# Patient Record
Sex: Female | Born: 1945 | Race: White | Hispanic: No | Marital: Married | Smoking: Never smoker
Health system: Southern US, Community
[De-identification: ages and names within clinical notes are randomized; demographics above are authoritative.]

## PROBLEM LIST (undated history)

## (undated) DIAGNOSIS — I5032 Chronic diastolic (congestive) heart failure: Secondary | ICD-10-CM

## (undated) DIAGNOSIS — N184 Chronic kidney disease, stage 4 (severe): Secondary | ICD-10-CM

## (undated) DIAGNOSIS — Q185 Microstomia: Secondary | ICD-10-CM

## (undated) DIAGNOSIS — I10 Essential (primary) hypertension: Secondary | ICD-10-CM

## (undated) DIAGNOSIS — R112 Nausea with vomiting, unspecified: Secondary | ICD-10-CM

## (undated) DIAGNOSIS — M8589 Other specified disorders of bone density and structure, multiple sites: Secondary | ICD-10-CM

## (undated) DIAGNOSIS — Z8679 Personal history of other diseases of the circulatory system: Secondary | ICD-10-CM

## (undated) DIAGNOSIS — Z9221 Personal history of antineoplastic chemotherapy: Secondary | ICD-10-CM

## (undated) DIAGNOSIS — F309 Manic episode, unspecified: Secondary | ICD-10-CM

## (undated) DIAGNOSIS — Z9889 Other specified postprocedural states: Secondary | ICD-10-CM

## (undated) DIAGNOSIS — D6959 Other secondary thrombocytopenia: Secondary | ICD-10-CM

## (undated) DIAGNOSIS — I2510 Atherosclerotic heart disease of native coronary artery without angina pectoris: Secondary | ICD-10-CM

## (undated) DIAGNOSIS — R569 Unspecified convulsions: Secondary | ICD-10-CM

## (undated) DIAGNOSIS — T8859XA Other complications of anesthesia, initial encounter: Secondary | ICD-10-CM

## (undated) DIAGNOSIS — C50919 Malignant neoplasm of unspecified site of unspecified female breast: Secondary | ICD-10-CM

## (undated) DIAGNOSIS — D649 Anemia, unspecified: Secondary | ICD-10-CM

## (undated) DIAGNOSIS — D509 Iron deficiency anemia, unspecified: Secondary | ICD-10-CM

## (undated) DIAGNOSIS — K219 Gastro-esophageal reflux disease without esophagitis: Secondary | ICD-10-CM

## (undated) DIAGNOSIS — R55 Syncope and collapse: Secondary | ICD-10-CM

## (undated) DIAGNOSIS — I252 Old myocardial infarction: Secondary | ICD-10-CM

## (undated) DIAGNOSIS — Z87442 Personal history of urinary calculi: Secondary | ICD-10-CM

## (undated) DIAGNOSIS — Z923 Personal history of irradiation: Secondary | ICD-10-CM

## (undated) DIAGNOSIS — R011 Cardiac murmur, unspecified: Secondary | ICD-10-CM

## (undated) DIAGNOSIS — I499 Cardiac arrhythmia, unspecified: Secondary | ICD-10-CM

## (undated) DIAGNOSIS — S42292A Other displaced fracture of upper end of left humerus, initial encounter for closed fracture: Secondary | ICD-10-CM

## (undated) DIAGNOSIS — E785 Hyperlipidemia, unspecified: Secondary | ICD-10-CM

## (undated) DIAGNOSIS — N179 Acute kidney failure, unspecified: Secondary | ICD-10-CM

## (undated) DIAGNOSIS — G620 Drug-induced polyneuropathy: Secondary | ICD-10-CM

## (undated) DIAGNOSIS — I6523 Occlusion and stenosis of bilateral carotid arteries: Secondary | ICD-10-CM

## (undated) DIAGNOSIS — C50211 Malignant neoplasm of upper-inner quadrant of right female breast: Principal | ICD-10-CM

## (undated) DIAGNOSIS — I5043 Acute on chronic combined systolic (congestive) and diastolic (congestive) heart failure: Secondary | ICD-10-CM

## (undated) DIAGNOSIS — I428 Other cardiomyopathies: Secondary | ICD-10-CM

## (undated) DIAGNOSIS — M869 Osteomyelitis, unspecified: Secondary | ICD-10-CM

## (undated) DIAGNOSIS — E119 Type 2 diabetes mellitus without complications: Secondary | ICD-10-CM

## (undated) DIAGNOSIS — L409 Psoriasis, unspecified: Secondary | ICD-10-CM

## (undated) DIAGNOSIS — Z9989 Dependence on other enabling machines and devices: Secondary | ICD-10-CM

## (undated) DIAGNOSIS — J45909 Unspecified asthma, uncomplicated: Secondary | ICD-10-CM

## (undated) DIAGNOSIS — N289 Disorder of kidney and ureter, unspecified: Secondary | ICD-10-CM

## (undated) DIAGNOSIS — E2740 Unspecified adrenocortical insufficiency: Secondary | ICD-10-CM

## (undated) DIAGNOSIS — J4540 Moderate persistent asthma, uncomplicated: Secondary | ICD-10-CM

## (undated) DIAGNOSIS — N200 Calculus of kidney: Secondary | ICD-10-CM

## (undated) DIAGNOSIS — T451X5A Adverse effect of antineoplastic and immunosuppressive drugs, initial encounter: Secondary | ICD-10-CM

## (undated) DIAGNOSIS — H903 Sensorineural hearing loss, bilateral: Secondary | ICD-10-CM

## (undated) DIAGNOSIS — I959 Hypotension, unspecified: Secondary | ICD-10-CM

## (undated) DIAGNOSIS — I471 Supraventricular tachycardia: Secondary | ICD-10-CM

## (undated) DIAGNOSIS — H409 Unspecified glaucoma: Secondary | ICD-10-CM

## (undated) DIAGNOSIS — I509 Heart failure, unspecified: Secondary | ICD-10-CM

## (undated) DIAGNOSIS — H9191 Unspecified hearing loss, right ear: Secondary | ICD-10-CM

## (undated) DIAGNOSIS — Z95828 Presence of other vascular implants and grafts: Secondary | ICD-10-CM

## (undated) DIAGNOSIS — I480 Paroxysmal atrial fibrillation: Secondary | ICD-10-CM

## (undated) DIAGNOSIS — J309 Allergic rhinitis, unspecified: Secondary | ICD-10-CM

## (undated) DIAGNOSIS — I639 Cerebral infarction, unspecified: Secondary | ICD-10-CM

## (undated) DIAGNOSIS — T4145XA Adverse effect of unspecified anesthetic, initial encounter: Secondary | ICD-10-CM

## (undated) DIAGNOSIS — Z952 Presence of prosthetic heart valve: Secondary | ICD-10-CM

## (undated) DIAGNOSIS — K8020 Calculus of gallbladder without cholecystitis without obstruction: Secondary | ICD-10-CM

## (undated) HISTORY — DX: Osteomyelitis, unspecified: M86.9

## (undated) HISTORY — DX: Chronic diastolic (congestive) heart failure: I50.32

## (undated) HISTORY — DX: Personal history of other diseases of the circulatory system: Z86.79

## (undated) HISTORY — DX: Manic episode, unspecified: F30.9

## (undated) HISTORY — DX: Malignant neoplasm of unspecified site of unspecified female breast: C50.919

## (undated) HISTORY — PX: TONSILLECTOMY: SUR1361

## (undated) HISTORY — DX: Anemia, unspecified: D64.9

## (undated) HISTORY — DX: Supraventricular tachycardia: I47.1

## (undated) HISTORY — DX: Malignant neoplasm of upper-inner quadrant of right female breast: C50.211

## (undated) HISTORY — DX: Essential (primary) hypertension: I10

## (undated) HISTORY — PX: CARPAL TUNNEL RELEASE: SHX101

## (undated) HISTORY — DX: Adverse effect of antineoplastic and immunosuppressive drugs, initial encounter: T45.1X5A

## (undated) HISTORY — DX: Unspecified glaucoma: H40.9

## (undated) HISTORY — DX: Other cardiomyopathies: I42.8

## (undated) HISTORY — DX: Other secondary thrombocytopenia: D69.59

## (undated) HISTORY — PX: EYE SURGERY: SHX253

## (undated) HISTORY — PX: CORONARY ARTERY BYPASS GRAFT: SHX141

## (undated) HISTORY — DX: Unspecified asthma, uncomplicated: J45.909

## (undated) HISTORY — DX: Hyperlipidemia, unspecified: E78.5

## (undated) HISTORY — DX: Drug-induced polyneuropathy: G62.0

## (undated) HISTORY — DX: Nausea with vomiting, unspecified: R11.2

## (undated) HISTORY — DX: Acute on chronic combined systolic (congestive) and diastolic (congestive) heart failure: I50.43

## (undated) HISTORY — DX: Presence of other vascular implants and grafts: Z95.828

## (undated) HISTORY — DX: Atherosclerotic heart disease of native coronary artery without angina pectoris: I25.10

## (undated) HISTORY — PX: ESOPHAGOGASTRODUODENOSCOPY: SHX1529

## (undated) HISTORY — PX: COLONOSCOPY: SHX174

## (undated) HISTORY — DX: Allergic rhinitis, unspecified: J30.9

## (undated) HISTORY — DX: Sensorineural hearing loss, bilateral: H90.3

## (undated) HISTORY — DX: Unspecified convulsions: R56.9

## (undated) HISTORY — DX: Other displaced fracture of upper end of left humerus, initial encounter for closed fracture: S42.292A

## (undated) HISTORY — DX: Acute kidney failure, unspecified: N17.9

## (undated) HISTORY — DX: Personal history of irradiation: Z92.3

## (undated) HISTORY — DX: Unspecified adrenocortical insufficiency: E27.40

## (undated) HISTORY — DX: Chronic kidney disease, stage 4 (severe): N18.4

## (undated) HISTORY — DX: Heart failure, unspecified: I50.9

## (undated) HISTORY — DX: Syncope and collapse: R55

## (undated) HISTORY — DX: Other specified disorders of bone density and structure, multiple sites: M85.89

## (undated) HISTORY — DX: Disorder of kidney and ureter, unspecified: N28.9

## (undated) HISTORY — DX: Presence of prosthetic heart valve: Z95.2

## (undated) HISTORY — DX: Calculus of gallbladder without cholecystitis without obstruction: K80.20

## (undated) HISTORY — DX: Iron deficiency anemia, unspecified: D50.9

---

## 1981-06-11 HISTORY — PX: TUBAL LIGATION: SHX77

## 1998-05-13 ENCOUNTER — Observation Stay (HOSPITAL_COMMUNITY): Admission: AD | Admit: 1998-05-13 | Discharge: 1998-05-14 | Payer: Self-pay | Admitting: Cardiology

## 1999-08-01 ENCOUNTER — Other Ambulatory Visit: Admission: RE | Admit: 1999-08-01 | Discharge: 1999-08-01 | Payer: Self-pay | Admitting: Obstetrics and Gynecology

## 1999-12-05 ENCOUNTER — Other Ambulatory Visit: Admission: RE | Admit: 1999-12-05 | Discharge: 1999-12-05 | Payer: Self-pay | Admitting: Obstetrics and Gynecology

## 2000-10-21 ENCOUNTER — Ambulatory Visit (HOSPITAL_COMMUNITY): Admission: RE | Admit: 2000-10-21 | Discharge: 2000-10-21 | Payer: Self-pay | Admitting: Gastroenterology

## 2000-11-26 ENCOUNTER — Other Ambulatory Visit: Admission: RE | Admit: 2000-11-26 | Discharge: 2000-11-26 | Payer: Self-pay | Admitting: Obstetrics and Gynecology

## 2001-01-30 ENCOUNTER — Encounter (INDEPENDENT_AMBULATORY_CARE_PROVIDER_SITE_OTHER): Payer: Self-pay | Admitting: Specialist

## 2001-01-30 ENCOUNTER — Ambulatory Visit (HOSPITAL_COMMUNITY): Admission: RE | Admit: 2001-01-30 | Discharge: 2001-01-30 | Payer: Self-pay | Admitting: Oncology

## 2001-03-20 ENCOUNTER — Encounter (HOSPITAL_COMMUNITY): Payer: Self-pay | Admitting: Oncology

## 2001-03-20 ENCOUNTER — Ambulatory Visit (HOSPITAL_COMMUNITY): Admission: RE | Admit: 2001-03-20 | Discharge: 2001-03-20 | Payer: Self-pay | Admitting: Oncology

## 2001-03-24 ENCOUNTER — Ambulatory Visit (HOSPITAL_COMMUNITY): Admission: RE | Admit: 2001-03-24 | Discharge: 2001-03-24 | Payer: Self-pay | Admitting: Oncology

## 2001-03-24 ENCOUNTER — Encounter (HOSPITAL_COMMUNITY): Payer: Self-pay | Admitting: Oncology

## 2001-04-02 ENCOUNTER — Encounter (HOSPITAL_COMMUNITY): Payer: Self-pay | Admitting: Oncology

## 2001-04-02 ENCOUNTER — Ambulatory Visit (HOSPITAL_COMMUNITY): Admission: RE | Admit: 2001-04-02 | Discharge: 2001-04-02 | Payer: Self-pay | Admitting: Oncology

## 2002-02-02 ENCOUNTER — Other Ambulatory Visit: Admission: RE | Admit: 2002-02-02 | Discharge: 2002-02-02 | Payer: Self-pay | Admitting: Obstetrics and Gynecology

## 2002-02-07 ENCOUNTER — Encounter: Payer: Self-pay | Admitting: Emergency Medicine

## 2002-02-07 ENCOUNTER — Emergency Department (HOSPITAL_COMMUNITY): Admission: EM | Admit: 2002-02-07 | Discharge: 2002-02-07 | Payer: Self-pay | Admitting: Emergency Medicine

## 2004-04-17 ENCOUNTER — Ambulatory Visit: Payer: Self-pay | Admitting: Oncology

## 2004-06-20 ENCOUNTER — Ambulatory Visit: Payer: Self-pay | Admitting: Oncology

## 2004-08-21 ENCOUNTER — Ambulatory Visit: Payer: Self-pay | Admitting: Oncology

## 2004-10-23 ENCOUNTER — Ambulatory Visit: Payer: Self-pay | Admitting: Oncology

## 2004-12-25 ENCOUNTER — Ambulatory Visit: Payer: Self-pay | Admitting: Oncology

## 2005-03-08 ENCOUNTER — Ambulatory Visit: Payer: Self-pay | Admitting: Oncology

## 2005-05-01 ENCOUNTER — Ambulatory Visit: Payer: Self-pay | Admitting: Oncology

## 2005-06-28 ENCOUNTER — Ambulatory Visit: Payer: Self-pay | Admitting: Oncology

## 2005-09-19 ENCOUNTER — Ambulatory Visit: Payer: Self-pay | Admitting: Oncology

## 2006-02-21 ENCOUNTER — Ambulatory Visit: Payer: Self-pay | Admitting: Oncology

## 2006-02-25 LAB — CBC WITH DIFFERENTIAL/PLATELET
BASO%: 0.6 % (ref 0.0–2.0)
Basophils Absolute: 0 10*3/uL (ref 0.0–0.1)
EOS%: 7.6 % — ABNORMAL HIGH (ref 0.0–7.0)
Eosinophils Absolute: 0.5 10*3/uL (ref 0.0–0.5)
HCT: 32.1 % — ABNORMAL LOW (ref 34.8–46.6)
HGB: 11.1 g/dL — ABNORMAL LOW (ref 11.6–15.9)
LYMPH%: 25 % (ref 14.0–48.0)
MCH: 31.2 pg (ref 26.0–34.0)
MCHC: 34.5 g/dL (ref 32.0–36.0)
MCV: 90.6 fL (ref 81.0–101.0)
MONO#: 0.6 10*3/uL (ref 0.1–0.9)
MONO%: 9.3 % (ref 0.0–13.0)
NEUT#: 3.9 10*3/uL (ref 1.5–6.5)
NEUT%: 57.5 % (ref 39.6–76.8)
Platelets: 193 10*3/uL (ref 145–400)
RBC: 3.54 10*6/uL — ABNORMAL LOW (ref 3.70–5.32)
RDW: 12.8 % (ref 11.3–14.5)
WBC: 6.8 10*3/uL (ref 3.9–10.0)
lymph#: 1.7 10*3/uL (ref 0.9–3.3)

## 2006-02-25 LAB — COMPREHENSIVE METABOLIC PANEL
ALT: 37 U/L (ref 0–40)
AST: 34 U/L (ref 0–37)
Albumin: 4 g/dL (ref 3.5–5.2)
Alkaline Phosphatase: 39 U/L (ref 39–117)
BUN: 22 mg/dL (ref 6–23)
CO2: 25 mEq/L (ref 19–32)
Calcium: 9.6 mg/dL (ref 8.4–10.5)
Chloride: 102 mEq/L (ref 96–112)
Creatinine, Ser: 1.23 mg/dL — ABNORMAL HIGH (ref 0.40–1.20)
Glucose, Bld: 141 mg/dL — ABNORMAL HIGH (ref 70–99)
Potassium: 4.8 mEq/L (ref 3.5–5.3)
Sodium: 138 mEq/L (ref 135–145)
Total Bilirubin: 0.5 mg/dL (ref 0.3–1.2)
Total Protein: 6.8 g/dL (ref 6.0–8.3)

## 2006-02-25 LAB — IRON AND TIBC
%SAT: 30 % (ref 20–55)
Iron: 94 ug/dL (ref 42–145)
TIBC: 310 ug/dL (ref 250–470)
UIBC: 216 ug/dL

## 2006-02-25 LAB — LACTATE DEHYDROGENASE: LDH: 148 U/L (ref 94–250)

## 2006-02-25 LAB — FERRITIN: Ferritin: 101 ng/mL (ref 10–291)

## 2006-04-18 ENCOUNTER — Ambulatory Visit: Payer: Self-pay | Admitting: Oncology

## 2006-04-22 LAB — CBC WITH DIFFERENTIAL/PLATELET
BASO%: 0.3 % (ref 0.0–2.0)
Basophils Absolute: 0 10*3/uL (ref 0.0–0.1)
EOS%: 7.1 % — ABNORMAL HIGH (ref 0.0–7.0)
Eosinophils Absolute: 0.4 10*3/uL (ref 0.0–0.5)
HCT: 32.8 % — ABNORMAL LOW (ref 34.8–46.6)
HGB: 11.2 g/dL — ABNORMAL LOW (ref 11.6–15.9)
LYMPH%: 22.7 % (ref 14.0–48.0)
MCH: 30.8 pg (ref 26.0–34.0)
MCHC: 34.2 g/dL (ref 32.0–36.0)
MCV: 90 fL (ref 81.0–101.0)
MONO#: 0.6 10*3/uL (ref 0.1–0.9)
MONO%: 10.9 % (ref 0.0–13.0)
NEUT#: 3.4 10*3/uL (ref 1.5–6.5)
NEUT%: 59 % (ref 39.6–76.8)
Platelets: 188 10*3/uL (ref 145–400)
RBC: 3.64 10*6/uL — ABNORMAL LOW (ref 3.70–5.32)
RDW: 12.8 % (ref 11.3–14.5)
WBC: 5.7 10*3/uL (ref 3.9–10.0)
lymph#: 1.3 10*3/uL (ref 0.9–3.3)

## 2006-05-20 LAB — CBC WITH DIFFERENTIAL/PLATELET
BASO%: 0.6 % (ref 0.0–2.0)
Basophils Absolute: 0 10*3/uL (ref 0.0–0.1)
EOS%: 6.9 % (ref 0.0–7.0)
Eosinophils Absolute: 0.4 10*3/uL (ref 0.0–0.5)
HCT: 34.5 % — ABNORMAL LOW (ref 34.8–46.6)
HGB: 11.7 g/dL (ref 11.6–15.9)
LYMPH%: 26.7 % (ref 14.0–48.0)
MCH: 30.7 pg (ref 26.0–34.0)
MCHC: 33.8 g/dL (ref 32.0–36.0)
MCV: 90.9 fL (ref 81.0–101.0)
MONO#: 0.6 10*3/uL (ref 0.1–0.9)
MONO%: 9 % (ref 0.0–13.0)
NEUT#: 3.5 10*3/uL (ref 1.5–6.5)
NEUT%: 56.8 % (ref 39.6–76.8)
Platelets: 198 10*3/uL (ref 145–400)
RBC: 3.8 10*6/uL (ref 3.70–5.32)
RDW: 13.2 % (ref 11.3–14.5)
WBC: 6.2 10*3/uL (ref 3.9–10.0)
lymph#: 1.6 10*3/uL (ref 0.9–3.3)

## 2006-06-12 ENCOUNTER — Ambulatory Visit: Payer: Self-pay | Admitting: Oncology

## 2006-06-17 LAB — CBC WITH DIFFERENTIAL/PLATELET
BASO%: 2.1 % — ABNORMAL HIGH (ref 0.0–2.0)
Basophils Absolute: 0.1 10*3/uL (ref 0.0–0.1)
EOS%: 8.4 % — ABNORMAL HIGH (ref 0.0–7.0)
Eosinophils Absolute: 0.6 10*3/uL — ABNORMAL HIGH (ref 0.0–0.5)
HCT: 35.5 % (ref 34.8–46.6)
HGB: 12.1 g/dL (ref 11.6–15.9)
LYMPH%: 22.1 % (ref 14.0–48.0)
MCH: 30 pg (ref 26.0–34.0)
MCHC: 34.2 g/dL (ref 32.0–36.0)
MCV: 87.9 fL (ref 81.0–101.0)
MONO#: 0.7 10*3/uL (ref 0.1–0.9)
MONO%: 9.6 % (ref 0.0–13.0)
NEUT#: 4.1 10*3/uL (ref 1.5–6.5)
NEUT%: 57.8 % (ref 39.6–76.8)
Platelets: 196 10*3/uL (ref 145–400)
RBC: 4.04 10*6/uL (ref 3.70–5.32)
RDW: 11.6 % (ref 11.3–14.5)
WBC: 7.1 10*3/uL (ref 3.9–10.0)
lymph#: 1.6 10*3/uL (ref 0.9–3.3)

## 2006-07-15 LAB — CBC WITH DIFFERENTIAL/PLATELET
BASO%: 0.7 % (ref 0.0–2.0)
Basophils Absolute: 0 10*3/uL (ref 0.0–0.1)
EOS%: 8.2 % — ABNORMAL HIGH (ref 0.0–7.0)
Eosinophils Absolute: 0.4 10*3/uL (ref 0.0–0.5)
HCT: 33.2 % — ABNORMAL LOW (ref 34.8–46.6)
HGB: 11.6 g/dL (ref 11.6–15.9)
LYMPH%: 25.1 % (ref 14.0–48.0)
MCH: 31.2 pg (ref 26.0–34.0)
MCHC: 34.9 g/dL (ref 32.0–36.0)
MCV: 89.4 fL (ref 81.0–101.0)
MONO#: 0.5 10*3/uL (ref 0.1–0.9)
MONO%: 9.5 % (ref 0.0–13.0)
NEUT#: 2.9 10*3/uL (ref 1.5–6.5)
NEUT%: 56.5 % (ref 39.6–76.8)
Platelets: 172 10*3/uL (ref 145–400)
RBC: 3.72 10*6/uL (ref 3.70–5.32)
RDW: 13.3 % (ref 11.3–14.5)
WBC: 5.2 10*3/uL (ref 3.9–10.0)
lymph#: 1.3 10*3/uL (ref 0.9–3.3)

## 2006-08-07 ENCOUNTER — Ambulatory Visit: Payer: Self-pay | Admitting: Oncology

## 2006-08-27 LAB — COMPREHENSIVE METABOLIC PANEL
ALT: 46 U/L — ABNORMAL HIGH (ref 0–35)
AST: 34 U/L (ref 0–37)
Albumin: 3.9 g/dL (ref 3.5–5.2)
Alkaline Phosphatase: 36 U/L — ABNORMAL LOW (ref 39–117)
BUN: 23 mg/dL (ref 6–23)
CO2: 25 mEq/L (ref 19–32)
Calcium: 9.5 mg/dL (ref 8.4–10.5)
Chloride: 103 mEq/L (ref 96–112)
Creatinine, Ser: 1.21 mg/dL — ABNORMAL HIGH (ref 0.40–1.20)
Glucose, Bld: 217 mg/dL — ABNORMAL HIGH (ref 70–99)
Potassium: 4.6 mEq/L (ref 3.5–5.3)
Sodium: 138 mEq/L (ref 135–145)
Total Bilirubin: 0.5 mg/dL (ref 0.3–1.2)
Total Protein: 6.6 g/dL (ref 6.0–8.3)

## 2006-08-27 LAB — IRON AND TIBC
%SAT: 31 % (ref 20–55)
Iron: 107 ug/dL (ref 42–145)
TIBC: 346 ug/dL (ref 250–470)
UIBC: 239 ug/dL

## 2006-08-27 LAB — LACTATE DEHYDROGENASE: LDH: 167 U/L (ref 94–250)

## 2006-08-27 LAB — CBC WITH DIFFERENTIAL/PLATELET
BASO%: 0.4 % (ref 0.0–2.0)
Basophils Absolute: 0 10*3/uL (ref 0.0–0.1)
EOS%: 6.9 % (ref 0.0–7.0)
Eosinophils Absolute: 0.5 10*3/uL (ref 0.0–0.5)
HCT: 32.4 % — ABNORMAL LOW (ref 34.8–46.6)
HGB: 11.4 g/dL — ABNORMAL LOW (ref 11.6–15.9)
LYMPH%: 22.4 % (ref 14.0–48.0)
MCH: 31.1 pg (ref 26.0–34.0)
MCHC: 35 g/dL (ref 32.0–36.0)
MCV: 88.6 fL (ref 81.0–101.0)
MONO#: 0.6 10*3/uL (ref 0.1–0.9)
MONO%: 9 % (ref 0.0–13.0)
NEUT#: 4.1 10*3/uL (ref 1.5–6.5)
NEUT%: 61.3 % (ref 39.6–76.8)
Platelets: 176 10*3/uL (ref 145–400)
RBC: 3.65 10*6/uL — ABNORMAL LOW (ref 3.70–5.32)
RDW: 13.1 % (ref 11.3–14.5)
WBC: 6.6 10*3/uL (ref 3.9–10.0)
lymph#: 1.5 10*3/uL (ref 0.9–3.3)

## 2006-08-27 LAB — FERRITIN: Ferritin: 162 ng/mL (ref 10–291)

## 2006-09-19 ENCOUNTER — Ambulatory Visit: Payer: Self-pay | Admitting: Oncology

## 2006-10-28 LAB — CBC WITH DIFFERENTIAL/PLATELET
BASO%: 1.7 % (ref 0.0–2.0)
Basophils Absolute: 0.1 10*3/uL (ref 0.0–0.1)
EOS%: 7.1 % — ABNORMAL HIGH (ref 0.0–7.0)
Eosinophils Absolute: 0.3 10*3/uL (ref 0.0–0.5)
HCT: 32.8 % — ABNORMAL LOW (ref 34.8–46.6)
HGB: 11.3 g/dL — ABNORMAL LOW (ref 11.6–15.9)
LYMPH%: 26.2 % (ref 14.0–48.0)
MCH: 30.5 pg (ref 26.0–34.0)
MCHC: 34.4 g/dL (ref 32.0–36.0)
MCV: 88.8 fL (ref 81.0–101.0)
MONO#: 0.5 10*3/uL (ref 0.1–0.9)
MONO%: 9.7 % (ref 0.0–13.0)
NEUT#: 2.7 10*3/uL (ref 1.5–6.5)
NEUT%: 55.2 % (ref 39.6–76.8)
Platelets: 195 10*3/uL (ref 145–400)
RBC: 3.69 10*6/uL — ABNORMAL LOW (ref 3.70–5.32)
RDW: 11.4 % (ref 11.3–14.5)
WBC: 4.9 10*3/uL (ref 3.9–10.0)
lymph#: 1.3 10*3/uL (ref 0.9–3.3)

## 2006-11-14 ENCOUNTER — Ambulatory Visit: Payer: Self-pay | Admitting: Oncology

## 2006-11-19 LAB — CBC WITH DIFFERENTIAL/PLATELET
BASO%: 0.2 % (ref 0.0–2.0)
Basophils Absolute: 0 10*3/uL (ref 0.0–0.1)
EOS%: 7.6 % — ABNORMAL HIGH (ref 0.0–7.0)
Eosinophils Absolute: 0.4 10*3/uL (ref 0.0–0.5)
HCT: 33.7 % — ABNORMAL LOW (ref 34.8–46.6)
HGB: 11.8 g/dL (ref 11.6–15.9)
LYMPH%: 26.4 % (ref 14.0–48.0)
MCH: 30.9 pg (ref 26.0–34.0)
MCHC: 34.9 g/dL (ref 32.0–36.0)
MCV: 88.6 fL (ref 81.0–101.0)
MONO#: 0.5 10*3/uL (ref 0.1–0.9)
MONO%: 9.6 % (ref 0.0–13.0)
NEUT#: 3.1 10*3/uL (ref 1.5–6.5)
NEUT%: 56.2 % (ref 39.6–76.8)
Platelets: 198 10*3/uL (ref 145–400)
RBC: 3.8 10*6/uL (ref 3.70–5.32)
RDW: 13.4 % (ref 11.3–14.5)
WBC: 5.4 10*3/uL (ref 3.9–10.0)
lymph#: 1.4 10*3/uL (ref 0.9–3.3)

## 2006-12-17 LAB — CBC WITH DIFFERENTIAL/PLATELET
BASO%: 1.9 % (ref 0.0–2.0)
Basophils Absolute: 0.1 10*3/uL (ref 0.0–0.1)
EOS%: 8.6 % — ABNORMAL HIGH (ref 0.0–7.0)
Eosinophils Absolute: 0.5 10*3/uL (ref 0.0–0.5)
HCT: 35.5 % (ref 34.8–46.6)
HGB: 12.6 g/dL (ref 11.6–15.9)
LYMPH%: 26.5 % (ref 14.0–48.0)
MCH: 30.7 pg (ref 26.0–34.0)
MCHC: 35.5 g/dL (ref 32.0–36.0)
MCV: 86.4 fL (ref 81.0–101.0)
MONO#: 0.7 10*3/uL (ref 0.1–0.9)
MONO%: 11.1 % (ref 0.0–13.0)
NEUT#: 3.2 10*3/uL (ref 1.5–6.5)
NEUT%: 52 % (ref 39.6–76.8)
Platelets: 223 10*3/uL (ref 145–400)
RBC: 4.11 10*6/uL (ref 3.70–5.32)
RDW: 11.1 % — ABNORMAL LOW (ref 11.3–14.5)
WBC: 6.2 10*3/uL (ref 3.9–10.0)
lymph#: 1.6 10*3/uL (ref 0.9–3.3)

## 2007-01-10 ENCOUNTER — Ambulatory Visit: Payer: Self-pay | Admitting: Oncology

## 2007-02-25 ENCOUNTER — Ambulatory Visit: Payer: Self-pay | Admitting: Oncology

## 2007-02-25 LAB — COMPREHENSIVE METABOLIC PANEL
ALT: 48 U/L — ABNORMAL HIGH (ref 0–35)
AST: 44 U/L — ABNORMAL HIGH (ref 0–37)
Albumin: 4 g/dL (ref 3.5–5.2)
Alkaline Phosphatase: 40 U/L (ref 39–117)
BUN: 24 mg/dL — ABNORMAL HIGH (ref 6–23)
CO2: 26 mEq/L (ref 19–32)
Calcium: 9.4 mg/dL (ref 8.4–10.5)
Chloride: 98 mEq/L (ref 96–112)
Creatinine, Ser: 1.32 mg/dL — ABNORMAL HIGH (ref 0.40–1.20)
Glucose, Bld: 255 mg/dL — ABNORMAL HIGH (ref 70–99)
Potassium: 4.4 mEq/L (ref 3.5–5.3)
Sodium: 135 mEq/L (ref 135–145)
Total Bilirubin: 0.5 mg/dL (ref 0.3–1.2)
Total Protein: 6.9 g/dL (ref 6.0–8.3)

## 2007-02-25 LAB — CBC WITH DIFFERENTIAL/PLATELET
BASO%: 0.7 % (ref 0.0–2.0)
Basophils Absolute: 0 10*3/uL (ref 0.0–0.1)
EOS%: 7.7 % — ABNORMAL HIGH (ref 0.0–7.0)
Eosinophils Absolute: 0.4 10*3/uL (ref 0.0–0.5)
HCT: 32.9 % — ABNORMAL LOW (ref 34.8–46.6)
HGB: 11.6 g/dL (ref 11.6–15.9)
LYMPH%: 23.3 % (ref 14.0–48.0)
MCH: 31.2 pg (ref 26.0–34.0)
MCHC: 35.1 g/dL (ref 32.0–36.0)
MCV: 88.9 fL (ref 81.0–101.0)
MONO#: 0.3 10*3/uL (ref 0.1–0.9)
MONO%: 6.3 % (ref 0.0–13.0)
NEUT#: 3.4 10*3/uL (ref 1.5–6.5)
NEUT%: 62 % (ref 39.6–76.8)
Platelets: 187 10*3/uL (ref 145–400)
RBC: 3.7 10*6/uL (ref 3.70–5.32)
RDW: 13 % (ref 11.3–14.5)
WBC: 5.5 10*3/uL (ref 3.9–10.0)
lymph#: 1.3 10*3/uL (ref 0.9–3.3)

## 2007-02-25 LAB — IRON AND TIBC
%SAT: 30 % (ref 20–55)
Iron: 99 ug/dL (ref 42–145)
TIBC: 332 ug/dL (ref 250–470)
UIBC: 233 ug/dL

## 2007-02-25 LAB — LACTATE DEHYDROGENASE: LDH: 163 U/L (ref 94–250)

## 2007-02-25 LAB — FERRITIN: Ferritin: 229 ng/mL (ref 10–291)

## 2007-04-04 ENCOUNTER — Ambulatory Visit: Payer: Self-pay | Admitting: Oncology

## 2007-05-06 LAB — CBC WITH DIFFERENTIAL/PLATELET
BASO%: 0.5 % (ref 0.0–2.0)
Basophils Absolute: 0 10*3/uL (ref 0.0–0.1)
EOS%: 7.1 % — ABNORMAL HIGH (ref 0.0–7.0)
Eosinophils Absolute: 0.5 10*3/uL (ref 0.0–0.5)
HCT: 33.5 % — ABNORMAL LOW (ref 34.8–46.6)
HGB: 11.9 g/dL (ref 11.6–15.9)
LYMPH%: 26.5 % (ref 14.0–48.0)
MCH: 31.4 pg (ref 26.0–34.0)
MCHC: 35.5 g/dL (ref 32.0–36.0)
MCV: 88.4 fL (ref 81.0–101.0)
MONO#: 0.7 10*3/uL (ref 0.1–0.9)
MONO%: 10.8 % (ref 0.0–13.0)
NEUT#: 3.6 10*3/uL (ref 1.5–6.5)
NEUT%: 55.1 % (ref 39.6–76.8)
Platelets: 220 10*3/uL (ref 145–400)
RBC: 3.79 10*6/uL (ref 3.70–5.32)
RDW: 12.7 % (ref 11.3–14.5)
WBC: 6.5 10*3/uL (ref 3.9–10.0)
lymph#: 1.7 10*3/uL (ref 0.9–3.3)

## 2007-06-12 HISTORY — PX: AORTIC VALVE REPLACEMENT: SHX41

## 2007-06-27 ENCOUNTER — Ambulatory Visit: Payer: Self-pay | Admitting: Oncology

## 2007-07-03 LAB — CBC WITH DIFFERENTIAL/PLATELET
BASO%: 2.2 % — ABNORMAL HIGH (ref 0.0–2.0)
Basophils Absolute: 0.1 10*3/uL (ref 0.0–0.1)
EOS%: 9.9 % — ABNORMAL HIGH (ref 0.0–7.0)
Eosinophils Absolute: 0.6 10*3/uL — ABNORMAL HIGH (ref 0.0–0.5)
HCT: 32.3 % — ABNORMAL LOW (ref 34.8–46.6)
HGB: 11.5 g/dL — ABNORMAL LOW (ref 11.6–15.9)
LYMPH%: 21.4 % (ref 14.0–48.0)
MCH: 30.9 pg (ref 26.0–34.0)
MCHC: 35.5 g/dL (ref 32.0–36.0)
MCV: 87.1 fL (ref 81.0–101.0)
MONO#: 0.6 10*3/uL (ref 0.1–0.9)
MONO%: 9.5 % (ref 0.0–13.0)
NEUT#: 3.5 10*3/uL (ref 1.5–6.5)
NEUT%: 57 % (ref 39.6–76.8)
Platelets: 194 10*3/uL (ref 145–400)
RBC: 3.71 10*6/uL (ref 3.70–5.32)
RDW: 10.7 % — ABNORMAL LOW (ref 11.3–14.5)
WBC: 6.1 10*3/uL (ref 3.9–10.0)
lymph#: 1.3 10*3/uL (ref 0.9–3.3)

## 2007-08-22 ENCOUNTER — Ambulatory Visit: Payer: Self-pay | Admitting: Oncology

## 2007-09-16 LAB — CBC WITH DIFFERENTIAL/PLATELET
BASO%: 1.1 % (ref 0.0–2.0)
Basophils Absolute: 0.1 10*3/uL (ref 0.0–0.1)
EOS%: 6.1 % (ref 0.0–7.0)
Eosinophils Absolute: 0.4 10*3/uL (ref 0.0–0.5)
HCT: 31 % — ABNORMAL LOW (ref 34.8–46.6)
HGB: 11 g/dL — ABNORMAL LOW (ref 11.6–15.9)
LYMPH%: 25.6 % (ref 14.0–48.0)
MCH: 31.5 pg (ref 26.0–34.0)
MCHC: 35.6 g/dL (ref 32.0–36.0)
MCV: 88.4 fL (ref 81.0–101.0)
MONO#: 0.6 10*3/uL (ref 0.1–0.9)
MONO%: 8.8 % (ref 0.0–13.0)
NEUT#: 3.9 10*3/uL (ref 1.5–6.5)
NEUT%: 58.4 % (ref 39.6–76.8)
Platelets: 197 10*3/uL (ref 145–400)
RBC: 3.5 10*6/uL — ABNORMAL LOW (ref 3.70–5.32)
RDW: 13.1 % (ref 11.3–14.5)
WBC: 6.7 10*3/uL (ref 3.9–10.0)
lymph#: 1.7 10*3/uL (ref 0.9–3.3)

## 2007-09-16 LAB — COMPREHENSIVE METABOLIC PANEL
ALT: 42 U/L — ABNORMAL HIGH (ref 0–35)
AST: 38 U/L — ABNORMAL HIGH (ref 0–37)
Albumin: 3.9 g/dL (ref 3.5–5.2)
Alkaline Phosphatase: 36 U/L — ABNORMAL LOW (ref 39–117)
BUN: 23 mg/dL (ref 6–23)
CO2: 26 mEq/L (ref 19–32)
Calcium: 9.2 mg/dL (ref 8.4–10.5)
Chloride: 99 mEq/L (ref 96–112)
Creatinine, Ser: 1.16 mg/dL (ref 0.40–1.20)
Glucose, Bld: 233 mg/dL — ABNORMAL HIGH (ref 70–99)
Potassium: 4.9 mEq/L (ref 3.5–5.3)
Sodium: 135 mEq/L (ref 135–145)
Total Bilirubin: 0.4 mg/dL (ref 0.3–1.2)
Total Protein: 6.6 g/dL (ref 6.0–8.3)

## 2007-09-16 LAB — FERRITIN: Ferritin: 161 ng/mL (ref 10–291)

## 2007-09-16 LAB — IRON AND TIBC
%SAT: 32 % (ref 20–55)
Iron: 105 ug/dL (ref 42–145)
TIBC: 326 ug/dL (ref 250–470)
UIBC: 221 ug/dL

## 2007-09-16 LAB — LACTATE DEHYDROGENASE: LDH: 173 U/L (ref 94–250)

## 2007-10-17 ENCOUNTER — Ambulatory Visit: Payer: Self-pay | Admitting: Oncology

## 2007-10-21 LAB — CBC WITH DIFFERENTIAL/PLATELET
BASO%: 1.6 % (ref 0.0–2.0)
Basophils Absolute: 0.1 10*3/uL (ref 0.0–0.1)
EOS%: 7.9 % — ABNORMAL HIGH (ref 0.0–7.0)
Eosinophils Absolute: 0.6 10*3/uL — ABNORMAL HIGH (ref 0.0–0.5)
HCT: 34.4 % — ABNORMAL LOW (ref 34.8–46.6)
HGB: 11.7 g/dL (ref 11.6–15.9)
LYMPH%: 19.4 % (ref 14.0–48.0)
MCH: 30.4 pg (ref 26.0–34.0)
MCHC: 33.9 g/dL (ref 32.0–36.0)
MCV: 89.7 fL (ref 81.0–101.0)
MONO#: 0.6 10*3/uL (ref 0.1–0.9)
MONO%: 7.6 % (ref 0.0–13.0)
NEUT#: 5.1 10*3/uL (ref 1.5–6.5)
NEUT%: 63.5 % (ref 39.6–76.8)
Platelets: 186 10*3/uL (ref 145–400)
RBC: 3.84 10*6/uL (ref 3.70–5.32)
RDW: 12.5 % (ref 11.3–14.5)
WBC: 8 10*3/uL (ref 3.9–10.0)
lymph#: 1.5 10*3/uL (ref 0.9–3.3)

## 2007-11-18 LAB — CBC WITH DIFFERENTIAL/PLATELET
BASO%: 2.2 % — ABNORMAL HIGH (ref 0.0–2.0)
Basophils Absolute: 0.1 10*3/uL (ref 0.0–0.1)
EOS%: 5.1 % (ref 0.0–7.0)
Eosinophils Absolute: 0.3 10*3/uL (ref 0.0–0.5)
HCT: 35.2 % (ref 34.8–46.6)
HGB: 12.2 g/dL (ref 11.6–15.9)
LYMPH%: 36.1 % (ref 14.0–48.0)
MCH: 30.8 pg (ref 26.0–34.0)
MCHC: 34.6 g/dL (ref 32.0–36.0)
MCV: 89.1 fL (ref 81.0–101.0)
MONO#: 0.6 10*3/uL (ref 0.1–0.9)
MONO%: 8.9 % (ref 0.0–13.0)
NEUT#: 3.1 10*3/uL (ref 1.5–6.5)
NEUT%: 47.7 % (ref 39.6–76.8)
Platelets: 201 10*3/uL (ref 145–400)
RBC: 3.94 10*6/uL (ref 3.70–5.32)
RDW: 12.5 % (ref 11.3–14.5)
WBC: 6.5 10*3/uL (ref 3.9–10.0)
lymph#: 2.3 10*3/uL (ref 0.9–3.3)

## 2007-12-11 ENCOUNTER — Ambulatory Visit: Payer: Self-pay | Admitting: Oncology

## 2007-12-16 LAB — CBC WITH DIFFERENTIAL/PLATELET
BASO%: 1.8 % (ref 0.0–2.0)
Basophils Absolute: 0.1 10*3/uL (ref 0.0–0.1)
EOS%: 7.8 % — ABNORMAL HIGH (ref 0.0–7.0)
Eosinophils Absolute: 0.5 10*3/uL (ref 0.0–0.5)
HCT: 35.1 % (ref 34.8–46.6)
HGB: 12.1 g/dL (ref 11.6–15.9)
LYMPH%: 30.4 % (ref 14.0–48.0)
MCH: 30.7 pg (ref 26.0–34.0)
MCHC: 34.4 g/dL (ref 32.0–36.0)
MCV: 89.4 fL (ref 81.0–101.0)
MONO#: 0.6 10*3/uL (ref 0.1–0.9)
MONO%: 9.1 % (ref 0.0–13.0)
NEUT#: 3.3 10*3/uL (ref 1.5–6.5)
NEUT%: 50.9 % (ref 39.6–76.8)
Platelets: 217 10*3/uL (ref 145–400)
RBC: 3.93 10*6/uL (ref 3.70–5.32)
RDW: 12.4 % (ref 11.3–14.5)
WBC: 6.6 10*3/uL (ref 3.9–10.0)
lymph#: 2 10*3/uL (ref 0.9–3.3)

## 2008-01-09 ENCOUNTER — Inpatient Hospital Stay (HOSPITAL_COMMUNITY): Admission: AD | Admit: 2008-01-09 | Discharge: 2008-01-11 | Payer: Self-pay | Admitting: Cardiology

## 2008-01-20 ENCOUNTER — Ambulatory Visit (HOSPITAL_COMMUNITY): Admission: RE | Admit: 2008-01-20 | Discharge: 2008-01-20 | Payer: Self-pay | Admitting: Cardiology

## 2008-02-17 ENCOUNTER — Ambulatory Visit: Payer: Self-pay | Admitting: Oncology

## 2008-02-19 LAB — CBC WITH DIFFERENTIAL/PLATELET
BASO%: 0.4 % (ref 0.0–2.0)
Basophils Absolute: 0 10*3/uL (ref 0.0–0.1)
EOS%: 10.6 % — ABNORMAL HIGH (ref 0.0–7.0)
Eosinophils Absolute: 0.5 10*3/uL (ref 0.0–0.5)
HCT: 31.7 % — ABNORMAL LOW (ref 34.8–46.6)
HGB: 10.8 g/dL — ABNORMAL LOW (ref 11.6–15.9)
LYMPH%: 26.3 % (ref 14.0–48.0)
MCH: 31.3 pg (ref 26.0–34.0)
MCHC: 33.9 g/dL (ref 32.0–36.0)
MCV: 92.4 fL (ref 81.0–101.0)
MONO#: 0.7 10*3/uL (ref 0.1–0.9)
MONO%: 13.6 % — ABNORMAL HIGH (ref 0.0–13.0)
NEUT#: 2.5 10*3/uL (ref 1.5–6.5)
NEUT%: 49.1 % (ref 39.6–76.8)
Platelets: 191 10*3/uL (ref 145–400)
RBC: 3.43 10*6/uL — ABNORMAL LOW (ref 3.70–5.32)
RDW: 13.4 % (ref 11.3–14.5)
WBC: 5 10*3/uL (ref 3.9–10.0)
lymph#: 1.3 10*3/uL (ref 0.9–3.3)

## 2008-03-11 ENCOUNTER — Encounter: Payer: Self-pay | Admitting: Cardiology

## 2008-03-23 ENCOUNTER — Inpatient Hospital Stay (HOSPITAL_BASED_OUTPATIENT_CLINIC_OR_DEPARTMENT_OTHER): Admission: RE | Admit: 2008-03-23 | Discharge: 2008-03-23 | Payer: Self-pay | Admitting: Cardiology

## 2008-03-23 HISTORY — PX: CARDIAC CATHETERIZATION: SHX172

## 2008-03-30 ENCOUNTER — Ambulatory Visit: Payer: Self-pay | Admitting: Surgery

## 2008-04-01 ENCOUNTER — Ambulatory Visit (HOSPITAL_COMMUNITY): Admission: RE | Admit: 2008-04-01 | Discharge: 2008-04-01 | Payer: Self-pay | Admitting: Psychiatry

## 2008-04-01 ENCOUNTER — Ambulatory Visit: Payer: Self-pay | Admitting: Surgery

## 2008-04-05 ENCOUNTER — Inpatient Hospital Stay (HOSPITAL_COMMUNITY): Admission: RE | Admit: 2008-04-05 | Discharge: 2008-04-17 | Payer: Self-pay | Admitting: Surgery

## 2008-04-05 ENCOUNTER — Ambulatory Visit: Payer: Self-pay | Admitting: Surgery

## 2008-04-05 ENCOUNTER — Encounter: Payer: Self-pay | Admitting: Surgery

## 2008-04-16 ENCOUNTER — Encounter: Payer: Self-pay | Admitting: Surgery

## 2008-04-27 ENCOUNTER — Encounter: Admission: RE | Admit: 2008-04-27 | Discharge: 2008-04-27 | Payer: Self-pay | Admitting: Surgery

## 2008-04-27 ENCOUNTER — Ambulatory Visit: Payer: Self-pay | Admitting: Surgery

## 2008-05-04 ENCOUNTER — Ambulatory Visit: Payer: Self-pay | Admitting: Oncology

## 2008-05-04 LAB — FERRITIN: Ferritin: 196 ng/mL (ref 10–291)

## 2008-05-04 LAB — COMPREHENSIVE METABOLIC PANEL
ALT: 17 U/L (ref 0–35)
AST: 18 U/L (ref 0–37)
Albumin: 3.5 g/dL (ref 3.5–5.2)
Alkaline Phosphatase: 65 U/L (ref 39–117)
BUN: 18 mg/dL (ref 6–23)
CO2: 24 mEq/L (ref 19–32)
Calcium: 9.1 mg/dL (ref 8.4–10.5)
Chloride: 102 mEq/L (ref 96–112)
Creatinine, Ser: 1.07 mg/dL (ref 0.40–1.20)
Glucose, Bld: 162 mg/dL — ABNORMAL HIGH (ref 70–99)
Potassium: 4.5 mEq/L (ref 3.5–5.3)
Sodium: 139 mEq/L (ref 135–145)
Total Bilirubin: 0.8 mg/dL (ref 0.3–1.2)
Total Protein: 6.5 g/dL (ref 6.0–8.3)

## 2008-05-04 LAB — CBC WITH DIFFERENTIAL/PLATELET
BASO%: 1 % (ref 0.0–2.0)
Basophils Absolute: 0 10*3/uL (ref 0.0–0.1)
EOS%: 6.4 % (ref 0.0–7.0)
Eosinophils Absolute: 0.3 10*3/uL (ref 0.0–0.5)
HCT: 28.1 % — ABNORMAL LOW (ref 34.8–46.6)
HGB: 9.5 g/dL — ABNORMAL LOW (ref 11.6–15.9)
LYMPH%: 13.6 % — ABNORMAL LOW (ref 14.0–48.0)
MCH: 29.9 pg (ref 26.0–34.0)
MCHC: 33.8 g/dL (ref 32.0–36.0)
MCV: 88.4 fL (ref 81.0–101.0)
MONO#: 0.5 10*3/uL (ref 0.1–0.9)
MONO%: 10.1 % (ref 0.0–13.0)
NEUT#: 3.2 10*3/uL (ref 1.5–6.5)
NEUT%: 68.9 % (ref 39.6–76.8)
Platelets: 152 10*3/uL (ref 145–400)
RBC: 3.17 10*6/uL — ABNORMAL LOW (ref 3.70–5.32)
RDW: 15 % — ABNORMAL HIGH (ref 11.3–14.5)
WBC: 4.6 10*3/uL (ref 3.9–10.0)
lymph#: 0.6 10*3/uL — ABNORMAL LOW (ref 0.9–3.3)

## 2008-05-04 LAB — LACTATE DEHYDROGENASE: LDH: 268 U/L — ABNORMAL HIGH (ref 94–250)

## 2008-05-04 LAB — IRON AND TIBC
%SAT: 18 % — ABNORMAL LOW (ref 20–55)
Iron: 45 ug/dL (ref 42–145)
TIBC: 253 ug/dL (ref 250–470)
UIBC: 208 ug/dL

## 2008-05-11 ENCOUNTER — Ambulatory Visit: Payer: Self-pay | Admitting: Surgery

## 2008-05-13 ENCOUNTER — Encounter (HOSPITAL_COMMUNITY): Admission: RE | Admit: 2008-05-13 | Discharge: 2008-06-09 | Payer: Self-pay | Admitting: Cardiology

## 2008-05-14 ENCOUNTER — Emergency Department (HOSPITAL_BASED_OUTPATIENT_CLINIC_OR_DEPARTMENT_OTHER): Admission: EM | Admit: 2008-05-14 | Discharge: 2008-05-14 | Payer: Self-pay | Admitting: Emergency Medicine

## 2008-06-01 LAB — CBC WITH DIFFERENTIAL/PLATELET
BASO%: 0.3 % (ref 0.0–2.0)
Basophils Absolute: 0 10*3/uL (ref 0.0–0.1)
EOS%: 5.6 % (ref 0.0–7.0)
Eosinophils Absolute: 0.4 10*3/uL (ref 0.0–0.5)
HCT: 36.3 % (ref 34.8–46.6)
HGB: 12.1 g/dL (ref 11.6–15.9)
LYMPH%: 15.7 % (ref 14.0–48.0)
MCH: 28.7 pg (ref 26.0–34.0)
MCHC: 33.2 g/dL (ref 32.0–36.0)
MCV: 86.4 fL (ref 81.0–101.0)
MONO#: 0.8 10*3/uL (ref 0.1–0.9)
MONO%: 12.3 % (ref 0.0–13.0)
NEUT#: 4.2 10*3/uL (ref 1.5–6.5)
NEUT%: 66.1 % (ref 39.6–76.8)
Platelets: 162 10*3/uL (ref 145–400)
RBC: 4.2 10*6/uL (ref 3.70–5.32)
RDW: 15.3 % — ABNORMAL HIGH (ref 11.3–14.5)
WBC: 6.3 10*3/uL (ref 3.9–10.0)
lymph#: 1 10*3/uL (ref 0.9–3.3)

## 2008-06-01 LAB — IRON AND TIBC
%SAT: 18 % — ABNORMAL LOW (ref 20–55)
Iron: 49 ug/dL (ref 42–145)
TIBC: 274 ug/dL (ref 250–470)
UIBC: 225 ug/dL

## 2008-06-01 LAB — FERRITIN: Ferritin: 90 ng/mL (ref 10–291)

## 2008-06-15 ENCOUNTER — Encounter (HOSPITAL_COMMUNITY): Admission: RE | Admit: 2008-06-15 | Discharge: 2008-09-13 | Payer: Self-pay | Admitting: Cardiology

## 2008-06-25 ENCOUNTER — Ambulatory Visit: Payer: Self-pay | Admitting: Oncology

## 2008-08-20 ENCOUNTER — Ambulatory Visit: Payer: Self-pay | Admitting: Oncology

## 2008-09-17 ENCOUNTER — Emergency Department (HOSPITAL_BASED_OUTPATIENT_CLINIC_OR_DEPARTMENT_OTHER): Admission: EM | Admit: 2008-09-17 | Discharge: 2008-09-17 | Payer: Self-pay | Admitting: Emergency Medicine

## 2008-09-17 LAB — CBC WITH DIFFERENTIAL/PLATELET
BASO%: 1 % (ref 0.0–2.0)
Basophils Absolute: 0.1 10*3/uL (ref 0.0–0.1)
EOS%: 2.3 % (ref 0.0–7.0)
Eosinophils Absolute: 0.2 10*3/uL (ref 0.0–0.5)
HCT: 32.5 % — ABNORMAL LOW (ref 34.8–46.6)
HGB: 10.6 g/dL — ABNORMAL LOW (ref 11.6–15.9)
LYMPH%: 12.6 % — ABNORMAL LOW (ref 14.0–49.7)
MCH: 28.6 pg (ref 25.1–34.0)
MCHC: 32.6 g/dL (ref 31.5–36.0)
MCV: 87.8 fL (ref 79.5–101.0)
MONO#: 0.9 10*3/uL (ref 0.1–0.9)
MONO%: 12.5 % (ref 0.0–14.0)
NEUT#: 5.1 10*3/uL (ref 1.5–6.5)
NEUT%: 71.6 % (ref 38.4–76.8)
Platelets: 143 10*3/uL — ABNORMAL LOW (ref 145–400)
RBC: 3.7 10*6/uL (ref 3.70–5.45)
RDW: 13.7 % (ref 11.2–14.5)
WBC: 7.1 10*3/uL (ref 3.9–10.3)
lymph#: 0.9 10*3/uL (ref 0.9–3.3)

## 2008-10-12 ENCOUNTER — Ambulatory Visit: Payer: Self-pay | Admitting: Oncology

## 2008-12-09 ENCOUNTER — Encounter: Admission: RE | Admit: 2008-12-09 | Discharge: 2008-12-09 | Payer: Self-pay | Admitting: Cardiology

## 2009-01-19 ENCOUNTER — Encounter: Payer: Self-pay | Admitting: Emergency Medicine

## 2009-01-19 ENCOUNTER — Ambulatory Visit (HOSPITAL_COMMUNITY): Admission: RE | Admit: 2009-01-19 | Discharge: 2009-01-19 | Payer: Self-pay | Admitting: Cardiology

## 2009-03-02 ENCOUNTER — Ambulatory Visit: Payer: Self-pay | Admitting: Oncology

## 2009-03-07 LAB — CBC WITH DIFFERENTIAL/PLATELET
BASO%: 0.6 % (ref 0.0–2.0)
Basophils Absolute: 0 10*3/uL (ref 0.0–0.1)
EOS%: 5.7 % (ref 0.0–7.0)
Eosinophils Absolute: 0.4 10*3/uL (ref 0.0–0.5)
HCT: 31.7 % — ABNORMAL LOW (ref 34.8–46.6)
HGB: 10.8 g/dL — ABNORMAL LOW (ref 11.6–15.9)
LYMPH%: 15.1 % (ref 14.0–49.7)
MCH: 30.4 pg (ref 25.1–34.0)
MCHC: 34.3 g/dL (ref 31.5–36.0)
MCV: 88.6 fL (ref 79.5–101.0)
MONO#: 0.7 10*3/uL (ref 0.1–0.9)
MONO%: 9.4 % (ref 0.0–14.0)
NEUT#: 5.3 10*3/uL (ref 1.5–6.5)
NEUT%: 69.2 % (ref 38.4–76.8)
Platelets: 167 10*3/uL (ref 145–400)
RBC: 3.57 10*6/uL — ABNORMAL LOW (ref 3.70–5.45)
RDW: 13.8 % (ref 11.2–14.5)
WBC: 7.7 10*3/uL (ref 3.9–10.3)
lymph#: 1.2 10*3/uL (ref 0.9–3.3)

## 2009-04-06 ENCOUNTER — Encounter: Payer: Self-pay | Admitting: Emergency Medicine

## 2009-04-06 ENCOUNTER — Ambulatory Visit (HOSPITAL_COMMUNITY): Admission: RE | Admit: 2009-04-06 | Discharge: 2009-04-06 | Payer: Self-pay | Admitting: Cardiology

## 2009-05-12 ENCOUNTER — Encounter: Payer: Self-pay | Admitting: Emergency Medicine

## 2009-05-12 ENCOUNTER — Ambulatory Visit: Payer: Self-pay | Admitting: Emergency Medicine

## 2009-05-12 DIAGNOSIS — R0602 Shortness of breath: Secondary | ICD-10-CM | POA: Insufficient documentation

## 2009-05-12 DIAGNOSIS — Z8679 Personal history of other diseases of the circulatory system: Secondary | ICD-10-CM

## 2009-05-12 DIAGNOSIS — E785 Hyperlipidemia, unspecified: Secondary | ICD-10-CM | POA: Insufficient documentation

## 2009-05-12 DIAGNOSIS — J45909 Unspecified asthma, uncomplicated: Secondary | ICD-10-CM

## 2009-05-12 DIAGNOSIS — I10 Essential (primary) hypertension: Secondary | ICD-10-CM | POA: Insufficient documentation

## 2009-05-12 HISTORY — DX: Personal history of other diseases of the circulatory system: Z86.79

## 2009-05-12 HISTORY — DX: Unspecified asthma, uncomplicated: J45.909

## 2009-06-14 ENCOUNTER — Encounter: Payer: Self-pay | Admitting: Emergency Medicine

## 2009-06-16 ENCOUNTER — Encounter: Admission: RE | Admit: 2009-06-16 | Discharge: 2009-06-16 | Payer: Self-pay | Admitting: Cardiology

## 2009-08-25 ENCOUNTER — Ambulatory Visit: Payer: Self-pay | Admitting: Oncology

## 2009-09-26 IMAGING — CR DG CHEST 2V
2 series · 2 of 2 positions shown · non-contrast
Comparison: None

CLINICAL DATA: Chest pain, atrial fibrillation.

CHEST - 2 VIEW

[w chest pa]
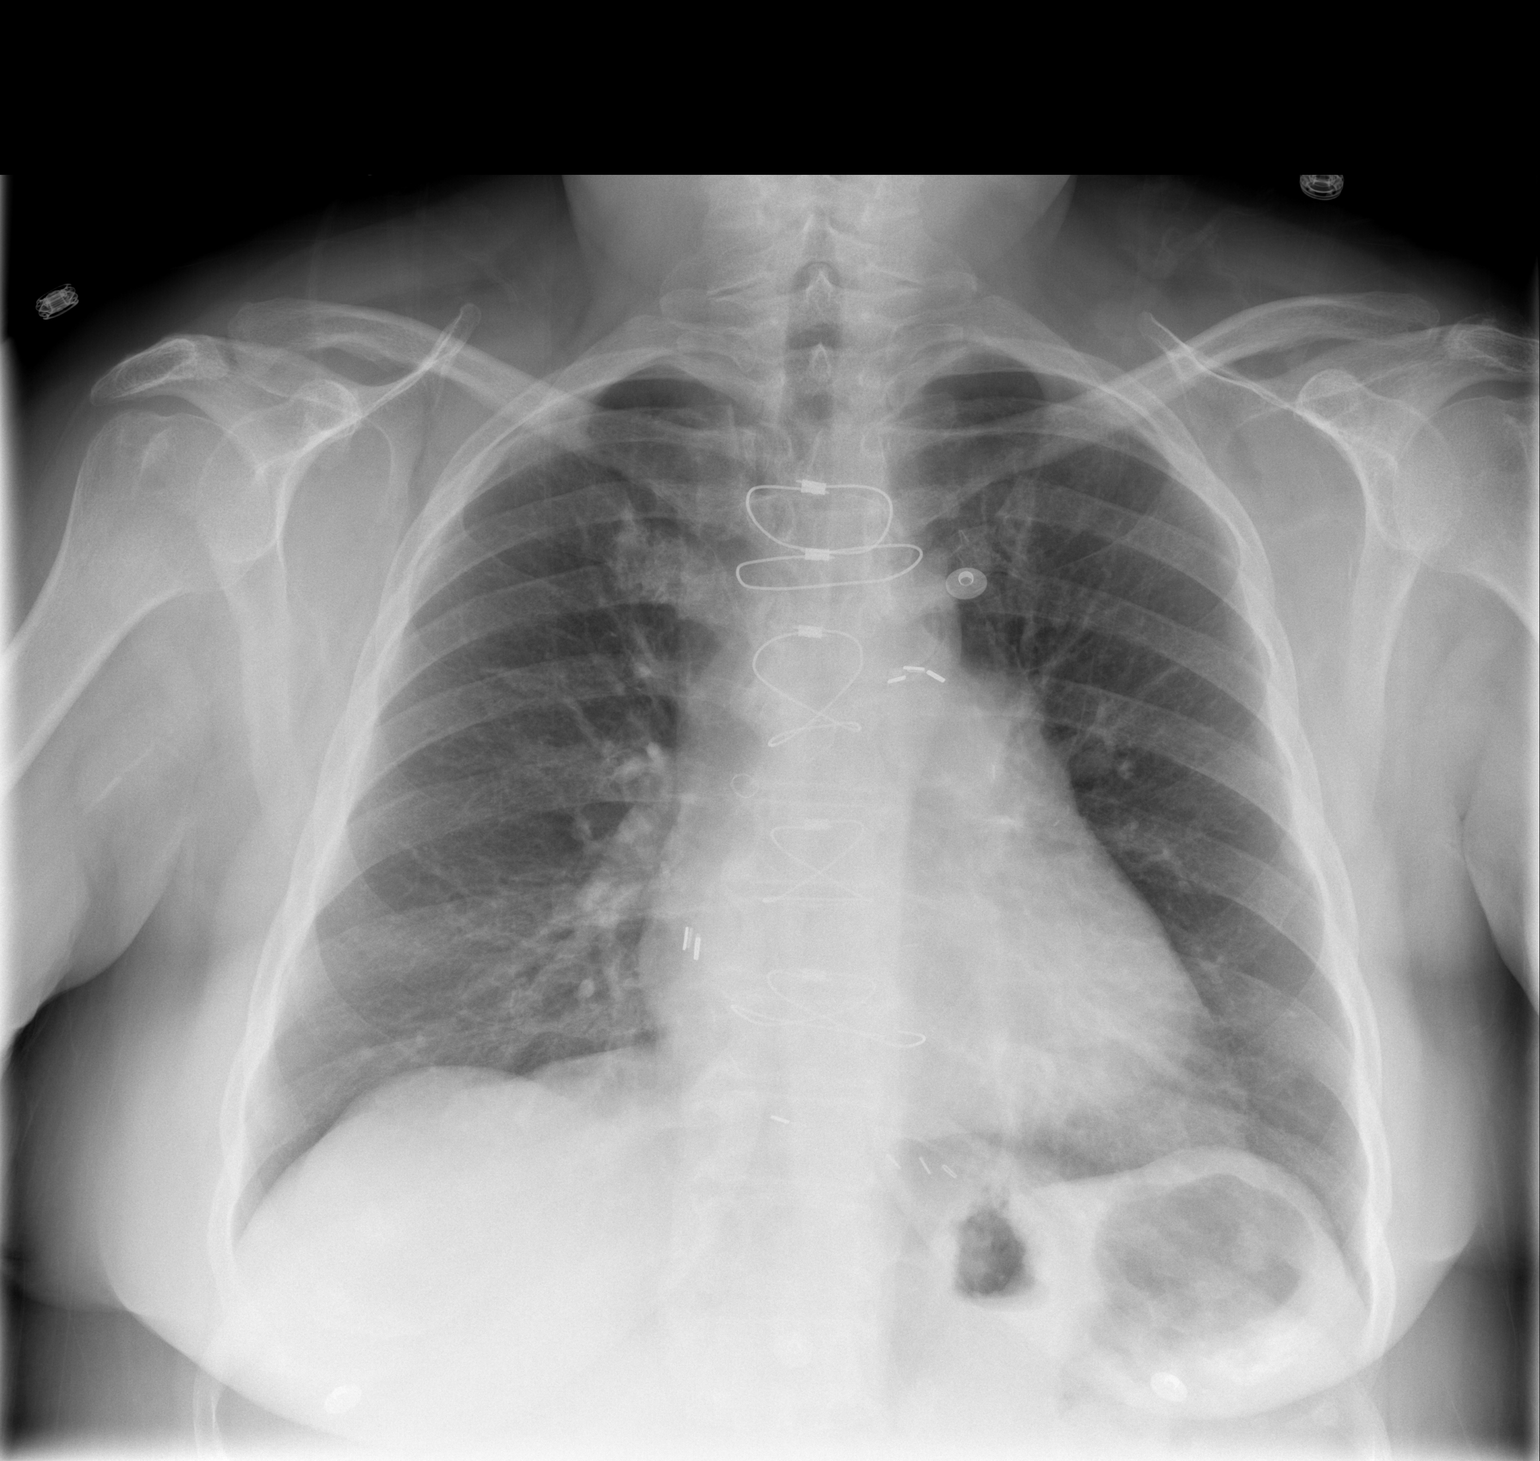

[w chest lat]
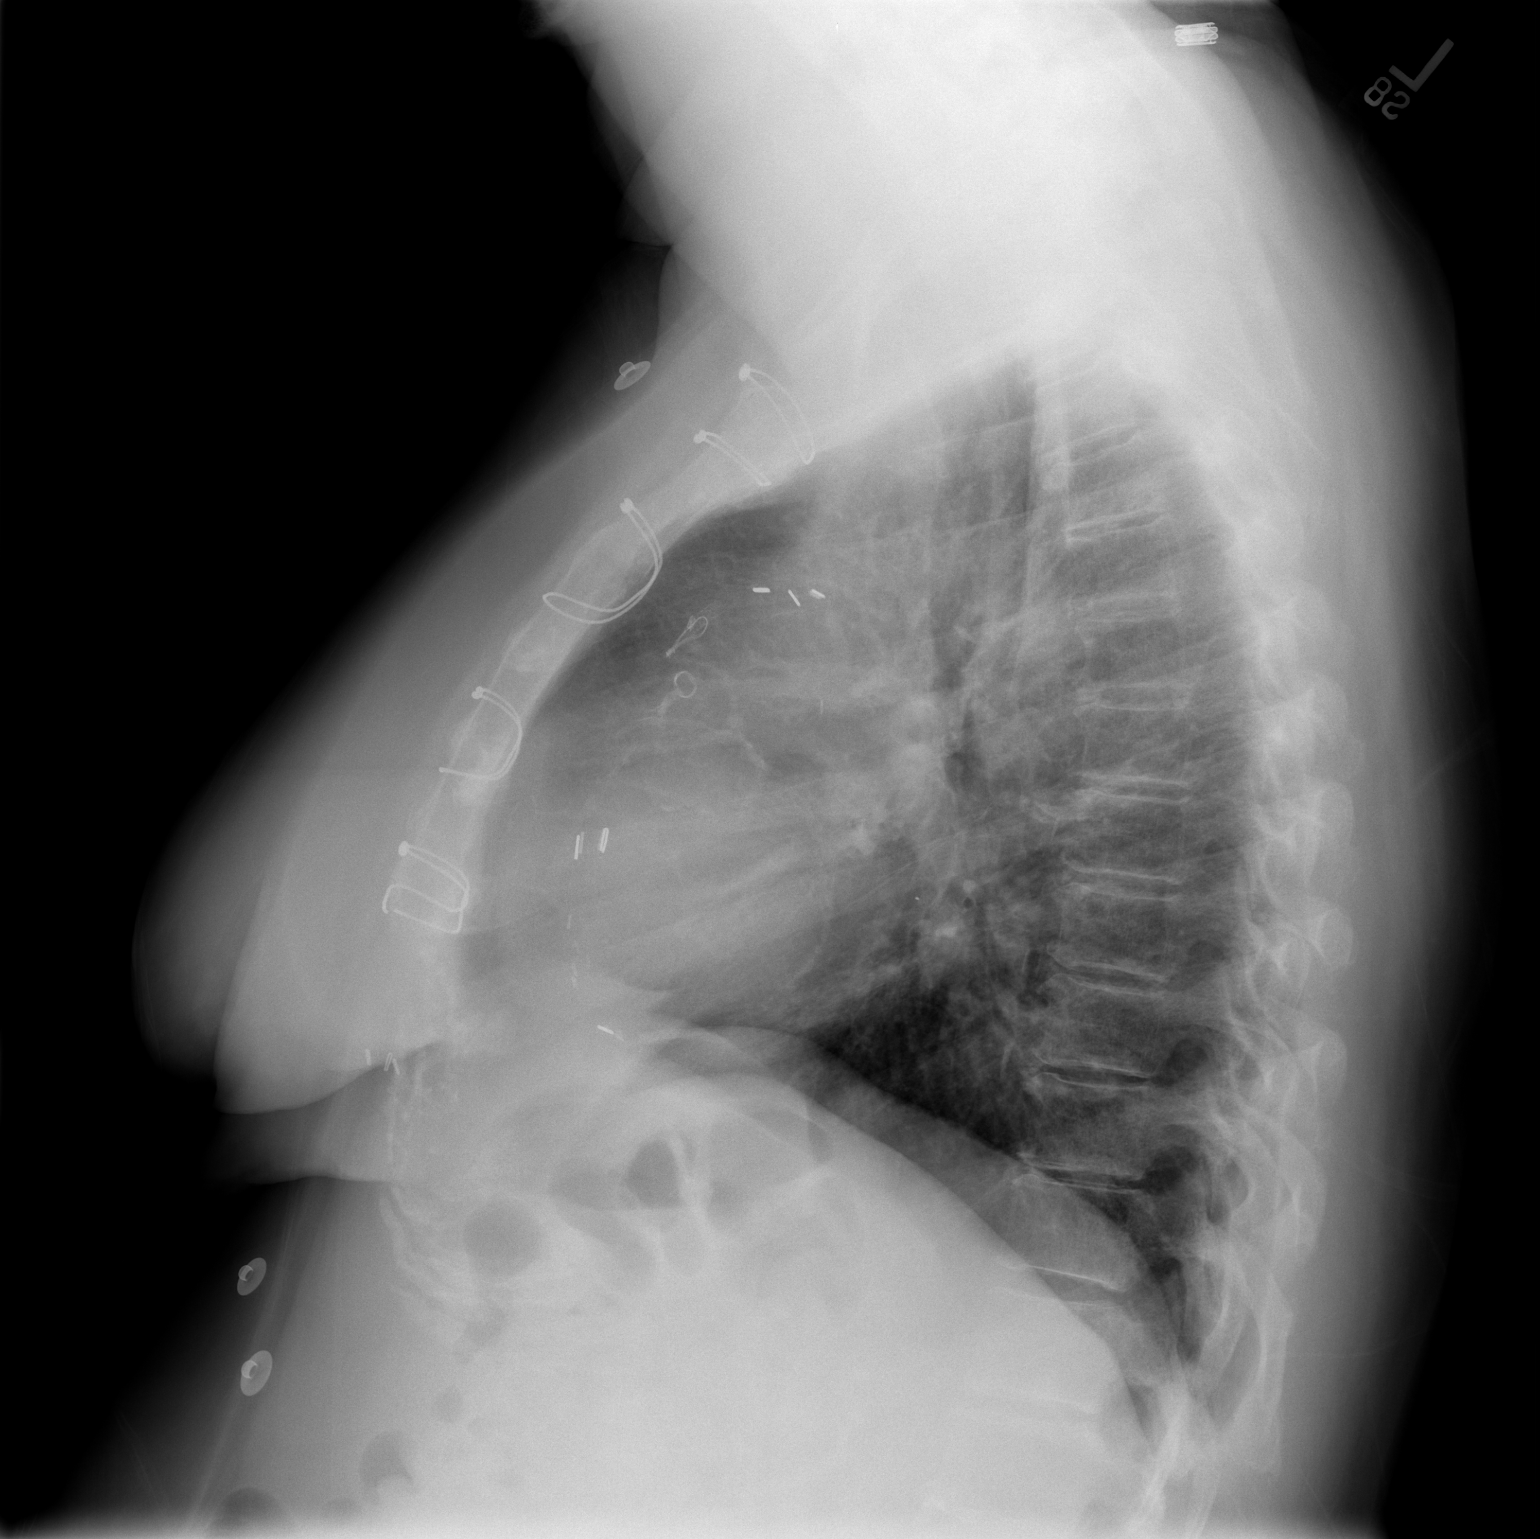

[2 of 2 positions shown; findings below may reference images not displayed]

FINDINGS: Trachea is midline.  Heart size within normal limits.
Lungs are clear.  No pleural fluid.
IMPRESSION: No acute findings.

## 2009-10-14 ENCOUNTER — Ambulatory Visit: Payer: Self-pay | Admitting: Emergency Medicine

## 2009-10-14 DIAGNOSIS — R059 Cough, unspecified: Secondary | ICD-10-CM | POA: Insufficient documentation

## 2009-10-14 DIAGNOSIS — J309 Allergic rhinitis, unspecified: Secondary | ICD-10-CM

## 2009-10-14 DIAGNOSIS — R05 Cough: Secondary | ICD-10-CM | POA: Insufficient documentation

## 2009-10-14 HISTORY — DX: Allergic rhinitis, unspecified: J30.9

## 2009-10-18 ENCOUNTER — Telehealth (INDEPENDENT_AMBULATORY_CARE_PROVIDER_SITE_OTHER): Payer: Self-pay | Admitting: *Deleted

## 2009-12-18 IMAGING — CR DG CHEST 2V
2 series · 2 of 2 positions shown · non-contrast
Comparison: 01/09/2008 study.

CLINICAL DATA: History given of coronary artery disease,
hypertension, asthma, chronic bronchitis, COPD with shortness of
breath and coughing.  Preoperative cardiopulmonary evaluation.

CHEST - 2 VIEW

[view not recorded (1 of 2)]
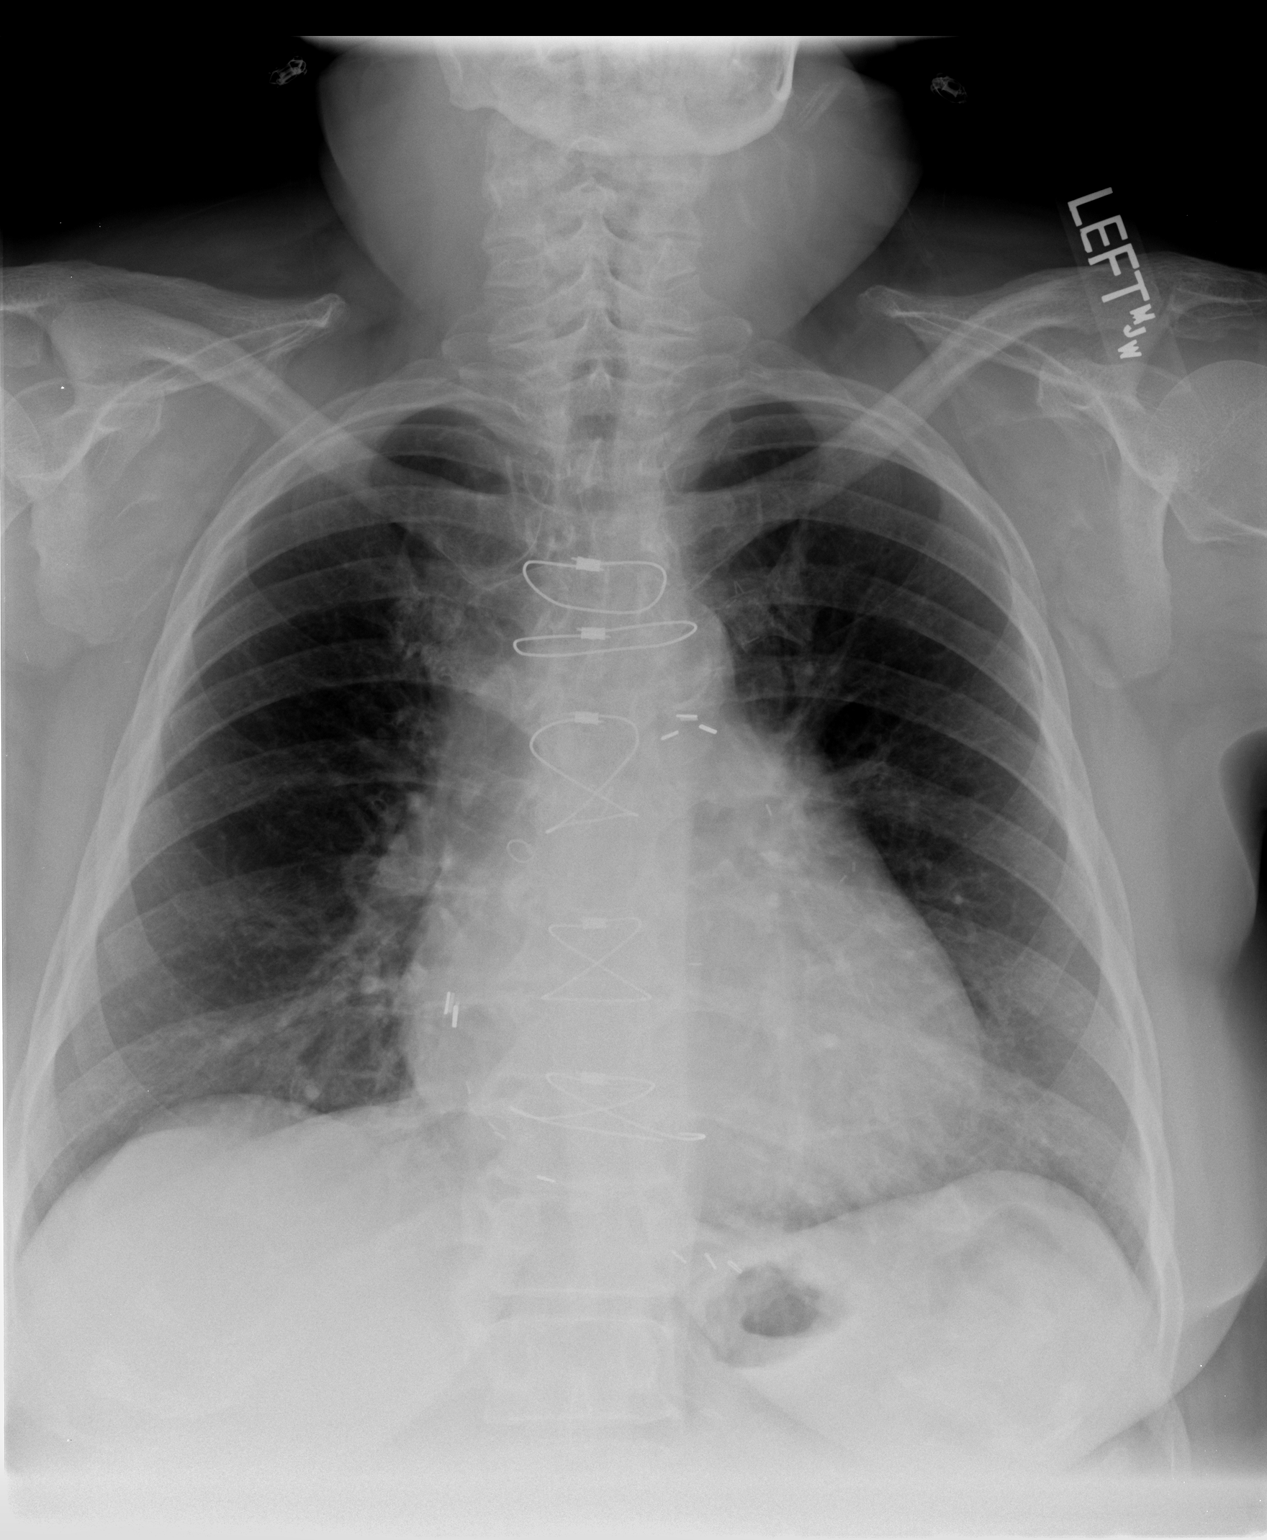

[view not recorded (2 of 2)]
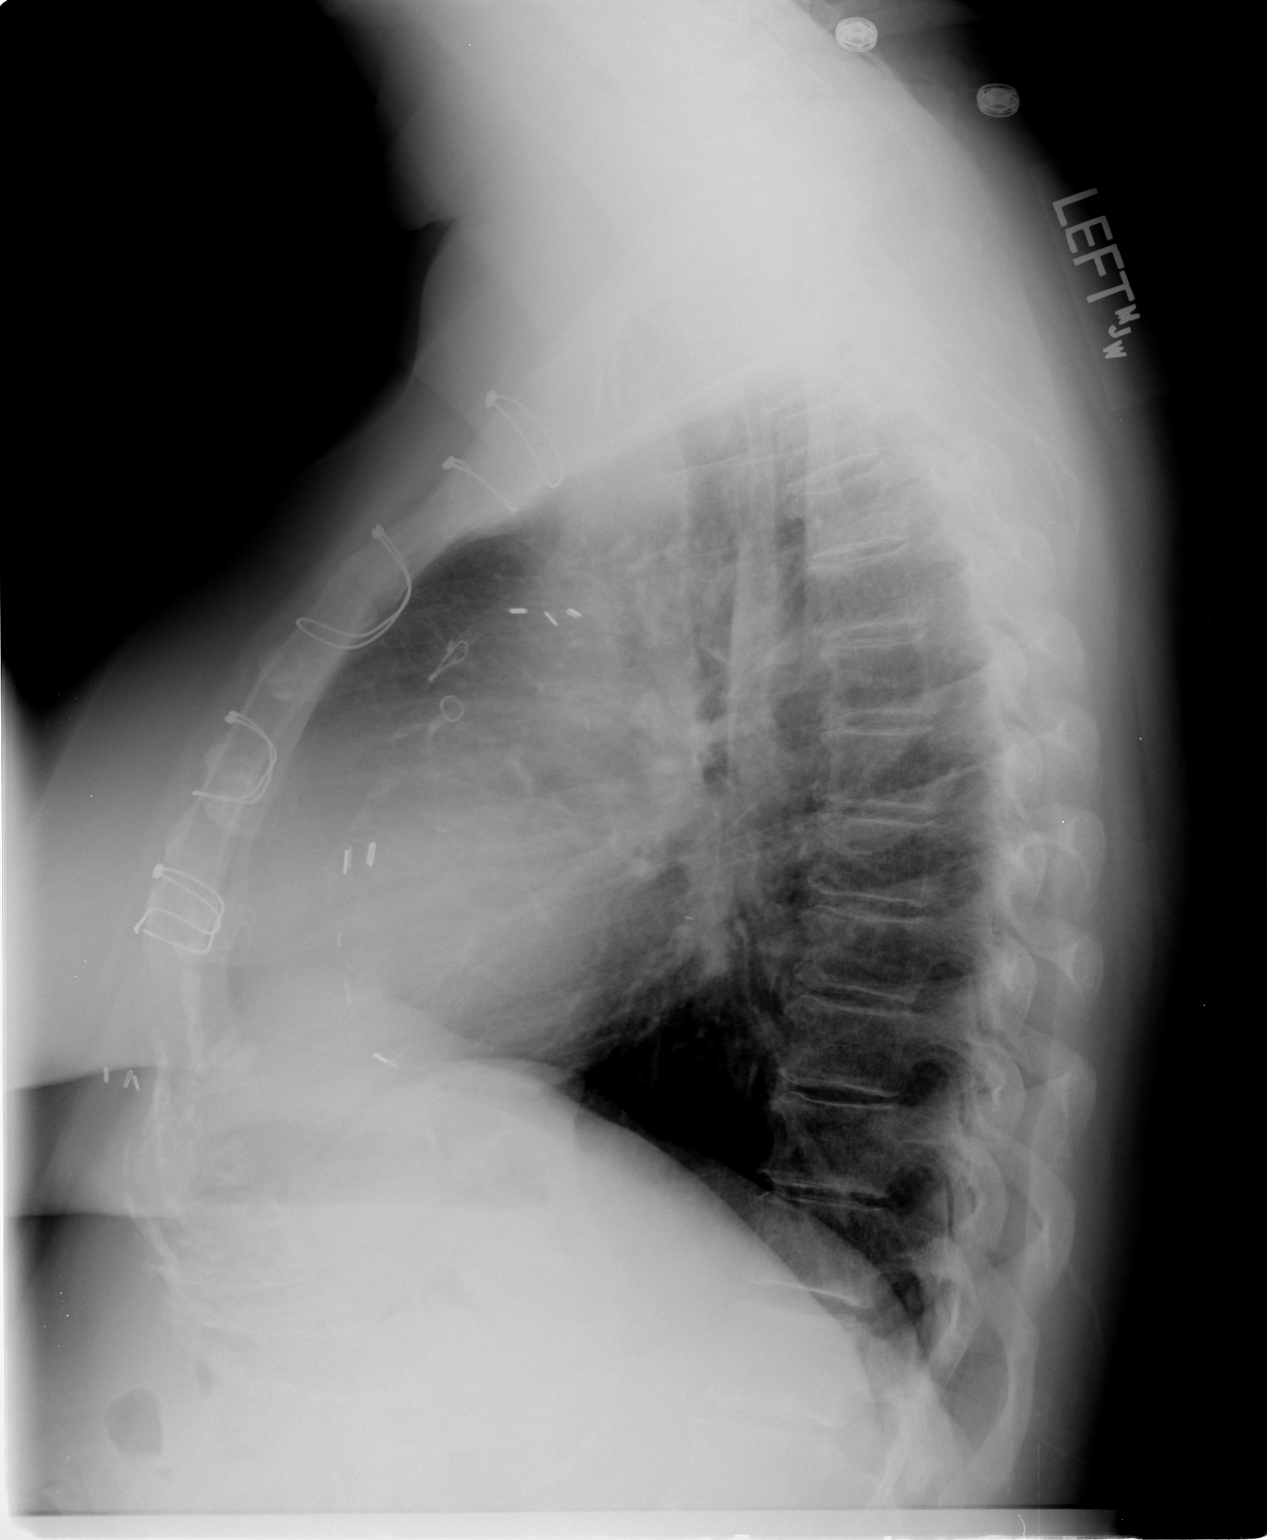

[2 of 2 positions shown; findings below may reference images not displayed]

FINDINGS: Previous median sternotomy and coronary artery bypass
grafting have been performed.  Cardiac silhouette is borderline in
size.  Lungs are free of infiltrates.  No pleural effusions are
seen.  Nonaneurysmal aortic calcification is evident.  There is
moderate degenerative spondylosis.
IMPRESSION: Borderline enlargement of the cardiac silhouette.  No acute process
seen.

## 2009-12-22 IMAGING — CR DG CHEST 1V PORT
1 series · 1 of 1 positions shown · non-contrast
Comparison: 04/01/2008

CLINICAL DATA: CABG

PORTABLE CHEST - 1 VIEW

[view not recorded]
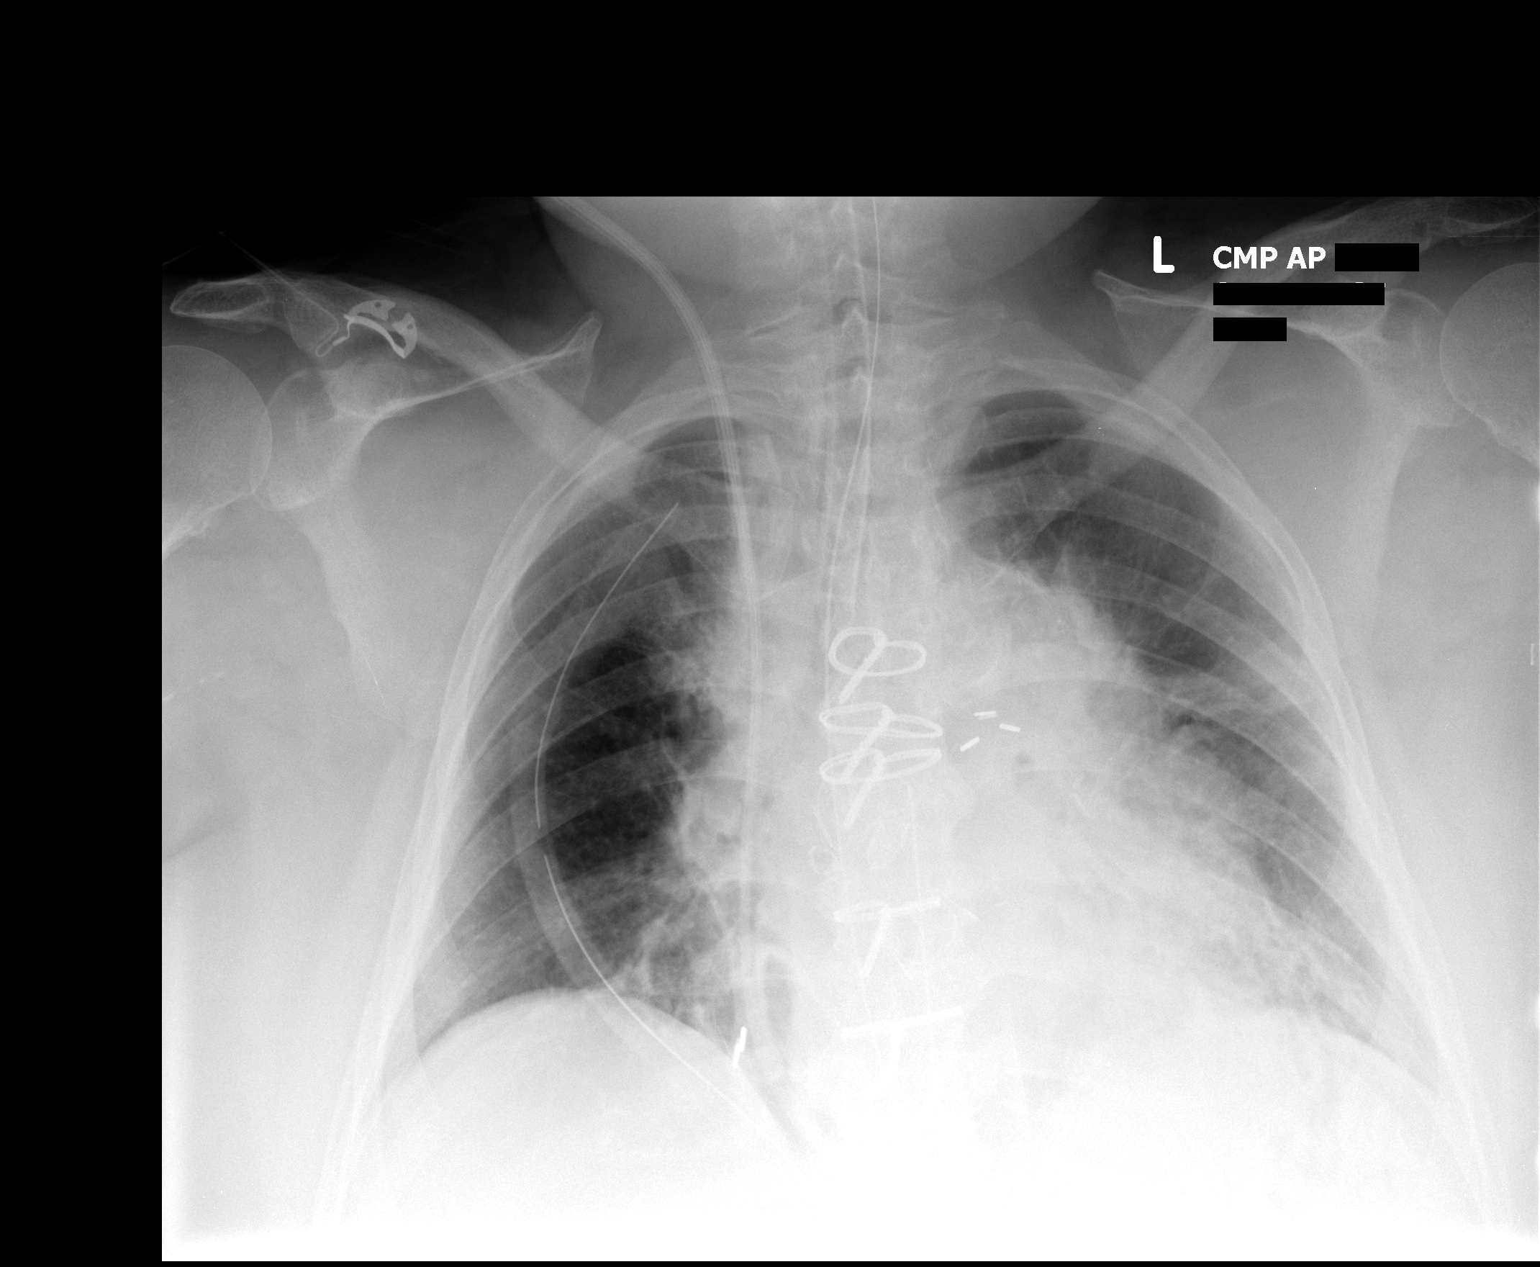

[1 of 1 positions shown; findings below may reference images not displayed]

FINDINGS: The tip of the endotracheal tube is 5 mm above the
carina.  A right internal jugular introducer and Swan-Ganz catheter
are present.  It is coiled in the right atrium and the tip is
obscured.  Chest and mediastinal tubes are present without
pneumothorax.  Patchy atelectasis worse airspace disease in the
left mid and lower lung zone as well as the right base is present.
The mediastinum is also obscured.
IMPRESSION: Michael Kwame is coiled in the right atrium and the tip is obscured

Patchy airspace opacities in the left lung.

No pneumothorax.

Endotracheal tube tip just above the carina.

## 2009-12-23 IMAGING — CR DG CHEST 1V PORT
1 series · 1 of 1 positions shown · non-contrast
Comparison: 04/05/2008

CLINICAL DATA: Post 80 are and CABG

PORTABLE CHEST - 1 VIEW

[view not recorded]
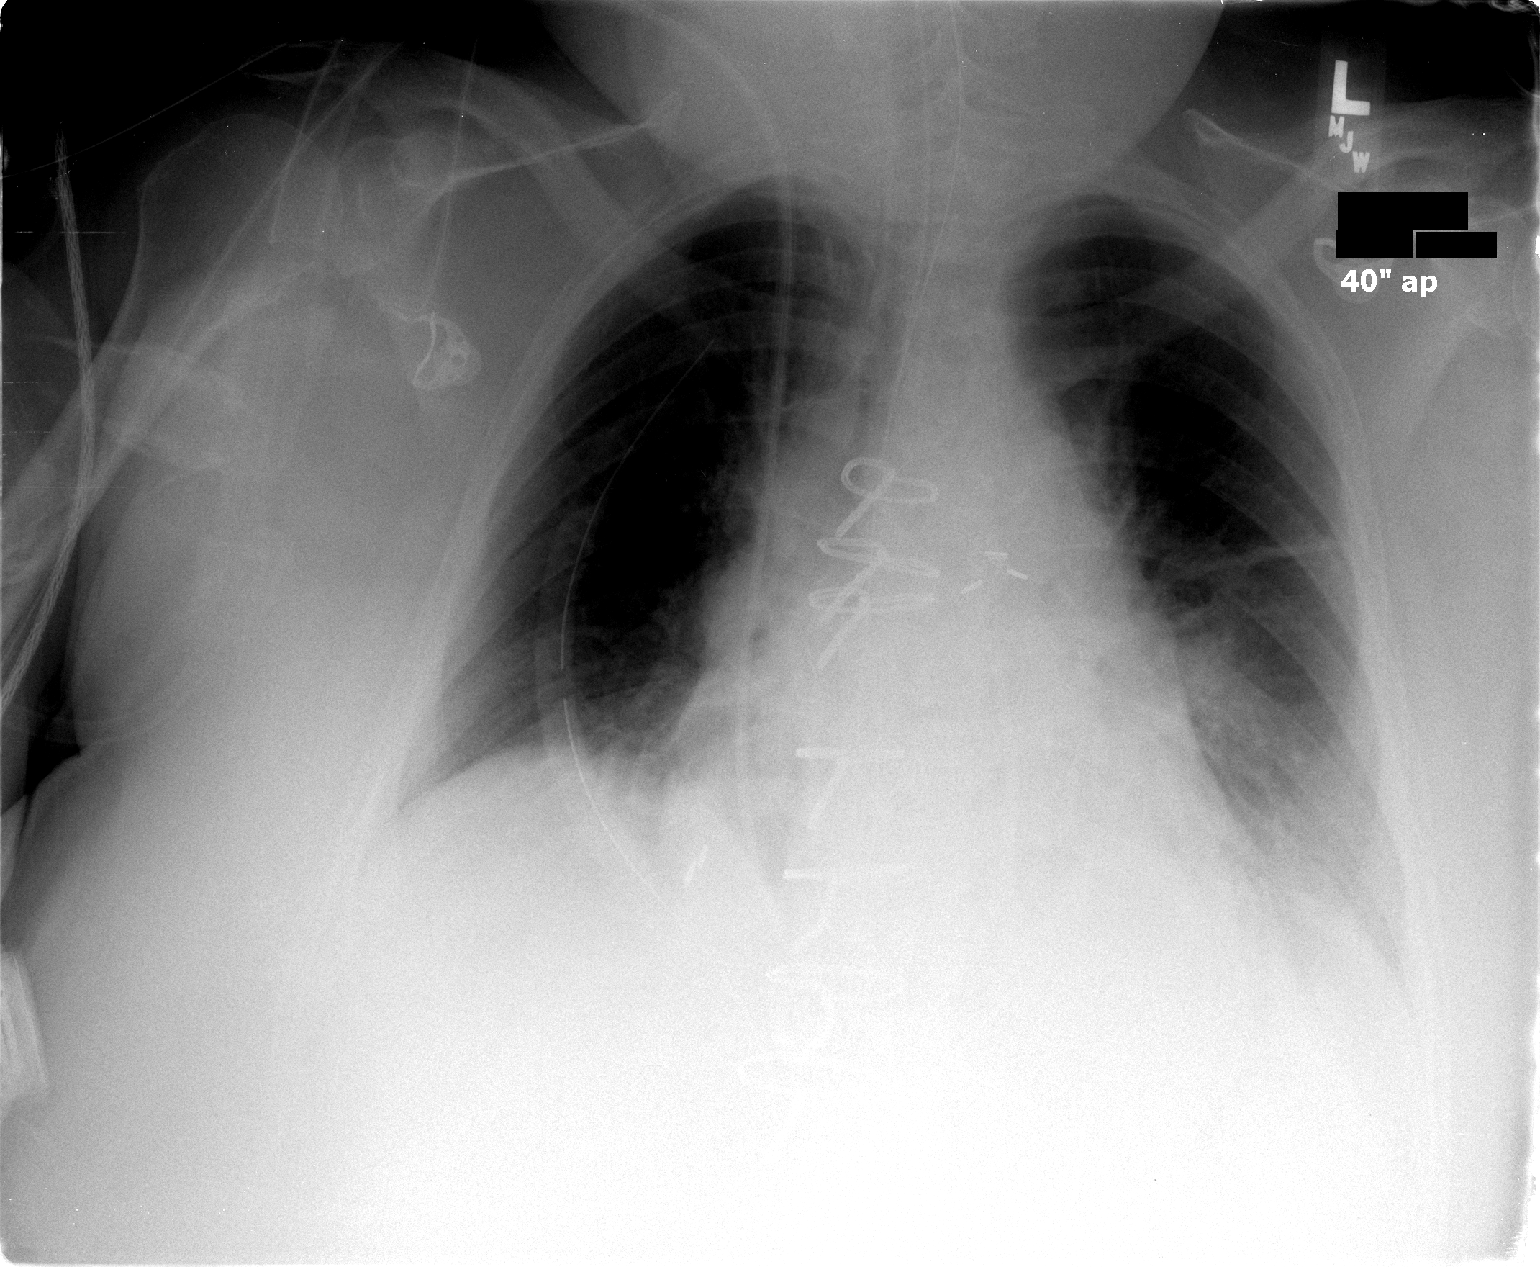

[1 of 1 positions shown; findings below may reference images not displayed]

FINDINGS: Right chest tube remains in place.  No pneumothorax.
Bilateral lung aeration slightly improved with still some residual
bibasilar density. Overall, there has been little change.

ET tube and Burrola remain in place.  No new findings.
IMPRESSION: Slight interval improvement in bilateral lung aeration - little
overall change.  No new findings.

## 2009-12-24 IMAGING — CR DG CHEST 1V PORT
1 series · 1 of 1 positions shown · non-contrast
Comparison: Portable chest 04/06/2008.

CLINICAL DATA: Aortic stenosis and coronary artery disease.  Status
post CABG and aortic valve replacement.

PORTABLE CHEST - 1 VIEW

[view not recorded]
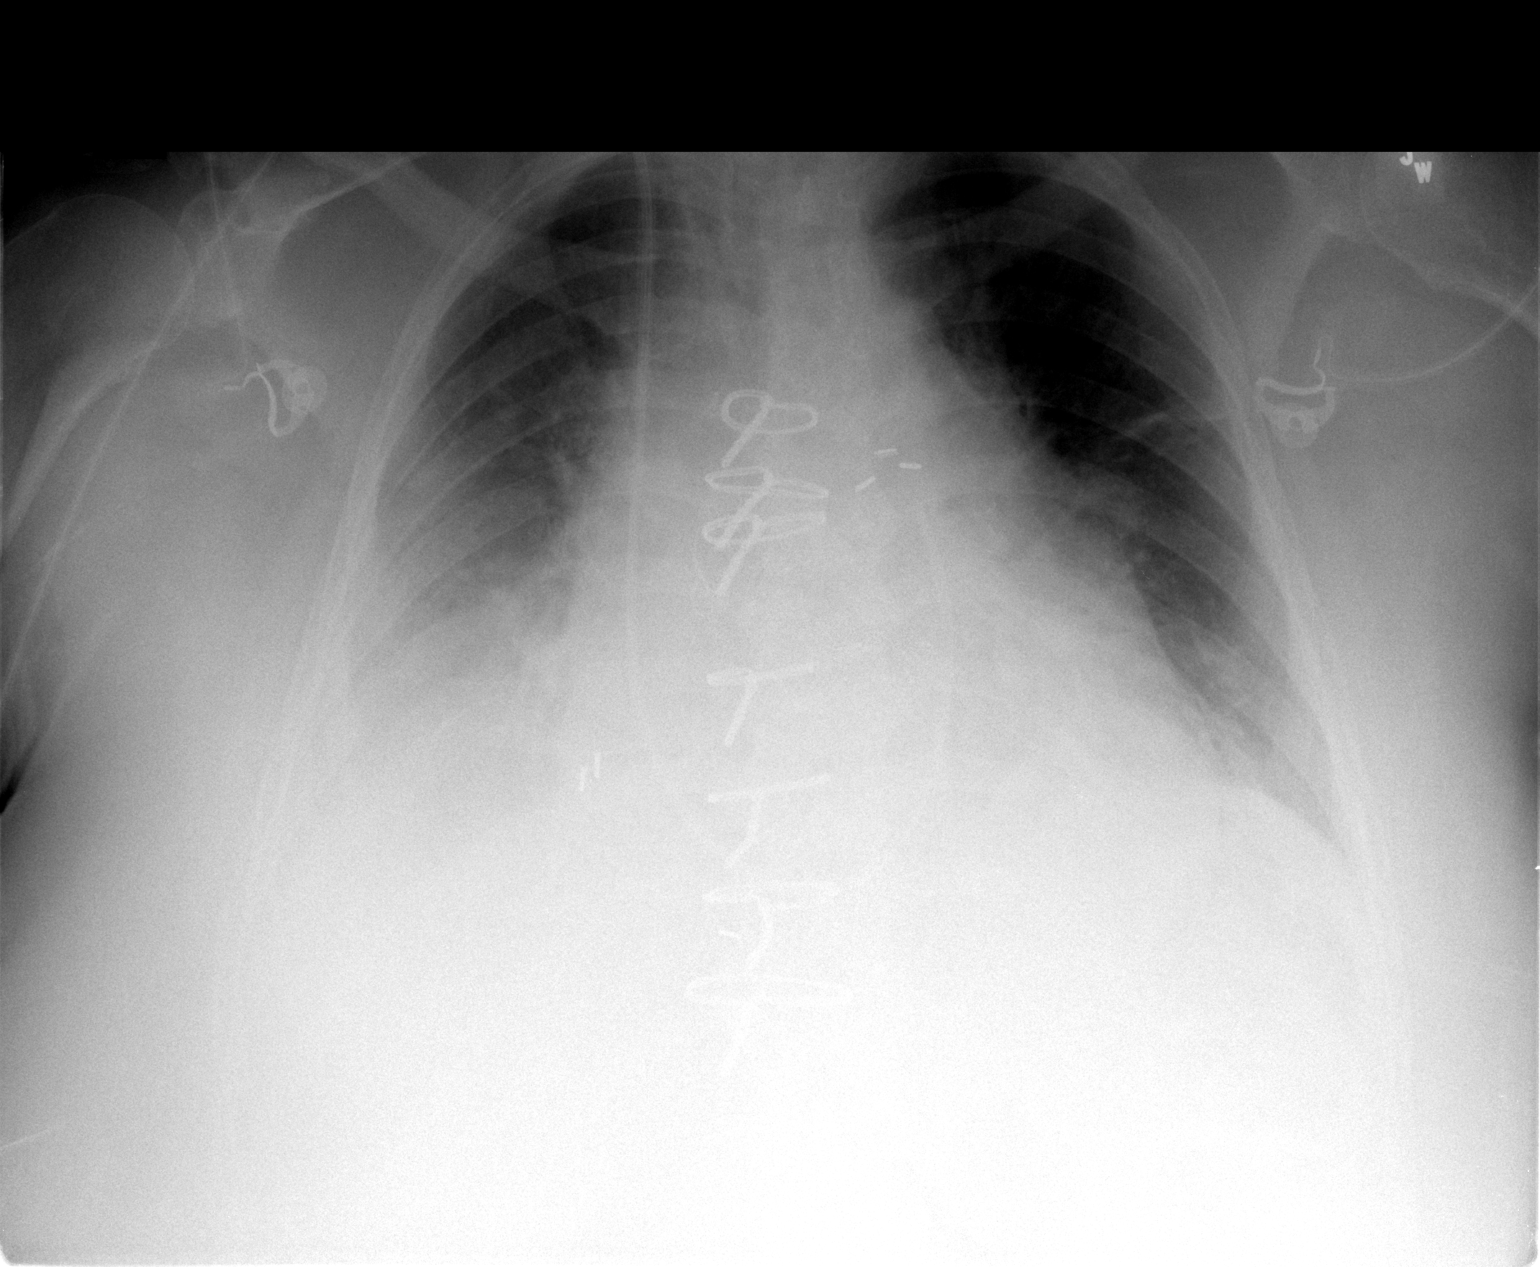

[1 of 1 positions shown; findings below may reference images not displayed]

FINDINGS: Endotracheal tube, NG tube and right chest tube evolve
been removed. Aeration in the right lung base has worsened
consistent with increased atelectasis.  Left basilar atelectasis is
unchanged.  There is likely a right pleural effusion.
IMPRESSION: 1.  Support apparatus as described.
2.  Right effusion and basilar atelectasis are new.

## 2009-12-26 IMAGING — CR DG CHEST 1V PORT
1 series · 1 of 1 positions shown · non-contrast
Comparison: 04/07/2008

CLINICAL DATA: Aortic stenosis.  Coronary artery disease.

PORTABLE CHEST - 1 VIEW

[view not recorded]
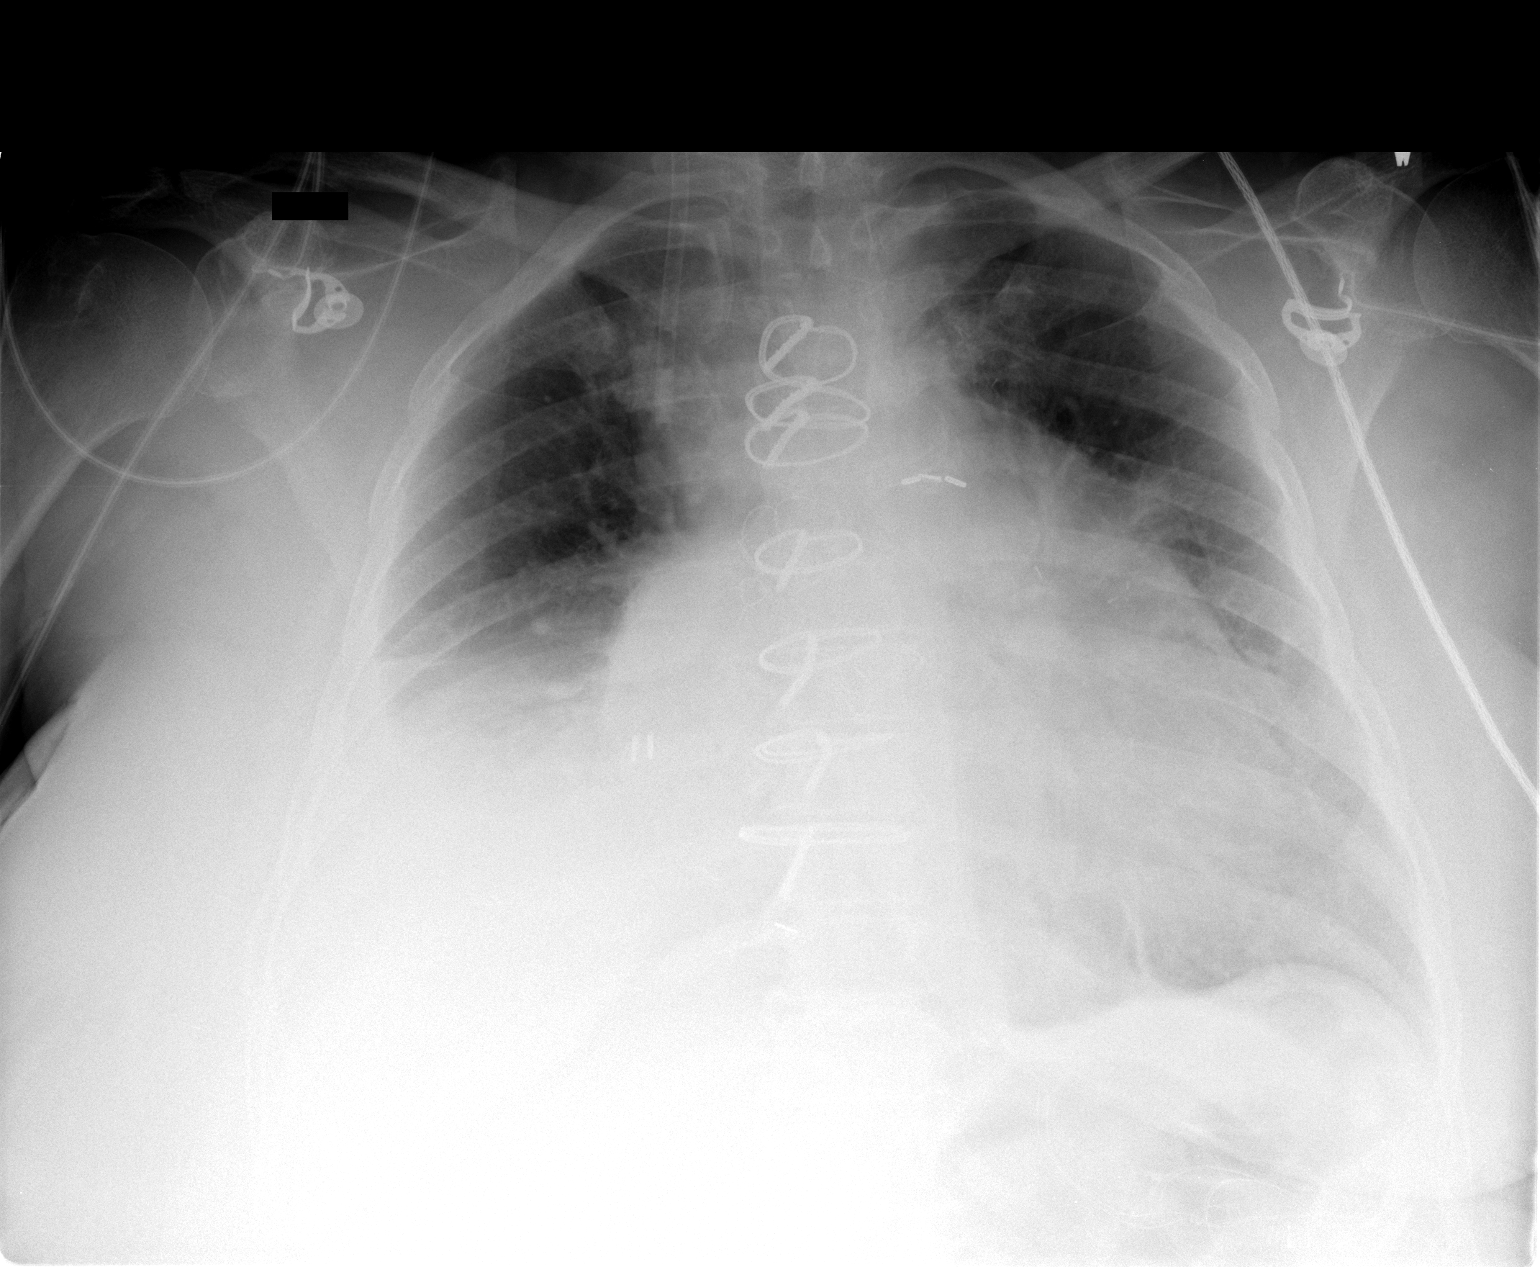

[1 of 1 positions shown; findings below may reference images not displayed]

FINDINGS: Removal of right IJ Swan-Ganz catheter with Cordis sheath
remaining in place. Prior median sternotomy. Midline trachea.
Moderate to marked enlargement of cardiopericardial silhouette.
Small right-sided pleural effusion is similar. No pneumothorax.
Slight improvement aeration with decreased interstitial edema.
Right hemidiaphragm elevation with persistent slightly decreased
right base air space disease.  Patchy left base atelectasis is
improved.
IMPRESSION: 1.  Cardiomegaly with improved interstitial edema.  Pericardial
effusion cannot be excluded.
2.  Improved right base aeration with persistent effusion and
atelectasis.

## 2009-12-26 IMAGING — CR DG CHEST 1V PORT
1 series · 1 of 1 positions shown · non-contrast
Comparison: 04/09/2008

CLINICAL DATA: Evaluate PICC line placement

PORTABLE CHEST - 1 VIEW

[AP]
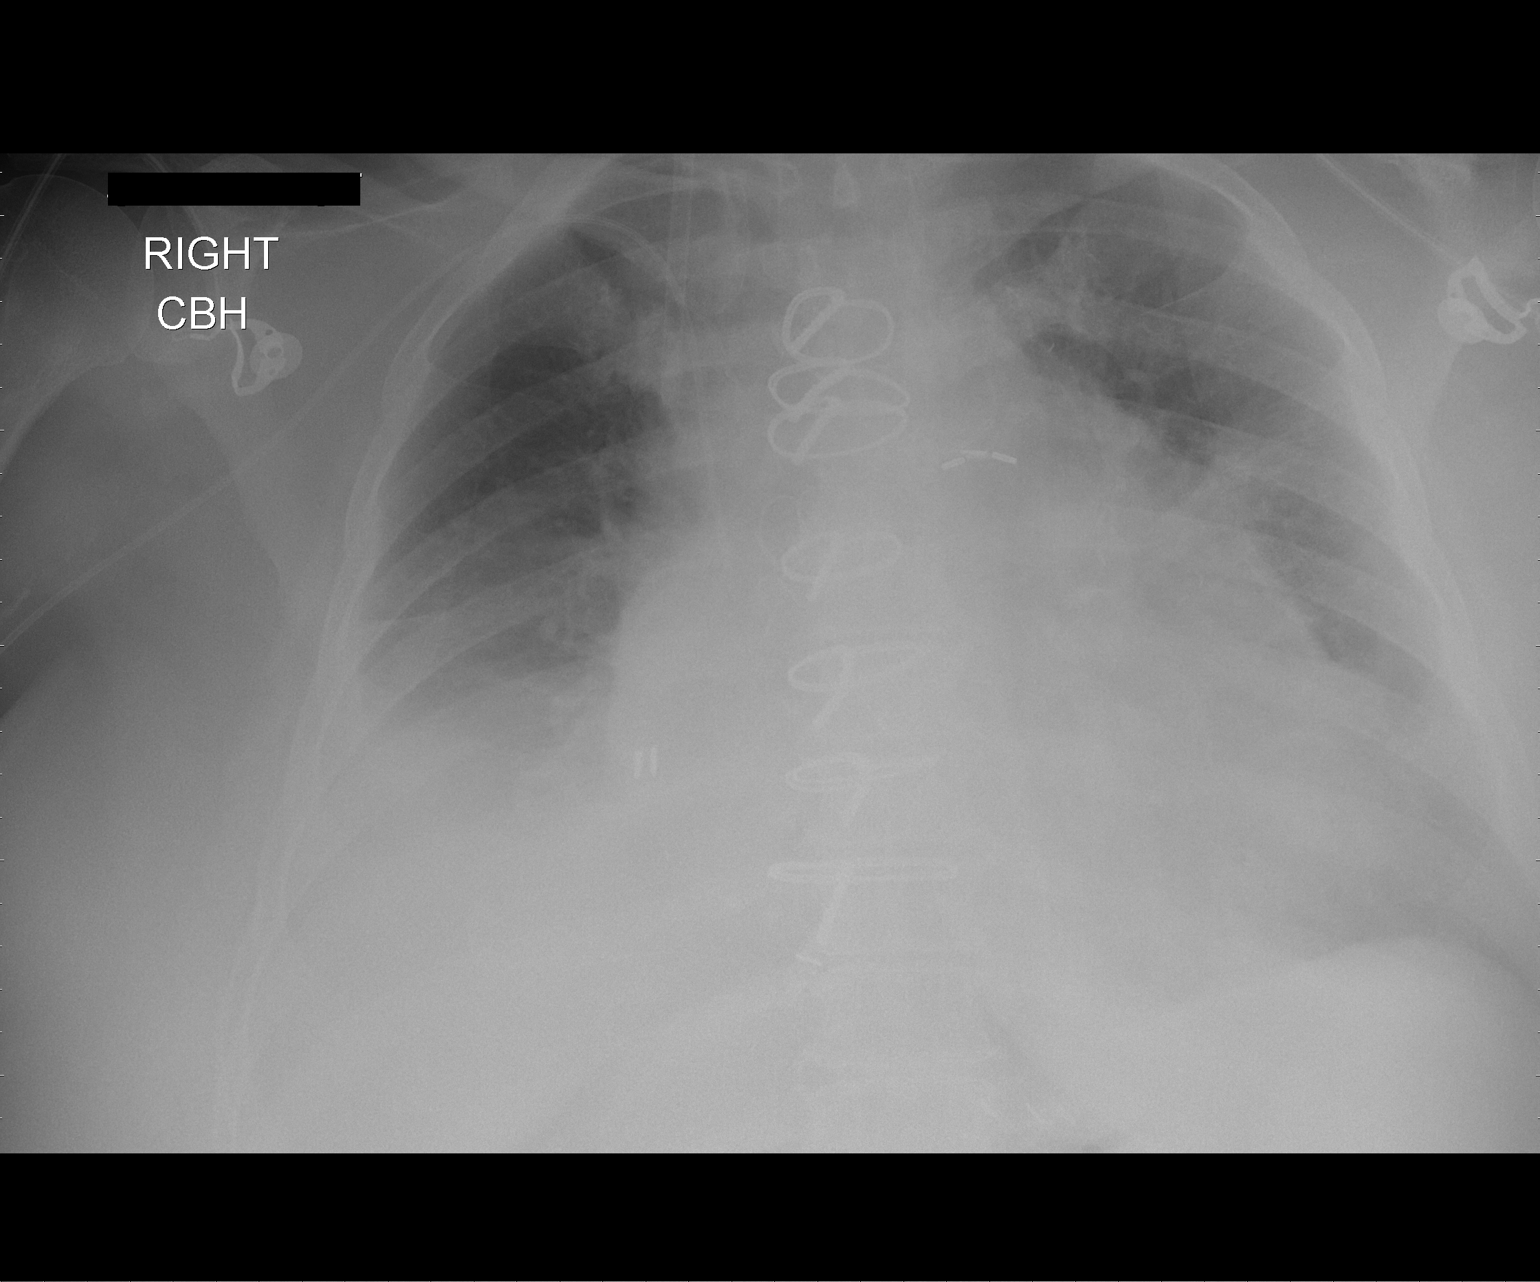

[1 of 1 positions shown; findings below may reference images not displayed]

FINDINGS: The right arm PICC line tip is in the cavoatrial
junction.

There is a right IJ catheter with tip in the SVC.

Heart size is enlarged.

There are pleural effusions and interstitial edema.

Patchy areas of atelectasis are noted bilaterally
IMPRESSION: 1.  Right arm PICC line tip in SVC.
2.  No change in congestive heart failure pattern

## 2009-12-27 IMAGING — CR DG CHEST 1V PORT
1 series · 1 of 1 positions shown · non-contrast
Comparison: Multiple priors, most recent 04/09/2008.

CLINICAL DATA: Severe AS, CAD.

PORTABLE CHEST - 1 VIEW

[view not recorded]
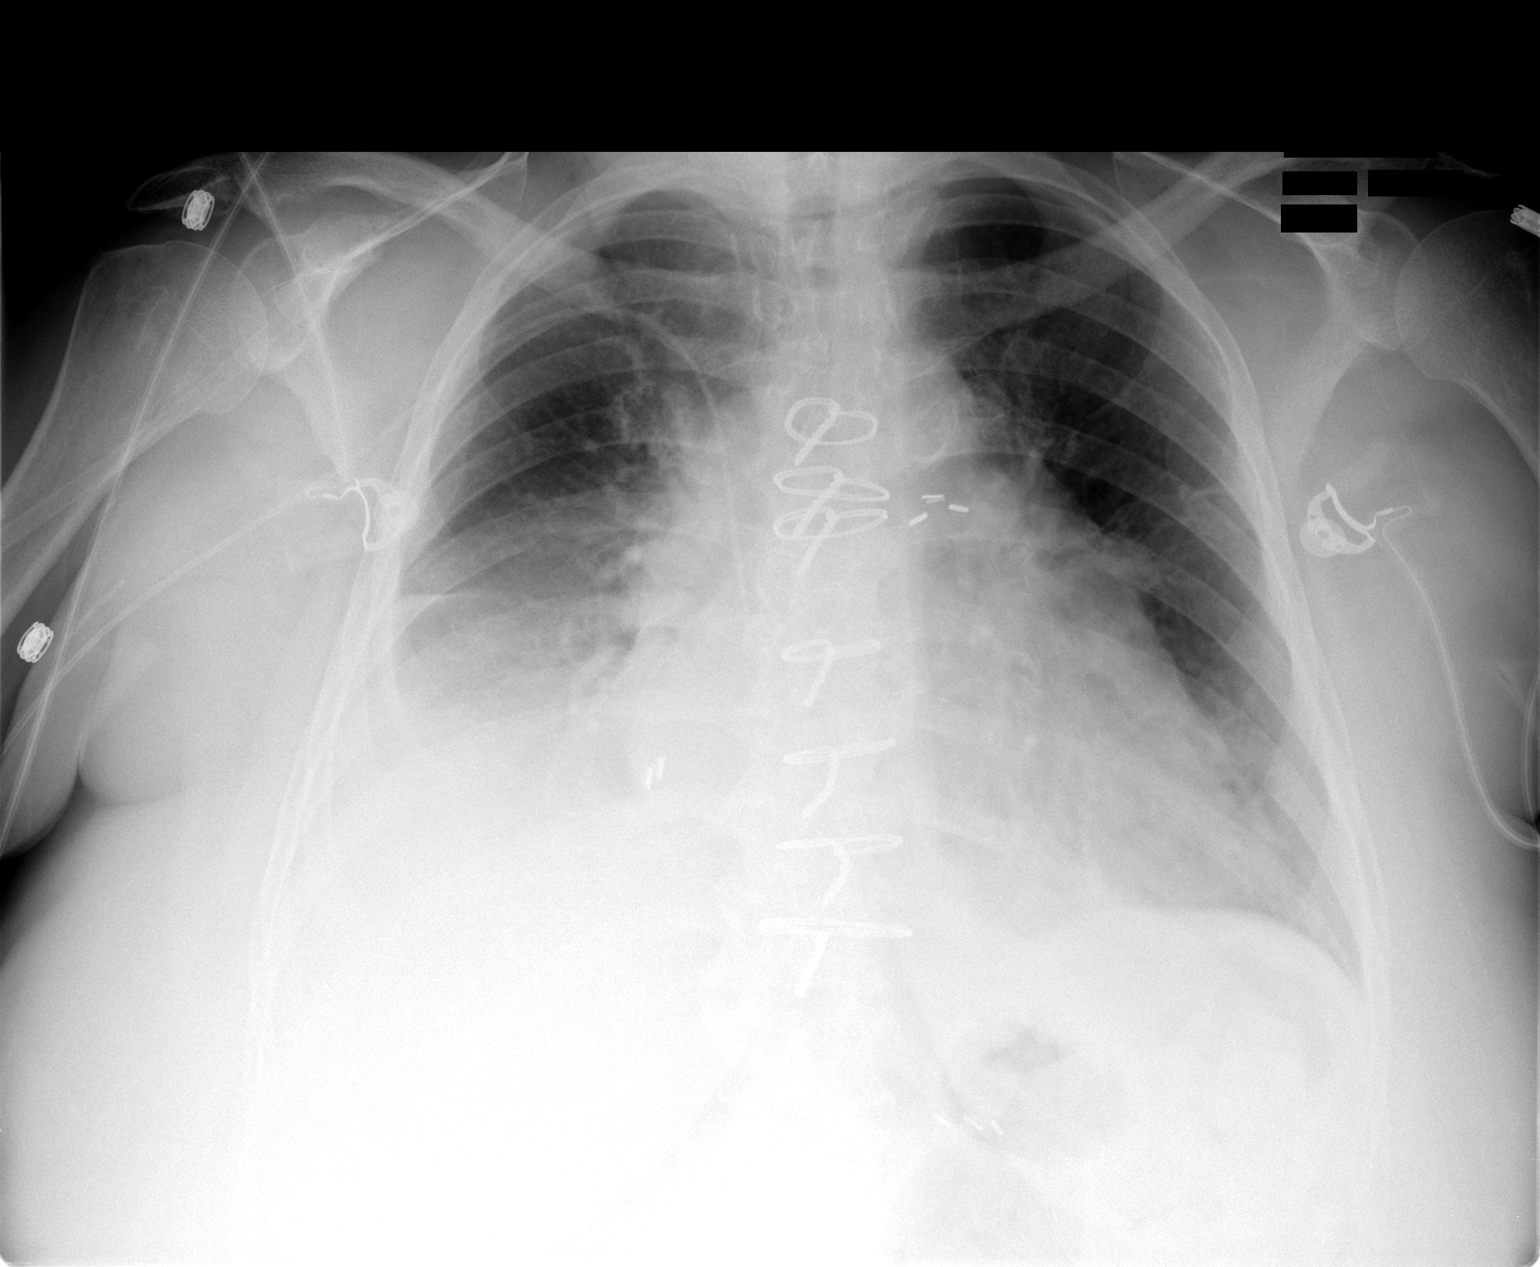

[1 of 1 positions shown; findings below may reference images not displayed]

FINDINGS: There are changes of median sternotomy for CABG.
Cardiomegaly is stable.  Right upper extremity PICC terminates near
the superior cavoatrial junction, in satisfactory position.

A right pleural effusion and right basilar opacity, likely related
to atelectasis, is unchanged.  There is linear atelectasis at the
left lung base.  The upper lung fields are clear and the pulmonary
vascularity is distinct.  Aeration is improved compared to
yesterday's radiograph, and no definite pulmonary edema is
identified on today's examination.
IMPRESSION: 1. Small right pleural effusion and right basilar atelectasis are
without significant interval change.
2.  No definite evidence of pulmonary edema.
3.  Mild left basilar atelectasis.

## 2009-12-29 IMAGING — CR DG CHEST 2V
2 series · 2 of 2 positions shown · non-contrast
Comparison: 04/10/2008

CLINICAL DATA: The severe aortic stenosis.  Coronary artery
disease.

CHEST - 2 VIEW

[w chest pa]
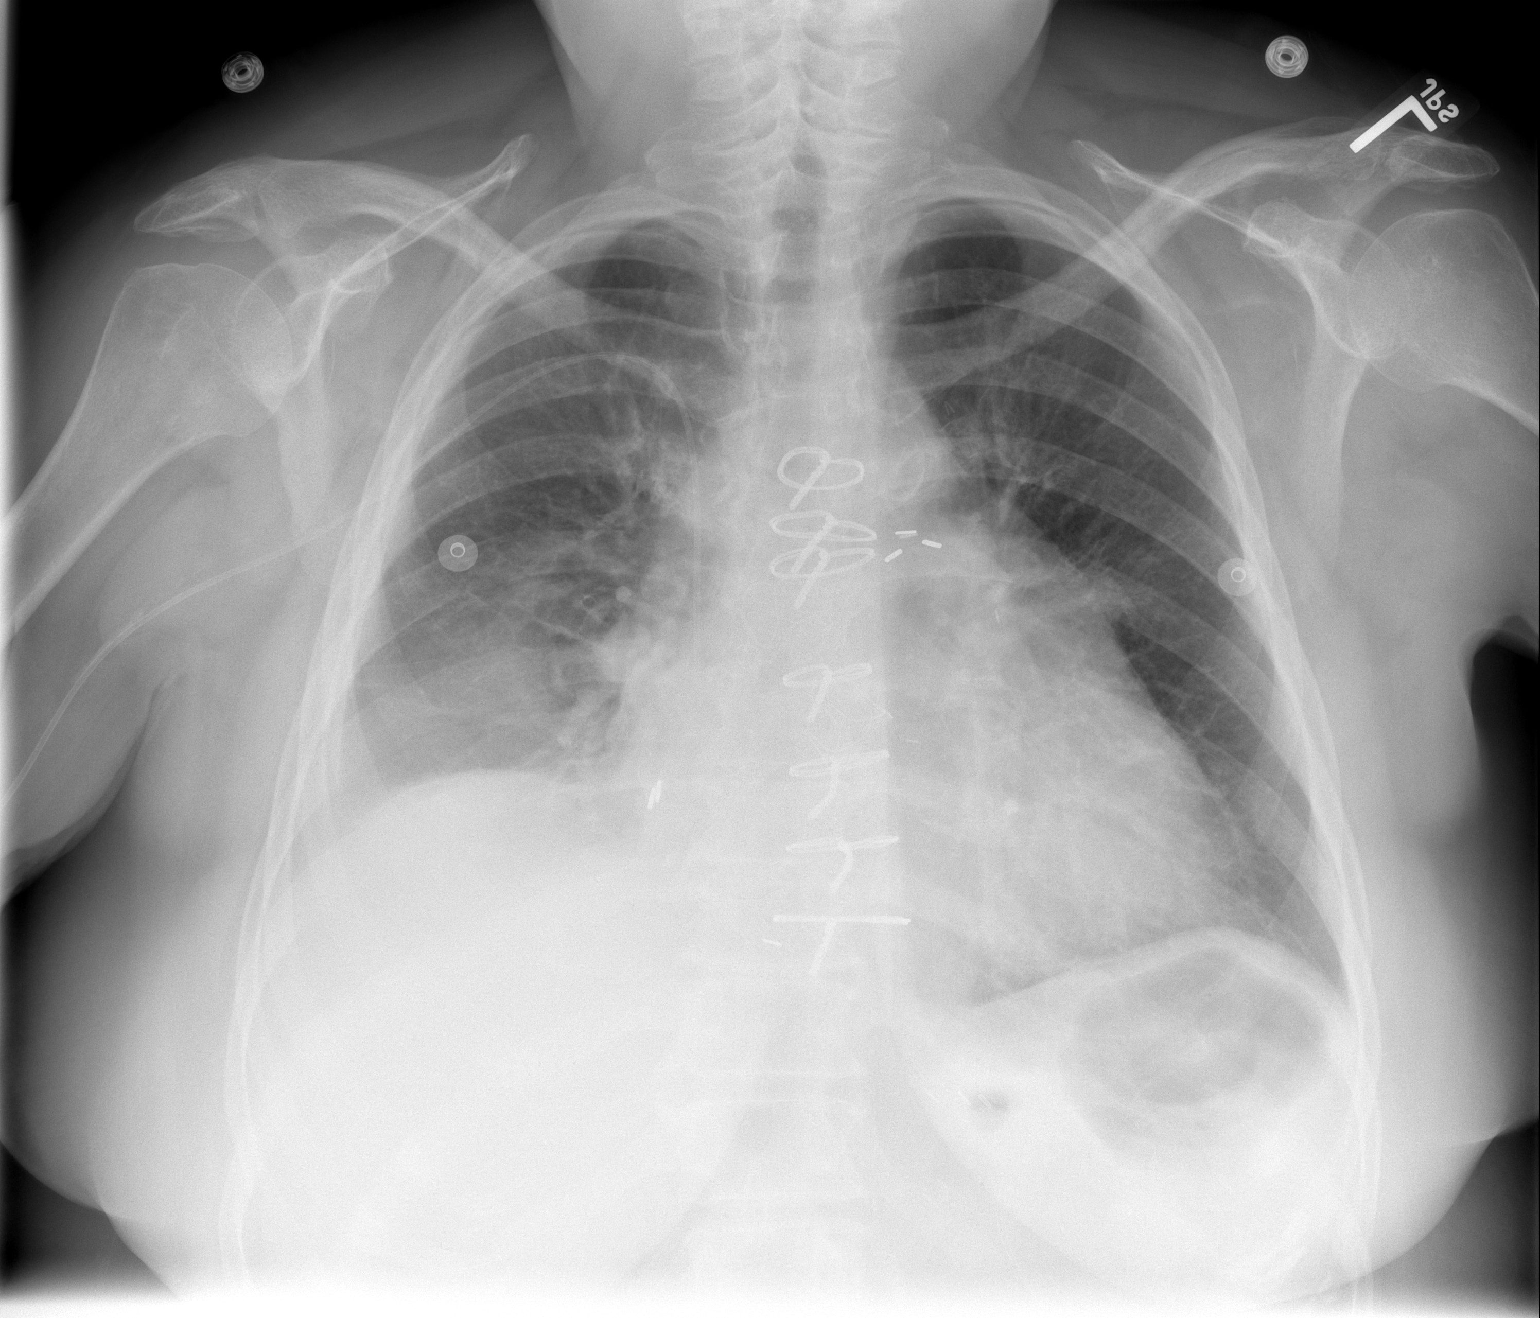

[w chest lat]
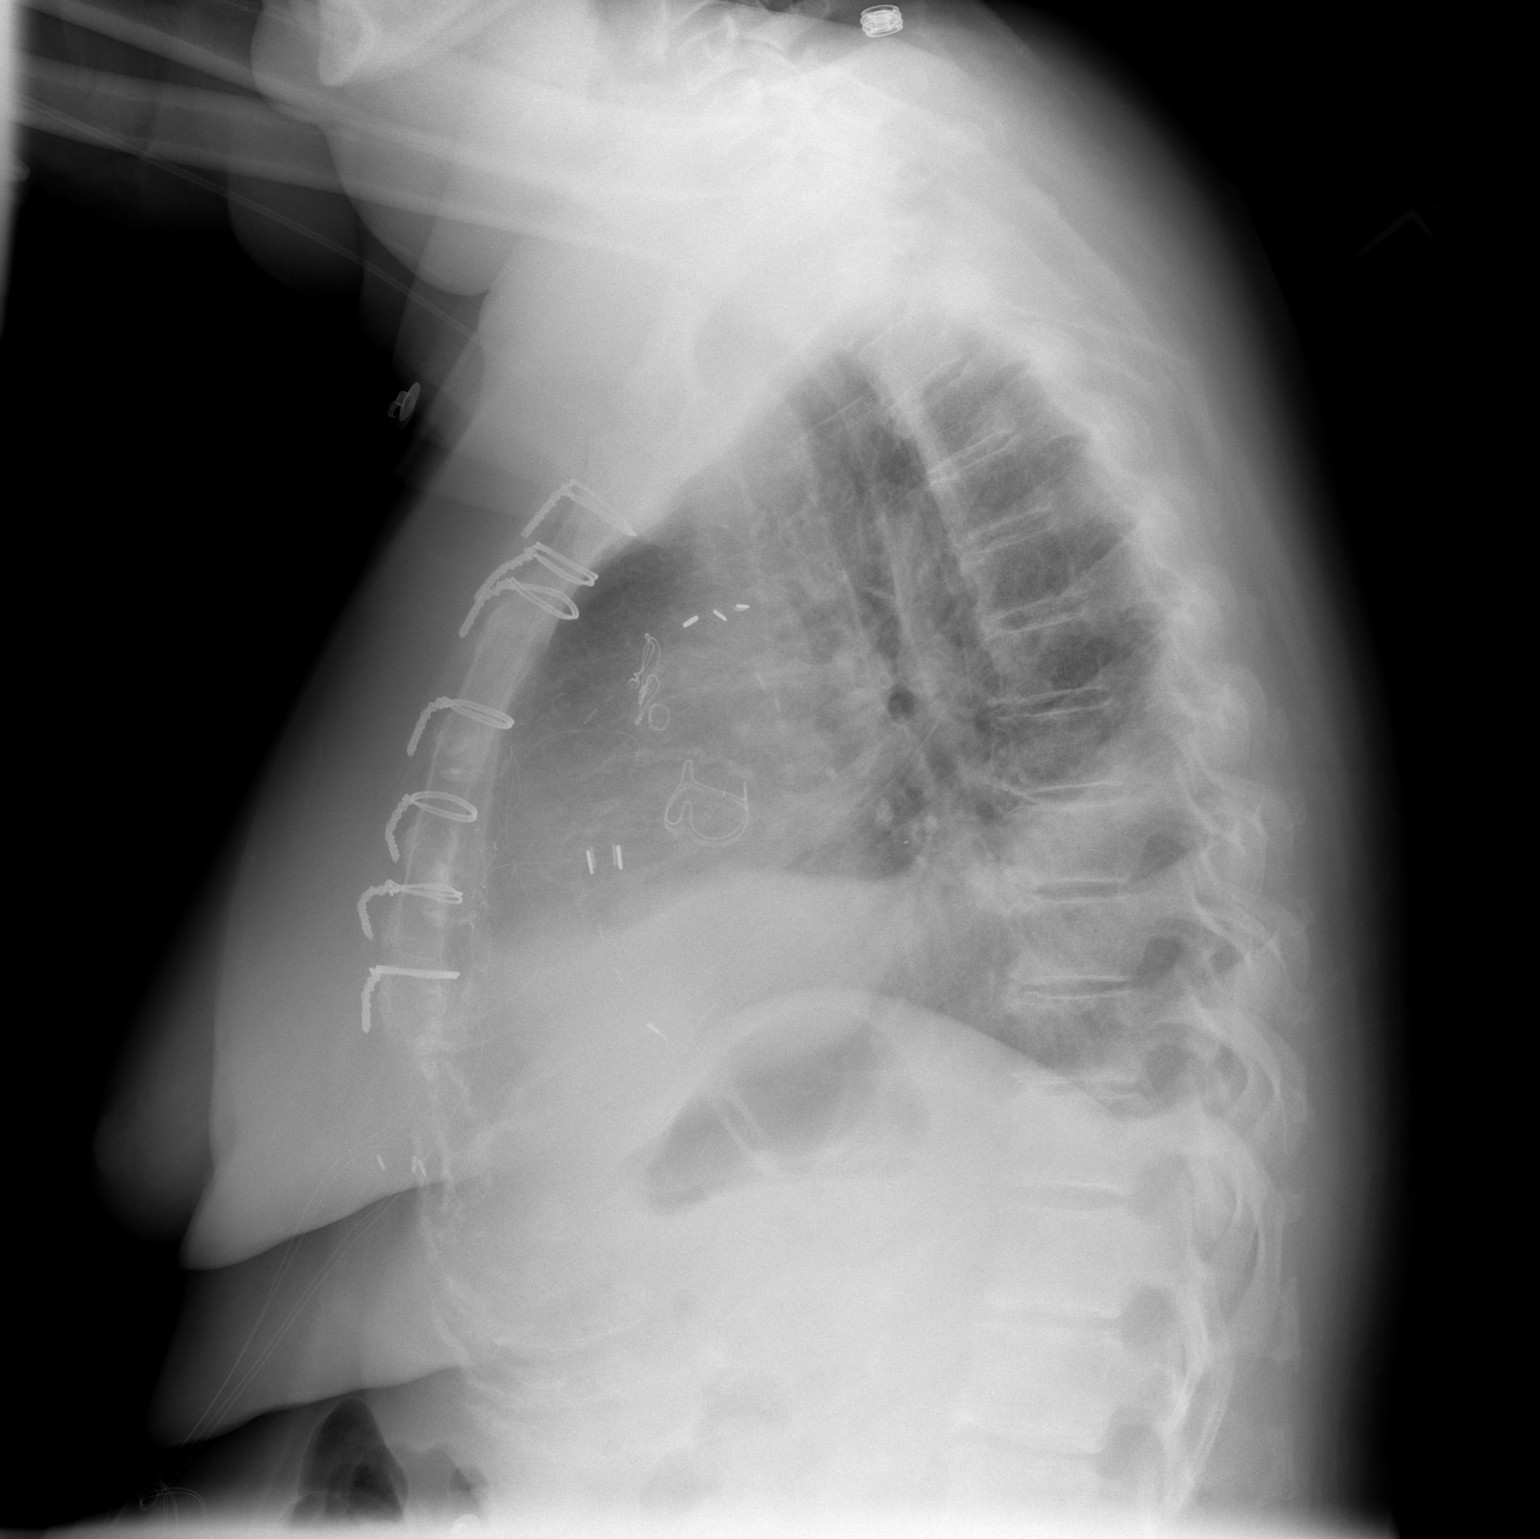

[2 of 2 positions shown; findings below may reference images not displayed]

FINDINGS: Right upper extremity PICC unchanged.  Significant
improvement in aeration of the right lung base, with decreasing
pleural effusion.  Some of the decreased effusion is likely
secondary to positional shift with upright positioning.
Bioprosthetic aortic valve.  Cardiomegaly.  Right base atelectasis.
Tiny left pleural effusion. No edema.
IMPRESSION: 1.  Substantial improvement in aeration of the right lung base.
2.  Unchanged PICC.
3.  Small to moderate right pleural effusion.
4.  Postsurgical changes of the chest.

## 2010-01-01 IMAGING — CR DG CHEST 2V
2 series · 2 of 2 positions shown · non-contrast
Comparison: 04/12/2008 study

CLINICAL DATA: History given of coronary artery disease status post
coronary bypass grafting.

CHEST - 2 VIEW

[w chest pa]
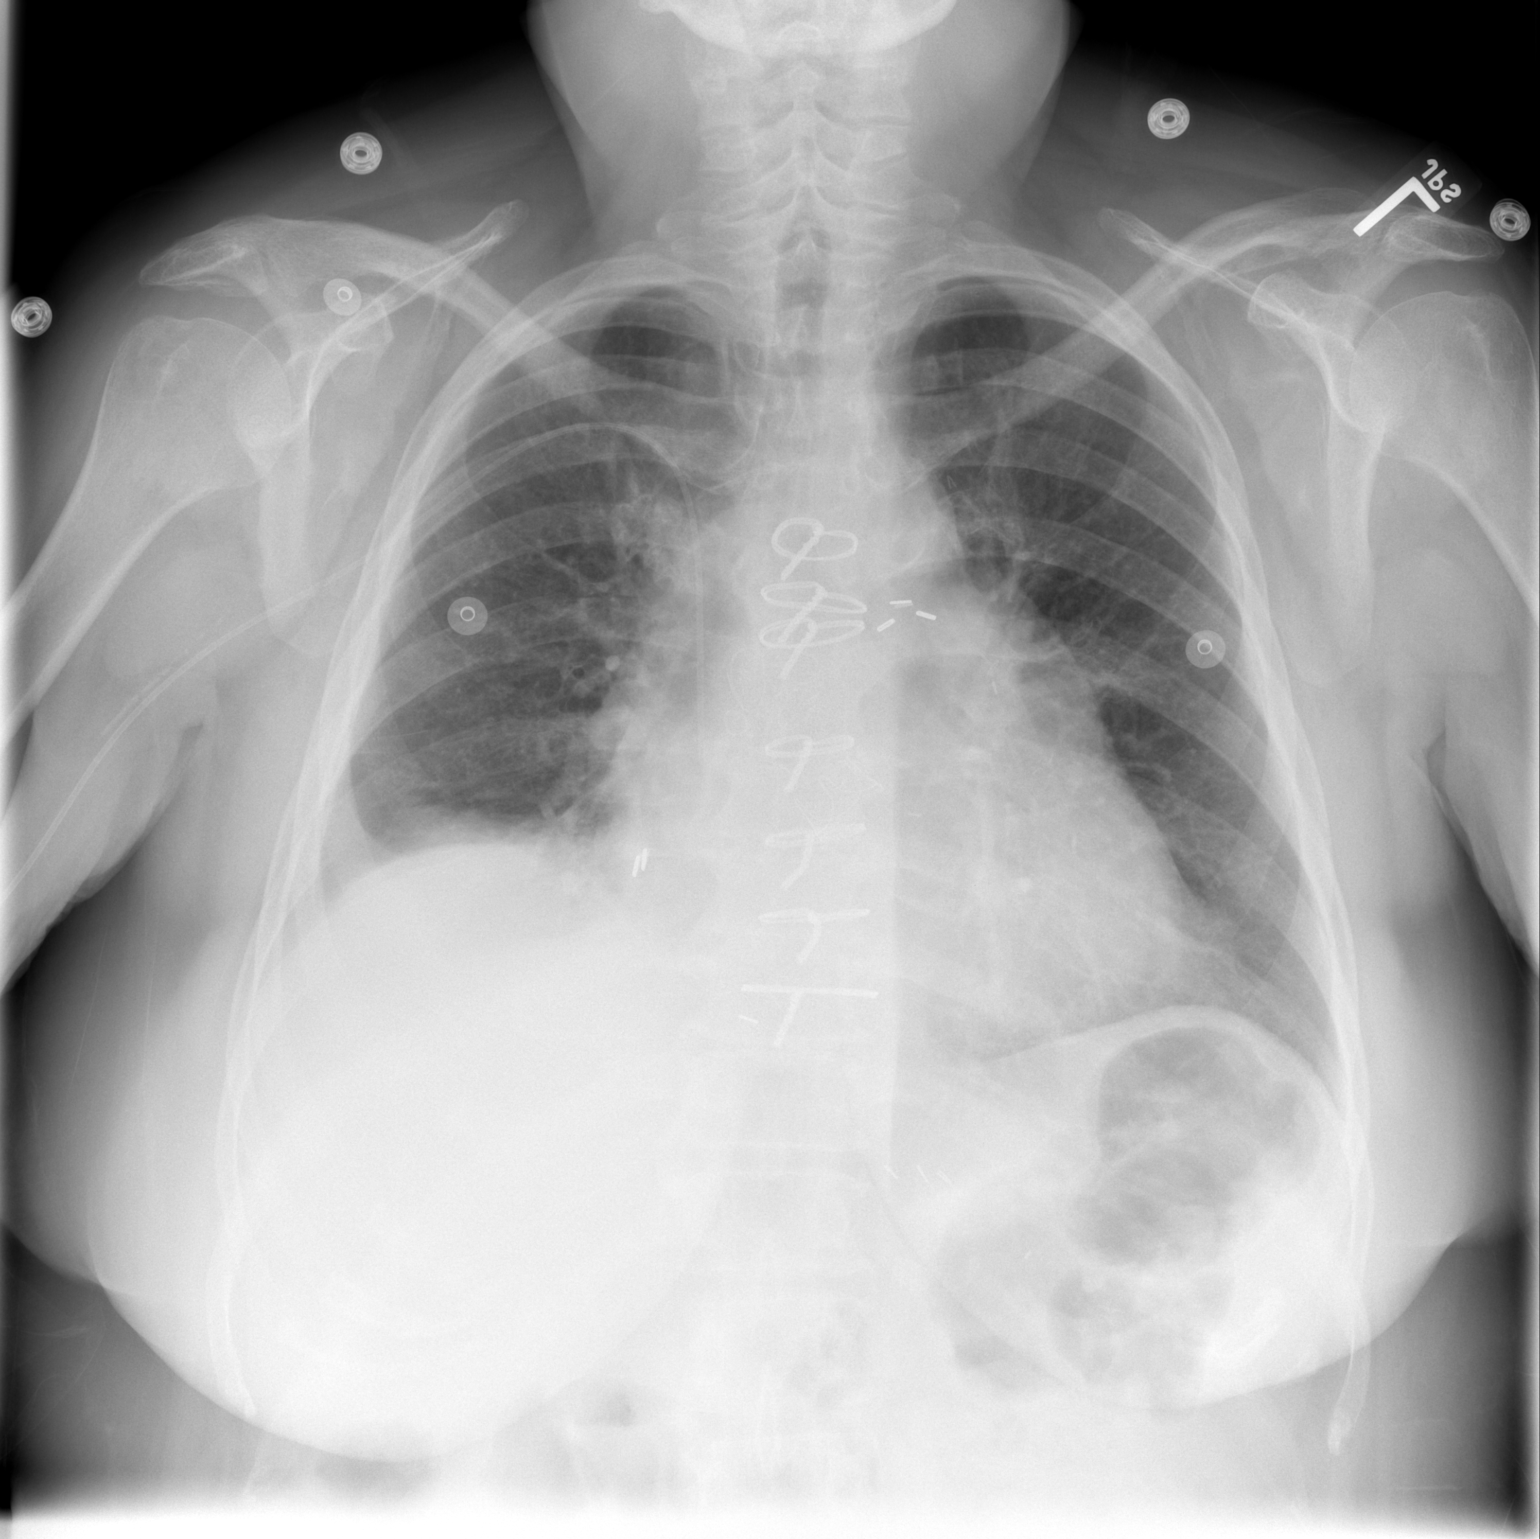

[w chest lat]
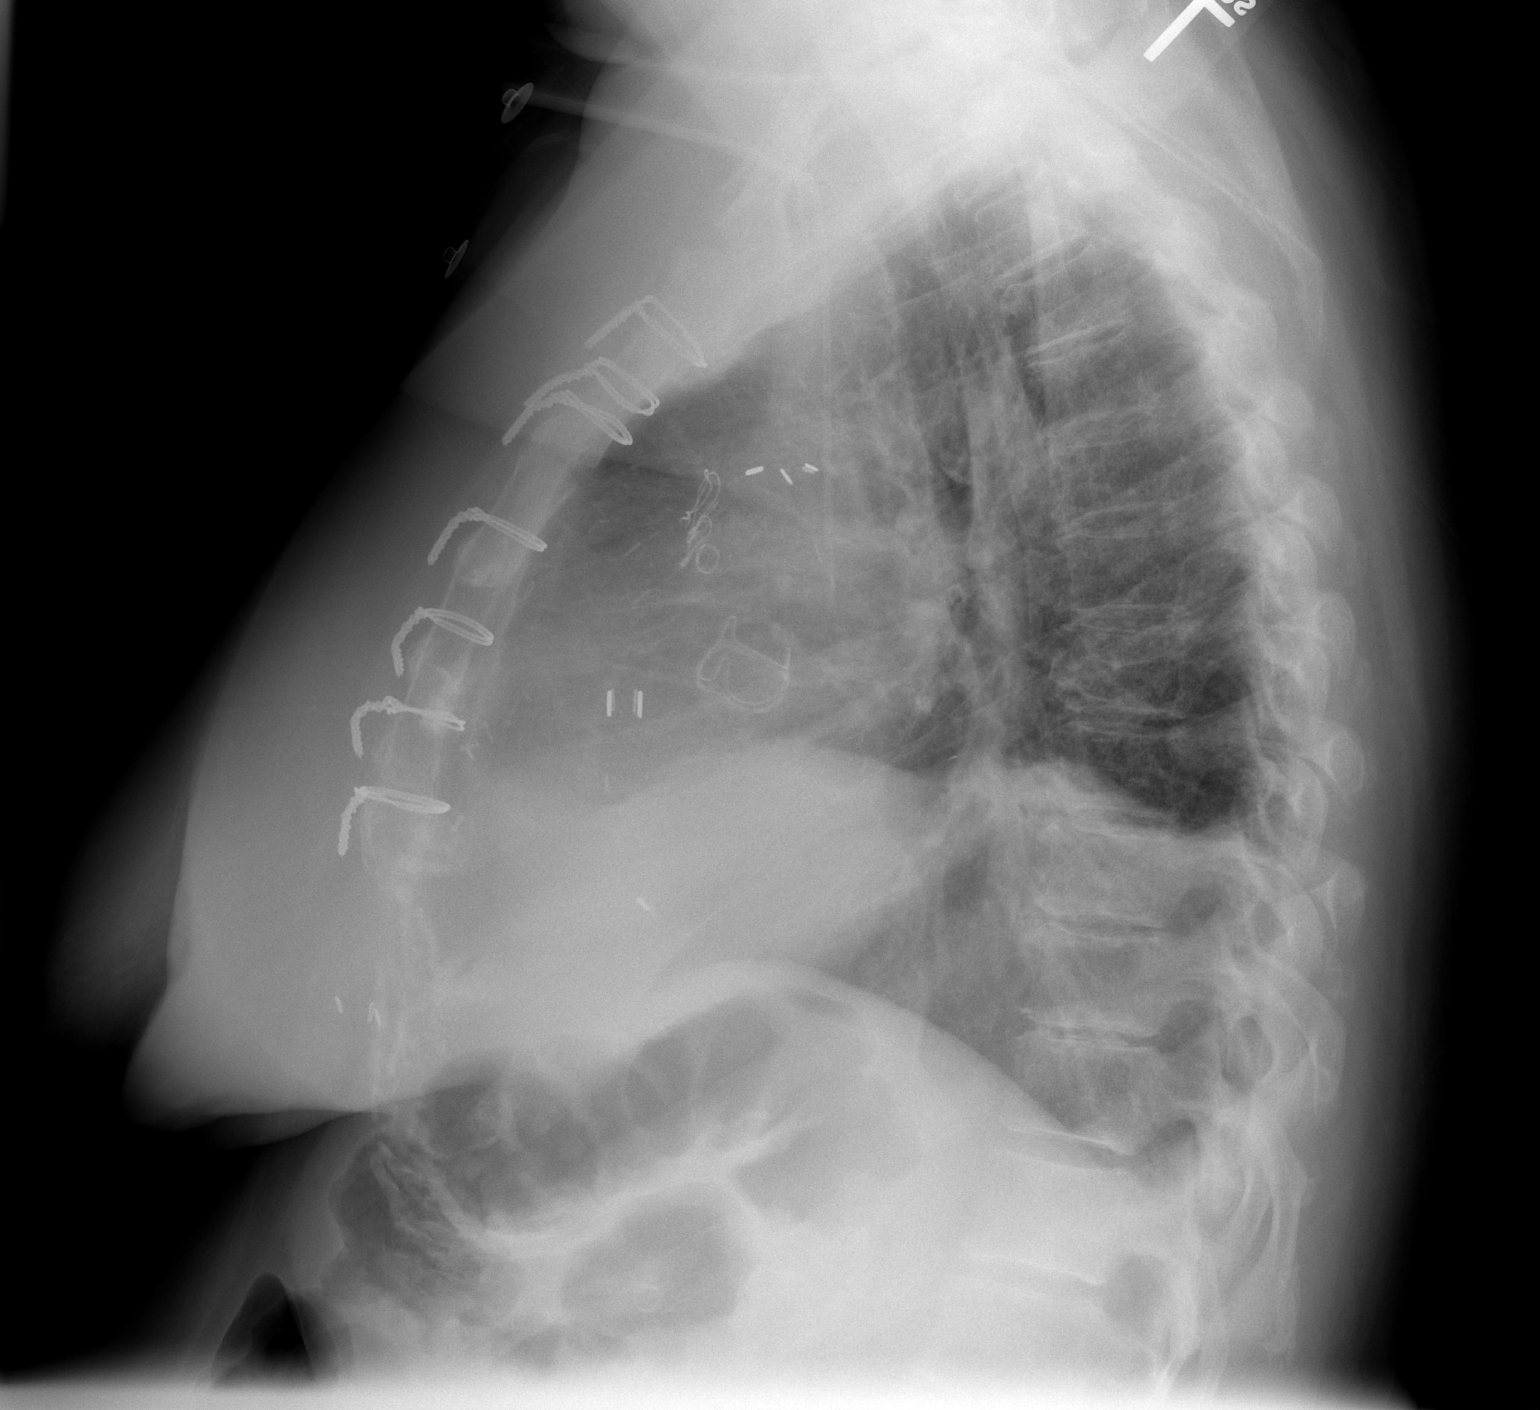

[2 of 2 positions shown; findings below may reference images not displayed]

FINDINGS: PICC is in place entering from the right with tip at
cavoatrial junction.  No pneumothorax is evident.  Previous median
sternotomy and coronary bypass grafting have been performed.  The
cardiac silhouette is mildly enlarged.  There is elevation of the
right hemidiaphragm with minimal atelectasis and infiltrate in the
right base but it appears improved compared to the prior study.
Left lung is free of infiltrates.  Aortic valvular replacement has
been performed.  There is decrease in the right pleural effusion.
Tiny amount of residual posterior costophrenic angle blunting may
reflect a tiny amount of left pleural fluid.
IMPRESSION: PICC is in place.  Mild enlargement of cardiac silhouette is seen.
Minimal atelectasis and infiltrate in the right base with
improvement compared previous study is seen.  Decreased amount of
pleural effusion is seen.

## 2010-01-13 IMAGING — CR DG CHEST 2V
2 series · 2 of 2 positions shown · non-contrast
Comparison: 04/15/2008

CLINICAL DATA: Bypass surgery

CHEST - 2 VIEW

[w chest pa]
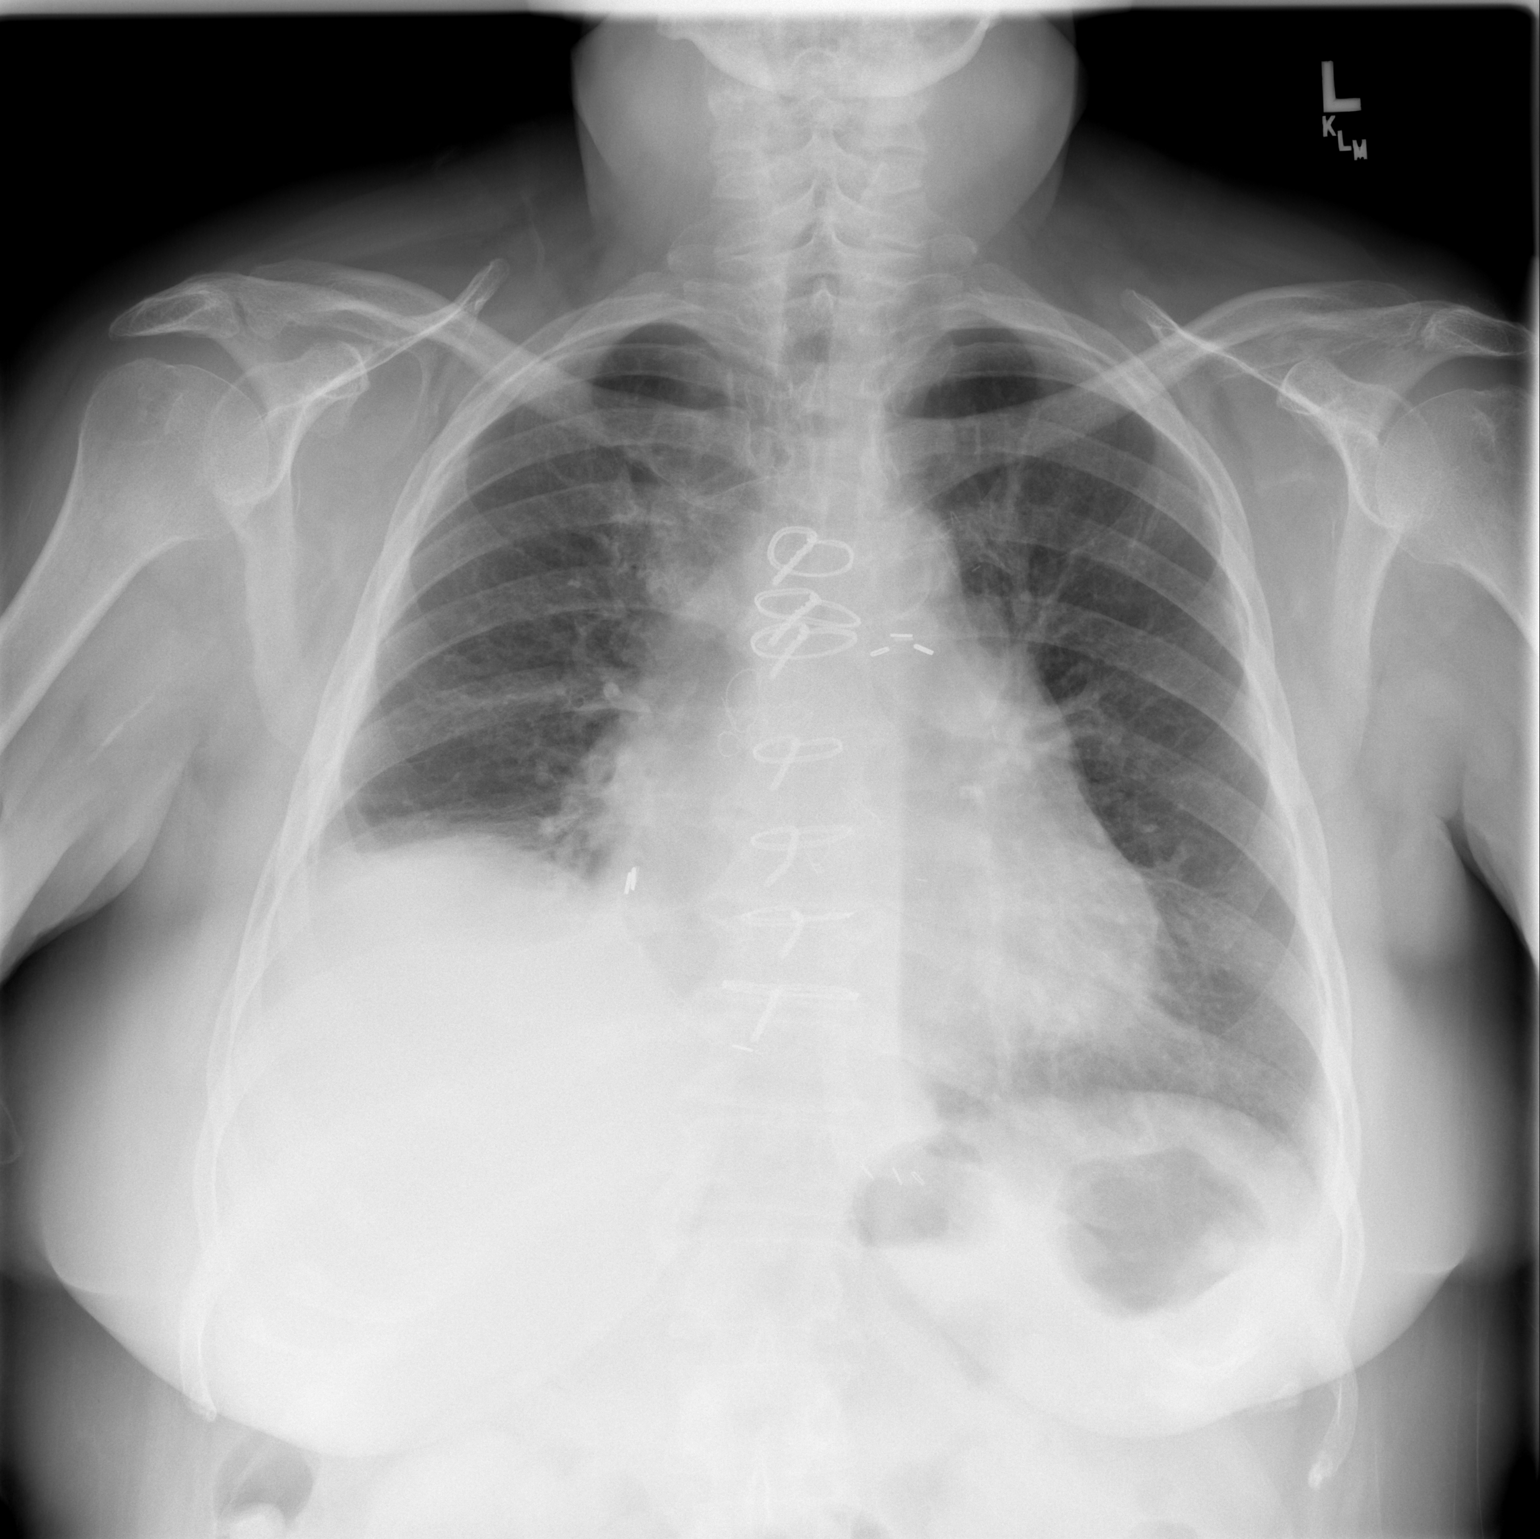

[w chest lat]
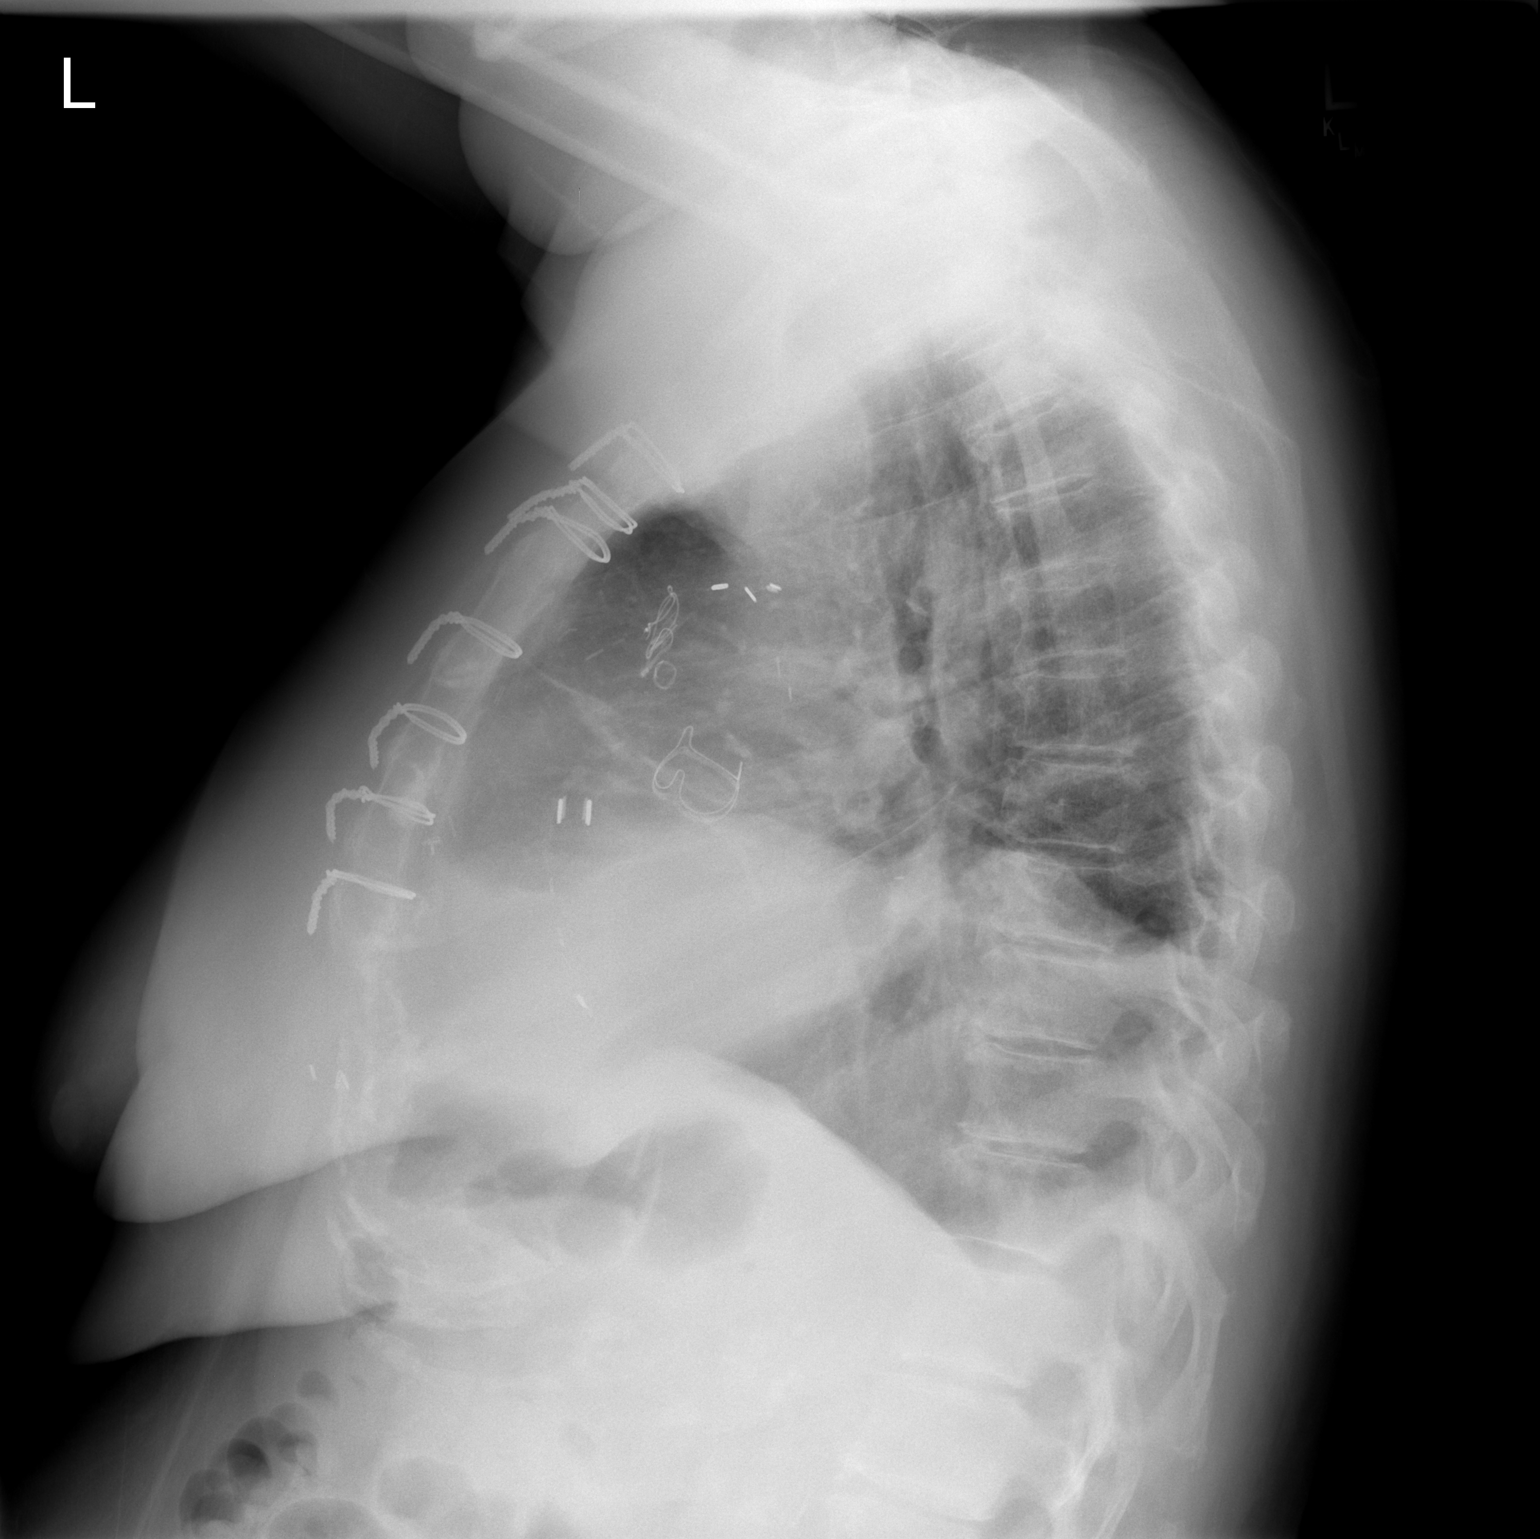

[2 of 2 positions shown; findings below may reference images not displayed]

FINDINGS: The right PICC line has been removed.  There are stable
surgical changes.  Persistent mild elevation of right hemidiaphragm
with overlying small effusion and atelectasis.  No pulmonary edema.
No pneumothorax.  The bony thorax is intact.
IMPRESSION: 1.  Stable surgical changes.
2.  Small right pleural effusion with overlying atelectasis.
3.  No edema or pneumothorax.

## 2010-01-18 ENCOUNTER — Ambulatory Visit: Payer: Self-pay | Admitting: Cardiology

## 2010-02-07 ENCOUNTER — Telehealth (INDEPENDENT_AMBULATORY_CARE_PROVIDER_SITE_OTHER): Payer: Self-pay | Admitting: *Deleted

## 2010-03-09 ENCOUNTER — Ambulatory Visit: Payer: Self-pay | Admitting: Emergency Medicine

## 2010-03-23 ENCOUNTER — Emergency Department (HOSPITAL_COMMUNITY): Admission: EM | Admit: 2010-03-23 | Discharge: 2010-03-23 | Payer: Self-pay | Admitting: Emergency Medicine

## 2010-05-17 ENCOUNTER — Telehealth: Payer: Self-pay | Admitting: Emergency Medicine

## 2010-05-17 ENCOUNTER — Ambulatory Visit: Payer: Self-pay | Admitting: Emergency Medicine

## 2010-05-22 ENCOUNTER — Ambulatory Visit: Payer: Self-pay | Admitting: Cardiology

## 2010-06-09 ENCOUNTER — Telehealth: Payer: Self-pay | Admitting: Emergency Medicine

## 2010-07-11 NOTE — Assessment & Plan Note (Signed)
Summary: asthma, cough, allergies   Visit Type:  Follow-up Copy to:  Dr. Romeo Apple Primary Provider/Referring Provider:  [place]  CC:  The patient is here for follow-up and says her SOB has gotten worse x1-2 weeks and worse when lying down. She still has a cough..  History of Present Illness: 65 yo never smoker, hx of CAD/CABG, aortic valve replacement, A Fib. Also with hx of childhood asthma. She was diagnosed with adult asthma in her 62's in setting wheeze, SOB, cough that wouldn't go away. Was on prn SABA for a while, started on Symbicort early 2009. Had PFT's done in August 2009 and 2010 that showed mixed disease. In 10/10 FEV1 1.01L.   Averages Prednisone about 3 - 4x a year. Never hospitalized or on MV due to breathing. Amiodarone stopped late 2010.   ROV 10/14/09 -- returns for f/u of her asthma, restrictive disease. Tells me that she has had PND, allergy symptoms this spring, making her mor SOB and giving her more cough.   Current Medications (verified): 1)  Lisinopril 5 Mg Tabs (Lisinopril) .... 1/2 By Mouth Daily 2)  Citalopram Hydrobromide 20 Mg Tabs (Citalopram Hydrobromide) .Marland Kitchen.. 1 By Mouth Daily 3)  Bystolic 5 Mg Tabs (Nebivolol Hcl) .Marland Kitchen.. 1 By Mouth Daily 4)  Norvasc 10 Mg Tabs (Amlodipine Besylate) .Marland Kitchen.. 1 By Mouth Daily 5)  Lipitor 10 Mg Tabs (Atorvastatin Calcium) .Marland Kitchen.. 1 By Mouth Daily 6)  Multaq 400 Mg Tabs (Dronedarone Hcl) .Marland Kitchen.. 1 By Mouth Two Times A Day 7)  Lasix 80 Mg Tabs (Furosemide) .Marland Kitchen.. 1 By Mouth Two Times A Day 8)  Aspirin 325 Mg Tabs (Aspirin) .Marland Kitchen.. 1 By Mouth Daily 9)  Apidra 100 Unit/ml Soln (Insulin Glulisine) .... 5 Units Before Meals and At Bedtime 10)  Lantus 100 Unit/ml Soln (Insulin Glargine) .... 60 Units Every Morning 11)  Symbicort 160-4.5 Mcg/act Aero (Budesonide-Formoterol Fumarate) .... 2 Puffs Two Times A Day 12)  Ventolin Hfa 108 (90 Base) Mcg/act Aers (Albuterol Sulfate) .... 2 Puffs Every 4 Hours As Needed 13)   Ipratropium-Albuterol 0.5-2.5 (3) Mg/59ml Soln (Ipratropium-Albuterol) .Marland Kitchen.. 1 Vial in Nebulizer Every 6 Hours As Needed 14)  Allopurinol 100 Mg Tabs (Allopurinol) .Marland Kitchen.. 1 By Mouth Daily 15)  Vitamin D (Ergocalciferol) 50000 Unit Caps (Ergocalciferol) .Marland Kitchen.. 1 By Mouth Weekly  Allergies (verified): 1)  ! Amoxicillin  Vital Signs:  Patient profile:   65 year old female Height:      64 inches (162.56 cm) Weight:      223 pounds (101.36 kg) BMI:     38.42 O2 Sat:      97 % on Room air Temp:     98.3 degrees F (36.83 degrees C) oral Pulse rate:   67 / minute BP sitting:   130 / 78  (left arm) Cuff size:   large  Vitals Entered By: Francesca Jewett CMA (Oct 14, 2009 1:30 PM)  O2 Sat at Rest %:  97 O2 Flow:  Room air CC: The patient is here for follow-up and says her SOB has gotten worse x1-2 weeks and worse when lying down. She still has a cough. Comments Medications reviewed with the patient. Daytime phone number verified. Francesca Jewett CMA  Oct 14, 2009 1:31 PM   Physical Exam  General:  normal appearance, healthy appearing, and obese.  frequent throat clearing Head:  normocephalic and atraumatic Eyes:  conjunctiva and sclera clear Nose:  no deformity, discharge, inflammation, or lesions Mouth:  no deformity  or lesions Neck:  no masses, thyromegaly, or abnormal cervical nodes Lungs:  clear bilaterally to auscultation during the normal resp cycle, no wheeze on forced exp Heart:  irreg, distant, no M Abdomen:  not examined Msk:  no deformity or scoliosis noted with normal posture Extremities:  trace LE edema Neurologic:  non-focal Skin:  intact without lesions or rashes Psych:  alert and cooperative; normal mood and affect; normal attention span and concentration   Impression & Recommendations:  Problem # 1:  ASTHMA (ICD-493.90) Some slight decompensation likely due to allergy season. No acute exacerbation - same symbicort and ventoilin - control allergies as below  Problem # 2:   COUGH (ICD-786.2)  Due primarily to allergies + lisinopril (low dose) - trial NSW + loratadine + flutoicasone spray - if cough continues then will discus with Dr Posey Pronto changing ACE-I to ARB  Orders: Est. Patient Level IV [id number])  Problem # 3:  ALLERGIC RHINITIS (ICD-477.9)  Orders: Est. Patient Level IV [id number])  Her updated medication list for this problem includes:    Loratadine 10 Mg Tabs (Loratadine) .Marland Kitchen... 1 by mouth once daily    Fluticasone Propionate 50 Mcg/act Susp (Fluticasone propionate) .Marland Kitchen... 2 sprays each side two times a day  Medications Added to Medication List This Visit: 1)  Lisinopril 5 Mg Tabs (Lisinopril) .... 1/2 by mouth daily 2)  Allopurinol 100 Mg Tabs (Allopurinol) .Marland Kitchen.. 1 by mouth daily 3)  Vitamin D (ergocalciferol) 50000 Unit Caps (Ergocalciferol) .Marland Kitchen.. 1 by mouth weekly 4)  Loratadine 10 Mg Tabs (Loratadine) .Marland Kitchen.. 1 by mouth once daily 5)  Fluticasone Propionate 50 Mcg/act Susp (Fluticasone propionate) .... 2 sprays each side two times a day  Patient Instructions: 1)  Start doing nasal saline rinses once daily  2)  Start loratadine 10mg  by mouth once daily  3)  Start fluticasone spray, 2 sprays each nostril two times a day  4)  Continue your Symbicort two times a day  5)  Use Ventolin as needed  6)  If your cough continues we will discuss changing your lisinopril with Dr Posey Pronto 7)  Follow with Dr Lamonte Sakai in 4 months or as needed  Prescriptions: FLUTICASONE PROPIONATE 50 MCG/ACT SUSP (FLUTICASONE PROPIONATE) 2 sprays each side two times a day  #1 x 11   Entered and Authorized by:   Collene Gobble MD   Signed by:   Collene Gobble MD on 10/14/2009   Method used:   Electronically to        [facility] (retail)       [phone]       [place], [place]  [address]       Ph: [phone]       Fax: [phone]   RxID:   [id number] LORATADINE 10 MG TABS (LORATADINE) 1 by mouth once daily  #30 x 11   Entered  and Authorized by:   Collene Gobble MD   Signed by:   Collene Gobble MD on 10/14/2009   Method used:   Electronically to        [facility] (retail)       [phone]       [place], [place]  [address]       Ph: [phone]       Fax: [phone]   RxID:   [id number]

## 2010-07-11 NOTE — Letter (Signed)
Summary: [facility]  [facility]   Imported By: Bubba Hales 06/27/2009 08:20:04  _____________________________________________________________________  External Attachment:    Type:   Image     Comment:   External Document

## 2010-07-11 NOTE — Progress Notes (Signed)
Summary: coughing  Phone Note Call from Patient Call back at [phone]   Caller: Patient Call For: Quinnlan Abruzzo Summary of Call: Pt states her asthma is acting up c/o coughing up yellow/greenish sputum, not sleeping for several days wants to be seen today pls advise.//[facility] Initial call taken by: Netta Neat,  May 17, 2010 8:26 AM  Follow-up for Phone Call        pt c/o productive cough, wheezing, increased use of albuterol inhaler to 2-3 times daily. Pt requesting an appt. I spoke to Devol and she ok to double at 1:30. Pt aware of appt.[place] Bing CMA  May 17, 2010 10:00 AM

## 2010-07-11 NOTE — Progress Notes (Signed)
  Phone Note Other Incoming   Request: Send information Summary of Call: Request for records received from [facility]. Request forwarded to [facility].     

## 2010-07-11 NOTE — Assessment & Plan Note (Signed)
Summary: URI, asthma exacerbation   Visit Type:  Acute visit Copy to:  Dr. Romeo Apple Primary Provider/Referring Provider:  [facility]  CC:  Acute visit for cough and congestion...prod cough w/ yellow sputum and wheezing and incr SOB.  History of Present Illness: 65 yo never smoker, hx of CAD/CABG, aortic valve replacement, A Fib. Also with hx of childhood asthma. She was diagnosed with adult asthma in her 45's in setting wheeze, SOB, cough that wouldn't go away. Was on prn SABA for a while, started on Symbicort early 2009. Had PFT's done in August 2009 and 2010 that showed mixed disease. In 10/10 FEV1 1.01L.   Averages Prednisone about 3 - 4x a year. Never hospitalized or on MV due to breathing. Amiodarone stopped late 2010.   ROV 10/14/09 -- returns for f/u of her asthma, restrictive disease. Tells me that she has had PND, allergy symptoms this spring, making her mor SOB and giving her more cough.   ROV 03/07/10 -- Hx CAD/CABG, asthma and allergies. Still with some nasal congestion, doing loratadine and fluticasone spray, rarely NSW. Does some throat clearing. Taking her symbicort reliably. Uses ventolin a couple times a month.   ROV 05/17/10 -- hx asthma, allergies, managed on Symbicort and nasal steroid, loratadine. She is on lisinopril. Here with acute visit. Over the last 3 -4 days has had more cough, wheeze, denies real SOB. Her husband has a URI, she is having more clear nasal drainage. Has been taking her SABA 3x a day.   Preventive Screening-Counseling & Management  Alcohol-Tobacco     Alcohol drinks/day: 0     Smoking Status: never  Current Medications (verified): 1)  Lisinopril 5 Mg Tabs (Lisinopril) .... 1/2 By Mouth Daily 2)  Citalopram Hydrobromide 20 Mg Tabs (Citalopram Hydrobromide) .Marland Kitchen.. 1 By Mouth Daily 3)  Bystolic 5 Mg Tabs (Nebivolol Hcl) .Marland Kitchen.. 1 By Mouth Daily 4)  Norvasc 10 Mg Tabs (Amlodipine Besylate) .Marland Kitchen.. 1 By Mouth Daily 5)  Lipitor 10 Mg Tabs  (Atorvastatin Calcium) .Marland Kitchen.. 1 By Mouth Daily 6)  Multaq 400 Mg Tabs (Dronedarone Hcl) .Marland Kitchen.. 1 By Mouth Two Times A Day 7)  Lasix 80 Mg Tabs (Furosemide) .Marland Kitchen.. 1 By Mouth Two Times A Day 8)  Aspirin 325 Mg Tabs (Aspirin) .Marland Kitchen.. 1 By Mouth Daily 9)  Apidra 100 Unit/ml Soln (Insulin Glulisine) .... 5 Units Before Meals and At Bedtime 10)  Lantus 100 Unit/ml Soln (Insulin Glargine) .... 60 Units Every Morning 11)  Symbicort 160-4.5 Mcg/act Aero (Budesonide-Formoterol Fumarate) .... 2 Puffs Two Times A Day 12)  Ventolin Hfa 108 (90 Base) Mcg/act Aers (Albuterol Sulfate) .... 2 Puffs Every 4 Hours As Needed 13)  Ipratropium-Albuterol 0.5-2.5 (3) Mg/77ml Soln (Ipratropium-Albuterol) .Marland Kitchen.. 1 Vial in Nebulizer Every 6 Hours As Needed 14)  Allopurinol 100 Mg Tabs (Allopurinol) .Marland Kitchen.. 1 By Mouth Daily 15)  Vitamin D (Ergocalciferol) 50000 Unit Caps (Ergocalciferol) .Marland Kitchen.. 1 By Mouth Per Month 16)  Loratadine 10 Mg Tabs (Loratadine) .Marland Kitchen.. 1 By Mouth Once Daily 17)  Fluticasone Propionate 50 Mcg/act Susp (Fluticasone Propionate) .... 2 Sprays Each Side Two Times A Day 18)  Saline Nasal Wash .... Daily As Needed  Allergies (verified): 1)  ! Amoxicillin 2)  ! Tetracycline  Vital Signs:  Patient profile:   65 year old female Height:      64 inches (162.56 cm) Weight:      235 pounds (106.82 kg) BMI:     40.48 O2 Sat:  95 % on Room air Temp:     98.2 degrees F (36.78 degrees C) oral Pulse rate:   74 / minute BP sitting:   128 / 76  (left arm) Cuff size:   regular  Vitals Entered By: Francesca Jewett CMA (May 17, 2010 1:23 PM)  O2 Sat at Rest %:  95 O2 Flow:  Room air CC: Acute visit for cough and congestion...prod cough w/ yellow sputum, wheezing and incr SOB Comments Medications reviewed with patient Francesca Jewett [facility]  May 17, 2010 1:29 PM   Physical Exam  General:  normal appearance, healthy appearing, and obese.  occasional throat clearing Head:  normocephalic and atraumatic Eyes:   conjunctiva and sclera clear Nose:  no deformity, discharge, inflammation, or lesions Mouth:  no deformity or lesions Neck:  no masses, thyromegaly, or abnormal cervical nodes, some UA noise Lungs:  low pitched exp wheeze, referred UA noise Heart:  irreg, distant, no M Abdomen:  not examined Msk:  no deformity or scoliosis noted with normal posture Extremities:  trace LE edema Neurologic:  non-focal Skin:  intact without lesions or rashes Psych:  alert and cooperative; normal mood and affect; normal attention span and concentration   Impression & Recommendations:  Problem # 1:  ASTHMA (ICD-493.90) With mild flare from apparenyt URI - short burst pred - symptomatic meds for congestion - azithro x 5 days - llisinopril holifday x 2 weeks - rov 6 mo or as needed   Medications Added to Medication List This Visit: 1)  Prednisone 50 Mg Tabs (Prednisone) .Marland Kitchen.. 1 by mouth once daily x 5 days 2)  Azithromycin 250 Mg Tabs (Azithromycin) .... 2 by mouth on the first day, then 1 by mouth once daily until gone  Other Orders: Est. Patient Level IV [id number]  Patient Instructions: 1)  Start prednisone for the next 5 days 2)  Take azithromycin for the next 5 days 3)  Stop your lisinopril for the next 2 weeks  4)  Continue your Symbicort and Ventolin 5)  Follow up in 6 months or as needed  Prescriptions: AZITHROMYCIN 250 MG TABS (AZITHROMYCIN) 2 by mouth on the first day, then 1 by mouth once daily until gone  #6 x 0   Entered and Authorized by:   Collene Gobble MD   Signed by:   Collene Gobble MD on 05/17/2010   Method used:   Electronically to        [facility]  [address] [phone][facility] (retail)       [place], [place]  [address]       Ph: [phone] or [phone]       Fax: [phone]   RxID:   [id number] PREDNISONE 50 MG TABS (PREDNISONE) 1 by mouth once daily x 5 days  #5 x 0   Entered and Authorized by:   Collene Gobble MD   Signed by:   Collene Gobble MD on  05/17/2010   Method used:   [facility] to        [facility]  [address] [phone] (retail)       [place], [place]  [address]       Ph: [phone] or [phone]       Fax: [phone]   RxID:   [id number]    Immunization  History:  Influenza Immunization History:    Influenza:  historical (03/11/2009)  Pneumovax Immunization History:    Pneumovax:  historical (06/12/2007)

## 2010-07-11 NOTE — Assessment & Plan Note (Signed)
Summary: asthma, allergies   Visit Type:  Follow-up Copy to:  Dr. Romeo Apple Primary Provider/Referring Provider:  [place]  CC:  4 month followup asthma and cough.  Pt states that her breathing is the same- no better or worse.  She still has dry cough- worse at night and with exertion.Hannah [name]  History of Present Illness: 65 yo never smoker, hx of CAD/CABG, aortic valve replacement, A Fib. Also with hx of childhood asthma. She was diagnosed with adult asthma in her 20's in setting wheeze, SOB, cough that wouldn't go away. Was on prn SABA for a while, started on Symbicort early 2009. Had PFT's done in August 2009 and 2010 that showed mixed disease. In 10/10 FEV1 1.01L.   Averages Prednisone about 3 - 4x a year. Never hospitalized or on MV due to breathing. Amiodarone stopped late 2010.   ROV 10/14/09 -- returns for f/u of her asthma, restrictive disease. Tells me that she has had PND, allergy symptoms this spring, making her mor SOB and giving her more cough.   ROV 03/07/10 -- Hx CAD/CABG, asthma and allergies. Still with some nasal congestion, doing loratadine and fluticasone spray, rarely [place]. Does some throat clearing. Taking her symbicort reliably. Uses ventolin a couple times a month.   Current Medications (verified): 1)  Lisinopril 5 Mg Tabs (Lisinopril) .... 1/2 By Mouth Daily 2)  Citalopram Hydrobromide 20 Mg Tabs (Citalopram Hydrobromide) .Hannah [name].. 1 By Mouth Daily 3)  Bystolic 5 Mg Tabs (Nebivolol Hcl) .Hannah [name].. 1 By Mouth Daily 4)  Norvasc 10 Mg Tabs (Amlodipine Besylate) .Hannah [name].. 1 By Mouth Daily 5)  Lipitor 10 Mg Tabs (Atorvastatin Calcium) .Hannah [name].. 1 By Mouth Daily 6)  Multaq 400 Mg Tabs (Dronedarone Hcl) .Hannah [name].. 1 By Mouth Two Times A Day 7)  Lasix 80 Mg Tabs (Furosemide) .Hannah [name].. 1 By Mouth Two Times A Day 8)  Aspirin 325 Mg Tabs (Aspirin) .Hannah [name].. 1 By Mouth Daily 9)  Apidra 100 Unit/ml Soln (Insulin Glulisine) .... 5 Units Before Meals and At Bedtime 10)  Lantus 100 Unit/ml Soln  (Insulin Glargine) .... 60 Units Every Morning 11)  Symbicort 160-4.5 Mcg/act Aero (Budesonide-Formoterol Fumarate) .... 2 Puffs Two Times A Day 12)  Ventolin Hfa 108 (90 Base) Mcg/act Aers (Albuterol Sulfate) .... 2 Puffs Every 4 Hours As Needed 13)  Ipratropium-Albuterol 0.5-2.5 (3) Mg/43ml Soln (Ipratropium-Albuterol) .Hannah [name].. 1 Vial in Nebulizer Every 6 Hours As Needed 14)  Allopurinol 100 Mg Tabs (Allopurinol) .Hannah [name].. 1 By Mouth Daily 15)  Vitamin D (Ergocalciferol) 50000 Unit Caps (Ergocalciferol) .Hannah [name].. 1 By Mouth Per Month 16)  Loratadine 10 Mg Tabs (Loratadine) .Hannah [name].. 1 By Mouth Once Daily 17)  Fluticasone Propionate 50 Mcg/act Susp (Fluticasone Propionate) .... 2 Sprays Each Side Two Times A Day 18)  Saline Nasal Wash .... Daily As Needed  Allergies (verified): 1)  ! Amoxicillin 2)  ! Tetracycline  Vital Signs:  Patient profile:   65 year old female Weight:      232.50 pounds O2 Sat:      96 % on Room air Temp:     98.3 degrees F oral Pulse rate:   66 / minute BP sitting:   100 / 62  (left arm) Cuff size:   large  Vitals Entered By: Tilden Dome (March 09, 2010 4:12 PM)  O2 Flow:  Room air  Physical Exam  General:  normal appearance, healthy appearing, and obese.  occasional throat clearing Head:  normocephalic and atraumatic Eyes:  conjunctiva and sclera clear Nose:  no deformity, discharge, inflammation, or lesions Mouth:  no deformity or lesions Neck:  no masses, thyromegaly, or abnormal cervical nodes Lungs:  clear bilaterally to auscultation during the normal resp cycle, cough on forced exp Heart:  irreg, distant, no M Abdomen:  not examined Msk:  no deformity or scoliosis noted with normal posture Extremities:  trace LE edema Neurologic:  non-focal Skin:  intact without lesions or rashes Psych:  alert and cooperative; normal mood and affect; normal attention span and concentration    Impression & Recommendations:  Problem # 1:  ASTHMA (ICD-493.90) Symbicort  two times a day + ventiolin as needed   Problem # 2:  ALLERGIC RHINITIS (ICD-477.9)  Her updated medication list for this problem includes:    Loratadine 10 Mg Tabs (Loratadine) .Hannah [name]... 1 by mouth once daily    Fluticasone Propionate 50 Mcg/act Susp (Fluticasone propionate) .Hannah [name]... 2 sprays each side two times a day  Orders: Est. Patient Level IV [id number]  Medications Added to Medication List This Visit: 1)  Vitamin D (ergocalciferol) 50000 Unit Caps (Ergocalciferol) .Hannah [name].. 1 by mouth per month 2)  Saline Nasal Wash  .... Daily as needed  Patient Instructions: 1)  Continue your Symbicort two times a day  2)  Use your ventolin as needed  3)  Continue your loratadine and fluticasone spray as you are taking them. You could consider stopping the fluticasone this winter to see if you miss it.  4)  Follow up with Dr Lamonte Sakai in 6 months or as needed

## 2010-07-11 NOTE — Progress Notes (Signed)
Summary: prescription question  Phone Note From Pharmacy Call back at [phone]   Caller: Palmer.* Call For: byrum  Summary of Call: Loratadine is increasing the effects of another med on pt's list, need to know how to proceed. Initial call taken by: Netta Neat,  Oct 18, 2009 9:45 AM  Follow-up for Phone Call        per Caryl Pina at pharmacy the pt is taking Pacerone 200mg  one tab two times a day and loratidine will increase the effects of this med. They want to know do you still want them to dispense the loratidine? Pelase advise. Woodland Bing CMA  Oct 18, 2009 10:01 AM    Additional Follow-up for Phone Call Additional follow up Details #1::        Find out how long the pt has been on this medication and who is the prescribing doctor. RB says he may need to speak with the prescribing doctor. [email or web address].Francesca Jewett CMA  Oct 18, 2009 10:59 AM  pt returned call to [place].  I did ask her about the Pacerone, 200mg  and she didn't know what I was talking about.  Said she doesn't have any meds by that name.  i told her the nurse would call her back. Additional Follow-up by: Zigmund Gottron,  Oct 18, 2009 1:11 PM    Additional Follow-up for Phone Call Additional follow up Details #2::    pharmacy aware pt states she does not take pacerone so ok to go ahead with refill of loratadine, Follow-up by: Vamo,  Oct 18, 2009 3:26 PM

## 2010-07-13 NOTE — Progress Notes (Signed)
Summary: Samples of symbicort  Phone Note Call from Patient Call back at [phone]   Caller: Patient Call For: byrum Summary of Call: Patient wants samples of Symbicort 160. Initial call taken by: Jacqualine Code,  June 09, 2010 11:00 AM  Follow-up for Phone Call        pt awaer 2 samples at front for her. Climax Bing CMA  June 09, 2010 11:28 AM

## 2010-08-21 ENCOUNTER — Telehealth (INDEPENDENT_AMBULATORY_CARE_PROVIDER_SITE_OTHER): Payer: Self-pay | Admitting: *Deleted

## 2010-08-24 LAB — POCT I-STAT, CHEM 8
BUN: 40 mg/dL — ABNORMAL HIGH (ref 6–23)
Calcium, Ion: 1.1 mmol/L — ABNORMAL LOW (ref 1.12–1.32)
Chloride: 105 mEq/L (ref 96–112)
Creatinine, Ser: 1.8 mg/dL — ABNORMAL HIGH (ref 0.4–1.2)
Glucose, Bld: 220 mg/dL — ABNORMAL HIGH (ref 70–99)
HCT: 36 % (ref 36.0–46.0)
Hemoglobin: 12.2 g/dL (ref 12.0–15.0)
Potassium: 4.4 mEq/L (ref 3.5–5.1)
Sodium: 137 mEq/L (ref 135–145)
TCO2: 26 mmol/L (ref 0–100)

## 2010-08-24 LAB — DIFFERENTIAL
Basophils Absolute: 0.1 10*3/uL (ref 0.0–0.1)
Basophils Relative: 1 % (ref 0–1)
Eosinophils Absolute: 0.1 10*3/uL (ref 0.0–0.7)
Eosinophils Relative: 1 % (ref 0–5)
Lymphocytes Relative: 6 % — ABNORMAL LOW (ref 12–46)
Lymphs Abs: 0.9 10*3/uL (ref 0.7–4.0)
Monocytes Absolute: 1.2 10*3/uL — ABNORMAL HIGH (ref 0.1–1.0)
Monocytes Relative: 8 % (ref 3–12)
Neutro Abs: 12.9 10*3/uL — ABNORMAL HIGH (ref 1.7–7.7)
Neutrophils Relative %: 85 % — ABNORMAL HIGH (ref 43–77)

## 2010-08-24 LAB — URINE CULTURE
Colony Count: 60000
Culture  Setup Time: 201110131008

## 2010-08-24 LAB — URINALYSIS, ROUTINE W REFLEX MICROSCOPIC
Glucose, UA: NEGATIVE mg/dL
Hgb urine dipstick: NEGATIVE
Ketones, ur: 15 mg/dL — AB
Nitrite: NEGATIVE
Protein, ur: NEGATIVE mg/dL
Specific Gravity, Urine: 1.02 (ref 1.005–1.030)
Urobilinogen, UA: 1 mg/dL (ref 0.0–1.0)
pH: 5 (ref 5.0–8.0)

## 2010-08-24 LAB — CBC
HCT: 35.3 % — ABNORMAL LOW (ref 36.0–46.0)
Hemoglobin: 11.9 g/dL — ABNORMAL LOW (ref 12.0–15.0)
MCH: 29.3 pg (ref 26.0–34.0)
MCHC: 33.7 g/dL (ref 30.0–36.0)
MCV: 86.9 fL (ref 78.0–100.0)
Platelets: 186 10*3/uL (ref 150–400)
RBC: 4.06 MIL/uL (ref 3.87–5.11)
RDW: 12.7 % (ref 11.5–15.5)
WBC: 15.2 10*3/uL — ABNORMAL HIGH (ref 4.0–10.5)

## 2010-08-24 LAB — URINE MICROSCOPIC-ADD ON

## 2010-08-24 LAB — POCT CARDIAC MARKERS
CKMB, poc: <1 ng/mL — ABNORMAL LOW (ref 1.0–8.0)
Myoglobin, poc: 91.9 ng/mL (ref 12–200)
Troponin i, poc: <0.05 ng/mL (ref 0.00–0.09)

## 2010-08-27 IMAGING — CR DG CHEST 2V
2 series · 2 of 2 positions shown · non-contrast
Comparison: 04/27/2008.

CLINICAL DATA: Shortness of breath.

CHEST - 2 VIEW

[view not recorded (1 of 2)]
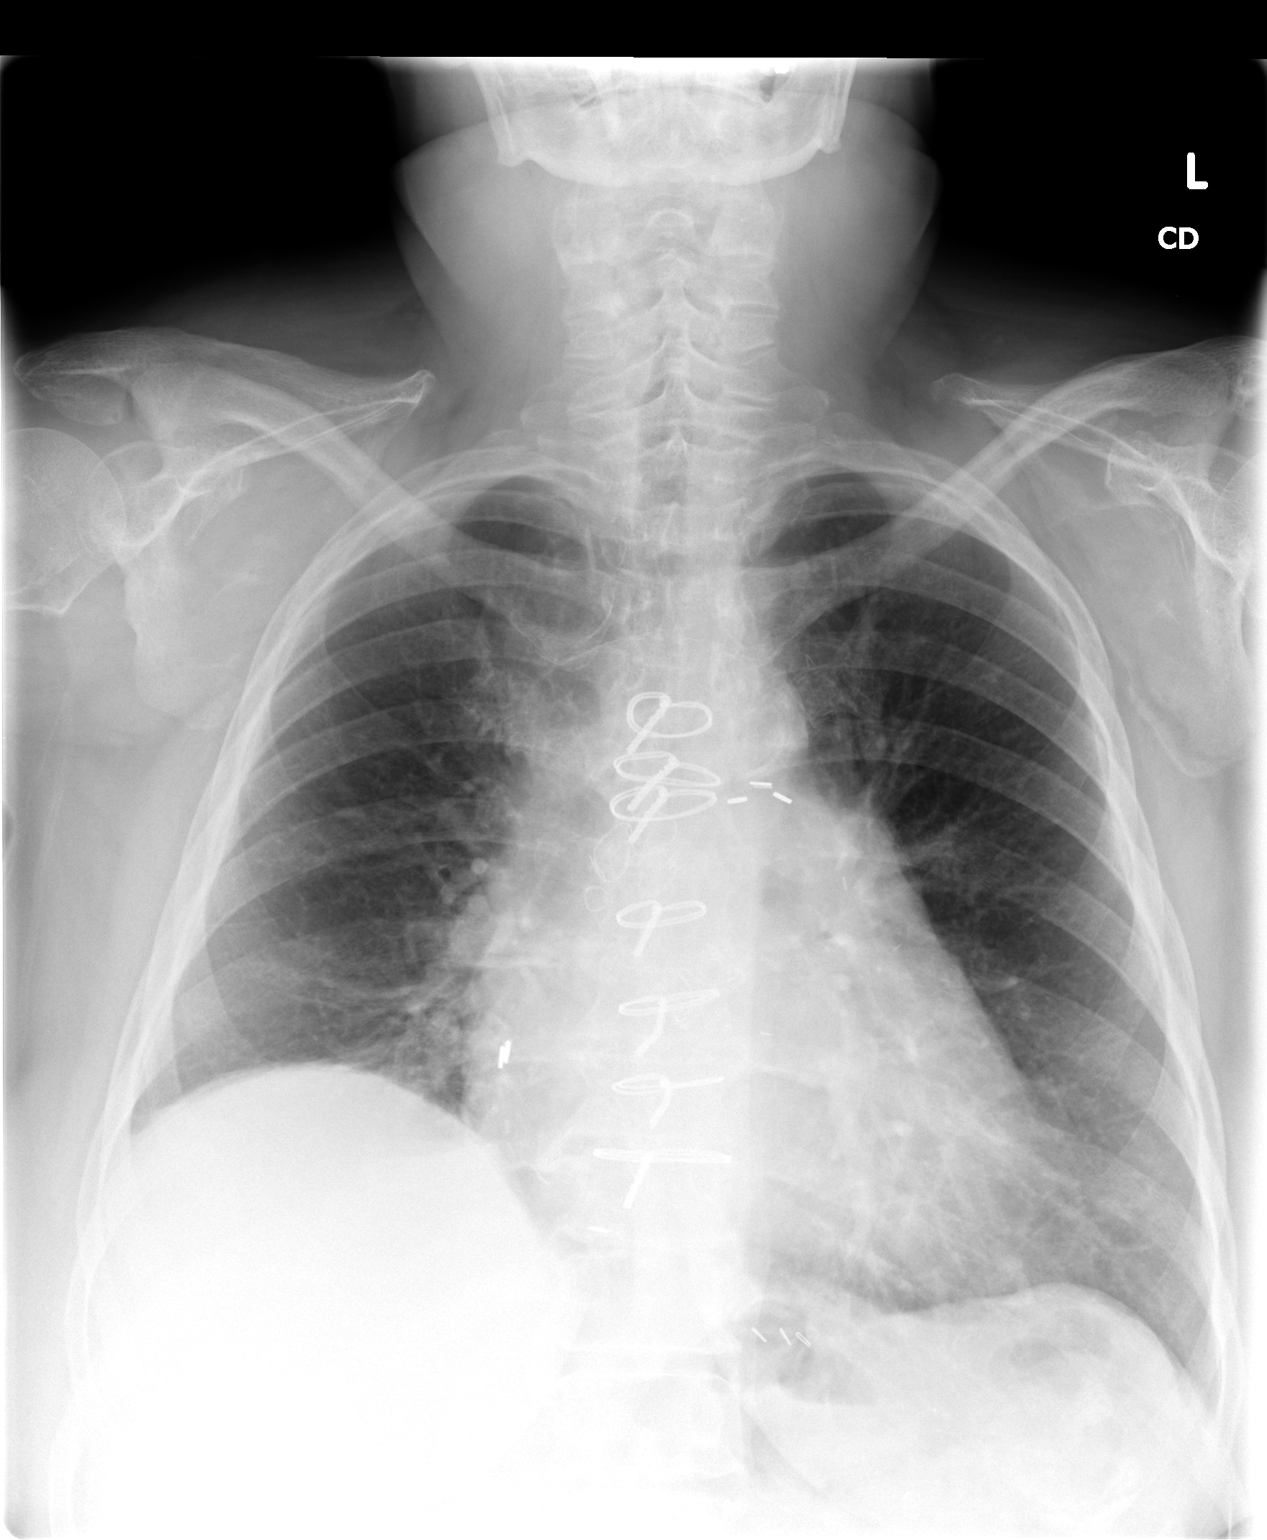

[view not recorded (2 of 2)]
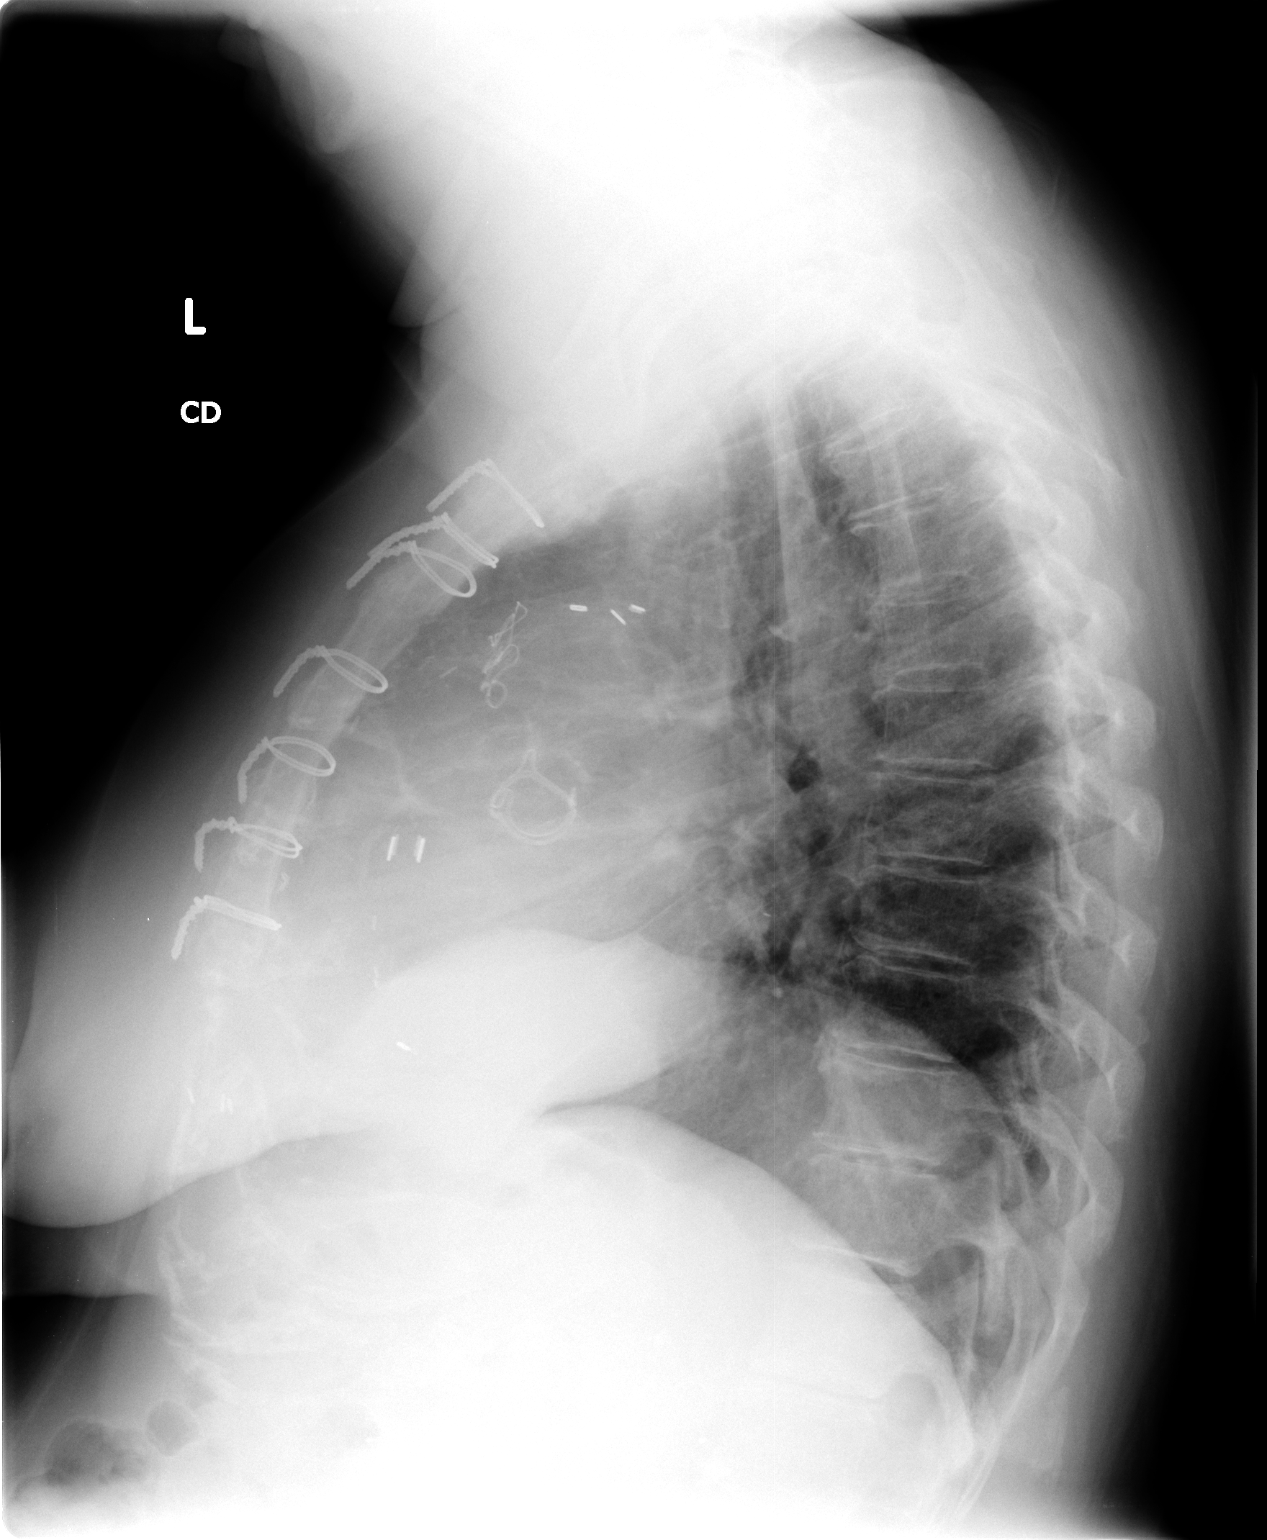

[2 of 2 positions shown; findings below may reference images not displayed]

FINDINGS: The cardiopericardial silhouette is mildly enlarged.  The
right hemidiaphragm remains elevated.  A small right pleural
effusion previously seen is near completely resolved.  There is no
evidence for edema.  The patients is status post aortic valve
replacement.  There is evidence for previous CABG.
IMPRESSION: 1.  Stable elevation of the right hemidiaphragm with near complete
resolution of the right pleural effusion.
2.  Cardiomegaly without failure.

## 2010-08-29 ENCOUNTER — Telehealth: Payer: Self-pay | Admitting: *Deleted

## 2010-08-29 NOTE — Telephone Encounter (Signed)
Returned patient's call regarding samples.  Patient will pick up samples of Bystolic 5 mg and Multaq [id number] mg on 08/30/10.

## 2010-08-29 NOTE — Progress Notes (Signed)
Summary: pt would like samples of symbicort  Phone Note Call from Patient   Caller: Patient Call For: BYRUM Summary of Call: patient phoned and wanted to know if Dr Lamonte Sakai would give her some samples of Symbicort 160. She can be reached at [phone] Initial call taken by: Ozella Rocks,  August 21, 2010 11:16 AM  Follow-up for Phone Call        1 box of symbicort 160 left up front and pt made aware.  Follow-up by: Tilden Dome,  August 21, 2010 11:22 AM

## 2010-09-20 LAB — GLUCOSE, CAPILLARY
Glucose-Capillary: 136 mg/dL — ABNORMAL HIGH (ref 70–99)
Glucose-Capillary: 307 mg/dL — ABNORMAL HIGH (ref 70–99)

## 2010-09-25 ENCOUNTER — Other Ambulatory Visit: Payer: Self-pay | Admitting: Cardiology

## 2010-09-25 DIAGNOSIS — F32A Depression, unspecified: Secondary | ICD-10-CM

## 2010-09-25 DIAGNOSIS — F329 Major depressive disorder, single episode, unspecified: Secondary | ICD-10-CM

## 2010-09-25 NOTE — Telephone Encounter (Signed)
Needs a refill of bystolic, multaq. Please call when ready. I have pulled the chart.

## 2010-09-26 ENCOUNTER — Other Ambulatory Visit: Payer: Self-pay | Admitting: *Deleted

## 2010-09-26 ENCOUNTER — Telehealth: Payer: Self-pay | Admitting: Emergency Medicine

## 2010-09-26 DIAGNOSIS — I10 Essential (primary) hypertension: Secondary | ICD-10-CM

## 2010-09-26 DIAGNOSIS — I4891 Unspecified atrial fibrillation: Secondary | ICD-10-CM

## 2010-09-26 MED ORDER — NEBIVOLOL HCL 5 MG PO TABS: 5.0000 mg | ORAL_TABLET | Freq: Every day | ORAL | Status: DC

## 2010-09-26 MED ORDER — DRONEDARONE HCL 400 MG PO TABS: 400.0000 mg | ORAL_TABLET | Freq: Two times a day (BID) | ORAL | Status: DC

## 2010-09-26 NOTE — Telephone Encounter (Signed)
Pt aware we have no Symbicort samples in the office. She says she will fill this at the pharmacy and has refills available.

## 2010-10-03 ENCOUNTER — Telehealth: Payer: Self-pay | Admitting: Emergency Medicine

## 2010-10-03 NOTE — Telephone Encounter (Signed)
Pt c/o increased cough, wheezing, some SOB x couple days. Pt wants to be seen today but i advised Dr. Lamonte Sakai was full but she could see MW. Dr. Lamonte Sakai had an opening tomorrow and pt states she could wait until then to be seen b/c she would be in town then. Pt coming in tomorrow at 2:30 to see RB

## 2010-10-04 ENCOUNTER — Encounter: Payer: Self-pay | Admitting: Emergency Medicine

## 2010-10-04 ENCOUNTER — Ambulatory Visit (INDEPENDENT_AMBULATORY_CARE_PROVIDER_SITE_OTHER): Payer: Medicare Other | Admitting: Emergency Medicine

## 2010-10-04 DIAGNOSIS — J45909 Unspecified asthma, uncomplicated: Secondary | ICD-10-CM

## 2010-10-04 DIAGNOSIS — J309 Allergic rhinitis, unspecified: Secondary | ICD-10-CM

## 2010-10-04 MED ORDER — PREDNISONE 50 MG PO TABS: 50.0000 mg | ORAL_TABLET | Freq: Every day | ORAL | Status: AC

## 2010-10-04 NOTE — Progress Notes (Signed)
  Subjective:    Patient ID: Hannah [name], female    DOB: 1946-03-30, 65 y.o.   MRN: [id number]  HPI 65 yo never smoker, hx of CAD/CABG, aortic valve replacement, A Fib. Also with hx of childhood asthma. She was diagnosed with adult asthma in her 26's in setting wheeze, SOB, cough that wouldn't go away. Was on prn SABA for a while, started on Symbicort early 2009. Had PFT's done in August 2009 and 2010 that showed mixed disease. In 10/10 FEV1 1.01L. Averages Prednisone about 3 - 4x a year. Never hospitalized or on MV due to breathing. Amiodarone stopped late 2010.   ROV 10/14/09 -- returns for f/u of her asthma, restrictive disease. Tells me that she has had PND, allergy symptoms this spring, making her mor SOB and giving her more cough.   ROV 03/07/10 -- Hx CAD/CABG, asthma and allergies. Still with some nasal congestion, doing loratadine and fluticasone spray, rarely [place]. Does some throat clearing. Taking her symbicort reliably. Uses ventolin a couple times a month.   ROV 10/04/10 -- Hx CAD/CABG, asthma and allergies. Has been having more trouble with nasal gtt and allergies and therefore more cough chest tightness. Using Symbicort. Has needed ventolin about 4x a day for last week. More SOB. Can't tolerate the NSWs.    Review of Systems as per HPI    Objective:   Physical Exam Gen: Pleasant, obese, in no distress,  normal affect  ENT: No lesions,  mouth clear,  oropharynx clear, no postnasal drip  Neck: No JVD, no TMG, no carotid bruits  Lungs: No use of accessory muscles, no dullness to percussion, clear without rales or rhonchi  Cardiovascular: RRR, heart sounds normal, no murmur or gallops, no peripheral edema  Musculoskeletal: No deformities, no cyanosis or clubbing  Neuro: alert, non focal  Skin: Warm, no lesions or rashes         Assessment & Plan:

## 2010-10-04 NOTE — Assessment & Plan Note (Signed)
Quick burst pred then d/c Continue same symbicort

## 2010-10-04 NOTE — Assessment & Plan Note (Signed)
Fluticasone spray loratatine

## 2010-10-04 NOTE — Patient Instructions (Signed)
Take prednisone 50mg  daily for the next 5 days Continue your Symbicort, loratadine and fluticasone spray Follow with Dr Lamonte Sakai in 1 month. Call our office if you aren't improving

## 2010-10-05 ENCOUNTER — Other Ambulatory Visit: Payer: Self-pay | Admitting: Neurosurgery

## 2010-10-05 DIAGNOSIS — M549 Dorsalgia, unspecified: Secondary | ICD-10-CM

## 2010-10-07 ENCOUNTER — Other Ambulatory Visit: Payer: Medicare Other

## 2010-10-13 ENCOUNTER — Ambulatory Visit
Admission: RE | Admit: 2010-10-13 | Discharge: 2010-10-13 | Disposition: A | Discharge status: Home / Self Care | Payer: Medicare Other | Source: Ambulatory Visit | Attending: Neurosurgery | Admitting: Neurosurgery

## 2010-10-13 DIAGNOSIS — M549 Dorsalgia, unspecified: Secondary | ICD-10-CM

## 2010-10-24 ENCOUNTER — Other Ambulatory Visit: Payer: Self-pay | Admitting: Emergency Medicine

## 2010-10-24 NOTE — Assessment & Plan Note (Signed)
OFFICE VISIT   LAHOMA, CONNICK  DOB:  1946-05-01                                        May 11, 2008  CHART #:  [id number]   The patient returned today for followup of her right leg wound status  post coronary artery bypass graft surgery and aortic valve replacement.  She is no longer doing wet-to-dry dressings and just keeping a Band-Aid  over it.  She is otherwise feeling well.   PHYSICAL EXAMINATION:  VITAL SIGNS:  Blood pressure today is 141/60 and  pulse is 68 and regular.  Respiratory rate is 18 and unlabored.  Oxygen  saturation is 94% on room air.  GENERAL:  She looks well.  CARDIAC:  Regular rate and rhythm with normal bowel sounds.  There is no  murmur.  LUNGS:  Decreased breath sounds at the bases, but otherwise clear.  There is no wheezing.  Her incision is well healed.  EXTREMITIES:  The left leg incision has almost completely healed.  There  is a small residual open wound measuring about 5 x 8 mm, which is clean  and dry.  Her ecchymosis has always completely resolved and there is  minimal edema in the left leg.   IMPRESSION:  Overall, the patient has continued to progress  postoperatively.  Her left leg wound appears almost completely healed  and no further treatment for this is needed.  I instructed her to keep a  Band-Aid over it until it is completely healed.  I encouraged her to  continue walking as much as possible.  I told her she can return to  driving a car, but should refrain from lifting anything heavier than 10  pounds for a total of 3 months from date of surgery.  She will continue  to follow up with Dr. Romeo Apple and will contact me if she  develops any problems with her incisions.   Gilford Raid, M.D.  Electronically Signed   BB/MEDQ  D:  05/11/2008  T:  05/11/2008  Job:  [id number]

## 2010-10-24 NOTE — Discharge Summary (Signed)
NAME:  Hannah [name], Hannah [name] NO.:  [id number]   MEDICAL RECORD NO.:  [id number]          PATIENT TYPE:  INP   LOCATION:  3707                         FACILITY:  [facility]   PHYSICIAN:  Ludwig Lean. Doreatha Lew, M.D.DATE OF BIRTH:  07/13/1945   DATE OF ADMISSION:  01/09/2008  DATE OF DISCHARGE:  01/11/2008                               DISCHARGE SUMMARY   DISCHARGE DIAGNOSES:  1. Paroxysmal atrial fibrillation now loaded with amiodarone.  2. Previous antiarrhythmic therapy with Norpace which is no longer      available.  3. Known atherosclerotic cardiovascular disease with previous coronary      artery bypass grafting.  4. History of aortic stenosis.  5. Iron deficiency anemia.  6. Diabetes.  7. Hypertension.  8. Hyperlipidemia.  9. Obesity.   HISTORY OF PRESENT ILLNESS:  The patient is a very pleasant 65 year old  female who has multiple medical problems.  She has been maintained on  long-term Norpace for control of atrial arrhythmias; however, this is no  longer available and she was subsequently switched over to amiodarone.  She used short-acting Norpace over the past month and has had  breakthrough palpitations.  Otherwise, she is done well without  complaints.   Please see the history and physical for the patient presentation and  profile.   LABORATORY DATA:  Hematocrit 32, white count 5000, and platelets 197.  Chemistries were normal except for glucose of 120.  TSH was normal.  Her  chest x-ray was unremarkable.  Her EKG showed sinus rhythm.   HOSPITAL COURSE:  The patient was admitted.  She was started on  amiodarone 400 mg b.i.d.  Norpace had been discontinued the previous  evening.  She is tolerating this now without problems and today on  January 11, 2008, she is felt to be a satisfactory candidate for  discharge.  Her rhythm has remained stable.  She has had no significant  bradycardia.   DISCHARGE CONDITION:  Stable.   DISCHARGE DIET:  Diabetic.   ACTIVITIES:  To be as tolerated.  She is given the okay to return to  work tomorrow on August 3.   DISCHARGE MEDICINES:  1. Lanoxin discontinued.  2. Amiodarone 200 mg b.i.d.  3. Zocor was discontinued.  4. Pravachol 40 mg a day.  5. Insulin as before.  6. Norvasc 5 a day.  7. Metformin 1000 b.i.d.  8. Lisinopril 40 a day.  9. Atenolol 25 a day.  10.Trilipix 135 a day.  11.Zyrtec daily.  12.Citalopram 20 mg a day and inhalers as before.   We will plan on seeing her in the office in about 10 days.  She will  need to have outpatient PFTs since this was not able to be obtained  during this hospitalization.  She is to call if any problems arise in  the interim.      Doyle Askew, N.P.      Ludwig Lean. Doreatha Lew, M.D.  Electronically Signed    LC/MEDQ  D:  01/11/2008  T:  01/11/2008  Job:  [id number]

## 2010-10-24 NOTE — Op Note (Signed)
Hannah [name], Hannah [name]               ACCOUNT NO.:  [id number]   MEDICAL RECORD NO.:  [id number]          PATIENT TYPE:  INP   LOCATION:  2310                         FACILITY:  [facility]   PHYSICIAN:  Gilford Raid, M.D.     DATE OF BIRTH:  Jul 06, 1945   DATE OF PROCEDURE:  04/05/2008  DATE OF DISCHARGE:                               OPERATIVE REPORT   PREOPERATIVE DIAGNOSES:  1. Severe 3-vessel coronary artery disease, status post coronary      artery bypass graft surgery in 1998.  2. Severe aortic stenosis.   POSTOPERATIVE DIAGNOSES:  1. Severe 3-vessel coronary artery disease, status post coronary      artery bypass graft surgery in 1998.  2. Severe aortic stenosis.   OPERATIVE PROCEDURE:  1. Redo median sternotomy.  2. Extracorporeal circulation.  3. Redo coronary artery bypass graft surgery x2 using saphenous vein      graft to the third obtuse marginal branch and the posterior      descending branch of the right coronary artery.  4. The aortic valve replacement using a 21-mm Edwards Magna-Ease      pericardial valve.   SURGEON:  Gilford Raid, MD   ASSISTANT:  Faylene Million. Dominick, PA   ANESTHESIA:  General endotracheal.   CLINICAL HISTORY:  This patient is a 65 year old woman who has a history  of diabetes, obesity, hypertension, and hyperlipidemia, and underwent  coronary artery bypass graft surgery x5 by Dr. Merleen Nicely in 1998.  At that surgery, she had a sequential left internal mammary graft, the  diagonal LAD, as well as saphenous vein grafts sequentially to 2  marginal branches, and a saphenous vein graft to the distal right  coronary artery.  She underwent catheterization again in May 29, 1998, and was found to have a totally occluded right coronary artery  with severe stenosis in the left circumflex and LAD.  At that time, she  underwent stenting of the ostium of the saphenous vein graft and the  obtuse marginal branch for significant stenosis.  She has  been managed  medically since then and has done well.  She does have a history of  atrial fibrillation and has been maintained on chronic Norpace, which  was converted to amiodarone in July 2009.  She has been maintaining  sinus rhythm and has not required Coumadin.  She now presents with a  several-month history of shortness of breath and fatigue.  An  echocardiogram showed aortic valve area of 0.77 sq. cm with a mean  gradient of 29.  Ejection fraction was 55-60%.  There was mild left  ventricular hypertrophy with normal systolic function but diastolic  dysfunction.  There was severe calcified aortic stenosis with mild  aortic insufficiency.  There was mild mitral regurgitation and tricuspid  regurgitation as well as mild pulmonary hypertension.  She did have an  adenosine Cardiolite scan done on March 11, 2008, that showed anterior  ischemia and poor exercise tolerance.  She subsequently underwent  cardiac catheterization on March 23, 2008.  This showed a PA pressure  of 35/14 with a wedge of  17.  There is a mean aortic valve gradient of  35 mmHg.  Cardiac output was 4 by thermodilution with an index of 1.9.  The Fick cardiac output was 4.7 with a index of 2.3.  Ejection fraction  was about 55%.  The LAD was totally occluded in its midportion.  The  left circumflex was totally occluded after the first marginal branch.  The first marginal branch had about 99% proximal stenosis.  The right  coronary artery was totally occluded at the level of the acute margin.  The saphenous vein graft of the right coronary artery was patent, but  there was some smooth tapering of the proximal portion of the graft.  The saphenous vein graft to the obtuse marginal was patent to the distal  marginal branch, but there was a stent present at the ostium of the  graft and this area was somewhat irregular and hazy.  The left internal  mammary graft to the diagonal LAD was widely patent.  After review of  the  catheterization and examination of the patient, it was felt that  redo coronary artery bypass surgery and aortic valve replacement was the  best treatment.  I felt that I would probably be able to graft the first  and third marginal branches as well as the distal right coronary artery  if necessary.  I discussed the operative procedure with the patient and  her family.  I discussed the possible need to replace the right coronary  vein graft as well as the plan to replace the vein graft to the distal  left circumflex and angiograph of the first marginal branch if possible.  Also discussed aortic valve replacement using a tissue valve.  I  discussed the pros and cons of tissue and mechanical valves of the  patient and she was adamant that she did not want mechanical valve.  I  did discuss with her the possibility that a tissue valve may have  structural deterioration over her lifetime and require further surgery  and she understood and agreed to a tissue valve.  I discussed the  benefits and risks of surgery including but not limited to bleeding,  blood transfusion, infection, stroke, myocardial infarction, graft  failure, and death.  Also, I discussed the possibility of complete heart  block requiring permanent pacemaker.  She understood all this and agreed  to proceed with surgery.   OPERATIVE PROCEDURE:  The patient was taken to the operating room and  placed on table in supine position.  After induction of general  endotracheal anesthesia, a Foley catheter was placed in bladder using  sterile technique.  Transesophageal echocardiogram was performed by  Anesthesiology and read by Dr. Kate Sable.  This showed fairly well-  preserved left ventricular contractility.  The right ventricle appeared  to be contracting well.  There was trivial mitral and tricuspid  regurgitation as well as mild aortic insufficiency and severe aortic  stenosis.   Then, the chest, abdomen, and both lower  extremities were prepped and  draped in usual sterile manner.  The chest was reentered through the  previous median sternotomy incision.  The sternal wires were removed.  The sternum opened using oscillating saw without difficulty.  Bone hooks  were used to retract the sternal edges and the pericardium was separated  from the back to the sternum.  The chest retractor was placed.  Dissection was performed to expose the right atrium and ascending aorta.  The patient had very dense adhesions and  this took a long time to expose  the right atrium and ascending aorta.  The 2 previous vein grafts were  identified and carefully preserved.  At the same time, segment of  greater saphenous vein was harvested from the right leg using endoscopic  vein harvest technique.  This vein was of medium size and good quality.   Then, the patient was heparinized when an adequate ACT was obtained.  The distal ascending aorta was cannulated using a 20-French aortic  cannula for arterial inflow.  Venous outflow was achieved using a 2-  stage venous cannula placed through the right atrium using a pursestring  suture.  An antegrade cardioplegia and vent cannula was inserted in the  aortic root.  The patient was then placed on cardiopulmonary bypass.  A  right superior pulmonary vein vent was placed and advanced into the left  ventricle.  Retrograde cardioplegic cannula was inserted through a  pursestring suture in the right atrium and advanced into the coronary  sinus.   Then, the patient was placed on cardiopulmonary bypass.  With the heart  decompressed, the remainder of the heart could be dissected free from  the pericardium.  This took a considerable amount of time due to density  of adhesions.  Then, the left internal mammary pedicle was identified as  it entered into the pericardium laterally.  It was carefully encircled.  Then, an atraumatic vascular clamps placed across the left internal  mammary pedicle.   The crossclamp was placed and 500 mL of cold blood  antegrade cardioplegia was administered in the aortic root with quick  arrest of the heart.  This was followed by 400 mL of cold blood  retrograde cardioplegia.  Systemic hypothermia 228-degree centigrade and  topical hypothermic iced saline was used.  A temperature probe was  placed in the septum insulating pad in the pericardium.  Additional  doses of retrograde cardioplegia were given about 20-minute intervals to  maintain myocardial temperature around 10 degrees centigrade.   Then with the heart stop, the distal coronaries were identified.  The  first marginal branch was intramyocardial and lying quite high on the  lateral wall.  Actually to the surface of the heart where I could see  it, it was a very small vessel distally and not felt to be graftable  especially using the large-caliber conduit that we had.  This was traced  proximally as far as possible, where it was intramyocardial, but I did  not feel this would be suitable for grafting due to a size and the fact  that there was fairly deep in the muscle and there was very limited  exposure high up on the lateral wall.  The second marginal branch was a  tiny vessel that was not graftable.  The third marginal was visualized  and was a moderate-sized graftable vessel.  The right coronary artery  vein graft was examined.  Most of the vein graft was fairly soft, but  the proximal third had a lot of plaque in it probably corresponded to  the area of diffuse narrowing.  The hood of the vein graft was also  diseased and I felt that this graft should be replaced.   Then, the first distal anastomosis was performed to the third marginal  branch.  The internal diameter of this vessel was about 1.75 mm.  The  conduit used was a segment of greater saphenous vein and the anastomosis  performed in an end-to-side manner using continuous 7-0 Prolene suture.  Flow was noted through the graft and  was excellent.   The second distal anastomosis was performed to the distal right coronary  artery just before the takeoff of the posterior descending branch.  The  internal diameter was about 2 mm.  The conduit used was a second segment  of greater saphenous vein and anastomosis performed in a end-to-side  manner using continuous 7-0 Prolene suture.  Flow was noted through the  graft was excellent.   Then attention was turned to aortic valve replacement.  Unfortunately,  the aortic root was heavily diseased with calcific plaque.  It was  necessary to come up just above the level of sinus jugular junction to  be able to open the aorta safely and I had to use a hockey-stick-shaped  incision in order to avoid calcified plaque.  The native valve was  examined.  There were 3 leaflets that were heavily calcified.  There was  heavy annular calcification.  There was a large amount of calcium in the  walls of the aortic sinuses and a large ridge of calcium that spanned  across the superior aspect of the left aortic sinuses.  Then, the native  valve was excised.  The annulus was decalcified with rongeurs.  Care was  taken to remove all particulate debris and the left ventricle and aortic  root were irrigated with iced saline solution copiously.  The annulus  was sized and a 21-mm Edwards Magna-Ease pericardial valve was chosen.  This had model #3300TFX, serial [id number].  Then, a series of pledgeted 2-  0 Ethibond horizontal mattress sutures were placed around the annulus  with pledgets in a subannular position.  The sutures were placed through  the sewing ring, and the valve lowered into place.  The sutures were  tied sequentially.  The valve seated nicely.  The coronary ostia were  identified and were not obstructed.  Then, the aortic root was irrigated  with iced saline solution again and the aortotomy closed in two layers  using continuous 4-0 Prolene suture with pledgets on each end.  The   aortotomy was then reinforced with BioGlue.   Then, the 2 proximal vein graft anastomoses were performed to the  ascending aorta in the end-to-side manner using continuous 6-0 Prolene  suture.  Then, the left side of the heart was de-aired and the head was  placed in Trendelenburg position.  The clamp was removed from mammary  pedicle.  There was rapid warming of the ventricular septum and internal  spontaenous ventricular fibrillation.  The cross-clamp was removed with  a time of 168 minutes.  The patient was defibrillated into sinus rhythm.  The proximal and distal anastomoses were hemostatic and the lie of the  graft was satisfactory.  Graft markers were placed around the proximal  anastomoses.  Two temporary right ventricular pacing wires were placed  and brought through the skin.  Two temporary right atrial pacing wires  were placed and brought through the skin.   When the patient rewarmed to 37 degrees centigrade, she was weaned from  cardiopulmonary bypass on low-dose dopamine.  Total bypass time was 266  minutes.  Cardiac function appeared good by transesophageal  echocardiogram with good left ventricular contractility.  There was some  right ventricular hypokinesis and elevated CVP initially, and I thought  this may be due to some air, and therefore the patient was placed back  on cardiopulmonary bypass for about 15 minutes to allow the area to pass  and rest.  After this period of time, the patient weaned from  cardiopulmonary bypass without difficulty.  Right ventricular function  appeared improved and CVP decreased to about 13 from previous of 25.  The aortic valve prosthesis was functioning normally by TEE without  evidence of regurgitation or perivalvular leak.  There was mild mitral  regurgitation, which was unchanged.  Left ventricular function appeared  well preserved.  Then protamine was given and the venous and aortic  cannulas were removed without difficulty.   Hemostasis was achieved.  The  patient was given platelets and fresh frozen plasma due to coagulopathy  and this resulted in adequate hemostasis.  Then, 3 chest tubes were  placed with 2 in the posterior pericardium, 1 in the right pleural  space, and 1 in the anterior mediastinum.  The sternum was then closed  with double #6 stainless steel wires.  The fascia was closed with continuous #1 Vicryl suture.  Subcutaneous tissue was closed with  continuous 2-0 Vicryl and the skin with a 3-0 Vicryl subcuticular  closure.  Lower extremity vein harvest site was closed in layers in a  similar manner.  The sponge, needles, and instrument counts were correct  according to the scrub nurse.  Dry sterile dressings were applied over  the incisions around the chest tubes, which were Pleur-Evac suction.  The patient remained hemodynamically stable and was transferred to the  SICU in guarded but stable condition.      Gilford Raid, M.D.  Electronically Signed     BB/MEDQ  D:  04/05/2008  T:  04/06/2008  Job:  [id number]   cc:   Ludwig Lean. Doreatha Lew, M.D.

## 2010-10-24 NOTE — H&P (Signed)
NAME:  Plate, [name] NO.:  [id number]   MEDICAL RECORD NO.:  [id number]           PATIENT TYPE:   LOCATION:                                 FACILITY:   PHYSICIAN:  Ludwig Lean. Doreatha Lew, M.D.DATE OF BIRTH:  1945/11/16   DATE OF ADMISSION:  03/23/2008  DATE OF DISCHARGE:                              HISTORY & PHYSICAL   CHIEF COMPLAINT:  Shortness of breath and significant fatigue.   HISTORY OF PRESENT ILLNESS:  Hannah [name] is a very pleasant 65 year old white  female who has multiple medical problems.  She presents now for left and  right heart catheterization due to progressive aortic stenosis as well  as abnormal Cardiolite study.  She most recently has been switched over  from Norpace to amiodarone.  Norpace was no longer being manufactured  and amiodarone was subsequently initiated.  She has basically tolerated  this fairly well but has had progressive dyspnea on exertion and extreme  fatigue.  She has had some mild lightheadedness and dizziness.  No real  chest pain per se.  She underwent adenosine Cardiolite study.  That  study was performed on March 11, 2008.  This was abnormal showing  anterior ischemia.  She had death and poor exercise tolerance.  A repeat  2-D echocardiogram showed her aortic valve area is now 0.77 cm2 with a  mean gradient of 29.  Her EF is still normal at 55-60%.  She has mild  LVH with normal systolic and diastolic LV function, mild left atrial  enlargement, and calcified severe aortic stenosis with mild aortic  insufficiency, mild mitral regurgitation as well as tricuspid  regurgitation and mild pulmonary hypertension.  In light of these  findings and symptomatology, she is now referred for repeat cardiac  catheterization.   PAST MEDICAL HISTORY:  1. Known ischemic heart disease.  She had previous coronary artery      bypass grafting in 1998 with left internal mammary to the LAD,      saphenous vein graft to the OM2 and 3, and  saphenous vein graft to      the right coronary.  She had catheterization in December 1999.  At      that time, had patency of the left internal mammary graft and right      coronary artery graft.  There was a totally occluded right coronary      artery with severe stenosis in the left circumflex and LAD.  She      subsequently underwent stenting of the ostium of the saphenous vein      graft to the OM.  She has been managed medically since that time.  2. Remote history of cardiomyopathy.  3. History of atrial fibrillation, previously maintained on chronic      Norpace but now on amiodarone since July 2009.  4. Hyperlipidemia.  5. Diabetes.  6. Obesity.  7. Hypertension.  8. Iron deficiency anemia.  She has been maintained on chronic      Aranesp.  9. History of a remote colonoscopy.   ALLERGIES:  None.   CURRENT MEDICATIONS:  1.  Atenolol 25 mg a day.  2. Citalopram 20 mg a day.  3. Aspirin daily.  4. Lisinopril 20 mg a day.  5. Glucophage 1000 mg b.i.d.  6. Lantus insulin 100 units at night.  7. NovoLog sliding scale insulin.  8. Amlodipine 5 mg a day.  9. Xanax p.r.n.  10.Trilipix 135 a day.  11.Amiodarone 200 mg a day.  12.Pravachol 20 mg a day.  13.Lasix 40 mg a day.   FAMILY HISTORY:  Positive for coronary disease.   SOCIAL HISTORY:  She is married.  She lives at home with her husband.  She has no current alcohol or tobacco use.  She does not exercise on a  routine basis.   REVIEW OF SYSTEMS:  Basically as noted above.  She remained short of  breath and has had problems with edema.  She feels like she is just  blowed up.  She notes it has been very hard to breathe.  She has had  some chest tightness and no frank syncope.  She has had very minimal  episodes of dizziness.  Her palpitations have smoothed down very nicely  with the low-dose of amiodarone, however, she has become less and less  active.  She is basically not able to keep up with family members.  She   has had no recent fever, flu, or cough.   PHYSICAL EXAMINATION:  GENERAL:  She is an obese white female.  She is  in no acute distress.  VITAL SIGNS:  Her weight is 228 pounds, blood pressure is 120/66  sitting, 110/66 standing, heart rate 72 and regular, respirations 18,  and she is afebrile.  HEENT:  Unremarkable.  NECK:  Supple.  LUNGS:  Clear.  HEART:  A harsh grade 3/6 outflow murmur.  ABDOMEN:  Obese.  She has soft positive bowel sounds, nontender.  EXTREMITIES:  Full with trace amount of edema bilaterally.  NEUROLOGIC:  No gross focal deficits.   PERTINENT LABORATORY DATA:  Pending.   OVERALL IMPRESSION:  1. Progressive aortic stenosis.  2. Worsening shortness of breath with chest tightness.  3. Abnormal Cardiolite study.  4. Known history of ischemic heart disease with remote bypass surgery.  5. History of atrial fibrillation, now on amiodarone.  6. Obesity.  7. Diabetes.  8. Hypertension.  9. Chronic anemia.   PLAN:  We will proceed on with left and right heart catheterization.  The procedure has been reviewed in full detail with both her and her  husband including the risks and benefits and they are willing to proceed  on Tuesday, March 23, 2008.      Doyle Askew, N.P.      Ludwig Lean. Doreatha Lew, M.D.  Electronically Signed    LC/MEDQ  D:  03/16/2008  T:  03/16/2008  Job:  [id number]

## 2010-10-24 NOTE — Discharge Summary (Signed)
Hannah [name], Hannah [name]               ACCOUNT NO.:  [id number]   MEDICAL RECORD NO.:  [id number]          PATIENT TYPE:  INP   LOCATION:  2004                         FACILITY:  [facility]   PHYSICIAN:  Gilford Raid, M.D.     DATE OF BIRTH:  Feb 22, 1946   DATE OF ADMISSION:  04/05/2008  DATE OF DISCHARGE:  04/17/2008                               DISCHARGE SUMMARY   HISTORY:  The patient is a 65 year old female, referred to Dr. Cyndia Bent  with severe aortic stenosis and history of three-vessel coronary artery  disease as per previous coronary artery bypass grafting in 1998.  She  had a catheterization done in December 1999, and was found to have been  totally occluded right coronary artery with severe stenosis of the left  circumflex and LAD, and at that time underwent stenting of the ostium of  the saphenous vein graft to the obtuse marginal branch for significant  stenosis.  She has been managed medically since that time and done  relatively well.  She did have a history of atrial fibrillation and has  been maintained on chronic Norpace, and was converted to amiodarone in  July 2009.  She has since been maintaining normal sinus rhythm and not  been on Coumadin.  She presents now with a several-month history of  shortness of breath and fatigue.  Over the past 3-4 months, the symptoms  have become progressive.  An echocardiogram was obtained, which revealed  an aortic valve area of 0.77 cm2 with a mean gradient of 29.  Ejection  fraction was 55-60%.  There was mild left ventricular hypertrophy and  normal systolic function, but diastolic dysfunction.  There was severe  aortic calcific stenosis with mild aortic insufficiency.  There was mild  mitral regurgitation and tricuspid regurgitation as well as mild  pulmonary hypertension.  She then underwent an adenosine Cardiolite scan  on March 11, 2008 that showed anterior ischemia and poor exercise  tolerance.  She subsequently underwent cardiac  catheterization on  March 23, 2008.  For full details of this, please see the history and  physical done at the time of admission, but it was felt due to the  severity of the recurrent disease that she was a candidate for surgical  revascularization redo as well as aortic valve replacement.  She was  admitted this hospitalization for the procedure.   PAST MEDICAL HISTORY:  1. Coronary artery disease as described above.  2. Aortic stenosis as described above.  3. She also has adult-onset diabetes since age 43.  3. History of hypertension.  5. History of hyperlipidemia.   PAST SURGERIES:  Carpal tunnel surgery in the past.   ALLERGIES:  AMOXICILLIN and TETRACYCLINE.   MEDICATIONS PRIOR TO ADMISSION:  1. Lisinopril 40 mg daily.  2. Atenolol 25 mg daily.  3. Amlodipine 5 mg daily.  4. Trilipix 135 mg daily.  5. Amiodarone 200 mg daily.  6. Pravastatin 40 mg daily.  7. Metformin 200 mg daily.  8. Citalopram 20 mg daily.  9. Alprazolam 0.25 mg q.6 h. p.r.n.  10.Lasix 40 mg daily p.r.n.  11.Aspirin  325 mg daily.  12.Symbicort 2 puffs b.i.d.  13.ProAir 2 puffs q.4 h. p.r.n.  14.Lantus insulin 70 units every night.  15.NovoLog sliding scale.   FAMILY HISTORY/SOCIAL HISTORY/REVIEW OF SYMPTOMS/PHYSICAL EXAMINATION:  Please see the history and physical done at the time of admission.   HOSPITAL COURSE:  The patient was admitted electively on April 05, 2008, and taken to the operating room where she underwent a redo  coronary artery bypass grafting x2 with a saphenous vein graft to the  posterior descending and a saphenous vein graft to the obtuse marginal  #3.  Additionally, she underwent aortic valve replacement with a 21-mm  pericardial tissue valve.  Findings intraoperatively showed that the  obtuse marginal was too small to graft or it could be visualized.  She  is also noted have severe dense adhesions and severe calcific aortic  atherosclerosis.  She was taken to the  Surgical Intensive Care Unit in  stable condition.   POSTOPERATIVE HOSPITAL COURSE:  The patient has progressed nicely over  time.  She did have moderate acute-on-chronic renal insufficiency.  Her  creatinine did stabilize with time.  Her most recent value on April 17, 2008, was 36 and 1.57 respectively.  She had a postoperative anemia  that did require transfusion.  She had a postoperative thrombocytopenia  that showed a positive HIT screen.  Her diabetes required aggressive  management, but her sugars stabilized with time.  She required  aggressive diuresis, but responded well over time.  Her left thigh  incision had moderate bloody drainage with time that improved.  Her  oxygen was weaned and she maintained good saturations on room air.  She  was maintained on amiodarone, and had no significant postoperative  cardiac dysrhythmias.  She responded well in regard to her advancement  activity using standard protocols.  Her overall status was deemed to be  acceptable for discharge on April 17, 2008.   MEDICATIONS ON DISCHARGE:  1. Symbicort 2 puffs twice daily.  2. Amiodarone 200 mg daily.  3. Pravastatin 40 mg daily.  4. Citalopram 20 mg daily.  5. Aspirin 325 mg daily.  6. NovoLog sliding scale as prior to admission.  7. Lopressor 25 mg twice daily.  8. Oxycodone 5 mg 1-2 every 6 hours as needed for pain.  9. Lantus insulin 80 units nightly.  10.Alprazolam 0.25 mg q.4-6 h. p.r.n.  11.ProAir 2 puffs every 4 hours.   INSTRUCTIONS:  The patient received written instructions in regard to  medications, activity, diet, wound care, and followup.   Follow up will include Dr. Doreatha Lew 2 weeks' post discharge.  Dr. Cyndia Bent  on May 04, 2008, for wound check.   FINAL DIAGNOSES:  1. Severe recurrent coronary artery disease.  2. Severe aortic stenosis.  3. Acute renal insufficiency.  4. Chronic renal insufficiency.  5. Postoperative anemia.  6. Postoperative heparin-induced  thrombocytopenia.  7. Diabetes mellitus type 2.  8. History of atrial fibrillation.      John Giovanni, P.A.-C.      Gilford Raid, M.D.  Electronically Signed    WEG/MEDQ  D:  07/20/2008  T:  07/21/2008  Job:  [id number]   cc:   Ludwig Lean. Doreatha Lew, M.D.

## 2010-10-24 NOTE — H&P (Signed)
NAME:  Hannah [name], Hannah [name] NO.:  [id number]   MEDICAL RECORD NO.:  [id number]         PATIENT TYPE:  CINP   LOCATION:                               FACILITY:  [facility]   PHYSICIAN:  Ludwig Lean. Doreatha Lew, M.D.DATE OF BIRTH:  Apr 22, 1946   DATE OF ADMISSION:  01/09/2008  DATE OF DISCHARGE:                              HISTORY & PHYSICAL   CHIEF COMPLAINT:  None.   HISTORY OF PRESENT ILLNESS:  The patient is a very pleasant 65 year old  white female.  She has multiple medical problems.  She has been  maintained on long-term therapy with Norpace for control of arrhythmia.  Unfortunately, her medicine is no longer available and she will need to  be switched over to a different antiarrhythmic therapy.  She has used  short-acting medicine over the past month and has had breakthrough and  complaints of palpitation.  She is now admitted for initiation of  amiodarone.  Clinically, she has otherwise done well with no complaints.   PAST MEDICAL HISTORY:  1. Known atherosclerotic cardiovascular disease.  She has had previous      coronary artery bypass grafting in 1998 with LIMA to the LAD,      diagonal, saphenous vein graft to OM-2 and 3, saphenous vein graft      to the right coronary.  She underwent cardiac catheterization in      December 1999, and at that time demonstrated patency of the left      internal mammary graft and the right coronary artery graft.  There      is a totally occluded right coronary artery with severe stenosis in      the left circumflex and LAD.  She had stenting of the ostium of the      saphenous vein graft to the obtuse marginal and was noted to have      normal LV function and mild aortic stenosis.  Her last stress study      was in February 2008.  2. History of cardiomyopathy.  She has had associated arrhythmia and      has been maintained on chronic Norpace as well as beta blocker      therapy for many years.  3. Hyperlipidemia.  4. Diabetes.  5.  Obesity.  6. Hypertension.  7. History of anemia related to iron deficiency.  She has been      maintained on Aranesp.  8. Prior history of colonoscopy in 2002.   ALLERGIES:  None.   CURRENT MEDICATIONS:  1. Atenolol 25 mg a day.  2. Citalopram 20 mg a day.  3. Aspirin 325 a day.  4. Lisinopril 20 mg a day.  5. Zocor 80 mg a day.  6. Glucophage 1000 mg b.i.d.  7. Lantus insulin 100 units daily.  8. Sliding scale insulin.  9. Amlodipine 5 mg a day.  10.Xanax p.r.n.  11.Trilipix 135 a day.  12.Digoxin 0.125 daily.   FAMILY HISTORY:  Per the old record.   SOCIAL HISTORY:  She is married.  She continues to work.  She has no  current alcohol or  tobacco.   REVIEW OF SYSTEMS:  Basically as noted above.  She has had no syncope.  No chest pain or shortness of breath.  Unfortunately, she continues to  struggle with weight loss.   On exam, she is a very pleasant obese white female.  She is in no acute  distress.  Her weight 222 pounds, blood pressure 130/76 sitting and  standing, heart rate 68 and regular.  HEENT is unremarkable.  Neck is  supple.  Lungs are clear.  Heart shows a grade 2/6 systolic ejection  murmur.  Abdomen is soft and obese with positive bowel sounds.  Extremities are without edema.  Neurologic shows no gross focal  deficits.   Pertinent labs are pending.   OVERALL IMPRESSION:  1. History of arrhythmia, currently maintained on chronic Norpace      which is no longer available in the long-acting form.  2. Known aortic stenosis.  Her last 2-D echocardiogram was in February      2009, which showed an EF of 55-60% and aortic valve area of 1.1 cm      sq. and a mean gradient of 26.  3. Paroxysmal atrial fibrillation.  4. Known ischemic heart disease with previous bypass surgery in 1998      and subsequent stenting of the saphenous vein graft to the obtuse      margin in 1999 with negative Cardiolite study in 2008.  5. Diabetes, insulin-dependent.  6.  Hyperlipidemia.  7. Obesity.   PLAN:  She will be admitted on Friday, July 31.  Amiodarone will be  started.  Zocor will need to be changed to a lower dose.  PFTs and  thyroid studies will be checked accordingly and her other home medicines  will continue.      Doyle Askew, N.P.       Ludwig Lean. Doreatha Lew, M.D.  Electronically Signed    LC/MEDQ  D:  01/06/2008  T:  01/07/2008  Job:  [id number]

## 2010-10-24 NOTE — Cardiovascular Report (Signed)
NAME:  [place], [place] NO.:  [id number]   MEDICAL RECORD NO.:  [id number]          PATIENT TYPE:  OIB   LOCATION:  1961                         FACILITY:  [facility]   PHYSICIAN:  Ludwig Lean. Doreatha Lew, M.D.DATE OF BIRTH:  [birth year]   DATE OF PROCEDURE:  DATE OF DISCHARGE:  03/23/2008                            CARDIAC CATHETERIZATION   PROCEDURE:  Right and left heart catheterization with saphenous vein  graft angiography x2, angiography of left internal mammary artery,  angiography of the left internal mammary artery, coronary arteriogram,  and left ventricular angiogram.   TYPE AND SITE OF ENTRY:  1. Percutaneous right femoral artery.  2. Percutaneous right femoral vein.   CATHETERS:  A 7-French, Swan-Ganz thermodilution catheter, 5-French  pigtail ventriculographic catheter, 5-French 4-curved Judkins right and  left coronary catheters.   CONTRAST MATERIAL:  Omnipaque.   Medications given prior to procedure Valium 2 mg p.o. and Mucomyst.   Medications given during the procedure, Versed 3 mg IV.   COMMENTS:  The patient tolerated the procedure well.   HEMODYNAMIC DATA:  The Fick cardiac output was 4.7 with a cardiac index  of 2.3.  Thermodilution cardiac output was 4 with the cardiac index of  1.9.  The mean aortic valve gradient was 35 mmHg.  The Fick aortic valve  area was 0.87 with the thermodilution cardiac output of 0.74.  The right  atrial pressure was, A wave 16, V wave 15, mean of 12, RV pressure was  38/8-14.  Pulmonary artery pressure was 35/14.  Pulmonary capillary  wedge pressure showed an A wave of 19, V wave of 24 with a mean of 17.  Aortic pressure was 149/60, LV was 166/16.  The pullback LV pressure was  177/16 with aortic pressure of 151/59.  Mean aortic valve gradient was  35 mmHg.   Coronary arteries:  Left ventricular angiogram was performed in the RAO  position.  Overall cardiac size and silhouette were normal.  The global  ejection  fraction would be 55%.   1. Left main coronary artery is normal.  2. Left anterior descending is totally occluded in the midportion.      There are distal collaterals to the diagonal and the LAD.  3. Left circumflex is totally occluded after the first obtuse      marginal.  The first obtuse marginal had a 99% stenosis proximally.      This would be a new finding from the remote catheterization in      1999.  4. Right coronary artery is totally occluded at the level of acute      margin.  It has a high anterior takeoff.   Saphenous vein graft to the right coronary artery is patent.   Saphenous vein graft to the obtuse marginal is patent to the distal OM  which could be the third OM.  There is a very little flow to a second  obtuse marginal branch which was reported to be bypassed.   The left internal mammary artery to the LAD and diagonal was widely  patent with nice insertion and excellent flow.  OVERALL IMPRESSION:  1. Severe aortic stenosis.  2. Severe stenosis in the first obtuse marginal.  3. Totally occluded right coronary artery, totally occluded left      circumflex, totally occluded left anterior descending with a patent      saphenous vein graft to the distal right coronary artery, patent      saphenous vein graft to the distal obtuse marginal and patent left      internal mammary artery graft to the diagonal and left anterior      descending distally.  4. Normal left ventricular function.  5. Mild pulmonary hypertension.   DISCUSSION:  Ms. Cresswell' symptoms are somewhat hard to define.  She does  have a history of atrial fibrillation in the past, as well as a remote  history of cardiomyopathy but now has normal systolic function.  She has  other complicating factors including hyperlipidemia, diabetes, obesity,  hypertension, and chronic anemia treated with Aranesp.  My initial  impression would be that she would benefit from aortic valve replacement  with bypass  grafting to the obtuse marginal, as well as a maze  procedure.  We will have other consultation to try and ascertain the  potential benefit of a stent to her circumflex artery as a lesser  procedure but overall felt that probably given the nature of her other  complex illnesses that now it might be the appropriate time for  correction of her aortic stenosis.      Ludwig Lean. Doreatha Lew, M.D.  Electronically Signed     SNT/MEDQ  D:  03/23/2008  T:  03/24/2008  Job:  [id number]

## 2010-10-24 NOTE — Assessment & Plan Note (Signed)
OFFICE VISIT   Hannah [name], Hannah [name]  DOB:  09/13/1945                                        April 27, 2008  CHART #:  [id number]   The patient returns today for followup, status post redo coronary artery  bypass x2 and aortic valve replacement with an [facility] pericardial  valve on 04/05/2008.  She had a slow postoperative course complicated by  fluid retention and some renal insufficiency.  This improved and she  gradually diuresed.  She also had problems with subcutaneous hematoma  within the left leg saphenous vein harvest site and within the tunnel in  the left thigh and lower leg.  This compromised her ambulation, but  eventually the left leg wound opened up and drained a large amount of  old clot and this significantly improved her pain and ambulation.  She  was sent home with wet-to-dry dressing changes to the open wound.  Since  discharge, said she has continued to improve.  She said she still has  some shortness of breath with ambulation.  Her left leg feels much  better and her swelling has continued to resolve.   PHYSICAL EXAMINATION:  VITAL SIGNS:  Today, her blood pressure is 141/66  and pulse is 72 and regular.  Respiratory rate is 18 and unlabored.  Oxygen saturation on room air is 98%.  GENERAL:  She looks better than at the time of discharge.  CARDIAC:  Regular rate and rhythm with normal bowel sounds.  LUNGS:  Clear.  The chest incision is healing well and the sternum is  stable.  EXTREMITIES:  Her left leg wound is healing well.  It is superficial and  granulating.  There is no sign of infection and no erythema.  The  subcutaneous hematoma in the left leg has significantly improved.  There  is mild residual lower leg edema.   Followup chest x-ray shows minimal bibasilar atelectasis.   MEDICATIONS:  Atenolol 25 mg b.i.d., amiodarone 200 mg daily,  pravastatin 40 mg daily, citalopram 20 mg daily, aspirin 325 mg daily,  NovoLog  insulin sliding scale, and Lantus 70 units nightly.   IMPRESSION:  Overall, the patient is making a good recovery following  her surgery.  She has lost significant weight due to decreased appetite  postoperatively.  She said that her appetite is starting to improve.  I  would expect her left leg wound to heal in over the next few weeks.  She  will continue daily wet-to-dry dressing changes at home.  I encouraged  her to continue walking as much as possible and I would think that she  will be ready to start cardiac rehabilitation in few weeks.  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  She has a followup appointment with Dr.  Doreatha Lew this Friday and I will plan to see her back in about 2 weeks to  follow up on her leg wound.   Gilford Raid, M.D.  Electronically Signed   BB/MEDQ  D:  04/27/2008  T:  04/27/2008  Job:  [id number]   cc:   Ludwig Lean. Doreatha Lew, M.D.

## 2010-10-24 NOTE — Consult Note (Signed)
NEW PATIENT CONSULTATION   Hannah [name], WARWICK  DOB:  Nov 28, 1945                                        March 30, 2008  CHART #:  [id number]   REASON FOR CONSULTATION:  Severe aortic stenosis with history of severe  three-vessel coronary artery disease, status post coronary artery bypass  surgery in 1998.   CLINICAL HISTORY:  I was asked by Dr. Doreatha Lew to evaluate the patient  for consideration of aortic valve replacement and redo coronary artery  bypass surgery.  She is a 65 year old woman, who underwent coronary  artery bypass surgery x5 by Dr. Merleen Nicely in 1998.  She had a left  internal mammary graft to the diagonal and LAD, and saphenous vein graft  sequentially to the first and second marginal branches.  She also had a  vein graft to the right coronary artery.  She had a catheterization  again in December 1999 and was found to have a totally occluded right  coronary artery with severe stenosis in the left circumflex and LAD, and  at that time underwent stenting of the ostium of the saphenous vein  graft to the obtuse marginal branch for significant stenosis.  She has  been managed medically since then and has done relatively well.  She did  have a history of atrial fibrillation and had been maintained on chronic  Norpace, which was converted to amiodarone since July 2009.  She has  been maintaining sinus rhythm and has not been on Coumadin.  She now  presents with a several-month history of shortness of breath and  fatigue.  She said that 6 months ago, she was in her usual state of  health, but over the past 3 or 4 months, has noted progressive symptoms.  She has undergone an echocardiogram showing aortic valve area was 0.77  cm2 with a mean gradient 29.  Ejection fraction was 55%-60%.  There was  mild left ventricular hypertrophy with normal systolic function, but  diastolic dysfunction.  There was calcified severe aortic stenosis with  mild AI.   There was mild MR and TR as well as mild pulmonary  hypertension.  She had a adenosine Cardiolite scan done on March 11, 2008, that showed anterior ischemia and poor exercise tolerance.  She  subsequently underwent cardiac catheterization on March 23, 2008.  This showed a PA pressure of 35/14 with a wedge of 17 mean.  There was a  mean aortic valve gradient of 35 mmHg.  Cardiac output by thermodilution  was 4 with index of 1.9.  The Fick cardiac output was 4.7 with a cardiac  index of 2.3.  Ejection fraction about 55%.  The LAD was totally  occluded in its midportion.  The left circumflex was totally occluded  after the first marginal branch.  The first marginal had about 99%  proximal stenosis.  The right coronary artery was totally occluded at  the level of acute margin.  The saphenous vein graft to the right  coronary was patent.  There was a smooth tapering of the proximal  portion of the graft.  The saphenous vein graft to obtuse marginal was  patent to the distal marginal branch.  The stent was present in the  ostium of the graft and this area was somewhat irregular.  The left  internal mammary graft to the  LAD and diagonal was widely patent.   REVIEW OF SYSTEMS:  As follows.  General:  She denies any fever or  chills.  She has had significant weight gain, which she relates being  fluid.  She has had fatigue.  Appetite has been good.  Eyes:  Negative.  ENT:  Negative.  Endocrine:  She has adult onset diabetes.  She denies  hypothyroidism.  Cardiovascular:  She denies anginal type chest pain,  but has had some chest pressure.  She has shortness of breath with  exertion.  She has had some palpitations and peripheral edema.  Respiratory:  She denies cough and sputum production.  She does have  some wheezing.  She has history of asthma.  GI:  She denies nausea or  vomiting.  She denies melena and bright red blood per rectum.  GU:  She  denies dysuria and hematuria.  Musculoskeletal:   She denies arthralgias  and myalgias.  Vascular:  She denies claudication and phlebitis.  Neurological:  She denies any focal weakness or numbness.  She has had  some dizziness with position changes.  She has never had a TIA or  stroke.  Psychiatric:  Negative.  Hematological:  She does have a  history of anemia.   ALLERGIES:  Amoxicillin and tetracycline.   MEDICATIONS:  Lisinopril 40 mg daily, atenolol 25 mg daily, amlodipine 5  mg daily, Trilipix 135 mg daily, amiodarone 200 mg daily, pravastatin 40  mg daily, metformin 2000 mg daily, citalopram 20 mg daily, alprazolam  0.25 mg q.6 h. p.r.n., Lasix 40 mg daily p.r.n., aspirin 325 mg daily,  Symbicort 2 puffs b.i.d., ProAir 2 puffs q.4 h. p.r.n., Lantus 70 units  nightly, and NovoLog sliding scale.   PAST MEDICAL HISTORY:  Significant for coronary artery bypass surgery as  mentioned above.  She has a history of aortic stenosis as mentioned  above.  She has a history of adult-onset diabetes at age 65.  She has  history of hypertension, hyperlipidemia.  Previous surgery includes  carpal tunnel surgery in the past.   FAMILY HISTORY:  Positive for cardiac disease in one brother and her  father.   SOCIAL HISTORY:  She is married and works for an [employer].  She has never smoked.  Denies alcohol abuse.   PHYSICAL EXAMINATION:  VITAL SIGNS:  Blood pressure is 133/73.  Her  pulse is 71 and regular.  Respiratory rate is 18, unlabored.  Oxygen  saturation on room air is 97%.  GENERAL:  She is a obese white female in no distress.  HEENT:  Normocephalic and atraumatic.  Pupils are equal and reactive to  light and accommodation.  Extraocular muscles are intact.  Throat is  clear.  Teeth are in good condition.  NECK:  Normal carotid pulses bilaterally.  There is a transmitted murmur  to both sides of her neck.  There is no adenopathy or thyromegaly.  CARDIAC:  Regular rate and rhythm with a harsh 3/6 systolic murmur of  aortic  stenosis.  LUNGS:  Clear.  ABDOMEN:  Active bowel sounds.  Abdomen is soft, obese, and nontender.  There are no palpable masses or organomegaly.  EXTREMITIES:  Mild bilateral ankle edema.  SKIN:  Normal.  NEUROLOGIC:  Alert and oriented x3.  Motor and sensory exams grossly  normal.   LABORATORY DATA:  Laboratory examination prior to catheterization showed  creatinine of 1.3, BUN of 22, glucose of 192, and potassium of 5.5.  Her  PFTs showed mixed  obstruction of moderate degree and some restriction.  Her diffusion capacity was 75% predicted.  FEV-1 was 1.5 which was 65%  predicted.   IMPRESSION:  The patient has severe aortic stenosis with a history of  severe three-vessel coronary artery disease, status post coronary artery  bypass surgery in 1998.  She does have a high-grade stenosis  compromising the first obtuse marginal vessel, but the grafts were  otherwise patent.  The left circumflex graft has a stent in the ostium  and is not a reliable long-term graft for this reason.  There is some  smooth tapering of the proximal right coronary artery and this area  would likely have to be replaced anyway since the proximal anastomosis  is probably where we would need open the aorta to replace her aortic  valve.  Given her recent progression of symptoms and signs of left  ventricular stress, I would agree that we should proceed with aortic  valve replacement and coronary artery bypass to the first marginal,  second marginal, and possibly the right coronary artery.  I discussed  the operative procedure with her and her husband including the  alternatives for valve prosthesis including mechanical and tissue  valves.  We discussed the pros and cons of both.  She had a brother, who  had aortic valve replacement with a mechanical valve over 20 years ago,  which sounds that it was a Bjork-Shiley valve that suffered acute  structural failure and resulted in death.  She does not want mechanical   valve and would like to have a tissue valve.  I suspect that a  pericardial tissue valve would probably last for the rest of her life  given the fact that she has significant coronary artery disease.  She  does understand the risks that a tissue valve could deteriorate over  time and require replacement for structural deterioration.  I discussed  the benefits and risks of surgery including the possibility that it may  not relieve all her symptoms due to left ventricular diastolic  dysfunction.  We also discussed the risks of surgery including but not  limited to bleeding, blood transfusion, infection, stroke, myocardial  infarction, graft failure, heart block requiring permanent pacemaker,  and death.  She understands all of this  and agrees to proceed with surgery.  She would like to proceed next week  and we will schedule this at the first opportunity.   Gilford Raid, M.D.  Electronically Signed   BB/MEDQ  D:  03/30/2008  T:  03/31/2008  Job:  [id number]   cc:   Ludwig Lean. Doreatha Lew, M.D.

## 2010-10-26 ENCOUNTER — Encounter: Payer: Self-pay | Admitting: Cardiology

## 2010-10-26 DIAGNOSIS — I509 Heart failure, unspecified: Secondary | ICD-10-CM | POA: Insufficient documentation

## 2010-10-26 DIAGNOSIS — I428 Other cardiomyopathies: Secondary | ICD-10-CM | POA: Insufficient documentation

## 2010-10-26 DIAGNOSIS — E119 Type 2 diabetes mellitus without complications: Secondary | ICD-10-CM | POA: Insufficient documentation

## 2010-10-26 DIAGNOSIS — N289 Disorder of kidney and ureter, unspecified: Secondary | ICD-10-CM | POA: Insufficient documentation

## 2010-10-26 DIAGNOSIS — D638 Anemia in other chronic diseases classified elsewhere: Secondary | ICD-10-CM | POA: Insufficient documentation

## 2010-10-26 DIAGNOSIS — E785 Hyperlipidemia, unspecified: Secondary | ICD-10-CM | POA: Insufficient documentation

## 2010-10-26 DIAGNOSIS — R0602 Shortness of breath: Secondary | ICD-10-CM | POA: Insufficient documentation

## 2010-10-26 DIAGNOSIS — I10 Essential (primary) hypertension: Secondary | ICD-10-CM | POA: Insufficient documentation

## 2010-10-26 DIAGNOSIS — R0600 Dyspnea, unspecified: Secondary | ICD-10-CM | POA: Insufficient documentation

## 2010-10-26 DIAGNOSIS — R0609 Other forms of dyspnea: Secondary | ICD-10-CM | POA: Insufficient documentation

## 2010-10-26 DIAGNOSIS — D509 Iron deficiency anemia, unspecified: Secondary | ICD-10-CM | POA: Insufficient documentation

## 2010-10-27 ENCOUNTER — Encounter: Payer: Self-pay | Admitting: Cardiology

## 2010-10-27 ENCOUNTER — Ambulatory Visit (INDEPENDENT_AMBULATORY_CARE_PROVIDER_SITE_OTHER): Payer: Medicare Other | Admitting: Cardiology

## 2010-10-27 DIAGNOSIS — I428 Other cardiomyopathies: Secondary | ICD-10-CM

## 2010-10-27 DIAGNOSIS — I4891 Unspecified atrial fibrillation: Secondary | ICD-10-CM

## 2010-10-27 DIAGNOSIS — Z952 Presence of prosthetic heart valve: Secondary | ICD-10-CM

## 2010-10-27 NOTE — Assessment & Plan Note (Signed)
She has been managed on Multaq 400 mg twice a day. 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 Family of drug toxicity was felt to be an issue with her shortness of breath.

## 2010-10-27 NOTE — Assessment & Plan Note (Signed)
Her initial presentation was that of a nonischemic cardiomyopathy she subsequently had improvement of her ejection fraction. At the time of catheterization in 1989, he had a mild increase in filling pressure and mild cardiac enlargement and a global EF was estimated in the 55% range. She had only minimal irregularities in her coronary artery tree. She subsequently developed progressive coronary disease and had bypass surgery in 1998.

## 2010-10-27 NOTE — Progress Notes (Signed)
Subjective:   Hannah [name] is seen today for followup visit. She currently is doing quite well except for some right leg discomfort which is limiting her ability to walk. She has lower extremity edema has been wearing support stockings with some degree of improvement. She's been off of amiodarone and is on chronic multi-and basically staying in sinus rhythm.  She presented initially with what was felt to be nonischemic cardiomyopathy. She subsequently developed coronary artery disease and had coronary artery bypass grafting in 1998. She then developed progressive coronary disease as well as aortic valve disease and had repeat coronary artery bypass grafting and aortic valve replacement in 2009. She's had mild renal insufficiency. He does have a history of diabetes as well as a history of atrial fibrillation in the past. She is not on warfarin anticoagulation but she has been in sinus rhythm on Multaq. 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 She's had a history of anemia maintained on Aranesp.  Current Outpatient Prescriptions  Medication Sig Dispense Refill  . ACCU-CHEK AVIVA PLUS test strip As directed      . albuterol (PROVENTIL,VENTOLIN) 90 MCG/ACT inhaler Inhale 2 puffs into the lungs every 6 (six) hours as needed.        Marland Kitchen amLODipine (NORVASC) 10 MG tablet Take 1 tablet by mouth Daily.      . budesonide-formoterol (SYMBICORT) 160-4.5 MCG/ACT inhaler Inhale 2 puffs into the lungs 2 (two) times daily.        . citalopram (CELEXA) 20 MG tablet TAKE ONE TABLET BY MOUTH EVERY DAY  30 tablet  6  . dronedarone (MULTAQ) 400 MG tablet Take 1 tablet (400 mg total) by mouth 2 (two) times daily with a meal.  60 tablet  6  . HUMALOG KWIKPEN 100 UNIT/ML injection With meals      . LANTUS SOLOSTAR 100 UNIT/ML injection Inject 60 Units as directed Every morning.      Marland Kitchen LIPITOR 40 MG tablet Take 1 tablet by mouth Daily.      Marland Kitchen lisinopril (PRINIVIL,ZESTRIL) 2.5 MG tablet Take 1 tablet by mouth Daily.      Marland Kitchen  loratadine (CLARITIN) 10 MG tablet TAKE ONE TABLET BY MOUTH EVERY DAY  30 tablet  11  . nebivolol (BYSTOLIC) 5 MG tablet Take 1 tablet (5 mg total) by mouth daily.  30 tablet  6  . traMADol (ULTRAM) 50 MG tablet Take 1 tablet by mouth as needed.        Allergies  Allergen Reactions  . Amoxicillin     REACTION: rash  . Tetracycline     REACTION: unknown    Patient Active Problem List  Diagnoses  . HYPERLIPIDEMIA  . HYPERTENSION  . ATRIAL FIBRILLATION  . ALLERGIC RHINITIS  . ASTHMA  . DYSPNEA  . COUGH  . Hypertension  . Hyperlipidemia  . SOB (shortness of breath)  . CHF (congestive heart failure)  . Renal insufficiency, mild  . Dyspnea  . Nonischemic cardiomyopathy  . Anemia  . Diabetes mellitus  . Iron deficiency anemia    History  Smoking status  . Never Smoker   Smokeless tobacco  . Never Used    History  Alcohol Use No    No family history on file.  Review of Systems:   The patient denies any heat or cold intolerance.  No weight gain or weight loss.  The patient denies headaches or blurry vision.  There is no cough or sputum production.  The patient denies dizziness.  There  is no hematuria or hematochezia.  The patient denies any muscle aches or arthritis.  The patient denies any rash.  The patient denies frequent falling or instability.  There is no history of depression or anxiety.  All other systems were reviewed and are negative.   Physical Exam:   Weight is 225. Blood pressure 114/60 sitting, 120 or 60 standing, heart rate is 68 and regular. Her BMI is 39. His grade 2/6 systolic outflow murmur that radiates to the neck. Lungs are recently clear. Abdomen is soft and nontender. Extremities show 1+ edema with support stockings in place. Neurologically she's intact.Assessment / Plan:

## 2010-10-27 NOTE — Assessment & Plan Note (Signed)
She had aortic valve replacement with a tissue valve. She has an aortic outflow murmur that radiates to the neck and will check carotid Dopplers to make sure she doesn't have any carotid disease. She a 2-D echocardiogram in January 2011 with mean aortic valve gradient of 19 mm and peak gradient of 32 mm mercury.

## 2010-10-27 NOTE — Procedures (Signed)
[place]  Patient:    Hannah [name], Hannah [name]                        MRN: [id number] Proc. Date: 10/21/00 Attending:  Jeneen Rinks L. Oletta Lamas, M.D. CC:         Ludwig Lean. Doreatha Lew, M.D.  Youlanda Roys. Deatra Ina, M.D.   Procedure Report  PROCEDURE PERFORMED:  Colonoscopy.  ENDOSCOPIST:  Joyice Faster. Oletta Lamas, M.D.  MEDICATIONS USED:  Fentanyl 100 mcg, Versed 10 mg IV.  INSTRUMENT:  Adult Olympus video colonoscope.  INDICATIONS:  The patient is a 65 year old woman with iron deficiency anemia.  DESCRIPTION OF PROCEDURE:  The procedure had been explained to the patient and consent obtained.  With the patient in the left lateral decubitus position, the adult Olympus video colonoscope was inserted and advanced under direct visualization.  The prep was very margin.  Some areas were obscured by sticky stool.  We were able to advance to the cecum using abdominal pressure and position changes.  The cecum, ascending colon were all particularly dirty.  A small polyp could have been missed.  There were certainly no gross lesions of any significance.  The hepatic flexure, transverse colon, splenic flexure, descending and sigmoid colon were seen well upon withdrawal.  No polyps or other lesions were seen in this area.  Scope withdrawn, the rectum was also free or polyps.  The patient tolerated the procedure well.  Maintained on low flow oxygen and pulse oximeter throughout the procedure with no obvious problem.  ASSESSMENT:  Iron deficiency anemia with no gross lesions in the colon to explain gastrointestinal blood loss.  PLAN:  Will continue on iron twice daily.  See back in the office in two months and will arrange a repeat hemoglobin in two weeks. DD:  10/21/00 TD:  10/21/00 Job: [id number]

## 2010-10-30 ENCOUNTER — Encounter: Payer: Self-pay | Admitting: Emergency Medicine

## 2010-10-31 ENCOUNTER — Telehealth: Payer: Self-pay | Admitting: Cardiology

## 2010-10-31 NOTE — Telephone Encounter (Signed)
[phone] LAtest OV, EKG, ECHO, CATH

## 2010-11-03 ENCOUNTER — Ambulatory Visit: Payer: Medicare Other | Admitting: Emergency Medicine

## 2010-11-22 ENCOUNTER — Telehealth: Payer: Self-pay | Admitting: Emergency Medicine

## 2010-11-22 MED ORDER — AZELASTINE HCL 0.1 % NA SOLN: 2.0000 | Freq: Three times a day (TID) | NASAL | Status: DC | PRN

## 2010-11-22 MED ORDER — BUDESONIDE-FORMOTEROL FUMARATE 160-4.5 MCG/ACT IN AERO: 2.0000 | INHALATION_SPRAY | Freq: Two times a day (BID) | RESPIRATORY_TRACT | Status: DC

## 2010-11-22 NOTE — Telephone Encounter (Signed)
Called and spoke with pt and informed her of RB's response.  Rx sent to pharmacy.  Pt aware.

## 2010-11-22 NOTE — Telephone Encounter (Signed)
Called and spoke with pt.  Pt requested rx for Symbicort be faxed to [place]- Rx sent. Pt aware.  Also, pt c/o having a lot of clear nasal drainage and PND x several days.  Pt states she was up every hour last night coughing and gagging.  Pt states she is taking her Loratadine and nasal spray with no relief.  Pt also c/o hoarseness.  Pt is requesting RB's recs.  Pt states she does not use to use the neti pot- states this doesn't help her.  Also, pt states she saw her PCP yesterday and was informed her "lungs are clear" so pt believes this mucus is coming from her sinuses/head.  RB, please advise.  Thanks.

## 2010-11-22 NOTE — Telephone Encounter (Signed)
She can try the neti-pot, but if it doesn't help then write script for astelin nasal spray, 2 sprays each side tid prn

## 2010-11-28 ENCOUNTER — Telehealth: Payer: Self-pay | Admitting: Emergency Medicine

## 2010-11-28 MED ORDER — AZITHROMYCIN 250 MG PO TABS: ORAL_TABLET | ORAL | Status: AC

## 2010-11-28 NOTE — Telephone Encounter (Signed)
She may have developed an associated bronchitis. Please order azithromycin for her, 500mg  on day1, 250mg  on day 2 - 5. Thanks.

## 2010-11-28 NOTE — Telephone Encounter (Signed)
Spoke with pt and she states she called on 11-22-10 because she was having PND that was causing her to cough. Pt states at that time the drainage was clear. She has been using the astelin nasal spray prescribed on 11-22-10 and it has helped the PND , but she states she is now having a productive cough with grey/green thick phlegm x 2 days.  She is using claritin, and symbicort as directed. There are no appts available today.  Please advise. [place] Bing, CMA

## 2010-11-28 NOTE — Telephone Encounter (Signed)
LMOM TCB x1.  Need to verify pharmacy.

## 2010-11-28 NOTE — Telephone Encounter (Signed)
Pt request rx be sent to [facility] on [address]. RX sent. Pt aware.[place] Bing, CMA

## 2010-12-07 ENCOUNTER — Telehealth: Payer: Self-pay | Admitting: Cardiology

## 2010-12-07 NOTE — Telephone Encounter (Signed)
Called in wondering if there were any samples of Bystolic and Multaq that she could have. Please call back. I have pulled her chart.

## 2010-12-07 NOTE — Telephone Encounter (Signed)
Samples available for pt to pick up.  Pt notified and will be by later today.

## 2010-12-27 ENCOUNTER — Other Ambulatory Visit: Payer: Self-pay | Admitting: *Deleted

## 2010-12-27 MED ORDER — AMLODIPINE BESYLATE 10 MG PO TABS: 10.0000 mg | ORAL_TABLET | Freq: Every day | ORAL | Status: DC

## 2010-12-27 NOTE — Telephone Encounter (Signed)
escribe medication per fax request  

## 2011-01-04 ENCOUNTER — Telehealth: Payer: Self-pay | Admitting: Cardiology

## 2011-01-04 NOTE — Telephone Encounter (Signed)
Called because her and [name] had talked about going back to the cardiac rehab through the maintenance program before, and she was wondering if that were still possible. She would like to re-join to lose weigh, get fit and get in shape and she doesn't feel like that would happen other wise. Please call back. I have pulled the chart.

## 2011-01-04 NOTE — Telephone Encounter (Signed)
Wanted to know if she could join maintenance program at cardiac rehab. Advised that would be out of pocket. Insurance will not pay for it. Suggested that it miight be cheaper to join a gym. She agreed. Will check into that.

## 2011-01-24 ENCOUNTER — Ambulatory Visit (INDEPENDENT_AMBULATORY_CARE_PROVIDER_SITE_OTHER): Payer: Medicare Other | Admitting: Nurse Practitioner

## 2011-01-24 ENCOUNTER — Encounter: Payer: Self-pay | Admitting: Nurse Practitioner

## 2011-01-24 DIAGNOSIS — I2510 Atherosclerotic heart disease of native coronary artery without angina pectoris: Secondary | ICD-10-CM | POA: Insufficient documentation

## 2011-01-24 DIAGNOSIS — I4891 Unspecified atrial fibrillation: Secondary | ICD-10-CM

## 2011-01-24 DIAGNOSIS — I509 Heart failure, unspecified: Secondary | ICD-10-CM

## 2011-01-24 DIAGNOSIS — N289 Disorder of kidney and ureter, unspecified: Secondary | ICD-10-CM

## 2011-01-24 DIAGNOSIS — Z79899 Other long term (current) drug therapy: Secondary | ICD-10-CM

## 2011-01-24 DIAGNOSIS — R0602 Shortness of breath: Secondary | ICD-10-CM

## 2011-01-24 HISTORY — DX: Atherosclerotic heart disease of native coronary artery without angina pectoris: I25.10

## 2011-01-24 NOTE — Assessment & Plan Note (Signed)
She looks compensated. EF is low normal at last check. Will consider repeating ultrasound on return.

## 2011-01-24 NOTE — Assessment & Plan Note (Addendum)
She has had remote CABG in 1998 followed by redo surgery in 2009 with AVR. She does ok given her limitations with her breathing and poor exercise tolerance/fatigue. Disability papers are filled out today. No current chest pain. She will probably need repeat stress testing on return visit. No change in her medicines.

## 2011-01-24 NOTE — Assessment & Plan Note (Signed)
This is a chronic issue. No real change. She continues to be followed by pulmonary.

## 2011-01-24 NOTE — Progress Notes (Signed)
Agree with note.  Hannah [name]  

## 2011-01-24 NOTE — Assessment & Plan Note (Signed)
She remains in sinus and is on Multaq. 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

## 2011-01-24 NOTE — Patient Instructions (Signed)
Stay on your current medicines I will send in your papers for disability Have Dr. Nolon Rod send [place] a copy of your labs in October I will have you see Dr. Aundra Dubin in about 6 months. We will probably set up another ultrasound at that time. Call for any problems.

## 2011-01-24 NOTE — Progress Notes (Signed)
Hannah [name] Date of Birth: 1945/12/28   History of Present Illness: Hannah [name] is seen today for a follow up visit. She is seen for Dr. Aundra Dubin. She is a former patient of Dr. Susa Simmonds. She has multiple issues which include DM, CAD with redo CABG, AVR, CRI, obesity, HTN, PAF on Multaq and chronic shortness of breath. 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 She has a low normal EF per echo in January of 2011.    She comes in today to have her disability papers filled out. She has chronic shortness of breath and fatigue. She is not able to work. No chest pain currently. She has very poor exercise tolerance. The heat has been hard on her but overall, she seems to be holding her own. She will have some rare palpitations. She is on Multaq. She is no longer on her Aranesp and has not seen nephrology in over one year. She is on low dose ACE. She will be having complete labs with her primary care in October.   Current Outpatient Prescriptions on File Prior to Visit  Medication Sig Dispense Refill  . ACCU-CHEK AVIVA PLUS test strip As directed      . albuterol (PROVENTIL,VENTOLIN) 90 MCG/ACT inhaler Inhale 2 puffs into the lungs every 6 (six) hours as needed.        Marland Kitchen amLODipine (NORVASC) 10 MG tablet Take 1 tablet (10 mg total) by mouth daily.  30 tablet  5  . azelastine (ASTELIN) 137 MCG/SPRAY nasal spray Place 2 sprays into the nose 3 (three) times daily as needed for rhinitis. Use in each nostril as directed  30 mL  1  . budesonide-formoterol (SYMBICORT) 160-4.5 MCG/ACT inhaler Inhale 2 puffs into the lungs 2 (two) times daily.  1 Inhaler  6  . citalopram (CELEXA) 20 MG tablet TAKE ONE TABLET BY MOUTH EVERY DAY  30 tablet  6  . dronedarone (MULTAQ) 400 MG tablet Take 1 tablet (400 mg total) by mouth 2 (two) times daily with a meal.  60 tablet  6  . HUMALOG KWIKPEN 100 UNIT/ML injection With meals      . LANTUS SOLOSTAR 100 UNIT/ML injection Inject 60 Units as directed Every morning.      Marland Kitchen  LIPITOR 40 MG tablet Take 1 tablet by mouth Daily.      Marland Kitchen lisinopril (PRINIVIL,ZESTRIL) 2.5 MG tablet Take 1 tablet by mouth Daily.      Marland Kitchen loratadine (CLARITIN) 10 MG tablet TAKE ONE TABLET BY MOUTH EVERY DAY  30 tablet  11  . nebivolol (BYSTOLIC) 5 MG tablet Take 1 tablet (5 mg total) by mouth daily.  30 tablet  6  . traMADol (ULTRAM) 50 MG tablet Take 1 tablet by mouth as needed.        Allergies  Allergen Reactions  . Amoxicillin     REACTION: rash  . Tetracycline     REACTION: unknown    Past Medical History  Diagnosis Date  . Hypertension   . Hyperlipidemia   . Atrial fibrillation     in sinus on Multaq  . CHF (congestive heart failure)     EF is low normal at 50 to 55% per echo in Jan. 2011  . Renal insufficiency, mild   . Dyspnea     chronic; followed by Dr. Lamonte Sakai  . Nonischemic cardiomyopathy   . Anemia   . Diabetes mellitus   . Iron deficiency anemia   . High risk medication use   .  CAD (coronary artery disease)     Remote CABG in 1998 with redo in 2009  . S/P AVR 2009  . Amiodarone toxicity     Past Surgical History  Procedure Date  . Coronary artery bypass graft 1998 & 2009    Had LIMA to DX/LAD, SVG to 2 marginal branches and SVG to [facility] originally; SVG to 3rd OM and PD at time of redo  . Aortic valve replacement 2009    #55mm [facility] Ease pericardial valve  . Cardiac catheterization 03/23/2008    OVERALL CARDIAC SIZE AND SILHOUETTE WERE NORMAL. EF IS 55%    History  Smoking status  . Never Smoker   Smokeless tobacco  . Never Used    History  Alcohol Use No    Family History  Problem Relation Age of Onset  . Heart disease Mother   . Heart disease Father     Review of Systems: The review of systems is positive for chronic shortness of breath. No chest pain. She is rarely dizzy. No syncope.  She will have some occasional edema if she is up for too long. Blood pressure is better at home. All other systems were reviewed and are  negative.  Physical Exam: BP 128/70  Pulse 92  Ht 5' 4.5" (1.638 m)  Wt 229 lb (103.874 kg)  BMI 38.70 kg/m2 Patient is very pleasant and in no acute distress. She is obese. Skin is warm and dry. Color is normal.  HEENT is unremarkable. Normocephalic/atraumatic. PERRL. Sclera are nonicteric. Neck is supple. No masses. No JVD. Lungs are clear. Cardiac exam shows a regular rate and rhythm. Valve is crisp. Abdomen is obese and soft. Extremities are with trace edema. Gait and ROM are intact. No gross neurologic deficits noted.  LABORATORY DATA: EKG shows sinus rhythm.    Assessment / Plan:

## 2011-01-24 NOTE — Assessment & Plan Note (Signed)
She will have labs with her primary care. I have asked her to send [place] a copy for review. She seems ok at this standpoint.

## 2011-01-28 IMAGING — CR DG CHEST 2V
2 series · 2 of 2 positions shown · non-contrast
Comparison: 12/09/2008

CLINICAL DATA: Asthma

CHEST - 2 VIEW

[view not recorded (1 of 2)]
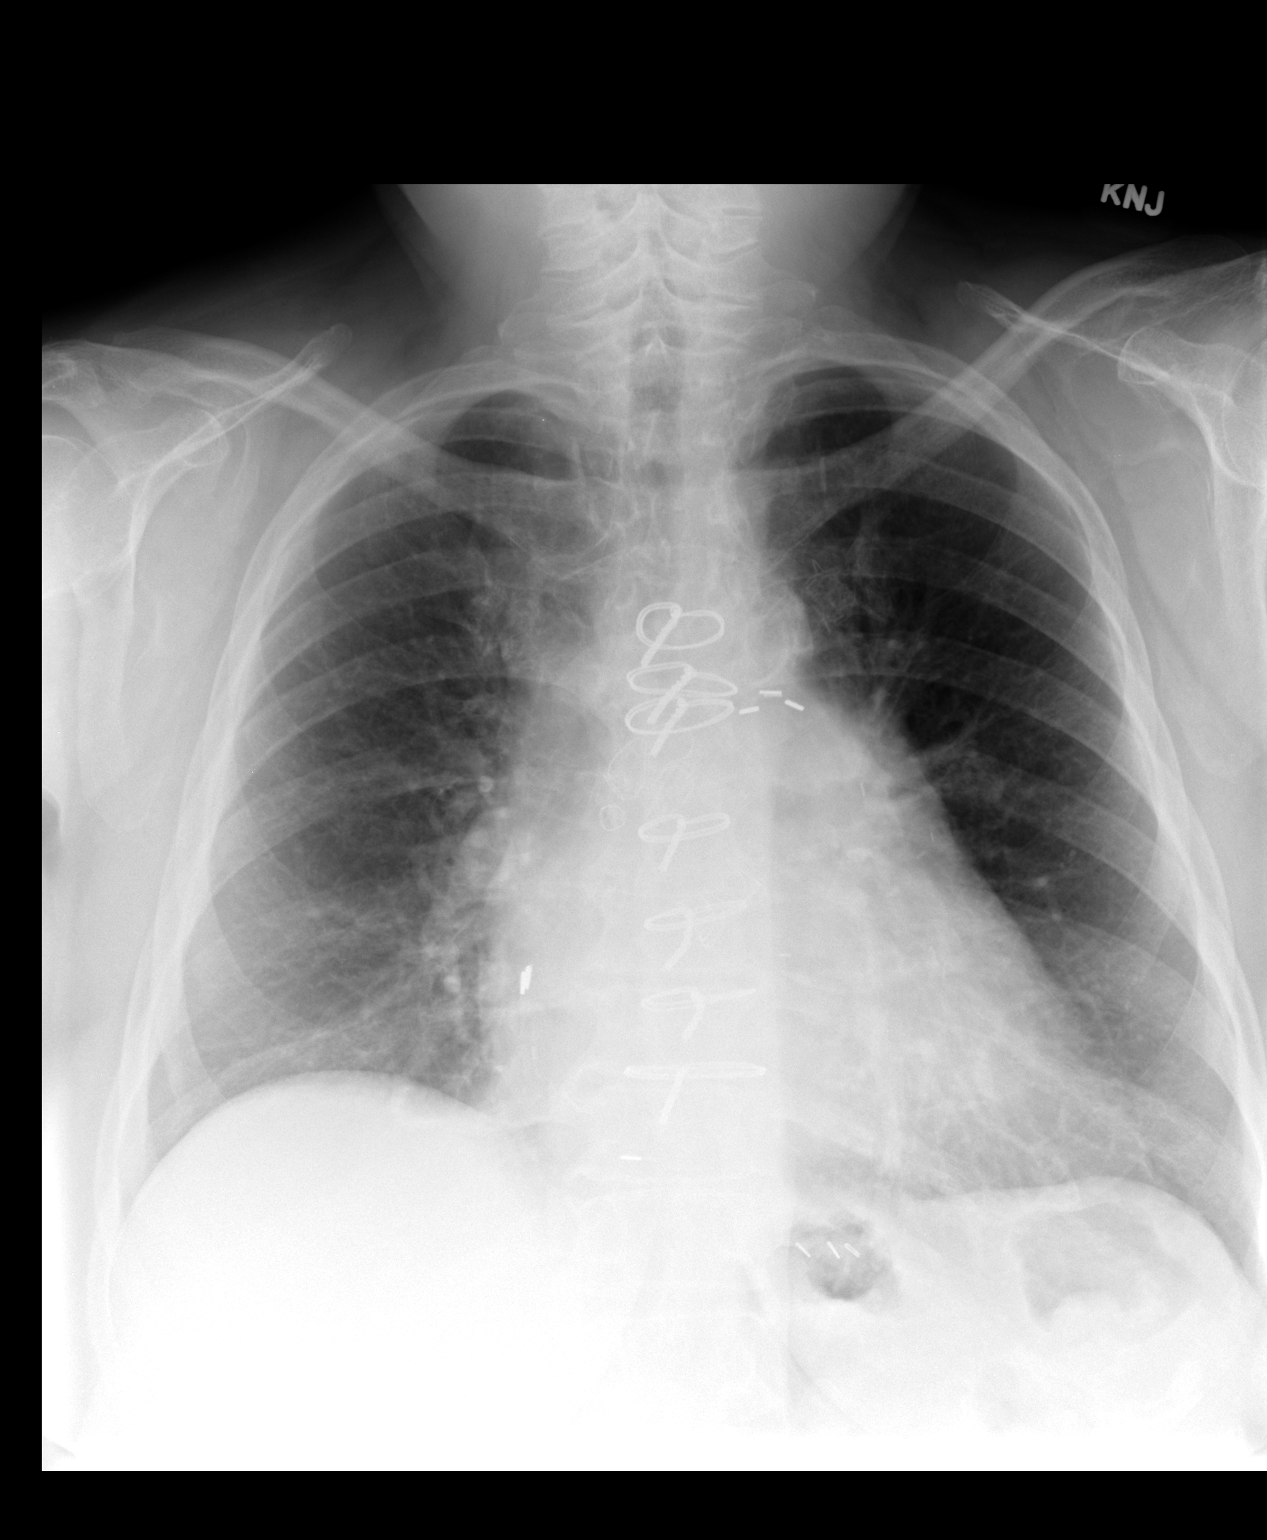

[view not recorded (2 of 2)]
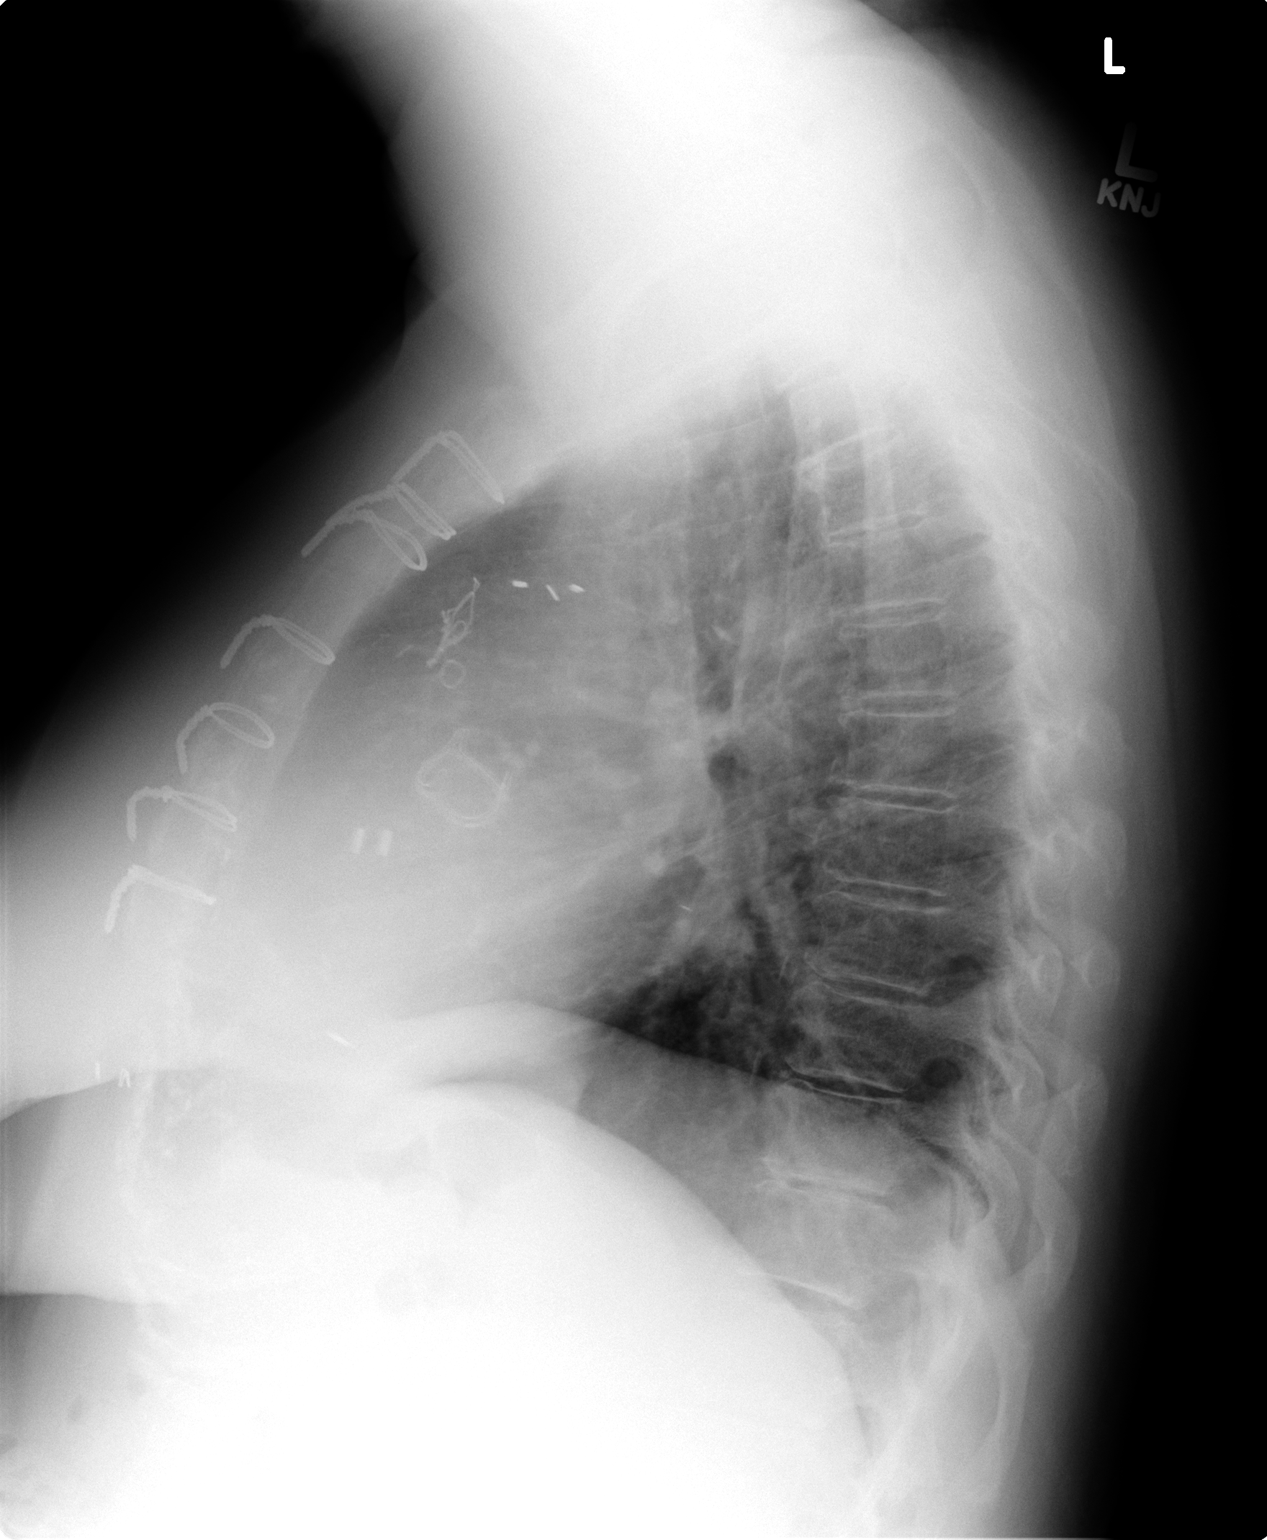

[2 of 2 positions shown; findings below may reference images not displayed]

FINDINGS: Mild cardiomegaly.  Sternal wire sutures and mediastinal
clips. Cardiac valve prosthesis.  Peribronchial thickening.  No
infiltrate.
IMPRESSION: Cardiomegaly.  Bronchitic changes.

## 2011-03-04 IMAGING — US US RENAL
1 series · 14 of 24 positions shown · non-contrast
Comparison: None.

CLINICAL DATA: Elevated creatinine level

RENAL/URINARY TRACT ULTRASOUND COMPLETE

[Series 1: us renal · 0.30mm/px · 14 of 24 slices shown]
[im 1/24]
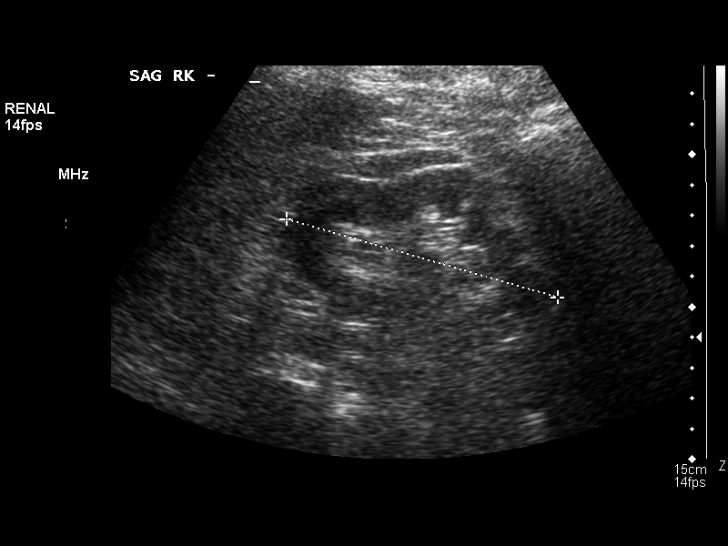
[im 3/24]
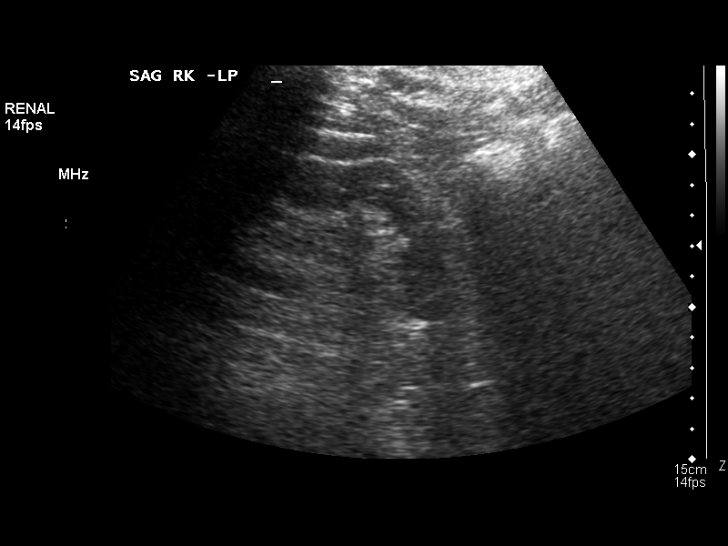
[im 5/24]
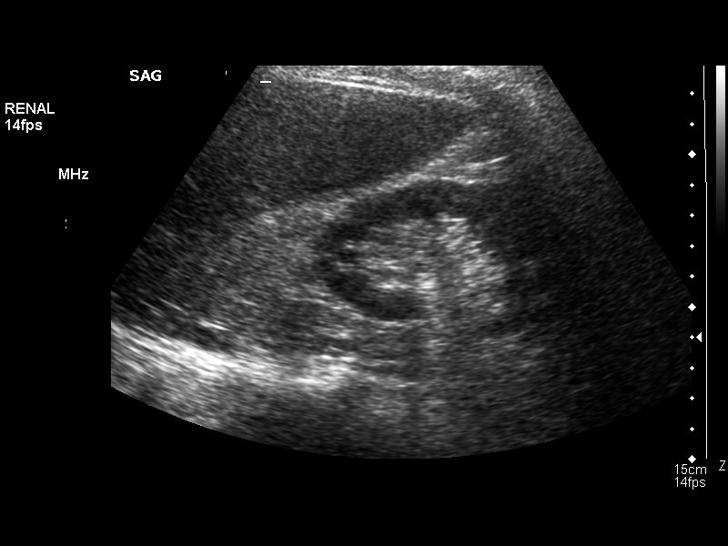
[im 7/24]
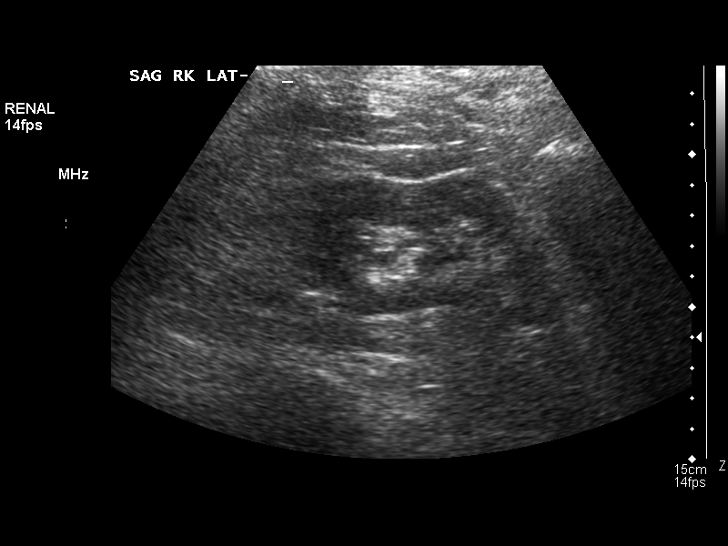
[im 8/24]
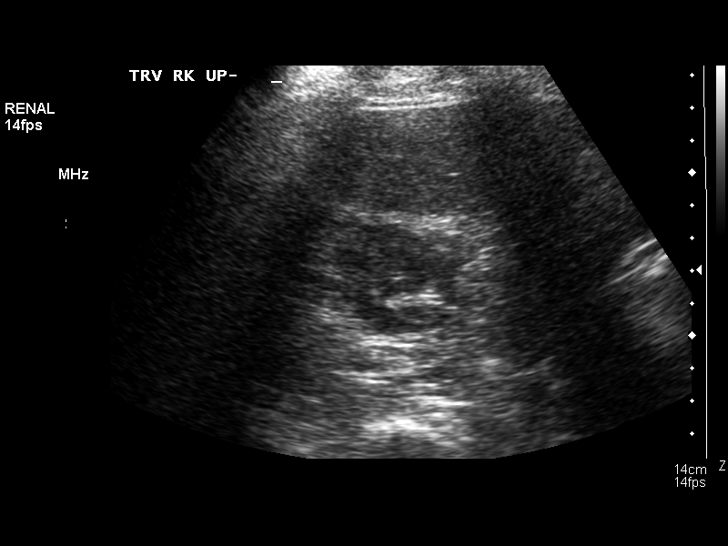
[im 10/24]
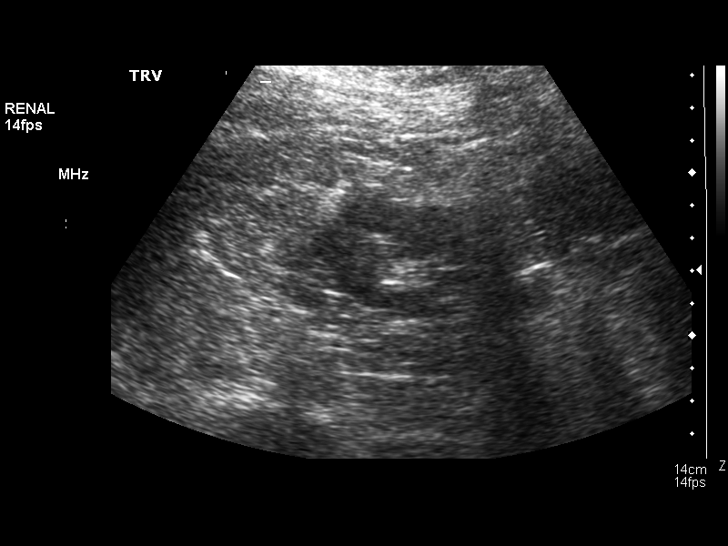
[im 12/24]
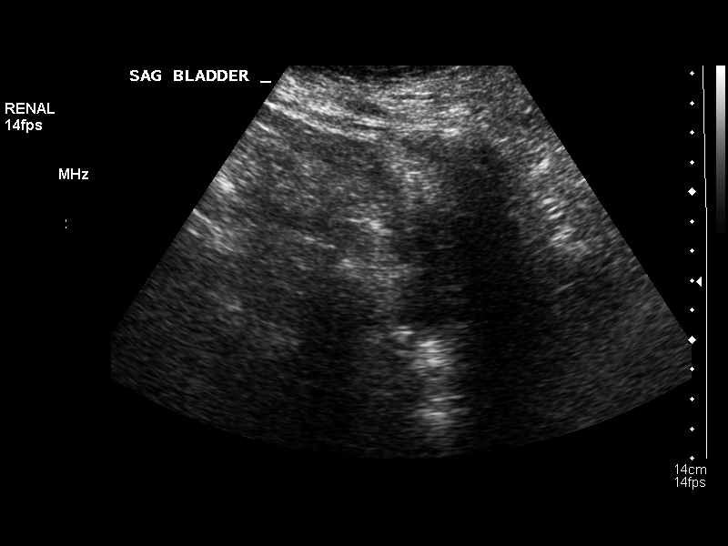
[im 13/24]
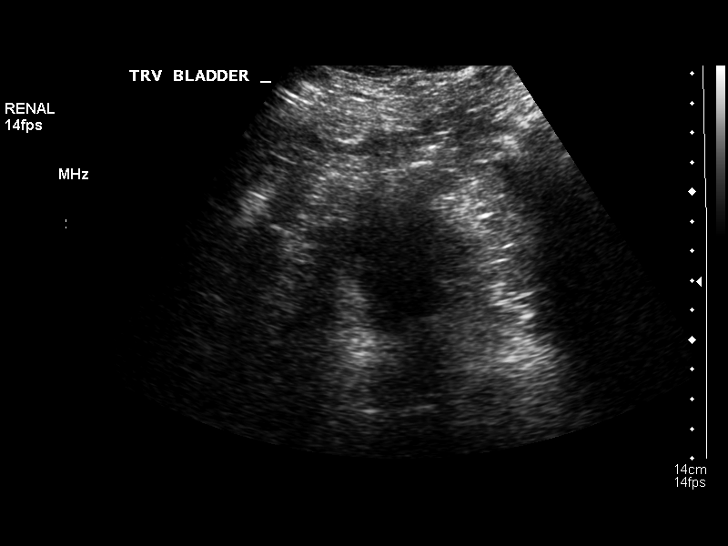
[im 15/24]
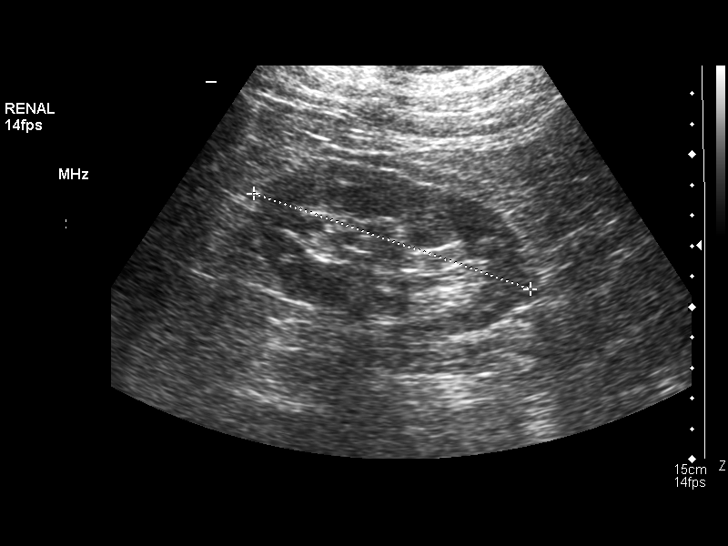
[im 17/24]
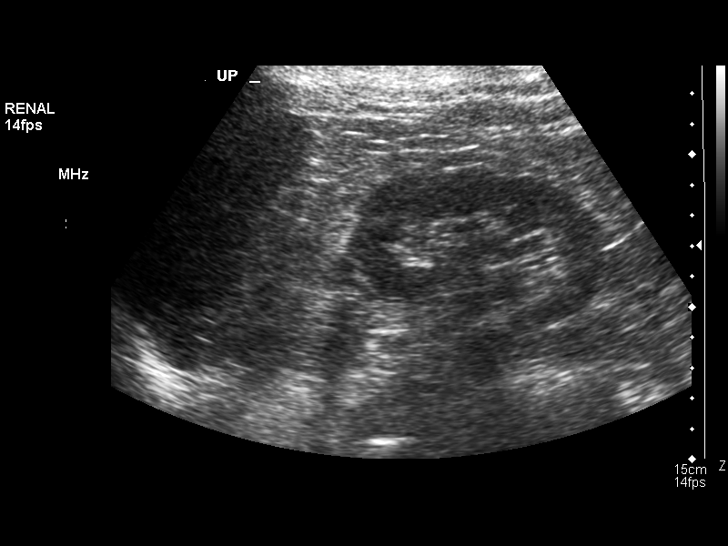
[im 19/24]
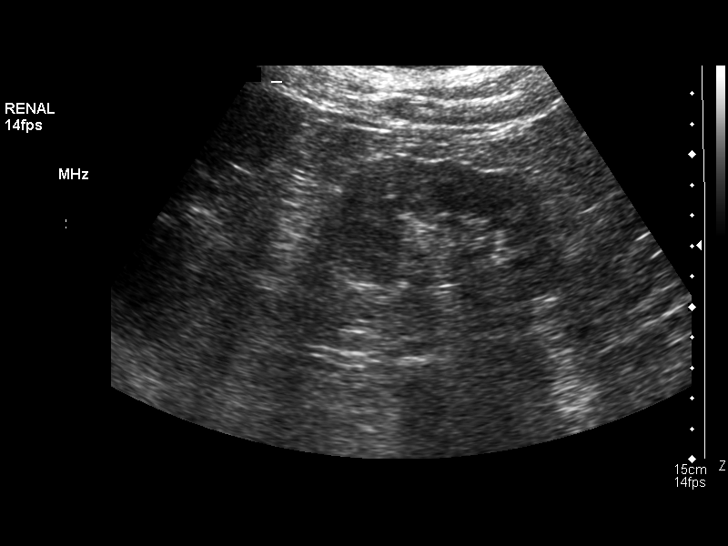
[im 20/24]
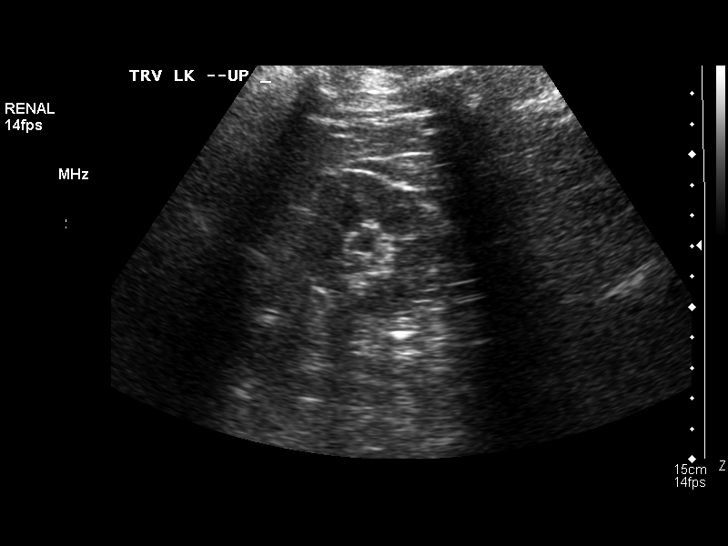
[im 22/24]
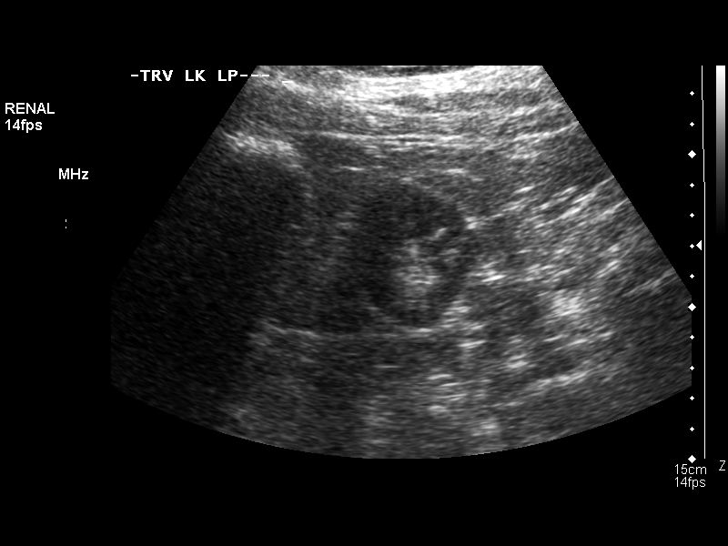
[im 24/24]
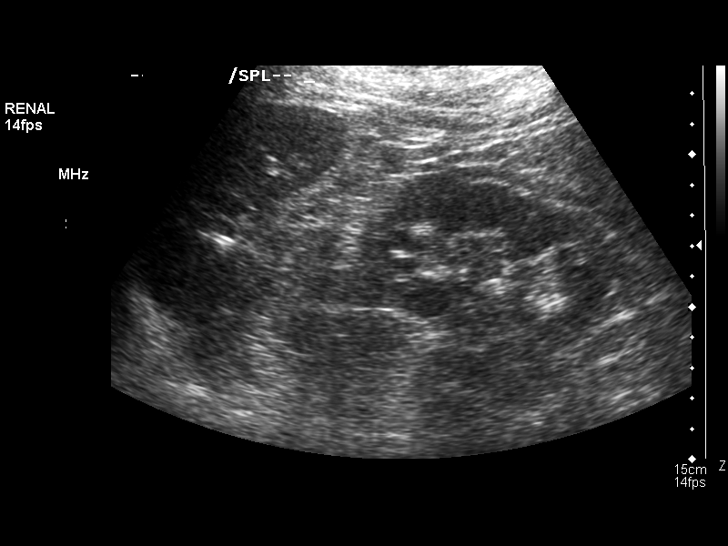

[14 of 24 positions shown; findings below may reference images not displayed]

FINDINGS: Right Kidney:  No hydronephrosis is seen.  The right kidney
measures 9.3 cm sagittally.  The echogenicity of the renal
parenchyma is within normal limits.

Left Kidney:  No hydronephrosis.  The left kidney measures 9.6 cm.
The echogenicity of the renal parenchyma is within normal limits.

Bladder:  The urinary bladder is decompressed and cannot be well
evaluated.
IMPRESSION: No hydronephrosis.

## 2011-03-09 LAB — URINALYSIS, ROUTINE W REFLEX MICROSCOPIC
Bilirubin Urine: NEGATIVE
Glucose, UA: 250 — AB
Hgb urine dipstick: NEGATIVE
Ketones, ur: NEGATIVE
Nitrite: NEGATIVE
Protein, ur: NEGATIVE
Specific Gravity, Urine: 1.013
Urobilinogen, UA: 1
pH: 5

## 2011-03-09 LAB — COMPREHENSIVE METABOLIC PANEL
ALT: 52 — ABNORMAL HIGH
AST: 51 — ABNORMAL HIGH
Albumin: 3.5
Alkaline Phosphatase: 34 — ABNORMAL LOW
BUN: 18
CO2: 29
Calcium: 9.6
Chloride: 101
Creatinine, Ser: 1.29 — ABNORMAL HIGH
GFR calc Af Amer: 51 — ABNORMAL LOW
GFR calc non Af Amer: 42 — ABNORMAL LOW
Glucose, Bld: 120 — ABNORMAL HIGH
Potassium: 4.8
Sodium: 136
Total Bilirubin: 0.7
Total Protein: 6.6

## 2011-03-09 LAB — CBC
HCT: 32.9 — ABNORMAL LOW
Hemoglobin: 11.1 — ABNORMAL LOW
MCHC: 33.9
MCV: 91
Platelets: 197
RBC: 3.62 — ABNORMAL LOW
RDW: 14.1
WBC: 5.8

## 2011-03-09 LAB — PROTIME-INR
INR: 1
Prothrombin Time: 13.3

## 2011-03-09 LAB — APTT: aPTT: 27

## 2011-03-09 LAB — URINE MICROSCOPIC-ADD ON

## 2011-03-09 LAB — TSH: TSH: 1.434

## 2011-03-13 LAB — PREPARE RBC (CROSSMATCH)

## 2011-03-13 LAB — GLUCOSE, CAPILLARY
Glucose-Capillary: 107 — ABNORMAL HIGH
Glucose-Capillary: 107 — ABNORMAL HIGH
Glucose-Capillary: 107 — ABNORMAL HIGH
Glucose-Capillary: 118 — ABNORMAL HIGH
Glucose-Capillary: 133 — ABNORMAL HIGH
Glucose-Capillary: 139 — ABNORMAL HIGH
Glucose-Capillary: 140 — ABNORMAL HIGH
Glucose-Capillary: 145 — ABNORMAL HIGH
Glucose-Capillary: 149 — ABNORMAL HIGH
Glucose-Capillary: 154 — ABNORMAL HIGH
Glucose-Capillary: 159 — ABNORMAL HIGH
Glucose-Capillary: 162 — ABNORMAL HIGH
Glucose-Capillary: 174 — ABNORMAL HIGH
Glucose-Capillary: 176 — ABNORMAL HIGH
Glucose-Capillary: 177 — ABNORMAL HIGH
Glucose-Capillary: 184 — ABNORMAL HIGH
Glucose-Capillary: 185 — ABNORMAL HIGH
Glucose-Capillary: 186 — ABNORMAL HIGH
Glucose-Capillary: 186 — ABNORMAL HIGH
Glucose-Capillary: 187 — ABNORMAL HIGH
Glucose-Capillary: 188 — ABNORMAL HIGH
Glucose-Capillary: 189 — ABNORMAL HIGH
Glucose-Capillary: 192 — ABNORMAL HIGH
Glucose-Capillary: 201 — ABNORMAL HIGH
Glucose-Capillary: 201 — ABNORMAL HIGH
Glucose-Capillary: 202 — ABNORMAL HIGH
Glucose-Capillary: 204 — ABNORMAL HIGH
Glucose-Capillary: 233 — ABNORMAL HIGH
Glucose-Capillary: 266 — ABNORMAL HIGH
Glucose-Capillary: 94
Glucose-Capillary: 99

## 2011-03-13 LAB — BASIC METABOLIC PANEL
BUN: 18
BUN: 21
BUN: 27 — ABNORMAL HIGH
BUN: 32 — ABNORMAL HIGH
BUN: 34 — ABNORMAL HIGH
BUN: 39 — ABNORMAL HIGH
CO2: 24
CO2: 26
CO2: 28
CO2: 29
CO2: 30
CO2: 34 — ABNORMAL HIGH
Calcium: 7.3 — ABNORMAL LOW
Calcium: 7.5 — ABNORMAL LOW
Calcium: 7.8 — ABNORMAL LOW
Calcium: 8.3 — ABNORMAL LOW
Calcium: 8.5
Calcium: 9.1
Chloride: 103
Chloride: 103
Chloride: 105
Chloride: 105
Chloride: 109
Chloride: 97
Creatinine, Ser: 1.43 — ABNORMAL HIGH
Creatinine, Ser: 1.46 — ABNORMAL HIGH
Creatinine, Ser: 1.52 — ABNORMAL HIGH
Creatinine, Ser: 1.68 — ABNORMAL HIGH
Creatinine, Ser: 1.76 — ABNORMAL HIGH
Creatinine, Ser: 1.94 — ABNORMAL HIGH
GFR calc Af Amer: 32 — ABNORMAL LOW
GFR calc Af Amer: 35 — ABNORMAL LOW
GFR calc Af Amer: 37 — ABNORMAL LOW
GFR calc Af Amer: 42 — ABNORMAL LOW
GFR calc Af Amer: 44 — ABNORMAL LOW
GFR calc Af Amer: 45 — ABNORMAL LOW
GFR calc non Af Amer: 26 — ABNORMAL LOW
GFR calc non Af Amer: 29 — ABNORMAL LOW
GFR calc non Af Amer: 31 — ABNORMAL LOW
GFR calc non Af Amer: 35 — ABNORMAL LOW
GFR calc non Af Amer: 36 — ABNORMAL LOW
GFR calc non Af Amer: 37 — ABNORMAL LOW
Glucose, Bld: 105 — ABNORMAL HIGH
Glucose, Bld: 124 — ABNORMAL HIGH
Glucose, Bld: 149 — ABNORMAL HIGH
Glucose, Bld: 153 — ABNORMAL HIGH
Glucose, Bld: 198 — ABNORMAL HIGH
Glucose, Bld: 222 — ABNORMAL HIGH
Potassium: 3.8
Potassium: 4
Potassium: 4.2
Potassium: 4.3
Potassium: 4.4
Potassium: 5.4 — ABNORMAL HIGH
Sodium: 136
Sodium: 137
Sodium: 137
Sodium: 139
Sodium: 140
Sodium: 140

## 2011-03-13 LAB — CBC
HCT: 22.2 — ABNORMAL LOW
HCT: 23.7 — ABNORMAL LOW
HCT: 25.1 — ABNORMAL LOW
HCT: 25.8 — ABNORMAL LOW
HCT: 26.2 — ABNORMAL LOW
HCT: 26.7 — ABNORMAL LOW
HCT: 27.5 — ABNORMAL LOW
HCT: 27.6 — ABNORMAL LOW
HCT: 31.2 — ABNORMAL LOW
Hemoglobin: 10.4 — ABNORMAL LOW
Hemoglobin: 7.6 — CL
Hemoglobin: 8.3 — ABNORMAL LOW
Hemoglobin: 8.7 — ABNORMAL LOW
Hemoglobin: 8.8 — ABNORMAL LOW
Hemoglobin: 8.8 — ABNORMAL LOW
Hemoglobin: 9.2 — ABNORMAL LOW
Hemoglobin: 9.4 — ABNORMAL LOW
Hemoglobin: 9.5 — ABNORMAL LOW
MCHC: 33.4
MCHC: 33.6
MCHC: 33.9
MCHC: 34
MCHC: 34.2
MCHC: 34.4
MCHC: 34.5
MCHC: 34.5
MCHC: 35.2
MCV: 87.7
MCV: 87.8
MCV: 88.5
MCV: 88.8
MCV: 89.1
MCV: 89.2
MCV: 89.4
MCV: 90.4
MCV: 90.8
Platelets: 120 — ABNORMAL LOW
Platelets: 140 — ABNORMAL LOW
Platelets: 221
Platelets: 55 — ABNORMAL LOW
Platelets: 57 — ABNORMAL LOW
Platelets: 66 — ABNORMAL LOW
Platelets: 70 — ABNORMAL LOW
Platelets: 88 — ABNORMAL LOW
Platelets: 94 — ABNORMAL LOW
RBC: 2.5 — ABNORMAL LOW
RBC: 2.7 — ABNORMAL LOW
RBC: 2.82 — ABNORMAL LOW
RBC: 2.9 — ABNORMAL LOW
RBC: 2.9 — ABNORMAL LOW
RBC: 3.02 — ABNORMAL LOW
RBC: 3.07 — ABNORMAL LOW
RBC: 3.15 — ABNORMAL LOW
RBC: 3.43 — ABNORMAL LOW
RDW: 13.1
RDW: 14.1
RDW: 14.2
RDW: 14.3
RDW: 14.4
RDW: 14.6
RDW: 14.8
RDW: 14.9
RDW: 15
WBC: 10.1
WBC: 10.1
WBC: 10.5
WBC: 10.9 — ABNORMAL HIGH
WBC: 11.5 — ABNORMAL HIGH
WBC: 13.4 — ABNORMAL HIGH
WBC: 13.6 — ABNORMAL HIGH
WBC: 6
WBC: 8.6

## 2011-03-13 LAB — POCT I-STAT 3, ART BLOOD GAS (G3+)
Acid-Base Excess: 2
Acid-Base Excess: 2
Acid-Base Excess: 3 — ABNORMAL HIGH
Acid-Base Excess: 3 — ABNORMAL HIGH
Acid-base deficit: 1
Acid-base deficit: 2
Acid-base deficit: 2
Bicarbonate: 24.4 — ABNORMAL HIGH
Bicarbonate: 25.4 — ABNORMAL HIGH
Bicarbonate: 26 — ABNORMAL HIGH
Bicarbonate: 26.4 — ABNORMAL HIGH
Bicarbonate: 26.5 — ABNORMAL HIGH
Bicarbonate: 26.5 — ABNORMAL HIGH
Bicarbonate: 26.7 — ABNORMAL HIGH
Bicarbonate: 27.4 — ABNORMAL HIGH
Bicarbonate: 28.1 — ABNORMAL HIGH
Bicarbonate: 28.2 — ABNORMAL HIGH
O2 Saturation: 100
O2 Saturation: 100
O2 Saturation: 100
O2 Saturation: 100
O2 Saturation: 100
O2 Saturation: 86
O2 Saturation: 88
O2 Saturation: 91
O2 Saturation: 97
O2 Saturation: 98
Patient temperature: 36.1
Patient temperature: 36.2
Patient temperature: 37.2
Patient temperature: 37.5
Patient temperature: 38.2
TCO2: 26
TCO2: 27
TCO2: 28
TCO2: 28
TCO2: 28
TCO2: 28
TCO2: 29
TCO2: 29
TCO2: 29
TCO2: 29
pCO2 arterial: 38.5
pCO2 arterial: 40.3
pCO2 arterial: 42.6
pCO2 arterial: 44.1
pCO2 arterial: 45.6 — ABNORMAL HIGH
pCO2 arterial: 47.7 — ABNORMAL HIGH
pCO2 arterial: 48.5 — ABNORMAL HIGH
pCO2 arterial: 55 — ABNORMAL HIGH
pCO2 arterial: 61.6
pCO2 arterial: 62.5
pH, Arterial: 7.234 — ABNORMAL LOW
pH, Arterial: 7.237 — ABNORMAL LOW
pH, Arterial: 7.282 — ABNORMAL LOW
pH, Arterial: 7.346 — ABNORMAL LOW
pH, Arterial: 7.368
pH, Arterial: 7.382
pH, Arterial: 7.399
pH, Arterial: 7.415 — ABNORMAL HIGH
pH, Arterial: 7.416 — ABNORMAL HIGH
pH, Arterial: 7.425 — ABNORMAL HIGH
pO2, Arterial: 120 — ABNORMAL HIGH
pO2, Arterial: 172 — ABNORMAL HIGH
pO2, Arterial: 269 — ABNORMAL HIGH
pO2, Arterial: 279 — ABNORMAL HIGH
pO2, Arterial: 283 — ABNORMAL HIGH
pO2, Arterial: 360 — ABNORMAL HIGH
pO2, Arterial: 59 — ABNORMAL LOW
pO2, Arterial: 63 — ABNORMAL LOW
pO2, Arterial: 63 — ABNORMAL LOW
pO2, Arterial: 98

## 2011-03-13 LAB — POCT I-STAT 3, VENOUS BLOOD GAS (G3P V)
Acid-Base Excess: 1
Acid-base deficit: 3 — ABNORMAL HIGH
Bicarbonate: 25.1 — ABNORMAL HIGH
Bicarbonate: 27.4 — ABNORMAL HIGH
O2 Saturation: 54
O2 Saturation: 87
TCO2: 27
TCO2: 29
pCO2, Ven: 49.3
pCO2, Ven: 59.4 — ABNORMAL HIGH
pH, Ven: 7.233 — ABNORMAL LOW
pH, Ven: 7.354 — ABNORMAL HIGH
pO2, Ven: 30
pO2, Ven: 64 — ABNORMAL HIGH

## 2011-03-13 LAB — POCT I-STAT GLUCOSE
Glucose, Bld: 104 — ABNORMAL HIGH
Glucose, Bld: 117 — ABNORMAL HIGH
Glucose, Bld: 149 — ABNORMAL HIGH
Operator id: 194801
Operator id: 300801
Operator id: 300801

## 2011-03-13 LAB — POCT I-STAT 4, (NA,K, GLUC, HGB,HCT)
Glucose, Bld: 101 — ABNORMAL HIGH
Glucose, Bld: 103 — ABNORMAL HIGH
Glucose, Bld: 106 — ABNORMAL HIGH
Glucose, Bld: 109 — ABNORMAL HIGH
Glucose, Bld: 111 — ABNORMAL HIGH
Glucose, Bld: 116 — ABNORMAL HIGH
Glucose, Bld: 117 — ABNORMAL HIGH
Glucose, Bld: 137 — ABNORMAL HIGH
Glucose, Bld: 141 — ABNORMAL HIGH
Glucose, Bld: 144 — ABNORMAL HIGH
Glucose, Bld: 149 — ABNORMAL HIGH
Glucose, Bld: 153 — ABNORMAL HIGH
Glucose, Bld: 156 — ABNORMAL HIGH
HCT: 20 — ABNORMAL LOW
HCT: 20 — ABNORMAL LOW
HCT: 21 — ABNORMAL LOW
HCT: 22 — ABNORMAL LOW
HCT: 22 — ABNORMAL LOW
HCT: 23 — ABNORMAL LOW
HCT: 24 — ABNORMAL LOW
HCT: 25 — ABNORMAL LOW
HCT: 25 — ABNORMAL LOW
HCT: 25 — ABNORMAL LOW
HCT: 25 — ABNORMAL LOW
HCT: 26 — ABNORMAL LOW
HCT: 27 — ABNORMAL LOW
Hemoglobin: 6.8 — CL
Hemoglobin: 6.8 — CL
Hemoglobin: 7.1 — CL
Hemoglobin: 7.5 — CL
Hemoglobin: 7.5 — CL
Hemoglobin: 7.8 — CL
Hemoglobin: 8.2 — ABNORMAL LOW
Hemoglobin: 8.5 — ABNORMAL LOW
Hemoglobin: 8.5 — ABNORMAL LOW
Hemoglobin: 8.5 — ABNORMAL LOW
Hemoglobin: 8.5 — ABNORMAL LOW
Hemoglobin: 8.8 — ABNORMAL LOW
Hemoglobin: 9.2 — ABNORMAL LOW
Potassium: 4.4
Potassium: 4.7
Potassium: 4.7
Potassium: 4.8
Potassium: 4.8
Potassium: 5.3 — ABNORMAL HIGH
Potassium: 5.3 — ABNORMAL HIGH
Potassium: 5.4 — ABNORMAL HIGH
Potassium: 5.5 — ABNORMAL HIGH
Potassium: 5.6 — ABNORMAL HIGH
Potassium: 5.6 — ABNORMAL HIGH
Potassium: 5.7 — ABNORMAL HIGH
Potassium: 6.1 — ABNORMAL HIGH
Sodium: 130 — ABNORMAL LOW
Sodium: 132 — ABNORMAL LOW
Sodium: 133 — ABNORMAL LOW
Sodium: 133 — ABNORMAL LOW
Sodium: 134 — ABNORMAL LOW
Sodium: 134 — ABNORMAL LOW
Sodium: 134 — ABNORMAL LOW
Sodium: 135
Sodium: 135
Sodium: 137
Sodium: 137
Sodium: 138
Sodium: 139

## 2011-03-13 LAB — CARDIAC PANEL(CRET KIN+CKTOT+MB+TROPI)
CK, MB: 13.4 — ABNORMAL HIGH
CK, MB: 31.2 — ABNORMAL HIGH
Relative Index: 1.6
Relative Index: 1.7
Total CK: 1878 — ABNORMAL HIGH
Total CK: 821 — ABNORMAL HIGH
Troponin I: 2.14
Troponin I: 3.63

## 2011-03-13 LAB — TYPE AND SCREEN
ABO/RH(D): O POS
Antibody Screen: NEGATIVE

## 2011-03-13 LAB — PROTIME-INR
INR: 1
INR: 1.7 — ABNORMAL HIGH
Prothrombin Time: 13.6
Prothrombin Time: 20.5 — ABNORMAL HIGH

## 2011-03-13 LAB — URINALYSIS, ROUTINE W REFLEX MICROSCOPIC
Bilirubin Urine: NEGATIVE
Glucose, UA: 500 — AB
Hgb urine dipstick: NEGATIVE
Ketones, ur: NEGATIVE
Nitrite: NEGATIVE
Protein, ur: NEGATIVE
Specific Gravity, Urine: 1.015
Urobilinogen, UA: 1
pH: 7

## 2011-03-13 LAB — BLOOD GAS, ARTERIAL
Acid-Base Excess: 0.9
Bicarbonate: 25 — ABNORMAL HIGH
Drawn by: 181601
FIO2: 0.21
O2 Saturation: 98.2
Patient temperature: 98.6
TCO2: 26.2
pCO2 arterial: 40.3
pH, Arterial: 7.409 — ABNORMAL HIGH
pO2, Arterial: 92.2

## 2011-03-13 LAB — POCT I-STAT, CHEM 8
BUN: 17
Calcium, Ion: 1.17
Chloride: 104
Creatinine, Ser: 1.4 — ABNORMAL HIGH
Glucose, Bld: 103 — ABNORMAL HIGH
HCT: 25 — ABNORMAL LOW
Hemoglobin: 8.5 — ABNORMAL LOW
Potassium: 4.3
Sodium: 139
TCO2: 25

## 2011-03-13 LAB — HEPARIN INDUCED THROMBOCYTOPENIA PNL
Heparin Induced Plt Ab: NEGATIVE
Patient O.D.: 0.094
Serotonin Release: 2 % release (ref <20)

## 2011-03-13 LAB — CREATININE, SERUM
Creatinine, Ser: 1.28 — ABNORMAL HIGH
Creatinine, Ser: 1.53 — ABNORMAL HIGH
GFR calc Af Amer: 42 — ABNORMAL LOW
GFR calc Af Amer: 51 — ABNORMAL LOW
GFR calc non Af Amer: 34 — ABNORMAL LOW
GFR calc non Af Amer: 42 — ABNORMAL LOW

## 2011-03-13 LAB — HEMOGLOBIN A1C
Hgb A1c MFr Bld: 8.3 — ABNORMAL HIGH
Mean Plasma Glucose: 192

## 2011-03-13 LAB — COMPREHENSIVE METABOLIC PANEL
ALT: 39 — ABNORMAL HIGH
AST: 35
Albumin: 3.5
Alkaline Phosphatase: 37 — ABNORMAL LOW
BUN: 17
CO2: 22
Calcium: 9.7
Chloride: 105
Creatinine, Ser: 1.33 — ABNORMAL HIGH
GFR calc Af Amer: 49 — ABNORMAL LOW
GFR calc non Af Amer: 40 — ABNORMAL LOW
Glucose, Bld: 140 — ABNORMAL HIGH
Potassium: 4.6
Sodium: 137
Total Bilirubin: 0.5
Total Protein: 6.5

## 2011-03-13 LAB — APTT
aPTT: 30
aPTT: 36

## 2011-03-13 LAB — HEMOGLOBIN AND HEMATOCRIT, BLOOD
HCT: 21.8 — ABNORMAL LOW
Hemoglobin: 7.4 — CL

## 2011-03-13 LAB — PREPARE PLATELET PHERESIS

## 2011-03-13 LAB — HEPARIN ANTIBODY SCREEN: Heparin Antibody Screen: POSITIVE

## 2011-03-13 LAB — MAGNESIUM
Magnesium: 2
Magnesium: 2.1
Magnesium: 2.6 — ABNORMAL HIGH

## 2011-03-13 LAB — ABO/RH: ABO/RH(D): O POS

## 2011-03-13 LAB — PREPARE FRESH FROZEN PLASMA

## 2011-03-13 LAB — PLATELET COUNT: Platelets: 91 — ABNORMAL LOW

## 2011-03-14 LAB — GLUCOSE, CAPILLARY
Glucose-Capillary: 111 — ABNORMAL HIGH
Glucose-Capillary: 112 — ABNORMAL HIGH
Glucose-Capillary: 116 — ABNORMAL HIGH
Glucose-Capillary: 121 — ABNORMAL HIGH
Glucose-Capillary: 123 — ABNORMAL HIGH
Glucose-Capillary: 123 — ABNORMAL HIGH
Glucose-Capillary: 157 — ABNORMAL HIGH
Glucose-Capillary: 158 — ABNORMAL HIGH
Glucose-Capillary: 164 — ABNORMAL HIGH
Glucose-Capillary: 165 — ABNORMAL HIGH
Glucose-Capillary: 176 — ABNORMAL HIGH
Glucose-Capillary: 181 — ABNORMAL HIGH
Glucose-Capillary: 184 — ABNORMAL HIGH
Glucose-Capillary: 202 — ABNORMAL HIGH
Glucose-Capillary: 202 — ABNORMAL HIGH
Glucose-Capillary: 209 — ABNORMAL HIGH
Glucose-Capillary: 214 — ABNORMAL HIGH
Glucose-Capillary: 219 — ABNORMAL HIGH
Glucose-Capillary: 220 — ABNORMAL HIGH
Glucose-Capillary: 252 — ABNORMAL HIGH
Glucose-Capillary: 258 — ABNORMAL HIGH
Glucose-Capillary: 274 — ABNORMAL HIGH
Glucose-Capillary: 319 — ABNORMAL HIGH
Glucose-Capillary: 67 — ABNORMAL LOW
Glucose-Capillary: 74
Glucose-Capillary: 93
Glucose-Capillary: 96

## 2011-03-14 LAB — BASIC METABOLIC PANEL
BUN: 36 — ABNORMAL HIGH
BUN: 37 — ABNORMAL HIGH
BUN: 37 — ABNORMAL HIGH
BUN: 40 — ABNORMAL HIGH
BUN: 42 — ABNORMAL HIGH
BUN: 45 — ABNORMAL HIGH
BUN: 47 — ABNORMAL HIGH
CO2: 29
CO2: 31
CO2: 31
CO2: 31
CO2: 32
CO2: 32
CO2: 33 — ABNORMAL HIGH
Calcium: 9.3
Calcium: 9.3
Calcium: 9.3
Calcium: 9.3
Calcium: 9.5
Calcium: 9.5
Calcium: 9.6
Chloride: 91 — ABNORMAL LOW
Chloride: 91 — ABNORMAL LOW
Chloride: 92 — ABNORMAL LOW
Chloride: 93 — ABNORMAL LOW
Chloride: 93 — ABNORMAL LOW
Chloride: 96
Chloride: 97
Creatinine, Ser: 1.57 — ABNORMAL HIGH
Creatinine, Ser: 1.59 — ABNORMAL HIGH
Creatinine, Ser: 1.63 — ABNORMAL HIGH
Creatinine, Ser: 1.76 — ABNORMAL HIGH
Creatinine, Ser: 1.79 — ABNORMAL HIGH
Creatinine, Ser: 1.79 — ABNORMAL HIGH
Creatinine, Ser: 1.86 — ABNORMAL HIGH
GFR calc Af Amer: 33 — ABNORMAL LOW
GFR calc Af Amer: 35 — ABNORMAL LOW
GFR calc Af Amer: 35 — ABNORMAL LOW
GFR calc Af Amer: 35 — ABNORMAL LOW
GFR calc Af Amer: 39 — ABNORMAL LOW
GFR calc Af Amer: 40 — ABNORMAL LOW
GFR calc Af Amer: 40 — ABNORMAL LOW
GFR calc non Af Amer: 27 — ABNORMAL LOW
GFR calc non Af Amer: 29 — ABNORMAL LOW
GFR calc non Af Amer: 29 — ABNORMAL LOW
GFR calc non Af Amer: 29 — ABNORMAL LOW
GFR calc non Af Amer: 32 — ABNORMAL LOW
GFR calc non Af Amer: 33 — ABNORMAL LOW
GFR calc non Af Amer: 33 — ABNORMAL LOW
Glucose, Bld: 102 — ABNORMAL HIGH
Glucose, Bld: 105 — ABNORMAL HIGH
Glucose, Bld: 112 — ABNORMAL HIGH
Glucose, Bld: 128 — ABNORMAL HIGH
Glucose, Bld: 177 — ABNORMAL HIGH
Glucose, Bld: 270 — ABNORMAL HIGH
Glucose, Bld: 76
Potassium: 3.9
Potassium: 4.1
Potassium: 4.2
Potassium: 4.3
Potassium: 4.4
Potassium: 4.6
Potassium: 4.6
Sodium: 132 — ABNORMAL LOW
Sodium: 132 — ABNORMAL LOW
Sodium: 133 — ABNORMAL LOW
Sodium: 133 — ABNORMAL LOW
Sodium: 133 — ABNORMAL LOW
Sodium: 135
Sodium: 135

## 2011-03-14 LAB — CBC
HCT: 24.4 — ABNORMAL LOW
HCT: 26.6 — ABNORMAL LOW
Hemoglobin: 8.3 — ABNORMAL LOW
Hemoglobin: 9.2 — ABNORMAL LOW
MCHC: 34
MCHC: 34.7
MCV: 89
MCV: 89.2
Platelets: 154
Platelets: 273
RBC: 2.74 — ABNORMAL LOW
RBC: 2.98 — ABNORMAL LOW
RDW: 14.3
RDW: 14.8
WBC: 9.4
WBC: 9.7

## 2011-03-14 LAB — URINALYSIS, ROUTINE W REFLEX MICROSCOPIC
Bilirubin Urine: NEGATIVE
Glucose, UA: NEGATIVE
Hgb urine dipstick: NEGATIVE
Ketones, ur: NEGATIVE
Nitrite: NEGATIVE
Protein, ur: NEGATIVE
Specific Gravity, Urine: 1.008
Urobilinogen, UA: 1
pH: 5.5

## 2011-03-14 LAB — URINE CULTURE
Colony Count: NO GROWTH
Culture: NO GROWTH

## 2011-03-14 LAB — URINE MICROSCOPIC-ADD ON

## 2011-03-16 LAB — GLUCOSE, CAPILLARY
Glucose-Capillary: 213 mg/dL — ABNORMAL HIGH (ref 70–99)
Glucose-Capillary: 108 mg/dL — ABNORMAL HIGH (ref 70–99)
Glucose-Capillary: 120 mg/dL — ABNORMAL HIGH (ref 70–99)
Glucose-Capillary: 137 mg/dL — ABNORMAL HIGH (ref 70–99)
Glucose-Capillary: 154 mg/dL — ABNORMAL HIGH (ref 70–99)
Glucose-Capillary: 161 mg/dL — ABNORMAL HIGH (ref 70–99)
Glucose-Capillary: 164 mg/dL — ABNORMAL HIGH (ref 70–99)
Glucose-Capillary: 174 mg/dL — ABNORMAL HIGH (ref 70–99)
Glucose-Capillary: 183 mg/dL — ABNORMAL HIGH (ref 70–99)
Glucose-Capillary: 187 mg/dL — ABNORMAL HIGH (ref 70–99)
Glucose-Capillary: 194 mg/dL — ABNORMAL HIGH (ref 70–99)
Glucose-Capillary: 198 mg/dL — ABNORMAL HIGH (ref 70–99)
Glucose-Capillary: 220 mg/dL — ABNORMAL HIGH (ref 70–99)
Glucose-Capillary: 243 mg/dL — ABNORMAL HIGH (ref 70–99)
Glucose-Capillary: 265 mg/dL — ABNORMAL HIGH (ref 70–99)
Glucose-Capillary: 279 mg/dL — ABNORMAL HIGH (ref 70–99)
Glucose-Capillary: 291 mg/dL — ABNORMAL HIGH (ref 70–99)
Glucose-Capillary: 72 mg/dL (ref 70–99)
Glucose-Capillary: 90 mg/dL (ref 70–99)

## 2011-04-06 ENCOUNTER — Telehealth: Payer: Self-pay | Admitting: Emergency Medicine

## 2011-04-06 MED ORDER — DOXYCYCLINE HYCLATE 100 MG PO TABS: ORAL_TABLET | ORAL | Status: AC

## 2011-04-06 MED ORDER — PREDNISONE 10 MG PO TABS: ORAL_TABLET | ORAL | Status: DC

## 2011-04-06 NOTE — Telephone Encounter (Signed)
Spoke with pt and notified of recs per MR. Pt verbalized understanding and meds sent to pharm. Pt aware to go to ED sooner if worsens.

## 2011-04-06 NOTE — Telephone Encounter (Signed)
Called and spoke with pt and she stated that her cough started yesterday and is worse today with green/grey sputum.  Just not feeling well today.  Denies fever.  She is using all of her meds regularly but feels she needs abx or something to help her.  MR  please advise. Thanks  Allergies  Allergen Reactions  . Amoxicillin     REACTION: rash  . Tetracycline     REACTION: unknown

## 2011-04-06 NOTE — Telephone Encounter (Signed)
Reviewed chart: she is asthmatic high risk due to frequent pred bursts each year  rec Please take doxycycline 100mg  twice daily after meals x 5 days; avoid sunlight Please take prednisone 40mg  once daily x 3 days, then 20mg  once daily x 3 days, then 10mg  once daily x 3 days, then 5mg  once dailyx 3 days and stop Er if worse Fu byrum

## 2011-04-24 ENCOUNTER — Encounter: Payer: Self-pay | Admitting: Emergency Medicine

## 2011-04-24 ENCOUNTER — Ambulatory Visit (INDEPENDENT_AMBULATORY_CARE_PROVIDER_SITE_OTHER): Payer: Medicare Other | Admitting: Emergency Medicine

## 2011-04-24 DIAGNOSIS — J45909 Unspecified asthma, uncomplicated: Secondary | ICD-10-CM

## 2011-04-24 DIAGNOSIS — J309 Allergic rhinitis, unspecified: Secondary | ICD-10-CM

## 2011-04-24 NOTE — Assessment & Plan Note (Signed)
Loratadine astelin prn

## 2011-04-24 NOTE — Patient Instructions (Signed)
Continue same meds as before. Return in 6 months for follow-up with Dr. Lamonte Sakai

## 2011-04-24 NOTE — Progress Notes (Signed)
  Subjective:    Patient ID: Hannah [name], female    DOB: [birth year], 65 y.o.   MRN: [id number]  HPI 65 yo never smoker, hx of CAD/CABG, aortic valve replacement, A Fib. Also with hx of childhood asthma. She was diagnosed with adult asthma in her 22's in setting wheeze, SOB, cough that wouldn't go away. Was on prn SABA for a while, started on Symbicort early 2009. Had PFT's done in August 2009 and 2010 that showed mixed disease. In 10/10 FEV1 1.01L. Averages Prednisone about 3 - 4x a year. Never hospitalized or on MV due to breathing. Amiodarone stopped late 2010.   ROV 10/14/09 -- returns for f/u of her asthma, restrictive disease. Tells me that she has had PND, allergy symptoms this spring, making her mor SOB and giving her more cough.   ROV 03/07/10 -- Hx CAD/CABG, asthma and allergies. Still with some nasal congestion, doing loratadine and fluticasone spray, rarely [place]. Does some throat clearing. Taking her symbicort reliably. Uses ventolin a couple times a month.   ROV 10/04/10 -- Hx CAD/CABG, asthma and allergies. Has been having more trouble with nasal gtt and allergies and therefore more cough chest tightness. Using Symbicort. Has needed ventolin about 4x a day for last week. More SOB. Can't tolerate the NSWs.   ROV 04/24/11 -- Hx CAD/CABG, asthma and allergies. Here for follow up. She was treated over phone for bronchitis after URI - better after doxy and prednisone. Still on Symbicort bid. She is much improved, back to baseline. She has had the flu shot. She is rarely using SABA - was requiring frequently when she was exacerbated. She is having several AE a year.    Review of Systems as per HPI    Objective:   Physical Exam Gen: Pleasant, obese, in no distress,  normal affect  ENT: No lesions,  mouth clear,  oropharynx clear, no postnasal drip  Neck: No JVD, no TMG, no carotid bruits  Lungs: No use of accessory muscles, no dullness to percussion, clear without rales or  rhonchi  Cardiovascular: RRR, heart sounds normal, no murmur or gallops, no peripheral edema  Musculoskeletal: No deformities, no cyanosis or clubbing  Neuro: alert, non focal  Skin: Warm, no lesions or rashes   Assessment & Plan:  ALLERGIC RHINITIS Loratadine astelin prn  ASTHMA Improved after AE asthma - continue same regimen - rov 6 mon

## 2011-04-24 NOTE — Assessment & Plan Note (Signed)
Improved after AE asthma - continue same regimen - rov 6 mon

## 2011-06-12 DIAGNOSIS — I639 Cerebral infarction, unspecified: Secondary | ICD-10-CM

## 2011-06-12 HISTORY — DX: Cerebral infarction, unspecified: I63.9

## 2011-06-20 ENCOUNTER — Other Ambulatory Visit: Payer: Self-pay | Admitting: Cardiology

## 2011-06-26 ENCOUNTER — Other Ambulatory Visit: Payer: Self-pay | Admitting: Nurse Practitioner

## 2011-07-25 ENCOUNTER — Ambulatory Visit (INDEPENDENT_AMBULATORY_CARE_PROVIDER_SITE_OTHER): Payer: Medicare Other | Admitting: Cardiology

## 2011-07-25 ENCOUNTER — Encounter: Payer: Self-pay | Admitting: Cardiology

## 2011-07-25 VITALS — BP 126/81 | HR 91 | Ht 64.0 in

## 2011-07-25 DIAGNOSIS — I2510 Atherosclerotic heart disease of native coronary artery without angina pectoris: Secondary | ICD-10-CM

## 2011-07-25 DIAGNOSIS — R0602 Shortness of breath: Secondary | ICD-10-CM

## 2011-07-25 DIAGNOSIS — I509 Heart failure, unspecified: Secondary | ICD-10-CM

## 2011-07-25 DIAGNOSIS — I4891 Unspecified atrial fibrillation: Secondary | ICD-10-CM

## 2011-07-25 DIAGNOSIS — I25810 Atherosclerosis of coronary artery bypass graft(s) without angina pectoris: Secondary | ICD-10-CM

## 2011-07-25 DIAGNOSIS — Z952 Presence of prosthetic heart valve: Secondary | ICD-10-CM

## 2011-07-25 DIAGNOSIS — E785 Hyperlipidemia, unspecified: Secondary | ICD-10-CM

## 2011-07-25 DIAGNOSIS — I359 Nonrheumatic aortic valve disorder, unspecified: Secondary | ICD-10-CM

## 2011-07-25 LAB — BASIC METABOLIC PANEL
BUN: 24 mg/dL — ABNORMAL HIGH (ref 6–23)
CO2: 28 mEq/L (ref 19–32)
Calcium: 9.3 mg/dL (ref 8.4–10.5)
Chloride: 100 mEq/L (ref 96–112)
Creatinine, Ser: 1.1 mg/dL (ref 0.4–1.2)
GFR: 53.97 mL/min — ABNORMAL LOW (ref >60.00)
Glucose, Bld: 168 mg/dL — ABNORMAL HIGH (ref 70–99)
Potassium: 5.3 mEq/L — ABNORMAL HIGH (ref 3.5–5.1)
Sodium: 136 mEq/L (ref 135–145)

## 2011-07-25 LAB — LIPID PANEL
Cholesterol: 214 mg/dL — ABNORMAL HIGH (ref 0–200)
HDL: 40.7 mg/dL (ref >39.00)
Total CHOL/HDL Ratio: 5
Triglycerides: 155 mg/dL — ABNORMAL HIGH (ref 0.0–149.0)
VLDL: 31 mg/dL (ref 0.0–40.0)

## 2011-07-25 LAB — HEPATIC FUNCTION PANEL
ALT: 31 U/L (ref 0–35)
AST: 28 U/L (ref 0–37)
Albumin: 3.5 g/dL (ref 3.5–5.2)
Alkaline Phosphatase: 67 U/L (ref 39–117)
Bilirubin, Direct: 0 mg/dL (ref 0.0–0.3)
Total Bilirubin: 0.9 mg/dL (ref 0.3–1.2)
Total Protein: 6.8 g/dL (ref 6.0–8.3)

## 2011-07-25 LAB — BRAIN NATRIURETIC PEPTIDE: Pro B Natriuretic peptide (BNP): 80 pg/mL (ref 0.0–100.0)

## 2011-07-25 NOTE — Progress Notes (Signed)
PCP: Dr. Nolon Rod  66 yo with history of CAD s/p CABG, bioprosthetic AVR, CKD, paroxysmal atrial fibrillation presents for cardiology followup.  She has been seen by Dr. Doreatha Lew in the past and is seen by me for the first time today.  She had her initial CABG in 1998, then had redo CABG in 2009 with bioprosthetic AVR.  She does not remember when she last had documented atrial fibrillation, but she thinks it was several years.  No tachypalpitations.  She has been on aspirin and dronedarone, no coumadin.  She has been stable symptomatically.  No chest pain.  No dyspnea walking on flat ground.  She is short of breath when she walks up 2 flights of steps.  No orthopnea or PND.  She has Lasix for prn use but says she has not used it for a couple of months.    ECG: NSR, diffuse T wave flattening.   PMH: 1. Diabetes mellitus 2. HTN 3. Obesity 4. Paroxysmal atrial fibrillation on dronedarone.  She has a history of amiodarone toxicity. She has not been on coumadin.  5. CAD: CABG 1998 with LIMA-LAD/diagonal, sequential SVG-OM1/OM2, SVG-RCA.  Redo CABG 2009 with SVG-OM3, SVG-PDA.  6. Bioprosthetic AVR in 2009 7. CKD 8. Echo (2011) with EF 50-55%, bioprosthetic aortic valve well-seated.  9. Asthma: since her teens. 10. Low back pain.   SH: Never smoked.  Lives in [place].  Married.    FH: No premature CAD.   ROS: All systems reviewed and negative except as per HPI.   Current Outpatient Prescriptions  Medication Sig Dispense Refill  . ACCU-CHEK AVIVA PLUS test strip As directed      . albuterol (PROVENTIL,VENTOLIN) 90 MCG/ACT inhaler Inhale 2 puffs into the lungs every 6 (six) hours as needed.        Marland Kitchen amLODipine (NORVASC) 10 MG tablet TAKE ONE TABLET BY MOUTH EVERY DAY  30 tablet  4  . aspirin 325 MG tablet Take 325 mg by mouth daily.        Marland Kitchen atorvastatin (LIPITOR) 40 MG tablet TAKE ONE TABLET BY MOUTH EVERY DAY  30 tablet  5  . budesonide-formoterol (SYMBICORT) 160-4.5 MCG/ACT inhaler Inhale  2 puffs into the lungs 2 (two) times daily.  1 Inhaler  6  . citalopram (CELEXA) 20 MG tablet TAKE ONE TABLET BY MOUTH EVERY DAY  30 tablet  6  . dronedarone (MULTAQ) 400 MG tablet Take 1 tablet (400 mg total) by mouth 2 (two) times daily with a meal.  60 tablet  6  . furosemide (LASIX) 80 MG tablet Take 80 mg by mouth as needed.      Marland Kitchen HUMALOG KWIKPEN 100 UNIT/ML injection With meals      . LANTUS SOLOSTAR 100 UNIT/ML injection Inject 60 Units as directed Every morning.      Marland Kitchen lisinopril (PRINIVIL,ZESTRIL) 2.5 MG tablet Take 1 tablet by mouth Daily.      Marland Kitchen loratadine (CLARITIN) 10 MG tablet TAKE ONE TABLET BY MOUTH EVERY DAY  30 tablet  11  . traMADol (ULTRAM) 50 MG tablet Take 1 tablet by mouth as needed.      Marland Kitchen azelastine (ASTELIN) 137 MCG/SPRAY nasal spray Place 2 sprays into the nose 3 (three) times daily as needed for rhinitis. Use in each nostril as directed  30 mL  1  . nebivolol (BYSTOLIC) 5 MG tablet Take 1 tablet (5 mg total) by mouth daily.  30 tablet  6    BP 126/81  Pulse 91  Ht 5\' 4"  (1.626 m) General: NAD Neck: Thick neck, no JVD, no thyromegaly or thyroid nodule.  Lungs: Clear to auscultation bilaterally with normal respiratory effort. CV: Nondisplaced PMI.  Heart regular S1/S2, no S3/S4, 1/6 SEM RUSB.  1+ edema 1/2 up lower legs bilaterally.  No carotid bruit.  Normal pedal pulses.  Abdomen: Soft, nontender, no hepatosplenomegaly, no distention.  Neurologic: Alert and oriented x 3.  Psych: Normal affect. Extremities: No clubbing or cyanosis.

## 2011-07-25 NOTE — Assessment & Plan Note (Addendum)
Check lipids/LFTs, goal LDL < 70.

## 2011-07-25 NOTE — Assessment & Plan Note (Signed)
Will get echo to reassess bioprosthetic aortic valve.

## 2011-07-25 NOTE — Assessment & Plan Note (Signed)
Diastolic CHF.  NYHA class II symptoms with no evidence for volume overload.  She has not been using Lasix.  Will check BNP.  In absence of overt CHF, ok to use dronedarone.

## 2011-07-25 NOTE — Patient Instructions (Signed)
Your physician recommends that you have  lab work today---lipid profile/BMET/BNP/Liver profile   Your physician has requested that you have an echocardiogram. Echocardiography is a painless test that uses sound waves to create images of your heart. It provides your doctor with information about the size and shape of your heart and how well your heart's chambers and valves are working. This procedure takes approximately one hour. There are no restrictions for this procedure.  Your physician recommends that you schedule a follow-up appointment in: 4 months with Dr Aundra Dubin.  Be sure to take Multaq (dronedarone)  twice a day with food.

## 2011-07-25 NOTE — Assessment & Plan Note (Signed)
Patient has not been on anticoagulation.  She says it has been several years since she has had documented atrial fibrillation and has had no tachypalpitations.  She is in NSR today.  She will continue dronedarone (no overt CHF).  She is on aspirin.  If she has documented atrial fibrillation again, she will need to start anticoagulation.

## 2011-07-25 NOTE — Assessment & Plan Note (Signed)
Stable with no ischemic symptoms.  Continue ASA, statin, ACEI, beta blocker.

## 2011-07-26 LAB — LDL CHOLESTEROL, DIRECT: Direct LDL: 145.9 mg/dL

## 2011-07-30 ENCOUNTER — Telehealth: Payer: Self-pay | Admitting: *Deleted

## 2011-07-30 DIAGNOSIS — E785 Hyperlipidemia, unspecified: Secondary | ICD-10-CM

## 2011-07-30 DIAGNOSIS — I25810 Atherosclerosis of coronary artery bypass graft(s) without angina pectoris: Secondary | ICD-10-CM

## 2011-07-30 MED ORDER — ROSUVASTATIN CALCIUM 20 MG PO TABS: 20.0000 mg | ORAL_TABLET | Freq: Every day | ORAL | Status: DC

## 2011-07-30 NOTE — Telephone Encounter (Signed)
Notes Recorded by Katrine Coho, RN on 07/30/2011 at 4:08 PM Discussed with pt. She will stop Lipitor and start Crestor 20mg  daily. She will return for fasting lipid/liver profile 09/24/11. We discussed low K diet also. ------  Notes Recorded by Katrine Coho, RN on 07/30/2011 at 3:56 PM LMTCB ------  Notes Recorded by Loralie Champagne, MD on 07/27/2011 at 11:41 PM LDL is too high. She is on Lipitor 40 but I wonder if she is actually taking it. If she is truly taking Lipitor 40, would switch to Crestor 20 with lipids/LFTs in 2 months. Also needs to follow low K diet.

## 2011-08-03 ENCOUNTER — Telehealth: Payer: Self-pay | Admitting: Emergency Medicine

## 2011-08-03 ENCOUNTER — Other Ambulatory Visit: Payer: Self-pay

## 2011-08-03 ENCOUNTER — Ambulatory Visit (HOSPITAL_COMMUNITY): Payer: Medicare Other | Attending: Cardiology

## 2011-08-03 DIAGNOSIS — I079 Rheumatic tricuspid valve disease, unspecified: Secondary | ICD-10-CM | POA: Insufficient documentation

## 2011-08-03 DIAGNOSIS — I359 Nonrheumatic aortic valve disorder, unspecified: Secondary | ICD-10-CM

## 2011-08-03 DIAGNOSIS — I2510 Atherosclerotic heart disease of native coronary artery without angina pectoris: Secondary | ICD-10-CM | POA: Insufficient documentation

## 2011-08-03 DIAGNOSIS — I4891 Unspecified atrial fibrillation: Secondary | ICD-10-CM | POA: Insufficient documentation

## 2011-08-03 DIAGNOSIS — R0989 Other specified symptoms and signs involving the circulatory and respiratory systems: Secondary | ICD-10-CM | POA: Insufficient documentation

## 2011-08-03 DIAGNOSIS — E119 Type 2 diabetes mellitus without complications: Secondary | ICD-10-CM | POA: Insufficient documentation

## 2011-08-03 DIAGNOSIS — R0609 Other forms of dyspnea: Secondary | ICD-10-CM | POA: Insufficient documentation

## 2011-08-03 DIAGNOSIS — I080 Rheumatic disorders of both mitral and aortic valves: Secondary | ICD-10-CM | POA: Insufficient documentation

## 2011-08-03 DIAGNOSIS — Z954 Presence of other heart-valve replacement: Secondary | ICD-10-CM | POA: Insufficient documentation

## 2011-08-03 DIAGNOSIS — I25810 Atherosclerosis of coronary artery bypass graft(s) without angina pectoris: Secondary | ICD-10-CM

## 2011-08-03 NOTE — Telephone Encounter (Signed)
I spoke with pt and advised her will leave 1 upfront for p/u. She voiced her understanding and had no questions

## 2011-08-10 ENCOUNTER — Encounter: Payer: Self-pay | Admitting: Adult Health

## 2011-08-10 ENCOUNTER — Ambulatory Visit (INDEPENDENT_AMBULATORY_CARE_PROVIDER_SITE_OTHER): Payer: Medicare Other | Admitting: Adult Health

## 2011-08-10 ENCOUNTER — Telehealth: Payer: Self-pay | Admitting: Adult Health

## 2011-08-10 DIAGNOSIS — J45909 Unspecified asthma, uncomplicated: Secondary | ICD-10-CM

## 2011-08-10 MED ORDER — LEVOFLOXACIN 500 MG PO TABS: 500.0000 mg | ORAL_TABLET | Freq: Every day | ORAL | Status: DC

## 2011-08-10 MED ORDER — HYDROCOD POLST-CPM POLST ER 10-8 MG PO CP12: 1.0000 | ORAL_CAPSULE | Freq: Two times a day (BID) | ORAL | Status: DC | PRN

## 2011-08-10 MED ORDER — PREDNISONE 10 MG PO TABS: ORAL_TABLET | ORAL | Status: DC

## 2011-08-10 MED ORDER — AZITHROMYCIN 250 MG PO TABS: ORAL_TABLET | ORAL | Status: AC

## 2011-08-10 NOTE — Progress Notes (Signed)
  Subjective:    Patient ID: Hannah [name], female    DOB: 12/15/1945, 66 y.o.   MRN: [id number]  HPI 66 yo female complains of increased SOB, wheezing, head congestion, prod cough with brown/green mucus x1week . Seen by PCP 2.25.13 and was given zpak and tessalon-does not feel much better.  OTC are not helping. Cough is getting worse.      Review of Systems Constitutional:   No  weight loss, night sweats,  Fevers, chills, fatigue, or  lassitude.  HEENT:   No headaches,  Difficulty swallowing,  Tooth/dental problems, or  Sore throat,                No sneezing, itching, ear ache, nasal congestion, post nasal drip,   CV:  No chest pain,  Orthopnea, PND, swelling in lower extremities, anasarca, dizziness, palpitations, syncope.   GI  No heartburn, indigestion, abdominal pain, nausea, vomiting, diarrhea, change in bowel habits, loss of appetite, bloody stools.   Resp: No shortness of breath with exertion or at rest.  No excess mucus, no productive cough,  No non-productive cough,  No coughing up of blood.  No change in color of mucus.  No wheezing.  No chest wall deformity  Skin: no rash or lesions.  GU: no dysuria, change in color of urine, no urgency or frequency.  No flank pain, no hematuria   MS:  No joint pain or swelling.  No decreased range of motion.  No back pain.  Psych:  No change in mood or affect. No depression or anxiety.  No memory loss.         Objective:   Physical Exam GEN: A/Ox3; pleasant , NAD  HEENT:  [place]/AT,  EACs-clear, TMs-wnl, NOSE-clear, THROAT-clear, no lesions, no postnasal drip or exudate noted.   NECK:  Supple w/ fair ROM; no JVD; normal carotid impulses w/o bruits; no thyromegaly or nodules palpated; no lymphadenopathy.  RESP  Coarse BS w/ few exp wheezes .no accessory muscle use, no dullness to percussion  CARD:  RRR, no m/r/g  , no peripheral edema, pulses intact, no cyanosis or clubbing.  GI:   Soft & nt; nml bowel sounds; no organomegaly  or masses detected.  Musco: Warm bil, no deformities or joint swelling noted.   Neuro: alert, no focal deficits noted.    Skin: Warm, no lesions or rashes         Assessment & Plan:

## 2011-08-10 NOTE — Telephone Encounter (Signed)
Per TP: patient has muiltple antibiotic allergies.  Can use zpak #1 take as directed, 0 refills.  Follow up as directed.  Thanks.  Called spoke with pharmacist - advised of change in rx as stated by TP above.  Pharmacist verbalized understanding.  Called left detailed message on pt's machine informing her of the change in her abx, to follow up as directed and to call if symptoms do not improve or worsen.    Nothing further needed at this time.  Will sign off.

## 2011-08-10 NOTE — Patient Instructions (Signed)
Levaquin 500mg  daily for 7 days  Prednisone taper over next week.  Mucinex DM Twice daily  As needed  Cough/congestion Tussicap 1 .Twice daily  As needed  Cough - may make you sleepy.  Fluids and rest  Please contact office for sooner follow up if symptoms do not improve or worsen or seek emergency care  follow up Dr. Lamonte Sakai as planned and As needed   Monitor blood sugars closely and call if remain >250. Use your sliding scale as indicated.

## 2011-08-17 NOTE — Assessment & Plan Note (Signed)
Asthmatic Bronchitis exacerbation   Plan:  Levaquin 500mg  daily for 7 days  Prednisone taper over next week.  Mucinex DM Twice daily  As needed  Cough/congestion Tussicap 1 .Twice daily  As needed  Cough - may make you sleepy.  Fluids and rest  Please contact office for sooner follow up if symptoms do not improve or worsen or seek emergency care  follow up Dr. Lamonte Sakai as planned and As needed   Monitor blood sugars closely and call if remain >250. Use your sliding scale as indicated.

## 2011-09-14 ENCOUNTER — Encounter: Payer: Self-pay | Admitting: Cardiology

## 2011-09-24 ENCOUNTER — Other Ambulatory Visit (INDEPENDENT_AMBULATORY_CARE_PROVIDER_SITE_OTHER): Payer: Medicare Other

## 2011-09-24 DIAGNOSIS — E785 Hyperlipidemia, unspecified: Secondary | ICD-10-CM

## 2011-09-24 DIAGNOSIS — I25810 Atherosclerosis of coronary artery bypass graft(s) without angina pectoris: Secondary | ICD-10-CM

## 2011-09-24 LAB — LIPID PANEL
Cholesterol: 169 mg/dL (ref 0–200)
HDL: 39.7 mg/dL (ref >39.00)
LDL Cholesterol: 105 mg/dL — ABNORMAL HIGH (ref 0–99)
Total CHOL/HDL Ratio: 4
Triglycerides: 120 mg/dL (ref 0.0–149.0)
VLDL: 24 mg/dL (ref 0.0–40.0)

## 2011-09-24 LAB — HEPATIC FUNCTION PANEL
ALT: 18 U/L (ref 0–35)
AST: 20 U/L (ref 0–37)
Albumin: 3.4 g/dL — ABNORMAL LOW (ref 3.5–5.2)
Alkaline Phosphatase: 54 U/L (ref 39–117)
Bilirubin, Direct: 0.1 mg/dL (ref 0.0–0.3)
Total Bilirubin: 0.5 mg/dL (ref 0.3–1.2)
Total Protein: 6.4 g/dL (ref 6.0–8.3)

## 2011-09-27 ENCOUNTER — Other Ambulatory Visit: Payer: Self-pay | Admitting: *Deleted

## 2011-09-27 DIAGNOSIS — E785 Hyperlipidemia, unspecified: Secondary | ICD-10-CM

## 2011-09-27 MED ORDER — ROSUVASTATIN CALCIUM 40 MG PO TABS: 40.0000 mg | ORAL_TABLET | Freq: Every day | ORAL | Status: DC

## 2011-10-15 ENCOUNTER — Other Ambulatory Visit: Payer: Self-pay | Admitting: Cardiology

## 2011-10-23 ENCOUNTER — Other Ambulatory Visit: Payer: Self-pay | Admitting: Emergency Medicine

## 2011-10-23 ENCOUNTER — Other Ambulatory Visit: Payer: Self-pay | Admitting: Nurse Practitioner

## 2011-10-24 ENCOUNTER — Other Ambulatory Visit: Payer: Self-pay | Admitting: Nurse Practitioner

## 2011-10-24 DIAGNOSIS — F329 Major depressive disorder, single episode, unspecified: Secondary | ICD-10-CM

## 2011-10-24 DIAGNOSIS — F32A Depression, unspecified: Secondary | ICD-10-CM

## 2011-10-25 ENCOUNTER — Other Ambulatory Visit: Payer: Self-pay | Admitting: Cardiology

## 2011-10-25 ENCOUNTER — Telehealth: Payer: Self-pay | Admitting: Cardiology

## 2011-10-25 DIAGNOSIS — F329 Major depressive disorder, single episode, unspecified: Secondary | ICD-10-CM

## 2011-10-25 DIAGNOSIS — F32A Depression, unspecified: Secondary | ICD-10-CM

## 2011-10-25 NOTE — Telephone Encounter (Signed)
Medication needs to be refilled by pcp

## 2011-10-25 NOTE — Telephone Encounter (Signed)
Error

## 2011-10-25 NOTE — Telephone Encounter (Signed)
Refill - Citalopram (CELEXA) 20 MG tablet  Verified preferred as [facility], patient contact # [phone].  Patient will be going out of town and needs this medication refilled ASAP.

## 2011-10-26 MED ORDER — CITALOPRAM HYDROBROMIDE 20 MG PO TABS: 20.0000 mg | ORAL_TABLET | Freq: Every day | ORAL | Status: DC

## 2011-10-26 NOTE — Telephone Encounter (Signed)
Refill- Celexa  Verified preferred [facility] [address].

## 2011-10-26 NOTE — Telephone Encounter (Signed)
Needs to be refilled by pcp

## 2011-11-13 ENCOUNTER — Other Ambulatory Visit: Payer: Self-pay | Admitting: Nurse Practitioner

## 2011-11-14 NOTE — Telephone Encounter (Signed)
Refilled amlodipine 

## 2011-11-22 ENCOUNTER — Other Ambulatory Visit (INDEPENDENT_AMBULATORY_CARE_PROVIDER_SITE_OTHER): Payer: Medicare Other

## 2011-11-22 DIAGNOSIS — E785 Hyperlipidemia, unspecified: Secondary | ICD-10-CM

## 2011-11-22 LAB — LIPID PANEL
Cholesterol: 135 mg/dL (ref 0–200)
HDL: 41.3 mg/dL (ref >39.00)
LDL Cholesterol: 76 mg/dL (ref 0–99)
Total CHOL/HDL Ratio: 3
Triglycerides: 87 mg/dL (ref 0.0–149.0)
VLDL: 17.4 mg/dL (ref 0.0–40.0)

## 2011-11-22 LAB — HEPATIC FUNCTION PANEL
ALT: 18 U/L (ref 0–35)
AST: 24 U/L (ref 0–37)
Albumin: 3.1 g/dL — ABNORMAL LOW (ref 3.5–5.2)
Alkaline Phosphatase: 57 U/L (ref 39–117)
Bilirubin, Direct: 0.1 mg/dL (ref 0.0–0.3)
Total Bilirubin: 0.9 mg/dL (ref 0.3–1.2)
Total Protein: 6.3 g/dL (ref 6.0–8.3)

## 2011-11-30 ENCOUNTER — Encounter: Payer: Self-pay | Admitting: Cardiology

## 2011-11-30 ENCOUNTER — Ambulatory Visit (INDEPENDENT_AMBULATORY_CARE_PROVIDER_SITE_OTHER): Payer: Medicare Other | Admitting: Cardiology

## 2011-11-30 VITALS — BP 122/60 | HR 84 | Ht 64.0 in | Wt 226.0 lb

## 2011-11-30 DIAGNOSIS — E785 Hyperlipidemia, unspecified: Secondary | ICD-10-CM

## 2011-11-30 DIAGNOSIS — I4891 Unspecified atrial fibrillation: Secondary | ICD-10-CM

## 2011-11-30 DIAGNOSIS — I509 Heart failure, unspecified: Secondary | ICD-10-CM

## 2011-11-30 DIAGNOSIS — I359 Nonrheumatic aortic valve disorder, unspecified: Secondary | ICD-10-CM

## 2011-11-30 DIAGNOSIS — I2510 Atherosclerotic heart disease of native coronary artery without angina pectoris: Secondary | ICD-10-CM

## 2011-11-30 LAB — BASIC METABOLIC PANEL
BUN: 24 mg/dL — ABNORMAL HIGH (ref 6–23)
CO2: 30 mEq/L (ref 19–32)
Calcium: 9.1 mg/dL (ref 8.4–10.5)
Chloride: 102 mEq/L (ref 96–112)
Creatinine, Ser: 1.4 mg/dL — ABNORMAL HIGH (ref 0.4–1.2)
GFR: 39.96 mL/min — ABNORMAL LOW (ref >60.00)
Glucose, Bld: 211 mg/dL — ABNORMAL HIGH (ref 70–99)
Potassium: 4.6 mEq/L (ref 3.5–5.1)
Sodium: 138 mEq/L (ref 135–145)

## 2011-11-30 NOTE — Patient Instructions (Addendum)
Your physician recommends that you have lab work today--BMET 424.1  Your physician wants you to follow-up in: 6 months with Dr Aundra Dubin. (December 2013). You will receive a reminder letter in the mail two months in advance. If you don't receive a letter, please call our office to schedule the follow-up appointment.

## 2011-12-02 NOTE — Assessment & Plan Note (Signed)
Stable with no ischemic symptoms.  Continue ASA, statin, ACEI, beta blocker.

## 2011-12-02 NOTE — Assessment & Plan Note (Signed)
Lipids this month near goal (< 70).

## 2011-12-02 NOTE — Assessment & Plan Note (Signed)
Possible diastolic CHF.  She has some lower extremity edema but it resolves by morning and her neck veins are not elevated.  I asked her to try to wear her compression stockings.  I do not think that she needs to take her Lasix more than she is taking it currently.

## 2011-12-02 NOTE — Assessment & Plan Note (Signed)
Patient has not been on anticoagulation.  She says it has been several years since she has had documented atrial fibrillation and has had no tachypalpitations.  She is in NSR today.  She will continue dronedarone (no overt CHF).  QT interval is ok on ECG.  She is on aspirin.  If she has documented atrial fibrillation again, she will need to start anticoagulation.

## 2011-12-02 NOTE — Progress Notes (Signed)
Patient ID: Hannah [name], female   DOB: [birth year], 66 y.o.   MRN: [id number] PCP: Dr. Nolon Rod  66 yo with history of CAD s/p CABG, bioprosthetic AVR, CKD, paroxysmal atrial fibrillation presents for cardiology followup.  She had her initial CABG in 1998, then had redo CABG in 2009 with bioprosthetic AVR.  She does not remember when she last had documented atrial fibrillation, but she thinks it was several years ago.  No tachypalpitations.  She has been on aspirin and dronedarone, no coumadin.  She has been stable symptomatically.  No chest pain.  No dyspnea walking on flat ground.  She is short of breath when she walks up 2 flights of steps.  No orthopnea or PND.  She takes Lasix prn 2-3 times a week when she gets leg swelling.  ECG: NSR, QT ok.   Labs (2/13): BNP 80, K 5.3, creatinine 1.1 Labs (6/13): LDL 76, HDL 41  PMH: 1. Diabetes mellitus 2. HTN 3. Obesity 4. Paroxysmal atrial fibrillation on dronedarone.  She has a history of amiodarone toxicity. She has not been on coumadin.  5. CAD: CABG 1998 with LIMA-LAD/diagonal, sequential SVG-OM1/OM2, SVG-RCA.  Redo CABG 2009 with SVG-OM3, SVG-PDA.  6. Bioprosthetic AVR in 2009 7. CKD 8. Echo (2011) with EF 50-55%, bioprosthetic aortic valve well-seated.  9. Asthma: since her teens. 10. Low back pain.   SH: Never smoked.  Lives in [place].  Married.    FH: No premature CAD.    Current Outpatient Prescriptions  Medication Sig Dispense Refill  . ACCU-CHEK AVIVA PLUS test strip As directed      . albuterol (PROVENTIL) (2.5 MG/3ML) 0.083% nebulizer solution Take 2.5 mg by nebulization every 6 (six) hours as needed.      Marland Kitchen albuterol (PROVENTIL,VENTOLIN) 90 MCG/ACT inhaler Inhale 2 puffs into the lungs every 6 (six) hours as needed.        Marland Kitchen amLODipine (NORVASC) 10 MG tablet TAKE ONE TABLET BY MOUTH EVERY DAY  30 tablet  11  . aspirin 325 MG tablet Take 325 mg by mouth daily.        . benzonatate (TESSALON) 200 MG capsule Take 200 mg by  mouth 3 (three) times daily as needed.      . budesonide-formoterol (SYMBICORT) 160-4.5 MCG/ACT inhaler Inhale 2 puffs into the lungs 2 (two) times daily.  1 Inhaler  6  . citalopram (CELEXA) 20 MG tablet Take 1 tablet (20 mg total) by mouth daily.  30 tablet  6  . furosemide (LASIX) 80 MG tablet Take 80 mg by mouth as needed.      Marland Kitchen HUMALOG KWIKPEN 100 UNIT/ML injection 30 units three times daily with meals as directed      . Hydrocod Polst-Chlorphen Polst (TUSSICAPS) 10-8 MG CP12 Take 1 capsule by mouth 2 (two) times daily as needed.  20 each  0  . LANTUS SOLOSTAR 100 UNIT/ML injection Inject 60 Units as directed Every morning.      Marland Kitchen lisinopril (PRINIVIL,ZESTRIL) 2.5 MG tablet Take 1 tablet by mouth Daily.      Marland Kitchen loratadine (CLARITIN) 10 MG tablet TAKE ONE TABLET BY MOUTH EVERY DAY  30 tablet  2  . MULTAQ 400 MG tablet TAKE ONE TABLET BY MOUTH TWICE DAILY WITH MEALS  60 tablet  6  . nebivolol (BYSTOLIC) 5 MG tablet Take 1 tablet (5 mg total) by mouth daily.  30 tablet  6  . predniSONE (DELTASONE) 10 MG tablet 4 tabs for 2 days, then 3  tabs for 2 days, 2 tabs for 2 days, then 1 tab for 2 days, then stop  20 tablet  0  . rosuvastatin (CRESTOR) 40 MG tablet Take 1 tablet (40 mg total) by mouth daily.  30 tablet  3  . traMADol (ULTRAM) 50 MG tablet Take 1 tablet by mouth as needed.        BP 122/60  Pulse 84  Ht 5\' 4"  (1.626 m)  Wt 102.513 kg (226 lb)  BMI 38.79 kg/m2  SpO2 97% General: NAD Neck: Thick neck, no JVD, no thyromegaly or thyroid nodule.  Lungs: Clear to auscultation bilaterally with normal respiratory effort. CV: Nondisplaced PMI.  Heart regular S1/S2, no S3/S4, 1/6 SEM RUSB.  2+ edema 1/3 up lower legs bilaterally.  No carotid bruit.  Normal pedal pulses.  Abdomen: Soft, nontender, no hepatosplenomegaly, no distention.  Neurologic: Alert and oriented x 3.  Psych: Normal affect. Extremities: No clubbing or cyanosis.

## 2011-12-09 IMAGING — CR DG CERVICAL SPINE COMPLETE 4+V
7 series · 7 of 7 positions shown · non-contrast
Comparison: None available.

CLINICAL DATA: Fall.  Pain in right upper arm and right side of
neck.

CERVICAL SPINE - COMPLETE 4+ VIEW

[t c-spine a.p.]
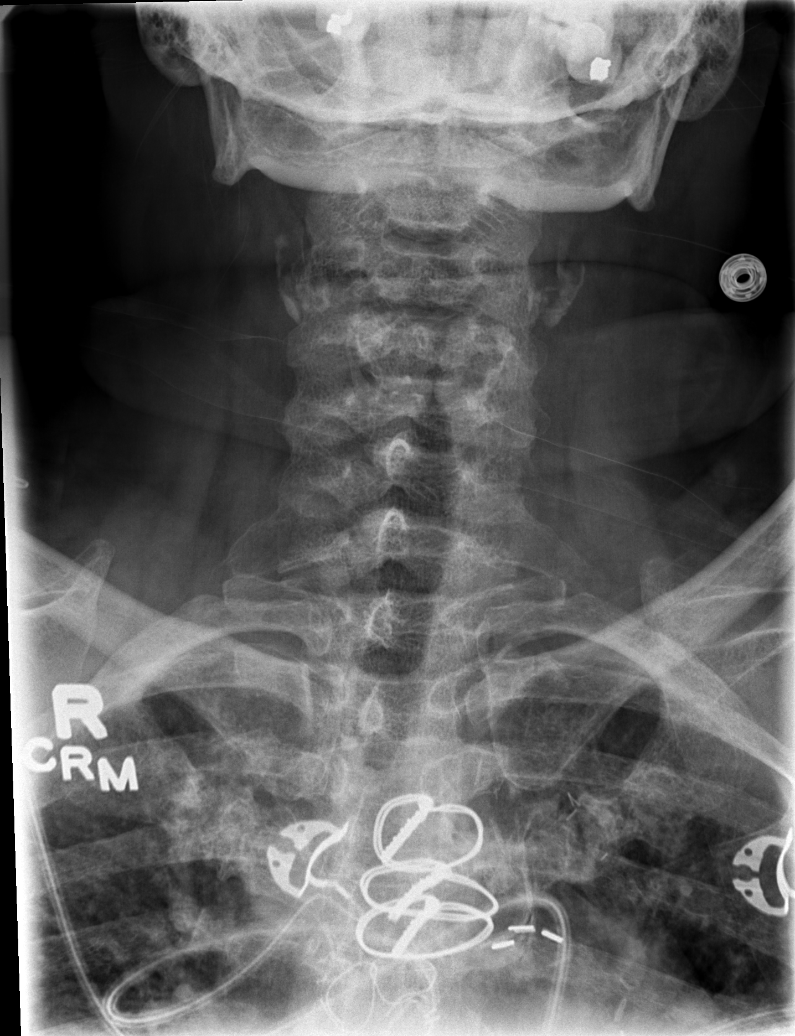

[t c-spine odontoid *]
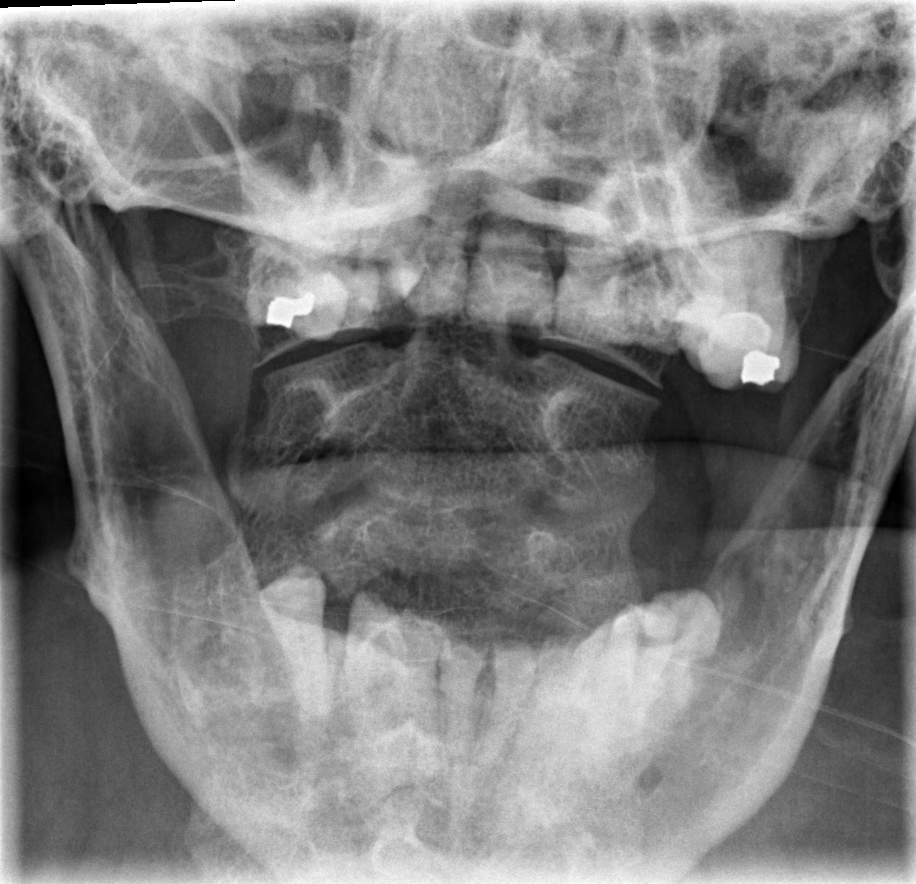

[t c-spine odontoid]
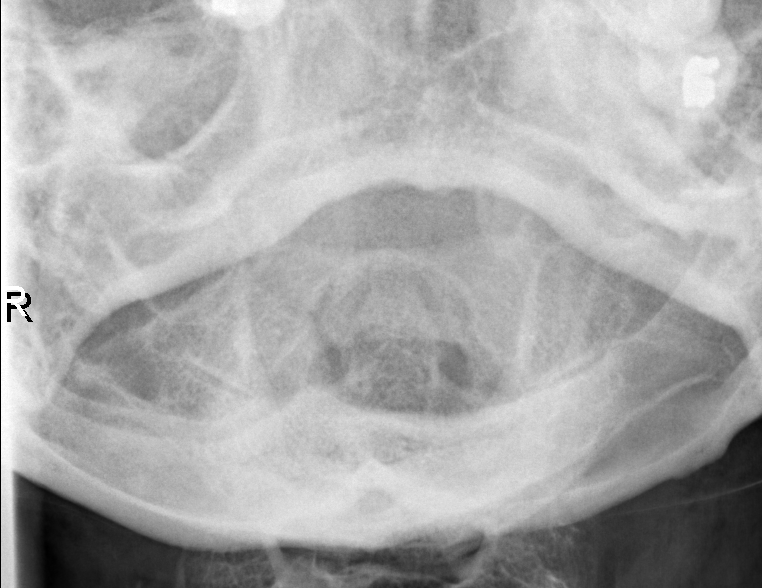

[t c-spine oblique (1 of 2)]
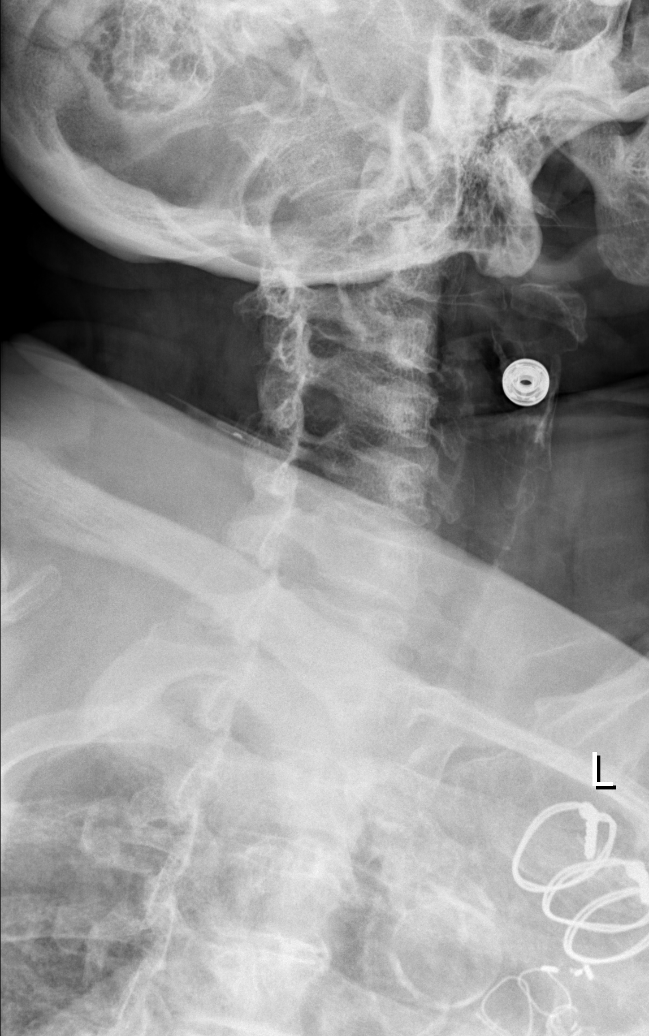

[t c-spine oblique (2 of 2)]
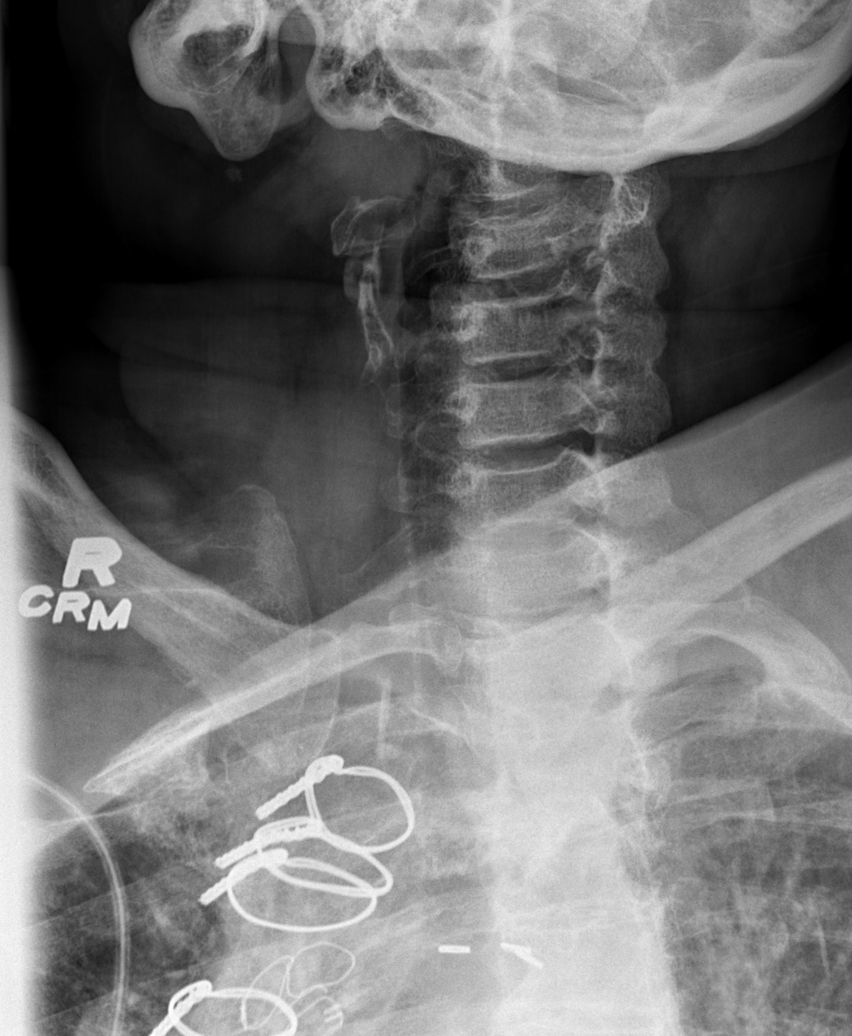

[w c-spine lat *]
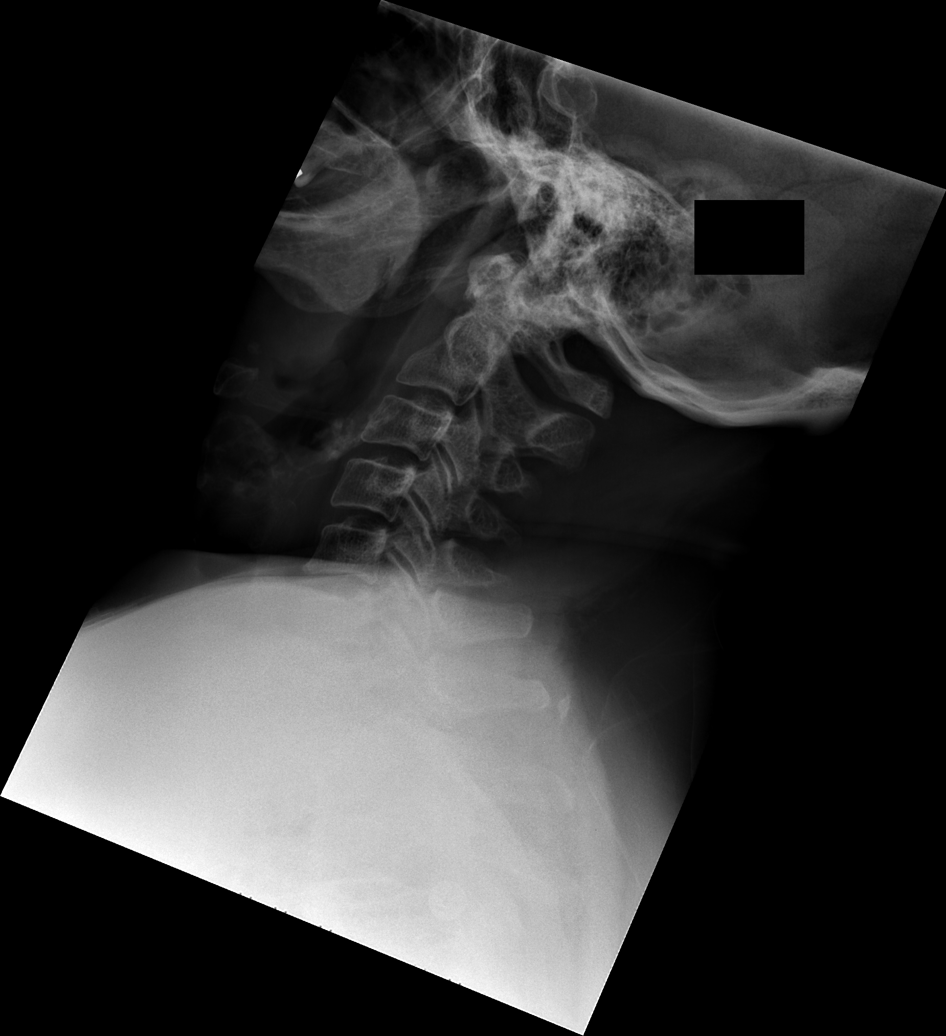

[w swimmers view]
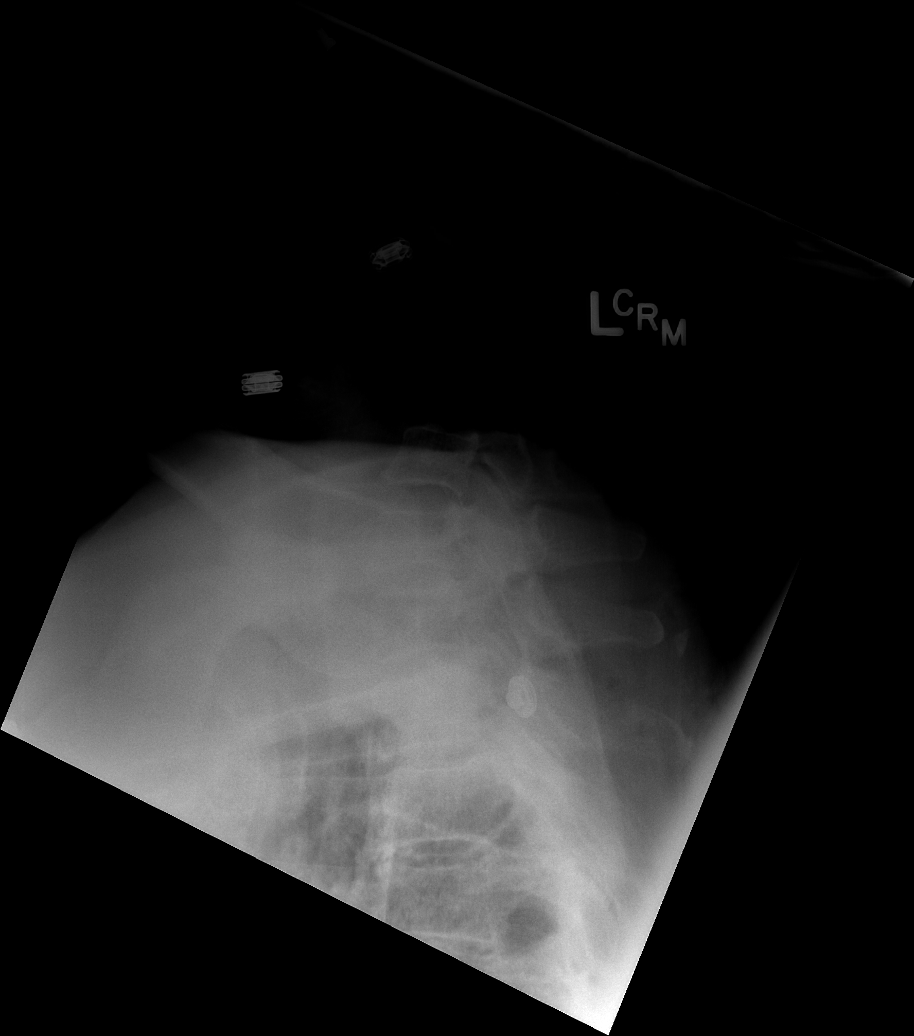

[7 of 7 positions shown; findings below may reference images not displayed]

FINDINGS: The cervical spine is visualized from skull base through
the cervicothoracic junction.  The prevertebral soft tissues are
normal.  The vertebral body heights and alignment are within normal
limits.  No acute fracture or traumatic subluxation is evident.
IMPRESSION: No acute abnormality.

## 2011-12-09 IMAGING — CT CT HEAD W/O CM
1 of 2 series · 16 of 30 positions shown, 20 images · non-contrast
Comparison: None.

CLINICAL DATA: Fall.  Syncope.  Pain.

CT HEAD WITHOUT CONTRAST
TECHNIQUE: Contiguous axial images were obtained from the base of
the skull through the vertex without contrast.

[Series 3: recon 2: brain · axial · 0.48mm/px · z∈[+111,+261]mm · 16 of 64 slices shown, 20 images]
[im 4/64  brain]
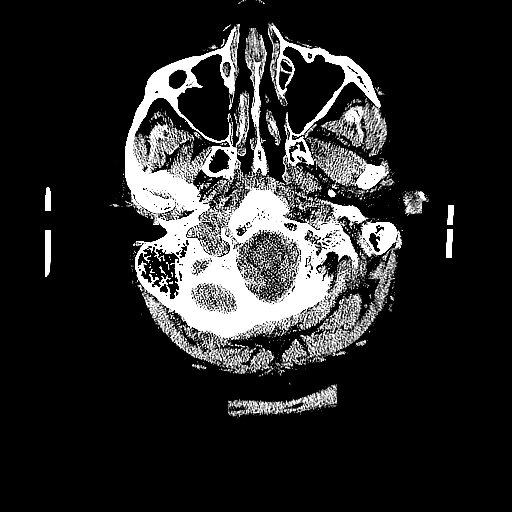
[im 4/64  bone]
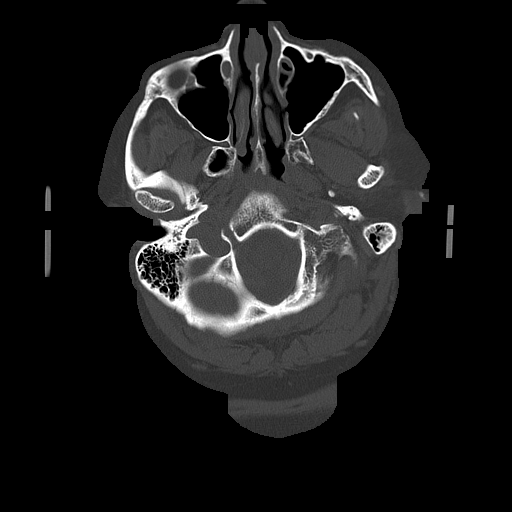
[im 7/64  brain]
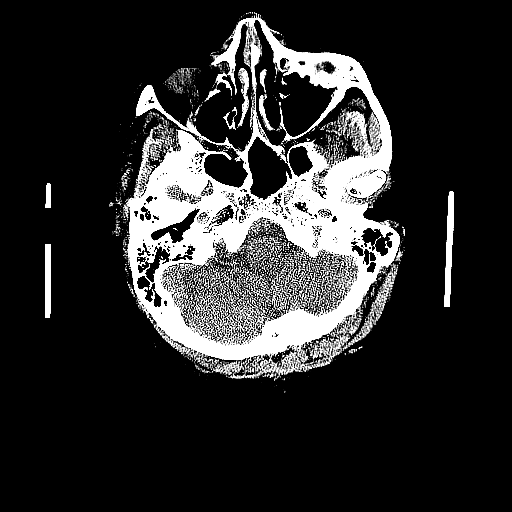
[im 10/64  brain]
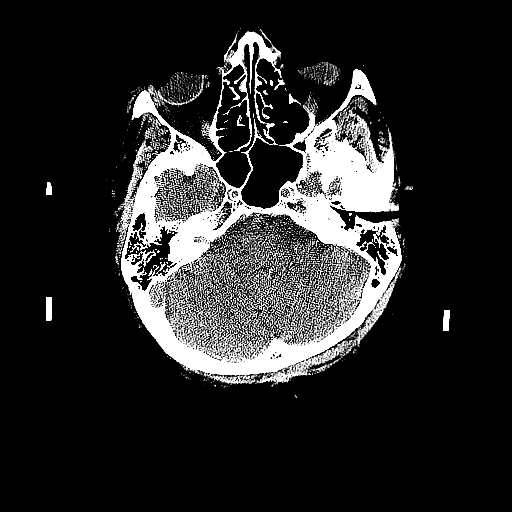
[im 14/64  brain]
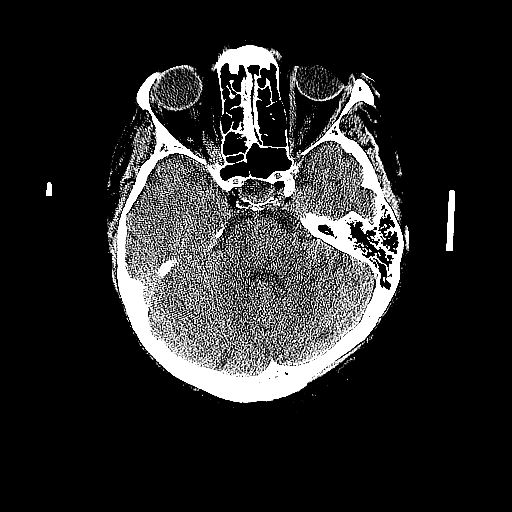
[im 20/64  brain]
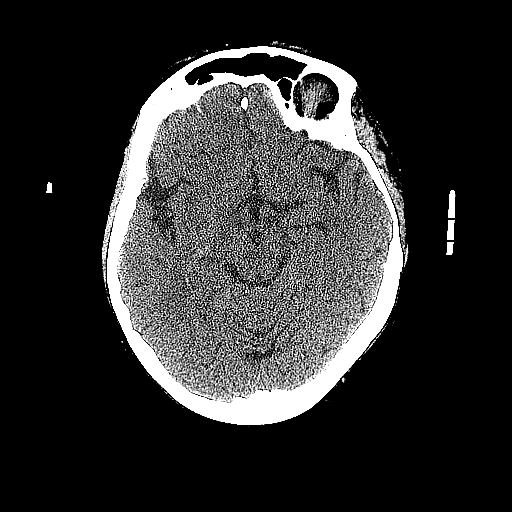
[im 20/64  bone]
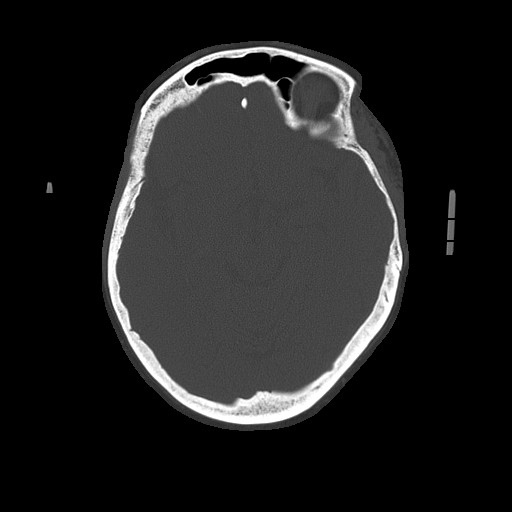
[im 24/64  brain]
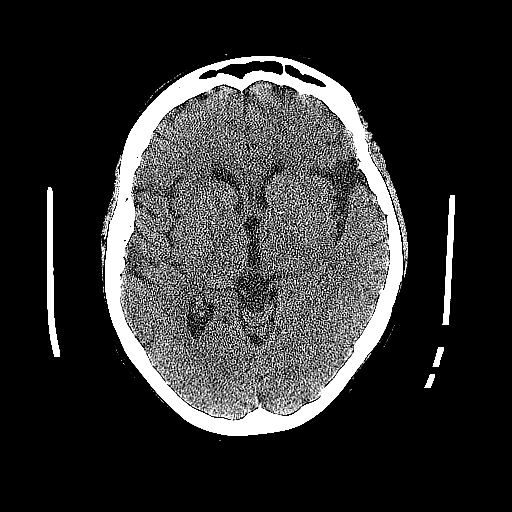
[im 27/64  brain]
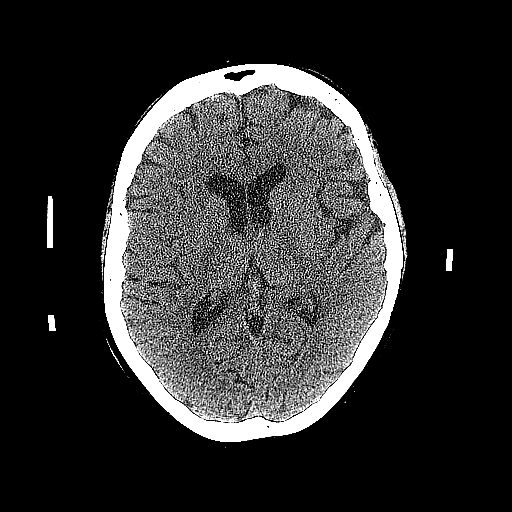
[im 30/64  brain]
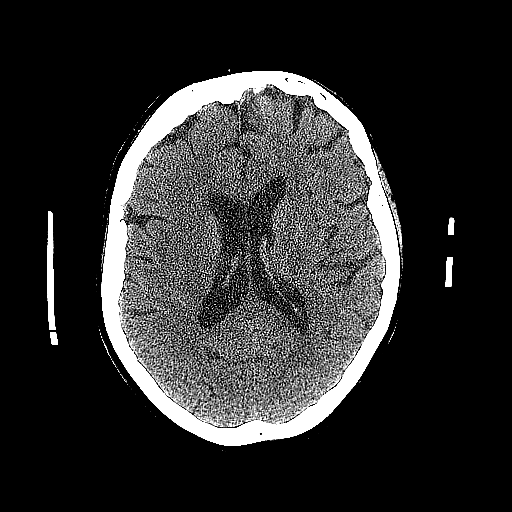
[im 34/64  brain]
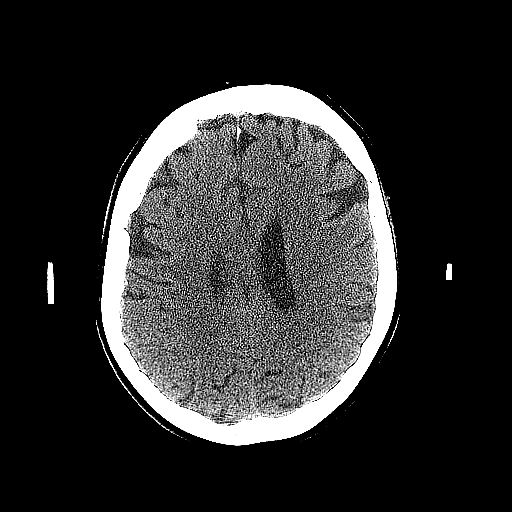
[im 34/64  bone]
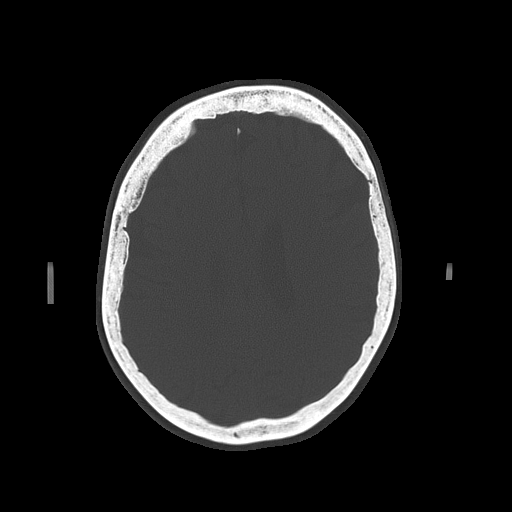
[im 37/64  brain]
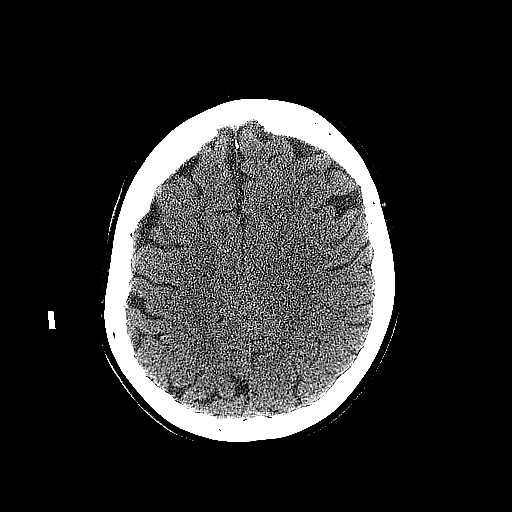
[im 40/64  brain]
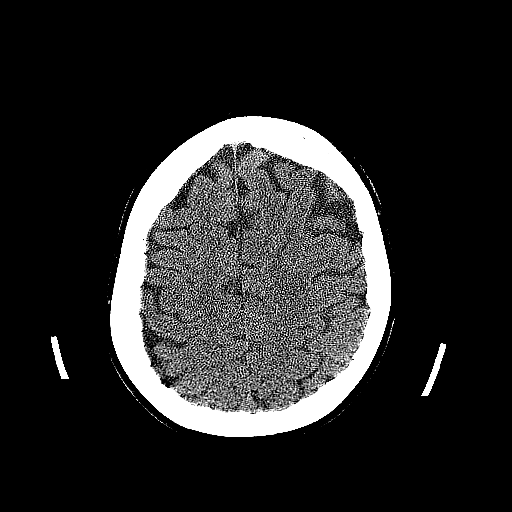
[im 44/64  brain]
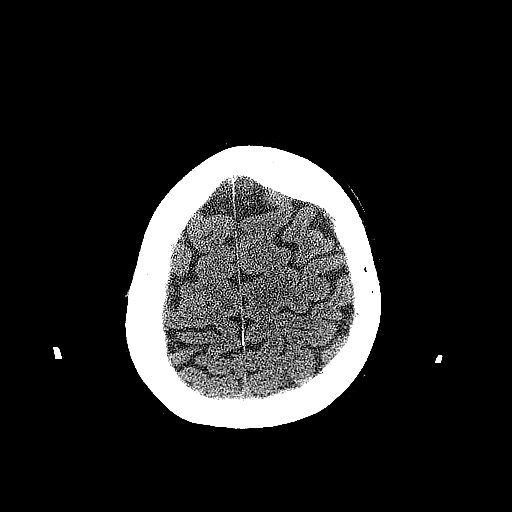
[im 50/64  brain]
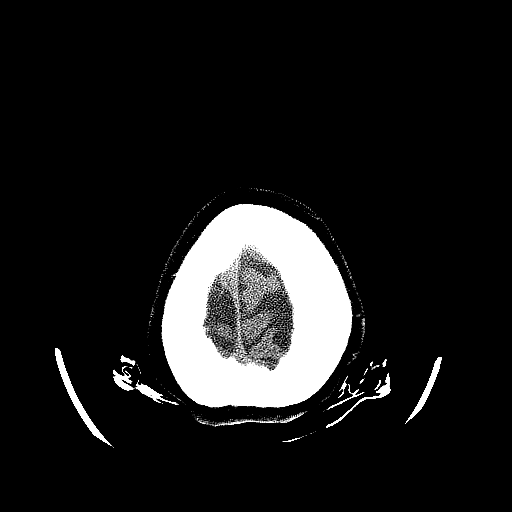
[im 50/64  bone]
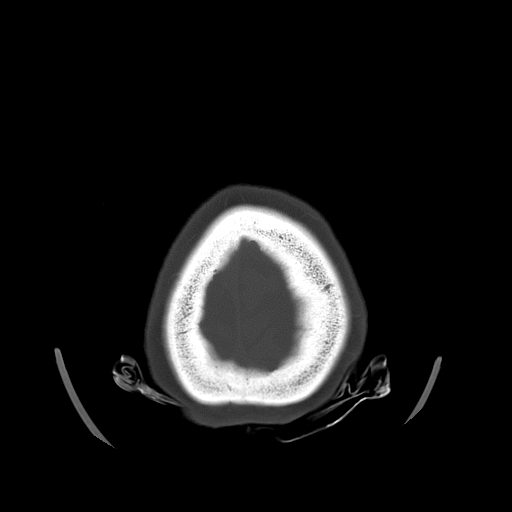
[im 54/64  brain]
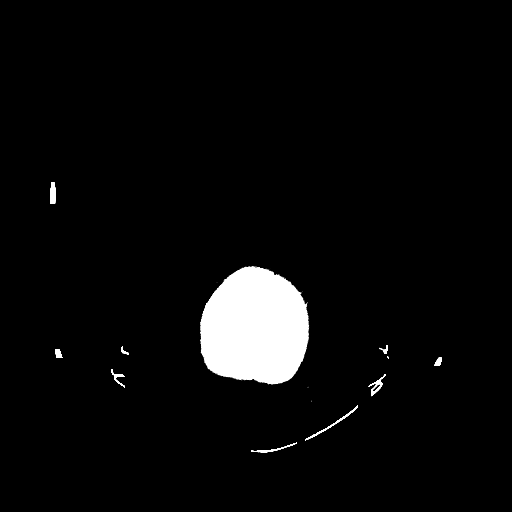
[im 57/64  brain]
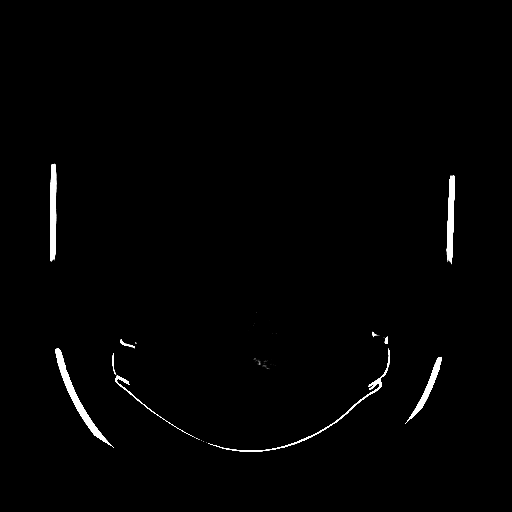
[im 60/64  brain]
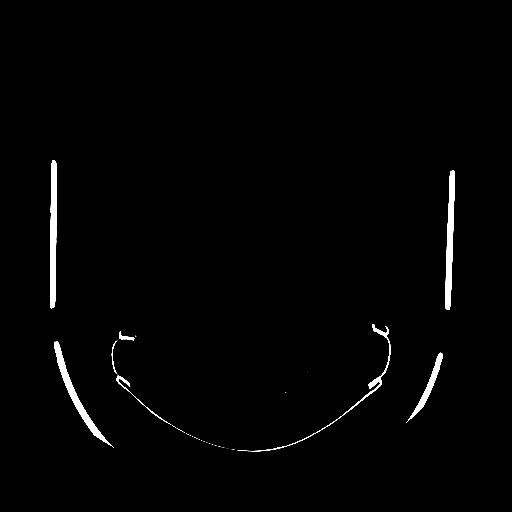

[16 of 30 positions shown; findings below may reference images not displayed]

FINDINGS: No acute cortical infarct, hemorrhage, mass lesion, or
hydrocephalus is present.  Vascular calcifications are noted within
the cavernous carotid arteries and at the dural margin of the
vertebral arteries bilaterally.  The paranasal sinuses and mastoid
air cells are clear.  The osseous skull is intact.
IMPRESSION: 1.  No acute intracranial abnormality.
2.  Atherosclerosis.

## 2011-12-09 IMAGING — CR DG HUMERUS 2V *R*
3 series · 3 of 3 positions shown · non-contrast
Comparison: None.

CLINICAL DATA: Fall.  Right upper arm pain.

RIGHT HUMERUS - 2+ VIEW

[t humerus ap right * (1 of 2)]
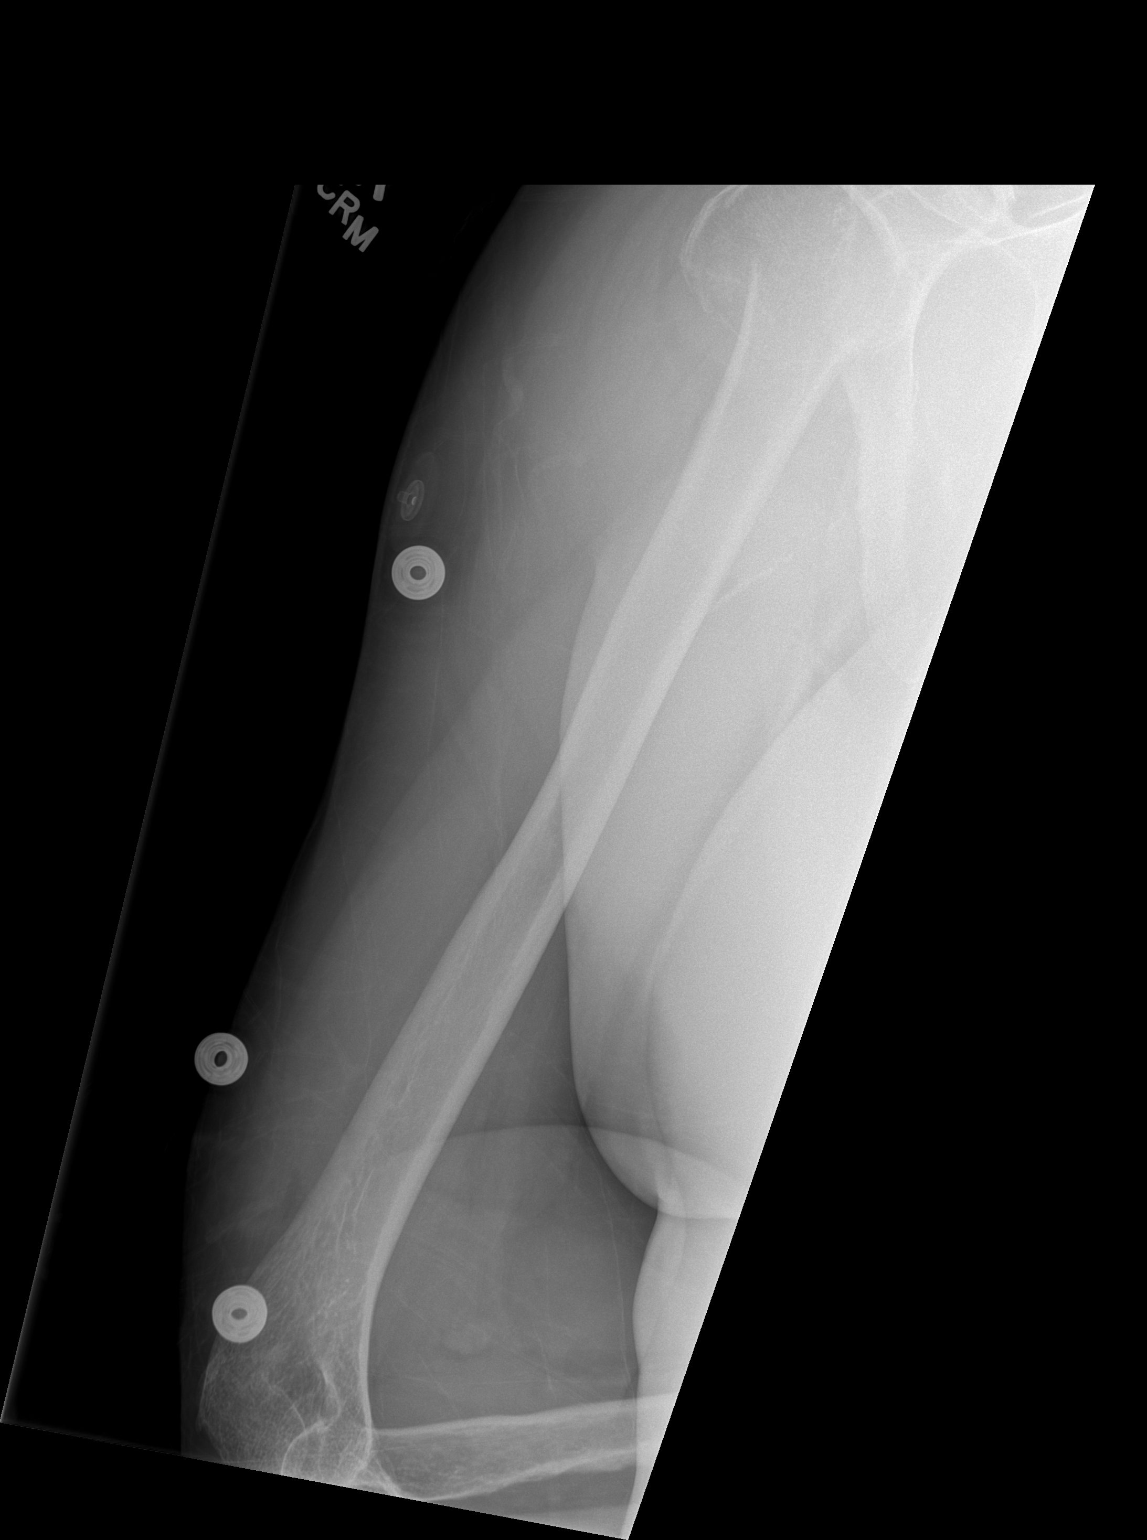

[t humerus lat right *]
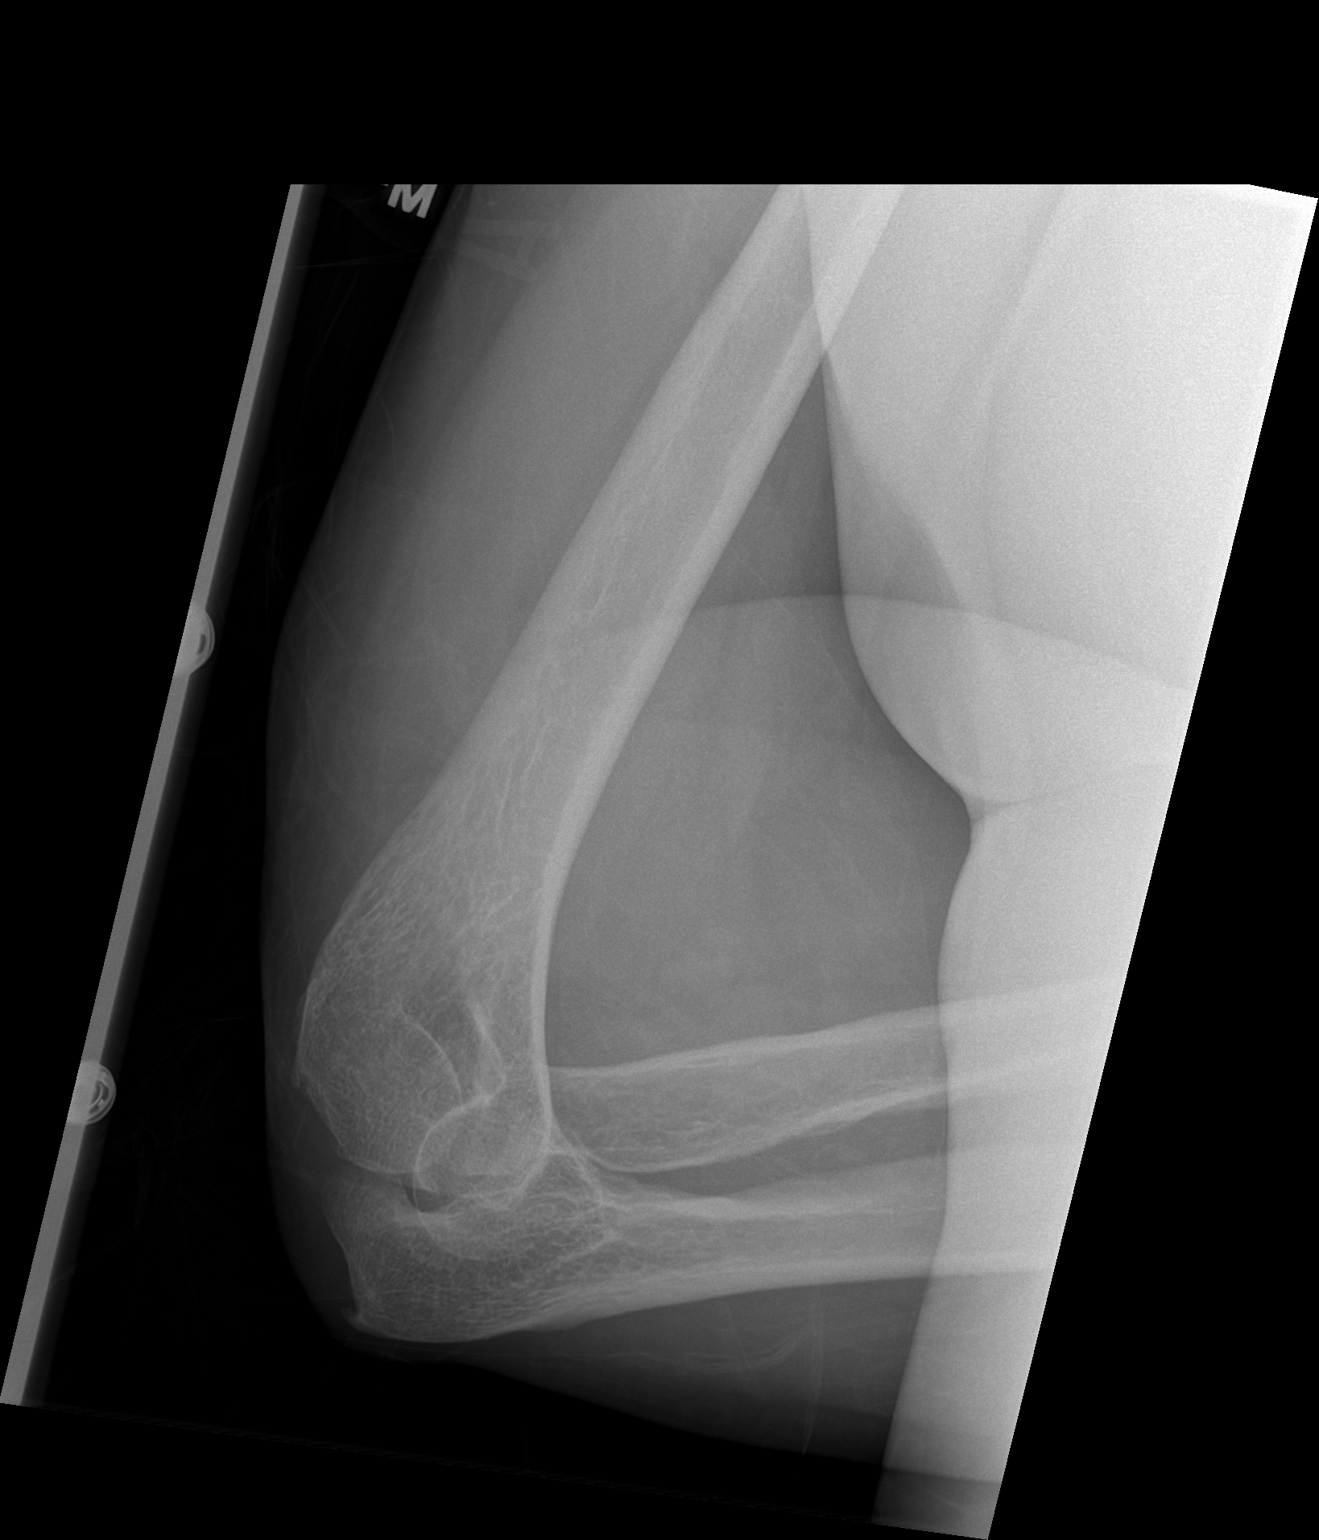

[t humerus ap right * (2 of 2)]
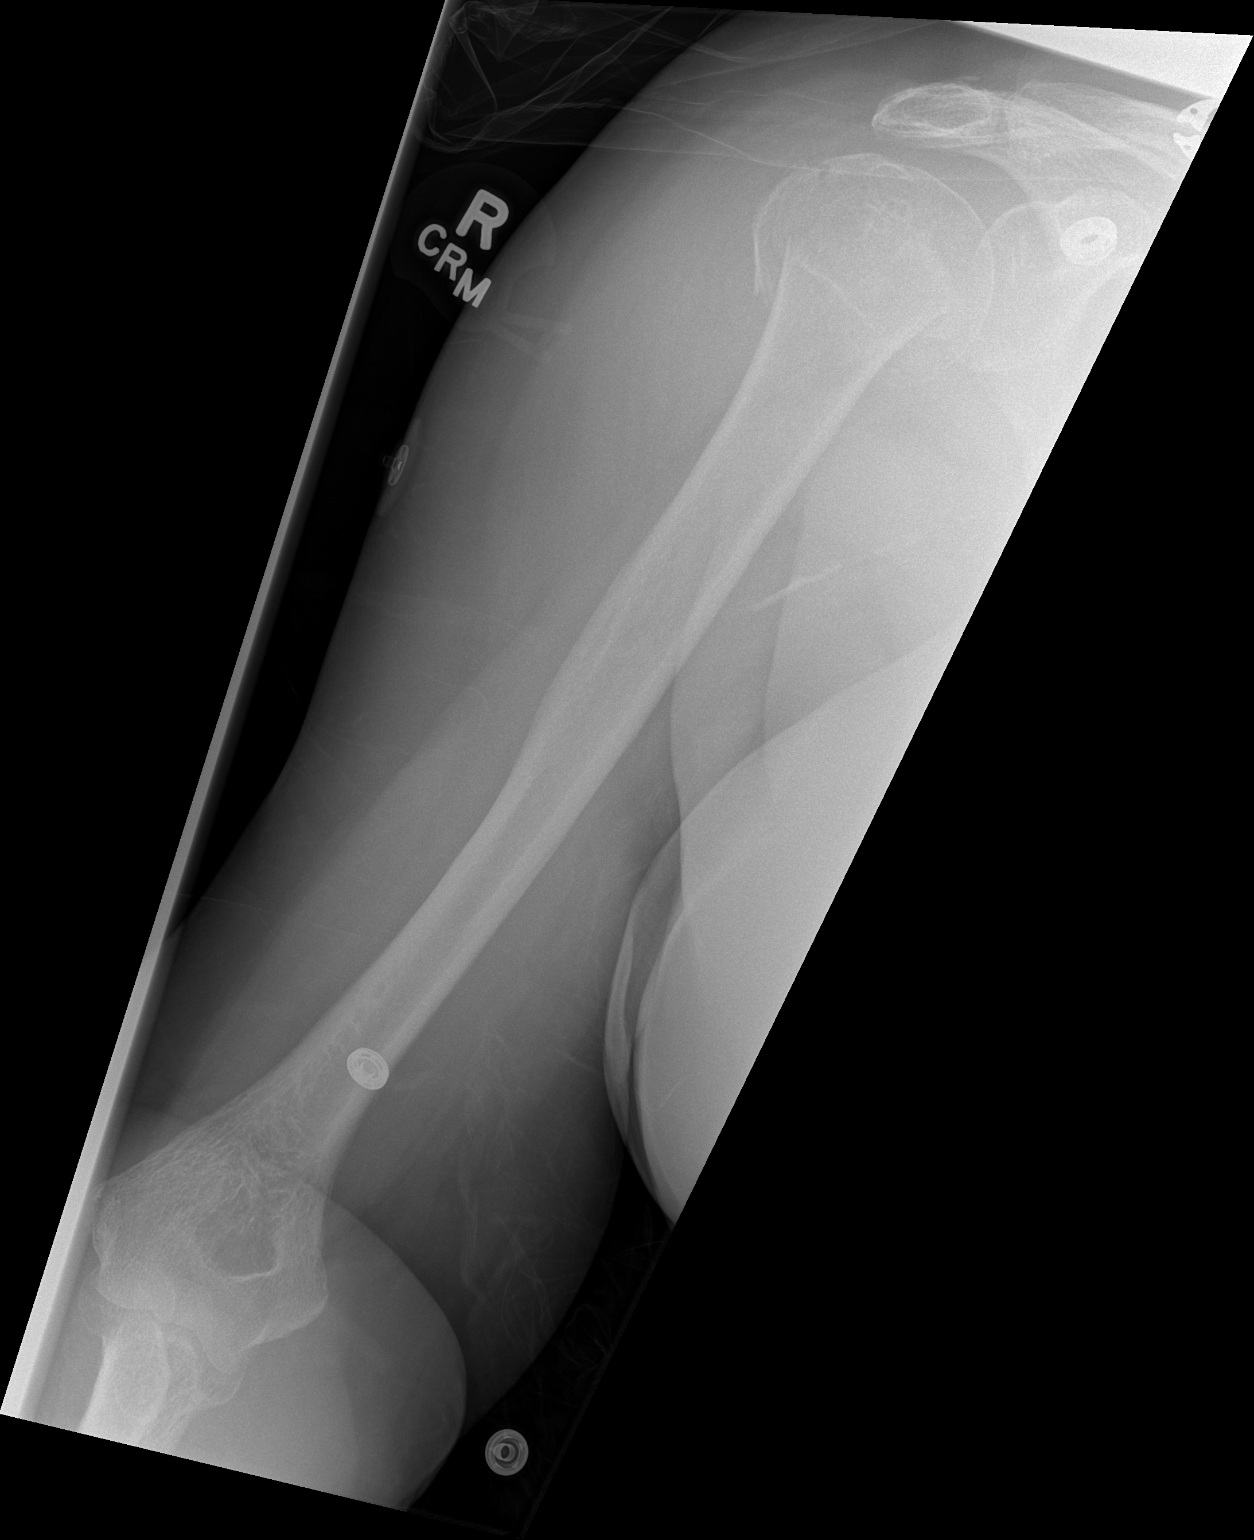

[3 of 3 positions shown; findings below may reference images not displayed]

FINDINGS: A comminuted right humeral neck fracture extends into the
humeral head.  The shoulder is located.  The distal humerus and
elbow are within normal limits.
IMPRESSION: Comminuted proximal humeral fracture extending into the humeral
head.

## 2011-12-17 ENCOUNTER — Other Ambulatory Visit: Payer: Self-pay | Admitting: Emergency Medicine

## 2011-12-23 ENCOUNTER — Encounter (HOSPITAL_BASED_OUTPATIENT_CLINIC_OR_DEPARTMENT_OTHER): Payer: Self-pay | Admitting: *Deleted

## 2011-12-23 ENCOUNTER — Emergency Department (HOSPITAL_BASED_OUTPATIENT_CLINIC_OR_DEPARTMENT_OTHER)
Admission: EM | Admit: 2011-12-23 | Discharge: 2011-12-23 | Disposition: A | Discharge status: Home / Self Care | Payer: Medicare Other | Attending: Emergency Medicine | Admitting: Emergency Medicine

## 2011-12-23 DIAGNOSIS — E785 Hyperlipidemia, unspecified: Secondary | ICD-10-CM | POA: Insufficient documentation

## 2011-12-23 DIAGNOSIS — Z8249 Family history of ischemic heart disease and other diseases of the circulatory system: Secondary | ICD-10-CM | POA: Insufficient documentation

## 2011-12-23 DIAGNOSIS — I10 Essential (primary) hypertension: Secondary | ICD-10-CM | POA: Insufficient documentation

## 2011-12-23 DIAGNOSIS — D509 Iron deficiency anemia, unspecified: Secondary | ICD-10-CM | POA: Insufficient documentation

## 2011-12-23 DIAGNOSIS — R51 Headache: Secondary | ICD-10-CM | POA: Insufficient documentation

## 2011-12-23 DIAGNOSIS — I2510 Atherosclerotic heart disease of native coronary artery without angina pectoris: Secondary | ICD-10-CM | POA: Insufficient documentation

## 2011-12-23 DIAGNOSIS — Z951 Presence of aortocoronary bypass graft: Secondary | ICD-10-CM | POA: Insufficient documentation

## 2011-12-23 DIAGNOSIS — I509 Heart failure, unspecified: Secondary | ICD-10-CM | POA: Insufficient documentation

## 2011-12-23 DIAGNOSIS — Z7982 Long term (current) use of aspirin: Secondary | ICD-10-CM | POA: Insufficient documentation

## 2011-12-23 DIAGNOSIS — E119 Type 2 diabetes mellitus without complications: Secondary | ICD-10-CM | POA: Insufficient documentation

## 2011-12-23 DIAGNOSIS — I4891 Unspecified atrial fibrillation: Secondary | ICD-10-CM | POA: Insufficient documentation

## 2011-12-23 HISTORY — DX: Unspecified hearing loss, right ear: H91.91

## 2011-12-23 LAB — BASIC METABOLIC PANEL
BUN: 21 mg/dL (ref 6–23)
CO2: 26 mEq/L (ref 19–32)
Calcium: 9.5 mg/dL (ref 8.4–10.5)
Chloride: 96 mEq/L (ref 96–112)
Creatinine, Ser: 1.1 mg/dL (ref 0.50–1.10)
GFR calc Af Amer: 59 mL/min — ABNORMAL LOW (ref >90)
GFR calc non Af Amer: 51 mL/min — ABNORMAL LOW (ref >90)
Glucose, Bld: 192 mg/dL — ABNORMAL HIGH (ref 70–99)
Potassium: 4.1 mEq/L (ref 3.5–5.1)
Sodium: 133 mEq/L — ABNORMAL LOW (ref 135–145)

## 2011-12-23 LAB — CBC
HCT: 36.5 % (ref 36.0–46.0)
Hemoglobin: 12.5 g/dL (ref 12.0–15.0)
MCH: 29.2 pg (ref 26.0–34.0)
MCHC: 34.2 g/dL (ref 30.0–36.0)
MCV: 85.3 fL (ref 78.0–100.0)
Platelets: 183 10*3/uL (ref 150–400)
RBC: 4.28 MIL/uL (ref 3.87–5.11)
RDW: 12.5 % (ref 11.5–15.5)
WBC: 8.9 10*3/uL (ref 4.0–10.5)

## 2011-12-23 MED ORDER — KETOROLAC TROMETHAMINE 30 MG/ML IJ SOLN
30.0000 mg | Freq: Once | INTRAMUSCULAR | Status: AC
  Administered 2011-12-23: 30 mg via INTRAVENOUS
  Filled 2011-12-23: qty 1

## 2011-12-23 MED ORDER — DIPHENHYDRAMINE HCL 50 MG/ML IJ SOLN
25.0000 mg | Freq: Once | INTRAMUSCULAR | Status: AC
  Administered 2011-12-23: 25 mg via INTRAVENOUS
  Filled 2011-12-23: qty 1

## 2011-12-23 MED ORDER — METOCLOPRAMIDE HCL 5 MG/ML IJ SOLN
10.0000 mg | Freq: Once | INTRAMUSCULAR | Status: AC
  Administered 2011-12-23: 10 mg via INTRAVENOUS
  Filled 2011-12-23: qty 2

## 2011-12-23 NOTE — ED Notes (Signed)
Pt states she has had a H/A, N/V since Thursday. Saw Dr. Friday and had MRI. Given Phenergan and Vicodin which have not helped. PERL.

## 2011-12-23 NOTE — ED Provider Notes (Signed)
History     CSN: [id number]  Arrival date & time 12/23/11  1735   First MD Initiated Contact with Patient 12/23/11 1848      Chief Complaint  Patient presents with  . Headache    (Consider location/radiation/quality/duration/timing/severity/associated sxs/prior treatment) Patient is a 66 y.o. female presenting with headaches. The history is provided by the patient. No language interpreter was used.  Headache  This is a new problem. The problem occurs constantly. The problem has not changed since onset.The headache is associated with nothing. The quality of the pain is described as sharp. The pain is at a severity of 7/10. The pain is moderate. She has tried oral narcotic analgesics for the symptoms. The treatment provided moderate relief.  Pt reports she has had a headache since Thursday.   Pt saw her MD and had an MRi.   Pt reports her MD gave her vicodin and phenergan.  Pt reports Mri was normal per PA. Pt is suppose to see her MD tomorrow.  No relief with vicodin and phenergan.  Past Medical History  Diagnosis Date  . Hypertension   . Hyperlipidemia   . Atrial fibrillation     in sinus on Multaq  . CHF (congestive heart failure)     EF is low normal at 50 to 55% per echo in Jan. 2011  . Renal insufficiency, mild   . Dyspnea     chronic; followed by Dr. Lamonte Sakai  . Nonischemic cardiomyopathy   . Anemia   . Diabetes mellitus   . Iron deficiency anemia   . High risk medication use   . CAD (coronary artery disease)     Remote CABG in 1998 with redo in 2009  . S/P AVR 2009  . Amiodarone toxicity   . Hearing loss of right ear     Past Surgical History  Procedure Date  . Coronary artery bypass graft 1998 & 2009    Had LIMA to DX/LAD, SVG to 2 marginal branches and SVG to [facility] originally; SVG to 3rd OM and PD at time of redo  . Aortic valve replacement 2009    #1mm [facility] Ease pericardial valve  . Cardiac catheterization 03/23/2008    OVERALL CARDIAC SIZE AND  SILHOUETTE WERE NORMAL. EF IS 55%    Family History  Problem Relation Age of Onset  . Heart disease Mother   . Heart disease Father     History  Substance Use Topics  . Smoking status: Never Smoker   . Smokeless tobacco: Never Used  . Alcohol Use: No    OB History    Grav Para Term Preterm Abortions TAB SAB Ect Mult Living                  Review of Systems  Neurological: Positive for headaches.  All other systems reviewed and are negative.    Allergies  Amoxicillin and Tetracycline  Home Medications   Current Outpatient Rx  Name Route Sig Dispense Refill  . AMLODIPINE BESYLATE 10 MG PO TABS Oral Take 10 mg by mouth 2 (two) times daily.    . ASPIRIN 325 MG PO TABS Oral Take 325 mg by mouth daily.      Marland Kitchen CITALOPRAM HYDROBROMIDE 20 MG PO TABS Oral Take 1 tablet (20 mg total) by mouth daily. 30 tablet 6  . FUROSEMIDE 80 MG PO TABS Oral Take 80 mg by mouth daily.     Marland Kitchen HUMALOG KWIKPEN 100 UNIT/ML [place] SOLN  30 units three  times daily with meals as directed    . HYDROCODONE-ACETAMINOPHEN 5-325 MG PO TABS Oral Take 1 tablet by mouth every 4 (four) hours as needed. For pain    . LANTUS SOLOSTAR 100 UNIT/ML [place] SOLN Injection Inject 60 Units as directed daily.     Marland Kitchen LISINOPRIL 2.5 MG PO TABS Oral Take 1 tablet by mouth Daily.    Marland Kitchen LORATADINE 10 MG PO TABS  TAKE ONE TABLET BY MOUTH EVERY DAY 30 tablet 2  . MECLIZINE HCL 25 MG PO TABS Oral Take 25 mg by mouth 3 (three) times daily as needed. For dizziness or nausea    . MULTAQ 400 MG PO TABS  TAKE ONE TABLET BY MOUTH TWICE DAILY WITH MEALS 60 tablet 6  . PROMETHAZINE HCL 25 MG PO TABS Oral Take 25 mg by mouth every 4 (four) hours as needed. For nausea    . ROSUVASTATIN CALCIUM 40 MG PO TABS Oral Take 1 tablet (40 mg total) by mouth daily. 30 tablet 3  . SYMBICORT 160-4.5 MCG/ACT IN AERO  INHALE TWO PUFFS BY MOUTH TWICE DAILY 11 g 2  . TRAMADOL HCL 50 MG PO TABS Oral Take 1 tablet by mouth every 6 (six) hours as needed. For pain      . ALBUTEROL SULFATE (2.5 MG/3ML) 0.083% IN NEBU Nebulization Take 2.5 mg by nebulization every 6 (six) hours as needed. For cough    . ALBUTEROL 90 MCG/ACT IN AERS Inhalation Inhale 2 puffs into the lungs every 6 (six) hours as needed. For shortness of breath      BP 181/67  Pulse 92  Temp 98.2 F (36.8 C) (Oral)  Resp 20  Ht 5\' 5"  (1.651 m)  Wt 220 lb (99.791 kg)  BMI 36.61 kg/m2  SpO2 97%  Physical Exam  Nursing note and vitals reviewed. Constitutional: She is oriented to person, place, and time. She appears well-developed and well-nourished.  HENT:  Head: Normocephalic and atraumatic.  Right Ear: External ear normal.  Left Ear: External ear normal.  Nose: Nose normal.  Mouth/Throat: Oropharynx is clear and moist.  Eyes: Conjunctivae and EOM are normal. Pupils are equal, round, and reactive to light.  Neck: Normal range of motion. Neck supple.  Cardiovascular: Normal rate, regular rhythm and normal heart sounds.   Pulmonary/Chest: Effort normal and breath sounds normal.  Abdominal: Soft.  Musculoskeletal: Normal range of motion.  Neurological: She is alert and oriented to person, place, and time. She has normal reflexes.  Skin: Skin is warm.  Psychiatric: She has a normal mood and affect.    ED Course  Procedures (including critical care time)  Labs Reviewed  BASIC METABOLIC PANEL - Abnormal; Notable for the following:    Sodium 133 (*)     Glucose, Bld 192 (*)     GFR calc non Af Amer 51 (*)     GFR calc Af Amer 59 (*)     All other components within normal limits  CBC   No results found.   1. Headache       MDM  Pt reports she feels better after reglan, torodol and benadryl          [place], [place] 12/23/11 2128  [place], [place] 12/23/11 2142

## 2011-12-23 NOTE — ED Notes (Signed)
Pt. Eating ice with husband at bedside.  Pt. Reports she feels very little relief after meds given to her.

## 2011-12-24 NOTE — ED Provider Notes (Signed)
Medical screening examination/treatment/procedure(s) were performed by non-physician practitioner and as supervising physician I was immediately available for consultation/collaboration.   Blair Heys, MD 12/24/11 [phone]

## 2011-12-31 ENCOUNTER — Encounter: Payer: Self-pay | Admitting: Cardiology

## 2012-01-14 ENCOUNTER — Other Ambulatory Visit: Payer: Self-pay | Admitting: Emergency Medicine

## 2012-01-27 ENCOUNTER — Other Ambulatory Visit: Payer: Self-pay | Admitting: Cardiology

## 2012-01-31 ENCOUNTER — Encounter: Payer: Self-pay | Admitting: Cardiology

## 2012-02-01 ENCOUNTER — Emergency Department (HOSPITAL_COMMUNITY): Payer: Medicare Other

## 2012-02-01 ENCOUNTER — Encounter (HOSPITAL_COMMUNITY): Payer: Self-pay

## 2012-02-01 ENCOUNTER — Emergency Department (HOSPITAL_COMMUNITY)
Admission: EM | Admit: 2012-02-01 | Discharge: 2012-02-01 | Disposition: A | Discharge status: Home / Self Care | Payer: Medicare Other | Attending: Emergency Medicine | Admitting: Emergency Medicine

## 2012-02-01 DIAGNOSIS — Z794 Long term (current) use of insulin: Secondary | ICD-10-CM | POA: Insufficient documentation

## 2012-02-01 DIAGNOSIS — K824 Cholesterolosis of gallbladder: Secondary | ICD-10-CM | POA: Insufficient documentation

## 2012-02-01 DIAGNOSIS — Z79899 Other long term (current) drug therapy: Secondary | ICD-10-CM | POA: Insufficient documentation

## 2012-02-01 DIAGNOSIS — E119 Type 2 diabetes mellitus without complications: Secondary | ICD-10-CM | POA: Insufficient documentation

## 2012-02-01 DIAGNOSIS — I2510 Atherosclerotic heart disease of native coronary artery without angina pectoris: Secondary | ICD-10-CM | POA: Insufficient documentation

## 2012-02-01 DIAGNOSIS — R1011 Right upper quadrant pain: Secondary | ICD-10-CM | POA: Insufficient documentation

## 2012-02-01 DIAGNOSIS — Z8673 Personal history of transient ischemic attack (TIA), and cerebral infarction without residual deficits: Secondary | ICD-10-CM | POA: Insufficient documentation

## 2012-02-01 DIAGNOSIS — Z951 Presence of aortocoronary bypass graft: Secondary | ICD-10-CM | POA: Insufficient documentation

## 2012-02-01 DIAGNOSIS — I10 Essential (primary) hypertension: Secondary | ICD-10-CM | POA: Insufficient documentation

## 2012-02-01 DIAGNOSIS — I4891 Unspecified atrial fibrillation: Secondary | ICD-10-CM | POA: Insufficient documentation

## 2012-02-01 DIAGNOSIS — E785 Hyperlipidemia, unspecified: Secondary | ICD-10-CM | POA: Insufficient documentation

## 2012-02-01 DIAGNOSIS — R109 Unspecified abdominal pain: Secondary | ICD-10-CM

## 2012-02-01 DIAGNOSIS — I509 Heart failure, unspecified: Secondary | ICD-10-CM | POA: Insufficient documentation

## 2012-02-01 HISTORY — DX: Cerebral infarction, unspecified: I63.9

## 2012-02-01 LAB — URINALYSIS, ROUTINE W REFLEX MICROSCOPIC
Bilirubin Urine: NEGATIVE
Glucose, UA: NEGATIVE mg/dL
Hgb urine dipstick: NEGATIVE
Ketones, ur: NEGATIVE mg/dL
Nitrite: NEGATIVE
Protein, ur: 30 mg/dL — AB
Specific Gravity, Urine: 1.014 (ref 1.005–1.030)
Urobilinogen, UA: 1 mg/dL (ref 0.0–1.0)
pH: 5.5 (ref 5.0–8.0)

## 2012-02-01 LAB — CBC WITH DIFFERENTIAL/PLATELET
Basophils Absolute: 0.1 10*3/uL (ref 0.0–0.1)
Basophils Relative: 2 % — ABNORMAL HIGH (ref 0–1)
Eosinophils Absolute: 0 10*3/uL (ref 0.0–0.7)
Eosinophils Relative: 1 % (ref 0–5)
HCT: 34.2 % — ABNORMAL LOW (ref 36.0–46.0)
Hemoglobin: 11.5 g/dL — ABNORMAL LOW (ref 12.0–15.0)
Lymphocytes Relative: 19 % (ref 12–46)
Lymphs Abs: 1 10*3/uL (ref 0.7–4.0)
MCH: 28.9 pg (ref 26.0–34.0)
MCHC: 33.6 g/dL (ref 30.0–36.0)
MCV: 85.9 fL (ref 78.0–100.0)
Monocytes Absolute: 0.6 10*3/uL (ref 0.1–1.0)
Monocytes Relative: 11 % (ref 3–12)
Neutro Abs: 3.5 10*3/uL (ref 1.7–7.7)
Neutrophils Relative %: 68 % (ref 43–77)
Platelets: 208 10*3/uL (ref 150–400)
RBC: 3.98 MIL/uL (ref 3.87–5.11)
RDW: 12.8 % (ref 11.5–15.5)
WBC: 5.2 10*3/uL (ref 4.0–10.5)

## 2012-02-01 LAB — COMPREHENSIVE METABOLIC PANEL
ALT: 17 U/L (ref 0–35)
AST: 16 U/L (ref 0–37)
Albumin: 3.5 g/dL (ref 3.5–5.2)
Alkaline Phosphatase: 61 U/L (ref 39–117)
BUN: 15 mg/dL (ref 6–23)
CO2: 27 mEq/L (ref 19–32)
Calcium: 9.9 mg/dL (ref 8.4–10.5)
Chloride: 97 mEq/L (ref 96–112)
Creatinine, Ser: 1.38 mg/dL — ABNORMAL HIGH (ref 0.50–1.10)
GFR calc Af Amer: 45 mL/min — ABNORMAL LOW (ref >90)
GFR calc non Af Amer: 39 mL/min — ABNORMAL LOW (ref >90)
Glucose, Bld: 135 mg/dL — ABNORMAL HIGH (ref 70–99)
Potassium: 4.3 mEq/L (ref 3.5–5.1)
Sodium: 134 mEq/L — ABNORMAL LOW (ref 135–145)
Total Bilirubin: 1 mg/dL (ref 0.3–1.2)
Total Protein: 6.9 g/dL (ref 6.0–8.3)

## 2012-02-01 LAB — LIPASE, BLOOD: Lipase: 34 U/L (ref 11–59)

## 2012-02-01 LAB — URINE MICROSCOPIC-ADD ON

## 2012-02-01 MED ORDER — HYDROMORPHONE HCL PF 1 MG/ML IJ SOLN
1.0000 mg | Freq: Once | INTRAMUSCULAR | Status: AC
  Administered 2012-02-01: 1 mg via INTRAVENOUS

## 2012-02-01 MED ORDER — ONDANSETRON HCL 4 MG/2ML IJ SOLN
4.0000 mg | Freq: Once | INTRAMUSCULAR | Status: AC
  Administered 2012-02-01: 4 mg via INTRAVENOUS

## 2012-02-01 MED ORDER — SODIUM CHLORIDE 0.9 % IV SOLN: 1000.0000 mL | INTRAVENOUS | Status: DC

## 2012-02-01 MED ORDER — HYDROMORPHONE HCL PF 1 MG/ML IJ SOLN
1.0000 mg | Freq: Once | INTRAMUSCULAR | Status: DC
  Filled 2012-02-01: qty 1

## 2012-02-01 MED ORDER — OXYCODONE-ACETAMINOPHEN 5-325 MG PO TABS: 1.0000 | ORAL_TABLET | ORAL | Status: AC | PRN

## 2012-02-01 MED ORDER — ONDANSETRON HCL 4 MG/2ML IJ SOLN
4.0000 mg | Freq: Once | INTRAMUSCULAR | Status: DC
  Filled 2012-02-01: qty 2

## 2012-02-01 MED ORDER — SODIUM CHLORIDE 0.9 % IV SOLN
1000.0000 mL | Freq: Once | INTRAVENOUS | Status: AC
  Administered 2012-02-01: 1000 mL via INTRAVENOUS

## 2012-02-01 NOTE — ED Notes (Signed)
Pt reports onging R flank and abdominal pain x 2 days, states has not been able to drink or eat for days due to nausea and pain.

## 2012-02-01 NOTE — ED Provider Notes (Signed)
History     CSN: [id number]  Arrival date & time 02/01/12  1144   First MD Initiated Contact with Patient 02/01/12 1502      Chief Complaint  Patient presents with  . Abdominal Pain     The history is provided by the patient.   patient reports developing right-sided abdominal pain for the past 2 days.  She's had some nausea and vomiting.  She denies diarrhea.  She reports her pain is located in her right upper abdomen.  She denies fevers or chills.  She denies hematemesis.  She has no melena or hematochezia.  Her pain radiates to her right upper back.  She has had some urinary frequency but denies dysuria  Past Medical History  Diagnosis Date  . Hypertension   . Hyperlipidemia   . Atrial fibrillation     in sinus on Multaq  . CHF (congestive heart failure)     EF is low normal at 50 to 55% per echo in Jan. 2011  . Renal insufficiency, mild   . Dyspnea     chronic; followed by Dr. Lamonte Sakai  . Nonischemic cardiomyopathy   . Anemia   . Diabetes mellitus   . Iron deficiency anemia   . High risk medication use   . CAD (coronary artery disease)     Remote CABG in 1998 with redo in 2009  . S/P AVR 2009  . Amiodarone toxicity   . Hearing loss of right ear   . Stroke     Past Surgical History  Procedure Date  . Coronary artery bypass graft 1998 & 2009    Had LIMA to DX/LAD, SVG to 2 marginal branches and SVG to [facility] originally; SVG to 3rd OM and PD at time of redo  . Aortic valve replacement 2009    #75mm [facility] Ease pericardial valve  . Cardiac catheterization 03/23/2008    OVERALL CARDIAC SIZE AND SILHOUETTE WERE NORMAL. EF IS 55%    Family History  Problem Relation Age of Onset  . Heart disease Mother   . Heart disease Father   . Heart failure Father     History  Substance Use Topics  . Smoking status: Never Smoker   . Smokeless tobacco: Never Used  . Alcohol Use: No    OB History    Grav Para Term Preterm Abortions TAB SAB Ect Mult Living                  Review of Systems  All other systems reviewed and are negative.    Allergies  Amoxicillin and Tetracycline  Home Medications   Current Outpatient Rx  Name Route Sig Dispense Refill  . ALBUTEROL SULFATE (2.5 MG/3ML) 0.083% IN NEBU Nebulization Take 2.5 mg by nebulization every 6 (six) hours as needed. For cough    . ALBUTEROL 90 MCG/ACT IN AERS Inhalation Inhale 2 puffs into the lungs every 6 (six) hours as needed. For shortness of breath    . CITALOPRAM HYDROBROMIDE 20 MG PO TABS Oral Take 1 tablet (20 mg total) by mouth daily. 30 tablet 6  . CLOPIDOGREL BISULFATE 75 MG PO TABS Oral Take 75 mg by mouth daily.    Marland Kitchen DRONEDARONE HCL 400 MG PO TABS Oral Take 400 mg by mouth 2 (two) times daily with a meal.    . INSULIN GLARGINE 100 UNIT/ML [place] SOLN Subcutaneous Inject 60 Units into the skin daily.    . INSULIN LISPRO (HUMAN) 100 UNIT/ML [place] SOLN Subcutaneous Inject  30 Units into the skin 3 (three) times daily before meals.    Marland Kitchen LISINOPRIL 2.5 MG PO TABS Oral Take 1 tablet by mouth Daily.    Marland Kitchen LORATADINE 10 MG PO TABS Oral Take 10 mg by mouth daily as needed. For allergies.    . ONDANSETRON HCL 4 MG PO TABS Oral Take 4 mg by mouth every 8 (eight) hours as needed. For nausea.    . OXYCODONE HCL 20 MG PO TABS Oral Take 1 tablet by mouth every 8 (eight) hours as needed. For pain.    Marland Kitchen ROSUVASTATIN CALCIUM 40 MG PO TABS Oral Take 40 mg by mouth daily.    . SYMBICORT 160-4.5 MCG/ACT IN AERO  INHALE TWO PUFFS BY MOUTH TWICE DAILY 11 g 2    BP 169/67  Pulse 92  Temp 98.7 F (37.1 C) (Oral)  Resp 18  SpO2 100%  Physical Exam  Nursing note and vitals reviewed. Constitutional: She is oriented to person, place, and time. She appears well-developed and well-nourished. No distress.  HENT:  Head: Normocephalic and atraumatic.  Eyes: EOM are normal.  Neck: Normal range of motion.  Cardiovascular: Normal rate, regular rhythm and normal heart sounds.   Pulmonary/Chest: Effort normal and  breath sounds normal.  Abdominal: Soft. She exhibits no distension.       Epigastric and RUQ abdominal tenderness  Musculoskeletal: Normal range of motion.  Neurological: She is alert and oriented to person, place, and time.  Skin: Skin is warm and dry.  Psychiatric: She has a normal mood and affect. Judgment normal.    ED Course  Procedures (including critical care time)  Labs Reviewed  CBC WITH DIFFERENTIAL - Abnormal; Notable for the following:    Hemoglobin 11.5 (*)     HCT 34.2 (*)     Basophils Relative 2 (*)     All other components within normal limits  COMPREHENSIVE METABOLIC PANEL - Abnormal; Notable for the following:    Sodium 134 (*)     Glucose, Bld 135 (*)     Creatinine, Ser 1.38 (*)     GFR calc non Af Amer 39 (*)     GFR calc Af Amer 45 (*)     All other components within normal limits  URINALYSIS, ROUTINE W REFLEX MICROSCOPIC - Abnormal; Notable for the following:    APPearance CLOUDY (*)     Protein, ur 30 (*)     Leukocytes, UA TRACE (*)     All other components within normal limits  URINE MICROSCOPIC-ADD ON - Abnormal; Notable for the following:    Squamous Epithelial / LPF FEW (*)     All other components within normal limits  LIPASE, BLOOD  LAB REPORT - SCANNED   US Abdomen Complete  02/01/2012  *RADIOLOGY REPORT*  Clinical Data:  Abdominal pain.  COMPLETE ABDOMINAL ULTRASOUND  Comparison:  None.  Findings:  Gallbladder:  No gallstones, gallbladder wall thickening, or pericholecystic fluid. Several gallbladder polyps are noted.  Common bile duct:  Measures 6.7 mm.  Liver:  No focal lesion identified.  Within normal limits in parenchymal echogenicity.  IVC:  Appears normal.  Pancreas:  No focal abnormality seen.  Spleen:  Measures 6.9 cm.  Right Kidney:  Measures 9.3 cm.  Left Kidney:  Measures9.4 cm.  Abdominal aorta:  No aneurysm identified.  IMPRESSION: Gallbladder polyps.  No other significant abnormality seen in the abdomen.   Original Report  Authenticated By: Dalene Carrow., M.D.  I personally reviewed the imaging tests through PACS system  I reviewed available ER/hospitalization records thought the EMR   1. Abdominal pain       MDM  Patient is well-appearing.  She feels much better at this time.  Discharge home with close PCP followup.         Hoy Morn, MD 02/03/12 [phone]

## 2012-02-01 NOTE — ED Notes (Signed)
Pt refused offer of pain and nausea medication stating "not right this second".

## 2012-02-01 NOTE — ED Notes (Signed)
Patient reports that she has had right flank pain, nausea, vomiting , and urinary frequency since Wednesday. Patient denies fever or diarrhea.

## 2012-02-20 ENCOUNTER — Encounter: Payer: Self-pay | Admitting: Cardiology

## 2012-02-22 ENCOUNTER — Ambulatory Visit (INDEPENDENT_AMBULATORY_CARE_PROVIDER_SITE_OTHER): Payer: Medicare Other | Admitting: Emergency Medicine

## 2012-02-22 ENCOUNTER — Encounter: Payer: Self-pay | Admitting: Emergency Medicine

## 2012-02-22 VITALS — BP 90/50 | HR 102 | Temp 98.1°F | Ht 64.5 in | Wt 196.4 lb

## 2012-02-22 DIAGNOSIS — J45909 Unspecified asthma, uncomplicated: Secondary | ICD-10-CM

## 2012-02-22 MED ORDER — LORATADINE 10 MG PO TABS: 10.0000 mg | ORAL_TABLET | Freq: Every day | ORAL | Status: DC | PRN

## 2012-02-22 NOTE — Assessment & Plan Note (Signed)
Will continue current symbicort and loratadine Walking oximetry today to insure that her episodes of imbalance are not related to hypoxemia.

## 2012-02-22 NOTE — Progress Notes (Signed)
  Subjective:    Patient ID: Hannah [name], female    DOB: 1945-07-19, 66 y.o.   MRN: [id number]  HPI 66 yo never smoker, hx of CAD/CABG, aortic valve replacement, A Fib. Also with hx of childhood asthma. She was diagnosed with adult asthma in her 10's in setting wheeze, SOB, cough that wouldn't go away. Was on prn SABA for a while, started on Symbicort early 2009. Had PFT's done in August 2009 and 2010 that showed mixed disease. In 10/10 FEV1 1.01L. Averages Prednisone about 3 - 4x a year. Never hospitalized or on MV due to breathing. Amiodarone stopped late 2010.   ROV 10/14/09 -- returns for f/u of her asthma, restrictive disease. Tells me that she has had PND, allergy symptoms this spring, making her mor SOB and giving her more cough.   ROV 03/07/10 -- Hx CAD/CABG, asthma and allergies. Still with some nasal congestion, doing loratadine and fluticasone spray, rarely [place]. Does some throat clearing. Taking her symbicort reliably. Uses ventolin a couple times a month.   ROV 10/04/10 -- Hx CAD/CABG, asthma and allergies. Has been having more trouble with nasal gtt and allergies and therefore more cough chest tightness. Using Symbicort. Has needed ventolin about 4x a day for last week. More SOB. Can't tolerate the NSWs.   ROV 04/24/11 -- Hx CAD/CABG, asthma and allergies. Here for follow up. She was treated over phone for bronchitis after URI - better after doxy and prednisone. Still on Symbicort bid. She is much improved, back to baseline. She has had the flu shot. She is rarely using SABA - was requiring frequently when she was exacerbated. She is having several AE a year.   ROV 02/22/12 -- Hx CAD/CABG, asthma and allergies. She is undergoing eval for HA, nausea, imbalance. May have had a small CVA - had MRI and neuro eval. Sometimes these episodes are associated with SOB. She remains on Symbicort.  She is on loratadine, uses nasal steroid prn.    Review of Systems as per HPI    Objective:   Physical Exam Filed Vitals:   02/22/12 1424  BP: 90/50  Pulse: 102  Temp: 98.1 F (36.7 C)   Gen: Pleasant, obese, in no distress,  normal affect  ENT: No lesions,  mouth clear,  oropharynx clear, no postnasal drip  Neck: No JVD, no TMG, no carotid bruits  Lungs: No use of accessory muscles, no dullness to percussion, clear without rales or rhonchi  Cardiovascular: RRR, heart sounds normal, no murmur or gallops, no peripheral edema  Musculoskeletal: No deformities, no cyanosis or clubbing  Neuro: alert, non focal  Skin: Warm, no lesions or rashes   Assessment & Plan:  ASTHMA Will continue current symbicort and loratadine Walking oximetry today to insure that her episodes of imbalance are not related to hypoxemia.

## 2012-02-22 NOTE — Patient Instructions (Addendum)
Continue your Symbicort and your loratadine as you are taking them Walking oximetry today does not show any evidence that your imbalance or headaches are related to your asthma or your oxygen level.  Follow with Dr Lamonte Sakai in 6 months or sooner if you have any problems

## 2012-02-28 ENCOUNTER — Ambulatory Visit (INDEPENDENT_AMBULATORY_CARE_PROVIDER_SITE_OTHER): Payer: Medicare Other | Admitting: Cardiology

## 2012-02-28 ENCOUNTER — Encounter: Payer: Self-pay | Admitting: Cardiology

## 2012-02-28 VITALS — BP 130/68 | HR 99 | Resp 18 | Ht 64.0 in | Wt 200.0 lb

## 2012-02-28 DIAGNOSIS — I10 Essential (primary) hypertension: Secondary | ICD-10-CM

## 2012-02-28 DIAGNOSIS — E785 Hyperlipidemia, unspecified: Secondary | ICD-10-CM

## 2012-02-28 DIAGNOSIS — I4891 Unspecified atrial fibrillation: Secondary | ICD-10-CM

## 2012-02-28 DIAGNOSIS — I359 Nonrheumatic aortic valve disorder, unspecified: Secondary | ICD-10-CM

## 2012-02-28 DIAGNOSIS — Z954 Presence of other heart-valve replacement: Secondary | ICD-10-CM

## 2012-02-28 DIAGNOSIS — R0602 Shortness of breath: Secondary | ICD-10-CM

## 2012-02-28 DIAGNOSIS — Z952 Presence of prosthetic heart valve: Secondary | ICD-10-CM

## 2012-02-28 DIAGNOSIS — I25810 Atherosclerosis of coronary artery bypass graft(s) without angina pectoris: Secondary | ICD-10-CM

## 2012-02-28 DIAGNOSIS — I2510 Atherosclerotic heart disease of native coronary artery without angina pectoris: Secondary | ICD-10-CM

## 2012-02-28 LAB — BASIC METABOLIC PANEL
BUN: 24 mg/dL — ABNORMAL HIGH (ref 6–23)
CO2: 25 mEq/L (ref 19–32)
Calcium: 9 mg/dL (ref 8.4–10.5)
Chloride: 104 mEq/L (ref 96–112)
Creatinine, Ser: 1.6 mg/dL — ABNORMAL HIGH (ref 0.4–1.2)
GFR: 34.23 mL/min — ABNORMAL LOW (ref >60.00)
Glucose, Bld: 157 mg/dL — ABNORMAL HIGH (ref 70–99)
Potassium: 4 mEq/L (ref 3.5–5.1)
Sodium: 136 mEq/L (ref 135–145)

## 2012-02-28 LAB — BRAIN NATRIURETIC PEPTIDE: Pro B Natriuretic peptide (BNP): 128 pg/mL — ABNORMAL HIGH (ref 0.0–100.0)

## 2012-02-28 MED ORDER — ALPRAZOLAM 0.25 MG PO TABS: 0.2500 mg | ORAL_TABLET | Freq: Every evening | ORAL | Status: DC | PRN

## 2012-02-28 NOTE — Patient Instructions (Addendum)
Your physician recommends that you have lab work today--BMET/BNP   Your physician has requested that you have en exercise stress myoview. For further information please visit [email or web address]. Please follow instruction sheet, as given.   Your physician has recommended that you wear an event monitor. Event monitors are medical devices that record the heart's electrical activity. Doctors most often [place] these monitors to diagnose arrhythmias. Arrhythmias are problems with the speed or rhythm of the heartbeat. The monitor is a small, portable device. You can wear one while you do your normal daily activities. This is usually used to diagnose what is causing palpitations/syncope (passing out). 30 day monitor    Use Xanax 0.25mg  at bedtime as needed for sleep. Dr Aundra Dubin will not be able to provide additional refills. If you feel you need a refill on this you should discuss it with your primary care provider.  Your physician recommends that you schedule a follow-up appointment in: 5-6 weeks with Dr Aundra Dubin

## 2012-02-28 NOTE — Progress Notes (Signed)
Patient ID: Hannah [name], female   DOB: 1945-08-11, 66 y.o.   MRN: [id number] PCP: Dr. Suzy Bouchard  66 yo with history of CAD s/p CABG, bioprosthetic AVR, CKD, paroxysmal atrial fibrillation presents for cardiology followup.  She had her initial CABG in 1998, then had redo CABG in 2009 with bioprosthetic AVR.  She does not remember when she last had documented atrial fibrillation, but she thinks it was a number of years ago, sounds peri-operative around the time of her redo CABG.  She was never put on anticoagulation but has been on dronedarone.    This summer, she developed severe headaches.  She went to see a neurologist and has had an extensive workup.  MRI head showed small vessel ischemic changes in the pons with concern for a small pontine CVA.  Initially, she was thought to have a thrombus in the left IJ where if drains from the CNS sinuses on MRV, but it was finally thought that this was artifact.  She had an EEG that was not suggestive of epileptiform activity.  MRA showed no significant intracranial vascular disease, and she says that carotid dopplers looked ok.  She was started on Plavix.    Main complaint currently are spells where she will feel "shaky" and be unable to walk for about 15 seconds.  She does not feel tachypalpitations during these episodes.  She does feel her heart race and flutter, mostly when she is anxious.  She has been having a lot of anxiety recently.  No chest pain.  She is short of breath after walking 50-100 yards and feels weak and fatigued in general.    ECG: NSR, QT ok, shallow lateral T wave inversions.   Labs (2/13): BNP 80, K 5.3, creatinine 1.1 Labs (6/13): LDL 76, HDL 41 Labs (8/13): K 4.3, creatinine 1.38  PMH: 1. Diabetes mellitus 2. HTN 3. Obesity 4. Paroxysmal atrial fibrillation on dronedarone.  She has a history of amiodarone toxicity. She has not been on coumadin.  5. CAD: CABG 1998 with LIMA-LAD/diagonal, sequential SVG-OM1/OM2, SVG-RCA.  Redo CABG  2009 with SVG-OM3, SVG-PDA.  6. Bioprosthetic AVR in 2009 7. CKD 8. Echo (2011) with EF 50-55%, bioprosthetic aortic valve well-seated.  Echo (2/13) with EF 60-65%, bioprosthetic aortic valve looked ok.  9. Asthma: since her teens. 10. Low back pain.  11. 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  Per neurologist, did not appear cardioembolic.  Carotid dopplers without significant stenosis at [facility].   SH: Never smoked.  Lives in [place].  Married.    FH: No premature CAD.   ROS: All systems reviewed and negative except as per HPI.   Current Outpatient Prescriptions  Medication Sig Dispense Refill  . albuterol (PROVENTIL) (2.5 MG/3ML) 0.083% nebulizer solution Take 2.5 mg by nebulization every 6 (six) hours as needed. For cough      . albuterol (PROVENTIL,VENTOLIN) 90 MCG/ACT inhaler Inhale 2 puffs into the lungs every 6 (six) hours as needed. For shortness of breath      . amLODipine (NORVASC) 10 MG tablet Take 10 mg by mouth daily.      . citalopram (CELEXA) 20 MG tablet Take 1 tablet (20 mg total) by mouth daily.  30 tablet  6  . clopidogrel (PLAVIX) 75 MG tablet Take 75 mg by mouth daily.      Marland Kitchen dronedarone (MULTAQ) 400 MG tablet Take 400 mg by mouth 2 (two) times daily with a meal.      .  insulin glargine (LANTUS) 100 UNIT/ML injection Inject 50 Units into the skin daily.       . insulin lispro (HUMALOG) 100 UNIT/ML injection Inject 30 Units into the skin 3 (three) times daily before meals.      Marland Kitchen lisinopril (PRINIVIL,ZESTRIL) 2.5 MG tablet Take 1 tablet by mouth Daily.      Marland Kitchen loratadine (CLARITIN) 10 MG tablet Take 1 tablet (10 mg total) by mouth daily as needed. For allergies.  30 tablet  3  . omeprazole (PRILOSEC) 40 MG capsule Take 40 mg by mouth daily.      . ondansetron (ZOFRAN) 4 MG tablet Take 4 mg by mouth every 8 (eight) hours as needed. For nausea.      . rosuvastatin (CRESTOR) 40 MG tablet Take 40 mg by mouth daily.      . SYMBICORT 160-4.5 MCG/ACT  inhaler INHALE TWO PUFFS BY MOUTH TWICE DAILY  11 g  2    BP 130/68  Pulse 99  Resp 18  Ht 5\' 4"  (1.626 m)  Wt 200 lb (90.719 kg)  BMI 34.33 kg/m2  SpO2 94% General: NAD Neck: Thick neck, no JVD, no thyromegaly or thyroid nodule.  Lungs: Clear to auscultation bilaterally with normal respiratory effort. CV: Nondisplaced PMI.  Heart regular S1/S2, no S3/S4, 1/6 SEM RUSB.  Trace ankle edema.  No carotid bruit.  Normal pedal pulses.  Abdomen: Soft, nontender, no hepatosplenomegaly, no distention.  Neurologic: Alert and oriented x 3.  Psych: Normal affect. Extremities: No clubbing or cyanosis.   Assessment/Plan: 1. CAD  Exertional dypsnea, general fatigue.  She does not appear volume overloaded on exam.  Symptoms seem to be a bit worse than the past.  I will have her do an ETT-myoview to assess for ischemia.  Continue Plavix, statin, ACEI.   2. ATRIAL FIBRILLATION Patient has not been on anticoagulation. She says it has been several years since she has had documented atrial fibrillation; it seems that the atrial fibrillation may have been post-op after redo CABG.  She has been on dronedarone. She is in NSR today and was in NSR when I have seen her in the past.  She has been having "spells" where she feels profoundly weak and shaky, and she also has some palpitations.  I think we need to look into this more closely in case these "shaky" spells are due to paroxysmal atrial fibrillation.  She was recently noted on MRI to have a small pontine CVA.  I spoke today with her neurologist about the CVA.  It did not appear to be cardioembolic.  He has put her on Plavix.  I am going to get a 30 day event monitor.  If any atrial fibrillation is noted, she will need to start anticoagulation (would use apixaban or Xarelto) rather than Plavix.  3. HYPERLIPIDEMIA  Recent lipids were at goal (LDL<70).  4. CKD I will get a BMET today.  5. Bioprosthetic aortic valve Well-seated on last echo.   Hensley Aziz  [employer]

## 2012-03-04 ENCOUNTER — Ambulatory Visit (HOSPITAL_COMMUNITY): Payer: Medicare Other | Attending: Cardiology | Admitting: Radiology

## 2012-03-04 ENCOUNTER — Encounter (INDEPENDENT_AMBULATORY_CARE_PROVIDER_SITE_OTHER): Payer: Medicare Other

## 2012-03-04 VITALS — BP 129/55 | HR 90 | Ht 65.0 in | Wt 197.0 lb

## 2012-03-04 DIAGNOSIS — R002 Palpitations: Secondary | ICD-10-CM | POA: Insufficient documentation

## 2012-03-04 DIAGNOSIS — I25810 Atherosclerosis of coronary artery bypass graft(s) without angina pectoris: Secondary | ICD-10-CM

## 2012-03-04 DIAGNOSIS — R0989 Other specified symptoms and signs involving the circulatory and respiratory systems: Secondary | ICD-10-CM | POA: Insufficient documentation

## 2012-03-04 DIAGNOSIS — I4891 Unspecified atrial fibrillation: Secondary | ICD-10-CM

## 2012-03-04 DIAGNOSIS — R0602 Shortness of breath: Secondary | ICD-10-CM | POA: Insufficient documentation

## 2012-03-04 DIAGNOSIS — I4949 Other premature depolarization: Secondary | ICD-10-CM

## 2012-03-04 DIAGNOSIS — E119 Type 2 diabetes mellitus without complications: Secondary | ICD-10-CM | POA: Insufficient documentation

## 2012-03-04 DIAGNOSIS — Z8249 Family history of ischemic heart disease and other diseases of the circulatory system: Secondary | ICD-10-CM | POA: Insufficient documentation

## 2012-03-04 DIAGNOSIS — I10 Essential (primary) hypertension: Secondary | ICD-10-CM | POA: Insufficient documentation

## 2012-03-04 DIAGNOSIS — R0609 Other forms of dyspnea: Secondary | ICD-10-CM | POA: Insufficient documentation

## 2012-03-04 DIAGNOSIS — I359 Nonrheumatic aortic valve disorder, unspecified: Secondary | ICD-10-CM

## 2012-03-04 DIAGNOSIS — I2510 Atherosclerotic heart disease of native coronary artery without angina pectoris: Secondary | ICD-10-CM

## 2012-03-04 DIAGNOSIS — R42 Dizziness and giddiness: Secondary | ICD-10-CM | POA: Insufficient documentation

## 2012-03-04 MED ORDER — TECHNETIUM TC 99M SESTAMIBI GENERIC - CARDIOLITE
11.0000 | Freq: Once | INTRAVENOUS | Status: AC | PRN
  Administered 2012-03-04: 11 via INTRAVENOUS

## 2012-03-04 MED ORDER — TECHNETIUM TC 99M SESTAMIBI GENERIC - CARDIOLITE
33.0000 | Freq: Once | INTRAVENOUS | Status: AC | PRN
  Administered 2012-03-04: 33 via INTRAVENOUS

## 2012-03-04 MED ORDER — REGADENOSON 0.4 MG/5ML IV SOLN
0.4000 mg | Freq: Once | INTRAVENOUS | Status: AC
  Administered 2012-03-04: 0.4 mg via INTRAVENOUS

## 2012-03-04 NOTE — Progress Notes (Addendum)
[address] [place] [address] [phone]  Cardiology Nuclear Med Study  Hannah [name] is a 66 y.o. female     MRN : [id number]     DOB: [birth year]  Procedure Date: 03/04/2012  Nuclear Med Background Indication for Stress Test:  Evaluation for Ischemia and Graft Patency History:  Atrial Fibrillation; '98 CABG; '09 [id number] anterior ischemia, EF=62%>Cath>ReDo CABG with AVR; 2/13 Echo:EF=65% Cardiac Risk Factors: Carotid Disease, CVA, Family History - CAD, Hypertension, IDDM Type 2, Lipids and Obesity  Symptoms:  Dizziness, DOE/SOB, Fatigue, Nausea, Near Syncope, Palpitations and Rapid HR with Dyspnea   Nuclear Pre-Procedure Caffeine/Decaff Intake:  10:00pm NPO After: 7:00am   Lungs:  Clear. O2 Sat: 98% on room air. IV 0.9% NS with Angio Cath:  22g  IV Site: L Hand  IV Started by:  Matilde Haymaker, RN  Chest Size (in):  42 Cup Size: C  Height: 5\' 5"  (1.651 m)  Weight:  197 lb (89.359 kg)  BMI:  Body mass index is 32.78 kg/(m^2). Tech Comments:  No Insulin this am;CBG at 1100 was 150    Nuclear Med Study 1 or 2 day study: 1 day  Stress Test Type:  Treadmill/Lexiscan  Reading MD: Kirk Ruths, MD  Order Authorizing Provider:  Loralie Champagne ,MD  Resting Radionuclide: Technetium 27m Sestamibi  Resting Radionuclide Dose: 11.0 mCi   Stress Radionuclide:  Technetium 24m Sestamibi  Stress Radionuclide Dose: 33.0 mCi           Stress Protocol Rest HR: 90 Stress HR: 116  Rest BP: 129/55 Stress BP: 187/48  Exercise Time (min): 4:39 METS: 4.6   Predicted Max HR: 154 bpm % Max HR: 75.32 bpm Rate Pressure Product: 21692   Dose of Adenosine (mg):  n/a Dose of Lexiscan: 0.4 mg  Dose of Atropine (mg): n/a Dose of Dobutamine: n/a   Stress Test Technologist: Letta Moynahan, CMA-N  Nuclear Technologist:  Charlton Amor, CNMT     Rest Procedure:  Myocardial perfusion imaging was performed at rest 45 minutes following the  intravenous administration of Technetium 58m Sestamibi.  Rest ECG: Minor nonspecific T-wave changes with rare PAC's.  Stress Procedure:  The patient initially exercised on the treadmill utilizing the Bruce protocol for three minutes, but was unable to reach her target heart rate.  The treadmill was then stopped and she was seated and given IV Lexiscan 0.4 mg over 15-seconds.  Technetium 69m Sestamibi was injected at 30-seconds.  There were no diagnostic ST-T wave changes with Lexiscan, occasional PVC's/PAC's were noted.  She did c/o chest pressure, 10/10.  Quantitative spect images were obtained after a 45 minute delay and images were check before patient left.  Stress ECG: No significant ST segment change suggestive of ischemia.  QPS Raw Data Images:  Acquisition technically good; normal left ventricular size. Stress Images:  Normal homogeneous uptake in all areas of the myocardium. Rest Images:  Normal homogeneous uptake in all areas of the myocardium. Subtraction (SDS):  No evidence of ischemia. Transient Ischemic Dilatation (Normal <1.22):  0.99 Lung/Heart Ratio (Normal <0.45):  0.27  Quantitative Gated Spect Images QGS EDV:  68 ml QGS ESV:  27 ml  Impression Exercise Capacity:  Lexiscan with low level exercise. BP Response:  Normal blood pressure response. Clinical Symptoms:  There is chest pain and dyspnea. ECG Impression:  No significant ST segment change suggestive of ischemia. Comparison with Prior Nuclear Study: No images to compare  Overall Impression:  Normal stress nuclear study.  LV Ejection Fraction: 61%.  LV Wall Motion:  NL LV Function; NL Wall Motion  Kirk Ruths  Normal study, please inform patient.   Loralie Champagne 03/05/2012

## 2012-03-06 NOTE — Progress Notes (Signed)
Pt.notified

## 2012-04-02 NOTE — Addendum Note (Signed)
Addended by: Levora Angel L on: 04/02/2012 03:29 PM   Modules accepted: Orders

## 2012-04-10 ENCOUNTER — Ambulatory Visit (INDEPENDENT_AMBULATORY_CARE_PROVIDER_SITE_OTHER): Payer: Medicare Other | Admitting: Cardiology

## 2012-04-10 ENCOUNTER — Encounter: Payer: Self-pay | Admitting: Cardiology

## 2012-04-10 VITALS — BP 108/50 | HR 84 | Ht 64.5 in | Wt 205.0 lb

## 2012-04-10 DIAGNOSIS — E785 Hyperlipidemia, unspecified: Secondary | ICD-10-CM

## 2012-04-10 DIAGNOSIS — Z954 Presence of other heart-valve replacement: Secondary | ICD-10-CM

## 2012-04-10 DIAGNOSIS — I25810 Atherosclerosis of coronary artery bypass graft(s) without angina pectoris: Secondary | ICD-10-CM

## 2012-04-10 DIAGNOSIS — I2510 Atherosclerotic heart disease of native coronary artery without angina pectoris: Secondary | ICD-10-CM

## 2012-04-10 DIAGNOSIS — I4891 Unspecified atrial fibrillation: Secondary | ICD-10-CM

## 2012-04-10 DIAGNOSIS — Z952 Presence of prosthetic heart valve: Secondary | ICD-10-CM

## 2012-04-10 NOTE — Patient Instructions (Addendum)
Your physician wants you to follow-up in: 6 months with Dr Aundra Dubin. (May 2014). You will receive a reminder letter in the mail two months in advance. If you don't receive a letter, please call our office to schedule the follow-up appointment.   Your physician recommends that you return for a FASTING lipid profile /BMET in 6 months when you see Dr Aundra Dubin.

## 2012-04-10 NOTE — Progress Notes (Signed)
Patient ID: Hannah [name], female   DOB: 20-Aug-1945, 66 y.o.   MRN: [id number] PCP: Dr. Suzy Bouchard  66 yo with history of CAD s/p CABG, bioprosthetic AVR, CKD, paroxysmal atrial fibrillation presents for cardiology followup.  She had her initial CABG in 1998, then had redo CABG in 2009 with bioprosthetic AVR.  She does not remember when she last had documented atrial fibrillation, but she thinks it was a number of years ago, sounds peri-operative around the time of her redo CABG.  She was never put on anticoagulation but has been on dronedarone.    This summer, she developed severe headaches.  She went to see a neurologist and has had an extensive workup.  MRI head showed small vessel ischemic changes in the pons with concern for a small pontine CVA.  Initially, she was thought to have a thrombus in the left IJ where if drains from the CNS sinuses on MRV, but it was finally thought that this was artifact.  She had an EEG that was not suggestive of epileptiform activity.  MRA showed no significant intracranial vascular disease, and she says that carotid dopplers looked ok.  She was started on Plavix.    At last appointment, patient reported spells where she will feel "shaky" and be unable to walk for about 15 seconds.  She would not feel tachypalpitations during these episodes.  She would feel her heart race and flutter, mostly when she is anxious.  No chest pain.  She was short of breath after walking 50-100 yards and feels weak and fatigued in general.    I had her wear a 3-week monitor to look for atrial fibrillation.  No atrial fibrillation was noted.  Lexiscan myoview was done; this was a normal study.  She is feeling better overall.  No further "shaky" spells.  No tachypalpitations.  She is less anxious.   Labs (2/13): BNP 80, K 5.3, creatinine 1.1 Labs (6/13): LDL 76, HDL 41 Labs (8/13): K 4.3, creatinine 1.38 Labs (9/13): creatinine 1.6, proBNP 128  PMH: 1. Diabetes mellitus 2. HTN 3.  Obesity 4. Paroxysmal atrial fibrillation on dronedarone.  She has a history of amiodarone toxicity. She has not been on coumadin.  3-week event monitor (9/13) with no atrial fibrillation.  5. CAD: CABG 1998 with LIMA-LAD/diagonal, sequential SVG-OM1/OM2, SVG-RCA.  Redo CABG 2009 with SVG-OM3, SVG-PDA.  Lexiscan myoview (9/13): EF 61%, no ischemia or infarction.   6. Bioprosthetic AVR in 2009 7. CKD 8. Echo (2011) with EF 50-55%, bioprosthetic aortic valve well-seated.  Echo (2/13) with EF 60-65%, bioprosthetic aortic valve looked ok.  9. Asthma: since her teens. 10. Low back pain.  11. 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  Per neurologist, did not appear cardioembolic.  Carotid dopplers without significant stenosis at [facility].   SH: Never smoked.  Lives in [place].  Married.    FH: No premature CAD.    Current Outpatient Prescriptions  Medication Sig Dispense Refill  . albuterol (PROVENTIL) (2.5 MG/3ML) 0.083% nebulizer solution Take 2.5 mg by nebulization every 6 (six) hours as needed. For cough      . albuterol (PROVENTIL,VENTOLIN) 90 MCG/ACT inhaler Inhale 2 puffs into the lungs every 6 (six) hours as needed. For shortness of breath      . ALPRAZolam (XANAX) 0.25 MG tablet Take 1 tablet (0.25 mg total) by mouth at bedtime as needed for sleep.  30 tablet  0  . amLODipine (NORVASC) 10 MG tablet Take 10 mg  by mouth daily.      . citalopram (CELEXA) 20 MG tablet Take 1 tablet (20 mg total) by mouth daily.  30 tablet  6  . clopidogrel (PLAVIX) 75 MG tablet Take 75 mg by mouth daily.      Marland Kitchen dronedarone (MULTAQ) 400 MG tablet Take 400 mg by mouth 2 (two) times daily with a meal.      . insulin glargine (LANTUS) 100 UNIT/ML injection Inject 60 Units into the skin daily.       . insulin lispro (HUMALOG) 100 UNIT/ML injection Inject 30 Units into the skin 3 (three) times daily before meals.      Marland Kitchen lisinopril (PRINIVIL,ZESTRIL) 2.5 MG tablet Take 1 tablet by mouth Daily.       Marland Kitchen loratadine (CLARITIN) 10 MG tablet Take 1 tablet (10 mg total) by mouth daily as needed. For allergies.  30 tablet  3  . omeprazole (PRILOSEC) 40 MG capsule Take 40 mg by mouth daily.      . rosuvastatin (CRESTOR) 40 MG tablet Take 40 mg by mouth daily.      . SYMBICORT 160-4.5 MCG/ACT inhaler INHALE TWO PUFFS BY MOUTH TWICE DAILY  11 g  2  . ondansetron (ZOFRAN) 4 MG tablet Take 4 mg by mouth every 8 (eight) hours as needed. For nausea.        BP 108/50  Pulse 84  Ht 5' 4.5" (1.638 m)  Wt 205 lb (92.987 kg)  BMI 34.64 kg/m2 General: NAD Neck: Thick neck, no JVD, no thyromegaly or thyroid nodule.  Lungs: Clear to auscultation bilaterally with normal respiratory effort. CV: Nondisplaced PMI.  Heart regular S1/S2, no S3/S4, 2/6 SEM RUSB.  Trace ankle edema.  No carotid bruit.  Normal pedal pulses.  Abdomen: Soft, nontender, no hepatosplenomegaly, no distention.  Neurologic: Alert and oriented x 3.  Psych: Normal affect. Extremities: No clubbing or cyanosis.   Assessment/Plan: 1. CAD  Exertional dypsnea, general fatigue.  She does not appear volume overloaded on exam.  Recent Lexiscan myoview showed no ischemia or infarction. Continue Plavix, statin, ACEI.   2. ATRIAL FIBRILLATION Patient has not been on anticoagulation. She says it has been several years since she has had documented atrial fibrillation; it seems that the atrial fibrillation may have been post-op after redo CABG.  She has been on dronedarone. She is in NSR today and was in NSR when I have seen her in the past.  3 week monitor in 9/13 showed no atrial fibrillation.  She was recently noted on MRI to have a small pontine CVA.  I spoke with her neurologist about the CVA.  It did not appear to be cardioembolic.  He has put her on Plavix.  In the future, if any atrial fibrillation is noted, she will need to start anticoagulation (would use apixaban or Xarelto) rather than Plavix.  She will come by the office for an ECG if she  ever feels prolonged tachypalpitations.  3. HYPERLIPIDEMIA  Last lipids were at goal (LDL<70).  4. Bioprosthetic aortic valve Well-seated on last echo.   Loralie Champagne 04/10/2012

## 2012-06-06 ENCOUNTER — Other Ambulatory Visit: Payer: Self-pay | Admitting: Nurse Practitioner

## 2012-06-11 DIAGNOSIS — I639 Cerebral infarction, unspecified: Secondary | ICD-10-CM

## 2012-06-11 HISTORY — DX: Cerebral infarction, unspecified: I63.9

## 2012-06-11 LAB — HM COLONOSCOPY

## 2012-06-12 ENCOUNTER — Other Ambulatory Visit: Payer: Self-pay | Admitting: Cardiology

## 2012-06-30 IMAGING — MR MR LUMBAR SPINE W/O CM
4 of 5 series · 20 of 48 positions shown · non-contrast
Comparison: None

CLINICAL DATA: Low back pain with bilateral leg pain

MRI LUMBAR SPINE WITHOUT CONTRAST
TECHNIQUE: Multiplanar and multiecho pulse sequences of the lumbar
spine were obtained without intravenous contrast.

[Series 2: T2 · sagittal · 4.0mm · 0.55mm/px · 6 of 13 slices shown (1 of 2)]
[im 1/13]
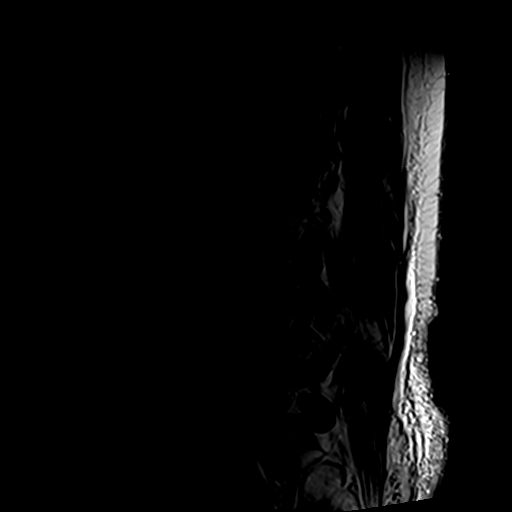
[im 3/13]
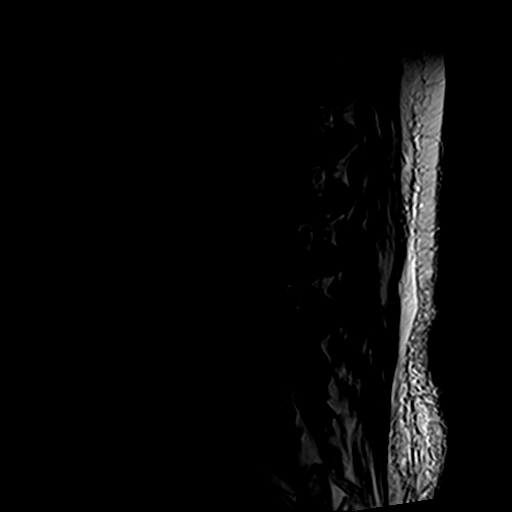
[im 5/13]
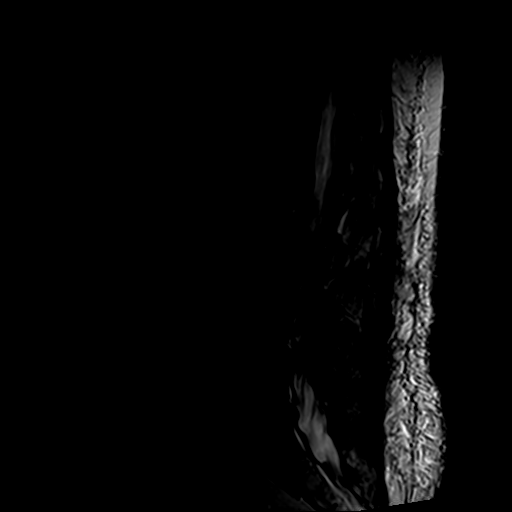
[im 8/13]
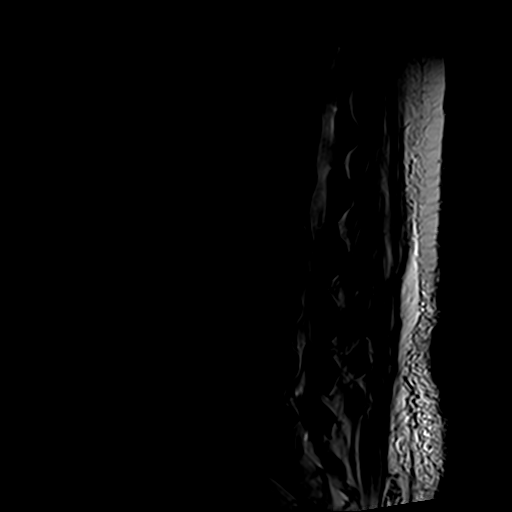
[im 10/13]
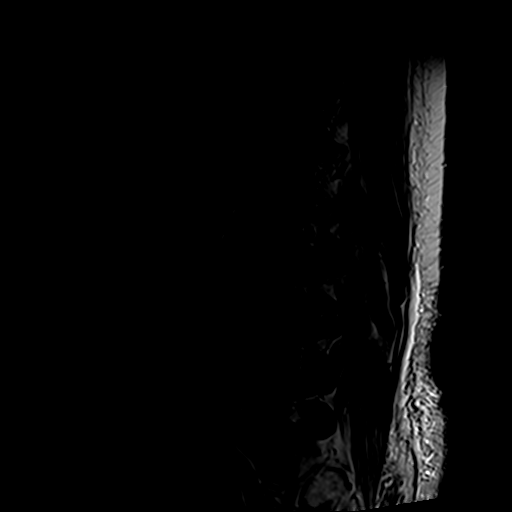
[im 13/13]
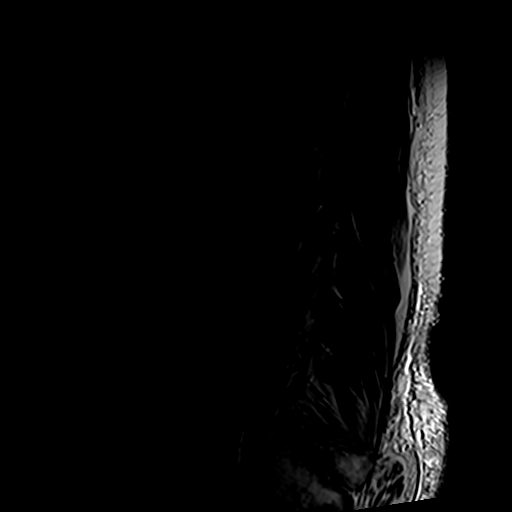

[Series 3: T1 · sagittal · 4.0mm · 0.55mm/px · 3 of 13 slices shown (1 of 2)]
[im 1/13]
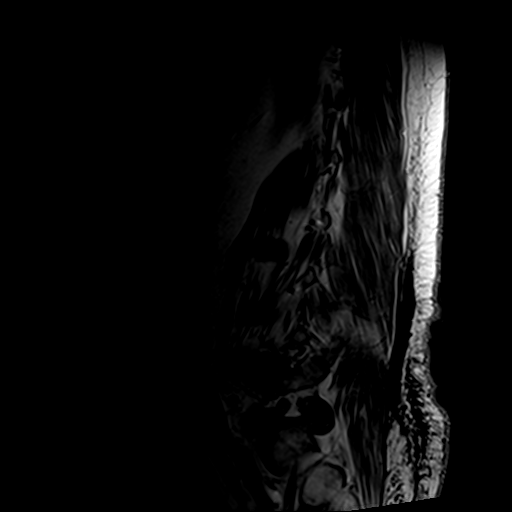
[im 7/13]
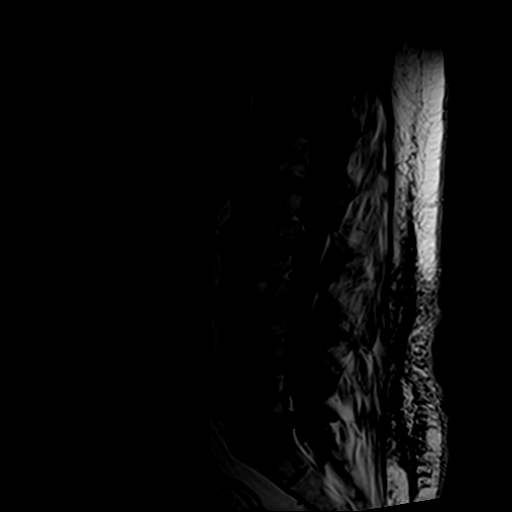
[im 13/13]
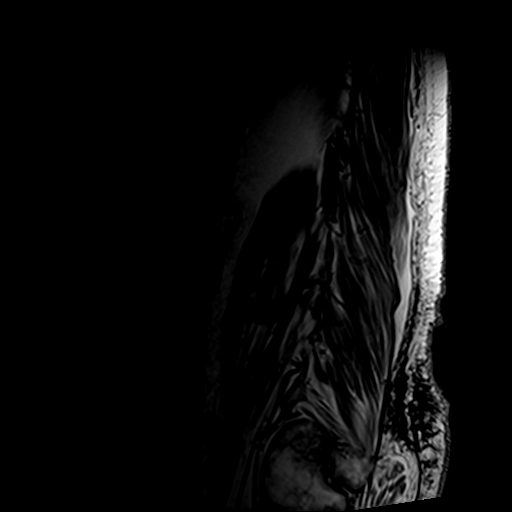

[Series 5: T2 · axial · 4.0mm · 0.33mm/px · z∈[-65,+101]mm · 8 of 40 slices shown (2 of 2)]
[im 3/40]
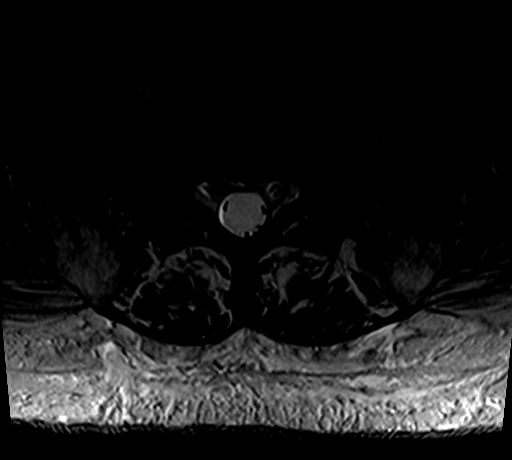
[im 6/40]
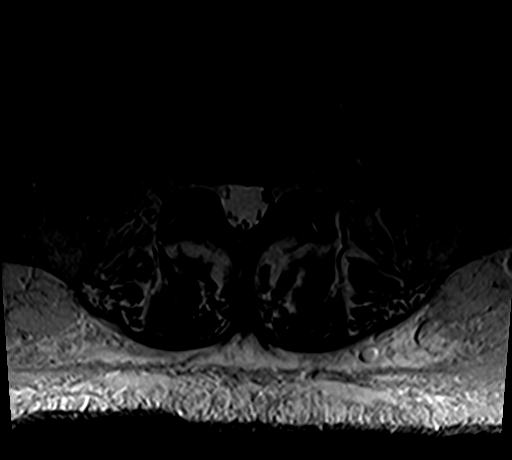
[im 8/40]
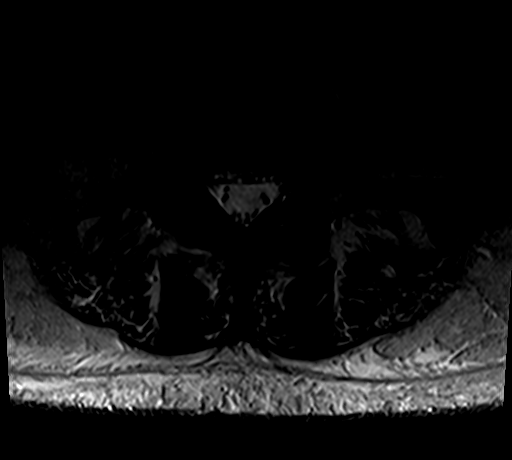
[im 14/40]
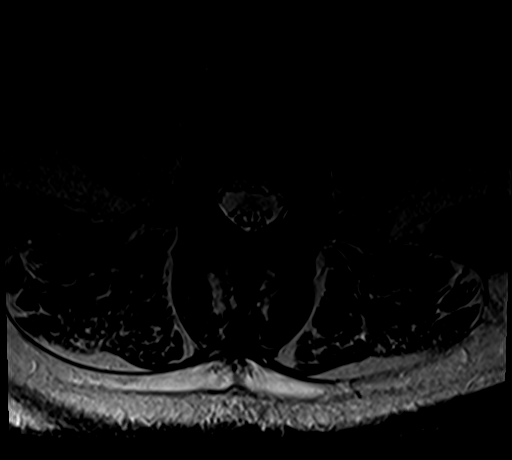
[im 19/40]
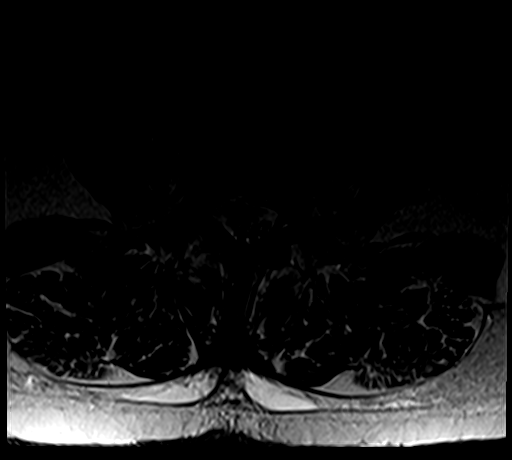
[im 21/40]
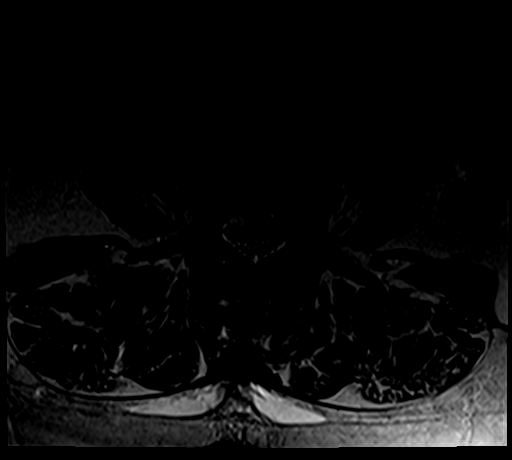
[im 24/40]
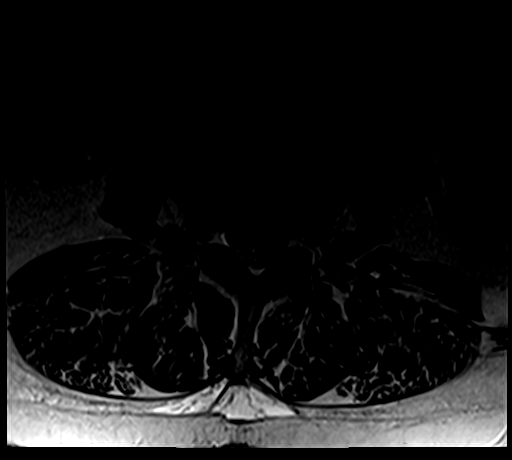
[im 34/40]
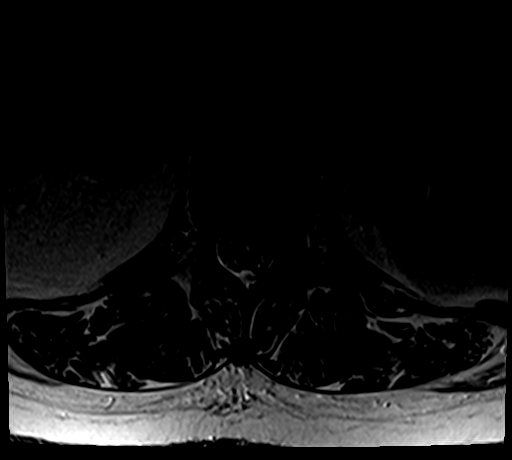

[Series 6: T1 · axial · 4.0mm · 0.33mm/px · z∈[-50,+101]mm · 3 of 40 slices shown (2 of 2)]
[im 6/40]
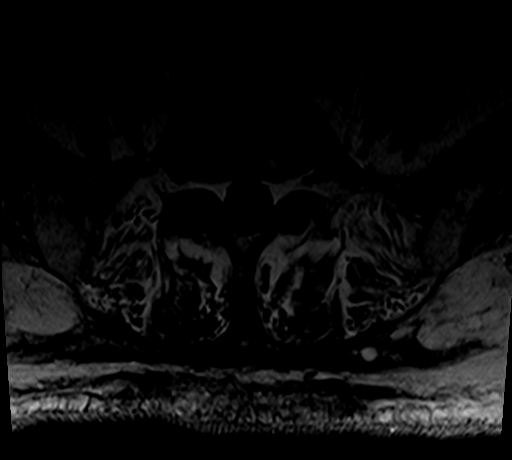
[im 21/40]
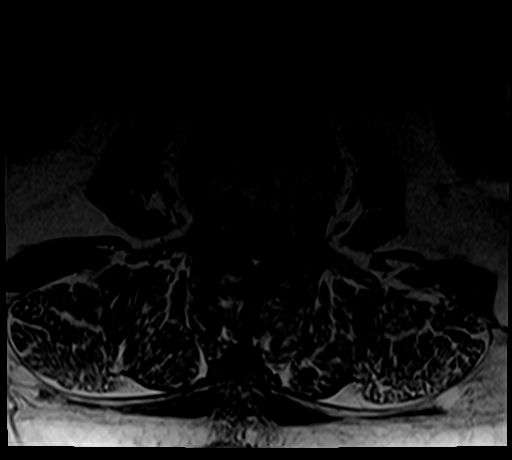
[im 34/40]
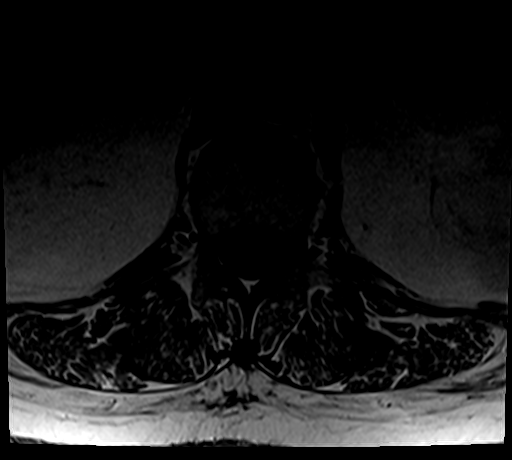

[20 of 48 positions shown; findings below may reference images not displayed]

FINDINGS: Normal lumbar alignment.  Negative for fracture or mass
lesion.  Conus medullaris is normal and terminates L1-2.

T12-L1:  Mild disc degeneration with a small central disc
protrusion.  No stenosis.

L1-2:  Negative

L2-3:  Moderately large central disc protrusion indenting the
thecal sac and causing mild to moderate spinal stenosis.  Mild
facet arthrosis is present.  Small hemangioma L2 vertebral body.

L3-4:  Disc degeneration with disc bulging.  Moderate facet
arthrosis and moderate spinal stenosis.

L4-5:  Disc degeneration with disc bulging.  Shallow right
foraminal disc protrusion with upgoing disc material could cause
impingement of the right L4 nerve root.  Facet degeneration and
mild spinal stenosis.

L5-S1:  Mild disc and facet degeneration without stenosis.
IMPRESSION: Moderately large central disc protrusion L2-3 with mild to moderate
spinal stenosis.

Moderate spinal stenosis at L3-4 due to disc and facet
degeneration.

Disc degeneration L4-5.  Small right foraminal disc protrusion
could cause impingement of the right L4 nerve root.

## 2012-07-11 ENCOUNTER — Other Ambulatory Visit: Payer: Self-pay | Admitting: Emergency Medicine

## 2012-07-16 ENCOUNTER — Telehealth: Payer: Self-pay | Admitting: Cardiology

## 2012-07-16 NOTE — Telephone Encounter (Signed)
New Problem     Pt called in stating she needs a new prescription of Lasix. Pharmacy stated prescription is too old to fill.

## 2012-07-16 NOTE — Telephone Encounter (Signed)
Pt rtn call 

## 2012-07-16 NOTE — Telephone Encounter (Signed)
LMTCB

## 2012-07-17 NOTE — Telephone Encounter (Signed)
Spoke with pt. Pt states she saw Dr Posey Pronto yesterday and he recommended that she take lasix about 3 times a week. She is going to call Dr Serita Grit office for a prescription since it is not listed on her medication list here. I offered to make a follow-up appt for pt but she declined right now.

## 2012-07-17 NOTE — Telephone Encounter (Signed)
Pt rtn call to anne °

## 2012-08-08 ENCOUNTER — Other Ambulatory Visit: Payer: Self-pay | Admitting: Cardiology

## 2012-09-09 ENCOUNTER — Ambulatory Visit (INDEPENDENT_AMBULATORY_CARE_PROVIDER_SITE_OTHER): Payer: Medicare Other | Admitting: Emergency Medicine

## 2012-09-09 ENCOUNTER — Encounter: Payer: Self-pay | Admitting: Emergency Medicine

## 2012-09-09 VITALS — BP 120/58 | HR 86 | Temp 98.4°F | Ht 65.0 in | Wt 215.4 lb

## 2012-09-09 DIAGNOSIS — J309 Allergic rhinitis, unspecified: Secondary | ICD-10-CM

## 2012-09-09 DIAGNOSIS — J45909 Unspecified asthma, uncomplicated: Secondary | ICD-10-CM

## 2012-09-09 MED ORDER — FLUTICASONE FUROATE 27.5 MCG/SPRAY NA SUSP: 2.0000 | Freq: Every day | NASAL | Status: DC

## 2012-09-09 MED ORDER — FEXOFENADINE HCL 180 MG PO TABS: 180.0000 mg | ORAL_TABLET | Freq: Every day | ORAL | Status: DC

## 2012-09-09 NOTE — Progress Notes (Signed)
  Subjective:    Patient ID: Hannah [name], female    DOB: 1946-02-06, 67 y.o.   MRN: [id number]   HPI 67 yo never smoker, hx of CAD/CABG, aortic valve replacement, A Fib. Also with hx of childhood asthma. She was diagnosed with adult asthma in her 55's in setting wheeze, SOB, cough that wouldn't go away. Was on prn SABA for a while, started on Symbicort early 2009. Had PFT's done in August 2009 and 2010 that showed mixed disease. In 10/10 FEV1 1.01L. Averages Prednisone about 3 - 4x a year. Never hospitalized or on MV due to breathing. Amiodarone stopped late 2010.   ROV 10/14/09 -- returns for f/u of her asthma, restrictive disease. Tells me that she has had PND, allergy symptoms this spring, making her mor SOB and giving her more cough.   ROV 03/07/10 -- Hx CAD/CABG, asthma and allergies. Still with some nasal congestion, doing loratadine and fluticasone spray, rarely [place]. Does some throat clearing. Taking her symbicort reliably. Uses ventolin a couple times a month.   ROV 10/04/10 -- Hx CAD/CABG, asthma and allergies. Has been having more trouble with nasal gtt and allergies and therefore more cough chest tightness. Using Symbicort. Has needed ventolin about 4x a day for last week. More SOB. Can't tolerate the NSWs.   ROV 04/24/11 -- Hx CAD/CABG, asthma and allergies. Here for follow up. She was treated over phone for bronchitis after URI - better after doxy and prednisone. Still on Symbicort bid. She is much improved, back to baseline. She has had the flu shot. She is rarely using SABA - was requiring frequently when she was exacerbated. She is having several AE a year.   ROV 02/22/12 -- Hx CAD/CABG, asthma and allergies. She is undergoing eval for HA, nausea, imbalance. May have had a small CVA - had MRI and neuro eval. Sometimes these episodes are associated with SOB. She remains on Symbicort.  She is on loratadine, uses nasal steroid prn.   ROV 09/09/12 -- Hx CAD/CABG, asthma and allergies. She is  starting to have nasal gtt and cough. She can't do [place]. Interested in changing loratadine to alternative. She doesn't like the nasal steroids because they run down her throat. Still on Symbicort.   Review of Systems as per HPI    Objective:   Physical Exam Filed Vitals:   09/09/12 1435  BP: 120/58  Pulse: 86  Temp: 98.4 F (36.9 C)   Gen: Pleasant, obese, in no distress,  normal affect  ENT: No lesions,  mouth clear,  oropharynx clear, no postnasal drip  Neck: No JVD, no TMG, no carotid bruits  Lungs: No use of accessory muscles, no dullness to percussion, clear without rales or rhonchi  Cardiovascular: RRR, heart sounds normal, no murmur or gallops, no peripheral edema  Musculoskeletal: No deformities, no cyanosis or clubbing  Neuro: alert, non focal  Skin: Warm, no lesions or rashes   Assessment & Plan:  ASTHMA Clinically stable,  - continue symbicort + SABA   ALLERGIC RHINITIS - trial changing to fexofenadine - trial of veramyst (? Better delivery system) - consider adding benadryl at night

## 2012-09-09 NOTE — Assessment & Plan Note (Signed)
-   trial changing to fexofenadine - trial of veramyst (? Better delivery system) - consider adding benadryl at night

## 2012-09-09 NOTE — Assessment & Plan Note (Signed)
Clinically stable,  - continue symbicort + SABA

## 2012-09-09 NOTE — Patient Instructions (Addendum)
Continue your Symbicort twice a day Stop loratadine and start fexofenadine 180mg  daily Try starting veramyst 2 sprays each nostril daily Use your albuterol as needed Follow with Dr Lamonte Sakai in 6 months or sooner if you have any problems

## 2012-10-11 ENCOUNTER — Other Ambulatory Visit: Payer: Self-pay | Admitting: Nurse Practitioner

## 2012-10-20 ENCOUNTER — Telehealth: Payer: Self-pay | Admitting: Emergency Medicine

## 2012-10-20 NOTE — Telephone Encounter (Signed)
Pt returned call. Hannah [name] °

## 2012-10-20 NOTE — Telephone Encounter (Signed)
ok 

## 2012-10-20 NOTE — Telephone Encounter (Signed)
I spoke with pt and she stated is going to go back to the claritin bc it had no interaction. I advised will forward to RB as an [place]

## 2012-10-20 NOTE — Telephone Encounter (Signed)
lmomtcb x1 for pt 

## 2012-10-20 NOTE — Telephone Encounter (Signed)
I spoke with pt. She stated when RB changed her Claritin to allegra. When she saw her PCP she was advised this interacts with her citalopram so she is needing an alternative. Please advise RB thanks

## 2012-10-20 NOTE — Telephone Encounter (Signed)
The only interaction I can find in the system is sedation - the combination may be sedating. If she feels that adding the allegra is making her tired or sedated then we will try to change. (all of the meds in this class share this potential combination effect).

## 2012-10-21 ENCOUNTER — Telehealth: Payer: Self-pay | Admitting: Emergency Medicine

## 2012-10-21 MED ORDER — LORATADINE 10 MG PO TABS: 10.0000 mg | ORAL_TABLET | Freq: Every day | ORAL | Status: DC

## 2012-10-21 NOTE — Telephone Encounter (Signed)
Spoke with patient  Patient switched back from [place] to claritin 10/20/12 Needing a refill sent to verified pharmacy Rx has been sent pt aware and nothing further needed at this time

## 2012-11-13 ENCOUNTER — Other Ambulatory Visit: Payer: Self-pay | Admitting: Cardiology

## 2012-11-24 ENCOUNTER — Other Ambulatory Visit: Payer: Self-pay | Admitting: Emergency Medicine

## 2012-11-25 ENCOUNTER — Other Ambulatory Visit: Payer: Self-pay | Admitting: Emergency Medicine

## 2012-11-25 MED ORDER — BUDESONIDE-FORMOTEROL FUMARATE 160-4.5 MCG/ACT IN AERO: 2.0000 | INHALATION_SPRAY | Freq: Two times a day (BID) | RESPIRATORY_TRACT | Status: DC

## 2012-11-25 NOTE — Telephone Encounter (Signed)
Spoke with pharmacist they did not receive Rx sent yesterday New Rx sent.

## 2012-12-11 ENCOUNTER — Other Ambulatory Visit: Payer: Self-pay | Admitting: Nurse Practitioner

## 2012-12-18 ENCOUNTER — Other Ambulatory Visit: Payer: Self-pay | Admitting: Cardiology

## 2013-02-16 ENCOUNTER — Encounter: Payer: Self-pay | Admitting: Nurse Practitioner

## 2013-02-16 ENCOUNTER — Ambulatory Visit (INDEPENDENT_AMBULATORY_CARE_PROVIDER_SITE_OTHER): Payer: Medicare Other | Admitting: Nurse Practitioner

## 2013-02-16 VITALS — BP 130/60 | HR 76 | Ht 64.0 in | Wt 216.4 lb

## 2013-02-16 DIAGNOSIS — E785 Hyperlipidemia, unspecified: Secondary | ICD-10-CM

## 2013-02-16 DIAGNOSIS — I10 Essential (primary) hypertension: Secondary | ICD-10-CM

## 2013-02-16 DIAGNOSIS — I25810 Atherosclerosis of coronary artery bypass graft(s) without angina pectoris: Secondary | ICD-10-CM

## 2013-02-16 LAB — BASIC METABOLIC PANEL
BUN: 24 mg/dL — ABNORMAL HIGH (ref 6–23)
CO2: 30 mEq/L (ref 19–32)
Calcium: 9.3 mg/dL (ref 8.4–10.5)
Chloride: 104 mEq/L (ref 96–112)
Creatinine, Ser: 1.4 mg/dL — ABNORMAL HIGH (ref 0.4–1.2)
GFR: 40.48 mL/min — ABNORMAL LOW (ref >60.00)
Glucose, Bld: 146 mg/dL — ABNORMAL HIGH (ref 70–99)
Potassium: 4.2 mEq/L (ref 3.5–5.1)
Sodium: 140 mEq/L (ref 135–145)

## 2013-02-16 LAB — CBC WITH DIFFERENTIAL/PLATELET
Basophils Absolute: 0.1 10*3/uL (ref 0.0–0.1)
Basophils Relative: 1.2 % (ref 0.0–3.0)
Eosinophils Absolute: 0.5 10*3/uL (ref 0.0–0.7)
Eosinophils Relative: 7.4 % — ABNORMAL HIGH (ref 0.0–5.0)
HCT: 34 % — ABNORMAL LOW (ref 36.0–46.0)
Hemoglobin: 11.5 g/dL — ABNORMAL LOW (ref 12.0–15.0)
Lymphocytes Relative: 16.7 % (ref 12.0–46.0)
Lymphs Abs: 1.1 10*3/uL (ref 0.7–4.0)
MCHC: 33.7 g/dL (ref 30.0–36.0)
MCV: 86.2 fl (ref 78.0–100.0)
Monocytes Absolute: 0.7 10*3/uL (ref 0.1–1.0)
Monocytes Relative: 10.8 % (ref 3.0–12.0)
Neutro Abs: 4.2 10*3/uL (ref 1.4–7.7)
Neutrophils Relative %: 63.9 % (ref 43.0–77.0)
Platelets: 188 10*3/uL (ref 150.0–400.0)
RBC: 3.95 Mil/uL (ref 3.87–5.11)
RDW: 13 % (ref 11.5–14.6)
WBC: 6.6 10*3/uL (ref 4.5–10.5)

## 2013-02-16 LAB — LIPID PANEL
Cholesterol: 160 mg/dL (ref 0–200)
HDL: 37.2 mg/dL — ABNORMAL LOW (ref >39.00)
LDL Cholesterol: 84 mg/dL (ref 0–99)
Total CHOL/HDL Ratio: 4
Triglycerides: 192 mg/dL — ABNORMAL HIGH (ref 0.0–149.0)
VLDL: 38.4 mg/dL (ref 0.0–40.0)

## 2013-02-16 LAB — HEPATIC FUNCTION PANEL
ALT: 20 U/L (ref 0–35)
AST: 21 U/L (ref 0–37)
Albumin: 3.6 g/dL (ref 3.5–5.2)
Alkaline Phosphatase: 44 U/L (ref 39–117)
Bilirubin, Direct: 0.1 mg/dL (ref 0.0–0.3)
Total Bilirubin: 0.8 mg/dL (ref 0.3–1.2)
Total Protein: 6.7 g/dL (ref 6.0–8.3)

## 2013-02-16 LAB — TSH: TSH: 1.72 u[IU]/mL (ref 0.35–5.50)

## 2013-02-16 MED ORDER — CITALOPRAM HYDROBROMIDE 20 MG PO TABS: ORAL_TABLET | ORAL | Status: DC

## 2013-02-16 MED ORDER — ROSUVASTATIN CALCIUM 40 MG PO TABS: 40.0000 mg | ORAL_TABLET | Freq: Every day | ORAL | Status: DC

## 2013-02-16 NOTE — Progress Notes (Addendum)
Hannah [name] Date of Birth: January 28, 1946 Medical Record [id number]  History of Present Illness: Hannah [name] is seen back today for a follow up visit. It is an eleven month check. Seen for Dr. Aundra Dubin. Has multiple issues which include DM, CAD with redo CABG, AVR, CRI, obesity, HTN, PAF on Multaq and chronic shortness of breath - had amiodarone toxicity. Started on Plavix in the summer of 2013 - MRI had shown small vessel ischemic changes in the pons with concern for a small pontine CVA - negative EEG for epilepsy. Last Myoview was in 2013. Negative 3 week event monitor looking for atrial fib as well.   Last seen here back in October of 2013.   Comes in today. Here with her husband. Doing ok. Has been more limited by her back. Has seen Dr. Vertell Limber and may be considering some back surgery in the future - needs a "spacer" placed between 2 vertebrae. Has had a good year for the most part. Working at her church daily. Doing all of her own housework. Not really exercising due to her back. Breathing is "fair" but not any worse - sees pulmonary next month. No palpitations. Not as anxious. No chest pain but had a spell about 3 weeks ago where she woke up with a "wierd feeling" in her chest. BP was 178/98 and her HR was up to 138. She took a Xanax and within 20 minutes - she was fine. It has not recurred. She has otherwise had no cardiac issues.   Current Outpatient Prescriptions  Medication Sig Dispense Refill  . albuterol (PROVENTIL) (2.5 MG/3ML) 0.083% nebulizer solution Take 2.5 mg by nebulization every 6 (six) hours as needed. For cough      . albuterol (PROVENTIL,VENTOLIN) 90 MCG/ACT inhaler Inhale 2 puffs into the lungs every 6 (six) hours as needed. For shortness of breath      . ALPRAZolam (XANAX) 0.25 MG tablet Take 1 tablet (0.25 mg total) by mouth at bedtime as needed for sleep.  30 tablet  0  . amLODipine (NORVASC) 10 MG tablet TAKE ONE TABLET BY MOUTH EVERY DAY  30 tablet  5  . budesonide-formoterol  (SYMBICORT) 160-4.5 MCG/ACT inhaler Inhale 2 puffs into the lungs 2 (two) times daily.  1 Inhaler  6  . citalopram (CELEXA) 20 MG tablet TAKE ONE TABLET BY MOUTH ONCE DAILY  30 tablet  1  . clopidogrel (PLAVIX) 75 MG tablet Take 75 mg by mouth daily.      Marland Kitchen dronedarone (MULTAQ) 400 MG tablet Take 400 mg by mouth 2 (two) times daily with a meal.      . insulin glargine (LANTUS) 100 UNIT/ML injection Inject 50 Units into the skin daily.       . insulin lispro (HUMALOG) 100 UNIT/ML injection Inject 30 Units into the skin 3 (three) times daily before meals.      Marland Kitchen lisinopril (PRINIVIL,ZESTRIL) 2.5 MG tablet Take 1 tablet by mouth Daily.      Marland Kitchen loratadine (CLARITIN) 10 MG tablet Take 1 tablet (10 mg total) by mouth daily.  30 tablet  6  . MULTAQ 400 MG tablet TAKE ONE TABLET BY MOUTH TWICE DAILY WITH MEALS  60 tablet  2  . omeprazole (PRILOSEC) 40 MG capsule Take 40 mg by mouth daily.      . rosuvastatin (CRESTOR) 40 MG tablet Take 40 mg by mouth daily.      . traMADol (ULTRAM) 50 MG tablet Take 50 mg by mouth every 8 (eight) hours  as needed.        No current facility-administered medications for this visit.    Allergies  Allergen Reactions  . Amoxicillin Rash  . Tetracycline Rash   PMH: 1. Diabetes mellitus  2. HTN  3. Obesity  4. Paroxysmal atrial fibrillation on dronedarone. She has a history of amiodarone toxicity. She has not been on coumadin. 3-week event monitor (9/13) with no atrial fibrillation.  5. CAD: CABG 1998 with LIMA-LAD/diagonal, sequential SVG-OM1/OM2, SVG-RCA. Redo CABG 2009 with SVG-OM3, SVG-PDA. Lexiscan myoview (9/13): EF 61%, no ischemia or infarction.  6. Bioprosthetic AVR in 2009  7. CKD  8. Echo (2011) with EF 50-55%, bioprosthetic aortic valve well-seated. Echo (2/13) with EF 60-65%, bioprosthetic aortic valve looked ok.  9. Asthma: since her teens.  10. Low back pain.  11. 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 Per neurologist, did not appear  cardioembolic. Carotid dopplers without significant stenosis at [facility].    Past Medical History  Diagnosis Date  . Hypertension   . Hyperlipidemia   . Atrial fibrillation     in sinus on Multaq  . CHF (congestive heart failure)     EF is low normal at 50 to 55% per echo in Jan. 2011  . Renal insufficiency, mild   . Dyspnea     chronic; followed by Dr. Lamonte Sakai  . Nonischemic cardiomyopathy   . Anemia   . Diabetes mellitus   . Iron deficiency anemia   . High risk medication use   . CAD (coronary artery disease)     Remote CABG in 1998 with redo in 2009  . S/P AVR 2009  . Amiodarone toxicity   . Hearing loss of right ear   . Stroke     Past Surgical History  Procedure Laterality Date  . Coronary artery bypass graft  1998 & 2009    Had LIMA to DX/LAD, SVG to 2 marginal branches and SVG to [facility] originally; SVG to 3rd OM and PD at time of redo  . Aortic valve replacement  2009    #72mm [facility] Ease pericardial valve  . Cardiac catheterization  03/23/2008    OVERALL CARDIAC SIZE AND SILHOUETTE WERE NORMAL. EF IS 55%    History  Smoking status  . Never Smoker   Smokeless tobacco  . Never Used    History  Alcohol Use No    Family History  Problem Relation Age of Onset  . Heart disease Mother   . Heart disease Father   . Heart failure Father     Review of Systems: The review of systems is per the HPI.  All other systems were reviewed and are negative.  Physical Exam: BP 130/60  Pulse 76  Ht 5\' 4"  (1.626 m)  Wt 216 lb 6.4 oz (98.158 kg)  BMI 37.13 kg/m2 Patient is very pleasant and in no acute distress. Skin is warm and dry. Color is normal.  HEENT is unremarkable. Normocephalic/atraumatic. PERRL. Sclera are nonicteric. Neck is supple. No masses. No JVD. Lungs are clear. Cardiac exam shows a regular rate and rhythm. Abdomen is soft. Extremities are without edema. Gait and ROM are intact. No gross neurologic deficits noted.  LABORATORY DATA: EKG  today shows sinus rhythm, nonspecific T wave inversion. No acute changes. QT is 358.     Chemistry      Component Value Date/Time   NA 136 02/28/2012 1221   K 4.0 02/28/2012 1221   CL 104 02/28/2012 1221  CO2 25 02/28/2012 1221   BUN 24* 02/28/2012 1221   CREATININE 1.6* 02/28/2012 1221      Component Value Date/Time   CALCIUM 9.0 02/28/2012 1221   ALKPHOS 61 02/01/2012 1321   AST 16 02/01/2012 1321   ALT 17 02/01/2012 1321   BILITOT 1.0 02/01/2012 1321     Lab Results  Component Value Date   WBC 5.2 02/01/2012   HGB 11.5* 02/01/2012   HCT 34.2* 02/01/2012   MCV 85.9 02/01/2012   PLT 208 02/01/2012    Assessment / Plan:  1. CAD - past redo CABG - negative Lexiscan in September of 2013. Doing well. One lone episode which may have been more atrial fib. She will let us know if it recurs.   2. PAF - on Multaq - need to check EKG today - she is on Celexa but at 20 mg a day.   3. Cerebral vascular disease - on Plavix  4. CKD - rechecking labs today.   5. AVR - last echo September of 2013 - EF is normal at 50 to 55%  6. Asthma - followed by Dr. Lamonte Sakai   7. Back pain - seems to be her most limiting factor - she is considering surgery and will let us know.   8. HLD - checking labs today.  Patient is agreeable to this plan and will call if any problems develop in the interim.   [name], RN, [place] [address] [place], [place]  [address]

## 2013-02-16 NOTE — Patient Instructions (Addendum)
Stay on your current medicines  I will see you back in 6 months  We will check labs today and an EKG today  Call the [place] office at [phone] if you have any questions, problems or concerns.

## 2013-02-23 ENCOUNTER — Encounter (HOSPITAL_COMMUNITY): Payer: Self-pay | Admitting: *Deleted

## 2013-02-23 ENCOUNTER — Encounter (HOSPITAL_COMMUNITY)
Admission: EM | Disposition: A | Discharge status: Home / Self Care | Payer: Self-pay | Source: Home / Self Care | Attending: Cardiology

## 2013-02-23 ENCOUNTER — Inpatient Hospital Stay (HOSPITAL_COMMUNITY)
Admission: EM | Admit: 2013-02-23 | Discharge: 2013-02-24 | DRG: 281 | Disposition: A | Discharge status: Home / Self Care | Payer: Medicare Other | Attending: Cardiology | Admitting: Cardiology

## 2013-02-23 ENCOUNTER — Emergency Department (HOSPITAL_COMMUNITY): Payer: Medicare Other

## 2013-02-23 DIAGNOSIS — E785 Hyperlipidemia, unspecified: Secondary | ICD-10-CM | POA: Diagnosis present

## 2013-02-23 DIAGNOSIS — R6510 Systemic inflammatory response syndrome (SIRS) of non-infectious origin without acute organ dysfunction: Secondary | ICD-10-CM

## 2013-02-23 DIAGNOSIS — I2510 Atherosclerotic heart disease of native coronary artery without angina pectoris: Secondary | ICD-10-CM | POA: Diagnosis present

## 2013-02-23 DIAGNOSIS — E669 Obesity, unspecified: Secondary | ICD-10-CM | POA: Diagnosis present

## 2013-02-23 DIAGNOSIS — I5032 Chronic diastolic (congestive) heart failure: Secondary | ICD-10-CM | POA: Diagnosis present

## 2013-02-23 DIAGNOSIS — I509 Heart failure, unspecified: Secondary | ICD-10-CM | POA: Diagnosis present

## 2013-02-23 DIAGNOSIS — J45909 Unspecified asthma, uncomplicated: Secondary | ICD-10-CM | POA: Diagnosis present

## 2013-02-23 DIAGNOSIS — Z8673 Personal history of transient ischemic attack (TIA), and cerebral infarction without residual deficits: Secondary | ICD-10-CM

## 2013-02-23 DIAGNOSIS — Z8249 Family history of ischemic heart disease and other diseases of the circulatory system: Secondary | ICD-10-CM

## 2013-02-23 DIAGNOSIS — Z952 Presence of prosthetic heart valve: Secondary | ICD-10-CM

## 2013-02-23 DIAGNOSIS — Z6837 Body mass index (BMI) 37.0-37.9, adult: Secondary | ICD-10-CM

## 2013-02-23 DIAGNOSIS — I4891 Unspecified atrial fibrillation: Secondary | ICD-10-CM

## 2013-02-23 DIAGNOSIS — I428 Other cardiomyopathies: Secondary | ICD-10-CM | POA: Diagnosis present

## 2013-02-23 DIAGNOSIS — E119 Type 2 diabetes mellitus without complications: Secondary | ICD-10-CM | POA: Diagnosis present

## 2013-02-23 DIAGNOSIS — Z79899 Other long term (current) drug therapy: Secondary | ICD-10-CM

## 2013-02-23 DIAGNOSIS — H9190 Unspecified hearing loss, unspecified ear: Secondary | ICD-10-CM | POA: Diagnosis present

## 2013-02-23 DIAGNOSIS — I10 Essential (primary) hypertension: Secondary | ICD-10-CM | POA: Diagnosis present

## 2013-02-23 DIAGNOSIS — I249 Acute ischemic heart disease, unspecified: Secondary | ICD-10-CM

## 2013-02-23 DIAGNOSIS — Z881 Allergy status to other antibiotic agents status: Secondary | ICD-10-CM

## 2013-02-23 DIAGNOSIS — D509 Iron deficiency anemia, unspecified: Secondary | ICD-10-CM | POA: Diagnosis present

## 2013-02-23 DIAGNOSIS — I214 Non-ST elevation (NSTEMI) myocardial infarction: Principal | ICD-10-CM | POA: Diagnosis present

## 2013-02-23 DIAGNOSIS — I25810 Atherosclerosis of coronary artery bypass graft(s) without angina pectoris: Secondary | ICD-10-CM | POA: Diagnosis present

## 2013-02-23 DIAGNOSIS — D649 Anemia, unspecified: Secondary | ICD-10-CM

## 2013-02-23 DIAGNOSIS — E1169 Type 2 diabetes mellitus with other specified complication: Secondary | ICD-10-CM | POA: Diagnosis present

## 2013-02-23 DIAGNOSIS — Z7902 Long term (current) use of antithrombotics/antiplatelets: Secondary | ICD-10-CM

## 2013-02-23 DIAGNOSIS — R0602 Shortness of breath: Secondary | ICD-10-CM

## 2013-02-23 DIAGNOSIS — N289 Disorder of kidney and ureter, unspecified: Secondary | ICD-10-CM | POA: Diagnosis present

## 2013-02-23 DIAGNOSIS — Z794 Long term (current) use of insulin: Secondary | ICD-10-CM

## 2013-02-23 DIAGNOSIS — Z8679 Personal history of other diseases of the circulatory system: Secondary | ICD-10-CM | POA: Diagnosis present

## 2013-02-23 HISTORY — PX: LEFT HEART CATHETERIZATION WITH CORONARY/GRAFT ANGIOGRAM: SHX5450

## 2013-02-23 LAB — CK TOTAL AND CKMB (NOT AT ARMC)
CK, MB: 3.3 ng/mL (ref 0.3–4.0)
Relative Index: INVALID (ref 0.0–2.5)
Total CK: 65 U/L (ref 7–177)

## 2013-02-23 LAB — URINALYSIS, ROUTINE W REFLEX MICROSCOPIC
Bilirubin Urine: NEGATIVE
Glucose, UA: 500 mg/dL — AB
Ketones, ur: NEGATIVE mg/dL
Nitrite: NEGATIVE
Protein, ur: 100 mg/dL — AB
Specific Gravity, Urine: 1.019 (ref 1.005–1.030)
Urobilinogen, UA: 1 mg/dL (ref 0.0–1.0)
pH: 6 (ref 5.0–8.0)

## 2013-02-23 LAB — POCT I-STAT TROPONIN I: Troponin i, poc: 0.05 ng/mL (ref 0.00–0.08)

## 2013-02-23 LAB — CBC
HCT: 30.4 % — ABNORMAL LOW (ref 36.0–46.0)
Hemoglobin: 10 g/dL — ABNORMAL LOW (ref 12.0–15.0)
MCH: 29.2 pg (ref 26.0–34.0)
MCHC: 32.9 g/dL (ref 30.0–36.0)
MCV: 88.9 fL (ref 78.0–100.0)
Platelets: 144 10*3/uL — ABNORMAL LOW (ref 150–400)
RBC: 3.42 MIL/uL — ABNORMAL LOW (ref 3.87–5.11)
RDW: 12.8 % (ref 11.5–15.5)
WBC: 4.6 10*3/uL (ref 4.0–10.5)

## 2013-02-23 LAB — COMPREHENSIVE METABOLIC PANEL
ALT: 12 U/L (ref 0–35)
ALT: 13 U/L (ref 0–35)
AST: 16 U/L (ref 0–37)
AST: 19 U/L (ref 0–37)
Albumin: 3 g/dL — ABNORMAL LOW (ref 3.5–5.2)
Albumin: 3.1 g/dL — ABNORMAL LOW (ref 3.5–5.2)
Alkaline Phosphatase: 51 U/L (ref 39–117)
Alkaline Phosphatase: 48 U/L (ref 39–117)
BUN: 20 mg/dL (ref 6–23)
BUN: 19 mg/dL (ref 6–23)
CO2: 25 mEq/L (ref 19–32)
CO2: 27 mEq/L (ref 19–32)
Calcium: 8.6 mg/dL (ref 8.4–10.5)
Calcium: 8.7 mg/dL (ref 8.4–10.5)
Chloride: 102 mEq/L (ref 96–112)
Chloride: 106 mEq/L (ref 96–112)
Creatinine, Ser: 1.39 mg/dL — ABNORMAL HIGH (ref 0.50–1.10)
Creatinine, Ser: 1.38 mg/dL — ABNORMAL HIGH (ref 0.50–1.10)
GFR calc Af Amer: 44 mL/min — ABNORMAL LOW (ref >90)
GFR calc Af Amer: 45 mL/min — ABNORMAL LOW (ref >90)
GFR calc non Af Amer: 38 mL/min — ABNORMAL LOW (ref >90)
GFR calc non Af Amer: 39 mL/min — ABNORMAL LOW (ref >90)
Glucose, Bld: 189 mg/dL — ABNORMAL HIGH (ref 70–99)
Glucose, Bld: 186 mg/dL — ABNORMAL HIGH (ref 70–99)
Potassium: 4 mEq/L (ref 3.5–5.1)
Potassium: 4.6 mEq/L (ref 3.5–5.1)
Sodium: 137 mEq/L (ref 135–145)
Sodium: 141 mEq/L (ref 135–145)
Total Bilirubin: 0.3 mg/dL (ref 0.3–1.2)
Total Bilirubin: 0.3 mg/dL (ref 0.3–1.2)
Total Protein: 6.1 g/dL (ref 6.0–8.3)
Total Protein: 6.1 g/dL (ref 6.0–8.3)

## 2013-02-23 LAB — CBC WITH DIFFERENTIAL/PLATELET
Basophils Absolute: 0.1 10*3/uL (ref 0.0–0.1)
Basophils Absolute: 0.1 10*3/uL (ref 0.0–0.1)
Basophils Relative: 1 % (ref 0–1)
Basophils Relative: 1 % (ref 0–1)
Eosinophils Absolute: 0.5 10*3/uL (ref 0.0–0.7)
Eosinophils Absolute: 0.4 10*3/uL (ref 0.0–0.7)
Eosinophils Relative: 9 % — ABNORMAL HIGH (ref 0–5)
Eosinophils Relative: 8 % — ABNORMAL HIGH (ref 0–5)
HCT: 31.4 % — ABNORMAL LOW (ref 36.0–46.0)
HCT: 31 % — ABNORMAL LOW (ref 36.0–46.0)
Hemoglobin: 10.5 g/dL — ABNORMAL LOW (ref 12.0–15.0)
Hemoglobin: 10.2 g/dL — ABNORMAL LOW (ref 12.0–15.0)
Lymphocytes Relative: 17 % (ref 12–46)
Lymphocytes Relative: 20 % (ref 12–46)
Lymphs Abs: 0.9 10*3/uL (ref 0.7–4.0)
Lymphs Abs: 1 10*3/uL (ref 0.7–4.0)
MCH: 29.3 pg (ref 26.0–34.0)
MCH: 29.1 pg (ref 26.0–34.0)
MCHC: 33.4 g/dL (ref 30.0–36.0)
MCHC: 32.9 g/dL (ref 30.0–36.0)
MCV: 87.7 fL (ref 78.0–100.0)
MCV: 88.3 fL (ref 78.0–100.0)
Monocytes Absolute: 0.7 10*3/uL (ref 0.1–1.0)
Monocytes Absolute: 0.5 10*3/uL (ref 0.1–1.0)
Monocytes Relative: 14 % — ABNORMAL HIGH (ref 3–12)
Monocytes Relative: 11 % (ref 3–12)
Neutro Abs: 3.1 10*3/uL (ref 1.7–7.7)
Neutro Abs: 3 10*3/uL (ref 1.7–7.7)
Neutrophils Relative %: 59 % (ref 43–77)
Neutrophils Relative %: 61 % (ref 43–77)
Platelets: 145 10*3/uL — ABNORMAL LOW (ref 150–400)
Platelets: 146 10*3/uL — ABNORMAL LOW (ref 150–400)
RBC: 3.58 MIL/uL — ABNORMAL LOW (ref 3.87–5.11)
RBC: 3.51 MIL/uL — ABNORMAL LOW (ref 3.87–5.11)
RDW: 12.5 % (ref 11.5–15.5)
RDW: 12.6 % (ref 11.5–15.5)
WBC: 5.2 10*3/uL (ref 4.0–10.5)
WBC: 4.9 10*3/uL (ref 4.0–10.5)

## 2013-02-23 LAB — URINE MICROSCOPIC-ADD ON

## 2013-02-23 LAB — LIPID PANEL
Cholesterol: 143 mg/dL (ref 0–200)
HDL: 35 mg/dL — ABNORMAL LOW (ref >39)
LDL Cholesterol: 82 mg/dL (ref 0–99)
Total CHOL/HDL Ratio: 4.1 RATIO
Triglycerides: 131 mg/dL (ref <150)
VLDL: 26 mg/dL (ref 0–40)

## 2013-02-23 LAB — TROPONIN I
Troponin I: 2.52 ng/mL (ref <0.30)
Troponin I: 4.43 ng/mL (ref <0.30)

## 2013-02-23 LAB — TSH: TSH: 2.321 u[IU]/mL (ref 0.350–4.500)

## 2013-02-23 LAB — GLUCOSE, CAPILLARY
Glucose-Capillary: 172 mg/dL — ABNORMAL HIGH (ref 70–99)
Glucose-Capillary: 132 mg/dL — ABNORMAL HIGH (ref 70–99)
Glucose-Capillary: 110 mg/dL — ABNORMAL HIGH (ref 70–99)
Glucose-Capillary: 80 mg/dL (ref 70–99)
Glucose-Capillary: 164 mg/dL — ABNORMAL HIGH (ref 70–99)
Glucose-Capillary: 173 mg/dL — ABNORMAL HIGH (ref 70–99)

## 2013-02-23 LAB — PLATELET INHIBITION P2Y12: Platelet Function  P2Y12: 316 [PRU] (ref 194–418)

## 2013-02-23 LAB — PROTIME-INR
INR: 1.05 (ref 0.00–1.49)
Prothrombin Time: 13.5 seconds (ref 11.6–15.2)

## 2013-02-23 LAB — MAGNESIUM: Magnesium: 1.8 mg/dL (ref 1.5–2.5)

## 2013-02-23 LAB — POCT ACTIVATED CLOTTING TIME: Activated Clotting Time: 150 seconds

## 2013-02-23 LAB — PRO B NATRIURETIC PEPTIDE: Pro B Natriuretic peptide (BNP): 507.4 pg/mL — ABNORMAL HIGH (ref 0–125)

## 2013-02-23 LAB — D-DIMER, QUANTITATIVE: D-Dimer, Quant: 0.35 ug/mL-FEU (ref 0.00–0.48)

## 2013-02-23 SURGERY — LEFT HEART CATHETERIZATION WITH CORONARY/GRAFT ANGIOGRAM
Anesthesia: LOCAL

## 2013-02-23 MED ORDER — SODIUM CHLORIDE 0.9 % IJ SOLN: 3.0000 mL | INTRAMUSCULAR | Status: DC | PRN

## 2013-02-23 MED ORDER — GI COCKTAIL ~~LOC~~: 30.0000 mL | Freq: Four times a day (QID) | ORAL | Status: DC | PRN

## 2013-02-23 MED ORDER — MIDAZOLAM HCL 2 MG/2ML IJ SOLN
INTRAMUSCULAR | Status: AC
  Filled 2013-02-23: qty 2

## 2013-02-23 MED ORDER — ATORVASTATIN CALCIUM 40 MG PO TABS
40.0000 mg | ORAL_TABLET | Freq: Every day | ORAL | Status: DC
  Administered 2013-02-23 – 2013-02-24 (×2): 40 mg via ORAL
  Filled 2013-02-23 (×2): qty 1

## 2013-02-23 MED ORDER — ASPIRIN EC 325 MG PO TBEC
325.0000 mg | DELAYED_RELEASE_TABLET | Freq: Every day | ORAL | Status: DC
  Administered 2013-02-23 – 2013-02-24 (×2): 325 mg via ORAL
  Filled 2013-02-23 (×2): qty 1

## 2013-02-23 MED ORDER — STUDY - INVESTIGATIONAL DRUG SIMPLE RECORD
7.5000 mg | Freq: Two times a day (BID) | Status: DC
  Administered 2013-02-23 – 2013-02-24 (×2): 7.5 mg via ORAL
  Filled 2013-02-23 (×3): qty 7.5

## 2013-02-23 MED ORDER — POTASSIUM CHLORIDE IN NACL 20-0.9 MEQ/L-% IV SOLN
INTRAVENOUS | Status: AC
  Administered 2013-02-23: 15:00:00 via INTRAVENOUS
  Filled 2013-02-23: qty 1000

## 2013-02-23 MED ORDER — LISINOPRIL 2.5 MG PO TABS
2.5000 mg | ORAL_TABLET | Freq: Every day | ORAL | Status: DC
  Administered 2013-02-23 – 2013-02-24 (×2): 2.5 mg via ORAL
  Filled 2013-02-23 (×2): qty 1

## 2013-02-23 MED ORDER — ACETAMINOPHEN 325 MG PO TABS: 650.0000 mg | ORAL_TABLET | ORAL | Status: DC | PRN

## 2013-02-23 MED ORDER — HEPARIN (PORCINE) IN NACL 100-0.45 UNIT/ML-% IJ SOLN
1100.0000 [IU]/h | INTRAMUSCULAR | Status: DC
  Administered 2013-02-23: 1100 [IU]/h via INTRAVENOUS
  Filled 2013-02-23 (×2): qty 250

## 2013-02-23 MED ORDER — ONDANSETRON HCL 4 MG/2ML IJ SOLN
4.0000 mg | Freq: Once | INTRAMUSCULAR | Status: AC
  Administered 2013-02-23: 4 mg via INTRAVENOUS
  Filled 2013-02-23: qty 2

## 2013-02-23 MED ORDER — DIAZEPAM 5 MG PO TABS
5.0000 mg | ORAL_TABLET | ORAL | Status: AC
  Administered 2013-02-23: 5 mg via ORAL
  Filled 2013-02-23: qty 1

## 2013-02-23 MED ORDER — ALPRAZOLAM 0.25 MG PO TABS: 0.2500 mg | ORAL_TABLET | Freq: Every evening | ORAL | Status: DC | PRN

## 2013-02-23 MED ORDER — ASPIRIN EC 81 MG PO TBEC
81.0000 mg | DELAYED_RELEASE_TABLET | Freq: Every day | ORAL | Status: DC
  Filled 2013-02-23: qty 1

## 2013-02-23 MED ORDER — ASPIRIN 81 MG PO CHEW
324.0000 mg | CHEWABLE_TABLET | ORAL | Status: AC
  Administered 2013-02-23: 324 mg via ORAL
  Filled 2013-02-23: qty 4

## 2013-02-23 MED ORDER — BUDESONIDE-FORMOTEROL FUMARATE 160-4.5 MCG/ACT IN AERO
2.0000 | INHALATION_SPRAY | Freq: Two times a day (BID) | RESPIRATORY_TRACT | Status: DC
Start: 2013-02-23 — End: 2013-02-24
  Administered 2013-02-23 – 2013-02-24 (×3): 2 via RESPIRATORY_TRACT
  Filled 2013-02-23: qty 6

## 2013-02-23 MED ORDER — FENTANYL CITRATE 0.05 MG/ML IJ SOLN
INTRAMUSCULAR | Status: AC
  Filled 2013-02-23: qty 2

## 2013-02-23 MED ORDER — DRONEDARONE HCL 400 MG PO TABS
400.0000 mg | ORAL_TABLET | Freq: Two times a day (BID) | ORAL | Status: DC
  Administered 2013-02-23 – 2013-02-24 (×4): 400 mg via ORAL
  Filled 2013-02-23 (×5): qty 1

## 2013-02-23 MED ORDER — INSULIN ASPART 100 UNIT/ML ~~LOC~~ SOLN
0.0000 [IU] | Freq: Three times a day (TID) | SUBCUTANEOUS | Status: DC
  Administered 2013-02-23 (×2): 3 [IU] via SUBCUTANEOUS
  Administered 2013-02-24: 2 [IU] via SUBCUTANEOUS
  Administered 2013-02-24: 3 [IU] via SUBCUTANEOUS

## 2013-02-23 MED ORDER — HEPARIN BOLUS VIA INFUSION
4000.0000 [IU] | Freq: Once | INTRAVENOUS | Status: AC
  Administered 2013-02-23: 4000 [IU] via INTRAVENOUS
  Filled 2013-02-23: qty 4000

## 2013-02-23 MED ORDER — SODIUM CHLORIDE 0.9 % IV SOLN
1.0000 mL/kg/h | INTRAVENOUS | Status: DC
  Administered 2013-02-23: 1 mL/kg/h via INTRAVENOUS

## 2013-02-23 MED ORDER — INSULIN ASPART 100 UNIT/ML ~~LOC~~ SOLN
10.0000 [IU] | Freq: Three times a day (TID) | SUBCUTANEOUS | Status: DC
  Administered 2013-02-23 – 2013-02-24 (×5): 10 [IU] via SUBCUTANEOUS

## 2013-02-23 MED ORDER — SODIUM CHLORIDE 0.9 % IV BOLUS (SEPSIS)
250.0000 mL | Freq: Once | INTRAVENOUS | Status: AC
  Administered 2013-02-23: 250 mL via INTRAVENOUS

## 2013-02-23 MED ORDER — AMLODIPINE BESYLATE 10 MG PO TABS
10.0000 mg | ORAL_TABLET | Freq: Every day | ORAL | Status: DC
  Administered 2013-02-23 – 2013-02-24 (×2): 10 mg via ORAL
  Filled 2013-02-23 (×2): qty 1

## 2013-02-23 MED ORDER — LIDOCAINE HCL (PF) 1 % IJ SOLN
INTRAMUSCULAR | Status: AC
  Filled 2013-02-23: qty 30

## 2013-02-23 MED ORDER — INSULIN GLARGINE 100 UNIT/ML ~~LOC~~ SOLN
50.0000 [IU] | Freq: Every day | SUBCUTANEOUS | Status: DC
  Administered 2013-02-23: 50 [IU] via SUBCUTANEOUS
  Filled 2013-02-23 (×2): qty 0.5

## 2013-02-23 MED ORDER — SODIUM CHLORIDE 0.9 % IJ SOLN
3.0000 mL | Freq: Two times a day (BID) | INTRAMUSCULAR | Status: DC
  Administered 2013-02-23: 3 mL via INTRAVENOUS

## 2013-02-23 MED ORDER — HEPARIN (PORCINE) IN NACL 2-0.9 UNIT/ML-% IJ SOLN
INTRAMUSCULAR | Status: AC
  Filled 2013-02-23: qty 1500

## 2013-02-23 MED ORDER — ONDANSETRON HCL 4 MG/2ML IJ SOLN: 4.0000 mg | Freq: Four times a day (QID) | INTRAMUSCULAR | Status: DC | PRN

## 2013-02-23 MED ORDER — PANTOPRAZOLE SODIUM 40 MG PO TBEC
40.0000 mg | DELAYED_RELEASE_TABLET | Freq: Every day | ORAL | Status: DC
  Administered 2013-02-23 – 2013-02-24 (×2): 40 mg via ORAL
  Filled 2013-02-23 (×2): qty 1

## 2013-02-23 MED ORDER — ALBUTEROL SULFATE HFA 108 (90 BASE) MCG/ACT IN AERS
2.0000 | INHALATION_SPRAY | Freq: Four times a day (QID) | RESPIRATORY_TRACT | Status: DC | PRN
  Filled 2013-02-23: qty 6.7

## 2013-02-23 MED ORDER — METOPROLOL TARTRATE 12.5 MG HALF TABLET
12.5000 mg | ORAL_TABLET | Freq: Two times a day (BID) | ORAL | Status: DC
  Administered 2013-02-23 – 2013-02-24 (×2): 12.5 mg via ORAL
  Filled 2013-02-23 (×4): qty 1

## 2013-02-23 MED ORDER — MORPHINE SULFATE 4 MG/ML IJ SOLN
4.0000 mg | Freq: Once | INTRAMUSCULAR | Status: AC
  Administered 2013-02-23: 4 mg via INTRAVENOUS
  Filled 2013-02-23: qty 1

## 2013-02-23 MED ORDER — SODIUM CHLORIDE 0.9 % IV BOLUS (SEPSIS)
500.0000 mL | Freq: Once | INTRAVENOUS | Status: AC
  Administered 2013-02-23: 500 mL via INTRAVENOUS

## 2013-02-23 MED ORDER — HEPARIN SODIUM (PORCINE) 5000 UNIT/ML IJ SOLN
5000.0000 [IU] | Freq: Three times a day (TID) | INTRAMUSCULAR | Status: DC
  Filled 2013-02-23 (×4): qty 1

## 2013-02-23 MED ORDER — CITALOPRAM HYDROBROMIDE 20 MG PO TABS
20.0000 mg | ORAL_TABLET | Freq: Every day | ORAL | Status: DC
  Administered 2013-02-23 – 2013-02-24 (×2): 20 mg via ORAL
  Filled 2013-02-23 (×2): qty 1

## 2013-02-23 MED ORDER — SODIUM CHLORIDE 0.9 % IV SOLN: 250.0000 mL | INTRAVENOUS | Status: DC | PRN

## 2013-02-23 MED ORDER — CLOPIDOGREL BISULFATE 75 MG PO TABS
75.0000 mg | ORAL_TABLET | Freq: Every day | ORAL | Status: DC
  Administered 2013-02-23 – 2013-02-24 (×2): 75 mg via ORAL
  Filled 2013-02-23 (×2): qty 1

## 2013-02-23 MED ORDER — INSULIN ASPART 100 UNIT/ML ~~LOC~~ SOLN: 0.0000 [IU] | Freq: Every day | SUBCUTANEOUS | Status: DC

## 2013-02-23 MED ORDER — ALBUTEROL 90 MCG/ACT IN AERS: 2.0000 | INHALATION_SPRAY | Freq: Four times a day (QID) | RESPIRATORY_TRACT | Status: DC | PRN

## 2013-02-23 MED ORDER — NITROGLYCERIN 0.2 MG/ML ON CALL CATH LAB
INTRAVENOUS | Status: AC
  Filled 2013-02-23: qty 1

## 2013-02-23 NOTE — H&P (Signed)
[facility] History and Physical  Patient: Hannah [name]  [id number]  DOB: [birth year]  DOA: 02/23/2013  Referring physician: Dr. Lemar Livings PCP: Doug Sou B  Consults: Treatment Team:  Larey Dresser, MD   Chief Complaint: Chest pain and palpitation  HPI: SOPHIAROSE REMEDIES is a 67 y.o. female with Past medical history of coronary artery disease status post CABG, aortic wall replacement, atrial fibrillation on Monistat, hypertension, cardiomyopathy, diabetes mellitus. The patient presented with complain of palpitation that started in the afternoon. She had a busy day today but denies any heavy activity during the day. She also denies any complaint of nausea vomiting diarrhea or dehydration. She says she is compliant with all the medications and does not take any diuretics. She says she was taking a shower and after that she started having the palpitation along with that she felt lethargic and tired when they check her blood pressure it was elevated in 200s with elevated heart rate in the 120. Along with that she also felt chest tightness which was located substernally nonradiating. She denies any occurrence of similar symptoms in the past. Currently she is chest pain-free denies any shortness of breath or palpitation or fever or chills or cough or leg swelling orthopnea or PND. She denies any recent travel or immobilization. She denies any recent change in her medication.  Review of Systems: as mentioned in the history of present illness.  A Comprehensive review of the other systems is negative.  Past Medical History  Diagnosis Date  . Hypertension   . Hyperlipidemia   . Atrial fibrillation     in sinus on Multaq  . CHF (congestive heart failure)     EF is low normal at 50 to 55% per echo in Jan. 2011  . Renal insufficiency, mild   . Dyspnea     chronic; followed by Dr. Lamonte Sakai  . Nonischemic cardiomyopathy   . Anemia   . Diabetes mellitus   . Iron deficiency anemia   .  High risk medication use   . CAD (coronary artery disease)     Remote CABG in 1998 with redo in 2009  . S/P AVR 2009  . Amiodarone toxicity   . Hearing loss of right ear   . Stroke    Past Surgical History  Procedure Laterality Date  . Coronary artery bypass graft  1998 & 2009    Had LIMA to DX/LAD, SVG to 2 marginal branches and SVG to [facility] originally; SVG to 3rd OM and PD at time of redo  . Aortic valve replacement  2009    #57mm [facility] Ease pericardial valve  . Cardiac catheterization  03/23/2008    OVERALL CARDIAC SIZE AND SILHOUETTE WERE NORMAL. EF IS 55%   Social History:  reports that she has never smoked. She has never used smokeless tobacco. She reports that she does not drink alcohol or use illicit drugs. Patient is coming from home. Independent for most of her  ADL.  Allergies  Allergen Reactions  . Amoxicillin Rash  . Tetracycline Rash    Family History  Problem Relation Age of Onset  . Heart disease Mother   . Heart disease Father   . Heart failure Father     Prior to Admission medications   Medication Sig Start Date End Date Taking? Authorizing Provider  albuterol (PROVENTIL,VENTOLIN) 90 MCG/ACT inhaler Inhale 2 puffs into the lungs every 6 (six) hours as needed. For shortness of breath   Yes Historical Provider, MD  ALPRAZolam (XANAX) 0.25 MG tablet Take 1 tablet (0.25 mg total) by mouth at bedtime as needed for sleep. 02/28/12  Yes Larey Dresser, MD  amLODipine (NORVASC) 10 MG tablet Take 10 mg by mouth daily.   Yes Historical Provider, MD  budesonide-formoterol (SYMBICORT) 160-4.5 MCG/ACT inhaler Inhale 2 puffs into the lungs 2 (two) times daily. 11/25/12  Yes Collene Gobble, MD  Calcium Carbonate-Vit D-Min (CALTRATE PLUS PO) Take 1 tablet by mouth daily.   Yes Historical Provider, MD  citalopram (CELEXA) 20 MG tablet Take 20 mg by mouth daily.   Yes Historical Provider, MD  clopidogrel (PLAVIX) 75 MG tablet Take 75 mg by mouth daily.   Yes Historical  Provider, MD  dronedarone (MULTAQ) 400 MG tablet Take 400 mg by mouth 2 (two) times daily with a meal.   Yes Historical Provider, MD  Garlic [id number] MG CAPS Take 1 capsule by mouth daily.   Yes Historical Provider, MD  insulin glargine (LANTUS) 100 UNIT/ML injection Inject 50 Units into the skin daily.    Yes Historical Provider, MD  insulin lispro (HUMALOG) 100 UNIT/ML injection Inject 10-20 Units into the skin See admin instructions. Per sliding scale 10-20 unit range 3 times daily   Yes Historical Provider, MD  lisinopril (PRINIVIL,ZESTRIL) 2.5 MG tablet Take 1 tablet by mouth Daily. 09/25/10  Yes Historical Provider, MD  loratadine (CLARITIN) 10 MG tablet Take 1 tablet (10 mg total) by mouth daily. 10/21/12  Yes Collene Gobble, MD  omeprazole (PRILOSEC) 40 MG capsule Take 80 mg by mouth daily.    Yes Historical Provider, MD  rosuvastatin (CRESTOR) 40 MG tablet Take 1 tablet (40 mg total) by mouth daily. 02/16/13  Yes [name], NP  traMADol (ULTRAM) 50 MG tablet Take 50 mg by mouth every 8 (eight) hours as needed.  02/13/13  Yes Historical Provider, MD    Physical Exam: Filed Vitals:   02/23/13 0230 02/23/13 0245 02/23/13 0300 02/23/13 0315  BP: 133/50 128/54    Pulse: 77 80 78 82  Temp:      TempSrc:      Resp: 23 16 14 20   Height:      Weight:      SpO2: 98% 95% 97% 98%    General: Alert, Awake and Oriented to Time, Place and Person. Appear in no distress Eyes: PERRL ENT: Oral Mucosa clear moist. Neck: No JVD, no Carotid Bruits  Cardiovascular: S1 and S2 Present, aortic systolic Murmur, Peripheral Pulses Present Respiratory: Bilateral Air entry equal and Decreased, Clear to Auscultation,  No Crackles, no wheezes Abdomen: Bowel Sound Present, Soft and Non tender Skin: No Rash Extremities: Bilateral +2 Pedal edema, no calf tenderness Neurologic: Grossly Unremarkable.  Labs on Admission:  CBC:  Recent Labs Lab 02/16/13 0852 02/23/13 0125  WBC 6.6 5.2  NEUTROABS 4.2 3.1   HGB 11.5* 10.5*  HCT 34.0* 31.4*  MCV 86.2 87.7  PLT 188.0 145*    CMP     Component Value Date/Time   NA 137 02/23/2013 0125   K 4.0 02/23/2013 0125   CL 102 02/23/2013 0125   CO2 25 02/23/2013 0125   GLUCOSE 189* 02/23/2013 0125   BUN 20 02/23/2013 0125   CREATININE 1.39* 02/23/2013 0125   CALCIUM 8.6 02/23/2013 0125   PROT 6.1 02/23/2013 0125   ALBUMIN 3.0* 02/23/2013 0125   AST 16 02/23/2013 0125   ALT 12 02/23/2013 0125   ALKPHOS 51 02/23/2013 0125   BILITOT 0.3 02/23/2013 0125  GFRNONAA 38* 02/23/2013 0125   GFRAA 44* 02/23/2013 0125    No results found for this basename: LIPASE, AMYLASE,  in the last 168 hours No results found for this basename: AMMONIA,  in the last 168 hours  Cardiac Enzymes:  Recent Labs Lab 02/23/13 0108  CKTOTAL 65  CKMB 3.3    BNP (last 3 results)  Recent Labs  02/28/12 1221 02/23/13 0108  PROBNP 128.0* 507.4*    Radiological Exams on Admission: Dg Chest 2 View  02/23/2013   CLINICAL DATA:  Chest pain  EXAM: CHEST  2 VIEW  COMPARISON:  05/12/2009.  FINDINGS: Stable cardiomegaly. Coronary artery atherosclerosis, status post CABG. Aortic valvular replacement. No edema or effusion.  No pneumothorax. No focal infiltrate.  Chronic deformity of the right humeral neck status post fracture 03/23/2010.  IMPRESSION: 1. No active cardiopulmonary disease. 2. Prior CABG and aortic valve replacement.   Electronically Signed   By: Jorje Guild   On: 02/23/2013 01:58    EKG: Independently reviewed. Initial EKG read as sinus tachycardia although she appears to have retrograde Pwave Repeat EKG sinus rhythm without ST-T changes.  Assessment/Plan Principal Problem:   Chest pain Active Problems:   HYPERLIPIDEMIA   HYPERTENSION   Atrial fibrillation   CHF (congestive heart failure)   Renal insufficiency, mild   Diabetes mellitus, type 2   Iron deficiency anemia   S/P aortic valve replacement   CAD (coronary artery disease)   1. Chest pain The  patient presents with palpitation and chest pain. The symptoms resolved with nitroglycerin and 1 L of IV fluids. At present she is chest pain-free, and not tachycardic. Considering her prior history of coronary artery disease she will be admitted to the hospital for observation. Serial troponin and telemetry monitoring. She does not appear to have any risk factors for PE and d-dimer is negative.  2. Palpitation She presented with heart rate of 120 regular. Although that is a possibility of atrial tachycardia with 2 is to 1 conduction as well. She does have history of atrial fibrillation and is on Multaq. She follows up with [facility], and may need a consultation regarding her presentation. At present I will continue her medications  3. Diabetes mellitus Continue home insulin with sliding scale  DVT Prophylaxis: subcutaneous Heparin Nutrition: Cardiac and diabetic diet  Code Status: Full  Family Communication: Son and husband was present at bedside, opportunity was given to the family to ask question and all questions were answered satisfactorily at the time of interview. Disposition: Admitted to observation telemetry.  Author: Berle Mull, MD [facility] Pager: [phone] 02/23/2013, 4:53 AM    If 7PM-7AM, please contact night-coverage [email or web address] Password [email or web address]

## 2013-02-23 NOTE — ED Notes (Signed)
Blood drawn from Hannah [name], sent to lab.

## 2013-02-23 NOTE — Progress Notes (Signed)
Patient Name: Hannah [name] Date of Encounter: 02/23/2013  Principal Problem:   Chest pain Active Problems:   HYPERLIPIDEMIA   HYPERTENSION   Atrial fibrillation   CHF (congestive heart failure)   Renal insufficiency, mild   Diabetes mellitus, type 2   Iron deficiency anemia   S/P aortic valve replacement   CAD (coronary artery disease)   SUBJECTIVE  Feels better this am, no residual chest pain  [id number] Jerilynn Mages Kaczmarczyk is a 67 y.o. female with a history of HTN, HLD, DM type II, A-fibrillation (in sinus, on Multaq), CHF, AVR, stoke, asthma, and known CAD s/p CABG 1998, 2009.  Her last cardiac catheterization was in 2009:  Pre-redo CABG: L main OK, LAD (T), CFX (T), OM1 99%, RCA (T), LIMA-LAD OK, SVG-OM(?3) OK w/ little florw to OM2, SVG-RCA OK. EF NL. Echo in 2011: EF 60-65%.  Pt. Thinks she has had a stress test since her CABG/AVR, possibly a year ago, that was negative.  She came in for a chest pain that was preceded by palpitations that started yesterday at 11 pm. She took sl NTG that gave her only partial relief.    Initial troponin 0.05, repeat troponin 2.52 at 5:35am. No ST/T changes on ECG. Creatinine 1.38.   She was found to have elevated BP and was started on NTG iv after which her pain resolved. She is currently asymptomatic, and denies any current CP, dyspnea, tachycardia, palpitations,  CURRENT MEDS . [MAR HOLD] amLODipine  10 mg Oral Daily  . [facility] HOLD] aspirin EC  325 mg Oral Daily  . [facility] HOLD] atorvastatin  40 mg Oral q1800  . [MAR HOLD] budesonide-formoterol  2 puff Inhalation BID  . [facility] HOLD] citalopram  20 mg Oral Daily  . [facility] HOLD] clopidogrel  75 mg Oral Daily  . [facility] HOLD] dronedarone  400 mg Oral BID WC  . [MAR HOLD] insulin aspart  0-15 Units Subcutaneous TID WC  . [MAR HOLD] insulin aspart  0-5 Units Subcutaneous QHS  . [MAR HOLD] insulin aspart  10 Units Subcutaneous TID WC  . [MAR HOLD] insulin glargine  50 Units Subcutaneous QHS  . [MAR HOLD]  Latitude-TIMI Study- Losmapimod/Placebo (PI- Stuckey )  7.5 mg Oral BID  . [MAR HOLD] lisinopril  2.5 mg Oral Daily  . [facility] HOLD] metoprolol tartrate  12.5 mg Oral BID  . [MAR HOLD] pantoprazole  40 mg Oral Daily  . sodium chloride  3 mL Intravenous Q12H    OBJECTIVE  Filed Vitals:   02/23/13 0315 02/23/13 0400 02/23/13 0813 02/23/13 1000  BP:  154/64  168/56  Pulse: 82 79 79 65  Temp:  98.2 F (36.8 C)    TempSrc:      Resp: 20 20 20    Height:  5' 4.5" (1.638 m)    Weight:  220 lb 11.2 oz (100.109 kg)    SpO2: 98% 99% 98%     Intake/Output Summary (Last 24 hours) at 02/23/13 1357 Last data filed at 02/23/13 0335  Gross per 24 hour  Intake   1000 ml  Output      0 ml  Net   1000 ml   Filed Weights   02/23/13 0101 02/23/13 0400  Weight: 212 lb (96.163 kg) 220 lb 11.2 oz (100.109 kg)    PHYSICAL EXAM  General: Pleasant, NAD. Neuro: Alert and oriented X 3. Moves all extremities spontaneously. Psych: Normal affect. HEENT:  Normal  Neck: Supple without bruits or JVD. Lungs:  Resp regular  and unlabored, CTA. Heart: RRR no s3, s4, or murmurs. Abdomen: Soft, non-tender, non-distended, BS + x 4.  Extremities: No clubbing, cyanosis or edema. DP/PT/Radials 2+ and equal bilaterally.  Accessory Clinical Findings  CBC  Recent Labs  02/23/13 0125 02/23/13 0535  WBC 5.2 4.9  NEUTROABS 3.1 3.0  HGB 10.5* 10.2*  HCT 31.4* 31.0*  MCV 87.7 88.3  PLT 145* 123456*   Basic Metabolic Panel  Recent Labs  02/23/13 0125 02/23/13 0535  NA 137 141  K 4.0 4.6  CL 102 106  CO2 25 27  GLUCOSE 189* 186*  BUN 20 19  CREATININE 1.39* 1.38*  CALCIUM 8.6 8.7  MG 1.8  --    Liver Function Tests  Recent Labs  02/23/13 0125 02/23/13 0535  AST 16 19  ALT 12 13  ALKPHOS 51 48  BILITOT 0.3 0.3  PROT 6.1 6.1  ALBUMIN 3.0* 3.1*   No results found for this basename: LIPASE, AMYLASE,  in the last 72 hours Cardiac Enzymes  Recent Labs  02/23/13 0108 02/23/13 0535    CKTOTAL 65  --   CKMB 3.3  --   TROPONINI  --  2.52*   BNP No components found with this basename: POCBNP,  D-Dimer  Recent Labs  02/23/13 0125  DDIMER 0.35   Hemoglobin A1C No results found for this basename: HGBA1C,  in the last 72 hours Fasting Lipid Panel  Recent Labs  02/23/13 0535  CHOL 143  HDL 35*  LDLCALC 82  TRIG 131  CHOLHDL 4.1   Thyroid Function Tests  Recent Labs  02/23/13 0535  TSH 2.321    TELE  Sinus tachycardia, no new ST-T wave abnormalities  Echo: 08/03/2011 Study Conclusions - Left ventricle: The cavity size was normal. Wall thickness was increased in a pattern of mild LVH. Systolic function was normal. The estimated ejection fraction was in the range of 60% to 65%. Regional wall motion abnormalities cannot be excluded. Doppler parameters are consistent with abnormal left ventricular relaxation (grade 1 diastolic dysfunction). - Aortic valve: A bioprosthesis was present. Trivial regurgitation. - Mitral valve: Calcified annulus. Mildly thickened leaflets . Mild regurgitation. - Left atrium: The atrium was mildly dilated. - Atrial septum: There was redundancy of the septum, with borderline criteria for aneurysm.  ECG:  02/23/2013, Sinus Tachycardia  Lexiscan Myoview: 03/04/2012 QPS   Raw Data Images: Acquisition technically good; normal left ventricular size.   Stress Images: Normal homogeneous uptake in all areas of the myocardium.   Rest Images: Normal homogeneous uptake in all areas of the myocardium.   Subtraction (SDS): No evidence of ischemia.   Transient Ischemic Dilatation (Normal <1.22): 0.99   Lung/Heart Ratio (Normal <0.45): 0.27 Overall Impression: Normal stress nuclear study.   LV Ejection Fraction: 61%. LV Wall Motion: NL LV Function; NL Wall Motion   Normal study  Radiology/Studies  Dg Chest 2 View  02/23/2013   CLINICAL DATA:  Chest pain  EXAM: CHEST  2 VIEW  COMPARISON:  05/12/2009.  FINDINGS: Stable  cardiomegaly. Coronary artery atherosclerosis, status post CABG. Aortic valvular replacement. No edema or effusion.  No pneumothorax. No focal infiltrate.  Chronic deformity of the right humeral neck status post fracture 03/23/2010.  IMPRESSION: 1. No active cardiopulmonary disease. 2. Prior CABG and aortic valve replacement.   Electronically Signed   By: Jorje Guild   On: 02/23/2013 01:58   Cardiac catheterization; 02/23/2013 Left anterior descending (LAD): Severe proximal and mid calcification with long proximal 95% stenosis. The mid  and distal vessel is small but free of high grade disease.  Left circumflex (LCx): AV groove has a mid 90% stenosis before a moderate sized MOM. OM1 and OM2 are occluded at the ostium and fill via the SVG. OM3 occluded at the ostium and fills via SVG. The grafted OMs are small and diffusely disease.  Right coronary artery (RCA): Occluded in the mid vessel. The PDA is moderate sized and occluded at the ostium. There is a 70% stenosis in the PDA after the insertion of the vein graft.  Grafts:  LIMA to LAD: Patent  SVG to RCA: Occluded (from original CABG)  SVG to RCA: Patent (from redo CABG). This is mild diffuse plaque within the vein graft.  SVG to OM3: Patent. This is mild diffuse plaque within the vein graft.  SVG sequential to OM1/OM2: Patent. There is ostial 50% stenosis. There is a patent proximal SVG stent. The native marginals are very small.  Left ventriculography: LV not injected. AVR not crossed.  Final Conclusions: Severe native vessel CAD. Patent grafts as described with nonobstructive disease in the grafts. A stent placed into the sequential SVG is patent with some disease at the ostium that on several views in not occlusive. I did compare the 2009 cath with results today. There is high grade disease in the circumflex AV groove. However, this is not changed from 2009. However, this lesion does appear to lead into a non grafted OM. If she has further  symptoms I would consider PCI of the native circumflex.     ASSESSMENT AND PLAN  67 yo female with h/o CAD, s/p CABG + AVR in 1998, 2009  1. NSTEMI - rising troponin (1. 0.05, 2. 0.65) the patient had cath by Dr Percival Spanish that severe native vessel CAD. Patent grafts with nonobstructive disease in the grafts. A stent placed into the sequential SVG is patent with some disease at the ostium that on several views in not occlusive.Compared to the 2009 cath with results today, there is high grade disease in the circumflex AV groove. However, this is not changed from 2009. However, this lesion does appear to lead into a non grafted OM. If she has further symptoms I would consider PCI of the native circumflex. For now we will treat conservatively. We will add betablocker, continue Plavix, ASA, statin, ACEI. It is possible that this was a demand precipitated by an episode of a-fib with RVR in the settings of a tight lesion in the region that hasn't been revascularized.  2. Hyperlipidemia - controlled, continue Crestor  3. Hypertension up to 154 mmHg, we will add Metoprolol 12.5 BID  4. Atrial fibrillation-- NSR.  Controlled on Multaq 400mg  BID /c meals.   5. Renal insufficiency, mild--Creatinine 1.38.  Will need to monitor kidney function closely.   6. NIDDM - glucose levels elevated on BMET to high 180s. Says DM generally well-controlled.   Ena Dawley, Lemmie Evens 02/23/2013

## 2013-02-23 NOTE — ED Provider Notes (Signed)
CSN: [id number]     Arrival date & time 02/23/13  0047 History   First MD Initiated Contact with Patient 02/23/13 0053     No chief complaint on file.  (Consider location/radiation/quality/duration/timing/severity/associated sxs/prior Treatment) HPI Hannah [name] is a very pleasant 67 yo diabetic woman with HTN, chronic atrial fibrillation, non-ischemic cardiomyopathy (EF 50-55% 2011), history of CKD, history of CAD - s/p CABG and AVR.   She presents via EMS with report of chest pain and sense of rapid heart beat.   The patient says she took a shower around 2230h and noticed, as she was getting out of the shower, that her heart seemed to be beating fast and that she was feeling generally weak. She checked pulse and it was 120. She sat down but, felt worse and rechecked pulse and it was in the 140s but regular feeling.   Patient developed centrally located chest pressure and this prompted her to call 911. Pain actually worsened en route with EMS. 8/10 at most severe. Patient received NTG en route and this helped to relieve her pain. She says she still feels "not right". Pain currently 6/10. Pt had ASA PTA.   Patient says that this pain feels "like when I have gone through a chemical stress test".  That was the last time that the patient experienced any pain similar to this. Patient feels SOB when pain is severe and pulse is elevated. She currently is not feeling SOB while laying on gurney. NO cough or fever. No changes in meds. Patient is compliant with all meds. She denies any change in chronic but mild and symmetric LE edema.   The patient says she had a check up and med refill with her cardiologist, Dr. Truitt Merle, 7 days ago.    Past Medical History  Diagnosis Date  . Hypertension   . Hyperlipidemia   . Atrial fibrillation     in sinus on Multaq  . CHF (congestive heart failure)     EF is low normal at 50 to 55% per echo in Jan. 2011  . Renal insufficiency, mild   . Dyspnea    chronic; followed by Dr. Lamonte Sakai  . Nonischemic cardiomyopathy   . Anemia   . Diabetes mellitus   . Iron deficiency anemia   . High risk medication use   . CAD (coronary artery disease)     Remote CABG in 1998 with redo in 2009  . S/P AVR 2009  . Amiodarone toxicity   . Hearing loss of right ear   . Stroke    Past Surgical History  Procedure Laterality Date  . Coronary artery bypass graft  1998 & 2009    Had LIMA to DX/LAD, SVG to 2 marginal branches and SVG to [facility] originally; SVG to 3rd OM and PD at time of redo  . Aortic valve replacement  2009    #86mm [facility] Ease pericardial valve  . Cardiac catheterization  03/23/2008    OVERALL CARDIAC SIZE AND SILHOUETTE WERE NORMAL. EF IS 55%   Family History  Problem Relation Age of Onset  . Heart disease Mother   . Heart disease Father   . Heart failure Father    History  Substance Use Topics  . Smoking status: Never Smoker   . Smokeless tobacco: Never Used  . Alcohol Use: No   OB History   Grav Para Term Preterm Abortions TAB SAB Ect Mult Living  Review of Systems 10 point ROS obtained and negative with the exception of sx noted above.   Allergies  Amoxicillin and Tetracycline  Home Medications   Current Outpatient Rx  Name  Route  Sig  Dispense  Refill  . albuterol (PROVENTIL) (2.5 MG/3ML) 0.083% nebulizer solution   Nebulization   Take 2.5 mg by nebulization every 6 (six) hours as needed. For cough         . albuterol (PROVENTIL,VENTOLIN) 90 MCG/ACT inhaler   Inhalation   Inhale 2 puffs into the lungs every 6 (six) hours as needed. For shortness of breath         . ALPRAZolam (XANAX) 0.25 MG tablet   Oral   Take 1 tablet (0.25 mg total) by mouth at bedtime as needed for sleep.   30 tablet   0     Dr Aundra Dubin will not be able to provide additional r ...   . amLODipine (NORVASC) 10 MG tablet      TAKE ONE TABLET BY MOUTH EVERY DAY   30 tablet   5   . budesonide-formoterol  (SYMBICORT) 160-4.5 MCG/ACT inhaler   Inhalation   Inhale 2 puffs into the lungs 2 (two) times daily.   1 Inhaler   6   . citalopram (CELEXA) 20 MG tablet      TAKE ONE TABLET BY MOUTH ONCE DAILY   30 tablet   11   . clopidogrel (PLAVIX) 75 MG tablet   Oral   Take 75 mg by mouth daily.         Marland Kitchen dronedarone (MULTAQ) 400 MG tablet   Oral   Take 400 mg by mouth 2 (two) times daily with a meal.         . insulin glargine (LANTUS) 100 UNIT/ML injection   Subcutaneous   Inject 50 Units into the skin daily.          . insulin lispro (HUMALOG) 100 UNIT/ML injection   Subcutaneous   Inject 30 Units into the skin 3 (three) times daily before meals.         Marland Kitchen lisinopril (PRINIVIL,ZESTRIL) 2.5 MG tablet   Oral   Take 1 tablet by mouth Daily.         Marland Kitchen loratadine (CLARITIN) 10 MG tablet   Oral   Take 1 tablet (10 mg total) by mouth daily.   30 tablet   6   . MULTAQ 400 MG tablet      TAKE ONE TABLET BY MOUTH TWICE DAILY WITH MEALS   60 tablet   2     Patient needs to schedule follow up appointment   . omeprazole (PRILOSEC) 40 MG capsule   Oral   Take 40 mg by mouth daily.         . rosuvastatin (CRESTOR) 40 MG tablet   Oral   Take 1 tablet (40 mg total) by mouth daily.   30 tablet   11   . traMADol (ULTRAM) 50 MG tablet   Oral   Take 50 mg by mouth every 8 (eight) hours as needed.           There were no vitals taken for this visit. Physical Exam Gen: well developed and well nourished appearing Head: NCAT Eyes: PERL, EOMI Nose: no epistaixis or rhinorrhea Mouth/throat: mucosa is moist and pink Neck: supple, no stridor, no jvd Lungs: CTA B, no wheezing, rhonchi or rales, RR 24 to 28/min CV: rapid and regular, pulse 120s, strong peripheral pulses,  holosystolic murmur grade 3. Abd: soft, notender, nondistended Back: no ttp, no cva ttp Skin: no rashese, wnl Neuro: CN ii-xii grossly intact, no focal deficits Ext: trace and symmetric pretibital  edema whichi s nonpitting Psyche; mildly anxious  affect,  calm and cooperative.   ED Course  Procedures (including critical care time)  EKG: sinus tach with rate of 123 bpm, normal axis, no acute ischemic changes identified, prolonged QTc of 508, otherwise normal QRS, resolution of diffuse t wave flattening which is noted on comparison EKG from Sept 8, 2014.   Results for orders placed during the hospital encounter of 02/23/13 (from the past 24 hour(s))  PRO B NATRIURETIC PEPTIDE     Status: Abnormal   Collection Time    02/23/13  1:08 AM      Result Value Range   Pro B Natriuretic peptide (BNP) 507.4 (*) 0 - 125 pg/mL  CK TOTAL AND CKMB     Status: None   Collection Time    02/23/13  1:08 AM      Result Value Range   Total CK 65  7 - 177 U/L   CK, MB 3.3  0.3 - 4.0 ng/mL   Relative Index RELATIVE INDEX IS INVALID  0.0 - 2.5  URINALYSIS, ROUTINE W REFLEX MICROSCOPIC     Status: Abnormal   Collection Time    02/23/13  1:10 AM      Result Value Range   Color, Urine YELLOW  YELLOW   APPearance CLEAR  CLEAR   Specific Gravity, Urine 1.019  1.005 - 1.030   pH 6.0  5.0 - 8.0   Glucose, UA 500 (*) NEGATIVE mg/dL   Hgb urine dipstick TRACE (*) NEGATIVE   Bilirubin Urine NEGATIVE  NEGATIVE   Ketones, ur NEGATIVE  NEGATIVE mg/dL   Protein, ur 100 (*) NEGATIVE mg/dL   Urobilinogen, UA 1.0  0.0 - 1.0 mg/dL   Nitrite NEGATIVE  NEGATIVE   Leukocytes, UA SMALL (*) NEGATIVE  URINE MICROSCOPIC-ADD ON     Status: Abnormal   Collection Time    02/23/13  1:10 AM      Result Value Range   Squamous Epithelial / LPF FEW (*) RARE   WBC, UA 7-10  <3 WBC/hpf   RBC / HPF 0-2  <3 RBC/hpf   Bacteria, UA FEW (*) RARE  CBC WITH DIFFERENTIAL     Status: Abnormal   Collection Time    02/23/13  1:25 AM      Result Value Range   WBC 5.2  4.0 - 10.5 K/uL   RBC 3.58 (*) 3.87 - 5.11 MIL/uL   Hemoglobin 10.5 (*) 12.0 - 15.0 g/dL   HCT 31.4 (*) 36.0 - 46.0 %   MCV 87.7  78.0 - 100.0 fL   MCH 29.3   26.0 - 34.0 pg   MCHC 33.4  30.0 - 36.0 g/dL   RDW 12.5  11.5 - 15.5 %   Platelets 145 (*) 150 - 400 K/uL   Neutrophils Relative % 59  43 - 77 %   Neutro Abs 3.1  1.7 - 7.7 K/uL   Lymphocytes Relative 17  12 - 46 %   Lymphs Abs 0.9  0.7 - 4.0 K/uL   Monocytes Relative 14 (*) 3 - 12 %   Monocytes Absolute 0.7  0.1 - 1.0 K/uL   Eosinophils Relative 9 (*) 0 - 5 %   Eosinophils Absolute 0.5  0.0 - 0.7 K/uL   Basophils Relative 1  0 - 1 %   Basophils Absolute 0.1  0.0 - 0.1 K/uL  COMPREHENSIVE METABOLIC PANEL     Status: Abnormal   Collection Time    02/23/13  1:25 AM      Result Value Range   Sodium 137  135 - 145 mEq/L   Potassium 4.0  3.5 - 5.1 mEq/L   Chloride 102  96 - 112 mEq/L   CO2 25  19 - 32 mEq/L   Glucose, Bld 189 (*) 70 - 99 mg/dL   BUN 20  6 - 23 mg/dL   Creatinine, Ser 1.39 (*) 0.50 - 1.10 mg/dL   Calcium 8.6  8.4 - 10.5 mg/dL   Total Protein 6.1  6.0 - 8.3 g/dL   Albumin 3.0 (*) 3.5 - 5.2 g/dL   AST 16  0 - 37 U/L   ALT 12  0 - 35 U/L   Alkaline Phosphatase 51  39 - 117 U/L   Total Bilirubin 0.3  0.3 - 1.2 mg/dL   GFR calc non Af Amer 38 (*) >90 mL/min   GFR calc Af Amer 44 (*) >90 mL/min  PROTIME-INR     Status: None   Collection Time    02/23/13  1:25 AM      Result Value Range   Prothrombin Time 13.5  11.6 - 15.2 seconds   INR 1.05  0.00 - 1.49  MAGNESIUM     Status: None   Collection Time    02/23/13  1:25 AM      Result Value Range   Magnesium 1.8  1.5 - 2.5 mg/dL  D-DIMER, QUANTITATIVE     Status: None   Collection Time    02/23/13  1:25 AM      Result Value Range   D-Dimer, Quant 0.35  0.00 - 0.48 ug/mL-FEU  POCT I-STAT TROPONIN I     Status: None   Collection Time    02/23/13  1:34 AM      Result Value Range   Troponin i, poc 0.05  0.00 - 0.08 ng/mL   Comment 3            DG Chest 2 View (Final result)  Result time: 02/23/13 01:58:56    Final result by Rad Results In Interface (02/23/13 01:58:56)    Narrative:   CLINICAL DATA: Chest  pain  EXAM: CHEST 2 VIEW  COMPARISON: 05/12/2009.  FINDINGS: Stable cardiomegaly. Coronary artery atherosclerosis, status post CABG. Aortic valvular replacement. No edema or effusion.  No pneumothorax. No focal infiltrate.  Chronic deformity of the right humeral neck status post fracture 03/23/2010.  IMPRESSION: 1. No active cardiopulmonary disease. 2. Prior CABG and aortic valve replacement.   Electronically Signed By: Jorje Guild On: 02/23/2013 01:58    MDM  DDX: ACS, PE, anemia, dehydration, electrolyte disturbance, pneumothorax, pneumonia, pericardial or pleural effusion, gastritis, GERD/PUD, musculoskeletal pain.   We have ruled out PE effectively with negative d dimer in this low pretest prob patient with Well's score of 0.  CXR is negative for active cardiopulmonary process. The patient's tachycardia resolved after tx with 538mL of NS here in the ED. I have since been advised that the patient was also treated with 500 ML of NS en route by paramedics.   Patient is chest pain free after MS. Sx free at this time with pulse in 84.  EKG reviewed with Dr. Radford Pax. Case discussed with Dr. Posey Pronto who will see and admit to the Tele unit.    Almyra Free  Cheri Guppy, MD 02/23/13 [phone]

## 2013-02-23 NOTE — Progress Notes (Signed)
Unclear if Heparin gtt is to be restarted (post cath).  Rosaria Ferries, PA was paged and she stated we should restart Heparin 6 hours after sheath pulled (restart at 8pm) unless otherwise ordered by MD.  This was clarified with Pharmacist Hedy Camara) on the unit.

## 2013-02-23 NOTE — ED Notes (Addendum)
Pt here by EMS from home, here by EMS with husband, here for chest pressure, also reports sob & nausea. H/o DM, htn, valve replacement and CABG. ST on monitor, HR 124. ntg 1 SL, zofran 4mg  IV, and ASA 324 PO given. NSL in place. Pt arrives alert, NAD, calm, interactive, "feeling better", skin W&D, resps e/u, speaking in clear complete sentences, "unable to use pain scale", pain gone, some pressure remains, sob and nausea resolved. Pt up & ambulatory to b/r on arrival, Dr. Cheri Guppy at [facility] on arrival.  EMS 12 lead EKG "unremarkable".

## 2013-02-23 NOTE — Progress Notes (Signed)
CRITICAL VALUE ALERT  Critical value received:  Troponin 2.52  Date of notification:  02/23/2013  Time of notification:  08:02         Critical value read back: yes  Nurse who received alert:  Neta Ehlers, RN   MD notified (1st page):  Dr. Sloan Leiter (verbal notification; MD on unit)  Time of first page:  08:02  MD notified (2nd page):  Time of second page:  Responding MD:  Dr. Sloan Leiter  Time MD responded:  08:02

## 2013-02-23 NOTE — Progress Notes (Signed)
UR Completed Kersti Scavone Graves-Bigelow, RN,BSN [phone]  

## 2013-02-23 NOTE — ED Notes (Signed)
Back from xray, no changes, alert, NAD< calm, interactive, family at [facility] x2.

## 2013-02-23 NOTE — CV Procedure (Signed)
  Cardiac Catheterization Procedure Note  Name: Hannah [name] MRN: [id number] DOB: 1945-10-07  Procedure: Left Heart Cath, Selective Coronary Angiography, LV angiography  Indication:    NSTEMI  Procedural details: The right groin was prepped, draped, and anesthetized with 1% lidocaine. Using modified Seldinger technique, a 5 French sheath was introduced into the right femoral artery. Standard Judkins catheters were used for coronary angiography and left ventriculography. Catheter exchanges were performed over a guidewire. There were no immediate procedural complications. The patient was transferred to the post catheterization recovery area for further monitoring.  Procedural Findings:   Hemodynamics:     AO 103/60    LV  NA   Coronary angiography:   Coronary dominance: Right  Left mainstem:   Normal  Left anterior descending (LAD):   Severe proximal and mid calcification with long proximal 95% stenosis.  The mid and distal vessel is small but free of high grade disease.   Left circumflex (LCx):  AV groove has a mid 90% stenosis before a moderate sized MOM. OM1 and OM2 are occluded at the ostium and fill via the SVG.   OM3 occluded at the ostium and fills via SVG.  The grafted OMs are small and diffusely disease.    Right coronary artery (RCA):  Occluded in the mid vessel.  The PDA is moderate sized and occluded at the ostium.  There is a 70% stenosis in the PDA after the insertion of the vein graft.    Grafts:    LIMA to LAD:  Patent  SVG to RCA:  Occluded (from original CABG)  SVG to RCA:  Patent (from redo CABG).  This is mild diffuse plaque within the vein graft.  SVG to OM3:  Patent.  This is mild diffuse plaque within the vein graft.  SVG sequential to OM1/OM2:  Patent.  There is ostial 50% stenosis.  There is a patent proximal SVG stent.  The native marginals are very small.  Left ventriculography:  LV not injected.  AVR not crossed.    Final Conclusions:  Severe  native vessel CAD.  Patent grafts as described with nonobstructive disease in the grafts.  A stent placed into the sequential SVG is patent with some disease at the ostium that on several views in not occlusive.  I did compare the 2009 cath with results today.  There is high grade disease in the circumflex AV groove.  However, this is not changed from 2009.  However, this lesion does appear to lead into a non grafted OM.  If she has further symptoms I would consider PCI of the native circumflex.    Recommendations:   Minus Breeding 02/23/2013, 1:10 PM

## 2013-02-23 NOTE — Research (Signed)
[place] Informed Consent   Subject Name: Hannah [name]  Subject met inclusion and exclusion criteria.  The informed consent form, study requirements and expectations were reviewed with the subject and questions and concerns were addressed prior to the signing of the consent form.  The subject verbalized understanding of the trial requirements.  The subject agreed to participate in the LATITU[place] trial and signed the informed consent.  The informed consent was obtained prior to performance of any protocol-specific procedures for the subject.  A copy of the signed informed consent was given to the subject and a copy was placed in the subject's medical record.  Berneda Rose 02/23/2013, 10:20 AM

## 2013-02-23 NOTE — H&P (View-Only) (Signed)
CARDIOLOGY CONSULT NOTE   Patient ID: Hannah [name] MRN: [id number] DOB/AGE: 67-Feb-1947 67 y.o.  Admit date: 02/23/2013  Primary Physician   Nicola Girt Primary Cardiologist   DM Reason for Consultation   Chest pain, NSTEMI  [id number] Hannah [name] is a 67 y.o. female with a history of HTN, HLD, DM type II, A-fibrillation (in sinus, on Multaq), CHF, AVR, stoke, asthma, and known CAD s/p CABG 1998, 2009.  Her last cardiac catheterization was in 2009:  Pre-redo CABG: L main OK, LAD (T), CFX (T), OM1 99%, RCA (T), LIMA-LAD OK, SVG-OM(?3) OK w/ little florw to OM2, SVG-RCA OK. EF NL. Echo in 2011: EF 60-65%.  Pt. Thinks she has had a stress test since her CABG/AVR, but is unsure of the date. Initial troponin 0.05, repeat troponin 2.52 at 5:35am. No ST/T changes on ECG.  Creatinine 1.38.   Hannah [name] is a very pleasant 67 y/o obese wf who presented to the ED early on 9/15 /c c/o SSCP.  She states that at 11pm on 9/14 she was getting up from a chair after watching tv to get ready for bed when she suddenly felt SSCP that felt like "something was sitting on my chest."  The pain did not radiate anywhere, it was constant, and it did not improve with rest.  She also reports dyspnea and troubled breathing, tachycardia, palpitations, and nausea /s vomiting. She denies any lightheadedness, wheezing, cough, vomiting, diaphoresis, increased swelling, fever or chills. She thought she could wait for the pain to resolve on its own by resting, but after 10-15' she and her husband called EMS.  She did not take any medications prior to EMS arrival.  While waiting for EMS, she reports that she checked her BP and HR which were 210/100 and 148 respectively.   Upon EMS arrival, her SSCP had worsened and was more intense.  She was given an ASA and NTG en route to the ED which helped to decrease her pain.  She was given O2 via nasal canula and zofran in the ED, which helped to resolve her dyspnea and nausea. She reports that  her pain and associated sx did not resolve completely until around 2am in the ED; she only took one NTG.   This morning she is pleasant and reports feeling much better than earlier this morning. She denies any current CP, dyspnea, tachycardia, palpitations, cough, N/V, diaphoresis, increased swelling, fever or chills. She states that she feels "back to normal now."  She explains that she has not had an episode of CP in "a very long time," and is unable to define the length but thinks it may have been in 2009 prior to when she had CABG/ AVR.  She reports moderate activity at home normally, but she does have some difficulty with climbing stairs due to dyspnea. She also had chronic leg swelling that comes and goes each day.  She denies orthopnea and PND.  She denies tobacco, alcohol, or illicit drug use. She also reports compliance with her medications, and denies any recent changes to her medications.  She saw Truitt Merle, NP in the office one week ago.    Past Medical History  Diagnosis Date  . Hypertension   . Hyperlipidemia   . Atrial fibrillation     in sinus on Multaq  . CHF (congestive heart failure)     EF is low normal at 50 to 55% per echo in Jan. 2011  . Renal insufficiency, mild   .  Dyspnea     chronic; followed by Dr. Lamonte Sakai  . Nonischemic cardiomyopathy   . Anemia   . Diabetes mellitus   . Iron deficiency anemia   . High risk medication use   . CAD (coronary artery disease)     Remote CABG in 1998 with redo in 2009  . S/P AVR 2009  . Amiodarone toxicity   . Hearing loss of right ear   . Stroke      Past Surgical History  Procedure Laterality Date  . Coronary artery bypass graft  1998 & 2009    Had LIMA to DX/LAD, SVG to 2 marginal branches and SVG to [facility] originally; SVG to 3rd OM and PD at time of redo  . Aortic valve replacement  2009    #59mm [facility] Ease pericardial valve  . Cardiac catheterization  03/23/2008    Pre-redo CABG: L main OK, LAD (T), CFX (T), OM1  99%, RCA (T), LIMA-LAD OK, SVG-OM(?3) OK w/ little florw to OM2, SVG-RCA OK. EF NL    Allergies  Allergen Reactions  . Amoxicillin Rash  . Tetracycline Rash   I have reviewed the patient's current medications . amLODipine  10 mg Oral Daily  . aspirin EC  325 mg Oral Daily  . atorvastatin  40 mg Oral q1800  . budesonide-formoterol  2 puff Inhalation BID  . citalopram  20 mg Oral Daily  . clopidogrel  75 mg Oral Daily  . dronedarone  400 mg Oral BID WC  . insulin aspart  0-15 Units Subcutaneous TID WC  . insulin aspart  0-5 Units Subcutaneous QHS  . insulin aspart  10 Units Subcutaneous TID WC  . insulin glargine  50 Units Subcutaneous QHS  . lisinopril  2.5 mg Oral Daily  . pantoprazole  40 mg Oral Daily     acetaminophen, albuterol, ALPRAZolam, gi cocktail, ondansetron (ZOFRAN) IV  Prior to Admission medications   Medication Sig Start Date End Date Taking? Authorizing Provider  albuterol (PROVENTIL,VENTOLIN) 90 MCG/ACT inhaler Inhale 2 puffs into the lungs every 6 (six) hours as needed. For shortness of breath   Yes Historical Provider, MD  ALPRAZolam (XANAX) 0.25 MG tablet Take 1 tablet (0.25 mg total) by mouth at bedtime as needed for sleep. 02/28/12  Yes Larey Dresser, MD  amLODipine (NORVASC) 10 MG tablet Take 10 mg by mouth daily.   Yes Historical Provider, MD  budesonide-formoterol (SYMBICORT) 160-4.5 MCG/ACT inhaler Inhale 2 puffs into the lungs 2 (two) times daily. 11/25/12  Yes Collene Gobble, MD  Calcium Carbonate-Vit D-Min (CALTRATE PLUS PO) Take 1 tablet by mouth daily.   Yes Historical Provider, MD  citalopram (CELEXA) 20 MG tablet Take 20 mg by mouth daily.   Yes Historical Provider, MD  clopidogrel (PLAVIX) 75 MG tablet Take 75 mg by mouth daily.   Yes Historical Provider, MD  dronedarone (MULTAQ) 400 MG tablet Take 400 mg by mouth 2 (two) times daily with a meal.   Yes Historical Provider, MD  Garlic [id number] MG CAPS Take 1 capsule by mouth daily.   Yes Historical  Provider, MD  insulin glargine (LANTUS) 100 UNIT/ML injection Inject 50 Units into the skin daily.    Yes Historical Provider, MD  insulin lispro (HUMALOG) 100 UNIT/ML injection Inject 10-20 Units into the skin See admin instructions. Per sliding scale 10-20 unit range 3 times daily   Yes Historical Provider, MD  lisinopril (PRINIVIL,ZESTRIL) 2.5 MG tablet Take 1 tablet by mouth Daily. 09/25/10  Yes  Historical Provider, MD  loratadine (CLARITIN) 10 MG tablet Take 1 tablet (10 mg total) by mouth daily. 10/21/12  Yes Collene Gobble, MD  omeprazole (PRILOSEC) 40 MG capsule Take 80 mg by mouth daily.    Yes Historical Provider, MD  rosuvastatin (CRESTOR) 40 MG tablet Take 1 tablet (40 mg total) by mouth daily. 02/16/13  Yes [name], NP  traMADol (ULTRAM) 50 MG tablet Take 50 mg by mouth every 8 (eight) hours as needed.  02/13/13  Yes Historical Provider, MD     History   Social History  . Marital Status: Married    Spouse Name: N/A    Number of Children: N/A  . Years of Education: N/A   Occupational History  . Not on file.   Social History Main Topics  . Smoking status: Never Smoker   . Smokeless tobacco: Never Used  . Alcohol Use: No  . Drug Use: No  . Sexual Activity: Yes   Other Topics Concern  . Not on file   Social History Narrative  . No narrative on file    Family Status  Relation Status Death Age  . Mother Deceased   . Father Deceased    Family History  Problem Relation Age of Onset  . Heart disease Mother   . Heart disease Father   . Heart failure Father      ROS:  Full 14 point review of systems complete and found to be negative unless listed above.  Physical Exam: Blood pressure 154/64, pulse 79, temperature 98.2 F (36.8 C), temperature source Oral, resp. rate 20, height 5' 4.5" (1.638 m), weight 220 lb 11.2 oz (100.109 kg), SpO2 98.00%.  General: Well developed, well nourished, female in no acute distress Head:  Normocephalic and atraumatic, oropharynx  without edema or exudate Lungs: CTAB Heart: HRRR S1 S2 (mechanical S2), no rub/gallop, no murmur. pulses are 2+ upper extrem. LE pulses difficult to palpate secondary to edema.  Neck: Moderate, bilateral carotid bruits. No lymphadenopathy.  Unable to assess JVD due to body habitus. Abdomen: Bowel sounds present, abdomen soft and non-tender without masses or hernias noted. Extremities: No clubbing or cyanosis.  2+ pitting edema bilaterally to mid calf.  Neuro: Alert and oriented X 3. No focal deficits noted. Psych:  Good affect, responds appropriately Skin: No rashes or lesions noted.  Labs:   Lab Results  Component Value Date   WBC 4.9 02/23/2013   HGB 10.2* 02/23/2013   HCT 31.0* 02/23/2013   MCV 88.3 02/23/2013   PLT 146* 02/23/2013    Recent Labs  02/23/13 0125  INR 1.05     Recent Labs Lab 02/23/13 0535  NA 141  K 4.6  CL 106  CO2 27  BUN 19  CREATININE 1.38*  CALCIUM 8.7  PROT 6.1  BILITOT 0.3  ALKPHOS 48  ALT 13  AST 19  GLUCOSE 186*   Magnesium  Date Value Range Status  02/23/2013 1.8  1.5 - 2.5 mg/dL Final    Recent Labs  02/23/13 0108 02/23/13 0535  CKTOTAL 65  --   CKMB 3.3  --   TROPONINI  --  2.52*    Recent Labs  02/23/13 0134  TROPIPOC 0.05   Pro B Natriuretic peptide (BNP)  Date/Time Value Range Status  02/23/2013  1:08 AM 507.4* 0 - 125 pg/mL Final  02/28/2012 12:21 PM 128.0* 0.0 - 100.0 pg/mL Final   Lab Results  Component Value Date   CHOL 143 02/23/2013  HDL 35* 02/23/2013   LDLCALC 82 02/23/2013   TRIG 131 02/23/2013   Lab Results  Component Value Date   DDIMER 0.35 02/23/2013   TSH  Date/Time Value Range Status  02/16/2013  8:52 AM 1.72  0.35 - 5.50 uIU/mL Final    Echo: 08/03/2011 Study Conclusions - Left ventricle: The cavity size was normal. Wall thickness was increased in a pattern of mild LVH. Systolic function was normal. The estimated ejection fraction was in the range of 60% to 65%. Regional wall motion  abnormalities cannot be excluded. Doppler parameters are consistent with abnormal left ventricular relaxation (grade 1 diastolic dysfunction). - Aortic valve: A bioprosthesis was present. Trivial regurgitation. - Mitral valve: Calcified annulus. Mildly thickened leaflets . Mild regurgitation. - Left atrium: The atrium was mildly dilated. - Atrial septum: There was redundancy of the septum, with borderline criteria for aneurysm.  ECG:  02/23/2013 Tachycardia Vent. rate 123 BPM PR interval * ms QRS duration 84 ms QT/QTc 355/508 ms P-R-T axes * 23 172  Lexiscan Myoview: 03/04/2012 QPS  Raw Data Images: Acquisition technically good; normal left ventricular size.  Stress Images: Normal homogeneous uptake in all areas of the myocardium.  Rest Images: Normal homogeneous uptake in all areas of the myocardium.  Subtraction (SDS): No evidence of ischemia.  Transient Ischemic Dilatation (Normal <1.22): 0.99  Lung/Heart Ratio (Normal <0.45): 0.27 Overall Impression: Normal stress nuclear study.  LV Ejection Fraction: 61%. LV Wall Motion: NL LV Function; NL Wall Motion  Normal study  Radiology:  Dg Chest 2 View 02/23/2013   CLINICAL DATA:  Chest pain  EXAM: CHEST  2 VIEW  COMPARISON:  05/12/2009.  FINDINGS: Stable cardiomegaly. Coronary artery atherosclerosis, status post CABG. Aortic valvular replacement. No edema or effusion.  No pneumothorax. No focal infiltrate.  Chronic deformity of the right humeral neck status post fracture 03/23/2010.  IMPRESSION: 1. No active cardiopulmonary disease. 2. Prior CABG and aortic valve replacement.   Electronically Signed   By: Jorje Guild   On: 02/23/2013 01:58    ASSESSMENT AND PLAN:   The patient was seen today by Dr. Meda Coffee, the patient evaluated and the data reviewed.   Principal Problem:   Chest pain-- Pain and associated symptoms have resolved, but repeat troponin elevated to 2.52 from 0.05.  Will need to cycle enzymes to follow trend.  No  ST/T changes on ECG.  Pt. Will likely need cardiac catheterization given her hx and risk factors: HTN, HLD, obesity, DM type II.  Given her mild renal insufficiency, will hydrate pt to prep for possible cath. HR never low, telemetry did not pick up accurately at times. Will add BB, try 12.5 metoprolol BID. She is on ASA/statin. Check echo.   Active Problems:   HYPERLIPIDEMIA--LDL <100.  Good lipid control on Crestor.    HYPERTENSION-- SBP 120-154 mmHg, DBP 50-64 mmHg. She is taking Norvasc 10mg  QD at home. This could explain some of her LE swelling. BP seems to be moderately well controlled.     Atrial fibrillation-- NSR.  Controlled on Multaq 400mg  BID /c meals.     Renal insufficiency, mild--Creatinine 1.38.  Will need to monitor kidney function closely.     Diabetes mellitus, type 2-- glucose levels elevated on BMET to high 180s. Says DM generally well-controlled.      Palpitations - consider event monitor as she may have had recurrence of afib (may even have precipitated MI).    Signed: Lanice Shirts, PA-S 02/23/2013   Rosaria Ferries, PA-C 02/23/2013  8:26 AM Beeper [id number]  Co-Sign MD

## 2013-02-23 NOTE — Progress Notes (Signed)
Spoke with Rosaria Ferries PA-C [place] [facility]-patient will be transferred to Cardiology service. [facility] will sign off.

## 2013-02-23 NOTE — Progress Notes (Signed)
PATIENT DETAILS Name: Hannah [name] Age: 67 y.o. Sex: female Date of Birth: 1946/05/03 Admit Date: 02/23/2013 Admitting Physician Berle Mull, MD [id number], Barbarann Ehlers  Subjective: No major complaints  Assessment/Plan: Principal Problem:   NSTEMI -admitted with palpitations and chest pain-no chest pain this am -on ASA/Plavix/Statins/Metoprolol/Heparin gtt -Cards consult called -has hx of CAD-s/p CABG  Active Problems: Palpitations -suspect Afib-given her prior hx of Afib -suspect needs to be on anti-coagulation long term-will defer to cards -currently on IV heparin gtt  Hx of Afib -on Multaq -suspect needs long term anticoagulation-hx of DM, HTN and prior CVA -currentl in heparin gtt    HYPERLIPIDEMIA -c/w statins    HYPERTENSION -controlled -c/w Metoprolol, Lisinopril and Amlodipine    CHF (congestive heart failure) -suspect chronic diastolic CHF -EF 50 to [id number] per echo in Jan. 2011  -clinically compensated at present    Renal insufficiency, mild -hydrate and recheck lytes in am    Diabetes mellitus, type 2 -CBG's stable-c/w Lantus. novolog and SSI   Hx of Asthma -lungs clear -stable -prn albuterol inhaler -c/w Budesonide    CAD (coronary artery disease) -as above -s/p CABG in 1998, Redo CABG 2009  Hx of Aortic Valve Replacement -AVR in 2009  Hx of CVA -Small pontine CVA on MRI in summer 2013  Obesity -counseled regarding weight loss  Disposition: Remain inpatient  DVT Prophylaxis: On heparin gtt  Code Status: Full code   Family Communication Husband at bedside  Procedures:  None  CONSULTS:  cardiology   MEDICATIONS: Scheduled Meds: . amLODipine  10 mg Oral Daily  . aspirin  324 mg Oral Pre-Cath  . aspirin EC  325 mg Oral Daily  . atorvastatin  40 mg Oral q1800  . budesonide-formoterol  2 puff Inhalation BID  . citalopram  20 mg Oral Daily  . clopidogrel  75 mg Oral Daily  . diazepam  5 mg Oral On Call  . dronedarone   400 mg Oral BID WC  . insulin aspart  0-15 Units Subcutaneous TID WC  . insulin aspart  0-5 Units Subcutaneous QHS  . insulin aspart  10 Units Subcutaneous TID WC  . insulin glargine  50 Units Subcutaneous QHS  . lisinopril  2.5 mg Oral Daily  . metoprolol tartrate  12.5 mg Oral BID  . pantoprazole  40 mg Oral Daily  . sodium chloride  3 mL Intravenous Q12H   Continuous Infusions: . sodium chloride    . heparin 1,100 Units/hr (02/23/13 0952)   PRN Meds:.sodium chloride, acetaminophen, albuterol, ALPRAZolam, gi cocktail, ondansetron (ZOFRAN) IV, sodium chloride  Antibiotics: Anti-infectives   None       PHYSICAL EXAM: Vital signs in last 24 hours: Filed Vitals:   02/23/13 0315 02/23/13 0400 02/23/13 0813 02/23/13 1000  BP:  154/64  168/56  Pulse: 82 79 79 65  Temp:  98.2 F (36.8 C)    TempSrc:      Resp: 20 20 20    Height:  5' 4.5" (1.638 m)    Weight:  100.109 kg (220 lb 11.2 oz)    SpO2: 98% 99% 98%     Weight change:  Filed Weights   02/23/13 0101 02/23/13 0400  Weight: 96.163 kg (212 lb) 100.109 kg (220 lb 11.2 oz)   Body mass index is 37.31 kg/(m^2).   Gen Exam: Awake and alert with clear speech.   Neck: Supple, No JVD.   Chest: B/L Clear.   CVS: S1 S2 Regular, no murmurs.  Abdomen: soft, BS +,  non tender, non distended.  Extremities: no edema, lower extremities warm to touch. Neurologic: Non Focal.   Skin: No Rash.   Wounds: N/A.    Intake/Output from previous day:  Intake/Output Summary (Last 24 hours) at 02/23/13 1044 Last data filed at 02/23/13 0335  Gross per 24 hour  Intake   1000 ml  Output      0 ml  Net   1000 ml     LAB RESULTS: CBC  Recent Labs Lab 02/23/13 0125 02/23/13 0535  WBC 5.2 4.9  HGB 10.5* 10.2*  HCT 31.4* 31.0*  PLT 145* 146*  MCV 87.7 88.3  MCH 29.3 29.1  MCHC 33.4 32.9  RDW 12.5 12.6  LYMPHSABS 0.9 1.0  MONOABS 0.7 0.5  EOSABS 0.5 0.4  BASOSABS 0.1 0.1    Chemistries   Recent Labs Lab 02/23/13 0125  02/23/13 0535  NA 137 141  K 4.0 4.6  CL 102 106  CO2 25 27  GLUCOSE 189* 186*  BUN 20 19  CREATININE 1.39* 1.38*  CALCIUM 8.6 8.7  MG 1.8  --     CBG:  Recent Labs Lab 02/23/13 0759  GLUCAP 172*    GFR Estimated Creatinine Clearance: 46 ml/min (by C-G formula based on Cr of 1.38).  Coagulation profile  Recent Labs Lab 02/23/13 0125  INR 1.05    Cardiac Enzymes  Recent Labs Lab 02/23/13 0108 02/23/13 0535  CKMB 3.3  --   TROPONINI  --  2.52*    No components found with this basename: POCBNP,   Recent Labs  02/23/13 0125  DDIMER 0.35   No results found for this basename: HGBA1C,  in the last 72 hours  Recent Labs  02/23/13 0535  CHOL 143  HDL 35*  LDLCALC 82  TRIG 131  CHOLHDL 4.1   No results found for this basename: TSH, T4TOTAL, FREET3, T3FREE, THYROIDAB,  in the last 72 hours No results found for this basename: VITAMINB12, FOLATE, FERRITIN, TIBC, IRON, RETICCTPCT,  in the last 72 hours No results found for this basename: LIPASE, AMYLASE,  in the last 72 hours  Urine Studies No results found for this basename: UACOL, UAPR, USPG, UPH, UTP, UGL, UKET, UBIL, UHGB, UNIT, UROB, ULEU, UEPI, UWBC, URBC, UBAC, CAST, CRYS, UCOM, BILUA,  in the last 72 hours  MICROBIOLOGY: No results found for this or any previous visit (from the past 240 hour(s)).  RADIOLOGY STUDIES/RESULTS: Dg Chest 2 View  02/23/2013   CLINICAL DATA:  Chest pain  EXAM: CHEST  2 VIEW  COMPARISON:  05/12/2009.  FINDINGS: Stable cardiomegaly. Coronary artery atherosclerosis, status post CABG. Aortic valvular replacement. No edema or effusion.  No pneumothorax. No focal infiltrate.  Chronic deformity of the right humeral neck status post fracture 03/23/2010.  IMPRESSION: 1. No active cardiopulmonary disease. 2. Prior CABG and aortic valve replacement.   Electronically Signed   By: Jorje Guild   On: 02/23/2013 01:58    Oren Binet, MD  [facility] Pager:[phone]  [phone]  If 7PM-7AM, please contact night-coverage [email or web address] Password [email or web address] 02/23/2013, 10:44 AM   LOS: 0 days

## 2013-02-23 NOTE — Consult Note (Signed)
CARDIOLOGY CONSULT NOTE   Patient ID: Hannah [name] MRN: [id number] DOB/AGE: 67/24/1947 67 y.o.  Admit date: 02/23/2013  Primary Physician   Nicola Girt Primary Cardiologist   DM Reason for Consultation   Chest pain, NSTEMI  [id number] Hannah [name] is a 67 y.o. female with a history of HTN, HLD, DM type II, A-fibrillation (in sinus, on Multaq), CHF, AVR, stoke, asthma, and known CAD s/p CABG 1998, 2009.  Her last cardiac catheterization was in 2009:  Pre-redo CABG: L main OK, LAD (T), CFX (T), OM1 99%, RCA (T), LIMA-LAD OK, SVG-OM(?3) OK w/ little florw to OM2, SVG-RCA OK. EF NL. Echo in 2011: EF 60-65%.  Pt. Thinks she has had a stress test since her CABG/AVR, but is unsure of the date. Initial troponin 0.05, repeat troponin 2.52 at 5:35am. No ST/T changes on ECG.  Creatinine 1.38.   Hannah [name] is a very pleasant 67 y/o obese wf who presented to the ED early on 9/15 /c c/o SSCP.  She states that at 11pm on 9/14 she was getting up from a chair after watching tv to get ready for bed when she suddenly felt SSCP that felt like "something was sitting on my chest."  The pain did not radiate anywhere, it was constant, and it did not improve with rest.  She also reports dyspnea and troubled breathing, tachycardia, palpitations, and nausea /s vomiting. She denies any lightheadedness, wheezing, cough, vomiting, diaphoresis, increased swelling, fever or chills. She thought she could wait for the pain to resolve on its own by resting, but after 10-15' she and her husband called EMS.  She did not take any medications prior to EMS arrival.  While waiting for EMS, she reports that she checked her BP and HR which were 210/100 and 148 respectively.   Upon EMS arrival, her SSCP had worsened and was more intense.  She was given an ASA and NTG en route to the ED which helped to decrease her pain.  She was given O2 via nasal canula and zofran in the ED, which helped to resolve her dyspnea and nausea. She reports that  her pain and associated sx did not resolve completely until around 2am in the ED; she only took one NTG.   This morning she is pleasant and reports feeling much better than earlier this morning. She denies any current CP, dyspnea, tachycardia, palpitations, cough, N/V, diaphoresis, increased swelling, fever or chills. She states that she feels "back to normal now."  She explains that she has not had an episode of CP in "a very long time," and is unable to define the length but thinks it may have been in 2009 prior to when she had CABG/ AVR.  She reports moderate activity at home normally, but she does have some difficulty with climbing stairs due to dyspnea. She also had chronic leg swelling that comes and goes each day.  She denies orthopnea and PND.  She denies tobacco, alcohol, or illicit drug use. She also reports compliance with her medications, and denies any recent changes to her medications.  She saw Truitt Merle, NP in the office one week ago.    Past Medical History  Diagnosis Date  . Hypertension   . Hyperlipidemia   . Atrial fibrillation     in sinus on Multaq  . CHF (congestive heart failure)     EF is low normal at 50 to 55% per echo in Jan. 2011  . Renal insufficiency, mild   .  Dyspnea     chronic; followed by Dr. Lamonte Sakai  . Nonischemic cardiomyopathy   . Anemia   . Diabetes mellitus   . Iron deficiency anemia   . High risk medication use   . CAD (coronary artery disease)     Remote CABG in 1998 with redo in 2009  . S/P AVR 2009  . Amiodarone toxicity   . Hearing loss of right ear   . Stroke      Past Surgical History  Procedure Laterality Date  . Coronary artery bypass graft  1998 & 2009    Had LIMA to DX/LAD, SVG to 2 marginal branches and SVG to [facility] originally; SVG to 3rd OM and PD at time of redo  . Aortic valve replacement  2009    #3mm [facility] Ease pericardial valve  . Cardiac catheterization  03/23/2008    Pre-redo CABG: L main OK, LAD (T), CFX (T), OM1  99%, RCA (T), LIMA-LAD OK, SVG-OM(?3) OK w/ little florw to OM2, SVG-RCA OK. EF NL    Allergies  Allergen Reactions  . Amoxicillin Rash  . Tetracycline Rash   I have reviewed the patient's current medications . amLODipine  10 mg Oral Daily  . aspirin EC  325 mg Oral Daily  . atorvastatin  40 mg Oral q1800  . budesonide-formoterol  2 puff Inhalation BID  . citalopram  20 mg Oral Daily  . clopidogrel  75 mg Oral Daily  . dronedarone  400 mg Oral BID WC  . insulin aspart  0-15 Units Subcutaneous TID WC  . insulin aspart  0-5 Units Subcutaneous QHS  . insulin aspart  10 Units Subcutaneous TID WC  . insulin glargine  50 Units Subcutaneous QHS  . lisinopril  2.5 mg Oral Daily  . pantoprazole  40 mg Oral Daily     acetaminophen, albuterol, ALPRAZolam, gi cocktail, ondansetron (ZOFRAN) IV  Prior to Admission medications   Medication Sig Start Date End Date Taking? Authorizing Provider  albuterol (PROVENTIL,VENTOLIN) 90 MCG/ACT inhaler Inhale 2 puffs into the lungs every 6 (six) hours as needed. For shortness of breath   Yes Historical Provider, MD  ALPRAZolam (XANAX) 0.25 MG tablet Take 1 tablet (0.25 mg total) by mouth at bedtime as needed for sleep. 02/28/12  Yes Larey Dresser, MD  amLODipine (NORVASC) 10 MG tablet Take 10 mg by mouth daily.   Yes Historical Provider, MD  budesonide-formoterol (SYMBICORT) 160-4.5 MCG/ACT inhaler Inhale 2 puffs into the lungs 2 (two) times daily. 11/25/12  Yes Collene Gobble, MD  Calcium Carbonate-Vit D-Min (CALTRATE PLUS PO) Take 1 tablet by mouth daily.   Yes Historical Provider, MD  citalopram (CELEXA) 20 MG tablet Take 20 mg by mouth daily.   Yes Historical Provider, MD  clopidogrel (PLAVIX) 75 MG tablet Take 75 mg by mouth daily.   Yes Historical Provider, MD  dronedarone (MULTAQ) 400 MG tablet Take 400 mg by mouth 2 (two) times daily with a meal.   Yes Historical Provider, MD  Garlic [id number] MG CAPS Take 1 capsule by mouth daily.   Yes Historical  Provider, MD  insulin glargine (LANTUS) 100 UNIT/ML injection Inject 50 Units into the skin daily.    Yes Historical Provider, MD  insulin lispro (HUMALOG) 100 UNIT/ML injection Inject 10-20 Units into the skin See admin instructions. Per sliding scale 10-20 unit range 3 times daily   Yes Historical Provider, MD  lisinopril (PRINIVIL,ZESTRIL) 2.5 MG tablet Take 1 tablet by mouth Daily. 09/25/10  Yes  Historical Provider, MD  loratadine (CLARITIN) 10 MG tablet Take 1 tablet (10 mg total) by mouth daily. 10/21/12  Yes Collene Gobble, MD  omeprazole (PRILOSEC) 40 MG capsule Take 80 mg by mouth daily.    Yes Historical Provider, MD  rosuvastatin (CRESTOR) 40 MG tablet Take 1 tablet (40 mg total) by mouth daily. 02/16/13  Yes [name], NP  traMADol (ULTRAM) 50 MG tablet Take 50 mg by mouth every 8 (eight) hours as needed.  02/13/13  Yes Historical Provider, MD     History   Social History  . Marital Status: Married    Spouse Name: N/A    Number of Children: N/A  . Years of Education: N/A   Occupational History  . Not on file.   Social History Main Topics  . Smoking status: Never Smoker   . Smokeless tobacco: Never Used  . Alcohol Use: No  . Drug Use: No  . Sexual Activity: Yes   Other Topics Concern  . Not on file   Social History Narrative  . No narrative on file    Family Status  Relation Status Death Age  . Mother Deceased   . Father Deceased    Family History  Problem Relation Age of Onset  . Heart disease Mother   . Heart disease Father   . Heart failure Father      ROS:  Full 14 point review of systems complete and found to be negative unless listed above.  Physical Exam: Blood pressure 154/64, pulse 79, temperature 98.2 F (36.8 C), temperature source Oral, resp. rate 20, height 5' 4.5" (1.638 m), weight 220 lb 11.2 oz (100.109 kg), SpO2 98.00%.  General: Well developed, well nourished, female in no acute distress Head:  Normocephalic and atraumatic, oropharynx  without edema or exudate Lungs: CTAB Heart: HRRR S1 S2 (mechanical S2), no rub/gallop, no murmur. pulses are 2+ upper extrem. LE pulses difficult to palpate secondary to edema.  Neck: Moderate, bilateral carotid bruits. No lymphadenopathy.  Unable to assess JVD due to body habitus. Abdomen: Bowel sounds present, abdomen soft and non-tender without masses or hernias noted. Extremities: No clubbing or cyanosis.  2+ pitting edema bilaterally to mid calf.  Neuro: Alert and oriented X 3. No focal deficits noted. Psych:  Good affect, responds appropriately Skin: No rashes or lesions noted.  Labs:   Lab Results  Component Value Date   WBC 4.9 02/23/2013   HGB 10.2* 02/23/2013   HCT 31.0* 02/23/2013   MCV 88.3 02/23/2013   PLT 146* 02/23/2013    Recent Labs  02/23/13 0125  INR 1.05     Recent Labs Lab 02/23/13 0535  NA 141  K 4.6  CL 106  CO2 27  BUN 19  CREATININE 1.38*  CALCIUM 8.7  PROT 6.1  BILITOT 0.3  ALKPHOS 48  ALT 13  AST 19  GLUCOSE 186*   Magnesium  Date Value Range Status  02/23/2013 1.8  1.5 - 2.5 mg/dL Final    Recent Labs  02/23/13 0108 02/23/13 0535  CKTOTAL 65  --   CKMB 3.3  --   TROPONINI  --  2.52*    Recent Labs  02/23/13 0134  TROPIPOC 0.05   Pro B Natriuretic peptide (BNP)  Date/Time Value Range Status  02/23/2013  1:08 AM 507.4* 0 - 125 pg/mL Final  02/28/2012 12:21 PM 128.0* 0.0 - 100.0 pg/mL Final   Lab Results  Component Value Date   CHOL 143 02/23/2013  HDL 35* 02/23/2013   LDLCALC 82 02/23/2013   TRIG 131 02/23/2013   Lab Results  Component Value Date   DDIMER 0.35 02/23/2013   TSH  Date/Time Value Range Status  02/16/2013  8:52 AM 1.72  0.35 - 5.50 uIU/mL Final    Echo: 08/03/2011 Study Conclusions - Left ventricle: The cavity size was normal. Wall thickness was increased in a pattern of mild LVH. Systolic function was normal. The estimated ejection fraction was in the range of 60% to 65%. Regional wall motion  abnormalities cannot be excluded. Doppler parameters are consistent with abnormal left ventricular relaxation (grade 1 diastolic dysfunction). - Aortic valve: A bioprosthesis was present. Trivial regurgitation. - Mitral valve: Calcified annulus. Mildly thickened leaflets . Mild regurgitation. - Left atrium: The atrium was mildly dilated. - Atrial septum: There was redundancy of the septum, with borderline criteria for aneurysm.  ECG:  02/23/2013 Tachycardia Vent. rate 123 BPM PR interval * ms QRS duration 84 ms QT/QTc 355/508 ms P-R-T axes * 23 172  Lexiscan Myoview: 03/04/2012 QPS  Raw Data Images: Acquisition technically good; normal left ventricular size.  Stress Images: Normal homogeneous uptake in all areas of the myocardium.  Rest Images: Normal homogeneous uptake in all areas of the myocardium.  Subtraction (SDS): No evidence of ischemia.  Transient Ischemic Dilatation (Normal <1.22): 0.99  Lung/Heart Ratio (Normal <0.45): 0.27 Overall Impression: Normal stress nuclear study.  LV Ejection Fraction: 61%. LV Wall Motion: NL LV Function; NL Wall Motion  Normal study  Radiology:  Dg Chest 2 View 02/23/2013   CLINICAL DATA:  Chest pain  EXAM: CHEST  2 VIEW  COMPARISON:  05/12/2009.  FINDINGS: Stable cardiomegaly. Coronary artery atherosclerosis, status post CABG. Aortic valvular replacement. No edema or effusion.  No pneumothorax. No focal infiltrate.  Chronic deformity of the right humeral neck status post fracture 03/23/2010.  IMPRESSION: 1. No active cardiopulmonary disease. 2. Prior CABG and aortic valve replacement.   Electronically Signed   By: Jorje Guild   On: 02/23/2013 01:58    ASSESSMENT AND PLAN:   The patient was seen today by Dr. Meda Coffee, the patient evaluated and the data reviewed.   Principal Problem:   Chest pain-- Pain and associated symptoms have resolved, but repeat troponin elevated to 2.52 from 0.05.  Will need to cycle enzymes to follow trend.  No  ST/T changes on ECG.  Pt. Will likely need cardiac catheterization given her hx and risk factors: HTN, HLD, obesity, DM type II.  Given her mild renal insufficiency, will hydrate pt to prep for possible cath. HR never low, telemetry did not pick up accurately at times. Will add BB, try 12.5 metoprolol BID. She is on ASA/statin. Check echo.   Active Problems:   HYPERLIPIDEMIA--LDL <100.  Good lipid control on Crestor.    HYPERTENSION-- SBP 120-154 mmHg, DBP 50-64 mmHg. She is taking Norvasc 10mg  QD at home. This could explain some of her LE swelling. BP seems to be moderately well controlled.     Atrial fibrillation-- NSR.  Controlled on Multaq 400mg  BID /c meals.     Renal insufficiency, mild--Creatinine 1.38.  Will need to monitor kidney function closely.     Diabetes mellitus, type 2-- glucose levels elevated on BMET to high 180s. Says DM generally well-controlled.      Palpitations - consider event monitor as she may have had recurrence of afib (may even have precipitated MI).    Signed: Lanice Shirts, PA-S 02/23/2013   Rosaria Ferries, PA-C 02/23/2013  8:26 AM Beeper [id number]  Co-Sign MD

## 2013-02-23 NOTE — Progress Notes (Signed)
BP low at 100/50 (manually).  HR=70.  Pt states she feels good, but weak.  RT groin remains level 0.  Denies CP, dizziness, SOB.  Rosaria Ferries, PA notified.  Okay to give pm dose of dronedarone.  If BP continues to drop, place order for 250 NS bolus.  Will continue to monitor pt closely.

## 2013-02-23 NOTE — Interval H&P Note (Signed)
History and Physical Interval Note:  02/23/2013 12:22 PM  Hannah [name]  has presented today for surgery, with the diagnosis of cp  The various methods of treatment have been discussed with the patient and family. After consideration of risks, benefits and other options for treatment, the patient has consented to  Procedure(s): LEFT HEART CATHETERIZATION WITH CORONARY/GRAFT ANGIOGRAM (N/A) as a surgical intervention .  The patient's history has been reviewed, patient examined, no change in status, stable for surgery.  I have reviewed the patient's chart and labs.  Questions were answered to the patient's satisfaction.     Minus Breeding

## 2013-02-23 NOTE — Interval H&P Note (Signed)
Cath Lab Visit (complete for each Cath Lab visit)  Clinical Evaluation Leading to the Procedure:   ACS: yes  Non-ACS:    Anginal Classification: CCS IV  Anti-ischemic medical therapy: Maximal Therapy (2 or more classes of medications)  Non-Invasive Test Results: No non-invasive testing performed  Prior CABG: Previous CABG      History and Physical Interval Note:  02/23/2013 12:22 PM  Hannah [name]  has presented today for surgery, with the diagnosis of cp  The various methods of treatment have been discussed with the patient and family. After consideration of risks, benefits and other options for treatment, the patient has consented to  Procedure(s): LEFT HEART CATHETERIZATION WITH CORONARY/GRAFT ANGIOGRAM (N/A) as a surgical intervention .  The patient's history has been reviewed, patient examined, no change in status, stable for surgery.  I have reviewed the patient's chart and labs.  Questions were answered to the patient's satisfaction.     Minus Breeding

## 2013-02-23 NOTE — ED Notes (Addendum)
Prior to xray pt HR 124 ST, upon return from xray pt HR 50-60s NSR , EKG re-captured.

## 2013-02-24 DIAGNOSIS — I200 Unstable angina: Secondary | ICD-10-CM

## 2013-02-24 DIAGNOSIS — I369 Nonrheumatic tricuspid valve disorder, unspecified: Secondary | ICD-10-CM

## 2013-02-24 DIAGNOSIS — I4891 Unspecified atrial fibrillation: Secondary | ICD-10-CM

## 2013-02-24 HISTORY — PX: TRANSTHORACIC ECHOCARDIOGRAM: SHX275

## 2013-02-24 LAB — BASIC METABOLIC PANEL
BUN: 21 mg/dL (ref 6–23)
CO2: 27 mEq/L (ref 19–32)
Calcium: 8.9 mg/dL (ref 8.4–10.5)
Chloride: 103 mEq/L (ref 96–112)
Creatinine, Ser: 1.54 mg/dL — ABNORMAL HIGH (ref 0.50–1.10)
GFR calc Af Amer: 39 mL/min — ABNORMAL LOW (ref >90)
GFR calc non Af Amer: 34 mL/min — ABNORMAL LOW (ref >90)
Glucose, Bld: 241 mg/dL — ABNORMAL HIGH (ref 70–99)
Potassium: 4.7 mEq/L (ref 3.5–5.1)
Sodium: 138 mEq/L (ref 135–145)

## 2013-02-24 LAB — URINE CULTURE: Colony Count: 8000

## 2013-02-24 LAB — TROPONIN I: Troponin I: 1.2 ng/mL (ref <0.30)

## 2013-02-24 LAB — GLUCOSE, CAPILLARY
Glucose-Capillary: 127 mg/dL — ABNORMAL HIGH (ref 70–99)
Glucose-Capillary: 175 mg/dL — ABNORMAL HIGH (ref 70–99)
Glucose-Capillary: 120 mg/dL — ABNORMAL HIGH (ref 70–99)

## 2013-02-24 MED ORDER — STUDY - INVESTIGATIONAL DRUG SIMPLE RECORD: 7.5000 mg | Freq: Two times a day (BID) | Status: DC

## 2013-02-24 NOTE — Progress Notes (Signed)
Pt stable, d/c'd via wheelchair to private vehicle

## 2013-02-24 NOTE — Progress Notes (Signed)
SUBJECTIVE:  No pain.  No SOB   PHYSICAL EXAM Filed Vitals:   02/23/13 2122 02/23/13 2200 02/24/13 0557 02/24/13 0754  BP: 106/42 100/62 114/41   Pulse: 71  74   Temp: 98 F (36.7 C)  97.7 F (36.5 C)   TempSrc:      Resp: 18  18   Height:      Weight:      SpO2: 96%   95%   General:  No distress Lungs:  Clear Heart:  RRR Abdomen:  Positive bowel sounds, no rebound no guarding Extremities:  Right groin without hematoma or echymosis.  LABS: Lab Results  Component Value Date   CKTOTAL 65 02/23/2013   CKMB 3.3 02/23/2013   TROPONINI 4.43* 02/23/2013   Results for orders placed during the hospital encounter of 02/23/13 (from the past 24 hour(s))  GLUCOSE, CAPILLARY     Status: Abnormal   Collection Time    02/23/13 11:36 AM      Result Value Range   Glucose-Capillary 132 (*) 70 - 99 mg/dL  POCT ACTIVATED CLOTTING TIME     Status: None   Collection Time    02/23/13 12:58 PM      Result Value Range   Activated Clotting Time 150    GLUCOSE, CAPILLARY     Status: Abnormal   Collection Time    02/23/13  1:22 PM      Result Value Range   Glucose-Capillary 110 (*) 70 - 99 mg/dL  GLUCOSE, CAPILLARY     Status: None   Collection Time    02/23/13  3:42 PM      Result Value Range   Glucose-Capillary 80  70 - 99 mg/dL  TROPONIN I     Status: Abnormal   Collection Time    02/23/13  4:48 PM      Result Value Range   Troponin I 4.43 (*) <0.30 ng/mL  CBC     Status: Abnormal   Collection Time    02/23/13  4:51 PM      Result Value Range   WBC 4.6  4.0 - 10.5 K/uL   RBC 3.42 (*) 3.87 - 5.11 MIL/uL   Hemoglobin 10.0 (*) 12.0 - 15.0 g/dL   HCT 30.4 (*) 36.0 - 46.0 %   MCV 88.9  78.0 - 100.0 fL   MCH 29.2  26.0 - 34.0 pg   MCHC 32.9  30.0 - 36.0 g/dL   RDW 12.8  11.5 - 15.5 %   Platelets 144 (*) 150 - 400 K/uL  PLATELET INHIBITION P2Y12     Status: None   Collection Time    02/23/13  5:45 PM      Result Value Range   Platelet Function  P2Y12 316  194 - 418 PRU    GLUCOSE, CAPILLARY     Status: Abnormal   Collection Time    02/23/13  6:17 PM      Result Value Range   Glucose-Capillary 164 (*) 70 - 99 mg/dL  GLUCOSE, CAPILLARY     Status: Abnormal   Collection Time    02/23/13  9:01 PM      Result Value Range   Glucose-Capillary 173 (*) 70 - 99 mg/dL  GLUCOSE, CAPILLARY     Status: Abnormal   Collection Time    02/24/13  7:47 AM      Result Value Range   Glucose-Capillary 127 (*) 70 - 99 mg/dL   Comment 1 Notify RN  Intake/Output Summary (Last 24 hours) at 02/24/13 0803 Last data filed at 02/23/13 1828  Gross per 24 hour  Intake    311 ml  Output      0 ml  Net    311 ml   ASSESSMENT AND PLAN:  NSTEMI:  Probably secondary to rate and small vessel disease.  No further symptoms.  Continue medical management.   Home today if troponin trending down, creat OK and the patient walks. I do not see the echo results in the chart.  This will need to be completed before discharge. She needs SL NTG at discharge.   HYPERTENSION:  See below.  Atrial fibrillation:  Continues Multaq.  No atrial fib was documented with this event.  I do see an EKG with sinus tachycardia.  It is unclear why the initial tachycardia and pain.  Was it ischemia first or an arrhythmia first.  Her coronary anatomy is as listed and the suggestion should be for medical management.  Dr. Aundra Dubin has not yet been inclined to start warfarin but rather to use Multaq and to use ASA and Plavix for her extensive CAD.  I think we should repeat another 21 day event monitor (She will need a 21 day event monitor.  The patients symptoms necessitate an event monitor.  The symptoms are too infrequent to be identified on a holter monitor. )  It does not look like BP will allow med titration at this point.    CHF (congestive heart failure):  She seems to be euvolemic.    Renal insufficiency, mild:  Creat pending.   AVR  Minus Breeding 02/24/2013 8:03 AM

## 2013-02-24 NOTE — Progress Notes (Signed)
  Echocardiogram 2D Echocardiogram has been performed.  Hannah [name] 02/24/2013, 11:44 AM

## 2013-02-24 NOTE — Discharge Summary (Signed)
CARDIOLOGY DISCHARGE SUMMARY   Patient ID: Hannah [name] MRN: [id number] DOB/AGE: 1946/03/08 67 y.o.  Admit date: 02/23/2013 Discharge date: 02/24/2013  Primary Discharge Diagnosis:    Non-ST elevation myocardial infarction (NSTEMI), initial episode of care - medical therapy recommended  Secondary Discharge Diagnosis:    HYPERLIPIDEMIA   HYPERTENSION   Atrial fibrillation   CHF (congestive heart failure)   Renal insufficiency, mild   Diabetes mellitus, type 2   Iron deficiency anemia   S/P aortic valve replacement   CAD (coronary artery disease)  Consults: IM  Procedures: Left Heart Cath, Selective Coronary Angiography, LV angiography; 2D echocardiogram   Hospital Course: Hannah [name] is a 67 y.o. female with a history of CAD. She had palpitations that started the day of admission. She also felt lethargic. She checked her heart rate and blood pressure. They were both elevated, maximum heart rate 148, BP 210/100. She came to the emergency room. She was admitted for further evaluation and treatment.  Upon arrival to the emergency room, her heart rate was in the 120s and was sinus tach. This was slower than the heart rate of 148 she noted on her home blood pressure machine, but there are no strips of this available for review. Her cardiac enzymes were checked and were initially elevated. She was on aspirin, Plavix, statin and beta blocker. Her blood sugars were managed with a combination of her home medications and sliding scale insulin. Her cardiac enzymes showed crescendoed/decrescendo pattern. She has known coronary artery disease. A 2-D echocardiogram was ordered and cardiac catheterization was scheduled.  Cardiac catheterization was performed on 02/23/2013,results are below. She has significant CAD, but medical therapy is the best option, reserved PCI of the CFX for symptoms not controlled by medications. Internal medicine had admitted the patient, but care was transferred to  cardiology service for further management.   On 02/24/2013, Hannah [name] was seen by Dr. Percival Spanish. She had no further episodes of tachycardia, maintaining SR on Multaq. She will get an event monitor as an outpatient. She will need a 21 day event monitor. The patients symptoms necessitate an event monitor. The symptoms are too infrequent to be identified on a holter monitor. She is anticoagulated only with ASA 325 mg/Plavix because of her significant CAD and maintenance of SR. She had an echocardiogram, results below.  Her EF is preserved, with mild AS and MR. PAS 37. Dr. Percival Spanish evaluated Hannah [name] and felt she was doing well. He considered her medically stable for discharge, to follow up as an outpatient.   Labs:   Lab Results  Component Value Date   WBC 4.6 02/23/2013   HGB 10.0* 02/23/2013   HCT 30.4* 02/23/2013   MCV 88.9 02/23/2013   PLT 144* 02/23/2013     Recent Labs Lab 02/23/13 0535 02/24/13 0930  NA 141 138  K 4.6 4.7  CL 106 103  CO2 27 27  BUN 19 21  CREATININE 1.38* 1.54*  CALCIUM 8.7 8.9  PROT 6.1  --   BILITOT 0.3  --   ALKPHOS 48  --   ALT 13  --   AST 19  --   GLUCOSE 186* 241*    Recent Labs  02/23/13 0108 02/23/13 0535 02/23/13 1648 02/24/13 0930  CKTOTAL 65  --   --   --   CKMB 3.3  --   --   --   TROPONINI  --  2.52* 4.43* 1.20*  P2Y12   316    Lipid  Panel     Component Value Date/Time   CHOL 143 02/23/2013 0535   TRIG 131 02/23/2013 0535   HDL 35* 02/23/2013 0535   CHOLHDL 4.1 02/23/2013 0535   VLDL 26 02/23/2013 0535   LDLCALC 82 02/23/2013 0535    Pro B Natriuretic peptide (BNP)  Date/Time Value Range Status  02/23/2013  1:08 AM 507.4* 0 - 125 pg/mL Final  02/28/2012 12:21 PM 128.0* 0.0 - 100.0 pg/mL Final    Recent Labs  02/23/13 0125  INR 1.05      Radiology: Dg Chest 2 View 02/23/2013   CLINICAL DATA:  Chest pain  EXAM: CHEST  2 VIEW  COMPARISON:  05/12/2009.  FINDINGS: Stable cardiomegaly. Coronary artery atherosclerosis, status post  CABG. Aortic valvular replacement. No edema or effusion.  No pneumothorax. No focal infiltrate.  Chronic deformity of the right humeral neck status post fracture 03/23/2010.  IMPRESSION: 1. No active cardiopulmonary disease. 2. Prior CABG and aortic valve replacement.   Electronically Signed   By: Jorje Guild   On: 02/23/2013 01:58   Cardiac Cath: 02/23/2013 Left mainstem: Normal  Left anterior descending (LAD): Severe proximal and mid calcification with long proximal 95% stenosis. The mid and distal vessel is small but free of high grade disease.  Left circumflex (LCx): AV groove has a mid 90% stenosis before a moderate sized MOM. OM1 and OM2 are occluded at the ostium and fill via the SVG. OM3 occluded at the ostium and fills via SVG. The grafted OMs are small and diffusely disease.  Right coronary artery (RCA): Occluded in the mid vessel. The PDA is moderate sized and occluded at the ostium. There is a 70% stenosis in the PDA after the insertion of the vein graft.  Grafts:  LIMA to LAD: Patent  SVG to RCA: Occluded (from original CABG)  SVG to RCA: Patent (from redo CABG). This is mild diffuse plaque within the vein graft.  SVG to OM3: Patent. This is mild diffuse plaque within the vein graft.  SVG sequential to OM1/OM2: Patent. There is ostial 50% stenosis. There is a patent proximal SVG stent. The native marginals are very small.  Left ventriculography: LV not injected. AVR not crossed.  Final Conclusions: Severe native vessel CAD. Patent grafts as described with nonobstructive disease in the grafts. A stent placed into the sequential SVG is patent with some disease at the ostium that on several views in not occlusive. I did compare the 2009 cath with results today. There is high grade disease in the circumflex AV groove. However, this is not changed from 2009. However, this lesion does appear to lead into a non grafted OM. If she has further symptoms I would consider PCI of the native  circumflex.    EKG: 02/23/2013 Vent. rate 123 BPM PR interval * ms QRS duration 84 ms QT/QTc 355/508 ms P-R-T axes * 23 172  Echo: 02/24/2013 Study Conclusions - Left ventricle: The cavity size was normal. Wall thickness was increased in a pattern of mild LVH. Systolic function was normal. The estimated ejection fraction was in the range of 50% to 55%. Wall motion was normal; there were no regional wall motion abnormalities. Left ventricular diastolic function parameters were normal. - Aortic valve: A bioprosthesis was present. There was very mild stenosis. - Mitral valve: Mild regurgitation. - Left atrium: The atrium was mildly dilated. - Right atrium: The atrium was mildly dilated. - Tricuspid valve: Moderate regurgitation. - Pulmonary arteries: PA peak pressure: 40mm Hg (S).  FOLLOW  UP PLANS AND APPOINTMENTS Allergies  Allergen Reactions  . Amoxicillin Rash  . Tetracycline Rash     Medication List    STOP taking these medications       aspirin 325 MG tablet      TAKE these medications       albuterol 90 MCG/ACT inhaler  Commonly known as:  PROVENTIL,VENTOLIN  Inhale 2 puffs into the lungs every 6 (six) hours as needed. For shortness of breath     ALPRAZolam 0.25 MG tablet  Commonly known as:  XANAX  Take 1 tablet (0.25 mg total) by mouth at bedtime as needed for sleep.     amLODipine 10 MG tablet  Commonly known as:  NORVASC  Take 10 mg by mouth daily.     budesonide-formoterol 160-4.5 MCG/ACT inhaler  Commonly known as:  SYMBICORT  Inhale 2 puffs into the lungs 2 (two) times daily.     CALTRATE PLUS PO  Take 1 tablet by mouth daily.     citalopram 20 MG tablet  Commonly known as:  CELEXA  Take 20 mg by mouth daily.     clopidogrel 75 MG tablet  Commonly known as:  PLAVIX  Take 75 mg by mouth daily.     dronedarone 400 MG tablet  Commonly known as:  MULTAQ  Take 400 mg by mouth 2 (two) times daily with a meal.     Garlic [id number] MG Caps  Take  1 capsule by mouth daily.     insulin glargine 100 UNIT/ML injection  Commonly known as:  LANTUS  Inject 50 Units into the skin daily.     insulin lispro 100 UNIT/ML injection  Commonly known as:  HUMALOG  Inject 10-20 Units into the skin See admin instructions. Per sliding scale 10-20 unit range 3 times daily     INVESTIGATIONAL DRUG SIMPLE RECORD  Take 7.5 mg by mouth 2 (two) times daily.     lisinopril 2.5 MG tablet  Commonly known as:  PRINIVIL,ZESTRIL  Take 1 tablet by mouth Daily.     loratadine 10 MG tablet  Commonly known as:  CLARITIN  Take 1 tablet (10 mg total) by mouth daily.     omeprazole 40 MG capsule  Commonly known as:  PRILOSEC  Take 80 mg by mouth daily.     rosuvastatin 40 MG tablet  Commonly known as:  CRESTOR  Take 1 tablet (40 mg total) by mouth daily.     traMADol 50 MG tablet  Commonly known as:  ULTRAM  Take 50 mg by mouth every 8 (eight) hours as needed.        Discharge Orders   Future Appointments Provider Department Dept Phone   03/17/2013 1:30 PM Collene Gobble, MD [facility] [phone]   08/31/2013 8:00 AM [name], NP [facility]) [phone]   Future Orders Complete By Expires   Diet - low sodium heart healthy  As directed    Diet Carb Modified  As directed    Increase activity slowly  As directed      Follow-up Information   Follow up with [facility]. (Event monitor and follow-up appointment. The office will call.)    Contact information:   [address] [place] [address]       BRING ALL MEDICATIONS WITH YOU TO FOLLOW UP APPOINTMENTS  Time spent with patient to include physician time: 38 min Signed: Rosaria Ferries, PA-C 02/24/2013, 5:52 PM Co-Sign MD  Patient seen and examined.  Plan as discussed in my rounding note for today and outlined above. Minus Breeding  02/24/2013  6:49 PM

## 2013-02-25 ENCOUNTER — Telehealth: Payer: Self-pay | Admitting: Cardiology

## 2013-02-25 NOTE — Telephone Encounter (Signed)
Pt advised in the process of getting order for monitor, will call patient when order received.

## 2013-02-25 NOTE — Telephone Encounter (Signed)
New problem  Hannah,Mrs [name] call in to have a monitor place,we need an order and she would like a call back.

## 2013-02-25 NOTE — Telephone Encounter (Signed)
Pt states she was d/c from the hospital yesterday. She is calling with questions about if she should be put on a heart monitor. Pt states the doctor that performed the cath suggested she call us and be placed on a heart monitor. She wants to know what she should do.

## 2013-02-25 NOTE — Telephone Encounter (Signed)
Per Barnett Applebaum, she will contact Rhonda B to place order for monitor in Epic.

## 2013-02-26 ENCOUNTER — Other Ambulatory Visit: Payer: Self-pay | Admitting: Physician Assistant

## 2013-02-26 ENCOUNTER — Other Ambulatory Visit: Payer: Self-pay | Admitting: *Deleted

## 2013-02-26 DIAGNOSIS — R000 Tachycardia, unspecified: Secondary | ICD-10-CM

## 2013-02-26 NOTE — Telephone Encounter (Signed)
Follow Up   Pt is following up on the order for her monitor. Please call.

## 2013-03-02 ENCOUNTER — Encounter: Payer: Self-pay | Admitting: *Deleted

## 2013-03-02 ENCOUNTER — Encounter (INDEPENDENT_AMBULATORY_CARE_PROVIDER_SITE_OTHER): Payer: Medicare Other

## 2013-03-02 DIAGNOSIS — R000 Tachycardia, unspecified: Secondary | ICD-10-CM

## 2013-03-02 NOTE — Telephone Encounter (Signed)
I spoke with Arlyn Leak, clinic services manager about obtaining this order, please follow up with her or Darrick Penna about this.

## 2013-03-02 NOTE — Progress Notes (Signed)
Patient ID: Hannah [name], female   DOB: May 09, 1946, 67 y.o.   MRN: [id number] [employer] 30 day cardiac event monitor applied to patient.

## 2013-03-17 ENCOUNTER — Encounter: Payer: Self-pay | Admitting: Emergency Medicine

## 2013-03-17 ENCOUNTER — Ambulatory Visit (INDEPENDENT_AMBULATORY_CARE_PROVIDER_SITE_OTHER): Payer: Medicare Other | Admitting: Emergency Medicine

## 2013-03-17 ENCOUNTER — Other Ambulatory Visit: Payer: Self-pay | Admitting: Emergency Medicine

## 2013-03-17 VITALS — BP 110/64 | HR 91 | Temp 98.6°F | Ht 65.0 in | Wt 214.0 lb

## 2013-03-17 DIAGNOSIS — J45909 Unspecified asthma, uncomplicated: Secondary | ICD-10-CM

## 2013-03-17 MED ORDER — AEROCHAMBER PLUS MISC: Status: DC

## 2013-03-17 NOTE — Patient Instructions (Addendum)
Continue Symbicort twice a day  Use your spacer Albuterol as needed Take loratadine daily Try using decongestants that contain either chlorpheniramine or brompheniramine as directed Flu Shot is up to date Follow with Dr Lamonte Sakai in 6 months or sooner if you have any problems

## 2013-03-17 NOTE — Assessment & Plan Note (Signed)
Continue Symbicort twice a day  Use your spacer Albuterol as needed Take loratadine daily Try using decongestants that contain either chlorpheniramine or brompheniramine as directed Flu Shot is up to date Follow with Dr Lamonte Sakai in 6 months or sooner if you have any problems

## 2013-03-17 NOTE — Progress Notes (Signed)
Subjective:    Patient ID: Hannah [name], female    DOB: 1945/12/13, 67 y.o.   MRN: [id number]   HPI 67 yo never smoker, hx of CAD/CABG, aortic valve replacement, A Fib. Also with hx of childhood asthma. She was diagnosed with adult asthma in her 57's in setting wheeze, SOB, cough that wouldn't go away. Was on prn SABA for a while, started on Symbicort early 2009. Had PFT's done in August 2009 and 2010 that showed mixed disease. In 10/10 FEV1 1.01L. Averages Prednisone about 3 - 4x a year. Never hospitalized or on MV due to breathing. Amiodarone stopped late 2010.   ROV 10/14/09 -- returns for f/u of her asthma, restrictive disease. Tells me that she has had PND, allergy symptoms this spring, making her mor SOB and giving her more cough.   ROV 03/07/10 -- Hx CAD/CABG, asthma and allergies. Still with some nasal congestion, doing loratadine and fluticasone spray, rarely [place]. Does some throat clearing. Taking her symbicort reliably. Uses ventolin a couple times a month.   ROV 10/04/10 -- Hx CAD/CABG, asthma and allergies. Has been having more trouble with nasal gtt and allergies and therefore more cough chest tightness. Using Symbicort. Has needed ventolin about 4x a day for last week. More SOB. Can't tolerate the NSWs.   ROV 04/24/11 -- Hx CAD/CABG, asthma and allergies. Here for follow up. She was treated over phone for bronchitis after URI - better after doxy and prednisone. Still on Symbicort bid. She is much improved, back to baseline. She has had the flu shot. She is rarely using SABA - was requiring frequently when she was exacerbated. She is having several AE a year.   ROV 02/22/12 -- Hx CAD/CABG, asthma and allergies. She is undergoing eval for HA, nausea, imbalance. May have had a small CVA - had MRI and neuro eval. Sometimes these episodes are associated with SOB. She remains on Symbicort.  She is on loratadine, uses nasal steroid prn.   ROV 09/09/12 -- Hx CAD/CABG, asthma and allergies. She is  starting to have nasal gtt and cough. She can't do [place]. Interested in changing loratadine to alternative. She doesn't like the nasal steroids because they run down her throat. Still on Symbicort.   ROV 03/17/13 -- Hx CAD/CABG, asthma and allergies.  Returns for f/u. She keeps a baseline cough. No asthma flares since last time. Remains on Symbicort, needs a spacer. Went back to loratadine. Not using nasal washes or steroids. She has had Flu Shot.   Review of Systems as per HPI    Objective:   Physical Exam Filed Vitals:   03/17/13 1332  BP: 110/64  Pulse: 91  Temp: 98.6 F (37 C)   Gen: Pleasant, obese, in no distress,  normal affect  ENT: No lesions,  mouth clear,  oropharynx clear, no postnasal drip  Neck: No JVD, no TMG, no carotid bruits  Lungs: No use of accessory muscles, no dullness to percussion, clear without rales or rhonchi  Cardiovascular: RRR, heart sounds normal, no murmur or gallops, no peripheral edema  Musculoskeletal: No deformities, no cyanosis or clubbing  Neuro: alert, non focal  Skin: Warm, no lesions or rashes   Assessment & Plan:  ASTHMA Continue Symbicort twice a day  Use your spacer Albuterol as needed Take loratadine daily Try using decongestants that contain either chlorpheniramine or brompheniramine as directed Flu Shot is up to date Follow with Dr Lamonte Sakai in 6 months or sooner if you have any problems

## 2013-03-23 ENCOUNTER — Other Ambulatory Visit: Payer: Self-pay | Admitting: Cardiology

## 2013-03-24 ENCOUNTER — Telehealth: Payer: Self-pay | Admitting: *Deleted

## 2013-03-24 MED ORDER — METOPROLOL SUCCINATE ER 25 MG PO TB24: 25.0000 mg | ORAL_TABLET | Freq: Every day | ORAL | Status: DC

## 2013-03-24 NOTE — Telephone Encounter (Signed)
Dr Aundra Dubin recommended pt add  Toprol XL 25mg  daily after reviewing monitor tracing 03/18/13  with SVT run. Pt advised, agreed with plan.

## 2013-04-02 ENCOUNTER — Encounter: Payer: Self-pay | Admitting: Cardiology

## 2013-04-02 ENCOUNTER — Ambulatory Visit (INDEPENDENT_AMBULATORY_CARE_PROVIDER_SITE_OTHER): Payer: Medicare Other | Admitting: Cardiology

## 2013-04-02 VITALS — BP 140/50 | HR 76 | Ht 65.0 in | Wt 220.6 lb

## 2013-04-02 DIAGNOSIS — Z954 Presence of other heart-valve replacement: Secondary | ICD-10-CM

## 2013-04-02 DIAGNOSIS — I498 Other specified cardiac arrhythmias: Secondary | ICD-10-CM

## 2013-04-02 DIAGNOSIS — I4891 Unspecified atrial fibrillation: Secondary | ICD-10-CM

## 2013-04-02 DIAGNOSIS — E785 Hyperlipidemia, unspecified: Secondary | ICD-10-CM

## 2013-04-02 DIAGNOSIS — I471 Supraventricular tachycardia: Secondary | ICD-10-CM

## 2013-04-02 DIAGNOSIS — R0989 Other specified symptoms and signs involving the circulatory and respiratory systems: Secondary | ICD-10-CM

## 2013-04-02 DIAGNOSIS — Z952 Presence of prosthetic heart valve: Secondary | ICD-10-CM

## 2013-04-02 DIAGNOSIS — I2510 Atherosclerotic heart disease of native coronary artery without angina pectoris: Secondary | ICD-10-CM

## 2013-04-02 LAB — BASIC METABOLIC PANEL
BUN: 26 mg/dL — ABNORMAL HIGH (ref 6–23)
CO2: 26 mEq/L (ref 19–32)
Calcium: 8.9 mg/dL (ref 8.4–10.5)
Chloride: 104 mEq/L (ref 96–112)
Creatinine, Ser: 1.5 mg/dL — ABNORMAL HIGH (ref 0.4–1.2)
GFR: 36.19 mL/min — ABNORMAL LOW (ref >60.00)
Glucose, Bld: 232 mg/dL — ABNORMAL HIGH (ref 70–99)
Potassium: 4.8 mEq/L (ref 3.5–5.1)
Sodium: 138 mEq/L (ref 135–145)

## 2013-04-02 NOTE — Patient Instructions (Signed)
Your physician recommends that you schedule a follow-up appointment in: 2 months.   Your physician has requested that you have a carotid duplex. This test is an ultrasound of the carotid arteries in your neck. It looks at blood flow through these arteries that supply the brain with blood. Allow one hour for this exam. There are no restrictions or special instructions.  We will call you with the results of the lab work done today

## 2013-04-02 NOTE — Progress Notes (Addendum)
Patient ID: Hannah [name], female   DOB: [birth year], 67 y.o.   MRN: [id number] PCP: Dr. Rita Ohara  67 yo with history of CAD s/p CABG, bioprosthetic AVR, CKD, paroxysmal atrial fibrillation presents for cardiology followup.  She had her initial CABG in 1998, then had redo CABG in 2009 with bioprosthetic AVR.  She does not remember when she last had documented atrial fibrillation, but she thinks it was a number of years ago, sounds peri-operative around the time of her redo CABG.  She was never put on anticoagulation but has been on dronedarone.    Over the summer, she had frequent episodes of tachypalpitations.  In 9/14, she developed tachypalpitations and later chest pain.  In the ER, SBP was in the 200s with elevated HR. Troponin was mildly elevated.  Therefore, she had a cardiac cath showing patent grafts.  She was thought to have had demand ischemia in the setting of hypertensive emergency + tachycardia.  After discharge, she continued to have runs of tachypalpitations.  A 3 week event monitor was put on her.  This showed runs, sometimes long, of atrial tachycardia.  She was started on Toprol XL when these runs were noted.  She is doing well since starting this medication, no further palpitations.  No chest pain.  BP is a little high today but runs systolic [id number] at home.   ECG: NSR, PAC, nonspecific T wave flattening  Labs (2/13): BNP 80, K 5.3, creatinine 1.1 Labs (6/13): LDL 76, HDL 41 Labs (8/13): K 4.3, creatinine 1.38 Labs (9/13): creatinine 1.6, proBNP 128 Labs (9/14): K 4.7, creatinine 1.5, LDL 82, HDL 35  PMH: 1. Diabetes mellitus 2. HTN 3. Obesity 4. Atrial arrhythmias: Paroxysmal atrial fibrillation seems to have been post-operative after CAD-AVR. She has a history of amiodarone toxicity. She has not been on coumadin.  3-week event monitor (9/13) with no atrial fibrillation.  3-week event monitor (10/14) with runs of probable atrial tachycardia.  5. CAD: CABG 1998 with  LIMA-LAD/diagonal, sequential SVG-OM1/OM2, SVG-RCA.  Redo CABG 2009 with SVG-OM3, SVG-PDA.  Lexiscan myoview (9/13): EF 61%, no ischemia or infarction.  LHC (10/14) with all grafts patent except the old SVG-RCA.  There was 90% stenosis in the AV LCx before ungrafted MOM, but this was unchanged from prior study.   6. CKD 7. Bioprosthetic aortic valve: Echo (2011) with EF 50-55%, bioprosthetic aortic valve well-seated.  Echo (2/13) with EF 60-65%, bioprosthetic aortic valve looked ok.  Echo (9/14): EF 50-55%, mild LVH, bioprosthetic AVR with mean gradient 10, mild MR, moderate TR.  8. Asthma: since her teens. 9. Low back pain.  10. 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  Per neurologist, did not appear cardioembolic.  Carotid dopplers without significant stenosis at [facility].   SH: Never smoked.  Lives in [place].  Married.    FH: No premature CAD.   ROS: All systems reviewed and negative except as per HPI.    Current Outpatient Prescriptions  Medication Sig Dispense Refill  . albuterol (PROVENTIL,VENTOLIN) 90 MCG/ACT inhaler Inhale 2 puffs into the lungs every 6 (six) hours as needed. For shortness of breath      . ALPRAZolam (XANAX) 0.25 MG tablet Take 1 tablet (0.25 mg total) by mouth at bedtime as needed for sleep.  30 tablet  0  . amLODipine (NORVASC) 10 MG tablet Take 10 mg by mouth daily.      . budesonide-formoterol (SYMBICORT) 160-4.5 MCG/ACT inhaler Inhale 2 puffs into the lungs  2 (two) times daily.  1 Inhaler  6  . Calcium Carbonate-Vit D-Min (CALTRATE PLUS PO) Take 1 tablet by mouth daily.      . citalopram (CELEXA) 20 MG tablet Take 20 mg by mouth daily.      . clopidogrel (PLAVIX) 75 MG tablet Take 75 mg by mouth daily.      Marland Kitchen dronedarone (MULTAQ) 400 MG tablet Take 400 mg by mouth 2 (two) times daily with a meal.      . Garlic [id number] MG CAPS Take 1 capsule by mouth daily.      . insulin glargine (LANTUS) 100 UNIT/ML injection Inject 50 Units into the skin  daily.       . insulin lispro (HUMALOG) 100 UNIT/ML injection Inject 10-20 Units into the skin See admin instructions. Per sliding scale 10-20 unit range 3 times daily      . INVESTIGATIONAL DRUG SIMPLE RECORD Take 7.5 mg by mouth 2 (two) times daily.      Marland Kitchen lisinopril (PRINIVIL,ZESTRIL) 2.5 MG tablet Take 1 tablet by mouth Daily.      Marland Kitchen loratadine (CLARITIN) 10 MG tablet Take 1 tablet (10 mg total) by mouth daily.  30 tablet  6  . metoprolol succinate (TOPROL XL) 25 MG 24 hr tablet Take 1 tablet (25 mg total) by mouth daily.  30 tablet  6  . omeprazole (PRILOSEC) 40 MG capsule Take 80 mg by mouth daily.       . rosuvastatin (CRESTOR) 40 MG tablet Take 1 tablet (40 mg total) by mouth daily.  30 tablet  11  . Spacer/Aero-Holding Chambers (AEROCHAMBER PLUS) inhaler Use as instructed  1 each  0  . traMADol (ULTRAM) 50 MG tablet Take 50 mg by mouth every 8 (eight) hours as needed.        No current facility-administered medications for this visit.    BP 140/50  Pulse 76  Ht 5\' 5"  (1.651 m)  Wt 100.064 kg (220 lb 9.6 oz)  BMI 36.71 kg/m2 General: NAD Neck: Thick neck, no JVD, no thyromegaly or thyroid nodule.  Lungs: Clear to auscultation bilaterally with normal respiratory effort. CV: Nondisplaced PMI.  Heart regular S1/S2, no S3/S4, 2/6 SEM RUSB.  1+ edema 1/3 up lower legs bilaterally.  Bilateral carotid bruits.  Normal pedal pulses.  Abdomen: Soft, nontender, no hepatosplenomegaly, no distention.  Neurologic: Alert and oriented x 3.  Psych: Normal affect. Extremities: No clubbing or cyanosis.   Assessment/Plan: 1. CAD  Admission in 9/14 with mild troponin elevation.  LHC showed stable anatomy with patent grafts except for SVG-RCA from older CABG.  Suspect that the troponin elevation with demand ischemia in the setting of hypertensive emergency and tachycardia (possible run of atrial tachycardia).   - Continue Plavix, statin, Toprol XL.   2. Atrial Arrhythmias Patient has not been on  anticoagulation. She apparently had atrial fibrillation at some point in the past. It seems that the atrial fibrillation may have been post-op after redo CABG and has not recurred.  She has been on dronedarone. She is in NSR today. However, she has been having runs of very bothersome tachypalpitations.  Event monitor suggested that these symptoms may be coming from atrial tachycardia.  Symptoms seem to have been surpressed by Toprol XL.  I will have her continue Toprol XL and dronedarone and will follow symptomatically.  3. HYPERLIPIDEMIA  Acceptable lipids 9/14, continue statin. .  4. Bioprosthetic aortic valve Well-seated on last echo.  5. Carotid bruit Will check carotid  dopplers.   Loralie Champagne 04/02/2013

## 2013-04-03 ENCOUNTER — Telehealth: Payer: Self-pay | Admitting: Emergency Medicine

## 2013-04-03 DIAGNOSIS — I471 Supraventricular tachycardia: Secondary | ICD-10-CM | POA: Insufficient documentation

## 2013-04-03 DIAGNOSIS — J45909 Unspecified asthma, uncomplicated: Secondary | ICD-10-CM

## 2013-04-03 MED ORDER — ALBUTEROL SULFATE (2.5 MG/3ML) 0.083% IN NEBU: 2.5000 mg | INHALATION_SOLUTION | Freq: Four times a day (QID) | RESPIRATORY_TRACT | Status: DC | PRN

## 2013-04-03 NOTE — Telephone Encounter (Signed)
Pt calling again in ref to previous msg.Hannah [name] ° ° °

## 2013-04-03 NOTE — Telephone Encounter (Signed)
Yes please do

## 2013-04-03 NOTE — Telephone Encounter (Signed)
Called and spoke with pt and she stated that she wants to see if RB will write for a new nebulizer and albuterol. She stated that her old one is broken and her albuterol 0.083 is out of date. She stated that she does not have to use it very often but lately she has been using the symbicort, rescue inhaler prn and she says that her chest still feels tight.  RB please advise if ok to send in order to [facility] for the nebulizer and albuterol.  Thanks  Allergies  Allergen Reactions  . Amoxicillin Rash  . Tetracycline Rash

## 2013-04-03 NOTE — Telephone Encounter (Signed)
Referral and Rx faxed to [facility]. Spoke with Rodena Piety at [facility] and asked her to make arrangements to deliver or allow pt to p/u nebulizer today. Rx called in to local pharmacy for 10-15 day supply to allow [facility] to process Rx order to be delivered to pt's home. Nothing else needed at this time. Rhonda J Cobb

## 2013-04-03 NOTE — Telephone Encounter (Signed)
Spoke with Suanne Marker-- Pt can pick up Nebulizer from DME today but will have to get neb meds through regular pharmacy because it takes 10 days to ship these meds.  Pt okay with paying through regular pharmacy for a 10-15 day supply until she can receive her supply through [facility].  Rx to be given to TP to sign for [facility]. Will forward to JJ to follow up on and will give Rx to [facility] [place].

## 2013-04-03 NOTE — Telephone Encounter (Signed)
Patient has used [facility] in the past--[facility] no longer does RX nebs for [facility]  Patient has chosen to use [facility]([address]) for her new nebulizer and alb neb meds Aware that products are Shipped by [employer] to patients home.  Patient requesting that this be addressed ASAP d/t her having some current breathing issues.  Please advise if patient can receive this today? Patient states that she can come pick up products today if they will allow her.   Order placed and Rx given to [facility].  [facility] please advise thanks!

## 2013-04-06 ENCOUNTER — Encounter (HOSPITAL_COMMUNITY): Payer: Medicare Other

## 2013-04-06 DIAGNOSIS — R0989 Other specified symptoms and signs involving the circulatory and respiratory systems: Secondary | ICD-10-CM

## 2013-04-10 ENCOUNTER — Ambulatory Visit (HOSPITAL_COMMUNITY): Payer: Medicare Other | Attending: Cardiology

## 2013-04-10 DIAGNOSIS — Z951 Presence of aortocoronary bypass graft: Secondary | ICD-10-CM | POA: Insufficient documentation

## 2013-04-10 DIAGNOSIS — I6529 Occlusion and stenosis of unspecified carotid artery: Secondary | ICD-10-CM | POA: Insufficient documentation

## 2013-04-10 DIAGNOSIS — I2510 Atherosclerotic heart disease of native coronary artery without angina pectoris: Secondary | ICD-10-CM | POA: Insufficient documentation

## 2013-04-10 DIAGNOSIS — E119 Type 2 diabetes mellitus without complications: Secondary | ICD-10-CM | POA: Insufficient documentation

## 2013-04-10 DIAGNOSIS — I658 Occlusion and stenosis of other precerebral arteries: Secondary | ICD-10-CM | POA: Insufficient documentation

## 2013-04-10 DIAGNOSIS — R0989 Other specified symptoms and signs involving the circulatory and respiratory systems: Secondary | ICD-10-CM | POA: Insufficient documentation

## 2013-04-10 DIAGNOSIS — Z8673 Personal history of transient ischemic attack (TIA), and cerebral infarction without residual deficits: Secondary | ICD-10-CM | POA: Insufficient documentation

## 2013-04-27 ENCOUNTER — Encounter (HOSPITAL_BASED_OUTPATIENT_CLINIC_OR_DEPARTMENT_OTHER): Payer: Self-pay | Admitting: Emergency Medicine

## 2013-04-27 ENCOUNTER — Emergency Department (HOSPITAL_BASED_OUTPATIENT_CLINIC_OR_DEPARTMENT_OTHER): Payer: Medicare Other

## 2013-04-27 ENCOUNTER — Emergency Department (HOSPITAL_BASED_OUTPATIENT_CLINIC_OR_DEPARTMENT_OTHER)
Admission: EM | Admit: 2013-04-27 | Discharge: 2013-04-27 | Disposition: A | Discharge status: Home / Self Care | Payer: Medicare Other | Attending: Emergency Medicine | Admitting: Emergency Medicine

## 2013-04-27 DIAGNOSIS — Y939 Activity, unspecified: Secondary | ICD-10-CM | POA: Insufficient documentation

## 2013-04-27 DIAGNOSIS — W010XXA Fall on same level from slipping, tripping and stumbling without subsequent striking against object, initial encounter: Secondary | ICD-10-CM | POA: Insufficient documentation

## 2013-04-27 DIAGNOSIS — E119 Type 2 diabetes mellitus without complications: Secondary | ICD-10-CM | POA: Insufficient documentation

## 2013-04-27 DIAGNOSIS — S92302A Fracture of unspecified metatarsal bone(s), left foot, initial encounter for closed fracture: Secondary | ICD-10-CM

## 2013-04-27 DIAGNOSIS — E785 Hyperlipidemia, unspecified: Secondary | ICD-10-CM | POA: Insufficient documentation

## 2013-04-27 DIAGNOSIS — Z79899 Other long term (current) drug therapy: Secondary | ICD-10-CM | POA: Insufficient documentation

## 2013-04-27 DIAGNOSIS — Z862 Personal history of diseases of the blood and blood-forming organs and certain disorders involving the immune mechanism: Secondary | ICD-10-CM | POA: Insufficient documentation

## 2013-04-27 DIAGNOSIS — Z87448 Personal history of other diseases of urinary system: Secondary | ICD-10-CM | POA: Insufficient documentation

## 2013-04-27 DIAGNOSIS — I509 Heart failure, unspecified: Secondary | ICD-10-CM | POA: Insufficient documentation

## 2013-04-27 DIAGNOSIS — I4891 Unspecified atrial fibrillation: Secondary | ICD-10-CM | POA: Insufficient documentation

## 2013-04-27 DIAGNOSIS — Z7902 Long term (current) use of antithrombotics/antiplatelets: Secondary | ICD-10-CM | POA: Insufficient documentation

## 2013-04-27 DIAGNOSIS — Z9861 Coronary angioplasty status: Secondary | ICD-10-CM | POA: Insufficient documentation

## 2013-04-27 DIAGNOSIS — Z8669 Personal history of other diseases of the nervous system and sense organs: Secondary | ICD-10-CM | POA: Insufficient documentation

## 2013-04-27 DIAGNOSIS — Z794 Long term (current) use of insulin: Secondary | ICD-10-CM | POA: Insufficient documentation

## 2013-04-27 DIAGNOSIS — S92309A Fracture of unspecified metatarsal bone(s), unspecified foot, initial encounter for closed fracture: Secondary | ICD-10-CM | POA: Insufficient documentation

## 2013-04-27 DIAGNOSIS — Y929 Unspecified place or not applicable: Secondary | ICD-10-CM | POA: Insufficient documentation

## 2013-04-27 DIAGNOSIS — Z951 Presence of aortocoronary bypass graft: Secondary | ICD-10-CM | POA: Insufficient documentation

## 2013-04-27 DIAGNOSIS — Z8673 Personal history of transient ischemic attack (TIA), and cerebral infarction without residual deficits: Secondary | ICD-10-CM | POA: Insufficient documentation

## 2013-04-27 DIAGNOSIS — I10 Essential (primary) hypertension: Secondary | ICD-10-CM | POA: Insufficient documentation

## 2013-04-27 DIAGNOSIS — I2510 Atherosclerotic heart disease of native coronary artery without angina pectoris: Secondary | ICD-10-CM | POA: Insufficient documentation

## 2013-04-27 NOTE — ED Provider Notes (Signed)
CSN: [id number]     Arrival date & time 04/27/13  1804 History  This chart was scribed for Malvin Johns, MD by Maree Erie, ED Scribe. The patient was seen in room [place]. Patient's care was started at 6:33 PM.      Chief Complaint  Patient presents with  . Foot Pain    Patient is a 67 y.o. female presenting with lower extremity pain. The history is provided by the patient. No language interpreter was used.  Foot Pain Pertinent negatives include no headaches.    HPI Comments: Hannah [name] is a 67 y.o. female who presents to the Emergency Department due to a left foot injury that occurred yesterday when she slipped off a sidewalk. She is complaining of pain and associated swelling to the affected area onset immediately after the injury. The pain is worsened with movement of the left toes. She describes the pain as "sore." She has been taking Tylenol and using ice with mild relief. She denies any pain to the left ankle.    Past Medical History  Diagnosis Date  . Hypertension   . Hyperlipidemia   . Atrial fibrillation     in sinus on Multaq  . CHF (congestive heart failure)     EF is low normal at 50 to 55% per echo in Jan. 2011  . Renal insufficiency, mild   . Dyspnea     chronic; followed by Dr. Lamonte Sakai  . Nonischemic cardiomyopathy   . Anemia   . Diabetes mellitus   . Iron deficiency anemia   . High risk medication use   . CAD (coronary artery disease)     Remote CABG in 1998 with redo in 2009  . S/P AVR 2009  . Amiodarone toxicity   . Hearing loss of right ear   . Stroke    Past Surgical History  Procedure Laterality Date  . Coronary artery bypass graft  1998 & 2009    Had LIMA to DX/LAD, SVG to 2 marginal branches and SVG to [facility] originally; SVG to 3rd OM and PD at time of redo  . Aortic valve replacement  2009    #35mm [facility] Ease pericardial valve  . Cardiac catheterization  03/23/2008    Pre-redo CABG: L main OK, LAD (T), CFX (T), OM1 99%, RCA (T),  LIMA-LAD OK, SVG-OM(?3) OK w/ little florw to OM2, SVG-RCA OK. EF NL   Family History  Problem Relation Age of Onset  . Heart disease Mother   . Heart disease Father   . Heart failure Father    History  Substance Use Topics  . Smoking status: Never Smoker   . Smokeless tobacco: Never Used  . Alcohol Use: No   OB History   Grav Para Term Preterm Abortions TAB SAB Ect Mult Living                 Review of Systems  Constitutional: Negative for fever.  Gastrointestinal: Negative for nausea and vomiting.  Musculoskeletal: Positive for arthralgias and joint swelling. Negative for back pain and neck pain.  Skin: Negative for wound.  Neurological: Negative for weakness, numbness and headaches.    Allergies  Amoxicillin and Tetracycline  Home Medications   Current Outpatient Rx  Name  Route  Sig  Dispense  Refill  . albuterol (PROVENTIL) (2.5 MG/3ML) 0.083% nebulizer solution   Nebulization   Take 3 mLs (2.5 mg total) by nebulization every 6 (six) hours as needed for wheezing or shortness of breath.  180 mL   0   . albuterol (PROVENTIL,VENTOLIN) 90 MCG/ACT inhaler   Inhalation   Inhale 2 puffs into the lungs every 6 (six) hours as needed. For shortness of breath         . ALPRAZolam (XANAX) 0.25 MG tablet   Oral   Take 1 tablet (0.25 mg total) by mouth at bedtime as needed for sleep.   30 tablet   0     Dr Aundra Dubin will not be able to provide additional r ...   . amLODipine (NORVASC) 10 MG tablet   Oral   Take 10 mg by mouth daily.         . budesonide-formoterol (SYMBICORT) 160-4.5 MCG/ACT inhaler   Inhalation   Inhale 2 puffs into the lungs 2 (two) times daily.   1 Inhaler   6   . Calcium Carbonate-Vit D-Min (CALTRATE PLUS PO)   Oral   Take 1 tablet by mouth daily.         . citalopram (CELEXA) 20 MG tablet   Oral   Take 20 mg by mouth daily.         . clopidogrel (PLAVIX) 75 MG tablet   Oral   Take 75 mg by mouth daily.         Marland Kitchen  dronedarone (MULTAQ) 400 MG tablet   Oral   Take 400 mg by mouth 2 (two) times daily with a meal.         . Garlic 123XX123 MG CAPS   Oral   Take 1 capsule by mouth daily.         . insulin glargine (LANTUS) 100 UNIT/ML injection   Subcutaneous   Inject 50 Units into the skin daily.          . insulin lispro (HUMALOG) 100 UNIT/ML injection   Subcutaneous   Inject 10-20 Units into the skin See admin instructions. Per sliding scale 10-20 unit range 3 times daily         . INVESTIGATIONAL DRUG SIMPLE RECORD   Oral   Take 7.5 mg by mouth 2 (two) times daily.         Marland Kitchen lisinopril (PRINIVIL,ZESTRIL) 2.5 MG tablet   Oral   Take 1 tablet by mouth Daily.         Marland Kitchen loratadine (CLARITIN) 10 MG tablet   Oral   Take 1 tablet (10 mg total) by mouth daily.   30 tablet   6   . metoprolol succinate (TOPROL XL) 25 MG 24 hr tablet   Oral   Take 1 tablet (25 mg total) by mouth daily.   30 tablet   6   . omeprazole (PRILOSEC) 40 MG capsule   Oral   Take 80 mg by mouth daily.          . rosuvastatin (CRESTOR) 40 MG tablet   Oral   Take 1 tablet (40 mg total) by mouth daily.   30 tablet   11   . Spacer/Aero-Holding Chambers (AEROCHAMBER PLUS) inhaler      Use as instructed   1 each   0   . traMADol (ULTRAM) 50 MG tablet   Oral   Take 50 mg by mouth every 8 (eight) hours as needed.           Triage Vitals: BP 139/72  Pulse 79  Temp(Src) 98.6 F (37 C) (Oral)  Resp 18  Ht 5\' 5"  (1.651 m)  Wt 214 lb (97.07 kg)  BMI 35.61  kg/m2  SpO2 97%  Physical Exam  Constitutional: She is oriented to person, place, and time. She appears well-developed and well-nourished.  HENT:  Head: Normocephalic and atraumatic.  Neck: Normal range of motion. Neck supple.  Cardiovascular: Normal rate.   Pulmonary/Chest: Effort normal.  Musculoskeletal: She exhibits edema and tenderness.  Tenderness along lateral aspect of left foot to 4th and 5th metatarsals. Mild swelling. No pain to  ankle or lower leg.  Neurological: She is alert and oriented to person, place, and time.  Neurovascularly intact.   Skin: Skin is warm and dry.  No wounds.  Psychiatric: She has a normal mood and affect.    ED Course  Procedures (including critical care time)  DIAGNOSTIC STUDIES: Oxygen Saturation is 97% on room air, normal by my interpretation.    COORDINATION OF CARE: 9:03 PM - Patient verbalizes understanding and agrees with treatment plan.    Labs Review Labs Reviewed - No data to display Imaging Review Dg Foot Complete Left  04/27/2013   CLINICAL DATA:  Injured left foot.  EXAM: LEFT FOOT - COMPLETE 3+ VIEW  COMPARISON:  None.  FINDINGS: There is a nondisplaced transverse fracture at the base of the 5th metatarsal. No other fractures are identified. The joint spaces are maintained.  IMPRESSION: Nondisplaced 5th metatarsal base fracture.   Electronically Signed   By: Kalman Jewels M.D.   On: 04/27/2013 18:36    EKG Interpretation   None       MDM   1. Fracture of 5th metatarsal, left, closed, initial encounter    Patient was placed in a postop shoe. She was advised in ice and elevation. She has a walker at home to use. She is advised to followup with her orthopedist within the next few days or return here as needed for any worsening symptoms.  I personally performed the services described in this documentation, which was scribed in my presence.  The recorded information has been reviewed and considered.    Malvin Johns, MD 04/27/13 2104

## 2013-04-27 NOTE — ED Notes (Signed)
Hurt left foot side of foot painful and swollen

## 2013-05-20 ENCOUNTER — Other Ambulatory Visit: Payer: Self-pay | Admitting: Cardiology

## 2013-05-29 ENCOUNTER — Encounter (INDEPENDENT_AMBULATORY_CARE_PROVIDER_SITE_OTHER): Payer: Self-pay

## 2013-05-29 DIAGNOSIS — R0989 Other specified symptoms and signs involving the circulatory and respiratory systems: Secondary | ICD-10-CM

## 2013-06-03 ENCOUNTER — Encounter: Payer: Self-pay | Admitting: *Deleted

## 2013-06-09 ENCOUNTER — Ambulatory Visit (INDEPENDENT_AMBULATORY_CARE_PROVIDER_SITE_OTHER): Payer: Medicare Other | Admitting: Cardiology

## 2013-06-09 ENCOUNTER — Encounter: Payer: Self-pay | Admitting: Cardiology

## 2013-06-09 VITALS — BP 124/54 | HR 80 | Ht 65.0 in | Wt 217.0 lb

## 2013-06-09 DIAGNOSIS — Z952 Presence of prosthetic heart valve: Secondary | ICD-10-CM

## 2013-06-09 DIAGNOSIS — I10 Essential (primary) hypertension: Secondary | ICD-10-CM

## 2013-06-09 DIAGNOSIS — E785 Hyperlipidemia, unspecified: Secondary | ICD-10-CM

## 2013-06-09 DIAGNOSIS — Z954 Presence of other heart-valve replacement: Secondary | ICD-10-CM

## 2013-06-09 DIAGNOSIS — I2510 Atherosclerotic heart disease of native coronary artery without angina pectoris: Secondary | ICD-10-CM

## 2013-06-09 DIAGNOSIS — I471 Supraventricular tachycardia: Secondary | ICD-10-CM

## 2013-06-09 DIAGNOSIS — I498 Other specified cardiac arrhythmias: Secondary | ICD-10-CM

## 2013-06-09 DIAGNOSIS — I4891 Unspecified atrial fibrillation: Secondary | ICD-10-CM

## 2013-06-09 NOTE — Progress Notes (Signed)
Patient ID: Hannah [name], female   DOB: Apr 30, 1946, 67 y.o.   MRN: [id number] PCP: Dr. Suzy Bouchard  67 yo with history of CAD s/p CABG, bioprosthetic AVR, CKD, paroxysmal atrial fibrillation presents for cardiology followup.  She had her initial CABG in 1998, then had redo CABG in 2009 with bioprosthetic AVR.  She does not remember when she last had documented atrial fibrillation, but she thinks it was a number of years ago, sounds peri-operative around the time of her redo CABG.  She was never put on anticoagulation but has been on dronedarone.    Over the summer, she had frequent episodes of tachypalpitations.  In 9/14, she developed tachypalpitations and later chest pain.  In the ER, SBP was in the 200s with elevated HR. Troponin was mildly elevated.  Therefore, she had a cardiac cath showing patent grafts.  She was thought to have had demand ischemia in the setting of hypertensive emergency + tachycardia.  After discharge, she continued to have runs of tachypalpitations.  A 3 week event monitor was put on her.  This showed runs, sometimes long, of atrial tachycardia.  She was started on Toprol XL when these runs were noted.  She is doing well since starting this medication, no further palpitations (complete resolution of symptoms).  No chest pain.  BP controlled.  She has stable mild exertional dyspnea with stairs and hills. Occasional wheezing due to asthma. Sleep study in 12/14 showed no significant OSA.  Weight is down 3 lbs. She has ankle edema, but this will have resolved when she gets up in the morning.   ECG: NSR, nonspecific T wave flattening  Labs (2/13): BNP 80, K 5.3, creatinine 1.1 Labs (6/13): LDL 76, HDL 41 Labs (8/13): K 4.3, creatinine 1.38 Labs (9/13): creatinine 1.6, proBNP 128 Labs (9/14): K 4.7, creatinine 1.5, LDL 82, HDL 35 Labs (10/14): K 4.8, creatinine 1.5  PMH: 1. Diabetes mellitus 2. HTN 3. Obesity 4. Atrial arrhythmias: Paroxysmal atrial fibrillation seems to have been  post-operative after CAD-AVR. She has a history of amiodarone toxicity. She has not been on coumadin.  3-week event monitor (9/13) with no atrial fibrillation.  3-week event monitor (10/14) with runs of probable atrial tachycardia.  5. CAD: CABG 1998 with LIMA-LAD/diagonal, sequential SVG-OM1/OM2, SVG-RCA.  Redo CABG 2009 with SVG-OM3, SVG-PDA.  Lexiscan myoview (9/13): EF 61%, no ischemia or infarction.  LHC (10/14) with all grafts patent except the old SVG-RCA.  There was 90% stenosis in the AV LCx before ungrafted MOM, but this was unchanged from prior study.   6. CKD 7. Bioprosthetic aortic valve: Echo (2011) with EF 50-55%, bioprosthetic aortic valve well-seated.  Echo (2/13) with EF 60-65%, bioprosthetic aortic valve looked ok.  Echo (9/14): EF 50-55%, mild LVH, bioprosthetic AVR with mean gradient 10, mild MR, moderate TR.  8. Asthma: since her teens. 9. Low back pain.  10. 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  Per neurologist, did not appear cardioembolic.  11. Carotid stenosis: 40-59% bilateral ICA stenosis on 10/14 carotid dopplers.  12. Sleep study 12/14 without significant OSA.   SH: Never smoked.  Lives in [place].  Married.    FH: No premature CAD.    Current Outpatient Prescriptions  Medication Sig Dispense Refill  . albuterol (PROVENTIL) (2.5 MG/3ML) 0.083% nebulizer solution Take 3 mLs (2.5 mg total) by nebulization every 6 (six) hours as needed for wheezing or shortness of breath.  180 mL  0  . albuterol (PROVENTIL,VENTOLIN) 90 MCG/ACT  inhaler Inhale 2 puffs into the lungs every 6 (six) hours as needed. For shortness of breath      . ALPRAZolam (XANAX) 0.25 MG tablet Take 1 tablet (0.25 mg total) by mouth at bedtime as needed for sleep.  30 tablet  0  . amLODipine (NORVASC) 10 MG tablet TAKE ONE TABLET BY MOUTH ONCE DAILY  30 tablet  0  . budesonide-formoterol (SYMBICORT) 160-4.5 MCG/ACT inhaler Inhale 2 puffs into the lungs 2 (two) times daily.  1 Inhaler  6  .  Calcium Carbonate-Vit D-Min (CALTRATE PLUS PO) Take 1 tablet by mouth daily.      . citalopram (CELEXA) 20 MG tablet Take 20 mg by mouth daily.      . clopidogrel (PLAVIX) 75 MG tablet Take 75 mg by mouth daily.      Marland Kitchen dronedarone (MULTAQ) 400 MG tablet Take 400 mg by mouth 2 (two) times daily with a meal.      . Garlic [id number] MG CAPS Take 1 capsule by mouth daily.      . insulin glargine (LANTUS) 100 UNIT/ML injection Inject 50 Units into the skin daily.       . insulin lispro (HUMALOG) 100 UNIT/ML injection Inject 10-20 Units into the skin See admin instructions. Per sliding scale 10-20 unit range 3 times daily      . INVESTIGATIONAL DRUG SIMPLE RECORD Take 7.5 mg by mouth 2 (two) times daily.      Marland Kitchen lisinopril (PRINIVIL,ZESTRIL) 2.5 MG tablet Take 1 tablet by mouth Daily.      Marland Kitchen loratadine (CLARITIN) 10 MG tablet Take 1 tablet (10 mg total) by mouth daily.  30 tablet  6  . metoprolol succinate (TOPROL XL) 25 MG 24 hr tablet Take 1 tablet (25 mg total) by mouth daily.  30 tablet  6  . omeprazole (PRILOSEC) 40 MG capsule Take 80 mg by mouth daily.       . rosuvastatin (CRESTOR) 40 MG tablet Take 1 tablet (40 mg total) by mouth daily.  30 tablet  11  . Spacer/Aero-Holding Chambers (AEROCHAMBER PLUS) inhaler Use as instructed  1 each  0  . traMADol (ULTRAM) 50 MG tablet Take 50 mg by mouth every 8 (eight) hours as needed.        No current facility-administered medications for this visit.    BP 124/54  Pulse 80  Ht 5\' 5"  (1.651 m)  Wt 98.431 kg (217 lb)  BMI 36.11 kg/m2 General: NAD Neck: Thick neck, no JVD, no thyromegaly or thyroid nodule.  Lungs: Clear to auscultation bilaterally with normal respiratory effort. CV: Nondisplaced PMI.  Heart regular S1/S2, no S3/S4, 2/6 SEM RUSB.  1+ edema 1/3 up lower legs bilaterally.  Bilateral carotid bruits.  Normal pedal pulses.  Abdomen: Soft, nontender, no hepatosplenomegaly, no distention.  Neurologic: Alert and oriented x 3.  Psych: Normal  affect. Extremities: No clubbing or cyanosis.   Assessment/Plan: 1. CAD  Admission in 9/14 with mild troponin elevation.  LHC showed stable anatomy with patent grafts except for SVG-RCA from older CABG.  Suspect that the troponin elevation was due to demand ischemia in the setting of hypertensive emergency and tachycardia (possible run of atrial tachycardia).   - Continue Plavix, statin, Toprol XL.   2. Atrial Arrhythmias Patient has not been on anticoagulation. She apparently had atrial fibrillation at some point in the past. It seems that the atrial fibrillation may have been post-op after redo CABG and has not recurred.  She has been on  dronedarone. She is in NSR today. She had palpitations this fall.  Event monitor suggested that these symptoms may be coming from atrial tachycardia.  Symptoms seem to have been suppressed completely by Toprol XL.  I will have her continue Toprol XL and dronedarone and will follow symptomatically.  3. HYPERLIPIDEMIA  Acceptable lipids 9/14, continue statin. .  4. Bioprosthetic aortic valve Well-seated on last echo.  5. Carotid bruit Repeat carotid dopplers in 10/15.    Loralie Champagne 06/09/2013

## 2013-06-09 NOTE — Patient Instructions (Signed)
Your physician recommends that you continue on your current medications as directed. Please refer to the Current Medication list given to you today.  Your physician wants you to follow-up in: 6 months. You will receive a reminder letter in the mail two months in advance. If you don't receive a letter, please call our office to schedule the follow-up appointment.  

## 2013-06-14 ENCOUNTER — Emergency Department (HOSPITAL_COMMUNITY): Payer: Medicare Other

## 2013-06-14 ENCOUNTER — Encounter (HOSPITAL_COMMUNITY): Payer: Self-pay | Admitting: Emergency Medicine

## 2013-06-14 ENCOUNTER — Observation Stay (HOSPITAL_COMMUNITY)
Admission: EM | Admit: 2013-06-14 | Discharge: 2013-06-15 | Disposition: A | Discharge status: Home / Self Care | Payer: Medicare Other | Attending: Cardiology | Admitting: Cardiology

## 2013-06-14 DIAGNOSIS — R0602 Shortness of breath: Secondary | ICD-10-CM | POA: Diagnosis present

## 2013-06-14 DIAGNOSIS — I4891 Unspecified atrial fibrillation: Secondary | ICD-10-CM | POA: Insufficient documentation

## 2013-06-14 DIAGNOSIS — E119 Type 2 diabetes mellitus without complications: Secondary | ICD-10-CM | POA: Insufficient documentation

## 2013-06-14 DIAGNOSIS — Z8673 Personal history of transient ischemic attack (TIA), and cerebral infarction without residual deficits: Secondary | ICD-10-CM | POA: Insufficient documentation

## 2013-06-14 DIAGNOSIS — I471 Supraventricular tachycardia: Secondary | ICD-10-CM | POA: Diagnosis present

## 2013-06-14 DIAGNOSIS — I2510 Atherosclerotic heart disease of native coronary artery without angina pectoris: Secondary | ICD-10-CM | POA: Insufficient documentation

## 2013-06-14 DIAGNOSIS — E785 Hyperlipidemia, unspecified: Secondary | ICD-10-CM | POA: Insufficient documentation

## 2013-06-14 DIAGNOSIS — Z881 Allergy status to other antibiotic agents status: Secondary | ICD-10-CM | POA: Insufficient documentation

## 2013-06-14 DIAGNOSIS — IMO0002 Reserved for concepts with insufficient information to code with codable children: Secondary | ICD-10-CM | POA: Insufficient documentation

## 2013-06-14 DIAGNOSIS — N289 Disorder of kidney and ureter, unspecified: Secondary | ICD-10-CM | POA: Insufficient documentation

## 2013-06-14 DIAGNOSIS — Z794 Long term (current) use of insulin: Secondary | ICD-10-CM | POA: Insufficient documentation

## 2013-06-14 DIAGNOSIS — Z951 Presence of aortocoronary bypass graft: Secondary | ICD-10-CM | POA: Insufficient documentation

## 2013-06-14 DIAGNOSIS — I509 Heart failure, unspecified: Secondary | ICD-10-CM | POA: Insufficient documentation

## 2013-06-14 DIAGNOSIS — H9190 Unspecified hearing loss, unspecified ear: Secondary | ICD-10-CM | POA: Insufficient documentation

## 2013-06-14 DIAGNOSIS — R079 Chest pain, unspecified: Secondary | ICD-10-CM

## 2013-06-14 DIAGNOSIS — Z880 Allergy status to penicillin: Secondary | ICD-10-CM | POA: Insufficient documentation

## 2013-06-14 DIAGNOSIS — I160 Hypertensive urgency: Secondary | ICD-10-CM

## 2013-06-14 DIAGNOSIS — Z7902 Long term (current) use of antithrombotics/antiplatelets: Secondary | ICD-10-CM | POA: Insufficient documentation

## 2013-06-14 DIAGNOSIS — I252 Old myocardial infarction: Secondary | ICD-10-CM | POA: Insufficient documentation

## 2013-06-14 DIAGNOSIS — I200 Unstable angina: Principal | ICD-10-CM | POA: Insufficient documentation

## 2013-06-14 DIAGNOSIS — D509 Iron deficiency anemia, unspecified: Secondary | ICD-10-CM | POA: Insufficient documentation

## 2013-06-14 DIAGNOSIS — Z79899 Other long term (current) drug therapy: Secondary | ICD-10-CM | POA: Insufficient documentation

## 2013-06-14 DIAGNOSIS — R110 Nausea: Secondary | ICD-10-CM | POA: Insufficient documentation

## 2013-06-14 DIAGNOSIS — I10 Essential (primary) hypertension: Secondary | ICD-10-CM | POA: Insufficient documentation

## 2013-06-14 DIAGNOSIS — Z954 Presence of other heart-valve replacement: Secondary | ICD-10-CM | POA: Insufficient documentation

## 2013-06-14 LAB — POCT I-STAT, CHEM 8
BUN: 28 mg/dL — ABNORMAL HIGH (ref 6–23)
Calcium, Ion: 1.19 mmol/L (ref 1.13–1.30)
Chloride: 104 mEq/L (ref 96–112)
Creatinine, Ser: 1.6 mg/dL — ABNORMAL HIGH (ref 0.50–1.10)
Glucose, Bld: 156 mg/dL — ABNORMAL HIGH (ref 70–99)
HCT: 32 % — ABNORMAL LOW (ref 36.0–46.0)
Hemoglobin: 10.9 g/dL — ABNORMAL LOW (ref 12.0–15.0)
Potassium: 4.7 mEq/L (ref 3.7–5.3)
Sodium: 140 mEq/L (ref 137–147)
TCO2: 25 mmol/L (ref 0–100)

## 2013-06-14 LAB — POCT I-STAT TROPONIN I: Troponin i, poc: 0.01 ng/mL (ref 0.00–0.08)

## 2013-06-14 LAB — CBC WITH DIFFERENTIAL/PLATELET
Basophils Absolute: 0 10*3/uL (ref 0.0–0.1)
Basophils Relative: 1 % (ref 0–1)
Eosinophils Absolute: 0.3 10*3/uL (ref 0.0–0.7)
Eosinophils Relative: 4 % (ref 0–5)
HCT: 33.3 % — ABNORMAL LOW (ref 36.0–46.0)
Hemoglobin: 11 g/dL — ABNORMAL LOW (ref 12.0–15.0)
Lymphocytes Relative: 18 % (ref 12–46)
Lymphs Abs: 1.2 10*3/uL (ref 0.7–4.0)
MCH: 29.1 pg (ref 26.0–34.0)
MCHC: 33 g/dL (ref 30.0–36.0)
MCV: 88.1 fL (ref 78.0–100.0)
Monocytes Absolute: 0.8 10*3/uL (ref 0.1–1.0)
Monocytes Relative: 12 % (ref 3–12)
Neutro Abs: 4.5 10*3/uL (ref 1.7–7.7)
Neutrophils Relative %: 65 % (ref 43–77)
Platelets: 164 10*3/uL (ref 150–400)
RBC: 3.78 MIL/uL — ABNORMAL LOW (ref 3.87–5.11)
RDW: 12.7 % (ref 11.5–15.5)
WBC: 6.8 10*3/uL (ref 4.0–10.5)

## 2013-06-14 MED ORDER — NITROGLYCERIN 2 % TD OINT
1.0000 [in_us] | TOPICAL_OINTMENT | Freq: Once | TRANSDERMAL | Status: AC
  Administered 2013-06-14: 1 [in_us] via TOPICAL
  Filled 2013-06-14: qty 1

## 2013-06-14 NOTE — ED Provider Notes (Signed)
CSN: [id number]     Arrival date & time 06/14/13  2109 History   First MD Initiated Contact with Patient 06/14/13 2121     Chief Complaint  Patient presents with  . Chest Pain   (Consider location/radiation/quality/duration/timing/severity/associated sxs/prior Treatment) Patient is a 68 y.o. female presenting with chest pain.  Chest Pain Associated symptoms: nausea     ComPlains of anterior chest pain nonradiating pressure-like diffuse right-sided rib and left feels like MI that she suffered in September of 2014. She was treated aspirin 325 mg and 2 sublingual nitroglycerin with partial relief discomfort. Presently discomfort is almost gone. EMS treated patient with Zofran intravenously with relief of nausea. Nothing makes symptoms worse symptoms improved after treatment with nitroglycerin. No other associated symptoms Past Medical History  Diagnosis Date  . Hypertension   . Hyperlipidemia   . Atrial fibrillation     in sinus on Multaq  . CHF (congestive heart failure)     EF is low normal at 50 to 55% per echo in Jan. 2011  . Renal insufficiency, mild   . Dyspnea     chronic; followed by Dr. Lamonte Sakai  . Nonischemic cardiomyopathy   . Anemia   . Diabetes mellitus   . Iron deficiency anemia   . High risk medication use   . CAD (coronary artery disease)     Remote CABG in 1998 with redo in 2009  . S/P AVR 2009  . Amiodarone toxicity   . Hearing loss of right ear   . Stroke    NSTEMI 2014 Past Surgical History  Procedure Laterality Date  . Coronary artery bypass graft  1998 & 2009    Had LIMA to DX/LAD, SVG to 2 marginal branches and SVG to [facility] originally; SVG to 3rd OM and PD at time of redo  . Aortic valve replacement  2009    #28mm [facility] Ease pericardial valve  . Cardiac catheterization  03/23/2008    Pre-redo CABG: L main OK, LAD (T), CFX (T), OM1 99%, RCA (T), LIMA-LAD OK, SVG-OM(?3) OK w/ little florw to OM2, SVG-RCA OK. EF NL   Family History  Problem Relation Age  of Onset  . Heart disease Mother   . Heart disease Father   . Heart failure Father    History  Substance Use Topics  . Smoking status: Never Smoker   . Smokeless tobacco: Never Used  . Alcohol Use: No   OB History   Grav Para Term Preterm Abortions TAB SAB Ect Mult Living                 Review of Systems  Constitutional: Negative.   HENT: Negative.   Respiratory: Negative.   Cardiovascular: Positive for chest pain.  Gastrointestinal: Positive for nausea.  Musculoskeletal: Negative.   Skin: Negative.   Neurological: Negative.   Psychiatric/Behavioral: Negative.   All other systems reviewed and are negative.    Allergies  Amoxicillin and Tetracycline  Home Medications   Current Outpatient Rx  Name  Route  Sig  Dispense  Refill  . albuterol (PROVENTIL) (2.5 MG/3ML) 0.083% nebulizer solution   Nebulization   Take 3 mLs (2.5 mg total) by nebulization every 6 (six) hours as needed for wheezing or shortness of breath.   180 mL   0   . albuterol (PROVENTIL,VENTOLIN) 90 MCG/ACT inhaler   Inhalation   Inhale 2 puffs into the lungs every 6 (six) hours as needed. For shortness of breath         .  ALPRAZolam (XANAX) 0.25 MG tablet   Oral   Take 1 tablet (0.25 mg total) by mouth at bedtime as needed for sleep.   30 tablet   0     Dr Aundra Dubin will not be able to provide additional r ...   . amLODipine (NORVASC) 10 MG tablet      TAKE ONE TABLET BY MOUTH ONCE DAILY   30 tablet   0   . budesonide-formoterol (SYMBICORT) 160-4.5 MCG/ACT inhaler   Inhalation   Inhale 2 puffs into the lungs 2 (two) times daily.   1 Inhaler   6   . Calcium Carbonate-Vit D-Min (CALTRATE PLUS PO)   Oral   Take 1 tablet by mouth daily.         . citalopram (CELEXA) 20 MG tablet   Oral   Take 20 mg by mouth daily.         . clopidogrel (PLAVIX) 75 MG tablet   Oral   Take 75 mg by mouth daily.         Marland Kitchen dronedarone (MULTAQ) 400 MG tablet   Oral   Take 400 mg by mouth 2  (two) times daily with a meal.         . Garlic [id number] MG CAPS   Oral   Take 1 capsule by mouth daily.         . insulin glargine (LANTUS) 100 UNIT/ML injection   Subcutaneous   Inject 50 Units into the skin daily.          . insulin lispro (HUMALOG) 100 UNIT/ML injection   Subcutaneous   Inject 10-20 Units into the skin See admin instructions. Per sliding scale 10-20 unit range 3 times daily         . INVESTIGATIONAL DRUG SIMPLE RECORD   Oral   Take 7.5 mg by mouth 2 (two) times daily.         Marland Kitchen lisinopril (PRINIVIL,ZESTRIL) 2.5 MG tablet   Oral   Take 1 tablet by mouth Daily.         Marland Kitchen loratadine (CLARITIN) 10 MG tablet   Oral   Take 1 tablet (10 mg total) by mouth daily.   30 tablet   6   . metoprolol succinate (TOPROL XL) 25 MG 24 hr tablet   Oral   Take 1 tablet (25 mg total) by mouth daily.   30 tablet   6   . omeprazole (PRILOSEC) 40 MG capsule   Oral   Take 80 mg by mouth daily.          . rosuvastatin (CRESTOR) 40 MG tablet   Oral   Take 1 tablet (40 mg total) by mouth daily.   30 tablet   11   . traMADol (ULTRAM) 50 MG tablet   Oral   Take 50 mg by mouth every 8 (eight) hours as needed.           BP 106/48  Pulse 78  Temp(Src) 97.5 F (36.4 C)  Resp 18  SpO2 96% Physical Exam  Nursing note and vitals reviewed. Constitutional: She appears well-developed and well-nourished.  HENT:  Head: Normocephalic and atraumatic.  Eyes: Conjunctivae are normal. Pupils are equal, round, and reactive to light.  Neck: Neck supple. No tracheal deviation present. No thyromegaly present.  Cardiovascular: Normal rate and regular rhythm.   No murmur heard. Pulmonary/Chest: Effort normal and breath sounds normal.  Abdominal: Soft. Bowel sounds are normal. She exhibits no distension. There is no tenderness.  Musculoskeletal: Normal range of motion. She exhibits no edema and no tenderness.  Neurological: She is alert. Coordination normal.  Skin: Skin  is warm and dry. No rash noted.  Psychiatric: She has a normal mood and affect.    ED Course  Procedures (including critical care time) Labs Review Labs Reviewed  CBC WITH DIFFERENTIAL   Imaging Review No results found.  EKG Interpretation    Date/Time:  Sunday June 14 2013 21:19:20 EST Ventricular Rate:  85 PR Interval:  168 QRS Duration: 87 QT Interval:  379 QTC Calculation: 451 R Axis:   8 Text Interpretation:  Sinus rhythm Nonspecific T abnormalities, lateral leads Since last tracing rate slower Confirmed by Winfred Leeds  MD, Philander Ake ([id number]) on 06/14/2013 9:28:59 PM           chest xray viewed by me Results for orders placed during the hospital encounter of 06/14/13  CBC WITH DIFFERENTIAL      Result Value Range   WBC 6.8  4.0 - 10.5 K/uL   RBC 3.78 (*) 3.87 - 5.11 MIL/uL   Hemoglobin 11.0 (*) 12.0 - 15.0 g/dL   HCT 33.3 (*) 36.0 - 46.0 %   MCV 88.1  78.0 - 100.0 fL   MCH 29.1  26.0 - 34.0 pg   MCHC 33.0  30.0 - 36.0 g/dL   RDW 12.7  11.5 - 15.5 %   Platelets 164  150 - 400 K/uL   Neutrophils Relative % 65  43 - 77 %   Neutro Abs 4.5  1.7 - 7.7 K/uL   Lymphocytes Relative 18  12 - 46 %   Lymphs Abs 1.2  0.7 - 4.0 K/uL   Monocytes Relative 12  3 - 12 %   Monocytes Absolute 0.8  0.1 - 1.0 K/uL   Eosinophils Relative 4  0 - 5 %   Eosinophils Absolute 0.3  0.0 - 0.7 K/uL   Basophils Relative 1  0 - 1 %   Basophils Absolute 0.0  0.0 - 0.1 K/uL  POCT I-STAT, CHEM 8      Result Value Range   Sodium 140  137 - 147 mEq/L   Potassium 4.7  3.7 - 5.3 mEq/L   Chloride 104  96 - 112 mEq/L   BUN 28 (*) 6 - 23 mg/dL   Creatinine, Ser 1.60 (*) 0.50 - 1.10 mg/dL   Glucose, Bld 156 (*) 70 - 99 mg/dL   Calcium, Ion 1.19  1.13 - 1.30 mmol/L   TCO2 25  0 - 100 mmol/L   Hemoglobin 10.9 (*) 12.0 - 15.0 g/dL   HCT 32.0 (*) 36.0 - 46.0 %  POCT I-STAT TROPONIN I      Result Value Range   Troponin i, poc 0.01  0.00 - 0.08 ng/mL   Comment 3            No results  found.  Patient is asymptomatic and pain-free after treatment with nitroglycerin paste. MDM  No diagnosis found. Spoke with Dr.Jacob Kelly plan 23 or observation telemetry Diagnosis #1 unstable angina #2 chronic renal insufficiency #3 hyperglycemia    Orlie Dakin, MD 06/14/13 2333

## 2013-06-14 NOTE — ED Notes (Signed)
Pt arrives via EMS, pt started having right sided cp at 1900, took 325 mg of asprin and 2 nitro with minimal relief. Pt started having sob, [place], and diaphoretic with the cp. Pt given 4 mg of zofran en route with relief. EKG done-unremarkable.

## 2013-06-15 ENCOUNTER — Encounter (HOSPITAL_COMMUNITY): Payer: Self-pay | Admitting: *Deleted

## 2013-06-15 ENCOUNTER — Observation Stay (HOSPITAL_COMMUNITY): Payer: Medicare Other

## 2013-06-15 ENCOUNTER — Encounter (HOSPITAL_COMMUNITY): Payer: Medicare Other

## 2013-06-15 DIAGNOSIS — I10 Essential (primary) hypertension: Secondary | ICD-10-CM

## 2013-06-15 DIAGNOSIS — R079 Chest pain, unspecified: Secondary | ICD-10-CM

## 2013-06-15 DIAGNOSIS — I200 Unstable angina: Secondary | ICD-10-CM

## 2013-06-15 LAB — GLUCOSE, CAPILLARY
Glucose-Capillary: 154 mg/dL — ABNORMAL HIGH (ref 70–99)
Glucose-Capillary: 253 mg/dL — ABNORMAL HIGH (ref 70–99)

## 2013-06-15 LAB — LIPID PANEL
Cholesterol: 163 mg/dL (ref 0–200)
HDL: 37 mg/dL — ABNORMAL LOW (ref >39)
LDL Cholesterol: 105 mg/dL — ABNORMAL HIGH (ref 0–99)
Total CHOL/HDL Ratio: 4.4 RATIO
Triglycerides: 104 mg/dL (ref <150)
VLDL: 21 mg/dL (ref 0–40)

## 2013-06-15 LAB — HEMOGLOBIN A1C
Hgb A1c MFr Bld: 9.9 % — ABNORMAL HIGH (ref <5.7)
Mean Plasma Glucose: 237 mg/dL — ABNORMAL HIGH (ref <117)

## 2013-06-15 LAB — PROTIME-INR
INR: 1.06 (ref 0.00–1.49)
Prothrombin Time: 13.6 seconds (ref 11.6–15.2)

## 2013-06-15 LAB — BASIC METABOLIC PANEL
BUN: 28 mg/dL — ABNORMAL HIGH (ref 6–23)
CO2: 28 mEq/L (ref 19–32)
Calcium: 8.9 mg/dL (ref 8.4–10.5)
Chloride: 104 mEq/L (ref 96–112)
Creatinine, Ser: 1.6 mg/dL — ABNORMAL HIGH (ref 0.50–1.10)
GFR calc Af Amer: 37 mL/min — ABNORMAL LOW (ref >90)
GFR calc non Af Amer: 32 mL/min — ABNORMAL LOW (ref >90)
Glucose, Bld: 158 mg/dL — ABNORMAL HIGH (ref 70–99)
Potassium: 5.6 mEq/L — ABNORMAL HIGH (ref 3.7–5.3)
Sodium: 142 mEq/L (ref 137–147)

## 2013-06-15 LAB — TSH: TSH: 1.466 u[IU]/mL (ref 0.350–4.500)

## 2013-06-15 LAB — TROPONIN I
Troponin I: <0.3 ng/mL (ref <0.30)
Troponin I: <0.3 ng/mL (ref <0.30)
Troponin I: <0.3 ng/mL (ref <0.30)

## 2013-06-15 LAB — PRO B NATRIURETIC PEPTIDE: Pro B Natriuretic peptide (BNP): 450.6 pg/mL — ABNORMAL HIGH (ref 0–125)

## 2013-06-15 MED ORDER — SODIUM CHLORIDE 0.9 % IJ SOLN: 3.0000 mL | INTRAMUSCULAR | Status: DC | PRN

## 2013-06-15 MED ORDER — AMLODIPINE BESYLATE 10 MG PO TABS
10.0000 mg | ORAL_TABLET | Freq: Every day | ORAL | Status: DC
  Administered 2013-06-15: 10 mg via ORAL
  Filled 2013-06-15: qty 1

## 2013-06-15 MED ORDER — TECHNETIUM TC 99M SESTAMIBI GENERIC - CARDIOLITE
10.0000 | Freq: Once | INTRAVENOUS | Status: AC | PRN
  Administered 2013-06-15: 10 via INTRAVENOUS

## 2013-06-15 MED ORDER — ACETAMINOPHEN 325 MG PO TABS: 650.0000 mg | ORAL_TABLET | ORAL | Status: DC | PRN

## 2013-06-15 MED ORDER — CITALOPRAM HYDROBROMIDE 20 MG PO TABS
20.0000 mg | ORAL_TABLET | Freq: Every day | ORAL | Status: DC
  Administered 2013-06-15: 20 mg via ORAL
  Filled 2013-06-15: qty 1

## 2013-06-15 MED ORDER — LORATADINE 10 MG PO TABS
10.0000 mg | ORAL_TABLET | Freq: Every day | ORAL | Status: DC
  Administered 2013-06-15: 10 mg via ORAL
  Filled 2013-06-15: qty 1

## 2013-06-15 MED ORDER — NITROGLYCERIN 0.4 MG SL SUBL: 0.4000 mg | SUBLINGUAL_TABLET | SUBLINGUAL | Status: DC | PRN

## 2013-06-15 MED ORDER — ASPIRIN 81 MG PO TBEC: 81.0000 mg | DELAYED_RELEASE_TABLET | Freq: Every day | ORAL | Status: DC

## 2013-06-15 MED ORDER — LISINOPRIL 2.5 MG PO TABS
2.5000 mg | ORAL_TABLET | Freq: Every day | ORAL | Status: DC
  Administered 2013-06-15: 2.5 mg via ORAL
  Filled 2013-06-15: qty 1

## 2013-06-15 MED ORDER — ONDANSETRON HCL 4 MG/2ML IJ SOLN: 4.0000 mg | Freq: Four times a day (QID) | INTRAMUSCULAR | Status: DC | PRN

## 2013-06-15 MED ORDER — PANTOPRAZOLE SODIUM 40 MG PO TBEC
40.0000 mg | DELAYED_RELEASE_TABLET | Freq: Every day | ORAL | Status: DC
  Administered 2013-06-15: 40 mg via ORAL
  Filled 2013-06-15: qty 1

## 2013-06-15 MED ORDER — SODIUM CHLORIDE 0.9 % IV SOLN: 250.0000 mL | INTRAVENOUS | Status: DC | PRN

## 2013-06-15 MED ORDER — ISOSORBIDE MONONITRATE ER 30 MG PO TB24: 30.0000 mg | ORAL_TABLET | Freq: Every day | ORAL | Status: DC

## 2013-06-15 MED ORDER — TRAMADOL HCL 50 MG PO TABS: 50.0000 mg | ORAL_TABLET | Freq: Three times a day (TID) | ORAL | Status: DC | PRN

## 2013-06-15 MED ORDER — REGADENOSON 0.4 MG/5ML IV SOLN
0.4000 mg | Freq: Once | INTRAVENOUS | Status: AC
  Administered 2013-06-15: 0.4 mg via INTRAVENOUS
  Filled 2013-06-15: qty 5

## 2013-06-15 MED ORDER — REGADENOSON 0.4 MG/5ML IV SOLN
INTRAVENOUS | Status: AC
  Filled 2013-06-15: qty 5

## 2013-06-15 MED ORDER — BUDESONIDE-FORMOTEROL FUMARATE 160-4.5 MCG/ACT IN AERO
2.0000 | INHALATION_SPRAY | Freq: Two times a day (BID) | RESPIRATORY_TRACT | Status: DC
  Filled 2013-06-15: qty 6

## 2013-06-15 MED ORDER — INSULIN GLARGINE 100 UNIT/ML ~~LOC~~ SOLN
50.0000 [IU] | Freq: Every day | SUBCUTANEOUS | Status: DC
  Administered 2013-06-15: 50 [IU] via SUBCUTANEOUS
  Filled 2013-06-15: qty 0.5

## 2013-06-15 MED ORDER — INSULIN ASPART 100 UNIT/ML ~~LOC~~ SOLN
10.0000 [IU] | Freq: Three times a day (TID) | SUBCUTANEOUS | Status: DC
  Administered 2013-06-15: 10 [IU] via SUBCUTANEOUS

## 2013-06-15 MED ORDER — ALBUTEROL SULFATE HFA 108 (90 BASE) MCG/ACT IN AERS: 2.0000 | INHALATION_SPRAY | Freq: Four times a day (QID) | RESPIRATORY_TRACT | Status: DC | PRN

## 2013-06-15 MED ORDER — TECHNETIUM TC 99M SESTAMIBI GENERIC - CARDIOLITE
30.0000 | Freq: Once | INTRAVENOUS | Status: AC | PRN
  Administered 2013-06-15: 30 via INTRAVENOUS

## 2013-06-15 MED ORDER — ATORVASTATIN CALCIUM 80 MG PO TABS
80.0000 mg | ORAL_TABLET | Freq: Every day | ORAL | Status: DC
  Administered 2013-06-15: 80 mg via ORAL
  Filled 2013-06-15 (×2): qty 1

## 2013-06-15 MED ORDER — SODIUM POLYSTYRENE SULFONATE 15 GM/60ML PO SUSP
15.0000 g | Freq: Once | ORAL | Status: AC
  Administered 2013-06-15: 15 g via ORAL
  Filled 2013-06-15: qty 60

## 2013-06-15 MED ORDER — CLOPIDOGREL BISULFATE 75 MG PO TABS
75.0000 mg | ORAL_TABLET | Freq: Every day | ORAL | Status: DC
  Administered 2013-06-15: 75 mg via ORAL
  Filled 2013-06-15: qty 1

## 2013-06-15 MED ORDER — METOPROLOL SUCCINATE ER 25 MG PO TB24
25.0000 mg | ORAL_TABLET | Freq: Every day | ORAL | Status: DC
  Administered 2013-06-15: 25 mg via ORAL
  Filled 2013-06-15: qty 1

## 2013-06-15 MED ORDER — ASPIRIN EC 81 MG PO TBEC: 81.0000 mg | DELAYED_RELEASE_TABLET | Freq: Every day | ORAL | Status: DC

## 2013-06-15 MED ORDER — DRONEDARONE HCL 400 MG PO TABS
400.0000 mg | ORAL_TABLET | Freq: Two times a day (BID) | ORAL | Status: DC
  Administered 2013-06-15: 400 mg via ORAL
  Filled 2013-06-15 (×3): qty 1

## 2013-06-15 MED ORDER — SODIUM CHLORIDE 0.9 % IJ SOLN: 3.0000 mL | Freq: Two times a day (BID) | INTRAMUSCULAR | Status: DC

## 2013-06-15 MED ORDER — ALBUTEROL SULFATE (2.5 MG/3ML) 0.083% IN NEBU: 2.5000 mg | INHALATION_SOLUTION | Freq: Four times a day (QID) | RESPIRATORY_TRACT | Status: DC | PRN

## 2013-06-15 MED ORDER — ALPRAZOLAM 0.25 MG PO TABS
0.2500 mg | ORAL_TABLET | Freq: Every evening | ORAL | Status: DC | PRN
Start: 2013-06-15 — End: 2013-06-15

## 2013-06-15 NOTE — Discharge Instructions (Signed)
Chest Pain (Nonspecific) °Chest pain has many causes. Your pain could be caused by something serious, such as a heart attack or a blood clot in the lungs. It could also be caused by something less serious, such as a chest bruise or a virus. Follow up with your doctor. More lab tests or other studies may be needed to find the cause of your pain. Most of the time, nonspecific chest pain will improve within 2 to 3 days of rest and mild pain medicine. °HOME CARE °· For chest bruises, you may put ice on the sore area for 15-20 minutes, 3-4 times a day. Do this only if it makes you or your child feel better. °· Put ice in a plastic bag. °· Place a towel between the skin and the bag. °· Rest for the next 2 to 3 days. °· Go back to work if the pain improves. °· See your doctor if the pain lasts longer than 1 to 2 weeks. °· Only take medicine as told by your doctor. °· Quit smoking if you smoke. °GET HELP RIGHT AWAY IF:  °· There is more pain or pain that spreads to the arm, neck, jaw, back, or belly (abdomen). °· You or your child has shortness of breath. °· You or your child coughs more than usual or coughs up blood. °· You or your child has very bad back or belly pain, feels sick to his or her stomach (nauseous), or throws up (vomits). °· You or your child has very bad weakness. °· You or your child passes out (faints). °· You or your child has a temperature by mouth above 102° F (38.9° C), not controlled by medicine. °Any of these problems may be serious and may be an emergency. Do not wait to see if the problems will go away. Get medical help right away. Call your local emergency services 911 in [place] Do not drive yourself to the hospital. °MAKE SURE YOU:  °· Understand these instructions. °· Will watch this condition. °· Will get help right away if you or your child is not doing well or gets worse. °Document Released: 11/14/2007 Document Revised: 08/20/2011 Document Reviewed: 11/14/2007 °[employer]® Patient Information  ©2014 [employer]. ° °

## 2013-06-15 NOTE — Progress Notes (Signed)
DAILY PROGRESS NOTE  Subjective:  Chest pain with similar features to prior episodes. LHC in 02/2013 showed patent grafts, however, questionable target for intervention in the native LCx.  Objective:  Temp:  [97.5 F (36.4 C)-97.8 F (36.6 C)] 97.8 F (36.6 C) (01/05 0326) Pulse Rate:  [74-85] 76 (01/05 0326) Resp:  [15-19] 18 (01/05 0326) BP: (104-133)/(45-54) 133/54 mmHg (01/05 0326) SpO2:  [92 %-98 %] 97 % (01/05 0326) Weight:  [217 lb 8 oz (98.657 kg)] 217 lb 8 oz (98.657 kg) (01/05 0326) Weight change:   Intake/Output from previous day:    Intake/Output from this shift:    Medications: Current Facility-Administered Medications  Medication Dose Route Frequency Provider Last Rate Last Dose  . 0.9 %  sodium chloride infusion  250 mL Intravenous PRN Jules Husbands, MD      . acetaminophen (TYLENOL) tablet 650 mg  650 mg Oral Q4H PRN Jules Husbands, MD      . albuterol (PROVENTIL HFA;VENTOLIN HFA) 108 (90 BASE) MCG/ACT inhaler 2 puff  2 puff Inhalation Q6H PRN Jules Husbands, MD      . albuterol (PROVENTIL) (2.5 MG/3ML) 0.083% nebulizer solution 2.5 mg  2.5 mg Nebulization Q6H PRN Jules Husbands, MD      . ALPRAZolam Duanne Moron) tablet 0.25 mg  0.25 mg Oral QHS PRN Jules Husbands, MD      . amLODipine (NORVASC) tablet 10 mg  10 mg Oral Daily Jules Husbands, MD      . Derrill Memo ON 06/16/2013] aspirin EC tablet 81 mg  81 mg Oral Daily Jules Husbands, MD      . atorvastatin (LIPITOR) tablet 80 mg  80 mg Oral q1800 Jules Husbands, MD      . budesonide-formoterol [facility]) 160-4.5 MCG/ACT inhaler 2 puff  2 puff Inhalation BID Jules Husbands, MD      . citalopram (CELEXA) tablet 20 mg  20 mg Oral Daily Jules Husbands, MD      . clopidogrel (PLAVIX) tablet 75 mg  75 mg Oral Q breakfast Jules Husbands, MD   75 mg at 06/15/13 0849  . dronedarone (MULTAQ) tablet 400 mg  400 mg Oral BID WC Jules Husbands, MD   400 mg at 06/15/13 0849  . insulin aspart (novoLOG) injection 10 Units  10 Units Subcutaneous TID WC Jules Husbands, MD       . insulin glargine (LANTUS) injection 50 Units  50 Units Subcutaneous Daily Jules Husbands, MD      . lisinopril (PRINIVIL,ZESTRIL) tablet 2.5 mg  2.5 mg Oral Daily Jules Husbands, MD      . loratadine (CLARITIN) tablet 10 mg  10 mg Oral Daily Jules Husbands, MD      . metoprolol succinate (TOPROL-XL) 24 hr tablet 25 mg  25 mg Oral Daily Jules Husbands, MD      . nitroGLYCERIN (NITROSTAT) SL tablet 0.4 mg  0.4 mg Sublingual Q5 Min x 3 PRN Jules Husbands, MD      . ondansetron [facility]) injection 4 mg  4 mg Intravenous Q6H PRN Jules Husbands, MD      . pantoprazole (PROTONIX) EC tablet 40 mg  40 mg Oral Daily Jules Husbands, MD      . sodium chloride 0.9 % injection 3 mL  3 mL Intravenous Q12H Jules Husbands, MD      . sodium chloride 0.9 % injection 3 mL  3 mL Intravenous PRN Jules Husbands, MD      . traMADol Veatrice Bourbon) tablet 50 mg  50 mg Oral  Q8H PRN Jules Husbands, MD        Physical Exam: General appearance: alert and no distress Neck: no carotid bruit and no JVD Lungs: clear to auscultation bilaterally Heart: regular rate and rhythm, S1, S2 normal, no murmur, click, rub or gallop Abdomen: soft, non-tender; bowel sounds normal; no masses,  no organomegaly Extremities: extremities normal, atraumatic, no cyanosis or edema Pulses: 2+ and symmetric Skin: Skin color, texture, turgor normal. No rashes or lesions Neurologic: Grossly normal  Lab Results: Results for orders placed during the hospital encounter of 06/14/13 (from the past 48 hour(s))  CBC WITH DIFFERENTIAL     Status: Abnormal   Collection Time    06/14/13 10:42 PM      Result Value Range   WBC 6.8  4.0 - 10.5 K/uL   RBC 3.78 (*) 3.87 - 5.11 MIL/uL   Hemoglobin 11.0 (*) 12.0 - 15.0 g/dL   HCT 33.3 (*) 36.0 - 46.0 %   MCV 88.1  78.0 - 100.0 fL   MCH 29.1  26.0 - 34.0 pg   MCHC 33.0  30.0 - 36.0 g/dL   RDW 12.7  11.5 - 15.5 %   Platelets 164  150 - 400 K/uL   Neutrophils Relative % 65  43 - 77 %   Neutro Abs 4.5  1.7 - 7.7 K/uL   Lymphocytes  Relative 18  12 - 46 %   Lymphs Abs 1.2  0.7 - 4.0 K/uL   Monocytes Relative 12  3 - 12 %   Monocytes Absolute 0.8  0.1 - 1.0 K/uL   Eosinophils Relative 4  0 - 5 %   Eosinophils Absolute 0.3  0.0 - 0.7 K/uL   Basophils Relative 1  0 - 1 %   Basophils Absolute 0.0  0.0 - 0.1 K/uL  POCT I-STAT TROPONIN I     Status: None   Collection Time    06/14/13 10:55 PM      Result Value Range   Troponin i, poc 0.01  0.00 - 0.08 ng/mL   Comment 3            Comment: Due to the release kinetics of cTnI,     a negative result within the first hours     of the onset of symptoms does not rule out     myocardial infarction with certainty.     If myocardial infarction is still suspected,     repeat the test at appropriate intervals.  POCT I-STAT, CHEM 8     Status: Abnormal   Collection Time    06/14/13 10:57 PM      Result Value Range   Sodium 140  137 - 147 mEq/L   Potassium 4.7  3.7 - 5.3 mEq/L   Chloride 104  96 - 112 mEq/L   BUN 28 (*) 6 - 23 mg/dL   Creatinine, Ser 1.60 (*) 0.50 - 1.10 mg/dL   Glucose, Bld 156 (*) 70 - 99 mg/dL   Calcium, Ion 1.19  1.13 - 1.30 mmol/L   TCO2 25  0 - 100 mmol/L   Hemoglobin 10.9 (*) 12.0 - 15.0 g/dL   HCT 32.0 (*) 36.0 - 46.0 %  TROPONIN I     Status: None   Collection Time    06/15/13  5:35 AM      Result Value Range   Troponin I <0.30  <0.30 ng/mL   Comment:            Due to  the release kinetics of cTnI,     a negative result within the first hours     of the onset of symptoms does not rule out     myocardial infarction with certainty.     If myocardial infarction is still suspected,     repeat the test at appropriate intervals.  PROTIME-INR     Status: None   Collection Time    06/15/13  5:35 AM      Result Value Range   Prothrombin Time 13.6  11.6 - 15.2 seconds   INR 1.06  0.00 - 1.49  PRO B NATRIURETIC PEPTIDE     Status: Abnormal   Collection Time    06/15/13  5:35 AM      Result Value Range   Pro B Natriuretic peptide (BNP) 450.6  (*) 0 - 125 pg/mL  BASIC METABOLIC PANEL     Status: Abnormal   Collection Time    06/15/13  5:35 AM      Result Value Range   Sodium 142  137 - 147 mEq/L   Comment: Please note change in reference range.   Potassium 5.6 (*) 3.7 - 5.3 mEq/L   Comment: Please note change in reference range.   Chloride 104  96 - 112 mEq/L   CO2 28  19 - 32 mEq/L   Glucose, Bld 158 (*) 70 - 99 mg/dL   BUN 28 (*) 6 - 23 mg/dL   Creatinine, Ser 1.60 (*) 0.50 - 1.10 mg/dL   Calcium 8.9  8.4 - 10.5 mg/dL   GFR calc non Af Amer 32 (*) >90 mL/min   GFR calc Af Amer 37 (*) >90 mL/min   Comment: (NOTE)     The eGFR has been calculated using the CKD EPI equation.     This calculation has not been validated in all clinical situations.     eGFR's persistently <90 mL/min signify possible Chronic Kidney     Disease.  LIPID PANEL     Status: Abnormal   Collection Time    06/15/13  5:35 AM      Result Value Range   Cholesterol 163  0 - 200 mg/dL   Triglycerides 104  <150 mg/dL   HDL 37 (*) >39 mg/dL   Total CHOL/HDL Ratio 4.4     VLDL 21  0 - 40 mg/dL   LDL Cholesterol 105 (*) 0 - 99 mg/dL   Comment:            Total Cholesterol/HDL:CHD Risk     Coronary Heart Disease Risk Table                         Men   Women      1/2 Average Risk   3.4   3.3      Average Risk       5.0   4.4      2 X Average Risk   9.6   7.1      3 X Average Risk  23.4   11.0                Use the calculated Patient Ratio     above and the CHD Risk Table     to determine the patient's CHD Risk.                ATP III CLASSIFICATION (LDL):      <100     mg/dL  Optimal      100-129  mg/dL   Near or Above                        Optimal      130-159  mg/dL   Borderline      160-189  mg/dL   High      >190     mg/dL   Very High  GLUCOSE, CAPILLARY     Status: Abnormal   Collection Time    06/15/13  7:50 AM      Result Value Range   Glucose-Capillary 154 (*) 70 - 99 mg/dL   Comment 1 Documented in Chart     Comment 2 Notify  RN    TROPONIN I     Status: None   Collection Time    06/15/13  9:00 AM      Result Value Range   Troponin I <0.30  <0.30 ng/mL   Comment:            Due to the release kinetics of cTnI,     a negative result within the first hours     of the onset of symptoms does not rule out     myocardial infarction with certainty.     If myocardial infarction is still suspected,     repeat the test at appropriate intervals.    Imaging: Dg Chest Portable 1 View  06/14/2013   CLINICAL DATA:  Right-sided chest pain  EXAM: PORTABLE CHEST - 1 VIEW  COMPARISON:  02/23/2013  FINDINGS: Changes of median sternotomy for CABG. Patient's known aortic valve replacement is not well visualized on this frontal portable view. Stable heart and mediastinal contours. Pulmonary vascularity within normal limits. No focal airspace opacity, effusion, or pneumothorax. Chronic deformity of the right humeral neck, status post remote fracture in 2011 again noted. No acute bony abnormality.  IMPRESSION: Stable chest radiograph.  No acute findings.   Electronically Signed   By: Curlene Dolphin M.D.   On: 06/14/2013 23:54    Assessment:  Principal Problem:   Unstable angina Active Problems:   HYPERLIPIDEMIA   HYPERTENSION   SOB (shortness of breath)   Renal insufficiency, mild   Diabetes mellitus, type 2   CAD (coronary artery disease)   Atrial tachycardia   Chest pain   Plan:  1. Ruled-out for ACS.  Stress test today was negative for ischemia. Will add low dose imdur 30 mg daily. [place] for discharge home today. Follow-up with Dr. Aundra Dubin.  Time Spent Directly with Patient:  15 minutes  Length of Stay:  LOS: 1 day   Pixie Casino, MD, [facility] Attending Cardiologist [facility]  Shakiyah Cirilo C 06/15/2013, 10:42 AM

## 2013-06-15 NOTE — H&P (Signed)
Hannah [name] is an 68 y.o. female.   Chief Complaint: chest pain HPI: Hannah [name] is a 68 yo woman with PMH of CAD, CABG '98, redo '09, bioprosthetic AVR, chronic kidney disease, atrial fibrillation followed by Dr. Aundra Dubin seen 06/09/13 who had tachypalpitations in [place] with some chest pain and hypertension with significant troponin elevation at that time. The NSTEMI was thought to be type II from hypertension with LHC without infarct related artery and patent grafts. She had a holter monitor that revealed long runs of atrial tachycardia for which Toprol XL was initiated. At time of last visit she was doing fairly well except for some stable DOE with stairs/inclines, recent 12/14 sleep study without OSA. Today she presents with fairly sudden onset of anterior chest pain, non-radiating but fairly diffuse chest pressure, right > left side similar to pain in September. She received aspirin 324 mg and some relief with SL NTG x2.  She is currently chest pain free. She otherwise feels well. No sick contacts or flu-like symptoms.   Past Medical History  Diagnosis Date  . Hypertension   . Hyperlipidemia   . Atrial fibrillation     in sinus on Multaq  . CHF (congestive heart failure)     EF is low normal at 50 to 55% per echo in Jan. 2011  . Renal insufficiency, mild   . Dyspnea     chronic; followed by Dr. Lamonte Sakai  . Nonischemic cardiomyopathy   . Anemia   . Diabetes mellitus   . Iron deficiency anemia   . High risk medication use   . CAD (coronary artery disease)     Remote CABG in 1998 with redo in 2009  . S/P AVR 2009  . Amiodarone toxicity   . Hearing loss of right ear   . Stroke     Past Surgical History  Procedure Laterality Date  . Coronary artery bypass graft  1998 & 2009    Had LIMA to DX/LAD, SVG to 2 marginal branches and SVG to [facility] originally; SVG to 3rd OM and PD at time of redo  . Aortic valve replacement  2009    #35mm [facility] Ease pericardial valve  . Cardiac  catheterization  03/23/2008    Pre-redo CABG: L main OK, LAD (T), CFX (T), OM1 99%, RCA (T), LIMA-LAD OK, SVG-OM(?3) OK w/ little florw to OM2, SVG-RCA OK. EF NL    Family History  Problem Relation Age of Onset  . Heart disease Mother   . Heart disease Father   . Heart failure Father    Social History:  reports that she has never smoked. She has never used smokeless tobacco. She reports that she does not drink alcohol or use illicit drugs.  Allergies:  Allergies  Allergen Reactions  . Amoxicillin Rash  . Tetracycline Rash   No current facility-administered medications for this encounter.   Current Outpatient Prescriptions  Medication Sig Dispense Refill  . albuterol (PROVENTIL) (2.5 MG/3ML) 0.083% nebulizer solution Take 3 mLs (2.5 mg total) by nebulization every 6 (six) hours as needed for wheezing or shortness of breath.  180 mL  0  . albuterol (PROVENTIL,VENTOLIN) 90 MCG/ACT inhaler Inhale 2 puffs into the lungs every 6 (six) hours as needed. For shortness of breath      . ALPRAZolam (XANAX) 0.25 MG tablet Take 1 tablet (0.25 mg total) by mouth at bedtime as needed for sleep.  30 tablet  0  . amLODipine (NORVASC) 10 MG tablet Take 10 mg  by mouth daily.      . budesonide-formoterol (SYMBICORT) 160-4.5 MCG/ACT inhaler Inhale 2 puffs into the lungs 2 (two) times daily.  1 Inhaler  6  . Calcium Carbonate-Vit D-Min (CALTRATE PLUS PO) Take 1 tablet by mouth daily.      . citalopram (CELEXA) 20 MG tablet Take 20 mg by mouth daily.      . clopidogrel (PLAVIX) 75 MG tablet Take 75 mg by mouth daily.      Marland Kitchen dronedarone (MULTAQ) 400 MG tablet Take 400 mg by mouth 2 (two) times daily with a meal.      . insulin glargine (LANTUS) 100 UNIT/ML injection Inject 50 Units into the skin daily.       . insulin lispro (HUMALOG) 100 UNIT/ML injection Inject 10-20 Units into the skin See admin instructions. Per sliding scale 10-20 unit range 3 times daily      . lisinopril (PRINIVIL,ZESTRIL) 2.5 MG  tablet Take 1 tablet by mouth Daily.      Marland Kitchen loratadine (CLARITIN) 10 MG tablet Take 1 tablet (10 mg total) by mouth daily.  30 tablet  6  . metoprolol succinate (TOPROL XL) 25 MG 24 hr tablet Take 1 tablet (25 mg total) by mouth daily.  30 tablet  6  . omeprazole (PRILOSEC) 40 MG capsule Take 40 mg by mouth 2 (two) times daily.      . rosuvastatin (CRESTOR) 40 MG tablet Take 1 tablet (40 mg total) by mouth daily.  30 tablet  11  . traMADol (ULTRAM) 50 MG tablet Take 50 mg by mouth every 8 (eight) hours as needed.          (Not in a hospital admission)  Results for orders placed during the hospital encounter of 06/14/13 (from the past 48 hour(s))  CBC WITH DIFFERENTIAL     Status: Abnormal   Collection Time    06/14/13 10:42 PM      Result Value Range   WBC 6.8  4.0 - 10.5 K/uL   RBC 3.78 (*) 3.87 - 5.11 MIL/uL   Hemoglobin 11.0 (*) 12.0 - 15.0 g/dL   HCT 33.3 (*) 36.0 - 46.0 %   MCV 88.1  78.0 - 100.0 fL   MCH 29.1  26.0 - 34.0 pg   MCHC 33.0  30.0 - 36.0 g/dL   RDW 12.7  11.5 - 15.5 %   Platelets 164  150 - 400 K/uL   Neutrophils Relative % 65  43 - 77 %   Neutro Abs 4.5  1.7 - 7.7 K/uL   Lymphocytes Relative 18  12 - 46 %   Lymphs Abs 1.2  0.7 - 4.0 K/uL   Monocytes Relative 12  3 - 12 %   Monocytes Absolute 0.8  0.1 - 1.0 K/uL   Eosinophils Relative 4  0 - 5 %   Eosinophils Absolute 0.3  0.0 - 0.7 K/uL   Basophils Relative 1  0 - 1 %   Basophils Absolute 0.0  0.0 - 0.1 K/uL  POCT I-STAT TROPONIN I     Status: None   Collection Time    06/14/13 10:55 PM      Result Value Range   Troponin i, poc 0.01  0.00 - 0.08 ng/mL   Comment 3            Comment: Due to the release kinetics of cTnI,     a negative result within the first hours     of the onset of symptoms  does not rule out     myocardial infarction with certainty.     If myocardial infarction is still suspected,     repeat the test at appropriate intervals.  POCT I-STAT, CHEM 8     Status: Abnormal   Collection  Time    06/14/13 10:57 PM      Result Value Range   Sodium 140  137 - 147 mEq/L   Potassium 4.7  3.7 - 5.3 mEq/L   Chloride 104  96 - 112 mEq/L   BUN 28 (*) 6 - 23 mg/dL   Creatinine, Ser 1.60 (*) 0.50 - 1.10 mg/dL   Glucose, Bld 156 (*) 70 - 99 mg/dL   Calcium, Ion 1.19  1.13 - 1.30 mmol/L   TCO2 25  0 - 100 mmol/L   Hemoglobin 10.9 (*) 12.0 - 15.0 g/dL   HCT 32.0 (*) 36.0 - 46.0 %   Dg Chest Portable 1 View  06/14/2013   CLINICAL DATA:  Right-sided chest pain  EXAM: PORTABLE CHEST - 1 VIEW  COMPARISON:  02/23/2013  FINDINGS: Changes of median sternotomy for CABG. Patient's known aortic valve replacement is not well visualized on this frontal portable view. Stable heart and mediastinal contours. Pulmonary vascularity within normal limits. No focal airspace opacity, effusion, or pneumothorax. Chronic deformity of the right humeral neck, status post remote fracture in 2011 again noted. No acute bony abnormality.  IMPRESSION: Stable chest radiograph.  No acute findings.   Electronically Signed   By: Curlene Dolphin M.D.   On: 06/14/2013 23:54    Review of Systems  Constitutional: Negative for fever, chills and malaise/fatigue.  HENT: Negative for ear discharge.   Eyes: Negative for double vision and pain.  Respiratory: Positive for shortness of breath. Negative for cough.   Cardiovascular: Positive for chest pain and leg swelling. Negative for palpitations.  Gastrointestinal: Negative for nausea and abdominal pain.  Genitourinary: Negative for dysuria and hematuria.  Musculoskeletal: Negative for back pain and myalgias.  Skin: Negative for rash.  Neurological: Negative for dizziness, tingling, tremors and headaches.  Endo/Heme/Allergies: Negative for polydipsia. Bruises/bleeds easily.  Psychiatric/Behavioral: Negative for depression, suicidal ideas and substance abuse.    Blood pressure 110/50, pulse 77, temperature 97.5 F (36.4 C), resp. rate 19, SpO2 97.00%. Physical Exam  Nursing  note and vitals reviewed. Constitutional: She is oriented to person, place, and time. She appears well-developed and well-nourished. No distress.  HENT:  Head: Normocephalic and atraumatic.  Nose: Nose normal.  Mouth/Throat: Oropharynx is clear and moist. No oropharyngeal exudate.  Eyes: Conjunctivae and EOM are normal. Pupils are equal, round, and reactive to light. No scleral icterus.  Neck: Normal range of motion. Neck supple. No tracheal deviation present.  Respiratory: Effort normal and breath sounds normal. No respiratory distress. She has no wheezes. She has no rales.  GI: Soft. Bowel sounds are normal. She exhibits no distension. There is no tenderness. There is no rebound.  Musculoskeletal: Normal range of motion. She exhibits edema.  Trace edema   Neurological: She is alert and oriented to person, place, and time. No cranial nerve deficit. Coordination normal.  Skin: Skin is warm and dry. No rash noted. She is not diaphoretic. No erythema.  Psychiatric: She has a normal mood and affect. Her behavior is normal. Thought content normal.    Labs reviewed; na 140, K 4.7, bun/cr 28/1.6, Trop I 0.0.1, plt 164, h/h 10.9/32, wbc 6.8 Chest x-ray: prior CABG, no acute process 9/13 Nuclear Lexiscan negative 9/14 Echo:  EF 50-55%, bioprosthetic valve seated well ECG NSR: nonspecific t-wave unchanged 9/14 LHC: Severe native vessel CAD. Patent grafts with some nonobstructive CAD, stent in sequential SVG patent with some disease in ostium, high grade LCx AV groove stable from '09 without grafted OM - thought for potential PCI of native LCx if recurrent symptoms.    Problem List Chest Pain Coronary Artery Disease with CABG '98, '09 Bioprosthetic AVR Hypertension, Dyslipidemia T2DM Chronic Kidney Disease  Assessment/Plan 68 yo woman with known CAD, s/p CABG and redo CABG with bioprosthetic AVR, hypertension, dyslipidemia, T2DM, chronic kidney disease who presents with chest pain. Differential  is musculoskeletal, atypical, anginal pain (ACS/UA), pericarditis, pleuritis among other etiologies. Initial troponins negative, and known native disease. Her September 2014 LHC revealed patent grafts but significant native disease with potential target of native LCx mentioned in previous cath. Will observe overnight, keep NPO and review cath films in the morning to see if this lesion could be contributing to current symptomology.  - telemetry, NPO  - aspirin 81 mg daily, atorvastatin 80 mg, home plavix 75 mg daily - continue lisinopril 2.5 mg; toprol XL, amlodipine 10 mg  - if LHC not amenable and biomarkers remain negative then can consider addition of imdur - continue insulin home regimen - TSH, BNP, lipid panel, hba1c   Earle Burson 06/15/2013, 1:04 AM

## 2013-06-15 NOTE — Progress Notes (Signed)
UR completed 

## 2013-06-15 NOTE — Progress Notes (Signed)
Nitro patch removed for stress test at 1200. As per Geannie Risen PA placed old patch back on. Patient aware to have 3W nurse to replace when she gets back to her room.

## 2013-06-15 NOTE — Progress Notes (Signed)
Reviewed discharge instructions with patient and she stated her understanding.  Discharged home with husband via wheelchair.  Sanda Linger

## 2013-06-15 NOTE — Discharge Summary (Signed)
Physician Discharge Summary  Patient ID: Hannah [name] MRN: [id number] DOB/AGE: 1945-09-04 68 y.o.  Admit date: 06/14/2013 Discharge date: 06/15/2013  Admission Diagnoses: Unstable Angina  Discharge Diagnoses:  Principal Problem:   Unstable angina Active Problems:   HYPERLIPIDEMIA   HYPERTENSION   SOB (shortness of breath)   Renal insufficiency, mild   Diabetes mellitus, type 2   CAD (coronary artery disease)   Atrial tachycardia   Chest pain   Discharged Condition: stable  Hospital Course: Ms. Kieschnick is a 68 yo woman with PMH of CAD, CABG '98, redo '09, bioprosthetic AVR, chronic kidney disease and atrial fibrillation, followed by Dr. Aundra Dubin. In September 2014, she was admitted for evaluation of palpitations, chest pain and hypertension. She had significant troponin elevation at that time. The NSTEMI was thought to be type II from hypertension with LHC without infarct related artery and patent grafts. She had a holter monitor that revealed long runs of atrial tachycardia for which Toprol XL was initiated. At time of last visit she was doing fairly well except for some stable DOE with stairs/inclines. Recent 12/14 sleep study without OSA.   She presented to [facility] on 06/14/12 with a complaint of sudden onset of anterior chest pain, non-radiating but fairly diffuse chest pressure, right > left side, similar to her pain in September. She received aspirin 324 mg and she had some relief with SL NTG x 2. Cardiac enzymes were cycled and she ruled out for MI with negative troponoins x 3. She underwent nuclear stress testing, which demonstrated no evidence of ischemia. Wall motion was normal. LVEF was estimated at 72%. Her chest pain ultimately resolved. She was last seen and examined by Dr. Debara Pickett, who determined she was stable for discharge home. Her lisinopril was discontinued at time of discharge due to hyperkalemia w/ K of 5.6. She was given kayexalate prior to discharge and was ordered to get a  BNP, as an outpatient, to reassess potassium levels. She will f/u in clinic with Dr. Aundra Dubin.    Consults: None  Significant Diagnostic Studies:   NST 06/15/13 FINDINGS: Normal resting EKG. Slight ST depressions noted in aVL after administration of lexiscan. Mild shortness of breath and nausea resolved after the test. EKG is nondiagnostic for ischemia. TID ratio 1.05. Lung-heart ratio 0.43. Normal ventricular chamber size.  IMPRESSION: No evidence for ischemia. Normal wall motion. LVEF 72%.   Treatments: See Hospital Course  Discharge Exam: Blood pressure 134/50, pulse 70, temperature 98.2 F (36.8 C), temperature source Oral, resp. rate 17, height 5\' 5"  (1.651 m), weight 217 lb 8 oz (98.657 kg), SpO2 100.00%.   Disposition: 01-Home or Self Care  Discharge Orders   Future Appointments Provider Department Dept Phone   07/16/2013 1:45 PM Larey Dresser, MD [place] [facility] [phone]   08/31/2013 8:00 AM [name], NP [facility] [phone]   Future Orders Complete By Expires   Diet - low sodium heart healthy  As directed    Increase activity slowly  As directed        Medication List         albuterol (2.5 MG/3ML) 0.083% nebulizer solution  Commonly known as:  PROVENTIL  Take 3 mLs (2.5 mg total) by nebulization every 6 (six) hours as needed for wheezing or shortness of breath.     albuterol 90 MCG/ACT inhaler  Commonly known as:  PROVENTIL,VENTOLIN  Inhale 2 puffs into the lungs every 6 (six) hours as needed. For shortness  of breath     ALPRAZolam 0.25 MG tablet  Commonly known as:  XANAX  Take 1 tablet (0.25 mg total) by mouth at bedtime as needed for sleep.     amLODipine 10 MG tablet  Commonly known as:  NORVASC  Take 10 mg by mouth daily.     aspirin 81 MG EC tablet  Take 1 tablet (81 mg total) by mouth daily.  Start taking on:  06/16/2013     budesonide-formoterol 160-4.5 MCG/ACT inhaler  Commonly known as:   SYMBICORT  Inhale 2 puffs into the lungs 2 (two) times daily.     CALTRATE PLUS PO  Take 1 tablet by mouth daily.     citalopram 20 MG tablet  Commonly known as:  CELEXA  Take 20 mg by mouth daily.     clopidogrel 75 MG tablet  Commonly known as:  PLAVIX  Take 75 mg by mouth daily.     dronedarone 400 MG tablet  Commonly known as:  MULTAQ  Take 400 mg by mouth 2 (two) times daily with a meal.     insulin glargine 100 UNIT/ML injection  Commonly known as:  LANTUS  Inject 50 Units into the skin daily.     insulin lispro 100 UNIT/ML injection  Commonly known as:  HUMALOG  Inject 10-20 Units into the skin See admin instructions. Per sliding scale 10-20 unit range 3 times daily     isosorbide mononitrate 30 MG 24 hr tablet  Commonly known as:  IMDUR  Take 1 tablet (30 mg total) by mouth daily.  Start taking on:  06/16/2013     lisinopril 2.5 MG tablet  Commonly known as:  PRINIVIL,ZESTRIL  Take 1 tablet by mouth Daily.     loratadine 10 MG tablet  Commonly known as:  CLARITIN  Take 1 tablet (10 mg total) by mouth daily.     metoprolol succinate 25 MG 24 hr tablet  Commonly known as:  TOPROL XL  Take 1 tablet (25 mg total) by mouth daily.     omeprazole 40 MG capsule  Commonly known as:  PRILOSEC  Take 40 mg by mouth 2 (two) times daily.     rosuvastatin 40 MG tablet  Commonly known as:  CRESTOR  Take 1 tablet (40 mg total) by mouth daily.     traMADol 50 MG tablet  Commonly known as:  ULTRAM  Take 50 mg by mouth every 8 (eight) hours as needed.           Follow-up Information   Follow up with Loralie Champagne, MD On 07/16/2013. (1:45 pm )    Specialty:  Cardiology   Contact information:   [id number] [place] [address] [phone]      TIME SPENT ON DISCHARGE, INCLUDING PHYSICIAN TIME: >30 MINUTES  Signed: Lyda Jester 06/15/2013, 4:35 PM

## 2013-06-16 ENCOUNTER — Other Ambulatory Visit: Payer: Self-pay | Admitting: Cardiology

## 2013-06-18 ENCOUNTER — Other Ambulatory Visit: Payer: Self-pay | Admitting: Cardiology

## 2013-06-18 ENCOUNTER — Ambulatory Visit (INDEPENDENT_AMBULATORY_CARE_PROVIDER_SITE_OTHER): Payer: Medicare Other | Admitting: *Deleted

## 2013-06-18 DIAGNOSIS — I10 Essential (primary) hypertension: Secondary | ICD-10-CM

## 2013-06-18 LAB — BASIC METABOLIC PANEL
BUN: 26 mg/dL — ABNORMAL HIGH (ref 6–23)
CO2: 29 mEq/L (ref 19–32)
Calcium: 8.9 mg/dL (ref 8.4–10.5)
Chloride: 105 mEq/L (ref 96–112)
Creatinine, Ser: 1.6 mg/dL — ABNORMAL HIGH (ref 0.4–1.2)
GFR: 35.36 mL/min — ABNORMAL LOW (ref >60.00)
Glucose, Bld: 207 mg/dL — ABNORMAL HIGH (ref 70–99)
Potassium: 4.3 mEq/L (ref 3.5–5.1)
Sodium: 140 mEq/L (ref 135–145)

## 2013-06-26 ENCOUNTER — Other Ambulatory Visit: Payer: Self-pay | Admitting: Cardiology

## 2013-07-13 ENCOUNTER — Emergency Department (HOSPITAL_COMMUNITY)
Admission: EM | Admit: 2013-07-13 | Discharge: 2013-07-13 | Disposition: A | Discharge status: Home / Self Care | Payer: Medicare Other | Attending: Emergency Medicine | Admitting: Emergency Medicine

## 2013-07-13 ENCOUNTER — Encounter (HOSPITAL_COMMUNITY): Payer: Self-pay | Admitting: Emergency Medicine

## 2013-07-13 DIAGNOSIS — Z8673 Personal history of transient ischemic attack (TIA), and cerebral infarction without residual deficits: Secondary | ICD-10-CM | POA: Insufficient documentation

## 2013-07-13 DIAGNOSIS — Z87448 Personal history of other diseases of urinary system: Secondary | ICD-10-CM | POA: Insufficient documentation

## 2013-07-13 DIAGNOSIS — R55 Syncope and collapse: Secondary | ICD-10-CM | POA: Insufficient documentation

## 2013-07-13 DIAGNOSIS — E119 Type 2 diabetes mellitus without complications: Secondary | ICD-10-CM | POA: Insufficient documentation

## 2013-07-13 DIAGNOSIS — Z951 Presence of aortocoronary bypass graft: Secondary | ICD-10-CM | POA: Insufficient documentation

## 2013-07-13 DIAGNOSIS — Z7982 Long term (current) use of aspirin: Secondary | ICD-10-CM | POA: Insufficient documentation

## 2013-07-13 DIAGNOSIS — I471 Supraventricular tachycardia: Secondary | ICD-10-CM

## 2013-07-13 DIAGNOSIS — Z8669 Personal history of other diseases of the nervous system and sense organs: Secondary | ICD-10-CM | POA: Insufficient documentation

## 2013-07-13 DIAGNOSIS — I509 Heart failure, unspecified: Secondary | ICD-10-CM | POA: Insufficient documentation

## 2013-07-13 DIAGNOSIS — IMO0002 Reserved for concepts with insufficient information to code with codable children: Secondary | ICD-10-CM | POA: Insufficient documentation

## 2013-07-13 DIAGNOSIS — Z9889 Other specified postprocedural states: Secondary | ICD-10-CM | POA: Insufficient documentation

## 2013-07-13 DIAGNOSIS — T50905A Adverse effect of unspecified drugs, medicaments and biological substances, initial encounter: Secondary | ICD-10-CM

## 2013-07-13 DIAGNOSIS — Z794 Long term (current) use of insulin: Secondary | ICD-10-CM | POA: Insufficient documentation

## 2013-07-13 DIAGNOSIS — T463X5A Adverse effect of coronary vasodilators, initial encounter: Secondary | ICD-10-CM | POA: Insufficient documentation

## 2013-07-13 DIAGNOSIS — Z954 Presence of other heart-valve replacement: Secondary | ICD-10-CM | POA: Insufficient documentation

## 2013-07-13 DIAGNOSIS — Z7902 Long term (current) use of antithrombotics/antiplatelets: Secondary | ICD-10-CM | POA: Insufficient documentation

## 2013-07-13 DIAGNOSIS — Z862 Personal history of diseases of the blood and blood-forming organs and certain disorders involving the immune mechanism: Secondary | ICD-10-CM | POA: Insufficient documentation

## 2013-07-13 DIAGNOSIS — R61 Generalized hyperhidrosis: Secondary | ICD-10-CM | POA: Insufficient documentation

## 2013-07-13 DIAGNOSIS — I2510 Atherosclerotic heart disease of native coronary artery without angina pectoris: Secondary | ICD-10-CM | POA: Insufficient documentation

## 2013-07-13 DIAGNOSIS — Z79899 Other long term (current) drug therapy: Secondary | ICD-10-CM | POA: Insufficient documentation

## 2013-07-13 DIAGNOSIS — E785 Hyperlipidemia, unspecified: Secondary | ICD-10-CM | POA: Insufficient documentation

## 2013-07-13 DIAGNOSIS — I4891 Unspecified atrial fibrillation: Secondary | ICD-10-CM | POA: Insufficient documentation

## 2013-07-13 DIAGNOSIS — I10 Essential (primary) hypertension: Secondary | ICD-10-CM | POA: Insufficient documentation

## 2013-07-13 DIAGNOSIS — Z952 Presence of prosthetic heart valve: Secondary | ICD-10-CM

## 2013-07-13 LAB — COMPREHENSIVE METABOLIC PANEL
ALT: 18 U/L (ref 0–35)
AST: 19 U/L (ref 0–37)
Albumin: 3.1 g/dL — ABNORMAL LOW (ref 3.5–5.2)
Alkaline Phosphatase: 62 U/L (ref 39–117)
BUN: 27 mg/dL — ABNORMAL HIGH (ref 6–23)
CO2: 24 mEq/L (ref 19–32)
Calcium: 9.1 mg/dL (ref 8.4–10.5)
Chloride: 105 mEq/L (ref 96–112)
Creatinine, Ser: 1.28 mg/dL — ABNORMAL HIGH (ref 0.50–1.10)
GFR calc Af Amer: 49 mL/min — ABNORMAL LOW (ref >90)
GFR calc non Af Amer: 42 mL/min — ABNORMAL LOW (ref >90)
Glucose, Bld: 102 mg/dL — ABNORMAL HIGH (ref 70–99)
Potassium: 4.1 mEq/L (ref 3.7–5.3)
Sodium: 143 mEq/L (ref 137–147)
Total Bilirubin: 0.4 mg/dL (ref 0.3–1.2)
Total Protein: 6.9 g/dL (ref 6.0–8.3)

## 2013-07-13 LAB — CBC WITH DIFFERENTIAL/PLATELET
Basophils Absolute: 0.1 10*3/uL (ref 0.0–0.1)
Basophils Relative: 1 % (ref 0–1)
Eosinophils Absolute: 0.6 10*3/uL (ref 0.0–0.7)
Eosinophils Relative: 6 % — ABNORMAL HIGH (ref 0–5)
HCT: 34 % — ABNORMAL LOW (ref 36.0–46.0)
Hemoglobin: 11.5 g/dL — ABNORMAL LOW (ref 12.0–15.0)
Lymphocytes Relative: 13 % (ref 12–46)
Lymphs Abs: 1.4 10*3/uL (ref 0.7–4.0)
MCH: 29.6 pg (ref 26.0–34.0)
MCHC: 33.8 g/dL (ref 30.0–36.0)
MCV: 87.6 fL (ref 78.0–100.0)
Monocytes Absolute: 1.2 10*3/uL — ABNORMAL HIGH (ref 0.1–1.0)
Monocytes Relative: 12 % (ref 3–12)
Neutro Abs: 7.4 10*3/uL (ref 1.7–7.7)
Neutrophils Relative %: 69 % (ref 43–77)
Platelets: 210 10*3/uL (ref 150–400)
RBC: 3.88 MIL/uL (ref 3.87–5.11)
RDW: 13 % (ref 11.5–15.5)
WBC: 10.6 10*3/uL — ABNORMAL HIGH (ref 4.0–10.5)

## 2013-07-13 LAB — TROPONIN I: Troponin I: <0.3 ng/mL (ref <0.30)

## 2013-07-13 MED ORDER — SODIUM CHLORIDE 0.9 % IV SOLN
INTRAVENOUS | Status: DC
  Administered 2013-07-13: 17:00:00 via INTRAVENOUS

## 2013-07-13 NOTE — ED Notes (Addendum)
Pt arrived by [facility] with c/o near syncopal episode. Pt was taking her grandson to the doctors office because he was not feeling well and while checking grandson in she became dizzy, diaphoretic, pale, nauseated and weak. Denies any pain at this time. Pt did have an episode the first week of January with CP and was prescribed a new medication isosorbide-mononitrate that has been making her feel nauseated and gives her a headache.  EMS arrived noted pt to be pale and diaphoretic sitting BP-100/50 standing BP-90/50 12 lead unremarkable NSR. 445ml bolus of fluid now with a BP of [id number] systolic pt is now pink with no diaphoresis noted.

## 2013-07-13 NOTE — Consult Note (Signed)
CARDIOLOGY CONSULT NOTE  Patient ID: LAWRENCIA DEATS MRN: [id number] DOB/AGE: [birth year] 68 y.o.  Admit date: 07/13/2013 Primary Cardiologist: Aundra Dubin Reason for Consultation: Presyncope  HPI: 68 yo with history of atrial tachycardia, CAD s/p CABG, CKD, bioprosthetic aortic valve presented today with headache, nausea, and presyncope.  In early 1/15, patient was admitted with chest pain at rest, R>L side.  She was ruled out for MI and had Lexiscan Cardiolite showing no ischemia.  She was started on Imdur.  Since starting Imdur, she has had daily headache + nausea.  Today, she was at her grandson's pediatric appointment.  She was standing in the office and developed nausea, dizziness, and diaphoresis.  She was lightheaded and sat down on the floor.  No loss of consciousness.  She came to the ER and was found to be orthostatic (BP 100/50 => 90/50 with standing).  She received a 400 cc bolus and BP rose to 132/57.  She now feels fine.    Patient has not had any chest pain since her admission in 1/15.  She has chronic dyspnea without change.    Review of systems complete and found to be negative unless listed above in HPI  Past Medical History: 1. Diabetes mellitus  2. HTN  3. Obesity  4. Atrial arrhythmias: Paroxysmal atrial fibrillation seems to have been post-operative after CAD-AVR. She has a history of amiodarone toxicity. She has not been on coumadin. 3-week event monitor (9/13) with no atrial fibrillation. 3-week event monitor (10/14) with runs of probable atrial tachycardia.  5. CAD: CABG 1998 with LIMA-LAD/diagonal, sequential SVG-OM1/OM2, SVG-RCA. Redo CABG 2009 with SVG-OM3, SVG-PDA. Lexiscan myoview (9/13): EF 61%, no ischemia or infarction. LHC (10/14) with all grafts patent except the old SVG-RCA. There was 90% stenosis in the AV LCx before ungrafted MOM, but this was unchanged from prior study.  Lexiscan Cardiolite (1/15) with EF 72%, no ischemia.  6. CKD  7. Bioprosthetic aortic  valve: Echo (2011) with EF 50-55%, bioprosthetic aortic valve well-seated. Echo (2/13) with EF 60-65%, bioprosthetic aortic valve looked ok. Echo (9/14): EF 50-55%, mild LVH, bioprosthetic AVR with mean gradient 10, mild MR, moderate TR.  8. Asthma: since her teens.  9. Low back pain.  10. 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 Per neurologist, did not appear cardioembolic. 11. Carotid stenosis: 40-59% bilateral ICA stenosis on 10/14 carotid dopplers.  12. Sleep study 12/14 without significant OSA.    Family History  Problem Relation Age of Onset  . Heart disease Mother   . Heart disease Father   . Heart failure Father     History   Social History  . Marital Status: Married    Spouse Name: N/A    Number of Children: N/A  . Years of Education: N/A   Occupational History  . Not on file.   Social History Main Topics  . Smoking status: Never Smoker   . Smokeless tobacco: Never Used  . Alcohol Use: No  . Drug Use: No  . Sexual Activity: Yes    Birth Control/ Protection: None   Other Topics Concern  . Not on file   Social History Narrative  . No narrative on file     Physical exam Blood pressure 132/51, pulse 73, temperature 97.9 F (36.6 C), temperature source Oral, resp. rate 16, SpO2 100.00%. General: NAD, obese Neck: Thick, JVP 8 cm, no thyromegaly or thyroid nodule.  Lungs: Slight decreased breath sounds at bases CV: Nondisplaced PMI.  Heart  regular S1/S2, no S3/S4, no murmur.  1+ edema 1/4 up lower legs bilaterally.  No carotid bruit.  Normal pedal pulses.  Abdomen: Soft, nontender, no hepatosplenomegaly, no distention.  Skin: Intact without lesions or rashes.  Neurologic: Alert and oriented x 3.  Psych: Normal affect. Extremities: No clubbing or cyanosis.  HEENT: normal  Labs: Pending   EKG: NSR, nonspecific anterior and anterolateral T wave flattening  ASSESSMENT AND PLAN: 68 yo with history of atrial tachycardia, CAD s/p CABG, CKD, bioprosthetic  aortic valve presented today with headache, nausea, and presyncope. 1. Presyncope: Patient was standing in a physician's office.  Episode sounds vasovagal.  She also was recently started on Imdur, which lowers preload.  She has had nausea and headache since Imdur was begun.  I will have her stop Imdur.  If she has further chest pain, ranolazine would be an option.  2. CAD: No chest pain since last admission in 1/15.  Lexiscan Cardiolite at that time showed no ischemia.  As above, I am going to stop Imdur and if she has further chest, would consider ranolazine.  Her chest pain in 1/15 was at rest and may be noncardiac.  3. Atrial arrhythmias: History of atrial tachycardia.  She has been on Multaq long-term, will continue.  She is on Toprol XL.  No palpitations recently.  4. Bioprosthetic aortic valve: Stable on last echo.   Loralie Champagne 07/13/2013 4:45 PM

## 2013-07-13 NOTE — Discharge Instructions (Signed)
Followup with your doctor on Thursday and stop taking the oral nitrates as we discussed

## 2013-07-13 NOTE — ED Provider Notes (Signed)
CSN: [id number]     Arrival date & time 07/13/13  1504 History   First MD Initiated Contact with Patient 07/13/13 1514     Chief Complaint  Patient presents with  . Near Syncope   (Consider location/radiation/quality/duration/timing/severity/associated sxs/prior Treatment) Patient is a 68 y.o. female presenting with near-syncope. The history is provided by the patient.  Near Syncope   patient here after having a near syncopal episode while standing. Denies any shortness of breath or chest pain. Similar episode last week and saw her physician and had MRI which according to patient did not show any acute findings. Symptoms have been persistent since being placed on long acting nitroglycerin medication. Today when she was standing she became diaphoretic and had to sit down. Blood sugar was taken and was above 200. She denies any recent black or bloody stools. No abdominal pain. No fever or chills. Symptoms are better when she lies flat or stands up. She has an appointment to see her cardiologist in 3 days.  Past Medical History  Diagnosis Date  . Hypertension   . Hyperlipidemia   . Atrial fibrillation     in sinus on Multaq  . CHF (congestive heart failure)     EF is low normal at 50 to 55% per echo in Jan. 2011  . Renal insufficiency, mild   . Dyspnea     chronic; followed by Dr. Lamonte Sakai  . Nonischemic cardiomyopathy   . Anemia   . Diabetes mellitus   . Iron deficiency anemia   . High risk medication use   . CAD (coronary artery disease)     Remote CABG in 1998 with redo in 2009  . S/P AVR 2009  . Amiodarone toxicity   . Hearing loss of right ear   . Stroke    Past Surgical History  Procedure Laterality Date  . Coronary artery bypass graft  1998 & 2009    Had LIMA to DX/LAD, SVG to 2 marginal branches and SVG to [facility] originally; SVG to 3rd OM and PD at time of redo  . Aortic valve replacement  2009    #45mm [facility] Ease pericardial valve  . Cardiac catheterization  03/23/2008     Pre-redo CABG: L main OK, LAD (T), CFX (T), OM1 99%, RCA (T), LIMA-LAD OK, SVG-OM(?3) OK w/ little florw to OM2, SVG-RCA OK. EF NL   Family History  Problem Relation Age of Onset  . Heart disease Mother   . Heart disease Father   . Heart failure Father    History  Substance Use Topics  . Smoking status: Never Smoker   . Smokeless tobacco: Never Used  . Alcohol Use: No   OB History   Grav Para Term Preterm Abortions TAB SAB Ect Mult Living                 Review of Systems  Cardiovascular: Positive for near-syncope.  All other systems reviewed and are negative.    Allergies  Amoxicillin and Tetracycline  Home Medications   Current Outpatient Rx  Name  Route  Sig  Dispense  Refill  . albuterol (PROVENTIL) (2.5 MG/3ML) 0.083% nebulizer solution   Nebulization   Take 3 mLs (2.5 mg total) by nebulization every 6 (six) hours as needed for wheezing or shortness of breath.   180 mL   0   . albuterol (PROVENTIL,VENTOLIN) 90 MCG/ACT inhaler   Inhalation   Inhale 2 puffs into the lungs every 6 (six) hours as needed. For  shortness of breath         . ALPRAZolam (XANAX) 0.25 MG tablet   Oral   Take 1 tablet (0.25 mg total) by mouth at bedtime as needed for sleep.   30 tablet   0     Dr Aundra Dubin will not be able to provide additional r ...   . amLODipine (NORVASC) 10 MG tablet   Oral   Take 10 mg by mouth daily.         Marland Kitchen amLODipine (NORVASC) 10 MG tablet      TAKE ONE TABLET BY MOUTH ONCE DAILY   30 tablet   0   . aspirin EC 81 MG EC tablet   Oral   Take 1 tablet (81 mg total) by mouth daily.         . budesonide-formoterol (SYMBICORT) 160-4.5 MCG/ACT inhaler   Inhalation   Inhale 2 puffs into the lungs 2 (two) times daily.   1 Inhaler   6   . Calcium Carbonate-Vit D-Min (CALTRATE PLUS PO)   Oral   Take 1 tablet by mouth daily.         . citalopram (CELEXA) 20 MG tablet   Oral   Take 20 mg by mouth daily.         . clopidogrel (PLAVIX) 75  MG tablet   Oral   Take 75 mg by mouth daily.         Marland Kitchen dronedarone (MULTAQ) 400 MG tablet   Oral   Take 400 mg by mouth 2 (two) times daily with a meal.         . dronedarone (MULTAQ) 400 MG tablet   Oral   Take 1 tablet (400 mg total) by mouth 2 (two) times daily with a meal.   60 tablet   5   . insulin glargine (LANTUS) 100 UNIT/ML injection   Subcutaneous   Inject 50 Units into the skin daily.          . insulin lispro (HUMALOG) 100 UNIT/ML injection   Subcutaneous   Inject 10-20 Units into the skin See admin instructions. Per sliding scale 10-20 unit range 3 times daily         . isosorbide mononitrate (IMDUR) 30 MG 24 hr tablet   Oral   Take 1 tablet (30 mg total) by mouth daily.   30 tablet   5   . loratadine (CLARITIN) 10 MG tablet   Oral   Take 1 tablet (10 mg total) by mouth daily.   30 tablet   6   . metoprolol succinate (TOPROL XL) 25 MG 24 hr tablet   Oral   Take 1 tablet (25 mg total) by mouth daily.   30 tablet   6   . omeprazole (PRILOSEC) 40 MG capsule   Oral   Take 40 mg by mouth 2 (two) times daily.         . rosuvastatin (CRESTOR) 40 MG tablet   Oral   Take 1 tablet (40 mg total) by mouth daily.   30 tablet   11   . traMADol (ULTRAM) 50 MG tablet   Oral   Take 50 mg by mouth every 8 (eight) hours as needed.           BP 132/51  Pulse 73  Temp(Src) 97.9 F (36.6 C) (Oral)  Resp 16  SpO2 100% Physical Exam  Nursing note and vitals reviewed. Constitutional: She is oriented to person, place, and time. She appears  well-developed and well-nourished.  Non-toxic appearance. No distress.  HENT:  Head: Normocephalic and atraumatic.  Eyes: Conjunctivae, EOM and lids are normal. Pupils are equal, round, and reactive to light.  Neck: Normal range of motion. Neck supple. No tracheal deviation present. No mass present.  Cardiovascular: Normal rate, regular rhythm and normal heart sounds.  Exam reveals no gallop.   No murmur  heard. Pulmonary/Chest: Effort normal and breath sounds normal. No stridor. No respiratory distress. She has no decreased breath sounds. She has no wheezes. She has no rhonchi. She has no rales.  Abdominal: Soft. Normal appearance and bowel sounds are normal. She exhibits no distension. There is no tenderness. There is no rebound and no CVA tenderness.  Musculoskeletal: Normal range of motion. She exhibits no edema and no tenderness.  Neurological: She is alert and oriented to person, place, and time. She has normal strength. No cranial nerve deficit or sensory deficit. GCS eye subscore is 4. GCS verbal subscore is 5. GCS motor subscore is 6.  Skin: Skin is warm and dry. No abrasion and no rash noted.  Psychiatric: She has a normal mood and affect. Her speech is normal and behavior is normal.    ED Course  Procedures (including critical care time) Labs Review Labs Reviewed  CBC WITH DIFFERENTIAL  COMPREHENSIVE METABOLIC PANEL  TROPONIN I   Imaging Review No results found.  EKG Interpretation    Date/Time:  Monday July 13 2013 15:14:06 EST Ventricular Rate:  74 PR Interval:  190 QRS Duration: 93 QT Interval:  412 QTC Calculation: 457 R Axis:   16 Text Interpretation:  Sinus rhythm Nonspecific T abnormalities, lateral leads No significant change since last tracing Confirmed by Lorenda Grecco  MD, Ayden Hardwick ([id number]) on 07/13/2013 3:46:08 PM            MDM  No diagnosis found. Patient seen by cardiology and they have recommended stopping the patient's oral nitrates and will see her for followup later this week    Leota Jacobsen, MD 07/13/13 [phone]

## 2013-07-16 ENCOUNTER — Other Ambulatory Visit: Payer: Self-pay | Admitting: Cardiology

## 2013-07-16 ENCOUNTER — Encounter: Payer: Self-pay | Admitting: Cardiology

## 2013-07-16 ENCOUNTER — Ambulatory Visit (INDEPENDENT_AMBULATORY_CARE_PROVIDER_SITE_OTHER): Payer: Medicare Other | Admitting: Cardiology

## 2013-07-16 VITALS — BP 138/60 | HR 90 | Ht 65.0 in | Wt 219.8 lb

## 2013-07-16 DIAGNOSIS — I2510 Atherosclerotic heart disease of native coronary artery without angina pectoris: Secondary | ICD-10-CM

## 2013-07-16 DIAGNOSIS — I10 Essential (primary) hypertension: Secondary | ICD-10-CM

## 2013-07-16 DIAGNOSIS — I5043 Acute on chronic combined systolic (congestive) and diastolic (congestive) heart failure: Secondary | ICD-10-CM

## 2013-07-16 DIAGNOSIS — I509 Heart failure, unspecified: Secondary | ICD-10-CM

## 2013-07-16 DIAGNOSIS — I498 Other specified cardiac arrhythmias: Secondary | ICD-10-CM

## 2013-07-16 DIAGNOSIS — E785 Hyperlipidemia, unspecified: Secondary | ICD-10-CM

## 2013-07-16 DIAGNOSIS — I471 Supraventricular tachycardia: Secondary | ICD-10-CM

## 2013-07-16 DIAGNOSIS — I4891 Unspecified atrial fibrillation: Secondary | ICD-10-CM

## 2013-07-16 MED ORDER — POTASSIUM CHLORIDE ER 10 MEQ PO TBCR: 10.0000 meq | EXTENDED_RELEASE_TABLET | Freq: Every day | ORAL | Status: DC

## 2013-07-16 MED ORDER — METOPROLOL SUCCINATE ER 50 MG PO TB24: 50.0000 mg | ORAL_TABLET | Freq: Every day | ORAL | Status: DC

## 2013-07-16 MED ORDER — FUROSEMIDE 20 MG PO TABS: 20.0000 mg | ORAL_TABLET | Freq: Every day | ORAL | Status: DC

## 2013-07-16 NOTE — Patient Instructions (Signed)
Increase Toprol XL (metoprolol succinate) to 50mg  daily. You can take 2 of your 25mg  tablets daily at the same time and use your current supply.  Start lasix (furosemide) 20mg  daily.   Start KCL(potassium) 10 mEq daily.   Your physician recommends that you return for lab work in: about 10 days--BMET/BNP.  Your physician recommends that you schedule a follow-up appointment in: 2 months with Dr Aundra Dubin.

## 2013-07-17 DIAGNOSIS — I5043 Acute on chronic combined systolic (congestive) and diastolic (congestive) heart failure: Secondary | ICD-10-CM | POA: Insufficient documentation

## 2013-07-17 NOTE — Progress Notes (Signed)
Patient ID: Hannah [name], female   DOB: 11/01/1945, 68 y.o.   MRN: [id number] PCP: Dr. Suzy Bouchard  69 yo with history of CAD s/p CABG, bioprosthetic AVR, CKD, paroxysmal atrial fibrillation presents for cardiology followup.  She had her initial CABG in 1998, then had redo CABG in 2009 with bioprosthetic AVR.  She does not remember when she last had documented atrial fibrillation, but she thinks it was a number of years ago, sounds peri-operative around the time of her redo CABG.  She was never put on anticoagulation but has been on dronedarone.    Over the summer, she had frequent episodes of tachypalpitations.  In 9/14, she developed tachypalpitations and later chest pain.  In the ER, SBP was in the 200s with elevated HR. Troponin was mildly elevated.  Therefore, she had a cardiac cath showing patent grafts.  She was thought to have had demand ischemia in the setting of hypertensive emergency + tachycardia.  After discharge, she continued to have runs of tachypalpitations.  A 3 week event monitor was put on her.  This showed runs, sometimes long, of atrial tachycardia.  She was started on Toprol XL when these runs were noted.  No further palpitations (complete resolution of symptoms).    In 1/15, she was admitted to [facility] with atypical chest pain.  She was ruled out for MI and Lexiscan Cardiolite showed no ischemia.  She was started on Imdur and sent home.  After discharge, she developed a daily headache and nausea.  She did not eat or drink much due to the nausea.  Last week, she was standing in her grandson's pediatrician's office and became severely lightheaded.  She had to sit down.  She came to the ER and was found to be orthostatic.  She was given IV fluid and got better quickly.  I had her stop Imdur and symptoms seem to have completely resolved.  Currently, her main problem is exertional dyspnea.  She is short of breath walking up 1 flight of steps or walking about 100 yards.  She has ankle edema.     ECG: NSR, nonspecific T wave flattening  Labs (2/13): BNP 80, K 5.3, creatinine 1.1 Labs (6/13): LDL 76, HDL 41 Labs (8/13): K 4.3, creatinine 1.38 Labs (9/13): creatinine 1.6, proBNP 128 Labs (9/14): K 4.7, creatinine 1.5, LDL 82, HDL 35 Labs (10/14): K 4.8, creatinine 1.5 Labs (2/15): K 4.1, creatinine 1.28, HCT 34  PMH: 1. Diabetes mellitus 2. HTN 3. Obesity 4. Atrial arrhythmias: Paroxysmal atrial fibrillation seems to have been post-operative after CAD-AVR. She has a history of amiodarone toxicity. She has not been on coumadin.  3-week event monitor (9/13) with no atrial fibrillation.  3-week event monitor (10/14) with runs of probable atrial tachycardia.  5. CAD: CABG 1998 with LIMA-LAD/diagonal, sequential SVG-OM1/OM2, SVG-RCA.  Redo CABG 2009 with SVG-OM3, SVG-PDA.  Lexiscan myoview (9/13): EF 61%, no ischemia or infarction.  LHC (10/14) with all grafts patent except the old SVG-RCA.  There was 90% stenosis in the AV LCx before ungrafted MOM, but this was unchanged from prior study.  Lexiscan Cardiolite (1/15) with no ischemia. She does not tolerate Imdur.  6. CKD 7. Bioprosthetic aortic valve: Echo (2011) with EF 50-55%, bioprosthetic aortic valve well-seated.  Echo (2/13) with EF 60-65%, bioprosthetic aortic valve looked ok.  Echo (9/14): EF 50-55%, mild LVH, bioprosthetic AVR with mean gradient 10, mild MR, moderate TR.  8. Asthma: since her teens. 9. Low back pain.  10. 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  Per neurologist, did not appear cardioembolic.  11. Carotid stenosis: 40-59% bilateral ICA stenosis on 10/14 carotid dopplers.  12. Sleep study 12/14 without significant OSA.  13. Chronic diastolic CHF  SH: Never smoked.  Lives in [place].  Married.    FH: No premature CAD.   ROS: All systems reviewed and negative except as per HPI.    Current Outpatient Prescriptions  Medication Sig Dispense Refill  . albuterol (PROVENTIL) (2.5 MG/3ML) 0.083% nebulizer  solution Take 3 mLs (2.5 mg total) by nebulization every 6 (six) hours as needed for wheezing or shortness of breath.  180 mL  0  . albuterol (PROVENTIL,VENTOLIN) 90 MCG/ACT inhaler Inhale 2 puffs into the lungs every 6 (six) hours as needed. For shortness of breath      . ALPRAZolam (XANAX) 0.25 MG tablet Take 1 tablet (0.25 mg total) by mouth at bedtime as needed for sleep.  30 tablet  0  . amLODipine (NORVASC) 10 MG tablet Take 10 mg by mouth daily.      Marland Kitchen aspirin EC 81 MG EC tablet Take 1 tablet (81 mg total) by mouth daily.      . budesonide-formoterol (SYMBICORT) 160-4.5 MCG/ACT inhaler Inhale 2 puffs into the lungs 2 (two) times daily.  1 Inhaler  6  . citalopram (CELEXA) 20 MG tablet Take 20 mg by mouth daily.      . clopidogrel (PLAVIX) 75 MG tablet Take 75 mg by mouth daily.      Marland Kitchen dronedarone (MULTAQ) 400 MG tablet Take 400 mg by mouth 2 (two) times daily with a meal.      . insulin glargine (LANTUS) 100 UNIT/ML injection Inject 50 Units into the skin every morning.       . insulin lispro (HUMALOG) 100 UNIT/ML injection Inject 10-20 Units into the skin See admin instructions. Per sliding scale 10-20 unit range 3 times daily      . loratadine (CLARITIN) 10 MG tablet Take 1 tablet (10 mg total) by mouth daily.  30 tablet  6  . omeprazole (PRILOSEC) 40 MG capsule Take 40 mg by mouth 2 (two) times daily.      . rosuvastatin (CRESTOR) 40 MG tablet Take 1 tablet (40 mg total) by mouth daily.  30 tablet  11  . traMADol (ULTRAM) 50 MG tablet Take 50 mg by mouth every 8 (eight) hours as needed for moderate pain.       Marland Kitchen amLODipine (NORVASC) 10 MG tablet TAKE ONE TABLET BY MOUTH ONCE DAILY  30 tablet  0  . furosemide (LASIX) 20 MG tablet Take 1 tablet (20 mg total) by mouth daily.  30 tablet  3  . metoprolol succinate (TOPROL-XL) 50 MG 24 hr tablet Take 1 tablet (50 mg total) by mouth daily. Take with or immediately following a meal.  30 tablet  3  . potassium chloride (KLOR-CON 10) 10 MEQ tablet  Take 1 tablet (10 mEq total) by mouth daily.  30 tablet  3   No current facility-administered medications for this visit.    BP 138/60  Pulse 90  Ht 5\' 5"  (1.651 m)  Wt 99.701 kg (219 lb 12.8 oz)  BMI 36.58 kg/m2 General: NAD Neck: Thick neck, JVP 8-9 cm, no thyromegaly or thyroid nodule.  Lungs: Clear to auscultation bilaterally with normal respiratory effort. CV: Nondisplaced PMI.  Heart regular S1/S2, no S3/S4, 1/6 SEM RUSB.  1+ edema 1/3 up lower legs bilaterally.  Bilateral carotid  bruits.  Normal pedal pulses.  Abdomen: Soft, nontender, no hepatosplenomegaly, no distention.  Neurologic: Alert and oriented x 3.  Psych: Normal affect. Extremities: No clubbing or cyanosis.   Assessment/Plan: 1. CAD  Admission in 9/14 with mild troponin elevation.  LHC showed stable anatomy with patent grafts except for SVG-RCA from older CABG.  Suspect that the troponin elevation was due to demand ischemia in the setting of hypertensive emergency and tachycardia (possible run of atrial tachycardia).  Admission in 1/15 with atypical chest pain, had Lexiscan Cardiolite that showed no ischemia.  No chest pain since 1/15.  She was started on Imdur in 1/15 but did not tolerate it.  - Continue ASA 81, Plavix, statin   - I do not think that her chest pain in 1/15 was likely to be ischemic (atypical, negative Cardiolite).  However, she is concerned about making sure that she has an adequate anti-anginal regimen now that she cannot take Imdur.  I will increase her Toprol XL to 50 mg daily.   2. Atrial Arrhythmias Patient has not been on anticoagulation. She apparently had atrial fibrillation at some point in the past. It seems that the atrial fibrillation may have been post-op after redo CABG and has not recurred.  She has been on dronedarone. She is in NSR today. She had palpitations this fall.  Event monitor suggested that these symptoms may be coming from atrial tachycardia.  Symptoms seem to have been  suppressed completely by Toprol XL.  I will have her continue Toprol XL (with increase as above) and dronedarone and will follow symptomatically.  3. HYPERLIPIDEMIA  Acceptable lipids 9/14, continue statin. .  4. Bioprosthetic aortic valve Well-seated on last echo.  5. Carotid bruit Repeat carotid dopplers in 10/15.   6. Presyncope I think this was related to nausea/headache from Imdur.  The event itself may have been vagally-mediated.   7. Chronic diastolic CHF Patient is volume overloaded on exam with NYHA class II-III symptoms. I am going to have her start Lasix 20 mg daily with KCl 10 mEq daily.  Will get BMET/BNP in 10 days and followup in 2 months.   Loralie Champagne 07/17/2013

## 2013-07-24 ENCOUNTER — Other Ambulatory Visit: Payer: Self-pay | Admitting: Emergency Medicine

## 2013-07-30 ENCOUNTER — Other Ambulatory Visit (INDEPENDENT_AMBULATORY_CARE_PROVIDER_SITE_OTHER): Payer: Medicare Other

## 2013-07-30 DIAGNOSIS — I5043 Acute on chronic combined systolic (congestive) and diastolic (congestive) heart failure: Secondary | ICD-10-CM

## 2013-07-30 DIAGNOSIS — I471 Supraventricular tachycardia: Secondary | ICD-10-CM

## 2013-07-30 DIAGNOSIS — I2510 Atherosclerotic heart disease of native coronary artery without angina pectoris: Secondary | ICD-10-CM

## 2013-07-30 DIAGNOSIS — I509 Heart failure, unspecified: Secondary | ICD-10-CM

## 2013-07-30 DIAGNOSIS — I498 Other specified cardiac arrhythmias: Secondary | ICD-10-CM

## 2013-07-30 DIAGNOSIS — E785 Hyperlipidemia, unspecified: Secondary | ICD-10-CM

## 2013-07-30 DIAGNOSIS — I4891 Unspecified atrial fibrillation: Secondary | ICD-10-CM

## 2013-07-30 DIAGNOSIS — I10 Essential (primary) hypertension: Secondary | ICD-10-CM

## 2013-07-30 DIAGNOSIS — R0602 Shortness of breath: Secondary | ICD-10-CM

## 2013-07-30 LAB — BASIC METABOLIC PANEL
BUN: 29 mg/dL — ABNORMAL HIGH (ref 6–23)
CO2: 28 mEq/L (ref 19–32)
Calcium: 9.4 mg/dL (ref 8.4–10.5)
Chloride: 101 mEq/L (ref 96–112)
Creatinine, Ser: 1.7 mg/dL — ABNORMAL HIGH (ref 0.4–1.2)
GFR: 31.35 mL/min — ABNORMAL LOW (ref >60.00)
Glucose, Bld: 229 mg/dL — ABNORMAL HIGH (ref 70–99)
Potassium: 4.7 mEq/L (ref 3.5–5.1)
Sodium: 138 mEq/L (ref 135–145)

## 2013-07-30 LAB — BRAIN NATRIURETIC PEPTIDE: Pro B Natriuretic peptide (BNP): 119 pg/mL — ABNORMAL HIGH (ref 0.0–100.0)

## 2013-07-31 ENCOUNTER — Telehealth: Payer: Self-pay | Admitting: *Deleted

## 2013-07-31 DIAGNOSIS — I2510 Atherosclerotic heart disease of native coronary artery without angina pectoris: Secondary | ICD-10-CM

## 2013-07-31 NOTE — Telephone Encounter (Signed)
Message copied by Verna Czech on Fri Jul 31, 2013  2:47 PM ------      Message from: Larey Dresser      Created: Fri Jul 31, 2013 10:14 AM       Creatinine higher but not much off her baseline.  BNP better.  Continue current meds for now, repeat BMET in 1 month. ------

## 2013-07-31 NOTE — Telephone Encounter (Signed)
Pt is aware of lab results. She will come in on 08/26/13 for repeat BMP  Horton Chin RN

## 2013-08-17 ENCOUNTER — Other Ambulatory Visit: Payer: Self-pay | Admitting: Cardiology

## 2013-08-17 ENCOUNTER — Telehealth: Payer: Self-pay | Admitting: Cardiology

## 2013-08-17 NOTE — Telephone Encounter (Signed)
Pt advised.

## 2013-08-17 NOTE — Telephone Encounter (Signed)
New Problem:  Pt is c/o BP concerns... Pt sates her BP was 60/40 Saturday night around 7:30pm. Pt states her BP was 130/68 this morning. PT is requesting she be worked in for a sooner appt. Pt is scheduled on 4/15 to see Dr. Aundra Dubin. PT would like to speak to [facility].

## 2013-08-17 NOTE — Telephone Encounter (Signed)
Sounds like this was a vasovagal event again (think she has had in the past).  She did the right thing by drinking more fluid.

## 2013-08-17 NOTE — Telephone Encounter (Signed)
Pt states Saturday about 7 PM pt got very nauseated, weak, sweating and felt like she was going to pass out. Pt had eaten about 30 minutes before this.  She checked BS and it was 200, she took insulin and got her BS back down. Pt states she checked her BP and  it was 60/40. Pt felt like she might be dehydrated so she drank a lot of water. Her BP about 45 minutes later  was 110/50s. The next morning (Sunday)  her BP was 130/68. Pt states she made no changes and she feels better today. She is asking if Dr Aundra Dubin has any new recommendations based on this information. I will forward to Dr Aundra Dubin for review.

## 2013-08-26 ENCOUNTER — Other Ambulatory Visit: Payer: Self-pay | Admitting: Emergency Medicine

## 2013-08-26 ENCOUNTER — Ambulatory Visit (INDEPENDENT_AMBULATORY_CARE_PROVIDER_SITE_OTHER): Payer: Medicare Other | Admitting: *Deleted

## 2013-08-26 DIAGNOSIS — I10 Essential (primary) hypertension: Secondary | ICD-10-CM

## 2013-08-26 LAB — BASIC METABOLIC PANEL
BUN: 32 mg/dL — ABNORMAL HIGH (ref 6–23)
CO2: 28 mEq/L (ref 19–32)
Calcium: 9.1 mg/dL (ref 8.4–10.5)
Chloride: 102 mEq/L (ref 96–112)
Creatinine, Ser: 1.7 mg/dL — ABNORMAL HIGH (ref 0.4–1.2)
GFR: 32.88 mL/min — ABNORMAL LOW (ref >60.00)
Glucose, Bld: 232 mg/dL — ABNORMAL HIGH (ref 70–99)
Potassium: 4.3 mEq/L (ref 3.5–5.1)
Sodium: 137 mEq/L (ref 135–145)

## 2013-08-27 ENCOUNTER — Other Ambulatory Visit: Payer: Self-pay | Admitting: *Deleted

## 2013-08-27 DIAGNOSIS — I471 Supraventricular tachycardia: Secondary | ICD-10-CM

## 2013-08-27 DIAGNOSIS — I5043 Acute on chronic combined systolic (congestive) and diastolic (congestive) heart failure: Secondary | ICD-10-CM

## 2013-08-27 DIAGNOSIS — E785 Hyperlipidemia, unspecified: Secondary | ICD-10-CM

## 2013-08-27 DIAGNOSIS — I4891 Unspecified atrial fibrillation: Secondary | ICD-10-CM

## 2013-08-27 DIAGNOSIS — I2510 Atherosclerotic heart disease of native coronary artery without angina pectoris: Secondary | ICD-10-CM

## 2013-08-27 DIAGNOSIS — I10 Essential (primary) hypertension: Secondary | ICD-10-CM

## 2013-08-31 ENCOUNTER — Ambulatory Visit: Payer: Medicare Other | Admitting: Nurse Practitioner

## 2013-09-02 ENCOUNTER — Other Ambulatory Visit: Payer: Self-pay | Admitting: Neurosurgery

## 2013-09-02 DIAGNOSIS — M48061 Spinal stenosis, lumbar region without neurogenic claudication: Secondary | ICD-10-CM

## 2013-09-06 ENCOUNTER — Ambulatory Visit
Admission: RE | Admit: 2013-09-06 | Discharge: 2013-09-06 | Disposition: A | Discharge status: Home / Self Care | Payer: Medicare Other | Source: Ambulatory Visit | Attending: Neurosurgery | Admitting: Neurosurgery

## 2013-09-06 DIAGNOSIS — M48061 Spinal stenosis, lumbar region without neurogenic claudication: Secondary | ICD-10-CM

## 2013-09-10 ENCOUNTER — Other Ambulatory Visit (INDEPENDENT_AMBULATORY_CARE_PROVIDER_SITE_OTHER): Payer: Medicare Other

## 2013-09-10 DIAGNOSIS — I10 Essential (primary) hypertension: Secondary | ICD-10-CM

## 2013-09-10 DIAGNOSIS — I4891 Unspecified atrial fibrillation: Secondary | ICD-10-CM

## 2013-09-10 DIAGNOSIS — I5043 Acute on chronic combined systolic (congestive) and diastolic (congestive) heart failure: Secondary | ICD-10-CM

## 2013-09-10 DIAGNOSIS — I471 Supraventricular tachycardia: Secondary | ICD-10-CM

## 2013-09-10 DIAGNOSIS — I4719 Other supraventricular tachycardia: Secondary | ICD-10-CM

## 2013-09-10 DIAGNOSIS — I498 Other specified cardiac arrhythmias: Secondary | ICD-10-CM

## 2013-09-10 DIAGNOSIS — E785 Hyperlipidemia, unspecified: Secondary | ICD-10-CM

## 2013-09-10 DIAGNOSIS — I509 Heart failure, unspecified: Secondary | ICD-10-CM

## 2013-09-10 DIAGNOSIS — I2510 Atherosclerotic heart disease of native coronary artery without angina pectoris: Secondary | ICD-10-CM

## 2013-09-10 LAB — BASIC METABOLIC PANEL
BUN: 26 mg/dL — ABNORMAL HIGH (ref 6–23)
CO2: 28 mEq/L (ref 19–32)
Calcium: 9.2 mg/dL (ref 8.4–10.5)
Chloride: 100 mEq/L (ref 96–112)
Creatinine, Ser: 1.5 mg/dL — ABNORMAL HIGH (ref 0.4–1.2)
GFR: 36.42 mL/min — ABNORMAL LOW (ref >60.00)
Glucose, Bld: 177 mg/dL — ABNORMAL HIGH (ref 70–99)
Potassium: 4.2 mEq/L (ref 3.5–5.1)
Sodium: 137 mEq/L (ref 135–145)

## 2013-09-23 ENCOUNTER — Ambulatory Visit (INDEPENDENT_AMBULATORY_CARE_PROVIDER_SITE_OTHER): Payer: Medicare Other | Admitting: Cardiology

## 2013-09-23 ENCOUNTER — Encounter: Payer: Self-pay | Admitting: Cardiology

## 2013-09-23 VITALS — BP 140/53 | HR 75 | Ht 65.0 in | Wt 224.0 lb

## 2013-09-23 DIAGNOSIS — Z954 Presence of other heart-valve replacement: Secondary | ICD-10-CM

## 2013-09-23 DIAGNOSIS — I4891 Unspecified atrial fibrillation: Secondary | ICD-10-CM

## 2013-09-23 DIAGNOSIS — I471 Supraventricular tachycardia: Secondary | ICD-10-CM

## 2013-09-23 DIAGNOSIS — I509 Heart failure, unspecified: Secondary | ICD-10-CM

## 2013-09-23 DIAGNOSIS — E785 Hyperlipidemia, unspecified: Secondary | ICD-10-CM

## 2013-09-23 DIAGNOSIS — Z952 Presence of prosthetic heart valve: Secondary | ICD-10-CM

## 2013-09-23 DIAGNOSIS — I2510 Atherosclerotic heart disease of native coronary artery without angina pectoris: Secondary | ICD-10-CM

## 2013-09-23 DIAGNOSIS — I5032 Chronic diastolic (congestive) heart failure: Secondary | ICD-10-CM

## 2013-09-23 DIAGNOSIS — N189 Chronic kidney disease, unspecified: Secondary | ICD-10-CM

## 2013-09-23 DIAGNOSIS — I498 Other specified cardiac arrhythmias: Secondary | ICD-10-CM

## 2013-09-23 MED ORDER — FUROSEMIDE 40 MG PO TABS: 40.0000 mg | ORAL_TABLET | Freq: Every day | ORAL | Status: DC

## 2013-09-23 MED ORDER — METOPROLOL SUCCINATE ER 50 MG PO TB24
ORAL_TABLET | ORAL | Status: DC
Start: 2013-09-23 — End: 2014-02-03

## 2013-09-23 MED ORDER — POTASSIUM CHLORIDE ER 10 MEQ PO TBCR: 10.0000 meq | EXTENDED_RELEASE_TABLET | Freq: Every day | ORAL | Status: DC

## 2013-09-23 NOTE — Patient Instructions (Signed)
Stop Multaq.  Increase Toprol Xl (metoprolol succinate) to 75mg  daily. This will be 1 and 1/2 of a 50mg  tablet daily.  Increase lasix(furosmide) to 40mg  daily.   Increase KCL(potassium) to 10 mEq daily.   Your physician recommends that you return for lab work in: 10 days--BMET/BNP.  Your physician recommends that you schedule a follow-up appointment in: 4 weeks with Dr Aundra Dubin.

## 2013-09-24 DIAGNOSIS — I5032 Chronic diastolic (congestive) heart failure: Secondary | ICD-10-CM

## 2013-09-24 DIAGNOSIS — N184 Chronic kidney disease, stage 4 (severe): Secondary | ICD-10-CM

## 2013-09-24 HISTORY — DX: Chronic kidney disease, stage 4 (severe): N18.4

## 2013-09-24 HISTORY — DX: Chronic diastolic (congestive) heart failure: I50.32

## 2013-09-24 NOTE — Progress Notes (Signed)
Patient ID: Hannah [name], female   DOB: Jun 27, 1945, 68 y.o.   MRN: [id number] PCP: Dr. Suzy Bouchard  68 yo with history of CAD s/p CABG, bioprosthetic AVR, CKD, paroxysmal atrial fibrillation presents for cardiology followup.  She had her initial CABG in 1998, then had redo CABG in 2009 with bioprosthetic AVR.  She does not remember when she last had documented atrial fibrillation, but she thinks it was a number of years ago, sounds peri-operative around the time of her redo CABG.  She was never put on anticoagulation but has been on dronedarone.    Over the summer, she had frequent episodes of tachypalpitations.  In 9/14, she developed tachypalpitations and later chest pain.  In the ER, SBP was in the 200s with elevated HR. Troponin was mildly elevated.  Therefore, she had a cardiac cath showing patent grafts.  She was thought to have had demand ischemia in the setting of hypertensive emergency + tachycardia.  After discharge, she continued to have runs of tachypalpitations.  A 3 week event monitor was put on her.  This showed runs, sometimes long, of atrial tachycardia.  She was started on Toprol XL when these runs were noted.  No further palpitations (complete resolution of symptoms).    In 1/15, she was admitted to [facility] with atypical chest pain.  She was ruled out for MI and Lexiscan Cardiolite showed no ischemia.  She was started on Imdur and sent home.  After discharge, she developed a daily headache and nausea.  She did not eat or drink much due to the nausea. Shortly after this, she was standing in her grandson's pediatrician's office and became severely lightheaded.  She had to sit down.  She came to the ER and was found to be orthostatic.  She was given IV fluid and got better quickly.  I had her stop Imdur and symptoms seem to have completely resolved.    Currently, her main problem is exertional dyspnea and peripheral edema.  She is short of breath walking up 1 flight of steps or walking about  100 yards.  Lower extremity edema has worsened.  She is currently on a low dose of Lasix (20 mg qod).  Very rare, mild palpitations.  No long runs. No chest pain. Weight is up 5 lbs.      ECG: NSR, nonspecific T wave flattening  Labs (2/13): BNP 80, K 5.3, creatinine 1.1 Labs (6/13): LDL 76, HDL 41 Labs (8/13): K 4.3, creatinine 1.38 Labs (9/13): creatinine 1.6, proBNP 128 Labs (9/14): K 4.7, creatinine 1.5, LDL 82, HDL 35 Labs (10/14): K 4.8, creatinine 1.5 Labs (2/15): K 4.1, creatinine 1.28, HCT 34 Labs (4/15): K 4.2, creatinine 1.5  PMH: 1. Diabetes mellitus 2. HTN 3. Obesity 4. Atrial arrhythmias: Paroxysmal atrial fibrillation seems to have been post-operative after CAD-AVR. She has a history of amiodarone toxicity. She has not been on coumadin.  3-week event monitor (9/13) with no atrial fibrillation.  3-week event monitor (10/14) with runs of probable atrial tachycardia.  5. CAD: CABG 1998 with LIMA-LAD/diagonal, sequential SVG-OM1/OM2, SVG-RCA.  Redo CABG 2009 with SVG-OM3, SVG-PDA.  Lexiscan myoview (9/13): EF 61%, no ischemia or infarction.  LHC (10/14) with all grafts patent except the old SVG-RCA.  There was 90% stenosis in the AV LCx before ungrafted MOM, but this was unchanged from prior study.  Lexiscan Cardiolite (1/15) with no ischemia. She does not tolerate Imdur.  6. CKD 7. Bioprosthetic aortic valve: Echo (2011) with EF 50-55%, bioprosthetic aortic  valve well-seated.  Echo (2/13) with EF 60-65%, bioprosthetic aortic valve looked ok.  Echo (9/14): EF 50-55%, mild LVH, bioprosthetic AVR with mean gradient 10, mild MR, moderate TR.  8. Asthma: since her teens. 9. Low back pain.  10. 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  Per neurologist, did not appear cardioembolic.  11. Carotid stenosis: 40-59% bilateral ICA stenosis on 10/14 carotid dopplers.  12. Sleep study 12/14 without significant OSA.  13. Chronic diastolic CHF  SH: Never smoked.  Lives in [place].   Married.    FH: No premature CAD.   ROS: All systems reviewed and negative except as per HPI.    Current Outpatient Prescriptions  Medication Sig Dispense Refill  . albuterol (PROVENTIL) (2.5 MG/3ML) 0.083% nebulizer solution Take 3 mLs (2.5 mg total) by nebulization every 6 (six) hours as needed for wheezing or shortness of breath.  180 mL  0  . albuterol (PROVENTIL,VENTOLIN) 90 MCG/ACT inhaler Inhale 2 puffs into the lungs every 6 (six) hours as needed. For shortness of breath      . ALPRAZolam (XANAX) 0.25 MG tablet Take 1 tablet (0.25 mg total) by mouth at bedtime as needed for sleep.  30 tablet  0  . amLODipine (NORVASC) 10 MG tablet TAKE ONE TABLET BY MOUTH ONCE DAILY  30 tablet  1  . aspirin EC 81 MG EC tablet Take 1 tablet (81 mg total) by mouth daily.      . budesonide-formoterol (SYMBICORT) 160-4.5 MCG/ACT inhaler Inhale 2 puffs into the lungs 2 (two) times daily.  1 Inhaler  6  . citalopram (CELEXA) 20 MG tablet Take 20 mg by mouth daily.      . clopidogrel (PLAVIX) 75 MG tablet Take 75 mg by mouth daily.      Noelle Penner ALLERGY RELIEF 10 MG tablet TAKE ONE TABLET BY MOUTH EVERY DAY AS NEEDED FOR ALLERGY  30 tablet  0  . insulin glargine (LANTUS) 100 UNIT/ML injection Inject 50 Units into the skin every morning.       . insulin lispro (HUMALOG) 100 UNIT/ML injection Inject 10-20 Units into the skin See admin instructions. Per sliding scale 10-20 unit range 3 times daily      . omeprazole (PRILOSEC) 40 MG capsule Take 40 mg by mouth 2 (two) times daily.      . rosuvastatin (CRESTOR) 40 MG tablet Take 1 tablet (40 mg total) by mouth daily.  30 tablet  11  . traMADol (ULTRAM) 50 MG tablet Take 50 mg by mouth every 8 (eight) hours as needed for moderate pain.       . VENTOLIN HFA 108 (90 BASE) MCG/ACT inhaler INHALE TWO PUFFS EVERY 4 HOURS AS NEEDED  18 each  0  . furosemide (LASIX) 40 MG tablet Take 1 tablet (40 mg total) by mouth daily.  30 tablet  3  . metoprolol succinate (TOPROL-XL) 50  MG 24 hr tablet 1.5 tablets (75mg ) daily. Take with or immediately following a meal.  45 tablet  3  . potassium chloride (K-DUR) 10 MEQ tablet Take 1 tablet (10 mEq total) by mouth daily.  30 tablet  3   No current facility-administered medications for this visit.    BP 140/53  Pulse 75  Ht 5\' 5"  (1.651 m)  Wt 101.606 kg (224 lb)  BMI 37.28 kg/m2 General: NAD Neck: Thick neck, JVP 9-10 cm, no thyromegaly or thyroid nodule.  Lungs: Clear to auscultation bilaterally with normal respiratory effort. CV: Nondisplaced  PMI.  Heart regular S1/S2, no S3/S4, 2/6 SEM RUSB.  1+ edema to knees bilaterally.  Bilateral carotid bruits.  Normal pedal pulses.  Abdomen: Soft, nontender, no hepatosplenomegaly, no distention.  Neurologic: Alert and oriented x 3.  Psych: Normal affect. Extremities: No clubbing or cyanosis.   Assessment/Plan: 1. CAD  Admission in 9/14 with mild troponin elevation.  LHC showed stable anatomy with patent grafts except for SVG-RCA from older CABG.  Suspect that the troponin elevation was due to demand ischemia in the setting of hypertensive emergency and tachycardia (possible run of atrial tachycardia).  Admission in 1/15 with atypical chest pain, had Lexiscan Cardiolite that showed no ischemia.  No chest pain since 1/15.  She was started on Imdur in 1/15 but did not tolerate it.  - Continue ASA 81, Plavix, statin, Toprol XL.  - I do not think that her chest pain in 1/15 was likely to be ischemic (atypical, negative Cardiolite).     2. Atrial Arrhythmias Patient has not been on anticoagulation. She apparently had atrial fibrillation at some point in the past. It seems that the atrial fibrillation may have been post-op after redo CABG and has not recurred.  She has been on dronedarone. She is in NSR today. She had palpitations last fall.  Event monitor suggested that these symptoms may be coming from atrial tachycardia.  Symptoms seem to have been suppressed completely by Toprol XL.  Unfortunately, she is now volume overloaded with NYHA class III symptoms.  Use of dronedarone in this setting is not ideal.  As Toprol XL actually seemed to be more useful for suppressing her atrial tachycardia, I am going to have her stop dronedarone and increase Toprol XL to 75 mg daily.   3. HYPERLIPIDEMIA  Acceptable lipids 9/14, continue statin. .  4. Bioprosthetic aortic valve Well-seated on last echo.  5. Carotid bruit Repeat carotid dopplers in 10/15.   6. Chronic diastolic CHF Patient is more volume overloaded on exam than in the past with NYHA class III symptoms.  - Increase Lasix to 40 mg daily and KCl to 10 daily. - BMET/BNP in 10 days.  7. CKD Will need to follow creatinine carefully with diuresis.   Followup in 3-4 weeks.   Larey Dresser 09/24/2013

## 2013-10-02 ENCOUNTER — Other Ambulatory Visit: Payer: Self-pay | Admitting: Emergency Medicine

## 2013-10-02 ENCOUNTER — Other Ambulatory Visit (INDEPENDENT_AMBULATORY_CARE_PROVIDER_SITE_OTHER): Payer: Medicare Other

## 2013-10-02 DIAGNOSIS — I509 Heart failure, unspecified: Secondary | ICD-10-CM

## 2013-10-02 DIAGNOSIS — I2510 Atherosclerotic heart disease of native coronary artery without angina pectoris: Secondary | ICD-10-CM

## 2013-10-02 DIAGNOSIS — I4891 Unspecified atrial fibrillation: Secondary | ICD-10-CM

## 2013-10-02 LAB — BASIC METABOLIC PANEL
BUN: 30 mg/dL — ABNORMAL HIGH (ref 6–23)
CO2: 27 mEq/L (ref 19–32)
Calcium: 9.6 mg/dL (ref 8.4–10.5)
Chloride: 101 mEq/L (ref 96–112)
Creatinine, Ser: 1.4 mg/dL — ABNORMAL HIGH (ref 0.4–1.2)
GFR: 38.78 mL/min — ABNORMAL LOW (ref >60.00)
Glucose, Bld: 248 mg/dL — ABNORMAL HIGH (ref 70–99)
Potassium: 4.1 mEq/L (ref 3.5–5.1)
Sodium: 137 mEq/L (ref 135–145)

## 2013-10-02 LAB — BRAIN NATRIURETIC PEPTIDE: Pro B Natriuretic peptide (BNP): 95 pg/mL (ref 0.0–100.0)

## 2013-10-16 ENCOUNTER — Other Ambulatory Visit: Payer: Self-pay | Admitting: *Deleted

## 2013-10-17 ENCOUNTER — Other Ambulatory Visit: Payer: Self-pay | Admitting: Cardiology

## 2013-10-19 IMAGING — US US ABDOMEN COMPLETE
1 series · 14 of 25 positions shown · non-contrast
Comparison: None.

CLINICAL DATA: Abdominal pain.

COMPLETE ABDOMINAL ULTRASOUND

[Series 1: us abdomen complete · 0.32mm/px · 14 of 74 slices shown]
[im 1/74]
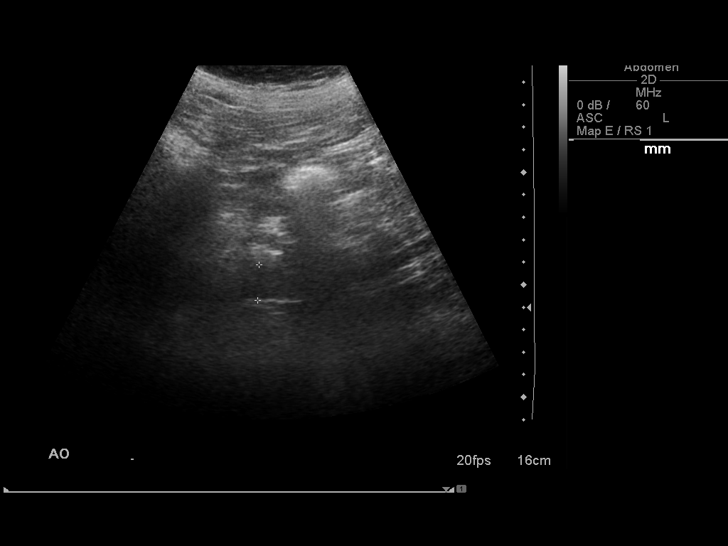
[im 7/74]
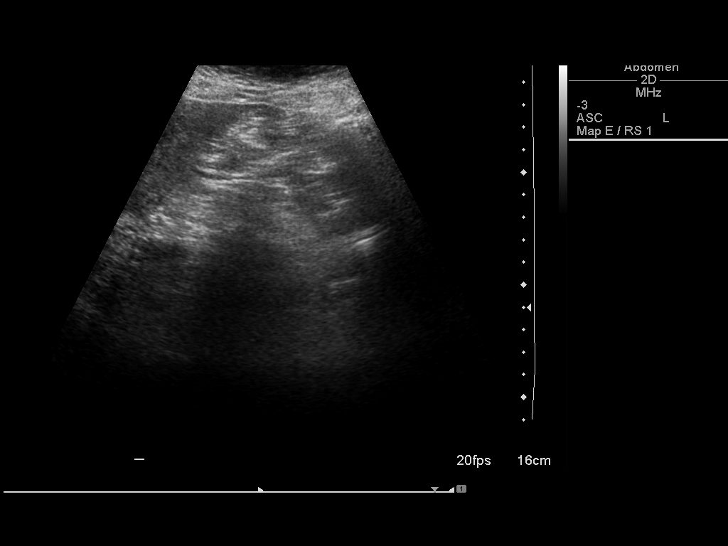
[im 13/74]
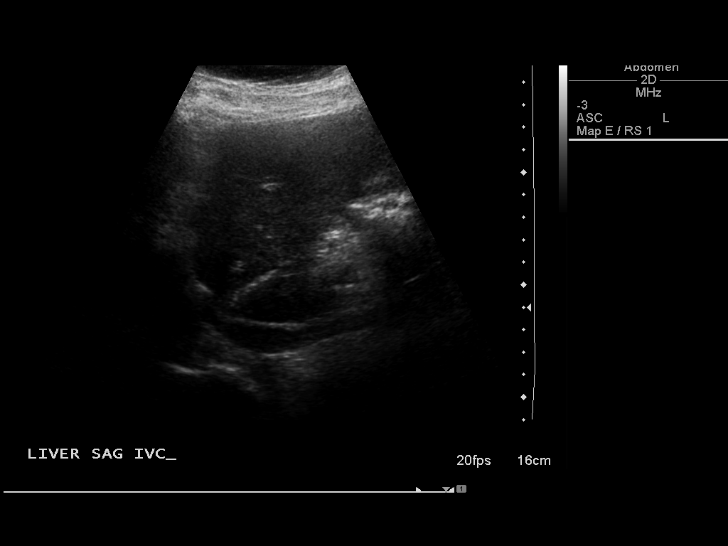
[im 19/74]
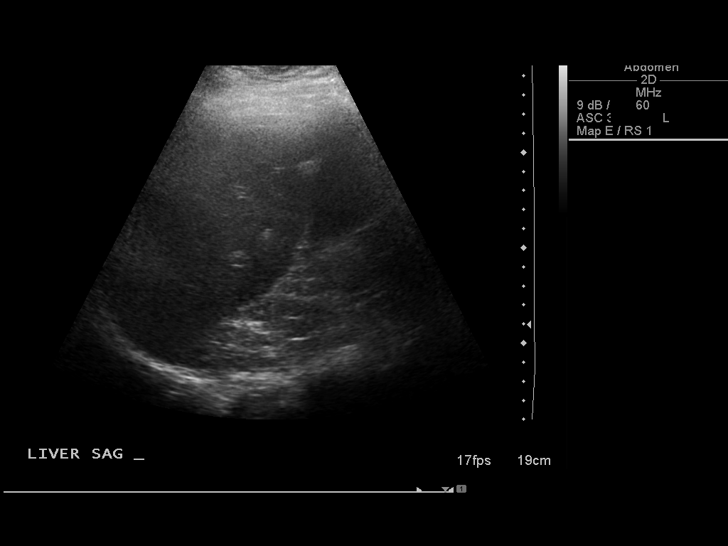
[im 25/74]
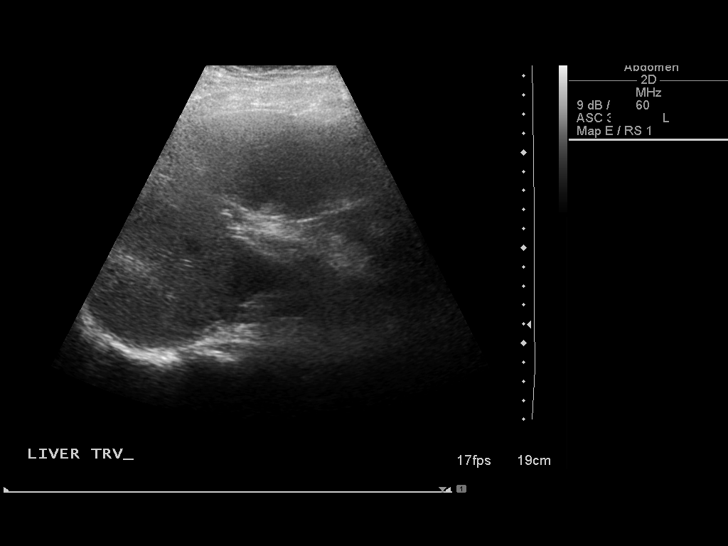
[im 28/74]
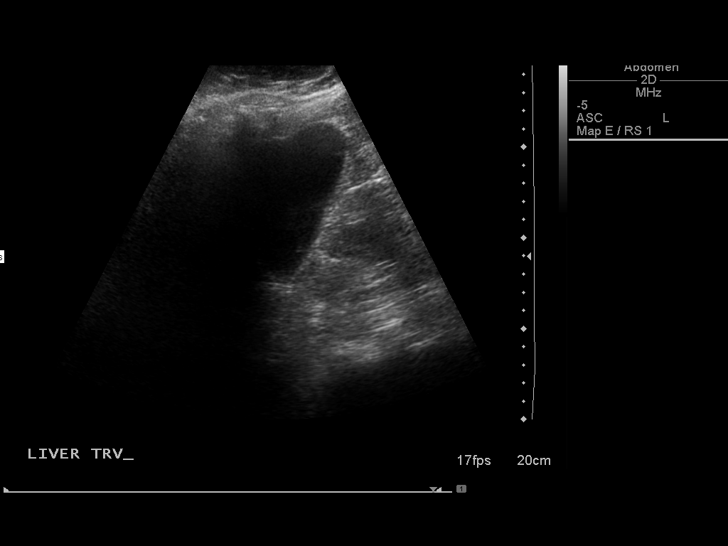
[im 34/74]
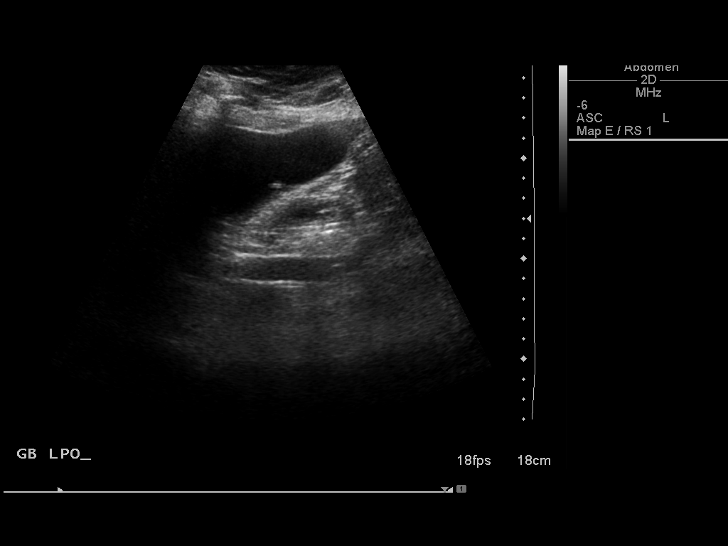
[im 40/74]
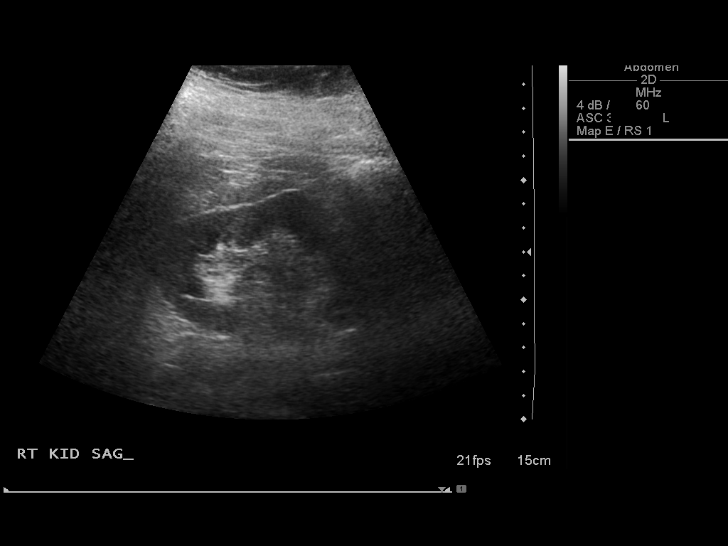
[im 46/74]
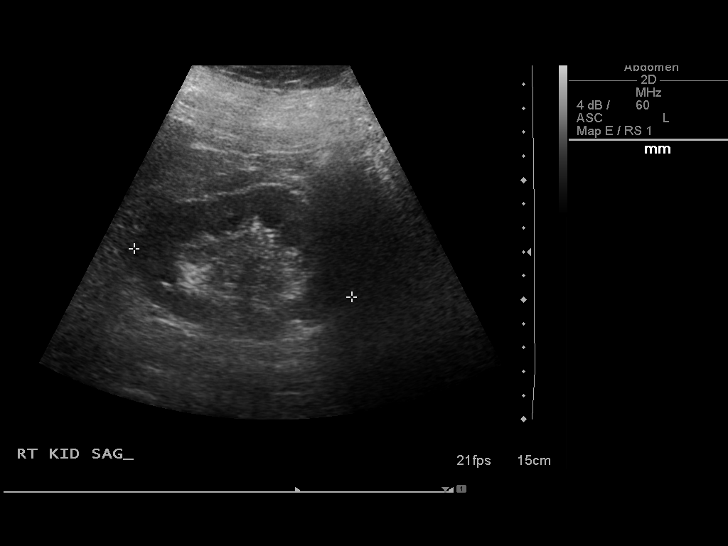
[im 49/74]
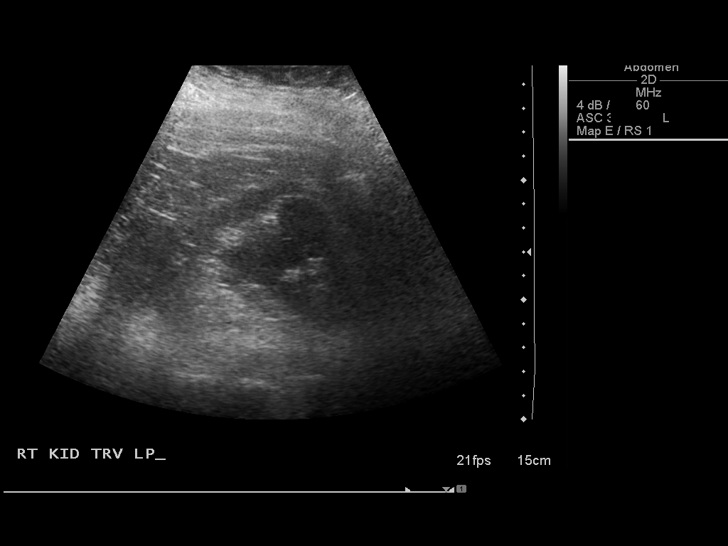
[im 55/74]
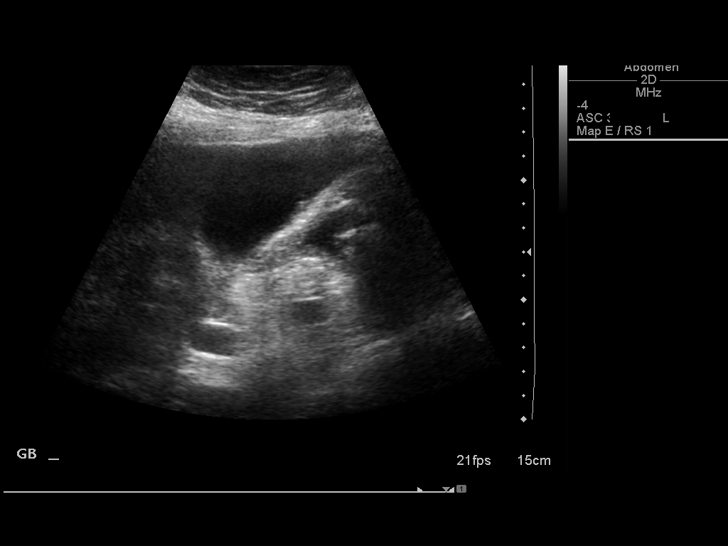
[im 61/74]
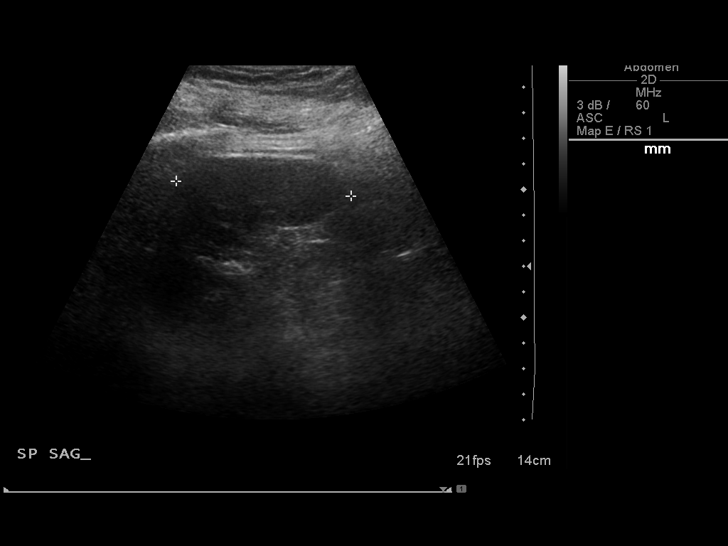
[im 67/74]
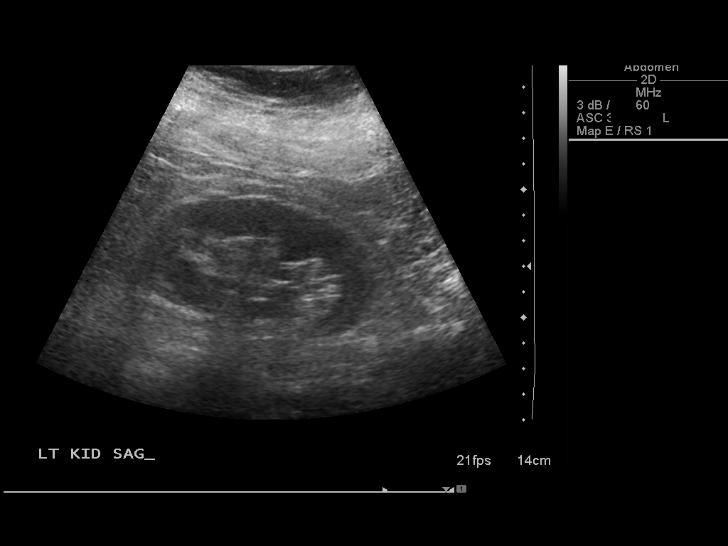
[im 74/74]
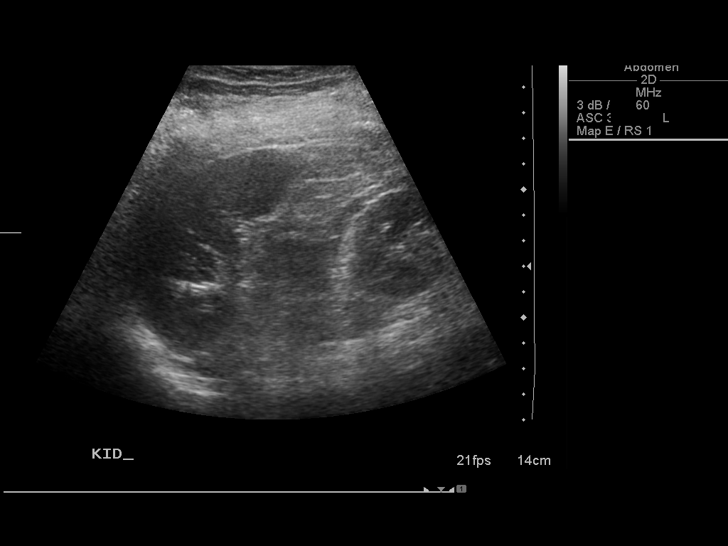

[14 of 25 positions shown; findings below may reference images not displayed]

FINDINGS: Gallbladder:  No gallstones, gallbladder wall thickening, or
pericholecystic fluid. Several gallbladder polyps are noted.

Common bile duct:  Measures 6.7 mm.

Liver:  No focal lesion identified.  Within normal limits in
parenchymal echogenicity.

IVC:  Appears normal.

Pancreas:  No focal abnormality seen.

Spleen:  Measures 6.9 cm.

Right Kidney:  Measures 9.3 cm.

Left Kidney:  OeasuresP.O cm.

Abdominal aorta:  No aneurysm identified.
IMPRESSION: Gallbladder polyps.  No other significant abnormality seen in the
abdomen.

## 2013-10-20 ENCOUNTER — Other Ambulatory Visit: Payer: Self-pay

## 2013-10-20 MED ORDER — AMLODIPINE BESYLATE 10 MG PO TABS: ORAL_TABLET | ORAL | Status: DC

## 2013-10-22 ENCOUNTER — Ambulatory Visit (INDEPENDENT_AMBULATORY_CARE_PROVIDER_SITE_OTHER): Payer: Medicare Other | Admitting: Cardiology

## 2013-10-22 ENCOUNTER — Encounter: Payer: Self-pay | Admitting: Cardiology

## 2013-10-22 VITALS — BP 118/62 | HR 92 | Ht 65.0 in | Wt 221.1 lb

## 2013-10-22 DIAGNOSIS — Z952 Presence of prosthetic heart valve: Secondary | ICD-10-CM

## 2013-10-22 DIAGNOSIS — Z954 Presence of other heart-valve replacement: Secondary | ICD-10-CM

## 2013-10-22 DIAGNOSIS — E785 Hyperlipidemia, unspecified: Secondary | ICD-10-CM

## 2013-10-22 DIAGNOSIS — I4891 Unspecified atrial fibrillation: Secondary | ICD-10-CM

## 2013-10-22 DIAGNOSIS — I5032 Chronic diastolic (congestive) heart failure: Secondary | ICD-10-CM

## 2013-10-22 DIAGNOSIS — I509 Heart failure, unspecified: Secondary | ICD-10-CM

## 2013-10-22 DIAGNOSIS — I2510 Atherosclerotic heart disease of native coronary artery without angina pectoris: Secondary | ICD-10-CM

## 2013-10-22 NOTE — Progress Notes (Signed)
Patient ID: Hannah [name], female   DOB: 01-Nov-1945, 68 y.o.   MRN: [id number] PCP: Dr. Suzy Bouchard  68 yo with history of CAD s/p CABG, bioprosthetic AVR, CKD, paroxysmal atrial fibrillation presents for cardiology followup.  She had her initial CABG in 1998, then had redo CABG in 2009 with bioprosthetic AVR.  She does not remember when she last had documented atrial fibrillation, but she thinks it was a number of years ago, sounds peri-operative around the time of her redo CABG.  She was never put on anticoagulation but has been on dronedarone.    Over the summer, she had frequent episodes of tachypalpitations.  In 9/14, she developed tachypalpitations and later chest pain.  In the ER, SBP was in the 200s with elevated HR. Troponin was mildly elevated.  Therefore, she had a cardiac cath showing patent grafts.  She was thought to have had demand ischemia in the setting of hypertensive emergency + tachycardia.  After discharge, she continued to have runs of tachypalpitations.  A 3 week event monitor was put on her.  This showed runs, sometimes long, of atrial tachycardia.  She was started on Toprol XL when these runs were noted.  No further palpitations (complete resolution of symptoms).    In 1/15, she was admitted to [facility] with atypical chest pain.  She was ruled out for MI and Lexiscan Cardiolite showed no ischemia.  She was started on Imdur and sent home.  After discharge, she developed a daily headache and nausea.  She did not eat or drink much due to the nausea. Shortly after this, she was standing in her grandson's pediatrician's office and became severely lightheaded.  She had to sit down.  She came to the ER and was found to be orthostatic.  She was given IV fluid and got better quickly.  I had her stop Imdur and symptoms seem to have completely resolved.    At last appointment, she appeared volume overloaded with increased dyspnea.  I increased her Lasix to 40 mg daily.  Since then, she has lost 3  lbs.  Her lower extremity edema has gone done considerably.  She is breathing better.  She can now walk up 2 flights of stairs with only mild dyspnea.  No dyspnea walking on flat ground.  Very rare and mild tachypalpitations.       ECG: NSR, nonspecific T wave flattening  Labs (2/13): BNP 80, K 5.3, creatinine 1.1 Labs (6/13): LDL 76, HDL 41 Labs (8/13): K 4.3, creatinine 1.38 Labs (9/13): creatinine 1.6, proBNP 128 Labs (9/14): K 4.7, creatinine 1.5, LDL 82, HDL 35 Labs (10/14): K 4.8, creatinine 1.5 Labs (2/15): K 4.1, creatinine 1.28, HCT 34 Labs (4/15): K 4.2, creatinine 1.5 => 1.4, BNP 95  PMH: 1. Diabetes mellitus 2. HTN 3. Obesity 4. Atrial arrhythmias: Paroxysmal atrial fibrillation seems to have been post-operative after CAD-AVR. She has a history of amiodarone toxicity. She has not been on coumadin.  3-week event monitor (9/13) with no atrial fibrillation.  3-week event monitor (10/14) with runs of probable atrial tachycardia.  5. CAD: CABG 1998 with LIMA-LAD/diagonal, sequential SVG-OM1/OM2, SVG-RCA.  Redo CABG 2009 with SVG-OM3, SVG-PDA.  Lexiscan myoview (9/13): EF 61%, no ischemia or infarction.  LHC (10/14) with all grafts patent except the old SVG-RCA.  There was 90% stenosis in the AV LCx before ungrafted MOM, but this was unchanged from prior study.  Lexiscan Cardiolite (1/15) with no ischemia. She does not tolerate Imdur.  6. CKD 7.  Bioprosthetic aortic valve: Echo (2011) with EF 50-55%, bioprosthetic aortic valve well-seated.  Echo (2/13) with EF 60-65%, bioprosthetic aortic valve looked ok.  Echo (9/14): EF 50-55%, mild LVH, bioprosthetic AVR with mean gradient 10, mild MR, moderate TR.  8. Asthma: since her teens. 9. Low back pain.  10. 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  Per neurologist, did not appear cardioembolic.  11. Carotid stenosis: 40-59% bilateral ICA stenosis on 10/14 carotid dopplers.  12. Sleep study 12/14 without significant OSA.  13. Chronic  diastolic CHF  SH: Never smoked.  Lives in [place].  Married.    FH: No premature CAD.   ROS: All systems reviewed and negative except as per HPI.    Current Outpatient Prescriptions  Medication Sig Dispense Refill  . albuterol (PROVENTIL) (2.5 MG/3ML) 0.083% nebulizer solution Take 3 mLs (2.5 mg total) by nebulization every 6 (six) hours as needed for wheezing or shortness of breath.  180 mL  0  . albuterol (PROVENTIL,VENTOLIN) 90 MCG/ACT inhaler Inhale 2 puffs into the lungs every 6 (six) hours as needed. For shortness of breath      . ALPRAZolam (XANAX) 0.25 MG tablet Take 1 tablet (0.25 mg total) by mouth at bedtime as needed for sleep.  30 tablet  0  . amLODipine (NORVASC) 10 MG tablet TAKE ONE TABLET BY MOUTH ONCE DAILY  30 tablet  6  . aspirin EC 81 MG EC tablet Take 1 tablet (81 mg total) by mouth daily.      . budesonide-formoterol (SYMBICORT) 160-4.5 MCG/ACT inhaler Inhale 2 puffs into the lungs 2 (two) times daily.  1 Inhaler  6  . clopidogrel (PLAVIX) 75 MG tablet Take 75 mg by mouth daily.      Noelle Penner ALLERGY RELIEF 10 MG tablet TAKE ONE TABLET BY MOUTH ONCE DAILY AS NEEDED FOR ALLERGY  30 tablet  0  . furosemide (LASIX) 40 MG tablet Take 1 tablet (40 mg total) by mouth daily.  30 tablet  3  . insulin glargine (LANTUS) 100 UNIT/ML injection Inject 50 Units into the skin every morning.       . insulin lispro (HUMALOG) 100 UNIT/ML injection Inject 10-20 Units into the skin See admin instructions. Per sliding scale 10-20 unit range 3 times daily      . JANUVIA 50 MG tablet Take 50 mg by mouth daily.       Marland Kitchen omeprazole (PRILOSEC) 40 MG capsule Take 40 mg by mouth 2 (two) times daily.      . potassium chloride (K-DUR) 10 MEQ tablet Take 1 tablet (10 mEq total) by mouth daily.  30 tablet  3  . rosuvastatin (CRESTOR) 40 MG tablet Take 1 tablet (40 mg total) by mouth daily.  30 tablet  11  . traMADol (ULTRAM) 50 MG tablet Take 50 mg by mouth every 8 (eight) hours as needed for moderate  pain.       . VENTOLIN HFA 108 (90 BASE) MCG/ACT inhaler INHALE TWO PUFFS EVERY 4 HOURS AS NEEDED  18 each  0  . metoprolol succinate (TOPROL-XL) 50 MG 24 hr tablet 1.5 tablets (75mg ) daily. Take with or immediately following a meal.  45 tablet  3   No current facility-administered medications for this visit.    BP 118/62  Pulse 92  Ht 5\' 5"  (1.651 m)  Wt 100.299 kg (221 lb 1.9 oz)  BMI 36.80 kg/m2 General: NAD Neck: Thick neck, JVP 7 cm, no thyromegaly or thyroid nodule.  Lungs: Clear to auscultation bilaterally with normal respiratory effort. CV: Nondisplaced PMI.  Heart regular S1/S2, no S3/S4, 2/6 SEM RUSB.  1+ ankle edema bilaterally.  Bilateral carotid bruits.  Normal pedal pulses.  Abdomen: Soft, nontender, no hepatosplenomegaly, no distention.  Neurologic: Alert and oriented x 3.  Psych: Normal affect. Extremities: No clubbing or cyanosis.   Assessment/Plan: 1. CAD  Admission in 9/14 with mild troponin elevation.  LHC showed stable anatomy with patent grafts except for SVG-RCA from older CABG.  Suspect that the troponin elevation was due to demand ischemia in the setting of hypertensive emergency and tachycardia (possible run of atrial tachycardia).  Admission in 1/15 with atypical chest pain, had Lexiscan Cardiolite that showed no ischemia.  No chest pain since 1/15.  She was started on Imdur in 1/15 but did not tolerate it.  - Continue ASA 81, Plavix, statin, Toprol XL.  - I do not think that her chest pain in 1/15 was likely to be ischemic (atypical, negative Cardiolite).     2. Atrial Arrhythmias Patient has not been on anticoagulation. She apparently had atrial fibrillation at some point in the past. It seems that the atrial fibrillation may have been post-op after redo CABG and has not recurred. She is in NSR today. She had palpitations last fall.  Event monitor suggested that these symptoms may be coming from atrial tachycardia.  Symptoms seem to have been suppressed almost  completely by Toprol XL.  3. HYPERLIPIDEMIA  Acceptable lipids 9/14, continue statin. .  4. Bioprosthetic aortic valve Well-seated on last echo.  5. Carotid bruit Repeat carotid dopplers in 10/15.   6. Chronic diastolic CHF Volume status looks much better on higher Lasix dosing.  Continue current Lasix.    7. CKD Will need to follow creatinine carefully with diuresis.   Followup with BMET in 4 months.   Larey Dresser 10/22/2013

## 2013-10-22 NOTE — Patient Instructions (Signed)
Your physician recommends that you schedule a follow-up appointment in: [facility]

## 2013-10-30 ENCOUNTER — Other Ambulatory Visit: Payer: Self-pay | Admitting: Emergency Medicine

## 2013-12-05 ENCOUNTER — Other Ambulatory Visit: Payer: Self-pay | Admitting: Emergency Medicine

## 2014-01-03 ENCOUNTER — Other Ambulatory Visit: Payer: Self-pay | Admitting: Emergency Medicine

## 2014-01-14 ENCOUNTER — Ambulatory Visit: Payer: Medicare Other | Admitting: Emergency Medicine

## 2014-01-14 ENCOUNTER — Encounter: Payer: Self-pay | Admitting: Emergency Medicine

## 2014-01-14 VITALS — BP 106/58 | HR 84 | Temp 97.4°F | Ht 64.5 in | Wt 216.6 lb

## 2014-01-14 DIAGNOSIS — J45909 Unspecified asthma, uncomplicated: Secondary | ICD-10-CM

## 2014-01-14 DIAGNOSIS — J309 Allergic rhinitis, unspecified: Secondary | ICD-10-CM

## 2014-01-14 NOTE — Assessment & Plan Note (Signed)
Please continue your Symbicort and albuterol as you are taking them Take loratadine daily Get the Flu shot this Fall! Follow with Dr Lamonte Sakai in 12 months or sooner if you have any problems

## 2014-01-14 NOTE — Assessment & Plan Note (Signed)
Appears to be stable on loratadine.

## 2014-01-14 NOTE — Patient Instructions (Signed)
Please continue your Symbicort and albuterol as you are taking them Take loratadine daily Get the Flu shot this Fall! Follow with Dr Lamonte Sakai in 12 months or sooner if you have any problems

## 2014-01-14 NOTE — Progress Notes (Signed)
Subjective:    Patient ID: Hannah [name], female    DOB: March 25, 1946, 68 y.o.   MRN: [id number]   HPI 68 yo never smoker, hx of CAD/CABG, aortic valve replacement, A Fib. Also with hx of childhood asthma. She was diagnosed with adult asthma in her 83's in setting wheeze, SOB, cough that wouldn't go away. Was on prn SABA for a while, started on Symbicort early 2009. Had PFT's done in August 2009 and 2010 that showed mixed disease. In 10/10 FEV1 1.01L. Averages Prednisone about 3 - 4x a year. Never hospitalized or on MV due to breathing. Amiodarone stopped late 2010.   ROV 10/14/09 -- returns for f/u of her asthma, restrictive disease. Tells me that she has had PND, allergy symptoms this spring, making her mor SOB and giving her more cough.   ROV 03/07/10 -- Hx CAD/CABG, asthma and allergies. Still with some nasal congestion, doing loratadine and fluticasone spray, rarely [place]. Does some throat clearing. Taking her symbicort reliably. Uses ventolin a couple times a month.   ROV 10/04/10 -- Hx CAD/CABG, asthma and allergies. Has been having more trouble with nasal gtt and allergies and therefore more cough chest tightness. Using Symbicort. Has needed ventolin about 4x a day for last week. More SOB. Can't tolerate the NSWs.   ROV 04/24/11 -- Hx CAD/CABG, asthma and allergies. Here for follow up. She was treated over phone for bronchitis after URI - better after doxy and prednisone. Still on Symbicort bid. She is much improved, back to baseline. She has had the flu shot. She is rarely using SABA - was requiring frequently when she was exacerbated. She is having several AE a year.   ROV 02/22/12 -- Hx CAD/CABG, asthma and allergies. She is undergoing eval for HA, nausea, imbalance. May have had a small CVA - had MRI and neuro eval. Sometimes these episodes are associated with SOB. She remains on Symbicort.  She is on loratadine, uses nasal steroid prn.   ROV 09/09/12 -- Hx CAD/CABG, asthma and allergies. She is  starting to have nasal gtt and cough. She can't do [place]. Interested in changing loratadine to alternative. She doesn't like the nasal steroids because they run down her throat. Still on Symbicort.   ROV 03/17/13 -- Hx CAD/CABG, asthma and allergies.  Returns for f/u. She keeps a baseline cough. No asthma flares since last time. Remains on Symbicort, needs a spacer. Went back to loratadine. Not using nasal washes or steroids. She has had Flu Shot.   ROV 01/12/14 -- follows for asthma, allergies, cough. She tells me that she has been doing well, is on symbicort, rarely needs albuterol. Takes loratadine, not currently on nasal steroid. She had Prevnar-13 in Sept '14. No exacerbations!  Review of Systems as per HPI    Objective:   Physical Exam Filed Vitals:   01/14/14 1410  BP: 106/58  Pulse: 84  Temp: 97.4 F (36.3 C)   Gen: Pleasant, obese, in no distress,  normal affect  ENT: No lesions,  mouth clear,  oropharynx clear, no postnasal drip  Neck: No JVD, no TMG, no carotid bruits  Lungs: No use of accessory muscles, no dullness to percussion, clear without rales or rhonchi  Cardiovascular: RRR, heart sounds normal, no murmur or gallops, no peripheral edema  Musculoskeletal: No deformities, no cyanosis or clubbing  Neuro: alert, non focal  Skin: Warm, no lesions or rashes   Assessment & Plan:  ASTHMA Please continue your Symbicort and albuterol as you are  taking them Take loratadine daily Get the Flu shot this Fall! Follow with Dr Lamonte Sakai in 12 months or sooner if you have any problems  ALLERGIC RHINITIS Appears to be stable on loratadine.

## 2014-02-03 ENCOUNTER — Other Ambulatory Visit: Payer: Self-pay | Admitting: Cardiology

## 2014-02-08 ENCOUNTER — Other Ambulatory Visit: Payer: Self-pay | Admitting: Emergency Medicine

## 2014-02-26 ENCOUNTER — Other Ambulatory Visit: Payer: Self-pay | Admitting: *Deleted

## 2014-02-28 ENCOUNTER — Other Ambulatory Visit: Payer: Self-pay | Admitting: Cardiology

## 2014-03-01 ENCOUNTER — Ambulatory Visit (INDEPENDENT_AMBULATORY_CARE_PROVIDER_SITE_OTHER): Payer: Medicare Other | Admitting: Cardiology

## 2014-03-01 ENCOUNTER — Encounter: Payer: Self-pay | Admitting: Cardiology

## 2014-03-01 ENCOUNTER — Encounter: Payer: Self-pay | Admitting: *Deleted

## 2014-03-01 VITALS — BP 110/52 | HR 84 | Ht 64.5 in | Wt 217.4 lb

## 2014-03-01 DIAGNOSIS — I5032 Chronic diastolic (congestive) heart failure: Secondary | ICD-10-CM

## 2014-03-01 DIAGNOSIS — Z954 Presence of other heart-valve replacement: Secondary | ICD-10-CM

## 2014-03-01 DIAGNOSIS — Z952 Presence of prosthetic heart valve: Secondary | ICD-10-CM

## 2014-03-01 DIAGNOSIS — I658 Occlusion and stenosis of other precerebral arteries: Secondary | ICD-10-CM

## 2014-03-01 DIAGNOSIS — I2510 Atherosclerotic heart disease of native coronary artery without angina pectoris: Secondary | ICD-10-CM

## 2014-03-01 DIAGNOSIS — I471 Supraventricular tachycardia: Secondary | ICD-10-CM

## 2014-03-01 DIAGNOSIS — I6529 Occlusion and stenosis of unspecified carotid artery: Secondary | ICD-10-CM

## 2014-03-01 DIAGNOSIS — I498 Other specified cardiac arrhythmias: Secondary | ICD-10-CM

## 2014-03-01 DIAGNOSIS — E785 Hyperlipidemia, unspecified: Secondary | ICD-10-CM

## 2014-03-01 DIAGNOSIS — N183 Chronic kidney disease, stage 3 unspecified: Secondary | ICD-10-CM

## 2014-03-01 DIAGNOSIS — I4891 Unspecified atrial fibrillation: Secondary | ICD-10-CM

## 2014-03-01 DIAGNOSIS — I6523 Occlusion and stenosis of bilateral carotid arteries: Secondary | ICD-10-CM

## 2014-03-01 DIAGNOSIS — I480 Paroxysmal atrial fibrillation: Secondary | ICD-10-CM

## 2014-03-01 LAB — BASIC METABOLIC PANEL
BUN: 24 mg/dL — ABNORMAL HIGH (ref 6–23)
CO2: 31 mEq/L (ref 19–32)
Calcium: 9.1 mg/dL (ref 8.4–10.5)
Chloride: 103 mEq/L (ref 96–112)
Creatinine, Ser: 1.6 mg/dL — ABNORMAL HIGH (ref 0.4–1.2)
GFR: 33.54 mL/min — ABNORMAL LOW (ref >60.00)
Glucose, Bld: 81 mg/dL (ref 70–99)
Potassium: 3.4 mEq/L — ABNORMAL LOW (ref 3.5–5.1)
Sodium: 143 mEq/L (ref 135–145)

## 2014-03-01 LAB — LIPID PANEL
Cholesterol: 159 mg/dL (ref 0–200)
HDL: 31.6 mg/dL — ABNORMAL LOW (ref >39.00)
NonHDL: 127.4
Total CHOL/HDL Ratio: 5
Triglycerides: 206 mg/dL — ABNORMAL HIGH (ref 0.0–149.0)
VLDL: 41.2 mg/dL — ABNORMAL HIGH (ref 0.0–40.0)

## 2014-03-01 LAB — LDL CHOLESTEROL, DIRECT: Direct LDL: 109.2 mg/dL

## 2014-03-01 MED ORDER — METOPROLOL SUCCINATE ER 50 MG PO TB24: 50.0000 mg | ORAL_TABLET | Freq: Two times a day (BID) | ORAL | Status: DC

## 2014-03-01 NOTE — Patient Instructions (Addendum)
Increase Toprol XL(metoprolol succinate) to 50mg  two times a day.   Your physician recommends that you return for lab work today--BMET/Lipid profile.  Your physician has requested that you have a carotid duplex. This test is an ultrasound of the carotid arteries in your neck. It looks at blood flow through these arteries that supply the brain with blood. Allow one hour for this exam. There are no restrictions or special instructions. October 2015  Your physician wants you to follow-up in: 4 months with Dr Aundra Dubin. (January 2016).  You will receive a reminder letter in the mail two months in advance. If you don't receive a letter, please call our office to schedule the follow-up appointment.

## 2014-03-02 NOTE — Progress Notes (Signed)
Patient ID: Hannah [name], female   DOB: Sep 10, 1945, 68 y.o.   MRN: [id number] PCP: Dr. Suzy Bouchard  68 yo with history of CAD s/p CABG, bioprosthetic AVR, CKD, paroxysmal atrial fibrillation presents for cardiology followup.  She had her initial CABG in 1998, then had redo CABG in 2009 with bioprosthetic AVR.  She does not remember when she last had documented atrial fibrillation, but she thinks it was a number of years ago, sounds peri-operative around the time of her redo CABG.  She was never put on anticoagulation but has been on dronedarone.    In 9/14, she developed tachypalpitations and later chest pain.  In the ER, SBP was in the 200s with elevated HR. Troponin was mildly elevated.  Therefore, she had a cardiac cath showing patent grafts.  She was thought to have had demand ischemia in the setting of hypertensive emergency + tachycardia.  After discharge, she continued to have runs of tachypalpitations.  A 3 week event monitor was put on her.  This showed runs, sometimes long, of atrial tachycardia.  She was started on Toprol XL when these runs were noted.  No further palpitations (complete resolution of symptoms).    In 1/15, she was admitted to [facility] with atypical chest pain.  She was ruled out for MI and Lexiscan Cardiolite showed no ischemia.  She was started on Imdur and sent home.  After discharge, she developed a daily headache and nausea.  She did not eat or drink much due to the nausea. Shortly after this, she was standing in her grandson's pediatrician's office and became severely lightheaded.  She had to sit down.  She came to the ER and was found to be orthostatic.  She was given IV fluid and got better quickly.  I had her stop Imdur and symptoms seem to have completely resolved.    She is stable currently.  She has occasional "pinching" sensation in her central chest lasting < 10 minutes, never with exertion.  She has occasional mild palpitations.  No lightheadedness or syncope.  She has  had about 5 episodes of palpitations in the last 10 days. Weight is down 4 lbs.  Dyspnea walking up hills or up a flight of stairs.   ECG: NSR, inferior/lateral/anterolateral T wave inversions  Labs (2/13): BNP 80, K 5.3, creatinine 1.1 Labs (6/13): LDL 76, HDL 41 Labs (8/13): K 4.3, creatinine 1.38 Labs (9/13): creatinine 1.6, proBNP 128 Labs (9/14): K 4.7, creatinine 1.5, LDL 82, HDL 35 Labs (10/14): K 4.8, creatinine 1.5 Labs (1/15): LDL 105, HDL 37 Labs (2/15): K 4.1, creatinine 1.28, HCT 34 Labs (4/15): K 4.2, creatinine 1.5 => 1.4, BNP 95  PMH: 1. Diabetes mellitus 2. HTN 3. Obesity 4. Atrial arrhythmias: Paroxysmal atrial fibrillation seems to have been post-operative after CAD-AVR. She has a history of amiodarone toxicity. She has not been on coumadin.  3-week event monitor (9/13) with no atrial fibrillation.  3-week event monitor (10/14) with runs of probable atrial tachycardia.  5. CAD: CABG 1998 with LIMA-LAD/diagonal, sequential SVG-OM1/OM2, SVG-RCA.  Redo CABG 2009 with SVG-OM3, SVG-PDA.  Lexiscan myoview (9/13): EF 61%, no ischemia or infarction.  LHC (10/14) with all grafts patent except the old SVG-RCA.  There was 90% stenosis in the AV LCx before ungrafted MOM, but this was unchanged from prior study.  Lexiscan Cardiolite (1/15) with no ischemia. She does not tolerate Imdur.  6. CKD 7. Bioprosthetic aortic valve: Echo (2011) with EF 50-55%, bioprosthetic aortic valve well-seated.  Echo (  2/13) with EF 60-65%, bioprosthetic aortic valve looked ok.  Echo (9/14): EF 50-55%, mild LVH, bioprosthetic AVR with mean gradient 10, mild MR, moderate TR.  8. Asthma: since her teens. 9. Low back pain.  10. 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  Per neurologist, did not appear cardioembolic.  11. Carotid stenosis: 40-59% bilateral ICA stenosis on 10/14 carotid dopplers.  12. Sleep study 12/14 without significant OSA.  13. Chronic diastolic CHF  SH: Never smoked.  Lives in [place].  Married.    FH: No premature CAD.   ROS: All systems reviewed and negative except as per HPI.    Current Outpatient Prescriptions  Medication Sig Dispense Refill  . ACCU-CHEK AVIVA PLUS test strip as directed.      Marland Kitchen albuterol (PROVENTIL) (2.5 MG/3ML) 0.083% nebulizer solution Take 3 mLs (2.5 mg total) by nebulization every 6 (six) hours as needed for wheezing or shortness of breath.  180 mL  0  . albuterol (PROVENTIL,VENTOLIN) 90 MCG/ACT inhaler Inhale 2 puffs into the lungs every 6 (six) hours as needed. For shortness of breath      . ALPRAZolam (XANAX) 0.25 MG tablet Take 1 tablet (0.25 mg total) by mouth at bedtime as needed for sleep.  30 tablet  0  . amLODipine (NORVASC) 10 MG tablet TAKE ONE TABLET BY MOUTH ONCE DAILY  30 tablet  6  . aspirin EC 81 MG EC tablet Take 1 tablet (81 mg total) by mouth daily.      . budesonide-formoterol (SYMBICORT) 160-4.5 MCG/ACT inhaler Inhale 2 puffs into the lungs 2 (two) times daily.  1 Inhaler  6  . clopidogrel (PLAVIX) 75 MG tablet Take 75 mg by mouth daily.      Noelle Penner ALLERGY RELIEF 10 MG tablet TAKE ONE TABLET BY MOUTH ONCE DAILY AS NEEDED FOR ALLERGY  30 tablet  0  . furosemide (LASIX) 40 MG tablet Take 1 tablet (40 mg total) by mouth daily.  30 tablet  3  . insulin glargine (LANTUS) 100 UNIT/ML injection Inject 60 Units into the skin every morning.       . insulin lispro (HUMALOG) 100 UNIT/ML injection Inject 10-20 Units into the skin See admin instructions. Per sliding scale 10-20 unit range 3 times daily      . JANUVIA 50 MG tablet Take 50 mg by mouth daily.       Marland Kitchen omeprazole (PRILOSEC) 40 MG capsule Take 40 mg by mouth 2 (two) times daily.      . potassium chloride (K-DUR) 10 MEQ tablet Take 1 tablet (10 mEq total) by mouth daily.  30 tablet  3  . rosuvastatin (CRESTOR) 40 MG tablet Take 1 tablet (40 mg total) by mouth daily.  30 tablet  11  . traMADol (ULTRAM) 50 MG tablet Take 50 mg by mouth every 8 (eight) hours as needed for  moderate pain.       . VENTOLIN HFA 108 (90 BASE) MCG/ACT inhaler INHALE TWO PUFFS EVERY 4 HOURS AS NEEDED  18 each  0  . metoprolol succinate (TOPROL XL) 50 MG 24 hr tablet Take 1 tablet (50 mg total) by mouth 2 (two) times daily. Take with or immediately following a meal.  60 tablet  6   No current facility-administered medications for this visit.    BP 110/52  Pulse 84  Ht 5' 4.5" (1.638 m)  Wt 217 lb 6.4 oz (98.612 kg)  BMI 36.75 kg/m2 General: NAD Neck: Thick neck,  JVP 7 cm, no thyromegaly or thyroid nodule.  Lungs: Clear to auscultation bilaterally with normal respiratory effort. CV: Nondisplaced PMI.  Heart regular S1/S2, no S3/S4, 1/6 SEM RUSB.  1+ edema 1/2 to knees bilaterally.  Bilateral carotid bruits.  Normal pedal pulses.  Abdomen: Soft, nontender, no hepatosplenomegaly, no distention.  Neurologic: Alert and oriented x 3.  Psych: Normal affect. Extremities: No clubbing or cyanosis.   Assessment/Plan: 1. CAD  Admission in 9/14 with mild troponin elevation.  LHC showed stable anatomy with patent grafts except for SVG-RCA from older CABG.  Suspect that the troponin elevation was due to demand ischemia in the setting of hypertensive emergency and tachycardia (possible run of atrial tachycardia).  Admission in 1/15 with atypical chest pain, had Lexiscan Cardiolite that showed no ischemia.  She has only rare atypical chest pain.  - Continue ASA 81, Plavix, statin, Toprol XL.      2. Atrial Arrhythmias Patient has not been on anticoagulation. She apparently had atrial fibrillation at some point in the past. It seems that the atrial fibrillation may have been post-op after redo CABG and has not recurred. She is in NSR today. She has occasional mild palpitations. Event monitor suggested that these symptoms may be coming from atrial tachycardia.  As she continues to have some palpitations, I will increase Toprol XL to 50 mg bid.  3. HYPERLIPIDEMIA  Goal LDL < 70, above goal in 1/15.   I will repeat lipids today.  If not at goal, add Zetia 10 mg daily to Crestor 40 mg daily.   4. Bioprosthetic aortic valve Well-seated on last echo.  5. Carotid bruit Repeat carotid dopplers in 10/15.   6. Chronic diastolic CHF Near-euvolemic on current Lasix dosing.   7. CKD Check BMET today.    Followup in 4 months.   Loralie Champagne 03/02/2014

## 2014-03-04 ENCOUNTER — Telehealth: Payer: Self-pay | Admitting: *Deleted

## 2014-03-04 DIAGNOSIS — I5032 Chronic diastolic (congestive) heart failure: Secondary | ICD-10-CM

## 2014-03-04 DIAGNOSIS — E876 Hypokalemia: Secondary | ICD-10-CM

## 2014-03-04 DIAGNOSIS — E785 Hyperlipidemia, unspecified: Secondary | ICD-10-CM

## 2014-03-04 MED ORDER — EZETIMIBE 10 MG PO TABS: 10.0000 mg | ORAL_TABLET | Freq: Every day | ORAL | Status: DC

## 2014-03-04 MED ORDER — POTASSIUM CHLORIDE CRYS ER 20 MEQ PO TBCR: 20.0000 meq | EXTENDED_RELEASE_TABLET | Freq: Every day | ORAL | Status: DC

## 2014-03-04 NOTE — Telephone Encounter (Signed)
Notes Recorded by Larey Dresser, MD on 03/04/2014   LDL high, increase Zetia to 10 mg daily. Increase KCl to 20 daily. Needs lipids/LFTs 2 months and BMET in 2 wks.

## 2014-03-09 ENCOUNTER — Other Ambulatory Visit: Payer: Self-pay | Admitting: Emergency Medicine

## 2014-03-11 ENCOUNTER — Telehealth: Payer: Self-pay | Admitting: Emergency Medicine

## 2014-03-11 MED ORDER — BUDESONIDE-FORMOTEROL FUMARATE 160-4.5 MCG/ACT IN AERO: 2.0000 | INHALATION_SPRAY | Freq: Two times a day (BID) | RESPIRATORY_TRACT | Status: DC

## 2014-03-11 NOTE — Telephone Encounter (Signed)
Called and spoke to pt. Pt requesting symbicort refill. Refill sent to preferred pharm. Nothing further needed.

## 2014-03-15 ENCOUNTER — Other Ambulatory Visit: Payer: Self-pay | Admitting: Nurse Practitioner

## 2014-03-15 ENCOUNTER — Other Ambulatory Visit: Payer: Self-pay | Admitting: Emergency Medicine

## 2014-03-17 ENCOUNTER — Other Ambulatory Visit: Payer: Self-pay | Admitting: *Deleted

## 2014-03-17 MED ORDER — FUROSEMIDE 40 MG PO TABS: 40.0000 mg | ORAL_TABLET | Freq: Every day | ORAL | Status: DC

## 2014-03-18 ENCOUNTER — Other Ambulatory Visit (INDEPENDENT_AMBULATORY_CARE_PROVIDER_SITE_OTHER): Payer: Medicare Other | Admitting: *Deleted

## 2014-03-18 DIAGNOSIS — E785 Hyperlipidemia, unspecified: Secondary | ICD-10-CM

## 2014-03-18 DIAGNOSIS — I5032 Chronic diastolic (congestive) heart failure: Secondary | ICD-10-CM

## 2014-03-18 DIAGNOSIS — E876 Hypokalemia: Secondary | ICD-10-CM

## 2014-03-18 LAB — BASIC METABOLIC PANEL
BUN: 21 mg/dL (ref 6–23)
CO2: 30 mEq/L (ref 19–32)
Calcium: 9.2 mg/dL (ref 8.4–10.5)
Chloride: 104 mEq/L (ref 96–112)
Creatinine, Ser: 2 mg/dL — ABNORMAL HIGH (ref 0.4–1.2)
GFR: 25.85 mL/min — ABNORMAL LOW (ref >60.00)
Glucose, Bld: 119 mg/dL — ABNORMAL HIGH (ref 70–99)
Potassium: 4 mEq/L (ref 3.5–5.1)
Sodium: 141 mEq/L (ref 135–145)

## 2014-03-22 ENCOUNTER — Telehealth: Payer: Self-pay

## 2014-03-22 DIAGNOSIS — R6889 Other general symptoms and signs: Secondary | ICD-10-CM

## 2014-03-22 DIAGNOSIS — Z79899 Other long term (current) drug therapy: Secondary | ICD-10-CM

## 2014-03-22 NOTE — Progress Notes (Signed)
Patient informed. 

## 2014-03-22 NOTE — Telephone Encounter (Signed)
Patient informed. Will return on 10/26 for labs.

## 2014-03-23 ENCOUNTER — Other Ambulatory Visit: Payer: Self-pay

## 2014-03-23 ENCOUNTER — Telehealth: Payer: Self-pay

## 2014-03-23 MED ORDER — FUROSEMIDE 40 MG PO TABS: 20.0000 mg | ORAL_TABLET | Freq: Every day | ORAL | Status: DC

## 2014-03-23 NOTE — Telephone Encounter (Signed)
Patient instructed to hold Lasix x 2 days then decrease to 20 mg daily. Lab appointment already scheduled for 10/26. Instructed patient to call if she experiences increased dyspnea. Weigh daily and follow low sodium diet. No NSAIDs.

## 2014-03-31 ENCOUNTER — Telehealth: Payer: Self-pay | Admitting: Cardiology

## 2014-03-31 MED ORDER — FUROSEMIDE 40 MG PO TABS: 20.0000 mg | ORAL_TABLET | Freq: Two times a day (BID) | ORAL | Status: DC

## 2014-03-31 NOTE — Telephone Encounter (Signed)
Per Dr. Liane Comber, patient needs to increase Lasix to 20 mg bid and keep her appointment for labs on 04/05/14. Patient voices good understanding.

## 2014-03-31 NOTE — Telephone Encounter (Signed)
New message    Patient calling stating nurse called her regarding cutting back lasix 20 mg   Patient noticing difficulty breathing, swelling , by night time gain  5 lbs, in the morning the weight gain is gone.   In the evenig feels very tight in chest.

## 2014-03-31 NOTE — Telephone Encounter (Signed)
Patient report that since decreasing her Lasix to 20 mg daily a week ago, she is noticing more swelling overall in her body. Says it always is down in the am. Also has a tightness in her chest in the evening, but has a h/o asthma. Not really sob or dyspneic.

## 2014-04-05 ENCOUNTER — Other Ambulatory Visit (INDEPENDENT_AMBULATORY_CARE_PROVIDER_SITE_OTHER): Payer: Medicare Other | Admitting: *Deleted

## 2014-04-05 DIAGNOSIS — R6889 Other general symptoms and signs: Secondary | ICD-10-CM

## 2014-04-05 LAB — BASIC METABOLIC PANEL
BUN: 23 mg/dL (ref 6–23)
CO2: 25 mEq/L (ref 19–32)
Calcium: 9.2 mg/dL (ref 8.4–10.5)
Chloride: 99 mEq/L (ref 96–112)
Creatinine, Ser: 1.5 mg/dL — ABNORMAL HIGH (ref 0.4–1.2)
GFR: 35.55 mL/min — ABNORMAL LOW (ref >60.00)
Glucose, Bld: 224 mg/dL — ABNORMAL HIGH (ref 70–99)
Potassium: 3.5 mEq/L (ref 3.5–5.1)
Sodium: 138 mEq/L (ref 135–145)

## 2014-04-12 ENCOUNTER — Telehealth: Payer: Self-pay | Admitting: Cardiology

## 2014-04-12 ENCOUNTER — Ambulatory Visit (HOSPITAL_COMMUNITY): Payer: Medicare Other | Attending: Cardiology | Admitting: Cardiology

## 2014-04-12 DIAGNOSIS — E119 Type 2 diabetes mellitus without complications: Secondary | ICD-10-CM | POA: Diagnosis not present

## 2014-04-12 DIAGNOSIS — I5032 Chronic diastolic (congestive) heart failure: Secondary | ICD-10-CM

## 2014-04-12 DIAGNOSIS — I471 Supraventricular tachycardia: Secondary | ICD-10-CM

## 2014-04-12 DIAGNOSIS — Z8673 Personal history of transient ischemic attack (TIA), and cerebral infarction without residual deficits: Secondary | ICD-10-CM | POA: Diagnosis not present

## 2014-04-12 DIAGNOSIS — R0989 Other specified symptoms and signs involving the circulatory and respiratory systems: Secondary | ICD-10-CM | POA: Diagnosis present

## 2014-04-12 DIAGNOSIS — E785 Hyperlipidemia, unspecified: Secondary | ICD-10-CM | POA: Insufficient documentation

## 2014-04-12 DIAGNOSIS — I480 Paroxysmal atrial fibrillation: Secondary | ICD-10-CM

## 2014-04-12 DIAGNOSIS — Z952 Presence of prosthetic heart valve: Secondary | ICD-10-CM

## 2014-04-12 DIAGNOSIS — I2510 Atherosclerotic heart disease of native coronary artery without angina pectoris: Secondary | ICD-10-CM | POA: Diagnosis not present

## 2014-04-12 DIAGNOSIS — N183 Chronic kidney disease, stage 3 (moderate): Secondary | ICD-10-CM

## 2014-04-12 DIAGNOSIS — I10 Essential (primary) hypertension: Secondary | ICD-10-CM | POA: Insufficient documentation

## 2014-04-12 DIAGNOSIS — Z7951 Long term (current) use of inhaled steroids: Secondary | ICD-10-CM | POA: Diagnosis not present

## 2014-04-12 DIAGNOSIS — I6523 Occlusion and stenosis of bilateral carotid arteries: Secondary | ICD-10-CM

## 2014-04-12 NOTE — Telephone Encounter (Signed)
New message  ° ° °Returning call back to nurse.  °

## 2014-04-12 NOTE — Progress Notes (Signed)
Carotid duplex performed 

## 2014-04-12 NOTE — Telephone Encounter (Signed)
Spoke with patient about lab results.

## 2014-04-22 ENCOUNTER — Other Ambulatory Visit: Payer: Self-pay | Admitting: Emergency Medicine

## 2014-05-05 ENCOUNTER — Other Ambulatory Visit (INDEPENDENT_AMBULATORY_CARE_PROVIDER_SITE_OTHER): Payer: Medicare Other | Admitting: *Deleted

## 2014-05-05 DIAGNOSIS — I5032 Chronic diastolic (congestive) heart failure: Secondary | ICD-10-CM

## 2014-05-05 DIAGNOSIS — E876 Hypokalemia: Secondary | ICD-10-CM

## 2014-05-05 DIAGNOSIS — E785 Hyperlipidemia, unspecified: Secondary | ICD-10-CM

## 2014-05-05 LAB — LIPID PANEL
Cholesterol: 122 mg/dL (ref 0–200)
HDL: 31.9 mg/dL — ABNORMAL LOW (ref >39.00)
LDL Cholesterol: 58 mg/dL (ref 0–99)
NonHDL: 90.1
Total CHOL/HDL Ratio: 4
Triglycerides: 163 mg/dL — ABNORMAL HIGH (ref 0.0–149.0)
VLDL: 32.6 mg/dL (ref 0.0–40.0)

## 2014-05-05 LAB — HEPATIC FUNCTION PANEL
ALT: 13 U/L (ref 0–35)
AST: 22 U/L (ref 0–37)
Albumin: 3.3 g/dL — ABNORMAL LOW (ref 3.5–5.2)
Alkaline Phosphatase: 58 U/L (ref 39–117)
Bilirubin, Direct: 0 mg/dL (ref 0.0–0.3)
Total Bilirubin: 0.8 mg/dL (ref 0.2–1.2)
Total Protein: 6.6 g/dL (ref 6.0–8.3)

## 2014-05-20 ENCOUNTER — Encounter (HOSPITAL_COMMUNITY): Payer: Self-pay | Admitting: Cardiovascular Disease

## 2014-06-18 ENCOUNTER — Other Ambulatory Visit: Payer: Self-pay | Admitting: Cardiology

## 2014-07-04 ENCOUNTER — Other Ambulatory Visit: Payer: Self-pay | Admitting: Cardiology

## 2014-07-09 ENCOUNTER — Ambulatory Visit (INDEPENDENT_AMBULATORY_CARE_PROVIDER_SITE_OTHER): Payer: Medicare Other | Admitting: Nurse Practitioner

## 2014-07-09 ENCOUNTER — Encounter: Payer: Self-pay | Admitting: Nurse Practitioner

## 2014-07-09 VITALS — BP 132/60 | HR 81 | Ht 64.5 in | Wt 208.0 lb

## 2014-07-09 DIAGNOSIS — I2510 Atherosclerotic heart disease of native coronary artery without angina pectoris: Secondary | ICD-10-CM

## 2014-07-09 DIAGNOSIS — I5032 Chronic diastolic (congestive) heart failure: Secondary | ICD-10-CM

## 2014-07-09 DIAGNOSIS — I10 Essential (primary) hypertension: Secondary | ICD-10-CM

## 2014-07-09 DIAGNOSIS — I480 Paroxysmal atrial fibrillation: Secondary | ICD-10-CM

## 2014-07-09 DIAGNOSIS — R002 Palpitations: Secondary | ICD-10-CM

## 2014-07-09 DIAGNOSIS — N183 Chronic kidney disease, stage 3 (moderate): Secondary | ICD-10-CM

## 2014-07-09 DIAGNOSIS — Z954 Presence of other heart-valve replacement: Secondary | ICD-10-CM

## 2014-07-09 DIAGNOSIS — E785 Hyperlipidemia, unspecified: Secondary | ICD-10-CM

## 2014-07-09 DIAGNOSIS — Z952 Presence of prosthetic heart valve: Secondary | ICD-10-CM

## 2014-07-09 DIAGNOSIS — I6523 Occlusion and stenosis of bilateral carotid arteries: Secondary | ICD-10-CM

## 2014-07-09 DIAGNOSIS — I471 Supraventricular tachycardia: Secondary | ICD-10-CM

## 2014-07-09 LAB — CBC
HCT: 31.4 % — ABNORMAL LOW (ref 36.0–46.0)
Hemoglobin: 10.5 g/dL — ABNORMAL LOW (ref 12.0–15.0)
MCHC: 33.5 g/dL (ref 30.0–36.0)
MCV: 86.2 fl (ref 78.0–100.0)
Platelets: 203 10*3/uL (ref 150.0–400.0)
RBC: 3.64 Mil/uL — ABNORMAL LOW (ref 3.87–5.11)
RDW: 13.8 % (ref 11.5–15.5)
WBC: 9.8 10*3/uL (ref 4.0–10.5)

## 2014-07-09 LAB — BASIC METABOLIC PANEL
BUN: 22 mg/dL (ref 6–23)
CO2: 30 mEq/L (ref 19–32)
Calcium: 9.4 mg/dL (ref 8.4–10.5)
Chloride: 101 mEq/L (ref 96–112)
Creatinine, Ser: 1.64 mg/dL — ABNORMAL HIGH (ref 0.40–1.20)
GFR: 33.03 mL/min — ABNORMAL LOW (ref >60.00)
Glucose, Bld: 201 mg/dL — ABNORMAL HIGH (ref 70–99)
Potassium: 3.8 mEq/L (ref 3.5–5.1)
Sodium: 140 mEq/L (ref 135–145)

## 2014-07-09 LAB — TSH: TSH: 2.11 u[IU]/mL (ref 0.35–4.50)

## 2014-07-09 LAB — MAGNESIUM: Magnesium: 1.8 mg/dL (ref 1.5–2.5)

## 2014-07-09 MED ORDER — METOPROLOL SUCCINATE ER 25 MG PO TB24: 25.0000 mg | ORAL_TABLET | Freq: Two times a day (BID) | ORAL | Status: DC

## 2014-07-09 MED ORDER — METOPROLOL SUCCINATE ER 50 MG PO TB24: 50.0000 mg | ORAL_TABLET | Freq: Two times a day (BID) | ORAL | Status: DC

## 2014-07-09 NOTE — Progress Notes (Signed)
CARDIOLOGY OFFICE NOTE  Date:  07/09/2014    Hannah [name] Date of Birth: 02-23-1946 Medical Record [id number]  PCP:  Nicola Girt  Cardiologist:  Aundra Dubin    Chief Complaint  Patient presents with  . Palpitations    4 month check - seen for Dr. Aundra Dubin  . Coronary Artery Disease     History of Present Illness: Hannah [name] is a 69 y.o. female who presents today for a 4 month check. Seen for Dr. Aundra Dubin. She has a history of CAD s/p CABG, bioprosthetic AVR, CKD, paroxysmal atrial fibrillation presents for cardiology followup. She had her initial CABG in 1998, then had redo CABG in 2009 with bioprosthetic AVR. Other issues include obesity and diabetes. She has had dyspnea from amiodarone. Started on Plavix in the summer of 2013 - MRI had shown small vessel ischemic changes in the pons with concern for a small pontine CVA - negative   In 9/14, she developed tachypalpitations and later chest pain. In the ER, SBP was in the 200s with elevated HR. Troponin was mildly elevated. Therefore, she had a cardiac cath showing patent grafts. She was thought to have had demand ischemia in the setting of hypertensive emergency + tachycardia. After discharge, she continued to have runs of tachypalpitations. A 3 week event monitor was put on her. This showed runs, sometimes long, of atrial tachycardia. She was started on Toprol XL when these runs were noted. No further palpitations (complete resolution of symptoms).   In 1/15, she was admitted to [facility] with atypical chest pain. She was ruled out for MI and Lexiscan Cardiolite showed no ischemia. She was started on Imdur and sent home. After discharge, she developed a daily headache and nausea. She did not eat or drink much due to the nausea. Shortly after this, she was standing in her grandson's pediatrician's office and became severely lightheaded. She had to sit down. She came to the ER and was found to be orthostatic. She  was given IV fluid and got better quickly. Imdur was discontinued.  Last seen here back in September - felt to be stable. Had had some palpitations. Toprol was increased.  Chronic DOE noted.   Comes in today. Here alone. Back for an early visit today. Has had 4 to 5 episodes of palpitations - no trigger that she can identify. No caffeine use. Her BP and HR will be elevated during these spells. No syncope. She will take a Xanax and have improvement. Last spell was over a week ago. She is asking about increasing her Toprol further. She is actively losing weight by cutting back on her portions.    Past Medical History  Diagnosis Date  . Hypertension   . Hyperlipidemia   . Atrial fibrillation     in sinus on Multaq  . CHF (congestive heart failure)     EF is low normal at 50 to 55% per echo in Jan. 2011  . Renal insufficiency, mild   . Dyspnea     chronic; followed by Dr. Lamonte Sakai  . Nonischemic cardiomyopathy   . Anemia   . Diabetes mellitus   . Iron deficiency anemia   . High risk medication use   . CAD (coronary artery disease)     Remote CABG in 1998 with redo in 2009  . S/P AVR 2009  . Amiodarone toxicity   . Hearing loss of right ear   . Stroke     PMH: 1. Diabetes mellitus  2. HTN 3. Obesity 4. Atrial arrhythmias: Paroxysmal atrial fibrillation seems to have been post-operative after CAD-AVR. She has a history of amiodarone toxicity. She has not been on coumadin. 3-week event monitor (9/13) with no atrial fibrillation. 3-week event monitor (10/14) with runs of probable atrial tachycardia.  5. CAD: CABG 1998 with LIMA-LAD/diagonal, sequential SVG-OM1/OM2, SVG-RCA. Redo CABG 2009 with SVG-OM3, SVG-PDA. Lexiscan myoview (9/13): EF 61%, no ischemia or infarction. LHC (10/14) with all grafts patent except the old SVG-RCA. There was 90% stenosis in the AV LCx before ungrafted MOM, but this was unchanged from prior study. Lexiscan Cardiolite (1/15) with no ischemia. She does not  tolerate Imdur.  6. CKD 7. Bioprosthetic aortic valve: Echo (2011) with EF 50-55%, bioprosthetic aortic valve well-seated. Echo (2/13) with EF 60-65%, bioprosthetic aortic valve looked ok. Echo (9/14): EF 50-55%, mild LVH, bioprosthetic AVR with mean gradient 10, mild MR, moderate TR.  8. Asthma: since her teens. 9. Low back pain.  10. 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 Per neurologist, did not appear cardioembolic. 11. Carotid stenosis: 40-59% bilateral ICA stenosis on 10/14 carotid dopplers.  12. Sleep study 12/14 without significant OSA.  13. Chronic diastolic CHF  Past Surgical History  Procedure Laterality Date  . Coronary artery bypass graft  1998 & 2009    Had LIMA to DX/LAD, SVG to 2 marginal branches and SVG to [facility] originally; SVG to 3rd OM and PD at time of redo  . Aortic valve replacement  2009    #65mm [facility] Ease pericardial valve  . Cardiac catheterization  03/23/2008    Pre-redo CABG: L main OK, LAD (T), CFX (T), OM1 99%, RCA (T), LIMA-LAD OK, SVG-OM(?3) OK w/ little florw to OM2, SVG-RCA OK. EF NL  . Left heart catheterization with coronary/graft angiogram N/A 02/23/2013    Procedure: LEFT HEART CATHETERIZATION WITH Beatrix Fetters;  Surgeon: Blane Ohara, MD;  Location: [facility] CATH LAB;  Service: Cardiovascular;  Laterality: N/A;     Medications: Current Outpatient Prescriptions  Medication Sig Dispense Refill  . ACCU-CHEK AVIVA PLUS test strip as directed.    Marland Kitchen albuterol (PROVENTIL) (2.5 MG/3ML) 0.083% nebulizer solution Take 3 mLs (2.5 mg total) by nebulization every 6 (six) hours as needed for wheezing or shortness of breath. 180 mL 0  . albuterol (PROVENTIL,VENTOLIN) 90 MCG/ACT inhaler Inhale 2 puffs into the lungs every 6 (six) hours as needed. For shortness of breath    . ALPRAZolam (XANAX) 0.25 MG tablet Take 1 tablet (0.25 mg total) by mouth at bedtime as needed for sleep. 30 tablet 0  . amLODipine (NORVASC) 10 MG tablet TAKE ONE  TABLET BY MOUTH ONCE DAILY 30 tablet 0  . aspirin EC 81 MG EC tablet Take 1 tablet (81 mg total) by mouth daily.    . budesonide-formoterol (SYMBICORT) 160-4.5 MCG/ACT inhaler Inhale 2 puffs into the lungs 2 (two) times daily. 1 Inhaler 6  . Cholecalciferol (VITAMIN D3) 2000 UNITS CHEW Chew by mouth daily.    . clopidogrel (PLAVIX) 75 MG tablet Take 75 mg by mouth daily.    . CRESTOR 40 MG tablet TAKE ONE TABLET BY MOUTH ONCE DAILY 30 tablet 9  . EQ ALLERGY RELIEF 10 MG tablet TAKE ONE TABLET BY MOUTH ONCE DAILY AS NEEDED FOR ALLERGY 30 tablet 0  . furosemide (LASIX) 40 MG tablet Take 0.5 tablets (20 mg total) by mouth 2 (two) times daily. 30 tablet 5  . insulin glargine (LANTUS) 100 UNIT/ML injection Inject 60  Units into the skin every morning.     . insulin lispro (HUMALOG) 100 UNIT/ML injection Inject 10-20 Units into the skin See admin instructions. Per sliding scale 10-20 unit range 3 times daily    . JANUVIA 50 MG tablet Take 50 mg by mouth daily.     . metoprolol succinate (TOPROL XL) 50 MG 24 hr tablet Take 1 tablet (50 mg total) by mouth 2 (two) times daily. Take with or immediately following a meal. 60 tablet 6  . omeprazole (PRILOSEC) 40 MG capsule Take 40 mg by mouth 2 (two) times daily.    . potassium chloride SA (KLOR-CON M20) 20 MEQ tablet Take 1 tablet (20 mEq total) by mouth daily. 30 tablet 6  . traMADol (ULTRAM) 50 MG tablet Take 50 mg by mouth every 8 (eight) hours as needed for moderate pain.     . VENTOLIN HFA 108 (90 BASE) MCG/ACT inhaler INHALE TWO PUFFS EVERY 4 HOURS AS NEEDED 18 each 0  . ZETIA 10 MG tablet TAKE ONE TABLET BY MOUTH ONCE DAILY 30 tablet 0  . metoprolol succinate (TOPROL XL) 25 MG 24 hr tablet Take 1 tablet (25 mg total) by mouth 2 (two) times daily. 60 tablet 6   No current facility-administered medications for this visit.    Allergies: Allergies  Allergen Reactions  . Imdur [Isosorbide Dinitrate] Other (See Comments)    headache  . Amoxicillin  Rash  . Tetracycline Rash    Social History: The patient  reports that she has never smoked. She has never used smokeless tobacco. She reports that she does not drink alcohol or use illicit drugs.   Family History: The patient's family history includes Heart disease in her father and mother; Heart failure in her father.   Review of Systems: Please see the history of present illness.   Otherwise, the review of systems is positive for leg swelling, DOE, rash - seen dermatology and irregular heart beats.   All other systems are reviewed and negative.   Physical Exam: VS:  BP 132/60 mmHg  Pulse 81  Ht 5' 4.5" (1.638 m)  Wt 208 lb (94.348 kg)  BMI 35.16 kg/m2 .  BMI Body mass index is 35.16 kg/(m^2). Her weight is down 9 pounds.  General: Pleasant. Well developed, well nourished and in no acute distress.  HEENT: Normal. Neck: Supple, no JVD, carotid bruits, or masses noted.  Cardiac: Regular rate and rhythm. Soft outflow murmur noted. No edema.  Respiratory:  Lungs are clear to auscultation bilaterally with normal work of breathing.  GI: Soft and nontender.  MS: No deformity or atrophy. Gait and ROM intact. Skin: Warm and dry. Color is normal.  Neuro:  Strength and sensation are intact and no gross focal deficits noted.  Psych: Alert, appropriate and with normal affect.   Wt Readings from Last 3 Encounters:  07/09/14 208 lb (94.348 kg)  03/01/14 217 lb 6.4 oz (98.612 kg)  01/14/14 216 lb 9.6 oz (98.249 kg)    LABORATORY DATA:  EKG:  EKG is not ordered today.   Lab Results  Component Value Date   WBC 10.6* 07/13/2013   HGB 11.5* 07/13/2013   HCT 34.0* 07/13/2013   PLT 210 07/13/2013   GLUCOSE 224* 04/05/2014   CHOL 122 05/05/2014   TRIG 163.0* 05/05/2014   HDL 31.90* 05/05/2014   LDLDIRECT 109.2 03/01/2014   LDLCALC 58 05/05/2014   ALT 13 05/05/2014   AST 22 05/05/2014   NA 138 04/05/2014   K  3.5 04/05/2014   CL 99 04/05/2014   CREATININE 1.5* 04/05/2014   BUN 23  04/05/2014   CO2 25 04/05/2014   TSH 1.466 06/15/2013   INR 1.06 06/15/2013   HGBA1C 9.9* 06/15/2013    BNP (last 3 results)  Recent Labs  07/30/13 0855 10/02/13 1013  PROBNP 119.0* 95.0    Other Studies Reviewed Today:   MYOVIEW FINDINGS ROM 06/2013: Normal resting EKG. Slight ST depressions noted in aVL after administration of lexiscan. Mild shortness of breath and nausea resolved after the test. EKG is nondiagnostic for ischemia. TID ratio 1.05. Lung-heart ratio 0.43. Normal ventricular chamber size.  IMPRESSION: No evidence for ischemia. Normal wall motion. LVEF 72%.  Electronically Signed  By: Guy Sandifer  On: 06/15/2013 14:27    Echo Study Conclusions from 02/2013  - Left ventricle: The cavity size was normal. Wall thickness was increased in a pattern of mild LVH. Systolic function was normal. The estimated ejection fraction was in the range of 50% to 55%. Wall motion was normal; there were no regional wall motion abnormalities. Left ventricular diastolic function parameters were normal. - Aortic valve: A bioprosthesis was present. There was very mild stenosis. - Mitral valve: Mild regurgitation. - Left atrium: The atrium was mildly dilated. - Right atrium: The atrium was mildly dilated. - Tricuspid valve: Moderate regurgitation. - Pulmonary arteries: PA peak pressure: 61mm Hg (S).  Assessment/Plan:  1. CAD  - no active symptoms. Stable Myoview from January 2015.  - Continue ASA 81, Plavix, statin, Toprol XL.    2. Atrial Arrhythmias/Palpitations Patient has not been on anticoagulation. She apparently had atrial fibrillation at some point in the past. It seems that the atrial fibrillation may have been post-op after redo CABG and has not recurred. She remains in sinus by exam today. Having more palpitations - increasing the Toprol to 75 mg BID. See Dr. Aundra Dubin back in March as planned. May have to repeat her event monitor.   3.  HYPERLIPIDEMIA  On statin therapy  4. Bioprosthetic aortic valve Well-seated on last echo.   5. Chronic diastolic CHF Near-euvolemic on current Lasix dosing.   7. CKD Check BMET today.    Current medicines are reviewed with the patient today.  The patient does not have concerns regarding medicines other than what was discussed today.  The following changes have been made:  Toprol increased to 75 mg BID  Labs/ tests ordered today include:   Orders Placed This Encounter  Procedures  . Basic metabolic panel  . CBC  . Magnesium  . TSH     Disposition:   FU with Dr. Aundra Dubin in 2 months as planned.  Patient is agreeable to this plan and will call if any problems develop in the interim.   Signed: [name], RN, ANP-C 07/09/2014 9:01 AM  [facility] [address] [place], [place]  [address] Phone: [phone] Fax: [phone]

## 2014-07-09 NOTE — Patient Instructions (Signed)
We will be checking the following labs today BMET, CBC, TSH, MG level  Stay on your current medicines but I am increasing the Toprol to a total of 75 MG twice a day - I sent a prescription for 25 mg to your drug store.  See Dr. Aundra Dubin in March as planned.  Call the [place] office at [phone] if you have any questions, problems or concerns.

## 2014-07-12 ENCOUNTER — Encounter: Payer: Self-pay | Admitting: Adult Health

## 2014-07-12 ENCOUNTER — Ambulatory Visit (INDEPENDENT_AMBULATORY_CARE_PROVIDER_SITE_OTHER): Payer: Medicare Other | Admitting: Adult Health

## 2014-07-12 VITALS — BP 106/64 | HR 81 | Temp 98.1°F | Ht 65.0 in | Wt 207.4 lb

## 2014-07-12 DIAGNOSIS — J4531 Mild persistent asthma with (acute) exacerbation: Secondary | ICD-10-CM

## 2014-07-12 MED ORDER — AZITHROMYCIN 250 MG PO TABS: ORAL_TABLET | ORAL | Status: AC

## 2014-07-12 NOTE — Patient Instructions (Addendum)
ZPack take as directed.  Mucinex DM Twice daily As needed  Cough/congestion  Tylenol As needed   Saline nasal rinses As needed   Please contact office for sooner follow up if symptoms do not improve or worsen or seek emergency care  Follow up Dr. Lamonte Sakai  As planned

## 2014-07-12 NOTE — Assessment & Plan Note (Signed)
Mild flare with URI/Bronchitis   Plan  ZPack take as directed.  Mucinex DM Twice daily As needed  Cough/congestion  Tylenol As needed   Saline nasal rinses As needed   Please contact office for sooner follow up if symptoms do not improve or worsen or seek emergency care  Follow up Dr. Lamonte Sakai  As planned

## 2014-07-12 NOTE — Progress Notes (Signed)
Subjective:    Patient ID: Hannah [name], female    DOB: 1946/03/08, 69 y.o.   MRN: [id number]   HPI 69 yo never smoker, hx of CAD/CABG, aortic valve replacement, A Fib. Also with hx of childhood asthma. She was diagnosed with adult asthma in her 35's in setting wheeze, SOB, cough that wouldn't go away. Was on prn SABA for a while, started on Symbicort early 2009. Had PFT's done in August 2009 and 2010 that showed mixed disease. In 10/10 FEV1 1.01L. Averages Prednisone about 3 - 4x a year. Never hospitalized or on MV due to breathing. Amiodarone stopped late 2010.   ROV 10/14/09 -- returns for f/u of her asthma, restrictive disease. Tells me that she has had PND, allergy symptoms this spring, making her mor SOB and giving her more cough.   ROV 03/07/10 -- Hx CAD/CABG, asthma and allergies. Still with some nasal congestion, doing loratadine and fluticasone spray, rarely [place]. Does some throat clearing. Taking her symbicort reliably. Uses ventolin a couple times a month.   ROV 10/04/10 -- Hx CAD/CABG, asthma and allergies. Has been having more trouble with nasal gtt and allergies and therefore more cough chest tightness. Using Symbicort. Has needed ventolin about 4x a day for last week. More SOB. Can't tolerate the NSWs.   ROV 04/24/11 -- Hx CAD/CABG, asthma and allergies. Here for follow up. She was treated over phone for bronchitis after URI - better after doxy and prednisone. Still on Symbicort bid. She is much improved, back to baseline. She has had the flu shot. She is rarely using SABA - was requiring frequently when she was exacerbated. She is having several AE a year.   ROV 02/22/12 -- Hx CAD/CABG, asthma and allergies. She is undergoing eval for HA, nausea, imbalance. May have had a small CVA - had MRI and neuro eval. Sometimes these episodes are associated with SOB. She remains on Symbicort.  She is on loratadine, uses nasal steroid prn.   ROV 09/09/12 -- Hx CAD/CABG, asthma and allergies. She is  starting to have nasal gtt and cough. She can't do [place]. Interested in changing loratadine to alternative. She doesn't like the nasal steroids because they run down her throat. Still on Symbicort.   ROV 03/17/13 -- Hx CAD/CABG, asthma and allergies.  Returns for f/u. She keeps a baseline cough. No asthma flares since last time. Remains on Symbicort, needs a spacer. Went back to loratadine. Not using nasal washes or steroids. She has had Flu Shot.   ROV 01/12/14 -- follows for asthma, allergies, cough. She tells me that she has been doing well, is on symbicort, rarely needs albuterol. Takes loratadine, not currently on nasal steroid. She had Prevnar-13 in Sept '14. No exacerbations!  07/12/2014 Acute OV : Asthma [name] /Cough  Patient presents for an acute office visit. Complains of  prod cough with green/brown mucus, wheezing, increased SOB, tightness esp w/ lying down x3 days. Denies f/c/s, n/v/d, hemoptysis, orthopnea, chest pain.  No recent travel or abx use.  No OTC used.   Review of Systems as per HPI    Objective:   Physical Exam  Gen: Pleasant, obese, in no distress,  normal affect  ENT: No lesions,  mouth clear,  oropharynx clear, no postnasal drip  Neck: No JVD, no TMG, no carotid bruits  Lungs: No use of accessory muscles, no dullness to percussion, few rhonchi   Cardiovascular: RRR, 1/6 SM , tr  peripheral edema  Musculoskeletal: No deformities, no cyanosis or  clubbing  Neuro: alert, non focal  Skin: Warm, no lesions or rashes   Assessment & Plan:

## 2014-07-14 ENCOUNTER — Telehealth: Payer: Self-pay | Admitting: *Deleted

## 2014-07-14 NOTE — Telephone Encounter (Signed)
-----   Message from [name], NP sent at 07/09/2014  3:32 PM EST ----- Ok to report. Labs are stable

## 2014-07-18 ENCOUNTER — Other Ambulatory Visit: Payer: Self-pay | Admitting: Cardiology

## 2014-08-09 ENCOUNTER — Other Ambulatory Visit: Payer: Self-pay | Admitting: Cardiology

## 2014-08-11 ENCOUNTER — Ambulatory Visit (INDEPENDENT_AMBULATORY_CARE_PROVIDER_SITE_OTHER): Payer: Medicare Other | Admitting: Cardiology

## 2014-08-11 ENCOUNTER — Encounter: Payer: Self-pay | Admitting: Cardiology

## 2014-08-11 VITALS — BP 124/58 | HR 63 | Ht 64.5 in | Wt 207.0 lb

## 2014-08-11 DIAGNOSIS — I5032 Chronic diastolic (congestive) heart failure: Secondary | ICD-10-CM

## 2014-08-11 DIAGNOSIS — I2510 Atherosclerotic heart disease of native coronary artery without angina pectoris: Secondary | ICD-10-CM

## 2014-08-11 DIAGNOSIS — I480 Paroxysmal atrial fibrillation: Secondary | ICD-10-CM

## 2014-08-11 DIAGNOSIS — I471 Supraventricular tachycardia: Secondary | ICD-10-CM

## 2014-08-11 DIAGNOSIS — I4719 Other supraventricular tachycardia: Secondary | ICD-10-CM

## 2014-08-11 NOTE — Patient Instructions (Addendum)
Your physician wants you to follow-up in: 6 MONTHS with DR [facility] (Sept 2016). You will receive a reminder letter in the mail two months in advance. If you don't receive a letter, please call our office to schedule the follow-up appointment.

## 2014-08-11 NOTE — Progress Notes (Signed)
Patient ID: Hannah [name], female   DOB: Jan 11, 1946, 69 y.o.   MRN: [id number] PCP: Dr. Suzy Bouchard  69 yo with history of CAD s/p CABG, bioprosthetic AVR, CKD, paroxysmal atrial fibrillation presents for cardiology followup.  She had her initial CABG in 1998, then had redo CABG in 2009 with bioprosthetic AVR.  She does not remember when she last had documented atrial fibrillation, but she thinks it was a number of years ago, sounds peri-operative around the time of her redo CABG.  She was never put on anticoagulation but has been on dronedarone.   In 9/14, she developed tachypalpitations and later chest pain.  In the ER, SBP was in the 200s with elevated HR. Troponin was mildly elevated.  Therefore, she had a cardiac cath showing patent grafts.  She was thought to have had demand ischemia in the setting of hypertensive emergency + tachycardia.  After discharge, she continued to have runs of tachypalpitations.  A 3 week event monitor was put on her.  This showed runs, sometimes long, of atrial tachycardia. She was started on Toprol XL when these runs were noted.  No further palpitations (complete resolution of symptoms).    In 1/15, she was admitted to [facility] with atypical chest pain.  She was ruled out for MI and Lexiscan Cardiolite showed no ischemia.  She was started on Imdur and sent home.  After discharge, she developed a daily headache and nausea.  She did not eat or drink much due to the nausea. Shortly after this, she was standing in her grandson's pediatrician's office and became severely lightheaded.  She had to sit down.  She came to the ER and was found to be orthostatic.  She was given IV fluid and got better quickly.  I had her stop Imdur and symptoms seem to have completely resolved.    She saw Truitt Merle in 1/16 with increased palpitations and Toprol XL was increased to 75 mg bid. Her palpitations have almost completely resolved with increase Toprol XL.  No chest pain.  She has chronic low  back pain that limits ambulation, but she is doing water aerobics 2 times a week. She has occasional wheezing from asthma.   Dyspnea walking up hills or up a flight of stairs. Weight is down 10 lbs.   Labs (2/13): BNP 80, K 5.3, creatinine 1.1 Labs (6/13): LDL 76, HDL 41 Labs (8/13): K 4.3, creatinine 1.38 Labs (9/13): creatinine 1.6, proBNP 128 Labs (9/14): K 4.7, creatinine 1.5, LDL 82, HDL 35 Labs (10/14): K 4.8, creatinine 1.5 Labs (1/15): LDL 105, HDL 37 Labs (2/15): K 4.1, creatinine 1.28, HCT 34 Labs (4/15): K 4.2, creatinine 1.5 => 1.4, BNP 95 Labs (1/16): K 3.8, creatinine 1.6, Hgb 10.5  PMH: 1. Diabetes mellitus 2. HTN 3. Obesity 4. Atrial arrhythmias: Paroxysmal atrial fibrillation seems to have been post-operative after CAD-AVR. She has a history of amiodarone toxicity. She has not been on coumadin.  3-week event monitor (9/13) with no atrial fibrillation.  3-week event monitor (10/14) with runs of probable atrial tachycardia.  5. CAD: CABG 1998 with LIMA-LAD/diagonal, sequential SVG-OM1/OM2, SVG-RCA.  Redo CABG 2009 with SVG-OM3, SVG-PDA.  Lexiscan myoview (9/13): EF 61%, no ischemia or infarction.  LHC (10/14) with all grafts patent except the old SVG-RCA.  There was 90% stenosis in the AV LCx before ungrafted MOM, but this was unchanged from prior study.  Lexiscan Cardiolite (1/15) with no ischemia. She does not tolerate Imdur.  6. CKD 7. Bioprosthetic aortic  valve: Echo (2011) with EF 50-55%, bioprosthetic aortic valve well-seated.  Echo (2/13) with EF 60-65%, bioprosthetic aortic valve looked ok.  Echo (9/14): EF 50-55%, mild LVH, bioprosthetic AVR with mean gradient 10, mild MR, moderate TR.  8. Asthma: since her teens. 9. Low back pain.  10. 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  Per neurologist, did not appear cardioembolic.   11. Carotid stenosis: 40-59% bilateral ICA stenosis on 10/14 carotid dopplers. Carotid doppler (11/15) with 40-59% bilateral ICA stenosis.   12. Sleep study 12/14 without significant OSA.  13. Chronic diastolic CHF  SH: Never smoked.  Lives in [place].  Married.    FH: No premature CAD.   ROS: All systems reviewed and negative except as per HPI.    Current Outpatient Prescriptions  Medication Sig Dispense Refill  . ACCU-CHEK AVIVA PLUS test strip as directed.    Marland Kitchen albuterol (PROVENTIL) (2.5 MG/3ML) 0.083% nebulizer solution Take 3 mLs (2.5 mg total) by nebulization every 6 (six) hours as needed for wheezing or shortness of breath. 180 mL 0  . ALPRAZolam (XANAX) 0.25 MG tablet Take 1 tablet (0.25 mg total) by mouth at bedtime as needed for sleep. 30 tablet 0  . amLODipine (NORVASC) 10 MG tablet TAKE ONE TABLET BY MOUTH ONCE DAILY 30 tablet 6  . aspirin EC 81 MG EC tablet Take 1 tablet (81 mg total) by mouth daily.    . budesonide-formoterol (SYMBICORT) 160-4.5 MCG/ACT inhaler Inhale 2 puffs into the lungs 2 (two) times daily. 1 Inhaler 6  . Cholecalciferol (VITAMIN D3) 2000 UNITS CHEW Chew by mouth daily.    . clopidogrel (PLAVIX) 75 MG tablet Take 75 mg by mouth daily.    . CRESTOR 40 MG tablet TAKE ONE TABLET BY MOUTH ONCE DAILY 30 tablet 9  . furosemide (LASIX) 40 MG tablet Take 0.5 tablets (20 mg total) by mouth 2 (two) times daily. 30 tablet 5  . insulin lispro (HUMALOG) 100 UNIT/ML injection Inject 10-20 Units into the skin See admin instructions. Per sliding scale 10-20 unit range 3 times daily    . JANUVIA 50 MG tablet Take 50 mg by mouth daily.     Marland Kitchen LANTUS SOLOSTAR 100 UNIT/ML Solostar Pen Inject 60 Units as directed at bedtime.    Marland Kitchen levocetirizine (XYZAL) 5 MG tablet Take 1 tablet by mouth daily.    . metoprolol succinate (TOPROL XL) 25 MG 24 hr tablet Take 1 tablet (25 mg total) by mouth 2 (two) times daily. 60 tablet 6  . metoprolol succinate (TOPROL XL) 50 MG 24 hr tablet Take 1 tablet (50 mg total) by mouth 2 (two) times daily. Take with or immediately following a meal. 60 tablet 6  . omeprazole (PRILOSEC) 40  MG capsule Take 40 mg by mouth 2 (two) times daily.    . potassium chloride SA (KLOR-CON M20) 20 MEQ tablet Take 1 tablet (20 mEq total) by mouth daily. 30 tablet 6  . traMADol (ULTRAM) 50 MG tablet Take 50 mg by mouth every 8 (eight) hours as needed for moderate pain.     . VENTOLIN HFA 108 (90 BASE) MCG/ACT inhaler INHALE TWO PUFFS EVERY 4 HOURS AS NEEDED 18 each 0  . ZETIA 10 MG tablet TAKE ONE TABLET BY MOUTH ONCE DAILY 30 tablet 3   No current facility-administered medications for this visit.    BP 124/58 mmHg  Pulse 63  Ht 5' 4.5" (1.638 m)  Wt 207 lb (93.895 kg)  BMI 35.00  kg/m2  SpO2 95% General: NAD Neck: Thick neck, JVP 7 cm, no thyromegaly or thyroid nodule.  Lungs: Clear to auscultation bilaterally with normal respiratory effort. CV: Nondisplaced PMI.  Heart regular S1/S2, no S3/S4, 1/6 SEM RUSB.  1+ edema 1/2 to knees bilaterally.  Bilateral carotid bruits.  Normal pedal pulses.  Abdomen: Soft, nontender, no hepatosplenomegaly, no distention.  Neurologic: Alert and oriented x 3.  Psych: Normal affect. Extremities: No clubbing or cyanosis.   Assessment/Plan: 1. CAD  Admission in 9/14 with mild troponin elevation.  LHC showed stable anatomy with patent grafts except for SVG-RCA from older CABG.  Suspect that the troponin elevation was due to demand ischemia in the setting of hypertensive emergency and tachycardia (possible run of atrial tachycardia).  Admission in 1/15 with atypical chest pain, had Lexiscan Cardiolite that showed no ischemia.  She has only rare atypical chest pain.  - Continue ASA 81, Plavix, statin, Toprol XL.      2. Atrial Arrhythmias Patient has not been on anticoagulation. She apparently had atrial fibrillation at some point in the past. It seems that the atrial fibrillation may have been post-op after redo CABG and has not recurred. She is in NSR today. She has occasional mild palpitations now, mostly resolved with increased Toprol XL. Event monitor in  the past suggested that these symptoms may be coming from atrial tachycardia.  Continue current Toprol XL.  If she has increased palpitations in the future, would place a 30 day monitor.  3. HYPERLIPIDEMIA  Good lipids on Crestor and Zetia.  4. Bioprosthetic aortic valve Well-seated on last echo in 2014.  5. Carotid bruit Repeat carotid dopplers in 11/16.   6. Chronic diastolic CHF Near-euvolemic on current Lasix dosing.   7. CKD Recent BMET was stable.   Followup in 6 months.   Loralie Champagne 08/11/2014

## 2014-10-12 ENCOUNTER — Encounter: Payer: Self-pay | Admitting: Internal Medicine

## 2014-10-12 ENCOUNTER — Ambulatory Visit (INDEPENDENT_AMBULATORY_CARE_PROVIDER_SITE_OTHER): Payer: Medicare Other | Admitting: Internal Medicine

## 2014-10-12 VITALS — BP 120/62 | HR 73 | Temp 98.5°F | Ht 64.5 in | Wt 211.8 lb

## 2014-10-12 DIAGNOSIS — R059 Cough, unspecified: Secondary | ICD-10-CM

## 2014-10-12 DIAGNOSIS — R05 Cough: Secondary | ICD-10-CM

## 2014-10-12 DIAGNOSIS — J4541 Moderate persistent asthma with (acute) exacerbation: Secondary | ICD-10-CM

## 2014-10-12 MED ORDER — METHYLPREDNISOLONE ACETATE 80 MG/ML IJ SUSP
120.0000 mg | Freq: Once | INTRAMUSCULAR | Status: AC
  Administered 2014-10-12: 120 mg via INTRAMUSCULAR

## 2014-10-12 NOTE — Assessment & Plan Note (Addendum)
Acute recurrent  Flares  Despite symbicort 160 2bid /    poor control of rhinitis  The proper method of use, as well as anticipated side effects, of a metered-dose inhaler are discussed and demonstrated to the patient. Improved effectiveness after extensive coaching during this visit to a level of approximately  75% s spacer > either needs to use spacer consistently with all hfa or not use it with any as otherwise she needs to master two different techniques, which is asking a bit much based on my time today with her  I had an extended discussion with the patient reviewing all relevant studies completed to date and  lasting 15 to 20 minutes of a 25 minute visit on the following ongoing concerns:  1) poor control of rhinitis > try change to zyrtec then consider add singulair  2) freq flares ? Due to allergy > depomedrol 120 today, consider allergy eval next with at least a RAST and Eos level  3) ? Acid (or non-acid) GERD > always difficult to exclude as up to 75% of pts in some series report no assoc GI/ Heartburn symptoms> rec max (24h)  acid suppression and diet restrictions/ reviewed and instructions given in writing.   4) Each maintenance medication was reviewed in detail including most importantly the difference between maintenance and as needed and under what circumstances the prns are to be used.  Please see instructions for details which were reviewed in writing and the patient given a copy.

## 2014-10-12 NOTE — Progress Notes (Signed)
Subjective:    Patient ID: Hannah [name], female    DOB: 13-Jun-1945, 69 y.o.   MRN: [id number]   HPI 69 yo never smoker, hx of CAD/CABG, aortic valve replacement, A Fib. Also with hx of childhood asthma. She was diagnosed with adult asthma in her 30's in setting wheeze, SOB, cough that wouldn't go away. Was on prn SABA for a while, started on Symbicort early 2009. Had PFT's done in August 2009 and 2010 that showed mixed disease. In 10/10 FEV1 1.01L. Averages Prednisone about 3 - 4x a year. Never hospitalized or on MV due to breathing. Amiodarone stopped late 2010.   ROV 10/14/09 -- returns for f/u of her asthma, restrictive disease. Tells me that she has had PND, allergy symptoms this spring, making her mor SOB and giving her more cough.   ROV 03/07/10 -- Hx CAD/CABG, asthma and allergies. Still with some nasal congestion, doing loratadine and fluticasone spray, rarely [place]. Does some throat clearing. Taking her symbicort reliably. Uses ventolin a couple times a month.   ROV 10/04/10 -- Hx CAD/CABG, asthma and allergies. Has been having more trouble with nasal gtt and allergies and therefore more cough chest tightness. Using Symbicort. Has needed ventolin about 4x a day for last week. More SOB. Can't tolerate the NSWs.   ROV 04/24/11 -- Hx CAD/CABG, asthma and allergies. Here for follow up. She was treated over phone for bronchitis after URI - better after doxy and prednisone. Still on Symbicort bid. She is much improved, back to baseline. She has had the flu shot. She is rarely using SABA - was requiring frequently when she was exacerbated. She is having several AE a year.   ROV 02/22/12 -- Hx CAD/CABG, asthma and allergies. She is undergoing eval for HA, nausea, imbalance. May have had a small CVA - had MRI and neuro eval. Sometimes these episodes are associated with SOB. She remains on Symbicort.  She is on loratadine, uses nasal steroid prn.   ROV 09/09/12 -- Hx CAD/CABG, asthma and allergies. She is  starting to have nasal gtt and cough. She can't do [place]. Interested in changing loratadine to alternative. She doesn't like the nasal steroids because they run down her throat. Still on Symbicort.   ROV 03/17/13 -- Hx CAD/CABG, asthma and allergies.  Returns for f/u. She keeps a baseline cough. No asthma flares since last time. Remains on Symbicort, needs a spacer. Went back to loratadine. Not using nasal washes or steroids. She has had Flu Shot.   ROV 01/12/14 -- follows for asthma, allergies, cough. She tells me that she has been doing well, is on symbicort, rarely needs albuterol. Takes loratadine, not currently on nasal steroid. She had Prevnar-13 in Sept '14. No exacerbations!  07/12/2014 Acute OV : Asthma Hannah [name] /Cough  Patient presents for an acute office visit. Complains of  prod cough with green/brown mucus, wheezing, increased SOB, tightness esp w/ lying down x3 days. Denies f/c/s, n/v/d, hemoptysis, orthopnea, chest pain.  No recent travel or abx use.  No OTC used.  rec ZPack take as directed.  Mucinex DM Twice daily As needed  Cough/congestion  Tylenol As needed   Saline nasal rinses As needed  rec    10/12/2014 f/u ov/Hannah [name] re: asthma flare on symbicort 160 2bid never tried singulair or zyrtec but baseline = no need for rescue on prilosec but not timed to meals Chief Complaint  Patient presents with  . Acute Visit    Pt c/o wheezing and cough x  4 days- cough has been prod since last night with minimal grey sputum. Chest feels tight. She is using albuterol inhaler 1 to 2 x per day on average and has not needed neb.   feels asthma is well controlled but  drippy nose is not on xyzal daily  Comfortable p saba despite poor hfa s spacer (see a/p)  No obvious day to day or daytime variabilty or assoc  cp or  subjective wheeze overt sinus or hb symptoms. No unusual exp hx or h/o childhood pna/ asthma or knowledge of premature birth.  Sleeping ok without nocturnal  or early am exacerbation  of  respiratory  c/o's or need for noct saba. Also denies any obvious fluctuation of symptoms with weather or environmental changes or other aggravating or alleviating factors except as outlined above   Current Medications, Allergies, Complete Past Medical History, Past Surgical History, Family History, and Social History were reviewed in [employer] record.  ROS  The following are not active complaints unless bolded sore throat, dysphagia, dental problems, itching, sneezing,  nasal congestion or excess/ purulent secretions, ear ache,   fever, chills, sweats, unintended wt loss, pleuritic or exertional cp, hemoptysis,  orthopnea pnd or leg swelling, presyncope, palpitations, heartburn, abdominal pain, anorexia, nausea, vomiting, diarrhea  or change in bowel or urinary habits, change in stools or urine, dysuria,hematuria,  rash, arthralgias, visual complaints, headache, numbness weakness or ataxia or problems with walking or coordination,  change in mood/affect or memory.         Objective:   Physical Exam  Wt Readings from Last 3 Encounters:  10/12/14 211 lb 12.8 oz (96.072 kg)  08/11/14 207 lb (93.895 kg)  07/12/14 207 lb 6.4 oz (94.076 kg)    Vital signs reviewed   Gen: Pleasant, obese, in no distress,  normal affect  ENT: No lesions,  mouth clear,  oropharynx clear, no postnasal drip  Neck: No JVD, no TMG, no carotid bruits  Lungs: No use of accessory muscles,  Trace bilateral end exp wheeze/ cough   Cardiovascular: RRR, 1/6 SM , tr  peripheral edema  Musculoskeletal: No deformities, no cyanosis or clubbing  Neuro: alert, non focal  Skin: Warm, no lesions or rashes   Assessment & Plan:

## 2014-10-12 NOTE — Patient Instructions (Addendum)
Try change xyzal to zyrtec 10 mg at bedtime  depomedrol 120 mg today   Work on inhaler technique:  relax and gently blow all the way out then take a nice smooth deep breath back in, triggering the inhaler at same time you start breathing in.  Hold for up to 5 seconds if you can.  Rinse and gargle with water when done   - blow out thru nose   Omeprazole 40 mg Take 30- 60 min before your first and last meals of the day   GERD (REFLUX)  is an extremely common cause of respiratory symptoms just like yours , many times with no obvious heartburn at all.    It can be treated with medication, but also with lifestyle changes including avoidance of late meals, excessive alcohol, smoking cessation, and avoid fatty foods, chocolate, peppermint, colas, red wine, and acidic juices such as orange juice.  NO MINT OR MENTHOL PRODUCTS SO NO COUGH DROPS  USE SUGARLESS CANDY INSTEAD (Jolley ranchers or Stover's or Life Savers) or even ice chips will also do - the key is to swallow to prevent all throat clearing. NO OIL BASED VITAMINS - use powdered substitutes.  Let us know if not doing better  by Friday the 6th  Late add consider add singulair/ do Eos and RAST if continues to flare

## 2014-10-14 ENCOUNTER — Telehealth: Payer: Self-pay | Admitting: Emergency Medicine

## 2014-10-14 MED ORDER — PREDNISONE 10 MG PO TABS: ORAL_TABLET | ORAL | Status: DC

## 2014-10-14 MED ORDER — AZITHROMYCIN 250 MG PO TABS: ORAL_TABLET | ORAL | Status: DC

## 2014-10-14 NOTE — Telephone Encounter (Signed)
[phone] pt cb, states she has not heard anything and called in this morning before 9am

## 2014-10-14 NOTE — Telephone Encounter (Signed)
Patient came in on Tuesday and saw Dr. Melvyn Novas.  Patient says he gave her a shot of Prednisone.  She is worse now.  Having trouble breathing, using nebulizer and using rescue inhaler more than usual. Coughing up green/gray mucus.  Chest feels very full and tight.  She gets a headache every time she coughs.  Severe fatigue, not able to sleep because of the cough and congestion, has been drinking hot tea to soothe her throat and sleeping upright in recliner.  Cough is very deep.

## 2014-10-14 NOTE — Telephone Encounter (Signed)
Please give her azithro z-pack, pred taper > Take 40mg  daily for 3 days, then 30mg  daily for 3 days, then 20mg  daily for 3 days, then 10mg  daily for 3 days, then stop

## 2014-10-14 NOTE — Telephone Encounter (Signed)
Pt aware of recs. RX sent in. Nothing further needed 

## 2014-10-14 NOTE — Telephone Encounter (Signed)
lmtcb x1 for pt to make aware we are awaiting response from [place]

## 2014-10-19 ENCOUNTER — Other Ambulatory Visit: Payer: Self-pay | Admitting: Cardiology

## 2014-10-22 ENCOUNTER — Ambulatory Visit (INDEPENDENT_AMBULATORY_CARE_PROVIDER_SITE_OTHER): Payer: Medicare Other | Admitting: Endocrinology

## 2014-10-22 ENCOUNTER — Encounter: Payer: Self-pay | Admitting: Endocrinology

## 2014-10-22 VITALS — BP 128/80 | HR 76 | Temp 98.0°F | Ht 64.5 in | Wt 215.0 lb

## 2014-10-22 DIAGNOSIS — E1142 Type 2 diabetes mellitus with diabetic polyneuropathy: Secondary | ICD-10-CM | POA: Diagnosis not present

## 2014-10-22 DIAGNOSIS — G629 Polyneuropathy, unspecified: Secondary | ICD-10-CM | POA: Diagnosis not present

## 2014-10-22 DIAGNOSIS — IMO0002 Reserved for concepts with insufficient information to code with codable children: Secondary | ICD-10-CM

## 2014-10-22 DIAGNOSIS — IMO0001 Reserved for inherently not codable concepts without codable children: Secondary | ICD-10-CM

## 2014-10-22 DIAGNOSIS — E1165 Type 2 diabetes mellitus with hyperglycemia: Secondary | ICD-10-CM

## 2014-10-22 MED ORDER — VICTOZA 18 MG/3ML ~~LOC~~ SOPN: 1.2000 mg | PEN_INJECTOR | Freq: Every day | SUBCUTANEOUS | Status: DC

## 2014-10-22 NOTE — Patient Instructions (Signed)
Humalog 12-14 at [facility]. [employer] after meal sugar <180  Lantus: may back off to 56 units if am sugar stays<90  Check blood sugars on waking up .Marland Kitchen4-5 .Marland Kitchen times a week Also check blood sugars about 2 hours after a meal and do this after different meals by rotation Recommended blood sugar levels on waking up is 90-130 and about 2 hours after meal is 140-180 Please bring blood sugar monitor to each visit.  Start VICTOZA injection as shown once daily at the same time of the day.  Dial the dose to 0.6 mg on the pen for the first week.  You may inject in the stomach, thigh or arm.  You may experience nausea in the first few days which usually goes away.  You will feel fullness of the stomach with starting the medication and should try to keep the portions at meals small. After 1 week increase the dose to 1.2mg  daily if no nausea present.   If any questions or concerns are present call the office or the [facility] helpline at [phone]. Visit [email or web address].info/ for more useful information  Stop Januvia

## 2014-10-22 NOTE — Progress Notes (Signed)
Patient ID: Hannah [name], female   DOB: Sep 07, 1945, 69 y.o.   MRN: [id number]           Reason for Appointment: Consultation for Type 2 Diabetes  Referring physician:  History of Present Illness:          Date of diagnosis of type 2 diabetes mellitus: Late 1980s        Background history:  She was previously treated with metformin prior to starting insulin She thinks she has been on insulin for at least 10 years and mostly taking Lantus and other types of insulin with it Her metformin was stopped when she started insulin Generally she has had difficulty controlling her diabetes  Recent history:   INSULIN regimen is described as: Lantus  60 hs Humalog 10 ac    She has been managed by her primary care physician and insulin doses have been adjusted periodically She has been taking Lantus at night, currently taking 60 units. She will check her blood sugar and take Humalog at least 10 units, will take a higher dose if the blood sugar is over 200 She thinks her A1c has been consistently over 8% and occasionally higher. She was apparently started on Januvia and 2015 and she thinks her blood sugars were somewhat better with this, may have had about 0.5% improvement in her A1c with this She has had difficulty losing weight in the past but over the last few months with exercise regimen and portion control as she has lost about 20 pounds.  Her maximum weight in the past has been 300  Current blood sugar patterns and problems identified:  Her blood sugars are mildly increased in the morning from her history  She thinks her blood sugars are a little higher at lunch and progressively higher later in the day with the highest readings at bedtime  She does not adjust her mealtime insulin based on what she is eating  She will sometimes even take Humalog at bedtime with the blood sugar is over 200     Oral hypoglycemic drugs the patient is taking are: Januvia 50 mg daily      Side effects from  medications have been: None  Compliance with the medical regimen: Fair Hypoglycemia: None    Glucose monitoring:  done about 3 times a day         Glucometer:  Accu-Chek Aviva Blood Glucose readings by recall  PREMEAL Breakfast Lunch Dinner Bedtime  Overall   Glucose range: 100-150  150   180   up to 220   Median:        Self-care: The diet that the patient has been following is: tries to limit fats. Green tea    Meal times: Breakfast: Lunch: Dinner:   Typical meal intake: Breakfast is eggs              Dietician visit, most recent:               Exercise: Water 3/7  Weight history:  Wt Readings from Last 3 Encounters:  10/22/14 215 lb (97.523 kg)  10/12/14 211 lb 12.8 oz (96.072 kg)  08/11/14 207 lb (93.895 kg)    Glycemic control: 8.9  Last creatinine was 1.5   Lab Results  Component Value Date   HGBA1C 9.9* 06/15/2013   HGBA1C * 04/01/2008    8.3 (NOTE)   The ADA recommends the following therapeutic goal for glycemic   control related to Hgb A1C measurement:   Goal  of Therapy:   < 7.0% Hgb A1C   Reference: American Diabetes Association: Clinical Practice   Recommendations 2008, Diabetes Care,  2008, 31:(Suppl 1).   Lab Results  Component Value Date   LDLCALC 58 05/05/2014   CREATININE 1.64* 07/09/2014         Medication List       This list is accurate as of: 10/22/14 11:59 PM.  Always use your most recent med list.               ACCU-CHEK AVIVA PLUS test strip  Generic drug:  glucose blood  as directed.     albuterol (2.5 MG/3ML) 0.083% nebulizer solution  Commonly known as:  PROVENTIL  Take 3 mLs (2.5 mg total) by nebulization every 6 (six) hours as needed for wheezing or shortness of breath.     VENTOLIN HFA 108 (90 BASE) MCG/ACT inhaler  Generic drug:  albuterol  INHALE TWO PUFFS EVERY 4 HOURS AS NEEDED     ALPRAZolam 0.25 MG tablet  Commonly known as:  XANAX  Take 1 tablet (0.25 mg total) by mouth at bedtime as needed for sleep.      amLODipine 10 MG tablet  Commonly known as:  NORVASC  TAKE ONE TABLET BY MOUTH ONCE DAILY     aspirin 81 MG EC tablet  Take 1 tablet (81 mg total) by mouth daily.     azithromycin 250 MG tablet  Commonly known as:  ZITHROMAX  Take as directed     budesonide-formoterol 160-4.5 MCG/ACT inhaler  Commonly known as:  SYMBICORT  Inhale 2 puffs into the lungs 2 (two) times daily.     clopidogrel 75 MG tablet  Commonly known as:  PLAVIX  Take 75 mg by mouth daily.     CRESTOR 40 MG tablet  Generic drug:  rosuvastatin  TAKE ONE TABLET BY MOUTH ONCE DAILY     furosemide 40 MG tablet  Commonly known as:  LASIX  Take 0.5 tablets (20 mg total) by mouth 2 (two) times daily.     insulin lispro 100 UNIT/ML injection  Commonly known as:  HUMALOG  Inject 10-20 Units into the skin See admin instructions. Per sliding scale 10-20 unit range 3 times daily     JANUVIA 50 MG tablet  Generic drug:  sitaGLIPtin  Take 50 mg by mouth daily.     LANTUS SOLOSTAR 100 UNIT/ML Solostar Pen  Generic drug:  Insulin Glargine  Inject 60 Units as directed at bedtime.     levocetirizine 5 MG tablet  Commonly known as:  XYZAL  Take 1 tablet by mouth daily.     metoprolol succinate 25 MG 24 hr tablet  Commonly known as:  TOPROL XL  Take 1 tablet (25 mg total) by mouth 2 (two) times daily.     metoprolol succinate 50 MG 24 hr tablet  Commonly known as:  TOPROL-XL  TAKE ONE TABLET BY MOUTH TWICE DAILY. TAKE  WITH  OR  IMMEDIATELY  FOLLOWING  A  MEAL     omeprazole 40 MG capsule  Commonly known as:  PRILOSEC  Take 40 mg by mouth 2 (two) times daily.     predniSONE 10 MG tablet  Commonly known as:  DELTASONE  Take 40mg  daily for 3 days, then 30mg  daily for 3 days, then 20mg  daily for 3 days, then 10mg  daily for 3 days, then stop     traMADol 50 MG tablet  Commonly known as:  ULTRAM  Take 50  mg by mouth every 8 (eight) hours as needed for moderate pain.     VICTOZA 18 MG/3ML Sopn  Generic drug:   Liraglutide  Inject 0.2 mLs (1.2 mg total) into the skin daily. Inject once daily at the same time     Vitamin D3 2000 UNITS Chew  Chew by mouth daily.     ZETIA 10 MG tablet  Generic drug:  ezetimibe  TAKE ONE TABLET BY MOUTH ONCE DAILY        Allergies:  Allergies  Allergen Reactions  . Imdur [Isosorbide Dinitrate] Other (See Comments)    headache  . Amoxicillin Rash  . Tetracycline Rash    Past Medical History  Diagnosis Date  . Hypertension   . Hyperlipidemia   . Atrial fibrillation     in sinus on Multaq  . CHF (congestive heart failure)     EF is low normal at 50 to 55% per echo in Jan. 2011  . Renal insufficiency, mild   . Dyspnea     chronic; followed by Dr. Lamonte Sakai  . Nonischemic cardiomyopathy   . Anemia   . Diabetes mellitus   . Iron deficiency anemia   . High risk medication use   . CAD (coronary artery disease)     Remote CABG in 1998 with redo in 2009  . S/P AVR 2009  . Amiodarone toxicity   . Hearing loss of right ear   . Stroke     Past Surgical History  Procedure Laterality Date  . Coronary artery bypass graft  1998 & 2009    Had LIMA to DX/LAD, SVG to 2 marginal branches and SVG to [facility] originally; SVG to 3rd OM and PD at time of redo  . Aortic valve replacement  2009    #25mm [facility] Ease pericardial valve  . Cardiac catheterization  03/23/2008    Pre-redo CABG: L main OK, LAD (T), CFX (T), OM1 99%, RCA (T), LIMA-LAD OK, SVG-OM(?3) OK w/ little florw to OM2, SVG-RCA OK. EF NL  . Left heart catheterization with coronary/graft angiogram N/A 02/23/2013    Procedure: LEFT HEART CATHETERIZATION WITH Beatrix Fetters;  Surgeon: Blane Ohara, MD;  Location: [facility] CATH LAB;  Service: Cardiovascular;  Laterality: N/A;    Family History  Problem Relation Age of Onset  . Heart disease Mother   . Heart disease Father   . Heart failure Father   . Diabetes Son   . Diabetes Maternal Grandmother     Social History:  reports that she has  never smoked. She has never used smokeless tobacco. She reports that she does not drink alcohol or use illicit drugs.    Review of Systems    Lipid history: On 40 mg Crestor along with Zetia since she has had her coronary artery disease diagnosis    Lab Results  Component Value Date   CHOL 122 05/05/2014   HDL 31.90* 05/05/2014   LDLCALC 58 05/05/2014   LDLDIRECT 109.2 03/01/2014   TRIG 163.0* 05/05/2014   CHOLHDL 4 05/05/2014           Constitutional: no recent weight gain but has lost some weight gradually, no complaints of unusual fatigue   Eyes: no history of blurred vision.  Most recent eye exam was 2016 and was not told to have any retinopathy  ENT: Occasional nasal congestion treated with antihistamines, no difficulty swallowing  Cardiovascular: no chest pain or tightness on exertion.   No leg swelling recently, previously given Lasix for  edema.   Hypertension: She has had good control with Lasix, amlodipine and metoprolol  Respiratory: no cough/shortness of breath, recent bonchitits  Gastrointestinal: no constipation, diarrhea, nausea or abdominal pain.  Takes Prilosec for reflux  Musculoskeletal: no muscle/joint aches. Back problems persist.  She is taking tramadol as needed   Urological:   No frequency of urination or nocturia  Skin: no rash or infections  Neurological: no headaches.  Has  numbness, burning, pains or tingling in feet   TIA? Balance  Psychiatric: no symptoms of depression, will take Xanax as needed for anxiety  Endocrine: No unusual fatigue, cold intolerance or history of thyroid disease TSH normal in 2016   LABS:  No visits with results within 1 Week(s) from this visit. Latest known visit with results is:  Office Visit on 07/09/2014  Component Date Value Ref Range Status  . Sodium 07/09/2014 140  135 - 145 mEq/L Final  . Potassium 07/09/2014 3.8  3.5 - 5.1 mEq/L Final  . Chloride 07/09/2014 101  96 - 112 mEq/L Final  . CO2  07/09/2014 30  19 - 32 mEq/L Final  . Glucose, Bld 07/09/2014 201* 70 - 99 mg/dL Final  . BUN 07/09/2014 22  6 - 23 mg/dL Final  . Creatinine, Ser 07/09/2014 1.64* 0.40 - 1.20 mg/dL Final  . Calcium 07/09/2014 9.4  8.4 - 10.5 mg/dL Final  . GFR 07/09/2014 33.03* >60.00 mL/min Final  . WBC 07/09/2014 9.8  4.0 - 10.5 K/uL Final  . RBC 07/09/2014 3.64* 3.87 - 5.11 Mil/uL Final  . Platelets 07/09/2014 203.0  150.0 - 400.0 K/uL Final  . Hemoglobin 07/09/2014 10.5* 12.0 - 15.0 g/dL Final  . HCT 07/09/2014 31.4* 36.0 - 46.0 % Final  . MCV 07/09/2014 86.2  78.0 - 100.0 fl Final  . MCHC 07/09/2014 33.5  30.0 - 36.0 g/dL Final  . RDW 07/09/2014 13.8  11.5 - 15.5 % Final  . Magnesium 07/09/2014 1.8  1.5 - 2.5 mg/dL Final  . TSH 07/09/2014 2.11  0.35 - 4.50 uIU/mL Final    Physical Examination:  BP 128/80 mmHg  Pulse 76  Temp(Src) 98 F (36.7 C) (Oral)  Ht 5' 4.5" (1.638 m)  Wt 215 lb (97.523 kg)  BMI 36.35 kg/m2  SpO2 90%  GENERAL:         Patient has generalized obesity.   HEENT:         Eye exam shows normal external appearance. Fundus exam shows no retinopathy. Oral exam shows normal mucosa .  NECK:   There is no lymphadenopathy Thyroid is not enlarged and no nodules felt.  Carotids are normal to palpation and no bruit heard LUNGS:         Chest is symmetrical. Lungs are clear to auscultation.Marland Kitchen   HEART:         Heart sounds:  S1 and S2 are normal. No murmurs or clicks heard., no S3 or S4.   ABDOMEN:   There is no distention present. Liver and spleen are not palpable. No other mass or tenderness present.   NEUROLOGICAL:   Vibration sense is absent in distal first left toe and minimal on the right. Ankle jerks are absent bilaterally.          Diabetic foot exam shows She has complete loss of monofilament sensation in her distal feet with only mild sensitivity present on the right first and second toes Pedal pulses are also decreased bilaterally MUSCULOSKELETAL:  There is no swelling or  deformity of  the peripheral joints. Spine is normal to inspection.   EXTREMITIES:     There is no edema. No skin lesions present.Marland Kitchen SKIN:       No rash or lesions of concern.        ASSESSMENT:  Diabetes type 2, uncontrolled   She has significant obesity and insulin resistance and currently A1c is still nearly 9% with taking about 80 units of insulin a day She is unable to take metformin because of renal dysfunction  See history of present illness for details of current management, blood sugar patterns and problems identified Has benefited only slightly with taking Januvia and addition to her insulin.  Overall she has been able to gradually lose weight with exercise regimen and small portions with her diet. However she still has post prandial hyperglycemia with the highest blood sugars after supper She is not entering her Humalog insulin to what she is eating and currently on a regimen only of a "sliding scale"  Her BASAL insulin is not likely to be lasting 24 hours with blood sugars higher in the evenings She is a good candidate for a GLP-1 drug Discussed with the patient the nature of GLP-1 drugs, the actions on various organ systems, how they benefit blood glucose control, as well as the benefit of weight loss and  increase satiety . Explained possible side effects especially nausea and vomiting initially; discussed safety information in package insert.  Described the injection technique and dosage titration starting with 0.6 mg, details in patient instructions  Complications: Significant peripheral neuropathy with sensory loss Unknown urine microalbumin level Macrovascular disease with significant history of coronary artery disease  HYPERLIPIDEMIA: Fairly well managed with Crestor and Zetia  PLAN:   Start Victoza as discussed in patient instructions with 0.6 mg as the initial dose  Discussed adjusting Lantus if morning sugars starts decreasing progressively after starting  Victoza  Stop Januvia  Increase Humalog to at least 12 units and most likely will need larger doses based on her carbohydrate intake and meal size.  She will check readings 2 hours after evening meal instead of at bedtime and target the sugar to be at least under 180 on most days  Consider consultations with diabetes nurse educator and dietitian, will be scheduled  Bring monitor on download on the next visit  Emphasized balanced meals with low carbohydrate and some protein at all meals  Will adjust her insulin further based on blood sugar patterns on her next visit  Discussed preventive measures for foot care because of her severe neuropathy with sensory loss  Discussed rotation of her injection sites to both sides of the abdomen and to move the injection sites up and down lateral abdomen and avoid the same site   Patient Instructions  Humalog 12-14 at [facility]. Target after meal sugar <180  Lantus: may back off to 56 units if am sugar stays<90  Check blood sugars on waking up .Marland Kitchen4-5 .Marland Kitchen times a week Also check blood sugars about 2 hours after a meal and do this after different meals by rotation Recommended blood sugar levels on waking up is 90-130 and about 2 hours after meal is 140-180 Please bring blood sugar monitor to each visit.  Start VICTOZA injection as shown once daily at the same time of the day.  Dial the dose to 0.6 mg on the pen for the first week.  You may inject in the stomach, thigh or arm.  You may experience nausea in the first few days which  usually goes away.  You will feel fullness of the stomach with starting the medication and should try to keep the portions at meals small. After 1 week increase the dose to 1.2mg  daily if no nausea present.   If any questions or concerns are present call the office or the [place] helpline at [phone]. Visit http://www.wall.info/ for more useful information  Stop Januvia     Counseling time  on subjects discussed above is over 50% of today's 60 minute visit   Palmyra Rogacki 10/24/2014, 9:24 PM   Note: This office note was prepared with Estate agent. Any transcriptional errors that result from this process are unintentional.

## 2014-10-29 ENCOUNTER — Encounter: Payer: Self-pay | Admitting: Dietician

## 2014-10-29 ENCOUNTER — Encounter: Payer: Medicare Other | Attending: Endocrinology | Admitting: Dietician

## 2014-10-29 VITALS — Ht 65.0 in | Wt 207.0 lb

## 2014-10-29 DIAGNOSIS — Z794 Long term (current) use of insulin: Secondary | ICD-10-CM | POA: Diagnosis not present

## 2014-10-29 DIAGNOSIS — E118 Type 2 diabetes mellitus with unspecified complications: Secondary | ICD-10-CM | POA: Insufficient documentation

## 2014-10-29 DIAGNOSIS — Z713 Dietary counseling and surveillance: Secondary | ICD-10-CM | POA: Diagnosis not present

## 2014-10-29 NOTE — Patient Instructions (Signed)
Continue the water exercise.  Aim for exercising 4-5 times per week.   Try armchair exercises and walking. Have regularly spaced meals. Aim for 2-3 Carb Choices per meal (j30-45 grams) +/- 1 either way  Aim for 0-1 Carbs per snack if hungry  Include protein in moderation with your meals and snacks Consider reading food labels for Total Carbohydrate, Sodium, and Fat Grams of foods

## 2014-10-29 NOTE — Progress Notes (Signed)
  Medical Nutrition Therapy:  Appt start time: 1300 end time:  [id number].   Assessment:  Primary concerns today: Patient is here alone.  She wishes to learn more about nutrition and lifestlye to improve blood sugar control.  "What do I eat when I am going out?  What is something quick and healthy for breakfast when in a rush."  States that the hardest thing about Diabetes is temptation of sweets.  CGB 73 this am but did not get up until 10:00 and is usually 100-120.  Checks before meals and bed time. After dinner 140 last night.   Hx includes type 2 diabetes for about 20 years, CABG x 5 in 1998, AVR in 2008, diabetic peripheral neuropathy, obesity, and CHF.  HgbA1C 8.9% 1 month ago per patient.  9.9% 06/15/13.  HDL 31.9, Trig 163 (05/05/14).  Weight of >300 lbs 10 years ago and has lost through healthy eating changes.  20 lbs loss since beginning the water aerobics in February.  States that she does not need to take her pain meds on days that she goes to the poo.    Patient lives with husband.  She does the shopping and cooking.  She works as a Solicitor 2 days a week.  Preferred Learning Style:   Auditory  Visual  Hands on.ndmd  No preference indicated   Learning Readiness:   Ready  Change in progress   MEDICATIONS: see list to include lantus each HS, humalog 10 units before each meal or sliding scale if >200, and Victoza   DIETARY INTAKE: 24-hr recall:  B (10:30 AM): plain cheerios with skim milk, cottage cheese Snk ( AM): none L ( PM): 1/2 [employer] grilled chicken salad Snk ( PM): occasional peanut butter and cracker/graham cracker or fruit D ( PM): chicken, sweet potato, vegetable or out to eat "diner" and orders a lot of vegetables.  Sometimes a dessert. Snk ( PM): cottage cheese and fruit or snack size popcorn Beverages: unsweetened decaf tea, diet soda (1), diet green tea  Usual physical activity: water aerobics 3 times per week at the [facility].  Estimated energy needs: 1400  calories 158 g carbohydrates 88 g protein 47 g fat  Progress Towards Goal(s):  In progress.   Nutritional Diagnosis:  NB-1.1 Food and nutrition-related knowledge deficit As related to balance of carbohydrate, protein, and fats.  As evidenced by diet hx.    Intervention:  Nutrition counseling and diabetes education initiated. Discussed Carb Counting by food group as method of portion control, reading food labels, and benefits of increased activity. Also discussed basic physiology of Diabetes, target BG ranges pre and post meals, and A1c. Discussed importance of low sodium choices and eating out implications as well.  Continue the water exercise.  Aim for exercising 4-5 times per week.   Try armchair exercises and walking. Have regularly spaced meals. Aim for 2-3 Carb Choices per meal (j30-45 grams) +/- 1 either way  Aim for 0-1 Carbs per snack if hungry  Include protein in moderation with your meals and snacks Consider reading food labels for Total Carbohydrate, Sodium, and Fat Grams of foods  Teaching Method Utilized:  Visual Auditory Hands on  Handouts given during visit include:  Meal plan card  Breakfast handout  Label reading  HgbA1C sheet  Snack list  Barriers to learning/adherence to lifestyle change: none  Demonstrated degree of understanding via:  Teach Back   Monitoring/Evaluation:  Dietary intake, exercise, label reading, and body weight prn  .

## 2014-11-11 IMAGING — CR DG CHEST 2V
2 series · 2 of 2 positions shown · non-contrast
Comparison: 05/12/2009.

CLINICAL DATA: Chest pain

EXAM:
CHEST  2 VIEW

[w chest pa]
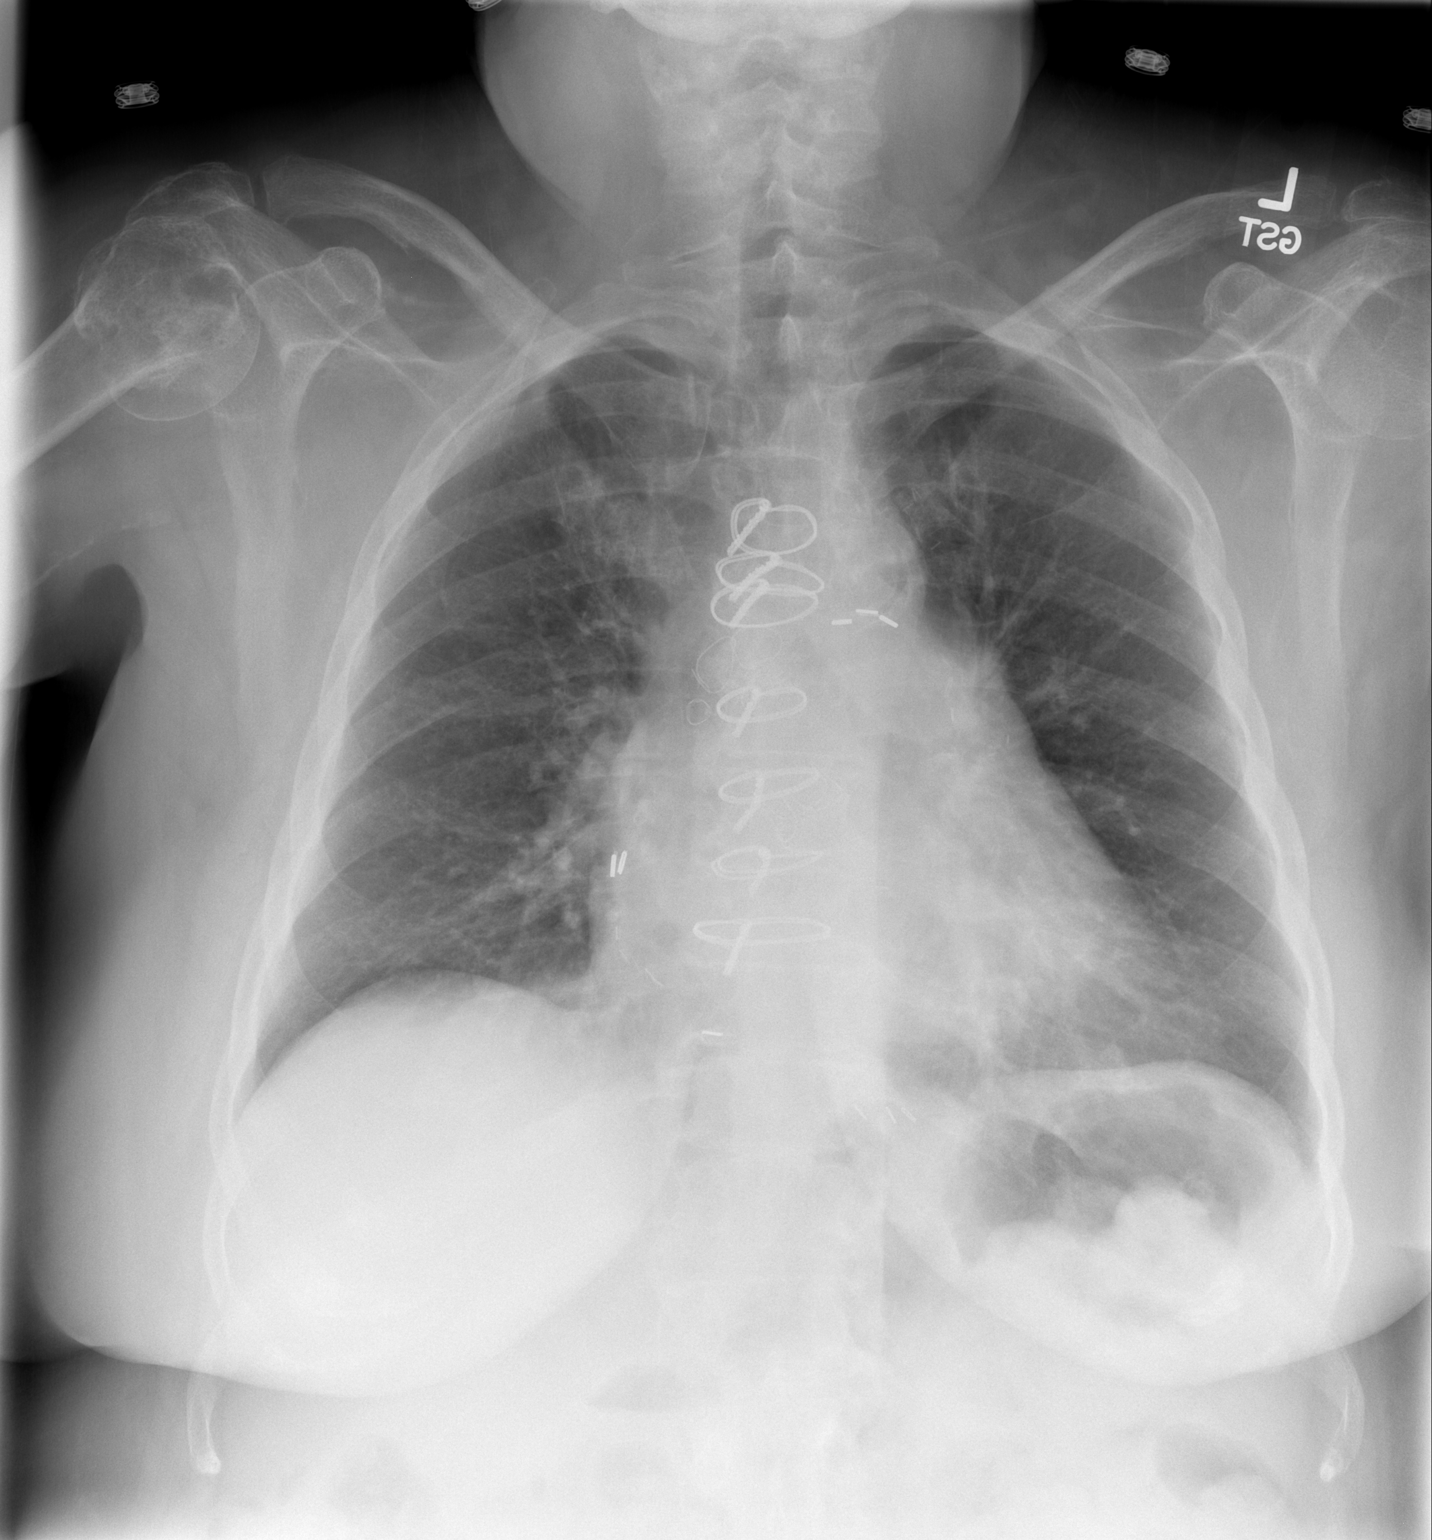

[w chest lat]
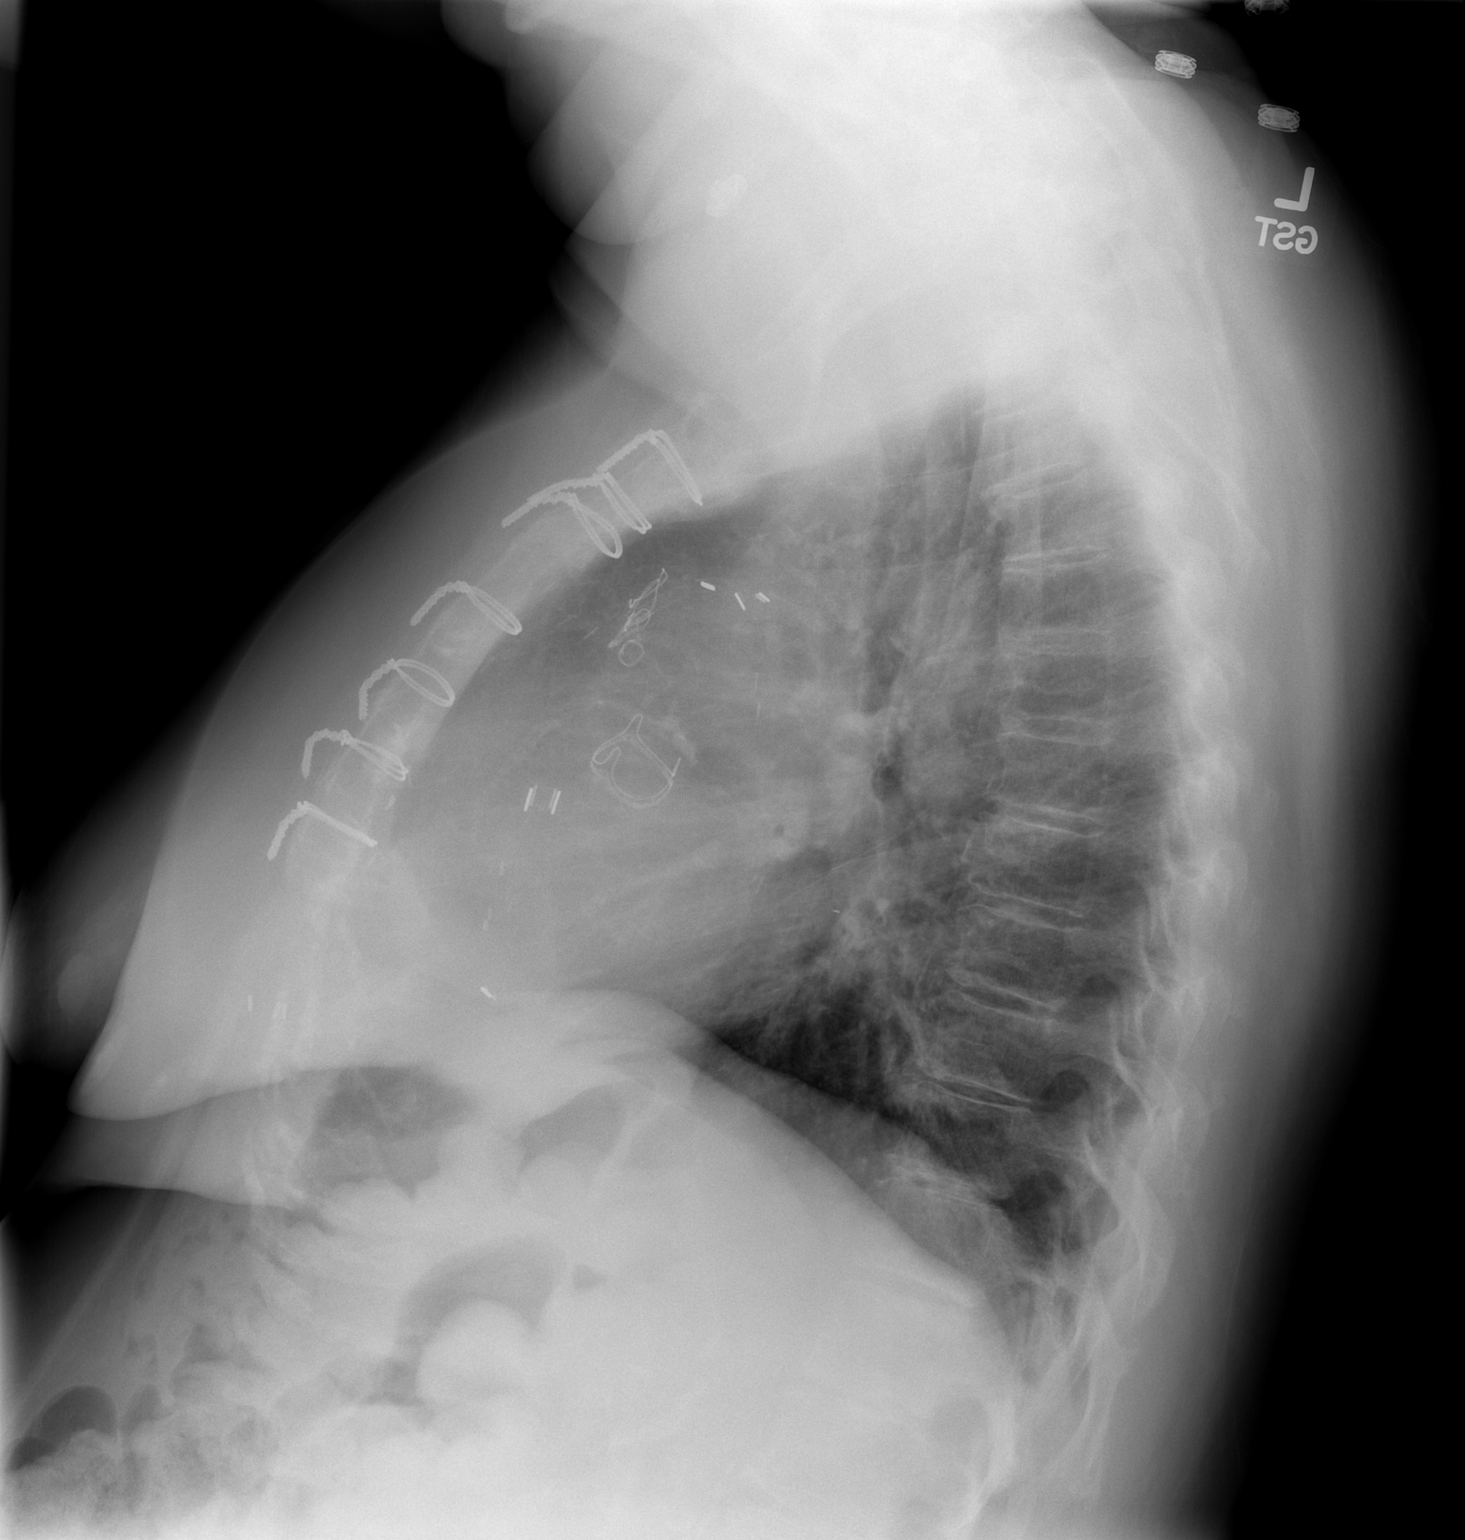

[2 of 2 positions shown; findings below may reference images not displayed]

FINDINGS: Stable cardiomegaly. Coronary artery atherosclerosis, status post
CABG. Aortic valvular replacement. No edema or effusion.

No pneumothorax. No focal infiltrate.

Chronic deformity of the right humeral neck status post fracture
03/23/2010.
IMPRESSION: 1. No active cardiopulmonary disease.
2. Prior CABG and aortic valve replacement.

## 2014-11-19 ENCOUNTER — Encounter: Payer: Self-pay | Admitting: Endocrinology

## 2014-11-19 ENCOUNTER — Ambulatory Visit (INDEPENDENT_AMBULATORY_CARE_PROVIDER_SITE_OTHER): Payer: Medicare Other | Admitting: Endocrinology

## 2014-11-19 VITALS — BP 126/64 | HR 88 | Temp 97.9°F | Resp 16 | Ht 64.5 in | Wt 203.4 lb

## 2014-11-19 DIAGNOSIS — E1165 Type 2 diabetes mellitus with hyperglycemia: Secondary | ICD-10-CM

## 2014-11-19 DIAGNOSIS — E785 Hyperlipidemia, unspecified: Secondary | ICD-10-CM | POA: Diagnosis not present

## 2014-11-19 DIAGNOSIS — IMO0002 Reserved for concepts with insufficient information to code with codable children: Secondary | ICD-10-CM

## 2014-11-19 NOTE — Patient Instructions (Signed)
Victoza 5 clicks above 0.6 mark  Lantus 56 units and reduce to 52 if am sugar <90 for 2-3 days  Supper Humalog 14 units  Check blood sugars on waking up .Marland Kitchen3-4  .Marland Kitchen times a week Also check blood sugars about 2 hours after a meal and do this after different meals by rotation Recommended blood sugar levels on waking up is 90-130 and about 2 hours after meal is 140-180 Please bring blood sugar monitor to each visit.

## 2014-11-19 NOTE — Progress Notes (Signed)
Patient ID: Hannah [name], female   DOB: Mar 04, 1946, 69 y.o.   MRN: [id number]           Reason for Appointment: Follow-up for Type 2 Diabetes  Referring physician: Suzy Bouchard  History of Present Illness:          Date of diagnosis of type 2 diabetes mellitus: Late 1980s        Background history:  She was previously treated with metformin prior to starting insulin She thinks she has been on insulin for at least 10 years and mostly taking Lantus and other types of insulin with it Her metformin was stopped when she started insulin and also has had renal dysfunction Generally she has had difficulty controlling her diabetes  Recent history:   INSULIN regimen is described as: Lantus 60 hs Humalog 10-12 ac meals     She thinks her A1c has been consistently over 8% and occasionally higher. Because of poor control she was switched from Januvia to [place] on her initial consultation in 5/16 Although she has not been able to increase the dose of Victoza to 1.2 mg she appears to have responded well to 0.6 mg and has been progressively losing weight also She did not increase her dose to 1.2 mg because she was having persistent nausea until 2 days ago She has also seen the dietitian recently Insulin doses have not been changed as yet She was supposed to increase her suppertime Humalog to at least 12 units but is still taking 10-12 Her blood sugars are assessed by recall only as she did not bring her monitor  Current blood sugar patterns and problems identified:  Her blood sugars are excellent in the morning without overnight hypoglycemia  She still has relatively high readings after supper/at bedtime but does not remember exactly  She thinks blood sugars are better around lunch and supper but not clear how often she is checking them  She does not adjust her mealtime insulin based on what she is eating  She is improving her diet after discussion with dietitian    Oral hypoglycemic drugs the  patient is taking are: None Side effects from medications have been: None  Compliance with the medical regimen: Fair Hypoglycemia: None    Glucose monitoring:  done about 3 times a day         Glucometer:  Accu-Chek [employer] Blood Glucose readings by recall   PRE-MEAL Fasting Lunch Dinner Bedtime Overall  Glucose range: <120   150-180   Mean/median:         Self-care: The diet that the patient has been following is: tries to limit fats.  uses diet green tea and no sugar in drinks   Typical meal intake: Breakfast is eggs for protein               Dietician visit, most recent: 5/16               Exercise: Water exercises 3/7 days  Weight history:  Wt Readings from Last 3 Encounters:  11/19/14 203 lb 6.4 oz (92.262 kg)  10/29/14 207 lb (93.895 kg)  10/22/14 215 lb (97.523 kg)    Glycemic control: Last A1c 8.9 from PCP  Last creatinine was 1.5   Lab Results  Component Value Date   HGBA1C 9.9* 06/15/2013   HGBA1C * 04/01/2008    8.3 (NOTE)   The ADA recommends the following therapeutic goal for glycemic   control related to Hgb A1C measurement:   Goal  of Therapy:   < 7.0% Hgb A1C   Reference: American Diabetes Association: Clinical Practice   Recommendations 2008, Diabetes Care,  2008, 31:(Suppl 1).   Lab Results  Component Value Date   LDLCALC 58 05/05/2014   CREATININE 1.64* 07/09/2014         Medication List       This list is accurate as of: 11/19/14 11:59 PM.  Always use your most recent med list.               ACCU-CHEK AVIVA PLUS test strip  Generic drug:  glucose blood  as directed.     albuterol (2.5 MG/3ML) 0.083% nebulizer solution  Commonly known as:  PROVENTIL  Take 3 mLs (2.5 mg total) by nebulization every 6 (six) hours as needed for wheezing or shortness of breath.     VENTOLIN HFA 108 (90 BASE) MCG/ACT inhaler  Generic drug:  albuterol  INHALE TWO PUFFS EVERY 4 HOURS AS NEEDED     ALPRAZolam 0.25 MG tablet  Commonly known as:  XANAX    Take 1 tablet (0.25 mg total) by mouth at bedtime as needed for sleep.     amLODipine 10 MG tablet  Commonly known as:  NORVASC  TAKE ONE TABLET BY MOUTH ONCE DAILY     aspirin 81 MG EC tablet  Take 1 tablet (81 mg total) by mouth daily.     azithromycin 250 MG tablet  Commonly known as:  ZITHROMAX  Take as directed     budesonide-formoterol 160-4.5 MCG/ACT inhaler  Commonly known as:  SYMBICORT  Inhale 2 puffs into the lungs 2 (two) times daily.     clopidogrel 75 MG tablet  Commonly known as:  PLAVIX  Take 75 mg by mouth daily.     CRESTOR 40 MG tablet  Generic drug:  rosuvastatin  TAKE ONE TABLET BY MOUTH ONCE DAILY     furosemide 40 MG tablet  Commonly known as:  LASIX  Take 0.5 tablets (20 mg total) by mouth 2 (two) times daily.     insulin lispro 100 UNIT/ML injection  Commonly known as:  HUMALOG  Inject 10-20 Units into the skin See admin instructions. Per sliding scale 10-20 unit range 3 times daily     JANUVIA 50 MG tablet  Generic drug:  sitaGLIPtin  Take 50 mg by mouth daily.     LANTUS SOLOSTAR 100 UNIT/ML Solostar Pen  Generic drug:  Insulin Glargine  Inject 60 Units as directed at bedtime.     levocetirizine 5 MG tablet  Commonly known as:  XYZAL  Take 1 tablet by mouth daily.     metoprolol succinate 25 MG 24 hr tablet  Commonly known as:  TOPROL XL  Take 1 tablet (25 mg total) by mouth 2 (two) times daily.     metoprolol succinate 50 MG 24 hr tablet  Commonly known as:  TOPROL-XL  TAKE ONE TABLET BY MOUTH TWICE DAILY. TAKE  WITH  OR  IMMEDIATELY  FOLLOWING  A  MEAL     omeprazole 40 MG capsule  Commonly known as:  PRILOSEC  Take 40 mg by mouth 2 (two) times daily.     traMADol 50 MG tablet  Commonly known as:  ULTRAM  Take 50 mg by mouth every 8 (eight) hours as needed for moderate pain.     VICTOZA 18 MG/3ML Sopn  Generic drug:  Liraglutide  Inject 0.2 mLs (1.2 mg total) into the skin daily. Inject once daily at  the same time      Vitamin D3 2000 UNITS Chew  Chew by mouth daily.     ZETIA 10 MG tablet  Generic drug:  ezetimibe  TAKE ONE TABLET BY MOUTH ONCE DAILY        Allergies:  Allergies  Allergen Reactions  . Imdur [Isosorbide Dinitrate] Other (See Comments)    headache  . Amoxicillin Rash  . Tetracycline Rash    Past Medical History  Diagnosis Date  . Hypertension   . Hyperlipidemia   . Atrial fibrillation     in sinus on Multaq  . CHF (congestive heart failure)     EF is low normal at 50 to 55% per echo in Jan. 2011  . Renal insufficiency, mild   . Dyspnea     chronic; followed by Dr. Lamonte Sakai  . Nonischemic cardiomyopathy   . Anemia   . Diabetes mellitus   . Iron deficiency anemia   . High risk medication use   . CAD (coronary artery disease)     Remote CABG in 1998 with redo in 2009  . S/P AVR 2009  . Amiodarone toxicity   . Hearing loss of right ear   . Stroke     Past Surgical History  Procedure Laterality Date  . Coronary artery bypass graft  1998 & 2009    Had LIMA to DX/LAD, SVG to 2 marginal branches and SVG to [facility] originally; SVG to 3rd OM and PD at time of redo  . Aortic valve replacement  2009    #17mm [facility] Ease pericardial valve  . Cardiac catheterization  03/23/2008    Pre-redo CABG: L main OK, LAD (T), CFX (T), OM1 99%, RCA (T), LIMA-LAD OK, SVG-OM(?3) OK w/ little florw to OM2, SVG-RCA OK. EF NL  . Left heart catheterization with coronary/graft angiogram N/A 02/23/2013    Procedure: LEFT HEART CATHETERIZATION WITH Beatrix Fetters;  Surgeon: Blane Ohara, MD;  Location: [facility] CATH LAB;  Service: Cardiovascular;  Laterality: N/A;    Family History  Problem Relation Age of Onset  . Heart disease Mother   . Heart disease Father   . Heart failure Father   . Diabetes Son   . Diabetes Maternal Grandmother     Social History:  reports that she has never smoked. She has never used smokeless tobacco. She reports that she does not drink alcohol or use  illicit drugs.    Review of Systems    Lipid history: On 40 mg Crestor along with Zetia since she has had her coronary artery disease diagnosis    Lab Results  Component Value Date   CHOL 122 05/05/2014   HDL 31.90* 05/05/2014   LDLCALC 58 05/05/2014   LDLDIRECT 109.2 03/01/2014   TRIG 163.0* 05/05/2014   CHOLHDL 4 05/05/2014           Eyes:  Most recent eye exam was 2016 and was not told to have any retinopathy  Hypertension: She has had good control with Lasix, amlodipine and metoprolol   Diabetic foot exam in 5/16 shows She has complete loss of monofilament sensation in her distal feet with only mild sensitivity present on the right first and second toes  Physical Examination:  BP 126/64 mmHg  Pulse 88  Temp(Src) 97.9 F (36.6 C)  Resp 16  Ht 5' 4.5" (1.638 m)  Wt 203 lb 6.4 oz (92.262 kg)  BMI 34.39 kg/m2  SpO2 94%     ASSESSMENT:  Diabetes type 2, uncontrolled  She has significant obesity and insulin resistance Now taking basal bolus insulin regimen along with Victoza 0.6 mg With this her blood sugars appear to be significantly better and she has lost weight Also has been improving her diet with consultation with the dietitian Her highest blood sugars appear to be after supper by recall  PLAN:   Start increasing Victoza by at least 5 clicks beyond 0.6 and if tolerated 1.2 mg  Increase suppertime dose by 2 units  Reduce Lantus by 4 units and may adjust further if fasting blood sugars are out of range; blood sugar targets discussed  She needs to bring her monitor for review on each visit  Discussed adjusting Humalog based on meal size and try to keep postprandial readings around 150  Continue regular exercise  Need to check A1c in follow-up  Patient Instructions  Victoza 5 clicks above 0.6 mark  Lantus 56 units and reduce to 52 if am sugar <90 for 2-3 days  Supper Humalog 14 units  Check blood sugars on waking up .Marland Kitchen3-4  .Marland Kitchen times a week Also  check blood sugars about 2 hours after a meal and do this after different meals by rotation Recommended blood sugar levels on waking up is 90-130 and about 2 hours after meal is 140-180 Please bring blood sugar monitor to each visit.     Michall Noffke 11/20/2014, 9:19 PM   Note: This office note was prepared with Dragon voice recognition system technology. Any transcriptional errors that result from this process are unintentional.

## 2014-12-06 ENCOUNTER — Encounter (HOSPITAL_BASED_OUTPATIENT_CLINIC_OR_DEPARTMENT_OTHER): Payer: Self-pay | Admitting: *Deleted

## 2014-12-06 ENCOUNTER — Emergency Department (HOSPITAL_BASED_OUTPATIENT_CLINIC_OR_DEPARTMENT_OTHER)
Admission: EM | Admit: 2014-12-06 | Discharge: 2014-12-07 | Disposition: A | Discharge status: Home / Self Care | Payer: Medicare Other | Attending: Emergency Medicine | Admitting: Emergency Medicine

## 2014-12-06 DIAGNOSIS — Z8673 Personal history of transient ischemic attack (TIA), and cerebral infarction without residual deficits: Secondary | ICD-10-CM | POA: Insufficient documentation

## 2014-12-06 DIAGNOSIS — E119 Type 2 diabetes mellitus without complications: Secondary | ICD-10-CM | POA: Insufficient documentation

## 2014-12-06 DIAGNOSIS — Z79899 Other long term (current) drug therapy: Secondary | ICD-10-CM | POA: Insufficient documentation

## 2014-12-06 DIAGNOSIS — N200 Calculus of kidney: Secondary | ICD-10-CM

## 2014-12-06 DIAGNOSIS — H9191 Unspecified hearing loss, right ear: Secondary | ICD-10-CM | POA: Insufficient documentation

## 2014-12-06 DIAGNOSIS — I2510 Atherosclerotic heart disease of native coronary artery without angina pectoris: Secondary | ICD-10-CM | POA: Insufficient documentation

## 2014-12-06 DIAGNOSIS — Z87448 Personal history of other diseases of urinary system: Secondary | ICD-10-CM | POA: Diagnosis not present

## 2014-12-06 DIAGNOSIS — R1013 Epigastric pain: Secondary | ICD-10-CM | POA: Diagnosis present

## 2014-12-06 DIAGNOSIS — I10 Essential (primary) hypertension: Secondary | ICD-10-CM | POA: Diagnosis not present

## 2014-12-06 DIAGNOSIS — Z951 Presence of aortocoronary bypass graft: Secondary | ICD-10-CM | POA: Insufficient documentation

## 2014-12-06 DIAGNOSIS — R52 Pain, unspecified: Secondary | ICD-10-CM

## 2014-12-06 DIAGNOSIS — Z7901 Long term (current) use of anticoagulants: Secondary | ICD-10-CM | POA: Insufficient documentation

## 2014-12-06 DIAGNOSIS — Z954 Presence of other heart-valve replacement: Secondary | ICD-10-CM | POA: Insufficient documentation

## 2014-12-06 DIAGNOSIS — I4891 Unspecified atrial fibrillation: Secondary | ICD-10-CM | POA: Insufficient documentation

## 2014-12-06 DIAGNOSIS — Z880 Allergy status to penicillin: Secondary | ICD-10-CM | POA: Insufficient documentation

## 2014-12-06 DIAGNOSIS — Z7982 Long term (current) use of aspirin: Secondary | ICD-10-CM | POA: Diagnosis not present

## 2014-12-06 DIAGNOSIS — Z7951 Long term (current) use of inhaled steroids: Secondary | ICD-10-CM | POA: Insufficient documentation

## 2014-12-06 DIAGNOSIS — Z794 Long term (current) use of insulin: Secondary | ICD-10-CM | POA: Diagnosis not present

## 2014-12-06 DIAGNOSIS — Z9889 Other specified postprocedural states: Secondary | ICD-10-CM | POA: Diagnosis not present

## 2014-12-06 DIAGNOSIS — Z862 Personal history of diseases of the blood and blood-forming organs and certain disorders involving the immune mechanism: Secondary | ICD-10-CM | POA: Insufficient documentation

## 2014-12-06 DIAGNOSIS — R011 Cardiac murmur, unspecified: Secondary | ICD-10-CM | POA: Diagnosis not present

## 2014-12-06 DIAGNOSIS — I509 Heart failure, unspecified: Secondary | ICD-10-CM | POA: Insufficient documentation

## 2014-12-06 LAB — CBC WITH DIFFERENTIAL/PLATELET
Basophils Absolute: 0.1 10*3/uL (ref 0.0–0.1)
Basophils Relative: 1 % (ref 0–1)
Eosinophils Absolute: 0.5 10*3/uL (ref 0.0–0.7)
Eosinophils Relative: 5 % (ref 0–5)
HCT: 36.1 % (ref 36.0–46.0)
Hemoglobin: 11.9 g/dL — ABNORMAL LOW (ref 12.0–15.0)
Lymphocytes Relative: 20 % (ref 12–46)
Lymphs Abs: 1.9 10*3/uL (ref 0.7–4.0)
MCH: 28.7 pg (ref 26.0–34.0)
MCHC: 33 g/dL (ref 30.0–36.0)
MCV: 87.2 fL (ref 78.0–100.0)
Monocytes Absolute: 1 10*3/uL (ref 0.1–1.0)
Monocytes Relative: 10 % (ref 3–12)
Neutro Abs: 6.2 10*3/uL (ref 1.7–7.7)
Neutrophils Relative %: 64 % (ref 43–77)
Platelets: 185 10*3/uL (ref 150–400)
RBC: 4.14 MIL/uL (ref 3.87–5.11)
RDW: 13.1 % (ref 11.5–15.5)
WBC: 9.6 10*3/uL (ref 4.0–10.5)

## 2014-12-06 MED ORDER — GI COCKTAIL ~~LOC~~
30.0000 mL | Freq: Once | ORAL | Status: AC
  Administered 2014-12-07: 30 mL via ORAL
  Filled 2014-12-06: qty 30

## 2014-12-06 MED ORDER — FENTANYL CITRATE (PF) 100 MCG/2ML IJ SOLN
50.0000 ug | Freq: Once | INTRAMUSCULAR | Status: AC
  Administered 2014-12-07: 50 ug via INTRAVENOUS
  Filled 2014-12-06: qty 2

## 2014-12-06 MED ORDER — ONDANSETRON HCL 4 MG/2ML IJ SOLN
4.0000 mg | Freq: Once | INTRAMUSCULAR | Status: AC
Start: 2014-12-07 — End: 2014-12-07
  Administered 2014-12-07: 4 mg via INTRAVENOUS
  Filled 2014-12-06: qty 2

## 2014-12-06 NOTE — ED Notes (Signed)
Pt c/o abd pain with n/v/d x 1 hr

## 2014-12-06 NOTE — ED Provider Notes (Signed)
CSN: [id number]     Arrival date & time 12/06/14  2319 History  This chart was scribed for  Hannah Stroebel, MD by Altamease Oiler, ED Scribe. This patient was seen in room MH02/MH02 and the patient's care was started at 11:35 PM.    Chief Complaint  Patient presents with  . Abdominal Pain    Patient is a 69 y.o. female presenting with abdominal pain. The history is provided by the patient and a relative. No language interpreter was used.  Abdominal Pain Pain location:  Epigastric Pain quality: sharp   Pain radiates to:  Does not radiate Pain severity:  Severe Duration:  2 hours Timing:  Constant Chronicity:  New Context: suspicious food intake   Relieved by:  Nothing Worsened by:  Nothing tried Ineffective treatments:  Bowel activity and vomiting Associated symptoms: nausea and vomiting   Associated symptoms: no chills, no constipation, no diarrhea, no fever and no hematochezia   Nausea:    Severity:  Moderate   Onset quality:  Gradual   Duration:  4 hours   Timing:  Constant   Progression:  Worsening Vomiting:    Quality:  Stomach contents   Number of occurrences:  2   Severity:  Mild Risk factors: being elderly    Hannah [name] is a 69 y.o. female with PMHx of CAD s/p CABG, HTN, DM, HLD, a-fib, and CHF who presents to the Emergency Department complaining of constant epigastric pain with onset around 10 PM tonight. The pain is described as sharp and rated 10/10 in severity.  Associated symptoms include nausea since around 7:30 PM, light headedness, generalized weakness, and 2 episodes of emesis. Pt denies hematochezia and constipation. She had 2 soft bowel movements today.Earlier in the day the pt ate a roast beef sandwich with bacon from [employer]. Her family ate at the same restaurant and no one else is ill.  No history of  abdominal surgery.   Past Medical History  Diagnosis Date  . Hypertension   . Hyperlipidemia   . Atrial fibrillation     in sinus on Multaq  . CHF  (congestive heart failure)     EF is low normal at 50 to 55% per echo in Jan. 2011  . Renal insufficiency, mild   . Dyspnea     chronic; followed by Dr. Lamonte Sakai  . Nonischemic cardiomyopathy   . Anemia   . Diabetes mellitus   . Iron deficiency anemia   . High risk medication use   . CAD (coronary artery disease)     Remote CABG in 1998 with redo in 2009  . S/P AVR 2009  . Amiodarone toxicity   . Hearing loss of right ear   . Stroke    Past Surgical History  Procedure Laterality Date  . Coronary artery bypass graft  1998 & 2009    Had LIMA to DX/LAD, SVG to 2 marginal branches and SVG to [facility] originally; SVG to 3rd OM and PD at time of redo  . Aortic valve replacement  2009    #40mm [facility] Ease pericardial valve  . Cardiac catheterization  03/23/2008    Pre-redo CABG: L main OK, LAD (T), CFX (T), OM1 99%, RCA (T), LIMA-LAD OK, SVG-OM(?3) OK w/ little florw to OM2, SVG-RCA OK. EF NL  . Left heart catheterization with coronary/graft angiogram N/A 02/23/2013    Procedure: LEFT HEART CATHETERIZATION WITH Beatrix Fetters;  Surgeon: Blane Ohara, MD;  Location: [facility] CATH LAB;  Service: Cardiovascular;  Laterality: N/A;   Family History  Problem Relation Age of Onset  . Heart disease Mother   . Heart disease Father   . Heart failure Father   . Diabetes Son   . Diabetes Maternal Grandmother    History  Substance Use Topics  . Smoking status: Never Smoker   . Smokeless tobacco: Never Used  . Alcohol Use: No   OB History    No data available     Review of Systems  Constitutional: Negative for fever and chills.  Gastrointestinal: Positive for nausea, vomiting and abdominal pain. Negative for diarrhea, constipation, blood in stool and hematochezia.  All other systems reviewed and are negative.     Allergies  Imdur; Amoxicillin; and Tetracycline  Home Medications   Prior to Admission medications   Medication Sig Start Date End Date Taking? Authorizing  Provider  ACCU-CHEK AVIVA PLUS test strip as directed. 01/11/14   Historical Provider, MD  albuterol (PROVENTIL) (2.5 MG/3ML) 0.083% nebulizer solution Take 3 mLs (2.5 mg total) by nebulization every 6 (six) hours as needed for wheezing or shortness of breath. 04/03/13   Collene Gobble, MD  ALPRAZolam Duanne Moron) 0.25 MG tablet Take 1 tablet (0.25 mg total) by mouth at bedtime as needed for sleep. 02/28/12   Larey Dresser, MD  amLODipine (NORVASC) 10 MG tablet TAKE ONE TABLET BY MOUTH ONCE DAILY 07/20/14   Larey Dresser, MD  aspirin EC 81 MG EC tablet Take 1 tablet (81 mg total) by mouth daily. 06/16/13   Brittainy Erie Noe, PA-C  azithromycin (ZITHROMAX) 250 MG tablet Take as directed 10/14/14   Collene Gobble, MD  budesonide-formoterol [facility]) 160-4.5 MCG/ACT inhaler Inhale 2 puffs into the lungs 2 (two) times daily. 03/11/14   Collene Gobble, MD  Cholecalciferol (VITAMIN D3) 2000 UNITS CHEW Chew by mouth daily.    Historical Provider, MD  clopidogrel (PLAVIX) 75 MG tablet Take 75 mg by mouth daily.    Historical Provider, MD  CRESTOR 40 MG tablet TAKE ONE TABLET BY MOUTH ONCE DAILY 03/16/14   [name], NP  furosemide (LASIX) 40 MG tablet Take 0.5 tablets (20 mg total) by mouth 2 (two) times daily. 03/31/14   Dorothy Spark, MD  insulin lispro (HUMALOG) 100 UNIT/ML injection Inject 10-20 Units into the skin See admin instructions. Per sliding scale 10-20 unit range 3 times daily    Historical Provider, MD  LANTUS SOLOSTAR 100 UNIT/ML Solostar Pen Inject 60 Units as directed at bedtime. 08/05/14   Historical Provider, MD  levocetirizine (XYZAL) 5 MG tablet Take 1 tablet by mouth daily. 07/26/14   Historical Provider, MD  metoprolol succinate (TOPROL XL) 25 MG 24 hr tablet Take 1 tablet (25 mg total) by mouth 2 (two) times daily. 07/09/14   [name], NP  metoprolol succinate (TOPROL-XL) 50 MG 24 hr tablet TAKE ONE TABLET BY MOUTH TWICE DAILY. TAKE  WITH  OR  IMMEDIATELY  FOLLOWING  A  MEAL  10/20/14   Sherren Mocha, MD  omeprazole (PRILOSEC) 40 MG capsule Take 40 mg by mouth 2 (two) times daily.    Historical Provider, MD  traMADol (ULTRAM) 50 MG tablet Take 50 mg by mouth every 8 (eight) hours as needed for moderate pain.  02/13/13   Historical Provider, MD  VENTOLIN HFA 108 (90 BASE) MCG/ACT inhaler INHALE TWO PUFFS EVERY 4 HOURS AS NEEDED 08/26/13   Collene Gobble, MD  VICTOZA 18 MG/3ML SOPN Inject 0.2 mLs (1.2 mg total)  into the skin daily. Inject once daily at the same time Patient taking differently: Inject 0.6 mg into the skin daily. Inject once daily at the same time 10/22/14   Elayne Snare, MD  ZETIA 10 MG tablet TAKE ONE TABLET BY MOUTH ONCE DAILY 08/09/14   Larey Dresser, MD   Triage Vitals: BP 112/53 mmHg  Pulse 68  Temp(Src) 97.9 F (36.6 C)  Resp 18  Ht 5\' 5"  (1.651 m)  Wt 193 lb (87.544 kg)  BMI 32.12 kg/m2  SpO2 100% Physical Exam  Constitutional: She is oriented to person, place, and time.  Eyes: Pupils are equal, round, and reactive to light.  Cardiovascular: Normal rate and regular rhythm.   Murmur heard. 3/6 systolic ejection murmur  Pulmonary/Chest: Effort normal and breath sounds normal. No respiratory distress. She has no wheezes. She has no rales.  Abdominal: Soft. She exhibits no abdominal bruit and no mass. There is no rebound and no guarding.  Hyperactive bowel sounds in the epigastrium  Neurological: She is alert and oriented to person, place, and time. She has normal reflexes.  Skin: Skin is warm and dry.  Nursing note and vitals reviewed.   ED Course  Procedures   DIAGNOSTIC STUDIES: Oxygen Saturation is 100% on RA, normal by my interpretation.    COORDINATION OF CARE: 11:37 PM Discussed treatment plan which includes lab work, EKG, an antiemetic, and pain control with pt at bedside and pt agreed to plan. Labs Review Labs Reviewed  CBC WITH DIFFERENTIAL/PLATELET  COMPREHENSIVE METABOLIC PANEL  LIPASE, BLOOD  TROPONIN I  URINALYSIS,  ROUTINE W REFLEX MICROSCOPIC (NOT AT [facility])    Imaging Review No results found.   EKG Interpretation None      MDM   Final diagnoses:  None   Symptoms consistent with Kidney stone.  Medications  tamsulosin (FLOMAX) capsule 0.4 mg (0.4 mg Oral Given 12/07/14 0227)  oxyCODONE-acetaminophen (PERCOCET/ROXICET) 5-325 MG per tablet 1 tablet (not administered)  ondansetron (ZOFRAN) injection 4 mg (4 mg Intravenous Given 12/07/14 0003)  fentaNYL (SUBLIMAZE) injection 50 mcg (50 mcg Intravenous Given 12/07/14 0003)  gi cocktail (Maalox,Lidocaine,Donnatal) (30 mLs Oral Given 12/07/14 0003)  dicyclomine (BENTYL) injection 20 mg (20 mg Intramuscular Given 12/07/14 0037)  metoCLOPramide (REGLAN) injection 10 mg (10 mg Intravenous Given 12/07/14 0036)  ketorolac (TORADOL) 30 MG/ML injection 30 mg (30 mg Intravenous Given 12/07/14 0147)  cefTRIAXone (ROCEPHIN) 1 g in dextrose 5 % 50 mL IVPB (0 g Intravenous Duplicate [id number])  cefTRIAXone (ROCEPHIN) 1 G injection (1,000 mg  Given 12/07/14 0227)    Results for orders placed or performed during the hospital encounter of 12/06/14  CBC with Differential/Platelet  Result Value Ref Range   WBC 9.6 4.0 - 10.5 K/uL   RBC 4.14 3.87 - 5.11 MIL/uL   Hemoglobin 11.9 (L) 12.0 - 15.0 g/dL   HCT 36.1 36.0 - 46.0 %   MCV 87.2 78.0 - 100.0 fL   MCH 28.7 26.0 - 34.0 pg   MCHC 33.0 30.0 - 36.0 g/dL   RDW 13.1 11.5 - 15.5 %   Platelets 185 150 - 400 K/uL   Neutrophils Relative % 64 43 - 77 %   Neutro Abs 6.2 1.7 - 7.7 K/uL   Lymphocytes Relative 20 12 - 46 %   Lymphs Abs 1.9 0.7 - 4.0 K/uL   Monocytes Relative 10 3 - 12 %   Monocytes Absolute 1.0 0.1 - 1.0 K/uL   Eosinophils Relative 5 0 - 5 %  Eosinophils Absolute 0.5 0.0 - 0.7 K/uL   Basophils Relative 1 0 - 1 %   Basophils Absolute 0.1 0.0 - 0.1 K/uL  Comprehensive metabolic panel  Result Value Ref Range   Sodium 136 135 - 145 mmol/L   Potassium 4.1 3.5 - 5.1 mmol/L   Chloride 99 (L) 101 - 111  mmol/L   CO2 26 22 - 32 mmol/L   Glucose, Bld 186 (H) 65 - 99 mg/dL   BUN 28 (H) 6 - 20 mg/dL   Creatinine, Ser 1.75 (H) 0.44 - 1.00 mg/dL   Calcium 9.5 8.9 - 10.3 mg/dL   Total Protein 6.6 6.5 - 8.1 g/dL   Albumin 3.4 (L) 3.5 - 5.0 g/dL   AST 31 15 - 41 U/L   ALT 25 14 - 54 U/L   Alkaline Phosphatase 51 38 - 126 U/L   Total Bilirubin 1.1 0.3 - 1.2 mg/dL   GFR calc non Af Amer 29 (L) >60 mL/min   GFR calc Af Amer 33 (L) >60 mL/min   Anion gap 11 5 - 15  Lipase, blood  Result Value Ref Range   Lipase 23 22 - 51 U/L  Troponin I  Result Value Ref Range   Troponin I <0.03 <0.031 ng/mL  Urinalysis, Routine w reflex microscopic (not at [facility])  Result Value Ref Range   Color, Urine YELLOW YELLOW   APPearance CLOUDY (A) CLEAR   Specific Gravity, Urine 1.025 1.005 - 1.030   pH 7.5 5.0 - 8.0   Glucose, UA 250 (A) NEGATIVE mg/dL   Hgb urine dipstick NEGATIVE NEGATIVE   Bilirubin Urine NEGATIVE NEGATIVE   Ketones, ur 15 (A) NEGATIVE mg/dL   Protein, ur 100 (A) NEGATIVE mg/dL   Urobilinogen, UA 1.0 0.0 - 1.0 mg/dL   Nitrite NEGATIVE NEGATIVE   Leukocytes, UA SMALL (A) NEGATIVE  Urine microscopic-add on  Result Value Ref Range   Squamous Epithelial / LPF FEW (A) RARE   WBC, UA 3-6 <3 WBC/hpf   Bacteria, UA MANY (A) RARE   Ct Renal Stone Study  12/07/2014   CLINICAL DATA:  Right-sided abdominal pain for 3 hours.  EXAM: CT ABDOMEN AND PELVIS WITHOUT CONTRAST  TECHNIQUE: Multidetector CT imaging of the abdomen and pelvis was performed following the standard protocol without IV contrast.  COMPARISON:  01/31/2012  FINDINGS: There is an obstructing proximal right ureteral calculus at the upper L3 level. The calculus measures 5 mm in cross-section and 9 mm longitudinal. There is moderate right hydronephrosis. There is a 10 mm calculus in the right upper pole collecting system with branching morphology suggesting incipient staghorn formation. No left-sided urinary calculi are evident. There were  no urinary calculi evident on the 01/31/2012 examination.  No other acute findings are evident in the abdomen or pelvis. Small calculi are probably present within the gallbladder lumen. There are unremarkable unenhanced appearances of the liver, bile ducts, spleen, pancreas and adrenals. The abdominal aorta is normal in caliber with moderate atherosclerotic calcification. Bowel appears normal. Uterus and ovaries appear normal.  There is no significant abnormality in the lower chest.  There is no significant musculoskeletal lesion. There is moderately severe degenerative disc change at L4-5.  IMPRESSION: 1. Obstructing right ureteral calculus at the upper L3 level, measuring 5 x 5 x 9 mm. Moderate hydronephrosis 2. Right upper pole collecting system calculus measuring 10 mm, with branching morphology suggesting incipient staghorn formation. Significant interval increase in stone burden, with no calculi visible on the 2013 CT examination.  3. Probable cholelithiasis.  No acute biliary findings.   Electronically Signed   By: Andreas Newport M.D.   On: 12/07/2014 01:51    Follow up with urology in seven days for recheck, call in am to schedule an appointment.  Cannot given NSAID rx due to kidney function.  Return for intractable pain or vomiting or any concerns  I personally performed the services described in this documentation, which was scribed in my presence. The recorded information has been reviewed and is accurate.      Veatrice Kells, MD 12/07/14 [phone]

## 2014-12-07 ENCOUNTER — Emergency Department (HOSPITAL_BASED_OUTPATIENT_CLINIC_OR_DEPARTMENT_OTHER): Payer: Medicare Other

## 2014-12-07 LAB — TROPONIN I: Troponin I: <0.03 ng/mL (ref <0.031)

## 2014-12-07 LAB — COMPREHENSIVE METABOLIC PANEL
ALT: 25 U/L (ref 14–54)
AST: 31 U/L (ref 15–41)
Albumin: 3.4 g/dL — ABNORMAL LOW (ref 3.5–5.0)
Alkaline Phosphatase: 51 U/L (ref 38–126)
Anion gap: 11 (ref 5–15)
BUN: 28 mg/dL — ABNORMAL HIGH (ref 6–20)
CO2: 26 mmol/L (ref 22–32)
Calcium: 9.5 mg/dL (ref 8.9–10.3)
Chloride: 99 mmol/L — ABNORMAL LOW (ref 101–111)
Creatinine, Ser: 1.75 mg/dL — ABNORMAL HIGH (ref 0.44–1.00)
GFR calc Af Amer: 33 mL/min — ABNORMAL LOW (ref >60)
GFR calc non Af Amer: 29 mL/min — ABNORMAL LOW (ref >60)
Glucose, Bld: 186 mg/dL — ABNORMAL HIGH (ref 65–99)
Potassium: 4.1 mmol/L (ref 3.5–5.1)
Sodium: 136 mmol/L (ref 135–145)
Total Bilirubin: 1.1 mg/dL (ref 0.3–1.2)
Total Protein: 6.6 g/dL (ref 6.5–8.1)

## 2014-12-07 LAB — URINE MICROSCOPIC-ADD ON

## 2014-12-07 LAB — URINALYSIS, ROUTINE W REFLEX MICROSCOPIC
Bilirubin Urine: NEGATIVE
Glucose, UA: 250 mg/dL — AB
Hgb urine dipstick: NEGATIVE
Ketones, ur: 15 mg/dL — AB
Nitrite: NEGATIVE
Protein, ur: 100 mg/dL — AB
Specific Gravity, Urine: 1.025 (ref 1.005–1.030)
Urobilinogen, UA: 1 mg/dL (ref 0.0–1.0)
pH: 7.5 (ref 5.0–8.0)

## 2014-12-07 LAB — LIPASE, BLOOD: Lipase: 23 U/L (ref 22–51)

## 2014-12-07 MED ORDER — OXYCODONE-ACETAMINOPHEN 5-325 MG PO TABS
1.0000 | ORAL_TABLET | Freq: Once | ORAL | Status: DC
  Filled 2014-12-07: qty 1

## 2014-12-07 MED ORDER — TAMSULOSIN HCL 0.4 MG PO CAPS: 0.4000 mg | ORAL_CAPSULE | Freq: Every day | ORAL | Status: DC

## 2014-12-07 MED ORDER — METOCLOPRAMIDE HCL 10 MG PO TABS: 10.0000 mg | ORAL_TABLET | Freq: Four times a day (QID) | ORAL | Status: DC

## 2014-12-07 MED ORDER — KETOROLAC TROMETHAMINE 30 MG/ML IJ SOLN
30.0000 mg | Freq: Once | INTRAMUSCULAR | Status: AC
  Administered 2014-12-07: 30 mg via INTRAVENOUS
  Filled 2014-12-07: qty 1

## 2014-12-07 MED ORDER — DICYCLOMINE HCL 10 MG/ML IM SOLN
20.0000 mg | Freq: Once | INTRAMUSCULAR | Status: AC
  Administered 2014-12-07: 20 mg via INTRAMUSCULAR
  Filled 2014-12-07: qty 2

## 2014-12-07 MED ORDER — METOCLOPRAMIDE HCL 5 MG/ML IJ SOLN
10.0000 mg | Freq: Once | INTRAMUSCULAR | Status: AC
  Administered 2014-12-07: 10 mg via INTRAVENOUS
  Filled 2014-12-07: qty 2

## 2014-12-07 MED ORDER — DEXTROSE 5 % IV SOLN: 1.0000 g | Freq: Once | INTRAVENOUS | Status: AC

## 2014-12-07 MED ORDER — TAMSULOSIN HCL 0.4 MG PO CAPS
0.4000 mg | ORAL_CAPSULE | Freq: Every day | ORAL | Status: DC
  Administered 2014-12-07: 0.4 mg via ORAL

## 2014-12-07 MED ORDER — TAMSULOSIN HCL 0.4 MG PO CAPS
ORAL_CAPSULE | ORAL | Status: AC
  Filled 2014-12-07: qty 1

## 2014-12-07 MED ORDER — CEFTRIAXONE SODIUM 1 G IJ SOLR
INTRAMUSCULAR | Status: AC
  Administered 2014-12-07: 1000 mg
  Filled 2014-12-07: qty 10

## 2014-12-07 MED ORDER — PROMETHAZINE HCL 12.5 MG RE SUPP: 12.5000 mg | Freq: Three times a day (TID) | RECTAL | Status: DC | PRN

## 2014-12-07 MED ORDER — OXYCODONE-ACETAMINOPHEN 5-325 MG PO TABS
1.0000 | ORAL_TABLET | ORAL | Status: DC | PRN
Start: 2014-12-07 — End: 2014-12-20

## 2014-12-07 NOTE — ED Notes (Signed)
MD at bedside. 

## 2014-12-07 NOTE — ED Notes (Signed)
Pt states the nausea is not getting better yet and feels clammy. Pt vital signs are WDL. Will notify EDP

## 2014-12-13 ENCOUNTER — Other Ambulatory Visit: Payer: Self-pay | Admitting: Cardiology

## 2014-12-14 ENCOUNTER — Other Ambulatory Visit: Payer: Self-pay | Admitting: Urology

## 2014-12-15 ENCOUNTER — Encounter (HOSPITAL_BASED_OUTPATIENT_CLINIC_OR_DEPARTMENT_OTHER): Payer: Self-pay | Admitting: *Deleted

## 2014-12-16 ENCOUNTER — Encounter (HOSPITAL_BASED_OUTPATIENT_CLINIC_OR_DEPARTMENT_OTHER): Payer: Self-pay | Admitting: *Deleted

## 2014-12-16 ENCOUNTER — Telehealth: Payer: Self-pay | Admitting: Cardiology

## 2014-12-16 NOTE — Telephone Encounter (Signed)
Making sure that the message is routed

## 2014-12-16 NOTE — Progress Notes (Signed)
Pt instructed npo p mn 7/10.  May have clear liquids only until 0600.  Then absolutely nothing by mouth.  To [facility] 7/11@ 1200.  Pt to take norvasc, asa, plavix,crestor,toprol,prilosec,flomax w sip of water.  Pt to use symbicort and bring proventil inhaler w her.  Pt instructed by her pcp, no insulin moring of surgery and to take 1/2 pm lantus until after surgery.  Needs istat on arrival.

## 2014-12-16 NOTE — Progress Notes (Signed)
Received call from Dr. Veatrice Kells.  Ok for pt to proceed w clearance from Dr. Suzy Bouchard, since she is asymptomatic, however, Dr. Mclean(cardiologist) needs to be informed that pt is having this procedure and was told to continue plavix and asa.

## 2014-12-16 NOTE — Telephone Encounter (Signed)
Request for surgical clearance:  1. What type of surgery is being performed? Cysto and tight Retrograde pyelogram and a right lazer  Lithotripsy and right ureteral stent placement  2. When is this surgery scheduled? 12/20/2014  3. Are there any medications that need to be held prior to surgery and how long? Dr. Alyson Ingles is ok with he staying on blood thinner and asprin  4. Name of physician performing surgery? Dr. Alyson Ingles   5. What is your office phone and fax number? [phone] Ext [place] Fax # [phone]  6. Comment: Req a call back to discuss if there are any concerns or if you can notate above this message so that they can print it off and go from there.

## 2014-12-16 NOTE — Progress Notes (Signed)
Spoke w Dr. Jacinto Reap judd, MDA.  Informed him of pt receiving clearance from Dr. Suzy Bouchard, PCP, not cardiology.  Pt denies cardiac complaints. States she was told to continue asa and plavix and that clearance was faxed to Dr. Alyson Ingles at [facility] urology. Pt needs cardiac clearance to proceed.  Left message for Chastity, OR scheduler for Dr. Alyson Ingles.

## 2014-12-16 NOTE — Progress Notes (Signed)
Left message for Hannah [name] at [id number] urology regarding recommendations that Dr. Aundra Dubin be notified of pending surgery w/ orders to continue plavix and asa, per Dr. Veatrice Kells. Also requested that she fax clearance to [facility] to place in pt's chart.

## 2014-12-17 NOTE — Telephone Encounter (Signed)
As long as no new symptoms (new chest pain, etc), should be ok for surgery.  Thanks.

## 2014-12-17 NOTE — Telephone Encounter (Signed)
Spoke with [name]/[facility] to confirm surgical clearance, as noted below. Also Enis Slipper, RN, is faxing note to [facility] to confirm in writing as well.

## 2014-12-17 NOTE — Telephone Encounter (Signed)
LMTCB 7/8

## 2014-12-17 NOTE — Telephone Encounter (Signed)
Spoke with pt who reports no chest pain. States she saw primary care earlier this week and EKG done at that time was OK.  Has irregular heart beat at times but states this is why she is on metoprolol and this is not new or worsened.  States Dr. Aundra Dubin is aware of this.  I told pt I would let [facility] know she can proceed with procedure from a cardiac standpoint. I left message with this information on [name]'s voicemail.  Will fax this note to [facility]. [phone]

## 2014-12-17 NOTE — Telephone Encounter (Signed)
Unable to find any notation of previous surgical clearance request other than phone call from yesterday, 7/7.  Dr. Aundra Dubin is out of the office this entire week. LM on VM for [name]/[facility] to call back to discuss.

## 2014-12-17 NOTE — Telephone Encounter (Signed)
Follow up      Calling to check the status on the surgical clearance.  Surgery is scheduled for Monday.  Please call today and let them know if they need to cancel surgery

## 2014-12-19 ENCOUNTER — Encounter (HOSPITAL_BASED_OUTPATIENT_CLINIC_OR_DEPARTMENT_OTHER): Payer: Self-pay | Admitting: Anesthesiology

## 2014-12-19 NOTE — Anesthesia Preprocedure Evaluation (Addendum)
Anesthesia Evaluation  Patient identified by MRN, date of birth, ID band Patient awake    Reviewed: Allergy & Precautions, H&P , NPO status , Patient's Chart, lab work & pertinent test results, reviewed documented beta blocker date and time , Unable to perform ROS - Chart review only  History of Anesthesia Complications Negative for: history of anesthetic complications  Airway Mallampati: II  TM Distance: >3 FB Neck ROM: full    Dental no notable dental hx.    Pulmonary shortness of breath and with exertion, asthma ,    Pulmonary exam normal       Cardiovascular hypertension, + CAD, + Past MI (no new symptoms, cleared by cardiologist for surgery), + CABG (has had redo cabg), + Peripheral Vascular Disease (B/L iCD disease with 40-60% stenosis) and +CHF + Valvular Problems/Murmurs (s/p AVR in 2009, same time as redo CABG) Rhythm:irregular Rate:Normal     Neuro/Psych CVA negative psych ROS   GI/Hepatic Neg liver ROS, GERD-  ,  Endo/Other  negative endocrine ROSdiabetes, Type 2, Insulin Dependent  Renal/GU Renal disease     Musculoskeletal   Abdominal   Peds  Hematology  (+) anemia ,   Anesthesia Other Findings NPO appropriate, no meds today, allergies reviewed Denies active cardiac or pulmonary symptoms, cleared by cardiologist for procedure No recent congestive cough or symptoms of upper respiratory infection -Given CAD, CVA/PVD history will keep BP within 20% of baseline, will preemptively treat for PONV with scopolamine which has helped in past, persistently in a-fib but should be well controlled with metoprolol - Echo 2014 most recent: EF 50-55%, AVR with mild AI with mean gradient 53mmHg   Reproductive/Obstetrics negative OB ROS                           Anesthesia Physical Anesthesia Plan  ASA: III  Anesthesia Plan: General   Post-op Pain Management:    Induction:  Intravenous  Airway Management Planned: LMA  Additional Equipment:   Intra-op Plan:   Post-operative Plan: Extubation in OR  Informed Consent: I have reviewed the patients History and Physical, chart, labs and discussed the procedure including the risks, benefits and alternatives for the proposed anesthesia with the patient or authorized representative who has indicated his/her understanding and acceptance.   Dental Advisory Given  Plan Discussed with: Anesthesiologist and CRNA  Anesthesia Plan Comments:       Anesthesia Quick Evaluation

## 2014-12-20 ENCOUNTER — Ambulatory Visit (HOSPITAL_BASED_OUTPATIENT_CLINIC_OR_DEPARTMENT_OTHER): Payer: Medicare Other | Admitting: Anesthesiology

## 2014-12-20 ENCOUNTER — Encounter (HOSPITAL_BASED_OUTPATIENT_CLINIC_OR_DEPARTMENT_OTHER)
Admission: RE | Disposition: A | Discharge status: Home / Self Care | Payer: Self-pay | Source: Ambulatory Visit | Attending: Urology

## 2014-12-20 ENCOUNTER — Encounter (HOSPITAL_BASED_OUTPATIENT_CLINIC_OR_DEPARTMENT_OTHER): Payer: Self-pay | Admitting: Anesthesiology

## 2014-12-20 ENCOUNTER — Ambulatory Visit (HOSPITAL_BASED_OUTPATIENT_CLINIC_OR_DEPARTMENT_OTHER)
Admission: RE | Admit: 2014-12-20 | Discharge: 2014-12-20 | Disposition: A | Discharge status: Home / Self Care | Payer: Medicare Other | Source: Ambulatory Visit | Attending: Urology | Admitting: Urology

## 2014-12-20 DIAGNOSIS — K219 Gastro-esophageal reflux disease without esophagitis: Secondary | ICD-10-CM | POA: Insufficient documentation

## 2014-12-20 DIAGNOSIS — I5032 Chronic diastolic (congestive) heart failure: Secondary | ICD-10-CM | POA: Insufficient documentation

## 2014-12-20 DIAGNOSIS — I509 Heart failure, unspecified: Secondary | ICD-10-CM | POA: Diagnosis not present

## 2014-12-20 DIAGNOSIS — I4891 Unspecified atrial fibrillation: Secondary | ICD-10-CM | POA: Diagnosis not present

## 2014-12-20 DIAGNOSIS — Z794 Long term (current) use of insulin: Secondary | ICD-10-CM | POA: Insufficient documentation

## 2014-12-20 DIAGNOSIS — I10 Essential (primary) hypertension: Secondary | ICD-10-CM | POA: Diagnosis not present

## 2014-12-20 DIAGNOSIS — I471 Supraventricular tachycardia: Secondary | ICD-10-CM | POA: Diagnosis not present

## 2014-12-20 DIAGNOSIS — I252 Old myocardial infarction: Secondary | ICD-10-CM | POA: Diagnosis not present

## 2014-12-20 DIAGNOSIS — Z79899 Other long term (current) drug therapy: Secondary | ICD-10-CM | POA: Diagnosis not present

## 2014-12-20 DIAGNOSIS — E785 Hyperlipidemia, unspecified: Secondary | ICD-10-CM | POA: Insufficient documentation

## 2014-12-20 DIAGNOSIS — E119 Type 2 diabetes mellitus without complications: Secondary | ICD-10-CM | POA: Diagnosis not present

## 2014-12-20 DIAGNOSIS — Z87442 Personal history of urinary calculi: Secondary | ICD-10-CM | POA: Diagnosis not present

## 2014-12-20 DIAGNOSIS — J45909 Unspecified asthma, uncomplicated: Secondary | ICD-10-CM | POA: Diagnosis not present

## 2014-12-20 DIAGNOSIS — I2510 Atherosclerotic heart disease of native coronary artery without angina pectoris: Secondary | ICD-10-CM | POA: Diagnosis not present

## 2014-12-20 DIAGNOSIS — N200 Calculus of kidney: Secondary | ICD-10-CM | POA: Diagnosis present

## 2014-12-20 DIAGNOSIS — N202 Calculus of kidney with calculus of ureter: Secondary | ICD-10-CM | POA: Insufficient documentation

## 2014-12-20 HISTORY — DX: Old myocardial infarction: I25.2

## 2014-12-20 HISTORY — DX: Microstomia: Q18.5

## 2014-12-20 HISTORY — DX: Other specified postprocedural states: Z98.890

## 2014-12-20 HISTORY — DX: Paroxysmal atrial fibrillation: I48.0

## 2014-12-20 HISTORY — DX: Moderate persistent asthma, uncomplicated: J45.40

## 2014-12-20 HISTORY — PX: CYSTOSCOPY W/ URETERAL STENT PLACEMENT: SHX1429

## 2014-12-20 HISTORY — DX: Occlusion and stenosis of bilateral carotid arteries: I65.23

## 2014-12-20 HISTORY — DX: Gastro-esophageal reflux disease without esophagitis: K21.9

## 2014-12-20 HISTORY — PX: HOLMIUM LASER APPLICATION: SHX5852

## 2014-12-20 HISTORY — DX: Type 2 diabetes mellitus without complications: E11.9

## 2014-12-20 HISTORY — DX: Psoriasis, unspecified: L40.9

## 2014-12-20 HISTORY — DX: Nausea with vomiting, unspecified: R11.2

## 2014-12-20 HISTORY — DX: Calculus of kidney: N20.0

## 2014-12-20 LAB — POCT I-STAT 4, (NA,K, GLUC, HGB,HCT)
Glucose, Bld: 120 mg/dL — ABNORMAL HIGH (ref 65–99)
HCT: 33 % — ABNORMAL LOW (ref 36.0–46.0)
Hemoglobin: 11.2 g/dL — ABNORMAL LOW (ref 12.0–15.0)
Potassium: 3.9 mmol/L (ref 3.5–5.1)
Sodium: 140 mmol/L (ref 135–145)

## 2014-12-20 LAB — GLUCOSE, CAPILLARY: Glucose-Capillary: 103 mg/dL — ABNORMAL HIGH (ref 65–99)

## 2014-12-20 SURGERY — CYSTOSCOPY, WITH RETROGRADE PYELOGRAM AND URETERAL STENT INSERTION
Anesthesia: General | Site: Bladder | Laterality: Right

## 2014-12-20 MED ORDER — CEFPODOXIME PROXETIL 100 MG PO TABS: 100.0000 mg | ORAL_TABLET | Freq: Two times a day (BID) | ORAL | Status: DC

## 2014-12-20 MED ORDER — FENTANYL CITRATE (PF) 100 MCG/2ML IJ SOLN
INTRAMUSCULAR | Status: AC
  Filled 2014-12-20: qty 4

## 2014-12-20 MED ORDER — SCOPOLAMINE 1 MG/3DAYS TD PT72
MEDICATED_PATCH | TRANSDERMAL | Status: AC
  Filled 2014-12-20: qty 1

## 2014-12-20 MED ORDER — OXYCODONE-ACETAMINOPHEN 5-325 MG PO TABS: 1.0000 | ORAL_TABLET | ORAL | Status: DC | PRN

## 2014-12-20 MED ORDER — ACETAMINOPHEN 10 MG/ML IV SOLN
INTRAVENOUS | Status: DC | PRN
  Administered 2014-12-20: 1000 mg via INTRAVENOUS

## 2014-12-20 MED ORDER — LACTATED RINGERS IV SOLN
INTRAVENOUS | Status: DC
  Administered 2014-12-20: 12:00:00 via INTRAVENOUS
  Filled 2014-12-20: qty 1000

## 2014-12-20 MED ORDER — LIDOCAINE HCL (CARDIAC) 20 MG/ML IV SOLN
INTRAVENOUS | Status: DC | PRN
  Administered 2014-12-20: 100 mg via INTRAVENOUS

## 2014-12-20 MED ORDER — SCOPOLAMINE 1 MG/3DAYS TD PT72
1.0000 | MEDICATED_PATCH | TRANSDERMAL | Status: DC
  Administered 2014-12-20: 1.5 mg via TRANSDERMAL
  Filled 2014-12-20: qty 1

## 2014-12-20 MED ORDER — ONDANSETRON HCL 4 MG/2ML IJ SOLN
INTRAMUSCULAR | Status: DC | PRN
  Administered 2014-12-20: 4 mg via INTRAVENOUS

## 2014-12-20 MED ORDER — FENTANYL CITRATE (PF) 100 MCG/2ML IJ SOLN
INTRAMUSCULAR | Status: DC | PRN
  Administered 2014-12-20 (×4): 25 ug via INTRAVENOUS

## 2014-12-20 MED ORDER — DEXAMETHASONE SODIUM PHOSPHATE 10 MG/ML IJ SOLN
INTRAMUSCULAR | Status: DC | PRN
  Administered 2014-12-20: 10 mg via INTRAVENOUS

## 2014-12-20 MED ORDER — IOHEXOL 350 MG/ML SOLN
INTRAVENOUS | Status: DC | PRN
  Administered 2014-12-20: 10 mL

## 2014-12-20 MED ORDER — CEFAZOLIN SODIUM-DEXTROSE 2-3 GM-% IV SOLR
2.0000 g | INTRAVENOUS | Status: AC
  Administered 2014-12-20: 2 g via INTRAVENOUS
  Filled 2014-12-20: qty 50

## 2014-12-20 MED ORDER — MIDAZOLAM HCL 2 MG/2ML IJ SOLN
INTRAMUSCULAR | Status: AC
  Filled 2014-12-20: qty 2

## 2014-12-20 MED ORDER — KETOROLAC TROMETHAMINE 30 MG/ML IJ SOLN
INTRAMUSCULAR | Status: DC | PRN
  Administered 2014-12-20: 30 mg via INTRAVENOUS

## 2014-12-20 MED ORDER — SODIUM CHLORIDE 0.9 % IR SOLN
Status: DC | PRN
  Administered 2014-12-20: 1000 mL via INTRAVESICAL
  Administered 2014-12-20: 3000 mL via INTRAVESICAL

## 2014-12-20 MED ORDER — CEFAZOLIN SODIUM-DEXTROSE 2-3 GM-% IV SOLR
INTRAVENOUS | Status: AC
  Filled 2014-12-20: qty 50

## 2014-12-20 MED ORDER — PROPOFOL 10 MG/ML IV BOLUS
INTRAVENOUS | Status: DC | PRN
  Administered 2014-12-20: 200 mg via INTRAVENOUS

## 2014-12-20 MED ORDER — FENTANYL CITRATE (PF) 100 MCG/2ML IJ SOLN
25.0000 ug | INTRAMUSCULAR | Status: DC | PRN
  Filled 2014-12-20: qty 1

## 2014-12-20 MED ORDER — CEFAZOLIN SODIUM 1-5 GM-% IV SOLN
1.0000 g | INTRAVENOUS | Status: DC
  Filled 2014-12-20: qty 50

## 2014-12-20 SURGICAL SUPPLY — 21 items
BAG URO CATCHER STRL LF (DRAPE) ×2 IMPLANT
BASKET STONE 1.7 NGAGE (UROLOGICAL SUPPLIES) ×2 IMPLANT
CATH INTERMIT  6FR 70CM (CATHETERS) ×2 IMPLANT
CLOTH BEACON ORANGE TIMEOUT ST (SAFETY) ×2 IMPLANT
FIBER LASER TRAC TIP (UROLOGICAL SUPPLIES) ×2 IMPLANT
GLOVE BIO SURGEON STRL SZ 6.5 (GLOVE) ×2 IMPLANT
GLOVE BIO SURGEON STRL SZ8 (GLOVE) ×2 IMPLANT
GLOVE INDICATOR 6.5 STRL GRN (GLOVE) ×2 IMPLANT
GOWN STRL REUS W/ TWL LRG LVL3 (GOWN DISPOSABLE) ×1 IMPLANT
GOWN STRL REUS W/ TWL XL LVL3 (GOWN DISPOSABLE) ×1 IMPLANT
GOWN STRL REUS W/TWL LRG LVL3 (GOWN DISPOSABLE) ×1
GOWN STRL REUS W/TWL XL LVL3 (GOWN DISPOSABLE) ×1
GUIDEWIRE ANG ZIPWIRE 038X150 (WIRE) ×2 IMPLANT
GUIDEWIRE STR DUAL SENSOR (WIRE) ×2 IMPLANT
IV NS IRRIG 3000ML ARTHROMATIC (IV SOLUTION) ×2 IMPLANT
IV SOD CHL 0.9% 1000ML (IV SOLUTION) ×2 IMPLANT
MANIFOLD NEPTUNE II (INSTRUMENTS) ×2 IMPLANT
PACK CYSTO (CUSTOM PROCEDURE TRAY) ×2 IMPLANT
SHEATH ACCESS URETERAL 38CM (SHEATH) ×2 IMPLANT
STENT URET 6FRX26 CONTOUR (STENTS) ×2 IMPLANT
SYRINGE 10CC LL (SYRINGE) ×2 IMPLANT

## 2014-12-20 NOTE — Anesthesia Procedure Notes (Signed)
Procedure Name: LMA Insertion Date/Time: 12/20/2014 1:45 PM Performed by: Justice Rocher Pre-anesthesia Checklist: Patient identified, Emergency Drugs available, Suction available and Patient being monitored Patient Re-evaluated:Patient Re-evaluated prior to inductionOxygen Delivery Method: Circle System Utilized Preoxygenation: Pre-oxygenation with 100% oxygen Intubation Type: IV induction Ventilation: Mask ventilation without difficulty LMA: LMA inserted LMA Size: 4.0 Number of attempts: 1 Airway Equipment and Method: bite block Placement Confirmation: positive ETCO2 Tube secured with: Tape Dental Injury: Teeth and Oropharynx as per pre-operative assessment

## 2014-12-20 NOTE — Brief Op Note (Signed)
12/20/2014  2:20 PM  PATIENT:  [name] Wales  69 y.o. female  PRE-OPERATIVE DIAGNOSIS:  RIGHT RENAL STONE  POST-OPERATIVE DIAGNOSIS:  RIGHT RENAL STONE  PROCEDURE:  Procedure(s): CYSTOSCOPY WITH RETROGRADE PYELOGRAM/URETERAL STENT PLACEMENT (Right)  HOLMIUM LASER LITHOTRIPSY (Right)  SURGEON:  Surgeon(s) and Role:    * Cleon Gustin, MD - Primary  PHYSICIAN ASSISTANT:   ASSISTANTS: none   ANESTHESIA:   general  EBL:  Total I/O In: 200 [I.V.:200] Out: -   BLOOD ADMINISTERED:none  DRAINS: Right 6x26 JJ ureteral stent  LOCAL MEDICATIONS USED:  MARCAINE     SPECIMEN:  No Specimen  DISPOSITION OF SPECIMEN:  N/A  COUNTS:  YES  TOURNIQUET:  * No tourniquets in log *  DICTATION: .Note written in EPIC  PLAN OF CARE: Discharge to home after PACU  PATIENT DISPOSITION:  PACU - hemodynamically stable.   Delay start of Pharmacological VTE agent (>24hrs) due to surgical blood loss or risk of bleeding: not applicable

## 2014-12-20 NOTE — Discharge Instructions (Signed)
[facility] °[phone] °Post Ureteroscopy With or Without Stent Instructions ° °Definitions: ° °Ureter: The duct that transports urine from the kidney to the bladder. °Stent:   A plastic hollow tube that is placed into the ureter, from the kidney to the                 bladder to prevent the ureter from swelling shut. ° °GENERAL INSTRUCTIONS: ° °Despite the fact that no skin incisions were used, the area around the ureter and bladder is raw and irritated. The stent is a foreign body which will further irritate the bladder wall. This irritation is manifested by increased frequency of urination, both day and night, and by an increase in the urge to urinate. In some, the urge to urinate is present almost always. Sometimes the urge is strong enough that you may not be able to stop yourself from urinating. The only real cure is to remove the stent and then give time for the bladder wall to heal which can't be done until the danger of the ureter swelling shut has passed, which varies. ° °You may see some blood in your urine while the stent is in place and a few days afterwards. Do not be alarmed, even if the urine was clear for a while. Get off your feet and drink lots of fluids until clearing occurs. If you start to pass clots or don't improve, call us. ° °DIET: °You may return to your normal diet immediately. Because of the raw surface of your bladder, alcohol, spicy foods, acid type foods and drinks with caffeine may cause irritation or frequency and should be used in moderation. To keep your urine flowing freely and to avoid constipation, drink plenty of fluids during the day ( 8-10 glasses ). °Tip: Avoid cranberry juice because it is very acidic. ° °ACTIVITY: °Your physical activity doesn't need to be restricted. However, if you are very active, you may see some blood in your urine. We suggest that you reduce your activity under these circumstances until the bleeding has stopped. ° °BOWELS: °It is  important to keep your bowels regular during the postoperative period. Straining with bowel movements can cause bleeding. A bowel movement every other day is reasonable. Use a mild laxative if needed, such as Milk of Magnesia 2-3 tablespoons, or 2 Dulcolax tablets. Call if you continue to have problems. If you have been taking narcotics for pain, before, during or after your surgery, you may be constipated. Take a laxative if necessary. ° ° °MEDICATION: °You should resume your pre-surgery medications unless told not to. In addition you will often be given an antibiotic to prevent infection. These should be taken as prescribed until the bottles are finished unless you are having an unusual reaction to one of the drugs. ° °PROBLEMS YOU SHOULD REPORT TO US: °· Fevers over 100.5 Fahrenheit. °· Heavy bleeding, or clots ( See above notes about blood in urine ). °· Inability to urinate. °· Drug reactions ( hives, rash, nausea, vomiting, diarrhea ). °· Severe burning or pain with urination that is not improving. ° °FOLLOW-UP: °You will need a follow-up appointment to monitor your progress. Call for this appointment at the number listed above. Usually the first appointment will be about three to fourteen days after your surgery. ° ° ° ° ° °Post Anesthesia Home Care Instructions ° °Activity: °Get plenty of rest for the remainder of the day. A responsible adult should stay with you for 24 hours following the procedure.  °  For the next 24 hours, DO NOT: °-Drive a car °-Operate machinery °-Drink alcoholic beverages °-Take any medication unless instructed by your physician °-Make any legal decisions or sign important papers. ° °Meals: °Start with liquid foods such as gelatin or soup. Progress to regular foods as tolerated. Avoid greasy, spicy, heavy foods. If nausea and/or vomiting occur, drink only clear liquids until the nausea and/or vomiting subsides. Call your physician if vomiting continues. ° °Special  Instructions/Symptoms: °Your throat may feel dry or sore from the anesthesia or the breathing tube placed in your throat during surgery. If this causes discomfort, gargle with warm salt water. The discomfort should disappear within 24 hours. ° °If you had a scopolamine patch placed behind your ear for the management of post- operative nausea and/or vomiting: ° °1. The medication in the patch is effective for 72 hours, after which it should be removed.  Wrap patch in a tissue and discard in the trash. Wash hands thoroughly with soap and water. °2. You may remove the patch earlier than 72 hours if you experience unpleasant side effects which may include dry mouth, dizziness or visual disturbances. °3. Avoid touching the patch. Wash your hands with soap and water after contact with the patch. °  ° ° ° °

## 2014-12-20 NOTE — Transfer of Care (Signed)
   Immediate Anesthesia Transfer of Care Note  Patient: Hannah [name]  Procedure(s) Performed: Procedure(s) (LRB): CYSTOSCOPY WITH RETROGRADE PYELOGRAM/URETERAL STENT PLACEMENT (Right)  HOLMIUM LASER LITHOTRIPSY (Right)  Patient Location: PACU  Anesthesia Type: General  Level of Consciousness: awake, sedated, patient cooperative and responds to stimulation  Airway & Oxygen Therapy: Patient Spontanous Breathing and Patient connected to face mask oxygen  Post-op Assessment: Report given to PACU RN, Post -op Vital signs reviewed and stable and Patient moving all extremities  Post vital signs: Reviewed and stable  Complications: No apparent anesthesia complications

## 2014-12-20 NOTE — Op Note (Signed)
Preoperative diagnosis: Right ureteral and renal stone  Postoperative diagnosis: Same  Procedure: 1 cystoscopy 2 right retrograde pyelography 3.  Intraoperative fluoroscopy, under one hour, with interpretation 4.  Right ureteroscopic stone manipulation with laser lithotripsy 5.  Right 6 x 26 JJ stent placement  Attending: Nicolette Bang  Anesthesia: General  Estimated blood loss: None  Drains: Right 6 x 26 JJ ureteral stent without tether  Specimens: stone for analysis  Antibiotics: Ancef  Findings: Right UPJ stone and 1cm mid pole stone. Stone in the mid pole consistent with a matrix stone. No hydronephrosis. No masses/lesions in the bladder. Ureteral orifices in normal anatomic location.  Indications: Patient is a 69 year old female with a history of right ureteral and renal stone and who has persistent right flank pain.  After discussing treatment options, she decided proceed with right ureteroscopic stone manipulation.  Procedure her in detail: The patient was brought to the operating room and a brief timeout was done to ensure correct patient, correct procedure, correct site.  General anesthesia was administered patient was placed in dorsal lithotomy position.  Her genitalia was then prepped and draped in usual sterile fashion.  A rigid 39 French cystoscope was passed in the urethra and the bladder.  Bladder was inspected free masses or lesions.  the right ureteral orifices were in the normal orthotopic locations.  a 6 french ureteral catheter was then instilled into the right ureter orifice.  a gentle retrograde was obtained and findings noted above.  we then placed a zip wire through the ureteral catheter and advanced up to the renal pelvis.  we then removed the cystoscope and cannulated the right ureteral orifice with a semirigid ureteroscope.  At the UPJ a stone was encountered. Using a 200nm laser fiber the stone was fragmented and the fragments were removed with an Ngage  basket. Once we reached the UPJ a sensor wire was advanced in to the renal pelvis. We then removed the ureteroscope and advanced a 12/14x38cm ureteral access sheath and advanced it up to the UPJ. We then used the flexible ureteroscope to perform nephroscopy. We encountered the stone in the mid pole.  using using a 200 nm laser fiber and fragmented the stone into smaller pieces.  the pieces were then removed with a engage basket.  We then placed and good coil was noted in the the renal pelvis under fluoroscopy and the bladder under direct vision.   once all stone fragments were removed we then placed a 6 x 26 double-j ureteral stent over the original zip wire.  the stone fragments were then removed from the bladder and sent for analysis.   the bladder was then drained and this concluded the procedure which was well tolerated by patient.  Complications: None  Condition: Stable, extubated, transferred to PACU  Plan: Patient is to be discharged home as to follow-up in 2 weeks for renal US andstent removal. Vantin 100mg  BID for 14 days

## 2014-12-21 ENCOUNTER — Encounter (HOSPITAL_BASED_OUTPATIENT_CLINIC_OR_DEPARTMENT_OTHER): Payer: Self-pay | Admitting: Urology

## 2014-12-21 NOTE — Anesthesia Postprocedure Evaluation (Signed)
  Anesthesia Post-op Note  Patient: Hannah [name]  Procedure(s) Performed: Procedure(s) (LRB): CYSTOSCOPY WITH RETROGRADE PYELOGRAM/URETERAL STENT PLACEMENT (Right)  HOLMIUM LASER LITHOTRIPSY (Right)  Patient Location: PACU  Anesthesia Type: General  Level of Consciousness: awake and alert   Airway and Oxygen Therapy: Patient Spontanous Breathing  Post-op Pain: mild  Post-op Assessment: Post-op Vital signs reviewed, Patient's Cardiovascular Status Stable, Respiratory Function Stable, Patent Airway and No signs of Nausea or vomiting  Last Vitals:  Filed Vitals:   12/20/14 1549  BP: 119/52  Pulse: 73  Temp: 36.4 C  Resp: 12    Post-op Vital Signs: stable   Complications: No apparent anesthesia complications

## 2014-12-22 NOTE — H&P (Signed)
Urology Admission H&P  Chief Complaint: right flank pain  History of Present Illness: Hannah [name] is a 69yo wjho presented with severe right flank pain. She was diagnosed with a mid ureteral stone and a right renal stone. She presents today for treatment  Past Medical History  Diagnosis Date  . Hypertension   . Hyperlipidemia   . CHF (congestive heart failure)     EF is low normal at 50 to 55% per echo in Jan. 2011  . Renal insufficiency, mild   . Nonischemic cardiomyopathy   . Anemia   . Iron deficiency anemia   . S/P AVR     prosthesis valve placement 2009 at same time re-do CABG  . Hearing loss of right ear   . PAF (paroxysmal atrial fibrillation)   . Bilateral carotid artery stenosis     Bilateral ICA 40-59%/  >50% LECA  . Dyspnea   . CAD (coronary artery disease) cardiologist-  dr Aundra Dubin    Remote CABG in 1998 with redo in 2009  . Type 2 diabetes mellitus     monitored by dr Dwyane Dee  . Renal calculus, right   . History of non-ST elevation myocardial infarction (NSTEMI)     Sept 2014--  thought to be type II HTN w/ LHC without infarct related artery and patent grafts  . PONV (postoperative nausea and vomiting)     pt states patch worked well before  . Stroke     residual rt hearing loss  . Moderate persistent asthma     pulmologist-  Dr. Malvin Johns  . GERD (gastroesophageal reflux disease)   . Nausea & vomiting     related to stone  . Abnormally small mouth   . Bruises easily   . Psoriasis     right leg   Past Surgical History  Procedure Laterality Date  . Aortic valve replacement  2009    #65mm [facility] Ease pericardial valve (done same time is CABG)  . Left heart catheterization with coronary/graft angiogram N/A 02/23/2013    Procedure: LEFT HEART CATHETERIZATION WITH Beatrix Fetters;  Surgeon: Blane Ohara, MD;  Location: [facility] CATH LAB;  Service: Cardiovascular;  Laterality: N/A;  . Coronary artery bypass graft  1998 &  re-do 2009    Had LIMA to DX/LAD,  SVG to 2 marginal branches and SVG to [facility] originally; SVG to 3rd OM and PD at time of redo  . Cardiac catheterization  03/23/2008    Pre-redo CABG: L main OK, LAD (T), CFX (T), OM1 99%, RCA (T), LIMA-LAD OK, SVG-OM(?3) OK w/ little florw to OM2, SVG-RCA OK. EF NL  . Tubal ligation    . Tonsillectomy    . Carpal tunnel release    . Transthoracic echocardiogram  02-24-2013       mild LVH,  ef 50-55%/  AV bioprosthesis was present with very mild stenosis and no regurg., mean grandient 41mmHg, peak grandient 11mmHg /  mild MR/  mild LAE and RAE/  moderate TR  . Cardiovascular stress test  06-15-2013      dr Debara Pickett    [facility] perfusion study/  no evidence ischemia/  normal wall motion, ef 72%  . Eye surgery Bilateral     cataracts  . Cystoscopy w/ ureteral stent placement Right 12/20/2014    Procedure: CYSTOSCOPY WITH RETROGRADE PYELOGRAM/URETERAL STENT PLACEMENT;  Surgeon: Cleon Gustin, MD;  Location: [facility];  Service: Urology;  Laterality: Right;  . Holmium laser application Right [id number]  Procedure:  HOLMIUM LASER LITHOTRIPSY;  Surgeon: Cleon Gustin, MD;  Location: [facility];  Service: Urology;  Laterality: Right;    Home Medications:  No prescriptions prior to admission   Allergies:  Allergies  Allergen Reactions  . Imdur [Isosorbide Dinitrate] Other (See Comments)    Headache/ severe hypotension  . Amoxicillin Rash  . Tetracycline Rash    Family History  Problem Relation Age of Onset  . Heart disease Mother   . Heart disease Father   . Heart failure Father   . Diabetes Son   . Diabetes Maternal Grandmother    Social History:  reports that she has never smoked. She has never used smokeless tobacco. She reports that she does not drink alcohol or use illicit drugs.  Review of Systems  Genitourinary: Positive for flank pain.  All other systems reviewed and are negative.   Physical Exam:  Vital signs in last 24 hours:    Physical Exam  Constitutional: She is oriented to person, place, and time. She appears well-developed and well-nourished.  HENT:  Head: Normocephalic and atraumatic.  Eyes: EOM are normal. Pupils are equal, round, and reactive to light.  Neck: Normal range of motion. Neck supple.  Cardiovascular: Normal rate and regular rhythm.   Respiratory: Effort normal and breath sounds normal.  GI: Soft. She exhibits no distension. There is no tenderness.  Musculoskeletal: Normal range of motion.  Neurological: She is alert and oriented to person, place, and time.  Skin: Skin is warm and dry.  Psychiatric: She has a normal mood and affect. Her behavior is normal. Judgment and thought content normal.    Laboratory Data:  No results found for this or any previous visit (from the past 24 hour(s)). No results found for this or any previous visit (from the past 240 hour(s)). Creatinine: No results for input(s): CREATININE in the last 168 hours.   Impression/Assessment:  Right ureteral and renal stone  Plan:  Risks/benefits/alternatives to right ureteroscopic stone extraction was explained to the patient and she understands and wishes to proceed with surgery  Masayo Fera L 12/22/2014, 11:56 AM

## 2015-01-05 ENCOUNTER — Ambulatory Visit (INDEPENDENT_AMBULATORY_CARE_PROVIDER_SITE_OTHER): Payer: Medicare Other | Admitting: Endocrinology

## 2015-01-05 ENCOUNTER — Other Ambulatory Visit (INDEPENDENT_AMBULATORY_CARE_PROVIDER_SITE_OTHER): Payer: Medicare Other

## 2015-01-05 ENCOUNTER — Encounter: Payer: Medicare Other | Attending: Endocrinology | Admitting: Nutrition

## 2015-01-05 ENCOUNTER — Ambulatory Visit: Payer: Medicare Other | Admitting: Nutrition

## 2015-01-05 ENCOUNTER — Encounter: Payer: Self-pay | Admitting: Endocrinology

## 2015-01-05 VITALS — BP 100/50 | HR 85 | Temp 98.0°F | Resp 16 | Ht 64.5 in | Wt 199.0 lb

## 2015-01-05 DIAGNOSIS — E1165 Type 2 diabetes mellitus with hyperglycemia: Secondary | ICD-10-CM | POA: Diagnosis not present

## 2015-01-05 DIAGNOSIS — E119 Type 2 diabetes mellitus without complications: Secondary | ICD-10-CM | POA: Diagnosis not present

## 2015-01-05 DIAGNOSIS — N289 Disorder of kidney and ureter, unspecified: Secondary | ICD-10-CM | POA: Diagnosis not present

## 2015-01-05 DIAGNOSIS — Z794 Long term (current) use of insulin: Secondary | ICD-10-CM | POA: Diagnosis not present

## 2015-01-05 DIAGNOSIS — R031 Nonspecific low blood-pressure reading: Secondary | ICD-10-CM | POA: Diagnosis not present

## 2015-01-05 DIAGNOSIS — IMO0002 Reserved for concepts with insufficient information to code with codable children: Secondary | ICD-10-CM

## 2015-01-05 DIAGNOSIS — E785 Hyperlipidemia, unspecified: Secondary | ICD-10-CM

## 2015-01-05 DIAGNOSIS — Z713 Dietary counseling and surveillance: Secondary | ICD-10-CM | POA: Insufficient documentation

## 2015-01-05 LAB — LIPID PANEL
Cholesterol: 108 mg/dL (ref 0–200)
HDL: 27.3 mg/dL — ABNORMAL LOW (ref >39.00)
LDL Cholesterol: 49 mg/dL (ref 0–99)
NonHDL: 80.7
Total CHOL/HDL Ratio: 4
Triglycerides: 161 mg/dL — ABNORMAL HIGH (ref 0.0–149.0)
VLDL: 32.2 mg/dL (ref 0.0–40.0)

## 2015-01-05 LAB — COMPREHENSIVE METABOLIC PANEL
ALT: 22 U/L (ref 0–35)
AST: 22 U/L (ref 0–37)
Albumin: 3.2 g/dL — ABNORMAL LOW (ref 3.5–5.2)
Alkaline Phosphatase: 55 U/L (ref 39–117)
BUN: 19 mg/dL (ref 6–23)
CO2: 29 mEq/L (ref 19–32)
Calcium: 9.2 mg/dL (ref 8.4–10.5)
Chloride: 102 mEq/L (ref 96–112)
Creatinine, Ser: 1.77 mg/dL — ABNORMAL HIGH (ref 0.40–1.20)
GFR: 30.2 mL/min — ABNORMAL LOW (ref >60.00)
Glucose, Bld: 149 mg/dL — ABNORMAL HIGH (ref 70–99)
Potassium: 3.7 mEq/L (ref 3.5–5.1)
Sodium: 138 mEq/L (ref 135–145)
Total Bilirubin: 0.6 mg/dL (ref 0.2–1.2)
Total Protein: 6.3 g/dL (ref 6.0–8.3)

## 2015-01-05 LAB — MICROALBUMIN / CREATININE URINE RATIO
Creatinine,U: 213.4 mg/dL
Microalb Creat Ratio: 15.3 mg/g (ref 0.0–30.0)
Microalb, Ur: 32.7 mg/dL — ABNORMAL HIGH (ref 0.0–1.9)

## 2015-01-05 LAB — POCT GLYCOSYLATED HEMOGLOBIN (HGB A1C): Hemoglobin A1C: 7.7

## 2015-01-05 MED ORDER — ALBIGLUTIDE 30 MG ~~LOC~~ PEN: PEN_INJECTOR | SUBCUTANEOUS | Status: DC

## 2015-01-05 NOTE — Progress Notes (Signed)
She was shown the Tanzium pen, and how to mix, wait 15 min., and mix again before injecting.  She reported good understanding of this.  She was given a starter kit with a DVD of how to mix the medication, and written instructions for this.   We also discussed the need to take more Humalog when eating more carbohydrates in the meal.  We reviewed what carbs were, and which meals she will need to add 2 to 4 extra units to.  She reported good understanding of this.  We also reviewed how to add extra insulin when blood sugars were higher than 149.  She was shown how to do the math to figure the extra humalog, but could not do this.  She was given a scale of 150-199 take 1 extra unit, 200-249: 2 extra units, 250-299:3 extra, 300-349: 4 extra.  She reported good understanding of this.  We also reviewed which meals she is eating that are high in fat, and the need to take extra Humalog for those meals.  She reported good understanding of this and had no final questions.

## 2015-01-05 NOTE — Patient Instructions (Addendum)
Humalog 10-14 at meals based on meal size and Carbs. May reduce but not skip  Extra 1 unit per 50 mg over 100  2-4 more for hi fat  Stop Norvasc

## 2015-01-05 NOTE — Progress Notes (Signed)
Patient ID: Hannah [name], female   DOB: 01-23-1946, 69 y.o.   MRN: [id number]           Reason for Appointment: Follow-up for Type 2 Diabetes  Referring physician: Suzy Bouchard  History of Present Illness:          Date of diagnosis of type 2 diabetes mellitus: Late 1980s        Background history:  She was previously treated with metformin prior to starting insulin She thinks she has been on insulin for at least 10 years and mostly taking Lantus and other types of insulin with it Her metformin was stopped when she started insulin and also has had renal dysfunction Generally she has had difficulty controlling her diabetes  Recent history:   INSULIN regimen is described as: Lantus 56 hs Humalog 12 u/15g ac meals     Previously her A1c had been consistently over 8% and occasionally higher. Because of poor control she was switched from Januvia to [place] on her initial consultation in 5/16 She had difficulties with nausea taking this and was able to take only 0.6 mg; subsequently because of nausea she has not been able to increase the dose as directed.  She is still having some nausea at this dose and does not want to continue She overall has lost weight and has had better control of appetite with Victoza and her A1c is now below 8% She has also seen the dietitian  Her Lantus was reduced by 4 units on her last visit  Current blood sugar patterns and problems identified:  Her blood sugars are quite variable at all times although relatively more stable in the mornings  FASTING blood sugars are averaging about 115 recently but she was also having relatively low blood sugars about 10 days ago  Blood sugars tend to be increasing in the afternoon and evenings and usually are the highest late at night with readings as high as 397  Not clear when she is eating her meals as her blood sugars are being checked randomly at various times  More recently she has had sporadic readings over 200    Mealtime insulin: She has some difficulty understanding how to take this and will sometimes take as much as 25 units for high readings  She will also skip her insulin and sometimes before eating when her blood sugar is normal  Was told to take at least 14 units at supper time but is not doing so    Oral hypoglycemic drugs the patient is taking are: None Side effects from medications have been: None  Compliance with the medical regimen: Fair Hypoglycemia: Sporadically, may occur in the morning or suppertime    Glucose monitoring:  done about 3 times a day         Glucometer:  Accu-Chek [employer] Blood Glucose readings by meter download  Mean values apply above for all meters except median for One Touch  PRE-MEAL Fasting Lunch Dinner Bedtime Overall  Glucose range:  62-457   110-364   103-216   118-397    Mean/median:  127    173   186   165    Self-care: The diet that the patient has been following is: tries to limit fats.  uses diet green tea and no sugar in drinks   Typical meal intake: Breakfast is eggs              Dietician visit, most recent: 5/16  Exercise: Water exercises 3/7 days  Weight history:  Wt Readings from Last 3 Encounters:  01/05/15 199 lb (90.266 kg)  12/20/14 201 lb (91.173 kg)  12/06/14 193 lb (87.544 kg)    Glycemic control: Last A1c 8.9 from PCP    Lab Results  Component Value Date   HGBA1C 7.7 01/05/2015   HGBA1C 9.9* 06/15/2013   HGBA1C * 04/01/2008    8.3 (NOTE)   The ADA recommends the following therapeutic goal for glycemic   control related to Hgb A1C measurement:   Goal of Therapy:   < 7.0% Hgb A1C   Reference: American Diabetes Association: Clinical Practice   Recommendations 2008, Diabetes Care,  2008, 31:(Suppl 1).   Lab Results  Component Value Date   MICROALBUR 32.7* 01/05/2015   LDLCALC 49 01/05/2015   CREATININE 1.77* 01/05/2015         Medication List       This list is accurate as of: 01/05/15 11:59 PM.   Always use your most recent med list.               Albiglutide 30 MG Pen  Commonly known as:  TANZEUM  30mg  weekly     albuterol (2.5 MG/3ML) 0.083% nebulizer solution  Commonly known as:  PROVENTIL  Take 3 mLs (2.5 mg total) by nebulization every 6 (six) hours as needed for wheezing or shortness of breath.     VENTOLIN HFA 108 (90 BASE) MCG/ACT inhaler  Generic drug:  albuterol  INHALE TWO PUFFS EVERY 4 HOURS AS NEEDED     ALPRAZolam 0.25 MG tablet  Commonly known as:  XANAX  Take 1 tablet (0.25 mg total) by mouth at bedtime as needed for sleep.     amLODipine 10 MG tablet  Commonly known as:  NORVASC  TAKE ONE TABLET BY MOUTH ONCE DAILY     aspirin 81 MG EC tablet  Take 1 tablet (81 mg total) by mouth daily.     budesonide-formoterol 160-4.5 MCG/ACT inhaler  Commonly known as:  SYMBICORT  Inhale 2 puffs into the lungs 2 (two) times daily.     cefpodoxime 100 MG tablet  Commonly known as:  VANTIN  Take 1 tablet (100 mg total) by mouth 2 (two) times daily.     clopidogrel 75 MG tablet  Commonly known as:  PLAVIX  Take 75 mg by mouth daily.     CRESTOR 40 MG tablet  Generic drug:  rosuvastatin  TAKE ONE TABLET BY MOUTH ONCE DAILY     furosemide 40 MG tablet  Commonly known as:  LASIX  Take 0.5 tablets (20 mg total) by mouth 2 (two) times daily.     insulin lispro 100 UNIT/ML injection  Commonly known as:  HUMALOG  Inject 10-20 Units into the skin See admin instructions. Per sliding scale 10-20 unit range 3 times daily     LANTUS SOLOSTAR 100 UNIT/ML Solostar Pen  Generic drug:  Insulin Glargine  Inject 56 Units as directed at bedtime.     levocetirizine 5 MG tablet  Commonly known as:  XYZAL  Take 1 tablet by mouth daily.     metoCLOPramide 10 MG tablet  Commonly known as:  REGLAN  Take 1 tablet (10 mg total) by mouth every 6 (six) hours.     metoprolol succinate 50 MG 24 hr tablet  Commonly known as:  TOPROL-XL  TAKE ONE TABLET BY MOUTH TWICE DAILY.  TAKE  WITH  OR  IMMEDIATELY  FOLLOWING  A  MEAL     omeprazole 40 MG capsule  Commonly known as:  PRILOSEC  Take 40 mg by mouth 2 (two) times daily.     oxyCODONE-acetaminophen 5-325 MG per tablet  Commonly known as:  PERCOCET  Take 1 tablet by mouth every 4 (four) hours as needed.     promethazine 12.5 MG suppository  Commonly known as:  PHENERGAN  Place 1 suppository (12.5 mg total) rectally every 8 (eight) hours as needed for nausea or vomiting.     tamsulosin 0.4 MG Caps capsule  Commonly known as:  FLOMAX  Take 1 capsule (0.4 mg total) by mouth daily.     traMADol 50 MG tablet  Commonly known as:  ULTRAM  Take 50 mg by mouth every 8 (eight) hours as needed for moderate pain.     VICTOZA 18 MG/3ML Sopn  Generic drug:  Liraglutide  Inject 0.2 mLs (1.2 mg total) into the skin daily. Inject once daily at the same time     Vitamin D3 2000 UNITS Chew  Chew by mouth daily.     ZETIA 10 MG tablet  Generic drug:  ezetimibe  TAKE ONE TABLET BY MOUTH ONCE DAILY        Allergies:  Allergies  Allergen Reactions  . Imdur [Isosorbide Dinitrate] Other (See Comments)    Headache/ severe hypotension  . Amoxicillin Rash  . Tetracycline Rash    Past Medical History  Diagnosis Date  . Hypertension   . Hyperlipidemia   . CHF (congestive heart failure)     EF is low normal at 50 to 55% per echo in Jan. 2011  . Renal insufficiency, mild   . Nonischemic cardiomyopathy   . Anemia   . Iron deficiency anemia   . S/P AVR     prosthesis valve placement 2009 at same time re-do CABG  . Hearing loss of right ear   . PAF (paroxysmal atrial fibrillation)   . Bilateral carotid artery stenosis     Bilateral ICA 40-59%/  >50% LECA  . Dyspnea   . CAD (coronary artery disease) cardiologist-  dr Aundra Dubin    Remote CABG in 1998 with redo in 2009  . Type 2 diabetes mellitus     monitored by dr Dwyane Dee  . Renal calculus, right   . History of non-ST elevation myocardial infarction (NSTEMI)      Sept 2014--  thought to be type II HTN w/ LHC without infarct related artery and patent grafts  . PONV (postoperative nausea and vomiting)     pt states patch worked well before  . Stroke     residual rt hearing loss  . Moderate persistent asthma     pulmologist-  Dr. Malvin Johns  . GERD (gastroesophageal reflux disease)   . Nausea & vomiting     related to stone  . Abnormally small mouth   . Bruises easily   . Psoriasis     right leg    Past Surgical History  Procedure Laterality Date  . Aortic valve replacement  2009    #53mm [facility] Ease pericardial valve (done same time is CABG)  . Left heart catheterization with coronary/graft angiogram N/A 02/23/2013    Procedure: LEFT HEART CATHETERIZATION WITH Beatrix Fetters;  Surgeon: Blane Ohara, MD;  Location: [facility] CATH LAB;  Service: Cardiovascular;  Laterality: N/A;  . Coronary artery bypass graft  1998 &  re-do 2009    Had LIMA to DX/LAD, SVG to 2 marginal branches and SVG to  RC originally; SVG to 3rd OM and PD at time of redo  . Cardiac catheterization  03/23/2008    Pre-redo CABG: L main OK, LAD (T), CFX (T), OM1 99%, RCA (T), LIMA-LAD OK, SVG-OM(?3) OK w/ little florw to OM2, SVG-RCA OK. EF NL  . Tubal ligation    . Tonsillectomy    . Carpal tunnel release    . Transthoracic echocardiogram  02-24-2013       mild LVH,  ef 50-55%/  AV bioprosthesis was present with very mild stenosis and no regurg., mean grandient 36mmHg, peak grandient 77mmHg /  mild MR/  mild LAE and RAE/  moderate TR  . Cardiovascular stress test  06-15-2013      dr Debara Pickett    [facility] perfusion study/  no evidence ischemia/  normal wall motion, ef 72%  . Eye surgery Bilateral     cataracts  . Cystoscopy w/ ureteral stent placement Right 12/20/2014    Procedure: CYSTOSCOPY WITH RETROGRADE PYELOGRAM/URETERAL STENT PLACEMENT;  Surgeon: Cleon Gustin, MD;  Location: [facility];  Service: Urology;  Laterality: Right;  . Holmium laser  application Right [id number]    Procedure:  HOLMIUM LASER LITHOTRIPSY;  Surgeon: Cleon Gustin, MD;  Location: [facility];  Service: Urology;  Laterality: Right;    Family History  Problem Relation Age of Onset  . Heart disease Mother   . Heart disease Father   . Heart failure Father   . Diabetes Son   . Diabetes Maternal Grandmother     Social History:  reports that she has never smoked. She has never used smokeless tobacco. She reports that she does not drink alcohol or use illicit drugs.    Review of Systems    Lipid history: On 40 mg Crestor along with Zetia since she has had her coronary artery disease diagnosis    Lab Results  Component Value Date   CHOL 108 01/05/2015   HDL 27.30* 01/05/2015   LDLCALC 49 01/05/2015   LDLDIRECT 109.2 03/01/2014   TRIG 161.0* 01/05/2015   CHOLHDL 4 01/05/2015           Eyes:   Most recent eye exam was 2016 and was not told to have any retinopathy  Hypertension: She has had relatively low blood pressure readings recently and was told to reduce amlodipine by PCP Home blood pressure readings have been under 110 usually  Diabetic foot exam in 5/16 shows She has complete loss of monofilament sensation in her distal feet with only mild sensitivity present on the right first and second toes  Physical Examination:  BP 100/50 mmHg  Pulse 85  Temp(Src) 98 F (36.7 C)  Resp 16  Ht 5' 4.5" (1.638 m)  Wt 199 lb (90.266 kg)  BMI 33.64 kg/m2  SpO2 96%     ASSESSMENT:  Diabetes type 2, uncontrolled   She has overall better controlled with A1c 7.7 See history of present illness for detailed discussion of his current management, blood sugar patterns and problems identified Although she has generally benefited from using Victoza she does not want to continue this because of nausea Her blood sugars overall have been fluctuating markedly and not clear why Some of her high blood sugars are related to skipping the  mealtime insulin when the blood sugar is near-normal before eating Also probably not consistent with diet  Fasting readings are generally stable recently and does not usually high   PLAN:   Start Tanzeum 30 mg weekly  instead of Victoza, this was discussed in detail and she was instructed by nurse educator  Discussed action and timing of mealtime insulin and need to cover all meals with insulin unless not eating carbohydrate at all.  She will adjust the dose based on meal size and carbohydrates.  She will add 2-4 units for higher fat meals  No change in Lantus  Stop Norvasc and continue monitoring blood pressure  Increase exercise as tolerated  Call if blood sugars in order consistently controlled  Patient Instructions  Humalog 10-14 at meals based on meal size and Carbs. May reduce but not skip  Extra 1 unit per 50 mg over 100  2-4 more for hi fat  Stop Norvasc   Counseling time on subjects discussed above is over 50% of today's 25 minute visit  Jemia Fata 01/06/2015, 12:49 PM   Note: This office note was prepared with Estate agent. Any transcriptional errors that result from this process are unintentional.

## 2015-01-13 IMAGING — CR DG FOOT COMPLETE 3+V*L*
3 series · 3 of 3 positions shown · non-contrast
Comparison: None.

CLINICAL DATA: Injured left foot.

EXAM:
LEFT FOOT - COMPLETE 3+ VIEW

[t foot ap left]
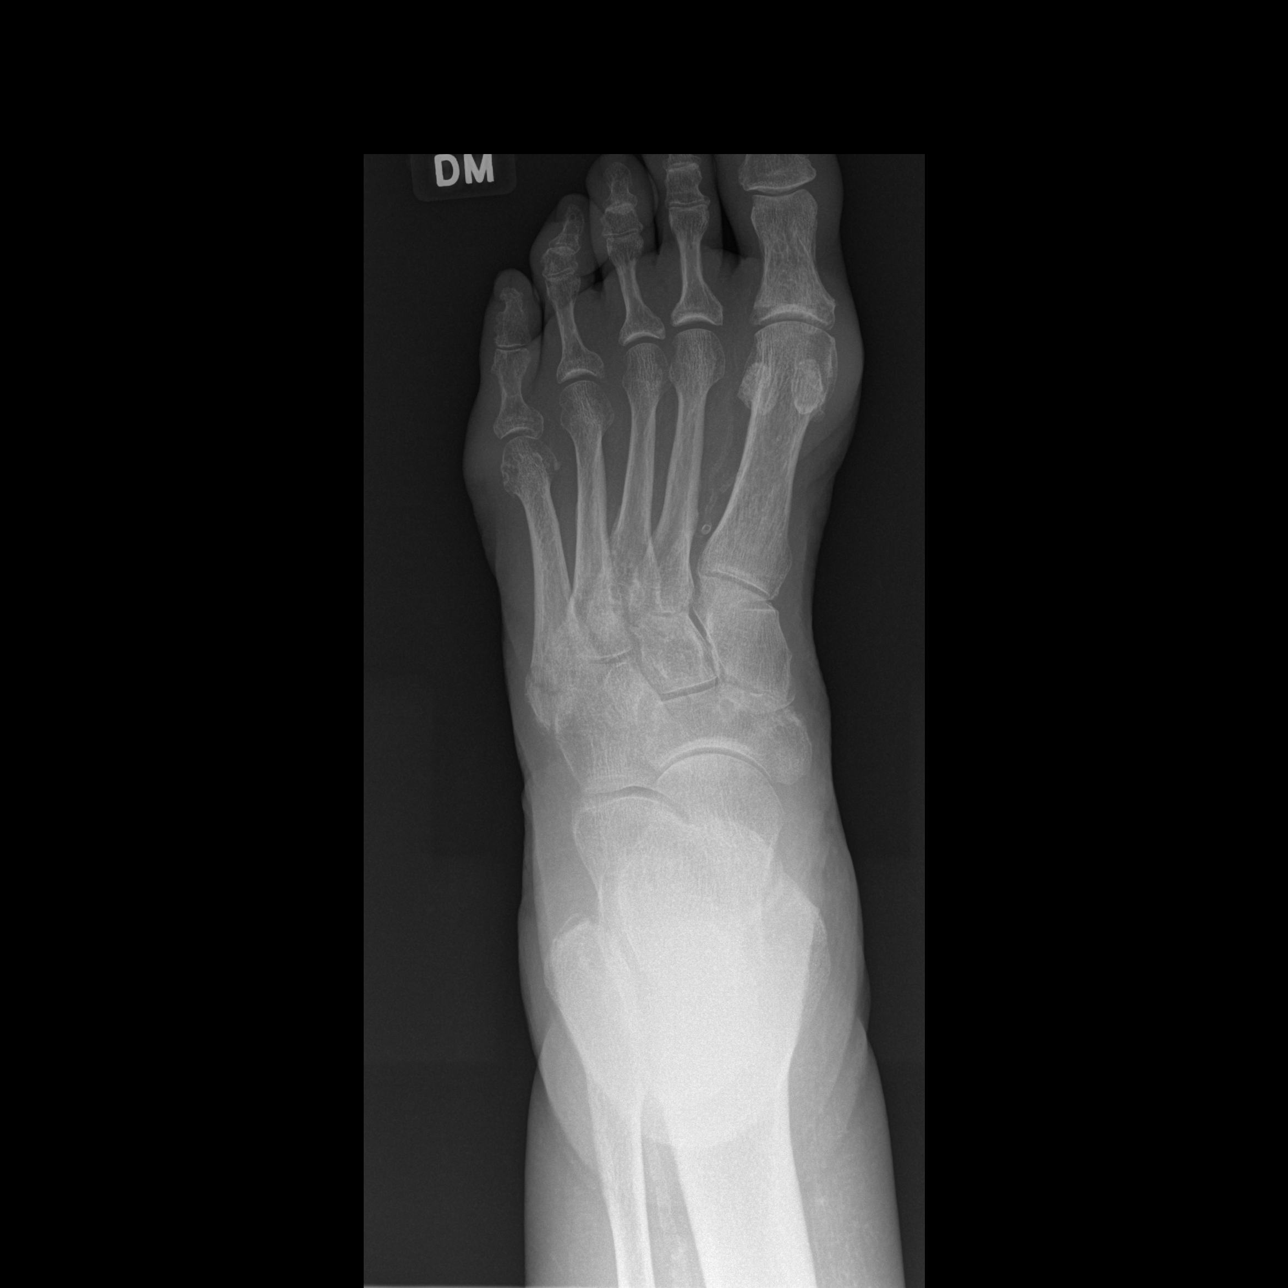

[t foot oblique left]
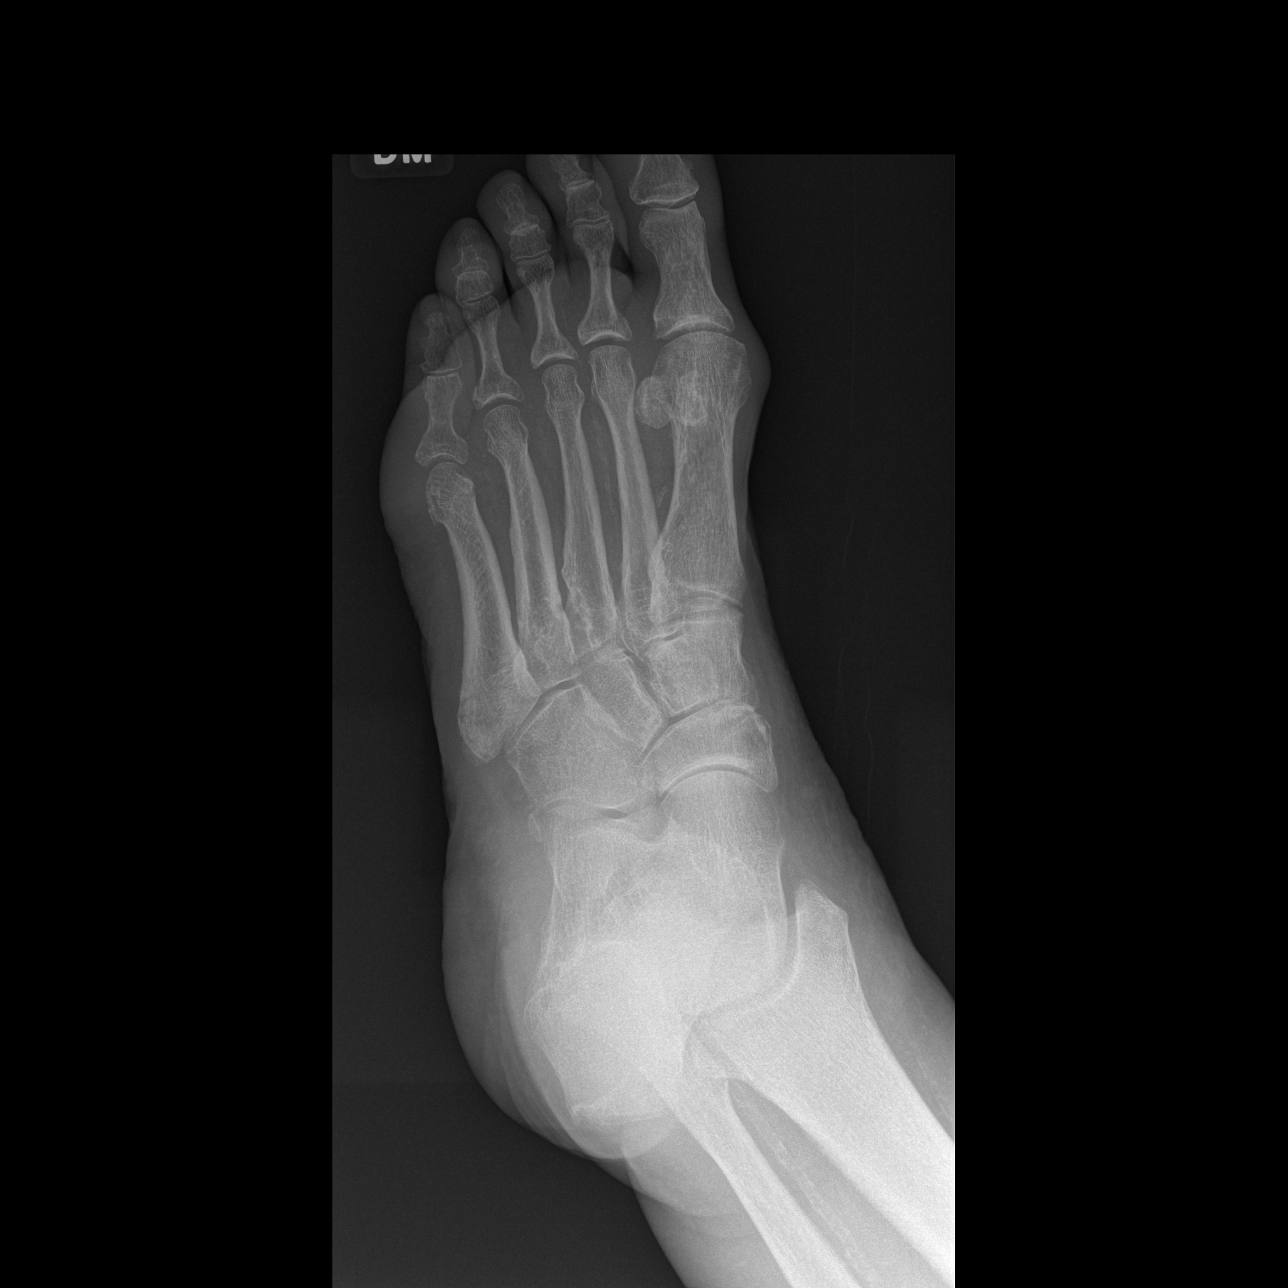

[t foot lat left]
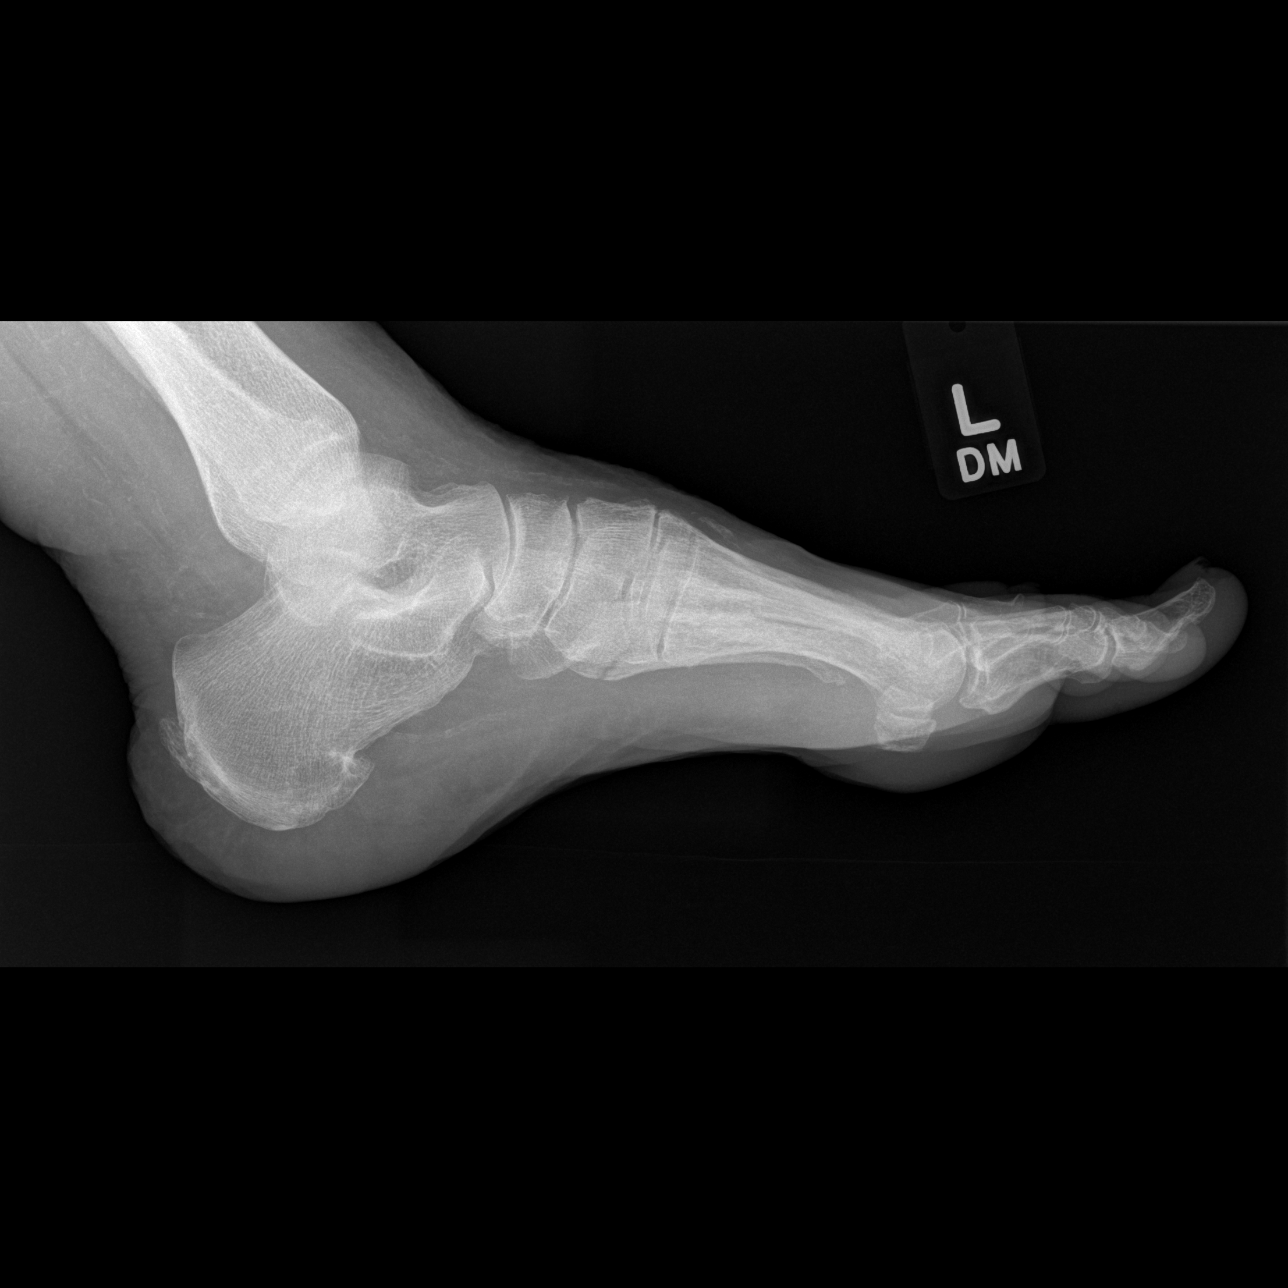

[3 of 3 positions shown; findings below may reference images not displayed]

FINDINGS: There is a nondisplaced transverse fracture at the base of the 5th
metatarsal. No other fractures are identified. The joint spaces are
maintained.
IMPRESSION: Nondisplaced 5th metatarsal base fracture.

## 2015-01-14 ENCOUNTER — Telehealth: Payer: Self-pay | Admitting: Emergency Medicine

## 2015-01-14 ENCOUNTER — Other Ambulatory Visit: Payer: Self-pay | Admitting: Emergency Medicine

## 2015-01-14 ENCOUNTER — Telehealth: Payer: Self-pay | Admitting: Cardiology

## 2015-01-14 DIAGNOSIS — R002 Palpitations: Secondary | ICD-10-CM

## 2015-01-14 DIAGNOSIS — I480 Paroxysmal atrial fibrillation: Secondary | ICD-10-CM

## 2015-01-14 MED ORDER — ALBUTEROL SULFATE HFA 108 (90 BASE) MCG/ACT IN AERS: 2.0000 | INHALATION_SPRAY | RESPIRATORY_TRACT | Status: DC | PRN

## 2015-01-14 NOTE — Telephone Encounter (Signed)
Agree, needs 30 day monitor.

## 2015-01-14 NOTE — Telephone Encounter (Signed)
Pt has had problems for years with irregular heart rate. HX paroxsymal afib. Recently her meds were adjusted for rate break through, metoprolol was increased to 75 mg BID, due to hypotension amlodipine was stopped. Now she states x 1 week her rate has increased as high as 120's lasting for a couple of minutes/ this is happening daily and it happened 3 times in one day.  Usual bp 100/50 but during episodes it goes to 120/80.  Pt is sitting or laying down when episode happens. Denies CP/ SOB. Please advise. Spoke with Truitt Merle np/ per Dr Claris Gladden last OV note pt is to have a 30 day monitor with follow up in 5 weeks.

## 2015-01-14 NOTE — Telephone Encounter (Signed)
New message      Patient c/o Palpitations:  High priority if patient c/o lightheadedness and shortness of breath.  1. How long have you been having palpitations? Pt having irr heart beat for 1 week 2. Are you currently experiencing lightheadedness and shortness of breath? no 3. Have you checked your BP and heart rate? (document readings)  HR up to 126 when she has an episode---last about 5-15 minutes at a time--  4. Are you experiencing any other symptoms? No Last time this happened, pt upped her metoprolol.  Will she need to adjust her meds?

## 2015-01-14 NOTE — Telephone Encounter (Signed)
LMTCB- we need to know if she is needing the inhaler or neb sol

## 2015-01-14 NOTE — Telephone Encounter (Signed)
Spoke with the pt  She states that she is needing ventolin inhaler  Rx was sent and nothing further needed

## 2015-01-18 ENCOUNTER — Ambulatory Visit (INDEPENDENT_AMBULATORY_CARE_PROVIDER_SITE_OTHER): Payer: Medicare Other

## 2015-01-18 DIAGNOSIS — R002 Palpitations: Secondary | ICD-10-CM | POA: Diagnosis not present

## 2015-01-18 DIAGNOSIS — I480 Paroxysmal atrial fibrillation: Secondary | ICD-10-CM

## 2015-01-19 ENCOUNTER — Other Ambulatory Visit: Payer: Self-pay | Admitting: Nurse Practitioner

## 2015-01-29 ENCOUNTER — Telehealth: Payer: Self-pay | Admitting: Internal Medicine

## 2015-01-29 NOTE — Telephone Encounter (Signed)
Pt is a 69 yo F w/ a PMH significant for CAD s/p CABG X 2, SAVR, and paroxysmal afib/atrial tachycardia w/ increasing burden of palpitations. I was called by [employer] this evening to let me know pt has manually transmitted a few rhythm strips showing rapid regular narrow complex tachycardia interpreted as atrial tachycardia vs. flutter. Electronic transmissions are not available for me to review at this time. I spoke to pt who says this has been going on for weeks. Palpitations can come on at anytime. Has had a few episodes today. HRs in 120s for minutes and then back down to 70s. Denies CP, SOB, or presyncopal/syncopal symptoms. Takes metoprolol succinate 75 mg PO bid. Advised pt to continue to take metoprolol and call clinic on Mon to arrange follow-up appointment to discuss AAD vs. ablation as means to decrease burden of palpitations. Pt able to contract for safety and amenable to plan.  Jay Schlichter, MD [facility]

## 2015-01-30 ENCOUNTER — Other Ambulatory Visit: Payer: Self-pay | Admitting: Nurse Practitioner

## 2015-01-31 ENCOUNTER — Telehealth: Payer: Self-pay | Admitting: Cardiology

## 2015-01-31 MED ORDER — METOPROLOL SUCCINATE ER 100 MG PO TB24: 100.0000 mg | ORAL_TABLET | Freq: Two times a day (BID) | ORAL | Status: DC

## 2015-01-31 NOTE — Telephone Encounter (Signed)
Pt advised,verbalized understanding. 

## 2015-01-31 NOTE — Telephone Encounter (Signed)
Dr Aundra Dubin reviewed tracings.

## 2015-01-31 NOTE — Telephone Encounter (Signed)
Pt states she is calling to let Dr Aundra Dubin know that she has an episode of palpitations and fast heart beat in Saturday 01/29/15, pt states it lasted a few minutes, she has not had any more episodes since then. I have event strips from 01/29/15 1:31PM (CST) for Dr Aundra Dubin to review.  I have asked pt to send in a follow up tracing now and she states she has done that now.   Pt advised I will review tracings with Dr Aundra Dubin this afternoon when he is in the office.

## 2015-01-31 NOTE — Telephone Encounter (Signed)
New Message     Pt will like RN to give her a call back about the call that she   received over the weekend about her [employer] services

## 2015-01-31 NOTE — Telephone Encounter (Signed)
Per Dr Aundra Dubin increase Toprol XL to 100mg  two times a day.

## 2015-02-01 ENCOUNTER — Telehealth: Payer: Self-pay | Admitting: Cardiology

## 2015-02-01 NOTE — Telephone Encounter (Signed)
Pt advised,verbalized understanding. 

## 2015-02-01 NOTE — Telephone Encounter (Signed)
Monitor tracings reviewed by Tenny Craw, PA- 10:11 pm (CST) --sinus rhythm, atrial couplets, atrial runs 10:13PM (CST) sinus rhythm, PAC(s), atrial runs 10:20PM (CST) sinus rhythm, PAC(s), atrial couplet(s),atrial run(s) 10:25PM (CST) atrial tach  Per Brian--no new recommendations, continue to monitor, take Toprol XL 100mg  bid as recommended by Dr Aundra Dubin 01/31/15.

## 2015-02-01 NOTE — Telephone Encounter (Signed)
Pt states she was sitting in chair last night about 11PM and developed fast heart beat. Pt states episode last night felt past episodes of fast heart beat.  Pt states it lasted about 30 minutes, she activated her monitor 3-4 times to record the episode, she took a Xanax with relief after about 30 minutes.  Pt denies lightheadedness or dizziness, she states her BP during the episode was 172/140 on her home BP monitor, pt states her BP this AM was 120/61.

## 2015-02-01 NOTE — Telephone Encounter (Signed)
New message     Pt had another episode of heart skipping beats around 11pm and lasted for 30 minutes Pt states she did increase medication yesterday per your instructions Pt is wearing heart monitor Pt is not having any symptoms at this time Please call to discuss

## 2015-02-01 NOTE — Telephone Encounter (Signed)
Dr Aundra Dubin recommended pt increase metoprolol succinate to 100mg  bid from 75mg  bid 01/31/15-pt states she took her  first dose of 100mg  around 7PM yesterday.   Pt advised I am waiting for tracings to be downloaded by [employer] and faxed to our office, once these are available and have been reviewed with provider I will follow up with her.

## 2015-02-08 ENCOUNTER — Telehealth: Payer: Self-pay | Admitting: Endocrinology

## 2015-02-08 ENCOUNTER — Other Ambulatory Visit: Payer: Self-pay | Admitting: *Deleted

## 2015-02-08 MED ORDER — INSULIN LISPRO 100 UNIT/ML (KWIKPEN): PEN_INJECTOR | SUBCUTANEOUS | Status: DC

## 2015-02-08 NOTE — Telephone Encounter (Signed)
Please call pt she has some concerns about medication refills [phone]

## 2015-02-08 NOTE — Telephone Encounter (Signed)
This question needs to be addressed to the [facility]

## 2015-02-10 ENCOUNTER — Other Ambulatory Visit: Payer: Self-pay | Admitting: *Deleted

## 2015-02-10 ENCOUNTER — Telehealth: Payer: Self-pay | Admitting: Endocrinology

## 2015-02-10 MED ORDER — DULAGLUTIDE 0.75 MG/0.5ML ~~LOC~~ SOAJ: SUBCUTANEOUS | Status: DC

## 2015-02-10 NOTE — Telephone Encounter (Signed)
Please call in refill for humalog informed her it was called in  Also pt is asking about the meds she talked about on 8/30 that is not covered by insurance she does not remember the name

## 2015-02-10 NOTE — Telephone Encounter (Signed)
It was about tanzeum

## 2015-03-02 IMAGING — CR DG CHEST 1V PORT
1 series · 1 of 1 positions shown · non-contrast
Comparison: 02/23/2013

CLINICAL DATA: Right-sided chest pain

EXAM:
PORTABLE CHEST - 1 VIEW

[AP]
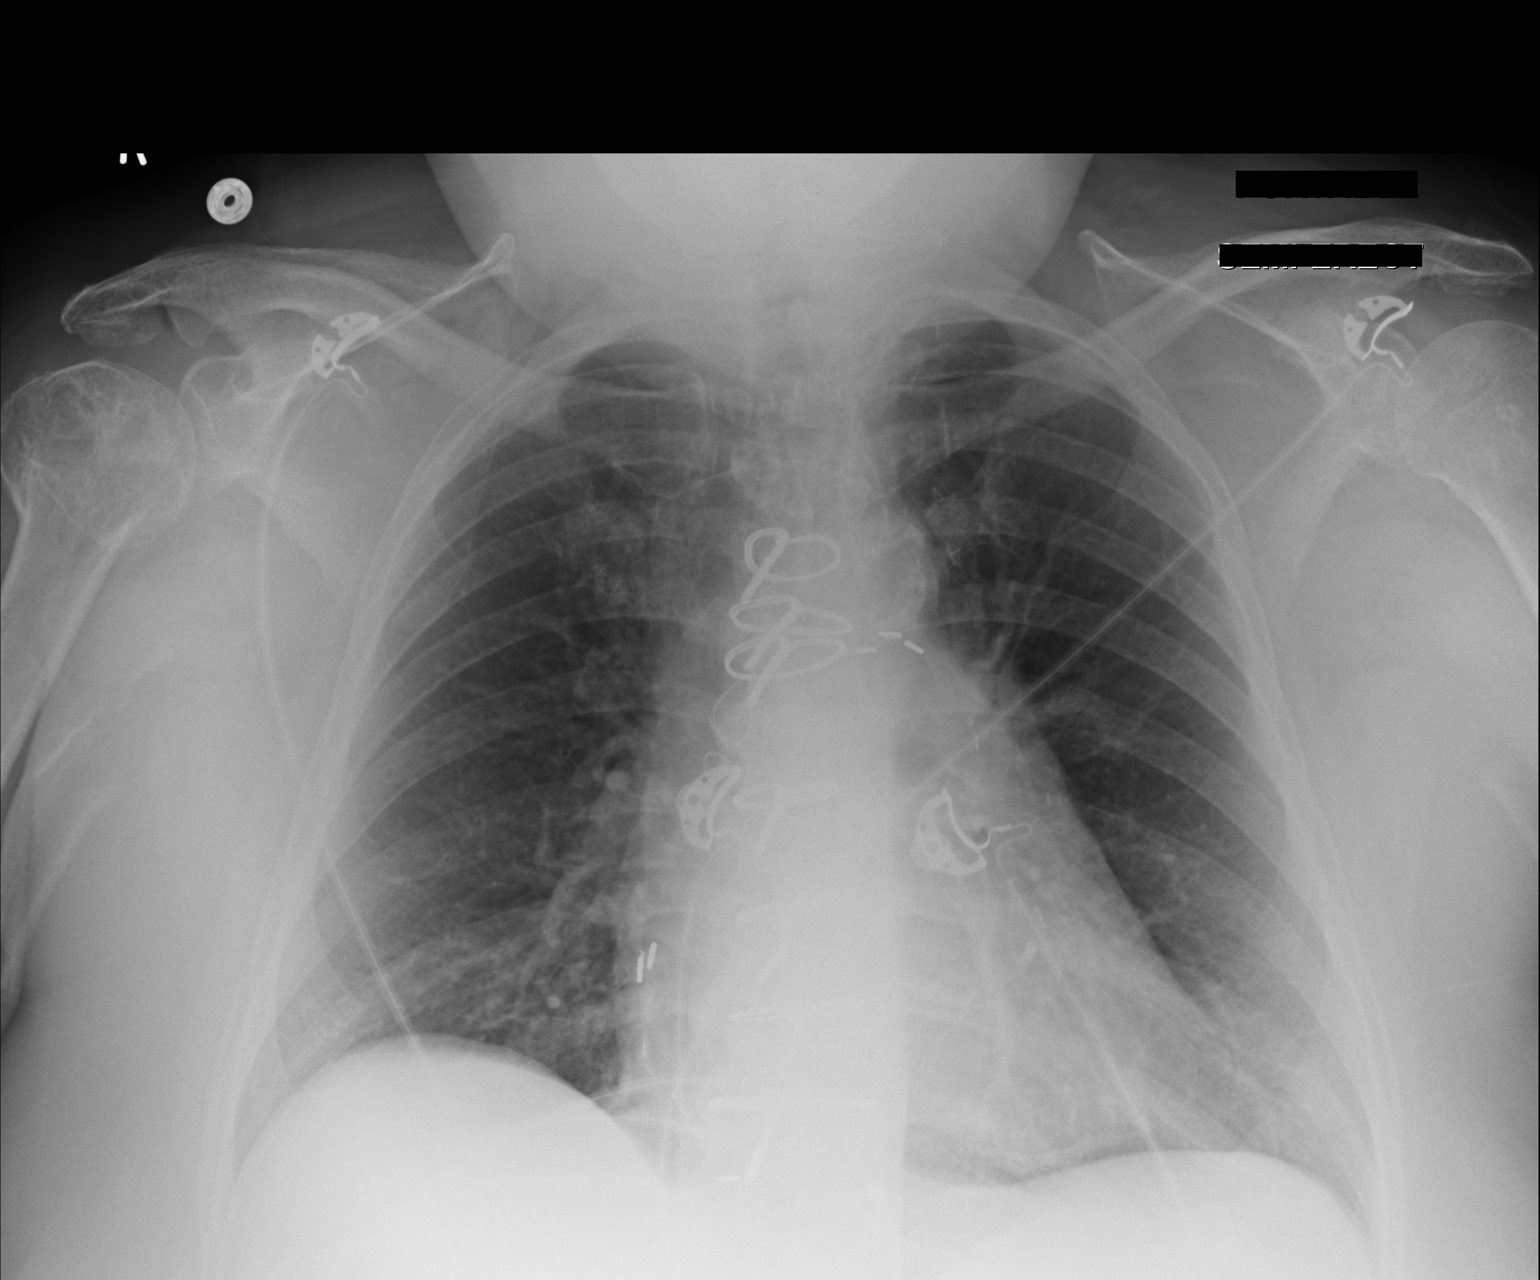

[1 of 1 positions shown; findings below may reference images not displayed]

FINDINGS: Changes of median sternotomy for CABG. Patient's known aortic valve
replacement is not well visualized on this frontal portable view.
Stable heart and mediastinal contours. Pulmonary vascularity within
normal limits. No focal airspace opacity, effusion, or pneumothorax.
Chronic deformity of the right humeral neck, status post remote
fracture in [id number] again noted. No acute bony abnormality.
IMPRESSION: Stable chest radiograph.  No acute findings.

## 2015-03-04 ENCOUNTER — Ambulatory Visit (INDEPENDENT_AMBULATORY_CARE_PROVIDER_SITE_OTHER): Payer: Medicare Other | Admitting: Cardiology

## 2015-03-04 ENCOUNTER — Encounter: Payer: Self-pay | Admitting: Cardiology

## 2015-03-04 VITALS — BP 104/54 | HR 66 | Ht 64.0 in | Wt 198.0 lb

## 2015-03-04 DIAGNOSIS — Z954 Presence of other heart-valve replacement: Secondary | ICD-10-CM

## 2015-03-04 DIAGNOSIS — E785 Hyperlipidemia, unspecified: Secondary | ICD-10-CM | POA: Diagnosis not present

## 2015-03-04 DIAGNOSIS — Z952 Presence of prosthetic heart valve: Secondary | ICD-10-CM

## 2015-03-04 DIAGNOSIS — I2510 Atherosclerotic heart disease of native coronary artery without angina pectoris: Secondary | ICD-10-CM | POA: Diagnosis not present

## 2015-03-04 DIAGNOSIS — I471 Supraventricular tachycardia: Secondary | ICD-10-CM

## 2015-03-04 DIAGNOSIS — I779 Disorder of arteries and arterioles, unspecified: Secondary | ICD-10-CM | POA: Diagnosis not present

## 2015-03-04 DIAGNOSIS — N183 Chronic kidney disease, stage 3 (moderate): Secondary | ICD-10-CM

## 2015-03-04 DIAGNOSIS — I739 Peripheral vascular disease, unspecified: Principal | ICD-10-CM

## 2015-03-04 NOTE — Patient Instructions (Signed)
Medication Instructions: - no changes  Labwork: - none  Procedures/Testing: - Your physician has requested that you have a carotid duplex (in November). This test is an ultrasound of the carotid arteries in your neck. It looks at blood flow through these arteries that supply the brain with blood. Allow one hour for this exam. There are no restrictions or special instructions.  Follow-Up: - As scheduled with Dr. Lovena Le  - Your physician wants you to follow-up in: 4 months with Dr. Aundra Dubin. You will receive a reminder letter in the mail two months in advance. If you don't receive a letter, please call our office to schedule the follow-up appointment.  Any Additional Special Instructions Will Be Listed Below (If Applicable). - none

## 2015-03-06 NOTE — Progress Notes (Signed)
Patient ID: Hannah [name], female   DOB: 1946-04-22, 69 y.o.   MRN: [id number] PCP: Dr. Suzy Bouchard  69 yo with history of CAD s/p CABG, bioprosthetic AVR, CKD, paroxysmal atrial fibrillation presents for cardiology followup.  She had her initial CABG in 1998, then had redo CABG in 2009 with bioprosthetic AVR.  She does not remember when she last had documented atrial fibrillation, but she thinks it was a number of years ago, sounds peri-operative around the time of her redo CABG.  She was never put on anticoagulation but has been on dronedarone.   In 9/14, she developed tachypalpitations and later chest pain.  In the ER, SBP was in the 200s with elevated HR. Troponin was mildly elevated.  Therefore, she had a cardiac cath showing patent grafts.  She was thought to have had demand ischemia in the setting of hypertensive emergency + tachycardia.  After discharge, she continued to have runs of tachypalpitations.  A 3 week event monitor was put on her.  This showed runs, sometimes long, of atrial tachycardia. She was started on Toprol XL when these runs were noted.  No further palpitations (complete resolution of symptoms).    In 1/15, she was admitted to [facility] with atypical chest pain.  She was ruled out for MI and Lexiscan Cardiolite showed no ischemia.  She was started on Imdur and sent home.  After discharge, she developed a daily headache and nausea.  She did not eat or drink much due to the nausea. Shortly after this, she was standing in her grandson's pediatrician's office and became severely lightheaded.  She had to sit down.  She came to the ER and was found to be orthostatic.  She was given IV fluid and got better quickly.  I had her stop Imdur and symptoms seem to have completely resolved.    She has been feeling more fluttering/palpitations.  Event monitor in 8/16 showed atrial tachycardia versus atypical atrial flutter.  Toprol XL was increased to 100 mg bid.  Some decrease in palpitations but they  are still bothersome.  No lightheadedness or syncope.  No chest pain.  No exertional dyspnea on flat ground.  She can walk up 2 flights of steps without much problem.  She has not been taking Lasix.  Weight is stable.   ECG: NSR, nonspecific T wave flattening.   Labs (2/13): BNP 80, K 5.3, creatinine 1.1 Labs (6/13): LDL 76, HDL 41 Labs (8/13): K 4.3, creatinine 1.38 Labs (9/13): creatinine 1.6, proBNP 128 Labs (9/14): K 4.7, creatinine 1.5, LDL 82, HDL 35 Labs (10/14): K 4.8, creatinine 1.5 Labs (1/15): LDL 105, HDL 37 Labs (2/15): K 4.1, creatinine 1.28, HCT 34 Labs (4/15): K 4.2, creatinine 1.5 => 1.4, BNP 95 Labs (1/16): K 3.8, creatinine 1.6, Hgb 10.5 Labs (7/16): K 3.7, creatinine 1.77, LDL 49, HDL 27  PMH: 1. Diabetes mellitus 2. HTN 3. Obesity 4. Atrial arrhythmias: Paroxysmal atrial fibrillation seems to have been post-operative after CAD-AVR. She has a history of amiodarone toxicity. She has not been on coumadin.  3-week event monitor (9/13) with no atrial fibrillation.  3-week event monitor (10/14) with runs of probable atrial tachycardia.  30 day monitor in 8/16 with atrial tachycardia versus atypical atrial flutter (multiple episodes).   5. CAD: CABG 1998 with LIMA-LAD/diagonal, sequential SVG-OM1/OM2, SVG-RCA.  Redo CABG 2009 with SVG-OM3, SVG-PDA.  Lexiscan myoview (9/13): EF 61%, no ischemia or infarction.  LHC (10/14) with all grafts patent except the old SVG-RCA.  There  was 90% stenosis in the AV LCx before ungrafted MOM, but this was unchanged from prior study.  Lexiscan Cardiolite (1/15) with no ischemia. She does not tolerate Imdur.  6. CKD 7. Bioprosthetic aortic valve: Echo (2011) with EF 50-55%, bioprosthetic aortic valve well-seated.  Echo (2/13) with EF 60-65%, bioprosthetic aortic valve looked ok.  Echo (9/14): EF 50-55%, mild LVH, bioprosthetic AVR with mean gradient 10, mild MR, moderate TR.  8. Asthma: since her teens. 9. Low back pain.  10. CVA: Small pontine  CVA on MRI in summer 2013.  Per neurologist, did not appear cardioembolic.   11. Carotid stenosis: 40-59% bilateral ICA stenosis on 10/14 carotid dopplers. Carotid doppler (11/15) with 40-59% bilateral ICA stenosis.  12. Sleep study 12/14 without significant OSA.  13. Chronic diastolic CHF  SH: Never smoked.  Lives in [place].  Married.    FH: No premature CAD.   ROS: All systems reviewed and negative except as per HPI.    Current Outpatient Prescriptions  Medication Sig Dispense Refill  . albuterol (PROVENTIL) (2.5 MG/3ML) 0.083% nebulizer solution Take 3 mLs (2.5 mg total) by nebulization every 6 (six) hours as needed for wheezing or shortness of breath. 180 mL 0  . ALPRAZolam (XANAX) 0.25 MG tablet Take 1 tablet (0.25 mg total) by mouth at bedtime as needed for sleep. 30 tablet 0  . aspirin EC 81 MG EC tablet Take 1 tablet (81 mg total) by mouth daily.    . budesonide-formoterol (SYMBICORT) 160-4.5 MCG/ACT inhaler Inhale 2 puffs into the lungs 2 (two) times daily. 1 Inhaler 6  . clopidogrel (PLAVIX) 75 MG tablet Take 75 mg by mouth daily.    . CRESTOR 40 MG tablet TAKE ONE TABLET BY MOUTH ONCE DAILY 30 tablet 2  . Dulaglutide (TRULICITY) A999333 0000000 SOPN Inject the contents of one pen once a week 2 mL 2  . furosemide (LASIX) 40 MG tablet Take 0.5 tablets (20 mg total) by mouth 2 (two) times daily. 30 tablet 5  . insulin lispro (HUMALOG KWIKPEN) 100 UNIT/ML KiwkPen Inject 10-20 units three times a day. 45 mL 3  . LANTUS SOLOSTAR 100 UNIT/ML Solostar Pen Inject 56 Units as directed at bedtime.     Marland Kitchen levocetirizine (XYZAL) 5 MG tablet Take 1 tablet by mouth daily.    . metoprolol succinate (TOPROL-XL) 100 MG 24 hr tablet Take 1 tablet (100 mg total) by mouth 2 (two) times daily. Take with or immediately following a meal. 180 tablet 1  . traMADol (ULTRAM) 50 MG tablet Take 50 mg by mouth every 8 (eight) hours as needed for moderate pain.     . VENTOLIN HFA 108 (90 BASE) MCG/ACT inhaler  INHALE TWO PUFFS INTO LUNGS EVERY 4 HOURS AS NEEDED 18 each 11  . ZETIA 10 MG tablet TAKE ONE TABLET BY MOUTH ONCE DAILY 30 tablet 3   No current facility-administered medications for this visit.    BP 104/54 mmHg  Pulse 66  Ht 5\' 4"  (1.626 m)  Wt 198 lb (89.812 kg)  BMI 33.97 kg/m2  SpO2 95% General: NAD Neck: Thick neck, JVP 7 cm, no thyromegaly or thyroid nodule.  Lungs: Clear to auscultation bilaterally with normal respiratory effort. CV: Nondisplaced PMI.  Heart regular S1/S2, +S4, 2/6 SEM RUSB.  No edema.  Bilateral carotid bruits.  Normal pedal pulses.  Abdomen: Soft, nontender, no hepatosplenomegaly, no distention.  Neurologic: Alert and oriented x 3.  Psych: Normal affect. Extremities: No clubbing or cyanosis.   Assessment/Plan:  1. CAD  Admission in 9/14 with mild troponin elevation.  LHC showed stable anatomy with patent grafts except for SVG-RCA from older CABG.  Suspect that the troponin elevation was due to demand ischemia in the setting of hypertensive emergency and tachycardia (possible run of atrial tachycardia).  Admission in 1/15 with atypical chest pain, had Lexiscan Cardiolite that showed no ischemia.  No recent chest pain.  - Continue ASA 81, Plavix, statin, Toprol XL.      2. Atrial Arrhythmias Patient has not been on anticoagulation. She apparently had atrial fibrillation at some point in the past. It seems that the atrial fibrillation may have been post-op after redo CABG and has not recurred. She is in NSR today. She has been having more palpitations recently, and has had atrial tachycardia versus atypical atrial flutter noted on 30 day monitor.  If this is atrial flutter, would have indication for anticoagulation. She is already on Toprol XL 100 mg bid.  - Refer to EP to help define rhythm. Given frequent breakthrough symptoms on Toprol XL 100 mg bid, I think that it would be reasonable to consider ablation.  3. HYPERLIPIDEMIA  Good lipids on Crestor and  Zetia.  4. Bioprosthetic aortic valve Well-seated on last echo in 2014.  5. Carotid bruit Repeat carotid dopplers in 11/16.   6. Chronic diastolic CHF Currently not taking Lasix.  Volume status looks ok.   7. CKD Recent BMET was stable.   Followup in 4 months.   Loralie Champagne 03/06/2015

## 2015-03-09 ENCOUNTER — Ambulatory Visit (INDEPENDENT_AMBULATORY_CARE_PROVIDER_SITE_OTHER): Payer: Medicare Other | Admitting: Endocrinology

## 2015-03-09 ENCOUNTER — Encounter: Payer: Self-pay | Admitting: Endocrinology

## 2015-03-09 ENCOUNTER — Encounter (HOSPITAL_COMMUNITY): Payer: Medicare Other

## 2015-03-09 ENCOUNTER — Other Ambulatory Visit: Payer: Medicare Other

## 2015-03-09 VITALS — BP 130/74 | HR 86 | Temp 98.3°F | Resp 16 | Ht 64.0 in | Wt 198.0 lb

## 2015-03-09 DIAGNOSIS — E1165 Type 2 diabetes mellitus with hyperglycemia: Secondary | ICD-10-CM

## 2015-03-09 DIAGNOSIS — IMO0002 Reserved for concepts with insufficient information to code with codable children: Secondary | ICD-10-CM

## 2015-03-09 DIAGNOSIS — Z23 Encounter for immunization: Secondary | ICD-10-CM | POA: Diagnosis not present

## 2015-03-09 DIAGNOSIS — I10 Essential (primary) hypertension: Secondary | ICD-10-CM

## 2015-03-09 NOTE — Patient Instructions (Signed)
Try lantus in am  May need 16-18 at supper for larger meals or eating out  Must take shot before eating  Check blood sugars on waking up .Marland Kitchen3-5  .Marland Kitchen times a week Also check blood sugars about 2 hours after a meal and do this after different meals by rotation  Recommended blood sugar levels on waking up is 90-130 and about 2 hours after meal is 140-180 Please bring blood sugar monitor to each visit.

## 2015-03-09 NOTE — Progress Notes (Signed)
During Patient ID: Hannah [name], female   DOB: [birth year], 69 y.o.   MRN: [id number]           Reason for Appointment: Follow-up for Type 2 Diabetes  Referring physician: Suzy Bouchard  History of Present Illness:          Date of diagnosis of type 2 diabetes mellitus: Late 1980s        Background history:  She was previously treated with metformin prior to starting insulin She thinks she has been on insulin for at least 10 years and mostly taking Lantus and other types of insulin with it Her metformin was stopped when she started insulin and also has had renal dysfunction Generally she has had difficulty controlling her diabetes  Recent history:   INSULIN regimen is described as: Lantus 56 hs Humalog 12-15 ac meals     Previously her A1c had been consistently over 8% and occasionally higher. Because of poor control she was switched from Januvia to [place] on her initial consultation in 5/16 She had difficulties with nausea taking this and now is taking Trulicity A999333 mg weekly since about 4 weeks ago and has no side effects Her last A1c was 7.7  Current blood sugar patterns and problems identified:   She is periodically forgetting to take her insulin before supper when she is eating out or otherwise, mostly in the evenings  She is taking her insulin after she is eating as she is afraid of hypoglycemia  Her blood sugars are still relatively good in the mornings but appear to be progressively higher as the day goes on  Her highest blood sugars are after supper and occasionally over 300  He will know she is trying to eat smaller meals generally and not too much carbohydrate she tends to have high readings after eating  She is trying to adjust her insulin somewhat based on meal size and carbohydrate intake still has mostly high readings after supper  On average her monitor download indicates higher readings compared to her last visit     Oral hypoglycemic drugs the patient is  taking are: None Side effects from medications have been: None  Compliance with the medical regimen: Fair Hypoglycemia: None  Sporadically, may occur in the morning or suppertime    Glucose monitoring:  done about 3 times a day         Glucometer:  Accu-Chek [employer]iva Blood Glucose readings by meter download.  Mean values apply above for all meters except median for One Touch  PRE-MEAL Fasting Lunch Dinner Bedtime Overall  Glucose range: 66-244  192   114-251   132-473    Mean/median:  139     264  184    Self-care: The diet that the patient has been following is: tries to limit fats and carbs.  Eating out a lot She uses diet green tea and no sugar in drinks   Dinner 6-7 PM Typical meal intake: Breakfast is eggs              Dietician visit, most recent: 5/16               Exercise: Water exercises 3/7 days in 9/16  Weight history:  Wt Readings from Last 3 Encounters:  03/09/15 198 lb (89.812 kg)  03/04/15 198 lb (89.812 kg)  01/05/15 199 lb (90.266 kg)    Glycemic control:      Lab Results  Component Value Date   HGBA1C 7.7 01/05/2015   HGBA1C  9.9* 06/15/2013   HGBA1C * 04/01/2008    8.3 (NOTE)   The ADA recommends the following therapeutic goal for glycemic   control related to Hgb A1C measurement:   Goal of Therapy:   < 7.0% Hgb A1C   Reference: American Diabetes Association: Clinical Practice   Recommendations 2008, Diabetes Care,  2008, 31:(Suppl 1).   Lab Results  Component Value Date   MICROALBUR 32.7* 01/05/2015   LDLCALC 49 01/05/2015   CREATININE 1.77* 01/05/2015         Medication List       This list is accurate as of: 03/09/15  4:11 PM.  Always use your most recent med list.               albuterol (2.5 MG/3ML) 0.083% nebulizer solution  Commonly known as:  PROVENTIL  Take 3 mLs (2.5 mg total) by nebulization every 6 (six) hours as needed for wheezing or shortness of breath.     VENTOLIN HFA 108 (90 BASE) MCG/ACT inhaler  Generic drug:   albuterol  INHALE TWO PUFFS INTO LUNGS EVERY 4 HOURS AS NEEDED     ALPRAZolam 0.25 MG tablet  Commonly known as:  XANAX  Take 1 tablet (0.25 mg total) by mouth at bedtime as needed for sleep.     aspirin 81 MG EC tablet  Take 1 tablet (81 mg total) by mouth daily.     budesonide-formoterol 160-4.5 MCG/ACT inhaler  Commonly known as:  SYMBICORT  Inhale 2 puffs into the lungs 2 (two) times daily.     clopidogrel 75 MG tablet  Commonly known as:  PLAVIX  Take 75 mg by mouth daily.     CRESTOR 40 MG tablet  Generic drug:  rosuvastatin  TAKE ONE TABLET BY MOUTH ONCE DAILY     Dulaglutide 0.75 MG/0.5ML Sopn  Commonly known as:  TRULICITY  Inject the contents of one pen once a week     furosemide 40 MG tablet  Commonly known as:  LASIX  Take 0.5 tablets (20 mg total) by mouth 2 (two) times daily.     insulin lispro 100 UNIT/ML KiwkPen  Commonly known as:  HUMALOG KWIKPEN  Inject 10-20 units three times a day.     LANTUS SOLOSTAR 100 UNIT/ML Solostar Pen  Generic drug:  Insulin Glargine  Inject 56 Units as directed at bedtime.     levocetirizine 5 MG tablet  Commonly known as:  XYZAL  Take 1 tablet by mouth daily.     metoprolol succinate 100 MG 24 hr tablet  Commonly known as:  TOPROL-XL  Take 1 tablet (100 mg total) by mouth 2 (two) times daily. Take with or immediately following a meal.     traMADol 50 MG tablet  Commonly known as:  ULTRAM  Take 50 mg by mouth every 8 (eight) hours as needed for moderate pain.     ZETIA 10 MG tablet  Generic drug:  ezetimibe  TAKE ONE TABLET BY MOUTH ONCE DAILY        Allergies:  Allergies  Allergen Reactions  . Imdur [Isosorbide Dinitrate] Other (See Comments)    Headache/ severe hypotension  . Amoxicillin Rash  . Tetracycline Rash    Past Medical History  Diagnosis Date  . Hypertension   . Hyperlipidemia   . CHF (congestive heart failure)     EF is low normal at 50 to 55% per echo in Jan. 2011  . Renal  insufficiency, mild   . Nonischemic cardiomyopathy   .  Anemia   . Iron deficiency anemia   . S/P AVR     prosthesis valve placement 2009 at same time re-do CABG  . Hearing loss of right ear   . PAF (paroxysmal atrial fibrillation)   . Bilateral carotid artery stenosis     Bilateral ICA 40-59%/  >50% LECA  . Dyspnea   . CAD (coronary artery disease) cardiologist-  dr Aundra Dubin    Remote CABG in 1998 with redo in 2009  . Type 2 diabetes mellitus     monitored by dr Dwyane Dee  . Renal calculus, right   . History of non-ST elevation myocardial infarction (NSTEMI)     Sept 2014--  thought to be type II HTN w/ LHC without infarct related artery and patent grafts  . PONV (postoperative nausea and vomiting)     pt states patch worked well before  . Stroke     residual rt hearing loss  . Moderate persistent asthma     pulmologist-  Dr. Malvin Johns  . GERD (gastroesophageal reflux disease)   . Nausea & vomiting     related to stone  . Abnormally small mouth   . Bruises easily   . Psoriasis     right leg    Past Surgical History  Procedure Laterality Date  . Aortic valve replacement  2009    #39mm [facility] Ease pericardial valve (done same time is CABG)  . Left heart catheterization with coronary/graft angiogram N/A 02/23/2013    Procedure: LEFT HEART CATHETERIZATION WITH Beatrix Fetters;  Surgeon: Blane Ohara, MD;  Location: [facility] CATH LAB;  Service: Cardiovascular;  Laterality: N/A;  . Coronary artery bypass graft  1998 &  re-do 2009    Had LIMA to DX/LAD, SVG to 2 marginal branches and SVG to [facility] originally; SVG to 3rd OM and PD at time of redo  . Cardiac catheterization  03/23/2008    Pre-redo CABG: L main OK, LAD (T), CFX (T), OM1 99%, RCA (T), LIMA-LAD OK, SVG-OM(?3) OK w/ little florw to OM2, SVG-RCA OK. EF NL  . Tubal ligation    . Tonsillectomy    . Carpal tunnel release    . Transthoracic echocardiogram  02-24-2013       mild LVH,  ef 50-55%/  AV bioprosthesis was  present with very mild stenosis and no regurg., mean grandient 44mmHg, peak grandient 28mmHg /  mild MR/  mild LAE and RAE/  moderate TR  . Cardiovascular stress test  06-15-2013      dr Debara Pickett    [facility] perfusion study/  no evidence ischemia/  normal wall motion, ef 72%  . Eye surgery Bilateral     cataracts  . Cystoscopy w/ ureteral stent placement Right 12/20/2014    Procedure: CYSTOSCOPY WITH RETROGRADE PYELOGRAM/URETERAL STENT PLACEMENT;  Surgeon: Cleon Gustin, MD;  Location: [facility];  Service: Urology;  Laterality: Right;  . Holmium laser application Right [id number]    Procedure:  HOLMIUM LASER LITHOTRIPSY;  Surgeon: Cleon Gustin, MD;  Location: [facility];  Service: Urology;  Laterality: Right;    Family History  Problem Relation Age of Onset  . Heart disease Mother   . Heart disease Father   . Heart failure Father   . Diabetes Son   . Diabetes Maternal Grandmother     Social History:  reports that she has never smoked. She has never used smokeless tobacco. She reports that she does not drink alcohol or use illicit drugs.  Review of Systems    Lipid history: On 40 mg Crestor along with Zetia since she has had her coronary artery disease diagnosis    Lab Results  Component Value Date   CHOL 108 01/05/2015   HDL 27.30* 01/05/2015   LDLCALC 49 01/05/2015   LDLDIRECT 109.2 03/01/2014   TRIG 161.0* 01/05/2015   CHOLHDL 4 01/05/2015           Eyes:   Most recent eye exam was 2016 and was not told to have any retinopathy  Hypertension: Blood pressure is better on this visit, Norvasc was stopped in the last visit because of low normal blood pressure Home blood pressure readings have been monitored  Diabetic foot exam in 5/16 shows She has complete loss of monofilament sensation in her distal feet with only mild sensitivity present on the right first and second toes  Physical Examination:  BP 130/74 mmHg  Pulse 86   Temp(Src) 98.3 F (36.8 C)  Resp 16  Ht 5\' 4"  (1.626 m)  Wt 198 lb (89.812 kg)  BMI 33.97 kg/m2  SpO2 95%    no edema  ASSESSMENT:  Diabetes type 2, uncontrolled   She has inadequate control although generally doing better with using Victoza along with her basal bolus insulin See history of present illness for detailed discussion of his current management, blood sugar patterns and problems identified She is not taking enough insulin to cover her evening meal She also appears to have progressively higher readings later in the day probably indicating inadequate duration of action of Lantus Also has some fluctuation in her blood sugars Despite her trying to watch her diet she is still requiring significant amount of mealtime coverage and has difficulty with compliance doing her insulin at mealtimes especially in the evenings when going out Currently taking insulin postprandially and does not understand need for doing it before eating  Fasting readings are generally stable recently and  not usually high   PLAN:   Recommended a trial of the V-go pump.  Discussed in detail how this works and how it could benefit her and improve her compliance as well as reduce insulin requirement.  She is willing to try this but wants to get more information on it.  She will have her authorization form signed and information given by nurse educator today  Most likely will need a 40 unit basal on the pump and she can start using boluses of 8 units at breakfast and lunch and 12 at dinner  Meanwhile she can switch Lantus to the morning without changing the dose  She can increase her suppertime coverage of Humalog at least 2 units especially on eating out  Take insulin consistently before meals  Continue Trulicity of A999333 mg, may consider higher dose but she seems to be having adequate satiety with this dose  She will need to be seen a week after starting the pump   Patient Instructions  Try lantus in  am  May need 16-18 at supper for larger meals or eating out  Must take shot before eating  Check blood sugars on waking up .Marland Kitchen3-5  .Marland Kitchen times a week Also check blood sugars about 2 hours after a meal and do this after different meals by rotation  Recommended blood sugar levels on waking up is 90-130 and about 2 hours after meal is 140-180 Please bring blood sugar monitor to each visit.    Counseling time on subjects discussed above is over 50% of today's 25 minute  visit  KUMAR,AJAY 03/09/2015, 4:11 PM   Note: This office note was prepared with Dragon voice recognition system technology. Any transcriptional errors that result from this process are unintentional.

## 2015-03-10 ENCOUNTER — Other Ambulatory Visit: Payer: Self-pay | Admitting: *Deleted

## 2015-03-10 ENCOUNTER — Telehealth: Payer: Self-pay | Admitting: Endocrinology

## 2015-03-10 MED ORDER — INSULIN LISPRO 100 UNIT/ML CARTRIDGE: SUBCUTANEOUS | Status: DC

## 2015-03-10 MED ORDER — V-GO 40 KIT: PACK | Status: DC

## 2015-03-10 NOTE — Telephone Encounter (Signed)
V-go 40 and also she will need a Humalog vial, up to 76 units a day Will need to see her a week after she starts the pump

## 2015-03-10 NOTE — Telephone Encounter (Signed)
Vgo customer care linda calling giving information she is covered the pharmacy can dispense 30 day rx for a copay of $7.40

## 2015-03-10 NOTE — Telephone Encounter (Signed)
Noted, patient is aware, rx sent 

## 2015-03-10 NOTE — Telephone Encounter (Signed)
Please see below and advise what size you want sent in please.

## 2015-03-15 ENCOUNTER — Encounter: Payer: Medicare Other | Attending: Endocrinology | Admitting: Nutrition

## 2015-03-15 ENCOUNTER — Other Ambulatory Visit: Payer: Self-pay | Admitting: *Deleted

## 2015-03-15 DIAGNOSIS — Z794 Long term (current) use of insulin: Secondary | ICD-10-CM | POA: Diagnosis not present

## 2015-03-15 DIAGNOSIS — E119 Type 2 diabetes mellitus without complications: Secondary | ICD-10-CM | POA: Diagnosis present

## 2015-03-15 DIAGNOSIS — Z713 Dietary counseling and surveillance: Secondary | ICD-10-CM | POA: Insufficient documentation

## 2015-03-15 MED ORDER — INSULIN LISPRO 100 UNIT/ML ~~LOC~~ SOLN: SUBCUTANEOUS | Status: DC

## 2015-03-15 NOTE — Patient Instructions (Signed)
Apply a V-Go tonight at [facility]. Take no more Lantus insulin Give 8u (4 clicks) before breakfast and lunch, and A999333 (6 clicks) before supper.   Test blood sugars before meals and at bedtime, and call the results to Dr. Ronnie Derby office on Friday. Call sooner if blood sugars are dropping.

## 2015-03-15 NOTE — Progress Notes (Signed)
Patient was instructed on how to fill, apply and use the V-go .she filled a V-go 40 as shown with Humalog insulin, with very little assistance from me.  She did not attach it because she took her Lantus last night at 8PM.  She will attach it at [facility]. She reverbalized that she will take no more Lantus insulin. She was taking a base of 10u of Humalog, and then 1 extra unit for 15 grams of carbs.  We discussed that she will probably need less insullin for meal time coverage, and was told to take 8u ac B and acL, and 12u acS.  Written instructions were given for this.   She was given a V-go 40 starter kit with telephone number in case there are any questions.   She was given a sheet of paper to write her blood sugars down, and she was told to test ac and HS.  She reported good understanding of this.  She will call the office on Friday with blood sugar readings, and see Dr. Dwyane Dee on Monday of next week.   She had no final questions.

## 2015-03-17 ENCOUNTER — Encounter: Payer: Self-pay | Admitting: Internal Medicine

## 2015-03-17 ENCOUNTER — Ambulatory Visit (INDEPENDENT_AMBULATORY_CARE_PROVIDER_SITE_OTHER): Payer: Medicare Other | Admitting: Internal Medicine

## 2015-03-17 VITALS — BP 106/54 | HR 84 | Ht 65.0 in | Wt 200.0 lb

## 2015-03-17 DIAGNOSIS — I480 Paroxysmal atrial fibrillation: Secondary | ICD-10-CM | POA: Diagnosis not present

## 2015-03-17 DIAGNOSIS — I5032 Chronic diastolic (congestive) heart failure: Secondary | ICD-10-CM

## 2015-03-17 DIAGNOSIS — I10 Essential (primary) hypertension: Secondary | ICD-10-CM | POA: Diagnosis not present

## 2015-03-17 DIAGNOSIS — I471 Supraventricular tachycardia: Secondary | ICD-10-CM | POA: Diagnosis not present

## 2015-03-17 DIAGNOSIS — I4719 Other supraventricular tachycardia: Secondary | ICD-10-CM

## 2015-03-17 NOTE — Assessment & Plan Note (Signed)
Her symptoms are class 2. She is sedentary. She will continue her current meds and maintain a low sodium diet.

## 2015-03-17 NOTE — Assessment & Plan Note (Signed)
There was no evidence of this on her cardiac monitor. I would recommend watchful waiting.

## 2015-03-17 NOTE — Assessment & Plan Note (Signed)
I have discussed the likely benign nature of her problem. She has very minimal palpitations. I doubt atrial flutter. I would not anti-coagulate. Her monitor demonstrates that her episodes are short lived. Will follow.

## 2015-03-17 NOTE — Patient Instructions (Signed)
Medication Instructions:  Your physician recommends that you continue on your current medications as directed. Please refer to the Current Medication list given to you today.   Labwork: None ordered   Testing/Procedures: None ordered   Follow-Up: Your physician recommends that you schedule a follow-up appointment as needed    Any Other Special Instructions Will Be Listed Below (If Applicable).

## 2015-03-17 NOTE — Progress Notes (Signed)
HPI Hannah [name] is referred today by Dr. Aundra Dubin for evaluation of SVT vs atrial flutter. She is a pleasant 69 yo woman with an extensive cardiac history notable for prior CABG and AVR. She has been bothered by palpitations which may last several minutes, start and stop suddenly and are associated with weakness. No syncope. She wore a cardiac monitor which demonstrated atrial tachy vs atypical flutter. No long pauses. No VT. She has been on metoprolol. She notes that she feels anxious and lightheaded when she has her symptoms.  Allergies  Allergen Reactions  . Imdur [Isosorbide Dinitrate] Other (See Comments)    Headache/ severe hypotension  . Amoxicillin Rash  . Tetracycline Rash     Current Outpatient Prescriptions  Medication Sig Dispense Refill  . albuterol (PROVENTIL) (2.5 MG/3ML) 0.083% nebulizer solution Take 3 mLs (2.5 mg total) by nebulization every 6 (six) hours as needed for wheezing or shortness of breath. 180 mL 0  . ALPRAZolam (XANAX) 0.25 MG tablet Take 1 tablet (0.25 mg total) by mouth at bedtime as needed for sleep. 30 tablet 0  . aspirin EC 81 MG EC tablet Take 1 tablet (81 mg total) by mouth daily.    . budesonide-formoterol (SYMBICORT) 160-4.5 MCG/ACT inhaler Inhale 2 puffs into the lungs 2 (two) times daily. 1 Inhaler 6  . clopidogrel (PLAVIX) 75 MG tablet Take 75 mg by mouth daily.    . CRESTOR 40 MG tablet TAKE ONE TABLET BY MOUTH ONCE DAILY 30 tablet 2  . Dulaglutide (TRULICITY) 6.64 CR/0.3IQ SOPN Inject the contents of one pen once a week 2 mL 2  . furosemide (LASIX) 40 MG tablet Take 0.5 tablets (20 mg total) by mouth 2 (two) times daily. 30 tablet 5  . Insulin Disposable Pump (V-GO 40) KIT Use 1 per day 1 kit 1  . insulin lispro (HUMALOG) 100 UNIT/ML injection Use 78 units per day with V-go pump 30 mL 2  . LANTUS SOLOSTAR 100 UNIT/ML Solostar Pen Inject 56 Units as directed at bedtime.     Marland Kitchen levocetirizine (XYZAL) 5 MG tablet Take 1 tablet by mouth daily.     . metoprolol succinate (TOPROL-XL) 100 MG 24 hr tablet Take 1 tablet (100 mg total) by mouth 2 (two) times daily. Take with or immediately following a meal. 180 tablet 1  . traMADol (ULTRAM) 50 MG tablet Take 50 mg by mouth every 8 (eight) hours as needed for moderate pain.     . VENTOLIN HFA 108 (90 BASE) MCG/ACT inhaler INHALE TWO PUFFS INTO LUNGS EVERY 4 HOURS AS NEEDED 18 each 11  . ZETIA 10 MG tablet TAKE ONE TABLET BY MOUTH ONCE DAILY 30 tablet 3   No current facility-administered medications for this visit.     Past Medical History  Diagnosis Date  . Hypertension   . Hyperlipidemia   . CHF (congestive heart failure) (HCC)     EF is low normal at 50 to 55% per echo in Jan. 2011  . Renal insufficiency, mild   . Nonischemic cardiomyopathy ([place])   . Anemia   . Iron deficiency anemia   . S/P AVR     prosthesis valve placement 2009 at same time re-do CABG  . Hearing loss of right ear   . PAF (paroxysmal atrial fibrillation) ([place])   . Bilateral carotid artery stenosis     Bilateral ICA 40-59%/  >50% LECA  . Dyspnea   . CAD (coronary artery disease) cardiologist-  dr Aundra Dubin  Remote CABG in 1998 with redo in 2009  . Type 2 diabetes mellitus ([place])     monitored by dr Dwyane Dee  . Renal calculus, right   . History of non-ST elevation myocardial infarction (NSTEMI)     Sept 2014--  thought to be type II HTN w/ LHC without infarct related artery and patent grafts  . PONV (postoperative nausea and vomiting)     pt states patch worked well before  . Stroke [facility])     residual rt hearing loss  . Moderate persistent asthma     pulmologist-  Dr. Malvin Johns  . GERD (gastroesophageal reflux disease)   . Nausea & vomiting     related to stone  . Abnormally small mouth   . Bruises easily   . Psoriasis     right leg    ROS:   All systems reviewed and negative except as noted in the HPI.   Past Surgical History  Procedure Laterality Date  . [facility]ortic [facility]  2009    [facility]  . Left heart catheterization with coronary/graft angiogram N/A 02/23/2013    Procedure: LEFT HEART CATHETERIZATION WITH CBeatrix Fetters  Surgeon: MBlane Ohara MD;  [facility];  Service: Cardiovascular;  Laterality: N/A;  . Coronary artery bypass graft  1998 &  re-do 2009    Had LIMA to DX/LAD, [facility]; SVG to 3rd OM and PD at time of redo  . Cardiac catheterization  03/23/2008    Pre-redo CABG: L main OK, LAD (T), CFX (T), OM1 99%, RCA (T), LIMA-LAD OK, SVG-OM(?3) OK w/ little florw to OM2, SVG-RCA OK. EF NL  . Tubal ligation    . Tonsillectomy    . Carpal tunnel release    . Transthoracic echocardiogram  02-24-2013       mild LVH,  ef 50-55%/  AV bioprosthesis was present with very mild stenosis and no regurg., mean grandient 147mg, peak grandient 2016m /  mild MR/  mild LAE and RAE/  moderate TR  . Cardiovascular stress test  06-15-2013      dr hilDebara Pickett [place] Adamrfusion study/  no evidence ischemia/  normal wall motion, ef 72%  . Eye surgery Bilateral     cataracts  . Cystoscopy w/ ureteral stent placement Right 12/20/2014    Procedure: CYSTOSCOPY WITH RETROGRADE PYELOGRAM/URETERAL STENT PLACEMENT;  Surgeon: PatCleon GustinD;  [facility];  Laterality: Right;  . Holmium laser application Right 12/27/96/3382 Procedure:  HOLMIUM LASER LITHOTRIPSY;  Surgeon: PatCleon GustinD;  [facility];  Laterality: Right;     Family History  Problem Relation Age of Onset  . Heart disease Mother   . Heart disease Father   . Heart failure Father   . Diabetes Son   . Diabetes Maternal Grandmother      Social History   Social History  . Marital Status: Married    Spouse Name: N/A  . Number of Children: N/A  . Years of Education: N/A   Occupational History  . Not  on file.   Social History Main Topics  . Smoking status: Never Smoker   . Smokeless tobacco: Never Used  . Alcohol Use: No  . Drug Use: No  . Sexual Activity: Yes    Birth Control/ Protection: None  Other Topics Concern  . Not on file   Social History Narrative     BP 106/54 mmHg  Pulse 84  Ht 5' 5" (1.651 m)  Wt 200 lb (90.719 kg)  BMI 33.28 kg/m2  Physical Exam:  Well appearing 69 yo woman, NAD HEENT: Unremarkable Neck:  No JVD, no thyromegally Lymphatics:  No adenopathy Back:  No CVA tenderness Lungs:  Clear with no wheezes,rales, or rhonchi HEART:  Regular rate rhythm, no murmurs, no rubs, no clicks Abd:  soft, positive bowel sounds, no organomegally, no rebound, no guarding Ext:  2 plus pulses, no edema, no cyanosis, no clubbing Skin:  No rashes no nodules Neuro:  CN II through XII intact, motor grossly intact  EKG  Cardiac monitor - nsr/sinus brady/sinus tachy/ atrial tachycardia, non-sustained. Doubt atrial flutter  Assess/Plan:

## 2015-03-17 NOTE — Assessment & Plan Note (Signed)
Her blood pressure is well controlled. Will follow.  

## 2015-03-18 ENCOUNTER — Telehealth: Payer: Self-pay | Admitting: Endocrinology

## 2015-03-18 NOTE — Telephone Encounter (Signed)
Pt calling to give update since pump start

## 2015-03-24 ENCOUNTER — Encounter: Payer: Self-pay | Admitting: Endocrinology

## 2015-03-24 ENCOUNTER — Ambulatory Visit (INDEPENDENT_AMBULATORY_CARE_PROVIDER_SITE_OTHER): Payer: Medicare Other | Admitting: Endocrinology

## 2015-03-24 VITALS — BP 110/70 | HR 100 | Temp 98.2°F | Resp 14 | Ht 65.0 in | Wt 199.0 lb

## 2015-03-24 DIAGNOSIS — Z794 Long term (current) use of insulin: Secondary | ICD-10-CM

## 2015-03-24 DIAGNOSIS — E1165 Type 2 diabetes mellitus with hyperglycemia: Secondary | ICD-10-CM | POA: Diagnosis not present

## 2015-03-24 NOTE — Progress Notes (Signed)
During Patient ID: Hannah [name], female   DOB: 1945-06-14, 69 y.o.   MRN: [id number]           Reason for Appointment: Follow-up for Type 2 Diabetes  Referring physician: Suzy Bouchard  History of Present Illness:          Date of diagnosis of type 2 diabetes mellitus: Late 1980s        Background history:  She was previously treated with metformin prior to starting insulin She thinks she has been on insulin for at least 10 years and mostly taking Lantus and other types of insulin with it Her metformin was stopped when she started insulin and also has had renal dysfunction Generally she has had difficulty controlling her diabetes  Recent history:   INSULIN regimen is described as:  V-go 40 basal, mealtime coverage 8-10 u ac B and acL, and 12u-14 acS  Previously her A1c had been consistently over 8% and occasionally higher. Because of poor control she was switched from Januvia to [place] on her initial consultation in 5/16 Subsequently she has been on Trulicity because of nausea with Victoza  Her last A1c was 7.7  Current blood sugar patterns and problems identified:   She is periodically forgetting to take her insulin before supper when she is eating out or otherwise, mostly in the evenings  She is taking her insulin after she is eating as she is afraid of hypoglycemia  Her blood sugars are still relatively good in the mornings but appear to be progressively higher as the day goes on  Her highest blood sugars are after supper and occasionally over 300  He will know she is trying to eat smaller meals generally and not too much carbohydrate she tends to have high readings after eating  She is trying to adjust her insulin somewhat based on meal size and carbohydrate intake still has mostly high readings after supper  On average her monitor download indicates higher readings compared to her last visit     Oral hypoglycemic drugs the patient is taking are: None Side effects from  medications have been: None  Compliance with the medical regimen: Fair Hypoglycemia: None  Sporadically, may occur in the morning or suppertime    Glucose monitoring:  done about 3 times a day         Glucometer:  Accu-Chek Aviva Blood Glucose readings by meter download.  Mean values apply above for all meters except median for One Touch  PRE-MEAL Fasting Lunch Dinner Bedtime Overall  Glucose range:  82-145  101-177   70-133   123-181    Mean/median:        Self-care: The diet that the patient has been following is: tries to limit fats and carbs.  Eating out periodically She uses diet green tea and no sugar in drinks   Dinner 6-7 PM Typical meal intake: Breakfast is eggs              Dietician visit, most recent: 5/16               Exercise: Water exercises 3/7 days in 9/16  Weight history:  Wt Readings from Last 3 Encounters:  03/24/15 199 lb (90.266 kg)  03/17/15 200 lb (90.719 kg)  03/09/15 198 lb (89.812 kg)    Glycemic control:      Lab Results  Component Value Date   HGBA1C 7.7 01/05/2015   HGBA1C 9.9* 06/15/2013   HGBA1C * 04/01/2008    8.3 (NOTE)  The ADA recommends the following therapeutic goal for glycemic   control related to Hgb A1C measurement:   Goal of Therapy:   < 7.0% Hgb A1C   Reference: American Diabetes Association: Clinical Practice   Recommendations 2008, Diabetes Care,  2008, 31:(Suppl 1).   Lab Results  Component Value Date   MICROALBUR 32.7* 01/05/2015   LDLCALC 49 01/05/2015   CREATININE 1.77* 01/05/2015         Medication List       This list is accurate as of: 03/24/15  5:15 PM.  Always use your most recent med list.               albuterol (2.5 MG/3ML) 0.083% nebulizer solution  Commonly known as:  PROVENTIL  Take 3 mLs (2.5 mg total) by nebulization every 6 (six) hours as needed for wheezing or shortness of breath.     VENTOLIN HFA 108 (90 BASE) MCG/ACT inhaler  Generic drug:  albuterol  INHALE TWO PUFFS INTO LUNGS  EVERY 4 HOURS AS NEEDED     ALPRAZolam 0.25 MG tablet  Commonly known as:  XANAX  Take 1 tablet (0.25 mg total) by mouth at bedtime as needed for sleep.     aspirin 81 MG EC tablet  Take 1 tablet (81 mg total) by mouth daily.     budesonide-formoterol 160-4.5 MCG/ACT inhaler  Commonly known as:  SYMBICORT  Inhale 2 puffs into the lungs 2 (two) times daily.     clopidogrel 75 MG tablet  Commonly known as:  PLAVIX  Take 75 mg by mouth daily.     CRESTOR 40 MG tablet  Generic drug:  rosuvastatin  TAKE ONE TABLET BY MOUTH ONCE DAILY     Dulaglutide 0.75 MG/0.5ML Sopn  Commonly known as:  TRULICITY  Inject the contents of one pen once a week     furosemide 40 MG tablet  Commonly known as:  LASIX  Take 0.5 tablets (20 mg total) by mouth 2 (two) times daily.     insulin lispro 100 UNIT/ML injection  Commonly known as:  HUMALOG  Use 78 units per day with V-go pump     LANTUS SOLOSTAR 100 UNIT/ML Solostar Pen  Generic drug:  Insulin Glargine  Inject 56 Units as directed at bedtime.     levocetirizine 5 MG tablet  Commonly known as:  XYZAL  Take 1 tablet by mouth daily.     metoprolol succinate 100 MG 24 hr tablet  Commonly known as:  TOPROL-XL  Take 1 tablet (100 mg total) by mouth 2 (two) times daily. Take with or immediately following a meal.     traMADol 50 MG tablet  Commonly known as:  ULTRAM  Take 50 mg by mouth every 8 (eight) hours as needed for moderate pain.     V-GO 40 Kit  Use 1 per day     ZETIA 10 MG tablet  Generic drug:  ezetimibe  TAKE ONE TABLET BY MOUTH ONCE DAILY        Allergies:  Allergies  Allergen Reactions  . Imdur [Isosorbide Dinitrate] Other (See Comments)    Headache/ severe hypotension  . Amoxicillin Rash  . Tetracycline Rash    Past Medical History  Diagnosis Date  . Hypertension   . Hyperlipidemia   . CHF (congestive heart failure) (HCC)     EF is low normal at 50 to 55% per echo in Jan. 2011  . Renal insufficiency, mild    . Nonischemic cardiomyopathy ([place])   .  Anemia   . Iron deficiency anemia   . S/P AVR     prosthesis valve placement 2009 at same time re-do CABG  . Hearing loss of right ear   . PAF (paroxysmal atrial fibrillation) ([place])   . Bilateral carotid artery stenosis     Bilateral ICA 40-59%/  >50% LECA  . Dyspnea   . CAD (coronary artery disease) cardiologist-  dr Aundra Dubin    Remote CABG in 1998 with redo in 2009  . Type 2 diabetes mellitus ([place])     monitored by dr Dwyane Dee  . Renal calculus, right   . History of non-ST elevation myocardial infarction (NSTEMI)     Sept 2014--  thought to be type II HTN w/ LHC without infarct related artery and patent grafts  . PONV (postoperative nausea and vomiting)     pt states patch worked well before  . Stroke [facility])     residual rt hearing loss  . Moderate persistent asthma     pulmologist-  Dr. Malvin Johns  . GERD (gastroesophageal reflux disease)   . Nausea & vomiting     related to stone  . Abnormally small mouth   . Bruises easily   . Psoriasis     right leg    Past Surgical History  Procedure Laterality Date  . Aortic valve replacement  2009    [facility]done same time is CABG)  . Left heart catheterization with coronary/graft angiogram N/A 02/23/2013    Procedure: LEFT HEART CATHETERIZATION WITH CBeatrix Fetters  Surgeon: MBlane Ohara MD;  [facility];  Service: Cardiovascular;  Laterality: N/A;  . Coronary artery bypass graft  1998 &  re-do 2009    Had LIMA to DX/LAD, SVG [facility] 2 marginal branches [facility]; SVG to 3rd OM and PD at time of redo  . Cardiac catheterization  03/23/2008    Pre-redo CABG: L main OK, LAD (T), CFX (T), OM1 99%, RCA (T), LIMA-LAD OK, SVG-OM(?3) OK w/ little florw to OM2, SVG-RCA OK. EF NL  . Tubal ligation    . Tonsillectomy    . Carpal tunnel release    . Transthoracic echocardiogram  02-24-2013       mild LVH,  ef 50-55%/  AV bioprosthesis was  present with very mild stenosis and no regurg., mean grandient 134mg, peak grandient 2063m /  mild MR/  mild LAE and RAE/  moderate TR  . Cardiovascular stress test  06-15-2013      dr hilDebara Pickett [place] Adamrfusion study/  no evidence ischemia/  normal wall motion, ef 72%  . Eye surgery Bilateral     cataracts  . Cystoscopy w/ ureteral stent placement Right 12/20/2014    Procedure: CYSTOSCOPY WITH RETROGRADE PYELOGRAM/URETERAL STENT PLACEMENT;  Surgeon: PatCleon GustinD;  [facility];  Laterality: Right;  . Holmium laser application Right 12/26/23/3664 Procedure:  HOLMIUM LASER LITHOTRIPSY;  Surgeon: PatCleon GustinD;  [facility];  Laterality: Right;    Family History  Problem Relation Age of Onset  . Heart disease Mother   . Heart disease Father   . Heart failure Father   . Diabetes Son   . Diabetes Maternal Grandmother     Social History:  reports that she has never smoked. She has never used smokeless tobacco. She reports that she does not drink alcohol or  use illicit drugs.    Review of Systems    Lipid history: On 40 mg Crestor along with Zetia since she has had her coronary artery disease diagnosis    Lab Results  Component Value Date   CHOL 108 01/05/2015   HDL 27.30* 01/05/2015   LDLCALC 49 01/05/2015   LDLDIRECT 109.2 03/01/2014   TRIG 161.0* 01/05/2015   CHOLHDL 4 01/05/2015           Eyes:   Most recent eye exam was 2016 and was not told to have any retinopathy  Hypertension: Blood pressure is still normal, Norvasc was stopped because of low normal blood pressure  Diabetic foot exam in 5/16 shows She has complete loss of monofilament sensation in her distal feet with only mild sensitivity present on the right first and second toes  Physical Examination:  BP 110/70 mmHg  Pulse 100  Temp(Src) 98.2 F (36.8 C)  Resp 14  Ht 5' 5"  (1.651 m)  Wt 199 lb (90.266  kg)  BMI 33.12 kg/m2  SpO2 96%   ASSESSMENT:  Diabetes type 2, uncontrolled   She has much better control of her diabetes with less fluctuation with recently starting the V-go pump She is also using less insulin now with 40 units of basal compared to 56 units of Lantus Also appears to be taking less insulin for her meals overall Recent blood sugars are mostly near normal without any hypoglycemia including fasting readings She also is benefiting from continuing Trulicity 3.57 mg which helps her watch her diet  PLAN:   Continue same settings on the pump  May need an extra click pump for eating any large snack  She will continue to adjust her mealtime boluses based on meal size   There are no Patient Instructions on file for this visit.  Gaye Scorza 03/24/2015, 5:15 PM   Note: This office note was prepared with Dragon voice recognition system technology. Any transcriptional errors that result from this process are unintentional.

## 2015-03-31 ENCOUNTER — Ambulatory Visit: Payer: Medicare Other | Admitting: Endocrinology

## 2015-04-14 ENCOUNTER — Other Ambulatory Visit: Payer: Self-pay | Admitting: Cardiology

## 2015-04-20 ENCOUNTER — Ambulatory Visit: Payer: Medicare Other | Admitting: Endocrinology

## 2015-04-22 ENCOUNTER — Other Ambulatory Visit: Payer: Self-pay | Admitting: Emergency Medicine

## 2015-04-22 ENCOUNTER — Other Ambulatory Visit: Payer: Self-pay | Admitting: Cardiology

## 2015-05-04 ENCOUNTER — Other Ambulatory Visit: Payer: Self-pay | Admitting: Endocrinology

## 2015-05-09 ENCOUNTER — Ambulatory Visit (HOSPITAL_COMMUNITY)
Admission: RE | Admit: 2015-05-09 | Discharge: 2015-05-09 | Disposition: A | Discharge status: Home / Self Care | Payer: Medicare Other | Source: Ambulatory Visit | Attending: Cardiology | Admitting: Cardiology

## 2015-05-09 DIAGNOSIS — I10 Essential (primary) hypertension: Secondary | ICD-10-CM | POA: Insufficient documentation

## 2015-05-09 DIAGNOSIS — E785 Hyperlipidemia, unspecified: Secondary | ICD-10-CM | POA: Insufficient documentation

## 2015-05-09 DIAGNOSIS — E119 Type 2 diabetes mellitus without complications: Secondary | ICD-10-CM | POA: Insufficient documentation

## 2015-05-09 DIAGNOSIS — I6523 Occlusion and stenosis of bilateral carotid arteries: Secondary | ICD-10-CM | POA: Diagnosis not present

## 2015-05-09 DIAGNOSIS — I739 Peripheral vascular disease, unspecified: Secondary | ICD-10-CM

## 2015-05-09 DIAGNOSIS — I779 Disorder of arteries and arterioles, unspecified: Secondary | ICD-10-CM

## 2015-05-10 ENCOUNTER — Other Ambulatory Visit: Payer: Self-pay | Admitting: Endocrinology

## 2015-05-25 IMAGING — MR MR LUMBAR SPINE W/O CM
4 of 5 series · 25 of 48 positions shown · non-contrast
Comparison: MR MACRINA SPINE W/O dated 10/13/2010

CLINICAL DATA: Worsening low back pain radiating to left ankle,
intermittent right lower extremity pain.

EXAM:
MRI LUMBAR SPINE WITHOUT CONTRAST
TECHNIQUE: Multiplanar, multisequence MR imaging was performed. No intravenous
contrast was administered.

[Series 4: T1 · sagittal · 4.0mm · 0.55mm/px · 6 of 12 slices shown (1 of 2)]
[im 1/12]
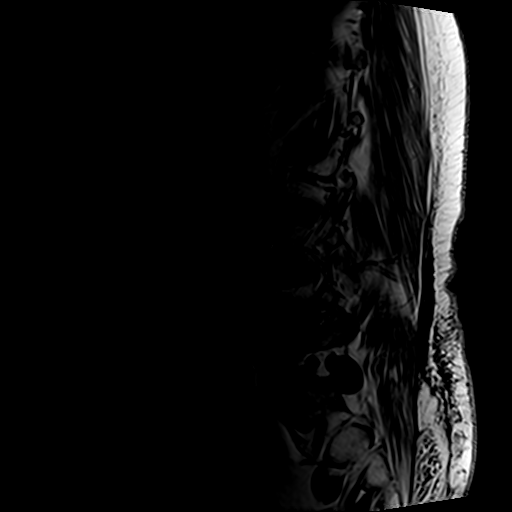
[im 3/12]
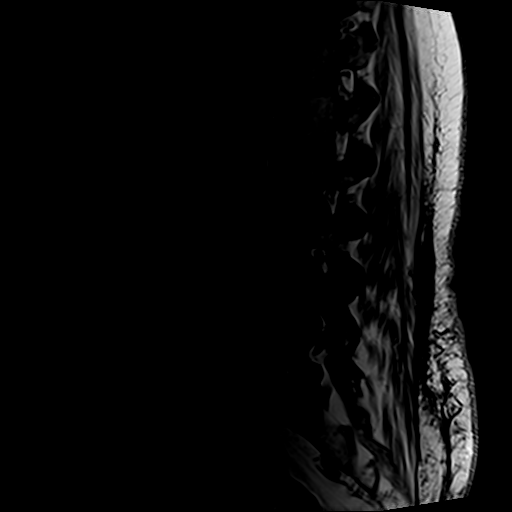
[im 5/12]
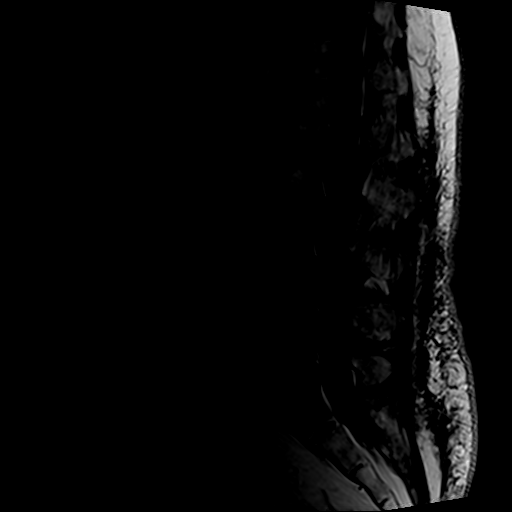
[im 7/12]
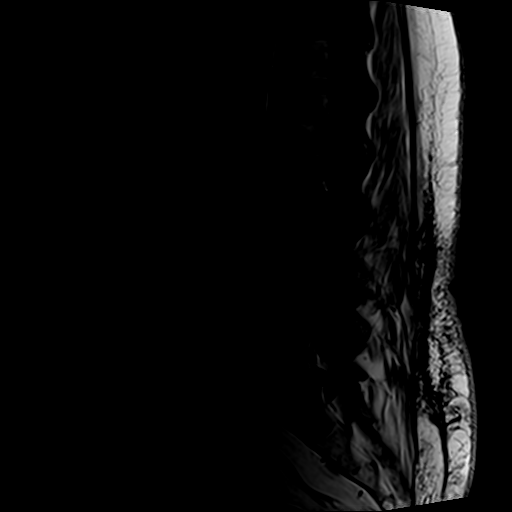
[im 9/12]
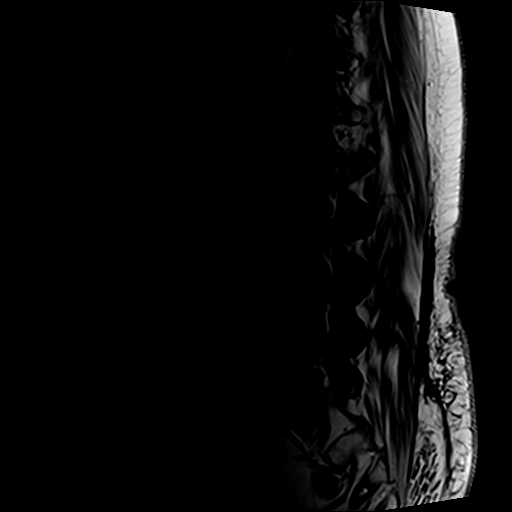
[im 12/12]
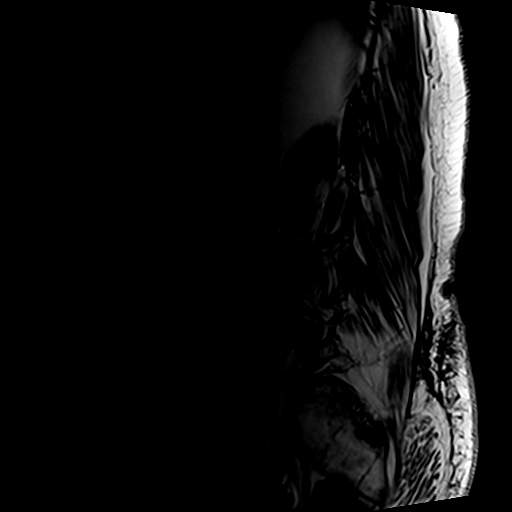

[Series 5: T2 post-contrast · sagittal · 4.0mm · 0.55mm/px · 6 of 12 slices shown]
[im 1/12]
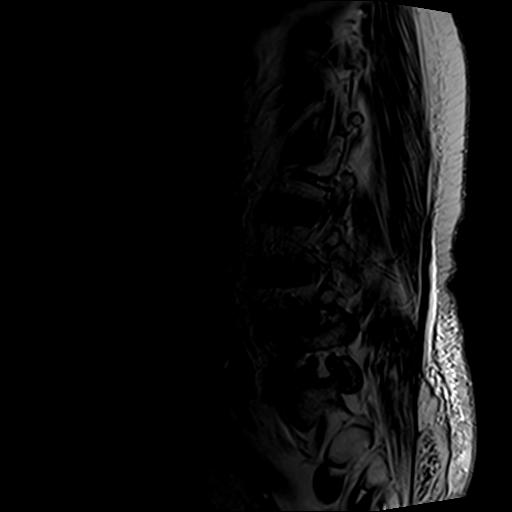
[im 3/12]
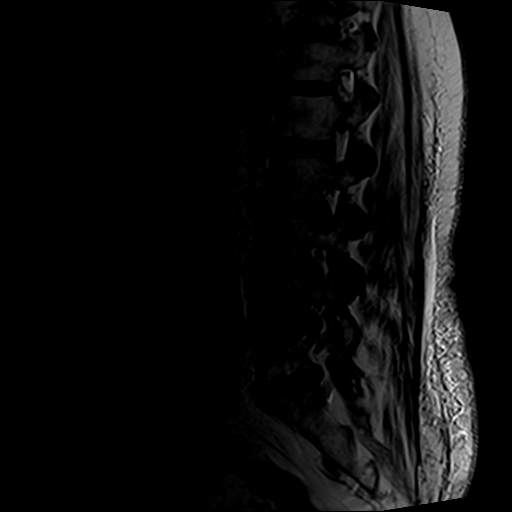
[im 5/12]
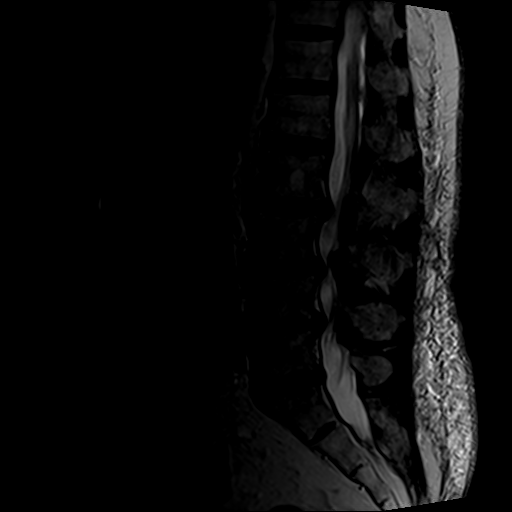
[im 7/12]
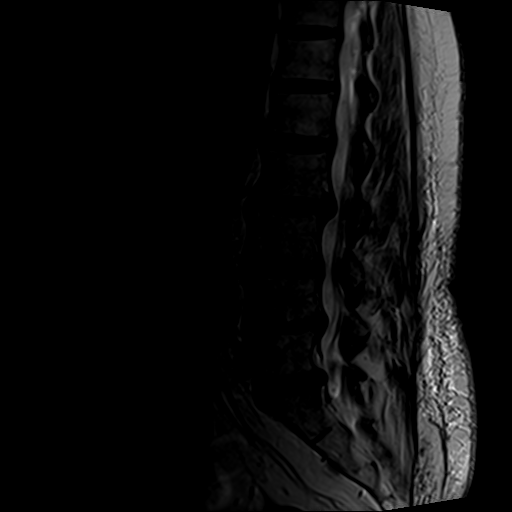
[im 9/12]
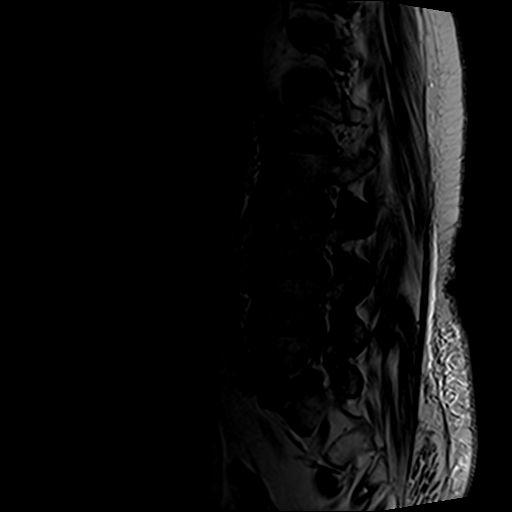
[im 12/12]
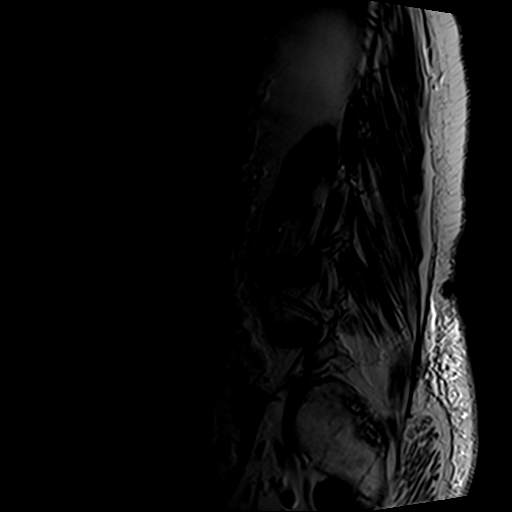

[Series 6: T2 · axial · 4.0mm · 0.70mm/px · z∈[-13,+147]mm · 9 of 29 slices shown]
[im 1/29]
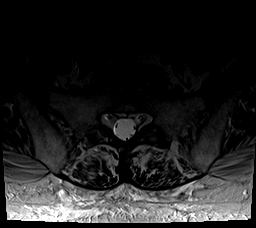
[im 5/29]
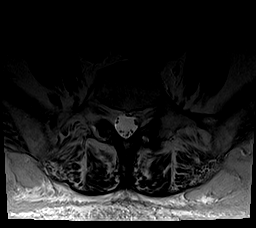
[im 9/29]
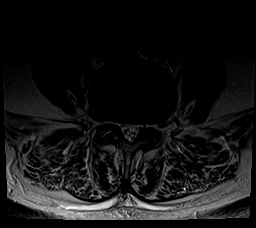
[im 13/29]
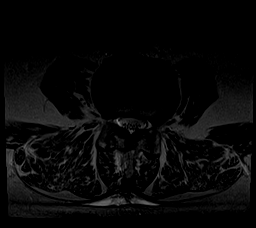
[im 15/29]
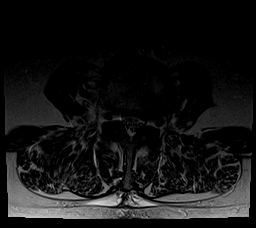
[im 17/29]
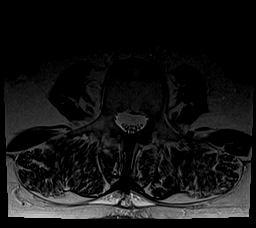
[im 21/29]
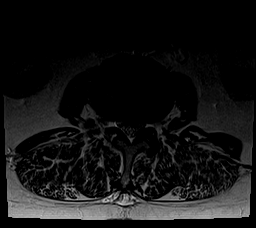
[im 25/29]
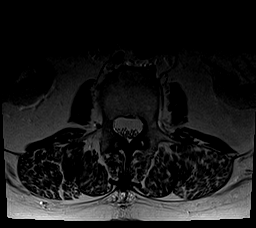
[im 29/29]
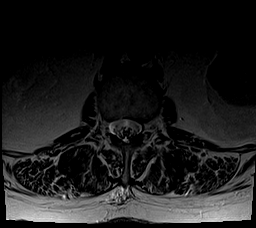

[Series 7: T1 · axial · 4.0mm · 0.35mm/px · z∈[-13,+127]mm · 4 of 29 slices shown (2 of 2)]
[im 1/29]
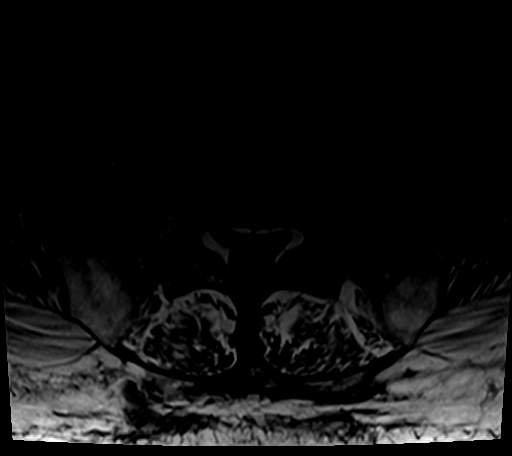
[im 5/29]
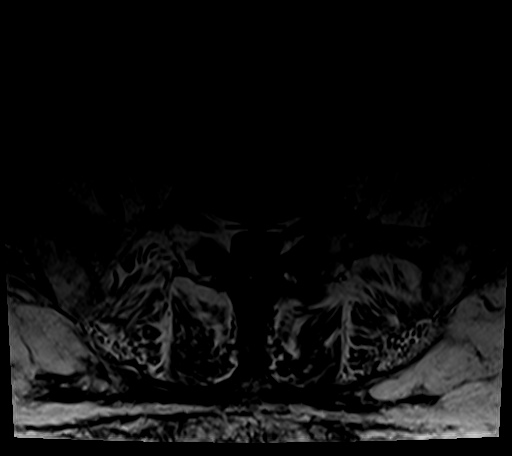
[im 15/29]
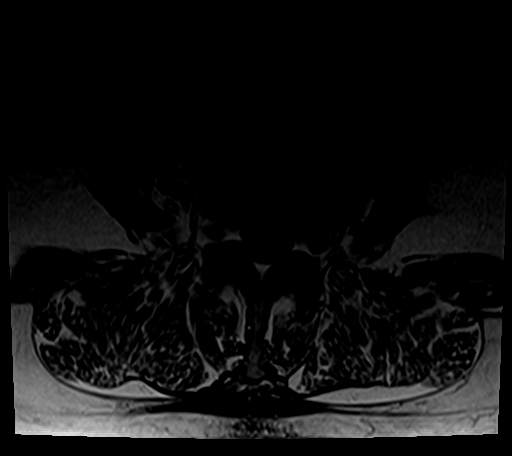
[im 25/29]
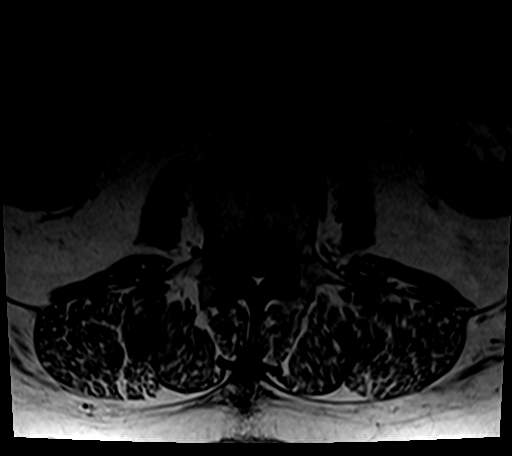

[25 of 48 positions shown; findings below may reference images not displayed]

FINDINGS: Lumbar vertebral bodies and posterior elements are intact and
aligned with straightened lumbar lordosis. Mild levoscoliosis
inferred on the axial images. Using the reference level of the last
well-formed intervertebral disc as L5-S1, moderate to severe L4-5
disc height loss, similar. Mild L2-3, L3-4 and L 5 S1 disc height
loss with decreased T2 signal within all lumbar discs most
consistent with mild desiccation. Moderate to severe acute on
chronic discogenic endplate change at L4-5, advanced from prior.
Mild to moderate chronic discogenic endplate changes L2-3, L3-4 and
L5-S1. No suspicious STIR signal to this suggests fracture.

Subcentimeter low signal presumed sclerotic focus within the
inferior endplate of T12 again seen. Stable L2 presumed hemangioma.
Conus medullaris terminates at L1-2 and appears normal morphology
and signal characteristics. Cauda equina is unremarkable. Moderate
to severe symmetric paraspinal muscle atrophy. Included prevertebral
soft tissues are nonsuspicious.

Level by level evaluation:

L1-2: Minimal annular bulging, no canal stenosis or neural foraminal
narrowing.

L2-3: Central approximate 4 x 4 mm disc extrusion with 12 mm of
contiguous superior inferior extent, slight interval healing from
prior examination. Mild facet arthropathy and ligamentum flavum
redundancy. Mild to moderate canal stenosis. Mild bilateral caudal
neural foraminal narrowing.

L3-4: 4 mm broad-based disc bulge. Moderate to severe facet
arthropathy and ligamentum flavum redundancy. Moderate canal
stenosis. Mild to moderate bilateral neural foraminal narrowing.

L4-5: 6 mm broad-based disc bulge asymmetric to the right may
encroach upon the exited right L 4 nerve, similar. Moderate to
severe facet arthropathy and ligamentum flavum redundancy. Mild
canal stenosis. Severe right, mild left neural foraminal narrowing
is similar.

L5-S1: 5 mm broad-based disc bulge, with left subarticular annular
fissure. Moderate facet arthropathy and ligamentum flavum redundancy
without canal stenosis. Mild to moderate right, moderate left neural
foraminal narrowing.
IMPRESSION: Degenerative change of lumbar spine with straightened lumbar
lordosis. No acute fracture nor malalignment.

Slight interval healing of L2-3 disc extrusion.

Moderate canal stenosis at L3-4, mild to moderate L2-3 and mild at
L4-5. Neural foraminal narrowing L2-3 to L5-S1: Severe on the right
at L4-5, similar.

  By: Renia Sultana

## 2015-05-26 ENCOUNTER — Telehealth: Payer: Self-pay | Admitting: Endocrinology

## 2015-05-26 NOTE — Telephone Encounter (Signed)
Please see below, she said this happens when she first start's her V-go.

## 2015-05-26 NOTE — Telephone Encounter (Signed)
Yes, that can happen.  Would also recommend calling the V-go support hotline to make sure she is doing everything right

## 2015-05-26 NOTE — Telephone Encounter (Signed)
Noted, patient is aware and will call the support hotline.

## 2015-05-26 NOTE — Telephone Encounter (Signed)
Patient called stating that she would like to speak with Dr. Ronnie Derby medical assistant regarding her pump  She states that when the insulin goes in it burns; is this normal?    Please advise   Thank you

## 2015-06-08 ENCOUNTER — Other Ambulatory Visit: Payer: Self-pay | Admitting: Endocrinology

## 2015-06-08 ENCOUNTER — Other Ambulatory Visit: Payer: Self-pay | Admitting: Cardiology

## 2015-06-12 DIAGNOSIS — Z923 Personal history of irradiation: Secondary | ICD-10-CM

## 2015-06-12 DIAGNOSIS — Z9221 Personal history of antineoplastic chemotherapy: Secondary | ICD-10-CM

## 2015-06-12 HISTORY — DX: Personal history of irradiation: Z92.3

## 2015-06-12 HISTORY — DX: Personal history of antineoplastic chemotherapy: Z92.21

## 2015-06-23 ENCOUNTER — Other Ambulatory Visit: Payer: Self-pay

## 2015-06-23 ENCOUNTER — Encounter: Payer: Self-pay | Admitting: Endocrinology

## 2015-06-23 ENCOUNTER — Ambulatory Visit (INDEPENDENT_AMBULATORY_CARE_PROVIDER_SITE_OTHER): Payer: Medicare Other | Admitting: Endocrinology

## 2015-06-23 VITALS — BP 122/64 | HR 88 | Temp 98.4°F | Wt 201.0 lb

## 2015-06-23 DIAGNOSIS — Z794 Long term (current) use of insulin: Secondary | ICD-10-CM

## 2015-06-23 DIAGNOSIS — N289 Disorder of kidney and ureter, unspecified: Secondary | ICD-10-CM

## 2015-06-23 DIAGNOSIS — E1165 Type 2 diabetes mellitus with hyperglycemia: Secondary | ICD-10-CM

## 2015-06-23 DIAGNOSIS — IMO0002 Reserved for concepts with insufficient information to code with codable children: Secondary | ICD-10-CM

## 2015-06-23 LAB — BASIC METABOLIC PANEL
BUN: 34 mg/dL — ABNORMAL HIGH (ref 6–23)
CO2: 32 mEq/L (ref 19–32)
Calcium: 9.5 mg/dL (ref 8.4–10.5)
Chloride: 102 mEq/L (ref 96–112)
Creatinine, Ser: 1.52 mg/dL — ABNORMAL HIGH (ref 0.40–1.20)
GFR: 35.96 mL/min — ABNORMAL LOW (ref >60.00)
Glucose, Bld: 159 mg/dL — ABNORMAL HIGH (ref 70–99)
Potassium: 4.5 mEq/L (ref 3.5–5.1)
Sodium: 139 mEq/L (ref 135–145)

## 2015-06-23 LAB — POCT GLYCOSYLATED HEMOGLOBIN (HGB A1C): Hemoglobin A1C: 7.2

## 2015-06-23 MED ORDER — INSULIN LISPRO 100 UNIT/ML ~~LOC~~ SOLN: SUBCUTANEOUS | Status: DC

## 2015-06-23 NOTE — Patient Instructions (Signed)
Use Humalog pen for dinner dose, usually 2 units more  Check blood sugars on waking up  3 times a week Also check blood sugars about 2 hours after a meal and do this after different meals by rotation  Recommended blood sugar levels on waking up is 90-130 and about 2 hours after meal is 130-160  Please bring your blood sugar monitor to each visit, thank you

## 2015-06-23 NOTE — Progress Notes (Signed)
During Patient ID: Hannah [name], female   DOB: [birth year], 70 y.o.   MRN: [id number]           Reason for Appointment: Follow-up for Type 2 Diabetes  Referring physician: Suzy Bouchard  History of Present Illness:          Date of diagnosis of type 2 diabetes mellitus: Late 1980s        Background history:  She was previously treated with metformin prior to starting insulin She thinks she has been on insulin for at least 10 years and mostly taking Lantus and other types of insulin with it Her metformin was stopped when she started insulin and also has had renal dysfunction Generally she has had difficulty controlling her diabetes  Recent history:   INSULIN regimen is described as:  V-go 40 basal, mealtime coverage 8-10 u ac B and acL, and 12u-14 acS  Previously her A1c had been consistently over 8% and occasionally higher. Because of poor control she was switched from Januvia to [place] on her initial consultation in 5/16 Subsequently she has been on Trulicity because of nausea with Victoza    Now with the V-go pump her A1c is down to 7.2 which is better than usual   Current blood sugar patterns and problems identified:   She is now complaining about burning with the boluses for her pump when she does the third click.  This occurs even with using a different   Batch of the pumps and she has not been able to find out from the company why this is happening.  She wants to stop the pump  Her blood sugars have been quite erratic especially recently and mostly variability is occurring overnight and mornings and not clear why.  She is occasionally forgetting to do the boluses at meals but not often.  Sometimes may have snacks late at night causing higher readings  Does not have any consistently high readings at any time and blood sugars are overall the highest late morning; she does get up early in the mornings 3 days a week for work which is Wednesdays, Thursdays and Sundays    Oral  hypoglycemic drugs the patient is taking are: None Side effects from medications have been: None  Compliance with the medical regimen: Fair Hypoglycemia: None  Sporadically, may occur in the morning or suppertime    Glucose monitoring:  done about 3 times a day         Glucometer:  Accu-Chek [employer] Blood Glucose readings by meter download.  Mean values apply above for all meters except median for One Touch  PRE-MEAL Fasting Lunch  9 PM Bedtime Overall  Glucose range:  66-292  105- 259  99- 178  96- 157   Mean/median:  144  171  133  133  150+/-54    OVERNIGHT = 53- 201 with average 143  Self-care: The diet that the patient has been following is: tries to limit fats and carbs.  Eating out periodically She uses diet green tea and no sugar in drinks   Dinner 6-7 PM Typical meal intake: Breakfast is eggs              Dietician visit, most recent: 5/16               Exercise: Water exercises 3/7 days in 9/16  Weight history:  Wt Readings from Last 3 Encounters:  06/23/15 201 lb (91.173 kg)  03/24/15 199 lb (90.266 kg)  03/17/15 200 lb (90.719  kg)    Glycemic control:      Lab Results  Component Value Date   HGBA1C 7.2 06/23/2015   HGBA1C 7.7 01/05/2015   HGBA1C 9.9* 06/15/2013   Lab Results  Component Value Date   MICROALBUR 32.7* 01/05/2015   LDLCALC 49 01/05/2015   CREATININE 1.52* 06/23/2015         Medication List       This list is accurate as of: 06/23/15 11:59 PM.  Always use your most recent med list.               albuterol (2.5 MG/3ML) 0.083% nebulizer solution  Commonly known as:  PROVENTIL  Take 3 mLs (2.5 mg total) by nebulization every 6 (six) hours as needed for wheezing or shortness of breath.     VENTOLIN HFA 108 (90 Base) MCG/ACT inhaler  Generic drug:  albuterol  INHALE TWO PUFFS INTO LUNGS EVERY 4 HOURS AS NEEDED     ALPRAZolam 0.25 MG tablet  Commonly known as:  XANAX  Take 1 tablet (0.25 mg total) by mouth at bedtime as needed  for sleep.     aspirin 81 MG EC tablet  Take 1 tablet (81 mg total) by mouth daily.     clopidogrel 75 MG tablet  Commonly known as:  PLAVIX  Take 75 mg by mouth daily.     Dulaglutide 0.75 MG/0.5ML Sopn  Commonly known as:  TRULICITY  Inject once a week     furosemide 40 MG tablet  Commonly known as:  LASIX  Take 0.5 tablets (20 mg total) by mouth 2 (two) times daily.     insulin lispro 100 UNIT/ML injection  Commonly known as:  HUMALOG  INJECT 76 UNITS INTO THE SKIN PER DAY WITH V-GO PUMP     LANTUS SOLOSTAR 100 UNIT/ML Solostar Pen  Generic drug:  Insulin Glargine  Inject 56 Units as directed at bedtime.     levocetirizine 5 MG tablet  Commonly known as:  XYZAL  Take 1 tablet by mouth daily.     metoprolol succinate 100 MG 24 hr tablet  Commonly known as:  TOPROL-XL  TAKE ONE TABLET BY MOUTH TWICE DAILY TAKE  WITH  OR  IMMEDIATELY  FOLLOWING  A  MEAL     rosuvastatin 40 MG tablet  Commonly known as:  CRESTOR  TAKE ONE TABLET BY MOUTH ONCE DAILY     SYMBICORT 160-4.5 MCG/ACT inhaler  Generic drug:  budesonide-formoterol  INHALE TWO PUFFS INTO LUNGS TWICE DAILY     traMADol 50 MG tablet  Commonly known as:  ULTRAM  Take 50 mg by mouth every 8 (eight) hours as needed for moderate pain.     V-GO 40 Kit  USE ONE PER DAY     ZETIA 10 MG tablet  Generic drug:  ezetimibe  TAKE ONE TABLET BY MOUTH ONCE DAILY        Allergies:  Allergies  Allergen Reactions  . Imdur [Isosorbide Dinitrate] Other (See Comments)    Headache/ severe hypotension  . Amoxicillin Rash  . Tetracycline Rash    Past Medical History  Diagnosis Date  . Hypertension   . Hyperlipidemia   . CHF (congestive heart failure) (HCC)     EF is low normal at 50 to 55% per echo in Jan. 2011  . Renal insufficiency, mild   . Nonischemic cardiomyopathy ([place])   . Anemia   . Iron deficiency anemia   . S/P AVR     prosthesis  valve placement 2009 at same time re-do CABG  . Hearing loss of right  ear   . PAF (paroxysmal atrial fibrillation) ([place])   . Bilateral carotid artery stenosis     Bilateral ICA 40-59%/  >50% LECA  . Dyspnea   . CAD (coronary artery disease) cardiologist-  dr Aundra Dubin    Remote CABG in 1998 with redo in 2009  . Type 2 diabetes mellitus ([place])     monitored by dr Dwyane Dee  . Renal calculus, right   . History of non-ST elevation myocardial infarction (NSTEMI)     Sept 2014--  thought to be type II HTN w/ LHC without infarct related artery and patent grafts  . PONV (postoperative nausea and vomiting)     pt states patch worked well before  . Stroke [facility])     residual rt hearing loss  . Moderate persistent asthma     pulmologist-  Dr. Malvin Johns  . GERD (gastroesophageal reflux disease)   . Nausea & vomiting     related to stone  . Abnormally small mouth   . Bruises easily   . Psoriasis     right leg    Past Surgical History  Procedure Laterality Date  . [facility]ortic valve [facility]  [facility]    [facility] same [facility] is [facility]  . Left heart catheterization with coronary/graft angiogram N/A 02/23/2013    Procedure: LEFT HEART CATHETERIZATION WITH CBeatrix Fetters  Surgeon: MBlane Ohara MD;  [facility];  Service: Cardiovascular;  Laterality: N/A;  . Coronary artery bypass graft  1998 &  re-do 2009    Had LIMA to DX/LAD, [facility]; SVG to 3rd OM and PD at time of redo  . Cardiac catheterization  03/23/2008    Pre-redo CABG: L main OK, LAD (T), CFX (T), OM1 99%, RCA (T), LIMA-LAD OK, SVG-OM(?3) OK w/ little florw to OM2, SVG-RCA OK. EF NL  . Tubal ligation    . Tonsillectomy    . Carpal tunnel release    . Transthoracic echocardiogram  02-24-2013       mild LVH,  ef 50-55%/  AV bioprosthesis was present with very mild stenosis and no regurg., mean grandient 14mg, peak grandient 2012m /  mild MR/  mild LAE and RAE/  moderate TR  . Cardiovascular stress test  06-15-2013       dr hilDebara Pickett [place] Adamrfusion study/  no evidence ischemia/  normal wall motion, ef 72%  . Eye surgery Bilateral     cataracts  . Cystoscopy w/ ureteral stent placement Right 12/20/2014    Procedure: CYSTOSCOPY WITH RETROGRADE PYELOGRAM/URETERAL STENT PLACEMENT;  Surgeon: PatCleon GustinD;  [facility];  Laterality: Right;  . Holmium laser application Right 12/23/95/0263 Procedure:  HOLMIUM LASER LITHOTRIPSY;  Surgeon: PatCleon GustinD;  [facility];  Laterality: Right;    Family History  Problem Relation Age of Onset  . Heart disease Mother   . Heart disease Father   . Heart failure Father   . Diabetes Son   . Diabetes Maternal Grandmother     Social History:  reports that she has never smoked. She has never used smokeless tobacco. She reports that she does not drink alcohol or use illicit drugs.    Review of Systems    Lipid history: On 4010  mg Crestor along with Zetia since she has had her coronary artery disease diagnosis    Lab Results  Component Value Date   CHOL 108 01/05/2015   HDL 27.30* 01/05/2015   LDLCALC 49 01/05/2015   LDLDIRECT 109.2 03/01/2014   TRIG 161.0* 01/05/2015   CHOLHDL 4 01/05/2015           Eyes:   Most recent eye exam was 2016 and was not told to have any retinopathy  Hypertension: Blood pressure is well controlled now Previously and she is taking metoprolol and Lasix Norvasc was stopped previouslyecause of low normal blood pressure  RENAL insufficiency: Appears unchanged  Diabetic foot exam in 5/16 shows She has complete loss of monofilament sensation in her distal feet with only mild sensitivity present on the right first and second toes  Physical Examination:  BP 122/64 mmHg  Pulse 88  Temp(Src) 98.4 F (36.9 C) (Oral)  Wt 201 lb (91.173 kg)  SpO2 98%   ASSESSMENT:  Diabetes type 2, uncontrolled   See history of present  illness for detailed discussion of his current management, blood sugar patterns and problems identified  She has much better control of her diabetes with starting the V-go pump Her A1c is down to 7.2, before she came here was 9.9  She is also using less insulin now with 40 units of basal compared to 56 units of Lantus However she is having some more fluctuation in her blood sugars are recently especially morning partly related to her snacks or variable meal intake Compliance with her boluses also is not consistent and she may not take the full amount of boluses at some meals.  She is now complaining about burning with the boluses after the third click Although she wants to discontinue the pump discussed that she is getting much better control with this and would like to at least keep it for her basal delivery  She also is benefiting from continuing Trulicity 0.34 mg which helps her watch her diet although has not lost any weight recently   PLAN:   Continue pump and tried to use Humalog injection with the pen for at least her suppertime bolus, she could do the pen injection for other meals also   May need an extra click pump for eating any large snack or higher blood sugars before eating  Since she does not want to switch brands of insulin she can continue Humalog and given 90 day prescription  Discussed evaluation by nephrologist for renal insufficiency and she would discuss with PCP  Check microalbumin on the next visit    continue Trulicity 7.42  avoid large snacks and reduce overall caloric intake    Patient Instructions  Use Humalog pen for dinner dose, usually 2 units more  Check blood sugars on waking up  3 times a week Also check blood sugars about 2 hours after a meal and do this after different meals by rotation  Recommended blood sugar levels on waking up is 90-130 and about 2 hours after meal is 130-160  Please bring your blood sugar monitor to each visit, thank  you   Counseling time on subjects discussed above is over 50% of today's 25 minute visit   Hannah [name] 06/24/2015, 10:23 AM   Note: This office note was prepared with Dragon voice recognition system technology. Any transcriptional errors that result from this process are unintentional.

## 2015-06-24 MED ORDER — DULAGLUTIDE 0.75 MG/0.5ML ~~LOC~~ SOAJ: SUBCUTANEOUS | Status: DC

## 2015-06-27 ENCOUNTER — Other Ambulatory Visit: Payer: Self-pay | Admitting: *Deleted

## 2015-06-27 MED ORDER — INSULIN LISPRO 100 UNIT/ML ~~LOC~~ SOLN: SUBCUTANEOUS | Status: DC

## 2015-07-05 ENCOUNTER — Encounter: Payer: Self-pay | Admitting: Nurse Practitioner

## 2015-07-05 ENCOUNTER — Ambulatory Visit (INDEPENDENT_AMBULATORY_CARE_PROVIDER_SITE_OTHER): Payer: Medicare Other | Admitting: Nurse Practitioner

## 2015-07-05 VITALS — BP 118/60 | HR 80 | Ht 64.0 in | Wt 201.6 lb

## 2015-07-05 DIAGNOSIS — E785 Hyperlipidemia, unspecified: Secondary | ICD-10-CM

## 2015-07-05 DIAGNOSIS — I2510 Atherosclerotic heart disease of native coronary artery without angina pectoris: Secondary | ICD-10-CM | POA: Diagnosis not present

## 2015-07-05 DIAGNOSIS — I5032 Chronic diastolic (congestive) heart failure: Secondary | ICD-10-CM | POA: Diagnosis not present

## 2015-07-05 DIAGNOSIS — I10 Essential (primary) hypertension: Secondary | ICD-10-CM

## 2015-07-05 DIAGNOSIS — I471 Supraventricular tachycardia: Secondary | ICD-10-CM

## 2015-07-05 LAB — HEPATIC FUNCTION PANEL
ALT: 44 U/L — ABNORMAL HIGH (ref 6–29)
AST: 40 U/L — ABNORMAL HIGH (ref 10–35)
Albumin: 3.5 g/dL — ABNORMAL LOW (ref 3.6–5.1)
Alkaline Phosphatase: 50 U/L (ref 33–130)
Bilirubin, Direct: 0.2 mg/dL (ref <=0.2)
Indirect Bilirubin: 0.7 mg/dL (ref 0.2–1.2)
Total Bilirubin: 0.9 mg/dL (ref 0.2–1.2)
Total Protein: 6.2 g/dL (ref 6.1–8.1)

## 2015-07-05 LAB — CBC
HCT: 35.4 % — ABNORMAL LOW (ref 36.0–46.0)
Hemoglobin: 11.5 g/dL — ABNORMAL LOW (ref 12.0–15.0)
MCH: 28.9 pg (ref 26.0–34.0)
MCHC: 32.5 g/dL (ref 30.0–36.0)
MCV: 88.9 fL (ref 78.0–100.0)
MPV: 9.3 fL (ref 8.6–12.4)
Platelets: 165 10*3/uL (ref 150–400)
RBC: 3.98 MIL/uL (ref 3.87–5.11)
RDW: 12.9 % (ref 11.5–15.5)
WBC: 6.2 10*3/uL (ref 4.0–10.5)

## 2015-07-05 LAB — LIPID PANEL
Cholesterol: 116 mg/dL — ABNORMAL LOW (ref 125–200)
HDL: 38 mg/dL — ABNORMAL LOW (ref >=46)
LDL Cholesterol: 58 mg/dL (ref <130)
Total CHOL/HDL Ratio: 3.1 Ratio (ref <=5.0)
Triglycerides: 99 mg/dL (ref <150)
VLDL: 20 mg/dL (ref <30)

## 2015-07-05 NOTE — Progress Notes (Signed)
CARDIOLOGY OFFICE NOTE  Date:  07/05/2015    Hannah [name] Date of Birth: 1946/05/27 Medical Record #[id number]  PCP:  Nicola Girt, DO  Cardiologist:  Aundra Dubin    Chief Complaint  Patient presents with  . Coronary Artery Disease  . Atrial Fibrillation  . Hypertension    3 month check - seen for Dr. Aundra Dubin & Lovena Le    History of Present Illness: Hannah [name] is a 70 y.o. female who presents today for a follow up visit. Seen for Dr. Aundra Dubin.   She has a history of CAD s/p CABG, bioprosthetic AVR, CKD, & paroxysmal atrial fibrillation. She had her initial CABG in 1998, then had redo CABG in 2009 with bioprosthetic AVR. She does not remember when she last had documented atrial fibrillation, but she thinks it was a number of years ago, sounds peri-operative around the time of her redo CABG. She was never put on anticoagulation but has been on dronedarone in the past.   In 9/14, she developed tachypalpitations and later chest pain. In the ER, SBP was in the 200s with elevated HR. Troponin was mildly elevated. Therefore, she had a cardiac cath showing patent grafts. She was thought to have had demand ischemia in the setting of hypertensive emergency + tachycardia. After discharge, she continued to have runs of tachypalpitations. A 3 week event monitor was put on her. This showed runs, sometimes long, of atrial tachycardia. She was started on Toprol XL when these runs were noted. No further palpitations (complete resolution of symptoms).   In 1/15, she was admitted to [facility] with atypical chest pain. She was ruled out for MI and Lexiscan Cardiolite showed no ischemia. She was started on Imdur and sent home. After discharge, she developed a daily headache and nausea. She did not eat or drink much due to the nausea. Shortly after this, she was standing in her grandson's pediatrician's office and became severely lightheaded. She had to sit down. She came to the ER  and was found to be orthostatic. She was given IV fluid and got better quickly. We had her stop Imdur and symptoms completely resolved.   Event monitor in 8/16 showed atrial tachycardia versus atypical atrial flutter. Toprol XL was increased to 100 mg bid. Some decrease in palpitations but they were still bothersome. She has seen Dr. Lovena Le for consideration of other options - he doubted that she was having atrial flutter - he recommended watchful waiting and to keep her on her current regimen.   Last seen in September by Dr. Aundra Dubin. Saw Dr. Lovena Le in October.   Comes in today. Here alone. Doing ok. Still has some occasional chest tightness/flutter - had it last night - her remedy is take a Xanax and then lay down and it is resolved. Has had 2 episodes this month. Metoprolol has not totally resolved but has certainly improved. Usually only has 2 to 3 spells per month. Seeing Endocrine - now on disposable pump. A1C down to 7. Doing water aerobics at the gym. No exertional symptoms. Breathing is good. 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 She is pleased with how she is doing.   PMH: 1. Diabetes mellitus 2. HTN 3. Obesity 4. Atrial arrhythmias: Paroxysmal atrial fibrillation seems to have been post-operative after CAD-AVR. She has a history of amiodarone toxicity. She has not been on coumadin. 3-week event monitor (9/13) with no atrial fibrillation. 3-week event monitor (10/14) with runs of probable atrial  tachycardia. 30 day monitor in 8/16 with atrial tachycardia versus atypical atrial flutter (multiple episodes).  5. CAD: CABG 1998 with LIMA-LAD/diagonal, sequential SVG-OM1/OM2, SVG-RCA. Redo CABG 2009 with SVG-OM3, SVG-PDA. Lexiscan myoview (9/13): EF 61%, no ischemia or infarction. LHC (10/14) with all grafts patent except the old SVG-RCA. There was 90% stenosis in the AV LCx before ungrafted MOM, but this was unchanged from prior study. Lexiscan Cardiolite (1/15) with no  ischemia. She does not tolerate Imdur.  6. CKD 7. Bioprosthetic aortic valve: Echo (2011) with EF 50-55%, bioprosthetic aortic valve well-seated. Echo (2/13) with EF 60-65%, bioprosthetic aortic valve looked ok. Echo (9/14): EF 50-55%, mild LVH, bioprosthetic AVR with mean gradient 10, mild MR, moderate TR.  8. Asthma: since her teens. 9. Low back pain.  10. 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 Per neurologist, did not appear cardioembolic.  11. Carotid stenosis: 40-59% bilateral ICA stenosis on 10/14 carotid dopplers. Carotid doppler (11/15) with 40-59% bilateral ICA stenosis.  12. Sleep study 12/14 without significant OSA.  13. Chronic diastolic CHF  Past Medical History  Diagnosis Date  . Hypertension   . Hyperlipidemia   . CHF (congestive heart failure) (HCC)     EF is low normal at 50 to 55% per echo in Jan. 2011  . Renal insufficiency, mild   . Nonischemic cardiomyopathy ([place])   . Anemia   . Iron deficiency anemia   . S/P AVR     prosthesis valve placement 2009 at same time re-do CABG  . Hearing loss of right ear   . PAF (paroxysmal atrial fibrillation) ([place])   . Bilateral carotid artery stenosis     Bilateral ICA 40-59%/  >50% LECA  . Dyspnea   . CAD (coronary artery disease) cardiologist-  dr Aundra Dubin    Remote CABG in 1998 with redo in 2009  . Type 2 diabetes mellitus ([place])     monitored by dr Dwyane Dee  . Renal calculus, right   . History of non-ST elevation myocardial infarction (NSTEMI)     Sept 2014--  thought to be type II HTN w/ LHC without infarct related artery and patent grafts  . PONV (postoperative nausea and vomiting)     pt states patch worked well before  . Stroke [facility])     residual rt hearing loss  . Moderate persistent asthma     pulmologist-  Dr. Malvin Johns  . GERD (gastroesophageal reflux disease)   . Nausea & vomiting     related to stone  . Abnormally small mouth   . Bruises easily   . Psoriasis     right leg    Past Surgical  History  Procedure Laterality Date  . [facility]ortic valve [facility]  [facility]    [facility] same [facility] is [facility]  . Left heart catheterization with coronary/graft angiogram N/A 02/23/2013    Procedure: LEFT HEART CATHETERIZATION WITH CBeatrix Fetters  Surgeon: MBlane Ohara MD;  [facility];  Service: Cardiovascular;  Laterality: N/A;  . Coronary artery bypass graft  1998 &  re-do 2009    Had LIMA to DX/LAD, SVG to 2 marginal branches [facility]; SVG to 3rd OM and PD at time of redo  . Cardiac catheterization  03/23/2008    Pre-redo CABG: L main OK, LAD (T), CFX (T), OM1 99%, RCA (T), LIMA-LAD OK, SVG-OM(?3) OK w/ little florw to OM2, SVG-RCA OK. EF NL  . Tubal ligation    .  Tonsillectomy    . Carpal tunnel release    . Transthoracic echocardiogram  02-24-2013       mild LVH,  ef 50-55%/  AV bioprosthesis was present with very mild stenosis and no regurg., mean grandient 87mHg, peak grandient 246mg /  mild MR/  mild LAE and RAE/  moderate TR  . Cardiovascular stress test  06-15-2013      dr hiDebara Pickett  LeCarlton Adamerfusion study/  no evidence ischemia/  normal wall motion, ef 72%  . Eye surgery Bilateral     cataracts  . Cystoscopy w/ ureteral stent placement Right 12/20/2014    Procedure: CYSTOSCOPY WITH RETROGRADE PYELOGRAM/URETERAL STENT PLACEMENT;  Surgeon: PaCleon GustinMD;  [facility];  Laterality: Right;  . Holmium laser application Right 12/13/15/4081  Procedure:  HOLMIUM LASER LITHOTRIPSY;  Surgeon: PaCleon GustinMD;  [facility];  Laterality: Right;     Medications: Current Outpatient Prescriptions  Medication Sig Dispense Refill  . albuterol (PROVENTIL) (2.5 MG/3ML) 0.083% nebulizer solution Take 3 mLs (2.5 mg total) by nebulization every 6 (six) hours as needed for wheezing or shortness of breath. 180 mL 0  . ALPRAZolam  (XANAX) 0.25 MG tablet Take 1 tablet (0.25 mg total) by mouth at bedtime as needed for sleep. 30 tablet 0  . aspirin EC 81 MG EC tablet Take 1 tablet (81 mg total) by mouth daily.    . clopidogrel (PLAVIX) 75 MG tablet Take 75 mg by mouth daily.    . Dulaglutide (TRULICITY) 0.4.48GJE/5.6DJOPN Inject once a week 4 pen 3  . furosemide (LASIX) 40 MG tablet Take 0.5 tablets (20 mg total) by mouth 2 (two) times daily. 30 tablet 5  . Insulin Disposable Pump (V-GO 40) KIT USE ONE PER DAY 30 kit 3  . insulin lispro (HUMALOG) 100 UNIT/ML injection INJECT 76 UNITS INTO THE SKIN PER DAY WITH V-GO PUMP 90 mL 1  . levocetirizine (XYZAL) 5 MG tablet Take 1 tablet by mouth daily.    . metoprolol succinate (TOPROL-XL) 100 MG 24 hr tablet TAKE ONE TABLET BY MOUTH TWICE DAILY TAKE  WITH  OR  IMMEDIATELY  FOLLOWING  A  MEAL 180 tablet 2  . rosuvastatin (CRESTOR) 40 MG tablet TAKE ONE TABLET BY MOUTH ONCE DAILY 30 tablet 5  . SYMBICORT 160-4.5 MCG/ACT inhaler INHALE TWO PUFFS INTO LUNGS TWICE DAILY 1 Inhaler 0  . traMADol (ULTRAM) 50 MG tablet Take 50 mg by mouth every 8 (eight) hours as needed for moderate pain.     . VENTOLIN HFA 108 (90 BASE) MCG/ACT inhaler INHALE TWO PUFFS INTO LUNGS EVERY 4 HOURS AS NEEDED 18 each 11  . ZETIA 10 MG tablet TAKE ONE TABLET BY MOUTH ONCE DAILY 30 tablet 9   No current facility-administered medications for this visit.    Allergies: Allergies  Allergen Reactions  . Imdur [Isosorbide Dinitrate] Other (See Comments)    Headache/ severe hypotension  . Amoxicillin Rash  . Tetracycline Rash    Social History: The patient  reports that she has never smoked. She has never used smokeless tobacco. She reports that she does not drink alcohol or use illicit drugs.   Family History: The patient's family history includes Diabetes in her maternal grandmother and son; Heart disease in her father and mother; Heart failure in her father.   Review of Systems: Please see the history of  present illness.  Otherwise, the review of systems is positive for none.   All other systems are reviewed and negative.   Physical Exam: VS:  BP 118/60 mmHg  Pulse 80  Ht _0  (1.626 m)  Wt 201 lb 9.6 oz (91.445 kg)  BMI 34.59 kg/m2 .  BMI Body mass index is 34.59 kg/(m^2).  Wt Readings from Last 3 Encounters:  07/05/15 201 lb 9.6 oz (91.445 kg)  06/23/15 201 lb (91.173 kg)  03/24/15 199 lb (90.266 kg)    General: Pleasant. She remains obese. She is alert and in no acute distress.  HEENT: Normal. Neck: Supple, no JVD, carotid bruits, or masses noted.  Cardiac: Regular rate and rhythm. Valve is crisp. No murmurs, rubs, or gallops. No edema.  Respiratory:  Lungs are clear to auscultation bilaterally with normal work of breathing.  GI: Soft and nontender.  MS: No deformity or atrophy. Gait and ROM intact. Skin: Warm and dry. Color is normal.  Neuro:  Strength and sensation are intact and no gross focal deficits noted.  Psych: Alert, appropriate and with normal affect.   LABORATORY DATA:  EKG:  EKG is not ordered today.  Lab Results  Component Value Date   WBC 9.6 12/06/2014   HGB 11.2* 12/20/2014   HCT 33.0* 12/20/2014   PLT 185 12/06/2014   GLUCOSE 159* 06/23/2015   CHOL 108 01/05/2015   TRIG 161.0* 01/05/2015   HDL 27.30* 01/05/2015   LDLDIRECT 109.2 03/01/2014   LDLCALC 49 01/05/2015   ALT 22 01/05/2015   AST 22 01/05/2015   NA 139 06/23/2015   K 4.5 06/23/2015   CL 102 06/23/2015   CREATININE 1.52* 06/23/2015   BUN 34* 06/23/2015   CO2 32 06/23/2015   TSH 2.11 07/09/2014   INR 1.06 06/15/2013   HGBA1C 7.2 06/23/2015   MICROALBUR 32.7* 01/05/2015    BNP (last 3 results) No results for input(s): BNP in the last 8760 hours.  ProBNP (last 3 results) No results for input(s): PROBNP in the last 8760 hours.   Other Studies Reviewed Today:  Echo Study Conclusions from 02/2013  - Left ventricle: The cavity size was normal. Wall thickness was increased  in a pattern of mild LVH. Systolic function was normal. The estimated ejection fraction was in the range of 50% to 55%. Wall motion was normal; there were no regional wall motion abnormalities. Left ventricular diastolic function parameters were normal. - Aortic valve: A bioprosthesis was present. There was very mild stenosis. - Mitral valve: Mild regurgitation. - Left atrium: The atrium was mildly dilated. - Right atrium: The atrium was mildly dilated. - Tricuspid valve: Moderate regurgitation. - Pulmonary arteries: PA peak pressure: 50m Hg (S).  Assessment/Plan: 1. CAD -  LHC showed stable anatomy with patent grafts except for SVG-RCA from older CABG.  Lexiscan Cardiolite that showed no ischemia. No recent chest pain.  - Continue ASA 81, Plavix, statin, Toprol XL.   2. Atrial Arrhythmias/palpitations - seems to be at her baseline. Will keep her on her current regimen.   3. HYPERLIPIDEMIA - Crestor and Zetia. Labs today  4. Bioprosthetic aortic valve - Well-seated on last echo in 2014.   5. Chronic diastolic CHF - looks well compensated. No change in current regimen.   6. CKD  7. Anemia - chronic - previously on Aranesp - recheck lab today.   8. Carotid disease - has 40 to 59% bilateral stenosis on study from 04/2015 - needs repeat study in 05/2016  Current medicines are reviewed with  the patient today.  The patient does not have concerns regarding medicines other than what has been noted above.  The following changes have been made:  See above.  Labs/ tests ordered today include:    Orders Placed This Encounter  Procedures  . CBC  . Hepatic function panel  . Lipid panel     Disposition:   FU with Dr. Aundra Dubin in 3 to 4 months. I will be happy to follow as well.   Patient is agreeable to this plan and will call if any problems develop in the interim.   Signed: [name], RN, ANP-C 07/05/2015 9:00 AM  [facility] [address] [place], [place]  [address] Phone: [phone] Fax: [phone]

## 2015-07-05 NOTE — Patient Instructions (Addendum)
We will be checking the following labs today - HPF, lipids and CBC   Medication Instructions:    Continue with your current medicines.     Testing/Procedures To Be Arranged:  N/A  Follow-Up:   See me or Dr. Aundra Dubin in 4 months    Other Special Instructions:   Keep up the good work!!    If you need a refill on your cardiac medications before your next appointment, please call your pharmacy.   Call the [place] office at [phone] if you have any questions, problems or concerns.

## 2015-07-08 ENCOUNTER — Other Ambulatory Visit: Payer: Self-pay | Admitting: *Deleted

## 2015-07-08 MED ORDER — EZETIMIBE 10 MG PO TABS: 10.0000 mg | ORAL_TABLET | Freq: Every day | ORAL | Status: DC

## 2015-07-08 MED ORDER — DULAGLUTIDE 0.75 MG/0.5ML ~~LOC~~ SOAJ: SUBCUTANEOUS | Status: DC

## 2015-07-08 MED ORDER — INSULIN LISPRO 100 UNIT/ML ~~LOC~~ SOLN: SUBCUTANEOUS | Status: DC

## 2015-07-11 ENCOUNTER — Other Ambulatory Visit: Payer: Self-pay | Admitting: *Deleted

## 2015-07-11 MED ORDER — V-GO 40 KIT: PACK | Status: DC

## 2015-07-12 ENCOUNTER — Telehealth: Payer: Self-pay | Admitting: Endocrinology

## 2015-07-12 NOTE — Telephone Encounter (Signed)
Pt states form coming from [place] for vgo supplies to [place] please get this done as quickly as you can she will be out in 2 days. Fax # [phone] pt states it should have come over yesterday.

## 2015-07-12 NOTE — Telephone Encounter (Signed)
rx was sent yesterday 07/11/2015

## 2015-07-13 ENCOUNTER — Other Ambulatory Visit: Payer: Self-pay | Admitting: *Deleted

## 2015-07-13 MED ORDER — V-GO 40 KIT: PACK | Status: DC

## 2015-07-18 ENCOUNTER — Other Ambulatory Visit: Payer: Self-pay | Admitting: Emergency Medicine

## 2015-07-19 ENCOUNTER — Other Ambulatory Visit: Payer: Self-pay | Admitting: *Deleted

## 2015-07-19 MED ORDER — BUDESONIDE-FORMOTEROL FUMARATE 160-4.5 MCG/ACT IN AERO: INHALATION_SPRAY | RESPIRATORY_TRACT | Status: DC

## 2015-07-21 ENCOUNTER — Encounter: Payer: Self-pay | Admitting: Emergency Medicine

## 2015-07-21 ENCOUNTER — Ambulatory Visit (INDEPENDENT_AMBULATORY_CARE_PROVIDER_SITE_OTHER): Payer: Medicare Other | Admitting: Emergency Medicine

## 2015-07-21 ENCOUNTER — Other Ambulatory Visit: Payer: Self-pay | Admitting: *Deleted

## 2015-07-21 VITALS — BP 122/74 | HR 74 | Ht 64.0 in | Wt 203.0 lb

## 2015-07-21 DIAGNOSIS — J4541 Moderate persistent asthma with (acute) exacerbation: Secondary | ICD-10-CM

## 2015-07-21 DIAGNOSIS — R945 Abnormal results of liver function studies: Principal | ICD-10-CM

## 2015-07-21 DIAGNOSIS — J309 Allergic rhinitis, unspecified: Secondary | ICD-10-CM

## 2015-07-21 DIAGNOSIS — R7989 Other specified abnormal findings of blood chemistry: Secondary | ICD-10-CM

## 2015-07-21 MED ORDER — TRIAMCINOLONE ACETONIDE 55 MCG/ACT NA AERO: 2.0000 | INHALATION_SPRAY | Freq: Every day | NASAL | Status: DC

## 2015-07-21 MED ORDER — BUDESONIDE-FORMOTEROL FUMARATE 160-4.5 MCG/ACT IN AERO: INHALATION_SPRAY | RESPIRATORY_TRACT | Status: DC

## 2015-07-21 NOTE — Patient Instructions (Addendum)
Please continue Symbicort 2 puffs twice a day. We will refill this medicine for you Keep albuterol available to use as needed for shortness of breath Continue Xyzal once a day Restart Nasacort 2 sprays each nostril once a day Consider restarting your nasal saline washes once a day. You will need to do this in the spring during allergy season Start chlorpheniramine 4 mg up to every 6 hours as needed for congestion and drainage Try using Tylenol Cold and flu as directed for symptom management Follow with Dr Lamonte Sakai in 6 months or sooner if you have any problems

## 2015-07-21 NOTE — Assessment & Plan Note (Signed)
Continue Xyzal once a day Restart Nasacort 2 sprays each nostril once a day Consider restarting your nasal saline washes once a day. You will need to do this in the spring during allergy season Start chlorpheniramine 4 mg up to every 6 hours as needed for congestion and drainage Try using Tylenol Cold and flu as directed for symptom management

## 2015-07-21 NOTE — Assessment & Plan Note (Signed)
Please continue Symbicort 2 puffs twice a day. We will refill this medicine for you Keep albuterol available to use as needed for shortness of breath Follow with Dr Lamonte Sakai in 6 months or sooner if you have any problems

## 2015-07-21 NOTE — Progress Notes (Signed)
Subjective:    Patient ID: Hannah [name], female    DOB: February 13, 1946, 70 y.o.   MRN: [id number]   HPI 70 yo never smoker, hx of CAD/CABG, aortic valve replacement, A Fib. Also with hx of childhood asthma. She was diagnosed with adult asthma in her 42's in setting wheeze, SOB, cough that wouldn't go away. Was on prn SABA for a while, started on Symbicort early 2009. Had PFT's done in August 2009 and 2010 that showed mixed disease. In 10/10 FEV1 1.01L. Averages Prednisone about 3 - 4x a year. Never hospitalized or on MV due to breathing. Amiodarone stopped late 2010.   ROV 10/14/09 -- returns for f/u of her asthma, restrictive disease. Tells me that she has had PND, allergy symptoms this spring, making her mor SOB and giving her more cough.   ROV 03/07/10 -- Hx CAD/CABG, asthma and allergies. Still with some nasal congestion, doing loratadine and fluticasone spray, rarely [place]. Does some throat clearing. Taking her symbicort reliably. Uses ventolin a couple times a month.   ROV 10/04/10 -- Hx CAD/CABG, asthma and allergies. Has been having more trouble with nasal gtt and allergies and therefore more cough chest tightness. Using Symbicort. Has needed ventolin about 4x a day for last week. More SOB. Can't tolerate the NSWs.   ROV 04/24/11 -- Hx CAD/CABG, asthma and allergies. Here for follow up. She was treated over phone for bronchitis after URI - better after doxy and prednisone. Still on Symbicort bid. She is much improved, back to baseline. She has had the flu shot. She is rarely using SABA - was requiring frequently when she was exacerbated. She is having several AE a year.   ROV 02/22/12 -- Hx CAD/CABG, asthma and allergies. She is undergoing eval for HA, nausea, imbalance. May have had a small CVA - had MRI and neuro eval. Sometimes these episodes are associated with SOB. She remains on Symbicort.  She is on loratadine, uses nasal steroid prn.   ROV 09/09/12 -- Hx CAD/CABG, asthma and allergies. She is  starting to have nasal gtt and cough. She can't do [place]. Interested in changing loratadine to alternative. She doesn't like the nasal steroids because they run down her throat. Still on Symbicort.   ROV 03/17/13 -- Hx CAD/CABG, asthma and allergies.  Returns for f/u. She keeps a baseline cough. No asthma flares since last time. Remains on Symbicort, needs a spacer. Went back to loratadine. Not using nasal washes or steroids. She has had Flu Shot.   ROV 01/12/14 -- follows for asthma, allergies, cough. She tells me that she has been doing well, is on symbicort, rarely needs albuterol. Takes loratadine, not currently on nasal steroid. She had Prevnar-13 in Sept '14. No exacerbations!  ROV 07/21/15 -- patient has a history of rhinitis, chronic cough, mixed obstruction and restriction on pulmonary function testing from 2010. She was seen in our office for an acute exacerbation approximately a year ago and then again in May 2016. I have not seen her since August 2015. She is reliable with her Symbicort.  She is on xyzal. She had a sick contact from grandchild. She is having some increase in nasal gtt, scratchy throat.  She has nasacort but isn't currently using.   Review of Systems as per HPI    Objective:   Physical Exam Filed Vitals:   07/21/15 1628  BP: 122/74  Pulse: 74  Height: 5\' 4"  (1.626 m)  Weight: 203 lb (92.08 kg)  SpO2: 97%  Gen: Pleasant, obese, in no distress,  normal affect  ENT: No lesions,  mouth clear,  oropharynx clear, no postnasal drip  Neck: No JVD, no TMG, no carotid bruits  Lungs: No use of accessory muscles, no dullness to percussion, clear without rales or rhonchi  Cardiovascular: RRR, heart sounds normal, no murmur or gallops, no peripheral edema  Musculoskeletal: No deformities, no cyanosis or clubbing  Neuro: alert, non focal  Skin: Warm, no lesions or rashes   Assessment & Plan:  Allergic rhinitis Continue Xyzal once a day Restart Nasacort 2 sprays each  nostril once a day Consider restarting your nasal saline washes once a day. You will need to do this in the spring during allergy season Start chlorpheniramine 4 mg up to every 6 hours as needed for congestion and drainage Try using Tylenol Cold and flu as directed for symptom management   Asthma Please continue Symbicort 2 puffs twice a day. We will refill this medicine for you Keep albuterol available to use as needed for shortness of breath Follow with Dr Lamonte Sakai in 6 months or sooner if you have any problems

## 2015-07-27 ENCOUNTER — Other Ambulatory Visit (INDEPENDENT_AMBULATORY_CARE_PROVIDER_SITE_OTHER): Payer: Medicare Other | Admitting: *Deleted

## 2015-07-27 DIAGNOSIS — R7989 Other specified abnormal findings of blood chemistry: Secondary | ICD-10-CM | POA: Diagnosis not present

## 2015-07-27 DIAGNOSIS — R945 Abnormal results of liver function studies: Principal | ICD-10-CM

## 2015-07-27 LAB — HEPATIC FUNCTION PANEL
ALT: 38 U/L — ABNORMAL HIGH (ref 6–29)
AST: 34 U/L (ref 10–35)
Albumin: 3.6 g/dL (ref 3.6–5.1)
Alkaline Phosphatase: 58 U/L (ref 33–130)
Bilirubin, Direct: 0.2 mg/dL (ref <=0.2)
Indirect Bilirubin: 0.6 mg/dL (ref 0.2–1.2)
Total Bilirubin: 0.8 mg/dL (ref 0.2–1.2)
Total Protein: 6.7 g/dL (ref 6.1–8.1)

## 2015-07-27 NOTE — Addendum Note (Signed)
Addended by: Eulis Foster on: 07/27/2015 01:46 PM   Modules accepted: Orders

## 2015-08-01 ENCOUNTER — Ambulatory Visit (INDEPENDENT_AMBULATORY_CARE_PROVIDER_SITE_OTHER): Payer: Medicare Other | Admitting: Emergency Medicine

## 2015-08-01 ENCOUNTER — Encounter: Payer: Self-pay | Admitting: Emergency Medicine

## 2015-08-01 VITALS — BP 124/66 | HR 81 | Temp 98.3°F | Ht 64.0 in | Wt 202.0 lb

## 2015-08-01 DIAGNOSIS — J309 Allergic rhinitis, unspecified: Secondary | ICD-10-CM

## 2015-08-01 DIAGNOSIS — J4541 Moderate persistent asthma with (acute) exacerbation: Secondary | ICD-10-CM

## 2015-08-01 DIAGNOSIS — J209 Acute bronchitis, unspecified: Secondary | ICD-10-CM | POA: Diagnosis not present

## 2015-08-01 MED ORDER — LEVOFLOXACIN 500 MG PO TABS: 500.0000 mg | ORAL_TABLET | Freq: Every day | ORAL | Status: DC

## 2015-08-01 NOTE — Patient Instructions (Addendum)
Please take levaquin 500mg  daily for 7 days  Please continue nasacort and xyzal as you are taking them  Continue symbicort twice a day  Please use albuterol nebulizer as needed Use tylenol cold and flu as needed.  Use tessalon perles as needed Follow with Dr Lamonte Sakai next available

## 2015-08-01 NOTE — Assessment & Plan Note (Signed)
Please take levaquin 500mg  daily for 7 days  Please continue nasacort and xyzal as you are taking them  Use tylenol cold and flu as needed.  Use tessalon perles as needed Follow with Dr Lamonte Sakai next available

## 2015-08-01 NOTE — Progress Notes (Signed)
Subjective:    Patient ID: Hannah [name], female    DOB: [birth year], 70 y.o.   MRN: [id number]   HPI 70 yo never smoker, hx of CAD/CABG, aortic valve replacement, A Fib. Also with hx of childhood asthma. She was diagnosed with adult asthma in her 1's in setting wheeze, SOB, cough that wouldn't go away. Was on prn SABA for a while, started on Symbicort early 2009. Had PFT's done in August 2009 and 2010 that showed mixed disease. In 10/10 FEV1 1.01L. Averages Prednisone about 3 - 4x a year. Never hospitalized or on MV due to breathing. Amiodarone stopped late 2010.   ROV 10/14/09 -- returns for f/u of her asthma, restrictive disease. Tells me that she has had PND, allergy symptoms this spring, making her mor SOB and giving her more cough.   ROV 03/07/10 -- Hx CAD/CABG, asthma and allergies. Still with some nasal congestion, doing loratadine and fluticasone spray, rarely [place]. Does some throat clearing. Taking her symbicort reliably. Uses ventolin a couple times a month.   ROV 10/04/10 -- Hx CAD/CABG, asthma and allergies. Has been having more trouble with nasal gtt and allergies and therefore more cough chest tightness. Using Symbicort. Has needed ventolin about 4x a day for last week. More SOB. Can't tolerate the NSWs.   ROV 04/24/11 -- Hx CAD/CABG, asthma and allergies. Here for follow up. She was treated over phone for bronchitis after URI - better after doxy and prednisone. Still on Symbicort bid. She is much improved, back to baseline. She has had the flu shot. She is rarely using SABA - was requiring frequently when she was exacerbated. She is having several AE a year.   ROV 02/22/12 -- Hx CAD/CABG, asthma and allergies. She is undergoing eval for HA, nausea, imbalance. May have had a small CVA - had MRI and neuro eval. Sometimes these episodes are associated with SOB. She remains on Symbicort.  She is on loratadine, uses nasal steroid prn.   ROV 09/09/12 -- Hx CAD/CABG, asthma and allergies. She is  starting to have nasal gtt and cough. She can't do [place]. Interested in changing loratadine to alternative. She doesn't like the nasal steroids because they run down her throat. Still on Symbicort.   ROV 03/17/13 -- Hx CAD/CABG, asthma and allergies.  Returns for f/u. She keeps a baseline cough. No asthma flares since last time. Remains on Symbicort, needs a spacer. Went back to loratadine. Not using nasal washes or steroids. She has had Flu Shot.   ROV 01/12/14 -- follows for asthma, allergies, cough. She tells me that she has been doing well, is on symbicort, rarely needs albuterol. Takes loratadine, not currently on nasal steroid. She had Prevnar-13 in Sept '14. No exacerbations!  ROV 07/21/15 -- patient has a history of rhinitis, chronic cough, mixed obstruction and restriction on pulmonary function testing from 2010. She was seen in our office for an acute exacerbation approximately a year ago and then again in May 2016. I have not seen her since August 2015. She is reliable with her Symbicort.  She is on xyzal. She had a sick contact from grandchild. She is having some increase in nasal gtt, scratchy throat.  She has nasacort but isn't currently using.   ROV 08/01/15 -- follow-up visit for patient with a history of chronic rhinitis, chronic cough, mixed restriction and obstruction on pulmonary function testing. At her last visit she was having increased rhinitis and we restarted Nasacort, continued xyzal, started chlorpheniramine. She was initially  doing better until about 3 nights ago, had more cough, more dyspnea. Having L > R rib soreness. She is coughing up brown mucous x 3 days, no fever.   Review of Systems as per HPI    Objective:   Physical Exam Filed Vitals:   08/01/15 1453  BP: 124/66  Pulse: 81  Temp: 98.3 F (36.8 C)  TempSrc: Oral  Height: 5\' 4"  (1.626 m)  Weight: 202 lb (91.627 kg)  SpO2: 99%   Gen: Pleasant, obese, in no distress,  normal affect  ENT: No lesions,  mouth  clear,  oropharynx clear, no postnasal drip  Neck: No JVD, no TMG, no carotid bruits  Lungs: No use of accessory muscles, no dullness to percussion, clear without rales or rhonchi  Cardiovascular: RRR, heart sounds normal, no murmur or gallops, no peripheral edema  Musculoskeletal: No deformities, no cyanosis or clubbing  Neuro: alert, non focal  Skin: Warm, no lesions or rashes   Assessment & Plan:  Acute bronchitis Please take levaquin 500mg  daily for 7 days  Please continue nasacort and xyzal as you are taking them  Use tylenol cold and flu as needed.  Use tessalon perles as needed Follow with Dr Lamonte Sakai next available   Asthma No evidence of acute exacerbation at this time. No wheezing. Lesion is treated for bronchitis but will defer steroids  Continue symbicort twice a day  Please use albuterol nebulizer as needed  Allergic rhinitis Continue current allergy routine. Consider adding nasal saline washes in the future. She doesn't believe these have helped her

## 2015-08-01 NOTE — Assessment & Plan Note (Signed)
Continue current allergy routine. Consider adding nasal saline washes in the future. She doesn't believe these have helped her

## 2015-08-01 NOTE — Assessment & Plan Note (Signed)
No evidence of acute exacerbation at this time. No wheezing. Lesion is treated for bronchitis but will defer steroids  Continue symbicort twice a day  Please use albuterol nebulizer as needed

## 2015-08-18 ENCOUNTER — Ambulatory Visit: Payer: Medicare Other | Admitting: Emergency Medicine

## 2015-09-13 ENCOUNTER — Other Ambulatory Visit: Payer: Self-pay | Admitting: Endocrinology

## 2015-09-19 ENCOUNTER — Other Ambulatory Visit (INDEPENDENT_AMBULATORY_CARE_PROVIDER_SITE_OTHER): Payer: Medicare Other

## 2015-09-19 DIAGNOSIS — IMO0002 Reserved for concepts with insufficient information to code with codable children: Secondary | ICD-10-CM

## 2015-09-19 DIAGNOSIS — E1165 Type 2 diabetes mellitus with hyperglycemia: Secondary | ICD-10-CM | POA: Diagnosis not present

## 2015-09-19 LAB — COMPREHENSIVE METABOLIC PANEL
ALT: 23 U/L (ref 0–35)
AST: 27 U/L (ref 0–37)
Albumin: 3.8 g/dL (ref 3.5–5.2)
Alkaline Phosphatase: 51 U/L (ref 39–117)
BUN: 25 mg/dL — ABNORMAL HIGH (ref 6–23)
CO2: 30 mEq/L (ref 19–32)
Calcium: 9.9 mg/dL (ref 8.4–10.5)
Chloride: 102 mEq/L (ref 96–112)
Creatinine, Ser: 1.47 mg/dL — ABNORMAL HIGH (ref 0.40–1.20)
GFR: 37.35 mL/min — ABNORMAL LOW (ref >60.00)
Glucose, Bld: 151 mg/dL — ABNORMAL HIGH (ref 70–99)
Potassium: 4.5 mEq/L (ref 3.5–5.1)
Sodium: 139 mEq/L (ref 135–145)
Total Bilirubin: 1 mg/dL (ref 0.2–1.2)
Total Protein: 6.9 g/dL (ref 6.0–8.3)

## 2015-09-19 LAB — MICROALBUMIN / CREATININE URINE RATIO
Creatinine,U: 188.5 mg/dL
Microalb Creat Ratio: 12.9 mg/g (ref 0.0–30.0)
Microalb, Ur: 24.3 mg/dL — ABNORMAL HIGH (ref 0.0–1.9)

## 2015-09-19 LAB — HEMOGLOBIN A1C: Hgb A1c MFr Bld: 7.8 % — ABNORMAL HIGH (ref 4.6–6.5)

## 2015-09-21 ENCOUNTER — Encounter: Payer: Self-pay | Admitting: Endocrinology

## 2015-09-21 ENCOUNTER — Ambulatory Visit (INDEPENDENT_AMBULATORY_CARE_PROVIDER_SITE_OTHER): Payer: Medicare Other | Admitting: Endocrinology

## 2015-09-21 VITALS — BP 118/60 | HR 79 | Temp 98.6°F | Resp 14 | Ht 66.0 in | Wt 202.0 lb

## 2015-09-21 DIAGNOSIS — E1165 Type 2 diabetes mellitus with hyperglycemia: Secondary | ICD-10-CM | POA: Diagnosis not present

## 2015-09-21 DIAGNOSIS — Z794 Long term (current) use of insulin: Secondary | ICD-10-CM

## 2015-09-21 NOTE — Progress Notes (Signed)
During Patient ID: Hannah [name], female   DOB: May 19, 1946, 70 y.o.   MRN: [id number]           Reason for Appointment: Follow-up for Type 2 Diabetes  Referring physician: Suzy Bouchard  History of Present Illness:          Date of diagnosis of type 2 diabetes mellitus: Late 1980s        Background history:  She was previously treated with metformin prior to starting insulin She thinks she has been on insulin for at least 10 years and mostly taking Lantus and other types of insulin with it Her metformin was stopped when she started insulin and also has had renal dysfunction Generally she has had difficulty controlling her diabetes  Recent history:   INSULIN regimen is described as:  V-go 40 basal, mealtime coverage 8 u ac B and 8-10 acL, 12-14 acS  Previously her A1c had been consistently over 8% and occasionally higher. Because of poor control she was switched from Januvia to [place] on her initial consultation in 5/16 Subsequently she has been on Trulicity 2.20 mg weekly because of nausea with Victoza  Current blood sugar patterns and problems identified:   She is doing well with her V-go pump and has no complaints now  She did not bring her blood sugar readings for review today  A1c is higher at 7.8 and not clear why  However more recently in the last 2 weeks or so she has started a Weight Watchers program and lower carbohydrate intake.  She thinks she has lost about 3 pounds  She thinks she is starting to get low blood sugars during the night list blood sugars close to 200 or if she has a snack.  This is recent.  Also she thinks that the FASTING blood sugars now are below 100  Does not remember blood sugars after breakfast or lunch  Bedtime readings are mildly increased but she will still eat of fruit or other snack at bedtime for prevention of hypoglycemia  She does get up early in the mornings 3 days a week for work which is Wednesdays, Thursdays and Sundays  Currently  getting her insulin pumps a mail-order company     Oral hypoglycemic drugs the patient is taking are: None Side effects from medications have been: None  Compliance with the medical regimen: Fair   Glucose monitoring:  done about 3 times a day         Glucometer:  Accu-Chek Aviva Blood Glucose readings by recall:  Mean values apply above for all meters except median for One Touch  PRE-MEAL Fasting Lunch Dinner Hs 11pm Overall  Glucose range: <100   150-200   Mean/median:         Self-care: The diet that the patient has been following is: tries to limit fats and carbs.  Eating out periodically She uses diet green tea and no sugar in drinks   Dinner 6-7 PM Typical meal intake: Breakfast is eggs              Dietician visit, most recent: 5/16               Exercise: Water exercises 3/7 days in 9/16  Weight history:  Wt Readings from Last 3 Encounters:  09/21/15 202 lb (91.627 kg)  08/01/15 202 lb (91.627 kg)  07/21/15 203 lb (92.08 kg)    Glycemic control:      Lab Results  Component Value Date   HGBA1C 7.8*  09/19/2015   HGBA1C 7.2 06/23/2015   HGBA1C 7.7 01/05/2015   Lab Results  Component Value Date   MICROALBUR 24.3* 09/19/2015   LDLCALC 58 07/05/2015   CREATININE 1.47* 09/19/2015         Medication List       This list is accurate as of: 09/21/15  4:14 PM.  Always use your most recent med list.               ACCU-CHEK AVIVA PLUS test strip  Generic drug:  glucose blood     albuterol (2.5 MG/3ML) 0.083% nebulizer solution  Commonly known as:  PROVENTIL  Take 3 mLs (2.5 mg total) by nebulization every 6 (six) hours as needed for wheezing or shortness of breath.     VENTOLIN HFA 108 (90 Base) MCG/ACT inhaler  Generic drug:  albuterol  INHALE TWO PUFFS INTO LUNGS EVERY 4 HOURS AS NEEDED     ALPRAZolam 0.25 MG tablet  Commonly known as:  XANAX  Take 1 tablet (0.25 mg total) by mouth at bedtime as needed for sleep.     aspirin 81 MG EC tablet    Take 1 tablet (81 mg total) by mouth daily.     budesonide-formoterol 160-4.5 MCG/ACT inhaler  Commonly known as:  SYMBICORT  INHALE TWO PUFFS INTO LUNGS TWICE DAILY     clobetasol 0.05 % external solution  Commonly known as:  TEMOVATE     clopidogrel 75 MG tablet  Commonly known as:  PLAVIX  Take 75 mg by mouth daily.     ezetimibe 10 MG tablet  Commonly known as:  ZETIA  Take 1 tablet (10 mg total) by mouth daily.     furosemide 40 MG tablet  Commonly known as:  LASIX  Take 0.5 tablets (20 mg total) by mouth 2 (two) times daily.     insulin lispro 100 UNIT/ML injection  Commonly known as:  HUMALOG  INJECT 76 UNITS INTO THE SKIN PER DAY WITH V-GO PUMP     levocetirizine 5 MG tablet  Commonly known as:  XYZAL  Take 1 tablet by mouth daily.     metoprolol succinate 100 MG 24 hr tablet  Commonly known as:  TOPROL-XL  TAKE ONE TABLET BY MOUTH TWICE DAILY TAKE  WITH  OR  IMMEDIATELY  FOLLOWING  A  MEAL     rosuvastatin 40 MG tablet  Commonly known as:  CRESTOR  TAKE ONE TABLET BY MOUTH ONCE DAILY     traMADol 50 MG tablet  Commonly known as:  ULTRAM  Take 50 mg by mouth every 8 (eight) hours as needed for moderate pain.     triamcinolone 55 MCG/ACT Aero nasal inhaler  Commonly known as:  NASACORT  Place 2 sprays into the nose daily.     TRULICITY 6.04 VW/0.9WJ Sopn  Generic drug:  Dulaglutide  Inject subcutaneously 0.75  mg once weekly     V-GO 40 Kit  Use 1 per day        Allergies:  Allergies  Allergen Reactions  . Imdur [Isosorbide Dinitrate] Other (See Comments)    Headache/ severe hypotension  . Amoxicillin Rash  . Tetracycline Rash    Past Medical History  Diagnosis Date  . Hypertension   . Hyperlipidemia   . CHF (congestive heart failure) (HCC)     EF is low normal at 50 to 55% per echo in Jan. 2011  . Renal insufficiency, mild   . Nonischemic cardiomyopathy ([place])   .  Anemia   . Iron deficiency anemia   . S/P AVR     prosthesis valve  placement 2009 at same time re-do CABG  . Hearing loss of right ear   . PAF (paroxysmal atrial fibrillation) ([place])   . Bilateral carotid artery stenosis     Bilateral ICA 40-59%/  >50% LECA  . Dyspnea   . CAD (coronary artery disease) cardiologist-  dr Aundra Dubin    Remote CABG in 1998 with redo in 2009  . Type 2 diabetes mellitus ([place])     monitored by dr Dwyane Dee  . Renal calculus, right   . History of non-ST elevation myocardial infarction (NSTEMI)     Sept 2014--  thought to be type II HTN w/ LHC without infarct related artery and patent grafts  . PONV (postoperative nausea and vomiting)     pt states patch worked well before  . Stroke [facility])     residual rt hearing loss  . Moderate persistent asthma     pulmologist-  Dr. Malvin Johns  . GERD (gastroesophageal reflux disease)   . Nausea & vomiting     related to stone  . Abnormally small mouth   . Bruises easily   . Psoriasis     right leg    Past Surgical History  Procedure Laterality Date  . [facility]ortic valve [facility]  [facility] same time is [facility]G)  . Left heart catheterization with coronary/graft angiogram N/A 02/23/2013    Procedure: LEFT HEART CATHETERIZATION WITH CBeatrix Fetters  Surgeon: MBlane Ohara MD;  [facility];  Service: Cardiovascular;  Laterality: N/A;  . Coronary artery bypass graft  1998 &  re-do 2009    Had LIMA to DX/LAD, [facility] 2 marginal [facility]; SVG to 3rd OM and PD at time of redo  . Cardiac catheterization  03/23/2008    Pre-redo CABG: L main OK, LAD (T), CFX (T), OM1 99%, RCA (T), LIMA-LAD OK, SVG-OM(?3) OK w/ little florw to OM2, SVG-RCA OK. EF NL  . Tubal ligation    . Tonsillectomy    . Carpal tunnel release    . Transthoracic echocardiogram  02-24-2013       mild LVH,  ef 50-55%/  AV bioprosthesis was present with very mild stenosis and no regurg., mean grandient 181mg, peak grandient 2042m /  mild MR/  mild LAE  and RAE/  moderate TR  . Cardiovascular stress test  06-15-2013      dr hilDebara Pickett LexCarlton Adamrfusion study/  no evidence ischemia/  normal wall motion, ef 72%  . Eye surgery Bilateral     cataracts  . Cystoscopy w/ ureteral stent placement Right 12/20/2014    Procedure: CYSTOSCOPY WITH RETROGRADE PYELOGRAM/URETERAL STENT PLACEMENT;  Surgeon: PatCleon GustinD;  [facility];  Laterality: Right;  . Holmium laser application Right 12/28/50/8413 Procedure:  HOLMIUM LASER LITHOTRIPSY;  Surgeon: PatCleon GustinD;  [facility];  Laterality: Right;    Family History  Problem Relation Age of Onset  . Heart disease Mother   . Heart disease Father   . Heart failure Father   . Diabetes Son   . Diabetes Maternal Grandmother     Social History:  reports that she has never smoked. She has never used smokeless tobacco. She reports that she does not drink alcohol  or use illicit drugs.    Review of Systems    Lipid history: On 40 mg Crestor along with Zetia since she has had her coronary artery disease diagnosis    Lab Results  Component Value Date   CHOL 116* 07/05/2015   HDL 38* 07/05/2015   LDLCALC 58 07/05/2015   LDLDIRECT 109.2 03/01/2014   TRIG 99 07/05/2015   CHOLHDL 3.1 07/05/2015           Eyes:   Most recent eye exam was 2016 and was not told to have any retinopathy  Hypertension: Blood pressure is well controlled now Previously and she is taking metoprolol and Lasix Norvasc was stopped previously because of low normal blood pressure  RENAL insufficiency: Appears unchanged  Diabetic foot exam in 5/16 shows She has complete loss of monofilament sensation in her distal feet with only mild sensitivity present on the right first and second toes  Physical Examination:  BP 118/60 mmHg  Pulse 79  Temp(Src) 98.6 F (37 C)  Resp 14  Ht 5' 6"  (1.676 m)  Wt 202 lb (91.627 kg)  BMI  32.62 kg/m2  SpO2 98%   ASSESSMENT:  Diabetes type 2, uncontrolled   See history of present illness for detailed discussion of his current management, blood sugar patterns and problems identified  She has Overall fairly good control with V-go pump and recently has had no complaints about burning with boluses However A1c is higher at 7.8 and not clear why Unable to download her meter, previously has had significant fluctuation in her blood sugars She appears to be more motivated to watch her diet better for weight loss and reducing insulin  She reports relatively low sugars overnight including some hypoglycemia especially she is not eating a snack at night or the blood sugars around 200 at bedtime  She also is benefiting from continuing Trulicity 0.26 mg which helps her watch her diet although has not lost any weight recently   PLAN:   Trial of the:30 unit basal V-go pump.  Discussed how basal rate is different and would reduce overnight hypoglycemia tendency and allow her to work on her mealtime coverage and not have extra snacks at night  She will try to take extra 2 units of insulin at suppertime and try to check her sugars 2 hours after eating rather than 4-5 hours  Discussed blood sugar targets at various times  Bring blood sugar monitor on the next visit for download   Patient Instructions  Check blood sugars on waking up 4-5 times a week Also check blood sugars about 2 hours after a meal and do this after different meals by rotation  Recommended blood sugar levels on waking up is 90-130 and about 2 hours after meal is 130-160  Please bring your blood sugar monitor to each visit, thank you      Counseling time on subjects discussed above is over 50% of today's 25 minute visit   Urie Loughner 09/21/2015, 4:14 PM   Note: This office note was prepared with Dragon voice recognition system technology. Any transcriptional errors that result from this process are  unintentional.

## 2015-09-21 NOTE — Patient Instructions (Signed)
Check blood sugars on waking up 4-5 times a week Also check blood sugars about 2 hours after a meal and do this after different meals by rotation  Recommended blood sugar levels on waking up is 90-130 and about 2 hours after meal is 130-160  Please bring your blood sugar monitor to each visit, thank you

## 2015-09-28 ENCOUNTER — Ambulatory Visit (INDEPENDENT_AMBULATORY_CARE_PROVIDER_SITE_OTHER): Payer: Medicare Other | Admitting: Cardiology

## 2015-09-28 ENCOUNTER — Encounter: Payer: Self-pay | Admitting: Cardiology

## 2015-09-28 VITALS — BP 124/62 | HR 77 | Ht 66.0 in | Wt 201.0 lb

## 2015-09-28 DIAGNOSIS — I471 Supraventricular tachycardia: Secondary | ICD-10-CM

## 2015-09-28 DIAGNOSIS — Z954 Presence of other heart-valve replacement: Secondary | ICD-10-CM

## 2015-09-28 DIAGNOSIS — I2510 Atherosclerotic heart disease of native coronary artery without angina pectoris: Secondary | ICD-10-CM

## 2015-09-28 DIAGNOSIS — N183 Chronic kidney disease, stage 3 (moderate): Secondary | ICD-10-CM | POA: Diagnosis not present

## 2015-09-28 DIAGNOSIS — Z952 Presence of prosthetic heart valve: Secondary | ICD-10-CM

## 2015-09-28 NOTE — Patient Instructions (Signed)
Your physician recommends that you continue on your current medications as directed. Please refer to the Current Medication list given to you today. Your physician has requested that you have an echocardiogram. Echocardiography is a painless test that uses sound waves to create images of your heart. It provides your doctor with information about the size and shape of your heart and how well your heart's chambers and valves are working. This procedure takes approximately one hour. There are no restrictions for this procedure.  Your physician wants you to follow-up in: [place]. Aundra Dubin.  You will receive a reminder letter in the mail two months in advance. If you don't receive a letter, please call our office to schedule the follow-up appointment.

## 2015-09-29 NOTE — Progress Notes (Signed)
Patient ID: Hannah [name], female   DOB: September 18, 1945, 70 y.o.   MRN: [id number] PCP: Dr. Suzy Bouchard  70 yo with history of CAD s/p CABG, bioprosthetic AVR, CKD, paroxysmal atrial fibrillation presents for cardiology followup.  She had her initial CABG in 1998, then had redo CABG in 2009 with bioprosthetic AVR.  She does not remember when she last had documented atrial fibrillation, but she thinks it was a number of years ago, sounds peri-operative around the time of her redo CABG.  She was never put on anticoagulation but had been on dronedarone.    In 9/14, she developed tachypalpitations and later chest pain.  In the ER, SBP was in the 200s with elevated HR. Troponin was mildly elevated.  Therefore, she had a cardiac cath showing patent grafts.  She was thought to have had demand ischemia in the setting of hypertensive emergency + tachycardia.  After discharge, she continued to have runs of tachypalpitations.  A 3 week event monitor was put on her.  This showed runs, sometimes long, of atrial tachycardia. She was started on Toprol XL when these runs were noted.  No further palpitations (complete resolution of symptoms).    In 1/15, she was admitted to [facility] with atypical chest pain.  She was ruled out for MI and Lexiscan Cardiolite showed no ischemia.  She was started on Imdur and sent home.  After discharge, she developed a daily headache and nausea.  She did not eat or drink much due to the nausea. Shortly after this, she was standing in her grandson's pediatrician's office and became severely lightheaded.  She had to sit down.  She came to the ER and was found to be orthostatic.  She was given IV fluid and got better quickly.  I had her stop Imdur and symptoms seem to have completely resolved.    Event monitor in 8/16 showed atrial tachycardia versus atypical atrial flutter.  Toprol XL was increased to 100 mg bid.  She saw Dr Lovena Le who thought that she had short runs of atrial tachycardia and not  atrial flutter.  Therefore, she has not been anticoagulated.  She still has occasional brief palpitations but overall is doing better.  No lightheadedness or syncope.  No chest pain.  No exertional dyspnea on flat ground.  Mild dyspnea with steps.  She has not been taking Lasix.  Weight is stable. Occasional wheezing.    ECG: NSR, nonspecific T wave flattening.   Labs (2/13): BNP 80, K 5.3, creatinine 1.1 Labs (6/13): LDL 76, HDL 41 Labs (8/13): K 4.3, creatinine 1.38 Labs (9/13): creatinine 1.6, proBNP 128 Labs (9/14): K 4.7, creatinine 1.5, LDL 82, HDL 35 Labs (10/14): K 4.8, creatinine 1.5 Labs (1/15): LDL 105, HDL 37 Labs (2/15): K 4.1, creatinine 1.28, HCT 34 Labs (4/15): K 4.2, creatinine 1.5 => 1.4, BNP 95 Labs (1/16): K 3.8, creatinine 1.6, Hgb 10.5 Labs (7/16): K 3.7, creatinine 1.77, LDL 49, HDL 27 Labs (1/17): LDL 58, HDL 38, HCT 35.4 Labs (4/17): K 4.5, creatinine 1.47  PMH: 1. Diabetes mellitus 2. HTN 3. Obesity 4. Atrial arrhythmias: Paroxysmal atrial fibrillation seems to have been post-operative after CAD-AVR. She has a history of amiodarone toxicity. She has not been on coumadin.  3-week event monitor (9/13) with no atrial fibrillation.  3-week event monitor (10/14) with runs of probable atrial tachycardia.  30 day monitor in 8/16 with atrial tachycardia (multiple episodes).   5. CAD: CABG 1998 with LIMA-LAD/diagonal, sequential SVG-OM1/OM2, SVG-RCA.  Redo  CABG 2009 with SVG-OM3, SVG-PDA.  Lexiscan myoview (9/13): EF 61%, no ischemia or infarction.  LHC (10/14) with all grafts patent except the old SVG-RCA.  There was 90% stenosis in the AV LCx before ungrafted MOM, but this was unchanged from prior study.  Lexiscan Cardiolite (1/15) with no ischemia. She does not tolerate Imdur.  6. CKD 7. Bioprosthetic aortic valve: Echo (2011) with EF 50-55%, bioprosthetic aortic valve well-seated.  Echo (2/13) with EF 60-65%, bioprosthetic aortic valve looked ok.  Echo (9/14): EF 50-55%,  mild LVH, bioprosthetic AVR with mean gradient 10, mild MR, moderate TR.  8. Asthma: since her teens. 9. Low back pain.  10. 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  Per neurologist, did not appear cardioembolic.   11. Carotid stenosis: 40-59% bilateral ICA stenosis on 10/14 carotid dopplers. Carotid doppler (11/15) with 40-59% bilateral ICA stenosis.  Carotid dopplers (11/16) with 40-59% BICA stenosis.  12. Sleep study 12/14 without significant OSA.  13. Chronic diastolic CHF  SH: Never smoked.  Lives in [place].  Married.    FH: No premature CAD.   ROS: All systems reviewed and negative except as per HPI.    Current Outpatient Prescriptions  Medication Sig Dispense Refill  . ACCU-CHEK AVIVA PLUS test strip     . albuterol (PROVENTIL) (2.5 MG/3ML) 0.083% nebulizer solution Take 3 mLs (2.5 mg total) by nebulization every 6 (six) hours as needed for wheezing or shortness of breath. 180 mL 0  . ALPRAZolam (XANAX) 0.25 MG tablet Take 1 tablet (0.25 mg total) by mouth at bedtime as needed for sleep. 30 tablet 0  . aspirin EC 81 MG EC tablet Take 1 tablet (81 mg total) by mouth daily.    . budesonide-formoterol (SYMBICORT) 160-4.5 MCG/ACT inhaler INHALE TWO PUFFS INTO LUNGS TWICE DAILY 1 Inhaler 11  . clopidogrel (PLAVIX) 75 MG tablet Take 75 mg by mouth daily.    Marland Kitchen ezetimibe (ZETIA) 10 MG tablet Take 1 tablet (10 mg total) by mouth daily. 90 tablet 1  . furosemide (LASIX) 40 MG tablet Take 0.5 tablets (20 mg total) by mouth 2 (two) times daily. (Patient taking differently: Take 20 mg by mouth 2 (two) times daily as needed. ) 30 tablet 5  . Insulin Disposable Pump (V-GO 40) KIT Use 1 per day 3 kit 1  . insulin lispro (HUMALOG) 100 UNIT/ML injection INJECT 76 UNITS INTO THE SKIN PER DAY WITH V-GO PUMP 90 mL 1  . levocetirizine (XYZAL) 5 MG tablet Take 1 tablet by mouth daily.    . metoprolol succinate (TOPROL-XL) 100 MG 24 hr tablet TAKE ONE TABLET BY MOUTH TWICE DAILY TAKE  WITH  OR   IMMEDIATELY  FOLLOWING  A  MEAL 180 tablet 2  . rosuvastatin (CRESTOR) 40 MG tablet TAKE ONE TABLET BY MOUTH ONCE DAILY 30 tablet 5  . traMADol (ULTRAM) 50 MG tablet Take 50 mg by mouth every 8 (eight) hours as needed for moderate pain.     Marland Kitchen triamcinolone (NASACORT) 55 MCG/ACT AERO nasal inhaler Place 2 sprays into the nose daily. 1 Inhaler 5  . TRULICITY 8.92 JJ/9.4RD SOPN Inject subcutaneously 0.75  mg once weekly 6 mL 1  . VENTOLIN HFA 108 (90 BASE) MCG/ACT inhaler INHALE TWO PUFFS INTO LUNGS EVERY 4 HOURS AS NEEDED 18 each 11   No current facility-administered medications for this visit.    BP 124/62 mmHg  Pulse 77  Ht 5' 6" (1.676 m)  Wt 201 lb (91.173 kg)  BMI 32.46 kg/m2 General: NAD Neck: Thick neck, JVP 7 cm, no thyromegaly or thyroid nodule.  Lungs: Clear to auscultation bilaterally with normal respiratory effort. CV: Nondisplaced PMI.  Heart regular S1/S2, +S4, 2/6 SEM RUSB.  No edema.  Bilateral carotid bruits.  Normal pedal pulses.  Abdomen: Soft, nontender, no hepatosplenomegaly, no distention.  Neurologic: Alert and oriented x 3.  Psych: Normal affect. Extremities: No clubbing or cyanosis.   Assessment/Plan: 1. CAD  Admission in 9/14 with mild troponin elevation.  LHC showed stable anatomy with patent grafts except for SVG-RCA from older CABG.  Suspect that the troponin elevation was due to demand ischemia in the setting of hypertensive emergency and tachycardia (possible run of atrial tachycardia).  Admission in 1/15 with atypical chest pain, had Lexiscan Cardiolite that showed no ischemia.  No recent chest pain.  - Continue ASA 81, Plavix, statin, Toprol XL.      2. Atrial Arrhythmias Patient has not been on anticoagulation. She apparently had atrial fibrillation at some point in the past. It seems that the atrial fibrillation may have been post-op after redo CABG and has not recurred. She is in NSR today. She has been noted to have atrial tachycardia but no atrial  fibrillation or flutter.  Palpitations have been better recently.  She is not anticoagulated with no documented fibrillation or flutter except post-CABG.   - Continue Toprol XL, this seems to have helped symptoms.  3. HYPERLIPIDEMIA  Good lipids on Crestor.  4. Bioprosthetic aortic valve Well-seated on last echo in 2014.  I will get a repeat echo in the next few weeks to reassess valve.  5. Carotid bruit Repeat carotid dopplers in 11/17.   6. Chronic diastolic CHF Currently not taking Lasix.  Volume status looks ok.   7. CKD Stage III. Recent BMET was stable.   Followup in 6 months.   Loralie Champagne 09/29/2015

## 2015-09-30 ENCOUNTER — Ambulatory Visit (INDEPENDENT_AMBULATORY_CARE_PROVIDER_SITE_OTHER): Payer: Medicare Other | Admitting: Emergency Medicine

## 2015-09-30 ENCOUNTER — Encounter: Payer: Self-pay | Admitting: Emergency Medicine

## 2015-09-30 VITALS — BP 110/70 | HR 76 | Ht 65.0 in | Wt 201.0 lb

## 2015-09-30 DIAGNOSIS — J4541 Moderate persistent asthma with (acute) exacerbation: Secondary | ICD-10-CM

## 2015-09-30 DIAGNOSIS — J309 Allergic rhinitis, unspecified: Secondary | ICD-10-CM

## 2015-09-30 NOTE — Patient Instructions (Signed)
Please continue Symbicort 2 puffs twice a day. Remember to rinse and gargle after taking this medication. Please continue Xyzal and Nasacort as you have been using them Take albuterol 2 puffs up to every 4 hours if needed for shortness of breath.  Follow with Dr Lamonte Sakai in 6 months or sooner if you have any problems

## 2015-09-30 NOTE — Assessment & Plan Note (Signed)
Please continue Symbicort 2 puffs twice a day. Remember to rinse and gargle after taking this medication. Take albuterol 2 puffs up to every 4 hours if needed for shortness of breath.  Follow with Dr Lamonte Sakai in 6 months or sooner if you have any problems

## 2015-09-30 NOTE — Assessment & Plan Note (Signed)
Please continue Xyzal and Nasacort as you have been using them

## 2015-09-30 NOTE — Progress Notes (Signed)
Subjective:    Patient ID: Hannah [name], female    DOB: 1945-06-14, 70 y.o.   MRN: [id number]   HPI 70 yo never smoker, hx of CAD/CABG, aortic valve replacement, A Fib. Also with hx of childhood asthma. She was diagnosed with adult asthma in her 54's in setting wheeze, SOB, cough that wouldn't go away. Was on prn SABA for a while, started on Symbicort early 2009. Had PFT's done in August 2009 and 2010 that showed mixed disease. In 10/10 FEV1 1.01L. Averages Prednisone about 3 - 4x a year. Never hospitalized or on MV due to breathing. Amiodarone stopped late 2010.   ROV 10/14/09 -- returns for f/u of her asthma, restrictive disease. Tells me that she has had PND, allergy symptoms this spring, making her mor SOB and giving her more cough.   ROV 03/07/10 -- Hx CAD/CABG, asthma and allergies. Still with some nasal congestion, doing loratadine and fluticasone spray, rarely [place]. Does some throat clearing. Taking her symbicort reliably. Uses ventolin a couple times a month.   ROV 10/04/10 -- Hx CAD/CABG, asthma and allergies. Has been having more trouble with nasal gtt and allergies and therefore more cough chest tightness. Using Symbicort. Has needed ventolin about 4x a day for last week. More SOB. Can't tolerate the NSWs.   ROV 04/24/11 -- Hx CAD/CABG, asthma and allergies. Here for follow up. She was treated over phone for bronchitis after URI - better after doxy and prednisone. Still on Symbicort bid. She is much improved, back to baseline. She has had the flu shot. She is rarely using SABA - was requiring frequently when she was exacerbated. She is having several AE a year.   ROV 02/22/12 -- Hx CAD/CABG, asthma and allergies. She is undergoing eval for HA, nausea, imbalance. May have had a small CVA - had MRI and neuro eval. Sometimes these episodes are associated with SOB. She remains on Symbicort.  She is on loratadine, uses nasal steroid prn.   ROV 09/09/12 -- Hx CAD/CABG, asthma and allergies. She is  starting to have nasal gtt and cough. She can't do [place]. Interested in changing loratadine to alternative. She doesn't like the nasal steroids because they run down her throat. Still on Symbicort.   ROV 03/17/13 -- Hx CAD/CABG, asthma and allergies.  Returns for f/u. She keeps a baseline cough. No asthma flares since last time. Remains on Symbicort, needs a spacer. Went back to loratadine. Not using nasal washes or steroids. She has had Flu Shot.   ROV 01/12/14 -- follows for asthma, allergies, cough. She tells me that she has been doing well, is on symbicort, rarely needs albuterol. Takes loratadine, not currently on nasal steroid. She had Prevnar-13 in Sept '14. No exacerbations!  ROV 07/21/15 -- patient has a history of rhinitis, chronic cough, mixed obstruction and restriction on pulmonary function testing from 2010. She was seen in our office for an acute exacerbation approximately a year ago and then again in May 2016. I have not seen her since August 2015. She is reliable with her Symbicort.  She is on xyzal. She had a sick contact from grandchild. She is having some increase in nasal gtt, scratchy throat.  She has nasacort but isn't currently using.   ROV 08/01/15 -- follow-up visit for patient with a history of chronic rhinitis, chronic cough, mixed restriction and obstruction on pulmonary function testing. At her last visit she was having increased rhinitis and we restarted Nasacort, continued xyzal, started chlorpheniramine. She was initially  doing better until about 3 nights ago, had more cough, more dyspnea. Having L > R rib soreness. She is coughing up brown mucous x 3 days, no fever.   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 She also has a history of coronary artery disease, atrial fibrillation, chronic diastolic CHF.  She has remained on xyzal, nasacort. She is using Symbicort. She has ventolin that she rarely uses. She is  doing quite well recently. She does have some hoarse voice and some drainage but is has not manifested itself as severe cough as it has done in past years. She has been avoiding the outside exposures to the best of her ability.  Review of Systems as per HPI    Objective:   Physical Exam Filed Vitals:   09/30/15 1118  BP: 110/70  Pulse: 76  Height: 5\' 5"  (1.651 m)  Weight: 201 lb (91.173 kg)  SpO2: 99%   Gen: Pleasant, obese, in no distress,  normal affect  ENT: No lesions,  mouth clear,  oropharynx clear, no postnasal drip  Neck: No JVD, no TMG, no carotid bruits  Lungs: No use of accessory muscles, clear without rales or rhonchi  Cardiovascular: RRR, mechanical second heart sound  Musculoskeletal: No deformities, no cyanosis or clubbing  Neuro: alert, non focal  Skin: Warm, no lesions or rashes   Assessment & Plan:  Asthma Please continue Symbicort 2 puffs twice a day. Remember to rinse and gargle after taking this medication. Take albuterol 2 puffs up to every 4 hours if needed for shortness of breath.  Follow with Dr Lamonte Sakai in 6 months or sooner if you have any problems   Allergic rhinitis Please continue Xyzal and Nasacort as you have been using them   Baltazar Apo, MD, PhD 09/30/2015, 11:41 AM [facility] [phone] or if no answer [phone]

## 2015-10-13 ENCOUNTER — Other Ambulatory Visit: Payer: Self-pay

## 2015-10-13 ENCOUNTER — Ambulatory Visit (HOSPITAL_COMMUNITY): Payer: Medicare Other | Attending: Cardiology

## 2015-10-13 DIAGNOSIS — Z6833 Body mass index (BMI) 33.0-33.9, adult: Secondary | ICD-10-CM | POA: Diagnosis not present

## 2015-10-13 DIAGNOSIS — I1310 Hypertensive heart and chronic kidney disease without heart failure, with stage 1 through stage 4 chronic kidney disease, or unspecified chronic kidney disease: Secondary | ICD-10-CM | POA: Insufficient documentation

## 2015-10-13 DIAGNOSIS — N189 Chronic kidney disease, unspecified: Secondary | ICD-10-CM | POA: Diagnosis not present

## 2015-10-13 DIAGNOSIS — Z951 Presence of aortocoronary bypass graft: Secondary | ICD-10-CM | POA: Diagnosis not present

## 2015-10-13 DIAGNOSIS — I059 Rheumatic mitral valve disease, unspecified: Secondary | ICD-10-CM | POA: Insufficient documentation

## 2015-10-13 DIAGNOSIS — Z954 Presence of other heart-valve replacement: Secondary | ICD-10-CM | POA: Diagnosis not present

## 2015-10-13 DIAGNOSIS — E1122 Type 2 diabetes mellitus with diabetic chronic kidney disease: Secondary | ICD-10-CM | POA: Insufficient documentation

## 2015-10-13 DIAGNOSIS — Z952 Presence of prosthetic heart valve: Secondary | ICD-10-CM

## 2015-10-13 DIAGNOSIS — Z953 Presence of xenogenic heart valve: Secondary | ICD-10-CM | POA: Diagnosis not present

## 2015-10-13 DIAGNOSIS — E669 Obesity, unspecified: Secondary | ICD-10-CM | POA: Insufficient documentation

## 2015-10-13 DIAGNOSIS — I359 Nonrheumatic aortic valve disorder, unspecified: Secondary | ICD-10-CM | POA: Diagnosis present

## 2015-10-19 ENCOUNTER — Encounter: Payer: Self-pay | Admitting: Endocrinology

## 2015-10-19 ENCOUNTER — Ambulatory Visit (INDEPENDENT_AMBULATORY_CARE_PROVIDER_SITE_OTHER): Payer: Medicare Other | Admitting: Endocrinology

## 2015-10-19 VITALS — BP 134/74 | HR 78 | Temp 98.6°F | Resp 16 | Ht 65.0 in | Wt 200.2 lb

## 2015-10-19 DIAGNOSIS — M8589 Other specified disorders of bone density and structure, multiple sites: Secondary | ICD-10-CM

## 2015-10-19 DIAGNOSIS — E1165 Type 2 diabetes mellitus with hyperglycemia: Secondary | ICD-10-CM | POA: Diagnosis not present

## 2015-10-19 DIAGNOSIS — Z794 Long term (current) use of insulin: Secondary | ICD-10-CM | POA: Diagnosis not present

## 2015-10-19 HISTORY — DX: Other specified disorders of bone density and structure, multiple sites: M85.89

## 2015-10-19 NOTE — Patient Instructions (Signed)
Extra click at supper Avoid cereal at bedtime

## 2015-10-19 NOTE — Progress Notes (Signed)
During Patient ID: Hannah [name], female   DOB: Oct 08, 1945, 70 y.o.   MRN: [id number]           Reason for Appointment: Follow-up for Type 2 Diabetes  Referring physician: Suzy Bouchard  History of Present Illness:          Date of diagnosis of type 2 diabetes mellitus: Late 1980s        Background history:  She was previously treated with metformin prior to starting insulin She thinks she has been on insulin for at least 10 years and mostly taking Lantus and other types of insulin with it Her metformin was stopped when she started insulin and also has had renal dysfunction Generally she has had difficulty controlling her diabetes  Recent history:   INSULIN regimen is described as:  V-go 40 basal, mealtime coverage 8 u ac B and 8-10 acL, 12-14 acS  Previously her A1c had been consistently over 8% and occasionally higher. Because of poor control she was switched from Januvia to [place] on her initial consultation in 5/16 Subsequently she has been on Trulicity 8.25 mg weekly because of nausea with Victoza Most recent A1c 7.8  Current blood sugar patterns and problems identified:   She was having problems with low blood sugars including overnight after starting her Weight Watchers program and was told to switch to 30 units V-go pump  However she saw some slightly higher readings in the mornings with the 30 units pump and went back to the 40 units  Again she had a couple of overnight low sugars with this but none for the last week or so and she wants to stay on the 40 unit pump.  HYPERGLYCEMIA is present mostly late in the evenings after supper even with her taking 12-14 units bolus  She did  bring her blood sugar readings for review today, did not have them on the last visit  Usually blood sugars are not consistently abnormal around lunchtime and generally fairly good at suppertime  Currently getting her insulin pumps a mail-order company     Oral hypoglycemic drugs the patient is  taking are: None Side effects from medications have been: None  Compliance with the medical regimen: Fair  Glucose monitoring:  done about 3 times a day         Glucometer:  Accu-Chek [facility] Blood Glucose readings by download for the last 4 weeks:  Mean values apply above for all meters except median for One Touch  PRE-MEAL Fasting Lunch Dinner Bedtime Overall  Glucose range: 58-174   96-177  129-307    Mean/median: 110  149   199  144    Self-care: The diet that the patient has been following is: tries to limit fats and carbs.  Eating out periodically She uses diet green tea and no sugar in drinks   Lunch 1-2 pm Dinner 6-7 PM; Hs snack  Typical meal intake: Breakfast is eggs usually             Dietician visit, most recent: 5/16               Exercise: Water exercises 3/7 days in 9/16  Weight history:  Wt Readings from Last 3 Encounters:  10/19/15 200 lb 3.2 oz (90.81 kg)  09/30/15 201 lb (91.173 kg)  09/28/15 201 lb (91.173 kg)    Glycemic control:      Lab Results  Component Value Date   HGBA1C 7.8* 09/19/2015   HGBA1C 7.2 06/23/2015  HGBA1C 7.7 01/05/2015   Lab Results  Component Value Date   MICROALBUR 24.3* 09/19/2015   LDLCALC 58 07/05/2015   CREATININE 1.47* 09/19/2015         Medication List       This list is accurate as of: 10/19/15 11:59 PM.  Always use your most recent med list.               ACCU-CHEK AVIVA PLUS test strip  Generic drug:  glucose blood     albuterol (2.5 MG/3ML) 0.083% nebulizer solution  Commonly known as:  PROVENTIL  Take 3 mLs (2.5 mg total) by nebulization every 6 (six) hours as needed for wheezing or shortness of breath.     VENTOLIN HFA 108 (90 Base) MCG/ACT inhaler  Generic drug:  albuterol  INHALE TWO PUFFS INTO LUNGS EVERY 4 HOURS AS NEEDED     ALPRAZolam 0.25 MG tablet  Commonly known as:  XANAX  Take 1 tablet (0.25 mg total) by mouth at bedtime as needed for sleep.     aspirin 81 MG EC tablet  Take 1  tablet (81 mg total) by mouth daily.     budesonide-formoterol 160-4.5 MCG/ACT inhaler  Commonly known as:  SYMBICORT  INHALE TWO PUFFS INTO LUNGS TWICE DAILY     clopidogrel 75 MG tablet  Commonly known as:  PLAVIX  Take 75 mg by mouth daily.     ezetimibe 10 MG tablet  Commonly known as:  ZETIA  Take 1 tablet (10 mg total) by mouth daily.     furosemide 40 MG tablet  Commonly known as:  LASIX  Take 0.5 tablets (20 mg total) by mouth 2 (two) times daily.     insulin lispro 100 UNIT/ML injection  Commonly known as:  HUMALOG  INJECT 76 UNITS INTO THE SKIN PER DAY WITH V-GO PUMP     levocetirizine 5 MG tablet  Commonly known as:  XYZAL  Take 1 tablet by mouth daily.     metoprolol succinate 100 MG 24 hr tablet  Commonly known as:  TOPROL-XL  TAKE ONE TABLET BY MOUTH TWICE DAILY TAKE  WITH  OR  IMMEDIATELY  FOLLOWING  A  MEAL     rosuvastatin 40 MG tablet  Commonly known as:  CRESTOR  TAKE ONE TABLET BY MOUTH ONCE DAILY     traMADol 50 MG tablet  Commonly known as:  ULTRAM  Take 50 mg by mouth every 8 (eight) hours as needed for moderate pain.     triamcinolone 55 MCG/ACT Aero nasal inhaler  Commonly known as:  NASACORT  Place 2 sprays into the nose daily.     TRULICITY 2.54 DI/2.6EB Sopn  Generic drug:  Dulaglutide  Inject subcutaneously 0.75  mg once weekly     V-GO 40 Kit  Use 1 per day        Allergies:  Allergies  Allergen Reactions  . Imdur [Isosorbide Dinitrate] Other (See Comments)    Headache/ severe hypotension  . Amoxicillin Rash  . Tetracycline Rash    Past Medical History  Diagnosis Date  . Hypertension   . Hyperlipidemia   . CHF (congestive heart failure) (HCC)     EF is low normal at 50 to 55% per echo in Jan. 2011  . Renal insufficiency, mild   . Nonischemic cardiomyopathy ([place])   . Anemia   . Iron deficiency anemia   . S/P AVR     prosthesis valve placement 2009 at same time re-do CABG  .  Hearing loss of right ear   . PAF  (paroxysmal atrial fibrillation) ([place])   . Bilateral carotid artery stenosis     Bilateral ICA 40-59%/  >50% LECA  . Dyspnea   . CAD (coronary artery disease) cardiologist-  dr Aundra Dubin    Remote CABG in 1998 with redo in 2009  . Type 2 diabetes mellitus ([place])     monitored by dr Dwyane Dee  . Renal calculus, right   . History of non-ST elevation myocardial infarction (NSTEMI)     Sept 2014--  thought to be type II HTN w/ LHC without infarct related artery and patent grafts  . PONV (postoperative nausea and vomiting)     pt states patch worked well before  . Stroke [facility])     residual rt hearing loss  . Moderate persistent asthma     pulmologist-  Dr. Malvin Johns  . GERD (gastroesophageal reflux disease)   . Nausea & vomiting     related to stone  . Abnormally small mouth   . Bruises easily   . Psoriasis     right leg    Past Surgical History  Procedure Laterality Date  . Aortic valve [facility]  [facility]    [facility]done same time is [facility]G)  . Left heart catheterization with coronary/graft angiogram N/A 02/23/2013    Procedure: LEFT HEART CATHETERIZATION WITH CBeatrix Fetters  Surgeon: MBlane Ohara MD;  [facility];  Service: Cardiovascular;  Laterality: N/A;  . Coronary artery bypass graft  1998 &  re-do 2009    Had LIMA to DX/LAD, [facility] marginal [facility]; SVG to 3rd OM and PD at time of redo  . Cardiac catheterization  03/23/2008    Pre-redo CABG: L main OK, LAD (T), CFX (T), OM1 99%, RCA (T), LIMA-LAD OK, SVG-OM(?3) OK w/ little florw to OM2, SVG-RCA OK. EF NL  . Tubal ligation    . Tonsillectomy    . Carpal tunnel release    . Transthoracic echocardiogram  02-24-2013       mild LVH,  ef 50-55%/  AV bioprosthesis was present with very mild stenosis and no regurg., mean grandient 156mg, peak grandient 2067m /  mild MR/  mild LAE and RAE/  moderate TR  . Cardiovascular stress test  06-15-2013      dr  hilDebara Pickett LexCarl[place] Adamrfusion study/  no evidence ischemia/  normal wall motion, ef 72%  . Eye surgery Bilateral     cataracts  . Cystoscopy w/ ureteral stent placement Right 12/20/2014    Procedure: CYSTOSCOPY WITH RETROGRADE PYELOGRAM/URETERAL STENT PLACEMENT;  Surgeon: PatCleon GustinD;  [facility];  Laterality: Right;  . Holmium laser application Right 12/26/98/1749 Procedure:  HOLMIUM LASER LITHOTRIPSY;  Surgeon: PatCleon GustinD;  [facility];  Laterality: Right;    Family History  Problem Relation Age of Onset  . Heart disease Mother   . Heart disease Father   . Heart failure Father   . Diabetes Son   . Diabetes Maternal Grandmother     Social History:  reports that she has never smoked. She has never used smokeless tobacco. She reports that she does not drink alcohol or use illicit drugs.    Review of Systems    Lipid history: On 40 mg Crestor along with Zetia since she has had her  coronary artery disease     Lab Results  Component Value Date   CHOL 116* 07/05/2015   HDL 38* 07/05/2015   LDLCALC 58 07/05/2015   LDLDIRECT 109.2 03/01/2014   TRIG 99 07/05/2015   CHOLHDL 3.1 07/05/2015           Eyes:   Most recent eye exam wasIn 2016 and was not told to have any retinopathy  Hypertension: Blood pressure is well controlled now without any specific medications except metoprolol and Lasix   Diabetic foot exam in 5/16 shows She has complete loss of monofilament sensation in her distal feet with only mild sensitivity present on the right first and second toes  Physical Examination:  BP 134/74 mmHg  Pulse 78  Temp(Src) 98.6 F (37 C)  Resp 16  Ht 5' 5"  (1.651 m)  Wt 200 lb 3.2 oz (90.81 kg)  BMI 33.31 kg/m2  SpO2 97%   ASSESSMENT:  Diabetes type 2, uncontrolled   See history of present illness for detailed discussion of  current management, blood sugar  patterns and problems identified Her blood sugar download was reviewed and blood sugar patterns were discussed with the patient in detail Discussed basic planning of her meals and snacks  She has variable blood sugars over the last month or so Even though she had initially had low sugars with starting Weight Watchers program she is again requiring the 40 unit basal with relatively good blood sugars overnight in the last week She has difficulty controlling postprandial readings and they are mostly high after supper or at bedtime These are averaging nearly 200 for the last month Discussed blood sugar targets at various times  She also is benefiting from continuing Trulicity 7.82 mg which helps her watch her diet    PLAN:   Increase bolus at suppertime by 2 units  Watch her diet N avoid high carbohydrate meals and snacks  She does not need a bedtime snack unless blood sugar low normal and advised to use some protein rather than just carbohydrate at night  She will try to avoid bolusing at bedtime even with blood sugars over 200  She will switch to the 30 units pump if again getting overnight hypoglycemia   Patient Instructions  Extra click at supper Avoid cereal at bedtime     Counseling time on subjects discussed above is over 50% of today's 25 minute visit   Dantavious Snowball 10/20/2015, 7:55 AM   Note: This office note was prepared with Dragon voice recognition system technology. Any transcriptional errors that result from this process are unintentional.

## 2015-10-24 ENCOUNTER — Other Ambulatory Visit: Payer: Self-pay | Admitting: Cardiology

## 2015-11-02 ENCOUNTER — Other Ambulatory Visit: Payer: Self-pay | Admitting: Neurosurgery

## 2015-11-02 DIAGNOSIS — M419 Scoliosis, unspecified: Secondary | ICD-10-CM

## 2015-11-11 ENCOUNTER — Other Ambulatory Visit: Payer: Medicare Other

## 2015-11-15 ENCOUNTER — Ambulatory Visit
Admission: RE | Admit: 2015-11-15 | Discharge: 2015-11-15 | Disposition: A | Discharge status: Home / Self Care | Payer: Medicare Other | Source: Ambulatory Visit | Attending: Neurosurgery | Admitting: Neurosurgery

## 2015-11-15 DIAGNOSIS — M419 Scoliosis, unspecified: Secondary | ICD-10-CM

## 2015-11-17 ENCOUNTER — Other Ambulatory Visit: Payer: Self-pay | Admitting: Endocrinology

## 2015-11-19 ENCOUNTER — Other Ambulatory Visit: Payer: Medicare Other

## 2015-12-05 ENCOUNTER — Other Ambulatory Visit: Payer: Self-pay | Admitting: Internal Medicine

## 2015-12-05 DIAGNOSIS — N63 Unspecified lump in unspecified breast: Secondary | ICD-10-CM

## 2015-12-14 ENCOUNTER — Ambulatory Visit
Admission: RE | Admit: 2015-12-14 | Discharge: 2015-12-14 | Disposition: A | Discharge status: Home / Self Care | Payer: Medicare Other | Source: Ambulatory Visit | Attending: Internal Medicine | Admitting: Internal Medicine

## 2015-12-14 ENCOUNTER — Other Ambulatory Visit: Payer: Self-pay | Admitting: Internal Medicine

## 2015-12-14 DIAGNOSIS — N63 Unspecified lump in unspecified breast: Secondary | ICD-10-CM

## 2015-12-16 ENCOUNTER — Telehealth: Payer: Self-pay | Admitting: *Deleted

## 2015-12-16 ENCOUNTER — Encounter: Payer: Self-pay | Admitting: *Deleted

## 2015-12-16 DIAGNOSIS — C50211 Malignant neoplasm of upper-inner quadrant of right female breast: Secondary | ICD-10-CM

## 2015-12-16 NOTE — Telephone Encounter (Signed)
Confirmed [facility] for 12/21/15 at 0815.  Instructions and contact information given. 

## 2015-12-21 ENCOUNTER — Ambulatory Visit
Admission: RE | Admit: 2015-12-21 | Discharge: 2015-12-21 | Disposition: A | Discharge status: Home / Self Care | Payer: Medicare Other | Source: Ambulatory Visit | Attending: Radiation Oncology | Admitting: Radiation Oncology

## 2015-12-21 ENCOUNTER — Encounter: Payer: Self-pay | Admitting: *Deleted

## 2015-12-21 ENCOUNTER — Encounter: Payer: Self-pay | Admitting: Skilled Nursing Facility1

## 2015-12-21 ENCOUNTER — Ambulatory Visit (HOSPITAL_BASED_OUTPATIENT_CLINIC_OR_DEPARTMENT_OTHER): Payer: Medicare Other

## 2015-12-21 ENCOUNTER — Telehealth: Payer: Self-pay | Admitting: Cardiology

## 2015-12-21 ENCOUNTER — Ambulatory Visit (HOSPITAL_BASED_OUTPATIENT_CLINIC_OR_DEPARTMENT_OTHER): Payer: Medicare Other | Admitting: Hematology and Oncology

## 2015-12-21 ENCOUNTER — Encounter: Payer: Self-pay | Admitting: Hematology and Oncology

## 2015-12-21 VITALS — BP 127/55 | HR 66 | Temp 98.3°F | Resp 18 | Ht 65.0 in | Wt 200.8 lb

## 2015-12-21 DIAGNOSIS — C50211 Malignant neoplasm of upper-inner quadrant of right female breast: Secondary | ICD-10-CM

## 2015-12-21 LAB — COMPREHENSIVE METABOLIC PANEL
ALT: 24 U/L (ref 0–55)
AST: 31 U/L (ref 5–34)
Albumin: 3 g/dL — ABNORMAL LOW (ref 3.5–5.0)
Alkaline Phosphatase: 56 U/L (ref 40–150)
Anion Gap: 8 mEq/L (ref 3–11)
BUN: 23.6 mg/dL (ref 7.0–26.0)
CO2: 28 mEq/L (ref 22–29)
Calcium: 9.2 mg/dL (ref 8.4–10.4)
Chloride: 105 mEq/L (ref 98–109)
Creatinine: 1.4 mg/dL — ABNORMAL HIGH (ref 0.6–1.1)
EGFR: 39 mL/min/{1.73_m2} — ABNORMAL LOW (ref >90)
Glucose: 160 mg/dl — ABNORMAL HIGH (ref 70–140)
Potassium: 5 mEq/L (ref 3.5–5.1)
Sodium: 142 mEq/L (ref 136–145)
Total Bilirubin: 0.85 mg/dL (ref 0.20–1.20)
Total Protein: 6.2 g/dL — ABNORMAL LOW (ref 6.4–8.3)

## 2015-12-21 LAB — CBC WITH DIFFERENTIAL/PLATELET
BASO%: 1.8 % (ref 0.0–2.0)
Basophils Absolute: 0.1 10*3/uL (ref 0.0–0.1)
EOS%: 11.1 % — ABNORMAL HIGH (ref 0.0–7.0)
Eosinophils Absolute: 0.6 10*3/uL — ABNORMAL HIGH (ref 0.0–0.5)
HCT: 33.3 % — ABNORMAL LOW (ref 34.8–46.6)
HGB: 10.9 g/dL — ABNORMAL LOW (ref 11.6–15.9)
LYMPH%: 23.2 % (ref 14.0–49.7)
MCH: 29.1 pg (ref 25.1–34.0)
MCHC: 32.8 g/dL (ref 31.5–36.0)
MCV: 88.7 fL (ref 79.5–101.0)
MONO#: 0.7 10*3/uL (ref 0.1–0.9)
MONO%: 12.9 % (ref 0.0–14.0)
NEUT#: 2.8 10*3/uL (ref 1.5–6.5)
NEUT%: 51 % (ref 38.4–76.8)
Platelets: 151 10*3/uL (ref 145–400)
RBC: 3.76 10*6/uL (ref 3.70–5.45)
RDW: 13.4 % (ref 11.2–14.5)
WBC: 5.6 10*3/uL (ref 3.9–10.3)
lymph#: 1.3 10*3/uL (ref 0.9–3.3)

## 2015-12-21 NOTE — Progress Notes (Signed)
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  T1cN0 stage IA clinical stage  HISTORY OF PRESENT ILLNESS::Hannah [name] is a 70 y.o. female who presents today after an abnormal routine mammogram. She had a diagnostic mammogram and ultrasound 12/14/2015, revealing an approximate 1.6 cm mass and an adjacent approximate 0.8 cm satellite nodule in the upper inner right breast. She had a biopsy 12/14/2015, revealing both masses in the right breast were invasive mammary carcinoma, grade II. The larger mass was ER (0%), PR (0%), Ki 67 (70%), and Her2-neu negative. The smaller mass was ER (5%), PR (2%), Ki 67 (50%), and Her2-neu negative. She is here today to discuss radiation treatment options.  PREVIOUS RADIATION THERAPY: No  PAST MEDICAL HISTORY:  has a past medical history of Hypertension; Hyperlipidemia; CHF (congestive heart failure) ([place]); Renal insufficiency, mild; Nonischemic cardiomyopathy ([place]); Anemia; Iron deficiency anemia; S/P AVR; Hearing loss of right ear; PAF (paroxysmal atrial fibrillation) ([place]); Bilateral carotid artery stenosis; Dyspnea; CAD (coronary artery disease) (cardiologist-  Hannah Hannah [name]); Type 2 diabetes mellitus ([place]); Renal calculus, right; History of non-ST elevation myocardial infarction (NSTEMI); PONV (postoperative nausea and vomiting); Stroke [facility]); Moderate persistent asthma; GERD (gastroesophageal reflux disease); Nausea & vomiting; Abnormally small mouth; Bruises easily; Psoriasis; Breast cancer of upper-inner quadrant of right female breast ([place]) (12/16/2015); and Breast cancer ([place]).    PAST SURGICAL HISTORY: Past Surgical History  Procedure Laterality Date  .  Aortic valve replacement  2009    [facility] same time is [facility]  . Left heart catheterization with coronary/graft angiogram N/A 02/23/2013    Procedure: LEFT HEART CATHETERIZATION WITH CBeatrix Fetters  Surgeon: Hannah [name];  [facility];  Service: Cardiovascular;  Laterality: N/A;  . Coronary artery bypass graft  1998 &  re-do 2009    Had LIMA to DX/LAD, SVG to 2 marginal branches and [facility]; SVG to 3rd OM and PD at time of redo  . Cardiac catheterization  03/23/2008    Pre-redo CABG: L main OK, LAD (T), CFX (T), OM1 99%, RCA (T), LIMA-LAD OK, SVG-OM(?3) OK w/ little florw to OM2, SVG-RCA OK. EF NL  . Tubal ligation    . Tonsillectomy    . Carpal tunnel release    . Transthoracic echocardiogram  02-24-2013       mild LVH,  ef 50-55%/  AV bioprosthesis was present with very mild stenosis and no regurg., mean grandient 15mg, peak grandient 2096m /  mild MR/  mild LAE and RAE/  moderate TR  . Cardiovascular stress test  06-15-2013      Hannah [name] [place] [facility] study/  no evidence ischemia/  normal wall motion, ef 72%  . Eye surgery Bilateral     cataracts  . Cystoscopy w/ ureteral stent placement Right 12/20/2014    Procedure: CYSTOSCOPY WITH RETROGRADE PYELOGRAM/URETERAL STENT PLACEMENT;  Surgeon: Hannah [name];  [facility];  Laterality: Right;  . Holmium laser application Right 12/21/72/8270 Procedure:  HOLMIUM LASER LITHOTRIPSY;  Surgeon: Hannah [name];  [facility];  Laterality:  Right;    FAMILY HISTORY: family history includes Diabetes in her maternal grandmother and son; Heart disease in her father and mother; Heart failure in her father.  SOCIAL HISTORY:  reports that she has never smoked. She has never used smokeless tobacco. She reports that she does not drink alcohol or use illicit  drugs.  ALLERGIES: Imdur; Amoxicillin; and Tetracycline  MEDICATIONS:  Current Outpatient Prescriptions  Medication Sig Dispense Refill  . ACCU-CHEK AVIVA PLUS test strip     . aspirin EC 81 MG EC tablet Take 1 tablet (81 mg total) by mouth daily.    . budesonide-formoterol (SYMBICORT) 160-4.5 MCG/ACT inhaler INHALE TWO PUFFS INTO LUNGS TWICE DAILY 1 Inhaler 11  . clopidogrel (PLAVIX) 75 MG tablet Take 75 mg by mouth daily.    Marland Kitchen HUMALOG 100 UNIT/ML injection Inject 76 units daily with  V-GO pump 70 mL 2  . loratadine (CLARITIN) 10 MG tablet Take 10 mg by mouth daily.    . metoprolol succinate (TOPROL-XL) 100 MG 24 hr tablet TAKE ONE TABLET BY MOUTH TWICE DAILY TAKE  WITH  OR  IMMEDIATELY  FOLLOWING  A  MEAL 180 tablet 2  . rosuvastatin (CRESTOR) 40 MG tablet TAKE ONE TABLET BY MOUTH ONCE DAILY 30 tablet 11  . ZETIA 10 MG tablet Take 1 tablet by mouth  daily 90 tablet 2   No current facility-administered medications for this encounter.    REVIEW OF SYSTEMS:  A 15 point review of systems is documented in the electronic medical record. This was obtained by the nursing staff. However, I reviewed this with the patient to discuss relevant findings and make appropriate changes.  Pertinent items are noted in HPI.   PHYSICAL EXAM:   Vitals with BMI 12/21/2015  Height _0   Weight 200 lbs 14 oz  BMI 59.7  Systolic 416  Diastolic 55  Pulse 66  Respirations 18   General: Alert and oriented, in no acute distress Neck: Neck is supple, no palpable cervical or supraclavicular lymphadenopathy. Heart: Regular in rate and rhythm with no murmurs, rubs, or gallops. Chest: Clear to auscultation bilaterally, with no rhonchi, wheezes, or rales. Patient also has a long vertical scar on her sternum from her prior CABG. Extremities: No cyanosis or edema. Lymphatics: see Neck Exam Skin: No concerning lesions. Breast: Examination of the left breast reveals no palpable mass or nipple discharge. Examination  of the right breast reveals extensive bruising in the upper outer quadrant. Steri-strips in place at the biopsy site. No dominant mass appreciated in the breast, nipple discharge, or bleeding.  Gynecologic History  Age at first menstrual period? 14  Are you still having periods? No Obstetric History:  How many children have you carried to term? 2 Your age at first live birth? 48  Pregnant now or trying to get pregnant? No  Have you used birth control pills or hormone shots for contraception? Yes  If so, for how long (or approximate dates)? [id number] Health Maintenance:  Have you ever had a colonoscopy? Yes If yes, date? 2016  Have you ever had a bone density? Yes If yes, date? June 2017  Date of your last PAP smear? 4 years ago Date of your FIRST mammogram? More than 15 years  ECOG = 1  LABORATORY DATA:  Lab Results  Component Value Date   WBC 5.6 12/21/2015   HGB 10.9* 12/21/2015   HCT 33.3* 12/21/2015   MCV 88.7 12/21/2015   PLT 151 12/21/2015   NEUTROABS 2.8 12/21/2015  Lab Results  Component Value Date   NA 142 12/21/2015   K 5.0 12/21/2015   CL 102 09/19/2015   CO2 28 12/21/2015   GLUCOSE 160* 12/21/2015   CREATININE 1.4* 12/21/2015   CALCIUM 9.2 12/21/2015      RADIOGRAPHY: Mm Digital Diagnostic Unilat R  12/14/2015  CLINICAL DATA:  Confirmation of clip placement after ultrasound-guided core needle biopsy of 2 adjacent masses in the upper inner right breast at the 12:30 o'clock position approximately 4 cm from the nipple. EXAM: DIAGNOSTIC RIGHT MAMMOGRAM POST ULTRASOUND BIOPSY COMPARISON:  Previous exam(s). FINDINGS: Mammographic images were obtained following ultrasound guided biopsy of a a dominant approximate 1.6 cm mass and an adjacent approximate 0.8 cm satellite nodule in the upper inner right breast. The ribbon shaped tissue marker clip is appropriately positioned within the dominant mass. The coil shaped tissue marker clip is appropriately positioned at the  posterior margin of the satellite nodule. Expected post biopsy changes are present without evidence of hematoma. IMPRESSION: Appropriate positioning of the ribbon shaped tissue marker clip within the dominant mass and appropriate positioning of the coil shaped tissue marker clip in the adjacent satellite nodule in the upper right breast. Final Assessment: Post Procedure Mammograms for Marker Placement Electronically Signed   By: Evangeline Dakin M.D.   On: 12/14/2015 14:30   [place] Rt Breast Bx W Loc Dev 1st Lesion Img Bx Spec US Guide  12/16/2015  ADDENDUM REPORT: 12/16/2015 07:56 ADDENDUM: Pathology revealed GRADE II INVASIVE MAMMARY CARCINOMA of the Right breast at the 12:30 o'clock location. GRADE II INVASIVE MAMMARY CARCINOMA of the Right breast at the 12:30 o'clock location, satellite. This was found to be concordant by Hannah. Peggye Fothergill. Pathology results were discussed with the patient by telephone. The patient reported doing well after the biopsies with tenderness and bruising at the sites. Post biopsy instructions and care were reviewed and questions were answered. The patient was encouraged to call [facility] [place] for any additional concerns. The patient was referred to [facility] on December 21, 2015. Pathology results reported by Terie Purser, RN on 12/16/2015. Electronically Signed   By: Evangeline Dakin M.D.   On: 12/16/2015 07:56  12/16/2015  CLINICAL DATA:  70 year old with screening detected suspicious approximate 1.6 cm mass in the inner quadrant of the right breast at the 12:30 o'clock position approximately 4 cm from the nipple with an adjacent satellite approximate 0.8 cm nodule. EXAM: ULTRASOUND GUIDED RIGHT BREAST CORE NEEDLE BIOPSY x 2 COMPARISON:  Previous exam(s). FINDINGS: I met with the patient and we discussed the procedure of ultrasound-guided biopsy, including benefits and alternatives. We  discussed the high likelihood of a successful procedure. We discussed the risks of the procedure, including infection, bleeding, tissue injury, clip migration, and inadequate sampling. Informed written consent was given. The usual time-out protocol was performed immediately prior to the procedure. Using sterile technique with chlorhexidine as skin antisepsis, buffered 1% Lidocaine as local anesthetic, under direct ultrasound visualization, a 12 gauge Bard Marquee core needle device was used to perform biopsy of the dominant approximate 1.6 cm mass at the 12:30 o'clock position approximately 4 cm from the nipple using an inferior approach. At the conclusion of the procedure a ribbon shaped tissue marker clip was deployed into the biopsy cavity. Using the same dermatotomy site, under direct ultrasound visualization, a 12 gauge Bard Marquee core needle device was used to perform biopsy of the satellite  0.8 cm nodule at the 12:30 o'clock position approximately 4 cm from the nipple using an inferior approach. At the conclusion of the procedure a coil shaped tissue marker clip was deployed into the biopsy cavity. Follow up 2 view mammogram was performed and dictated separately. IMPRESSION: Ultrasound guided biopsy of a dominant approximate 1.6 cm mass and an adjacent approximate 0.8 cm satellite nodule in the upper inner quadrant of the right breast. No apparent complications. Electronically Signed: By: Evangeline Dakin M.D. On: 12/14/2015 14:41   [place] Rt Breast Bx W Loc Dev Ea Add Lesion Img Bx Spec US Guide  12/16/2015  ADDENDUM REPORT: 12/16/2015 07:56 ADDENDUM: Pathology revealed GRADE II INVASIVE MAMMARY CARCINOMA of the Right breast at the 12:30 o'clock location. GRADE II INVASIVE MAMMARY CARCINOMA of the Right breast at the 12:30 o'clock location, satellite. This was found to be concordant by Hannah. Peggye Fothergill. Pathology results were discussed with the patient by telephone. The patient reported doing well after  the biopsies with tenderness and bruising at the sites. Post biopsy instructions and care were reviewed and questions were answered. The patient was encouraged to call [facility] [place] for any additional concerns. The patient was referred to [facility] on December 21, 2015. Pathology results reported by Terie Purser, RN on 12/16/2015. Electronically Signed   By: Evangeline Dakin M.D.   On: 12/16/2015 07:56  12/16/2015  CLINICAL DATA:  70 year old with screening detected suspicious approximate 1.6 cm mass in the inner quadrant of the right breast at the 12:30 o'clock position approximately 4 cm from the nipple with an adjacent satellite approximate 0.8 cm nodule. EXAM: ULTRASOUND GUIDED RIGHT BREAST CORE NEEDLE BIOPSY x 2 COMPARISON:  Previous exam(s). FINDINGS: I met with the patient and we discussed the procedure of ultrasound-guided biopsy, including benefits and alternatives. We discussed the high likelihood of a successful procedure. We discussed the risks of the procedure, including infection, bleeding, tissue injury, clip migration, and inadequate sampling. Informed written consent was given. The usual time-out protocol was performed immediately prior to the procedure. Using sterile technique with chlorhexidine as skin antisepsis, buffered 1% Lidocaine as local anesthetic, under direct ultrasound visualization, a 12 gauge Bard Marquee core needle device was used to perform biopsy of the dominant approximate 1.6 cm mass at the 12:30 o'clock position approximately 4 cm from the nipple using an inferior approach. At the conclusion of the procedure a ribbon shaped tissue marker clip was deployed into the biopsy cavity. Using the same dermatotomy site, under direct ultrasound visualization, a 12 gauge Bard Marquee core needle device was used to perform biopsy of the satellite 0.8 cm nodule at the 12:30 o'clock position  approximately 4 cm from the nipple using an inferior approach. At the conclusion of the procedure a coil shaped tissue marker clip was deployed into the biopsy cavity. Follow up 2 view mammogram was performed and dictated separately. IMPRESSION: Ultrasound guided biopsy of a dominant approximate 1.6 cm mass and an adjacent approximate 0.8 cm satellite nodule in the upper inner quadrant of the right breast. No apparent complications. Electronically Signed: By: Evangeline Dakin M.D. On: 12/14/2015 14:41    IMPRESSION: Hannah [name] is a 70 yo woman with invasive mammary carcinoma of the right female breast. She is a good candidate for a right lumpectomy and sentinel node biopsy, systemic chemotherapy based on final pathology from the biopsy, external radiation, and aromatase inhibitor.  The patient has  2 lesions within the breast that are close to each other. The patient would appear to be a candidate for a lumpectomy incorporating both of the areas.   PLAN: The patient will proceed with breast conservation surgery. Likely followed by adjuvant chemotherapy. The patient will be seen in post-operative period for further discussion of radiation therapy.   ------------------------------------------------  Blair Promise, PhD, [name]    This document serves as a record of services personally performed by Gery Pray, [name]. It was created on his behalf by Lendon Collar, a trained medical scribe. The creation of this record is based on the scribe's personal observations and the provider's statements to them. This document has been checked and approved by the attending provider.

## 2015-12-21 NOTE — Assessment & Plan Note (Signed)
Right breast biopsy 12:30 position 12/14/2015: 2 masses, 1.6 cm mass: Invasive ductal carcinoma, grade 2, ER 0%, PR 0%, HER-2 negative, Ki-67 70%; satellite mass 8 mm: IDC grade 2 ER 5%, PR 5%, HER-2 negative, Ki-67 50%; T1cN0 stage IA clinical stage  Pathology and radiology counseling: Discussed with the patient, the details of pathology including the type of breast cancer,the clinical staging, the significance of ER, PR and HER-2/neu receptors and the implications for treatment. After reviewing the pathology in detail, we proceeded to discuss the different treatment options between surgery, radiation, chemotherapy, antiestrogen therapies.  Recommendation based on multidisciplinary tumor board: 1. Breast conserving surgery with sentinel lymph node study 2. Adjuvant chemotherapy with Taxotere and Cytoxan (4 cycles if she has small tumor or 6 cycles if she has a large tumor). Adriamycin is not being given because of her cardiac problems 3. Followed by adjuvant radiation 4 followed by adjuvant antiestrogen therapy.   Chemotherapy Counseling: I discussed the risks and benefits of chemotherapy including the risks of nausea/ vomiting, risk of infection from low WBC count, fatigue due to chemo or anemia, bruising or bleeding due to low platelets, mouth sores, loss/ change in taste and decreased appetite. Liver and kidney function will be monitored through out chemotherapy as abnormalities in liver and kidney function may be a side effect of treatment. Risk of permanent bone marrow dysfunction and leukemia due to chemo were also discussed.   Return to clinic after surgery to discuss a final treatment plan

## 2015-12-21 NOTE — Progress Notes (Signed)
Clinical Social Work [facility] Psychosocial Distress Screening [facility]  Patient completed distress screening protocol and scored a 7 on the Psychosocial Distress Thermometer which indicates moderate distress. Clinical Social Worker met with patient and patients husband in [facility] to assess for distress and other psychosocial needs. Patient stated she was feeling overwhelmed but felt "better" after meeting with the treatment team and getting more information on her treatment plan. CSW and patient discussed common feeling and emotions when being diagnosed with cancer, and the importance of support during treatment. CSW informed patient of the support team and support services at [facility] and patient was agreeable to an Alight Guide. CSW provided contact information and encouraged patient to call with any questions or concerns.    ONCBCN DISTRESS SCREENING 12/21/2015  Screening Type Initial Screening  Distress experienced in past week (1-10) 7  Family Problem type Partner;Children  Emotional problem type Nervousness/Anxiety  Information Concerns Type Lack of info about diagnosis;Lack of info about treatment  Referral to clinical social work Yes  Referral to support programs Yes   Abigail Elmore, MSW, LCSW, OSW-C Clinical Social Worker [facility] [phone]       

## 2015-12-21 NOTE — Progress Notes (Signed)
Subjective:     Patient ID: Hannah [name], female   DOB: 03-Apr-1946, 70 y.o.   MRN: [id number]  HPI   Review of Systems     Objective:   Physical Exam For the patient to understand and be given the tools to implement a healthy plant based diet during their cancer diagnosis.     Assessment:     Patient was seen today and found to be jovial and accompanied by her pleasant husband. Pts labs: glucose 160, creatinine 1.4, total protein 6.2, GFR 39, HGB 10.9, HCT 33.3, A1C 7.8. Pts medical hx: CHF, CKD stage II. Pts medications: crestor, plavix, humalog. Pts ht 65 in, 200 lbs, BMI 33.5. Pt had great very specific questions some specifically relating to her diabetes.      Plan:     Dietitian educated the patient on implementing a plant based diet by incorporating more plant proteins, fruits, and vegetables. As a part of a healthy routine physical activity was discussed. A folder of evidence based information with a focus on a plant based diet and general nutrition during cancer was given to the patient.  The importance of legitimate, evidence based information was discussed and examples were given. As a part of the continuum of care the cancer dietitian's contact information was given to the patient in the event they would like to have a follow up appointment.

## 2015-12-21 NOTE — Telephone Encounter (Signed)
I have not received surgical clearance form from Dr Lucia Gaskins. I called Dr Pollie Friar office, spoke with Abigail Butts. Abigail Butts states she is going to send a message to Amy at Dr Pollie Friar office to let her know Dr Aundra Dubin has not received surgical clearance request.

## 2015-12-21 NOTE — Telephone Encounter (Signed)
New message   Patient calling just at cancer center just diagnose with breast cancer.     Clearance request will be sending over from Dr. Alphonsa Overall

## 2015-12-21 NOTE — Progress Notes (Signed)
[name] NOTE  Patient Care Team: Nicola Girt, DO as PCP - General Alphonsa Overall, MD as Consulting Physician (General Surgery) Nicholas Lose, MD as Consulting Physician (Hematology and Oncology) Gery Pray, MD as Consulting Physician (Radiation Oncology)  CHIEF COMPLAINTS/PURPOSE OF CONSULTATION:  Newly diagnosed breast cancer  HISTORY OF PRESENTING ILLNESS:  Hannah [name] 70 y.o. female is here because of recent diagnosis of right breast cancer. Patient had a screening mammogram that revealed abnormalities in the right breast. She underwent diagnostic mammograms at revealed 2 suspicious masses. There are both very close together at 12:00 position 1 was 1.6 cm and the satellite nodule was 8 mm. Both of these were biopsied. 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 The smaller satellite nodule is ER 5%, PR 2%, Ki-67 50% and HER-2 negative. She was presented this morning at the multidisciplinary tumor board and she is here today to discuss a treatment plan accompanied by her husband.  I reviewed her records extensively and collaborated the history with the patient.  SUMMARY OF ONCOLOGIC HISTORY:   Breast cancer of upper-inner quadrant of right female breast ([place])   12/14/2015 Initial Diagnosis Right breast biopsy 12:30 position: 2 masses, 1.6 cm mass: Invasive ductal carcinoma, grade 2, ER 0%, PR 0%, HER-2 negative, Ki-67 70%; satellite mass 8 mm: IDC grade 2 ER 5%, PR 5%, HER-2 negative, Ki-67 50%; T1cN0 stage IA clinical stage    MEDICAL HISTORY:  Past Medical History  Diagnosis Date  . Hypertension   . Hyperlipidemia   . CHF (congestive heart failure) (HCC)     EF is low normal at 50 to 55% per echo in Jan. 2011  . Renal insufficiency, mild   . Nonischemic cardiomyopathy ([place])   . Anemia   . Iron deficiency anemia   . S/P AVR     prosthesis valve placement 2009 at same time re-do CABG  . Hearing  loss of right ear   . PAF (paroxysmal atrial fibrillation) ([place])   . Bilateral carotid artery stenosis     Bilateral ICA 40-59%/  >50% LECA  . Dyspnea   . CAD (coronary artery disease) cardiologist-  dr Aundra Dubin    Remote CABG in 1998 with redo in 2009  . Type 2 diabetes mellitus ([place])     monitored by dr Dwyane Dee  . Renal calculus, right   . History of non-ST elevation myocardial infarction (NSTEMI)     Sept 2014--  thought to be type II HTN w/ LHC without infarct related artery and patent grafts  . PONV (postoperative nausea and vomiting)     pt states patch worked well before  . Stroke [facility])     residual rt hearing loss  . Moderate persistent asthma     pulmologist-  Dr. Malvin Johns  . GERD (gastroesophageal reflux disease)   . Nausea & vomiting     related to stone  . Abnormally small mouth   . Bruises easily   . Psoriasis     right leg  . Breast cancer of upper-inner quadrant of right female breast ([place]) 12/16/2015  . Breast cancer [facility])     SURGICAL HISTORY: Past Surgical History  Procedure Laterality Date  . [facility]ortic [facility]  2009    [facility]  . Left heart catheterization with coronary/graft angiogram N/A 02/23/2013    Procedure: LEFT HEART CATHETERIZATION WITH CORONARY/GRAFT ANGIOGRAM;  Surgeon: Blane Ohara, MD;  Location: [facility] C[facility] LAB;  Service: Cardiovascular;  Laterality: N/A;  . Coronary artery bypass graft  1998 &  re-do 2009    Had LIMA to DX/LAD, SVG to 2 marginal branches and SVG to [facility] originally; SVG to 3rd OM and PD at time of redo  . Cardiac catheterization  03/23/2008    Pre-redo CABG: L main OK, LAD (T), CFX (T), OM1 99%, RCA (T), LIMA-LAD OK, SVG-OM(?3) OK w/ little florw to OM2, SVG-RCA OK. EF NL  . Tubal ligation    . Tonsillectomy    . Carpal tunnel release    . Transthoracic echocardiogram  02-24-2013       mild LVH,  ef 50-55%/  AV bioprosthesis was present with very mild stenosis and no  regurg., mean grandient 30mHg, peak grandient 273mg /  mild MR/  mild LAE and RAE/  moderate TR  . Cardiovascular stress test  06-15-2013      dr hiDebara Pickett  [place] [facility]erfusion study/  no evidence ischemia/  normal wall motion, ef 72%  . Eye surgery Bilateral     cataracts  . Cystoscopy w/ ureteral stent placement Right 12/20/2014    Procedure: CYSTOSCOPY WITH RETROGRADE PYELOGRAM/URETERAL STENT PLACEMENT;  Surgeon: PaCleon GustinMD;  [facility];  Laterality: Right;  . Holmium laser application Right 12/16/59/9509  Procedure:  HOLMIUM LASER LITHOTRIPSY;  Surgeon: PaCleon GustinMD;  [facility]: [facility];  Laterality: Right;    SOCIAL HISTORY: Social History   Social History  . Marital Status: Married    Spouse Name: N/A  . Number of Children: N/A  . Years of Education: N/A   Occupational History  . Not on file.   Social History Main Topics  . Smoking status: Never Smoker   . Smokeless tobacco: Never Used  . Alcohol Use: No  . Drug Use: No  . Sexual Activity: Yes    Birth Control/ Protection: None   Other Topics Concern  . Not on file   Social History Narrative    FAMILY HISTORY: Family History  Problem Relation Age of Onset  . Heart disease Mother   . Heart disease Father   . Heart failure Father   . Diabetes Son   . Diabetes Maternal Grandmother     ALLERGIES:  is allergic to imdur; amoxicillin; and tetracycline.  MEDICATIONS:  Current Outpatient Prescriptions  Medication Sig Dispense Refill  . ACCU-CHEK AVIVA PLUS test strip     . aspirin EC 81 MG EC tablet Take 1 tablet (81 mg total) by mouth daily.    . budesonide-formoterol (SYMBICORT) 160-4.5 MCG/ACT inhaler INHALE TWO PUFFS INTO LUNGS TWICE DAILY 1 Inhaler 11  . clopidogrel (PLAVIX) 75 MG tablet Take 75 mg by mouth daily.    . Marland KitchenUMALOG 100 UNIT/ML injection Inject 76 units daily with  V-GO pump 70 mL 2  . loratadine  (CLARITIN) 10 MG tablet Take 10 mg by mouth daily.    . metoprolol succinate (TOPROL-XL) 100 MG 24 hr tablet TAKE ONE TABLET BY MOUTH TWICE DAILY TAKE  WITH  OR  IMMEDIATELY  FOLLOWING  A  MEAL 180 tablet 2  . rosuvastatin (CRESTOR) 40 MG tablet TAKE ONE TABLET BY MOUTH ONCE DAILY 30 tablet 11  . ZETIA 10 MG tablet Take 1 tablet by mouth  daily 90 tablet 2   No current facility-administered medications for this visit.  REVIEW OF SYSTEMS:   Constitutional: Denies fevers, chills or abnormal night sweats Eyes: Denies blurriness of vision, double vision or watery eyes Ears, nose, mouth, throat, and face: Denies mucositis or sore throat Respiratory: Denies cough, dyspnea or wheezes Cardiovascular: Denies palpitation, chest discomfort or lower extremity swelling Gastrointestinal:  Denies nausea, heartburn or change in bowel habits Skin: Denies abnormal skin rashes Lymphatics: Denies new lymphadenopathy or easy bruising Neurological:Denies numbness, tingling or new weaknesses Behavioral/Psych: Mood is stable, no new changes  Breast:  Denies any palpable lumps or discharge All other systems were reviewed with the patient and are negative.  PHYSICAL EXAMINATION: ECOG PERFORMANCE STATUS: 1 - Symptomatic but completely ambulatory  Filed Vitals:   12/21/15 0837  BP: 127/55  Pulse: 66  Temp: 98.3 F (36.8 C)  Resp: 18   Filed Weights   12/21/15 0837  Weight: 200 lb 13.6 oz (91.105 kg)    GENERAL:alert, no distress and comfortable SKIN: skin color, texture, turgor are normal, no rashes or significant lesions EYES: normal, conjunctiva are pink and non-injected, sclera clear OROPHARYNX:no exudate, no erythema and lips, buccal mucosa, and tongue normal  NECK: supple, thyroid normal size, non-tender, without nodularity LYMPH:  no palpable lymphadenopathy in the cervical, axillary or inguinal LUNGS: clear to auscultation and percussion with normal breathing effort HEART: regular rate &  rhythm and no murmurs and no lower extremity edema ABDOMEN:abdomen soft, non-tender and normal bowel sounds Musculoskeletal:no cyanosis of digits and no clubbing  PSYCH: alert & oriented x 3 with fluent speech NEURO: no focal motor/sensory deficits BREASTBruising related recent surgeries. No palpable axillary or supraclavicular lymphadenopathy (exam performed in the presence of a chaperone)   LABORATORY DATA:  I have reviewed the data as listed Lab Results  Component Value Date   WBC 5.6 12/21/2015   HGB 10.9* 12/21/2015   HCT 33.3* 12/21/2015   MCV 88.7 12/21/2015   PLT 151 12/21/2015   Lab Results  Component Value Date   NA 142 12/21/2015   K 5.0 12/21/2015   CL 102 09/19/2015   CO2 28 12/21/2015    RADIOGRAPHIC STUDIES: I have personally reviewed the radiological reports and agreed with the findings in the report.  ASSESSMENT AND PLAN:  Breast cancer of upper-inner quadrant of right female breast ([place]) Right breast biopsy 12:30 position 12/14/2015: 2 masses, 1.6 cm mass: Invasive ductal carcinoma, grade 2, ER 0%, PR 0%, HER-2 negative, Ki-67 70%; satellite mass 8 mm: IDC grade 2 ER 5%, PR 5%, HER-2 negative, Ki-67 50%; T1cN0 stage IA clinical stage  Pathology and radiology counseling: Discussed with the patient, the details of pathology including the type of breast cancer,the clinical staging, the significance of ER, PR and HER-2/neu receptors and the implications for treatment. After reviewing the pathology in detail, we proceeded to discuss the different treatment options between surgery, radiation, chemotherapy, antiestrogen therapies.  Recommendation based on multidisciplinary tumor board: 1. Breast conserving surgery with sentinel lymph node study 2. Adjuvant chemotherapy with Taxotere and Cytoxan (4 cycles if she has small tumor or 6 cycles if she has a large tumor). Adriamycin is not being given because of her cardiac problems 3. Followed by adjuvant radiation 4  followed by adjuvant antiestrogen therapy.   Chemotherapy Counseling: I discussed the risks and benefits of chemotherapy including the risks of nausea/ vomiting, risk of infection from low WBC count, fatigue due to chemo or anemia, bruising or bleeding due to low platelets, mouth sores, loss/ change in taste and decreased appetite. Liver and kidney  function will be monitored through out chemotherapy as abnormalities in liver and kidney function may be a side effect of treatment. Risk of permanent bone marrow dysfunction and leukemia due to chemo were also discussed.   Return to clinic after surgery to discuss a final treatment plan  All questions were answered. The patient knows to call the clinic with any problems, questions or concerns.    Rulon Eisenmenger, MD 12/21/2015

## 2015-12-21 NOTE — Telephone Encounter (Signed)
Hannah [name] is aware after today Dr Aundra Dubin will not be back in the office until 12/28/15

## 2015-12-21 NOTE — Telephone Encounter (Signed)
Pt advised Dr Aundra Dubin has received and completed surgical clearance for surgery by Dr Lucia Gaskins, Dr Aundra Dubin has noted on clearance pt may hold Plavix for 7 days prior to surgery. This has placed in Medical Records to be faxed to Dr Lucia Gaskins.

## 2015-12-21 NOTE — Telephone Encounter (Signed)
Pt states she has recently been diagnosed with breast CA. Dr Alphonsa Overall will be doing surgery and is sending a surgical clearance request for Dr Aundra Dubin to complete. Pt advised I will notify her once Dr Aundra Dubin has received and reviewed surgical clearance request. Pt states she has not had any new cardiac symptoms since office visit with Dr Aundra Dubin April 2017.

## 2015-12-26 ENCOUNTER — Other Ambulatory Visit: Payer: Self-pay | Admitting: Surgery

## 2015-12-26 DIAGNOSIS — C50911 Malignant neoplasm of unspecified site of right female breast: Secondary | ICD-10-CM

## 2015-12-27 ENCOUNTER — Telehealth: Payer: Self-pay | Admitting: *Deleted

## 2015-12-27 NOTE — Telephone Encounter (Signed)
Spoke to pt concerning [place] from 12/21/15. Denies questions or concerns regarding dx or treatment care plan Encourage pt to call with needs. Received verbal understanding.

## 2015-12-30 ENCOUNTER — Other Ambulatory Visit: Payer: Self-pay | Admitting: Surgery

## 2015-12-30 DIAGNOSIS — C50911 Malignant neoplasm of unspecified site of right female breast: Secondary | ICD-10-CM

## 2016-01-02 ENCOUNTER — Encounter: Payer: Self-pay | Admitting: *Deleted

## 2016-01-03 ENCOUNTER — Telehealth: Payer: Self-pay | Admitting: Hematology and Oncology

## 2016-01-03 NOTE — Telephone Encounter (Signed)
Spoke with pt to confirm 8/17 appts date/times per pof

## 2016-01-05 ENCOUNTER — Ambulatory Visit (INDEPENDENT_AMBULATORY_CARE_PROVIDER_SITE_OTHER): Payer: Medicare Other | Admitting: Endocrinology

## 2016-01-05 ENCOUNTER — Encounter: Payer: Self-pay | Admitting: Endocrinology

## 2016-01-05 VITALS — BP 130/62 | HR 69 | Wt 202.0 lb

## 2016-01-05 DIAGNOSIS — Z794 Long term (current) use of insulin: Secondary | ICD-10-CM | POA: Diagnosis not present

## 2016-01-05 DIAGNOSIS — E1165 Type 2 diabetes mellitus with hyperglycemia: Secondary | ICD-10-CM | POA: Diagnosis not present

## 2016-01-05 LAB — POCT GLYCOSYLATED HEMOGLOBIN (HGB A1C): Hemoglobin A1C: 6.5

## 2016-01-05 NOTE — Progress Notes (Signed)
Patient ID: Hannah [name], female   DOB: December 22, 1945, 70 y.o.   MRN: [id number]           Reason for Appointment: Follow-up for Type 2 Diabetes  Referring physician: Suzy Bouchard  History of Present Illness:          Date of diagnosis of type 2 diabetes mellitus: Late 1980s        Background history:  She was previously treated with metformin prior to starting insulin She thinks she has been on insulin for at least 10 years and mostly taking Lantus and other types of insulin with it Her metformin was stopped when she started insulin and also has had renal dysfunction  Recent history:   INSULIN regimen is described as:  V-go 40 basal, mealtime coverage 8 u ac B and 8-10 acL, 12-14 acS  Previously her A1c had been consistently over 8% and occasionally higher. Because of poor control she was switched from Januvia to [place] on her initial consultation in 5/16 Subsequently she has been on Trulicity A999333 mg weekly because of nausea with Victoza She has been on the V-go pump since 03/2015  Most recent A1c is down to 6.5, previously 7.8  Current blood sugar patterns and problems identified:   She was having problems with low blood sugars a couple of weeks ago but she thinks this is less after discussion with dietitian and modifying her diet with low protein and other foods like beings  More recently her blood sugars look excellent  She is fairly consistent with trying to check her sugars at various times of the day  She does have sporadic high readings at night after supper and some of these may be related to forgetting to bolus at mealtimes  She has had 1 recent hypoglycemia early morning which was related to her taking her mealtime on this late at night when glucose was 303 after supper  FASTING blood sugars are fairly consistently Normal  She is asking about how much to bolus of her blood sugars are below 100 or so  Blood sugars are now fairly consistent throughout the  day  Average blood sugars are between 92 and 153 at any given time of the day, highest after supper  Currently getting her insulin pumps from a [place]     Oral hypoglycemic drugs the patient is taking are: None Side effects from medications have been: None  Compliance with the medical regimen: Fair  Glucose monitoring:  done about 3 times a day         Glucometer:  Accu-Chek Aviva Blood Glucose readings by download for the last 4 weeks:  Mean values apply above for all meters except median for One Touch  PRE-MEAL Fasting Lunch Dinner Bedtime Overall  Glucose range: 77-194   68 -191  120-303    Mean/median: 105  125  105  155  120    Self-care: The diet that the patient has been following is: tries to limit fats and carbs.  Eating out periodically She uses diet green tea and no sugar in drinks   Lunch 1-2 pm Dinner 6-7 PM; Hs snack  Typical meal intake: Breakfast is eggs usually             Dietician visit, most recent: 5/16And in 7/16 with oncology dietitian                Exercise: Water exercises, Irregular recently  Weight history:  Wt Readings from Last 3 Encounters:  01/05/16 202 lb (91.6 kg)  12/21/15 200 lb 13.6 oz (91.1 kg)  10/19/15 200 lb 3.2 oz (90.8 kg)    Glycemic control:      Lab Results  Component Value Date   HGBA1C 6.5 01/05/2016   HGBA1C 7.8 (H) 09/19/2015   HGBA1C 7.2 06/23/2015   Lab Results  Component Value Date   MICROALBUR 24.3 (H) 09/19/2015   LDLCALC 58 07/05/2015   CREATININE 1.4 (H) 12/21/2015         Medication List       Accurate as of 01/05/16 12:29 PM. Always use your most recent med list.          ACCU-CHEK AVIVA PLUS test strip Generic drug:  glucose blood   alendronate 70 MG tablet Commonly known as:  FOSAMAX Take 70 mg by mouth once a week. Take with a full glass of water on an empty stomach.   aspirin 81 MG EC tablet Take 1 tablet (81 mg total) by mouth daily.   budesonide-formoterol 160-4.5  MCG/ACT inhaler Commonly known as:  SYMBICORT INHALE TWO PUFFS INTO LUNGS TWICE DAILY   clopidogrel 75 MG tablet Commonly known as:  PLAVIX Take 75 mg by mouth daily.   HUMALOG 100 UNIT/ML injection Generic drug:  insulin lispro Inject 76 units daily with  V-GO pump   loratadine 10 MG tablet Commonly known as:  CLARITIN Take 10 mg by mouth daily.   metoprolol succinate 100 MG 24 hr tablet Commonly known as:  TOPROL-XL TAKE ONE TABLET BY MOUTH TWICE DAILY TAKE  WITH  OR  IMMEDIATELY  FOLLOWING  A  MEAL   rosuvastatin 40 MG tablet Commonly known as:  CRESTOR TAKE ONE TABLET BY MOUTH ONCE DAILY   TraMADol HCl A999333 MG 123456   TRULICITY A999333 0000000 Sopn Generic drug:  Dulaglutide   ZETIA 10 MG tablet Generic drug:  ezetimibe Take 1 tablet by mouth  daily       Allergies:  Allergies  Allergen Reactions  . Imdur [Isosorbide Dinitrate] Other (See Comments)    Headache/severe hypotension/Syncope  . Amoxicillin Rash  . Tetracycline Rash    Past Medical History:  Diagnosis Date  . Abnormally small mouth   . Anemia   . Bilateral carotid artery stenosis    Bilateral ICA 40-59%/  >50% LECA  . Breast cancer ([place])   . Breast cancer of upper-inner quadrant of right female breast ([place]) 12/16/2015  . Bruises easily   . CAD (coronary artery disease) cardiologist-  dr Aundra Dubin   Remote CABG in 1998 with redo in 2009  . CHF (congestive heart failure) (HCC)    EF is low normal at 50 to 55% per echo in Jan. 2011  . Dyspnea   . GERD (gastroesophageal reflux disease)   . Hearing loss of right ear   . History of non-ST elevation myocardial infarction (NSTEMI)    Sept 2014--  thought to be type II HTN w/ LHC without infarct related artery and patent grafts  . Hyperlipidemia   . Hypertension   . Iron deficiency anemia   . Moderate persistent asthma    pulmologist-  Dr. Malvin Johns  . Nausea & vomiting    related to stone  . Nonischemic cardiomyopathy ([place])   . PAF (paroxysmal atrial  fibrillation) ([place])   . PONV (postoperative nausea and vomiting)    pt states patch worked well before  . Psoriasis    right leg  . Renal calculus, right   . Renal insufficiency, mild   .  S/P AVR    prosthesis valve placement 2009 at same time re-do CABG  . Stroke [facility])    residual rt hearing loss  . Type 2 diabetes mellitus ([place])    monitored by dr Dwyane Dee    Past Surgical History:  Procedure Laterality Date  . AORTIC VALVE REPLACEMENT  2009   #18mm [facility] Ease pericardial valve (done same time is CABG)  . CARDIAC CATHETERIZATION  03/23/2008   Pre-redo CABG: L main OK, LAD (T), CFX (T), OM1 99%, RCA (T), LIMA-LAD OK, SVG-OM(?3) OK w/ little florw to OM2, SVG-RCA OK. EF NL  . CARDIOVASCULAR STRESS TEST  06-15-2013      dr Debara Pickett   [place] [facility] perfusion study/  no evidence ischemia/  normal wall motion, ef 72%  . CARPAL TUNNEL RELEASE    . CORONARY ARTERY BYPASS GRAFT  1998 &  re-do 2009   Had LIMA to DX/LAD, SVG to 2 marginal branches and SVG to [facility] originally; SVG to 3rd OM and PD at time of redo  . CYSTOSCOPY W/ URETERAL STENT PLACEMENT Right 12/20/2014   Procedure: CYSTOSCOPY WITH RETROGRADE PYELOGRAM/URETERAL STENT PLACEMENT;  Surgeon: Cleon Gustin, MD;  Location: [facility];  Service: Urology;  Laterality: Right;  . EYE SURGERY Bilateral    cataracts  . HOLMIUM LASER APPLICATION Right [id number]   Procedure:  HOLMIUM LASER LITHOTRIPSY;  Surgeon: Cleon Gustin, MD;  [facility];  Service: Urology;  Laterality: Right;  . LEFT HEART CATHETERIZATION WITH CORONARY/GRAFT ANGIOGRAM N/A 02/23/2013   Procedure: LEFT HEART CATHETERIZATION WITH Beatrix Fetters;  Surgeon: Blane Ohara, MD;  [facility];  Service: Cardiovascular;  Laterality: N/A;  . TONSILLECTOMY    . TRANSTHORACIC ECHOCARDIOGRAM  02-24-2013      mild LVH,  ef 50-55%/  AV bioprosthesis was present with very mild stenosis and no regurg., mean  grandient 60mmHg, peak grandient 34mmHg /  mild MR/  mild LAE and RAE/  moderate TR  . TUBAL LIGATION      Family History  Problem Relation Age of Onset  . Heart disease Mother   . Heart disease Father   . Heart failure Father   . Diabetes Son   . Diabetes Maternal Grandmother     Social History:  reports that she has never smoked. She has never used smokeless tobacco. She reports that she does not drink alcohol or use drugs.    Review of Systems    Lipid history: On 40 mg Crestor along with Zetia since she has had her coronary artery disease     Lab Results  Component Value Date   CHOL 116 (L) 07/05/2015   HDL 38 (L) 07/05/2015   LDLCALC 58 07/05/2015   LDLDIRECT 109.2 03/01/2014   TRIG 99 07/05/2015   CHOLHDL 3.1 07/05/2015           Eyes:   Most recent eye exam wasIn 2016 and was not told to have any retinopathy  Hypertension: Blood pressure is well controlled now without any specific medications except metoprolol and Lasix   Diabetic foot exam in 5/16 shows She has complete loss of monofilament sensation in her distal feet with only mild sensitivity present on the right first and second toes  Physical Examination:  BP 130/62 (BP Location: Left Arm, Patient Position: Sitting)   Pulse 69   Wt 202 lb (91.6 kg)   SpO2 99%   BMI 33.61 kg/m    ASSESSMENT:  Diabetes type 2,  uncontrolled   See history of present illness for detailed discussion of  current management, blood sugar patterns and problems identified Her blood sugar download was reviewed and blood sugar patterns were discussed with the patient She is doing fairly well now with her A1c down below 7% after a long time She is fairly compliant with using her pump but only occasionally will forget to bolus at suppertime Most of her blood sugars are excellent now Has less hypoglycemia after trying to modify her diet and include protein Has not gained any weight This is despite her not being able to  exercise recently  She also is benefiting from continuing Trulicity A999333 mg which helps her watch her diet    PLAN:   No change in boluses  She may reduce her boluses by one click if blood sugar is low normal  If she forgets her bolus at a meal she should not take the full dose later on postprandially, no more than 6 units  Restart exercise when able to  She will switch to the 30 units pump if again getting overnight or suppertime hypoglycemia, to call if she is having difficulty with blood sugar control  She can try taking Lantus the night before her breast surgery instead of using the pump which she is currently changing at bedtime   Patient Instructions  Reduce by 1 click if sugar 80- or below  Late nite no more than 3 clicks  35 Lantus nite before surgery      Counseling time on subjects discussed above is over 50% of today's 25 minute visit   Hannah [name] 01/05/2016, 12:29 PM   Note: This office note was prepared with Dragon voice recognition system technology. Any transcriptional errors that result from this process are unintentional.

## 2016-01-05 NOTE — Patient Instructions (Addendum)
Reduce by 1 click if sugar 80- or below  Late nite no more than 3 clicks  35 Lantus nite before surgery

## 2016-01-06 DIAGNOSIS — H903 Sensorineural hearing loss, bilateral: Secondary | ICD-10-CM

## 2016-01-06 HISTORY — DX: Sensorineural hearing loss, bilateral: H90.3

## 2016-01-11 ENCOUNTER — Encounter (HOSPITAL_COMMUNITY): Payer: Self-pay

## 2016-01-11 ENCOUNTER — Encounter (HOSPITAL_COMMUNITY)
Admission: RE | Admit: 2016-01-11 | Discharge: 2016-01-11 | Disposition: A | Discharge status: Home / Self Care | Payer: Medicare Other | Source: Ambulatory Visit | Attending: Surgery | Admitting: Surgery

## 2016-01-11 DIAGNOSIS — C50911 Malignant neoplasm of unspecified site of right female breast: Secondary | ICD-10-CM | POA: Diagnosis not present

## 2016-01-11 DIAGNOSIS — Z01812 Encounter for preprocedural laboratory examination: Secondary | ICD-10-CM | POA: Diagnosis not present

## 2016-01-11 LAB — BASIC METABOLIC PANEL
Anion gap: 6 (ref 5–15)
BUN: 25 mg/dL — ABNORMAL HIGH (ref 6–20)
CO2: 27 mmol/L (ref 22–32)
Calcium: 9.8 mg/dL (ref 8.9–10.3)
Chloride: 107 mmol/L (ref 101–111)
Creatinine, Ser: 1.18 mg/dL — ABNORMAL HIGH (ref 0.44–1.00)
GFR calc Af Amer: 53 mL/min — ABNORMAL LOW (ref >60)
GFR calc non Af Amer: 46 mL/min — ABNORMAL LOW (ref >60)
Glucose, Bld: 148 mg/dL — ABNORMAL HIGH (ref 65–99)
Potassium: 4.2 mmol/L (ref 3.5–5.1)
Sodium: 140 mmol/L (ref 135–145)

## 2016-01-11 LAB — CBC
HCT: 36.5 % (ref 36.0–46.0)
Hemoglobin: 11.4 g/dL — ABNORMAL LOW (ref 12.0–15.0)
MCH: 28.1 pg (ref 26.0–34.0)
MCHC: 31.2 g/dL (ref 30.0–36.0)
MCV: 90.1 fL (ref 78.0–100.0)
Platelets: 183 10*3/uL (ref 150–400)
RBC: 4.05 MIL/uL (ref 3.87–5.11)
RDW: 12.9 % (ref 11.5–15.5)
WBC: 7.3 10*3/uL (ref 4.0–10.5)

## 2016-01-11 LAB — GLUCOSE, CAPILLARY: Glucose-Capillary: 185 mg/dL — ABNORMAL HIGH (ref 65–99)

## 2016-01-11 NOTE — Progress Notes (Signed)
Pt has CAD, A-fib and hx of CABG. Dr. Aundra Dubin is her cardiologist. Cardiac clearance noted in [place] on 12/21/15. Pt is diabetic. Last A1C was 6.5 on 01/05/16. Pt is on the V-Go pump, but Dr. Dwyane Dee has instructed her not to use the pump the night before surgery, but use 35 units of Lantus insulin that night.

## 2016-01-11 NOTE — Pre-Procedure Instructions (Addendum)
Sharifah Degon Necaise  01/11/2016     Your procedure is scheduled on Wednesday, January 18, 2016 at 8:30 AM.   Report to [facility] Entrance "A" Admitting Office at 6:30 AM.   Call this number if you have problems the morning of surgery: [phone]   Any questions prior to day of surgery, please call [phone] between 8 & 4 PM.   Remember:  Do not eat food or drink liquids after midnight Tuesday, 01/17/16.  Take these medicines the morning of surgery with A SIP OF WATER: Loratadine (Claritin), Metoprolol (Toprol XL), Tramadol, Symbicort inhaler.  Stop Aspirin and Plavix as instructed by physician.    How to Manage Your Diabetes Before Surgery   Why is it important to control my blood sugar before and after surgery?   Improving blood sugar levels before and after surgery helps healing and can limit problems.  A way of improving blood sugar control is eating a healthy diet by:  - Eating less sugar and carbohydrates  - Increasing activity/exercise  - Talk with your doctor about reaching your blood sugar goals  High blood sugars (greater than 180 mg/dL) can raise your risk of infections and slow down your recovery so you will need to focus on controlling your diabetes during the weeks before surgery.  Make sure that the doctor who takes care of your diabetes knows about your planned surgery including the date and location.  How do I manage my blood sugars before surgery?   Check your blood sugar at least 4 times a day, 2 days before surgery to make sure that they are not too high or low.  Check your blood sugar the morning of your surgery when you wake up and every 2 hours until you get to the Short-Stay unit.  Treat a low blood sugar (less than 70 mg/dL) with 1/2 cup of clear juice (cranberry or apple), 4 glucose tablets, OR glucose gel.  Recheck blood sugar in 15 minutes after treatment (to make sure it is greater than 70 mg/dL).  If blood sugar is not greater than 70  mg/dL on re-check, call [phone] for further instructions.   Report your blood sugar to the Short-Stay nurse when you get to Short-Stay.  References:  [facility], 2007 "How to Manage your Diabetes Before and After Surgery".  What do I do about my diabetes medications?   Do not take oral diabetes medicines (pills) the morning of surgery.  THE NIGHT BEFORE SURGERY, take 35 units of Lantus Insulin per Dr. Ronnie Derby instructions.   Do not wear jewelry, make-up or nail polish.  Do not wear lotions, powders, or perfumes.  You may  NOT  wear deodorant.  Do not shave 48 hours prior to surgery.    Do not bring valuables to the hospital.  [facility] is not responsible for any belongings or valuables.  Contacts, dentures or bridgework may not be worn into surgery.  Leave your suitcase in the car.  After surgery it may be brought to your room.  For patients admitted to the hospital, discharge time will be determined by your treatment team.  Patients discharged the day of surgery will not be allowed to drive home.   Special instructions:  [place] - Preparing for Surgery  Before surgery, you can play an important role.  Because skin is not sterile, your skin needs to be as free of germs as possible.  You can reduce the number of germs on  you skin by washing with CHG (chlorahexidine gluconate) soap before surgery.  CHG is an antiseptic cleaner which kills germs and bonds with the skin to continue killing germs even after washing.  Please DO NOT use if you have an allergy to CHG or antibacterial soaps.  If your skin becomes reddened/irritated stop using the CHG and inform your nurse when you arrive at Short Stay.  Do not shave (including legs and underarms) for at least 48 hours prior to the first CHG shower.  You may shave your face.  Please follow these instructions carefully:   1.  Shower with CHG Soap the night before surgery and the                                 morning of Surgery.  2.  If you choose to wash your hair, wash your hair first as usual with your       normal shampoo.  3.  After you shampoo, rinse your hair and body thoroughly to remove the shampoo.  4.  Use CHG as you would any other liquid soap.  You can apply chg directly       to the skin and wash gently with scrungie or a clean washcloth.  5.  Apply the CHG Soap to your body ONLY FROM THE NECK DOWN.        Do not use on open wounds or open sores.  Avoid contact with your eyes, ears, mouth and genitals (private parts).  Wash genitals (private parts) with your normal soap.  6.  Wash thoroughly, paying special attention to the area where your surgery        will be performed.  7.  Thoroughly rinse your body with warm water from the neck down.  8.  DO NOT shower/wash with your normal soap after using and rinsing off       the CHG Soap.  9.  Pat yourself dry with a clean towel.            10.  Wear clean pajamas.            11.  Place clean sheets on your bed the night of your first shower and do not        sleep with pets.  Day of Surgery  Do not apply any lotions/deodorants the morning of surgery.  Please wear clean clothes to the hospital.    Please read over the following fact sheets that you were given. Pain Booklet, Coughing and Deep Breathing and Surgical Site Infection Prevention

## 2016-01-16 ENCOUNTER — Ambulatory Visit
Admission: RE | Admit: 2016-01-16 | Discharge: 2016-01-16 | Disposition: A | Discharge status: Home / Self Care | Payer: Medicare Other | Source: Ambulatory Visit | Attending: Surgery | Admitting: Surgery

## 2016-01-16 DIAGNOSIS — C50911 Malignant neoplasm of unspecified site of right female breast: Secondary | ICD-10-CM

## 2016-01-17 MED ORDER — ACETAMINOPHEN 500 MG PO TABS
1000.0000 mg | ORAL_TABLET | ORAL | Status: AC
Start: 2016-01-18 — End: 2016-01-18
  Administered 2016-01-18: 1000 mg via ORAL
  Filled 2016-01-17: qty 2

## 2016-01-17 MED ORDER — CEFAZOLIN SODIUM-DEXTROSE 2-4 GM/100ML-% IV SOLN
2.0000 g | INTRAVENOUS | Status: AC
  Administered 2016-01-18: 2 g via INTRAVENOUS
  Filled 2016-01-17: qty 100

## 2016-01-17 MED ORDER — GABAPENTIN 300 MG PO CAPS
300.0000 mg | ORAL_CAPSULE | ORAL | Status: AC
  Administered 2016-01-18: 300 mg via ORAL
  Filled 2016-01-17: qty 1

## 2016-01-17 NOTE — Anesthesia Preprocedure Evaluation (Addendum)
Anesthesia Evaluation  Patient identified by MRN, date of birth, ID band Patient awake    Reviewed: Allergy & Precautions, H&P , NPO status , Patient's Chart, lab work & pertinent test results, reviewed documented beta blocker date and time   History of Anesthesia Complications (+) PONV  Airway Mallampati: III  TM Distance: >3 FB Neck ROM: Full    Dental no notable dental hx. (+) Teeth Intact, Dental Advisory Given   Pulmonary asthma ,    Pulmonary exam normal breath sounds clear to auscultation       Cardiovascular hypertension, Pt. on medications and Pt. on home beta blockers + CAD, + Past MI, + Peripheral Vascular Disease and +CHF   Rhythm:Regular Rate:Normal     Neuro/Psych CVA negative psych ROS   GI/Hepatic Neg liver ROS, GERD  Controlled,  Endo/Other  diabetes, Type 1, Insulin Dependent  Renal/GU Renal InsufficiencyRenal disease  negative genitourinary   Musculoskeletal   Abdominal   Peds  Hematology negative hematology ROS (+)   Anesthesia Other Findings   Reproductive/Obstetrics negative OB ROS                            Anesthesia Physical Anesthesia Plan  ASA: III  Anesthesia Plan: General and Regional   Post-op Pain Management: GA combined w/ Regional for post-op pain   Induction: Intravenous  Airway Management Planned: LMA  Additional Equipment:   Intra-op Plan:   Post-operative Plan: Extubation in OR  Informed Consent: I have reviewed the patients History and Physical, chart, labs and discussed the procedure including the risks, benefits and alternatives for the proposed anesthesia with the patient or authorized representative who has indicated his/her understanding and acceptance.   Dental advisory given  Plan Discussed with: CRNA  Anesthesia Plan Comments:         Anesthesia Quick Evaluation

## 2016-01-17 NOTE — H&P (Signed)
Hannah [name]  Location: [facility] Patient #: [id number] DOB: [birth year] Undefined / Language: Cleophus Molt / Race: White Female  History of Present Illness   The patient is a 70 year old female who presents with a complaint of Breast cancer.   Her PCP and referring physciain is Dr. Doug [name]  She is at the [facility] - Drs. Hannah [name] and Hannah [name]  She comes with her husband, Hannah [name].   She has had regular mammograms. She had a mammogram at [facility] that showed two lesions in the right breast at 12:30 - one was 1.6 cm and one was 0.8 cm. They are about 1.8 cm apart.  She underwent two biopsies of the right biopsy at 12:30 o'clock on 12/14/2015 ([id number]) - which showed 2 areas of IDC, grade 2, ER - 0%, PR - 0%, Ki-67 50 and 70%, Her2Neu - neg. On mammogram, one area is 1.6 cm and one area is 0.8 cm - they are 1.8 cm apart.  She has no family history of breast cancer. She has had no prior breast operations. She is not on hormones.  I discussed the options for breast cancer treatment with the patient. She is in the Breast multidisciplinary clinic, which includes medical oncology and radiation oncology. I discussed the surgical options of lumpectomy vs. mastectomy. If mastectomy, there is the possibility of reconstruction. I discussed the options of lymph node biopsy. The treatment plan depends on the pathologic staging of the tumor and the patient's personal wishes. The risks of surgery include, but are not limited to, bleeding, infection, the need for further surgery, and nerve injury. The patient has been given literature on the treatment of breast cancer.  Power port placement - I discussed the indications and potential complications of the power port placement. The primary complications of the power port, include, but are not limited to, bleeding, infection, nerve injury, thrombosis, and pneumothorax.  Plan: 1) Cardiac  clearance, 2) hold Plavix for 1 week prior to surgery, 3) Rgith breast lumpectomy (seed loc), right axillary sentinel lymph node biopsy, power port placement, 4) chemotx, 5) rad tx, 6) anti-hormone tx  Past Medical History: 1. CAD - sees Dr. Einar [name] Last saw him 09/29/2015 History of CABG - 1998, stent in 1999. ARV in 2009 There has been some questions of A fib vs SVT - she saw Dr. Lovena [name] on 03/17/2015  2. Has bioprosthetic AVR 3. On plavix She stopped the Plavix for her renal procedure last year and did fine 4. DM - sees Dr. Eduard [name] Most recent Hgb A1C - 7.8 5. Sees Hannah [name] for asthma Averages prednisone 3 to 4 times per year 6. Back issues She had MRI of her back on 11/15/2015 which showed scoliosis and other changes She sees Dr. Vertell [name] and has had injections in the past 7. Mini stroke in 2014. 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 colonoscopy by Dr. Cristina [name] in 2012 9. History of kidneys stones Dr. Alyson [name]  Social History:  She comes with her husband, Hannah [name]. She works part time for her church. She is retired from [employer] in 2008. She has two sons.    Other Problems Hannah Pummel, RN; 12/21/2015 7:45 AM) Arthritis Asthma Atrial Fibrillation Back Pain Cerebrovascular Accident Diabetes Mellitus Heart murmur Kidney Stone Lump In Breast  Past Surgical History Hannah Pummel, RN; 12/21/2015 7:45 AM) Breast Biopsy Right.  Diagnostic Studies History (  Hannah Pummel, RN; 12/21/2015 7:45 AM) Colonoscopy 1-5 years ago Mammogram within last year Pap Smear 1-5 years ago  Medication History Hannah Pummel, RN; 12/21/2015 7:46 AM) Medications Reconciled  Social History Hannah Pummel, RN; 12/21/2015 7:45 AM) Caffeine use Coffee. No alcohol use Tobacco use Never smoker.  Family History Hannah Pummel, RN; 12/21/2015 7:45 AM) Arthritis Family Members In General. Heart Disease Brother, Family Members In General, Father. Heart disease in female family member before age 30  Pregnancy / Birth History Hannah Pummel, RN; 12/21/2015 7:45 AM) Age at menarche 72 years. Age of menopause <45 Contraceptive History Oral contraceptives. Gravida 3 Maternal age 43-25 Para 2    Review of Systems Hannah Pummel RN; 12/21/2015 7:45 AM) General Not Present- Appetite Loss, Chills, Fatigue, Fever, Night Sweats, Weight Gain and Weight Loss. Skin Not Present- Change in Wart/Mole, Dryness, Hives, Jaundice, New Lesions, Non-Healing Wounds, Rash and Ulcer. HEENT Present- Hearing Loss, Hoarseness, Seasonal Allergies and Wears glasses/contact lenses. Not Present- Earache, Nose Bleed, Oral Ulcers, Ringing in the Ears, Sinus Pain, Sore Throat, Visual Disturbances and Yellow Eyes. Respiratory Present- Difficulty Breathing and Wheezing. Not Present- Bloody sputum, Chronic Cough and Snoring. Breast Present- Breast Mass and Breast Pain. Not Present- Nipple Discharge and Skin Changes. Cardiovascular Present- Palpitations. Not Present- Chest Pain, Difficulty Breathing Lying Down, Leg Cramps, Rapid Heart Rate, Shortness of Breath and Swelling of Extremities. Gastrointestinal Not Present- Abdominal Pain, Bloating, Bloody Stool, Change in Bowel Habits, Chronic diarrhea, Constipation, Difficulty Swallowing, Excessive gas, Gets full quickly at meals, Hemorrhoids, Indigestion, Nausea, Rectal Pain and Vomiting. Female Genitourinary Present- Nocturia. Not Present- Frequency, Painful Urination, Pelvic Pain and Urgency. Musculoskeletal Present- Back Pain. Not Present- Joint Pain, Joint Stiffness, Muscle Pain, Muscle Weakness and Swelling of Extremities. Neurological Not Present- Decreased Memory, Fainting, Headaches, Numbness, Seizures, Tingling, Tremor, Trouble walking and Weakness. Psychiatric Not Present- Anxiety,  Bipolar, Change in Sleep Pattern, Depression, Fearful and Frequent crying. Endocrine Present- Hot flashes. Not Present- Cold Intolerance, Excessive Hunger, Hair Changes, Heat Intolerance and New Diabetes. Hematology Present- Blood Thinners. Not Present- Easy Bruising, Excessive bleeding, Gland problems, HIV and Persistent Infections.  Physical Exam  General: Older WFalert and generally healthy appearing. HEENT: Normal. Pupils equal.  Neck: Supple. No mass. No thyroid mass.  Lymph Nodes: No supraclavicular, cervical or axillary nodes.  Lungs: Clear to auscultation and symmetric breath sounds. Heart: RRR. No murmur or rub. She has a long median sternotomy incision  Breasts: Right - Large central bruise of right breast. Scar at 9 o'clock. I did not feel a mass. Left - No mass or nodule.  Abdomen: Soft. No mass. No tenderness. No hernia. Normal bowel sounds. Insulin pump in RUQ.  Extremities: Good strength and ROM in upper and lower extremities.  Neurologic: Grossly intact to motor and sensory function. Psychiatric: Has normal mood and affect. Behavior is normal.  Assessment & Plan  1.  BREAST CANCER, STAGE 2, RIGHT (C50.911)  Story: Right biopsy at 12:30 o'clock on 12/14/2015 ([id number]) - which showed 2 areas of IDC, grade 2, ER - 0%, PR - 0%, Ki-67 50 and 70%, Her2Neu - neg.   On mammogram, one area is 1.6 cm and one area is 0.8 cm - they are 1.8 cm apart.   Oncology - Hannah [name] and Hannah [name]   Plan:   1) Cardiac clearance    Addendum Note(Alvena Kiernan H. Lucia Gaskins MD; 12/26/2015 7:54 AM)    Received cardiac clearance by Dr. Aundra Dubin - 12/21/2015   2) hold Plavix for 1 week,  3) Right breast lumpectomy (seed loc), right axillary sentinel lymph node biopsy, power port placement,   4) chemotx,   5) rad tx,   6) anti-hormone tx   Addendum Note(Quantia Grullon H. Lucia Gaskins MD; 12/26/2015 1:24 PM)   I spoke to Dr. Purcell Nails. He thought one seed would localize both of these areas.   Addendum  Note(Horris Speros H. Lucia Gaskins MD; 12/28/2015 2:30 PM)   She called about questions regarding lumpectomy vs mastectomy. I told her that, with a mastectomy, she may not need rad tx, but she will need chemo tx either way.  2. CAD - sees Dr. Einar [name]  Last saw him 09/29/2015  History of CABG - 1998, stent in 1999.  ARV in 2009  There has been some questions of A fib vs SVT - she saw Dr. Lovena [name] on 03/17/2015  3. Has bioprosthetic AVR 4. On plavix  She stopped the Plavix for her renal procedure last year and did fine 5. DM - sees Dr. Eduard [name]  Most recent Hgb A1C - 7.8 6. Sees Hannah [name] for asthma  Averages prednisone 3 to 4 times per year 7. Back issues  She had MRI of her back on 11/15/2015 which showed scoliosis and other changes  She sees Dr. Vertell [name] and has had injections in the past 8. Mini stroke in 2014.  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 History of kidneys stones  Dr. Osborn Coho, MD, [facility] Pager: [phone] Office phone:  [phone]

## 2016-01-18 ENCOUNTER — Ambulatory Visit (HOSPITAL_COMMUNITY): Payer: Medicare Other

## 2016-01-18 ENCOUNTER — Ambulatory Visit (HOSPITAL_COMMUNITY)
Admission: RE | Admit: 2016-01-18 | Discharge: 2016-01-18 | Disposition: A | Discharge status: Home / Self Care | Payer: Medicare Other | Source: Ambulatory Visit | Attending: Surgery | Admitting: Surgery

## 2016-01-18 ENCOUNTER — Ambulatory Visit (HOSPITAL_COMMUNITY): Payer: Medicare Other | Admitting: Anesthesiology

## 2016-01-18 ENCOUNTER — Other Ambulatory Visit: Payer: Medicare Other

## 2016-01-18 ENCOUNTER — Ambulatory Visit
Admission: RE | Admit: 2016-01-18 | Discharge: 2016-01-18 | Disposition: A | Discharge status: Home / Self Care | Payer: Medicare Other | Source: Ambulatory Visit | Attending: Surgery | Admitting: Surgery

## 2016-01-18 ENCOUNTER — Encounter (HOSPITAL_COMMUNITY): Payer: Self-pay | Admitting: *Deleted

## 2016-01-18 ENCOUNTER — Encounter (HOSPITAL_COMMUNITY)
Admission: RE | Disposition: A | Discharge status: Home / Self Care | Payer: Self-pay | Source: Ambulatory Visit | Attending: Surgery

## 2016-01-18 DIAGNOSIS — Z8249 Family history of ischemic heart disease and other diseases of the circulatory system: Secondary | ICD-10-CM | POA: Insufficient documentation

## 2016-01-18 DIAGNOSIS — I110 Hypertensive heart disease with heart failure: Secondary | ICD-10-CM | POA: Insufficient documentation

## 2016-01-18 DIAGNOSIS — C50911 Malignant neoplasm of unspecified site of right female breast: Secondary | ICD-10-CM

## 2016-01-18 DIAGNOSIS — I739 Peripheral vascular disease, unspecified: Secondary | ICD-10-CM | POA: Diagnosis not present

## 2016-01-18 DIAGNOSIS — C50111 Malignant neoplasm of central portion of right female breast: Secondary | ICD-10-CM | POA: Insufficient documentation

## 2016-01-18 DIAGNOSIS — Z794 Long term (current) use of insulin: Secondary | ICD-10-CM | POA: Insufficient documentation

## 2016-01-18 DIAGNOSIS — J45909 Unspecified asthma, uncomplicated: Secondary | ICD-10-CM | POA: Insufficient documentation

## 2016-01-18 DIAGNOSIS — M419 Scoliosis, unspecified: Secondary | ICD-10-CM | POA: Insufficient documentation

## 2016-01-18 DIAGNOSIS — I2510 Atherosclerotic heart disease of native coronary artery without angina pectoris: Secondary | ICD-10-CM | POA: Diagnosis not present

## 2016-01-18 DIAGNOSIS — I509 Heart failure, unspecified: Secondary | ICD-10-CM | POA: Insufficient documentation

## 2016-01-18 DIAGNOSIS — Z953 Presence of xenogenic heart valve: Secondary | ICD-10-CM | POA: Diagnosis not present

## 2016-01-18 DIAGNOSIS — I4891 Unspecified atrial fibrillation: Secondary | ICD-10-CM | POA: Diagnosis not present

## 2016-01-18 DIAGNOSIS — Z95828 Presence of other vascular implants and grafts: Secondary | ICD-10-CM

## 2016-01-18 DIAGNOSIS — Z8673 Personal history of transient ischemic attack (TIA), and cerebral infarction without residual deficits: Secondary | ICD-10-CM | POA: Diagnosis not present

## 2016-01-18 DIAGNOSIS — C50919 Malignant neoplasm of unspecified site of unspecified female breast: Secondary | ICD-10-CM

## 2016-01-18 DIAGNOSIS — Z951 Presence of aortocoronary bypass graft: Secondary | ICD-10-CM | POA: Diagnosis not present

## 2016-01-18 DIAGNOSIS — E119 Type 2 diabetes mellitus without complications: Secondary | ICD-10-CM | POA: Diagnosis not present

## 2016-01-18 HISTORY — PX: BREAST LUMPECTOMY: SHX2

## 2016-01-18 HISTORY — PX: BREAST LUMPECTOMY WITH RADIOACTIVE SEED AND SENTINEL LYMPH NODE BIOPSY: SHX6550

## 2016-01-18 HISTORY — PX: PORTACATH PLACEMENT: SHX2246

## 2016-01-18 HISTORY — DX: Malignant neoplasm of unspecified site of unspecified female breast: C50.919

## 2016-01-18 LAB — GLUCOSE, CAPILLARY
Glucose-Capillary: 174 mg/dL — ABNORMAL HIGH (ref 65–99)
Glucose-Capillary: 113 mg/dL — ABNORMAL HIGH (ref 65–99)

## 2016-01-18 SURGERY — BREAST LUMPECTOMY WITH RADIOACTIVE SEED AND SENTINEL LYMPH NODE BIOPSY
Anesthesia: Regional | Site: Chest | Laterality: Right

## 2016-01-18 MED ORDER — BUPIVACAINE-EPINEPHRINE (PF) 0.5% -1:200000 IJ SOLN
INTRAMUSCULAR | Status: DC | PRN
  Administered 2016-01-18: 30 mL

## 2016-01-18 MED ORDER — SCOPOLAMINE 1 MG/3DAYS TD PT72
MEDICATED_PATCH | TRANSDERMAL | Status: DC | PRN
  Administered 2016-01-18: 1 via TRANSDERMAL

## 2016-01-18 MED ORDER — MIDAZOLAM HCL 2 MG/2ML IJ SOLN
INTRAMUSCULAR | Status: AC
  Filled 2016-01-18: qty 2

## 2016-01-18 MED ORDER — PROPOFOL 10 MG/ML IV BOLUS
INTRAVENOUS | Status: AC
  Filled 2016-01-18: qty 20

## 2016-01-18 MED ORDER — HEPARIN SOD (PORK) LOCK FLUSH 100 UNIT/ML IV SOLN
INTRAVENOUS | Status: AC
  Filled 2016-01-18: qty 5

## 2016-01-18 MED ORDER — HYDROMORPHONE HCL 1 MG/ML IJ SOLN: 0.2500 mg | INTRAMUSCULAR | Status: DC | PRN

## 2016-01-18 MED ORDER — SODIUM CHLORIDE 0.9 % IV SOLN
INTRAVENOUS | Status: DC | PRN
  Administered 2016-01-18: 500 mL

## 2016-01-18 MED ORDER — SCOPOLAMINE 1 MG/3DAYS TD PT72
MEDICATED_PATCH | TRANSDERMAL | Status: AC
  Filled 2016-01-18: qty 1

## 2016-01-18 MED ORDER — PHENYLEPHRINE 40 MCG/ML (10ML) SYRINGE FOR IV PUSH (FOR BLOOD PRESSURE SUPPORT)
PREFILLED_SYRINGE | INTRAVENOUS | Status: AC
  Filled 2016-01-18: qty 10

## 2016-01-18 MED ORDER — PHENYLEPHRINE HCL 10 MG/ML IJ SOLN
INTRAMUSCULAR | Status: DC | PRN
  Administered 2016-01-18: 80 ug via INTRAVENOUS
  Administered 2016-01-18: 40 ug via INTRAVENOUS
  Administered 2016-01-18 (×2): 80 ug via INTRAVENOUS

## 2016-01-18 MED ORDER — FENTANYL CITRATE (PF) 250 MCG/5ML IJ SOLN
INTRAMUSCULAR | Status: AC
  Filled 2016-01-18: qty 5

## 2016-01-18 MED ORDER — HEPARIN SOD (PORK) LOCK FLUSH 100 UNIT/ML IV SOLN
INTRAVENOUS | Status: DC | PRN
  Administered 2016-01-18: 500 [IU] via INTRAVENOUS

## 2016-01-18 MED ORDER — TECHNETIUM TC 99M SULFUR COLLOID FILTERED
1.0000 | Freq: Once | INTRAVENOUS | Status: AC | PRN
  Administered 2016-01-18: 1 via INTRADERMAL

## 2016-01-18 MED ORDER — BUPIVACAINE-EPINEPHRINE (PF) 0.25% -1:200000 IJ SOLN
INTRAMUSCULAR | Status: AC
  Filled 2016-01-18: qty 30

## 2016-01-18 MED ORDER — ONDANSETRON HCL 4 MG/2ML IJ SOLN
INTRAMUSCULAR | Status: AC
  Filled 2016-01-18: qty 2

## 2016-01-18 MED ORDER — PROPOFOL 10 MG/ML IV BOLUS
INTRAVENOUS | Status: DC | PRN
  Administered 2016-01-18: 150 mg via INTRAVENOUS
  Administered 2016-01-18: 50 mg via INTRAVENOUS

## 2016-01-18 MED ORDER — SUGAMMADEX SODIUM 200 MG/2ML IV SOLN
INTRAVENOUS | Status: DC | PRN
  Administered 2016-01-18: 200 mg via INTRAVENOUS

## 2016-01-18 MED ORDER — ONDANSETRON HCL 4 MG/2ML IJ SOLN
INTRAMUSCULAR | Status: DC | PRN
  Administered 2016-01-18: 4 mg via INTRAVENOUS

## 2016-01-18 MED ORDER — LIDOCAINE 2% (20 MG/ML) 5 ML SYRINGE
INTRAMUSCULAR | Status: AC
  Filled 2016-01-18: qty 5

## 2016-01-18 MED ORDER — METHYLENE BLUE 0.5 % INJ SOLN
INTRAVENOUS | Status: AC
  Filled 2016-01-18: qty 10

## 2016-01-18 MED ORDER — LACTATED RINGERS IV SOLN
INTRAVENOUS | Status: DC | PRN
  Administered 2016-01-18 (×2): via INTRAVENOUS

## 2016-01-18 MED ORDER — CHLORHEXIDINE GLUCONATE CLOTH 2 % EX PADS: 6.0000 | MEDICATED_PAD | Freq: Once | CUTANEOUS | Status: DC

## 2016-01-18 MED ORDER — EPHEDRINE SULFATE 50 MG/ML IJ SOLN
INTRAMUSCULAR | Status: DC | PRN
  Administered 2016-01-18 (×3): 10 mg via INTRAVENOUS
  Administered 2016-01-18: 5 mg via INTRAVENOUS

## 2016-01-18 MED ORDER — BUPIVACAINE-EPINEPHRINE 0.25% -1:200000 IJ SOLN
INTRAMUSCULAR | Status: DC | PRN
  Administered 2016-01-18: 15 mL

## 2016-01-18 MED ORDER — LIDOCAINE HCL (CARDIAC) 20 MG/ML IV SOLN
INTRAVENOUS | Status: DC | PRN
  Administered 2016-01-18: 100 mg via INTRATRACHEAL

## 2016-01-18 MED ORDER — SODIUM CHLORIDE 0.9 % IJ SOLN
INTRAMUSCULAR | Status: AC
  Filled 2016-01-18: qty 10

## 2016-01-18 MED ORDER — SUGAMMADEX SODIUM 200 MG/2ML IV SOLN
INTRAVENOUS | Status: AC
  Filled 2016-01-18: qty 2

## 2016-01-18 MED ORDER — HYDROCODONE-ACETAMINOPHEN 5-325 MG PO TABS: 1.0000 | ORAL_TABLET | Freq: Four times a day (QID) | ORAL | 0 refills | Status: DC | PRN

## 2016-01-18 MED ORDER — 0.9 % SODIUM CHLORIDE (POUR BTL) OPTIME
TOPICAL | Status: DC | PRN
  Administered 2016-01-18: 1000 mL

## 2016-01-18 MED ORDER — EPHEDRINE 5 MG/ML INJ
INTRAVENOUS | Status: AC
  Filled 2016-01-18: qty 10

## 2016-01-18 MED ORDER — LIDOCAINE HCL (PF) 1 % IJ SOLN
INTRAMUSCULAR | Status: AC
  Filled 2016-01-18: qty 30

## 2016-01-18 MED ORDER — SODIUM CHLORIDE 0.9 % IJ SOLN
INTRAVENOUS | Status: DC | PRN
  Administered 2016-01-18: 5 mL

## 2016-01-18 MED ORDER — ROCURONIUM BROMIDE 100 MG/10ML IV SOLN
INTRAVENOUS | Status: DC | PRN
Start: 2016-01-18 — End: 2016-01-18
  Administered 2016-01-18: 40 mg via INTRAVENOUS

## 2016-01-18 MED ORDER — FENTANYL CITRATE (PF) 100 MCG/2ML IJ SOLN
INTRAMUSCULAR | Status: DC | PRN
  Administered 2016-01-18 (×2): 50 ug via INTRAVENOUS

## 2016-01-18 MED ORDER — MIDAZOLAM HCL 5 MG/5ML IJ SOLN
INTRAMUSCULAR | Status: DC | PRN
  Administered 2016-01-18 (×2): 1 mg via INTRAVENOUS

## 2016-01-18 MED ORDER — ROCURONIUM BROMIDE 10 MG/ML (PF) SYRINGE
PREFILLED_SYRINGE | INTRAVENOUS | Status: AC
  Filled 2016-01-18: qty 10

## 2016-01-18 SURGICAL SUPPLY — 62 items
APPLIER CLIP 9.375 MED OPEN (MISCELLANEOUS) ×3
BAG DECANTER FOR FLEXI CONT (MISCELLANEOUS) ×3 IMPLANT
BENZOIN TINCTURE PRP APPL 2/3 (GAUZE/BANDAGES/DRESSINGS) ×3 IMPLANT
BINDER BREAST LRG (GAUZE/BANDAGES/DRESSINGS) IMPLANT
BINDER BREAST XLRG (GAUZE/BANDAGES/DRESSINGS) IMPLANT
BLADE SURG 15 STRL LF DISP TIS (BLADE) ×2 IMPLANT
BLADE SURG 15 STRL SS (BLADE) ×1
CANISTER SUCTION 2500CC (MISCELLANEOUS) ×3 IMPLANT
CHLORAPREP W/TINT 10.5 ML (MISCELLANEOUS) ×3 IMPLANT
CHLORAPREP W/TINT 26ML (MISCELLANEOUS) ×3 IMPLANT
CLIP APPLIE 9.375 MED OPEN (MISCELLANEOUS) ×2 IMPLANT
CLIP TI WIDE RED SMALL 6 (CLIP) ×3 IMPLANT
COVER PROBE W GEL 5X96 (DRAPES) ×3 IMPLANT
COVER SURGICAL LIGHT HANDLE (MISCELLANEOUS) ×6 IMPLANT
COVER TRANSDUCER ULTRASND GEL (DRAPE) IMPLANT
CRADLE DONUT ADULT HEAD (MISCELLANEOUS) ×3 IMPLANT
DRAPE C-ARM 42X72 X-RAY (DRAPES) ×3 IMPLANT
DRAPE CHEST BREAST 15X10 FENES (DRAPES) ×6 IMPLANT
DRAPE UTILITY XL STRL (DRAPES) ×6 IMPLANT
ELECT CAUTERY BLADE 6.4 (BLADE) ×6 IMPLANT
ELECT COATED BLADE 2.86 ST (ELECTRODE) ×6 IMPLANT
ELECT REM PT RETURN 9FT ADLT (ELECTROSURGICAL) ×3
ELECTRODE REM PT RTRN 9FT ADLT (ELECTROSURGICAL) ×2 IMPLANT
GAUZE SPONGE 4X4 12PLY STRL (GAUZE/BANDAGES/DRESSINGS) ×3 IMPLANT
GAUZE SPONGE 4X4 16PLY XRAY LF (GAUZE/BANDAGES/DRESSINGS) ×3 IMPLANT
GEL ULTRASOUND 20GR AQUASONIC (MISCELLANEOUS) ×3 IMPLANT
GLOVE SURG SIGNA 7.5 PF LTX (GLOVE) ×9 IMPLANT
GOWN STRL REUS W/ TWL LRG LVL3 (GOWN DISPOSABLE) IMPLANT
GOWN STRL REUS W/ TWL XL LVL3 (GOWN DISPOSABLE) ×6 IMPLANT
GOWN STRL REUS W/TWL LRG LVL3 (GOWN DISPOSABLE)
GOWN STRL REUS W/TWL XL LVL3 (GOWN DISPOSABLE) ×3
INTRODUCER 13FR (MISCELLANEOUS) IMPLANT
KIT BASIN OR (CUSTOM PROCEDURE TRAY) ×3 IMPLANT
KIT MARKER MARGIN INK (KITS) ×3 IMPLANT
KIT PORT POWER 8FR ISP CVUE (Catheter) ×3 IMPLANT
KIT ROOM TURNOVER OR (KITS) ×3 IMPLANT
LIQUID BAND (GAUZE/BANDAGES/DRESSINGS) ×6 IMPLANT
NDL SAFETY ECLIPSE 18X1.5 (NEEDLE) ×2 IMPLANT
NEEDLE FILTER BLUNT 18X 1/2SAF (NEEDLE) ×1
NEEDLE FILTER BLUNT 18X1 1/2 (NEEDLE) ×2 IMPLANT
NEEDLE HYPO 18GX1.5 SHARP (NEEDLE) ×1
NEEDLE HYPO 25GX1X1/2 BEV (NEEDLE) ×6 IMPLANT
NEEDLE HYPO 25X1 1.5 SAFETY (NEEDLE) ×6 IMPLANT
NS IRRIG 1000ML POUR BTL (IV SOLUTION) ×3 IMPLANT
PACK SURGICAL SETUP 50X90 (CUSTOM PROCEDURE TRAY) ×3 IMPLANT
PAD ARMBOARD 7.5X6 YLW CONV (MISCELLANEOUS) ×3 IMPLANT
PENCIL BUTTON HOLSTER BLD 10FT (ELECTRODE) ×6 IMPLANT
SET SHEATH INTRODUCER 10FR (MISCELLANEOUS) IMPLANT
SHEATH COOK PEEL AWAY SET 9F (SHEATH) IMPLANT
SPONGE LAP 18X18 X RAY DECT (DISPOSABLE) ×3 IMPLANT
STRIP CLOSURE SKIN 1/4X4 (GAUZE/BANDAGES/DRESSINGS) ×3 IMPLANT
SUT MNCRL AB 4-0 PS2 18 (SUTURE) ×6 IMPLANT
SUT VIC AB 3-0 SH 18 (SUTURE) IMPLANT
SUT VIC AB 3-0 SH 8-18 (SUTURE) ×9 IMPLANT
SYR 20ML ECCENTRIC (SYRINGE) ×6 IMPLANT
SYR 5ML LUER SLIP (SYRINGE) ×3 IMPLANT
SYR BULB 3OZ (MISCELLANEOUS) ×3 IMPLANT
SYR CONTROL 10ML LL (SYRINGE) ×9 IMPLANT
TOWEL OR 17X24 6PK STRL BLUE (TOWEL DISPOSABLE) ×3 IMPLANT
TOWEL OR 17X26 10 PK STRL BLUE (TOWEL DISPOSABLE) ×3 IMPLANT
TUBE CONNECTING 12X1/4 (SUCTIONS) ×6 IMPLANT
YANKAUER SUCT BULB TIP NO VENT (SUCTIONS) ×3 IMPLANT

## 2016-01-18 NOTE — Discharge Instructions (Addendum)
[facility] - DISCHARGE INSTRUCTIONS TO PATIENT  Activity:  Driving - May drive in 2 or 3 days, if doing well.  You should not drive if you are taking pain meds.   Lifting - No lifting more than 15 pounds for one week, then no limit  Wound Care:   Leave incisions covered for 2 days, then may take bandages off and shower.  Diet:  As tolerated.  Follow up appointment:  Call Dr. Pollie Friar office [facility]) at [phone] for an appointment in 2 to 3 weeks.  Medications and dosages:  Resume your home medications. For your Plavix, you may restart it on Friday, if doing well.  You have a prescription for:  Vicodin  Call Dr. Lucia Gaskins or his office  [phone]) if you have:  Temperature greater than 100.4,  Persistent nausea and vomiting,  Severe uncontrolled pain,  Redness, tenderness, or signs of infection (pain, swelling, redness, odor or green/yellow discharge around the site),  Difficulty breathing, headache or visual disturbances,  Any other questions or concerns you may have after discharge.  In an emergency, call 911 or go to an Emergency Department at a nearby hospital.

## 2016-01-18 NOTE — Interval H&P Note (Signed)
History and Physical Interval Note:  01/18/2016 8:20 AM  Hannah [name]  has presented today for surgery, with the diagnosis of RIGHT BREAST CANCER  The various methods of treatment have been discussed with the patient and family.   Seed in place.  Husband at bedside.  Questions answered.  After consideration of risks, benefits and other options for treatment, the patient has consented to  Procedure(s): RIGHT BREAST LUMPECTOMY WITH RADIOACTIVE SEED AND SENTINEL LYMPH NODE BIOPSY (Right) INSERTION PORT-A-CATH (N/A) as a surgical intervention .  The patient's history has been reviewed, patient examined, no change in status, stable for surgery.  I have reviewed the patient's chart and labs.  Questions were answered to the patient's satisfaction.     Hannah [name] H

## 2016-01-18 NOTE — Op Note (Addendum)
01/18/2016  10:55 AM  PATIENT:  Hannah [name] DOB: 07/05/1945 MRN: [id number]  PREOP DIAGNOSIS:  RIGHT BREAST CANCER x 2  POSTOP DIAGNOSIS:   Right breast cancer x 2, 12 o'clock position (T1, N0)  PROCEDURE:   Procedure(s):  RIGHT BREAST LUMPECTOMY WITH single RADIOACTIVE SEED AND SENTINEL LYMPH NODE BIOPSY,  Injection of peri areolar area of breast with methylene blue (1.0 cc), INSERTION left subclavian PORT-A-CATH  SURGEON:    , M.D.  ANESTHESIA:   general  Anesthesiologist: William Fitzgerald, MD CRNA: Christine T Hayes, CRNA; Valerie M Decarlo, CRNA  General  EBL:  100  ml  DRAINS: none   LOCAL MEDICATIONS USED:   15 cc 1/4% marcaine, right pectoral block  SPECIMEN:   Right breast lumpectomy, inferior margin of right breast lumpectomy, right axillary sentinel lymph node biopsy.  COUNTS CORRECT:  YES  INDICATIONS FOR PROCEDURE:  Hannah [name] is a 70 y.o. (DOB: 08/17/1945) white female whose primary care physician is Everly, Rebecca B, DO and comes for right breast lumpectomy and right axillary sentinel lymph node biopsy.   She was seen at the [facility] with Drs. Gudena and Kinard.  She had two biopsies of her right breast which showed invasive ductal ca.  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   The options for breast cancer treatment have been discussed with the patient. She elected to proceed with lumpectomy and axillary sentinel lymph node.     The indications and potential complications of surgery were explained to the patient. Potential complications include, but are not limited to, bleeding, infection, the need for further surgery, and nerve injury.  I also talked about the risk of power port placement - which are similar to the breast surgery, except for the risk of pneumothorax.    She had a I131 seed placed on 01/16/2016 in her right breast at [facility].  I confirmed the presence of the I131 seed in the  pre op area using the Neoprobe.  The seed is in the 12 o'clock position of the right breast.   In the holding area, her right areola was injected with 1 millicurie of Technitium Sulfur Colloid.  OPERATIVE NOTE:   The patient was taken to room # 2 at [facility] OR where she underwent a general anesthesia  supervised by Anesthesiologist: William Fitzgerald, MD CRNA: Christine T Hayes, CRNA; Valerie M Decarlo, CRNA. Her right breast and axilla were prepped with  ChloraPrep and sterilely draped.    A time-out and the surgical check list was reviewed.    I injected about 1.0 mL of 40% methylene blue around her right areola.   I turned attention to the cancer which was about at the 12 o'clock position of the right breast.   I used the Neoprobe to identify the I131 seed.  I tried to excise an area around the tumor of at least 1 cm.    I excised this block of breast tissue approximately 5 cm by 5 cm  in diameter.  I took the lumpectomy down to the pectoralis muscle.   I painted the lumpectomy specimen with the 6 color paint kit and did a specimen mammogram which confirmed the mass, clip, and the seed were all in the right position in the specimen.  The specimen was sent to pathology who called back to confirm that they have the seed and the specimen.   If anything, the tumor in the specimen was close to the   inferior margin, so I took out an additional block of breast tissue from the inferior margin and painted it.  I put a suture on the anterior margin of the specimen.   I then started the right axillary sentinel lymph node biopsy. I made an incision in the right axilla.  I found a hot area at the junction of the breast and the pectoralis major muscle. I cut down and  identified a hot node that had counts of 40 and the background has 0 counts. The lymph node was not blue, but was 'hot". I checked her internal mammary nodes and supraclavicular nodes with the neoprobe and found no other hot area. The axillary node was  then sent to pathology.    I then irrigated the wound with saline. I infiltrated approximately 15 mL of 1/4% marcaine between the incisions.  I limited the amount of local because of the pectoral block I placed 4 clips to mark biopsy cavity, at 12, 3, 6, and 9 o'clock.   I then closed all the wounds in layers using 3-0 Vicryl sutures for the deep layer. At the skin, I closed the incisions with a 4-0 Monocryl suture. The incisions were then painted with LiquiBand.     The patient was repositioned and redraped.  Another time out was held and the surgery checklist reviewed.   The patient was placed in Trendelenburg position.  The left subclavian vein was accessed with a 16 gauge needle and a guide wire threaded through the needle into the vein.  The position of the wire was checked with fluoroscopy.   I then developed a pocket in the upper inner aspect of the left chest for the port reservoir.  I used the Bard Power Port (Clear Vue) kit for venous access.  The reservoir was sewn in place with a 3-0 Vicryl suture.  The reservoir had been flushed with dilute (10 units/cc) heparin.   I then passed the silastic tubing from the reservoir incision to the subclavian stick site and used the 8 French introducer to pass it into the vein.  The tip of the silastic catheter was position at the junction of the SVC and the right atrium under fluoroscopy.  The silastic catheter was then attached to the port with the bayonet device.     The entire port and tubing were checked with fluoroscopy and then the port was flushed with 4 cc of concentrated heparin (100 units/cc).   The wounds were then closed with 3-0 vicryl subcutaneous sutures and the skin closed with a 4-0 Monocryl suture.  The skin was painted with LiquidBand.   She had gauze place over the wounds and placed in a breast binder.   The patient tolerated the procedure well, was transported to the recovery room in good condition. Sponge and needle count were  correct at the end of the case.   Final pathology is pending.    , MD, FACS [facility] Pager: [phone] Office phone:  [phone]      

## 2016-01-18 NOTE — Transfer of Care (Signed)
Immediate Anesthesia Transfer of Care Note  Patient: Hannah [name]  Procedure(s) Performed: Procedure(s): RIGHT BREAST LUMPECTOMY WITH RADIOACTIVE SEED AND SENTINEL LYMPH NODE BIOPSY (Right) INSERTION PORT-A-CATH (Left)  Patient Location: PACU  Anesthesia Type:General  Level of Consciousness: arousable, responds to stimulation  Airway & Oxygen Therapy: Patient Spontanous Breathing and Patient connected to nasal cannula oxygen  Post-op Assessment: Report given to RN and Post -op Vital signs reviewed and stable  Post vital signs: Reviewed and stable  Last Vitals:  Vitals:   01/18/16 0651  BP: (!) 151/46  Pulse: 72  Resp: 18  Temp: 37 C    Last Pain:  Vitals:   01/18/16 0651  TempSrc: Oral      Patients Stated Pain Goal: 2 ([id number])  Complications: No apparent anesthesia complications

## 2016-01-18 NOTE — Anesthesia Postprocedure Evaluation (Signed)
Anesthesia Post Note  Patient: SANTERIA BUFFO  Procedure(s) Performed: Procedure(s) (LRB): RIGHT BREAST LUMPECTOMY WITH RADIOACTIVE SEED AND SENTINEL LYMPH NODE BIOPSY (Right) INSERTION PORT-A-CATH (Left)  Patient location during evaluation: PACU Anesthesia Type: General and Regional Level of consciousness: awake and alert Pain management: pain level controlled Vital Signs Assessment: post-procedure vital signs reviewed and stable Respiratory status: spontaneous breathing, nonlabored ventilation and respiratory function stable Cardiovascular status: blood pressure returned to baseline and stable Postop Assessment: no signs of nausea or vomiting Anesthetic complications: no    Last Vitals:  Vitals:   01/18/16 1212 01/18/16 1218  BP:  (!) 132/58  Pulse:  64  Resp:    Temp: 36.4 C     Last Pain:  Vitals:   01/18/16 1108  TempSrc:   PainSc: Asleep                 Daryl Beehler,W. EDMOND

## 2016-01-18 NOTE — Progress Notes (Signed)
Pt removed insulin pump last night. She was instructed to take 35 units of Lantus per Dr Dwyane Dee.

## 2016-01-18 NOTE — Anesthesia Procedure Notes (Addendum)
Anesthesia Regional Block:  Pectoralis block  Pre-Anesthetic Checklist: ,, timeout performed, Correct Patient, Correct Site, Correct Laterality, Correct Procedure, Correct Position, site marked, Risks and benefits discussed, pre-op evaluation,  At surgeon's request and post-op pain management  Laterality: Right  Prep: Maximum Sterile Barrier Precautions used, chloraprep       Needles:  Injection technique: Single-shot  Needle Type: Echogenic Stimulator Needle     Needle Length: 9cm 9 cm Needle Gauge: 21 and 21 G    Additional Needles:  Procedures: ultrasound guided (picture in chart) Pectoralis block Narrative:  Start time: 01/18/2016 8:20 AM End time: 01/18/2016 8:28 AM Injection made incrementally with aspirations every 5 mL. Anesthesiologist: Roderic Palau  Additional Notes: 2% Lidocaine skin wheel.

## 2016-01-19 ENCOUNTER — Encounter (HOSPITAL_COMMUNITY): Payer: Self-pay | Admitting: Surgery

## 2016-01-20 ENCOUNTER — Ambulatory Visit: Payer: Medicare Other | Admitting: Endocrinology

## 2016-01-26 ENCOUNTER — Telehealth: Payer: Self-pay | Admitting: Hematology and Oncology

## 2016-01-26 ENCOUNTER — Other Ambulatory Visit: Payer: Medicare Other

## 2016-01-26 ENCOUNTER — Ambulatory Visit (HOSPITAL_BASED_OUTPATIENT_CLINIC_OR_DEPARTMENT_OTHER): Payer: Medicare Other | Admitting: Hematology and Oncology

## 2016-01-26 ENCOUNTER — Encounter: Payer: Self-pay | Admitting: Hematology and Oncology

## 2016-01-26 VITALS — BP 168/45 | HR 58 | Temp 97.5°F | Resp 18 | Wt 202.0 lb

## 2016-01-26 DIAGNOSIS — C50211 Malignant neoplasm of upper-inner quadrant of right female breast: Secondary | ICD-10-CM

## 2016-01-26 MED ORDER — PROCHLORPERAZINE MALEATE 10 MG PO TABS: 10.0000 mg | ORAL_TABLET | Freq: Four times a day (QID) | ORAL | 1 refills | Status: DC | PRN

## 2016-01-26 MED ORDER — LIDOCAINE-PRILOCAINE 2.5-2.5 % EX CREA: TOPICAL_CREAM | CUTANEOUS | 3 refills | Status: DC

## 2016-01-26 MED ORDER — ONDANSETRON HCL 8 MG PO TABS: 8.0000 mg | ORAL_TABLET | Freq: Two times a day (BID) | ORAL | 1 refills | Status: DC | PRN

## 2016-01-26 NOTE — Progress Notes (Signed)
Patient Care Team: Nicola Girt, DO as PCP - General Alphonsa Overall, MD as Consulting Physician (General Surgery) Nicholas Lose, MD as Consulting Physician (Hematology and Oncology) Gery Pray, MD as Consulting Physician (Radiation Oncology)  DIAGNOSIS: Breast cancer of upper-inner quadrant of right female breast [facility])   Staging form: Breast, AJCC 7th Edition   - Clinical stage from 12/21/2015: Stage IA (T1c, N0, M0) - Unsigned         Staging comments: Staged at breast conference on 7.12.17  SUMMARY OF ONCOLOGIC HISTORY:   Breast cancer of upper-inner quadrant of right female breast ([place])   12/14/2015 Initial Diagnosis    Right breast biopsy 12:30 position: 2 masses, 1.6 cm mass: Invasive ductal carcinoma, grade 2, ER 0%, PR 0%, HER-2 negative, Ki-67 70%; satellite mass 8 mm: IDC grade 2 ER 5%, PR 5%, HER-2 negative, Ki-67 50%; T1cN0 stage IA clinical stage     01/18/2016 Surgery    Right lumpectomy: Multifocal IDC grade 3, 1.9 cm  ER 0%, PR 0%, HER-2 negative, Ki-67 70% and 0.8 cm satellite mass ER 5%, PR 2%, HER-2 negative, Ki-67 50%, high-grade DCIS, margins negative, 0/1 lymph nodes negative T1 cN0 stage IA      CHIEF COMPLIANT: Follow-up after right lumpectomy  INTERVAL HISTORY: Hannah [name] is a 70 year old with above-mentioned history of right breast cancer who underwent lumpectomy and is here today to discuss the pathology report. He is healing very well from recent surgery. She has very mild discomfort especially under the arm.  REVIEW OF SYSTEMS:   Constitutional: Denies fevers, chills or abnormal weight loss Eyes: Denies blurriness of vision Ears, nose, mouth, throat, and face: Denies mucositis or sore throat Respiratory: Denies cough, dyspnea or wheezes Cardiovascular: Denies palpitation, chest discomfort Gastrointestinal:  Denies nausea, heartburn or change in bowel habits Skin: Denies abnormal skin rashes Lymphatics: Denies new lymphadenopathy or easy  bruising Neurological:Denies numbness, tingling or new weaknesses Behavioral/Psych: Mood is stable, no new changes  Extremities: No lower extremity edema Breast: Recent right lumpectomy All other systems were reviewed with the patient and are negative.  I have reviewed the past medical history, past surgical history, social history and family history with the patient and they are unchanged from previous note.  ALLERGIES:  is allergic to imdur [isosorbide dinitrate]; amoxicillin; and tetracycline.  MEDICATIONS:  Current Outpatient Prescriptions  Medication Sig Dispense Refill  . ACCU-CHEK AVIVA PLUS test strip     . alendronate (FOSAMAX) 70 MG tablet Take 70 mg by mouth once a week. Take with a full glass of water on an empty stomach.    Marland Kitchen aspirin EC 81 MG EC tablet Take 1 tablet (81 mg total) by mouth daily.    . budesonide-formoterol (SYMBICORT) 160-4.5 MCG/ACT inhaler INHALE TWO PUFFS INTO LUNGS TWICE DAILY 1 Inhaler 11  . clopidogrel (PLAVIX) 75 MG tablet Take 75 mg by mouth daily.    Marland Kitchen HUMALOG 100 UNIT/ML injection Inject 76 units daily with  V-GO pump 70 mL 2  . HYDROcodone-acetaminophen (NORCO/VICODIN) 5-325 MG tablet Take 1-2 tablets by mouth every 6 (six) hours as needed. 30 tablet 0  . loratadine (CLARITIN) 10 MG tablet Take 10 mg by mouth daily.    . metoprolol succinate (TOPROL-XL) 100 MG 24 hr tablet TAKE ONE TABLET BY MOUTH TWICE DAILY TAKE  WITH  OR  IMMEDIATELY  FOLLOWING  A  MEAL 180 tablet 2  . rosuvastatin (CRESTOR) 40 MG tablet TAKE ONE TABLET BY MOUTH ONCE DAILY 30 tablet  11  . TraMADol HCl 200 MG CP24 Take 1 tablet by mouth daily. Take every day per patient    . TRULICITY 6.96 EX/5.2WU SOPN Once a week. On fridays    . ZETIA 10 MG tablet Take 1 tablet by mouth  daily 90 tablet 2   No current facility-administered medications for this visit.     PHYSICAL EXAMINATION: ECOG PERFORMANCE STATUS: 1 - Symptomatic but completely ambulatory  Vitals:   01/26/16 1142   BP: (!) 168/45  Pulse: (!) 58  Resp: 18  Temp: 97.5 F (36.4 C)   Filed Weights   01/26/16 1142  Weight: 202 lb (91.6 kg)    GENERAL:alert, no distress and comfortable SKIN: skin color, texture, turgor are normal, no rashes or significant lesions EYES: normal, Conjunctiva are pink and non-injected, sclera clear OROPHARYNX:no exudate, no erythema and lips, buccal mucosa, and tongue normal  NECK: supple, thyroid normal size, non-tender, without nodularity LYMPH:  no palpable lymphadenopathy in the cervical, axillary or inguinal LUNGS: clear to auscultation and percussion with normal breathing effort HEART: regular rate & rhythm and no murmurs and no lower extremity edema ABDOMEN:abdomen soft, non-tender and normal bowel sounds MUSCULOSKELETAL:no cyanosis of digits and no clubbing  NEURO: alert & oriented x 3 with fluent speech, no focal motor/sensory deficits EXTREMITIES: No lower extremity edema  LABORATORY DATA:  I have reviewed the data as listed   Chemistry      Component Value Date/Time   NA 140 01/11/2016 1447   NA 142 12/21/2015 0815   K 4.2 01/11/2016 1447   K 5.0 12/21/2015 0815   CL 107 01/11/2016 1447   CO2 27 01/11/2016 1447   CO2 28 12/21/2015 0815   BUN 25 (H) 01/11/2016 1447   BUN 23.6 12/21/2015 0815   CREATININE 1.18 (H) 01/11/2016 1447   CREATININE 1.4 (H) 12/21/2015 0815      Component Value Date/Time   CALCIUM 9.8 01/11/2016 1447   CALCIUM 9.2 12/21/2015 0815   ALKPHOS 56 12/21/2015 0815   AST 31 12/21/2015 0815   ALT 24 12/21/2015 0815   BILITOT 0.85 12/21/2015 0815       Lab Results  Component Value Date   WBC 7.3 01/11/2016   HGB 11.4 (L) 01/11/2016   HCT 36.5 01/11/2016   MCV 90.1 01/11/2016   PLT 183 01/11/2016   NEUTROABS 2.8 12/21/2015     ASSESSMENT & PLAN:  Breast cancer of upper-inner quadrant of right female breast ([facility]) Right breast biopsy 12:30 position 12/14/2015: 2 masses, 1.6 cm mass: Invasive ductal carcinoma, grade  2, ER 0%, PR 0%, HER-2 negative, Ki-67 70%; satellite mass 8 mm: IDC grade 2 ER 5%, PR 5%, HER-2 negative, Ki-67 50%; T1cN0 stage IA clinical stage Right lumpectomy 01/18/2016: Multifocal IDC grade 3, 1.9 cm  ER 0%, PR 0%, HER-2 negative, Ki-67 70% and 0.8 cm satellite mass ER 5%, PR 2%, HER-2 negative, Ki-67 50%, high-grade DCIS, margins negative, 0/1 lymph nodes negative T1 cN0 stage IA  Recommendation: 1. Adjuvant chemotherapy with Taxotere and Cytoxan (4 cycles). Adriamycin is not being given because of her cardiac problems 2. Followed by adjuvant radiation 3  followed by adjuvant antiestrogen therapy (because a satellite tumor is weakly ER and PR positive).   Pathology counseling: I discussed the final pathology report of the patient provided  a copy of this report. I discussed the margins as well as lymph node surgeries. We also discussed the final staging along with previously performed ER/PR and HER-2/neu testing.  Return to clinic 02/17/2016 to start adjuvant chemotherapy   No orders of the defined types were placed in this encounter.  The patient has a good understanding of the overall plan. she agrees with it. she will call with any problems that may develop before the next visit here.   Rulon Eisenmenger, MD 01/26/16

## 2016-01-26 NOTE — Telephone Encounter (Signed)
appt made and avs printed °

## 2016-01-26 NOTE — Assessment & Plan Note (Signed)
Right breast biopsy 12:30 position 12/14/2015: 2 masses, 1.6 cm mass: Invasive ductal carcinoma, grade 2, ER 0%, PR 0%, HER-2 negative, Ki-67 70%; satellite mass 8 mm: IDC grade 2 ER 5%, PR 5%, HER-2 negative, Ki-67 50%; T1cN0 stage IA clinical stage Right lumpectomy 01/18/2016: Multifocal IDC grade 3, 1.9 cm  ER 0%, PR 0%, HER-2 negative, Ki-67 70% and 0.8 cm satellite mass ER 5%, PR 2%, HER-2 negative, Ki-67 50%, high-grade DCIS, margins negative, 0/1 lymph nodes negative T1 cN0 stage IA  Recommendation: 1. Adjuvant chemotherapy with Taxotere and Cytoxan (4 cycles). Adriamycin is not being given because of her cardiac problems 3. Followed by adjuvant radiation 4 followed by adjuvant antiestrogen therapy.   Pathology counseling: I discussed the final pathology report of the patient provided  a copy of this report. I discussed the margins as well as lymph node surgeries. We also discussed the final staging along with previously performed ER/PR and HER-2/neu testing.  Return to clinic in 3 weeks to start adjuvant chemotherapy

## 2016-02-14 ENCOUNTER — Telehealth: Payer: Self-pay | Admitting: *Deleted

## 2016-02-14 NOTE — Telephone Encounter (Signed)
"  I have three medcations from [place] I need to know what to do with them."   Advised how to place Emla cream before lab draw appointment and how to use the zofran and compazine.  "The doctor told me lab is not to be drawn from my port-a-cath.  They can use my arms."  No further questions.

## 2016-02-15 ENCOUNTER — Encounter: Payer: Self-pay | Admitting: Hematology and Oncology

## 2016-02-15 NOTE — Progress Notes (Signed)
Left msg for pt to return my call to discuss copay assistance.

## 2016-02-15 NOTE — Progress Notes (Signed)
Pt returned my call, I introduced myself as her FA.  I discussed w/ her copay assistance as well as the [employer].  Pt will bring her proof of income on 02/17/16 at that time I will apply to PAF and see if she qualifies for the [employer].

## 2016-02-15 NOTE — Progress Notes (Signed)
Pt called back stating that her insurance should pay for her treatment at 100% and at this time she will leave the [place] for someone in greater need.  I will still meet pt on 02/17/16 to give her my contact info in case anything changes in the future she can contact me.

## 2016-02-17 ENCOUNTER — Ambulatory Visit: Payer: Medicare Other

## 2016-02-17 ENCOUNTER — Ambulatory Visit (HOSPITAL_BASED_OUTPATIENT_CLINIC_OR_DEPARTMENT_OTHER): Payer: Medicare Other | Admitting: Hematology and Oncology

## 2016-02-17 ENCOUNTER — Other Ambulatory Visit (HOSPITAL_BASED_OUTPATIENT_CLINIC_OR_DEPARTMENT_OTHER): Payer: Medicare Other

## 2016-02-17 ENCOUNTER — Ambulatory Visit (HOSPITAL_BASED_OUTPATIENT_CLINIC_OR_DEPARTMENT_OTHER): Payer: Medicare Other

## 2016-02-17 ENCOUNTER — Encounter: Payer: Self-pay | Admitting: Hematology and Oncology

## 2016-02-17 VITALS — BP 131/60 | HR 78 | Temp 98.1°F | Resp 18 | Ht 64.0 in | Wt 198.4 lb

## 2016-02-17 VITALS — BP 147/61 | HR 75 | Temp 97.9°F | Resp 16

## 2016-02-17 DIAGNOSIS — Z95828 Presence of other vascular implants and grafts: Secondary | ICD-10-CM | POA: Insufficient documentation

## 2016-02-17 DIAGNOSIS — C50211 Malignant neoplasm of upper-inner quadrant of right female breast: Secondary | ICD-10-CM

## 2016-02-17 DIAGNOSIS — I5043 Acute on chronic combined systolic (congestive) and diastolic (congestive) heart failure: Secondary | ICD-10-CM | POA: Diagnosis not present

## 2016-02-17 DIAGNOSIS — Z5111 Encounter for antineoplastic chemotherapy: Secondary | ICD-10-CM | POA: Diagnosis not present

## 2016-02-17 DIAGNOSIS — E1122 Type 2 diabetes mellitus with diabetic chronic kidney disease: Secondary | ICD-10-CM | POA: Diagnosis not present

## 2016-02-17 DIAGNOSIS — N183 Chronic kidney disease, stage 3 (moderate): Secondary | ICD-10-CM

## 2016-02-17 DIAGNOSIS — N189 Chronic kidney disease, unspecified: Secondary | ICD-10-CM | POA: Diagnosis not present

## 2016-02-17 DIAGNOSIS — N182 Chronic kidney disease, stage 2 (mild): Secondary | ICD-10-CM

## 2016-02-17 DIAGNOSIS — I214 Non-ST elevation (NSTEMI) myocardial infarction: Secondary | ICD-10-CM

## 2016-02-17 HISTORY — DX: Presence of other vascular implants and grafts: Z95.828

## 2016-02-17 LAB — CBC WITH DIFFERENTIAL/PLATELET
BASO%: 1.3 % (ref 0.0–2.0)
Basophils Absolute: 0.1 10*3/uL (ref 0.0–0.1)
EOS%: 10.7 % — ABNORMAL HIGH (ref 0.0–7.0)
Eosinophils Absolute: 0.7 10*3/uL — ABNORMAL HIGH (ref 0.0–0.5)
HCT: 33.6 % — ABNORMAL LOW (ref 34.8–46.6)
HGB: 11.1 g/dL — ABNORMAL LOW (ref 11.6–15.9)
LYMPH%: 18.1 % (ref 14.0–49.7)
MCH: 28.9 pg (ref 25.1–34.0)
MCHC: 32.9 g/dL (ref 31.5–36.0)
MCV: 87.9 fL (ref 79.5–101.0)
MONO#: 0.7 10*3/uL (ref 0.1–0.9)
MONO%: 11.2 % (ref 0.0–14.0)
NEUT#: 3.6 10*3/uL (ref 1.5–6.5)
NEUT%: 58.7 % (ref 38.4–76.8)
Platelets: 156 10*3/uL (ref 145–400)
RBC: 3.82 10*6/uL (ref 3.70–5.45)
RDW: 13.2 % (ref 11.2–14.5)
WBC: 6.2 10*3/uL (ref 3.9–10.3)
lymph#: 1.1 10*3/uL (ref 0.9–3.3)

## 2016-02-17 LAB — COMPREHENSIVE METABOLIC PANEL
ALT: 20 U/L (ref 0–55)
AST: 28 U/L (ref 5–34)
Albumin: 2.9 g/dL — ABNORMAL LOW (ref 3.5–5.0)
Alkaline Phosphatase: 56 U/L (ref 40–150)
Anion Gap: 8 mEq/L (ref 3–11)
BUN: 20.2 mg/dL (ref 7.0–26.0)
CO2: 27 mEq/L (ref 22–29)
Calcium: 9 mg/dL (ref 8.4–10.4)
Chloride: 105 mEq/L (ref 98–109)
Creatinine: 1.2 mg/dL — ABNORMAL HIGH (ref 0.6–1.1)
EGFR: 44 mL/min/{1.73_m2} — ABNORMAL LOW (ref >90)
Glucose: 215 mg/dl — ABNORMAL HIGH (ref 70–140)
Potassium: 4.4 mEq/L (ref 3.5–5.1)
Sodium: 140 mEq/L (ref 136–145)
Total Bilirubin: 0.67 mg/dL (ref 0.20–1.20)
Total Protein: 6.2 g/dL — ABNORMAL LOW (ref 6.4–8.3)

## 2016-02-17 MED ORDER — SODIUM CHLORIDE 0.9% FLUSH
10.0000 mL | INTRAVENOUS | Status: DC | PRN
  Administered 2016-02-17: 10 mL
  Filled 2016-02-17: qty 10

## 2016-02-17 MED ORDER — SODIUM CHLORIDE 0.9 % IV SOLN
75.0000 mg/m2 | Freq: Once | INTRAVENOUS | Status: AC
  Administered 2016-02-17: 150 mg via INTRAVENOUS
  Filled 2016-02-17: qty 15

## 2016-02-17 MED ORDER — SODIUM CHLORIDE 0.9 % IV SOLN
Freq: Once | INTRAVENOUS | Status: AC
  Administered 2016-02-17: 12:00:00 via INTRAVENOUS

## 2016-02-17 MED ORDER — DEXTROSE 5 % IV SOLN: 75.0000 mg/m2 | Freq: Once | INTRAVENOUS | Status: DC

## 2016-02-17 MED ORDER — PEGFILGRASTIM 6 MG/0.6ML ~~LOC~~ PSKT
6.0000 mg | PREFILLED_SYRINGE | Freq: Once | SUBCUTANEOUS | Status: AC
  Administered 2016-02-17: 6 mg via SUBCUTANEOUS
  Filled 2016-02-17: qty 0.6

## 2016-02-17 MED ORDER — CYCLOPHOSPHAMIDE CHEMO INJECTION 1 GM
600.0000 mg/m2 | Freq: Once | INTRAMUSCULAR | Status: AC
  Administered 2016-02-17: 1220 mg via INTRAVENOUS
  Filled 2016-02-17: qty 61

## 2016-02-17 MED ORDER — HEPARIN SOD (PORK) LOCK FLUSH 100 UNIT/ML IV SOLN
500.0000 [IU] | Freq: Once | INTRAVENOUS | Status: AC | PRN
  Administered 2016-02-17: 500 [IU]
  Filled 2016-02-17: qty 5

## 2016-02-17 MED ORDER — PALONOSETRON HCL INJECTION 0.25 MG/5ML
0.2500 mg | Freq: Once | INTRAVENOUS | Status: AC
  Administered 2016-02-17: 0.25 mg via INTRAVENOUS

## 2016-02-17 MED ORDER — PALONOSETRON HCL INJECTION 0.25 MG/5ML
INTRAVENOUS | Status: AC
  Filled 2016-02-17: qty 5

## 2016-02-17 MED ORDER — SODIUM CHLORIDE 0.9 % IJ SOLN
10.0000 mL | INTRAMUSCULAR | Status: DC | PRN
  Administered 2016-02-17: 10 mL via INTRAVENOUS
  Filled 2016-02-17: qty 10

## 2016-02-17 NOTE — Patient Instructions (Addendum)
[facility] Discharge Instructions for Patients Receiving Chemotherapy  Today you received the following chemotherapy agents: Taxotere and Cytoxan   To help prevent nausea and vomiting after your treatment, we encourage you to take your nausea medication as directed.    If you develop nausea and vomiting that is not controlled by your nausea medication, call the clinic.   BELOW ARE SYMPTOMS THAT SHOULD BE REPORTED IMMEDIATELY:  *FEVER GREATER THAN 100.5 F  *CHILLS WITH OR WITHOUT FEVER  NAUSEA AND VOMITING THAT IS NOT CONTROLLED WITH YOUR NAUSEA MEDICATION  *UNUSUAL SHORTNESS OF BREATH  *UNUSUAL BRUISING OR BLEEDING  TENDERNESS IN MOUTH AND THROAT WITH OR WITHOUT PRESENCE OF ULCERS  *URINARY PROBLEMS  *BOWEL PROBLEMS  UNUSUAL RASH Items with * indicate a potential emergency and should be followed up as soon as possible.  Feel free to call the clinic you have any questions or concerns. The clinic phone number is [phone].  Please show the CHEMO ALERT CARD at check-in to the Emergency Department and triage nurse.   Docetaxel injection What is this medicine? DOCETAXEL (doe se TAX el) is a chemotherapy drug. It targets fast dividing cells, like cancer cells, and causes these cells to die. This medicine is used to treat many types of cancers like breast cancer, certain stomach cancers, head and neck cancer, lung cancer, and prostate cancer. This medicine may be used for other purposes; ask your health care provider or pharmacist if you have questions. What should I tell my health care provider before I take this medicine? They need to know if you have any of these conditions: -infection (especially a virus infection such as chickenpox, cold sores, or herpes) -liver disease -low blood counts, like low white cell, platelet, or red cell counts -an unusual or allergic reaction to docetaxel, polysorbate 80, other chemotherapy agents, other medicines, foods, dyes,  or preservatives -pregnant or trying to get pregnant -breast-feeding How should I use this medicine? This drug is given as an infusion into a vein. It is administered in a hospital or clinic by a specially trained health care professional. Talk to your pediatrician regarding the use of this medicine in children. Special care may be needed. Overdosage: If you think you have taken too much of this medicine contact a poison control center or emergency room at once. NOTE: This medicine is only for you. Do not share this medicine with others. What if I miss a dose? It is important not to miss your dose. Call your doctor or health care professional if you are unable to keep an appointment. What may interact with this medicine? -cyclosporine -erythromycin -ketoconazole -medicines to increase blood counts like filgrastim, pegfilgrastim, sargramostim -vaccines Talk to your doctor or health care professional before taking any of these medicines: -acetaminophen -aspirin -ibuprofen -ketoprofen -naproxen This list may not describe all possible interactions. Give your health care provider a list of all the medicines, herbs, non-prescription drugs, or dietary supplements you use. Also tell them if you smoke, drink alcohol, or use illegal drugs. Some items may interact with your medicine. What should I watch for while using this medicine? Your condition will be monitored carefully while you are receiving this medicine. You will need important blood work done while you are taking this medicine. This drug may make you feel generally unwell. This is not uncommon, as chemotherapy can affect healthy cells as well as cancer cells. Report any side effects. Continue your course of treatment even though you feel ill unless your   doctor tells you to stop. In some cases, you may be given additional medicines to help with side effects. Follow all directions for their use. Call your doctor or health care professional  for advice if you get a fever, chills or sore throat, or other symptoms of a cold or flu. Do not treat yourself. This drug decreases your body's ability to fight infections. Try to avoid being around people who are sick. This medicine may increase your risk to bruise or bleed. Call your doctor or health care professional if you notice any unusual bleeding. This medicine may contain alcohol in the product. You may get drowsy or dizzy. Do not drive, use machinery, or do anything that needs mental alertness until you know how this medicine affects you. Do not stand or sit up quickly, especially if you are an older patient. This reduces the risk of dizzy or fainting spells. Avoid alcoholic drinks. Do not become pregnant while taking this medicine. Women should inform their doctor if they wish to become pregnant or think they might be pregnant. There is a potential for serious side effects to an unborn child. Talk to your health care professional or pharmacist for more information. Do not breast-feed an infant while taking this medicine. What side effects may I notice from receiving this medicine? Side effects that you should report to your doctor or health care professional as soon as possible: -allergic reactions like skin rash, itching or hives, swelling of the face, lips, or tongue -low blood counts - This drug may decrease the number of white blood cells, red blood cells and platelets. You may be at increased risk for infections and bleeding. -signs of infection - fever or chills, cough, sore throat, pain or difficulty passing urine -signs of decreased platelets or bleeding - bruising, pinpoint red spots on the skin, black, tarry stools, nosebleeds -signs of decreased red blood cells - unusually weak or tired, fainting spells, lightheadedness -breathing problems -fast or irregular heartbeat -low blood pressure -mouth sores -nausea and vomiting -pain, swelling, redness or irritation at the injection  site -pain, tingling, numbness in the hands or feet -swelling of the ankle, feet, hands -weight gain Side effects that usually do not require medical attention (report to your prescriber or health care professional if they continue or are bothersome): -bone pain -complete hair loss including hair on your head, underarms, pubic hair, eyebrows, and eyelashes -diarrhea -excessive tearing -changes in the color of fingernails -loosening of the fingernails -nausea -muscle pain -red flush to skin -sweating -weak or tired This list may not describe all possible side effects. Call your doctor for medical advice about side effects. You may report side effects to FDA at 1-800-FDA-1088. Where should I keep my medicine? This drug is given in a hospital or clinic and will not be stored at home. NOTE: This sheet is a summary. It may not cover all possible information. If you have questions about this medicine, talk to your doctor, pharmacist, or health care provider.    2016, Elsevier/Gold Standard. (2014-06-14 16:04:57)   Cyclophosphamide injection What is this medicine? CYCLOPHOSPHAMIDE (sye kloe FOSS fa mide) is a chemotherapy drug. It slows the growth of cancer cells. This medicine is used to treat many types of cancer like lymphoma, myeloma, leukemia, breast cancer, and ovarian cancer, to name a few. This medicine may be used for other purposes; ask your health care provider or pharmacist if you have questions. What should I tell my health care provider before I take this   medicine? They need to know if you have any of these conditions: -blood disorders -history of other chemotherapy -infection -kidney disease -liver disease -recent or ongoing radiation therapy -tumors in the bone marrow -an unusual or allergic reaction to cyclophosphamide, other chemotherapy, other medicines, foods, dyes, or preservatives -pregnant or trying to get pregnant -breast-feeding How should I use this  medicine? This drug is usually given as an injection into a vein or muscle or by infusion into a vein. It is administered in a hospital or clinic by a specially trained health care professional. Talk to your pediatrician regarding the use of this medicine in children. Special care may be needed. Overdosage: If you think you have taken too much of this medicine contact a poison control center or emergency room at once. NOTE: This medicine is only for you. Do not share this medicine with others. What if I miss a dose? It is important not to miss your dose. Call your doctor or health care professional if you are unable to keep an appointment. What may interact with this medicine? This medicine may interact with the following medications: -amiodarone -amphotericin B -azathioprine -certain antiviral medicines for HIV or AIDS such as protease inhibitors (e.g., indinavir, ritonavir) and zidovudine -certain blood pressure medications such as benazepril, captopril, enalapril, fosinopril, lisinopril, moexipril, monopril, perindopril, quinapril, ramipril, trandolapril -certain cancer medications such as anthracyclines (e.g., daunorubicin, doxorubicin), busulfan, cytarabine, paclitaxel, pentostatin, tamoxifen, trastuzumab -certain diuretics such as chlorothiazide, chlorthalidone, hydrochlorothiazide, indapamide, metolazone -certain medicines that treat or prevent blood clots like warfarin -certain muscle relaxants such as succinylcholine -cyclosporine -etanercept -indomethacin -medicines to increase blood counts like filgrastim, pegfilgrastim, sargramostim -medicines used as general anesthesia -metronidazole -natalizumab This list may not describe all possible interactions. Give your health care provider a list of all the medicines, herbs, non-prescription drugs, or dietary supplements you use. Also tell them if you smoke, drink alcohol, or use illegal drugs. Some items may interact with your  medicine. What should I watch for while using this medicine? Visit your doctor for checks on your progress. This drug may make you feel generally unwell. This is not uncommon, as chemotherapy can affect healthy cells as well as cancer cells. Report any side effects. Continue your course of treatment even though you feel ill unless your doctor tells you to stop. Drink water or other fluids as directed. Urinate often, even at night. In some cases, you may be given additional medicines to help with side effects. Follow all directions for their use. Call your doctor or health care professional for advice if you get a fever, chills or sore throat, or other symptoms of a cold or flu. Do not treat yourself. This drug decreases your body's ability to fight infections. Try to avoid being around people who are sick. This medicine may increase your risk to bruise or bleed. Call your doctor or health care professional if you notice any unusual bleeding. Be careful brushing and flossing your teeth or using a toothpick because you may get an infection or bleed more easily. If you have any dental work done, tell your dentist you are receiving this medicine. You may get drowsy or dizzy. Do not drive, use machinery, or do anything that needs mental alertness until you know how this medicine affects you. Do not become pregnant while taking this medicine or for 1 year after stopping it. Women should inform their doctor if they wish to become pregnant or think they might be pregnant. Men should not father a child while   taking this medicine and for 4 months after stopping it. There is a potential for serious side effects to an unborn child. Talk to your health care professional or pharmacist for more information. Do not breast-feed an infant while taking this medicine. This medicine may interfere with the ability to have a child. This medicine has caused ovarian failure in some women. This medicine has caused reduced sperm  counts in some men. You should talk with your doctor or health care professional if you are concerned about your fertility. If you are going to have surgery, tell your doctor or health care professional that you have taken this medicine. What side effects may I notice from receiving this medicine? Side effects that you should report to your doctor or health care professional as soon as possible: -allergic reactions like skin rash, itching or hives, swelling of the face, lips, or tongue -low blood counts - this medicine may decrease the number of white blood cells, red blood cells and platelets. You may be at increased risk for infections and bleeding. -signs of infection - fever or chills, cough, sore throat, pain or difficulty passing urine -signs of decreased platelets or bleeding - bruising, pinpoint red spots on the skin, black, tarry stools, blood in the urine -signs of decreased red blood cells - unusually weak or tired, fainting spells, lightheadedness -breathing problems -dark urine -dizziness -palpitations -swelling of the ankles, feet, hands -trouble passing urine or change in the amount of urine -weight gain -yellowing of the eyes or skin Side effects that usually do not require medical attention (report to your doctor or health care professional if they continue or are bothersome): -changes in nail or skin color -hair loss -missed menstrual periods -mouth sores -nausea, vomiting This list may not describe all possible side effects. Call your doctor for medical advice about side effects. You may report side effects to FDA at 1-800-FDA-1088. Where should I keep my medicine? This drug is given in a hospital or clinic and will not be stored at home. NOTE: This sheet is a summary. It may not cover all possible information. If you have questions about this medicine, talk to your doctor, pharmacist, or health care provider.    2016, Elsevier/Gold Standard. (2012-04-11  16:22:58)    

## 2016-02-17 NOTE — Progress Notes (Signed)
Patient Care Team: Nicola Girt, DO as PCP - General Alphonsa Overall, MD as Consulting Physician (General Surgery) Nicholas Lose, MD as Consulting Physician (Hematology and Oncology) Gery Pray, MD as Consulting Physician (Radiation Oncology)  DIAGNOSIS: Breast cancer of upper-inner quadrant of right female breast [facility])   Staging form: Breast, AJCC 7th Edition   - Clinical stage from 12/21/2015: Stage IA (T1c, N0, M0) - Unsigned         Staging comments: Staged at breast conference on 7.12.17  SUMMARY OF ONCOLOGIC HISTORY:   Breast cancer of upper-inner quadrant of right female breast ([employer])   12/14/2015 Initial Diagnosis    Right breast biopsy 12:30 position: 2 masses, 1.6 cm mass: Invasive ductal carcinoma, grade 2, ER 0%, PR 0%, HER-2 negative, Ki-67 70%; satellite mass 8 mm: IDC grade 2 ER 5%, PR 5%, HER-2 negative, Ki-67 50%; T1cN0 stage IA clinical stage      01/18/2016 Surgery    Right lumpectomy: Multifocal IDC grade 3, 1.9 cm  ER 0%, PR 0%, HER-2 negative, Ki-67 70% and 0.8 cm satellite mass ER 5%, PR 2%, HER-2 negative, Ki-67 50%, high-grade DCIS, margins negative, 0/1 lymph nodes negative T1 cN0 stage IA      02/17/2016 -  Anti-estrogen oral therapy    Adjuvant chemotherapy with Taxotere and Cytoxan4       CHIEF COMPLIANT: cycle 1 adjuvant chemotherapy with Taxotere and Cytoxan  INTERVAL HISTORY: Hannah [name] is a 70 year old with above-mentioned history of right breast cancer treated with lumpectomy and is here today to start for cycle of adjuvant chemotherapy with Taxotere and Cytoxan. Port placement went very well. She reports of the breast has healed quite well. Because of patient's history of diabetes, we are not giving steroids with her chemotherapy. Because of her heart history we are doing her Taxotere Cytoxan. Adriamycin is contraindicated.  REVIEW OF SYSTEMS:   Constitutional: Denies fevers, chills or abnormal weight loss Eyes: Denies blurriness of  vision Ears, nose, mouth, throat, and face: Denies mucositis or sore throat Respiratory: Denies cough, dyspnea or wheezes Cardiovascular: Denies palpitation, chest discomfort Gastrointestinal:  Denies nausea, heartburn or change in bowel habits Skin: Denies abnormal skin rashes Lymphatics: Denies new lymphadenopathy or easy bruising Neurological:Denies numbness, tingling or new weaknesses Behavioral/Psych: Mood is stable, no new changes  Extremities: No lower extremity edema Breast:  denies any pain or lumps or nodules in either breasts All other systems were reviewed with the patient and are negative.  I have reviewed the past medical history, past surgical history, social history and family history with the patient and they are unchanged from previous note.  ALLERGIES:  is allergic to imdur [isosorbide dinitrate]; amoxicillin; and tetracycline.  MEDICATIONS:  Current Outpatient Prescriptions  Medication Sig Dispense Refill  . ACCU-CHEK AVIVA PLUS test strip     . alendronate (FOSAMAX) 70 MG tablet Take 70 mg by mouth once a week. Take with a full glass of water on an empty stomach.    Marland Kitchen aspirin EC 81 MG EC tablet Take 1 tablet (81 mg total) by mouth daily.    . budesonide-formoterol (SYMBICORT) 160-4.5 MCG/ACT inhaler INHALE TWO PUFFS INTO LUNGS TWICE DAILY 1 Inhaler 11  . clopidogrel (PLAVIX) 75 MG tablet Take 75 mg by mouth daily.    Marland Kitchen HUMALOG 100 UNIT/ML injection Inject 76 units daily with  V-GO pump 70 mL 2  . HYDROcodone-acetaminophen (NORCO/VICODIN) 5-325 MG tablet Take 1-2 tablets by mouth every 6 (six) hours as needed. 30 tablet  0  . lidocaine-prilocaine (EMLA) cream Apply to affected area once 30 g 3  . loratadine (CLARITIN) 10 MG tablet Take 10 mg by mouth daily.    . metoprolol succinate (TOPROL-XL) 100 MG 24 hr tablet TAKE ONE TABLET BY MOUTH TWICE DAILY TAKE  WITH  OR  IMMEDIATELY  FOLLOWING  A  MEAL 180 tablet 2  . ondansetron (ZOFRAN) 8 MG tablet Take 1 tablet (8 mg  total) by mouth 2 (two) times daily as needed for refractory nausea / vomiting. Start on day 3 after chemo. 30 tablet 1  . prochlorperazine (COMPAZINE) 10 MG tablet Take 1 tablet (10 mg total) by mouth every 6 (six) hours as needed (Nausea or vomiting). 30 tablet 1  . rosuvastatin (CRESTOR) 40 MG tablet TAKE ONE TABLET BY MOUTH ONCE DAILY 30 tablet 11  . TraMADol HCl 200 MG CP24 Take 1 tablet by mouth daily. Take every day per patient    . TRULICITY 1.49 FW/2.6VZ SOPN Once a week. On fridays    . ZETIA 10 MG tablet Take 1 tablet by mouth  daily 90 tablet 2   No current facility-administered medications for this visit.     PHYSICAL EXAMINATION: ECOG PERFORMANCE STATUS: 1 - Symptomatic but completely ambulatory  Vitals:   02/17/16 1022  BP: 131/60  Pulse: 78  Resp: 18  Temp: 98.1 F (36.7 C)   Filed Weights   02/17/16 1022  Weight: 198 lb 6.4 oz (90 kg)    GENERAL:alert, no distress and comfortable SKIN: skin color, texture, turgor are normal, no rashes or significant lesions EYES: normal, Conjunctiva are pink and non-injected, sclera clear OROPHARYNX:no exudate, no erythema and lips, buccal mucosa, and tongue normal  NECK: supple, thyroid normal size, non-tender, without nodularity LYMPH:  no palpable lymphadenopathy in the cervical, axillary or inguinal LUNGS: clear to auscultation and percussion with normal breathing effort HEART: regular rate & rhythm and no murmurs and no lower extremity edema ABDOMEN:abdomen soft, non-tender and normal bowel sounds MUSCULOSKELETAL:no cyanosis of digits and no clubbing  NEURO: alert & oriented x 3 with fluent speech, no focal motor/sensory deficits EXTREMITIES: No lower extremity edema  LABORATORY DATA:  I have reviewed the data as listed   Chemistry      Component Value Date/Time   NA 140 02/17/2016 0946   K 4.4 02/17/2016 0946   CL 107 01/11/2016 1447   CO2 27 02/17/2016 0946   BUN 20.2 02/17/2016 0946   CREATININE 1.2 (H)  02/17/2016 0946      Component Value Date/Time   CALCIUM 9.0 02/17/2016 0946   ALKPHOS 56 02/17/2016 0946   AST 28 02/17/2016 0946   ALT 20 02/17/2016 0946   BILITOT 0.67 02/17/2016 0946       Lab Results  Component Value Date   WBC 6.2 02/17/2016   HGB 11.1 (L) 02/17/2016   HCT 33.6 (L) 02/17/2016   MCV 87.9 02/17/2016   PLT 156 02/17/2016   NEUTROABS 3.6 02/17/2016     ASSESSMENT & PLAN:  Breast cancer of upper-inner quadrant of right female breast ([facility]) Right breast biopsy 12:30 position 12/14/2015: 2 masses, 1.6 cm mass: Invasive ductal carcinoma, grade 2, ER 0%, PR 0%, HER-2 negative, Ki-67 70%; satellite mass 8 mm: IDC grade 2 ER 5%, PR 5%, HER-2 negative, Ki-67 50%; T1cN0 stage IA clinical stage  Right lumpectomy 01/18/2016: Multifocal IDC grade 3, 1.9 cm  ER 0%, PR 0%, HER-2 negative, Ki-67 70% and 0.8 cm satellite mass ER 5%, PR 2%,  HER-2 negative, Ki-67 50%, high-grade DCIS, margins negative, 0/1 lymph nodes negative T1 cN0 stage IA  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 Adjuvant chemotherapy with Taxotere and Cytoxan (4 cycles). Adriamycin is not being given because of her cardiac problems 2. Followed by adjuvant radiation 3  followed by adjuvant antiestrogen therapy (because a satellite tumor is weakly ER and PR positive). --------------------------------------------------------------------------------------------------------------------------------------------------------- Current treatment: Cycle 1 Taxotere and Cytoxan Reviewed labs Completed chemotherapy class Reviewed antiemetics regimen We will monitor closely for toxicities  Return to clinic in one week for toxicity check   No orders of the defined types were placed in this encounter.  The patient has a good understanding of the overall plan. she agrees with it. she will call with any problems that may develop before the next  visit here.   Rulon Eisenmenger, MD 02/17/16

## 2016-02-17 NOTE — Assessment & Plan Note (Signed)
Right breast biopsy 12:30 position 12/14/2015: 2 masses, 1.6 cm mass: Invasive ductal carcinoma, grade 2, ER 0%, PR 0%, HER-2 negative, Ki-67 70%; satellite mass 8 mm: IDC grade 2 ER 5%, PR 5%, HER-2 negative, Ki-67 50%; T1cN0 stage IA clinical stage  Right lumpectomy 01/18/2016: Multifocal IDC grade 3, 1.9 cm  ER 0%, PR 0%, HER-2 negative, Ki-67 70% and 0.8 cm satellite mass ER 5%, PR 2%, HER-2 negative, Ki-67 50%, high-grade DCIS, margins negative, 0/1 lymph nodes negative T1 cN0 stage IA  Treatment plan: 1. Adjuvant chemotherapy with Taxotere and Cytoxan (4 cycles). Adriamycin is not being given because of her cardiac problems 2. Followed by adjuvant radiation 3  followed by adjuvant antiestrogen therapy (because a satellite tumor is weakly ER and PR positive). --------------------------------------------------------------------------------------------------------------------------------------------------------- Current treatment: Cycle 1 Taxotere and Cytoxan Reviewed labs Completed chemotherapy class Reviewed antiemetics regimen  Return to clinic in one week for toxicity check

## 2016-02-20 ENCOUNTER — Other Ambulatory Visit: Payer: Self-pay | Admitting: *Deleted

## 2016-02-20 MED ORDER — LORAZEPAM 0.5 MG PO TABS: 0.5000 mg | ORAL_TABLET | Freq: Three times a day (TID) | ORAL | 0 refills | Status: DC | PRN

## 2016-02-20 NOTE — Telephone Encounter (Signed)
  Oncology Nurse Navigator Documentation  Navigator Location: [facility]-Med Onc (02/20/16 1400) Navigator Encounter Type: Telephone (02/20/16 1400)       Surgery Date: 01/18/16 (02/20/16 1400) Treatment Initiated Date: 01/18/16 (02/20/16 1400) Patient Visit Type: MedOnc (02/20/16 1400) Treatment Phase: First Chemo Tx (02/17/16) (02/20/16 1400)                  Acuity: Level 2 (02/20/16 1400)         Time Spent with Patient: 30 (02/20/16 1400)

## 2016-02-21 ENCOUNTER — Telehealth: Payer: Self-pay | Admitting: *Deleted

## 2016-02-21 NOTE — Telephone Encounter (Signed)
Called Hannah [name] Nodal for chemotherapy F/U.  Patient is doing well.  Denies vomiting.  "Saturday evening and Sunday I had to take nausea medicine as ordered.  I feel better today.  I called Navigator yesterday and received an order for a sleeping pill.  This helped stop the dreams racing in my mind so I slept a little."  Denies any new side effects or symptoms.  Bowel and bladder is functioning well.  Eating and drinking well and I instructed to drink 64 oz minimum daily or at least the day before, of and after treatment.  Denies questions at this time and encouraged to call if needed.  Reviewed how to call after hours in the case of an emergency.

## 2016-02-21 NOTE — Telephone Encounter (Signed)
-----   Message from Cathlean Cower, RN sent at 02/17/2016  4:15 PM EDT ----- Regarding: chemo f/u call 1st time Taxotere and Cytoxan on 9/8.  Tolerated well w/o any reaction.

## 2016-02-24 ENCOUNTER — Other Ambulatory Visit: Payer: Self-pay | Admitting: Nurse Practitioner

## 2016-02-24 ENCOUNTER — Ambulatory Visit (HOSPITAL_BASED_OUTPATIENT_CLINIC_OR_DEPARTMENT_OTHER): Payer: Medicare Other | Admitting: Nurse Practitioner

## 2016-02-24 ENCOUNTER — Encounter: Payer: Self-pay | Admitting: Hematology and Oncology

## 2016-02-24 ENCOUNTER — Ambulatory Visit (HOSPITAL_BASED_OUTPATIENT_CLINIC_OR_DEPARTMENT_OTHER): Payer: Medicare Other | Admitting: Hematology and Oncology

## 2016-02-24 ENCOUNTER — Encounter: Payer: Self-pay | Admitting: *Deleted

## 2016-02-24 ENCOUNTER — Ambulatory Visit: Payer: Medicare Other

## 2016-02-24 ENCOUNTER — Other Ambulatory Visit (HOSPITAL_BASED_OUTPATIENT_CLINIC_OR_DEPARTMENT_OTHER): Payer: Medicare Other

## 2016-02-24 VITALS — BP 128/55 | HR 76 | Temp 98.5°F | Resp 16

## 2016-02-24 DIAGNOSIS — C50211 Malignant neoplasm of upper-inner quadrant of right female breast: Secondary | ICD-10-CM | POA: Diagnosis not present

## 2016-02-24 DIAGNOSIS — T451X5A Adverse effect of antineoplastic and immunosuppressive drugs, initial encounter: Secondary | ICD-10-CM | POA: Insufficient documentation

## 2016-02-24 DIAGNOSIS — R39198 Other difficulties with micturition: Secondary | ICD-10-CM | POA: Diagnosis not present

## 2016-02-24 DIAGNOSIS — R109 Unspecified abdominal pain: Secondary | ICD-10-CM | POA: Diagnosis not present

## 2016-02-24 DIAGNOSIS — Z95828 Presence of other vascular implants and grafts: Secondary | ICD-10-CM

## 2016-02-24 DIAGNOSIS — R197 Diarrhea, unspecified: Secondary | ICD-10-CM

## 2016-02-24 DIAGNOSIS — R110 Nausea: Secondary | ICD-10-CM | POA: Diagnosis not present

## 2016-02-24 DIAGNOSIS — N3001 Acute cystitis with hematuria: Secondary | ICD-10-CM

## 2016-02-24 DIAGNOSIS — R112 Nausea with vomiting, unspecified: Secondary | ICD-10-CM

## 2016-02-24 DIAGNOSIS — E8809 Other disorders of plasma-protein metabolism, not elsewhere classified: Secondary | ICD-10-CM | POA: Diagnosis not present

## 2016-02-24 DIAGNOSIS — R5383 Other fatigue: Secondary | ICD-10-CM

## 2016-02-24 HISTORY — DX: Adverse effect of antineoplastic and immunosuppressive drugs, initial encounter: T45.1X5A

## 2016-02-24 HISTORY — DX: Adverse effect of antineoplastic and immunosuppressive drugs, initial encounter: R11.2

## 2016-02-24 LAB — URINALYSIS, MICROSCOPIC - CHCC
Bilirubin (Urine): NEGATIVE
COMMENTS:: 4
Glucose: NEGATIVE mg/dL
Ketones: NEGATIVE mg/dL
Nitrite: POSITIVE
Protein: 30 mg/dL
Specific Gravity, Urine: 1.02 (ref 1.003–1.035)
Urobilinogen, UR: 0.2 mg/dL (ref 0.2–1)
pH: 6 (ref 4.6–8.0)

## 2016-02-24 LAB — COMPREHENSIVE METABOLIC PANEL
ALT: 17 U/L (ref 0–55)
AST: 26 U/L (ref 5–34)
Albumin: 2.6 g/dL — ABNORMAL LOW (ref 3.5–5.0)
Alkaline Phosphatase: 53 U/L (ref 40–150)
Anion Gap: 8 mEq/L (ref 3–11)
BUN: 26.1 mg/dL — ABNORMAL HIGH (ref 7.0–26.0)
CO2: 26 mEq/L (ref 22–29)
Calcium: 9.1 mg/dL (ref 8.4–10.4)
Chloride: 101 mEq/L (ref 98–109)
Creatinine: 1.5 mg/dL — ABNORMAL HIGH (ref 0.6–1.1)
EGFR: 34 mL/min/{1.73_m2} — ABNORMAL LOW (ref >90)
Glucose: 132 mg/dl (ref 70–140)
Potassium: 4.5 mEq/L (ref 3.5–5.1)
Sodium: 135 mEq/L — ABNORMAL LOW (ref 136–145)
Total Bilirubin: 0.93 mg/dL (ref 0.20–1.20)
Total Protein: 6 g/dL — ABNORMAL LOW (ref 6.4–8.3)

## 2016-02-24 LAB — CBC WITH DIFFERENTIAL/PLATELET
BASO%: 0.7 % (ref 0.0–2.0)
Basophils Absolute: 0 10*3/uL (ref 0.0–0.1)
EOS%: 8.4 % — ABNORMAL HIGH (ref 0.0–7.0)
Eosinophils Absolute: 0.3 10*3/uL (ref 0.0–0.5)
HCT: 32.4 % — ABNORMAL LOW (ref 34.8–46.6)
HGB: 10.7 g/dL — ABNORMAL LOW (ref 11.6–15.9)
LYMPH%: 19 % (ref 14.0–49.7)
MCH: 28.9 pg (ref 25.1–34.0)
MCHC: 33.2 g/dL (ref 31.5–36.0)
MCV: 87.2 fL (ref 79.5–101.0)
MONO#: 1.1 10*3/uL — ABNORMAL HIGH (ref 0.1–0.9)
MONO%: 30.5 % — ABNORMAL HIGH (ref 0.0–14.0)
NEUT#: 1.5 10*3/uL (ref 1.5–6.5)
NEUT%: 41.4 % (ref 38.4–76.8)
Platelets: 147 10*3/uL (ref 145–400)
RBC: 3.71 10*6/uL (ref 3.70–5.45)
RDW: 13.1 % (ref 11.2–14.5)
WBC: 3.6 10*3/uL — ABNORMAL LOW (ref 3.9–10.3)
lymph#: 0.7 10*3/uL — ABNORMAL LOW (ref 0.9–3.3)

## 2016-02-24 MED ORDER — PALONOSETRON HCL INJECTION 0.25 MG/5ML
0.2500 mg | Freq: Once | INTRAVENOUS | Status: AC
Start: 2016-02-24 — End: 2016-02-24
  Administered 2016-02-24: 0.25 mg via INTRAVENOUS

## 2016-02-24 MED ORDER — SODIUM CHLORIDE 0.9 % IV SOLN
Freq: Once | INTRAVENOUS | Status: AC
  Administered 2016-02-24: 12:00:00 via INTRAVENOUS

## 2016-02-24 MED ORDER — SODIUM CHLORIDE 0.9 % IJ SOLN
10.0000 mL | INTRAMUSCULAR | Status: DC | PRN
  Administered 2016-02-24: 10 mL
  Filled 2016-02-24: qty 10

## 2016-02-24 MED ORDER — HEPARIN SOD (PORK) LOCK FLUSH 100 UNIT/ML IV SOLN
500.0000 [IU] | Freq: Once | INTRAVENOUS | Status: AC | PRN
  Administered 2016-02-24: 500 [IU]
  Filled 2016-02-24: qty 5

## 2016-02-24 MED ORDER — PALONOSETRON HCL INJECTION 0.25 MG/5ML
INTRAVENOUS | Status: AC
  Filled 2016-02-24: qty 5

## 2016-02-24 MED ORDER — PROCHLORPERAZINE MALEATE 10 MG PO TABS: 10.0000 mg | ORAL_TABLET | Freq: Four times a day (QID) | ORAL | 1 refills | Status: DC | PRN

## 2016-02-24 MED ORDER — SODIUM CHLORIDE 0.9 % IJ SOLN
10.0000 mL | INTRAMUSCULAR | Status: DC | PRN
Start: 2016-02-24 — End: 2016-02-24
  Administered 2016-02-24: 10 mL via INTRAVENOUS
  Filled 2016-02-24: qty 10

## 2016-02-24 MED ORDER — HEPARIN SOD (PORK) LOCK FLUSH 100 UNIT/ML IV SOLN
500.0000 [IU] | Freq: Once | INTRAVENOUS | Status: AC | PRN
  Administered 2016-02-24: 500 [IU] via INTRAVENOUS
  Filled 2016-02-24: qty 5

## 2016-02-24 MED ORDER — CIPROFLOXACIN HCL 500 MG PO TABS: 500.0000 mg | ORAL_TABLET | Freq: Two times a day (BID) | ORAL | 0 refills | Status: DC

## 2016-02-24 NOTE — Progress Notes (Signed)
Patient Care Team: Nicola Girt, DO as PCP - General Alphonsa Overall, MD as Consulting Physician (General Surgery) Nicholas Lose, MD as Consulting Physician (Hematology and Oncology) Gery Pray, MD as Consulting Physician (Radiation Oncology)  DIAGNOSIS: Breast cancer of upper-inner quadrant of right female breast [facility])   Staging form: Breast, AJCC 7th Edition   - Clinical stage from 12/21/2015: Stage IA (T1c, N0, M0) - Unsigned         Staging comments: Staged at breast conference on 7.12.17  SUMMARY OF ONCOLOGIC HISTORY:   Breast cancer of upper-inner quadrant of right female breast ([place])   12/14/2015 Initial Diagnosis    Right breast biopsy 12:30 position: 2 masses, 1.6 cm mass: Invasive ductal carcinoma, grade 2, ER 0%, PR 0%, HER-2 negative, Ki-67 70%; satellite mass 8 mm: IDC grade 2 ER 5%, PR 5%, HER-2 negative, Ki-67 50%; T1cN0 stage IA clinical stage      01/18/2016 Surgery    Right lumpectomy: Multifocal IDC grade 3, 1.9 cm  ER 0%, PR 0%, HER-2 negative, Ki-67 70% and 0.8 cm satellite mass ER 5%, PR 2%, HER-2 negative, Ki-67 50%, high-grade DCIS, margins negative, 0/1 lymph nodes negative T1 cN0 stage IA      02/17/2016 -  Anti-estrogen oral therapy    Adjuvant chemotherapy with Taxotere and Cytoxan4       CHIEF COMPLIANT: Cycle 1 day 8 Taxotere and Cytoxan toxicity check  INTERVAL HISTORY: Hannah [name] is a 70 year old with above-mentioned history of right breast cancer treated with lumpectomy and is currently on adjuvant chemotherapy with Taxotere and Cytoxan. Today is cycle 1 day 8. Patient reports that she has had nausea starting take 2 or 3. Compazine has been helping her better than Zofran. She also noticed diarrhea for the last 2 days and she appears to be dehydrated today. She is extremely fatigued and appears to be sleeping a lot. She also has abdominal pain. Diarrhea. She has noticed burning with urination.  REVIEW OF SYSTEMS:   Constitutional: Denies fevers,  chills or abnormal weight loss, severe fatigue and weakness Eyes: Denies blurriness of vision Ears, nose, mouth, throat, and face: Denies mucositis or sore throat Respiratory: Denies cough, dyspnea or wheezes Cardiovascular: Denies palpitation, chest discomfort Gastrointestinal:  Intermittent abdominal pain Skin: Denies abnormal skin rashes Lymphatics: Denies new lymphadenopathy or easy bruising Neurological:Denies numbness, tingling or new weaknesses Behavioral/Psych: Mood is stable, no new changes  Extremities: No lower extremity edema  All other systems were reviewed with the patient and are negative.  I have reviewed the past medical history, past surgical history, social history and family history with the patient and they are unchanged from previous note.  ALLERGIES:  is allergic to imdur [isosorbide dinitrate]; amoxicillin; and tetracycline.  MEDICATIONS:  Current Outpatient Prescriptions  Medication Sig Dispense Refill  . ACCU-CHEK AVIVA PLUS test strip     . alendronate (FOSAMAX) 70 MG tablet Take 70 mg by mouth once a week. Take with a full glass of water on an empty stomach.    Marland Kitchen aspirin EC 81 MG EC tablet Take 1 tablet (81 mg total) by mouth daily.    . budesonide-formoterol (SYMBICORT) 160-4.5 MCG/ACT inhaler INHALE TWO PUFFS INTO LUNGS TWICE DAILY 1 Inhaler 11  . clopidogrel (PLAVIX) 75 MG tablet Take 75 mg by mouth daily.    Marland Kitchen HUMALOG 100 UNIT/ML injection Inject 76 units daily with  V-GO pump 70 mL 2  . HYDROcodone-acetaminophen (NORCO/VICODIN) 5-325 MG tablet Take 1-2 tablets by mouth every  6 (six) hours as needed. 30 tablet 0  . lidocaine-prilocaine (EMLA) cream Apply to affected area once 30 g 3  . loratadine (CLARITIN) 10 MG tablet Take 10 mg by mouth daily.    Marland Kitchen LORazepam (ATIVAN) 0.5 MG tablet Take 1 tablet (0.5 mg total) by mouth every 8 (eight) hours as needed for anxiety. 30 tablet 0  . metoprolol succinate (TOPROL-XL) 100 MG 24 hr tablet TAKE ONE TABLET BY  MOUTH TWICE DAILY TAKE  WITH  OR  IMMEDIATELY  FOLLOWING  A  MEAL 180 tablet 2  . ondansetron (ZOFRAN) 8 MG tablet Take 1 tablet (8 mg total) by mouth 2 (two) times daily as needed for refractory nausea / vomiting. Start on day 3 after chemo. 30 tablet 1  . prochlorperazine (COMPAZINE) 10 MG tablet Take 1 tablet (10 mg total) by mouth every 6 (six) hours as needed (Nausea or vomiting). 60 tablet 1  . rosuvastatin (CRESTOR) 40 MG tablet TAKE ONE TABLET BY MOUTH ONCE DAILY 30 tablet 11  . TraMADol HCl 200 MG CP24 Take 1 tablet by mouth daily. Take every day per patient    . TRULICITY 5.85 ID/7.8EU SOPN Once a week. On fridays    . ZETIA 10 MG tablet Take 1 tablet by mouth  daily 90 tablet 2   No current facility-administered medications for this visit.     PHYSICAL EXAMINATION: ECOG PERFORMANCE STATUS: 2 - Symptomatic, <50% confined to bed  Vitals:   02/24/16 1046  BP: (!) 114/39  Pulse: 84  Resp: 14  Temp: 98.3 F (36.8 C)   Filed Weights   02/24/16 1046  Weight: 192 lb 3.2 oz (87.2 kg)    GENERAL:alert, no distress and comfortable SKIN: skin color, texture, turgor are normal, no rashes or significant lesions EYES: normal, Conjunctiva are pink and non-injected, sclera clear OROPHARYNX:no exudate, no erythema and lips, buccal mucosa, and tongue normal  NECK: supple, thyroid normal size, non-tender, without nodularity LYMPH:  no palpable lymphadenopathy in the cervical, axillary or inguinal LUNGS: clear to auscultation and percussion with normal breathing effort HEART: regular rate & rhythm and no murmurs and no lower extremity edema ABDOMEN:abdomen soft, non-tender and normal bowel sounds MUSCULOSKELETAL:no cyanosis of digits and no clubbing  NEURO: alert & oriented x 3 with fluent speech, no focal motor/sensory deficits EXTREMITIES: No lower extremity edema  LABORATORY DATA:  I have reviewed the data as listed   Chemistry      Component Value Date/Time   NA 135 (L)  02/24/2016 0948   K 4.5 02/24/2016 0948   CL 107 01/11/2016 1447   CO2 26 02/24/2016 0948   BUN 26.1 (H) 02/24/2016 0948   CREATININE 1.5 (H) 02/24/2016 0948      Component Value Date/Time   CALCIUM 9.1 02/24/2016 0948   ALKPHOS 53 02/24/2016 0948   AST 26 02/24/2016 0948   ALT 17 02/24/2016 0948   BILITOT 0.93 02/24/2016 0948       Lab Results  Component Value Date   WBC 3.6 (L) 02/24/2016   HGB 10.7 (L) 02/24/2016   HCT 32.4 (L) 02/24/2016   MCV 87.2 02/24/2016   PLT 147 02/24/2016   NEUTROABS 1.5 02/24/2016     ASSESSMENT & PLAN:  Breast cancer of upper-inner quadrant of right female breast ([place]) Right breast biopsy 12:30 position 12/14/2015: 2 masses, 1.6 cm mass: Invasive ductal carcinoma, grade 2, ER 0%, PR 0%, HER-2 negative, Ki-67 70%; satellite mass 8 mm: IDC grade 2 ER 5%, PR  5%, HER-2 negative, Ki-67 50%; T1cN0 stage IA clinical stage  Right lumpectomy 01/18/2016: Multifocal IDC grade 3, 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 Adjuvant chemotherapy with Taxotere and Cytoxan (4 cycles). Adriamycin is not being given because of her cardiac problems 2. Followed by adjuvant radiation 3 followed by adjuvant antiestrogen therapy (because a satellite tumor is weakly ER and PR positive). --------------------------------------------------------------------------------------------------------------------------------------------------------- Current treatment: Cycle 1 day 8 Taxotere and Cytoxan  Chemotherapy toxicities: 1. Nausea and vomiting: I will decrease the dosage of cycle 2 of chemotherapy 2. Dehydration due to diarrhea: We will give her 1 L of normal saline. We will also add Aloxi. 3. Severe fatigue:  Due to dehydration. 4. Hypoalbuminemia: I encouraged her to eat more protein in the diet.   Reviewed labs  Monitoring closely for toxicities  Return to clinic in2 weeks for cycle 2   Orders Placed This Encounter  Procedures  . UA with Microscopic    Standing Status:   Future    Standing Expiration Date:   02/23/2017   The patient has a good understanding of the overall plan. she agrees with it. she will call with any problems that may develop before the next visit here.   Rulon Eisenmenger, MD 02/24/16

## 2016-02-24 NOTE — Patient Instructions (Signed)

## 2016-02-24 NOTE — Patient Instructions (Signed)
Dehydration, Adult Dehydration is a condition in which you do not have enough fluid or water in your body. It happens when you take in less fluid than you lose. Vital organs such as the kidneys, brain, and heart cannot function without a proper amount of fluids. Any loss of fluids from the body can cause dehydration.  Dehydration can range from mild to severe. This condition should be treated right away to help prevent it from becoming severe. CAUSES  This condition may be caused by:  Vomiting.  Diarrhea.  Excessive sweating, such as when exercising in hot or humid weather.  Not drinking enough fluid during strenuous exercise or during an illness.  Excessive urine output.  Fever.  Certain medicines. RISK FACTORS This condition is more likely to develop in:  People who are taking certain medicines that cause the body to lose excess fluid (diuretics).   People who have a chronic illness, such as diabetes, that may increase urination.  Older adults.   People who live at high altitudes.   People who participate in endurance sports.  SYMPTOMS  Mild Dehydration  Thirst.  Dry lips.  Slightly dry mouth.  Dry, warm skin. Moderate Dehydration  Very dry mouth.   Muscle cramps.   Dark urine and decreased urine production.   Decreased tear production.   Headache.   Light-headedness, especially when you stand up from a sitting position.  Severe Dehydration  Changes in skin.   Cold and clammy skin.   Skin does not spring back quickly when lightly pinched and released.   Changes in body fluids.   Extreme thirst.   No tears.   Not able to sweat when body temperature is high, such as in hot weather.   Minimal urine production.   Changes in vital signs.   Rapid, weak pulse (more than 100 beats per minute when you are sitting still).   Rapid breathing.   Low blood pressure.   Other changes.   Sunken eyes.   Cold hands and feet.    Confusion.  Lethargy and difficulty being awakened.  Fainting (syncope).   Short-term weight loss.   Unconsciousness. DIAGNOSIS  This condition may be diagnosed based on your symptoms. You may also have tests to determine how severe your dehydration is. These tests may include:   Urine tests.   Blood tests.  TREATMENT  Treatment for this condition depends on the severity. Mild or moderate dehydration can often be treated at home. Treatment should be started right away. Do not wait until dehydration becomes severe. Severe dehydration needs to be treated at the hospital. Treatment for Mild Dehydration  Drinking plenty of water to replace the fluid you have lost.   Replacing minerals in your blood (electrolytes) that you may have lost.  Treatment for Moderate Dehydration  Consuming oral rehydration solution (ORS). Treatment for Severe Dehydration  Receiving fluid through an IV tube.   Receiving electrolyte solution through a feeding tube that is passed through your nose and into your stomach (nasogastric tube or NG tube).  Correcting any abnormalities in electrolytes. HOME CARE INSTRUCTIONS   Drink enough fluid to keep your urine clear or pale yellow.   Drink water or fluid slowly by taking small sips. You can also try sucking on ice cubes.  Have food or beverages that contain electrolytes. Examples include bananas and sports drinks.  Take over-the-counter and prescription medicines only as told by your health care provider.   Prepare ORS according to the manufacturer's instructions. Take sips   of ORS every 5 minutes until your urine returns to normal.  If you have vomiting or diarrhea, continue to try to drink water, ORS, or both.   If you have diarrhea, avoid:   Beverages that contain caffeine.   Fruit juice.   Milk.   Carbonated soft drinks.  Do not take salt tablets. This can lead to the condition of having too much sodium in your body  (hypernatremia).  SEEK MEDICAL CARE IF:  You cannot eat or drink without vomiting.  You have had moderate diarrhea during a period of more than 24 hours.  You have a fever. SEEK IMMEDIATE MEDICAL CARE IF:   You have extreme thirst.  You have severe diarrhea.  You have not urinated in 6-8 hours, or you have urinated only a small amount of very dark urine.  You have shriveled skin.  You are dizzy, confused, or both.   This information is not intended to replace advice given to you by your health care provider. Make sure you discuss any questions you have with your health care provider.   Document Released: 05/28/2005 Document Revised: 02/16/2015 Document Reviewed: 10/13/2014 Elsevier Interactive Patient Education 2016 [employer]  

## 2016-02-24 NOTE — Progress Notes (Signed)
U/A positive for UIT. Dr. Lindi Adie gone for the day. Reviewed with Hannah Lesser, NP in [facility]. Received order for Cipro  500 mg BID for 7 days. This wa escribed to pt's pharamcy ([facility] on [address]) Instructions for medication reviewed with pt. She voiced understanding to take all meds as ordered, increase fluid intake and to call back if not feeling beter in next 24-48 hrs. Pt states she feels better after receiving IVFs today.

## 2016-02-24 NOTE — Assessment & Plan Note (Signed)
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 Adjuvant chemotherapy with Taxotere and Cytoxan (4 cycles). Adriamycin is not being given because of her cardiac problems 2. Followed by adjuvant radiation 3 followed by adjuvant antiestrogen therapy (because a satellite tumor is weakly ER and PR positive). --------------------------------------------------------------------------------------------------------------------------------------------------------- Current treatment: Cycle 1 day 8 Taxotere and Cytoxan  Chemotherapy toxicities: 1. Nausea and vomiting: I will decrease the dosage of cycle 2 of chemotherapy 2. Dehydration due to diarrhea: We will give her 1 L of normal saline. We will also add Aloxi. 3. Severe fatigue: Due to dehydration. 4. Hypoalbuminemia: I encouraged her to eat more protein in the diet.   Reviewed labs  Monitoring closely for toxicities  Return to clinic in2 weeks for cycle 2

## 2016-02-28 ENCOUNTER — Ambulatory Visit (HOSPITAL_BASED_OUTPATIENT_CLINIC_OR_DEPARTMENT_OTHER): Payer: Medicare Other | Admitting: Nurse Practitioner

## 2016-02-28 ENCOUNTER — Telehealth: Payer: Self-pay | Admitting: *Deleted

## 2016-02-28 VITALS — BP 99/47 | HR 75 | Temp 98.6°F | Resp 18 | Ht 64.0 in | Wt 193.9 lb

## 2016-02-28 DIAGNOSIS — K2100 Gastro-esophageal reflux disease with esophagitis, without bleeding: Secondary | ICD-10-CM

## 2016-02-28 DIAGNOSIS — E860 Dehydration: Secondary | ICD-10-CM | POA: Diagnosis not present

## 2016-02-28 DIAGNOSIS — C50211 Malignant neoplasm of upper-inner quadrant of right female breast: Secondary | ICD-10-CM | POA: Diagnosis not present

## 2016-02-28 DIAGNOSIS — K219 Gastro-esophageal reflux disease without esophagitis: Secondary | ICD-10-CM | POA: Diagnosis not present

## 2016-02-28 MED ORDER — SUCRALFATE 1 GM/10ML PO SUSP: 1.0000 g | Freq: Three times a day (TID) | ORAL | 1 refills | Status: DC

## 2016-02-28 MED ORDER — SODIUM CHLORIDE 0.9 % IV SOLN
1000.0000 mL | INTRAVENOUS | Status: AC
  Administered 2016-02-28: 1000 mL via INTRAVENOUS

## 2016-02-28 MED ORDER — HEPARIN SOD (PORK) LOCK FLUSH 100 UNIT/ML IV SOLN
500.0000 [IU] | Freq: Once | INTRAVENOUS | Status: AC
  Administered 2016-02-28: 500 [IU] via INTRAVENOUS
  Filled 2016-02-28: qty 5

## 2016-02-28 MED ORDER — SODIUM CHLORIDE 0.9% FLUSH
10.0000 mL | Freq: Once | INTRAVENOUS | Status: AC
  Administered 2016-02-28: 10 mL via INTRAVENOUS
  Filled 2016-02-28: qty 10

## 2016-02-28 NOTE — Patient Instructions (Signed)
Dehydration, Adult Dehydration is a condition in which you do not have enough fluid or water in your body. It happens when you take in less fluid than you lose. Vital organs such as the kidneys, brain, and heart cannot function without a proper amount of fluids. Any loss of fluids from the body can cause dehydration.  Dehydration can range from mild to severe. This condition should be treated right away to help prevent it from becoming severe. CAUSES  This condition may be caused by:  Vomiting.  Diarrhea.  Excessive sweating, such as when exercising in hot or humid weather.  Not drinking enough fluid during strenuous exercise or during an illness.  Excessive urine output.  Fever.  Certain medicines. RISK FACTORS This condition is more likely to develop in:  People who are taking certain medicines that cause the body to lose excess fluid (diuretics).   People who have a chronic illness, such as diabetes, that may increase urination.  Older adults.   People who live at high altitudes.   People who participate in endurance sports.  SYMPTOMS  Mild Dehydration  Thirst.  Dry lips.  Slightly dry mouth.  Dry, warm skin. Moderate Dehydration  Very dry mouth.   Muscle cramps.   Dark urine and decreased urine production.   Decreased tear production.   Headache.   Light-headedness, especially when you stand up from a sitting position.  Severe Dehydration  Changes in skin.   Cold and clammy skin.   Skin does not spring back quickly when lightly pinched and released.   Changes in body fluids.   Extreme thirst.   No tears.   Not able to sweat when body temperature is high, such as in hot weather.   Minimal urine production.   Changes in vital signs.   Rapid, weak pulse (more than 100 beats per minute when you are sitting still).   Rapid breathing.   Low blood pressure.   Other changes.   Sunken eyes.   Cold hands and feet.    Confusion.  Lethargy and difficulty being awakened.  Fainting (syncope).   Short-term weight loss.   Unconsciousness. DIAGNOSIS  This condition may be diagnosed based on your symptoms. You may also have tests to determine how severe your dehydration is. These tests may include:   Urine tests.   Blood tests.  TREATMENT  Treatment for this condition depends on the severity. Mild or moderate dehydration can often be treated at home. Treatment should be started right away. Do not wait until dehydration becomes severe. Severe dehydration needs to be treated at the hospital. Treatment for Mild Dehydration  Drinking plenty of water to replace the fluid you have lost.   Replacing minerals in your blood (electrolytes) that you may have lost.  Treatment for Moderate Dehydration  Consuming oral rehydration solution (ORS). Treatment for Severe Dehydration  Receiving fluid through an IV tube.   Receiving electrolyte solution through a feeding tube that is passed through your nose and into your stomach (nasogastric tube or NG tube).  Correcting any abnormalities in electrolytes. HOME CARE INSTRUCTIONS   Drink enough fluid to keep your urine clear or pale yellow.   Drink water or fluid slowly by taking small sips. You can also try sucking on ice cubes.  Have food or beverages that contain electrolytes. Examples include bananas and sports drinks.  Take over-the-counter and prescription medicines only as told by your health care provider.   Prepare ORS according to the manufacturer's instructions. Take sips   of ORS every 5 minutes until your urine returns to normal.  If you have vomiting or diarrhea, continue to try to drink water, ORS, or both.   If you have diarrhea, avoid:   Beverages that contain caffeine.   Fruit juice.   Milk.   Carbonated soft drinks.  Do not take salt tablets. This can lead to the condition of having too much sodium in your body  (hypernatremia).  SEEK MEDICAL CARE IF:  You cannot eat or drink without vomiting.  You have had moderate diarrhea during a period of more than 24 hours.  You have a fever. SEEK IMMEDIATE MEDICAL CARE IF:   You have extreme thirst.  You have severe diarrhea.  You have not urinated in 6-8 hours, or you have urinated only a small amount of very dark urine.  You have shriveled skin.  You are dizzy, confused, or both.   This information is not intended to replace advice given to you by your health care provider. Make sure you discuss any questions you have with your health care provider.   Document Released: 05/28/2005 Document Revised: 02/16/2015 Document Reviewed: 10/13/2014 Elsevier Interactive Patient Education 2016 [employer]  

## 2016-02-28 NOTE — Telephone Encounter (Signed)
Pt called with c/o difficulty swallowing her food. Relate "feels like my throat is closing a little and I have to take very small bites". Pt not having SOB or difficulty breathing. Discussed with Dr. Lindi Adie. Request pt to be seen by symptom management clinic today. Called pt and discussed recommendations. Pt to arrive at 2:30. Left vm for symptom management nurse with description of pt complaints.

## 2016-02-29 ENCOUNTER — Encounter: Payer: Self-pay | Admitting: Nurse Practitioner

## 2016-02-29 DIAGNOSIS — E860 Dehydration: Secondary | ICD-10-CM | POA: Insufficient documentation

## 2016-02-29 DIAGNOSIS — K219 Gastro-esophageal reflux disease without esophagitis: Secondary | ICD-10-CM | POA: Insufficient documentation

## 2016-02-29 NOTE — Assessment & Plan Note (Signed)
Patient received her first cycle of Taxotere/Cytoxan on 02/17/2016.  She is scheduled to return for labs, flush, visit, chemotherapy on 03/09/2016.

## 2016-02-29 NOTE — Assessment & Plan Note (Signed)
Patient states that she has noticed some significant heartburn/reflux with some burning in the back as well.  Since her cycle of chemotherapy on 02/17/2016.  She denies any oral lesions in the colon but states that her tongue burns.  She has tried no over-the-counter medications.  Exam today reveals patient's tongue is red; and mildly sensitive; but no oral lesions noted on exam.  Posterior oropharynx clear with no erythema or exudate.  Long discussion with the patient and her husband.  Patient was advised that she is most likely suffering from some GERD.  She was advised to try Prilosec over-the-counter as directed.  Also prescribed Carafate to see this helps.  Also offered Magic mouthwash with lidocaine; but patient refused.  Patient was also encouraged to eat more bland foods to see if this helps as well.  She was advised to call/return or go directly to the emergency department for worsening symptoms whatsoever.

## 2016-02-29 NOTE — Assessment & Plan Note (Signed)
Patient states that she has been limiting her oral intake due to the reflux issues.  She feels mildly dehydrated today; and will give IV fluid rehydration.  She was also encouraged to push fluids.

## 2016-02-29 NOTE — Progress Notes (Signed)
SYMPTOM MANAGEMENT CLINIC    Chief Complaint: GERD, dehydration  HPI:  Hannah [name] 70 y.o. female diagnosed with breast cancer.  Currently undergoing Taxotere/Cytoxan chemotherapy regimen.     Breast cancer of upper-inner quadrant of right female breast ([place])   12/14/2015 Initial Diagnosis    Right breast biopsy 12:30 position: 2 masses, 1.6 cm mass: Invasive ductal carcinoma, grade 2, ER 0%, PR 0%, HER-2 negative, Ki-67 70%; satellite mass 8 mm: IDC grade 2 ER 5%, PR 5%, HER-2 negative, Ki-67 50%; T1cN0 stage IA clinical stage      01/18/2016 Surgery    Right lumpectomy: Multifocal IDC grade 3, 1.9 cm  ER 0%, PR 0%, HER-2 negative, Ki-67 70% and 0.8 cm satellite mass ER 5%, PR 2%, HER-2 negative, Ki-67 50%, high-grade DCIS, margins negative, 0/1 lymph nodes negative T1 cN0 stage IA      02/17/2016 -  Anti-estrogen oral therapy    Adjuvant chemotherapy with Taxotere and Cytoxan4       Review of Systems  Gastrointestinal: Positive for heartburn and nausea.  All other systems reviewed and are negative.   Past Medical History:  Diagnosis Date  . Abnormally small mouth   . Anemia   . Bilateral carotid artery stenosis    Bilateral ICA 40-59%/  >50% LECA  . Breast cancer ([place])   . Breast cancer of upper-inner quadrant of right female breast ([place]) 12/16/2015  . Bruises easily   . CAD (coronary artery disease) cardiologist-  dr Aundra Dubin   Remote CABG in 1998 with redo in 2009  . CHF (congestive heart failure) (HCC)    EF is low normal at 50 to 55% per echo in Jan. 2011  . Dyspnea   . GERD (gastroesophageal reflux disease)   . Hearing loss of right ear   . History of non-ST elevation myocardial infarction (NSTEMI)    Sept 2014--  thought to be type II HTN w/ LHC without infarct related artery and patent grafts  . Hyperlipidemia   . Hypertension   . Iron deficiency anemia   . Moderate persistent asthma    pulmologist-  Dr. Malvin Johns  . Nausea & vomiting    related to stone  .  Nonischemic cardiomyopathy ([place])   . PAF (paroxysmal atrial fibrillation) ([place])   . PONV (postoperative nausea and vomiting)    pt states patch worked well before  . Psoriasis    right leg  . Renal calculus, right   . Renal insufficiency, mild   . S/P AVR    prosthesis valve placement 2009 at same time re-do CABG  . 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 Type 2 diabetes mellitus ([place])    monitored by dr Dwyane Dee    Past Surgical History:  Procedure Laterality Date  . AORTIC VALVE REPLACEMENT  2009   [facility]done same time is CABG)  . BREAST LUMPECTOMY WITH RADIOACTIVE SEED AND SENTINEL LYMPH NODE BIOPSY Right 01/18/2016   Procedure: RIGHT BREAST LUMPECTOMY WITH RADIOACTIVE SEED AND SENTINEL LYMPH NODE BIOPSY;  Surgeon: DAlphonsa Overall MD;  [facility]  Service: General;  Laterality: Right;  . CARDIAC CATHETERIZATION  03/23/2008   Pre-redo CABG: L main OK, LAD (T), CFX (T), OM1 99%, RCA (T), LIMA-LAD OK, SVG-OM(?3) OK w/ little florw to OM2, SVG-RCA OK. EF NL  . CARDIOVASCULAR STRESS TEST  06-15-2013      dr hDebara Pickett  [place]fusion study/  no evidence ischemia/  normal wall motion, ef 72%  . CARPAL TUNNEL RELEASE    . COLONOSCOPY    . CORONARY ARTERY BYPASS GRAFT  1998 &  re-do 2009   Had LIMA to DX/LAD, SVG to 2 marginal branches and SVG to [facility] originally; SVG to 3rd OM and PD at time of redo  . CYSTOSCOPY W/ URETERAL STENT PLACEMENT Right 12/20/2014   Procedure: CYSTOSCOPY WITH RETROGRADE PYELOGRAM/URETERAL STENT PLACEMENT;  Surgeon: Cleon Gustin, MD;  Location[facility];  Service: Urology;  Laterality: Right;  . EYE SURGERY Bilateral    cataracts  . HOLMIUM LASER APPLICATION Right 11/10/930   Procedure:  HOLMIUM LASER LITHOTRIPSY;  Surgeon: Cleon Gustin, MD;  Location[facility];  Service: Urology;  Laterality: Right;  . LEFT HEART CATHETERIZATION WITH CORONARY/GRAFT ANGIOGRAM N/A 02/23/2013    Procedure: LEFT HEART CATHETERIZATION WITH Beatrix Fetters;  Surgeon: Blane Ohara, MD;  Location: [facility] CATH LAB;  Service: Cardiovascular;  Laterality: N/A;  . PORTACATH PLACEMENT Left 01/18/2016   Procedure: INSERTION PORT-A-CATH;  Surgeon: Alphonsa Overall, MD;  Location: [place];  Service: General;  Laterality: Left;  . TONSILLECTOMY    . TRANSTHORACIC ECHOCARDIOGRAM  02-24-2013      mild LVH,  ef 50-55%/  AV bioprosthesis was present with very mild stenosis and no regurg., mean grandient 30mHg, peak grandient 264mg /  mild MR/  mild LAE and RAE/  moderate TR  . TUBAL LIGATION      has HYPERLIPIDEMIA; HYPERTENSION; Atrial fibrillation ([facility] Allergic rhinitis; Asthma; DYSPNEA; COUGH; Hypertension; Hyperlipidemia; SOB (shortness of breath); CHF (congestive heart failure) ([place] Renal insufficiency, mild; Dyspnea; Nonischemic cardiomyopathy (HCL[place] Anemia; Diabetes mellitus, type 2 ([place] Iron deficiency anemia; S/P aortic valve replacement; CAD (coronary artery disease); Non-ST elevation myocardial infarction (NSTEMI), [facility] of [facility] ([facility] Atrial [facility]cardia ([facility] Unstable angina ([facility] Chest pain; Acute on chronic combined systolic and diastolic CHF, NYHA class 3 ([place] CKD (chronic kidney disease); Chronic diastolic CHF (congestive heart failure) ([place] Acute bronchitis; Breast cancer of upper-inner quadrant of right female breast ([place] catheter in place; Chemotherapy induced nausea and vomiting; Dehydration; and GERD (gastroesophageal reflux disease) on her problem list.    is allergic to imdur [isosorbide dinitrate]; amoxicillin; and tetracycline.    Medication List       Accurate as of 02/28/16 11:59 PM. Always use your most recent med list.          ACCU-CHEK AVIVA PLUS test strip Generic drug:  glucose blood   alendronate 70 MG tablet Commonly known as:  FOSAMAX Take 70 mg by mouth once a week. Take with a full glass of water on an empty stomach.   aspirin  81 MG EC tablet Take 1 tablet (81 mg total) by mouth daily.   budesonide-formoterol 160-4.5 MCG/ACT inhaler Commonly known as:  SYMBICORT INHALE TWO PUFFS INTO LUNGS TWICE DAILY   ciprofloxacin 500 MG tablet Commonly known as:  CIPRO Take 1 tablet (500 mg total) by mouth 2 (two) times daily.   clopidogrel 75 MG tablet Commonly known as:  PLAVIX Take 75 mg by mouth daily.   HUMALOG 100 UNIT/ML injection Generic drug:  insulin lispro Inject 76 units daily with  V-GO pump   HYDROcodone-acetaminophen 5-325 MG tablet Commonly known as:  NORCO/VICODIN Take 1-2 tablets by mouth every 6 (six) hours as needed.   lidocaine-prilocaine cream Commonly known as:  EMLA Apply to affected area once   loratadine 10 MG tablet Commonly known as:  CLARITIN Take  10 mg by mouth daily.   LORazepam 0.5 MG tablet Commonly known as:  ATIVAN Take 1 tablet (0.5 mg total) by mouth every 8 (eight) hours as needed for anxiety.   metoprolol succinate 100 MG 24 hr tablet Commonly known as:  TOPROL-XL TAKE ONE TABLET BY MOUTH TWICE DAILY TAKE  WITH  OR  IMMEDIATELY  FOLLOWING  A  MEAL   ondansetron 8 MG tablet Commonly known as:  ZOFRAN Take 1 tablet (8 mg total) by mouth 2 (two) times daily as needed for refractory nausea / vomiting. Start on day 3 after chemo.   prochlorperazine 10 MG tablet Commonly known as:  COMPAZINE Take 1 tablet (10 mg total) by mouth every 6 (six) hours as needed (Nausea or vomiting).   rosuvastatin 40 MG tablet Commonly known as:  CRESTOR TAKE ONE TABLET BY MOUTH ONCE DAILY   sucralfate 1 GM/10ML suspension Commonly known as:  CARAFATE Take 10 mLs (1 g total) by mouth 4 (four) times daily -  with meals and at bedtime.   TraMADol HCl 200 MG Cp24 Take 1 tablet by mouth daily. Take every day per patient   TRULICITY 4.58 KD/9.8PJ Sopn Generic drug:  Dulaglutide Once a week. On fridays   ZETIA 10 MG tablet Generic drug:  ezetimibe Take 1 tablet by mouth  daily          PHYSICAL EXAMINATION  Oncology Vitals 02/28/2016 02/24/2016  Height 163 cm -  Weight 87.952 kg -  Weight (lbs) 193 lbs 14 oz -  BMI (kg/m2) 33.28 kg/m2 -  Temp 98.6 98.5  Pulse 75 76  Resp 18 16  SpO2 96 100  BSA (m2) 1.99 m2 -   BP Readings from Last 2 Encounters:  02/28/16 (!) 99/47  02/24/16 (!) 128/55    Physical Exam  Constitutional: She is oriented to person, place, and time and well-developed, well-nourished, and in no distress.  HENT:  Head: Normocephalic and atraumatic.  Mouth/Throat: Oropharynx is clear and moist.  Patient's tongue appears irritated and red; no obvious oral lesions.  Eyes: Conjunctivae and EOM are normal. Pupils are equal, round, and reactive to light. Right eye exhibits no discharge. Left eye exhibits no discharge. No scleral icterus.  Neck: Normal range of motion. Neck supple. No JVD present. No tracheal deviation present. No thyromegaly present.  Cardiovascular: Normal rate, regular rhythm, normal heart sounds and intact distal pulses.   Pulmonary/Chest: Effort normal and breath sounds normal. No respiratory distress. She has no wheezes. She has no rales. She exhibits no tenderness.  Abdominal: Soft. Bowel sounds are normal. She exhibits no distension and no mass. There is no tenderness. There is no rebound and no guarding.  Musculoskeletal: Normal range of motion. She exhibits no edema or tenderness.  Lymphadenopathy:    She has no cervical adenopathy.  Neurological: She is alert and oriented to person, place, and time. Gait normal.  Skin: Skin is warm and dry. No rash noted. No erythema. No pallor.  Psychiatric: Affect normal.  Nursing note and vitals reviewed.   LABORATORY DATA:. No visits with results within 3 Day(s) from this visit.  Latest known visit with results is:  Appointment on 02/24/2016  Component Date Value Ref Range Status  . WBC 02/24/2016 3.6* 3.9 - 10.3 10e3/uL Final  . NEUT# 02/24/2016 1.5  1.5 - 6.5 10e3/uL Final  .  HGB 02/24/2016 10.7* 11.6 - 15.9 g/dL Final  . HCT 02/24/2016 32.4* 34.8 - 46.6 % Final  . Platelets 02/24/2016 147  145 - 400 10e3/uL Final  . MCV 02/24/2016 87.2  79.5 - 101.0 fL Final  . MCH 02/24/2016 28.9  25.1 - 34.0 pg Final  . MCHC 02/24/2016 33.2  31.5 - 36.0 g/dL Final  . RBC 02/24/2016 3.71  3.70 - 5.45 10e6/uL Final  . RDW 02/24/2016 13.1  11.2 - 14.5 % Final  . lymph# 02/24/2016 0.7* 0.9 - 3.3 10e3/uL Final  . MONO# 02/24/2016 1.1* 0.1 - 0.9 10e3/uL Final  . Eosinophils Absolute 02/24/2016 0.3  0.0 - 0.5 10e3/uL Final  . Basophils Absolute 02/24/2016 0.0  0.0 - 0.1 10e3/uL Final  . NEUT% 02/24/2016 41.4  38.4 - 76.8 % Final  . LYMPH% 02/24/2016 19.0  14.0 - 49.7 % Final  . MONO% 02/24/2016 30.5* 0.0 - 14.0 % Final  . EOS% 02/24/2016 8.4* 0.0 - 7.0 % Final  . BASO% 02/24/2016 0.7  0.0 - 2.0 % Final  . Sodium 02/24/2016 135* 136 - 145 mEq/L Final  . Potassium 02/24/2016 4.5  3.5 - 5.1 mEq/L Final  . Chloride 02/24/2016 101  98 - 109 mEq/L Final  . CO2 02/24/2016 26  22 - 29 mEq/L Final  . Glucose 02/24/2016 132  70 - 140 mg/dl Final  . BUN 02/24/2016 26.1* 7.0 - 26.0 mg/dL Final  . Creatinine 02/24/2016 1.5* 0.6 - 1.1 mg/dL Final  . Total Bilirubin 02/24/2016 0.93  0.20 - 1.20 mg/dL Final  . Alkaline Phosphatase 02/24/2016 53  40 - 150 U/L Final  . AST 02/24/2016 26  5 - 34 U/L Final  . ALT 02/24/2016 17  0 - 55 U/L Final  . Total Protein 02/24/2016 6.0* 6.4 - 8.3 g/dL Final  . Albumin 02/24/2016 2.6* 3.5 - 5.0 g/dL Final  . Calcium 02/24/2016 9.1  8.4 - 10.4 mg/dL Final  . Anion Gap 02/24/2016 8  3 - 11 mEq/L Final  . EGFR 02/24/2016 34* >90 ml/min/1.73 m2 Final  . Glucose 02/24/2016 Negative  Negative mg/dL Final  . Bilirubin (Urine) 02/24/2016 Negative  Negative Final  . Ketones 02/24/2016 Negative  Negative mg/dL Final  . Specific Gravity, Urine 02/24/2016 1.020  1.003 - 1.035 Final  . Blood 02/24/2016 Large  Negative Final  . pH 02/24/2016 6.0  4.6 - 8.0 Final    . Protein 02/24/2016 30  Negative- <30 mg/dL Final  . Urobilinogen, UR 02/24/2016 0.2  0.2 - 1 mg/dL Final  . Nitrite 02/24/2016 Positive  Negative Final  . Leukocyte Esterase 02/24/2016 Trace  Negative Final  . RBC / HPF 02/24/2016 3-6  0 - 2 Final  . WBC, UA 02/24/2016 11-20  0 - 2 Final  . Bacteria, UA 02/24/2016 Many  Negative- Trace Final  . Casts 02/24/2016 Granular, hyaline  Negative Final  . Epithelial Cells 02/24/2016 Few  Negative- Few Final  . COMMENTS: 02/24/2016 4 cc specimen  Negative Final    RADIOGRAPHIC STUDIES: No results found.  ASSESSMENT/PLAN:    GERD (gastroesophageal reflux disease) Patient states that she has noticed some significant heartburn/reflux with some burning in the back as well.  Since her cycle of chemotherapy on 02/17/2016.  She denies any oral lesions in the colon but states that her tongue burns.  She has tried no over-the-counter medications.  Exam today reveals patient's tongue is red; and mildly sensitive; but no oral lesions noted on exam.  Posterior oropharynx clear with no erythema or exudate.  Long discussion with the patient and her husband.  Patient was advised that she is most likely suffering  from some GERD.  She was advised to try Prilosec over-the-counter as directed.  Also prescribed Carafate to see this helps.  Also offered Magic mouthwash with lidocaine; but patient refused.  Patient was also encouraged to eat more bland foods to see if this helps as well.  She was advised to call/return or go directly to the emergency department for worsening symptoms whatsoever.  Dehydration Patient states that she has been limiting her oral intake due to the reflux issues.  She feels mildly dehydrated today; and will give IV fluid rehydration.  She was also encouraged to push fluids.  Breast cancer of upper-inner quadrant of right female breast [facility]) Patient received her first cycle of Taxotere/Cytoxan on 02/17/2016.  She is scheduled to return  for labs, flush, visit, chemotherapy on 03/09/2016.   Patient stated understanding of all instructions; and was in agreement with this plan of care. The patient knows to call the clinic with any problems, questions or concerns.   Total time spent with patient was 25 minutes;  with greater than 75 percent of that time spent in face to face counseling regarding patient's symptoms,  and coordination of care and follow up.  Disclaimer:This dictation was prepared with Dragon/digital dictation along with [employer]. Any transcriptional errors that result from this process are unintentional.  Drue Second, NP 02/29/2016

## 2016-03-05 ENCOUNTER — Ambulatory Visit (HOSPITAL_BASED_OUTPATIENT_CLINIC_OR_DEPARTMENT_OTHER): Payer: Medicare Other | Admitting: Nurse Practitioner

## 2016-03-05 ENCOUNTER — Telehealth: Payer: Self-pay | Admitting: *Deleted

## 2016-03-05 ENCOUNTER — Encounter: Payer: Self-pay | Admitting: Nurse Practitioner

## 2016-03-05 ENCOUNTER — Encounter (HOSPITAL_COMMUNITY): Payer: Self-pay | Admitting: Emergency Medicine

## 2016-03-05 ENCOUNTER — Emergency Department (HOSPITAL_COMMUNITY)
Admission: EM | Admit: 2016-03-05 | Discharge: 2016-03-05 | Disposition: A | Discharge status: Home / Self Care | Payer: Medicare Other | Attending: Emergency Medicine | Admitting: Emergency Medicine

## 2016-03-05 VITALS — BP 143/53 | HR 73 | Temp 98.2°F | Resp 17 | Ht 64.0 in | Wt 201.6 lb

## 2016-03-05 DIAGNOSIS — I130 Hypertensive heart and chronic kidney disease with heart failure and stage 1 through stage 4 chronic kidney disease, or unspecified chronic kidney disease: Secondary | ICD-10-CM | POA: Insufficient documentation

## 2016-03-05 DIAGNOSIS — I2510 Atherosclerotic heart disease of native coronary artery without angina pectoris: Secondary | ICD-10-CM | POA: Diagnosis not present

## 2016-03-05 DIAGNOSIS — K219 Gastro-esophageal reflux disease without esophagitis: Secondary | ICD-10-CM

## 2016-03-05 DIAGNOSIS — Z952 Presence of prosthetic heart valve: Secondary | ICD-10-CM | POA: Insufficient documentation

## 2016-03-05 DIAGNOSIS — F309 Manic episode, unspecified: Secondary | ICD-10-CM

## 2016-03-05 DIAGNOSIS — E162 Hypoglycemia, unspecified: Secondary | ICD-10-CM

## 2016-03-05 DIAGNOSIS — N189 Chronic kidney disease, unspecified: Secondary | ICD-10-CM | POA: Diagnosis not present

## 2016-03-05 DIAGNOSIS — C50211 Malignant neoplasm of upper-inner quadrant of right female breast: Secondary | ICD-10-CM

## 2016-03-05 DIAGNOSIS — Z79899 Other long term (current) drug therapy: Secondary | ICD-10-CM | POA: Insufficient documentation

## 2016-03-05 DIAGNOSIS — F419 Anxiety disorder, unspecified: Secondary | ICD-10-CM | POA: Diagnosis not present

## 2016-03-05 DIAGNOSIS — Z7982 Long term (current) use of aspirin: Secondary | ICD-10-CM | POA: Insufficient documentation

## 2016-03-05 DIAGNOSIS — Z955 Presence of coronary angioplasty implant and graft: Secondary | ICD-10-CM | POA: Insufficient documentation

## 2016-03-05 DIAGNOSIS — E860 Dehydration: Secondary | ICD-10-CM | POA: Diagnosis not present

## 2016-03-05 DIAGNOSIS — E11649 Type 2 diabetes mellitus with hypoglycemia without coma: Secondary | ICD-10-CM | POA: Insufficient documentation

## 2016-03-05 DIAGNOSIS — K2100 Gastro-esophageal reflux disease with esophagitis, without bleeding: Secondary | ICD-10-CM

## 2016-03-05 DIAGNOSIS — Z853 Personal history of malignant neoplasm of breast: Secondary | ICD-10-CM | POA: Insufficient documentation

## 2016-03-05 DIAGNOSIS — G4700 Insomnia, unspecified: Secondary | ICD-10-CM | POA: Diagnosis not present

## 2016-03-05 DIAGNOSIS — I5032 Chronic diastolic (congestive) heart failure: Secondary | ICD-10-CM | POA: Insufficient documentation

## 2016-03-05 DIAGNOSIS — J4530 Mild persistent asthma, uncomplicated: Secondary | ICD-10-CM | POA: Insufficient documentation

## 2016-03-05 HISTORY — DX: Manic episode, unspecified: F30.9

## 2016-03-05 LAB — CBC WITH DIFFERENTIAL/PLATELET
Basophils Absolute: 0.1 K/uL (ref 0.0–0.1)
Basophils Relative: 1 %
Eosinophils Absolute: 0 K/uL (ref 0.0–0.7)
Eosinophils Relative: 0 %
HCT: 30.7 % — ABNORMAL LOW (ref 36.0–46.0)
Hemoglobin: 10 g/dL — ABNORMAL LOW (ref 12.0–15.0)
Lymphocytes Relative: 9 %
Lymphs Abs: 1.1 K/uL (ref 0.7–4.0)
MCH: 29.4 pg (ref 26.0–34.0)
MCHC: 32.6 g/dL (ref 30.0–36.0)
MCV: 90.3 fL (ref 78.0–100.0)
Monocytes Absolute: 1.1 K/uL — ABNORMAL HIGH (ref 0.1–1.0)
Monocytes Relative: 10 %
Neutro Abs: 9.1 K/uL — ABNORMAL HIGH (ref 1.7–7.7)
Neutrophils Relative %: 80 %
Platelets: 199 K/uL (ref 150–400)
RBC: 3.4 MIL/uL — ABNORMAL LOW (ref 3.87–5.11)
RDW: 13.6 % (ref 11.5–15.5)
WBC: 11.4 K/uL — ABNORMAL HIGH (ref 4.0–10.5)

## 2016-03-05 LAB — BASIC METABOLIC PANEL
Anion gap: 6 (ref 5–15)
BUN: 22 mg/dL — ABNORMAL HIGH (ref 6–20)
CO2: 26 mmol/L (ref 22–32)
Calcium: 7.8 mg/dL — ABNORMAL LOW (ref 8.9–10.3)
Chloride: 107 mmol/L (ref 101–111)
Creatinine, Ser: 1.67 mg/dL — ABNORMAL HIGH (ref 0.44–1.00)
GFR calc Af Amer: 35 mL/min — ABNORMAL LOW (ref >60)
GFR calc non Af Amer: 30 mL/min — ABNORMAL LOW (ref >60)
Glucose, Bld: 204 mg/dL — ABNORMAL HIGH (ref 65–99)
Potassium: 4.6 mmol/L (ref 3.5–5.1)
Sodium: 139 mmol/L (ref 135–145)

## 2016-03-05 MED ORDER — HYDROXYZINE HCL 25 MG PO TABS: 25.0000 mg | ORAL_TABLET | Freq: Every evening | ORAL | 0 refills | Status: DC | PRN

## 2016-03-05 MED ORDER — TRAZODONE HCL 50 MG PO TABS: 50.0000 mg | ORAL_TABLET | Freq: Every day | ORAL | 1 refills | Status: DC

## 2016-03-05 NOTE — ED Triage Notes (Signed)
Patient states that hasnt slept in over week.  Patient states that she is anxious and restless that started on Friday. Patient sent from cancer center for treatment due to psychiatrist referral MD tried to make there couldn't get seen today.  Patient denies SI or HI.

## 2016-03-05 NOTE — Telephone Encounter (Signed)
Pt called with c/o extreme anxiety and not sleeping since started chemo. Msg sent to schedule for symptom management. Nurse notified of pt complaint.

## 2016-03-05 NOTE — Assessment & Plan Note (Signed)
Patient was given Prilosec and Carafate last week for complaint of GERD.  Patient states that her GERD symptoms have greatly improved since that time.

## 2016-03-05 NOTE — ED Provider Notes (Signed)
Central DEPT Provider Note   CSN: [id number] Arrival date & time: 03/05/16  1251     History   Chief Complaint Chief Complaint  Patient presents with  . anxious  . Insomnia  . restless    HPI Hannah [name] is a 70 y.o. female.  HPI Patient presents to the emergency room for evaluation of anxiety and insomnia. Patient has history of breast cancer. She is undergoing chemotherapy. Her last treatment was on September 8.  Patient is noticed increasing anxiety and insomnia over the last several days. She tried taking Ativan and Benadryl without relief. Patient denies any suicidal or homicidal ideation. She denies being depressed. Patient went down to the oncology office today. She mentioned these symptoms. They attempted to consult with a psychiatrist. There were unable to get an outpatient appointment so the patient was instructed to come to the emergency room for medical evaluation.   Past Medical History:  Diagnosis Date  . Abnormally small mouth   . Anemia   . Bilateral carotid artery stenosis    Bilateral ICA 40-59%/  >50% LECA  . Breast cancer ([place])   . Breast cancer of upper-inner quadrant of right female breast ([place]) 12/16/2015  . Bruises easily   . CAD (coronary artery disease) cardiologist-  dr Aundra Dubin   Remote CABG in 1998 with redo in 2009  . CHF (congestive heart failure) (HCC)    EF is low normal at 50 to 55% per echo in Jan. 2011  . Dyspnea   . GERD (gastroesophageal reflux disease)   . Hearing loss of right ear   . History of non-ST elevation myocardial infarction (NSTEMI)    Sept 2014--  thought to be type II HTN w/ LHC without infarct related artery and patent grafts  . Hyperlipidemia   . Hypertension   . Iron deficiency anemia   . Moderate persistent asthma    pulmologist-  Dr. Malvin Johns  . Nausea & vomiting    related to stone  . Nonischemic cardiomyopathy ([place])   . PAF (paroxysmal atrial fibrillation) ([place])   . PONV (postoperative nausea and  vomiting)    pt states patch worked well before  . Psoriasis    right leg  . Renal calculus, right   . Renal insufficiency, mild   . S/P AVR    prosthesis valve placement 2009 at same time re-do CABG  . 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 Type 2 diabetes mellitus ([place])    monitored by dr Dwyane Dee    Patient Active Problem List   Diagnosis Date Noted  . Mania ([place]) 03/05/2016  . Dehydration 02/29/2016  . GERD (gastroesophageal reflux disease) 02/29/2016  . Chemotherapy induced nausea and vomiting 02/24/2016  . Port catheter in place 02/17/2016  . Breast cancer of upper-inner quadrant of right female breast ([place]) 12/16/2015  . Acute bronchitis 08/01/2015  . CKD (chronic kidney disease) 09/24/2013  . Chronic diastolic CHF (congestive heart failure) ([place]) 09/24/2013  . Acute on chronic combined systolic and diastolic CHF, NYHA class 3 ([place]) 07/17/2013  . Chest pain 06/15/2013  . Unstable angina ([place]) 06/14/2013  . Atrial tachycardia ([place]) 04/03/2013  . Non-ST elevation myocardial infarction (NSTEMI), initial episode of care ([place]) 02/23/2013  . CAD (coronary artery disease) 01/24/2011  . S/P aortic valve replacement 10/27/2010  . Hypertension   . Hyperlipidemia   . SOB (shortness of breath)   . CHF (congestive heart failure) ([place])   . Renal insufficiency, mild   .  Dyspnea   . Nonischemic cardiomyopathy ([place])   . Anemia   . Diabetes mellitus, type 2 ([place])   . Iron deficiency anemia   . Allergic rhinitis 10/14/2009  . COUGH 10/14/2009  . HYPERLIPIDEMIA 05/12/2009  . HYPERTENSION 05/12/2009  . Atrial fibrillation ([place]) 05/12/2009  . Asthma 05/12/2009  . DYSPNEA 05/12/2009    Past Surgical History:  Procedure Laterality Date  . AORTIC VALVE REPLACEMENT  2009   #77mm [facility] Ease pericardial valve (done same time is CABG)  . BREAST LUMPECTOMY WITH RADIOACTIVE SEED AND SENTINEL LYMPH NODE BIOPSY Right 01/18/2016   Procedure: RIGHT BREAST LUMPECTOMY WITH  RADIOACTIVE SEED AND SENTINEL LYMPH NODE BIOPSY;  Surgeon: Alphonsa Overall, MD;  Location: [place] [facility];  Service: General;  Laterality: Right;  . CARDIAC CATHETERIZATION  03/23/2008   Pre-redo CABG: L main OK, LAD (T), CFX (T), OM1 99%, RCA (T), LIMA-LAD OK, SVG-OM(?3) OK w/ little florw to OM2, SVG-RCA OK. EF NL  . CARDIOVASCULAR STRESS TEST  06-15-2013      dr Debara Pickett   [facility] perfusion study/  no evidence ischemia/  normal wall motion, ef 72%  . CARPAL TUNNEL RELEASE    . COLONOSCOPY    . CORONARY ARTERY BYPASS GRAFT  1998 &  re-do 2009   Had LIMA to DX/LAD, SVG to 2 marginal branches and SVG to [facility] originally; SVG to 3rd OM and PD at time of redo  . CYSTOSCOPY W/ URETERAL STENT PLACEMENT Right 12/20/2014   Procedure: CYSTOSCOPY WITH RETROGRADE PYELOGRAM/URETERAL STENT PLACEMENT;  Surgeon: Cleon Gustin, MD;  Location: [facility];  Service: Urology;  Laterality: Right;  . EYE SURGERY Bilateral    cataracts  . HOLMIUM LASER APPLICATION Right 2/99/3716   Procedure:  HOLMIUM LASER LITHOTRIPSY;  Surgeon: Cleon Gustin, MD;  Location[facility];  Service: Urology;  Laterality: Right;  . LEFT HEART CATHETERIZATION WITH CORONARY/GRAFT ANGIOGRAM N/A 02/23/2013   Procedure: LEFT HEART CATHETERIZATION WITH Beatrix Fetters;  Surgeon: Blane Ohara, MD;  Location: [facility] CATH LAB;  Service: Cardiovascular;  Laterality: N/A;  . PORTACATH PLACEMENT Left 01/18/2016   Procedure: INSERTION PORT-A-CATH;  Surgeon: Alphonsa Overall, MD;  Location: [place];  Service: General;  Laterality: Left;  . TONSILLECTOMY    . TRANSTHORACIC ECHOCARDIOGRAM  02-24-2013      mild LVH,  ef 50-55%/  AV bioprosthesis was present with very mild stenosis and no regurg., mean grandient 60mmHg, peak grandient 23mmHg /  mild MR/  mild LAE and RAE/  moderate TR  . TUBAL LIGATION      OB History    No data available       Home Medications    Prior to Admission medications   Medication Sig  Start Date End Date Taking? Authorizing Provider  ACCU-CHEK AVIVA PLUS test strip  08/25/15   Historical Provider, MD  alendronate (FOSAMAX) 70 MG tablet Take 70 mg by mouth once a week. Take with a full glass of water on an empty stomach.    Historical Provider, MD  aspirin EC 81 MG EC tablet Take 1 tablet (81 mg total) by mouth daily. 06/16/13   Brittainy Erie Noe, PA-C  budesonide-formoterol (SYMBICORT) 160-4.5 MCG/ACT inhaler INHALE TWO PUFFS INTO LUNGS TWICE DAILY 07/21/15   Collene Gobble, MD  ciprofloxacin (CIPRO) 500 MG tablet Take 1 tablet (500 mg total) by mouth 2 (two) times daily. 02/24/16   Susanne Borders, NP  clopidogrel (PLAVIX) 75 MG tablet Take 75 mg by  mouth daily.    Historical Provider, MD  HUMALOG 100 UNIT/ML injection Inject 76 units daily with  V-GO pump 11/17/15   Elayne Snare, MD  HYDROcodone-acetaminophen (NORCO/VICODIN) 5-325 MG tablet Take 1-2 tablets by mouth every 6 (six) hours as needed. 01/18/16   Alphonsa Overall, MD  lidocaine-prilocaine (EMLA) cream Apply to affected area once 01/26/16   Nicholas Lose, MD  loratadine (CLARITIN) 10 MG tablet Take 10 mg by mouth daily.    Historical Provider, MD  LORazepam (ATIVAN) 0.5 MG tablet Take 1 tablet (0.5 mg total) by mouth every 8 (eight) hours as needed for anxiety. 02/20/16   Nicholas Lose, MD  metoprolol succinate (TOPROL-XL) 100 MG 24 hr tablet TAKE ONE TABLET BY MOUTH TWICE DAILY TAKE  WITH  OR  IMMEDIATELY  FOLLOWING  A  MEAL 06/08/15   Larey Dresser, MD  ondansetron (ZOFRAN) 8 MG tablet Take 1 tablet (8 mg total) by mouth 2 (two) times daily as needed for refractory nausea / vomiting. Start on day 3 after chemo. 01/26/16   Nicholas Lose, MD  prochlorperazine (COMPAZINE) 10 MG tablet Take 1 tablet (10 mg total) by mouth every 6 (six) hours as needed (Nausea or vomiting). 02/24/16   Nicholas Lose, MD  rosuvastatin (CRESTOR) 40 MG tablet TAKE ONE TABLET BY MOUTH ONCE DAILY 10/25/15   Larey Dresser, MD  sucralfate (CARAFATE) 1 GM/10ML  suspension Take 10 mLs (1 g total) by mouth 4 (four) times daily -  with meals and at bedtime. 02/28/16   Susanne Borders, NP  TraMADol HCl 200 MG CP24 Take 1 tablet by mouth daily. Take every day per patient 12/28/15   Historical Provider, MD  TRULICITY 7.62 GB/1.5VV SOPN Once a week. On fridays 12/30/15   Historical Provider, MD  ZETIA 10 MG tablet Take 1 tablet by mouth  daily 11/17/15   Elayne Snare, MD    Family History Family History  Problem Relation Age of Onset  . Heart disease Mother   . Heart disease Father   . Heart failure Father   . Diabetes Maternal Grandmother   . Diabetes Son     Social History Social History  Substance Use Topics  . Smoking status: Never Smoker  . Smokeless tobacco: Never Used  . Alcohol use No     Allergies   Imdur [isosorbide dinitrate]; Amoxicillin; and Tetracycline   Review of Systems Review of Systems  Psychiatric/Behavioral: Positive for sleep disturbance. Negative for hallucinations, self-injury and suicidal ideas. The patient is nervous/anxious.   All other systems reviewed and are negative.    Physical Exam Updated Vital Signs BP 132/70 (BP Location: Left Arm)   Pulse 80   Temp 98.5 F (36.9 C) (Oral)   Resp 18   SpO2 99%   Physical Exam  Constitutional: She appears well-developed and well-nourished. No distress.  HENT:  Head: Normocephalic and atraumatic.  Right Ear: External ear normal.  Left Ear: External ear normal.  Eyes: Conjunctivae are normal. Right eye exhibits no discharge. Left eye exhibits no discharge. No scleral icterus.  Neck: Neck supple. No tracheal deviation present.  Cardiovascular: Normal rate and regular rhythm.   Pulmonary/Chest: Effort normal and breath sounds normal. No stridor. No respiratory distress. She has no wheezes.  Abdominal: She exhibits no distension.  Musculoskeletal: She exhibits no edema.  Neurological: She is alert. Cranial nerve deficit: no gross deficits.  Skin: Skin is warm and  dry. No rash noted.  Psychiatric: She has a normal mood and affect.  Nursing note and vitals reviewed.    ED Treatments / Results  Labs (all labs ordered are listed, but only abnormal results are displayed) Labs Reviewed - No data to display  EKG  EKG Interpretation None       Radiology No results found.  Procedures Procedures (including critical care time)  Medications Ordered in ED Medications - No data to display   Initial Impression / Assessment and Plan / ED Course  I have reviewed the triage vital signs and the nursing notes.  Pertinent labs & imaging results that were available during my care of the patient were reviewed by me and considered in my medical decision making (see chart for details).  Clinical Course  Comment By Time  Labs reviewed.  Hgb stable.  Cr slightly increased.  Calcium is low but doubt clinically significant.  Can follow up with oncology   Dorie Rank, MD 09/25 1525   Patient denies any suicidal or homicidal ideation. The psychiatrist is no longer available in the emergency room at this time of day. I spoke with the psychiatric nurse practitioner who kindly offered some suggestions. She suggested Vistaril and trazodone.  I will check the patient's blood count and electrolyte panel. There are no significant abnormalities I will discharge her home with prescriptions for Vistaril and trazodone. Continued outpatient follow-up.  Final Clinical Impressions(s) / ED Diagnoses   Final diagnoses:  Anxiety  Insomnia  Hypoglycemia    New Prescriptions New Prescriptions   HYDROXYZINE (ATARAX/VISTARIL) 25 MG TABLET    Take 1 tablet (25 mg total) by mouth at bedtime as needed and may repeat dose one time if needed.   TRAZODONE (DESYREL) 50 MG TABLET    Take 1 tablet (50 mg total) by mouth at bedtime.     Dorie Rank, MD 03/05/16 1536

## 2016-03-05 NOTE — Assessment & Plan Note (Addendum)
Patient states that she has had minimal sleep since last Tuesday or Wednesday of last week.  She states that she has noticed increased difficulty with even sitting still, since this past Friday, 03/02/2016.  She states that she is constantly making circles in her home; because it is too difficult for her to sit down.  She states that she has also been unable to focus; and cannot carry on her daily activities at home.  She states that "her hands aren't shaking- but she feels like there are bugs on her inside; and she is very jittery inside".  Patient states that she has tried taking Ativan double dose at 1 mg and Benadryl 50 mg; with no effectiveness.  Patient states that she has no history of either depression or manic episodes in the past.  Patient confirmed that she has not been taking any new medications.  She has just completed a cycle of Cipro antibiotics for treatment of a UTI.  Exam today reveals patient was alert and oriented; and very cooperative-but unable to sit in a chair for more than a few minutes at a time.  Patient was frequently pacing the room.  Patient also stated that "all of her hair is falling out because of the chemo; and she just doesn't care- because it is too hard to focus on that issue".  Patient was able to follow all commands.  Patient was asked if she felt depressed; and she stated that she was not depressed whatsoever.  She also denies any previous psychiatric issues in the past as well.  She states approximately 15 years ago that she took "xanax for her a-fib- but then this was changed to lopressor to treat her a-fib".   Also reviewed the rest of patient's medication list.  Patient states that she has rarely taken any Compazine; and only occasionally uses the Symbicort.  Reviewed all findings with Dr. Lindi Adie.  Dr. Lindi Adie attempted to reach a local psychiatrist for further eval; but was only able to leave a voicemail.  He recommended patient undergo medical clearance in the  emergency department today for further evaluation.  Discussed all evaluation options with the patient and her husband; and patient was in agreement to go to the emergency department for further evaluation and medical clearance.  Brief history and report were called to the emergency department charge nurse; prior to the patient being transported to the emergency department via wheelchair.  Per the cancer Center nurse.    CODE STATUS: Patient should be considered a full code; since there are no advanced directives in the patient's chart.

## 2016-03-05 NOTE — Assessment & Plan Note (Signed)
Patient states that she is no longer dehydrated.  She is eating and drinking with no difficulties.  Blood pressure is improved and is back to baseline.

## 2016-03-05 NOTE — Progress Notes (Signed)
SYMPTOM MANAGEMENT CLINIC    Chief Complaint: Mania  HPI:  Hannah [name] 70 y.o. female diagnosed with breast cancer.  Currently undergoing Taxotere/Cytoxan chemotherapy regimen.     Breast cancer of upper-inner quadrant of right female breast ([place])   12/14/2015 Initial Diagnosis    Right breast biopsy 12:30 position: 2 masses, 1.6 cm mass: Invasive ductal carcinoma, grade 2, ER 0%, PR 0%, HER-2 negative, Ki-67 70%; satellite mass 8 mm: IDC grade 2 ER 5%, PR 5%, HER-2 negative, Ki-67 50%; T1cN0 stage IA clinical stage      01/18/2016 Surgery    Right lumpectomy: Multifocal IDC grade 3, 1.9 cm  ER 0%, PR 0%, HER-2 negative, Ki-67 70% and 0.8 cm satellite mass ER 5%, PR 2%, HER-2 negative, Ki-67 50%, high-grade DCIS, margins negative, 0/1 lymph nodes negative T1 cN0 stage IA      02/17/2016 -  Anti-estrogen oral therapy    Adjuvant chemotherapy with Taxotere and Cytoxan4       Review of Systems  Psychiatric/Behavioral: The patient is nervous/anxious and has insomnia.   All other systems reviewed and are negative.   Past Medical History:  Diagnosis Date  . Abnormally small mouth   . Anemia   . Bilateral carotid artery stenosis    Bilateral ICA 40-59%/  >50% LECA  . Breast cancer ([place])   . Breast cancer of upper-inner quadrant of right female breast ([place]) 12/16/2015  . Bruises easily   . CAD (coronary artery disease) cardiologist-  dr Aundra Dubin   Remote CABG in 1998 with redo in 2009  . CHF (congestive heart failure) (HCC)    EF is low normal at 50 to 55% per echo in Jan. 2011  . Dyspnea   . GERD (gastroesophageal reflux disease)   . Hearing loss of right ear   . History of non-ST elevation myocardial infarction (NSTEMI)    Sept 2014--  thought to be type II HTN w/ LHC without infarct related artery and patent grafts  . Hyperlipidemia   . Hypertension   . Iron deficiency anemia   . Moderate persistent asthma    pulmologist-  Dr. Malvin Johns  . Nausea & vomiting    related to  stone  . Nonischemic cardiomyopathy ([place])   . PAF (paroxysmal atrial fibrillation) ([place])   . PONV (postoperative nausea and vomiting)    pt states patch worked well before  . Psoriasis    right leg  . Renal calculus, right   . Renal insufficiency, mild   . S/P AVR    prosthesis valve placement 2009 at same time re-do CABG  . 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 Type 2 diabetes mellitus ([place])    monitored by dr Dwyane Dee    Past Surgical History:  Procedure Laterality Date  . AORTIC VALVE REPLACEMENT  2009   [facility] (done same time is CABG)  . BREAST LUMPECTOMY WITH RADIOACTIVE SEED AND SENTINEL LYMPH NODE BIOPSY Right 01/18/2016   Procedure: RIGHT BREAST LUMPECTOMY WITH RADIOACTIVE SEED AND SENTINEL LYMPH NODE BIOPSY;  Surgeon: DAlphonsa Overall MD;  [place]  Service: General;  Laterality: Right;  . CARDIAC CATHETERIZATION  03/23/2008   Pre-redo CABG: L main OK, LAD (T), CFX (T), OM1 99%, RCA (T), LIMA-LAD OK, SVG-OM(?3) OK w/ little florw to OM2, SVG-RCA OK. EF NL  . CARDIOVASCULAR STRESS TEST  06-15-2013      dr hDebara Pickett  [place]fusion study/  no evidence  ischemia/  normal wall motion, ef 72%  . CARPAL TUNNEL RELEASE    . COLONOSCOPY    . CORONARY ARTERY BYPASS GRAFT  1998 &  re-do 2009   Had LIMA to DX/LAD, SVG to 2 marginal branches and SVG to [facility] originally; SVG to 3rd OM and PD at time of redo  . CYSTOSCOPY W/ URETERAL STENT PLACEMENT Right 12/20/2014   Procedure: CYSTOSCOPY WITH RETROGRADE PYELOGRAM/URETERAL STENT PLACEMENT;  Surgeon: Cleon Gustin, MD;  Location: [facility];  Service: Urology;  Laterality: Right;  . EYE SURGERY Bilateral    cataracts  . HOLMIUM LASER APPLICATION Right 09/18/7351   Procedure:  HOLMIUM LASER LITHOTRIPSY;  Surgeon: Cleon Gustin, MD;  Location: [facility];  Service: Urology;  Laterality: Right;  . LEFT HEART CATHETERIZATION WITH CORONARY/GRAFT ANGIOGRAM N/A  02/23/2013   Procedure: LEFT HEART CATHETERIZATION WITH Beatrix Fetters;  Surgeon: Blane Ohara, MD;  Location: [facility] CATH LAB;  Service: Cardiovascular;  Laterality: N/A;  . PORTACATH PLACEMENT Left 01/18/2016   Procedure: INSERTION PORT-A-CATH;  Surgeon: Alphonsa Overall, MD;  Location: [place];  Service: General;  Laterality: Left;  . TONSILLECTOMY    . TRANSTHORACIC ECHOCARDIOGRAM  02-24-2013      mild LVH,  ef 50-55%/  AV bioprosthesis was present with very mild stenosis and no regurg., mean grandient 48mHg, peak grandient 251mg /  mild MR/  mild LAE and RAE/  moderate TR  . TUBAL LIGATION      has HYPERLIPIDEMIA; HYPERTENSION; Atrial fibrillation ([facility] Allergic rhinitis; Asthma; DYSPNEA; COUGH; Hypertension; Hyperlipidemia; SOB (shortness of breath); CHF (congestive heart failure) (HCM[place] Renal insufficiency, mild; Dyspnea; Nonischemic cardiomyopathy ([place]CC[place] Anemia; Diabetes mellitus, type 2 (HC[place] Iron deficiency anemia; S/P aortic valve replacement; CAD (coronary artery disease); Non-ST elevation myocardial infarction (NSTEMI), [facility] Atrial tachycardia (HCOliver Unstable angina (HCGarrison Chest pain; Acute on chronic combined systolic and diastolic CHF, NYHA class 3 (HCMillard CKD (chronic kidney disease); Chronic diastolic CHF (congestive heart failure) (HCH[place] Acute bronchitis; Breast cancer of upper-inner quadrant of right female breast ([place] catheter in place; Chemotherapy induced nausea and vomiting; Dehydration; GERD (gastroesophageal reflux disease); and Mania (HCCalverton Parkon her problem list.    is allergic to imdur [isosorbide dinitrate]; amoxicillin; and tetracycline.    Medication List       Accurate as of 03/05/16 12:46 PM. Always use your most recent med list.          ACCU-CHEK AVIVA PLUS test strip Generic drug:  glucose blood   alendronate 70 MG tablet Commonly known as:  FOSAMAX Take 70 mg by mouth once a week. Take with a full glass of water on an  empty stomach.   aspirin 81 MG EC tablet Take 1 tablet (81 mg total) by mouth daily.   budesonide-formoterol 160-4.5 MCG/ACT inhaler Commonly known as:  SYMBICORT INHALE TWO PUFFS INTO LUNGS TWICE DAILY   ciprofloxacin 500 MG tablet Commonly known as:  CIPRO Take 1 tablet (500 mg total) by mouth 2 (two) times daily.   clopidogrel 75 MG tablet Commonly known as:  PLAVIX Take 75 mg by mouth daily.   HUMALOG 100 UNIT/ML injection Generic drug:  insulin lispro Inject 76 units daily with  V-GO pump   HYDROcodone-acetaminophen 5-325 MG tablet Commonly known as:  NORCO/VICODIN Take 1-2 tablets by mouth every 6 (six) hours as needed.   lidocaine-prilocaine cream Commonly known as:  EMLA Apply to affected area once   loratadine 10 MG tablet Commonly known  as:  CLARITIN Take 10 mg by mouth daily.   LORazepam 0.5 MG tablet Commonly known as:  ATIVAN Take 1 tablet (0.5 mg total) by mouth every 8 (eight) hours as needed for anxiety.   metoprolol succinate 100 MG 24 hr tablet Commonly known as:  TOPROL-XL TAKE ONE TABLET BY MOUTH TWICE DAILY TAKE  WITH  OR  IMMEDIATELY  FOLLOWING  A  MEAL   ondansetron 8 MG tablet Commonly known as:  ZOFRAN Take 1 tablet (8 mg total) by mouth 2 (two) times daily as needed for refractory nausea / vomiting. Start on day 3 after chemo.   prochlorperazine 10 MG tablet Commonly known as:  COMPAZINE Take 1 tablet (10 mg total) by mouth every 6 (six) hours as needed (Nausea or vomiting).   rosuvastatin 40 MG tablet Commonly known as:  CRESTOR TAKE ONE TABLET BY MOUTH ONCE DAILY   sucralfate 1 GM/10ML suspension Commonly known as:  CARAFATE Take 10 mLs (1 g total) by mouth 4 (four) times daily -  with meals and at bedtime.   TraMADol HCl 200 MG Cp24 Take 1 tablet by mouth daily. Take every day per patient   TRULICITY 9.62 XB/2.8UX Sopn Generic drug:  Dulaglutide Once a week. On fridays   ZETIA 10 MG tablet Generic drug:  ezetimibe Take 1  tablet by mouth  daily        PHYSICAL EXAMINATION  Oncology Vitals 03/05/2016 02/28/2016  Height 163 cm 163 cm  Weight 91.445 kg 87.952 kg  Weight (lbs) 201 lbs 10 oz 193 lbs 14 oz  BMI (kg/m2) 34.6 kg/m2 33.28 kg/m2  Temp 98.2 98.6  Pulse 73 75  Resp 17 18  SpO2 100 96  BSA (m2) 2.03 m2 1.99 m2   BP Readings from Last 2 Encounters:  03/05/16 (!) 143/53  02/28/16 (!) 99/47    Physical Exam  Constitutional: She is oriented to person, place, and time and well-developed, well-nourished, and in no distress.  HENT:  Head: Normocephalic and atraumatic.  Mouth/Throat: Oropharynx is clear and moist.  Eyes: Conjunctivae and EOM are normal. Pupils are equal, round, and reactive to light. Right eye exhibits no discharge. Left eye exhibits no discharge. No scleral icterus.  Neck: Normal range of motion. Neck supple. No JVD present. No tracheal deviation present. No thyromegaly present.  Cardiovascular: Normal rate, regular rhythm, normal heart sounds and intact distal pulses.   Pulmonary/Chest: Effort normal and breath sounds normal. No respiratory distress. She has no wheezes. She has no rales. She exhibits no tenderness.  Abdominal: Soft. Bowel sounds are normal. She exhibits no distension and no mass. There is no tenderness. There is no rebound and no guarding.  Musculoskeletal: Normal range of motion. She exhibits no edema or tenderness.  Lymphadenopathy:    She has no cervical adenopathy.  Neurological: She is alert and oriented to person, place, and time. Gait normal.  Skin: Skin is warm and dry. No rash noted. No erythema. No pallor.  Psychiatric:  Exam today reveals patient was alert and oriented; and very cooperative-but unable to sit in a chair for more than a few minutes at a time.  Patient was frequently pacing the room.  Patient also stated that "all of her hair is falling out because of the chemo; and she just doesn't care- because it is too hard to focus on that issue".   Patient was able to follow all commands.       Nursing note and vitals reviewed.   LABORATORY  DATA:. No visits with results within 3 Day(s) from this visit.  Latest known visit with results is:  Appointment on 02/24/2016  Component Date Value Ref Range Status  . WBC 02/24/2016 3.6* 3.9 - 10.3 10e3/uL Final  . NEUT# 02/24/2016 1.5  1.5 - 6.5 10e3/uL Final  . HGB 02/24/2016 10.7* 11.6 - 15.9 g/dL Final  . HCT 02/24/2016 32.4* 34.8 - 46.6 % Final  . Platelets 02/24/2016 147  145 - 400 10e3/uL Final  . MCV 02/24/2016 87.2  79.5 - 101.0 fL Final  . MCH 02/24/2016 28.9  25.1 - 34.0 pg Final  . MCHC 02/24/2016 33.2  31.5 - 36.0 g/dL Final  . RBC 02/24/2016 3.71  3.70 - 5.45 10e6/uL Final  . RDW 02/24/2016 13.1  11.2 - 14.5 % Final  . lymph# 02/24/2016 0.7* 0.9 - 3.3 10e3/uL Final  . MONO# 02/24/2016 1.1* 0.1 - 0.9 10e3/uL Final  . Eosinophils Absolute 02/24/2016 0.3  0.0 - 0.5 10e3/uL Final  . Basophils Absolute 02/24/2016 0.0  0.0 - 0.1 10e3/uL Final  . NEUT% 02/24/2016 41.4  38.4 - 76.8 % Final  . LYMPH% 02/24/2016 19.0  14.0 - 49.7 % Final  . MONO% 02/24/2016 30.5* 0.0 - 14.0 % Final  . EOS% 02/24/2016 8.4* 0.0 - 7.0 % Final  . BASO% 02/24/2016 0.7  0.0 - 2.0 % Final  . Sodium 02/24/2016 135* 136 - 145 mEq/L Final  . Potassium 02/24/2016 4.5  3.5 - 5.1 mEq/L Final  . Chloride 02/24/2016 101  98 - 109 mEq/L Final  . CO2 02/24/2016 26  22 - 29 mEq/L Final  . Glucose 02/24/2016 132  70 - 140 mg/dl Final  . BUN 02/24/2016 26.1* 7.0 - 26.0 mg/dL Final  . Creatinine 02/24/2016 1.5* 0.6 - 1.1 mg/dL Final  . Total Bilirubin 02/24/2016 0.93  0.20 - 1.20 mg/dL Final  . Alkaline Phosphatase 02/24/2016 53  40 - 150 U/L Final  . AST 02/24/2016 26  5 - 34 U/L Final  . ALT 02/24/2016 17  0 - 55 U/L Final  . Total Protein 02/24/2016 6.0* 6.4 - 8.3 g/dL Final  . Albumin 02/24/2016 2.6* 3.5 - 5.0 g/dL Final  . Calcium 02/24/2016 9.1  8.4 - 10.4 mg/dL Final  . Anion Gap 02/24/2016 8  3 - 11  mEq/L Final  . EGFR 02/24/2016 34* >90 ml/min/1.73 m2 Final  . Glucose 02/24/2016 Negative  Negative mg/dL Final  . Bilirubin (Urine) 02/24/2016 Negative  Negative Final  . Ketones 02/24/2016 Negative  Negative mg/dL Final  . Specific Gravity, Urine 02/24/2016 1.020  1.003 - 1.035 Final  . Blood 02/24/2016 Large  Negative Final  . pH 02/24/2016 6.0  4.6 - 8.0 Final  . Protein 02/24/2016 30  Negative- <30 mg/dL Final  . Urobilinogen, UR 02/24/2016 0.2  0.2 - 1 mg/dL Final  . Nitrite 02/24/2016 Positive  Negative Final  . Leukocyte Esterase 02/24/2016 Trace  Negative Final  . RBC / HPF 02/24/2016 3-6  0 - 2 Final  . WBC, UA 02/24/2016 11-20  0 - 2 Final  . Bacteria, UA 02/24/2016 Many  Negative- Trace Final  . Casts 02/24/2016 Granular, hyaline  Negative Final  . Epithelial Cells 02/24/2016 Few  Negative- Few Final  . COMMENTS: 02/24/2016 4 cc specimen  Negative Final    RADIOGRAPHIC STUDIES: No results found.  ASSESSMENT/PLAN:    Mania [facility]) Patient states that she has had minimal sleep since last Tuesday or Wednesday of last week.  She states that  she has noticed increased difficulty with even sitting still, since this past Friday, 03/02/2016.  She states that she is constantly making circles in her home; because it is too difficult for her to sit down.  She states that she has also been unable to focus; and cannot carry on her daily activities at home.  She states that "her hands aren't shaking- but she feels like there are bugs on her inside; and she is very jittery inside".  Patient states that she has tried taking Ativan double dose at 1 mg and Benadryl 50 mg; with no effectiveness.  Patient states that she has no history of either depression or manic episodes in the past.  Patient confirmed that she has not been taking any new medications.  She has just completed a cycle of Cipro antibiotics for treatment of a UTI.  Exam today reveals patient was alert and oriented; and very  cooperative-but unable to sit in a chair for more than a few minutes at a time.  Patient was frequently pacing the room.  Patient also stated that "all of her hair is falling out because of the chemo; and she just doesn't care- because it is too hard to focus on that issue".  Patient was able to follow all commands.  Patient was asked if she felt depressed; and she stated that she was not depressed whatsoever.  She also denies any previous psychiatric issues in the past as well.  She states approximately 15 years ago that she took "xanax for her a-fib- but then this was changed to lopressor to treat her a-fib".   Also reviewed the rest of patient's medication list.  Patient states that she has rarely taken any Compazine; and only occasionally uses the Symbicort.  Reviewed all findings with Dr. Lindi Adie.  Dr. Lindi Adie attempted to reach a local psychiatrist for further eval; but was only able to leave a voicemail.  He recommended patient undergo medical clearance in the emergency department today for further evaluation.  Discussed all evaluation options with the patient and her husband; and patient was in agreement to go to the emergency department for further evaluation and medical clearance.  Brief history and report were called to the emergency department charge nurse; prior to the patient being transported to the emergency department via wheelchair.  Per the cancer Center nurse.    CODE STATUS: Patient should be considered a full code; since there are no advanced directives in the patient's chart.    GERD (gastroesophageal reflux disease) Patient was given Prilosec and Carafate last week for complaint of GERD.  Patient states that her GERD symptoms have greatly improved since that time.  Dehydration Patient states that she is no longer dehydrated.  She is eating and drinking with no difficulties.  Blood pressure is improved and is back to baseline.  Breast cancer of upper-inner quadrant of right  female breast [facility]) Patient received her first cycle of Taxotere/Cytoxan on 02/17/2016.  She is scheduled to return for labs, flush, visit, chemotherapy on 03/09/2016.   Patient stated understanding of all instructions; and was in agreement with this plan of care. The patient knows to call the clinic with any problems, questions or concerns.   Total time spent with patient was 40 minutes;  with greater than 75 percent of that time spent in face to face counseling regarding patient's symptoms,  and coordination of care and follow up.  Disclaimer:This dictation was prepared with Dragon/digital dictation along with [employer]. Any transcriptional errors that  result from this process are unintentional.  Drue Second, NP 03/05/2016

## 2016-03-05 NOTE — ED Triage Notes (Signed)
Patient brought over from [facility] for jitteriness, insomnia, possibly manic.   Patient getting treatments for breast cancer, last treatment on 02/17/2016.

## 2016-03-05 NOTE — ED Notes (Signed)
Bed: WLPT4 Expected date:  Expected time:  Means of arrival:  Comments: Ca ctr pt/medical clearance

## 2016-03-05 NOTE — Discharge Instructions (Signed)
Follow up with your oncology doctor as planned, start taking the trazodone and the vistaril

## 2016-03-05 NOTE — ED Notes (Signed)
Bed: WA28 Expected date:  Expected time:  Means of arrival:  Comments: 

## 2016-03-05 NOTE — Assessment & Plan Note (Signed)
Patient received her first cycle of Taxotere/Cytoxan on 02/17/2016.  She is scheduled to return for labs, flush, visit, chemotherapy on 03/09/2016.

## 2016-03-05 NOTE — Telephone Encounter (Signed)
Pt states she has been having "terrible anxiety, jittery all over, cannot sleep, I have been pacing all weekend". Took ativan 0.5 mg Friday night without relief. Saturday took 2- 0.5 mg tablets without relief. Saturday night took benadryl 50 mg without relief. States she "needs some relief, is having trouble functioning due to insides shaking".  RN asked if she taken anything in the past for anxiety. States she has hx afib, 15-20 years ago took xanax. Does not remember if it helped.  Pt spoke with nurse navigator, is coming to see NP

## 2016-03-09 ENCOUNTER — Encounter: Payer: Self-pay | Admitting: Hematology and Oncology

## 2016-03-09 ENCOUNTER — Ambulatory Visit (HOSPITAL_BASED_OUTPATIENT_CLINIC_OR_DEPARTMENT_OTHER): Payer: Medicare Other | Admitting: Hematology and Oncology

## 2016-03-09 ENCOUNTER — Ambulatory Visit (HOSPITAL_BASED_OUTPATIENT_CLINIC_OR_DEPARTMENT_OTHER): Payer: Medicare Other

## 2016-03-09 ENCOUNTER — Ambulatory Visit: Payer: Medicare Other | Admitting: Hematology and Oncology

## 2016-03-09 ENCOUNTER — Other Ambulatory Visit (HOSPITAL_BASED_OUTPATIENT_CLINIC_OR_DEPARTMENT_OTHER): Payer: Medicare Other

## 2016-03-09 ENCOUNTER — Ambulatory Visit: Payer: Medicare Other

## 2016-03-09 DIAGNOSIS — G4700 Insomnia, unspecified: Secondary | ICD-10-CM

## 2016-03-09 DIAGNOSIS — Z5111 Encounter for antineoplastic chemotherapy: Secondary | ICD-10-CM

## 2016-03-09 DIAGNOSIS — C50211 Malignant neoplasm of upper-inner quadrant of right female breast: Secondary | ICD-10-CM

## 2016-03-09 DIAGNOSIS — D6481 Anemia due to antineoplastic chemotherapy: Secondary | ICD-10-CM | POA: Diagnosis not present

## 2016-03-09 DIAGNOSIS — Z95828 Presence of other vascular implants and grafts: Secondary | ICD-10-CM

## 2016-03-09 DIAGNOSIS — T451X5A Adverse effect of antineoplastic and immunosuppressive drugs, initial encounter: Secondary | ICD-10-CM | POA: Insufficient documentation

## 2016-03-09 LAB — COMPREHENSIVE METABOLIC PANEL
ALT: 38 U/L (ref 0–55)
AST: 37 U/L — ABNORMAL HIGH (ref 5–34)
Albumin: 2.6 g/dL — ABNORMAL LOW (ref 3.5–5.0)
Alkaline Phosphatase: 54 U/L (ref 40–150)
Anion Gap: 10 mEq/L (ref 3–11)
BUN: 24.5 mg/dL (ref 7.0–26.0)
CO2: 26 mEq/L (ref 22–29)
Calcium: 8.5 mg/dL (ref 8.4–10.4)
Chloride: 104 mEq/L (ref 98–109)
Creatinine: 1.6 mg/dL — ABNORMAL HIGH (ref 0.6–1.1)
EGFR: 33 mL/min/{1.73_m2} — ABNORMAL LOW (ref >90)
Glucose: 248 mg/dl — ABNORMAL HIGH (ref 70–140)
Potassium: 3.8 mEq/L (ref 3.5–5.1)
Sodium: 140 mEq/L (ref 136–145)
Total Bilirubin: 0.5 mg/dL (ref 0.20–1.20)
Total Protein: 5.5 g/dL — ABNORMAL LOW (ref 6.4–8.3)

## 2016-03-09 LAB — CBC WITH DIFFERENTIAL/PLATELET
BASO%: 2.2 % — ABNORMAL HIGH (ref 0.0–2.0)
Basophils Absolute: 0.2 10*3/uL — ABNORMAL HIGH (ref 0.0–0.1)
EOS%: 5.6 % (ref 0.0–7.0)
Eosinophils Absolute: 0.4 10*3/uL (ref 0.0–0.5)
HCT: 27.6 % — ABNORMAL LOW (ref 34.8–46.6)
HGB: 8.9 g/dL — ABNORMAL LOW (ref 11.6–15.9)
LYMPH%: 14.6 % (ref 14.0–49.7)
MCH: 28.6 pg (ref 25.1–34.0)
MCHC: 32.2 g/dL (ref 31.5–36.0)
MCV: 88.7 fL (ref 79.5–101.0)
MONO#: 0.9 10*3/uL (ref 0.1–0.9)
MONO%: 11.7 % (ref 0.0–14.0)
NEUT#: 4.8 10*3/uL (ref 1.5–6.5)
NEUT%: 65.9 % (ref 38.4–76.8)
Platelets: 194 10*3/uL (ref 145–400)
RBC: 3.11 10*6/uL — ABNORMAL LOW (ref 3.70–5.45)
RDW: 13.7 % (ref 11.2–14.5)
WBC: 7.3 10*3/uL (ref 3.9–10.3)
lymph#: 1.1 10*3/uL (ref 0.9–3.3)

## 2016-03-09 MED ORDER — SODIUM CHLORIDE 0.9 % IJ SOLN
10.0000 mL | INTRAMUSCULAR | Status: DC | PRN
  Administered 2016-03-09: 10 mL via INTRAVENOUS
  Filled 2016-03-09: qty 10

## 2016-03-09 MED ORDER — SODIUM CHLORIDE 0.9 % IV SOLN
Freq: Once | INTRAVENOUS | Status: AC
  Administered 2016-03-09: 12:00:00 via INTRAVENOUS

## 2016-03-09 MED ORDER — SODIUM CHLORIDE 0.9 % IV SOLN
65.0000 mg/m2 | Freq: Once | INTRAVENOUS | Status: AC
  Administered 2016-03-09: 130 mg via INTRAVENOUS
  Filled 2016-03-09: qty 13

## 2016-03-09 MED ORDER — PEGFILGRASTIM 6 MG/0.6ML ~~LOC~~ PSKT
6.0000 mg | PREFILLED_SYRINGE | Freq: Once | SUBCUTANEOUS | Status: AC
  Administered 2016-03-09: 6 mg via SUBCUTANEOUS
  Filled 2016-03-09: qty 0.6

## 2016-03-09 MED ORDER — PALONOSETRON HCL INJECTION 0.25 MG/5ML
0.2500 mg | Freq: Once | INTRAVENOUS | Status: AC
  Administered 2016-03-09: 0.25 mg via INTRAVENOUS

## 2016-03-09 MED ORDER — SODIUM CHLORIDE 0.9% FLUSH
10.0000 mL | INTRAVENOUS | Status: DC | PRN
  Administered 2016-03-09: 10 mL
  Filled 2016-03-09: qty 10

## 2016-03-09 MED ORDER — HEPARIN SOD (PORK) LOCK FLUSH 100 UNIT/ML IV SOLN
500.0000 [IU] | Freq: Once | INTRAVENOUS | Status: AC | PRN
  Administered 2016-03-09: 500 [IU]
  Filled 2016-03-09: qty 5

## 2016-03-09 MED ORDER — CYCLOPHOSPHAMIDE CHEMO INJECTION 1 GM
497.0000 mg/m2 | Freq: Once | INTRAMUSCULAR | Status: AC
  Administered 2016-03-09: 1000 mg via INTRAVENOUS
  Filled 2016-03-09: qty 50

## 2016-03-09 MED ORDER — PALONOSETRON HCL INJECTION 0.25 MG/5ML
INTRAVENOUS | Status: AC
  Filled 2016-03-09: qty 5

## 2016-03-09 NOTE — Patient Instructions (Signed)
[facility] Discharge Instructions for Patients Receiving Chemotherapy  Today you received the following chemotherapy agents Taxotere/Cytoxan.  To help prevent nausea and vomiting after your treatment, we encourage you to take your nausea medication as directed.   If you develop nausea and vomiting that is not controlled by your nausea medication, call the clinic.   BELOW ARE SYMPTOMS THAT SHOULD BE REPORTED IMMEDIATELY:  *FEVER GREATER THAN 100.5 F  *CHILLS WITH OR WITHOUT FEVER  NAUSEA AND VOMITING THAT IS NOT CONTROLLED WITH YOUR NAUSEA MEDICATION  *UNUSUAL SHORTNESS OF BREATH  *UNUSUAL BRUISING OR BLEEDING  TENDERNESS IN MOUTH AND THROAT WITH OR WITHOUT PRESENCE OF ULCERS  *URINARY PROBLEMS  *BOWEL PROBLEMS  UNUSUAL RASH Items with * indicate a potential emergency and should be followed up as soon as possible.  Feel free to call the clinic you have any questions or concerns. The clinic phone number is [phone].  Please show the CHEMO ALERT CARD at check-in to the Emergency Department and triage nurse.    

## 2016-03-09 NOTE — Patient Instructions (Signed)

## 2016-03-09 NOTE — Assessment & Plan Note (Signed)
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 Adjuvant chemotherapy with Taxotere and Cytoxan (4 cycles). Adriamycin is not being given because of her cardiac problems 2. Followed by adjuvant radiation 3 followed by adjuvant antiestrogen therapy (because a satellite tumor is weakly ER and PR positive). --------------------------------------------------------------------------------------------------------------------------------------------------------- Current treatment: Cycle 2 day 1 Taxotere and Cytoxan  Chemotherapy toxicities: 1. Nausea and vomiting: I will decrease the dosage of cycle 2 of chemotherapy 2. Dehydration due to diarrhea: We will give her 1 L of normal saline. We will also add Aloxi. 3. Severe fatigue: Due to dehydration. 4. Hypoalbuminemia: I encouraged her to eat more protein in the diet. 5. Manic episodes: Patient was sent to the emergency room last week and was prescribed Vistaril and trazodone. Patient is not on any dexamethasone. I will refer her to outpatient psychiatry for additional assistance.   Reviewed labs  Monitoring closely for toxicities  Return to clinic in 3 weeks for cycle 3

## 2016-03-09 NOTE — Progress Notes (Signed)
Patient Care Team: Nicola Girt, DO as PCP - General Alphonsa Overall, MD as Consulting Physician (General Surgery) Nicholas Lose, MD as Consulting Physician (Hematology and Oncology) Gery Pray, MD as Consulting Physician (Radiation Oncology)  DIAGNOSIS: Breast cancer of upper-inner quadrant of right female breast [facility])   Staging form: Breast, AJCC 7th Edition   - Clinical stage from 12/21/2015: Stage IA (T1c, N0, M0) - Unsigned         Staging comments: Staged at breast conference on 7.12.17  SUMMARY OF ONCOLOGIC HISTORY:   Breast cancer of upper-inner quadrant of right female breast ([place])   12/14/2015 Initial Diagnosis    Right breast biopsy 12:30 position: 2 masses, 1.6 cm mass: Invasive ductal carcinoma, grade 2, ER 0%, PR 0%, HER-2 negative, Ki-67 70%; satellite mass 8 mm: IDC grade 2 ER 5%, PR 5%, HER-2 negative, Ki-67 50%; T1cN0 stage IA clinical stage      01/18/2016 Surgery    Right lumpectomy: Multifocal IDC grade 3, 1.9 cm  ER 0%, PR 0%, HER-2 negative, Ki-67 70% and 0.8 cm satellite mass ER 5%, PR 2%, HER-2 negative, Ki-67 50%, high-grade DCIS, margins negative, 0/1 lymph nodes negative T1 cN0 stage IA      02/17/2016 -  Anti-estrogen oral therapy    Adjuvant chemotherapy with Taxotere and Cytoxan4       CHIEF COMPLIANT: Cycle 2 Taxotere and Cytoxan  INTERVAL HISTORY: Hannah [name] is a 70 year old with above-mentioned history of right breast cancer treated with lumpectomy and is currently on adjuvant chemotherapy. After cycle 1 patient had multiple problems including mild/moderate nausea and also complained of episodes of mania where she was not able to rest. It appears that that episode has passed. She is still not able to sleep well. She was sent to the emergency room and they prescribed her sedatives but she tells me that did not work and she does not want take them.  REVIEW OF SYSTEMS:   Constitutional: Denies fevers, chills or abnormal weight loss Eyes: Denies  blurriness of vision Ears, nose, mouth, throat, and face: Denies mucositis or sore throat Respiratory: Denies cough, dyspnea or wheezes Cardiovascular: Denies palpitation, chest discomfort Gastrointestinal:  Denies nausea, heartburn or change in bowel habits Skin: Denies abnormal skin rashes Lymphatics: Denies new lymphadenopathy or easy bruising Neurological:Denies numbness, tingling or new weaknesses Behavioral/Psych: Insomnia, anxiety  Extremities: No lower extremity edema Breast:  denies any pain or lumps or nodules in either breasts All other systems were reviewed with the patient and are negative.  I have reviewed the past medical history, past surgical history, social history and family history with the patient and they are unchanged from previous note.  ALLERGIES:  is allergic to amoxicillin; imdur [isosorbide dinitrate]; latex; and tetracycline.  MEDICATIONS:  Current Outpatient Prescriptions  Medication Sig Dispense Refill  . ACCU-CHEK AVIVA PLUS test strip     . alendronate (FOSAMAX) 70 MG tablet Take 70 mg by mouth once a week. Take with a full glass of water on an empty stomach.    Marland Kitchen aspirin EC 81 MG EC tablet Take 1 tablet (81 mg total) by mouth daily.    . budesonide-formoterol (SYMBICORT) 160-4.5 MCG/ACT inhaler INHALE TWO PUFFS INTO LUNGS TWICE DAILY 1 Inhaler 11  . ciprofloxacin (CIPRO) 500 MG tablet Take 1 tablet (500 mg total) by mouth 2 (two) times daily. 14 tablet 0  . clopidogrel (PLAVIX) 75 MG tablet Take 75 mg by mouth daily.    Marland Kitchen HUMALOG 100 UNIT/ML injection  Inject 76 units daily with  V-GO pump 70 mL 2  . HYDROcodone-acetaminophen (NORCO/VICODIN) 5-325 MG tablet Take 1-2 tablets by mouth every 6 (six) hours as needed. (Patient taking differently: Take 1-2 tablets by mouth every 6 (six) hours as needed for moderate pain. ) 30 tablet 0  . hydrOXYzine (ATARAX/VISTARIL) 25 MG tablet Take 1 tablet (25 mg total) by mouth at bedtime as needed and may repeat dose one  time if needed. 12 tablet 0  . lidocaine-prilocaine (EMLA) cream Apply to affected area once 30 g 3  . loratadine (CLARITIN) 10 MG tablet Take 10 mg by mouth daily.    Marland Kitchen LORazepam (ATIVAN) 0.5 MG tablet Take 1 tablet (0.5 mg total) by mouth every 8 (eight) hours as needed for anxiety. 30 tablet 0  . metoprolol succinate (TOPROL-XL) 100 MG 24 hr tablet TAKE ONE TABLET BY MOUTH TWICE DAILY TAKE  WITH  OR  IMMEDIATELY  FOLLOWING  A  MEAL 180 tablet 2  . omeprazole (PRILOSEC OTC) 20 MG tablet Take 20 mg by mouth daily.    . ondansetron (ZOFRAN) 8 MG tablet Take 1 tablet (8 mg total) by mouth 2 (two) times daily as needed for refractory nausea / vomiting. Start on day 3 after chemo. (Patient taking differently: Take 8 mg by mouth 2 (two) times daily. Start on day 3 after chemo.) 30 tablet 1  . prochlorperazine (COMPAZINE) 10 MG tablet Take 1 tablet (10 mg total) by mouth every 6 (six) hours as needed (Nausea or vomiting). 60 tablet 1  . rosuvastatin (CRESTOR) 40 MG tablet TAKE ONE TABLET BY MOUTH ONCE DAILY 30 tablet 11  . sucralfate (CARAFATE) 1 GM/10ML suspension Take 10 mLs (1 g total) by mouth 4 (four) times daily -  with meals and at bedtime. 420 mL 1  . TraMADol HCl 200 MG CP24 Take 1 tablet by mouth daily as needed (for pain).     . traZODone (DESYREL) 50 MG tablet Take 1 tablet (50 mg total) by mouth at bedtime. 14 tablet 1  . TRULICITY 7.61 YW/7.3XT SOPN Inject 0.75 mg into the skin every Wednesday.     Marland Kitchen ZETIA 10 MG tablet Take 1 tablet by mouth  daily 90 tablet 2   No current facility-administered medications for this visit.     PHYSICAL EXAMINATION: ECOG PERFORMANCE STATUS: 1 - Symptomatic but completely ambulatory  Vitals:   03/09/16 1030  BP: 116/68  Pulse: 92  Resp: 19  Temp: 98.5 F (36.9 C)   Filed Weights   03/09/16 1030  Weight: 210 lb (95.3 kg)    GENERAL:alert, no distress and comfortable SKIN: skin color, texture, turgor are normal, no rashes or significant  lesions EYES: normal, Conjunctiva are pink and non-injected, sclera clear OROPHARYNX:no exudate, no erythema and lips, buccal mucosa, and tongue normal  NECK: supple, thyroid normal size, non-tender, without nodularity LYMPH:  no palpable lymphadenopathy in the cervical, axillary or inguinal LUNGS: clear to auscultation and percussion with normal breathing effort HEART: regular rate & rhythm and no murmurs and no lower extremity edema ABDOMEN:abdomen soft, non-tender and normal bowel sounds MUSCULOSKELETAL:no cyanosis of digits and no clubbing  NEURO: alert & oriented x 3 with fluent speech, no focal motor/sensory deficits EXTREMITIES: No lower extremity edema  LABORATORY DATA:  I have reviewed the data as listed   Chemistry      Component Value Date/Time   NA 139 03/05/2016 1454   NA 135 (L) 02/24/2016 0948   K 4.6 03/05/2016 1454  K 4.5 02/24/2016 0948   CL 107 03/05/2016 1454   CO2 26 03/05/2016 1454   CO2 26 02/24/2016 0948   BUN 22 (H) 03/05/2016 1454   BUN 26.1 (H) 02/24/2016 0948   CREATININE 1.67 (H) 03/05/2016 1454   CREATININE 1.5 (H) 02/24/2016 0948      Component Value Date/Time   CALCIUM 7.8 (L) 03/05/2016 1454   CALCIUM 9.1 02/24/2016 0948   ALKPHOS 53 02/24/2016 0948   AST 26 02/24/2016 0948   ALT 17 02/24/2016 0948   BILITOT 0.93 02/24/2016 0948       Lab Results  Component Value Date   WBC 7.3 03/09/2016   HGB 8.9 (L) 03/09/2016   HCT 27.6 (L) 03/09/2016   MCV 88.7 03/09/2016   PLT 194 03/09/2016   NEUTROABS 4.8 03/09/2016     ASSESSMENT & PLAN:  Breast cancer of upper-inner quadrant of right female breast ([place]) Right breast biopsy 12:30 position 12/14/2015: 2 masses, 1.6 cm mass: Invasive ductal carcinoma, grade 2, ER 0%, PR 0%, HER-2 negative, Ki-67 70%; satellite mass 8 mm: IDC grade 2 ER 5%, PR 5%, HER-2 negative, Ki-67 50%; T1cN0 stage IA clinical stage  Right lumpectomy 01/18/2016: Multifocal IDC grade 3, 1.9 cm ER 0%, PR 0%, HER-2  negative, Ki-67 70% and 0.8 cm satellite mass ER 5%, PR 2%, HER-2 negative, Ki-67 50%, high-grade DCIS, margins negative, 0/1 lymph nodes negative T1 cN0 stage IA  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 Adjuvant chemotherapy with Taxotere and Cytoxan (4 cycles). Adriamycin is not being given because of her cardiac problems 2. Followed by adjuvant radiation 3 followed by adjuvant antiestrogen therapy (because a satellite tumor is weakly ER and PR positive). --------------------------------------------------------------------------------------------------------------------------------------------------------- Current treatment: Cycle 2 day 1 Taxotere and Cytoxan  Chemotherapy toxicities: 1. Nausea and vomiting: We decreased the dosage of cycle 2 of chemotherapy 2. Dehydration due to diarrhea: We will give her 1 L of normal saline. We will also add Aloxi. 3. Severe fatigue: Marked improvement 4. Hypoalbuminemia: Patient is working very hard to eat more protein in the diet. 5. Manic episodes: Patient was sent to the emergency room last week and was prescribed Vistaril and trazodone. She does not want to take them anymore because her main problem is insomnia. I instructed her to double the Ativan. Patient is not on any dexamethasone.  6. Severe anemia: Due to chemotherapy will be monitored.   Reviewed labs  Monitoring closely for toxicities  Return to clinic in 3 weeks for cycle 3   No orders of the defined types were placed in this encounter.  The patient has a good understanding of the overall plan. she agrees with it. she will call with any problems that may develop before the next visit here.   Rulon Eisenmenger, MD 03/09/16

## 2016-03-09 NOTE — Progress Notes (Signed)
Reviewed labs with Dr. Lindi Adie and okay given to proceed with treatment despite creatinine 1.6.

## 2016-03-12 ENCOUNTER — Other Ambulatory Visit: Payer: Self-pay | Admitting: *Deleted

## 2016-03-12 MED ORDER — LORAZEPAM 1 MG PO TABS: 1.0000 mg | ORAL_TABLET | Freq: Every day | ORAL | 3 refills | Status: DC

## 2016-03-14 ENCOUNTER — Other Ambulatory Visit: Payer: Self-pay | Admitting: Hematology and Oncology

## 2016-03-14 DIAGNOSIS — E860 Dehydration: Secondary | ICD-10-CM

## 2016-03-16 ENCOUNTER — Other Ambulatory Visit: Payer: Self-pay

## 2016-03-16 DIAGNOSIS — C50211 Malignant neoplasm of upper-inner quadrant of right female breast: Secondary | ICD-10-CM

## 2016-03-16 MED ORDER — PROCHLORPERAZINE MALEATE 10 MG PO TABS: 10.0000 mg | ORAL_TABLET | Freq: Four times a day (QID) | ORAL | 1 refills | Status: DC | PRN

## 2016-03-20 NOTE — Progress Notes (Signed)
CARDIOLOGY OFFICE NOTE  Date:  03/21/2016    Hannah [name] Date of Birth: [birth year] Medical Record #[id number]  PCP:  Nicola Girt, DO  Cardiologist:  Annabell Howells    Chief Complaint  Patient presents with  . Coronary Artery Disease    6 month check - seen for Dr. Aundra Dubin    History of Present Illness: Hannah [name] is a 70 y.o. female who presents today for a 6 month check. Seen for Dr. Aundra Dubin. She is a former patient of Dr. Susa Simmonds.   She has a history of CAD s/p CABG, bioprosthetic AVR, CKD, paroxysmal atrial fibrillation presents for cardiology followup.  She had her initial CABG in 1998, then had redo CABG in 2009 with bioprosthetic AVR.  She does not remember when she last had documented atrial fibrillation, but she thinks it was a number of years ago, sounds peri-operative around the time of her redo CABG.  She was never put on anticoagulation but had been on dronedarone.    In 9/14, she developed tachypalpitations and later chest pain.  In the ER, SBP was in the 200s with elevated HR. Troponin was mildly elevated.  Therefore, she had a cardiac cath showing patent grafts.  She was thought to have had demand ischemia in the setting of hypertensive emergency + tachycardia.  After discharge, she continued to have runs of tachypalpitations.  A 3 week event monitor was put on her.  This showed runs, sometimes long, of atrial tachycardia. She was started on Toprol XL when these runs were noted.  No further palpitations (complete resolution of symptoms).    In 1/15, she was admitted to [facility] with atypical chest pain.  She was ruled out for MI and Lexiscan Cardiolite showed no ischemia.  She was started on Imdur and sent home.  After discharge, she developed a daily headache and nausea.  She did not eat or drink much due to the nausea. Shortly after this, she was standing in her grandson's pediatrician's office and became severely lightheaded.  She had to sit down.   She came to the ER and was found to be orthostatic.  She was given IV fluid and got better quickly.  The Imdur was stopped and symptoms seem to have completely resolved.    Event monitor in 8/16 showed atrial tachycardia versus atypical atrial flutter.  Toprol XL was increased to 100 mg bid.  She saw Dr Lovena Le who thought that she had short runs of atrial tachycardia and not atrial flutter.  Therefore, she has not been anticoagulated. She hvaing occasional brief palpitations but overall was felt to be doing better.   She saw Dr. Aundra Dubin back in April - she was doing well.   Has been diagnosed with breast cancer since her last visit here. Has had surgery. Getting chemo/radiation - no Adriamycin due to her history of CHF.  Comes back today. Here with her husband. She is doing ok. Has good days and bad days from the chemo but she is "making it". No chest pain. Rhythm has been ok - she will continue to have some palpitations - but sounds like it is all the same. She does have more edema - from the chemo - taking Lasix. Followed closely by oncology. Some shortness of breath. Recent labs noted. She notes she is trying to do more in regards to activity. She has 2 more rounds of chemo and then will have 33 radiation treatments.   PMH: 1.  Diabetes mellitus 2. HTN 3. Obesity 4. Atrial arrhythmias: Paroxysmal atrial fibrillation seems to have been post-operative after CAD-AVR. She has a history of amiodarone toxicity. She has not been on coumadin.  3-week event monitor (9/13) with no atrial fibrillation.  3-week event monitor (10/14) with runs of probable atrial tachycardia.  30 day monitor in 8/16 with atrial tachycardia (multiple episodes).   5. CAD: CABG 1998 with LIMA-LAD/diagonal, sequential SVG-OM1/OM2, SVG-RCA.  Redo CABG 2009 with SVG-OM3, SVG-PDA.  Lexiscan myoview (9/13): EF 61%, no ischemia or infarction.  LHC (10/14) with all grafts patent except the old SVG-RCA.  There was 90% stenosis in the AV  LCx before ungrafted MOM, but this was unchanged from prior study.  Lexiscan Cardiolite (1/15) with no ischemia. She does not tolerate Imdur.  6. CKD 7. Bioprosthetic aortic valve: Echo (2011) with EF 50-55%, bioprosthetic aortic valve well-seated.  Echo (2/13) with EF 60-65%, bioprosthetic aortic valve looked ok.  Echo (9/14): EF 50-55%, mild LVH, bioprosthetic AVR with mean gradient 10, mild MR, moderate TR.  8. Asthma: since her teens. 9. Low back pain.  10. 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  Per neurologist, did not appear cardioembolic.   11. Carotid stenosis: 40-59% bilateral ICA stenosis on 10/14 carotid dopplers. Carotid doppler (11/15) with 40-59% bilateral ICA stenosis.  Carotid dopplers (11/16) with 40-59% BICA stenosis.  12. Sleep study 12/14 without significant OSA.  13. Chronic diastolic CHF   Past Medical History:  Diagnosis Date  . Abnormally small mouth   . Anemia   . Bilateral carotid artery stenosis    Bilateral ICA 40-59%/  >50% LECA  . Breast cancer ([place])   . Breast cancer of upper-inner quadrant of right female breast ([place]) 12/16/2015  . Bruises easily   . CAD (coronary artery disease) cardiologist-  dr Aundra Dubin   Remote CABG in 1998 with redo in 2009  . CHF (congestive heart failure) (H[facility])    EF is low normal at 50 to 55% per echo in Jan. 2011  . Dyspnea   . GERD (gastroesophageal reflux disease)   . Hearing loss of right ear   . History of non-ST elevation myocardial infarction (NSTEMI)    Sept 2014--  thought to be type II HTN w/ LHC without infarct related artery and patent grafts  . Hyperlipidemia   . Hypertension   . Iron deficiency anemia   . Moderate persistent asthma    pulmologist-  Dr. Malvin Johns  . Nausea & vomiting    related to stone  . Nonischemic cardiomyopathy ([place])   . PAF (paroxysmal atrial fibrillation) ([place])   . PONV (postoperative nausea and vomiting)    pt states patch worked well before  . Psoriasis    right leg  . Renal  calculus, right   . Renal insufficiency, mild   . S/P AVR    prosthesis valve placement 2009 at same time re-do CABG  . Stroke [facility])    residual rt hearing loss  . Type 2 diabetes mellitus ([place])    monitored by dr Dwyane Dee    Past Surgical History:  Procedure Laterality Date  . AORTIC VALVE REPLACEMENT  2009   #52mm [facility] Ease pericardial valve (done same time is CABG)  . BREAST LUMPECTOMY WITH RADIOACTIVE SEED AND SENTINEL LYMPH NODE BIOPSY Right 01/18/2016   Procedure: RIGHT BREAST LUMPECTOMY WITH RADIOACTIVE SEED AND SENTINEL LYMPH NODE BIOPSY;  Surgeon: Alphonsa Overall, MD;  Location: [facility];  Service: General;  Laterality: Right;  .  CARDIAC CATHETERIZATION  03/23/2008   Pre-redo CABG: L main OK, LAD (T), CFX (T), OM1 99%, RCA (T), LIMA-LAD OK, SVG-OM(?3) OK w/ little florw to OM2, SVG-RCA OK. EF NL  . CARDIOVASCULAR STRESS TEST  06-15-2013      dr Debara Pickett   [facility] perfusion study/  no evidence ischemia/  normal wall motion, ef 72%  . CARPAL TUNNEL RELEASE    . COLONOSCOPY    . CORONARY ARTERY BYPASS GRAFT  1998 &  re-do 2009   Had LIMA to DX/LAD, SVG to 2 marginal branches and SVG to [facility] originally; SVG to 3rd OM and PD at time of redo  . CYSTOSCOPY W/ URETERAL STENT PLACEMENT Right 12/20/2014   Procedure: CYSTOSCOPY WITH RETROGRADE PYELOGRAM/URETERAL STENT PLACEMENT;  Surgeon: Cleon Gustin, MD;  Location[facility];  Service: Urology;  Laterality: Right;  . EYE SURGERY Bilateral    cataracts  . HOLMIUM LASER APPLICATION Right 5/63/1497   Procedure:  HOLMIUM LASER LITHOTRIPSY;  Surgeon: Cleon Gustin, MD;  Location: [facility];  Service: Urology;  Laterality: Right;  . LEFT HEART CATHETERIZATION WITH CORONARY/GRAFT ANGIOGRAM N/A 02/23/2013   Procedure: LEFT HEART CATHETERIZATION WITH Beatrix Fetters;  Surgeon: Blane Ohara, MD;  Location: [facility] CATH LAB;  Service: Cardiovascular;  Laterality: N/A;  . PORTACATH PLACEMENT Left  01/18/2016   Procedure: INSERTION PORT-A-CATH;  Surgeon: Alphonsa Overall, MD;  Location: [place];  Service: General;  Laterality: Left;  . TONSILLECTOMY    . TRANSTHORACIC ECHOCARDIOGRAM  02-24-2013      mild LVH,  ef 50-55%/  AV bioprosthesis was present with very mild stenosis and no regurg., mean grandient 34mmHg, peak grandient 85mmHg /  mild MR/  mild LAE and RAE/  moderate TR  . TUBAL LIGATION       Medications: Current Outpatient Prescriptions  Medication Sig Dispense Refill  . ACCU-CHEK AVIVA PLUS test strip     . alendronate (FOSAMAX) 70 MG tablet Take 70 mg by mouth once a week. Take with a full glass of water on an empty stomach.    Marland Kitchen aspirin EC 81 MG EC tablet Take 1 tablet (81 mg total) by mouth daily.    . budesonide-formoterol (SYMBICORT) 160-4.5 MCG/ACT inhaler INHALE TWO PUFFS INTO LUNGS TWICE DAILY 1 Inhaler 11  . clopidogrel (PLAVIX) 75 MG tablet Take 75 mg by mouth daily.    . furosemide (LASIX) 40 MG tablet Take 60 mg by mouth daily.    Marland Kitchen HUMALOG 100 UNIT/ML injection Inject 76 units daily with  V-GO pump 70 mL 2  . lidocaine-prilocaine (EMLA) cream Apply to affected area once 30 g 3  . loratadine (CLARITIN) 10 MG tablet Take 10 mg by mouth daily.    Marland Kitchen LORazepam (ATIVAN) 2 MG tablet Take 2 mg by mouth at bedtime.    . metoprolol succinate (TOPROL-XL) 100 MG 24 hr tablet TAKE ONE TABLET BY MOUTH TWICE DAILY TAKE  WITH  OR  IMMEDIATELY  FOLLOWING  A  MEAL 180 tablet 2  . omeprazole (PRILOSEC OTC) 20 MG tablet Take 20 mg by mouth daily.    . ondansetron (ZOFRAN) 8 MG tablet TAKE 1 TABLET BY MOUTH 2  TIMES DAILY AS NEEDED FOR  REFRACTORY NAUSEA /  VOMITING. START ON DAY 3  AFTER CHEMO. 60 tablet 0  . prochlorperazine (COMPAZINE) 10 MG tablet Take 1 tablet (10 mg total) by mouth every 6 (six) hours as needed (Nausea or vomiting). 60 tablet 1  . rosuvastatin (  CRESTOR) 40 MG tablet TAKE ONE TABLET BY MOUTH ONCE DAILY 30 tablet 11  . sucralfate (CARAFATE) 1 GM/10ML suspension Take 10  mLs (1 g total) by mouth 4 (four) times daily -  with meals and at bedtime. 420 mL 1  . TraMADol HCl 200 MG CP24 Take 1 tablet by mouth daily as needed (for pain).     . TRULICITY 1.60 FU/9.3AT SOPN Inject 0.75 mg into the skin every Wednesday.     Marland Kitchen ZETIA 10 MG tablet Take 1 tablet by mouth  daily 90 tablet 2   No current facility-administered medications for this visit.     Allergies: Allergies  Allergen Reactions  . Amoxicillin Rash    Has patient had a PCN reaction causing immediate rash, facial/tongue/throat swelling, SOB or lightheadedness with hypotension: No Has patient had a PCN reaction causing severe rash involving mucus membranes or skin necrosis: Yes Has patient had a PCN reaction that required hospitalization No Has patient had a PCN reaction occurring within the last 10 years: No If all of the above answers are "NO", then may proceed with Cephalosporin use.   . Imdur [Isosorbide Dinitrate] Other (See Comments)    Headache/severe hypotension/Syncope  . Latex Itching  . Tetracycline Rash    Social History: The patient  reports that she has never smoked. She has never used smokeless tobacco. She reports that she does not drink alcohol or use drugs.   Family History: The patient's family history includes Diabetes in her maternal grandmother and son; Heart disease in her father and mother; Heart failure in her father.   Review of Systems: Please see the history of present illness.   Otherwise, the review of systems is positive for none.   All other systems are reviewed and negative.   Physical Exam: VS:  BP (!) 110/54   Pulse 84   Ht 5\' 5"  (1.651 m)   Wt 198 lb (89.8 kg)   SpO2 99%   BMI 32.95 kg/m  .  BMI Body mass index is 32.95 kg/m.  Wt Readings from Last 3 Encounters:  03/21/16 198 lb (89.8 kg)  03/09/16 210 lb (95.3 kg)  03/05/16 201 lb 9.6 oz (91.4 kg)    General: Pleasant. She looks chronically ill. Color is sallow. Alert and in no acute distress.   Remains obese.  HEENT: Normal.  Neck: Supple, no JVD, carotid bruits, or masses noted.  Cardiac: Regular rate and rhythm. No murmurs, rubs, or gallops. Over 1+ edema.  Respiratory:  Lungs are fairly clear to auscultation bilaterally with normal work of breathing.  GI: Soft and nontender.  MS: No deformity or atrophy. Gait and ROM intact.  Skin: Warm and dry. Color is sallow. Neuro:  Strength and sensation are intact and no gross focal deficits noted.  Psych: Alert, appropriate and with normal affect.   LABORATORY DATA:  EKG:  EKG is not ordered today.  Lab Results  Component Value Date   WBC 7.3 03/09/2016   HGB 8.9 (L) 03/09/2016   HCT 27.6 (L) 03/09/2016   PLT 194 03/09/2016   GLUCOSE 248 (H) 03/09/2016   CHOL 116 (L) 07/05/2015   TRIG 99 07/05/2015   HDL 38 (L) 07/05/2015   LDLDIRECT 109.2 03/01/2014   LDLCALC 58 07/05/2015   ALT 38 03/09/2016   AST 37 (H) 03/09/2016   NA 140 03/09/2016   K 3.8 03/09/2016   CL 107 03/05/2016   CREATININE 1.6 (H) 03/09/2016   BUN 24.5 03/09/2016   CO2  26 03/09/2016   TSH 2.11 07/09/2014   INR 1.06 06/15/2013   HGBA1C 6.5 01/05/2016   MICROALBUR 24.3 (H) 09/19/2015    BNP (last 3 results) No results for input(s): BNP in the last 8760 hours.  ProBNP (last 3 results) No results for input(s): PROBNP in the last 8760 hours.   Other Studies Reviewed Today:  Echo Study Conclusions from 10/2015  - Left ventricle: The cavity size was normal. There was mild focal   basal hypertrophy of the septum. Systolic function was vigorous.   The estimated ejection fraction was in the range of 65% to 70%.   Wall motion was normal; there were no regional wall motion   abnormalities. Doppler parameters are consistent with abnormal   left ventricular relaxation (grade 1 diastolic dysfunction). - Aortic valve: Bioprosthetic valve is well seated and functioning   normally. - Mitral valve: Calcified annulus. - Left atrium: The atrium was mildly  dilated.  MYOVIEW FINDINGS FROM 06/2013: Normal resting EKG. Slight ST depressions noted in aVL after administration of lexiscan. Mild shortness of breath and nausea resolved after the test. EKG is nondiagnostic for ischemia. TID ratio 1.05. Lung-heart ratio 0.43. Normal ventricular chamber size.  IMPRESSION: No evidence for ischemia.  Normal wall motion.  LVEF 72%.   Electronically Signed   By: Guy Sandifer   On: 06/15/2013 14:27    Assessment/Plan:  1. CAD  Admission in 9/14 with mild troponin elevation.  LHC showed stable anatomy with patent grafts except for SVG-RCA from older CABG.  Suspect that the troponin elevation was due to demand ischemia in the setting of hypertensive emergency and tachycardia (possible run of atrial tachycardia).  Admission in 1/15 with atypical chest pain, had Lexiscan Cardiolite that showed no ischemia.  No recent chest pain.  - Continue ASA 81, Plavix, statin, Toprol XL.      2. Atrial Arrhythmias Patient has not been on anticoagulation. She apparently had atrial fibrillation at some point in the remote past. It seems that the atrial fibrillation may have been post-op after redo CABG and has not recurred. She remains in NSR today. She has been noted to have atrial tachycardia but no atrial fibrillation or flutter.  Palpitations have been better with beta blocker therapy and appears stable.   3. HYPERLIPIDEMIA  She in on statin therapy   4. Bioprosthetic aortic valve Most recent echo from May looks ok.   5. Carotid bruit She is to have repeat dopplers later in the fall - has a lot going on with her breast cancer - will arrange on follow up visit.     6. Chronic diastolic CHF More swelling - most likely from her chemo - back on her lasix.     7. CKD  8. Breast cancer - currently getting chemowith plans for radiation to follow  Current medicines are reviewed with the patient today.  The patient does not have concerns regarding medicines  other than what has been noted above.  The following changes have been made:  See above.  Labs/ tests ordered today include:   No orders of the defined types were placed in this encounter.    Disposition:   FU with me in 3 months.   Patient is agreeable to this plan and will call if any problems develop in the interim.   Signed: [name], RN, ANP-C 03/21/2016 2:25 PM  [facility] [address] [place], [place]  [address] Phone: [phone]  Fax: [phone]

## 2016-03-21 ENCOUNTER — Encounter: Payer: Self-pay | Admitting: Nurse Practitioner

## 2016-03-21 ENCOUNTER — Ambulatory Visit (INDEPENDENT_AMBULATORY_CARE_PROVIDER_SITE_OTHER): Payer: Medicare Other | Admitting: Nurse Practitioner

## 2016-03-21 VITALS — BP 110/54 | HR 84 | Ht 65.0 in | Wt 198.0 lb

## 2016-03-21 DIAGNOSIS — Z952 Presence of prosthetic heart valve: Secondary | ICD-10-CM | POA: Diagnosis not present

## 2016-03-21 DIAGNOSIS — I5032 Chronic diastolic (congestive) heart failure: Secondary | ICD-10-CM

## 2016-03-21 DIAGNOSIS — I10 Essential (primary) hypertension: Secondary | ICD-10-CM

## 2016-03-21 DIAGNOSIS — E7800 Pure hypercholesterolemia, unspecified: Secondary | ICD-10-CM

## 2016-03-21 DIAGNOSIS — I471 Supraventricular tachycardia: Secondary | ICD-10-CM

## 2016-03-21 DIAGNOSIS — I2510 Atherosclerotic heart disease of native coronary artery without angina pectoris: Secondary | ICD-10-CM | POA: Diagnosis not present

## 2016-03-21 NOTE — Patient Instructions (Addendum)
We will be checking the following labs today - NONE   Medication Instructions:    Continue with your current medicines.     Testing/Procedures To Be Arranged:  N/A  Follow-Up:   See me in 3 months    Other Special Instructions:   N/A    If you need a refill on your cardiac medications before your next appointment, please call your pharmacy.   Call the [facility] office at [phone] if you have any questions, problems or concerns.      

## 2016-03-26 ENCOUNTER — Other Ambulatory Visit: Payer: Self-pay | Admitting: *Deleted

## 2016-03-26 MED ORDER — GABAPENTIN 300 MG PO CAPS: 300.0000 mg | ORAL_CAPSULE | Freq: Three times a day (TID) | ORAL | 3 refills | Status: DC

## 2016-03-26 MED ORDER — GABAPENTIN 100 MG PO CAPS: 100.0000 mg | ORAL_CAPSULE | Freq: Three times a day (TID) | ORAL | 0 refills | Status: DC

## 2016-03-26 NOTE — Telephone Encounter (Signed)
Pt called in with c/o lower leg pain. Relate she is still taking her Lasix a prescribed. Having some difficulty with "walking like I usually do b/c my legs hurt so bad". Discussed complaints with Dr. Lindi Adie. Prescribed Gabapentin starting with 100mg  TID increasing to 300mg  TID. Called prescription into pts pharmacy. Called pt with specific instructions on how to take her new medication. Received verbal response from pt on how to take her medication. Encourage pt to call with needs or further questions. Received verbal understanding.

## 2016-03-30 ENCOUNTER — Other Ambulatory Visit (HOSPITAL_BASED_OUTPATIENT_CLINIC_OR_DEPARTMENT_OTHER): Payer: Medicare Other

## 2016-03-30 ENCOUNTER — Ambulatory Visit (HOSPITAL_COMMUNITY)
Admission: RE | Admit: 2016-03-30 | Discharge: 2016-03-30 | Disposition: A | Discharge status: Home / Self Care | Payer: Medicare Other | Source: Ambulatory Visit | Attending: Hematology and Oncology | Admitting: Hematology and Oncology

## 2016-03-30 ENCOUNTER — Ambulatory Visit (HOSPITAL_BASED_OUTPATIENT_CLINIC_OR_DEPARTMENT_OTHER): Payer: Medicare Other | Admitting: Hematology and Oncology

## 2016-03-30 ENCOUNTER — Ambulatory Visit: Payer: Medicare Other | Admitting: Nurse Practitioner

## 2016-03-30 ENCOUNTER — Encounter: Payer: Self-pay | Admitting: Nurse Practitioner

## 2016-03-30 ENCOUNTER — Ambulatory Visit (HOSPITAL_BASED_OUTPATIENT_CLINIC_OR_DEPARTMENT_OTHER): Payer: Medicare Other

## 2016-03-30 ENCOUNTER — Ambulatory Visit: Payer: Medicare Other

## 2016-03-30 ENCOUNTER — Other Ambulatory Visit: Payer: Self-pay | Admitting: Nurse Practitioner

## 2016-03-30 DIAGNOSIS — D6481 Anemia due to antineoplastic chemotherapy: Secondary | ICD-10-CM

## 2016-03-30 DIAGNOSIS — E860 Dehydration: Secondary | ICD-10-CM

## 2016-03-30 DIAGNOSIS — T451X5A Adverse effect of antineoplastic and immunosuppressive drugs, initial encounter: Secondary | ICD-10-CM

## 2016-03-30 DIAGNOSIS — C50211 Malignant neoplasm of upper-inner quadrant of right female breast: Secondary | ICD-10-CM

## 2016-03-30 DIAGNOSIS — Z5111 Encounter for antineoplastic chemotherapy: Secondary | ICD-10-CM

## 2016-03-30 DIAGNOSIS — G4700 Insomnia, unspecified: Secondary | ICD-10-CM | POA: Diagnosis not present

## 2016-03-30 DIAGNOSIS — R197 Diarrhea, unspecified: Secondary | ICD-10-CM

## 2016-03-30 DIAGNOSIS — Z95828 Presence of other vascular implants and grafts: Secondary | ICD-10-CM

## 2016-03-30 DIAGNOSIS — Z170 Estrogen receptor positive status [ER+]: Secondary | ICD-10-CM | POA: Diagnosis not present

## 2016-03-30 LAB — CBC WITH DIFFERENTIAL/PLATELET
BASO%: 1.4 % (ref 0.0–2.0)
Basophils Absolute: 0.1 10*3/uL (ref 0.0–0.1)
EOS%: 0.6 % (ref 0.0–7.0)
Eosinophils Absolute: 0 10*3/uL (ref 0.0–0.5)
HCT: 23.2 % — ABNORMAL LOW (ref 34.8–46.6)
HGB: 7.3 g/dL — ABNORMAL LOW (ref 11.6–15.9)
LYMPH%: 10.2 % — ABNORMAL LOW (ref 14.0–49.7)
MCH: 28.9 pg (ref 25.1–34.0)
MCHC: 31.6 g/dL (ref 31.5–36.0)
MCV: 91.5 fL (ref 79.5–101.0)
MONO#: 0.9 10*3/uL (ref 0.1–0.9)
MONO%: 13.7 % (ref 0.0–14.0)
NEUT#: 4.7 10*3/uL (ref 1.5–6.5)
NEUT%: 74.1 % (ref 38.4–76.8)
Platelets: 152 10*3/uL (ref 145–400)
RBC: 2.54 10*6/uL — ABNORMAL LOW (ref 3.70–5.45)
RDW: 16 % — ABNORMAL HIGH (ref 11.2–14.5)
WBC: 6.3 10*3/uL (ref 3.9–10.3)
lymph#: 0.6 10*3/uL — ABNORMAL LOW (ref 0.9–3.3)

## 2016-03-30 LAB — COMPREHENSIVE METABOLIC PANEL
ALT: 28 U/L (ref 0–55)
AST: 39 U/L — ABNORMAL HIGH (ref 5–34)
Albumin: 2.4 g/dL — ABNORMAL LOW (ref 3.5–5.0)
Alkaline Phosphatase: 54 U/L (ref 40–150)
Anion Gap: 7 mEq/L (ref 3–11)
BUN: 22.7 mg/dL (ref 7.0–26.0)
CO2: 27 mEq/L (ref 22–29)
Calcium: 8.5 mg/dL (ref 8.4–10.4)
Chloride: 106 mEq/L (ref 98–109)
Creatinine: 1.1 mg/dL (ref 0.6–1.1)
EGFR: 50 mL/min/{1.73_m2} — ABNORMAL LOW (ref >90)
Glucose: 189 mg/dl — ABNORMAL HIGH (ref 70–140)
Potassium: 4.2 mEq/L (ref 3.5–5.1)
Sodium: 141 mEq/L (ref 136–145)
Total Bilirubin: 0.66 mg/dL (ref 0.20–1.20)
Total Protein: 5.3 g/dL — ABNORMAL LOW (ref 6.4–8.3)

## 2016-03-30 LAB — ABO/RH: ABO/RH(D): O POS

## 2016-03-30 LAB — PREPARE RBC (CROSSMATCH)

## 2016-03-30 MED ORDER — SODIUM CHLORIDE 0.9 % IV SOLN
495.0000 mg/m2 | Freq: Once | INTRAVENOUS | Status: AC
  Administered 2016-03-30: 1000 mg via INTRAVENOUS
  Filled 2016-03-30: qty 50

## 2016-03-30 MED ORDER — BUMETANIDE 1 MG PO TABS: 1.0000 mg | ORAL_TABLET | Freq: Every day | ORAL | 2 refills | Status: DC

## 2016-03-30 MED ORDER — HEPARIN SOD (PORK) LOCK FLUSH 100 UNIT/ML IV SOLN
500.0000 [IU] | Freq: Once | INTRAVENOUS | Status: AC | PRN
  Administered 2016-03-30: 500 [IU]
  Filled 2016-03-30: qty 5

## 2016-03-30 MED ORDER — PEGFILGRASTIM 6 MG/0.6ML ~~LOC~~ PSKT
6.0000 mg | PREFILLED_SYRINGE | Freq: Once | SUBCUTANEOUS | Status: AC
  Administered 2016-03-30: 6 mg via SUBCUTANEOUS
  Filled 2016-03-30: qty 0.6

## 2016-03-30 MED ORDER — SODIUM CHLORIDE 0.9 % IV SOLN
65.0000 mg/m2 | Freq: Once | INTRAVENOUS | Status: AC
  Administered 2016-03-30: 130 mg via INTRAVENOUS
  Filled 2016-03-30: qty 13

## 2016-03-30 MED ORDER — PALONOSETRON HCL INJECTION 0.25 MG/5ML
0.2500 mg | Freq: Once | INTRAVENOUS | Status: AC
  Administered 2016-03-30: 0.25 mg via INTRAVENOUS

## 2016-03-30 MED ORDER — SODIUM CHLORIDE 0.9 % IJ SOLN
10.0000 mL | INTRAMUSCULAR | Status: DC | PRN
  Administered 2016-03-30: 10 mL via INTRAVENOUS
  Filled 2016-03-30: qty 10

## 2016-03-30 MED ORDER — SODIUM CHLORIDE 0.9 % IV SOLN
Freq: Once | INTRAVENOUS | Status: AC
  Administered 2016-03-30: 13:00:00 via INTRAVENOUS

## 2016-03-30 MED ORDER — PALONOSETRON HCL INJECTION 0.25 MG/5ML
INTRAVENOUS | Status: AC
  Filled 2016-03-30: qty 5

## 2016-03-30 MED ORDER — SODIUM CHLORIDE 0.9% FLUSH
10.0000 mL | INTRAVENOUS | Status: DC | PRN
  Administered 2016-03-30: 10 mL
  Filled 2016-03-30: qty 10

## 2016-03-30 MED ORDER — LORAZEPAM 2 MG PO TABS: 1.0000 mg | ORAL_TABLET | Freq: Every day | ORAL | 1 refills | Status: DC

## 2016-03-30 NOTE — Assessment & Plan Note (Signed)
Patient received cycle 3 of her TC chemotherapy regimen today.  Labs obtained earlier today reveal hemoglobin dropped from 8.9 down to 7.3; but patient was asymptomatic and had denied all fatigue and shortness of breath.  However, once patient had completed all of her chemotherapy today-patient complained of significant short no soap breath and fatigue when she made a quick trip to the bathroom.  She denied any actual chest pain; but did feel there was some tightness in her chest for just a few seconds.  All vital signs remained stable throughout; and patient was afebrile.  Patient was noted have clear breath sounds bilaterally; and had a normal heart rhythm.  Patient was in no acute distress whatsoever.  Reviewed all findings with Dr. Irene Limbo; and he recommended that patient proceed with a blood transfusion first thing tomorrow morning for treatment of symptomatic anemia.  Also, patient was advised to go directly to the emergency department over the weekend for any worsening symptoms whatsoever.

## 2016-03-30 NOTE — Progress Notes (Signed)
SYMPTOM MANAGEMENT CLINIC    Chief Complaint: Symptomatic anemia,  HPI:  Hannah [name] 70 y.o. female diagnosed with breast cancer.  Currently undergoing TC chemotherapy regimen.     Breast cancer of upper-inner quadrant of right female breast ([place])   12/14/2015 Initial Diagnosis    Right breast biopsy 12:30 position: 2 masses, 1.6 cm mass: Invasive ductal carcinoma, grade 2, ER 0%, PR 0%, HER-2 negative, Ki-67 70%; satellite mass 8 mm: IDC grade 2 ER 5%, PR 5%, HER-2 negative, Ki-67 50%; T1cN0 stage IA clinical stage      01/18/2016 Surgery    Right lumpectomy: Multifocal IDC grade 3, 1.9 cm  ER 0%, PR 0%, HER-2 negative, Ki-67 70% and 0.8 cm satellite mass ER 5%, PR 2%, HER-2 negative, Ki-67 50%, high-grade DCIS, margins negative, 0/1 lymph nodes negative T1 cN0 stage IA      02/17/2016 -  Anti-estrogen oral therapy    Adjuvant chemotherapy with Taxotere and Cytoxan4       Review of Systems  Constitutional: Positive for malaise/fatigue.  Respiratory: Positive for shortness of breath.   All other systems reviewed and are negative.   Past Medical History:  Diagnosis Date  . Abnormally small mouth   . Anemia   . Bilateral carotid artery stenosis    Bilateral ICA 40-59%/  >50% LECA  . Breast cancer ([place])   . Breast cancer of upper-inner quadrant of right female breast ([place]) 12/16/2015  . Bruises easily   . CAD (coronary artery disease) cardiologist-  dr Aundra Dubin   Remote CABG in 1998 with redo in 2009  . CHF (congestive heart failure) (HCC)    EF is low normal at 50 to 55% per echo in Jan. 2011  . Dyspnea   . GERD (gastroesophageal reflux disease)   . Hearing loss of right ear   . History of non-ST elevation myocardial infarction (NSTEMI)    Sept 2014--  thought to be type II HTN w/ LHC without infarct related artery and patent grafts  . Hyperlipidemia   . Hypertension   . Iron deficiency anemia   . Moderate persistent asthma    pulmologist-  Dr. Malvin Johns  . Nausea &  vomiting    related to stone  . Nonischemic cardiomyopathy ([place])   . PAF (paroxysmal atrial fibrillation) ([place])   . PONV (postoperative nausea and vomiting)    pt states patch worked well before  . Psoriasis    right leg  . Renal calculus, right   . Renal insufficiency, mild   . S/P AVR    prosthesis valve placement 2009 at same time re-do CABG  . 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 Type 2 diabetes mellitus ([place])    monitored by dr Dwyane Dee    Past Surgical History:  Procedure Laterality Date  . AORTIC VALVE REPLACEMENT  2009   [facility] same time is [facility])  . BREAST LUMPECTOMY WITH RADIOACTIVE SEED AND SENTINEL LYMPH NODE BIOPSY Right 01/18/2016   Procedure: RIGHT BREAST LUMPECTOMY WITH RADIOACTIVE SEED AND SENTINEL LYMPH NODE BIOPSY;  Surgeon: DAlphonsa Overall MD;  [place]  Service: General;  Laterality: Right;  . CARDIAC CATHETERIZATION  03/23/2008   Pre-redo CABG: L main OK, LAD (T), CFX (T), OM1 99%, RCA (T), LIMA-LAD OK, SVG-OM(?3) OK w/ little florw to OM2, SVG-RCA OK. EF NL  . CARDIOVASCULAR STRESS TEST  06-15-2013      dr hDebara Pickett  [facility]  study/  no evidence ischemia/  normal wall motion, ef 72%  . CARPAL TUNNEL RELEASE    . COLONOSCOPY    . CORONARY ARTERY BYPASS GRAFT  1998 &  re-do 2009   Had LIMA to DX/LAD, SVG to 2 marginal branches and SVG to [facility] originally; SVG to 3rd OM and PD at time of redo  . CYSTOSCOPY W/ URETERAL STENT PLACEMENT Right 12/20/2014   Procedure: CYSTOSCOPY WITH RETROGRADE PYELOGRAM/URETERAL STENT PLACEMENT;  Surgeon: Cleon Gustin, MD;  Location: [facility];  Service: Urology;  Laterality: Right;  . EYE SURGERY Bilateral    cataracts  . HOLMIUM LASER APPLICATION Right 12/04/6387   Procedure:  HOLMIUM LASER LITHOTRIPSY;  Surgeon: Cleon Gustin, MD;  Location: [facility];  Service: Urology;  Laterality: Right;  . LEFT HEART CATHETERIZATION WITH  CORONARY/GRAFT ANGIOGRAM N/A 02/23/2013   Procedure: LEFT HEART CATHETERIZATION WITH Beatrix Fetters;  Surgeon: Blane Ohara, MD;  Location: [facility] CATH LAB;  Service: Cardiovascular;  Laterality: N/A;  . PORTACATH PLACEMENT Left 01/18/2016   Procedure: INSERTION PORT-A-CATH;  Surgeon: Alphonsa Overall, MD;  Location: [place];  Service: General;  Laterality: Left;  . TONSILLECTOMY    . TRANSTHORACIC ECHOCARDIOGRAM  02-24-2013      mild LVH,  ef 50-55%/  AV bioprosthesis was present with very mild stenosis and no regurg., mean grandient 67mHg, peak grandient 252mg /  mild MR/  mild LAE and RAE/  moderate TR  . TUBAL LIGATION      has HYPERLIPIDEMIA; HYPERTENSION; Atrial fibrillation ([place] Allergic rhinitis; Asthma; DYSPNEA; COUGH; Hypertension; Hyperlipidemia; SOB (shortness of breath); CHF (congestive heart failure) (HCGlens[place] Renal insufficiency, mild; Dyspnea; Nonischemic cardiomyopathy (HCMaple [place] Anemia; Diabetes mellitus, type 2 (HCE[place] Iron deficiency anemia; S/P aortic valve replacement; CAD (coronary artery disease); Non-ST elevation myocardial infarction (NSTEMI), initial episode of [facility] ([facility] Atrial tachycardia (HCMel[place] Unstable angina (HCKetch[place] Chest pain; Acute on chronic combined systolic and diastolic CHF, NYHA class 3 (HCH[place] CKD (chronic kidney disease); Chronic diastolic CHF (congestive heart failure) (HCDumas Acute bronchitis; Breast cancer of upper-inner quadrant of right female breast (HCDal[place] catheter in place; Chemotherapy induced nausea and vomiting; GERD (gastroesophageal reflux disease); [place] (HCValley [place] and Antineoplastic chemotherapy induced anemia on her problem list.    is allergic to amoxicillin; imdur [isosorbide dinitrate]; latex; and tetracycline.    Medication List       Accurate as of 03/30/16  7:05 PM. Always use your most recent med list.          ACCU-CHEK AVIVA PLUS test strip Generic drug:  glucose blood   alendronate 70 MG tablet Commonly known as:  FOSAMAX Take  70 mg by mouth once a week. Take with a full glass of water on an empty stomach.   aspirin 81 MG EC tablet Take 1 tablet (81 mg total) by mouth daily.   budesonide-formoterol 160-4.5 MCG/ACT inhaler Commonly known as:  SYMBICORT INHALE TWO PUFFS INTO LUNGS TWICE DAILY   bumetanide 1 MG tablet Commonly known as:  BUMEX Take 1 tablet (1 mg total) by mouth daily.   clopidogrel 75 MG tablet Commonly known as:  PLAVIX Take 75 mg by mouth daily.   gabapentin 100 MG capsule Commonly known as:  NEURONTIN Take 1 capsule (100 mg total) by mouth 3 (three) times daily. Take 1 capsule (1003mby mouth 3 times daily for 1 week. Then take 2 capsule (200m7my mouth 3 times daily for 1 week.   gabapentin 300 MG capsule Commonly known as:  NEURONTIN Take 1 capsule (300 mg total) by mouth 3 (three) times daily.   HUMALOG 100 UNIT/ML injection Generic drug:  insulin lispro Inject 76 units daily with  V-GO pump   lidocaine-prilocaine cream Commonly known as:  EMLA Apply to affected area once   loratadine 10 MG tablet Commonly known as:  CLARITIN Take 10 mg by mouth daily.   LORazepam 2 MG tablet Commonly known as:  ATIVAN Take 0.5 tablets (1 mg total) by mouth at bedtime.   metoprolol succinate 100 MG 24 hr tablet Commonly known as:  TOPROL-XL TAKE ONE TABLET BY MOUTH TWICE DAILY TAKE  WITH  OR  IMMEDIATELY  FOLLOWING  A  MEAL   omeprazole 20 MG tablet Commonly known as:  PRILOSEC OTC Take 20 mg by mouth daily.   ondansetron 8 MG tablet Commonly known as:  ZOFRAN TAKE 1 TABLET BY MOUTH 2  TIMES DAILY AS NEEDED FOR  REFRACTORY NAUSEA /  VOMITING. START ON DAY 3  AFTER CHEMO.   prochlorperazine 10 MG tablet Commonly known as:  COMPAZINE Take 1 tablet (10 mg total) by mouth every 6 (six) hours as needed (Nausea or vomiting).   rosuvastatin 40 MG tablet Commonly known as:  CRESTOR TAKE ONE TABLET BY MOUTH ONCE DAILY   sucralfate 1 GM/10ML suspension Commonly known as:   CARAFATE Take 10 mLs (1 g total) by mouth 4 (four) times daily -  with meals and at bedtime.   TraMADol HCl 200 MG Cp24 Take 1 tablet by mouth daily as needed (for pain).   TRULICITY 3.73 SK/8.7GO Sopn Generic drug:  Dulaglutide Inject 0.75 mg into the skin every Wednesday.   ZETIA 10 MG tablet Generic drug:  ezetimibe Take 1 tablet by mouth  daily        PHYSICAL EXAMINATION  Oncology Vitals 03/30/2016 03/21/2016  Height - 165 cm  Weight 97.796 kg 89.812 kg  Weight (lbs) 215 lbs 10 oz 198 lbs  BMI (kg/m2) 35.88 kg/m2 32.95 kg/m2  Temp 98.1 -  Pulse 77 84  Resp 19 -  SpO2 100 99  BSA (m2) 2.12 m2 2.03 m2   BP Readings from Last 2 Encounters:  03/30/16 (!) 150/50  03/21/16 (!) 110/54    Physical Exam  Constitutional: She is oriented to person, place, and time and well-developed, well-nourished, and in no distress.  HENT:  Head: Normocephalic and atraumatic.  Mouth/Throat: Oropharynx is clear and moist.  Eyes: Conjunctivae and EOM are normal. Pupils are equal, round, and reactive to light. Right eye exhibits no discharge. Left eye exhibits no discharge. No scleral icterus.  Neck: Normal range of motion. Neck supple. No JVD present. No tracheal deviation present. No thyromegaly present.  Cardiovascular: Normal rate, regular rhythm, normal heart sounds and intact distal pulses.   Pulmonary/Chest: Effort normal and breath sounds normal. No respiratory distress. She has no wheezes. She has no rales. She exhibits no tenderness.  Abdominal: Soft. Bowel sounds are normal. She exhibits no distension and no mass. There is no tenderness. There is no rebound and no guarding.  Musculoskeletal: Normal range of motion. She exhibits no edema or tenderness.  Lymphadenopathy:    She has no cervical adenopathy.  Neurological: She is alert and oriented to person, place, and time. Gait normal.  Skin: Skin is warm and dry. No rash noted. No erythema. There is pallor.  Psychiatric: Affect  normal.  Nursing note and vitals reviewed.   LABORATORY DATA:. Appointment on 03/30/2016  Component Date Value Ref Range  Status  . WBC 03/30/2016 6.3  3.9 - 10.3 10e3/uL Final  . NEUT# 03/30/2016 4.7  1.5 - 6.5 10e3/uL Final  . HGB 03/30/2016 7.3* 11.6 - 15.9 g/dL Final  . HCT 03/30/2016 23.2* 34.8 - 46.6 % Final  . Platelets 03/30/2016 152  145 - 400 10e3/uL Final  . MCV 03/30/2016 91.5  79.5 - 101.0 fL Final  . MCH 03/30/2016 28.9  25.1 - 34.0 pg Final  . MCHC 03/30/2016 31.6  31.5 - 36.0 g/dL Final  . RBC 03/30/2016 2.54* 3.70 - 5.45 10e6/uL Final  . RDW 03/30/2016 16.0* 11.2 - 14.5 % Final  . lymph# 03/30/2016 0.6* 0.9 - 3.3 10e3/uL Final  . MONO# 03/30/2016 0.9  0.1 - 0.9 10e3/uL Final  . Eosinophils Absolute 03/30/2016 0.0  0.0 - 0.5 10e3/uL Final  . Basophils Absolute 03/30/2016 0.1  0.0 - 0.1 10e3/uL Final  . NEUT% 03/30/2016 74.1  38.4 - 76.8 % Final  . LYMPH% 03/30/2016 10.2* 14.0 - 49.7 % Final  . MONO% 03/30/2016 13.7  0.0 - 14.0 % Final  . EOS% 03/30/2016 0.6  0.0 - 7.0 % Final  . BASO% 03/30/2016 1.4  0.0 - 2.0 % Final  . Sodium 03/30/2016 141  136 - 145 mEq/L Final  . Potassium 03/30/2016 4.2  3.5 - 5.1 mEq/L Final  . Chloride 03/30/2016 106  98 - 109 mEq/L Final  . CO2 03/30/2016 27  22 - 29 mEq/L Final  . Glucose 03/30/2016 189* 70 - 140 mg/dl Final  . BUN 03/30/2016 22.7  7.0 - 26.0 mg/dL Final  . Creatinine 03/30/2016 1.1  0.6 - 1.1 mg/dL Final  . Total Bilirubin 03/30/2016 0.66  0.20 - 1.20 mg/dL Final  . Alkaline Phosphatase 03/30/2016 54  40 - 150 U/L Final  . AST 03/30/2016 39* 5 - 34 U/L Final  . ALT 03/30/2016 28  0 - 55 U/L Final  . Total Protein 03/30/2016 5.3* 6.4 - 8.3 g/dL Final  . Albumin 03/30/2016 2.4* 3.5 - 5.0 g/dL Final  . Calcium 03/30/2016 8.5  8.4 - 10.4 mg/dL Final  . Anion Gap 03/30/2016 7  3 - 11 mEq/L Final  . EGFR 03/30/2016 50* >90 ml/min/1.73 m2 Final    RADIOGRAPHIC STUDIES: No results found.  ASSESSMENT/PLAN:    Breast  cancer of upper-inner quadrant of right female breast [facility]) Patient presented to the [facility] today to receive cycle 3 of her TC chemotherapy regimen.  She is scheduled to return on 04/20/2016 for labs, flush, and her next cycle of chemotherapy.  Of note-there is no follow-up visit scheduled; but will review all with Dr. Lindi Adie, and determine when patient should be seen in follow-up again.  Antineoplastic chemotherapy induced anemia Patient received cycle 3 of her TC chemotherapy regimen today.  Labs obtained earlier today reveal hemoglobin dropped from 8.9 down to 7.3; but patient was asymptomatic and had denied all fatigue and shortness of breath.  However, once patient had completed all of her chemotherapy today-patient complained of significant short no soap breath and fatigue when she made a quick trip to the bathroom.  She denied any actual chest pain; but did feel there was some tightness in her chest for just a few seconds.  All vital signs remained stable throughout; and patient was afebrile.  Patient was noted have clear breath sounds bilaterally; and had a normal heart rhythm.  Patient was in no acute distress whatsoever.  Reviewed all findings with Dr. Irene Limbo; and he recommended that patient  proceed with a blood transfusion first thing tomorrow morning for treatment of symptomatic anemia.  Also, patient was advised to go directly to the emergency department over the weekend for any worsening symptoms whatsoever.   Patient stated understanding of all instructions; and was in agreement with this plan of care. The patient knows to call the clinic with any problems, questions or concerns.   Total time spent with patient was 25 minutes;  with greater than 75 percent of that time spent in face to face counseling regarding patient's symptoms,  and coordination of care and follow up.  Disclaimer:This dictation was prepared with Dragon/digital dictation along with [employer]. Any transcriptional errors that result from this process are unintentional.  Drue Second, NP 03/30/2016

## 2016-03-30 NOTE — Assessment & Plan Note (Signed)
Patient presented to the [facility] today to receive cycle 3 of her TC chemotherapy regimen.  She is scheduled to return on 04/20/2016 for labs, flush, and her next cycle of chemotherapy.  Of note-there is no follow-up visit scheduled; but will review all with Dr. Lindi Adie, and determine when patient should be seen in follow-up again.

## 2016-03-30 NOTE — Progress Notes (Signed)
Patient Care Team: Nicola Girt, DO as PCP - General Alphonsa Overall, MD as Consulting Physician (General Surgery) Nicholas Lose, MD as Consulting Physician (Hematology and Oncology) Gery Pray, MD as Consulting Physician (Radiation Oncology)  DIAGNOSIS:  Encounter Diagnosis  Name Primary?  . Malignant neoplasm of upper-inner quadrant of right breast in female, estrogen receptor positive ([place])     SUMMARY OF ONCOLOGIC HISTORY:   Breast cancer of upper-inner quadrant of right female breast ([place])   12/14/2015 Initial Diagnosis    Right breast biopsy 12:30 position: 2 masses, 1.6 cm mass: Invasive ductal carcinoma, grade 2, ER 0%, PR 0%, HER-2 negative, Ki-67 70%; satellite mass 8 mm: IDC grade 2 ER 5%, PR 5%, HER-2 negative, Ki-67 50%; T1cN0 stage IA clinical stage      01/18/2016 Surgery    Right lumpectomy: Multifocal IDC grade 3, 1.9 cm  ER 0%, PR 0%, HER-2 negative, Ki-67 70% and 0.8 cm satellite mass ER 5%, PR 2%, HER-2 negative, Ki-67 50%, high-grade DCIS, margins negative, 0/1 lymph nodes negative T1 cN0 stage IA      02/17/2016 -  Anti-estrogen oral therapy    Adjuvant chemotherapy with Taxotere and Cytoxan4       CHIEF COMPLIANT: Cycle 3 Taxotere and Cytoxan  INTERVAL HISTORY: Hannah [name] is a 70 year old with above-mentioned history of right breast cancer currently on adjuvant chemotherapy and today's cycle 3 of Taxotere and Cytoxan. She tells me that she tolerated cycle 2 much better with the reduced dosage of chemotherapy. She is more anemic today than before. However she does not report any new symptoms like worsening shortness of breath. She does have fatigue. Her taste and appetite are poor.  REVIEW OF SYSTEMS:   Constitutional: Denies fevers, chills or abnormal weight loss Eyes: Denies blurriness of vision Ears, nose, mouth, throat, and face: Denies mucositis or sore throat Respiratory: Denies cough, dyspnea or wheezes Cardiovascular: Denies palpitation, chest  discomfort Gastrointestinal:  Denies nausea, heartburn or change in bowel habits Skin: Denies abnormal skin rashes Lymphatics: Denies new lymphadenopathy or easy bruising Neurological:Denies numbness, tingling or new weaknesses Behavioral/Psych: Mood is stable, no new changes  Extremities: No lower extremity edema  All other systems were reviewed with the patient and are negative.  I have reviewed the past medical history, past surgical history, social history and family history with the patient and they are unchanged from previous note.  ALLERGIES:  is allergic to amoxicillin; imdur [isosorbide dinitrate]; latex; and tetracycline.  MEDICATIONS:  Current Outpatient Prescriptions  Medication Sig Dispense Refill  . ACCU-CHEK AVIVA PLUS test strip     . alendronate (FOSAMAX) 70 MG tablet Take 70 mg by mouth once a week. Take with a full glass of water on an empty stomach.    Marland Kitchen aspirin EC 81 MG EC tablet Take 1 tablet (81 mg total) by mouth daily.    . budesonide-formoterol (SYMBICORT) 160-4.5 MCG/ACT inhaler INHALE TWO PUFFS INTO LUNGS TWICE DAILY 1 Inhaler 11  . clopidogrel (PLAVIX) 75 MG tablet Take 75 mg by mouth daily.    . furosemide (LASIX) 40 MG tablet Take 60 mg by mouth daily.    Marland Kitchen gabapentin (NEURONTIN) 100 MG capsule Take 1 capsule (100 mg total) by mouth 3 (three) times daily. Take 1 capsule (117m) by mouth 3 times daily for 1 week. Then take 2 capsule (2085m by mouth 3 times daily for 1 week. 70 capsule 0  . gabapentin (NEURONTIN) 300 MG capsule Take 1 capsule (300 mg total)  by mouth 3 (three) times daily. 90 capsule 3  . HUMALOG 100 UNIT/ML injection Inject 76 units daily with  V-GO pump 70 mL 2  . lidocaine-prilocaine (EMLA) cream Apply to affected area once 30 g 3  . loratadine (CLARITIN) 10 MG tablet Take 10 mg by mouth daily.    Marland Kitchen LORazepam (ATIVAN) 2 MG tablet Take 2 mg by mouth at bedtime.    . metoprolol succinate (TOPROL-XL) 100 MG 24 hr tablet TAKE ONE TABLET BY  MOUTH TWICE DAILY TAKE  WITH  OR  IMMEDIATELY  FOLLOWING  A  MEAL 180 tablet 2  . omeprazole (PRILOSEC OTC) 20 MG tablet Take 20 mg by mouth daily.    . ondansetron (ZOFRAN) 8 MG tablet TAKE 1 TABLET BY MOUTH 2  TIMES DAILY AS NEEDED FOR  REFRACTORY NAUSEA /  VOMITING. START ON DAY 3  AFTER CHEMO. 60 tablet 0  . prochlorperazine (COMPAZINE) 10 MG tablet Take 1 tablet (10 mg total) by mouth every 6 (six) hours as needed (Nausea or vomiting). 60 tablet 1  . rosuvastatin (CRESTOR) 40 MG tablet TAKE ONE TABLET BY MOUTH ONCE DAILY 30 tablet 11  . sucralfate (CARAFATE) 1 GM/10ML suspension Take 10 mLs (1 g total) by mouth 4 (four) times daily -  with meals and at bedtime. 420 mL 1  . TraMADol HCl 200 MG CP24 Take 1 tablet by mouth daily as needed (for pain).     . TRULICITY 0.31 RX/4.5OP SOPN Inject 0.75 mg into the skin every Wednesday.     Marland Kitchen ZETIA 10 MG tablet Take 1 tablet by mouth  daily 90 tablet 2   No current facility-administered medications for this visit.     PHYSICAL EXAMINATION: ECOG PERFORMANCE STATUS: 1 - Symptomatic but completely ambulatory  Vitals:   03/30/16 1043  BP: (!) 150/50  Pulse: 77  Resp: 19  Temp: 98.1 F (36.7 C)   Filed Weights   03/30/16 1043  Weight: 215 lb 9.6 oz (97.8 kg)    GENERAL:alert, no distress and comfortable SKIN: skin color, texture, turgor are normal, no rashes or significant lesions EYES: normal, Conjunctiva are pink and non-injected, sclera clear OROPHARYNX:no exudate, no erythema and lips, buccal mucosa, and tongue normal  NECK: supple, thyroid normal size, non-tender, without nodularity LYMPH:  no palpable lymphadenopathy in the cervical, axillary or inguinal LUNGS: clear to auscultation and percussion with normal breathing effort HEART: regular rate & rhythm and no murmurs and no lower extremity edema ABDOMEN:abdomen soft, non-tender and normal bowel sounds MUSCULOSKELETAL:no cyanosis of digits and no clubbing  NEURO: alert & oriented  x 3 with fluent speech, no focal motor/sensory deficits EXTREMITIES: No lower extremity edema  LABORATORY DATA:  I have reviewed the data as listed   Chemistry      Component Value Date/Time   NA 141 03/30/2016 0946   K 4.2 03/30/2016 0946   CL 107 03/05/2016 1454   CO2 27 03/30/2016 0946   BUN 22.7 03/30/2016 0946   CREATININE 1.1 03/30/2016 0946      Component Value Date/Time   CALCIUM 8.5 03/30/2016 0946   ALKPHOS 54 03/30/2016 0946   AST 39 (H) 03/30/2016 0946   ALT 28 03/30/2016 0946   BILITOT 0.66 03/30/2016 0946       Lab Results  Component Value Date   WBC 6.3 03/30/2016   HGB 7.3 (L) 03/30/2016   HCT 23.2 (L) 03/30/2016   MCV 91.5 03/30/2016   PLT 152 03/30/2016   NEUTROABS 4.7  03/30/2016     ASSESSMENT & PLAN:  Breast cancer of upper-inner quadrant of right female breast (HCC) Right breast biopsy 12:30 position 12/14/2015: 2 masses, 1.6 cm mass: Invasive ductal carcinoma, grade 2, ER 0%, PR 0%, HER-2 negative, Ki-67 70%; satellite mass 8 mm: IDC grade 2 ER 5%, PR 5%, HER-2 negative, Ki-67 50%; T1cN0 stage IA clinical stage  Right lumpectomy 01/18/2016: Multifocal IDC grade 3, 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 Adjuvant chemotherapy with Taxotere and Cytoxan (4 cycles). Adriamycin is not being given because of her cardiac problems 2. Followed by adjuvant radiation 3 followed by adjuvant antiestrogen therapy (because a satellite tumor is weakly ER and PR positive). --------------------------------------------------------------------------------------------------------------------------------------------------------- Current treatment: Cycle 3 day 1Taxotere and Cytoxan  Chemotherapy  toxicities: 1. Nausea and vomiting: We decreased the dosage of cycle 2 of chemotherapy 2. Dehydration due to diarrhea: Previously recorded IV hydration after cycle 1 chemotherapy. she continues to have intermittent diarrhea. 3.Severe fatigue: Marked improvement 4. Hypoalbuminemia: Patient is working very hard to eat more protein in the diet. 5. Manic episodes: Patient was sent to the emergency room last week and was prescribed Vistaril and trazodone. She does not want to take them anymore because her main problem is insomnia. I instructed her to double the Ativan. Patient is not on any dexamethasone.  6. Severe anemia: Due to chemotherapy will be monitored. Patient may need blood transfusion if she becomes symptomatic or hemoglobin drops below 7.  Reviewed labs  Monitoring closely for toxicities  Return to clinic in 3 weeks for cycle 4   No orders of the defined types were placed in this encounter.  The patient has a good understanding of the overall plan. she agrees with it. she will call with any problems that may develop before the next visit here.   Rulon Eisenmenger, MD 03/30/16

## 2016-03-30 NOTE — Progress Notes (Signed)
Per Dr. Lindi Adie, okay to treat pt today despite hgb 7.3.

## 2016-03-30 NOTE — Patient Instructions (Signed)
[facility] Discharge Instructions for Patients Receiving Chemotherapy  Today you received the following chemotherapy agents Taxotere/Cytoxan To help prevent nausea and vomiting after your treatment, we encourage you to take your nausea medication as prescribed.   If you develop nausea and vomiting that is not controlled by your nausea medication, call the clinic.   BELOW ARE SYMPTOMS THAT SHOULD BE REPORTED IMMEDIATELY:  *FEVER GREATER THAN 100.5 F  *CHILLS WITH OR WITHOUT FEVER  NAUSEA AND VOMITING THAT IS NOT CONTROLLED WITH YOUR NAUSEA MEDICATION  *UNUSUAL SHORTNESS OF BREATH  *UNUSUAL BRUISING OR BLEEDING  TENDERNESS IN MOUTH AND THROAT WITH OR WITHOUT PRESENCE OF ULCERS  *URINARY PROBLEMS  *BOWEL PROBLEMS  UNUSUAL RASH Items with * indicate a potential emergency and should be followed up as soon as possible.  Feel free to call the clinic you have any questions or concerns. The clinic phone number is [phone].  Please show the CHEMO ALERT CARD at check-in to the Emergency Department and triage nurse.   

## 2016-03-30 NOTE — Progress Notes (Signed)
At the end of Cytoxan, patient walked to restroom.  Upon returning to chair, patient became short of breath which she said was significantly worse than usual.  No obvious distress noted.  Selena Lesser, NP notified.  She came to treatment area to evaluate patient.  Patient's symptoms quickly resolved.  Plan to transfuse 2 units PRBC's tomorrow per [place].  T/S drawn from [facility] and sent to lab.  Informed patient to keep blood bracelet on until after transfusion tomorrow.  Voiced understanding.  Patient stated she was back at baseline prior to discharge.  Informed patient and family member to go to nearest ED if symptoms return/worsen. Voiced understanding.

## 2016-03-30 NOTE — Assessment & Plan Note (Signed)
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 Adjuvant chemotherapy with Taxotere and Cytoxan (4 cycles). Adriamycin is not being given because of her cardiac problems 2. Followed by adjuvant radiation 3 followed by adjuvant antiestrogen therapy (because a satellite tumor is weakly ER and PR positive). --------------------------------------------------------------------------------------------------------------------------------------------------------- Current treatment: Cycle 3 day 1Taxotere and Cytoxan  Chemotherapy toxicities: 1. Nausea and vomiting: We decreased the dosage of cycle 2 of chemotherapy 2. Dehydration due to diarrhea: We will give her 1 L of normal saline. We will also add Aloxi. 3.Severe fatigue: Marked improvement 4. Hypoalbuminemia: Patient is working very hard to eat more protein in the diet. 5. Manic episodes: Patient was sent to the emergency room last week and was prescribed Vistaril and trazodone. She does not want to take them anymore because her main problem is insomnia. I instructed her to double the Ativan. Patient is not on any dexamethasone.  6. Severe anemia: Due to chemotherapy will be monitored.  Reviewed labs  Monitoring closely for toxicities  Return to clinic in 3 weeks for cycle 4

## 2016-03-31 ENCOUNTER — Ambulatory Visit (HOSPITAL_BASED_OUTPATIENT_CLINIC_OR_DEPARTMENT_OTHER): Payer: Medicare Other

## 2016-03-31 DIAGNOSIS — T451X5A Adverse effect of antineoplastic and immunosuppressive drugs, initial encounter: Secondary | ICD-10-CM

## 2016-03-31 DIAGNOSIS — D6481 Anemia due to antineoplastic chemotherapy: Secondary | ICD-10-CM | POA: Diagnosis not present

## 2016-03-31 MED ORDER — DIPHENHYDRAMINE HCL 25 MG PO CAPS
25.0000 mg | ORAL_CAPSULE | Freq: Once | ORAL | Status: AC
  Administered 2016-03-31: 25 mg via ORAL

## 2016-03-31 MED ORDER — DIPHENHYDRAMINE HCL 25 MG PO CAPS
ORAL_CAPSULE | ORAL | Status: AC
  Filled 2016-03-31: qty 1

## 2016-03-31 MED ORDER — ACETAMINOPHEN 325 MG PO TABS
650.0000 mg | ORAL_TABLET | Freq: Once | ORAL | Status: AC
  Administered 2016-03-31: 650 mg via ORAL

## 2016-03-31 MED ORDER — HEPARIN SOD (PORK) LOCK FLUSH 100 UNIT/ML IV SOLN
250.0000 [IU] | INTRAVENOUS | Status: DC | PRN
  Filled 2016-03-31: qty 5

## 2016-03-31 MED ORDER — ACETAMINOPHEN 325 MG PO TABS
ORAL_TABLET | ORAL | Status: AC
  Filled 2016-03-31: qty 2

## 2016-03-31 MED ORDER — HEPARIN SOD (PORK) LOCK FLUSH 100 UNIT/ML IV SOLN
500.0000 [IU] | Freq: Every day | INTRAVENOUS | Status: AC | PRN
  Administered 2016-03-31: 500 [IU]
  Filled 2016-03-31: qty 5

## 2016-03-31 MED ORDER — SODIUM CHLORIDE 0.9 % IV SOLN
250.0000 mL | Freq: Once | INTRAVENOUS | Status: AC
  Administered 2016-03-31: 250 mL via INTRAVENOUS

## 2016-03-31 NOTE — Patient Instructions (Signed)

## 2016-04-02 ENCOUNTER — Telehealth: Payer: Self-pay | Admitting: Nurse Practitioner

## 2016-04-02 ENCOUNTER — Encounter: Payer: Self-pay | Admitting: Hematology and Oncology

## 2016-04-02 LAB — TYPE AND SCREEN
ABO/RH(D): O POS
Antibody Screen: NEGATIVE
Unit division: 0
Unit division: 0

## 2016-04-02 NOTE — Telephone Encounter (Signed)
Called patient to check in with her.  She received 2 units packed red blood cell transfusion on Saturday, 03/31/2016.  She states that she is feeling much better.  Her fatigue has greatly improved; and she no longer has any shortness of breath or any chest discomfort whatsoever.  Patient was advised to call/return for any new worries or concerns whatsoever.

## 2016-04-03 ENCOUNTER — Other Ambulatory Visit: Payer: Self-pay | Admitting: *Deleted

## 2016-04-03 ENCOUNTER — Telehealth: Payer: Self-pay | Admitting: *Deleted

## 2016-04-03 ENCOUNTER — Other Ambulatory Visit: Payer: Medicare Other

## 2016-04-03 DIAGNOSIS — C50211 Malignant neoplasm of upper-inner quadrant of right female breast: Secondary | ICD-10-CM

## 2016-04-03 NOTE — Telephone Encounter (Signed)
Tc from patient stating that she has developed a redness around her left ankle that is 4-5" high and goes around the circumference of her ankle. It is hot to the touch, tender and with some edema. Pt states she usually has some swelling in her legs as baseline.  She also states she took her temp and it was 99.6.  She states she has a history of cellulitis in her legs.. Currently receiving chemo of taxotere and cytoxan.  Last treatment was on 03/30/16. Pt also received blood transfusion on 03/31/16  Will schedule for [facility] on 04/04/16 in the am with labs prior to that appt. Urgent LOS sent along with lab orders.

## 2016-04-04 ENCOUNTER — Ambulatory Visit (HOSPITAL_BASED_OUTPATIENT_CLINIC_OR_DEPARTMENT_OTHER): Payer: Medicare Other | Admitting: Nurse Practitioner

## 2016-04-04 ENCOUNTER — Other Ambulatory Visit (HOSPITAL_BASED_OUTPATIENT_CLINIC_OR_DEPARTMENT_OTHER): Payer: Medicare Other

## 2016-04-04 VITALS — BP 147/65 | HR 92 | Temp 98.3°F | Resp 17 | Ht 65.0 in | Wt 209.5 lb

## 2016-04-04 DIAGNOSIS — C50211 Malignant neoplasm of upper-inner quadrant of right female breast: Secondary | ICD-10-CM

## 2016-04-04 DIAGNOSIS — N289 Disorder of kidney and ureter, unspecified: Secondary | ICD-10-CM | POA: Diagnosis not present

## 2016-04-04 DIAGNOSIS — T451X5A Adverse effect of antineoplastic and immunosuppressive drugs, initial encounter: Secondary | ICD-10-CM

## 2016-04-04 DIAGNOSIS — L03116 Cellulitis of left lower limb: Secondary | ICD-10-CM

## 2016-04-04 DIAGNOSIS — D6959 Other secondary thrombocytopenia: Secondary | ICD-10-CM | POA: Diagnosis not present

## 2016-04-04 LAB — CBC WITH DIFFERENTIAL/PLATELET
BASO%: 0.8 % (ref 0.0–2.0)
Basophils Absolute: 0.1 10*3/uL (ref 0.0–0.1)
EOS%: 2.9 % (ref 0.0–7.0)
Eosinophils Absolute: 0.4 10*3/uL (ref 0.0–0.5)
HCT: 30.2 % — ABNORMAL LOW (ref 34.8–46.6)
HGB: 9.6 g/dL — ABNORMAL LOW (ref 11.6–15.9)
LYMPH%: 2.7 % — ABNORMAL LOW (ref 14.0–49.7)
MCH: 29.5 pg (ref 25.1–34.0)
MCHC: 31.7 g/dL (ref 31.5–36.0)
MCV: 93.2 fL (ref 79.5–101.0)
MONO#: 0.2 10*3/uL (ref 0.1–0.9)
MONO%: 1.8 % (ref 0.0–14.0)
NEUT#: 12.5 10*3/uL — ABNORMAL HIGH (ref 1.5–6.5)
NEUT%: 91.8 % — ABNORMAL HIGH (ref 38.4–76.8)
Platelets: 63 10*3/uL — ABNORMAL LOW (ref 145–400)
RBC: 3.24 10*6/uL — ABNORMAL LOW (ref 3.70–5.45)
RDW: 15.7 % — ABNORMAL HIGH (ref 11.2–14.5)
WBC: 13.6 10*3/uL — ABNORMAL HIGH (ref 3.9–10.3)
lymph#: 0.4 10*3/uL — ABNORMAL LOW (ref 0.9–3.3)

## 2016-04-04 LAB — COMPREHENSIVE METABOLIC PANEL
ALT: 17 U/L (ref 0–55)
AST: 25 U/L (ref 5–34)
Albumin: 2.4 g/dL — ABNORMAL LOW (ref 3.5–5.0)
Alkaline Phosphatase: 88 U/L (ref 40–150)
Anion Gap: 8 mEq/L (ref 3–11)
BUN: 32.1 mg/dL — ABNORMAL HIGH (ref 7.0–26.0)
CO2: 30 mEq/L — ABNORMAL HIGH (ref 22–29)
Calcium: 9 mg/dL (ref 8.4–10.4)
Chloride: 103 mEq/L (ref 98–109)
Creatinine: 1.4 mg/dL — ABNORMAL HIGH (ref 0.6–1.1)
EGFR: 37 mL/min/{1.73_m2} — ABNORMAL LOW (ref >90)
Glucose: 138 mg/dl (ref 70–140)
Potassium: 4.5 mEq/L (ref 3.5–5.1)
Sodium: 140 mEq/L (ref 136–145)
Total Bilirubin: 0.94 mg/dL (ref 0.20–1.20)
Total Protein: 5.8 g/dL — ABNORMAL LOW (ref 6.4–8.3)

## 2016-04-04 MED ORDER — SODIUM CHLORIDE 0.9 % IV SOLN
Freq: Once | INTRAVENOUS | Status: AC
  Administered 2016-04-04: 10:00:00 via INTRAVENOUS

## 2016-04-04 MED ORDER — VANCOMYCIN HCL 10 G IV SOLR
1250.0000 mg | Freq: Once | INTRAVENOUS | Status: AC
  Administered 2016-04-04: 1250 mg via INTRAVENOUS
  Filled 2016-04-04: qty 1250

## 2016-04-04 MED ORDER — SODIUM CHLORIDE 0.9% FLUSH
10.0000 mL | Freq: Once | INTRAVENOUS | Status: AC
  Administered 2016-04-04: 10 mL via INTRAVENOUS
  Filled 2016-04-04: qty 10

## 2016-04-04 MED ORDER — HEPARIN SOD (PORK) LOCK FLUSH 100 UNIT/ML IV SOLN
500.0000 [IU] | Freq: Once | INTRAVENOUS | Status: AC
  Administered 2016-04-04: 500 [IU] via INTRAVENOUS
  Filled 2016-04-04: qty 5

## 2016-04-04 NOTE — Patient Instructions (Signed)
Vancomycin injection What is this medicine? VANCOMYCIN (van koe MYE sin) is a glycopeptide antibiotic. It is used to treat certain kinds of bacterial infections. It will not work for colds, flu, or other viral infections. This medicine may be used for other purposes; ask your health care provider or pharmacist if you have questions. What should I tell my health care provider before I take this medicine? They need to know if you have any of these conditions: -dehydration -hearing loss -kidney disease -other chronic illness -an unusual or allergic reaction to vancomycin, other medicines, foods, dyes, or preservatives -pregnant or trying to get pregnant -breast-feeding How should I use this medicine? This medicine is infused into a vein. It is usually given by a health care provider in a hospital or clinic. If you receive this medicine at home, you will receive special instructions. Take your medicine at regular intervals. Do not take your medicine more often than directed. Take all of your medicine as directed even if you think you are better. Do not skip doses or stop your medicine early. It is important that you put your used needles and syringes in a special sharps container. Do not put them in a trash can. If you do not have a sharps container, call your pharmacist or healthcare provider to get one. Talk to your pediatrician regarding the use of this medicine in children. While this drug may be prescribed for even very young infants for selected conditions, precautions do apply. Overdosage: If you think you have taken too much of this medicine contact a poison control center or emergency room at once. NOTE: This medicine is only for you. Do not share this medicine with others. What if I miss a dose? If you miss a dose, take it as soon as you can. If it is almost time for your next dose, take only that dose. Do not take double or extra doses. What may interact with this  medicine? -amphotericin B -anesthetics -bacitracin -birth control pills -cisplatin -colistin -diuretics -other aminoglycoside antibiotics -polymyxin B This list may not describe all possible interactions. Give your health care provider a list of all the medicines, herbs, non-prescription drugs, or dietary supplements you use. Also tell them if you smoke, drink alcohol, or use illegal drugs. Some items may interact with your medicine. What should I watch for while using this medicine? Tell your doctor or health care professional if your symptoms do not improve or if you get new symptoms. Your condition and lab work will be monitored while you are taking this medicine. Do not treat diarrhea with over the counter products. Contact your doctor if you have diarrhea that lasts more than 2 days or if it is severe and watery. What side effects may I notice from receiving this medicine? Side effects that you should report to your doctor or health care professional as soon as possible: -allergic reactions like skin rash, itching or hives, swelling of the face, lips, or tongue -breathing difficulty, wheezing -change in amount, color of urine -change in hearing -chest pain -dizziness -fever, chills -flushing of the face and neck (reddening) -low blood pressure -redness, blistering, peeling or loosening of the skin, including inside the mouth -unusual bleeding or bruising -unusually weak or tired Side effects that usually do not require medical attention (report to your doctor or health care professional if they continue or are bothersome): -nausea, vomiting -pain, swelling where injected -stomach cramps This list may not describe all possible side effects. Call your doctor for   medical advice about side effects. You may report side effects to FDA at 1-800-FDA-1088. Where should I keep my medicine? Keep out of the reach of children. You will be instructed on how to store this medicine, if  needed. Throw away any unused medicine after the expiration date on the label. NOTE: This sheet is a summary. It may not cover all possible information. If you have questions about this medicine, talk to your doctor, pharmacist, or health care provider.    2016, Elsevier/Gold Standard. (2013-01-02 14:46:02)    Cellulitis Cellulitis is an infection of the skin and the tissue beneath it. The infected area is usually red and tender. Cellulitis occurs most often in the arms and lower legs.  CAUSES  Cellulitis is caused by bacteria that enter the skin through cracks or cuts in the skin. The most common types of bacteria that cause cellulitis are staphylococci and streptococci. SIGNS AND SYMPTOMS   Redness and warmth.  Swelling.  Tenderness or pain.  Fever. DIAGNOSIS  Your health care provider can usually determine what is wrong based on a physical exam. Blood tests may also be done. TREATMENT  Treatment usually involves taking an antibiotic medicine. HOME CARE INSTRUCTIONS   Take your antibiotic medicine as directed by your health care provider. Finish the antibiotic even if you start to feel better.  Keep the infected arm or leg elevated to reduce swelling.  Apply a warm cloth to the affected area up to 4 times per day to relieve pain.  Take medicines only as directed by your health care provider.  Keep all follow-up visits as directed by your health care provider. SEEK MEDICAL CARE IF:   You notice red streaks coming from the infected area.  Your red area gets larger or turns dark in color.  Your bone or joint underneath the infected area becomes painful after the skin has healed.  Your infection returns in the same area or another area.  You notice a swollen bump in the infected area.  You develop new symptoms.  You have a fever. SEEK IMMEDIATE MEDICAL CARE IF:   You feel very sleepy.  You develop vomiting or diarrhea.  You have a general ill feeling (malaise)  with muscle aches and pains.   This information is not intended to replace advice given to you by your health care provider. Make sure you discuss any questions you have with your health care provider.   Document Released: 03/07/2005 Document Revised: 02/16/2015 Document Reviewed: 08/13/2011 Elsevier Interactive Patient Education [employer].

## 2016-04-05 ENCOUNTER — Other Ambulatory Visit: Payer: Self-pay | Admitting: Nurse Practitioner

## 2016-04-05 ENCOUNTER — Ambulatory Visit (HOSPITAL_BASED_OUTPATIENT_CLINIC_OR_DEPARTMENT_OTHER): Payer: Medicare Other | Admitting: Nurse Practitioner

## 2016-04-05 VITALS — BP 103/44 | HR 93 | Temp 99.0°F | Resp 18 | Ht 65.0 in | Wt 212.8 lb

## 2016-04-05 DIAGNOSIS — L03116 Cellulitis of left lower limb: Secondary | ICD-10-CM

## 2016-04-05 DIAGNOSIS — Z95828 Presence of other vascular implants and grafts: Secondary | ICD-10-CM

## 2016-04-05 DIAGNOSIS — C50211 Malignant neoplasm of upper-inner quadrant of right female breast: Secondary | ICD-10-CM

## 2016-04-05 MED ORDER — SODIUM CHLORIDE 0.9 % IJ SOLN
10.0000 mL | INTRAMUSCULAR | Status: DC | PRN
  Administered 2016-04-05: 10 mL
  Filled 2016-04-05: qty 10

## 2016-04-05 MED ORDER — VANCOMYCIN HCL 10 G IV SOLR
1250.0000 mg | Freq: Once | INTRAVENOUS | Status: AC
  Administered 2016-04-05: 1250 mg via INTRAVENOUS
  Filled 2016-04-05: qty 1250

## 2016-04-05 MED ORDER — HEPARIN SOD (PORK) LOCK FLUSH 100 UNIT/ML IV SOLN
500.0000 [IU] | Freq: Once | INTRAVENOUS | Status: AC | PRN
  Administered 2016-04-05: 500 [IU]
  Filled 2016-04-05: qty 5

## 2016-04-05 NOTE — Patient Instructions (Signed)
Cellulitis °Cellulitis is an infection of the skin and the tissue under the skin. The infected area is usually red and tender. This happens most often in the arms and lower legs. °HOME CARE  °· Take your antibiotic medicine as told. Finish the medicine even if you start to feel better. °· Keep the infected arm or leg raised (elevated). °· Put a warm cloth on the area up to 4 times per day. °· Only take medicines as told by your doctor. °· Keep all doctor visits as told. °GET HELP IF: °· You see red streaks on the skin coming from the infected area. °· Your red area gets bigger or turns a dark color. °· Your bone or joint under the infected area is painful after the skin heals. °· Your infection comes back in the same area or different area. °· You have a puffy (swollen) bump in the infected area. °· You have new symptoms. °· You have a fever. °GET HELP RIGHT AWAY IF:  °· You feel very sleepy. °· You throw up (vomit) or have watery poop (diarrhea). °· You feel sick and have muscle aches and pains. °  °This information is not intended to replace advice given to you by your health care provider. Make sure you discuss any questions you have with your health care provider. °  °Document Released: 11/14/2007 Document Revised: 02/16/2015 Document Reviewed: 08/13/2011 °Elsevier Interactive Patient Education ©2016 Elsevier Inc. ° °

## 2016-04-06 ENCOUNTER — Other Ambulatory Visit: Payer: Self-pay

## 2016-04-06 ENCOUNTER — Other Ambulatory Visit: Payer: Self-pay | Admitting: Nurse Practitioner

## 2016-04-06 ENCOUNTER — Encounter: Payer: Self-pay | Admitting: Nurse Practitioner

## 2016-04-06 ENCOUNTER — Encounter (HOSPITAL_COMMUNITY): Payer: Self-pay | Admitting: Emergency Medicine

## 2016-04-06 ENCOUNTER — Other Ambulatory Visit (HOSPITAL_COMMUNITY): Payer: Self-pay

## 2016-04-06 ENCOUNTER — Ambulatory Visit (HOSPITAL_BASED_OUTPATIENT_CLINIC_OR_DEPARTMENT_OTHER): Payer: Medicare Other | Admitting: Nurse Practitioner

## 2016-04-06 ENCOUNTER — Inpatient Hospital Stay (HOSPITAL_COMMUNITY)
Admission: EM | Admit: 2016-04-06 | Discharge: 2016-04-10 | DRG: 603 | Disposition: A | Discharge status: Home / Self Care | Payer: Medicare Other | Attending: Internal Medicine | Admitting: Internal Medicine

## 2016-04-06 ENCOUNTER — Emergency Department (HOSPITAL_COMMUNITY): Admit: 2016-04-06 | Discharge: 2016-04-06 | Disposition: A | Discharge status: Home / Self Care | Payer: Medicare Other

## 2016-04-06 ENCOUNTER — Ambulatory Visit: Payer: Medicare Other | Admitting: Endocrinology

## 2016-04-06 VITALS — BP 102/67 | HR 102 | Temp 99.0°F | Resp 18 | Wt 214.0 lb

## 2016-04-06 DIAGNOSIS — J4540 Moderate persistent asthma, uncomplicated: Secondary | ICD-10-CM | POA: Diagnosis present

## 2016-04-06 DIAGNOSIS — T451X5A Adverse effect of antineoplastic and immunosuppressive drugs, initial encounter: Secondary | ICD-10-CM

## 2016-04-06 DIAGNOSIS — D6959 Other secondary thrombocytopenia: Secondary | ICD-10-CM

## 2016-04-06 DIAGNOSIS — R000 Tachycardia, unspecified: Secondary | ICD-10-CM

## 2016-04-06 DIAGNOSIS — D649 Anemia, unspecified: Secondary | ICD-10-CM

## 2016-04-06 DIAGNOSIS — C50211 Malignant neoplasm of upper-inner quadrant of right female breast: Secondary | ICD-10-CM

## 2016-04-06 DIAGNOSIS — L03116 Cellulitis of left lower limb: Secondary | ICD-10-CM

## 2016-04-06 DIAGNOSIS — I6523 Occlusion and stenosis of bilateral carotid arteries: Secondary | ICD-10-CM | POA: Diagnosis present

## 2016-04-06 DIAGNOSIS — E785 Hyperlipidemia, unspecified: Secondary | ICD-10-CM | POA: Diagnosis present

## 2016-04-06 DIAGNOSIS — L03115 Cellulitis of right lower limb: Secondary | ICD-10-CM | POA: Diagnosis present

## 2016-04-06 DIAGNOSIS — I5032 Chronic diastolic (congestive) heart failure: Secondary | ICD-10-CM | POA: Diagnosis present

## 2016-04-06 DIAGNOSIS — E1122 Type 2 diabetes mellitus with diabetic chronic kidney disease: Secondary | ICD-10-CM | POA: Diagnosis present

## 2016-04-06 DIAGNOSIS — Z951 Presence of aortocoronary bypass graft: Secondary | ICD-10-CM

## 2016-04-06 DIAGNOSIS — Z87442 Personal history of urinary calculi: Secondary | ICD-10-CM

## 2016-04-06 DIAGNOSIS — D509 Iron deficiency anemia, unspecified: Secondary | ICD-10-CM | POA: Diagnosis present

## 2016-04-06 DIAGNOSIS — N184 Chronic kidney disease, stage 4 (severe): Secondary | ICD-10-CM | POA: Diagnosis present

## 2016-04-06 DIAGNOSIS — I509 Heart failure, unspecified: Secondary | ICD-10-CM

## 2016-04-06 DIAGNOSIS — N182 Chronic kidney disease, stage 2 (mild): Secondary | ICD-10-CM

## 2016-04-06 DIAGNOSIS — Z7982 Long term (current) use of aspirin: Secondary | ICD-10-CM | POA: Diagnosis not present

## 2016-04-06 DIAGNOSIS — I252 Old myocardial infarction: Secondary | ICD-10-CM | POA: Diagnosis not present

## 2016-04-06 DIAGNOSIS — Z171 Estrogen receptor negative status [ER-]: Principal | ICD-10-CM

## 2016-04-06 DIAGNOSIS — E119 Type 2 diabetes mellitus without complications: Secondary | ICD-10-CM | POA: Diagnosis not present

## 2016-04-06 DIAGNOSIS — K210 Gastro-esophageal reflux disease with esophagitis: Secondary | ICD-10-CM

## 2016-04-06 DIAGNOSIS — I69398 Other sequelae of cerebral infarction: Secondary | ICD-10-CM

## 2016-04-06 DIAGNOSIS — R0789 Other chest pain: Secondary | ICD-10-CM

## 2016-04-06 DIAGNOSIS — H918X1 Other specified hearing loss, right ear: Secondary | ICD-10-CM | POA: Diagnosis present

## 2016-04-06 DIAGNOSIS — I428 Other cardiomyopathies: Secondary | ICD-10-CM

## 2016-04-06 DIAGNOSIS — I130 Hypertensive heart and chronic kidney disease with heart failure and stage 1 through stage 4 chronic kidney disease, or unspecified chronic kidney disease: Secondary | ICD-10-CM | POA: Diagnosis present

## 2016-04-06 DIAGNOSIS — M7989 Other specified soft tissue disorders: Secondary | ICD-10-CM

## 2016-04-06 DIAGNOSIS — I480 Paroxysmal atrial fibrillation: Secondary | ICD-10-CM | POA: Diagnosis not present

## 2016-04-06 DIAGNOSIS — Z8679 Personal history of other diseases of the circulatory system: Secondary | ICD-10-CM | POA: Diagnosis present

## 2016-04-06 DIAGNOSIS — I2510 Atherosclerotic heart disease of native coronary artery without angina pectoris: Secondary | ICD-10-CM

## 2016-04-06 DIAGNOSIS — Z833 Family history of diabetes mellitus: Secondary | ICD-10-CM

## 2016-04-06 DIAGNOSIS — Z953 Presence of xenogenic heart valve: Secondary | ICD-10-CM

## 2016-04-06 DIAGNOSIS — I200 Unstable angina: Secondary | ICD-10-CM | POA: Diagnosis present

## 2016-04-06 DIAGNOSIS — I471 Supraventricular tachycardia: Secondary | ICD-10-CM | POA: Diagnosis present

## 2016-04-06 DIAGNOSIS — D696 Thrombocytopenia, unspecified: Secondary | ICD-10-CM

## 2016-04-06 DIAGNOSIS — R112 Nausea with vomiting, unspecified: Secondary | ICD-10-CM | POA: Diagnosis present

## 2016-04-06 DIAGNOSIS — Z794 Long term (current) use of insulin: Secondary | ICD-10-CM | POA: Diagnosis not present

## 2016-04-06 DIAGNOSIS — K219 Gastro-esophageal reflux disease without esophagitis: Secondary | ICD-10-CM | POA: Diagnosis present

## 2016-04-06 DIAGNOSIS — N183 Chronic kidney disease, stage 3 (moderate): Secondary | ICD-10-CM | POA: Diagnosis present

## 2016-04-06 DIAGNOSIS — I248 Other forms of acute ischemic heart disease: Secondary | ICD-10-CM | POA: Diagnosis present

## 2016-04-06 DIAGNOSIS — M869 Osteomyelitis, unspecified: Secondary | ICD-10-CM

## 2016-04-06 DIAGNOSIS — T458X5A Adverse effect of other primarily systemic and hematological agents, initial encounter: Secondary | ICD-10-CM | POA: Diagnosis present

## 2016-04-06 DIAGNOSIS — Z952 Presence of prosthetic heart valve: Secondary | ICD-10-CM

## 2016-04-06 DIAGNOSIS — E1142 Type 2 diabetes mellitus with diabetic polyneuropathy: Secondary | ICD-10-CM | POA: Diagnosis present

## 2016-04-06 DIAGNOSIS — I482 Chronic atrial fibrillation: Secondary | ICD-10-CM | POA: Diagnosis present

## 2016-04-06 DIAGNOSIS — I10 Essential (primary) hypertension: Secondary | ICD-10-CM | POA: Diagnosis present

## 2016-04-06 DIAGNOSIS — Z8249 Family history of ischemic heart disease and other diseases of the circulatory system: Secondary | ICD-10-CM

## 2016-04-06 DIAGNOSIS — I481 Persistent atrial fibrillation: Secondary | ICD-10-CM | POA: Diagnosis not present

## 2016-04-06 HISTORY — DX: Osteomyelitis, unspecified: M86.9

## 2016-04-06 HISTORY — DX: Other secondary thrombocytopenia: D69.59

## 2016-04-06 LAB — CBC WITH DIFFERENTIAL/PLATELET
Basophils Absolute: 0.1 10*3/uL (ref 0.0–0.1)
Basophils Relative: 1 %
Eosinophils Absolute: 0.2 10*3/uL (ref 0.0–0.7)
Eosinophils Relative: 3 %
HCT: 27 % — ABNORMAL LOW (ref 36.0–46.0)
Hemoglobin: 8.5 g/dL — ABNORMAL LOW (ref 12.0–15.0)
Lymphocytes Relative: 12 %
Lymphs Abs: 0.8 10*3/uL (ref 0.7–4.0)
MCH: 30.2 pg (ref 26.0–34.0)
MCHC: 31.5 g/dL (ref 30.0–36.0)
MCV: 96.1 fL (ref 78.0–100.0)
Monocytes Absolute: 1.7 10*3/uL — ABNORMAL HIGH (ref 0.1–1.0)
Monocytes Relative: 27 %
Neutro Abs: 3.5 10*3/uL (ref 1.7–7.7)
Neutrophils Relative %: 57 %
Platelets: 122 10*3/uL — ABNORMAL LOW (ref 150–400)
RBC: 2.81 MIL/uL — ABNORMAL LOW (ref 3.87–5.11)
RDW: 15.3 % (ref 11.5–15.5)
WBC: 6.3 10*3/uL (ref 4.0–10.5)

## 2016-04-06 LAB — COMPREHENSIVE METABOLIC PANEL
ALT: 25 U/L (ref 14–54)
AST: 41 U/L (ref 15–41)
Albumin: 2.6 g/dL — ABNORMAL LOW (ref 3.5–5.0)
Alkaline Phosphatase: 65 U/L (ref 38–126)
Anion gap: 6 (ref 5–15)
BUN: 29 mg/dL — ABNORMAL HIGH (ref 6–20)
CO2: 29 mmol/L (ref 22–32)
Calcium: 8.5 mg/dL — ABNORMAL LOW (ref 8.9–10.3)
Chloride: 105 mmol/L (ref 101–111)
Creatinine, Ser: 1.54 mg/dL — ABNORMAL HIGH (ref 0.44–1.00)
GFR calc Af Amer: 38 mL/min — ABNORMAL LOW (ref >60)
GFR calc non Af Amer: 33 mL/min — ABNORMAL LOW (ref >60)
Glucose, Bld: 106 mg/dL — ABNORMAL HIGH (ref 65–99)
Potassium: 3.7 mmol/L (ref 3.5–5.1)
Sodium: 140 mmol/L (ref 135–145)
Total Bilirubin: 0.8 mg/dL (ref 0.3–1.2)
Total Protein: 5.7 g/dL — ABNORMAL LOW (ref 6.5–8.1)

## 2016-04-06 LAB — I-STAT CG4 LACTIC ACID, ED
Lactic Acid, Venous: 1.79 mmol/L (ref 0.5–1.9)
Lactic Acid, Venous: 2.09 mmol/L (ref 0.5–1.9)

## 2016-04-06 LAB — GLUCOSE, CAPILLARY
Glucose-Capillary: 60 mg/dL — ABNORMAL LOW (ref 65–99)
Glucose-Capillary: 73 mg/dL (ref 65–99)

## 2016-04-06 MED ORDER — METOPROLOL SUCCINATE ER 100 MG PO TB24
100.0000 mg | ORAL_TABLET | Freq: Every day | ORAL | Status: DC
  Administered 2016-04-07 – 2016-04-10 (×4): 100 mg via ORAL
  Filled 2016-04-06 (×4): qty 1

## 2016-04-06 MED ORDER — BUMETANIDE 1 MG PO TABS
1.0000 mg | ORAL_TABLET | Freq: Every day | ORAL | Status: DC
  Administered 2016-04-07 – 2016-04-10 (×4): 1 mg via ORAL
  Filled 2016-04-06 (×4): qty 1

## 2016-04-06 MED ORDER — CEFEPIME HCL 2 G IJ SOLR
2.0000 g | INTRAMUSCULAR | Status: DC
  Administered 2016-04-06 – 2016-04-09 (×4): 2 g via INTRAVENOUS
  Filled 2016-04-06 (×4): qty 2

## 2016-04-06 MED ORDER — SODIUM CHLORIDE 0.9 % IV SOLN
1250.0000 mg | Freq: Once | INTRAVENOUS | Status: AC
  Administered 2016-04-06: 1250 mg via INTRAVENOUS
  Filled 2016-04-06: qty 1250

## 2016-04-06 MED ORDER — SODIUM CHLORIDE 0.9 % IV BOLUS (SEPSIS)
1000.0000 mL | Freq: Once | INTRAVENOUS | Status: AC
  Administered 2016-04-06: 1000 mL via INTRAVENOUS

## 2016-04-06 MED ORDER — SODIUM CHLORIDE 0.9% FLUSH: 3.0000 mL | Freq: Two times a day (BID) | INTRAVENOUS | Status: DC

## 2016-04-06 MED ORDER — DOCUSATE SODIUM 100 MG PO CAPS
100.0000 mg | ORAL_CAPSULE | Freq: Two times a day (BID) | ORAL | Status: DC
  Administered 2016-04-06 – 2016-04-10 (×8): 100 mg via ORAL
  Filled 2016-04-06 (×8): qty 1

## 2016-04-06 MED ORDER — VANCOMYCIN HCL 10 G IV SOLR: 1250.0000 mg | Freq: Once | INTRAVENOUS | Status: DC

## 2016-04-06 MED ORDER — PROCHLORPERAZINE MALEATE 10 MG PO TABS: 10.0000 mg | ORAL_TABLET | Freq: Four times a day (QID) | ORAL | Status: DC | PRN

## 2016-04-06 MED ORDER — INSULIN ASPART 100 UNIT/ML ~~LOC~~ SOLN
0.0000 [IU] | Freq: Three times a day (TID) | SUBCUTANEOUS | Status: DC
Start: 2016-04-07 — End: 2016-04-10
  Administered 2016-04-07: 4 [IU] via SUBCUTANEOUS
  Administered 2016-04-07: 7 [IU] via SUBCUTANEOUS
  Administered 2016-04-08: 4 [IU] via SUBCUTANEOUS
  Administered 2016-04-08: 7 [IU] via SUBCUTANEOUS
  Administered 2016-04-08 – 2016-04-09 (×3): 4 [IU] via SUBCUTANEOUS
  Administered 2016-04-09: 11 [IU] via SUBCUTANEOUS
  Administered 2016-04-10: 4 [IU] via SUBCUTANEOUS

## 2016-04-06 MED ORDER — HEPARIN SODIUM (PORCINE) 5000 UNIT/ML IJ SOLN
5000.0000 [IU] | Freq: Three times a day (TID) | INTRAMUSCULAR | Status: DC
  Administered 2016-04-06 – 2016-04-09 (×8): 5000 [IU] via SUBCUTANEOUS
  Filled 2016-04-06 (×9): qty 1

## 2016-04-06 MED ORDER — CLOPIDOGREL BISULFATE 75 MG PO TABS
75.0000 mg | ORAL_TABLET | Freq: Every day | ORAL | Status: DC
  Administered 2016-04-07 – 2016-04-10 (×4): 75 mg via ORAL
  Filled 2016-04-06 (×4): qty 1

## 2016-04-06 MED ORDER — TRAMADOL HCL 50 MG PO TABS: 50.0000 mg | ORAL_TABLET | Freq: Four times a day (QID) | ORAL | Status: DC | PRN

## 2016-04-06 MED ORDER — ACETAMINOPHEN 650 MG RE SUPP
650.0000 mg | Freq: Four times a day (QID) | RECTAL | Status: DC
  Filled 2016-04-06: qty 1

## 2016-04-06 MED ORDER — ROSUVASTATIN CALCIUM 20 MG PO TABS
40.0000 mg | ORAL_TABLET | Freq: Every day | ORAL | Status: DC
  Administered 2016-04-07 – 2016-04-10 (×4): 40 mg via ORAL
  Filled 2016-04-06 (×5): qty 2

## 2016-04-06 MED ORDER — SODIUM CHLORIDE 0.9% FLUSH
10.0000 mL | INTRAVENOUS | Status: DC | PRN
  Administered 2016-04-10 (×3): 10 mL
  Filled 2016-04-06 (×3): qty 40

## 2016-04-06 MED ORDER — SODIUM CHLORIDE 0.9 % IV SOLN
INTRAVENOUS | Status: AC
  Administered 2016-04-06: 21:00:00 via INTRAVENOUS

## 2016-04-06 MED ORDER — VANCOMYCIN HCL 10 G IV SOLR
1250.0000 mg | INTRAVENOUS | Status: DC
  Administered 2016-04-07 – 2016-04-09 (×3): 1250 mg via INTRAVENOUS
  Filled 2016-04-06 (×4): qty 1250

## 2016-04-06 MED ORDER — ACETAMINOPHEN 325 MG PO TABS
650.0000 mg | ORAL_TABLET | Freq: Four times a day (QID) | ORAL | Status: DC
  Administered 2016-04-06 – 2016-04-10 (×14): 650 mg via ORAL
  Filled 2016-04-06 (×15): qty 2

## 2016-04-06 MED ORDER — LORAZEPAM 1 MG PO TABS
1.0000 mg | ORAL_TABLET | Freq: Every day | ORAL | Status: DC
  Administered 2016-04-06 – 2016-04-09 (×4): 1 mg via ORAL
  Filled 2016-04-06 (×4): qty 1

## 2016-04-06 MED ORDER — SODIUM CHLORIDE 0.9% FLUSH: 3.0000 mL | INTRAVENOUS | Status: DC | PRN

## 2016-04-06 MED ORDER — ASPIRIN 81 MG PO CHEW
81.0000 mg | CHEWABLE_TABLET | Freq: Every day | ORAL | Status: DC
  Administered 2016-04-07 – 2016-04-10 (×4): 81 mg via ORAL
  Filled 2016-04-06 (×4): qty 1

## 2016-04-06 MED ORDER — DILTIAZEM LOAD VIA INFUSION: 15.0000 mg | Freq: Once | INTRAVENOUS | Status: DC

## 2016-04-06 MED ORDER — ONDANSETRON HCL 4 MG/2ML IJ SOLN: 4.0000 mg | Freq: Four times a day (QID) | INTRAMUSCULAR | Status: DC | PRN

## 2016-04-06 MED ORDER — SODIUM CHLORIDE 0.9 % IV SOLN: 250.0000 mL | INTRAVENOUS | Status: DC | PRN

## 2016-04-06 MED ORDER — LORATADINE 10 MG PO TABS
10.0000 mg | ORAL_TABLET | Freq: Every day | ORAL | Status: DC
  Administered 2016-04-07 – 2016-04-10 (×4): 10 mg via ORAL
  Filled 2016-04-06 (×4): qty 1

## 2016-04-06 MED ORDER — SUCRALFATE 1 GM/10ML PO SUSP: 1.0000 g | Freq: Three times a day (TID) | ORAL | Status: DC

## 2016-04-06 MED ORDER — ONDANSETRON HCL 4 MG PO TABS: 4.0000 mg | ORAL_TABLET | Freq: Four times a day (QID) | ORAL | Status: DC | PRN

## 2016-04-06 MED ORDER — INSULIN ASPART 100 UNIT/ML ~~LOC~~ SOLN
6.0000 [IU] | Freq: Three times a day (TID) | SUBCUTANEOUS | Status: DC
  Administered 2016-04-07 (×2): 6 [IU] via SUBCUTANEOUS

## 2016-04-06 MED ORDER — DILTIAZEM HCL 25 MG/5ML IV SOLN
15.0000 mg | Freq: Once | INTRAVENOUS | Status: AC
  Administered 2016-04-06: 15 mg via INTRAVENOUS
  Filled 2016-04-06: qty 5

## 2016-04-06 MED ORDER — OMEPRAZOLE MAGNESIUM 20 MG PO TBEC: 20.0000 mg | DELAYED_RELEASE_TABLET | Freq: Every day | ORAL | Status: DC | PRN

## 2016-04-06 MED ORDER — INSULIN ASPART 100 UNIT/ML ~~LOC~~ SOLN: 0.0000 [IU] | Freq: Every day | SUBCUTANEOUS | Status: DC

## 2016-04-06 MED ORDER — MOMETASONE FURO-FORMOTEROL FUM 200-5 MCG/ACT IN AERO
2.0000 | INHALATION_SPRAY | Freq: Two times a day (BID) | RESPIRATORY_TRACT | Status: DC
  Administered 2016-04-06 – 2016-04-10 (×7): 2 via RESPIRATORY_TRACT
  Filled 2016-04-06: qty 8.8

## 2016-04-06 MED ORDER — GABAPENTIN 300 MG PO CAPS
300.0000 mg | ORAL_CAPSULE | Freq: Three times a day (TID) | ORAL | Status: DC
  Administered 2016-04-06 – 2016-04-10 (×11): 300 mg via ORAL
  Filled 2016-04-06 (×11): qty 1

## 2016-04-06 NOTE — Assessment & Plan Note (Signed)
Patient presents to the [facility] today with erythema, warmth, tenderness, and mild edema to the left anterior lower leg.  She states she's also had a low-grade fever to a maximum of 100.4 overnight.  Of note-patient is a diabetic and also has a history of peripheral edema and takes Bumex as directed.  Exam today reveals cellulitis to the left anterior lower leg.  Trace cellulitis is questionable to the right anterior lower leg as well.  Patient has some apparent bruising./Pulling of blood to the left instep of her foot.  There is warmth, redness, mild tenderness, but no red streaks to the cellulitis.  Patient was afebrile while at the [facility] today.  Patient will receive vancomycin 1250 mg IV while at the cancer Center today.  She will return tomorrow for further evaluation of her cellulitis and a second day of vancomycin IV.  Patient was advised to call/return or go directly to the emergency department for any worsening symptoms whatsoever. _________________________________________________  Update 04/05/16:  Patient presents back to the [facility] today for recheck of her left anterior lower leg cellulitis.  She also reports she continues with low-grade fevers at home.  On exam today-patient's left lower extremity cellulitis.  Area appears slightly with more eythema; and there is increased erythema to the right anterior lower leg as well.  Patient denies any increased pain or edema to either leg.  Patient will proceed with the second day of vancomycin IV as directed.  She'll return tomorrow for recheck. ________________________________________________  Update 04/07/15:  Patient presented back for a recheck of her legs.  She states that overnight she has had an increased amount of edema to her bilateral lower legs.  Also, patient states that she now has significantly increased pain to the left lateral foot area.  She states she is now unable to ambulate well due to the pain.  She also  states that she had a fever to maximum 100.2 overnight again last night.  On exam today does appear that the erythema to the left anterior lower leg has slightly improved; and the right lower anterior leg erythema has slightly improved as well.  However, it does.  The patient has a great deal of increased edema to her bilateral lower extremities and there is increased tenderness to the left outer foot region with any palpation whatsoever.  Of note-patient continues with bruising to the instep several her feet.  It is questionable to this provider is the erythema to the bilateral anterior lower legs is actually cellulitis versus a large amount of petechial rash secondary to thrombocytopenia; given that patient has had 2 doses of IV vancomycin with minimal improvement.  However, patient continues with fevers overnight for the last few days.  Reviewed all findings with Dr. Alvy Bimler physician on call; and she recommended the patient be transported to the emergency department for further evaluation and management today.  Brief history.  Report were called to the emergency department charge nurse; prior to the patient being transported to the emergency department via wheelchair.  Per the cancer Center nurse.  CODE STATUS: Patient should be considered a full code; since there are no advanced directives in the patient's chart.

## 2016-04-06 NOTE — ED Provider Notes (Signed)
[facility] DEPT Provider Note   CSN: [id number] Arrival date & time: 04/06/16  1303     History   Chief Complaint Chief Complaint  Patient presents with  . Cellulitis    HPI Hannah [name] is a 70 y.o. female.  HPI Hannah [name] is a 70 y.o. female  with history with history of anemia, CHF, coronary artery disease, hypertension, breast cancer currently undergoing chemotherapy, presents to emergency department with complaint of left leg pain, redness, swelling. Patient states her redness and swelling started 3 days ago. She has been seen by Drue Second, nurse practitioner with oncology, the last 3 days for the cellulitis, and has been given vancomycin IV in the office. Today, her foot is more swollen than before, more painful, given no improvement with 2 IV doses of vancomycin, patient was sent here for possible admission and treatment for cellulitis. Patient reports pain to the foot, states redness to the foot and to the shin.  Past Medical History:  Diagnosis Date  . Abnormally small mouth   . Anemia   . Bilateral carotid artery stenosis    Bilateral ICA 40-59%/  >50% LECA  . Breast cancer ([place])   . Breast cancer of upper-inner quadrant of right female breast ([place]) 12/16/2015  . Bruises easily   . CAD (coronary artery disease) cardiologist-  dr Aundra Dubin   Remote CABG in 1998 with redo in 2009  . CHF (congestive heart failure) (HCC)    EF is low normal at 50 to 55% per echo in Jan. 2011  . Dyspnea   . GERD (gastroesophageal reflux disease)   . Hearing loss of right ear   . History of non-ST elevation myocardial infarction (NSTEMI)    Sept 2014--  thought to be type II HTN w/ LHC without infarct related artery and patent grafts  . Hyperlipidemia   . Hypertension   . Iron deficiency anemia   . Moderate persistent asthma    pulmologist-  Dr. Malvin Johns  . Nausea & vomiting    related to stone  . Nonischemic cardiomyopathy ([place])   . PAF (paroxysmal atrial fibrillation)  ([place])   . PONV (postoperative nausea and vomiting)    pt states patch worked well before  . Psoriasis    right leg  . Renal calculus, right   . Renal insufficiency, mild   . S/P AVR    prosthesis valve placement 2009 at same time re-do CABG  . 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 Type 2 diabetes mellitus ([place])    monitored by dr Dwyane Dee    Patient Active Problem List   Diagnosis Date Noted  . Cellulitis 04/06/2016  . 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 Mania ([place]) 03/05/2016  . GERD (gastroesophageal reflux disease) 02/29/2016  . Chemotherapy induced nausea and vomiting 02/24/2016  . Port catheter in place 02/17/2016  . Breast cancer of upper-inner quadrant of right female breast ([place]) 12/16/2015  . Acute bronchitis 08/01/2015  . CKD (chronic kidney disease) 09/24/2013  . Chronic diastolic CHF (congestive heart failure) ([place]) 09/24/2013  . Acute on chronic combined systolic and diastolic CHF, NYHA class 3 ([place]) 07/17/2013  . Chest pain 06/15/2013  . Unstable angina ([place]) 06/14/2013  . Atrial tachycardia ([place]) 04/03/2013  . Non-ST elevation myocardial infarction (NSTEMI), initial episode of care ([place]) 02/23/2013  . CAD (coronary artery disease) 01/24/2011  . S/P aortic valve replacement 10/27/2010  . Hypertension   . Hyperlipidemia   . SOB (shortness of breath)   .  CHF (congestive heart failure) ([place])   . Renal insufficiency, mild   . Dyspnea   . Nonischemic cardiomyopathy ([place])   . Anemia   . Diabetes mellitus, type 2 ([place])   . Iron deficiency anemia   . Allergic rhinitis 10/14/2009  . COUGH 10/14/2009  . HYPERLIPIDEMIA 05/12/2009  . HYPERTENSION 05/12/2009  . Atrial fibrillation ([place]) 05/12/2009  . Asthma 05/12/2009  . DYSPNEA 05/12/2009    Past Surgical History:  Procedure Laterality Date  . AORTIC VALVE REPLACEMENT  2009   #27mm [facility] Ease pericardial valve (done same time is CABG)  . BREAST LUMPECTOMY WITH RADIOACTIVE SEED AND  SENTINEL LYMPH NODE BIOPSY Right 01/18/2016   Procedure: RIGHT BREAST LUMPECTOMY WITH RADIOACTIVE SEED AND SENTINEL LYMPH NODE BIOPSY;  Surgeon: Alphonsa Overall, MD;  Location: [facility];  Service: General;  Laterality: Right;  . CARDIAC CATHETERIZATION  03/23/2008   Pre-redo CABG: L main OK, LAD (T), CFX (T), OM1 99%, RCA (T), LIMA-LAD OK, SVG-OM(?3) OK w/ little florw to OM2, SVG-RCA OK. EF NL  . CARDIOVASCULAR STRESS TEST  06-15-2013      dr Debara Pickett   [facility] perfusion study/  no evidence ischemia/  normal wall motion, ef 72%  . CARPAL TUNNEL RELEASE    . COLONOSCOPY    . CORONARY ARTERY BYPASS GRAFT  1998 &  re-do 2009   Had LIMA to DX/LAD, SVG to 2 marginal branches and SVG to [facility] originally; SVG to 3rd OM and PD at time of redo  . CYSTOSCOPY W/ URETERAL STENT PLACEMENT Right 12/20/2014   Procedure: CYSTOSCOPY WITH RETROGRADE PYELOGRAM/URETERAL STENT PLACEMENT;  Surgeon: Cleon Gustin, MD;  Location: [facility];  Service: Urology;  Laterality: Right;  . EYE SURGERY Bilateral    cataracts  . HOLMIUM LASER APPLICATION Right 10/02/5359   Procedure:  HOLMIUM LASER LITHOTRIPSY;  Surgeon: Cleon Gustin, MD;  Location[facility];  Service: Urology;  Laterality: Right;  . LEFT HEART CATHETERIZATION WITH CORONARY/GRAFT ANGIOGRAM N/A 02/23/2013   Procedure: LEFT HEART CATHETERIZATION WITH Beatrix Fetters;  Surgeon: Blane Ohara, MD;  Location: [facility] CATH LAB;  Service: Cardiovascular;  Laterality: N/A;  . PORTACATH PLACEMENT Left 01/18/2016   Procedure: INSERTION PORT-A-CATH;  Surgeon: Alphonsa Overall, MD;  Location: [place];  Service: General;  Laterality: Left;  . TONSILLECTOMY    . TRANSTHORACIC ECHOCARDIOGRAM  02-24-2013      mild LVH,  ef 50-55%/  AV bioprosthesis was present with very mild stenosis and no regurg., mean grandient 29mmHg, peak grandient 57mmHg /  mild MR/  mild LAE and RAE/  moderate TR  . TUBAL LIGATION      OB History    No data available         Home Medications    Prior to Admission medications   Medication Sig Start Date End Date Taking? Authorizing Provider  ACCU-CHEK AVIVA PLUS test strip  08/25/15   Historical Provider, MD  alendronate (FOSAMAX) 70 MG tablet Take 70 mg by mouth once a week. Take with a full glass of water on an empty stomach.    Historical Provider, MD  aspirin EC 81 MG EC tablet Take 1 tablet (81 mg total) by mouth daily. 06/16/13   Brittainy M Simmons, PA-C  budesonide-formoterol (SYMBICORT) 160-4.5 MCG/ACT inhaler INHALE TWO PUFFS INTO LUNGS TWICE DAILY 07/21/15   Collene Gobble, MD  bumetanide (BUMEX) 1 MG tablet Take 1 tablet (1 mg total) by mouth daily. 03/30/16   Nicholas Lose,  MD  clopidogrel (PLAVIX) 75 MG tablet Take 75 mg by mouth daily.    Historical Provider, MD  gabapentin (NEURONTIN) 300 MG capsule Take 1 capsule (300 mg total) by mouth 3 (three) times daily. 03/26/16   Nicholas Lose, MD  HUMALOG 100 UNIT/ML injection Inject 76 units daily with  V-GO pump 11/17/15   Elayne Snare, MD  lidocaine-prilocaine (EMLA) cream Apply to affected area once 01/26/16   Nicholas Lose, MD  loratadine (CLARITIN) 10 MG tablet Take 10 mg by mouth daily.    Historical Provider, MD  LORazepam (ATIVAN) 2 MG tablet Take 0.5 tablets (1 mg total) by mouth at bedtime. 03/30/16   Nicholas Lose, MD  metoprolol succinate (TOPROL-XL) 100 MG 24 hr tablet TAKE ONE TABLET BY MOUTH TWICE DAILY TAKE  WITH  OR  IMMEDIATELY  FOLLOWING  A  MEAL 06/08/15   Larey Dresser, MD  omeprazole (PRILOSEC OTC) 20 MG tablet Take 20 mg by mouth daily.    Historical Provider, MD  ondansetron (ZOFRAN) 8 MG tablet TAKE 1 TABLET BY MOUTH 2  TIMES DAILY AS NEEDED FOR  REFRACTORY NAUSEA /  VOMITING. START ON DAY 3  AFTER CHEMO. 03/14/16   Nicholas Lose, MD  prochlorperazine (COMPAZINE) 10 MG tablet Take 1 tablet (10 mg total) by mouth every 6 (six) hours as needed (Nausea or vomiting). 03/16/16   Nicholas Lose, MD  rosuvastatin (CRESTOR) 40 MG tablet TAKE ONE  TABLET BY MOUTH ONCE DAILY 10/25/15   Larey Dresser, MD  sucralfate (CARAFATE) 1 GM/10ML suspension Take 10 mLs (1 g total) by mouth 4 (four) times daily -  with meals and at bedtime. 02/28/16   Susanne Borders, NP  TraMADol HCl 200 MG CP24 Take 1 tablet by mouth daily as needed (for pain).  12/28/15   Historical Provider, MD  TRULICITY 6.23 JS/2.8BT SOPN Inject 0.75 mg into the skin every Wednesday.  12/30/15   Historical Provider, MD  ZETIA 10 MG tablet Take 1 tablet by mouth  daily 11/17/15   Elayne Snare, MD    Family History Family History  Problem Relation Age of Onset  . Heart disease Mother   . Heart disease Father   . Heart failure Father   . Diabetes Maternal Grandmother   . Diabetes Son     Social History Social History  Substance Use Topics  . Smoking status: Never Smoker  . Smokeless tobacco: Never Used  . Alcohol use No     Allergies   Amoxicillin; Imdur [isosorbide dinitrate]; Latex; and Tetracycline   Review of Systems Review of Systems  Constitutional: Positive for chills and fever.  Respiratory: Negative for cough, chest tightness and shortness of breath.   Cardiovascular: Positive for leg swelling. Negative for chest pain and palpitations.  Gastrointestinal: Negative for abdominal pain, diarrhea, nausea and vomiting.  Genitourinary: Negative for dysuria, flank pain and pelvic pain.  Musculoskeletal: Positive for arthralgias. Negative for myalgias, neck pain and neck stiffness.  Skin: Positive for color change. Negative for rash.  Neurological: Negative for dizziness, weakness and headaches.  All other systems reviewed and are negative.    Physical Exam Updated Vital Signs BP (!) 117/50 (BP Location: Right Arm)   Pulse 89   Temp 98.6 F (37 C) (Oral)   Resp 18   Ht 5\' 5"  (1.651 m)   Wt 96.2 kg   SpO2 97%   BMI 35.28 kg/m   Physical Exam  Constitutional: She appears well-developed and well-nourished. No distress.  HENT:  Head: Normocephalic.    Eyes: Conjunctivae are normal.  Neck: Neck supple.  Cardiovascular: Normal rate, regular rhythm and normal heart sounds.   Pulmonary/Chest: Effort normal and breath sounds normal. No respiratory distress. She has no wheezes. She has no rales.  Abdominal: Soft. Bowel sounds are normal. She exhibits no distension. There is no tenderness. There is no rebound.  Musculoskeletal: She exhibits edema.  Erythema to the left shin and circumference of the left sole. Diffuse foot tenderness. DP pulses intact. Cap refill <2 sec distally. Erythema is blanching. Foot and shin warm to the touch.  Neurological: She is alert.  Skin: Skin is warm and dry.  Psychiatric: She has a normal mood and affect. Her behavior is normal.  Nursing note and vitals reviewed.    ED Treatments / Results  Labs (all labs ordered are listed, but only abnormal results are displayed) Labs Reviewed  CBC WITH DIFFERENTIAL/PLATELET - Abnormal; Notable for the following:       Result Value   RBC 2.81 (*)    Hemoglobin 8.5 (*)    HCT 27.0 (*)    Platelets 122 (*)    All other components within normal limits  CULTURE, BLOOD (ROUTINE X 2)  CULTURE, BLOOD (ROUTINE X 2)  COMPREHENSIVE METABOLIC PANEL  I-STAT CG4 LACTIC ACID, ED    EKG  EKG Interpretation None       Radiology No results found.  Procedures Procedures (including critical care time)  Medications Ordered in ED Medications  sodium chloride 0.9 % bolus 1,000 mL (1,000 mLs Intravenous New Bag/Given 04/06/16 1411)     Initial Impression / Assessment and Plan / ED Course  I have reviewed the triage vital signs and the nursing notes.  Pertinent labs & imaging results that were available during my care of the patient were reviewed by me and considered in my medical decision making (see chart for details).  Clinical Course   Patient emergency department with worsening cellulitis of the left lower extremity, has had 2 vancomycin doses, one yesterday, one  the day before yesterday. Patient does have swelling to bilateral legs, with some erythema over the left shin and left foot. Neurovascularly intact. We'll get labs, will get venous Dopplers of the legs, patient is not tachycardic, not febrile, not hypertensive or tachypnea at this time, does not meet sirs criteria. Will get blood cultures and lactic acid however.   Normal blood cell count of 6.3. Lactic acid is 1.79. She is not neutropenic. Hemoglobin slightly decreased but has been lower in the past. Vancomycin ordered for cellulitis. Patient's venous Dopplers are negative. Patient's heart rates back up to 130s. EKG showing possible A. fib versus atria arrhythmia. Given a bolus of cardizem and 1L of NS. Pt does have significant swelling of bilateral LE, and hx of CHF, caution with hydration.   Spoke with hospitalist, will admit.   Vitals:   04/06/16 1530 04/06/16 1603 04/06/16 1618 04/06/16 1630  BP: (!) 109/42 (!) 98/42 (!) 106/47 (!) 100/47  Pulse: 105 104 110 86  Resp: 18 17 16 24   Temp:      TempSrc:      SpO2: 98% 96% 98% 100%  Weight:      Height:         Final Clinical Impressions(s) / ED Diagnoses   Final diagnoses:  Left leg cellulitis  Tachycardia    New Prescriptions New Prescriptions   No medications on file     Jeannett Senior, PA-C 04/06/16 1703  Margette Fast, MD 04/06/16 [phone]

## 2016-04-06 NOTE — Assessment & Plan Note (Signed)
Patient received cycle 3 of her Taxotere/cyclophosphamide chemotherapy on 03/30/2016.  Patient is scheduled to return on 04/20/2016 for labs, flush, visit, and her next cycle of chemotherapy.

## 2016-04-06 NOTE — Progress Notes (Signed)
1310 escorted to ED in wheelchair. Husband in attendance. Report given to  [facility].

## 2016-04-06 NOTE — Progress Notes (Signed)
*  Preliminary Results* Bilateral lower extremity venous duplex completed. Bilateral lower extremities are negative for deep vein thrombosis. There is no evidence of Baker's cyst bilaterally.  04/06/2016 4:24 PM Maudry Mayhew, BS, RVT, RDCS, RDMS

## 2016-04-06 NOTE — ED Triage Notes (Signed)
Patient is from [facility].  Patient has redness with heat and pain on left lower leg.  Some redness on right leg.  Patient was treated with Vancomycin on 10-25 and 26, but no relief.

## 2016-04-06 NOTE — Progress Notes (Signed)
Pharmacy Antibiotic Follow-up Note  Hannah [name] is a 70 y.o. year-old female admitted on 04/06/2016.  The patient is currently on day 3 of Vancomycin (prior doses at [facility]) & day 1 of Cefepime for worsening cellulitis in patient undergoing chemotherapy..  Assessment/Plan: Vancomycin 1250mg  IV every 24 hours.  Goal trough 10-15 mcg/mL. May need to aim for higher trough Cefepime 2gm IV q24hr  Temp (24hrs), Avg:99 F (37.2 C), Min:98.5 F (36.9 C), Max:99.5 F (37.5 C)   Recent Labs Lab 04/04/16 0850 04/06/16 1400  WBC 13.6* 6.3    Recent Labs Lab 04/04/16 0850 04/06/16 1400  CREATININE 1.4* 1.54*   Estimated Creatinine Clearance: 39.2 mL/min (by C-G formula based on SCr of 1.54 mg/dL (H)).    Allergies  Allergen Reactions  . Amoxicillin Rash    Has patient had a PCN reaction causing immediate rash, facial/tongue/throat swelling, SOB or lightheadedness with hypotension: No Has patient had a PCN reaction causing severe rash involving mucus membranes or skin necrosis: Yes Has patient had a PCN reaction that required hospitalization No Has patient had a PCN reaction occurring within the last 10 years: No If all of the above answers are "NO", then may proceed with Cephalosporin use.   . Imdur [Isosorbide Dinitrate] Other (See Comments)    Headache/severe hypotension/Syncope  . Latex Itching  . Tetracycline Rash   Antimicrobials this admission: 10/25 Vanc >>  10/27 Cefepime >>   Levels/dose changes this admission:  Microbiology results: 10/27 BCx: sent  Thank you for allowing pharmacy to be a part of this patient's care.  Minda Ditto PharmD 04/06/2016 6:52 PM

## 2016-04-06 NOTE — Assessment & Plan Note (Signed)
Patient presents to the [facility] today with erythema, warmth, tenderness, and mild edema to the left anterior lower leg.  She states she's also had a low-grade fever to a maximum of 100.4 overnight.  Of note-patient is a diabetic and also has a history of peripheral edema and takes Bumex as directed.  Exam today reveals cellulitis to the left anterior lower leg.  Trace cellulitis is questionable to the right anterior lower leg as well.  Patient has some apparent bruising./Pulling of blood to the left instep of her foot.  There is warmth, redness, mild tenderness, but no red streaks to the cellulitis.  Patient was afebrile while at the [facility] today.  Patient will receive vancomycin 1250 mg IV while at the cancer Center today.  She will return tomorrow for further evaluation of her cellulitis and a second day of vancomycin IV.  Patient was advised to call/return or go directly to the emergency department for any worsening symptoms whatsoever. _________________________________________________  Update 04/05/16:  Patient presents back to the [facility] today for recheck of her left anterior lower leg cellulitis.  She also reports she continues with low-grade fevers at home.  On exam today-patient's left lower extremity cellulitis.  Area appears slightly with more eythema; and there is increased erythema to the right anterior lower leg as well.  Patient denies any increased pain or edema to either leg.  Patient will proceed with the second day of vancomycin IV as directed.  She'll return tomorrow for recheck.

## 2016-04-06 NOTE — ED Notes (Signed)
Port was accessed at [facility]

## 2016-04-06 NOTE — Progress Notes (Signed)
SYMPTOM MANAGEMENT CLINIC    Chief Complaint: Cellulitis  HPI:  Hannah [name] 70 y.o. female diagnosed with breast cancer.  Currently undergoing Taxotere/cyclophosphamide chemotherapy regimen.     Breast cancer of upper-inner quadrant of right female breast ([place])   12/14/2015 Initial Diagnosis    Right breast biopsy 12:30 position: 2 masses, 1.6 cm mass: Invasive ductal carcinoma, grade 2, ER 0%, PR 0%, HER-2 negative, Ki-67 70%; satellite mass 8 mm: IDC grade 2 ER 5%, PR 5%, HER-2 negative, Ki-67 50%; T1cN0 stage IA clinical stage      01/18/2016 Surgery    Right lumpectomy: Multifocal IDC grade 3, 1.9 cm  ER 0%, PR 0%, HER-2 negative, Ki-67 70% and 0.8 cm satellite mass ER 5%, PR 2%, HER-2 negative, Ki-67 50%, high-grade DCIS, margins negative, 0/1 lymph nodes negative T1 cN0 stage IA      02/17/2016 -  Anti-estrogen oral therapy    Adjuvant chemotherapy with Taxotere and Cytoxan4       Review of Systems  Constitutional: Positive for fever and malaise/fatigue.  Cardiovascular: Positive for leg swelling.  Skin: Positive for rash.  All other systems reviewed and are negative.   Past Medical History:  Diagnosis Date  . Abnormally small mouth   . Anemia   . Bilateral carotid artery stenosis    Bilateral ICA 40-59%/  >50% LECA  . Breast cancer ([place])   . Breast cancer of upper-inner quadrant of right female breast ([place]) 12/16/2015  . Bruises easily   . CAD (coronary artery disease) cardiologist-  dr Aundra Dubin   Remote CABG in 1998 with redo in 2009  . CHF (congestive heart failure) (HCC)    EF is low normal at 50 to 55% per echo in Jan. 2011  . Dyspnea   . GERD (gastroesophageal reflux disease)   . Hearing loss of right ear   . History of non-ST elevation myocardial infarction (NSTEMI)    Sept 2014--  thought to be type II HTN w/ LHC without infarct related artery and patent grafts  . Hyperlipidemia   . Hypertension   . Iron deficiency anemia   . Moderate persistent asthma     pulmologist-  Dr. Malvin Johns  . Nausea & vomiting    related to stone  . Nonischemic cardiomyopathy ([place])   . PAF (paroxysmal atrial fibrillation) ([place])   . PONV (postoperative nausea and vomiting)    pt states patch worked well before  . Psoriasis    right leg  . Renal calculus, right   . Renal insufficiency, mild   . S/P AVR    prosthesis valve placement 2009 at same time re-do CABG  . 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 Type 2 diabetes mellitus ([place])    monitored by dr Dwyane Dee    Past Surgical History:  Procedure Laterality Date  . AORTIC VALVE REPLACEMENT  2009   [facility] (done same time is CABG)  . BREAST LUMPECTOMY WITH RADIOACTIVE SEED AND SENTINEL LYMPH NODE BIOPSY Right 01/18/2016   Procedure: RIGHT BREAST LUMPECTOMY WITH RADIOACTIVE SEED AND SENTINEL LYMPH NODE BIOPSY;  Surgeon: DAlphonsa Overall MD;  [facility]  Service: General;  Laterality: Right;  . CARDIAC CATHETERIZATION  03/23/2008   Pre-redo CABG: L main OK, LAD (T), CFX (T), OM1 99%, RCA (T), LIMA-LAD OK, SVG-OM(?3) OK w/ little florw to OM2, SVG-RCA OK. EF NL  . CARDIOVASCULAR STRESS TEST  06-15-2013      dr hDebara Pickett  Lexiscan perfusion study/  no evidence ischemia/  normal wall motion, ef 72%  . CARPAL TUNNEL RELEASE    . COLONOSCOPY    . CORONARY ARTERY BYPASS GRAFT  1998 &  re-do 2009   Had LIMA to DX/LAD, SVG to 2 marginal branches and SVG to [facility] originally; SVG to 3rd OM and PD at time of redo  . CYSTOSCOPY W/ URETERAL STENT PLACEMENT Right 12/20/2014   Procedure: CYSTOSCOPY WITH RETROGRADE PYELOGRAM/URETERAL STENT PLACEMENT;  Surgeon: Cleon Gustin, MD;  Location: [facility];  Service: Urology;  Laterality: Right;  . EYE SURGERY Bilateral    cataracts  . HOLMIUM LASER APPLICATION Right 1/77/9390   Procedure:  HOLMIUM LASER LITHOTRIPSY;  Surgeon: Cleon Gustin, MD;  Location: [facility];  Service: Urology;  Laterality: Right;    . LEFT HEART CATHETERIZATION WITH CORONARY/GRAFT ANGIOGRAM N/A 02/23/2013   Procedure: LEFT HEART CATHETERIZATION WITH Beatrix Fetters;  Surgeon: Blane Ohara, MD;  Location: [facility] C[facility] LAB;  Service: Cardiovascular;  Laterality: N/A;  . PORTACATH PLACEMENT Left 01/18/2016   Procedure: INSERTION PORT-A-CATH;  Surgeon: Alphonsa Overall, MD;  Location: [place];  Service: General;  Laterality: Left;  . TONSILLECTOMY    . TRANSTHORACIC ECHOCARDIOGRAM  02-24-2013      mild LVH,  ef 50-55%/  AV bioprosthesis was present with very mild stenosis and no regurg., mean grandient 60mHg, peak grandient 264mg /  mild MR/  mild LAE and RAE/  moderate TR  . TUBAL LIGATION      has HYPERLIPIDEMIA; HYPERTENSION; Atrial fibrillation ([place] Allergic rhinitis; Asthma; DYSPNEA; COUGH; Hypertension; Hyperlipidemia; SOB (shortness of breath); CHF (congestive heart failure) (HCEff[place] Renal insufficiency, mild; Dyspnea; Nonischemic cardiomyopathy ([place]CP[place] Anemia; Diabetes mellitus, type 2 ([place]CP[place] Iron deficiency anemia; S/P aortic valve replacement; CAD (coronary artery disease); Non-ST elevation myocardial infarction (NSTEMI), [facility] of [facility] Atrial tachycardia ([place] Unstable angina (HC[place] Chest pain; Acute on chronic combined systolic and diastolic CHF, NYHA class 3 ([place] CKD (chronic kidney disease); Chronic diastolic CHF (congestive heart failure) ([place]CCamp[place] Acute bronchitis; Breast cancer of upper-inner quadrant of right female breast ([place] catheter in place; Chemotherapy induced nausea and vomiting; GERD (gastroesophageal reflux disease); [place] (HCM[place] Cellulitis; and Chemotherapy-induced thrombocytopenia on her problem list.    is allergic to amoxicillin; imdur [isosorbide dinitrate]; latex; and tetracycline.    Medication List       Accurate as of 04/06/16 12:56 PM. Always use your most recent med list.          ACCU-CHEK AVIVA PLUS test strip Generic drug:  glucose blood   alendronate 70 MG  tablet Commonly known as:  FOSAMAX Take 70 mg by mouth once a week. Take with a full glass of water on an empty stomach.   aspirin 81 MG EC tablet Take 1 tablet (81 mg total) by mouth daily.   budesonide-formoterol 160-4.5 MCG/ACT inhaler Commonly known as:  SYMBICORT INHALE TWO PUFFS INTO LUNGS TWICE DAILY   bumetanide 1 MG tablet Commonly known as:  BUMEX Take 1 tablet (1 mg total) by mouth daily.   clopidogrel 75 MG tablet Commonly known as:  PLAVIX Take 75 mg by mouth daily.   gabapentin 300 MG capsule Commonly known as:  NEURONTIN Take 1 capsule (300 mg total) by mouth 3 (three) times daily.   HUMALOG 100 UNIT/ML injection Generic drug:  insulin lispro Inject 76 units daily with  V-GO pump   lidocaine-prilocaine cream Commonly known as:  EMLA Apply to affected area once  loratadine 10 MG tablet Commonly known as:  CLARITIN Take 10 mg by mouth daily.   LORazepam 2 MG tablet Commonly known as:  ATIVAN Take 0.5 tablets (1 mg total) by mouth at bedtime.   metoprolol succinate 100 MG 24 hr tablet Commonly known as:  TOPROL-XL TAKE ONE TABLET BY MOUTH TWICE DAILY TAKE  WITH  OR  IMMEDIATELY  FOLLOWING  A  MEAL   omeprazole 20 MG tablet Commonly known as:  PRILOSEC OTC Take 20 mg by mouth daily.   ondansetron 8 MG tablet Commonly known as:  ZOFRAN TAKE 1 TABLET BY MOUTH 2  TIMES DAILY AS NEEDED FOR  REFRACTORY NAUSEA /  VOMITING. START ON DAY 3  AFTER CHEMO.   prochlorperazine 10 MG tablet Commonly known as:  COMPAZINE Take 1 tablet (10 mg total) by mouth every 6 (six) hours as needed (Nausea or vomiting).   rosuvastatin 40 MG tablet Commonly known as:  CRESTOR TAKE ONE TABLET BY MOUTH ONCE DAILY   sucralfate 1 GM/10ML suspension Commonly known as:  CARAFATE Take 10 mLs (1 g total) by mouth 4 (four) times daily -  with meals and at bedtime.   TraMADol HCl 200 MG Cp24 Take 1 tablet by mouth daily as needed (for pain).   TRULICITY 6.30 ZS/0.1UX  Sopn Generic drug:  Dulaglutide Inject 0.75 mg into the skin every Wednesday.   ZETIA 10 MG tablet Generic drug:  ezetimibe Take 1 tablet by mouth  daily        PHYSICAL EXAMINATION  Oncology Vitals 04/05/2016 04/04/2016  Height 165 cm 165 cm  Weight 96.525 kg 95.029 kg  Weight (lbs) 212 lbs 13 oz 209 lbs 8 oz  BMI (kg/m2) 35.41 kg/m2 34.86 kg/m2  Temp 99 98.3  Pulse 93 92  Resp 18 17  SpO2 97 99  BSA (m2) 2.1 m2 2.09 m2   BP Readings from Last 2 Encounters:  04/05/16 (!) 103/44  04/04/16 (!) 147/65    Physical Exam  Constitutional: She is oriented to person, place, and time. She appears unhealthy.  HENT:  Head: Normocephalic and atraumatic.  Mouth/Throat: Oropharynx is clear and moist.  Eyes: Conjunctivae and EOM are normal. Pupils are equal, round, and reactive to light. Right eye exhibits no discharge. Left eye exhibits no discharge. No scleral icterus.  Neck: Normal range of motion. Neck supple. No JVD present. No tracheal deviation present. No thyromegaly present.  Cardiovascular: Normal rate, regular rhythm, normal heart sounds and intact distal pulses.   Pulmonary/Chest: Effort normal and breath sounds normal. No respiratory distress. She has no wheezes. She has no rales. She exhibits no tenderness.  Abdominal: Soft. Bowel sounds are normal. She exhibits no distension and no mass. There is no tenderness. There is no rebound and no guarding.  Musculoskeletal: Normal range of motion. She exhibits edema and tenderness.  Lymphadenopathy:    She has no cervical adenopathy.  Neurological: She is alert and oriented to person, place, and time. Gait normal.  Skin: Skin is warm and dry. No rash noted. No erythema. No pallor.  Exam today reveals cellulitis to the left anterior lower leg.  Trace cellulitis is questionable to the right anterior lower leg as well.  Patient has some apparent bruising./Pulling of blood to the left instep of her foot.  There is warmth, redness, mild  tenderness, but no red streaks to the cellulitis.  Patient was afebrile while at the [place] today.   Psychiatric: Affect normal.  Nursing note and vitals reviewed.  LABORATORY DATA:. Appointment on 04/04/2016  Component Date Value Ref Range Status  . WBC 04/04/2016 13.6* 3.9 - 10.3 10e3/uL Final  . NEUT# 04/04/2016 12.5* 1.5 - 6.5 10e3/uL Final  . HGB 04/04/2016 9.6* 11.6 - 15.9 g/dL Final  . HCT 04/04/2016 30.2* 34.8 - 46.6 % Final  . Platelets 04/04/2016 63 Few Large & giant platelets* 145 - 400 10e3/uL Final  . MCV 04/04/2016 93.2  79.5 - 101.0 fL Final  . MCH 04/04/2016 29.5  25.1 - 34.0 pg Final  . MCHC 04/04/2016 31.7  31.5 - 36.0 g/dL Final  . RBC 04/04/2016 3.24* 3.70 - 5.45 10e6/uL Final  . RDW 04/04/2016 15.7* 11.2 - 14.5 % Final  . lymph# 04/04/2016 0.4* 0.9 - 3.3 10e3/uL Final  . MONO# 04/04/2016 0.2  0.1 - 0.9 10e3/uL Final  . Eosinophils Absolute 04/04/2016 0.4  0.0 - 0.5 10e3/uL Final  . Basophils Absolute 04/04/2016 0.1  0.0 - 0.1 10e3/uL Final  . NEUT% 04/04/2016 91.8* 38.4 - 76.8 % Final  . LYMPH% 04/04/2016 2.7* 14.0 - 49.7 % Final  . MONO% 04/04/2016 1.8  0.0 - 14.0 % Final  . EOS% 04/04/2016 2.9  0.0 - 7.0 % Final  . BASO% 04/04/2016 0.8  0.0 - 2.0 % Final  . Sodium 04/04/2016 140  136 - 145 mEq/L Final  . Potassium 04/04/2016 4.5  3.5 - 5.1 mEq/L Final  . Chloride 04/04/2016 103  98 - 109 mEq/L Final  . CO2 04/04/2016 30* 22 - 29 mEq/L Final  . Glucose 04/04/2016 138  70 - 140 mg/dl Final  . BUN 04/04/2016 32.1* 7.0 - 26.0 mg/dL Final  . Creatinine 04/04/2016 1.4* 0.6 - 1.1 mg/dL Final  . Total Bilirubin 04/04/2016 0.94  0.20 - 1.20 mg/dL Final  . Alkaline Phosphatase 04/04/2016 88  40 - 150 U/L Final  . AST 04/04/2016 25  5 - 34 U/L Final  . ALT 04/04/2016 17  0 - 55 U/L Final  . Total Protein 04/04/2016 5.8* 6.4 - 8.3 g/dL Final  . Albumin 04/04/2016 2.4* 3.5 - 5.0 g/dL Final  . Calcium 04/04/2016 9.0  8.4 - 10.4 mg/dL Final  . Anion Gap  04/04/2016 8  3 - 11 mEq/L Final  . EGFR 04/04/2016 37* >90 ml/min/1.73 m2 Final   04/04/16:      04/06/16:          RADIOGRAPHIC STUDIES: No results found.  ASSESSMENT/PLAN:    Cellulitis Patient presents to the [facility] today with erythema, warmth, tenderness, and mild edema to the left anterior lower leg.  She states she's also had a low-grade fever to a maximum of 100.4 overnight.  Of note-patient is a diabetic and also has a history of peripheral edema and takes Bumex as directed.  Exam today reveals cellulitis to the left anterior lower leg.  Trace cellulitis is questionable to the right anterior lower leg as well.  Patient has some apparent bruising./Pulling of blood to the left instep of her foot.  There is warmth, redness, mild tenderness, but no red streaks to the cellulitis.  Patient was afebrile while at the [place] today.  Patient will receive vancomycin 1250 mg IV while at the cancer Center today.  She will return tomorrow for further evaluation of her cellulitis and a second day of vancomycin IV.  Patient was advised to call/return or go directly to the emergency department for any worsening symptoms whatsoever. _________________________________________________  Update 04/05/16:  Patient presents back to the [place]  today for recheck of her left anterior lower leg cellulitis.  She also reports she continues with low-grade fevers at home.  On exam today-patient's left lower extremity cellulitis.  Area appears slightly with more eythema; and there is increased erythema to the right anterior lower leg as well.  Patient denies any increased pain or edema to either leg.  Patient will proceed with the second day of vancomycin IV as directed.  She'll return tomorrow for recheck. ________________________________________________  Update 04/07/15:  Patient presented back for a recheck of her legs.  She states that overnight she has had an increased amount  of edema to her bilateral lower legs.  Also, patient states that she now has significantly increased pain to the left lateral foot area.  She states she is now unable to ambulate well due to the pain.  She also states that she had a fever to maximum 100.2 overnight again last night.  On exam today does appear that the erythema to the left anterior lower leg has slightly improved; and the right lower anterior leg erythema has slightly improved as well.  However, it does.  The patient has a great deal of increased edema to her bilateral lower extremities and there is increased tenderness to the left outer foot region with any palpation whatsoever.  Of note-patient continues with bruising to the instep several her feet.  It is questionable to this provider is the erythema to the bilateral anterior lower legs is actually cellulitis versus a large amount of petechial rash secondary to thrombocytopenia; given that patient has had 2 doses of IV vancomycin with minimal improvement.  However, patient continues with fevers overnight for the last few days.  Reviewed all findings with Dr. Alvy Bimler physician on call; and she recommended the patient be transported to the emergency department for further evaluation and management today.  Brief history.  Report were called to the emergency department charge nurse; prior to the patient being transported to the emergency department via wheelchair.  Per the cancer Center nurse.  CODE STATUS: Patient should be considered a full code; since there are no advanced directives in the patient's chart.  Breast cancer of upper-inner quadrant of right female breast [facility]) Patient received cycle 3 of her Taxotere/cyclophosphamide chemotherapy on 03/30/2016.  Patient is scheduled to return on 04/20/2016 for labs, flush, visit, and her next cycle of chemotherapy.   Patient stated understanding of all instructions; and was in agreement with this plan of care. The patient knows to call  the clinic with any problems, questions or concerns.   Total time spent with patient was 25 minutes;  with greater than 75 percent of that time spent in face to face counseling regarding patient's symptoms,  and coordination of care and follow up.  Disclaimer:This dictation was prepared with Dragon/digital dictation along with [employer]. Any transcriptional errors that result from this process are unintentional.  Drue Second, NP 04/06/2016

## 2016-04-06 NOTE — ED Notes (Signed)
Ultrasound in room. Will medicate when they finish.

## 2016-04-06 NOTE — Progress Notes (Signed)
SYMPTOM MANAGEMENT CLINIC    Chief Complaint: Cellulitis  HPI:  Hannah [name] 70 y.o. female diagnosed with breast cancer.  Currently undergoing Taxotere/cyclophosphamide chemotherapy regimen.     Breast cancer of upper-inner quadrant of right female breast ([place])   12/14/2015 Initial Diagnosis    Right breast biopsy 12:30 position: 2 masses, 1.6 cm mass: Invasive ductal carcinoma, grade 2, ER 0%, PR 0%, HER-2 negative, Ki-67 70%; satellite mass 8 mm: IDC grade 2 ER 5%, PR 5%, HER-2 negative, Ki-67 50%; T1cN0 stage IA clinical stage      01/18/2016 Surgery    Right lumpectomy: Multifocal IDC grade 3, 1.9 cm  ER 0%, PR 0%, HER-2 negative, Ki-67 70% and 0.8 cm satellite mass ER 5%, PR 2%, HER-2 negative, Ki-67 50%, high-grade DCIS, margins negative, 0/1 lymph nodes negative T1 cN0 stage IA      02/17/2016 -  Anti-estrogen oral therapy    Adjuvant chemotherapy with Taxotere and Cytoxan4       Review of Systems  Constitutional: Positive for fever and malaise/fatigue.  Cardiovascular: Positive for leg swelling.  Skin: Positive for rash.  All other systems reviewed and are negative.   Past Medical History:  Diagnosis Date  . Abnormally small mouth   . Anemia   . Bilateral carotid artery stenosis    Bilateral ICA 40-59%/  >50% LECA  . Breast cancer ([place])   . Breast cancer of upper-inner quadrant of right female breast ([place]) 12/16/2015  . Bruises easily   . CAD (coronary artery disease) cardiologist-  dr Aundra Dubin   Remote CABG in 1998 with redo in 2009  . CHF (congestive heart failure) (HCC)    EF is low normal at 50 to 55% per echo in Jan. 2011  . Dyspnea   . GERD (gastroesophageal reflux disease)   . Hearing loss of right ear   . History of non-ST elevation myocardial infarction (NSTEMI)    Sept 2014--  thought to be type II HTN w/ LHC without infarct related artery and patent grafts  . Hyperlipidemia   . Hypertension   . Iron deficiency anemia   . Moderate persistent asthma     pulmologist-  Dr. Malvin Johns  . Nausea & vomiting    related to stone  . Nonischemic cardiomyopathy ([place])   . PAF (paroxysmal atrial fibrillation) ([place])   . PONV (postoperative nausea and vomiting)    pt states patch worked well before  . Psoriasis    right leg  . Renal calculus, right   . Renal insufficiency, mild   . S/P AVR    prosthesis valve placement 2009 at same time re-do CABG  . 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 Type 2 diabetes mellitus ([place])    monitored by dr Dwyane Dee    Past Surgical History:  Procedure Laterality Date  . AORTIC VALVE RE[facility]ACEMENT  2009   [facility] same time is CABG)  . BREAST LUMPECTOMY WITH RADIOACTIVE SEED AND SENTINEL LYMPH NODE BIOPSY Right 01/18/2016   Procedure: RIGHT BREAST LUMPECTOMY WITH RADIOACTIVE SEED AND SENTINEL LYMPH NODE BIOPSY;  Surgeon: DAlphonsa Overall MD;  [place]  Service: General;  Laterality: Right;  . CARDIAC CATHETERIZATION  03/23/2008   Pre-redo CABG: L main OK, LAD (T), CFX (T), OM1 99%, RCA (T), LIMA-LAD OK, SVG-OM(?3) OK w/ little florw to OM2, SVG-RCA OK. EF NL  . CARDIOVASCULAR STRESS TEST  06-15-2013      dr hDebara Pickett  Lexiscan perfusion study/  no evidence ischemia/  normal wall motion, ef 72%  . CARPAL TUNNEL RELEASE    . COLONOSCOPY    . CORONARY ARTERY BYPASS GRAFT  1998 &  re-do 2009   Had LIMA to DX/LAD, SVG to 2 marginal branches and SVG to [place] originally; SVG to 3rd OM and PD at time of redo  . CYSTOSCOPY W/ URETERAL STENT PLACEMENT Right 12/20/2014   Procedure: CYSTOSCOPY WITH RETROGRADE PYELOGRAM/URETERAL STENT PLACEMENT;  Surgeon: Cleon Gustin, MD;  Location: [facility];  Service: Urology;  Laterality: Right;  . EYE SURGERY Bilateral    cataracts  . HOLMIUM LASER APPLICATION Right 8/56/3149   Procedure:  HOLMIUM LASER LITHOTRIPSY;  Surgeon: Cleon Gustin, MD;  Location: [facility];  Service: Urology;  Laterality: Right;    . LEFT HEART CATHETERIZATION WITH CORONARY/GRAFT ANGIOGRAM N/A 02/23/2013   Procedure: LEFT HEART CATHETERIZATION WITH Beatrix Fetters;  Surgeon: Blane Ohara, MD;  Location: [facility] CATH LAB;  Service: Cardiovascular;  Laterality: N/A;  . PORTACATH PLACEMENT Left 01/18/2016   Procedure: INSERTION PORT-A-CATH;  Surgeon: Alphonsa Overall, MD;  Location: [place];  Service: General;  Laterality: Left;  . TONSILLECTOMY    . TRANSTHORACIC ECHOCARDIOGRAM  02-24-2013      mild LVH,  ef 50-55%/  AV bioprosthesis was present with very mild stenosis and no regurg., mean grandient 38mHg, peak grandient 292mg /  mild MR/  mild LAE and RAE/  moderate TR  . TUBAL LIGATION      has HYPERLIPIDEMIA; HYPERTENSION; Atrial fibrillation ([place] Allergic rhinitis; Asthma; DYSPNEA; COUGH; Hypertension; Hyperlipidemia; SOB (shortness of breath); CHF (congestive heart failure) (HCK[place] Renal insufficiency, mild; Dyspnea; Nonischemic cardiomyopathy (HCDow[place] Anemia; Diabetes mellitus, type 2 (HCRose [place] Iron deficiency anemia; S/P aortic valve replacement; CAD (coronary artery disease); Non-ST elevation myocardial infarction (NSTEMI), [facility] Atrial tachycardia (HC[facility] Unstable angina (HCGordon [place] Chest pain; Acute on chronic combined systolic and diastolic CHF, NYHA class 3 (HCU[place] CKD (chronic kidney disease); Chronic diastolic CHF (congestive heart failure) ([place] Acute bronchitis; Breast cancer of upper-inner quadrant of right female breast ([facility] catheter in place; Chemotherapy induced nausea and vomiting; GERD (gastroesophageal reflux disease); [place] (HCNew [place] Cellulitis; and Chemotherapy-induced thrombocytopenia on her problem list.    is allergic to amoxicillin; imdur [isosorbide dinitrate]; latex; and tetracycline.    Medication List       Accurate as of 04/04/16 11:59 PM. Always use your most recent med list.          ACCU-CHEK AVIVA PLUS test strip Generic drug:  glucose blood   alendronate 70 MG  tablet Commonly known as:  FOSAMAX Take 70 mg by mouth once a week. Take with a full glass of water on an empty stomach.   aspirin 81 MG EC tablet Take 1 tablet (81 mg total) by mouth daily.   budesonide-formoterol 160-4.5 MCG/ACT inhaler Commonly known as:  SYMBICORT INHALE TWO PUFFS INTO LUNGS TWICE DAILY   bumetanide 1 MG tablet Commonly known as:  BUMEX Take 1 tablet (1 mg total) by mouth daily.   clopidogrel 75 MG tablet Commonly known as:  PLAVIX Take 75 mg by mouth daily.   gabapentin 300 MG capsule Commonly known as:  NEURONTIN Take 1 capsule (300 mg total) by mouth 3 (three) times daily.   HUMALOG 100 UNIT/ML injection Generic drug:  insulin lispro Inject 76 units daily with  V-GO pump   lidocaine-prilocaine cream Commonly known as:  EMLA Apply to affected area once  loratadine 10 MG tablet Commonly known as:  CLARITIN Take 10 mg by mouth daily.   LORazepam 2 MG tablet Commonly known as:  ATIVAN Take 0.5 tablets (1 mg total) by mouth at bedtime.   metoprolol succinate 100 MG 24 hr tablet Commonly known as:  TOPROL-XL TAKE ONE TABLET BY MOUTH TWICE DAILY TAKE  WITH  OR  IMMEDIATELY  FOLLOWING  A  MEAL   omeprazole 20 MG tablet Commonly known as:  PRILOSEC OTC Take 20 mg by mouth daily.   ondansetron 8 MG tablet Commonly known as:  ZOFRAN TAKE 1 TABLET BY MOUTH 2  TIMES DAILY AS NEEDED FOR  REFRACTORY NAUSEA /  VOMITING. START ON DAY 3  AFTER CHEMO.   prochlorperazine 10 MG tablet Commonly known as:  COMPAZINE Take 1 tablet (10 mg total) by mouth every 6 (six) hours as needed (Nausea or vomiting).   rosuvastatin 40 MG tablet Commonly known as:  CRESTOR TAKE ONE TABLET BY MOUTH ONCE DAILY   sucralfate 1 GM/10ML suspension Commonly known as:  CARAFATE Take 10 mLs (1 g total) by mouth 4 (four) times daily -  with meals and at bedtime.   TraMADol HCl 200 MG Cp24 Take 1 tablet by mouth daily as needed (for pain).   TRULICITY 9.67 EL/3.8BO  Sopn Generic drug:  Dulaglutide Inject 0.75 mg into the skin every Wednesday.   ZETIA 10 MG tablet Generic drug:  ezetimibe Take 1 tablet by mouth  daily        PHYSICAL EXAMINATION  Oncology Vitals 04/05/2016 04/04/2016  Height 165 cm 165 cm  Weight 96.525 kg 95.029 kg  Weight (lbs) 212 lbs 13 oz 209 lbs 8 oz  BMI (kg/m2) 35.41 kg/m2 34.86 kg/m2  Temp 99 98.3  Pulse 93 92  Resp 18 17  SpO2 97 99  BSA (m2) 2.1 m2 2.09 m2   BP Readings from Last 2 Encounters:  04/05/16 (!) 103/44  04/04/16 (!) 147/65    Physical Exam  Constitutional: She is oriented to person, place, and time. She appears unhealthy.  HENT:  Head: Normocephalic and atraumatic.  Mouth/Throat: Oropharynx is clear and moist.  Eyes: Conjunctivae and EOM are normal. Pupils are equal, round, and reactive to light. Right eye exhibits no discharge. Left eye exhibits no discharge. No scleral icterus.  Neck: Normal range of motion. Neck supple. No JVD present. No tracheal deviation present. No thyromegaly present.  Cardiovascular: Normal rate, regular rhythm, normal heart sounds and intact distal pulses.   Pulmonary/Chest: Effort normal and breath sounds normal. No respiratory distress. She has no wheezes. She has no rales. She exhibits no tenderness.  Abdominal: Soft. Bowel sounds are normal. She exhibits no distension and no mass. There is no tenderness. There is no rebound and no guarding.  Musculoskeletal: Normal range of motion. She exhibits edema and tenderness.  Lymphadenopathy:    She has no cervical adenopathy.  Neurological: She is alert and oriented to person, place, and time. Gait normal.  Skin: Skin is warm and dry. No rash noted. No erythema. No pallor.  Exam today reveals cellulitis to the left anterior lower leg.  Trace cellulitis is questionable to the right anterior lower leg as well.  Patient has some apparent bruising./Pulling of blood to the left instep of her foot.  There is warmth, redness, mild  tenderness, but no red streaks to the cellulitis.  Patient was afebrile while at the [facility] today.   Psychiatric: Affect normal.  Nursing note and vitals reviewed.  LABORATORY DATA:. Appointment on 04/04/2016  Component Date Value Ref Range Status  . WBC 04/04/2016 13.6* 3.9 - 10.3 10e3/uL Final  . NEUT# 04/04/2016 12.5* 1.5 - 6.5 10e3/uL Final  . HGB 04/04/2016 9.6* 11.6 - 15.9 g/dL Final  . HCT 04/04/2016 30.2* 34.8 - 46.6 % Final  . Platelets 04/04/2016 63 Few Large & giant platelets* 145 - 400 10e3/uL Final  . MCV 04/04/2016 93.2  79.5 - 101.0 fL Final  . MCH 04/04/2016 29.5  25.1 - 34.0 pg Final  . MCHC 04/04/2016 31.7  31.5 - 36.0 g/dL Final  . RBC 04/04/2016 3.24* 3.70 - 5.45 10e6/uL Final  . RDW 04/04/2016 15.7* 11.2 - 14.5 % Final  . lymph# 04/04/2016 0.4* 0.9 - 3.3 10e3/uL Final  . MONO# 04/04/2016 0.2  0.1 - 0.9 10e3/uL Final  . Eosinophils Absolute 04/04/2016 0.4  0.0 - 0.5 10e3/uL Final  . Basophils Absolute 04/04/2016 0.1  0.0 - 0.1 10e3/uL Final  . NEUT% 04/04/2016 91.8* 38.4 - 76.8 % Final  . LYMPH% 04/04/2016 2.7* 14.0 - 49.7 % Final  . MONO% 04/04/2016 1.8  0.0 - 14.0 % Final  . EOS% 04/04/2016 2.9  0.0 - 7.0 % Final  . BASO% 04/04/2016 0.8  0.0 - 2.0 % Final  . Sodium 04/04/2016 140  136 - 145 mEq/L Final  . Potassium 04/04/2016 4.5  3.5 - 5.1 mEq/L Final  . Chloride 04/04/2016 103  98 - 109 mEq/L Final  . CO2 04/04/2016 30* 22 - 29 mEq/L Final  . Glucose 04/04/2016 138  70 - 140 mg/dl Final  . BUN 04/04/2016 32.1* 7.0 - 26.0 mg/dL Final  . Creatinine 04/04/2016 1.4* 0.6 - 1.1 mg/dL Final  . Total Bilirubin 04/04/2016 0.94  0.20 - 1.20 mg/dL Final  . Alkaline Phosphatase 04/04/2016 88  40 - 150 U/L Final  . AST 04/04/2016 25  5 - 34 U/L Final  . ALT 04/04/2016 17  0 - 55 U/L Final  . Total Protein 04/04/2016 5.8* 6.4 - 8.3 g/dL Final  . Albumin 04/04/2016 2.4* 3.5 - 5.0 g/dL Final  . Calcium 04/04/2016 9.0  8.4 - 10.4 mg/dL Final  . Anion Gap  04/04/2016 8  3 - 11 mEq/L Final  . EGFR 04/04/2016 37* >90 ml/min/1.73 m2 Final        RADIOGRAPHIC STUDIES: No results found.  ASSESSMENT/PLAN:    Renal insufficiency, mild Patient has history of chronic renal insufficiency.  Creatinine has increased to 1.4.  Will continue to monitor closely.  Chemotherapy-induced thrombocytopenia Patient received her last cycle of chemotherapy on 03/30/2016.  Blood counts obtained today reveal a WBC of 13.6, ANC 12.5, hemoglobin 9.6, platelet count has decreased from 152 down to 63.  Patient presented to the [facility] today with complaint of cellulitis to her left anterior lower leg.  Exam today revealed some questionable pooling of blood to the in her left foot as well-most likely secondary to the low platelet count.  Patient denies any issues with easy bleeding or bruising today.  We'll continue to monitor closely.  Cellulitis Patient presents to the [facility] today with erythema, warmth, tenderness, and mild edema to the left anterior lower leg.  She states she's also had a low-grade fever to a maximum of 100.4 overnight.  Of note-patient is a diabetic and also has a history of peripheral edema and takes Bumex as directed.  Exam today reveals cellulitis to the left anterior lower leg.  Trace cellulitis is questionable to the right  anterior lower leg as well.  Patient has some apparent bruising./Pulling of blood to the left instep of her foot.  There is warmth, redness, mild tenderness, but no red streaks to the cellulitis.  Patient was afebrile while at the [place] today.  Patient will receive vancomycin 1250 mg IV while at the cancer Center today.  She will return tomorrow for further evaluation of her cellulitis and a second day of vancomycin IV.  Patient was advised to call/return or go directly to the emergency department for any worsening symptoms whatsoever.  Breast cancer of upper-inner quadrant of right female breast  [facility]) Patient received cycle 3 of her Taxotere/cyclophosphamide chemotherapy on 03/30/2016.  Patient is scheduled to return on 04/20/2016 for labs, flush, visit, and her next cycle of chemotherapy.   Patient stated understanding of all instructions; and was in agreement with this plan of care. The patient knows to call the clinic with any problems, questions or concerns.   Total time spent with patient was 40 minutes;  with greater than 75 percent of that time spent in face to face counseling regarding patient's symptoms,  and coordination of care and follow up.  Disclaimer:This dictation was prepared with Dragon/digital dictation along with [employer]. Any transcriptional errors that result from this process are unintentional.  Drue Second, NP 04/06/2016

## 2016-04-06 NOTE — Assessment & Plan Note (Signed)
Patient has history of chronic renal insufficiency.  Creatinine has increased to 1.4.  Will continue to monitor closely.

## 2016-04-06 NOTE — Assessment & Plan Note (Signed)
Patient received her last cycle of chemotherapy on 03/30/2016.  Blood counts obtained today reveal a WBC of 13.6, ANC 12.5, hemoglobin 9.6, platelet count has decreased from 152 down to 63.  Patient presented to the [facility] today with complaint of cellulitis to her left anterior lower leg.  Exam today revealed some questionable pooling of blood to the in her left foot as well-most likely secondary to the low platelet count.  Patient denies any issues with easy bleeding or bruising today.  We'll continue to monitor closely.

## 2016-04-06 NOTE — H&P (Signed)
History and Physical  [name] QMG:[id number] DOB: [birth year] DOA: 04/06/2016  Referring physician:  PCP: Nicola Girt, DO  Outpatient Specialists:  1.   Chief Complaint: left leg infection  HPI: Hannah [name] is a 70 y.o. female with history with history of anemia, CHF, coronary artery disease, hypertension, breast cancer currently undergoing chemotherapy, presents to emergency department with complaint of left leg pain, redness, swelling. Patient states her redness and swelling started 3 days ago. She has been seen by Drue Second, nurse practitioner with oncology in the last 3 days for the cellulitis, and has been given vancomycin IV in the office. She initially did have an elevated white blood cell count that did improve with IV vancomycin. However clinically the left leg continue to cause pain and discomfort and redness spread. Today, her foot is more swollen than before, more painful, given no improvement with 2 IV doses of vancomycin, patient was sent here for possible admission and treatment for cellulitis. Patient reports pain to the foot, states redness to the foot and to the shin. The patient denies fever and chills. The patient was started on IV vancomycin in the ED and admission was requested.  Review of Systems:  Constitutional: Positive for chills and fever.  Respiratory: Negative for cough, chest tightness and shortness of breath.   Cardiovascular: Positive for leg swelling. Negative for chest pain and palpitations.  Gastrointestinal: Negative for abdominal pain, diarrhea, nausea and vomiting.  Genitourinary: Negative for dysuria, flank pain and pelvic pain.  Musculoskeletal: Positive for arthralgias. Negative for myalgias, neck pain and neck stiffness.  Skin: Positive for color change. Negative for rash.  Neurological: Negative for dizziness, weakness and headaches.  All other systems reviewed and are negative.  Past Medical History:  Diagnosis Date  .  Abnormally small mouth   . Anemia   . Bilateral carotid artery stenosis    Bilateral ICA 40-59%/  >50% LECA  . Breast cancer ([place])   . Breast cancer of upper-inner quadrant of right female breast ([place]) 12/16/2015  . Bruises easily   . CAD (coronary artery disease) cardiologist-  dr Aundra Dubin   Remote CABG in 1998 with redo in 2009  . CHF (congestive heart failure) (HCC)    EF is low normal at 50 to 55% per echo in Jan. 2011  . Dyspnea   . GERD (gastroesophageal reflux disease)   . Hearing loss of right ear   . History of non-ST elevation myocardial infarction (NSTEMI)    Sept 2014--  thought to be type II HTN w/ LHC without infarct related artery and patent grafts  . Hyperlipidemia   . Hypertension   . Iron deficiency anemia   . Moderate persistent asthma    pulmologist-  Dr. Malvin Johns  . Nausea & vomiting    related to stone  . Nonischemic cardiomyopathy ([place])   . PAF (paroxysmal atrial fibrillation) ([place])   . PONV (postoperative nausea and vomiting)    pt states patch worked well before  . Psoriasis    right leg  . Renal calculus, right   . Renal insufficiency, mild   . S/P AVR    prosthesis valve placement 2009 at same time re-do CABG  . 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 Type 2 diabetes mellitus ([place])    monitored by dr Dwyane Dee   Past Surgical History:  Procedure Laterality Date  . AORTIC VALVE REPLACEMENT  2009   #78mm [facility] Ease pericardial valve (  done same time is CABG)  . BREAST LUMPECTOMY WITH RADIOACTIVE SEED AND SENTINEL LYMPH NODE BIOPSY Right 01/18/2016   Procedure: RIGHT BREAST LUMPECTOMY WITH RADIOACTIVE SEED AND SENTINEL LYMPH NODE BIOPSY;  Surgeon: Alphonsa Overall, MD;  Location: [place];  Service: General;  Laterality: Right;  . CARDIAC CATHETERIZATION  03/23/2008   Pre-redo CABG: L main OK, LAD (T), CFX (T), OM1 99%, RCA (T), LIMA-LAD OK, SVG-OM(?3) OK w/ little florw to OM2, SVG-RCA OK. EF NL  . CARDIOVASCULAR STRESS TEST  06-15-2013      dr Debara Pickett    [facility] perfusion study/  no evidence ischemia/  normal wall motion, ef 72%  . CARPAL TUNNEL RELEASE    . COLONOSCOPY    . CORONARY ARTERY BYPASS GRAFT  1998 &  re-do 2009   Had LIMA to DX/LAD, SVG to 2 marginal branches and SVG to [facility] originally; SVG to 3rd OM and PD at time of redo  . CYSTOSCOPY W/ URETERAL STENT PLACEMENT Right 12/20/2014   Procedure: CYSTOSCOPY WITH RETROGRADE PYELOGRAM/URETERAL STENT PLACEMENT;  Surgeon: Cleon Gustin, MD;  Location: [facility];  Service: Urology;  Laterality: Right;  . EYE SURGERY Bilateral    cataracts  . HOLMIUM LASER APPLICATION Right 5/80/9983   Procedure:  HOLMIUM LASER LITHOTRIPSY;  Surgeon: Cleon Gustin, MD;  Location[facility];  Service: Urology;  Laterality: Right;  . LEFT HEART CATHETERIZATION WITH CORONARY/GRAFT ANGIOGRAM N/A 02/23/2013   Procedure: LEFT HEART CATHETERIZATION WITH Beatrix Fetters;  Surgeon: Blane Ohara, MD;  Location: [facility] CATH LAB;  Service: Cardiovascular;  Laterality: N/A;  . PORTACATH PLACEMENT Left 01/18/2016   Procedure: INSERTION PORT-A-CATH;  Surgeon: Alphonsa Overall, MD;  Location: [place];  Service: General;  Laterality: Left;  . TONSILLECTOMY    . TRANSTHORACIC ECHOCARDIOGRAM  02-24-2013      mild LVH,  ef 50-55%/  AV bioprosthesis was present with very mild stenosis and no regurg., mean grandient 46mmHg, peak grandient 82mmHg /  mild MR/  mild LAE and RAE/  moderate TR  . TUBAL LIGATION     Social History:  reports that she has never smoked. She has never used smokeless tobacco. She reports that she does not drink alcohol or use drugs.   Allergies  Allergen Reactions  . Amoxicillin Rash    Has patient had a PCN reaction causing immediate rash, facial/tongue/throat swelling, SOB or lightheadedness with hypotension: No Has patient had a PCN reaction causing severe rash involving mucus membranes or skin necrosis: Yes Has patient had a PCN reaction that required  hospitalization No Has patient had a PCN reaction occurring within the last 10 years: No If all of the above answers are "NO", then may proceed with Cephalosporin use.   . Imdur [Isosorbide Dinitrate] Other (See Comments)    Headache/severe hypotension/Syncope  . Latex Itching  . Tetracycline Rash    Family History  Problem Relation Age of Onset  . Heart disease Mother   . Heart disease Father   . Heart failure Father   . Diabetes Maternal Grandmother   . Diabetes Son     Prior to Admission medications   Medication Sig Start Date End Date Taking? Authorizing Provider  aspirin EC 81 MG EC tablet Take 1 tablet (81 mg total) by mouth daily. 06/16/13  Yes Brittainy Erie Noe, PA-C  budesonide-formoterol (SYMBICORT) 160-4.5 MCG/ACT inhaler INHALE TWO PUFFS INTO LUNGS TWICE DAILY 07/21/15  Yes Collene Gobble, MD  bumetanide (BUMEX) 1 MG tablet Take  1 tablet (1 mg total) by mouth daily. 03/30/16  Yes Nicholas Lose, MD  clopidogrel (PLAVIX) 75 MG tablet Take 75 mg by mouth daily.   Yes Historical Provider, MD  gabapentin (NEURONTIN) 300 MG capsule Take 1 capsule (300 mg total) by mouth 3 (three) times daily. 03/26/16  Yes Nicholas Lose, MD  HUMALOG 100 UNIT/ML injection Inject 76 units daily with  V-GO pump 11/17/15  Yes Elayne Snare, MD  lidocaine-prilocaine (EMLA) cream Apply to affected area once 01/26/16  Yes Nicholas Lose, MD  loratadine (CLARITIN) 10 MG tablet Take 10 mg by mouth daily.   Yes Historical Provider, MD  LORazepam (ATIVAN) 2 MG tablet Take 0.5 tablets (1 mg total) by mouth at bedtime. 03/30/16  Yes Nicholas Lose, MD  metoprolol succinate (TOPROL-XL) 100 MG 24 hr tablet TAKE ONE TABLET BY MOUTH TWICE DAILY TAKE  WITH  OR  IMMEDIATELY  FOLLOWING  A  MEAL 06/08/15  Yes Larey Dresser, MD  ondansetron (ZOFRAN) 8 MG tablet TAKE 1 TABLET BY MOUTH 2  TIMES DAILY AS NEEDED FOR  REFRACTORY NAUSEA /  VOMITING. START ON DAY 3  AFTER CHEMO. 03/14/16  Yes Nicholas Lose, MD  rosuvastatin (CRESTOR) 40  MG tablet TAKE ONE TABLET BY MOUTH ONCE DAILY 10/25/15  Yes Larey Dresser, MD  ZETIA 10 MG tablet Take 1 tablet by mouth  daily 11/17/15  Yes Elayne Snare, MD  ACCU-CHEK AVIVA PLUS test strip  08/25/15   Historical Provider, MD  alendronate (FOSAMAX) 70 MG tablet Take 70 mg by mouth once a week. Take with a full glass of water on an empty stomach.    Historical Provider, MD  gabapentin (NEURONTIN) 100 MG capsule  03/26/16   Historical Provider, MD  LORazepam (ATIVAN) 0.5 MG tablet  03/12/16   Historical Provider, MD  omeprazole (PRILOSEC OTC) 20 MG tablet Take 20 mg by mouth daily as needed (heartburn).     Historical Provider, MD  prochlorperazine (COMPAZINE) 10 MG tablet Take 1 tablet (10 mg total) by mouth every 6 (six) hours as needed (Nausea or vomiting). 03/16/16   Nicholas Lose, MD  sucralfate (CARAFATE) 1 GM/10ML suspension Take 10 mLs (1 g total) by mouth 4 (four) times daily -  with meals and at bedtime. 02/28/16   Susanne Borders, NP  TraMADol HCl 200 MG CP24 Take 1 tablet by mouth daily as needed (for pain).  12/28/15   Historical Provider, MD  TRULICITY 3.15 VV/6.1YW SOPN Inject 0.75 mg into the skin every Wednesday.  12/30/15   Historical Provider, MD   Physical Exam: Vitals:   04/06/16 1603 04/06/16 1618 04/06/16 1630 04/06/16 1700  BP: (!) 98/42 (!) 106/47 (!) 100/47 (!) 108/42  Pulse: 104 110 86 93  Resp: 17 16 24 17   Temp:      TempSrc:      SpO2: 96% 98% 100% 97%  Weight:      Height:        Constitutional: She appears well-developed and well-nourished. No distress.  HENT: Head: Normocephalic.  Eyes: Conjunctivae are normal.  Neck: Neck supple.       Cardiovascular: Normal rate, regular rhythm and normal heart sounds.   Pulmonary/Chest: Effort normal and breath sounds normal. No respiratory distress. She has no wheezes. She has no rales.  Abdominal: Soft. Bowel sounds are normal. She exhibits no distension. There is no tenderness. There is no rebound.  Musculoskeletal:  She exhibits edema. Erythema to the left shin and circumference of the left  sole. Diffuse foot tenderness. DP pulses intact. Cap refill <2 sec distally. Erythema is blanching. Foot and shin warm to the touch.  Neurological: She is alert.  Skin: Skin is warm and dry.  Psychiatric: She has a normal mood and affect. Her behavior is normal.   Labs on Admission:  Basic Metabolic Panel:  Recent Labs Lab 04/04/16 0850 04/06/16 1400  NA 140 140  K 4.5 3.7  CL  --  105  CO2 30* 29  GLUCOSE 138 106*  BUN 32.1* 29*  CREATININE 1.4* 1.54*  CALCIUM 9.0 8.5*   Liver Function Tests:  Recent Labs Lab 04/04/16 0850 04/06/16 1400  AST 25 41  ALT 17 25  ALKPHOS 88 65  BILITOT 0.94 0.8  PROT 5.8* 5.7*  ALBUMIN 2.4* 2.6*   No results for input(s): LIPASE, AMYLASE in the last 168 hours. No results for input(s): AMMONIA in the last 168 hours. CBC:  Recent Labs Lab 04/04/16 0850 04/06/16 1400  WBC 13.6* 6.3  NEUTROABS 12.5* 3.5  HGB 9.6* 8.5*  HCT 30.2* 27.0*  MCV 93.2 96.1  PLT 63 Few Large & giant platelets* 122*   Cardiac Enzymes: No results for input(s): CKTOTAL, CKMB, CKMBINDEX, TROPONINI in the last 168 hours.  BNP (last 3 results) No results for input(s): PROBNP in the last 8760 hours. CBG: No results for input(s): GLUCAP in the last 168 hours.  Radiological Exams on Admission: No results found.  EKG: Independently reviewed.  Assessment/Plan Principal Problem:   Cellulitis of left leg Active Problems:   Atrial fibrillation (HCC)   Hypertension   CHF (congestive heart failure) (HCC)   Nonischemic cardiomyopathy (HCC)   Diabetes mellitus, type 2 (HCC)   S/P aortic valve replacement   CAD (coronary artery disease)   Atrial tachycardia (HCC)   Unstable angina (HCC)   CKD (chronic kidney disease)   Chronic diastolic CHF (congestive heart failure) (HCC)   Breast cancer of upper-inner quadrant of right female breast ([place])   Chemotherapy induced nausea and  vomiting   GERD (gastroesophageal reflux disease)   Chemotherapy-induced thrombocytopenia  1. Cellulitis of left leg - failed outpatient management, however there has been some improvement since starting vancomycin as evidenced by the improvement in the white blood cell count. Given that patient is diabetic will start with cefepime and vancomycin. Elevate leg. Monitor blood cultures. Swab for MRSA. Mildly elevated lactic acid but no overt signs or symptoms of sepsis, monitor clinically and gently hydrate. 2. Insulin-dependent diabetes mellitus-will stop insulin pump while in the hospital and treat with sliding scale coverage, close monitoring of blood glucose. Trulicity on board. Plan to start basal insulin if needed. Carbohydrate modified diet.  Blood sugars have been low, started hypoglycemia precautions and encouraged eating.  Follow and treat as needed.  3. Atrial fibrillation-resume home medications,  current rate has been labile but responded well to IV diltiazem given in ED.  Monitor on telemetry.   4. CAD - no chest pain, resume home meds, follow.  5. CKD - renally dose medications, follow BMP.   6. GERD - pantoprazole ordered for GI protection.  7. NICM/Chronic diastolic CHF - gently hydrate.  Monitor weight, I/Os.  Currently appears well compensated.   8. Dyslipidemia - resume home crestor.  9. Local wound care ordered for LLE.     DVT Prophylaxis: heparin Code Status: full  Family Communication: husband at bedside  Disposition Plan: home   Irwin Brakeman, MD [facility] Pager [phone]  If 7PM-7AM, please contact night-coverage  [email or web address] Password [facility] 04/06/2016, 5:43 PM

## 2016-04-06 NOTE — ED Notes (Signed)
Bed: WA03 Expected date:  Expected time:  Means of arrival:  Comments: Ca pt

## 2016-04-06 NOTE — Assessment & Plan Note (Signed)
Patient presents to the [facility] today with erythema, warmth, tenderness, and mild edema to the left anterior lower leg.  She states she's also had a low-grade fever to a maximum of 100.4 overnight.  Of note-patient is a diabetic and also has a history of peripheral edema and takes Bumex as directed.  Exam today reveals cellulitis to the left anterior lower leg.  Trace cellulitis is questionable to the right anterior lower leg as well.  Patient has some apparent bruising./Pulling of blood to the left instep of her foot.  There is warmth, redness, mild tenderness, but no red streaks to the cellulitis.  Patient was afebrile while at the [facility] today.  Patient will receive vancomycin 1250 mg IV while at the cancer Center today.  She will return tomorrow for further evaluation of her cellulitis and a second day of vancomycin IV.  Patient was advised to call/return or go directly to the emergency department for any worsening symptoms whatsoever.

## 2016-04-06 NOTE — Progress Notes (Signed)
SYMPTOM MANAGEMENT CLINIC    Chief Complaint: Cellulitis  HPI:  Hannah [name] 70 y.o. female diagnosed with breast cancer.  Currently undergoing Taxotere/cyclophosphamide chemotherapy regimen.     Breast cancer of upper-inner quadrant of right female breast ([place])   12/14/2015 Initial Diagnosis    Right breast biopsy 12:30 position: 2 masses, 1.6 cm mass: Invasive ductal carcinoma, grade 2, ER 0%, PR 0%, HER-2 negative, Ki-67 70%; satellite mass 8 mm: IDC grade 2 ER 5%, PR 5%, HER-2 negative, Ki-67 50%; T1cN0 stage IA clinical stage      01/18/2016 Surgery    Right lumpectomy: Multifocal IDC grade 3, 1.9 cm  ER 0%, PR 0%, HER-2 negative, Ki-67 70% and 0.8 cm satellite mass ER 5%, PR 2%, HER-2 negative, Ki-67 50%, high-grade DCIS, margins negative, 0/1 lymph nodes negative T1 cN0 stage IA      02/17/2016 -  Anti-estrogen oral therapy    Adjuvant chemotherapy with Taxotere and Cytoxan4       Review of Systems  Constitutional: Positive for fever and malaise/fatigue.  Cardiovascular: Positive for leg swelling.  Skin: Positive for rash.  All other systems reviewed and are negative.   Past Medical History:  Diagnosis Date  . Abnormally small mouth   . Anemia   . Bilateral carotid artery stenosis    Bilateral ICA 40-59%/  >50% LECA  . Breast cancer ([place])   . Breast cancer of upper-inner quadrant of right female breast ([place]) 12/16/2015  . Bruises easily   . CAD (coronary artery disease) cardiologist-  dr Aundra Dubin   Remote CABG in 1998 with redo in 2009  . CHF (congestive heart failure) (HCC)    EF is low normal at 50 to 55% per echo in Jan. 2011  . Dyspnea   . GERD (gastroesophageal reflux disease)   . Hearing loss of right ear   . History of non-ST elevation myocardial infarction (NSTEMI)    Sept 2014--  thought to be type II HTN w/ LHC without infarct related artery and patent grafts  . Hyperlipidemia   . Hypertension   . Iron deficiency anemia   . Moderate persistent asthma     pulmologist-  Dr. Malvin Johns  . Nausea & vomiting    related to stone  . Nonischemic cardiomyopathy ([place])   . PAF (paroxysmal atrial fibrillation) ([place])   . PONV (postoperative nausea and vomiting)    pt states patch worked well before  . Psoriasis    right leg  . Renal calculus, right   . Renal insufficiency, mild   . S/P AVR    prosthesis valve placement 2009 at same time re-do CABG  . 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 Type 2 diabetes mellitus ([place])    monitored by dr Dwyane Dee    Past Surgical History:  Procedure Laterality Date  . AORTIC VALVE REPLACEMENT  2009   [facility] is [facility]  . BREAST LUMPECTOMY WITH RADIOACTIVE SEED AND SENTINEL LYMPH NODE BIOPSY Right 01/18/2016   Procedure: RIGHT BREAST LUMPECTOMY WITH RADIOACTIVE SEED AND SENTINEL LYMPH NODE BIOPSY;  Surgeon: DAlphonsa Overall MD;  [place]  Service: General;  Laterality: Right;  . CARDIAC CATHETERIZATION  03/23/2008   Pre-redo CABG: L main OK, LAD (T), CFX (T), OM1 99%, RCA (T), LIMA-LAD OK, SVG-OM(?3) OK w/ little florw to OM2, SVG-RCA OK. EF NL  . CARDIOVASCULAR STRESS TEST  06-15-2013      dr hDebara Pickett  Lexiscan perfusion study/  no evidence ischemia/  normal wall motion, ef 72%  . CARPAL TUNNEL RELEASE    . COLONOSCOPY    . CORONARY ARTERY BYPASS GRAFT  1998 &  re-do 2009   Had LIMA to DX/LAD, SVG to 2 marginal branches and SVG to [facility] originally; SVG to 3rd OM and PD at time of redo  . CYSTOSCOPY W/ URETERAL STENT PLACEMENT Right 12/20/2014   Procedure: CYSTOSCOPY WITH RETROGRADE PYELOGRAM/URETERAL STENT PLACEMENT;  Surgeon: Cleon Gustin, MD;  Location: [facility];  Service: Urology;  Laterality: Right;  . EYE SURGERY Bilateral    cataracts  . HOLMIUM LASER APPLICATION Right 1/61/0960   Procedure:  HOLMIUM LASER LITHOTRIPSY;  Surgeon: Cleon Gustin, MD;  Location: [facility];  Service: Urology;  Laterality: Right;    . LEFT HEART CATHETERIZATION WITH CORONARY/GRAFT ANGIOGRAM N/A 02/23/2013   Procedure: LEFT HEART CATHETERIZATION WITH Beatrix Fetters;  Surgeon: Blane Ohara, MD;  Location: [facility] C[facility] LAB;  Service: Cardiovascular;  Laterality: N/A;  . PORTACATH PLACEMENT Left 01/18/2016   Procedure: INSERTION PORT-A-CATH;  Surgeon: Alphonsa Overall, MD;  Location: [place];  Service: General;  Laterality: Left;  . TONSILLECTOMY    . TRANSTHORACIC ECHOCARDIOGRAM  02-24-2013      mild LVH,  ef 50-55%/  AV bioprosthesis was present with very mild stenosis and no regurg., mean grandient 69mHg, peak grandient 232mg /  mild MR/  mild LAE and RAE/  moderate TR  . TUBAL LIGATION      has HYPERLIPIDEMIA; HYPERTENSION; Atrial fibrillation ([place]CD[place] Allergic rhinitis; Asthma; DYSPNEA; COUGH; Hypertension; Hyperlipidemia; SOB (shortness of breath); CHF (congestive heart failure) (HCMaunawili Renal insufficiency, mild; Dyspnea; Nonischemic cardiomyopathy (HCG[place] Anemia; Diabetes mellitus, type 2 (HCLeb[place] Iron deficiency anemia; S/P aortic valve replacement; CAD (coronary artery disease); Non-ST elevation myocardial infarction (NSTEMI), [facility] Atrial tachycardia (HC[facility]acks[place] Unstable angina (HCDec[place] Chest pain; Acute on chronic combined systolic and diastolic CHF, NYHA class 3 (HCHol[place] CKD (chronic kidney disease); Chronic diastolic CHF (congestive heart failure) (HC[place] Bl[place] Acute bronchitis; Breast cancer of upper-inner quadrant of right female breast (HCTrout[place] catheter in place; Chemotherapy induced nausea and vomiting; GERD (gastroesophageal reflux disease); Mania (HCCol[place] Cellulitis; and Chemotherapy-induced thrombocytopenia on her problem list.    is allergic to amoxicillin; imdur [isosorbide dinitrate]; latex; and tetracycline.    Medication List       Accurate as of 04/05/16 11:59 PM. Always use your most recent med list.          ACCU-CHEK AVIVA PLUS test strip Generic drug:  glucose blood   alendronate 70 MG  tablet Commonly known as:  FOSAMAX Take 70 mg by mouth once a week. Take with a full glass of water on an empty stomach.   aspirin 81 MG EC tablet Take 1 tablet (81 mg total) by mouth daily.   budesonide-formoterol 160-4.5 MCG/ACT inhaler Commonly known as:  SYMBICORT INHALE TWO PUFFS INTO LUNGS TWICE DAILY   bumetanide 1 MG tablet Commonly known as:  BUMEX Take 1 tablet (1 mg total) by mouth daily.   clopidogrel 75 MG tablet Commonly known as:  PLAVIX Take 75 mg by mouth daily.   gabapentin 300 MG capsule Commonly known as:  NEURONTIN Take 1 capsule (300 mg total) by mouth 3 (three) times daily.   HUMALOG 100 UNIT/ML injection Generic drug:  insulin lispro Inject 76 units daily with  V-GO pump   lidocaine-prilocaine cream Commonly known as:  EMLA Apply to affected area once  loratadine 10 MG tablet Commonly known as:  CLARITIN Take 10 mg by mouth daily.   LORazepam 2 MG tablet Commonly known as:  ATIVAN Take 0.5 tablets (1 mg total) by mouth at bedtime.   metoprolol succinate 100 MG 24 hr tablet Commonly known as:  TOPROL-XL TAKE ONE TABLET BY MOUTH TWICE DAILY TAKE  WITH  OR  IMMEDIATELY  FOLLOWING  A  MEAL   omeprazole 20 MG tablet Commonly known as:  PRILOSEC OTC Take 20 mg by mouth daily.   ondansetron 8 MG tablet Commonly known as:  ZOFRAN TAKE 1 TABLET BY MOUTH 2  TIMES DAILY AS NEEDED FOR  REFRACTORY NAUSEA /  VOMITING. START ON DAY 3  AFTER CHEMO.   prochlorperazine 10 MG tablet Commonly known as:  COMPAZINE Take 1 tablet (10 mg total) by mouth every 6 (six) hours as needed (Nausea or vomiting).   rosuvastatin 40 MG tablet Commonly known as:  CRESTOR TAKE ONE TABLET BY MOUTH ONCE DAILY   sucralfate 1 GM/10ML suspension Commonly known as:  CARAFATE Take 10 mLs (1 g total) by mouth 4 (four) times daily -  with meals and at bedtime.   TraMADol HCl 200 MG Cp24 Take 1 tablet by mouth daily as needed (for pain).   TRULICITY 6.38 VF/6.4PP  Sopn Generic drug:  Dulaglutide Inject 0.75 mg into the skin every Wednesday.   ZETIA 10 MG tablet Generic drug:  ezetimibe Take 1 tablet by mouth  daily        PHYSICAL EXAMINATION  Oncology Vitals 04/05/2016 04/04/2016  Height 165 cm 165 cm  Weight 96.525 kg 95.029 kg  Weight (lbs) 212 lbs 13 oz 209 lbs 8 oz  BMI (kg/m2) 35.41 kg/m2 34.86 kg/m2  Temp 99 98.3  Pulse 93 92  Resp 18 17  SpO2 97 99  BSA (m2) 2.1 m2 2.09 m2   BP Readings from Last 2 Encounters:  04/05/16 (!) 103/44  04/04/16 (!) 147/65    Physical Exam  Constitutional: She is oriented to person, place, and time. She appears unhealthy.  HENT:  Head: Normocephalic and atraumatic.  Mouth/Throat: Oropharynx is clear and moist.  Eyes: Conjunctivae and EOM are normal. Pupils are equal, round, and reactive to light. Right eye exhibits no discharge. Left eye exhibits no discharge. No scleral icterus.  Neck: Normal range of motion. Neck supple. No JVD present. No tracheal deviation present. No thyromegaly present.  Cardiovascular: Normal rate, regular rhythm, normal heart sounds and intact distal pulses.   Pulmonary/Chest: Effort normal and breath sounds normal. No respiratory distress. She has no wheezes. She has no rales. She exhibits no tenderness.  Abdominal: Soft. Bowel sounds are normal. She exhibits no distension and no mass. There is no tenderness. There is no rebound and no guarding.  Musculoskeletal: Normal range of motion. She exhibits edema and tenderness.  Lymphadenopathy:    She has no cervical adenopathy.  Neurological: She is alert and oriented to person, place, and time. Gait normal.  Skin: Skin is warm and dry. No rash noted. No erythema. No pallor.  Exam today reveals cellulitis to the left anterior lower leg.  Trace cellulitis is questionable to the right anterior lower leg as well.  Patient has some apparent bruising./Pulling of blood to the left instep of her foot.  There is warmth, redness, mild  tenderness, but no red streaks to the cellulitis.  Patient was afebrile while at the [place] today.   Psychiatric: Affect normal.  Nursing note and vitals reviewed.  LABORATORY DATA:. Appointment on 04/04/2016  Component Date Value Ref Range Status  . WBC 04/04/2016 13.6* 3.9 - 10.3 10e3/uL Final  . NEUT# 04/04/2016 12.5* 1.5 - 6.5 10e3/uL Final  . HGB 04/04/2016 9.6* 11.6 - 15.9 g/dL Final  . HCT 04/04/2016 30.2* 34.8 - 46.6 % Final  . Platelets 04/04/2016 63 Few Large & giant platelets* 145 - 400 10e3/uL Final  . MCV 04/04/2016 93.2  79.5 - 101.0 fL Final  . MCH 04/04/2016 29.5  25.1 - 34.0 pg Final  . MCHC 04/04/2016 31.7  31.5 - 36.0 g/dL Final  . RBC 04/04/2016 3.24* 3.70 - 5.45 10e6/uL Final  . RDW 04/04/2016 15.7* 11.2 - 14.5 % Final  . lymph# 04/04/2016 0.4* 0.9 - 3.3 10e3/uL Final  . MONO# 04/04/2016 0.2  0.1 - 0.9 10e3/uL Final  . Eosinophils Absolute 04/04/2016 0.4  0.0 - 0.5 10e3/uL Final  . Basophils Absolute 04/04/2016 0.1  0.0 - 0.1 10e3/uL Final  . NEUT% 04/04/2016 91.8* 38.4 - 76.8 % Final  . LYMPH% 04/04/2016 2.7* 14.0 - 49.7 % Final  . MONO% 04/04/2016 1.8  0.0 - 14.0 % Final  . EOS% 04/04/2016 2.9  0.0 - 7.0 % Final  . BASO% 04/04/2016 0.8  0.0 - 2.0 % Final  . Sodium 04/04/2016 140  136 - 145 mEq/L Final  . Potassium 04/04/2016 4.5  3.5 - 5.1 mEq/L Final  . Chloride 04/04/2016 103  98 - 109 mEq/L Final  . CO2 04/04/2016 30* 22 - 29 mEq/L Final  . Glucose 04/04/2016 138  70 - 140 mg/dl Final  . BUN 04/04/2016 32.1* 7.0 - 26.0 mg/dL Final  . Creatinine 04/04/2016 1.4* 0.6 - 1.1 mg/dL Final  . Total Bilirubin 04/04/2016 0.94  0.20 - 1.20 mg/dL Final  . Alkaline Phosphatase 04/04/2016 88  40 - 150 U/L Final  . AST 04/04/2016 25  5 - 34 U/L Final  . ALT 04/04/2016 17  0 - 55 U/L Final  . Total Protein 04/04/2016 5.8* 6.4 - 8.3 g/dL Final  . Albumin 04/04/2016 2.4* 3.5 - 5.0 g/dL Final  . Calcium 04/04/2016 9.0  8.4 - 10.4 mg/dL Final  . Anion Gap  04/04/2016 8  3 - 11 mEq/L Final  . EGFR 04/04/2016 37* >90 ml/min/1.73 m2 Final        RADIOGRAPHIC STUDIES: No results found.  ASSESSMENT/PLAN:    Cellulitis Patient presents to the [facility] today with erythema, warmth, tenderness, and mild edema to the left anterior lower leg.  She states she's also had a low-grade fever to a maximum of 100.4 overnight.  Of note-patient is a diabetic and also has a history of peripheral edema and takes Bumex as directed.  Exam today reveals cellulitis to the left anterior lower leg.  Trace cellulitis is questionable to the right anterior lower leg as well.  Patient has some apparent bruising./Pulling of blood to the left instep of her foot.  There is warmth, redness, mild tenderness, but no red streaks to the cellulitis.  Patient was afebrile while at the [place] today.  Patient will receive vancomycin 1250 mg IV while at the cancer Center today.  She will return tomorrow for further evaluation of her cellulitis and a second day of vancomycin IV.  Patient was advised to call/return or go directly to the emergency department for any worsening symptoms whatsoever. _________________________________________________  Update 04/05/16:  Patient presents back to the [facility] today for recheck of her left anterior lower leg cellulitis.  She also reports she continues with low-grade fevers at home.  On exam today-patient's left lower extremity cellulitis.  Area appears slightly with more eythema; and there is increased erythema to the right anterior lower leg as well.  Patient denies any increased pain or edema to either leg.  Patient will proceed with the second day of vancomycin IV as directed.  She'll return tomorrow for recheck.  Breast cancer of upper-inner quadrant of right female breast [facility]) Patient received cycle 3 of her Taxotere/cyclophosphamide chemotherapy on 03/30/2016.  Patient is scheduled to return on 04/20/2016 for labs, flush,  visit, and her next cycle of chemotherapy.   Patient stated understanding of all instructions; and was in agreement with this plan of care. The patient knows to call the clinic with any problems, questions or concerns.   Total time spent with patient was 15 minutes;  with greater than 75 percent of that time spent in face to face counseling regarding patient's symptoms,  and coordination of care and follow up.  Disclaimer:This dictation was prepared with Dragon/digital dictation along with [employer]. Any transcriptional errors that result from this process are unintentional.  Drue Second, NP 04/06/2016

## 2016-04-07 DIAGNOSIS — I481 Persistent atrial fibrillation: Secondary | ICD-10-CM

## 2016-04-07 LAB — COMPREHENSIVE METABOLIC PANEL
ALT: 22 U/L (ref 14–54)
AST: 35 U/L (ref 15–41)
Albumin: 2.3 g/dL — ABNORMAL LOW (ref 3.5–5.0)
Alkaline Phosphatase: 53 U/L (ref 38–126)
Anion gap: 6 (ref 5–15)
BUN: 30 mg/dL — ABNORMAL HIGH (ref 6–20)
CO2: 27 mmol/L (ref 22–32)
Calcium: 8.4 mg/dL — ABNORMAL LOW (ref 8.9–10.3)
Chloride: 105 mmol/L (ref 101–111)
Creatinine, Ser: 1.64 mg/dL — ABNORMAL HIGH (ref 0.44–1.00)
GFR calc Af Amer: 36 mL/min — ABNORMAL LOW (ref >60)
GFR calc non Af Amer: 31 mL/min — ABNORMAL LOW (ref >60)
Glucose, Bld: 238 mg/dL — ABNORMAL HIGH (ref 65–99)
Potassium: 4.1 mmol/L (ref 3.5–5.1)
Sodium: 138 mmol/L (ref 135–145)
Total Bilirubin: 1.3 mg/dL — ABNORMAL HIGH (ref 0.3–1.2)
Total Protein: 5 g/dL — ABNORMAL LOW (ref 6.5–8.1)

## 2016-04-07 LAB — GLUCOSE, CAPILLARY
Glucose-Capillary: 165 mg/dL — ABNORMAL HIGH (ref 65–99)
Glucose-Capillary: 232 mg/dL — ABNORMAL HIGH (ref 65–99)
Glucose-Capillary: 63 mg/dL — ABNORMAL LOW (ref 65–99)
Glucose-Capillary: 70 mg/dL (ref 65–99)
Glucose-Capillary: 171 mg/dL — ABNORMAL HIGH (ref 65–99)

## 2016-04-07 LAB — CBC
HCT: 25.4 % — ABNORMAL LOW (ref 36.0–46.0)
Hemoglobin: 7.9 g/dL — ABNORMAL LOW (ref 12.0–15.0)
MCH: 30.4 pg (ref 26.0–34.0)
MCHC: 31.1 g/dL (ref 30.0–36.0)
MCV: 97.7 fL (ref 78.0–100.0)
Platelets: 145 10*3/uL — ABNORMAL LOW (ref 150–400)
RBC: 2.6 MIL/uL — ABNORMAL LOW (ref 3.87–5.11)
RDW: 15.6 % — ABNORMAL HIGH (ref 11.5–15.5)
WBC: 17.6 10*3/uL — ABNORMAL HIGH (ref 4.0–10.5)

## 2016-04-07 LAB — VANCOMYCIN, TROUGH: Vancomycin Tr: 16 ug/mL (ref 15–20)

## 2016-04-07 LAB — LACTIC ACID, PLASMA: Lactic Acid, Venous: 1.9 mmol/L (ref 0.5–1.9)

## 2016-04-07 MED ORDER — DILTIAZEM HCL 25 MG/5ML IV SOLN
15.0000 mg | Freq: Once | INTRAVENOUS | Status: AC
  Administered 2016-04-07: 15 mg via INTRAVENOUS
  Filled 2016-04-07: qty 5

## 2016-04-07 NOTE — Progress Notes (Signed)
NP on call notified of patient's varying rhythm on tele. Pt has been in Afib, junctional tach, brady in the 40's and a sinus rhythm.  Pt asymptomatic. New order received to given IV Cardizem via IV push.  Will continue to monitor patient.

## 2016-04-07 NOTE — Progress Notes (Signed)
CBG 63. Pt given orange juice. Rechecked and CBG was 70.  K. Hardin Negus RN

## 2016-04-07 NOTE — Progress Notes (Signed)
PROGRESS NOTE    GAEL DELUDE  FUX:[id number] DOB: Oct 11, 1945 DOA: 04/06/2016 PCP: Nicola Girt, DO    Brief Narrative: Hannah [name] is a 70 y.o. female with history with history of anemia, CHF, coronary artery disease, hypertension, breast cancer currently undergoing chemotherapy, presents to emergency department with complaint of left leg pain, redness, swelling.  Assessment & Plan:   Principal Problem:   Cellulitis of left leg Active Problems:   Atrial fibrillation (HCC)   Hypertension   CHF (congestive heart failure) (HCC)   Nonischemic cardiomyopathy (HCC)   Diabetes mellitus, type 2 (HCC)   S/P aortic valve replacement   CAD (coronary artery disease)   Atrial tachycardia (HCC)   Unstable angina (HCC)   CKD (chronic kidney disease)   Chronic diastolic CHF (congestive heart failure) (HCC)   Breast cancer of upper-inner quadrant of right female breast (HCC)   Chemotherapy induced nausea and vomiting   GERD (gastroesophageal reflux disease)   Chemotherapy-induced thrombocytopenia   Osteomyelitis of toe of left foot (HCC)   Bilateral lower extremity cellulitis: Started on IV vancomycin and cefepime,  Elevate the legs. US duplex negative for DVT.  Repeat lactic acid ordered and pending.  Wound care.    IDDM: CBG (last 3)   Recent Labs  04/07/16 1139 04/07/16 1634 04/07/16 1654  GLUCAP 232* 63* 70    Resume SSI, stopped the premeal coverage for low cbgs.  Monitor closely.  No long acting.    Chronic atrial fibrillation: Rate controlled.  D/c telem   CAD: No chest pain.   NICM; Chronic diastolic CHF: Appears to be compensated.     DVT prophylaxis: heparin.  Code Status: (Full/) Family Communication: husband at bedside, discussed the plan of care.  Disposition Plan:  Pending resolution of the cellulitis.    Consultants:   None.    Procedures: none.    Antimicrobials: vancomycin   Cefepime    Subjective: Reports she is  feeling better than yesterday. Her legs are still swollen.   Objective: Vitals:   04/06/16 1808 04/06/16 2047 04/07/16 0533 04/07/16 1530  BP: (!) 111/41 (!) 108/49 (!) 103/58 (!) 118/51  Pulse: 95 89 90 (!) 129  Resp: 20 18 (!) 24 (!) 21  Temp: 99.5 F (37.5 C) 99.3 F (37.4 C) 98.5 F (36.9 C) 98.4 F (36.9 C)  TempSrc: Oral Oral Oral Oral  SpO2: 99% 99% 96% 100%  Weight: 97.1 kg (214 lb)     Height: 5\' 5"  (1.651 m)       Intake/Output Summary (Last 24 hours) at 04/07/16 1748 Last data filed at 04/07/16 1611  Gross per 24 hour  Intake              960 ml  Output             1450 ml  Net             -490 ml   Filed Weights   04/06/16 1314 04/06/16 1808  Weight: 96.2 kg (212 lb) 97.1 kg (214 lb)    Examination:  General exam: Appears calm and comfortable  Respiratory system: Clear to auscultation. Respiratory effort normal. Cardiovascular system: S1 & S2 heard, RRR. No JVD, murmurs, rubs, gallops or clicks Gastrointestinal system: Abdomen is nondistended, soft and nontender. No organomegaly or masses felt. Normal bowel sounds heard. Central nervous system: Alert and oriented. No focal neurological deficits. Extremities: bilateral lower extremities erythema and swelling with tenderness on touch.  Skin: No rashes, lesions  or ulcers Psychiatry: Judgement and insight appear normal. Mood & affect appropriate.     Data Reviewed: I have personally reviewed following labs and imaging studies  CBC:  Recent Labs Lab 04/04/16 0850 04/06/16 1400 04/07/16 0920  WBC 13.6* 6.3 17.6*  NEUTROABS 12.5* 3.5  --   HGB 9.6* 8.5* 7.9*  HCT 30.2* 27.0* 25.4*  MCV 93.2 96.1 97.7  PLT 63 Few Large & giant platelets* 122* 355*   Basic Metabolic Panel:  Recent Labs Lab 04/04/16 0850 04/06/16 1400 04/07/16 0920  NA 140 140 138  K 4.5 3.7 4.1  CL  --  105 105  CO2 30* 29 27  GLUCOSE 138 106* 238*  BUN 32.1* 29* 30*  CREATININE 1.4* 1.54* 1.64*  CALCIUM 9.0 8.5* 8.4*    GFR: Estimated Creatinine Clearance: 36.8 mL/min (by C-G formula based on SCr of 1.64 mg/dL (H)). Liver Function Tests:  Recent Labs Lab 04/04/16 0850 04/06/16 1400 04/07/16 0920  AST 25 41 35  ALT 17 25 22   ALKPHOS 88 65 53  BILITOT 0.94 0.8 1.3*  PROT 5.8* 5.7* 5.0*  ALBUMIN 2.4* 2.6* 2.3*   No results for input(s): LIPASE, AMYLASE in the last 168 hours. No results for input(s): AMMONIA in the last 168 hours. Coagulation Profile: No results for input(s): INR, PROTIME in the last 168 hours. Cardiac Enzymes: No results for input(s): CKTOTAL, CKMB, CKMBINDEX, TROPONINI in the last 168 hours. BNP (last 3 results) No results for input(s): PROBNP in the last 8760 hours. HbA1C: No results for input(s): HGBA1C in the last 72 hours. CBG:  Recent Labs Lab 04/06/16 2329 04/07/16 0813 04/07/16 1139 04/07/16 1634 04/07/16 1654  GLUCAP 73 165* 232* 63* 70   Lipid Profile: No results for input(s): CHOL, HDL, LDLCALC, TRIG, CHOLHDL, LDLDIRECT in the last 72 hours. Thyroid Function Tests: No results for input(s): TSH, T4TOTAL, FREET4, T3FREE, THYROIDAB in the last 72 hours. Anemia Panel: No results for input(s): VITAMINB12, FOLATE, FERRITIN, TIBC, IRON, RETICCTPCT in the last 72 hours. Sepsis Labs:  Recent Labs Lab 04/06/16 1410 04/06/16 1638  LATICACIDVEN 1.79 2.09*    Recent Results (from the past 240 hour(s))  Blood culture (routine x 2)     Status: None (Preliminary result)   Collection Time: 04/06/16  1:45 PM  Result Value Ref Range Status   Specimen Description BLOOD PORTA CATH  Final   Special Requests BOTTLES DRAWN AEROBIC AND ANAEROBIC 5CC  Final   Culture   Final    NO GROWTH < 24 HOURS Performed at [facility]    Report Status PENDING  Incomplete  Blood culture (routine x 2)     Status: None (Preliminary result)   Collection Time: 04/06/16  2:34 PM  Result Value Ref Range Status   Specimen Description BLOOD LEFT HAND  Final   Special Requests  BOTTLES DRAWN AEROBIC AND ANAEROBIC 5CC  Final   Culture   Final    NO GROWTH < 24 HOURS Performed at [facility]    Report Status PENDING  Incomplete         Radiology Studies: No results found.      Scheduled Meds: . acetaminophen  650 mg Oral Q6H   Or  . acetaminophen  650 mg Rectal Q6H  . aspirin  81 mg Oral Daily  . bumetanide  1 mg Oral Daily  . ceFEPime (MAXIPIME) IV  2 g Intravenous Q24H  . clopidogrel  75 mg Oral Daily  . docusate sodium  100 mg Oral BID  . gabapentin  300 mg Oral TID  . heparin  5,000 Units Subcutaneous Q8H  . insulin aspart  0-20 Units Subcutaneous TID WC  . insulin aspart  0-5 Units Subcutaneous QHS  . loratadine  10 mg Oral Daily  . LORazepam  1 mg Oral QHS  . metoprolol succinate  100 mg Oral Daily  . mometasone-formoterol  2 puff Inhalation BID  . rosuvastatin  40 mg Oral Daily  . sodium chloride flush  3 mL Intravenous Q12H  . vancomycin  1,250 mg Intravenous Q24H   Continuous Infusions:    LOS: 1 day    Time spent: 35 minutes.     Hosie Poisson, MD [facility] Pager [phone]  If 7PM-7AM, please contact night-coverage [email or web address] Password [facility] 04/07/2016, 5:48 PM

## 2016-04-07 NOTE — Progress Notes (Signed)
Pharmacy Antibiotic Follow-up Note  ALOHA BARTOK is a 70 y.o. year-old female admitted on 04/06/2016.  The patient is currently on day 3 of Vancomycin (prior doses at [facility]) & day 1 of Cefepime for worsening cellulitis in patient undergoing chemotherapy.  Assessment/Plan:  Continue Vancomycin 1250 mg IV q24 and Cefepime 2g IV q24 as ordered. Given immunocompromised state and slow response on vancomycin, will allow slightly higher trough for cellulitis.   Temp (24hrs), Avg:98.9 F (37.2 C), Min:98.4 F (36.9 C), Max:99.5 F (37.5 C)   Recent Labs Lab 04/04/16 0850 04/06/16 1400 04/07/16 0920  WBC 13.6* 6.3 17.6*     Recent Labs Lab 04/04/16 0850 04/06/16 1400 04/07/16 0920  CREATININE 1.4* 1.54* 1.64*   Estimated Creatinine Clearance: 36.8 mL/min (by C-G formula based on SCr of 1.64 mg/dL (H)).    Allergies  Allergen Reactions  . Amoxicillin Rash    Has patient had a PCN reaction causing immediate rash, facial/tongue/throat swelling, SOB or lightheadedness with hypotension: No Has patient had a PCN reaction causing severe rash involving mucus membranes or skin necrosis: Yes Has patient had a PCN reaction that required hospitalization No Has patient had a PCN reaction occurring within the last 10 years: No If all of the above answers are "NO", then may proceed with Cephalosporin use.   . Imdur [Isosorbide Dinitrate] Other (See Comments)    Headache/severe hypotension/Syncope  . Latex Itching  . Tetracycline Rash   Antimicrobials this admission: 10/25 Vanc >>  10/27 Cefepime >>   Levels/dose changes this admission: 10/28 1500 VT = 16 on 1250 q24  Microbiology results: 10/27 BCx: ng1d  Thank you for allowing pharmacy to be a part of this patient's care.  Reuel Boom, PharmD, BCPS Pager: [phone] 04/07/2016, 4:18 PM

## 2016-04-08 ENCOUNTER — Inpatient Hospital Stay (HOSPITAL_COMMUNITY): Payer: Medicare Other

## 2016-04-08 DIAGNOSIS — R000 Tachycardia, unspecified: Secondary | ICD-10-CM

## 2016-04-08 LAB — GLUCOSE, CAPILLARY
Glucose-Capillary: 138 mg/dL — ABNORMAL HIGH (ref 65–99)
Glucose-Capillary: 157 mg/dL — ABNORMAL HIGH (ref 65–99)
Glucose-Capillary: 249 mg/dL — ABNORMAL HIGH (ref 65–99)
Glucose-Capillary: 167 mg/dL — ABNORMAL HIGH (ref 65–99)
Glucose-Capillary: 189 mg/dL — ABNORMAL HIGH (ref 65–99)

## 2016-04-08 LAB — DIFFERENTIAL
Basophils Absolute: 0 10*3/uL (ref 0.0–0.1)
Basophils Relative: 0 %
Eosinophils Absolute: 0 10*3/uL (ref 0.0–0.7)
Eosinophils Relative: 0 %
Lymphocytes Relative: 2 %
Lymphs Abs: 0.8 10*3/uL (ref 0.7–4.0)
Monocytes Absolute: 1.7 10*3/uL — ABNORMAL HIGH (ref 0.1–1.0)
Monocytes Relative: 4 %
Neutro Abs: 39.4 10*3/uL — ABNORMAL HIGH (ref 1.7–7.7)
Neutrophils Relative %: 94 %

## 2016-04-08 LAB — BASIC METABOLIC PANEL
Anion gap: 7 (ref 5–15)
BUN: 28 mg/dL — ABNORMAL HIGH (ref 6–20)
CO2: 28 mmol/L (ref 22–32)
Calcium: 8.6 mg/dL — ABNORMAL LOW (ref 8.9–10.3)
Chloride: 106 mmol/L (ref 101–111)
Creatinine, Ser: 1.71 mg/dL — ABNORMAL HIGH (ref 0.44–1.00)
GFR calc Af Amer: 34 mL/min — ABNORMAL LOW (ref >60)
GFR calc non Af Amer: 29 mL/min — ABNORMAL LOW (ref >60)
Glucose, Bld: 158 mg/dL — ABNORMAL HIGH (ref 65–99)
Potassium: 4 mmol/L (ref 3.5–5.1)
Sodium: 141 mmol/L (ref 135–145)

## 2016-04-08 LAB — D-DIMER, QUANTITATIVE: D-Dimer, Quant: 1.86 ug/mL-FEU — ABNORMAL HIGH (ref 0.00–0.50)

## 2016-04-08 LAB — CBC
HCT: 26.9 % — ABNORMAL LOW (ref 36.0–46.0)
Hemoglobin: 8.4 g/dL — ABNORMAL LOW (ref 12.0–15.0)
MCH: 30.4 pg (ref 26.0–34.0)
MCHC: 31.2 g/dL (ref 30.0–36.0)
MCV: 97.5 fL (ref 78.0–100.0)
Platelets: 202 10*3/uL (ref 150–400)
RBC: 2.76 MIL/uL — ABNORMAL LOW (ref 3.87–5.11)
RDW: 15.8 % — ABNORMAL HIGH (ref 11.5–15.5)
WBC: 41.9 10*3/uL — ABNORMAL HIGH (ref 4.0–10.5)

## 2016-04-08 LAB — ECHOCARDIOGRAM COMPLETE
Height: 65 in
Weight: 3424 oz

## 2016-04-08 LAB — TROPONIN I
Troponin I: 0.32 ng/mL (ref <0.03)
Troponin I: 0.38 ng/mL (ref <0.03)
Troponin I: 0.45 ng/mL (ref <0.03)

## 2016-04-08 LAB — TSH: TSH: 3.355 u[IU]/mL (ref 0.350–4.500)

## 2016-04-08 MED ORDER — DILTIAZEM HCL 30 MG PO TABS
30.0000 mg | ORAL_TABLET | Freq: Two times a day (BID) | ORAL | Status: DC
  Administered 2016-04-08 – 2016-04-09 (×2): 30 mg via ORAL
  Filled 2016-04-08 (×2): qty 1

## 2016-04-08 MED ORDER — DILTIAZEM HCL 25 MG/5ML IV SOLN
10.0000 mg | Freq: Once | INTRAVENOUS | Status: AC
  Administered 2016-04-08: 10 mg via INTRAVENOUS
  Filled 2016-04-08: qty 5

## 2016-04-08 MED ORDER — SODIUM CHLORIDE 0.9 % IV BOLUS (SEPSIS)
500.0000 mL | Freq: Once | INTRAVENOUS | Status: AC
  Administered 2016-04-08: 05:00:00 via INTRAVENOUS

## 2016-04-08 NOTE — Progress Notes (Signed)
Pt BP 98/52 and HR 137. On call Georges Mouse notified @ [phone]. Dr. Hulen Skains and ordered 12 lead EKG.

## 2016-04-08 NOTE — Progress Notes (Signed)
*  PRELIMINARY RESULTS* Echocardiogram 2D Echocardiogram has been performed.  Leavy Cella 04/08/2016, 2:36 PM

## 2016-04-08 NOTE — Progress Notes (Signed)
Troponin increased to 0.45. MD made aware. Will enter orders and contact Cardiology in AM.  Callie Fielding RN

## 2016-04-08 NOTE — Progress Notes (Signed)
PROGRESS NOTE    Hannah [name]  DJM:[id number] DOB: 1946-05-29 DOA: 04/06/2016 PCP: Nicola Girt, DO    Brief Narrative: Hannah [name] is a 70 y.o. female with history with history of anemia, CHF, coronary artery disease, hypertension, breast cancer currently undergoing chemotherapy, presents to emergency department with complaint of left leg pain, redness, swelling.  Assessment & Plan:   Principal Problem:   Cellulitis of left leg Active Problems:   Atrial fibrillation (HCC)   Hypertension   CHF (congestive heart failure) (HCC)   Nonischemic cardiomyopathy (HCC)   Diabetes mellitus, type 2 (HCC)   S/P aortic valve replacement   CAD (coronary artery disease)   Atrial tachycardia (HCC)   Unstable angina (HCC)   CKD (chronic kidney disease)   Chronic diastolic CHF (congestive heart failure) (HCC)   Breast cancer of upper-inner quadrant of right female breast (HCC)   Chemotherapy induced nausea and vomiting   GERD (gastroesophageal reflux disease)   Chemotherapy-induced thrombocytopenia   Osteomyelitis of toe of left foot (HCC)   Bilateral lower extremity cellulitis: Started on IV vancomycin and cefepime,  Elevate the legs. US duplex negative for DVT.  Repeat lactic acid ordered and normalized.  Wound care.  Improving erythema and tenderness.     IDDM: CBG (last 3)   Recent Labs  04/08/16 0734 04/08/16 1212 04/08/16 1705  GLUCAP 157* 249* 167*    Resume SSI, stopped the premeal coverage for low cbgs.  Monitor closely.  No long acting.    Chronic atrial fibrillation: Over night rate in 130's , one dose of cardizem given, suspect probably from infection,  Oral cardizem added today.  bp parameters improved.  Echo ordered.  Slightly elevated troponins, suspect from demand ischemia. She currently denies any chest pain or sob.    CAD: No chest pain.   NICM; Chronic diastolic CHF: Appears to be compensated.     DVT prophylaxis: heparin.  Code  Status: (Full/) Family Communication: husband at bedside, discussed the plan of care.  Disposition Plan:  Pending resolution of the cellulitis.    Consultants:   None.    Procedures: none.    Antimicrobials: vancomycin   Cefepime    Subjective: Reports she is feeling better than yesterday. Her legs are still swollen.   Objective: Vitals:   04/08/16 1300 04/08/16 1640 04/08/16 1650 04/08/16 1652  BP: 105/62 (!) 107/58 (!) 94/53 (!) 101/48  Pulse: (!) 117 (!) 132 (!) 118 (!) 116  Resp: 20     Temp: 98.6 F (37 C)     TempSrc: Oral     SpO2: 96%     Weight:      Height:        Intake/Output Summary (Last 24 hours) at 04/08/16 1756 Last data filed at 04/08/16 0631  Gross per 24 hour  Intake               50 ml  Output             1400 ml  Net            -1350 ml   Filed Weights   04/06/16 1314 04/06/16 1808  Weight: 96.2 kg (212 lb) 97.1 kg (214 lb)    Examination:  General exam: Appears calm and comfortable  Respiratory system: Clear to auscultation. Respiratory effort normal. Cardiovascular system: S1 & S2 heard, RRR. No JVD, murmurs, rubs, gallops or clicks Gastrointestinal system: Abdomen is nondistended, soft and nontender. No organomegaly or masses felt.  Normal bowel sounds heard. Central nervous system: Alert and oriented. No focal neurological deficits. Extremities: bilateral lower extremities erythema and swelling with tenderness on touch.  Skin: No rashes, lesions or ulcers Psychiatry: Judgement and insight appear normal. Mood & affect appropriate.     Data Reviewed: I have personally reviewed following labs and imaging studies  CBC:  Recent Labs Lab 04/04/16 0850 04/06/16 1400 04/07/16 0920 04/08/16 0500  WBC 13.6* 6.3 17.6* 41.9*  NEUTROABS 12.5* 3.5  --  39.4*  HGB 9.6* 8.5* 7.9* 8.4*  HCT 30.2* 27.0* 25.4* 26.9*  MCV 93.2 96.1 97.7 97.5  PLT 63 Few Large & giant platelets* 122* 145* 734   Basic Metabolic Panel:  Recent Labs Lab  04/04/16 0850 04/06/16 1400 04/07/16 0920 04/08/16 0500  NA 140 140 138 141  K 4.5 3.7 4.1 4.0  CL  --  105 105 106  CO2 30* 29 27 28   GLUCOSE 138 106* 238* 158*  BUN 32.1* 29* 30* 28*  CREATININE 1.4* 1.54* 1.64* 1.71*  CALCIUM 9.0 8.5* 8.4* 8.6*   GFR: Estimated Creatinine Clearance: 35.3 mL/min (by C-G formula based on SCr of 1.71 mg/dL (H)). Liver Function Tests:  Recent Labs Lab 04/04/16 0850 04/06/16 1400 04/07/16 0920  AST 25 41 35  ALT 17 25 22   ALKPHOS 88 65 53  BILITOT 0.94 0.8 1.3*  PROT 5.8* 5.7* 5.0*  ALBUMIN 2.4* 2.6* 2.3*   No results for input(s): LIPASE, AMYLASE in the last 168 hours. No results for input(s): AMMONIA in the last 168 hours. Coagulation Profile: No results for input(s): INR, PROTIME in the last 168 hours. Cardiac Enzymes:  Recent Labs Lab 04/08/16 0500 04/08/16 1055  TROPONINI 0.32* 0.38*   BNP (last 3 results) No results for input(s): PROBNP in the last 8760 hours. HbA1C: No results for input(s): HGBA1C in the last 72 hours. CBG:  Recent Labs Lab 04/07/16 2035 04/08/16 0244 04/08/16 0734 04/08/16 1212 04/08/16 1705  GLUCAP 171* 138* 157* 249* 167*   Lipid Profile: No results for input(s): CHOL, HDL, LDLCALC, TRIG, CHOLHDL, LDLDIRECT in the last 72 hours. Thyroid Function Tests:  Recent Labs  04/08/16 0656  TSH 3.355   Anemia Panel: No results for input(s): VITAMINB12, FOLATE, FERRITIN, TIBC, IRON, RETICCTPCT in the last 72 hours. Sepsis Labs:  Recent Labs Lab 04/06/16 1410 04/06/16 1638 04/07/16 1918  LATICACIDVEN 1.79 2.09* 1.9    Recent Results (from the past 240 hour(s))  Blood culture (routine x 2)     Status: None (Preliminary result)   Collection Time: 04/06/16  1:45 PM  Result Value Ref Range Status   Specimen Description BLOOD PORTA CATH  Final   Special Requests BOTTLES DRAWN AEROBIC AND ANAEROBIC 5CC  Final   Culture   Final    NO GROWTH 2 DAYS Performed at [facility]    Report  Status PENDING  Incomplete  Blood culture (routine x 2)     Status: None (Preliminary result)   Collection Time: 04/06/16  2:34 PM  Result Value Ref Range Status   Specimen Description BLOOD LEFT HAND  Final   Special Requests BOTTLES DRAWN AEROBIC AND ANAEROBIC 5CC  Final   Culture   Final    NO GROWTH 2 DAYS Performed at [facility]    Report Status PENDING  Incomplete         Radiology Studies: No results found.      Scheduled Meds: . acetaminophen  650 mg Oral Q6H   Or  .  acetaminophen  650 mg Rectal Q6H  . aspirin  81 mg Oral Daily  . bumetanide  1 mg Oral Daily  . ceFEPime (MAXIPIME) IV  2 g Intravenous Q24H  . clopidogrel  75 mg Oral Daily  . diltiazem  30 mg Oral Q12H  . docusate sodium  100 mg Oral BID  . gabapentin  300 mg Oral TID  . heparin  5,000 Units Subcutaneous Q8H  . insulin aspart  0-20 Units Subcutaneous TID WC  . insulin aspart  0-5 Units Subcutaneous QHS  . loratadine  10 mg Oral Daily  . LORazepam  1 mg Oral QHS  . metoprolol succinate  100 mg Oral Daily  . mometasone-formoterol  2 puff Inhalation BID  . rosuvastatin  40 mg Oral Daily  . vancomycin  1,250 mg Intravenous Q24H   Continuous Infusions:    LOS: 2 days    Time spent: 35 minutes.     Hosie Poisson, MD [facility] Pager [phone]  If 7PM-7AM, please contact night-coverage [email or web address] Password [email or web address] 04/08/2016, 5:56 PM

## 2016-04-08 NOTE — Progress Notes (Signed)
WBC increased from 17.6 to 41.9. D dimer 1.86. Troponin 0.32. MD paged to make aware. Return phone call and will come to see patient.   Callie Fielding RN

## 2016-04-08 NOTE — Progress Notes (Signed)
HR sustained in 130's for just over 10 minutes. MD made aware. Orders for IV Cardizem x1 and then oral. Will continue to monitor.   Callie Fielding RN

## 2016-04-09 ENCOUNTER — Inpatient Hospital Stay (HOSPITAL_COMMUNITY): Payer: Medicare Other

## 2016-04-09 DIAGNOSIS — I471 Supraventricular tachycardia: Secondary | ICD-10-CM

## 2016-04-09 DIAGNOSIS — I480 Paroxysmal atrial fibrillation: Secondary | ICD-10-CM

## 2016-04-09 DIAGNOSIS — D72829 Elevated white blood cell count, unspecified: Secondary | ICD-10-CM

## 2016-04-09 DIAGNOSIS — E1122 Type 2 diabetes mellitus with diabetic chronic kidney disease: Secondary | ICD-10-CM

## 2016-04-09 DIAGNOSIS — L03116 Cellulitis of left lower limb: Principal | ICD-10-CM

## 2016-04-09 DIAGNOSIS — C50211 Malignant neoplasm of upper-inner quadrant of right female breast: Secondary | ICD-10-CM

## 2016-04-09 DIAGNOSIS — E1142 Type 2 diabetes mellitus with diabetic polyneuropathy: Secondary | ICD-10-CM

## 2016-04-09 LAB — BASIC METABOLIC PANEL
Anion gap: 7 (ref 5–15)
BUN: 26 mg/dL — ABNORMAL HIGH (ref 6–20)
CO2: 28 mmol/L (ref 22–32)
Calcium: 8.5 mg/dL — ABNORMAL LOW (ref 8.9–10.3)
Chloride: 105 mmol/L (ref 101–111)
Creatinine, Ser: 1.7 mg/dL — ABNORMAL HIGH (ref 0.44–1.00)
GFR calc Af Amer: 34 mL/min — ABNORMAL LOW (ref >60)
GFR calc non Af Amer: 29 mL/min — ABNORMAL LOW (ref >60)
Glucose, Bld: 176 mg/dL — ABNORMAL HIGH (ref 65–99)
Potassium: 3.6 mmol/L (ref 3.5–5.1)
Sodium: 140 mmol/L (ref 135–145)

## 2016-04-09 LAB — CBC
HCT: 26.4 % — ABNORMAL LOW (ref 36.0–46.0)
Hemoglobin: 8.1 g/dL — ABNORMAL LOW (ref 12.0–15.0)
MCH: 30.3 pg (ref 26.0–34.0)
MCHC: 30.7 g/dL (ref 30.0–36.0)
MCV: 98.9 fL (ref 78.0–100.0)
Platelets: 209 10*3/uL (ref 150–400)
RBC: 2.67 MIL/uL — ABNORMAL LOW (ref 3.87–5.11)
RDW: 16.2 % — ABNORMAL HIGH (ref 11.5–15.5)
WBC: 54.9 10*3/uL (ref 4.0–10.5)

## 2016-04-09 LAB — GLUCOSE, CAPILLARY
Glucose-Capillary: 178 mg/dL — ABNORMAL HIGH (ref 65–99)
Glucose-Capillary: 258 mg/dL — ABNORMAL HIGH (ref 65–99)
Glucose-Capillary: 172 mg/dL — ABNORMAL HIGH (ref 65–99)
Glucose-Capillary: 155 mg/dL — ABNORMAL HIGH (ref 65–99)

## 2016-04-09 MED ORDER — TECHNETIUM TO 99M ALBUMIN AGGREGATED
4.3000 | Freq: Once | INTRAVENOUS | Status: AC | PRN
  Administered 2016-04-09: 4.3 via INTRAVENOUS

## 2016-04-09 MED ORDER — METOPROLOL SUCCINATE ER 50 MG PO TB24
50.0000 mg | ORAL_TABLET | Freq: Every evening | ORAL | Status: DC
  Administered 2016-04-09: 50 mg via ORAL
  Filled 2016-04-09: qty 1

## 2016-04-09 MED ORDER — TECHNETIUM TC 99M DIETHYLENETRIAME-PENTAACETIC ACID: 30.2000 | Freq: Once | INTRAVENOUS | Status: DC | PRN

## 2016-04-09 NOTE — Progress Notes (Signed)
HEMATOLOGY-ONCOLOGY PROGRESS NOTE  SUBJECTIVE: Bilateral lower extremity cellulitis, currently on chemotherapy for breast cancer. 70 year old patient of mine with right breast cancer who is currently on adjuvant chemotherapy with Taxotere and Cytoxan. She received third cycle of chemotherapy and noticed a redness of her lower extremity. She was seen by our symptom management clinic subsequently on Friday she was admitted to the hospital for bilateral lower extremity cellulitis. She is currently on antibiotics with vancomycin and it appears cellulitis is improving. She has not had any further fevers.    Breast cancer of upper-inner quadrant of right female breast ([place])   12/14/2015 Initial Diagnosis    Right breast biopsy 12:30 position: 2 masses, 1.6 cm mass: Invasive ductal carcinoma, grade 2, ER 0%, PR 0%, HER-2 negative, Ki-67 70%; satellite mass 8 mm: IDC grade 2 ER 5%, PR 5%, HER-2 negative, Ki-67 50%; T1cN0 stage IA clinical stage      01/18/2016 Surgery    Right lumpectomy: Multifocal IDC grade 3, 1.9 cm  ER 0%, PR 0%, HER-2 negative, Ki-67 70% and 0.8 cm satellite mass ER 5%, PR 2%, HER-2 negative, Ki-67 50%, high-grade DCIS, margins negative, 0/1 lymph nodes negative T1 cN0 stage IA      02/17/2016 -  Anti-estrogen oral therapy    Adjuvant chemotherapy with Taxotere and Cytoxan4       OBJECTIVE: REVIEW OF SYSTEMS:   Constitutional: Denies fevers, chills or abnormal weight loss Eyes: Denies blurriness of vision Ears, nose, mouth, throat, and face: Denies mucositis or sore throat Respiratory: Denies cough, dyspnea or wheezes Cardiovascular: Denies palpitation, chest discomfort Gastrointestinal:  Denies nausea, heartburn or change in bowel habits Skin: Denies abnormal skin rashes Lymphatics: Denies new lymphadenopathy or easy bruising Neurological:Denies numbness, tingling or new weaknesses Behavioral/Psych: Mood is stable, no new changes  Extremities: Bilateral lower extremity  cellulitis left greater than right All other systems were reviewed with the patient and are negative.  I have reviewed the past medical history, past surgical history, social history and family history with the patient and they are unchanged from previous note.   PHYSICAL EXAMINATION: ECOG PERFORMANCE STATUS: 2 - Symptomatic, <50% confined to bed  Vitals:   04/09/16 1300 04/09/16 1724  BP: (!) 115/55 117/64  Pulse: 79 82  Resp: 18   Temp: 98.7 F (37.1 C)    Filed Weights   04/06/16 1314 04/06/16 1808  Weight: 212 lb (96.2 kg) 214 lb (97.1 kg)    GENERAL:alert, no distress and comfortable SKIN: skin color, texture, turgor are normal, no rashes or significant lesions EYES: normal, Conjunctiva are pink and non-injected, sclera clear OROPHARYNX:no exudate, no erythema and lips, buccal mucosa, and tongue normal  NECK: supple, thyroid normal size, non-tender, without nodularity LYMPH:  no palpable lymphadenopathy in the cervical, axillary or inguinal LUNGS: clear to auscultation and percussion with normal breathing effort HEART: regular rate & rhythm and no murmurs and no lower extremity edema ABDOMEN:abdomen soft, non-tender and normal bowel sounds Musculoskeletal:Bilateral lower extremity cellulitis  NEURO: alert & oriented x 3 with fluent speech peripheral neuropathy  LABORATORY DATA:  I have reviewed the data as listed CMP Latest Ref Rng & Units 04/09/2016 04/08/2016 04/07/2016  Glucose 65 - 99 mg/dL 176(H) 158(H) 238(H)  BUN 6 - 20 mg/dL 26(H) 28(H) 30(H)  Creatinine 0.44 - 1.00 mg/dL 1.70(H) 1.71(H) 1.64(H)  Sodium 135 - 145 mmol/L 140 141 138  Potassium 3.5 - 5.1 mmol/L 3.6 4.0 4.1  Chloride 101 - 111 mmol/L 105 106 105  CO2 22 -  32 mmol/L _0 Calcium 8.9 - 10.3 mg/dL 8.5(L) 8.6(L) 8.4(L)  Total Protein 6.5 - 8.1 g/dL - - 5.0(L)  Total Bilirubin 0.3 - 1.2 mg/dL - - 1.3(H)  Alkaline Phos 38 - 126 U/L - - 53  AST 15 - 41 U/L - - 35  ALT 14 - 54 U/L - - 22     Lab Results  Component Value Date   WBC 54.9 (HH) 04/09/2016   HGB 8.1 (L) 04/09/2016   HCT 26.4 (L) 04/09/2016   MCV 98.9 04/09/2016   PLT 209 04/09/2016   NEUTROABS 39.4 (H) 04/08/2016    ASSESSMENT AND PLAN: 1. Lateral lower extremity cellulitis: Currently on vancomycin she appears to be improving. 2. marked leukocytosis related to Neulasta. I am not concerned very much about the elevation of white blood cell count. 3. 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 Breast cancer status post 3 cycles of adjuvant chemotherapy with Taxotere and Cytoxan. I am planning on discontinuing further chemotherapy on her. 5. Diabetic peripheral neuropathy  Thank you very much for the excellent care.

## 2016-04-09 NOTE — Progress Notes (Signed)
NP on call was notified of patient's elevated WBC of 54.9 via text page.  No new orders received. Will continue to monitor patient.

## 2016-04-09 NOTE — Progress Notes (Signed)
PROGRESS NOTE    Hannah [name]  TGG:[id number] DOB: 09/19/1945 DOA: 04/06/2016 PCP: Nicola Girt, DO    Brief Narrative: Hannah [name] is a 70 y.o. female with history with history of anemia, CHF, coronary artery disease, hypertension, breast cancer currently undergoing chemotherapy, presents to emergency department with complaint of left leg pain, redness, swelling. She was admitted for cellulitis of the lower extremities.  Meanwhile during her hospitalization, she went in to afib with RVR several times and back to NSR with cardizem.   Assessment & Plan:   Principal Problem:   Cellulitis of left leg Active Problems:   Atrial fibrillation (HCC)   Hypertension   CHF (congestive heart failure) (HCC)   Nonischemic cardiomyopathy (HCC)   Diabetes mellitus, type 2 (HCC)   S/P aortic valve replacement   CAD (coronary artery disease)   Atrial tachycardia (HCC)   Unstable angina (HCC)   CKD (chronic kidney disease)   Chronic diastolic CHF (congestive heart failure) (HCC)   Breast cancer of upper-inner quadrant of right female breast (HCC)   Chemotherapy induced nausea and vomiting   GERD (gastroesophageal reflux disease)   Chemotherapy-induced thrombocytopenia   Osteomyelitis of toe of left foot (HCC)   Bilateral lower extremity cellulitis: Started on IV vancomycin and cefepime,  Elevate the legs. US duplex negative for DVT.  Repeat lactic acid ordered and normalized.  Wound care.  Improving erythema and tenderness.  Plan to change to oral antibiotics in am.     IDDM: CBG (last 3)   Recent Labs  04/08/16 2158 04/09/16 0737 04/09/16 1138  GLUCAP 189* 178* 258*    Resume SSI, stopped the premeal coverage for low cbgs.     Chronic atrial fibrillation: Rate better controlled today. Cardiology consulted and plan to change  Metoprolol 100 mg daily to to 100 mg bid  Echo ordered, not significant.  Slightly elevated troponins, suspect from demand ischemia. She  currently denies any chest pain or sob.  EKG shows some non specific st t wave changes, recommend outpatient stress test, when she is more stable.    CAD: No chest pain.   NICM; Chronic diastolic CHF: Appears to be compensated.   Elevated D DIMER: V/Q scan negative for PE.   Leukocytosis: Probably from neulasta?  No further intervention. Monitor.       DVT prophylaxis: heparin.  Code Status: (Full/) Family Communication: husband at bedside, discussed the plan of care.  Disposition Plan:  Pending resolution of the cellulitis. Home tomorrow   Consultants:   None.    Procedures: none.    Antimicrobials: vancomycin   Cefepime    Subjective: Reports she is feeling better than yesterday.no new complaints.  Objective: Vitals:   04/09/16 0456 04/09/16 0937 04/09/16 1115 04/09/16 1300  BP: (!) 115/48 (!) 113/51  (!) 115/55  Pulse: 89 81  79  Resp: 20   18  Temp: 97.9 F (36.6 C)   98.7 F (37.1 C)  TempSrc: Oral   Oral  SpO2: 92%  92% 93%  Weight:      Height:        Intake/Output Summary (Last 24 hours) at 04/09/16 1645 Last data filed at 04/08/16 2112  Gross per 24 hour  Intake               50 ml  Output                0 ml  Net  50 ml   Filed Weights   04/06/16 1314 04/06/16 1808  Weight: 96.2 kg (212 lb) 97.1 kg (214 lb)    Examination:  General exam: Appears calm and comfortable  Respiratory system: Clear to auscultation. Respiratory effort normal. Cardiovascular system: S1 & S2 heard, RRR. No JVD, murmurs, rubs, gallops or clicks Gastrointestinal system: Abdomen is nondistended, soft and nontender. No organomegaly or masses felt. Normal bowel sounds heard. Central nervous system: Alert and oriented. No focal neurological deficits. Extremities: bilateral lower extremities erythema and swelling with tenderness on touch.  Skin: No rashes, lesions or ulcers Psychiatry: Judgement and insight appear normal. Mood & affect appropriate.      Data Reviewed: I have personally reviewed following labs and imaging studies  CBC:  Recent Labs Lab 04/04/16 0850 04/06/16 1400 04/07/16 0920 04/08/16 0500 04/09/16 0318  WBC 13.6* 6.3 17.6* 41.9* 54.9*  NEUTROABS 12.5* 3.5  --  39.4*  --   HGB 9.6* 8.5* 7.9* 8.4* 8.1*  HCT 30.2* 27.0* 25.4* 26.9* 26.4*  MCV 93.2 96.1 97.7 97.5 98.9  PLT 63 Few Large & giant platelets* 122* 145* 202 494   Basic Metabolic Panel:  Recent Labs Lab 04/04/16 0850 04/06/16 1400 04/07/16 0920 04/08/16 0500 04/09/16 0318  NA 140 140 138 141 140  K 4.5 3.7 4.1 4.0 3.6  CL  --  105 105 106 105  CO2 30* 29 27 28 28   GLUCOSE 138 106* 238* 158* 176*  BUN 32.1* 29* 30* 28* 26*  CREATININE 1.4* 1.54* 1.64* 1.71* 1.70*  CALCIUM 9.0 8.5* 8.4* 8.6* 8.5*   GFR: Estimated Creatinine Clearance: 35.5 mL/min (by C-G formula based on SCr of 1.7 mg/dL (H)). Liver Function Tests:  Recent Labs Lab 04/04/16 0850 04/06/16 1400 04/07/16 0920  AST 25 41 35  ALT 17 25 22   ALKPHOS 88 65 53  BILITOT 0.94 0.8 1.3*  PROT 5.8* 5.7* 5.0*  ALBUMIN 2.4* 2.6* 2.3*   No results for input(s): LIPASE, AMYLASE in the last 168 hours. No results for input(s): AMMONIA in the last 168 hours. Coagulation Profile: No results for input(s): INR, PROTIME in the last 168 hours. Cardiac Enzymes:  Recent Labs Lab 04/08/16 0500 04/08/16 1055 04/08/16 1725  TROPONINI 0.32* 0.38* 0.45*   BNP (last 3 results) No results for input(s): PROBNP in the last 8760 hours. HbA1C: No results for input(s): HGBA1C in the last 72 hours. CBG:  Recent Labs Lab 04/08/16 1212 04/08/16 1705 04/08/16 2158 04/09/16 0737 04/09/16 1138  GLUCAP 249* 167* 189* 178* 258*   Lipid Profile: No results for input(s): CHOL, HDL, LDLCALC, TRIG, CHOLHDL, LDLDIRECT in the last 72 hours. Thyroid Function Tests:  Recent Labs  04/08/16 0656  TSH 3.355   Anemia Panel: No results for input(s): VITAMINB12, FOLATE, FERRITIN, TIBC, IRON,  RETICCTPCT in the last 72 hours. Sepsis Labs:  Recent Labs Lab 04/06/16 1410 04/06/16 1638 04/07/16 1918  LATICACIDVEN 1.79 2.09* 1.9    Recent Results (from the past 240 hour(s))  Blood culture (routine x 2)     Status: None (Preliminary result)   Collection Time: 04/06/16  1:45 PM  Result Value Ref Range Status   Specimen Description BLOOD PORTA CATH  Final   Special Requests BOTTLES DRAWN AEROBIC AND ANAEROBIC 5CC  Final   Culture   Final    NO GROWTH 3 DAYS Performed at [facility]    Report Status PENDING  Incomplete  Blood culture (routine x 2)     Status: None (Preliminary  result)   Collection Time: 04/06/16  2:34 PM  Result Value Ref Range Status   Specimen Description BLOOD LEFT HAND  Final   Special Requests BOTTLES DRAWN AEROBIC AND ANAEROBIC 5CC  Final   Culture   Final    NO GROWTH 3 DAYS Performed at [facility]    Report Status PENDING  Incomplete         Radiology Studies: Dg Chest 2 View  Result Date: 04/09/2016 CLINICAL DATA:  Chronic diastolic congestive heart failure. Breast cancer undergoing chemotherapy. Atrial a arrhythmia. EXAM: CHEST  2 VIEW COMPARISON:  01/18/2016 FINDINGS: Previous median sternotomy and CABG. Previous aortic valve replacement. Power port inserted from a left subclavian approach with the tip in the SVC above right atrium. The heart is enlarged. There is aortic atherosclerosis. There is pulmonary venous hypertension without evidence of edema. There are small effusions in the posterior costophrenic angles. No acute bone finding. Old right humeral head fracture. IMPRESSION: Pulmonary venous hypertension and small effusions in the posterior costophrenic angle, consistent with low level congestive heart failure. Electronically Signed   By: Nelson Chimes M.D.   On: 04/09/2016 15:48   Nm Pulmonary Perf And Vent  Result Date: 04/09/2016 CLINICAL DATA:  Shortness of breath. Tachycardia. Previous history of pulmonary  embolism. EXAM: NUCLEAR MEDICINE VENTILATION - PERFUSION LUNG SCAN TECHNIQUE: Ventilation images were obtained in multiple projections using inhaled aerosol Tc-85m DTPA. Perfusion images were obtained in multiple projections after intravenous injection of Tc-64m MAA. RADIOPHARMACEUTICALS:  30.2 mCi Technetium-56m DTPA aerosol inhalation and 4.2 mCi Technetium-74m MAA IV COMPARISON:  Chest radiograph also performed today. FINDINGS: Ventilation: No focal ventilation defect. Perfusion: No wedge shaped peripheral perfusion defects to suggest acute pulmonary embolism. IMPRESSION: No evidence of acute pulmonary embolism. Electronically Signed   By: Earle Gell M.D.   On: 04/09/2016 15:36        Scheduled Meds: . acetaminophen  650 mg Oral Q6H   Or  . acetaminophen  650 mg Rectal Q6H  . aspirin  81 mg Oral Daily  . bumetanide  1 mg Oral Daily  . ceFEPime (MAXIPIME) IV  2 g Intravenous Q24H  . clopidogrel  75 mg Oral Daily  . docusate sodium  100 mg Oral BID  . gabapentin  300 mg Oral TID  . heparin  5,000 Units Subcutaneous Q8H  . insulin aspart  0-20 Units Subcutaneous TID WC  . insulin aspart  0-5 Units Subcutaneous QHS  . loratadine  10 mg Oral Daily  . LORazepam  1 mg Oral QHS  . metoprolol succinate  100 mg Oral Daily  . metoprolol succinate  50 mg Oral QPM  . mometasone-formoterol  2 puff Inhalation BID  . rosuvastatin  40 mg Oral Daily  . vancomycin  1,250 mg Intravenous Q24H   Continuous Infusions:    LOS: 3 days    Time spent: 35 minutes.     Hosie Poisson, MD [facility] Pager [phone]  If 7PM-7AM, please contact night-coverage [email or web address] Password [email or web address] 04/09/2016, 4:45 PM

## 2016-04-09 NOTE — Consult Note (Signed)
CARDIOLOGY CONSULT NOTE   Patient ID: Hannah [name] MRN: [id number] DOB/AGE: 1946/03/17 70 y.o.  Admit date: 04/06/2016  Requesting Physician: Dr. Karleen Hampshire  Primary Physician:   Nicola Girt, DO Primary Cardiologist: Dr. Aundra Dubin / Dr. Lovena Le --> Dr. Stanford Breed Reason for Consultation:   Elevated troponin and afib with RVR  HPI: Hannah [name] is a 70 y.o. female with a history of CAD s/p CABG (first surgery in 1998 with redo in 2009 at the time of AVR), bioprosthetic AVR, CKD, post operative atrial fibrillation, atrial tachycardia, CVA, carotid artery stenosis, chronic diastolic CHF, breast cancer currently undergoing chemo who presented to [facility] on 04/06/16 for a left leg cellulitis. She had some atrial tachycardia with RVR and troponin elevated and cardiology was consulted.   She has a history of CAD s/p CABG, bioprosthetic AVR, CKD, paroxysmal atrial fibrillation presents for cardiology followup. She had her initial CABG in 1998, then had redo CABG in 2009 with bioprosthetic AVR. She does not remember when she last had documented atrial fibrillation, but she thinks it was a number of years ago, sounds peri-operative around the time of her redo CABG. She was never put on anticoagulation but had been on dronedarone.   Event monitor in 8/16 showed atrial tachycardia versus atypical atrial flutter. Toprol XL was increased to 100 mg bid. She saw Dr Lovena Le who thought that she had short runs of atrial tachycardia and not atrial flutter. Therefore, she has not been anticoagulated. She has been having occasional brief palpitations but overall was felt to be doing better.   She was recently diagnosed with breast cancer s/p lumpectomy and started on chemo in August. She was recently diagnosed with LLE cellulitis. She was started on IV vancomycin in the office with very little improvement. She was admitted to [facility] on 04/06/16. During her admission she was noted to go into atrial tachycardia with  HRs up to 180s. She did feel palpitations and some SOB. Troponin was noted to be elevated 0.32--> 0.45. She has had no chest pain. She does feel her heart racing. Episode last night felt like her heart was racing faster than her palpitations at home. No dizziness or syncope. She does have significant DOE at home that is unchanged. No LE edema, orthopnea or PND. No blood in her stool or urine.     Past Medical History:  Diagnosis Date  . Abnormally small mouth   . Anemia   . Bilateral carotid artery stenosis    Bilateral ICA 40-59%/  >50% LECA  . Breast cancer ([place])   . Breast cancer of upper-inner quadrant of right female breast ([place]) 12/16/2015  . Bruises easily   . CAD (coronary artery disease) cardiologist-  dr Aundra Dubin   Remote CABG in 1998 with redo in 2009  . CHF (congestive heart failure) (HCC)    EF is low normal at 50 to 55% per echo in Jan. 2011  . Dyspnea   . GERD (gastroesophageal reflux disease)   . Hearing loss of right ear   . History of non-ST elevation myocardial infarction (NSTEMI)    Sept 2014--  thought to be type II HTN w/ LHC without infarct related artery and patent grafts  . Hyperlipidemia   . Hypertension   . Iron deficiency anemia   . Moderate persistent asthma    pulmologist-  Dr. Malvin Johns  . Nausea & vomiting    related to stone  . Nonischemic cardiomyopathy ([place])   . PAF (paroxysmal  atrial fibrillation) ([place])   . PONV (postoperative nausea and vomiting)    pt states patch worked well before  . Psoriasis    right leg  . Renal calculus, right   . Renal insufficiency, mild   . S/P AVR    prosthesis valve placement 2009 at same time re-do CABG  . Stroke [facility])    residual rt hearing loss  . Type 2 diabetes mellitus ([facility])    monitored by dr Dwyane Dee     Past Surgical History:  Procedure Laterality Date  . AORTIC VALVE REPLACEMENT  2009   #13mm [facility] Ease pericardial valve (done same time is CABG)  . BREAST LUMPECTOMY WITH RADIOACTIVE SEED AND  SENTINEL LYMPH NODE BIOPSY Right 01/18/2016   Procedure: RIGHT BREAST LUMPECTOMY WITH RADIOACTIVE SEED AND SENTINEL LYMPH NODE BIOPSY;  Surgeon: Alphonsa Overall, MD;  Location: [place];  Service: General;  Laterality: Right;  . CARDIAC CATHETERIZATION  03/23/2008   Pre-redo CABG: L main OK, LAD (T), CFX (T), OM1 99%, RCA (T), LIMA-LAD OK, SVG-OM(?3) OK w/ little florw to OM2, SVG-RCA OK. EF NL  . CARDIOVASCULAR STRESS TEST  06-15-2013      dr Debara Pickett   [facility] perfusion study/  no evidence ischemia/  normal wall motion, ef 72%  . CARPAL TUNNEL RELEASE    . COLONOSCOPY    . CORONARY ARTERY BYPASS GRAFT  1998 &  re-do 2009   Had LIMA to DX/LAD, SVG to 2 marginal branches and SVG to [facility] originally; SVG to 3rd OM and PD at time of redo  . CYSTOSCOPY W/ URETERAL STENT PLACEMENT Right 12/20/2014   Procedure: CYSTOSCOPY WITH RETROGRADE PYELOGRAM/URETERAL STENT PLACEMENT;  Surgeon: Cleon Gustin, MD;  Location: [facility];  Service: Urology;  Laterality: Right;  . EYE SURGERY Bilateral    cataracts  . HOLMIUM LASER APPLICATION Right 1/61/0960   Procedure:  HOLMIUM LASER LITHOTRIPSY;  Surgeon: Cleon Gustin, MD;  Location: [facility];  Service: Urology;  Laterality: Right;  . LEFT HEART CATHETERIZATION WITH CORONARY/GRAFT ANGIOGRAM N/A 02/23/2013   Procedure: LEFT HEART CATHETERIZATION WITH Beatrix Fetters;  Surgeon: Blane Ohara, MD;  Location: [facility] CATH LAB;  Service: Cardiovascular;  Laterality: N/A;  . PORTACATH PLACEMENT Left 01/18/2016   Procedure: INSERTION PORT-A-CATH;  Surgeon: Alphonsa Overall, MD;  Location: [place];  Service: General;  Laterality: Left;  . TONSILLECTOMY    . TRANSTHORACIC ECHOCARDIOGRAM  02-24-2013      mild LVH,  ef 50-55%/  AV bioprosthesis was present with very mild stenosis and no regurg., mean grandient 66mmHg, peak grandient 91mmHg /  mild MR/  mild LAE and RAE/  moderate TR  . TUBAL LIGATION      Allergies  Allergen Reactions  .  Amoxicillin Rash    Has patient had a PCN reaction causing immediate rash, facial/tongue/throat swelling, SOB or lightheadedness with hypotension: No Has patient had a PCN reaction causing severe rash involving mucus membranes or skin necrosis: Yes Has patient had a PCN reaction that required hospitalization No Has patient had a PCN reaction occurring within the last 10 years: No If all of the above answers are "NO", then may proceed with Cephalosporin use.   . Imdur [Isosorbide Dinitrate] Other (See Comments)    Headache/severe hypotension/Syncope  . Latex Itching  . Tetracycline Rash    I have reviewed the patient's current medications . acetaminophen  650 mg Oral Q6H   Or  . acetaminophen  650 mg Rectal Q6H  .  aspirin  81 mg Oral Daily  . bumetanide  1 mg Oral Daily  . ceFEPime (MAXIPIME) IV  2 g Intravenous Q24H  . clopidogrel  75 mg Oral Daily  . diltiazem  30 mg Oral Q12H  . docusate sodium  100 mg Oral BID  . gabapentin  300 mg Oral TID  . heparin  5,000 Units Subcutaneous Q8H  . insulin aspart  0-20 Units Subcutaneous TID WC  . insulin aspart  0-5 Units Subcutaneous QHS  . loratadine  10 mg Oral Daily  . LORazepam  1 mg Oral QHS  . metoprolol succinate  100 mg Oral Daily  . mometasone-formoterol  2 puff Inhalation BID  . rosuvastatin  40 mg Oral Daily  . vancomycin  1,250 mg Intravenous Q24H     sodium chloride, ondansetron **OR** ondansetron (ZOFRAN) IV, prochlorperazine, sodium chloride flush, traMADol  Prior to Admission medications   Medication Sig Start Date End Date Taking? Authorizing Provider  aspirin EC 81 MG EC tablet Take 1 tablet (81 mg total) by mouth daily. 06/16/13  Yes Brittainy Erie Noe, PA-C  budesonide-formoterol (SYMBICORT) 160-4.5 MCG/ACT inhaler INHALE TWO PUFFS INTO LUNGS TWICE DAILY 07/21/15  Yes Collene Gobble, MD  bumetanide (BUMEX) 1 MG tablet Take 1 tablet (1 mg total) by mouth daily. 03/30/16  Yes Nicholas Lose, MD  clopidogrel (PLAVIX) 75 MG  tablet Take 75 mg by mouth daily.   Yes Historical Provider, MD  gabapentin (NEURONTIN) 300 MG capsule Take 1 capsule (300 mg total) by mouth 3 (three) times daily. 03/26/16  Yes Nicholas Lose, MD  HUMALOG 100 UNIT/ML injection Inject 76 units daily with  V-GO pump 11/17/15  Yes Elayne Snare, MD  lidocaine-prilocaine (EMLA) cream Apply to affected area once 01/26/16  Yes Nicholas Lose, MD  loratadine (CLARITIN) 10 MG tablet Take 10 mg by mouth daily.   Yes Historical Provider, MD  LORazepam (ATIVAN) 2 MG tablet Take 0.5 tablets (1 mg total) by mouth at bedtime. 03/30/16  Yes Nicholas Lose, MD  metoprolol succinate (TOPROL-XL) 100 MG 24 hr tablet TAKE ONE TABLET BY MOUTH TWICE DAILY TAKE  WITH  OR  IMMEDIATELY  FOLLOWING  A  MEAL 06/08/15  Yes Larey Dresser, MD  ondansetron (ZOFRAN) 8 MG tablet TAKE 1 TABLET BY MOUTH 2  TIMES DAILY AS NEEDED FOR  REFRACTORY NAUSEA /  VOMITING. START ON DAY 3  AFTER CHEMO. 03/14/16  Yes Nicholas Lose, MD  rosuvastatin (CRESTOR) 40 MG tablet TAKE ONE TABLET BY MOUTH ONCE DAILY 10/25/15  Yes Larey Dresser, MD  ZETIA 10 MG tablet Take 1 tablet by mouth  daily 11/17/15  Yes Elayne Snare, MD  ACCU-CHEK AVIVA PLUS test strip  08/25/15   Historical Provider, MD  alendronate (FOSAMAX) 70 MG tablet Take 70 mg by mouth once a week. Take with a full glass of water on an empty stomach.    Historical Provider, MD  gabapentin (NEURONTIN) 100 MG capsule  03/26/16   Historical Provider, MD  LORazepam (ATIVAN) 0.5 MG tablet  03/12/16   Historical Provider, MD  omeprazole (PRILOSEC OTC) 20 MG tablet Take 20 mg by mouth daily as needed (heartburn).     Historical Provider, MD  prochlorperazine (COMPAZINE) 10 MG tablet Take 1 tablet (10 mg total) by mouth every 6 (six) hours as needed (Nausea or vomiting). 03/16/16   Nicholas Lose, MD  sucralfate (CARAFATE) 1 GM/10ML suspension Take 10 mLs (1 g total) by mouth 4 (four) times daily -  with  meals and at bedtime. 02/28/16   Susanne Borders, NP  TraMADol  HCl 200 MG CP24 Take 1 tablet by mouth daily as needed (for pain).  12/28/15   Historical Provider, MD  TRULICITY 6.27 OJ/5.0KX SOPN Inject 0.75 mg into the skin every Wednesday.  12/30/15   Historical Provider, MD     Social History   Social History  . Marital status: Married    Spouse name: N/A  . Number of children: N/A  . Years of education: N/A   Occupational History  . Not on file.   Social History Main Topics  . Smoking status: Never Smoker  . Smokeless tobacco: Never Used  . Alcohol use No  . Drug use: No  . Sexual activity: Yes    Birth control/ protection: None   Other Topics Concern  . Not on file   Social History Narrative  . No narrative on file    Family Status  Relation Status  . Mother Deceased  . Father Deceased  . Maternal Grandfather Deceased  . Maternal Grandmother Deceased  . Paternal Grandmother Deceased  . Paternal Grandfather Deceased  . Son    Family History  Problem Relation Age of Onset  . Heart disease Mother   . Heart disease Father   . Heart failure Father   . Diabetes Maternal Grandmother   . Diabetes Son     ROS:  Full 14 point review of systems complete and found to be negative unless listed above.  Physical Exam: Blood pressure (!) 113/51, pulse 81, temperature 97.9 F (36.6 C), temperature source Oral, resp. rate 20, height 5\' 5"  (1.651 m), weight 214 lb (97.1 kg), SpO2 92 %.  General: Well developed, well nourished, female in no acute distress, obese Head: Eyes PERRLA, No xanthomas.   Normocephalic and atraumatic, oropharynx without edema or exudate. Lungs: CTAB Heart: HRRR S1 S2, no rub/gallop, Heart regular rate and rhythm with S1, S2  murmur. pulses are 2+ extrem.   Neck: No carotid bruits. No lymphadenopathy.  No JVD. Abdomen: Bowel sounds present, abdomen soft and non-tender without masses or hernias noted. Msk:  No spine or cva tenderness. No weakness, no joint deformities or effusions. Extremities: No clubbing or  cyanosis. No  edema.  LE with erythema Neuro: Alert and oriented X 3. No focal deficits noted. Psych:  Good affect, responds appropriately Skin: No rashes or lesions noted.  Labs:   Lab Results  Component Value Date   WBC 54.9 (HH) 04/09/2016   HGB 8.1 (L) 04/09/2016   HCT 26.4 (L) 04/09/2016   MCV 98.9 04/09/2016   PLT 209 04/09/2016   No results for input(s): INR in the last 72 hours.  Recent Labs Lab 04/07/16 0920  04/09/16 0318  NA 138  < > 140  K 4.1  < > 3.6  CL 105  < > 105  CO2 27  < > 28  BUN 30*  < > 26*  CREATININE 1.64*  < > 1.70*  CALCIUM 8.4*  < > 8.5*  PROT 5.0*  --   --   BILITOT 1.3*  --   --   ALKPHOS 53  --   --   ALT 22  --   --   AST 35  --   --   GLUCOSE 238*  < > 176*  ALBUMIN 2.3*  --   --   < > = values in this interval not displayed. Magnesium  Date Value Ref Range Status  07/09/2014  1.8 1.5 - 2.5 mg/dL Final    Recent Labs  04/08/16 0500 04/08/16 1055 04/08/16 1725  TROPONINI 0.32* 0.38* 0.45*   No results for input(s): TROPIPOC in the last 72 hours. Pro B Natriuretic peptide (BNP)  Date/Time Value Ref Range Status  10/02/2013 10:13 AM 95.0 0.0 - 100.0 pg/mL Final  07/30/2013 08:55 AM 119.0 (H) 0.0 - 100.0 pg/mL Final   Lab Results  Component Value Date   CHOL 116 (L) 07/05/2015   HDL 38 (L) 07/05/2015   LDLCALC 58 07/05/2015   TRIG 99 07/05/2015   Lab Results  Component Value Date   DDIMER 1.86 (H) 04/08/2016   Lipase  Date/Time Value Ref Range Status  12/06/2014 11:42 PM 23 22 - 51 U/L Final   TSH  Date/Time Value Ref Range Status  04/08/2016 06:56 AM 3.355 0.350 - 4.500 uIU/mL Final    Comment:    Performed by a 3rd Generation assay with a functional sensitivity of <=0.01 uIU/mL.  07/09/2014 08:58 AM 2.11 0.35 - 4.50 uIU/mL Final    Echo: 04/08/2016 LV EF: 60% -   65% Study Conclusions - Left ventricle: The cavity size was normal. There was moderate   focal basal and mild concentric hypertrophy. Systolic  function   was normal. The estimated ejection fraction was in the range of   60% to 65%. Images were inadequate for LV wall motion assessment.   Features are consistent with a pseudonormal left ventricular   filling pattern, with concomitant abnormal relaxation and   increased filling pressure (grade 2 diastolic dysfunction).   Doppler parameters are consistent with high ventricular filling   pressure. - Aortic valve: A bioprosthesis was present and functioning   normally. Mean gradient (S): 17 mm Hg. - Mitral valve: Moderately calcified annulus. - Pulmonary arteries: PA peak pressure: 38 mm Hg (S). Impressions: - The right ventricular systolic pressure was increased consistent   with mild pulmonary hypertension.   ECG:  Normal sinus rhythm HR 89 Non specific ST & T wave abnormality  Radiology:  No results found.  ASSESSMENT AND PLAN:    Principal Problem:   Cellulitis of left leg Active Problems:   Atrial fibrillation (HCC)   Hypertension   CHF (congestive heart failure) (HCC)   Nonischemic cardiomyopathy (HCC)   Diabetes mellitus, type 2 (HCC)   S/P aortic valve replacement   CAD (coronary artery disease)   Atrial tachycardia (HCC)   Unstable angina (HCC)   CKD (chronic kidney disease)   Chronic diastolic CHF (congestive heart failure) (HCC)   Breast cancer of upper-inner quadrant of right female breast (HCC)   Chemotherapy induced nausea and vomiting   GERD (gastroesophageal reflux disease)   Chemotherapy-induced thrombocytopenia   Osteomyelitis of toe of left foot (HCC)  Hannah [name] is a 70 y.o. female with a history of CAD s/p CABG (first surgery in 1998 with redo in 2009 at the time of AVR), bioprosthetic AVR, CKD, post operative atrial fibrillation, atrial tachycardia, CVA, carotid artery stenosis, chronic diastolic CHF, breast cancer currently undergoing chemo who presented to [facility] on 04/06/16 for a left leg cellulitis. She had some atrial tachycardia with RVR  and troponin elevated and cardiology was consulted.   Atrial tachycardia: she has a long history of this. She was on Toprol XL 100mg  BID at home. Currently only on Toprol XL 100mg  daily and Cardizem. We have stopped the Cardizem and will start Toprol XL 50mg  QHS since her BP is soft. No evidence of atrial fibrillation  so no oral anticoagulation warranted. Hopefully atrial tach will improve with improvement of her cellulitis  Elevated troponin: she has had no chest pain. ECG with some non specific ST/TW changes. Will plan for outpatient nuclear stress test   CAD s/p CABG: continue ASA, BB and statin   HTN: BP has been soft  S/p AVR: functioning well by most recent echo  HLD: continue statin   Carotid artery stenosis: 40-59% BICA stenosis. Due for repeat dopplers in 04/2016  Signed: Angelena Form, PA-C 04/09/2016 12:59 PM  Pager [phone]  Co-Sign MD  As above; seen and examined; briefly she is a 70 year old female with past medical history of coronary artery disease status post coronary artery bypass graft in 1998 with redo in 2009 and aortic valve replacement at that time, chronic stage III kidney disease, prior atrial tachycardia, diastolic congestive heart failure, breast cancer undergoing chemotherapy admitted on October 27 with left leg cellulitis for evaluation of atrial tachycardia and elevated troponin. Patient has chronic dyspnea on exertion but no orthopnea or PND. She developed left lower extremity cellulitis 1 week ago. She was admitted for IV antibiotics and is improving. She is noted to have atrial arrhythmias on telemetry and elevated troponin and cardiology asked to evaluate. She denies chest pain.  Electrocardiogram shows sinus rhythm, nonspecific T-wave changes. Telemetry appears to show intermittent atrial tachycardia.   1 probable atrial tachycardia-patient was on Toprol 100 mg twice a day at home. She is presently on 100 mg daily. Increase to 100 in the morning and  50 in the afternoon has blood pressure was somewhat borderline. We will advance to 200 mg total daily if her blood pressure allows.  2 elevated troponin-there is no clear trend and patient is not having chest pain. Will plan outpatient stress nuclear study once her cellulitis improves.  3 lower extremity cellulitis-management per primary care. Continue antibiotics.  4 Coronary artery disease-continue aspirin and statin.  Kirk Ruths, MD

## 2016-04-10 ENCOUNTER — Telehealth: Payer: Self-pay

## 2016-04-10 DIAGNOSIS — I10 Essential (primary) hypertension: Secondary | ICD-10-CM

## 2016-04-10 DIAGNOSIS — N183 Chronic kidney disease, stage 3 (moderate): Secondary | ICD-10-CM

## 2016-04-10 DIAGNOSIS — I5032 Chronic diastolic (congestive) heart failure: Secondary | ICD-10-CM

## 2016-04-10 LAB — CBC WITH DIFFERENTIAL/PLATELET
Basophils Absolute: 0 10*3/uL (ref 0.0–0.1)
Basophils Relative: 0 %
Eosinophils Absolute: 0 10*3/uL (ref 0.0–0.7)
Eosinophils Relative: 0 %
HCT: 25 % — ABNORMAL LOW (ref 36.0–46.0)
Hemoglobin: 7.8 g/dL — ABNORMAL LOW (ref 12.0–15.0)
Lymphocytes Relative: 3 %
Lymphs Abs: 1.3 10*3/uL (ref 0.7–4.0)
MCH: 30.6 pg (ref 26.0–34.0)
MCHC: 31.2 g/dL (ref 30.0–36.0)
MCV: 98 fL (ref 78.0–100.0)
Monocytes Absolute: 2.1 10*3/uL — ABNORMAL HIGH (ref 0.1–1.0)
Monocytes Relative: 5 %
Neutro Abs: 38.3 10*3/uL — ABNORMAL HIGH (ref 1.7–7.7)
Neutrophils Relative %: 92 %
Platelets: 188 10*3/uL (ref 150–400)
RBC: 2.55 MIL/uL — ABNORMAL LOW (ref 3.87–5.11)
RDW: 16.3 % — ABNORMAL HIGH (ref 11.5–15.5)
WBC: 41.7 10*3/uL — ABNORMAL HIGH (ref 4.0–10.5)
nRBC: 1 /100 WBC — ABNORMAL HIGH

## 2016-04-10 LAB — GLUCOSE, CAPILLARY: Glucose-Capillary: 167 mg/dL — ABNORMAL HIGH (ref 65–99)

## 2016-04-10 MED ORDER — CEPHALEXIN 500 MG PO CAPS: 500.0000 mg | ORAL_CAPSULE | Freq: Two times a day (BID) | ORAL | 0 refills | Status: DC

## 2016-04-10 MED ORDER — METOPROLOL SUCCINATE ER 100 MG PO TB24: 100.0000 mg | ORAL_TABLET | Freq: Every day | ORAL | Status: DC

## 2016-04-10 MED ORDER — METOPROLOL SUCCINATE ER 50 MG PO TB24: 50.0000 mg | ORAL_TABLET | Freq: Every evening | ORAL | 0 refills | Status: DC

## 2016-04-10 MED ORDER — METOPROLOL SUCCINATE ER 100 MG PO TB24: 100.0000 mg | ORAL_TABLET | Freq: Two times a day (BID) | ORAL | 0 refills | Status: DC

## 2016-04-10 MED ORDER — DOCUSATE SODIUM 100 MG PO CAPS: 100.0000 mg | ORAL_CAPSULE | Freq: Every day | ORAL | 0 refills | Status: DC | PRN

## 2016-04-10 MED ORDER — METOPROLOL SUCCINATE ER 100 MG PO TB24: 100.0000 mg | ORAL_TABLET | Freq: Two times a day (BID) | ORAL | Status: DC

## 2016-04-10 MED ORDER — HEPARIN SOD (PORK) LOCK FLUSH 100 UNIT/ML IV SOLN
500.0000 [IU] | INTRAVENOUS | Status: AC | PRN
  Administered 2016-04-10: 500 [IU]

## 2016-04-10 NOTE — Telephone Encounter (Signed)
Pt home today on oral antibiotics. Will continue for 5 more days. If not completely better will call [facility] for another appt to see if needs to continue antibiotics. Cellulitis looks much better and she is about to walk better.

## 2016-04-10 NOTE — Care Management Note (Signed)
Case Management Note  Patient Details  Name: Hannah [name] MRN: [id number] Date of Birth: [birth year]  Subjective/Objective:                    Action/Plan:d/c Home.   Expected Discharge Date:   (unknown)               Expected Discharge Plan:  Home/Self Care  In-House Referral:     Discharge planning Services  CM Consult  Post Acute Care Choice:    Choice offered to:     DME Arranged:    DME Agency:     HH Arranged:    [place] Agency:     Status of Service:  Completed, signed off  If discussed at [employer] of Stay Meetings, dates discussed:    Additional Comments:  Dessa Phi, RN 04/10/2016, 10:45 AM

## 2016-04-10 NOTE — Progress Notes (Signed)
    Subjective:  Denies CP or dyspnea   Objective:  Vitals:   04/09/16 1724 04/09/16 2147 04/10/16 0508 04/10/16 0836  BP: 117/64 (!) 112/45 (!) 119/45   Pulse: 82 86 85   Resp:  20 20   Temp:  98.5 F (36.9 C) 97.5 F (36.4 C)   TempSrc:  Oral Oral   SpO2:  96% 99% 98%  Weight:      Height:        Intake/Output from previous day:  Intake/Output Summary (Last 24 hours) at 04/10/16 1123 Last data filed at 04/10/16 0935  Gross per 24 hour  Intake              560 ml  Output              250 ml  Net              310 ml    Physical Exam: Physical exam: Well-developed well-nourished in no acute distress.  Skin is warm and dry.  HEENT is normal.  Neck is supple.  Chest is clear to auscultation with normal expansion.  Cardiovascular exam is regular rate and rhythm.  Abdominal exam nontender or distended. No masses palpated. Extremities show trace edema; erythema on right improving neuro grossly intact    Lab Results: Basic Metabolic Panel:  Recent Labs  04/08/16 0500 04/09/16 0318  NA 141 140  K 4.0 3.6  CL 106 105  CO2 28 28  GLUCOSE 158* 176*  BUN 28* 26*  CREATININE 1.71* 1.70*  CALCIUM 8.6* 8.5*   CBC:  Recent Labs  04/08/16 0500 04/09/16 0318 04/10/16 0930  WBC 41.9* 54.9* 41.7*  NEUTROABS 39.4*  --  38.3*  HGB 8.4* 8.1* 7.8*  HCT 26.9* 26.4* 25.0*  MCV 97.5 98.9 98.0  PLT 202 209 188   Cardiac Enzymes:  Recent Labs  04/08/16 0500 04/08/16 1055 04/08/16 1725  TROPONINI 0.32* 0.38* 0.45*     Assessment/Plan:  Hannah [name] is a 70 y.o. female with a history ofCAD s/p CABG (first surgery in 1998 with redo in 2009 at the time of AVR), bioprosthetic AVR, CKD, post operative atrial fibrillation, atrial tachycardia, CVA, carotid artery stenosis, chronic diastolic CHF, breast cancer currently undergoing chemo who presented to [facility] on 04/06/16 for a left leg cellulitis. She had some atrial tachycardia with RVR and troponin elevated and  cardiology was consulted.   1 probable atrial tachycardia-patient was on Toprol 100 mg twice a day at home. Would resume home dose at DC.  2 elevated troponin-there is no clear trend and patient is not having chest pain. Will plan outpatient stress nuclear study once her cellulitis improves.  3 lower extremity cellulitis-management per primary care. Continue antibiotics.  4 Coronary artery disease-continue aspirin and statin.  Pt can be DCed from a cardiac standpoint; FU with PA in 2-4 weeks and me in 3 months  Hannah [name] 04/10/2016, 11:23 AM

## 2016-04-10 NOTE — Care Management Important Message (Signed)
Important Message  Patient Details  Name: Hannah [name] MRN: [id number] Date of Birth: 1945-10-27   Medicare Important Message Given:  Yes    Camillo Flaming 04/10/2016, 9:27 AMImportant Message  Patient Details  Name: Hannah [name] MRN: [id number] Date of Birth: 04-16-1946   Medicare Important Message Given:  Yes    Camillo Flaming 04/10/2016, 9:27 AM

## 2016-04-11 LAB — CULTURE, BLOOD (ROUTINE X 2)
Culture: NO GROWTH
Culture: NO GROWTH

## 2016-04-16 ENCOUNTER — Ambulatory Visit (HOSPITAL_BASED_OUTPATIENT_CLINIC_OR_DEPARTMENT_OTHER): Payer: Medicare Other | Admitting: Nurse Practitioner

## 2016-04-16 ENCOUNTER — Encounter: Payer: Self-pay | Admitting: Nurse Practitioner

## 2016-04-16 ENCOUNTER — Telehealth: Payer: Self-pay

## 2016-04-16 VITALS — BP 126/59 | HR 87 | Temp 98.0°F | Resp 18 | Ht 65.0 in | Wt 215.6 lb

## 2016-04-16 DIAGNOSIS — L03116 Cellulitis of left lower limb: Secondary | ICD-10-CM | POA: Diagnosis not present

## 2016-04-16 DIAGNOSIS — C50211 Malignant neoplasm of upper-inner quadrant of right female breast: Secondary | ICD-10-CM

## 2016-04-16 MED ORDER — CEPHALEXIN 500 MG PO CAPS: 500.0000 mg | ORAL_CAPSULE | Freq: Two times a day (BID) | ORAL | 0 refills | Status: DC

## 2016-04-16 NOTE — Telephone Encounter (Signed)
Received VM from pt stating she was discharged from hospital on 10/31 after admission to manage cellulitis.  Pt reports she was given 5 days of antibiotics and told to call our office if after 5 days she was still experiencing symptoms of cellulitis.  Pt reports she took her last dose of antibiotic yesterday and still notes redness and warmth to site.  Pt states it is much improved but still present and is asking if she needs additional antibiotic coverage.  Upon chart review, pt has been taking Keflex 500mg  PO BID.  Spoke with Dr. Jana Hakim, on-call MD, in Dr. Geralyn Flash absence who wishes for pt to be seen and evaluated by Selena Lesser, NP in [facility].  Called pt to inform her of this plan and she is in agreement.  Appt set for 1pm with [facility].  Pt verbalized understanding and is without further questions or concerns at time of call.

## 2016-04-16 NOTE — Assessment & Plan Note (Signed)
Patient into the cancer Center today for recheck of her lower extremity cellulitis.  She was admitted to the hospital for treatment of the cellulitis on 04/06/2016; and was released from the hospital on 04/10/2016.  She states that she completed the Keflex antibiotics that she was given upon discharge, just yesterday.  She denies any recent fevers or chills.  She states that her legs have greatly improved; and she can now ambulate with no pain.  Exam today reveals mild erythema to the anterior lower leg portion; with a trace of erythema only to the right lower extremity anterior portion.  Patient has no erythema or edema to her feet.  Whatsoever.  Also, patient was afebrile today.  Patient received cycle 3 of her Taxotere/cyclophosphamide chemotherapy on 03/30/2016.  Decision was made to hold any further chemotherapy after this last cycle.  Per Dr. Elease Etienne will have all appointments canceled for this coming Friday, 04/20/2016; and will instead.  Return for labs and a follow-up appointment with Dr. Lindi Adie on Friday, 04/27/2016.  Patient will be given a prescription refill of the Keflex to take twice daily until she returns for follow-up visit with Dr. Lindi Adie on Friday, 04/27/2016.  Also, patient was advised to call/return or go directly to the emergency department for any worsening symptoms whatsoever.

## 2016-04-16 NOTE — Assessment & Plan Note (Signed)
Patient into the cancer Center today for recheck of her lower extremity cellulitis.  She was admitted to the hospital for treatment of the cellulitis on 04/06/2016; and was released from the hospital on 04/10/2016.  She states that she completed the Keflex antibiotics that she was given upon discharge, just yesterday.  She denies any recent fevers or chills.  She states that her legs have greatly improved; and she can now ambulate with no pain.  Exam today reveals mild erythema to the anterior lower leg portion; with a trace of erythema only to the right lower extremity anterior portion.  Patient has no erythema or edema to her feet.  Whatsoever.  Also, patient was afebrile today.  Patient will be given a prescription refill of the Keflex to take twice daily until she returns for follow-up visit with Dr. Lindi Adie on Friday, 04/27/2016.  Also, patient was advised to call/return or go directly to the emergency department for any worsening symptoms whatsoever.

## 2016-04-16 NOTE — Progress Notes (Signed)
SYMPTOM MANAGEMENT CLINIC    Chief Complaint: Follow-up cellulitis  HPI:  Hannah [name] 70 y.o. female diagnosed with breast cancer.  Recently completed Taxotere/cyclophosphamide chemotherapy regimen.     Breast cancer of upper-inner quadrant of right female breast ([place])   12/14/2015 Initial Diagnosis    Right breast biopsy 12:30 position: 2 masses, 1.6 cm mass: Invasive ductal carcinoma, grade 2, ER 0%, PR 0%, HER-2 negative, Ki-67 70%; satellite mass 8 mm: IDC grade 2 ER 5%, PR 5%, HER-2 negative, Ki-67 50%; T1cN0 stage IA clinical stage      01/18/2016 Surgery    Right lumpectomy: Multifocal IDC grade 3, 1.9 cm  ER 0%, PR 0%, HER-2 negative, Ki-67 70% and 0.8 cm satellite mass ER 5%, PR 2%, HER-2 negative, Ki-67 50%, high-grade DCIS, margins negative, 0/1 lymph nodes negative T1 cN0 stage IA      02/17/2016 -  Anti-estrogen oral therapy    Adjuvant chemotherapy with Taxotere and Cytoxan4       Review of Systems  Skin:       Bilateral lower extremity cellulitis  All other systems reviewed and are negative.   Past Medical History:  Diagnosis Date  . Abnormally small mouth   . Anemia   . Bilateral carotid artery stenosis    Bilateral ICA 40-59%/  >50% LECA  . Breast cancer ([place])   . Breast cancer of upper-inner quadrant of right female breast ([place]) 12/16/2015  . Bruises easily   . CAD (coronary artery disease) cardiologist-  dr Aundra Dubin   Remote CABG in 1998 with redo in 2009  . CHF (congestive heart failure) (HCC)    EF is low normal at 50 to 55% per echo in Jan. 2011  . Dyspnea   . GERD (gastroesophageal reflux disease)   . Hearing loss of right ear   . History of non-ST elevation myocardial infarction (NSTEMI)    Sept 2014--  thought to be type II HTN w/ LHC without infarct related artery and patent grafts  . Hyperlipidemia   . Hypertension   . Iron deficiency anemia   . Moderate persistent asthma    pulmologist-  Dr. Malvin Johns  . Nausea & vomiting    related to  stone  . Nonischemic cardiomyopathy ([place])   . PAF (paroxysmal atrial fibrillation) ([place])   . PONV (postoperative nausea and vomiting)    pt states patch worked well before  . Psoriasis    right leg  . Renal calculus, right   . Renal insufficiency, mild   . S/P AVR    prosthesis valve placement 2009 at same time re-do CABG  . 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 Type 2 diabetes mellitus ([place])    monitored by dr Dwyane Dee    Past Surgical History:  Procedure Laterality Date  . AORTIC VALVE REPLACEMENT  2009   [facility] same time is [facility])  . BREAST LUMPECTOMY WITH RADIOACTIVE SEED AND SENTINEL LYMPH NODE BIOPSY Right 01/18/2016   Procedure: RIGHT BREAST LUMPECTOMY WITH RADIOACTIVE SEED AND SENTINEL LYMPH NODE BIOPSY;  Surgeon: DAlphonsa Overall MD;  [facility]  Service: General;  Laterality: Right;  . CARDIAC CATHETERIZATION  03/23/2008   Pre-redo CABG: L main OK, LAD (T), CFX (T), OM1 99%, RCA (T), LIMA-LAD OK, SVG-OM(?3) OK w/ little florw to OM2, SVG-RCA OK. EF NL  . CARDIOVASCULAR STRESS TEST  06-15-2013      dr hDebara Pickett  [place]/  no evidence ischemia/  normal wall motion, ef 72%  . CARPAL TUNNEL RELEASE    . COLONOSCOPY    . CORONARY ARTERY BYPASS GRAFT  1998 &  re-do 2009   Had LIMA to DX/LAD, SVG to 2 marginal branches and SVG to [facility] originally; SVG to 3rd OM and PD at time of redo  . CYSTOSCOPY W/ URETERAL STENT PLACEMENT Right 12/20/2014   Procedure: CYSTOSCOPY WITH RETROGRADE PYELOGRAM/URETERAL STENT PLACEMENT;  Surgeon: Cleon Gustin, MD;  Location: [facility];  Service: Urology;  Laterality: Right;  . EYE SURGERY Bilateral    cataracts  . HOLMIUM LASER APPLICATION Right 5/46/5681   Procedure:  HOLMIUM LASER LITHOTRIPSY;  Surgeon: Cleon Gustin, MD;  Location: [facility];  Service: Urology;  Laterality: Right;  . LEFT HEART CATHETERIZATION WITH CORONARY/GRAFT ANGIOGRAM N/A  02/23/2013   Procedure: LEFT HEART CATHETERIZATION WITH Beatrix Fetters;  Surgeon: Blane Ohara, MD;  Location: [facility] CATH LAB;  Service: Cardiovascular;  Laterality: N/A;  . PORTACATH PLACEMENT Left 01/18/2016   Procedure: INSERTION PORT-A-CATH;  Surgeon: Alphonsa Overall, MD;  Location: [place];  Service: General;  Laterality: Left;  . TONSILLECTOMY    . TRANSTHORACIC ECHOCARDIOGRAM  02-24-2013      mild LVH,  ef 50-55%/  AV bioprosthesis was present with very mild stenosis and no regurg., mean grandient 50mHg, peak grandient 219mg /  mild MR/  mild LAE and RAE/  moderate TR  . TUBAL LIGATION      has HYPERLIPIDEMIA; HYPERTENSION; Atrial fibrillation ([facility] Allergic rhinitis; Asthma; DYSPNEA; COUGH; Hypertension; Hyperlipidemia; SOB (shortness of breath); CHF (congestive heart failure) ([facility] Renal insufficiency, mild; Dyspnea; Nonischemic cardiomyopathy (HCM[place] Anemia; Diabetes mellitus, type 2 ([facility] Iron deficiency anemia; S/P aortic valve replacement; CAD (coronary artery disease); Non-ST elevation myocardial infarction (NSTEMI), initial episode of care ([facility] Atrial tachycardia ([facility] Unstable angina (HCWans[facility] Chest pain; Acute on chronic combined systolic and diastolic CHF, NYHA class 3 (HCAdin CKD (chronic kidney disease); Chronic diastolic CHF (congestive heart failure) (HCWhe[place] Acute bronchitis; Breast cancer of upper-inner quadrant of right female breast (HCBigel[facility] [place] catheter in place; Chemotherapy induced nausea and vomiting; GERD (gastroesophageal reflux disease); Mania ([facility]New[place] Chemotherapy-induced thrombocytopenia; Cellulitis of left leg; and Osteomyelitis of toe of left foot (HCC) on her problem list.    is allergic to amoxicillin; imdur [isosorbide dinitrate]; latex; and tetracycline.    Medication List       Accurate as of 04/16/16  4:32 PM. Always use your most recent med list.          ACCU-CHEK AVIVA PLUS test strip Generic drug:  glucose blood   alendronate 70 MG  tablet Commonly known as:  FOSAMAX Take 70 mg by mouth once a week. Take with a full glass of water on an empty stomach.   aspirin 81 MG EC tablet Take 1 tablet (81 mg total) by mouth daily.   budesonide-formoterol 160-4.5 MCG/ACT inhaler Commonly known as:  SYMBICORT INHALE TWO PUFFS INTO LUNGS TWICE DAILY   bumetanide 1 MG tablet Commonly known as:  BUMEX Take 1 tablet (1 mg total) by mouth daily.   cephALEXin 500 MG capsule Commonly known as:  KEFLEX Take 1 capsule (500 mg total) by mouth 2 (two) times daily.   clopidogrel 75 MG tablet Commonly known as:  PLAVIX Take 75 mg by mouth daily.   docusate sodium 100 MG capsule Commonly known as:  COLACE Take 1 capsule (100 mg total) by mouth daily as needed for mild constipation.  gabapentin 300 MG capsule Commonly known as:  NEURONTIN Take 1 capsule (300 mg total) by mouth 3 (three) times daily.   HUMALOG 100 UNIT/ML injection Generic drug:  insulin lispro Inject 76 units daily with  V-GO pump   lidocaine-prilocaine cream Commonly known as:  EMLA Apply to affected area once   loratadine 10 MG tablet Commonly known as:  CLARITIN Take 10 mg by mouth daily.   LORazepam 2 MG tablet Commonly known as:  ATIVAN Take 0.5 tablets (1 mg total) by mouth at bedtime.   metoprolol succinate 100 MG 24 hr tablet Commonly known as:  TOPROL-XL Take 1 tablet (100 mg total) by mouth 2 (two) times daily. Take with or immediately following a meal.   omeprazole 20 MG tablet Commonly known as:  PRILOSEC OTC Take 20 mg by mouth daily as needed (heartburn).   ondansetron 8 MG tablet Commonly known as:  ZOFRAN TAKE 1 TABLET BY MOUTH 2  TIMES DAILY AS NEEDED FOR  REFRACTORY NAUSEA /  VOMITING. START ON DAY 3  AFTER CHEMO.   prochlorperazine 10 MG tablet Commonly known as:  COMPAZINE Take 1 tablet (10 mg total) by mouth every 6 (six) hours as needed (Nausea or vomiting).   rosuvastatin 40 MG tablet Commonly known as:  CRESTOR TAKE  ONE TABLET BY MOUTH ONCE DAILY   sucralfate 1 GM/10ML suspension Commonly known as:  CARAFATE Take 10 mLs (1 g total) by mouth 4 (four) times daily -  with meals and at bedtime.   TraMADol HCl 200 MG Cp24 Take 1 tablet by mouth daily as needed (for pain).   TRULICITY 3.83 FX/8.3AN Sopn Generic drug:  Dulaglutide Inject 0.75 mg into the skin every Wednesday.   ZETIA 10 MG tablet Generic drug:  ezetimibe Take 1 tablet by mouth  daily        PHYSICAL EXAMINATION  Oncology Vitals 04/16/2016 04/10/2016  Height 165 cm -  Weight 97.796 kg -  Weight (lbs) 215 lbs 10 oz -  BMI (kg/m2) 35.88 kg/m2 -  Temp 98 -  Pulse 87 -  Resp 18 -  SpO2 100 98  BSA (m2) 2.12 m2 -   BP Readings from Last 2 Encounters:  04/16/16 (!) 126/59  04/10/16 (!) 119/45    Physical Exam  Constitutional: She is oriented to person, place, and time. She appears unhealthy.  HENT:  Head: Normocephalic and atraumatic.  Eyes: Conjunctivae and EOM are normal. Pupils are equal, round, and reactive to light.  Neck: Normal range of motion.  Pulmonary/Chest: Effort normal. No respiratory distress.  Musculoskeletal: Normal range of motion. She exhibits no edema or tenderness.  Neurological: She is alert and oriented to person, place, and time. Gait normal.  Skin: Skin is warm and dry. There is erythema.  Exam today reveals mild erythema to the anterior lower leg portion; with a trace of erythema only to the right lower extremity anterior portion.  Patient has no erythema or edema to her feet.  Whatsoever.  Also, patient was afebrile today.    Psychiatric: Affect normal.  Nursing note and vitals reviewed.   LABORATORY DATA:. No visits with results within 3 Day(s) from this visit.  Latest known visit with results is:  Admission on 04/06/2016, Discharged on 04/10/2016  Component Date Value Ref Range Status  . WBC 04/06/2016 6.3  4.0 - 10.5 K/uL Final  . RBC 04/06/2016 2.81* 3.87 - 5.11 MIL/uL Final  .  Hemoglobin 04/06/2016 8.5* 12.0 - 15.0 g/dL Final  .  HCT 04/06/2016 27.0* 36.0 - 46.0 % Final  . MCV 04/06/2016 96.1  78.0 - 100.0 fL Final  . MCH 04/06/2016 30.2  26.0 - 34.0 pg Final  . MCHC 04/06/2016 31.5  30.0 - 36.0 g/dL Final  . RDW 04/06/2016 15.3  11.5 - 15.5 % Final  . Platelets 04/06/2016 122* 150 - 400 K/uL Final  . Neutrophils Relative % 04/06/2016 57  % Final  . Lymphocytes Relative 04/06/2016 12  % Final  . Monocytes Relative 04/06/2016 27  % Final  . Eosinophils Relative 04/06/2016 3  % Final  . Basophils Relative 04/06/2016 1  % Final  . Neutro Abs 04/06/2016 3.5  1.7 - 7.7 K/uL Final  . Lymphs Abs 04/06/2016 0.8  0.7 - 4.0 K/uL Final  . Monocytes Absolute 04/06/2016 1.7* 0.1 - 1.0 K/uL Final  . Eosinophils Absolute 04/06/2016 0.2  0.0 - 0.7 K/uL Final  . Basophils Absolute 04/06/2016 0.1  0.0 - 0.1 K/uL Final  . WBC Morphology 04/06/2016 TOXIC GRANULATION   Final  . Sodium 04/06/2016 140  135 - 145 mmol/L Final  . Potassium 04/06/2016 3.7  3.5 - 5.1 mmol/L Final  . Chloride 04/06/2016 105  101 - 111 mmol/L Final  . CO2 04/06/2016 29  22 - 32 mmol/L Final  . Glucose, Bld 04/06/2016 106* 65 - 99 mg/dL Final  . BUN 04/06/2016 29* 6 - 20 mg/dL Final  . Creatinine, Ser 04/06/2016 1.54* 0.44 - 1.00 mg/dL Final  . Calcium 04/06/2016 8.5* 8.9 - 10.3 mg/dL Final  . Total Protein 04/06/2016 5.7* 6.5 - 8.1 g/dL Final  . Albumin 04/06/2016 2.6* 3.5 - 5.0 g/dL Final  . AST 04/06/2016 41  15 - 41 U/L Final  . ALT 04/06/2016 25  14 - 54 U/L Final  . Alkaline Phosphatase 04/06/2016 65  38 - 126 U/L Final  . Total Bilirubin 04/06/2016 0.8  0.3 - 1.2 mg/dL Final  . GFR calc non Af Amer 04/06/2016 33* >60 mL/min Final  . GFR calc Af Amer 04/06/2016 38* >60 mL/min Final   Comment: (NOTE) The eGFR has been calculated using the CKD EPI equation. This calculation has not been validated in all clinical situations. eGFR's persistently <60 mL/min signify possible Chronic  Kidney Disease.   . Anion gap 04/06/2016 6  5 - 15 Final  . Lactic Acid, Venous 04/06/2016 1.79  0.5 - 1.9 mmol/L Final  . Specimen Description 04/11/2016 BLOOD PORTA CATH   Final  . Special Requests 04/11/2016 BOTTLES DRAWN AEROBIC AND ANAEROBIC 5CC   Final  . Culture 04/11/2016    Final                   Value:NO GROWTH 5 DAYS Performed at [facility]   . Report Status 04/11/2016 04/11/2016 FINAL   Final  . Specimen Description 04/11/2016 BLOOD LEFT HAND   Final  . Special Requests 04/11/2016 BOTTLES DRAWN AEROBIC AND ANAEROBIC 5CC   Final  . Culture 04/11/2016    Final                   Value:NO GROWTH 5 DAYS Performed at [facility]   . Report Status 04/11/2016 04/11/2016 FINAL   Final  . Lactic Acid, Venous 04/06/2016 2.09* 0.5 - 1.9 mmol/L Final  . Comment 04/06/2016 NOTIFIED PHYSICIAN   Final  . Sodium 04/07/2016 138  135 - 145 mmol/L Final  . Potassium 04/07/2016 4.1  3.5 - 5.1 mmol/L Final  . Chloride  04/07/2016 105  101 - 111 mmol/L Final  . CO2 04/07/2016 27  22 - 32 mmol/L Final  . Glucose, Bld 04/07/2016 238* 65 - 99 mg/dL Final  . BUN 04/07/2016 30* 6 - 20 mg/dL Final  . Creatinine, Ser 04/07/2016 1.64* 0.44 - 1.00 mg/dL Final  . Calcium 04/07/2016 8.4* 8.9 - 10.3 mg/dL Final  . Total Protein 04/07/2016 5.0* 6.5 - 8.1 g/dL Final  . Albumin 04/07/2016 2.3* 3.5 - 5.0 g/dL Final  . AST 04/07/2016 35  15 - 41 U/L Final  . ALT 04/07/2016 22  14 - 54 U/L Final  . Alkaline Phosphatase 04/07/2016 53  38 - 126 U/L Final  . Total Bilirubin 04/07/2016 1.3* 0.3 - 1.2 mg/dL Final  . GFR calc non Af Amer 04/07/2016 31* >60 mL/min Final  . GFR calc Af Amer 04/07/2016 36* >60 mL/min Final   Comment: (NOTE) The eGFR has been calculated using the CKD EPI equation. This calculation has not been validated in all clinical situations. eGFR's persistently <60 mL/min signify possible Chronic Kidney Disease.   . Anion gap 04/07/2016 6  5 - 15 Final  . WBC 04/07/2016  17.6* 4.0 - 10.5 K/uL Final  . RBC 04/07/2016 2.60* 3.87 - 5.11 MIL/uL Final  . Hemoglobin 04/07/2016 7.9* 12.0 - 15.0 g/dL Final  . HCT 04/07/2016 25.4* 36.0 - 46.0 % Final  . MCV 04/07/2016 97.7  78.0 - 100.0 fL Final  . MCH 04/07/2016 30.4  26.0 - 34.0 pg Final  . MCHC 04/07/2016 31.1  30.0 - 36.0 g/dL Final  . RDW 04/07/2016 15.6* 11.5 - 15.5 % Final  . Platelets 04/07/2016 145* 150 - 400 K/uL Final  . Glucose-Capillary 04/06/2016 60* 65 - 99 mg/dL Final  . Comment 1 04/06/2016 Notify RN   Final  . Comment 2 04/06/2016 Document in Chart   Final  . Glucose-Capillary 04/06/2016 73  65 - 99 mg/dL Final  . Glucose-Capillary 04/07/2016 165* 65 - 99 mg/dL Final  . Glucose-Capillary 04/07/2016 232* 65 - 99 mg/dL Final  . Vancomycin Tr 04/07/2016 16  15 - 20 ug/mL Final  . Glucose-Capillary 04/07/2016 63* 65 - 99 mg/dL Final  . Glucose-Capillary 04/07/2016 70  65 - 99 mg/dL Final  . Lactic Acid, Venous 04/07/2016 1.9  0.5 - 1.9 mmol/L Final  . WBC 04/08/2016 41.9* 4.0 - 10.5 K/uL Final  . RBC 04/08/2016 2.76* 3.87 - 5.11 MIL/uL Final  . Hemoglobin 04/08/2016 8.4* 12.0 - 15.0 g/dL Final  . HCT 04/08/2016 26.9* 36.0 - 46.0 % Final  . MCV 04/08/2016 97.5  78.0 - 100.0 fL Final  . MCH 04/08/2016 30.4  26.0 - 34.0 pg Final  . MCHC 04/08/2016 31.2  30.0 - 36.0 g/dL Final  . RDW 04/08/2016 15.8* 11.5 - 15.5 % Final  . Platelets 04/08/2016 202  150 - 400 K/uL Final  . Sodium 04/08/2016 141  135 - 145 mmol/L Final  . Potassium 04/08/2016 4.0  3.5 - 5.1 mmol/L Final  . Chloride 04/08/2016 106  101 - 111 mmol/L Final  . CO2 04/08/2016 28  22 - 32 mmol/L Final  . Glucose, Bld 04/08/2016 158* 65 - 99 mg/dL Final  . BUN 04/08/2016 28* 6 - 20 mg/dL Final  . Creatinine, Ser 04/08/2016 1.71* 0.44 - 1.00 mg/dL Final  . Calcium 04/08/2016 8.6* 8.9 - 10.3 mg/dL Final  . GFR calc non Af Amer 04/08/2016 29* >60 mL/min Final  . GFR calc Af Amer 04/08/2016 34* >60 mL/min Final  Comment: (NOTE) The eGFR  has been calculated using the CKD EPI equation. This calculation has not been validated in all clinical situations. eGFR's persistently <60 mL/min signify possible Chronic Kidney Disease.   . Anion gap 04/08/2016 7  5 - 15 Final  . Glucose-Capillary 04/07/2016 171* 65 - 99 mg/dL Final  . Glucose-Capillary 04/08/2016 138* 65 - 99 mg/dL Final  . Troponin I 04/08/2016 0.32* <0.03 ng/mL Final   Comment: CRITICAL RESULT CALLED TO, READ BACK BY AND VERIFIED WITH: K PHILLIPS AT 0753 ON 10.29.2017 BY NBROOKS   . Troponin I 04/08/2016 0.38* <0.03 ng/mL Final  . Troponin I 04/08/2016 0.45* <0.03 ng/mL Final   Comment: CRITICAL RESULT CALLED TO, READ BACK BY AND VERIFIED WITH: PHILLIPS,K RN AT 1900 ON 10.29.17 BY EPPERSON,S   . Weight 04/08/2016 3424  oz Final  . Height 04/08/2016 65  in Final  . BP 04/08/2016 114/54  mmHg Final  . TSH 04/08/2016 3.355  0.350 - 4.500 uIU/mL Final  . D-Dimer, Quant 04/08/2016 1.86* 0.00 - 0.50 ug/mL-FEU Final   Comment: (NOTE) At the manufacturer cut-off of 0.50 ug/mL FEU, this assay has been documented to exclude PE with a sensitivity and negative predictive value of 97 to 99%.  At this time, this assay has not been approved by the FDA to exclude DVT/VTE. Results should be correlated with clinical presentation.   . Glucose-Capillary 04/08/2016 157* 65 - 99 mg/dL Final  . Neutrophils Relative % 04/08/2016 94  % Final  . Lymphocytes Relative 04/08/2016 2  % Final  . Monocytes Relative 04/08/2016 4  % Final  . Eosinophils Relative 04/08/2016 0  % Final  . Basophils Relative 04/08/2016 0  % Final  . Neutro Abs 04/08/2016 39.4* 1.7 - 7.7 K/uL Final  . Lymphs Abs 04/08/2016 0.8  0.7 - 4.0 K/uL Final  . Monocytes Absolute 04/08/2016 1.7* 0.1 - 1.0 K/uL Final  . Eosinophils Absolute 04/08/2016 0.0  0.0 - 0.7 K/uL Final  . Basophils Absolute 04/08/2016 0.0  0.0 - 0.1 K/uL Final  . RBC Morphology 04/08/2016 POLYCHROMASIA PRESENT   Final  . WBC Morphology  04/08/2016 MILD LEFT SHIFT (1-5% METAS, OCC MYELO, OCC BANDS)   Final   Comment: TOXIC GRANULATION SPECIMEN CHECKED FOR CLOTS PLATELET COUNT CONFIRMED BY SMEAR   . Glucose-Capillary 04/08/2016 249* 65 - 99 mg/dL Final  . WBC 04/09/2016 54.9* 4.0 - 10.5 K/uL Final   Comment: CRITICAL RESULT CALLED TO, READ BACK BY AND VERIFIED WITHRuben Im RN [phone] 04/09/16 A NAVARRO   . RBC 04/09/2016 2.67* 3.87 - 5.11 MIL/uL Final  . Hemoglobin 04/09/2016 8.1* 12.0 - 15.0 g/dL Final  . HCT 04/09/2016 26.4* 36.0 - 46.0 % Final  . MCV 04/09/2016 98.9  78.0 - 100.0 fL Final  . MCH 04/09/2016 30.3  26.0 - 34.0 pg Final  . MCHC 04/09/2016 30.7  30.0 - 36.0 g/dL Final  . RDW 04/09/2016 16.2* 11.5 - 15.5 % Final  . Platelets 04/09/2016 209  150 - 400 K/uL Final  . Sodium 04/09/2016 140  135 - 145 mmol/L Final  . Potassium 04/09/2016 3.6  3.5 - 5.1 mmol/L Final  . Chloride 04/09/2016 105  101 - 111 mmol/L Final  . CO2 04/09/2016 28  22 - 32 mmol/L Final  . Glucose, Bld 04/09/2016 176* 65 - 99 mg/dL Final  . BUN 04/09/2016 26* 6 - 20 mg/dL Final  . Creatinine, Ser 04/09/2016 1.70* 0.44 - 1.00 mg/dL Final  . Calcium 04/09/2016 8.5* 8.9 -  10.3 mg/dL Final  . GFR calc non Af Amer 04/09/2016 29* >60 mL/min Final  . GFR calc Af Amer 04/09/2016 34* >60 mL/min Final   Comment: (NOTE) The eGFR has been calculated using the CKD EPI equation. This calculation has not been validated in all clinical situations. eGFR's persistently <60 mL/min signify possible Chronic Kidney Disease.   . Anion gap 04/09/2016 7  5 - 15 Final  . Glucose-Capillary 04/08/2016 167* 65 - 99 mg/dL Final  . Glucose-Capillary 04/08/2016 189* 65 - 99 mg/dL Final  . Glucose-Capillary 04/09/2016 178* 65 - 99 mg/dL Final  . Glucose-Capillary 04/09/2016 258* 65 - 99 mg/dL Final  . Glucose-Capillary 04/09/2016 172* 65 - 99 mg/dL Final  . Glucose-Capillary 04/09/2016 155* 65 - 99 mg/dL Final  . Glucose-Capillary 04/10/2016 167* 65 - 99 mg/dL  Final  . Comment 1 04/10/2016 Notify RN   Final  . Comment 2 04/10/2016 Document in Chart   Final  . WBC 04/10/2016 41.7* 4.0 - 10.5 K/uL Final   Comment: WHITE COUNT CONFIRMED ON SMEAR ADJUSTED FOR NUCLEATED RBC'S   . RBC 04/10/2016 2.55* 3.87 - 5.11 MIL/uL Final  . Hemoglobin 04/10/2016 7.8* 12.0 - 15.0 g/dL Final  . HCT 04/10/2016 25.0* 36.0 - 46.0 % Final  . MCV 04/10/2016 98.0  78.0 - 100.0 fL Final  . MCH 04/10/2016 30.6  26.0 - 34.0 pg Final  . MCHC 04/10/2016 31.2  30.0 - 36.0 g/dL Final  . RDW 04/10/2016 16.3* 11.5 - 15.5 % Final  . Platelets 04/10/2016 188  150 - 400 K/uL Final   Comment: SPECIMEN CHECKED FOR CLOTS PLATELET COUNT CONFIRMED BY SMEAR   . Neutrophils Relative % 04/10/2016 92  % Final  . Lymphocytes Relative 04/10/2016 3  % Final  . Monocytes Relative 04/10/2016 5  % Final  . Eosinophils Relative 04/10/2016 0  % Final  . Basophils Relative 04/10/2016 0  % Final  . nRBC 04/10/2016 1* 0 /100 WBC Final  . Neutro Abs 04/10/2016 38.3* 1.7 - 7.7 K/uL Final  . Lymphs Abs 04/10/2016 1.3  0.7 - 4.0 K/uL Final  . Monocytes Absolute 04/10/2016 2.1* 0.1 - 1.0 K/uL Final  . Eosinophils Absolute 04/10/2016 0.0  0.0 - 0.7 K/uL Final  . Basophils Absolute 04/10/2016 0.0  0.0 - 0.1 K/uL Final  . RBC Morphology 04/10/2016 POLYCHROMASIA PRESENT   Final   Comment: BASOPHILIC STIPPLING RARE NRBCs   . WBC Morphology 04/10/2016 TOXIC GRANULATION   Final   Comment: DOHLE BODIES MILD LEFT SHIFT (1-5% METAS, OCC MYELO, OCC BANDS)   . Smear Review 04/10/2016 LARGE PLATELETS PRESENT   Final    RADIOGRAPHIC STUDIES: No results found.  ASSESSMENT/PLAN:    Cellulitis of left leg Patient into the cancer Center today for recheck of her lower extremity cellulitis.  She was admitted to the hospital for treatment of the cellulitis on 04/06/2016; and was released from the hospital on 04/10/2016.  She states that she completed the Keflex antibiotics that she was given upon discharge,  just yesterday.  She denies any recent fevers or chills.  She states that her legs have greatly improved; and she can now ambulate with no pain.  Exam today reveals mild erythema to the anterior lower leg portion; with a trace of erythema only to the right lower extremity anterior portion.  Patient has no erythema or edema to her feet.  Whatsoever.  Also, patient was afebrile today.  Patient will be given a prescription refill of the Keflex to  take twice daily until she returns for follow-up visit with Dr. Lindi Adie on Friday, 04/27/2016.  Also, patient was advised to call/return or go directly to the emergency department for any worsening symptoms whatsoever.  Breast cancer of upper-inner quadrant of right female breast [facility]) Patient into the [facility] today for recheck of her lower extremity cellulitis.  She was admitted to the hospital for treatment of the cellulitis on 04/06/2016; and was released from the hospital on 04/10/2016.  She states that she completed the Keflex antibiotics that she was given upon discharge, just yesterday.  She denies any recent fevers or chills.  She states that her legs have greatly improved; and she can now ambulate with no pain.  Exam today reveals mild erythema to the anterior lower leg portion; with a trace of erythema only to the right lower extremity anterior portion.  Patient has no erythema or edema to her feet.  Whatsoever.  Also, patient was afebrile today.  Patient received cycle 3 of her Taxotere/cyclophosphamide chemotherapy on 03/30/2016.  Decision was made to hold any further chemotherapy after this last cycle.  Per Dr. Elease Etienne will have all appointments canceled for this coming Friday, 04/20/2016; and will instead.  Return for labs and a follow-up appointment with Dr. Lindi Adie on Friday, 04/27/2016.  Patient will be given a prescription refill of the Keflex to take twice daily until she returns for follow-up visit with Dr. Lindi Adie on Friday,  04/27/2016.  Also, patient was advised to call/return or go directly to the emergency department for any worsening symptoms whatsoever.   Patient stated understanding of all instructions; and was in agreement with this plan of care. The patient knows to call the clinic with any problems, questions or concerns.   Total time spent with patient was 10 minutes;  with greater than 75 percent of that time spent in face to face counseling regarding patient's symptoms,  and coordination of care and follow up.  Disclaimer:This dictation was prepared with Dragon/digital dictation along with [employer]. Any transcriptional errors that result from this process are unintentional.  Drue Second, NP 04/16/2016

## 2016-04-17 ENCOUNTER — Telehealth: Payer: Self-pay | Admitting: *Deleted

## 2016-04-17 NOTE — Telephone Encounter (Signed)
Cancel chemo and lab on 11/10. Schedule lab appt with Dr. Lindi Adie on 11/17. Confirmed appt date and time. Denies further needs or questions.

## 2016-04-19 NOTE — Discharge Summary (Addendum)
Physician Discharge Summary  Hannah [name] GLO:[id number] DOB: Apr 23, 1946 DOA: 04/06/2016  PCP: Nicola Girt, DO  Admit date: 04/06/2016 Discharge date: 04/10/2016  Admitted From: Home.  Disposition:  HOme.   Recommendations for Outpatient Follow-up:  1. Follow up with PCP in 1-2 weeks 2. Please obtain BMP/CBC in one week 3. Please follow up  With oncology as recommended.  4. Please follow up with cardiology for outpatient stress test.   Home Health:yes   Discharge Condition:stable.  CODE STATUS:full code.  Diet recommendation: Heart Healthy   Brief/Interim Summary: Hannah Korf Powersis a 70 y.o.femalewith history with history of anemia, CHF, coronary artery disease, hypertension, breast cancer currently undergoing chemotherapy, presents to emergency department with complaint of left leg pain, redness, swelling. She was admitted for cellulitis of the lower extremities.  Meanwhile during her hospitalization, she went in to afib with RVR several times and back to NSR with cardizem. Cardiology consulted and recommendations given.   Discharge Diagnoses:  Principal Problem:   Cellulitis of left leg Active Problems:   Atrial fibrillation (HCC)   Hypertension   CHF (congestive heart failure) (HCC)   Nonischemic cardiomyopathy (HCC)   Diabetes mellitus, type 2 (HCC)   S/P aortic valve replacement   CAD (coronary artery disease)   Atrial tachycardia (HCC)   Unstable angina (HCC)   CKD (chronic kidney disease)   Chronic diastolic CHF (congestive heart failure) (HCC)   Breast cancer of upper-inner quadrant of right female breast ([place])   Chemotherapy induced nausea and vomiting   GERD (gastroesophageal reflux disease)   Chemotherapy-induced thrombocytopenia     Bilateral lower extremity cellulitis: Started on IV vancomycin and cefepime,  Elevate the legs. US duplex negative for DVT.  Repeat lactic acid ordered and normalized.  Wound care.  Improving erythema and  tenderness.  Discharged her home on oral antibiotics to complete the course.     IDDM: CBG (last 3)   Recent Labs (last 2 labs)    Recent Labs  04/08/16 2158 04/09/16 0737 04/09/16 1138  GLUCAP 189* 178* 258*      Resume SSI, stopped the premeal coverage for low cbgs.     Chronic atrial fibrillation: Rate better controlled today. Cardiology consulted and plan to change  Metoprolol 100 mg daily to to 100 mg bid  Echo ordered, not significant.  Slightly elevated troponins, suspect from demand ischemia. She currently denies any chest pain or sob.  EKG shows some non specific st t wave changes, recommend outpatient stress test, when she is more stable.    CAD: No chest pain.   NICM; Chronic diastolic CHF: Appears to be compensated.   Elevated D DIMER: V/Q scan negative for PE.   Leukocytosis: Probably from neulasta?  No further intervention. Monitor as outpatient with oncology.    Discharge Instructions  Discharge Instructions    Discharge instructions    Complete by:  As directed    Please follow up with oncology as recommended.  Please follow up with cardiology as outpatient for a stress test in 2 to 4 weeks.       Medication List    TAKE these medications   ACCU-CHEK AVIVA PLUS test strip Generic drug:  glucose blood   alendronate 70 MG tablet Commonly known as:  FOSAMAX Take 70 mg by mouth once a week. Take with a full glass of water on an empty stomach.   aspirin 81 MG EC tablet Take 1 tablet (81 mg total) by mouth daily.   budesonide-formoterol 160-4.5  MCG/ACT inhaler Commonly known as:  SYMBICORT INHALE TWO PUFFS INTO LUNGS TWICE DAILY   bumetanide 1 MG tablet Commonly known as:  BUMEX Take 1 tablet (1 mg total) by mouth daily.   clopidogrel 75 MG tablet Commonly known as:  PLAVIX Take 75 mg by mouth daily.   docusate sodium 100 MG capsule Commonly known as:  COLACE Take 1 capsule (100 mg total) by mouth daily as  needed for mild constipation.   gabapentin 300 MG capsule Commonly known as:  NEURONTIN Take 1 capsule (300 mg total) by mouth 3 (three) times daily. What changed:  Another medication with the same name was removed. Continue taking this medication, and follow the directions you see here.   HUMALOG 100 UNIT/ML injection Generic drug:  insulin lispro Inject 76 units daily with  V-GO pump   lidocaine-prilocaine cream Commonly known as:  EMLA Apply to affected area once   loratadine 10 MG tablet Commonly known as:  CLARITIN Take 10 mg by mouth daily.   LORazepam 2 MG tablet Commonly known as:  ATIVAN Take 0.5 tablets (1 mg total) by mouth at bedtime. What changed:  Another medication with the same name was removed. Continue taking this medication, and follow the directions you see here.   metoprolol succinate 100 MG 24 hr tablet Commonly known as:  TOPROL-XL Take 1 tablet (100 mg total) by mouth 2 (two) times daily. Take with or immediately following a meal. What changed:  See the new instructions.   omeprazole 20 MG tablet Commonly known as:  PRILOSEC OTC Take 20 mg by mouth daily as needed (heartburn).   ondansetron 8 MG tablet Commonly known as:  ZOFRAN TAKE 1 TABLET BY MOUTH 2  TIMES DAILY AS NEEDED FOR  REFRACTORY NAUSEA /  VOMITING. START ON DAY 3  AFTER CHEMO.   prochlorperazine 10 MG tablet Commonly known as:  COMPAZINE Take 1 tablet (10 mg total) by mouth every 6 (six) hours as needed (Nausea or vomiting).   rosuvastatin 40 MG tablet Commonly known as:  CRESTOR TAKE ONE TABLET BY MOUTH ONCE DAILY   sucralfate 1 GM/10ML suspension Commonly known as:  CARAFATE Take 10 mLs (1 g total) by mouth 4 (four) times daily -  with meals and at bedtime.   TraMADol HCl 200 MG Cp24 Take 1 tablet by mouth daily as needed (for pain).   TRULICITY 0.93 OI/7.1IW Sopn Generic drug:  Dulaglutide Inject 0.75 mg into the skin every Wednesday.   ZETIA 10 MG tablet Generic drug:   ezetimibe Take 1 tablet by mouth  daily       Allergies  Allergen Reactions  . Amoxicillin Rash    Has patient had a PCN reaction causing immediate rash, facial/tongue/throat swelling, SOB or lightheadedness with hypotension: No Has patient had a PCN reaction causing severe rash involving mucus membranes or skin necrosis: Yes Has patient had a PCN reaction that required hospitalization No Has patient had a PCN reaction occurring within the last 10 years: No If all of the above answers are "NO", then may proceed with Cephalosporin use.   . Imdur [Isosorbide Dinitrate] Other (See Comments)    Headache/severe hypotension/Syncope  . Latex Itching  . Tetracycline Rash    Consultations:  Cardiology  oncology   Procedures/Studies: Dg Chest 2 View  Result Date: 04/09/2016 CLINICAL DATA:  Chronic diastolic congestive heart failure. Breast cancer undergoing chemotherapy. Atrial a arrhythmia. EXAM: CHEST  2 VIEW COMPARISON:  01/18/2016 FINDINGS: Previous median sternotomy and CABG. Previous  aortic valve replacement. Power port inserted from a left subclavian approach with the tip in the SVC above right atrium. The heart is enlarged. There is aortic atherosclerosis. There is pulmonary venous hypertension without evidence of edema. There are small effusions in the posterior costophrenic angles. No acute bone finding. Old right humeral head fracture. IMPRESSION: Pulmonary venous hypertension and small effusions in the posterior costophrenic angle, consistent with low level congestive heart failure. Electronically Signed   By: Nelson Chimes M.D.   On: 04/09/2016 15:48   Nm Pulmonary Perf And Vent  Result Date: 04/09/2016 CLINICAL DATA:  Shortness of breath. Tachycardia. Previous history of pulmonary embolism. EXAM: NUCLEAR MEDICINE VENTILATION - PERFUSION LUNG SCAN TECHNIQUE: Ventilation images were obtained in multiple projections using inhaled aerosol Tc-78m DTPA. Perfusion images were  obtained in multiple projections after intravenous injection of Tc-30m MAA. RADIOPHARMACEUTICALS:  30.2 mCi Technetium-18m DTPA aerosol inhalation and 4.2 mCi Technetium-61m MAA IV COMPARISON:  Chest radiograph also performed today. FINDINGS: Ventilation: No focal ventilation defect. Perfusion: No wedge shaped peripheral perfusion defects to suggest acute pulmonary embolism. IMPRESSION: No evidence of acute pulmonary embolism. Electronically Signed   By: Earle Gell M.D.   On: 04/09/2016 15:36    Echocardiogram.   Subjective: No new complaints.   Discharge Exam: Vitals:   04/09/16 2147 04/10/16 0508  BP: (!) 112/45 (!) 119/45  Pulse: 86 85  Resp: 20 20  Temp: 98.5 F (36.9 C) 97.5 F (36.4 C)   Vitals:   04/09/16 1724 04/09/16 2147 04/10/16 0508 04/10/16 0836  BP: 117/64 (!) 112/45 (!) 119/45   Pulse: 82 86 85   Resp:  20 20   Temp:  98.5 F (36.9 C) 97.5 F (36.4 C)   TempSrc:  Oral Oral   SpO2:  96% 99% 98%  Weight:      Height:        General: Pt is alert, awake, not in acute distress Cardiovascular: RRR, S1/S2 +, no rubs, no gallops Respiratory: CTA bilaterally, no wheezing, no rhonchi Abdominal: Soft, NT, ND, bowel sounds + Extremities: no edema, no cyanosis    The results of significant diagnostics from this hospitalization (including imaging, microbiology, ancillary and laboratory) are listed below for reference.     Microbiology: No results found for this or any previous visit (from the past 240 hour(s)).   Labs: BNP (last 3 results) No results for input(s): BNP in the last 8760 hours. Basic Metabolic Panel: No results for input(s): NA, K, CL, CO2, GLUCOSE, BUN, CREATININE, CALCIUM, MG, PHOS in the last 168 hours. Liver Function Tests: No results for input(s): AST, ALT, ALKPHOS, BILITOT, PROT, ALBUMIN in the last 168 hours. No results for input(s): LIPASE, AMYLASE in the last 168 hours. No results for input(s): AMMONIA in the last 168 hours. CBC: No  results for input(s): WBC, NEUTROABS, HGB, HCT, MCV, PLT in the last 168 hours. Cardiac Enzymes: No results for input(s): CKTOTAL, CKMB, CKMBINDEX, TROPONINI in the last 168 hours. BNP: Invalid input(s): POCBNP CBG: No results for input(s): GLUCAP in the last 168 hours. D-Dimer No results for input(s): DDIMER in the last 72 hours. Hgb A1c No results for input(s): HGBA1C in the last 72 hours. Lipid Profile No results for input(s): CHOL, HDL, LDLCALC, TRIG, CHOLHDL, LDLDIRECT in the last 72 hours. Thyroid function studies No results for input(s): TSH, T4TOTAL, T3FREE, THYROIDAB in the last 72 hours.  Invalid input(s): FREET3 Anemia work up No results for input(s): VITAMINB12, FOLATE, FERRITIN, TIBC, IRON, RETICCTPCT in the  last 72 hours. Urinalysis    Component Value Date/Time   COLORURINE YELLOW 12/07/2014 0016   APPEARANCEUR CLOUDY (A) 12/07/2014 0016   LABSPEC 1.020 02/24/2016 1119   PHURINE 6.0 02/24/2016 1119   PHURINE 7.5 12/07/2014 0016   GLUCOSEU Negative 02/24/2016 1119   HGBUR Large 02/24/2016 1119   HGBUR NEGATIVE 12/07/2014 0016   BILIRUBINUR Negative 02/24/2016 1119   KETONESUR Negative 02/24/2016 1119   KETONESUR 15 (A) 12/07/2014 0016   PROTEINUR 30 02/24/2016 1119   PROTEINUR 100 (A) 12/07/2014 0016   UROBILINOGEN 0.2 02/24/2016 1119   NITRITE Positive 02/24/2016 1119   NITRITE NEGATIVE 12/07/2014 0016   LEUKOCYTESUR Trace 02/24/2016 1119   Sepsis Labs Invalid input(s): PROCALCITONIN,  WBC,  LACTICIDVEN Microbiology No results found for this or any previous visit (from the past 240 hour(s)).   Time coordinating discharge: Over 30 minutes  SIGNED:   Hosie Poisson, MD  [facility] 04/19/2016, 8:56 AM Pager   If 7PM-7AM, please contact night-coverage [email or web address] Password [email or web address]

## 2016-04-20 ENCOUNTER — Other Ambulatory Visit: Payer: Medicare Other

## 2016-04-20 ENCOUNTER — Ambulatory Visit: Payer: Medicare Other

## 2016-04-20 ENCOUNTER — Telehealth: Payer: Self-pay | Admitting: *Deleted

## 2016-04-20 NOTE — Telephone Encounter (Signed)
TCT patient to follow on visit in [facility] on 04/16/16. Pt states her cellulitis in legs continues to improve, continues to keflex as ordered. She is aware of her appt with Dr. Lindi Adie next Friday, 04/27/16. She also states that her legs and feet continue to ache, making it difficult to get around. Advised patient to discuss with Dr. Lindi Adie at her next appt. Pt voiced understanding.

## 2016-04-25 ENCOUNTER — Other Ambulatory Visit: Payer: Self-pay | Admitting: Acute Care

## 2016-04-25 ENCOUNTER — Encounter: Payer: Self-pay | Admitting: Acute Care

## 2016-04-25 ENCOUNTER — Ambulatory Visit (INDEPENDENT_AMBULATORY_CARE_PROVIDER_SITE_OTHER): Payer: Medicare Other | Admitting: Acute Care

## 2016-04-25 ENCOUNTER — Ambulatory Visit (INDEPENDENT_AMBULATORY_CARE_PROVIDER_SITE_OTHER)
Admission: RE | Admit: 2016-04-25 | Discharge: 2016-04-25 | Disposition: A | Discharge status: Home / Self Care | Payer: Medicare Other | Source: Ambulatory Visit | Attending: Acute Care | Admitting: Acute Care

## 2016-04-25 VITALS — BP 126/62 | HR 86 | Temp 97.9°F | Ht 65.0 in | Wt 221.0 lb

## 2016-04-25 DIAGNOSIS — R0602 Shortness of breath: Secondary | ICD-10-CM

## 2016-04-25 DIAGNOSIS — I5032 Chronic diastolic (congestive) heart failure: Secondary | ICD-10-CM

## 2016-04-25 DIAGNOSIS — J45909 Unspecified asthma, uncomplicated: Secondary | ICD-10-CM

## 2016-04-25 DIAGNOSIS — J45998 Other asthma: Secondary | ICD-10-CM | POA: Diagnosis not present

## 2016-04-25 NOTE — Assessment & Plan Note (Signed)
Orthopnea with 7 pound weight gain over last 7 days Plan: Take additional lasix 20 mg tonight and tomorrow night if you have not been seen by cards. We will schedule follow up with cards this week. Will need BNP and HGB check when seen by cards ( Lab was closed at time pt was seen tonight) Low sodium diet. Weight yourself daily after first void in the morning and keep track of weight.

## 2016-04-25 NOTE — Progress Notes (Signed)
History of Present Illness Hannah [name] is a 70 y.o. female with Asthma, COPD, CHF, chronic rhinitis, and breast cancer ( diagnosed 12/2015) followed by Dr. Lamonte Sakai  HPI 70 yo never smoker, hx of CAD/CABG, aortic valve replacement, A Fib. Also with hx of childhood asthma. She was diagnosed with adult asthma in her 67's in setting wheeze, SOB, cough that wouldn't go away. Was on prn SABA for a while, started on Symbicort early 2009. Had PFT's done in August 2009 and 2010 that showed mixed disease. In 10/10 FEV1 1.01L. Averages Prednisone about 3 - 4 x a year. Never hospitalized or on MV due to breathing. Amiodarone stopped late 2010. Diagnosed with breast cancer 12/2015, underwent chemo through 03/30/2016, and will start radiation treatment 05/2016.Chemo regimen was Taxoterel / Cyclophosphamide.  04/25/2016 Acute OV: Pt. Presents to the office today with 1 day history of  Orthopnea and chest tightness.She states this is not chest pain. She has had a recent hospitalization ( (10/27-10/31)  for cellulitis, and has been treated with Keflex twice, most recently 11/6-11/16.(Last dose is tomorrow) .She states she was uncomfortable laying flat last night, and had a sense of being unable to take a deep breath.She does have a nebulizer and albuterol nebs,but she has not used a nebulizer treatment as she states she didn't think to do it. She does not appear to be in distress in the office today, but states she is being proactive. She denies any calf pain.No leg swelling , no recent air or car travel. Saturations on room air are 98%, HR is 86. She denies fever, chest pain, or hemoptysis. She has noted that her weight is up about 7 pounds in the last week.She is scheduled to see Dr. Stanford Breed 05/17/2016, but we will see if we can get her in to see an APP before then.She states that her oncologist has made changes to her diuretic. She was taking Lasix 60 mg daily per cards. She was changed to Bumex 1 mg daily for  worsening swelling and weight gain per oncology. She did feel better after xopenex neb in the office today. CXR as below.Lab was closed and we were unable to get BNP or HGB.If she is unable to get in to see cards we will have her come in for lab work in the morning.  Tests CXR 04/25/2016: FINDINGS: Lungs are hyperexpanded. Stable appearance tiny right pleural effusion. Left pleural effusions seen previously has resolved. The cardio pericardial silhouette is enlarged. Interstitial markings are diffusely coarsened with chronic features. Left Port-A-Cath tip overlies the distal SVC. Patient is status post CABG. Bones are diffusely demineralized.  IMPRESSION: Cardiomegaly with emphysema.  Tiny right pleural effusion.  Past medical hx Past Medical History:  Diagnosis Date  . Abnormally small mouth   . Anemia   . Bilateral carotid artery stenosis    Bilateral ICA 40-59%/  >50% LECA  . Breast cancer ([place])   . Breast cancer of upper-inner quadrant of right female breast ([place]) 12/16/2015  . Bruises easily   . CAD (coronary artery disease) cardiologist-  dr Aundra Dubin   Remote CABG in 1998 with redo in 2009  . CHF (congestive heart failure) (HCC)    EF is low normal at 50 to 55% per echo in Jan. 2011  . Dyspnea   . GERD (gastroesophageal reflux disease)   . Hearing loss of right ear   . History of non-ST elevation myocardial infarction (NSTEMI)    Sept 2014--  thought to be type II  HTN w/ LHC without infarct related artery and patent grafts  . Hyperlipidemia   . Hypertension   . Iron deficiency anemia   . Moderate persistent asthma    pulmologist-  Dr. Malvin Johns  . Nausea & vomiting    related to stone  . Nonischemic cardiomyopathy ([place])   . PAF (paroxysmal atrial fibrillation) ([place])   . PONV (postoperative nausea and vomiting)    pt states patch worked well before  . Psoriasis    right leg  . Renal calculus, right   . Renal insufficiency, mild   . S/P AVR    prosthesis valve  placement 2009 at same time re-do CABG  . 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 Type 2 diabetes mellitus ([place])    monitored by dr Dwyane Dee     Past surgical hx, Family hx, Social hx all reviewed.  Current Outpatient Prescriptions on File Prior to Visit  Medication Sig  . ACCU-CHEK AVIVA PLUS test strip   . alendronate (FOSAMAX) 70 MG tablet Take 70 mg by mouth once a week. Take with a full glass of water on an empty stomach.  Marland Kitchen aspirin EC 81 MG EC tablet Take 1 tablet (81 mg total) by mouth daily.  . budesonide-formoterol (SYMBICORT) 160-4.5 MCG/ACT inhaler INHALE TWO PUFFS INTO LUNGS TWICE DAILY  . bumetanide (BUMEX) 1 MG tablet Take 1 tablet (1 mg total) by mouth daily.  . cephALEXin (KEFLEX) 500 MG capsule Take 1 capsule (500 mg total) by mouth 2 (two) times daily.  . clopidogrel (PLAVIX) 75 MG tablet Take 75 mg by mouth daily.  Marland Kitchen docusate sodium (COLACE) 100 MG capsule Take 1 capsule (100 mg total) by mouth daily as needed for mild constipation.  . gabapentin (NEURONTIN) 300 MG capsule Take 1 capsule (300 mg total) by mouth 3 (three) times daily.  Marland Kitchen HUMALOG 100 UNIT/ML injection Inject 76 units daily with  V-GO pump  . lidocaine-prilocaine (EMLA) cream Apply to affected area once  . loratadine (CLARITIN) 10 MG tablet Take 10 mg by mouth daily.  Marland Kitchen LORazepam (ATIVAN) 2 MG tablet Take 0.5 tablets (1 mg total) by mouth at bedtime.  . metoprolol succinate (TOPROL-XL) 100 MG 24 hr tablet Take 1 tablet (100 mg total) by mouth 2 (two) times daily. Take with or immediately following a meal.  . omeprazole (PRILOSEC OTC) 20 MG tablet Take 20 mg by mouth daily as needed (heartburn).   . rosuvastatin (CRESTOR) 40 MG tablet TAKE ONE TABLET BY MOUTH ONCE DAILY  . TRULICITY 5.36 RW/4.3XV SOPN Inject 0.75 mg into the skin every Wednesday.   Marland Kitchen ZETIA 10 MG tablet Take 1 tablet by mouth  daily   No current facility-administered medications on file prior to visit.      Allergies  Allergen  Reactions  . Amoxicillin Rash    Has patient had a PCN reaction causing immediate rash, facial/tongue/throat swelling, SOB or lightheadedness with hypotension: No Has patient had a PCN reaction causing severe rash involving mucus membranes or skin necrosis: Yes Has patient had a PCN reaction that required hospitalization No Has patient had a PCN reaction occurring within the last 10 years: No If all of the above answers are "NO", then may proceed with Cephalosporin use.   . Imdur [Isosorbide Dinitrate] Other (See Comments)    Headache/severe hypotension/Syncope  . Latex Itching  . Tetracycline Rash    Review Of Systems:  Constitutional:   No  weight loss, night sweats,  Fevers, chills,  fatigue, or  Lassitude., 7 pound weight gain  HEENT:   No headaches,  Difficulty swallowing,  Tooth/dental problems, or  Sore throat,                No sneezing, itching, ear ache, nasal congestion, post nasal drip,   CV:  No chest pain,  + Orthopnea, No PND, + swelling in lower extremities, + anasarca, no dizziness, palpitations, syncope.   GI  No heartburn, indigestion, abdominal pain, nausea, vomiting, diarrhea, change in bowel habits, loss of appetite, bloody stools.   Resp: + shortness of breath with exertion less  at rest.  No excess mucus, no productive cough,  No non-productive cough,  No coughing up of blood.  No change in color of mucus.  No wheezing.  No chest wall deformity, + chest tightness  Skin: no rash or lesions.  GU: no dysuria, change in color of urine, no urgency or frequency.  No flank pain, no hematuria   MS:  No joint pain or swelling.  No decreased range of motion.  No back pain.  Psych:  No change in mood or affect. No depression or anxiety.  No memory loss.   Vital Signs BP 126/62 (BP Location: Left Arm, Cuff Size: Normal)   Pulse 86   Temp 97.9 F (36.6 C) (Oral)   Ht 5\' 5"  (1.651 m)   Wt 221 lb (100.2 kg)   SpO2 98%   BMI 36.78 kg/m    Physical  Exam:  General- No distress,  A&Ox3, plesant ENT: No sinus tenderness, TM clear, pale nasal mucosa, no oral exudate,no post nasal drip, no LAN Cardiac: S1, S2, regular rate and rhythm, no murmur Chest: No wheeze/ crackles per right base, no dullness; no accessory muscle use, no nasal flaring, no sternal retractions Abd.: Soft Non-tender Ext: No clubbing cyanosis,  2+ edema bilateral lower extremities Neuro:  normal strength Skin: No rashes, warm and dry Psych: normal mood and behavior   Assessment/Plan  Asthma Acute OV with c/o chest tightness and orthopnea Plan: We will do a CXR today We will give you a nebulizer treatment today. We will schedule an appointment at your cardiologist's office in the next day or so.  . Take an additional Lasix 20 mg tonight, and again tomorrow night if you have not see cardiology yet.. Use neb treatments as needed for chest tightness up to every 6 hours. Continue Symbicort daily as you have been doing. Follow up with Dr. Lamonte Sakai or NP in 1 month  For follow up  Please contact office for sooner follow up if symptoms do not improve or worsen or seek emergency care     Chronic diastolic CHF (congestive heart failure) ([place]) Orthopnea with 7 pound weight gain over last 7 days Plan: Take additional lasix 20 mg tonight and tomorrow night if you have not been seen by cards. We will schedule follow up with cards this week. Will need BNP and HGB check when seen by cards ( Lab was closed at time pt was seen tonight) Low sodium diet. Weight yourself daily after first void in the morning and keep track of weight.    Magdalen Spatz, NP 04/25/2016  5:55 PM

## 2016-04-25 NOTE — Assessment & Plan Note (Signed)
Acute OV with c/o chest tightness and orthopnea Plan: We will do a CXR today We will give you a nebulizer treatment today. We will schedule an appointment at your cardiologist's office in the next day or so.  . Take an additional Lasix 20 mg tonight, and again tomorrow night if you have not see cardiology yet.. Use neb treatments as needed for chest tightness up to every 6 hours. Continue Symbicort daily as you have been doing. Follow up with Dr. Lamonte Sakai or NP in 1 month  For follow up  Please contact office for sooner follow up if symptoms do not improve or worsen or seek emergency care

## 2016-04-25 NOTE — Patient Instructions (Addendum)
It is nice to meet you today We will do a CXR today We will give you a nebulizer treatment today. We will schedule an appointment at your cardiologist's office in the next day or so.  . Take an additional Lasix 20 mg tonight, and again tomorrow night if you have not see cardiology yet.. Use neb treatments as needed for chest tightness up to every 6 hours. Continue Symbicort daily as you have been doing. Follow up with Dr. Lamonte Sakai or NP in 1 month  For follow up  Please contact office for sooner follow up if symptoms do not improve or worsen or seek emergency care

## 2016-04-26 NOTE — Assessment & Plan Note (Signed)
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 Adjuvant chemotherapy with Taxotere and Cytoxan (4 cycles). Adriamycin is not being given because of her cardiac problems 2. Followed by adjuvant radiation 3 followed by adjuvant antiestrogen therapy (because a satellite tumor is weakly ER and PR positive). --------------------------------------------------------------------------------------------------------------------------------------------------------- Current treatment: Cycle 3 day 1Taxotere and Cytoxan  Chemotherapy toxicities: 1. Nausea and vomiting: We decreased the dosage of cycle 2 of chemotherapy 2. Dehydration due to diarrhea: We will give her 1 L of normal saline. We will also add Aloxi. 3.Severe fatigue: Marked improvement 4. Hypoalbuminemia: Patient is working very hard to eat more protein in the diet. 5. Manic episodes: Patient was sent to the emergency room last week and was prescribed Vistaril and trazodone. She does not want to take them anymore because her main problem is insomnia. I instructed her to double the Ativan. Patient is not on any dexamethasone.  6. Severe anemia: Due to chemotherapy will be monitored.  Reviewed labs  Monitoring closely for toxicities  Return to clinic in 3 weeks for cycle 5

## 2016-04-27 ENCOUNTER — Encounter: Payer: Self-pay | Admitting: *Deleted

## 2016-04-27 ENCOUNTER — Encounter: Payer: Self-pay | Admitting: Hematology and Oncology

## 2016-04-27 ENCOUNTER — Ambulatory Visit: Payer: Medicare Other

## 2016-04-27 ENCOUNTER — Ambulatory Visit (HOSPITAL_BASED_OUTPATIENT_CLINIC_OR_DEPARTMENT_OTHER): Payer: Medicare Other | Admitting: Hematology and Oncology

## 2016-04-27 ENCOUNTER — Telehealth: Payer: Self-pay | Admitting: Oncology

## 2016-04-27 ENCOUNTER — Other Ambulatory Visit (HOSPITAL_BASED_OUTPATIENT_CLINIC_OR_DEPARTMENT_OTHER): Payer: Medicare Other

## 2016-04-27 VITALS — BP 116/46 | HR 124 | Temp 98.1°F | Resp 18 | Ht 65.0 in | Wt 220.6 lb

## 2016-04-27 DIAGNOSIS — D6481 Anemia due to antineoplastic chemotherapy: Secondary | ICD-10-CM

## 2016-04-27 DIAGNOSIS — C50211 Malignant neoplasm of upper-inner quadrant of right female breast: Secondary | ICD-10-CM | POA: Diagnosis not present

## 2016-04-27 DIAGNOSIS — G620 Drug-induced polyneuropathy: Secondary | ICD-10-CM

## 2016-04-27 DIAGNOSIS — T451X5A Adverse effect of antineoplastic and immunosuppressive drugs, initial encounter: Secondary | ICD-10-CM | POA: Insufficient documentation

## 2016-04-27 DIAGNOSIS — Z170 Estrogen receptor positive status [ER+]: Secondary | ICD-10-CM

## 2016-04-27 DIAGNOSIS — Z95828 Presence of other vascular implants and grafts: Secondary | ICD-10-CM

## 2016-04-27 DIAGNOSIS — R530 Neoplastic (malignant) related fatigue: Secondary | ICD-10-CM

## 2016-04-27 DIAGNOSIS — R531 Weakness: Secondary | ICD-10-CM

## 2016-04-27 HISTORY — DX: Adverse effect of antineoplastic and immunosuppressive drugs, initial encounter: G62.0

## 2016-04-27 HISTORY — DX: Adverse effect of antineoplastic and immunosuppressive drugs, initial encounter: T45.1X5A

## 2016-04-27 LAB — CBC WITH DIFFERENTIAL/PLATELET
BASO%: 1.4 % (ref 0.0–2.0)
Basophils Absolute: 0.1 10*3/uL (ref 0.0–0.1)
EOS%: 10.2 % — ABNORMAL HIGH (ref 0.0–7.0)
Eosinophils Absolute: 0.7 10*3/uL — ABNORMAL HIGH (ref 0.0–0.5)
HCT: 27.3 % — ABNORMAL LOW (ref 34.8–46.6)
HGB: 8.5 g/dL — ABNORMAL LOW (ref 11.6–15.9)
LYMPH%: 15.3 % (ref 14.0–49.7)
MCH: 30.4 pg (ref 25.1–34.0)
MCHC: 31.1 g/dL — ABNORMAL LOW (ref 31.5–36.0)
MCV: 97.5 fL (ref 79.5–101.0)
MONO#: 0.9 10*3/uL (ref 0.1–0.9)
MONO%: 13.8 % (ref 0.0–14.0)
NEUT#: 3.8 10*3/uL (ref 1.5–6.5)
NEUT%: 59.3 % (ref 38.4–76.8)
Platelets: 136 10*3/uL — ABNORMAL LOW (ref 145–400)
RBC: 2.8 10*6/uL — ABNORMAL LOW (ref 3.70–5.45)
RDW: 17.4 % — ABNORMAL HIGH (ref 11.2–14.5)
WBC: 6.4 10*3/uL (ref 3.9–10.3)
lymph#: 1 10*3/uL (ref 0.9–3.3)
nRBC: 0 % (ref 0–0)

## 2016-04-27 LAB — COMPREHENSIVE METABOLIC PANEL
ALT: 24 U/L (ref 0–55)
AST: 28 U/L (ref 5–34)
Albumin: 2.7 g/dL — ABNORMAL LOW (ref 3.5–5.0)
Alkaline Phosphatase: 62 U/L (ref 40–150)
Anion Gap: 9 mEq/L (ref 3–11)
BUN: 29.5 mg/dL — ABNORMAL HIGH (ref 7.0–26.0)
CO2: 29 mEq/L (ref 22–29)
Calcium: 8.9 mg/dL (ref 8.4–10.4)
Chloride: 105 mEq/L (ref 98–109)
Creatinine: 1.3 mg/dL — ABNORMAL HIGH (ref 0.6–1.1)
EGFR: 42 mL/min/{1.73_m2} — ABNORMAL LOW (ref >90)
Glucose: 109 mg/dl (ref 70–140)
Potassium: 4.1 mEq/L (ref 3.5–5.1)
Sodium: 143 mEq/L (ref 136–145)
Total Bilirubin: 0.81 mg/dL (ref 0.20–1.20)
Total Protein: 6 g/dL — ABNORMAL LOW (ref 6.4–8.3)

## 2016-04-27 MED ORDER — SODIUM CHLORIDE 0.9 % IJ SOLN
10.0000 mL | INTRAMUSCULAR | Status: DC | PRN
Start: 2016-04-27 — End: 2016-04-27
  Administered 2016-04-27: 10 mL via INTRAVENOUS
  Filled 2016-04-27: qty 10

## 2016-04-27 MED ORDER — HEPARIN SOD (PORK) LOCK FLUSH 100 UNIT/ML IV SOLN
500.0000 [IU] | Freq: Once | INTRAVENOUS | Status: AC | PRN
  Administered 2016-04-27: 500 [IU] via INTRAVENOUS
  Filled 2016-04-27: qty 5

## 2016-04-27 NOTE — Telephone Encounter (Signed)
NO LOS/ORDER. BOTH [facility] AND [facility] WILL CONTACT PATIENT RE APPOINTMENTS.

## 2016-04-27 NOTE — Progress Notes (Signed)
Patient Care Team: Nicola Girt, DO as PCP - General Alphonsa Overall, MD as Consulting Physician (General Surgery) Nicholas Lose, MD as Consulting Physician (Hematology and Oncology) Gery Pray, MD as Consulting Physician (Radiation Oncology)  DIAGNOSIS:  Encounter Diagnoses  Name Primary?  . Malignant neoplasm of upper-inner quadrant of right breast in female, estrogen receptor positive ([place])   . Weakness generalized Yes    SUMMARY OF ONCOLOGIC HISTORY:   Breast cancer of upper-inner quadrant of right female breast ([place])   12/14/2015 Initial Diagnosis    Right breast biopsy 12:30 position: 2 masses, 1.6 cm mass: Invasive ductal carcinoma, grade 2, ER 0%, PR 0%, HER-2 negative, Ki-67 70%; satellite mass 8 mm: IDC grade 2 ER 5%, PR 5%, HER-2 negative, Ki-67 50%; T1cN0 stage IA clinical stage      01/18/2016 Surgery    Right lumpectomy: Multifocal IDC grade 3, 1.9 cm  ER 0%, PR 0%, HER-2 negative, Ki-67 70% and 0.8 cm satellite mass ER 5%, PR 2%, HER-2 negative, Ki-67 50%, high-grade DCIS, margins negative, 0/1 lymph nodes negative T1 cN0 stage IA      02/17/2016 - 04/06/2016 Anti-estrogen oral therapy    Adjuvant chemotherapy with Taxotere and Cytoxan3 stopped early due to neuropathy, recurrent cellulitis of legs      04/08/2016 - 04/23/2016 Hospital Admission    Hosp adm for cellulitis       CHIEF COMPLIANT: Follow-up after completion of chemotherapy  INTERVAL HISTORY: Hannah [name] is a 70 year old with above-mentioned history of right breast cancer underwent lumpectomy and received 3 cycles of adjuvant chemotherapy with Taxotere and Cytoxan. She could not receive the fourth cycle because of neuropathy as well as hospitalization for cellulitis. She has noticed excessive exfoliation of the skin since her recent hospitalization. The cellulitis has improved but the skin peeling is performed on both her feet as well as some on the hands. She was started on Neurontin for the  neuropathy and it appears to be helping her. Her biggest complaint is bilateral leg swelling in spite of taking Bumex. The swelling resolves at night but then through the day it gets worse. It causes stiffness in her legs making it difficult for her to do activity. She is also extremely fatigued. She is having difficulty with signing her name with her hands.  REVIEW OF SYSTEMS:   Constitutional: Denies fevers, chills or abnormal weight loss, severe fatigue Eyes: Denies blurriness of vision Ears, nose, mouth, throat, and face: Denies mucositis or sore throat Respiratory: Denies cough, dyspnea or wheezes Cardiovascular: Denies palpitation, chest discomfort Gastrointestinal:  Denies nausea, heartburn or change in bowel habits Skin: Denies abnormal skin rashes Lymphatics: Denies new lymphadenopathy or easy bruising Neurological: Grade 3 peripheral neuropathy Behavioral/Psych: Mood is stable, no new changes  Extremities: 3+ lower extremity edema All other systems were reviewed with the patient and are negative.  I have reviewed the past medical history, past surgical history, social history and family history with the patient and they are unchanged from previous note.  ALLERGIES:  is allergic to amoxicillin; imdur [isosorbide dinitrate]; latex; and tetracycline.  MEDICATIONS:  Current Outpatient Prescriptions  Medication Sig Dispense Refill  . ACCU-CHEK AVIVA PLUS test strip     . alendronate (FOSAMAX) 70 MG tablet Take 70 mg by mouth once a week. Take with a full glass of water on an empty stomach.    Marland Kitchen aspirin EC 81 MG EC tablet Take 1 tablet (81 mg total) by mouth daily.    Marland Kitchen  budesonide-formoterol (SYMBICORT) 160-4.5 MCG/ACT inhaler INHALE TWO PUFFS INTO LUNGS TWICE DAILY 1 Inhaler 11  . bumetanide (BUMEX) 1 MG tablet Take 1 tablet (1 mg total) by mouth daily. 30 tablet 2  . cephALEXin (KEFLEX) 500 MG capsule Take 1 capsule (500 mg total) by mouth 2 (two) times daily. 22 capsule 0  .  clopidogrel (PLAVIX) 75 MG tablet Take 75 mg by mouth daily.    Marland Kitchen docusate sodium (COLACE) 100 MG capsule Take 1 capsule (100 mg total) by mouth daily as needed for mild constipation. 10 capsule 0  . gabapentin (NEURONTIN) 300 MG capsule Take 1 capsule (300 mg total) by mouth 3 (three) times daily. 90 capsule 3  . HUMALOG 100 UNIT/ML injection Inject 76 units daily with  V-GO pump 70 mL 2  . lidocaine-prilocaine (EMLA) cream Apply to affected area once 30 g 3  . loratadine (CLARITIN) 10 MG tablet Take 10 mg by mouth daily.    Marland Kitchen LORazepam (ATIVAN) 2 MG tablet Take 0.5 tablets (1 mg total) by mouth at bedtime. 60 tablet 1  . metoprolol succinate (TOPROL-XL) 100 MG 24 hr tablet Take 1 tablet (100 mg total) by mouth 2 (two) times daily. Take with or immediately following a meal. 60 tablet 0  . omeprazole (PRILOSEC OTC) 20 MG tablet Take 20 mg by mouth daily as needed (heartburn).     . rosuvastatin (CRESTOR) 40 MG tablet TAKE ONE TABLET BY MOUTH ONCE DAILY 30 tablet 11  . TRULICITY 8.91 QX/4.5WT SOPN Inject 0.75 mg into the skin every Wednesday.     Marland Kitchen ZETIA 10 MG tablet Take 1 tablet by mouth  daily 90 tablet 2   No current facility-administered medications for this visit.     PHYSICAL EXAMINATION: ECOG PERFORMANCE STATUS: 3 - Symptomatic, >50% confined to bed  Vitals:   04/27/16 1417  BP: (!) 116/46  Pulse: (!) 124  Resp: 18  Temp: 98.1 F (36.7 C)   Filed Weights   04/27/16 1417  Weight: 220 lb 9.6 oz (100.1 kg)    GENERAL:alert, no distress and comfortable SKIN: skin color, texture, turgor are normal, no rashes or significant lesions EYES: normal, Conjunctiva are pink and non-injected, sclera clear OROPHARYNX:no exudate, no erythema and lips, buccal mucosa, and tongue normal  NECK: supple, thyroid normal size, non-tender, without nodularity LYMPH:  no palpable lymphadenopathy in the cervical, axillary or inguinal LUNGS: clear to auscultation and percussion with normal breathing  effort HEART: regular rate & rhythm and no murmurs and no lower extremity edema ABDOMEN:abdomen soft, non-tender and normal bowel sounds MUSCULOSKELETAL:no cyanosis of digits and no clubbing  NEURO: alert & oriented x 3 with fluent speech, Severe peripheral neuropathy grade 3 EXTREMITIES: 3+ lower extremity edema  LABORATORY DATA:  I have reviewed the data as listed   Chemistry      Component Value Date/Time   NA 143 04/27/2016 1352   K 4.1 04/27/2016 1352   CL 105 04/09/2016 0318   CO2 29 04/27/2016 1352   BUN 29.5 (H) 04/27/2016 1352   CREATININE 1.3 (H) 04/27/2016 1352      Component Value Date/Time   CALCIUM 8.9 04/27/2016 1352   ALKPHOS 62 04/27/2016 1352   AST 28 04/27/2016 1352   ALT 24 04/27/2016 1352   BILITOT 0.81 04/27/2016 1352       Lab Results  Component Value Date   WBC 6.4 04/27/2016   HGB 8.5 (L) 04/27/2016   HCT 27.3 (L) 04/27/2016   MCV 97.5 04/27/2016  PLT 136 (L) 04/27/2016   NEUTROABS 3.8 04/27/2016     ASSESSMENT & PLAN:  Breast cancer of upper-inner quadrant of right female breast ([place]) Right breast biopsy 12:30 position 12/14/2015: 2 masses, 1.6 cm mass: Invasive ductal carcinoma, grade 2, ER 0%, PR 0%, HER-2 negative, Ki-67 70%; satellite mass 8 mm: IDC grade 2 ER 5%, PR 5%, HER-2 negative, Ki-67 50%; T1cN0 stage IA clinical stage  Right lumpectomy 01/18/2016: Multifocal IDC grade 3, 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 Adjuvant chemotherapy with Taxotere and Cytoxan (3 cycles). Adriamycin is not being given because of her cardiac problems 2. Followed by adjuvant radiation 3 followed by adjuvant antiestrogen therapy (because a satellite tumor is weakly ER and PR  positive). --------------------------------------------------------------------------------------------------------------------------------------------------------- Current treatment:Completed 3 cyclesTaxotere and Cytoxan Patient could not complete the fourth cycle because of peripheral neuropathy along with cellulitis of extremities and severe fatigue.  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 Chemotherapy-induced severe fatigue Chemotherapy-induced anemia: Hemoglobin is 8.5.  Monitoring closely for toxicities  Return to clinic towards end of radiation.   Orders Placed This Encounter  Procedures  . Ambulatory referral to Physical Therapy    Referral Priority:   Routine    Referral Type:   Physical Medicine    Referral Reason:   Specialty Services Required    Requested Specialty:   Physical Therapy    Number of Visits Requested:   1   The patient has a good understanding of the overall plan. she agrees with it. she will call with any problems that may develop before the next visit here.   Rulon Eisenmenger, MD 04/27/16

## 2016-04-30 NOTE — Progress Notes (Signed)
CARDIOLOGY OFFICE NOTE  Date:  05/01/2016    Hannah [name] Date of Birth: [birth year] Medical Record #[id number]  PCP:  Nicola Girt, DO  Cardiologist:  Annabell Howells  Chief Complaint  Patient presents with  . Coronary Artery Disease    Follow up visit - seen for Dr. Aundra Dubin    History of Present Illness: Hannah [name] is a 70 y.o. female who presents today for an early visit back. Referred back from pulmonary. She is a former patient of Dr. Susa Simmonds. Seen for Dr. Aundra Dubin. She now follows with me.   She has a history of CAD s/p CABG, bioprosthetic AVR, CKD, paroxysmal atrial fibrillation presents for cardiology followup. She had her initial CABG in 1998, then had redo CABG in 2009 with bioprosthetic AVR. She does not remember when she last had documented atrial fibrillation, but she thinks it was a number of years ago, sounds peri-operative around the time of her redo CABG. She was never put on anticoagulation but had been on dronedarone.   In 9/14, she developed tachypalpitations and later chest pain. In the ER, SBP was in the 200s with elevated HR. Troponin was mildly elevated. Therefore, she had a cardiac cath showing patent grafts. She was thought to have had demand ischemia in the setting of hypertensive emergency + tachycardia. After discharge, she continued to have runs of tachypalpitations. A 3 week event monitor was put on her. This showed runs, sometimes long, of atrial tachycardia. She was started on Toprol XL when these runs were noted. No further palpitations (complete resolution of symptoms).   In 1/15, she was admitted to [facility] with atypical chest pain. She was ruled out for MI and Lexiscan Cardiolite showed no ischemia. She was started on Imdur and sent home. After discharge, she developed a daily headache and nausea. She did not eat or drink much due to the nausea. Shortly after this, she was standing in her grandson's pediatrician's  office and became severely lightheaded. She had to sit down. She came to the ER and was found to be orthostatic. She was given IV fluid and got better quickly. The Imdur was stopped and symptoms seem to have completely resolved.   Event monitor in 8/16 showed atrial tachycardia versus atypical atrial flutter. Toprol XL was increased to 100 mg bid. She saw Dr Lovena Le who thought that she had short runs of atrial tachycardia and not atrial flutter. Therefore, she has not been anticoagulated. She hvaing occasional brief palpitations but overall was felt to be doing better.   She saw Dr. Aundra Dubin back in April of 2017 - she was doing well.   Has been diagnosed with breast cancer since her last visit here. Has had surgery. Getting chemo/radiation - no Adriamycin due to her history of CHF.  I last saw her back a month ago - getting chemo. Overall seemed to be holding her onw.   Comes back today. Here with her daughter today. She has not really felt like driving. Having typical side effects from chemo.  Was admitted at the end of October with cellulitis. She had 3 cycles of Taxotere and Cytoxan and could not get her 4th due to severe fatigue, neuropathy and cellulitis.  She remains anemic. She is short of breath. The two are most certainly related. Her weight continues to climb. She feels terribly volume overloaded. She is short of breath with minimal activity. Can't even unload her dishwasher. Weight was 198 when I saw her  a month ago. She has been switched to Bumex - she felt like Lasix worked better. Got sent to pulmonary who sent her here. Echo updated while she was in the hospital - grade 2 DD with increased filling pressures.   PMH: 1. Diabetes mellitus 2. HTN 3. Obesity 4. Atrial arrhythmias: Paroxysmal atrial fibrillation seems to have been post-operative after CAD-AVR. She has a history of amiodarone toxicity. She has not been on coumadin. 3-week event monitor (9/13) with no atrial  fibrillation. 3-week event monitor (10/14) with runs of probable atrial tachycardia. 30 day monitor in 8/16 with atrial tachycardia (multiple episodes).  5. CAD: CABG 1998 with LIMA-LAD/diagonal, sequential SVG-OM1/OM2, SVG-RCA. Redo CABG 2009 with SVG-OM3, SVG-PDA. Lexiscan myoview (9/13): EF 61%, no ischemia or infarction. LHC (10/14) with all grafts patent except the old SVG-RCA. There was 90% stenosis in the AV LCx before ungrafted MOM, but this was unchanged from prior study. Lexiscan Cardiolite (1/15) with no ischemia. She does not tolerate Imdur.  6. CKD 7. Bioprosthetic aortic valve: Echo (2011) with EF 50-55%, bioprosthetic aortic valve well-seated. Echo (2/13) with EF 60-65%, bioprosthetic aortic valve looked ok. Echo (9/14): EF 50-55%, mild LVH, bioprosthetic AVR with mean gradient 10, mild MR, moderate TR.  8. Asthma: since her teens. 9. Low back pain.  10. 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 Per neurologist, did not appear cardioembolic.  11. Carotid stenosis: 40-59% bilateral ICA stenosis on 10/14 carotid dopplers. Carotid doppler (11/15) with 40-59% bilateral ICA stenosis. Carotid dopplers (11/16) with 40-59% BICA stenosis.  12. Sleep study 12/14 without significant OSA.  13. Chronic diastolic CHF   Past Medical History:  Diagnosis Date  . Abnormally small mouth   . Anemia   . Bilateral carotid artery stenosis    Bilateral ICA 40-59%/  >50% LECA  . Breast cancer ([place])   . Breast cancer of upper-inner quadrant of right female breast ([place]) 12/16/2015  . Bruises easily   . CAD (coronary artery disease) cardiologist-  dr Aundra Dubin   Remote CABG in 1998 with redo in 2009  . CHF (congestive heart failure) (HCC)    EF is low normal at 50 to 55% per echo in Jan. 2011  . Dyspnea   . GERD (gastroesophageal reflux disease)   . Hearing loss of right ear   . History of non-ST elevation myocardial infarction (NSTEMI)    Sept 2014--  thought to be type II HTN w/ LHC  without infarct related artery and patent grafts  . Hyperlipidemia   . Hypertension   . Iron deficiency anemia   . Moderate persistent asthma    pulmologist-  Dr. Malvin Johns  . Nausea & vomiting    related to stone  . Nonischemic cardiomyopathy ([place])   . PAF (paroxysmal atrial fibrillation) ([place])   . PONV (postoperative nausea and vomiting)    pt states patch worked well before  . Psoriasis    right leg  . Renal calculus, right   . Renal insufficiency, mild   . S/P AVR    prosthesis valve placement 2009 at same time re-do CABG  . Stroke [facility])    residual rt hearing loss  . Type 2 diabetes mellitus ([place])    monitored by dr Dwyane Dee    Past Surgical History:  Procedure Laterality Date  . AORTIC VALVE REPLACEMENT  2009   #35mm [facility] Ease pericardial valve (done same time is CABG)  . BREAST LUMPECTOMY WITH RADIOACTIVE SEED AND SENTINEL LYMPH NODE BIOPSY Right 01/18/2016  Procedure: RIGHT BREAST LUMPECTOMY WITH RADIOACTIVE SEED AND SENTINEL LYMPH NODE BIOPSY;  Surgeon: Alphonsa Overall, MD;  Location: [place];  Service: General;  Laterality: Right;  . CARDIAC CATHETERIZATION  03/23/2008   Pre-redo CABG: L main OK, LAD (T), CFX (T), OM1 99%, RCA (T), LIMA-LAD OK, SVG-OM(?3) OK w/ little florw to OM2, SVG-RCA OK. EF NL  . CARDIOVASCULAR STRESS TEST  06-15-2013      dr Debara Pickett   [facility] perfusion study/  no evidence ischemia/  normal wall motion, ef 72%  . CARPAL TUNNEL RELEASE    . COLONOSCOPY    . CORONARY ARTERY BYPASS GRAFT  1998 &  re-do 2009   Had LIMA to DX/LAD, SVG to 2 marginal branches and SVG to [facility] originally; SVG to 3rd OM and PD at time of redo  . CYSTOSCOPY W/ URETERAL STENT PLACEMENT Right 12/20/2014   Procedure: CYSTOSCOPY WITH RETROGRADE PYELOGRAM/URETERAL STENT PLACEMENT;  Surgeon: Cleon Gustin, MD;  Location: [facility];  Service: Urology;  Laterality: Right;  . EYE SURGERY Bilateral    cataracts  . HOLMIUM LASER APPLICATION Right 9/56/2130    Procedure:  HOLMIUM LASER LITHOTRIPSY;  Surgeon: Cleon Gustin, MD;  Location: [facility];  Service: Urology;  Laterality: Right;  . LEFT HEART CATHETERIZATION WITH CORONARY/GRAFT ANGIOGRAM N/A 02/23/2013   Procedure: LEFT HEART CATHETERIZATION WITH Beatrix Fetters;  Surgeon: Blane Ohara, MD;  Location: [facility] CATH LAB;  Service: Cardiovascular;  Laterality: N/A;  . PORTACATH PLACEMENT Left 01/18/2016   Procedure: INSERTION PORT-A-CATH;  Surgeon: Alphonsa Overall, MD;  Location: [place];  Service: General;  Laterality: Left;  . TONSILLECTOMY    . TRANSTHORACIC ECHOCARDIOGRAM  02-24-2013      mild LVH,  ef 50-55%/  AV bioprosthesis was present with very mild stenosis and no regurg., mean grandient 31mmHg, peak grandient 63mmHg /  mild MR/  mild LAE and RAE/  moderate TR  . TUBAL LIGATION       Medications: Current Outpatient Prescriptions  Medication Sig Dispense Refill  . ACCU-CHEK AVIVA PLUS test strip     . alendronate (FOSAMAX) 70 MG tablet Take 70 mg by mouth once a week. Take with a full glass of water on an empty stomach.    Marland Kitchen aspirin EC 81 MG EC tablet Take 1 tablet (81 mg total) by mouth daily.    . budesonide-formoterol (SYMBICORT) 160-4.5 MCG/ACT inhaler INHALE TWO PUFFS INTO LUNGS TWICE DAILY 1 Inhaler 11  . cephALEXin (KEFLEX) 500 MG capsule Take 1 capsule (500 mg total) by mouth 2 (two) times daily. 22 capsule 0  . clopidogrel (PLAVIX) 75 MG tablet Take 75 mg by mouth daily.    Marland Kitchen docusate sodium (COLACE) 100 MG capsule Take 1 capsule (100 mg total) by mouth daily as needed for mild constipation. 10 capsule 0  . gabapentin (NEURONTIN) 300 MG capsule Take 1 capsule (300 mg total) by mouth 3 (three) times daily. 90 capsule 3  . HUMALOG 100 UNIT/ML injection Inject 76 units daily with  V-GO pump 70 mL 2  . lidocaine-prilocaine (EMLA) cream Apply to affected area once 30 g 3  . loratadine (CLARITIN) 10 MG tablet Take 10 mg by mouth daily.    Marland Kitchen LORazepam  (ATIVAN) 2 MG tablet Take 0.5 tablets (1 mg total) by mouth at bedtime. 60 tablet 1  . metoprolol succinate (TOPROL-XL) 100 MG 24 hr tablet Take 1 tablet (100 mg total) by mouth 2 (two) times daily. Take with or immediately  following a meal. 60 tablet 0  . omeprazole (PRILOSEC OTC) 20 MG tablet Take 20 mg by mouth daily as needed (heartburn).     . rosuvastatin (CRESTOR) 40 MG tablet TAKE ONE TABLET BY MOUTH ONCE DAILY 30 tablet 11  . TraMADol HCl 200 MG CP24 Take 200 mg by mouth as needed.     . TRULICITY 5.63 JS/9.7WY SOPN Inject 0.75 mg into the skin every Wednesday.     Marland Kitchen ZETIA 10 MG tablet Take 1 tablet by mouth  daily 90 tablet 2  . furosemide (LASIX) 80 MG tablet Take 1 tablet (80 mg total) by mouth daily. 1 tablet in the AM and 1/2 tablet (40 mg) in the early afternoon. 90 tablet 3  . potassium chloride SA (KLOR-CON M20) 20 MEQ tablet Take 1 tablet (20 mEq total) by mouth 2 (two) times daily. 60 tablet 3   No current facility-administered medications for this visit.     Allergies: Allergies  Allergen Reactions  . Amoxicillin Rash    Has patient had a PCN reaction causing immediate rash, facial/tongue/throat swelling, SOB or lightheadedness with hypotension: No Has patient had a PCN reaction causing severe rash involving mucus membranes or skin necrosis: Yes Has patient had a PCN reaction that required hospitalization No Has patient had a PCN reaction occurring within the last 10 years: No If all of the above answers are "NO", then may proceed with Cephalosporin use.   . Imdur [Isosorbide Dinitrate] Other (See Comments)    Headache/severe hypotension/Syncope  . Latex Itching  . Tetracycline Rash    Social History: The patient  reports that she has never smoked. She has never used smokeless tobacco. She reports that she does not drink alcohol or use drugs.   Family History: The patient's family history includes Diabetes in her maternal grandmother and son; Heart disease in her  father and mother; Heart failure in her father.   Review of Systems: Please see the history of present illness.   Otherwise, the review of systems is positive for none.   All other systems are reviewed and negative.   Physical Exam: VS:  BP (!) 88/60   Pulse 83   Ht 5\' 5"  (1.651 m)   Wt 221 lb (100.2 kg)   SpO2 100%   BMI 36.78 kg/m  .  BMI Body mass index is 36.78 kg/m.  Wt Readings from Last 3 Encounters:  05/01/16 221 lb (100.2 kg)  04/27/16 220 lb 9.6 oz (100.1 kg)  04/25/16 221 lb (100.2 kg)    General: Pleasant. She looks pale. She is alert and in no acute distress.   HEENT: Normal.  Neck: Supple, no JVD, carotid bruits, or masses noted.  Cardiac: Regular rate and rhythm. Soft murmur. Over 2+ edema. She has sacral edema. Fluid up to the thighs noted.  Respiratory:  Lungs are fairly clear to auscultation bilaterally with normal work of breathing.  GI: Soft and nontender.  MS: No deformity or atrophy. Gait and ROM intact.  Skin: Warm and dry. Color is pale.  Neuro:  Strength and sensation are intact and no gross focal deficits noted.  Psych: Alert, appropriate and with normal affect.   LABORATORY DATA:  EKG:  EKG is not ordered today.  Lab Results  Component Value Date   WBC 6.4 04/27/2016   HGB 8.5 (L) 04/27/2016   HCT 27.3 (L) 04/27/2016   PLT 136 (L) 04/27/2016   GLUCOSE 109 04/27/2016   CHOL 116 (L) 07/05/2015   TRIG  99 07/05/2015   HDL 38 (L) 07/05/2015   LDLDIRECT 109.2 03/01/2014   LDLCALC 58 07/05/2015   ALT 24 04/27/2016   AST 28 04/27/2016   NA 143 04/27/2016   K 4.1 04/27/2016   CL 105 04/09/2016   CREATININE 1.3 (H) 04/27/2016   BUN 29.5 (H) 04/27/2016   CO2 29 04/27/2016   TSH 3.355 04/08/2016   INR 1.06 06/15/2013   HGBA1C 6.5 01/05/2016   MICROALBUR 24.3 (H) 09/19/2015    BNP (last 3 results) No results for input(s): BNP in the last 8760 hours.  ProBNP (last 3 results) No results for input(s): PROBNP in the last 8760  hours.   Other Studies Reviewed Today:   Echo Study Conclusions from 03/2016  - Left ventricle: The cavity size was normal. There was moderate   focal basal and mild concentric hypertrophy. Systolic function   was normal. The estimated ejection fraction was in the range of   60% to 65%. Images were inadequate for LV wall motion assessment.   Features are consistent with a pseudonormal left ventricular   filling pattern, with concomitant abnormal relaxation and   increased filling pressure (grade 2 diastolic dysfunction).   Doppler parameters are consistent with high ventricular filling   pressure. - Aortic valve: A bioprosthesis was present and functioning   normally. Mean gradient (S): 17 mm Hg. - Mitral valve: Moderately calcified annulus. - Pulmonary arteries: PA peak pressure: 38 mm Hg (S).  Impressions:  - The right ventricular systolic pressure was increased consistent   with mild pulmonary hypertension.   MYOVIEW FINDINGS FROM 06/2013: Normal resting EKG. Slight ST depressions noted in aVL after administration of lexiscan. Mild shortness of breath and nausea resolved after the test. EKG is nondiagnostic for ischemia. TID ratio 1.05. Lung-heart ratio 0.43. Normal ventricular chamber size.  IMPRESSION: No evidence for ischemia. Normal wall motion. LVEF 72%.   Electronically Signed By: Guy Sandifer On: 06/15/2013 14:27    Assessment/Plan:  1. Acute on chronic diastolic HF - - she is up from 198 when I saw her in October. This is most likely aggravated by her anemia due to chemotherapy. Stopping Bumex. Will put her on Lasix 80 mg in the AM and 40 mg in the early afternoon. Add potassium 20 meq BID. Will check lab on Tuesday. May need to give her a short course of Zaroxolyn. Will see her back on Tuesday. Try wrapping legs in ACE wraps.   2. Anemia - worse from the chemo but she has had this in the past and was on Aranesp in the past - ?if needs to be  reconsidered - will defer to oncology.   3. CAD  Admission in 9/14 with mild troponin elevation. LHC showed stable anatomy with patent grafts except for SVG-RCA from older CABG. Suspect that the troponin elevation was due to demand ischemia in the setting of hypertensive emergency and tachycardia (possible run of atrial tachycardia). Admission in 1/15 with atypical chest pain, had Lexiscan Cardiolite that showed no ischemia. No recent chest pain.  - Continue ASA 81, Plavix, statin, Toprol XL.   4. Atrial Arrhythmias Patient has not been on anticoagulation. She apparently had atrial fibrillation at some point in the remote past. It seems that the atrial fibrillation may have been post-op after redo CABG and has not recurred. She remains in NSR today. She has been noted to have atrial tachycardia but no atrial fibrillation or flutter. No real complaint of palpitations today but having so many other  issues.   5. Bioprosthetic aortic valve Most recent echo from October noted  6. Carotid bruit She is to have repeat dopplers later in the fall - has a lot going on with her breast cancer - will arrange at next follow up visit.    7. CKD  8. Breast cancer - no more chemobut with plans for radiation to follow next month.   Current medicines are reviewed with the patient today.  The patient does not have concerns regarding medicines other than what has been noted above.  The following changes have been made:  See above.  Labs/ tests ordered today include:   No orders of the defined types were placed in this encounter.    Disposition:   FU with me on Tuesday with plans for labs.   Patient is agreeable to this plan and will call if any problems develop in the interim.   Signed: [name], RN, ANP-C 05/01/2016 11:48 AM  [facility] [address] [place], [place]  [address] Phone: [phone] Fax: [phone]

## 2016-05-01 ENCOUNTER — Ambulatory Visit (INDEPENDENT_AMBULATORY_CARE_PROVIDER_SITE_OTHER): Payer: Medicare Other | Admitting: Nurse Practitioner

## 2016-05-01 ENCOUNTER — Encounter: Payer: Self-pay | Admitting: Nurse Practitioner

## 2016-05-01 VITALS — BP 88/60 | HR 83 | Ht 65.0 in | Wt 221.0 lb

## 2016-05-01 DIAGNOSIS — I10 Essential (primary) hypertension: Secondary | ICD-10-CM | POA: Diagnosis not present

## 2016-05-01 DIAGNOSIS — Z952 Presence of prosthetic heart valve: Secondary | ICD-10-CM | POA: Diagnosis not present

## 2016-05-01 DIAGNOSIS — I2510 Atherosclerotic heart disease of native coronary artery without angina pectoris: Secondary | ICD-10-CM | POA: Diagnosis not present

## 2016-05-01 DIAGNOSIS — I5032 Chronic diastolic (congestive) heart failure: Secondary | ICD-10-CM

## 2016-05-01 MED ORDER — FUROSEMIDE 80 MG PO TABS: 80.0000 mg | ORAL_TABLET | Freq: Every day | ORAL | 3 refills | Status: DC

## 2016-05-01 MED ORDER — POTASSIUM CHLORIDE CRYS ER 20 MEQ PO TBCR: 20.0000 meq | EXTENDED_RELEASE_TABLET | Freq: Two times a day (BID) | ORAL | 3 refills | Status: DC

## 2016-05-01 NOTE — Patient Instructions (Addendum)
We will be checking the following labs today - NONE   Medication Instructions:    Continue with your current medicines. BUT  I am stopping Bumex  I am adding Lasix 80 mg in the AM and 40 mg in the PM  - take until I see you back on Tuesday  I am adding potassium 20 meq to take twice a day with the lasix.     Testing/Procedures To Be Arranged:  N/A  Follow-Up:   See me on Tuesday with labs    Other Special Instructions:   Try wrapping your legs with extra large ACE wraps.     If you need a refill on your cardiac medications before your next appointment, please call your pharmacy.   Call the [place] office at [phone] if you have any questions, problems or concerns.

## 2016-05-01 NOTE — Progress Notes (Signed)
Hannah [name], if she is agreeable she probably should continue to see me in CHF clinic.  Set her up over there after your next appt with her.

## 2016-05-02 ENCOUNTER — Ambulatory Visit: Payer: Medicare Other | Attending: Hematology and Oncology | Admitting: Physical Therapy

## 2016-05-02 DIAGNOSIS — M6281 Muscle weakness (generalized): Secondary | ICD-10-CM | POA: Diagnosis present

## 2016-05-02 DIAGNOSIS — M25611 Stiffness of right shoulder, not elsewhere classified: Secondary | ICD-10-CM | POA: Diagnosis present

## 2016-05-02 DIAGNOSIS — R2681 Unsteadiness on feet: Secondary | ICD-10-CM | POA: Insufficient documentation

## 2016-05-02 DIAGNOSIS — R601 Generalized edema: Secondary | ICD-10-CM | POA: Insufficient documentation

## 2016-05-02 NOTE — Therapy (Signed)
[place], [place], [address] Phone: [phone]   Fax:  [phone]  Physical Therapy Evaluation  Patient Details  Name: Hannah [name] MRN: [id number] Date of Birth: [birth year] Referring Provider: Dr. Nicholas Lose  Encounter Date: 05/02/2016      PT End of Session - 05/02/16 1213    Visit Number 1   Number of Visits 13   Date for PT Re-Evaluation 06/15/16   PT Start Time 1108   PT Stop Time 1200   PT Time Calculation (min) 52 min   Activity Tolerance Patient tolerated treatment well   Behavior During Therapy [facility] for tasks assessed/performed      Past Medical History:  Diagnosis Date  . Abnormally small mouth   . Anemia   . Bilateral carotid artery stenosis    Bilateral ICA 40-59%/  >50% LECA  . Breast cancer ([place])   . Breast cancer of upper-inner quadrant of right female breast ([place]) 12/16/2015  . Bruises easily   . CAD (coronary artery disease) cardiologist-  dr Aundra Dubin   Remote CABG in 1998 with redo in 2009  . CHF (congestive heart failure) (HCC)    EF is low normal at 50 to 55% per echo in Jan. 2011  . Dyspnea   . GERD (gastroesophageal reflux disease)   . Hearing loss of right ear   . History of non-ST elevation myocardial infarction (NSTEMI)    Sept 2014--  thought to be type II HTN w/ LHC without infarct related artery and patent grafts  . Hyperlipidemia   . Hypertension   . Iron deficiency anemia   . Moderate persistent asthma    pulmologist-  Dr. Malvin Johns  . Nausea & vomiting    related to stone  . Nonischemic cardiomyopathy ([place])   . PAF (paroxysmal atrial fibrillation) ([place])   . PONV (postoperative nausea and vomiting)    pt states patch worked well before  . Psoriasis    right leg  . Renal calculus, right   . Renal insufficiency, mild   . S/P AVR    prosthesis valve placement 2009 at same time re-do CABG  . Stroke [facility])    residual rt hearing loss  . Type 2 diabetes  mellitus ([place])    monitored by dr Dwyane Dee    Past Surgical History:  Procedure Laterality Date  . AORTIC VALVE REPLACEMENT  2009   #75mm [facility] Ease pericardial valve (done same time is CABG)  . BREAST LUMPECTOMY WITH RADIOACTIVE SEED AND SENTINEL LYMPH NODE BIOPSY Right 01/18/2016   Procedure: RIGHT BREAST LUMPECTOMY WITH RADIOACTIVE SEED AND SENTINEL LYMPH NODE BIOPSY;  Surgeon: Alphonsa Overall, MD;  Location: [place];  Service: General;  Laterality: Right;  . CARDIAC CATHETERIZATION  03/23/2008   Pre-redo CABG: L main OK, LAD (T), CFX (T), OM1 99%, RCA (T), LIMA-LAD OK, SVG-OM(?3) OK w/ little florw to OM2, SVG-RCA OK. EF NL  . CARDIOVASCULAR STRESS TEST  06-15-2013      dr Debara Pickett   [facility] perfusion study/  no evidence ischemia/  normal wall motion, ef 72%  . CARPAL TUNNEL RELEASE    . COLONOSCOPY    . CORONARY ARTERY BYPASS GRAFT  1998 &  re-do 2009   Had LIMA to DX/LAD, SVG to 2 marginal branches and SVG to [facility] originally; SVG to 3rd OM and PD at time of redo  . CYSTOSCOPY W/ URETERAL STENT PLACEMENT Right 12/20/2014   Procedure: CYSTOSCOPY WITH RETROGRADE PYELOGRAM/URETERAL STENT PLACEMENT;  Surgeon: Cleon Gustin, MD;  Location: [name];  Service: Urology;  Laterality: Right;  . EYE SURGERY Bilateral    cataracts  . HOLMIUM LASER APPLICATION Right 7/32/2025   Procedure:  HOLMIUM LASER LITHOTRIPSY;  Surgeon: Cleon Gustin, MD;  Location: [facility];  Service: Urology;  Laterality: Right;  . LEFT HEART CATHETERIZATION WITH CORONARY/GRAFT ANGIOGRAM N/A 02/23/2013   Procedure: LEFT HEART CATHETERIZATION WITH Beatrix Fetters;  Surgeon: Blane Ohara, MD;  Location: [facility] CATH LAB;  Service: Cardiovascular;  Laterality: N/A;  . PORTACATH PLACEMENT Left 01/18/2016   Procedure: INSERTION PORT-A-CATH;  Surgeon: Alphonsa Overall, MD;  Location: [place];  Service: General;  Laterality: Left;  . TONSILLECTOMY    . TRANSTHORACIC ECHOCARDIOGRAM   02-24-2013      mild LVH,  ef 50-55%/  AV bioprosthesis was present with very mild stenosis and no regurg., mean grandient 70mmHg, peak grandient 35mmHg /  mild MR/  mild LAE and RAE/  moderate TR  . TUBAL LIGATION      There were no vitals filed for this visit.       Subjective Assessment - 05/02/16 1112    Subjective pain in legs, limited movement in legs, decreased strength in arms, swelling in several areas.  Has gained 23 lbs. in a month due to swelling; comes in with legs wrapped (just started this morning) and is on lasix.  Patient says she is swollen all over her body.  Doesn't have the strength and energy to unload the dishwasher all at once or do laundry.   Patient is accompained by: Family member  dtr.-in-law   Pertinent History Diagnosed with right breast cancer and had two lumpectomies in August with sentinel lymph node biospy (negative).  Finished chemo in late October.  Hospitalized for left leg cellulitis the last week in October and just finished antibiotic.  Will start radiation in December (assessed 05/14/16).  Diabetes and has a pump; atrial fibrillation.  Neuropathy in feet, toes especially, mostly numbness and some pain.  h/o CABG and aortic valve replacement, the latter in 2008  h/o broken right shoulder eight years ago, and reports difficulty holding the right arm up.   Patient Stated Goals "to get where I can walk better and get around, and get back to my normal life"   Currently in Pain? Yes   Pain Score 2   up to 7 or 8   Pain Location Leg   Pain Orientation Right;Left   Pain Descriptors / Indicators Aching   Pain Type Neuropathic pain   Pain Onset More than a month ago   Aggravating Factors  walking, moving   Pain Relieving Factors sitting still, gabapentin helps some            [facility] PT Assessment - 05/02/16 0001      Assessment   Medical Diagnosis right breast cancer   Referring Provider Dr. Nicholas Lose   Onset Date/Surgical Date 01/24/16  approx.    Hand Dominance Right   Prior Therapy none     Precautions   Precautions Fall;Other (comment)   Precaution Comments cancer precautions     Restrictions   Weight Bearing Restrictions No     Balance Screen   Has the patient fallen in the past 6 months Yes   How many times? 1   Has the patient had a decrease in activity level because of a fear of falling?  Yes   Is the patient reluctant to leave their  home because of a fear of falling?  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; deskwork   Leisure prior to treatment, did water exercise 2-3x/week     Cognition   Overall Cognitive Status Within Functional Limits for tasks assessed     Observation/Other Assessments   Observations left leg residual redness at anterior calf, from recent cellulitis episode that required hosptalization   Other Surveys  --  lymphedema life impact score of 55 = 81% impaired     ROM / Strength   AROM / PROM / Strength AROM;PROM;Strength     AROM   Overall AROM Comments LEs with mild limitations throughout at hips   AROM Assessment Site Shoulder   Right/Left Shoulder Right;Left   Right Shoulder Flexion 85 Degrees   Right Shoulder ABduction 69 Degrees   Right Shoulder External Rotation 52 Degrees  in sitting   Left Shoulder Flexion 121 Degrees   Left Shoulder ABduction 122 Degrees   Left Shoulder External Rotation 76 Degrees  in sitting     PROM   Overall PROM Comments hip flexion limited  actively and passively by fluid in her limbs and abdomen   PROM Assessment Site Shoulder   Right/Left Shoulder Right   Right Shoulder Flexion 102 Degrees   Right Shoulder ABduction 72 Degrees     Strength   Overall Strength Comments right UE grossly 3-/5   Strength Assessment Site Hip;Knee;Ankle   Right/Left Hip Right;Left   Right Hip Flexion 3-/5   Right Hip ABduction 3+/5   Left Hip Flexion 3-/5   Left Hip ABduction 3+/5   Right/Left Knee Right;Left   Right Knee Extension 4/5   Left Knee Extension 4-/5   Right/Left Ankle Right;Left   Right Ankle Dorsiflexion 4/5   Left Ankle Dorsiflexion 4/5     Ambulation/Gait   Ambulation/Gait Yes   Ambulation/Gait Assistance 6: Modified independent (Device/Increase time)   Gait Comments appears unstable, slow pace, slightly halting           LYMPHEDEMA/ONCOLOGY QUESTIONNAIRE - 05/02/16 1132      What other symptoms do you have   Are you having pitting edema Yes                [facility] Adult PT Treatment/Exercise - 05/02/16 0001      Manual Therapy   Manual Therapy Compression Bandaging   Compression Bandaging Instructed patient's daughter-in-law in bandaging to replace the bandaging she came in with:  lotion applied; stockinette, Artiflex x1, and 1-12 cm. short stretch bandage from ankle to knee.                PT Education - 05/02/16 1211    Education provided Yes   Education Details using short stretch bandages on calves instead of ACE wraps   Person(s) Educated Patient;Other (comment)  dtr.-in-law   Methods Explanation;Demonstration   Comprehension Verbalized understanding;Returned demonstration  Short Term Clinic Goals - 05/02/16 1226      CC Short Term Goal  #1   Title Patient will be independent in HEP including LE strengthening, right shoulder ROM   Time 3   Period Weeks   Status New     CC Short Term Goal  #2   Title Patient's family members will be knowledgeable about correct  compression bandaging for both LEs   Time 3   Period Weeks   Status New             Long Term Clinic Goals - 05/02/16 1228      CC Long Term Goal  #1   Title Will report at least 50% decrease in LE pain   Time 6   Period Weeks   Status New     CC Long Term Goal  #2   Title Will report being able to unload the dishwasher without needing to stop to rest during the process   Time 6   Period Weeks   Status New     CC Long Term Goal  #3   Title will be independent in a home exercise program including strengthening and endurance   Time 6   Period Weeks   Status New     CC Long Term Goal  #4   Title will show LE strength at least 4+/5 overall   Time 6   Period Weeks   Status New     CC Long Term Goal  #5   Title right shoulder active flexion and abduction to at least 100 degrees for improved reach and ADLs   Time 6   Period Weeks   Status New     CC Long Term Goal  #6   Title Lymphedema life impact scale impairment of 20% or less   Baseline score of 55 of 80% impairment   Time 6   Period Weeks   Status New     Additional Goals   Additional Goals Yes            Plan - 05/02/16 1214    Clinical Impression Statement Patient is a very pleasant woman accompanied by her daughter-in-law today.  She came in with ACE wraps on both calves that they had applied this morning for compression, on suggestion by her cardiologist.  She has general swelling that her cardiologist has just begun managing medically.  She has limited right shoulder ROM and limited strength in both legs and at least the right arm; some of this stems from the extra fluid her body is retaining and some may be from chemotherapy.  She reports peripheral neuropathy, particularly in her feet.  She was hospitalized for cellulitis in the left leg and still shows redness there.  Her gait is unsteady and slow.  This eval is moderate in complexity because she is diabetic with recent hospitalization and current  swelling and is evolving as she is going to have radiation and is being treated for swelling. Calves were rewrapped today, one by therapist and one by daugter-in-law with therapist giving instruction, just to get this part of her bandaging done more correctly.   Rehab Potential Excellent   Clinical Impairments Affecting Rehab Potential none   PT Frequency 2x / week   PT Duration 6 weeks   PT Treatment/Interventions ADLs/Self Care Home Management;Electrical Stimulation;DME Instruction;Gait training;Stair training;Functional mobility training;Therapeutic exercise;Balance training;Neuromuscular re-education;Patient/family education;Manual techniques;Manual lymph drainage;Compression bandaging;Passive range of motion   PT Next Visit Plan Educate caregiver  about bandaging feet and legs to knees; do Berg, DGI, or gait speed testing.  Begin strengthening of LEs, ROM with HEP for left shoulder; progress to endurance exercise and balance re-ed.   Consulted and Agree with Plan of Care Patient      Patient will benefit from skilled therapeutic intervention in order to improve the following deficits and impairments:  Decreased activity tolerance, Decreased balance, Decreased endurance, Decreased knowledge of use of DME, Decreased range of motion, Decreased strength, Increased edema, Impaired sensation, Pain, Decreased knowledge of precautions  Visit Diagnosis: Muscle weakness (generalized) - Plan: PT plan of care cert/re-cert  Generalized edema - Plan: PT plan of care cert/re-cert  Unsteadiness on feet - Plan: PT plan of care cert/re-cert  Stiffness of right shoulder, not elsewhere classified - Plan: PT plan of care cert/re-cert      G-Codes - 38/18/29 1234    Functional Assessment Tool Used lymphedema life impact scale   Functional Limitation Self care   Self Care Current Status (H3716) At least 80 percent but less than 100 percent impaired, limited or restricted   Self Care Goal Status (R6789) At  least 1 percent but less than 20 percent impaired, limited or restricted       Problem List Patient Active Problem List   Diagnosis Date Noted  . Chemotherapy-induced peripheral neuropathy ([place]) 04/27/2016  . 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 Cellulitis of left leg 04/06/2016  . Osteomyelitis of toe of left foot ([place]) 04/06/2016  . Antineoplastic chemotherapy induced anemia 03/09/2016  . Mania ([place]) 03/05/2016  . GERD (gastroesophageal reflux disease) 02/29/2016  . Chemotherapy induced nausea and vomiting 02/24/2016  . Port catheter in place 02/17/2016  . Breast cancer of upper-inner quadrant of right female breast ([place]) 12/16/2015  . Acute bronchitis 08/01/2015  . CKD (chronic kidney disease) 09/24/2013  . Chronic diastolic CHF (congestive heart failure) ([place]) 09/24/2013  . Acute on chronic combined systolic and diastolic CHF, NYHA class 3 ([place]) 07/17/2013  . Chest pain 06/15/2013  . Unstable angina ([place]) 06/14/2013  . Atrial tachycardia ([place]) 04/03/2013  . Non-ST elevation myocardial infarction (NSTEMI), initial episode of care ([place]) 02/23/2013  . CAD (coronary artery disease) 01/24/2011  . S/P aortic valve replacement 10/27/2010  . Hypertension   . Hyperlipidemia   . SOB (shortness of breath)   . CHF (congestive heart failure) ([place])   . Renal insufficiency, mild   . Dyspnea   . Nonischemic cardiomyopathy ([place])   . Anemia   . Diabetes mellitus, type 2 ([place])   . Iron deficiency anemia   . Allergic rhinitis 10/14/2009  . COUGH 10/14/2009  . HYPERLIPIDEMIA 05/12/2009  . HYPERTENSION 05/12/2009  . Atrial fibrillation ([place]) 05/12/2009  . Asthma 05/12/2009  . DYSPNEA 05/12/2009    SALISBURY,[name] 05/02/2016, 12:38 PM  [place], [place], [address] Phone: [phone]   Fax:  [phone]  Name: Hannah [name] MRN: [id number] Date of Birth: 11-06-1945  Serafina Royals,  PT 05/02/16 12:38 PM

## 2016-05-07 ENCOUNTER — Encounter: Payer: Self-pay | Admitting: Nurse Practitioner

## 2016-05-08 ENCOUNTER — Ambulatory Visit (INDEPENDENT_AMBULATORY_CARE_PROVIDER_SITE_OTHER): Payer: Medicare Other | Admitting: Nurse Practitioner

## 2016-05-08 ENCOUNTER — Encounter: Payer: Self-pay | Admitting: Nurse Practitioner

## 2016-05-08 ENCOUNTER — Other Ambulatory Visit (INDEPENDENT_AMBULATORY_CARE_PROVIDER_SITE_OTHER): Payer: Medicare Other

## 2016-05-08 VITALS — BP 110/60 | HR 108 | Ht 65.0 in | Wt 214.1 lb

## 2016-05-08 DIAGNOSIS — I2510 Atherosclerotic heart disease of native coronary artery without angina pectoris: Secondary | ICD-10-CM | POA: Diagnosis not present

## 2016-05-08 DIAGNOSIS — E1165 Type 2 diabetes mellitus with hyperglycemia: Secondary | ICD-10-CM

## 2016-05-08 DIAGNOSIS — I10 Essential (primary) hypertension: Secondary | ICD-10-CM | POA: Diagnosis not present

## 2016-05-08 DIAGNOSIS — Z952 Presence of prosthetic heart valve: Secondary | ICD-10-CM

## 2016-05-08 DIAGNOSIS — Z794 Long term (current) use of insulin: Secondary | ICD-10-CM

## 2016-05-08 DIAGNOSIS — I5032 Chronic diastolic (congestive) heart failure: Secondary | ICD-10-CM

## 2016-05-08 LAB — BASIC METABOLIC PANEL
BUN: 32 mg/dL — ABNORMAL HIGH (ref 7–25)
BUN: 31 mg/dL — ABNORMAL HIGH (ref 6–23)
CO2: 33 mmol/L — ABNORMAL HIGH (ref 20–31)
CO2: 35 mEq/L — ABNORMAL HIGH (ref 19–32)
Calcium: 9.1 mg/dL (ref 8.6–10.4)
Calcium: 9.5 mg/dL (ref 8.4–10.5)
Chloride: 100 mmol/L (ref 98–110)
Chloride: 100 mEq/L (ref 96–112)
Creat: 1.38 mg/dL — ABNORMAL HIGH (ref 0.60–0.93)
Creatinine, Ser: 1.45 mg/dL — ABNORMAL HIGH (ref 0.40–1.20)
GFR: 37.87 mL/min — ABNORMAL LOW (ref >60.00)
Glucose, Bld: 103 mg/dL — ABNORMAL HIGH (ref 65–99)
Glucose, Bld: 103 mg/dL — ABNORMAL HIGH (ref 70–99)
Potassium: 3.9 mmol/L (ref 3.5–5.3)
Potassium: 4 mEq/L (ref 3.5–5.1)
Sodium: 141 mmol/L (ref 135–146)
Sodium: 142 mEq/L (ref 135–145)

## 2016-05-08 LAB — CBC
HCT: 30.3 % — ABNORMAL LOW (ref 35.0–45.0)
Hemoglobin: 9.5 g/dL — ABNORMAL LOW (ref 11.7–15.5)
MCH: 30.2 pg (ref 27.0–33.0)
MCHC: 31.4 g/dL — ABNORMAL LOW (ref 32.0–36.0)
MCV: 96.2 fL (ref 80.0–100.0)
MPV: 9 fL (ref 7.5–12.5)
Platelets: 173 10*3/uL (ref 140–400)
RBC: 3.15 MIL/uL — ABNORMAL LOW (ref 3.80–5.10)
RDW: 15.3 % — ABNORMAL HIGH (ref 11.0–15.0)
WBC: 7.4 10*3/uL (ref 3.8–10.8)

## 2016-05-08 LAB — BRAIN NATRIURETIC PEPTIDE: Brain Natriuretic Peptide: 209.4 pg/mL — ABNORMAL HIGH (ref <100)

## 2016-05-08 LAB — HEMOGLOBIN A1C: Hgb A1c MFr Bld: 6.4 % (ref 4.6–6.5)

## 2016-05-08 NOTE — Progress Notes (Signed)
CARDIOLOGY OFFICE NOTE  Date:  05/08/2016    Hannah [name] Date of Birth: [birth year] Medical Record #[id number]  PCP:  Nicola Girt, DO  Cardiologist:  Annabell Howells    Chief Complaint  Patient presents with  . Congestive Heart Failure    Follow up visit - seen for Dr. Aundra Dubin    History of Present Illness: Hannah [name] is a 70 y.o. female who presents today for a one week check. She is a former patient of Dr. Susa Simmonds. Seen for Dr. Aundra Dubin. She now follows with me.   She has a history of CAD s/p CABG, bioprosthetic AVR, CKD, paroxysmal atrial fibrillation presents for cardiology followup. She had her initial CABG in 1998, then had redo CABG in 2009 with bioprosthetic AVR. She does not remember when she last had documented atrial fibrillation, but she thinks it was a number of years ago, sounds peri-operative around the time of her redo CABG. She was never put on anticoagulation but had been on dronedarone.   In 9/14, she developed tachypalpitations and later chest pain. In the ER, SBP was in the 200s with elevated HR. Troponin was mildly elevated. Therefore, she had a cardiac cath showing patent grafts. She was thought to have had demand ischemia in the setting of hypertensive emergency + tachycardia. After discharge, she continued to have runs of tachypalpitations. A 3 week event monitor was put on her. This showed runs, sometimes long, of atrial tachycardia. She was started on Toprol XL when these runs were noted. No further palpitations (complete resolution of symptoms).   In 1/15, she was admitted to [facility] with atypical chest pain. She was ruled out for MI and Lexiscan Cardiolite showed no ischemia. She was started on Imdur and sent home. After discharge, she developed a daily headache and nausea. She did not eat or drink much due to the nausea. Shortly after this, she was standing in her grandson's pediatrician's office and became severely  lightheaded. She had to sit down. She came to the ER and was found to be orthostatic. She was given IV fluid and got better quickly. The Imdur was stopped and symptoms seem to have completely resolved.   Event monitor in 8/16 showed atrial tachycardia versus atypical atrial flutter. Toprol XL was increased to 100 mg bid. She saw Dr Lovena Le who thought that she had short runs of atrial tachycardia and not atrial flutter. Therefore, she has not been anticoagulated. She hvaingoccasional brief palpitations but overall was felt to bedoing better.   She saw Dr. Aundra Dubin back in April of 2017 - she was doing well.   Has been diagnosed with breast cancer since her last visit here. Has had surgery. Getting chemo/radiation - no Adriamycin due to her history of CHF.  I last saw her back in October - getting chemo. Overall seemed to be holding her on.   I saw her last week - very volume overload - unable to get her 4th dose of chemo due to severe fatigue,, neuropathy and cellulitis. Anemic. Referred back here from pulmonary. I diuresed her.   Comes back today. Here with her daughter in law again today. Weight is down 7 pounds over the past week. Weight was 198 when I saw her in October. HR is faster. She does not seem too bothered by that. Her breathing is improved. She is able to be more active with less symptoms.  Her swelling is improving. No chest pain. She can now get  some compression stockings on. She will be starting radiation next week.   PMH: 1. Diabetes mellitus 2. HTN 3. Obesity 4. Atrial arrhythmias: Paroxysmal atrial fibrillation seems to have been post-operative after CAD-AVR. She has a history of amiodarone toxicity. She has not been on coumadin. 3-week event monitor (9/13) with no atrial fibrillation. 3-week event monitor (10/14) with runs of probable atrial tachycardia. 30 day monitor in 8/16 with atrial tachycardia (multiple episodes).  5. CAD: CABG 1998 with  LIMA-LAD/diagonal, sequential SVG-OM1/OM2, SVG-RCA. Redo CABG 2009 with SVG-OM3, SVG-PDA. Lexiscan myoview (9/13): EF 61%, no ischemia or infarction. LHC (10/14) with all grafts patent except the old SVG-RCA. There was 90% stenosis in the AV LCx before ungrafted MOM, but this was unchanged from prior study. Lexiscan Cardiolite (1/15) with no ischemia. She does not tolerate Imdur.  6. CKD 7. Bioprosthetic aortic valve: Echo (2011) with EF 50-55%, bioprosthetic aortic valve well-seated. Echo (2/13) with EF 60-65%, bioprosthetic aortic valve looked ok. Echo (9/14): EF 50-55%, mild LVH, bioprosthetic AVR with mean gradient 10, mild MR, moderate TR.  8. Asthma: since her teens. 9. Low back pain.  10. 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 Per neurologist, did not appear cardioembolic.  11. Carotid stenosis: 40-59% bilateral ICA stenosis on 10/14 carotid dopplers. Carotid doppler (11/15) with 40-59% bilateral ICA stenosis. Carotid dopplers (11/16) with 40-59% BICA stenosis.  12. Sleep study 12/14 without significant OSA.  13. Chronic diastolic CHF    Past Medical History:  Diagnosis Date  . Abnormally small mouth   . Anemia   . Bilateral carotid artery stenosis    Bilateral ICA 40-59%/  >50% LECA  . Breast cancer ([place])   . Breast cancer of upper-inner quadrant of right female breast ([place]) 12/16/2015  . Bruises easily   . CAD (coronary artery disease) cardiologist-  dr Aundra Dubin   Remote CABG in 1998 with redo in 2009  . CHF (congestive heart failure) (H[facility])    EF is low normal at 50 to 55% per echo in Jan. 2011  . Dyspnea   . GERD (gastroesophageal reflux disease)   . Hearing loss of right ear   . History of non-ST elevation myocardial infarction (NSTEMI)    Sept 2014--  thought to be type II HTN w/ LHC without infarct related artery and patent grafts  . Hyperlipidemia   . Hypertension   . Iron deficiency anemia   . Moderate persistent asthma    pulmologist-  Dr. Malvin Johns  .  Nausea & vomiting    related to stone  . Nonischemic cardiomyopathy ([place])   . PAF (paroxysmal atrial fibrillation) ([place])   . PONV (postoperative nausea and vomiting)    pt states patch worked well before  . Psoriasis    right leg  . Renal calculus, right   . Renal insufficiency, mild   . S/P AVR    prosthesis valve placement 2009 at same time re-do CABG  . Stroke [facility])    residual rt hearing loss  . Type 2 diabetes mellitus ([place])    monitored by dr Dwyane Dee    Past Surgical History:  Procedure Laterality Date  . AORTIC VALVE REPLACEMENT  2009   #1mm [facility] Ease pericardial valve (done same time is CABG)  . BREAST LUMPECTOMY WITH RADIOACTIVE SEED AND SENTINEL LYMPH NODE BIOPSY Right 01/18/2016   Procedure: RIGHT BREAST LUMPECTOMY WITH RADIOACTIVE SEED AND SENTINEL LYMPH NODE BIOPSY;  Surgeon: Alphonsa Overall, MD;  Location: [place];  Service: General;  Laterality:  Right;  Marland Kitchen CARDIAC CATHETERIZATION  03/23/2008   Pre-redo CABG: L main OK, LAD (T), CFX (T), OM1 99%, RCA (T), LIMA-LAD OK, SVG-OM(?3) OK w/ little florw to OM2, SVG-RCA OK. EF NL  . CARDIOVASCULAR STRESS TEST  06-15-2013      dr Debara Pickett   [facility] perfusion study/  no evidence ischemia/  normal wall motion, ef 72%  . CARPAL TUNNEL RELEASE    . COLONOSCOPY    . CORONARY ARTERY BYPASS GRAFT  1998 &  re-do 2009   Had LIMA to DX/LAD, SVG to 2 marginal branches and SVG to [facility] originally; SVG to 3rd OM and PD at time of redo  . CYSTOSCOPY W/ URETERAL STENT PLACEMENT Right 12/20/2014   Procedure: CYSTOSCOPY WITH RETROGRADE PYELOGRAM/URETERAL STENT PLACEMENT;  Surgeon: Cleon Gustin, MD;  Location[facility];  Service: Urology;  Laterality: Right;  . EYE SURGERY Bilateral    cataracts  . HOLMIUM LASER APPLICATION Right 09/17/8117   Procedure:  HOLMIUM LASER LITHOTRIPSY;  Surgeon: Cleon Gustin, MD;  Location[facility];  Service: Urology;  Laterality: Right;  . LEFT HEART CATHETERIZATION  WITH CORONARY/GRAFT ANGIOGRAM N/A 02/23/2013   Procedure: LEFT HEART CATHETERIZATION WITH Beatrix Fetters;  Surgeon: Blane Ohara, MD;  Location: [facility] CATH LAB;  Service: Cardiovascular;  Laterality: N/A;  . PORTACATH PLACEMENT Left 01/18/2016   Procedure: INSERTION PORT-A-CATH;  Surgeon: Alphonsa Overall, MD;  Location: [place];  Service: General;  Laterality: Left;  . TONSILLECTOMY    . TRANSTHORACIC ECHOCARDIOGRAM  02-24-2013      mild LVH,  ef 50-55%/  AV bioprosthesis was present with very mild stenosis and no regurg., mean grandient 62mmHg, peak grandient 20mmHg /  mild MR/  mild LAE and RAE/  moderate TR  . TUBAL LIGATION       Medications: Current Outpatient Prescriptions  Medication Sig Dispense Refill  . ACCU-CHEK AVIVA PLUS test strip     . alendronate (FOSAMAX) 70 MG tablet Take 70 mg by mouth once a week. Take with a full glass of water on an empty stomach.    Marland Kitchen aspirin EC 81 MG EC tablet Take 1 tablet (81 mg total) by mouth daily.    . budesonide-formoterol (SYMBICORT) 160-4.5 MCG/ACT inhaler INHALE TWO PUFFS INTO LUNGS TWICE DAILY 1 Inhaler 11  . cephALEXin (KEFLEX) 500 MG capsule Take 1 capsule (500 mg total) by mouth 2 (two) times daily. 22 capsule 0  . clopidogrel (PLAVIX) 75 MG tablet Take 75 mg by mouth daily.    Marland Kitchen docusate sodium (COLACE) 100 MG capsule Take 1 capsule (100 mg total) by mouth daily as needed for mild constipation. 10 capsule 0  . furosemide (LASIX) 80 MG tablet Take 1 tablet (80 mg total) by mouth daily. 1 tablet in the AM and 1/2 tablet (40 mg) in the early afternoon. 90 tablet 3  . gabapentin (NEURONTIN) 300 MG capsule Take 1 capsule (300 mg total) by mouth 3 (three) times daily. 90 capsule 3  . HUMALOG 100 UNIT/ML injection Inject 76 units daily with  V-GO pump 70 mL 2  . lidocaine-prilocaine (EMLA) cream Apply to affected area once 30 g 3  . loratadine (CLARITIN) 10 MG tablet Take 10 mg by mouth daily.    Marland Kitchen LORazepam (ATIVAN) 2 MG tablet Take 0.5  tablets (1 mg total) by mouth at bedtime. 60 tablet 1  . metoprolol succinate (TOPROL-XL) 100 MG 24 hr tablet Take 1 tablet (100 mg total) by mouth 2 (  two) times daily. Take with or immediately following a meal. 60 tablet 0  . omeprazole (PRILOSEC OTC) 20 MG tablet Take 20 mg by mouth daily as needed (heartburn).     . potassium chloride SA (KLOR-CON M20) 20 MEQ tablet Take 1 tablet (20 mEq total) by mouth 2 (two) times daily. 60 tablet 3  . rosuvastatin (CRESTOR) 40 MG tablet TAKE ONE TABLET BY MOUTH ONCE DAILY 30 tablet 11  . TraMADol HCl 200 MG CP24 Take 200 mg by mouth as needed.     . TRULICITY 6.14 ER/1.5QM SOPN Inject 0.75 mg into the skin every Wednesday.     Marland Kitchen ZETIA 10 MG tablet Take 1 tablet by mouth  daily 90 tablet 2   No current facility-administered medications for this visit.     Allergies: Allergies  Allergen Reactions  . Amoxicillin Rash    Has patient had a PCN reaction causing immediate rash, facial/tongue/throat swelling, SOB or lightheadedness with hypotension: No Has patient had a PCN reaction causing severe rash involving mucus membranes or skin necrosis: Yes Has patient had a PCN reaction that required hospitalization No Has patient had a PCN reaction occurring within the last 10 years: No If all of the above answers are "NO", then may proceed with Cephalosporin use.   . Imdur [Isosorbide Dinitrate] Other (See Comments)    Headache/severe hypotension/Syncope  . Latex Itching  . Tetracycline Rash    Social History: The patient  reports that she has never smoked. She has never used smokeless tobacco. She reports that she does not drink alcohol or use drugs.   Family History: The patient's family history includes Diabetes in her maternal grandmother and son; Heart disease in her father and mother; Heart failure in her father.   Review of Systems: Please see the history of present illness.   Otherwise, the review of systems is positive for none.   All other  systems are reviewed and negative.   Physical Exam: VS:  BP 110/60   Pulse (!) 108   Ht 5\' 5"  (1.651 m)   Wt 214 lb 1.9 oz (97.1 kg)   BMI 35.63 kg/m  .  BMI Body mass index is 35.63 kg/m.  Wt Readings from Last 3 Encounters:  05/08/16 214 lb 1.9 oz (97.1 kg)  05/01/16 221 lb (100.2 kg)  04/27/16 220 lb 9.6 oz (100.1 kg)    General: Pleasant. Chronically ill appearing but alert and in no acute distress. Seems more upbeat now.   Weight is down 7 pounds over the past week.  HEENT: Normal.  Neck: Supple, no JVD, carotid bruits, or masses noted.  Cardiac: Regular rate and rhythm. Heart rate is faster today but regular. No murmurs, rubs, or gallops. Less edema. Has sacral edema noted.  Respiratory:  Lungs are clear to auscultation bilaterally with normal work of breathing.  GI: Soft and nontender.  MS: No deformity or atrophy. Gait and ROM intact.  Skin: Warm and dry. Color is pale.  Neuro:  Strength and sensation are intact and no gross focal deficits noted.  Psych: Alert, appropriate and with normal affect.   LABORATORY DATA:  EKG:  EKG is ordered today.  Lab Results  Component Value Date   WBC 6.4 04/27/2016   HGB 8.5 (L) 04/27/2016   HCT 27.3 (L) 04/27/2016   PLT 136 (L) 04/27/2016   GLUCOSE 109 04/27/2016   CHOL 116 (L) 07/05/2015   TRIG 99 07/05/2015   HDL 38 (L) 07/05/2015   LDLDIRECT 109.2  03/01/2014   LDLCALC 58 07/05/2015   ALT 24 04/27/2016   AST 28 04/27/2016   NA 143 04/27/2016   K 4.1 04/27/2016   CL 105 04/09/2016   CREATININE 1.3 (H) 04/27/2016   BUN 29.5 (H) 04/27/2016   CO2 29 04/27/2016   TSH 3.355 04/08/2016   INR 1.06 06/15/2013   HGBA1C 6.5 01/05/2016   MICROALBUR 24.3 (H) 09/19/2015    BNP (last 3 results) No results for input(s): BNP in the last 8760 hours.  ProBNP (last 3 results) No results for input(s): PROBNP in the last 8760 hours.   Other Studies Reviewed Today:  Echo Study Conclusions from 03/2016  - Left ventricle: The  cavity size was normal. There was moderate focal basal and mild concentric hypertrophy. Systolic function was normal. The estimated ejection fraction was in the range of 60% to 65%. Images were inadequate for LV wall motion assessment. Features are consistent with a pseudonormal left ventricular filling pattern, with concomitant abnormal relaxation and increased filling pressure (grade 2 diastolic dysfunction). Doppler parameters are consistent with high ventricular filling pressure. - Aortic valve: A bioprosthesis was present and functioning normally. Mean gradient (S): 17 mm Hg. - Mitral valve: Moderately calcified annulus. - Pulmonary arteries: PA peak pressure: 38 mm Hg (S).  Impressions:  - The right ventricular systolic pressure was increased consistent with mild pulmonary hypertension.   MYOVIEW FINDINGS FROM 06/2013: Normal resting EKG. Slight ST depressions noted in aVL after administration of lexiscan. Mild shortness of breath and nausea resolved after the test. EKG is nondiagnostic for ischemia. TID ratio 1.05. Lung-heart ratio 0.43. Normal ventricular chamber size.  IMPRESSION: No evidence for ischemia. Normal wall motion. LVEF 72%.   Electronically Signed By: Guy Sandifer On: 06/15/2013 14:27    Assessment/Plan:  1. Acute on chronic diastolic HF - - she is improved with her current diuretic but still up from 198 when I saw her back in October. This is most likely aggravated by her anemia due to chemotherapy. I am going to leave her on her current regimen with the increased dose of Lasix. Lab today. See back in 2 weeks. Will hold on Zaroxolyn for now.   2. Anemia - worse from the chemo but she has had this in the past and was on Aranesp in the past - ?if needs to be reconsidered - will defer to oncology.   3. CAD  Admission in 9/14 with mild troponin elevation. LHC showed stable anatomy with patent grafts except for SVG-RCA  from older CABG. Suspect that the troponin elevation was due to demand ischemia in the setting of hypertensive emergency and tachycardia (possible run of atrial tachycardia). Admission in 1/15 with atypical chest pain, had Lexiscan Cardiolite that showed no ischemia. No recent chest pain.  - Continue ASA 81, Plavix, statin, Toprol XL.   4. Atrial Arrhythmias Patient has not been on anticoagulation. She apparently had atrial fibrillation at some point in the remote past. It seems that the atrial fibrillation may have been post-op after redo CABG and has not recurred.  She has been noted to have atrial tachycardia but no atrial fibrillation or flutter. No real complaint of palpitations today but having so many other issues. Heart rate is faster today - but regular. Still looks to be sinus with 1st degree AV block.  5. Bioprosthetic aortic valve Most recent echo from October noted  6. Carotid bruit She is to have repeat dopplers later in the fall - has a lot going on with  her breast cancer - will arrange at next follow up visit.   7. CKD  8. Breast cancer- no more chemobut with plans for radiation to follow starting next week.   Current medicines are reviewed with the patient today.  The patient does not have concerns regarding medicines other than what has been noted above.  The following changes have been made:  See above.  Labs/ tests ordered today include:    Orders Placed This Encounter  Procedures  . Brain natriuretic peptide  . Basic metabolic panel  . CBC  . EKG 12-Lead     Disposition:   FU with me in 2 weeks.   Patient is agreeable to this plan and will call if any problems develop in the interim.   Signed: [name], RN, ANP-C 05/08/2016 11:33 AM  [facility] [address] [place], [place]  [address] Phone: [phone] Fax: [phone]

## 2016-05-08 NOTE — Patient Instructions (Addendum)
We will be checking the following labs today - BMET, BNP and CBC   Medication Instructions:    Continue with your current medicines.     Testing/Procedures To Be Arranged:  N/A  Follow-Up:   See me in 2 weeks    Other Special Instructions:   N/A    If you need a refill on your cardiac medications before your next appointment, please call your pharmacy.   Call the [place] office at [phone] if you have any questions, problems or concerns.

## 2016-05-09 ENCOUNTER — Ambulatory Visit: Payer: Medicare Other

## 2016-05-09 DIAGNOSIS — R2681 Unsteadiness on feet: Secondary | ICD-10-CM

## 2016-05-09 DIAGNOSIS — M25611 Stiffness of right shoulder, not elsewhere classified: Secondary | ICD-10-CM

## 2016-05-09 DIAGNOSIS — M6281 Muscle weakness (generalized): Secondary | ICD-10-CM | POA: Diagnosis not present

## 2016-05-09 DIAGNOSIS — R601 Generalized edema: Secondary | ICD-10-CM

## 2016-05-09 NOTE — Therapy (Signed)
[place], [place], [address] Phone: [phone]   Fax:  [phone]  Physical Therapy Treatment  Patient Details  Name: Hannah [name] MRN: [id number] Date of Birth: Oct 12, 1945 Referring Provider: Dr. Nicholas Lose  Encounter Date: 05/09/2016      PT End of Session - 05/09/16 1128    Visit Number 2   Number of Visits 13   Date for PT Re-Evaluation 06/15/16   PT Start Time 1017   PT Stop Time 1115   PT Time Calculation (min) 58 min   Activity Tolerance Patient tolerated treatment well   Behavior During Therapy [facility] for tasks assessed/performed      Past Medical History:  Diagnosis Date  . Abnormally small mouth   . Anemia   . Bilateral carotid artery stenosis    Bilateral ICA 40-59%/  >50% LECA  . Breast cancer ([place])   . Breast cancer of upper-inner quadrant of right female breast ([place]) 12/16/2015  . Bruises easily   . CAD (coronary artery disease) cardiologist-  dr Aundra Dubin   Remote CABG in 1998 with redo in 2009  . CHF (congestive heart failure) (HCC)    EF is low normal at 50 to 55% per echo in Jan. 2011  . Dyspnea   . GERD (gastroesophageal reflux disease)   . Hearing loss of right ear   . History of non-ST elevation myocardial infarction (NSTEMI)    Sept 2014--  thought to be type II HTN w/ LHC without infarct related artery and patent grafts  . Hyperlipidemia   . Hypertension   . Iron deficiency anemia   . Moderate persistent asthma    pulmologist-  Dr. Malvin Johns  . Nausea & vomiting    related to stone  . Nonischemic cardiomyopathy ([place])   . PAF (paroxysmal atrial fibrillation) ([place])   . PONV (postoperative nausea and vomiting)    pt states patch worked well before  . Psoriasis    right leg  . Renal calculus, right   . Renal insufficiency, mild   . S/P AVR    prosthesis valve placement 2009 at same time re-do CABG  . Stroke [facility])    residual rt hearing loss  . Type 2 diabetes  mellitus ([place])    monitored by dr Dwyane Dee    Past Surgical History:  Procedure Laterality Date  . AORTIC VALVE REPLACEMENT  2009   #31mm [facility] Ease pericardial valve (done same time is CABG)  . BREAST LUMPECTOMY WITH RADIOACTIVE SEED AND SENTINEL LYMPH NODE BIOPSY Right 01/18/2016   Procedure: RIGHT BREAST LUMPECTOMY WITH RADIOACTIVE SEED AND SENTINEL LYMPH NODE BIOPSY;  Surgeon: Alphonsa Overall, MD;  Location: [place];  Service: General;  Laterality: Right;  . CARDIAC CATHETERIZATION  03/23/2008   Pre-redo CABG: L main OK, LAD (T), CFX (T), OM1 99%, RCA (T), LIMA-LAD OK, SVG-OM(?3) OK w/ little florw to OM2, SVG-RCA OK. EF NL  . CARDIOVASCULAR STRESS TEST  06-15-2013      dr Debara Pickett   [facility] perfusion study/  no evidence ischemia/  normal wall motion, ef 72%  . CARPAL TUNNEL RELEASE    . COLONOSCOPY    . CORONARY ARTERY BYPASS GRAFT  1998 &  re-do 2009   Had LIMA to DX/LAD, SVG to 2 marginal branches and SVG to [facility] originally; SVG to 3rd OM and PD at time of redo  . CYSTOSCOPY W/ URETERAL STENT PLACEMENT Right 12/20/2014   Procedure: CYSTOSCOPY WITH RETROGRADE PYELOGRAM/URETERAL STENT PLACEMENT;  Surgeon: Cleon Gustin, MD;  Location: [facility];  Service: Urology;  Laterality: Right;  . EYE SURGERY Bilateral    cataracts  . HOLMIUM LASER APPLICATION Right 5/63/8756   Procedure:  HOLMIUM LASER LITHOTRIPSY;  Surgeon: Cleon Gustin, MD;  Location: [facility];  Service: Urology;  Laterality: Right;  . LEFT HEART CATHETERIZATION WITH CORONARY/GRAFT ANGIOGRAM N/A 02/23/2013   Procedure: LEFT HEART CATHETERIZATION WITH Beatrix Fetters;  Surgeon: Blane Ohara, MD;  Location: [facility] CATH LAB;  Service: Cardiovascular;  Laterality: N/A;  . PORTACATH PLACEMENT Left 01/18/2016   Procedure: INSERTION PORT-A-CATH;  Surgeon: Alphonsa Overall, MD;  Location: [place];  Service: General;  Laterality: Left;  . TONSILLECTOMY    . TRANSTHORACIC ECHOCARDIOGRAM   02-24-2013      mild LVH,  ef 50-55%/  AV bioprosthesis was present with very mild stenosis and no regurg., mean grandient 23mmHg, peak grandient 29mmHg /  mild MR/  mild LAE and RAE/  moderate TR  . TUBAL LIGATION      There were no vitals filed for this visit.      Subjective Assessment - 05/09/16 1022    Subjective Saw my cardiologist again since I've been here and she was pleased that I had lost 7 lbs of fluid in a week. Also got compression socks since I've been here and they are working great so we don't need to bandage anymore. Per daughter in law, pts gait and stability has improved since last week with fluid loss.   Pertinent History Diagnosed with right breast cancer and had two lumpectomies in August with sentinel lymph node biospy (negative).  Finished chemo in late October.  Hospitalized for left leg cellulitis the last week in October and just finished antibiotic.  Will start radiation in December (assessed 05/14/16).  Diabetes and has a pump; atrial fibrillation.  Neuropathy in feet, toes especially, mostly numbness and some pain.  h/o CABG and aortic valve replacement, the latter in 2008  h/o broken right shoulder eight years ago, and reports difficulty holding the right arm up.   Patient Stated Goals "to get where I can walk better and get around, and get back to my normal life"   Currently in Pain? Yes   Pain Score 1    Pain Location Leg  thighs/knees   Pain Orientation Right;Left   Pain Descriptors / Indicators Aching   Pain Type Chronic pain   Pain Onset More than a month ago   Aggravating Factors  lifting leg first thing getting out of bed in the morning   Pain Relieving Factors once I'm moving it's just an ache            [facility] PT Assessment - 05/09/16 0001      Berg Balance Test   Sit to Stand Able to stand without using hands and stabilize independently   Standing Unsupported Able to stand safely 2 minutes   Sitting with Back Unsupported but Feet Supported on  Floor or Stool Able to sit safely and securely 2 minutes   Stand to Sit Sits safely with minimal use of hands   Transfers Able to transfer safely, minor use of hands   Standing Unsupported with Eyes Closed Able to stand 10 seconds with supervision   Standing Ubsupported with Feet Together Able to place feet together independently and stand 1 minute safely   From Standing, Reach Forward with Outstretched Arm Can reach forward >12 cm safely (5")   From Standing  Position, Pick up Object from Floor Able to pick up shoe, needs supervision   From Standing Position, Turn to Look Behind Over each Shoulder Looks behind one side only/other side shows less weight shift   Turn 360 Degrees Able to turn 360 degrees safely one side only in 4 seconds or less   Standing Unsupported, Alternately Place Feet on Step/Stool Able to complete 4 steps without aid or supervision  8 steps in 22 seconds with 2 mild LOB    Standing Unsupported, One Foot in Front Able to plae foot ahead of the other independently and hold 30 seconds   Standing on One Leg Able to lift leg independently and hold equal to or more than 3 seconds   Total Score 46                     [facility] Adult PT Treatment/Exercise - 05/09/16 0001      Knee/Hip Exercises: Standing   Hip Flexion Stengthening;Both;5 reps  At counter with +2 HHA   Hip Abduction Stengthening;Both;10 reps  At counter with +2 HHA   Abduction Limitations Demonstration and VC for correct technique   Hip Extension Stengthening;Both;5 reps  At counter with +2 HHA   Extension Limitations Demonstration and VC for correct technique and to decrease forward lean     Knee/Hip Exercises: Seated   Long Arc Quad Strengthening;Both;5 reps  5 second holds   [facility] 10 times with bolster   Knee/Hip Flexion Marching 10 times each leg     Shoulder Exercises: Seated   Flexion Strengthening;Both;10 reps  Just to shoulder height   Flexion Limitations VC for erect posture    Abduction Strengthening;Both;10 reps  Just to shoulder height   ABduction Limitations VC for erect posture   Other Seated Exercises Bil scaption to shoulder height with VC for erect posture, 10 times   Other Seated Exercises Overhead alternating punches with VC to decrease scapular compensations Rt>Lt 10 times each                PT Education - 05/09/16 1128    Education provided Yes   Education Details Bil UE and bil LE strengthening exercises     Person(s) Educated Patient;Other (comment)  Daughter in law [place]   Methods Explanation;Demonstration;Handout   Comprehension Verbalized understanding;Returned demonstration;Need further instruction           Short Term Clinic Goals - 05/02/16 1226      CC Short Term Goal  #1   Title Patient will be independent in HEP including LE strengthening, right shoulder ROM   Time 3   Period Weeks   Status New     CC Short Term Goal  #2   Title Patient's family members will be knowledgeable about correct compression bandaging for both LEs   Time 3   Period Weeks   Status New             Long Term Clinic Goals - 05/09/16 1139      CC Long Term Goal  #1   Title Will report at least 50% decrease in LE pain   Status On-going     CC Long Term Goal  #2   Title Will report being able to unload the dishwasher without needing to stop to rest during the process   Status On-going     CC Long Term Goal  #3   Title will be independent in a home exercise program including strengthening and endurance  Baseline Issued initial HEP-05/09/16   Status On-going     CC Long Term Goal  #4   Title will show LE strength at least 4+/5 overall   Status On-going     CC Long Term Goal  #5   Title right shoulder active flexion and abduction to at least 100 degrees for improved reach and ADLs   Status On-going     CC Long Term Goal  #6   Title Lymphedema life impact scale impairment of 20% or less   Baseline score of 55 of 80%  impairment   Status On-going            Plan - 05/09/16 1129    Clinical Impression Statement Pt overall did very well today just reporting some LE fatigue at end of session. She scored a 46/56 on the BERG showing a signficant risk of falling. Explained results with pt and she reported feeling like she wouldn't have done this well with the activities last week, so feels she has improved in stability with water weight loss and is hopeful this will continue to improve. She did well with HEP issued today though did need multiple rest breaks due to muscle fatigue but reported feeling good after session, just LE muscle fatigue.    Rehab Potential Excellent   Clinical Impairments Affecting Rehab Potential none   PT Frequency 2x / week   PT Duration 6 weeks   PT Treatment/Interventions ADLs/Self Care Home Management;Electrical Stimulation;DME Instruction;Gait training;Stair training;Functional mobility training;Therapeutic exercise;Balance training;Neuromuscular re-education;Patient/family education;Manual techniques;Manual lymph drainage;Compression bandaging;Passive range of motion   PT Next Visit Plan Do DGI, or gait speed testing prn (BERG done today).  Cont strengthening of LEs, ROM with HEP for right shoulder; progress to endurance exercise and balance re-ed. Review HEP.   PT Home Exercise Plan see education section   Consulted and Agree with Plan of Care Patient      Patient will benefit from skilled therapeutic intervention in order to improve the following deficits and impairments:  Decreased activity tolerance, Decreased balance, Decreased endurance, Decreased knowledge of use of DME, Decreased range of motion, Decreased strength, Increased edema, Impaired sensation, Pain, Decreased knowledge of precautions  Visit Diagnosis: Generalized edema  Muscle weakness (generalized)  Unsteadiness on feet  Stiffness of right shoulder, not elsewhere classified     Problem List Patient  Active Problem List   Diagnosis Date Noted  . Chemotherapy-induced peripheral neuropathy ([place]) 04/27/2016  . 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 Cellulitis of left leg 04/06/2016  . Osteomyelitis of toe of left foot ([place]) 04/06/2016  . Antineoplastic chemotherapy induced anemia 03/09/2016  . Mania ([place]) 03/05/2016  . GERD (gastroesophageal reflux disease) 02/29/2016  . Chemotherapy induced nausea and vomiting 02/24/2016  . Port catheter in place 02/17/2016  . Breast cancer of upper-inner quadrant of right female breast ([place]) 12/16/2015  . Acute bronchitis 08/01/2015  . CKD (chronic kidney disease) 09/24/2013  . Chronic diastolic CHF (congestive heart failure) ([place]) 09/24/2013  . Acute on chronic combined systolic and diastolic CHF, NYHA class 3 ([place]) 07/17/2013  . Chest pain 06/15/2013  . Unstable angina ([place]) 06/14/2013  . Atrial tachycardia ([place]) 04/03/2013  . Non-ST elevation myocardial infarction (NSTEMI), initial episode of care ([place]) 02/23/2013  . CAD (coronary artery disease) 01/24/2011  . S/P aortic valve replacement 10/27/2010  . Hypertension   . Hyperlipidemia   . SOB (shortness of breath)   . CHF (congestive heart failure) ([place])   . Renal insufficiency, mild   . Dyspnea   .  Nonischemic cardiomyopathy ([place])   . Anemia   . Diabetes mellitus, type 2 ([place])   . Iron deficiency anemia   . Allergic rhinitis 10/14/2009  . COUGH 10/14/2009  . HYPERLIPIDEMIA 05/12/2009  . HYPERTENSION 05/12/2009  . Atrial fibrillation ([place]) 05/12/2009  . Asthma 05/12/2009  . DYSPNEA 05/12/2009    Otelia Limes, PTA 05/09/2016, 11:43 AM  [place], [place], [address] Phone: [phone]   Fax:  [phone]  Name: Hannah [name] MRN: [id number] Date of Birth: [birth year]

## 2016-05-09 NOTE — Patient Instructions (Signed)
Cancer Rehab [phone] Shoulder exercises:  3 way raises:  1. Raise arms in front to shoulder height 2. Raise arms slightly wider in a "V" to shoulder height 3. Raise arms out to sides to shoulder height  Also can do alternating overhead punches.  10 times each way keeping shoulders down throughout  KNEE: Extension, Long Arc Quads - Sitting    Raise leg until knee is straight. _5-10__ reps per set hold for 5 seconds, _2-3__ sets per day.     Hip (Front)    Begin sitting tall, both feet flat on floor. Inhale, then exhale while lifting knee as high as is comfortable, keeping upper body straight and still. Slowly return to starting position. Repeat _10-20___ times each leg doing slow and controlled. Do _2-3___ sessions per day.   Copyright  VHI. All rights reserved.   ABDUCTION: Standing (Active)    Stand, feet flat. Lift right leg out to side without leaning over and keep toes pointed forward. Slow and controlled. Then do with other leg.  Also do front leg swings slow and controlled, and then back leg swings until you feel butt muscle contracting and without leaning forward.  Complete _1-2__ sets of _5-10__ repetitions. Perform _2-3__ sessions per day.     [email or web address].exer.[email or web address]   Copyright  VHI. All rights reserved.

## 2016-05-10 ENCOUNTER — Ambulatory Visit (INDEPENDENT_AMBULATORY_CARE_PROVIDER_SITE_OTHER): Payer: Medicare Other | Admitting: Endocrinology

## 2016-05-10 ENCOUNTER — Other Ambulatory Visit: Payer: Self-pay

## 2016-05-10 ENCOUNTER — Encounter: Payer: Self-pay | Admitting: Endocrinology

## 2016-05-10 VITALS — BP 140/78 | HR 80 | Wt 211.4 lb

## 2016-05-10 DIAGNOSIS — Z794 Long term (current) use of insulin: Secondary | ICD-10-CM

## 2016-05-10 DIAGNOSIS — E860 Dehydration: Secondary | ICD-10-CM

## 2016-05-10 DIAGNOSIS — E1165 Type 2 diabetes mellitus with hyperglycemia: Secondary | ICD-10-CM

## 2016-05-10 LAB — POCT GLUCOSE (DEVICE FOR HOME USE): POC Glucose: 252 mg/dl — AB (ref 70–99)

## 2016-05-10 MED ORDER — INSULIN LISPRO 100 UNIT/ML (KWIKPEN): PEN_INJECTOR | SUBCUTANEOUS | 2 refills | Status: DC

## 2016-05-10 MED ORDER — INSULIN LISPRO 100 UNIT/ML ~~LOC~~ SOLN: SUBCUTANEOUS | 2 refills | Status: DC

## 2016-05-10 NOTE — Progress Notes (Signed)
Patient ID: Hannah [name], female   DOB: [birth year], 70 y.o.   MRN: [id number]           Reason for Appointment: Follow-up for Type 2 Diabetes  Referring physician: Suzy Bouchard  History of Present Illness:          Date of diagnosis of type 2 diabetes mellitus: Late 1980s        Background history:  She was previously treated with metformin prior to starting insulin She thinks she has been on insulin for at least 10 years and mostly taking Lantus and other types of insulin with it Her metformin was stopped when she started insulin and also has had renal dysfunction Previously her A1c had been consistently over 8% and occasionally higher.  Recent history:   INSULIN regimen is described as:  V-go 40 basal, mealtime coverage 8 u ac B and 8-12 acL, 12-14 acS  She has been on the V-go pump since 03/2015  Most recent A1c is down to 6.4, previously 6.5  Current blood sugar patterns and problems identified:   She was told to change her pump to 30 units because of low readings in the mornings  However she did not try the sample and was given for no special reason and also has not been seen in over 4 months now  She says her blood sugars have been at times lower again because of variable appetite with chemotherapy  However still appears to have periodic readings in the 50s and 60s in the morning and occasionally in the 60s in the evenings  Her lowest reading was 57 a few days ago in the morning but may have been excessive insulin by before when blood sugar was 401  She has not checked readings much later in the day and mostly doing it in the morning  Overall fasting readings have been somewhat variable  Now she is having difficulty with the skin on her abdomen peeling off when she removes her insulin pump and is also having issues with thinning of the skin in general  She also has more variability in blood sugars later at night; occasionally does take extra insulin with the Humalog pen  if needing more insulin in the evening which has not been a problem usually  Today her pump appears not to be activated as blood sugars are consistently high and she has a bubble in her pump    Oral hypoglycemic drugs the patient is taking are: None On Trulicity 1.06 mg weekly  Side effects from medications have been: None  Compliance with the medical regimen: Fair  Glucose monitoring:  done about 3 times a day         Glucometer:  Accu-Chek Aviva Blood Glucose readings by download for the last 4 weeks:  Mean values apply above for all meters except median for One Touch  PRE-MEAL Fasting Lunch Dinner Bedtime Overall  Glucose range: 57-206    66-401    Mean/median: 115    200  143     Self-care: The diet that the patient has been following is: tries to limit fats and carbs.  Eating out periodically She uses diet green tea and no sugar in drinks   Lunch 1-2 pm Dinner 6-7 PM; Hs snack   Typical meal intake: Breakfast is eggs usually             Dietician visit, most recent: 5/16 And in 7/16 with oncology dietitian  Exercise:  none recently  Weight history:  Wt Readings from Last 3 Encounters:  05/10/16 211 lb 6.4 oz (95.9 kg)  05/08/16 214 lb 1.9 oz (97.1 kg)  05/01/16 221 lb (100.2 kg)    Glycemic control:      Lab Results  Component Value Date   HGBA1C 6.4 05/08/2016   HGBA1C 6.5 01/05/2016   HGBA1C 7.8 (H) 09/19/2015   Lab Results  Component Value Date   MICROALBUR 24.3 (H) 09/19/2015   LDLCALC 58 07/05/2015   CREATININE 1.45 (H) 05/08/2016         Medication List       Accurate as of 05/10/16  1:58 PM. Always use your most recent med list.          ACCU-CHEK AVIVA PLUS test strip Generic drug:  glucose blood   alendronate 70 MG tablet Commonly known as:  FOSAMAX Take 70 mg by mouth once a week. Take with a full glass of water on an empty stomach.   aspirin 81 MG EC tablet Take 1 tablet (81 mg total) by mouth daily.     budesonide-formoterol 160-4.5 MCG/ACT inhaler Commonly known as:  SYMBICORT INHALE TWO PUFFS INTO LUNGS TWICE DAILY   cephALEXin 500 MG capsule Commonly known as:  KEFLEX Take 1 capsule (500 mg total) by mouth 2 (two) times daily.   clopidogrel 75 MG tablet Commonly known as:  PLAVIX Take 75 mg by mouth daily.   docusate sodium 100 MG capsule Commonly known as:  COLACE Take 1 capsule (100 mg total) by mouth daily as needed for mild constipation.   furosemide 80 MG tablet Commonly known as:  LASIX Take 1 tablet (80 mg total) by mouth daily. 1 tablet in the AM and 1/2 tablet (40 mg) in the early afternoon.   gabapentin 300 MG capsule Commonly known as:  NEURONTIN Take 1 capsule (300 mg total) by mouth 3 (three) times daily.   insulin lispro 100 UNIT/ML KiwkPen Commonly known as:  HUMALOG KWIKPEN INJECT 10 UNITS INTO THE SKIN WITH MEALS PLUS SLIDING SCALE   insulin lispro 100 UNIT/ML injection Commonly known as:  HUMALOG Inject 76 units daily with  V-GO pump   lidocaine-prilocaine cream Commonly known as:  EMLA Apply to affected area once   loratadine 10 MG tablet Commonly known as:  CLARITIN Take 10 mg by mouth daily.   LORazepam 2 MG tablet Commonly known as:  ATIVAN Take 0.5 tablets (1 mg total) by mouth at bedtime.   metoprolol succinate 100 MG 24 hr tablet Commonly known as:  TOPROL-XL Take 1 tablet (100 mg total) by mouth 2 (two) times daily. Take with or immediately following a meal.   omeprazole 20 MG tablet Commonly known as:  PRILOSEC OTC Take 20 mg by mouth daily as needed (heartburn).   potassium chloride SA 20 MEQ tablet Commonly known as:  KLOR-CON M20 Take 1 tablet (20 mEq total) by mouth 2 (two) times daily.   rosuvastatin 40 MG tablet Commonly known as:  CRESTOR TAKE ONE TABLET BY MOUTH ONCE DAILY   TraMADol HCl 200 MG Cp24 Take 200 mg by mouth as needed.   TRULICITY 1.66 AY/3.0ZS Sopn Generic drug:  Dulaglutide Inject 0.75 mg into the  skin every Wednesday.   ZETIA 10 MG tablet Generic drug:  ezetimibe Take 1 tablet by mouth  daily       Allergies:  Allergies  Allergen Reactions  . Amoxicillin Rash    Has patient had a PCN reaction  causing immediate rash, facial/tongue/throat swelling, SOB or lightheadedness with hypotension: No Has patient had a PCN reaction causing severe rash involving mucus membranes or skin necrosis: Yes Has patient had a PCN reaction that required hospitalization No Has patient had a PCN reaction occurring within the last 10 years: No If all of the above answers are "NO", then may proceed with Cephalosporin use.   . Imdur [Isosorbide Dinitrate] Other (See Comments)    Headache/severe hypotension/Syncope  . Latex Itching  . Tetracycline Rash    Past Medical History:  Diagnosis Date  . Abnormally small mouth   . Anemia   . Bilateral carotid artery stenosis    Bilateral ICA 40-59%/  >50% LECA  . Breast cancer ([place])   . Breast cancer of upper-inner quadrant of right female breast ([place]) 12/16/2015  . Bruises easily   . CAD (coronary artery disease) cardiologist-  dr Aundra Dubin   Remote CABG in 1998 with redo in 2009  . CHF (congestive heart failure) (HCC)    EF is low normal at 50 to 55% per echo in Jan. 2011  . Dyspnea   . GERD (gastroesophageal reflux disease)   . Hearing loss of right ear   . History of non-ST elevation myocardial infarction (NSTEMI)    Sept 2014--  thought to be type II HTN w/ LHC without infarct related artery and patent grafts  . Hyperlipidemia   . Hypertension   . Iron deficiency anemia   . Moderate persistent asthma    pulmologist-  Dr. Malvin Johns  . Nausea & vomiting    related to stone  . Nonischemic cardiomyopathy ([place])   . PAF (paroxysmal atrial fibrillation) ([place])   . PONV (postoperative nausea and vomiting)    pt states patch worked well before  . Psoriasis    right leg  . Renal calculus, right   . Renal insufficiency, mild   . S/P AVR    prosthesis  valve placement 2009 at same time re-do CABG  . Stroke [facility])    residual rt hearing loss  . Type 2 diabetes mellitus ([place])    monitored by dr Dwyane Dee    Past Surgical History:  Procedure Laterality Date  . AORTIC VALVE REPLACEMENT  2009   #51mm [facility] Ease pericardial valve (done same time is CABG)  . BREAST LUMPECTOMY WITH RADIOACTIVE SEED AND SENTINEL LYMPH NODE BIOPSY Right 01/18/2016   Procedure: RIGHT BREAST LUMPECTOMY WITH RADIOACTIVE SEED AND SENTINEL LYMPH NODE BIOPSY;  Surgeon: Alphonsa Overall, MD;  Location: [place];  Service: General;  Laterality: Right;  . CARDIAC CATHETERIZATION  03/23/2008   Pre-redo CABG: L main OK, LAD (T), CFX (T), OM1 99%, RCA (T), LIMA-LAD OK, SVG-OM(?3) OK w/ little florw to OM2, SVG-RCA OK. EF NL  . CARDIOVASCULAR STRESS TEST  06-15-2013      dr Debara Pickett   [facility] perfusion study/  no evidence ischemia/  normal wall motion, ef 72%  . CARPAL TUNNEL RELEASE    . COLONOSCOPY    . CORONARY ARTERY BYPASS GRAFT  1998 &  re-do 2009   Had LIMA to DX/LAD, SVG to 2 marginal branches and SVG to [facility] originally; SVG to 3rd OM and PD at time of redo  . CYSTOSCOPY W/ URETERAL STENT PLACEMENT Right 12/20/2014   Procedure: CYSTOSCOPY WITH RETROGRADE PYELOGRAM/URETERAL STENT PLACEMENT;  Surgeon: Cleon Gustin, MD;  Location: [facility];  Service: Urology;  Laterality: Right;  . EYE SURGERY Bilateral    cataracts  . HOLMIUM LASER  APPLICATION Right 3/61/4431   Procedure:  HOLMIUM LASER LITHOTRIPSY;  Surgeon: Cleon Gustin, MD;  Location: [facility];  Service: Urology;  Laterality: Right;  . LEFT HEART CATHETERIZATION WITH CORONARY/GRAFT ANGIOGRAM N/A 02/23/2013   Procedure: LEFT HEART CATHETERIZATION WITH Beatrix Fetters;  Surgeon: Blane Ohara, MD;  Location: [facility] CATH LAB;  Service: Cardiovascular;  Laterality: N/A;  . PORTACATH PLACEMENT Left 01/18/2016   Procedure: INSERTION PORT-A-CATH;  Surgeon: Alphonsa Overall, MD;   Location: [place];  Service: General;  Laterality: Left;  . TONSILLECTOMY    . TRANSTHORACIC ECHOCARDIOGRAM  02-24-2013      mild LVH,  ef 50-55%/  AV bioprosthesis was present with very mild stenosis and no regurg., mean grandient 55mmHg, peak grandient 43mmHg /  mild MR/  mild LAE and RAE/  moderate TR  . TUBAL LIGATION      Family History  Problem Relation Age of Onset  . Heart disease Mother   . Heart disease Father   . Heart failure Father   . Diabetes Maternal Grandmother   . Diabetes Son     Social History:  reports that she has never smoked. She has never used smokeless tobacco. She reports that she does not drink alcohol or use drugs.    Review of Systems    Lipid history: On 40 mg Crestor along with Zetia since she has had her coronary artery disease     Lab Results  Component Value Date   CHOL 116 (L) 07/05/2015   HDL 38 (L) 07/05/2015   LDLCALC 58 07/05/2015   LDLDIRECT 109.2 03/01/2014   TRIG 99 07/05/2015   CHOLHDL 3.1 07/05/2015           Eyes:   Most recent eye exam was In 2017 and was not told to have any retinopathy, Report not available  Hypertension: Blood pressure is well controlled now without any specific medications except metoprolol and Lasix  She is having more edema, followed by cardiologist  Diabetic foot exam in 5/16 shows She has complete loss of monofilament sensation in her distal feet with only mild sensitivity present on the right first and second toes  Physical Examination:  BP 140/78   Pulse 80   Wt 211 lb 6.4 oz (95.9 kg)   BMI 35.18 kg/m    ASSESSMENT:  Diabetes type 2, uncontrolled   See history of present illness for detailed discussion of  current management, blood sugar patterns and problems identified  Although her A1c is 6.4 she has tendency to low readings in the mornings periodically and at bedtime high readings after meals This is despite using Trulicity also She is likely needing less basal insulin Occasionally  having issues with the pump not working and sporadic high readings Also now not able to wear the pump easily because of fragile skin She has variability and postprandial readings which she is not monitoring much [place] based on her diet and compliance carbohydrate intake   PLAN:   She was switched to the 30 unit pump  She will try to use the pump on her upper arm  No change in boluses  She will need to do much more readings after meals and less in the morning to help assess her mealtime requirement   Stay on Trulicity, consider increasing the dose to 1.5  She will check with the company to get a new Accu-Chek meter, currently using Accu-Chek Aviva   Patient Instructions  Check blood sugars on waking up  4x  weekly  Also check blood sugars about 2 hours after a meal and do this after different meals by rotation  Recommended blood sugar levels on waking up is 90-130 and about 2 hours after meal is 130-160  Please bring your blood sugar monitor to each visit, thank you  Use 30 unit pump    Counseling time on subjects discussed above is over 50% of today's 25 minute visit   Kallie Depolo 05/10/2016, 1:58 PM   Note: This office note was prepared with Dragon voice recognition system technology. Any transcriptional errors that result from this process are unintentional.

## 2016-05-10 NOTE — Addendum Note (Signed)
Addended by: Inocencio Homes on: 05/10/2016 04:26 PM   Modules accepted: Orders

## 2016-05-10 NOTE — Patient Instructions (Addendum)
Check blood sugars on waking up  4x weekly  Also check blood sugars about 2 hours after a meal and do this after different meals by rotation  Recommended blood sugar levels on waking up is 90-130 and about 2 hours after meal is 130-160  Please bring your blood sugar monitor to each visit, thank you  Use 30 unit pump

## 2016-05-11 NOTE — Progress Notes (Signed)
Location of Breast Cancer: upper-inner quadrant of right breast   Histology per Pathology Report:   01/18/16 Diagnosis 1. Breast, lumpectomy, Right - MULTIFOCAL INVASIVE DUCTAL CARCINOMA, GRADE 3, SPANNING 1.9 CM AND 0.8 CM. - DUCTAL CARCINOMA IN SITU, HIGH GRADE. - BIOPSY SITE CHANGES. - RESECTION MARGINS ARE NEGATIVE. - SEE ONCOLOGY TABLE. 2. Breast, excision, New inferior margin right - FIBROCYSTIC CHANGE. - FIBROADENOMATOID CHANGE WITH CALCIFICATIONS. - NO MALIGNANCY IDENTIFIED. 3. Lymph node, sentinel, biopsy, Right axillary - ONE OF ONE LYMPH NODES NEGATIVE FOR CARCINOMA (0/1).  12/14/15 Diagnosis 1. Breast, lumpectomy, Right - MULTIFOCAL INVASIVE DUCTAL CARCINOMA, GRADE 3, SPANNING 1.9 CM AND 0.8 CM. - DUCTAL CARCINOMA IN SITU, HIGH GRADE. - BIOPSY SITE CHANGES. - RESECTION MARGINS ARE NEGATIVE. - SEE ONCOLOGY TABLE. 2. Breast, excision, New inferior margin right - FIBROCYSTIC CHANGE. - FIBROADENOMATOID CHANGE WITH CALCIFICATIONS. - NO MALIGNANCY IDENTIFIED. 3. Lymph node, sentinel, biopsy, Right axillary - ONE OF ONE LYMPH NODES NEGATIVE FOR CARCINOMA (0/1).  Right breast biopsy 12:30 position: 2 masses, 1.6 cm mass: Invasive ductal carcinoma, grade 2, ER 0%, PR 0%, HER-2 negative, Ki-67 70%; satellite mass 8 mm: IDC grade 2 ER 5%, PR 5%, HER-2 negative, Ki-67 50%;   Did patient present with symptoms (if so, please note symptoms) or was this found on screening mammography?: screening mammogram  Past/Anticipated interventions by surgeon, if any: 01/18/16 - Procedure: RIGHT BREAST LUMPECTOMY WITH RADIOACTIVE SEED AND SENTINEL LYMPH NODE BIOPSY;  Surgeon: Alphonsa Overall, MD  Past/Anticipated interventions by medical oncology, if any: Plan per Dr. Lindi Adie is for adjuvant chemotherapy with Taxotere and Cytoxan (3 cycles). Adriamycin is not being given because of her cardiac problems.  Followed by adjuvant radiationfollowed by adjuvant antiestrogen therapy (because a satellite tumor  is weakly ER and PR positive).  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 no  Pacemaker/ICD? no  Possible current pregnancy?no  Is the patient on methotrexate? no  Current Complaints / other details:  Patient is here with her husband.  BP (!) 128/54 (BP Location: Right Arm, Patient Position: Sitting)   Pulse 78   Temp 97.9 F (36.6 C) (Oral)   Ht 5' 5"  (1.651 m)   Wt 212 lb 9.6 oz (96.4 kg)   SpO2 100%   BMI 35.38 kg/m    Wt Readings from Last 3 Encounters:  05/14/16 212 lb 9.6 oz (96.4 kg)  05/10/16 211 lb 6.4 oz (95.9 kg)  05/08/16 214 lb 1.9 oz (97.1 kg)

## 2016-05-14 ENCOUNTER — Ambulatory Visit
Admission: RE | Admit: 2016-05-14 | Discharge: 2016-05-14 | Disposition: A | Discharge status: Home / Self Care | Payer: Medicare Other | Source: Ambulatory Visit | Attending: Radiation Oncology | Admitting: Radiation Oncology

## 2016-05-14 ENCOUNTER — Telehealth: Payer: Self-pay | Admitting: *Deleted

## 2016-05-14 ENCOUNTER — Encounter: Payer: Self-pay | Admitting: Radiation Oncology

## 2016-05-14 DIAGNOSIS — Z510 Encounter for antineoplastic radiation therapy: Secondary | ICD-10-CM | POA: Insufficient documentation

## 2016-05-14 DIAGNOSIS — Z7982 Long term (current) use of aspirin: Secondary | ICD-10-CM | POA: Insufficient documentation

## 2016-05-14 DIAGNOSIS — Z794 Long term (current) use of insulin: Secondary | ICD-10-CM | POA: Insufficient documentation

## 2016-05-14 DIAGNOSIS — Z79899 Other long term (current) drug therapy: Secondary | ICD-10-CM | POA: Insufficient documentation

## 2016-05-14 DIAGNOSIS — Z170 Estrogen receptor positive status [ER+]: Secondary | ICD-10-CM | POA: Insufficient documentation

## 2016-05-14 DIAGNOSIS — Z881 Allergy status to other antibiotic agents status: Secondary | ICD-10-CM | POA: Diagnosis not present

## 2016-05-14 DIAGNOSIS — Z7983 Long term (current) use of bisphosphonates: Secondary | ICD-10-CM | POA: Diagnosis not present

## 2016-05-14 DIAGNOSIS — C50211 Malignant neoplasm of upper-inner quadrant of right female breast: Secondary | ICD-10-CM | POA: Insufficient documentation

## 2016-05-14 DIAGNOSIS — Z888 Allergy status to other drugs, medicaments and biological substances status: Secondary | ICD-10-CM | POA: Diagnosis not present

## 2016-05-14 DIAGNOSIS — Z91040 Latex allergy status: Secondary | ICD-10-CM | POA: Diagnosis not present

## 2016-05-14 DIAGNOSIS — Z9641 Presence of insulin pump (external) (internal): Secondary | ICD-10-CM | POA: Diagnosis not present

## 2016-05-14 DIAGNOSIS — Z9221 Personal history of antineoplastic chemotherapy: Secondary | ICD-10-CM | POA: Insufficient documentation

## 2016-05-14 DIAGNOSIS — Z7902 Long term (current) use of antithrombotics/antiplatelets: Secondary | ICD-10-CM | POA: Diagnosis not present

## 2016-05-14 NOTE — Progress Notes (Signed)
Radiation Oncology         [phone] ________________________________  Name: Hannah [name] MRN: [id number]  Date: 05/14/2016  DOB: 1945/11/16  Re-Evaluation Visit Note  CC: Julien Nordmann, MD    ICD-9-CM ICD-10-CM   1. Malignant neoplasm of upper-inner quadrant of right breast in female, estrogen receptor positive ([place]) 174.2 C50.211    V86.0 Z17.0     Diagnosis: Stage IA (pT1c(m), pN0) grade 3 IDC and high grade DCIS of the right breast (Main Mass: triple negative) (Satellite Mass: ER 5%+, PR 2%+, HER2-)  Chief Complaint: Discuss radiation for right breast cancer.  Interval Since Last Radiation: N/A  Narrative:  The patient returns today for a re-evaluation. The patient was seen in breast clinic on 12/21/15.  On 01/18/16, the patient had a right lumpectomy and sentinel lymph node biopsy. Right lumpectomy revealed multifocal invasive ductal carcinoma, grade 3, spanning 1.9 and 0.8 cm, high grade DCIS, and the resection margins were negative. Excision of the new inferior right margin revealed fibrocystic change, fibroadenomatoid change with califications, and no malignancy. 0/1 right axillary sentinel lymph nodes were negative for carcinoma. The main mass was triple negative and Ki-67 70%. The satellite mass was ER 5% +, PR 2% +, HER2 -, and Ki67 50%.  The patient underwent adjuvant chemotherapy with Taxotere and Cytoxan x3 that was stopped early due to neuropathy and recurrent cellulitis of the legs (02/17/16 - 04/06/16). The patient was hospitalized for cellulitis from 04/08/16 - 04/23/16. Adriamycin was not given due to her cardiac problems.  The patient presents today to discuss adjuvant radiation therapy to the right breast. She is accompanied by her husband.  On review of systems: The patient reports mild pain from neuropathy in her legs. She reports some difficulty raising her right arm due to breaking her shoulder from a fall 3-4 years ago.  ALLERGIES:  is  allergic to amoxicillin; imdur [isosorbide dinitrate]; latex; and tetracycline.  Meds: Current Outpatient Prescriptions  Medication Sig Dispense Refill  . ACCU-CHEK AVIVA PLUS test strip     . alendronate (FOSAMAX) 70 MG tablet Take 70 mg by mouth once a week. Take with a full glass of water on an empty stomach.    Marland Kitchen aspirin EC 81 MG EC tablet Take 1 tablet (81 mg total) by mouth daily.    . budesonide-formoterol (SYMBICORT) 160-4.5 MCG/ACT inhaler INHALE TWO PUFFS INTO LUNGS TWICE DAILY 1 Inhaler 11  . clopidogrel (PLAVIX) 75 MG tablet Take 75 mg by mouth daily.    . furosemide (LASIX) 80 MG tablet Take 1 tablet (80 mg total) by mouth daily. 1 tablet in the AM and 1/2 tablet (40 mg) in the early afternoon. 90 tablet 3  . gabapentin (NEURONTIN) 300 MG capsule Take 1 capsule (300 mg total) by mouth 3 (three) times daily. 90 capsule 3  . insulin lispro (HUMALOG KWIKPEN) 100 UNIT/ML KiwkPen INJECT 10 UNITS INTO THE SKIN WITH MEALS PLUS SLIDING SCALE 15 mL 2  . insulin lispro (HUMALOG) 100 UNIT/ML injection Inject 76 units daily with  V-GO pump 70 mL 2  . lidocaine-prilocaine (EMLA) cream Apply to affected area once 30 g 3  . loratadine (CLARITIN) 10 MG tablet Take 10 mg by mouth daily.    . metoprolol succinate (TOPROL-XL) 100 MG 24 hr tablet Take 1 tablet (100 mg total) by mouth 2 (two) times daily. Take with or immediately following a meal. 60 tablet 0  . potassium chloride SA (KLOR-CON M20) 20  MEQ tablet Take 1 tablet (20 mEq total) by mouth 2 (two) times daily. 60 tablet 3  . rosuvastatin (CRESTOR) 40 MG tablet TAKE ONE TABLET BY MOUTH ONCE DAILY 30 tablet 11  . TraMADol HCl 200 MG CP24 Take 200 mg by mouth as needed.     . TRULICITY 0.35 DH/7.4BU SOPN Inject 0.75 mg into the skin every Wednesday.     Marland Kitchen ZETIA 10 MG tablet Take 1 tablet by mouth  daily 90 tablet 2  . docusate sodium (COLACE) 100 MG capsule Take 1 capsule (100 mg total) by mouth daily as needed for mild constipation. (Patient  not taking: Reported on 05/14/2016) 10 capsule 0  . LORazepam (ATIVAN) 2 MG tablet Take 0.5 tablets (1 mg total) by mouth at bedtime. (Patient not taking: Reported on 05/14/2016) 60 tablet 1  . omeprazole (PRILOSEC OTC) 20 MG tablet Take 20 mg by mouth daily as needed (heartburn).      No current facility-administered medications for this encounter.     Physical Findings: The patient is in no acute distress. Patient is alert and oriented.  height is 5' 5"  (1.651 m) and weight is 212 lb 9.6 oz (96.4 kg). Her oral temperature is 97.9 F (36.6 C). Her blood pressure is 128/54 (abnormal) and her pulse is 78. Her oxygen saturation is 100%. .  Lungs are clear to auscultation bilaterally. Heart has regular rate and rhythm. No palpable cervical, supraclavicular, or axillary adenopathy. Left breast no palpable mass or nipple discharge. Port-a-cath in the upper left breast. Right breast lumpectomy scar in the upper central portion, no palpable mass or nipple discharge in the right breast. Right axilla, the patient appears to have a seroma measuring app 5 x 4 cm.  Lab Findings: Lab Results  Component Value Date   WBC 7.4 05/08/2016   HGB 9.5 (L) 05/08/2016   HCT 30.3 (L) 05/08/2016   MCV 96.2 05/08/2016   PLT 173 05/08/2016    Radiographic Findings: Dg Chest 2 View  Result Date: 04/25/2016 CLINICAL DATA:  Dyspnea on exertion for 2 days. EXAM: CHEST  2 VIEW COMPARISON:  04/09/2016 FINDINGS: Lungs are hyperexpanded. Stable appearance tiny right pleural effusion. Left pleural effusions seen previously has resolved. The cardio pericardial silhouette is enlarged. Interstitial markings are diffusely coarsened with chronic features. Left Port-A-Cath tip overlies the distal SVC. Patient is status post CABG. Bones are diffusely demineralized. IMPRESSION: Cardiomegaly with emphysema. Tiny right pleural effusion. Electronically Signed   By: Misty Stanley M.D.   On: 04/25/2016 17:23    Impression:  Stage IA  (pT1c(m), pN0) grade 3 IDC and high grade DCIS of the right breast (Main Mass: triple negative) (Satellite Mass: ER 5%+, PR 2%+, HER2-)  The patient would be a good candidate for adjuvant radiation therapy to the right breast.  I spoke to the patient today regarding her diagnosis and options for treatment. We discussed the equivalence in terms of survival and local failure between mastectomy and breast conservation. We discussed the role of radiation in decreasing local failures in patients who undergo lumpectomy. We discussed the process of CT simulation and the placement tattoos. We discussed 6 weeks of treatment as an outpatient. We discussed the possibility of asymptomatic lung damage. We discussed the low likelihood of secondary malignancies. We discussed the possible side effects including but not limited to skin redness, fatigue, permanent skin darkening, and breast swelling.   Plan: The patient signed a consent form and a copy was placed in her medical chart.  CT simulation is scheduled on 05/16/16 at 1PM.  ____________________________________ -----------------------------------  Blair Promise, PhD, MD  This document serves as a record of services personally performed by Gery Pray, MD. It was created on his behalf by Darcus Austin, a trained medical scribe. The creation of this record is based on the scribe's personal observations and the provider's statements to them. This document has been checked and approved by the attending provider.

## 2016-05-14 NOTE — Telephone Encounter (Signed)
Called patient to inform of PET Scan for 05-17-16 - arrival time - 7 am @ [facility], pt. To be NPO aftermidnight, lvm for a return call

## 2016-05-15 ENCOUNTER — Ambulatory Visit: Payer: Medicare Other | Attending: Hematology and Oncology

## 2016-05-15 DIAGNOSIS — R2681 Unsteadiness on feet: Secondary | ICD-10-CM | POA: Diagnosis present

## 2016-05-15 DIAGNOSIS — M25611 Stiffness of right shoulder, not elsewhere classified: Secondary | ICD-10-CM

## 2016-05-15 DIAGNOSIS — M6281 Muscle weakness (generalized): Secondary | ICD-10-CM

## 2016-05-15 DIAGNOSIS — R601 Generalized edema: Secondary | ICD-10-CM

## 2016-05-15 NOTE — Therapy (Signed)
[place], [place], [address] Phone: [phone]   Fax:  [phone]  Physical Therapy Treatment  Patient Details  Name: Hannah [name] MRN: [id number] Date of Birth: 04-12-1946 Referring Provider: Dr. Nicholas Lose  Encounter Date: 05/15/2016      PT End of Session - 05/15/16 1025    Visit Number 3   Number of Visits 13   Date for PT Re-Evaluation 06/15/16   PT Start Time 0937   PT Stop Time 4235  Did not have a 1015 and was going to treat pt longer but she wanted to stop due to fatigue   PT Time Calculation (min) 40 min   Activity Tolerance Patient tolerated treatment well   Behavior During Therapy [facility] for tasks assessed/performed      Past Medical History:  Diagnosis Date  . Abnormally small mouth   . Anemia   . Bilateral carotid artery stenosis    Bilateral ICA 40-59%/  >50% LECA  . Breast cancer ([place])   . Breast cancer of upper-inner quadrant of right female breast ([place]) 12/16/2015  . Bruises easily   . CAD (coronary artery disease) cardiologist-  dr Aundra Dubin   Remote CABG in 1998 with redo in 2009  . CHF (congestive heart failure) (HCC)    EF is low normal at 50 to 55% per echo in Jan. 2011  . Dyspnea   . GERD (gastroesophageal reflux disease)   . Hearing loss of right ear   . History of non-ST elevation myocardial infarction (NSTEMI)    Sept 2014--  thought to be type II HTN w/ LHC without infarct related artery and patent grafts  . Hyperlipidemia   . Hypertension   . Iron deficiency anemia   . Moderate persistent asthma    pulmologist-  Dr. Malvin Johns  . Nausea & vomiting    related to stone  . Nonischemic cardiomyopathy ([place])   . PAF (paroxysmal atrial fibrillation) ([place])   . PONV (postoperative nausea and vomiting)    pt states patch worked well before  . Psoriasis    right leg  . Renal calculus, right   . Renal insufficiency, mild   . S/P AVR    prosthesis valve placement 2009  at same time re-do CABG  . Stroke [facility])    residual rt hearing loss  . Type 2 diabetes mellitus ([place])    monitored by dr Dwyane Dee    Past Surgical History:  Procedure Laterality Date  . AORTIC VALVE REPLACEMENT  2009   #89mm [facility] Ease pericardial valve (done same time is CABG)  . BREAST LUMPECTOMY WITH RADIOACTIVE SEED AND SENTINEL LYMPH NODE BIOPSY Right 01/18/2016   Procedure: RIGHT BREAST LUMPECTOMY WITH RADIOACTIVE SEED AND SENTINEL LYMPH NODE BIOPSY;  Surgeon: Alphonsa Overall, MD;  Location: [place];  Service: General;  Laterality: Right;  . CARDIAC CATHETERIZATION  03/23/2008   Pre-redo CABG: L main OK, LAD (T), CFX (T), OM1 99%, RCA (T), LIMA-LAD OK, SVG-OM(?3) OK w/ little florw to OM2, SVG-RCA OK. EF NL  . CARDIOVASCULAR STRESS TEST  06-15-2013      dr Debara Pickett   [facility] Adam perfusion study/  no evidence ischemia/  normal wall motion, ef 72%  . CARPAL TUNNEL RELEASE    . COLONOSCOPY    . CORONARY ARTERY BYPASS GRAFT  1998 &  re-do 2009   Had LIMA to DX/LAD, SVG to 2 marginal branches and SVG to [facility] originally; SVG to 3rd OM and PD at  time of redo  . CYSTOSCOPY W/ URETERAL STENT PLACEMENT Right 12/20/2014   Procedure: CYSTOSCOPY WITH RETROGRADE PYELOGRAM/URETERAL STENT PLACEMENT;  Surgeon: Cleon Gustin, MD;  Location: [facility];  Service: Urology;  Laterality: Right;  . EYE SURGERY Bilateral    cataracts  . HOLMIUM LASER APPLICATION Right 2/94/7654   Procedure:  HOLMIUM LASER LITHOTRIPSY;  Surgeon: Cleon Gustin, MD;  Location: [facility];  Service: Urology;  Laterality: Right;  . LEFT HEART CATHETERIZATION WITH CORONARY/GRAFT ANGIOGRAM N/A 02/23/2013   Procedure: LEFT HEART CATHETERIZATION WITH Beatrix Fetters;  Surgeon: Blane Ohara, MD;  Location: [facility] CATH LAB;  Service: Cardiovascular;  Laterality: N/A;  . PORTACATH PLACEMENT Left 01/18/2016   Procedure: INSERTION PORT-A-CATH;  Surgeon: Alphonsa Overall, MD;  Location: [place];  Service:  General;  Laterality: Left;  . TONSILLECTOMY    . TRANSTHORACIC ECHOCARDIOGRAM  02-24-2013      mild LVH,  ef 50-55%/  AV bioprosthesis was present with very mild stenosis and no regurg., mean grandient 42mmHg, peak grandient 41mmHg /  mild MR/  mild LAE and RAE/  moderate TR  . TUBAL LIGATION      There were no vitals filed for this visit.      Subjective Assessment - 05/15/16 0945    Subjective I've lost about 3 more pounds since last week I think of fluid. My legs have been feeling much better and even my knees aren't hurting any more with the fluid reduction. Been doing the HEP and it's going okay. Doing them at least 2x/day. I've been out and about more and walking more and feeling better doing it. My husband says I'm getting around so much better too.   Pertinent History Diagnosed with right breast cancer and had two lumpectomies in August with sentinel lymph node biospy (negative).  Finished chemo in late October.  Hospitalized for left leg cellulitis the last week in October and just finished antibiotic.  Will start radiation in December (assessed 05/14/16).  Diabetes and has a pump; atrial fibrillation.  Neuropathy in feet, toes especially, mostly numbness and some pain.  h/o CABG and aortic valve replacement, the latter in 2008  h/o broken right shoulder eight years ago, and reports difficulty holding the right arm up.   Patient Stated Goals "to get where I can walk better and get around, and get back to my normal life"   Currently in Pain? No/denies                         [facility] Adult PT Treatment/Exercise - 05/15/16 0001      Neuro Re-ed    Neuro Re-ed Details  Standing on [employer] at back of bike: Narrow BOS 2 sets of 30 second trials, bil narrow step stance (unable to do full tandem) 1 set of 30 second trial each leg. Challenging for pt but able to do with minimal LOB     Knee/Hip Exercises: Aerobic   Stationary Bike Just for ROM and endurance, pt able to tolerate 3  mins, reporting some tightness felt in Rt knee     Knee/Hip Exercises: Standing   Hip Flexion Stengthening;Both;10 reps;Knee straight  2 lbs each ankle; +2 HHA at back of bike   Hip Flexion Limitations VC to contract quad and keep ankle in DF throughout   Hip Abduction Stengthening;Both;10 reps;Knee straight  2 lbs on each ankle; +1 HHA at back of bike   Abduction Limitations VC to  decrease hip er   Hip Extension Stengthening;Both;10 reps;Knee straight  2 lbs each ankle; +1 HHA at back of bike   Extension Limitations VC to decrease trunk flexion   Forward Step Up Both;1 set;5 reps;Hand Hold: 2;Step Height: 4"   Forward Step Up Limitations Tried 6" step first but increased pts Rt>Lt knee pain, and unable to do more than 5 times on each LE on 4"     Knee/Hip Exercises: Seated   Long Arc Quad Strengthening;Both;10 reps;Weights   Long Arc Quad Weight 2 lbs.   Knee/Hip Flexion Marching 20 times each leg, 2 lbs each ankle     Shoulder Exercises: Pulleys   Flexion 2 minutes   Flexion Limitations VC to decrease scapular compensations.    ABduction 2 minutes   ABduction Limitations VC and demonstration to decrease scapular compensation     Shoulder Exercises: Therapy Ball   Flexion 10 reps  With forward lean into top of stretch                   Short Term Clinic Goals - 05/02/16 1226      CC Short Term Goal  #1   Title Patient will be independent in HEP including LE strengthening, right shoulder ROM   Time 3   Period Weeks   Status New     CC Short Term Goal  #2   Title Patient's family members will be knowledgeable about correct compression bandaging for both LEs   Time 3   Period Weeks   Status New             Long Term Clinic Goals - 05/15/16 1030      CC Long Term Goal  #1   Title Will report at least 50% decrease in LE pain   Baseline Pt reported no pain urrently in her LE's-05/15/16   Status Achieved     CC Long Term Goal  #2   Title Will report  being able to unload the dishwasher without needing to stop to rest during the process   Status On-going     CC Long Term Goal  #3   Title will be independent in a home exercise program including strengthening and endurance   Baseline Issued initial HEP-05/09/16   Status On-going            Plan - 05/15/16 1026    Clinical Impression Statement Pt has shown progress already from last weeks session. She reports that she is able to walk around community a little better than last week and her husband even reporting notcing she was getting around better. She reports her endurance has imrpved and she is getting in/out of car easier. She hopes she can start driving again soon. She was able to tolerate ankle weights today with LE exercises and she required less HHA with standing hip exercises today than last week. Pt is doing ell and is compliant with her HEP at this time but will benefit from continued therapy to continue to address her deficits of limited community ambulation tolerance, LE weakness and Rt shoulder ROM deficits.   Rehab Potential Excellent   Clinical Impairments Affecting Rehab Potential none   PT Frequency 2x / week   PT Duration 6 weeks   PT Treatment/Interventions ADLs/Self Care Home Management;Electrical Stimulation;DME Instruction;Gait training;Stair training;Functional mobility training;Therapeutic exercise;Balance training;Neuromuscular re-education;Patient/family education;Manual techniques;Manual lymph drainage;Compression bandaging;Passive range of motion   PT Next Visit Plan Measaure Rt shoulder ROM for weekly objective measure; Do DGI,  or gait speed testing prn (BERG done last week).  Try Nustep as bike was a challenging due to Rt knee tightness; Cont strengthening of LEs, ROM with HEP for right shoulder (pt to use husbands pulleys, assess how this went); progress to endurance exercise and balance re-ed. Review HEP.   Consulted and Agree with Plan of Care Patient       Patient will benefit from skilled therapeutic intervention in order to improve the following deficits and impairments:  Decreased activity tolerance, Decreased balance, Decreased endurance, Decreased knowledge of use of DME, Decreased range of motion, Decreased strength, Increased edema, Impaired sensation, Pain, Decreased knowledge of precautions  Visit Diagnosis: Generalized edema  Muscle weakness (generalized)  Unsteadiness on feet  Stiffness of right shoulder, not elsewhere classified     Problem List Patient Active Problem List   Diagnosis Date Noted  . Chemotherapy-induced peripheral neuropathy ([place]) 04/27/2016  . 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 Cellulitis of left leg 04/06/2016  . Osteomyelitis of toe of left foot ([place]) 04/06/2016  . Antineoplastic chemotherapy induced anemia 03/09/2016  . Mania ([place]) 03/05/2016  . GERD (gastroesophageal reflux disease) 02/29/2016  . Chemotherapy induced nausea and vomiting 02/24/2016  . Port catheter in place 02/17/2016  . Breast cancer of upper-inner quadrant of right female breast ([place]) 12/16/2015  . Acute bronchitis 08/01/2015  . CKD (chronic kidney disease) 09/24/2013  . Chronic diastolic CHF (congestive heart failure) ([place]) 09/24/2013  . Acute on chronic combined systolic and diastolic CHF, NYHA class 3 ([place]) 07/17/2013  . Chest pain 06/15/2013  . Unstable angina ([place]) 06/14/2013  . Atrial tachycardia ([place]) 04/03/2013  . Non-ST elevation myocardial infarction (NSTEMI), initial episode of care ([place]) 02/23/2013  . CAD (coronary artery disease) 01/24/2011  . S/P aortic valve replacement 10/27/2010  . Hypertension   . Hyperlipidemia   . SOB (shortness of breath)   . CHF (congestive heart failure) ([place])   . Renal insufficiency, mild   . Dyspnea   . Nonischemic cardiomyopathy ([place])   . Anemia   . Diabetes mellitus, type 2 ([place])   . Iron deficiency anemia   . Allergic rhinitis 10/14/2009  . COUGH  10/14/2009  . HYPERLIPIDEMIA 05/12/2009  . HYPERTENSION 05/12/2009  . Atrial fibrillation ([place]) 05/12/2009  . Asthma 05/12/2009  . DYSPNEA 05/12/2009    Otelia Limes, PTA 05/15/2016, 10:33 AM  [place], [place], [address] Phone: [phone]   Fax:  [phone]  Name: Hannah [name] MRN: [id number] Date of Birth: 1946-03-03

## 2016-05-16 ENCOUNTER — Encounter: Payer: Medicare Other | Admitting: Physical Therapy

## 2016-05-16 ENCOUNTER — Ambulatory Visit
Admission: RE | Admit: 2016-05-16 | Discharge: 2016-05-16 | Disposition: A | Discharge status: Home / Self Care | Payer: Medicare Other | Source: Ambulatory Visit | Attending: Radiation Oncology | Admitting: Radiation Oncology

## 2016-05-16 DIAGNOSIS — C50211 Malignant neoplasm of upper-inner quadrant of right female breast: Secondary | ICD-10-CM

## 2016-05-16 DIAGNOSIS — Z510 Encounter for antineoplastic radiation therapy: Secondary | ICD-10-CM | POA: Diagnosis not present

## 2016-05-16 DIAGNOSIS — Z170 Estrogen receptor positive status [ER+]: Principal | ICD-10-CM

## 2016-05-17 ENCOUNTER — Other Ambulatory Visit: Payer: Self-pay

## 2016-05-17 ENCOUNTER — Ambulatory Visit: Payer: Medicare Other | Admitting: Cardiology

## 2016-05-17 MED ORDER — METOPROLOL SUCCINATE ER 100 MG PO TB24: 100.0000 mg | ORAL_TABLET | Freq: Two times a day (BID) | ORAL | 10 refills | Status: DC

## 2016-05-18 ENCOUNTER — Ambulatory Visit: Payer: Medicare Other | Admitting: Physical Therapy

## 2016-05-18 DIAGNOSIS — M6281 Muscle weakness (generalized): Secondary | ICD-10-CM

## 2016-05-18 DIAGNOSIS — R2681 Unsteadiness on feet: Secondary | ICD-10-CM

## 2016-05-18 DIAGNOSIS — M25611 Stiffness of right shoulder, not elsewhere classified: Secondary | ICD-10-CM

## 2016-05-18 DIAGNOSIS — R601 Generalized edema: Secondary | ICD-10-CM

## 2016-05-18 NOTE — Therapy (Signed)
[place], [place], [address] Phone: [phone]   Fax:  [phone]  Physical Therapy Treatment  Patient Details  Name: Hannah [name] MRN: [id number] Date of Birth: 17-Dec-1945 Referring Provider: Dr. Nicholas Lose  Encounter Date: 05/18/2016      PT End of Session - 05/18/16 1124    Visit Number 4   Number of Visits 13   Date for PT Re-Evaluation 06/15/16   PT Start Time 0936   PT Stop Time 1020   PT Time Calculation (min) 44 min   Activity Tolerance Patient tolerated treatment well   Behavior During Therapy [facility] for tasks assessed/performed      Past Medical History:  Diagnosis Date  . Abnormally small mouth   . Anemia   . Bilateral carotid artery stenosis    Bilateral ICA 40-59%/  >50% LECA  . Breast cancer ([place])   . Breast cancer of upper-inner quadrant of right female breast ([place]) 12/16/2015  . Bruises easily   . CAD (coronary artery disease) cardiologist-  dr Aundra Dubin   Remote CABG in 1998 with redo in 2009  . CHF (congestive heart failure) (HCC)    EF is low normal at 50 to 55% per echo in Jan. 2011  . Dyspnea   . GERD (gastroesophageal reflux disease)   . Hearing loss of right ear   . History of non-ST elevation myocardial infarction (NSTEMI)    Sept 2014--  thought to be type II HTN w/ LHC without infarct related artery and patent grafts  . Hyperlipidemia   . Hypertension   . Iron deficiency anemia   . Moderate persistent asthma    pulmologist-  Dr. Malvin Johns  . Nausea & vomiting    related to stone  . Nonischemic cardiomyopathy ([place])   . PAF (paroxysmal atrial fibrillation) ([place])   . PONV (postoperative nausea and vomiting)    pt states patch worked well before  . Psoriasis    right leg  . Renal calculus, right   . Renal insufficiency, mild   . S/P AVR    prosthesis valve placement 2009 at same time re-do CABG  . Stroke [facility])    residual rt hearing loss  . Type 2 diabetes  mellitus ([place])    monitored by dr Dwyane Dee    Past Surgical History:  Procedure Laterality Date  . AORTIC VALVE REPLACEMENT  2009   #58mm [facility] Ease pericardial valve (done same time is CABG)  . BREAST LUMPECTOMY WITH RADIOACTIVE SEED AND SENTINEL LYMPH NODE BIOPSY Right 01/18/2016   Procedure: RIGHT BREAST LUMPECTOMY WITH RADIOACTIVE SEED AND SENTINEL LYMPH NODE BIOPSY;  Surgeon: Alphonsa Overall, MD;  Location: [place];  Service: General;  Laterality: Right;  . CARDIAC CATHETERIZATION  03/23/2008   Pre-redo CABG: L main OK, LAD (T), CFX (T), OM1 99%, RCA (T), LIMA-LAD OK, SVG-OM(?3) OK w/ little florw to OM2, SVG-RCA OK. EF NL  . CARDIOVASCULAR STRESS TEST  06-15-2013      dr Debara Pickett   [facility] Adam perfusion study/  no evidence ischemia/  normal wall motion, ef 72%  . CARPAL TUNNEL RELEASE    . COLONOSCOPY    . CORONARY ARTERY BYPASS GRAFT  1998 &  re-do 2009   Had LIMA to DX/LAD, SVG to 2 marginal branches and SVG to [facility] originally; SVG to 3rd OM and PD at time of redo  . CYSTOSCOPY W/ URETERAL STENT PLACEMENT Right 12/20/2014   Procedure: CYSTOSCOPY WITH RETROGRADE PYELOGRAM/URETERAL STENT PLACEMENT;  Surgeon: Cleon Gustin, MD;  Location: [facility];  Service: Urology;  Laterality: Right;  . EYE SURGERY Bilateral    cataracts  . HOLMIUM LASER APPLICATION Right 3/55/7322   Procedure:  HOLMIUM LASER LITHOTRIPSY;  Surgeon: Cleon Gustin, MD;  Location: [facility];  Service: Urology;  Laterality: Right;  . LEFT HEART CATHETERIZATION WITH CORONARY/GRAFT ANGIOGRAM N/A 02/23/2013   Procedure: LEFT HEART CATHETERIZATION WITH Beatrix Fetters;  Surgeon: Blane Ohara, MD;  Location: [facility] CATH LAB;  Service: Cardiovascular;  Laterality: N/A;  . PORTACATH PLACEMENT Left 01/18/2016   Procedure: INSERTION PORT-A-CATH;  Surgeon: Alphonsa Overall, MD;  Location: [place];  Service: General;  Laterality: Left;  . TONSILLECTOMY    . TRANSTHORACIC ECHOCARDIOGRAM   02-24-2013      mild LVH,  ef 50-55%/  AV bioprosthesis was present with very mild stenosis and no regurg., mean grandient 80mmHg, peak grandient 59mmHg /  mild MR/  mild LAE and RAE/  moderate TR  . TUBAL LIGATION      There were no vitals filed for this visit.      Subjective Assessment - 05/18/16 0939    Subjective "I'm trying to get this done.  Home exercises are going okay. They have radiation scheduled now. I start that next week."   Currently in Pain? No/denies            [facility] PT Assessment - 05/18/16 0001      AROM   Right Shoulder Flexion 85 Degrees   Right Shoulder ABduction 70 Degrees                     [facility] Adult PT Treatment/Exercise - 05/18/16 0001      Knee/Hip Exercises: Aerobic   Nustep L3 x 4 mins. with legs only  pt. reported right leg fatigue; mild dyspnea following     Knee/Hip Exercises: Standing   Knee Flexion Strengthening;Right;Left;10 reps  2 lbs. each ankle; two hands on countertop   Hip Flexion Stengthening;Both;10 reps;Knee straight  2 lbs each ankle; +1 HHA at countertop   Hip Abduction Stengthening;Both;10 reps;Knee straight  2 lbs on each ankle; +1 HHA at countertop   Hip Extension Stengthening;Both;10 reps;Knee straight  2 lbs each ankle; +2 HHA at countertop   Extension Limitations mild dyspnea after these standing exercises     Knee/Hip Exercises: Seated   Long Arc Quad Strengthening;10 reps;Weights;Right;Left   Long Arc Quad Weight 2 lbs.   Knee/Hip Flexion Marching 20 times each leg, 2 lbs each ankle  left doesn't lift as high as right   Abduction/Adduction  Both;Strengthening;10 reps  green Theraband   Sit to Sand without UE support  6 reps was her limit     Shoulder Exercises: Standing   ABduction AAROM;Right;5 reps  at finger ladder, to #20 x 4, #21 x 1     Shoulder Exercises: Pulleys   Flexion 2 minutes   ABduction 2 minutes  reported fatigue during this     Shoulder Exercises: Therapy Ball    Flexion 10 reps  With forward lean into top of stretch                   Short Term Clinic Goals - 05/18/16 1127      CC Short Term Goal  #1   Title Patient will be independent in HEP including LE strengthening, right shoulder ROM   Status On-going     CC Short Term Goal  #  2   Title Patient's family members will be knowledgeable about correct compression bandaging for both LEs   Status Achieved             Long Term Clinic Goals - 05/18/16 1128      CC Long Term Goal  #5   Title right shoulder active flexion and abduction to at least 100 degrees for improved reach and ADLs   Status On-going            Plan - 05/18/16 1125    Clinical Impression Statement Patient did fine today on the Nustep x 4 minutes, and with strengthening exercises.  She does fatigue easily and has mild dyspnea with activity such as today.  Her right shoulder AROM is little changed from initial evaluation.  She is no longer bandaging legs but now has compression stockings, and needs assist to don them.   Rehab Potential Excellent   Clinical Impairments Affecting Rehab Potential none   PT Frequency 2x / week   PT Duration 6 weeks   PT Treatment/Interventions ADLs/Self Care Home Management;Electrical Stimulation;DME Instruction;Gait training;Stair training;Functional mobility training;Therapeutic exercise;Balance training;Neuromuscular re-education;Patient/family education;Manual techniques;Manual lymph drainage;Compression bandaging;Passive range of motion   PT Next Visit Plan Continue Nustep and strengthening.  Consider DGI or gait speed; progress to endurance exercise and balance re-ed.   Consulted and Agree with Plan of Care Patient      Patient will benefit from skilled therapeutic intervention in order to improve the following deficits and impairments:  Decreased activity tolerance, Decreased balance, Decreased endurance, Decreased knowledge of use of DME, Decreased range of motion,  Decreased strength, Increased edema, Impaired sensation, Pain, Decreased knowledge of precautions  Visit Diagnosis: Generalized edema  Muscle weakness (generalized)  Unsteadiness on feet  Stiffness of right shoulder, not elsewhere classified     Problem List Patient Active Problem List   Diagnosis Date Noted  . Chemotherapy-induced peripheral neuropathy ([place]) 04/27/2016  . 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 Cellulitis of left leg 04/06/2016  . Osteomyelitis of toe of left foot ([place]) 04/06/2016  . Antineoplastic chemotherapy induced anemia 03/09/2016  . Mania ([place]) 03/05/2016  . GERD (gastroesophageal reflux disease) 02/29/2016  . Chemotherapy induced nausea and vomiting 02/24/2016  . Port catheter in place 02/17/2016  . Breast cancer of upper-inner quadrant of right female breast ([place]) 12/16/2015  . Acute bronchitis 08/01/2015  . CKD (chronic kidney disease) 09/24/2013  . Chronic diastolic CHF (congestive heart failure) ([place]) 09/24/2013  . Acute on chronic combined systolic and diastolic CHF, NYHA class 3 ([place]) 07/17/2013  . Chest pain 06/15/2013  . Unstable angina ([place]) 06/14/2013  . Atrial tachycardia ([place]) 04/03/2013  . Non-ST elevation myocardial infarction (NSTEMI), initial episode of care ([place]) 02/23/2013  . CAD (coronary artery disease) 01/24/2011  . S/P aortic valve replacement 10/27/2010  . Hypertension   . Hyperlipidemia   . SOB (shortness of breath)   . CHF (congestive heart failure) ([place])   . Renal insufficiency, mild   . Dyspnea   . Nonischemic cardiomyopathy ([place])   . Anemia   . Diabetes mellitus, type 2 ([place])   . Iron deficiency anemia   . Allergic rhinitis 10/14/2009  . COUGH 10/14/2009  . HYPERLIPIDEMIA 05/12/2009  . HYPERTENSION 05/12/2009  . Atrial fibrillation ([place]) 05/12/2009  . Asthma 05/12/2009  . DYSPNEA 05/12/2009    Eston Heslin 05/18/2016, 11:32 AM  [place], [place], [address] Phone: [phone]   Fax:  [phone]  Name: Hannah  REBEKA [name] MRN: [id number] Date of Birth: June 24, 1945  Serafina Royals, PT 05/18/16 11:32 AM

## 2016-05-20 NOTE — Progress Notes (Signed)
  Radiation Oncology         [phone] ________________________________  Name: Hannah [name] MRN: [id number]  Date: 05/16/2016  DOB: 07-04-1945  Optical Surface Tracking Plan:  Since intensity modulated radiotherapy (IMRT) and 3D conformal radiation treatment methods are predicated on accurate and precise positioning for treatment, intrafraction motion monitoring is medically necessary to ensure accurate and safe treatment delivery.  The ability to quantify intrafraction motion without excessive ionizing radiation dose can only be performed with optical surface tracking. Accordingly, surface imaging offers the opportunity to obtain 3D measurements of patient position throughout IMRT and 3D treatments without excessive radiation exposure.  I am ordering optical surface tracking for this patient's upcoming course of radiotherapy. ________________________________  Gery Pray, MD 05/20/2016 12:01 PM    Reference:   Ursula Alert, J, et al. Surface imaging-based analysis of intrafraction motion for breast radiotherapy patients.Journal of [place], n. 6, nov. 2014. ISSN [id number].   Available at: <http://www.jacmp.org/index.php/jacmp/article/view/4957>.

## 2016-05-20 NOTE — Progress Notes (Signed)
  Radiation Oncology         [phone] ________________________________  Name: Hannah [name] MRN: [id number]  Date: 05/16/2016  DOB: May 21, 1946  SIMULATION AND TREATMENT PLANNING NOTE    ICD-9-CM ICD-10-CM   1. 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  Identity was confirmed.  All relevant records and images related to the planned course of therapy were reviewed.  The patient freely provided informed written consent to proceed with treatment after reviewing the details related to the planned course of therapy. The consent form was witnessed and verified by the simulation staff.  Then, the patient was set-up in a stable reproducible  supine position for radiation therapy.  CT images were obtained.  Surface markings were placed.  The CT images were loaded into the planning software.  Then the target and avoidance structures were contoured.  Treatment planning then occurred.  The radiation prescription was entered and confirmed.  Then, I designed and supervised the construction of a total of 3 medically necessary complex treatment devices.  I have requested : 3D Simulation  I have requested a DVH of the following structures: heart, lungs, lumpectomy cavity.  I have ordered: dose calc.  PLAN:  The patient will receive 50.4 Gy in 28 fractions, Followed by a boost to the lumpectomy cavity of 10 gray for a cumulative dose of 60.4 gray.  -----------------------------------  Blair Promise, PhD, MD

## 2016-05-20 NOTE — Addendum Note (Signed)
Encounter addended by: Gery Pray, MD on: 05/20/2016 12:01 PM<BR>    Actions taken: Sign clinical note

## 2016-05-22 ENCOUNTER — Ambulatory Visit: Payer: Medicare Other

## 2016-05-22 DIAGNOSIS — M6281 Muscle weakness (generalized): Secondary | ICD-10-CM

## 2016-05-22 DIAGNOSIS — R2681 Unsteadiness on feet: Secondary | ICD-10-CM

## 2016-05-22 DIAGNOSIS — M25611 Stiffness of right shoulder, not elsewhere classified: Secondary | ICD-10-CM

## 2016-05-22 DIAGNOSIS — Z510 Encounter for antineoplastic radiation therapy: Secondary | ICD-10-CM | POA: Diagnosis not present

## 2016-05-22 DIAGNOSIS — R601 Generalized edema: Secondary | ICD-10-CM | POA: Diagnosis not present

## 2016-05-22 NOTE — Progress Notes (Signed)
CARDIOLOGY OFFICE NOTE  Date:  05/23/2016    Hannah [name] Date of Birth: 1945/09/25 Medical Record #[id number]  PCP:  Nicola Girt, DO  Cardiologist:  Annabell Howells  Chief Complaint  Patient presents with  . Congestive Heart Failure    Follow up visit.     History of Present Illness: Hannah [name] is a 70 y.o. female who presents today for a follow up visit. She is a former patient of Dr. Susa Simmonds. Seen for Dr. Aundra Dubin. She now follows with me.   She has a history of CAD s/p CABG, bioprosthetic AVR, CKD, paroxysmal atrial fibrillation presents for cardiology followup. She had her initial CABG in 1998, then had redo CABG in 2009 with bioprosthetic AVR. She does not remember when she last had documented atrial fibrillation, but she thinks it was a number of years ago, sounds peri-operative around the time of her redo CABG. She was never put on anticoagulation but had been on dronedarone.   In 9/14, she developed tachypalpitations and later chest pain. In the ER, SBP was in the 200s with elevated HR. Troponin was mildly elevated. Therefore, she had a cardiac cath showing patent grafts. She was thought to have had demand ischemia in the setting of hypertensive emergency + tachycardia. After discharge, she continued to have runs of tachypalpitations. A 3 week event monitor was put on her. This showed runs, sometimes long, of atrial tachycardia. She was started on Toprol XL when these runs were noted. No further palpitations (complete resolution of symptoms).   In 1/15, she was admitted to [facility] with atypical chest pain. She was ruled out for MI and Lexiscan Cardiolite showed no ischemia. She was started on Imdur and sent home. After discharge, she developed a daily headache and nausea. She did not eat or drink much due to the nausea. Shortly after this, she was standing in her grandson's pediatrician's office and became severely lightheaded. She had to  sit down. She came to the ER and was found to be orthostatic. She was given IV fluid and got better quickly. The Imdur was stopped and symptoms seem to have completely resolved.   Event monitor in 8/16 showed atrial tachycardia versus atypical atrial flutter. Toprol XL was increased to 100 mg bid. She saw Dr Lovena Le who thought that she had short runs of atrial tachycardia and not atrial flutter. Therefore, she has not been anticoagulated. She hvaingoccasional brief palpitations but overall was felt to bedoing better.   She saw Dr. Aundra Dubin back in April of 2017 - she was doing well.   Has been diagnosed with breast cancer since her last visit here. Has had surgery. Getting chemo/radiation - no Adriamycin due to her history of CHF.  I last saw her back in October - getting chemo. Overall seemed to be holding her on.   I saw her last week - very volume overload - unable to get her 4th dose of chemo due to severe fatigue,, neuropathy and cellulitis. Anemic. Referred back here from pulmonary. I diuresed her. Seen right after Thanksgiving - she was losing weight. Symptoms slowly improving.   Comes back today. Here alone today. She continues to improve steadily. Feels better. Breathing continues to improve. Starting radiation tomorrow. No chest pain. Some palpitations. HR improves with rest.  Swelling has improved. Can wear more normal shoes now. Weight is still above from when I saw her in October (198)  PMH: 1. Diabetes mellitus 2. HTN 3. Obesity  4. Atrial arrhythmias: Paroxysmal atrial fibrillation seems to have been post-operative after CAD-AVR. She has a history of amiodarone toxicity. She has not been on coumadin. 3-week event monitor (9/13) with no atrial fibrillation. 3-week event monitor (10/14) with runs of probable atrial tachycardia. 30 day monitor in 8/16 with atrial tachycardia (multiple episodes).  5. CAD: CABG 1998 with LIMA-LAD/diagonal, sequential SVG-OM1/OM2,  SVG-RCA. Redo CABG 2009 with SVG-OM3, SVG-PDA. Lexiscan myoview (9/13): EF 61%, no ischemia or infarction. LHC (10/14) with all grafts patent except the old SVG-RCA. There was 90% stenosis in the AV LCx before ungrafted MOM, but this was unchanged from prior study. Lexiscan Cardiolite (1/15) with no ischemia. She does not tolerate Imdur.  6. CKD 7. Bioprosthetic aortic valve: Echo (2011) with EF 50-55%, bioprosthetic aortic valve well-seated. Echo (2/13) with EF 60-65%, bioprosthetic aortic valve looked ok. Echo (9/14): EF 50-55%, mild LVH, bioprosthetic AVR with mean gradient 10, mild MR, moderate TR.  8. Asthma: since her teens. 9. Low back pain.  10. 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 Per neurologist, did not appear cardioembolic.  11. Carotid stenosis: 40-59% bilateral ICA stenosis on 10/14 carotid dopplers. Carotid doppler (11/15) with 40-59% bilateral ICA stenosis. Carotid dopplers (11/16) with 40-59% BICA stenosis.  12. Sleep study 12/14 without significant OSA.  13. Chronic diastolic CHF   Past Medical History:  Diagnosis Date  . Abnormally small mouth   . Anemia   . Bilateral carotid artery stenosis    Bilateral ICA 40-59%/  >50% LECA  . Breast cancer ([place])   . Breast cancer of upper-inner quadrant of right female breast ([place]) 12/16/2015  . Bruises easily   . CAD (coronary artery disease) cardiologist-  dr Aundra Dubin   Remote CABG in 1998 with redo in 2009  . CHF (congestive heart failure) (HCC)    EF is low normal at 50 to 55% per echo in Jan. 2011  . Dyspnea   . GERD (gastroesophageal reflux disease)   . Hearing loss of right ear   . History of non-ST elevation myocardial infarction (NSTEMI)    Sept 2014--  thought to be type II HTN w/ LHC without infarct related artery and patent grafts  . Hyperlipidemia   . Hypertension   . Iron deficiency anemia   . Moderate persistent asthma    pulmologist-  Dr. Malvin Johns  . Nausea & vomiting    related to stone  .  Nonischemic cardiomyopathy ([place])   . PAF (paroxysmal atrial fibrillation) ([place])   . PONV (postoperative nausea and vomiting)    pt states patch worked well before  . Psoriasis    right leg  . Renal calculus, right   . Renal insufficiency, mild   . S/P AVR    prosthesis valve placement 2009 at same time re-do CABG  . Stroke [facility])    residual rt hearing loss  . Type 2 diabetes mellitus ([place])    monitored by dr Dwyane Dee    Past Surgical History:  Procedure Laterality Date  . AORTIC VALVE REPLACEMENT  2009   #42mm [facility] Ease pericardial valve (done same time is CABG)  . BREAST LUMPECTOMY WITH RADIOACTIVE SEED AND SENTINEL LYMPH NODE BIOPSY Right 01/18/2016   Procedure: RIGHT BREAST LUMPECTOMY WITH RADIOACTIVE SEED AND SENTINEL LYMPH NODE BIOPSY;  Surgeon: Alphonsa Overall, MD;  Location: [facility];  Service: General;  Laterality: Right;  . CARDIAC CATHETERIZATION  03/23/2008   Pre-redo CABG: L main OK, LAD (T), CFX (T), OM1 99%, RCA (T),  LIMA-LAD OK, SVG-OM(?3) OK w/ little florw to OM2, SVG-RCA OK. EF NL  . CARDIOVASCULAR STRESS TEST  06-15-2013      dr Debara Pickett   [facility] perfusion study/  no evidence ischemia/  normal wall motion, ef 72%  . CARPAL TUNNEL RELEASE    . COLONOSCOPY    . CORONARY ARTERY BYPASS GRAFT  1998 &  re-do 2009   Had LIMA to DX/LAD, SVG to 2 marginal branches and SVG to [facility] originally; SVG to 3rd OM and PD at time of redo  . CYSTOSCOPY W/ URETERAL STENT PLACEMENT Right 12/20/2014   Procedure: CYSTOSCOPY WITH RETROGRADE PYELOGRAM/URETERAL STENT PLACEMENT;  Surgeon: Cleon Gustin, MD;  Location: [facility];  Service: Urology;  Laterality: Right;  . EYE SURGERY Bilateral    cataracts  . HOLMIUM LASER APPLICATION Right 7/90/2409   Procedure:  HOLMIUM LASER LITHOTRIPSY;  Surgeon: Cleon Gustin, MD;  Location: [facility];  Service: Urology;  Laterality: Right;  . LEFT HEART CATHETERIZATION WITH CORONARY/GRAFT ANGIOGRAM N/A 02/23/2013    Procedure: LEFT HEART CATHETERIZATION WITH Beatrix Fetters;  Surgeon: Blane Ohara, MD;  Location: [facility] CATH LAB;  Service: Cardiovascular;  Laterality: N/A;  . PORTACATH PLACEMENT Left 01/18/2016   Procedure: INSERTION PORT-A-CATH;  Surgeon: Alphonsa Overall, MD;  Location: [facility];  Service: General;  Laterality: Left;  . TONSILLECTOMY    . TRANSTHORACIC ECHOCARDIOGRAM  02-24-2013      mild LVH,  ef 50-55%/  AV bioprosthesis was present with very mild stenosis and no regurg., mean grandient 66mmHg, peak grandient 57mmHg /  mild MR/  mild LAE and RAE/  moderate TR  . TUBAL LIGATION       Medications: Current Outpatient Prescriptions  Medication Sig Dispense Refill  . ACCU-CHEK AVIVA PLUS test strip     . alendronate (FOSAMAX) 70 MG tablet Take 70 mg by mouth once a week. Take with a full glass of water on an empty stomach.    Marland Kitchen aspirin EC 81 MG EC tablet Take 1 tablet (81 mg total) by mouth daily.    . budesonide-formoterol (SYMBICORT) 160-4.5 MCG/ACT inhaler INHALE TWO PUFFS INTO LUNGS TWICE DAILY 1 Inhaler 11  . clopidogrel (PLAVIX) 75 MG tablet Take 75 mg by mouth daily.    Marland Kitchen docusate sodium (COLACE) 100 MG capsule Take 1 capsule (100 mg total) by mouth daily as needed for mild constipation. 10 capsule 0  . furosemide (LASIX) 80 MG tablet Take 1 tablet (80 mg total) by mouth daily. 1 tablet in the AM and 1/2 tablet (40 mg) in the early afternoon. 90 tablet 3  . gabapentin (NEURONTIN) 300 MG capsule Take 1 capsule (300 mg total) by mouth 3 (three) times daily. 90 capsule 3  . insulin lispro (HUMALOG KWIKPEN) 100 UNIT/ML KiwkPen INJECT 10 UNITS INTO THE SKIN WITH MEALS PLUS SLIDING SCALE 15 mL 2  . insulin lispro (HUMALOG) 100 UNIT/ML injection Inject 76 units daily with  V-GO pump 70 mL 2  . lidocaine-prilocaine (EMLA) cream Apply to affected area once 30 g 3  . loratadine (CLARITIN) 10 MG tablet Take 10 mg by mouth daily.    Marland Kitchen LORazepam (ATIVAN) 2 MG tablet Take 0.5 tablets (1 mg  total) by mouth at bedtime. 60 tablet 1  . metoprolol succinate (TOPROL-XL) 100 MG 24 hr tablet Take 1 tablet (100 mg total) by mouth 2 (two) times daily. Take with or immediately following a meal. 60 tablet 10  . omeprazole (PRILOSEC OTC) 20  MG tablet Take 20 mg by mouth daily as needed (heartburn).     . potassium chloride SA (KLOR-CON M20) 20 MEQ tablet Take 1 tablet (20 mEq total) by mouth 2 (two) times daily. 60 tablet 3  . rosuvastatin (CRESTOR) 40 MG tablet TAKE ONE TABLET BY MOUTH ONCE DAILY 30 tablet 11  . TraMADol HCl 200 MG CP24 Take 200 mg by mouth as needed (for back pain).     . TRULICITY 3.15 QM/0.8QP SOPN Inject 0.75 mg into the skin every Wednesday.     Marland Kitchen ZETIA 10 MG tablet Take 1 tablet by mouth  daily 90 tablet 2   No current facility-administered medications for this visit.     Allergies: Allergies  Allergen Reactions  . Amoxicillin Rash    Has patient had a PCN reaction causing immediate rash, facial/tongue/throat swelling, SOB or lightheadedness with hypotension: No Has patient had a PCN reaction causing severe rash involving mucus membranes or skin necrosis: Yes Has patient had a PCN reaction that required hospitalization No Has patient had a PCN reaction occurring within the last 10 years: No If all of the above answers are "NO", then may proceed with Cephalosporin use.   . Imdur [Isosorbide Dinitrate] Other (See Comments)    Headache/severe hypotension/Syncope  . Latex Itching  . Tetracycline Rash    Social History: The patient  reports that she has never smoked. She has never used smokeless tobacco. She reports that she does not drink alcohol or use drugs.   Family History: The patient's family history includes Diabetes in her maternal grandmother and son; Heart disease in her father and mother; Heart failure in her father.   Review of Systems: Please see the history of present illness.   Otherwise, the review of systems is positive for none.   All other  systems are reviewed and negative.   Physical Exam: VS:  BP (!) 98/54   Pulse (!) 112   Ht 5\' 5"  (1.651 m)   Wt 206 lb (93.4 kg)   SpO2 100%   BMI 34.28 kg/m  .  BMI Body mass index is 34.28 kg/m.  Wt Readings from Last 3 Encounters:  05/23/16 206 lb (93.4 kg)  05/14/16 212 lb 9.6 oz (96.4 kg)  05/10/16 211 lb 6.4 oz (95.9 kg)    General: Pleasant. Well developed, well nourished and in no acute distress.  Looks stronger today. Color not as sallow.  HEENT: Normal.  Neck: Supple, no JVD, carotid bruits, or masses noted.  Cardiac: Regular rate and rhythm. Rate is down after resting. Less edema.  Respiratory:  Lungs are clear to auscultation bilaterally with normal work of breathing.  GI: Soft and nontender.  MS: No deformity or atrophy. Gait and ROM intact.  Skin: Warm and dry. Color is normal.  Neuro:  Strength and sensation are intact and no gross focal deficits noted.  Psych: Alert, appropriate and with normal affect.   LABORATORY DATA:  EKG:  EKG is ordered today. This demonstrates NSR - rate is down to 74.  Lab Results  Component Value Date   WBC 7.4 05/08/2016   HGB 9.5 (L) 05/08/2016   HCT 30.3 (L) 05/08/2016   PLT 173 05/08/2016   GLUCOSE 103 (H) 05/08/2016   CHOL 116 (L) 07/05/2015   TRIG 99 07/05/2015   HDL 38 (L) 07/05/2015   LDLDIRECT 109.2 03/01/2014   LDLCALC 58 07/05/2015   ALT 24 04/27/2016   AST 28 04/27/2016   NA 142 05/08/2016  K 4.0 05/08/2016   CL 100 05/08/2016   CREATININE 1.45 (H) 05/08/2016   BUN 31 (H) 05/08/2016   CO2 35 (H) 05/08/2016   TSH 3.355 04/08/2016   INR 1.06 06/15/2013   HGBA1C 6.4 05/08/2016   MICROALBUR 24.3 (H) 09/19/2015    BNP (last 3 results)  Recent Labs  05/08/16 1144  BNP 209.4*    ProBNP (last 3 results) No results for input(s): PROBNP in the last 8760 hours.   Other Studies Reviewed Today:  Echo Study Conclusions from 03/2016  - Left ventricle: The cavity size was normal. There was  moderate focal basal and mild concentric hypertrophy. Systolic function was normal. The estimated ejection fraction was in the range of 60% to 65%. Images were inadequate for LV wall motion assessment. Features are consistent with a pseudonormal left ventricular filling pattern, with concomitant abnormal relaxation and increased filling pressure (grade 2 diastolic dysfunction). Doppler parameters are consistent with high ventricular filling pressure. - Aortic valve: A bioprosthesis was present and functioning normally. Mean gradient (S): 17 mm Hg. - Mitral valve: Moderately calcified annulus. - Pulmonary arteries: PA peak pressure: 38 mm Hg (S).  Impressions:  - The right ventricular systolic pressure was increased consistent with mild pulmonary hypertension.   MYOVIEW FINDINGS FROM 06/2013: Normal resting EKG. Slight ST depressions noted in aVL after administration of lexiscan. Mild shortness of breath and nausea resolved after the test. EKG is nondiagnostic for ischemia. TID ratio 1.05. Lung-heart ratio 0.43. Normal ventricular chamber size.  IMPRESSION: No evidence for ischemia. Normal wall motion. LVEF 72%.   Electronically Signed By: Guy Sandifer On: 06/15/2013 14:27    Assessment/Plan:  1. Acute on chronic diastolic HF - - she is improved with her current diuretic but still up from 198# when I saw her back in October. This is most likely aggravated by her anemia due to chemotherapy. I am going to leave her on her current regimen with the increased dose of Lasix. Lab today. See back in 3 weeks. No need for Zaroxolyn now.   2. Anemia - worse from the chemo but she has had this in the past and was on Aranesp in the past - ?if needs to be reconsidered - will defer to oncology.   3. CAD  Admission in 9/14 with mild troponin elevation. LHC showed stable anatomy with patent grafts except for SVG-RCA from older CABG. Suspect that the  troponin elevation was due to demand ischemia in the setting of hypertensive emergency and tachycardia (possible run of atrial tachycardia). Admission in 1/15 with atypical chest pain, had Lexiscan Cardiolite that showed no ischemia. No recent chest pain.  - Continue ASA 81, Plavix, statin, Toprol XL.   4. Atrial Arrhythmias Patient has not been on anticoagulation. She apparently had atrial fibrillation at some point in the remote past. It seems that the atrial fibrillation may have been post-op after redo CABG and has not recurred.  She has been noted to have atrial tachycardia but no atrial fibrillation or flutter. No real complaint of palpitations today but having so many other issues. Heart rate improved today with just resting - clearly in NSR during my visit today.  5. Bioprosthetic aortic valve Most recent echo from October noted  6. Carotid bruit She is to have repeat dopplers later in the fall - has a lot going on with her breast cancer - will arrange after her radiation is complete.    7. CKD  8. Breast cancer- no more chemobut with plans  for radiation to follow starting next week.    Current medicines are reviewed with the patient today.  The patient does not have concerns regarding medicines other than what has been noted above.  The following changes have been made:  See above.  Labs/ tests ordered today include:   No orders of the defined types were placed in this encounter.    Disposition:   FU with me in about 3 weeks.    Patient is agreeable to this plan and will call if any problems develop in the interim.   Signed: [name], RN, ANP-C 05/23/2016 11:19 AM  [facility] [address] [place], [place]  [address] Phone: [phone] Fax: [phone]

## 2016-05-22 NOTE — Therapy (Signed)
[place], [place], [address] Phone: [phone]   Fax:  [phone]  Physical Therapy Treatment  Patient Details  Name: Hannah [name] MRN: [id number] Date of Birth: 08/27/1945 Referring Provider: Dr. Nicholas Lose  Encounter Date: 05/22/2016      PT End of Session - 05/22/16 1208    Visit Number 5   Number of Visits 13   Date for PT Re-Evaluation 06/15/16   PT Start Time 1110   PT Stop Time 1156   PT Time Calculation (min) 46 min   Activity Tolerance Patient tolerated treatment well   Behavior During Therapy [facility] for tasks assessed/performed      Past Medical History:  Diagnosis Date  . Abnormally small mouth   . Anemia   . Bilateral carotid artery stenosis    Bilateral ICA 40-59%/  >50% LECA  . Breast cancer ([place])   . Breast cancer of upper-inner quadrant of right female breast ([place]) 12/16/2015  . Bruises easily   . CAD (coronary artery disease) cardiologist-  dr Aundra Dubin   Remote CABG in 1998 with redo in 2009  . CHF (congestive heart failure) (HCC)    EF is low normal at 50 to 55% per echo in Jan. 2011  . Dyspnea   . GERD (gastroesophageal reflux disease)   . Hearing loss of right ear   . History of non-ST elevation myocardial infarction (NSTEMI)    Sept 2014--  thought to be type II HTN w/ LHC without infarct related artery and patent grafts  . Hyperlipidemia   . Hypertension   . Iron deficiency anemia   . Moderate persistent asthma    pulmologist-  Dr. Malvin Johns  . Nausea & vomiting    related to stone  . Nonischemic cardiomyopathy ([place])   . PAF (paroxysmal atrial fibrillation) ([place])   . PONV (postoperative nausea and vomiting)    pt states patch worked well before  . Psoriasis    right leg  . Renal calculus, right   . Renal insufficiency, mild   . S/P AVR    prosthesis valve placement 2009 at same time re-do CABG  . 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 Type 2 diabetes  mellitus ([place])    monitored by dr Dwyane Dee    Past Surgical History:  Procedure Laterality Date  . AORTIC VALVE REPLACEMENT  2009   #35mm [facility] Ease pericardial valve (done same time is CABG)  . BREAST LUMPECTOMY WITH RADIOACTIVE SEED AND SENTINEL LYMPH NODE BIOPSY Right 01/18/2016   Procedure: RIGHT BREAST LUMPECTOMY WITH RADIOACTIVE SEED AND SENTINEL LYMPH NODE BIOPSY;  Surgeon: Alphonsa Overall, MD;  Location: [place];  Service: General;  Laterality: Right;  . CARDIAC CATHETERIZATION  03/23/2008   Pre-redo CABG: L main OK, LAD (T), CFX (T), OM1 99%, RCA (T), LIMA-LAD OK, SVG-OM(?3) OK w/ little florw to OM2, SVG-RCA OK. EF NL  . CARDIOVASCULAR STRESS TEST  06-15-2013      dr Debara Pickett   [facility] Adam perfusion study/  no evidence ischemia/  normal wall motion, ef 72%  . CARPAL TUNNEL RELEASE    . COLONOSCOPY    . CORONARY ARTERY BYPASS GRAFT  1998 &  re-do 2009   Had LIMA to DX/LAD, SVG to 2 marginal branches and SVG to [facility] originally; SVG to 3rd OM and PD at time of redo  . CYSTOSCOPY W/ URETERAL STENT PLACEMENT Right 12/20/2014   Procedure: CYSTOSCOPY WITH RETROGRADE PYELOGRAM/URETERAL STENT PLACEMENT;  Surgeon: Cleon Gustin, MD;  Location: [facility];  Service: Urology;  Laterality: Right;  . EYE SURGERY Bilateral    cataracts  . HOLMIUM LASER APPLICATION Right 7/40/8144   Procedure:  HOLMIUM LASER LITHOTRIPSY;  Surgeon: Cleon Gustin, MD;  Location: [facility];  Service: Urology;  Laterality: Right;  . LEFT HEART CATHETERIZATION WITH CORONARY/GRAFT ANGIOGRAM N/A 02/23/2013   Procedure: LEFT HEART CATHETERIZATION WITH Beatrix Fetters;  Surgeon: Blane Ohara, MD;  Location: [facility] CATH LAB;  Service: Cardiovascular;  Laterality: N/A;  . PORTACATH PLACEMENT Left 01/18/2016   Procedure: INSERTION PORT-A-CATH;  Surgeon: Alphonsa Overall, MD;  Location: [place];  Service: General;  Laterality: Left;  . TONSILLECTOMY    . TRANSTHORACIC ECHOCARDIOGRAM   02-24-2013      mild LVH,  ef 50-55%/  AV bioprosthesis was present with very mild stenosis and no regurg., mean grandient 85mmHg, peak grandient 60mmHg /  mild MR/  mild LAE and RAE/  moderate TR  . TUBAL LIGATION      There were no vitals filed for this visit.      Subjective Assessment - 05/22/16 1114    Subjective I was tired after last session but no more than what I usually feel. And the HEP is going well except my calvees have been sore when I finish.    Pertinent History Diagnosed with right breast cancer and had two lumpectomies in August with sentinel lymph node biospy (negative).  Finished chemo in late October.  Hospitalized for left leg cellulitis the last week in October and just finished antibiotic.  Will start radiation in December (assessed 05/14/16).  Diabetes and has a pump; atrial fibrillation.  Neuropathy in feet, toes especially, mostly numbness and some pain.  h/o CABG and aortic valve replacement, the latter in 2008  h/o broken right shoulder eight years ago, and reports difficulty holding the right arm up.   Patient Stated Goals "to get where I can walk better and get around, and get back to my normal life"   Currently in Pain? No/denies                         [facility] Adult PT Treatment/Exercise - 05/22/16 0001      Neuro Re-ed    Neuro Re-ed Details  Standing on Airex at back of bike: Bil tandem stance 2 set of 30 sec trials; then SLS on each LE for 2 sets of 30 sec trials +1-2 HHA throughout prn. Tandem walking in hall with gait belt 2 sets of 20 ft, and then crossover in front walking with Lt, then Rt foot 20 ft each, this was most challenging for pt.      Knee/Hip Exercises: Aerobic   Nustep Level 3 x6 minutes  Pt did not have LE fatigue after this today     Knee/Hip Exercises: Standing   Hip Flexion Stengthening;Both;10 reps;Knee bent  Alt march, 2 lbs each ankle on Airex at back of bike   Hip Abduction Stengthening;Both;10 reps;Knee straight   2 lbs on each ankle standing on Airex at back of bike   Hip Extension Stengthening;Both;10 reps;Knee straight  2 lbs each ankle on Airex at back of bike     Knee/Hip Exercises: Seated   Long Arc Quad Strengthening;10 reps;Weights;Right;Left   Long Arc Quad Weight 2 lbs.   Knee/Hip Flexion Marching 20 times each leg, 2 lbs each ankle  Slightly limited ROM on Lt but seems  improved from last week     Shoulder Exercises: Pulleys   Flexion 2 minutes   ABduction 2 minutes     Shoulder Exercises: Therapy Ball   Flexion 10 reps  With forward lean into top of strech   Flexion Limitations VC to decrease scapular compensations, pt able to correct this after cuing.                    Short Term Clinic Goals - 05/18/16 1127      CC Short Term Goal  #1   Title Patient will be independent in HEP including LE strengthening, right shoulder ROM   Status On-going     CC Short Term Goal  #2   Title Patient's family members will be knowledgeable about correct compression bandaging for both LEs   Status Achieved             Long Term Clinic Goals - 05/18/16 1128      CC Long Term Goal  #5   Title right shoulder active flexion and abduction to at least 100 degrees for improved reach and ADLs   Status On-going            Plan - 05/22/16 1209    Clinical Impression Statement Pt did very well with progression of exercises today reporting minimal LE fatigue and no dyspnea. Also reported feeling challenged by high level balance activities and was instructed to not try these at home just yet and she agreed. Overall she feels her endurance has improved since start of care.    Rehab Potential Excellent   Clinical Impairments Affecting Rehab Potential none   PT Frequency 2x / week   PT Duration 6 weeks   PT Treatment/Interventions ADLs/Self Care Home Management;Electrical Stimulation;DME Instruction;Gait training;Stair training;Functional mobility training;Therapeutic  exercise;Balance training;Neuromuscular re-education;Patient/family education;Manual techniques;Manual lymph drainage;Compression bandaging;Passive range of motion   PT Next Visit Plan Continue Nustep and strengthening.  Consider DGI or gait speed; progress to endurance exercise and balance re-ed.   Consulted and Agree with Plan of Care Patient      Patient will benefit from skilled therapeutic intervention in order to improve the following deficits and impairments:  Decreased activity tolerance, Decreased balance, Decreased endurance, Decreased knowledge of use of DME, Decreased range of motion, Decreased strength, Increased edema, Impaired sensation, Pain, Decreased knowledge of precautions  Visit Diagnosis: Generalized edema  Muscle weakness (generalized)  Unsteadiness on feet  Stiffness of right shoulder, not elsewhere classified     Problem List Patient Active Problem List   Diagnosis Date Noted  . Chemotherapy-induced peripheral neuropathy ([place]) 04/27/2016  . 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 Cellulitis of left leg 04/06/2016  . Osteomyelitis of toe of left foot ([place]) 04/06/2016  . Antineoplastic chemotherapy induced anemia 03/09/2016  . Mania ([place]) 03/05/2016  . GERD (gastroesophageal reflux disease) 02/29/2016  . Chemotherapy induced nausea and vomiting 02/24/2016  . Port catheter in place 02/17/2016  . Breast cancer of upper-inner quadrant of right female breast ([place]) 12/16/2015  . Acute bronchitis 08/01/2015  . CKD (chronic kidney disease) 09/24/2013  . Chronic diastolic CHF (congestive heart failure) ([place]) 09/24/2013  . Acute on chronic combined systolic and diastolic CHF, NYHA class 3 ([place]) 07/17/2013  . Chest pain 06/15/2013  . Unstable angina ([place]) 06/14/2013  . Atrial tachycardia ([place]) 04/03/2013  . Non-ST elevation myocardial infarction (NSTEMI), initial episode of care ([place]) 02/23/2013  . CAD (coronary artery disease) 01/24/2011  . S/P  aortic valve replacement 10/27/2010  .  Hypertension   . Hyperlipidemia   . SOB (shortness of breath)   . CHF (congestive heart failure) ([place])   . Renal insufficiency, mild   . Dyspnea   . Nonischemic cardiomyopathy ([place])   . Anemia   . Diabetes mellitus, type 2 ([place])   . Iron deficiency anemia   . Allergic rhinitis 10/14/2009  . COUGH 10/14/2009  . HYPERLIPIDEMIA 05/12/2009  . HYPERTENSION 05/12/2009  . Atrial fibrillation ([place]) 05/12/2009  . Asthma 05/12/2009  . DYSPNEA 05/12/2009    Otelia Limes, PTA 05/22/2016, 12:11 PM  [place], [place], [address] Phone: [phone]   Fax:  [phone]  Name: Hannah [name] MRN: [id number] Date of Birth: August 23, 1945

## 2016-05-23 ENCOUNTER — Encounter: Payer: Self-pay | Admitting: Nurse Practitioner

## 2016-05-23 ENCOUNTER — Encounter: Payer: Medicare Other | Admitting: Physical Therapy

## 2016-05-23 ENCOUNTER — Ambulatory Visit
Admission: RE | Admit: 2016-05-23 | Discharge: 2016-05-23 | Disposition: A | Discharge status: Home / Self Care | Payer: Medicare Other | Source: Ambulatory Visit | Attending: Radiation Oncology | Admitting: Radiation Oncology

## 2016-05-23 ENCOUNTER — Ambulatory Visit (INDEPENDENT_AMBULATORY_CARE_PROVIDER_SITE_OTHER): Payer: Medicare Other | Admitting: Nurse Practitioner

## 2016-05-23 VITALS — BP 98/54 | HR 112 | Ht 65.0 in | Wt 206.0 lb

## 2016-05-23 DIAGNOSIS — Z510 Encounter for antineoplastic radiation therapy: Secondary | ICD-10-CM | POA: Diagnosis not present

## 2016-05-23 DIAGNOSIS — I2510 Atherosclerotic heart disease of native coronary artery without angina pectoris: Secondary | ICD-10-CM | POA: Diagnosis not present

## 2016-05-23 DIAGNOSIS — I5032 Chronic diastolic (congestive) heart failure: Secondary | ICD-10-CM

## 2016-05-23 DIAGNOSIS — Z952 Presence of prosthetic heart valve: Secondary | ICD-10-CM | POA: Diagnosis not present

## 2016-05-23 DIAGNOSIS — Z170 Estrogen receptor positive status [ER+]: Principal | ICD-10-CM

## 2016-05-23 DIAGNOSIS — I4719 Other supraventricular tachycardia: Secondary | ICD-10-CM

## 2016-05-23 DIAGNOSIS — I471 Supraventricular tachycardia: Secondary | ICD-10-CM

## 2016-05-23 DIAGNOSIS — I10 Essential (primary) hypertension: Secondary | ICD-10-CM

## 2016-05-23 DIAGNOSIS — C50211 Malignant neoplasm of upper-inner quadrant of right female breast: Secondary | ICD-10-CM

## 2016-05-23 LAB — BASIC METABOLIC PANEL
BUN: 37 mg/dL — ABNORMAL HIGH (ref 7–25)
CO2: 36 mmol/L — ABNORMAL HIGH (ref 20–31)
Calcium: 9.3 mg/dL (ref 8.6–10.4)
Chloride: 98 mmol/L (ref 98–110)
Creat: 1.66 mg/dL — ABNORMAL HIGH (ref 0.60–0.93)
Glucose, Bld: 93 mg/dL (ref 65–99)
Potassium: 4.1 mmol/L (ref 3.5–5.3)
Sodium: 141 mmol/L (ref 135–146)

## 2016-05-23 LAB — BRAIN NATRIURETIC PEPTIDE: Brain Natriuretic Peptide: 243.3 pg/mL — ABNORMAL HIGH (ref <100)

## 2016-05-23 LAB — CBC
HCT: 32.7 % — ABNORMAL LOW (ref 35.0–45.0)
Hemoglobin: 10.3 g/dL — ABNORMAL LOW (ref 11.7–15.5)
MCH: 30.3 pg (ref 27.0–33.0)
MCHC: 31.5 g/dL — ABNORMAL LOW (ref 32.0–36.0)
MCV: 96.2 fL (ref 80.0–100.0)
MPV: 9.1 fL (ref 7.5–12.5)
Platelets: 170 10*3/uL (ref 140–400)
RBC: 3.4 MIL/uL — ABNORMAL LOW (ref 3.80–5.10)
RDW: 14.2 % (ref 11.0–15.0)
WBC: 7.3 10*3/uL (ref 3.8–10.8)

## 2016-05-23 NOTE — Progress Notes (Signed)
  Radiation Oncology         [phone]) [phone] ________________________________  Name: Hannah [name] MRN: [id number]  Date: 05/23/2016  DOB: 1945-06-19  Simulation Verification Note    ICD-9-CM ICD-10-CM   1. Malignant neoplasm of upper-inner quadrant of right breast in female, estrogen receptor positive ([place]) 174.2 C50.211    V86.0 Z17.0     Status: outpatient  NARRATIVE: The patient was brought to the treatment unit and placed in the planned treatment position. The clinical setup was verified. Then port films were obtained and uploaded to the radiation oncology medical record software.  The treatment beams were carefully compared against the planned radiation fields. The position location and shape of the radiation fields was reviewed. They targeted volume of tissue appears to be appropriately covered by the radiation beams. Organs at risk appear to be excluded as planned.  Based on my personal review, I approved the simulation verification. The patient's treatment will proceed as planned.  -----------------------------------  Blair Promise, PhD, MD

## 2016-05-23 NOTE — Patient Instructions (Addendum)
We will be checking the following labs today - BMET, BNP and CBC   Medication Instructions:    Continue with your current medicines.     Testing/Procedures To Be Arranged:  N/A  Follow-Up:   See me in 3 weeks    Other Special Instructions:   N/A    If you need a refill on your cardiac medications before your next appointment, please call your pharmacy.   Call the [place] office at [phone] if you have any questions, problems or concerns.

## 2016-05-24 ENCOUNTER — Ambulatory Visit
Admission: RE | Admit: 2016-05-24 | Discharge: 2016-05-24 | Disposition: A | Discharge status: Home / Self Care | Payer: Medicare Other | Source: Ambulatory Visit | Attending: Radiation Oncology | Admitting: Radiation Oncology

## 2016-05-24 DIAGNOSIS — Z510 Encounter for antineoplastic radiation therapy: Secondary | ICD-10-CM | POA: Diagnosis not present

## 2016-05-25 ENCOUNTER — Telehealth: Payer: Self-pay | Admitting: Nurse Practitioner

## 2016-05-25 ENCOUNTER — Ambulatory Visit (INDEPENDENT_AMBULATORY_CARE_PROVIDER_SITE_OTHER): Payer: Medicare Other | Admitting: Emergency Medicine

## 2016-05-25 ENCOUNTER — Ambulatory Visit
Admission: RE | Admit: 2016-05-25 | Discharge: 2016-05-25 | Disposition: A | Discharge status: Home / Self Care | Payer: Medicare Other | Source: Ambulatory Visit | Attending: Radiation Oncology | Admitting: Radiation Oncology

## 2016-05-25 ENCOUNTER — Ambulatory Visit: Payer: Medicare Other | Admitting: Physical Therapy

## 2016-05-25 ENCOUNTER — Encounter: Payer: Self-pay | Admitting: Emergency Medicine

## 2016-05-25 DIAGNOSIS — J45909 Unspecified asthma, uncomplicated: Secondary | ICD-10-CM

## 2016-05-25 DIAGNOSIS — J45998 Other asthma: Secondary | ICD-10-CM

## 2016-05-25 DIAGNOSIS — Z510 Encounter for antineoplastic radiation therapy: Secondary | ICD-10-CM | POA: Diagnosis not present

## 2016-05-25 NOTE — Progress Notes (Signed)
Subjective:    Patient ID: Hannah [name], female    DOB: 1945-06-14, 70 y.o.   MRN: [id number]   HPI 70 yo never smoker, hx of CAD/CABG, aortic valve replacement, A Fib. Also with hx of childhood asthma. She was diagnosed with adult asthma in her 54's in setting wheeze, SOB, cough that wouldn't go away. Was on prn SABA for a while, started on Symbicort early 2009. Had PFT's done in August 2009 and 2010 that showed mixed disease. In 10/10 FEV1 1.01L. Averages Prednisone about 3 - 4x a year. Never hospitalized or on MV due to breathing. Amiodarone stopped late 2010.   ROV 10/14/09 -- returns for f/u of her asthma, restrictive disease. Tells me that she has had PND, allergy symptoms this spring, making her mor SOB and giving her more cough.   ROV 03/07/10 -- Hx CAD/CABG, asthma and allergies. Still with some nasal congestion, doing loratadine and fluticasone spray, rarely [place]. Does some throat clearing. Taking her symbicort reliably. Uses ventolin a couple times a month.   ROV 10/04/10 -- Hx CAD/CABG, asthma and allergies. Has been having more trouble with nasal gtt and allergies and therefore more cough chest tightness. Using Symbicort. Has needed ventolin about 4x a day for last week. More SOB. Can't tolerate the NSWs.   ROV 04/24/11 -- Hx CAD/CABG, asthma and allergies. Here for follow up. She was treated over phone for bronchitis after URI - better after doxy and prednisone. Still on Symbicort bid. She is much improved, back to baseline. She has had the flu shot. She is rarely using SABA - was requiring frequently when she was exacerbated. She is having several AE a year.   ROV 02/22/12 -- Hx CAD/CABG, asthma and allergies. She is undergoing eval for HA, nausea, imbalance. May have had a small CVA - had MRI and neuro eval. Sometimes these episodes are associated with SOB. She remains on Symbicort.  She is on loratadine, uses nasal steroid prn.   ROV 09/09/12 -- Hx CAD/CABG, asthma and allergies. She is  starting to have nasal gtt and cough. She can't do [place]. Interested in changing loratadine to alternative. She doesn't like the nasal steroids because they run down her throat. Still on Symbicort.   ROV 03/17/13 -- Hx CAD/CABG, asthma and allergies.  Returns for f/u. She keeps a baseline cough. No asthma flares since last time. Remains on Symbicort, needs a spacer. Went back to loratadine. Not using nasal washes or steroids. She has had Flu Shot.   ROV 01/12/14 -- follows for asthma, allergies, cough. She tells me that she has been doing well, is on symbicort, rarely needs albuterol. Takes loratadine, not currently on nasal steroid. She had Prevnar-13 in Sept '14. No exacerbations!  ROV 07/21/15 -- patient has a history of rhinitis, chronic cough, mixed obstruction and restriction on pulmonary function testing from 2010. She was seen in our office for an acute exacerbation approximately a year ago and then again in May 2016. I have not seen her since August 2015. She is reliable with her Symbicort.  She is on xyzal. She had a sick contact from grandchild. She is having some increase in nasal gtt, scratchy throat.  She has nasacort but isn't currently using.   ROV 08/01/15 -- follow-up visit for patient with a history of chronic rhinitis, chronic cough, mixed restriction and obstruction on pulmonary function testing. At her last visit she was having increased rhinitis and we restarted Nasacort, continued xyzal, started chlorpheniramine. She was initially  doing better until about 3 nights ago, had more cough, more dyspnea. Having L > R rib soreness. She is coughing up brown mucous x 3 days, no fever.   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 She also has a history of coronary artery disease, atrial fibrillation, chronic diastolic CHF.  She has remained on xyzal, nasacort. She is using Symbicort. She has ventolin that she rarely uses. She is  doing quite well recently. She does have some hoarse voice and some drainage but is has not manifested itself as severe cough as it has done in past years. She has been avoiding the outside exposures to the best of her ability.  ROV 05/25/16 -- she has a history of coronary disease, age fibrillation, chronic diastolic CHF. She also has mixed obstruction and restriction consistent with COPD with associated chronic rhinitis and chronic cough. She is recently diagnosed with right sided breast cancer status post surgery, total therapy, and radiation therapy. She was seen in our office Hannah [name]/15/17 at which time she was having more dyspnea and more edema, orthopnea.  Her diuretics were adjusted with significant improvement in weight and breathing. On symbicort reliably, rare albuterol use. Her breathing and weight are better. Just started on XRT. She is not coughing or wheezing.''                                                                                                                                                                            Review of Systems as per HPI    Objective:   Physical Exam Vitals:   05/25/16 1138 05/25/16 1139  BP:  120/62  Pulse:  80  SpO2:  100%  Weight: 208 lb (94.3 kg)   Height: 5\' 5"  (1.651 m)    Gen: Pleasant, obese, in no distress,  normal affect  ENT: No lesions,  mouth clear,  oropharynx clear, no postnasal drip  Neck: No JVD, no TMG, no carotid bruits  Lungs: No use of accessory muscles, clear without rales or rhonchi  Cardiovascular: RRR, mechanical second heart sound  Musculoskeletal: No deformities, no cyanosis or clubbing  Neuro: alert, non focal  Skin: Warm, no lesions or rashes   Assessment & Plan:  Asthma Well compensated at this time. Her recent dyspnea has improved significantly as the chemo has ended and the diuretics have been more effective.   Please continue your Symbicort twice a day as you have been taking it.  Take albuterol  2 puffs up to every 4 hours if needed for shortness of breath.  Please continue to follow with cardiology to manage your diuretics.  Follow with Dr Lamonte Sakai in 6 months or  sooner if you have any problems   Baltazar Apo, MD, PhD 05/25/2016, 12:07 PM [facility] [phone] or if no answer [phone]

## 2016-05-25 NOTE — Telephone Encounter (Signed)
New message ° °Pt is returning call  ° °Please call back °

## 2016-05-25 NOTE — Assessment & Plan Note (Signed)
Well compensated at this time. Her recent dyspnea has improved significantly as the chemo has ended and the diuretics have been more effective.   Please continue your Symbicort twice a day as you have been taking it.  Take albuterol 2 puffs up to every 4 hours if needed for shortness of breath.  Please continue to follow with cardiology to manage your diuretics.  Follow with Dr Lamonte Sakai in 6 months or sooner if you have any problems

## 2016-05-25 NOTE — Patient Instructions (Signed)
Please continue your Symbicort twice a day as you have been taking it.  Take albuterol 2 puffs up to every 4 hours if needed for shortness of breath.  Please continue to follow with cardiology to manage your diuretics.  Follow with Dr Lamonte Sakai in 6 months or sooner if you have any problems

## 2016-05-28 ENCOUNTER — Ambulatory Visit
Admission: RE | Admit: 2016-05-28 | Discharge: 2016-05-28 | Disposition: A | Discharge status: Home / Self Care | Payer: Medicare Other | Source: Ambulatory Visit | Attending: Radiation Oncology | Admitting: Radiation Oncology

## 2016-05-28 DIAGNOSIS — Z510 Encounter for antineoplastic radiation therapy: Secondary | ICD-10-CM | POA: Diagnosis not present

## 2016-05-29 ENCOUNTER — Ambulatory Visit
Admission: RE | Admit: 2016-05-29 | Discharge: 2016-05-29 | Disposition: A | Discharge status: Home / Self Care | Payer: Medicare Other | Source: Ambulatory Visit | Attending: Radiation Oncology | Admitting: Radiation Oncology

## 2016-05-29 ENCOUNTER — Ambulatory Visit: Payer: Medicare Other

## 2016-05-29 ENCOUNTER — Encounter: Payer: Self-pay | Admitting: Radiation Oncology

## 2016-05-29 VITALS — BP 122/52 | HR 81 | Temp 98.5°F | Ht 65.0 in | Wt 206.0 lb

## 2016-05-29 DIAGNOSIS — Z170 Estrogen receptor positive status [ER+]: Principal | ICD-10-CM

## 2016-05-29 DIAGNOSIS — R601 Generalized edema: Secondary | ICD-10-CM | POA: Diagnosis not present

## 2016-05-29 DIAGNOSIS — Z510 Encounter for antineoplastic radiation therapy: Secondary | ICD-10-CM | POA: Diagnosis not present

## 2016-05-29 DIAGNOSIS — C50211 Malignant neoplasm of upper-inner quadrant of right female breast: Secondary | ICD-10-CM | POA: Insufficient documentation

## 2016-05-29 DIAGNOSIS — M6281 Muscle weakness (generalized): Secondary | ICD-10-CM

## 2016-05-29 DIAGNOSIS — M25611 Stiffness of right shoulder, not elsewhere classified: Secondary | ICD-10-CM

## 2016-05-29 DIAGNOSIS — R2681 Unsteadiness on feet: Secondary | ICD-10-CM

## 2016-05-29 MED ORDER — ALRA NON-METALLIC DEODORANT (RAD-ONC)
1.0000 "application " | Freq: Once | TOPICAL | Status: AC
  Administered 2016-05-29: 1 via TOPICAL

## 2016-05-29 MED ORDER — RADIAPLEXRX EX GEL
Freq: Once | CUTANEOUS | Status: AC
  Administered 2016-05-29: 15:00:00 via TOPICAL

## 2016-05-29 NOTE — Progress Notes (Signed)
  Radiation Oncology         [phone] ________________________________  Name: Hannah [name] MRN: [id number]  Date: 05/29/2016  DOB: [birth year]  Weekly Radiation Therapy Management    ICD-9-CM ICD-10-CM   1. Malignant neoplasm of upper-inner quadrant of right breast in female, estrogen receptor positive ([place]) 174.2 C50.211    V86.0 Z17.0      Current Dose: 9.0 Gy     Planned Dose:  50.4 Gy  Narrative . . . . . . . . The patient presents for routine under treatment assessment.                                    Markell Schrier has completed 4 fractions to her right breast.  She denies having pain and fatigue.  The skin on her right breast is pink.                                  Set-up films were reviewed.                                 The chart was checked. Physical Findings. . .  height is 5\' 5"  (1.651 m) and weight is 206 lb (93.4 kg). Her oral temperature is 98.5 F (36.9 C). Her blood pressure is 122/52 (abnormal) and her pulse is 81. Her oxygen saturation is 97%. . Weight essentially stable.  No significant skin reaction. Impression . . . . . . . The patient is tolerating radiation. Plan . . . . . . . . . . . . Continue treatment as planned.  ________________________________   Blair Promise, PhD, MD  This document serves as a record of services personally performed by Gery Pray, MD. It was created on his behalf by Darcus Austin, a trained medical scribe. The creation of this record is based on the scribe's personal observations and the provider's statements to them. This document has been checked and approved by the attending provider.

## 2016-05-29 NOTE — Progress Notes (Signed)
Hannah [name] has completed 4 fractions to her right breast.  She denies having pain and fatigue.  The skin on her right breast is pink.  BP (!) 122/52 (BP Location: Left Arm, Patient Position: Sitting)   Pulse 81   Temp 98.5 F (36.9 C) (Oral)   Ht 5\' 5"  (1.651 m)   Wt 206 lb (93.4 kg)   SpO2 97%   BMI 34.28 kg/m    Wt Readings from Last 3 Encounters:  05/29/16 206 lb (93.4 kg)  05/25/16 208 lb (94.3 kg)  05/23/16 206 lb (93.4 kg)

## 2016-05-29 NOTE — Therapy (Addendum)
[place], [place], [address] Phone: [phone]   Fax:  [phone]  Physical Therapy Treatment  Patient Details  Name: Hannah [name] MRN: [id number] Date of Birth: 02-20-1946 Referring Provider: Dr. Nicholas Lose  Encounter Date: 05/29/2016      PT End of Session - 05/29/16 1145    Visit Number 6   Number of Visits 13   Date for PT Re-Evaluation 06/15/16   PT Start Time 1104   PT Stop Time 1137  Pt reported feeling very fatigued today and requested ending session early due to this.   PT Time Calculation (min) 33 min   Activity Tolerance Patient tolerated treatment well;Patient limited by fatigue   Behavior During Therapy [facility] for tasks assessed/performed      Past Medical History:  Diagnosis Date  . Abnormally small mouth   . Anemia   . Bilateral carotid artery stenosis    Bilateral ICA 40-59%/  >50% LECA  . Breast cancer ([place])   . Breast cancer of upper-inner quadrant of right female breast ([place]) 12/16/2015  . Bruises easily   . CAD (coronary artery disease) cardiologist-  dr Aundra Dubin   Remote CABG in 1998 with redo in 2009  . CHF (congestive heart failure) (HCC)    EF is low normal at 50 to 55% per echo in Jan. 2011  . Dyspnea   . GERD (gastroesophageal reflux disease)   . Hearing loss of right ear   . History of non-ST elevation myocardial infarction (NSTEMI)    Sept 2014--  thought to be type II HTN w/ LHC without infarct related artery and patent grafts  . Hyperlipidemia   . Hypertension   . Iron deficiency anemia   . Moderate persistent asthma    pulmologist-  Dr. Malvin Johns  . Nausea & vomiting    related to stone  . Nonischemic cardiomyopathy ([place])   . PAF (paroxysmal atrial fibrillation) ([place])   . PONV (postoperative nausea and vomiting)    pt states patch worked well before  . Psoriasis    right leg  . Renal calculus, right   . Renal insufficiency, mild   . S/P AVR    prosthesis valve placement 2009 at same time re-do CABG  . 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 Type 2 diabetes mellitus ([place])    monitored by dr Dwyane Dee    Past Surgical History:  Procedure Laterality Date  . AORTIC VALVE REPLACEMENT  2009   [facility] (done same time is CABG)  . BREAST LUMPECTOMY WITH RADIOACTIVE SEED AND SENTINEL LYMPH NODE BIOPSY Right 01/18/2016   Procedure: RIGHT BREAST LUMPECTOMY WITH RADIOACTIVE SEED AND SENTINEL LYMPH NODE BIOPSY;  Surgeon: DAlphonsa Overall MD;  [place]  Service: General;  Laterality: Right;  . CARDIAC CATHETERIZATION  03/23/2008   Pre-redo CABG: L main OK, LAD (T), CFX (T), OM1 99%, RCA (T), LIMA-LAD OK, SVG-OM(?3) OK w/ little florw to OM2, SVG-RCA OK. EF NL  . CARDIOVASCULAR STRESS TEST  06-15-2013      dr hDebara Pickett  [place]perfusion study/  no evidence ischemia/  normal wall motion, ef 72%  . CARPAL TUNNEL RELEASE    . COLONOSCOPY    . CORONARY ARTERY BYPASS GRAFT  1998 &  re-do 2009   Had LIMA to DX/LAD, SVG to 2 marginal branches and [facility]; SVG to 3rd OM and PD at time of redo  .  CYSTOSCOPY W/ URETERAL STENT PLACEMENT Right 12/20/2014   Procedure: CYSTOSCOPY WITH RETROGRADE PYELOGRAM/URETERAL STENT PLACEMENT;  Surgeon: Cleon Gustin, MD;  Location: [facility];  Service: Urology;  Laterality: Right;  . EYE SURGERY Bilateral    cataracts  . HOLMIUM LASER APPLICATION Right 09/11/4740   Procedure:  HOLMIUM LASER LITHOTRIPSY;  Surgeon: Cleon Gustin, MD;  Location: [facility];  Service: Urology;  Laterality: Right;  . LEFT HEART CATHETERIZATION WITH CORONARY/GRAFT ANGIOGRAM N/A 02/23/2013   Procedure: LEFT HEART CATHETERIZATION WITH Beatrix Fetters;  Surgeon: Blane Ohara, MD;  Location: [facility] CATH LAB;  Service: Cardiovascular;  Laterality: N/A;  . PORTACATH PLACEMENT Left 01/18/2016   Procedure: INSERTION PORT-A-CATH;  Surgeon: Alphonsa Overall,  MD;  Location: [place];  Service: General;  Laterality: Left;  . TONSILLECTOMY    . TRANSTHORACIC ECHOCARDIOGRAM  02-24-2013      mild LVH,  ef 50-55%/  AV bioprosthesis was present with very mild stenosis and no regurg., mean grandient 32mHg, peak grandient 269mg /  mild MR/  mild LAE and RAE/  moderate TR  . TUBAL LIGATION      There were no vitals filed for this visit.      Subjective Assessment - 05/29/16 1109    Subjective I felt okay after last session and was able to run errands after so I was happy about that. I was feeling a little more winded this morning but I used my inhaler and I'm feeling better now.    Pertinent History Diagnosed with right breast cancer and had two lumpectomies in August with sentinel lymph node biospy (negative).  Finished chemo in late October.  Hospitalized for left leg cellulitis the last week in October and just finished antibiotic.  Will start radiation in December (assessed 05/14/16).  Diabetes and has a pump; atrial fibrillation.  Neuropathy in feet, toes especially, mostly numbness and some pain.  h/o CABG and aortic valve replacement, the latter in 2008  h/o broken right shoulder eight years ago, and reports difficulty holding the right arm up.   Patient Stated Goals "to get where I can walk better and get around, and get back to my normal life"   Currently in Pain? No/denies                         [facility]/Exercise - 05/29/16 0001      Knee/Hip Exercises: Aerobic   Nustep Level 3 x 7 minutes     Knee/Hip Exercises: Standing   Hip Flexion Stengthening;Both;10 reps;Knee bent  Alt march, 2 lbs each ankle on Airex at back of bike   Hip Abduction Stengthening;Both;10 reps;Knee straight  2 lbs each ankle on Airex at back of bike   Hip Extension Stengthening;Both;10 reps;Knee straight  2 lbs each ankle on Airex at back of bike     Knee/Hip Exercises: Seated   Long Arc Quad Strengthening;10 reps;Weights;Right;Left    Long Arc Quad Weight 2 lbs.   Knee/Hip Flexion Marching 20 times each leg, 2 lbs each ankle  Lt LE ROM was improved today     Shoulder Exercises: Pulleys   Flexion 2 minutes   Flexion Limitations VC to decrease scapular compensations   ABduction 2 minutes                   Short Term Clinic Goals - 05/18/16 1127      CC Short Term Goal  #1  Title Patient will be independent in HEP including LE strengthening, right shoulder ROM   Status On-going     CC Short Term Goal  #2   Title Patient's family members will be knowledgeable about correct compression bandaging for both LEs   Status Achieved             Long Term Clinic Goals - 05/18/16 1128      CC Long Term Goal  #5   Title right shoulder active flexion and abduction to at least 100 degrees for improved reach and ADLs   Status On-going            Plan - 05/29/16 1145    Clinical Impression Statement Pt was very fatigued today and wasn't able to tolerate entire therapy session due to this. She reports overall she is able to do more throughout day and walk farther, she just still has days where she feels more fatigued than usual and today is one of those days.      Rehab Potential Excellent   Clinical Impairments Affecting Rehab Potential none   PT Frequency 2x / week   PT Duration 6 weeks   PT Treatment/Interventions ADLs/Self Care Home Management;Electrical Stimulation;DME Instruction;Gait training;Stair training;Functional mobility training;Therapeutic exercise;Balance training;Neuromuscular re-education;Patient/family education;Manual techniques;Manual lymph drainage;Compression bandaging;Passive range of motion   PT Next Visit Plan Continue Nustep and strengthening.  Consider DGI or gait speed; progress to endurance exercise and balance re-ed as pt tolerates.    Consulted and Agree with Plan of Care Patient      Patient will benefit from skilled therapeutic intervention in order to improve the  following deficits and impairments:  Decreased activity tolerance, Decreased balance, Decreased endurance, Decreased knowledge of use of DME, Decreased range of motion, Decreased strength, Increased edema, Impaired sensation, Pain, Decreased knowledge of precautions  Visit Diagnosis: Generalized edema  Muscle weakness (generalized)  Unsteadiness on feet  Stiffness of right shoulder, not elsewhere classified     Problem List Patient Active Problem List   Diagnosis Date Noted  . Chemotherapy-induced peripheral neuropathy ([place]) 04/27/2016  . 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 Cellulitis of left leg 04/06/2016  . Osteomyelitis of toe of left foot ([place]) 04/06/2016  . Antineoplastic chemotherapy induced anemia 03/09/2016  . Mania ([place]) 03/05/2016  . GERD (gastroesophageal reflux disease) 02/29/2016  . Chemotherapy induced nausea and vomiting 02/24/2016  . Port catheter in place 02/17/2016  . Breast cancer of upper-inner quadrant of right female breast ([place]) 12/16/2015  . Acute bronchitis 08/01/2015  . CKD (chronic kidney disease) 09/24/2013  . Chronic diastolic CHF (congestive heart failure) ([place]) 09/24/2013  . Acute on chronic combined systolic and diastolic CHF, NYHA class 3 ([place]) 07/17/2013  . Chest pain 06/15/2013  . Unstable angina ([place]) 06/14/2013  . Atrial tachycardia ([place]) 04/03/2013  . Non-ST elevation myocardial infarction (NSTEMI), initial episode of care ([place]) 02/23/2013  . CAD (coronary artery disease) 01/24/2011  . S/P aortic valve replacement 10/27/2010  . Hypertension   . Hyperlipidemia   . SOB (shortness of breath)   . CHF (congestive heart failure) ([place])   . Renal insufficiency, mild   . Dyspnea   . Nonischemic cardiomyopathy ([place])   . Anemia   . Diabetes mellitus, type 2 ([place])   . Iron deficiency anemia   . Allergic rhinitis 10/14/2009  . COUGH 10/14/2009  . HYPERLIPIDEMIA 05/12/2009  . HYPERTENSION 05/12/2009  . Atrial fibrillation  ([place]) 05/12/2009  . Asthma 05/12/2009  . DYSPNEA 05/12/2009    Otelia Limes,  PTA 05/29/2016, 11:52 AM  [place], [place], [address] Phone: [phone]   Fax:  [phone]  Name: Hannah [name] MRN: [id number] Date of Birth: 1945/12/08   PHYSICAL THERAPY DISCHARGE SUMMARY  Visits from Start of Care: 6  Current functional level related to goals / functional outcomes: Goals partially met as described above.   Remaining deficits: Unknown: Plan of care was for another three weeks or so of therapy, but patient did not continue after the visit dated today.    Education / Equipment: HEP Plan: Patient agrees to discharge.  Patient goals were partially met. Patient is being discharged due to not returning since the last visit.  ?????    Serafina Royals, PT 08/22/17 3:52 PM

## 2016-05-29 NOTE — Progress Notes (Signed)
Pt here for patient teaching.  Pt given Radiation and You booklet, skin care instructions, [place]ra deodorant and Radiaplex gel. Pt reports they have not watched the Radiation Therapy Education video and has been given the link to watch at home. Reviewed areas of pertinence such as fatigue, skin changes, breast tenderness and breast swelling . Pt able to give teach back of to pat skin and use unscented/gentle soap,apply Radiaplex bid and avoid applying anything to skin within 4 hours of treatment. Pt demonstrated understanding and verbalizes understanding of information given and will contact nursing with any questions or concerns.

## 2016-05-30 ENCOUNTER — Ambulatory Visit
Admission: RE | Admit: 2016-05-30 | Discharge: 2016-05-30 | Disposition: A | Discharge status: Home / Self Care | Payer: Medicare Other | Source: Ambulatory Visit | Attending: Radiation Oncology | Admitting: Radiation Oncology

## 2016-05-30 ENCOUNTER — Encounter: Payer: Medicare Other | Admitting: Physical Therapy

## 2016-05-30 DIAGNOSIS — Z510 Encounter for antineoplastic radiation therapy: Secondary | ICD-10-CM | POA: Diagnosis not present

## 2016-05-31 ENCOUNTER — Ambulatory Visit
Admission: RE | Admit: 2016-05-31 | Discharge: 2016-05-31 | Disposition: A | Discharge status: Home / Self Care | Payer: Medicare Other | Source: Ambulatory Visit | Attending: Radiation Oncology | Admitting: Radiation Oncology

## 2016-05-31 DIAGNOSIS — Z510 Encounter for antineoplastic radiation therapy: Secondary | ICD-10-CM | POA: Diagnosis not present

## 2016-06-01 ENCOUNTER — Ambulatory Visit: Payer: Medicare Other | Admitting: Physical Therapy

## 2016-06-01 ENCOUNTER — Ambulatory Visit
Admission: RE | Admit: 2016-06-01 | Discharge: 2016-06-01 | Disposition: A | Discharge status: Home / Self Care | Payer: Medicare Other | Source: Ambulatory Visit | Attending: Radiation Oncology | Admitting: Radiation Oncology

## 2016-06-01 DIAGNOSIS — Z510 Encounter for antineoplastic radiation therapy: Secondary | ICD-10-CM | POA: Diagnosis not present

## 2016-06-05 ENCOUNTER — Ambulatory Visit
Admission: RE | Admit: 2016-06-05 | Discharge: 2016-06-05 | Disposition: A | Discharge status: Home / Self Care | Payer: Medicare Other | Source: Ambulatory Visit | Attending: Radiation Oncology | Admitting: Radiation Oncology

## 2016-06-05 ENCOUNTER — Encounter: Payer: Self-pay | Admitting: Radiation Oncology

## 2016-06-05 VITALS — BP 113/58 | HR 117 | Temp 98.1°F | Ht 65.0 in | Wt 205.0 lb

## 2016-06-05 DIAGNOSIS — Z510 Encounter for antineoplastic radiation therapy: Secondary | ICD-10-CM | POA: Diagnosis not present

## 2016-06-05 DIAGNOSIS — C50211 Malignant neoplasm of upper-inner quadrant of right female breast: Secondary | ICD-10-CM

## 2016-06-05 DIAGNOSIS — Z170 Estrogen receptor positive status [ER+]: Principal | ICD-10-CM

## 2016-06-05 NOTE — Progress Notes (Signed)
   Weekly Management Note:  Outpatient    ICD-9-CM ICD-10-CM   1. Malignant neoplasm of upper-inner quadrant of right breast in female, estrogen receptor positive (HCC) 174.2 C50.211    V86.0 Z17.0     Current Dose:  14.4 Gy  Projected Dose: 50.4 Gy   Narrative:  The patient presents for routine under treatment assessment.  CBCT/MVCT images/Port film x-rays were reviewed.  The chart was checked.   Ms. Hannah [name] is here for her 8th fraction of radiation to her Right Breast. She denies pain, but does report occasional pain in her Left foot from neuropathy. She denies fatigue. She does have redness over her Right Breast. She is using Radiaplex twice daily as directed.   Physical Findings:  height is 5\' 5"  (1.651 m) and weight is 205 lb (93 kg). Her temperature is 98.1 F (36.7 C). Her blood pressure is 113/58 (abnormal) and her pulse is 117 (abnormal). Her oxygen saturation is 100%.   Wt Readings from Last 3 Encounters:  06/05/16 205 lb (93 kg)  05/29/16 206 lb (93.4 kg)  05/25/16 208 lb (94.3 kg)   Central erythema over the right breast, skin is intact.   Impression:  The patient is tolerating radiotherapy.  Plan:  Continue radiotherapy as planned. She will apply radiaplex as directed.   ________________________________   Eppie Gibson, M.D.   This document serves as a record of services personally performed by Eppie Gibson, MD. It was created on her behalf by Arlyce Harman, a trained medical scribe. The creation of this record is based on the scribe's personal observations and the provider's statements to them. This document has been checked and approved by the attending provider.

## 2016-06-05 NOTE — Progress Notes (Signed)
Ms. Hannah [name] is here for her 8th fraction of radiation to her Right Breast. She denies pain, but does report occasional pain in her Left foot from neuropathy. She denies fatigue. She does have redness over her Right Breast. She is using Radiaplex twice daily as directed.   BP (!) 113/58   Pulse (!) 117   Temp 98.1 F (36.7 C)   Ht 5\' 5"  (1.651 m)   Wt 205 lb (93 kg)   SpO2 100% Comment: room air  BMI 34.11 kg/m    Wt Readings from Last 3 Encounters:  06/05/16 205 lb (93 kg)  05/29/16 206 lb (93.4 kg)  05/25/16 208 lb (94.3 kg)

## 2016-06-06 ENCOUNTER — Ambulatory Visit
Admission: RE | Admit: 2016-06-06 | Discharge: 2016-06-06 | Disposition: A | Discharge status: Home / Self Care | Payer: Medicare Other | Source: Ambulatory Visit | Attending: Radiation Oncology | Admitting: Radiation Oncology

## 2016-06-06 DIAGNOSIS — Z510 Encounter for antineoplastic radiation therapy: Secondary | ICD-10-CM | POA: Diagnosis not present

## 2016-06-07 ENCOUNTER — Ambulatory Visit
Admission: RE | Admit: 2016-06-07 | Discharge: 2016-06-07 | Disposition: A | Discharge status: Home / Self Care | Payer: Medicare Other | Source: Ambulatory Visit | Attending: Radiation Oncology | Admitting: Radiation Oncology

## 2016-06-07 DIAGNOSIS — Z510 Encounter for antineoplastic radiation therapy: Secondary | ICD-10-CM | POA: Diagnosis not present

## 2016-06-08 ENCOUNTER — Ambulatory Visit
Admission: RE | Admit: 2016-06-08 | Discharge: 2016-06-08 | Disposition: A | Discharge status: Home / Self Care | Payer: Medicare Other | Source: Ambulatory Visit | Attending: Radiation Oncology | Admitting: Radiation Oncology

## 2016-06-08 DIAGNOSIS — Z510 Encounter for antineoplastic radiation therapy: Secondary | ICD-10-CM | POA: Diagnosis not present

## 2016-06-11 HISTORY — PX: OTHER SURGICAL HISTORY: SHX169

## 2016-06-12 ENCOUNTER — Ambulatory Visit
Admission: RE | Admit: 2016-06-12 | Discharge: 2016-06-12 | Disposition: A | Discharge status: Home / Self Care | Payer: Medicare Other | Source: Ambulatory Visit | Attending: Radiation Oncology | Admitting: Radiation Oncology

## 2016-06-12 ENCOUNTER — Encounter: Payer: Self-pay | Admitting: Radiation Oncology

## 2016-06-12 ENCOUNTER — Ambulatory Visit: Payer: Medicare Other

## 2016-06-12 VITALS — BP 136/40 | HR 66 | Temp 98.1°F | Resp 16 | Wt 207.6 lb

## 2016-06-12 DIAGNOSIS — Z170 Estrogen receptor positive status [ER+]: Principal | ICD-10-CM

## 2016-06-12 DIAGNOSIS — C50211 Malignant neoplasm of upper-inner quadrant of right female breast: Secondary | ICD-10-CM

## 2016-06-12 DIAGNOSIS — Z510 Encounter for antineoplastic radiation therapy: Secondary | ICD-10-CM | POA: Diagnosis not present

## 2016-06-12 NOTE — Progress Notes (Signed)
  Radiation Oncology         [phone] ________________________________  Name: Hannah [name] MRN: [id number]  Date: 06/12/2016  DOB: 1946-03-18  Weekly Radiation Therapy Management    ICD-9-CM ICD-10-CM   1. Malignant neoplasm of upper-inner quadrant of right breast in female, estrogen receptor positive ([place]) 174.2 C50.211    V86.0 Z17.0      Current Dose: 21.6Gy     Planned Dose:  50.4 Gy  Narrative . . . . . . . . The patient presents for routine under treatment assessment.                                   Weekly rad txs right breast, 12/33 completed. Mild erythema, skin intact, using radiaplex bid. No c/o pain, appetite good, mild fatigue, naps during the day.                                  Set-up films were reviewed.                                 The chart was checked. Physical Findings. . .  weight is 207 lb 9.6 oz (94.2 kg). Her oral temperature is 98.1 F (36.7 C). Her blood pressure is 136/40 (abnormal) and her pulse is 66. Her respiration is 16. . Weight essentially stable. Mild hyperpigmentation changes in the right breast. Impression . . . . . . . The patient is tolerating radiation. Plan . . . . . . . . . . . . Continue treatment as planned.  ________________________________   Blair Promise, PhD, MD  This document serves as a record of services personally performed by Gery Pray, MD. It was created on his behalf by Darcus Austin, a trained medical scribe. The creation of this record is based on the scribe's personal observations and the provider's statements to them. This document has been checked and approved by the attending provider.

## 2016-06-12 NOTE — Progress Notes (Signed)
Weekly rad txs right breast, 12/33 completed, mild erythema,a skin intact, using radiaplex bid,  No c/o pian, appetite good, mild fatigue, naps during the day 1:20 PM BP (!) 136/40 (BP Location: Left Arm, Patient Position: Sitting, Cuff Size: Normal)   Pulse 66   Temp 98.1 F (36.7 C) (Oral)   Resp 16   Wt 207 lb 9.6 oz (94.2 kg)   BMI 34.55 kg/m   Wt Readings from Last 3 Encounters:  06/12/16 207 lb 9.6 oz (94.2 kg)  06/05/16 205 lb (93 kg)  05/29/16 206 lb (93.4 kg)

## 2016-06-13 ENCOUNTER — Ambulatory Visit
Admission: RE | Admit: 2016-06-13 | Discharge: 2016-06-13 | Disposition: A | Discharge status: Home / Self Care | Payer: Medicare Other | Source: Ambulatory Visit | Attending: Radiation Oncology | Admitting: Radiation Oncology

## 2016-06-13 DIAGNOSIS — Z510 Encounter for antineoplastic radiation therapy: Secondary | ICD-10-CM | POA: Diagnosis not present

## 2016-06-14 ENCOUNTER — Ambulatory Visit
Admission: RE | Admit: 2016-06-14 | Discharge: 2016-06-14 | Disposition: A | Discharge status: Home / Self Care | Payer: Medicare Other | Source: Ambulatory Visit | Attending: Radiation Oncology | Admitting: Radiation Oncology

## 2016-06-14 DIAGNOSIS — Z510 Encounter for antineoplastic radiation therapy: Secondary | ICD-10-CM | POA: Diagnosis not present

## 2016-06-15 ENCOUNTER — Ambulatory Visit
Admission: RE | Admit: 2016-06-15 | Discharge: 2016-06-15 | Disposition: A | Discharge status: Home / Self Care | Payer: Medicare Other | Source: Ambulatory Visit | Attending: Radiation Oncology | Admitting: Radiation Oncology

## 2016-06-15 ENCOUNTER — Ambulatory Visit (INDEPENDENT_AMBULATORY_CARE_PROVIDER_SITE_OTHER): Payer: Medicare Other | Admitting: Nurse Practitioner

## 2016-06-15 ENCOUNTER — Encounter: Payer: Self-pay | Admitting: Nurse Practitioner

## 2016-06-15 VITALS — BP 150/60 | HR 77 | Ht 65.0 in | Wt 205.0 lb

## 2016-06-15 DIAGNOSIS — I5032 Chronic diastolic (congestive) heart failure: Secondary | ICD-10-CM | POA: Diagnosis not present

## 2016-06-15 DIAGNOSIS — Z510 Encounter for antineoplastic radiation therapy: Secondary | ICD-10-CM | POA: Diagnosis not present

## 2016-06-15 DIAGNOSIS — I2510 Atherosclerotic heart disease of native coronary artery without angina pectoris: Secondary | ICD-10-CM

## 2016-06-15 DIAGNOSIS — I10 Essential (primary) hypertension: Secondary | ICD-10-CM | POA: Diagnosis not present

## 2016-06-15 NOTE — Patient Instructions (Addendum)
We will be checking the following labs today - BMET and CBC   Medication Instructions:    Continue with your current medicines.     Testing/Procedures To Be Arranged:  N/A  Follow-Up:   See me in about 4 weeks.     Other Special Instructions:   N/A    If you need a refill on your cardiac medications before your next appointment, please call your pharmacy.   Call the [place] office at [phone] if you have any questions, problems or concerns.

## 2016-06-15 NOTE — Progress Notes (Signed)
CARDIOLOGY OFFICE NOTE  Date:  06/15/2016    Hannah [name] Date of Birth: 05/30/1946 Medical Record #[id number]  PCP:  Hannah Girt, DO  Cardiologist:  Hannah [name]    Chief Complaint  Patient presents with  . Cardiomyopathy    Follow up visit - seen for Hannah. Aundra [name]    History of Present Illness: Hannah [name] is a 71 y.o. female who presents today for a follow up visit. She is a former patient of Hannah. Susa [name]. Seen for Hannah. Aundra [name]. She now follows with me.   She has a history of CAD s/p CABG, bioprosthetic AVR, CKD, paroxysmal atrial fibrillation presents for cardiology followup. She had her initial CABG in 1998, then had redo CABG in 2009 with bioprosthetic AVR. She does not remember when she last had documented atrial fibrillation, but she thinks it was a number of years ago, sounds peri-operative around the time of her redo CABG. She was never put on anticoagulation but had been on dronedarone.   In 9/14, she developed tachypalpitations and later chest pain. In the ER, SBP was in the 200s with elevated HR. Troponin was mildly elevated. Therefore, she had a cardiac cath showing patent grafts. She was thought to have had demand ischemia in the setting of hypertensive emergency + tachycardia. After discharge, she continued to have runs of tachypalpitations. A 3 week event monitor was put on her. This showed runs, sometimes long, of atrial tachycardia. She was started on Toprol XL when these runs were noted. No further palpitations (complete resolution of symptoms).   In 1/15, she was admitted to [facility] with atypical chest pain. She was ruled out for MI and Lexiscan Cardiolite showed no ischemia. She was started on Imdur and sent home. After discharge, she developed a daily headache and nausea. She did not eat or drink much due to the nausea. Shortly after this, she was standing in her grandson's pediatrician's office and became severely lightheaded.  She had to sit down. She came to the ER and was found to be orthostatic. She was given IV fluid and got better quickly. The Imdur was stopped and symptoms seem to have completely resolved.   Event monitor in 8/16 showed atrial tachycardia versus atypical atrial flutter. Toprol XL was increased to 100 mg bid. She saw Hannah [name] who thought that she had short runs of atrial tachycardia and not atrial flutter. Therefore, she has not been anticoagulated. She hvaingoccasional brief palpitations but overall was felt to bedoing better.   She saw Hannah. Aundra [name] back in April of 2017 - she was doing well.   Has been diagnosed with breast cancer since her last visit here. Has had surgery. Getting chemo/radiation - no Adriamycin due to her history of CHF.  I last saw her back in October- getting chemo. Overall seemed to be holding her on.   I have seen her back several times over the past 6 weeks - very volume overload - unable to get her 4th dose of chemo due to severe fatigue,, neuropathy and cellulitis. Anemic. Referred back here from pulmonary. I diuresed her. Seen right after Thanksgiving - she was losing weight. Symptoms slowly improving. At last visit she continued to improve -weight was coming down but still above from when I saw her in October (198) - she was starting radiation.   Comes back today. Here alone today. She continues to improve but it is slow. Swelling is "alot better" but still not resolved. Using  her support stockings. Weight is basically unchanged. Needs labs today. Has had about 15 treatments of radiation - to finish at the end of this month. Trying to be more active but it is a 60 mile round trip to [place] - she does tire easily but overall sounds like she is trying to do more. Still gets winded with activities. No palpitations.   PMH: 1. Diabetes mellitus 2. HTN 3. Obesity 4. Atrial arrhythmias: Paroxysmal atrial fibrillation seems to have been post-operative after  CAD-AVR. She has a history of amiodarone toxicity. She has not been on coumadin. 3-week event monitor (9/13) with no atrial fibrillation. 3-week event monitor (10/14) with runs of probable atrial tachycardia. 30 day monitor in 8/16 with atrial tachycardia (multiple episodes).  5. CAD: CABG 1998 with LIMA-LAD/diagonal, sequential SVG-OM1/OM2, SVG-RCA. Redo CABG 2009 with SVG-OM3, SVG-PDA. Lexiscan myoview (9/13): EF 61%, no ischemia or infarction. LHC (10/14) with all grafts patent except the old SVG-RCA. There was 90% stenosis in the AV LCx before ungrafted MOM, but this was unchanged from prior study. Lexiscan Cardiolite (1/15) with no ischemia. She does not tolerate Imdur.  6. CKD 7. Bioprosthetic aortic valve: Echo (2011) with EF 50-55%, bioprosthetic aortic valve well-seated. Echo (2/13) with EF 60-65%, bioprosthetic aortic valve looked ok. Echo (9/14): EF 50-55%, mild LVH, bioprosthetic AVR with mean gradient 10, mild MR, moderate TR.  8. Asthma: since her teens. 9. Low back pain.  10. 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 Per neurologist, did not appear cardioembolic.  11. Carotid stenosis: 40-59% bilateral ICA stenosis on 10/14 carotid dopplers. Carotid doppler (11/15) with 40-59% bilateral ICA stenosis. Carotid dopplers (11/16) with 40-59% BICA stenosis.  12. Sleep study 12/14 without significant OSA.  13. Chronic diastolic CHF    Past Medical History:  Diagnosis Date  . Abnormally small mouth   . Anemia   . Bilateral carotid artery stenosis    Bilateral ICA 40-59%/  >50% LECA  . Breast cancer ([place])   . Breast cancer of upper-inner quadrant of right female breast ([place]) 12/16/2015  . Bruises easily   . CAD (coronary artery disease) cardiologist-  Hannah Hannah [name]   Remote CABG in 1998 with redo in 2009  . CHF (congestive heart failure) (H[facility])    EF is low normal at 50 to 55% per echo in Jan. 2011  . Dyspnea   . GERD (gastroesophageal reflux disease)   . Hearing loss of  right ear   . History of non-ST elevation myocardial infarction (NSTEMI)    Sept 2014--  thought to be type II HTN w/ LHC without infarct related artery and patent grafts  . Hyperlipidemia   . Hypertension   . Iron deficiency anemia   . Moderate persistent asthma    pulmologist-  Hannah. Malvin Johns  . Nausea & vomiting    related to stone  . Nonischemic cardiomyopathy ([place])   . PAF (paroxysmal atrial fibrillation) ([place])   . PONV (postoperative nausea and vomiting)    pt states patch worked well before  . Psoriasis    right leg  . Renal calculus, right   . Renal insufficiency, mild   . S/P AVR    prosthesis valve placement 2009 at same time re-do CABG  . Stroke [facility])    residual rt hearing loss  . Type 2 diabetes mellitus ([place])    monitored by Hannah Dwyane Dee    Past Surgical History:  Procedure Laterality Date  . AORTIC VALVE REPLACEMENT  2009   #33mm  [facility] Ease pericardial valve (done same time is CABG)  . BREAST LUMPECTOMY WITH RADIOACTIVE SEED AND SENTINEL LYMPH NODE BIOPSY Right 01/18/2016   Procedure: RIGHT BREAST LUMPECTOMY WITH RADIOACTIVE SEED AND SENTINEL LYMPH NODE BIOPSY;  Surgeon: Alphonsa Overall, MD;  Location: [place];  Service: General;  Laterality: Right;  . CARDIAC CATHETERIZATION  03/23/2008   Pre-redo CABG: L main OK, LAD (T), CFX (T), OM1 99%, RCA (T), LIMA-LAD OK, SVG-OM(?3) OK w/ little florw to OM2, SVG-RCA OK. EF NL  . CARDIOVASCULAR STRESS TEST  06-15-2013      Hannah Debara Pickett   [facility] perfusion study/  no evidence ischemia/  normal wall motion, ef 72%  . CARPAL TUNNEL RELEASE    . COLONOSCOPY    . CORONARY ARTERY BYPASS GRAFT  1998 &  re-do 2009   Had LIMA to DX/LAD, SVG to 2 marginal branches and SVG to [facility] originally; SVG to 3rd OM and PD at time of redo  . CYSTOSCOPY W/ URETERAL STENT PLACEMENT Right 12/20/2014   Procedure: CYSTOSCOPY WITH RETROGRADE PYELOGRAM/URETERAL STENT PLACEMENT;  Surgeon: Cleon Gustin, MD;  [facility];  Service:  Urology;  Laterality: Right;  . EYE SURGERY Bilateral    cataracts  . HOLMIUM LASER APPLICATION Right 0/93/2355   Procedure:  HOLMIUM LASER LITHOTRIPSY;  Surgeon: Cleon Gustin, MD;  [facility];  Service: Urology;  Laterality: Right;  . LEFT HEART CATHETERIZATION WITH CORONARY/GRAFT ANGIOGRAM N/A 02/23/2013   Procedure: LEFT HEART CATHETERIZATION WITH Beatrix Fetters;  Surgeon: Blane Ohara, MD;  Location: [facility] CATH LAB;  Service: Cardiovascular;  Laterality: N/A;  . PORTACATH PLACEMENT Left 01/18/2016   Procedure: INSERTION PORT-A-CATH;  Surgeon: Alphonsa Overall, MD;  Location: [place];  Service: General;  Laterality: Left;  . TONSILLECTOMY    . TRANSTHORACIC ECHOCARDIOGRAM  02-24-2013      mild LVH,  ef 50-55%/  AV bioprosthesis was present with very mild stenosis and no regurg., mean grandient 7mmHg, peak grandient 55mmHg /  mild MR/  mild LAE and RAE/  moderate TR  . TUBAL LIGATION       Medications: Current Outpatient Prescriptions  Medication Sig Dispense Refill  . ACCU-CHEK AVIVA PLUS test strip     . alendronate (FOSAMAX) 70 MG tablet Take 70 mg by mouth once a week. Take with a full glass of water on an empty stomach.    Marland Kitchen aspirin EC 81 MG EC tablet Take 1 tablet (81 mg total) by mouth daily.    . budesonide-formoterol (SYMBICORT) 160-4.5 MCG/ACT inhaler INHALE TWO PUFFS INTO LUNGS TWICE DAILY 1 Inhaler 11  . clopidogrel (PLAVIX) 75 MG tablet Take 75 mg by mouth daily.    . furosemide (LASIX) 80 MG tablet Take 1 tablet (80 mg total) by mouth daily. 1 tablet in the AM and 1/2 tablet (40 mg) in the early afternoon. 90 tablet 3  . gabapentin (NEURONTIN) 300 MG capsule Take 1 capsule (300 mg total) by mouth 3 (three) times daily. 90 capsule 3  . insulin lispro (HUMALOG KWIKPEN) 100 UNIT/ML KiwkPen INJECT 10 UNITS INTO THE SKIN WITH MEALS PLUS SLIDING SCALE 15 mL 2  . insulin lispro (HUMALOG) 100 UNIT/ML injection Inject 76 units daily with  V-GO pump  70 mL 2  . lidocaine-prilocaine (EMLA) cream Apply to affected area once 30 g 3  . loratadine (CLARITIN) 10 MG tablet Take 10 mg by mouth daily.    . metoprolol succinate (TOPROL-XL) 100 MG 24 hr  tablet Take 1 tablet (100 mg total) by mouth 2 (two) times daily. Take with or immediately following a meal. 60 tablet 10  . omeprazole (PRILOSEC OTC) 20 MG tablet Take 20 mg by mouth daily as needed (heartburn).     . potassium chloride SA (KLOR-CON M20) 20 MEQ tablet Take 1 tablet (20 mEq total) by mouth 2 (two) times daily. 60 tablet 3  . rosuvastatin (CRESTOR) 40 MG tablet TAKE ONE TABLET BY MOUTH ONCE DAILY 30 tablet 11  . TraMADol HCl 200 MG CP24 Take 200 mg by mouth as needed (for back pain).     . TRULICITY 9.83 JA/2.5KN SOPN Inject 0.75 mg into the skin every Wednesday.     Marland Kitchen ZETIA 10 MG tablet Take 1 tablet by mouth  daily 90 tablet 2   No current facility-administered medications for this visit.     Allergies: Allergies  Allergen Reactions  . Amoxicillin Rash    Has patient had a PCN reaction causing immediate rash, facial/tongue/throat swelling, SOB or lightheadedness with hypotension: No Has patient had a PCN reaction causing severe rash involving mucus membranes or skin necrosis: Yes Has patient had a PCN reaction that required hospitalization No Has patient had a PCN reaction occurring within the last 10 years: No If all of the above answers are "NO", then may proceed with Cephalosporin use.   . Imdur [Isosorbide Dinitrate] Other (See Comments)    Headache/severe hypotension/Syncope  . Latex Itching  . Tetracycline Rash    Social History: The patient  reports that she has never smoked. She has never used smokeless tobacco. She reports that she does not drink alcohol or use drugs.   Family History: The patient's family history includes Diabetes in her maternal grandmother and son; Heart disease in her father and mother; Heart failure in her father.   Review of  Systems: Please see the history of present illness.   Otherwise, the review of systems is positive for none.   All other systems are reviewed and negative.   Physical Exam: VS:  BP (!) 150/60   Pulse 77   Ht 5\' 5"  (1.651 m)   Wt 205 lb (93 kg)   SpO2 93% Comment: at rest  BMI 34.11 kg/m  .  BMI Body mass index is 34.11 kg/m.  Wt Readings from Last 3 Encounters:  06/15/16 205 lb (93 kg)  06/12/16 207 lb 9.6 oz (94.2 kg)  06/05/16 205 lb (93 kg)    General: Pleasant. Chronically ill appearing but looks stronger. She is alert and in no acute distress.   HEENT: Normal.  Neck: Supple, no JVD, carotid bruits, or masses noted.  Cardiac: Regular rate and rhythm. No murmurs, rubs, or gallops. No edema.  Respiratory:  Lungs are clear to auscultation bilaterally with normal work of breathing.  GI: Soft and nontender.  MS: No deformity or atrophy. Gait and ROM intact.  Skin: Warm and dry. Color is normal.  Neuro:  Strength and sensation are intact and no gross focal deficits noted.  Psych: Alert, appropriate and with normal affect.   LABORATORY DATA:  EKG:  EKG is not ordered today.  Lab Results  Component Value Date   WBC 7.3 05/23/2016   HGB 10.3 (L) 05/23/2016   HCT 32.7 (L) 05/23/2016   PLT 170 05/23/2016   GLUCOSE 93 05/23/2016   CHOL 116 (L) 07/05/2015   TRIG 99 07/05/2015   HDL 38 (L) 07/05/2015   LDLDIRECT 109.2 03/01/2014   LDLCALC 58 07/05/2015  ALT 24 04/27/2016   AST 28 04/27/2016   NA 141 05/23/2016   K 4.1 05/23/2016   CL 98 05/23/2016   CREATININE 1.66 (H) 05/23/2016   BUN 37 (H) 05/23/2016   CO2 36 (H) 05/23/2016   TSH 3.355 04/08/2016   INR 1.06 06/15/2013   HGBA1C 6.4 05/08/2016   MICROALBUR 24.3 (H) 09/19/2015    BNP (last 3 results)  Recent Labs  05/08/16 1144 05/23/16 1143  BNP 209.4* 243.3*    ProBNP (last 3 results) No results for input(s): PROBNP in the last 8760 hours.   Other Studies Reviewed Today:  Echo Study Conclusions  from 03/2016  - Left ventricle: The cavity size was normal. There was moderate focal basal and mild concentric hypertrophy. Systolic function was normal. The estimated ejection fraction was in the range of 60% to 65%. Images were inadequate for LV wall motion assessment. Features are consistent with a pseudonormal left ventricular filling pattern, with concomitant abnormal relaxation and increased filling pressure (grade 2 diastolic dysfunction). Doppler parameters are consistent with high ventricular filling pressure. - Aortic valve: A bioprosthesis was present and functioning normally. Mean gradient (S): 17 mm Hg. - Mitral valve: Moderately calcified annulus. - Pulmonary arteries: PA peak pressure: 38 mm Hg (S).  Impressions:  - The right ventricular systolic pressure was increased consistent with mild pulmonary hypertension.   MYOVIEW FINDINGS FROM 06/2013: Normal resting EKG. Slight ST depressions noted in aVL after administration of lexiscan. Mild shortness of breath and nausea resolved after the test. EKG is nondiagnostic for ischemia. TID ratio 1.05. Lung-heart ratio 0.43. Normal ventricular chamber size.  IMPRESSION: No evidence for ischemia. Normal wall motion. LVEF 72%.   Electronically Signed By: Guy Sandifer On: 06/15/2013 14:27    Assessment/Plan:  1. Acute on chronic diastolic HF - - she continues to slowly improve with her current diuretic therapy but still up from 198# when I saw her back in October. She needs labs today. If she has worsening renal function will be forced to cut back her Lasix. Continued salt restriction.   2. Anemia - worse from the chemo but she has had this in the past and was on Aranesp in the past - ?if needs to be reconsidered - will defer to oncology. Rechecking today.   3. CAD  Admission in 9/14 with mild troponin elevation. LHC showed stable anatomy with patent grafts except for SVG-RCA from  older CABG. Suspect that the troponin elevation was due to demand ischemia in the setting of hypertensive emergency and tachycardia (possible run of atrial tachycardia). Admission in 1/15 with atypical chest pain, had Lexiscan Cardiolite that showed no ischemia. No recent chest pain.  - Continue ASA 81, Plavix, statin, Toprol XL.   4. Atrial Arrhythmias Patient has not been on anticoagulation. She apparently had atrial fibrillation at some point in the remote past. It seems that the atrial fibrillation may have been post-op after redo CABG and has not recurred. She has been noted to have atrial tachycardia but no atrial fibrillation or flutter. No real complaint of palpitations today but having so many other issues. Heart rate improved today with just resting - clearly in NSR again during my visit today.  5. Bioprosthetic aortic valve Most recent echo from October noted  6. Carotid bruit She is to have repeat dopplers later in the fall - has a lot going on with her breast cancer - will arrange after her radiation is complete.    7. CKD  8. Breast cancer-  no more chemoand almost half way thru her radiation.  Current medicines are reviewed with the patient today.  The patient does not have concerns regarding medicines other than what has been noted above.  The following changes have been made:  See above.  Labs/ tests ordered today include:    Orders Placed This Encounter  Procedures  . Basic metabolic panel  . CBC     Disposition:   FU with me in 5 weeks.   Patient is agreeable to this plan and will call if any problems develop in the interim.   Signed: [name], RN, ANP-C 06/15/2016 11:37 AM  [facility] [address] [place], [place]  [address] Phone: [phone] Fax: [phone]

## 2016-06-16 LAB — BASIC METABOLIC PANEL
BUN/Creatinine Ratio: 27 (ref 12–28)
BUN: 37 mg/dL — ABNORMAL HIGH (ref 8–27)
CO2: 29 mmol/L (ref 18–29)
Calcium: 9.4 mg/dL (ref 8.7–10.3)
Chloride: 100 mmol/L (ref 96–106)
Creatinine, Ser: 1.39 mg/dL — ABNORMAL HIGH (ref 0.57–1.00)
GFR calc Af Amer: 44 mL/min/{1.73_m2} — ABNORMAL LOW (ref >59)
GFR calc non Af Amer: 38 mL/min/{1.73_m2} — ABNORMAL LOW (ref >59)
Glucose: 104 mg/dL — ABNORMAL HIGH (ref 65–99)
Potassium: 4.6 mmol/L (ref 3.5–5.2)
Sodium: 143 mmol/L (ref 134–144)

## 2016-06-16 LAB — CBC
Hematocrit: 31.3 % — ABNORMAL LOW (ref 34.0–46.6)
Hemoglobin: 10 g/dL — ABNORMAL LOW (ref 11.1–15.9)
MCH: 29.7 pg (ref 26.6–33.0)
MCHC: 31.9 g/dL (ref 31.5–35.7)
MCV: 93 fL (ref 79–97)
Platelets: 157 10*3/uL (ref 150–379)
RBC: 3.37 x10E6/uL — ABNORMAL LOW (ref 3.77–5.28)
RDW: 13.9 % (ref 12.3–15.4)
WBC: 5.6 10*3/uL (ref 3.4–10.8)

## 2016-06-18 ENCOUNTER — Ambulatory Visit
Admission: RE | Admit: 2016-06-18 | Discharge: 2016-06-18 | Disposition: A | Discharge status: Home / Self Care | Payer: Medicare Other | Source: Ambulatory Visit | Attending: Radiation Oncology | Admitting: Radiation Oncology

## 2016-06-18 ENCOUNTER — Encounter: Payer: Self-pay | Admitting: Radiation Oncology

## 2016-06-18 VITALS — BP 152/51 | HR 69 | Temp 97.7°F | Ht 65.0 in | Wt 204.2 lb

## 2016-06-18 DIAGNOSIS — C50211 Malignant neoplasm of upper-inner quadrant of right female breast: Secondary | ICD-10-CM | POA: Insufficient documentation

## 2016-06-18 DIAGNOSIS — Z510 Encounter for antineoplastic radiation therapy: Secondary | ICD-10-CM | POA: Diagnosis not present

## 2016-06-18 DIAGNOSIS — Z170 Estrogen receptor positive status [ER+]: Principal | ICD-10-CM

## 2016-06-18 NOTE — Progress Notes (Signed)
  Radiation Oncology         [phone] ________________________________  Name: Hannah [name] MRN: [id number]  Date: 06/18/2016  DOB: Jun 23, 1945   Weekly Radiation Therapy Management    ICD-9-CM ICD-10-CM   1. Malignant neoplasm of upper-inner quadrant of right breast in female, estrogen receptor positive ([place]) 174.2 C50.211    V86.0 Z17.0      Current Dose: 28.8 Gy     Planned Dose:  60.4 Gy  Narrative . . . . . . . . The patient presents for routine under treatment assessment.                                   Weekly rad txs right breast, 16/33 completed. Patient reports soreness under her right arm. She also reports fatigue. Per nursing, the skin on her right breast is pink. She is using Radiaplex as directed. She also had a firm area under her right arm that presented on Friday.                                  Set-up films were reviewed.                                 The chart was checked. Physical Findings. . .  height is 5\' 5"  (1.651 m) and weight is 204 lb 3.2 oz (92.6 kg). Her oral temperature is 97.7 F (36.5 C). Her blood pressure is 152/51 (abnormal) and her pulse is 69. Her oxygen saturation is 100%. . Weight essentially stable. Lungs are clear to auscultation bilaterally. Heart has regular rate and rhythm. No palpable cervical, supraclavicular, or axillary adenopathy. Abdomen soft, non-tender, normal bowel sounds. Seroma in right axillary area is more prominent but there are no signs of infection. Erythema noted in the right breast. Impression . . . . . . . The patient is tolerating radiation. Plan . . . . . . . . . . . . Continue treatment as planned. Patient was given hydrogel pads to alleviate soreness.  ________________________________   Blair Promise, PhD, MD  This document serves as a record of services personally performed by Gery Pray, MD. It was created on his behalf by Bethann Humble, a trained medical scribe. The creation of this record is based on the  scribe's personal observations and the provider's statements to them. This document has been checked and approved by the attending provider.

## 2016-06-18 NOTE — Progress Notes (Addendum)
Hannah [name] has completed 16 fractions to her right breast.  She reports having some soreness under her right arm.  She reports having fatigue.  She is using radiaplex.  The skin on her right breast is pink.  She has a firm area under her right arm that presented on Friday.  She has been given hydrogel pads to try for the soreness.  BP (!) 152/51 (BP Location: Left Arm, Patient Position: Sitting)   Pulse 69   Temp 97.7 F (36.5 C) (Oral)   Ht 5\' 5"  (1.651 m)   Wt 204 lb 3.2 oz (92.6 kg)   SpO2 100%   BMI 33.98 kg/m    Wt Readings from Last 3 Encounters:  06/18/16 204 lb 3.2 oz (92.6 kg)  06/15/16 205 lb (93 kg)  06/12/16 207 lb 9.6 oz (94.2 kg)

## 2016-06-19 ENCOUNTER — Ambulatory Visit
Admission: RE | Admit: 2016-06-19 | Discharge: 2016-06-19 | Disposition: A | Discharge status: Home / Self Care | Payer: Medicare Other | Source: Ambulatory Visit | Attending: Radiation Oncology | Admitting: Radiation Oncology

## 2016-06-19 ENCOUNTER — Ambulatory Visit: Payer: Medicare Other | Admitting: Radiation Oncology

## 2016-06-19 DIAGNOSIS — Z510 Encounter for antineoplastic radiation therapy: Secondary | ICD-10-CM | POA: Diagnosis not present

## 2016-06-20 ENCOUNTER — Ambulatory Visit
Admission: RE | Admit: 2016-06-20 | Discharge: 2016-06-20 | Disposition: A | Discharge status: Home / Self Care | Payer: Medicare Other | Source: Ambulatory Visit | Attending: Radiation Oncology | Admitting: Radiation Oncology

## 2016-06-20 DIAGNOSIS — Z510 Encounter for antineoplastic radiation therapy: Secondary | ICD-10-CM | POA: Diagnosis not present

## 2016-06-21 ENCOUNTER — Telehealth: Payer: Self-pay | Admitting: *Deleted

## 2016-06-21 ENCOUNTER — Ambulatory Visit
Admission: RE | Admit: 2016-06-21 | Discharge: 2016-06-21 | Disposition: A | Discharge status: Home / Self Care | Payer: Medicare Other | Source: Ambulatory Visit | Attending: Radiation Oncology | Admitting: Radiation Oncology

## 2016-06-21 ENCOUNTER — Other Ambulatory Visit: Payer: Self-pay | Admitting: Endocrinology

## 2016-06-21 DIAGNOSIS — Z510 Encounter for antineoplastic radiation therapy: Secondary | ICD-10-CM | POA: Diagnosis not present

## 2016-06-21 NOTE — Telephone Encounter (Signed)
FYI "I have congestion, cough with clear stuff coming up.  No fever.  I've finished chemotherapy and fifteen days into radiation.  Who do I call?"   Will notify provider.  Can call PCP and should notify RT.  Treat symptoms and drink lots of water to decrease congestion.

## 2016-06-22 ENCOUNTER — Ambulatory Visit: Payer: Medicare Other

## 2016-06-22 ENCOUNTER — Observation Stay (HOSPITAL_COMMUNITY)
Admission: EM | Admit: 2016-06-22 | Discharge: 2016-06-24 | Disposition: A | Discharge status: Home / Self Care | Payer: Medicare Other | Attending: Family Medicine | Admitting: Family Medicine

## 2016-06-22 ENCOUNTER — Emergency Department (HOSPITAL_COMMUNITY): Payer: Medicare Other

## 2016-06-22 ENCOUNTER — Encounter (HOSPITAL_COMMUNITY): Payer: Self-pay | Admitting: Emergency Medicine

## 2016-06-22 DIAGNOSIS — G620 Drug-induced polyneuropathy: Secondary | ICD-10-CM | POA: Diagnosis not present

## 2016-06-22 DIAGNOSIS — E785 Hyperlipidemia, unspecified: Secondary | ICD-10-CM | POA: Insufficient documentation

## 2016-06-22 DIAGNOSIS — Z8673 Personal history of transient ischemic attack (TIA), and cerebral infarction without residual deficits: Secondary | ICD-10-CM | POA: Insufficient documentation

## 2016-06-22 DIAGNOSIS — Z953 Presence of xenogenic heart valve: Secondary | ICD-10-CM | POA: Diagnosis not present

## 2016-06-22 DIAGNOSIS — Z881 Allergy status to other antibiotic agents status: Secondary | ICD-10-CM | POA: Insufficient documentation

## 2016-06-22 DIAGNOSIS — E1169 Type 2 diabetes mellitus with other specified complication: Secondary | ICD-10-CM | POA: Diagnosis not present

## 2016-06-22 DIAGNOSIS — Z7902 Long term (current) use of antithrombotics/antiplatelets: Secondary | ICD-10-CM | POA: Diagnosis not present

## 2016-06-22 DIAGNOSIS — I509 Heart failure, unspecified: Secondary | ICD-10-CM

## 2016-06-22 DIAGNOSIS — Z880 Allergy status to penicillin: Secondary | ICD-10-CM | POA: Insufficient documentation

## 2016-06-22 DIAGNOSIS — Z794 Long term (current) use of insulin: Secondary | ICD-10-CM

## 2016-06-22 DIAGNOSIS — I10 Essential (primary) hypertension: Secondary | ICD-10-CM | POA: Diagnosis not present

## 2016-06-22 DIAGNOSIS — R0902 Hypoxemia: Secondary | ICD-10-CM | POA: Diagnosis not present

## 2016-06-22 DIAGNOSIS — J209 Acute bronchitis, unspecified: Secondary | ICD-10-CM | POA: Diagnosis not present

## 2016-06-22 DIAGNOSIS — E1122 Type 2 diabetes mellitus with diabetic chronic kidney disease: Secondary | ICD-10-CM

## 2016-06-22 DIAGNOSIS — R05 Cough: Secondary | ICD-10-CM | POA: Diagnosis not present

## 2016-06-22 DIAGNOSIS — I130 Hypertensive heart and chronic kidney disease with heart failure and stage 1 through stage 4 chronic kidney disease, or unspecified chronic kidney disease: Secondary | ICD-10-CM | POA: Insufficient documentation

## 2016-06-22 DIAGNOSIS — C50211 Malignant neoplasm of upper-inner quadrant of right female breast: Secondary | ICD-10-CM | POA: Diagnosis not present

## 2016-06-22 DIAGNOSIS — Z79899 Other long term (current) drug therapy: Secondary | ICD-10-CM | POA: Insufficient documentation

## 2016-06-22 DIAGNOSIS — J09X2 Influenza due to identified novel influenza A virus with other respiratory manifestations: Secondary | ICD-10-CM | POA: Diagnosis not present

## 2016-06-22 DIAGNOSIS — I5042 Chronic combined systolic (congestive) and diastolic (congestive) heart failure: Secondary | ICD-10-CM | POA: Insufficient documentation

## 2016-06-22 DIAGNOSIS — N183 Chronic kidney disease, stage 3 (moderate): Secondary | ICD-10-CM | POA: Diagnosis not present

## 2016-06-22 DIAGNOSIS — Z7982 Long term (current) use of aspirin: Secondary | ICD-10-CM | POA: Diagnosis not present

## 2016-06-22 DIAGNOSIS — J45909 Unspecified asthma, uncomplicated: Secondary | ICD-10-CM | POA: Diagnosis not present

## 2016-06-22 DIAGNOSIS — I480 Paroxysmal atrial fibrillation: Secondary | ICD-10-CM | POA: Insufficient documentation

## 2016-06-22 DIAGNOSIS — Z951 Presence of aortocoronary bypass graft: Secondary | ICD-10-CM | POA: Insufficient documentation

## 2016-06-22 DIAGNOSIS — T451X5A Adverse effect of antineoplastic and immunosuppressive drugs, initial encounter: Secondary | ICD-10-CM | POA: Insufficient documentation

## 2016-06-22 DIAGNOSIS — Z95828 Presence of other vascular implants and grafts: Secondary | ICD-10-CM

## 2016-06-22 DIAGNOSIS — I252 Old myocardial infarction: Secondary | ICD-10-CM | POA: Diagnosis not present

## 2016-06-22 DIAGNOSIS — R059 Cough, unspecified: Secondary | ICD-10-CM | POA: Diagnosis present

## 2016-06-22 DIAGNOSIS — I2510 Atherosclerotic heart disease of native coronary artery without angina pectoris: Secondary | ICD-10-CM | POA: Diagnosis present

## 2016-06-22 DIAGNOSIS — E860 Dehydration: Secondary | ICD-10-CM

## 2016-06-22 DIAGNOSIS — E119 Type 2 diabetes mellitus without complications: Secondary | ICD-10-CM

## 2016-06-22 DIAGNOSIS — Z9641 Presence of insulin pump (external) (internal): Secondary | ICD-10-CM | POA: Insufficient documentation

## 2016-06-22 DIAGNOSIS — Z8679 Personal history of other diseases of the circulatory system: Secondary | ICD-10-CM | POA: Diagnosis present

## 2016-06-22 DIAGNOSIS — K219 Gastro-esophageal reflux disease without esophagitis: Secondary | ICD-10-CM | POA: Diagnosis not present

## 2016-06-22 DIAGNOSIS — R55 Syncope and collapse: Secondary | ICD-10-CM

## 2016-06-22 DIAGNOSIS — R0602 Shortness of breath: Secondary | ICD-10-CM | POA: Diagnosis present

## 2016-06-22 DIAGNOSIS — Z952 Presence of prosthetic heart valve: Secondary | ICD-10-CM

## 2016-06-22 DIAGNOSIS — N289 Disorder of kidney and ureter, unspecified: Secondary | ICD-10-CM

## 2016-06-22 DIAGNOSIS — R6889 Other general symptoms and signs: Secondary | ICD-10-CM

## 2016-06-22 DIAGNOSIS — J101 Influenza due to other identified influenza virus with other respiratory manifestations: Secondary | ICD-10-CM

## 2016-06-22 LAB — INFLUENZA PANEL BY PCR (TYPE A & B)
Influenza A By PCR: POSITIVE — AB
Influenza B By PCR: NEGATIVE

## 2016-06-22 LAB — URINALYSIS, ROUTINE W REFLEX MICROSCOPIC
Bilirubin Urine: NEGATIVE
Glucose, UA: 50 mg/dL — AB
Ketones, ur: NEGATIVE mg/dL
Leukocytes, UA: NEGATIVE
Nitrite: NEGATIVE
Protein, ur: 30 mg/dL — AB
Specific Gravity, Urine: 1.012 (ref 1.005–1.030)
pH: 6 (ref 5.0–8.0)

## 2016-06-22 LAB — BASIC METABOLIC PANEL
Anion gap: 10 (ref 5–15)
BUN: 33 mg/dL — ABNORMAL HIGH (ref 6–20)
CO2: 31 mmol/L (ref 22–32)
Calcium: 9 mg/dL (ref 8.9–10.3)
Chloride: 94 mmol/L — ABNORMAL LOW (ref 101–111)
Creatinine, Ser: 1.7 mg/dL — ABNORMAL HIGH (ref 0.44–1.00)
GFR calc Af Amer: 34 mL/min — ABNORMAL LOW (ref >60)
GFR calc non Af Amer: 29 mL/min — ABNORMAL LOW (ref >60)
Glucose, Bld: 221 mg/dL — ABNORMAL HIGH (ref 65–99)
Potassium: 3.9 mmol/L (ref 3.5–5.1)
Sodium: 135 mmol/L (ref 135–145)

## 2016-06-22 LAB — I-STAT CG4 LACTIC ACID, ED: Lactic Acid, Venous: 1.38 mmol/L (ref 0.5–1.9)

## 2016-06-22 LAB — TROPONIN I: Troponin I: <0.03 ng/mL (ref <0.03)

## 2016-06-22 LAB — CBC WITH DIFFERENTIAL/PLATELET
Basophils Absolute: 0 10*3/uL (ref 0.0–0.1)
Basophils Relative: 1 %
Eosinophils Absolute: 0.2 10*3/uL (ref 0.0–0.7)
Eosinophils Relative: 3 %
HCT: 32.8 % — ABNORMAL LOW (ref 36.0–46.0)
Hemoglobin: 10.5 g/dL — ABNORMAL LOW (ref 12.0–15.0)
Lymphocytes Relative: 6 %
Lymphs Abs: 0.4 10*3/uL — ABNORMAL LOW (ref 0.7–4.0)
MCH: 30.1 pg (ref 26.0–34.0)
MCHC: 32 g/dL (ref 30.0–36.0)
MCV: 94 fL (ref 78.0–100.0)
Monocytes Absolute: 0.5 10*3/uL (ref 0.1–1.0)
Monocytes Relative: 7 %
Neutro Abs: 5.1 10*3/uL (ref 1.7–7.7)
Neutrophils Relative %: 83 %
Platelets: 126 10*3/uL — ABNORMAL LOW (ref 150–400)
RBC: 3.49 MIL/uL — ABNORMAL LOW (ref 3.87–5.11)
RDW: 13.9 % (ref 11.5–15.5)
WBC: 6.2 10*3/uL (ref 4.0–10.5)

## 2016-06-22 LAB — CBG MONITORING, ED: Glucose-Capillary: 211 mg/dL — ABNORMAL HIGH (ref 65–99)

## 2016-06-22 LAB — GLUCOSE, CAPILLARY: Glucose-Capillary: 186 mg/dL — ABNORMAL HIGH (ref 65–99)

## 2016-06-22 LAB — BRAIN NATRIURETIC PEPTIDE: B Natriuretic Peptide: 257 pg/mL — ABNORMAL HIGH (ref 0.0–100.0)

## 2016-06-22 LAB — I-STAT TROPONIN, ED: Troponin i, poc: 0.02 ng/mL (ref 0.00–0.08)

## 2016-06-22 MED ORDER — METOPROLOL SUCCINATE ER 100 MG PO TB24
100.0000 mg | ORAL_TABLET | Freq: Two times a day (BID) | ORAL | Status: DC
  Administered 2016-06-22 – 2016-06-24 (×4): 100 mg via ORAL
  Filled 2016-06-22 (×5): qty 1

## 2016-06-22 MED ORDER — TRAMADOL HCL 50 MG PO TABS: 100.0000 mg | ORAL_TABLET | Freq: Two times a day (BID) | ORAL | Status: DC | PRN

## 2016-06-22 MED ORDER — INSULIN ASPART 100 UNIT/ML ~~LOC~~ SOLN: 0.0000 [IU] | Freq: Every day | SUBCUTANEOUS | Status: DC

## 2016-06-22 MED ORDER — IPRATROPIUM-ALBUTEROL 0.5-2.5 (3) MG/3ML IN SOLN
3.0000 mL | Freq: Four times a day (QID) | RESPIRATORY_TRACT | Status: DC
  Administered 2016-06-23 – 2016-06-24 (×5): 3 mL via RESPIRATORY_TRACT
  Filled 2016-06-22 (×5): qty 3

## 2016-06-22 MED ORDER — MAGNESIUM SULFATE 2 GM/50ML IV SOLN
2.0000 g | Freq: Once | INTRAVENOUS | Status: AC
Start: 2016-06-22 — End: 2016-06-23
  Administered 2016-06-22: 2 g via INTRAVENOUS
  Filled 2016-06-22: qty 50

## 2016-06-22 MED ORDER — ASPIRIN EC 81 MG PO TBEC
81.0000 mg | DELAYED_RELEASE_TABLET | Freq: Every day | ORAL | Status: DC
  Administered 2016-06-23 – 2016-06-24 (×2): 81 mg via ORAL
  Filled 2016-06-22 (×4): qty 1

## 2016-06-22 MED ORDER — IPRATROPIUM-ALBUTEROL 0.5-2.5 (3) MG/3ML IN SOLN
3.0000 mL | Freq: Once | RESPIRATORY_TRACT | Status: AC
  Administered 2016-06-22: 3 mL via RESPIRATORY_TRACT
  Filled 2016-06-22: qty 3

## 2016-06-22 MED ORDER — ONDANSETRON HCL 4 MG PO TABS: 4.0000 mg | ORAL_TABLET | Freq: Four times a day (QID) | ORAL | Status: DC | PRN

## 2016-06-22 MED ORDER — SODIUM CHLORIDE 0.9 % IV BOLUS (SEPSIS)
1000.0000 mL | Freq: Once | INTRAVENOUS | Status: AC
  Administered 2016-06-22: 1000 mL via INTRAVENOUS

## 2016-06-22 MED ORDER — EZETIMIBE 10 MG PO TABS
10.0000 mg | ORAL_TABLET | Freq: Every day | ORAL | Status: DC
  Administered 2016-06-23 – 2016-06-24 (×2): 10 mg via ORAL
  Filled 2016-06-22 (×2): qty 1

## 2016-06-22 MED ORDER — ONDANSETRON HCL 4 MG/2ML IJ SOLN: 4.0000 mg | Freq: Four times a day (QID) | INTRAMUSCULAR | Status: DC | PRN

## 2016-06-22 MED ORDER — TECHNETIUM TC 99M DIETHYLENETRIAME-PENTAACETIC ACID: 31.0000 | Freq: Once | INTRAVENOUS | Status: DC | PRN

## 2016-06-22 MED ORDER — LORATADINE 10 MG PO TABS
10.0000 mg | ORAL_TABLET | Freq: Every day | ORAL | Status: DC
  Administered 2016-06-23 – 2016-06-24 (×2): 10 mg via ORAL
  Filled 2016-06-22 (×2): qty 1

## 2016-06-22 MED ORDER — PANTOPRAZOLE SODIUM 40 MG PO TBEC: 40.0000 mg | DELAYED_RELEASE_TABLET | Freq: Every day | ORAL | Status: DC | PRN

## 2016-06-22 MED ORDER — ACETAMINOPHEN 325 MG PO TABS
650.0000 mg | ORAL_TABLET | Freq: Four times a day (QID) | ORAL | Status: DC | PRN
  Administered 2016-06-22: 650 mg via ORAL
  Filled 2016-06-22: qty 2

## 2016-06-22 MED ORDER — SODIUM CHLORIDE 0.9 % IV SOLN: Freq: Once | INTRAVENOUS | Status: DC

## 2016-06-22 MED ORDER — OMEPRAZOLE MAGNESIUM 20 MG PO TBEC: 20.0000 mg | DELAYED_RELEASE_TABLET | Freq: Every day | ORAL | Status: DC | PRN

## 2016-06-22 MED ORDER — INSULIN PUMP
Freq: Three times a day (TID) | SUBCUTANEOUS | Status: DC
  Administered 2016-06-23: 12:00:00 via SUBCUTANEOUS
  Administered 2016-06-23: 4 via SUBCUTANEOUS
  Administered 2016-06-23: 17:00:00 via SUBCUTANEOUS
  Administered 2016-06-23: 1 via SUBCUTANEOUS
  Filled 2016-06-22: qty 1

## 2016-06-22 MED ORDER — INSULIN ASPART 100 UNIT/ML ~~LOC~~ SOLN
0.0000 [IU] | Freq: Three times a day (TID) | SUBCUTANEOUS | Status: DC
  Administered 2016-06-23: 7 [IU] via SUBCUTANEOUS
  Administered 2016-06-23 – 2016-06-24 (×3): 11 [IU] via SUBCUTANEOUS

## 2016-06-22 MED ORDER — OSELTAMIVIR PHOSPHATE 30 MG PO CAPS
30.0000 mg | ORAL_CAPSULE | Freq: Two times a day (BID) | ORAL | Status: DC
  Administered 2016-06-22 – 2016-06-24 (×4): 30 mg via ORAL
  Filled 2016-06-22 (×4): qty 1

## 2016-06-22 MED ORDER — AZITHROMYCIN 250 MG PO TABS
500.0000 mg | ORAL_TABLET | ORAL | Status: DC
  Administered 2016-06-22: 500 mg via ORAL
  Filled 2016-06-22: qty 2

## 2016-06-22 MED ORDER — TECHNETIUM TO 99M ALBUMIN AGGREGATED
4.1000 | Freq: Once | INTRAVENOUS | Status: AC | PRN
  Administered 2016-06-22: 4.1 via INTRAVENOUS

## 2016-06-22 MED ORDER — GABAPENTIN 300 MG PO CAPS
300.0000 mg | ORAL_CAPSULE | Freq: Three times a day (TID) | ORAL | Status: DC
  Administered 2016-06-22 – 2016-06-24 (×5): 300 mg via ORAL
  Filled 2016-06-22 (×5): qty 1

## 2016-06-22 MED ORDER — PREDNISONE 20 MG PO TABS
40.0000 mg | ORAL_TABLET | Freq: Every day | ORAL | Status: DC
  Administered 2016-06-22: 40 mg via ORAL
  Filled 2016-06-22 (×2): qty 2

## 2016-06-22 MED ORDER — TRAMADOL HCL (ER BIPHASIC) 200 MG PO CP24: 200.0000 mg | ORAL_CAPSULE | Freq: Every day | ORAL | Status: DC | PRN

## 2016-06-22 MED ORDER — IPRATROPIUM-ALBUTEROL 0.5-2.5 (3) MG/3ML IN SOLN: 3.0000 mL | RESPIRATORY_TRACT | Status: DC | PRN

## 2016-06-22 MED ORDER — ROSUVASTATIN CALCIUM 20 MG PO TABS
40.0000 mg | ORAL_TABLET | Freq: Every day | ORAL | Status: DC
  Administered 2016-06-23 – 2016-06-24 (×2): 40 mg via ORAL
  Filled 2016-06-22 (×2): qty 2

## 2016-06-22 MED ORDER — ACETAMINOPHEN 650 MG RE SUPP: 650.0000 mg | Freq: Four times a day (QID) | RECTAL | Status: DC | PRN

## 2016-06-22 MED ORDER — SODIUM CHLORIDE 0.9% FLUSH
3.0000 mL | Freq: Two times a day (BID) | INTRAVENOUS | Status: DC
  Administered 2016-06-22 – 2016-06-23 (×3): 3 mL via INTRAVENOUS

## 2016-06-22 MED ORDER — CLOPIDOGREL BISULFATE 75 MG PO TABS
75.0000 mg | ORAL_TABLET | Freq: Every day | ORAL | Status: DC
  Administered 2016-06-23 – 2016-06-24 (×2): 75 mg via ORAL
  Filled 2016-06-22 (×2): qty 1

## 2016-06-22 MED ORDER — GUAIFENESIN 100 MG/5ML PO SOLN: 10.0000 mL | ORAL | Status: DC | PRN

## 2016-06-22 NOTE — ED Notes (Signed)
Spoke with [employer] of [place] at this time pt is currently being dosed for procedure.

## 2016-06-22 NOTE — ED Provider Notes (Signed)
[facility] DEPT Provider Note   CSN: [id number] Arrival date & time: 06/22/16  1119     History   Chief Complaint Chief Complaint  Patient presents with  . Cough    HPI Hannah [name] is a 71 y.o. female with history of breast cancer undergoing chemotherapy and radiation who presents from primary care office after syncopal episode. Patient saw her primary care provider for a 2 day history of cough, shortness of breath, chest tightness. Patient syncopal episode occurred after a coughing fit. Patient got to a chair and completely lost consciousness for a short time. She is unsure how long, but believes it was not more than a few seconds. She did not hit her head. Patient has not taken any medications outside of her normal regimen. Patient has noticed increasing peripheral edema over the past few days. She believes this is from her chemotherapy. Patient denies any chest pain, abdominal pain, nausea, vomiting, urinary symptoms.  HPI  Past Medical History:  Diagnosis Date  . Abnormally small mouth   . Anemia   . Bilateral carotid artery stenosis    Bilateral ICA 40-59%/  >50% LECA  . Breast cancer ([place])   . Breast cancer of upper-inner quadrant of right female breast ([place]) 12/16/2015  . Bruises easily   . CAD (coronary artery disease) cardiologist-  dr Aundra Dubin   Remote CABG in 1998 with redo in 2009  . CHF (congestive heart failure) (HCC)    EF is low normal at 50 to 55% per echo in Jan. 2011  . Dyspnea   . GERD (gastroesophageal reflux disease)   . Hearing loss of right ear   . History of non-ST elevation myocardial infarction (NSTEMI)    Sept 2014--  thought to be type II HTN w/ LHC without infarct related artery and patent grafts  . Hyperlipidemia   . Hypertension   . Iron deficiency anemia   . Moderate persistent asthma    pulmologist-  Dr. Malvin Johns  . Nausea & vomiting    related to stone  . Nonischemic cardiomyopathy ([place])   . PAF (paroxysmal atrial fibrillation) ([place])     . PONV (postoperative nausea and vomiting)    pt states patch worked well before  . Psoriasis    right leg  . Renal calculus, right   . Renal insufficiency, mild   . S/P AVR    prosthesis valve placement 2009 at same time re-do CABG  . 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 Type 2 diabetes mellitus ([place])    monitored by dr Dwyane Dee    Patient Active Problem List   Diagnosis Date Noted  . Chemotherapy-induced peripheral neuropathy ([place]) 04/27/2016  . 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 Cellulitis of left leg 04/06/2016  . Osteomyelitis of toe of left foot ([place]) 04/06/2016  . Antineoplastic chemotherapy induced anemia 03/09/2016  . Mania ([place]) 03/05/2016  . GERD (gastroesophageal reflux disease) 02/29/2016  . Chemotherapy induced nausea and vomiting 02/24/2016  . Port catheter in place 02/17/2016  . Breast cancer of upper-inner quadrant of right female breast ([place]) 12/16/2015  . Acute bronchitis 08/01/2015  . CKD (chronic kidney disease) 09/24/2013  . Chronic diastolic CHF (congestive heart failure) ([place]) 09/24/2013  . Acute on chronic combined systolic and diastolic CHF, NYHA class 3 ([place]) 07/17/2013  . Chest pain 06/15/2013  . Unstable angina ([place]) 06/14/2013  . Atrial tachycardia ([place]) 04/03/2013  . Non-ST elevation myocardial infarction (NSTEMI), initial episode of care ([place]) 02/23/2013  .  CAD (coronary artery disease) 01/24/2011  . S/P aortic valve replacement 10/27/2010  . Hypertension   . Hyperlipidemia   . SOB (shortness of breath)   . CHF (congestive heart failure) ([place])   . Renal insufficiency, mild   . Dyspnea   . Nonischemic cardiomyopathy ([place])   . Anemia   . Diabetes mellitus, type 2 ([place])   . Iron deficiency anemia   . Allergic rhinitis 10/14/2009  . COUGH 10/14/2009  . HYPERLIPIDEMIA 05/12/2009  . HYPERTENSION 05/12/2009  . Atrial fibrillation ([place]) 05/12/2009  . Asthma 05/12/2009  . DYSPNEA 05/12/2009    Past Surgical History:   Procedure Laterality Date  . AORTIC VALVE REPLACEMENT  2009   [facility] (done same time is CABG)  . BREAST LUMPECTOMY WITH RADIOACTIVE SEED AND SENTINEL LYMPH NODE BIOPSY Right 01/18/2016   Procedure: RIGHT BREAST LUMPECTOMY WITH RADIOACTIVE SEED AND SENTINEL LYMPH NODE BIOPSY;  Surgeon: DAlphonsa Overall MD;  [facility]  Service: General;  Laterality: Right;  . CARDIAC CATHETERIZATION  03/23/2008   Pre-redo CABG: L main OK, LAD (T), CFX (T), OM1 99%, RCA (T), LIMA-LAD OK, SVG-OM(?3) OK w/ little florw to OM2, SVG-RCA OK. EF NL  . CARDIOVASCULAR STRESS TEST  06-15-2013      dr hDebara Pickett  [place]/  no evidence ischemia/  normal wall motion, ef 72%  . CARPAL TUNNEL RELEASE    . COLONOSCOPY    . CORONARY ARTERY BYPASS GRAFT  1998 &  re-do 2009   Had LIMA to DX/LAD, SVG to 2 marginal branches and [facility]; SVG to 3rd OM and PD at time of redo  . CYSTOSCOPY W/ URETERAL STENT PLACEMENT Right 12/20/2014   Procedure: CYSTOSCOPY WITH RETROGRADE PYELOGRAM/URETERAL STENT PLACEMENT;  Surgeon: PCleon Gustin MD;  [facility]  Service: Urology;  Laterality: Right;  . EYE SURGERY Bilateral    cataracts  . HOLMIUM LASER APPLICATION Right 79/56/2130  Procedure:  HOLMIUM LASER LITHOTRIPSY;  Surgeon: PCleon Gustin MD;  [facility]  Service: Urology;  Laterality: Right;  . LEFT HEART CATHETERIZATION WITH CORONARY/GRAFT ANGIOGRAM N/A 02/23/2013   Procedure: LEFT HEART CATHETERIZATION WITH CBeatrix Fetters  Surgeon: MBlane Ohara MD;  [facility];  Service: Cardiovascular;  Laterality: N/A;  . PORTACATH PLACEMENT Left 01/18/2016   Procedure: INSERTION PORT-A-CATH;  Surgeon: DAlphonsa Overall MD;  [facility]  Service: General;  Laterality: Left;  . TONSILLECTOMY    . TRANSTHORACIC ECHOCARDIOGRAM  02-24-2013      mild LVH,  ef 50-55%/  AV bioprosthesis was present with  very mild stenosis and no regurg., mean grandient 116mg, peak grandient 2071m /  mild MR/  mild LAE and RAE/  moderate TR  . TUBAL LIGATION      OB History    No data available       Home Medications    Prior to Admission medications   Medication Sig Start Date End Date Taking? Authorizing Provider  albuterol (PROVENTIL HFA;VENTOLIN HFA) 108 (90 Base) MCG/ACT inhaler Inhale 2 puffs into the lungs every 6 (six) hours as needed for wheezing or shortness of breath.   Yes Historical Provider, MD  alendronate (FOSAMAX) 70 MG tablet Take 70 mg by mouth every Wednesday. Take with a full glass of water on an empty stomach.    Yes Historical Provider, MD  aspirin EC 81 MG EC tablet Take 1 tablet (81 mg total) by  mouth daily. 06/16/13  Yes Brittainy Erie Noe, PA-C  budesonide-formoterol (SYMBICORT) 160-4.5 MCG/ACT inhaler INHALE TWO PUFFS INTO LUNGS TWICE DAILY 07/21/15  Yes Collene Gobble, MD  clopidogrel (PLAVIX) 75 MG tablet Take 75 mg by mouth daily.   Yes Historical Provider, MD  furosemide (LASIX) 80 MG tablet Take 1 tablet (80 mg total) by mouth daily. 1 tablet in the AM and 1/2 tablet (40 mg) in the early afternoon. Patient taking differently: Take 40-80 mg by mouth 2 (two) times daily. Take 80 mg in the morning and then 58m in the afternoon, if pt is going to be out during the day the pt takes 442min the am and then 80103mhen she gets back home 05/01/16 07/30/16 Yes LorBurtis JunesP  gabapentin (NEURONTIN) 300 MG capsule Take 1 capsule (300 mg total) by mouth 3 (three) times daily. 03/26/16  Yes VinNicholas LoseD  Insulin Disposable Pump (V-GO 40) KIT USE 1 PER DAY 06/22/16  Yes AjaElayne SnareD  insulin lispro (HUMALOG KWIKPEN) 100 UNIT/ML KiwkPen INJECT 10 UNITS INTO THE SKIN WITH MEALS PLUS SLIDING SCALE Patient taking differently: Inject 10-12 Units into the skin daily as needed. per SLIDING SCALE 05/10/16  Yes AjaElayne SnareD  insulin lispro (HUMALOG) 100 UNIT/ML injection Inject 76 units  daily with  V-GO pump 05/10/16  Yes AjaElayne SnareD  lidocaine-prilocaine (EMLA) cream Apply to affected area once Patient taking differently: Apply 1 application topically daily as needed (port access). Apply to affected area once 01/26/16  Yes VinNicholas LoseD  loratadine (CLARITIN) 10 MG tablet Take 10 mg by mouth daily.   Yes Historical Provider, MD  metoprolol succinate (TOPROL-XL) 100 MG 24 hr tablet Take 1 tablet (100 mg total) by mouth 2 (two) times daily. Take with or immediately following a meal. 05/17/16  Yes LorBurtis JunesP  omeprazole (PRILOSEC OTC) 20 MG tablet Take 20 mg by mouth daily as needed (heartburn).    Yes Historical Provider, MD  potassium chloride SA (KLOR-CON M20) 20 MEQ tablet Take 1 tablet (20 mEq total) by mouth 2 (two) times daily. Patient taking differently: Take 20 mEq by mouth 2 (two) times daily as needed (low potassium).  05/01/16 07/30/16 Yes LorBurtis JunesP  rosuvastatin (CRESTOR) 40 MG tablet TAKE ONE TABLET BY MOUTH ONCE DAILY Patient taking differently: TAKE 43m39mBLET BY MOUTH ONCE DAILY 10/25/15  Yes DaltLarey Dresser  TraMADol HCl 200 MG CP24 Take 200 mg by mouth daily as needed (for back pain).  03/15/16  Yes Historical Provider, MD  TRULICITY 0.758.780MV/6.7MCN Inject 0.75 mg into the skin every Wednesday.  12/30/15  Yes Historical Provider, MD  ZETIA 10 MG tablet Take 1 tablet by mouth  daily Patient taking differently: Take 10mg76mmouth once daily 11/17/15  Yes Ajay Elayne Snare   Family History Family History  Problem Relation Age of Onset  . Heart disease Mother   . Heart disease Father   . Heart failure Father   . Diabetes Maternal Grandmother   . Diabetes Son     Social History Social History  Substance Use Topics  . Smoking status: Never Smoker  . Smokeless tobacco: Never Used  . Alcohol use No     Allergies   Amoxicillin; Imdur [isosorbide dinitrate]; Latex; and Tetracycline   Review of Systems Review of Systems   Constitutional: Negative for chills and fever.  HENT: Negative for facial swelling and sore throat.  Respiratory: Positive for cough, chest tightness and shortness of breath.   Cardiovascular: Negative for chest pain.  Gastrointestinal: Negative for abdominal pain, nausea and vomiting.  Genitourinary: Negative for dysuria.  Musculoskeletal: Negative for back pain.  Skin: Negative for rash and wound.  Neurological: Positive for syncope. Negative for headaches.  Psychiatric/Behavioral: The patient is not nervous/anxious.      Physical Exam Updated Vital Signs BP 116/92   Pulse 92   Temp 99.4 F (37.4 C) (Oral)   Resp 16   SpO2 97%   Physical Exam  Constitutional: She appears well-developed and well-nourished. No distress.  HENT:  Head: Normocephalic and atraumatic.  Mouth/Throat: Oropharynx is clear and moist. No oropharyngeal exudate.  Eyes: Conjunctivae are normal. Pupils are equal, round, and reactive to light. Right eye exhibits no discharge. Left eye exhibits no discharge. No scleral icterus.  Neck: Normal range of motion. Neck supple. No thyromegaly present.  Cardiovascular: Normal rate, regular rhythm, normal heart sounds and intact distal pulses.  Exam reveals no gallop and no friction rub.   No murmur heard. Pulmonary/Chest: Effort normal and breath sounds normal. No stridor. No respiratory distress. She has no wheezes. She has no rales.  Abdominal: Soft. Bowel sounds are normal. She exhibits no distension. There is no tenderness. There is no rebound and no guarding.  Musculoskeletal: She exhibits no edema.  Lymphadenopathy:    She has no cervical adenopathy.  Neurological: She is alert. Coordination normal.  Skin: Skin is warm and dry. No rash noted. She is not diaphoretic. No pallor.  Psychiatric: She has a normal mood and affect.  Nursing note and vitals reviewed.    ED Treatments / Results  Labs (all labs ordered are listed, but only abnormal results are  displayed) Labs Reviewed  BASIC METABOLIC PANEL - Abnormal; Notable for the following:       Result Value   Chloride 94 (*)    Glucose, Bld 221 (*)    BUN 33 (*)    Creatinine, Ser 1.70 (*)    GFR calc non Af Amer 29 (*)    GFR calc Af Amer 34 (*)    All other components within normal limits  CBC WITH DIFFERENTIAL/PLATELET - Abnormal; Notable for the following:    RBC 3.49 (*)    Hemoglobin 10.5 (*)    HCT 32.8 (*)    Platelets 126 (*)    Lymphs Abs 0.4 (*)    All other components within normal limits  URINALYSIS, ROUTINE W REFLEX MICROSCOPIC - Abnormal; Notable for the following:    APPearance HAZY (*)    Glucose, UA 50 (*)    Hgb urine dipstick SMALL (*)    Protein, ur 30 (*)    Bacteria, UA MANY (*)    Squamous Epithelial / LPF 0-5 (*)    All other components within normal limits  CBG MONITORING, ED - Abnormal; Notable for the following:    Glucose-Capillary 211 (*)    All other components within normal limits  I-STAT TROPOININ, ED    EKG  EKG Interpretation None       Radiology Dg Chest 2 View  Result Date: 06/22/2016 CLINICAL DATA:  Cough, shortness of breath, chest tightness for the past 2 days. Syncopal episode. EXAM: CHEST  2 VIEW COMPARISON:  04/25/2016. FINDINGS: Normal sized heart with an interval decrease in size. Stable post CABG changes and prosthetic aortic valve. Stable left subclavian porta catheter. Clear lungs with normal vascularity. Mild thoracic spine degenerative changes. Aortic  calcifications. Old right humeral neck fracture and avascular necrosis of the right humeral head. IMPRESSION: No acute abnormality.  Aortic atherosclerosis. Electronically Signed   By: Claudie Revering M.D.   On: 06/22/2016 12:20    Procedures Procedures (including critical care time)  Medications Ordered in ED Medications - No data to display   Initial Impression / Assessment and Plan / ED Course  I have reviewed the triage vital signs and the nursing  notes.  Pertinent labs & imaging results that were available during my care of the patient were reviewed by me and considered in my medical decision making (see chart for details).  Clinical Course     CBC shows hemoglobin 10.5, which is improved from 6 days ago. BMP shows chloride 94, glucose 221, BUN 33, creatinine 1.7, GFR 29. Troponin 0.02. UA shows small hematuria, many bacteria. Due to concern for PE considering patient's symptoms, hypoxia, and hypercoagulable state, will do a VQ scan to rule out PE. At shift change, patient care transferred to [facility], PA-C for continued evaluation, follow up of VQ scan and determination of disposition. Anticipate discharge if V/Q scan shows low probability and patient does not have new oxygen requirement.    Final Clinical Impressions(s) / ED Diagnoses   Final diagnoses:  Shortness of breath  Syncope    New Prescriptions New Prescriptions   No medications on file     Frederica Kuster, Hershal Coria 06/22/16 [place], MD 06/23/16 [phone]

## 2016-06-22 NOTE — H&P (Signed)
History and Physical    Hannah [name]:[id number] DOB: 1945-12-24 DOA: 06/22/2016  PCP: Nicola Girt, DO   Patient coming from: PCP's office  Chief Complaint: Cough, syncope, fever, weakness  HPI: Hannah [name] is a 71 y.o. woman with multiple comorbidities including CAD S/P CABG x 2, Type 2 DM (uses a disposable insulin pump; changed qHS), paroxysmal atrial fibrillation (CHADS-Vasc score of 7, she is not anticoagulated), S/P aortic valve replacement, moderate persistent asthma, CKD 3, compensated diastolic CHF, HTN, and breast cancer (S/P chemotherapy, currently undergoing radiation therapy M-F, Dr. Sondra Come) who presented to her PCP's office today for evaluation of 2 days of cough productive of clear sputum, nasal congestion, chest tightness associated with cough, shortness of breath, and fever.  While there, she had a syncopal episode (reportedly after a coughing spell, but the patient does not recall this), and she was transferred to the ED for further evaluation.  The patient reports fever to 100.4 at home.  She has had chills and sweats.  She had single episodes of nausea, vomiting, and diarrhea today.  Oral intake has been down.  According to her granddaughter, she has had several near-syncopal episodes in the past several days.  The patient says that she becomes light-headed prior to going to the ground.  No preceding palpitations.  No pleurisy.  No gross bleeding/blood loss.  ED Course: EKG shows NSR.  Chest xray negative for acute process.  V/Q scan low probability for PE.  Hgb stable.  Creatinine slightly up from baseline.  Mild thrombocytopenia.  First troponin negative.  She is hypoxic with ambulation on RA, desats to 87%.  ED has referred for observation.  Lactic acid, blood cultures, BNP, flu screen pending.  Review of Systems: As per HPI otherwise 10 point review of systems negative.    Past Medical History:  Diagnosis Date  . Abnormally small mouth   . Anemia   .  Bilateral carotid artery stenosis    Bilateral ICA 40-59%/  >50% LECA  . Breast cancer ([place])   . Breast cancer of upper-inner quadrant of right female breast ([place]) 12/16/2015  . Bruises easily   . CAD (coronary artery disease) cardiologist-  dr Aundra Dubin   Remote CABG in 1998 with redo in 2009  . CHF (congestive heart failure) (HCC)    EF is low normal at 50 to 55% per echo in Jan. 2011  . Dyspnea   . GERD (gastroesophageal reflux disease)   . Hearing loss of right ear   . History of non-ST elevation myocardial infarction (NSTEMI)    Sept 2014--  thought to be type II HTN w/ LHC without infarct related artery and patent grafts  . Hyperlipidemia   . Hypertension   . Iron deficiency anemia   . Moderate persistent asthma    pulmologist-  Dr. Malvin Johns  . Nausea & vomiting    related to stone  . Nonischemic cardiomyopathy ([place])   . PAF (paroxysmal atrial fibrillation) ([place])   . PONV (postoperative nausea and vomiting)    pt states patch worked well before  . Psoriasis    right leg  . Renal calculus, right   . Renal insufficiency, mild   . S/P AVR    prosthesis valve placement 2009 at same time re-do CABG  . Stroke [facility])    residual rt hearing loss  . Type 2 diabetes mellitus ([place])    monitored by dr Dwyane Dee    Past Surgical History:  Procedure Laterality Date  .  AORTIC VALVE REPLACEMENT  2009   [facility] (done same time is CABG)  . BREAST LUMPECTOMY WITH RADIOACTIVE SEED AND SENTINEL LYMPH NODE BIOPSY Right 01/18/2016   Procedure: RIGHT BREAST LUMPECTOMY WITH RADIOACTIVE SEED AND SENTINEL LYMPH NODE BIOPSY;  Surgeon: DAlphonsa Overall MD;  [facility]  Service: General;  Laterality: Right;  . CARDIAC CATHETERIZATION  03/23/2008   Pre-redo CABG: L main OK, LAD (T), CFX (T), OM1 99%, RCA (T), LIMA-LAD OK, SVG-OM(?3) OK w/ little florw to OM2, SVG-RCA OK. EF NL  . CARDIOVASCULAR STRESS TEST  06-15-2013      dr hDebara Pickett  [place]fusion study/  no evidence  ischemia/  normal wall motion, ef 72%  . CARPAL TUNNEL RELEASE    . COLONOSCOPY    . CORONARY ARTERY BYPASS GRAFT  1998 &  re-do 2009   Had LIMA to DX/LAD, SVG to 2 marginal branches and [facility]; SVG to 3rd OM and PD at time of redo  . CYSTOSCOPY W/ URETERAL STENT PLACEMENT Right 12/20/2014   Procedure: CYSTOSCOPY WITH RETROGRADE PYELOGRAM/URETERAL STENT PLACEMENT;  Surgeon: PCleon Gustin MD;  [facility]  Service: Urology;  Laterality: Right;  . EYE SURGERY Bilateral    cataracts  . HOLMIUM LASER APPLICATION Right 70/01/6760  Procedure:  HOLMIUM LASER LITHOTRIPSY;  Surgeon: PCleon Gustin MD;  [facility]  Service: Urology;  Laterality: Right;  . LEFT HEART CATHETERIZATION WITH CORONARY/GRAFT ANGIOGRAM N/A 02/23/2013   Procedure: LEFT HEART CATHETERIZATION WITH CBeatrix Fetters  Surgeon: MBlane Ohara MD;  [facility];  Service: Cardiovascular;  Laterality: N/A;  . PORTACATH PLACEMENT Left 01/18/2016   Procedure: INSERTION PORT-A-CATH;  Surgeon: DAlphonsa Overall MD;  [place] [facility]  Service: General;  Laterality: Left;  . TONSILLECTOMY    . TRANSTHORACIC ECHOCARDIOGRAM  02-24-2013      mild LVH,  ef 50-55%/  AV bioprosthesis was present with very mild stenosis and no regurg., mean grandient 11mg, peak grandient 2041m /  mild MR/  mild LAE and RAE/  moderate TR  . TUBAL LIGATION       reports that she has never smoked. She has never used smokeless tobacco. She reports that she does not drink alcohol or use drugs.  She is married.  She has 2 children, five grandchildren.  Allergies  Allergen Reactions  . Amoxicillin Rash    Has patient had a PCN reaction causing immediate rash, facial/tongue/throat swelling, SOB or lightheadedness with hypotension: No Has patient had a PCN reaction causing severe rash involving mucus membranes or skin necrosis: Yes Has patient had a PCN reaction that required  hospitalization No Has patient had a PCN reaction occurring within the last 10 years: No If all of the above answers are "NO", then may proceed with Cephalosporin use.   . Imdur [Isosorbide Dinitrate] Other (See Comments)    Headache/severe hypotension/Syncope  . Latex Itching  . Tetracycline Rash    Family History  Problem Relation Age of Onset  . Heart disease Mother   . Heart disease Father   . Heart failure Father   . Diabetes Maternal Grandmother   . Diabetes Son      Prior to Admission medications   Medication Sig Start Date End Date Taking? Authorizing Provider  albuterol (PROVENTIL HFA;VENTOLIN HFA) 108 (90 Base) MCG/ACT inhaler Inhale 2 puffs into the lungs every 6 (six) hours as needed for wheezing or shortness of  breath.   Yes Historical Provider, MD  alendronate (FOSAMAX) 70 MG tablet Take 70 mg by mouth every Wednesday. Take with a full glass of water on an empty stomach.    Yes Historical Provider, MD  aspirin EC 81 MG EC tablet Take 1 tablet (81 mg total) by mouth daily. 06/16/13  Yes Brittainy Erie Noe, PA-C  budesonide-formoterol (SYMBICORT) 160-4.5 MCG/ACT inhaler INHALE TWO PUFFS INTO LUNGS TWICE DAILY 07/21/15  Yes Collene Gobble, MD  clopidogrel (PLAVIX) 75 MG tablet Take 75 mg by mouth daily.   Yes Historical Provider, MD  furosemide (LASIX) 80 MG tablet Take 1 tablet (80 mg total) by mouth daily. 1 tablet in the AM and 1/2 tablet (40 mg) in the early afternoon. Patient taking differently: Take 40-80 mg by mouth 2 (two) times daily. Take 80 mg in the morning and then 71m in the afternoon, if pt is going to be out during the day the pt takes 461min the am and then 8024mhen she gets back home 05/01/16 07/30/16 Yes LorBurtis JunesP  gabapentin (NEURONTIN) 300 MG capsule Take 1 capsule (300 mg total) by mouth 3 (three) times daily. 03/26/16  Yes VinNicholas LoseD  Insulin Disposable Pump (V-GO 40) KIT USE 1 PER DAY 06/22/16  Yes AjaElayne SnareD  insulin lispro (HUMALOG  KWIKPEN) 100 UNIT/ML KiwkPen INJECT 10 UNITS INTO THE SKIN WITH MEALS PLUS SLIDING SCALE Patient taking differently: Inject 10-12 Units into the skin daily as needed. per SLIDING SCALE 05/10/16  Yes AjaElayne SnareD  insulin lispro (HUMALOG) 100 UNIT/ML injection Inject 76 units daily with  V-GO pump 05/10/16  Yes AjaElayne SnareD  lidocaine-prilocaine (EMLA) cream Apply to affected area once Patient taking differently: Apply 1 application topically daily as needed (port access). Apply to affected area once 01/26/16  Yes VinNicholas LoseD  loratadine (CLARITIN) 10 MG tablet Take 10 mg by mouth daily.   Yes Historical Provider, MD  metoprolol succinate (TOPROL-XL) 100 MG 24 hr tablet Take 1 tablet (100 mg total) by mouth 2 (two) times daily. Take with or immediately following a meal. 05/17/16  Yes LorBurtis JunesP  omeprazole (PRILOSEC OTC) 20 MG tablet Take 20 mg by mouth daily as needed (heartburn).    Yes Historical Provider, MD  potassium chloride SA (KLOR-CON M20) 20 MEQ tablet Take 1 tablet (20 mEq total) by mouth 2 (two) times daily. Patient taking differently: Take 20 mEq by mouth 2 (two) times daily as needed (low potassium).  05/01/16 07/30/16 Yes LorBurtis JunesP  rosuvastatin (CRESTOR) 40 MG tablet TAKE ONE TABLET BY MOUTH ONCE DAILY Patient taking differently: TAKE 55m48mBLET BY MOUTH ONCE DAILY 10/25/15  Yes DaltLarey Dresser  TraMADol HCl 200 MG CP24 Take 200 mg by mouth daily as needed (for back pain).  03/15/16  Yes Historical Provider, MD  TRULICITY 0.759.510OA/4.1YSN Inject 0.75 mg into the skin every Wednesday.  12/30/15  Yes Historical Provider, MD  ZETIA 10 MG tablet Take 1 tablet by mouth  daily Patient taking differently: Take 10mg52mmouth once daily 11/17/15  Yes Ajay Elayne Snare   Physical Exam: Vitals:   06/22/16 1542 06/22/16 1846 06/22/16 1944 06/22/16 2003  BP: 116/92 145/66  114/58  Pulse: 92 92  95  Resp: _0 Temp:      TempSrc:      SpO2: 97% 94% 95% 90%  Constitutional: NAD, calm, comfortable, chronically ill appearing but nontoxic Vitals:   06/22/16 1542 06/22/16 1846 06/22/16 1944 06/22/16 2003  BP: 116/92 145/66  114/58  Pulse: 92 92  95  Resp: _0 Temp:      TempSrc:      SpO2: 97% 94% 95% 90%   Eyes: PERRL, lids and conjunctivae normal ENMT: Mucous membranes are dry.  Posterior pharynx clear of any exudate or lesions. Normal dentition.  Neck: normal appearance, supple Respiratory: Bilateral wheeze and mild ronchi.  Normal respiratory effort. No accessory muscle use.  Cardiovascular: Normal rate, regular rhythm, no murmurs / rubs / gallops. No significant pitting edema noted; she is wearing compression hose.  2+ pedal pulses. GI: abdomen is soft and compressible.  No distention.  No tenderness.  No masses palpated.  Bowel sounds are present. Musculoskeletal:  No joint deformity in upper and lower extremities. Good ROM, no contractures. Normal muscle tone.  Skin: no rashes, warm and dry Neurologic: CN 2-12 grossly intact. Sensation intact, Strength symmetric bilaterally, 5/5. Psychiatric: Normal judgment and insight. Alert and oriented x 3. Normal mood.     Labs on Admission: I have personally reviewed following labs and imaging studies  CBC:  Recent Labs Lab 06/22/16 1234  WBC 6.2  NEUTROABS 5.1  HGB 10.5*  HCT 32.8*  MCV 94.0  PLT 937*   Basic Metabolic Panel:  Recent Labs Lab 06/22/16 1234  NA 135  K 3.9  CL 94*  CO2 31  GLUCOSE 221*  BUN 33*  CREATININE 1.70*  CALCIUM 9.0   GFR: Estimated Creatinine Clearance: 34.6 mL/min (by C-G formula based on SCr of 1.7 mg/dL (H)).  CBG:  Recent Labs Lab 06/22/16 1135  GLUCAP 211*   First troponin negative.  Urine analysis:    Component Value Date/Time   COLORURINE YELLOW 06/22/2016 1207   APPEARANCEUR HAZY (A) 06/22/2016 1207   LABSPEC 1.012 06/22/2016 1207   LABSPEC 1.020 02/24/2016 1119   PHURINE 6.0 06/22/2016 1207   GLUCOSEU 50  (A) 06/22/2016 1207   GLUCOSEU Negative 02/24/2016 1119   HGBUR SMALL (A) 06/22/2016 1207   BILIRUBINUR NEGATIVE 06/22/2016 1207   BILIRUBINUR Negative 02/24/2016 1119   KETONESUR NEGATIVE 06/22/2016 1207   PROTEINUR 30 (A) 06/22/2016 1207   UROBILINOGEN 0.2 02/24/2016 1119   NITRITE NEGATIVE 06/22/2016 1207   LEUKOCYTESUR NEGATIVE 06/22/2016 1207   LEUKOCYTESUR Trace 02/24/2016 1119   Sepsis Labs:  Lactic acid level pending.  Radiological Exams on Admission: Dg Chest 2 View  Result Date: 06/22/2016 CLINICAL DATA:  Cough, shortness of breath, chest tightness for the past 2 days. Syncopal episode. EXAM: CHEST  2 VIEW COMPARISON:  04/25/2016. FINDINGS: Normal sized heart with an interval decrease in size. Stable post CABG changes and prosthetic aortic valve. Stable left subclavian porta catheter. Clear lungs with normal vascularity. Mild thoracic spine degenerative changes. Aortic calcifications. Old right humeral neck fracture and avascular necrosis of the right humeral head. IMPRESSION: No acute abnormality.  Aortic atherosclerosis. Electronically Signed   By: Claudie Revering M.D.   On: 06/22/2016 12:20   Nm Pulmonary Perf And Vent  Result Date: 06/22/2016 CLINICAL DATA:  Chest tightness with the breast. Cough and fever. History of breast carcinoma. EXAM: NUCLEAR MEDICINE VENTILATION - PERFUSION LUNG SCAN TECHNIQUE: Ventilation images were obtained in multiple projections using inhaled aerosol Tc-66mDTPA. Perfusion images were obtained in multiple projections after intravenous injection of Tc-938mAA. RADIOPHARMACEUTICALS:  31.0 mCi Technetium-9940mPA aerosol inhalation and 4.1 mCi  Technetium-3mMAA IV COMPARISON:  Current chest radiograph. Previous ventilation-perfusion study dated 04/09/2016. FINDINGS: Ventilation: There is heterogeneous radiotracer accumulation in the lungs without a discrete segmental appearing defect. Perfusion: There is also heterogeneous radiotracer accumulation to the  lungs on profusion imaging similar to that seen with ventilation, but less prominent, without a segmental type defect. IMPRESSION: 1. No segmental type profusion defect is seen to suggest a pulmonary thromboembolism. 2. Patchy bilateral areas of nonsegmental relative decreased ventilation and perfusion. This suggests small airways disease. Electronically Signed   By: DLajean ManesM.D.   On: 06/22/2016 18:12    EKG: Independently reviewed. NSR.  Low voltage.  No acute ST segment changes.  Assessment/Plan Principal Problem:   Acute bronchitis Active Problems:   Essential hypertension   Atrial fibrillation (HCC)   Asthma   DYSPNEA   Cough   CHF (congestive heart failure) (HCC)   Renal insufficiency, mild   Diabetes mellitus, type 2 (HCC)   S/P aortic valve replacement   CAD (coronary artery disease)   Breast cancer of upper-inner quadrant of right [place] breast [place]   Port catheter in place   Hypoxia      Acute hypoxia secondary to acute bronchitis causing mild exacerbation of her moderate persistent asthma.  She reports low grade fever and has had a productive cough.  This is likely a viral process, but ER is also concerned that she could have an early pneumonia. --Supplemental oxygen, wean as tolerated.  Plan to repeat ambulatory O2 sat on RA in the AM --Prednisone 461mdaily, first now --Empiric Azithromycin 50079maily, first now --Blood cultures pending --No evidence of sepsis based on available data --One dose of empiric magnesium IV now (though respiratory status is not very tenuous at this point) --Duonebs QID and q2h prn --guaifenesin prn --Continue claritin --Respiratory therapy with incentive spirometry, flutter valve prn  Syncope, ?post-tussive.  Suspect she has mild degree of dehydation. --Telemetry monitoring --Serial troponin --Orthostatic vital signs --1L NS now then hold on maintenance fluids due to history of CHF --HOLD lasix for now --Repeat BMP in the  AM  CKD --Relatively stable  HTN, PAF --Continue beta blocker  CAD --Aspirin and plavix  HLD --Statin, zetia  DM --Continue disposable insulin pump qHS for now.  Pharmacy notified. 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  DVT prophylaxis: Low risk, outpatient status Code Status: FULL Family Communication: Husband, granddaughter present at bedside in the ED at time of admission. Disposition Plan: Expect she will go home at discharge. Consults called: NONE Admission status: Place in observation with telemetry monitoring.   TIME SPENT: 70 minutes   CarEber Jones [facility] Pager [phone] 7PM-7AM, please contact night-coverage [email or web address] Password [email or web address]/05/2017, 8:22 PM

## 2016-06-22 NOTE — Progress Notes (Signed)
Called by nursing regarding patient - admitted with hypoxia, syncope now with another episode of syncope while using the toilet.  Recovered spontaneously.  Vital signs now stable, asymptomatic. Bedrest for now.  Continue to monitor closely.

## 2016-06-22 NOTE — ED Notes (Signed)
Pt ambulated a short distance with c/o dizziness.  Pt O2 Sat prior to ambulation: 98% on 2L Pt O2 Sat during ambulation: 92% on RA  Pt current O2 Sat while laying down on RA: 95%

## 2016-06-22 NOTE — ED Provider Notes (Signed)
Hannah [name] is a 71 y.o. female, with a history of DM, CHF, active breast cancer, and HTN, presenting to the ED with productive cough and generalized weakness for the last two days. Has been using her inhalers without improvement.  Endorses recent antibiotic use for cellulitis April 06, 2016. She was admitted for treatment. Last chemo was around Thanksgiving and she is not scheduled for more. Halfway through her radiation treatments. Last treatment was yesterday.   Patient's medical oncologist is [place]. Radiation oncologist is Dr. Sondra Come.   HPI from Armstead Peaks, PA-C: "Hannah [name] is a 71 y.o. female with history of breast cancer undergoing chemotherapy and radiation who presents from primary care office after syncopal episode. Patient saw her primary care provider for a 2 day history of cough, shortness of breath, chest tightness. Patient syncopal episode occurred after a coughing fit. Patient got to a chair and completely lost consciousness for a short time. She is unsure how long, but believes it was not more than a few seconds. She did not hit her head. Patient has not taken any medications outside of her normal regimen. Patient has noticed increasing peripheral edema over the past few days. She believes this is from her chemotherapy. Patient denies any chest pain, abdominal pain, nausea, vomiting, urinary symptoms."   Past Medical History:  Diagnosis Date  . Abnormally small mouth   . Anemia   . Bilateral carotid artery stenosis    Bilateral ICA 40-59%/  >50% LECA  . Breast cancer ([place])   . Breast cancer of upper-inner quadrant of right female breast ([place]) 12/16/2015  . Bruises easily   . CAD (coronary artery disease) cardiologist-  dr Aundra Dubin   Remote CABG in 1998 with redo in 2009  . CHF (congestive heart failure) (HCC)    EF is low normal at 50 to 55% per echo in Jan. 2011  . Dyspnea   . GERD (gastroesophageal reflux disease)   . Hearing loss of right ear   . History of non-ST  elevation myocardial infarction (NSTEMI)    Sept 2014--  thought to be type II HTN w/ LHC without infarct related artery and patent grafts  . Hyperlipidemia   . Hypertension   . Iron deficiency anemia   . Moderate persistent asthma    pulmologist-  Dr. Malvin Johns  . Nausea & vomiting    related to stone  . Nonischemic cardiomyopathy ([place])   . PAF (paroxysmal atrial fibrillation) ([place])   . PONV (postoperative nausea and vomiting)    pt states patch worked well before  . Psoriasis    right leg  . Renal calculus, right   . Renal insufficiency, mild   . S/P AVR    prosthesis valve placement 2009 at same time re-do CABG  . 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 Type 2 diabetes mellitus (HCC)    monitored by dr Dwyane Dee    Physical Exam  BP 116/92   Pulse 92   Temp 99.4 F (37.4 C) (Oral)   Resp 16   SpO2 97%   Physical Exam  Constitutional: She appears well-developed and well-nourished. No distress.  HENT:  Head: Normocephalic and atraumatic.  Eyes: Conjunctivae are normal.  Neck: Neck supple.  Cardiovascular: Normal rate, regular rhythm, normal heart sounds and intact distal pulses.   Pulmonary/Chest: She has wheezes in the right lower field, the left upper field and the left lower field.  Increased work of breathing at rest. Speaks in short  phrases and having to rest in between.  Abdominal: Soft. There is no tenderness. There is no guarding.  Musculoskeletal: She exhibits no edema.  Lymphadenopathy:    She has no cervical adenopathy.  Neurological: She is alert.  Skin: Skin is warm and dry. She is not diaphoretic.  Psychiatric: She has a normal mood and affect. Her behavior is normal.  Nursing note and vitals reviewed.   ED Course  Procedures   Results for orders placed or performed during the hospital encounter of 67/61/95  Basic metabolic panel  Result Value Ref Range   Sodium 135 135 - 145 mmol/L   Potassium 3.9 3.5 - 5.1 mmol/L   Chloride 94 (L) 101 - 111  mmol/L   CO2 31 22 - 32 mmol/L   Glucose, Bld 221 (H) 65 - 99 mg/dL   BUN 33 (H) 6 - 20 mg/dL   Creatinine, Ser 1.70 (H) 0.44 - 1.00 mg/dL   Calcium 9.0 8.9 - 10.3 mg/dL   GFR calc non Af Amer 29 (L) >60 mL/min   GFR calc Af Amer 34 (L) >60 mL/min   Anion gap 10 5 - 15  CBC with Differential  Result Value Ref Range   WBC 6.2 4.0 - 10.5 K/uL   RBC 3.49 (L) 3.87 - 5.11 MIL/uL   Hemoglobin 10.5 (L) 12.0 - 15.0 g/dL   HCT 32.8 (L) 36.0 - 46.0 %   MCV 94.0 78.0 - 100.0 fL   MCH 30.1 26.0 - 34.0 pg   MCHC 32.0 30.0 - 36.0 g/dL   RDW 13.9 11.5 - 15.5 %   Platelets 126 (L) 150 - 400 K/uL   Neutrophils Relative % 83 %   Neutro Abs 5.1 1.7 - 7.7 K/uL   Lymphocytes Relative 6 %   Lymphs Abs 0.4 (L) 0.7 - 4.0 K/uL   Monocytes Relative 7 %   Monocytes Absolute 0.5 0.1 - 1.0 K/uL   Eosinophils Relative 3 %   Eosinophils Absolute 0.2 0.0 - 0.7 K/uL   Basophils Relative 1 %   Basophils Absolute 0.0 0.0 - 0.1 K/uL  Urinalysis, Routine w reflex microscopic  Result Value Ref Range   Color, Urine YELLOW YELLOW   APPearance HAZY (A) CLEAR   Specific Gravity, Urine 1.012 1.005 - 1.030   pH 6.0 5.0 - 8.0   Glucose, UA 50 (A) NEGATIVE mg/dL   Hgb urine dipstick SMALL (A) NEGATIVE   Bilirubin Urine NEGATIVE NEGATIVE   Ketones, ur NEGATIVE NEGATIVE mg/dL   Protein, ur 30 (A) NEGATIVE mg/dL   Nitrite NEGATIVE NEGATIVE   Leukocytes, UA NEGATIVE NEGATIVE   RBC / HPF 0-5 0 - 5 RBC/hpf   WBC, UA 6-30 0 - 5 WBC/hpf   Bacteria, UA MANY (A) NONE SEEN   Squamous Epithelial / LPF 0-5 (A) NONE SEEN   Mucous PRESENT    Hyaline Casts, UA PRESENT   Influenza panel by PCR (type A & B, H1N1)  Result Value Ref Range   Influenza A By PCR POSITIVE (A) NEGATIVE   Influenza B By PCR NEGATIVE NEGATIVE  Brain natriuretic peptide  Result Value Ref Range   B Natriuretic Peptide 257.0 (H) 0.0 - 100.0 pg/mL  Troponin I (q 6hr x 3)  Result Value Ref Range   Troponin I <0.03 <0.03 ng/mL  Glucose, capillary   Result Value Ref Range   Glucose-Capillary 186 (H) 65 - 99 mg/dL  CBG monitoring, ED  Result Value Ref Range   Glucose-Capillary 211 (H) 65 -  99 mg/dL  I-stat troponin, ED  Result Value Ref Range   Troponin i, poc 0.02 0.00 - 0.08 ng/mL   Comment 3          I-Stat CG4 Lactic Acid, ED  Result Value Ref Range   Lactic Acid, Venous 1.38 0.5 - 1.9 mmol/L   BUN  Date Value Ref Range Status  06/22/2016 33 (H) 6 - 20 mg/dL Final  06/15/2016 37 (H) 8 - 27 mg/dL Final  05/23/2016 37 (H) 7 - 25 mg/dL Final  05/08/2016 31 (H) 6 - 23 mg/dL Final  05/08/2016 32 (H) 7 - 25 mg/dL Final  04/27/2016 29.5 (H) 7.0 - 26.0 mg/dL Final  04/04/2016 32.1 (H) 7.0 - 26.0 mg/dL Final  03/30/2016 22.7 7.0 - 26.0 mg/dL Final  03/09/2016 24.5 7.0 - 26.0 mg/dL Final   Creatinine  Date Value Ref Range Status  04/27/2016 1.3 (H) 0.6 - 1.1 mg/dL Final  04/04/2016 1.4 (H) 0.6 - 1.1 mg/dL Final  03/30/2016 1.1 0.6 - 1.1 mg/dL Final  03/09/2016 1.6 (H) 0.6 - 1.1 mg/dL Final   Creat  Date Value Ref Range Status  05/23/2016 1.66 (H) 0.60 - 0.93 mg/dL Final    Comment:      For patients > or = 71 years of age: The upper reference limit for Creatinine is approximately 13% higher for people identified as African-American.     05/08/2016 1.38 (H) 0.60 - 0.93 mg/dL Final    Comment:      For patients > or = 71 years of age: The upper reference limit for Creatinine is approximately 13% higher for people identified as African-American.      Creatinine, Ser  Date Value Ref Range Status  06/22/2016 1.70 (H) 0.44 - 1.00 mg/dL Final  06/15/2016 1.39 (H) 0.57 - 1.00 mg/dL Final  05/08/2016 1.45 (H) 0.40 - 1.20 mg/dL Final  04/09/2016 1.70 (H) 0.44 - 1.00 mg/dL Final   Hemoglobin  Date Value Ref Range Status  06/22/2016 10.5 (L) 12.0 - 15.0 g/dL Final  05/23/2016 10.3 (L) 11.7 - 15.5 g/dL Final  05/08/2016 9.5 (L) 11.7 - 15.5 g/dL Final  04/10/2016 7.8 (L) 12.0 - 15.0 g/dL Final   HGB  Date Value Ref  Range Status  04/27/2016 8.5 (L) 11.6 - 15.9 g/dL Final  04/04/2016 9.6 (L) 11.6 - 15.9 g/dL Final  03/30/2016 7.3 (L) 11.6 - 15.9 g/dL Final  03/09/2016 8.9 (L) 11.6 - 15.9 g/dL Final    Dg Chest 2 View  Result Date: 06/22/2016 CLINICAL DATA:  Cough, shortness of breath, chest tightness for the past 2 days. Syncopal episode. EXAM: CHEST  2 VIEW COMPARISON:  04/25/2016. FINDINGS: Normal sized heart with an interval decrease in size. Stable post CABG changes and prosthetic aortic valve. Stable left subclavian porta catheter. Clear lungs with normal vascularity. Mild thoracic spine degenerative changes. Aortic calcifications. Old right humeral neck fracture and avascular necrosis of the right humeral head. IMPRESSION: No acute abnormality.  Aortic atherosclerosis. Electronically Signed   By: Claudie Revering M.D.   On: 06/22/2016 12:20   Nm Pulmonary Perf And Vent  Result Date: 06/22/2016 CLINICAL DATA:  Chest tightness with the breast. Cough and fever. History of breast carcinoma. EXAM: NUCLEAR MEDICINE VENTILATION - PERFUSION LUNG SCAN TECHNIQUE: Ventilation images were obtained in multiple projections using inhaled aerosol Tc-35m DTPA. Perfusion images were obtained in multiple projections after intravenous injection of Tc-28m MAA. RADIOPHARMACEUTICALS:  31.0 mCi Technetium-10m DTPA aerosol inhalation and 4.1 mCi Technetium-49m MAA IV  COMPARISON:  Current chest radiograph. Previous ventilation-perfusion study dated 04/09/2016. FINDINGS: Ventilation: There is heterogeneous radiotracer accumulation in the lungs without a discrete segmental appearing defect. Perfusion: There is also heterogeneous radiotracer accumulation to the lungs on profusion imaging similar to that seen with ventilation, but less prominent, without a segmental type defect. IMPRESSION: 1. No segmental type profusion defect is seen to suggest a pulmonary thromboembolism. 2. Patchy bilateral areas of nonsegmental relative decreased  ventilation and perfusion. This suggests small airways disease. Electronically Signed   By: Lajean Manes M.D.   On: 06/22/2016 18:12    MDM  Took patient care handoff report from Armstead Peaks, PA-C.    Hemoglobin value appears to be consistent with previous values. Small increase in creatinine may be due to minor dehydration. Upon my evaluation, patient's SPO2 would drop to 87-88% on room air with the slightest provocation. She has to take a few minutes of conscious effort to recover. Even at rest, patient will drop SPO2 to 90-91%. VQ scan results make PE unlikely. Bacteria on UA noted. Pt states she has no urinary complaints.  Working diagnosis of possibly viral bronchitis vs influenza. Admission for continued shortness of breath, SOB on exertion, and weakness. Requiring assistance with ambulation.   7:41 PM Spoke with Dr. Eulas Post, who agreed to admit the patient to telemetry observation.    Vitals:   06/22/16 1139 06/22/16 1200 06/22/16 1346 06/22/16 1542  BP:  (!) 130/54 (!) 131/51 116/92  Pulse: 92 86 91 92  Resp: 26 22 18 16   Temp:  99.4 F (37.4 C)    TempSrc:  Oral    SpO2: 100% 100% 95% 97%   Vitals:   06/22/16 1944 06/22/16 2003 06/22/16 2207 06/22/16 2300  BP:  114/58 (!) 128/46 (!) 90/56  Pulse:  95 82 85  Resp:  22 17 18   Temp:   99.8 F (37.7 C) 99 F (37.2 C)  TempSrc:   Oral Oral  SpO2: 95% 90% 98% 94%  Weight:   90.8 kg   Height:   5\' 5"  (1.651 m)        Lorayne Bender, PA-C 06/23/16 0106

## 2016-06-22 NOTE — ED Triage Notes (Signed)
Per EMS-was being seen at PCP for cough/weakness-was walking in hallways a had a coughing spell-vagled down-episode lasted 30 seconds-sent here for possible PNA

## 2016-06-22 NOTE — ED Notes (Signed)
Pt transferred to NM by Rad Tech at this time.

## 2016-06-23 DIAGNOSIS — I5032 Chronic diastolic (congestive) heart failure: Secondary | ICD-10-CM | POA: Diagnosis not present

## 2016-06-23 DIAGNOSIS — E1122 Type 2 diabetes mellitus with diabetic chronic kidney disease: Secondary | ICD-10-CM | POA: Diagnosis not present

## 2016-06-23 DIAGNOSIS — J101 Influenza due to other identified influenza virus with other respiratory manifestations: Secondary | ICD-10-CM | POA: Diagnosis not present

## 2016-06-23 DIAGNOSIS — R55 Syncope and collapse: Secondary | ICD-10-CM

## 2016-06-23 DIAGNOSIS — J208 Acute bronchitis due to other specified organisms: Secondary | ICD-10-CM | POA: Diagnosis not present

## 2016-06-23 DIAGNOSIS — Z794 Long term (current) use of insulin: Secondary | ICD-10-CM | POA: Diagnosis not present

## 2016-06-23 DIAGNOSIS — N183 Chronic kidney disease, stage 3 (moderate): Secondary | ICD-10-CM | POA: Diagnosis not present

## 2016-06-23 LAB — CBC
HCT: 28.8 % — ABNORMAL LOW (ref 36.0–46.0)
Hemoglobin: 9.6 g/dL — ABNORMAL LOW (ref 12.0–15.0)
MCH: 31.3 pg (ref 26.0–34.0)
MCHC: 33.3 g/dL (ref 30.0–36.0)
MCV: 93.8 fL (ref 78.0–100.0)
Platelets: 110 10*3/uL — ABNORMAL LOW (ref 150–400)
RBC: 3.07 MIL/uL — ABNORMAL LOW (ref 3.87–5.11)
RDW: 14.3 % (ref 11.5–15.5)
WBC: 8 10*3/uL (ref 4.0–10.5)

## 2016-06-23 LAB — BASIC METABOLIC PANEL
Anion gap: 6 (ref 5–15)
BUN: 40 mg/dL — ABNORMAL HIGH (ref 6–20)
CO2: 31 mmol/L (ref 22–32)
Calcium: 8.5 mg/dL — ABNORMAL LOW (ref 8.9–10.3)
Chloride: 98 mmol/L — ABNORMAL LOW (ref 101–111)
Creatinine, Ser: 1.78 mg/dL — ABNORMAL HIGH (ref 0.44–1.00)
GFR calc Af Amer: 32 mL/min — ABNORMAL LOW (ref >60)
GFR calc non Af Amer: 28 mL/min — ABNORMAL LOW (ref >60)
Glucose, Bld: 198 mg/dL — ABNORMAL HIGH (ref 65–99)
Potassium: 3.6 mmol/L (ref 3.5–5.1)
Sodium: 135 mmol/L (ref 135–145)

## 2016-06-23 LAB — GLUCOSE, CAPILLARY
Glucose-Capillary: 257 mg/dL — ABNORMAL HIGH (ref 65–99)
Glucose-Capillary: 287 mg/dL — ABNORMAL HIGH (ref 65–99)
Glucose-Capillary: 215 mg/dL — ABNORMAL HIGH (ref 65–99)
Glucose-Capillary: 173 mg/dL — ABNORMAL HIGH (ref 65–99)

## 2016-06-23 LAB — TROPONIN I: Troponin I: <0.03 ng/mL (ref <0.03)

## 2016-06-23 MED ORDER — HEPARIN SODIUM (PORCINE) 5000 UNIT/ML IJ SOLN
5000.0000 [IU] | Freq: Three times a day (TID) | INTRAMUSCULAR | Status: DC
  Filled 2016-06-23 (×2): qty 1

## 2016-06-23 MED ORDER — POTASSIUM CHLORIDE CRYS ER 20 MEQ PO TBCR
40.0000 meq | EXTENDED_RELEASE_TABLET | Freq: Once | ORAL | Status: AC
  Administered 2016-06-23: 40 meq via ORAL
  Filled 2016-06-23: qty 2

## 2016-06-23 MED ORDER — FUROSEMIDE 40 MG PO TABS
40.0000 mg | ORAL_TABLET | Freq: Two times a day (BID) | ORAL | Status: DC
  Administered 2016-06-23 – 2016-06-24 (×2): 40 mg via ORAL
  Filled 2016-06-23 (×2): qty 1

## 2016-06-23 MED ORDER — PREDNISONE 20 MG PO TABS
40.0000 mg | ORAL_TABLET | Freq: Every day | ORAL | Status: DC
  Administered 2016-06-23 – 2016-06-24 (×2): 40 mg via ORAL
  Filled 2016-06-23 (×2): qty 2

## 2016-06-23 NOTE — Progress Notes (Signed)
I assumed care of this patient at 1500.  I agree with the previous nurses assessment.

## 2016-06-23 NOTE — Care Management Obs Status (Signed)
[facility] NOTIFICATION   Patient Details  Name: Hannah [name] MRN: [id number] Date of Birth: 1946-03-05   Medicare Observation Status Notification Given:  Yes    Dellie Catholic, RN 06/23/2016, 5:12 PM

## 2016-06-23 NOTE — Progress Notes (Signed)
Patient had syncopal episode tonight while sitting on the toilet. She states she started to feel lightheaded while walking to the bathroom. While on the toilet she complained of a headache that increased in intensity when she attempted to lift her head. Vitals were taken. Once in bed patient became stable. MD was notified

## 2016-06-23 NOTE — Progress Notes (Signed)
PROGRESS NOTE  Hannah [name]  FUX:[id number] DOB: [birth year] DOA: 06/22/2016 PCP: Nicola Girt, DO  Outpatient Specialists: Truitt Merle, Cardiology Dr. Lamonte Sakai, Pulmonology Rad-Onc, Dr. Sondra Come Dr. Lindi Adie, Heme/Onc  Brief Narrative: Hannah [name] is a 71 y.o. woman with multiple comorbidities including CAD s/p CABG x 2 and bioprosthetic AVR, T2DM (uses a disposable insulin pump; changed qHS), paroxysmal atrial fibrillation (CHADS-Vasc score of 7, she is not anticoagulated), moderate persistent asthma, CKD 3, compensated diastolic CHF, HTN, and breast cancer (s/p chemotherapy, currently undergoing radiation therapy M-F, Dr. Sondra Come) who was sent to the ED by her PCP where she had a post-tussive syncopal event. She had sudden onset severe URI symptoms and wheezing starting the prior night. On arrival, ECG showed NSR, CXR negative, V/Q scan low probability for PE, Creatinine slightly up and Hgb at baseline with mild thrombocytopenia. She desaturated to 87% while ambulating on room air. She was admitted for acute bronchitis and observation of syncope. Flu swab was positive and tamiflu started 1/12. She had another episode of loss of postural tone while on the toilet overnight without reported post-ictal period.   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  - Wean oxygen as able - Continue prednisone x5 days - Continue tamiflu x5 days - DC azithromycin with neg CXR and +flu - Bronchodilators - Incentive  spirometry, flutter valve prn  Syncope: Recurrent in the setting of acute illness. 2 recent episodes: one post-tussive at PCP's office and one while hospitalized 1/12 on the toilet. Quick on/off not consistent with seizure. Telemetry monitoring at time of episode 1/12 shows nonsustained atrial tachycardia. Has had tachypalpitations noted since 2014. - Orthostatic vital signs - Up with assistance - Continue telemetry - Follow up with cardiology. She's had heart monitoring as an outpatient showing atrial tachycardia without these symptoms when not ill. Event monitor in 8/16 showed atrial tachycardia versus atypical atrial flutter.Toprol XL was increased to 100 mg bid. She saw Dr Lovena Le who thought that she had short runs of atrial tachycardia and not atrial flutter.Therefore, she has not been anticoagulated.   CKD: Relatively stable Scr at 1.70 on admission. Baseline creatinine 1.4 - 1.6.  - Monitor BMP  HTN, atrial arrhythmias: Has h/o amiodarone toxicity - Continue beta blocker  CAD, diastolic CHF: No ACS, troponins neg x3, appears euvolemic. BNP 257. EDW thought to be 198lbs.  - Continue lasix 40mg  BID (takes 80mg  / 40mg  BID at home), ASA, plavix, statin, beta blocker  IDDM: Continue disposable insulin pump qHS for now.  Pharmacy notified. 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  - PPI  Breast cancer: s/p resection, chemo. Ongoing XRT. --Would need to notify rad onc if she is still here on Monday  DVT prophylaxis: Heparin Code Status: Full Family Communication: Husband at bedside Disposition Plan: Continue observation for management of flu cause asthma exacerbation and acute respiratory failure.  Consultants:   None  Procedures: None performed during this hospitalization.  Echo Study Conclusions from 03/2016  - Left ventricle: The cavity size  was normal. There was moderate focal basal and mild concentric hypertrophy. Systolic function was normal. The estimated ejection fraction was in the range of 60% to 65%. Images were inadequate for LV wall motion assessment. Features are consistent with a pseudonormal left ventricular filling pattern, with concomitant abnormal relaxation and increased filling pressure (grade 2 diastolic dysfunction). Doppler parameters are consistent with high ventricular filling pressure. - Aortic valve: A bioprosthesis was present and functioning normally. Mean gradient (S): 17 mm Hg. - Mitral valve: Moderately calcified annulus. - Pulmonary arteries: PA peak pressure: 38 mm Hg (S).  Impressions:  - The right ventricular systolic pressure was increased consistent with mild pulmonary hypertension.   MYOVIEW FINDINGS FROM 06/2013: Normal resting EKG. Slight ST depressions noted in aVL after administration of lexiscan. Mild shortness of breath and nausea resolved after the test. EKG is nondiagnostic for ischemia. TID ratio 1.05. Lung-heart ratio 0.43. Normal ventricular chamber size.  IMPRESSION: No evidence for ischemia. Normal wall motion. LVEF 72%.  Antimicrobials:  Azithromycin 1/12  Tamiflu 1/12 > last dose 1/17 AM.  Subjective: Pt feels "much better" this morning. Breathing better. Had syncopal episode overnight on the toilet without preceding symptoms but has felt light-headed when standing. Says she's had episodes like that during acute illnesses for years.   Objective: Vitals:   06/22/16 2300 06/23/16 0656 06/23/16 0923 06/23/16 1156  BP: (!) 90/56 (!) 125/54    Pulse: 85 70    Resp: 18 18    Temp: 99 F (37.2 C) 97.8 F (36.6 C)    TempSrc: Oral Oral    SpO2: 94% 98% 98% 98%  Weight:      Height:        Intake/Output Summary (Last 24 hours) at 06/23/16 1228 Last data filed at 06/23/16 0700  Gross per 24 hour  Intake                0 ml  Output                 2 ml  Net               -2 ml   Filed Weights   06/22/16 2207  Weight: 90.8 kg (200 lb 2.8 oz)    Examination: General exam: 71 y.o. female in no distress Respiratory system: Non-labored breathing. Clear to auscultation bilaterally.  Cardiovascular system: Regular rate and rhythm. No murmur, rub, or gallop. No JVD, and trace pedal edema. + carotid bruits. Gastrointestinal system: Abdomen soft, non-tender, non-distended, with normoactive bowel sounds. No organomegaly or masses felt. Central nervous system: Alert and oriented. No focal neurological deficits. Extremities: Warm, no deformities Skin: No rashes, lesions no ulcers Psychiatry: Judgement and insight appear normal. Mood & affect appropriate.   Data Reviewed: I have personally reviewed following labs and imaging studies  CBC:  Recent Labs Lab 06/22/16 1234 06/23/16 0141  WBC 6.2 8.0  NEUTROABS 5.1  --   HGB 10.5* 9.6*  HCT 32.8* 28.8*  MCV 94.0 93.8  PLT 126* 357*   Basic Metabolic Panel:  Recent Labs Lab 06/22/16 1234 06/23/16 0141  NA 135 135  K 3.9 3.6  CL 94* 98*  CO2 31 31  GLUCOSE 221* 198*  BUN 33* 40*  CREATININE 1.70* 1.78*  CALCIUM 9.0 8.5*   GFR: Estimated Creatinine Clearance: 32.7 mL/min (by C-G formula  based on SCr of 1.78 mg/dL (H)). Liver Function Tests: No results for input(s): AST, ALT, ALKPHOS, BILITOT, PROT, ALBUMIN in the last 168 hours. No results for input(s): LIPASE, AMYLASE in the last 168 hours. No results for input(s): AMMONIA in the last 168 hours. Coagulation Profile: No results for input(s): INR, PROTIME in the last 168 hours. Cardiac Enzymes:  Recent Labs Lab 06/22/16 2109 06/23/16 0141  TROPONINI <0.03 <0.03   BNP (last 3 results) No results for input(s): PROBNP in the last 8760 hours. HbA1C: No results for input(s): HGBA1C in the last 72 hours. CBG:  Recent Labs Lab 06/22/16 1135 06/22/16 2234 06/23/16 0758 06/23/16 1206  GLUCAP 211* 186*  257* 287*   Lipid Profile: No results for input(s): CHOL, HDL, LDLCALC, TRIG, CHOLHDL, LDLDIRECT in the last 72 hours. Thyroid Function Tests: No results for input(s): TSH, T4TOTAL, FREET4, T3FREE, THYROIDAB in the last 72 hours. Anemia Panel: No results for input(s): VITAMINB12, FOLATE, FERRITIN, TIBC, IRON, RETICCTPCT in the last 72 hours. Urine analysis:    Component Value Date/Time   COLORURINE YELLOW 06/22/2016 1207   APPEARANCEUR HAZY (A) 06/22/2016 1207   LABSPEC 1.012 06/22/2016 1207   LABSPEC 1.020 02/24/2016 1119   PHURINE 6.0 06/22/2016 1207   GLUCOSEU 50 (A) 06/22/2016 1207   GLUCOSEU Negative 02/24/2016 1119   HGBUR SMALL (A) 06/22/2016 1207   BILIRUBINUR NEGATIVE 06/22/2016 1207   BILIRUBINUR Negative 02/24/2016 1119   KETONESUR NEGATIVE 06/22/2016 1207   PROTEINUR 30 (A) 06/22/2016 1207   UROBILINOGEN 0.2 02/24/2016 1119   NITRITE NEGATIVE 06/22/2016 1207   LEUKOCYTESUR NEGATIVE 06/22/2016 1207   LEUKOCYTESUR Trace 02/24/2016 1119   Sepsis Labs: @LABRCNTIP (procalcitonin:4,lacticidven:4)  )No results found for this or any previous visit (from the past 240 hour(s)).   Radiology Studies: Dg Chest 2 View  Result Date: 06/22/2016 CLINICAL DATA:  Cough, shortness of breath, chest tightness for the past 2 days. Syncopal episode. EXAM: CHEST  2 VIEW COMPARISON:  04/25/2016. FINDINGS: Normal sized heart with an interval decrease in size. Stable post CABG changes and prosthetic aortic valve. Stable left subclavian porta catheter. Clear lungs with normal vascularity. Mild thoracic spine degenerative changes. Aortic calcifications. Old right humeral neck fracture and avascular necrosis of the right humeral head. IMPRESSION: No acute abnormality.  Aortic atherosclerosis. Electronically Signed   By: Claudie Revering M.D.   On: 06/22/2016 12:20   Nm Pulmonary Perf And Vent  Result Date: 06/22/2016 CLINICAL DATA:  Chest tightness with the breast. Cough and fever. History of breast  carcinoma. EXAM: NUCLEAR MEDICINE VENTILATION - PERFUSION LUNG SCAN TECHNIQUE: Ventilation images were obtained in multiple projections using inhaled aerosol Tc-39m DTPA. Perfusion images were obtained in multiple projections after intravenous injection of Tc-25m MAA. RADIOPHARMACEUTICALS:  31.0 mCi Technetium-61m DTPA aerosol inhalation and 4.1 mCi Technetium-35m MAA IV COMPARISON:  Current chest radiograph. Previous ventilation-perfusion study dated 04/09/2016. FINDINGS: Ventilation: There is heterogeneous radiotracer accumulation in the lungs without a discrete segmental appearing defect. Perfusion: There is also heterogeneous radiotracer accumulation to the lungs on profusion imaging similar to that seen with ventilation, but less prominent, without a segmental type defect. IMPRESSION: 1. No segmental type profusion defect is seen to suggest a pulmonary thromboembolism. 2. Patchy bilateral areas of nonsegmental relative decreased ventilation and perfusion. This suggests small airways disease. Electronically Signed   By: Lajean Manes M.D.   On: 06/22/2016 18:12    Scheduled Meds: . aspirin EC  81 mg Oral Daily  . azithromycin  500 mg Oral Q24H  .  clopidogrel  75 mg Oral Daily  . ezetimibe  10 mg Oral Daily  . gabapentin  300 mg Oral TID  . insulin aspart  0-20 Units Subcutaneous TID WC  . insulin aspart  0-5 Units Subcutaneous QHS  . insulin pump   Subcutaneous TID AC, HS, 0200  . ipratropium-albuterol  3 mL Nebulization QID  . loratadine  10 mg Oral Daily  . metoprolol succinate  100 mg Oral BID  . oseltamivir  30 mg Oral BID  . predniSONE  40 mg Oral Q breakfast  . rosuvastatin  40 mg Oral Daily  . sodium chloride flush  3 mL Intravenous Q12H   Continuous Infusions:   LOS: 0 days   Time spent: 25 minutes.  Vance Gather, MD [facility] Pager [phone]  If 7PM-7AM, please contact night-coverage [email or web address] Password [facility] 06/23/2016, 12:28 PM

## 2016-06-24 DIAGNOSIS — J208 Acute bronchitis due to other specified organisms: Secondary | ICD-10-CM

## 2016-06-24 DIAGNOSIS — J101 Influenza due to other identified influenza virus with other respiratory manifestations: Secondary | ICD-10-CM | POA: Diagnosis not present

## 2016-06-24 DIAGNOSIS — E1122 Type 2 diabetes mellitus with diabetic chronic kidney disease: Secondary | ICD-10-CM | POA: Diagnosis not present

## 2016-06-24 DIAGNOSIS — R55 Syncope and collapse: Secondary | ICD-10-CM | POA: Diagnosis not present

## 2016-06-24 LAB — CBC
HCT: 31.9 % — ABNORMAL LOW (ref 36.0–46.0)
Hemoglobin: 10.1 g/dL — ABNORMAL LOW (ref 12.0–15.0)
MCH: 29.4 pg (ref 26.0–34.0)
MCHC: 31.7 g/dL (ref 30.0–36.0)
MCV: 92.7 fL (ref 78.0–100.0)
Platelets: 128 10*3/uL — ABNORMAL LOW (ref 150–400)
RBC: 3.44 MIL/uL — ABNORMAL LOW (ref 3.87–5.11)
RDW: 13.7 % (ref 11.5–15.5)
WBC: 8.2 10*3/uL (ref 4.0–10.5)

## 2016-06-24 LAB — BASIC METABOLIC PANEL
Anion gap: 10 (ref 5–15)
BUN: 49 mg/dL — ABNORMAL HIGH (ref 6–20)
CO2: 27 mmol/L (ref 22–32)
Calcium: 8.8 mg/dL — ABNORMAL LOW (ref 8.9–10.3)
Chloride: 98 mmol/L — ABNORMAL LOW (ref 101–111)
Creatinine, Ser: 1.71 mg/dL — ABNORMAL HIGH (ref 0.44–1.00)
GFR calc Af Amer: 34 mL/min — ABNORMAL LOW (ref >60)
GFR calc non Af Amer: 29 mL/min — ABNORMAL LOW (ref >60)
Glucose, Bld: 290 mg/dL — ABNORMAL HIGH (ref 65–99)
Potassium: 4.7 mmol/L (ref 3.5–5.1)
Sodium: 135 mmol/L (ref 135–145)

## 2016-06-24 LAB — GLUCOSE, CAPILLARY
Glucose-Capillary: 238 mg/dL — ABNORMAL HIGH (ref 65–99)
Glucose-Capillary: 253 mg/dL — ABNORMAL HIGH (ref 65–99)

## 2016-06-24 MED ORDER — IPRATROPIUM-ALBUTEROL 0.5-2.5 (3) MG/3ML IN SOLN: 3.0000 mL | Freq: Four times a day (QID) | RESPIRATORY_TRACT | 0 refills | Status: DC

## 2016-06-24 MED ORDER — PREDNISONE 20 MG PO TABS: 40.0000 mg | ORAL_TABLET | Freq: Every day | ORAL | 0 refills | Status: DC

## 2016-06-24 MED ORDER — OSELTAMIVIR PHOSPHATE 30 MG PO CAPS: 30.0000 mg | ORAL_CAPSULE | Freq: Two times a day (BID) | ORAL | 0 refills | Status: DC

## 2016-06-24 NOTE — Discharge Summary (Signed)
Physician Discharge Summary  Hannah [name] CMK:[id number] DOB: 10/30/1945 DOA: 06/22/2016  PCP: Nicola Girt, DO  Admit date: 06/22/2016 Discharge date: 06/24/2016  Admitted From: Home Disposition: Home   Recommendations for Outpatient Follow-up:  1. Follow up with PCP in 1-2 weeks. Pt likes her current PCP but would like to establish care with provider within [facility] network who can access records from the rest of her care team.  2. Follow up is scheduled with cardiology. Pt had episode of syncope in the setting of flu with atrial tachycardia on telemetry monitoring.  3. Follow up is scheduled with endocrinology. Pt had hyperglycemia transiently as insulin pump ran out and continues to have elevated CBGs from baseline due to steroids.  4. Please follow up on the following pending results: urine culture & sensitivities. Grew >100k E. coli though patient had no symptoms. Given her history of reactions to several antibiotics, she was counseled to report any symptoms and receive treatment in that case.   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 Sondra Come) who was sent to the ED by her PCP where she had a post-tussive syncopal event. She had sudden onset severe URI symptoms and wheezing starting the prior night. On arrival, ECG showed NSR, CXR negative, V/Q scan low probability for PE, Creatinine slightly up and Hgb at baseline with mild thrombocytopenia. She desaturated to 87% while ambulating on room  air. She was admitted for acute bronchitis and observation of syncope. Flu swab was positive and tamiflu started 1/12. She had another episode of loss of postural tone while on the toilet overnight without reported post-ictal period. Telemetry monitoring showed atrial tachycardia. On 1/13 the patient appeared significantly improved per her husband and was felt to be clinically stable. She was observed for 24 hours and had no further episodes. Fever curve has remained flat/downtrending and respiratory status has improved with steroids, bronchodilators and tamiflu.   Discharge Diagnoses:  Principal Problem:   Acute bronchitis Active Problems:   Essential hypertension   Atrial fibrillation (HCC)   Asthma   DYSPNEA   Cough   CHF (congestive heart failure) (HCC)   Renal insufficiency, mild   Diabetes mellitus, type 2 (HCC)   S/P aortic valve replacement   CAD (coronary artery disease)   Breast cancer of upper-inner quadrant of right female breast ([place])   Port catheter in place   Hypoxia   Influenza A   Syncope  Acute hypoxia secondary to influenza A causing mild exacerbation of her moderate persistent asthma.  - Off oxygen x24 hours - Continue prednisone x5 days - Continue tamiflu x5 days - DC'ed azithromycin with neg CXR and +flu - Bronchodilators - neb therapy prescribed for home - Incentive spirometry, flutter valve prn   Syncope: Recurrent in the setting of acute illness. 2 recent episodes: one post-tussive at PCP's office and one while hospitalized 1/12 on the toilet. Quick on/off not consistent with seizure. Telemetry monitoring at time of episode 1/12 shows nonsustained atrial tachycardia. Has had tachypalpitations noted since 2014. - Orthostatic vital signs were negative on 1/13.  - Follow up with cardiology. She's had  heart monitoring as an outpatient showing atrial tachycardia without these symptoms when not ill. Event monitor in 8/16 showed atrial tachycardia versus atypical  atrial flutter.Toprol XL was increased to 100 mg bid. She saw Dr Lovena Le who thought that she had short runs of atrial tachycardia and not atrial flutter.Therefore, she has not been anticoagulated.   CKD: Relatively stable Scr at 1.70 on admission. Baseline creatinine 1.4 - 1.6.  - Monitor BMP  HTN, atrial arrhythmias: Has h/o amiodarone toxicity - Continue beta blocker  CAD, diastolic CHF: No ACS, troponins neg x3, appears euvolemic. BNP 257. EDW thought to be 198lbs.  - Continue lasix 66m BID (takes 845m/ 4051mID at home), ASA, plavix, statin, beta blocker  IDDM: Continue disposable insulin pump qHS. Pharmacy notified. HbA1c 6.4% on 05/08/2016. 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  - PPI  Breast cancer: s/p resection, chemo. Ongoing XRT. - Discussed with Dr. KinSondra Comeo will cancel XRT for 1/15 and the patient will call in the afternoon to reschedule.  Discharge Instructions Discharge Instructions    Discharge instructions    Complete by:  As directed    You were admitted with the flu which caused acute bronchitis and 2 episodes of syncope (passing out/fainting). The flu and bronchitis have been treated and syncope has resolved so you are stable for discharge with the following recommendations:  - Continue taking tamiflu twice a day until you run out of medications. You have 6 doses remaining, starting tonight.  - Continue taking prednisone 18m29m the morning for the next 2 days starting tomorrow.  - Continue using duoneb treatments at home as needed for wheezing or shortness of braeth. A prescription has been provided.  - Continue other medications as you were.  - Follow up with cardiology and endocrinology as scheduled.  - I've spoken with Dr. KinaSondra Come recommends holding off on radiation tomorrow. This has been cancelled. He would like you to call the nurses  station tomorrow afternoon to discuss ongoing care tomorrow afternoon at [phone].  You also had evidence of a urinary tract infection, though in the absence of symptoms we do not treat this. Therefore, please let any medical provider know if you have symptoms including burning with urination, abdominal pain, fever, or frequent/urgent urination above what you normally have with lasix.   If you have another episode of syncope or difficulty breathing, seek medical attention.     Allergies as of 06/24/2016      Reactions   Amoxicillin Rash   Has patient had a PCN reaction causing immediate rash, facial/tongue/throat swelling, SOB or lightheadedness with hypotension: No Has patient had a PCN reaction causing severe rash involving mucus membranes or skin necrosis: Yes Has patient had a PCN reaction that required hospitalization No Has patient had a PCN reaction occurring within the last 10 years: No If all of the above answers are "NO", then may proceed with Cephalosporin use.   Imdur [isosorbide Dinitrate] Other (See Comments)   Headache/severe hypotension/Syncope   Latex Itching   Tetracycline Rash      Medication List    TAKE these medications   albuterol 108 (90 Base) MCG/ACT inhaler Commonly known as:  PROVENTIL HFA;VENTOLIN HFA Inhale 2 puffs into the lungs every 6 (six) hours as needed for wheezing or shortness of breath.   alendronate 70 MG tablet Commonly known as:  FOSAMAX Take 70 mg by mouth every  Wednesday. Take with a full glass of water on an empty stomach.   aspirin 81 MG EC tablet Take 1 tablet (81 mg total) by mouth daily.   budesonide-formoterol 160-4.5 MCG/ACT inhaler Commonly known as:  SYMBICORT INHALE TWO PUFFS INTO LUNGS TWICE DAILY   clopidogrel 75 MG tablet Commonly known as:  PLAVIX Take 75 mg by mouth daily.   furosemide 80 MG tablet Commonly known as:  LASIX Take 1 tablet (80 mg total) by mouth daily. 1 tablet in the AM and 1/2 tablet (40 mg) in  the early afternoon. What changed:  how much to take  when to take this  additional instructions   gabapentin 300 MG capsule Commonly known as:  NEURONTIN Take 1 capsule (300 mg total) by mouth 3 (three) times daily.   insulin lispro 100 UNIT/ML KiwkPen Commonly known as:  HUMALOG KWIKPEN INJECT 10 UNITS INTO THE SKIN WITH MEALS PLUS SLIDING SCALE What changed:  how much to take  how to take this  when to take this  reasons to take this  additional instructions   insulin lispro 100 UNIT/ML injection Commonly known as:  HUMALOG Inject 76 units daily with  V-GO pump What changed:  Another medication with the same name was changed. Make sure you understand how and when to take each.   ipratropium-albuterol 0.5-2.5 (3) MG/3ML Soln Commonly known as:  DUONEB Take 3 mLs by nebulization 4 (four) times daily.   lidocaine-prilocaine cream Commonly known as:  EMLA Apply to affected area once What changed:  how much to take  how to take this  when to take this  reasons to take this  additional instructions   loratadine 10 MG tablet Commonly known as:  CLARITIN Take 10 mg by mouth daily.   metoprolol succinate 100 MG 24 hr tablet Commonly known as:  TOPROL-XL Take 1 tablet (100 mg total) by mouth 2 (two) times daily. Take with or immediately following a meal.   omeprazole 20 MG tablet Commonly known as:  PRILOSEC OTC Take 20 mg by mouth daily as needed (heartburn).   oseltamivir 30 MG capsule Commonly known as:  TAMIFLU Take 1 capsule (30 mg total) by mouth 2 (two) times daily.   potassium chloride SA 20 MEQ tablet Commonly known as:  KLOR-CON M20 Take 1 tablet (20 mEq total) by mouth 2 (two) times daily. What changed:  when to take this  reasons to take this   predniSONE 20 MG tablet Commonly known as:  DELTASONE Take 2 tablets (40 mg total) by mouth daily with breakfast. Start taking on:  06/25/2016   rosuvastatin 40 MG tablet Commonly known as:   CRESTOR TAKE ONE TABLET BY MOUTH ONCE DAILY What changed:  See the new instructions.   TraMADol HCl 200 MG Cp24 Take 200 mg by mouth daily as needed (for back pain).   TRULICITY 3.29 JM/4.2AS Sopn Generic drug:  Dulaglutide Inject 0.75 mg into the skin every Wednesday.   V-GO 40 Kit USE 1 PER DAY   ZETIA 10 MG tablet Generic drug:  ezetimibe Take 1 tablet by mouth  daily What changed:  See the new instructions.       Allergies  Allergen Reactions  . Amoxicillin Rash    Has patient had a PCN reaction causing immediate rash, facial/tongue/throat swelling, SOB or lightheadedness with hypotension: No Has patient had a PCN reaction causing severe rash involving mucus membranes or skin necrosis: Yes Has patient had a PCN reaction that required hospitalization No  Has patient had a PCN reaction occurring within the last 10 years: No If all of the above answers are "NO", then may proceed with Cephalosporin use.   . Imdur [Isosorbide Dinitrate] Other (See Comments)    Headache/severe hypotension/Syncope  . Latex Itching  . Tetracycline Rash   Consultations:  None  Procedures/Studies: Dg Chest 2 View  Result Date: 06/22/2016 CLINICAL DATA:  Cough, shortness of breath, chest tightness for the past 2 days. Syncopal episode. EXAM: CHEST  2 VIEW COMPARISON:  04/25/2016. FINDINGS: Normal sized heart with an interval decrease in size. Stable post CABG changes and prosthetic aortic valve. Stable left subclavian porta catheter. Clear lungs with normal vascularity. Mild thoracic spine degenerative changes. Aortic calcifications. Old right humeral neck fracture and avascular necrosis of the right humeral head. IMPRESSION: No acute abnormality.  Aortic atherosclerosis. Electronically Signed   By: Claudie Revering M.D.   On: 06/22/2016 12:20   Nm Pulmonary Perf And Vent  Result Date: 06/22/2016 CLINICAL DATA:  Chest tightness with the breast. Cough and fever. History of breast carcinoma. EXAM:  NUCLEAR MEDICINE VENTILATION - PERFUSION LUNG SCAN TECHNIQUE: Ventilation images were obtained in multiple projections using inhaled aerosol Tc-4mDTPA. Perfusion images were obtained in multiple projections after intravenous injection of Tc-930mAA. RADIOPHARMACEUTICALS:  31.0 mCi Technetium-9973mPA aerosol inhalation and 4.1 mCi Technetium-25m57m IV COMPARISON:  Current chest radiograph. Previous ventilation-perfusion study dated 04/09/2016. FINDINGS: Ventilation: There is heterogeneous radiotracer accumulation in the lungs without a discrete segmental appearing defect. Perfusion: There is also heterogeneous radiotracer accumulation to the lungs on profusion imaging similar to that seen with ventilation, but less prominent, without a segmental type defect. IMPRESSION: 1. No segmental type profusion defect is seen to suggest a pulmonary thromboembolism. 2. Patchy bilateral areas of nonsegmental relative decreased ventilation and perfusion. This suggests small airways disease. Electronically Signed   By: DaviLajean Manes.   On: 06/22/2016 18:12    Subjective: Pt feeling better, back to baseline with occasional coughing without dyspnea. Breathing treatment very helpful about 1 hr prior to visit this morning. No further syncopal episodes or light-headedness. No fevers since admission.    Discharge Exam: Vitals:   06/23/16 2032 06/24/16 0626  BP: (!) 142/39 (!) 137/54  Pulse: 77 100  Resp: 16 18  Temp: 97.6 F (36.4 C) 97.9 F (36.6 C)   Vitals:   06/23/16 2011 06/23/16 2032 06/24/16 0626 06/24/16 0825  BP:  (!) 142/39 (!) 137/54   Pulse:  77 100   Resp:  16 18   Temp:  97.6 F (36.4 C) 97.9 F (36.6 C)   TempSrc:  Oral Oral   SpO2: 93% 96% 100% 100%  Weight:   89.4 kg (197 lb 1.5 oz)   Height:       General: Pt is alert, awake, not in acute distress Cardiovascular: RRR, S1/S2 +, no rubs, no gallops Respiratory: Nonlabored on room air, scattered end-expiratory wheezing and  rhonchi. Abdominal: Soft, NT, ND, bowel sounds + Extremities: no edema, no cyanosis  Labs: BNP (last 3 results)  Recent Labs  05/08/16 1144 05/23/16 1143 06/22/16 2109  BNP 209.4* 243.3* 257.786.7Basic Metabolic Panel:  Recent Labs Lab 06/22/16 1234 06/23/16 0141 06/24/16 0520  NA 135 135 135  K 3.9 3.6 4.7  CL 94* 98* 98*  CO2 31 31 27   GLUCOSE 221* 198* 290*  BUN 33* 40* 49*  CREATININE 1.70* 1.78* 1.71*  CALCIUM 9.0 8.5* 8.8*   CBC:  Recent Labs  Lab 06/22/16 1234 06/23/16 0141 06/24/16 0520  WBC 6.2 8.0 8.2  NEUTROABS 5.1  --   --   HGB 10.5* 9.6* 10.1*  HCT 32.8* 28.8* 31.9*  MCV 94.0 93.8 92.7  PLT 126* 110* 128*   Cardiac Enzymes:  Recent Labs Lab 06/22/16 2109 06/23/16 0141  TROPONINI <0.03 <0.03   CBG:  Recent Labs Lab 06/23/16 1206 06/23/16 1717 06/23/16 2153 06/24/16 0429 06/24/16 0737  GLUCAP 287* 215* 173* 238* 253*   Urinalysis    Component Value Date/Time   COLORURINE YELLOW 06/22/2016 1207   APPEARANCEUR HAZY (A) 06/22/2016 1207   LABSPEC 1.012 06/22/2016 1207   LABSPEC 1.020 02/24/2016 1119   PHURINE 6.0 06/22/2016 1207   GLUCOSEU 50 (A) 06/22/2016 1207   GLUCOSEU Negative 02/24/2016 1119   HGBUR SMALL (A) 06/22/2016 1207   BILIRUBINUR NEGATIVE 06/22/2016 1207   BILIRUBINUR Negative 02/24/2016 1119   KETONESUR NEGATIVE 06/22/2016 1207   PROTEINUR 30 (A) 06/22/2016 1207   UROBILINOGEN 0.2 02/24/2016 1119   NITRITE NEGATIVE 06/22/2016 1207   LEUKOCYTESUR NEGATIVE 06/22/2016 1207   LEUKOCYTESUR Trace 02/24/2016 1119   Microbiology Recent Results (from the past 240 hour(s))  Urine culture     Status: Abnormal (Preliminary result)   Collection Time: 06/22/16 12:07 PM  Result Value Ref Range Status   Specimen Description URINE, CLEAN CATCH  Final   Special Requests NONE  Final   Culture (A)  Final    >=100,000 COLONIES/mL ESCHERICHIA COLI SUSCEPTIBILITIES TO FOLLOW Performed at [facility]    Report  Status PENDING  Incomplete   Time coordinating discharge: Over 7 minutes  Vance Gather, MD  [facility] 06/24/2016, 10:29 AM Pager [phone]

## 2016-06-25 ENCOUNTER — Ambulatory Visit: Payer: Medicare Other

## 2016-06-25 LAB — URINE CULTURE: Culture: >=100000 — AB

## 2016-06-26 ENCOUNTER — Ambulatory Visit: Payer: Medicare Other

## 2016-06-26 ENCOUNTER — Ambulatory Visit: Payer: Medicare Other | Admitting: Nurse Practitioner

## 2016-06-26 ENCOUNTER — Ambulatory Visit: Payer: Medicare Other | Admitting: Radiation Oncology

## 2016-06-26 ENCOUNTER — Telehealth: Payer: Self-pay | Admitting: Oncology

## 2016-06-26 NOTE — Telephone Encounter (Signed)
Called Hannah [name] to see how she is feeling.  She said she is weak and she is still coughing a lot.  She is worried about exposing other patients to the flu and wants to cancel treatment for tomorrow.  She will finish taking Tamiflu tomorrow and feels more comfortable staring treatment on Thursday.  Notified [facility] of cancellation for tomorrow.

## 2016-06-27 ENCOUNTER — Ambulatory Visit: Payer: Medicare Other

## 2016-06-27 ENCOUNTER — Ambulatory Visit: Payer: Medicare Other | Admitting: Radiation Oncology

## 2016-06-28 ENCOUNTER — Ambulatory Visit: Payer: Medicare Other

## 2016-06-28 ENCOUNTER — Ambulatory Visit: Admission: RE | Admit: 2016-06-28 | Payer: Medicare Other | Source: Ambulatory Visit | Admitting: Radiation Oncology

## 2016-06-28 LAB — CULTURE, BLOOD (ROUTINE X 2)
Culture: NO GROWTH
Culture: NO GROWTH

## 2016-06-29 ENCOUNTER — Ambulatory Visit
Admission: RE | Admit: 2016-06-29 | Discharge: 2016-06-29 | Disposition: A | Discharge status: Home / Self Care | Payer: Medicare Other | Source: Ambulatory Visit | Attending: Radiation Oncology | Admitting: Radiation Oncology

## 2016-06-29 ENCOUNTER — Ambulatory Visit (HOSPITAL_BASED_OUTPATIENT_CLINIC_OR_DEPARTMENT_OTHER): Payer: Medicare Other | Admitting: Nurse Practitioner

## 2016-06-29 ENCOUNTER — Emergency Department (HOSPITAL_COMMUNITY): Payer: Medicare Other

## 2016-06-29 ENCOUNTER — Inpatient Hospital Stay: Admission: RE | Admit: 2016-06-29 | Payer: Self-pay | Source: Ambulatory Visit | Admitting: Radiation Oncology

## 2016-06-29 ENCOUNTER — Encounter: Payer: Self-pay | Admitting: Oncology

## 2016-06-29 ENCOUNTER — Observation Stay (HOSPITAL_COMMUNITY): Payer: Medicare Other

## 2016-06-29 ENCOUNTER — Inpatient Hospital Stay (HOSPITAL_COMMUNITY)
Admission: EM | Admit: 2016-06-29 | Discharge: 2016-07-04 | DRG: 312 | Disposition: A | Discharge status: Home / Self Care | Payer: Medicare Other | Attending: Internal Medicine | Admitting: Internal Medicine

## 2016-06-29 ENCOUNTER — Encounter (HOSPITAL_COMMUNITY): Payer: Self-pay | Admitting: Emergency Medicine

## 2016-06-29 DIAGNOSIS — R569 Unspecified convulsions: Secondary | ICD-10-CM

## 2016-06-29 DIAGNOSIS — Z7983 Long term (current) use of bisphosphonates: Secondary | ICD-10-CM

## 2016-06-29 DIAGNOSIS — Z923 Personal history of irradiation: Secondary | ICD-10-CM

## 2016-06-29 DIAGNOSIS — E119 Type 2 diabetes mellitus without complications: Secondary | ICD-10-CM

## 2016-06-29 DIAGNOSIS — M8580 Other specified disorders of bone density and structure, unspecified site: Secondary | ICD-10-CM | POA: Diagnosis present

## 2016-06-29 DIAGNOSIS — C50211 Malignant neoplasm of upper-inner quadrant of right female breast: Secondary | ICD-10-CM | POA: Diagnosis present

## 2016-06-29 DIAGNOSIS — Z853 Personal history of malignant neoplasm of breast: Secondary | ICD-10-CM

## 2016-06-29 DIAGNOSIS — I130 Hypertensive heart and chronic kidney disease with heart failure and stage 1 through stage 4 chronic kidney disease, or unspecified chronic kidney disease: Secondary | ICD-10-CM | POA: Diagnosis present

## 2016-06-29 DIAGNOSIS — E2740 Unspecified adrenocortical insufficiency: Secondary | ICD-10-CM | POA: Diagnosis present

## 2016-06-29 DIAGNOSIS — H9191 Unspecified hearing loss, right ear: Secondary | ICD-10-CM | POA: Diagnosis present

## 2016-06-29 DIAGNOSIS — I951 Orthostatic hypotension: Principal | ICD-10-CM | POA: Diagnosis present

## 2016-06-29 DIAGNOSIS — D649 Anemia, unspecified: Secondary | ICD-10-CM | POA: Diagnosis present

## 2016-06-29 DIAGNOSIS — I10 Essential (primary) hypertension: Secondary | ICD-10-CM | POA: Diagnosis present

## 2016-06-29 DIAGNOSIS — Z888 Allergy status to other drugs, medicaments and biological substances status: Secondary | ICD-10-CM

## 2016-06-29 DIAGNOSIS — Z8673 Personal history of transient ischemic attack (TIA), and cerebral infarction without residual deficits: Secondary | ICD-10-CM

## 2016-06-29 DIAGNOSIS — Z8249 Family history of ischemic heart disease and other diseases of the circulatory system: Secondary | ICD-10-CM

## 2016-06-29 DIAGNOSIS — E785 Hyperlipidemia, unspecified: Secondary | ICD-10-CM | POA: Diagnosis present

## 2016-06-29 DIAGNOSIS — M542 Cervicalgia: Secondary | ICD-10-CM

## 2016-06-29 DIAGNOSIS — Z881 Allergy status to other antibiotic agents status: Secondary | ICD-10-CM

## 2016-06-29 DIAGNOSIS — Z7902 Long term (current) use of antithrombotics/antiplatelets: Secondary | ICD-10-CM

## 2016-06-29 DIAGNOSIS — T451X5A Adverse effect of antineoplastic and immunosuppressive drugs, initial encounter: Secondary | ICD-10-CM | POA: Diagnosis present

## 2016-06-29 DIAGNOSIS — Z7982 Long term (current) use of aspirin: Secondary | ICD-10-CM

## 2016-06-29 DIAGNOSIS — Z952 Presence of prosthetic heart valve: Secondary | ICD-10-CM

## 2016-06-29 DIAGNOSIS — Z794 Long term (current) use of insulin: Secondary | ICD-10-CM

## 2016-06-29 DIAGNOSIS — Z883 Allergy status to other anti-infective agents status: Secondary | ICD-10-CM

## 2016-06-29 DIAGNOSIS — I5032 Chronic diastolic (congestive) heart failure: Secondary | ICD-10-CM | POA: Diagnosis present

## 2016-06-29 DIAGNOSIS — N183 Chronic kidney disease, stage 3 (moderate): Secondary | ICD-10-CM | POA: Diagnosis present

## 2016-06-29 DIAGNOSIS — Z951 Presence of aortocoronary bypass graft: Secondary | ICD-10-CM

## 2016-06-29 DIAGNOSIS — N184 Chronic kidney disease, stage 4 (severe): Secondary | ICD-10-CM | POA: Diagnosis present

## 2016-06-29 DIAGNOSIS — Z7952 Long term (current) use of systemic steroids: Secondary | ICD-10-CM

## 2016-06-29 DIAGNOSIS — K219 Gastro-esophageal reflux disease without esophagitis: Secondary | ICD-10-CM | POA: Diagnosis present

## 2016-06-29 DIAGNOSIS — I252 Old myocardial infarction: Secondary | ICD-10-CM

## 2016-06-29 DIAGNOSIS — I428 Other cardiomyopathies: Secondary | ICD-10-CM | POA: Diagnosis present

## 2016-06-29 DIAGNOSIS — G620 Drug-induced polyneuropathy: Secondary | ICD-10-CM | POA: Diagnosis present

## 2016-06-29 DIAGNOSIS — I2510 Atherosclerotic heart disease of native coronary artery without angina pectoris: Secondary | ICD-10-CM | POA: Diagnosis present

## 2016-06-29 DIAGNOSIS — I471 Supraventricular tachycardia: Secondary | ICD-10-CM | POA: Diagnosis present

## 2016-06-29 DIAGNOSIS — Z833 Family history of diabetes mellitus: Secondary | ICD-10-CM

## 2016-06-29 DIAGNOSIS — L409 Psoriasis, unspecified: Secondary | ICD-10-CM | POA: Diagnosis present

## 2016-06-29 DIAGNOSIS — I480 Paroxysmal atrial fibrillation: Secondary | ICD-10-CM | POA: Diagnosis present

## 2016-06-29 DIAGNOSIS — E1122 Type 2 diabetes mellitus with diabetic chronic kidney disease: Secondary | ICD-10-CM | POA: Diagnosis present

## 2016-06-29 HISTORY — DX: Unspecified convulsions: R56.9

## 2016-06-29 LAB — BASIC METABOLIC PANEL
Anion gap: 7 (ref 5–15)
BUN: 38 mg/dL — ABNORMAL HIGH (ref 6–20)
CO2: 30 mmol/L (ref 22–32)
Calcium: 9.3 mg/dL (ref 8.9–10.3)
Chloride: 102 mmol/L (ref 101–111)
Creatinine, Ser: 1.53 mg/dL — ABNORMAL HIGH (ref 0.44–1.00)
GFR calc Af Amer: 39 mL/min — ABNORMAL LOW (ref >60)
GFR calc non Af Amer: 33 mL/min — ABNORMAL LOW (ref >60)
Glucose, Bld: 188 mg/dL — ABNORMAL HIGH (ref 65–99)
Potassium: 3.8 mmol/L (ref 3.5–5.1)
Sodium: 139 mmol/L (ref 135–145)

## 2016-06-29 LAB — CBC
HCT: 36.7 % (ref 36.0–46.0)
Hemoglobin: 12.6 g/dL (ref 12.0–15.0)
MCH: 30.7 pg (ref 26.0–34.0)
MCHC: 34.3 g/dL (ref 30.0–36.0)
MCV: 89.5 fL (ref 78.0–100.0)
Platelets: 199 10*3/uL (ref 150–400)
RBC: 4.1 MIL/uL (ref 3.87–5.11)
RDW: 13 % (ref 11.5–15.5)
WBC: 9.6 10*3/uL (ref 4.0–10.5)

## 2016-06-29 LAB — CBG MONITORING, ED
Glucose-Capillary: 206 mg/dL — ABNORMAL HIGH (ref 65–99)
Glucose-Capillary: 180 mg/dL — ABNORMAL HIGH (ref 65–99)

## 2016-06-29 MED ORDER — LORAZEPAM 1 MG PO TABS
1.0000 mg | ORAL_TABLET | Freq: Once | ORAL | Status: AC
  Administered 2016-06-29: 1 mg via ORAL
  Filled 2016-06-29: qty 1

## 2016-06-29 MED ORDER — SODIUM CHLORIDE 0.9% FLUSH
3.0000 mL | Freq: Two times a day (BID) | INTRAVENOUS | Status: DC
  Administered 2016-07-02 – 2016-07-04 (×2): 3 mL via INTRAVENOUS

## 2016-06-29 MED ORDER — GADOBENATE DIMEGLUMINE 529 MG/ML IV SOLN
18.0000 mL | Freq: Once | INTRAVENOUS | Status: AC | PRN
  Administered 2016-06-29: 18 mL via INTRAVENOUS

## 2016-06-29 NOTE — ED Notes (Signed)
Pt ambulated to restroom without difficulty. Stand by assist

## 2016-06-29 NOTE — ED Notes (Signed)
Stopped in to do pt EKG. Pt is in Xray. Will perform EKG when she returns

## 2016-06-29 NOTE — Progress Notes (Signed)
At 12:52, patient's granddaughter came in to the nursing clinic and said "we need some help out here."  She was bringing Hannah [name] to the radiation machine for treatment in a wheelchair. Hannah [name] became weak and started shaking.  She vomited on the way into the nursing clinic.  This episode lasted for about 5 minutes.  The patient reported afterwards that she does not remember what happened.  She did not remember going down the hallway or coming to the nursing clinic.  Patient is diabetic and uses an insulin pump.  Her blood sugar was checked and was 206.  Vitals were 110/62, hr 83 with 97% oxygen on room air.  Patient reports she was discharged from the hospital on Sunday for passing out and having the Flu.  She finished taking tamiflu and prednisone yesterday and said she felt fine this morning.  Discussed with Dr. Isidore Moos who recommended contacting Leland Johns, NP.  Called Hannah [name] and while talking to her, the patient had another episode and was slumped forward in her wheelchair and was unresponsive for about 1 minute.  She was able to state her name and where she was after waking up.  Rapid response was called.  Escorted the patient via stretcher to the ED room 3 along with Hannah Gauss, RN from the Rapid Response team.  Report was given to the ER nurse.

## 2016-06-29 NOTE — H&P (Signed)
History and Physical    HAANI BAKULA XFG:[id number] DOB: [birth year] DOA: 06/29/2016  PCP: Nicola Girt, DO  Patient coming from: Home  Chief Complaint: seizure-like activity   HPI: Hannah [name] is a 71 y.o. female with medical history significant of multiple comorbidities including CAD S/P CABG x 2, Type 2 DM (uses a disposable insulin pump; changed qHS), paroxysmal atrial fibrillation (CHADS-Vasc score of 7, she is not anticoagulated), S/P aortic valve replacement, moderate persistent asthma, CKD 3, compensated diastolic CHF, HTN, and breast cancer (S/P chemotherapy, currently undergoing radiation therapy M-F, Dr. Sondra Come) who presented today after a seizure-like activity at her radiation appointment. She was recently discharged from the hospital after treatment of influenza. She and family state that today while sitting in a wheelchair at her radiation appointment, she became unresponsive. Her eyes were open, glassy, and she was unresponsive to verbal stimuli. She then started having bilateral upper extremity shaking and vomiting afterward. She did not have any tongue biting or urinary incontinence with this episode. She was confused for a few minutes afterwards and did not recall what had happened. Staff at the radiation center checked her blood sugar which was 206, vital signs were stable. She was sent to the ED for further evaluation. She has no other acute complaints. She has generalized weakness, which is slowly improving since hospitalization. She denies any headaches, dizziness, lightheadedness, vision changes, chest pain or shortness of breath, nausea, abdominal pain, diarrhea. She states that she did have a seizure 3 years ago when she was admitted for stroke. She is currently not on any antiepileptics. Family also tell me that while she was admitted, she had a similar episode while on the toilet on 1/12. At the time, it was thought to be syncopal episode  Family states that while in  the ED, patient had another episode of unresponsiveness. There was no shaking associated with this time. She was again confused for a few minutes afterwards.  ED Course: CT head and labs obtained. Case was discussed with neurology, recommended MRI as well as EEG.   Review of Systems: As per HPI otherwise 10 point review of systems negative.   Past Medical History:  Diagnosis Date  . Abnormally small mouth   . Anemia   . Bilateral carotid artery stenosis    Bilateral ICA 40-59%/  >50% LECA  . Breast cancer ([place])   . Breast cancer of upper-inner quadrant of right female breast ([place]) 12/16/2015  . Bruises easily   . CAD (coronary artery disease) cardiologist-  dr Aundra Dubin   Remote CABG in 1998 with redo in 2009  . CHF (congestive heart failure) (HCC)    EF is low normal at 50 to 55% per echo in Jan. 2011  . Dyspnea   . GERD (gastroesophageal reflux disease)   . Hearing loss of right ear   . History of non-ST elevation myocardial infarction (NSTEMI)    Sept 2014--  thought to be type II HTN w/ LHC without infarct related artery and patent grafts  . Hyperlipidemia   . Hypertension   . Iron deficiency anemia   . Moderate persistent asthma    pulmologist-  Dr. Malvin Johns  . Nausea & vomiting    related to stone  . Nonischemic cardiomyopathy ([place])   . PAF (paroxysmal atrial fibrillation) ([place])   . PONV (postoperative nausea and vomiting)    pt states patch worked well before  . Psoriasis    right leg  . Renal calculus, right   .  Renal insufficiency, mild   . S/P AVR    prosthesis valve placement 2009 at same time re-do CABG  . Stroke [facility])    residual rt hearing loss  . Type 2 diabetes mellitus ([place])    monitored by dr Dwyane Dee    Past Surgical History:  Procedure Laterality Date  . AORTIC VALVE REPLACEMENT  2009   [facility] same [facility] is [facility]  . BREAST LUMPECTOMY WITH RADIOACTIVE SEED AND SENTINEL LYMPH NODE BIOPSY Right 01/18/2016   Procedure:  RIGHT BREAST LUMPECTOMY WITH RADIOACTIVE SEED AND SENTINEL LYMPH NODE BIOPSY;  Surgeon: DAlphonsa Overall MD;  [facility] [facility]  Service: General;  Laterality: Right;  . CARDIAC CATHETERIZATION  03/23/2008   Pre-redo CABG: L main OK, LAD (T), CFX (T), OM1 99%, RCA (T), LIMA-LAD OK, SVG-OM(?3) OK w/ little florw to OM2, SVG-RCA OK. EF NL  . CARDIOVASCULAR STRESS TEST  06-15-2013      dr hDebara Pickett  [facility]/  no evidence ischemia/  normal wall motion, ef 72%  . CARPAL TUNNEL RELEASE    . COLONOSCOPY    . CORONARY ARTERY BYPASS GRAFT  1998 &  re-do 2009   Had LIMA to DX/LAD, SVG to 2 marginal branches and [facility]; SVG to 3rd OM and PD at time of redo  . CYSTOSCOPY W/ URETERAL STENT PLACEMENT Right 12/20/2014   Procedure: CYSTOSCOPY WITH RETROGRADE PYELOGRAM/URETERAL STENT PLACEMENT;  Surgeon: PCleon Gustin MD;  [facility]  Service: Urology;  Laterality: Right;  . EYE SURGERY Bilateral    cataracts  . HOLMIUM LASER APPLICATION Right [id number]/[id number]  Procedure:  HOLMIUM LASER LITHOTRIPSY;  Surgeon: PCleon Gustin MD;  [facility]  Service: Urology;  Laterality: Right;  . LEFT HEART CATHETERIZATION WITH CORONARY/GRAFT ANGIOGRAM N/A 02/23/2013   Procedure: LEFT HEART CATHETERIZATION WITH CBeatrix Fetters  Surgeon: MBlane Ohara MD;  [facility];  Service: Cardiovascular;  Laterality: N/A;  . PORTACATH PLACEMENT Left 01/18/2016   Procedure: INSERTION PORT-A-CATH;  Surgeon: DAlphonsa Overall MD;  [place] [facility]  Service: General;  Laterality: Left;  . TONSILLECTOMY    . TRANSTHORACIC ECHOCARDIOGRAM  02-24-2013      mild LVH,  ef 50-55%/  AV bioprosthesis was present with very mild stenosis and no regurg., mean grandient 144mg, peak grandient 2059m /  mild MR/  mild LAE and RAE/  moderate TR  . TUBAL LIGATION       reports that she has never smoked. She has never used smokeless tobacco. She reports that  she does not drink alcohol or use drugs.  Allergies  Allergen Reactions  . Amoxicillin Rash    Has patient had a PCN reaction causing immediate rash, facial/tongue/throat swelling, SOB or lightheadedness with hypotension: No Has patient had a PCN reaction causing severe rash involving mucus membranes or skin necrosis: Yes Has patient had a PCN reaction that required hospitalization No Has patient had a PCN reaction occurring within the last 10 years: No If all of the above answers are "NO", then may proceed with Cephalosporin use.   . Imdur [Isosorbide Dinitrate] Other (See Comments)    Headache/severe hypotension/Syncope  . Latex Itching  . Tetracycline Rash    Family History  Problem Relation Age of Onset  . Heart disease Mother   . Heart disease Father   . Heart failure Father   . Diabetes Maternal Grandmother   . Diabetes Son  Prior to Admission medications   Medication Sig Start Date End Date Taking? Authorizing Provider  albuterol (PROVENTIL HFA;VENTOLIN HFA) 108 (90 Base) MCG/ACT inhaler Inhale 2 puffs into the lungs every 6 (six) hours as needed for wheezing or shortness of breath.   Yes Historical Provider, MD  alendronate (FOSAMAX) 70 MG tablet Take 70 mg by mouth every Wednesday. Take with a full glass of water on an empty stomach.    Yes Historical Provider, MD  aspirin EC 81 MG EC tablet Take 1 tablet (81 mg total) by mouth daily. 06/16/13  Yes Brittainy Erie Noe, PA-C  budesonide-formoterol (SYMBICORT) 160-4.5 MCG/ACT inhaler INHALE TWO PUFFS INTO LUNGS TWICE DAILY 07/21/15  Yes Collene Gobble, MD  clopidogrel (PLAVIX) 75 MG tablet Take 75 mg by mouth daily.   Yes Historical Provider, MD  furosemide (LASIX) 80 MG tablet Take 1 tablet (80 mg total) by mouth daily. 1 tablet in the AM and 1/2 tablet (40 mg) in the early afternoon. Patient taking differently: Take 40-80 mg by mouth 2 (two) times daily. Take 80 mg in the morning and then 15m in the afternoon, if pt is  going to be out during the day the pt takes 438min the am and then 8092mhen she gets back home 05/01/16 07/30/16 Yes LorBurtis JunesP  gabapentin (NEURONTIN) 300 MG capsule Take 1 capsule (300 mg total) by mouth 3 (three) times daily. 03/26/16  Yes VinNicholas LoseD  insulin lispro (HUMALOG KWIKPEN) 100 UNIT/ML KiwkPen INJECT 10 UNITS INTO THE SKIN WITH MEALS PLUS SLIDING SCALE Patient taking differently: Inject 10-12 Units into the skin daily as needed. per SLIDING SCALE 05/10/16  Yes AjaElayne SnareD  insulin lispro (HUMALOG) 100 UNIT/ML injection Inject 76 units daily with  V-GO pump 05/10/16  Yes AjaElayne SnareD  ipratropium-albuterol (DUONEB) 0.5-2.5 (3) MG/3ML SOLN Take 3 mLs by nebulization 4 (four) times daily. 06/24/16 07/25/16 Yes RyaPatrecia PourD  loratadine (CLARITIN) 10 MG tablet Take 10 mg by mouth daily.   Yes Historical Provider, MD  metoprolol succinate (TOPROL-XL) 100 MG 24 hr tablet Take 1 tablet (100 mg total) by mouth 2 (two) times daily. Take with or immediately following a meal. 05/17/16  Yes LorBurtis JunesP  potassium chloride SA (KLOR-CON M20) 20 MEQ tablet Take 1 tablet (20 mEq total) by mouth 2 (two) times daily. Patient taking differently: Take 20 mEq by mouth 2 (two) times daily as needed (low potassium).  05/01/16 07/30/16 Yes LorBurtis JunesP  rosuvastatin (CRESTOR) 40 MG tablet TAKE ONE TABLET BY MOUTH ONCE DAILY Patient taking differently: TAKE 84m60mBLET BY MOUTH ONCE DAILY 10/25/15  Yes DaltLarey Dresser  TRULICITY 0.759.380HW/2.9HBN Inject 0.75 mg into the skin every Wednesday.  12/30/15  Yes Historical Provider, MD  ZETIA 10 MG tablet Take 1 tablet by mouth  daily Patient taking differently: Take 10mg54mmouth once daily 11/17/15  Yes Ajay Elayne Snare Insulin Disposable Pump (V-GO 40) KIT USE 1 PER DAY 06/22/16   Ajay Elayne Snare lidocaine-prilocaine (EMLA) cream Apply to affected area once Patient taking differently: Apply 1 application topically daily as needed  (port access). Apply to affected area once 01/26/16   VinayNicholas Lose oseltamivir (TAMIFLU) 30 MG capsule Take 1 capsule (30 mg total) by mouth 2 (two) times daily. Patient not taking: Reported on 06/29/2016 06/24/16   Ryan Patrecia Pour predniSONE (DELTASONE) 20 MG tablet Take 2 tablets (  40 mg total) by mouth daily with breakfast. Patient not taking: Reported on 06/29/2016 06/25/16   Patrecia Pour, MD    Physical Exam: Vitals:   06/29/16 1430 06/29/16 1500 06/29/16 1600 06/29/16 1705  BP: 110/73 108/61 (!) 95/54 125/95  Pulse: 77 73 77 74  Resp: 13 15 21 18   Temp:      TempSrc:      SpO2: 99% 96% 97% 100%     Constitutional: NAD, calm, comfortable Eyes: PERRL, lids and conjunctivae normal ENMT: Mucous membranes are dry. Posterior pharynx clear of any exudate or lesions.Normal dentition.  Neck: normal, supple, no masses, no thyromegaly Respiratory: clear to auscultation bilaterally, no wheezing, no crackles. Normal respiratory effort. No accessory muscle use.  Cardiovascular: Regular rate and rhythm, no murmurs / rubs / gallops. No extremity edema. 2+ pedal pulses.  Abdomen: no tenderness, no masses palpated. No hepatosplenomegaly. Bowel sounds positive.  Musculoskeletal: no clubbing / cyanosis. No joint deformity upper and lower extremities. Good ROM, no contractures. Normal muscle tone.  Skin: no rashes, lesions, ulcers. No induration Neurologic: CN 2-12 grossly intact. Strength 5/5 in all 4. No focal deficits.  Psychiatric: Normal judgment and insight. Alert and oriented x 3. Normal mood.   Labs on Admission: I have personally reviewed following labs and imaging studies  CBC:  Recent Labs Lab 06/23/16 0141 06/24/16 0520 06/29/16 1430  WBC 8.0 8.2 9.6  HGB 9.6* 10.1* 12.6  HCT 28.8* 31.9* 36.7  MCV 93.8 92.7 89.5  PLT 110* 128* 300   Basic Metabolic Panel:  Recent Labs Lab 06/23/16 0141 06/24/16 0520 06/29/16 1430  NA 135 135 139  K 3.6 4.7 3.8  CL 98* 98* 102    CO2 31 27 30   GLUCOSE 198* 290* 188*  BUN 40* 49* 38*  CREATININE 1.78* 1.71* 1.53*  CALCIUM 8.5* 8.8* 9.3   GFR: Estimated Creatinine Clearance: 37.8 mL/min (by C-G formula based on SCr of 1.53 mg/dL (H)). Liver Function Tests: No results for input(s): AST, ALT, ALKPHOS, BILITOT, PROT, ALBUMIN in the last 168 hours. No results for input(s): LIPASE, AMYLASE in the last 168 hours. No results for input(s): AMMONIA in the last 168 hours. Coagulation Profile: No results for input(s): INR, PROTIME in the last 168 hours. Cardiac Enzymes:  Recent Labs Lab 06/22/16 2109 06/23/16 0141  TROPONINI <0.03 <0.03   BNP (last 3 results) No results for input(s): PROBNP in the last 8760 hours. HbA1C: No results for input(s): HGBA1C in the last 72 hours. CBG:  Recent Labs Lab 06/23/16 2153 06/24/16 0429 06/24/16 0737 06/29/16 1304 06/29/16 1429  GLUCAP 173* 238* 253* 206* 180*   Lipid Profile: No results for input(s): CHOL, HDL, LDLCALC, TRIG, CHOLHDL, LDLDIRECT in the last 72 hours. Thyroid Function Tests: No results for input(s): TSH, T4TOTAL, FREET4, T3FREE, THYROIDAB in the last 72 hours. Anemia Panel: No results for input(s): VITAMINB12, FOLATE, FERRITIN, TIBC, IRON, RETICCTPCT in the last 72 hours. Urine analysis:    Component Value Date/Time   COLORURINE YELLOW 06/22/2016 1207   APPEARANCEUR HAZY (A) 06/22/2016 1207   LABSPEC 1.012 06/22/2016 1207   LABSPEC 1.020 02/24/2016 1119   PHURINE 6.0 06/22/2016 1207   GLUCOSEU 50 (A) 06/22/2016 1207   GLUCOSEU Negative 02/24/2016 1119   HGBUR SMALL (A) 06/22/2016 1207   BILIRUBINUR NEGATIVE 06/22/2016 1207   BILIRUBINUR Negative 02/24/2016 1119   KETONESUR NEGATIVE 06/22/2016 1207   PROTEINUR 30 (A) 06/22/2016 1207   UROBILINOGEN 0.2 02/24/2016 1119   NITRITE NEGATIVE 06/22/2016 1207  LEUKOCYTESUR NEGATIVE 06/22/2016 1207   LEUKOCYTESUR Trace 02/24/2016 1119   Sepsis Labs:  !!!!!!!!!!!!!!!!!!!!!!!!!!!!!!!!!!!!!!!!!!!! @LABRCNTIP (procalcitonin:4,lacticidven:4) ) Recent Results (from the past 240 hour(s))  Urine culture     Status: Abnormal   Collection Time: 06/22/16 12:07 PM  Result Value Ref Range Status   Specimen Description URINE, CLEAN CATCH  Final   Special Requests NONE  Final   Culture >=100,000 COLONIES/mL ESCHERICHIA COLI (A)  Final   Report Status 06/25/2016 FINAL  Final   Organism ID, Bacteria ESCHERICHIA COLI (A)  Final      Susceptibility   Escherichia coli - MIC*    AMPICILLIN 4 SENSITIVE Sensitive     CEFAZOLIN <=4 SENSITIVE Sensitive     CEFTRIAXONE <=1 SENSITIVE Sensitive     CIPROFLOXACIN <=0.25 SENSITIVE Sensitive     GENTAMICIN <=1 SENSITIVE Sensitive     IMIPENEM <=0.25 SENSITIVE Sensitive     NITROFURANTOIN <=16 SENSITIVE Sensitive     TRIMETH/SULFA <=20 SENSITIVE Sensitive     AMPICILLIN/SULBACTAM <=2 SENSITIVE Sensitive     PIP/TAZO <=4 SENSITIVE Sensitive     Extended ESBL NEGATIVE Sensitive     * >=100,000 COLONIES/mL ESCHERICHIA COLI  Culture, blood (routine x 2)     Status: None   Collection Time: 06/22/16  9:08 PM  Result Value Ref Range Status   Specimen Description BLOOD BLOOD RIGHT FOREARM  Final   Special Requests BOTTLES DRAWN AEROBIC AND ANAEROBIC 5 CC  Final   Culture   Final    NO GROWTH 5 DAYS Performed at [facility], [address], [place], [place] [address]    Report Status 06/28/2016 FINAL  Final  Culture, blood (routine x 2)     Status: None   Collection Time: 06/22/16  9:09 PM  Result Value Ref Range Status   Specimen Description LEFT ANTECUBITAL  Final   Special Requests BOTTLES DRAWN AEROBIC AND ANAEROBIC 5CC  Final   Culture   Final    NO GROWTH 5 DAYS Performed at [facility], [place] [address] [place],  [address]    Report Status 06/28/2016 FINAL  Final     Radiological Exams on Admission: Dg Chest 2 View  Result Date: 06/29/2016 CLINICAL DATA:  Seizures today with  loss of consciousness. Undergoing radiation for breast cancer. EXAM: CHEST  2 VIEW COMPARISON:  06/22/2016 and 04/25/2016. FINDINGS: The left subclavian Port-A-Cath tip appears unchanged near the cavoatrial junction. The heart size and mediastinal contours are stable post CABG and aortic valve replacement. The lungs are clear. There is no pleural effusion or pneumothorax. Old fracture of the proximal right humerus noted. No worrisome osseous findings. IMPRESSION: Stable postoperative chest. No acute findings or explanation for seizures. Electronically Signed   By: Richardean Sale M.D.   On: 06/29/2016 16:00   Ct Head Wo Contrast  Result Date: 06/29/2016 CLINICAL DATA:  Seizure-like activity today. History of breast cancer. Chemotherapy completed. Radiation therapy ongoing. EXAM: CT HEAD WITHOUT CONTRAST TECHNIQUE: Contiguous axial images were obtained from the base of the skull through the vertex without intravenous contrast. COMPARISON:  CT head 03/23/2010. FINDINGS: Brain: There is no evidence of acute intracranial hemorrhage, mass lesion, brain edema or extra-axial fluid collection. Stable mild/age-appropriate atrophy. There is no CT evidence of acute cortical infarction. Vascular: Intracranial vascular calcifications are present. Skull: Stable calvarial hyperostosis. No focal lytic or blastic lesion identified. No evidence of skull fracture. Sinuses/Orbits: The visualized paranasal sinuses and mastoid air cells are clear. No orbital abnormalities are seen.  Other: None. IMPRESSION: Stable examination without acute findings or explanation for seizures. Electronically Signed   By: Richardean Sale M.D.   On: 06/29/2016 15:27    EKG: Independently reviewed. Normal sinus   Assessment/Plan Principal Problem:   Seizure (HCC) Active Problems:   Essential hypertension   Diabetes mellitus, type 2 (HCC)   CAD (coronary artery disease)   CKD (chronic kidney disease)   Chronic diastolic CHF (congestive heart  failure) (HCC)   Chemotherapy-induced peripheral neuropathy (HCC)   Seizures -Neurology consulted  -MRI and EEG ordered  -Seizure precaution  -Ativan prn for seizure   CKD stage 3 -Baseline Cr 1.4-1.6 -Stable   CAD s/p CABG Chronic diastolic heart failure Essential HTN -Stable -Continue lasix, aspirin, plavix, statin, toprol   Paroxysmal A fib -CHADSVASc 7 -Not on anticoagulation  -Currently NSR   IDDM, well controlled  -Ha1c 6.4 -Insulin pump, will ask pharmacy to place order for this   Chemo-related neuropathy -Continue neurontin   Breast cancer  -S/p resection, chemo, ongoing radiation tx   Recent admission for influenza, exacerbation of asthma -Finished tamiflu, prednisone -No current symptom -Continue home inhalers/nebs    DVT prophylaxis: lovenox Code Status: Full  Family Communication: husband, granddaughter at bedside Disposition Plan: pending further work up, back home  Consults called: neurology called by EDP  Admission status: observation    It is my clinical opinion that referral for OBSERVATION is reasonable and necessary in this 71 y.o. year old female  presenting with symptoms of seizure-like activity, concerning for seizures  in the context of PMH including breast cancer, CAD, CKD stage 3, IDDM   The aforementioned taken together are felt to place the patient at high risk for further  clinical deterioration. However it is anticipated that the patient may be medically stable for discharge from the hospital within 24 to 48 hours.   Dessa Phi, DO [facility] [email or web address] Password [facility] 06/29/2016, 5:57 PM

## 2016-06-29 NOTE — ED Notes (Signed)
Bed: WA03 Expected date:  Expected time:  Means of arrival:  Comments: Oncology

## 2016-06-29 NOTE — ED Triage Notes (Signed)
Pt was pre-radiation treatment at cancer center and had seizure. Pt vomited with seizure. Pt recently dx with flu and completed tamiflu. Pt's bs: 206, pt has insulin pump. Pt c/o headache, dizziness pre and post seizure.

## 2016-06-29 NOTE — ED Provider Notes (Signed)
[facility] DEPT Provider Note   CSN: [id number] Arrival date & time: 06/29/16  1350     History   Chief Complaint Chief Complaint  Patient presents with  . Seizures    HPI Hannah [name] is a 71 y.o. female.  Patient is a 71 year old female with a history of multiple medical problems including coronary artery disease with CABG 2, diabetes on insulin pump that she changes nightly, breast cancer currently undergoing radiation therapy, recent admission to the hospital one week ago for posttussive syncope, bronchitis and flu positive. Patient finished Tamiflu on Tuesday and prednisone yesterday. She has had significant improvement in her cough but has been generally weak since returning home from the hospital. She denies any change in appetite, nausea or vomiting. Today when he sat radiation waiting for her appointment her granddaughter states she suddenly slumped her head down towards her chest and started breathing heavily. At that time she was still responsive and then she was being wheeled back to a bed for radiation she became unresponsive and started shaking bilateral upper extremities and chest for approximately 1 minute and then was unresponsive for an unknown known amount of time. She then began responding again and had another episode of slumping her head down and breathing deeply but was responsive through that. Patient does not remember any of these event.   Currently she has no complaints and feels fine.   The history is provided by the patient.    Past Medical History:  Diagnosis Date  . Abnormally small mouth   . Anemia   . Bilateral carotid artery stenosis    Bilateral ICA 40-59%/  >50% LECA  . Breast cancer ([place])   . Breast cancer of upper-inner quadrant of right female breast ([place]) 12/16/2015  . Bruises easily   . CAD (coronary artery disease) cardiologist-  dr Aundra Dubin   Remote CABG in 1998 with redo in 2009  . CHF (congestive heart failure) (HCC)    EF is low  normal at 50 to 55% per echo in Jan. 2011  . Dyspnea   . GERD (gastroesophageal reflux disease)   . Hearing loss of right ear   . History of non-ST elevation myocardial infarction (NSTEMI)    Sept 2014--  thought to be type II HTN w/ LHC without infarct related artery and patent grafts  . Hyperlipidemia   . Hypertension   . Iron deficiency anemia   . Moderate persistent asthma    pulmologist-  Dr. Malvin Johns  . Nausea & vomiting    related to stone  . Nonischemic cardiomyopathy ([place])   . PAF (paroxysmal atrial fibrillation) ([place])   . PONV (postoperative nausea and vomiting)    pt states patch worked well before  . Psoriasis    right leg  . Renal calculus, right   . Renal insufficiency, mild   . S/P AVR    prosthesis valve placement 2009 at same time re-do CABG  . 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 Type 2 diabetes mellitus ([place])    monitored by dr Dwyane Dee    Patient Active Problem List   Diagnosis Date Noted  . Influenza A   . Syncope   . Hypoxia 06/22/2016  . Chemotherapy-induced peripheral neuropathy ([place]) 04/27/2016  . 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 Cellulitis of left leg 04/06/2016  . Osteomyelitis of toe of left foot ([place]) 04/06/2016  . Antineoplastic chemotherapy induced anemia 03/09/2016  . Mania ([place]) 03/05/2016  . GERD (gastroesophageal reflux disease)  02/29/2016  . Chemotherapy induced nausea and vomiting 02/24/2016  . Port catheter in place 02/17/2016  . Breast cancer of upper-inner quadrant of right female breast ([place]) 12/16/2015  . Acute bronchitis 08/01/2015  . CKD (chronic kidney disease) 09/24/2013  . Chronic diastolic CHF (congestive heart failure) ([place]) 09/24/2013  . Acute on chronic combined systolic and diastolic CHF, NYHA class 3 ([place]) 07/17/2013  . Chest pain 06/15/2013  . Unstable angina ([place]) 06/14/2013  . Atrial tachycardia ([place]) 04/03/2013  . Non-ST elevation myocardial infarction (NSTEMI), initial episode of care  ([place]) 02/23/2013  . CAD (coronary artery disease) 01/24/2011  . S/P aortic valve replacement 10/27/2010  . Hypertension   . Hyperlipidemia   . SOB (shortness of breath)   . CHF (congestive heart failure) ([place])   . Renal insufficiency, mild   . Dyspnea   . Nonischemic cardiomyopathy ([place])   . Anemia   . Diabetes mellitus, type 2 ([place])   . Iron deficiency anemia   . Allergic rhinitis 10/14/2009  . Cough 10/14/2009  . HYPERLIPIDEMIA 05/12/2009  . Essential hypertension 05/12/2009  . Atrial fibrillation ([place]) 05/12/2009  . Asthma 05/12/2009  . DYSPNEA 05/12/2009    Past Surgical History:  Procedure Laterality Date  . AORTIC VALVE REPLACEMENT  2009   [facility]92m E1800 [facility] (done same time is CABG)  . BREAST LUMPECTOMY WITH RADIOACTIVE SEED AND SENTINEL LYMPH NODE BIOPSY Right 01/18/2016   Procedure: RIGHT BREAST LUMPECTOMY WITH RADIOACTIVE SEED AND SENTINEL LYMPH NODE BIOPSY;  Surgeon: DAlphonsa Overall MD;  [facility] [place]  Service: General;  Laterality: Right;  . CARDIAC CATHETERIZATION  03/23/2008   Pre-redo CABG: L main OK, LAD (T), CFX (T), OM1 99%, RCA (T), LIMA-LAD OK, SVG-OM(?3) OK w/ little florw to OM2, SVG-RCA OK. EF NL  . CARDIOVASCULAR STRESS TEST  06-15-2013      dr hDebara Pickett  [place] Adamperfusion study/  no evidence ischemia/  normal wall motion, ef 72%  . CARPAL TUNNEL RELEASE    . COLONOSCOPY    . CORONARY ARTERY BYPASS GRAFT  1998 &  re-do 2009   Had LIMA to DX/LAD, SVG to 2 marginal branches and [facility]; SVG to 3rd OM and PD at time of redo  . CYSTOSCOPY W/ URETERAL STENT PLACEMENT Right 12/20/2014   Procedure: CYSTOSCOPY WITH RETROGRADE PYELOGRAM/URETERAL STENT PLACEMENT;  Surgeon: PCleon Gustin MD;  [facility]  Service: Urology;  Laterality: Right;  . EYE SURGERY Bilateral    cataracts  . HOLMIUM LASER APPLICATION Right 79/72/8206  Procedure:  HOLMIUM LASER LITHOTRIPSY;  Surgeon: PCleon Gustin MD;   [facility]  Service: Urology;  Laterality: Right;  . LEFT HEART CATHETERIZATION WITH CORONARY/GRAFT ANGIOGRAM N/A 02/23/2013   Procedure: LEFT HEART CATHETERIZATION WITH CBeatrix Fetters  Surgeon: MBlane Ohara MD;  [facility];  Service: Cardiovascular;  Laterality: N/A;  . PORTACATH PLACEMENT Left 01/18/2016   Procedure: INSERTION PORT-A-CATH;  Surgeon: DAlphonsa Overall MD;  [place]  Service: General;  Laterality: Left;  . TONSILLECTOMY    . TRANSTHORACIC ECHOCARDIOGRAM  02-24-2013      mild LVH,  ef 50-55%/  AV bioprosthesis was present with very mild stenosis and no regurg., mean grandient 170mg, peak grandient 2046m /  mild MR/  mild LAE and RAE/  moderate TR  . TUBAL LIGATION      OB History    No data available  Home Medications    Prior to Admission medications   Medication Sig Start Date End Date Taking? Authorizing Provider  albuterol (PROVENTIL HFA;VENTOLIN HFA) 108 (90 Base) MCG/ACT inhaler Inhale 2 puffs into the lungs every 6 (six) hours as needed for wheezing or shortness of breath.    Historical Provider, MD  alendronate (FOSAMAX) 70 MG tablet Take 70 mg by mouth every Wednesday. Take with a full glass of water on an empty stomach.     Historical Provider, MD  aspirin EC 81 MG EC tablet Take 1 tablet (81 mg total) by mouth daily. 06/16/13   Brittainy Erie Noe, PA-C  budesonide-formoterol (SYMBICORT) 160-4.5 MCG/ACT inhaler INHALE TWO PUFFS INTO LUNGS TWICE DAILY 07/21/15   Collene Gobble, MD  clopidogrel (PLAVIX) 75 MG tablet Take 75 mg by mouth daily.    Historical Provider, MD  furosemide (LASIX) 80 MG tablet Take 1 tablet (80 mg total) by mouth daily. 1 tablet in the AM and 1/2 tablet (40 mg) in the early afternoon. Patient taking differently: Take 40-80 mg by mouth 2 (two) times daily. Take 80 mg in the morning and then 64m in the afternoon, if pt is going to be out during the day the pt takes 465min the am and  then 8055mhen she gets back home 05/01/16 07/30/16  LorBurtis JunesP  gabapentin (NEURONTIN) 300 MG capsule Take 1 capsule (300 mg total) by mouth 3 (three) times daily. 03/26/16   VinNicholas LoseD  Insulin Disposable Pump (V-GO 40) KIT USE 1 PER DAY 06/22/16   AjaElayne SnareD  insulin lispro (HUMALOG KWIKPEN) 100 UNIT/ML KiwkPen INJECT 10 UNITS INTO THE SKIN WITH MEALS PLUS SLIDING SCALE Patient taking differently: Inject 10-12 Units into the skin daily as needed. per [facility]id[facility] 05/10/16   AjaElayne SnareD  insulin lispro (HUMALOG) 100 UNIT/ML injection Inject 76 units daily with  V-GO pump 05/10/16   AjaElayne SnareD  ipratropium-albuterol (DUONEB) 0.5-2.5 (3) MG/3ML SOLN Take 3 mLs by nebulization 4 (four) times daily. 06/24/16 07/25/16  RyaPatrecia PourD  lidocaine-prilocaine (EMLA) cream Apply to affected area once Patient taking differently: Apply 1 application topically daily as needed (port access). Apply to affected area once 01/26/16   VinNicholas LoseD  loratadine (CLARITIN) 10 MG tablet Take 10 mg by mouth daily.    Historical Provider, MD  metoprolol succinate (TOPROL-XL) 100 MG 24 hr tablet Take 1 tablet (100 mg total) by mouth 2 (two) times daily. Take with or immediately following a meal. 05/17/16   LorBurtis JunesP  omeprazole (PRILOSEC OTC) 20 MG tablet Take 20 mg by mouth daily as needed (heartburn).     Historical Provider, MD  oseltamivir (TAMIFLU) 30 MG capsule Take 1 capsule (30 mg total) by mouth 2 (two) times daily. Patient not taking: Reported on 06/29/2016 06/24/16   RyaPatrecia PourD  potassium chloride SA (KLOR-CON M20) 20 MEQ tablet Take 1 tablet (20 mEq total) by mouth 2 (two) times daily. Patient taking differently: Take 20 mEq by mouth 2 (two) times daily as needed (low potassium).  05/01/16 07/30/16  LorBurtis JunesP  predniSONE (DELTASONE) 20 MG tablet Take 2 tablets (40 mg total) by mouth daily with breakfast. Patient not taking: Reported on 06/29/2016 06/25/16   RyaPatrecia PourD  rosuvastatin (CRESTOR) 40 MG tablet TAKE ONE TABLET BY MOUTH ONCE DAILY Patient taking differently: TAKE 64m54mBLET BY MOUTH ONCE DAILY 10/25/15   DaltElby Showers  Aundra Dubin, MD  TraMADol HCl 200 MG CP24 Take 200 mg by mouth daily as needed (for back pain).  03/15/16   Historical Provider, MD  TRULICITY 7.67 HA/1.9FX SOPN Inject 0.75 mg into the skin every Wednesday.  12/30/15   Historical Provider, MD  ZETIA 10 MG tablet Take 1 tablet by mouth  daily Patient taking differently: Take 26m by mouth once daily 11/17/15   AElayne Snare MD    Family History Family History  Problem Relation Age of Onset  . Heart disease Mother   . Heart disease Father   . Heart failure Father   . Diabetes Maternal Grandmother   . Diabetes Son     Social History Social History  Substance Use Topics  . Smoking status: Never Smoker  . Smokeless tobacco: Never Used  . Alcohol use No     Allergies   Amoxicillin; Imdur [isosorbide dinitrate]; Latex; and Tetracycline   Review of Systems Review of Systems  All other systems reviewed and are negative.    Physical Exam Updated Vital Signs BP 110/73   Pulse 77   Temp 98.3 F (36.8 C) (Oral)   Resp 13   SpO2 99%   Physical Exam  Constitutional: She is oriented to person, place, and time. She appears well-developed and well-nourished. No distress.  HENT:  Head: Normocephalic and atraumatic.  Mouth/Throat: Oropharynx is clear and moist.  Eyes: Conjunctivae and EOM are normal. Pupils are equal, round, and reactive to light.  Neck: Normal range of motion. Neck supple.  Cardiovascular: Normal rate, regular rhythm and intact distal pulses.   No murmur heard. Pulmonary/Chest: Effort normal and breath sounds normal. No respiratory distress. She has no wheezes. She has no rales.  Abdominal: Soft. She exhibits no distension. There is no tenderness. There is no rebound and no guarding.  Musculoskeletal: Normal range of motion. She exhibits no edema or  tenderness.  Neurological: She is alert and oriented to person, place, and time. She has normal strength. No cranial nerve deficit or sensory deficit. Coordination normal.  Skin: Skin is warm and dry. No rash noted. No erythema.  Psychiatric: She has a normal mood and affect. Her behavior is normal.  Nursing note and vitals reviewed.    ED Treatments / Results  Labs (all labs ordered are listed, but only abnormal results are displayed) Labs Reviewed  CBG MONITORING, ED - Abnormal; Notable for the following:       Result Value   Glucose-Capillary 206 (*)    All other components within normal limits  CBG MONITORING, ED - Abnormal; Notable for the following:    Glucose-Capillary 180 (*)    All other components within normal limits  CBC  BASIC METABOLIC PANEL    EKG  EKG Interpretation None       Radiology No results found.  Procedures Procedures (including critical care time)  Medications Ordered in ED Medications - No data to display   Initial Impression / Assessment and Plan / ED Course  I have reviewed the triage vital signs and the nursing notes.  Pertinent labs & imaging results that were available during my care of the patient were reviewed by me and considered in my medical decision making (see chart for details).    Patient is a 71year old female with multiple medical problems presenting today after an episode that sounds potential for seizure. Patient was unresponsive during this event and had a short postictal period and then woke up and ask where she was at. She  denied any chest pain, palpitations or shortness of breath before the event. She is recently been discharged from the hospital after bronchitis and syncope as well as being flu positive and states she's been generally weak but denies any specific symptoms. She is currently getting radiation for breast cancer and has received chemotherapy. She does use a disposable insulin pump which she changes nightly.  Patient's blood sugar throughout this event was greater than 100. She currently has no complaints. Patient had similar episodes years ago and they could never figure out what they were. They occurred 3-4 months after having a stroke. Has never had a diagnosis of seizure and does not take seizure prophylaxis.  We'll scan patient's head to ensure no evidence of metastatic disease. We'll discuss with neurology. Labs without evidence of change in renal function, electrolytes. CBC unchanged.  Head CT neg for acute findings. Spoke with Dr. Lawana Pai with neurology who is also concern for possible seizure activity. He recommended MRI with and without contrast. Also patient needs an EEG but would not start antiepileptics total after monitoring. Would recommend Depakote as patient has chronic renal insufficiency would not twice a day of her Keppra. Final Clinical Impressions(s) / ED Diagnoses   Final diagnoses:  Seizure-like activity [facility])    New Prescriptions New Prescriptions   No medications on file     Blanchie Dessert, MD 06/29/16 [id number]

## 2016-06-29 NOTE — ED Notes (Signed)
Pt states headache is resolved.

## 2016-06-30 ENCOUNTER — Observation Stay (HOSPITAL_COMMUNITY): Payer: Medicare Other

## 2016-06-30 DIAGNOSIS — R569 Unspecified convulsions: Secondary | ICD-10-CM | POA: Diagnosis not present

## 2016-06-30 LAB — BASIC METABOLIC PANEL
Anion gap: 8 (ref 5–15)
BUN: 36 mg/dL — ABNORMAL HIGH (ref 6–20)
CO2: 30 mmol/L (ref 22–32)
Calcium: 9.4 mg/dL (ref 8.9–10.3)
Chloride: 103 mmol/L (ref 101–111)
Creatinine, Ser: 1.6 mg/dL — ABNORMAL HIGH (ref 0.44–1.00)
GFR calc Af Amer: 37 mL/min — ABNORMAL LOW (ref >60)
GFR calc non Af Amer: 32 mL/min — ABNORMAL LOW (ref >60)
Glucose, Bld: 164 mg/dL — ABNORMAL HIGH (ref 65–99)
Potassium: 3.5 mmol/L (ref 3.5–5.1)
Sodium: 141 mmol/L (ref 135–145)

## 2016-06-30 LAB — CBC
HCT: 34.9 % — ABNORMAL LOW (ref 36.0–46.0)
Hemoglobin: 11.3 g/dL — ABNORMAL LOW (ref 12.0–15.0)
MCH: 30 pg (ref 26.0–34.0)
MCHC: 32.4 g/dL (ref 30.0–36.0)
MCV: 92.6 fL (ref 78.0–100.0)
Platelets: 179 10*3/uL (ref 150–400)
RBC: 3.77 MIL/uL — ABNORMAL LOW (ref 3.87–5.11)
RDW: 13.5 % (ref 11.5–15.5)
WBC: 8.5 10*3/uL (ref 4.0–10.5)

## 2016-06-30 LAB — GLUCOSE, CAPILLARY
Glucose-Capillary: 92 mg/dL (ref 65–99)
Glucose-Capillary: 101 mg/dL — ABNORMAL HIGH (ref 65–99)
Glucose-Capillary: 180 mg/dL — ABNORMAL HIGH (ref 65–99)
Glucose-Capillary: 111 mg/dL — ABNORMAL HIGH (ref 65–99)
Glucose-Capillary: 55 mg/dL — ABNORMAL LOW (ref 65–99)
Glucose-Capillary: 115 mg/dL — ABNORMAL HIGH (ref 65–99)

## 2016-06-30 MED ORDER — LORAZEPAM 2 MG/ML IJ SOLN: 1.0000 mg | INTRAMUSCULAR | Status: DC | PRN

## 2016-06-30 MED ORDER — FUROSEMIDE 40 MG PO TABS: 40.0000 mg | ORAL_TABLET | Freq: Every day | ORAL | Status: DC

## 2016-06-30 MED ORDER — HEPARIN SODIUM (PORCINE) 5000 UNIT/ML IJ SOLN
5000.0000 [IU] | Freq: Three times a day (TID) | INTRAMUSCULAR | Status: DC
  Filled 2016-06-30 (×3): qty 1

## 2016-06-30 MED ORDER — ROSUVASTATIN CALCIUM 40 MG PO TABS
40.0000 mg | ORAL_TABLET | Freq: Every day | ORAL | Status: DC
  Administered 2016-06-30 – 2016-07-04 (×5): 40 mg via ORAL
  Filled 2016-06-30 (×5): qty 1

## 2016-06-30 MED ORDER — CLOPIDOGREL BISULFATE 75 MG PO TABS
75.0000 mg | ORAL_TABLET | Freq: Every day | ORAL | Status: DC
  Administered 2016-06-30 – 2016-07-04 (×5): 75 mg via ORAL
  Filled 2016-06-30 (×5): qty 1

## 2016-06-30 MED ORDER — FUROSEMIDE 40 MG PO TABS
40.0000 mg | ORAL_TABLET | Freq: Every day | ORAL | Status: DC
  Administered 2016-06-30 – 2016-07-01 (×2): 40 mg via ORAL
  Filled 2016-06-30 (×2): qty 1

## 2016-06-30 MED ORDER — METOPROLOL SUCCINATE ER 100 MG PO TB24
100.0000 mg | ORAL_TABLET | Freq: Two times a day (BID) | ORAL | Status: DC
  Administered 2016-06-30 – 2016-07-01 (×5): 100 mg via ORAL
  Filled 2016-06-30 (×5): qty 1

## 2016-06-30 MED ORDER — GABAPENTIN 300 MG PO CAPS
300.0000 mg | ORAL_CAPSULE | Freq: Three times a day (TID) | ORAL | Status: DC
  Administered 2016-06-30 – 2016-07-04 (×14): 300 mg via ORAL
  Filled 2016-06-30 (×14): qty 1

## 2016-06-30 MED ORDER — FUROSEMIDE 80 MG PO TABS
80.0000 mg | ORAL_TABLET | Freq: Every day | ORAL | Status: DC
  Filled 2016-06-30: qty 1

## 2016-06-30 MED ORDER — FUROSEMIDE 80 MG PO TABS
80.0000 mg | ORAL_TABLET | Freq: Every day | ORAL | Status: DC
  Administered 2016-06-30: 80 mg via ORAL
  Filled 2016-06-30: qty 1

## 2016-06-30 MED ORDER — EZETIMIBE 10 MG PO TABS
10.0000 mg | ORAL_TABLET | Freq: Every day | ORAL | Status: DC
  Administered 2016-06-30 – 2016-07-04 (×5): 10 mg via ORAL
  Filled 2016-06-30 (×5): qty 1

## 2016-06-30 MED ORDER — ALBUTEROL SULFATE (2.5 MG/3ML) 0.083% IN NEBU
2.5000 mg | INHALATION_SOLUTION | Freq: Four times a day (QID) | RESPIRATORY_TRACT | Status: DC | PRN
Start: 2016-06-30 — End: 2016-07-04

## 2016-06-30 MED ORDER — INSULIN PUMP
Freq: Three times a day (TID) | SUBCUTANEOUS | Status: DC
  Administered 2016-06-30: 8 via SUBCUTANEOUS
  Administered 2016-06-30 – 2016-07-02 (×8): via SUBCUTANEOUS
  Administered 2016-07-02: 1 via SUBCUTANEOUS
  Filled 2016-06-30: qty 1

## 2016-06-30 MED ORDER — MOMETASONE FURO-FORMOTEROL FUM 200-5 MCG/ACT IN AERO
2.0000 | INHALATION_SPRAY | Freq: Two times a day (BID) | RESPIRATORY_TRACT | Status: DC
  Administered 2016-06-30 – 2016-07-04 (×7): 2 via RESPIRATORY_TRACT
  Filled 2016-06-30 (×2): qty 8.8

## 2016-06-30 MED ORDER — ASPIRIN EC 81 MG PO TBEC
81.0000 mg | DELAYED_RELEASE_TABLET | Freq: Every day | ORAL | Status: DC
Start: 2016-06-30 — End: 2016-07-04
  Administered 2016-06-30 – 2016-07-04 (×5): 81 mg via ORAL
  Filled 2016-06-30 (×5): qty 1

## 2016-06-30 MED ORDER — SODIUM CHLORIDE 0.9% FLUSH
10.0000 mL | INTRAVENOUS | Status: DC | PRN
  Administered 2016-07-02 – 2016-07-04 (×3): 10 mL
  Filled 2016-06-30 (×3): qty 40

## 2016-06-30 MED ORDER — LORATADINE 10 MG PO TABS
10.0000 mg | ORAL_TABLET | Freq: Every day | ORAL | Status: DC
  Administered 2016-06-30 – 2016-07-04 (×5): 10 mg via ORAL
  Filled 2016-06-30 (×5): qty 1

## 2016-06-30 MED ORDER — FUROSEMIDE 40 MG PO TABS: 40.0000 mg | ORAL_TABLET | Freq: Two times a day (BID) | ORAL | Status: DC

## 2016-06-30 MED ORDER — IPRATROPIUM-ALBUTEROL 0.5-2.5 (3) MG/3ML IN SOLN
3.0000 mL | Freq: Four times a day (QID) | RESPIRATORY_TRACT | Status: DC
  Administered 2016-06-30: 3 mL via RESPIRATORY_TRACT
  Filled 2016-06-30: qty 3

## 2016-06-30 NOTE — Progress Notes (Signed)
   06/30/16 0059  Vitals  Temp 98 F (36.7 C)  Temp Source Oral  BP (!) 107/57  BP Location Left Arm  BP Method Automatic  Patient Position (if appropriate) Sitting  Pulse Rate (!) 128  Pulse Rate Source Dinamap  Resp 18  Oxygen Therapy  SpO2 96 %  O2 Device Room Air  Height and Weight  Height 5\' 5"  (1.651 m)  Weight 87.6 kg (193 lb 3.2 oz)  Type of Scale Used Standing  Type of Weight Actual  BSA (Calculated - sq m) 2 sq meters  BMI (Calculated) 32.2  Weight in (lb) to have BMI = 25 149.9  Admitted patient to rm 3E06 from [facility], pt alert and oriented, denied pain at this time, oriented to room, call placed within reach, placed on cardiac monitor, CCMD made aware. Pt placed on seizure precaution. Family at bedside.

## 2016-06-30 NOTE — Progress Notes (Signed)
EEG Completed; Results Pending  

## 2016-06-30 NOTE — Progress Notes (Signed)
  Spoke to patient and husband. 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  She was diagnosed with breast cancer 12/2015 and is s/p right side lumpectomy with negative nodes. She has completed chemo.  Radiation was planned prior to recent events.     MRI had a lot of motion artifact,but no clear parenchymal lesions or breast metastasis.  EEG is pending.  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  I will consider LP with cytology after EEG.   I will initiate AED as well after the EEG.   Rogue Jury, MD

## 2016-06-30 NOTE — Progress Notes (Signed)
PROGRESS NOTE  NOVALYNN BRANAMAN  OEH:[id number] DOB: 11-May-1946  DOA: 06/29/2016 PCP: Nicola Girt, DO   Brief Narrative:  Ms Burley is a 71 y.o. female with medical history significant for multiple comorbidities including but not limited to  Breast cancer diagnosed (S/P Right Lumpectomy with negative nodes, S/P chemotherapy, currently undergoing radiation therapy M-F, per Dr. Sondra Come), Paroxysmal Atrial Fibrillation (CHADS-Vasc score of 7, she is not anticoagulated), S/P aortic valve replacement, and CAD S/P CABG x 2, Type 2 DM (uses a disposable insulin pump; changed qHS). She was recently discharged hospital on 06/24/2016 after treatment and management for flu.  She presented with a day's h/o of an episode of seizure activity. She first had seizure in July 2013 when she "had a stroke" and was never put on AED, but did well without any seizure until last Friday when she had 3 episodes of seizure activity. Seizures are described as an episode of generalized weakness followed by bilateral upper extremity shaking lasting about a minute with loss of consciousness.  CT head and CXR were negative. Neurology was consulted, and recommended MRI as well as EEG.      Assessment & Plan:   Principal Problem:   Seizure ([place]) Active Problems:   Essential hypertension   Diabetes mellitus, type 2 (HCC)   CAD (coronary artery disease)   CKD (chronic kidney disease)   Chronic diastolic CHF (congestive heart failure) (HCC)   Chemotherapy-induced peripheral neuropathy ([place])  1. Seizure: Routine seizure precaution MRI suboptimal due to motion degraded artifacts without any obvious parenchymal lesions/ breast metastasis Possible LP with cytology at EEG planned per Neurology Follow-up on EEG. Antiseizure treatment per Neurology.   2. Diabetes mellitus: Hemoglobin A1c 6.4%. Follow CBGs with sliding scale insulin as needed  3. Hypertensive heart disease with chronic diastolic heart failure:/CAD s/p  CABG: Blood Pressures seem to be a little bit low;  May need to adjust antihypertensive regimen as needed. Continue diuresis with monitoring of electrolytes.  Secondary preventative measures with antiplatelet treatment, statin  4. Paroxysmal atrial fibrillation: CHADSVASc 7 Currently in sinus rhythm. No anticoagulation.   5. Chronic kidney disease stage III: Baseline creatinine 1.4-1.6. Stable. Watch for nephrotoxic(s)  Follow clinically  6. Normocytic anemia:  Suspect anemia chronic disease due to her chronic illness.  Outpatient follow-up   DVT prophylaxis: Lovenox Code Status: Full code Family Communication: Son at bedside Disposition Plan: Pending   Consultants:  Neurology Procedures:  EEG with possible LP pending Antimicrobials:    Subjective: Patient notes transient occipital headaches overnight which is currently resolved. No seizure activity noted overnight. No other acute events reported overnight.  Objective:  Vitals:   06/30/16 0534 06/30/16 0547 06/30/16 0807 06/30/16 0900  BP: (!) 84/54 (!) 93/58  (!) 93/41  Pulse: (!) 123 (!) 125  80  Resp: 16   20  Temp: 98.1 F (36.7 C)   98.2 F (36.8 C)  TempSrc: Oral   Oral  SpO2: 98%  96% 99%  Weight:      Height:        Intake/Output Summary (Last 24 hours) at 06/30/16 1344 Last data filed at 06/30/16 1058  Gross per 24 hour  Intake              240 ml  Output              650 ml  Net             -410 ml   [employer]  06/30/16 0059  Weight: 87.6 kg (193 lb 3.2 oz)    Examination:  General exam: Resting comfortably, no acute distress Respiratory system: Clear to auscultation. Respiratory effort normal. Cardiovascular system: S1 & S2 heard, RRR. No JVD, murmurs, rubs, gallops or clicks. No pedal edema. Gastrointestinal system: Abdomen is nondistended, soft and nontender. No organomegaly or masses felt. Normal bowel sounds heard. Central nervous system: Alert and oriented. No focal  neurological deficits. Extremities: Symmetric 5 x 5 power. Skin: No rashes, lesions or ulcers Psychiatry: Judgement and insight appear normal. Mood & affect appropriate.     Data Reviewed: I have personally reviewed following labs and imaging studies  CBC:  Recent Labs Lab 06/24/16 0520 06/29/16 1430 06/30/16 0154  WBC 8.2 9.6 8.5  HGB 10.1* 12.6 11.3*  HCT 31.9* 36.7 34.9*  MCV 92.7 89.5 92.6  PLT 128* 199 681   Basic Metabolic Panel:  Recent Labs Lab 06/24/16 0520 06/29/16 1430 06/30/16 0154  NA 135 139 141  K 4.7 3.8 3.5  CL 98* 102 103  CO2 27 30 30   GLUCOSE 290* 188* 164*  BUN 49* 38* 36*  CREATININE 1.71* 1.53* 1.60*  CALCIUM 8.8* 9.3 9.4   GFR: Estimated Creatinine Clearance: 35.7 mL/min (by C-G formula based on SCr of 1.6 mg/dL (H)). Liver Function Tests: No results for input(s): AST, ALT, ALKPHOS, BILITOT, PROT, ALBUMIN in the last 168 hours. No results for input(s): LIPASE, AMYLASE in the last 168 hours. No results for input(s): AMMONIA in the last 168 hours. Coagulation Profile: No results for input(s): INR, PROTIME in the last 168 hours. Cardiac Enzymes: No results for input(s): CKTOTAL, CKMB, CKMBINDEX, TROPONINI in the last 168 hours. BNP (last 3 results) No results for input(s): PROBNP in the last 8760 hours. HbA1C: No results for input(s): HGBA1C in the last 72 hours. CBG:  Recent Labs Lab 06/29/16 1304 06/29/16 1429 06/30/16 0107 06/30/16 0550 06/30/16 1117  GLUCAP 206* 180* 92 101* 180*   Lipid Profile: No results for input(s): CHOL, HDL, LDLCALC, TRIG, CHOLHDL, LDLDIRECT in the last 72 hours. Thyroid Function Tests: No results for input(s): TSH, T4TOTAL, FREET4, T3FREE, THYROIDAB in the last 72 hours. Anemia Panel: No results for input(s): VITAMINB12, FOLATE, FERRITIN, TIBC, IRON, RETICCTPCT in the last 72 hours.  Sepsis Labs:  Recent Labs Lab 06/24/16 0520 06/29/16 1430 06/30/16 0154  WBC 8.2 9.6 8.5    Recent Results  (from the past 240 hour(s))  Urine culture     Status: Abnormal   Collection Time: 06/22/16 12:07 PM  Result Value Ref Range Status   Specimen Description URINE, CLEAN CATCH  Final   Special Requests NONE  Final   Culture >=100,000 COLONIES/mL ESCHERICHIA COLI (A)  Final   Report Status 06/25/2016 FINAL  Final   Organism ID, Bacteria ESCHERICHIA COLI (A)  Final      Susceptibility   Escherichia coli - MIC*    AMPICILLIN 4 SENSITIVE Sensitive     CEFAZOLIN <=4 SENSITIVE Sensitive     CEFTRIAXONE <=1 SENSITIVE Sensitive     CIPROFLOXACIN <=0.25 SENSITIVE Sensitive     GENTAMICIN <=1 SENSITIVE Sensitive     IMIPENEM <=0.25 SENSITIVE Sensitive     NITROFURANTOIN <=16 SENSITIVE Sensitive     TRIMETH/SULFA <=20 SENSITIVE Sensitive     AMPICILLIN/SULBACTAM <=2 SENSITIVE Sensitive     PIP/TAZO <=4 SENSITIVE Sensitive     Extended ESBL NEGATIVE Sensitive     * >=100,000 COLONIES/mL ESCHERICHIA COLI  Culture, blood (routine x 2)  Status: None   Collection Time: 06/22/16  9:08 PM  Result Value Ref Range Status   Specimen Description BLOOD BLOOD RIGHT FOREARM  Final   Special Requests BOTTLES DRAWN AEROBIC AND ANAEROBIC 5 CC  Final   Culture   Final    NO GROWTH 5 DAYS Performed at [facility], [place] [address], [place], [place] [address]    Report Status 06/28/2016 FINAL  Final  Culture, blood (routine x 2)     Status: None   Collection Time: 06/22/16  9:09 PM  Result Value Ref Range Status   Specimen Description LEFT ANTECUBITAL  Final   Special Requests BOTTLES DRAWN AEROBIC AND ANAEROBIC 5CC  Final   Culture   Final    NO GROWTH 5 DAYS Performed at [facility], [place] [address], [place],  [address]    Report Status 06/28/2016 FINAL  Final         Radiology Studies: Dg Chest 2 View  Result Date: 06/29/2016 CLINICAL DATA:  Seizures today with loss of consciousness. Undergoing radiation for breast cancer. EXAM: CHEST  2 VIEW COMPARISON:  06/22/2016 and  04/25/2016. FINDINGS: The left subclavian Port-A-Cath tip appears unchanged near the cavoatrial junction. The heart size and mediastinal contours are stable post CABG and aortic valve replacement. The lungs are clear. There is no pleural effusion or pneumothorax. Old fracture of the proximal right humerus noted. No worrisome osseous findings. IMPRESSION: Stable postoperative chest. No acute findings or explanation for seizures. Electronically Signed   By: Richardean Sale M.D.   On: 06/29/2016 16:00   Ct Head Wo Contrast  Result Date: 06/29/2016 CLINICAL DATA:  Seizure-like activity today. History of breast cancer. Chemotherapy completed. Radiation therapy ongoing. EXAM: CT HEAD WITHOUT CONTRAST TECHNIQUE: Contiguous axial images were obtained from the base of the skull through the vertex without intravenous contrast. COMPARISON:  CT head 03/23/2010. FINDINGS: Brain: There is no evidence of acute intracranial hemorrhage, mass lesion, brain edema or extra-axial fluid collection. Stable mild/age-appropriate atrophy. There is no CT evidence of acute cortical infarction. Vascular: Intracranial vascular calcifications are present. Skull: Stable calvarial hyperostosis. No focal lytic or blastic lesion identified. No evidence of skull fracture. Sinuses/Orbits: The visualized paranasal sinuses and mastoid air cells are clear. No orbital abnormalities are seen. Other: None. IMPRESSION: Stable examination without acute findings or explanation for seizures. Electronically Signed   By: Richardean Sale M.D.   On: 06/29/2016 15:27   Mr Jeri Cos And Wo Contrast  Result Date: 06/29/2016 CLINICAL DATA:  Seizure at Radiation today, history of breast cancer, on chemoradiation. Seizure 3 years ago. History of diabetes, atrial fibrillation, hypertension. EXAM: MRI HEAD WITHOUT AND WITH CONTRAST TECHNIQUE: Multiplanar, multiecho pulse sequences of the brain and surrounding structures were obtained without and with intravenous  contrast. CONTRAST:  69mL MULTIHANCE GADOBENATE DIMEGLUMINE 529 MG/ML IV SOLN COMPARISON:  CT HEAD June 29, 2016 at 1509 hours FINDINGS: Moderately motion degraded noncontrast examination. Post gadolinium sequences are moderate to severely motion degraded. INTRACRANIAL CONTENTS: No reduced diffusion to suggest acute ischemia or hypercellular tumor. No susceptibility artifact to suggest hemorrhage. The ventricles and sulci are normal for patient's age. No suspicious parenchymal signal, masses, mass effect. No abnormal intraparenchymal or extra-axial enhancement. No abnormal extra-axial fluid collections. No extra-axial masses. VASCULAR: Normal major intracranial vascular flow voids present at skull base. SKULL AND UPPER CERVICAL SPINE: No abnormal sellar expansion. No suspicious calvarial bone marrow signal. Craniocervical junction maintained. SINUSES/ORBITS: Mild paranasal sinus mucosal thickening. Mastoid air  cells are well aerated. Status post bilateral ocular lens implants. The included ocular globes and orbital contents are non-suspicious. OTHER: None. IMPRESSION: Negative motion degraded MRI head with and without contrast, for age (no intracranial metastasis given moderate to severely motion degraded post gadolinium sequences). Electronically Signed   By: Elon Alas M.D.   On: 06/29/2016 21:14        Scheduled Meds: . aspirin EC  81 mg Oral Daily  . clopidogrel  75 mg Oral Daily  . ezetimibe  10 mg Oral Daily  . furosemide  40 mg Oral Daily   And  . furosemide  80 mg Oral Daily  . gabapentin  300 mg Oral TID  . heparin  5,000 Units Subcutaneous Q8H  . insulin pump   Subcutaneous TID AC, HS, 0200  . loratadine  10 mg Oral Daily  . metoprolol succinate  100 mg Oral BID  . mometasone-formoterol  2 puff Inhalation BID  . rosuvastatin  40 mg Oral Daily  . sodium chloride flush  3 mL Intravenous Q12H   Continuous Infusions:   LOS: 0 days    Time spent: 30  min    OSEI-BONSU,Sevana Grandinetti, MD [facility] Pager [phone]  If 7PM-7AM, please contact night-coverage [email or web address] Password [email or web address] 06/30/2016, 1:44 PM

## 2016-06-30 NOTE — Procedures (Signed)
EEG Report  Clinical History:  Episodes of staring, non-responsiveness, and shaking with history of breast cancer.  EEG description:    Posterior rhythm of 7 Hz reactive to eye opening and closure.    No focal slowing.  No epileptiform discharges or electrographic seizures.    Stage 2 sleep without abnormalities.  Impression:  Mild generalized slowing of brain activity , which is non-specific but may be due to toxic, infectious, or metabolic causes.   This does not rule out epilepsy.     Rogue Jury, MD

## 2016-06-30 NOTE — Consult Note (Signed)
Neurology Consultation Reason for Consult: Possible Seizures Referring Physician: Dessa Phi  CC: Spells  History is obtained from:Patient, record  HPI: Hannah [name] is a 71 y.o. female with a history of a single previous seizure "at the time of a stroke" who was recently admitted for flu and had several episodes on 1/12 of LOC which were attributed to syncope. She states that she feels that she did pass out with them. She was apparetnly hypoxic at the time and diagnosed with flu.   Today, she apprently became "weak". She then had bilateral upper extremity shaking lasting approximately a minute then was unresponsive. She is amnestic to the events.   ROS: A 14 point ROS was performed and is negative except as noted in the HPI.   Past Medical History:  Diagnosis Date  . Abnormally small mouth   . Anemia   . Bilateral carotid artery stenosis    Bilateral ICA 40-59%/  >50% LECA  . Breast cancer ([place])   . Breast cancer of upper-inner quadrant of right female breast ([place]) 12/16/2015  . Bruises easily   . CAD (coronary artery disease) cardiologist-  dr Aundra Dubin   Remote CABG in 1998 with redo in 2009  . CHF (congestive heart failure) (HCC)    EF is low normal at 50 to 55% per echo in Jan. 2011  . Dyspnea   . GERD (gastroesophageal reflux disease)   . Hearing loss of right ear   . History of non-ST elevation myocardial infarction (NSTEMI)    Sept 2014--  thought to be type II HTN w/ LHC without infarct related artery and patent grafts  . Hyperlipidemia   . Hypertension   . Iron deficiency anemia   . Moderate persistent asthma    pulmologist-  Dr. Malvin Johns  . Nausea & vomiting    related to stone  . Nonischemic cardiomyopathy ([place])   . PAF (paroxysmal atrial fibrillation) ([place])   . PONV (postoperative nausea and vomiting)    pt states patch worked well before  . Psoriasis    right leg  . Renal calculus, right   . Renal insufficiency, mild   . S/P AVR    prosthesis valve  placement 2009 at same time re-do CABG  . 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 Type 2 diabetes mellitus ([place])    monitored by dr Dwyane Dee     Family History  Problem Relation Age of Onset  . Heart disease Mother   . Heart disease Father   . Heart failure Father   . Diabetes Maternal Grandmother   . Diabetes Son      Social History:  reports that she has never smoked. She has never used smokeless tobacco. She reports that she does not drink alcohol or use drugs.   Exam: Current vital signs: BP (!) 107/57 (BP Location: Left Arm)   Pulse (!) 128   Temp 98 F (36.7 C) (Oral)   Resp 18   Ht 5\' 5"  (1.651 m)   Wt 87.6 kg (193 lb 3.2 oz)   SpO2 96%   BMI 32.15 kg/m  Vital signs in last 24 hours: Temp:  [97.7 F (36.5 C)-98.3 F (36.8 C)] 98 F (36.7 C) (01/20 0059) Pulse Rate:  [47-128] 128 (01/20 0059) Resp:  [13-22] 18 (01/20 0059) BP: (95-141)/(54-95) 107/57 (01/20 0059) SpO2:  [90 %-100 %] 96 % (01/20 0059) Weight:  [87.6 kg (193 lb 3.2 oz)] 87.6 kg (193 lb 3.2 oz) (01/20 0059)  Physical Exam  Constitutional: Appears well-developed and well-nourished.  Psych: Affect appropriate to situation Eyes: No scleral injection HENT: No OP obstrucion Head: Normocephalic.  Cardiovascular: Normal rate and regular rhythm.  Respiratory: Effort normal and breath sounds normal to anterior ascultation GI: Soft.  No distension. There is no tenderness.  Skin: WDI  Neuro: Mental Status: Patient is awake, alert, oriented to person, place, month, year, and situation. Patient is able to give a clear and coherent history. No signs of aphasia or neglect Cranial Nerves: II: Visual Fields are full. Pupils are equal, round, and reactive to light.   III,IV, VI: EOMI without ptosis or diploplia.  V: Facial sensation is symmetric to temperature VII: Facial movement is symmetric.  VIII: hearing is intact to voice X: Uvula elevates symmetrically XI: Shoulder shrug is  symmetric. XII: tongue is midline without atrophy or fasciculations.  Motor: Tone is normal. Bulk is normal. 5/5 strength was present in bilateral lower extermities, mild 5-/5 weakness of the left upper extremity.  Sensory: Sensation is symmetric to light touch and temperature in the arms and legs. Cerebellar: FNF are intact bilaterally  I have reviewed labs in epic and the results pertinent to this consultation are: Mildly elevated creatinien  I have reviewed the images obtained:MRI brain - no acute findings.   Impression: 71 yo F with seizures vs convulsive syncope. I would favor getting EEG prior to therapy. Following this, could consider starting   Recommendations: 1) EEG 2) Could consider starting AED following EEG, or if patient has another event.    Roland Rack, MD [facility] [phone]  If 7pm- 7am, please page neurology on call as listed in [employer].

## 2016-06-30 NOTE — Progress Notes (Signed)
Patient was shaking her hands after going to the bathroom with eyes closed lasted for 1 minute around 7:00 pm  Patient regain consciousness right after. Family members and NT with patient at that  time. Dr. Olevia Bowens and charge nurse and incoming nurse notified. Vital signs taken and recorded. Patient is resting in bed at this time. No new orders noted.

## 2016-07-01 ENCOUNTER — Inpatient Hospital Stay (HOSPITAL_COMMUNITY): Payer: Medicare Other

## 2016-07-01 DIAGNOSIS — T451X5A Adverse effect of antineoplastic and immunosuppressive drugs, initial encounter: Secondary | ICD-10-CM | POA: Diagnosis present

## 2016-07-01 DIAGNOSIS — N189 Chronic kidney disease, unspecified: Secondary | ICD-10-CM | POA: Diagnosis not present

## 2016-07-01 DIAGNOSIS — G620 Drug-induced polyneuropathy: Secondary | ICD-10-CM | POA: Diagnosis present

## 2016-07-01 DIAGNOSIS — Z951 Presence of aortocoronary bypass graft: Secondary | ICD-10-CM | POA: Diagnosis not present

## 2016-07-01 DIAGNOSIS — I252 Old myocardial infarction: Secondary | ICD-10-CM | POA: Diagnosis not present

## 2016-07-01 DIAGNOSIS — I480 Paroxysmal atrial fibrillation: Secondary | ICD-10-CM | POA: Diagnosis present

## 2016-07-01 DIAGNOSIS — Z7982 Long term (current) use of aspirin: Secondary | ICD-10-CM | POA: Diagnosis not present

## 2016-07-01 DIAGNOSIS — I2510 Atherosclerotic heart disease of native coronary artery without angina pectoris: Secondary | ICD-10-CM | POA: Diagnosis present

## 2016-07-01 DIAGNOSIS — K219 Gastro-esophageal reflux disease without esophagitis: Secondary | ICD-10-CM | POA: Diagnosis present

## 2016-07-01 DIAGNOSIS — Z7902 Long term (current) use of antithrombotics/antiplatelets: Secondary | ICD-10-CM | POA: Diagnosis not present

## 2016-07-01 DIAGNOSIS — E785 Hyperlipidemia, unspecified: Secondary | ICD-10-CM | POA: Diagnosis present

## 2016-07-01 DIAGNOSIS — R569 Unspecified convulsions: Secondary | ICD-10-CM | POA: Diagnosis present

## 2016-07-01 DIAGNOSIS — D649 Anemia, unspecified: Secondary | ICD-10-CM | POA: Diagnosis present

## 2016-07-01 DIAGNOSIS — Z853 Personal history of malignant neoplasm of breast: Secondary | ICD-10-CM | POA: Diagnosis not present

## 2016-07-01 DIAGNOSIS — I951 Orthostatic hypotension: Secondary | ICD-10-CM | POA: Diagnosis present

## 2016-07-01 DIAGNOSIS — I428 Other cardiomyopathies: Secondary | ICD-10-CM | POA: Diagnosis present

## 2016-07-01 DIAGNOSIS — N183 Chronic kidney disease, stage 3 (moderate): Secondary | ICD-10-CM | POA: Diagnosis present

## 2016-07-01 DIAGNOSIS — E2740 Unspecified adrenocortical insufficiency: Secondary | ICD-10-CM | POA: Diagnosis present

## 2016-07-01 DIAGNOSIS — E1122 Type 2 diabetes mellitus with diabetic chronic kidney disease: Secondary | ICD-10-CM | POA: Diagnosis present

## 2016-07-01 DIAGNOSIS — L409 Psoriasis, unspecified: Secondary | ICD-10-CM | POA: Diagnosis present

## 2016-07-01 DIAGNOSIS — R55 Syncope and collapse: Secondary | ICD-10-CM | POA: Diagnosis not present

## 2016-07-01 DIAGNOSIS — I499 Cardiac arrhythmia, unspecified: Secondary | ICD-10-CM | POA: Diagnosis not present

## 2016-07-01 DIAGNOSIS — I471 Supraventricular tachycardia: Secondary | ICD-10-CM | POA: Diagnosis present

## 2016-07-01 DIAGNOSIS — Z794 Long term (current) use of insulin: Secondary | ICD-10-CM | POA: Diagnosis not present

## 2016-07-01 DIAGNOSIS — I130 Hypertensive heart and chronic kidney disease with heart failure and stage 1 through stage 4 chronic kidney disease, or unspecified chronic kidney disease: Secondary | ICD-10-CM | POA: Diagnosis present

## 2016-07-01 DIAGNOSIS — H9191 Unspecified hearing loss, right ear: Secondary | ICD-10-CM | POA: Diagnosis present

## 2016-07-01 DIAGNOSIS — Z952 Presence of prosthetic heart valve: Secondary | ICD-10-CM | POA: Diagnosis not present

## 2016-07-01 DIAGNOSIS — I5032 Chronic diastolic (congestive) heart failure: Secondary | ICD-10-CM | POA: Diagnosis present

## 2016-07-01 LAB — GLUCOSE, CAPILLARY
Glucose-Capillary: 194 mg/dL — ABNORMAL HIGH (ref 65–99)
Glucose-Capillary: 170 mg/dL — ABNORMAL HIGH (ref 65–99)
Glucose-Capillary: 127 mg/dL — ABNORMAL HIGH (ref 65–99)
Glucose-Capillary: 84 mg/dL (ref 65–99)
Glucose-Capillary: 188 mg/dL — ABNORMAL HIGH (ref 65–99)

## 2016-07-01 MED ORDER — LACOSAMIDE 50 MG PO TABS
50.0000 mg | ORAL_TABLET | Freq: Two times a day (BID) | ORAL | Status: DC
  Administered 2016-07-01 – 2016-07-02 (×3): 50 mg via ORAL
  Filled 2016-07-01 (×3): qty 1

## 2016-07-01 NOTE — Progress Notes (Signed)
Subjective: Interval History: She had another possible seizure last night.  She was coming back from bathroom and had starting looking down, unresponsive, with some clonic movement of the hand ?side.  Lasted one minute. She cannot remember anything.    EEG showed mild generalized slowing.  No epileptiforms.    Objective: Vital signs in last 24 hours: Temp:  [97.6 F (36.4 C)-98.2 F (36.8 C)] 97.9 F (36.6 C) (01/21 0547) Pulse Rate:  [78-128] 78 (01/21 0547) Resp:  [16-20] 17 (01/21 0547) BP: (93-109)/(41-61) 106/47 (01/21 0547) SpO2:  [96 %-99 %] 97 % (01/21 0547)  Intake/Output from previous day: 01/20 0701 - 01/21 0700 In: 580 [P.O.:580] Out: 1600 [Urine:1600] Intake/Output this shift: No intake/output data recorded. Nutritional status: Diet heart healthy/carb modified Room service appropriate? Yes; Fluid consistency: Thin  Neurologic Exam:  Normal   Lab Results:  Recent Labs  06/29/16 1430 06/30/16 0154  WBC 9.6 8.5  HGB 12.6 11.3*  HCT 36.7 34.9*  PLT 199 179  NA 139 141  K 3.8 3.5  CL 102 103  CO2 30 30  GLUCOSE 188* 164*  BUN 38* 36*  CREATININE 1.53* 1.60*  CALCIUM 9.3 9.4   Lipid Panel No results for input(s): CHOL, TRIG, HDL, CHOLHDL, VLDL, LDLCALC in the last 72 hours.  Studies/Results: Dg Chest 2 View  Result Date: 06/29/2016 CLINICAL DATA:  Seizures today with loss of consciousness. Undergoing radiation for breast cancer. EXAM: CHEST  2 VIEW COMPARISON:  06/22/2016 and 04/25/2016. FINDINGS: The left subclavian Port-A-Cath tip appears unchanged near the cavoatrial junction. The heart size and mediastinal contours are stable post CABG and aortic valve replacement. The lungs are clear. There is no pleural effusion or pneumothorax. Old fracture of the proximal right humerus noted. No worrisome osseous findings. IMPRESSION: Stable postoperative chest. No acute findings or explanation for seizures. Electronically Signed   By: Richardean Sale M.D.   On:  06/29/2016 16:00   Ct Head Wo Contrast  Result Date: 06/29/2016 CLINICAL DATA:  Seizure-like activity today. History of breast cancer. Chemotherapy completed. Radiation therapy ongoing. EXAM: CT HEAD WITHOUT CONTRAST TECHNIQUE: Contiguous axial images were obtained from the base of the skull through the vertex without intravenous contrast. COMPARISON:  CT head 03/23/2010. FINDINGS: Brain: There is no evidence of acute intracranial hemorrhage, mass lesion, brain edema or extra-axial fluid collection. Stable mild/age-appropriate atrophy. There is no CT evidence of acute cortical infarction. Vascular: Intracranial vascular calcifications are present. Skull: Stable calvarial hyperostosis. No focal lytic or blastic lesion identified. No evidence of skull fracture. Sinuses/Orbits: The visualized paranasal sinuses and mastoid air cells are clear. No orbital abnormalities are seen. Other: None. IMPRESSION: Stable examination without acute findings or explanation for seizures. Electronically Signed   By: Richardean Sale M.D.   On: 06/29/2016 15:27   Mr Jeri Cos And Wo Contrast  Result Date: 06/29/2016 CLINICAL DATA:  Seizure at Radiation today, history of breast cancer, on chemoradiation. Seizure 3 years ago. History of diabetes, atrial fibrillation, hypertension. EXAM: MRI HEAD WITHOUT AND WITH CONTRAST TECHNIQUE: Multiplanar, multiecho pulse sequences of the brain and surrounding structures were obtained without and with intravenous contrast. CONTRAST:  58mL MULTIHANCE GADOBENATE DIMEGLUMINE 529 MG/ML IV SOLN COMPARISON:  CT HEAD June 29, 2016 at 1509 hours FINDINGS: Moderately motion degraded noncontrast examination. Post gadolinium sequences are moderate to severely motion degraded. INTRACRANIAL CONTENTS: No reduced diffusion to suggest acute ischemia or hypercellular tumor. No susceptibility artifact to suggest hemorrhage. The ventricles and sulci are normal for patient's age.  No suspicious parenchymal signal,  masses, mass effect. No abnormal intraparenchymal or extra-axial enhancement. No abnormal extra-axial fluid collections. No extra-axial masses. VASCULAR: Normal major intracranial vascular flow voids present at skull base. SKULL AND UPPER CERVICAL SPINE: No abnormal sellar expansion. No suspicious calvarial bone marrow signal. Craniocervical junction maintained. SINUSES/ORBITS: Mild paranasal sinus mucosal thickening. Mastoid air cells are well aerated. Status post bilateral ocular lens implants. The included ocular globes and orbital contents are non-suspicious. OTHER: None. IMPRESSION: Negative motion degraded MRI head with and without contrast, for age (no intracranial metastasis given moderate to severely motion degraded post gadolinium sequences). Electronically Signed   By: Elon Alas M.D.   On: 06/29/2016 21:14    Medications:  Scheduled: . aspirin EC  81 mg Oral Daily  . clopidogrel  75 mg Oral Daily  . ezetimibe  10 mg Oral Daily  . furosemide  40 mg Oral Daily   And  . furosemide  80 mg Oral Daily  . gabapentin  300 mg Oral TID  . heparin  5,000 Units Subcutaneous Q8H  . insulin pump   Subcutaneous TID AC, HS, 0200  . loratadine  10 mg Oral Daily  . metoprolol succinate  100 mg Oral BID  . mometasone-formoterol  2 puff Inhalation BID  . rosuvastatin  40 mg Oral Daily  . sodium chloride flush  3 mL Intravenous Q12H    Assessment/Plan:  Suspect focal onset seizures. Will start AED.  She will need LP with cytology for possible LM metastasis. I will do this depending on time constraints later today.     LOS: 0 days   Rogue Jury, MD 07/01/2016  8:28 AM

## 2016-07-01 NOTE — Progress Notes (Signed)
Asked by RN to see patient for a second set of eyes.  As per patient, when she sits up for a period of time she gets sharp headache in the back of her head that radiates down her neck, as soon as she lays down the headache pain goes away.  No focal deficits noted.  Patient denies headache, pain or SOB at this time.  No RRt interventions. RN to call if assistance needed

## 2016-07-01 NOTE — Progress Notes (Signed)
Patient refused to take 80 mg of Lasix at 1800 she stated she's no longer edematous compared before but she took 40 mg of Lasix this morning. MD notified. Next shift nurse notified. Family at bedside.

## 2016-07-01 NOTE — Progress Notes (Signed)
MD, pt stated that she does not take the 80 mg of Lasix at dinner at home, she takes just 40 mg daily, thanks [facility] [place].

## 2016-07-01 NOTE — Progress Notes (Signed)
Patient complaining of acute pain to the back of the neck and back of head when she sit at the edge of the bed to ate her meal but as soon as she lie down the pain is gone.MD made aware and notified. Rapid nurse notified for follow-up and for additional assessment and further management. Paged Neurologist twice. Awaiting for response. New shift nurse made aware. Patient resting at this time. Denies any pain. Patient spouse at bedside. New orders noted from Dr. Jacquelin Hawking.

## 2016-07-01 NOTE — Progress Notes (Signed)
PROGRESS NOTE  Hannah [name]  LSL:[id number] DOB: May 07, 1946  DOA: 06/29/2016 PCP: Nicola Girt, DO   Brief Narrative:  Hannah [name] is a 71 y.o. female with medical history significant for multiple comorbidities including but not limited to  Breast cancer diagnosed (S/P Right Lumpectomy with negative nodes, S/P chemotherapy, currently undergoing radiation therapy M-F, per Dr. Sondra Come), Paroxysmal Atrial Fibrillation (CHADS-Vasc score of 7, she is not anticoagulated), S/P aortic valve replacement, and CAD S/P CABG x 2, Type 2 DM (uses a disposable insulin pump; changed qHS). She was recently discharged hospital on 06/24/2016 after treatment and management for flu.  She presented with a day's h/o of an episode of seizure activity. She first had seizure in July 2013 when she "had a stroke" and was never put on AED, but did well without any seizure until last Friday when she had 3 episodes of seizure activity. Seizures are described as an episode of generalized weakness followed by bilateral upper extremity shaking lasting about a minute with loss of consciousness.  CT head and CXR were negative. Neurology was consulted, and recommended MRI as well as EEG.      Assessment & Plan:   Principal Problem:   Seizure ([place]) Active Problems:   Essential hypertension   Diabetes mellitus, type 2 (HCC)   CAD (coronary artery disease)   CKD (chronic kidney disease)   Chronic diastolic CHF (congestive heart failure) (HCC)   Chemotherapy-induced peripheral neuropathy ([place])  1. Seizure: Vimpat initiated for possible focal seizures Routine seizure precaution MRI suboptimal due to motion degraded artifacts without any obvious parenchymal lesions/ breast metastasis LP with cytology planned per Neurology to rule out leptomeningeal metastasis. EEG is abnormal with mild generalized slowing of brain activity, felt to be nonspecific but does not rule out Epilepsy(06/30/2016) Antiseizure treatment per  Neurology initiated   2. Diabetes mellitus: Hemoglobin A1c 6.4%. Follow CBGs with sliding scale insulin as needed  3. Hypertensive heart disease with chronic diastolic heart failure:/CAD s/p CABG: FollowBlood Pressures. Continue diuresis with monitoring of electrolytes.  Secondary preventative measures with antiplatelet treatment, statin  4. Paroxysmal atrial fibrillation: CHADSVASc 7 Currently in sinus rhythm. No anticoagulation.   5. Chronic kidney disease stage III: Baseline creatinine 1.4-1.6. Stable. Watch for nephrotoxic(s)  Follow clinically  6. Normocytic anemia: Suspect anemia chronic disease due to her chronic illness. Outpatient follow-up   DVT prophylaxis: Lovenox Code Status: Full code Family Communication: Son at bedside Disposition Plan: Pending   Consultants:  Neurology Procedures:  EEG with possible LP pending Antimicrobials:    Subjective: She had possible seizure activity overnight.  Objective:  Vitals:   07/01/16 0253 07/01/16 0547 07/01/16 0906 07/01/16 1001  BP: (!) 102/54 (!) 106/47  (!) 98/59  Pulse: (!) 126 78  (!) 124  Resp: 16 17    Temp: 98 F (36.7 C) 97.9 F (36.6 C)    TempSrc: Oral Oral    SpO2: 96% 97% 97% 97%  Weight:      Height:        Intake/Output Summary (Last 24 hours) at 07/01/16 1156 Last data filed at 07/01/16 0900  Gross per 24 hour  Intake              820 ml  Output             1400 ml  Net             -580 ml   Filed Weights   06/30/16 0059  Weight: 87.6 kg (193  lb 3.2 oz)    Examination:  General exam: Resting comfortably, no acute distress Respiratory system: Clear to auscultation. Respiratory effort normal. Cardiovascular system: S1 & S2 heard, RRR. No JVD, murmurs, rubs, gallops or clicks. No pedal edema. Gastrointestinal system: Abdomen is nondistended, soft and nontender. No organomegaly or masses felt. Normal bowel sounds heard. Central nervous system: Alert and oriented. No focal  neurological deficits. Extremities: Symmetric 5 x 5 power. Skin: No rashes, lesions or ulcers Psychiatry: Judgement and insight appear normal. Mood & affect appropriate.     Data Reviewed: I have personally reviewed following labs and imaging studies  CBC:  Recent Labs Lab 06/29/16 1430 06/30/16 0154  WBC 9.6 8.5  HGB 12.6 11.3*  HCT 36.7 34.9*  MCV 89.5 92.6  PLT 199 563   Basic Metabolic Panel:  Recent Labs Lab 06/29/16 1430 06/30/16 0154  NA 139 141  K 3.8 3.5  CL 102 103  CO2 30 30  GLUCOSE 188* 164*  BUN 38* 36*  CREATININE 1.53* 1.60*  CALCIUM 9.3 9.4   GFR: Estimated Creatinine Clearance: 35.7 mL/min (by C-G formula based on SCr of 1.6 mg/dL (H)). Liver Function Tests: No results for input(s): AST, ALT, ALKPHOS, BILITOT, PROT, ALBUMIN in the last 168 hours. No results for input(s): LIPASE, AMYLASE in the last 168 hours. No results for input(s): AMMONIA in the last 168 hours. Coagulation Profile: No results for input(s): INR, PROTIME in the last 168 hours. Cardiac Enzymes: No results for input(s): CKTOTAL, CKMB, CKMBINDEX, TROPONINI in the last 168 hours. BNP (last 3 results) No results for input(s): PROBNP in the last 8760 hours. HbA1C: No results for input(s): HGBA1C in the last 72 hours. CBG:  Recent Labs Lab 06/30/16 1630 06/30/16 2050 06/30/16 2144 07/01/16 0251 07/01/16 0546  GLUCAP 111* 55* 115* 194* 170*   Lipid Profile: No results for input(s): CHOL, HDL, LDLCALC, TRIG, CHOLHDL, LDLDIRECT in the last 72 hours. Thyroid Function Tests: No results for input(s): TSH, T4TOTAL, FREET4, T3FREE, THYROIDAB in the last 72 hours. Anemia Panel: No results for input(s): VITAMINB12, FOLATE, FERRITIN, TIBC, IRON, RETICCTPCT in the last 72 hours.  Sepsis Labs:  Recent Labs Lab 06/29/16 1430 06/30/16 0154  WBC 9.6 8.5    Recent Results (from the past 240 hour(s))  Urine culture     Status: Abnormal   Collection Time: 06/22/16 12:07 PM    Result Value Ref Range Status   Specimen Description URINE, CLEAN CATCH  Final   Special Requests NONE  Final   Culture >=100,000 COLONIES/mL ESCHERICHIA COLI (A)  Final   Report Status 06/25/2016 FINAL  Final   Organism ID, Bacteria ESCHERICHIA COLI (A)  Final      Susceptibility   Escherichia coli - MIC*    AMPICILLIN 4 SENSITIVE Sensitive     CEFAZOLIN <=4 SENSITIVE Sensitive     CEFTRIAXONE <=1 SENSITIVE Sensitive     CIPROFLOXACIN <=0.25 SENSITIVE Sensitive     GENTAMICIN <=1 SENSITIVE Sensitive     IMIPENEM <=0.25 SENSITIVE Sensitive     NITROFURANTOIN <=16 SENSITIVE Sensitive     TRIMETH/SULFA <=20 SENSITIVE Sensitive     AMPICILLIN/SULBACTAM <=2 SENSITIVE Sensitive     PIP/TAZO <=4 SENSITIVE Sensitive     Extended ESBL NEGATIVE Sensitive     * >=100,000 COLONIES/mL ESCHERICHIA COLI  Culture, blood (routine x 2)     Status: None   Collection Time: 06/22/16  9:08 PM  Result Value Ref Range Status   Specimen Description BLOOD BLOOD RIGHT FOREARM  Final   Special Requests BOTTLES DRAWN AEROBIC AND ANAEROBIC 5 CC  Final   Culture   Final    NO GROWTH 5 DAYS Performed at [facility], [place] [address] [place], [place] [address]    Report Status 06/28/2016 FINAL  Final  Culture, blood (routine x 2)     Status: None   Collection Time: 06/22/16  9:09 PM  Result Value Ref Range Status   Specimen Description LEFT ANTECUBITAL  Final   Special Requests BOTTLES DRAWN AEROBIC AND ANAEROBIC 5CC  Final   Culture   Final    NO GROWTH 5 DAYS Performed at [facility], [place] [address], [place], [place] [address]    Report Status 06/28/2016 FINAL  Final         Radiology Studies: Dg Chest 2 View  Result Date: 06/29/2016 CLINICAL DATA:  Seizures today with loss of consciousness. Undergoing radiation for breast cancer. EXAM: CHEST  2 VIEW COMPARISON:  06/22/2016 and 04/25/2016. FINDINGS: The left subclavian Port-A-Cath tip appears unchanged near the cavoatrial junction.  The heart size and mediastinal contours are stable post CABG and aortic valve replacement. The lungs are clear. There is no pleural effusion or pneumothorax. Old fracture of the proximal right humerus noted. No worrisome osseous findings. IMPRESSION: Stable postoperative chest. No acute findings or explanation for seizures. Electronically Signed   By: Richardean Sale M.D.   On: 06/29/2016 16:00   Ct Head Wo Contrast  Result Date: 06/29/2016 CLINICAL DATA:  Seizure-like activity today. History of breast cancer. Chemotherapy completed. Radiation therapy ongoing. EXAM: CT HEAD WITHOUT CONTRAST TECHNIQUE: Contiguous axial images were obtained from the base of the skull through the vertex without intravenous contrast. COMPARISON:  CT head 03/23/2010. FINDINGS: Brain: There is no evidence of acute intracranial hemorrhage, mass lesion, brain edema or extra-axial fluid collection. Stable mild/age-appropriate atrophy. There is no CT evidence of acute cortical infarction. Vascular: Intracranial vascular calcifications are present. Skull: Stable calvarial hyperostosis. No focal lytic or blastic lesion identified. No evidence of skull fracture. Sinuses/Orbits: The visualized paranasal sinuses and mastoid air cells are clear. No orbital abnormalities are seen. Other: None. IMPRESSION: Stable examination without acute findings or explanation for seizures. Electronically Signed   By: Richardean Sale M.D.   On: 06/29/2016 15:27   Mr Jeri Cos And Wo Contrast  Result Date: 06/29/2016 CLINICAL DATA:  Seizure at Radiation today, history of breast cancer, on chemoradiation. Seizure 3 years ago. History of diabetes, atrial fibrillation, hypertension. EXAM: MRI HEAD WITHOUT AND WITH CONTRAST TECHNIQUE: Multiplanar, multiecho pulse sequences of the brain and surrounding structures were obtained without and with intravenous contrast. CONTRAST:  31mL MULTIHANCE GADOBENATE DIMEGLUMINE 529 MG/ML IV SOLN COMPARISON:  CT HEAD June 29, 2016 at 1509 hours FINDINGS: Moderately motion degraded noncontrast examination. Post gadolinium sequences are moderate to severely motion degraded. INTRACRANIAL CONTENTS: No reduced diffusion to suggest acute ischemia or hypercellular tumor. No susceptibility artifact to suggest hemorrhage. The ventricles and sulci are normal for patient's age. No suspicious parenchymal signal, masses, mass effect. No abnormal intraparenchymal or extra-axial enhancement. No abnormal extra-axial fluid collections. No extra-axial masses. VASCULAR: Normal major intracranial vascular flow voids present at skull base. SKULL AND UPPER CERVICAL SPINE: No abnormal sellar expansion. No suspicious calvarial bone marrow signal. Craniocervical junction maintained. SINUSES/ORBITS: Mild paranasal sinus mucosal thickening. Mastoid air cells are well aerated. Status post bilateral ocular lens implants. The included ocular globes and orbital contents are non-suspicious. OTHER: None. IMPRESSION: Negative motion degraded  MRI head with and without contrast, for age (no intracranial metastasis given moderate to severely motion degraded post gadolinium sequences). Electronically Signed   By: Elon Alas M.D.   On: 06/29/2016 21:14        Scheduled Meds: . aspirin EC  81 mg Oral Daily  . clopidogrel  75 mg Oral Daily  . ezetimibe  10 mg Oral Daily  . furosemide  40 mg Oral Daily   And  . furosemide  80 mg Oral Daily  . gabapentin  300 mg Oral TID  . heparin  5,000 Units Subcutaneous Q8H  . insulin pump   Subcutaneous TID AC, HS, 0200  . lacosamide  50 mg Oral BID  . loratadine  10 mg Oral Daily  . metoprolol succinate  100 mg Oral BID  . mometasone-formoterol  2 puff Inhalation BID  . rosuvastatin  40 mg Oral Daily  . sodium chloride flush  3 mL Intravenous Q12H   Continuous Infusions:   LOS: 0 days    Time spent: 30 min    OSEI-BONSU,Kodi Steil, MD [facility] Pager [phone]  If 7PM-7AM, please contact  night-coverage [email or web address] Password [facility] 07/01/2016, 11:56 AM

## 2016-07-02 ENCOUNTER — Encounter (HOSPITAL_COMMUNITY): Payer: Self-pay | Admitting: Nurse Practitioner

## 2016-07-02 ENCOUNTER — Ambulatory Visit
Admission: RE | Admit: 2016-07-02 | Discharge: 2016-07-02 | Disposition: A | Discharge status: Home / Self Care | Payer: Medicare Other | Source: Ambulatory Visit | Attending: Radiation Oncology | Admitting: Radiation Oncology

## 2016-07-02 ENCOUNTER — Encounter: Payer: Self-pay | Admitting: Nurse Practitioner

## 2016-07-02 DIAGNOSIS — I471 Supraventricular tachycardia: Secondary | ICD-10-CM

## 2016-07-02 DIAGNOSIS — I499 Cardiac arrhythmia, unspecified: Secondary | ICD-10-CM

## 2016-07-02 DIAGNOSIS — I2510 Atherosclerotic heart disease of native coronary artery without angina pectoris: Secondary | ICD-10-CM

## 2016-07-02 DIAGNOSIS — R55 Syncope and collapse: Secondary | ICD-10-CM

## 2016-07-02 LAB — BASIC METABOLIC PANEL
Anion gap: 9 (ref 5–15)
BUN: 46 mg/dL — ABNORMAL HIGH (ref 6–20)
CO2: 29 mmol/L (ref 22–32)
Calcium: 9.2 mg/dL (ref 8.9–10.3)
Chloride: 99 mmol/L — ABNORMAL LOW (ref 101–111)
Creatinine, Ser: 1.94 mg/dL — ABNORMAL HIGH (ref 0.44–1.00)
GFR calc Af Amer: 29 mL/min — ABNORMAL LOW (ref >60)
GFR calc non Af Amer: 25 mL/min — ABNORMAL LOW (ref >60)
Glucose, Bld: 157 mg/dL — ABNORMAL HIGH (ref 65–99)
Potassium: 4.3 mmol/L (ref 3.5–5.1)
Sodium: 137 mmol/L (ref 135–145)

## 2016-07-02 LAB — CBC
HCT: 36 % (ref 36.0–46.0)
Hemoglobin: 11.6 g/dL — ABNORMAL LOW (ref 12.0–15.0)
MCH: 29.9 pg (ref 26.0–34.0)
MCHC: 32.2 g/dL (ref 30.0–36.0)
MCV: 92.8 fL (ref 78.0–100.0)
Platelets: 195 10*3/uL (ref 150–400)
RBC: 3.88 MIL/uL (ref 3.87–5.11)
RDW: 13.2 % (ref 11.5–15.5)
WBC: 8 10*3/uL (ref 4.0–10.5)

## 2016-07-02 LAB — GLUCOSE, CAPILLARY
Glucose-Capillary: 112 mg/dL — ABNORMAL HIGH (ref 65–99)
Glucose-Capillary: 109 mg/dL — ABNORMAL HIGH (ref 65–99)
Glucose-Capillary: 141 mg/dL — ABNORMAL HIGH (ref 65–99)
Glucose-Capillary: 102 mg/dL — ABNORMAL HIGH (ref 65–99)
Glucose-Capillary: 82 mg/dL (ref 65–99)

## 2016-07-02 LAB — TSH: TSH: 1.431 u[IU]/mL (ref 0.350–4.500)

## 2016-07-02 LAB — CORTISOL: Cortisol, Plasma: 6 ug/dL

## 2016-07-02 MED ORDER — METOPROLOL SUCCINATE ER 50 MG PO TB24: 50.0000 mg | ORAL_TABLET | Freq: Two times a day (BID) | ORAL | Status: DC

## 2016-07-02 MED ORDER — METOPROLOL SUCCINATE ER 50 MG PO TB24
50.0000 mg | ORAL_TABLET | Freq: Every day | ORAL | Status: DC
  Administered 2016-07-02: 50 mg via ORAL
  Filled 2016-07-02: qty 1

## 2016-07-02 MED ORDER — METHOCARBAMOL 500 MG PO TABS
500.0000 mg | ORAL_TABLET | Freq: Three times a day (TID) | ORAL | Status: DC | PRN
  Administered 2016-07-03: 500 mg via ORAL
  Filled 2016-07-02: qty 1

## 2016-07-02 MED ORDER — SOTALOL HCL 80 MG PO TABS
120.0000 mg | ORAL_TABLET | Freq: Two times a day (BID) | ORAL | Status: DC
  Administered 2016-07-02 – 2016-07-04 (×4): 120 mg via ORAL
  Filled 2016-07-02 (×4): qty 2

## 2016-07-02 MED ORDER — MIDODRINE HCL 5 MG PO TABS
5.0000 mg | ORAL_TABLET | Freq: Three times a day (TID) | ORAL | Status: DC
  Administered 2016-07-02: 5 mg via ORAL
  Filled 2016-07-02: qty 1

## 2016-07-02 NOTE — Progress Notes (Signed)
Subjective: No further seizures and tolerating Vimpat well.   Exam: Vitals:   07/02/16 0439 07/02/16 1207  BP: (!) 98/50   Pulse: 74 74  Resp: 16 18  Temp: 97.5 F (36.4 C) 98.2 F (36.8 C)        Gen: In bed, NAD MS: Alert and oriented CN: 2-12 intact Motor: moving all extremities Sensory: intaqct   Pertinent Labs/Diagnostics: MRI shows no metastasis to brain  Etta Quill PA-C [facility] [phone]  Impression: seizure    Recommendations: 1) at this time with MRI negative no need for LP 2) Continue AED at current dose 3) Per [facility] statutes, patients with seizures are not allowed to drive until  they have been seizure-free for six months. Use caution when using heavy equipment or power tools. Avoid working on ladders or at heights. Take showers instead of baths. Ensure the water temperature is not too high on the home water heater. Do not go swimming alone. When caring for infants or small children, sit down when holding, feeding, or changing them to minimize risk of injury to the child in the event you have a seizure.     07/02/2016, 3:34 PM
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
## 2016-07-02 NOTE — Progress Notes (Signed)
Canceled radiation treatment per Dr. Sondra Come because patient is an inpatient at [facility].  Notified [facility] to cancel treatment.

## 2016-07-02 NOTE — Progress Notes (Signed)
SYMPTOM MANAGEMENT CLINIC    Chief Complaint: Questionable new onset seizures  HPI:  Hannah [name] 71 y.o. female diagnosed with breast cancer.  Patient is status post chemotherapy and is currently undergoing daily radiation treatments.     Breast cancer of upper-inner quadrant of right female breast ([place])   12/14/2015 Initial Diagnosis    Right breast biopsy 12:30 position: 2 masses, 1.6 cm mass: Invasive ductal carcinoma, grade 2, ER 0%, PR 0%, HER-2 negative, Ki-67 70%; satellite mass 8 mm: IDC grade 2 ER 5%, PR 5%, HER-2 negative, Ki-67 50%; T1cN0 stage IA clinical stage      01/18/2016 Surgery    Right lumpectomy: Multifocal IDC grade 3, 1.9 cm  ER 0%, PR 0%, HER-2 negative, Ki-67 70% and 0.8 cm satellite mass ER 5%, PR 2%, HER-2 negative, Ki-67 50%, high-grade DCIS, margins negative, 0/1 lymph nodes negative T1 cN0 stage IA      02/17/2016 - 04/06/2016 Anti-estrogen oral therapy    Adjuvant chemotherapy with Taxotere and Cytoxan3 stopped early due to neuropathy, recurrent cellulitis of legs      04/08/2016 - 04/23/2016 Hospital Admission    Hosp adm for cellulitis       Review of Systems  Neurological: Positive for seizures.  All other systems reviewed and are negative.   Past Medical History:  Diagnosis Date  . Abnormally small mouth   . Anemia   . Bilateral carotid artery stenosis    Bilateral ICA 40-59%/  >50% LECA  . Breast cancer ([place])   . Breast cancer of upper-inner quadrant of right female breast ([place]) 12/16/2015  . CAD (coronary artery disease) cardiologist-  dr Aundra Dubin   Remote CABG in 1998 with redo in 2009  . CHF (congestive heart failure) (HCC)    EF is low normal at 50 to 55% per echo in Jan. 2011  . GERD (gastroesophageal reflux disease)   . Hearing loss of right ear   . History of non-ST elevation myocardial infarction (NSTEMI)    Sept 2014--  thought to be type II HTN w/ LHC without infarct related artery and patent grafts  . Hyperlipidemia   .  Hypertension   . Iron deficiency anemia   . Moderate persistent asthma    pulmologist-  Dr. Malvin Johns  . Nonischemic cardiomyopathy ([place])   . PAF (paroxysmal atrial fibrillation) ([place])   . Psoriasis    right leg  . Renal calculus, right   . Renal insufficiency, mild   . S/P AVR    prosthesis valve placement 2009 at same time re-do CABG  . Stroke [facility])    residual rt hearing loss  . Type 2 diabetes mellitus ([place])    monitored by dr Dwyane Dee    Past Surgical History:  Procedure Laterality Date  . AORTIC VALVE REPLACEMENT  2009   [facility] (done same time is CABG)  . BREAST LUMPECTOMY WITH RADIOACTIVE SEED AND SENTINEL LYMPH NODE BIOPSY Right 01/18/2016   Procedure: RIGHT BREAST LUMPECTOMY WITH RADIOACTIVE SEED AND SENTINEL LYMPH NODE BIOPSY;  Surgeon: DAlphonsa Overall MD;  [place]  Service: General;  Laterality: Right;  . CARDIAC CATHETERIZATION  03/23/2008   Pre-redo CABG: L main OK, LAD (T), CFX (T), OM1 99%, RCA (T), LIMA-LAD OK, SVG-OM(?3) OK w/ little florw to OM2, SVG-RCA OK. EF NL  . CARPAL TUNNEL RELEASE    . COLONOSCOPY    . CORONARY ARTERY BYPASS GRAFT  1998 &  re-do 2009   Had  LIMA to DX/LAD, SVG to 2 marginal branches and SVG to [facility] originally; SVG to 3rd OM and PD at time of redo  . CYSTOSCOPY W/ URETERAL STENT PLACEMENT Right 12/20/2014   Procedure: CYSTOSCOPY WITH RETROGRADE PYELOGRAM/URETERAL STENT PLACEMENT;  Surgeon: Cleon Gustin, MD;  Location[facility];  Service: Urology;  Laterality: Right;  . EYE SURGERY Bilateral    cataracts  . HOLMIUM LASER APPLICATION Right 5/63/8937   Procedure:  HOLMIUM LASER LITHOTRIPSY;  Surgeon: Cleon Gustin, MD;  Location[facility];  Service: Urology;  Laterality: Right;  . LEFT HEART CATHETERIZATION WITH CORONARY/GRAFT ANGIOGRAM N/A 02/23/2013   Procedure: LEFT HEART CATHETERIZATION WITH Beatrix Fetters;  Surgeon: Blane Ohara, MD;  Location: [facility]  CATH LAB;  Service: Cardiovascular;  Laterality: N/A;  . PORTACATH PLACEMENT Left 01/18/2016   Procedure: INSERTION PORT-A-CATH;  Surgeon: Alphonsa Overall, MD;  Location: [place];  Service: General;  Laterality: Left;  . TONSILLECTOMY    . TRANSTHORACIC ECHOCARDIOGRAM  02-24-2013      mild LVH,  ef 50-55%/  AV bioprosthesis was present with very mild stenosis and no regurg., mean grandient 27mHg, peak grandient 284mg /  mild MR/  mild LAE and RAE/  moderate TR  . TUBAL LIGATION      has Essential hypertension; Atrial fibrillation ([facility] Allergic rhinitis; Asthma; Hyperlipidemia; SOB (shortness of breath); Renal insufficiency, mild; Nonischemic cardiomyopathy ([facility] Anemia; Diabetes mellitus, type 2 ([place] Iron deficiency anemia; S/P aortic valve replacement; CAD (coronary artery disease); Non-ST elevation myocardial infarction (NSTEMI), [facility] of [facility] ([facility] Atrial tachycardia ([facility] Unstable angina (HCC); CKD (chronic kidney disease); Chronic diastolic CHF (congestive heart failure) ([facility] Breast cancer of upper-inner quadrant of right female breast ([facility] Port catheter in place; Chemotherapy induced nausea and vomiting; GERD (gastroesophageal reflux disease); [place]ia ([facility] Antineoplastic chemotherapy induced anemia; Chemotherapy-induced thrombocytopenia; Osteomyelitis of toe of left foot ([facility] Chemotherapy-induced peripheral neuropathy (HCNash[place] Influenza A; Syncope; and Seizure (HCL[place] her problem list.    is allergic to amoxicillin; imdur [isosorbide dinitrate]; latex; and tetracycline.  Allergies as of 06/29/2016      Reactions   Amoxicillin Rash   Has patient had a PCN reaction causing immediate rash, facial/tongue/throat swelling, SOB or lightheadedness with hypotension: No Has patient had a PCN reaction causing severe rash involving mucus membranes or skin necrosis: Yes Has patient had a PCN reaction that required hospitalization No Has patient had a PCN reaction occurring within the last  10 years: No If all of the above answers are "NO", then may proceed with Cephalosporin use.   Imdur [isosorbide Dinitrate] Other (See Comments)   Headache/severe hypotension/Syncope   Latex Itching   Tetracycline Rash      Medication List       Accurate as of 06/29/16  1:50 PM. Always use your most recent med list.          albuterol 108 (90 Base) MCG/ACT inhaler Commonly known as:  PROVENTIL HFA;VENTOLIN HFA Inhale 2 puffs into the lungs every 6 (six) hours as needed for wheezing or shortness of breath.   alendronate 70 MG tablet Commonly known as:  FOSAMAX Take 70 mg by mouth every Wednesday. Take with a full glass of water on an empty stomach.   aspirin 81 MG EC tablet Take 1 tablet (81 mg total) by mouth daily.   budesonide-formoterol 160-4.5 MCG/ACT inhaler Commonly known as:  SYMBICORT INHALE TWO PUFFS INTO LUNGS TWICE DAILY   clopidogrel 75 MG tablet Commonly known as:  PLAVIX Take 75 mg by mouth daily.   furosemide 80 MG tablet Commonly known as:  LASIX Take 1 tablet (80 mg total) by mouth daily. 1 tablet in the AM and 1/2 tablet (40 mg) in the early afternoon.   gabapentin 300 MG capsule Commonly known as:  NEURONTIN Take 1 capsule (300 mg total) by mouth 3 (three) times daily.   insulin lispro 100 UNIT/ML KiwkPen Commonly known as:  HUMALOG KWIKPEN INJECT 10 UNITS INTO THE SKIN WITH MEALS PLUS SLIDING SCALE   insulin lispro 100 UNIT/ML injection Commonly known as:  HUMALOG Inject 76 units daily with  V-GO pump   ipratropium-albuterol 0.5-2.5 (3) MG/3ML Soln Commonly known as:  DUONEB Take 3 mLs by nebulization 4 (four) times daily.   lidocaine-prilocaine cream Commonly known as:  EMLA Apply to affected area once   loratadine 10 MG tablet Commonly known as:  CLARITIN Take 10 mg by mouth daily.   metoprolol succinate 100 MG 24 hr tablet Commonly known as:  TOPROL-XL Take 1 tablet (100 mg total) by mouth 2 (two) times daily. Take with or  immediately following a meal.   potassium chloride SA 20 MEQ tablet Commonly known as:  KLOR-CON M20 Take 1 tablet (20 mEq total) by mouth 2 (two) times daily.   rosuvastatin 40 MG tablet Commonly known as:  CRESTOR TAKE ONE TABLET BY MOUTH ONCE DAILY   TRULICITY 0.92 ZR/0.0TM Sopn Generic drug:  Dulaglutide Inject 0.75 mg into the skin every Wednesday.   V-GO 40 Kit USE 1 PER DAY   ZETIA 10 MG tablet Generic drug:  ezetimibe Take 1 tablet by mouth  daily        PHYSICAL EXAMINATION  Oncology Vitals 07/02/2016 07/02/2016  Height - -  Weight - 87.363 kg  Weight (lbs) - 192 lbs 10 oz  BMI (kg/m2) - 32.05 kg/m2  Temp 98.2 97.5  Pulse 74 74  Resp 18 16  SpO2 99 99  BSA (m2) - 2 m2   BP Readings from Last 2 Encounters:  07/02/16 (!) 98/50  06/29/16 110/62    Physical Exam  Constitutional: She is oriented to person, place, and time and well-developed, well-nourished, and in no distress.  HENT:  Head: Normocephalic and atraumatic.  Mouth/Throat: Oropharynx is clear and moist.  Eyes: Conjunctivae and EOM are normal. Pupils are equal, round, and reactive to light. Right eye exhibits no discharge. Left eye exhibits no discharge. No scleral icterus.  Neck: Normal range of motion. Neck supple. No JVD present. No tracheal deviation present. No thyromegaly present.  Cardiovascular: Normal rate, regular rhythm, normal heart sounds and intact distal pulses.   Pulmonary/Chest: Effort normal and breath sounds normal. No respiratory distress. She has no wheezes. She has no rales. She exhibits no tenderness.  Abdominal: Soft. Bowel sounds are normal. She exhibits no distension and no mass. There is no tenderness. There is no rebound and no guarding.  Musculoskeletal: Normal range of motion. She exhibits no edema or tenderness.  Lymphadenopathy:    She has no cervical adenopathy.  Neurological: She is alert and oriented to person, place, and time. Gait normal.  Skin: Skin is warm and  dry. No rash noted. No erythema. No pallor.  Psychiatric: Affect normal.  Nursing note and vitals reviewed.   LABORATORY DATA:. Admission on 06/29/2016  Component Date Value Ref Range Status  . Glucose-Capillary 06/29/2016 206* 65 - 99 mg/dL Final  . Glucose-Capillary 06/29/2016 180* 65 - 99 mg/dL Final  . Sodium 06/29/2016 139  135 - 145 mmol/L Final  . Potassium 06/29/2016 3.8  3.5 - 5.1 mmol/L Final  . Chloride 06/29/2016 102  101 - 111 mmol/L Final  . CO2 06/29/2016 30  22 - 32 mmol/L Final  . Glucose, Bld 06/29/2016 188* 65 - 99 mg/dL Final  . BUN 06/29/2016 38* 6 - 20 mg/dL Final  . Creatinine, Ser 06/29/2016 1.53* 0.44 - 1.00 mg/dL Final  . Calcium 06/29/2016 9.3  8.9 - 10.3 mg/dL Final  . GFR calc non Af Amer 06/29/2016 33* >60 mL/min Final  . GFR calc Af Amer 06/29/2016 39* >60 mL/min Final   Comment: (NOTE) The eGFR has been calculated using the CKD EPI equation. This calculation has not been validated in all clinical situations. eGFR's persistently <60 mL/min signify possible Chronic Kidney Disease.   . Anion gap 06/29/2016 7  5 - 15 Final  . WBC 06/29/2016 9.6  4.0 - 10.5 K/uL Final  . RBC 06/29/2016 4.10  3.87 - 5.11 MIL/uL Final  . Hemoglobin 06/29/2016 12.6  12.0 - 15.0 g/dL Final  . HCT 06/29/2016 36.7  36.0 - 46.0 % Final  . MCV 06/29/2016 89.5  78.0 - 100.0 fL Final  . MCH 06/29/2016 30.7  26.0 - 34.0 pg Final  . MCHC 06/29/2016 34.3  30.0 - 36.0 g/dL Final  . RDW 06/29/2016 13.0  11.5 - 15.5 % Final  . Platelets 06/29/2016 199  150 - 400 K/uL Final  . WBC 06/30/2016 8.5  4.0 - 10.5 K/uL Final  . RBC 06/30/2016 3.77* 3.87 - 5.11 MIL/uL Final  . Hemoglobin 06/30/2016 11.3* 12.0 - 15.0 g/dL Final  . HCT 06/30/2016 34.9* 36.0 - 46.0 % Final  . MCV 06/30/2016 92.6  78.0 - 100.0 fL Final  . MCH 06/30/2016 30.0  26.0 - 34.0 pg Final  . MCHC 06/30/2016 32.4  30.0 - 36.0 g/dL Final  . RDW 06/30/2016 13.5  11.5 - 15.5 % Final  . Platelets 06/30/2016 179  150 - 400  K/uL Final  . Sodium 06/30/2016 141  135 - 145 mmol/L Final  . Potassium 06/30/2016 3.5  3.5 - 5.1 mmol/L Final  . Chloride 06/30/2016 103  101 - 111 mmol/L Final  . CO2 06/30/2016 30  22 - 32 mmol/L Final  . Glucose, Bld 06/30/2016 164* 65 - 99 mg/dL Final  . BUN 06/30/2016 36* 6 - 20 mg/dL Final  . Creatinine, Ser 06/30/2016 1.60* 0.44 - 1.00 mg/dL Final  . Calcium 06/30/2016 9.4  8.9 - 10.3 mg/dL Final  . GFR calc non Af Amer 06/30/2016 32* >60 mL/min Final  . GFR calc Af Amer 06/30/2016 37* >60 mL/min Final   Comment: (NOTE) The eGFR has been calculated using the CKD EPI equation. This calculation has not been validated in all clinical situations. eGFR's persistently <60 mL/min signify possible Chronic Kidney Disease.   . Anion gap 06/30/2016 8  5 - 15 Final  . Glucose-Capillary 06/30/2016 92  65 - 99 mg/dL Final  . Comment 1 06/30/2016 Notify RN   Final  . Comment 2 06/30/2016 Document in Chart   Final  . Glucose-Capillary 06/30/2016 101* 65 - 99 mg/dL Final  . Glucose-Capillary 06/30/2016 180* 65 - 99 mg/dL Final  . Comment 1 06/30/2016 Notify RN   Final  . Comment 2 06/30/2016 Document in Chart   Final  . Glucose-Capillary 06/30/2016 111* 65 - 99 mg/dL Final  . Comment 1 06/30/2016 Notify RN   Final  . Comment 2 06/30/2016 Document in  Chart   Final  . Glucose-Capillary 06/30/2016 55* 65 - 99 mg/dL Final  . Comment 1 06/30/2016 Notify RN   Final  . Comment 2 06/30/2016 Document in Chart   Final  . Glucose-Capillary 06/30/2016 115* 65 - 99 mg/dL Final  . Comment 1 06/30/2016 Notify RN   Final  . Comment 2 06/30/2016 Document in Chart   Final  . Glucose-Capillary 07/01/2016 194* 65 - 99 mg/dL Final  . Comment 1 07/01/2016 Notify RN   Final  . Comment 2 07/01/2016 Document in Chart   Final  . Glucose-Capillary 07/01/2016 170* 65 - 99 mg/dL Final  . Comment 1 07/01/2016 Notify RN   Final  . Comment 2 07/01/2016 Document in Chart   Final  . Glucose-Capillary 07/01/2016 127*  65 - 99 mg/dL Final  . Comment 1 07/01/2016 Notify RN   Final  . Comment 2 07/01/2016 Document in Chart   Final  . Glucose-Capillary 07/01/2016 84  65 - 99 mg/dL Final  . Comment 1 07/01/2016 Notify RN   Final  . Comment 2 07/01/2016 Document in Chart   Final  . Glucose-Capillary 07/01/2016 188* 65 - 99 mg/dL Final  . Comment 1 07/01/2016 Notify RN   Final  . Comment 2 07/01/2016 Document in Chart   Final  . Glucose-Capillary 07/02/2016 112* 65 - 99 mg/dL Final  . Comment 1 07/02/2016 Notify RN   Final  . Comment 2 07/02/2016 Document in Chart   Final  . Glucose-Capillary 07/02/2016 109* 65 - 99 mg/dL Final  . Comment 1 07/02/2016 Notify RN   Final  . Comment 2 07/02/2016 Document in Chart   Final  . WBC 07/02/2016 8.0  4.0 - 10.5 K/uL Final  . RBC 07/02/2016 3.88  3.87 - 5.11 MIL/uL Final  . Hemoglobin 07/02/2016 11.6* 12.0 - 15.0 g/dL Final  . HCT 07/02/2016 36.0  36.0 - 46.0 % Final  . MCV 07/02/2016 92.8  78.0 - 100.0 fL Final  . MCH 07/02/2016 29.9  26.0 - 34.0 pg Final  . MCHC 07/02/2016 32.2  30.0 - 36.0 g/dL Final  . RDW 07/02/2016 13.2  11.5 - 15.5 % Final  . Platelets 07/02/2016 195  150 - 400 K/uL Final  . Sodium 07/02/2016 137  135 - 145 mmol/L Final  . Potassium 07/02/2016 4.3  3.5 - 5.1 mmol/L Final  . Chloride 07/02/2016 99* 101 - 111 mmol/L Final  . CO2 07/02/2016 29  22 - 32 mmol/L Final  . Glucose, Bld 07/02/2016 157* 65 - 99 mg/dL Final  . BUN 07/02/2016 46* 6 - 20 mg/dL Final  . Creatinine, Ser 07/02/2016 1.94* 0.44 - 1.00 mg/dL Final  . Calcium 07/02/2016 9.2  8.9 - 10.3 mg/dL Final  . GFR calc non Af Amer 07/02/2016 25* >60 mL/min Final  . GFR calc Af Amer 07/02/2016 29* >60 mL/min Final   Comment: (NOTE) The eGFR has been calculated using the CKD EPI equation. This calculation has not been validated in all clinical situations. eGFR's persistently <60 mL/min signify possible Chronic Kidney Disease.   . Anion gap 07/02/2016 9  5 - 15 Final  .  Glucose-Capillary 07/02/2016 141* 65 - 99 mg/dL Final  . Comment 1 07/02/2016 Notify RN   Final  . Glucose-Capillary 07/02/2016 102* 65 - 99 mg/dL Final  . Comment 1 07/02/2016 Notify RN   Final    RADIOGRAPHIC STUDIES: Dg Chest 2 View  Result Date: 06/29/2016 CLINICAL DATA:  Seizures today with loss of consciousness.  Undergoing radiation for breast cancer. EXAM: CHEST  2 VIEW COMPARISON:  06/22/2016 and 04/25/2016. FINDINGS: The left subclavian Port-A-Cath tip appears unchanged near the cavoatrial junction. The heart size and mediastinal contours are stable post CABG and aortic valve replacement. The lungs are clear. There is no pleural effusion or pneumothorax. Old fracture of the proximal right humerus noted. No worrisome osseous findings. IMPRESSION: Stable postoperative chest. No acute findings or explanation for seizures. Electronically Signed   By: Richardean Sale M.D.   On: 06/29/2016 16:00   Dg Cervical Spine 2 Or 3 Views  Result Date: 07/02/2016 CLINICAL DATA:  Headache.  Pain.  No injury. EXAM: CERVICAL SPINE - 2-3 VIEW COMPARISON:  MRI 06/29/2016.  CT 06/29/2016. FINDINGS: Diffuse osteopenia. No acute soft tissue bony abnormality. Ligamentous ossification noted. Lower cervical spine poorly visualized. Left subclavian line noted. Prior median sternotomy and CABG. Bilateral carotid vascular calcification IMPRESSION: 1. Diffuse osteopenia and degenerative change. Lower cervical spine difficult visualized. No acute bony abnormality. 2.  Bilateral carotid vascular disease . Electronically Signed   By: Marcello Moores  Register   On: 07/02/2016 06:58   Ct Head Wo Contrast  Result Date: 06/29/2016 CLINICAL DATA:  Seizure-like activity today. History of breast cancer. Chemotherapy completed. Radiation therapy ongoing. EXAM: CT HEAD WITHOUT CONTRAST TECHNIQUE: Contiguous axial images were obtained from the base of the skull through the vertex without intravenous contrast. COMPARISON:  CT head 03/23/2010.  FINDINGS: Brain: There is no evidence of acute intracranial hemorrhage, mass lesion, brain edema or extra-axial fluid collection. Stable mild/age-appropriate atrophy. There is no CT evidence of acute cortical infarction. Vascular: Intracranial vascular calcifications are present. Skull: Stable calvarial hyperostosis. No focal lytic or blastic lesion identified. No evidence of skull fracture. Sinuses/Orbits: The visualized paranasal sinuses and mastoid air cells are clear. No orbital abnormalities are seen. Other: None. IMPRESSION: Stable examination without acute findings or explanation for seizures. Electronically Signed   By: Richardean Sale M.D.   On: 06/29/2016 15:27   Mr Jeri Cos And Wo Contrast  Result Date: 06/29/2016 CLINICAL DATA:  Seizure at Radiation today, history of breast cancer, on chemoradiation. Seizure 3 years ago. History of diabetes, atrial fibrillation, hypertension. EXAM: MRI HEAD WITHOUT AND WITH CONTRAST TECHNIQUE: Multiplanar, multiecho pulse sequences of the brain and surrounding structures were obtained without and with intravenous contrast. CONTRAST:  42m MULTIHANCE GADOBENATE DIMEGLUMINE 529 MG/ML IV SOLN COMPARISON:  CT HEAD June 29, 2016 at 1509 hours FINDINGS: Moderately motion degraded noncontrast examination. Post gadolinium sequences are moderate to severely motion degraded. INTRACRANIAL CONTENTS: No reduced diffusion to suggest acute ischemia or hypercellular tumor. No susceptibility artifact to suggest hemorrhage. The ventricles and sulci are normal for patient's age. No suspicious parenchymal signal, masses, mass effect. No abnormal intraparenchymal or extra-axial enhancement. No abnormal extra-axial fluid collections. No extra-axial masses. VASCULAR: Normal major intracranial vascular flow voids present at skull base. SKULL AND UPPER CERVICAL SPINE: No abnormal sellar expansion. No suspicious calvarial bone marrow signal. Craniocervical junction maintained.  SINUSES/ORBITS: Mild paranasal sinus mucosal thickening. Mastoid air cells are well aerated. Status post bilateral ocular lens implants. The included ocular globes and orbital contents are non-suspicious. OTHER: None. IMPRESSION: Negative motion degraded MRI head with and without contrast, for age (no intracranial metastasis given moderate to severely motion degraded post gadolinium sequences). Electronically Signed   By: CElon AlasM.D.   On: 06/29/2016 21:14    ASSESSMENT/PLAN:    Seizure ([facility] Patient was being wheeled down to radiation oncology for her daily radiation treatments:  When she experienced a witnessed possible seizure.  I reserved state that patient was noted to have shaking of her hands and was unable to speak.  Patient also vomited during this episode as well.  Patient recovered from the seizure-like activity; but then had no memory of the event.  The nurse reviewed all with Dr. Isidore Moos radiation oncologist; who advised that this provider then be called for further assessment.  Apparently the patient had another episode of nonresponsiveness with her head slumped while she was in the wheelchair.  This provider arrived on the scene; 2.  The patient essentially alert and oriented.  She was able to follow all commands and only complaining of a mild headache.  She does not remember either of these episodes that it just happened.  Vital signs were essentially stable and patient was afebrile when checked.  Rapid response team was called due to possible new onset seizure activity. Patient was transported then to the emergency department.  CODE STATUS: Patient should be considered a full code; since there are no advanced directives in patient's chart.  Breast cancer of upper-inner quadrant of right female breast [facility]) Patient last received chemotherapy in November 2017.  She continues to undergo daily radiation treatments for treatment of her breast cancer.  She is scheduled for  follow-up visit with Dr. Lindi Adie on 07/11/2016.   Patient stated understanding of all instructions; and was in agreement with this plan of care. The patient knows to call the clinic with any problems, questions or concerns.   Total time spent with patient was 25 minutes;  with greater than 75 percent of that time spent in face to face counseling regarding patient's symptoms,  and coordination of care and follow up.  Disclaimer:This dictation was prepared with Dragon/digital dictation along with [employer]. Any transcriptional errors that result from this process are unintentional.  Drue Second, NP 07/02/2016

## 2016-07-02 NOTE — Assessment & Plan Note (Signed)
Patient last received chemotherapy in November 2017.  She continues to undergo daily radiation treatments for treatment of her breast cancer.  She is scheduled for follow-up visit with Dr. Lindi Adie on 07/11/2016.

## 2016-07-02 NOTE — Consult Note (Signed)
ELECTROPHYSIOLOGY CONSULT NOTE    Patient ID: Hannah [name] MRN: [id number], DOB/AGE: 71-Sep-1947 71 y.o.  Admit date: 06/29/2016 Date of Consult: 07/02/2016  Primary Physician: Nicola Girt, DO Electrophysiologist: Leamon Arnt MD: Johnsie Cancel  Reason for Consultation: atrial tachycardia   HPI:  Hannah [name] is a 71 y.o. female with a past medical history significant for breast cancer (currently undergoing XRT), CAD s/p CABG, atrial tachycardia, diabetes, prior TIA.  She was admitted on 06/29/16 with episodes of unresponsiveness.  She has been evaluated during the episodes and had normal blood pressure and heart rate. She does have palpitations at times but these do not correlate well with her atrial tachycardia. She has known structural heart disease with prior AVR and CABG. She has been started on seizure medications.  Echo 03/2016 demonstrated EF 62-86%, grade 2 diastolic dysfunction, PA pressure 38, LA 48.  She currently denies chest pain. She had been on high dose beta blocker therapy and this has been reduced.  Past Medical History:  Diagnosis Date  . Abnormally small mouth   . Anemia   . Bilateral carotid artery stenosis    Bilateral ICA 40-59%/  >50% LECA  . Breast cancer ([place])   . Breast cancer of upper-inner quadrant of right female breast ([place]) 12/16/2015  . CAD (coronary artery disease) cardiologist-  dr Aundra Dubin   Remote CABG in 1998 with redo in 2009  . CHF (congestive heart failure) (HCC)    EF is low normal at 50 to 55% per echo in Jan. 2011  . GERD (gastroesophageal reflux disease)   . Hearing loss of right ear   . History of non-ST elevation myocardial infarction (NSTEMI)    Sept 2014--  thought to be type II HTN w/ LHC without infarct related artery and patent grafts  . Hyperlipidemia   . Hypertension   . Iron deficiency anemia   . Moderate persistent asthma    pulmologist-  Dr. Malvin Johns  . Nonischemic cardiomyopathy ([place])   . PAF (paroxysmal atrial  fibrillation) ([place])   . Psoriasis    right leg  . Renal calculus, right   . Renal insufficiency, mild   . S/P AVR    prosthesis valve placement 2009 at same time re-do CABG  . Stroke [facility])    residual rt hearing loss  . Type 2 diabetes mellitus ([place])    monitored by dr Dwyane Dee     Surgical History:  Past Surgical History:  Procedure Laterality Date  . AORTIC VALVE REPLACEMENT  2009   [facility] (done same time is CABG)  . BREAST LUMPECTOMY WITH RADIOACTIVE SEED AND SENTINEL LYMPH NODE BIOPSY Right 01/18/2016   Procedure: RIGHT BREAST LUMPECTOMY WITH RADIOACTIVE SEED AND SENTINEL LYMPH NODE BIOPSY;  Surgeon: DAlphonsa Overall MD;  [place]  Service: General;  Laterality: Right;  . CARDIAC CATHETERIZATION  03/23/2008   Pre-redo CABG: L main OK, LAD (T), CFX (T), OM1 99%, RCA (T), LIMA-LAD OK, SVG-OM(?3) OK w/ little florw to OM2, SVG-RCA OK. EF NL  . CARPAL TUNNEL RELEASE    . COLONOSCOPY    . CORONARY ARTERY BYPASS GRAFT  1998 &  re-do 2009   Had LIMA to DX/LAD, SVG to 2 marginal branches and [facility]; SVG to 3rd OM and PD at time of redo  . CYSTOSCOPY W/ URETERAL STENT PLACEMENT Right 12/20/2014   Procedure: CYSTOSCOPY WITH RETROGRADE PYELOGRAM/URETERAL STENT PLACEMENT;  Surgeon: PCleon Gustin MD;  [place];  Service: Urology;  Laterality: Right;  . EYE SURGERY Bilateral    cataracts  . HOLMIUM LASER APPLICATION Right 0/08/4915   Procedure:  HOLMIUM LASER LITHOTRIPSY;  Surgeon: Cleon Gustin, MD;  Location: [facility];  Service: Urology;  Laterality: Right;  . LEFT HEART CATHETERIZATION WITH CORONARY/GRAFT ANGIOGRAM N/A 02/23/2013   Procedure: LEFT HEART CATHETERIZATION WITH Beatrix Fetters;  Surgeon: Blane Ohara, MD;  Location: [facility] CATH LAB;  Service: Cardiovascular;  Laterality: N/A;  . PORTACATH PLACEMENT Left 01/18/2016   Procedure: INSERTION PORT-A-CATH;  Surgeon: Alphonsa Overall, MD;  Location: [place];  Service: General;  Laterality: Left;  . TONSILLECTOMY    . TRANSTHORACIC ECHOCARDIOGRAM  02-24-2013      mild LVH,  ef 50-55%/  AV bioprosthesis was present with very mild stenosis and no regurg., mean grandient 15mHg, peak grandient 243mg /  mild MR/  mild LAE and RAE/  moderate TR  . TUBAL LIGATION       Prescriptions Prior to Admission  Medication Sig Dispense Refill Last Dose  . albuterol (PROVENTIL HFA;VENTOLIN HFA) 108 (90 Base) MCG/ACT inhaler Inhale 2 puffs into the lungs every 6 (six) hours as needed for wheezing or shortness of breath.   Past Month at Unknown time  . alendronate (FOSAMAX) 70 MG tablet Take 70 mg by mouth every Wednesday. Take with a full glass of water on an empty stomach.    Taking  . aspirin EC 81 MG EC tablet Take 1 tablet (81 mg total) by mouth daily.   06/29/2016 at Unknown time  . budesonide-formoterol (SYMBICORT) 160-4.5 MCG/ACT inhaler INHALE TWO PUFFS INTO LUNGS TWICE DAILY 1 Inhaler 11 06/29/2016 at Unknown time  . clopidogrel (PLAVIX) 75 MG tablet Take 75 mg by mouth daily.   06/29/2016 at Unknown time  . furosemide (LASIX) 80 MG tablet Take 1 tablet (80 mg total) by mouth daily. 1 tablet in the AM and 1/2 tablet (40 mg) in the early afternoon. (Patient taking differently: Take 40-80 mg by mouth 2 (two) times daily. Take 80 mg in the morning and then 4083mn the afternoon, if pt is going to be out during the day the pt takes 17m56m the am and then 80mg41mn she gets back home) 90 tablet 3 06/29/2016 at Unknown time  . gabapentin (NEURONTIN) 300 MG capsule Take 1 capsule (300 mg total) by mouth 3 (three) times daily. 90 capsule 3 06/29/2016 at Unknown time  . insulin lispro (HUMALOG KWIKPEN) 100 UNIT/ML KiwkPen INJECT 10 UNITS INTO THE SKIN WITH MEALS PLUS SLIDING SCALE (Patient taking differently: Inject 10-12 Units into the skin daily as needed. per SLIDING SCALE) 15 mL 2 06/29/2016 at Unknown time  . insulin lispro (HUMALOG) 100  UNIT/ML injection Inject 76 units daily with  V-GO pump 70 mL 2 06/29/2016 at Unknown time  . ipratropium-albuterol (DUONEB) 0.5-2.5 (3) MG/3ML SOLN Take 3 mLs by nebulization 4 (four) times daily. 360 mL 0 Past Week at Unknown time  . loratadine (CLARITIN) 10 MG tablet Take 10 mg by mouth daily.   06/29/2016 at Unknown time  . metoprolol succinate (TOPROL-XL) 100 MG 24 hr tablet Take 1 tablet (100 mg total) by mouth 2 (two) times daily. Take with or immediately following a meal. 60 tablet 10 06/29/2016 at 0945  . potassium chloride SA (KLOR-CON M20) 20 MEQ tablet Take 1 tablet (20 mEq total) by mouth 2 (two) times daily. (Patient taking differently: Take 20 mEq by mouth  2 (two) times daily as needed (low potassium). ) 60 tablet 3 Past Week at Unknown time  . rosuvastatin (CRESTOR) 40 MG tablet TAKE ONE TABLET BY MOUTH ONCE DAILY (Patient taking differently: TAKE 90m TABLET BY MOUTH ONCE DAILY) 30 tablet 11 06/29/2016 at Unknown time  . TRULICITY 02.67MTI/4.5YKSOPN Inject 0.75 mg into the skin every Wednesday.    Taking  . ZETIA 10 MG tablet Take 1 tablet by mouth  daily (Patient taking differently: Take 113mby mouth once daily) 90 tablet 2 06/29/2016 at Unknown time  . Insulin Disposable Pump (V-GO 40) KIT USE 1 PER DAY 90 kit 1 Taking  . lidocaine-prilocaine (EMLA) cream Apply to affected area once (Patient taking differently: Apply 1 application topically daily as needed (port access). Apply to affected area once) 30 g 3 Taking    Inpatient Medications:  . aspirin EC  81 mg Oral Daily  . clopidogrel  75 mg Oral Daily  . ezetimibe  10 mg Oral Daily  . gabapentin  300 mg Oral TID  . heparin  5,000 Units Subcutaneous Q8H  . insulin pump   Subcutaneous TID AC, HS, 0200  . lacosamide  50 mg Oral BID  . loratadine  10 mg Oral Daily  . midodrine  5 mg Oral TID WC  . mometasone-formoterol  2 puff Inhalation BID  . rosuvastatin  40 mg Oral Daily  . sodium chloride flush  3 mL Intravenous Q12H     Allergies:  Allergies  Allergen Reactions  . Amoxicillin Rash    Has patient had a PCN reaction causing immediate rash, facial/tongue/throat swelling, SOB or lightheadedness with hypotension: No Has patient had a PCN reaction causing severe rash involving mucus membranes or skin necrosis: Yes Has patient had a PCN reaction that required hospitalization No Has patient had a PCN reaction occurring within the last 10 years: No If all of the above answers are "NO", then may proceed with Cephalosporin use.   . Imdur [Isosorbide Dinitrate] Other (See Comments)    Headache/severe hypotension/Syncope  . Latex Itching  . Tetracycline Rash    Social History   Social History  . Marital status: Married    Spouse name: N/A  . Number of children: 2  . Years of education: N/A   Occupational History  . retired    Social History Main Topics  . Smoking status: Never Smoker  . Smokeless tobacco: Never Used  . Alcohol use No  . Drug use: No  . Sexual activity: Yes    Birth control/ protection: None   Other Topics Concern  . Not on file   Social History Narrative  . No narrative on file     Family History  Problem Relation Age of Onset  . Heart disease Mother   . Heart disease Father   . Heart failure Father   . Diabetes Maternal Grandmother   . Diabetes Son      Review of Systems: All other systems reviewed and are otherwise negative except as noted above.  Physical Exam: Vitals:   07/01/16 2045 07/01/16 2125 07/02/16 0439 07/02/16 1207  BP: (!) 106/59  (!) 98/50   Pulse: 73  74 74  Resp: 18  16 18   Temp: 98.1 F (36.7 C)  97.5 F (36.4 C) 98.2 F (36.8 C)  TempSrc: Oral  Oral Oral  SpO2: 100% 99% 99% 99%  Weight:   192 lb 9.6 oz (87.4 kg)   Height:  GEN- The patient is well appearing, alert and oriented x 3 today.   HEENT: normocephalic, atraumatic; sclera clear, conjunctiva pink; hearing intact; oropharynx clear; neck supple  Lungs- Clear to  ausculation bilaterally, normal work of breathing.  No wheezes, rales, rhonchi Heart- Regular rate and rhythm, no murmurs, rubs or gallops  GI- soft, non-tender, non-distended, bowel sounds present  Extremities- no clubbing, cyanosis, or edema; DP/PT/radial pulses 2+ bilaterally MS- no significant deformity or atrophy Skin- warm and dry, no rash or lesion Psych- euthymic mood, full affect Neuro- strength and sensation are intact  Labs:   Lab Results  Component Value Date   WBC 8.0 07/02/2016   HGB 11.6 (L) 07/02/2016   HCT 36.0 07/02/2016   MCV 92.8 07/02/2016   PLT 195 07/02/2016    Recent Labs Lab 06/30/16 0154  NA 141  K 3.5  CL 103  CO2 30  BUN 36*  CREATININE 1.60*  CALCIUM 9.4  GLUCOSE 164*      Radiology/Studies: Dg Chest 2 View Result Date: 06/29/2016 CLINICAL DATA:  Seizures today with loss of consciousness. Undergoing radiation for breast cancer. EXAM: CHEST  2 VIEW COMPARISON:  06/22/2016 and 04/25/2016. FINDINGS: The left subclavian Port-A-Cath tip appears unchanged near the cavoatrial junction. The heart size and mediastinal contours are stable post CABG and aortic valve replacement. The lungs are clear. There is no pleural effusion or pneumothorax. Old fracture of the proximal right humerus noted. No worrisome osseous findings. IMPRESSION: Stable postoperative chest. No acute findings or explanation for seizures. Electronically Signed   By: Richardean Sale M.D.   On: 06/29/2016 16:00   Mr Jeri Cos And Wo Contrast Result Date: 06/29/2016 CLINICAL DATA:  Seizure at Radiation today, history of breast cancer, on chemoradiation. Seizure 3 years ago. History of diabetes, atrial fibrillation, hypertension. EXAM: MRI HEAD WITHOUT AND WITH CONTRAST TECHNIQUE: Multiplanar, multiecho pulse sequences of the brain and surrounding structures were obtained without and with intravenous contrast. CONTRAST:  13m MULTIHANCE GADOBENATE DIMEGLUMINE 529 MG/ML IV SOLN COMPARISON:  CT HEAD  June 29, 2016 at 1509 hours FINDINGS: Moderately motion degraded noncontrast examination. Post gadolinium sequences are moderate to severely motion degraded. INTRACRANIAL CONTENTS: No reduced diffusion to suggest acute ischemia or hypercellular tumor. No susceptibility artifact to suggest hemorrhage. The ventricles and sulci are normal for patient's age. No suspicious parenchymal signal, masses, mass effect. No abnormal intraparenchymal or extra-axial enhancement. No abnormal extra-axial fluid collections. No extra-axial masses. VASCULAR: Normal major intracranial vascular flow voids present at skull base. SKULL AND UPPER CERVICAL SPINE: No abnormal sellar expansion. No suspicious calvarial bone marrow signal. Craniocervical junction maintained. SINUSES/ORBITS: Mild paranasal sinus mucosal thickening. Mastoid air cells are well aerated. Status post bilateral ocular lens implants. The included ocular globes and orbital contents are non-suspicious. OTHER: None. IMPRESSION: Negative motion degraded MRI head with and without contrast, for age (no intracranial metastasis given moderate to severely motion degraded post gadolinium sequences). Electronically Signed   By: CElon AlasM.D.   On: 06/29/2016 21:14   Nm Pulmonary Perf And Vent Result Date: 06/22/2016 CLINICAL DATA:  Chest tightness with the breast. Cough and fever. History of breast carcinoma. EXAM: NUCLEAR MEDICINE VENTILATION - PERFUSION LUNG SCAN TECHNIQUE: Ventilation images were obtained in multiple projections using inhaled aerosol Tc-910mTPA. Perfusion images were obtained in multiple projections after intravenous injection of Tc-9960mA. RADIOPHARMACEUTICALS:  31.0 mCi Technetium-47m43mA aerosol inhalation and 4.1 mCi Technetium-47m 57mIV COMPARISON:  Current chest radiograph. Previous ventilation-perfusion study dated 04/09/2016. FINDINGS: Ventilation:  There is heterogeneous radiotracer accumulation in the lungs without a discrete  segmental appearing defect. Perfusion: There is also heterogeneous radiotracer accumulation to the lungs on profusion imaging similar to that seen with ventilation, but less prominent, without a segmental type defect. IMPRESSION: 1. No segmental type profusion defect is seen to suggest a pulmonary thromboembolism. 2. Patchy bilateral areas of nonsegmental relative decreased ventilation and perfusion. This suggests small airways disease. Electronically Signed   By: Lajean Manes M.D.   On: 06/22/2016 18:12    EKG:nsr and atrial tachycardia  TELEMETRY: NSR with atrial tachycardia  Assessment/Plan: 1.  Atrial tachycardia Asymptomatic for the most part. I discussed the treatment options. My biggest concern involves prlonged uncontrolled atrial tachycardia leading to a tachy induced CM. For this reason and because of her underlying AVR/CABG, we will undergo a trial of sotalol. She will need to be followed another day or two in the hospital.  2.  Syncope/seizures? No arrhythmias on telemetry during episodes Orthostatics markedly positive - agree with Midodrine Increase salt/water intake Consider abdominal binder  Thigh high compression hose  Her altered levels of consciousness are clearly not due to brady or tachy arrhythmias. I have recommended she be started on Sotalol. Midodrine is ok. Continue this.   3.  CAD s/p CABG No recent ischemic symptoms Continue medical therapy  Mikle Bosworth.D.

## 2016-07-02 NOTE — Progress Notes (Signed)
[facility] Progress Note  Patient: Hannah [name] WNI:[id number]   PCP: Doug Sou B, DO DOB: 10/26/1945   DOA: 06/29/2016   DOS: 07/02/2016   Date of Service: the patient was seen and examined on 07/02/2016  Brief hospital course: Pt. with PMH of CAD CABG, type II DM on insulin pump, A. fib not on anticoagulation, S/P bioprosthetic aortic valve, asthma, CKD 3, chronic diastolic CHF, HTN, right breast cancer stage IA S/P chemotherapy undergoing radiation; admitted on 06/29/2016, with complaint of passing out episodes, was found to have syncope with orthostatic hypotension and A. fib with RVR. Also was suspected to have focal seizures and started on antiepileptic medications. Currently further plan is close monitoring..  Assessment and Plan: 1. Seizure [facility]) Patient presented with seizure-like activity. Episode described as patient started to slump over followed by an unresponsive episode followed by generalized shaking off her extremities. One episode of while in the wheelchair and all other episodes while standing and walking. MRI brain with and without contrast was unremarkable EEG did not saw any epileptogenic foci. Patient did have another episode and was started on Vimpat by neurology. We will continue close monitoring at present. I suspect this episode correlate more with possibility of syncope rather than an active seizure.  2. Syncope. Orthostatic hypotension. A. fib with RVR followed by bradycardia. The patient has episode described as classic syncopal episodes. Orthostatic early positive today with blood pressure dropping from 381 to 82X systolic standing. Also RVR overnight with bradycardia during the day. Patient was on high-dose of Lasix recently for volume overload that she received after chemotherapy. Patient mentions she has lost 20 pounds since initiation of the Lasix. At present I would discontinue Lasix as well as Lopressor due to hypotension. Cardiology  consulted due to presence of RVR to assist in management for rate control in this situation. Appreciate input. EP consulted by cardiology.  3. Right breast cancer stage IA. Completed chemotherapy. Currently undergoing radiation therapy total 33 cycle on 15 cycle at present. Unable to complete her cycle since Friday. Will monitor.  4. CKD 3 Monitor renal function while patient is hypotensive.  5. Chronic diastolic dysfunction.  Currently euvolemic. We will monitor.  6. Type 2 diabetes mellitus. On insulin pump. A1c November 2017 6.4.  7. Chronic anemia. Currently monitor.  8. Neck pain.  patient complains of pain in her occipital region. Likely tension headache. No radiation, no neuropathy Will use when necessary Robaxin and Tylenol. Cervical spine x-ray unremarkable for any acute fracture. Shows osteopenia. MRI brain also looked at upper cervical area which did not have any acute abnormality.  9. Coronary artery disease. Continue aspirin and Plavix. We'll monitor cartilage recommendation. Currently holding beta blocker in the setting of hypotension.  Bowel regimen: last BM 06/29/2016 Diet: cardiac and diabetic diet DVT Prophylaxis: subcutaneous Heparin  Advance goals of care discussion: full code  Family Communication: family was present at bedside, at the time of interview. The pt provided permission to discuss medical plan with the family. Opportunity was given to ask question and all questions were answered satisfactorily.   Disposition:  Discharge to home. Expected discharge date: 07/05/2016, pending resolution of orthostasis and further workup of syncope per cardiology  Consultants: cardiology, neurology Procedures: EEG  Antibiotics: Anti-infectives    None        Subjective: Feeling better, neck pain has resolved,  Objective: Physical Exam: Vitals:   07/01/16 2045 07/01/16 2125 07/02/16 0439 07/02/16 1207  BP: (!) 106/59  (!) 98/50  Pulse: 73   74 74  Resp: 18  16 18   Temp: 98.1 F (36.7 C)  97.5 F (36.4 C) 98.2 F (36.8 C)  TempSrc: Oral  Oral Oral  SpO2: 100% 99% 99% 99%  Weight:   87.4 kg (192 lb 9.6 oz)   Height:        Intake/Output Summary (Last 24 hours) at 07/02/16 1543 Last data filed at 07/02/16 1354  Gross per 24 hour  Intake              840 ml  Output             1100 ml  Net             -260 ml   Filed Weights   06/30/16 0059 07/01/16 0547 07/02/16 0439  Weight: 87.6 kg (193 lb 3.2 oz) 87.5 kg (193 lb) 87.4 kg (192 lb 9.6 oz)    General: Alert, Awake and Oriented to Time, Place and Person. Appear in mild distress, affect appropriate Eyes: PERRL, Conjunctiva normal ENT: Oral Mucosa clear moist. Neck: no JVD, no Abnormal Mass Or lumps Cardiovascular: S1 and S2 Present, aortic systolic Murmur, Respiratory: Bilateral Air entry equal and Decreased, no use of accessory muscle, Clear to Auscultation, no Crackles, no wheezes Abdomen: Bowel Sound present, Soft and no tenderness Skin: no redness, no Rash, no induration Extremities: no Pedal edema, no calf tenderness Neurologic: Grossly no focal neuro deficit. Bilaterally Equal motor strength  Data Reviewed: CBC:  Recent Labs Lab 06/29/16 1430 06/30/16 0154 07/02/16 1258  WBC 9.6 8.5 8.0  HGB 12.6 11.3* 11.6*  HCT 36.7 34.9* 36.0  MCV 89.5 92.6 92.8  PLT 199 179 448   Basic Metabolic Panel:  Recent Labs Lab 06/29/16 1430 06/30/16 0154 07/02/16 1258  NA 139 141 137  K 3.8 3.5 4.3  CL 102 103 99*  CO2 30 30 29   GLUCOSE 188* 164* 157*  BUN 38* 36* 46*  CREATININE 1.53* 1.60* 1.94*  CALCIUM 9.3 9.4 9.2    Liver Function Tests: No results for input(s): AST, ALT, ALKPHOS, BILITOT, PROT, ALBUMIN in the last 168 hours. No results for input(s): LIPASE, AMYLASE in the last 168 hours. No results for input(s): AMMONIA in the last 168 hours. Coagulation Profile: No results for input(s): INR, PROTIME in the last 168 hours. Cardiac Enzymes: No  results for input(s): CKTOTAL, CKMB, CKMBINDEX, TROPONINI in the last 168 hours. BNP (last 3 results) No results for input(s): PROBNP in the last 8760 hours.  CBG:  Recent Labs Lab 07/01/16 1649 07/01/16 2126 07/02/16 0127 07/02/16 0628 07/02/16 1128  GLUCAP 84 188* 112* 109* 141*    Studies: Dg Cervical Spine 2 Or 3 Views  Result Date: 07/02/2016 CLINICAL DATA:  Headache.  Pain.  No injury. EXAM: CERVICAL SPINE - 2-3 VIEW COMPARISON:  MRI 06/29/2016.  CT 06/29/2016. FINDINGS: Diffuse osteopenia. No acute soft tissue bony abnormality. Ligamentous ossification noted. Lower cervical spine poorly visualized. Left subclavian line noted. Prior median sternotomy and CABG. Bilateral carotid vascular calcification IMPRESSION: 1. Diffuse osteopenia and degenerative change. Lower cervical spine difficult visualized. No acute bony abnormality. 2.  Bilateral carotid vascular disease . Electronically Signed   By: Marcello Moores  Register   On: 07/02/2016 06:58     Scheduled Meds: . aspirin EC  81 mg Oral Daily  . clopidogrel  75 mg Oral Daily  . ezetimibe  10 mg Oral Daily  . gabapentin  300 mg Oral TID  . heparin  5,000  Units Subcutaneous Q8H  . insulin pump   Subcutaneous TID AC, HS, 0200  . lacosamide  50 mg Oral BID  . loratadine  10 mg Oral Daily  . midodrine  5 mg Oral TID WC  . mometasone-formoterol  2 puff Inhalation BID  . rosuvastatin  40 mg Oral Daily  . sodium chloride flush  3 mL Intravenous Q12H   Continuous Infusions: PRN Meds: albuterol, LORazepam, methocarbamol, sodium chloride flush  Time spent: 30 minutes  Author: Berle Mull, MD [facility] Pager: [phone] 07/02/2016 3:43 PM  If 7PM-7AM, please contact night-coverage at [email or web address], password [facility]

## 2016-07-02 NOTE — Progress Notes (Signed)
Inpatient Diabetes Program Recommendations  AACE/ADA: New Consensus Statement on Inpatient Glycemic Control (2015)  Target Ranges:  Prepandial:   less than 140 mg/dL      Peak postprandial:   less than 180 mg/dL (1-2 hours)      Critically ill patients:  140 - 180 mg/dL   Lab Results  Component Value Date   GLUCAP 141 (H) 07/02/2016   HGBA1C 6.4 05/08/2016    Review of Glycemic Control Results for Hannah [name], Hannah [name] (MRN [id number]) as of 07/02/2016 15:55  Ref. Range 07/01/2016 16:49 07/01/2016 21:26 07/02/2016 01:27 07/02/2016 06:28 07/02/2016 11:28  Glucose-Capillary Latest Ref Range: 65 - 99 mg/dL 84 188 (H) 112 (H) 109 (H) 141 (H)   Diabetes history: DM2 Outpatient Diabetes medications: V-Go 76 units total q 24 hrs ( 40 units basal + meal coverage 8 units ac breakfast + 10 units lunch+ 10 units dinner) Current orders for Inpatient glycemic control: V-Go insulin pump   Inpatient Diabetes Program Recommendations:  Spoke with patient @ bedside and patient feels comfortable using insulin pump while in the hospital. Will follow.  Thank you, Nani Gasser. Niccolas Loeper, RN, MSN, CDE Inpatient Glycemic Control Team Team Pager [phone] (8am-5pm) 07/02/2016 4:00 PM

## 2016-07-02 NOTE — Consult Note (Signed)
[place] Nurse wound consult note Reason for Consult: radiation burns Wound type: dry desquamation under the right breast and right axilla Patient has been using Radiplex gel per her oncologist recommendations prior to noticing the skin irritation.  Wound bed: partial thickness areas, under the right breast. And the right breast is red.  Right axilla is not as irritated and not open. Drainage (amount, consistency, odor) none Periwound: intact  Dressing procedure/placement/frequency: Will add antimicrobial wicking textile and treat more like intertriginous dermatitis maybe fungal in nature with constant use of gel and the radiation.   Discussed POC with patient and bedside nurse.  Re consult if needed, will not follow at this time. Thanks  Marcelles Clinard [employer], RN,CWOCN, CNS [phone])

## 2016-07-02 NOTE — Assessment & Plan Note (Signed)
Patient was being wheeled down to radiation oncology for her daily radiation treatments: When she experienced a witnessed possible seizure.  I reserved state that patient was noted to have shaking of her hands and was unable to speak.  Patient also vomited during this episode as well.  Patient recovered from the seizure-like activity; but then had no memory of the event.  The nurse reviewed all with Dr. Isidore Moos radiation oncologist; who advised that this provider then be called for further assessment.  Apparently the patient had another episode of nonresponsiveness with her head slumped while she was in the wheelchair.  This provider arrived on the scene; 2.  The patient essentially alert and oriented.  She was able to follow all commands and only complaining of a mild headache.  She does not remember either of these episodes that it just happened.  Vital signs were essentially stable and patient was afebrile when checked.  Rapid response team was called due to possible new onset seizure activity. Patient was transported then to the emergency department.  CODE STATUS: Patient should be considered a full code; since there are no advanced directives in patient's chart.

## 2016-07-03 ENCOUNTER — Ambulatory Visit: Payer: Medicare Other | Admitting: Radiation Oncology

## 2016-07-03 ENCOUNTER — Ambulatory Visit: Payer: Medicare Other

## 2016-07-03 DIAGNOSIS — E2740 Unspecified adrenocortical insufficiency: Secondary | ICD-10-CM | POA: Diagnosis present

## 2016-07-03 LAB — CBC
HCT: 34.5 % — ABNORMAL LOW (ref 36.0–46.0)
Hemoglobin: 11.1 g/dL — ABNORMAL LOW (ref 12.0–15.0)
MCH: 29.9 pg (ref 26.0–34.0)
MCHC: 32.2 g/dL (ref 30.0–36.0)
MCV: 93 fL (ref 78.0–100.0)
Platelets: 166 10*3/uL (ref 150–400)
RBC: 3.71 MIL/uL — ABNORMAL LOW (ref 3.87–5.11)
RDW: 13.4 % (ref 11.5–15.5)
WBC: 7.4 10*3/uL (ref 4.0–10.5)

## 2016-07-03 LAB — BASIC METABOLIC PANEL
Anion gap: 10 (ref 5–15)
BUN: 46 mg/dL — ABNORMAL HIGH (ref 6–20)
CO2: 29 mmol/L (ref 22–32)
Calcium: 9.3 mg/dL (ref 8.9–10.3)
Chloride: 101 mmol/L (ref 101–111)
Creatinine, Ser: 1.93 mg/dL — ABNORMAL HIGH (ref 0.44–1.00)
GFR calc Af Amer: 29 mL/min — ABNORMAL LOW (ref >60)
GFR calc non Af Amer: 25 mL/min — ABNORMAL LOW (ref >60)
Glucose, Bld: 134 mg/dL — ABNORMAL HIGH (ref 65–99)
Potassium: 4.2 mmol/L (ref 3.5–5.1)
Sodium: 140 mmol/L (ref 135–145)

## 2016-07-03 LAB — GLUCOSE, CAPILLARY
Glucose-Capillary: 148 mg/dL — ABNORMAL HIGH (ref 65–99)
Glucose-Capillary: 132 mg/dL — ABNORMAL HIGH (ref 65–99)
Glucose-Capillary: 159 mg/dL — ABNORMAL HIGH (ref 65–99)
Glucose-Capillary: 46 mg/dL — ABNORMAL LOW (ref 65–99)
Glucose-Capillary: 85 mg/dL (ref 65–99)
Glucose-Capillary: 113 mg/dL — ABNORMAL HIGH (ref 65–99)
Glucose-Capillary: 224 mg/dL — ABNORMAL HIGH (ref 65–99)

## 2016-07-03 LAB — CORTISOL-AM, BLOOD: Cortisol - AM: 3.3 ug/dL — ABNORMAL LOW (ref 6.7–22.6)

## 2016-07-03 MED ORDER — INSULIN PUMP
Freq: Three times a day (TID) | SUBCUTANEOUS | Status: DC
  Administered 2016-07-03: 22:00:00 via SUBCUTANEOUS
  Administered 2016-07-03 (×2): 1 via SUBCUTANEOUS
  Administered 2016-07-04: 14 via SUBCUTANEOUS
  Filled 2016-07-03: qty 1

## 2016-07-03 MED ORDER — MIDODRINE HCL 5 MG PO TABS
5.0000 mg | ORAL_TABLET | Freq: Three times a day (TID) | ORAL | Status: DC
  Administered 2016-07-03 – 2016-07-04 (×5): 5 mg via ORAL
  Filled 2016-07-03 (×5): qty 1

## 2016-07-03 MED ORDER — DEXTROSE 50 % IV SOLN
INTRAVENOUS | Status: AC
  Filled 2016-07-03: qty 50

## 2016-07-03 MED ORDER — GLUCOSE 40 % PO GEL
ORAL | Status: AC
  Filled 2016-07-03: qty 1

## 2016-07-03 MED ORDER — COSYNTROPIN 0.25 MG IJ SOLR
0.2500 mg | Freq: Once | INTRAMUSCULAR | Status: AC
  Administered 2016-07-04: 0.25 mg via INTRAVENOUS
  Filled 2016-07-03: qty 0.25

## 2016-07-03 NOTE — Progress Notes (Signed)
Pt self administered insulin via insulin pump after breakfast and lunch, pt states she ate 100% of meals  0830 -10 units of insulin  1230 - 10 units insulin

## 2016-07-03 NOTE — Progress Notes (Signed)
[facility] Progress Note  Patient: Hannah [name] HAL:[id number]   PCP: Doug Sou B, DO DOB: [birth year]   DOA: 06/29/2016   DOS: 07/03/2016   Date of Service: the patient was seen and examined on 07/03/2016  Brief hospital course: Pt. with PMH of CAD CABG, type II DM on insulin pump, A. fib not on anticoagulation, S/P bioprosthetic aortic valve, asthma, CKD 3, chronic diastolic CHF, HTN, right breast cancer stage IA S/P chemotherapy undergoing radiation; admitted on 06/29/2016, with complaint of passing out episodes, was found to have syncope with orthostatic hypotension and A. fib with RVR. Also was suspected to have focal seizures and started on antiepileptic medications. Currently further plan is close monitoring..  Assessment and Plan: 1. Seizure like episodes Patient presented with seizure-like activity. Episode described as patient started to slump over followed by an unresponsive episode followed by generalized shaking off her extremities. One episode of while in the wheelchair and all other episodes while standing and walking. MRI brain with and without contrast was unremarkable EEG did not saw any epileptogenic foci. Patient did have another episode and was started on Vimpat by neurology. We will continue close monitoring at present. I suspect this episode correlate more with possibility of syncope rather than an active seizure.  2. Syncope. Orthostatic hypotension. Atrial tachycardia with RVR followed by bradycardia. On Betapace, new initiation in the hospital. The patient has episode described as classic syncopal episodes. Orthostatic early positive today with blood pressure dropping from 735 to 32D systolic standing. Also RVR overnight with bradycardia during the day. Patient was on high-dose of Lasix recently for volume overload that she got after chemotherapy. Patient mentions she has lost 20 pounds since initiation of the Lasix. At present I would discontinue  Lasix as well as Lopressor due to hypotension. Cardiology consulted due to presence of RVR to assist in management for rate control in this situation. Appreciate input. EP consulted by cardiology. Started on Betapace at present, monitor for next 72 hours.   3. Adrenal insufficiency. A.m. cortisol level is 3.3. On its own it is diagnostic. I would check cosyntropin stim admission test prior to initiation of therapy.  4. Right breast cancer stage IA. Completed chemotherapy. Currently undergoing radiation therapy total 33 cycle on 15 cycle at present. Unable to complete her cycle since Friday. Will monitor.  5. CKD 3 Monitor renal function while patient is hypotensive.  6. Chronic diastolic dysfunction.  Currently euvolemic. We will monitor.  7. Type 2 diabetes mellitus. On insulin pump. A1c November 2017 6.4.  8. Chronic anemia. Currently monitor.  9. Neck pain.  patient complains of pain in her occipital region. Likely tension headache. No radiation, no neuropathy Will use when necessary Robaxin and Tylenol. Cervical spine x-ray unremarkable for any acute fracture. Shows osteopenia. MRI brain also looked at upper cervical area which did not have any acute abnormality.  10. Coronary artery disease. Continue aspirin and Plavix. We'll monitor cartilage recommendation. Currently holding beta blocker in the setting of hypotension.  Bowel regimen: last BM 07/01/2016 Diet: cardiac and diabetic diet DVT Prophylaxis: subcutaneous Heparin  Advance goals of care discussion: full code  Family Communication: family was present at bedside, at the time of interview. The pt provided permission to discuss medical plan with the family. Opportunity was given to ask question and all questions were answered satisfactorily.   Disposition:  Discharge to home. Expected discharge date: 07/05/2016, Betapace therapy initiation  Consultants: cardiology, neurology Procedures:  EEG  Antibiotics: Anti-infectives  None      Subjective: no Dizziness, lightheadedness, nausea or vomiting. No further episodes.  Objective: Physical Exam: Vitals:   07/02/16 2134 07/02/16 2158 07/03/16 0522 07/03/16 0812  BP:  (!) 114/50 (!) 116/48   Pulse:  74 68   Resp:  18 18   Temp:  97.9 F (36.6 C) 97.6 F (36.4 C)   TempSrc:  Oral Oral   SpO2: 99% 96% 98% 99%  Weight:   87.6 kg (193 lb 3.2 oz)   Height:        Intake/Output Summary (Last 24 hours) at 07/03/16 1107 Last data filed at 07/03/16 0200  Gross per 24 hour  Intake              610 ml  Output              200 ml  Net              410 ml   Filed Weights   07/01/16 0547 07/02/16 0439 07/03/16 0522  Weight: 87.5 kg (193 lb) 87.4 kg (192 lb 9.6 oz) 87.6 kg (193 lb 3.2 oz)    General: Alert, Awake and Oriented to Time, Place and Person. Appear in mild distress, affect appropriate Eyes: PERRL, Conjunctiva normal ENT: Oral Mucosa clear moist. Neck: no JVD, no Abnormal Mass Or lumps Cardiovascular: S1 and S2 Present, aortic systolic Murmur, Respiratory: Bilateral Air entry equal and Decreased, no use of accessory muscle, Clear to Auscultation, no Crackles, no wheezes Abdomen: Bowel Sound present, Soft and no tenderness Skin: no redness, no Rash, no induration Extremities: no Pedal edema, no calf tenderness Neurologic: Grossly no focal neuro deficit. Bilaterally Equal motor strength  Data Reviewed: CBC:  Recent Labs Lab 06/29/16 1430 06/30/16 0154 07/02/16 1258 07/03/16 0335  WBC 9.6 8.5 8.0 7.4  HGB 12.6 11.3* 11.6* 11.1*  HCT 36.7 34.9* 36.0 34.5*  MCV 89.5 92.6 92.8 93.0  PLT 199 179 195 923   Basic Metabolic Panel:  Recent Labs Lab 06/29/16 1430 06/30/16 0154 07/02/16 1258 07/03/16 0335  NA 139 141 137 140  K 3.8 3.5 4.3 4.2  CL 102 103 99* 101  CO2 30 30 29 29   GLUCOSE 188* 164* 157* 134*  BUN 38* 36* 46* 46*  CREATININE 1.53* 1.60* 1.94* 1.93*  CALCIUM 9.3 9.4 9.2 9.3     Liver Function Tests: No results for input(s): AST, ALT, ALKPHOS, BILITOT, PROT, ALBUMIN in the last 168 hours. No results for input(s): LIPASE, AMYLASE in the last 168 hours. No results for input(s): AMMONIA in the last 168 hours. Coagulation Profile: No results for input(s): INR, PROTIME in the last 168 hours. Cardiac Enzymes: No results for input(s): CKTOTAL, CKMB, CKMBINDEX, TROPONINI in the last 168 hours. BNP (last 3 results) No results for input(s): PROBNP in the last 8760 hours.  CBG:  Recent Labs Lab 07/02/16 1128 07/02/16 1625 07/02/16 2157 07/03/16 0203 07/03/16 0630  GLUCAP 141* 102* 82 148* 132*    Studies: No results found.   Scheduled Meds: . aspirin EC  81 mg Oral Daily  . clopidogrel  75 mg Oral Daily  . [START ON 07/04/2016] cosyntropin  0.25 mg Intravenous Once  . ezetimibe  10 mg Oral Daily  . gabapentin  300 mg Oral TID  . heparin  5,000 Units Subcutaneous Q8H  . insulin pump   Subcutaneous TID AC, HS, 0200  . loratadine  10 mg Oral Daily  . midodrine  5 mg Oral TID WC  .  mometasone-formoterol  2 puff Inhalation BID  . rosuvastatin  40 mg Oral Daily  . sodium chloride flush  3 mL Intravenous Q12H  . sotalol  120 mg Oral Q12H   Continuous Infusions: PRN Meds: albuterol, LORazepam, methocarbamol, sodium chloride flush  Time spent: 30 minutes  Author: Berle Mull, MD [facility] Pager: [phone] 07/03/2016 11:07 AM  If 7PM-7AM, please contact night-coverage at [email or web address], password [facility]

## 2016-07-03 NOTE — Progress Notes (Signed)
Pt CBG 46, pt sitting on edge of bed talking, alert and oriented x4. Pt refused glutose gel. Paged Md, awaiting call back. Pt eating crackers and peanut butter and drinking grape juice. Pt states this has happened before  and she always has a snack and juice to correct CBG. Will continue to monitor and recheck in 30 minutes.  Hannah [name]

## 2016-07-03 NOTE — Progress Notes (Signed)
Subjective:  Denies SSCP, palpitations or Dyspnea Sitting up this am no issues   Objective:  Vitals:   07/02/16 2134 07/02/16 2158 07/03/16 0522 07/03/16 0812  BP:  (!) 114/50 (!) 116/48   Pulse:  74 68   Resp:  18 18   Temp:  97.9 F (36.6 C) 97.6 F (36.4 C)   TempSrc:  Oral Oral   SpO2: 99% 96% 98% 99%  Weight:   193 lb 3.2 oz (87.6 kg)   Height:        Intake/Output from previous day:  Intake/Output Summary (Last 24 hours) at 07/03/16 0929 Last data filed at 07/03/16 0200  Gross per 24 hour  Intake              610 ml  Output              400 ml  Net              210 ml    Physical Exam: . 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  No HSM or HJR Distal pulses intact with no bruits No edema Neuro non-focal Skin warm and dry No muscular weakness   Lab Results: Basic Metabolic Panel:  Recent Labs  07/02/16 1258 07/03/16 0335  NA 137 140  K 4.3 4.2  CL 99* 101  CO2 29 29  GLUCOSE 157* 134*  BUN 46* 46*  CREATININE 1.94* 1.93*  CALCIUM 9.2 9.3   CBC:  Recent Labs  07/02/16 1258 07/03/16 0335  WBC 8.0 7.4  HGB 11.6* 11.1*  HCT 36.0 34.5*  MCV 92.8 93.0  PLT 195 166   Thyroid Function Tests:  Recent Labs  07/02/16 1626  TSH 1.431   Anemia Panel: No results for input(s): VITAMINB12, FOLATE, FERRITIN, TIBC, IRON, RETICCTPCT in the last 72 hours.  Imaging: Dg Cervical Spine 2 Or 3 Views  Result Date: 07/02/2016 CLINICAL DATA:  Headache.  Pain.  No injury. EXAM: CERVICAL SPINE - 2-3 VIEW COMPARISON:  MRI 06/29/2016.  CT 06/29/2016. FINDINGS: Diffuse osteopenia. No acute  soft tissue bony abnormality. Ligamentous ossification noted. Lower cervical spine poorly visualized. Left subclavian line noted. Prior median sternotomy and CABG. Bilateral carotid vascular calcification IMPRESSION: 1. Diffuse osteopenia and degenerative change. Lower cervical spine difficult visualized. No acute bony abnormality. 2.  Bilateral carotid vascular disease . Electronically Signed   By: Marcello Moores  Register   On: 07/02/2016 06:58    Cardiac Studies:  ECG:  NSR no acute changes    Telemetry:  NSR this am rates 70's no atrial tachycardia   Echo: EF 60-65% 04/08/16   Medications:   . aspirin EC  81 mg Oral Daily  . clopidogrel  75 mg Oral Daily  . [START ON 07/04/2016] cosyntropin  0.25 mg Intravenous Once  . ezetimibe  10 mg Oral Daily  . gabapentin  300 mg Oral TID  . heparin  5,000 Units Subcutaneous Q8H  . insulin pump   Subcutaneous TID AC, HS, 0200  . loratadine  10 mg Oral Daily  . midodrine  5 mg Oral TID WC  . mometasone-formoterol  2 puff Inhalation BID  .  rosuvastatin  40 mg Oral Daily  . sodium chloride flush  3 mL Intravenous Q12H  . sotalol  120 mg Oral Q12H      Assessment/Plan:  Syncope:  Postural improved started on midodrine Cortisol level low Stim test ordered  Atrial tachycardia: started on sotolol see consult note Dr Lovena Le Telemetry ok this am Cancer: new mesh on radiation burns may need imaging of adrenals if stim test positive Has port a cath for access ? Need for LP CT/MRI ok   Jenkins Rouge 07/03/2016, 9:29 AM

## 2016-07-04 ENCOUNTER — Ambulatory Visit: Payer: Medicare Other | Admitting: Medical

## 2016-07-04 ENCOUNTER — Ambulatory Visit: Payer: Medicare Other

## 2016-07-04 LAB — CBC
HCT: 33.2 % — ABNORMAL LOW (ref 36.0–46.0)
Hemoglobin: 10.7 g/dL — ABNORMAL LOW (ref 12.0–15.0)
MCH: 29.6 pg (ref 26.0–34.0)
MCHC: 32.2 g/dL (ref 30.0–36.0)
MCV: 91.7 fL (ref 78.0–100.0)
Platelets: 160 10*3/uL (ref 150–400)
RBC: 3.62 MIL/uL — ABNORMAL LOW (ref 3.87–5.11)
RDW: 13.1 % (ref 11.5–15.5)
WBC: 6.5 10*3/uL (ref 4.0–10.5)

## 2016-07-04 LAB — GLUCOSE, CAPILLARY
Glucose-Capillary: 93 mg/dL (ref 65–99)
Glucose-Capillary: 100 mg/dL — ABNORMAL HIGH (ref 65–99)
Glucose-Capillary: 185 mg/dL — ABNORMAL HIGH (ref 65–99)

## 2016-07-04 LAB — BASIC METABOLIC PANEL
Anion gap: 10 (ref 5–15)
BUN: 47 mg/dL — ABNORMAL HIGH (ref 6–20)
CO2: 28 mmol/L (ref 22–32)
Calcium: 9.3 mg/dL (ref 8.9–10.3)
Chloride: 101 mmol/L (ref 101–111)
Creatinine, Ser: 1.66 mg/dL — ABNORMAL HIGH (ref 0.44–1.00)
GFR calc Af Amer: 35 mL/min — ABNORMAL LOW (ref >60)
GFR calc non Af Amer: 30 mL/min — ABNORMAL LOW (ref >60)
Glucose, Bld: 116 mg/dL — ABNORMAL HIGH (ref 65–99)
Potassium: 4.1 mmol/L (ref 3.5–5.1)
Sodium: 139 mmol/L (ref 135–145)

## 2016-07-04 LAB — ACTH STIMULATION, 3 TIME POINTS
Cortisol, 30 Min: 22.3 ug/dL
Cortisol, 60 Min: 25.6 ug/dL
Cortisol, Base: 5.9 ug/dL

## 2016-07-04 MED ORDER — ACETAMINOPHEN 325 MG PO TABS
650.0000 mg | ORAL_TABLET | ORAL | Status: DC | PRN
  Administered 2016-07-04: 650 mg via ORAL
  Filled 2016-07-04: qty 2

## 2016-07-04 MED ORDER — FUROSEMIDE 80 MG PO TABS: 40.0000 mg | ORAL_TABLET | Freq: Every day | ORAL | 3 refills | Status: DC | PRN

## 2016-07-04 MED ORDER — HEPARIN SOD (PORK) LOCK FLUSH 100 UNIT/ML IV SOLN
500.0000 [IU] | INTRAVENOUS | Status: AC | PRN
  Administered 2016-07-04: 500 [IU]

## 2016-07-04 MED ORDER — POTASSIUM CHLORIDE CRYS ER 20 MEQ PO TBCR: 20.0000 meq | EXTENDED_RELEASE_TABLET | Freq: Every day | ORAL | 3 refills | Status: DC | PRN

## 2016-07-04 MED ORDER — SOTALOL HCL 120 MG PO TABS: 120.0000 mg | ORAL_TABLET | Freq: Two times a day (BID) | ORAL | 1 refills | Status: DC

## 2016-07-04 MED ORDER — MIDODRINE HCL 5 MG PO TABS: 5.0000 mg | ORAL_TABLET | Freq: Three times a day (TID) | ORAL | 1 refills | Status: DC

## 2016-07-04 NOTE — Care Management Note (Signed)
Case Management Note  Patient Details  Name: Hannah [name] MRN: [id number] Date of Birth: 14-Aug-1945                 Action/Plan: CM talked to patient with spouse at the bedside to offer [facility] choice.Patient stated " I am too active to have [place] and I am not going to stay at home.Marland KitchenMarland KitchenI don't want it."   Expected Discharge Date:  07/04/16               Expected Discharge Plan:  [place]    Discharge planning Services  CM Consult  Choice offered to:  Patient, Spouse  HH Arranged:  Patient Refused  Status of Service:  Completed, signed off  Sherrilyn Rist [phone] 07/04/2016, 1:45 PM

## 2016-07-04 NOTE — Progress Notes (Signed)
Ortho BP done patient bp drop to 83/41 while first standing, denies dizziness, no shortness of breath, no pain. See flowsheet for the rest of BP. Will continue to monitor the patient.

## 2016-07-04 NOTE — Progress Notes (Signed)
Progress Note  Patient Name: Hannah [name] Date of Encounter: 07/04/2016  Primary Cardiologist: Dr Johnsie Cancel EP: Dr. Lovena Le  Subjective   Patient is alert and talkative. SHe denies chest pain, palpitations, near syncope, orthopnea or PND. She had mild DOE with walking with staff yesterday and was told to take deeper breaths.   Inpatient Medications    Scheduled Meds: . aspirin EC  81 mg Oral Daily  . clopidogrel  75 mg Oral Daily  . ezetimibe  10 mg Oral Daily  . gabapentin  300 mg Oral TID  . heparin  5,000 Units Subcutaneous Q8H  . insulin pump   Subcutaneous TID AC, HS, 0200  . loratadine  10 mg Oral Daily  . midodrine  5 mg Oral TID WC  . mometasone-formoterol  2 puff Inhalation BID  . rosuvastatin  40 mg Oral Daily  . sodium chloride flush  3 mL Intravenous Q12H  . sotalol  120 mg Oral Q12H   Continuous Infusions:  PRN Meds: acetaminophen, albuterol, LORazepam, methocarbamol, sodium chloride flush   Vital Signs    Vitals:   07/03/16 2048 07/04/16 0445 07/04/16 0736 07/04/16 0743  BP: (!) 123/58 (!) 94/41 (!) 106/50   Pulse: 66 66 (!) 58   Resp:   16   Temp: 97.8 F (36.6 C) 97.5 F (36.4 C) 97.7 F (36.5 C)   TempSrc: Oral Oral Oral   SpO2: 95% 98% 97% 91%  Weight:      Height:        Intake/Output Summary (Last 24 hours) at 07/04/16 0835 Last data filed at 07/04/16 0445  Gross per 24 hour  Intake              240 ml  Output              650 ml  Net             -410 ml   Filed Weights   07/01/16 0547 07/02/16 0439 07/03/16 0522  Weight: 193 lb (87.5 kg) 192 lb 9.6 oz (87.4 kg) 193 lb 3.2 oz (87.6 kg)    Telemetry    NSR with rates in the 60's-70's, one short run of atrial tachycardia at 1848 - Personally Reviewed  ECG    No new tracings  Physical Exam   GEN: Overweigth, ill caucasian female with alopecia from chemo, No acute distress.  Neck: No JVD Cardiac: RRR, no murmurs, rubs, or gallops.  Respiratory: Clear to auscultation  bilaterally. GI: Soft, nontender, non-distended  MS: No edema; No deformity. Neuro:  AAOx3. Psych: Normal affect  Labs    Chemistry Recent Labs Lab 07/02/16 1258 07/03/16 0335 07/04/16 0431  NA 137 140 139  K 4.3 4.2 4.1  CL 99* 101 101  CO2 29 29 28   GLUCOSE 157* 134* 116*  BUN 46* 46* 47*  CREATININE 1.94* 1.93* 1.66*  CALCIUM 9.2 9.3 9.3  GFRNONAA 25* 25* 30*  GFRAA 29* 29* 35*  ANIONGAP 9 10 10      Hematology Recent Labs Lab 07/02/16 1258 07/03/16 0335 07/04/16 0431  WBC 8.0 7.4 6.5  RBC 3.88 3.71* 3.62*  HGB 11.6* 11.1* 10.7*  HCT 36.0 34.5* 33.2*  MCV 92.8 93.0 91.7  MCH 29.9 29.9 29.6  MCHC 32.2 32.2 32.2  RDW 13.2 13.4 13.1  PLT 195 166 160      Radiology    No results found.  Cardiac Studies   Echo from 04/08/2016 Study Conclusions - Left ventricle: The  cavity size was normal. There was moderate   focal basal and mild concentric hypertrophy. Systolic function   was normal. The estimated ejection fraction was in the range of   60% to 65%. Images were inadequate for LV wall motion assessment.   Features are consistent with a pseudonormal left ventricular   filling pattern, with concomitant abnormal relaxation and   increased filling pressure (grade 2 diastolic dysfunction).   Doppler parameters are consistent with high ventricular filling   pressure. - Aortic valve: A bioprosthesis was present and functioning   normally. Mean gradient (S): 17 mm Hg. - Mitral valve: Moderately calcified annulus. - Pulmonary arteries: PA peak pressure: 38 mm Hg (S).  Impressions: - The right ventricular systolic pressure was increased consistent   with mild pulmonary hypertensionStudy Conclusions   Patient Profile     71 y.o. female  with a past medical history significant for breast cancer (currently undergoing XRT), CAD s/p CABG, atrial tachycardia, diabetes, prior TIA.  She was admitted on 06/29/16 with episodes of unresponsiveness.   Assessment &  Plan    Syncope -EP, Dr. Lovena Le, consulted   -no arrhythmias on telemetry during episodes -orthostatics markedly positive, Midodrine started. BP currently stable. SBP 94-126. No dizziness or near syncope over the last day. -Increase salt/water intake -Consider abdominal binder, thigh high compression stockings -Her altered levels of consciousness are not due to brady or tachy arrhythmias per Dr. Lovena Le -Sotalol started for atrial tachycardia, had one brief episode last evening. Better control. -Cortisol level low, normal cortisol stim test  Atrial tachycardia -Concerned for prolonged uncontrolled atrial tachycardia leading to tachycardia induced CM.  -Trial of sotalol per EP -Plan to follow up outpatient with Dr. Lovena Le  CAD S/P CABG -no recent ischemic symptoms -Continue medical therapy   Signed, Daune Perch, NP  07/04/2016, 8:35 AM    BP seems to be better stim test ? Negative despite low cortisol Less atrial tachycardia with sotolol and no  proarrhythmia will arrange outpatient f/u with Dr Lovena Le. Radiation skin "burns" right breast improving  Jenkins Rouge

## 2016-07-04 NOTE — Progress Notes (Signed)
Patient requesting for pain medicine. MD notified.

## 2016-07-04 NOTE — Evaluation (Signed)
Physical Therapy Evaluation Patient Details Name: Hannah [name] MRN: [id number] DOB: September 04, 1945 Today's Date: 07/04/2016   History of Present Illness  Pt. with PMH of CAD CABG, type II DM on insulin pump, A. fib not on anticoagulation, S/P bioprosthetic aortic valve, asthma, CKD 3, chronic diastolic CHF, HTN, right breast cancer stage IA S/P chemotherapy undergoing radiation; admitted on 06/29/2016, with complaint of passing out episodes, was found to have syncope with orthostatic hypotension and A. fib with RVR. Possible seizure.  Clinical Impression  Patient seen for mobility assessment and education. Patient demonstrates deficits in functional mobility as indicated below. Will need continued skilled PT to address deficits and maximize function. Will see as indicated and progress as tolerated.  OF NOTE: ambulated on room air with saturations to 89%, improved with rest break and pursed lip breathing. Educated patient at length regarding energy conservation, safety with mobility and balance improvement strategies.     Follow Up Recommendations Home health PT;Supervision - Intermittent (HHPT if patient desires)    Equipment Recommendations  None recommended by PT    Recommendations for Other Services       Precautions / Restrictions Precautions Precautions: Fall Restrictions Weight Bearing Restrictions: No      Mobility  Bed Mobility               General bed mobility comments: received EOB  Transfers Overall transfer level: Needs assistance Equipment used: None Transfers: Sit to/from Stand Sit to Stand: Supervision         General transfer comment: Supervision for safety, modest instability no physical assist  Ambulation/Gait Ambulation/Gait assistance: Supervision Ambulation Distance (Feet): 310 Feet Assistive device: None Gait Pattern/deviations: Drifts right/left;Step-through pattern;Decreased stride length Gait velocity: decreased Gait velocity  interpretation: Below normal speed for age/gender General Gait Details: some instability noted with fatigue, decreased saturations to 89% on room air, rebounded quickly with standing rest break and pursed lip breathing.  Stairs            Wheelchair Mobility    Modified Rankin (Stroke Patients Only)       Balance Overall balance assessment: Needs assistance;History of Falls (syncopal episodes) Sitting-balance support: Feet supported Sitting balance-Leahy Scale: Good     Standing balance support: During functional activity Standing balance-Leahy Scale: Fair Standing balance comment: some instability noted upon fatigue, use of ocassional UE support                             Pertinent Vitals/Pain Pain Assessment: No/denies pain    Home Living Family/patient expects to be discharged to:: Private residence Living Arrangements: Spouse/significant other;Other relatives (husband and granddaughter) Available Help at Discharge: Family Type of Home: House Home Access: Ramped entrance     Home Layout: One level Home Equipment: Shower seat;Grab bars - tub/shower;Grab bars - toilet;Walker - 2 wheels      Prior Function Level of Independence: Independent               Hand Dominance   Dominant Hand: Right    Extremity/Trunk Assessment   Upper Extremity Assessment Upper Extremity Assessment: Overall WFL for tasks assessed    Lower Extremity Assessment Lower Extremity Assessment: RLE deficits/detail;LLE deficits/detail RLE Sensation: history of peripheral neuropathy LLE Sensation: history of peripheral neuropathy       Communication   Communication: No difficulties  Cognition Arousal/Alertness: Awake/alert Behavior During Therapy: WFL for tasks assessed/performed Overall Cognitive Status: Within Functional Limits for tasks assessed  General Comments General comments (skin integrity, edema, etc.): educated on energy  conservation techniques and mobility improvement strategieis    Exercises     Assessment/Plan    PT Assessment Patient needs continued PT services  PT Problem List Decreased activity tolerance;Decreased balance;Decreased mobility          PT Treatment Interventions DME instruction;Gait training;Functional mobility training;Therapeutic activities;Therapeutic exercise;Balance training;Patient/family education    PT Goals (Current goals can be found in the Care Plan section)  Acute Rehab PT Goals Patient Stated Goal: to go home PT Goal Formulation: With patient Time For Goal Achievement: 07/18/16 Potential to Achieve Goals: Good    Frequency Min 3X/week   Barriers to discharge        Co-evaluation               End of Session   Activity Tolerance: Patient tolerated treatment well Patient left: in bed;with call bell/phone within reach;with family/visitor present (sitting EOB) Nurse Communication: Mobility status         Time: 4196-2229 PT Time Calculation (min) (ACUTE ONLY): 28 min   Charges:   PT Evaluation $PT Eval Moderate Complexity: 1 Procedure PT Treatments $Gait Training: 8-22 mins   PT G Codes:        Duncan Dull 2016/07/06, 9:30 AM Alben Deeds, PT DPT  [phone]

## 2016-07-04 NOTE — Discharge Instructions (Signed)
Follow with Nicola Girt, DO in 1-2 weeks FOllow up with Radiation Oncology and Dr. Lindi Adie as scheduled   Please get a complete blood count and chemistry panel checked by your Primary MD at your next visit, and again as instructed by your Primary MD. Please get your medications reviewed and adjusted by your Primary MD.  Please request your Primary MD to go over all Hospital Tests and Procedure/Radiological results at the follow up, please get all Hospital records sent to your Prim MD by signing hospital release before you go home.  If you had Pneumonia of Lung problems at the Hospital: Please get a 2 view Chest X ray done in 6-8 weeks after hospital discharge or sooner if instructed by your Primary MD.  If you have Congestive Heart Failure: Please call your Cardiologist or Primary MD anytime you have any of the following symptoms:  1) 3 pound weight gain in 24 hours or 5 pounds in 1 week  2) shortness of breath, with or without a dry hacking cough  3) swelling in the hands, feet or stomach  4) if you have to sleep on extra pillows at night in order to breathe  Follow cardiac low salt diet and 1.5 lit/day fluid restriction.  If you have diabetes Accuchecks 4 times/day, Once in AM empty stomach and then before each meal. Log in all results and show them to your primary doctor at your next visit. If any glucose reading is under 80 or above 300 call your primary MD immediately.  If you have Seizure/Convulsions/Epilepsy: Please do not drive, operate heavy machinery, participate in activities at heights or participate in high speed sports until you have seen by Primary MD or a Neurologist and advised to do so again.  If you had Gastrointestinal Bleeding: Please ask your Primary MD to check a complete blood count within one week of discharge or at your next visit. Your endoscopic/colonoscopic biopsies that are pending at the time of discharge, will also need to followed by your Primary  MD.  Get Medicines reviewed and adjusted. Please take all your medications with you for your next visit with your Primary MD  Please request your Primary MD to go over all hospital tests and procedure/radiological results at the follow up, please ask your Primary MD to get all Hospital records sent to his/her office.  If you experience worsening of your admission symptoms, develop shortness of breath, life threatening emergency, suicidal or homicidal thoughts you must seek medical attention immediately by calling 911 or calling your MD immediately  if symptoms less severe.  You must read complete instructions/literature along with all the possible adverse reactions/side effects for all the Medicines you take and that have been prescribed to you. Take any new Medicines after you have completely understood and accpet all the possible adverse reactions/side effects.   Do not drive or operate heavy machinery when taking Pain medications.   Do not take more than prescribed Pain, Sleep and Anxiety Medications  Special Instructions: If you have smoked or chewed Tobacco  in the last 2 yrs please stop smoking, stop any regular Alcohol  and or any Recreational drug use.  Wear Seat belts while driving.  Please note You were cared for by a hospitalist during your hospital stay. If you have any questions about your discharge medications or the care you received while you were in the hospital after you are discharged, you can call the unit and asked to speak with the hospitalist on call  if the hospitalist that took care of you is not available. Once you are discharged, your primary care physician will handle any further medical issues. Please note that NO REFILLS for any discharge medications will be authorized once you are discharged, as it is imperative that you return to your primary care physician (or establish a relationship with a primary care physician if you do not have one) for your aftercare needs so  that they can reassess your need for medications and monitor your lab values.  You can reach the hospitalist office at phone [phone] or fax [phone]   If you do not have a primary care physician, you can call [phone] for a physician referral.  Activity: As tolerated with Full fall precautions use walker/cane & assistance as needed  Diet: regular  Disposition Home

## 2016-07-04 NOTE — Progress Notes (Signed)
Patient alert and oriented, denies pain. Tele d/c port cath daccess  By iv team. D/c instruction explain and given to the patient. Pt. Verbalized understanding. D/c pt. Home per order.

## 2016-07-04 NOTE — Progress Notes (Signed)
Patient refused to take Heparin SQ. MD notified.

## 2016-07-04 NOTE — Discharge Summary (Signed)
Physician Discharge Summary  Hannah [name] YQM:[id number] DOB: 1946/03/27 DOA: 06/29/2016  PCP: Nicola Girt, DO  Admit date: 06/29/2016 Discharge date: 07/04/2016  Admitted From: home Disposition:  home  Recommendations for Outpatient Follow-up:  1. Follow up with Radiation Oncology and Oncology as scheduled 2. Follow up with Dr. Lovena Le in 2-3 weeks 3. Sotalol started this admission, Metoprolol discontinued 4. Lasix changed to PRN 5. Patient started on Midodrine  Home Health: PT  Discharge Condition: stable CODE STATUS: Full code Diet recommendation: regular  HPI: Per Dr. Maylene Roes, Hannah [name] is a 71 y.o. female with medical history significant of multiple comorbidities including CAD S/P CABG x 2, Type 2 DM (uses a disposable insulin pump; changed qHS), paroxysmal atrial fibrillation (CHADS-Vasc score of 7, she is not anticoagulated), S/P aortic valve replacement, moderate persistent asthma, CKD 3, compensated diastolic CHF, HTN, and breast cancer (S/P chemotherapy, currently undergoing radiation therapy M-F, Dr. Sondra Come) who presented today after a seizure-like activity at her radiation appointment. She was recently discharged from the hospital after treatment of influenza. She and family state that today while sitting in a wheelchair at her radiation appointment, she became unresponsive. Her eyes were open, glassy, and she was unresponsive to verbal stimuli. She then started having bilateral upper extremity shaking and vomiting afterward. She did not have any tongue biting or urinary incontinence with this episode. She was confused for a few minutes afterwards and did not recall what had happened. Staff at the radiation center checked her blood sugar which was 206, vital signs were stable. She was sent to the ED for further evaluation. She has no other acute complaints. She has generalized weakness, which is slowly improving since hospitalization. She denies any headaches, dizziness,  lightheadedness, vision changes, chest pain or shortness of breath, nausea, abdominal pain, diarrhea. She states that she did have a seizure 3 years ago when she was admitted for stroke. She is currently not on any antiepileptics. Family also tell me that while she was admitted, she had a similar episode while on the toilet on 1/12. At the time, it was thought to be syncopal episode. Family states that while in the ED, patient had another episode of unresponsiveness. There was no shaking associated with this time. She was again confused for a few minutes afterwards.  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 Neurology was consulted, and while initially she was started on Vimpat by neurology, it was later determined that her seizure-like episodes are most likely due to syncope rather than true seizures, and the battery was discontinued. She underwent an MRI of the brain which was unremarkable. EEG didn't show any epileptogenic foci. Cardiology and electrophysiology were as well consulted and followed patient while hospitalized. Her metoprolol was discontinued due to hypotension, as well as her Lasix, and she was placed on sotalol with improvement in her heart rate. It is possible that her orthostatic hypotension and syncope is due to dehydration in setting of recent chemotherapy and radiation therapy as well  as 20 pound weight loss with Lasix. We'll change Lasix to as needed. She was  also suspected for adrenal insufficiency given a low a.m. cortisol level, and she underwent stimulation test which was essentially unremarkable. Due to persistent hypotension, patient was started on midodrine with improvement in her blood pressure. Her orthostatics improved, and patient was no longer asymptomatic. She was able to ambulate in the hallway without further lightheadedness/dizziness, she was feeling back to baseline, and she was discharged home in stable condition to follow up closely as an outpatient. She will see her radiation oncologist as well as her oncologist for history of breast cancer in less than 1 week. I advised patient to follow-up with Dr. Lovena Le with cardiology in 1-2 weeks. Chronic kidney disease stage III - patient's creatinine returned to baseline, 1.6 on discharge. Coronary artery disease - no chest pain, continue aspirin and Plavix Chronic anemia - in the setting of malignancy and chronic kidney disease Breast cancer - outpatient follow-up Type 2 diabetes mellitus - continue insulin pump, follow-up with endocrinology as an outpatient  Discharge Instructions   Allergies as of 07/04/2016      Reactions   Amoxicillin Rash   Has patient had a PCN reaction causing immediate rash, facial/tongue/throat swelling, SOB or lightheadedness with hypotension: No Has patient had a PCN reaction causing severe rash involving mucus membranes or skin necrosis: Yes Has patient had a PCN reaction that required hospitalization No Has patient had a PCN reaction occurring within the last 10 years: No If all of the above answers are "NO", then may proceed with Cephalosporin use.   Imdur [isosorbide Dinitrate] Other (See Comments)   Headache/severe hypotension/Syncope   Latex Itching   Tetracycline Rash      Medication List    STOP taking these medications   metoprolol succinate 100 MG 24 hr tablet Commonly known as:  TOPROL-XL     TAKE these medications   albuterol 108 (90 Base)  MCG/ACT inhaler Commonly known as:  PROVENTIL HFA;VENTOLIN HFA Inhale 2 puffs into the lungs every 6 (six) hours as needed for wheezing or shortness of breath.   alendronate 70 MG tablet Commonly known as:  FOSAMAX Take 70 mg by mouth every Wednesday. Take with a full glass of water on an empty stomach.   aspirin 81 MG EC tablet Take 1 tablet (81 mg total) by mouth daily.   budesonide-formoterol 160-4.5 MCG/ACT inhaler Commonly known as:  SYMBICORT INHALE TWO PUFFS INTO LUNGS TWICE DAILY   clopidogrel 75 MG tablet Commonly known as:  PLAVIX Take 75 mg by mouth daily.   furosemide 80 MG tablet Commonly known as:  LASIX Take 0.5 tablets (40 mg total) by mouth daily as needed. 1 tablet in the AM and 1/2 tablet (40 mg) in the early afternoon. What changed:  how much to take  when to take this  reasons to take this   gabapentin 300 MG capsule Commonly known as:  NEURONTIN Take 1 capsule (300 mg total) by mouth 3 (three) times daily.   insulin lispro 100 UNIT/ML KiwkPen Commonly known as:  HUMALOG KWIKPEN INJECT 10 UNITS INTO THE SKIN WITH MEALS PLUS SLIDING SCALE What changed:  how much to take  how to take this  when to take this  reasons to take this  additional instructions   insulin lispro 100 UNIT/ML injection Commonly known as:  HUMALOG Inject 76 units daily with  V-GO pump What changed:  Another medication with the same name was changed. Make sure  you understand how and when to take each.   ipratropium-albuterol 0.5-2.5 (3) MG/3ML Soln Commonly known as:  DUONEB Take 3 mLs by nebulization 4 (four) times daily.   lidocaine-prilocaine cream Commonly known as:  EMLA Apply to affected area once What changed:  how much to take  how to take this  when to take this  reasons to take this  additional instructions   loratadine 10 MG tablet Commonly known as:  CLARITIN Take 10 mg by mouth daily.   midodrine 5 MG tablet Commonly known as:   PROAMATINE Take 1 tablet (5 mg total) by mouth 3 (three) times daily with meals.   potassium chloride SA 20 MEQ tablet Commonly known as:  KLOR-CON M20 Take 1 tablet (20 mEq total) by mouth daily as needed (when you take lasix). What changed:  when to take this  reasons to take this   rosuvastatin 40 MG tablet Commonly known as:  CRESTOR TAKE ONE TABLET BY MOUTH ONCE DAILY What changed:  See the new instructions.   sotalol 120 MG tablet Commonly known as:  BETAPACE Take 1 tablet (120 mg total) by mouth every 12 (twelve) hours.   TRULICITY 3.53 IR/4.4RX Sopn Generic drug:  Dulaglutide Inject 0.75 mg into the skin every Wednesday.   V-GO 40 Kit USE 1 PER DAY   ZETIA 10 MG tablet Generic drug:  ezetimibe Take 1 tablet by mouth  daily What changed:  See the new instructions.      Follow-up Information    Nicola Girt, DO. Schedule an appointment as soon as possible for a visit in 2 week(s).   Specialty:  Internal Medicine Why:  office will call patient Contact information: [address] [place] [address] [phone]        [name], MD Follow up on 07/11/2016.   Specialty:  Cardiology Why:  at 9:15 for hospital cardiology follow up. Please bring all of your medications to this appointment and arrive 15 minutes early. Contact information: [address] [place] [address] [phone]          Allergies  Allergen Reactions  . Amoxicillin Rash    Has patient had a PCN reaction causing immediate rash, facial/tongue/throat swelling, SOB or lightheadedness with hypotension: No Has patient had a PCN reaction causing severe rash involving mucus membranes or skin necrosis: Yes Has patient had a PCN reaction that required hospitalization No Has patient had a PCN reaction occurring within the last 10 years: No If all of the above answers are "NO", then may proceed with Cephalosporin use.   . Imdur [Isosorbide  Dinitrate] Other (See Comments)    Headache/severe hypotension/Syncope  . Latex Itching  . Tetracycline Rash    Consultations:  Neurology  Cardiology  Procedures/Studies:  Dg Chest 2 View  Result Date: 06/29/2016 CLINICAL DATA:  Seizures today with loss of consciousness. Undergoing radiation for breast cancer. EXAM: CHEST  2 VIEW COMPARISON:  06/22/2016 and 04/25/2016. FINDINGS: The left subclavian Port-A-Cath tip appears unchanged near the cavoatrial junction. The heart size and mediastinal contours are stable post CABG and aortic valve replacement. The lungs are clear. There is no pleural effusion or pneumothorax. Old fracture of the proximal right humerus noted. No worrisome osseous findings. IMPRESSION: Stable postoperative chest. No acute findings or explanation for seizures. Electronically Signed   By: Richardean Sale M.D.   On: 06/29/2016 16:00   Dg Chest 2 View  Result Date: 06/22/2016 CLINICAL DATA:  Cough,  shortness of breath, chest tightness for the past 2 days. Syncopal episode. EXAM: CHEST  2 VIEW COMPARISON:  04/25/2016. FINDINGS: Normal sized heart with an interval decrease in size. Stable post CABG changes and prosthetic aortic valve. Stable left subclavian porta catheter. Clear lungs with normal vascularity. Mild thoracic spine degenerative changes. Aortic calcifications. Old right humeral neck fracture and avascular necrosis of the right humeral head. IMPRESSION: No acute abnormality.  Aortic atherosclerosis. Electronically Signed   By: Claudie Revering M.D.   On: 06/22/2016 12:20   Dg Cervical Spine 2 Or 3 Views  Result Date: 07/02/2016 CLINICAL DATA:  Headache.  Pain.  No injury. EXAM: CERVICAL SPINE - 2-3 VIEW COMPARISON:  MRI 06/29/2016.  CT 06/29/2016. FINDINGS: Diffuse osteopenia. No acute soft tissue bony abnormality. Ligamentous ossification noted. Lower cervical spine poorly visualized. Left subclavian line noted. Prior median sternotomy and CABG. Bilateral carotid  vascular calcification IMPRESSION: 1. Diffuse osteopenia and degenerative change. Lower cervical spine difficult visualized. No acute bony abnormality. 2.  Bilateral carotid vascular disease . Electronically Signed   By: Marcello Moores  Register   On: 07/02/2016 06:58   Ct Head Wo Contrast  Result Date: 06/29/2016 CLINICAL DATA:  Seizure-like activity today. History of breast cancer. Chemotherapy completed. Radiation therapy ongoing. EXAM: CT HEAD WITHOUT CONTRAST TECHNIQUE: Contiguous axial images were obtained from the base of the skull through the vertex without intravenous contrast. COMPARISON:  CT head 03/23/2010. FINDINGS: Brain: There is no evidence of acute intracranial hemorrhage, mass lesion, brain edema or extra-axial fluid collection. Stable mild/age-appropriate atrophy. There is no CT evidence of acute cortical infarction. Vascular: Intracranial vascular calcifications are present. Skull: Stable calvarial hyperostosis. No focal lytic or blastic lesion identified. No evidence of skull fracture. Sinuses/Orbits: The visualized paranasal sinuses and mastoid air cells are clear. No orbital abnormalities are seen. Other: None. IMPRESSION: Stable examination without acute findings or explanation for seizures. Electronically Signed   By: Richardean Sale M.D.   On: 06/29/2016 15:27   Mr Jeri Cos And Wo Contrast  Result Date: 06/29/2016 CLINICAL DATA:  Seizure at Radiation today, history of breast cancer, on chemoradiation. Seizure 3 years ago. History of diabetes, atrial fibrillation, hypertension. EXAM: MRI HEAD WITHOUT AND WITH CONTRAST TECHNIQUE: Multiplanar, multiecho pulse sequences of the brain and surrounding structures were obtained without and with intravenous contrast. CONTRAST:  66m MULTIHANCE GADOBENATE DIMEGLUMINE 529 MG/ML IV SOLN COMPARISON:  CT HEAD June 29, 2016 at 1509 hours FINDINGS: Moderately motion degraded noncontrast examination. Post gadolinium sequences are moderate to severely  motion degraded. INTRACRANIAL CONTENTS: No reduced diffusion to suggest acute ischemia or hypercellular tumor. No susceptibility artifact to suggest hemorrhage. The ventricles and sulci are normal for patient's age. No suspicious parenchymal signal, masses, mass effect. No abnormal intraparenchymal or extra-axial enhancement. No abnormal extra-axial fluid collections. No extra-axial masses. VASCULAR: Normal major intracranial vascular flow voids present at skull base. SKULL AND UPPER CERVICAL SPINE: No abnormal sellar expansion. No suspicious calvarial bone marrow signal. Craniocervical junction maintained. SINUSES/ORBITS: Mild paranasal sinus mucosal thickening. Mastoid air cells are well aerated. Status post bilateral ocular lens implants. The included ocular globes and orbital contents are non-suspicious. OTHER: None. IMPRESSION: Negative motion degraded MRI head with and without contrast, for age (no intracranial metastasis given moderate to severely motion degraded post gadolinium sequences). Electronically Signed   By: CElon AlasM.D.   On: 06/29/2016 21:14   Nm Pulmonary Perf And Vent  Result Date: 06/22/2016 CLINICAL DATA:  Chest tightness with the breast. Cough and fever.  History of breast carcinoma. EXAM: NUCLEAR MEDICINE VENTILATION - PERFUSION LUNG SCAN TECHNIQUE: Ventilation images were obtained in multiple projections using inhaled aerosol Tc-22mDTPA. Perfusion images were obtained in multiple projections after intravenous injection of Tc-935mAA. RADIOPHARMACEUTICALS:  31.0 mCi Technetium-9963mPA aerosol inhalation and 4.1 mCi Technetium-84m57m IV COMPARISON:  Current chest radiograph. Previous ventilation-perfusion study dated 04/09/2016. FINDINGS: Ventilation: There is heterogeneous radiotracer accumulation in the lungs without a discrete segmental appearing defect. Perfusion: There is also heterogeneous radiotracer accumulation to the lungs on profusion imaging similar to that seen  with ventilation, but less prominent, without a segmental type defect. IMPRESSION: 1. No segmental type profusion defect is seen to suggest a pulmonary thromboembolism. 2. Patchy bilateral areas of nonsegmental relative decreased ventilation and perfusion. This suggests small airways disease. Electronically Signed   By: DaviLajean Manes.   On: 06/22/2016 18:12     Subjective: - no chest pain, shortness of breath, no abdominal pain, nausea or vomiting.   Discharge Exam: Vitals:   07/04/16 0736 07/04/16 0843  BP: (!) 106/50 110/60  Pulse: (!) 58 75  Resp: 16   Temp: 97.7 F (36.5 C)    Vitals:   07/04/16 0445 07/04/16 0736 07/04/16 0743 07/04/16 0843  BP: (!) 94/41 (!) 106/50  110/60  Pulse: 66 (!) 58  75  Resp:  16    Temp: 97.5 F (36.4 C) 97.7 F (36.5 C)    TempSrc: Oral Oral    SpO2: 98% 97% 91%   Weight:      Height:        General: Pt is alert, awake, not in acute distress Cardiovascular: RRR, S1/S2 +, no rubs, no gallops Respiratory: CTA bilaterally, no wheezing, no rhonchi Abdominal: Soft, NT, ND, bowel sounds + Extremities: no edema, no cyanosis    The results of significant diagnostics from this hospitalization (including imaging, microbiology, ancillary and laboratory) are listed below for reference.     Microbiology: No results found for this or any previous visit (from the past 240 hour(s)).   Labs: BNP (last 3 results)  Recent Labs  05/08/16 1144 05/23/16 1143 06/22/16 2109  BNP 209.4* 243.3* 257.191.4Basic Metabolic Panel:  Recent Labs Lab 06/29/16 1430 06/30/16 0154 07/02/16 1258 07/03/16 0335 07/04/16 0431  NA 139 141 137 140 139  K 3.8 3.5 4.3 4.2 4.1  CL 102 103 99* 101 101  CO2 30 30 29 29 28   GLUCOSE 188* 164* 157* 134* 116*  BUN 38* 36* 46* 46* 47*  CREATININE 1.53* 1.60* 1.94* 1.93* 1.66*  CALCIUM 9.3 9.4 9.2 9.3 9.3   Liver Function Tests: No results for input(s): AST, ALT, ALKPHOS, BILITOT, PROT, ALBUMIN in the last 168  hours. No results for input(s): LIPASE, AMYLASE in the last 168 hours. No results for input(s): AMMONIA in the last 168 hours. CBC:  Recent Labs Lab 06/29/16 1430 06/30/16 0154 07/02/16 1258 07/03/16 0335 07/04/16 0431  WBC 9.6 8.5 8.0 7.4 6.5  HGB 12.6 11.3* 11.6* 11.1* 10.7*  HCT 36.7 34.9* 36.0 34.5* 33.2*  MCV 89.5 92.6 92.8 93.0 91.7  PLT 199 179 195 166 160   Cardiac Enzymes: No results for input(s): CKTOTAL, CKMB, CKMBINDEX, TROPONINI in the last 168 hours. BNP: Invalid input(s): POCBNP CBG:  Recent Labs Lab 07/03/16 1613 07/03/16 2046 07/04/16 0128 07/04/16 0625 07/04/16 1115  GLUCAP 113* 224* 93 100* 185*   D-Dimer No results for input(s): DDIMER in the last 72 hours. Hgb A1c No results for  input(s): HGBA1C in the last 72 hours. Lipid Profile No results for input(s): CHOL, HDL, LDLCALC, TRIG, CHOLHDL, LDLDIRECT in the last 72 hours. Thyroid function studies  Recent Labs  07/02/16 1626  TSH 1.431   Anemia work up No results for input(s): VITAMINB12, FOLATE, FERRITIN, TIBC, IRON, RETICCTPCT in the last 72 hours. Urinalysis    Component Value Date/Time   COLORURINE YELLOW 06/22/2016 1207   APPEARANCEUR HAZY (A) 06/22/2016 1207   LABSPEC 1.012 06/22/2016 1207   LABSPEC 1.020 02/24/2016 1119   PHURINE 6.0 06/22/2016 1207   GLUCOSEU 50 (A) 06/22/2016 1207   GLUCOSEU Negative 02/24/2016 1119   HGBUR SMALL (A) 06/22/2016 1207   BILIRUBINUR NEGATIVE 06/22/2016 1207   BILIRUBINUR Negative 02/24/2016 1119   KETONESUR NEGATIVE 06/22/2016 1207   PROTEINUR 30 (A) 06/22/2016 1207   UROBILINOGEN 0.2 02/24/2016 1119   NITRITE NEGATIVE 06/22/2016 1207   LEUKOCYTESUR NEGATIVE 06/22/2016 1207   LEUKOCYTESUR Trace 02/24/2016 1119   Sepsis Labs Invalid input(s): PROCALCITONIN,  WBC,  LACTICIDVEN Microbiology No results found for this or any previous visit (from the past 240 hour(s)).   Time coordinating discharge: Over 30 minutes  SIGNED:  Marzetta Board, MD  [facility] 07/04/2016, 4:20 PM Pager [phone]  If 7PM-7AM, please contact night-coverage [email or web address] Password [email or web address]

## 2016-07-04 NOTE — Care Management Important Message (Signed)
Important Message  Patient Details  Name: Hannah [name] MRN: [id number] Date of Birth: 09-15-1945   Medicare Important Message Given:  Yes    [name] 07/04/2016, 12:01 PM

## 2016-07-04 NOTE — Progress Notes (Signed)
Arrived to draw ACTH 30 minute draw. Pt states med wasn't given until 0500. Will return at 0530 for 30 minute draw.

## 2016-07-05 ENCOUNTER — Ambulatory Visit: Payer: Medicare Other

## 2016-07-05 ENCOUNTER — Encounter: Payer: Self-pay | Admitting: Hematology and Oncology

## 2016-07-06 ENCOUNTER — Telehealth: Payer: Self-pay | Admitting: Oncology

## 2016-07-06 ENCOUNTER — Ambulatory Visit: Payer: Medicare Other

## 2016-07-06 NOTE — Telephone Encounter (Signed)
Called Hannah [name] to see how she is feeling.  She said she is still very weak and is going to restart radiation on Monday. She said her skin has healed except for "one little spot."  She was given a cloth that "drew moisture out" in the hospital and thinks the radiaplex cream was keeping her skin too wet.  Asked if she could bring the cloth in on Monday.  Hannah [name] verbalized understanding and agreement.

## 2016-07-09 ENCOUNTER — Ambulatory Visit: Payer: Medicare Other

## 2016-07-09 ENCOUNTER — Encounter: Payer: Self-pay | Admitting: Behavioral Health

## 2016-07-09 ENCOUNTER — Ambulatory Visit
Admission: RE | Admit: 2016-07-09 | Discharge: 2016-07-09 | Disposition: A | Discharge status: Home / Self Care | Payer: Medicare Other | Source: Ambulatory Visit | Attending: Radiation Oncology | Admitting: Radiation Oncology

## 2016-07-09 ENCOUNTER — Telehealth: Payer: Self-pay | Admitting: Behavioral Health

## 2016-07-09 DIAGNOSIS — Z9221 Personal history of antineoplastic chemotherapy: Secondary | ICD-10-CM | POA: Diagnosis not present

## 2016-07-09 DIAGNOSIS — Z7982 Long term (current) use of aspirin: Secondary | ICD-10-CM | POA: Diagnosis not present

## 2016-07-09 DIAGNOSIS — Z881 Allergy status to other antibiotic agents status: Secondary | ICD-10-CM | POA: Diagnosis not present

## 2016-07-09 DIAGNOSIS — Z79899 Other long term (current) drug therapy: Secondary | ICD-10-CM | POA: Diagnosis not present

## 2016-07-09 DIAGNOSIS — Z7902 Long term (current) use of antithrombotics/antiplatelets: Secondary | ICD-10-CM | POA: Diagnosis not present

## 2016-07-09 DIAGNOSIS — Z170 Estrogen receptor positive status [ER+]: Secondary | ICD-10-CM | POA: Diagnosis not present

## 2016-07-09 DIAGNOSIS — Z510 Encounter for antineoplastic radiation therapy: Secondary | ICD-10-CM | POA: Diagnosis present

## 2016-07-09 DIAGNOSIS — C50211 Malignant neoplasm of upper-inner quadrant of right female breast: Secondary | ICD-10-CM | POA: Diagnosis not present

## 2016-07-09 DIAGNOSIS — Z7983 Long term (current) use of bisphosphonates: Secondary | ICD-10-CM | POA: Diagnosis not present

## 2016-07-09 DIAGNOSIS — Z794 Long term (current) use of insulin: Secondary | ICD-10-CM | POA: Diagnosis not present

## 2016-07-09 DIAGNOSIS — Z91040 Latex allergy status: Secondary | ICD-10-CM | POA: Diagnosis not present

## 2016-07-09 DIAGNOSIS — Z9641 Presence of insulin pump (external) (internal): Secondary | ICD-10-CM | POA: Diagnosis not present

## 2016-07-09 DIAGNOSIS — Z888 Allergy status to other drugs, medicaments and biological substances status: Secondary | ICD-10-CM | POA: Diagnosis not present

## 2016-07-10 ENCOUNTER — Ambulatory Visit (INDEPENDENT_AMBULATORY_CARE_PROVIDER_SITE_OTHER): Payer: Medicare Other | Admitting: Medical

## 2016-07-10 ENCOUNTER — Ambulatory Visit: Payer: Medicare Other | Admitting: Internal Medicine

## 2016-07-10 ENCOUNTER — Ambulatory Visit: Payer: Medicare Other

## 2016-07-10 ENCOUNTER — Encounter: Payer: Self-pay | Admitting: Radiation Oncology

## 2016-07-10 ENCOUNTER — Ambulatory Visit
Admission: RE | Admit: 2016-07-10 | Discharge: 2016-07-10 | Disposition: A | Discharge status: Home / Self Care | Payer: Medicare Other | Source: Ambulatory Visit | Attending: Radiation Oncology | Admitting: Radiation Oncology

## 2016-07-10 ENCOUNTER — Encounter: Payer: Self-pay | Admitting: Medical

## 2016-07-10 VITALS — BP 154/75 | HR 63 | Temp 97.7°F | Ht 65.0 in | Wt 201.8 lb

## 2016-07-10 VITALS — BP 124/80 | HR 71 | Temp 98.1°F | Resp 16 | Ht 65.0 in | Wt 202.0 lb

## 2016-07-10 DIAGNOSIS — E785 Hyperlipidemia, unspecified: Secondary | ICD-10-CM

## 2016-07-10 DIAGNOSIS — N289 Disorder of kidney and ureter, unspecified: Secondary | ICD-10-CM

## 2016-07-10 DIAGNOSIS — I4891 Unspecified atrial fibrillation: Secondary | ICD-10-CM

## 2016-07-10 DIAGNOSIS — C50919 Malignant neoplasm of unspecified site of unspecified female breast: Secondary | ICD-10-CM

## 2016-07-10 DIAGNOSIS — C50211 Malignant neoplasm of upper-inner quadrant of right female breast: Secondary | ICD-10-CM

## 2016-07-10 DIAGNOSIS — D649 Anemia, unspecified: Secondary | ICD-10-CM

## 2016-07-10 DIAGNOSIS — Z510 Encounter for antineoplastic radiation therapy: Secondary | ICD-10-CM | POA: Diagnosis not present

## 2016-07-10 DIAGNOSIS — R55 Syncope and collapse: Secondary | ICD-10-CM | POA: Diagnosis not present

## 2016-07-10 DIAGNOSIS — Z952 Presence of prosthetic heart valve: Secondary | ICD-10-CM

## 2016-07-10 DIAGNOSIS — I509 Heart failure, unspecified: Secondary | ICD-10-CM

## 2016-07-10 DIAGNOSIS — E108 Type 1 diabetes mellitus with unspecified complications: Secondary | ICD-10-CM

## 2016-07-10 LAB — IRON AND TIBC
%SAT: 33 % (ref 11–50)
Iron: 82 ug/dL (ref 45–160)
TIBC: 246 ug/dL — ABNORMAL LOW (ref 250–450)
UIBC: 164 ug/dL (ref 125–400)

## 2016-07-10 LAB — CBC WITH DIFFERENTIAL/PLATELET
Basophils Absolute: 0.1 K/uL (ref 0.0–0.1)
Basophils Relative: 2.2 % (ref 0.0–3.0)
Eosinophils Absolute: 0.5 K/uL (ref 0.0–0.7)
Eosinophils Relative: 8.8 % — ABNORMAL HIGH (ref 0.0–5.0)
HCT: 30.9 % — ABNORMAL LOW (ref 36.0–46.0)
Hemoglobin: 10.4 g/dL — ABNORMAL LOW (ref 12.0–15.0)
Lymphocytes Relative: 13.6 % (ref 12.0–46.0)
Lymphs Abs: 0.7 K/uL (ref 0.7–4.0)
MCHC: 33.6 g/dL (ref 30.0–36.0)
MCV: 92.1 fl (ref 78.0–100.0)
Monocytes Absolute: 0.6 K/uL (ref 0.1–1.0)
Monocytes Relative: 12.1 % — ABNORMAL HIGH (ref 3.0–12.0)
Neutro Abs: 3.3 K/uL (ref 1.4–7.7)
Neutrophils Relative %: 63.3 % (ref 43.0–77.0)
Platelets: 129 K/uL — ABNORMAL LOW (ref 150.0–400.0)
RBC: 3.35 Mil/uL — ABNORMAL LOW (ref 3.87–5.11)
RDW: 13.7 % (ref 11.5–15.5)
WBC: 5.2 K/uL (ref 4.0–10.5)

## 2016-07-10 LAB — COMPREHENSIVE METABOLIC PANEL WITH GFR
ALT: 19 U/L (ref 0–35)
AST: 21 U/L (ref 0–37)
Albumin: 3.2 g/dL — ABNORMAL LOW (ref 3.5–5.2)
Alkaline Phosphatase: 53 U/L (ref 39–117)
BUN: 20 mg/dL (ref 6–23)
CO2: 34 meq/L — ABNORMAL HIGH (ref 19–32)
Calcium: 9.4 mg/dL (ref 8.4–10.5)
Chloride: 108 meq/L (ref 96–112)
Creatinine, Ser: 1.17 mg/dL (ref 0.40–1.20)
GFR: 48.49 mL/min — ABNORMAL LOW
Glucose, Bld: 112 mg/dL — ABNORMAL HIGH (ref 70–99)
Potassium: 4.7 meq/L (ref 3.5–5.1)
Sodium: 142 meq/L (ref 135–145)
Total Bilirubin: 0.6 mg/dL (ref 0.2–1.2)
Total Protein: 6.2 g/dL (ref 6.0–8.3)

## 2016-07-10 LAB — LIPID PANEL
Cholesterol: 137 mg/dL (ref 0–200)
HDL: 33.7 mg/dL — ABNORMAL LOW (ref >39.00)
LDL Cholesterol: 82 mg/dL (ref 0–99)
NonHDL: 103.32
Total CHOL/HDL Ratio: 4
Triglycerides: 107 mg/dL (ref 0.0–149.0)
VLDL: 21.4 mg/dL (ref 0.0–40.0)

## 2016-07-10 NOTE — Progress Notes (Signed)
Hannah [name] has completed 21 fractions to her right breast.  She denies having pain.  She reports having a good energy level. She is using radiaplex.  She did use a "wicket" that she was given in the hospital under her right breast.  The skin on her right breast is pink with a healing peeling area under her breast.  She does have scattered scabbed areas on her upper right breast and in between her breast.  BP (!) 154/75 (BP Location: Left Arm, Patient Position: Sitting)   Pulse 63   Temp 97.7 F (36.5 C) (Oral)   Ht 5\' 5"  (1.651 m)   Wt 201 lb 12.8 oz (91.5 kg)   SpO2 100%   BMI 33.58 kg/m    Wt Readings from Last 3 Encounters:  07/10/16 201 lb 12.8 oz (91.5 kg)  07/10/16 202 lb (91.6 kg)  07/03/16 193 lb 3.2 oz (87.6 kg)

## 2016-07-10 NOTE — Telephone Encounter (Signed)
Pre-Visit Call completed with patient and chart updated.   Pre-Visit Info documented in Specialty Comments under [employer].    

## 2016-07-10 NOTE — Patient Instructions (Addendum)
You appear stable post hospitalization regarding syncope related to low blood pressure. Continue current meds. See cardiologist. They will follow your afib and cardiomyopathy hx.  Will check lipids today, cbc(for anemia) and recheck cmp(for kidney function).  Do plan to go ahead and refer you to nephrologist in event your kidney function to decline.(bp controlled and sugar controlled will help preserve kidney function)  Please sign release of record information. Please call your oncologist and ask about you getting the flu vaccine as he in past wanted you to hold off. If they ok vaccine then call here for nurse vaccine visit.  Follow up in 3 months or as needed

## 2016-07-10 NOTE — Progress Notes (Signed)
  Radiation Oncology         [phone] ________________________________  Name: Hannah [name] MRN: [id number]  Date: 07/10/2016  DOB: [birth year]   Weekly Radiation Therapy Management    ICD-9-CM ICD-10-CM   1. Malignant neoplasm of upper-inner quadrant of right female breast, unspecified estrogen receptor status ([place]) 174.2 C50.211      Current Dose: 37.8 Gy     Planned Dose:  60.4 Gy  Narrative . . . . . . . . The patient presents for routine under treatment assessment.                                   Weekly rad txs right breast, 21/33 completed. She denies having pain. The patient reports a good energy level. She is using Radiaplex as directed, and reports using a "wicket" which she was given in the hospital under her right breast. Nursing notes the skin to the right breast is pink with a healing peeling area under the breast, as well as scattered scabbed areas on the upper right breast and in between the breasts. The patient was on a hiatus from treatment as she was hospitalized for the flu and fainting spells. She is eager to continue with treatment now that she feels better.                                  Set-up films were reviewed.                                 The chart was checked. Physical Findings. . .  height is 5\' 5"  (1.651 m) and weight is 201 lb 12.8 oz (91.5 kg). Her oral temperature is 97.7 F (36.5 C). Her blood pressure is 154/75 (abnormal) and her pulse is 63. Her oxygen saturation is 100%.  Weight essentially stable. Lungs are clear to auscultation bilaterally. Heart has regular rate and rhythm. Some erythema, but moist desquamation is healed at this time. Impression . . . . . . . The patient is tolerating radiation. Plan . . . . . . . . . . . . Continue treatment as planned. ________________________________   Blair Promise, PhD, MD  This document serves as a record of services personally performed by Gery Pray, MD. It was created on his behalf by Maryla Morrow, a trained medical scribe. The creation of this record is based on the scribe's personal observations and the provider's statements to them. This document has been checked and approved by the attending provider.

## 2016-07-10 NOTE — Progress Notes (Signed)
Pre visit review using our clinic review tool, if applicable. No additional management support is needed unless otherwise documented below in the visit note/SLS  

## 2016-07-10 NOTE — Progress Notes (Signed)
Subjective:    Patient ID: Hannah [name], female    DOB: 1945-10-17, 71 y.o.   MRN: [id number]  HPI  I have reviewed pt PMH, PSH, FH, Social History and Surgical History.  Med history.  Htn- Pt states in past was high in past. But when she was on metoprolol dropped it too low. She had syncope and metoprolol was dropped from the list. Pt was given sotalol and midodrine. Pt bp better since metoprolol dc'd. Pt sees cardiologist tomorrow(Dr. Lovena Le)  Pt syncope worked up neg mri and neg eeg in hospital  Atrial fibrillation- had this for a while. Pt rate is controlled. Pt is on plavix.  Hyperlipidemia- checked maybe 2 months ago. Pt not told that was high.  Renal insufficiency- Pt gfr is low. Pt does not see nephroloogist.  Cardiomyopathy- bnp was 243. But clinical no signs or symptoms presently of acute flare.  Diabetes- last a1-c was 6.4.n insulin pump.  Iron deficiency anemia pr record.-  10.7/22.2 hb/hct.  Aortic valve replacement-2008.  Hx of MI-  Sept 2014 troponin was elevated.  Pt did not have flu vaccine this year. Pt oncologist per pt did not recommend. She did get the flu. Recommend she call and ask her oncologist should she get now.   Past Medical History:  Diagnosis Date  . Abnormally small mouth   . Anemia   . Bilateral carotid artery stenosis    Bilateral ICA 40-59%/  >50% LECA  . Breast cancer ([place])   . Breast cancer of upper-inner quadrant of right female breast ([place]) 12/16/2015  . CAD (coronary artery disease) cardiologist-  dr Aundra Dubin   Remote CABG in 1998 with redo in 2009  . CHF (congestive heart failure) (HCC)    EF is low normal at 50 to 55% per echo in Jan. 2011  . GERD (gastroesophageal reflux disease)   . Hearing loss of right ear   . History of non-ST elevation myocardial infarction (NSTEMI)    Sept 2014--  thought to be type II HTN w/ LHC without infarct related artery and patent grafts  . Hyperlipidemia   . Hypertension    Resolved.  . Iron  deficiency anemia   . Moderate persistent asthma    pulmologist-  Dr. Malvin Johns  . Nonischemic cardiomyopathy ([place])   . PAF (paroxysmal atrial fibrillation) ([place])   . Psoriasis    right leg  . Renal calculus, right   . Renal insufficiency, mild   . S/P AVR    prosthesis valve placement 2009 at same time re-do CABG  . Stroke [facility])    residual rt hearing loss  . Type 2 diabetes mellitus ([place])    monitored by dr Dwyane Dee     Social History   Social History  . Marital status: Married    Spouse name: N/A  . Number of children: 2  . Years of education: N/A   Occupational History  . retired    Social History Main Topics  . Smoking status: Never Smoker  . Smokeless tobacco: Never Used  . Alcohol use No  . Drug use: No  . Sexual activity: Yes    Birth control/ protection: None   Other Topics Concern  . Not on file   Social History Narrative  . No narrative on file    Past Surgical History:  Procedure Laterality Date  . AORTIC VALVE REPLACEMENT  2009   [facility] (done same time is CABG)  . BREAST LUMPECTOMY WITH  RADIOACTIVE SEED AND SENTINEL LYMPH NODE BIOPSY Right 01/18/2016   Procedure: RIGHT BREAST LUMPECTOMY WITH RADIOACTIVE SEED AND SENTINEL LYMPH NODE BIOPSY;  Surgeon: Alphonsa Overall, MD;  Location: [facility];  Service: General;  Laterality: Right;  . CARDIAC CATHETERIZATION  03/23/2008   Pre-redo CABG: L main OK, LAD (T), CFX (T), OM1 99%, RCA (T), LIMA-LAD OK, SVG-OM(?3) OK w/ little florw to OM2, SVG-RCA OK. EF NL  . CARPAL TUNNEL RELEASE    . COLONOSCOPY    . CORONARY ARTERY BYPASS GRAFT  1998 &  re-do 2009   Had LIMA to DX/LAD, SVG to 2 marginal branches and SVG to [facility] originally; SVG to 3rd OM and PD at time of redo  . CYSTOSCOPY W/ URETERAL STENT PLACEMENT Right 12/20/2014   Procedure: CYSTOSCOPY WITH RETROGRADE PYELOGRAM/URETERAL STENT PLACEMENT;  Surgeon: Cleon Gustin, MD;  Location[facility];  Service: Urology;   Laterality: Right;  . EYE SURGERY Bilateral    cataracts  . HOLMIUM LASER APPLICATION Right 2/50/5397   Procedure:  HOLMIUM LASER LITHOTRIPSY;  Surgeon: Cleon Gustin, MD;  Location: [facility];  Service: Urology;  Laterality: Right;  . LEFT HEART CATHETERIZATION WITH CORONARY/GRAFT ANGIOGRAM N/A 02/23/2013   Procedure: LEFT HEART CATHETERIZATION WITH Beatrix Fetters;  Surgeon: Blane Ohara, MD;  Location: [facility] CATH LAB;  Service: Cardiovascular;  Laterality: N/A;  . PORTACATH PLACEMENT Left 01/18/2016   Procedure: INSERTION PORT-A-CATH;  Surgeon: Alphonsa Overall, MD;  Location: [place];  Service: General;  Laterality: Left;  . TONSILLECTOMY    . TRANSTHORACIC ECHOCARDIOGRAM  02-24-2013      mild LVH,  ef 50-55%/  AV bioprosthesis was present with very mild stenosis and no regurg., mean grandient 78mHg, peak grandient 292mg /  mild MR/  mild LAE and RAE/  moderate TR  . TUBAL LIGATION      Family History  Problem Relation Age of Onset  . Heart disease Father   . Heart failure Father   . Diabetes Maternal Grandmother   . Heart disease Maternal Grandmother   . Diabetes Son   . Healthy Brother     #1  . Heart attack Brother     #2  . Heart disease Brother     #2    Allergies  Allergen Reactions  . Amoxicillin Rash    Has patient had a PCN reaction causing immediate rash, facial/tongue/throat swelling, SOB or lightheadedness with hypotension: No Has patient had a PCN reaction causing severe rash involving mucus membranes or skin necrosis: Yes Has patient had a PCN reaction that required hospitalization No Has patient had a PCN reaction occurring within the last 10 years: No If all of the above answers are "NO", then may proceed with Cephalosporin use.   . Imdur [Isosorbide Dinitrate] Other (See Comments)    Headache/severe hypotension/Syncope  . Latex Itching  . Tetracycline Rash    Current Outpatient Prescriptions on File Prior to Visit  Medication  Sig Dispense Refill  . albuterol (PROVENTIL HFA;VENTOLIN HFA) 108 (90 Base) MCG/ACT inhaler Inhale 2 puffs into the lungs every 6 (six) hours as needed for wheezing or shortness of breath.    . Marland Kitchenlendronate (FOSAMAX) 70 MG tablet Take 70 mg by mouth every Wednesday. Take with a full glass of water on an empty stomach.     . Marland Kitchenspirin EC 81 MG EC tablet Take 1 tablet (81 mg total) by mouth daily.    . budesonide-formoterol (SYMBICORT) 160-4.5 MCG/ACT inhaler INHALE TWO PUFFS  INTO LUNGS TWICE DAILY 1 Inhaler 11  . clopidogrel (PLAVIX) 75 MG tablet Take 75 mg by mouth daily.    . furosemide (LASIX) 80 MG tablet Take 0.5 tablets (40 mg total) by mouth daily as needed. 1 tablet in the AM and 1/2 tablet (40 mg) in the early afternoon. 90 tablet 3  . gabapentin (NEURONTIN) 300 MG capsule Take 1 capsule (300 mg total) by mouth 3 (three) times daily. 90 capsule 3  . Insulin Disposable Pump (V-GO 40) KIT USE 1 PER DAY 90 kit 1  . insulin lispro (HUMALOG KWIKPEN) 100 UNIT/ML KiwkPen INJECT 10 UNITS INTO THE SKIN WITH MEALS PLUS SLIDING SCALE (Patient taking differently: Inject 10-12 Units into the skin daily as needed. per SLIDING SCALE) 15 mL 2  . insulin lispro (HUMALOG) 100 UNIT/ML injection Inject 76 units daily with  V-GO pump 70 mL 2  . ipratropium-albuterol (DUONEB) 0.5-2.5 (3) MG/3ML SOLN Take 3 mLs by nebulization 4 (four) times daily. 360 mL 0  . lidocaine-prilocaine (EMLA) cream Apply to affected area once (Patient taking differently: Apply 1 application topically daily as needed (port access). Apply to affected area once) 30 g 3  . loratadine (CLARITIN) 10 MG tablet Take 10 mg by mouth daily.    . midodrine (PROAMATINE) 5 MG tablet Take 1 tablet (5 mg total) by mouth 3 (three) times daily with meals. 90 tablet 1  . potassium chloride SA (KLOR-CON M20) 20 MEQ tablet Take 1 tablet (20 mEq total) by mouth daily as needed (when you take lasix). 60 tablet 3  . rosuvastatin (CRESTOR) 40 MG tablet TAKE ONE  TABLET BY MOUTH ONCE DAILY (Patient taking differently: TAKE 25m TABLET BY MOUTH ONCE DAILY) 30 tablet 11  . sotalol (BETAPACE) 120 MG tablet Take 1 tablet (120 mg total) by mouth every 12 (twelve) hours. 60 tablet 1  . TRULICITY 01.32MGM/0.1UUSOPN Inject 0.75 mg into the skin every Wednesday.     .Marland KitchenZETIA 10 MG tablet Take 1 tablet by mouth  daily (Patient taking differently: Take 122mby mouth once daily) 90 tablet 2   No current facility-administered medications on file prior to visit.     BP 124/80 (BP Location: Right Arm, Patient Position: Sitting, Cuff Size: Large)   Pulse 71   Temp 98.1 F (36.7 C) (Oral)   Resp 16   Ht _0  (1.651 m)   Wt 202 lb (91.6 kg)   SpO2 100%   BMI 33.61 kg/m    Review of Systems  Constitutional: Negative for chills, fatigue and fever.  HENT: Negative for congestion, sinus pain and sinus pressure.   Respiratory: Negative for cough, chest tightness, shortness of breath and wheezing.   Cardiovascular: Negative for chest pain and palpitations.  Gastrointestinal: Negative for abdominal pain, diarrhea, nausea and vomiting.  Genitourinary: Negative for dysuria.  Musculoskeletal: Negative for back pain.  Skin: Negative for rash.  Neurological: Negative for dizziness, weakness, numbness and headaches.  Hematological: Negative for adenopathy. Does not bruise/bleed easily.  Psychiatric/Behavioral: Negative for behavioral problems.       Objective:   Physical Exam  General Mental Status- Alert. General Appearance- Not in acute distress.   Skin General: Color- Normal Color. Moisture- Normal Moisture.  Neck Carotid Arteries- Normal color. Moisture- Normal Moisture. No carotid bruits. No JVD.  Chest and Lung Exam Auscultation: Breath Sounds:-Normal.  Cardiovascular Auscultation:Rythm- Regular. Murmurs & Other Heart Sounds:Auscultation of the heart reveals- No Murmurs.  Abdomen Inspection:-Inspeection Normal. Palpation/Percussion:Note:No  mass. Palpation and  Percussion of the abdomen reveal- Non Tender, Non Distended + BS, no rebound or guarding.    Neurologic Cranial Nerve exam:- CN III-XII intact(No nystagmus), symmetric smile. tStrength:- 5/5 equal and symmetric strength both upper and lower extremities.   Lower ext- no pedal edema.      Assessment & Plan:  You appear stable post hospitalization regarding syncope related to low blood pressure. Continue current meds. See cardiologist. They will follow your afib and cardiomyopathy hx.  Will check lipids today, cbc(for anemia) and recheck cmp(for kidney function).  Do plan to go ahead and refer you to nephrologist in event your kidney function to decline.(bp controlled and sugar controlled will help preserve kidney function)   Please sign release of record information. Please call your oncologist and ask about you getting the flu vaccine as he in past wanted you to hold off. If they ok vaccine then call here for nurse vaccine visit.  Follow up in 3 months or as needed  Alpheus Stiff, Percell Miller, [employer]

## 2016-07-11 ENCOUNTER — Ambulatory Visit
Admission: RE | Admit: 2016-07-11 | Discharge: 2016-07-11 | Disposition: A | Discharge status: Home / Self Care | Payer: Medicare Other | Source: Ambulatory Visit | Attending: Radiation Oncology | Admitting: Radiation Oncology

## 2016-07-11 ENCOUNTER — Ambulatory Visit: Payer: Medicare Other

## 2016-07-11 ENCOUNTER — Encounter: Payer: Self-pay | Admitting: Internal Medicine

## 2016-07-11 ENCOUNTER — Ambulatory Visit (INDEPENDENT_AMBULATORY_CARE_PROVIDER_SITE_OTHER): Payer: Medicare Other | Admitting: Internal Medicine

## 2016-07-11 ENCOUNTER — Ambulatory Visit: Payer: Medicare Other | Admitting: Internal Medicine

## 2016-07-11 ENCOUNTER — Ambulatory Visit: Payer: Medicare Other | Admitting: Hematology and Oncology

## 2016-07-11 VITALS — BP 108/61 | HR 67 | Ht 65.0 in | Wt 202.0 lb

## 2016-07-11 DIAGNOSIS — I951 Orthostatic hypotension: Secondary | ICD-10-CM

## 2016-07-11 DIAGNOSIS — Z510 Encounter for antineoplastic radiation therapy: Secondary | ICD-10-CM | POA: Diagnosis not present

## 2016-07-11 DIAGNOSIS — R55 Syncope and collapse: Secondary | ICD-10-CM

## 2016-07-11 NOTE — Progress Notes (Signed)
HPI Hannah [name] returns today for evaluation of syncope. She is a pleasant 71 yo woman with an extensive cardiac history notable for prior CABG and AVR. She was hospitalized after standing up and passing out with some seizure like activity. She was evaluated extensively and thought to have orthostasis. She was placed on midodrine and her lasix was changed to as needed only. She has not been dizzy since then. She denies chest pain or sob. She is pending more XRT for breast CA. Allergies  Allergen Reactions  . Amoxicillin Rash    Has patient had a PCN reaction causing immediate rash, facial/tongue/throat swelling, SOB or lightheadedness with hypotension: No Has patient had a PCN reaction causing severe rash involving mucus membranes or skin necrosis: Yes Has patient had a PCN reaction that required hospitalization No Has patient had a PCN reaction occurring within the last 10 years: No If all of the above answers are "NO", then may proceed with Cephalosporin use.   . Imdur [Isosorbide Dinitrate] Other (See Comments)    Headache/severe hypotension/Syncope  . Latex Itching  . Tetracycline Rash     Current Outpatient Prescriptions  Medication Sig Dispense Refill  . albuterol (PROVENTIL HFA;VENTOLIN HFA) 108 (90 Base) MCG/ACT inhaler Inhale 2 puffs into the lungs every 6 (six) hours as needed for wheezing or shortness of breath.    Marland Kitchen alendronate (FOSAMAX) 70 MG tablet Take 70 mg by mouth every Wednesday. Take with a full glass of water on an empty stomach.     Marland Kitchen aspirin EC 81 MG EC tablet Take 1 tablet (81 mg total) by mouth daily.    . budesonide-formoterol (SYMBICORT) 160-4.5 MCG/ACT inhaler INHALE TWO PUFFS INTO LUNGS TWICE DAILY 1 Inhaler 11  . clopidogrel (PLAVIX) 75 MG tablet Take 75 mg by mouth daily.    . furosemide (LASIX) 80 MG tablet Take 0.5 tablets (40 mg total) by mouth daily as needed. 1 tablet in the AM and 1/2 tablet (40 mg) in the early afternoon. 90 tablet 3  .  gabapentin (NEURONTIN) 300 MG capsule Take 1 capsule (300 mg total) by mouth 3 (three) times daily. 90 capsule 3  . Insulin Disposable Pump (V-GO 40) KIT USE 1 PER DAY 90 kit 1  . insulin lispro (HUMALOG KWIKPEN) 100 UNIT/ML KiwkPen INJECT 10 UNITS INTO THE SKIN WITH MEALS PLUS SLIDING SCALE (Patient taking differently: Inject 10-12 Units into the skin daily as needed. per SLIDING SCALE) 15 mL 2  . insulin lispro (HUMALOG) 100 UNIT/ML injection Inject 76 units daily with  V-GO pump 70 mL 2  . ipratropium-albuterol (DUONEB) 0.5-2.5 (3) MG/3ML SOLN Take 3 mLs by nebulization 4 (four) times daily. 360 mL 0  . lidocaine-prilocaine (EMLA) cream Apply to affected area once (Patient taking differently: Apply 1 application topically daily as needed (port access). Apply to affected area once) 30 g 3  . loratadine (CLARITIN) 10 MG tablet Take 10 mg by mouth daily.    . midodrine (PROAMATINE) 5 MG tablet Take 1 tablet (5 mg total) by mouth 3 (three) times daily with meals. 90 tablet 1  . potassium chloride SA (KLOR-CON M20) 20 MEQ tablet Take 1 tablet (20 mEq total) by mouth daily as needed (when you take lasix). 60 tablet 3  . rosuvastatin (CRESTOR) 40 MG tablet TAKE ONE TABLET BY MOUTH ONCE DAILY (Patient taking differently: TAKE 54m TABLET BY MOUTH ONCE DAILY) 30 tablet 11  . sotalol (BETAPACE) 120 MG tablet Take 1 tablet (120 mg  total) by mouth every 12 (twelve) hours. 60 tablet 1  . TRULICITY 0.93 OI/7.1IW SOPN Inject 0.75 mg into the skin every Wednesday.     Marland Kitchen ZETIA 10 MG tablet Take 1 tablet by mouth  daily (Patient taking differently: Take 28m by mouth once daily) 90 tablet 2   No current facility-administered medications for this visit.      Past Medical History:  Diagnosis Date  . Abnormally small mouth   . Anemia   . Bilateral carotid artery stenosis    Bilateral ICA 40-59%/  >50% LECA  . Breast [place] ([place]   . Breast cancer of upper-inner quadrant of right female breast ([place] 12/16/2015    . CAD (coronary artery disease) cardiologist-  dr mAundra Dubin  Remote CABG in 1998 with redo in 2009  . CHF (congestive heart failure) (HCC)    EF is low normal at 50 to 55% per echo in Jan. 2011  . GERD (gastroesophageal reflux disease)   . Hearing loss of right ear   . History of non-ST elevation myocardial infarction (NSTEMI)    Sept 2014--  thought to be type II HTN w/ LHC without infarct related artery and patent grafts  . Hyperlipidemia   . Hypertension    Resolved.  . Iron deficiency anemia   . Moderate persistent asthma    pulmologist-  Dr. BMalvin Johns . Nonischemic cardiomyopathy ([facility] [place]   . PAF (paroxysmal atrial fibrillation) ([place]   . Psoriasis    right leg  . Renal calculus, right   . Renal insufficiency, mild   . S/P AVR    prosthesis valve placement 2009 at same time re-do CABG  . Stroke ([facility]    residual rt hearing loss  . Type 2 diabetes mellitus ([place]    monitored by dr kDwyane Dee   ROS:   All systems reviewed and negative except as noted in the HPI.   Past Surgical History:  Procedure Laterality Date  . AORTIC VALVE REPLACEMENT  2009   [facility]done same time is CABG)  . BREAST LUMPECTOMY WITH RADIOACTIVE SEED AND SENTINEL LYMPH NODE BIOPSY Right 01/18/2016   Procedure: RIGHT BREAST LUMPECTOMY WITH RADIOACTIVE SEED AND SENTINEL LYMPH NODE BIOPSY;  Surgeon: DaAlphonsa OverallMD;  [facility] [facility] Service: General;  Laterality: Right;  . CARDIAC CATHETERIZATION  03/23/2008   Pre-redo CABG: L main OK, LAD (T), CFX (T), OM1 99%, RCA (T), LIMA-LAD OK, SVG-OM(?3) OK w/ little florw to OM2, SVG-RCA OK. EF NL  . CARPAL TUNNEL RELEASE    . COLONOSCOPY    . CORONARY ARTERY BYPASS GRAFT  1998 &  re-do 2009   Had LIMA to DX/LAD, SV[facility] 2 marginal [facility]; SVG to 3rd OM and PD at time of redo  . CYSTOSCOPY W/ URETERAL STENT PLACEMENT Right 12/20/2014   Procedure: CYSTOSCOPY WITH RETROGRADE PYELOGRAM/URETERAL STENT  PLACEMENT;  Surgeon: PaCleon GustinMD;  [facility] Service: Urology;  Laterality: Right;  . EYE SURGERY Bilateral    cataracts  . HOLMIUM LASER APPLICATION Right 12/14/78/9983 Procedure:  HOLMIUM LASER LITHOTRIPSY;  Surgeon: PaCleon GustinMD;  [facility] Service: Urology;  Laterality: Right;  . LEFT HEART CATHETERIZATION WITH CORONARY/GRAFT ANGIOGRAM N/A 02/23/2013   Procedure: LEFT HEART CATHETERIZATION WITH COBeatrix Fetters Surgeon: MiBlane OharaMD;  [facility];  Service: Cardiovascular;  Laterality: N/A;  . PORTACATH  PLACEMENT Left 01/18/2016   Procedure: INSERTION PORT-A-CATH;  Surgeon: Alphonsa Overall, MD;  Location: [place];  Service: General;  Laterality: Left;  . TONSILLECTOMY    . TRANSTHORACIC ECHOCARDIOGRAM  02-24-2013      mild LVH,  ef 50-55%/  AV bioprosthesis was present with very mild stenosis and no regurg., mean grandient 26mHg, peak grandient 227mg /  mild MR/  mild LAE and RAE/  moderate TR  . TUBAL LIGATION       Family History  Problem Relation Age of Onset  . Heart disease Father   . Heart failure Father   . Diabetes Maternal Grandmother   . Heart disease Maternal Grandmother   . Diabetes Son   . Healthy Brother     #1  . Heart attack Brother     #2  . Heart disease Brother     #2     Social History   Social History  . Marital status: Married    Spouse name: N/A  . Number of children: 2  . Years of education: N/A   Occupational History  . retired    Social History Main Topics  . Smoking status: Never Smoker  . Smokeless tobacco: Never Used  . Alcohol use No  . Drug use: No  . Sexual activity: Yes    Birth control/ protection: None   Other Topics Concern  . Not on file   Social History Narrative  . No narrative on file     BP 108/61   Pulse 67   Ht 5' 5" (1.651 m)   Wt 202 lb (91.6 kg)   BMI 33.61 kg/m   Physical Exam:  stable appearing [address]10o  woman, NAD HEENT: Unremarkable Neck:  6 cm JVD, no thyromegally Lymphatics:  No adenopathy Back:  No CVA tenderness Lungs:  Clear with no wheezes,rales, or rhonchi HEART:  Regular rate rhythm, no murmurs, no rubs, no clicks Abd:  soft, positive bowel sounds, no organomegally, no rebound, no guarding Ext:  2 plus pulses, no edema, no cyanosis, no clubbing Skin:  No rashes no nodules Neuro:  CN II through XII intact, motor grossly intact  Orthostatic vitals - pulse Blood pressure Position    58 136/92   Lying    57 119/58   Sitting    62 94/56   Standing    64 105/57   Standing 3 min  Assess/Plan: 1. orthostasis - she still has orthostatic hypotension but denies symptoms. I have asked her to continue her midodrine, hold lasix unless swelling is severe, and to get up slowly and consider tight stockings 2. Syncope - this was due to #1. As this occurred while standing, I do not feel strongly that she not be allowed to drive. 3. Atrial arrhythmias - she seems to be asymptomatic at this point. She will continue sotalol. 4. CAD -she denies anginal symptoms. Will follow.  GrMikle Bosworth.

## 2016-07-11 NOTE — Patient Instructions (Addendum)
Medication Instructions:  Your physician recommends that you continue on your current medications as directed. Please refer to the Current Medication list given to you today.   Labwork: None Ordered    Testing/Procedures: None Ordered   Follow-Up: Your physician wants you to follow-up in: 3 months with Truitt Merle, NP and 6 months with Dr. Lovena Le. You will receive a reminder letter in the mail two months in advance. If you don't receive a letter, please call our office to schedule the follow-up appointment.     Any Other Special Instructions Will Be Listed Below (If Applicable).     If you need a refill on your cardiac medications before your next appointment, please call your pharmacy.

## 2016-07-12 ENCOUNTER — Other Ambulatory Visit: Payer: Self-pay

## 2016-07-12 ENCOUNTER — Ambulatory Visit
Admission: RE | Admit: 2016-07-12 | Discharge: 2016-07-12 | Disposition: A | Discharge status: Home / Self Care | Payer: Medicare Other | Source: Ambulatory Visit | Attending: Radiation Oncology | Admitting: Radiation Oncology

## 2016-07-12 ENCOUNTER — Ambulatory Visit: Payer: Medicare Other

## 2016-07-12 ENCOUNTER — Encounter: Payer: Self-pay | Admitting: Endocrinology

## 2016-07-12 ENCOUNTER — Ambulatory Visit (INDEPENDENT_AMBULATORY_CARE_PROVIDER_SITE_OTHER): Payer: Medicare Other | Admitting: Endocrinology

## 2016-07-12 VITALS — BP 124/60 | HR 78 | Ht 65.0 in | Wt 203.0 lb

## 2016-07-12 DIAGNOSIS — Z794 Long term (current) use of insulin: Secondary | ICD-10-CM

## 2016-07-12 DIAGNOSIS — Z510 Encounter for antineoplastic radiation therapy: Secondary | ICD-10-CM | POA: Diagnosis not present

## 2016-07-12 DIAGNOSIS — E1165 Type 2 diabetes mellitus with hyperglycemia: Secondary | ICD-10-CM

## 2016-07-12 DIAGNOSIS — E782 Mixed hyperlipidemia: Secondary | ICD-10-CM

## 2016-07-12 MED ORDER — GLUCOSE BLOOD VI STRP: ORAL_STRIP | 5 refills | Status: DC

## 2016-07-12 NOTE — Patient Instructions (Signed)
Check blood sugars on waking up  3x weekly  Also check blood sugars about 2-3 hours after a meal and do this after different meals by rotation  Recommended blood sugar levels on waking up is 90-130 and about 2 hours after meal is 130-180  Please bring your blood sugar monitor to each visit, thank you

## 2016-07-12 NOTE — Progress Notes (Signed)
Patient ID: Hannah [name], female   DOB: [birth year], 71 y.o.   MRN: [id number]           Reason for Appointment: Follow-up for Type 2 Diabetes  Referring physician: Suzy Bouchard  History of Present Illness:          Date of diagnosis of type 2 diabetes mellitus: Late 1980s        Background history:  She was previously treated with metformin prior to starting insulin She thinks she has been on insulin for at least 10 years and mostly taking Lantus and other types of insulin with it Her metformin was stopped when she started insulin and also has had renal dysfunction Previously her A1c had been consistently over 8% and occasionally higher.  Recent history:   INSULIN regimen is described as:  V-go 40 basal, mealtime coverage 8 u ac B and 8-12 acL, 12-14 acS  She has been on the V-go pump since 03/2015  Most recent A1c is down to 6.4, no recent labs available  Current blood sugar patterns and problems identified:   She was told to change her pump to 30 units because of low readings in the mornings, she thinks her blood sugars are relatively higher at night but had less low sugars in the morning  However she did not apparently get this prescribed and has continued to use the 40 units am  She has had less hypoglycemia in the mornings and only one blood sugar below 65  However blood sugars later in the day are quite variable  She thinks that she may run out of insulin for boluses after her evening meal, she is changing her pump at bedtime.  She is not adjusting her BOLUSES based on what she is eating much in today with eating out at breakfast she had a high reading = 215  Overall checking very readings after breakfast and occasionally after lunch  Fasting readings have been fairly consistent and near normal  Oral hypoglycemic drugs the patient is taking are: None On Trulicity 2.54 mg weekly  Side effects from medications have been: None  Compliance with the medical regimen:  Fair  Glucose monitoring:  done about 3 times a day         Glucometer:  Accu-Chek [employer] Blood Glucose readings by download for the last 4 weeks:  Mean values apply above for all meters except median for One Touch  PRE-MEAL Fasting Lunch Dinner Bedtime Overall  Glucose range: 54-162   58-242  106-327    Mean/median: 118    197  150    POST-MEAL PC Breakfast PC Lunch PC Dinner  Glucose range:  88-214    Mean/median:        Mean values apply above for all meters except median for One Touch  PRE-MEAL Fasting Lunch Dinner Bedtime Overall  Glucose range: 57-206    66-401    Mean/median: 115    200  143     Self-care: The diet that the patient has been following is: tries to limit fats and carbs.  Eating out periodically She uses diet green tea and no sugar in drinks   Lunch 1-2 pm Dinner 6-7 PM; Hs snack   Typical meal intake: Breakfast is eggs usually             Dietician visit, most recent: 5/16 And in 7/16 with oncology dietitian                Exercise:  none  recently  Weight history:  Wt Readings from Last 3 Encounters:  07/12/16 203 lb (92.1 kg)  07/11/16 202 lb (91.6 kg)  07/10/16 201 lb 12.8 oz (91.5 kg)    Glycemic control:      Lab Results  Component Value Date   HGBA1C 6.4 05/08/2016   HGBA1C 6.5 01/05/2016   HGBA1C 7.8 (H) 09/19/2015   Lab Results  Component Value Date   MICROALBUR 24.3 (H) 09/19/2015   LDLCALC 82 07/10/2016   CREATININE 1.17 07/10/2016       Allergies as of 07/12/2016      Reactions   Amoxicillin Rash   Has patient had a PCN reaction causing immediate rash, facial/tongue/throat swelling, SOB or lightheadedness with hypotension: No Has patient had a PCN reaction causing severe rash involving mucus membranes or skin necrosis: Yes Has patient had a PCN reaction that required hospitalization No Has patient had a PCN reaction occurring within the last 10 years: No If all of the above answers are "NO", then may proceed with  Cephalosporin use.   Imdur [isosorbide Dinitrate] Other (See Comments)   Headache/severe hypotension/Syncope   Latex Itching   Tetracycline Rash      Medication List       Accurate as of 07/12/16 11:59 PM. Always use your most recent med list.          albuterol 108 (90 Base) MCG/ACT inhaler Commonly known as:  PROVENTIL HFA;VENTOLIN HFA Inhale 2 puffs into the lungs every 6 (six) hours as needed for wheezing or shortness of breath.   alendronate 70 MG tablet Commonly known as:  FOSAMAX Take 70 mg by mouth every Wednesday. Take with a full glass of water on an empty stomach.   aspirin 81 MG EC tablet Take 1 tablet (81 mg total) by mouth daily.   budesonide-formoterol 160-4.5 MCG/ACT inhaler Commonly known as:  SYMBICORT INHALE TWO PUFFS INTO LUNGS TWICE DAILY   clopidogrel 75 MG tablet Commonly known as:  PLAVIX Take 75 mg by mouth daily.   furosemide 80 MG tablet Commonly known as:  LASIX Take 0.5 tablets (40 mg total) by mouth daily as needed. 1 tablet in the AM and 1/2 tablet (40 mg) in the early afternoon.   gabapentin 300 MG capsule Commonly known as:  NEURONTIN Take 1 capsule (300 mg total) by mouth 3 (three) times daily.   glucose blood test strip Commonly known as:  ACCU-CHEK GUIDE Use to test blood sugar 3 times daily   insulin lispro 100 UNIT/ML KiwkPen Commonly known as:  HUMALOG KWIKPEN INJECT 10 UNITS INTO THE SKIN WITH MEALS PLUS SLIDING SCALE   insulin lispro 100 UNIT/ML injection Commonly known as:  HUMALOG Inject 76 units daily with  V-GO pump   ipratropium-albuterol 0.5-2.5 (3) MG/3ML Soln Commonly known as:  DUONEB Take 3 mLs by nebulization 4 (four) times daily.   lidocaine-prilocaine cream Commonly known as:  EMLA Apply to affected area once   loratadine 10 MG tablet Commonly known as:  CLARITIN Take 10 mg by mouth daily.   midodrine 5 MG tablet Commonly known as:  PROAMATINE Take 1 tablet (5 mg total) by mouth 3 (three) times  daily with meals.   potassium chloride SA 20 MEQ tablet Commonly known as:  KLOR-CON M20 Take 1 tablet (20 mEq total) by mouth daily as needed (when you take lasix).   rosuvastatin 40 MG tablet Commonly known as:  CRESTOR TAKE ONE TABLET BY MOUTH ONCE DAILY   sotalol 120 MG tablet  Commonly known as:  BETAPACE Take 1 tablet (120 mg total) by mouth every 12 (twelve) hours.   TRULICITY 6.65 LD/3.5TS Sopn Generic drug:  Dulaglutide Inject 0.75 mg into the skin every Wednesday.   V-GO 40 Kit USE 1 PER DAY   ZETIA 10 MG tablet Generic drug:  ezetimibe Take 1 tablet by mouth  daily       Allergies:  Allergies  Allergen Reactions  . Amoxicillin Rash    Has patient had a PCN reaction causing immediate rash, facial/tongue/throat swelling, SOB or lightheadedness with hypotension: No Has patient had a PCN reaction causing severe rash involving mucus membranes or skin necrosis: Yes Has patient had a PCN reaction that required hospitalization No Has patient had a PCN reaction occurring within the last 10 years: No If all of the above answers are "NO", then may proceed with Cephalosporin use.   . Imdur [Isosorbide Dinitrate] Other (See Comments)    Headache/severe hypotension/Syncope  . Latex Itching  . Tetracycline Rash    Past Medical History:  Diagnosis Date  . Abnormally small mouth   . Anemia   . Bilateral carotid artery stenosis    Bilateral ICA 40-59%/  >50% LECA  . Breast cancer ([place])   . Breast cancer of upper-inner quadrant of right female breast ([place]) 12/16/2015  . CAD (coronary artery disease) cardiologist-  dr Aundra Dubin   Remote CABG in 1998 with redo in 2009  . CHF (congestive heart failure) (H[facility])    EF is low normal at 50 to 55% per echo in Jan. 2011  . GERD (gastroesophageal reflux disease)   . Hearing loss of right ear   . History of non-ST elevation myocardial infarction (NSTEMI)    Sept 2014--  thought to be type II HTN w/ LHC without infarct related artery  and patent grafts  . Hyperlipidemia   . Hypertension    Resolved.  . Iron deficiency anemia   . Moderate persistent asthma    pulmologist-  Dr. Malvin Johns  . Nonischemic cardiomyopathy ([place])   . PAF (paroxysmal atrial fibrillation) ([place])   . Psoriasis    right leg  . Renal calculus, right   . Renal insufficiency, mild   . S/P AVR    prosthesis valve placement 2009 at same time re-do CABG  . Stroke [facility])    residual rt hearing loss  . Type 2 diabetes mellitus ([place])    monitored by dr Dwyane Dee    Past Surgical History:  Procedure Laterality Date  . AORTIC VALVE REPLACEMENT  2009   [facility]done same time is [facility]G)  . BREAST LUMPECTOMY WITH RADIOACTIVE SEED AND SENTINEL LYMPH NODE BIOPSY Right 01/18/2016   Procedure: RIGHT BREAST LUMPECTOMY WITH RADIOACTIVE SEED AND SENTINEL LYMPH NODE BIOPSY;  Surgeon: DAlphonsa Overall MD;  [place]  Service: General;  Laterality: Right;  . CARDIAC CATHETERIZATION  03/23/2008   Pre-redo CABG: L main OK, LAD (T), CFX (T), OM1 99%, RCA (T), LIMA-LAD OK, SVG-OM(?3) OK w/ little florw to OM2, SVG-RCA OK. EF NL  . CARPAL TUNNEL RELEASE    . COLONOSCOPY    . CORONARY ARTERY BYPASS GRAFT  1998 &  re-do 2009   Had LIMA to DX/LAD, SVG to 2 marginal branches and [facility]; SVG to 3rd OM and PD at time of redo  . CYSTOSCOPY W/ URETERAL STENT PLACEMENT Right 12/20/2014   Procedure: CYSTOSCOPY WITH RETROGRADE PYELOGRAM/URETERAL STENT PLACEMENT;  Surgeon: PCleon Gustin MD;  Location: [place];  Service: Urology;  Laterality: Right;  . EYE SURGERY Bilateral    cataracts  . HOLMIUM LASER APPLICATION Right 3/89/3734   Procedure:  HOLMIUM LASER LITHOTRIPSY;  Surgeon: Cleon Gustin, MD;  Location: [facility];  Service: Urology;  Laterality: Right;  . LEFT HEART CATHETERIZATION WITH CORONARY/GRAFT ANGIOGRAM N/A 02/23/2013   Procedure: LEFT HEART CATHETERIZATION WITH Beatrix Fetters;  Surgeon: Blane Ohara, MD;  [facility];  Service: Cardiovascular;  Laterality: N/A;  . PORTACATH PLACEMENT Left 01/18/2016   Procedure: INSERTION PORT-A-CATH;  Surgeon: Alphonsa Overall, MD;  Location: [place];  Service: General;  Laterality: Left;  . TONSILLECTOMY    . TRANSTHORACIC ECHOCARDIOGRAM  02-24-2013      mild LVH,  ef 50-55%/  AV bioprosthesis was present with very mild stenosis and no regurg., mean grandient 16mHg, peak grandient 22mg /  mild MR/  mild LAE and RAE/  moderate TR  . TUBAL LIGATION      Family History  Problem Relation Age of Onset  . Heart disease Father   . Heart failure Father   . Diabetes Maternal Grandmother   . Heart disease Maternal Grandmother   . Diabetes Son   . Healthy Brother     #1  . Heart attack Brother     #2  . Heart disease Brother     #2    Social History:  reports that she has never smoked. She has never used smokeless tobacco. She reports that she does not drink alcohol or use drugs.    Review of Systems    Lipid history: On 40 mg Crestor along with Zetia  LDL over 70, followed by cardiologist    Lab Results  Component Value Date   CHOL 137 07/10/2016   HDL 33.70 (L) 07/10/2016   LDLCALC 82 07/10/2016   LDLDIRECT 109.2 03/01/2014   TRIG 107.0 07/10/2016   CHOLHDL 4 07/10/2016           Eyes:   Most recent eye exam was In 2017 and was not told to have any retinopathy  Hypertension: Blood pressure is well controlled  Only on metoprolol  She is having more edema, followed by cardiologist  Diabetic foot exam in 5/16: She has complete loss of monofilament sensation in her distal feet with only mild sensitivity present on the right first and second toes Also followed by her PCP   Physical Examination:  BP 124/60   Pulse 78   Ht 5' 5"  (1.651 m)   Wt 203 lb (92.1 kg)   SpO2 95%   BMI 33.78 kg/m    ASSESSMENT:  Diabetes type 2, uncontrolled   See history of present illness for detailed  discussion of  current management, blood sugar patterns and problems identified Her A1c has not been checked recently Although previously was tending to have early morning hypoglycemia with the 40 unit basal this has been much less frequent now However she did miss a dose of Trulicity this month Has very well postprandial readings based on what she is eating and may not always get enough coverage of meals Also not adjusting the dose is enough for larger carbohydrate meals or larger meals especially eating out Currently has not done any Insulin injections with this range for additional insulin  However has had some difficulties with intercurrent illnesses and last month getting steroid dose making her control difficulties  HYPERLIPIDEMIA: Her LDL is 82, already on a 2 drug  regimen, will defer to cardiology, target 70  PLAN:   She continued to 40 units am.  She will need to adjust her boluses based on what she is eating  More consistent low fat meals  If she needs an extra dose of insulin and does not have enough in the home for the boluses she can take an injection  Continue Trulicity and try to be consistent, she had missed one dose this month  She needs to check more readings after meals and can do morning readings every other day  She will continue to get the help of her daughter to help her manage her diabetes  Check A1c on the next visit  Accu-Chek guide meter given  Patient Instructions  Check blood sugars on waking up  3x weekly  Also check blood sugars about 2-3 hours after a meal and do this after different meals by rotation  Recommended blood sugar levels on waking up is 90-130 and about 2 hours after meal is 130-180  Please bring your blood sugar monitor to each visit, thank you     Counseling time on subjects discussed above is over 50% of today's 25 minute visit   Arlet Marter 07/13/2016, 8:02 AM   Note: This office note was prepared with Dragon voice  recognition system technology. Any transcriptional errors that result from this process are unintentional.

## 2016-07-13 ENCOUNTER — Ambulatory Visit: Payer: Medicare Other

## 2016-07-13 ENCOUNTER — Ambulatory Visit
Admission: RE | Admit: 2016-07-13 | Discharge: 2016-07-13 | Disposition: A | Discharge status: Home / Self Care | Payer: Medicare Other | Source: Ambulatory Visit | Attending: Radiation Oncology | Admitting: Radiation Oncology

## 2016-07-13 DIAGNOSIS — Z510 Encounter for antineoplastic radiation therapy: Secondary | ICD-10-CM | POA: Diagnosis not present

## 2016-07-16 ENCOUNTER — Ambulatory Visit
Admission: RE | Admit: 2016-07-16 | Discharge: 2016-07-16 | Disposition: A | Discharge status: Home / Self Care | Payer: Medicare Other | Source: Ambulatory Visit | Attending: Radiation Oncology | Admitting: Radiation Oncology

## 2016-07-16 ENCOUNTER — Ambulatory Visit: Payer: Medicare Other

## 2016-07-16 ENCOUNTER — Encounter: Payer: Self-pay | Admitting: *Deleted

## 2016-07-16 ENCOUNTER — Ambulatory Visit: Payer: Medicare Other | Admitting: Nurse Practitioner

## 2016-07-16 DIAGNOSIS — Z510 Encounter for antineoplastic radiation therapy: Secondary | ICD-10-CM | POA: Diagnosis not present

## 2016-07-16 NOTE — Progress Notes (Signed)
We have made attempts to reach you by phone unsuccessfully; Letter mailed/SLS 02/05

## 2016-07-17 ENCOUNTER — Encounter: Payer: Self-pay | Admitting: Radiation Oncology

## 2016-07-17 ENCOUNTER — Ambulatory Visit
Admission: RE | Admit: 2016-07-17 | Discharge: 2016-07-17 | Disposition: A | Discharge status: Home / Self Care | Payer: Medicare Other | Source: Ambulatory Visit | Attending: Radiation Oncology | Admitting: Radiation Oncology

## 2016-07-17 ENCOUNTER — Ambulatory Visit: Payer: Medicare Other

## 2016-07-17 VITALS — BP 139/52 | HR 76 | Temp 98.0°F | Ht 65.0 in | Wt 202.8 lb

## 2016-07-17 DIAGNOSIS — Z510 Encounter for antineoplastic radiation therapy: Secondary | ICD-10-CM | POA: Diagnosis not present

## 2016-07-17 DIAGNOSIS — C50211 Malignant neoplasm of upper-inner quadrant of right female breast: Secondary | ICD-10-CM

## 2016-07-17 NOTE — Progress Notes (Signed)
Hannah [name] presents for her 26th fraction of radiation to her Right Breast. She denies pain,except soreness to her Right Breast. She does have a large round/hard area to her incision area which bothers her at times. The area underneath her Right Breast has healed well. Her Right Breast is slightly red, and she is using the Radiaplex twice daily.   BP (!) 139/52   Pulse 76   Temp 98 F (36.7 C)   Ht 5\' 5"  (1.651 m)   Wt 202 lb 12.8 oz (92 kg)   SpO2 100% Comment: room air  BMI 33.75 kg/m    Wt Readings from Last 3 Encounters:  07/17/16 202 lb 12.8 oz (92 kg)  07/12/16 203 lb (92.1 kg)  07/11/16 202 lb (91.6 kg)

## 2016-07-17 NOTE — Progress Notes (Signed)
  Radiation Oncology         (336) [phone] ________________________________  Name: Hannah [name] MRN: [id number]  Date: 07/17/2016  DOB: [birth year]   Weekly Radiation Therapy Management    ICD-9-CM ICD-10-CM   1. Malignant neoplasm of upper-inner quadrant of right female breast, unspecified estrogen receptor status ([place]) 174.2 C50.211      Current Dose: 46.8 Gy     Planned Dose:  60.4 Gy  Narrative . . . . . . . . The patient presents for routine under treatment assessment.                                   [name] presents for her 26th fraction of radiation to her Right Breast. She denies pain, except for soreness to her Right Breast. She does have a large round/hard area to her incision area which bothers her at times. The nurse notes her Right Breast is slightly red and she is using Radiaplex twice daily.                                  Set-up films were reviewed.                                 The chart was checked. Physical Findings. . .  height is 5\' 5"  (1.651 m) and weight is 202 lb 12.8 oz (92 kg). Her temperature is 98 F (36.7 C). Her blood pressure is 139/52 (abnormal) and her pulse is 76. Her oxygen saturation is 100%.  Weight essentially stable. Lungs are clear to auscultation bilaterally. Heart has regular rate and rhythm. Erythema and hyperpigmentation changes of the right breast, no skin breakdown. Right axillary seroma appears more prominent, but no sign of infection. Impression . . . . . . . The patient is tolerating radiation. Plan . . . . . . . . . . . . Continue treatment as planned. ________________________________   Blair Promise, PhD, MD  This document serves as a record of services personally performed by Gery Pray, MD. It was created on his behalf by Darcus Austin, a trained medical scribe. The creation of this record is based on the scribe's personal observations and the provider's statements to them. This document has been checked and approved by the  attending provider.

## 2016-07-18 ENCOUNTER — Ambulatory Visit: Payer: Medicare Other

## 2016-07-18 ENCOUNTER — Ambulatory Visit
Admission: RE | Admit: 2016-07-18 | Discharge: 2016-07-18 | Disposition: A | Discharge status: Home / Self Care | Payer: Medicare Other | Source: Ambulatory Visit | Attending: Radiation Oncology | Admitting: Radiation Oncology

## 2016-07-18 DIAGNOSIS — Z510 Encounter for antineoplastic radiation therapy: Secondary | ICD-10-CM | POA: Diagnosis not present

## 2016-07-19 ENCOUNTER — Ambulatory Visit: Payer: Medicare Other

## 2016-07-19 ENCOUNTER — Ambulatory Visit
Admission: RE | Admit: 2016-07-19 | Discharge: 2016-07-19 | Disposition: A | Discharge status: Home / Self Care | Payer: Medicare Other | Source: Ambulatory Visit | Attending: Radiation Oncology | Admitting: Radiation Oncology

## 2016-07-19 DIAGNOSIS — Z510 Encounter for antineoplastic radiation therapy: Secondary | ICD-10-CM | POA: Diagnosis not present

## 2016-07-20 ENCOUNTER — Ambulatory Visit: Payer: Medicare Other

## 2016-07-20 ENCOUNTER — Ambulatory Visit
Admission: RE | Admit: 2016-07-20 | Discharge: 2016-07-20 | Disposition: A | Discharge status: Home / Self Care | Payer: Medicare Other | Source: Ambulatory Visit | Attending: Radiation Oncology | Admitting: Radiation Oncology

## 2016-07-20 DIAGNOSIS — Z510 Encounter for antineoplastic radiation therapy: Secondary | ICD-10-CM | POA: Diagnosis not present

## 2016-07-20 DIAGNOSIS — C50211 Malignant neoplasm of upper-inner quadrant of right female breast: Secondary | ICD-10-CM

## 2016-07-20 MED ORDER — RADIAPLEXRX EX GEL
Freq: Once | CUTANEOUS | Status: AC
  Administered 2016-07-20: 14:00:00 via TOPICAL

## 2016-07-23 ENCOUNTER — Ambulatory Visit: Payer: Medicare Other

## 2016-07-23 ENCOUNTER — Ambulatory Visit
Admission: RE | Admit: 2016-07-23 | Discharge: 2016-07-23 | Disposition: A | Discharge status: Home / Self Care | Payer: Medicare Other | Source: Ambulatory Visit | Attending: Radiation Oncology | Admitting: Radiation Oncology

## 2016-07-23 DIAGNOSIS — Z510 Encounter for antineoplastic radiation therapy: Secondary | ICD-10-CM | POA: Diagnosis not present

## 2016-07-24 ENCOUNTER — Encounter: Payer: Self-pay | Admitting: Radiation Oncology

## 2016-07-24 ENCOUNTER — Ambulatory Visit
Admission: RE | Admit: 2016-07-24 | Discharge: 2016-07-24 | Disposition: A | Discharge status: Home / Self Care | Payer: Medicare Other | Source: Ambulatory Visit | Attending: Radiation Oncology | Admitting: Radiation Oncology

## 2016-07-24 ENCOUNTER — Ambulatory Visit: Payer: Medicare Other

## 2016-07-24 VITALS — BP 135/56 | HR 97 | Temp 97.7°F | Resp 20 | Wt 203.2 lb

## 2016-07-24 DIAGNOSIS — C50211 Malignant neoplasm of upper-inner quadrant of right female breast: Secondary | ICD-10-CM

## 2016-07-24 DIAGNOSIS — Z510 Encounter for antineoplastic radiation therapy: Secondary | ICD-10-CM | POA: Diagnosis not present

## 2016-07-24 NOTE — Progress Notes (Signed)
  Radiation Oncology         (336) [phone] ________________________________  Name: Hannah [name] MRN: [id number]  Date: 07/24/2016  DOB: Oct 31, 1945   Weekly Radiation Therapy Management    ICD-9-CM ICD-10-CM   1. Malignant neoplasm of upper-inner quadrant of right female breast, unspecified estrogen receptor status ([facility]) 174.2 C50.211      Current Dose: 56.4 Gy     Planned Dose:  60.4 Gy  Narrative . . . . . . . . The patient presents for routine under treatment assessment.                                   Weekly rad txs right breast, 31/33 completed. The nurse notes mild erythema of the right breast, a small blister in the inframammary fold, and skin that looks like it is about to peel. She is using radaiplex BID. The nurse gave gave a f/u appointment card and and a flyer on the [facility] support group. She is interested in starting the next session in April 2018.                                  Set-up films were reviewed.                                 The chart was checked. Physical Findings. . .  weight is 203 lb 3.2 oz (92.2 kg). Her oral temperature is 97.7 F (36.5 C). Her blood pressure is 135/56 (abnormal) and her pulse is 97. Her respiration is 20.  Weight essentially stable. Lungs are clear to auscultation bilaterally. Heart has regular rate and rhythm. Small area of skin breakdown the medial right inframammary fold with no sign of infection. Remainder of the breast shows mild erythema and mild hyperpigmentation changes. Impression . . . . . . . The patient is tolerating radiation. Plan . . . . . . . . . . . . Continue treatment as planned. The patient will follow up on 08/30/16 and will follow up with Dr. Lindi Adie on 07/26/16. ________________________________   Blair Promise, PhD, MD  This document serves as a record of services personally performed by Gery Pray, MD. It was created on his behalf by Darcus Austin, a trained medical scribe. The creation of this record is based on  the scribe's personal observations and the provider's statements to them. This document has been checked and approved by the attending provider.

## 2016-07-24 NOTE — Progress Notes (Signed)
Weekly rad txs right breast, 31/33 completed,  mild erythema,  Small blister under inframmary fold, looks like skin about to peel, using radaiplex bid, got a new tube last week, gave f/u appt, and flyer on [facility] support group, she is interested starting next session in opril 2018 2:30 PM BP (!) 135/56 (BP Location: Left Arm, Patient Position: Sitting, Cuff Size: Normal)   Pulse 97   Temp 97.7 F (36.5 C) (Oral)   Resp 20   Wt 203 lb 3.2 oz (92.2 kg)   BMI 33.81 kg/m   Wt Readings from Last 3 Encounters:  07/24/16 203 lb 3.2 oz (92.2 kg)  07/17/16 202 lb 12.8 oz (92 kg)  07/12/16 203 lb (92.1 kg)

## 2016-07-25 ENCOUNTER — Ambulatory Visit
Admission: RE | Admit: 2016-07-25 | Discharge: 2016-07-25 | Disposition: A | Discharge status: Home / Self Care | Payer: Medicare Other | Source: Ambulatory Visit | Attending: Radiation Oncology | Admitting: Radiation Oncology

## 2016-07-25 DIAGNOSIS — Z510 Encounter for antineoplastic radiation therapy: Secondary | ICD-10-CM | POA: Diagnosis not present

## 2016-07-25 NOTE — Assessment & Plan Note (Signed)
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 Adjuvant chemotherapy with Taxotere and Cytoxan (3 cycles). Adriamycin is not being given because of her cardiac problems, Could receive 3 cycles of TC 02/17/16 to 04/06/16 2. Followed by adjuvant radiation 05/24/16 to 07/26/16 3 followed by adjuvant antiestrogen therapy (because a satellite tumor is weakly ER and PR positive). ---------------------------------------------------------------------------------- Plan: Anti estrogen therapy

## 2016-07-26 ENCOUNTER — Encounter: Payer: Self-pay | Admitting: Hematology and Oncology

## 2016-07-26 ENCOUNTER — Ambulatory Visit (HOSPITAL_BASED_OUTPATIENT_CLINIC_OR_DEPARTMENT_OTHER): Payer: Medicare Other | Admitting: Hematology and Oncology

## 2016-07-26 ENCOUNTER — Ambulatory Visit
Admission: RE | Admit: 2016-07-26 | Discharge: 2016-07-26 | Disposition: A | Discharge status: Home / Self Care | Payer: Medicare Other | Source: Ambulatory Visit | Attending: Radiation Oncology | Admitting: Radiation Oncology

## 2016-07-26 ENCOUNTER — Encounter: Payer: Self-pay | Admitting: Radiation Oncology

## 2016-07-26 ENCOUNTER — Telehealth: Payer: Self-pay | Admitting: *Deleted

## 2016-07-26 DIAGNOSIS — G620 Drug-induced polyneuropathy: Secondary | ICD-10-CM

## 2016-07-26 DIAGNOSIS — E119 Type 2 diabetes mellitus without complications: Secondary | ICD-10-CM

## 2016-07-26 DIAGNOSIS — Z170 Estrogen receptor positive status [ER+]: Secondary | ICD-10-CM | POA: Diagnosis not present

## 2016-07-26 DIAGNOSIS — C50211 Malignant neoplasm of upper-inner quadrant of right female breast: Secondary | ICD-10-CM

## 2016-07-26 DIAGNOSIS — Z510 Encounter for antineoplastic radiation therapy: Secondary | ICD-10-CM | POA: Diagnosis not present

## 2016-07-26 DIAGNOSIS — I509 Heart failure, unspecified: Secondary | ICD-10-CM

## 2016-07-26 MED ORDER — GABAPENTIN 300 MG PO CAPS: 300.0000 mg | ORAL_CAPSULE | Freq: Three times a day (TID) | ORAL | 6 refills | Status: DC

## 2016-07-26 NOTE — Progress Notes (Signed)
Patient Care Team: Mackie Pai, PA-C as PCP - General (Internal Medicine) Alphonsa Overall, MD as Consulting Physician (General Surgery) Nicholas Lose, MD as Consulting Physician (Hematology and Oncology) Gery Pray, MD as Consulting Physician (Radiation Oncology) Evans Lance, MD as Consulting Physician (Cardiology) Collene Gobble, MD as Consulting Physician (Pulmonary Disease) Gery Pray, MD as Consulting Physician (Radiation Oncology) Elayne Snare, MD as Consulting Physician (Endocrinology)  DIAGNOSIS:  Encounter Diagnosis  Name Primary?  . Malignant neoplasm of upper-inner quadrant of right breast in female, estrogen receptor positive ([place])     SUMMARY OF ONCOLOGIC HISTORY:   Breast cancer of upper-inner quadrant of right female breast ([place])   12/14/2015 Initial Diagnosis    Right breast biopsy 12:30 position: 2 masses, 1.6 cm mass: Invasive ductal carcinoma, grade 2, ER 0%, PR 0%, HER-2 negative, Ki-67 70%; satellite mass 8 mm: IDC grade 2 ER 5%, PR 5%, HER-2 negative, Ki-67 50%; T1cN0 stage IA clinical stage      01/18/2016 Surgery    Right lumpectomy: Multifocal IDC grade 3, 1.9 cm  ER 0%, PR 0%, HER-2 negative, Ki-67 70% and 0.8 cm satellite mass ER 5%, PR 2%, HER-2 negative, Ki-67 50%, high-grade DCIS, margins negative, 0/1 lymph nodes negative T1 cN0 stage IA      02/17/2016 - 04/06/2016 Anti-estrogen oral therapy    Adjuvant chemotherapy with Taxotere and Cytoxan3 stopped early due to neuropathy, recurrent cellulitis of legs      04/08/2016 - 04/23/2016 Hospital Admission    Hosp adm for cellulitis      05/24/2016 - 07/25/2016 Radiation Therapy     Adj XRT      06/29/2016 - 07/04/2016 Hospital Admission    Seizure like activity, MRI brain and EEG unremarkable        CHIEF COMPLIANT: Follow-up after recent hospitalization for seizure-like activity.  INTERVAL HISTORY: Hannah [name] is a 71 year old lady with above-mentioned history of right breast cancer  treated with lumpectomy and adjuvant radiation. She was in the hospital recently because she has seizure-like activity but the brain MRI and EEG were unremarkable. We stopped her chemotherapy after 3 cycles of Taxotere and Cytoxan because of complications including cellulitis. She has finally completed radiation therapy and is here today to discuss the treatment plan. She does have weak ER and PR positivity and hence would benefit from antiestrogen therapy.  REVIEW OF SYSTEMS:   Constitutional: Denies fevers, chills or abnormal weight loss Eyes: Denies blurriness of vision Ears, nose, mouth, throat, and face: Denies mucositis or sore throat Respiratory: Denies cough, dyspnea or wheezes Cardiovascular: Denies palpitation, chest discomfort Gastrointestinal:  Denies nausea, heartburn or change in bowel habits Skin: Denies abnormal skin rashes Lymphatics: Denies new lymphadenopathy or easy bruising Neurological: Profound neuropathy in the hands and feet Behavioral/Psych: Mood is stable, no new changes  Extremities: No lower extremity edema Breast:  denies any pain or lumps or nodules in either breasts All other systems were reviewed with the patient and are negative.  I have reviewed the past medical history, past surgical history, social history and family history with the patient and they are unchanged from previous note.  ALLERGIES:  is allergic to amoxicillin; imdur [isosorbide dinitrate]; latex; and tetracycline.  MEDICATIONS:  Current Outpatient Prescriptions  Medication Sig Dispense Refill  . albuterol (PROVENTIL HFA;VENTOLIN HFA) 108 (90 Base) MCG/ACT inhaler Inhale 2 puffs into the lungs every 6 (six) hours as needed for wheezing or shortness of breath.    Marland Kitchen alendronate (FOSAMAX) 70 MG tablet  Take 70 mg by mouth every Wednesday. Take with a full glass of water on an empty stomach.     Marland Kitchen aspirin EC 81 MG EC tablet Take 1 tablet (81 mg total) by mouth daily.    . budesonide-formoterol  (SYMBICORT) 160-4.5 MCG/ACT inhaler INHALE TWO PUFFS INTO LUNGS TWICE DAILY 1 Inhaler 11  . clopidogrel (PLAVIX) 75 MG tablet Take 75 mg by mouth daily.    . furosemide (LASIX) 80 MG tablet Take 0.5 tablets (40 mg total) by mouth daily as needed. 1 tablet in the AM and 1/2 tablet (40 mg) in the early afternoon. 90 tablet 3  . gabapentin (NEURONTIN) 300 MG capsule Take 1 capsule (300 mg total) by mouth 3 (three) times daily. 90 capsule 6  . glucose blood (ACCU-CHEK GUIDE) test strip Use to test blood sugar 3 times daily 100 each 5  . Insulin Disposable Pump (V-GO 40) KIT USE 1 PER DAY 90 kit 1  . insulin lispro (HUMALOG KWIKPEN) 100 UNIT/ML KiwkPen INJECT 10 UNITS INTO THE SKIN WITH MEALS PLUS SLIDING SCALE (Patient taking differently: Inject 10-12 Units into the skin daily as needed. per SLIDING SCALE) 15 mL 2  . insulin lispro (HUMALOG) 100 UNIT/ML injection Inject 76 units daily with  V-GO pump 70 mL 2  . ipratropium-albuterol (DUONEB) 0.5-2.5 (3) MG/3ML SOLN Take 3 mLs by nebulization 4 (four) times daily. 360 mL 0  . lidocaine-prilocaine (EMLA) cream Apply to affected area once (Patient taking differently: Apply 1 application topically daily as needed (port access). Apply to affected area once) 30 g 3  . loratadine (CLARITIN) 10 MG tablet Take 10 mg by mouth daily.    . midodrine (PROAMATINE) 5 MG tablet Take 1 tablet (5 mg total) by mouth 3 (three) times daily with meals. 90 tablet 1  . potassium chloride SA (KLOR-CON M20) 20 MEQ tablet Take 1 tablet (20 mEq total) by mouth daily as needed (when you take lasix). 60 tablet 3  . rosuvastatin (CRESTOR) 40 MG tablet TAKE ONE TABLET BY MOUTH ONCE DAILY (Patient taking differently: TAKE 45m TABLET BY MOUTH ONCE DAILY) 30 tablet 11  . sotalol (BETAPACE) 120 MG tablet Take 1 tablet (120 mg total) by mouth every 12 (twelve) hours. 60 tablet 1  . TRULICITY 05.62MZH/0.8MVSOPN Inject 0.75 mg into the skin every Wednesday.     .Marland KitchenZETIA 10 MG tablet Take 1  tablet by mouth  daily (Patient taking differently: Take 112mby mouth once daily) 90 tablet 2   No current facility-administered medications for this visit.     PHYSICAL EXAMINATION: ECOG PERFORMANCE STATUS: 2 - Symptomatic, <50% confined to bed  Vitals:   07/26/16 1152  BP: (!) 120/49  Pulse: (!) 109  Resp: 18  Temp: 98.7 F (37.1 C)   Filed Weights   07/26/16 1152  Weight: 203 lb 12.8 oz (92.4 kg)    GENERAL:alert, no distress and comfortable SKIN: skin color, texture, turgor are normal, no rashes or significant lesions EYES: normal, Conjunctiva are pink and non-injected, sclera clear OROPHARYNX:no exudate, no erythema and lips, buccal mucosa, and tongue normal  NECK: supple, thyroid normal size, non-tender, without nodularity LYMPH:  no palpable lymphadenopathy in the cervical, axillary or inguinal LUNGS: clear to auscultation and percussion with normal breathing effort HEART: regular rate & rhythm and no murmurs and no lower extremity edema ABDOMEN:abdomen soft, non-tender and normal bowel sounds MUSCULOSKELETAL:no cyanosis of digits and no clubbing  NEURO: alert & oriented x 3 with fluent  speech, profound neuropathy in the hands and feet EXTREMITIES: No lower extremity edema  LABORATORY DATA:  I have reviewed the data as listed   Chemistry      Component Value Date/Time   NA 142 07/10/2016 1037   NA 143 06/15/2016 1206   NA 143 04/27/2016 1352   K 4.7 07/10/2016 1037   K 4.1 04/27/2016 1352   CL 108 07/10/2016 1037   CO2 34 (H) 07/10/2016 1037   CO2 29 04/27/2016 1352   BUN 20 07/10/2016 1037   BUN 37 (H) 06/15/2016 1206   BUN 29.5 (H) 04/27/2016 1352   CREATININE 1.17 07/10/2016 1037   CREATININE 1.66 (H) 05/23/2016 1143   CREATININE 1.3 (H) 04/27/2016 1352      Component Value Date/Time   CALCIUM 9.4 07/10/2016 1037   CALCIUM 8.9 04/27/2016 1352   ALKPHOS 53 07/10/2016 1037   ALKPHOS 62 04/27/2016 1352   AST 21 07/10/2016 1037   AST 28 04/27/2016  1352   ALT 19 07/10/2016 1037   ALT 24 04/27/2016 1352   BILITOT 0.6 07/10/2016 1037   BILITOT 0.81 04/27/2016 1352       Lab Results  Component Value Date   WBC 5.2 07/10/2016   HGB 10.4 (L) 07/10/2016   HCT 30.9 (L) 07/10/2016   MCV 92.1 07/10/2016   PLT 129.0 (L) 07/10/2016   NEUTROABS 3.3 07/10/2016    ASSESSMENT & PLAN:  Breast cancer of upper-inner quadrant of right female breast ([place]) Right breast biopsy 12:30 position 12/14/2015: 2 masses, 1.6 cm mass: Invasive ductal carcinoma, grade 2, ER 0%, PR 0%, HER-2 negative, Ki-67 70%; satellite mass 8 mm: IDC grade 2 ER 5%, PR 5%, HER-2 negative, Ki-67 50%; T1cN0 stage IA clinical stage  Right lumpectomy 01/18/2016: Multifocal IDC grade 3, 1.9 cm ER 0%, PR 0%, HER-2 negative, Ki-67 70% and 0.8 cm satellite mass ER 5%, PR 2%, HER-2 negative, Ki-67 50%, high-grade DCIS, margins negative, 0/1 lymph nodes negative T1 cN0 stage IA  DM-2 with CRF: not giving steroids CHF: Cannot give Adriamycin CRF: Will closely monitor Severe peripheral neuropathy: Currently on gabapentin. I renewed her medication today.  Treatment plan: 1. Adjuvant chemotherapy with Taxotere and Cytoxan (3 cycles). Adriamycin is not being given because of her cardiac problems, Could receive 3 cycles of TC 02/17/16 to 04/06/16 2. Followed by adjuvant radiation 05/24/16 to 07/26/16 3 followed by adjuvant antiestrogen therapy (because a satellite tumor is weakly ER and PR positive). ---------------------------------------------------------------------------------- Plan: Anti estrogen therapy with letrozole 2.5 mg daily. To start 08/23/2016 If she is unable to tolerate antiestrogen therapy then I would immediately discontinue it because she is only weak ER and PR positivity Return to clinic in 2 months for toxicity check and follow-up.  I spent 25 minutes talking to the patient of which more than half was spent in counseling and coordination of care.  No orders of  the defined types were placed in this encounter.  The patient has a good understanding of the overall plan. she agrees with it. she will call with any problems that may develop before the next visit here.   Rulon Eisenmenger, MD 07/26/16

## 2016-07-26 NOTE — Telephone Encounter (Signed)
Called pt to congratulate on completion of xrt. Relate doing well and without complaints. Discussed survivorship program. Encourage pt to call with needs. Received verbal understanding.

## 2016-07-30 NOTE — Progress Notes (Signed)
  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 The patient missed treatments from 06/22/16 - 07/04/16 due to acute bronchitis, the flu, syncope, and seizures. Towards the end of treatment, the patient had a small area of skin breakdown in the medial right inframammary fold with no sign of infection and the remainders of the breast showed mild erythema and mild hyperpigmentation changes. The patient had a right axillary seroma that bothered her at times.  Plan: The patient has completed radiation treatment. The patient will return to radiation oncology clinic for routine followup in one month. I advised them to call or return sooner if they have any questions or concerns related to their recovery or treatment.  -----------------------------------  Blair Promise, PhD, MD  This document serves as a record of services personally performed by Gery Pray, MD. It was created on his behalf by Darcus Austin, a trained medical scribe. The creation of this record is based on the scribe's personal observations and the provider's statements to them. This document has been checked and approved by the attending provider.

## 2016-08-17 ENCOUNTER — Telehealth: Payer: Self-pay | Admitting: Nurse Practitioner

## 2016-08-17 ENCOUNTER — Other Ambulatory Visit: Payer: Self-pay | Admitting: Endocrinology

## 2016-08-17 DIAGNOSIS — R55 Syncope and collapse: Secondary | ICD-10-CM

## 2016-08-17 NOTE — Telephone Encounter (Signed)
Patient is calling and asking for a referral to neurology to see Dr. Carles Collet. She states that she was seen in th office with Dr. Lovena Le in January for syncope and hypotension. She states that she feels like she should be seen by a neurologist and would like a referral. Please advise.

## 2016-08-17 NOTE — Telephone Encounter (Signed)
New message    pt verbalized that she is calling to speak to rn  To get a referral to see a neurologist

## 2016-08-17 NOTE — Telephone Encounter (Signed)
Referral was made for patient to see Dr. Carles Collet. Patient was called and notified and verbalized understanding.

## 2016-08-17 NOTE — Telephone Encounter (Signed)
Ok to refer her to Dr. Carles Collet.

## 2016-08-21 ENCOUNTER — Encounter: Payer: Self-pay | Admitting: Neurology

## 2016-08-23 ENCOUNTER — Other Ambulatory Visit: Payer: Self-pay | Admitting: Emergency Medicine

## 2016-08-23 MED ORDER — LETROZOLE 2.5 MG PO TABS: 2.5000 mg | ORAL_TABLET | Freq: Every day | ORAL | 3 refills | Status: DC

## 2016-08-24 IMAGING — CT CT RENAL STONE PROTOCOL
1 of 2 series · 15 of 32 positions shown, 19 images · non-contrast
Comparison: 01/31/2012

CLINICAL DATA: Right-sided abdominal pain for 3 hours.

EXAM:
CT ABDOMEN AND PELVIS WITHOUT CONTRAST
TECHNIQUE: Multidetector CT imaging of the abdomen and pelvis was performed
following the standard protocol without IV contrast.

[Series 2: renal stone > 200 lbs 5.0 b31f · axial · 0.87mm/px · z∈[+884,+1284]mm · 15 of 90 slices shown, 19 images]
[im 5/90  soft-tissue]
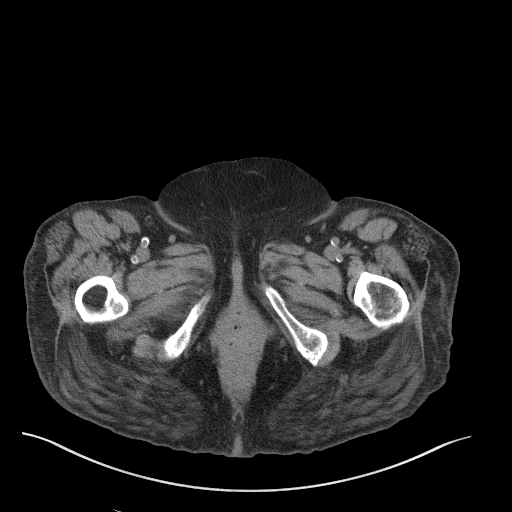
[im 5/90  bone]
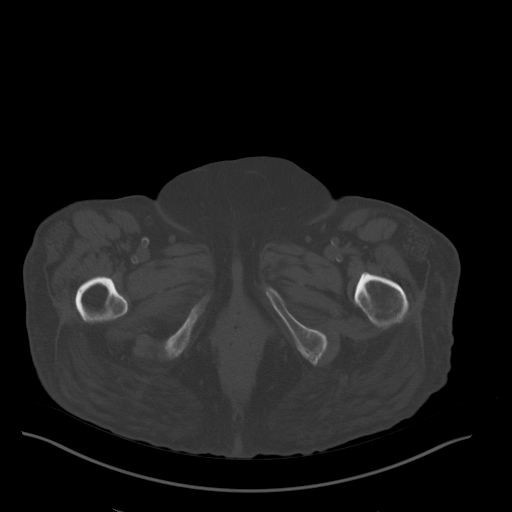
[im 13/90  soft-tissue]
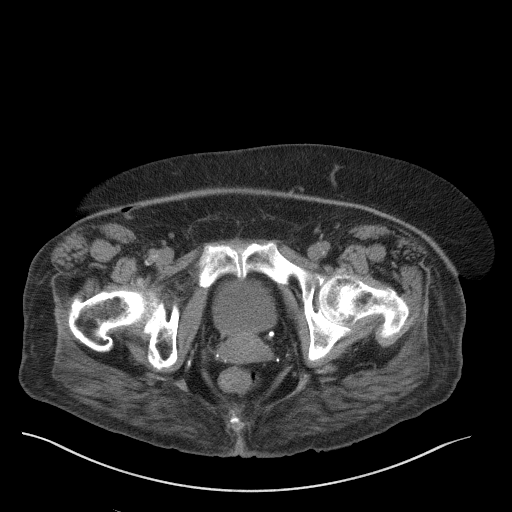
[im 21/90  soft-tissue]
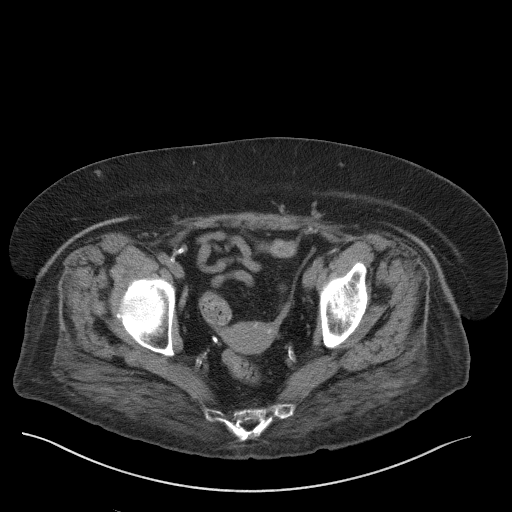
[im 25/90  soft-tissue]
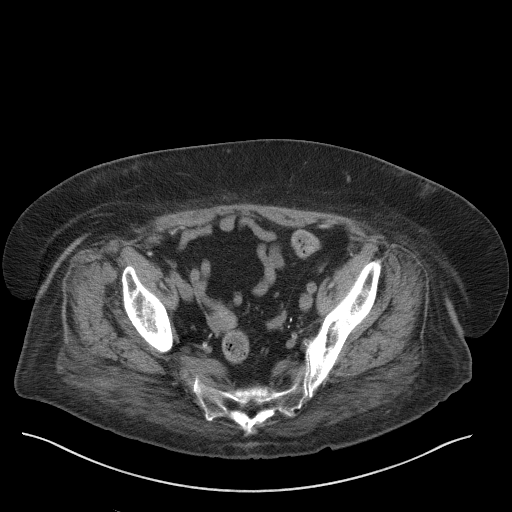
[im 33/90  soft-tissue]
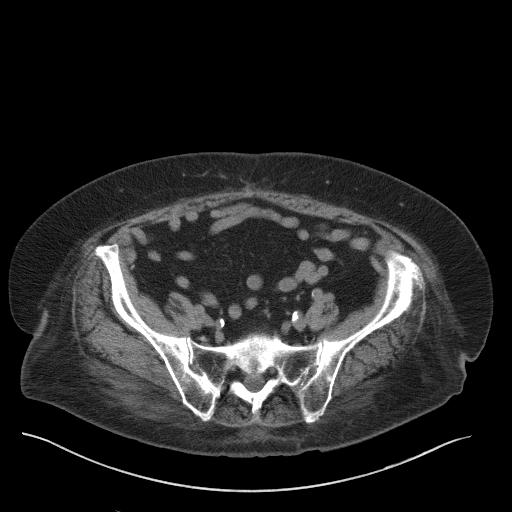
[im 37/90  soft-tissue]
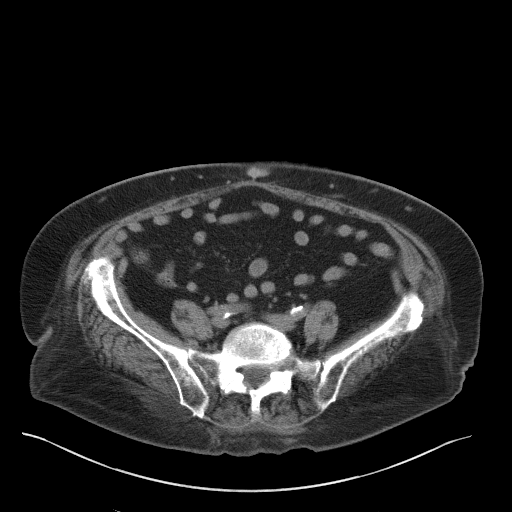
[im 45/90  soft-tissue]
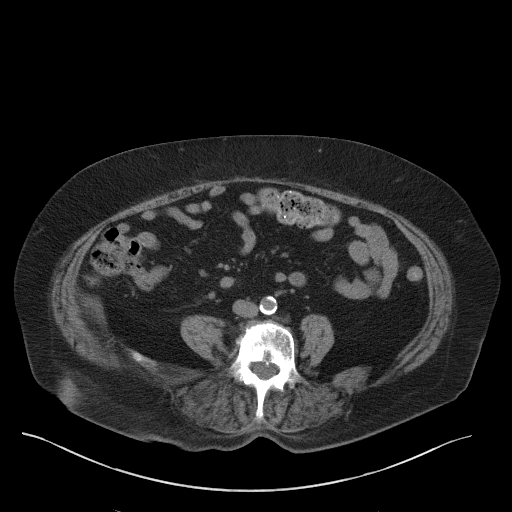
[im 53/90  soft-tissue]
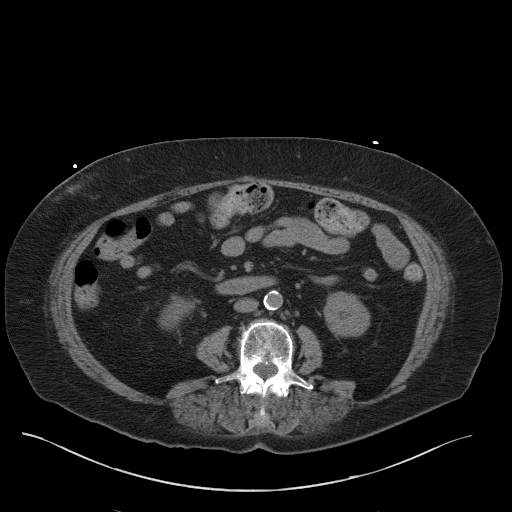
[im 57/90  soft-tissue]
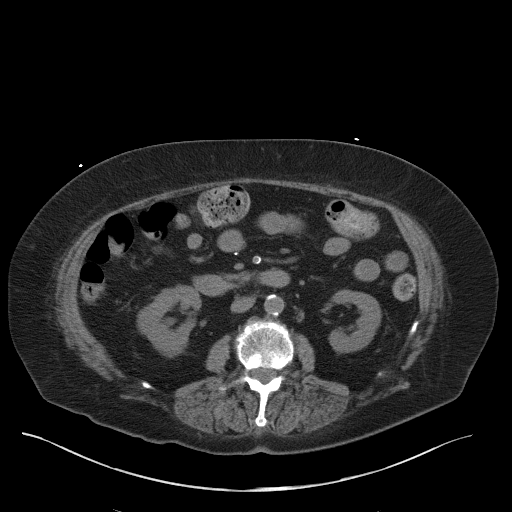
[im 57/90  bone]
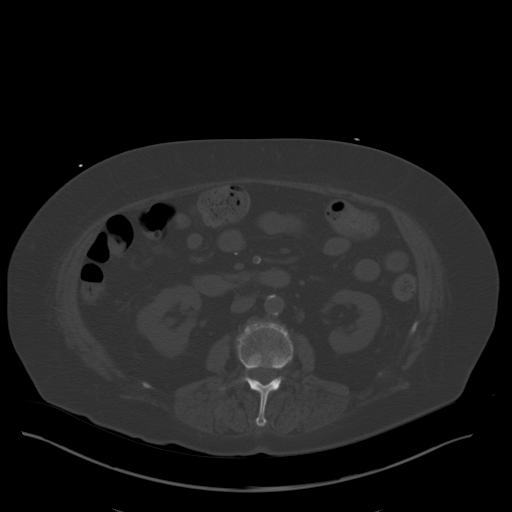
[im 65/90  soft-tissue]
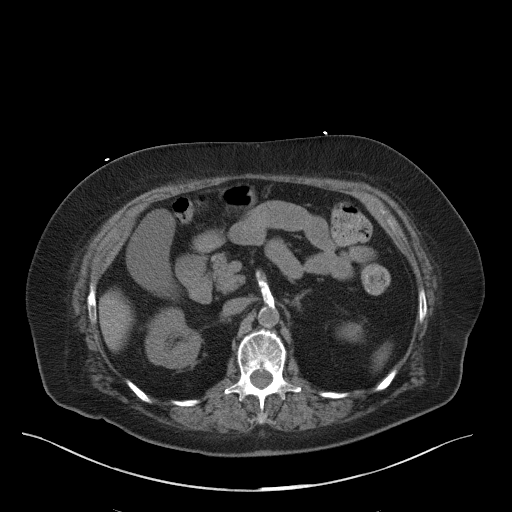
[im 69/90  soft-tissue]
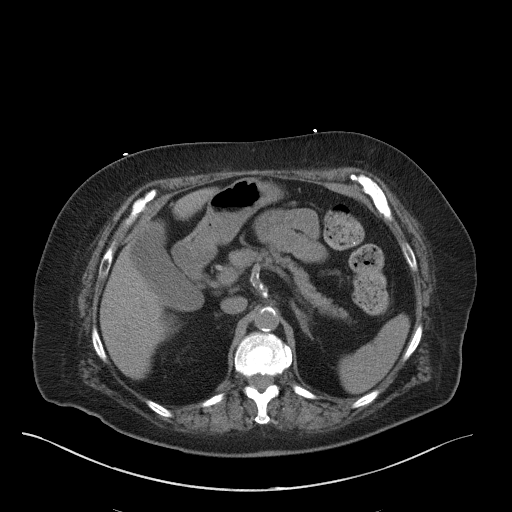
[im 73/90  lung]
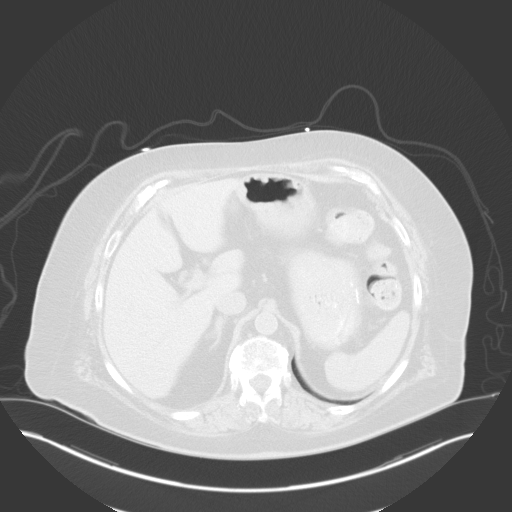
[im 77/90  soft-tissue]
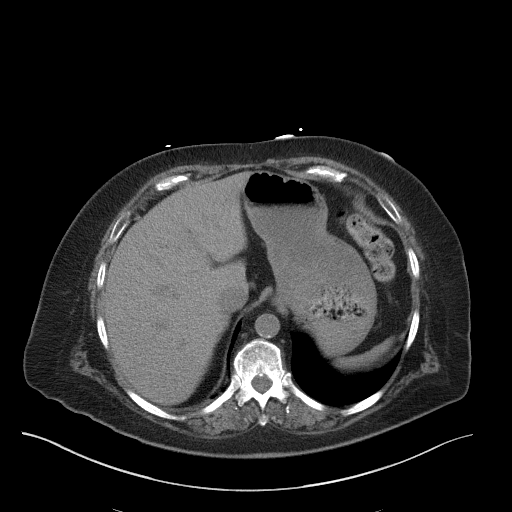
[im 77/90  lung]
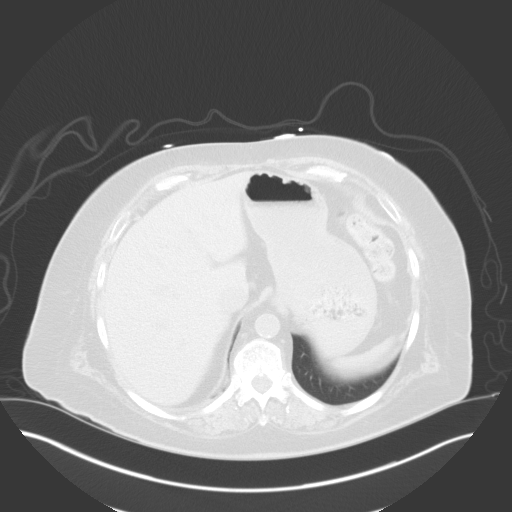
[im 81/90  lung]
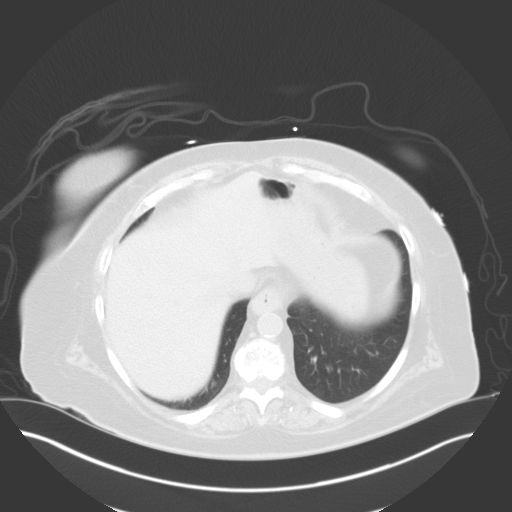
[im 85/90  soft-tissue]
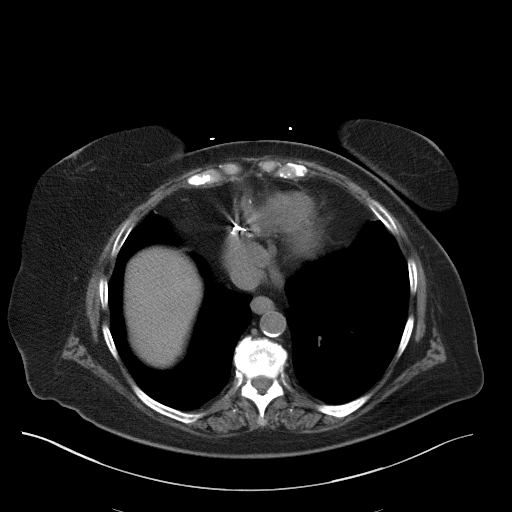
[im 85/90  lung]
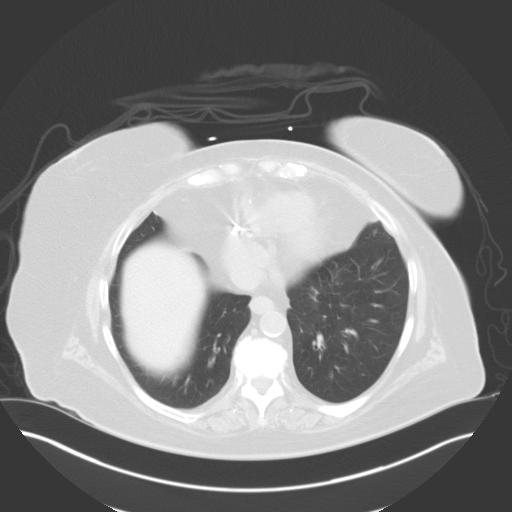

[15 of 32 positions shown; findings below may reference images not displayed]

FINDINGS: There is an obstructing proximal right ureteral calculus at the
upper L3 level. The calculus measures 5 mm in cross-section and 9 mm
longitudinal. There is moderate right hydronephrosis. There is a 10
mm calculus in the right upper pole collecting system with branching
morphology suggesting incipient staghorn formation. No left-sided
urinary calculi are evident. There were no urinary calculi evident
on the 01/31/2012 examination.

No other acute findings are evident in the abdomen or pelvis. Small
calculi are probably present within the gallbladder lumen. There are
unremarkable unenhanced appearances of the liver, bile ducts,
spleen, pancreas and adrenals. The abdominal aorta is normal in
caliber with moderate atherosclerotic calcification. Bowel appears
normal. Uterus and ovaries appear normal.

There is no significant abnormality in the lower chest.

There is no significant musculoskeletal lesion. There is moderately
severe degenerative disc change at L4-5.
IMPRESSION: 1. Obstructing right ureteral calculus at the upper L3 level,
measuring 5 x 5 x 9 mm. Moderate hydronephrosis
2. Right upper pole collecting system calculus measuring 10 mm, with
branching morphology suggesting incipient staghorn formation.
Significant interval increase in stone burden, with no calculi
visible on the 9872 CT examination.
3. Probable cholelithiasis.  No acute biliary findings.

## 2016-08-27 ENCOUNTER — Other Ambulatory Visit: Payer: Self-pay | Admitting: Emergency Medicine

## 2016-08-27 DIAGNOSIS — E860 Dehydration: Secondary | ICD-10-CM

## 2016-08-30 ENCOUNTER — Ambulatory Visit
Admission: RE | Admit: 2016-08-30 | Discharge: 2016-08-30 | Disposition: A | Discharge status: Home / Self Care | Payer: Medicare Other | Source: Ambulatory Visit | Attending: Radiation Oncology | Admitting: Radiation Oncology

## 2016-08-30 VITALS — BP 121/49 | HR 64 | Temp 98.3°F | Resp 18 | Wt 199.4 lb

## 2016-08-30 DIAGNOSIS — Z7902 Long term (current) use of antithrombotics/antiplatelets: Secondary | ICD-10-CM | POA: Diagnosis not present

## 2016-08-30 DIAGNOSIS — Z91040 Latex allergy status: Secondary | ICD-10-CM | POA: Insufficient documentation

## 2016-08-30 DIAGNOSIS — Z79899 Other long term (current) drug therapy: Secondary | ICD-10-CM | POA: Insufficient documentation

## 2016-08-30 DIAGNOSIS — Z888 Allergy status to other drugs, medicaments and biological substances status: Secondary | ICD-10-CM | POA: Diagnosis not present

## 2016-08-30 DIAGNOSIS — Z7982 Long term (current) use of aspirin: Secondary | ICD-10-CM | POA: Diagnosis not present

## 2016-08-30 DIAGNOSIS — Z794 Long term (current) use of insulin: Secondary | ICD-10-CM | POA: Diagnosis not present

## 2016-08-30 DIAGNOSIS — Z171 Estrogen receptor negative status [ER-]: Secondary | ICD-10-CM | POA: Diagnosis not present

## 2016-08-30 DIAGNOSIS — Z7951 Long term (current) use of inhaled steroids: Secondary | ICD-10-CM | POA: Diagnosis not present

## 2016-08-30 DIAGNOSIS — Z170 Estrogen receptor positive status [ER+]: Secondary | ICD-10-CM

## 2016-08-30 DIAGNOSIS — Z923 Personal history of irradiation: Secondary | ICD-10-CM | POA: Diagnosis not present

## 2016-08-30 DIAGNOSIS — C50211 Malignant neoplasm of upper-inner quadrant of right female breast: Secondary | ICD-10-CM | POA: Insufficient documentation

## 2016-08-30 DIAGNOSIS — Z881 Allergy status to other antibiotic agents status: Secondary | ICD-10-CM | POA: Diagnosis not present

## 2016-08-30 NOTE — Progress Notes (Signed)
Radiation Oncology         [phone] ________________________________  Name: Hannah [name] MRN: [id number]  Date: 08/30/2016  DOB: 01-09-1946  Follow-Up Visit Note  CC: Saguier, Jacqualine Code, MD    ICD-9-CM ICD-10-CM   1. 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 The patient saw Dr. Dalbert Batman on 07/30/16 who aspirated 22 cc of fluid from a seroma in the right axilla. That was the second aspiration she had in that area. She was started on Bactrim BID for 10 days. The fluid was sent to culture. She saw Dr. Lindi Adie on 07/26/16 who recommended the patient to start Letrozole 2.5 mg daily on 08/23/16.  Patient denies any pain. Patient reports only soreness of the left breast from porta cath was removal. Patient reports having some fatigue. She reports her appetite is sufficient and no side effects at this time from Letrozole.  ALLERGIES:  is allergic to amoxicillin; imdur [isosorbide dinitrate]; latex; and tetracycline.  Meds: Current Outpatient Prescriptions  Medication Sig Dispense Refill  . albuterol (PROVENTIL HFA;VENTOLIN HFA) 108 (90 Base) MCG/ACT inhaler Inhale 2 puffs into the lungs every 6 (six) hours as needed for wheezing or shortness of breath.    Marland Kitchen alendronate (FOSAMAX) 70 MG tablet Take 70 mg by mouth every Wednesday. Take with a full glass of water on an empty stomach.     Marland Kitchen aspirin EC 81 MG EC tablet Take 1 tablet (81 mg total) by mouth daily.    . clopidogrel (PLAVIX) 75 MG tablet Take 75  mg by mouth daily.    Marland Kitchen ezetimibe (ZETIA) 10 MG tablet TAKE 1 TABLET BY MOUTH  DAILY 90 tablet 2  . furosemide (LASIX) 80 MG tablet Take 0.5 tablets (40 mg total) by mouth daily as needed. 1 tablet in the AM and 1/2 tablet (40 mg) in the early afternoon. 90 tablet 3  . gabapentin (NEURONTIN) 300 MG capsule Take 1 capsule (300 mg total) by mouth 3 (three) times daily. 90 capsule 6  . glucose blood (ACCU-CHEK GUIDE) test strip Use to test blood sugar 3 times daily 100 each 5  . Insulin Disposable Pump (V-GO 40) KIT USE 1 PER DAY 90 kit 1  . insulin lispro (HUMALOG KWIKPEN) 100 UNIT/ML KiwkPen INJECT 10 UNITS INTO THE SKIN WITH MEALS PLUS SLIDING SCALE (Patient taking differently: Inject 10-12 Units into the skin daily as needed. per SLIDING SCALE) 15 mL 2  . insulin lispro (HUMALOG) 100 UNIT/ML injection Inject 76 units daily with  V-GO pump 70 mL 2  . loratadine (CLARITIN) 10 MG tablet Take 10 mg by mouth daily.    . midodrine (PROAMATINE) 5 MG tablet Take 1 tablet (5 mg total) by mouth 3 (three) times daily with meals. 90 tablet 1  . potassium chloride SA (KLOR-CON M20) 20 MEQ tablet Take 1 tablet (20 mEq total) by mouth daily as needed (when you take lasix). 60 tablet 3  . rosuvastatin (CRESTOR) 40 MG  tablet TAKE ONE TABLET BY MOUTH ONCE DAILY (Patient taking differently: TAKE 33m TABLET BY MOUTH ONCE DAILY) 30 tablet 11  . sotalol (BETAPACE) 120 MG tablet Take 1 tablet (120 mg total) by mouth every 12 (twelve) hours. 60 tablet 1  . SYMBICORT 160-4.5 MCG/ACT inhaler INHALE TWO PUFFS BY MOUTH TWICE DAILY 10.2 g 2  . TRULICITY 09.83MJA/2.5KNSOPN Inject 0.75 mg into the skin every Wednesday.     .Marland Kitchenipratropium-albuterol (DUONEB) 0.5-2.5 (3) MG/3ML SOLN Take 3 mLs by nebulization 4 (four) times daily. 360 mL 0  . letrozole (FEMARA) 2.5 MG tablet Take 1 tablet (2.5 mg total) by mouth daily. (Patient not taking: Reported on 08/30/2016) 90 tablet 3   No current facility-administered medications for this  encounter.     Physical Findings: The patient is in no acute distress. Patient is alert and oriented.  weight is 199 lb 6.4 oz (90.4 kg). Her oral temperature is 98.3 F (36.8 C). Her blood pressure is 121/49 (abnormal) and her pulse is 64. Her respiration is 18 and oxygen saturation is 99%. .  No significant changes.  Lungs are clear to auscultation bilaterally. Heart has regular rate and rhythm. No palpable cervical, supraclavicular, or axillary adenopathy. Abdomen soft, non-tender, normal bowel sounds. Left breast no mass or nipple discharge. Scar in the upper left breast from port-a-cath removal, surgical glue in place. Of the right breast, the patient has a bandage in the right axilla from a recent seroma aspiration, not removed. The patient continues to have erythema and hyperpigmenation changes of the right breast. No palpable mass or nipple bleeding of the right breast.  Lab Findings: Lab Results  Component Value Date   WBC 5.2 07/10/2016   HGB 10.4 (L) 07/10/2016   HCT 30.9 (L) 07/10/2016   MCV 92.1 07/10/2016   PLT 129.0 (L) 07/10/2016    Radiographic Findings: No results found.  Impression:  The patient is recovering from the effects of radiation. No sign of recurrence on clinical exam.  Plan: The patient will continue follow up with Dr. GLindi Adieand Dr. IDalbert Batman The patient will follow up with radiation oncology in June. ____________________________________ -----------------------------------  JBlair Promise PhD, MD  This document serves as a record of services personally performed by JGery Pray MD. It was created on his behalf by JDarcus Austin a trained medical scribe. The creation of this record is based on the scribe's personal observations and the provider's statements to them. This document has been checked and approved by the attending provider.

## 2016-08-30 NOTE — Progress Notes (Signed)
Hannah [name] here today for 1 month follow up for right breast cancer.  Patient denies any pain. Patient reports only soreness to right breast because porta cath was removed 08/28/2016 and fluid from lympnode to right breast was removed also on 08/28/2016.  Patient reports having some fatigue.  She reports her appetite is sufficient.  She says Dr. Lindi Adie started her on Letrozole since last appointment.    Vitals:   08/30/16 0957  BP: (!) 121/49  Pulse: 64  Resp: 18  Temp: 98.3 F (36.8 C)  TempSrc: Oral  SpO2: 99%  Weight: 199 lb 6.4 oz (90.4 kg)    Wt Readings from Last 3 Encounters:  08/30/16 199 lb 6.4 oz (90.4 kg)  07/26/16 203 lb 12.8 oz (92.4 kg)  07/24/16 203 lb 3.2 oz (92.2 kg)

## 2016-09-04 ENCOUNTER — Other Ambulatory Visit: Payer: Self-pay | Admitting: Nurse Practitioner

## 2016-10-01 ENCOUNTER — Encounter: Payer: Self-pay | Admitting: Nurse Practitioner

## 2016-10-01 ENCOUNTER — Ambulatory Visit (INDEPENDENT_AMBULATORY_CARE_PROVIDER_SITE_OTHER): Payer: Medicare Other | Admitting: Nurse Practitioner

## 2016-10-01 VITALS — BP 120/62 | HR 67 | Ht 65.0 in | Wt 193.1 lb

## 2016-10-01 DIAGNOSIS — I2510 Atherosclerotic heart disease of native coronary artery without angina pectoris: Secondary | ICD-10-CM

## 2016-10-01 DIAGNOSIS — I10 Essential (primary) hypertension: Secondary | ICD-10-CM

## 2016-10-01 NOTE — Patient Instructions (Addendum)
We will be checking the following labs today - NONE   Medication Instructions:    Continue with your current medicines.     Testing/Procedures To Be Arranged:  N/A  Follow-Up:   See me in 6 months.    Other Special Instructions:   N/A    If you need a refill on your cardiac medications before your next appointment, please call your pharmacy.   Call the [facility] office at [phone] if you have any questions, problems or concerns.      

## 2016-10-01 NOTE — Progress Notes (Signed)
CARDIOLOGY OFFICE NOTE  Date:  10/01/2016    Hannah [name] Date of Birth: 1946/05/31 Medical Record #[id number]  PCP:  Mackie Pai, PA-C  Cardiologist:  Annabell Howells    Chief Complaint  Patient presents with  . Congestive Heart Failure    Follow up visit - seen for Dr. Aundra Dubin    History of Present Illness: Hannah [name] is a 71 y.o. female who presents today for a follow up visit. She is a former patient of Dr. Susa Simmonds. Seen for Dr. Aundra Dubin. She now follows with me.   She has a history of CAD s/p CABG, bioprosthetic AVR, CKD, paroxysmal atrial fibrillation presents for cardiology followup. She had her initial CABG in 1998, then had redo CABG in 2009 with bioprosthetic AVR. She does not remember when she last had documented atrial fibrillation, but she thinks it was a number of years ago, sounds peri-operative around the time of her redo CABG. She was never put on anticoagulation but had been on dronedarone.   In 9/14, she developed tachypalpitations and later chest pain. In the ER, SBP was in the 200s with elevated HR. Troponin was mildly elevated. Therefore, she had a cardiac cath showing patent grafts. She was thought to have had demand ischemia in the setting of hypertensive emergency + tachycardia. After discharge, she continued to have runs of tachypalpitations. A 3 week event monitor was put on her. This showed runs, sometimes long, of atrial tachycardia. She was started on Toprol XL when these runs were noted. No further palpitations (complete resolution of symptoms).   In 1/15, she was admitted to [facility] with atypical chest pain. She was ruled out for MI and Lexiscan Cardiolite showed no ischemia. She was started on Imdur and sent home. After discharge, she developed a daily headache and nausea. She did not eat or drink much due to the nausea. Shortly after this, she was standing in her grandson's pediatrician's office and became severely  lightheaded. She had to sit down. She came to the ER and was found to be orthostatic. She was given IV fluid and got better quickly. The Imdur was stopped and symptoms seem to have completely resolved.   Event monitor in 8/16 showed atrial tachycardia versus atypical atrial flutter. Toprol XL was increased to 100 mg bid. She saw Dr Lovena Le who thought that she had short runs of atrial tachycardia and not atrial flutter. Therefore, she has not been anticoagulated. She havingoccasional brief palpitations but overall was felt to bedoing better.   She saw Dr. Aundra Dubin back in April of 2017 - she was doing well.   Has been diagnosed with breast cancer since her last visit here. Has had surgery. Getting chemo/radiation - no Adriamycin due to her history of CHF.  I last saw her back in October- getting chemo. Overall seemed to be holding her on.   I have seen her back several times since has had issues with volume overload - unable to get her 4th dose of chemo due to severe fatigue,, neuropathy and cellulitis. Anemic. Referred back here from pulmonary. I diuresed her. Seen right after Thanksgiving - she was losing weight. Symptoms slowly improving. At last visit she continued to improve -weight was coming down but still above from when I saw her in October (198) - she was starting radiation. Last seen in early January - slowly getting better - swelling still somewhat of an issue.   Then saw Dr. Lovena Le towards the end of  January - she had been hospitalized after standing up and passing out with some seizure like activity. She was evaluated extensively and thought to have orthostasis. She was placed on midodrine and her lasix was changed to as needed only. ACE and beta blocker stopped. She was better on follow up.   Comes back today. Here alone today. She has finished her treatment for her breast cancer. Now on Femara. No irregular heart beats at all. She is off her metoprolol and ACE - feels fine.  Very little use of her Lasix. She can typically manage with elevating her legs and wear her compression hose. Weight continues to decline - she is trying to lose weight. Not dizzy or lightheaded. She fell last week - her back hurts - she is seeing Dr. Vertell Limber later this week. No chest pain. She seems to be doing more at home. She is happy with how she is doing other than her back.   PMH: 1. Diabetes mellitus 2. HTN 3. Obesity 4. Atrial arrhythmias: Paroxysmal atrial fibrillation seems to have been post-operative after CAD-AVR. She has a history of amiodarone toxicity. She has not been on coumadin. 3-week event monitor (9/13) with no atrial fibrillation. 3-week event monitor (10/14) with runs of probable atrial tachycardia. 30 day monitor in 8/16 with atrial tachycardia (multiple episodes).  5. CAD: CABG 1998 with LIMA-LAD/diagonal, sequential SVG-OM1/OM2, SVG-RCA. Redo CABG 2009 with SVG-OM3, SVG-PDA. Lexiscan myoview (9/13): EF 61%, no ischemia or infarction. LHC (10/14) with all grafts patent except the old SVG-RCA. There was 90% stenosis in the AV LCx before ungrafted MOM, but this was unchanged from prior study. Lexiscan Cardiolite (1/15) with no ischemia. She does not tolerate Imdur.  6. CKD 7. Bioprosthetic aortic valve: Echo (2011) with EF 50-55%, bioprosthetic aortic valve well-seated. Echo (2/13) with EF 60-65%, bioprosthetic aortic valve looked ok. Echo (9/14): EF 50-55%, mild LVH, bioprosthetic AVR with mean gradient 10, mild MR, moderate TR.  8. Asthma: since her teens. 9. Low back pain.  10. 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 Per neurologist, did not appear cardioembolic.  11. Carotid stenosis: 40-59% bilateral ICA stenosis on 10/14 carotid dopplers. Carotid doppler (11/15) with 40-59% bilateral ICA stenosis. Carotid dopplers (11/16) with 40-59% BICA stenosis.  12. Sleep study 12/14 without significant OSA.  13. Chronic diastolic CHF   Past Medical History:    Diagnosis Date  . Abnormally small mouth   . Anemia   . Bilateral carotid artery stenosis    Bilateral ICA 40-59%/  >50% LECA  . Breast cancer ([place])   . Breast cancer of upper-inner quadrant of right female breast ([place]) 12/16/2015  . CAD (coronary artery disease) cardiologist-  dr Aundra Dubin   Remote CABG in 1998 with redo in 2009  . CHF (congestive heart failure) (H[facility])    EF is low normal at 50 to 55% per echo in Jan. 2011  . GERD (gastroesophageal reflux disease)   . Hearing loss of right ear   . History of non-ST elevation myocardial infarction (NSTEMI)    Sept 2014--  thought to be type II HTN w/ LHC without infarct related artery and patent grafts  . Hyperlipidemia   . Hypertension    Resolved.  . Iron deficiency anemia   . Moderate persistent asthma    pulmologist-  Dr. Malvin Johns  . Nonischemic cardiomyopathy ([place])   . PAF (paroxysmal atrial fibrillation) ([place])   . Psoriasis    right leg  . Renal calculus, right   .  Renal insufficiency, mild   . S/P AVR    prosthesis valve placement 2009 at same time re-do CABG  . Stroke [facility])    residual rt hearing loss  . Type 2 diabetes mellitus ([place])    monitored by dr Dwyane Dee    Past Surgical History:  Procedure Laterality Date  . AORTIC VALVE REPLACEMENT  2009   [facility]done same time is [facility])  . BREAST LUMPECTOMY WITH RADIOACTIVE SEED AND SENTINEL LYMPH NODE BIOPSY Right 01/18/2016   Procedure: RIGHT BREAST LUMPECTOMY WITH RADIOACTIVE SEED AND SENTINEL LYMPH NODE BIOPSY;  Surgeon: DAlphonsa Overall MD;  [facility]  Service: General;  Laterality: Right;  . CARDIAC CATHETERIZATION  03/23/2008   Pre-redo CABG: L main OK, LAD (T), CFX (T), OM1 99%, RCA (T), LIMA-LAD OK, SVG-OM(?3) OK w/ little florw to OM2, SVG-RCA OK. EF NL  . CARPAL TUNNEL RELEASE    . COLONOSCOPY    . CORONARY ARTERY BYPASS GRAFT  1998 &  re-do 2009   Had LIMA to DX/LAD, SV[facility] 2 marginal [facility] SVG to 3rd  OM and PD at time of redo  . CYSTOSCOPY W/ URETERAL STENT PLACEMENT Right 12/20/2014   Procedure: CYSTOSCOPY WITH RETROGRADE PYELOGRAM/URETERAL STENT PLACEMENT;  Surgeon: PCleon Gustin MD;  [facility]  Service: Urology;  Laterality: Right;  . EYE SURGERY Bilateral    cataracts  . HOLMIUM LASER APPLICATION Right 76/50/3546  Procedure:  HOLMIUM LASER LITHOTRIPSY;  Surgeon: PCleon Gustin MD;  [facility]  Service: Urology;  Laterality: Right;  . LEFT HEART CATHETERIZATION WITH CORONARY/GRAFT ANGIOGRAM N/A 02/23/2013   Procedure: LEFT HEART CATHETERIZATION WITH CBeatrix Fetters  Surgeon: MBlane Ohara MD;  [name];  Service: Cardiovascular;  Laterality: N/A;  . PORTACATH PLACEMENT Left 01/18/2016   Procedure: INSERTION PORT-A-CATH;  Surgeon: DAlphonsa Overall MD;  [place] [facility]  Service: General;  Laterality: Left;  . TONSILLECTOMY    . TRANSTHORACIC ECHOCARDIOGRAM  02-24-2013      mild LVH,  ef 50-55%/  AV bioprosthesis was present with very mild stenosis and no regurg., mean grandient 186mg, peak grandient 2057m /  mild MR/  mild LAE and RAE/  moderate TR  . TUBAL LIGATION       Medications: Current Outpatient Prescriptions  Medication Sig Dispense Refill  . albuterol (PROVENTIL HFA;VENTOLIN HFA) 108 (90 Base) MCG/ACT inhaler Inhale 2 puffs into the lungs every 6 (six) hours as needed for wheezing or shortness of breath.    . aMarland Kitchenendronate (FOSAMAX) 70 MG tablet Take 70 mg by mouth every Wednesday. Take with a full glass of water on an empty stomach.     . aMarland Kitchenpirin EC 81 MG EC tablet Take 1 tablet (81 mg total) by mouth daily.    . clopidogrel (PLAVIX) 75 MG tablet Take 75 mg by mouth daily.    . dMarland Kitchenxycycline (VIBRA-TABS) 100 MG tablet Take 100 mg by mouth 2 (two) times daily.     . eMarland Kitchenetimibe (ZETIA) 10 MG tablet TAKE 1 TABLET BY MOUTH  DAILY 90 tablet 2  . furosemide (LASIX) 80 MG tablet Take 0.5 tablets  (40 mg total) by mouth daily as needed. 1 tablet in the AM and 1/2 tablet (40 mg) in the early afternoon. 90 tablet 3  . gabapentin (NEURONTIN) 300 MG capsule Take 1 capsule (300 mg total) by mouth 3 (three) times daily. 90 capsule 6  .  glucose blood (ACCU-CHEK GUIDE) test strip Use to test blood sugar 3 times daily 100 each 5  . Insulin Disposable Pump (V-GO 40) KIT USE 1 PER DAY 90 kit 1  . insulin lispro (HUMALOG KWIKPEN) 100 UNIT/ML KiwkPen INJECT 10 UNITS INTO THE SKIN WITH MEALS PLUS SLIDING SCALE (Patient taking differently: Inject 10-12 Units into the skin daily as needed. per SLIDING SCALE) 15 mL 2  . insulin lispro (HUMALOG) 100 UNIT/ML injection Inject 76 units daily with  V-GO pump 70 mL 2  . letrozole (FEMARA) 2.5 MG tablet Take 1 tablet (2.5 mg total) by mouth daily. 90 tablet 3  . loratadine (CLARITIN) 10 MG tablet Take 10 mg by mouth daily.    . midodrine (PROAMATINE) 5 MG tablet ONE TABLET BY MOUTH THREE TIMES DAILY WITH MEALS 90 tablet 6  . potassium chloride SA (KLOR-CON M20) 20 MEQ tablet Take 1 tablet (20 mEq total) by mouth daily as needed (when you take lasix). 60 tablet 3  . rosuvastatin (CRESTOR) 40 MG tablet TAKE ONE TABLET BY MOUTH ONCE DAILY (Patient taking differently: TAKE 39m TABLET BY MOUTH ONCE DAILY) 30 tablet 11  . sotalol (BETAPACE) 120 MG tablet TAKE ONE TABLET BY MOUTH EVERY TWELVE HOURS 60 tablet 6  . SYMBICORT 160-4.5 MCG/ACT inhaler INHALE TWO PUFFS BY MOUTH TWICE DAILY 10.2 g 2  . traMADol (ULTRAM-ER) 200 MG 24 hr tablet     . TRULICITY 01.61MWR/6.0AVSOPN Inject 0.75 mg into the skin every Wednesday.     .Marland Kitchenipratropium-albuterol (DUONEB) 0.5-2.5 (3) MG/3ML SOLN Take 3 mLs by nebulization 4 (four) times daily. 360 mL 0   No current facility-administered medications for this visit.     Allergies: Allergies  Allergen Reactions  . Amoxicillin Rash    Has patient had a PCN reaction causing immediate rash, facial/tongue/throat swelling, SOB or  lightheadedness with hypotension: No Has patient had a PCN reaction causing severe rash involving mucus membranes or skin necrosis: Yes Has patient had a PCN reaction that required hospitalization No Has patient had a PCN reaction occurring within the last 10 years: No If all of the above answers are "NO", then may proceed with Cephalosporin use.   . Imdur [Isosorbide Dinitrate] Other (See Comments)    Headache/severe hypotension/Syncope  . Latex Itching  . Tetracycline Rash    Social History: The patient  reports that she has never smoked. She has never used smokeless tobacco. She reports that she does not drink alcohol or use drugs.   Family History: The patient's family history includes Diabetes in her maternal grandmother and son; Healthy in her brother; Heart attack in her brother; Heart disease in her brother, father, and maternal grandmother; Heart failure in her father.   Review of Systems: Please see the history of present illness.   Otherwise, the review of systems is positive for none.   All other systems are reviewed and negative.   Physical Exam: VS:  BP 120/62   Pulse 67   Ht 5' 5"  (1.651 m)   Wt 193 lb 1.9 oz (87.6 kg)   BMI 32.14 kg/m  .  BMI Body mass index is 32.14 kg/m.  Wt Readings from Last 3 Encounters:  10/01/16 193 lb 1.9 oz (87.6 kg)  08/30/16 199 lb 6.4 oz (90.4 kg)  07/26/16 203 lb 12.8 oz (92.4 kg)    General: Pleasant. Well developed, well nourished and in no acute distress.   HEENT: Normal.  Neck: Supple, no JVD, carotid bruits, or  masses noted.  Cardiac: Regular rate and rhythm. No murmurs, rubs, or gallops. No edema.  Respiratory:  Lungs are clear to auscultation bilaterally with normal work of breathing.  GI: Soft and nontender.  MS: No deformity or atrophy. Gait and ROM intact.  Skin: Warm and dry. Color is normal.  Neuro:  Strength and sensation are intact and no gross focal deficits noted.  Psych: Alert, appropriate and with normal  affect.   LABORATORY DATA:  EKG:  EKG is ordered today. This demonstrates NSR.  Lab Results  Component Value Date   WBC 5.2 07/10/2016   HGB 10.4 (L) 07/10/2016   HCT 30.9 (L) 07/10/2016   PLT 129.0 (L) 07/10/2016   GLUCOSE 112 (H) 07/10/2016   CHOL 137 07/10/2016   TRIG 107.0 07/10/2016   HDL 33.70 (L) 07/10/2016   LDLDIRECT 109.2 03/01/2014   LDLCALC 82 07/10/2016   ALT 19 07/10/2016   AST 21 07/10/2016   NA 142 07/10/2016   K 4.7 07/10/2016   CL 108 07/10/2016   CREATININE 1.17 07/10/2016   BUN 20 07/10/2016   CO2 34 (H) 07/10/2016   TSH 1.431 07/02/2016   INR 1.06 06/15/2013   HGBA1C 6.4 05/08/2016   MICROALBUR 24.3 (H) 09/19/2015    BNP (last 3 results)  Recent Labs  05/08/16 1144 05/23/16 1143 06/22/16 2109  BNP 209.4* 243.3* 257.0*    ProBNP (last 3 results) No results for input(s): PROBNP in the last 8760 hours.   Other Studies Reviewed Today:  Echo Study Conclusions from 03/2016  - Left ventricle: The cavity size was normal. There was moderate focal basal and mild concentric hypertrophy. Systolic function was normal. The estimated ejection fraction was in the range of 60% to 65%. Images were inadequate for LV wall motion assessment. Features are consistent with a pseudonormal left ventricular filling pattern, with concomitant abnormal relaxation and increased filling pressure (grade 2 diastolic dysfunction). Doppler parameters are consistent with high ventricular filling pressure. - Aortic valve: A bioprosthesis was present and functioning normally. Mean gradient (S): 17 mm Hg. - Mitral valve: Moderately calcified annulus. - Pulmonary arteries: PA peak pressure: 38 mm Hg (S).  Impressions:  - The right ventricular systolic pressure was increased consistent with mild pulmonary hypertension.   MYOVIEW FINDINGS FROM 06/2013: Normal resting EKG. Slight ST depressions noted in aVL after administration of lexiscan.  Mild shortness of breath and nausea resolved after the test. EKG is nondiagnostic for ischemia. TID ratio 1.05. Lung-heart ratio 0.43. Normal ventricular chamber size.  IMPRESSION: No evidence for ischemia. Normal wall motion. LVEF 72%.   Electronically Signed By: Guy Sandifer On: 06/15/2013 14:27    Assessment/Plan:  1. Prior syncope- orthostasis - resolved - I have left her on her current regimen.   2. Atrial arrhythmias - she seems to be asymptomatic at this point. She will continue sotalol.  3. CAD -she denies anginal symptoms. Will follow.  Admission in 9/14 with mild troponin elevation. LHC showed stable anatomy with patent grafts except for SVG-RCA from older CABG. Suspect that the troponin elevation was due to demand ischemia in the setting of hypertensive emergency and tachycardia (possible run of atrial tachycardia). Admission in 1/15 with atypical chest pain, had Lexiscan Cardiolite that showed no ischemia. No recent chest pain.   4. Chronic diastolic HF - looks very compensated.   5. Anemia - recent labs with renal noted.   6. Bioprosthetic aortic valve Most recent echo from October noted  6. Carotid bruit She was to have  repeat dopplers last fall - we need to get this updated. Will arrange.   7. CKD  8. Breast cancer- now on Femara.  Current medicines are reviewed with the patient today.  The patient does not have concerns regarding medicines other than what has been noted above.  The following changes have been made:  See above.  Labs/ tests ordered today include:    Orders Placed This Encounter  Procedures  . Rhythm ECG, report     Disposition:   FU with mein 6 months.   Patient is agreeable to this plan and will call if any problems develop in the interim.   SignedTruitt Merle, NP  10/01/2016 1:56 PM  [facility] [address] [place], [place]  [address] Phone: [phone] Fax: [phone]

## 2016-10-02 ENCOUNTER — Telehealth: Payer: Self-pay | Admitting: *Deleted

## 2016-10-02 NOTE — Telephone Encounter (Signed)
lvm to schedule carotids.

## 2016-10-02 NOTE — Telephone Encounter (Signed)
-----   Message from [name], NP sent at 10/01/2016  2:04 PM EDT ----- Can we get a carotid doppler sometime on her?  Hannah [name]

## 2016-10-02 NOTE — Addendum Note (Signed)
Addended by: Gaetano Net on: 10/02/2016 12:46 PM   Modules accepted: Orders

## 2016-10-03 NOTE — Telephone Encounter (Signed)
Pt called to let [name] know someone called yesterday and already scheduled pt's carotid doppler's.  Stated appreciation.

## 2016-10-04 ENCOUNTER — Other Ambulatory Visit: Payer: Self-pay | Admitting: Medical

## 2016-10-04 ENCOUNTER — Encounter: Payer: Self-pay | Admitting: Pharmacist

## 2016-10-04 ENCOUNTER — Ambulatory Visit (HOSPITAL_BASED_OUTPATIENT_CLINIC_OR_DEPARTMENT_OTHER): Payer: Medicare Other | Admitting: Hematology and Oncology

## 2016-10-04 ENCOUNTER — Encounter: Payer: Self-pay | Admitting: Hematology and Oncology

## 2016-10-04 DIAGNOSIS — C50211 Malignant neoplasm of upper-inner quadrant of right female breast: Secondary | ICD-10-CM | POA: Diagnosis not present

## 2016-10-04 DIAGNOSIS — Z170 Estrogen receptor positive status [ER+]: Secondary | ICD-10-CM

## 2016-10-04 DIAGNOSIS — I509 Heart failure, unspecified: Secondary | ICD-10-CM

## 2016-10-04 DIAGNOSIS — E119 Type 2 diabetes mellitus without complications: Secondary | ICD-10-CM | POA: Diagnosis not present

## 2016-10-04 DIAGNOSIS — M1380 Other specified arthritis, unspecified site: Secondary | ICD-10-CM | POA: Diagnosis not present

## 2016-10-04 DIAGNOSIS — R55 Syncope and collapse: Secondary | ICD-10-CM

## 2016-10-04 DIAGNOSIS — I739 Peripheral vascular disease, unspecified: Secondary | ICD-10-CM

## 2016-10-04 DIAGNOSIS — I779 Disorder of arteries and arterioles, unspecified: Secondary | ICD-10-CM

## 2016-10-04 NOTE — Progress Notes (Signed)
Patient Care Team: Mackie Pai, PA-C as PCP - General (Internal Medicine) Alphonsa Overall, MD as Consulting Physician (General Surgery) Nicholas Lose, MD as Consulting Physician (Hematology and Oncology) Gery Pray, MD as Consulting Physician (Radiation Oncology) Evans Lance, MD as Consulting Physician (Cardiology) Collene Gobble, MD as Consulting Physician (Pulmonary Disease) Gery Pray, MD as Consulting Physician (Radiation Oncology) Elayne Snare, MD as Consulting Physician (Endocrinology)  DIAGNOSIS:  Encounter Diagnosis  Name Primary?  . Malignant neoplasm of upper-inner quadrant of right breast in female, estrogen receptor positive ([place])     SUMMARY OF ONCOLOGIC HISTORY:   Breast cancer of upper-inner quadrant of right female breast ([place])   12/14/2015 Initial Diagnosis    Right breast biopsy 12:30 position: 2 masses, 1.6 cm mass: Invasive ductal carcinoma, grade 2, ER 0%, PR 0%, HER-2 negative, Ki-67 70%; satellite mass 8 mm: IDC grade 2 ER 5%, PR 5%, HER-2 negative, Ki-67 50%; T1cN0 stage IA clinical stage      01/18/2016 Surgery    Right lumpectomy: Multifocal IDC grade 3, 1.9 cm  ER 0%, PR 0%, HER-2 negative, Ki-67 70% and 0.8 cm satellite mass ER 5%, PR 2%, HER-2 negative, Ki-67 50%, high-grade DCIS, margins negative, 0/1 lymph nodes negative T1 cN0 stage IA      02/17/2016 - 04/06/2016 Chemotherapy    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       04/08/2016 - 04/23/2016 Hospital Admission    Hosp adm for cellulitis      05/24/2016 - 07/25/2016 Radiation Therapy     Adj XRT      06/29/2016 - 07/04/2016 Hospital Admission    Seizure like activity, MRI brain and EEG unremarkable       08/16/2016 -  Anti-estrogen oral therapy    Anastrozole 1 mg by mouth daily       CHIEF COMPLIANT: Follow-up on anastrozole  INTERVAL HISTORY: Hannah [name] is a 71 year old with above-mentioned history of right breast cancer  currently on antiestrogen therapy with anastrozole. She is tolerating it extremely well. She denies any hot flashes. She has chronic arthritis.  REVIEW OF SYSTEMS:   Constitutional: Denies fevers, chills or abnormal weight loss Eyes: Denies blurriness of vision Ears, nose, mouth, throat, and face: Denies mucositis or sore throat Respiratory: Denies cough, dyspnea or wheezes Cardiovascular: Denies palpitation, chest discomfort Gastrointestinal:  Denies nausea, heartburn or change in bowel habits Skin: Denies abnormal skin rashes Lymphatics: Denies new lymphadenopathy or easy bruising Neurological:Denies numbness, tingling or new weaknesses Behavioral/Psych: Mood is stable, no new changes  Extremities: No lower extremity edema  All other systems were reviewed with the patient and are negative.  I have reviewed the past medical history, past surgical history, social history and family history with the patient and they are unchanged from previous note.  ALLERGIES:  is allergic to amoxicillin; imdur [isosorbide dinitrate]; latex; and tetracycline.  MEDICATIONS:  Current Outpatient Prescriptions  Medication Sig Dispense Refill  . albuterol (PROVENTIL HFA;VENTOLIN HFA) 108 (90 Base) MCG/ACT inhaler Inhale 2 puffs into the lungs every 6 (six) hours as needed for wheezing or shortness of breath.    Marland Kitchen alendronate (FOSAMAX) 70 MG tablet Take 70 mg by mouth every Wednesday. Take with a full glass of water on an empty stomach.     Marland Kitchen aspirin EC 81 MG EC tablet Take 1 tablet (81 mg total) by mouth daily.    . clopidogrel (PLAVIX) 75 MG tablet Take 75 mg by  mouth daily.    Marland Kitchen doxycycline (VIBRA-TABS) 100 MG tablet Take 100 mg by mouth 2 (two) times daily.     Marland Kitchen ezetimibe (ZETIA) 10 MG tablet TAKE 1 TABLET BY MOUTH  DAILY 90 tablet 2  . furosemide (LASIX) 80 MG tablet Take 0.5 tablets (40 mg total) by mouth daily as needed. 1 tablet in the AM and 1/2 tablet (40 mg) in the early afternoon. 90 tablet 3  .  gabapentin (NEURONTIN) 300 MG capsule Take 1 capsule (300 mg total) by mouth 3 (three) times daily. 90 capsule 6  . glucose blood (ACCU-CHEK GUIDE) test strip Use to test blood sugar 3 times daily 100 each 5  . Insulin Disposable Pump (V-GO 40) KIT USE 1 PER DAY 90 kit 1  . insulin lispro (HUMALOG KWIKPEN) 100 UNIT/ML KiwkPen INJECT 10 UNITS INTO THE SKIN WITH MEALS PLUS SLIDING SCALE (Patient taking differently: Inject 10-12 Units into the skin daily as needed. per SLIDING SCALE) 15 mL 2  . insulin lispro (HUMALOG) 100 UNIT/ML injection Inject 76 units daily with  V-GO pump 70 mL 2  . ipratropium-albuterol (DUONEB) 0.5-2.5 (3) MG/3ML SOLN Take 3 mLs by nebulization 4 (four) times daily. 360 mL 0  . letrozole (FEMARA) 2.5 MG tablet Take 1 tablet (2.5 mg total) by mouth daily. 90 tablet 3  . loratadine (CLARITIN) 10 MG tablet Take 10 mg by mouth daily.    . midodrine (PROAMATINE) 5 MG tablet ONE TABLET BY MOUTH THREE TIMES DAILY WITH MEALS 90 tablet 6  . potassium chloride SA (KLOR-CON M20) 20 MEQ tablet Take 1 tablet (20 mEq total) by mouth daily as needed (when you take lasix). 60 tablet 3  . rosuvastatin (CRESTOR) 40 MG tablet TAKE ONE TABLET BY MOUTH ONCE DAILY (Patient taking differently: TAKE 35m TABLET BY MOUTH ONCE DAILY) 30 tablet 11  . sotalol (BETAPACE) 120 MG tablet TAKE ONE TABLET BY MOUTH EVERY TWELVE HOURS 60 tablet 6  . SYMBICORT 160-4.5 MCG/ACT inhaler INHALE TWO PUFFS BY MOUTH TWICE DAILY 10.2 g 2  . traMADol (ULTRAM-ER) 200 MG 24 hr tablet     . TRULICITY 05.46MFK/8.1EXSOPN Inject 0.75 mg into the skin every Wednesday.      No current facility-administered medications for this visit.     PHYSICAL EXAMINATION: ECOG PERFORMANCE STATUS: 1 - Symptomatic but completely ambulatory  Vitals:   10/04/16 1418  BP: (!) 153/55  Pulse: 78  Resp: 18  Temp: 98.4 F (36.9 C)   Filed Weights   10/04/16 1418  Weight: 194 lb 3.2 oz (88.1 kg)    GENERAL:alert, no distress and  comfortable SKIN: skin color, texture, turgor are normal, no rashes or significant lesions EYES: normal, Conjunctiva are pink and non-injected, sclera clear OROPHARYNX:no exudate, no erythema and lips, buccal mucosa, and tongue normal  NECK: supple, thyroid normal size, non-tender, without nodularity LYMPH:  no palpable lymphadenopathy in the cervical, axillary or inguinal LUNGS: clear to auscultation and percussion with normal breathing effort HEART: regular rate & rhythm and no murmurs and no lower extremity edema ABDOMEN:abdomen soft, non-tender and normal bowel sounds MUSCULOSKELETAL:no cyanosis of digits and no clubbing  NEURO: alert & oriented x 3 with fluent speech, no focal motor/sensory deficits EXTREMITIES: No lower extremity edema  LABORATORY DATA:  I have reviewed the data as listed   Chemistry      Component Value Date/Time   NA 142 07/10/2016 1037   NA 143 06/15/2016 1206   NA 143 04/27/2016 1352  K 4.7 07/10/2016 1037   K 4.1 04/27/2016 1352   CL 108 07/10/2016 1037   CO2 34 (H) 07/10/2016 1037   CO2 29 04/27/2016 1352   BUN 20 07/10/2016 1037   BUN 37 (H) 06/15/2016 1206   BUN 29.5 (H) 04/27/2016 1352   CREATININE 1.17 07/10/2016 1037   CREATININE 1.66 (H) 05/23/2016 1143   CREATININE 1.3 (H) 04/27/2016 1352      Component Value Date/Time   CALCIUM 9.4 07/10/2016 1037   CALCIUM 8.9 04/27/2016 1352   ALKPHOS 53 07/10/2016 1037   ALKPHOS 62 04/27/2016 1352   AST 21 07/10/2016 1037   AST 28 04/27/2016 1352   ALT 19 07/10/2016 1037   ALT 24 04/27/2016 1352   BILITOT 0.6 07/10/2016 1037   BILITOT 0.81 04/27/2016 1352       Lab Results  Component Value Date   WBC 5.2 07/10/2016   HGB 10.4 (L) 07/10/2016   HCT 30.9 (L) 07/10/2016   MCV 92.1 07/10/2016   PLT 129.0 (L) 07/10/2016   NEUTROABS 3.3 07/10/2016    ASSESSMENT & PLAN:  Breast cancer of upper-inner quadrant of right female breast ([place]) Right breast biopsy 12:30 position 12/14/2015: 2  masses, 1.6 cm mass: Invasive ductal carcinoma, grade 2, ER 0%, PR 0%, HER-2 negative, Ki-67 70%; satellite mass 8 mm: IDC grade 2 ER 5%, PR 5%, HER-2 negative, Ki-67 50%; T1cN0 stage IA clinical stage  Right lumpectomy 01/18/2016: Multifocal IDC grade 3, 1.9 cm ER 0%, PR 0%, HER-2 negative, Ki-67 70% and 0.8 cm satellite mass ER 5%, PR 2%, HER-2 negative, Ki-67 50%, high-grade DCIS, margins negative, 0/1 lymph nodes negative T1 cN0 stage IA  DM-2 with CRF: not giving steroids CHF: Cannot give Adriamycin CRF: Will closely monitor Severe peripheral neuropathy: Currently on gabapentin. I renewed her medication today.  Treatment plan: 1. Adjuvant chemotherapy with Taxotere and Cytoxan (3cycles). Adriamycin was not given because of her cardiac problems, Received 3 cycles of TC 02/17/16 to 04/06/16 2. Adjuvant radiation 05/24/16 to 07/26/16 3 followed by adjuvant antiestrogen therapy (because a satellite tumor is weakly ER and PR positive). Started 08/23/2016 ---------------------------------------------------------------------------------- Current treatment: Anti estrogen therapy with letrozole 2.5 mg daily. started 08/23/2016 If she is unable to tolerate antiestrogen therapy then I would immediately discontinue it because she is only weak ER and PR positivity  Letrozole toxicities: Denies any hot flashes or myalgias.  Return to clinic in 6 months for toxicity check and follow-up.   I spent 25 minutes talking to the patient of which more than half was spent in counseling and coordination of care.  No orders of the defined types were placed in this encounter.  The patient has a good understanding of the overall plan. she agrees with it. she will call with any problems that may develop before the next visit here.   Rulon Eisenmenger, MD 10/04/16

## 2016-10-04 NOTE — Assessment & Plan Note (Signed)
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 I renewed her medication today.  Treatment plan: 1. Adjuvant chemotherapy with Taxotere and Cytoxan (3cycles). Adriamycin was not given because of her cardiac problems, Received 3 cycles of TC 02/17/16 to 04/06/16 2. Adjuvant radiation 05/24/16 to 07/26/16 3 followed by adjuvant antiestrogen therapy (because a satellite tumor is weakly ER and PR positive). Started 08/23/2016 ---------------------------------------------------------------------------------- Current treatment: Anti estrogen therapy with letrozole 2.5 mg daily. started 08/23/2016 If she is unable to tolerate antiestrogen therapy then I would immediately discontinue it because she is only weak ER and PR positivity  Letrozole toxicities:  Return to clinic in 6 months for toxicity check and follow-up.

## 2016-10-04 NOTE — Progress Notes (Signed)
Consent documentation  Study code: [id number]  Met with patient and her granddaughter following the patient's provider visit on 10/04/16 at 1:45. Provided an overview of the "Pharmacogenetic analysis of toxicities related to administration of taxanes in breast cancer patients" study.  Consent form was reviewed with the patient (reviewed the study purpose, patient's role, possible side effects, cost, risk, information protection) All of the patient's questions were answered and she agreed to participate in the study. A signed copy of the consent form was given to the patient. All eligibility criteria have been met and patient has been enrolled in the study.  Study sample was collected via buccal swab after consent was obtained. Patient was informed that the sample would be sent to the lab for processing after samples were collected from 25 patients and that it would take approximately 2 weeks for the lab to process that sample.   Met with the patient for approximately 15 minutes.  Darl Pikes, PharmD, [place] Clinical Pharmacist- Oncology Pharmacy Resident

## 2016-10-08 ENCOUNTER — Other Ambulatory Visit (INDEPENDENT_AMBULATORY_CARE_PROVIDER_SITE_OTHER): Payer: Medicare Other

## 2016-10-08 ENCOUNTER — Other Ambulatory Visit: Payer: Self-pay

## 2016-10-08 DIAGNOSIS — N183 Chronic kidney disease, stage 3 unspecified: Secondary | ICD-10-CM

## 2016-10-08 DIAGNOSIS — E1122 Type 2 diabetes mellitus with diabetic chronic kidney disease: Secondary | ICD-10-CM | POA: Diagnosis not present

## 2016-10-08 DIAGNOSIS — E782 Mixed hyperlipidemia: Secondary | ICD-10-CM

## 2016-10-08 DIAGNOSIS — Z794 Long term (current) use of insulin: Secondary | ICD-10-CM | POA: Diagnosis not present

## 2016-10-08 DIAGNOSIS — E1165 Type 2 diabetes mellitus with hyperglycemia: Secondary | ICD-10-CM

## 2016-10-08 LAB — BASIC METABOLIC PANEL
BUN: 30 mg/dL — ABNORMAL HIGH (ref 6–23)
CO2: 34 mEq/L — ABNORMAL HIGH (ref 19–32)
Calcium: 9.8 mg/dL (ref 8.4–10.5)
Chloride: 100 mEq/L (ref 96–112)
Creatinine, Ser: 1.44 mg/dL — ABNORMAL HIGH (ref 0.40–1.20)
GFR: 38.13 mL/min — ABNORMAL LOW (ref >60.00)
Glucose, Bld: 73 mg/dL (ref 70–99)
Potassium: 4.9 mEq/L (ref 3.5–5.1)
Sodium: 138 mEq/L (ref 135–145)

## 2016-10-08 LAB — LDL CHOLESTEROL, DIRECT: Direct LDL: 53 mg/dL

## 2016-10-09 ENCOUNTER — Other Ambulatory Visit: Payer: Medicare Other

## 2016-10-09 ENCOUNTER — Ambulatory Visit (HOSPITAL_COMMUNITY)
Admission: RE | Admit: 2016-10-09 | Discharge: 2016-10-09 | Disposition: A | Discharge status: Home / Self Care | Payer: Medicare Other | Source: Ambulatory Visit | Attending: Internal Medicine | Admitting: Internal Medicine

## 2016-10-09 DIAGNOSIS — I779 Disorder of arteries and arterioles, unspecified: Secondary | ICD-10-CM | POA: Diagnosis not present

## 2016-10-09 DIAGNOSIS — R55 Syncope and collapse: Secondary | ICD-10-CM | POA: Insufficient documentation

## 2016-10-09 DIAGNOSIS — I739 Peripheral vascular disease, unspecified: Secondary | ICD-10-CM

## 2016-10-09 DIAGNOSIS — I6523 Occlusion and stenosis of bilateral carotid arteries: Secondary | ICD-10-CM

## 2016-10-09 LAB — HEMOGLOBIN A1C: Hgb A1c MFr Bld: 6.9 % — ABNORMAL HIGH (ref 4.6–6.5)

## 2016-10-11 ENCOUNTER — Ambulatory Visit (INDEPENDENT_AMBULATORY_CARE_PROVIDER_SITE_OTHER): Payer: Medicare Other | Admitting: Endocrinology

## 2016-10-11 ENCOUNTER — Encounter: Payer: Self-pay | Admitting: Endocrinology

## 2016-10-11 VITALS — BP 130/60 | HR 70 | Ht 65.0 in | Wt 199.8 lb

## 2016-10-11 DIAGNOSIS — Z794 Long term (current) use of insulin: Secondary | ICD-10-CM

## 2016-10-11 DIAGNOSIS — E1165 Type 2 diabetes mellitus with hyperglycemia: Secondary | ICD-10-CM

## 2016-10-11 NOTE — Progress Notes (Signed)
Patient ID: Hannah [name], female   DOB: 1945/11/22, 71 y.o.   MRN: [id number]           Reason for Appointment: Follow-up for Type 2 Diabetes   History of Present Illness:          Date of diagnosis of type 2 diabetes mellitus: Late 1980s        Background history:  She was previously treated with metformin prior to starting insulin She thinks she has been on insulin for at least 10 years and mostly taking Lantus and other types of insulin with it Her metformin was stopped when she started insulin and also has had renal dysfunction Previously her A1c had been consistently over 8% and occasionally higher.  Recent history:   INSULIN regimen is described as:  V-go 40 basal, mealtime coverage 8 u acB and 8-10 acS  She has been on the V-go pump since 03/2015  Most recent A1c is 6.9, previously 6.4 although she tends to have anemia   Current blood sugar patterns and problems identified:    She recently appears to be getting hypoglycemia on waking up again  However she thinks she is not symptomatic even with blood sugars in the 40s  Also has had a few low readings in the afternoons and evenings also  She did not report low readings  This is even despite her not taking Trulicity every week consistently because of forgetting the day of the week  She is monitoring blood sugars somewhat sporadically although has some readings at different times  May be getting some high readings because of not bolusing, yesterday she forgot to bolus all day including when she was eating out and glucose was 267  Has occasional issues with the pump not apparently delivering the bolus when she is clicking  May occasionally forget to bolus when she is eating out  Oral hypoglycemic drugs the patient is taking are: None On Trulicity 6.22 mg weekly  Side effects from medications have been: None  Compliance with the medical regimen: Fair  Glucose monitoring:  done about 3 times a day          Glucometer:  Accu-Chek [employer] Blood Glucose readings by download for the last 4 weeks:  Mean values apply above for all meters except median for One Touch  PRE-MEAL Fasting Lunch Dinner Bedtime Overall  Glucose range:  41-215       Mean/median: 85  119  125  122  113      Self-care: The diet that the patient has been following is: tries to limit fats and carbs.  Eating out periodically She uses diet green tea or diet drinks and no sugar in drinks    Lunch 1-2 pm Dinner 6-7 PM; Hs snack   Typical meal intake: Breakfast is eggs usually             Dietician visit, most recent: 5/16 And in 7/16 with oncology dietitian                Exercise:  none   Weight history:  Wt Readings from Last 3 Encounters:  10/11/16 199 lb 12.8 oz (90.6 kg)  10/04/16 194 lb 3.2 oz (88.1 kg)  10/01/16 193 lb 1.9 oz (87.6 kg)    Glycemic control:      Lab Results  Component Value Date   HGBA1C 6.9 (H) 10/09/2016   HGBA1C 6.4 05/08/2016   HGBA1C 6.5 01/05/2016   Lab Results  Component Value Date  MICROALBUR 24.3 (H) 09/19/2015   LDLCALC 82 07/10/2016   CREATININE 1.44 (H) 10/08/2016       Allergies as of 10/11/2016      Reactions   Amoxicillin Rash   Has patient had a PCN reaction causing immediate rash, facial/tongue/throat swelling, SOB or lightheadedness with hypotension: No Has patient had a PCN reaction causing severe rash involving mucus membranes or skin necrosis: Yes Has patient had a PCN reaction that required hospitalization No Has patient had a PCN reaction occurring within the last 10 years: No If all of the above answers are "NO", then may proceed with Cephalosporin use.   Imdur [isosorbide Dinitrate] Other (See Comments)   Headache/severe hypotension/Syncope   Latex Itching   Tetracycline Rash      Medication List       Accurate as of 10/11/16  2:17 PM. Always use your most recent med list.          albuterol 108 (90 Base) MCG/ACT inhaler Commonly known as:   PROVENTIL HFA;VENTOLIN HFA Inhale 2 puffs into the lungs every 6 (six) hours as needed for wheezing or shortness of breath.   alendronate 70 MG tablet Commonly known as:  FOSAMAX Take 70 mg by mouth every Wednesday. Take with a full glass of water on an empty stomach.   aspirin 81 MG EC tablet Take 1 tablet (81 mg total) by mouth daily.   clopidogrel 75 MG tablet Commonly known as:  PLAVIX Take 75 mg by mouth daily.   doxycycline 100 MG tablet Commonly known as:  VIBRA-TABS Take 100 mg by mouth 2 (two) times daily.   ezetimibe 10 MG tablet Commonly known as:  ZETIA TAKE 1 TABLET BY MOUTH  DAILY   furosemide 80 MG tablet Commonly known as:  LASIX Take 0.5 tablets (40 mg total) by mouth daily as needed. 1 tablet in the AM and 1/2 tablet (40 mg) in the early afternoon.   gabapentin 300 MG capsule Commonly known as:  NEURONTIN Take 1 capsule (300 mg total) by mouth 3 (three) times daily.   glucose blood test strip Commonly known as:  ACCU-CHEK GUIDE Use to test blood sugar 3 times daily   insulin lispro 100 UNIT/ML KiwkPen Commonly known as:  HUMALOG KWIKPEN INJECT 10 UNITS INTO THE SKIN WITH MEALS PLUS SLIDING SCALE   insulin lispro 100 UNIT/ML injection Commonly known as:  HUMALOG Inject 76 units daily with  V-GO pump   ipratropium-albuterol 0.5-2.5 (3) MG/3ML Soln Commonly known as:  DUONEB Take 3 mLs by nebulization 4 (four) times daily.   letrozole 2.5 MG tablet Commonly known as:  FEMARA Take 1 tablet (2.5 mg total) by mouth daily.   loratadine 10 MG tablet Commonly known as:  CLARITIN Take 10 mg by mouth daily.   midodrine 5 MG tablet Commonly known as:  PROAMATINE ONE TABLET BY MOUTH THREE TIMES DAILY WITH MEALS   potassium chloride SA 20 MEQ tablet Commonly known as:  KLOR-CON M20 Take 1 tablet (20 mEq total) by mouth daily as needed (when you take lasix).   rosuvastatin 40 MG tablet Commonly known as:  CRESTOR TAKE ONE TABLET BY MOUTH ONCE DAILY    sotalol 120 MG tablet Commonly known as:  BETAPACE TAKE ONE TABLET BY MOUTH EVERY TWELVE HOURS   SYMBICORT 160-4.5 MCG/ACT inhaler Generic drug:  budesonide-formoterol INHALE TWO PUFFS BY MOUTH TWICE DAILY   traMADol 200 MG 24 hr tablet Commonly known as:  ULTRAM-ER   TRULICITY 5.46 TK/3.5WS Sopn Generic  drug:  Dulaglutide Inject 0.75 mg into the skin every Wednesday.   V-GO 40 Kit USE 1 PER DAY       Allergies:  Allergies  Allergen Reactions  . Amoxicillin Rash    Has patient had a PCN reaction causing immediate rash, facial/tongue/throat swelling, SOB or lightheadedness with hypotension: No Has patient had a PCN reaction causing severe rash involving mucus membranes or skin necrosis: Yes Has patient had a PCN reaction that required hospitalization No Has patient had a PCN reaction occurring within the last 10 years: No If all of the above answers are "NO", then may proceed with Cephalosporin use.   . Imdur [Isosorbide Dinitrate] Other (See Comments)    Headache/severe hypotension/Syncope  . Latex Itching  . Tetracycline Rash    Past Medical History:  Diagnosis Date  . Abnormally small mouth   . Anemia   . Bilateral carotid artery stenosis    Bilateral ICA 40-59%/  >50% LECA  . Breast cancer ([place])   . Breast cancer of upper-inner quadrant of right female breast ([place]) 12/16/2015  . CAD (coronary artery disease) cardiologist-  dr Aundra Dubin   Remote CABG in 1998 with redo in 2009  . CHF (congestive heart failure) ([facility])    EF is low normal at 50 to 55% per echo in Jan. 2011  . GERD (gastroesophageal reflux disease)   . Hearing loss of right ear   . History of non-ST elevation myocardial infarction (NSTEMI)    Sept 2014--  thought to be type II HTN w/ LHC without infarct related artery and patent grafts  . Hyperlipidemia   . Hypertension    Resolved.  . Iron deficiency anemia   . Moderate persistent asthma    pulmologist-  Dr. Malvin Johns  . Nonischemic cardiomyopathy  ([place])   . PAF (paroxysmal atrial fibrillation) ([place])   . Psoriasis    right leg  . Renal calculus, right   . Renal insufficiency, mild   . S/P AVR    prosthesis valve placement 2009 at same time re-do CABG  . Stroke [facility])    residual rt hearing loss  . Type 2 diabetes mellitus ([place])    monitored by dr Dwyane Dee    Past Surgical History:  Procedure Laterality Date  . AORTIC VALVE REPLACEMENT  [facility]   [facility] same [facility] is [facility]  . BREAST LUMPECTOMY WITH RADIOACTIVE SEED AND SENTINEL LYMPH NODE BIOPSY Right 01/18/2016   Procedure: RIGHT BREAST LUMPECTOMY WITH RADIOACTIVE SEED AND SENTINEL LYMPH NODE BIOPSY;  Surgeon: DAlphonsa Overall MD;  Location[place] [facility]  Service: General;  Laterality: Right;  . CARDIAC CATHETERIZATION  03/23/2008   Pre-redo CABG: L main OK, LAD (T), CFX (T), OM1 99%, RCA (T), LIMA-LAD OK, SVG-OM(?3) OK w/ little florw to OM2, SVG-RCA OK. EF NL  . CARPAL TUNNEL RELEASE    . COLONOSCOPY    . CORONARY ARTERY BYPASS GRAFT  1998 &  re-do 2009   Had LIMA to DX/LAD, [facility] 2 marginal [facility]; SVG to 3rd OM and PD at time of redo  . CYSTOSCOPY W/ URETERAL STENT PLACEMENT Right 12/20/2014   Procedure: CYSTOSCOPY WITH RETROGRADE PYELOGRAM/URETERAL STENT PLACEMENT;  Surgeon: PCleon Gustin MD;  [facility]  Service: Urology;  Laterality: Right;  . EYE SURGERY Bilateral    cataracts  . HOLMIUM LASER APPLICATION Right [id number]/9562  Procedure:  HOLMIUM LASER LITHOTRIPSY;  Surgeon: PCleon Gustin MD;  Location: [place];  Service: Urology;  Laterality: Right;  . LEFT HEART CATHETERIZATION WITH CORONARY/GRAFT ANGIOGRAM N/A 02/23/2013   Procedure: LEFT HEART CATHETERIZATION WITH Beatrix Fetters;  Surgeon: Blane Ohara, MD;  Location: [facility] CATH LAB;  Service: Cardiovascular;  Laterality: N/A;  . PORTACATH PLACEMENT Left 01/18/2016   Procedure: INSERTION PORT-A-CATH;   Surgeon: Alphonsa Overall, MD;  Location: [place];  Service: General;  Laterality: Left;  . TONSILLECTOMY    . TRANSTHORACIC ECHOCARDIOGRAM  02-24-2013      mild LVH,  ef 50-55%/  AV bioprosthesis was present with very mild stenosis and no regurg., mean grandient 83mHg, peak grandient 292mg /  mild MR/  mild LAE and RAE/  moderate TR  . TUBAL LIGATION      Family History  Problem Relation Age of Onset  . Heart disease Father   . Heart failure Father   . Diabetes Maternal Grandmother   . Heart disease Maternal Grandmother   . Diabetes Son   . Healthy Brother     #1  . Heart attack Brother     #2  . Heart disease Brother     #2    Social History:  reports that she has never smoked. She has never used smokeless tobacco. She reports that she does not drink alcohol or use drugs.    Review of Systems    Lipid history: On 40 mg Crestor along with Zetia  LDL over 70, followed by cardiologist    Lab Results  Component Value Date   CHOL 137 07/10/2016   HDL 33.70 (L) 07/10/2016   LDLCALC 82 07/10/2016   LDLDIRECT 53.0 10/08/2016   TRIG 107.0 07/10/2016   CHOLHDL 4 07/10/2016           Eyes:   Most recent eye exam was In 2017 and was not told to have any retinopathy  Hypertension: She apparently had low blood pressure during hospitalization and is taking midodrine, followed by cardiologist now Only on sotalol As beta blocker   She is having less edema, followed by cardiologist, takes Lasix as needed only  She is followed by nephrologist for mild renal dysfunction, etiology unclear  Diabetic foot exam in 5/16: She has complete loss of monofilament sensation in her distal feet with only mild sensitivity present on the right first and second toes   Physical Examination:  BP 130/60   Pulse 70   Ht 5' 5"  (1.651 m)   Wt 199 lb 12.8 oz (90.6 kg)   SpO2 95%   BMI 33.25 kg/m    ASSESSMENT:  Diabetes type 2, uncontrolled   See history of present illness for detailed  discussion of  current management, blood sugar patterns and problems identified  She is starting get hypoglycemia which is mostly fasting but occasionally before lunch and supper indicating excess of basal insulin A1c is higher than expected Not clear why she is requiring less insulin, has lost only a little weight She is not checking blood sugars adequately currently especially when blood sugars are fluctuating and tending to be low Her diet has been variable, eating mostly a small lunch   PLAN:   She was changed to the 30 unit basal on the pump  Most likely she can continue the same amount of boluses but try to check more readings after meals to confirm that she is taking adequate amounts  She may need to take higher amounts of boluses, 2-4 units when eating out especially when her basal  insulin is reduced  She will take 2 units to cover her afternoon meal which is currently fruit and cottage cheese  Call if blood sugars are not consistently controlled  More blood sugars in the next 3 days while adjusting the dose  She will call the V-go company if she has any issues with the pump  Will need follow-up in 6 weeks  There are no Patient Instructions on file for this visit.   Counseling time on subjects discussed above is over 50% of today's 25 minute visit   Calin Ellery 10/11/2016, 2:17 PM   Note: This office note was prepared with Estate agent. Any transcriptional errors that result from this process are unintentional.

## 2016-10-12 ENCOUNTER — Ambulatory Visit: Payer: Medicare Other | Admitting: Endocrinology

## 2016-10-15 ENCOUNTER — Other Ambulatory Visit: Payer: Self-pay | Admitting: Cardiology

## 2016-10-23 ENCOUNTER — Ambulatory Visit (INDEPENDENT_AMBULATORY_CARE_PROVIDER_SITE_OTHER): Payer: Medicare Other | Admitting: Neurology

## 2016-10-23 ENCOUNTER — Encounter: Payer: Self-pay | Admitting: Neurology

## 2016-10-23 VITALS — BP 136/60 | HR 95 | Ht 65.0 in | Wt 195.0 lb

## 2016-10-23 DIAGNOSIS — I951 Orthostatic hypotension: Secondary | ICD-10-CM | POA: Insufficient documentation

## 2016-10-23 NOTE — Patient Instructions (Signed)
1. Continue all your medications and Cardiology follow-up 2. Follow-up in 6 months, call for any changes in symptoms

## 2016-10-23 NOTE — Progress Notes (Signed)
NEUROLOGY CONSULTATION NOTE  Hannah [name] MRN: [id number] DOB: 22-Oct-1945  Referring provider: Mackie Pai, PA-C Primary care provider: Mackie Pai, PA-C  Reason for consult:  syncope  Thank you for your kind referral of Hannah [name] for consultation of the above symptoms. Although her history is well known to you, please allow me to reiterate it for the purpose of our medical record. Records and images were personally reviewed where available.  HISTORY OF PRESENT ILLNESS: This is a very pleasant 71 year old right-handed woman with a history of CAD s/p CABG x 2, diabetes on disposable insulin pump, paroxysmal atrial fibrillation, s/p aortic valve replacement, hypertension, and breast cancer s/p chemotherapy and radiation, who had an episode of loss of consciousness while at her radiation appointment last 06/29/2016. She was sitting in a wheelchair when she became unresponsive, eyes were open, glassy, followed by bilateral upper extremity shaking and vomiting after. She was confused for a few minutes after and was amnestic of the event. Vital signs were stable, blood sugar was 206. She was brought to the ER where she had another episode of unresponsive with no associated shaking, followed by brief confusion. While she was admitted, she had an event coming back from the bathroom when she started looking down and became unresponsive with some clonic movement of one hand for 1 minute. She had an EEG which showed mild diffuse slowing with no epileptiform discharges. I personally reviewed MRI brain with and without contrast, there was significant movement artifact, but there were no acute changes seen, no abnormal enhancement noted. She was initially started on Vimpat, however during her stay she was found to be orthostatic with decline in SBP from 130s to 60s upon standing, there was also note of documented arrhythmias at that time. Vimpat was discontinued. She was advised follow-up with  outpatient neurology to continue to monitor should symptoms continue despite treatment of orthostasis and arrhythmias. She has seen Cardiology and continues on midodrine, Sotalol, and prn Lasix. She feels the medication is helping, she reports her lowest BP at home is around 110/45 to 50. She has had only one more syncopal episode that occurred the week after hospital discharge in January . She was standing in the bedroom then her husband saw a change in her face and sat her down, she briefly passed out. She reports presyncopal episodes continue, she gets lightheaded while standing then shaky in her legs, sitting down with resolution of symptoms. Every once in a while the quiver occurs in her arms, resolving when she sits down. She had a fall 3 weeks ago and does not think she passed out, she feels it was due to her neuropathy, her legs did not move when she tried to turn and fell down. No significant injuries.  She reports a history of a "mini-stroke" 7-8 years ago that affected her left side and caused hearing loss on the right. She was told the stroke was in the back of the brain. Around that time, she also started having episodes of passing out but without any shaking. After a month or so, they stopped. She was evaluated by neurologist Dr. Justine Null and recall having an ambulatory EEG done which "did not find anything." She has noticed occasional gaps in time occurring around twice a month. About a couple of times a month, she would smell something other people don't, reports they are different smells each time. She denies any rising epigastric sensation, myoclonic jerks. 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 She feels her strength has been low since she finished radiation mid-February. She denies any diplopia, dysarthria, neck pain, bowel/bladder dysfunction. She feels something gets stuck in her throat when swallowing. She has chronic back pain. She had a normal birth and early development.  There is no  history of febrile convulsions, CNS infections such as meningitis/encephalitis, significant traumatic brain injury, neurosurgical procedures, or family history of seizures.  PAST MEDICAL HISTORY: Past Medical History:  Diagnosis Date  . Abnormally small mouth   . Anemia   . Bilateral carotid artery stenosis    Bilateral ICA 40-59%/  >50% LECA  . Breast cancer ([place])   . Breast cancer of upper-inner quadrant of right female breast ([place]) 12/16/2015  . CAD (coronary artery disease) cardiologist-  dr Aundra Dubin   Remote CABG in 1998 with redo in 2009  . CHF (congestive heart failure) (H[facility])    EF is low normal at 50 to 55% per echo in Jan. 2011  . GERD (gastroesophageal reflux disease)   . Hearing loss of right ear   . History of non-ST elevation myocardial infarction (NSTEMI)    Sept 2014--  thought to be type II HTN w/ LHC without infarct related artery and patent grafts  . Hyperlipidemia   . Hypertension    Resolved.  . Iron deficiency anemia   . Moderate persistent asthma    pulmologist-  Dr. Malvin Johns  . Nonischemic cardiomyopathy ([place])   . PAF (paroxysmal atrial fibrillation) ([place])   . Psoriasis    right leg  . Renal calculus, right   . Renal insufficiency, mild   . S/P AVR    prosthesis valve placement 2009 at same time re-do CABG  . 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 Type 2 diabetes mellitus ([place])    monitored by dr Dwyane Dee    PAST SURGICAL HISTORY: Past Surgical History:  Procedure Laterality Date  . AORTIC VALVE [facility]ACEMENT  [facility]   [facility] is [facility]  . BREAST LUMPECTOMY WITH RADIOACTIVE SEED AND SENTINEL LYMPH NODE BIOPSY Right 01/18/2016   Procedure: RIGHT BREAST LUMPECTOMY WITH RADIOACTIVE SEED AND SENTINEL LYMPH NODE BIOPSY;  Surgeon: DAlphonsa Overall MD;  [facility]  Service: General;  Laterality: Right;  . CARDIAC CATHETERIZATION  03/23/2008   Pre-redo CABG: L main OK, LAD (T), CFX (T), OM1 99%, RCA (T), LIMA-LAD  OK, SVG-OM(?3) OK w/ little florw to OM2, SVG-RCA OK. EF NL  . CARPAL TUNNEL RELEASE    . COLONOSCOPY    . CORONARY ARTERY BYPASS GRAFT  1998 &  re-do 2009   Had LIMA to DX/LAD, [facility] 2 [facility] SVG to 3rd OM and PD at time of redo  . CYSTOSCOPY W/ URETERAL STENT PLACEMENT Right 12/20/2014   Procedure: CYSTOSCOPY WITH RETROGRADE PYELOGRAM/URETERAL STENT PLACEMENT;  Surgeon: PCleon Gustin MD;  [facility]  Service: Urology;  Laterality: Right;  . EYE SURGERY Bilateral    cataracts  . HOLMIUM LASER APPLICATION Right 71/74/0814  Procedure:  HOLMIUM LASER LITHOTRIPSY;  Surgeon: PCleon Gustin MD;  [facility]  Service: Urology;  Laterality: Right;  . LEFT HEART CATHETERIZATION WITH CORONARY/GRAFT ANGIOGRAM N/A 02/23/2013   Procedure: LEFT HEART CATHETERIZATION WITH CBeatrix Fetters  Surgeon: MBlane Ohara MD;  [facility];  Service: Cardiovascular;  Laterality: N/A;  . PORTACATH PLACEMENT Left 01/18/2016   Procedure: INSERTION  PORT-A-CATH;  Surgeon: Alphonsa Overall, MD;  Location: [place];  Service: General;  Laterality: Left;  . TONSILLECTOMY    . TRANSTHORACIC ECHOCARDIOGRAM  02-24-2013      mild LVH,  ef 50-55%/  AV bioprosthesis was present with very mild stenosis and no regurg., mean grandient 32mHg, peak grandient 250mg /  mild MR/  mild LAE and RAE/  moderate TR  . TUBAL LIGATION      MEDICATIONS: Current Outpatient Prescriptions on File Prior to Visit  Medication Sig Dispense Refill  . albuterol (PROVENTIL HFA;VENTOLIN HFA) 108 (90 Base) MCG/ACT inhaler Inhale 2 puffs into the lungs every 6 (six) hours as needed for wheezing or shortness of breath.    . Marland Kitchenlendronate (FOSAMAX) 70 MG tablet Take 70 mg by mouth every Wednesday. Take with a full glass of water on an empty stomach.     . Marland Kitchenspirin EC 81 MG EC tablet Take 1 tablet (81 mg total) by mouth daily.    . clopidogrel  (PLAVIX) 75 MG tablet Take 75 mg by mouth daily.    . Marland Kitchenoxycycline (VIBRA-TABS) 100 MG tablet Take 100 mg by mouth 2 (two) times daily.     . Marland Kitchenzetimibe (ZETIA) 10 MG tablet TAKE 1 TABLET BY MOUTH  DAILY 90 tablet 2  . gabapentin (NEURONTIN) 300 MG capsule Take 1 capsule (300 mg total) by mouth 3 (three) times daily. 90 capsule 6  . glucose blood (ACCU-CHEK GUIDE) test strip Use to test blood sugar 3 times daily 100 each 5  . Insulin Disposable Pump (V-GO 40) KIT USE 1 PER DAY 90 kit 1  . insulin lispro (HUMALOG KWIKPEN) 100 UNIT/ML KiwkPen INJECT 10 UNITS INTO THE SKIN WITH MEALS PLUS SLIDING SCALE 15 mL 2  . insulin lispro (HUMALOG) 100 UNIT/ML injection Inject 76 units daily with  V-GO pump 70 mL 2  . letrozole (FEMARA) 2.5 MG tablet Take 1 tablet (2.5 mg total) by mouth daily. 90 tablet 3  . loratadine (CLARITIN) 10 MG tablet Take 10 mg by mouth daily.    . midodrine (PROAMATINE) 5 MG tablet ONE TABLET BY MOUTH THREE TIMES DAILY WITH MEALS 90 tablet 6  . rosuvastatin (CRESTOR) 40 MG tablet TAKE ONE TABLET BY MOUTH DAILY 90 tablet 3  . sotalol (BETAPACE) 120 MG tablet TAKE ONE TABLET BY MOUTH EVERY TWELVE HOURS 60 tablet 6  . SYMBICORT 160-4.5 MCG/ACT inhaler INHALE TWO PUFFS BY MOUTH TWICE DAILY 10.2 g 2  . traMADol (ULTRAM-ER) 200 MG 24 hr tablet     . TRULICITY 0.3.15GQM/0.8QPOPN Inject 0.75 mg into the skin every Wednesday.     . furosemide (LASIX) 80 MG tablet Take 0.5 tablets (40 mg total) by mouth daily as needed. 1 tablet in the AM and 1/2 tablet (40 mg) in the early afternoon. 90 tablet 3  . ipratropium-albuterol (DUONEB) 0.5-2.5 (3) MG/3ML SOLN Take 3 mLs by nebulization 4 (four) times daily. 360 mL 0  . potassium chloride SA (KLOR-CON M20) 20 MEQ tablet Take 1 tablet (20 mEq total) by mouth daily as needed (when you take lasix). 60 tablet 3   No current facility-administered medications on file prior to visit.     ALLERGIES: Allergies  Allergen Reactions  . Amoxicillin Rash     Has patient had a PCN reaction causing immediate rash, facial/tongue/throat swelling, SOB or lightheadedness with hypotension: No Has patient had a PCN reaction causing severe rash involving mucus membranes or skin necrosis: Yes Has patient had a PCN reaction  that required hospitalization No Has patient had a PCN reaction occurring within the last 10 years: No If all of the above answers are "NO", then may proceed with Cephalosporin use.   . Imdur [Isosorbide Dinitrate] Other (See Comments)    Headache/severe hypotension/Syncope  . Latex Itching  . Tetracycline Rash    FAMILY HISTORY: Family History  Problem Relation Age of Onset  . Heart disease Father   . Heart failure Father   . Diabetes Maternal Grandmother   . Heart disease Maternal Grandmother   . Diabetes Son   . Healthy Brother        #1  . Heart attack Brother        #2  . Heart disease Brother        #2    SOCIAL HISTORY: Social History   Social History  . Marital status: Married    Spouse name: N/A  . Number of children: 2  . Years of education: N/A   Occupational History  . retired    Social History Main Topics  . Smoking status: Never Smoker  . Smokeless tobacco: Never Used  . Alcohol use No  . Drug use: No  . Sexual activity: Yes    Birth control/ protection: None   Other Topics Concern  . Not on file   Social History Narrative  . No narrative on file    REVIEW OF SYSTEMS: Constitutional: No fevers, chills, or sweats, no generalized fatigue, change in appetite Eyes: No visual changes, double vision, eye pain Ear, nose and throat: No hearing loss, ear pain, nasal congestion, sore throat Cardiovascular: No chest pain, palpitations Respiratory:  No shortness of breath at rest or with exertion, wheezes GastrointestinaI: No nausea, vomiting, diarrhea, abdominal pain, fecal incontinence Genitourinary:  No dysuria, urinary retention or frequency Musculoskeletal:  No neck pain, +back  pain Integumentary: No rash, pruritus, skin lesions Neurological: as above Psychiatric: No depression, insomnia, anxiety Endocrine: No palpitations, fatigue, diaphoresis, mood swings, change in appetite, change in weight, increased thirst Hematologic/Lymphatic:  No anemia, purpura, petechiae. Allergic/Immunologic: no itchy/runny eyes, nasal congestion, recent allergic reactions, rashes  PHYSICAL EXAM: Vitals:   10/23/16 1016  BP: 136/60  Pulse: 95   General: No acute distress Head:  Normocephalic/atraumatic Eyes: Fundoscopic exam shows bilateral sharp discs, no vessel changes, exudates, or hemorrhages Neck: supple, no paraspinal tenderness, full range of motion Back: No paraspinal tenderness Heart: regular rate and rhythm Lungs: Clear to auscultation bilaterally. Vascular: No carotid bruits. Skin/Extremities: No rash, no edema Neurological Exam: Mental status: alert and oriented to person, place, and time, no dysarthria or aphasia, Fund of knowledge is appropriate.  Recent and remote memory are intact.  Attention and concentration are normal.    Able to name objects and repeat phrases. Cranial nerves: CN I: not tested CN II: pupils equal, round and reactive to light, visual fields intact, fundi unremarkable. CN III, IV, VI:  full range of motion, no nystagmus, no ptosis CN V: facial sensation intact CN VII: upper and lower face symmetric CN VIII: hearing intact to finger rub on left, decreased on right CN IX, X: gag intact, uvula midline CN XI: sternocleidomastoid and trapezius muscles intact CN XII: tongue midline Bulk & Tone: normal, no fasciculations. Motor: 5/5 throughout with no pronator drift. Sensation: intact to light touch, cold, pin on both UE, decreased pin and cold to mid-calves bilaterally, decreased vibration to right knee, left hip. No extinction to double simultaneous stimulation.  Romberg test slight sway Deep Tendon  Reflexes: unable to elicit reflexes  throughout, no ankle clonus Plantar responses: downgoing bilaterally Cerebellar: no incoordination on finger to nose testing Gait: narrow-based, no ataxia, able to tandem walk with mild difficulty Tremor: none  IMPRESSION: This is a very pleasant 71 year old right-handed woman with a history of CAD s/p CABG x 2, diabetes on disposable insulin pump, paroxysmal atrial fibrillation, s/p aortic valve replacement, hypertension, and breast cancer s/p chemotherapy and radiation, who had an episode of loss of consciousness while at her radiation appointment last 06/29/2016. She reports a history of "mini-stroke" 7-8 years ago and had episodes of loss of consciousness for 1-2 months that then stopped until last January. She had several episodes of unresponsiveness in the hospital and was found to have orthostatic hypotension and atrial arrhythmia. Her MRI brain was unremarkable, EEG showed mild diffuse slowing. We discussed that symptoms at this time are consistent with syncope due to orthostatic hypotension, she feels the midodrine and sotalol are helping. No further syncopal episodes since January 2018, she continues to have presyncopal episodes with slight shaking (suggestive of orthostatic tremor).  No indication to start antiepileptic medication at this time. We discussed that if symptoms change in character, we will consider doing another ambulatory EEG to further classify her symptoms. She will follow-up in 6 months and knows to call for any changes.   Thank you for allowing me to participate in the care of this patient. Please do not hesitate to call for any questions or concerns.   Ellouise Newer, M.D.  CC: Mackie Pai, PA-C, Dr. Lovena Le, Dr. Dwyane Dee

## 2016-10-24 ENCOUNTER — Encounter: Payer: Self-pay | Admitting: Endocrinology

## 2016-10-24 LAB — HM DIABETES EYE EXAM

## 2016-10-25 ENCOUNTER — Telehealth: Payer: Self-pay | Admitting: Endocrinology

## 2016-10-25 ENCOUNTER — Other Ambulatory Visit: Payer: Self-pay

## 2016-10-25 MED ORDER — V-GO 30 KIT: PACK | 1 refills | Status: DC

## 2016-10-25 NOTE — Telephone Encounter (Signed)
Pt called in and said that the last time she was in the office, Dr. Dwyane Dee said to switch to the VGo 30 instead of the 40, but she said that they haven't received that prescription yet.  She was wondering if we could go ahead and send that in.

## 2016-10-25 NOTE — Telephone Encounter (Signed)
Called patient and let her know that I sent her Rx in for the V-Go 30 kit to [facility].

## 2016-11-07 ENCOUNTER — Other Ambulatory Visit: Payer: Self-pay

## 2016-11-07 DIAGNOSIS — E860 Dehydration: Secondary | ICD-10-CM

## 2016-11-07 MED ORDER — TRULICITY 0.75 MG/0.5ML ~~LOC~~ SOAJ: 0.7500 mg | SUBCUTANEOUS | 2 refills | Status: DC

## 2016-11-09 ENCOUNTER — Other Ambulatory Visit: Payer: Self-pay

## 2016-11-14 ENCOUNTER — Other Ambulatory Visit: Payer: Self-pay | Admitting: Hematology and Oncology

## 2016-11-14 DIAGNOSIS — C50211 Malignant neoplasm of upper-inner quadrant of right female breast: Secondary | ICD-10-CM

## 2016-11-21 ENCOUNTER — Other Ambulatory Visit: Payer: Self-pay

## 2016-11-21 DIAGNOSIS — E860 Dehydration: Secondary | ICD-10-CM

## 2016-11-21 MED ORDER — INSULIN LISPRO 100 UNIT/ML ~~LOC~~ SOLN: SUBCUTANEOUS | 3 refills | Status: DC

## 2016-11-22 ENCOUNTER — Other Ambulatory Visit: Payer: Medicare Other

## 2016-11-26 ENCOUNTER — Ambulatory Visit: Payer: Medicare Other | Admitting: Endocrinology

## 2016-11-28 ENCOUNTER — Encounter: Payer: Self-pay | Admitting: Oncology

## 2016-11-29 ENCOUNTER — Encounter: Payer: Self-pay | Admitting: Radiation Oncology

## 2016-11-29 ENCOUNTER — Ambulatory Visit
Admission: RE | Admit: 2016-11-29 | Discharge: 2016-11-29 | Disposition: A | Discharge status: Home / Self Care | Payer: Medicare Other | Source: Ambulatory Visit | Attending: Radiation Oncology | Admitting: Radiation Oncology

## 2016-11-29 DIAGNOSIS — Z923 Personal history of irradiation: Secondary | ICD-10-CM | POA: Diagnosis not present

## 2016-11-29 DIAGNOSIS — Z79811 Long term (current) use of aromatase inhibitors: Secondary | ICD-10-CM | POA: Diagnosis not present

## 2016-11-29 DIAGNOSIS — Z170 Estrogen receptor positive status [ER+]: Secondary | ICD-10-CM | POA: Insufficient documentation

## 2016-11-29 DIAGNOSIS — Z7982 Long term (current) use of aspirin: Secondary | ICD-10-CM | POA: Diagnosis not present

## 2016-11-29 DIAGNOSIS — Z880 Allergy status to penicillin: Secondary | ICD-10-CM | POA: Insufficient documentation

## 2016-11-29 DIAGNOSIS — C50211 Malignant neoplasm of upper-inner quadrant of right female breast: Secondary | ICD-10-CM | POA: Diagnosis present

## 2016-11-29 DIAGNOSIS — Z79899 Other long term (current) drug therapy: Secondary | ICD-10-CM | POA: Diagnosis not present

## 2016-11-29 NOTE — Progress Notes (Signed)
Radiation Oncology         [phone] ________________________________  Name: Hannah [name] MRN: [id number]  Date: 11/29/2016  DOB: [birth year]  Follow-Up Visit Note  CC: Saguier, Jacqualine Code, MD    ICD-10-CM   1. 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 She denies having pain.  She is taking letrozole.  She reports her energy level is improving. She states that the area of firmness/swelling under her right arm is much smaller than it was.    ALLERGIES:  is allergic to amoxicillin; imdur [isosorbide dinitrate]; latex; and tetracycline.  Meds: Current Outpatient Prescriptions  Medication Sig Dispense Refill  . albuterol (PROVENTIL HFA;VENTOLIN HFA) 108 (90 Base) MCG/ACT inhaler Inhale 2 puffs into the lungs every 6 (six) hours as needed for wheezing or shortness of breath.    Marland Kitchen alendronate (FOSAMAX) 70 MG tablet Take 70 mg by mouth every Wednesday. Take with a full glass of water on an empty stomach.     Marland Kitchen aspirin EC 81 MG EC tablet Take 1 tablet (81 mg total) by mouth daily.    . clopidogrel (PLAVIX) 75 MG tablet Take 75 mg by mouth daily.    Marland Kitchen ezetimibe (ZETIA) 10 MG tablet TAKE 1 TABLET BY MOUTH  DAILY 90 tablet 2  . furosemide (LASIX) 80 MG tablet Take 0.5 tablets (40 mg total) by mouth daily as needed. 1 tablet in the AM and 1/2 tablet (40 mg) in the early afternoon. 90 tablet 3  . gabapentin (NEURONTIN) 300 MG capsule Take 1 capsule (300 mg total) by mouth 3 (three) times daily. 90 capsule 6  .  glucose blood (ACCU-CHEK GUIDE) test strip Use to test blood sugar 3 times daily 100 each 5  . Insulin Disposable Pump (V-GO 30) KIT Use 1 per day 90 kit 1  . insulin lispro (HUMALOG KWIKPEN) 100 UNIT/ML KiwkPen INJECT 10 UNITS INTO THE SKIN WITH MEALS PLUS SLIDING SCALE 15 mL 2  . insulin lispro (HUMALOG) 100 UNIT/ML injection Inject 76 units daily with  V-GO pump 70 mL 3  . letrozole (FEMARA) 2.5 MG tablet Take 1 tablet (2.5 mg total) by mouth daily. 90 tablet 3  . loratadine (CLARITIN) 10 MG tablet Take 10 mg by mouth daily.    . midodrine (PROAMATINE) 5 MG tablet ONE TABLET BY MOUTH THREE TIMES DAILY WITH MEALS 90 tablet 6  . potassium chloride SA (KLOR-CON M20) 20 MEQ tablet Take 1 tablet (20 mEq total) by mouth daily as needed (when you take lasix). 60 tablet 3  . rosuvastatin (CRESTOR) 40 MG tablet TAKE ONE TABLET BY MOUTH DAILY 90 tablet 3  . sotalol (BETAPACE) 120 MG tablet TAKE ONE TABLET BY MOUTH EVERY TWELVE HOURS 60 tablet 6  . SYMBICORT 160-4.5 MCG/ACT inhaler INHALE TWO PUFFS BY MOUTH TWICE DAILY 10.2 g 2  . traMADol (ULTRAM-ER) 200 MG 24 hr tablet     . TRULICITY 0.07 MA/2.6JF SOPN Inject 0.75 mg into the skin  every Wednesday. 12 pen 2  . ipratropium-albuterol (DUONEB) 0.5-2.5 (3) MG/3ML SOLN Take 3 mLs by nebulization 4 (four) times daily. (Patient not taking: Reported on 11/29/2016) 360 mL 0   No current facility-administered medications for this encounter.     Physical Findings: The patient is in no acute distress. Patient is alert and oriented.  height is 5' 5"  (1.651 m) and weight is 198 lb (89.8 kg). Her oral temperature is 98.4 F (36.9 C). Her blood pressure is 148/53 (abnormal) and her pulse is 86. Her oxygen saturation is 100%. .  No significant changes. Lungs are clear to auscultation bilaterally. Heart has regular rate and rhythm. No palpable cervical, supraclavicular, or axillary adenopathy. Abdomen soft, non-tender, normal bowel sounds.   Left breast no mass or  nipple discharge. Right axilla shows considerable firmness, no palpable lymph node, no signs of infection or drainage. Area is non-tender on palpation. Right breast shows continued edema. No signs of infection. Patient has induration adjacent to lumpectomy scar but no predominant mass.  Lab Findings: Lab Results  Component Value Date   WBC 5.2 07/10/2016   HGB 10.4 (L) 07/10/2016   HCT 30.9 (L) 07/10/2016   MCV 92.1 07/10/2016   PLT 129.0 (L) 07/10/2016    Radiographic Findings: No results found.  Impression:  The patient is recovering from the effects of radiation. No sign of recurrence on clinical exam. Pt has persistent edema in the breast. This does not seem to be bothering her. Axillary discomfort has resolved after antibiotic therapy by Dr. Lucia Gaskins.   Plan: The patient will continue follow up with Dr. Lindi Adie and Dr. Dalbert Batman. The patient will follow up with radiation oncology prn. She will see Dr. Lucia Gaskins next week. ____________________________________ -----------------------------------  Blair Promise, PhD, MD  This document serves as a record of services personally performed by Gery Pray, MD. It was created on his behalf by Linward Natal, a trained medical scribe. The creation of this record is based on the scribe's personal observations and the provider's statements to them. This document has been checked and approved by the attending provider.

## 2016-11-29 NOTE — Progress Notes (Signed)
Hannah [name] is here for follow up.  She denies having pain.  She is taking letrozole.  She reports her energy level is improving.  The skin on her right breast is red with hyperpigmentation.  She has an area of firmness/swelling under her right arm.  She said it is much smaller than it was.  She will see Dr. Lucia Gaskins next week.  BP (!) 148/53 (BP Location: Left Arm, Patient Position: Sitting)   Pulse 86   Temp 98.4 F (36.9 C) (Oral)   Ht 5\' 5"  (1.651 m)   Wt 198 lb (89.8 kg)   SpO2 100%   BMI 32.95 kg/m    Wt Readings from Last 3 Encounters:  11/29/16 198 lb (89.8 kg)  10/23/16 195 lb (88.5 kg)  10/11/16 199 lb 12.8 oz (90.6 kg)

## 2016-12-05 ENCOUNTER — Encounter: Payer: Self-pay | Admitting: Pharmacist

## 2016-12-05 DIAGNOSIS — C50211 Malignant neoplasm of upper-inner quadrant of right female breast: Secondary | ICD-10-CM

## 2016-12-05 DIAGNOSIS — Z170 Estrogen receptor positive status [ER+]: Secondary | ICD-10-CM

## 2016-12-05 NOTE — Progress Notes (Signed)
Telephone documentation  Study code: [id number]  Spoke with patient over the phone today and reviewed with her the pharmacogenetic information listed below for the four genes of interest of the "Pharmacogenetic analysis of toxicities related to administration of taxanes in breast cancer patients" study. The information below was reviewed with the patient. Offered the patient the option of speaking with a [facility] genetic counselor and she was not interested in making an appointment. Provide my office number to the patient if she had additional questions.  **The buccal swab testing was NOT conducted at a CLIA validated lab. The patient was reminded that the information given was for informational proposes only and should NOT be used to make clinical decisions.   Gene Phenotype  CYP3A4 Normal metabolizer   CYP3A5 Intermediate metabolizer   SLCO1B1 Normal function  ABCB1 Low function    Darl Pikes, PharmD, [place] Clinical Pharmacist- Oncology Pharmacy Resident [phone]

## 2016-12-11 ENCOUNTER — Other Ambulatory Visit: Payer: Self-pay

## 2016-12-11 DIAGNOSIS — E860 Dehydration: Secondary | ICD-10-CM

## 2016-12-11 MED ORDER — INSULIN LISPRO 100 UNIT/ML ~~LOC~~ SOLN: SUBCUTANEOUS | 3 refills | Status: DC

## 2016-12-28 ENCOUNTER — Encounter: Payer: Medicare Other | Admitting: Adult Health

## 2017-01-27 NOTE — Progress Notes (Signed)
CLINIC:  Survivorship   REASON FOR VISIT:  Routine follow-up post-treatment for a recent history of breast cancer.  BRIEF ONCOLOGIC HISTORY:    Breast cancer of upper-inner quadrant of right female breast ([place])   12/14/2015 Initial Diagnosis    Right breast biopsy 12:30 position: 2 masses, 1.6 cm mass: Invasive ductal carcinoma, grade 2, ER 0%, PR 0%, HER-2 negative, Ki-67 70%; satellite mass 8 mm: IDC grade 2 ER 5%, PR 5%, HER-2 negative, Ki-67 50%; T1cN0 stage IA clinical stage      01/18/2016 Surgery    Right lumpectomy: Multifocal IDC grade 3, 1.9 cm  ER 0%, PR 0%, HER-2 negative, Ki-67 70% and 0.8 cm satellite mass ER 5%, PR 2%, HER-2 negative, Ki-67 50%, high-grade DCIS, margins negative, 0/1 lymph nodes negative T1 cN0 stage IA      02/17/2016 - 04/06/2016 Chemotherapy    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       04/08/2016 - 04/23/2016 Hospital Admission    Hosp adm for cellulitis      05/24/2016 - 07/25/2016 Radiation Therapy     Adj XRT      06/29/2016 - 07/04/2016 Hospital Admission    Seizure like activity, MRI brain and EEG unremarkable       08/16/2016 -  Anti-estrogen oral therapy    Anastrozole 1 mg by mouth daily       INTERVAL HISTORY:  Hannah [name] presents to the [facility] today for our initial meeting to review her survivorship care plan detailing her treatment course for breast cancer, as well as monitoring long-term side effects of that treatment, education regarding health maintenance, screening, and overall wellness and health promotion.     Overall, Hannah [name] reports feeling well.  She does have occasional hot flashes, and peripheral neuropathy that occurs mainly at night.  She has had the neuropathy since receiving chemotherapy.  She has some balance issues and is undergoing physical therapy for her back, and is getting some help with balance for that as well.  She does take Gabapentin TID for her  neuropathy.  She has h/o bone loss and take fosamax weekly.   REVIEW OF SYSTEMS:  Review of Systems  Constitutional: Negative for appetite change, chills, fatigue, fever and unexpected weight change.  HENT:   Negative for hearing loss and lump/mass.   Eyes: Negative for eye problems and icterus.  Respiratory: Negative for chest tightness, cough and shortness of breath.   Cardiovascular: Negative for chest pain and leg swelling.  Gastrointestinal: Negative for abdominal distention, abdominal pain, constipation, diarrhea, nausea and vomiting.  Endocrine: Negative for hot flashes.  Musculoskeletal: Negative for arthralgias.  Skin: Negative for itching and rash.  Neurological: Negative for dizziness, extremity weakness, headaches and numbness.  Hematological: Negative for adenopathy.  Psychiatric/Behavioral: Negative for depression. The patient is not nervous/anxious.   Breast: Denies any new nodularity, masses, tenderness, nipple changes, or nipple discharge.      ONCOLOGY TREATMENT TEAM:  1. Surgeon:  Dr. Lucia Gaskins at [facility] 2. Medical Oncologist: Dr. Lindi Adie  3. Radiation Oncologist: Dr. Sondra Come    PAST MEDICAL/SURGICAL HISTORY:  Past Medical History:  Diagnosis Date  . Abnormally small mouth   . Anemia   . Bilateral carotid artery stenosis    Bilateral ICA 40-59%/  >50% LECA  . Breast cancer ([place])   . Breast cancer of upper-inner quadrant of right female breast ([place]) 12/16/2015  . CAD (coronary artery disease) cardiologist-  dr Aundra Dubin   Remote CABG in 1998 with redo in 2009  . CHF (congestive heart failure) (HCC)    EF is low normal at 50 to 55% per echo in Jan. 2011  . GERD (gastroesophageal reflux disease)   . Hearing loss of right ear   . History of non-ST elevation myocardial infarction (NSTEMI)    Sept 2014--  thought to be type II HTN w/ LHC without infarct related artery and patent grafts  . History of radiation therapy 05/24/16-07/26/16   right breast  50.4 Gy in 28 fractions, right breast boost 10 Gy in 5 fractions  . Hyperlipidemia   . Hypertension    Resolved.  . Iron deficiency anemia   . Moderate persistent asthma    pulmologist-  Dr. Malvin Johns  . Nonischemic cardiomyopathy ([place])   . PAF (paroxysmal atrial fibrillation) ([place])   . Psoriasis    right leg  . Renal calculus, right   . Renal insufficiency, mild   . S/P AVR    prosthesis valve placement 2009 at same time re-do CABG  . Stroke [facility])    residual rt hearing loss  . Type 2 diabetes mellitus ([place])    monitored by dr Dwyane Dee   Past Surgical History:  Procedure Laterality Date  . AORTIC VALVE REPLACEMENT  [facility]   [facility]done same time is CABG)  . BREAST LUMPECTOMY WITH RADIOACTIVE SEED AND SENTINEL LYMPH NODE BIOPSY Right 01/18/2016   Procedure: RIGHT BREAST LUMPECTOMY WITH RADIOACTIVE SEED AND SENTINEL LYMPH NODE BIOPSY;  Surgeon: DAlphonsa Overall MD;  [facility]  Service: General;  Laterality: Right;  . CARDIAC CATHETERIZATION  03/23/2008   Pre-redo CABG: L main OK, LAD (T), CFX (T), OM1 99%, RCA (T), LIMA-LAD OK, SVG-OM(?3) OK w/ little florw to OM2, SVG-RCA OK. EF NL  . CARPAL TUNNEL RELEASE    . COLONOSCOPY    . CORONARY ARTERY BYPASS GRAFT  1998 &  re-do 2009   Had LIMA to DX/LAD, SVG [facility] 2 marginal [facility]; SVG to 3rd OM and PD at time of redo  . CYSTOSCOPY W/ URETERAL STENT PLACEMENT Right 12/20/2014   Procedure: CYSTOSCOPY WITH RETROGRADE PYELOGRAM/URETERAL STENT PLACEMENT;  Surgeon: PCleon Gustin MD;  [facility]  Service: Urology;  Laterality: Right;  . EYE SURGERY Bilateral    cataracts  . HOLMIUM LASER APPLICATION Right 79/89/2119  Procedure:  HOLMIUM LASER LITHOTRIPSY;  Surgeon: PCleon Gustin MD;  [facility]  Service: Urology;  Laterality: Right;  . LEFT HEART CATHETERIZATION WITH CORONARY/GRAFT ANGIOGRAM N/A 02/23/2013   Procedure: LEFT  HEART CATHETERIZATION WITH CBeatrix Fetters  Surgeon: MBlane Ohara MD;  [facility];  Service: Cardiovascular;  Laterality: N/A;  . PORTACATH PLACEMENT Left 01/18/2016   Procedure: INSERTION PORT-A-CATH;  Surgeon: DAlphonsa Overall MD;  [place] [facility]  Service: General;  Laterality: Left;  . TONSILLECTOMY    . TRANSTHORACIC ECHOCARDIOGRAM  02-24-2013      mild LVH,  ef 50-55%/  AV bioprosthesis was present with very mild stenosis and no regurg., mean grandient 158mg, peak grandient 2023m /  mild MR/  mild LAE and RAE/  moderate TR  . TUBAL LIGATION       ALLERGIES:  Allergies  Allergen Reactions  . Amoxicillin Rash    Has patient had a PCN reaction causing immediate rash, facial/tongue/throat swelling, SOB or lightheadedness with hypotension: No Has patient had a  PCN reaction causing severe rash involving mucus membranes or skin necrosis: Yes Has patient had a PCN reaction that required hospitalization No Has patient had a PCN reaction occurring within the last 10 years: No If all of the above answers are "NO", then may proceed with Cephalosporin use.   . Imdur [Isosorbide Dinitrate] Other (See Comments)    Headache/severe hypotension/Syncope  . Latex Itching  . Tetracycline Rash     CURRENT MEDICATIONS:  Outpatient Encounter Prescriptions as of 01/28/2017  Medication Sig  . albuterol (PROVENTIL HFA;VENTOLIN HFA) 108 (90 Base) MCG/ACT inhaler Inhale 2 puffs into the lungs every 6 (six) hours as needed for wheezing or shortness of breath.  Marland Kitchen alendronate (FOSAMAX) 70 MG tablet Take 70 mg by mouth every Wednesday. Take with a full glass of water on an empty stomach.   Marland Kitchen aspirin EC 81 MG EC tablet Take 1 tablet (81 mg total) by mouth daily.  . clopidogrel (PLAVIX) 75 MG tablet Take 75 mg by mouth daily.  Marland Kitchen ezetimibe (ZETIA) 10 MG tablet TAKE 1 TABLET BY MOUTH  DAILY  . gabapentin (NEURONTIN) 300 MG capsule Take 1 capsule (300 mg total) by mouth 3 (three) times  daily.  Marland Kitchen glucose blood (ACCU-CHEK GUIDE) test strip Use to test blood sugar 3 times daily  . Insulin Disposable Pump (V-GO 30) KIT Use 1 per day  . insulin lispro (HUMALOG KWIKPEN) 100 UNIT/ML KiwkPen INJECT 10 UNITS INTO THE SKIN WITH MEALS PLUS SLIDING SCALE  . insulin lispro (HUMALOG) 100 UNIT/ML injection Inject 76 units daily with  V-GO pump  . letrozole (FEMARA) 2.5 MG tablet Take 1 tablet (2.5 mg total) by mouth daily.  Marland Kitchen loratadine (CLARITIN) 10 MG tablet Take 10 mg by mouth daily.  . midodrine (PROAMATINE) 5 MG tablet ONE TABLET BY MOUTH THREE TIMES DAILY WITH MEALS  . rosuvastatin (CRESTOR) 40 MG tablet TAKE ONE TABLET BY MOUTH DAILY  . sotalol (BETAPACE) 120 MG tablet TAKE ONE TABLET BY MOUTH EVERY TWELVE HOURS  . SYMBICORT 160-4.5 MCG/ACT inhaler INHALE TWO PUFFS BY MOUTH TWICE DAILY  . traMADol (ULTRAM-ER) 200 MG 24 hr tablet   . TRULICITY 9.14 NW/2.9FA SOPN Inject 0.75 mg into the skin every Wednesday.  . [DISCONTINUED] furosemide (LASIX) 80 MG tablet Take 0.5 tablets (40 mg total) by mouth daily as needed. 1 tablet in the AM and 1/2 tablet (40 mg) in the early afternoon.  . [DISCONTINUED] ipratropium-albuterol (DUONEB) 0.5-2.5 (3) MG/3ML SOLN Take 3 mLs by nebulization 4 (four) times daily. (Patient not taking: Reported on 11/29/2016)  . [DISCONTINUED] potassium chloride SA (KLOR-CON M20) 20 MEQ tablet Take 1 tablet (20 mEq total) by mouth daily as needed (when you take lasix).   No facility-administered encounter medications on file as of 01/28/2017.      ONCOLOGIC FAMILY HISTORY:  Family History  Problem Relation Age of Onset  . Heart disease Father   . Heart failure Father   . Diabetes Maternal Grandmother   . Heart disease Maternal Grandmother   . Diabetes Son   . Healthy Brother        #1  . Heart attack Brother        #2  . Heart disease Brother        #2      SOCIAL HISTORY:  Hannah [name] is married and lives with her husband in [place], [place].    Ms. Gilcrease is currently retired.  She denies any current or history of tobacco, alcohol, or  illicit drug use.     PHYSICAL EXAMINATION:  Vital Signs:   Vitals:   01/28/17 1350  BP: (!) 120/56  Pulse: 90  Resp: 18  Temp: 98.2 F (36.8 C)  SpO2: 99%   Filed Weights   01/28/17 1350  Weight: 193 lb 3.2 oz (87.6 kg)   General: Well-nourished, well-appearing female in no acute distress.  She is unaccompanied today.   HEENT: Head is normocephalic.  Pupils equal and reactive to light. Conjunctivae clear without exudate.  Sclerae anicteric. Oral mucosa is pink, moist.  Oropharynx is pink without lesions or erythema.  Lymph: No cervical, supraclavicular, or infraclavicular lymphadenopathy noted on palpation.  Cardiovascular: Regular rate and rhythm.Marland Kitchen Respiratory: Clear to auscultation bilaterally. Chest expansion symmetric; breathing non-labored.  GI: Abdomen soft and round; non-tender, non-distended. Bowel sounds normoactive.  GU: Deferred.  Neuro: No focal deficits. Steady gait.  Psych: Mood and affect normal and appropriate for situation.  Extremities: No edema. MSK: No focal spinal tenderness to palpation.  Full range of motion in bilateral upper extremities Skin: Warm and dry.  LABORATORY DATA:  None for this visit.  DIAGNOSTIC IMAGING:  None for this visit.      ASSESSMENT AND PLAN:  Ms.. Delmar is a pleasant 71 y.o. female with Stage IA right breast invasive ductal carcinoma, ER+/PR+/HER2-, diagnosed in 12/2015, treated with lumpectomy, adjuvant chemotherapy, adjuvant radiation therapy, and anti-estrogen therapy with Anastrozole beginning in 08/2016.  She presents to the Survivorship Clinic for our initial meeting and routine follow-up post-completion of treatment for breast cancer.    1. Stage IA right breast cancer:  Ms. Ringstad is continuing to recover from definitive treatment for breast cancer. She will follow-up with her medical oncologist, Dr. Lindi Adie in 03/2017 with  history and physical exam per surveillance protocol.  She is past due for her mammogram and I ordered this today.  She will continue her anti-estrogen therapy with Anastrozole. Thus far, she is tolerating the Anastrozole well, with minimal side effects. She was instructed to make Dr. Lindi Adie or myself aware if she begins to experience any worsening side effects of the medication and I could see her back in clinic to help manage those side effects, as needed. Today, a comprehensive survivorship care plan and treatment summary was reviewed with the patient today detailing her breast cancer diagnosis, treatment course, potential late/long-term effects of treatment, appropriate follow-up care with recommendations for the future, and patient education resources.  A copy of this summary, along with a letter will be sent to the patient's primary care provider via mail/fax/In Basket message after today's visit.    2. Peripheral neuropathy: Patient to continue to work with physical therapy on her balance and take Gabapentin TID.  It sounds like the medication and PT is helping improve her neuropathy.   3. Bone health:  Given Ms. Pothier's age/history of breast cancer and her current treatment regimen including anti-estrogen therapy with Anastrozole, she is at risk for bone demineralization.  She has h/o osteoporosis and is taking Fosamax.  Her next bone density is due and I've ordered this today.   In the meantime, she was encouraged to increase her consumption of foods rich in calcium, as well as increase her weight-bearing activities.  She was given education on specific activities to promote bone health.  4. Cancer screening:  Due to Ms. Schriner's history and her age, she should receive screening for skin cancers, colon cancer, and gynecologic cancers.  The information and recommendations are listed on the patient's  comprehensive care plan/treatment summary and were reviewed in detail with the patient.    5. Health  maintenance and wellness promotion: Ms. Bree was encouraged to consume 5-7 servings of fruits and vegetables per day. We reviewed the "Nutrition Rainbow" handout, as well as the handout "Take Control of Your Health and Reduce Your Cancer Risk" from the [employer].  She was also encouraged to engage in moderate to vigorous exercise for 30 minutes per day most days of the week. We discussed the [employer] fitness program, which is designed for cancer survivors to help them become more physically fit after cancer treatments.  She was instructed to limit her alcohol consumption and continue to abstain from tobacco use.     6. Support services/counseling: It is not uncommon for this period of the patient's cancer care trajectory to be one of many emotions and stressors.  We discussed an opportunity for her to participate in the next session of [facility] ("Finding Your New Normal") support group series designed for patients after they have completed treatment.   Ms. Rau was encouraged to take advantage of our many other support services programs, support groups, and/or counseling in coping with her new life as a cancer survivor after completing anti-cancer treatment.  She was offered support today through active listening and expressive supportive counseling.  She was given information regarding our available services and encouraged to contact me with any questions or for help enrolling in any of our support group/programs.    Dispo:   -Return to cancer center in 03/2017 for follow up with Dr. Lindi Adie  -Mammogram due at next available appt at the breast center -Follow up with Dr. Lucia Gaskins in 09/2017 -She is welcome to return back to the Survivorship Clinic at any time; no additional follow-up needed at this time.  -Consider referral back to survivorship as a long-term survivor for continued surveillance  A total of (30) minutes of face-to-face time was spent with this patient with greater than  50% of that time in counseling and care-coordination.   Gardenia Phlegm, [place] [phone]   Note: PRIMARY CARE PROVIDER Mackie Pai, [place] [phone]

## 2017-01-28 ENCOUNTER — Ambulatory Visit (HOSPITAL_BASED_OUTPATIENT_CLINIC_OR_DEPARTMENT_OTHER): Payer: Medicare Other | Admitting: Adult Health

## 2017-01-28 ENCOUNTER — Encounter: Payer: Self-pay | Admitting: Adult Health

## 2017-01-28 VITALS — BP 120/56 | HR 90 | Temp 98.2°F | Resp 18 | Ht 65.0 in | Wt 193.2 lb

## 2017-01-28 DIAGNOSIS — M810 Age-related osteoporosis without current pathological fracture: Secondary | ICD-10-CM | POA: Diagnosis not present

## 2017-01-28 DIAGNOSIS — G620 Drug-induced polyneuropathy: Secondary | ICD-10-CM

## 2017-01-28 DIAGNOSIS — C50211 Malignant neoplasm of upper-inner quadrant of right female breast: Secondary | ICD-10-CM

## 2017-01-28 DIAGNOSIS — Z170 Estrogen receptor positive status [ER+]: Secondary | ICD-10-CM | POA: Diagnosis not present

## 2017-01-28 DIAGNOSIS — N951 Menopausal and female climacteric states: Secondary | ICD-10-CM | POA: Diagnosis not present

## 2017-01-28 DIAGNOSIS — E2839 Other primary ovarian failure: Secondary | ICD-10-CM

## 2017-01-29 ENCOUNTER — Ambulatory Visit (HOSPITAL_BASED_OUTPATIENT_CLINIC_OR_DEPARTMENT_OTHER)
Admission: RE | Admit: 2017-01-29 | Discharge: 2017-01-29 | Disposition: A | Discharge status: Home / Self Care | Payer: Medicare Other | Source: Ambulatory Visit | Attending: Medical | Admitting: Medical

## 2017-01-29 ENCOUNTER — Ambulatory Visit (INDEPENDENT_AMBULATORY_CARE_PROVIDER_SITE_OTHER): Payer: Medicare Other | Admitting: Medical

## 2017-01-29 ENCOUNTER — Encounter: Payer: Self-pay | Admitting: Medical

## 2017-01-29 VITALS — BP 105/50 | HR 87 | Temp 98.3°F | Resp 16 | Ht 65.0 in | Wt 194.6 lb

## 2017-01-29 DIAGNOSIS — M19032 Primary osteoarthritis, left wrist: Secondary | ICD-10-CM | POA: Insufficient documentation

## 2017-01-29 DIAGNOSIS — M25532 Pain in left wrist: Secondary | ICD-10-CM

## 2017-01-29 DIAGNOSIS — M79642 Pain in left hand: Secondary | ICD-10-CM | POA: Insufficient documentation

## 2017-01-29 DIAGNOSIS — D649 Anemia, unspecified: Secondary | ICD-10-CM

## 2017-01-29 DIAGNOSIS — R937 Abnormal findings on diagnostic imaging of other parts of musculoskeletal system: Secondary | ICD-10-CM | POA: Insufficient documentation

## 2017-01-29 LAB — CBC WITH DIFFERENTIAL/PLATELET
Basophils Absolute: 0 10*3/uL (ref 0.0–0.1)
Basophils Relative: 0.8 % (ref 0.0–3.0)
Eosinophils Absolute: 0.6 10*3/uL (ref 0.0–0.7)
Eosinophils Relative: 9.4 % — ABNORMAL HIGH (ref 0.0–5.0)
HCT: 31.2 % — ABNORMAL LOW (ref 36.0–46.0)
Hemoglobin: 10.3 g/dL — ABNORMAL LOW (ref 12.0–15.0)
Lymphocytes Relative: 14 % (ref 12.0–46.0)
Lymphs Abs: 0.9 10*3/uL (ref 0.7–4.0)
MCHC: 33 g/dL (ref 30.0–36.0)
MCV: 91.4 fl (ref 78.0–100.0)
Monocytes Absolute: 0.7 10*3/uL (ref 0.1–1.0)
Monocytes Relative: 10.6 % (ref 3.0–12.0)
Neutro Abs: 4.2 10*3/uL (ref 1.4–7.7)
Neutrophils Relative %: 65.2 % (ref 43.0–77.0)
Platelets: 144 10*3/uL — ABNORMAL LOW (ref 150.0–400.0)
RBC: 3.41 Mil/uL — ABNORMAL LOW (ref 3.87–5.11)
RDW: 13.9 % (ref 11.5–15.5)
WBC: 6.4 10*3/uL (ref 4.0–10.5)

## 2017-01-29 LAB — URIC ACID: Uric Acid, Serum: 3.9 mg/dL (ref 2.4–7.0)

## 2017-01-29 LAB — C-REACTIVE PROTEIN: CRP: 0.2 mg/dL — ABNORMAL LOW (ref 0.5–20.0)

## 2017-01-29 LAB — SEDIMENTATION RATE: Sed Rate: 14 mm/hr (ref 0–30)

## 2017-01-29 NOTE — Patient Instructions (Signed)
For left hand and wrist pain will get arthritis panel studies and xray.  Can use tylenol for mild pain. Tx with nsaids limited due to kidney function.  For hx of light headed and near syncope as we discusse hx take midrodine 3 times dailly. If light headed event in future take rest as you did. Check bp, pulse and sugar level.  Follow up date to be determined.

## 2017-01-29 NOTE — Progress Notes (Signed)
Subjective:    Patient ID: Hannah [name], female    DOB: Oct 30, 1945, 71 y.o.   MRN: [id number]  HPI  Pt in for a follow up.  Pt has some recent joint pains. She has concern for arthritis.pt states her left wrist recently has been hurting and left hand knuckles have been hurting for about a week. Rt hand swollen but left hand is worse. Recently left side worse than left. No elbow pains but some knee pains. Not very painful now. But this weekend pain was worse. Pt has some kidney dysfunction.  Pt give me update recently feeling very brief light headed weak. Remote hx of syncope. Mri negative and eeg was negative. The other day sat down rested 10 seconds and resolved. Pt thinks she did not take mid day dose midrodine that day.  Pt had cardiologist work up and they thought syncope and near syncope events in past related to bp. She was given midrodine.   Review of Systems  Constitutional: Negative for chills, fatigue and fever.  Respiratory: Negative for cough, chest tightness and shortness of breath.   Cardiovascular: Negative for chest pain and palpitations.  Gastrointestinal: Negative for abdominal pain.  Musculoskeletal: Positive for arthralgias. Negative for back pain.  Hematological: Negative for adenopathy. Does not bruise/bleed easily.  Psychiatric/Behavioral: Negative for behavioral problems and confusion.   Past Medical History:  Diagnosis Date  . Abnormally small mouth   . Anemia   . Bilateral carotid artery stenosis    Bilateral ICA 40-59%/  >50% LECA  . Breast cancer ([place])   . Breast cancer of upper-inner quadrant of right female breast ([place]) 12/16/2015  . CAD (coronary artery disease) cardiologist-  dr Aundra Dubin   Remote CABG in 1998 with redo in 2009  . CHF (congestive heart failure) (H[facility])    EF is low normal at 50 to 55% per echo in Jan. 2011  . GERD (gastroesophageal reflux disease)   . Hearing loss of right ear   . History of non-ST elevation myocardial infarction  (NSTEMI)    Sept 2014--  thought to be type II HTN w/ LHC without infarct related artery and patent grafts  . History of radiation therapy 05/24/16-07/26/16   right breast 50.4 Gy in 28 fractions, right breast boost 10 Gy in 5 fractions  . Hyperlipidemia   . Hypertension    Resolved.  . Iron deficiency anemia   . Moderate persistent asthma    pulmologist-  Dr. Malvin Johns  . Nonischemic cardiomyopathy ([place])   . PAF (paroxysmal atrial fibrillation) ([place])   . Psoriasis    right leg  . Renal calculus, right   . Renal insufficiency, mild   . S/P AVR    prosthesis valve placement 2009 at same time re-do CABG  . Stroke [facility])    residual rt hearing loss  . Type 2 diabetes mellitus ([place])    monitored by dr Dwyane Dee     Social History   Social History  . Marital status: Married    Spouse name: N/A  . Number of children: 2  . Years of education: N/A   Occupational History  . retired    Social History Main Topics  . Smoking status: Never Smoker  . Smokeless tobacco: Never Used  . Alcohol use No  . Drug use: No  . Sexual activity: Yes    Birth control/ protection: None   Other Topics Concern  . Not on file   Social History Narrative  . No narrative on  file    Past Surgical History:  Procedure Laterality Date  . AORTIC VALVE REPLACEMENT  2009   [facility] (done same time is CABG)  . BREAST LUMPECTOMY WITH RADIOACTIVE SEED AND SENTINEL LYMPH NODE BIOPSY Right 01/18/2016   Procedure: RIGHT BREAST LUMPECTOMY WITH RADIOACTIVE SEED AND SENTINEL LYMPH NODE BIOPSY;  Surgeon: DAlphonsa Overall MD;  [facility]  Service: General;  Laterality: Right;  . CARDIAC CATHETERIZATION  03/23/2008   Pre-redo CABG: L main OK, LAD (T), CFX (T), OM1 99%, RCA (T), LIMA-LAD OK, SVG-OM(?3) OK w/ little florw to OM2, SVG-RCA OK. EF NL  . CARPAL TUNNEL RELEASE    . COLONOSCOPY    . CORONARY ARTERY BYPASS GRAFT  1998 &  re-do 2009   Had LIMA to DX/LAD, SVG to 2 marginal  br[facility]; SVG to 3rd OM and PD at time of redo  . CYSTOSCOPY W/ URETERAL STENT PLACEMENT Right 12/20/2014   Procedure: CYSTOSCOPY WITH RETROGRADE PYELOGRAM/URETERAL STENT PLACEMENT;  Surgeon: PCleon Gustin MD;  [facility]  Service: Urology;  Laterality: Right;  . EYE SURGERY Bilateral    cataracts  . HOLMIUM LASER APPLICATION Right 79/37/1696  Procedure:  HOLMIUM LASER LITHOTRIPSY;  Surgeon: PCleon Gustin MD;  [facility]  Service: Urology;  Laterality: Right;  . LEFT HEART CATHETERIZATION WITH CORONARY/GRAFT ANGIOGRAM N/A 02/23/2013   Procedure: LEFT HEART CATHETERIZATION WITH CBeatrix Fetters  Surgeon: MBlane Ohara MD;  [facility];  Service: Cardiovascular;  Laterality: N/A;  . PORTACATH PLACEMENT Left 01/18/2016   Procedure: INSERTION PORT-A-CATH;  Surgeon: DAlphonsa Overall MD;  [place] [facility]  Service: General;  Laterality: Left;  . TONSILLECTOMY    . TRANSTHORACIC ECHOCARDIOGRAM  02-24-2013      mild LVH,  ef 50-55%/  AV bioprosthesis was present with very mild stenosis and no regurg., mean grandient 148mg, peak grandient 2041m /  mild MR/  mild LAE and RAE/  moderate TR  . TUBAL LIGATION      Family History  Problem Relation Age of Onset  . Heart disease Father   . Heart failure Father   . Diabetes Maternal Grandmother   . Heart disease Maternal Grandmother   . Diabetes Son   . Healthy Brother        #1  . Heart attack Brother        #2  . Heart disease Brother        #2    Allergies  Allergen Reactions  . Amoxicillin Rash    Has patient had a PCN reaction causing immediate rash, facial/tongue/throat swelling, SOB or lightheadedness with hypotension: No Has patient had a PCN reaction causing severe rash involving mucus membranes or skin necrosis: Yes Has patient had a PCN reaction that required hospitalization No Has patient had a PCN reaction occurring within  the last 10 years: No If all of the above answers are "NO", then may proceed with Cephalosporin use.   . Imdur [Isosorbide Dinitrate] Other (See Comments)    Headache/severe hypotension/Syncope  . Latex Itching  . Tetracycline Rash    Current Outpatient Prescriptions on File Prior to Visit  Medication Sig Dispense Refill  . albuterol (PROVENTIL HFA;VENTOLIN HFA) 108 (90 Base) MCG/ACT inhaler Inhale 2 puffs into the lungs every 6 (six) hours as needed for wheezing or shortness of breath.    . aMarland Kitchenendronate (FOSAMAX) 70 MG tablet Take 70 mg by mouth  every Wednesday. Take with a full glass of water on an empty stomach.     Marland Kitchen aspirin EC 81 MG EC tablet Take 1 tablet (81 mg total) by mouth daily.    . clopidogrel (PLAVIX) 75 MG tablet Take 75 mg by mouth daily.    Marland Kitchen ezetimibe (ZETIA) 10 MG tablet TAKE 1 TABLET BY MOUTH  DAILY 90 tablet 2  . gabapentin (NEURONTIN) 300 MG capsule Take 1 capsule (300 mg total) by mouth 3 (three) times daily. 90 capsule 6  . glucose blood (ACCU-CHEK GUIDE) test strip Use to test blood sugar 3 times daily 100 each 5  . Insulin Disposable Pump (V-GO 30) KIT Use 1 per day 90 kit 1  . insulin lispro (HUMALOG KWIKPEN) 100 UNIT/ML KiwkPen INJECT 10 UNITS INTO THE SKIN WITH MEALS PLUS SLIDING SCALE 15 mL 2  . insulin lispro (HUMALOG) 100 UNIT/ML injection Inject 76 units daily with  V-GO pump 70 mL 3  . letrozole (FEMARA) 2.5 MG tablet Take 1 tablet (2.5 mg total) by mouth daily. 90 tablet 3  . loratadine (CLARITIN) 10 MG tablet Take 10 mg by mouth daily.    . midodrine (PROAMATINE) 5 MG tablet ONE TABLET BY MOUTH THREE TIMES DAILY WITH MEALS 90 tablet 6  . rosuvastatin (CRESTOR) 40 MG tablet TAKE ONE TABLET BY MOUTH DAILY 90 tablet 3  . sotalol (BETAPACE) 120 MG tablet TAKE ONE TABLET BY MOUTH EVERY TWELVE HOURS 60 tablet 6  . SYMBICORT 160-4.5 MCG/ACT inhaler INHALE TWO PUFFS BY MOUTH TWICE DAILY 10.2 g 2  . traMADol (ULTRAM-ER) 200 MG 24 hr tablet     . TRULICITY 7.34  YZ/7.0DU SOPN Inject 0.75 mg into the skin every Wednesday. 12 pen 2   No current facility-administered medications on file prior to visit.     BP (!) 105/50   Pulse 87   Temp 98.3 F (36.8 C) (Oral)   Resp 16   Ht 5' 5"  (1.651 m)   Wt 194 lb 9.6 oz (88.3 kg)   LMP  (Exact Date)   SpO2 99%   BMI 32.38 kg/m       Objective:   Physical Exam  General Mental Status- Alert. General Appearance- Not in acute distress.   Skin General: Color- Normal Color. Moisture- Normal Moisture.  Neck Carotid Arteries- Normal color. Moisture- Normal Moisture. No carotid bruits. No JVD.  Chest and Lung Exam Auscultation: Breath Sounds:-Normal.  Cardiovascular Auscultation:Rythm- Regular. Murmurs & Other Heart Sounds:Auscultation of the heart reveals- No Murmurs.   Neurologic Cranial Nerve exam:- CN III-XII intact(No nystagmus), symmetric smile. Strength:- 5/5 equal and symmetric strength both upper and lower extremities.  Left upper ext- No elbow pain on rom. No swelling of elbow. Left forearm no swollen. Lt wrist- mild swollen and moderate tender to palpation. Left hand mild swelling of mcp joints left side.  Rt hand- faint swelling of joints.less than left side.  Knees- good rom, no crepitus. No instablity. No warmth.        Assessment & Plan:  For left hand and wrist pain will get arthritis panel studies and xray.  Can use tylenol for mild pain. Tx with nsaids limited due to kidney function.  For hx of light headed and near syncope as we discusse hx take midrodine 3 times dailly. If light headed event in future take rest as you did. Check bp, pulse and sugar level.  Follow up date to be determined.  Cbc check today since hx of anemia and  low bp. Lamari Beckles, Percell Miller, PA-C

## 2017-01-30 LAB — ANA: Anti Nuclear Antibody(ANA): NEGATIVE

## 2017-01-30 LAB — RHEUMATOID FACTOR: Rhuematoid fact SerPl-aCnc: 36 IU/mL — ABNORMAL HIGH (ref <14)

## 2017-02-06 ENCOUNTER — Telehealth: Payer: Self-pay | Admitting: *Deleted

## 2017-02-06 ENCOUNTER — Ambulatory Visit
Admission: RE | Admit: 2017-02-06 | Discharge: 2017-02-06 | Disposition: A | Discharge status: Home / Self Care | Payer: Medicare Other | Source: Ambulatory Visit | Attending: Adult Health | Admitting: Adult Health

## 2017-02-06 ENCOUNTER — Other Ambulatory Visit: Payer: Self-pay | Admitting: Adult Health

## 2017-02-06 DIAGNOSIS — C50211 Malignant neoplasm of upper-inner quadrant of right female breast: Secondary | ICD-10-CM

## 2017-02-06 DIAGNOSIS — Z170 Estrogen receptor positive status [ER+]: Principal | ICD-10-CM

## 2017-02-06 DIAGNOSIS — E2839 Other primary ovarian failure: Secondary | ICD-10-CM

## 2017-02-06 HISTORY — DX: Personal history of antineoplastic chemotherapy: Z92.21

## 2017-02-06 HISTORY — DX: Personal history of irradiation: Z92.3

## 2017-02-06 NOTE — Telephone Encounter (Signed)
"  Spoke with NP for order request.  Asked NP for right side and should have requested [place] of left.  Patient here waiting at this time, need order re-entry along with verbal order to proceed at this time."  Provider notified, verbal order provided.

## 2017-02-12 ENCOUNTER — Encounter: Payer: Self-pay | Admitting: Podiatry

## 2017-02-12 ENCOUNTER — Ambulatory Visit (INDEPENDENT_AMBULATORY_CARE_PROVIDER_SITE_OTHER): Payer: Medicare Other | Admitting: Podiatry

## 2017-02-12 DIAGNOSIS — M2041 Other hammer toe(s) (acquired), right foot: Secondary | ICD-10-CM

## 2017-02-12 DIAGNOSIS — L02611 Cutaneous abscess of right foot: Secondary | ICD-10-CM | POA: Diagnosis not present

## 2017-02-12 DIAGNOSIS — M2042 Other hammer toe(s) (acquired), left foot: Secondary | ICD-10-CM | POA: Diagnosis not present

## 2017-02-12 DIAGNOSIS — L84 Corns and callosities: Secondary | ICD-10-CM | POA: Diagnosis not present

## 2017-02-12 DIAGNOSIS — L03031 Cellulitis of right toe: Secondary | ICD-10-CM | POA: Diagnosis not present

## 2017-02-12 DIAGNOSIS — E1149 Type 2 diabetes mellitus with other diabetic neurological complication: Secondary | ICD-10-CM

## 2017-02-12 MED ORDER — CEPHALEXIN 500 MG PO CAPS: 500.0000 mg | ORAL_CAPSULE | Freq: Three times a day (TID) | ORAL | 0 refills | Status: DC

## 2017-02-12 NOTE — Progress Notes (Signed)
   Subjective:    Patient ID: Hannah [name], female    DOB: Nov 11, 1945, 71 y.o.   MRN: [id number]  HPI  71 year old female persists the office they for concerns of pain to her right fifth toe which is been ongoing for about 1 week. She does that she had stubbed her toe although she denies recalling this or any other injury to her foot. She states it is sore over the last week and she has noticed some mild swelling and redness to her fifth toe as well. She's had no recent treatment for this. She is diabetic and her last A1c was 6.9. 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 She denies any claudication symptoms. She is currently on gabapentin for neuropathy. She has no other concerns today.   Review of Systems  All other systems reviewed and are negative.      Objective:   Physical Exam General: AAO x3, NAD  Dermatological: Along the medial aspect of the right fifth digit on the PIPJ is a thick hyperkeratotic lesion with tenderness to palpation in this area. After debridement there is no underlying ulceration, drainage however the area is pre-ulcerative. There is localized edema and erythema to the right fifth toe but there is no ascending cellulitis. There is no fluctuance, crepitus, malodor. No other open lesions or pre-ulcerative lesions identified.  Vascular: Dorsalis Pedis artery and Posterior Tibial artery pedal pulses are 2/4 bilateral with immedate capillary fill time. Pedal hair growth present. There is no pain with calf compression, swelling, warmth, erythema.   Neruologic: Densation is decreased with Derrel Nip monofilament. She is previously been diagnosed with neuropathy.  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 There is tenderness to the hyperkeratotic lesion to the right fifth toe but there is no other area of tenderness identified at this time. Muscular strength  5/5 in all groups tested bilateral.  Gait: Unassisted, Nonantalgic.      Assessment & Plan:  71 year old female with pre-ulcerative callus right fifth digit with localized infection, inflammation -Treatment options discussed including all alternatives, risks, and complications -Etiology of symptoms were discussed -Hyperkeratotic lesion, pre-ulcerative lesion was sharply debrided to the any complications or bleeding. Recommended a small amount of antibiotic ointment and a bandage daily. I dispensed offloading pads to help separate the toes. Also given her neuropathy to help prevent this from worsening I recommended open toed shoe. I recommended a surgical shoe but the patient declined today. Prescribed Keflex to the localized swelling or redness. She's had this medication before without any issues. Monitor for any clinical signs or symptoms of infection and directed to call the office immediately should any occur or go to the ER. -Follow-up in 2 weeks or sooner if needed.  Celesta Gentile, DPM

## 2017-02-14 ENCOUNTER — Other Ambulatory Visit (INDEPENDENT_AMBULATORY_CARE_PROVIDER_SITE_OTHER): Payer: Medicare Other

## 2017-02-14 DIAGNOSIS — E1165 Type 2 diabetes mellitus with hyperglycemia: Secondary | ICD-10-CM

## 2017-02-14 DIAGNOSIS — Z794 Long term (current) use of insulin: Secondary | ICD-10-CM

## 2017-02-14 LAB — COMPREHENSIVE METABOLIC PANEL
ALT: 13 U/L (ref 0–35)
AST: 23 U/L (ref 0–37)
Albumin: 3.2 g/dL — ABNORMAL LOW (ref 3.5–5.2)
Alkaline Phosphatase: 50 U/L (ref 39–117)
BUN: 22 mg/dL (ref 6–23)
CO2: 34 mEq/L — ABNORMAL HIGH (ref 19–32)
Calcium: 9.5 mg/dL (ref 8.4–10.5)
Chloride: 103 mEq/L (ref 96–112)
Creatinine, Ser: 1.44 mg/dL — ABNORMAL HIGH (ref 0.40–1.20)
GFR: 38.09 mL/min — ABNORMAL LOW (ref >60.00)
Glucose, Bld: 91 mg/dL (ref 70–99)
Potassium: 4.6 mEq/L (ref 3.5–5.1)
Sodium: 141 mEq/L (ref 135–145)
Total Bilirubin: 0.7 mg/dL (ref 0.2–1.2)
Total Protein: 6.3 g/dL (ref 6.0–8.3)

## 2017-02-14 LAB — MICROALBUMIN / CREATININE URINE RATIO
Creatinine,U: 128 mg/dL
Microalb Creat Ratio: 10.4 mg/g (ref 0.0–30.0)
Microalb, Ur: 13.3 mg/dL — ABNORMAL HIGH (ref 0.0–1.9)

## 2017-02-14 LAB — HEMOGLOBIN A1C: Hgb A1c MFr Bld: 7 % — ABNORMAL HIGH (ref 4.6–6.5)

## 2017-02-15 ENCOUNTER — Telehealth: Payer: Self-pay | Admitting: Endocrinology

## 2017-02-15 NOTE — Telephone Encounter (Signed)
Routing to you °

## 2017-02-15 NOTE — Telephone Encounter (Signed)
Called patient and let her know that she can call our office to see if there is a cancellation in hopes to get an earlier appointment with Dr. Dwyane Dee.

## 2017-02-15 NOTE — Telephone Encounter (Signed)
Patient needs to know if she will have to redo her labs due to having labs done 02/14/17 but moving her appointment to 03/28/17. Call patient to advise.

## 2017-02-19 ENCOUNTER — Ambulatory Visit: Payer: Medicare Other | Admitting: Endocrinology

## 2017-02-20 ENCOUNTER — Encounter: Payer: Self-pay | Admitting: Endocrinology

## 2017-02-20 ENCOUNTER — Other Ambulatory Visit: Payer: Self-pay | Admitting: Emergency Medicine

## 2017-02-20 ENCOUNTER — Ambulatory Visit (INDEPENDENT_AMBULATORY_CARE_PROVIDER_SITE_OTHER): Payer: Medicare Other | Admitting: Endocrinology

## 2017-02-20 VITALS — BP 114/68 | HR 80 | Ht 65.0 in | Wt 194.4 lb

## 2017-02-20 DIAGNOSIS — E1165 Type 2 diabetes mellitus with hyperglycemia: Secondary | ICD-10-CM

## 2017-02-20 DIAGNOSIS — Z794 Long term (current) use of insulin: Secondary | ICD-10-CM | POA: Diagnosis not present

## 2017-02-20 DIAGNOSIS — E860 Dehydration: Secondary | ICD-10-CM

## 2017-02-20 NOTE — Patient Instructions (Signed)
More sugars after lunch

## 2017-02-20 NOTE — Progress Notes (Signed)
Patient ID: Hannah [name], female   DOB: Mar 26, 1946, 71 y.o.   MRN: [id number]           Reason for Appointment: Follow-up for Type 2 Diabetes   History of Present Illness:          Date of diagnosis of type 2 diabetes mellitus: Late 1980s        Background history:  She was previously treated with metformin prior to starting insulin She thinks she has been on insulin for at least 10 years and mostly taking Lantus and other types of insulin with it Her metformin was stopped when she started insulin and also has had renal dysfunction Previously her A1c had been consistently over 8% and occasionally higher.  Recent history:   INSULIN regimen is described as:  V-go 40 basal, mealtime coverage 8 u acB and 8-10 acS  She has been on the V-go pump since 03/2015  Most recent A1c 7%, previously has been as low as 6.4 She did not follow June as instructed  Current blood sugar patterns and problems identified:   Because of frequent overnight hypoglycemia she was told to switch her pump to the 30 unit basal in May  But she is still using the 40 unit pump and she thinks this is because her mail-order supply keeps shipping heard the 40 unit pump, she did not tell us that she needed a new prescription  However her blood sugars are only sporadically known the morning with only a couple of low readings 50s last month compared to readings as low as 41 previously  Has only one reading below 70 in the last 2 weeks in the morning  She is however not checking readings after meals very much and has sporadic readings later in the day  Occasionally will have relatively high readings at bedtime, sometimes will forget to bolus with her evening meal  She is still not able to do much physical activity although his trying to walk a little  May still be benefiting from Trulicity as weight is stable  Oral hypoglycemic drugs the patient is taking are: Trulicity 6.16 mg weekly  Side effects from  medications have been: None  Compliance with the medical regimen: Fair  Glucose monitoring:  done about 3 times a day         Glucometer:  Accu-Chek [employer] Blood Glucose readings by download for the last 4 weeks:  Mean values apply above for all meters except median for One Touch  PRE-MEAL Fasting Lunch Dinner Bedtime Overall  Glucose range:  58-1 75   68-1 54  67-1 69   149-242    Mean/median: 97     111     Self-care: The diet that the patient has been following is: tries to limit fats and carbs.  Eating out periodically She uses diet green tea or diet drinks and no sugar in drinks    Lunch 1-2 pm Dinner 6-7 PM; Hs snack   Typical meal intake: Breakfast is oatrmeal usually              Dietician visit, most recent: 5/16 And in 7/16 with oncology dietitian                Exercise:  PT, some walking  Weight history:  Wt Readings from Last 3 Encounters:  02/20/17 194 lb 6.4 oz (88.2 kg)  01/29/17 194 lb 9.6 oz (88.3 kg)  01/28/17 193 lb 3.2 oz (87.6 kg)    Glycemic  control:      Lab Results  Component Value Date   HGBA1C 7.0 (H) 02/14/2017   HGBA1C 6.9 (H) 10/09/2016   HGBA1C 6.4 05/08/2016   Lab Results  Component Value Date   MICROALBUR 13.3 (H) 02/14/2017   LDLCALC 82 07/10/2016   CREATININE 1.44 (H) 02/14/2017       Allergies as of 02/20/2017      Reactions   Amoxicillin Rash   Has patient had a PCN reaction causing immediate rash, facial/tongue/throat swelling, SOB or lightheadedness with hypotension: No Has patient had a PCN reaction causing severe rash involving mucus membranes or skin necrosis: Yes Has patient had a PCN reaction that required hospitalization No Has patient had a PCN reaction occurring within the last 10 years: No If all of the above answers are "NO", then may proceed with Cephalosporin use.   Imdur [isosorbide Dinitrate] Other (See Comments)   Headache/severe hypotension/Syncope   Latex Itching   Tetracycline Rash        Medication List       Accurate as of 02/20/17  9:53 PM. Always use your most recent med list.          albuterol 108 (90 Base) MCG/ACT inhaler Commonly known as:  PROVENTIL HFA;VENTOLIN HFA Inhale 2 puffs into the lungs every 6 (six) hours as needed for wheezing or shortness of breath.   alendronate 70 MG tablet Commonly known as:  FOSAMAX Take 70 mg by mouth every Wednesday. Take with a full glass of water on an empty stomach.   aspirin 81 MG EC tablet Take 1 tablet (81 mg total) by mouth daily.   clopidogrel 75 MG tablet Commonly known as:  PLAVIX Take 75 mg by mouth daily.   ezetimibe 10 MG tablet Commonly known as:  ZETIA TAKE 1 TABLET BY MOUTH  DAILY   gabapentin 300 MG capsule Commonly known as:  NEURONTIN Take 1 capsule (300 mg total) by mouth 3 (three) times daily.   glucose blood test strip Commonly known as:  ACCU-CHEK GUIDE Use to test blood sugar 3 times daily   insulin lispro 100 UNIT/ML KiwkPen Commonly known as:  HUMALOG KWIKPEN INJECT 10 UNITS INTO THE SKIN WITH MEALS PLUS SLIDING SCALE   insulin lispro 100 UNIT/ML injection Commonly known as:  HUMALOG Inject 76 units daily with  V-GO pump   letrozole 2.5 MG tablet Commonly known as:  FEMARA Take 1 tablet (2.5 mg total) by mouth daily.   loratadine 10 MG tablet Commonly known as:  CLARITIN Take 10 mg by mouth daily.   midodrine 5 MG tablet Commonly known as:  PROAMATINE ONE TABLET BY MOUTH THREE TIMES DAILY WITH MEALS   rosuvastatin 40 MG tablet Commonly known as:  CRESTOR TAKE ONE TABLET BY MOUTH DAILY   sotalol 120 MG tablet Commonly known as:  BETAPACE TAKE ONE TABLET BY MOUTH EVERY TWELVE HOURS   SYMBICORT 160-4.5 MCG/ACT inhaler Generic drug:  budesonide-formoterol INHALE TWO PUFFS BY MOUTH TWICE DAILY   traMADol 200 MG 24 hr tablet Commonly known as:  ULTRAM-ER   TRULICITY 7.86 VE/7.2CN Sopn Generic drug:  Dulaglutide Inject 0.75 mg into the skin every Wednesday.   V-GO  30 Kit Use 1 per day       Allergies:  Allergies  Allergen Reactions  . Amoxicillin Rash    Has patient had a PCN reaction causing immediate rash, facial/tongue/throat swelling, SOB or lightheadedness with hypotension: No Has patient had a PCN reaction causing severe rash involving mucus  membranes or skin necrosis: Yes Has patient had a PCN reaction that required hospitalization No Has patient had a PCN reaction occurring within the last 10 years: No If all of the above answers are "NO", then may proceed with Cephalosporin use.   . Imdur [Isosorbide Dinitrate] Other (See Comments)    Headache/severe hypotension/Syncope  . Latex Itching  . Tetracycline Rash    Past Medical History:  Diagnosis Date  . Abnormally small mouth   . Anemia   . Bilateral carotid artery stenosis    Bilateral ICA 40-59%/  >50% LECA  . Breast cancer ([place]) 01/18/2016   right breast  . Breast cancer of upper-inner quadrant of right female breast ([place]) 12/16/2015  . CAD (coronary artery disease) cardiologist-  dr Aundra Dubin   Remote CABG in 1998 with redo in 2009  . CHF (congestive heart failure) (HCC)    EF is low normal at 50 to 55% per echo in Jan. 2011  . GERD (gastroesophageal reflux disease)   . Hearing loss of right ear   . History of non-ST elevation myocardial infarction (NSTEMI)    Sept 2014--  thought to be type II HTN w/ LHC without infarct related artery and patent grafts  . History of radiation therapy 05/24/16-07/26/16   right breast 50.4 Gy in 28 fractions, right breast boost 10 Gy in 5 fractions  . Hyperlipidemia   . Hypertension    Resolved.  . Iron deficiency anemia   . Moderate persistent asthma    pulmologist-  Dr. Malvin Johns  . Nonischemic cardiomyopathy ([place])   . PAF (paroxysmal atrial fibrillation) ([place])   . Personal history of chemotherapy 2017  . Personal history of radiation therapy 2017  . Psoriasis    right leg  . Renal calculus, right   . Renal insufficiency, mild   . S/P  AVR    prosthesis valve placement 2009 at same time re-do CABG  . 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 Type 2 diabetes mellitus ([place])    monitored by dr Dwyane Dee    Past Surgical History:  Procedure Laterality Date  . AORTIC VALVE REPLACEMENT  2009   [facility]done same time is [facility]G)  . BREAST LUMPECTOMY Right 01/18/2016  . BREAST LUMPECTOMY WITH RADIOACTIVE SEED AND SENTINEL LYMPH NODE BIOPSY Right 01/18/2016   Procedure: RIGHT BREAST LUMPECTOMY WITH RADIOACTIVE SEED AND SENTINEL LYMPH NODE BIOPSY;  Surgeon: DAlphonsa Overall MD;  [place]  Service: General;  Laterality: Right;  . CARDIAC CATHETERIZATION  03/23/2008   Pre-redo CABG: L main OK, LAD (T), CFX (T), OM1 99%, RCA (T), LIMA-LAD OK, SVG-OM(?3) OK w/ little florw to OM2, SVG-RCA OK. EF NL  . CARPAL TUNNEL RELEASE    . COLONOSCOPY    . CORONARY ARTERY BYPASS GRAFT  1998 &  re-do 2009   Had LIMA to DX/LAD, SVG to 2 marginal branches and [facility]; SVG to 3rd OM and PD at time of redo  . CYSTOSCOPY W/ URETERAL STENT PLACEMENT Right 12/20/2014   Procedure: CYSTOSCOPY WITH RETROGRADE PYELOGRAM/URETERAL STENT PLACEMENT;  Surgeon: PCleon Gustin MD;  [facility]  Service: Urology;  Laterality: Right;  . EYE SURGERY Bilateral    cataracts  . HOLMIUM LASER APPLICATION Right 72/23/3612  Procedure:  HOLMIUM LASER LITHOTRIPSY;  Surgeon: PCleon Gustin MD;  [facility]  Service: Urology;  Laterality: Right;  . LEFT HEART CATHETERIZATION WITH CORONARY/GRAFT  ANGIOGRAM N/A 02/23/2013   Procedure: LEFT HEART CATHETERIZATION WITH Beatrix Fetters;  Surgeon: Blane Ohara, MD;  [facility];  Service: Cardiovascular;  Laterality: N/A;  . PORTACATH PLACEMENT Left 01/18/2016   Procedure: INSERTION PORT-A-CATH;  Surgeon: Alphonsa Overall, MD;  Location: [place];  Service: General;  Laterality: Left;  . TONSILLECTOMY    .  TRANSTHORACIC ECHOCARDIOGRAM  02-24-2013      mild LVH,  ef 50-55%/  AV bioprosthesis was present with very mild stenosis and no regurg., mean grandient 36mHg, peak grandient 269mg /  mild MR/  mild LAE and RAE/  moderate TR  . TUBAL LIGATION      Family History  Problem Relation Age of Onset  . Heart disease Father   . Heart failure Father   . Diabetes Maternal Grandmother   . Heart disease Maternal Grandmother   . Diabetes Son   . Healthy Brother        #1  . Heart attack Brother        #2  . Heart disease Brother        #2    Social History:  reports that she has never smoked. She has never used smokeless tobacco. She reports that she does not drink alcohol or use drugs.    Review of Systems    Lipid history: On 40 mg Crestor along with Zetia  To have follow-up with cardiologist soon    Lab Results  Component Value Date   CHOL 137 07/10/2016   HDL 33.70 (L) 07/10/2016   LDLCALC 82 07/10/2016   LDLDIRECT 53.0 10/08/2016   TRIG 107.0 07/10/2016   CHOLHDL 4 07/10/2016           Eyes:  No history of retinopathy   She is followed by nephrologist for mild renal dysfunction, etiology unclear  Diabetic foot exam in 5/16: She has complete loss of monofilament sensation in her distal feet with only mild sensitivity present on the right first and second toes   Physical Examination:  BP 114/68   Pulse 80   Ht 5' 5"  (1.651 m)   Wt 194 lb 6.4 oz (88.2 kg)   SpO2 94%   BMI 32.35 kg/m    ASSESSMENT:  Diabetes type 2, uncontrolled   See history of present illness for detailed discussion of  current management, blood sugar patterns and problems identified  Although her A1c is 7% blood sugars averaging only 111 at home May be having high readings after meals which she is not monitoring much, highest reading 242 at bedtime Also on Trulicity  RENAL dysfunction: Stable and she has no microalbuminuria  PLAN:     For now will continue the 40 unit pump for now  since she is not having frequent or excessive hyperglycemia  However she will call usKoreaf she is starting to wake up with any low readings, will switch to 30 unit pump at that point  Encouraged her to bolus consistently each meal  Also need to check more readings after meals  Recommended that she have lipids checked by her cardiologist  Patient Instructions  More sugars after lunch        Adyan Palau 02/20/2017, 9:53 PM   Note: This office note was prepared with Dragon voice recognition system technology. Any transcriptional errors that result from this process are unintentional.

## 2017-02-26 ENCOUNTER — Ambulatory Visit (INDEPENDENT_AMBULATORY_CARE_PROVIDER_SITE_OTHER): Payer: Medicare Other | Admitting: Podiatry

## 2017-02-26 ENCOUNTER — Encounter: Payer: Self-pay | Admitting: Podiatry

## 2017-02-26 DIAGNOSIS — E1149 Type 2 diabetes mellitus with other diabetic neurological complication: Secondary | ICD-10-CM

## 2017-02-26 DIAGNOSIS — M2041 Other hammer toe(s) (acquired), right foot: Secondary | ICD-10-CM | POA: Diagnosis not present

## 2017-02-26 DIAGNOSIS — L84 Corns and callosities: Secondary | ICD-10-CM

## 2017-02-26 DIAGNOSIS — M2042 Other hammer toe(s) (acquired), left foot: Secondary | ICD-10-CM | POA: Diagnosis not present

## 2017-02-26 NOTE — Progress Notes (Signed)
Subjective: Ms. Candler presents today  for follow-up evaluation of a localized infection, pre-ulcerative callus the right fifth toe. She states that she is doing better but the toe is still sore. She denies any swelling or redness the toe. She finished her course of antibiotics. She's been wearing open toed shoe although not a surgical shoe. She has no new concerns today. Denies any systemic complaints such as fevers, chills, nausea, vomiting. No acute changes since last appointment, and no other complaints at this time.   Objective: AAO x3, NAD DP/PT pulses palpable bilaterally, CRT less than 3 seconds Adductovarus is present in the fifth digits bilaterally. On the PIPJ on the medial aspect of the right fifth toe is a hyperkeratotic lesion. Upon debrided there is no underlying ulceration, drainage or any clinical signs of infection. There is slight erythema from irritation from where the toes of been rubbing, the fourth and fifth toes but there is no swelling, increased warmth, ascending cellulitis. There is no fluctuance or crepitus. There is no malodor. There is slight irritation present along the medial aspect of the fifth toe on the left foot as well but is no hyperkeratotic tissue or skin breakdown. No open lesions or pre-ulcerative lesions.  No pain with calf compression, swelling, warmth, erythema  Assessment: Pre-ulcerative lesion right fifth toe with underlying digital deformity, diabetes with neuropathy  Plan: -All treatment options discussed with the patient including all alternatives, risks, complications.  -Sharp debridement the hyperkeratotic lesion of the right fifth toe sooner any complications. There is no underlying ulceration. There is no signs of infection but there is evidence of skin irritation present. I dispensed an offloading pad to apply to digitally between the fourth and fifth digits bilaterally. Discussed that her monitor feet daily for any skin breakdown. -At this  point I'll follow with her in 3 months for routine care by discussed with her call the office if there is any worsening of symptoms or any reoccurrence. -Patient encouraged to call the office with any questions, concerns, change in symptoms.   Celesta Gentile, DPM

## 2017-02-28 ENCOUNTER — Other Ambulatory Visit: Payer: Self-pay | Admitting: Hematology and Oncology

## 2017-02-28 DIAGNOSIS — C50211 Malignant neoplasm of upper-inner quadrant of right female breast: Secondary | ICD-10-CM

## 2017-03-06 ENCOUNTER — Other Ambulatory Visit: Payer: Self-pay | Admitting: Nurse Practitioner

## 2017-03-11 ENCOUNTER — Other Ambulatory Visit: Payer: Self-pay | Admitting: Hematology and Oncology

## 2017-03-11 DIAGNOSIS — C50211 Malignant neoplasm of upper-inner quadrant of right female breast: Secondary | ICD-10-CM

## 2017-03-13 ENCOUNTER — Encounter: Payer: Self-pay | Admitting: Internal Medicine

## 2017-03-13 ENCOUNTER — Ambulatory Visit (INDEPENDENT_AMBULATORY_CARE_PROVIDER_SITE_OTHER): Payer: Medicare Other | Admitting: Internal Medicine

## 2017-03-13 VITALS — BP 110/58 | HR 88 | Ht 65.0 in | Wt 194.6 lb

## 2017-03-13 DIAGNOSIS — R55 Syncope and collapse: Secondary | ICD-10-CM

## 2017-03-13 DIAGNOSIS — I951 Orthostatic hypotension: Secondary | ICD-10-CM

## 2017-03-13 NOTE — Progress Notes (Signed)
HPI Hannah [name] returns today for ongoing evaluation and management of syncope due to orthostasis. She is a 71yo woman with a h/o CAD, syncope, due to orthostasis. She has not passed out since I saw her last. She has tolerated midodrine.  Allergies  Allergen Reactions  . Amoxicillin Rash    Has patient had a PCN reaction causing immediate rash, facial/tongue/throat swelling, SOB or lightheadedness with hypotension: No Has patient had a PCN reaction causing severe rash involving mucus membranes or skin necrosis: Yes Has patient had a PCN reaction that required hospitalization No Has patient had a PCN reaction occurring within the last 10 years: No If all of the above answers are "NO", then may proceed with Cephalosporin use.   . Imdur [Isosorbide Dinitrate] Other (See Comments)    Headache/severe hypotension/Syncope  . Latex Itching  . Tetracycline Rash     Current Outpatient Prescriptions  Medication Sig Dispense Refill  . albuterol (PROVENTIL HFA;VENTOLIN HFA) 108 (90 Base) MCG/ACT inhaler Inhale 2 puffs into the lungs every 6 (six) hours as needed for wheezing or shortness of breath.    Marland Kitchen alendronate (FOSAMAX) 70 MG tablet Take 70 mg by mouth every Wednesday. Take with a full glass of water on an empty stomach.     Marland Kitchen aspirin EC 81 MG EC tablet Take 1 tablet (81 mg total) by mouth daily.    . clopidogrel (PLAVIX) 75 MG tablet Take 75 mg by mouth daily.    Marland Kitchen ezetimibe (ZETIA) 10 MG tablet TAKE 1 TABLET BY MOUTH  DAILY 90 tablet 2  . glucose blood (ACCU-CHEK GUIDE) test strip Use to test blood sugar 3 times daily 100 each 5  . Insulin Disposable Pump (V-GO 30) KIT Use 1 per day 90 kit 1  . insulin lispro (HUMALOG KWIKPEN) 100 UNIT/ML KiwkPen INJECT 10 UNITS INTO THE SKIN WITH MEALS PLUS SLIDING SCALE 15 mL 2  . insulin lispro (HUMALOG) 100 UNIT/ML injection Inject 76 units daily with  V-GO pump 70 mL 3  . letrozole (FEMARA) 2.5 MG tablet Take 1 tablet (2.5 mg total) by mouth  daily. 90 tablet 3  . loratadine (CLARITIN) 10 MG tablet Take 10 mg by mouth daily.    . midodrine (PROAMATINE) 5 MG tablet ONE TABLET BY MOUTH THREE TIMES DAILY WITH MEALS 90 tablet 6  . NEURONTIN 300 MG capsule TAKE 1 CAPSULE BY MOUTH 3  TIMES DAILY 90 capsule 0  . ondansetron (ZOFRAN) 8 MG tablet TAKE 1 TABLET BY MOUTH 2  TIMES DAILY AS NEEDED FOR  REFRACTORY NAUSEA /  VOMITING. START ON DAY 3  AFTER CHEMO. 60 tablet 0  . prochlorperazine (COMPAZINE) 10 MG tablet TAKE 1 TABLET BY MOUTH  EVERY 6 HOURS AS NEEDED FOR NAUSEA OR VOMITING 60 tablet 0  . rosuvastatin (CRESTOR) 40 MG tablet TAKE ONE TABLET BY MOUTH DAILY 90 tablet 3  . sotalol (BETAPACE) 120 MG tablet Take 1 tablet (120 mg total) by mouth every 12 (twelve) hours. 180 tablet 1  . SYMBICORT 160-4.5 MCG/ACT inhaler INHALE TWO PUFFS BY MOUTH TWICE DAILY 10.2 g 2  . traMADol (ULTRAM-ER) 200 MG 24 hr tablet     . TRULICITY 8.29 FA/2.1HY SOPN Inject 0.75 mg into the skin every Wednesday. 12 pen 2   No current facility-administered medications for this visit.      Past Medical History:  Diagnosis Date  . Abnormally small mouth   . Anemia   . Bilateral carotid artery stenosis  Bilateral ICA 40-59%/  >50% LECA  . Breast cancer ([place]) 01/18/2016   right breast  . Breast cancer of upper-inner quadrant of right female breast ([place]) 12/16/2015  . CAD (coronary artery disease) cardiologist-  dr Aundra Dubin   Remote CABG in 1998 with redo in 2009  . CHF (congestive heart failure) (HCC)    EF is low normal at 50 to 55% per echo in Jan. 2011  . GERD (gastroesophageal reflux disease)   . Hearing loss of right ear   . History of non-ST elevation myocardial infarction (NSTEMI)    Sept 2014--  thought to be type II HTN w/ LHC without infarct related artery and patent grafts  . History of radiation therapy 05/24/16-07/26/16   right breast 50.4 Gy in 28 fractions, right breast boost 10 Gy in 5 fractions  . Hyperlipidemia   . Hypertension     Resolved.  . Iron deficiency anemia   . Moderate persistent asthma    pulmologist-  Dr. Malvin Johns  . Nonischemic cardiomyopathy ([place])   . PAF (paroxysmal atrial fibrillation) ([place])   . Personal history of chemotherapy 2017  . Personal history of radiation therapy 2017  . Psoriasis    right leg  . Renal calculus, right   . Renal insufficiency, mild   . S/P AVR    prosthesis valve placement 2009 at same time re-do CABG  . Stroke [facility])    residual rt hearing loss  . Type 2 diabetes mellitus ([place])    monitored by dr Dwyane Dee    ROS:   All systems reviewed and negative except as noted in the HPI.   Past Surgical History:  Procedure Laterality Date  . AORTIC VALVE REPLACEMENT  2009   [facility] (done same time is CABG)  . BREAST LUMPECTOMY Right 01/18/2016  . BREAST LUMPECTOMY WITH RADIOACTIVE SEED AND SENTINEL LYMPH NODE BIOPSY Right 01/18/2016   Procedure: RIGHT BREAST LUMPECTOMY WITH RADIOACTIVE SEED AND SENTINEL LYMPH NODE BIOPSY;  Surgeon: DAlphonsa Overall MD;  [place]  Service: General;  Laterality: Right;  . CARDIAC CATHETERIZATION  03/23/2008   Pre-redo CABG: L main OK, LAD (T), CFX (T), OM1 99%, RCA (T), LIMA-LAD OK, SVG-OM(?3) OK w/ little florw to OM2, SVG-RCA OK. EF NL  . CARPAL TUNNEL RELEASE    . COLONOSCOPY    . CORONARY ARTERY BYPASS GRAFT  1998 &  re-do 2009   Had LIMA to DX/LAD, SVG to 2 marginal branches and [facility]; SVG to 3rd OM and PD at time of redo  . CYSTOSCOPY W/ URETERAL STENT PLACEMENT Right 12/20/2014   Procedure: CYSTOSCOPY WITH RETROGRADE PYELOGRAM/URETERAL STENT PLACEMENT;  Surgeon: PCleon Gustin MD;  [facility]  Service: Urology;  Laterality: Right;  . EYE SURGERY Bilateral    cataracts  . HOLMIUM LASER APPLICATION Right 79/50/9326  Procedure:  HOLMIUM LASER LITHOTRIPSY;  Surgeon: PCleon Gustin MD;  [facility]  Service: Urology;  Laterality:  Right;  . LEFT HEART CATHETERIZATION WITH CORONARY/GRAFT ANGIOGRAM N/A 02/23/2013   Procedure: LEFT HEART CATHETERIZATION WITH CBeatrix Fetters  Surgeon: MBlane Ohara MD;  [facility];  Service: Cardiovascular;  Laterality: N/A;  . PORTACATH PLACEMENT Left 01/18/2016   Procedure: INSERTION PORT-A-CATH;  Surgeon: DAlphonsa Overall MD;  [place]  Service: General;  Laterality: Left;  . TONSILLECTOMY    . TRANSTHORACIC ECHOCARDIOGRAM  02-24-2013      mild LVH,  ef  50-55%/  AV bioprosthesis was present with very mild stenosis and no regurg., mean grandient 78mHg, peak grandient 26mg /  mild MR/  mild LAE and RAE/  moderate TR  . TUBAL LIGATION       Family History  Problem Relation Age of Onset  . Heart disease Father   . Heart failure Father   . Diabetes Maternal Grandmother   . Heart disease Maternal Grandmother   . Diabetes Son   . Healthy Brother        #1  . Heart attack Brother        #2  . Heart disease Brother        #2     Social History   Social History  . Marital status: Married    Spouse name: N/A  . Number of children: 2  . Years of education: N/A   Occupational History  . retired    Social History Main Topics  . Smoking status: Never Smoker  . Smokeless tobacco: Never Used  . Alcohol use No  . Drug use: No  . Sexual activity: Yes    Birth control/ protection: None   Other Topics Concern  . Not on file   Social History Narrative  . No narrative on file     BP (!) 110/58   Pulse 88   Ht 5' 5"  (1.651 m)   Wt 194 lb 9.6 oz (88.3 kg)   SpO2 97%   BMI 32.38 kg/m   Physical Exam:  Well appearing [age]o woman, NAD HEENT: Unremarkable Neck:  No JVD, no thyromegally Lymphatics:  No adenopathy Back:  No CVA tenderness Lungs:  Clear HEART:  Regular rate rhythm, no murmurs, no rubs, no clicks Abd:  soft, positive bowel sounds, no organomegally, no rebound, no guarding Ext:  2 plus pulses, no edema, no cyanosis, no  clubbing Skin:  No rashes no nodules Neuro:  CN II through XII intact, motor grossly intact  EKG - NSR  With first degree AV block.   Assess/Plan: 1. Orthostasis - her symptoms are controlled. She will continue her current meds. Hopefully she can avoid additional syncope. 2. CAD - she has not had angina. She will continue her current meds. 3. Atrial arrhythmias - she has had none. Ideally we could stop her sotalol. If her symptoms worsen, or if she has more orthostasis, then we would consider stopping sotalol and using amiodarone.  GrMikle Bosworth.

## 2017-03-13 NOTE — Patient Instructions (Signed)
Medication Instructions:  Your physician recommends that you continue on your current medications as directed. Please refer to the Current Medication list given to you today.  Labwork: None ordered.  Testing/Procedures: None ordered.  Follow-Up: Your physician wants you to follow-up in: one year with Dr. Taylor.   You will receive a reminder letter in the mail two months in advance. If you don't receive a letter, please call our office to schedule the follow-up appointment.  Any Other Special Instructions Will Be Listed Below (If Applicable).  If you need a refill on your cardiac medications before your next appointment, please call your pharmacy.    

## 2017-03-15 ENCOUNTER — Ambulatory Visit: Payer: Medicare Other | Admitting: Internal Medicine

## 2017-03-18 ENCOUNTER — Encounter: Payer: Self-pay | Admitting: Nurse Practitioner

## 2017-03-19 ENCOUNTER — Telehealth: Payer: Self-pay | Admitting: *Deleted

## 2017-03-19 NOTE — Telephone Encounter (Signed)
S/w pt due to pt having appt next week with [name] and saw Dr. Lovena Le last week.  Pt is doing ok, was in hospital in Jan and Feb and stated this appt was made back than and Dr. Lovena Le was assigned to pt. Stated HR was irregular and HR was low, pt kept passing out. Pt stated was taken off metoprolol.   Moved pt's appt to December.  Will send to [place] to [place].

## 2017-03-19 NOTE — Telephone Encounter (Signed)
Noted.  Will need EKG on return visit please.  [name]

## 2017-03-25 ENCOUNTER — Other Ambulatory Visit: Payer: Self-pay | Admitting: Nurse Practitioner

## 2017-03-25 MED ORDER — CLOPIDOGREL BISULFATE 75 MG PO TABS: 75.0000 mg | ORAL_TABLET | Freq: Every day | ORAL | 1 refills | Status: DC

## 2017-03-26 ENCOUNTER — Other Ambulatory Visit: Payer: Self-pay | Admitting: Nurse Practitioner

## 2017-03-27 ENCOUNTER — Ambulatory Visit: Payer: Medicare Other | Admitting: Nurse Practitioner

## 2017-03-28 ENCOUNTER — Ambulatory Visit: Payer: Medicare Other | Admitting: Endocrinology

## 2017-04-04 ENCOUNTER — Telehealth: Payer: Self-pay | Admitting: Hematology and Oncology

## 2017-04-04 ENCOUNTER — Ambulatory Visit (HOSPITAL_BASED_OUTPATIENT_CLINIC_OR_DEPARTMENT_OTHER): Payer: Medicare Other | Admitting: Hematology and Oncology

## 2017-04-04 DIAGNOSIS — R42 Dizziness and giddiness: Secondary | ICD-10-CM

## 2017-04-04 DIAGNOSIS — C50211 Malignant neoplasm of upper-inner quadrant of right female breast: Secondary | ICD-10-CM | POA: Diagnosis not present

## 2017-04-04 DIAGNOSIS — Z170 Estrogen receptor positive status [ER+]: Secondary | ICD-10-CM

## 2017-04-04 MED ORDER — ANASTROZOLE 1 MG PO TABS: 1.0000 mg | ORAL_TABLET | Freq: Every day | ORAL | 0 refills | Status: DC

## 2017-04-04 NOTE — Telephone Encounter (Signed)
Patient declined AVS and calendar of upcoming 09/2017 appointments.

## 2017-04-04 NOTE — Assessment & Plan Note (Signed)
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 I renewed her medication today.  Treatment plan: 1. Adjuvant chemotherapy with Taxotere and Cytoxan (3cycles). Adriamycin was not given because of her cardiac problems, Received 3 cycles of TC 02/17/16 to 04/06/16 2. Adjuvant radiation 05/24/16 to 07/26/16 3 followed by adjuvant antiestrogen therapy (because a satellite tumor is weakly ER and PR positive). Started 08/23/2016 ---------------------------------------------------------------------------------- Current treatment: Anti estrogen therapy with letrozole 2.5 mg daily.started 08/23/2016 If she is unable to tolerate antiestrogen therapy then I would immediately discontinue it because she is only weak ER and PR positivity  Letrozole toxicities: Denies any hot flashes or myalgias.  Return to clinic in 1 year for toxicity check and follow-up.

## 2017-04-04 NOTE — Progress Notes (Signed)
Patient Care Team: Saguier, Iris Pert as PCP - General (Internal Medicine) Alphonsa Overall, MD as Consulting Physician (General Surgery) Nicholas Lose, MD as Consulting Physician (Hematology and Oncology) Gery Pray, MD as Consulting Physician (Radiation Oncology) Evans Lance, MD as Consulting Physician (Cardiology) Collene Gobble, MD as Consulting Physician (Pulmonary Disease) Elayne Snare, MD as Consulting Physician (Endocrinology) Delice Bison Charlestine Massed, NP as Nurse Practitioner (Hematology and Oncology)  DIAGNOSIS:  Encounter Diagnosis  Name Primary?  . 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%; satellite mass 8 mm: IDC grade 2 ER 5%, PR 5%, HER-2 negative, Ki-67 50%; T1cN0 stage IA clinical stage      01/18/2016 Surgery    Right lumpectomy: Multifocal IDC grade 3, 1.9 cm  ER 0%, PR 0%, HER-2 negative, Ki-67 70% and 0.8 cm satellite mass ER 5%, PR 2%, HER-2 negative, Ki-67 50%, high-grade DCIS, margins negative, 0/1 lymph nodes negative T1 cN0 stage IA      02/17/2016 - 04/06/2016 Chemotherapy    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       04/08/2016 - 04/23/2016 Hospital Admission    Hosp adm for cellulitis      05/24/2016 - 07/25/2016 Radiation Therapy     Adj XRT      06/29/2016 - 07/04/2016 Hospital Admission    Seizure like activity, MRI brain and EEG unremarkable       08/16/2016 -  Anti-estrogen oral therapy    Letrozole could not tolerate it due to dizziness and lightheadedness, switched to anastrozole 04/04/2017       CHIEF COMPLIANT: Unable to tolerate letrozole  INTERVAL  HISTORY: Hannah [name] is a 71 year old with above-mentioned history of right breast cancer treated with lumpectomy and adjuvant chemotherapy and radiation.  She was taking letrozole and felt significantly dizzy and lightheaded and stopped taking it a week ago and since then her symptoms went away.  She is here to talk about alternative strategies estrogen therapy with letrozole.  She did not have any hot flashes or myalgias.  REVIEW OF SYSTEMS:   Constitutional: Denies fevers, chills or abnormal weight loss Eyes: Denies blurriness of vision Ears, nose, mouth, throat, and face: Denies mucositis or sore throat Respiratory: Denies cough, dyspnea or wheezes Cardiovascular: Denies palpitation, chest discomfort Gastrointestinal:  Denies nausea, heartburn or change in bowel habits Skin: Denies abnormal skin rashes Lymphatics: Denies new lymphadenopathy or easy bruising Neurological: Dizziness and lightheadedness improved after stopping letrozole Behavioral/Psych: Mood is stable, no new changes  Extremities: No lower extremity edema Breast:  denies any pain or lumps or nodules in either breasts All other systems were reviewed with the patient and are negative.  I have reviewed the past medical history, past surgical history, social history and family history with the patient and they are unchanged from previous note.  ALLERGIES:  is allergic to amoxicillin; imdur [isosorbide dinitrate]; latex; and tetracycline.  MEDICATIONS:  Current Outpatient Prescriptions  Medication Sig Dispense Refill  . albuterol (PROVENTIL HFA;VENTOLIN HFA) 108 (90 Base) MCG/ACT inhaler Inhale 2 puffs into the lungs every 6 (six) hours as needed for wheezing or shortness of breath.    Marland Kitchen  alendronate (FOSAMAX) 70 MG tablet Take 70 mg by mouth every Wednesday. Take with a full glass of water on an empty stomach.     Marland Kitchen anastrozole (ARIMIDEX) 1 MG tablet Take 1 tablet (1 mg total) by mouth daily. 30 tablet 0  . aspirin EC 81  MG EC tablet Take 1 tablet (81 mg total) by mouth daily.    . clopidogrel (PLAVIX) 75 MG tablet Take 1 tablet (75 mg total) by mouth daily. 90 tablet 1  . ezetimibe (ZETIA) 10 MG tablet TAKE 1 TABLET BY MOUTH  DAILY 90 tablet 2  . glucose blood (ACCU-CHEK GUIDE) test strip Use to test blood sugar 3 times daily 100 each 5  . Insulin Disposable Pump (V-GO 30) KIT Use 1 per day 90 kit 1  . insulin lispro (HUMALOG KWIKPEN) 100 UNIT/ML KiwkPen INJECT 10 UNITS INTO THE SKIN WITH MEALS PLUS SLIDING SCALE 15 mL 2  . insulin lispro (HUMALOG) 100 UNIT/ML injection Inject 76 units daily with  V-GO pump 70 mL 3  . loratadine (CLARITIN) 10 MG tablet Take 10 mg by mouth daily.    . midodrine (PROAMATINE) 5 MG tablet ONE TABLET BY MOUTH THREE TIMES DAILY WITH MEALS 90 tablet 6  . NEURONTIN 300 MG capsule TAKE 1 CAPSULE BY MOUTH 3  TIMES DAILY 90 capsule 0  . rosuvastatin (CRESTOR) 40 MG tablet TAKE ONE TABLET BY MOUTH DAILY 90 tablet 3  . sotalol (BETAPACE) 120 MG tablet Take 1 tablet (120 mg total) by mouth every 12 (twelve) hours. 180 tablet 1  . SYMBICORT 160-4.5 MCG/ACT inhaler INHALE TWO PUFFS BY MOUTH TWICE DAILY 10.2 g 2  . traMADol (ULTRAM-ER) 200 MG 24 hr tablet     . TRULICITY 7.98 XQ/1.1HE SOPN Inject 0.75 mg into the skin every Wednesday. 12 pen 2   No current facility-administered medications for this visit.     PHYSICAL EXAMINATION: ECOG PERFORMANCE STATUS: 1 - Symptomatic but completely ambulatory  Vitals:   04/04/17 1505  BP: (!) 105/56  Pulse: 90  Resp: 18  Temp: 98.1 F (36.7 C)  SpO2: 100%   Filed Weights   04/04/17 1505  Weight: 195 lb (88.5 kg)    GENERAL:alert, no distress and comfortable SKIN: skin color, texture, turgor are normal, no rashes or significant lesions EYES: normal, Conjunctiva are pink and non-injected, sclera clear OROPHARYNX:no exudate, no erythema and lips, buccal mucosa, and tongue normal  NECK: supple, thyroid normal size, non-tender, without  nodularity LYMPH:  no palpable lymphadenopathy in the cervical, axillary or inguinal LUNGS: clear to auscultation and percussion with normal breathing effort HEART: regular rate & rhythm and no murmurs and no lower extremity edema ABDOMEN:abdomen soft, non-tender and normal bowel sounds MUSCULOSKELETAL:no cyanosis of digits and no clubbing  NEURO: alert & oriented x 3 with fluent speech, no focal motor/sensory deficits EXTREMITIES: No lower extremity edema  LABORATORY DATA:  I have reviewed the data as listed   Chemistry      Component Value Date/Time   NA 141 02/14/2017 0816   NA 143 06/15/2016 1206   NA 143 04/27/2016 1352   K 4.6 02/14/2017 0816   K 4.1 04/27/2016 1352   CL 103 02/14/2017 0816   CO2 34 (H) 02/14/2017 0816   CO2 29 04/27/2016 1352   BUN 22 02/14/2017 0816   BUN 37 (H) 06/15/2016 1206   BUN 29.5 (H) 04/27/2016 1352   CREATININE 1.44 (H) 02/14/2017 0816   CREATININE 1.66 (H) 05/23/2016 1143   CREATININE  1.3 (H) 04/27/2016 1352      Component Value Date/Time   CALCIUM 9.5 02/14/2017 0816   CALCIUM 8.9 04/27/2016 1352   ALKPHOS 50 02/14/2017 0816   ALKPHOS 62 04/27/2016 1352   AST 23 02/14/2017 0816   AST 28 04/27/2016 1352   ALT 13 02/14/2017 0816   ALT 24 04/27/2016 1352   BILITOT 0.7 02/14/2017 0816   BILITOT 0.81 04/27/2016 1352       Lab Results  Component Value Date   WBC 6.4 01/29/2017   HGB 10.3 (L) 01/29/2017   HCT 31.2 (L) 01/29/2017   MCV 91.4 01/29/2017   PLT 144.0 (L) 01/29/2017   NEUTROABS 4.2 01/29/2017    ASSESSMENT & PLAN:  Breast cancer of upper-inner quadrant of right female breast ([place]) Right breast biopsy 12:30 position 12/14/2015: 2 masses, 1.6 cm mass: Invasive ductal carcinoma, grade 2, ER 0%, PR 0%, HER-2 negative, Ki-67 70%; satellite mass 8 mm: IDC grade 2 ER 5%, PR 5%, HER-2 negative, Ki-67 50%; T1cN0 stage IA clinical stage  Right lumpectomy 01/18/2016: Multifocal IDC grade 3, 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 I renewed her medication today.  Treatment plan: 1. Adjuvant chemotherapy with Taxotere and Cytoxan (3cycles). Adriamycin was not given because of her cardiac problems, Received 3 cycles of TC 02/17/16 to 04/06/16 2. Adjuvant radiation 05/24/16 to 07/26/16 3 followed by adjuvant antiestrogen therapy (because a satellite tumor is weakly ER and PR positive). Started 08/23/2016 ---------------------------------------------------------------------------------- Current treatment: Anti estrogen therapy with letrozole 2.5 mg daily.started 08/23/2016 If she is unable to tolerate antiestrogen therapy then I would immediately discontinue it because she is only weak ER and PR positivity  Letrozole toxicities: Dizziness and lightheadedness to letrozole.  This was discontinued and she was switched to anastrozole 04/04/2017. I sent her 30-day supply.  If she tolerates it well then she will call us back and we will send her a 90-day supply.  Return to clinic in 6 months for follow-up.  I spent 25 minutes talking to the patient of which more than half was spent in counseling and coordination of care.  No orders of the defined types were placed in this encounter.  The patient has a good understanding of the overall plan. she agrees with it. she will call with any problems that may develop before the next visit here.   Rulon Eisenmenger, MD 04/04/17

## 2017-04-05 ENCOUNTER — Telehealth: Payer: Self-pay | Admitting: Internal Medicine

## 2017-04-05 MED ORDER — AMIODARONE HCL 100 MG PO TABS: 100.0000 mg | ORAL_TABLET | Freq: Every day | ORAL | 0 refills | Status: DC

## 2017-04-05 NOTE — Telephone Encounter (Signed)
Per Dr. Lovena Le, have Pt stop sotalol.  Have Pt start amiodarone 100 mg one tablet by mouth daily. Notified Pt of drug change.  Pt indicates understanding.  Notified Pt to give drug a week or two and call office if any problems.  Pt indicates understanding,  Will try amiodarone.

## 2017-04-05 NOTE — Telephone Encounter (Signed)
New message   This just started happening again in the last couple of weeks   Pt c/o Syncope: STAT if syncope occurred within 30 minutes and pt complains of lightheadedness High Priority if episode of passing out, completely, today or in last 24 hours   1. Did you pass out today? no  2. When is the last time you passed out?  Last night    3. Has this occurred multiple times? Twice recently   4. Did you have any symptoms prior to passing out?  She starts shaking for a couple seconds then she is on the floor

## 2017-04-12 ENCOUNTER — Telehealth: Payer: Self-pay | Admitting: Internal Medicine

## 2017-04-12 ENCOUNTER — Other Ambulatory Visit: Payer: Self-pay

## 2017-04-12 MED ORDER — GABAPENTIN 300 MG PO CAPS: 300.0000 mg | ORAL_CAPSULE | Freq: Three times a day (TID) | ORAL | 0 refills | Status: DC

## 2017-04-12 MED ORDER — AMIODARONE HCL 200 MG PO TABS: 200.0000 mg | ORAL_TABLET | Freq: Every day | ORAL | 3 refills | Status: DC

## 2017-04-12 NOTE — Telephone Encounter (Signed)
Per Dr. Lovena Le, increase amiodarone to 200 mg daily.  Call placed to Pt, notified of new instruction.  Pt indicates understanding.  Instructed to call if palpitations continued or increased.

## 2017-04-12 NOTE — Telephone Encounter (Signed)
New Message     Pt c/o medication issue:  1. Name of Medication:  amiodarone (PACERONE) 100 MG tablet Take 1 tablet (100 mg total) by mouth daily     2. How are you currently taking this medication (dosage and times per day)?  Pharmacy gave her 200mg  and she is cutting it in half.    3. Are you having a reaction (difficulty breathing--STAT)? yes  4. What is your medication issue? Having palpitations several times a day

## 2017-04-15 ENCOUNTER — Ambulatory Visit: Payer: Medicare Other | Admitting: Neurology

## 2017-04-18 ENCOUNTER — Ambulatory Visit: Payer: Medicare Other | Admitting: Neurology

## 2017-04-18 ENCOUNTER — Telehealth: Payer: Self-pay

## 2017-04-18 NOTE — Telephone Encounter (Signed)
Verified prescription refill with [facility] for Gabapentin script.  Cyndia Bent RN

## 2017-04-23 ENCOUNTER — Other Ambulatory Visit: Payer: Self-pay | Admitting: Internal Medicine

## 2017-04-23 ENCOUNTER — Telehealth: Payer: Self-pay

## 2017-04-23 MED ORDER — AMIODARONE HCL 200 MG PO TABS: 200.0000 mg | ORAL_TABLET | Freq: Every day | ORAL | 3 refills | Status: DC

## 2017-04-23 NOTE — Telephone Encounter (Signed)
Pt called and lvm to notify Dr.Gudena that she was unable to tolerate her anastrozole medication. Called pt and lvm with call back number to let her know to stop it for 2 weeks and see if she has any improvement. She can call us back and determine if she would like something for nausea or if she has any other symptoms with anastrozole. Per Dr.Gudena's last note, pt okay to discontinue if unable to tolerate anti estrogen at this time.

## 2017-04-26 ENCOUNTER — Ambulatory Visit: Payer: Medicare Other | Admitting: Neurology

## 2017-05-07 ENCOUNTER — Other Ambulatory Visit: Payer: Self-pay | Admitting: Hematology and Oncology

## 2017-05-10 ENCOUNTER — Ambulatory Visit (INDEPENDENT_AMBULATORY_CARE_PROVIDER_SITE_OTHER): Payer: Medicare Other

## 2017-05-10 DIAGNOSIS — Z23 Encounter for immunization: Secondary | ICD-10-CM

## 2017-05-14 NOTE — Progress Notes (Signed)
CARDIOLOGY OFFICE NOTE  Date:  05/15/2017    [name] Baney Date of Birth: 10/13/1945 Medical Record #[id number]  PCP:  Mackie Pai, PA-C  Cardiologist:  Atilano Median    Chief Complaint  Patient presents with  . Irregular Heart Beat    Follow up visit - seen for Dr. Marlou Porch    History of Present Illness: GENTRY SEEBER is a 71 y.o. female who presents today for a follow up visit. She is a former patient of Dr. Susa Simmonds. Seen for Dr. Aundra Dubin. She now follows with me and Dr. Lovena Le.   She has a history of CAD s/p CABG, bioprosthetic AVR, CKD, paroxysmal atrial fibrillation presents for cardiology followup. She had her initial CABG in 1998, then had redo CABG in 2009 with bioprosthetic AVR. She does not remember when she last had documented atrial fibrillation, but she thinks it was a number of years ago, sounds peri-operative around the time of her redo CABG. She was never put on anticoagulation but had been on dronedarone.   In 9/14, she developed tachypalpitations and later chest pain. In the ER, SBP was in the 200s with elevated HR. Troponin was mildly elevated. Therefore, she had a cardiac cath showing patent grafts. She was thought to have had demand ischemia in the setting of hypertensive emergency + tachycardia. After discharge, she continued to have runs of tachypalpitations. A 3 week event monitor was put on her. This showed runs, sometimes long, of atrial tachycardia. She was started on Toprol XL when these runs were noted. No further palpitations (complete resolution of symptoms).   In 1/15, she was admitted to [facility] with atypical chest pain. She was ruled out for MI and Lexiscan Cardiolite showed no ischemia. She was started on Imdur and sent home. After discharge, she developed a daily headache and nausea. She did not eat or drink much due to the nausea. Shortly after this, she was standing in her grandson's pediatrician's office and became  severely lightheaded. She had to sit down. She came to the ER and was found to be orthostatic. She was given IV fluid and got better quickly. The Imdur was stopped and symptoms resolved.   Event monitor in 8/16 showed atrial tachycardia versus atypical atrial flutter. Toprol XL was increased to 100 mg bid. She saw Dr Lovena Le who thought that she had short runs of atrial tachycardia and not atrial flutter. Therefore, she has not been anticoagulated. She havingoccasional brief palpitations but overall was felt to bedoing better.   She saw Dr. Aundra Dubin back in April of 2017 - she was doing well.   Was diagnosed with breast cancer in 2017/2018. Has had surgery. Got chemo/radiation - no Adriamycin due to her history of CHF.  I have seen her back several times since has had issues with volume overload - unable to get her 4th dose of chemo due to severe fatigue,, neuropathy and cellulitis. Anemic. Referred back here from pulmonary for volume overload. I diuresed her and she slowly improved.   She was hospitalized in January of 2018 after standing up and passing out with some seizure like activity. She was evaluated extensively and thought to have orthostasis. She was placed on midodrine and her lasix was changed to as needed only. ACE and beta blocker stopped.   I last saw her in April of 2018. Last seen by Dr. Lovena Le back in October. She has been placed on amiodarone for palpitations/atrial arrhythmias. Has had her dose increased to  200 mg for the past month.   Comes back today. Here alone today. She says she feels fine. No chest pain. Not really short of breath. She notes that she will get an occasional "choking sensation" - she has this during this visit. There is no pattern to this - she had it last night and other than that, does not remember the spell prior. She is not dizzy anymore. She has not used her prn Lasix in some time. She likes being on amiodarone - this has really decreased the  sensation of palpitations for her. She feels like she is doing well overall.  She has had issues with nausea - she feels is related to her Arimidex - this happened with prior aromatase inhibitor therapy as well. She is on 200 mg of amiodarone for the past month.   PMH: 1. Diabetes mellitus 2. HTN 3. Obesity 4. Atrial arrhythmias: Paroxysmal atrial fibrillation seems to have been post-operative after CAD-AVR. She has a history of amiodarone toxicity. She has not been on coumadin. 3-week event monitor (9/13) with no atrial fibrillation. 3-week event monitor (10/14) with runs of probable atrial tachycardia. 30 day monitor in 8/16 with atrial tachycardia (multiple episodes).  5. CAD: CABG 1998 with LIMA-LAD/diagonal, sequential SVG-OM1/OM2, SVG-RCA. Redo CABG 2009 with SVG-OM3, SVG-PDA. Lexiscan myoview (9/13): EF 61%, no ischemia or infarction. LHC (10/14) with all grafts patent except the old SVG-RCA. There was 90% stenosis in the AV LCx before ungrafted MOM, but this was unchanged from prior study. Lexiscan Cardiolite (1/15) with no ischemia. She does not tolerate Imdur.  6. CKD 7. Bioprosthetic aortic valve: Echo (2011) with EF 50-55%, bioprosthetic aortic valve well-seated. Echo (2/13) with EF 60-65%, bioprosthetic aortic valve looked ok. Echo (9/14): EF 50-55%, mild LVH, bioprosthetic AVR with mean gradient 10, mild MR, moderate TR.  8. Asthma: since her teens. 9. Low back pain.  10. 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 Per neurologist, did not appear cardioembolic.  11. Carotid stenosis: 40-59% bilateral ICA stenosis on 10/14 carotid dopplers. Carotid doppler (11/15) with 40-59% bilateral ICA stenosis. Carotid dopplers (11/16) with 40-59% BICA stenosis.  12. Sleep study 12/14 without significant OSA.  13. Chronic diastolic CHF  Past Medical History:  Diagnosis Date  . Abnormally small mouth   . Anemia   . Bilateral carotid artery stenosis    Bilateral ICA 40-59%/   >50% LECA  . Breast cancer ([place]) 01/18/2016   right breast  . Breast cancer of upper-inner quadrant of right female breast ([place]) 12/16/2015  . CAD (coronary artery disease) cardiologist-  dr Aundra Dubin   Remote CABG in 1998 with redo in 2009  . CHF (congestive heart failure) (HCC)    EF is low normal at 50 to 55% per echo in Jan. 2011  . GERD (gastroesophageal reflux disease)   . Hearing loss of right ear   . History of non-ST elevation myocardial infarction (NSTEMI)    Sept 2014--  thought to be type II HTN w/ LHC without infarct related artery and patent grafts  . History of radiation therapy 05/24/16-07/26/16   right breast 50.4 Gy in 28 fractions, right breast boost 10 Gy in 5 fractions  . Hyperlipidemia   . Hypertension    Resolved.  . Iron deficiency anemia   . Moderate persistent asthma    pulmologist-  Dr. Malvin Johns  . Nonischemic cardiomyopathy ([place])   . PAF (paroxysmal atrial fibrillation) ([place])   . Personal history of chemotherapy 2017  . Personal  history of radiation therapy 2017  . Psoriasis    right leg  . Renal calculus, right   . Renal insufficiency, mild   . S/P AVR    prosthesis valve placement 2009 at same time re-do CABG  . Stroke [facility])    residual rt hearing loss  . Type 2 diabetes mellitus ([place])    monitored by dr Dwyane Dee    Past Surgical History:  Procedure Laterality Date  . AORTIC VALVE REPLACEMENT  2009   [facility] (done same time is CABG)  . BREAST LUMPECTOMY Right 01/18/2016  . BREAST LUMPECTOMY WITH RADIOACTIVE SEED AND SENTINEL LYMPH NODE BIOPSY Right 01/18/2016   Procedure: RIGHT BREAST LUMPECTOMY WITH RADIOACTIVE SEED AND SENTINEL LYMPH NODE BIOPSY;  Surgeon: DAlphonsa Overall MD;  [place]  Service: General;  Laterality: Right;  . CARDIAC CATHETERIZATION  03/23/2008   Pre-redo CABG: L main OK, LAD (T), CFX (T), OM1 99%, RCA (T), LIMA-LAD OK, SVG-OM(?3) OK w/ little florw to OM2, SVG-RCA OK. EF NL  . CARPAL TUNNEL  RELEASE    . COLONOSCOPY    . CORONARY ARTERY BYPASS GRAFT  1998 &  re-do 2009   Had LIMA to DX/LAD, SVG to 2 marginal branches and [facility]; SVG to 3rd OM and PD at time of redo  . CYSTOSCOPY W/ URETERAL STENT PLACEMENT Right 12/20/2014   Procedure: CYSTOSCOPY WITH RETROGRADE PYELOGRAM/URETERAL STENT PLACEMENT;  Surgeon: PCleon Gustin MD;  [facility]  Service: Urology;  Laterality: Right;  . EYE SURGERY Bilateral    cataracts  . HOLMIUM LASER APPLICATION Right 74/06/270  Procedure:  HOLMIUM LASER LITHOTRIPSY;  Surgeon: PCleon Gustin MD;  [facility]  Service: Urology;  Laterality: Right;  . LEFT HEART CATHETERIZATION WITH CORONARY/GRAFT ANGIOGRAM N/A 02/23/2013   Procedure: LEFT HEART CATHETERIZATION WITH CBeatrix Fetters  Surgeon: MBlane Ohara MD;  [facility];  Service: Cardiovascular;  Laterality: N/A;  . PORTACATH PLACEMENT Left 01/18/2016   Procedure: INSERTION PORT-A-CATH;  Surgeon: DAlphonsa Overall MD;  [place]  Service: General;  Laterality: Left;  . TONSILLECTOMY    . TRANSTHORACIC ECHOCARDIOGRAM  02-24-2013      mild LVH,  ef 50-55%/  AV bioprosthesis was present with very mild stenosis and no regurg., mean grandient 175mg, peak grandient 2067m /  mild MR/  mild LAE and RAE/  moderate TR  . TUBAL LIGATION       Medications: Current Meds  Medication Sig  . albuterol (PROVENTIL HFA;VENTOLIN HFA) 108 (90 Base) MCG/ACT inhaler Inhale 2 puffs into the lungs every 6 (six) hours as needed for wheezing or shortness of breath.  . aMarland Kitchenendronate (FOSAMAX) 70 MG tablet Take 70 mg by mouth every Wednesday. Take with a full glass of water on an empty stomach.   . aMarland Kitcheniodarone (PACERONE) 200 MG tablet Take 1 tablet (200 mg total) daily by mouth.  . aMarland Kitchenpirin EC 81 MG EC tablet Take 1 tablet (81 mg total) by mouth daily.  . clopidogrel (PLAVIX) 75 MG tablet Take 1 tablet (75 mg total) by mouth  daily.  . eMarland Kitchenetimibe (ZETIA) 10 MG tablet TAKE 1 TABLET BY MOUTH  DAILY  . furosemide (LASIX) 20 MG tablet Take 20 mg by mouth as needed.  . gabapentin (NEURONTIN) 300 MG capsule Take 1 capsule (300 mg total) by mouth 3 (three) times daily.  . gMarland Kitchenucose blood (ACCU-CHEK GUIDE) test strip Use to test blood  sugar 3 times daily  . Insulin Disposable Pump (V-GO 30) KIT Use 1 per day  . insulin lispro (HUMALOG KWIKPEN) 100 UNIT/ML KiwkPen INJECT 10 UNITS INTO THE SKIN WITH MEALS PLUS SLIDING SCALE  . insulin lispro (HUMALOG) 100 UNIT/ML injection Inject 76 units daily with  V-GO pump  . loratadine (CLARITIN) 10 MG tablet Take 10 mg by mouth daily.  . midodrine (PROAMATINE) 5 MG tablet ONE TABLET BY MOUTH THREE TIMES DAILY WITH MEALS  . prochlorperazine (COMPAZINE) 10 MG tablet TAKE 1 TABLET BY MOUTH  EVERY 6 HOURS AS NEEDED FOR NAUSEA OR VOMITING  . rosuvastatin (CRESTOR) 40 MG tablet TAKE ONE TABLET BY MOUTH DAILY  . SYMBICORT 160-4.5 MCG/ACT inhaler INHALE TWO PUFFS BY MOUTH TWICE DAILY  . traMADol (ULTRAM-ER) 200 MG 24 hr tablet   . TRULICITY 2.26 JF/3.5KT SOPN Inject 0.75 mg into the skin every Wednesday.     Allergies: Allergies  Allergen Reactions  . Amoxicillin Rash    Has patient had a PCN reaction causing immediate rash, facial/tongue/throat swelling, SOB or lightheadedness with hypotension: No Has patient had a PCN reaction causing severe rash involving mucus membranes or skin necrosis: Yes Has patient had a PCN reaction that required hospitalization No Has patient had a PCN reaction occurring within the last 10 years: No If all of the above answers are "NO", then may proceed with Cephalosporin use.   . Imdur [Isosorbide Dinitrate] Other (See Comments)    Headache/severe hypotension/Syncope  . Arimidex [Anastrozole] Nausea Only  . Latex Itching  . Tetracycline Rash    Social History: The patient  reports that  has never smoked. she has never used smokeless tobacco. She reports  that she does not drink alcohol or use drugs.   Family History: The patient's family history includes Diabetes in her maternal grandmother and son; Healthy in her brother; Heart attack in her brother; Heart disease in her brother, father, and maternal grandmother; Heart failure in her father.   Review of Systems: Please see the history of present illness.   Otherwise, the review of systems is positive for none.   All other systems are reviewed and negative.   Physical Exam: VS:  BP (!) 86/46   Pulse (!) 126   Ht 5' 5"  (1.651 m)   Wt 195 lb (88.5 kg)   BMI 32.45 kg/m  .  BMI Body mass index is 32.45 kg/m.  Wt Readings from Last 3 Encounters:  05/15/17 195 lb (88.5 kg)  04/04/17 195 lb (88.5 kg)  03/13/17 194 lb 9.6 oz (88.3 kg)    General: Pleasant. She looks good. She is alert and in no acute distress.   HEENT: Normal.  Neck: Supple, no JVD, carotid bruits, or masses noted.  Cardiac: Regular rate and rhythm but fast. No edema.  Respiratory:  Lungs are clear to auscultation bilaterally with normal work of breathing.  GI: Soft and nontender.  MS: No deformity or atrophy. Gait and ROM intact.  Skin: Warm and dry. Color is normal.  Neuro:  Strength and sensation are intact and no gross focal deficits noted.  Psych: Alert, appropriate and with normal affect.   LABORATORY DATA:  EKG:  EKG is ordered today. This demonstrates probable atrial tach versus 2:1 atrial flutter - reviewed with Dr. Lovena Le. HR is 126.   Lab Results  Component Value Date   WBC 6.4 01/29/2017   HGB 10.3 (L) 01/29/2017   HCT 31.2 (L) 01/29/2017   PLT 144.0 (L) 01/29/2017  GLUCOSE 91 02/14/2017   CHOL 137 07/10/2016   TRIG 107.0 07/10/2016   HDL 33.70 (L) 07/10/2016   LDLDIRECT 53.0 10/08/2016   LDLCALC 82 07/10/2016   ALT 13 02/14/2017   AST 23 02/14/2017   NA 141 02/14/2017   K 4.6 02/14/2017   CL 103 02/14/2017   CREATININE 1.44 (H) 02/14/2017   BUN 22 02/14/2017   CO2 34 (H) 02/14/2017    TSH 1.431 07/02/2016   INR 1.06 06/15/2013   HGBA1C 7.0 (H) 02/14/2017   MICROALBUR 13.3 (H) 02/14/2017     BNP (last 3 results) Recent Labs    05/23/16 1143 06/22/16 2109  BNP 243.3* 257.0*    ProBNP (last 3 results) No results for input(s): PROBNP in the last 8760 hours.   Other Studies Reviewed Today:  Echo Study Conclusions from 03/2016  - Left ventricle: The cavity size was normal. There was moderate focal basal and mild concentric hypertrophy. Systolic function was normal. The estimated ejection fraction was in the range of 60% to 65%. Images were inadequate for LV wall motion assessment. Features are consistent with a pseudonormal left ventricular filling pattern, with concomitant abnormal relaxation and increased filling pressure (grade 2 diastolic dysfunction). Doppler parameters are consistent with high ventricular filling pressure. - Aortic valve: A bioprosthesis was present and functioning normally. Mean gradient (S): 17 mm Hg. - Mitral valve: Moderately calcified annulus. - Pulmonary arteries: PA peak pressure: 38 mm Hg (S).  Impressions:  - The right ventricular systolic pressure was increased consistent with mild pulmonary hypertension.   MYOVIEW FINDINGS FROM 06/2013: Normal resting EKG. Slight ST depressions noted in aVL after administration of lexiscan. Mild shortness of breath and nausea resolved after the test. EKG is nondiagnostic for ischemia. TID ratio 1.05. Lung-heart ratio 0.43. Normal ventricular chamber size.  IMPRESSION: No evidence for ischemia. Normal wall motion. LVEF 72%.   Electronically Signed By: Guy Sandifer On: 06/15/2013 14:27    Assessment/Plan:  1. Prior syncope- orthostasis - she is on Midodrine - BP is low but she is really not symptomatic. Discussed with Dr. Lovena Le.   2. Atrial arrhythmias - she is now on amiodarone. PMH noted by Dr. Aundra Dubin in the past for amiodarone toxicity  - no specifics noted - would follow - she feels good on this regimen. Lab today. Looks to be in possible 2:1 atrial flutter today with elevated HR of 126. Really not symptomatic. Discussed with Dr. Lovena Le. He would like to get labs, echo updated. He will see back in 2 weeks - plan then based on repeat EKG will be 3 days Eliquis with TEE/cardioversion versus ablation.   3. CAD - she is doing well clinically - last LHC showed stable anatomy with patent grafts except for SVG-RCA from older CABG.   4. Chronic diastolic HF - looks very compensated. Not using prn Lasix.   5. Anemia - lab today  6. Bioprosthetic aortic valve -  Updating echo.   7. CKD  8. Breast cancer- she is on Arimidex. Followed by oncology.  Current medicines are reviewed with the patient today.  The patient does not have concerns regarding medicines other than what has been noted above.  The following changes have been made:  See above.  Labs/ tests ordered today include:    Orders Placed This Encounter  Procedures  . Basic metabolic panel  . CBC  . Hepatic function panel  . TSH  . EKG 12-Lead  . ECHOCARDIOGRAM COMPLETE     Disposition:  FU with Dr. Lovena Le in 2 weeks. I will see her back in about 4 months. Further disposition to follow in 2 weeks - see above.    Patient is agreeable to this plan and will call if any problems develop in the interim.   SignedTruitt Merle, NP  05/15/2017 12:17 PM  [place] [address] [place], [place]  [address] Phone: [phone] Fax: [phone]

## 2017-05-15 ENCOUNTER — Encounter: Payer: Self-pay | Admitting: Nurse Practitioner

## 2017-05-15 ENCOUNTER — Ambulatory Visit (INDEPENDENT_AMBULATORY_CARE_PROVIDER_SITE_OTHER): Payer: Medicare Other | Admitting: Nurse Practitioner

## 2017-05-15 VITALS — BP 86/46 | HR 126 | Ht 65.0 in | Wt 195.0 lb

## 2017-05-15 DIAGNOSIS — I5032 Chronic diastolic (congestive) heart failure: Secondary | ICD-10-CM

## 2017-05-15 DIAGNOSIS — I951 Orthostatic hypotension: Secondary | ICD-10-CM | POA: Diagnosis not present

## 2017-05-15 DIAGNOSIS — I2510 Atherosclerotic heart disease of native coronary artery without angina pectoris: Secondary | ICD-10-CM | POA: Diagnosis not present

## 2017-05-15 DIAGNOSIS — Z952 Presence of prosthetic heart valve: Secondary | ICD-10-CM

## 2017-05-15 NOTE — Patient Instructions (Addendum)
We will be checking the following labs today - BMET, CBC, HPF and TSH   Medication Instructions:    Continue with your current medicines for now.     Testing/Procedures To Be Arranged:  Echocardiogram 05/20/17 @ 10:30   Follow-Up:   See Dr. Lovena Le in 2 weeks to discuss further treatment plan for your heart rhythm. I WILL HAVE MELISSA TATUM SCHEDULER FOR EP CALL YOU WITH AN APPT TO SEE DR. Lovena Le IN 2 MONTHS   I will see you in 4 months 10/01/17 @ 10:30 WILL Truitt Merle, NP    Other Special Instructions:   N/A    If you need a refill on your cardiac medications before your next appointment, please call your pharmacy.   Call the [place] office at [phone] if you have any questions, problems or concerns.

## 2017-05-16 ENCOUNTER — Other Ambulatory Visit (INDEPENDENT_AMBULATORY_CARE_PROVIDER_SITE_OTHER): Payer: Medicare Other

## 2017-05-16 DIAGNOSIS — Z794 Long term (current) use of insulin: Secondary | ICD-10-CM | POA: Diagnosis not present

## 2017-05-16 DIAGNOSIS — E1165 Type 2 diabetes mellitus with hyperglycemia: Secondary | ICD-10-CM | POA: Diagnosis not present

## 2017-05-16 LAB — CBC
Hematocrit: 31.7 % — ABNORMAL LOW (ref 34.0–46.6)
Hemoglobin: 10.2 g/dL — ABNORMAL LOW (ref 11.1–15.9)
MCH: 30.4 pg (ref 26.6–33.0)
MCHC: 32.2 g/dL (ref 31.5–35.7)
MCV: 94 fL (ref 79–97)
Platelets: 161 10*3/uL (ref 150–379)
RBC: 3.36 x10E6/uL — ABNORMAL LOW (ref 3.77–5.28)
RDW: 13.1 % (ref 12.3–15.4)
WBC: 6.2 10*3/uL (ref 3.4–10.8)

## 2017-05-16 LAB — BASIC METABOLIC PANEL
BUN/Creatinine Ratio: 13 (ref 12–28)
BUN: 21 mg/dL (ref 8–27)
CO2: 26 mmol/L (ref 20–29)
Calcium: 9.3 mg/dL (ref 8.7–10.3)
Chloride: 101 mmol/L (ref 96–106)
Creatinine, Ser: 1.66 mg/dL — ABNORMAL HIGH (ref 0.57–1.00)
GFR calc Af Amer: 35 mL/min/{1.73_m2} — ABNORMAL LOW (ref >59)
GFR calc non Af Amer: 31 mL/min/{1.73_m2} — ABNORMAL LOW (ref >59)
Glucose: 206 mg/dL — ABNORMAL HIGH (ref 65–99)
Potassium: 4.5 mmol/L (ref 3.5–5.2)
Sodium: 141 mmol/L (ref 134–144)

## 2017-05-16 LAB — HEPATIC FUNCTION PANEL
ALT: 13 IU/L (ref 0–32)
AST: 23 IU/L (ref 0–40)
Albumin: 3.5 g/dL (ref 3.5–4.8)
Alkaline Phosphatase: 70 IU/L (ref 39–117)
Bilirubin Total: 0.5 mg/dL (ref 0.0–1.2)
Bilirubin, Direct: 0.21 mg/dL (ref 0.00–0.40)
Total Protein: 6 g/dL (ref 6.0–8.5)

## 2017-05-16 LAB — HEMOGLOBIN A1C: Hgb A1c MFr Bld: 7.7 % — ABNORMAL HIGH (ref 4.6–6.5)

## 2017-05-16 LAB — TSH: TSH: 2.19 u[IU]/mL (ref 0.450–4.500)

## 2017-05-20 ENCOUNTER — Other Ambulatory Visit (HOSPITAL_COMMUNITY): Payer: Medicare Other

## 2017-05-22 ENCOUNTER — Ambulatory Visit (INDEPENDENT_AMBULATORY_CARE_PROVIDER_SITE_OTHER): Payer: Medicare Other | Admitting: Endocrinology

## 2017-05-22 ENCOUNTER — Ambulatory Visit (HOSPITAL_BASED_OUTPATIENT_CLINIC_OR_DEPARTMENT_OTHER): Payer: Medicare Other | Admitting: Hematology and Oncology

## 2017-05-22 ENCOUNTER — Encounter: Payer: Self-pay | Admitting: Endocrinology

## 2017-05-22 ENCOUNTER — Telehealth: Payer: Self-pay | Admitting: Hematology and Oncology

## 2017-05-22 VITALS — BP 132/72 | HR 83 | Ht 65.0 in | Wt 195.4 lb

## 2017-05-22 DIAGNOSIS — G620 Drug-induced polyneuropathy: Secondary | ICD-10-CM | POA: Diagnosis not present

## 2017-05-22 DIAGNOSIS — E1165 Type 2 diabetes mellitus with hyperglycemia: Secondary | ICD-10-CM | POA: Diagnosis not present

## 2017-05-22 DIAGNOSIS — Z170 Estrogen receptor positive status [ER+]: Secondary | ICD-10-CM

## 2017-05-22 DIAGNOSIS — Z794 Long term (current) use of insulin: Secondary | ICD-10-CM

## 2017-05-22 DIAGNOSIS — C50211 Malignant neoplasm of upper-inner quadrant of right female breast: Secondary | ICD-10-CM

## 2017-05-22 DIAGNOSIS — R110 Nausea: Secondary | ICD-10-CM

## 2017-05-22 MED ORDER — EXEMESTANE 25 MG PO TABS: 25.0000 mg | ORAL_TABLET | Freq: Every day | ORAL | 0 refills | Status: DC

## 2017-05-22 NOTE — Progress Notes (Signed)
Patient ID: Hannah [name], female   DOB: Jan 05, 1946, 71 y.o.   MRN: [id number]           Reason for Appointment: Follow-up for Type 2 Diabetes   History of Present Illness:          Date of diagnosis of type 2 diabetes mellitus: Late 1980s        Background history:  She was previously treated with metformin prior to starting insulin She thinks she has been on insulin for at least 10 years and mostly taking Lantus and other types of insulin with it Her metformin was stopped when she started insulin and also has had renal dysfunction Previously her A1c had been consistently over 8% and occasionally higher.  Recent history:   INSULIN regimen is described as:  V-go 40 basal, mealtime coverage 8 u acB and 8-10 acS  She has been on the V-go pump since 03/2015  Most recent A1c 7.7 %, previously has been as low as 6.4  Current blood sugar patterns and problems identified:   Although she has been advised to check her sugars recurrently after meals she is mostly checking them in the morning fasting now, she misunderstood her instructions on the last visit  Difficult to know why her A1c is higher since she has only occasional high blood sugars documented  She does have sporadic high readings even in the morning and this is because of her forgetting to change her pump at night after her shower.  She does think that she can also do better with taking boluses for her meals before eating which she may forget especially when eating out  Although she has not lost any rate think that she is trying to reduce her carbohydrate intake significantly especially at suppertime  However is generally eating unbalanced meals in the mornings when she does eat breakfast, sometimes will have a fruit bar and other times oatmeal only  Hypoglycemia has occurred only twice waking up last month and no nocturnal hypoglycemia  She is still be benefiting from Trulicity 8.33 mg weekly as weight is stable  Oral  hypoglycemic drugs the patient is taking are: Trulicity 8.25 mg weekly  Side effects from medications have been: None  Compliance with the medical regimen: Fair  Glucose monitoring:  done about 1 times a day         Glucometer:  Accu-Chek [employer] Blood Glucose readings by download for the last 4 weeks:  Mean values apply above for all meters except median for One Touch  PRE-MEAL Fasting Lunch Dinner Bedtime Overall  Glucose range: 51-219  73-1 84  137  70-348    Mean/median:     122+/-62      Self-care: The diet that the patient has been following is: tries to limit fats and carbs.  Eating out periodically She uses diet green tea or diet drinks and no sugar in drinks    Lunch 1-2 pm Dinner 6-7 PM; Hs snack   Typical meal intake: Breakfast is oatrmeal usually              Dietician visit, most recent: 5/16 And in 7/16 with oncology dietitian                Exercise: less walking  Weight history:  Wt Readings from Last 3 Encounters:  05/22/17 195 lb 6.4 oz (88.6 kg)  05/22/17 195 lb 9.6 oz (88.7 kg)  05/15/17 195 lb (88.5 kg)    Glycemic control:  Lab Results  Component Value Date   HGBA1C 7.7 (H) 05/16/2017   HGBA1C 7.0 (H) 02/14/2017   HGBA1C 6.9 (H) 10/09/2016   Lab Results  Component Value Date   MICROALBUR 13.3 (H) 02/14/2017   LDLCALC 82 07/10/2016   CREATININE 1.66 (H) 05/15/2017       Allergies as of 05/22/2017      Reactions   Amoxicillin Rash   Has patient had a PCN reaction causing immediate rash, facial/tongue/throat swelling, SOB or lightheadedness with hypotension: No Has patient had a PCN reaction causing severe rash involving mucus membranes or skin necrosis: Yes Has patient had a PCN reaction that required hospitalization No Has patient had a PCN reaction occurring within the last 10 years: No If all of the above answers are "NO", then may proceed with Cephalosporin use.   Imdur [isosorbide Dinitrate] Other (See Comments)    Headache/severe hypotension/Syncope   Arimidex [anastrozole] Nausea Only   Latex Itching   Tetracycline Rash      Medication List        Accurate as of 05/22/17  1:25 PM. Always use your most recent med list.          albuterol 108 (90 Base) MCG/ACT inhaler Commonly known as:  PROVENTIL HFA;VENTOLIN HFA Inhale 2 puffs into the lungs every 6 (six) hours as needed for wheezing or shortness of breath.   alendronate 70 MG tablet Commonly known as:  FOSAMAX Take 70 mg by mouth every Wednesday. Take with a full glass of water on an empty stomach.   amiodarone 200 MG tablet Commonly known as:  PACERONE Take 1 tablet (200 mg total) daily by mouth.   aspirin 81 MG EC tablet Take 1 tablet (81 mg total) by mouth daily.   clopidogrel 75 MG tablet Commonly known as:  PLAVIX Take 1 tablet (75 mg total) by mouth daily.   exemestane 25 MG tablet Commonly known as:  AROMASIN Take 1 tablet (25 mg total) by mouth daily after breakfast.   ezetimibe 10 MG tablet Commonly known as:  ZETIA TAKE 1 TABLET BY MOUTH  DAILY   furosemide 20 MG tablet Commonly known as:  LASIX Take 20 mg by mouth as needed.   gabapentin 300 MG capsule Commonly known as:  NEURONTIN Take 1 capsule (300 mg total) by mouth 3 (three) times daily.   glucose blood test strip Commonly known as:  ACCU-CHEK GUIDE Use to test blood sugar 3 times daily   insulin lispro 100 UNIT/ML KiwkPen Commonly known as:  HUMALOG KWIKPEN INJECT 10 UNITS INTO THE SKIN WITH MEALS PLUS SLIDING SCALE   insulin lispro 100 UNIT/ML injection Commonly known as:  HUMALOG Inject 76 units daily with  V-GO pump   loratadine 10 MG tablet Commonly known as:  CLARITIN Take 10 mg by mouth daily.   midodrine 5 MG tablet Commonly known as:  PROAMATINE ONE TABLET BY MOUTH THREE TIMES DAILY WITH MEALS   prochlorperazine 10 MG tablet Commonly known as:  COMPAZINE TAKE 1 TABLET BY MOUTH  EVERY 6 HOURS AS NEEDED FOR NAUSEA OR VOMITING     rosuvastatin 40 MG tablet Commonly known as:  CRESTOR TAKE ONE TABLET BY MOUTH DAILY   SYMBICORT 160-4.5 MCG/ACT inhaler Generic drug:  budesonide-formoterol INHALE TWO PUFFS BY MOUTH TWICE DAILY   traMADol 200 MG 24 hr tablet Commonly known as:  ULTRAM-ER   TRULICITY 3.82 NK/5.3ZJ Sopn Generic drug:  Dulaglutide Inject 0.75 mg into the skin every Wednesday.   V-GO 30  Kit Use 1 per day       Allergies:  Allergies  Allergen Reactions  . Amoxicillin Rash    Has patient had a PCN reaction causing immediate rash, facial/tongue/throat swelling, SOB or lightheadedness with hypotension: No Has patient had a PCN reaction causing severe rash involving mucus membranes or skin necrosis: Yes Has patient had a PCN reaction that required hospitalization No Has patient had a PCN reaction occurring within the last 10 years: No If all of the above answers are "NO", then may proceed with Cephalosporin use.   . Imdur [Isosorbide Dinitrate] Other (See Comments)    Headache/severe hypotension/Syncope  . Arimidex [Anastrozole] Nausea Only  . Latex Itching  . Tetracycline Rash    Past Medical History:  Diagnosis Date  . Abnormally small mouth   . Anemia   . Bilateral carotid artery stenosis    Bilateral ICA 40-59%/  >50% LECA  . Breast cancer ([place]) 01/18/2016   right breast  . Breast cancer of upper-inner quadrant of right female breast ([place]) 12/16/2015  . CAD (coronary artery disease) cardiologist-  dr Aundra Dubin   Remote CABG in 1998 with redo in 2009  . CHF (congestive heart failure) ([facility])    EF is low normal at 50 to 55% per echo in Jan. 2011  . GERD (gastroesophageal reflux disease)   . Hearing loss of right ear   . History of non-ST elevation myocardial infarction (NSTEMI)    Sept 2014--  thought to be type II HTN w/ LHC without infarct related artery and patent grafts  . History of radiation therapy 05/24/16-07/26/16   right breast 50.4 Gy in 28 fractions, right breast boost 10 Gy  in 5 fractions  . Hyperlipidemia   . Hypertension    Resolved.  . Iron deficiency anemia   . Moderate persistent asthma    pulmologist-  Dr. Malvin Johns  . Nonischemic cardiomyopathy ([place])   . PAF (paroxysmal atrial fibrillation) ([place])   . Personal history of chemotherapy 2017  . Personal history of radiation therapy 2017  . Psoriasis    right leg  . Renal calculus, right   . Renal insufficiency, mild   . S/P AVR    prosthesis valve placement 2009 at same time re-do CABG  . Stroke [facility])    residual rt hearing loss  . Type 2 diabetes mellitus ([place])    monitored by dr Dwyane Dee    Past Surgical History:  Procedure Laterality Date  . AORTIC VALVE REPLACEMENT  [facility]   [facility]done same time is CABG)  . BREAST LUMPECTOMY Right 01/18/2016  . BREAST LUMPECTOMY WITH RADIOACTIVE SEED AND SENTINEL LYMPH NODE BIOPSY Right 01/18/2016   Procedure: RIGHT BREAST LUMPECTOMY WITH RADIOACTIVE SEED AND SENTINEL LYMPH NODE BIOPSY;  Surgeon: DAlphonsa Overall MD;  [place]  Service: General;  Laterality: Right;  . CARDIAC CATHETERIZATION  03/23/2008   Pre-redo CABG: L main OK, LAD (T), CFX (T), OM1 99%, RCA (T), LIMA-LAD OK, SVG-OM(?3) OK w/ little florw to OM2, SVG-RCA OK. EF NL  . CARPAL TUNNEL RELEASE    . COLONOSCOPY    . CORONARY ARTERY BYPASS GRAFT  1998 &  re-do 2009   Had LIMA to DX/LAD, [facility] 2 marginal [facility] SVG to 3rd OM and PD at time of redo  . CYSTOSCOPY W/ URETERAL STENT PLACEMENT Right 12/20/2014   Procedure: CYSTOSCOPY WITH RETROGRADE PYELOGRAM/URETERAL STENT PLACEMENT;  Surgeon: PCleon Gustin MD;  Location:  [place];  Service: Urology;  Laterality: Right;  . EYE SURGERY Bilateral    cataracts  . HOLMIUM LASER APPLICATION Right 8/91/6945   Procedure:  HOLMIUM LASER LITHOTRIPSY;  Surgeon: Cleon Gustin, MD;  Location: [facility];  Service: Urology;  Laterality: Right;  . LEFT  HEART CATHETERIZATION WITH CORONARY/GRAFT ANGIOGRAM N/A 02/23/2013   Procedure: LEFT HEART CATHETERIZATION WITH Beatrix Fetters;  Surgeon: Blane Ohara, MD;  Location: [facility] CATH LAB;  Service: Cardiovascular;  Laterality: N/A;  . PORTACATH PLACEMENT Left 01/18/2016   Procedure: INSERTION PORT-A-CATH;  Surgeon: Alphonsa Overall, MD;  Location: [place];  Service: General;  Laterality: Left;  . TONSILLECTOMY    . TRANSTHORACIC ECHOCARDIOGRAM  02-24-2013      mild LVH,  ef 50-55%/  AV bioprosthesis was present with very mild stenosis and no regurg., mean grandient 61mHg, peak grandient 262mg /  mild MR/  mild LAE and RAE/  moderate TR  . TUBAL LIGATION      Family History  Problem Relation Age of Onset  . Heart disease Father   . Heart failure Father   . Diabetes Maternal Grandmother   . Heart disease Maternal Grandmother   . Diabetes Son   . Healthy Brother        #1  . Heart attack Brother        #2  . Heart disease Brother        #2    Social History:  reports that  has never smoked. she has never used smokeless tobacco. She reports that she does not drink alcohol or use drugs.    Review of Systems    Lipid history: On 40 mg Crestor along with Zetia      Lab Results  Component Value Date   CHOL 137 07/10/2016   HDL 33.70 (L) 07/10/2016   LDLCALC 82 07/10/2016   LDLDIRECT 53.0 10/08/2016   TRIG 107.0 07/10/2016   CHOLHDL 4 07/10/2016           Eyes:  No history of retinopathy   She is followed by nephrologist for renal dysfunction, etiology unclear  Diabetic foot exam in 7/18: She has  loss of monofilament sensation in her distal feet   Physical Examination:  BP 132/72   Pulse 83   Ht 5' 5"  (1.651 m)   Wt 195 lb 6.4 oz (88.6 kg)   SpO2 96%   BMI 32.52 kg/m    ASSESSMENT:  Diabetes type 2, uncontrolled   See history of present illness for detailed discussion of  current management, blood sugar patterns and problems identified  As before her A1c is  higher than expected from her blood sugars but she is primarily checking fasting readings She has difficulties in compliance as discussed above  RENAL dysfunction: Variable and followed by cardiologist  Hyperlipidemia: Needs follow-up  PLAN:     She will try to check blood sugars less in the morning and more after meals at various times  Discussed various options for having balanced meals with protein in the morning  Discussed blood sugar targets at various times including bedtime  She will make sure she can adjust her boluses based on how much she is eating and carbohydrate intake as well as seeing how high her blood sugars go with certain types of meals  Also showed her the Omnipod pump and discussed in detail how this would be different.  She is interested in doing this since fairly improved compliance with  changing her pump and bolusing  If she is interested she will call for insurance approval and let us know when she is ready to be trained  She will have lipid panel on the next visit  No change in current basal rate  Consistent bolusing for all meals and large snacks  To increase Trulicity up to 1.5 mg on the next prescription  Increase exercise when able to  Patient Instructions  Less checking in am and much more after meals  Cheese toast in am  Check blood sugars on waking up 3/7   Also check blood sugars about 2 hours after a meal and do this after different meals by rotation  Recommended blood sugar levels on waking up is 90-130 and about 2 hours after meal is 130-160  Please bring your blood sugar monitor to each visit, thank you           Elayne Snare 05/22/2017, 1:25 PM   Note: This office note was prepared with Dragon voice recognition system technology. Any transcriptional errors that result from this process are unintentional.

## 2017-05-22 NOTE — Progress Notes (Signed)
Patient Care Team: Saguier, Iris Pert as PCP - General (Internal Medicine) Alphonsa Overall, MD as Consulting Physician (General Surgery) Nicholas Lose, MD as Consulting Physician (Hematology and Oncology) Gery Pray, MD as Consulting Physician (Radiation Oncology) Evans Lance, MD as Consulting Physician (Cardiology) Collene Gobble, MD as Consulting Physician (Pulmonary Disease) Elayne Snare, MD as Consulting Physician (Endocrinology) Delice Bison Charlestine Massed, NP as Nurse Practitioner (Hematology and Oncology)  DIAGNOSIS:  Encounter Diagnosis  Name Primary?  . 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 breast ([place])   12/14/2015 Initial Diagnosis    Right breast biopsy 12:30 position: 2 masses, 1.6 cm mass: Invasive ductal carcinoma, grade 2, ER 0%, PR 0%, HER-2 negative, Ki-67 70%; satellite mass 8 mm: IDC grade 2 ER 5%, PR 5%, HER-2 negative, Ki-67 50%; T1cN0 stage IA clinical stage      01/18/2016 Surgery    Right lumpectomy: Multifocal IDC grade 3, 1.9 cm  ER 0%, PR 0%, HER-2 negative, Ki-67 70% and 0.8 cm satellite mass ER 5%, PR 2%, HER-2 negative, Ki-67 50%, high-grade DCIS, margins negative, 0/1 lymph nodes negative T1 cN0 stage IA      02/17/2016 - 04/06/2016 Chemotherapy    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       04/08/2016 - 04/23/2016 Hospital Admission    Hosp adm for cellulitis      05/24/2016 - 07/25/2016 Radiation Therapy     Adj XRT      06/29/2016 - 07/04/2016 Hospital Admission    Seizure like activity, MRI brain and EEG unremarkable       08/16/2016 -  Anti-estrogen oral therapy    Letrozole could not tolerate it due to dizziness and lightheadedness, switched to anastrozole 04/04/2017       CHIEF COMPLIANT: Follow-up on letrozole therapy complaining  of profound nausea  INTERVAL HISTORY: CIA GARRETSON is a 71 year old with above-mentioned history of right breast cancer who underwent adjuvant chemotherapy followed by radiation and is currently on oral antiestrogen therapy.  She had previously tried anastrozole and she could not tolerated because of dizziness.  She then went on letrozole.  Letrozole is causing her profound nausea.  Because of this she is interested to find out if there are any alternatives to the treatments that she had received.  She stopped omeprazole and the nausea went away.  REVIEW OF SYSTEMS:   Constitutional: Denies fevers, chills or abnormal weight loss Eyes: Denies blurriness of vision Ears, nose, mouth, throat, and face: Denies mucositis or sore throat Respiratory: Denies cough, dyspnea or wheezes Cardiovascular: Denies palpitation, chest discomfort Gastrointestinal: Nausea due to letrozole Skin: Denies abnormal skin rashes Lymphatics: Denies new lymphadenopathy or easy bruising Neurological:Denies numbness, tingling or new weaknesses Behavioral/Psych: Mood is stable, no new changes  Extremities: No lower extremity edema  All other systems were reviewed with the patient and are negative.  I have reviewed the past medical history, past surgical history, social history and family history with the patient and they are unchanged from previous note.  ALLERGIES:  is allergic to amoxicillin; imdur [isosorbide dinitrate]; arimidex [anastrozole]; latex; and tetracycline.  MEDICATIONS:  Current Outpatient Medications  Medication Sig Dispense Refill  . albuterol (PROVENTIL HFA;VENTOLIN HFA) 108 (90 Base) MCG/ACT inhaler Inhale 2 puffs into the lungs every 6 (six) hours as needed for wheezing or  shortness of breath.    Marland Kitchen alendronate (FOSAMAX) 70 MG tablet Take 70 mg by mouth every Wednesday. Take with a full glass of water on an empty stomach.     Marland Kitchen amiodarone (PACERONE) 200 MG tablet Take 1 tablet (200 mg total) daily  by mouth. 90 tablet 3  . aspirin EC 81 MG EC tablet Take 1 tablet (81 mg total) by mouth daily.    . clopidogrel (PLAVIX) 75 MG tablet Take 1 tablet (75 mg total) by mouth daily. 90 tablet 1  . ezetimibe (ZETIA) 10 MG tablet TAKE 1 TABLET BY MOUTH  DAILY 90 tablet 2  . furosemide (LASIX) 20 MG tablet Take 20 mg by mouth as needed.    . gabapentin (NEURONTIN) 300 MG capsule Take 1 capsule (300 mg total) by mouth 3 (three) times daily. 90 capsule 0  . glucose blood (ACCU-CHEK GUIDE) test strip Use to test blood sugar 3 times daily 100 each 5  . Insulin Disposable Pump (V-GO 30) KIT Use 1 per day 90 kit 1  . insulin lispro (HUMALOG KWIKPEN) 100 UNIT/ML KiwkPen INJECT 10 UNITS INTO THE SKIN WITH MEALS PLUS SLIDING SCALE 15 mL 2  . insulin lispro (HUMALOG) 100 UNIT/ML injection Inject 76 units daily with  V-GO pump 70 mL 3  . loratadine (CLARITIN) 10 MG tablet Take 10 mg by mouth daily.    . midodrine (PROAMATINE) 5 MG tablet ONE TABLET BY MOUTH THREE TIMES DAILY WITH MEALS 90 tablet 6  . prochlorperazine (COMPAZINE) 10 MG tablet TAKE 1 TABLET BY MOUTH  EVERY 6 HOURS AS NEEDED FOR NAUSEA OR VOMITING 60 tablet 0  . rosuvastatin (CRESTOR) 40 MG tablet TAKE ONE TABLET BY MOUTH DAILY 90 tablet 3  . SYMBICORT 160-4.5 MCG/ACT inhaler INHALE TWO PUFFS BY MOUTH TWICE DAILY 10.2 g 2  . traMADol (ULTRAM-ER) 200 MG 24 hr tablet     . TRULICITY 6.30 ZS/0.1UX SOPN Inject 0.75 mg into the skin every Wednesday. 12 pen 2   No current facility-administered medications for this visit.     PHYSICAL EXAMINATION: ECOG PERFORMANCE STATUS: 1 - Symptomatic but completely ambulatory  Vitals:   05/22/17 1116  BP: (!) 119/44  Pulse: 88  Resp: 18  Temp: 98.4 F (36.9 C)  SpO2: 98%   Filed Weights   05/22/17 1116  Weight: 195 lb 9.6 oz (88.7 kg)    GENERAL:alert, no distress and comfortable SKIN: skin color, texture, turgor are normal, no rashes or significant lesions EYES: normal, Conjunctiva are pink and  non-injected, sclera clear OROPHARYNX:no exudate, no erythema and lips, buccal mucosa, and tongue normal  NECK: supple, thyroid normal size, non-tender, without nodularity LYMPH:  no palpable lymphadenopathy in the cervical, axillary or inguinal LUNGS: clear to auscultation and percussion with normal breathing effort HEART: regular rate & rhythm and no murmurs and no lower extremity edema ABDOMEN:abdomen soft, non-tender and normal bowel sounds MUSCULOSKELETAL:no cyanosis of digits and no clubbing  NEURO: alert & oriented x 3 with fluent speech, no focal motor/sensory deficits EXTREMITIES: No lower extremity edema  LABORATORY DATA:  I have reviewed the data as listed   Chemistry      Component Value Date/Time   NA 141 05/15/2017 1230   NA 143 04/27/2016 1352   K 4.5 05/15/2017 1230   K 4.1 04/27/2016 1352   CL 101 05/15/2017 1230   CO2 26 05/15/2017 1230   CO2 29 04/27/2016 1352   BUN 21 05/15/2017 1230   BUN 29.5 (H) 04/27/2016  1352   CREATININE 1.66 (H) 05/15/2017 1230   CREATININE 1.66 (H) 05/23/2016 1143   CREATININE 1.3 (H) 04/27/2016 1352      Component Value Date/Time   CALCIUM 9.3 05/15/2017 1230   CALCIUM 8.9 04/27/2016 1352   ALKPHOS 70 05/15/2017 1230   ALKPHOS 62 04/27/2016 1352   AST 23 05/15/2017 1230   AST 28 04/27/2016 1352   ALT 13 05/15/2017 1230   ALT 24 04/27/2016 1352   BILITOT 0.5 05/15/2017 1230   BILITOT 0.81 04/27/2016 1352       Lab Results  Component Value Date   WBC 6.2 05/15/2017   HGB 10.2 (L) 05/15/2017   HCT 31.7 (L) 05/15/2017   MCV 94 05/15/2017   PLT 161 05/15/2017   NEUTROABS 4.2 01/29/2017    ASSESSMENT & PLAN:  Breast cancer of upper-inner quadrant of right female breast ([place]) Right breast biopsy 12:30 position 12/14/2015: 2 masses, 1.6 cm mass: Invasive ductal carcinoma, grade 2, ER 0%, PR 0%, HER-2 negative, Ki-67 70%; satellite mass 8 mm: IDC grade 2 ER 5%, PR 5%, HER-2 negative, Ki-67 50%; T1cN0 stage IA clinical  stage  Right lumpectomy 01/18/2016: Multifocal IDC grade 3, 1.9 cm ER 0%, PR 0%, HER-2 negative, Ki-67 70% and 0.8 cm satellite mass ER 5%, PR 2%, HER-2 negative, Ki-67 50%, high-grade DCIS, margins negative, 0/1 lymph nodes negative T1 cN0 stage IA  DM-2 with CRF: not giving steroids CHF: Cannot give Adriamycin CRF: Will closely monitor Severe peripheral neuropathy: Currently on gabapentin. I renewed her medication today.  Treatment plan: 1. Adjuvant chemotherapy with Taxotere and Cytoxan (3cycles). Adriamycin was not given because of her cardiac problems, Received3 cycles of TC 02/17/16 to 04/06/16 2. Adjuvant radiation 05/24/16 to 07/26/16 3 followed by adjuvant antiestrogen therapy (because a satellite tumor is weakly ER and PR positive). Started 08/23/2016 switched to anastrozole 04/04/2017 ---------------------------------------------------------------------------------- Current treatment: Anti estrogen therapy initially with letrozole and was switched to anastrozole 1 mg p.o. Daily due to dizziness and lightheadedness  Anastrozole toxicities: Profound nausea and vomiting.  Because of this we would like to discontinue anastrozole and put her on exemestane.  She will call us back if exemestane is not covered by her insurance.  If that is the case then we will send her for a prescription for tamoxifen.  Return to clinic in 6 months for follow-up.  I spent 25 minutes talking to the patient of which more than half was spent in counseling and coordination of care.  No orders of the defined types were placed in this encounter.  The patient has a good understanding of the overall plan. she agrees with it. she will call with any problems that may develop before the next visit here.   Rulon Eisenmenger, MD 05/22/17

## 2017-05-22 NOTE — Patient Instructions (Addendum)
Less checking in am and much more after meals  Cheese toast in am  Check blood sugars on waking up 3/7   Also check blood sugars about 2 hours after a meal and do this after different meals by rotation  Recommended blood sugar levels on waking up is 90-130 and about 2 hours after meal is 130-160  Please bring your blood sugar monitor to each visit, thank you  Next Rx for Trulicity will be 1.5mg  weekly

## 2017-05-22 NOTE — Telephone Encounter (Signed)
Patient declined AVs and calendar of upcoming June 2019 appointments.

## 2017-05-22 NOTE — Assessment & Plan Note (Addendum)
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 I renewed her medication today.  Treatment plan: 1. Adjuvant chemotherapy with Taxotere and Cytoxan (3cycles). Adriamycin was not given because of her cardiac problems, Received3 cycles of TC 02/17/16 to 04/06/16 2. Adjuvant radiation 05/24/16 to 07/26/16 3 followed by adjuvant antiestrogen therapy (because a satellite tumor is weakly ER and PR positive). Started 08/23/2016 switched to anastrozole 04/04/2017 ---------------------------------------------------------------------------------- Current treatment: Anti estrogen therapy initially with letrozole and was switched to anastrozole 1 mg p.o. Daily due to dizziness and lightheadedness   Anastrozole toxicities:   Return to clinic in 6 months for follow-up.

## 2017-05-24 ENCOUNTER — Other Ambulatory Visit: Payer: Self-pay

## 2017-05-24 ENCOUNTER — Ambulatory Visit (HOSPITAL_COMMUNITY): Payer: Medicare Other | Attending: Internal Medicine

## 2017-05-24 DIAGNOSIS — I951 Orthostatic hypotension: Secondary | ICD-10-CM

## 2017-05-24 DIAGNOSIS — I359 Nonrheumatic aortic valve disorder, unspecified: Secondary | ICD-10-CM | POA: Diagnosis present

## 2017-05-24 DIAGNOSIS — E119 Type 2 diabetes mellitus without complications: Secondary | ICD-10-CM | POA: Diagnosis not present

## 2017-05-24 DIAGNOSIS — I4891 Unspecified atrial fibrillation: Secondary | ICD-10-CM | POA: Diagnosis not present

## 2017-05-24 DIAGNOSIS — I110 Hypertensive heart disease with heart failure: Secondary | ICD-10-CM | POA: Insufficient documentation

## 2017-05-24 DIAGNOSIS — I5032 Chronic diastolic (congestive) heart failure: Secondary | ICD-10-CM

## 2017-05-24 DIAGNOSIS — I2510 Atherosclerotic heart disease of native coronary artery without angina pectoris: Secondary | ICD-10-CM

## 2017-05-24 DIAGNOSIS — Z8673 Personal history of transient ischemic attack (TIA), and cerebral infarction without residual deficits: Secondary | ICD-10-CM | POA: Insufficient documentation

## 2017-05-24 DIAGNOSIS — Z952 Presence of prosthetic heart valve: Secondary | ICD-10-CM

## 2017-05-28 ENCOUNTER — Encounter: Payer: Self-pay | Admitting: Podiatry

## 2017-05-28 ENCOUNTER — Other Ambulatory Visit: Payer: Self-pay | Admitting: Hematology and Oncology

## 2017-05-28 ENCOUNTER — Ambulatory Visit (INDEPENDENT_AMBULATORY_CARE_PROVIDER_SITE_OTHER): Payer: Medicare Other | Admitting: Podiatry

## 2017-05-28 DIAGNOSIS — L84 Corns and callosities: Secondary | ICD-10-CM

## 2017-05-28 DIAGNOSIS — E1149 Type 2 diabetes mellitus with other diabetic neurological complication: Secondary | ICD-10-CM

## 2017-05-28 NOTE — Patient Instructions (Signed)

## 2017-05-28 NOTE — Progress Notes (Signed)
Subjective: Rhyen presents the office today for further evaluation of the pre-ulcerative callus to the inside aspect of her right fifth toe.  She states the area has not been hurting her and she denies any drainage or redness.  She did notice some bruising to her third and fourth toes last week but she does not recall injuring she does not recall having any pain to the area she is not noticed any swelling. She denies any open sores.  Her last A1c was 7.7.  She does have neuropathy.  She has no other concerns. Denies any systemic complaints such as fevers, chills, nausea, vomiting. No acute changes since last appointment, and no other complaints at this time.   Objective: AAO x3, NAD DP/PT pulses palpable bilaterally, CRT less than 3 seconds Protective sensation decreased with Simms Weinstein monofilament Diversion is present.  There is minimal hyperkeratotic tissue on the medial PIPJ of the right fifth toe.  There is no underlying ulceration, drainage or any signs of infection.  There is no other open lesions or pre-ulcerative lesion identified at this time. There is minimal ecchymosis to the dorsal right third toe but there is no swelling or pain to the area.  The toes are in rectus position.  There is no pain along the metatarsals or bruising or swelling to this area as well. No areas of pinpoint bony tenderness or pain with vibratory sensation. MMT 5/5, ROM WNL. No edema, erythema, increase in warmth to bilateral lower extremities.  No open lesions or pre-ulcerative lesions.  No pain with calf compression, swelling, warmth, erythema  Assessment: Pre-ulcerative callus right fifth toe; toe bruising  Plan: -All treatment options discussed with the patient including all alternatives, risks, complications.  -I sharply debrided the hyperkeratotic tissue the right fifth toes without any complications or bleeding.  Offloading at all times.  There is no skin breakdown but continue to monitor the area  closely. -There is minimal ecchymosis of the third toe on the right side but there is no pain or swelling.  She declines an x-ray today.  If not resolved in the next couple weeks to call the office or sooner if any issues are to arise. -Follow-up in 6 months or sooner if needed.  I will see her back every 6-12 months given her diabetes with neuropathy -Patient encouraged to call the office with any questions, concerns, change in symptoms.   Trula Slade DPM

## 2017-05-31 ENCOUNTER — Ambulatory Visit (INDEPENDENT_AMBULATORY_CARE_PROVIDER_SITE_OTHER): Payer: Medicare Other | Admitting: Internal Medicine

## 2017-05-31 ENCOUNTER — Other Ambulatory Visit: Payer: Self-pay

## 2017-05-31 ENCOUNTER — Encounter: Payer: Self-pay | Admitting: Internal Medicine

## 2017-05-31 VITALS — BP 134/72 | HR 100 | Ht 65.0 in | Wt 195.0 lb

## 2017-05-31 DIAGNOSIS — I951 Orthostatic hypotension: Secondary | ICD-10-CM

## 2017-05-31 DIAGNOSIS — I471 Supraventricular tachycardia: Secondary | ICD-10-CM | POA: Diagnosis not present

## 2017-05-31 DIAGNOSIS — R55 Syncope and collapse: Secondary | ICD-10-CM | POA: Diagnosis not present

## 2017-05-31 DIAGNOSIS — Z951 Presence of aortocoronary bypass graft: Secondary | ICD-10-CM

## 2017-05-31 DIAGNOSIS — Z952 Presence of prosthetic heart valve: Secondary | ICD-10-CM | POA: Diagnosis not present

## 2017-05-31 NOTE — Progress Notes (Signed)
HPI Hannah [name] returns today for ongoing evaluation and management of her palpitations. She has a h/o orthostasis leading to syncope and resting sinus tachycardia and CAD, s/p CABG. She denies anginal symptoms. She has lost about 10 lbs. She has started to exercise but minimally so. No syncope in the interim. When I saw her before we discussed the mechanism of dizzy spells.  Allergies  Allergen Reactions  . Amoxicillin Rash    Has patient had a PCN reaction causing immediate rash, facial/tongue/throat swelling, SOB or lightheadedness with hypotension: No Has patient had a PCN reaction causing severe rash involving mucus membranes or skin necrosis: Yes Has patient had a PCN reaction that required hospitalization No Has patient had a PCN reaction occurring within the last 10 years: No If all of the above answers are "NO", then may proceed with Cephalosporin use.   . Imdur [Isosorbide Dinitrate] Other (See Comments)    Headache/severe hypotension/Syncope  . Arimidex [Anastrozole] Nausea Only  . Latex Itching  . Tetracycline Rash     Current Outpatient Medications  Medication Sig Dispense Refill  . albuterol (PROVENTIL HFA;VENTOLIN HFA) 108 (90 Base) MCG/ACT inhaler Inhale 2 puffs into the lungs every 6 (six) hours as needed for wheezing or shortness of breath.    Marland Kitchen alendronate (FOSAMAX) 70 MG tablet Take 70 mg by mouth every Wednesday. Take with a full glass of water on an empty stomach.     Marland Kitchen amiodarone (PACERONE) 200 MG tablet Take 1 tablet (200 mg total) daily by mouth. 90 tablet 3  . aspirin EC 81 MG EC tablet Take 1 tablet (81 mg total) by mouth daily.    . clopidogrel (PLAVIX) 75 MG tablet Take 1 tablet (75 mg total) by mouth daily. 90 tablet 1  . exemestane (AROMASIN) 25 MG tablet Take 1 tablet (25 mg total) by mouth daily after breakfast. 30 tablet 0  . ezetimibe (ZETIA) 10 MG tablet TAKE 1 TABLET BY MOUTH  DAILY 90 tablet 2  . furosemide (LASIX) 20 MG tablet Take 20 mg by  mouth as needed.    . gabapentin (NEURONTIN) 300 MG capsule Take 1 capsule (300 mg total) by mouth 3 (three) times daily. 90 capsule 0  . glucose blood (ACCU-CHEK GUIDE) test strip Use to test blood sugar 3 times daily 100 each 5  . Insulin Disposable Pump (V-GO 30) KIT Use 1 per day 90 kit 1  . insulin lispro (HUMALOG) 100 UNIT/ML injection Inject 76 units daily with  V-GO pump 70 mL 3  . loratadine (CLARITIN) 10 MG tablet Take 10 mg by mouth daily.    . midodrine (PROAMATINE) 5 MG tablet ONE TABLET BY MOUTH THREE TIMES DAILY WITH MEALS 90 tablet 6  . prochlorperazine (COMPAZINE) 10 MG tablet TAKE 1 TABLET BY MOUTH  EVERY 6 HOURS AS NEEDED FOR NAUSEA OR VOMITING 60 tablet 0  . rosuvastatin (CRESTOR) 40 MG tablet TAKE ONE TABLET BY MOUTH DAILY 90 tablet 3  . SYMBICORT 160-4.5 MCG/ACT inhaler INHALE TWO PUFFS BY MOUTH TWICE DAILY 10.2 g 2  . traMADol (ULTRAM-ER) 200 MG 24 hr tablet     . TRULICITY 1.50 VW/9.7XY SOPN Inject 0.75 mg into the skin every Wednesday. 12 pen 2  . insulin lispro (HUMALOG KWIKPEN) 100 UNIT/ML KiwkPen INJECT 10 UNITS INTO THE SKIN WITH MEALS PLUS SLIDING SCALE (Patient not taking: Reported on 05/31/2017) 15 mL 2   No current facility-administered medications for this visit.      Past  Medical History:  Diagnosis Date  . Abnormally small mouth   . Anemia   . Bilateral carotid artery stenosis    Bilateral ICA 40-59%/  >50% LECA  . Breast cancer ([place]) 01/18/2016   right breast  . Breast cancer of upper-inner quadrant of right female breast ([place]) 12/16/2015  . CAD (coronary artery disease) cardiologist-  dr Aundra Dubin   Remote CABG in 1998 with redo in 2009  . CHF (congestive heart failure) ([facility])    EF is low normal at 50 to 55% per echo in Jan. 2011  . GERD (gastroesophageal reflux disease)   . Hearing loss of right ear   . History of non-ST elevation myocardial infarction (NSTEMI)    Sept 2014--  thought to be type II HTN w/ LHC without infarct related artery and  patent grafts  . History of radiation therapy 05/24/16-07/26/16   right breast 50.4 Gy in 28 fractions, right breast boost 10 Gy in 5 fractions  . Hyperlipidemia   . Hypertension    Resolved.  . Iron deficiency anemia   . Moderate persistent asthma    pulmologist-  Dr. Malvin Johns  . Nonischemic cardiomyopathy ([place])   . PAF (paroxysmal atrial fibrillation) ([place])   . Personal history of chemotherapy 2017  . Personal history of radiation therapy 2017  . Psoriasis    right leg  . Renal calculus, right   . Renal insufficiency, mild   . S/P AVR    prosthesis valve placement 2009 at same time re-do CABG  . Stroke [facility])    residual rt hearing loss  . Type 2 diabetes mellitus ([place])    monitored by dr Dwyane Dee    ROS:   All systems reviewed and negative except as noted in the HPI.   Past Surgical History:  Procedure Laterality Date  . AORTIC VALVE REPLACEMENT  2009   [facility] same [facility] is [facility]  . BREAST LUMPECTOMY Right 01/18/2016  . BREAST LUMPECTOMY WITH RADIOACTIVE SEED AND SENTINEL LYMPH NODE BIOPSY Right 01/18/2016   Procedure: RIGHT BREAST LUMPECTOMY WITH RADIOACTIVE SEED AND SENTINEL LYMPH NODE BIOPSY;  Surgeon: DAlphonsa Overall MD;  [facility]  Service: General;  Laterality: Right;  . CARDIAC CATHETERIZATION  03/23/2008   Pre-redo CABG: L main OK, LAD (T), CFX (T), OM1 99%, RCA (T), LIMA-LAD OK, SVG-OM(?3) OK w/ little florw to OM2, SVG-RCA OK. EF NL  . CARPAL TUNNEL RELEASE    . COLONOSCOPY    . CORONARY ARTERY BYPASS GRAFT  1998 &  re-do 2009   Had LIMA to DX/LAD, [facility] 2 marginal [facility] SVG to 3rd OM and PD at time of redo  . CYSTOSCOPY W/ URETERAL STENT PLACEMENT Right 12/20/2014   Procedure: CYSTOSCOPY WITH RETROGRADE PYELOGRAM/URETERAL STENT PLACEMENT;  Surgeon: PCleon Gustin MD;  [facility]  Service: Urology;  Laterality: Right;  . EYE SURGERY Bilateral    cataracts  .  HOLMIUM LASER APPLICATION Right 71/[id number]/[id number]  Procedure:  HOLMIUM LASER LITHOTRIPSY;  Surgeon: PCleon Gustin MD;  [facility]  Service: Urology;  Laterality: Right;  . LEFT HEART CATHETERIZATION WITH CORONARY/GRAFT ANGIOGRAM N/A 02/23/2013   Procedure: LEFT HEART CATHETERIZATION WITH CBeatrix Fetters  Surgeon: MBlane Ohara MD;  [facility];  Service: Cardiovascular;  Laterality: N/A;  . PORTACATH PLACEMENT Left 01/18/2016   Procedure: INSERTION PORT-A-CATH;  Surgeon: DAlphonsa Overall MD;  [facility]  Service:  General;  Laterality: Left;  . TONSILLECTOMY    . TRANSTHORACIC ECHOCARDIOGRAM  02-24-2013      mild LVH,  ef 50-55%/  AV bioprosthesis was present with very mild stenosis and no regurg., mean grandient 47mHg, peak grandient 241mg /  mild MR/  mild LAE and RAE/  moderate TR  . TUBAL LIGATION       Family History  Problem Relation Age of Onset  . Heart disease Father   . Heart failure Father   . Diabetes Maternal Grandmother   . Heart disease Maternal Grandmother   . Diabetes Son   . Healthy Brother        #1  . Heart attack Brother        #2  . Heart disease Brother        #2     Social History   Socioeconomic History  . Marital status: Married    Spouse name: Not on file  . Number of children: 2  . Years of education: Not on file  . Highest education level: Not on file  Social Needs  . Financial resource strain: Not on file  . Food insecurity - worry: Not on file  . Food insecurity - inability: Not on file  . Transportation needs - medical: Not on file  . Transportation needs - non-medical: Not on file  Occupational History  . Occupation: retired  Tobacco Use  . Smoking status: Never Smoker  . Smokeless tobacco: Never Used  Substance and Sexual Activity  . Alcohol use: No  . Drug use: No  . Sexual activity: Yes    Birth control/protection: None  Other Topics Concern  . Not on file  Social History  Narrative  . Not on file     BP 134/72   Pulse 100   Ht _0  (1.651 m)   Wt 195 lb (88.5 kg)   BMI 32.45 kg/m   Physical Exam:  Well appearing [age]o woman, NAD HEENT: Unremarkable Neck:  6 cm JVD, no thyromegally Lymphatics:  No adenopathy Back:  No CVA tenderness Lungs:  Clear with no wheezes HEART:  Regular rate rhythm, no murmurs, no rubs, no clicks Abd:  soft, positive bowel sounds, no organomegally, no rebound, no guarding Ext:  2 plus pulses, no edema, no cyanosis, no clubbing Skin:  No rashes no nodules Neuro:  CN II through XII intact, motor grossly intact  EKG - NSR at 100  Assess/Plan: 1. orthostasis - her symptoms are improved. I have asked her to start exercising. She will maintain extra fluid and salt in her diet. 2. CAD - she denies anginal symptoms when she walks. 3. HTN - this limits her treatment some. Will follow. She is encouraged to continue her current meds. 4. Atrial arrhythmias - she has improved on the low dose amiodarone. I will continue her current dose of 200 mg daily and down titrate as we are able.  5. Sinus tachycardia - her HR is improved. I have asked her to increase her physical activity.  GrMikle Bosworth.

## 2017-05-31 NOTE — Patient Instructions (Addendum)
Medication Instructions:  Your physician recommends that you continue on your current medications as directed. Please refer to the Current Medication list given to you today.  Labwork: None ordered.  Testing/Procedures: None ordered.  Follow-Up: Your physician wants you to follow-up in: 6 months with Dr. Taylor.   You will receive a reminder letter in the mail two months in advance. If you don't receive a letter, please call our office to schedule the follow-up appointment.  Any Other Special Instructions Will Be Listed Below (If Applicable).  If you need a refill on your cardiac medications before your next appointment, please call your pharmacy.   

## 2017-06-04 ENCOUNTER — Other Ambulatory Visit: Payer: Self-pay | Admitting: Endocrinology

## 2017-06-05 NOTE — Telephone Encounter (Signed)
This is prescribed by another doctor ok to fill or refuse please advise

## 2017-06-07 ENCOUNTER — Other Ambulatory Visit: Payer: Self-pay | Admitting: Endocrinology

## 2017-06-07 ENCOUNTER — Telehealth: Payer: Self-pay | Admitting: Internal Medicine

## 2017-06-07 MED ORDER — MIDODRINE HCL 5 MG PO TABS: 5.0000 mg | ORAL_TABLET | Freq: Two times a day (BID) | ORAL | 6 refills | Status: DC

## 2017-06-07 NOTE — Telephone Encounter (Signed)
S/w pt is aware of [name]'s recommendation's will cut midodrine to bid, medication list updated. If pt's bp goes increases back in 200's this weekend is suggested to go to ER.  Pt will take bp's twice daily everyday this weekend and will call on Monday with readings.

## 2017-06-07 NOTE — Telephone Encounter (Signed)
Let's start weaning Midodrine.  Decrease to BID.  Monitor BP over the weekend and call us with update on Monday.

## 2017-06-07 NOTE — Telephone Encounter (Signed)
To be filled by the prescribing physician

## 2017-06-07 NOTE — Telephone Encounter (Signed)
S/w pt is complaining of insides feel like quivering and shaking.  Bp on Wednesday, pm, Dec 26, 226/90, HR 95, Thursday, pm. bp 236/92,HR 95, today, this am, bp 136/57, HR 114. Pt denies, CP, SOB,chills,fever, is a little nauseated.  Pt was seen in office by Dr. Lovena Le on Dec 21. Will send to [name] to advise.

## 2017-06-07 NOTE — Telephone Encounter (Signed)
New message  Patient calling with BP concerns, felt "weird inside". Patient states her BP is better this morning, but concerned about the spike over the last couple of days.  Please call    Pt c/o BP issue: STAT if pt c/o blurred vision, one-sided weakness or slurred speech  1. What are your last 5 BP readings? 136/62, 231/96, 229/95  2. Are you having any other symptoms (ex. Dizziness, headache, blurred vision, passed out)? No  3. What is your BP issue? High BP

## 2017-06-10 NOTE — Telephone Encounter (Signed)
I think the BP is just fine - would stay on current regimen with no changes made.   While exercise is important, I suspect the HR is more related to her rhythm.   Continue to monitor.

## 2017-06-10 NOTE — Telephone Encounter (Signed)
Patient called in as requested per [name] with bp readings;  Bp readings as follows:  Friday, Dec 28 134/64  HR 113 Sat,  Dec 29 155/58  HR 104 Sun,  Dec 30 140/60  HR 108 Mon,  Dec 31 119/61  HR  113  Pt stated is feeling better is not having any of the shaky feelings in chest.  Pt also stated when brought up to Dr. Lovena Le pt stated Dr. Lovena Le said more exercise and pt's your age need to exercise more and should bring down HR.  Pt is concerned about the elevated HR.  Will send to [name] to advise.

## 2017-06-10 NOTE — Telephone Encounter (Signed)
S/w pt will keep a list of bp readings and is aware of [name]'s recommendation's.  Pt will call office if bp trends up.  Pt is agreeable to treatment plan.

## 2017-06-13 ENCOUNTER — Encounter: Payer: Self-pay | Admitting: Nurse Practitioner

## 2017-06-13 ENCOUNTER — Other Ambulatory Visit: Payer: Self-pay

## 2017-06-13 MED ORDER — EZETIMIBE 10 MG PO TABS: 10.0000 mg | ORAL_TABLET | Freq: Every day | ORAL | 2 refills | Status: DC

## 2017-06-17 ENCOUNTER — Telehealth: Payer: Self-pay | Admitting: *Deleted

## 2017-06-17 ENCOUNTER — Other Ambulatory Visit: Payer: Self-pay | Admitting: *Deleted

## 2017-06-17 MED ORDER — MIDODRINE HCL 5 MG PO TABS: 2.5000 mg | ORAL_TABLET | Freq: Two times a day (BID) | ORAL | 6 refills | Status: DC

## 2017-06-17 NOTE — Telephone Encounter (Signed)
Pt advised to cut midodrine.  No further action needed at this time.

## 2017-06-17 NOTE — Telephone Encounter (Signed)
Let's cut the Midodrine to 2.5 mg BID Have her continue to monitor.

## 2017-06-17 NOTE — Telephone Encounter (Signed)
S/w pt is aware of [name]'s recommendation's, will cut back the midodrine one half tablet (2.5 mg ) bid and will continue to monitor bp.  Will call if episodes happens again and pt is advised if bp is in 200's to go to ER.

## 2017-06-17 NOTE — Telephone Encounter (Signed)
Patient is aware 

## 2017-06-17 NOTE — Telephone Encounter (Signed)
Pt called in today to report last night had another episode of feeling bad, pt took bp; 198/80 and midorine later than usual @ 10:00 pm.  This am bp is 140/61 and pt is feeling better.  Will send to [name] to advise.

## 2017-06-18 ENCOUNTER — Other Ambulatory Visit: Payer: Self-pay | Admitting: Hematology and Oncology

## 2017-06-18 ENCOUNTER — Telehealth: Payer: Self-pay | Admitting: *Deleted

## 2017-06-18 NOTE — Telephone Encounter (Signed)
S/w pt had another episode last night bp was 227/82 HR 101, pt laid and rested in recliner for 45 minutes, and re-evaluated bp which was 169/83  HR 96.  Pt stated did not take midodrine this am, had pt take bp on phone was 134/62 HR 112.  Advised with [name], pt will D/C midodrine.  Will send to [facility] to note.

## 2017-06-18 NOTE — Telephone Encounter (Signed)
STOP Midodrine altogether.   I did talk with Dr. Lovena Le.  Continue to monitor BP and HR. May have to add low dose beta blocker (Toprol/atenolol) but will follow for now.

## 2017-06-20 ENCOUNTER — Other Ambulatory Visit: Payer: Self-pay | Admitting: Hematology and Oncology

## 2017-06-20 ENCOUNTER — Other Ambulatory Visit: Payer: Self-pay | Admitting: Endocrinology

## 2017-06-20 ENCOUNTER — Telehealth: Payer: Self-pay | Admitting: Endocrinology

## 2017-06-20 NOTE — Telephone Encounter (Signed)
When patient came in for appt-she was told she could be switching from V-Go to Omnipod. She filled out the paperwork & sent it in-they called her (before Christmas) to tell her they were processing it-she has not heard from them since- she does Not know what to do because she needs medication soon. Please call patient at ph# [phone] to advise.

## 2017-06-21 MED ORDER — V-GO 40 KIT: PACK | 1 refills | Status: DC

## 2017-06-21 NOTE — Telephone Encounter (Signed)
See message and please advise, Thanks!  

## 2017-06-21 NOTE — Telephone Encounter (Signed)
Patient notified of instructions and voiced understanding. Refill for V-go submitted to [facility] per patient's request.

## 2017-06-21 NOTE — Telephone Encounter (Signed)
She needs to continue the V-go until she is scheduled for training.  She can call [employer] directly to check on the status

## 2017-06-24 ENCOUNTER — Other Ambulatory Visit: Payer: Self-pay | Admitting: Internal Medicine

## 2017-06-24 MED ORDER — ROSUVASTATIN CALCIUM 40 MG PO TABS: 40.0000 mg | ORAL_TABLET | Freq: Every day | ORAL | 3 refills | Status: DC

## 2017-06-24 MED ORDER — AMIODARONE HCL 200 MG PO TABS: 200.0000 mg | ORAL_TABLET | Freq: Every day | ORAL | 3 refills | Status: DC

## 2017-06-28 ENCOUNTER — Other Ambulatory Visit: Payer: Self-pay | Admitting: Nephrology

## 2017-06-28 DIAGNOSIS — N183 Chronic kidney disease, stage 3 unspecified: Secondary | ICD-10-CM

## 2017-07-02 ENCOUNTER — Telehealth: Payer: Self-pay

## 2017-07-02 ENCOUNTER — Ambulatory Visit
Admission: RE | Admit: 2017-07-02 | Discharge: 2017-07-02 | Disposition: A | Discharge status: Home / Self Care | Payer: Medicare Other | Source: Ambulatory Visit | Attending: Nephrology | Admitting: Nephrology

## 2017-07-02 DIAGNOSIS — N183 Chronic kidney disease, stage 3 unspecified: Secondary | ICD-10-CM

## 2017-07-02 NOTE — Telephone Encounter (Signed)
Patient was approved for Omnipod till 06/10/18 under Medicare part D

## 2017-07-08 ENCOUNTER — Other Ambulatory Visit: Payer: Self-pay | Admitting: Endocrinology

## 2017-07-08 DIAGNOSIS — E860 Dehydration: Secondary | ICD-10-CM

## 2017-07-18 ENCOUNTER — Other Ambulatory Visit: Payer: Medicare Other

## 2017-07-23 ENCOUNTER — Ambulatory Visit: Payer: Medicare Other | Admitting: Endocrinology

## 2017-07-23 NOTE — Progress Notes (Addendum)
Subjective:   Hannah [name] is a 72 y.o. female who presents for an Initial Medicare Annual Wellness Visit.  Works one day per week at [employer].  Review of Systems   No ROS.  Medicare Wellness Visit. Additional risk factors are reflected in the social history. Cardiac Risk Factors include: advanced age (>60mn, >>11women);diabetes mellitus;dyslipidemia Sleep patterns: Wakes frequently to urinate. Sleeps 8-10 hrs and feels rested. Home Safety/Smoke Alarms: Feels safe in home. Smoke alarms in place.  Living environment; residence and Firearm Safety: Lives with husband and oldest granddaughter. 1 story home. Seat Belt Safety/Bike Helmet: Wears seat belt.   Female:   Pap- No longer doing routine screening due to age.       Mammo- last 02/06/17: BI-RADS CATEGORY  2: Benign.      Dexa scan- Pt reports last done in 2017      CCS- Last pt reported 06/11/12-normal w/ 10 yr recall  Objective:    Today's Vitals   07/25/17 1447  BP: 132/60  Pulse: 87  SpO2: 98%  Weight: 199 lb 6.4 oz (90.4 kg)  Height: 5' 5" (1.651 m)   Body mass index is 33.18 kg/m.  Advanced Directives 07/25/2017 11/29/2016 10/04/2016 07/26/2016 06/29/2016 06/22/2016 06/22/2016  Does Patient Have a Medical Advance Directive? Yes Yes Yes No No No No  Type of Advance Directive Living will;[place]care [place] of [place] - - - - -  Does patient want to make changes to medical advance directive? No - Patient declined - - - - - -  Copy of [place]? No - copy requested - - - - - -  Would patient like information on creating a medical advance directive? - - - - No - Patient declined No - Patient declined No - Patient declined  Pre-existing out of facility DNR order (yellow form or pink MOST form) - - - - - - -    Current Medications (verified) Outpatient Encounter Medications as of 07/25/2017  Medication Sig  . ACCU-CHEK GUIDE test strip Use to test blood  sugar 3 times daily  . albuterol (PROVENTIL HFA;VENTOLIN HFA) 108 (90 Base) MCG/ACT inhaler Inhale 2 puffs into the lungs every 6 (six) hours as needed for wheezing or shortness of breath.  .Marland Kitchenalendronate (FOSAMAX) 70 MG tablet Take 70 mg by mouth every Wednesday. Take with a full glass of water on an empty stomach.   .Marland Kitchenamiodarone (PACERONE) 200 MG tablet Take 1 tablet (200 mg total) by mouth daily.  .Marland Kitchenaspirin EC 81 MG EC tablet Take 1 tablet (81 mg total) by mouth daily.  . clopidogrel (PLAVIX) 75 MG tablet Take 1 tablet (75 mg total) by mouth daily.  .Marland Kitchenexemestane (AROMASIN) 25 MG tablet TAKE ONE TABLET BY MOUTH DAILY AFTER BREAKFAST  . ezetimibe (ZETIA) 10 MG tablet Take 1 tablet (10 mg total) by mouth daily.  .Marland Kitchengabapentin (NEURONTIN) 300 MG capsule TAKE 1 CAPSULE BY MOUTH 3  TIMES DAILY  . Insulin Disposable Pump (V-GO 40) KIT USE 1 PER DAY  . insulin lispro (HUMALOG KWIKPEN) 100 UNIT/ML KiwkPen INJECT 10 UNITS INTO THE SKIN WITH MEALS PLUS SLIDING SCALE  . insulin lispro (HUMALOG) 100 UNIT/ML injection Inject 76 units daily with  V-GO pump  . loratadine (CLARITIN) 10 MG tablet Take 10 mg by mouth daily.  . rosuvastatin (CRESTOR) 40 MG tablet Take 1 tablet (40 mg total) by mouth daily.  . SYMBICORT 160-4.5 MCG/ACT inhaler  INHALE TWO PUFFS BY MOUTH TWICE DAILY  . traMADol (ULTRAM-ER) 200 MG 24 hr tablet   . TRULICITY 0.94 BS/9.6GE SOPN INJECT SUBCUTANEOUSLY  0.75MG EVERY WEDNESDAY  . furosemide (LASIX) 20 MG tablet Take 20 mg by mouth as needed.  . prochlorperazine (COMPAZINE) 10 MG tablet TAKE 1 TABLET BY MOUTH  EVERY 6 HOURS AS NEEDED FOR NAUSEA OR VOMITING (Patient not taking: Reported on 07/25/2017)   No facility-administered encounter medications on file as of 07/25/2017.     Allergies (verified) Amoxicillin; Imdur [isosorbide dinitrate]; Arimidex [anastrozole]; Latex; and Tetracycline   History: Past Medical History:  Diagnosis Date  . Abnormally small mouth   . Anemia   .  Bilateral carotid artery stenosis    Bilateral ICA 40-59%/  >50% LECA  . Breast cancer ([place]) 01/18/2016   right breast  . Breast cancer of upper-inner quadrant of right female breast ([place]) 12/16/2015  . CAD (coronary artery disease) cardiologist-  dr Aundra Dubin   Remote CABG in 1998 with redo in 2009  . CHF (congestive heart failure) (HCC)    EF is low normal at 50 to 55% per echo in Jan. 2011  . GERD (gastroesophageal reflux disease)   . Hearing loss of right ear   . History of non-ST elevation myocardial infarction (NSTEMI)    Sept 2014--  thought to be type II HTN w/ LHC without infarct related artery and patent grafts  . History of radiation therapy 05/24/16-07/26/16   right breast 50.4 Gy in 28 fractions, right breast boost 10 Gy in 5 fractions  . Hyperlipidemia   . Hypertension    Resolved.  . Iron deficiency anemia   . Moderate persistent asthma    pulmologist-  Dr. Malvin Johns  . Nonischemic cardiomyopathy ([place])   . PAF (paroxysmal atrial fibrillation) ([place])   . Personal history of chemotherapy 2017  . Personal history of radiation therapy 2017  . Psoriasis    right leg  . Renal calculus, right   . Renal insufficiency, mild   . S/P AVR    prosthesis valve placement 2009 at same time re-do CABG  . 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 Type 2 diabetes mellitus ([place])    monitored by dr Dwyane Dee   Past Surgical History:  Procedure Laterality Date  . AORTIC VALVE REPLACEMENT  2009   [facility]done same time is CABG)  . BREAST LUMPECTOMY Right 01/18/2016  . BREAST LUMPECTOMY WITH RADIOACTIVE SEED AND SENTINEL LYMPH NODE BIOPSY Right 01/18/2016   Procedure: RIGHT BREAST LUMPECTOMY WITH RADIOACTIVE SEED AND SENTINEL LYMPH NODE BIOPSY;  Surgeon: DAlphonsa Overall MD;  [place]  Service: General;  Laterality: Right;  . CARDIAC CATHETERIZATION  03/23/2008   Pre-redo CABG: L main OK, LAD (T), CFX (T), OM1 99%, RCA (T), LIMA-LAD OK, SVG-OM(?3) OK w/  little florw to OM2, SVG-RCA OK. EF NL  . CARPAL TUNNEL RELEASE    . COLONOSCOPY    . CORONARY ARTERY BYPASS GRAFT  1998 &  re-do 2009   Had LIMA to DX/LAD, SVG to 2 marginal [facility]; SVG to 3rd OM and PD at time of redo  . CYSTOSCOPY W/ URETERAL STENT PLACEMENT Right 12/20/2014   Procedure: CYSTOSCOPY WITH RETROGRADE PYELOGRAM/URETERAL STENT PLACEMENT;  Surgeon: PCleon Gustin MD;  [facility]  Service: Urology;  Laterality: Right;  . EYE SURGERY Bilateral    cataracts  . HOLMIUM LASER APPLICATION Right  12/20/2014   Procedure:  HOLMIUM LASER LITHOTRIPSY;  Surgeon: Cleon Gustin, MD;  [facility];  Service: Urology;  Laterality: Right;  . LEFT HEART CATHETERIZATION WITH CORONARY/GRAFT ANGIOGRAM N/A 02/23/2013   Procedure: LEFT HEART CATHETERIZATION WITH Beatrix Fetters;  Surgeon: Blane Ohara, MD;  [facility] CATH LAB;  Service: Cardiovascular;  Laterality: N/A;  . PORTACATH PLACEMENT Left 01/18/2016   Procedure: INSERTION PORT-A-CATH;  Surgeon: Alphonsa Overall, MD;  Location: [place];  Service: General;  Laterality: Left;  . portacath removal    . TONSILLECTOMY    . TRANSTHORACIC ECHOCARDIOGRAM  02-24-2013      mild LVH,  ef 50-55%/  AV bioprosthesis was present with very mild stenosis and no regurg., mean grandient 78mHg, peak grandient 261mg /  mild MR/  mild LAE and RAE/  moderate TR  . TUBAL LIGATION     Family History  Problem Relation Age of Onset  . Heart disease Father   . Heart failure Father   . Diabetes Maternal Grandmother   . Heart disease Maternal Grandmother   . Diabetes Son   . Healthy Brother        #1  . Heart attack Brother        #2  . Heart disease Brother        #2   Social History   Socioeconomic History  . Marital status: Married    Spouse name: None  . Number of children: 2  . Years of education: None  . Highest education level: None  Social Needs  .  Financial resource strain: None  . Food insecurity - worry: None  . Food insecurity - inability: None  . Transportation needs - medical: None  . Transportation needs - non-medical: None  Occupational History  . Occupation: retired  Tobacco Use  . Smoking status: Never Smoker  . Smokeless tobacco: Never Used  Substance and Sexual Activity  . Alcohol use: No  . Drug use: No  . Sexual activity: Yes    Birth control/protection: None  Other Topics Concern  . None  Social History Narrative  . None    Tobacco Counseling Counseling given: Not Answered   Clinical Intake: Pain : No/denies pain    Activities of Daily Living In your present state of health, do you have any difficulty performing the following activities: 07/25/2017  Hearing? Y  Comment right hearing loss from many stroke in 2014 per pt. Dr.Rosen w/ ENT  Vision? N  Comment WEars bifocals. Eye exams yearly with Dr.Therman. Next appt in April 2019.  Difficulty concentrating or making decisions? N  Walking or climbing stairs? N  Dressing or bathing? N  Doing errands, shopping? N  Preparing Food and eating ? N  Using the Toilet? N  In the past six months, have you accidently leaked urine? N  Do you have problems with loss of bowel control? N  Managing your Medications? N  Managing your Finances? N  Housekeeping or managing your Housekeeping? N  Some recent data might be hidden     Immunizations and Health Maintenance Immunization History  Administered Date(s) Administered  . Influenza Split 05/11/2012, 02/28/2013, 03/11/2014  . Influenza Whole 03/11/2009, 03/12/2011  . Influenza, High Dose Seasonal PF 05/10/2017  . Influenza,inj,Quad PF,6+ Mos 03/09/2015  . Pneumococcal Conjugate-13 03/11/2013  . Pneumococcal Polysaccharide-23 06/12/2007, 05/11/2012   Health Maintenance Due  Topic Date Due  . Hepatitis C Screening  [birth year]. TETANUS/TDAP  09/09/1964    Patient  Care Team: Saguier, Iris Pert as  PCP - General (Internal Medicine) Alphonsa Overall, MD as Consulting Physician (General Surgery) Nicholas Lose, MD as Consulting Physician (Hematology and Oncology) Gery Pray, MD as Consulting Physician (Radiation Oncology) Evans Lance, MD as Consulting Physician (Cardiology) Collene Gobble, MD as Consulting Physician (Pulmonary Disease) Elayne Snare, MD as Consulting Physician (Endocrinology) Delice Bison, Charlestine Massed, NP as Nurse Practitioner (Hematology and Oncology) Ardelle Balls., MD (Neurology) Izora Gala, MD as Consulting Physician (Otolaryngology) Trula Slade, DPM as Consulting Physician (Podiatry)  Indicate any recent Medical Services you may have received from other than [facility] providers in the past year (date may be approximate).     Assessment:   This is a routine wellness examination for Hannah [name]. Physical assessment deferred to PCP  Hearing/Vision screen Hearing Screening Comments: right hearing loss from many stroke in 2014 per pt. Dr.Rosen w/ ENT Vision Screening Comments: Pt states she gets yearly diabetic eye exam and vision test. Next appt April 2019.  Dietary issues and exercise activities discussed: Current Exercise Habits: Structured exercise class, Type of exercise: treadmill(recumbent bike), Time (Minutes): 30, Frequency (Times/Week): 3, Weekly Exercise (Minutes/Week): 90, Intensity: Mild   Diet (meal preparation, eat out, water intake, caffeinated beverages, dairy products, fruits and vegetables): well balanced   Goals    . Weight (lb) < 185 lb (83.9 kg)     Continue to eat health and exercising 3x/ week.      Depression Screen PHQ 2/9 Scores 07/25/2017 11/29/2016 05/14/2016 10/29/2014  PHQ - 2 Score 0 0 0 0    Fall Risk Fall Risk  07/25/2017 11/29/2016 10/23/2016 05/14/2016 10/29/2014  Falls in the past year? Yes Yes Yes Yes No  Number falls in past yr: _0 or more 1 -  Injury with Fall? No No No No -  Follow up Education provided;Falls  prevention discussed - - - -   Cognitive Function: MMSE - Mini Mental State Exam 07/25/2017  Orientation to time 5  Orientation to Place 5  Registration 3  Attention/ Calculation 5  Recall 3  Language- name 2 objects 2  Language- repeat 1  Language- follow 3 step command 3  Language- read & follow direction 1  Write a sentence 1  Copy design 1  Total score 30        Screening Tests Health Maintenance  Topic Date Due  . Hepatitis C Screening  Nov 09, 1945  . TETANUS/TDAP  09/09/1964  . OPHTHALMOLOGY EXAM  10/24/2017  . HEMOGLOBIN A1C  11/14/2017  . URINE MICROALBUMIN  02/14/2018  . FOOT EXAM  04/11/2018  . MAMMOGRAM  02/07/2019  . COLONOSCOPY  06/11/2022  . INFLUENZA VACCINE  Completed  . DEXA SCAN  Completed  . PNA vac Low Risk Adult  Completed     Plan:   Follow up with PCP as directed  Continue to eat heart healthy diet (full of fruits, vegetables, whole grains, lean protein, water--limit salt, fat, and sugar intake) and increase physical activity as tolerated.  Continue doing brain stimulating activities (puzzles, reading, adult coloring books, staying active) to keep memory sharp.     I have personally reviewed and noted the following in the patient's chart:   . Medical and social history . Use of alcohol, tobacco or illicit drugs  . Current medications and supplements . Functional ability and status . Nutritional status . Physical activity . Advanced directives . List of other physicians . Hospitalizations, surgeries, and ER visits in previous 12 months .  Vitals . Screenings to include cognitive, depression, and falls . Referrals and appointments  In addition, I have reviewed and discussed with patient certain preventive protocols, quality metrics, and best practice recommendations. A written personalized care plan for preventive services as well as general preventive health recommendations were provided to patient.     Shela Nevin,  [place]   07/25/2017   Reviewed and agree with assessment and plan of RN.  Mackie Pai, PA-C

## 2017-07-25 ENCOUNTER — Encounter: Payer: Self-pay | Admitting: *Deleted

## 2017-07-25 ENCOUNTER — Ambulatory Visit (INDEPENDENT_AMBULATORY_CARE_PROVIDER_SITE_OTHER): Payer: Medicare Other | Admitting: *Deleted

## 2017-07-25 VITALS — BP 132/60 | HR 87 | Ht 65.0 in | Wt 199.4 lb

## 2017-07-25 DIAGNOSIS — Z0000 Encounter for general adult medical examination without abnormal findings: Secondary | ICD-10-CM

## 2017-07-25 NOTE — Patient Instructions (Signed)
Continue to eat heart healthy diet (full of fruits, vegetables, whole grains, lean protein, water--limit salt, fat, and sugar intake) and increase physical activity as tolerated.  Continue doing brain stimulating activities (puzzles, reading, adult coloring books, staying active) to keep memory sharp.    Hannah [name] , Thank you for taking time to come for your Medicare Wellness Visit. I appreciate your ongoing commitment to your health goals. Please review the following plan we discussed and let me know if I can assist you in the future.   These are the goals we discussed: Goals    . Weight (lb) < 185 lb (83.9 kg)     Continue to eat health and exercising 3x/ week.       This is a list of the screening recommended for you and due dates:  Health Maintenance  Topic Date Due  .  Hepatitis C: One time screening is recommended by Center for Disease Control  (CDC) for  adults born from [birth year] through 1965.   21-May-1946  . Tetanus Vaccine  09/09/1964  . Eye exam for diabetics  10/24/2017  . Hemoglobin A1C  11/14/2017  . Urine Protein Check  02/14/2018  . Complete foot exam   04/11/2018  . Mammogram  02/07/2019  . Colon Cancer Screening  06/11/2022  . Flu Shot  Completed  . DEXA scan (bone density measurement)  Completed  . Pneumonia vaccines  Completed     Health Maintenance for Postmenopausal Women Menopause is a normal process in which your reproductive ability comes to an end. This process happens gradually over a span of months to years, usually between the ages of 65 and 61. Menopause is complete when you have missed 12 consecutive menstrual periods. It is important to talk with your health care provider about some of the most common conditions that affect postmenopausal women, such as heart disease, cancer, and bone loss (osteoporosis). Adopting a healthy lifestyle and getting preventive care can help to promote your health and wellness. Those actions can also lower your chances of  developing some of these common conditions. What should I know about menopause? During menopause, you may experience a number of symptoms, such as:  Moderate-to-severe hot flashes.  Night sweats.  Decrease in sex drive.  Mood swings.  Headaches.  Tiredness.  Irritability.  Memory problems.  Insomnia.  Choosing to treat or not to treat menopausal changes is an individual decision that you make with your health care provider. What should I know about hormone replacement therapy and supplements? Hormone therapy products are effective for treating symptoms that are associated with menopause, such as hot flashes and night sweats. Hormone replacement carries certain risks, especially as you become older. If you are thinking about using estrogen or estrogen with progestin treatments, discuss the benefits and risks with your health care provider. What should I know about heart disease and stroke? Heart disease, heart attack, and stroke become more likely as you age. This may be due, in part, to the hormonal changes that your body experiences during menopause. These can affect how your body processes dietary fats, triglycerides, and cholesterol. Heart attack and stroke are both medical emergencies. There are many things that you can do to help prevent heart disease and stroke:  Have your blood pressure checked at least every 1-2 years. High blood pressure causes heart disease and increases the risk of stroke.  If you are 1-74 years old, ask your health care provider if you should take aspirin to prevent a heart  attack or a stroke.  Do not use any tobacco products, including cigarettes, chewing tobacco, or electronic cigarettes. If you need help quitting, ask your health care provider.  It is important to eat a healthy diet and maintain a healthy weight. ? Be sure to include plenty of vegetables, fruits, low-fat dairy products, and lean protein. ? Avoid eating foods that are high in solid  fats, added sugars, or salt (sodium).  Get regular exercise. This is one of the most important things that you can do for your health. ? Try to exercise for at least 150 minutes each week. The type of exercise that you do should increase your heart rate and make you sweat. This is known as moderate-intensity exercise. ? Try to do strengthening exercises at least twice each week. Do these in addition to the moderate-intensity exercise.  Know your numbers.Ask your health care provider to check your cholesterol and your blood glucose. Continue to have your blood tested as directed by your health care provider.  What should I know about cancer screening? There are several types of cancer. Take the following steps to reduce your risk and to catch any cancer development as early as possible. Breast Cancer  Practice breast self-awareness. ? This means understanding how your breasts normally appear and feel. ? It also means doing regular breast self-exams. Let your health care provider know about any changes, no matter how small.  If you are 33 or older, have a clinician do a breast exam (clinical breast exam or CBE) every year. Depending on your age, family history, and medical history, it may be recommended that you also have a yearly breast X-ray (mammogram).  If you have a family history of breast cancer, talk with your health care provider about genetic screening.  If you are at high risk for breast cancer, talk with your health care provider about having an MRI and a mammogram every year.  Breast cancer (BRCA) gene test is recommended for women who have family members with BRCA-related cancers. Results of the assessment will determine the need for genetic counseling and BRCA1 and for BRCA2 testing. BRCA-related cancers include these types: ? Breast. This occurs in males or females. ? Ovarian. ? Tubal. This may also be called fallopian tube cancer. ? Cancer of the abdominal or pelvic lining  (peritoneal cancer). ? Prostate. ? Pancreatic.  Cervical, Uterine, and Ovarian Cancer Your health care provider may recommend that you be screened regularly for cancer of the pelvic organs. These include your ovaries, uterus, and vagina. This screening involves a pelvic exam, which includes checking for microscopic changes to the surface of your cervix (Pap test).  For women ages 21-65, health care providers may recommend a pelvic exam and a Pap test every three years. For women ages 85-65, they may recommend the Pap test and pelvic exam, combined with testing for human papilloma virus (HPV), every five years. Some types of HPV increase your risk of cervical cancer. Testing for HPV may also be done on women of any age who have unclear Pap test results.  Other health care providers may not recommend any screening for nonpregnant women who are considered low risk for pelvic cancer and have no symptoms. Ask your health care provider if a screening pelvic exam is right for you.  If you have had past treatment for cervical cancer or a condition that could lead to cancer, you need Pap tests and screening for cancer for at least 20 years after your treatment. If  Pap tests have been discontinued for you, your risk factors (such as having a new sexual partner) need to be reassessed to determine if you should start having screenings again. Some women have medical problems that increase the chance of getting cervical cancer. In these cases, your health care provider may recommend that you have screening and Pap tests more often.  If you have a family history of uterine cancer or ovarian cancer, talk with your health care provider about genetic screening.  If you have vaginal bleeding after reaching menopause, tell your health care provider.  There are currently no reliable tests available to screen for ovarian cancer.  Lung Cancer Lung cancer screening is recommended for adults 69-25 years old who are at  high risk for lung cancer because of a history of smoking. A yearly low-dose CT scan of the lungs is recommended if you:  Currently smoke.  Have a history of at least 30 pack-years of smoking and you currently smoke or have quit within the past 15 years. A pack-year is smoking an average of one pack of cigarettes per day for one year.  Yearly screening should:  Continue until it has been 15 years since you quit.  Stop if you develop a health problem that would prevent you from having lung cancer treatment.  Colorectal Cancer  This type of cancer can be detected and can often be prevented.  Routine colorectal cancer screening usually begins at age 65 and continues through age 37.  If you have risk factors for colon cancer, your health care provider may recommend that you be screened at an earlier age.  If you have a family history of colorectal cancer, talk with your health care provider about genetic screening.  Your health care provider may also recommend using home test kits to check for hidden blood in your stool.  A small camera at the end of a tube can be used to examine your colon directly (sigmoidoscopy or colonoscopy). This is done to check for the earliest forms of colorectal cancer.  Direct examination of the colon should be repeated every 5-10 years until age 42. However, if early forms of precancerous polyps or small growths are found or if you have a family history or genetic risk for colorectal cancer, you may need to be screened more often.  Skin Cancer  Check your skin from head to toe regularly.  Monitor any moles. Be sure to tell your health care provider: ? About any new moles or changes in moles, especially if there is a change in a mole's shape or color. ? If you have a mole that is larger than the size of a pencil eraser.  If any of your family members has a history of skin cancer, especially at a young age, talk with your health care provider about genetic  screening.  Always use sunscreen. Apply sunscreen liberally and repeatedly throughout the day.  Whenever you are outside, protect yourself by wearing long sleeves, pants, a wide-brimmed hat, and sunglasses.  What should I know about osteoporosis? Osteoporosis is a condition in which bone destruction happens more quickly than new bone creation. After menopause, you may be at an increased risk for osteoporosis. To help prevent osteoporosis or the bone fractures that can happen because of osteoporosis, the following is recommended:  If you are 21-73 years old, get at least 1,000 mg of calcium and at least 600 mg of vitamin D per day.  If you are older than age 11 but  younger than age 19, get at least 1,200 mg of calcium and at least 600 mg of vitamin D per day.  If you are older than age 39, get at least 1,200 mg of calcium and at least 800 mg of vitamin D per day.  Smoking and excessive alcohol intake increase the risk of osteoporosis. Eat foods that are rich in calcium and vitamin D, and do weight-bearing exercises several times each week as directed by your health care provider. What should I know about how menopause affects my mental health? Depression may occur at any age, but it is more common as you become older. Common symptoms of depression include:  Low or sad mood.  Changes in sleep patterns.  Changes in appetite or eating patterns.  Feeling an overall lack of motivation or enjoyment of activities that you previously enjoyed.  Frequent crying spells.  Talk with your health care provider if you think that you are experiencing depression. What should I know about immunizations? It is important that you get and maintain your immunizations. These include:  Tetanus, diphtheria, and pertussis (Tdap) booster vaccine.  Influenza every year before the flu season begins.  Pneumonia vaccine.  Shingles vaccine.  Your health care provider may also recommend other  immunizations. This information is not intended to replace advice given to you by your health care provider. Make sure you discuss any questions you have with your health care provider. Document Released: 07/20/2005 Document Revised: 12/16/2015 Document Reviewed: 03/01/2015 Elsevier Interactive Patient Education  2018 [employer].

## 2017-08-01 ENCOUNTER — Other Ambulatory Visit (INDEPENDENT_AMBULATORY_CARE_PROVIDER_SITE_OTHER): Payer: Medicare Other

## 2017-08-01 DIAGNOSIS — Z794 Long term (current) use of insulin: Secondary | ICD-10-CM | POA: Diagnosis not present

## 2017-08-01 DIAGNOSIS — E1165 Type 2 diabetes mellitus with hyperglycemia: Secondary | ICD-10-CM | POA: Diagnosis not present

## 2017-08-01 LAB — LIPID PANEL
Cholesterol: 109 mg/dL (ref 0–200)
HDL: 42.3 mg/dL (ref >39.00)
LDL Cholesterol: 48 mg/dL (ref 0–99)
NonHDL: 66.39
Total CHOL/HDL Ratio: 3
Triglycerides: 90 mg/dL (ref 0.0–149.0)
VLDL: 18 mg/dL (ref 0.0–40.0)

## 2017-08-01 LAB — COMPREHENSIVE METABOLIC PANEL
ALT: 14 U/L (ref 0–35)
AST: 19 U/L (ref 0–37)
Albumin: 3.6 g/dL (ref 3.5–5.2)
Alkaline Phosphatase: 59 U/L (ref 39–117)
BUN: 23 mg/dL (ref 6–23)
CO2: 32 mEq/L (ref 19–32)
Calcium: 9.6 mg/dL (ref 8.4–10.5)
Chloride: 101 mEq/L (ref 96–112)
Creatinine, Ser: 2 mg/dL — ABNORMAL HIGH (ref 0.40–1.20)
GFR: 26.04 mL/min — ABNORMAL LOW (ref >60.00)
Glucose, Bld: 206 mg/dL — ABNORMAL HIGH (ref 70–99)
Potassium: 4.1 mEq/L (ref 3.5–5.1)
Sodium: 137 mEq/L (ref 135–145)
Total Bilirubin: 0.8 mg/dL (ref 0.2–1.2)
Total Protein: 6.6 g/dL (ref 6.0–8.3)

## 2017-08-02 LAB — FRUCTOSAMINE: Fructosamine: 266 umol/L (ref 0–285)

## 2017-08-02 IMAGING — MR MR LUMBAR SPINE W/O CM
4 of 5 series · 23 of 48 positions shown · non-contrast
Comparison: 10/19/2015 plain film examination.  09/06/2013 MR.

CLINICAL DATA: 70-year-old female with centralized lower back pain
that radiates into both legs worse on the left. Occasional numbness
in legs. Subsequent encounter.

EXAM:
MRI LUMBAR SPINE WITHOUT CONTRAST
TECHNIQUE: Multiplanar, multisequence MR imaging of the lumbar spine was
performed. No intravenous contrast was administered.

[Series 3: T2 · sagittal · 4.0mm · 0.55mm/px · 6 of 16 slices shown (1 of 2)]
[im 1/16]
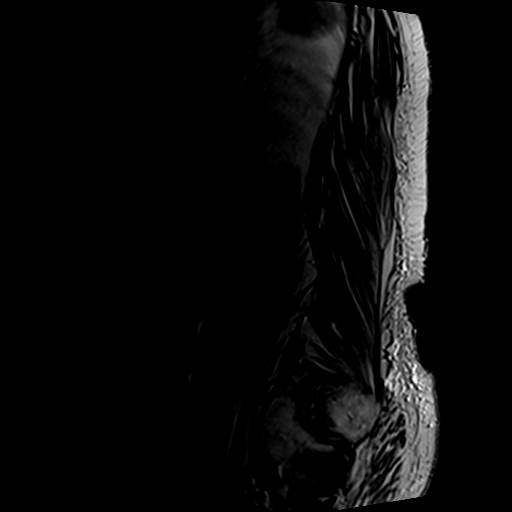
[im 4/16]
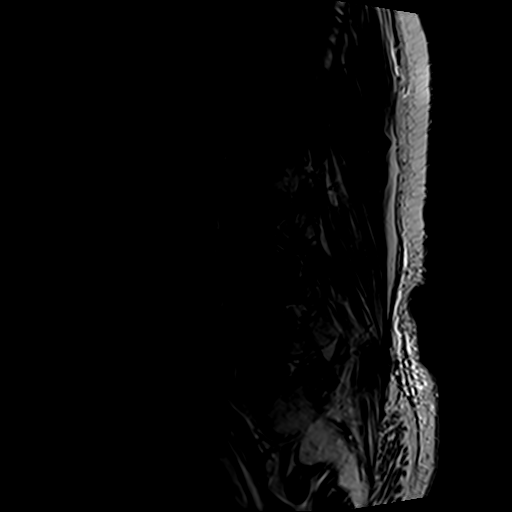
[im 7/16]
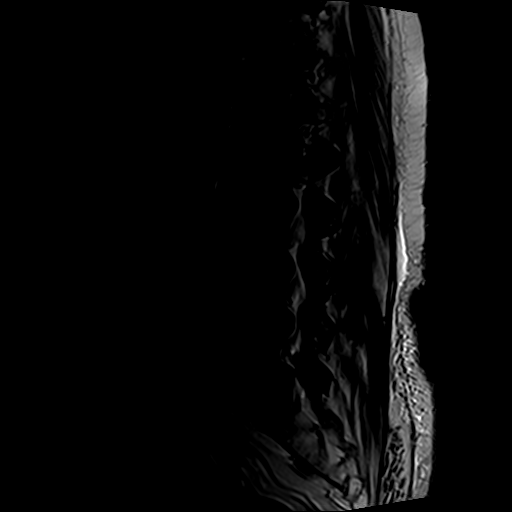
[im 10/16]
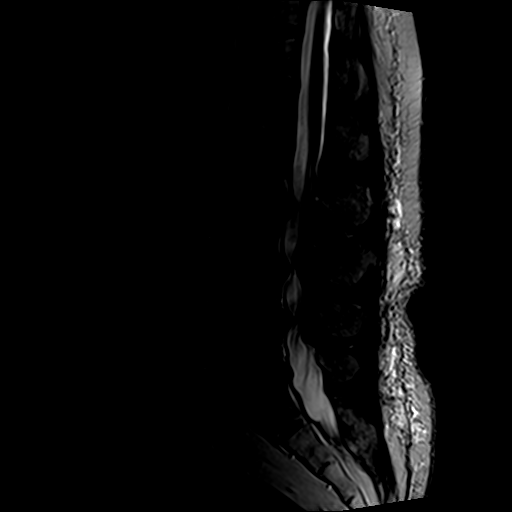
[im 13/16]
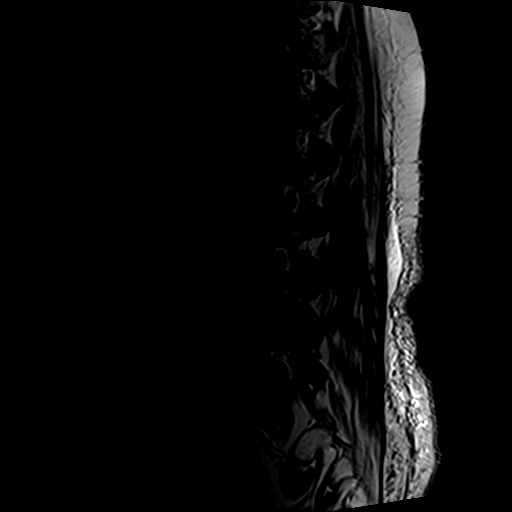
[im 16/16]
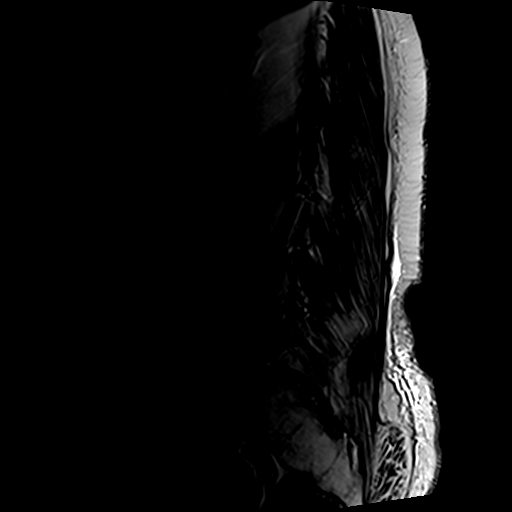

[Series 4: T1 · sagittal · 4.0mm · 0.55mm/px · 6 of 16 slices shown (1 of 2)]
[im 1/16]
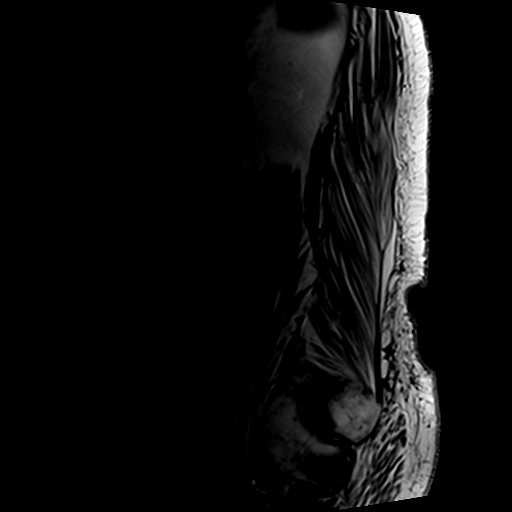
[im 3/16]
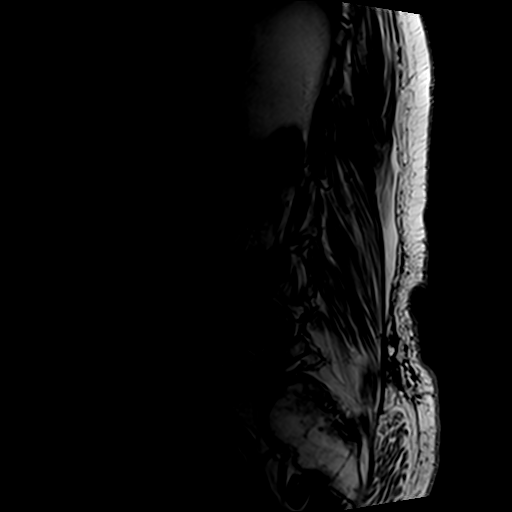
[im 6/16]
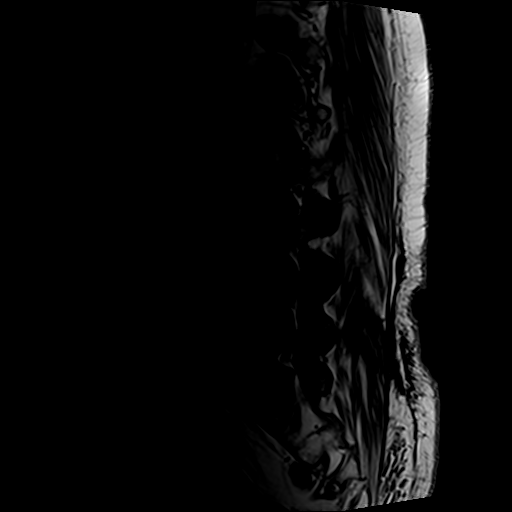
[im 8/16]
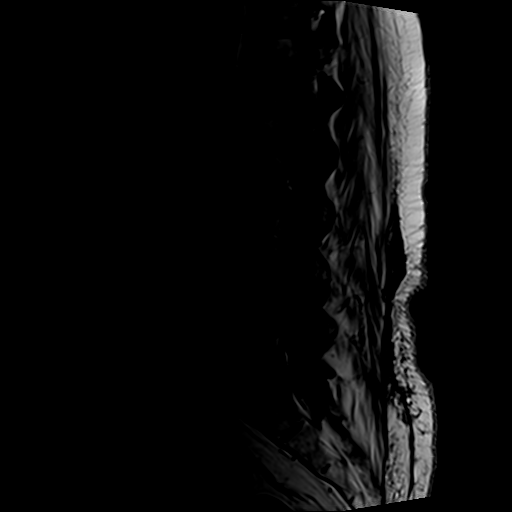
[im 11/16]
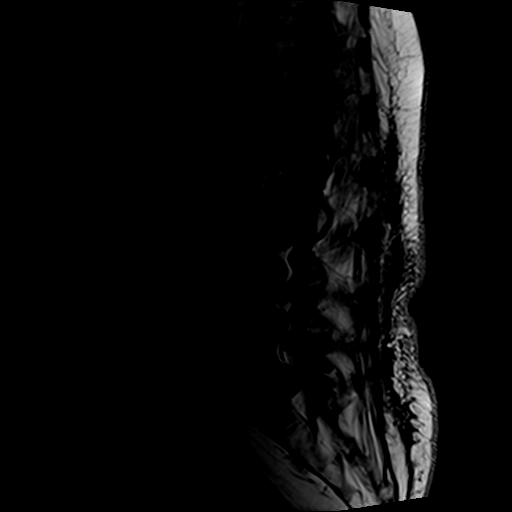
[im 13/16]
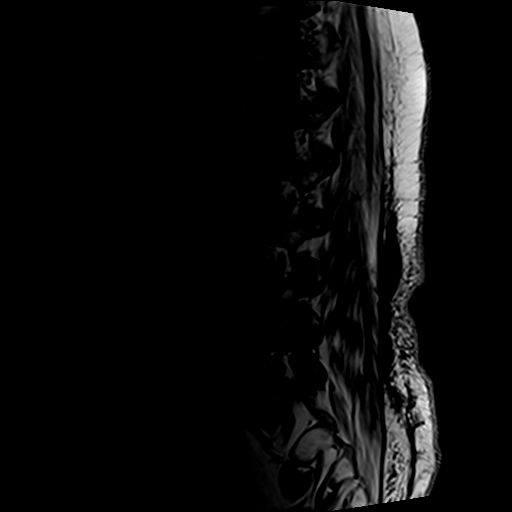

[Series 6: T2 · axial · 4.0mm · 0.70mm/px · z∈[-60,+114]mm · 8 of 33 slices shown (2 of 2)]
[im 1/33]
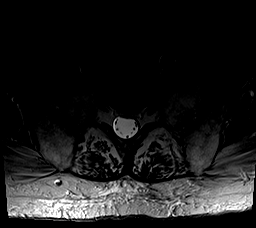
[im 5/33]
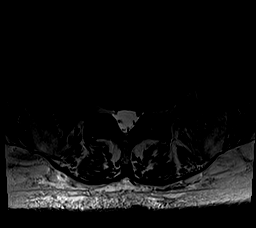
[im 10/33]
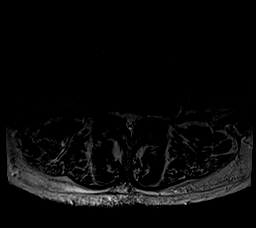
[im 15/33]
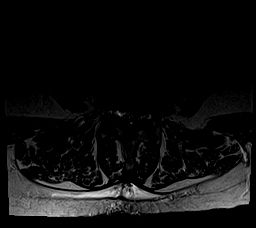
[im 18/33]
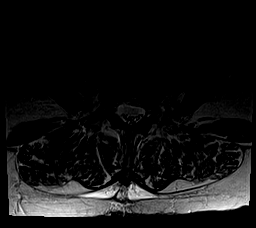
[im 23/33]
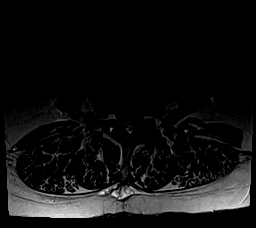
[im 28/33]
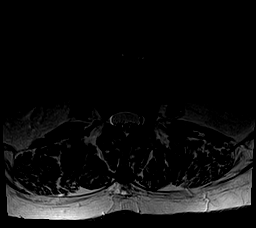
[im 33/33]
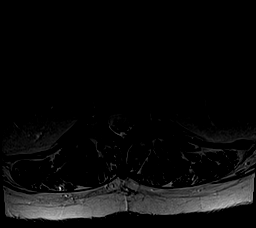

[Series 7: T1 · axial · 4.0mm · 0.35mm/px · z∈[-39,+89]mm · 3 of 33 slices shown (2 of 2)]
[im 5/33]
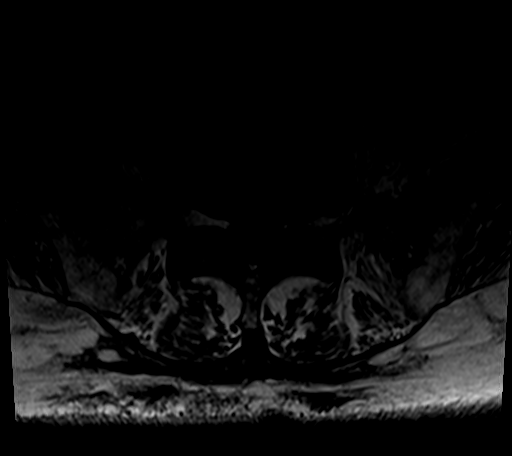
[im 18/33]
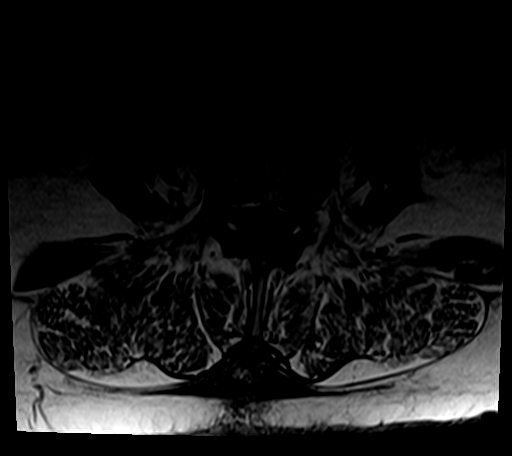
[im 28/33]
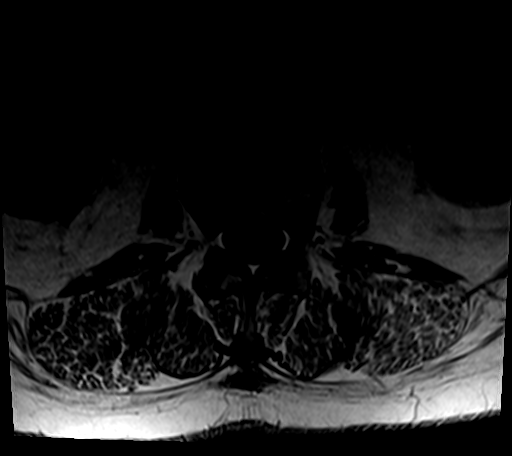

[23 of 48 positions shown; findings below may reference images not displayed]

FINDINGS: Segmentation: Last fully open disk space is labeled L5-S1. Present
examination incorporates from T11-12 disc space through lower
sacrum.

Alignment:  Scoliosis convex left.

Vertebrae: No focal compression fracture. Minimal subchondral edema
right aspect T12 superior endplate suggestive of result of
degenerative changes. Minimal sclerotic focus T12 and L1 unchanged.

Conus medullaris: Extends to the just below the L1-2 disc space
level and appears normal.

Paraspinal and other soft tissues: No worrisome abnormality.

Disc levels:

T11-12: Minimal subchondral edema T12 to the right of midline
probably related to degenerative changes as there is a tiny right
lateral Schmorl's node deformity.

T12-L1:  Negative.

L1-2: Minimal facet degenerative changes. Minimal disc space
narrowing.

L2-3: Moderately large central protrusion superimposed upon bulge.
Minimal cephalad and caudal extension. Protrusion has increased
minimally in size since prior exam. Moderate spinal stenosis.
Bilateral facet degenerative changes and ligamentum flavum
hypertrophy.

L3-4: Moderate facet degenerative changes and ligamentum flavum
hypertrophy. Minimal anterior slip L3. Bulge slightly greater left
foraminal position. Multifactorial mild to moderate thecal sac
narrowing. Mild bilateral foraminal narrowing greater on the left.

L4-5: Prominent disc degeneration endplate reactive changes.
Rotation. Broad-based disc osteophyte complex with minimal cephalad
and caudal extension. Foraminal extension of disc/osteophyte greater
on the right. Facet degenerative changes. Moderate to marked right
foraminal narrowing and encroachment upon the exiting right L4 nerve
root. Mild left foraminal narrowing. Multifactorial mild spinal
stenosis with a slightly trefoil appearance. Lateral recess stenosis
greater on the right.

L5-S1: Disc osteophyte complex with foraminal extension greater on
the left. Facet degenerative changes. Moderate to marked left
foraminal narrowing and encroachment upon exiting left L5 nerve
root. Mild right foraminal narrowing. No significant spinal
stenosis.
IMPRESSION: Scoliosis with superimposed degenerative changes which have
progressed slightly since prior examination. Summary of pertinent
findings includes:

L2-3 moderately large central protrusion. Multifactorial moderate
spinal stenosis.

L3-4 multifactorial mild to moderate thecal sac narrowing. Mild
bilateral foraminal narrowing greater on the left.

L4-5 multifactorial moderate to marked right foraminal narrowing and
encroachment upon the exiting right L4 nerve root. Mild left
foraminal narrowing. Multifactorial mild spinal stenosis with a
slightly trefoil appearance. Lateral recess stenosis greater on the
right.

L5-S1 multifactorial moderate to marked left foraminal narrowing and
encroachment upon exiting left L5 nerve root. Mild right foraminal
narrowing. No significant spinal stenosis.

Please see above for further detail.

## 2017-08-03 ENCOUNTER — Other Ambulatory Visit: Payer: Self-pay | Admitting: Nurse Practitioner

## 2017-08-05 ENCOUNTER — Encounter: Payer: Self-pay | Admitting: Endocrinology

## 2017-08-05 ENCOUNTER — Ambulatory Visit (INDEPENDENT_AMBULATORY_CARE_PROVIDER_SITE_OTHER): Payer: Medicare Other | Admitting: Endocrinology

## 2017-08-05 VITALS — BP 118/50 | HR 96 | Wt 199.2 lb

## 2017-08-05 DIAGNOSIS — N289 Disorder of kidney and ureter, unspecified: Secondary | ICD-10-CM

## 2017-08-05 DIAGNOSIS — E1165 Type 2 diabetes mellitus with hyperglycemia: Secondary | ICD-10-CM

## 2017-08-05 DIAGNOSIS — I10 Essential (primary) hypertension: Secondary | ICD-10-CM

## 2017-08-05 DIAGNOSIS — E782 Mixed hyperlipidemia: Secondary | ICD-10-CM | POA: Diagnosis not present

## 2017-08-05 DIAGNOSIS — Z794 Long term (current) use of insulin: Secondary | ICD-10-CM | POA: Diagnosis not present

## 2017-08-05 MED ORDER — V-GO 30 KIT: 1.0000 | PACK | Freq: Every day | 3 refills | Status: DC

## 2017-08-05 MED ORDER — TRULICITY 0.75 MG/0.5ML ~~LOC~~ SOAJ: 1.5000 mg | SUBCUTANEOUS | 3 refills | Status: DC

## 2017-08-05 MED ORDER — DULAGLUTIDE 1.5 MG/0.5ML ~~LOC~~ SOAJ: SUBCUTANEOUS | 1 refills | Status: DC

## 2017-08-05 MED ORDER — V-GO 40 KIT: PACK | 1 refills | Status: DC

## 2017-08-05 NOTE — Progress Notes (Signed)
Patient ID: Hannah [name], female   DOB: 1946/06/09, 72 y.o.   MRN: [id number]           Reason for Appointment: Follow-up for Type 2 Diabetes   History of Present Illness:          Date of diagnosis of type 2 diabetes mellitus: Late 1980s        Background history:  She was previously treated with metformin prior to starting insulin She thinks she has been on insulin for at least 10 years and mostly taking Lantus and other types of insulin with it Her metformin was stopped when she started insulin and also has had renal dysfunction Previously her A1c had been consistently over 8% and occasionally higher.  Recent history:   INSULIN regimen is described as:  V-go 40 basal, mealtime coverage 8 u acB, lunch 1-2 clicks and 4-19 acS  She has been on the V-go pump since 03/2015  Most recent A1c 7.7 %, previously has been as low as 6.4  Current blood sugar patterns and problems identified:   She now states that if she does not be a significant snack at bedtime she will feel hypoglycemic overnight or have a low blood sugar episode and also have low sugars in the morning  She has been periodically asked to try the 30 unit V-go pump but she says that because she does not get her prescription from her [place] he does not change her pump  Again she does not check readings after meals much and only sporadically around lunchtime  Also has had a couple of relatively low sugars at lunchtime  Occasionally has higher readings over 300 either related to missed bolus or change in diet  Rarely will forget change her pump at night and have a higher readings in the morning  She is not clear why her A1c is higher  Only in the last couple of weeks she has started using an exercise bike at the gym about twice a week  She thinks that she wants to lose weight and has gained 4 pounds  LOWEST blood sugar was 45 at about 2 AM  Non-insulin hypoglycemic drugs the patient is taking are:  Trulicity 3.79 mg weekly  Side effects from medications have been: None  Compliance with the medical regimen: Fair  Glucose monitoring:  done about 1 times a day         Glucometer:  Accu-Chek [facility] Blood Glucose readings by download for the last 4 weeks:  Mean values apply above for all meters except median for One Touch  PRE-MEAL Fasting Lunch Dinner Bedtime Overall  Glucose range: 65-178  68, 119   137-303    Mean/median: 107   203  127   POST-MEAL PC Breakfast PC Lunch PC Dinner  Glucose range:  58-373    Mean/median:      Self-care: The diet that the patient has been following is: tries to limit fats and carbs.  Eating out periodically She uses diet green tea or diet drinks and no sugar in drinks    Lunch 1-2 pm Dinner 6-7 PM; Hs snack   Typical meal intake: Breakfast is oatrmeal or cheese toast              Dietician visit, most recent: 5/16 And in 7/16 with oncology dietitian                Exercise: Gym in 2/19, 2-3/7 days  Weight history:  Wt Readings from  Last 3 Encounters:  08/05/17 199 lb 3.2 oz (90.4 kg)  07/25/17 199 lb 6.4 oz (90.4 kg)  05/31/17 195 lb (88.5 kg)    Glycemic control:      Lab Results  Component Value Date   HGBA1C 7.7 (H) 05/16/2017   HGBA1C 7.0 (H) 02/14/2017   HGBA1C 6.9 (H) 10/09/2016   Lab Results  Component Value Date   MICROALBUR 13.3 (H) 02/14/2017   LDLCALC 48 08/01/2017   CREATININE 2.00 (H) 08/01/2017       Allergies as of 08/05/2017      Reactions   Amoxicillin Rash   Has patient had a PCN reaction causing immediate rash, facial/tongue/throat swelling, SOB or lightheadedness with hypotension: No Has patient had a PCN reaction causing severe rash involving mucus membranes or skin necrosis: Yes Has patient had a PCN reaction that required hospitalization No Has patient had a PCN reaction occurring within the last 10 years: No If all of the above answers are "NO", then may proceed with Cephalosporin use.   Imdur  [isosorbide Dinitrate] Other (See Comments)   Headache/severe hypotension/Syncope   Arimidex [anastrozole] Nausea Only   Latex Itching   Tetracycline Rash      Medication List        Accurate as of 08/05/17  8:51 PM. Always use your most recent med list.          ACCU-CHEK GUIDE test strip Generic drug:  glucose blood Use to test blood sugar 3 times daily   albuterol 108 (90 Base) MCG/ACT inhaler Commonly known as:  PROVENTIL HFA;VENTOLIN HFA Inhale 2 puffs into the lungs every 6 (six) hours as needed for wheezing or shortness of breath.   alendronate 70 MG tablet Commonly known as:  FOSAMAX Take 70 mg by mouth every Wednesday. Take with a full glass of water on an empty stomach.   amiodarone 200 MG tablet Commonly known as:  PACERONE Take 1 tablet (200 mg total) by mouth daily.   aspirin 81 MG EC tablet Take 1 tablet (81 mg total) by mouth daily.   clopidogrel 75 MG tablet Commonly known as:  PLAVIX TAKE 1 TABLET BY MOUTH  DAILY   exemestane 25 MG tablet Commonly known as:  AROMASIN TAKE ONE TABLET BY MOUTH DAILY AFTER BREAKFAST   ezetimibe 10 MG tablet Commonly known as:  ZETIA Take 1 tablet (10 mg total) by mouth daily.   furosemide 20 MG tablet Commonly known as:  LASIX Take 20 mg by mouth as needed.   gabapentin 300 MG capsule Commonly known as:  NEURONTIN TAKE 1 CAPSULE BY MOUTH 3  TIMES DAILY   insulin lispro 100 UNIT/ML KiwkPen Commonly known as:  HUMALOG KWIKPEN INJECT 10 UNITS INTO THE SKIN WITH MEALS PLUS SLIDING SCALE   insulin lispro 100 UNIT/ML injection Commonly known as:  HUMALOG Inject 76 units daily with  V-GO pump   loratadine 10 MG tablet Commonly known as:  CLARITIN Take 10 mg by mouth daily.   prochlorperazine 10 MG tablet Commonly known as:  COMPAZINE TAKE 1 TABLET BY MOUTH  EVERY 6 HOURS AS NEEDED FOR NAUSEA OR VOMITING   rosuvastatin 40 MG tablet Commonly known as:  CRESTOR Take 1 tablet (40 mg total) by mouth daily.     SYMBICORT 160-4.5 MCG/ACT inhaler Generic drug:  budesonide-formoterol INHALE TWO PUFFS BY MOUTH TWICE DAILY   traMADol 200 MG 24 hr tablet Commonly known as:  ULTRAM-ER   TRULICITY 0.24 OX/7.3ZH Sopn Generic drug:  Dulaglutide Inject  1.5 mg into the skin once a week.   V-GO 40 Kit USE 1 PER DAY       Allergies:  Allergies  Allergen Reactions  . Amoxicillin Rash    Has patient had a PCN reaction causing immediate rash, facial/tongue/throat swelling, SOB or lightheadedness with hypotension: No Has patient had a PCN reaction causing severe rash involving mucus membranes or skin necrosis: Yes Has patient had a PCN reaction that required hospitalization No Has patient had a PCN reaction occurring within the last 10 years: No If all of the above answers are "NO", then may proceed with Cephalosporin use.   . Imdur [Isosorbide Dinitrate] Other (See Comments)    Headache/severe hypotension/Syncope  . Arimidex [Anastrozole] Nausea Only  . Latex Itching  . Tetracycline Rash    Past Medical History:  Diagnosis Date  . Abnormally small mouth   . Anemia   . Bilateral carotid artery stenosis    Bilateral ICA 40-59%/  >50% LECA  . Breast cancer ([place]) 01/18/2016   right breast  . Breast cancer of upper-inner quadrant of right female breast ([place]) 12/16/2015  . CAD (coronary artery disease) cardiologist-  dr Aundra Dubin   Remote CABG in 1998 with redo in 2009  . CHF (congestive heart failure) (HCC)    EF is low normal at 50 to 55% per echo in Jan. 2011  . GERD (gastroesophageal reflux disease)   . Hearing loss of right ear   . History of non-ST elevation myocardial infarction (NSTEMI)    Sept 2014--  thought to be type II HTN w/ LHC without infarct related artery and patent grafts  . History of radiation therapy 05/24/16-07/26/16   right breast 50.4 Gy in 28 fractions, right breast boost 10 Gy in 5 fractions  . Hyperlipidemia   . Hypertension    Resolved.  . Iron deficiency anemia    . Moderate persistent asthma    pulmologist-  Dr. Malvin Johns  . Nonischemic cardiomyopathy ([place])   . PAF (paroxysmal atrial fibrillation) ([place])   . Personal history of chemotherapy 2017  . Personal history of radiation therapy 2017  . Psoriasis    right leg  . Renal calculus, right   . Renal insufficiency, mild   . S/P AVR    prosthesis valve placement 2009 at same time re-do CABG  . 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 Type 2 diabetes mellitus ([place])    monitored by dr Dwyane Dee    Past Surgical History:  Procedure Laterality Date  . AORTIC VALVE REPLACEMENT  2009   [facility]done same time is [facility]G)  . BREAST LUMPECTOMY Right 01/18/2016  . BREAST LUMPECTOMY WITH RADIOACTIVE SEED AND SENTINEL LYMPH NODE BIOPSY Right 01/18/2016   Procedure: RIGHT BREAST LUMPECTOMY WITH RADIOACTIVE SEED AND SENTINEL LYMPH NODE BIOPSY;  Surgeon: DAlphonsa Overall MD;  [place]  Service: General;  Laterality: Right;  . CARDIAC CATHETERIZATION  03/23/2008   Pre-redo CABG: L main OK, LAD (T), CFX (T), OM1 99%, RCA (T), LIMA-LAD OK, SVG-OM(?3) OK w/ little florw to OM2, SVG-RCA OK. EF NL  . CARPAL TUNNEL RELEASE    . COLONOSCOPY    . CORONARY ARTERY BYPASS GRAFT  1998 &  re-do 2009   Had LIMA to DX/LAD, SVG to 2 marginal branches and [facility]; SVG to 3rd OM and PD at time of redo  . CYSTOSCOPY W/ URETERAL STENT PLACEMENT Right 12/20/2014   Procedure: CYSTOSCOPY WITH  RETROGRADE PYELOGRAM/URETERAL STENT PLACEMENT;  Surgeon: Cleon Gustin, MD;  Location: [facility];  Service: Urology;  Laterality: Right;  . EYE SURGERY Bilateral    cataracts  . HOLMIUM LASER APPLICATION Right 1/79/1505   Procedure:  HOLMIUM LASER LITHOTRIPSY;  Surgeon: Cleon Gustin, MD;  Location: [facility];  Service: Urology;  Laterality: Right;  . LEFT HEART CATHETERIZATION WITH CORONARY/GRAFT ANGIOGRAM N/A 02/23/2013   Procedure: LEFT HEART  CATHETERIZATION WITH Beatrix Fetters;  Surgeon: Blane Ohara, MD;  Location: [facility] CATH LAB;  Service: Cardiovascular;  Laterality: N/A;  . PORTACATH PLACEMENT Left 01/18/2016   Procedure: INSERTION PORT-A-CATH;  Surgeon: Alphonsa Overall, MD;  Location: [facility];  Service: General;  Laterality: Left;  . portacath removal    . TONSILLECTOMY    . TRANSTHORACIC ECHOCARDIOGRAM  02-24-2013      mild LVH,  ef 50-55%/  AV bioprosthesis was present with very mild stenosis and no regurg., mean grandient 84mHg, peak grandient 264mg /  mild MR/  mild LAE and RAE/  moderate TR  . TUBAL LIGATION      Family History  Problem Relation Age of Onset  . Heart disease Father   . Heart failure Father   . Diabetes Maternal Grandmother   . Heart disease Maternal Grandmother   . Diabetes Son   . Healthy Brother        #1  . Heart attack Brother        #2  . Heart disease Brother        #2    Social History:  reports that  has never smoked. she has never used smokeless tobacco. She reports that she does not drink alcohol or use drugs.    Review of Systems    Lipid history: On 40 mg Crestor along with Zetia      Lab Results  Component Value Date   CHOL 109 08/01/2017   HDL 42.30 08/01/2017   LDLCALC 48 08/01/2017   LDLDIRECT 53.0 10/08/2016   TRIG 90.0 08/01/2017   CHOLHDL 3 08/01/2017           Eyes:  No history of retinopathy  She is followed by nephrologist for renal dysfunction, etiology unclear  Lab Results  Component Value Date   CREATININE 2.00 (H) 08/01/2017   CREATININE 1.66 (H) 05/15/2017   CREATININE 1.44 (H) 02/14/2017    Diabetic foot exam in 7/18: She has  loss of monofilament sensation in her distal feet   Physical Examination:  BP (!) 118/50 (BP Location: Left Arm, Patient Position: Sitting, Cuff Size: Normal)   Pulse 96   Wt 199 lb 3.2 oz (90.4 kg)   SpO2 97%   BMI 33.15 kg/m    ASSESSMENT:  Diabetes type 2, on insulin  Her A1c is higher than usual  at 7.7 This is likely to be from postprandial hyperglycemia as discussed above    See history of present illness for detailed discussion of  current management, blood sugar patterns and problems identified She is also getting excessive tendency to hypoglycemia especially overnight and occasionally before lunch Not always adjusting her boluses based on carbohydrate content and meal size causing variable readings later in the day but cannot be monitored regularly as discussed before Does not understand blood sugar targets at various times  RENAL dysfunction: This appears to be somewhat worse and she needs to discuss with nephrologist   Hyperlipidemia: Well controlled now   PLAN:     She will  try the sample of the 30 unit V-go pump again  Also prescription will be sent to her [place] for this  Emphasized that if she has a lower basal rate she will not get overnight hypoglycemia  She will need to stop eating sweets and snacks especially at bedtime  Also continue exercise regimen and check blood sugars before and after going for exercise  She does need to check her blood sugars consistently 2-3 hours after supper and some other meals to help her adjust her mealtime bolus  Also advised her that unless her blood sugar is 120 or below she does not need to have a snack at bedtime  She will try to remember to change her pump daily and also bolus for all meals and large snacks  She will also benefit from increasing Trulicity up to 1.5 mg since she may have better postprandial readings and should be able to lose weight with overall less insulin  She will call if she has any new problems  Patient Instructions  More sugars 2-3 hrs after dinner  Take 2.7HS Trulicity weekly  Bedtime: can be <120 before snacking       Counseling time on subjects discussed in assessment and plan sections is over 50% of today's 25 minute visit      Elayne Snare 08/05/2017, 8:51 PM   Note:  This office note was prepared with Dragon voice recognition system technology. Any transcriptional errors that result from this process are unintentional.

## 2017-08-05 NOTE — Patient Instructions (Addendum)
More sugars 2-3 hrs after dinner  Take 1.5mg  Trulicity weekly  Bedtime: can be <120 before snacking

## 2017-08-19 ENCOUNTER — Telehealth: Payer: Self-pay | Admitting: Endocrinology

## 2017-08-19 ENCOUNTER — Ambulatory Visit (INDEPENDENT_AMBULATORY_CARE_PROVIDER_SITE_OTHER): Payer: Self-pay

## 2017-08-19 ENCOUNTER — Ambulatory Visit (INDEPENDENT_AMBULATORY_CARE_PROVIDER_SITE_OTHER): Payer: Medicare Other | Admitting: Orthopaedic Surgery

## 2017-08-19 ENCOUNTER — Encounter (INDEPENDENT_AMBULATORY_CARE_PROVIDER_SITE_OTHER): Payer: Self-pay | Admitting: Orthopaedic Surgery

## 2017-08-19 VITALS — Ht 65.0 in | Wt 194.0 lb

## 2017-08-19 DIAGNOSIS — S42295A Other nondisplaced fracture of upper end of left humerus, initial encounter for closed fracture: Secondary | ICD-10-CM | POA: Diagnosis not present

## 2017-08-19 DIAGNOSIS — M25512 Pain in left shoulder: Secondary | ICD-10-CM | POA: Diagnosis not present

## 2017-08-19 MED ORDER — TRAMADOL HCL 50 MG PO TABS: ORAL_TABLET | ORAL | 0 refills | Status: DC

## 2017-08-19 NOTE — Telephone Encounter (Signed)
Patient stated she call this morning about the VGO pump 30, she wanted to get some samples of this till she can receive her prescription on the 3/19, and would like to come pick them up today if possible.   She is not wanting to go back to the 40.    She would like a call back.   Please advise

## 2017-08-19 NOTE — Telephone Encounter (Signed)
This has been resolved V-Go 30 is at the front desk with her name on it and patient is aware

## 2017-08-19 NOTE — Telephone Encounter (Signed)
Patient need a sample of the vgo pump 30, and want to know if her granddaughter can come pick it up. She would like a call back

## 2017-08-19 NOTE — Telephone Encounter (Signed)
Spoke with the patient and she stated that [facility] ran out of V0Go 30 and sent her V-Go 40 instead- she stated her bs drops too low with this and she ended up getting dizzy and falling - when she fell she broke her arm so she is asking for samples of the 30 until [facility] can get the 40's to her on the 19th- I placed some VG-0 30 samples at the front desk with her name on it

## 2017-08-19 NOTE — Progress Notes (Signed)
Office Visit Note   Patient: Hannah [name]           Date of Birth: 1946-01-29           MRN: [id number] Visit Date: 08/19/2017              Requested by: Hannah [name] [address], [place] [address] PCP: Hannah [name]   Assessment & Plan: Visit Diagnoses:  1. Acute pain of left shoulder     Plan: nondisplaced fracture at the humeral head neck junction. We will apply a shoulder immobilizer. Tramadol for pain. Office 1 week for repeat films Follow-Up Instructions: Return in about 1 week (around 08/26/2017).   Orders:  Orders Placed This Encounter  Procedures  . XR Shoulder Left   Meds ordered this encounter  Medications  . traMADol (ULTRAM) 50 MG tablet    Sig: 1 three times a day as needed.    Dispense:  30 tablet    Refill:  0      Procedures: No procedures performed   Clinical Data: No additional findings.   Subjective: Chief Complaint  Patient presents with  . Left Shoulder - Injury    Mrs Hannah [name] is 72 yo f who fell this am and thinks she broke her shoulder.  [name] is a common by her husband and here for evaluation acute onset of left shoulder pain. She fell in her bathroom this morning possibly because of a "low sugar". "I checked my sugar and I amfine now".She had immediate onset of left shoulder pain  HPI  Review of Systems  Constitutional: Negative for fatigue.  HENT: Negative for ear pain.   Eyes: Negative for pain.  Respiratory: Negative for cough and shortness of breath.   Cardiovascular: Negative for leg swelling.  Genitourinary: Negative for dysuria.  Musculoskeletal: Positive for joint swelling.  Neurological: Positive for dizziness, weakness and light-headedness. Negative for numbness.     Objective: Vital Signs: Ht 5\' 5"  (1.651 m)   Wt 194 lb (88 kg)   BMI 32.28 kg/m   Physical Exam  [place]tho [place] alert and oriented 3. Comfortable sitting. Holding her left arm at her side with her  elbow flexed 90.Minimal bruising over the elbow but no particular pain. Pain directly at the left shoulder. No obvious deformity. Large bruise along her left side but no trouble with deep breath.small area of ecchymosis along the lateral left eyebrow.  Specialty Comments:  No specialty comments available.  Imaging: Xr Shoulder Left  Result Date: 08/19/2017 ilms of the left shoulder obtained in several projections. There is essentially nondsplaced fracture of the humeral head neck junction No obvious comminution.    PMFS History: Patient Active Problem List   Diagnosis Date Noted  . Syncope due to orthostatic hypotension 10/23/2016  . Adrenal insufficiency ([place]) 07/03/2016  . Seizure ([place]) 06/29/2016  . Influenza A   . Syncope   . Chemotherapy-induced peripheral neuropathy ([place]) 04/27/2016  . 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 Osteomyelitis of toe of left foot ([place]) 04/06/2016  . Mania ([place]) 03/05/2016  . GERD (gastroesophageal reflux disease) 02/29/2016  . Chemotherapy induced nausea and vomiting 02/24/2016  . Port catheter in place 02/17/2016  . Breast cancer of upper-inner quadrant of right female breast ([place]) 12/16/2015  . CKD (chronic kidney disease) 09/24/2013  . Chronic diastolic CHF (congestive heart failure) ([place]) 09/24/2013  . Unstable angina ([place]) 06/14/2013  . Atrial tachycardia ([place]) 04/03/2013  . Non-ST elevation myocardial infarction (  NSTEMI), initial episode of care ([place]) 02/23/2013  . CAD (coronary artery disease) 01/24/2011  . S/P aortic valve replacement 10/27/2010  . Hyperlipidemia   . SOB (shortness of breath)   . Renal insufficiency, mild   . Nonischemic cardiomyopathy ([place])   . Anemia   . Diabetes mellitus, type 2 ([place])   . Iron deficiency anemia   . Allergic rhinitis 10/14/2009  . Essential hypertension 05/12/2009  . Atrial fibrillation ([place]) 05/12/2009  . Asthma 05/12/2009   Past Medical History:  Diagnosis Date  . Abnormally  small mouth   . Anemia   . Bilateral carotid artery stenosis    Bilateral ICA 40-59%/  >50% LECA  . Breast cancer ([place]) 01/18/2016   right breast  . Breast cancer of upper-inner quadrant of right female breast ([place]) 12/16/2015  . CAD (coronary artery disease) cardiologist-  dr Hannah [name]   Remote CABG in 1998 with redo in 2009  . CHF (congestive heart failure) (HCC)    EF is low normal at 50 to 55% per echo in Jan. 2011  . GERD (gastroesophageal reflux disease)   . Hearing loss of right ear   . History of non-ST elevation myocardial infarction (NSTEMI)    Sept 2014--  thought to be type II HTN w/ LHC without infarct related artery and patent grafts  . History of radiation therapy 05/24/16-07/26/16   right breast 50.4 Gy in 28 fractions, right breast boost 10 Gy in 5 fractions  . Hyperlipidemia   . Hypertension    Resolved.  . Iron deficiency anemia   . Moderate persistent asthma    pulmologist-  Dr. Malvin [name]  . Nonischemic cardiomyopathy ([place])   . PAF (paroxysmal atrial fibrillation) ([place])   . Personal history of chemotherapy 2017  . Personal history of radiation therapy 2017  . Psoriasis    right leg  . Renal calculus, right   . Renal insufficiency, mild   . S/P AVR    prosthesis valve placement 2009 at same time re-do CABG  . Stroke [facility])    residual rt hearing loss  . Type 2 diabetes mellitus ([place])    monitored by dr Hannah [name]    Family History  Problem Relation Age of Onset  . Heart disease Father   . Heart failure Father   . Diabetes Maternal Grandmother   . Heart disease Maternal Grandmother   . Diabetes Son   . Healthy Brother        #1  . Heart attack Brother        #2  . Heart disease Brother        #2    Past Surgical History:  Procedure Laterality Date  . AORTIC VALVE REPLACEMENT  2009   #83mm [facility] Ease pericardial valve (done same time is CABG)  . BREAST LUMPECTOMY Right 01/18/2016  . BREAST LUMPECTOMY WITH RADIOACTIVE SEED AND SENTINEL LYMPH NODE  BIOPSY Right 01/18/2016   Procedure: RIGHT BREAST LUMPECTOMY WITH RADIOACTIVE SEED AND SENTINEL LYMPH NODE BIOPSY;  Surgeon: Hannah Overall, MD;  Location: [facility];  Service: General;  Laterality: Right;  . CARDIAC CATHETERIZATION  03/23/2008   Pre-redo CABG: L main OK, LAD (T), CFX (T), OM1 99%, RCA (T), LIMA-LAD OK, SVG-OM(?3) OK w/ little florw to OM2, SVG-RCA OK. EF NL  . CARPAL TUNNEL RELEASE    . COLONOSCOPY    . CORONARY ARTERY BYPASS GRAFT  1998 &  re-do 2009   Had LIMA to DX/LAD, SVG to 2 marginal branches and SVG  to [facility]; SVG to 3rd OM and PD at time of redo  . CYSTOSCOPY W/ URETERAL STENT PLACEMENT Right 12/20/2014   Procedure: CYSTOSCOPY WITH RETROGRADE PYELOGRAM/URETERAL STENT PLACEMENT;  Surgeon: Cleon Gustin, MD;  Location: [facility];  Service: Urology;  Laterality: Right;  . EYE SURGERY Bilateral    cataracts  . HOLMIUM LASER APPLICATION Right 08/04/8248   Procedure:  HOLMIUM LASER LITHOTRIPSY;  Surgeon: Cleon Gustin, MD;  Location: [facility];  Service: Urology;  Laterality: Right;  . LEFT HEART CATHETERIZATION WITH CORONARY/GRAFT ANGIOGRAM N/A 02/23/2013   Procedure: LEFT HEART CATHETERIZATION WITH Beatrix Fetters;  Surgeon: Blane Ohara, MD;  Location: [facility] CATH LAB;  Service: Cardiovascular;  Laterality: N/A;  . PORTACATH PLACEMENT Left 01/18/2016   Procedure: INSERTION PORT-A-CATH;  Surgeon: Hannah Overall, MD;  Location: [facility];  Service: General;  Laterality: Left;  . portacath removal    . TONSILLECTOMY    . TRANSTHORACIC ECHOCARDIOGRAM  02-24-2013      mild LVH,  ef 50-55%/  AV bioprosthesis was present with very mild stenosis and no regurg., mean grandient 46mmHg, peak grandient 87mmHg /  mild MR/  mild LAE and RAE/  moderate TR  . TUBAL LIGATION     Social History   Occupational History  . Occupation: retired  Tobacco Use  . Smoking status: Never Smoker  . Smokeless tobacco: Never Used  Substance and Sexual  Activity  . Alcohol use: No  . Drug use: No  . Sexual activity: Yes    Birth control/protection: None

## 2017-08-21 ENCOUNTER — Telehealth: Payer: Self-pay | Admitting: Internal Medicine

## 2017-08-21 DIAGNOSIS — R55 Syncope and collapse: Secondary | ICD-10-CM

## 2017-08-21 NOTE — Telephone Encounter (Signed)
New message    Patient calling with concerns of shaking when she is standing and recent episodes of feeling faint. No chest pain, No shortness of breath. Feels faint but has not passed out.

## 2017-08-21 NOTE — Telephone Encounter (Signed)
Pt called stating that she had a syncope episode on Monday, 08/19/17, and broke her shoulder. Per Higinio Roger, NP, pt needs to see Dr. Lovena Le as soon as possible and to order a event monitor. She has been scheduled to see Dr. Lovena Le Monday, 08/26/17 and they will call her to set up the event monitor.  Pt thanked me for my call and all the help she has received.

## 2017-08-22 ENCOUNTER — Telehealth: Payer: Self-pay | Admitting: Neurology

## 2017-08-22 NOTE — Telephone Encounter (Signed)
Patient wants to come in before her appt in may but she does not have anything until sept. Patient is having some problems with shaking and not remembering when it happens. If someone is not there with her she hits the floor and she did that on Monday and broke her arm. She has had many of the spells. Patient does not t remember the fall she only remembers being on the floor. She seems to have more while she is standing up than sitting. She want to speak to someone about this

## 2017-08-26 ENCOUNTER — Ambulatory Visit (INDEPENDENT_AMBULATORY_CARE_PROVIDER_SITE_OTHER): Payer: Medicare Other

## 2017-08-26 ENCOUNTER — Other Ambulatory Visit: Payer: Self-pay | Admitting: Nurse Practitioner

## 2017-08-26 ENCOUNTER — Other Ambulatory Visit: Payer: Self-pay | Admitting: Hematology and Oncology

## 2017-08-26 ENCOUNTER — Encounter: Payer: Self-pay | Admitting: Internal Medicine

## 2017-08-26 ENCOUNTER — Encounter (INDEPENDENT_AMBULATORY_CARE_PROVIDER_SITE_OTHER): Payer: Self-pay | Admitting: Orthopaedic Surgery

## 2017-08-26 ENCOUNTER — Ambulatory Visit (INDEPENDENT_AMBULATORY_CARE_PROVIDER_SITE_OTHER): Payer: Medicare Other | Admitting: Orthopaedic Surgery

## 2017-08-26 ENCOUNTER — Ambulatory Visit (INDEPENDENT_AMBULATORY_CARE_PROVIDER_SITE_OTHER): Payer: Medicare Other | Admitting: Internal Medicine

## 2017-08-26 VITALS — Resp 18 | Ht 65.0 in | Wt 194.0 lb

## 2017-08-26 VITALS — BP 106/62 | HR 100 | Ht 65.0 in | Wt 194.0 lb

## 2017-08-26 DIAGNOSIS — M25512 Pain in left shoulder: Secondary | ICD-10-CM

## 2017-08-26 DIAGNOSIS — R55 Syncope and collapse: Secondary | ICD-10-CM

## 2017-08-26 DIAGNOSIS — I951 Orthostatic hypotension: Secondary | ICD-10-CM | POA: Diagnosis not present

## 2017-08-26 DIAGNOSIS — I471 Supraventricular tachycardia: Secondary | ICD-10-CM | POA: Diagnosis not present

## 2017-08-26 NOTE — Progress Notes (Signed)
Office Visit Note   Patient: Hannah [name]           Date of Birth: 1945-12-23           MRN: [id number] Visit Date: 08/26/2017              Requested by: Mackie Pai, PA-C [address], [place] [address] PCP: Mackie Pai, PA-C   Assessment & Plan: Visit Diagnoses:  1. Acute pain of left shoulder     Plan: 1 week status post nondisplaced left humeral head fracture.  Doing well in shoulder immobilizer.  Difficulty getting comfortable position at night to sleep but otherwise doing well.  Films today reveal no change in position of the nondisplaced fracture.  We will see in 1 week and repeat films  Follow-Up Instructions: Return in about 1 week (around 09/02/2017).   Orders:  Orders Placed This Encounter  Procedures  . XR Shoulder Left   No orders of the defined types were placed in this encounter.     Procedures: No procedures performed   Clinical Data: No additional findings.   Subjective: Chief Complaint  Patient presents with  . Left Shoulder - Pain    Hannah [name] IS 72 Y O F HERE FOR F/U LEFT SHOULDER FX. LAST Monday.  1 week status post injury resulting in a nondisplaced left humeral head fracture.  Relatively comfortable in left upper extremity shoulder immobilizer.  Difficulty sleeping.  No related numbness or tingling  HPI  Review of Systems  Constitutional: Negative for fatigue and fever.  HENT: Negative for ear pain.   Eyes: Negative for pain.  Respiratory: Negative for cough and shortness of breath.   Cardiovascular: Negative for leg swelling.  Gastrointestinal: Negative for constipation and diarrhea.  Genitourinary: Negative for dysuria.  Musculoskeletal: Positive for back pain. Negative for neck pain.  Skin: Negative for rash and wound.  Allergic/Immunologic: Negative for food allergies.  Neurological: Negative for dizziness, weakness, light-headedness, numbness and headaches.  Hematological: Bruises/bleeds easily.    Psychiatric/Behavioral: Negative for sleep disturbance.     Objective: Vital Signs: Resp 18   Ht 5\' 5"  (1.651 m)   Wt 194 lb (88 kg)   BMI 32.28 kg/m   Physical Exam  Ortho Exam exam in shoulder immobilizer left upper extremity.  Good grip and release.  Minimal swelling of fingers.  Neurovascular exam intact solving ecchymosis left arm.  Specialty Comments:  No specialty comments available.  Imaging: Xr Shoulder Left  Result Date: 08/26/2017 Films of the left shoulder obtained in 2 projections.  The previously identified dural head fracture is in excellent position without displacement.    PMFS History: Patient Active Problem List   Diagnosis Date Noted  . Syncope due to orthostatic hypotension 10/23/2016  . Adrenal insufficiency ([place]) 07/03/2016  . Seizure ([place]) 06/29/2016  . Influenza A   . Syncope   . Chemotherapy-induced peripheral neuropathy ([place]) 04/27/2016  . 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 Osteomyelitis of toe of left foot ([place]) 04/06/2016  . Mania ([place]) 03/05/2016  . GERD (gastroesophageal reflux disease) 02/29/2016  . Chemotherapy induced nausea and vomiting 02/24/2016  . Port catheter in place 02/17/2016  . Breast cancer of upper-inner quadrant of right female breast ([place]) 12/16/2015  . CKD (chronic kidney disease) 09/24/2013  . Chronic diastolic CHF (congestive heart failure) ([place]) 09/24/2013  . Unstable angina ([place]) 06/14/2013  . Atrial tachycardia ([place]) 04/03/2013  . Non-ST elevation myocardial infarction (NSTEMI), initial episode of care [facility])  02/23/2013  . CAD (coronary artery disease) 01/24/2011  . S/P aortic valve replacement 10/27/2010  . Hyperlipidemia   . SOB (shortness of breath)   . Renal insufficiency, mild   . Nonischemic cardiomyopathy ([place])   . Anemia   . Diabetes mellitus, type 2 ([place])   . Iron deficiency anemia   . Allergic rhinitis 10/14/2009  . Essential hypertension 05/12/2009  . Atrial fibrillation ([place])  05/12/2009  . Asthma 05/12/2009   Past Medical History:  Diagnosis Date  . Abnormally small mouth   . Anemia   . Bilateral carotid artery stenosis    Bilateral ICA 40-59%/  >50% LECA  . Breast cancer ([place]) 01/18/2016   right breast  . Breast cancer of upper-inner quadrant of right female breast ([place]) 12/16/2015  . CAD (coronary artery disease) cardiologist-  dr Aundra Dubin   Remote CABG in 1998 with redo in 2009  . CHF (congestive heart failure) (HCC)    EF is low normal at 50 to 55% per echo in Jan. 2011  . GERD (gastroesophageal reflux disease)   . Hearing loss of right ear   . History of non-ST elevation myocardial infarction (NSTEMI)    Sept 2014--  thought to be type II HTN w/ LHC without infarct related artery and patent grafts  . History of radiation therapy 05/24/16-07/26/16   right breast 50.4 Gy in 28 fractions, right breast boost 10 Gy in 5 fractions  . Hyperlipidemia   . Hypertension    Resolved.  . Iron deficiency anemia   . Moderate persistent asthma    pulmologist-  Dr. Malvin Johns  . Nonischemic cardiomyopathy ([place])   . PAF (paroxysmal atrial fibrillation) ([place])   . Personal history of chemotherapy 2017  . Personal history of radiation therapy 2017  . Psoriasis    right leg  . Renal calculus, right   . Renal insufficiency, mild   . S/P AVR    prosthesis valve placement 2009 at same time re-do CABG  . 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 Type 2 diabetes mellitus ([place])    monitored by dr Dwyane Dee    Family History  Problem Relation Age of Onset  . Heart disease Father   . Heart failure Father   . Diabetes Maternal Grandmother   . Heart disease Maternal Grandmother   . Diabetes Son   . Healthy Brother        #1  . Heart attack Brother        #2  . Heart disease Brother        #2    Past Surgical History:  Procedure Laterality Date  . AORTIC VALVE REPLACEMENT  2009   #71mm [facility] Ease pericardial valve (done same time is CABG)  . BREAST LUMPECTOMY  Right 01/18/2016  . BREAST LUMPECTOMY WITH RADIOACTIVE SEED AND SENTINEL LYMPH NODE BIOPSY Right 01/18/2016   Procedure: RIGHT BREAST LUMPECTOMY WITH RADIOACTIVE SEED AND SENTINEL LYMPH NODE BIOPSY;  Surgeon: Alphonsa Overall, MD;  Location: [place];  Service: General;  Laterality: Right;  . CARDIAC CATHETERIZATION  03/23/2008   Pre-redo CABG: L main OK, LAD (T), CFX (T), OM1 99%, RCA (T), LIMA-LAD OK, SVG-OM(?3) OK w/ little florw to OM2, SVG-RCA OK. EF NL  . CARPAL TUNNEL RELEASE    . COLONOSCOPY    . CORONARY ARTERY BYPASS GRAFT  1998 &  re-do 2009   Had LIMA to DX/LAD, SVG to 2 marginal branches and SVG to [facility] originally; SVG to 3rd  OM and PD at time of redo  . CYSTOSCOPY W/ URETERAL STENT PLACEMENT Right 12/20/2014   Procedure: CYSTOSCOPY WITH RETROGRADE PYELOGRAM/URETERAL STENT PLACEMENT;  Surgeon: Cleon Gustin, MD;  Location: [facility];  Service: Urology;  Laterality: Right;  . EYE SURGERY Bilateral    cataracts  . HOLMIUM LASER APPLICATION Right 7/62/2633   Procedure:  HOLMIUM LASER LITHOTRIPSY;  Surgeon: Cleon Gustin, MD;  Location: [facility];  Service: Urology;  Laterality: Right;  . LEFT HEART CATHETERIZATION WITH CORONARY/GRAFT ANGIOGRAM N/A 02/23/2013   Procedure: LEFT HEART CATHETERIZATION WITH Beatrix Fetters;  Surgeon: Blane Ohara, MD;  Location: [facility] CATH LAB;  Service: Cardiovascular;  Laterality: N/A;  . PORTACATH PLACEMENT Left 01/18/2016   Procedure: INSERTION PORT-A-CATH;  Surgeon: Alphonsa Overall, MD;  Location: [place];  Service: General;  Laterality: Left;  . portacath removal    . TONSILLECTOMY    . TRANSTHORACIC ECHOCARDIOGRAM  02-24-2013      mild LVH,  ef 50-55%/  AV bioprosthesis was present with very mild stenosis and no regurg., mean grandient 59mmHg, peak grandient 12mmHg /  mild MR/  mild LAE and RAE/  moderate TR  . TUBAL LIGATION     Social History   Occupational History  . Occupation: retired  Tobacco Use  .  Smoking status: Never Smoker  . Smokeless tobacco: Never Used  Substance and Sexual Activity  . Alcohol use: No  . Drug use: No  . Sexual activity: Yes    Birth control/protection: None

## 2017-08-26 NOTE — H&P (View-Only) (Signed)
HPI Hannah [name] returns today for followup of syncope. She has a h/o atrial tachycardia as well as HTN and syncope, thought due to orthostasis. We have adjusted her meds in the past and she did better but a few days ago she was at home and started having another episode and fell to the floor. She is not sure she passed out completely but managed to break her shoulder and have a large bruise/hematoma on her scalp.  Allergies  Allergen Reactions  . Amoxicillin Rash    Has patient had a PCN reaction causing immediate rash, facial/tongue/throat swelling, SOB or lightheadedness with hypotension: No Has patient had a PCN reaction causing severe rash involving mucus membranes or skin necrosis: Yes Has patient had a PCN reaction that required hospitalization No Has patient had a PCN reaction occurring within the last 10 years: No If all of the above answers are "NO", then may proceed with Cephalosporin use.   . Imdur [Isosorbide Dinitrate] Other (See Comments)    Headache/severe hypotension/Syncope  . Arimidex [Anastrozole] Nausea Only  . Latex Itching  . Tetracycline Rash     Current Outpatient Medications  Medication Sig Dispense Refill  . ACCU-CHEK GUIDE test strip Use to test blood sugar 3 times daily 100 each 5  . albuterol (PROVENTIL HFA;VENTOLIN HFA) 108 (90 Base) MCG/ACT inhaler Inhale 2 puffs into the lungs every 6 (six) hours as needed for wheezing or shortness of breath.    Marland Kitchen alendronate (FOSAMAX) 70 MG tablet Take 70 mg by mouth every Wednesday. Take with a full glass of water on an empty stomach.     Marland Kitchen amiodarone (PACERONE) 200 MG tablet Take 1 tablet (200 mg total) by mouth daily. 90 tablet 3  . aspirin EC 81 MG EC tablet Take 1 tablet (81 mg total) by mouth daily.    . clopidogrel (PLAVIX) 75 MG tablet TAKE 1 TABLET BY MOUTH  DAILY 90 tablet 3  . Dulaglutide (TRULICITY) 1.5 BM/8.4XL SOPN Inject contents of pen once a week 12 pen 1  . exemestane (AROMASIN) 25 MG tablet  TAKE ONE TABLET BY MOUTH DAILY AFTER BREAKFAST 90 tablet 0  . ezetimibe (ZETIA) 10 MG tablet Take 1 tablet (10 mg total) by mouth daily. 90 tablet 2  . furosemide (LASIX) 20 MG tablet Take 20 mg by mouth as needed.    . gabapentin (NEURONTIN) 300 MG capsule TAKE 1 CAPSULE BY MOUTH 3  TIMES DAILY 270 capsule 1  . Insulin Disposable Pump (V-GO 30) KIT 1 Device by Does not apply route daily. 90 kit 3  . insulin lispro (HUMALOG) 100 UNIT/ML injection Inject 76 units daily with  V-GO pump 70 mL 3  . loratadine (CLARITIN) 10 MG tablet Take 10 mg by mouth daily.    . prochlorperazine (COMPAZINE) 10 MG tablet TAKE 1 TABLET BY MOUTH  EVERY 6 HOURS AS NEEDED FOR NAUSEA OR VOMITING 60 tablet 0  . rosuvastatin (CRESTOR) 40 MG tablet Take 1 tablet (40 mg total) by mouth daily. 90 tablet 3  . SYMBICORT 160-4.5 MCG/ACT inhaler INHALE TWO PUFFS BY MOUTH TWICE DAILY 10.2 g 2  . traMADol (ULTRAM) 50 MG tablet 1 three times a day as needed. 30 tablet 0  . traMADol (ULTRAM-ER) 200 MG 24 hr tablet      No current facility-administered medications for this visit.      Past Medical History:  Diagnosis Date  . Abnormally small mouth   . Anemia   . Bilateral  carotid artery stenosis    Bilateral ICA 40-59%/  >50% LECA  . Breast cancer ([place]) 01/18/2016   right breast  . Breast cancer of upper-inner quadrant of right female breast ([place]) 12/16/2015  . CAD (coronary artery disease) cardiologist-  dr Aundra Dubin   Remote CABG in 1998 with redo in 2009  . CHF (congestive heart failure) (HCC)    EF is low normal at 50 to 55% per echo in Jan. 2011  . GERD (gastroesophageal reflux disease)   . Hearing loss of right ear   . History of non-ST elevation myocardial infarction (NSTEMI)    Sept 2014--  thought to be type II HTN w/ LHC without infarct related artery and patent grafts  . History of radiation therapy 05/24/16-07/26/16   right breast 50.4 Gy in 28 fractions, right breast boost 10 Gy in 5 fractions  . Hyperlipidemia    . Hypertension    Resolved.  . Iron deficiency anemia   . Moderate persistent asthma    pulmologist-  Dr. Malvin Johns  . Nonischemic cardiomyopathy ([place])   . PAF (paroxysmal atrial fibrillation) ([place])   . Personal history of chemotherapy 2017  . Personal history of radiation therapy 2017  . Psoriasis    right leg  . Renal calculus, right   . Renal insufficiency, mild   . S/P AVR    prosthesis valve placement 2009 at same time re-do CABG  . Stroke [facility])    residual rt hearing loss  . Type 2 diabetes mellitus ([place])    monitored by dr Dwyane Dee    ROS:   All systems reviewed and negative except as noted in the HPI.   Past Surgical History:  Procedure Laterality Date  . AORTIC VALVE REPLACEMENT  2009   [facility] same time is [facility]  . BREAST LUMPECTOMY Right 01/18/2016  . BREAST LUMPECTOMY WITH RADIOACTIVE SEED AND SENTINEL LYMPH NODE BIOPSY Right 01/18/2016   Procedure: RIGHT BREAST LUMPECTOMY WITH RADIOACTIVE SEED AND SENTINEL LYMPH NODE BIOPSY;  Surgeon: DAlphonsa Overall MD;  [place]  Service: General;  Laterality: Right;  . CARDIAC CATHETERIZATION  03/23/2008   Pre-redo CABG: L main OK, LAD (T), CFX (T), OM1 99%, RCA (T), LIMA-LAD OK, SVG-OM(?3) OK w/ little florw to OM2, SVG-RCA OK. EF NL  . CARPAL TUNNEL RELEASE    . COLONOSCOPY    . CORONARY ARTERY BYPASS GRAFT  1998 &  re-do 2009   Had LIMA to DX/LAD, SV[facility] marginal [facility]; SVG to 3rd OM and PD at time of redo  . CYSTOSCOPY W/ URETERAL STENT PLACEMENT Right 12/20/2014   Procedure: CYSTOSCOPY WITH RETROGRADE PYELOGRAM/URETERAL STENT PLACEMENT;  Surgeon: PCleon Gustin MD;  [facility]  Service: Urology;  Laterality: Right;  . EYE SURGERY Bilateral    cataracts  . HOLMIUM LASER APPLICATION Right 74/25/9563  Procedure:  HOLMIUM LASER LITHOTRIPSY;  Surgeon: PCleon Gustin MD;  [facility]  Service:  Urology;  Laterality: Right;  . LEFT HEART CATHETERIZATION WITH CORONARY/GRAFT ANGIOGRAM N/A 02/23/2013   Procedure: LEFT HEART CATHETERIZATION WITH CBeatrix Fetters  Surgeon: MBlane Ohara MD;  [facility];  Service: Cardiovascular;  Laterality: N/A;  . PORTACATH PLACEMENT Left 01/18/2016   Procedure: INSERTION PORT-A-CATH;  Surgeon: DAlphonsa Overall MD;  [place]  Service: General;  Laterality: Left;  . portacath removal    . TONSILLECTOMY    . TRANSTHORACIC  ECHOCARDIOGRAM  02-24-2013      mild LVH,  ef 50-55%/  AV bioprosthesis was present with very mild stenosis and no regurg., mean grandient 18mHg, peak grandient 222mg /  mild MR/  mild LAE and RAE/  moderate TR  . TUBAL LIGATION       Family History  Problem Relation Age of Onset  . Heart disease Father   . Heart failure Father   . Diabetes Maternal Grandmother   . Heart disease Maternal Grandmother   . Diabetes Son   . Healthy Brother        #1  . Heart attack Brother        #2  . Heart disease Brother        #2     Social History   Socioeconomic History  . Marital status: Married    Spouse name: Not on file  . Number of children: 2  . Years of education: Not on file  . Highest education level: Not on file  Social Needs  . Financial resource strain: Not on file  . Food insecurity - worry: Not on file  . Food insecurity - inability: Not on file  . Transportation needs - medical: Not on file  . Transportation needs - non-medical: Not on file  Occupational History  . Occupation: retired  Tobacco Use  . Smoking status: Never Smoker  . Smokeless tobacco: Never Used  Substance and Sexual Activity  . Alcohol use: No  . Drug use: No  . Sexual activity: Yes    Birth control/protection: None  Other Topics Concern  . Not on file  Social History Narrative  . Not on file     Ht 5' 5"  (1.651 m)   BMI 32.28 kg/m   Physical Exam:  Well appearing [age]o woman, NAD HEENT:  Unremarkable Neck:  No JVD, no thyromegally Lymphatics:  No adenopathy Back:  No CVA tenderness Lungs:  Clear with no wheezes HEART:  Regular rate rhythm, no murmurs, no rubs, no clicks Abd:  soft, positive bowel sounds, no organomegally, no rebound, no guarding Ext:  2 plus pulses, no edema, no cyanosis, no clubbing Skin:  No rashes no nodules Neuro:  CN II through XII intact, motor grossly intact  EKG - none   Assess/Plan: 1. Unexplained syncope - the etiology of her symptoms is unclear. She may have a seizure or she may be passing out due to an arrhythmia or symptomatic brady but could also have orthostasis. I have recommended she undergo insertion of an ILR. 2. Atrial tachy - unclear whether this is involved. She will continue low dose amio for now. 3. orthostasis - she is off all meds that might be contributing. If we find her having syncope and no arrhythmias, I would consider starting midodrine. 4. CAD - she denies anginal symptoms. I doubt her arrhythmias are due to VT. Will follow for now.  GrMikle [name].

## 2017-08-26 NOTE — Patient Instructions (Addendum)
Medication Instructions:  Your physician recommends that you continue on your current medications as directed. Please refer to the Current Medication list given to you today.  Labwork: None ordered.  Testing/Procedures: Your physician has recommended that you have a loop recorder placed.  Follow-Up:  You will follow up with the device clinic 10-14 days after your procedure for a wound check.  Your physician wants you to follow-up in: 91 days after your procedure.  Any Other Special Instructions Will Be Listed Below (If Applicable).  Please arrive at the [facility] main entrance of [facility] at:  6:30-7:00 am on August 30, 2017 You can eat You can drink You can take your normal AM meds You can drive  If you need a refill on your cardiac medications before your next appointment, please call your pharmacy.   Implantable Loop Recorder Placement An implantable loop recorder is a small electronic device that is placed under the skin of your chest. It is about the size of an AA ("double A") battery. The device records the electrical activity of your heart over a long period of time. Your health care provider can download these recordings to monitor your heart. You may need an implantable loop recorder if you have periods of abnormal heart activity (arrhythmias) or unexplained fainting (syncope) caused by a heart problem. Tell a health care provider about:  Any allergies you have.  All medicines you are taking, including vitamins, herbs, eye drops, creams, and over-the-counter medicines.  Any problems you or family members have had with anesthetic medicines.  Any blood disorders you have.  Any surgeries you have had.  Any medical conditions you have.  Whether you are pregnant or may be pregnant. What are the risks? Generally, this is a safe procedure. However, as with any procedure, problems may occur, including:  Infection.  Bleeding.  Allergic reactions to  anesthetic medicines.  Damage to nerves or blood vessels.  Failure of the device to work. This could require another surgery to replace it.  What happens before the procedure?   You may have a physical exam, blood tests, and imaging tests of your heart, such as a chest X-ray.  Follow instructions from your health care provider about eating or drinking restrictions.  Ask your health care provider about: ? Changing or stopping your regular medicines. This is especially important if you are taking diabetes medicines or blood thinners. ? Taking medicines such as aspirin and ibuprofen. These medicines can thin your blood. Do not take these medicines before your procedure if your surgeon instructs you not to.  Ask your health care provider how your surgical site will be marked or identified.  You may be given antibiotic medicine to help prevent infection.  Plan to have someone take you home after the procedure.  If you will be going home right after the procedure, plan to have someone with you for 24 hours.  Do not use any tobacco products, such as cigarettes, chewing tobacco, and e-cigarettes as told by your surgeon. If you need help quitting, ask your health care provider. What happens during the procedure?  To reduce your risk of infection: ? Your health care team will wash or sanitize their hands. ? Your skin will be washed with soap.  An IV tube will be inserted into one of your veins.  You may be given an antibiotic medicine through the IV tube.  You may be given one or more of the following: ? A medicine to help you  relax (sedative). ? A medicine to numb the area (local anesthetic).  A small cut (incision) will be made on the left side of your upper chest.  A pocket will be created under your skin.  The device will be placed in the pocket.  The incision will be closed with stitches (sutures) or adhesive strips.  A bandage (dressing) will be placed over the  incision. The procedure may vary among health care providers and hospitals. What happens after the procedure?  Your blood pressure, heart rate, breathing rate, and blood oxygen level will be monitored often until the medicines you were given have worn off.  You may be able to go home on the day of your surgery. Before going home: ? Your health care provider will program your recorder. ? You will learn how to trigger your device with a handheld activator. ? You will learn how to send recordings to your health care provider. ? You will get an ID card for your device, and you will be told when to use it.  Do not drive for 24 hours if you received a sedative. This information is not intended to replace advice given to you by your health care provider. Make sure you discuss any questions you have with your health care provider. Document Released: 05/09/2015 Document Revised: 11/03/2015 Document Reviewed: 03/02/2015 Elsevier Interactive Patient Education  [employer].

## 2017-08-26 NOTE — Progress Notes (Signed)
HPI Hannah [name] returns today for followup of syncope. She has a h/o atrial tachycardia as well as HTN and syncope, thought due to orthostasis. We have adjusted her meds in the past and she did better but a few days ago she was at home and started having another episode and fell to the floor. She is not sure she passed out completely but managed to break her shoulder and have a large bruise/hematoma on her scalp.  Allergies  Allergen Reactions  . Amoxicillin Rash    Has patient had a PCN reaction causing immediate rash, facial/tongue/throat swelling, SOB or lightheadedness with hypotension: No Has patient had a PCN reaction causing severe rash involving mucus membranes or skin necrosis: Yes Has patient had a PCN reaction that required hospitalization No Has patient had a PCN reaction occurring within the last 10 years: No If all of the above answers are "NO", then may proceed with Cephalosporin use.   . Imdur [Isosorbide Dinitrate] Other (See Comments)    Headache/severe hypotension/Syncope  . Arimidex [Anastrozole] Nausea Only  . Latex Itching  . Tetracycline Rash     Current Outpatient Medications  Medication Sig Dispense Refill  . ACCU-CHEK GUIDE test strip Use to test blood sugar 3 times daily 100 each 5  . albuterol (PROVENTIL HFA;VENTOLIN HFA) 108 (90 Base) MCG/ACT inhaler Inhale 2 puffs into the lungs every 6 (six) hours as needed for wheezing or shortness of breath.    Marland Kitchen alendronate (FOSAMAX) 70 MG tablet Take 70 mg by mouth every Wednesday. Take with a full glass of water on an empty stomach.     Marland Kitchen amiodarone (PACERONE) 200 MG tablet Take 1 tablet (200 mg total) by mouth daily. 90 tablet 3  . aspirin EC 81 MG EC tablet Take 1 tablet (81 mg total) by mouth daily.    . clopidogrel (PLAVIX) 75 MG tablet TAKE 1 TABLET BY MOUTH  DAILY 90 tablet 3  . Dulaglutide (TRULICITY) 1.5 GY/6.9SW SOPN Inject contents of pen once a week 12 pen 1  . exemestane (AROMASIN) 25 MG tablet  TAKE ONE TABLET BY MOUTH DAILY AFTER BREAKFAST 90 tablet 0  . ezetimibe (ZETIA) 10 MG tablet Take 1 tablet (10 mg total) by mouth daily. 90 tablet 2  . furosemide (LASIX) 20 MG tablet Take 20 mg by mouth as needed.    . gabapentin (NEURONTIN) 300 MG capsule TAKE 1 CAPSULE BY MOUTH 3  TIMES DAILY 270 capsule 1  . Insulin Disposable Pump (V-GO 30) KIT 1 Device by Does not apply route daily. 90 kit 3  . insulin lispro (HUMALOG) 100 UNIT/ML injection Inject 76 units daily with  V-GO pump 70 mL 3  . loratadine (CLARITIN) 10 MG tablet Take 10 mg by mouth daily.    . prochlorperazine (COMPAZINE) 10 MG tablet TAKE 1 TABLET BY MOUTH  EVERY 6 HOURS AS NEEDED FOR NAUSEA OR VOMITING 60 tablet 0  . rosuvastatin (CRESTOR) 40 MG tablet Take 1 tablet (40 mg total) by mouth daily. 90 tablet 3  . SYMBICORT 160-4.5 MCG/ACT inhaler INHALE TWO PUFFS BY MOUTH TWICE DAILY 10.2 g 2  . traMADol (ULTRAM) 50 MG tablet 1 three times a day as needed. 30 tablet 0  . traMADol (ULTRAM-ER) 200 MG 24 hr tablet      No current facility-administered medications for this visit.      Past Medical History:  Diagnosis Date  . Abnormally small mouth   . Anemia   . Bilateral  carotid artery stenosis    Bilateral ICA 40-59%/  >50% LECA  . Breast cancer ([place]) 01/18/2016   right breast  . Breast cancer of upper-inner quadrant of right female breast ([place]) 12/16/2015  . CAD (coronary artery disease) cardiologist-  dr Aundra Dubin   Remote CABG in 1998 with redo in 2009  . CHF (congestive heart failure) (HCC)    EF is low normal at 50 to 55% per echo in Jan. 2011  . GERD (gastroesophageal reflux disease)   . Hearing loss of right ear   . History of non-ST elevation myocardial infarction (NSTEMI)    Sept 2014--  thought to be type II HTN w/ LHC without infarct related artery and patent grafts  . History of radiation therapy 05/24/16-07/26/16   right breast 50.4 Gy in 28 fractions, right breast boost 10 Gy in 5 fractions  . Hyperlipidemia    . Hypertension    Resolved.  . Iron deficiency anemia   . Moderate persistent asthma    pulmologist-  Dr. Malvin Johns  . Nonischemic cardiomyopathy ([place])   . PAF (paroxysmal atrial fibrillation) ([place])   . Personal history of chemotherapy 2017  . Personal history of radiation therapy 2017  . Psoriasis    right leg  . Renal calculus, right   . Renal insufficiency, mild   . S/P AVR    prosthesis valve placement 2009 at same time re-do CABG  . 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 Type 2 diabetes mellitus ([place])    monitored by dr Dwyane Dee    ROS:   All systems reviewed and negative except as noted in the HPI.   Past Surgical History:  Procedure Laterality Date  . AORTIC VALVE [facility]ACEMENT  [facility]   [facility] is [facility]  . BREAST LUMPECTOMY Right 01/18/2016  . BREAST LUMPECTOMY WITH RADIOACTIVE SEED AND SENTINEL LYMPH NODE BIOPSY Right 01/18/2016   Procedure: RIGHT BREAST LUMPECTOMY WITH RADIOACTIVE SEED AND SENTINEL LYMPH NODE BIOPSY;  Surgeon: DAlphonsa Overall MD;  [place]  Service: General;  Laterality: Right;  . CARDIAC CATHETERIZATION  03/23/2008   Pre-redo CABG: L main OK, LAD (T), CFX (T), OM1 99%, RCA (T), LIMA-LAD OK, SVG-OM(?3) OK w/ little florw to OM2, SVG-RCA OK. EF NL  . CARPAL TUNNEL RELEASE    . COLONOSCOPY    . CORONARY ARTERY BYPASS GRAFT  1998 &  re-do 2009   Had LIMA to DX/LAD, SVG to 2 marginal branches and [facility]; SVG to 3rd OM and PD at time of redo  . CYSTOSCOPY W/ URETERAL STENT PLACEMENT Right 12/20/2014   Procedure: CYSTOSCOPY WITH RETROGRADE PYELOGRAM/URETERAL STENT PLACEMENT;  Surgeon: PCleon Gustin MD;  [facility]  Service: Urology;  Laterality: Right;  . EYE SURGERY Bilateral    cataracts  . HOLMIUM LASER APPLICATION Right 71/28/7867  Procedure:  HOLMIUM LASER LITHOTRIPSY;  Surgeon: PCleon Gustin MD;  [facility]  Service:  Urology;  Laterality: Right;  . LEFT HEART CATHETERIZATION WITH CORONARY/GRAFT ANGIOGRAM N/A 02/23/2013   Procedure: LEFT HEART CATHETERIZATION WITH CBeatrix Fetters  Surgeon: MBlane Ohara MD;  [facility];  Service: Cardiovascular;  Laterality: N/A;  . PORTACATH PLACEMENT Left 01/18/2016   Procedure: INSERTION PORT-A-CATH;  Surgeon: DAlphonsa Overall MD;  [place]  Service: General;  Laterality: Left;  . portacath removal    . TONSILLECTOMY    . TRANSTHORACIC  ECHOCARDIOGRAM  02-24-2013      mild LVH,  ef 50-55%/  AV bioprosthesis was present with very mild stenosis and no regurg., mean grandient 75mHg, peak grandient 236mg /  mild MR/  mild LAE and RAE/  moderate TR  . TUBAL LIGATION       Family History  Problem Relation Age of Onset  . Heart disease Father   . Heart failure Father   . Diabetes Maternal Grandmother   . Heart disease Maternal Grandmother   . Diabetes Son   . Healthy Brother        #1  . Heart attack Brother        #2  . Heart disease Brother        #2     Social History   Socioeconomic History  . Marital status: Married    Spouse name: Not on file  . Number of children: 2  . Years of education: Not on file  . Highest education level: Not on file  Social Needs  . Financial resource strain: Not on file  . Food insecurity - worry: Not on file  . Food insecurity - inability: Not on file  . Transportation needs - medical: Not on file  . Transportation needs - non-medical: Not on file  Occupational History  . Occupation: retired  Tobacco Use  . Smoking status: Never Smoker  . Smokeless tobacco: Never Used  Substance and Sexual Activity  . Alcohol use: No  . Drug use: No  . Sexual activity: Yes    Birth control/protection: None  Other Topics Concern  . Not on file  Social History Narrative  . Not on file     Ht 5' 5"  (1.651 m)   BMI 32.28 kg/m   Physical Exam:  Well appearing [age]o woman, NAD HEENT:  Unremarkable Neck:  No JVD, no thyromegally Lymphatics:  No adenopathy Back:  No CVA tenderness Lungs:  Clear with no wheezes HEART:  Regular rate rhythm, no murmurs, no rubs, no clicks Abd:  soft, positive bowel sounds, no organomegally, no rebound, no guarding Ext:  2 plus pulses, no edema, no cyanosis, no clubbing Skin:  No rashes no nodules Neuro:  CN II through XII intact, motor grossly intact  EKG - none   Assess/Plan: 1. Unexplained syncope - the etiology of her symptoms is unclear. She may have a seizure or she may be passing out due to an arrhythmia or symptomatic brady but could also have orthostasis. I have recommended she undergo insertion of an ILR. 2. Atrial tachy - unclear whether this is involved. She will continue low dose amio for now. 3. orthostasis - she is off all meds that might be contributing. If we find her having syncope and no arrhythmias, I would consider starting midodrine. 4. CAD - she denies anginal symptoms. I doubt her arrhythmias are due to VT. Will follow for now.  GrMikle Bosworth.

## 2017-08-27 ENCOUNTER — Telehealth: Payer: Self-pay | Admitting: Internal Medicine

## 2017-08-27 ENCOUNTER — Ambulatory Visit (INDEPENDENT_AMBULATORY_CARE_PROVIDER_SITE_OTHER): Payer: Medicare Other | Admitting: Neurology

## 2017-08-27 ENCOUNTER — Other Ambulatory Visit: Payer: Self-pay

## 2017-08-27 VITALS — HR 103 | Ht 65.0 in | Wt 194.0 lb

## 2017-08-27 DIAGNOSIS — I951 Orthostatic hypotension: Secondary | ICD-10-CM | POA: Diagnosis not present

## 2017-08-27 DIAGNOSIS — R404 Transient alteration of awareness: Secondary | ICD-10-CM

## 2017-08-27 NOTE — Telephone Encounter (Signed)
New message   Patient calling for instructions for 3/22 procedure.

## 2017-08-27 NOTE — Patient Instructions (Signed)
1. Schedule 48-hour EEG 2. Proceed with loop recorder 3. Increase fluid intake 4. As per [place] driving laws, no driving after an episode of loss of awareness/consciousness, until 6 months event-free 5. Follow-up after EEG, call for any changes

## 2017-08-27 NOTE — Progress Notes (Signed)
NEUROLOGY FOLLOW UP OFFICE NOTE  Hannah [name] [id number] 1946-04-11  HISTORY OF PRESENT ILLNESS: I had the pleasure of seeing Hannah [name] in follow-up in the neurology clinic on 08/27/2017.  The patient was last seen almost a year ago for syncope, felt to be due to orthostatic hypotension. They denied any episodes of loss of consciousness for almost a year until Monday last week, she recalls coming out of the bathroom and having a sensation of falling. She states she had no recollection of events for a minute. Her husband jumped out of the bed as soon as he heard the fall, she was on the floor with eyes open but not responding. He sat her up and noted upper body jerking (he demonstrates shoulder movements) lasting less than a minute. They realized she had broken her left arm and bruised the left side of her head. He checked her BP and noted it was 101/36 with HR 109-11, but states they were told this was a false reading. Since then, she has been having the same episodes where she feels like someone is choking her voicebox, her husband describes it as her words slow down and she drags the words out. They last for a minute, this morning she had one while sitting. When she walks, she has a sensation where she is going to fall, her legs shake like they are going to buckle. Her husband has seen these and notes that she would not respond for a minute, looking right through you. He has not seen these staring spells when supine or sitting. Crisoforo Oxford she is standing, her knees buckle and she tries to stop and has to be help. She had one or two of them today, yesterday she had one. They seem to occur with no rhyme or reason.   HPI 10/23/2016: This is a very pleasant 72 yo RH woman with a history of CAD s/p CABG x 2, diabetes on disposable insulin pump, paroxysmal atrial fibrillation, s/p aortic valve replacement, hypertension, and breast cancer s/p chemotherapy and radiation, who had an episode of loss of  consciousness while at her radiation appointment last 06/29/2016. She was sitting in a wheelchair when she became unresponsive, eyes were open, glassy, followed by bilateral upper extremity shaking and vomiting after. She was confused for a few minutes after and was amnestic of the event. Vital signs were stable, blood sugar was 206. She was brought to the ER where she had another episode of unresponsiveness with no associated shaking, followed by brief confusion. While she was admitted, she had an event coming back from the bathroom when she started looking down and became unresponsive with some clonic movement of one hand for 1 minute. She had an EEG which showed mild diffuse slowing with no epileptiform discharges. I personally reviewed MRI brain with and without contrast, there was significant movement artifact, but there were no acute changes seen, no abnormal enhancement noted. She was initially started on Vimpat, however during her stay she was found to be orthostatic with decline in SBP from 130s to 60s upon standing, there was also note of documented arrhythmias at that time. Vimpat was discontinued. She was advised follow-up with outpatient neurology to continue to monitor should symptoms continue despite treatment of orthostasis and arrhythmias. She has seen Cardiology and continues on midodrine, Sotalol, and prn Lasix. She feels the medication is helping, she reports her lowest BP at home is around 110/45 to 50. She has had only one more syncopal episode that occurred  the week after hospital discharge in January . She was standing in the bedroom then her husband saw a change in her face and sat her down, she briefly passed out. She reports presyncopal episodes continue, she gets lightheaded while standing then shaky in her legs, sitting down with resolution of symptoms. Every once in a while the quiver occurs in her arms, resolving when she sits down. She had a fall 3 weeks ago and does not think she  passed out, she feels it was due to her neuropathy, her legs did not move when she tried to turn and fell down. No significant injuries.  She reports a history of a "mini-stroke" 7-8 years ago that affected her left side and caused hearing loss on the right. She was told the stroke was in the back of the brain. Around that time, she also started having episodes of passing out but without any shaking. After a month or so, they stopped. She was evaluated by neurologist Dr. Justine Null and recall having an ambulatory EEG done which "did not find anything." She has noticed occasional gaps in time occurring around twice a month. About a couple of times a month, she would smell something other people don't, reports they are different smells each time. She denies any rising epigastric sensation, myoclonic jerks. 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 She feels her strength has been low since she finished radiation mid-February. She denies any diplopia, dysarthria, neck pain, bowel/bladder dysfunction. She feels something gets stuck in her throat when swallowing. She has chronic back pain. She had a normal birth and early development.  There is no history of febrile convulsions, CNS infections such as meningitis/encephalitis, significant traumatic brain injury, neurosurgical procedures, or family history of seizures.  PAST MEDICAL HISTORY: Past Medical History:  Diagnosis Date  . Abnormally small mouth   . Anemia   . Bilateral carotid artery stenosis    Bilateral ICA 40-59%/  >50% LECA  . Breast cancer ([place]) 01/18/2016   right breast  . Breast cancer of upper-inner quadrant of right female breast ([place]) 12/16/2015  . CAD (coronary artery disease) cardiologist-  dr Aundra Dubin   Remote CABG in 1998 with redo in 2009  . CHF (congestive heart failure) (HCC)    EF is low normal at 50 to 55% per echo in Jan. 2011  . GERD (gastroesophageal reflux disease)   . Hearing loss of right ear   . History of non-ST elevation  myocardial infarction (NSTEMI)    Sept 2014--  thought to be type II HTN w/ LHC without infarct related artery and patent grafts  . History of radiation therapy 05/24/16-07/26/16   right breast 50.4 Gy in 28 fractions, right breast boost 10 Gy in 5 fractions  . Hyperlipidemia   . Hypertension    Resolved.  . Iron deficiency anemia   . Moderate persistent asthma    pulmologist-  Dr. Malvin Johns  . Nonischemic cardiomyopathy ([place])   . PAF (paroxysmal atrial fibrillation) ([place])   . Personal history of chemotherapy 2017  . Personal history of radiation therapy 2017  . Psoriasis    right leg  . Renal calculus, right   . Renal insufficiency, mild   . S/P AVR    prosthesis valve placement 2009 at same time re-do CABG  . 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 Type 2 diabetes mellitus ([place])    monitored by dr Dwyane Dee    MEDICATIONS: Current Outpatient Medications on File Prior  to Visit  Medication Sig Dispense Refill  . ACCU-CHEK GUIDE test strip Use to test blood sugar 3 times daily 100 each 5  . albuterol (PROVENTIL HFA;VENTOLIN HFA) 108 (90 Base) MCG/ACT inhaler Inhale 2 puffs into the lungs every 6 (six) hours as needed for wheezing or shortness of breath.    Marland Kitchen amiodarone (PACERONE) 200 MG tablet Take 1 tablet (200 mg total) by mouth daily. 90 tablet 3  . aspirin EC 81 MG EC tablet Take 1 tablet (81 mg total) by mouth daily.    . clopidogrel (PLAVIX) 75 MG tablet TAKE 1 TABLET BY MOUTH  DAILY (Patient taking differently: TAKE 75 MG BY MOUTH  DAILY) 90 tablet 3  . Dulaglutide (TRULICITY) 1.5 LK/4.4WN SOPN Inject contents of pen once a week (Patient taking differently: Inject 1.5 mg into the skin every Wednesday. ) 12 pen 1  . exemestane (AROMASIN) 25 MG tablet TAKE ONE TABLET BY MOUTH DAILY AFTER BREAKFAST (Patient taking differently: TAKE 25 MG BY MOUTH DAILY AFTER BREAKFAST) 90 tablet 0  . ezetimibe (ZETIA) 10 MG tablet Take 1 tablet (10 mg total) by mouth daily. 90 tablet 2  .  furosemide (LASIX) 20 MG tablet Take 20 mg by mouth daily as needed for fluid.     Marland Kitchen gabapentin (NEURONTIN) 300 MG capsule TAKE 1 CAPSULE BY MOUTH 3  TIMES DAILY (Patient taking differently: TAKE 300 MG BY MOUTH 3 TIMES DAILY) 270 capsule 1  . Insulin Disposable Pump (V-GO 30) KIT 1 Device by Does not apply route daily. 90 kit 3  . insulin lispro (HUMALOG) 100 UNIT/ML injection Inject 76 units daily with  V-GO pump 70 mL 3  . levocetirizine (XYZAL) 5 MG tablet Take 5 mg by mouth daily.    . prochlorperazine (COMPAZINE) 10 MG tablet TAKE 1 TABLET BY MOUTH  EVERY 6 HOURS AS NEEDED FOR NAUSEA OR VOMITING (Patient taking differently: TAKE 10 MG BY MOUTH  EVERY 6 HOURS AS NEEDED FOR NAUSEA OR VOMITING) 60 tablet 0  . rosuvastatin (CRESTOR) 40 MG tablet Take 1 tablet (40 mg total) by mouth daily. 90 tablet 3  . SYMBICORT 160-4.5 MCG/ACT inhaler INHALE TWO PUFFS BY MOUTH TWICE DAILY 10.2 g 2  . traMADol (ULTRAM) 50 MG tablet 1 three times a day as needed. (Patient taking differently: Take 50 mg by mouth 2 (two) times daily as needed for moderate pain. ) 30 tablet 0   No current facility-administered medications on file prior to visit.     ALLERGIES: Allergies  Allergen Reactions  . Amoxicillin Rash and Other (See Comments)    Has patient had a PCN reaction causing immediate rash, facial/tongue/throat swelling, SOB or lightheadedness with hypotension: No Has patient had a PCN reaction causing severe rash involving mucus membranes or skin necrosis: Yes Has patient had a PCN reaction that required hospitalization No Has patient had a PCN reaction occurring within the last 10 years: No If all of the above answers are "NO", then may proceed with Cephalosporin use.   . Imdur [Isosorbide Dinitrate] Other (See Comments)    Headache/severe hypotension/Syncope  . Arimidex [Anastrozole] Nausea Only  . Latex Itching  . Tetracycline Rash    FAMILY HISTORY: Family History  Problem Relation Age of Onset    . Heart disease Father   . Heart failure Father   . Diabetes Maternal Grandmother   . Heart disease Maternal Grandmother   . Diabetes Son   . Healthy Brother        #1  .  Heart attack Brother        #2  . Heart disease Brother        #2    SOCIAL HISTORY: Social History   Socioeconomic History  . Marital status: Married    Spouse name: Not on file  . Number of children: 2  . Years of education: Not on file  . Highest education level: Not on file  Social Needs  . Financial resource strain: Not on file  . Food insecurity - worry: Not on file  . Food insecurity - inability: Not on file  . Transportation needs - medical: Not on file  . Transportation needs - non-medical: Not on file  Occupational History  . Occupation: retired  Tobacco Use  . Smoking status: Never Smoker  . Smokeless tobacco: Never Used  Substance and Sexual Activity  . Alcohol use: No  . Drug use: No  . Sexual activity: Yes    Birth control/protection: None  Other Topics Concern  . Not on file  Social History Narrative  . Not on file    REVIEW OF SYSTEMS: Constitutional: No fevers, chills, or sweats, no generalized fatigue, change in appetite Eyes: No visual changes, double vision, eye pain Ear, nose and throat: No hearing loss, ear pain, nasal congestion, sore throat Cardiovascular: No chest pain, palpitations Respiratory:  No shortness of breath at rest or with exertion, wheezes GastrointestinaI: No nausea, vomiting, diarrhea, abdominal pain, fecal incontinence Genitourinary:  No dysuria, urinary retention or frequency Musculoskeletal:  No neck pain, back pain Integumentary: No rash, pruritus, skin lesions Neurological: as above Psychiatric: No depression, insomnia, anxiety Endocrine: No palpitations, fatigue, diaphoresis, mood swings, change in appetite, change in weight, increased thirst Hematologic/Lymphatic:  No anemia, purpura, petechiae. Allergic/Immunologic: no itchy/runny eyes, nasal  congestion, recent allergic reactions, rashes  PHYSICAL EXAM: Vitals:   08/27/17 0943  Pulse: (!) 103  SpO2: 97%   Unable to obtain BP, s/p breast surgery on right, left arm in sling General: No acute distress Head:  Normocephalic/atraumatic Neck: supple, no paraspinal tenderness, full range of motion Heart:  Regular rate and rhythm Lungs:  Clear to auscultation bilaterally Back: No paraspinal tenderness Skin/Extremities: No rash, no edema. Left arm in sling Neurological Exam: alert and oriented to person, place, and time. No aphasia or dysarthria. Fund of knowledge is appropriate.  Recent and remote memory are intact.  Attention and concentration are normal.    Able to name objects and repeat phrases. Cranial nerves: Pupils equal, round, reactive to light.  Extraocular movements intact with no nystagmus. Visual fields full. Facial sensation intact. No facial asymmetry. Tongue, uvula, palate midline.  Motor: Bulk and tone normal, muscle strength 5/5 throughout with no pronator drift, except unable to test left UE in sling  Sensation to light touch intact.  No extinction to double simultaneous stimulation.  Deep tendon reflexes +1 throughout, toes downgoing.  Finger to nose testing intact.  Gait narrow-based and steady, able to tandem walk adequately.  Romberg negative.  IMPRESSION: This is a very pleasant 72 yo RH woman with a history of CAD s/p CABG x 2, diabetes on disposable insulin pump, paroxysmal atrial fibrillation, s/p aortic valve replacement, hypertension, and breast cancer s/p chemotherapy and radiation, who presented almost a year ago with episodes of unresponsiveness. Several were captured during her hospital stay, and she was found to have orthostatic hypotension and atrial arrhythmia. Her MRI brain was unremarkable, EEG showed mild diffuse slowing. She was doing well for almost a year, until last  week when she had a fall and broke her left arm. Her husband witnessed a few body jerks  that time. Her husband also describes episodes of staring, choking sensation/voice constriction. The etiology of symptoms is unclear, she does have orthostatic hypotension which can cause syncope/convulsive syncope, however the choking sensation can be seen with insular seizures. A 48-hour EEG will be ordered to further classify her symptoms. She is scheduled for a loop recorder through Cardiology. She was advised to increase fluid intake, liberalize salt intake. We discussed [place] driving laws to stop driving after an episode of loss of consciousness, until 6 months event-free. She will follow-up after the EEG and knows to call for any changes.   Thank you for allowing me to participate in her care.  Please do not hesitate to call for any questions or concerns.  The duration of this appointment visit was 25 minutes of face-to-face time with the patient.  Greater than 50% of this time was spent in counseling, explanation of diagnosis, planning of further management, and coordination of care.   Ellouise Newer, M.D.   CC: Mackie Pai, PA-C

## 2017-08-27 NOTE — Telephone Encounter (Signed)
Left detailed VM for DPR. There are NO special instructions for a loop implant. Just arrive at admitting at 6:30 am on August 30, 2017.   Left this nurse name and # if any further questions.

## 2017-08-30 ENCOUNTER — Encounter (HOSPITAL_COMMUNITY)
Admission: RE | Disposition: A | Discharge status: Home / Self Care | Payer: Self-pay | Source: Ambulatory Visit | Attending: Internal Medicine

## 2017-08-30 ENCOUNTER — Encounter (HOSPITAL_COMMUNITY): Payer: Self-pay | Admitting: Internal Medicine

## 2017-08-30 ENCOUNTER — Ambulatory Visit (HOSPITAL_COMMUNITY)
Admission: RE | Admit: 2017-08-30 | Discharge: 2017-08-30 | Disposition: A | Discharge status: Home / Self Care | Payer: Medicare Other | Source: Ambulatory Visit | Attending: Internal Medicine | Admitting: Internal Medicine

## 2017-08-30 DIAGNOSIS — Z91040 Latex allergy status: Secondary | ICD-10-CM | POA: Insufficient documentation

## 2017-08-30 DIAGNOSIS — E785 Hyperlipidemia, unspecified: Secondary | ICD-10-CM | POA: Diagnosis not present

## 2017-08-30 DIAGNOSIS — I509 Heart failure, unspecified: Secondary | ICD-10-CM | POA: Diagnosis not present

## 2017-08-30 DIAGNOSIS — Z952 Presence of prosthetic heart valve: Secondary | ICD-10-CM | POA: Insufficient documentation

## 2017-08-30 DIAGNOSIS — K219 Gastro-esophageal reflux disease without esophagitis: Secondary | ICD-10-CM | POA: Diagnosis not present

## 2017-08-30 DIAGNOSIS — L409 Psoriasis, unspecified: Secondary | ICD-10-CM | POA: Diagnosis not present

## 2017-08-30 DIAGNOSIS — I428 Other cardiomyopathies: Secondary | ICD-10-CM | POA: Insufficient documentation

## 2017-08-30 DIAGNOSIS — I2510 Atherosclerotic heart disease of native coronary artery without angina pectoris: Secondary | ICD-10-CM | POA: Diagnosis not present

## 2017-08-30 DIAGNOSIS — Z8249 Family history of ischemic heart disease and other diseases of the circulatory system: Secondary | ICD-10-CM | POA: Insufficient documentation

## 2017-08-30 DIAGNOSIS — I6523 Occlusion and stenosis of bilateral carotid arteries: Secondary | ICD-10-CM | POA: Insufficient documentation

## 2017-08-30 DIAGNOSIS — Z880 Allergy status to penicillin: Secondary | ICD-10-CM | POA: Insufficient documentation

## 2017-08-30 DIAGNOSIS — H918X1 Other specified hearing loss, right ear: Secondary | ICD-10-CM | POA: Insufficient documentation

## 2017-08-30 DIAGNOSIS — I69398 Other sequelae of cerebral infarction: Secondary | ICD-10-CM | POA: Diagnosis not present

## 2017-08-30 DIAGNOSIS — Z7982 Long term (current) use of aspirin: Secondary | ICD-10-CM | POA: Diagnosis not present

## 2017-08-30 DIAGNOSIS — Z951 Presence of aortocoronary bypass graft: Secondary | ICD-10-CM | POA: Diagnosis not present

## 2017-08-30 DIAGNOSIS — R55 Syncope and collapse: Secondary | ICD-10-CM | POA: Diagnosis not present

## 2017-08-30 DIAGNOSIS — Z7951 Long term (current) use of inhaled steroids: Secondary | ICD-10-CM | POA: Insufficient documentation

## 2017-08-30 DIAGNOSIS — I110 Hypertensive heart disease with heart failure: Secondary | ICD-10-CM | POA: Insufficient documentation

## 2017-08-30 DIAGNOSIS — I252 Old myocardial infarction: Secondary | ICD-10-CM | POA: Insufficient documentation

## 2017-08-30 DIAGNOSIS — E119 Type 2 diabetes mellitus without complications: Secondary | ICD-10-CM | POA: Diagnosis not present

## 2017-08-30 DIAGNOSIS — Z794 Long term (current) use of insulin: Secondary | ICD-10-CM | POA: Diagnosis not present

## 2017-08-30 DIAGNOSIS — I480 Paroxysmal atrial fibrillation: Secondary | ICD-10-CM | POA: Diagnosis not present

## 2017-08-30 HISTORY — PX: LOOP RECORDER INSERTION: EP1214

## 2017-08-30 LAB — GLUCOSE, CAPILLARY: Glucose-Capillary: 147 mg/dL — ABNORMAL HIGH (ref 65–99)

## 2017-08-30 SURGERY — LOOP RECORDER INSERTION

## 2017-08-30 MED ORDER — LIDOCAINE-EPINEPHRINE (PF) 2 %-1:200000 IJ SOLN
INTRAMUSCULAR | Status: DC | PRN
  Administered 2017-08-30: 20 mL

## 2017-08-30 SURGICAL SUPPLY — 2 items
LOOP REVEAL LINQSYS (Prosthesis & Implant Heart) ×2 IMPLANT
PACK LOOP INSERTION (CUSTOM PROCEDURE TRAY) ×2 IMPLANT

## 2017-08-30 NOTE — Interval H&P Note (Signed)
History and Physical Interval Note:  08/30/2017 7:14 AM  Hannah [name]  has presented today for surgery, with the diagnosis of syncope  The various methods of treatment have been discussed with the patient and family. After consideration of risks, benefits and other options for treatment, the patient has consented to  Procedure(s): LOOP RECORDER INSERTION (N/A) as a surgical intervention .  The patient's history has been reviewed, patient examined, no change in status, stable for surgery.  I have reviewed the patient's chart and labs.  Questions were answered to the patient's satisfaction.     [name]

## 2017-08-30 NOTE — Discharge Instructions (Signed)
LOOP RECORDER INSTRUCTION SHEET GIVEN. °

## 2017-09-01 ENCOUNTER — Observation Stay (HOSPITAL_COMMUNITY)
Admission: EM | Admit: 2017-09-01 | Discharge: 2017-09-03 | Disposition: A | Discharge status: Home / Self Care | Payer: Medicare Other | Attending: Internal Medicine | Admitting: Internal Medicine

## 2017-09-01 ENCOUNTER — Encounter (HOSPITAL_COMMUNITY): Payer: Self-pay | Admitting: Emergency Medicine

## 2017-09-01 ENCOUNTER — Emergency Department (HOSPITAL_COMMUNITY): Payer: Medicare Other

## 2017-09-01 DIAGNOSIS — J45909 Unspecified asthma, uncomplicated: Secondary | ICD-10-CM | POA: Diagnosis not present

## 2017-09-01 DIAGNOSIS — Z79899 Other long term (current) drug therapy: Secondary | ICD-10-CM | POA: Diagnosis not present

## 2017-09-01 DIAGNOSIS — N184 Chronic kidney disease, stage 4 (severe): Secondary | ICD-10-CM | POA: Diagnosis present

## 2017-09-01 DIAGNOSIS — I2510 Atherosclerotic heart disease of native coronary artery without angina pectoris: Secondary | ICD-10-CM | POA: Diagnosis present

## 2017-09-01 DIAGNOSIS — N189 Chronic kidney disease, unspecified: Secondary | ICD-10-CM | POA: Insufficient documentation

## 2017-09-01 DIAGNOSIS — Z7982 Long term (current) use of aspirin: Secondary | ICD-10-CM | POA: Diagnosis not present

## 2017-09-01 DIAGNOSIS — E119 Type 2 diabetes mellitus without complications: Secondary | ICD-10-CM

## 2017-09-01 DIAGNOSIS — I471 Supraventricular tachycardia: Secondary | ICD-10-CM | POA: Diagnosis not present

## 2017-09-01 DIAGNOSIS — Z951 Presence of aortocoronary bypass graft: Secondary | ICD-10-CM | POA: Insufficient documentation

## 2017-09-01 DIAGNOSIS — D509 Iron deficiency anemia, unspecified: Secondary | ICD-10-CM | POA: Diagnosis present

## 2017-09-01 DIAGNOSIS — R55 Syncope and collapse: Secondary | ICD-10-CM | POA: Diagnosis present

## 2017-09-01 DIAGNOSIS — I5043 Acute on chronic combined systolic (congestive) and diastolic (congestive) heart failure: Secondary | ICD-10-CM | POA: Insufficient documentation

## 2017-09-01 DIAGNOSIS — D0591 Unspecified type of carcinoma in situ of right breast: Secondary | ICD-10-CM | POA: Insufficient documentation

## 2017-09-01 DIAGNOSIS — I130 Hypertensive heart and chronic kidney disease with heart failure and stage 1 through stage 4 chronic kidney disease, or unspecified chronic kidney disease: Secondary | ICD-10-CM | POA: Insufficient documentation

## 2017-09-01 DIAGNOSIS — N179 Acute kidney failure, unspecified: Secondary | ICD-10-CM | POA: Diagnosis present

## 2017-09-01 DIAGNOSIS — Z794 Long term (current) use of insulin: Secondary | ICD-10-CM | POA: Insufficient documentation

## 2017-09-01 DIAGNOSIS — Z91040 Latex allergy status: Secondary | ICD-10-CM | POA: Insufficient documentation

## 2017-09-01 DIAGNOSIS — I10 Essential (primary) hypertension: Secondary | ICD-10-CM | POA: Diagnosis present

## 2017-09-01 DIAGNOSIS — R251 Tremor, unspecified: Secondary | ICD-10-CM | POA: Diagnosis not present

## 2017-09-01 DIAGNOSIS — Z8679 Personal history of other diseases of the circulatory system: Secondary | ICD-10-CM

## 2017-09-01 DIAGNOSIS — C50211 Malignant neoplasm of upper-inner quadrant of right female breast: Secondary | ICD-10-CM | POA: Diagnosis present

## 2017-09-01 DIAGNOSIS — N183 Chronic kidney disease, stage 3 (moderate): Secondary | ICD-10-CM

## 2017-09-01 DIAGNOSIS — I5032 Chronic diastolic (congestive) heart failure: Secondary | ICD-10-CM | POA: Diagnosis present

## 2017-09-01 DIAGNOSIS — R569 Unspecified convulsions: Principal | ICD-10-CM

## 2017-09-01 DIAGNOSIS — E1122 Type 2 diabetes mellitus with diabetic chronic kidney disease: Secondary | ICD-10-CM | POA: Diagnosis not present

## 2017-09-01 HISTORY — DX: Unspecified convulsions: R56.9

## 2017-09-01 LAB — BASIC METABOLIC PANEL
Anion gap: 14 (ref 5–15)
BUN: 41 mg/dL — ABNORMAL HIGH (ref 6–20)
CO2: 25 mmol/L (ref 22–32)
Calcium: 9.3 mg/dL (ref 8.9–10.3)
Chloride: 101 mmol/L (ref 101–111)
Creatinine, Ser: 2.77 mg/dL — ABNORMAL HIGH (ref 0.44–1.00)
GFR calc Af Amer: 19 mL/min — ABNORMAL LOW (ref >60)
GFR calc non Af Amer: 16 mL/min — ABNORMAL LOW (ref >60)
Glucose, Bld: 118 mg/dL — ABNORMAL HIGH (ref 65–99)
Potassium: 3.6 mmol/L (ref 3.5–5.1)
Sodium: 140 mmol/L (ref 135–145)

## 2017-09-01 LAB — CBC
HCT: 29.5 % — ABNORMAL LOW (ref 36.0–46.0)
Hemoglobin: 9.4 g/dL — ABNORMAL LOW (ref 12.0–15.0)
MCH: 30.4 pg (ref 26.0–34.0)
MCHC: 31.9 g/dL (ref 30.0–36.0)
MCV: 95.5 fL (ref 78.0–100.0)
Platelets: 235 10*3/uL (ref 150–400)
RBC: 3.09 MIL/uL — ABNORMAL LOW (ref 3.87–5.11)
RDW: 13.9 % (ref 11.5–15.5)
WBC: 9.2 10*3/uL (ref 4.0–10.5)

## 2017-09-01 NOTE — ED Provider Notes (Signed)
[facility] EMERGENCY DEPARTMENT Provider Note   CSN: [id number] Arrival date & time: 09/01/17  1953     History   Chief Complaint Chief Complaint  Patient presents with  . Loss of Consciousness  . Tremors    HPI Hannah [name] is a 72 y.o. female.  HPI Patient presents to the emergency department with episodes of right arm and left leg shaking and not able to recall these events.  She has had 6 of these today.  She had similar episode several years back but was never fully worked up neurologically for them.  The patient states that she has been having these episodically over the last 2 weeks but then today it worsened.  The husband and son did get these on video.  The patient states that once they stop she is able to continue her task that she was doing at that time.  Patient states that at times she will feel some tightness in her throat right before it happens.  It does not feel like lightheadedness or a sensation when you stand up too quickly.  She states that she knows what that feels like this is not feels similar.  The patient denies chest pain, shortness of breath, headache,blurred vision, neck pain, fever, cough, weakness, numbness, dizziness, anorexia, edema, abdominal pain, nausea, vomiting, diarrhea, rash, back pain, dysuria, hematemesis, bloody stool, near syncope, or syncope. Past Medical History:  Diagnosis Date  . Abnormally small mouth   . Anemia   . Bilateral carotid artery stenosis    Bilateral ICA 40-59%/  >50% LECA  . Breast cancer ([place]) 01/18/2016   right breast  . Breast cancer of upper-inner quadrant of right female breast ([place]) 12/16/2015  . CAD (coronary artery disease) cardiologist-  dr Aundra Dubin   Remote CABG in 1998 with redo in 2009  . CHF (congestive heart failure) (HCC)    EF is low normal at 50 to 55% per echo in Jan. 2011  . GERD (gastroesophageal reflux disease)   . Hearing loss of right ear   . History of non-ST elevation  myocardial infarction (NSTEMI)    Sept 2014--  thought to be type II HTN w/ LHC without infarct related artery and patent grafts  . History of radiation therapy 05/24/16-07/26/16   right breast 50.4 Gy in 28 fractions, right breast boost 10 Gy in 5 fractions  . Hyperlipidemia   . Hypertension    Resolved.  . Iron deficiency anemia   . Moderate persistent asthma    pulmologist-  Dr. Malvin Johns  . Nonischemic cardiomyopathy ([place])   . PAF (paroxysmal atrial fibrillation) ([place])   . Personal history of chemotherapy 2017  . Personal history of radiation therapy 2017  . Psoriasis    right leg  . Renal calculus, right   . Renal insufficiency, mild   . S/P AVR    prosthesis valve placement 2009 at same time re-do CABG  . 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 Type 2 diabetes mellitus ([place])    monitored by dr Dwyane Dee    Patient Active Problem List   Diagnosis Date Noted  . Syncope due to orthostatic hypotension 10/23/2016  . Adrenal insufficiency ([place]) 07/03/2016  . Seizure ([place]) 06/29/2016  . Influenza A   . Syncope   . Chemotherapy-induced peripheral neuropathy ([place]) 04/27/2016  . 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 Osteomyelitis of toe of left foot ([place]) 04/06/2016  . Mania ([place]) 03/05/2016  . GERD (gastroesophageal reflux disease)  02/29/2016  . Chemotherapy induced nausea and vomiting 02/24/2016  . Port catheter in place 02/17/2016  . Sensorineural hearing loss (SNHL) of both ears 01/06/2016  . Breast cancer of upper-inner quadrant of right female breast ([place]) 12/16/2015  . Osteopenia of multiple sites 10/19/2015  . Chronic kidney disease 09/24/2013  . Chronic diastolic CHF (congestive heart failure) ([place]) 09/24/2013  . Acute on chronic combined systolic and diastolic congestive heart failure ([place]) 07/17/2013  . Unstable angina ([place]) 06/14/2013  . Atrial tachycardia ([place]) 04/03/2013  . Non-ST elevation myocardial infarction (NSTEMI), initial episode of care  ([place]) 02/23/2013  . CAD (coronary artery disease) 01/24/2011  . Chronic coronary artery disease 01/24/2011  . History of aortic valve replacement 10/27/2010  . Hypertension   . Hyperlipidemia   . SOB (shortness of breath)   . Renal insufficiency, mild   . Nonischemic cardiomyopathy ([place])   . Anemia   . Type 2 diabetes mellitus ([place])   . Iron deficiency anemia   . Allergic rhinitis 10/14/2009  . Essential hypertension 05/12/2009  . Atrial fibrillation ([place]) 05/12/2009  . Asthma 05/12/2009    Past Surgical History:  Procedure Laterality Date  . AORTIC VALVE REPLACEMENT  2009   [facility]done same time is CABG)  . BREAST LUMPECTOMY Right 01/18/2016  . BREAST LUMPECTOMY WITH RADIOACTIVE SEED AND SENTINEL LYMPH NODE BIOPSY Right 01/18/2016   Procedure: RIGHT BREAST LUMPECTOMY WITH RADIOACTIVE SEED AND SENTINEL LYMPH NODE BIOPSY;  Surgeon: DAlphonsa Overall MD;  [facility]  Service: General;  Laterality: Right;  . CARDIAC CATHETERIZATION  03/23/2008   Pre-redo CABG: L main OK, LAD (T), CFX (T), OM1 99%, RCA (T), LIMA-LAD OK, SVG-OM(?3) OK w/ little florw to OM2, SVG-RCA OK. EF NL  . CARPAL TUNNEL RELEASE    . COLONOSCOPY    . CORONARY ARTERY BYPASS GRAFT  1998 &  re-do 2009   Had LIMA to DX/LAD, SVG to 2 marginal branches and [facility]; SVG to 3rd OM and PD at time of redo  . CYSTOSCOPY W/ URETERAL STENT PLACEMENT Right 12/20/2014   Procedure: CYSTOSCOPY WITH RETROGRADE PYELOGRAM/URETERAL STENT PLACEMENT;  Surgeon: PCleon Gustin MD;  [facility]  Service: Urology;  Laterality: Right;  . EYE SURGERY Bilateral    cataracts  . HOLMIUM LASER APPLICATION Right 79/07/4095  Procedure:  HOLMIUM LASER LITHOTRIPSY;  Surgeon: PCleon Gustin MD;  [facility]  Service: Urology;  Laterality: Right;  . LEFT HEART CATHETERIZATION WITH CORONARY/GRAFT ANGIOGRAM N/A 02/23/2013   Procedure: LEFT  HEART CATHETERIZATION WITH CBeatrix Fetters  Surgeon: MBlane Ohara MD;  [facility];  Service: Cardiovascular;  Laterality: N/A;  . LOOP RECORDER INSERTION N/A 08/30/2017   Procedure: LOOP RECORDER INSERTION;  Surgeon: TEvans Lance MD;  [facility];  Service: Cardiovascular;  Laterality: N/A;  . PORTACATH PLACEMENT Left 01/18/2016   Procedure: INSERTION PORT-A-CATH;  Surgeon: DAlphonsa Overall MD;  [facility]  Service: General;  Laterality: Left;  . portacath removal    . TONSILLECTOMY    . TRANSTHORACIC ECHOCARDIOGRAM  02-24-2013      mild LVH,  ef 50-55%/  AV bioprosthesis was present with very mild stenosis and no regurg., mean grandient 124mg, peak grandient 2059m /  mild MR/  mild LAE and RAE/  moderate TR  . TUBAL LIGATION       OB History   None  Home Medications    Prior to Admission medications   Medication Sig Start Date End Date Taking? Authorizing Provider  ACCU-CHEK GUIDE test strip Use to test blood sugar 3 times daily 06/07/17   Elayne Snare, MD  albuterol (PROVENTIL HFA;VENTOLIN HFA) 108 (90 Base) MCG/ACT inhaler Inhale 2 puffs into the lungs every 6 (six) hours as needed for wheezing or shortness of breath.    [provider]  amiodarone (PACERONE) 200 MG tablet Take 1 tablet (200 mg total) by mouth daily. 06/24/17   Evans Lance, MD  aspirin EC 81 MG EC tablet Take 1 tablet (81 mg total) by mouth daily. 06/16/13   Lyda Jester M, PA-C  clopidogrel (PLAVIX) 75 MG tablet TAKE 1 TABLET BY MOUTH  DAILY Patient taking differently: TAKE 75 MG BY MOUTH  DAILY 08/05/17   [name], NP  Dulaglutide (TRULICITY) 1.5 GN/5.6OZ SOPN Inject contents of pen once a week Patient taking differently: Inject 1.5 mg into the skin every Wednesday.  08/05/17   Elayne Snare, MD  exemestane (AROMASIN) 25 MG tablet TAKE ONE TABLET BY MOUTH DAILY AFTER BREAKFAST Patient taking differently: TAKE 25 MG BY MOUTH DAILY AFTER  BREAKFAST 06/19/17   Nicholas Lose, MD  ezetimibe (ZETIA) 10 MG tablet Take 1 tablet (10 mg total) by mouth daily. 06/13/17   Elayne Snare, MD  furosemide (LASIX) 20 MG tablet Take 20 mg by mouth daily as needed for fluid.     [provider]  gabapentin (NEURONTIN) 300 MG capsule TAKE 1 CAPSULE BY MOUTH 3  TIMES DAILY Patient taking differently: TAKE 300 MG BY MOUTH 3 TIMES DAILY 08/26/17   Nicholas Lose, MD  Insulin Disposable Pump (V-GO 30) KIT 1 Device by Does not apply route daily. 08/05/17   Elayne Snare, MD  insulin lispro (HUMALOG) 100 UNIT/ML injection Inject 76 units daily with  V-GO pump 12/11/16   Elayne Snare, MD  levocetirizine (XYZAL) 5 MG tablet Take 5 mg by mouth daily.    [provider]  prochlorperazine (COMPAZINE) 10 MG tablet TAKE 1 TABLET BY MOUTH  EVERY 6 HOURS AS NEEDED FOR NAUSEA OR VOMITING Patient taking differently: TAKE 10 MG BY MOUTH  EVERY 6 HOURS AS NEEDED FOR NAUSEA OR VOMITING 05/08/17   Nicholas Lose, MD  rosuvastatin (CRESTOR) 40 MG tablet TAKE ONE TABLET BY MOUTH DAILY 08/28/17   [name], NP  SYMBICORT 160-4.5 MCG/ACT inhaler INHALE TWO PUFFS BY MOUTH TWICE DAILY 02/20/17   Collene Gobble, MD  traMADol (ULTRAM) 50 MG tablet 1 three times a day as needed. Patient taking differently: Take 50 mg by mouth 2 (two) times daily as needed for moderate pain.  08/19/17   Garald Balding, MD    Family History Family History  Problem Relation Age of Onset  . Heart disease Father   . Heart failure Father   . Diabetes Maternal Grandmother   . Heart disease Maternal Grandmother   . Diabetes Son   . Healthy Brother        #1  . Heart attack Brother        #2  . Heart disease Brother        #2    Social History Social History   Tobacco Use  . Smoking status: Never Smoker  . Smokeless tobacco: Never Used  Substance Use Topics  . Alcohol use: No  . Drug use: No     Allergies   Amoxicillin; Imdur [isosorbide dinitrate]; Tape; Arimidex  [  anastrozole]; Latex; and Tetracycline   Review of Systems Review of Systems  All other systems negative except as documented in the HPI. All pertinent positives and negatives as reviewed in the HPI. Physical Exam Updated Vital Signs BP 137/71 (BP Location: Right Arm)   Pulse 85   Temp 99.2 F (37.3 C) (Oral)   Resp (!) 21   SpO2 100%   Physical Exam  Constitutional: She is oriented to person, place, and time. She appears well-developed and well-nourished. No distress.  HENT:  Head: Normocephalic and atraumatic.  Mouth/Throat: Oropharynx is clear and moist.  Eyes: Pupils are equal, round, and reactive to light.  Neck: Normal range of motion. Neck supple.  Cardiovascular: Normal rate, regular rhythm and normal heart sounds. Exam reveals no gallop and no friction rub.  No murmur heard. Pulmonary/Chest: Effort normal and breath sounds normal. No respiratory distress. She has no wheezes.  Abdominal: Soft. Bowel sounds are normal. She exhibits no distension. There is no tenderness.  Neurological: She is alert and oriented to person, place, and time. She has normal strength and normal reflexes. She displays normal reflexes. No cranial nerve deficit or sensory deficit. She exhibits normal muscle tone. Coordination normal. GCS eye subscore is 4. GCS verbal subscore is 5. GCS motor subscore is 6.  Skin: Skin is warm and dry. Capillary refill takes less than 2 seconds. No rash noted. No erythema.  Psychiatric: She has a normal mood and affect. Her behavior is normal.  Nursing note and vitals reviewed.    ED Treatments / Results  Labs (all labs ordered are listed, but only abnormal results are displayed) Labs Reviewed  BASIC METABOLIC PANEL - Abnormal; Notable for the following components:      Result Value   Glucose, Bld 118 (*)    BUN 41 (*)    Creatinine, Ser 2.77 (*)    GFR calc non Af Amer 16 (*)    GFR calc Af Amer 19 (*)    All other components within normal limits  CBC -  Abnormal; Notable for the following components:   RBC 3.09 (*)    Hemoglobin 9.4 (*)    HCT 29.5 (*)    All other components within normal limits  URINALYSIS, ROUTINE W REFLEX MICROSCOPIC  CBG MONITORING, ED    EKG EKG Interpretation  Date/Time:  Sunday September 01 2017 20:00:43 EDT Ventricular Rate:  116 PR Interval:    QRS Duration: 84 QT Interval:  290 QTC Calculation: 403 R Axis:   -19 Text Interpretation:  Sinus tachycardia Nonspecific T wave abnormality Abnormal ECG since last tracing no significant change Confirmed by Malvin Johns [phone]) on 09/01/2017 10:44:38 PM   Radiology Ct Head Wo Contrast  Result Date: 09/01/2017 CLINICAL DATA:  Seizure. History of breast cancer and radiation therapy. EXAM: CT HEAD WITHOUT CONTRAST TECHNIQUE: Contiguous axial images were obtained from the base of the skull through the vertex without intravenous contrast. COMPARISON:  Brain MRI 06/29/2016 FINDINGS: Brain: No mass lesion, intraparenchymal hemorrhage or extra-axial collection. No evidence of acute cortical infarct. Normal appearance of the brain parenchyma and extra axial spaces for age. Vascular: No hyperdense vessel or unexpected vascular calcification. Skull: Normal visualized skull base, calvarium and extracranial soft tissues. Sinuses/Orbits: No sinus fluid levels or advanced mucosal thickening. No mastoid effusion. Normal orbits. IMPRESSION: Normal brain. Electronically Signed   By: Ulyses Jarred M.D.   On: 09/01/2017 22:17    Procedures Procedures (including critical care time)  Medications Ordered in ED Medications -  No data to display   Initial Impression / Assessment and Plan / ED Course  I have reviewed the triage vital signs and the nursing notes.  Pertinent labs & imaging results that were available during my care of the patient were reviewed by me and considered in my medical decision making (see chart for details).     I spoke with Dr. Leonel Ramsay of neurology who is  evaluated the patient.  He feels she needs to be admitted and have a further workup of this possible partial seizures/orthostasis/psychogenic episodes.  He asked that the [facility] admit.  He feels that she needs a overnight EEG and other workup for this.  Final Clinical Impressions(s) / ED Diagnoses   Final diagnoses:  None    ED Discharge Orders    None       Dalia Heading, PA-C 09/01/17 2349    Malvin Johns, MD 09/01/17 2356

## 2017-09-01 NOTE — ED Notes (Signed)
Pt was unable to provide urine sample at the moment has not had anything to drink throughout the day. Pt stated she would try and provide sample later.

## 2017-09-01 NOTE — ED Triage Notes (Addendum)
Pt reports spells where she "stares out off and starts to shake."  Family report they have caught her every times since she fell and broke her arm two weeks ago.  Pt did have a loop recorder placed on Friday.  Denies remembering the episode but denies N/V/D, diaphoresis.  Pt is alert and oriented in triage.  Negative NIH

## 2017-09-02 ENCOUNTER — Ambulatory Visit (INDEPENDENT_AMBULATORY_CARE_PROVIDER_SITE_OTHER): Payer: Medicare Other | Admitting: Orthopaedic Surgery

## 2017-09-02 ENCOUNTER — Observation Stay (HOSPITAL_COMMUNITY): Payer: Medicare Other

## 2017-09-02 ENCOUNTER — Encounter: Payer: Self-pay | Admitting: Neurology

## 2017-09-02 ENCOUNTER — Other Ambulatory Visit: Payer: Self-pay

## 2017-09-02 DIAGNOSIS — E1122 Type 2 diabetes mellitus with diabetic chronic kidney disease: Secondary | ICD-10-CM | POA: Diagnosis not present

## 2017-09-02 DIAGNOSIS — I2510 Atherosclerotic heart disease of native coronary artery without angina pectoris: Secondary | ICD-10-CM | POA: Diagnosis not present

## 2017-09-02 DIAGNOSIS — N179 Acute kidney failure, unspecified: Secondary | ICD-10-CM

## 2017-09-02 DIAGNOSIS — N183 Chronic kidney disease, stage 3 (moderate): Secondary | ICD-10-CM | POA: Diagnosis not present

## 2017-09-02 DIAGNOSIS — Z170 Estrogen receptor positive status [ER+]: Secondary | ICD-10-CM | POA: Diagnosis not present

## 2017-09-02 DIAGNOSIS — N184 Chronic kidney disease, stage 4 (severe): Secondary | ICD-10-CM

## 2017-09-02 DIAGNOSIS — Z794 Long term (current) use of insulin: Secondary | ICD-10-CM | POA: Diagnosis not present

## 2017-09-02 DIAGNOSIS — R569 Unspecified convulsions: Secondary | ICD-10-CM | POA: Diagnosis not present

## 2017-09-02 DIAGNOSIS — C50211 Malignant neoplasm of upper-inner quadrant of right female breast: Secondary | ICD-10-CM | POA: Diagnosis not present

## 2017-09-02 DIAGNOSIS — I5032 Chronic diastolic (congestive) heart failure: Secondary | ICD-10-CM | POA: Diagnosis not present

## 2017-09-02 LAB — GLUCOSE, CAPILLARY
Glucose-Capillary: 102 mg/dL — ABNORMAL HIGH (ref 65–99)
Glucose-Capillary: 130 mg/dL — ABNORMAL HIGH (ref 65–99)
Glucose-Capillary: 314 mg/dL — ABNORMAL HIGH (ref 65–99)
Glucose-Capillary: 275 mg/dL — ABNORMAL HIGH (ref 65–99)
Glucose-Capillary: 227 mg/dL — ABNORMAL HIGH (ref 65–99)

## 2017-09-02 LAB — CBC
HCT: 28 % — ABNORMAL LOW (ref 36.0–46.0)
Hemoglobin: 8.8 g/dL — ABNORMAL LOW (ref 12.0–15.0)
MCH: 29.7 pg (ref 26.0–34.0)
MCHC: 31.4 g/dL (ref 30.0–36.0)
MCV: 94.6 fL (ref 78.0–100.0)
Platelets: 200 10*3/uL (ref 150–400)
RBC: 2.96 MIL/uL — ABNORMAL LOW (ref 3.87–5.11)
RDW: 13.7 % (ref 11.5–15.5)
WBC: 10.7 10*3/uL — ABNORMAL HIGH (ref 4.0–10.5)

## 2017-09-02 LAB — BASIC METABOLIC PANEL
Anion gap: 15 (ref 5–15)
BUN: 42 mg/dL — ABNORMAL HIGH (ref 6–20)
CO2: 24 mmol/L (ref 22–32)
Calcium: 8.8 mg/dL — ABNORMAL LOW (ref 8.9–10.3)
Chloride: 101 mmol/L (ref 101–111)
Creatinine, Ser: 2.86 mg/dL — ABNORMAL HIGH (ref 0.44–1.00)
GFR calc Af Amer: 18 mL/min — ABNORMAL LOW (ref >60)
GFR calc non Af Amer: 16 mL/min — ABNORMAL LOW (ref >60)
Glucose, Bld: 142 mg/dL — ABNORMAL HIGH (ref 65–99)
Potassium: 3.5 mmol/L (ref 3.5–5.1)
Sodium: 140 mmol/L (ref 135–145)

## 2017-09-02 LAB — URINALYSIS, ROUTINE W REFLEX MICROSCOPIC
Bilirubin Urine: NEGATIVE
Glucose, UA: NEGATIVE mg/dL
Ketones, ur: NEGATIVE mg/dL
Nitrite: NEGATIVE
Protein, ur: 30 mg/dL — AB
Specific Gravity, Urine: 1.008 (ref 1.005–1.030)
pH: 6 (ref 5.0–8.0)

## 2017-09-02 MED ORDER — INSULIN GLARGINE 100 UNIT/ML ~~LOC~~ SOLN
25.0000 [IU] | Freq: Every day | SUBCUTANEOUS | Status: DC
Start: 2017-09-02 — End: 2017-09-03
  Administered 2017-09-02 – 2017-09-03 (×2): 25 [IU] via SUBCUTANEOUS
  Filled 2017-09-02 (×2): qty 0.25

## 2017-09-02 MED ORDER — ROSUVASTATIN CALCIUM 20 MG PO TABS
40.0000 mg | ORAL_TABLET | Freq: Every day | ORAL | Status: DC
  Administered 2017-09-02: 40 mg via ORAL
  Filled 2017-09-02: qty 2

## 2017-09-02 MED ORDER — ALBUTEROL SULFATE (2.5 MG/3ML) 0.083% IN NEBU: 3.0000 mL | INHALATION_SOLUTION | Freq: Four times a day (QID) | RESPIRATORY_TRACT | Status: DC | PRN

## 2017-09-02 MED ORDER — GABAPENTIN 300 MG PO CAPS
300.0000 mg | ORAL_CAPSULE | Freq: Every day | ORAL | Status: DC
  Administered 2017-09-02 – 2017-09-03 (×2): 300 mg via ORAL
  Filled 2017-09-02 (×2): qty 1

## 2017-09-02 MED ORDER — INSULIN ASPART 100 UNIT/ML ~~LOC~~ SOLN
0.0000 [IU] | Freq: Every day | SUBCUTANEOUS | Status: DC
  Administered 2017-09-02: 2 [IU] via SUBCUTANEOUS

## 2017-09-02 MED ORDER — TRAMADOL HCL 50 MG PO TABS: 50.0000 mg | ORAL_TABLET | Freq: Four times a day (QID) | ORAL | Status: DC | PRN

## 2017-09-02 MED ORDER — SENNOSIDES-DOCUSATE SODIUM 8.6-50 MG PO TABS: 1.0000 | ORAL_TABLET | Freq: Every evening | ORAL | Status: DC | PRN

## 2017-09-02 MED ORDER — ONDANSETRON HCL 4 MG PO TABS: 4.0000 mg | ORAL_TABLET | Freq: Four times a day (QID) | ORAL | Status: DC | PRN

## 2017-09-02 MED ORDER — SODIUM CHLORIDE 0.9 % IV SOLN
INTRAVENOUS | Status: DC
  Administered 2017-09-02: 02:00:00 via INTRAVENOUS

## 2017-09-02 MED ORDER — HEPARIN SODIUM (PORCINE) 5000 UNIT/ML IJ SOLN
5000.0000 [IU] | Freq: Three times a day (TID) | INTRAMUSCULAR | Status: DC
  Filled 2017-09-02 (×2): qty 1

## 2017-09-02 MED ORDER — EXEMESTANE 25 MG PO TABS
25.0000 mg | ORAL_TABLET | Freq: Every day | ORAL | Status: DC
  Administered 2017-09-02 – 2017-09-03 (×2): 25 mg via ORAL
  Filled 2017-09-02 (×2): qty 1

## 2017-09-02 MED ORDER — SODIUM CHLORIDE 0.9% FLUSH
3.0000 mL | Freq: Two times a day (BID) | INTRAVENOUS | Status: DC
  Administered 2017-09-02 – 2017-09-03 (×3): 3 mL via INTRAVENOUS

## 2017-09-02 MED ORDER — ASPIRIN EC 81 MG PO TBEC
81.0000 mg | DELAYED_RELEASE_TABLET | Freq: Every day | ORAL | Status: DC
  Administered 2017-09-02 – 2017-09-03 (×2): 81 mg via ORAL
  Filled 2017-09-02 (×2): qty 1

## 2017-09-02 MED ORDER — ACETAMINOPHEN 325 MG PO TABS
650.0000 mg | ORAL_TABLET | Freq: Four times a day (QID) | ORAL | Status: DC | PRN
  Administered 2017-09-02 – 2017-09-03 (×2): 650 mg via ORAL
  Filled 2017-09-02 (×2): qty 2

## 2017-09-02 MED ORDER — AMIODARONE HCL 200 MG PO TABS
200.0000 mg | ORAL_TABLET | Freq: Every day | ORAL | Status: DC
  Administered 2017-09-02 – 2017-09-03 (×2): 200 mg via ORAL
  Filled 2017-09-02 (×2): qty 1

## 2017-09-02 MED ORDER — INSULIN ASPART 100 UNIT/ML ~~LOC~~ SOLN
0.0000 [IU] | Freq: Three times a day (TID) | SUBCUTANEOUS | Status: DC
  Administered 2017-09-02: 7 [IU] via SUBCUTANEOUS
  Administered 2017-09-02: 1 [IU] via SUBCUTANEOUS
  Administered 2017-09-02: 5 [IU] via SUBCUTANEOUS
  Administered 2017-09-03 (×2): 2 [IU] via SUBCUTANEOUS

## 2017-09-02 MED ORDER — MOMETASONE FURO-FORMOTEROL FUM 200-5 MCG/ACT IN AERO
2.0000 | INHALATION_SPRAY | Freq: Two times a day (BID) | RESPIRATORY_TRACT | Status: DC
  Administered 2017-09-02 – 2017-09-03 (×3): 2 via RESPIRATORY_TRACT
  Filled 2017-09-02: qty 8.8

## 2017-09-02 MED ORDER — CLOPIDOGREL BISULFATE 75 MG PO TABS
75.0000 mg | ORAL_TABLET | Freq: Every day | ORAL | Status: DC
  Administered 2017-09-02 – 2017-09-03 (×2): 75 mg via ORAL
  Filled 2017-09-02 (×2): qty 1

## 2017-09-02 MED ORDER — LORATADINE 10 MG PO TABS
10.0000 mg | ORAL_TABLET | Freq: Every day | ORAL | Status: DC
  Administered 2017-09-02 – 2017-09-03 (×2): 10 mg via ORAL
  Filled 2017-09-02 (×2): qty 1

## 2017-09-02 MED ORDER — ACETAMINOPHEN 650 MG RE SUPP: 650.0000 mg | Freq: Four times a day (QID) | RECTAL | Status: DC | PRN

## 2017-09-02 MED ORDER — EZETIMIBE 10 MG PO TABS
10.0000 mg | ORAL_TABLET | Freq: Every day | ORAL | Status: DC
  Administered 2017-09-02 – 2017-09-03 (×2): 10 mg via ORAL
  Filled 2017-09-02 (×2): qty 1

## 2017-09-02 MED ORDER — ONDANSETRON HCL 4 MG/2ML IJ SOLN: 4.0000 mg | Freq: Four times a day (QID) | INTRAMUSCULAR | Status: DC | PRN

## 2017-09-02 MED ORDER — INSULIN ASPART 100 UNIT/ML ~~LOC~~ SOLN
5.0000 [IU] | Freq: Three times a day (TID) | SUBCUTANEOUS | Status: DC
  Administered 2017-09-02 – 2017-09-03 (×3): 5 [IU] via SUBCUTANEOUS

## 2017-09-02 NOTE — Progress Notes (Addendum)
Inpatient Diabetes Program Recommendations  AACE/ADA: New Consensus Statement on Inpatient Glycemic Control (2015)  Target Ranges:  Prepandial:   less than 140 mg/dL      Peak postprandial:   less than 180 mg/dL (1-2 hours)      Critically ill patients:  140 - 180 mg/dL   Lab Results  Component Value Date   GLUCAP 314 (H) 09/02/2017   HGBA1C 7.7 (H) 05/16/2017    Review of Glycemic ControlResults for NAYELIS, BONITO (MRN [id number]) as of 09/02/2017 14:12  Ref. Range 09/02/2017 01:54 09/02/2017 07:25 09/02/2017 12:53  Glucose-Capillary Latest Ref Range: 65 - 99 mg/dL 102 (H) 130 (H) 314 (H)    Diabetes history: Type 2 DM Outpatient Diabetes medications: V-Go 30 (which administers 30 units of basal insulin) with Humalog, Humalog 8 units with breakfast, 8 units with lunch, and 10 units with supper  Trulicity 1.5 mg weekly Current orders for Inpatient glycemic control:  Novolog sensitive tid with meals and HS  Inpatient Diabetes Program Recommendations:   Call received from RN.  She states that patient is not currently wearing V-GO. Patient does not have supplies at the hospital.  Recommend adding Lantus 25 units daily and Novolog 5 units tid with meals (for meal coverage).  Text page sent to MD.  Will follow.   Thanks,  Adah Perl, RN, BC-ADM Inpatient Diabetes Coordinator Pager [phone] (8a-5p)

## 2017-09-02 NOTE — H&P (Signed)
History and Physical    Hannah [name] BJS:[id number] DOB: April 26, 1946 DOA: 09/01/2017  PCP: Mackie Pai, PA-C   Patient coming from: Home  Chief Complaint: Shaking and staring episodes   HPI: Hannah [name] is a 72 y.o. female with medical history significant for coronary artery disease, insulin-dependent diabetes mellitus, history of right breast cancer, and iron deficiency anemia, now presenting to the emergency department for evaluation of staring and shaking episodes.  Patient has had recurrent episodes going back more than 1 year marked by transient loss of awareness with shaking of her extremities.  She had been evaluated more than a year ago with unremarkable MRI and EEG, and also evaluated for syncope.  She recently saw cardiology for evaluation of this and had a loop recorder implanted.  She has also seen neurology in the outpatient setting with 48-hour EEG scheduled for later this week.  The episodes have become much more frequent and she now comes into the ED after several episodes today.  She denies headache, change in vision or hearing, or focal numbness or weakness.  Denies fevers or chills.  Denies chest pain or palpitations.  ED Course: Upon arrival to the ED, patient is found to be afebrile, saturating well on room air, tachycardic in the 110s, and vitals otherwise normal.  EKG features a sinus tachycardia with rate 116 and nonspecific T wave abnormalities.  Noncontrast head CT is a normal study.  Chemistry panel reveals a creatinine of 2.77, up from 2.00 last month.  CBC is notable for a hemoglobin of 9.4, down from 10.2 in December 2018.  Neurology was consulted by the ED physician and recommended medical admission for further evaluation of these episodes.  Patient remains hemodynamically stable, in no apparent respiratory distress, and will be observed on the telemetry unit for ongoing evaluation and management of seizure-like activity.  Review of Systems:  All other systems  reviewed and apart from HPI, are negative.  Past Medical History:  Diagnosis Date  . Abnormally small mouth   . Anemia   . Bilateral carotid artery stenosis    Bilateral ICA 40-59%/  >50% LECA  . Breast cancer ([place]) 01/18/2016   right breast  . Breast cancer of upper-inner quadrant of right female breast ([place]) 12/16/2015  . CAD (coronary artery disease) cardiologist-  dr Aundra Dubin   Remote CABG in 1998 with redo in 2009  . CHF (congestive heart failure) (HCC)    EF is low normal at 50 to 55% per echo in Jan. 2011  . GERD (gastroesophageal reflux disease)   . Hearing loss of right ear   . History of non-ST elevation myocardial infarction (NSTEMI)    Sept 2014--  thought to be type II HTN w/ LHC without infarct related artery and patent grafts  . History of radiation therapy 05/24/16-07/26/16   right breast 50.4 Gy in 28 fractions, right breast boost 10 Gy in 5 fractions  . Hyperlipidemia   . Hypertension    Resolved.  . Iron deficiency anemia   . Moderate persistent asthma    pulmologist-  Dr. Malvin Johns  . Nonischemic cardiomyopathy ([employer])   . PAF (paroxysmal atrial fibrillation) ([place])   . Personal history of chemotherapy 2017  . Personal history of radiation therapy 2017  . Psoriasis    right leg  . Renal calculus, right   . Renal insufficiency, mild   . S/P AVR    prosthesis valve placement 2009 at same time re-do CABG  . 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 Type 2 diabetes mellitus ([place])    monitored by dr Dwyane Dee    Past Surgical History:  Procedure Laterality Date  . AORTIC VALVE REPLACEMENT  2009   [facility] (done same time is CABG)  . BREAST LUMPECTOMY Right 01/18/2016  . BREAST LUMPECTOMY WITH RADIOACTIVE SEED AND SENTINEL LYMPH NODE BIOPSY Right 01/18/2016   Procedure: RIGHT BREAST LUMPECTOMY WITH RADIOACTIVE SEED AND SENTINEL LYMPH NODE BIOPSY;  Surgeon: DAlphonsa Overall MD;  [place] [facility]  Service: General;  Laterality: Right;  .  CARDIAC CATHETERIZATION  03/23/2008   Pre-redo CABG: L main OK, LAD (T), CFX (T), OM1 99%, RCA (T), LIMA-LAD OK, SVG-OM(?3) OK w/ little florw to OM2, SVG-RCA OK. EF NL  . CARPAL TUNNEL RELEASE    . COLONOSCOPY    . CORONARY ARTERY BYPASS GRAFT  1998 &  re-do 2009   Had LIMA to DX/LAD, SVG to 2 marginal branches and [facility]; SVG to 3rd OM and PD at time of redo  . CYSTOSCOPY W/ URETERAL STENT PLACEMENT Right 12/20/2014   Procedure: CYSTOSCOPY WITH RETROGRADE PYELOGRAM/URETERAL STENT PLACEMENT;  Surgeon: PCleon Gustin MD;  [facility]  Service: Urology;  Laterality: Right;  . EYE SURGERY Bilateral    cataracts  . HOLMIUM LASER APPLICATION Right 73/15/4008  Procedure:  HOLMIUM LASER LITHOTRIPSY;  Surgeon: PCleon Gustin MD;  [facility]  Service: Urology;  Laterality: Right;  . LEFT HEART CATHETERIZATION WITH CORONARY/GRAFT ANGIOGRAM N/A 02/23/2013   Procedure: LEFT HEART CATHETERIZATION WITH CBeatrix Fetters  Surgeon: MBlane Ohara MD;  [facility];  Service: Cardiovascular;  Laterality: N/A;  . LOOP RECORDER INSERTION N/A 08/30/2017   Procedure: LOOP RECORDER INSERTION;  Surgeon: TEvans Lance MD;  [facility];  Service: Cardiovascular;  Laterality: N/A;  . PORTACATH PLACEMENT Left 01/18/2016   Procedure: INSERTION PORT-A-CATH;  Surgeon: DAlphonsa Overall MD;  [facility]  Service: General;  Laterality: Left;  . portacath removal    . TONSILLECTOMY    . TRANSTHORACIC ECHOCARDIOGRAM  02-24-2013      mild LVH,  ef 50-55%/  AV bioprosthesis was present with very mild stenosis and no regurg., mean grandient 146mg, peak grandient 2078m /  mild MR/  mild LAE and RAE/  moderate TR  . TUBAL LIGATION       reports that she has never smoked. She has never used smokeless tobacco. She reports that she does not drink alcohol or use drugs.  Allergies  Allergen Reactions  . Amoxicillin  Rash and Other (See Comments)    Has patient had a PCN reaction causing immediate rash, facial/tongue/throat swelling, SOB or lightheadedness with hypotension: No Has patient had a PCN reaction causing severe rash involving mucus membranes or skin necrosis: Yes Has patient had a PCN reaction that required hospitalization No Has patient had a PCN reaction occurring within the last 10 years: No If all of the above answers are "NO", then may proceed with Cephalosporin use.   . Imdur [Isosorbide Dinitrate] Other (See Comments)    Headache/severe hypotension/Syncope  . Tape     Must use paper tape  . Arimidex [Anastrozole] Nausea Only  . Latex Itching  . Tetracycline Rash    Family History  Problem Relation Age of Onset  . Heart disease Father   . Heart failure Father   . Diabetes Maternal Grandmother   . Heart  disease Maternal Grandmother   . Diabetes Son   . Healthy Brother        #1  . Heart attack Brother        #2  . Heart disease Brother        #2     Prior to Admission medications   Medication Sig Start Date End Date Taking? Authorizing Provider  albuterol (PROVENTIL HFA;VENTOLIN HFA) 108 (90 Base) MCG/ACT inhaler Inhale 2 puffs into the lungs every 6 (six) hours as needed for wheezing or shortness of breath.   Yes [provider]  amiodarone (PACERONE) 200 MG tablet Take 1 tablet (200 mg total) by mouth daily. 06/24/17  Yes Evans Lance, MD  aspirin EC 81 MG EC tablet Take 1 tablet (81 mg total) by mouth daily. 06/16/13  Yes Lyda Jester M, PA-C  clopidogrel (PLAVIX) 75 MG tablet TAKE 1 TABLET BY MOUTH  DAILY Patient taking differently: TAKE 75 MG BY MOUTH  DAILY 08/05/17  Yes [name], NP  Dulaglutide (TRULICITY) 1.5 ZY/6.0YT SOPN Inject contents of pen once a week Patient taking differently: Inject 1.5 mg into the skin every Wednesday.  08/05/17  Yes Elayne Snare, MD  exemestane (AROMASIN) 25 MG tablet TAKE ONE TABLET BY MOUTH DAILY AFTER  BREAKFAST Patient taking differently: TAKE 25 MG BY MOUTH DAILY AFTER BREAKFAST 06/19/17  Yes Nicholas Lose, MD  ezetimibe (ZETIA) 10 MG tablet Take 1 tablet (10 mg total) by mouth daily. 06/13/17  Yes Elayne Snare, MD  furosemide (LASIX) 20 MG tablet Take 20 mg by mouth daily as needed for fluid.    Yes [provider]  gabapentin (NEURONTIN) 300 MG capsule TAKE 1 CAPSULE BY MOUTH 3  TIMES DAILY Patient taking differently: TAKE 300 MG BY MOUTH 3 TIMES DAILY 08/26/17  Yes Nicholas Lose, MD  Insulin Disposable Pump (V-GO 30) KIT 1 Device by Does not apply route daily. 08/05/17  Yes Elayne Snare, MD  insulin lispro (HUMALOG) 100 UNIT/ML injection Inject 76 units daily with  V-GO pump 12/11/16  Yes Elayne Snare, MD  levocetirizine (XYZAL) 5 MG tablet Take 5 mg by mouth daily.   Yes [provider]  prochlorperazine (COMPAZINE) 10 MG tablet TAKE 1 TABLET BY MOUTH  EVERY 6 HOURS AS NEEDED FOR NAUSEA OR VOMITING Patient taking differently: TAKE 10 MG BY MOUTH  EVERY 6 HOURS AS NEEDED FOR NAUSEA OR VOMITING 05/08/17  Yes Nicholas Lose, MD  rosuvastatin (CRESTOR) 40 MG tablet TAKE ONE TABLET BY MOUTH DAILY 08/28/17  Yes [name], NP  SYMBICORT 160-4.5 MCG/ACT inhaler INHALE TWO PUFFS BY MOUTH TWICE DAILY 02/20/17  Yes Collene Gobble, MD  traMADol (ULTRAM) 50 MG tablet 1 three times a day as needed. Patient taking differently: Take 50 mg by mouth 2 (two) times daily as needed for moderate pain.  08/19/17  Yes Garald Balding, MD  ACCU-CHEK GUIDE test strip Use to test blood sugar 3 times daily 06/07/17   Elayne Snare, MD    Physical Exam: Vitals:   09/01/17 2215 09/01/17 2315 09/01/17 2341 09/01/17 2345  BP:   (!) 131/57   Pulse: 95 85 81 81  Resp: 20 (!) 21 20 (!) 23  Temp:      TempSrc:      SpO2: 98% 100% 99% 100%      Constitutional: NAD, calm  Eyes: PERTLA, lids and conjunctivae normal ENMT: Mucous membranes are dry. Posterior pharynx clear of any exudate or lesions.  Neck: normal, supple, no masses, no thyromegaly Respiratory: clear to auscultation bilaterally, no wheezing, no crackles. Normal respiratory effort.   Cardiovascular: S1 & S2 heard, regular rate and rhythm. No extremity edema. No significant JVD. Abdomen: No distension, no tenderness, no masses palpated. Bowel sounds normal.  Musculoskeletal: left arm immobilized in sling. No joint deformity upper and lower extremities.   Skin: no significant rashes, lesions, ulcers. Warm, dry, well-perfused. Neurologic: CN 2-12 grossly intact. Sensation intact. Strength 5/5 in all 4 limbs.  Psychiatric: Alert and oriented x 3. Calm, cooperative.     Labs on Admission: I have personally reviewed following labs and imaging studies  CBC: Recent Labs  Lab 09/01/17 2005  WBC 9.2  HGB 9.4*  HCT 29.5*  MCV 95.5  PLT 751   Basic Metabolic Panel: Recent Labs  Lab 09/01/17 2005  NA 140  K 3.6  CL 101  CO2 25  GLUCOSE 118*  BUN 41*  CREATININE 2.77*  CALCIUM 9.3   GFR: Estimated Creatinine Clearance: 20.4 mL/min (A) (by C-G formula based on SCr of 2.77 mg/dL (H)). Liver Function Tests: No results for input(s): AST, ALT, ALKPHOS, BILITOT, PROT, ALBUMIN in the last 168 hours. No results for input(s): LIPASE, AMYLASE in the last 168 hours. No results for input(s): AMMONIA in the last 168 hours. Coagulation Profile: No results for input(s): INR, PROTIME in the last 168 hours. Cardiac Enzymes: No results for input(s): CKTOTAL, CKMB, CKMBINDEX, TROPONINI in the last 168 hours. BNP (last 3 results) No results for input(s): PROBNP in the last 8760 hours. HbA1C: No results for input(s): HGBA1C in the last 72 hours. CBG: Recent Labs  Lab 08/30/17 0644  GLUCAP 147*   Lipid Profile: No results for input(s): CHOL, HDL, LDLCALC, TRIG, CHOLHDL, LDLDIRECT in the last 72 hours. Thyroid Function Tests: No results for input(s): TSH, T4TOTAL, FREET4, T3FREE, THYROIDAB in the last 72 hours. Anemia  Panel: No results for input(s): VITAMINB12, FOLATE, FERRITIN, TIBC, IRON, RETICCTPCT in the last 72 hours. Urine analysis:    Component Value Date/Time   COLORURINE YELLOW 06/22/2016 1207   APPEARANCEUR HAZY (A) 06/22/2016 1207   LABSPEC 1.012 06/22/2016 1207   LABSPEC 1.020 02/24/2016 1119   PHURINE 6.0 06/22/2016 1207   GLUCOSEU 50 (A) 06/22/2016 1207   GLUCOSEU Negative 02/24/2016 1119   HGBUR SMALL (A) 06/22/2016 1207   BILIRUBINUR NEGATIVE 06/22/2016 1207   BILIRUBINUR Negative 02/24/2016 1119   KETONESUR NEGATIVE 06/22/2016 1207   PROTEINUR 30 (A) 06/22/2016 1207   UROBILINOGEN 0.2 02/24/2016 1119   NITRITE NEGATIVE 06/22/2016 1207   LEUKOCYTESUR NEGATIVE 06/22/2016 1207   LEUKOCYTESUR Trace 02/24/2016 1119   Sepsis Labs: _0 (procalcitonin:4,lacticidven:4) )No results found for this or any previous visit (from the past 240 hour(s)).   Radiological Exams on Admission: Ct Head Wo Contrast  Result Date: 09/01/2017 CLINICAL DATA:  Seizure. History of breast cancer and radiation therapy. EXAM: CT HEAD WITHOUT CONTRAST TECHNIQUE: Contiguous axial images were obtained from the base of the skull through the vertex without intravenous contrast. COMPARISON:  Brain MRI 06/29/2016 FINDINGS: Brain: No mass lesion, intraparenchymal hemorrhage or extra-axial collection. No evidence of acute cortical infarct. Normal appearance of the brain parenchyma and extra axial spaces for age. Vascular: No hyperdense vessel or unexpected vascular calcification. Skull: Normal visualized skull base, calvarium and extracranial soft tissues. Sinuses/Orbits: No sinus fluid levels or advanced mucosal thickening. No mastoid effusion. Normal orbits. IMPRESSION: Normal brain. Electronically Signed   By: Ulyses Jarred M.D.   On: 09/01/2017 22:17  EKG: Independently reviewed. Sinus tachycardia (rate 116), non-specific T-wave abnormality.   Assessment/Plan   1. Seizure-like activity  - Presents after a  series of episodes marked by loss of awareness and shaking of RUE and RLE, followed by period of lethargy   - She has had similar episodes for more than a year, but much more frequent now  - She has been evaluated by cardiology for possible syncope and had loop recorder placed 08/30/17  - She has been under the care of neurology outpatient with 48-hr EEG scheduled for 09/04/17  - Head CT is a normal study and no focal deficits elicited on exam  - Neurology is consulting and much appreciated, will follow-up on recommendations  - Continue seizure precautions, EEG has been ordered by neuro   2. 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 Breast cancer, right  - Status-post lumpectomy in 2017, followed by chemotherapy and radiation  - Continue Aromasin   4. CAD  - No anginal complaints  - Continue ASA, Plavix, statin    5. Chronic diastolic CHF  - Appears hypovolemic on admission  - Hydrating as above in light of AKI  - Follow I/O's   6. Insulin-dependent DM  - A1c was 7.7% in December 2018  - Managed at home with insulin pump and Trulicity, held on admission  - Check CBG's and use Novolog per sliding-scale while in hospital    DVT prophylaxis: sq heparin  Code Status: Full  Family Communication: Family updated at bedside Consults called: Neurology Admission status: Observation    Vianne Bulls, MD [facility] Pager [phone]  If 7PM-7AM, please contact night-coverage [email or web address] Password [facility]  09/02/2017, 12:13 AM

## 2017-09-02 NOTE — Progress Notes (Signed)
Cath lab staff called to inquire about having someone to come and interrogate pt's loop recorder as  MD ordered. Given name and number of person to call .Marland KitchenMarland KitchenMarland KitchenTomi Bamberger [phone]. Contacted her and information of pt name and location given, she will be coming to see pt.

## 2017-09-02 NOTE — Consult Note (Signed)
Neurology Consultation Reason for Consult: Spells Referring Physician: Cathi Roan, C  CC: Spells  History is obtained from: Patient, family  HPI: Hannah [name] is a 72 y.o. female who presents with abnormal spells.  She first had spells a year ago and that time there was question of partial complex seizures, however she was found to be profoundly orthostatic at the time and therefore her changes in consciousness were attributed to this.  She then had no further episodes for about a year.  Over the past 2 weeks she has begun having spells again.  She is completely amnestic to the spells.  She expresses no lightheadedness prior to the spells.  They happen predominantly when standing, but she has had two while sitting.    She was scheduled for outpatient ambulatory EEG to start on Wednesday, however, today she had a total of 6 episodes and therefore she sought further care in the emergency department.  Of note, she has had a single definite previous seizure at the onset of stroke.  Her son has filmed 2 of these episodes, she has some right arm jerking activity that could be clonic activity, it is unclear from the video what her legs are doing, however family reports bilateral leg shaking with the events.   She reports no recent medication changes other than changing from continuous release tramadol to immediate release.   ROS: A 14 point ROS was performed and is negative except as noted in the HPI.   Past Medical History:  Diagnosis Date  . Abnormally small mouth   . Anemia   . Bilateral carotid artery stenosis    Bilateral ICA 40-59%/  >50% LECA  . Breast cancer ([place]) 01/18/2016   right breast  . Breast cancer of upper-inner quadrant of right female breast ([place]) 12/16/2015  . CAD (coronary artery disease) cardiologist-  dr Aundra Dubin   Remote CABG in 1998 with redo in 2009  . CHF (congestive heart failure) (HCC)    EF is low normal at 50 to 55% per echo in Jan. 2011  . GERD (gastroesophageal  reflux disease)   . Hearing loss of right ear   . History of non-ST elevation myocardial infarction (NSTEMI)    Sept 2014--  thought to be type II HTN w/ LHC without infarct related artery and patent grafts  . History of radiation therapy 05/24/16-07/26/16   right breast 50.4 Gy in 28 fractions, right breast boost 10 Gy in 5 fractions  . Hyperlipidemia   . Hypertension    Resolved.  . Iron deficiency anemia   . Moderate persistent asthma    pulmologist-  Dr. Malvin Johns  . Nonischemic cardiomyopathy ([place])   . PAF (paroxysmal atrial fibrillation) ([place])   . Personal history of chemotherapy 2017  . Personal history of radiation therapy 2017  . Psoriasis    right leg  . Renal calculus, right   . Renal insufficiency, mild   . S/P AVR    prosthesis valve placement 2009 at same time re-do CABG  . 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 Type 2 diabetes mellitus ([place])    monitored by dr Dwyane Dee     Family History  Problem Relation Age of Onset  . Heart disease Father   . Heart failure Father   . Diabetes Maternal Grandmother   . Heart disease Maternal Grandmother   . Diabetes Son   . Healthy Brother        #1  . Heart attack Brother        #  2  . Heart disease Brother        #2     Social History:  reports that she has never smoked. She has never used smokeless tobacco. She reports that she does not drink alcohol or use drugs.   Exam: Current vital signs: BP 123/65   Pulse (!) 107   Temp 99.2 F (37.3 C) (Oral)   Resp (!) 23   SpO2 95%  Vital signs in last 24 hours: Temp:  [99.2 F (37.3 C)] 99.2 F (37.3 C) (03/24 2004) Pulse Rate:  [81-112] 107 (03/25 0000) Resp:  [19-27] 23 (03/25 0000) BP: (123-137)/(57-71) 123/65 (03/25 0000) SpO2:  [95 %-100 %] 95 % (03/25 0000)   Physical Exam  Constitutional: Appears well-developed and well-nourished.  Psych: Affect appropriate to situation Eyes: No scleral injection HENT: No OP obstrucion Head: Normocephalic.   Cardiovascular: Normal rate and regular rhythm.  Respiratory: Effort normal, non-labored breathing GI: Soft.  No distension. There is no tenderness.  Skin: WDI  Neuro: Mental Status: Patient is awake, alert, oriented to person, place, month, year, and situation. Patient is able to give a clear and coherent history. No signs of aphasia or neglect Cranial Nerves: II: Visual Fields are full. Pupils are equal, round, and reactive to light.   III,IV, VI: EOMI without ptosis or diploplia.  V: Facial sensation is symmetric to temperature VII: Facial movement is symmetric.  VIII: hearing is intact to voice X: Uvula elevates symmetrically XI: Shoulder shrug is symmetric. XII: tongue is midline without atrophy or fasciculations.  Motor: Tone is normal. Bulk is normal.  Her left arm is in a sling and she is unable to use it.  Her right arm is full strength, good strength in bilateral legs Sensory: She has no sensation to temperature until just below the knee, decreased vibration to the knees. Deep Tendon Reflexes: Depressed throughout Cerebellar: Does not perform  I have reviewed labs in epic and the results pertinent to this consultation are: UA- likely UTI  I have reviewed the images obtained: CT head-negative  Impression: 72 year old female with recurrent stereotyped episodes which have features both of syncope as well as partial seizures.  My suspicion is that they represent presyncope, however given the unusual features I do think confirmation with capturing an episode on EEG would be prudent.  Recommendations: 1) Orthostatic vital signs 2) Prolonged EEG to try and capture a spell 3) no antiepileptics at this time.   Roland Rack, MD [facility] [phone]  If 7pm- 7am, please page neurology on call as listed in [place].

## 2017-09-02 NOTE — Progress Notes (Signed)
LTM running - push button tested

## 2017-09-02 NOTE — Progress Notes (Signed)
DM coordinator contacted about pt having been on an insulin pump, which was removed last night after admit. Pt will need further insulin orders to meet her needs to control her glucose. After speaking with DM coordinator Sonia Baller, she will contact Dr Eliseo Squires to discuss pt insulin needs and orders.

## 2017-09-02 NOTE — Progress Notes (Addendum)
This patient was seen today at 1:42 AM Dr. Leonel Ramsay   Subjective: At this time patient describes these events as occurring while sitting down and standing up.  This event occurred after she had stood up from urinating she does not recall the event.  Husband states that she had some jerking of legs and arms.  Cannot recall if she awoke without any confusion.  She denies any clouding of vision or tunnel vision.  They state that it can occur when she stands up right away, it can occur after she stands up and she is been up for 10-15 minutes, it can also occur when she sitting down.  As stated below she has had a loop recorder back in 2016 which did show SVT and possible a flutter.  Cardiology has implanted another loop recorder to evaluate further cardiology arrhythmias.  Husband tells me that events exactly like this happened in 2016 and she was found to have orthostatic blood pressures due to the medications and medications had to be adjusted.  Exam: Vitals:   09/02/17 0632 09/02/17 0633  BP: (!) 127/58 (!) 126/59  Pulse:    Resp:    Temp:    SpO2:      Physical Exam   HEENT-  Normocephalic, no lesions, without obvious abnormality.  Normal external eye and conjunctiva.   Cardiovascular- S1-S2 audible, pulses palpable throughout   Lungs-no rhonchi or wheezing noted, no excessive working breathing.  Saturations within normal limits Abdomen- All 4 quadrants palpated and nontender Extremities- Warm, dry and intact Musculoskeletal-no joint tenderness, deformity or swelling Skin-warm and dry, no hyperpigmentation, vitiligo, or suspicious lesions    Neuro:  Significant change in physical exam today Mental Status: Alert, oriented, thought content appropriate.  Speech fluent without evidence of aphasia.  Able to follow 3 step commands without difficulty. Cranial Nerves: II: Discs flat bilaterally; Visual fields grossly normal,  III,IV, VI: ptosis not present, extra-ocular motions  intact bilaterally pupils equal, round, reactive to light and accommodation V,VII: smile symmetric, facial light touch sensation normal bilaterally VIII: hearing normal bilaterally IX,X: uvula rises symmetrically XI: bilateral shoulder shrug XII: midline tongue extension Motor: Tone is normal. Bulk is normal.  Her left arm is in a sling and she is unable to use it.  Her right arm is full strength, good strength in bilateral legs  Tone and bulk:normal tone throughout; no atrophy noted Sensory: Again is noted that she has no sensation to temperature tell just below the knee with also decreased vibration of the knees. Deep Tendon Reflexes: Depressed throughout Plantars: Right: downgoing   Left: downgoing Cerebellar: normal finger-to-nose the right arm, Gait: Not tested    Medications:  Scheduled: . amiodarone  200 mg Oral Daily  . aspirin EC  81 mg Oral Daily  . clopidogrel  75 mg Oral Daily  . exemestane  25 mg Oral QPC breakfast  . ezetimibe  10 mg Oral Daily  . gabapentin  300 mg Oral Daily  . heparin  5,000 Units Subcutaneous Q8H  . insulin aspart  0-5 Units Subcutaneous QHS  . insulin aspart  0-9 Units Subcutaneous TID WC  . loratadine  10 mg Oral Daily  . mometasone-formoterol  2 puff Inhalation BID  . rosuvastatin  40 mg Oral q1800  . sodium chloride flush  3 mL Intravenous Q12H   Continuous: . sodium chloride 100 mL/hr at 09/02/17 0156   YDX:AJOINOMVEHMCN **OR** acetaminophen, albuterol, ondansetron **OR** ondansetron (ZOFRAN) IV, senna-docusate, traMADol  Pertinent Labs/Diagnostics: On 01/2015  she did have a loop recorder placed at that time it showed Runs of SVT noted, atrial tachycardia versus atypical atrial flutter.  No atrial fibrillation noted  Direct orthostatics have not been obtained however--as they did a standing to sitting to lying.  I am not sure if they were entered in the wrong chronological order.  However upon standing it does show a significant drop  in blood pressure down to 91/63.  I feel as though they should be read on and recorded in the correct chronological order please.  UA does show significant amount of leukocytes.  EEG pending--to be a prolonged EEG    Ct Head Wo Contrast  Result Date: 09/01/2017 CLINICAL DATA:  Seizure. History of breast cancer and radiation therapy. EXAM: CT HEAD WITHOUT CONTRAST TECHNIQUE: Contiguous axial images were obtained from the base of the skull through the vertex without intravenous contrast. COMPARISON:  Brain MRI 06/29/2016 FINDINGS: Brain: No mass lesion, intraparenchymal hemorrhage or extra-axial collection. No evidence of acute cortical infarct. Normal appearance of the brain parenchyma and extra axial spaces for age. Vascular: No hyperdense vessel or unexpected vascular calcification. Skull: Normal visualized skull base, calvarium and extracranial soft tissues. Sinuses/Orbits: No sinus fluid levels or advanced mucosal thickening. No mastoid effusion. Normal orbits. IMPRESSION: Normal brain. Electronically Signed   By: Ulyses Jarred M.D.   On: 09/01/2017 22:17     Etta Quill PA-C [facility] [phone]  09/02/2017, 9:49 AM   NEUROHOSPITALIST ADDENDUM Seen and examined the patient today. Agree with documentation above by PAC/Resident.   ASSESSMENT AND PLAN  As this patient has been seen this morning at 152 this morning no significant change and the impression and plan.  Still awaiting EEG and orthostatic vital signs.  Until these tests have been done again agree with no antiepileptic medications.  As stated above patient's husband states that this exact event occurred about 3 years ago and it was found out that she was orthostatic.  A loop recorder was obtained at that time and did show some SVT and possible atrial flutter/fib.  Cardiology has implanted another loop recorder on this event.  Impression: 72 year old female with recurrent stereotyped episodes which have features  both of syncope as well as partial seizures.  My suspicion is that they represent presyncope, however given the unusual features patient was placed on LTM EEG. She has been encouraged to sit up in chair, stand up as these seem to provoke spells.    Recommendations: 1) Orthostatic vital signs 2) Prolonged EEG to try and capture a spell 3) no antiepileptics at this time.    Karena Addison Aroor MD [facility] [phone]  If 7pm to 7am, please call on call as listed on AMION.

## 2017-09-02 NOTE — Progress Notes (Signed)
Pt declines taking heparin sq, pt states she has had it before and went home and would continue to have blood oozing from abdominal injection sites.   Pt taking asa and plavix then and currently and declines further blood thinning meds.

## 2017-09-02 NOTE — Progress Notes (Signed)
Patient admitted after midnight, please see H&P.  Orthostatics and ILR interrogation pending.  Long term EEG in progress.  Neurology consult appreciated.  Had E coli on urine culture in January but did not endorse symptoms so was not treated.  ? If need to treat?  Eulogio Bear DO

## 2017-09-03 ENCOUNTER — Ambulatory Visit (INDEPENDENT_AMBULATORY_CARE_PROVIDER_SITE_OTHER): Payer: Medicare Other | Admitting: Orthopaedic Surgery

## 2017-09-03 DIAGNOSIS — R569 Unspecified convulsions: Secondary | ICD-10-CM | POA: Diagnosis not present

## 2017-09-03 DIAGNOSIS — N184 Chronic kidney disease, stage 4 (severe): Secondary | ICD-10-CM | POA: Diagnosis not present

## 2017-09-03 DIAGNOSIS — I10 Essential (primary) hypertension: Secondary | ICD-10-CM | POA: Diagnosis not present

## 2017-09-03 DIAGNOSIS — N179 Acute kidney failure, unspecified: Secondary | ICD-10-CM | POA: Diagnosis not present

## 2017-09-03 LAB — BASIC METABOLIC PANEL
Anion gap: 12 (ref 5–15)
BUN: 44 mg/dL — ABNORMAL HIGH (ref 6–20)
CO2: 25 mmol/L (ref 22–32)
Calcium: 8.7 mg/dL — ABNORMAL LOW (ref 8.9–10.3)
Chloride: 104 mmol/L (ref 101–111)
Creatinine, Ser: 2.63 mg/dL — ABNORMAL HIGH (ref 0.44–1.00)
GFR calc Af Amer: 20 mL/min — ABNORMAL LOW (ref >60)
GFR calc non Af Amer: 17 mL/min — ABNORMAL LOW (ref >60)
Glucose, Bld: 237 mg/dL — ABNORMAL HIGH (ref 65–99)
Potassium: 3.4 mmol/L — ABNORMAL LOW (ref 3.5–5.1)
Sodium: 141 mmol/L (ref 135–145)

## 2017-09-03 LAB — GLUCOSE, CAPILLARY
Glucose-Capillary: 164 mg/dL — ABNORMAL HIGH (ref 65–99)
Glucose-Capillary: 194 mg/dL — ABNORMAL HIGH (ref 65–99)

## 2017-09-03 LAB — CBC
HCT: 25.9 % — ABNORMAL LOW (ref 36.0–46.0)
Hemoglobin: 8.1 g/dL — ABNORMAL LOW (ref 12.0–15.0)
MCH: 29.7 pg (ref 26.0–34.0)
MCHC: 31.3 g/dL (ref 30.0–36.0)
MCV: 94.9 fL (ref 78.0–100.0)
Platelets: 185 10*3/uL (ref 150–400)
RBC: 2.73 MIL/uL — ABNORMAL LOW (ref 3.87–5.11)
RDW: 13.8 % (ref 11.5–15.5)
WBC: 7.8 10*3/uL (ref 4.0–10.5)

## 2017-09-03 MED ORDER — ACETAMINOPHEN 325 MG PO TABS: 650.0000 mg | ORAL_TABLET | Freq: Four times a day (QID) | ORAL | Status: DC | PRN

## 2017-09-03 MED ORDER — POTASSIUM CHLORIDE CRYS ER 20 MEQ PO TBCR
40.0000 meq | EXTENDED_RELEASE_TABLET | Freq: Once | ORAL | Status: AC
  Administered 2017-09-03: 40 meq via ORAL
  Filled 2017-09-03: qty 2

## 2017-09-03 NOTE — Procedures (Signed)
LTM-EEG Report  HISTORY: Continuous video-EEG monitoring performed for 72 year old with shaking spells.  ACQUISITION: International 10-20 system for electrode placement; 18 channels with additional eyes linked to ipsilateral ears and EKG. Additional T1-T2 electrodes were used. Continuous video recording obtained.   EEG NUMBER:  MEDICATIONS:  Day 1: GBP  DAY #1: from  1105 09/02/17 to 0730 09/03/17 BACKGROUND: An overall medium voltage continuous recording with good spontaneous variability and reactivity. Waking background consisted of a medium voltage 9 Hz posterior dominant rhythm bilaterally with low voltage beta activity in the bilateral frontocentral regions and frequent rhythmic theta activity diffusely. Sleep was captured with normal stage II sleep architecture.   EPILEPTIFORM/PERIODIC ACTIVITY: none  SEIZURES: none EVENTS: none  EKG: no significant arrhythmia  SUMMARY: This was a likely normal continuous video EEG with no definite abnormalities. There was excess rhythmic theta activity in the waking background which could be a normal variant pattern. Clinical events of interest have not been captured. There were no epileptiform discharges or seizures.

## 2017-09-03 NOTE — Progress Notes (Signed)
LTM electrodes removed. No skin breakdown.

## 2017-09-03 NOTE — Progress Notes (Signed)
Patient for discharge home with no apparent distress noted accompanied by her husband. Medications and discharge instructions explained to the patient. She verbalized understanding. Copies given to her. IV saline lock and tele pack removed. CCMD notified.

## 2017-09-03 NOTE — Progress Notes (Signed)
Called and advsd Pt that Dr Delice Lesch wants her to keep 48 hr amb EEG scheduled tomorrow 3/27-per Laureen Ochs

## 2017-09-03 NOTE — Discharge Summary (Signed)
 Physician Discharge Summary  Hannah [name] MRN:[id number] DOB: 05/26/1946 DOA: 09/01/2017  PCP: Saguier, Edward, PA-C  Admit date: 09/01/2017 Discharge date: 09/03/2017   Recommendations for Outpatient Follow-Up:   1. For ambulatory EEG monitoring in the AM with Dr. Aquino 2. Loop recorder monitoring by Dr. Taylor-- no correlation with symptoms yet 3. Patient to resume wearing TED hose in case of orthostatic issues and stay hydrated 4. Close follow up with Dr. Patel for anemia and CKD 5. BMP 1 week 6. No driving until by neurology     Discharge Diagnosis:   Principal Problem:   Seizure-like activity (HCC) Active Problems:   Essential hypertension   H/O atrial tachycardia   Type 2 diabetes mellitus (HCC)   Iron deficiency anemia   CAD (coronary artery disease)   CKD (chronic kidney disease), stage IV (HCC)   Chronic diastolic CHF (congestive heart failure) (HCC)   Breast cancer of upper-inner quadrant of right female breast (HCC)   AKI (acute kidney injury) (HCC)   Discharge disposition:  Home.  Discharge Condition: Improved.  Diet recommendation: Low sodium, heart healthy.  Carbohydrate-modified  Wound care: None.   History of Present Illness:   Hannah [name] is a 71 y.o. female with medical history significant for coronary artery disease, insulin-dependent diabetes mellitus, history of right breast cancer, and iron deficiency anemia, now presenting to the emergency department for evaluation of staring and shaking episodes.  Patient has had recurrent episodes going back more than 1 year marked by transient loss of awareness with shaking of her extremities.  She had been evaluated more than a year ago with unremarkable MRI and EEG, and also evaluated for syncope.  She recently saw cardiology for evaluation of this and had a loop recorder implanted.  She has also seen neurology in the outpatient setting with 48-hour EEG scheduled for later this week.  The episodes have  become much more frequent and she now comes into the ED after several episodes today.  She denies headache, change in vision or hearing, or focal numbness or weakness.  Denies fevers or chills.  Denies chest pain or palpitations.     Hospital Course by Problem:   Seizure-like activity  - Presents after a series of episodes marked by loss of awareness and shaking of RUE and RLE, followed by period of lethargy   - She has had similar episodes for more than a year, but much more frequent now  -  loop recorder placed 08/30/17  -  neurology outpatient with 48-hr EEG scheduled for 09/04/17  - Head CT is a normal study and no focal deficits elicited on exam  - Neurology is consulting and much appreciated: EEG done- normal -patient to stop ultram - no sign of infection- no urinary symptoms -? If medications like the ultram not being processed from kidney and causing issues-- close renal follow up  Acute kidney injury superimposed on CKD stage III-IV  - SCr is 2.77 on admission, up from 2.00 last month  - close follow up with Dr. Patel   Breast cancer, right  - Status-post lumpectomy in 2017, followed by chemotherapy and radiation  - Continue Aromasin   CAD  - No anginal complaints  - Continue ASA, Plavix, statin     Chronic diastolic CHF  - Appeared hypovolemic on admission  -given IVF  Insulin-dependent DM  - A1c was 7.7% in December 2018  - Managed at home with insulin pump and Trulicity         Medical Consultants:    Neuro   Discharge Exam:   Vitals:   09/03/17 0845 09/03/17 1131  BP:  (!) 129/51  Pulse:  93  Resp:  16  Temp:  99.6 F (37.6 C)  SpO2: 99% 98%   Vitals:   09/03/17 0400 09/03/17 0735 09/03/17 0845 09/03/17 1131  BP: (!) 127/57 (!) 120/57  (!) 129/51  Pulse: (!) 18 88  93  Resp: _0 Temp: 99 F (37.2 C) 98.4 F (36.9 C)  99.6 F (37.6 C)  TempSrc: Oral Oral  Oral  SpO2: 98% 99% 99% 98%    Gen:  NAD    The results of  significant diagnostics from this hospitalization (including imaging, microbiology, ancillary and laboratory) are listed below for reference.     Procedures and Diagnostic Studies:   Ct Head Wo Contrast  Result Date: 09/01/2017 CLINICAL DATA:  Seizure. History of breast cancer and radiation therapy. EXAM: CT HEAD WITHOUT CONTRAST TECHNIQUE: Contiguous axial images were obtained from the base of the skull through the vertex without intravenous contrast. COMPARISON:  Brain MRI 06/29/2016 FINDINGS: Brain: No mass lesion, intraparenchymal hemorrhage or extra-axial collection. No evidence of acute cortical infarct. Normal appearance of the brain parenchyma and extra axial spaces for age. Vascular: No hyperdense vessel or unexpected vascular calcification. Skull: Normal visualized skull base, calvarium and extracranial soft tissues. Sinuses/Orbits: No sinus fluid levels or advanced mucosal thickening. No mastoid effusion. Normal orbits. IMPRESSION: Normal brain. Electronically Signed   By: Ulyses Jarred M.D.   On: 09/01/2017 22:17     Labs:   Basic Metabolic Panel: Recent Labs  Lab 09/01/17 2005 09/02/17 0621 09/03/17 0839  NA 140 140 141  K 3.6 3.5 3.4*  CL 101 101 104  CO2 _1 GLUCOSE 118* 142* 237*  BUN 41* 42* 44*  CREATININE 2.77* 2.86* 2.63*  CALCIUM 9.3 8.8* 8.7*   GFR Estimated Creatinine Clearance: 21.5 mL/min (A) (by C-G formula based on SCr of 2.63 mg/dL (H)). Liver Function Tests: No results for input(s): AST, ALT, ALKPHOS, BILITOT, PROT, ALBUMIN in the last 168 hours. No results for input(s): LIPASE, AMYLASE in the last 168 hours. No results for input(s): AMMONIA in the last 168 hours. Coagulation profile No results for input(s): INR, PROTIME in the last 168 hours.  CBC: Recent Labs  Lab 09/01/17 2005 09/02/17 0621 09/03/17 0839  WBC 9.2 10.7* 7.8  HGB 9.4* 8.8* 8.1*  HCT 29.5* 28.0* 25.9*  MCV 95.5 94.6 94.9  PLT 235 200 185   Cardiac Enzymes: No  results for input(s): CKTOTAL, CKMB, CKMBINDEX, TROPONINI in the last 168 hours. BNP: Invalid input(s): POCBNP CBG: Recent Labs  Lab 09/02/17 1253 09/02/17 1633 09/02/17 2106 09/03/17 0605 09/03/17 1120  GLUCAP 314* 275* 227* 164* 194*   D-Dimer No results for input(s): DDIMER in the last 72 hours. Hgb A1c No results for input(s): HGBA1C in the last 72 hours. Lipid Profile No results for input(s): CHOL, HDL, LDLCALC, TRIG, CHOLHDL, LDLDIRECT in the last 72 hours. Thyroid function studies No results for input(s): TSH, T4TOTAL, T3FREE, THYROIDAB in the last 72 hours.  Invalid input(s): FREET3 Anemia work up No results for input(s): VITAMINB12, FOLATE, FERRITIN, TIBC, IRON, RETICCTPCT in the last 72 hours. Microbiology No results found for this or any previous visit (from the past 240 hour(s)).   Discharge Instructions:   Discharge Instructions    Diet - low sodium heart healthy   Complete by:  As directed  Diet Carb Modified   Complete by:  As directed    Discharge instructions   Complete by:  As directed    -Consider wearing TED hose -consider stopping ultram if not needed   Increase activity slowly   Complete by:  As directed      Allergies as of 09/03/2017      Reactions   Amoxicillin Rash, Other (See Comments)   Has patient had a PCN reaction causing immediate rash, facial/tongue/throat swelling, SOB or lightheadedness with hypotension: No Has patient had a PCN reaction causing severe rash involving mucus membranes or skin necrosis: Yes Has patient had a PCN reaction that required hospitalization No Has patient had a PCN reaction occurring within the last 10 years: No If all of the above answers are "NO", then may proceed with Cephalosporin use.   Imdur [isosorbide Dinitrate] Other (See Comments)   Headache/severe hypotension/Syncope   Tape    Must use paper tape   Arimidex [anastrozole] Nausea Only   Latex Itching   Tetracycline Rash      Medication  List    TAKE these medications   ACCU-CHEK GUIDE test strip Generic drug:  glucose blood Use to test blood sugar 3 times daily   acetaminophen 325 MG tablet Commonly known as:  TYLENOL Take 2 tablets (650 mg total) by mouth every 6 (six) hours as needed for mild pain (or Fever >/= 101).   albuterol 108 (90 Base) MCG/ACT inhaler Commonly known as:  PROVENTIL HFA;VENTOLIN HFA Inhale 2 puffs into the lungs every 6 (six) hours as needed for wheezing or shortness of breath.   amiodarone 200 MG tablet Commonly known as:  PACERONE Take 1 tablet (200 mg total) by mouth daily.   aspirin 81 MG EC tablet Take 1 tablet (81 mg total) by mouth daily.   clopidogrel 75 MG tablet Commonly known as:  PLAVIX TAKE 1 TABLET BY MOUTH  DAILY What changed:    how much to take  how to take this  when to take this   Dulaglutide 1.5 MG/0.5ML Sopn Commonly known as:  TRULICITY Inject contents of pen once a week What changed:    how much to take  how to take this  when to take this  additional instructions   exemestane 25 MG tablet Commonly known as:  AROMASIN TAKE ONE TABLET BY MOUTH DAILY AFTER BREAKFAST What changed:  See the new instructions.   ezetimibe 10 MG tablet Commonly known as:  ZETIA Take 1 tablet (10 mg total) by mouth daily.   furosemide 20 MG tablet Commonly known as:  LASIX Take 20 mg by mouth daily as needed for fluid.   gabapentin 300 MG capsule Commonly known as:  NEURONTIN TAKE 1 CAPSULE BY MOUTH 3  TIMES DAILY What changed:    how much to take  how to take this  when to take this   insulin lispro 100 UNIT/ML injection Commonly known as:  HUMALOG Inject 76 units daily with  V-GO pump   levocetirizine 5 MG tablet Commonly known as:  XYZAL Take 5 mg by mouth daily.   prochlorperazine 10 MG tablet Commonly known as:  COMPAZINE TAKE 1 TABLET BY MOUTH  EVERY 6 HOURS AS NEEDED FOR NAUSEA OR VOMITING What changed:  See the new instructions.     rosuvastatin 40 MG tablet Commonly known as:  CRESTOR TAKE ONE TABLET BY MOUTH DAILY   SYMBICORT 160-4.5 MCG/ACT inhaler Generic drug:  budesonide-formoterol INHALE TWO PUFFS BY MOUTH TWICE DAILY     traMADol 50 MG tablet Commonly known as:  ULTRAM 1 three times a day as needed. What changed:    how much to take  how to take this  when to take this  reasons to take this  additional instructions   V-GO 30 Kit 1 Device by Does not apply route daily.      Follow-up Information    Saguier, Edward, PA-C Follow up in 1 week(s).   Specialties:  Internal Medicine, Family Medicine Contact information: [address] [place] [address] [phone]        Aquino, Karen M, MD Follow up.   Specialty:  Neurology Why:  for EEG ambulatory Contact information: [address] [place] [address] [phone]        Taylor, Gregg W, MD Follow up.   Specialty:  Cardiology Contact information: [address] [place] [address] [phone]            Time coordinating discharge: 35 min  Signed:  Jessica U Vann   [facility] 09/03/2017, 3:03 PM          

## 2017-09-03 NOTE — Progress Notes (Addendum)
Reason for consult: Spells   Subjective: No events were captured.    ROS: negative except above   Examination  Vital signs in last 24 hours: Temp:  [98.4 F (36.9 C)-99.6 F (37.6 C)] 99.6 F (37.6 C) (03/26 1131) Pulse Rate:  [18-110] 93 (03/26 1131) Resp:  [16-20] 16 (03/26 1131) BP: (120-147)/(51-62) 129/51 (03/26 1131) SpO2:  [98 %-100 %] 98 % (03/26 1131)  General: Not in distress, cooperative CVS: pulse-normal rate and rhythm RS: breathing comfortably Extremities: normal   Neuro: MS: Alert, oriented, follows commands CN: pupils equal and reactive,  EOMI, face symmetric, tongue midline, normal sensation over face, Motor: 5/5 strength in all 4 extremities Reflexes: 2+ bilaterally over patella, biceps, plantars: flexor Coordination: normal Gait: not tested  Basic Metabolic Panel: Recent Labs  Lab 09/01/17 2005 09/02/17 0621 09/03/17 0839  NA 140 140 141  K 3.6 3.5 3.4*  CL 101 101 104  CO2 25 24 25   GLUCOSE 118* 142* 237*  BUN 41* 42* 44*  CREATININE 2.77* 2.86* 2.63*  CALCIUM 9.3 8.8* 8.7*    CBC: Recent Labs  Lab 09/01/17 2005 09/02/17 0621 09/03/17 0839  WBC 9.2 10.7* 7.8  HGB 9.4* 8.8* 8.1*  HCT 29.5* 28.0* 25.9*  MCV 95.5 94.6 94.9  PLT 235 200 185     Coagulation Studies: No results for input(s): LABPROT, INR in the last 72 hours.  Imaging Reviewed:     ASSESSMENT AND PLAN  72 year old female with recurrent stereotyped episodes which have features both of syncope as well as partial seizures. My suspicion is that they represent syncope, however given the unusual features patient was placed on LTM EEG. She has been encouraged to sit up in chair, stand up as these seem to provoke spells- however has not had any spells while in hospital.  Convulsive Syncope vs Seizure   Prolonged EEG unfortunately captured no events, no epileptiform activity Will not start on AEDS at this point, low suspicion for seizures F/U results of  orthostatics, cardiac event monitor  Patient will get ambulatory EEG tomorrow, discussed case with her outpatient neurologist Dr Delice Lesch. Avoid driving until cleared by on follow up.    Neurology will sign off.   Karena Addison Ilan Kahrs [facility] Pager Number [phone] For questions after 7pm please refer to AMION to reach the Neurologist on call

## 2017-09-03 NOTE — Progress Notes (Signed)
LTM maintainanced. No skin breakdown was seen.

## 2017-09-03 NOTE — Care Management Obs Status (Signed)
[place] NOTIFICATION   Patient Details  Name: DENAJA VERHOEVEN MRN: [id number] Date of Birth: [birth year]   Medicare Observation Status Notification Given:  Yes    Pollie Friar, RN 09/03/2017, 10:44 AM

## 2017-09-03 NOTE — Care Management Note (Signed)
Case Management Note  Patient Details  Name: Hannah [name] MRN: [id number] Date of Birth: 03-Apr-1946  Subjective/Objective:   Pt in with seizure like activity. She is from home with her spouse.                  Action/Plan: Pt discharging home with self care. Pt has PCP, insurance and transportation home. No further needs per CM.  Expected Discharge Date:  09/03/17               Expected Discharge Plan:  Home/Self Care  In-House Referral:     Discharge planning Services     Post Acute Care Choice:    Choice offered to:     DME Arranged:    DME Agency:     HH Arranged:    HH Agency:     Status of Service:  Completed, signed off  If discussed at [employer] of Stay Meetings, dates discussed:    Additional Comments:  Pollie Friar, RN 09/03/2017, 4:03 PM

## 2017-09-04 ENCOUNTER — Ambulatory Visit (INDEPENDENT_AMBULATORY_CARE_PROVIDER_SITE_OTHER): Payer: Medicare Other | Admitting: Neurology

## 2017-09-04 ENCOUNTER — Other Ambulatory Visit: Payer: Medicare Other

## 2017-09-04 ENCOUNTER — Telehealth: Payer: Self-pay | Admitting: *Deleted

## 2017-09-04 DIAGNOSIS — R404 Transient alteration of awareness: Secondary | ICD-10-CM

## 2017-09-04 DIAGNOSIS — I951 Orthostatic hypotension: Secondary | ICD-10-CM

## 2017-09-04 NOTE — Telephone Encounter (Signed)
Spoke with patient and husband to obtain manual Carelink transmission for review.  Patient recently returned home from the hospital and Carelink monitor transmitted alert for 33 "pause" episodes and 1 "symptom" episode, but no ECGs available.  Manual transmission obtained.  Symptom episode appears SR.  Pause episodes all appear false, ? Signal dropout.  Most recent event on 09/02/17 while she was hospitalized.  Will confirm episodes suggest signal dropout with Dannial Monarch, [employer] rep, as patient reports her ILR was interrogated at [facility] yesterday, 3/26 (not yet scanned in).  Patient is appreciative of call and denies additional questions or concerns at this time.

## 2017-09-05 ENCOUNTER — Ambulatory Visit (INDEPENDENT_AMBULATORY_CARE_PROVIDER_SITE_OTHER): Payer: Medicare Other | Admitting: Orthopedic Surgery

## 2017-09-05 NOTE — Telephone Encounter (Signed)
Reviewed with Tomi Bamberger.  All ECGs suggest signal dropout/poor contact and were reviewed during hospital admission while patient was on telemetry.  ECGs printed and placed in Dr. Macon Large folder for review.

## 2017-09-06 ENCOUNTER — Encounter (HOSPITAL_COMMUNITY): Payer: Self-pay | Admitting: Pharmacy Technician

## 2017-09-06 ENCOUNTER — Other Ambulatory Visit: Payer: Self-pay

## 2017-09-06 ENCOUNTER — Emergency Department (HOSPITAL_COMMUNITY): Payer: Medicare Other

## 2017-09-06 ENCOUNTER — Observation Stay (HOSPITAL_COMMUNITY)
Admission: EM | Admit: 2017-09-06 | Discharge: 2017-09-07 | Disposition: A | Discharge status: Home / Self Care | Payer: Medicare Other | Attending: Internal Medicine | Admitting: Internal Medicine

## 2017-09-06 DIAGNOSIS — G620 Drug-induced polyneuropathy: Secondary | ICD-10-CM | POA: Diagnosis not present

## 2017-09-06 DIAGNOSIS — C50211 Malignant neoplasm of upper-inner quadrant of right female breast: Secondary | ICD-10-CM | POA: Diagnosis not present

## 2017-09-06 DIAGNOSIS — D6959 Other secondary thrombocytopenia: Secondary | ICD-10-CM | POA: Insufficient documentation

## 2017-09-06 DIAGNOSIS — I130 Hypertensive heart and chronic kidney disease with heart failure and stage 1 through stage 4 chronic kidney disease, or unspecified chronic kidney disease: Secondary | ICD-10-CM | POA: Diagnosis not present

## 2017-09-06 DIAGNOSIS — I4581 Long QT syndrome: Secondary | ICD-10-CM | POA: Insufficient documentation

## 2017-09-06 DIAGNOSIS — Z923 Personal history of irradiation: Secondary | ICD-10-CM | POA: Insufficient documentation

## 2017-09-06 DIAGNOSIS — N184 Chronic kidney disease, stage 4 (severe): Secondary | ICD-10-CM | POA: Diagnosis present

## 2017-09-06 DIAGNOSIS — Z833 Family history of diabetes mellitus: Secondary | ICD-10-CM | POA: Insufficient documentation

## 2017-09-06 DIAGNOSIS — R569 Unspecified convulsions: Secondary | ICD-10-CM | POA: Insufficient documentation

## 2017-09-06 DIAGNOSIS — M869 Osteomyelitis, unspecified: Secondary | ICD-10-CM | POA: Insufficient documentation

## 2017-09-06 DIAGNOSIS — Z952 Presence of prosthetic heart valve: Secondary | ICD-10-CM

## 2017-09-06 DIAGNOSIS — Z7982 Long term (current) use of aspirin: Secondary | ICD-10-CM | POA: Insufficient documentation

## 2017-09-06 DIAGNOSIS — R55 Syncope and collapse: Principal | ICD-10-CM | POA: Diagnosis present

## 2017-09-06 DIAGNOSIS — Z87442 Personal history of urinary calculi: Secondary | ICD-10-CM | POA: Insufficient documentation

## 2017-09-06 DIAGNOSIS — T451X5A Adverse effect of antineoplastic and immunosuppressive drugs, initial encounter: Secondary | ICD-10-CM | POA: Diagnosis not present

## 2017-09-06 DIAGNOSIS — I252 Old myocardial infarction: Secondary | ICD-10-CM | POA: Insufficient documentation

## 2017-09-06 DIAGNOSIS — Z91048 Other nonmedicinal substance allergy status: Secondary | ICD-10-CM | POA: Insufficient documentation

## 2017-09-06 DIAGNOSIS — I471 Supraventricular tachycardia: Secondary | ICD-10-CM | POA: Insufficient documentation

## 2017-09-06 DIAGNOSIS — Z880 Allergy status to penicillin: Secondary | ICD-10-CM | POA: Insufficient documentation

## 2017-09-06 DIAGNOSIS — N179 Acute kidney failure, unspecified: Secondary | ICD-10-CM | POA: Insufficient documentation

## 2017-09-06 DIAGNOSIS — I2510 Atherosclerotic heart disease of native coronary artery without angina pectoris: Secondary | ICD-10-CM | POA: Diagnosis present

## 2017-09-06 DIAGNOSIS — Z9641 Presence of insulin pump (external) (internal): Secondary | ICD-10-CM | POA: Insufficient documentation

## 2017-09-06 DIAGNOSIS — Z91040 Latex allergy status: Secondary | ICD-10-CM | POA: Insufficient documentation

## 2017-09-06 DIAGNOSIS — H9191 Unspecified hearing loss, right ear: Secondary | ICD-10-CM | POA: Diagnosis not present

## 2017-09-06 DIAGNOSIS — F319 Bipolar disorder, unspecified: Secondary | ICD-10-CM | POA: Insufficient documentation

## 2017-09-06 DIAGNOSIS — E2740 Unspecified adrenocortical insufficiency: Secondary | ICD-10-CM | POA: Insufficient documentation

## 2017-09-06 DIAGNOSIS — E785 Hyperlipidemia, unspecified: Secondary | ICD-10-CM | POA: Diagnosis present

## 2017-09-06 DIAGNOSIS — I428 Other cardiomyopathies: Secondary | ICD-10-CM

## 2017-09-06 DIAGNOSIS — K219 Gastro-esophageal reflux disease without esophagitis: Secondary | ICD-10-CM | POA: Diagnosis present

## 2017-09-06 DIAGNOSIS — Z8249 Family history of ischemic heart disease and other diseases of the circulatory system: Secondary | ICD-10-CM | POA: Insufficient documentation

## 2017-09-06 DIAGNOSIS — Z8679 Personal history of other diseases of the circulatory system: Secondary | ICD-10-CM

## 2017-09-06 DIAGNOSIS — I5032 Chronic diastolic (congestive) heart failure: Secondary | ICD-10-CM | POA: Diagnosis not present

## 2017-09-06 DIAGNOSIS — Z951 Presence of aortocoronary bypass graft: Secondary | ICD-10-CM | POA: Insufficient documentation

## 2017-09-06 DIAGNOSIS — Z8673 Personal history of transient ischemic attack (TIA), and cerebral infarction without residual deficits: Secondary | ICD-10-CM | POA: Insufficient documentation

## 2017-09-06 DIAGNOSIS — I10 Essential (primary) hypertension: Secondary | ICD-10-CM | POA: Diagnosis present

## 2017-09-06 DIAGNOSIS — D509 Iron deficiency anemia, unspecified: Secondary | ICD-10-CM | POA: Diagnosis not present

## 2017-09-06 DIAGNOSIS — L409 Psoriasis, unspecified: Secondary | ICD-10-CM | POA: Insufficient documentation

## 2017-09-06 DIAGNOSIS — Z79899 Other long term (current) drug therapy: Secondary | ICD-10-CM | POA: Insufficient documentation

## 2017-09-06 DIAGNOSIS — E1122 Type 2 diabetes mellitus with diabetic chronic kidney disease: Secondary | ICD-10-CM | POA: Insufficient documentation

## 2017-09-06 DIAGNOSIS — Z888 Allergy status to other drugs, medicaments and biological substances status: Secondary | ICD-10-CM | POA: Diagnosis not present

## 2017-09-06 DIAGNOSIS — Z794 Long term (current) use of insulin: Secondary | ICD-10-CM | POA: Insufficient documentation

## 2017-09-06 DIAGNOSIS — J4540 Moderate persistent asthma, uncomplicated: Secondary | ICD-10-CM | POA: Insufficient documentation

## 2017-09-06 DIAGNOSIS — Z881 Allergy status to other antibiotic agents status: Secondary | ICD-10-CM | POA: Diagnosis not present

## 2017-09-06 DIAGNOSIS — I480 Paroxysmal atrial fibrillation: Secondary | ICD-10-CM | POA: Insufficient documentation

## 2017-09-06 DIAGNOSIS — E119 Type 2 diabetes mellitus without complications: Secondary | ICD-10-CM

## 2017-09-06 DIAGNOSIS — Z853 Personal history of malignant neoplasm of breast: Secondary | ICD-10-CM | POA: Insufficient documentation

## 2017-09-06 HISTORY — DX: Syncope and collapse: R55

## 2017-09-06 LAB — CBC WITH DIFFERENTIAL/PLATELET
Band Neutrophils: 0 %
Basophils Absolute: 0 10*3/uL (ref 0.0–0.1)
Basophils Relative: 0 %
Blasts: 0 %
Eosinophils Absolute: 0.3 10*3/uL (ref 0.0–0.7)
Eosinophils Relative: 3 %
HCT: 27.6 % — ABNORMAL LOW (ref 36.0–46.0)
Hemoglobin: 8.7 g/dL — ABNORMAL LOW (ref 12.0–15.0)
Lymphocytes Relative: 14 %
Lymphs Abs: 1.2 10*3/uL (ref 0.7–4.0)
MCH: 30.9 pg (ref 26.0–34.0)
MCHC: 31.5 g/dL (ref 30.0–36.0)
MCV: 97.9 fL (ref 78.0–100.0)
Metamyelocytes Relative: 0 %
Monocytes Absolute: 0.4 10*3/uL (ref 0.1–1.0)
Monocytes Relative: 5 %
Myelocytes: 0 %
Neutro Abs: 6.9 10*3/uL (ref 1.7–7.7)
Neutrophils Relative %: 78 %
Other: 0 %
Platelets: 200 10*3/uL (ref 150–400)
Promyelocytes Absolute: 0 %
RBC: 2.82 MIL/uL — ABNORMAL LOW (ref 3.87–5.11)
RDW: 14.4 % (ref 11.5–15.5)
WBC: 8.8 10*3/uL (ref 4.0–10.5)
nRBC: 0 /100 WBC

## 2017-09-06 LAB — GLUCOSE, CAPILLARY: Glucose-Capillary: 125 mg/dL — ABNORMAL HIGH (ref 65–99)

## 2017-09-06 LAB — URINALYSIS, ROUTINE W REFLEX MICROSCOPIC
Bilirubin Urine: NEGATIVE
Glucose, UA: 50 mg/dL — AB
Ketones, ur: NEGATIVE mg/dL
Nitrite: NEGATIVE
Protein, ur: NEGATIVE mg/dL
Specific Gravity, Urine: 1.011 (ref 1.005–1.030)
pH: 5 (ref 5.0–8.0)

## 2017-09-06 LAB — I-STAT TROPONIN, ED: Troponin i, poc: 0.03 ng/mL (ref 0.00–0.08)

## 2017-09-06 LAB — COMPREHENSIVE METABOLIC PANEL
ALT: 52 U/L (ref 14–54)
AST: 62 U/L — ABNORMAL HIGH (ref 15–41)
Albumin: 2.8 g/dL — ABNORMAL LOW (ref 3.5–5.0)
Alkaline Phosphatase: 85 U/L (ref 38–126)
Anion gap: 10 (ref 5–15)
BUN: 54 mg/dL — ABNORMAL HIGH (ref 6–20)
CO2: 22 mmol/L (ref 22–32)
Calcium: 9.1 mg/dL (ref 8.9–10.3)
Chloride: 106 mmol/L (ref 101–111)
Creatinine, Ser: 2.98 mg/dL — ABNORMAL HIGH (ref 0.44–1.00)
GFR calc Af Amer: 17 mL/min — ABNORMAL LOW (ref >60)
GFR calc non Af Amer: 15 mL/min — ABNORMAL LOW (ref >60)
Glucose, Bld: 204 mg/dL — ABNORMAL HIGH (ref 65–99)
Potassium: 4.2 mmol/L (ref 3.5–5.1)
Sodium: 138 mmol/L (ref 135–145)
Total Bilirubin: 0.8 mg/dL (ref 0.3–1.2)
Total Protein: 5.7 g/dL — ABNORMAL LOW (ref 6.5–8.1)

## 2017-09-06 LAB — CBG MONITORING, ED: Glucose-Capillary: 151 mg/dL — ABNORMAL HIGH (ref 65–99)

## 2017-09-06 LAB — I-STAT CG4 LACTIC ACID, ED: Lactic Acid, Venous: 2.19 mmol/L (ref 0.5–1.9)

## 2017-09-06 LAB — BRAIN NATRIURETIC PEPTIDE: B Natriuretic Peptide: 58 pg/mL (ref 0.0–100.0)

## 2017-09-06 MED ORDER — ASPIRIN EC 81 MG PO TBEC
81.0000 mg | DELAYED_RELEASE_TABLET | Freq: Every day | ORAL | Status: DC
  Administered 2017-09-07: 81 mg via ORAL
  Filled 2017-09-06: qty 1

## 2017-09-06 MED ORDER — GABAPENTIN 100 MG PO CAPS
200.0000 mg | ORAL_CAPSULE | Freq: Two times a day (BID) | ORAL | Status: DC
  Administered 2017-09-06 – 2017-09-07 (×2): 200 mg via ORAL
  Filled 2017-09-06 (×2): qty 2

## 2017-09-06 MED ORDER — DULAGLUTIDE 1.5 MG/0.5ML ~~LOC~~ SOAJ: 1.5000 mg | SUBCUTANEOUS | Status: DC

## 2017-09-06 MED ORDER — TRAMADOL HCL 50 MG PO TABS: 50.0000 mg | ORAL_TABLET | Freq: Two times a day (BID) | ORAL | Status: DC | PRN

## 2017-09-06 MED ORDER — SODIUM CHLORIDE 0.9 % IV BOLUS
500.0000 mL | Freq: Once | INTRAVENOUS | Status: AC
  Administered 2017-09-06: 500 mL via INTRAVENOUS

## 2017-09-06 MED ORDER — EXEMESTANE 25 MG PO TABS
25.0000 mg | ORAL_TABLET | Freq: Every day | ORAL | Status: DC
  Administered 2017-09-07: 25 mg via ORAL
  Filled 2017-09-06: qty 1

## 2017-09-06 MED ORDER — CLOPIDOGREL BISULFATE 75 MG PO TABS
75.0000 mg | ORAL_TABLET | Freq: Every day | ORAL | Status: DC
  Administered 2017-09-07: 75 mg via ORAL
  Filled 2017-09-06: qty 1

## 2017-09-06 MED ORDER — ROSUVASTATIN CALCIUM 40 MG PO TABS
40.0000 mg | ORAL_TABLET | Freq: Every day | ORAL | Status: DC
  Administered 2017-09-07: 40 mg via ORAL
  Filled 2017-09-06: qty 1

## 2017-09-06 MED ORDER — ONDANSETRON HCL 4 MG PO TABS: 4.0000 mg | ORAL_TABLET | Freq: Four times a day (QID) | ORAL | Status: DC | PRN

## 2017-09-06 MED ORDER — EZETIMIBE 10 MG PO TABS
10.0000 mg | ORAL_TABLET | Freq: Every day | ORAL | Status: DC
  Administered 2017-09-07: 10 mg via ORAL
  Filled 2017-09-06: qty 1

## 2017-09-06 MED ORDER — SODIUM CHLORIDE 0.9% FLUSH
3.0000 mL | Freq: Two times a day (BID) | INTRAVENOUS | Status: DC
  Administered 2017-09-06 – 2017-09-07 (×2): 3 mL via INTRAVENOUS

## 2017-09-06 MED ORDER — ACETAMINOPHEN 325 MG PO TABS
650.0000 mg | ORAL_TABLET | Freq: Four times a day (QID) | ORAL | Status: DC | PRN
  Administered 2017-09-07: 650 mg via ORAL
  Filled 2017-09-06: qty 2

## 2017-09-06 MED ORDER — ONDANSETRON HCL 4 MG/2ML IJ SOLN: 4.0000 mg | Freq: Four times a day (QID) | INTRAMUSCULAR | Status: DC | PRN

## 2017-09-06 MED ORDER — ACETAMINOPHEN 650 MG RE SUPP: 650.0000 mg | Freq: Four times a day (QID) | RECTAL | Status: DC | PRN

## 2017-09-06 MED ORDER — MOMETASONE FURO-FORMOTEROL FUM 200-5 MCG/ACT IN AERO
2.0000 | INHALATION_SPRAY | Freq: Two times a day (BID) | RESPIRATORY_TRACT | Status: DC
  Administered 2017-09-07: 2 via RESPIRATORY_TRACT
  Filled 2017-09-06: qty 8.8

## 2017-09-06 MED ORDER — COSYNTROPIN 0.25 MG IJ SOLR
0.2500 mg | Freq: Once | INTRAMUSCULAR | Status: AC
  Administered 2017-09-07: 0.25 mg via INTRAVENOUS
  Filled 2017-09-06: qty 0.25

## 2017-09-06 MED ORDER — AMIODARONE HCL 200 MG PO TABS
200.0000 mg | ORAL_TABLET | Freq: Every day | ORAL | Status: DC
  Administered 2017-09-07: 200 mg via ORAL
  Filled 2017-09-06: qty 1

## 2017-09-06 MED ORDER — HEPARIN SODIUM (PORCINE) 5000 UNIT/ML IJ SOLN: 5000.0000 [IU] | Freq: Three times a day (TID) | INTRAMUSCULAR | Status: DC

## 2017-09-06 MED ORDER — INSULIN PUMP
Freq: Three times a day (TID) | SUBCUTANEOUS | Status: DC
  Administered 2017-09-07: 08:00:00 via SUBCUTANEOUS
  Filled 2017-09-06: qty 1

## 2017-09-06 NOTE — H&P (Signed)
[facility] History and Physical   Patient: Hannah [name] EQA:[id number]   PCP: Mackie Pai, PA-C DOB: 1945-07-17   DOA: 09/06/2017   DOS: 09/06/2017   DOS: the patient was seen and examined on 09/06/2017  Patient coming from: The patient is coming from home.  Chief Complaint: Passing out episode  HPI: Hannah [name] is a 72 y.o. female with Past medical history of CAD, IDDM, right breast cancer, IDA, recurrent syncope, chronic neuropathy. Patient presented with episode of passing out. Since last 3 weeks patient has been having recurrent episodes of syncope.  Patient has sustained fracture of her left humerus associated with 1 of these episodes. Patient was recently hospitalized and discharged a few days ago. Patient was due to follow-up with neurology for 48-hour EEG monitoring. Today after going to the neurology clinic after completing her 48-hour EEG patient went to a diner.  There patient started to become sluggish again and per her husband passed out. Before this she was complaining of having some neck pain. After the complaint of the neck pain the patient does not remember anything until the EMT arrived. Per husband patient had an episode of vomiting after passing out with greenish bile without any blood. When EMT arrived patient's blood pressure was 88/40. Patient was hooked up to the EKG with the EMT and she had another episode with the EMT and this episode was captured on EEG. Reportedly at that time her blood pressure was also in the lower side and therefore patient was referred to come to the ER for further workup.  ED Course: Initial presentation blood pressure was 90/34.  Patient was given 500 cc normal saline bolus.  Patient remained is not noted throughout her course in the ED.  Patient was referred for admission.  At her baseline ambulates with support And is independent for most of her ADL; manages her medication on her own.  Review of Systems: as mentioned in  the history of present illness.  All other systems reviewed and are negative.  Past Medical History:  Diagnosis Date  . Abnormally small mouth   . Anemia   . Bilateral carotid artery stenosis    Bilateral ICA 40-59%/  >50% LECA  . Breast cancer ([place]) 01/18/2016   right breast  . Breast cancer of upper-inner quadrant of right female breast ([place]) 12/16/2015  . CAD (coronary artery disease) cardiologist-  dr Aundra Dubin   Remote CABG in 1998 with redo in 2009  . CHF (congestive heart failure) (HCC)    EF is low normal at 50 to 55% per echo in Jan. 2011  . GERD (gastroesophageal reflux disease)   . Hearing loss of right ear   . History of non-ST elevation myocardial infarction (NSTEMI)    Sept 2014--  thought to be type II HTN w/ LHC without infarct related artery and patent grafts  . History of radiation therapy 05/24/16-07/26/16   right breast 50.4 Gy in 28 fractions, right breast boost 10 Gy in 5 fractions  . Hyperlipidemia   . Hypertension    Resolved.  . Iron deficiency anemia   . Moderate persistent asthma    pulmologist-  Dr. Malvin Johns  . Nonischemic cardiomyopathy ([place])   . PAF (paroxysmal atrial fibrillation) ([place])   . Personal history of chemotherapy 2017  . Personal history of radiation therapy 2017  . Psoriasis    right leg  . Renal calculus, right   . Renal insufficiency, mild   . S/P AVR    prosthesis  valve placement 2009 at same time re-do CABG  . Stroke [facility])    residual rt hearing loss  . Type 2 diabetes mellitus ([place])    monitored by dr Dwyane Dee   Past Surgical History:  Procedure Laterality Date  . AORTIC VALVE RE[facility]ACEMENT  [facility]   [facility] same time is [facility]  . BREAST LUMPECTOMY Right 01/18/2016  . BREAST LUMPECTOMY WITH RADIOACTIVE SEED AND SENTINEL LYMPH NODE BIOPSY Right 01/18/2016   Procedure: RIGHT BREAST LUMPECTOMY WITH RADIOACTIVE SEED AND SENTINEL LYMPH NODE BIOPSY;  Surgeon: DAlphonsa Overall MD;  [facility]  Service:  General;  Laterality: Right;  . CARDIAC CATHETERIZATION  03/23/2008   Pre-redo CABG: L main OK, LAD (T), CFX (T), OM1 99%, RCA (T), LIMA-LAD OK, SVG-OM(?3) OK w/ little florw to OM2, SVG-RCA OK. EF NL  . CARPAL TUNNEL RELEASE    . COLONOSCOPY    . CORONARY ARTERY BYPASS GRAFT  1998 &  re-do 2009   Had LIMA to DX/LAD, SVG to 2 marginal branches and [facility]; SVG to 3rd OM and PD at time of redo  . CYSTOSCOPY W/ URETERAL STENT PLACEMENT Right 12/20/2014   Procedure: CYSTOSCOPY WITH RETROGRADE PYELOGRAM/URETERAL STENT PLACEMENT;  Surgeon: PCleon Gustin MD;  [facility]  Service: Urology;  Laterality: Right;  . EYE SURGERY Bilateral    cataracts  . HOLMIUM LASER APPLICATION Right 72/70/3500  Procedure:  HOLMIUM LASER LITHOTRIPSY;  Surgeon: PCleon Gustin MD;  [facility]  Service: Urology;  Laterality: Right;  . LEFT HEART CATHETERIZATION WITH CORONARY/GRAFT ANGIOGRAM N/A 02/23/2013   Procedure: LEFT HEART CATHETERIZATION WITH CBeatrix Fetters  Surgeon: MBlane Ohara MD;  [facility];  Service: Cardiovascular;  Laterality: N/A;  . LOOP RECORDER INSERTION N/A 08/30/2017   Procedure: LOOP RECORDER INSERTION;  Surgeon: TEvans Lance MD;  [facility];  Service: Cardiovascular;  Laterality: N/A;  . PORTACATH PLACEMENT Left 01/18/2016   Procedure: INSERTION PORT-A-CATH;  Surgeon: DAlphonsa Overall MD;  [facility] [facility]  Service: General;  Laterality: Left;  . portacath removal    . TONSILLECTOMY    . TRANSTHORACIC ECHOCARDIOGRAM  02-24-2013      mild LVH,  ef 50-55%/  AV bioprosthesis was present with very mild stenosis and no regurg., mean grandient 131mg, peak grandient 2020m /  mild MR/  mild LAE and RAE/  moderate TR  . TUBAL LIGATION     Social History:  reports that she has never smoked. She has never used smokeless tobacco. She reports that she does not drink alcohol or use  drugs.  Allergies  Allergen Reactions  . Amoxicillin Rash and Other (See Comments)    Has patient had a PCN reaction causing immediate rash, facial/tongue/throat swelling, SOB or lightheadedness with hypotension: No Has patient had a PCN reaction causing severe rash involving mucus membranes or skin necrosis: Yes Has patient had a PCN reaction that required hospitalization No Has patient had a PCN reaction occurring within the last 10 years: No If all of the above answers are "NO", then may proceed with Cephalosporin use.   . Imdur [Isosorbide Dinitrate] Other (See Comments)    Headache/severe hypotension/Syncope  . Tape     Must use paper tape  . Arimidex [Anastrozole] Nausea Only  . Latex Itching  . Tetracycline Rash     Family History  Problem Relation Age of Onset  . Heart disease Father   .  Heart failure Father   . Diabetes Maternal Grandmother   . Heart disease Maternal Grandmother   . Diabetes Son   . Healthy Brother        #1  . Heart attack Brother        #2  . Heart disease Brother        #2     Prior to Admission medications   Medication Sig Start Date End Date Taking? Authorizing Provider  acetaminophen (TYLENOL) 325 MG tablet Take 2 tablets (650 mg total) by mouth every 6 (six) hours as needed for mild pain (or Fever >/= 101). 09/03/17  Yes Vann, Jessica U, DO  albuterol (PROVENTIL HFA;VENTOLIN HFA) 108 (90 Base) MCG/ACT inhaler Inhale 2 puffs into the lungs every 6 (six) hours as needed for wheezing or shortness of breath.   Yes [provider]  amiodarone (PACERONE) 200 MG tablet Take 1 tablet (200 mg total) by mouth daily. 06/24/17  Yes Evans Lance, MD  aspirin EC 81 MG EC tablet Take 1 tablet (81 mg total) by mouth daily. Patient taking differently: Take 81 mg by mouth every evening.  06/16/13  Yes Lyda Jester M, PA-C  clopidogrel (PLAVIX) 75 MG tablet TAKE 1 TABLET BY MOUTH  DAILY Patient taking differently: TAKE 1 TABLET (75 MG) BY MOUTH  DAILY AFTER BREAKFAST 08/05/17  Yes [name], NP  Dulaglutide (TRULICITY) 1.5 WU/9.8JX SOPN Inject contents of pen once a week Patient taking differently: Inject 1.5 mg into the skin every Saturday.  08/05/17  Yes Elayne Snare, MD  exemestane (AROMASIN) 25 MG tablet TAKE ONE TABLET BY MOUTH DAILY AFTER BREAKFAST Patient taking differently: TAKE ONE TABLET (25 MG) BY MOUTH DAILY AFTER BREAKFAST 06/19/17  Yes Nicholas Lose, MD  ezetimibe (ZETIA) 10 MG tablet Take 1 tablet (10 mg total) by mouth daily. 06/13/17  Yes Elayne Snare, MD  furosemide (LASIX) 20 MG tablet Take 20 mg by mouth daily as needed for fluid.    Yes [provider]  gabapentin (NEURONTIN) 300 MG capsule TAKE 1 CAPSULE BY MOUTH 3  TIMES DAILY Patient taking differently: TAKE 1 CAPSULE (300 MG) BY MOUTH TWICE DAILY 08/26/17  Yes Nicholas Lose, MD  Insulin Disposable Pump (V-GO 30) KIT 1 Device by Does not apply route daily. Patient taking differently: 1 Device by Does not apply route continuous.  08/05/17  Yes Elayne Snare, MD  insulin lispro (HUMALOG KWIKPEN) 100 UNIT/ML KiwkPen Inject 2-6 Units into the skin at bedtime as needed (CBG >200).   Yes [provider]  insulin lispro (HUMALOG) 100 UNIT/ML injection Inject 76 units daily with  V-GO pump Patient taking differently: See admin instructions. with  V-GO pump 12/11/16  Yes Elayne Snare, MD  loratadine (CLARITIN) 10 MG tablet Take 10 mg by mouth daily.   Yes [provider]  prochlorperazine (COMPAZINE) 10 MG tablet TAKE 1 TABLET BY MOUTH  EVERY 6 HOURS AS NEEDED FOR NAUSEA OR VOMITING Patient taking differently: TAKE 1 TABLET ( 10 MG) BY MOUTH  EVERY 6 HOURS AS NEEDED FOR NAUSEA OR VOMITING 05/08/17  Yes Nicholas Lose, MD  rosuvastatin (CRESTOR) 40 MG tablet TAKE ONE TABLET BY MOUTH DAILY Patient taking differently: TAKE ONE TABLET (40 MG) BY MOUTH DAILY AT BEDTIME 08/28/17  Yes [name], NP  SYMBICORT 160-4.5 MCG/ACT inhaler INHALE TWO PUFFS BY  MOUTH TWICE DAILY 02/20/17  Yes Byrum, Rose Fillers, MD  traMADol (ULTRAM) 50 MG tablet 1 three times a day as needed. Patient  taking differently: Take 50 mg by mouth 3 (three) times daily as needed for moderate pain.  08/19/17  Yes Garald Balding, MD  ACCU-CHEK GUIDE test strip Use to test blood sugar 3 times daily 06/07/17   Elayne Snare, MD    Physical Exam: Vitals:   09/06/17 1830 09/06/17 1845 09/06/17 1900 09/06/17 1915  BP: (!) 124/51 114/64 (!) 111/47 (!) 109/53  Pulse:  (!) 103 (!) 102 100  Resp: (!) _0 SpO2:  100% 100% 100%    General: Alert, Awake and Oriented to Time, Place and Person. Appear in moderate distress, affect appropriate Eyes: PERRL, Conjunctiva normal ENT: Oral Mucosa clear moist. Neck: no JVD, no Abnormal Mass Or lumps Cardiovascular: S1 and S2 Present, no Murmur, Peripheral Pulses Present Respiratory: normal respiratory effort, Bilateral Air entry equal and Decreased, no use of accessory muscle, Clear to Auscultation, no Crackles, no wheezes Abdomen: Bowel Sound present, Soft and no tenderness, no hernia Skin: no redness, no Rash, no induration Extremities: no Pedal edema, no calf tenderness Neurologic: Grossly no focal neuro deficit. Bilaterally Equal motor strength  Labs on Admission:  CBC: Recent Labs  Lab 09/01/17 2005 09/02/17 0621 09/03/17 0839 09/06/17 1530  WBC 9.2 10.7* 7.8 8.8  NEUTROABS  --   --   --  6.9  HGB 9.4* 8.8* 8.1* 8.7*  HCT 29.5* 28.0* 25.9* 27.6*  MCV 95.5 94.6 94.9 97.9  PLT 235 200 185 388   Basic Metabolic Panel: Recent Labs  Lab 09/01/17 2005 09/02/17 0621 09/03/17 0839 09/06/17 1530  NA 140 140 141 138  K 3.6 3.5 3.4* 4.2  CL 101 101 104 106  CO2 _1 GLUCOSE 118* 142* 237* 204*  BUN 41* 42* 44* 54*  CREATININE 2.77* 2.86* 2.63* 2.98*  CALCIUM 9.3 8.8* 8.7* 9.1   GFR: Estimated Creatinine Clearance: 19 mL/min (A) (by C-G formula based on SCr of 2.98 mg/dL (H)). Liver Function  Tests: Recent Labs  Lab 09/06/17 1530  AST 62*  ALT 52  ALKPHOS 85  BILITOT 0.8  PROT 5.7*  ALBUMIN 2.8*   No results for input(s): LIPASE, AMYLASE in the last 168 hours. No results for input(s): AMMONIA in the last 168 hours. Coagulation Profile: No results for input(s): INR, PROTIME in the last 168 hours. Cardiac Enzymes: No results for input(s): CKTOTAL, CKMB, CKMBINDEX, TROPONINI in the last 168 hours. BNP (last 3 results) No results for input(s): PROBNP in the last 8760 hours. HbA1C: No results for input(s): HGBA1C in the last 72 hours. CBG: Recent Labs  Lab 09/02/17 1633 09/02/17 2106 09/03/17 0605 09/03/17 1120 09/06/17 1900  GLUCAP 275* 227* 164* 194* 151*   Lipid Profile: No results for input(s): CHOL, HDL, LDLCALC, TRIG, CHOLHDL, LDLDIRECT in the last 72 hours. Thyroid Function Tests: No results for input(s): TSH, T4TOTAL, FREET4, T3FREE, THYROIDAB in the last 72 hours. Anemia Panel: No results for input(s): VITAMINB12, FOLATE, FERRITIN, TIBC, IRON, RETICCTPCT in the last 72 hours. Urine analysis:    Component Value Date/Time   COLORURINE YELLOW 09/02/2017 0055   APPEARANCEUR HAZY (A) 09/02/2017 0055   LABSPEC 1.008 09/02/2017 0055   LABSPEC 1.020 02/24/2016 1119   PHURINE 6.0 09/02/2017 0055   GLUCOSEU NEGATIVE 09/02/2017 0055   GLUCOSEU Negative 02/24/2016 1119   HGBUR LARGE (A) 09/02/2017 0055   BILIRUBINUR NEGATIVE 09/02/2017 0055   BILIRUBINUR Negative 02/24/2016 [place] NEGATIVE 09/02/2017 0055   PROTEINUR 30 (A) 09/02/2017 0055   UROBILINOGEN  0.2 02/24/2016 1119   NITRITE NEGATIVE 09/02/2017 0055   LEUKOCYTESUR MODERATE (A) 09/02/2017 0055   LEUKOCYTESUR Trace 02/24/2016 1119    Radiological Exams on Admission: Dg Chest Portable 1 View  Result Date: 09/06/2017 CLINICAL DATA:  Syncope EXAM: PORTABLE CHEST 1 VIEW COMPARISON:  06/29/2016 FINDINGS: There is no focal parenchymal opacity. There is no pleural effusion or pneumothorax.  The cardiomediastinal silhouette is stable. There is evidence of prior CABG. The osseous structures are unremarkable. IMPRESSION: No active disease. Electronically Signed   By: Kathreen Devoid   On: 09/06/2017 15:58   EKG: Independently reviewed.  Atrial tachycardia. Strip from the EMS report also reviewed, suggest A. fib on 1 of the report as well as sinus tachycardia on the other report.  Assessment/Plan 1. Syncope Extensive workup has been completed for this patient including long-term EEG monitoring as well as loop recorder implant. So far the workup has been negative. Patient has been tried with midodrine in the past with increase in the blood pressure. Cortisol level in the past were low although cosyntropin stimulation test was negative for any adrenal insufficiency. We will recheck the levels tomorrow morning. For definitive treatment for her syncope patient will be started on Florinef after the cosyntropin stimulation test. PT OT consulted. Patient is also on tramadol which she has not taken in last 1 week. Patient is taking gabapentin for last 2-1/2 years. Also taking Claritin. At present I recommend to discontinue Claritin, also avoid using tramadol as well as reducing the gabapentin to 200 mg daily and slowly taper it off.  2.  Type 2 diabetes mellitus. Insulin-dependent. Hemoglobin A1c 7.7 in December 2018. Continue Trulicity which is due tomorrow. Continue insulin pump. Carb modified diet.  3.  breast cancer. S/P lumpectomy in 2017 as well as chemotherapy and radiation. Continue home regimen.  4.  Nausea and vomiting. One episode, currently asymptomatic.  Monitor. Likely associated with her syncopal event.  Nutrition: Carb modified diet DVT Prophylaxis: subcutaneous Heparin  Advance goals of care discussion: full code   Consults: none  Family Communication: family was present at bedside, at the time of interview.  Opportunity was given to ask question and all  questions were answered satisfactorily.  Disposition: Admitted as observation, telemetry unit. Likely to be discharged home, in 1 day.  Author: Berle Mull, MD [facility] Pager: [phone] 09/06/2017  If 7PM-7AM, please contact night-coverage [email or web address] Password [email or web address]

## 2017-09-06 NOTE — ED Provider Notes (Signed)
[facility] EMERGENCY DEPARTMENT Provider Note   CSN: [id number] Arrival date & time: 09/06/17  1448     History   Chief Complaint Chief Complaint  Patient presents with  . Loss of Consciousness    HPI Hannah [name] is a 72 y.o. female.  HPI   Patient is a 72 year old female presenting with syncope.  This is patient's third hospital visit for syncope.  Patient had a fracture of the left humerus and then discharged.  Then readmitted for observation for syncope.  Patient had outpatient 48 hour EEG completed this morning.  Patient has implanted loop recorder.  Was read by [employer].  Patient was eating lunch today, had a just ordered and then all of a sudden felt a pain in the back of her left neck.  This preceded her starting to talk slowly and then slumping over.  Patient vomited several times.  She was unresponsive for about 10 seconds she then became immediately awake again.  When EMS got there they watched her have two more syncopal events.  She was found to be hypotensive.  Past Medical History:  Diagnosis Date  . Abnormally small mouth   . Anemia   . Bilateral carotid artery stenosis    Bilateral ICA 40-59%/  >50% LECA  . Breast cancer ([place]) 01/18/2016   right breast  . Breast cancer of upper-inner quadrant of right female breast ([place]) 12/16/2015  . CAD (coronary artery disease) cardiologist-  dr Aundra Dubin   Remote CABG in 1998 with redo in 2009  . CHF (congestive heart failure) (HCC)    EF is low normal at 50 to 55% per echo in Jan. 2011  . GERD (gastroesophageal reflux disease)   . Hearing loss of right ear   . History of non-ST elevation myocardial infarction (NSTEMI)    Sept 2014--  thought to be type II HTN w/ LHC without infarct related artery and patent grafts  . History of radiation therapy 05/24/16-07/26/16   right breast 50.4 Gy in 28 fractions, right breast boost 10 Gy in 5 fractions  . Hyperlipidemia   . Hypertension    Resolved.  . Iron  deficiency anemia   . Moderate persistent asthma    pulmologist-  Dr. Malvin Johns  . Nonischemic cardiomyopathy ([place])   . PAF (paroxysmal atrial fibrillation) ([place])   . Personal history of chemotherapy 2017  . Personal history of radiation therapy 2017  . Psoriasis    right leg  . Renal calculus, right   . Renal insufficiency, mild   . S/P AVR    prosthesis valve placement 2009 at same time re-do CABG  . 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 Type 2 diabetes mellitus ([place])    monitored by dr Dwyane Dee    Patient Active Problem List   Diagnosis Date Noted  . Seizure-like activity ([place]) 09/01/2017  . AKI (acute kidney injury) ([place]) 09/01/2017  . Adrenal insufficiency ([place]) 07/03/2016  . Seizure ([place]) 06/29/2016  . Chemotherapy-induced peripheral neuropathy ([place]) 04/27/2016  . 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 Osteomyelitis of toe of left foot ([place]) 04/06/2016  . Mania ([place]) 03/05/2016  . GERD (gastroesophageal reflux disease) 02/29/2016  . Chemotherapy induced nausea and vomiting 02/24/2016  . Port catheter in place 02/17/2016  . Sensorineural hearing loss (SNHL) of both ears 01/06/2016  . Breast cancer of upper-inner quadrant of right female breast ([place]) 12/16/2015  . Osteopenia of multiple sites 10/19/2015  . CKD (chronic kidney disease), stage IV (  [place]) 09/24/2013  . Chronic diastolic CHF (congestive heart failure) ([place]) 09/24/2013  . Acute on chronic combined systolic and diastolic congestive heart failure ([place]) 07/17/2013  . Atrial tachycardia ([place]) 04/03/2013  . CAD (coronary artery disease) 01/24/2011  . Chronic coronary artery disease 01/24/2011  . History of aortic valve replacement 10/27/2010  . Hyperlipidemia   . Nonischemic cardiomyopathy ([place])   . Anemia   . Type 2 diabetes mellitus ([place])   . Iron deficiency anemia   . Allergic rhinitis 10/14/2009  . Essential hypertension 05/12/2009  . H/O atrial tachycardia 05/12/2009  . Asthma 05/12/2009     Past Surgical History:  Procedure Laterality Date  . AORTIC VALVE REPLACEMENT  [facility]   [facility]done same time is [facility]G)  . BREAST LUMPECTOMY Right 01/18/2016  . BREAST LUMPECTOMY WITH RADIOACTIVE SEED AND SENTINEL LYMPH NODE BIOPSY Right 01/18/2016   Procedure: RIGHT BREAST LUMPECTOMY WITH RADIOACTIVE SEED AND SENTINEL LYMPH NODE BIOPSY;  Surgeon: DAlphonsa Overall MD;  [facility]  Service: General;  Laterality: Right;  . CARDIAC CATHETERIZATION  03/23/2008   Pre-redo CABG: L main OK, LAD (T), CFX (T), OM1 99%, RCA (T), LIMA-LAD OK, SVG-OM(?3) OK w/ little florw to OM2, SVG-RCA OK. EF NL  . CARPAL TUNNEL RELEASE    . COLONOSCOPY    . CORONARY ARTERY BYPASS GRAFT  1998 &  re-do 2009   Had LIMA to DX/LAD, SVG to 2 marginal [facility]; SVG to 3rd OM and PD at time of redo  . CYSTOSCOPY W/ URETERAL STENT PLACEMENT Right 12/20/2014   Procedure: CYSTOSCOPY WITH RETROGRADE PYELOGRAM/URETERAL STENT PLACEMENT;  Surgeon: PCleon Gustin MD;  [facility]  Service: Urology;  Laterality: Right;  . EYE SURGERY Bilateral    cataracts  . HOLMIUM LASER APPLICATION Right [id number]/6767  Procedure:  HOLMIUM LASER LITHOTRIPSY;  Surgeon: PCleon Gustin MD;  [facility]  Service: Urology;  Laterality: Right;  . LEFT HEART CATHETERIZATION WITH CORONARY/GRAFT ANGIOGRAM N/A 02/23/2013   Procedure: LEFT HEART CATHETERIZATION WITH CBeatrix Fetters  Surgeon: MBlane Ohara MD;  [facility];  Service: Cardiovascular;  Laterality: N/A;  . LOOP RECORDER INSERTION N/A 08/30/2017   Procedure: LOOP RECORDER INSERTION;  Surgeon: TEvans Lance MD;  [facility];  Service: Cardiovascular;  Laterality: N/A;  . PORTACATH PLACEMENT Left 01/18/2016   Procedure: INSERTION PORT-A-CATH;  Surgeon: DAlphonsa Overall MD;  [facility]  Service: General;  Laterality: Left;  . portacath  removal    . TONSILLECTOMY    . TRANSTHORACIC ECHOCARDIOGRAM  02-24-2013      mild LVH,  ef 50-55%/  AV bioprosthesis was present with very mild stenosis and no regurg., mean grandient 179mg, peak grandient 2057m /  mild MR/  mild LAE and RAE/  moderate TR  . TUBAL LIGATION       OB History   None      Home Medications    Prior to Admission medications   Medication Sig Start Date End Date Taking? Authorizing Provider  ACCU-CHEK GUIDE test strip Use to test blood sugar 3 times daily 06/07/17   KumElayne SnareD  acetaminophen (TYLENOL) 325 MG tablet Take 2 tablets (650 mg total) by mouth every 6 (six) hours as needed for mild pain (or Fever >/= 101). 09/03/17   VanEulogio Bear DO  albuterol (PROVENTIL HFA;VENTOLIN HFA) 108 (90 Base) MCG/ACT inhaler Inhale 2 puffs  into the lungs every 6 (six) hours as needed for wheezing or shortness of breath.    [provider]  amiodarone (PACERONE) 200 MG tablet Take 1 tablet (200 mg total) by mouth daily. 06/24/17   Evans Lance, MD  aspirin EC 81 MG EC tablet Take 1 tablet (81 mg total) by mouth daily. 06/16/13   Lyda Jester M, PA-C  clopidogrel (PLAVIX) 75 MG tablet TAKE 1 TABLET BY MOUTH  DAILY Patient taking differently: TAKE 75 MG BY MOUTH  DAILY 08/05/17   [name], NP  Dulaglutide (TRULICITY) 1.5 ZO/1.0RU SOPN Inject contents of pen once a week Patient taking differently: Inject 1.5 mg into the skin every Wednesday.  08/05/17   Elayne Snare, MD  exemestane (AROMASIN) 25 MG tablet TAKE ONE TABLET BY MOUTH DAILY AFTER BREAKFAST Patient taking differently: TAKE 25 MG BY MOUTH DAILY AFTER BREAKFAST 06/19/17   Nicholas Lose, MD  ezetimibe (ZETIA) 10 MG tablet Take 1 tablet (10 mg total) by mouth daily. 06/13/17   Elayne Snare, MD  furosemide (LASIX) 20 MG tablet Take 20 mg by mouth daily as needed for fluid.     [provider]  gabapentin (NEURONTIN) 300 MG capsule TAKE 1 CAPSULE BY MOUTH 3  TIMES DAILY Patient taking  differently: TAKE 300 MG BY MOUTH 3 TIMES DAILY 08/26/17   Nicholas Lose, MD  Insulin Disposable Pump (V-GO 30) KIT 1 Device by Does not apply route daily. 08/05/17   Elayne Snare, MD  insulin lispro (HUMALOG) 100 UNIT/ML injection Inject 76 units daily with  V-GO pump 12/11/16   Elayne Snare, MD  levocetirizine (XYZAL) 5 MG tablet Take 5 mg by mouth daily.    [provider]  prochlorperazine (COMPAZINE) 10 MG tablet TAKE 1 TABLET BY MOUTH  EVERY 6 HOURS AS NEEDED FOR NAUSEA OR VOMITING Patient taking differently: TAKE 10 MG BY MOUTH  EVERY 6 HOURS AS NEEDED FOR NAUSEA OR VOMITING 05/08/17   Nicholas Lose, MD  rosuvastatin (CRESTOR) 40 MG tablet TAKE ONE TABLET BY MOUTH DAILY 08/28/17   [name], NP  SYMBICORT 160-4.5 MCG/ACT inhaler INHALE TWO PUFFS BY MOUTH TWICE DAILY 02/20/17   Collene Gobble, MD  traMADol (ULTRAM) 50 MG tablet 1 three times a day as needed. Patient taking differently: Take 50 mg by mouth 2 (two) times daily as needed for moderate pain.  08/19/17   Garald Balding, MD    Family History Family History  Problem Relation Age of Onset  . Heart disease Father   . Heart failure Father   . Diabetes Maternal Grandmother   . Heart disease Maternal Grandmother   . Diabetes Son   . Healthy Brother        #1  . Heart attack Brother        #2  . Heart disease Brother        #2    Social History Social History   Tobacco Use  . Smoking status: Never Smoker  . Smokeless tobacco: Never Used  Substance Use Topics  . Alcohol use: No  . Drug use: No     Allergies   Amoxicillin; Imdur [isosorbide dinitrate]; Tape; Arimidex [anastrozole]; Latex; and Tetracycline   Review of Systems Review of Systems  Constitutional: Negative for activity change and fever.  Respiratory: Negative for chest tightness and shortness of breath.   Gastrointestinal: Negative for abdominal pain.  Neurological: Positive for syncope and light-headedness.  All other systems reviewed  and are negative.  Physical Exam Updated Vital Signs There were no vitals taken for this visit.  Physical Exam  Constitutional: She is oriented to person, place, and time. She appears well-developed and well-nourished.  HENT:  Head: Normocephalic and atraumatic.  Eyes: Right eye exhibits no discharge. Left eye exhibits no discharge.  Cardiovascular: Normal rate, regular rhythm and normal heart sounds.  No murmur heard. Pulmonary/Chest: Effort normal and breath sounds normal. She has no wheezes. She has no rales.  Abdominal: Soft. She exhibits no distension. There is no tenderness.  Musculoskeletal:  Left arm in sling.  Bruising.  Neurological: She is oriented to person, place, and time. No cranial nerve deficit. Coordination normal.  Skin: Skin is warm and dry. She is not diaphoretic.  Psychiatric: She has a normal mood and affect.  Nursing note and vitals reviewed.    ED Treatments / Results  Labs (all labs ordered are listed, but only abnormal results are displayed) Labs Reviewed  CBC WITH DIFFERENTIAL/PLATELET  COMPREHENSIVE METABOLIC PANEL  URINALYSIS, ROUTINE W REFLEX MICROSCOPIC  BRAIN NATRIURETIC PEPTIDE  I-STAT TROPONIN, ED  I-STAT CG4 LACTIC ACID, ED    EKG EKG Interpretation  Date/Time:  Friday September 06 2017 15:06:18 EDT Ventricular Rate:  91 PR Interval:    QRS Duration: 73 QT Interval:  523 QTC Calculation: 644 R Axis:   -26 Text Interpretation:  no stemii noted on ekg Confirmed by Thomasene Lot, [place] [phone]) on 09/06/2017 3:25:06 PM   Radiology No results found.  Procedures Procedures (including critical care time)  Medications Ordered in ED Medications  sodium chloride 0.9 % bolus 500 mL (has no administration in time range)     Initial Impression / Assessment and Plan / ED Course  I have reviewed the triage vital signs and the nursing notes.  Pertinent labs & imaging results that were available during my care of the patient were  reviewed by me and considered in my medical decision making (see chart for details).     Patient is a 72 year old female presenting with syncope.  This is patient's third hospital visit for syncope.  Patient had a fracture of the left humerus and then discharged.  Then readmitted for observation for syncope.  Patient had outpatient 48 hour EEG completed this morning.  Patient has implanted loop recorder.  Was read by [employer].  Patient was eating lunch today, had a just ordered and then all of a sudden felt a pain in the back of her left neck.  This preceded her starting to talk slowly and then slumping over.  Patient vomited several times.  She was unresponsive for about 10 seconds she then became immediately awake again.  When EMS got there they watched her have two more syncopal events.  She was found to be hypotensive.  4:12 PM Contacted [employer], they will bring on the report for her loop recorder.  Contacted/paged whoever is on for EPS to be involved.  Jana Half came down and found him to be no events today.  She does appear to be A. fib on the EKG done.  She reports that she is has a known history for her.  5:00 Patient initially hypotensive but blood pressure resolved now.  5:15 Discussed with cards. They do not believe that this is cardiac in origin given the normal [employer] tracing.  Is unclear what is exactly calling causing her transient hypotension.  Whether it something systemic.    Given her hypotension, elevated lactic, and syncope, will discuss with medicine.  This could be something  more along the lines of POTS or need PO milirinone, to help boost BP.   Final Clinical Impressions(s) / ED Diagnoses   Final diagnoses:  None    ED Discharge Orders    None       Macarthur Critchley, MD 09/06/17 1835

## 2017-09-06 NOTE — Progress Notes (Signed)
Patient admitted from ED with insulin pump. Patient alert and oriented x 4. Signed contract insulin pump. Patient stated that she only administer bolus before meals and bedtime as needed. Patient blood sugar was 125 and additional insulin was not administered. Patient provided with sheet for accurate documntation of how much blood sugar is, carb. intake and insulin is given during the day. Paged NP on call to changed QAM blood sugar order to [facility] since this is patient normal schedule.   Will continue to monitor.  Chapin Arduini, RN

## 2017-09-06 NOTE — ED Triage Notes (Signed)
Pt bib EMS with reports of syncope. Pt with syncopal episode 3weeks ago ending in hospitalization, break in L arm, syncope 5 days ago, admitted for obs, discharged Tuesday. Pt with loop monitor placed during last admission. Pt with syncopal episode today, called EMS, 2 witnessed syncopal events with EMS. 88/50, HR 102, ekg unremarkable. 100% RA, cbg 237, RR 18. Pt with emesis prior to EMS arrival. Pt alert and oriented upon arrival.

## 2017-09-07 DIAGNOSIS — R55 Syncope and collapse: Secondary | ICD-10-CM | POA: Diagnosis not present

## 2017-09-07 LAB — COMPREHENSIVE METABOLIC PANEL
ALT: 49 U/L (ref 14–54)
AST: 57 U/L — ABNORMAL HIGH (ref 15–41)
Albumin: 2.9 g/dL — ABNORMAL LOW (ref 3.5–5.0)
Alkaline Phosphatase: 79 U/L (ref 38–126)
Anion gap: 8 (ref 5–15)
BUN: 49 mg/dL — ABNORMAL HIGH (ref 6–20)
CO2: 26 mmol/L (ref 22–32)
Calcium: 9 mg/dL (ref 8.9–10.3)
Chloride: 106 mmol/L (ref 101–111)
Creatinine, Ser: 2.69 mg/dL — ABNORMAL HIGH (ref 0.44–1.00)
GFR calc Af Amer: 19 mL/min — ABNORMAL LOW (ref >60)
GFR calc non Af Amer: 17 mL/min — ABNORMAL LOW (ref >60)
Glucose, Bld: 110 mg/dL — ABNORMAL HIGH (ref 65–99)
Potassium: 3.8 mmol/L (ref 3.5–5.1)
Sodium: 140 mmol/L (ref 135–145)
Total Bilirubin: 0.7 mg/dL (ref 0.3–1.2)
Total Protein: 5.8 g/dL — ABNORMAL LOW (ref 6.5–8.1)

## 2017-09-07 LAB — CBC
HCT: 28.7 % — ABNORMAL LOW (ref 36.0–46.0)
Hemoglobin: 8.9 g/dL — ABNORMAL LOW (ref 12.0–15.0)
MCH: 30.4 pg (ref 26.0–34.0)
MCHC: 31 g/dL (ref 30.0–36.0)
MCV: 98 fL (ref 78.0–100.0)
Platelets: 200 10*3/uL (ref 150–400)
RBC: 2.93 MIL/uL — ABNORMAL LOW (ref 3.87–5.11)
RDW: 14.1 % (ref 11.5–15.5)
WBC: 7.8 10*3/uL (ref 4.0–10.5)

## 2017-09-07 LAB — ACTH STIMULATION, 3 TIME POINTS
Cortisol, 30 Min: 26.1 ug/dL
Cortisol, 60 Min: 28.4 ug/dL
Cortisol, Base: 15.2 ug/dL

## 2017-09-07 LAB — GLUCOSE, CAPILLARY
Glucose-Capillary: 101 mg/dL — ABNORMAL HIGH (ref 65–99)
Glucose-Capillary: 236 mg/dL — ABNORMAL HIGH (ref 65–99)

## 2017-09-07 MED ORDER — FLUDROCORTISONE ACETATE 0.1 MG PO TABS
0.1000 mg | ORAL_TABLET | Freq: Every day | ORAL | Status: DC
  Administered 2017-09-07: 0.1 mg via ORAL
  Filled 2017-09-07: qty 1

## 2017-09-07 MED ORDER — FLUDROCORTISONE ACETATE 0.1 MG PO TABS: 0.1000 mg | ORAL_TABLET | Freq: Every day | ORAL | 0 refills | Status: DC

## 2017-09-07 MED ORDER — GABAPENTIN 100 MG PO CAPS: ORAL_CAPSULE | ORAL | 0 refills | Status: DC

## 2017-09-07 MED ORDER — FUROSEMIDE 20 MG PO TABS: 20.0000 mg | ORAL_TABLET | Freq: Every day | ORAL | 0 refills | Status: DC | PRN

## 2017-09-07 NOTE — Progress Notes (Signed)
Pt given avs, all questions answered and entertained, pt dc in wheelchair stable condition

## 2017-09-07 NOTE — Progress Notes (Signed)
Patient has Cosyntropin IV 5:30 AM order and ACTH stimulation lab order. I was informed by phlebotomy that due to heavy load in the morning they are not able to do blood work. Per policy day shift phlebotomy will take care of that. Lab and medications moved from 5:30 am to 7 AM.  Will continue to monitor.  Takeyah Wieman, RN

## 2017-09-09 ENCOUNTER — Telehealth: Payer: Self-pay | Admitting: *Deleted

## 2017-09-09 ENCOUNTER — Ambulatory Visit (INDEPENDENT_AMBULATORY_CARE_PROVIDER_SITE_OTHER): Payer: Medicare Other | Admitting: Orthopaedic Surgery

## 2017-09-09 ENCOUNTER — Ambulatory Visit (INDEPENDENT_AMBULATORY_CARE_PROVIDER_SITE_OTHER): Payer: Medicare Other

## 2017-09-09 ENCOUNTER — Encounter (INDEPENDENT_AMBULATORY_CARE_PROVIDER_SITE_OTHER): Payer: Self-pay | Admitting: Orthopaedic Surgery

## 2017-09-09 ENCOUNTER — Other Ambulatory Visit (INDEPENDENT_AMBULATORY_CARE_PROVIDER_SITE_OTHER): Payer: Self-pay | Admitting: Radiology

## 2017-09-09 VITALS — BP 127/56 | HR 94 | Resp 18 | Ht 65.0 in | Wt 196.0 lb

## 2017-09-09 DIAGNOSIS — S42292D Other displaced fracture of upper end of left humerus, subsequent encounter for fracture with routine healing: Secondary | ICD-10-CM

## 2017-09-09 DIAGNOSIS — S42292A Other displaced fracture of upper end of left humerus, initial encounter for closed fracture: Secondary | ICD-10-CM | POA: Insufficient documentation

## 2017-09-09 DIAGNOSIS — M25512 Pain in left shoulder: Secondary | ICD-10-CM

## 2017-09-09 HISTORY — DX: Other displaced fracture of upper end of left humerus, initial encounter for closed fracture: S42.292A

## 2017-09-09 NOTE — Discharge Summary (Signed)
[facility] Discharge Summary   Patient: Hannah [name] ZOX:[id number]   PCP: Mackie Pai, PA-C DOB: 1946-02-20   Date of admission: 09/06/2017   Date of discharge: 09/07/2017   Discharge Diagnoses:  Principal Problem:   Syncope Active Problems:   Essential hypertension   H/O atrial tachycardia   Hyperlipidemia   Nonischemic cardiomyopathy (HCC)   Type 2 diabetes mellitus (HCC)   Iron deficiency anemia   History of aortic valve replacement   CAD (coronary artery disease)   CKD (chronic kidney disease), stage IV (HCC)   Chronic diastolic CHF (congestive heart failure) ([place])   Breast cancer of upper-inner quadrant of right female breast ([place])   GERD (gastroesophageal reflux disease)   Chemotherapy-induced peripheral neuropathy ([place])   Admitted From: home Disposition:  home  Recommendations for Outpatient Follow-up:  1. This follow-up with PCP in 1 week.  Follow-up Information    Saguier, Iris Pert. Schedule an appointment as soon as possible for a visit in 1 week(s).   Specialties:  Internal Medicine, Family Medicine Contact information: [address] [place] [address] [phone]          Diet recommendation: regular diet  Activity: The patient is advised to gradually reintroduce usual activities.  Discharge Condition: good  Code Status: full code  History of present illness: As per the H and P dictated on admission, "Hannah [name] is a 72 y.o. female with Past medical history of CAD, IDDM, right breast cancer, IDA, recurrent syncope, chronic neuropathy. Patient presented with episode of passing out. Since last 3 weeks patient has been having recurrent episodes of syncope.  Patient has sustained fracture of her left humerus associated with 1 of these episodes. Patient was recently hospitalized and discharged a few days ago. Patient was due to follow-up with neurology for 48-hour EEG monitoring. Today after going to the neurology  clinic after completing her 48-hour EEG patient went to a diner.  There patient started to become sluggish again and per her husband passed out. Before this she was complaining of having some neck pain. After the complaint of the neck pain the patient does not remember anything until the EMT arrived. Per husband patient had an episode of vomiting after passing out with greenish bile without any blood. When EMT arrived patient's blood pressure was 88/40. Patient was hooked up to the EKG with the EMT and she had another episode with the EMT and this episode was captured on EEG. Reportedly at that time her blood pressure was also in the lower side and therefore patient was referred to come to the ER for further workup"  Hospital Course:  Summary of her active problems in the hospital is as following. 1. Syncope Extensive workup has been completed for this patient including long-term EEG monitoring as well as loop recorder implant. So far the workup has been negative. Patient has been tried with midodrine in the past with increase in the blood pressure. Cortisol level in the past were low although cosyntropin stimulation test was negative for any adrenal insufficiency. Repeat cortisol stim test also negative. For definitive treatment for her syncope patient will be started on Florinef. Patient is also on tramadol which she has not taken in last 1 week. Patient is taking gabapentin for last 2-1/2 years. Also taking Claritin. At present I recommend to discontinue Claritin, also avoid using tramadol as well as reducing the gabapentin to 200 mg daily and slowly taper it off.  2.  Type 2  diabetes mellitus. Insulin-dependent. Hemoglobin A1c 7.7 in December 2018. Continue Trulicity which is due tomorrow. Continue insulin pump. Carb modified diet.  3.  breast cancer. S/P lumpectomy in 2017 as well as chemotherapy and radiation. Continue home regimen.  4.  Nausea and vomiting. One episode,  currently asymptomatic.  Monitor. Likely associated with her syncopal event.   All other chronic medical condition were stable during the hospitalization.  Patient was ambulatory without any assistance. On the day of the discharge the patient's vitals were stable, and no other acute medical condition were reported by patient. the patient was felt safe to be discharge at home with family.  Procedures and Results:  Cosyntropin stimulation test  Consultations:  none  DISCHARGE MEDICATION: Allergies as of 09/07/2017      Reactions   Amoxicillin Rash, Other (See Comments)   Has patient had a PCN reaction causing immediate rash, facial/tongue/throat swelling, SOB or lightheadedness with hypotension: No Has patient had a PCN reaction causing severe rash involving mucus membranes or skin necrosis: Yes Has patient had a PCN reaction that required hospitalization No Has patient had a PCN reaction occurring within the last 10 years: No If all of the above answers are "NO", then may proceed with Cephalosporin use.   Imdur [isosorbide Dinitrate] Other (See Comments)   Headache/severe hypotension/Syncope   Tape    Must use paper tape   Arimidex [anastrozole] Nausea Only   Latex Itching   Tetracycline Rash      Medication List    STOP taking these medications   ACCU-CHEK GUIDE test strip Generic drug:  glucose blood   loratadine 10 MG tablet Commonly known as:  CLARITIN     TAKE these medications   acetaminophen 325 MG tablet Commonly known as:  TYLENOL Take 2 tablets (650 mg total) by mouth every 6 (six) hours as needed for mild pain (or Fever >/= 101).   albuterol 108 (90 Base) MCG/ACT inhaler Commonly known as:  PROVENTIL HFA;VENTOLIN HFA Inhale 2 puffs into the lungs every 6 (six) hours as needed for wheezing or shortness of breath.   amiodarone 200 MG tablet Commonly known as:  PACERONE Take 1 tablet (200 mg total) by mouth daily.   aspirin 81 MG EC tablet Take 1 tablet  (81 mg total) by mouth daily. What changed:  when to take this   clopidogrel 75 MG tablet Commonly known as:  PLAVIX TAKE 1 TABLET BY MOUTH  DAILY What changed:    how much to take  how to take this  when to take this   Dulaglutide 1.5 MG/0.5ML Sopn Commonly known as:  TRULICITY Inject contents of pen once a week What changed:    how much to take  how to take this  when to take this  additional instructions   exemestane 25 MG tablet Commonly known as:  AROMASIN TAKE ONE TABLET BY MOUTH DAILY AFTER BREAKFAST What changed:  See the new instructions.   ezetimibe 10 MG tablet Commonly known as:  ZETIA Take 1 tablet (10 mg total) by mouth daily.   fludrocortisone 0.1 MG tablet Commonly known as:  FLORINEF Take 1 tablet (0.1 mg total) by mouth daily.   furosemide 20 MG tablet Commonly known as:  LASIX Take 1 tablet (20 mg total) by mouth daily as needed for fluid.   gabapentin 100 MG capsule Commonly known as:  NEURONTIN Take 22m bid for 2days,Take 1064mdaily for 2days,then stop. What changed:    medication strength  how much  to take  how to take this  when to take this  additional instructions   HUMALOG KWIKPEN 100 UNIT/ML KiwkPen Generic drug:  insulin lispro Inject 2-6 Units into the skin at bedtime as needed (CBG >200). What changed:  Another medication with the same name was changed. Make sure you understand how and when to take each.   insulin lispro 100 UNIT/ML injection Commonly known as:  HUMALOG Inject 76 units daily with  V-GO pump What changed:    when to take this  additional instructions   prochlorperazine 10 MG tablet Commonly known as:  COMPAZINE TAKE 1 TABLET BY MOUTH  EVERY 6 HOURS AS NEEDED FOR NAUSEA OR VOMITING What changed:  See the new instructions.   rosuvastatin 40 MG tablet Commonly known as:  CRESTOR TAKE ONE TABLET BY MOUTH DAILY What changed:    how much to take  how to take this  when to take this     SYMBICORT 160-4.5 MCG/ACT inhaler Generic drug:  budesonide-formoterol INHALE TWO PUFFS BY MOUTH TWICE DAILY   traMADol 50 MG tablet Commonly known as:  ULTRAM 1 three times a day as needed. What changed:    how much to take  how to take this  when to take this  reasons to take this  additional instructions   V-GO 30 Kit 1 Device by Does not apply route daily. What changed:  when to take this      Allergies  Allergen Reactions  . Amoxicillin Rash and Other (See Comments)    Has patient had a PCN reaction causing immediate rash, facial/tongue/throat swelling, SOB or lightheadedness with hypotension: No Has patient had a PCN reaction causing severe rash involving mucus membranes or skin necrosis: Yes Has patient had a PCN reaction that required hospitalization No Has patient had a PCN reaction occurring within the last 10 years: No If all of the above answers are "NO", then may proceed with Cephalosporin use.   . Imdur [Isosorbide Dinitrate] Other (See Comments)    Headache/severe hypotension/Syncope  . Tape     Must use paper tape  . Arimidex [Anastrozole] Nausea Only  . Latex Itching  . Tetracycline Rash   Discharge Instructions    Diet general   Complete by:  As directed    Discharge instructions   Complete by:  As directed    It is important that you read following instructions as well as go over your medication list with RN to help you understand your care after this hospitalization.  Discharge Instructions: Please follow-up with PCP in one week  Please request your primary care physician to go over all Hospital Tests and Procedure/Radiological results at the follow up,  Please get all Hospital records sent to your PCP by signing hospital release before you go home.   Do not take more than prescribed Pain, Sleep and Anxiety Medications. You were cared for by a hospitalist during your hospital stay. If you have any questions about your discharge medications  or the care you received while you were in the hospital after you are discharged, you can call the unit and ask to speak with the hospitalist on call if the hospitalist that took care of you is not available.  Once you are discharged, your primary care physician will handle any further medical issues. Please note that NO REFILLS for any discharge medications will be authorized once you are discharged, as it is imperative that you return to your primary care physician (or establish a  relationship with a primary care physician if you do not have one) for your aftercare needs so that they can reassess your need for medications and monitor your lab values. You Must read complete instructions/literature along with all the possible adverse reactions/side effects for all the Medicines you take and that have been prescribed to you. Take any new Medicines after you have completely understood and accept all the possible adverse reactions/side effects. Wear Seat belts while driving. If you have smoked or chewed Tobacco in the last 2 yrs please stop smoking and/or stop any Recreational drug use.   Increase activity slowly   Complete by:  As directed      Discharge Exam: Filed Weights   09/06/17 2051 09/07/17 0455  Weight: 87.7 kg (193 lb 6.4 oz) 87.5 kg (192 lb 14.4 oz)   Vitals:   09/07/17 0800 09/07/17 1050  BP:    Pulse:    Resp:    Temp:    SpO2: 100% 97%   General: Appear in no distress, no Rash; Oral Mucosa moist. Cardiovascular: S1 and S2 Present, no Murmur, no JVD Respiratory: Bilateral Air entry present and Clear to Auscultation, no Crackles, no wheezes Abdomen: Bowel Sound present, Soft and no tenderness Extremities: no Pedal edema, no calf tenderness Neurology: Grossly no focal neuro deficit.  The results of significant diagnostics from this hospitalization (including imaging, microbiology, ancillary and laboratory) are listed below for reference.    Significant Diagnostic Studies: Ct  Head Wo Contrast  Result Date: 09/01/2017 CLINICAL DATA:  Seizure. History of breast cancer and radiation therapy. EXAM: CT HEAD WITHOUT CONTRAST TECHNIQUE: Contiguous axial images were obtained from the base of the skull through the vertex without intravenous contrast. COMPARISON:  Brain MRI 06/29/2016 FINDINGS: Brain: No mass lesion, intraparenchymal hemorrhage or extra-axial collection. No evidence of acute cortical infarct. Normal appearance of the brain parenchyma and extra axial spaces for age. Vascular: No hyperdense vessel or unexpected vascular calcification. Skull: Normal visualized skull base, calvarium and extracranial soft tissues. Sinuses/Orbits: No sinus fluid levels or advanced mucosal thickening. No mastoid effusion. Normal orbits. IMPRESSION: Normal brain. Electronically Signed   By: Ulyses Jarred M.D.   On: 09/01/2017 22:17   Dg Chest Portable 1 View  Result Date: 09/06/2017 CLINICAL DATA:  Syncope EXAM: PORTABLE CHEST 1 VIEW COMPARISON:  06/29/2016 FINDINGS: There is no focal parenchymal opacity. There is no pleural effusion or pneumothorax. The cardiomediastinal silhouette is stable. There is evidence of prior CABG. The osseous structures are unremarkable. IMPRESSION: No active disease. Electronically Signed   By: Kathreen Devoid   On: 09/06/2017 15:58   Xr Shoulder Left  Result Date: 09/09/2017 Left shoulder pain in several projections.  The previously identified proximal humeral head neck fracture is in good position with callus  Xr Shoulder Left  Result Date: 08/26/2017 Films of the left shoulder obtained in 2 projections.  The previously identified dural head fracture is in excellent position without displacement.  Xr Shoulder Left  Result Date: 08/19/2017 ilms of the left shoulder obtained in several projections. There is essentially nondsplaced fracture of the humeral head neck junction No obvious comminution.   Microbiology: No results found for this or any previous  visit (from the past 240 hour(s)).   Labs: CBC: Recent Labs  Lab 09/03/17 0839 09/06/17 1530 09/07/17 0758  WBC 7.8 8.8 7.8  NEUTROABS  --  6.9  --   HGB 8.1* 8.7* 8.9*  HCT 25.9* 27.6* 28.7*  MCV 94.9 97.9 98.0  PLT 185  200 377   Basic Metabolic Panel: Recent Labs  Lab 09/03/17 0839 09/06/17 1530 09/07/17 0758  NA 141 138 140  K 3.4* 4.2 3.8  CL 104 106 106  CO2 _0 GLUCOSE 237* 204* 110*  BUN 44* 54* 49*  CREATININE 2.63* 2.98* 2.69*  CALCIUM 8.7* 9.1 9.0   Liver Function Tests: Recent Labs  Lab 09/06/17 1530 09/07/17 0758  AST 62* 57*  ALT 52 49  ALKPHOS 85 79  BILITOT 0.8 0.7  PROT 5.7* 5.8*  ALBUMIN 2.8* 2.9*   No results for input(s): LIPASE, AMYLASE in the last 168 hours. No results for input(s): AMMONIA in the last 168 hours. Cardiac Enzymes: No results for input(s): CKTOTAL, CKMB, CKMBINDEX, TROPONINI in the last 168 hours. BNP (last 3 results) Recent Labs    09/06/17 1530  BNP 58.0   CBG: Recent Labs  Lab 09/03/17 1120 09/06/17 1900 09/06/17 2110 09/07/17 0750 09/07/17 1211  GLUCAP 194* 151* 125* 101* 236*   Time spent: 35 minutes  Signed:  Berle Mull  [facility] 09/07/2017 , 5:52 PM

## 2017-09-09 NOTE — Progress Notes (Signed)
amb ref °

## 2017-09-09 NOTE — Progress Notes (Signed)
Office Visit Note   Patient: Hannah [name]           Date of Birth: [birth year]           MRN: [id number] Visit Date: 09/09/2017              Requested by: Mackie Pai, PA-C [address], [place] [address] PCP: Mackie Pai, PA-C   Assessment & Plan: Visit Diagnoses:  1. Acute pain of left shoulder   2. Closed fracture of head of left humerus with routine healing, subsequent encounter     Plan: 1 month status post essentially nondisplaced left humeral head fracture.  Doing well in her sling.  No numbness or tingling.  Multiple medical comorbidities including diabetes.  Films revealed good position of the fracture with callus.  We will instruct  in circumduction exercises and begin outpatient physical therapy next week in [facility].  Office 2 weeks  Follow-Up Instructions: Return in about 2 weeks (around 09/23/2017).   Orders:  Orders Placed This Encounter  Procedures  . XR Shoulder Left   No orders of the defined types were placed in this encounter.     Procedures: No procedures performed   Clinical Data: No additional findings.   Subjective: Chief Complaint  Patient presents with  . Left Shoulder - Follow-up  . Follow-up    LEFT SHOULDER/ARM F/U   Wearing shoulder immobilizer and feeling better.  1 month post humeral head fracture  HPI  Review of Systems  Constitutional: Negative for fatigue and fever.  HENT: Negative for ear pain.   Eyes: Negative for pain.  Respiratory: Negative for cough and shortness of breath.   Cardiovascular: Negative for leg swelling.  Gastrointestinal: Negative for blood in stool, constipation and diarrhea.  Genitourinary: Negative for difficulty urinating.  Musculoskeletal: Negative for back pain and neck pain.  Skin: Negative for rash and wound.  Allergic/Immunologic: Negative for food allergies.  Neurological: Negative for dizziness, weakness, light-headedness, numbness and headaches.    Hematological: Bruises/bleeds easily.  Psychiatric/Behavioral: Positive for sleep disturbance.     Objective: Vital Signs: BP (!) 127/56 (BP Location: Right Arm, Patient Position: Sitting, Cuff Size: Normal)   Pulse 94   Resp 18   Ht 5\' 5"  (1.651 m)   Wt 196 lb (88.9 kg)   BMI 32.62 kg/m   Physical Exam  Ortho Exam awake alert and oriented x3.  Comfortable sitting.  X-rays reveal good position of the fracture with callus.  Performed very gentle passive range of motion allowing about 70 degrees of flexion and abduction.  Skin intact.  Neurovascular exam intact  Specialty Comments:  No specialty comments available.  Imaging: Xr Shoulder Left  Result Date: 09/09/2017 Left shoulder pain in several projections.  The previously identified proximal humeral head neck fracture is in good position with callus    PMFS History: Patient Active Problem List   Diagnosis Date Noted  . Closed fracture of head of left humerus 09/09/2017  . Syncope 09/06/2017  . Seizure-like activity ([place]) 09/01/2017  . AKI (acute kidney injury) ([place]) 09/01/2017  . Adrenal insufficiency ([place]) 07/03/2016  . Seizure ([place]) 06/29/2016  . Chemotherapy-induced peripheral neuropathy ([place]) 04/27/2016  . 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 Osteomyelitis of toe of left foot ([place]) 04/06/2016  . Mania ([place]) 03/05/2016  . GERD (gastroesophageal reflux disease) 02/29/2016  . Chemotherapy induced nausea and vomiting 02/24/2016  . Port catheter in place 02/17/2016  . Sensorineural hearing loss (SNHL) of both  ears 01/06/2016  . Breast cancer of upper-inner quadrant of right female breast ([place]) 12/16/2015  . Osteopenia of multiple sites 10/19/2015  . CKD (chronic kidney disease), stage IV ([place]) 09/24/2013  . Chronic diastolic CHF (congestive heart failure) ([place]) 09/24/2013  . Acute on chronic combined systolic and diastolic congestive heart failure ([place]) 07/17/2013  . Atrial tachycardia ([place]) 04/03/2013   . CAD (coronary artery disease) 01/24/2011  . Chronic coronary artery disease 01/24/2011  . History of aortic valve replacement 10/27/2010  . Hyperlipidemia   . Nonischemic cardiomyopathy ([place])   . Anemia   . Type 2 diabetes mellitus ([place])   . Iron deficiency anemia   . Allergic rhinitis 10/14/2009  . Essential hypertension 05/12/2009  . H/O atrial tachycardia 05/12/2009  . Asthma 05/12/2009   Past Medical History:  Diagnosis Date  . Abnormally small mouth   . Anemia   . Bilateral carotid artery stenosis    Bilateral ICA 40-59%/  >50% LECA  . Breast cancer ([place]) 01/18/2016   right breast  . Breast cancer of upper-inner quadrant of right female breast ([place]) 12/16/2015  . CAD (coronary artery disease) cardiologist-  dr Aundra Dubin   Remote CABG in 1998 with redo in 2009  . CHF (congestive heart failure) (HCC)    EF is low normal at 50 to 55% per echo in Jan. 2011  . GERD (gastroesophageal reflux disease)   . Hearing loss of right ear   . History of non-ST elevation myocardial infarction (NSTEMI)    Sept 2014--  thought to be type II HTN w/ LHC without infarct related artery and patent grafts  . History of radiation therapy 05/24/16-07/26/16   right breast 50.4 Gy in 28 fractions, right breast boost 10 Gy in 5 fractions  . Hyperlipidemia   . Hypertension    Resolved.  . Iron deficiency anemia   . Moderate persistent asthma    pulmologist-  Dr. Malvin Johns  . Nonischemic cardiomyopathy ([place])   . PAF (paroxysmal atrial fibrillation) ([place])   . Personal history of chemotherapy 2017  . Personal history of radiation therapy 2017  . Psoriasis    right leg  . Renal calculus, right   . Renal insufficiency, mild   . S/P AVR    prosthesis valve placement 2009 at same time re-do CABG  . Stroke [facility])    residual rt hearing loss  . Type 2 diabetes mellitus ([place])    monitored by dr Dwyane Dee    Family History  Problem Relation Age of Onset  . Heart disease Father   . Heart failure Father   .  Diabetes Maternal Grandmother   . Heart disease Maternal Grandmother   . Diabetes Son   . Healthy Brother        #1  . Heart attack Brother        #2  . Heart disease Brother        #2    Past Surgical History:  Procedure Laterality Date  . AORTIC VALVE REPLACEMENT  2009   #37mm [facility] Ease pericardial valve (done same time is CABG)  . BREAST LUMPECTOMY Right 01/18/2016  . BREAST LUMPECTOMY WITH RADIOACTIVE SEED AND SENTINEL LYMPH NODE BIOPSY Right 01/18/2016   Procedure: RIGHT BREAST LUMPECTOMY WITH RADIOACTIVE SEED AND SENTINEL LYMPH NODE BIOPSY;  Surgeon: Alphonsa Overall, MD;  Location: [place];  Service: General;  Laterality: Right;  . CARDIAC CATHETERIZATION  03/23/2008   Pre-redo CABG: L main OK, LAD (T), CFX (T), OM1 99%, RCA (T),  LIMA-LAD OK, SVG-OM(?3) OK w/ little florw to OM2, SVG-RCA OK. EF NL  . CARPAL TUNNEL RELEASE    . COLONOSCOPY    . CORONARY ARTERY BYPASS GRAFT  1998 &  re-do 2009   Had LIMA to DX/LAD, SVG to 2 marginal branches and SVG to [facility] originally; SVG to 3rd OM and PD at time of redo  . CYSTOSCOPY W/ URETERAL STENT PLACEMENT Right 12/20/2014   Procedure: CYSTOSCOPY WITH RETROGRADE PYELOGRAM/URETERAL STENT PLACEMENT;  Surgeon: Cleon Gustin, MD;  [facility];  Service: Urology;  Laterality: Right;  . EYE SURGERY Bilateral    cataracts  . HOLMIUM LASER APPLICATION Right [id number]/[id number]   Procedure:  HOLMIUM LASER LITHOTRIPSY;  Surgeon: Cleon Gustin, MD;  Location: [facility];  Service: Urology;  Laterality: Right;  . LEFT HEART CATHETERIZATION WITH CORONARY/GRAFT ANGIOGRAM N/A 02/23/2013   Procedure: LEFT HEART CATHETERIZATION WITH Beatrix Fetters;  Surgeon: Blane Ohara, MD;  [facility] LAB;  Service: Cardiovascular;  Laterality: N/A;  . LOOP RECORDER INSERTION N/A 08/30/2017   Procedure: LOOP RECORDER INSERTION;  Surgeon: Evans Lance, MD;  Location: [facility] CV LAB;  Service:  Cardiovascular;  Laterality: N/A;  . PORTACATH PLACEMENT Left 01/18/2016   Procedure: INSERTION PORT-A-CATH;  Surgeon: Alphonsa Overall, MD;  Location: [place];  Service: General;  Laterality: Left;  . portacath removal    . TONSILLECTOMY    . TRANSTHORACIC ECHOCARDIOGRAM  02-24-2013      mild LVH,  ef 50-55%/  AV bioprosthesis was present with very mild stenosis and no regurg., mean grandient 56mmHg, peak grandient 80mmHg /  mild MR/  mild LAE and RAE/  moderate TR  . TUBAL LIGATION     Social History   Occupational History  . Occupation: retired  Tobacco Use  . Smoking status: Never Smoker  . Smokeless tobacco: Never Used  Substance and Sexual Activity  . Alcohol use: No  . Drug use: No  . Sexual activity: Yes    Birth control/protection: None

## 2017-09-09 NOTE — Telephone Encounter (Signed)
No answer

## 2017-09-10 ENCOUNTER — Other Ambulatory Visit: Payer: Self-pay | Admitting: Endocrinology

## 2017-09-10 DIAGNOSIS — E860 Dehydration: Secondary | ICD-10-CM

## 2017-09-10 NOTE — Telephone Encounter (Signed)
No answer

## 2017-09-11 ENCOUNTER — Encounter: Payer: Self-pay | Admitting: Medical

## 2017-09-11 ENCOUNTER — Ambulatory Visit (INDEPENDENT_AMBULATORY_CARE_PROVIDER_SITE_OTHER): Payer: Medicare Other | Admitting: Medical

## 2017-09-11 VITALS — BP 120/60 | HR 96 | Resp 16 | Ht 65.0 in | Wt 196.0 lb

## 2017-09-11 DIAGNOSIS — E108 Type 1 diabetes mellitus with unspecified complications: Secondary | ICD-10-CM | POA: Diagnosis not present

## 2017-09-11 DIAGNOSIS — N289 Disorder of kidney and ureter, unspecified: Secondary | ICD-10-CM

## 2017-09-11 DIAGNOSIS — R55 Syncope and collapse: Secondary | ICD-10-CM

## 2017-09-11 DIAGNOSIS — G4700 Insomnia, unspecified: Secondary | ICD-10-CM

## 2017-09-11 LAB — COMPREHENSIVE METABOLIC PANEL
ALT: 51 U/L — ABNORMAL HIGH (ref 0–35)
AST: 44 U/L — ABNORMAL HIGH (ref 0–37)
Albumin: 3.3 g/dL — ABNORMAL LOW (ref 3.5–5.2)
Alkaline Phosphatase: 89 U/L (ref 39–117)
BUN: 31 mg/dL — ABNORMAL HIGH (ref 6–23)
CO2: 29 mEq/L (ref 19–32)
Calcium: 9.3 mg/dL (ref 8.4–10.5)
Chloride: 108 mEq/L (ref 96–112)
Creatinine, Ser: 1.7 mg/dL — ABNORMAL HIGH (ref 0.40–1.20)
GFR: 31.4 mL/min — ABNORMAL LOW (ref >60.00)
Glucose, Bld: 129 mg/dL — ABNORMAL HIGH (ref 70–99)
Potassium: 4.9 mEq/L (ref 3.5–5.1)
Sodium: 142 mEq/L (ref 135–145)
Total Bilirubin: 0.6 mg/dL (ref 0.2–1.2)
Total Protein: 6.4 g/dL (ref 6.0–8.3)

## 2017-09-11 MED ORDER — HYDROXYZINE HCL 10 MG PO TABS: ORAL_TABLET | ORAL | 0 refills | Status: DC

## 2017-09-11 NOTE — Patient Instructions (Addendum)
  The cause of your syncopal episodes was not completely determined on workup.  Both EEG and loop recorder studies were negative.  So it appears that most likely a syncope was related to hypotension.  You are now on Florinef and I want you to check your blood pressures daily and my chart me readings in 10-14 days.  We will see if you have acceptable BP range.  You mentioned that you have some Lasix to use if needed for pedal edema.  I would be cautious in using this as this could lead to a drop in your blood pressure.  We will repeat metabolic panel today and follow your GFR.  When that study is back we will send results to your nephrologist.  For your diabetes, continue your insulin.  Today's reading was very good when you check.  Will follow sugar reading on metabolic panel as well.  For your insomnia, I did prescribe hydroxyzine low dose.  This was given as a printed prescription.  The official instruction is just to use 1 tablet at night as needed for insomnia.  You could try taking 2 tablets if you find one does not help.  Please give me update if that is helping with your sleep or not.  Follow-up in 4 weeks or as needed.

## 2017-09-11 NOTE — Progress Notes (Addendum)
Subjective:    Patient ID: Hannah [name], female    DOB: 1945/12/01, 72 y.o.   MRN: [id number]  HPI   Pt in for follow up.  Pt had syncopal episode. Pt was admitted to ED for syncope. Pt was given loop recorder and eeg were done.   Pt had some transient loss of awareness of episodes with shaking over past year. But some events described as syncope. She was admitted on 09/02/2017.   Pt had fracture 4 weeks ago proximal head of humerus fracture. As consequence of one of her syncopal episodes.  Pt during hosptialization she had gabapentin and tramadol dosing reduced to decrease chance of her syncope episodes. The thought was these meds may have caused seizure. However neurologist did eeg and no seizure activity was found.  Pt in past had low blood pressure but increased bp with midodrine. Recently they gave her florinef. Pt lowest bp was 85/36 hospital. The day she went to ED her bp was very low as well. Pt has rx of lasix if she needs for pedal edema.   Pt has blood pressure cuff at home and is accurate. Pt systolic bp at home has not bee over 100 per patient did not bring in readings.   Pt gfr has been low in past. Pt has seen nephrologist in past 2 month.  Pt blood sugar was 85 this am.. She started back on pump when got out of hospital.  Pt is not sleeping well. Since hospitalization.     Review of Systems  Constitutional: Negative for appetite change, chills, fatigue and fever.  Respiratory: Negative for cough, chest tightness, shortness of breath and wheezing.   Cardiovascular: Negative for chest pain and palpitations.  Gastrointestinal: Negative for abdominal pain.  Musculoskeletal: Negative for back pain.  Skin: Negative for rash.  Neurological: Negative for dizziness, speech difficulty, weakness and headaches.  Hematological: Negative for adenopathy. Does not bruise/bleed easily.  Psychiatric/Behavioral: Positive for sleep disturbance. Negative for behavioral problems,  confusion, dysphoric mood and suicidal ideas. The patient is not nervous/anxious.      Past Medical History:  Diagnosis Date  . Abnormally small mouth   . Anemia   . Bilateral carotid artery stenosis    Bilateral ICA 40-59%/  >50% LECA  . Breast cancer ([place]) 01/18/2016   right breast  . Breast cancer of upper-inner quadrant of right female breast ([place]) 12/16/2015  . CAD (coronary artery disease) cardiologist-  dr Aundra Dubin   Remote CABG in 1998 with redo in 2009  . CHF (congestive heart failure) (H[facility])    EF is low normal at 50 to 55% per echo in Jan. 2011  . GERD (gastroesophageal reflux disease)   . Hearing loss of right ear   . History of non-ST elevation myocardial infarction (NSTEMI)    Sept 2014--  thought to be type II HTN w/ LHC without infarct related artery and patent grafts  . History of radiation therapy 05/24/16-07/26/16   right breast 50.4 Gy in 28 fractions, right breast boost 10 Gy in 5 fractions  . Hyperlipidemia   . Hypertension    Resolved.  . Iron deficiency anemia   . Moderate persistent asthma    pulmologist-  Dr. Malvin Johns  . Nonischemic cardiomyopathy ([place])   . PAF (paroxysmal atrial fibrillation) ([place])   . Personal history of chemotherapy 2017  . Personal history of radiation therapy 2017  . Psoriasis    right leg  . Renal calculus, right   . Renal insufficiency,  mild   . S/P AVR    prosthesis valve placement 2009 at same time re-do CABG  . 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 Type 2 diabetes mellitus ([place])    monitored by dr Dwyane Dee     Social History   Socioeconomic History  . Marital status: Married    Spouse name: Not on file  . Number of children: 2  . Years of education: Not on file  . Highest education level: Not on file  Occupational History  . Occupation: retired  Scientific laboratory technician  . Financial resource strain: Not on file  . Food insecurity:    Worry: Not on file    Inability: Not on file  . Transportation needs:    Medical: Not on  file    Non-medical: Not on file  Tobacco Use  . Smoking status: Never Smoker  . Smokeless tobacco: Never Used  Substance and Sexual Activity  . Alcohol use: No  . Drug use: No  . Sexual activity: Yes    Birth control/protection: None  Lifestyle  . Physical activity:    Days per week: Not on file    Minutes per session: Not on file  . Stress: Not on file  Relationships  . Social connections:    Talks on phone: Not on file    Gets together: Not on file    Attends religious service: Not on file    Active member of club or organization: Not on file    Attends meetings of clubs or organizations: Not on file    Relationship status: Not on file  . Intimate partner violence:    Fear of current or ex partner: Not on file    Emotionally abused: Not on file    Physically abused: Not on file    Forced sexual activity: Not on file  Other Topics Concern  . Not on file  Social History Narrative  . Not on file    Past Surgical History:  Procedure Laterality Date  . AORTIC VALVE [facility]ACEMENT  [facility]   [facility]done same time is [facility])  . BREAST LUMPECTOMY Right 01/18/2016  . BREAST LUMPECTOMY WITH RADIOACTIVE SEED AND SENTINEL LYMPH NODE BIOPSY Right 01/18/2016   Procedure: RIGHT BREAST LUMPECTOMY WITH RADIOACTIVE SEED AND SENTINEL LYMPH NODE BIOPSY;  Surgeon: DAlphonsa Overall MD;  [place]  Service: General;  Laterality: Right;  . CARDIAC CATHETERIZATION  03/23/2008   Pre-redo CABG: L main OK, LAD (T), CFX (T), OM1 99%, RCA (T), LIMA-LAD OK, SVG-OM(?3) OK w/ little florw to OM2, SVG-RCA OK. EF NL  . CARPAL TUNNEL RELEASE    . COLONOSCOPY    . CORONARY ARTERY BYPASS GRAFT  1998 &  re-do 2009   Had LIMA to DX/LAD, [facility] 2 marginal [facility]; SVG to 3rd OM and PD at time of redo  . CYSTOSCOPY W/ URETERAL STENT PLACEMENT Right 12/20/2014   Procedure: CYSTOSCOPY WITH RETROGRADE PYELOGRAM/URETERAL STENT PLACEMENT;  Surgeon: PCleon Gustin MD;  [facility]  Service: Urology;  Laterality: Right;  . EYE SURGERY Bilateral    cataracts  . HOLMIUM LASER APPLICATION Right 78/18/5631  Procedure:  HOLMIUM LASER LITHOTRIPSY;  Surgeon: PCleon Gustin MD;  [facility]  Service: Urology;  Laterality: Right;  . LEFT HEART CATHETERIZATION WITH CORONARY/GRAFT ANGIOGRAM N/A 02/23/2013   Procedure: LEFT HEART CATHETERIZATION WITH CORONARY/GRAFT ANGIOGRAM;  Surgeon:  Blane Ohara, MD;  Location: [facility] CATH LAB;  Service: Cardiovascular;  Laterality: N/A;  . LOOP RECORDER INSERTION N/A 08/30/2017   Procedure: LOOP RECORDER INSERTION;  Surgeon: Evans Lance, MD;  Location: [place] CV LAB;  Service: Cardiovascular;  Laterality: N/A;  . PORTACATH PLACEMENT Left 01/18/2016   Procedure: INSERTION PORT-A-CATH;  Surgeon: Alphonsa Overall, MD;  Location: [place];  Service: General;  Laterality: Left;  . portacath removal    . TONSILLECTOMY    . TRANSTHORACIC ECHOCARDIOGRAM  02-24-2013      mild LVH,  ef 50-55%/  AV bioprosthesis was present with very mild stenosis and no regurg., mean grandient 36mHg, peak grandient 285mg /  mild MR/  mild LAE and RAE/  moderate TR  . TUBAL LIGATION      Family History  Problem Relation Age of Onset  . Heart disease Father   . Heart failure Father   . Diabetes Maternal Grandmother   . Heart disease Maternal Grandmother   . Diabetes Son   . Healthy Brother        #1  . Heart attack Brother        #2  . Heart disease Brother        #2    Allergies  Allergen Reactions  . Amoxicillin Rash and Other (See Comments)    Has patient had a PCN reaction causing immediate rash, facial/tongue/throat swelling, SOB or lightheadedness with hypotension: No Has patient had a PCN reaction causing severe rash involving mucus membranes or skin necrosis: Yes Has patient had a PCN reaction that required hospitalization No Has patient had a PCN reaction occurring  within the last 10 years: No If all of the above answers are "NO", then may proceed with Cephalosporin use.   . Imdur [Isosorbide Dinitrate] Other (See Comments)    Headache/severe hypotension/Syncope  . Tape     Must use paper tape  . Arimidex [Anastrozole] Nausea Only  . Latex Itching  . Tetracycline Rash    Current Outpatient Medications on File Prior to Visit  Medication Sig Dispense Refill  . ACCU-CHEK GUIDE test strip Use to test blood sugar 3 times daily  5  . acetaminophen (TYLENOL) 325 MG tablet Take 2 tablets (650 mg total) by mouth every 6 (six) hours as needed for mild pain (or Fever >/= 101).    . Marland Kitchenlbuterol (PROVENTIL HFA;VENTOLIN HFA) 108 (90 Base) MCG/ACT inhaler Inhale 2 puffs into the lungs every 6 (six) hours as needed for wheezing or shortness of breath.    . Marland Kitchenmiodarone (PACERONE) 200 MG tablet Take 1 tablet (200 mg total) by mouth daily. 90 tablet 3  . aspirin EC 81 MG EC tablet Take 1 tablet (81 mg total) by mouth daily. (Patient taking differently: Take 81 mg by mouth every evening. )    . clopidogrel (PLAVIX) 75 MG tablet TAKE 1 TABLET BY MOUTH  DAILY (Patient taking differently: TAKE 1 TABLET (75 MG) BY MOUTH DAILY AFTER BREAKFAST) 90 tablet 3  . Dulaglutide (TRULICITY) 1.5 MGVO/5.3GUOPN Inject contents of pen once a week (Patient taking differently: Inject 1.5 mg into the skin every Saturday. ) 12 pen 1  . exemestane (AROMASIN) 25 MG tablet TAKE ONE TABLET BY MOUTH DAILY AFTER BREAKFAST (Patient taking differently: TAKE ONE TABLET (25 MG) BY MOUTH DAILY AFTER BREAKFAST) 90 tablet 0  . ezetimibe (ZETIA) 10 MG tablet Take 1 tablet (10 mg total) by mouth daily. 90 tablet 2  . fludrocortisone (FLORINEF) 0.1 MG tablet  Take 1 tablet (0.1 mg total) by mouth daily. 30 tablet 0  . furosemide (LASIX) 20 MG tablet Take 1 tablet (20 mg total) by mouth daily as needed for fluid. 30 tablet 0  . Insulin Disposable Pump (V-GO 30) KIT 1 Device by Does not apply route daily. (Patient  taking differently: 1 Device by Does not apply route continuous. ) 90 kit 3  . insulin lispro (HUMALOG KWIKPEN) 100 UNIT/ML KiwkPen Inject 2-6 Units into the skin at bedtime as needed (CBG >200).    . insulin lispro (HUMALOG) 100 UNIT/ML injection INJECT 76 UNITS DAILY WITH  V-GO PUMP 70 mL 3  . prochlorperazine (COMPAZINE) 10 MG tablet TAKE 1 TABLET BY MOUTH  EVERY 6 HOURS AS NEEDED FOR NAUSEA OR VOMITING (Patient taking differently: TAKE 1 TABLET ( 10 MG) BY MOUTH  EVERY 6 HOURS AS NEEDED FOR NAUSEA OR VOMITING) 60 tablet 0  . rosuvastatin (CRESTOR) 40 MG tablet TAKE ONE TABLET BY MOUTH DAILY (Patient taking differently: TAKE ONE TABLET (40 MG) BY MOUTH DAILY AT BEDTIME) 90 tablet 3  . SYMBICORT 160-4.5 MCG/ACT inhaler INHALE TWO PUFFS BY MOUTH TWICE DAILY 10.2 g 2   No current facility-administered medications on file prior to visit.     BP (!) 159/60 (BP Location: Right Arm, Patient Position: Sitting, Cuff Size: Normal) Comment (BP Location): lower arm due to lymph node/cancer; broken left arm  Pulse 96   Resp 16   Ht _0  (1.651 m)   Wt 196 lb (88.9 kg)   SpO2 100%   BMI 32.62 kg/m       Objective:   Physical Exam   General Mental Status- Alert. General Appearance- Not in acute distress.   Skin General: Color- Normal Color. Moisture- Normal Moisture.  Neck Carotid Arteries- Normal color. Moisture- Normal Moisture. No carotid bruits. No JVD.  Chest and Lung Exam Auscultation: Breath Sounds:-Normal.  Cardiovascular Auscultation:Rythm- Regular. Murmurs & Other Heart Sounds:Auscultation of the heart reveals- No Murmurs.  Abdomen Inspection:-Inspeection Normal. Palpation/Percussion:Note:No mass. Palpation and Percussion of the abdomen reveal- Non Tender, Non Distended + BS, no rebound or guarding.    Neurologic Cranial Nerve exam:- CN III-XII intact(No nystagmus), symmetric smile. Strength:- 5/5 equal and symmetric strength both upper and lower extremities.       Assessment & Plan:  The cause of your syncopal episodes was not completely determined on workup.  Both EEG and loop recorder studies were negative.  So it appears that most likely a syncope was related to hypotension.  You are now on Florinef and I want you to check your blood pressures daily and my chart me readings in 10-14 days.  We will see if you have acceptable BP range.  You mentioned that you have some Lasix to use if needed for pedal edema.  I would be cautious in using this as this could lead to a drop in your blood pressure.  We will repeat metabolic panel today and follow your GFR.  When that study is back we will send results to your nephrologist.  For your diabetes, continue your insulin.  Today's reading was very good when you check.  Will follow sugar reading on metabolic panel as well.  For your insomnia, I did prescribe hydroxyzine low dose.  This was given as a printed prescription.  The official instruction is just to use 1 tablet at night as needed for insomnia.  You could try taking 2 tablets if you find one does not help.  Please give  me update if that is helping with your sleep or not.  Follow-up in 4 weeks or as needed.  Mackie Pai, PA-C

## 2017-09-12 ENCOUNTER — Ambulatory Visit (INDEPENDENT_AMBULATORY_CARE_PROVIDER_SITE_OTHER): Payer: Self-pay | Admitting: *Deleted

## 2017-09-12 DIAGNOSIS — R55 Syncope and collapse: Secondary | ICD-10-CM

## 2017-09-12 LAB — CUP PACEART INCLINIC DEVICE CHECK
Date Time Interrogation Session: 20190404153249
Implantable Pulse Generator Implant Date: 20190322

## 2017-09-12 NOTE — Progress Notes (Signed)
Wound check appointment. Steri-strips removed. Wound without redness or edema. Incision edges approximated, wound well healed. Loop check in clinic. Battery status: good. R-waves 0.81mV.  symptom episodes - no pauses seen, 0 tachy episodes, 0 pause episodes, 0 brady episodes. 0 AF episodes. Monthly summary reports and ROV with GT 6/24

## 2017-09-17 ENCOUNTER — Telehealth: Payer: Self-pay

## 2017-09-17 NOTE — Telephone Encounter (Signed)
-----   Message from Cameron Sprang, MD sent at 09/17/2017 11:00 AM EDT ----- Pls let her know the 48-hour EEG showed normal brain waves. I see that she also has the loop recorder on. So far there is no evidence of seizures as cause of her symptoms, they are most likely from low BP, continue with Cardiology follow-up. Thanks

## 2017-09-17 NOTE — Telephone Encounter (Signed)
Spoke with pt relaying message below.  She states that her PCP has told her that her symptoms are most likely due to low BP as well.  Pt requested to cancel her appointment in May.  States that she will call if she feels that she needs to be seen.

## 2017-09-17 NOTE — Procedures (Signed)
ELECTROENCEPHALOGRAM REPORT  Dates of Recording: 09/04/2017 11:47AM to 09/06/2017 12:14PM  Patient's Name: Hannah [name] MRN: [id number] Date of Birth: 01-15-1946  Referring Provider: Dr. Ellouise Newer  Procedure: 48-hour ambulatory EEG  History: This is a 72 year old woman with recurrent episodes of loss of consciousness with body jerks, leading to a fall, as well as episodes of staring, choking sensation. EEG for classification.  Medications:  PROVENTIL HFA;VENTOLIN HFA 108 (90 Base) MCG/ACT inhaler PACERONE 200 MG tablet  aspirin EC 81 MG EC tablet PLAVIX 75 MG tablet  TRULICITY 1.5 VO/5.9YT SOPN  AROMASIN 25 MG tablet  ZETIA 10 MG tablet  LASIX 20 MG tablet    NEURONTIN 300 MG capsule  Insulin Disposable Pump V-GO  HUMALOG 100 UNIT/ML injection  XYZAL 5 MG tablet COMPAZINE 10 MG tablet  CRESTOR 40 MG tablet  SYMBICORT 160-4.5 MCG/ACT inhaler  ULTRAM 50 MG tablet  Technical Summary: This is a 48-hour multichannel digital EEG recording measured by the international 10-20 system with electrodes applied with paste and impedances below 5000 ohms performed as portable with EKG monitoring.  The digital EEG was referentially recorded, reformatted, and digitally filtered in a variety of bipolar and referential montages for optimal display.    DESCRIPTION OF RECORDING: There is no clear posterior dominant rhythm. The background is symmetric. There were no epileptiform discharges or focal slowing seen in wakefulness.  During the recording, the patient progresses through wakefulness, drowsiness, and Stage 2 sleep.  Again, there were no epileptiform discharges seen.  Events: On 03/28 at 1459 hours, she has dizziness with BP 87/36. Electrographically, there were no EEG changes. Heart rate was 102 bpm, no significant change from baseline.  There were no electrographic seizures seen.  EKG lead was unremarkable.  IMPRESSION: This 48-hour ambulatory EEG study is normal.   CLINICAL  CORRELATION: A normal EEG does not exclude a clinical diagnosis of epilepsy. Episode of dizziness did not show electrographic correlate. Typical events were not captured. If further clinical questions remain, inpatient video EEG monitoring may be helpful.   Ellouise Newer, M.D.

## 2017-09-18 ENCOUNTER — Telehealth: Payer: Self-pay | Admitting: Medical

## 2017-09-18 NOTE — Telephone Encounter (Signed)
Copied from [place] [id number]. Topic: Quick Communication - See Telephone Encounter >> Sep 18, 2017 10:40 AM Boyd Kerbs wrote: CRM for notification.   Pt. Called said that doctor prescribed hydrOXYzine (ATARAX/VISTARIL) 10 MG tablet and this is not working.  She is asking if she should try Melatonin?   See Telephone encounter for: 09/18/17.

## 2017-09-18 NOTE — Telephone Encounter (Signed)
See pt. Question. Please advise.

## 2017-09-19 ENCOUNTER — Telehealth (INDEPENDENT_AMBULATORY_CARE_PROVIDER_SITE_OTHER): Payer: Self-pay | Admitting: Rheumatology

## 2017-09-19 ENCOUNTER — Telehealth (INDEPENDENT_AMBULATORY_CARE_PROVIDER_SITE_OTHER): Payer: Self-pay

## 2017-09-19 MED ORDER — MIRTAZAPINE 7.5 MG PO TABS: 7.5000 mg | ORAL_TABLET | Freq: Every day | ORAL | 0 refills | Status: DC

## 2017-09-19 NOTE — Telephone Encounter (Signed)
Pt has an appt @ [facility] this afternoon. Rx they have says ROM for left shoulder and therapist is wanting clarification to see if strengthening can be included. Please call back to advise.

## 2017-09-19 NOTE — Telephone Encounter (Signed)
Notified [facility] strengthening

## 2017-09-19 NOTE — Telephone Encounter (Signed)
Beth from [facility] left a voicemail stating her therapist wanted clarification on the PT referral for ROM for left shoulder and if strengthening would be included.  Please call [phone].

## 2017-09-19 NOTE — Telephone Encounter (Signed)
Have pt stop hydroxyzine and try remeron. I sent prescription to her pharmacy. Ask her to give update in a week if helping her sleep.

## 2017-09-19 NOTE — Telephone Encounter (Signed)
duplicate

## 2017-09-20 ENCOUNTER — Other Ambulatory Visit: Payer: Self-pay | Admitting: Hematology and Oncology

## 2017-09-20 ENCOUNTER — Encounter: Payer: Self-pay | Admitting: Medical

## 2017-09-20 NOTE — Telephone Encounter (Signed)
Patient notified and verbalized understanding. Patient is sending in BP readings through mychart today.

## 2017-09-21 NOTE — Telephone Encounter (Signed)
Reviewed bp readings and sent pt message back via my chart.

## 2017-09-23 ENCOUNTER — Ambulatory Visit (INDEPENDENT_AMBULATORY_CARE_PROVIDER_SITE_OTHER): Payer: Medicare Other | Admitting: Orthopaedic Surgery

## 2017-09-23 ENCOUNTER — Telehealth: Payer: Self-pay | Admitting: Medical

## 2017-09-23 ENCOUNTER — Encounter (INDEPENDENT_AMBULATORY_CARE_PROVIDER_SITE_OTHER): Payer: Self-pay | Admitting: Orthopaedic Surgery

## 2017-09-23 ENCOUNTER — Telehealth: Payer: Self-pay

## 2017-09-23 VITALS — BP 160/47 | HR 85 | Resp 18 | Ht 65.0 in | Wt 197.0 lb

## 2017-09-23 DIAGNOSIS — S42292D Other displaced fracture of upper end of left humerus, subsequent encounter for fracture with routine healing: Secondary | ICD-10-CM

## 2017-09-23 MED ORDER — SUVOREXANT 10 MG PO TABS: 10.0000 mg | ORAL_TABLET | Freq: Every day | ORAL | 0 refills | Status: DC

## 2017-09-23 MED ORDER — ZOLPIDEM TARTRATE 5 MG PO TABS: 5.0000 mg | ORAL_TABLET | Freq: Every evening | ORAL | 0 refills | Status: DC | PRN

## 2017-09-23 NOTE — Telephone Encounter (Signed)
PA denied. Pt must have a hx of failure, contraindication, or intolerance to both of the following:  Belsomra Rozerem

## 2017-09-23 NOTE — Progress Notes (Signed)
Office Visit Note   Patient: Hannah [name]           Date of Birth: 1945/12/23           MRN: [id number] Visit Date: 09/23/2017              Requested by: Mackie Pai, PA-C [place], [place] [address] PCP: Mackie Pai, PA-C   Assessment & Plan: Visit Diagnoses:  1. Closed fracture of head of left humerus with routine healing, subsequent encounter     Plan: 6 weeks status post essentially nondisplaced fracture of the left humeral head treated in a sling.  Presently not having much pain but certainly limited range of motion.  Only had 2 sessions of physical therapy.  Had similar fracture on the right side with some loss of motion on a chronic basis.  Patient aware of potential loss of motion on the left.  Not using a sling.  Follow-Up Instructions: Return in about 1 month (around 10/23/2017).   Orders:  No orders of the defined types were placed in this encounter.  No orders of the defined types were placed in this encounter.     Procedures: No procedures performed   Clinical Data: No additional findings.   Subjective: Chief Complaint  Patient presents with  . Left Shoulder - Follow-up  . Follow-up    l shoulder f/u just a little sore and weakness abd trouble reaching  No longer using a sling.  6 weeks post nondisplaced left humeral head fracture.  Comfortable other than some chronic loss of motion.  HPI  Review of Systems  Constitutional: Negative for fatigue.  HENT: Negative for ear pain.   Eyes: Negative for pain.  Respiratory: Negative for cough and shortness of breath.   Cardiovascular: Negative for leg swelling.  Gastrointestinal: Negative for constipation and diarrhea.  Genitourinary: Negative for difficulty urinating.  Musculoskeletal: Negative for back pain and neck pain.  Skin: Negative for rash.  Allergic/Immunologic: Negative for food allergies.  Neurological: Positive for weakness and numbness.  Hematological:  Bruises/bleeds easily.  Psychiatric/Behavioral: Positive for sleep disturbance.     Objective: Vital Signs: There were no vitals taken for this visit.  Physical Exam  Ortho Exam awake alert and oriented x3.  Comfortable sitting.  I could abduct the left arm to about 80-85 degrees and flex about 90-95 degrees.  Some pain beyond that point.  Skin intact.  Good grip and good release.  Some loss of external rotation as well.  No neck pain.  Specialty Comments:  No specialty comments available.  Imaging: No results found.   PMFS History: Patient Active Problem List   Diagnosis Date Noted  . Closed fracture of head of left humerus 09/09/2017  . Syncope 09/06/2017  . Seizure-like activity ([place]) 09/01/2017  . AKI (acute kidney injury) ([place]) 09/01/2017  . Adrenal insufficiency ([place]) 07/03/2016  . Seizure ([place]) 06/29/2016  . Chemotherapy-induced peripheral neuropathy ([place]) 04/27/2016  . 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 Osteomyelitis of toe of left foot ([place]) 04/06/2016  . Mania ([place]) 03/05/2016  . GERD (gastroesophageal reflux disease) 02/29/2016  . Chemotherapy induced nausea and vomiting 02/24/2016  . Port catheter in place 02/17/2016  . Sensorineural hearing loss (SNHL) of both ears 01/06/2016  . Breast cancer of upper-inner quadrant of right female breast ([place]) 12/16/2015  . Osteopenia of multiple sites 10/19/2015  . CKD (chronic kidney disease), stage IV ([place]) 09/24/2013  . Chronic diastolic CHF (congestive heart failure) ([place])  09/24/2013  . Acute on chronic combined systolic and diastolic congestive heart failure ([place]) 07/17/2013  . Atrial tachycardia ([place]) 04/03/2013  . CAD (coronary artery disease) 01/24/2011  . Chronic coronary artery disease 01/24/2011  . History of aortic valve replacement 10/27/2010  . Hyperlipidemia   . Nonischemic cardiomyopathy ([place])   . Anemia   . Type 2 diabetes mellitus ([place])   . Iron deficiency anemia   . Allergic rhinitis  10/14/2009  . Essential hypertension 05/12/2009  . H/O atrial tachycardia 05/12/2009  . Asthma 05/12/2009   Past Medical History:  Diagnosis Date  . Abnormally small mouth   . Anemia   . Bilateral carotid artery stenosis    Bilateral ICA 40-59%/  >50% LECA  . Breast cancer ([place]) 01/18/2016   right breast  . Breast cancer of upper-inner quadrant of right female breast ([place]) 12/16/2015  . CAD (coronary artery disease) cardiologist-  dr Aundra Dubin   Remote CABG in 1998 with redo in 2009  . CHF (congestive heart failure) (H[facility])    EF is low normal at 50 to 55% per echo in Jan. 2011  . GERD (gastroesophageal reflux disease)   . Hearing loss of right ear   . History of non-ST elevation myocardial infarction (NSTEMI)    Sept 2014--  thought to be type II HTN w/ LHC without infarct related artery and patent grafts  . History of radiation therapy 05/24/16-07/26/16   right breast 50.4 Gy in 28 fractions, right breast boost 10 Gy in 5 fractions  . Hyperlipidemia   . Hypertension    Resolved.  . Iron deficiency anemia   . Moderate persistent asthma    pulmologist-  Dr. Malvin Johns  . Nonischemic cardiomyopathy ([place])   . PAF (paroxysmal atrial fibrillation) ([place])   . Personal history of chemotherapy 2017  . Personal history of radiation therapy 2017  . Psoriasis    right leg  . Renal calculus, right   . Renal insufficiency, mild   . S/P AVR    prosthesis valve placement 2009 at same time re-do CABG  . Stroke [facility])    residual rt hearing loss  . Type 2 diabetes mellitus ([place])    monitored by dr Dwyane Dee    Family History  Problem Relation Age of Onset  . Heart disease Father   . Heart failure Father   . Diabetes Maternal Grandmother   . Heart disease Maternal Grandmother   . Diabetes Son   . Healthy Brother        #1  . Heart attack Brother        #2  . Heart disease Brother        #2    Past Surgical History:  Procedure Laterality Date  . AORTIC VALVE REPLACEMENT  2009   #60mm [facility] Ease pericardial valve (done same time is CABG)  . BREAST LUMPECTOMY Right 01/18/2016  . BREAST LUMPECTOMY WITH RADIOACTIVE SEED AND SENTINEL LYMPH NODE BIOPSY Right 01/18/2016   Procedure: RIGHT BREAST LUMPECTOMY WITH RADIOACTIVE SEED AND SENTINEL LYMPH NODE BIOPSY;  Surgeon: Alphonsa Overall, MD;  Location: [facility];  Service: General;  Laterality: Right;  . CARDIAC CATHETERIZATION  03/23/2008   Pre-redo CABG: L main OK, LAD (T), CFX (T), OM1 99%, RCA (T), LIMA-LAD OK, SVG-OM(?3) OK w/ little florw to OM2, SVG-RCA OK. EF NL  . CARPAL TUNNEL RELEASE    . COLONOSCOPY    . CORONARY ARTERY BYPASS GRAFT  1998 &  re-do 2009   Had LIMA  to DX/LAD, SVG to 2 marginal branches and SVG to [facility] originally; SVG to 3rd OM and PD at time of redo  . CYSTOSCOPY W/ URETERAL STENT PLACEMENT Right 12/20/2014   Procedure: CYSTOSCOPY WITH RETROGRADE PYELOGRAM/URETERAL STENT PLACEMENT;  Surgeon: Cleon Gustin, MD;  Location: [facility];  Service: Urology;  Laterality: Right;  . EYE SURGERY Bilateral    cataracts  . HOLMIUM LASER APPLICATION Right 1/93/7902   Procedure:  HOLMIUM LASER LITHOTRIPSY;  Surgeon: Cleon Gustin, MD;  Location[facility];  Service: Urology;  Laterality: Right;  . LEFT HEART CATHETERIZATION WITH CORONARY/GRAFT ANGIOGRAM N/A 02/23/2013   Procedure: LEFT HEART CATHETERIZATION WITH Beatrix Fetters;  Surgeon: Blane Ohara, MD;  Location: [facility] C[facility] LAB;  Service: Cardiovascular;  Laterality: N/A;  . LOOP RECORDER INSERTION N/A 08/30/2017   Procedure: LOOP RECORDER INSERTION;  Surgeon: Evans Lance, MD;  Location: [facility] LAB;  Service: Cardiovascular;  Laterality: N/A;  . PORTACATH PLACEMENT Left 01/18/2016   Procedure: INSERTION PORT-A-CATH;  Surgeon: Alphonsa Overall, MD;  Location: [facility];  Service: General;  Laterality: Left;  . portacath removal    . TONSILLECTOMY    . TRANSTHORACIC ECHOCARDIOGRAM  02-24-2013      mild LVH,  ef 50-55%/  AV  bioprosthesis was present with very mild stenosis and no regurg., mean grandient 35mmHg, peak grandient 67mmHg /  mild MR/  mild LAE and RAE/  moderate TR  . TUBAL LIGATION     Social History   Occupational History  . Occupation: retired  Tobacco Use  . Smoking status: Never Smoker  . Smokeless tobacco: Never Used  Substance and Sexual Activity  . Alcohol use: No  . Drug use: No  . Sexual activity: Yes    Birth control/protection: None

## 2017-09-23 NOTE — Telephone Encounter (Signed)
Patient failed use of hydroxyzine for insomnia and Remeron.  She sent a message on my chart stating that she is getting 3 hours of sleep at night at best.  So I did go ahead and send in generic Ambien 5 mg dose.  We will see if this helps.  Was not my first choice but in light of the above failures, I  am writing her now low dose of Ambien.

## 2017-09-23 NOTE — Telephone Encounter (Signed)
PA initiated via Covermymeds; KEY: [id number]. Awaiting determination.

## 2017-09-23 NOTE — Telephone Encounter (Signed)
Prescription of Belsomra was sent to patient's pharmacy.  Since Ambien was not covered.

## 2017-09-23 NOTE — Telephone Encounter (Signed)
Belsomra sent to pt pharmacy. Will you call pharmacy and let them know to cancel out ambien. Also let them now can give generic belsomra. In epic could not find generic form to send over?

## 2017-09-24 ENCOUNTER — Encounter: Payer: Self-pay | Admitting: Medical

## 2017-09-24 ENCOUNTER — Telehealth: Payer: Self-pay | Admitting: Medical

## 2017-09-24 NOTE — Telephone Encounter (Signed)
Had a question. Pt told me she got prescription of ambien filled. But you sent me note stating prior authorization denied. Did pharmacy fill script. Will you call pt pharmacy and ask how much it cost patient. Please let me know. How much will cost pt in future??

## 2017-09-25 NOTE — Telephone Encounter (Signed)
Sent pt back my chart message.

## 2017-09-25 NOTE — Telephone Encounter (Signed)
Pt sent mychart message

## 2017-09-26 ENCOUNTER — Ambulatory Visit: Payer: Medicare Other | Admitting: Hematology and Oncology

## 2017-09-30 NOTE — Progress Notes (Signed)
CARDIOLOGY OFFICE NOTE  Date:  10/01/2017    Hannah [name] Date of Birth: August 26, 1945 Medical Record #[id number]  PCP:  Hannah Pai, PA-C  Cardiologist:  Hannah [name]    Chief Complaint  Patient presents with  . Palpitations  . Irregular Heart Beat  . Coronary Artery Disease  . Congestive Heart Failure  . Atrial Fibrillation    Follow up visit - seen for Hannah [name]    History of Present Illness: Hannah [name] is a 72 y.o. female who presents today for a one month check. She is a former patient of Hannah [name]. Seen for Hannah [name]. She now follows with me and Hannah [name].   She has a history of CAD s/p CABG, bioprosthetic AVR, CKD, & paroxysmal atrial fibrillation.  She had her initial CABG in 1998, then had redo CABG in 2009 with bioprosthetic AVR. She does not remember when she last had documented atrial fibrillation, but she thinks it was a number of years ago, sounded peri-operative around the time of her redo CABG. She was never put on anticoagulation other than Plavix but had been on dronedarone in the past.   Over the years she has had intermittent issues. These have included tachypalpitations and chest pain. Last cath in 2014 - grafts patent. Myoview in 2015 without ischemia. Event monitor in 8/16 showed atrial tachycardia versus atypical atrial flutter. Toprol XL was increased.  She saw Dr Hannah [name] who thought that she had short runs of atrial tachycardia and not atrial flutter. Therefore, she has not been anticoagulated.   Was diagnosed with breast cancer in 2017/2018. Has had surgery. Got chemo/radiation - no Adriamycin due to her history of CHF. Her course was complicated by volume overload and anemia.  In January of 2018 began to have orthostasis. She was placed on midodrine and her lasix was changed to as needed only.ACE and beta blocker stopped. Ended up getting on amiodarone in October of 2018 for palpitations/atrial arrhythmias. I last saw her in  December - she was doing ok - BP started tracking way back up with the Midodrine and we adjusted her medicines. She had a spell of syncope earlier this year and fell - broke her shoulder and sustained a large hematoma. She now has a loop in place.   She was admitted twice in March - recurrent syncope - her loop has been ok. EEG without seizure noted. She says she was seen by cardiology - I do not see this - but she says they placed her on Florinef. She is taking. She was taken off her Ultram and Gabapentin. She is not using Lasix.   Comes in today. Here with Hannah [name] today. She is not driving. Does not really feel like driving and says no one has mentioned this. She says she is now feeling better - attributes this to the Florinef. She needs this refilled. No chest pain. No palpitations. No further syncope whatsoever. No palpitations. She is very happy that she is now doing better. She will have some swelling in her legs - goes down with elevation and overnight. Trying to use support stockings but hard to put on. Her shoulder is improving.   PMH: 1. Diabetes mellitus 2. HTN 3. Obesity 4. Atrial arrhythmias: Paroxysmal atrial fibrillation seems to have been post-operative after CAD-AVR. She has a history of amiodarone toxicity. She has not been on coumadin. 3-week event monitor (9/13) with no atrial fibrillation. 3-week event monitor (10/14) with runs of probable atrial  tachycardia. 30 day monitor in 8/16 with atrial tachycardia (multiple episodes).  5. CAD: CABG 1998 with LIMA-LAD/diagonal, sequential SVG-OM1/OM2, SVG-RCA. Redo CABG 2009 with SVG-OM3, SVG-PDA. Lexiscan myoview (9/13): EF 61%, no ischemia or infarction. LHC (10/14) with all grafts patent except the old SVG-RCA. There was 90% stenosis in the AV LCx before ungrafted MOM, but this was unchanged from prior study. Lexiscan Cardiolite (1/15) with no ischemia. She does not tolerate Imdur.  6. CKD 7. Bioprosthetic aortic valve: Echo  (2011) with EF 50-55%, bioprosthetic aortic valve well-seated. Echo (2/13) with EF 60-65%, bioprosthetic aortic valve looked ok. Echo (9/14): EF 50-55%, mild LVH, bioprosthetic AVR with mean gradient 10, mild MR, moderate TR.  8. Asthma: since her teens. 9. Low back pain.  10. 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 Per neurologist, did not appear cardioembolic.  11. Carotid stenosis: 40-59% bilateral ICA stenosis on 10/14 carotid dopplers. Carotid doppler (11/15) with 40-59% bilateral ICA stenosis. Carotid dopplers (11/16) with 40-59% BICA stenosis.  12. Sleep study 12/14 without significant OSA.  13. Chronic diastolic CHF    Past Medical History:  Diagnosis Date  . Abnormally small mouth   . Anemia   . Bilateral carotid artery stenosis    Bilateral ICA 40-59%/  >50% LECA  . Breast cancer ([place]) 01/18/2016   right breast  . Breast cancer of upper-inner quadrant of right female breast ([place]) 12/16/2015  . CAD (coronary artery disease) cardiologist-  dr Aundra [name]   Remote CABG in 1998 with redo in 2009  . CHF (congestive heart failure) (H[facility])    EF is low normal at 50 to 55% per echo in Jan. 2011  . GERD (gastroesophageal reflux disease)   . Hearing loss of right ear   . History of non-ST elevation myocardial infarction (NSTEMI)    Sept 2014--  thought to be type II HTN w/ LHC without infarct related artery and patent grafts  . History of radiation therapy 05/24/16-07/26/16   right breast 50.4 Gy in 28 fractions, right breast boost 10 Gy in 5 fractions  . Hyperlipidemia   . Hypertension    Resolved.  . Iron deficiency anemia   . Moderate persistent asthma    pulmologist-  Dr. Malvin Johns  . Nonischemic cardiomyopathy ([place])   . PAF (paroxysmal atrial fibrillation) ([place])   . Personal history of chemotherapy 2017  . Personal history of radiation therapy 2017  . Psoriasis    right leg  . Renal calculus, right   . Renal insufficiency, mild   . S/P AVR    prosthesis valve placement  2009 at same time re-do CABG  . Stroke [facility])    residual rt hearing loss  . Type 2 diabetes mellitus ([place])    monitored by dr Dwyane Dee    Past Surgical History:  Procedure Laterality Date  . [facility]IC [facility]  [facility]   [facility] is [facility]  . BREAST LUMPECTOMY Right 01/18/2016  . BREAST LUMPECTOMY WITH RADIOACTIVE SEED AND SENTINEL LYMPH NODE BIOPSY Right 01/18/2016   Procedure: RIGHT BREAST LUMPECTOMY WITH RADIOACTIVE SEED AND SENTINEL LYMPH NODE BIOPSY;  Surgeon: DAlphonsa Overall MD;  [facility]  Service: General;  Laterality: Right;  . CARDIAC CATHETERIZATION  03/23/2008   Pre-redo CABG: L main OK, LAD (T), CFX (T), OM1 99%, RCA (T), LIMA-LAD OK, SVG-OM(?3) OK w/ little florw to OM2, SVG-RCA OK. EF NL  . CARPAL TUNNEL RELEASE    . COLONOSCOPY    .  CORONARY ARTERY BYPASS GRAFT  1998 &  re-do 2009   Had LIMA to DX/LAD, SVG to 2 marginal branches and SVG to [facility] originally; SVG to 3rd OM and PD at time of redo  . CYSTOSCOPY W/ URETERAL STENT PLACEMENT Right 12/20/2014   Procedure: CYSTOSCOPY WITH RETROGRADE PYELOGRAM/URETERAL STENT PLACEMENT;  Surgeon: Cleon Gustin, MD;  Location: [facility];  Service: Urology;  Laterality: Right;  . EYE SURGERY Bilateral    cataracts  . HOLMIUM LASER APPLICATION Right 9/48/0165   Procedure:  HOLMIUM LASER LITHOTRIPSY;  Surgeon: Cleon Gustin, MD;  Location[facility];  Service: Urology;  Laterality: Right;  . LEFT HEART CATHETERIZATION WITH CORONARY/GRAFT ANGIOGRAM N/A 02/23/2013   Procedure: LEFT HEART CATHETERIZATION WITH Beatrix Fetters;  Surgeon: Blane Ohara, MD;  Location: [facility] CATH LAB;  Service: Cardiovascular;  Laterality: N/A;  . LOOP RECORDER INSERTION N/A 08/30/2017   Procedure: LOOP RECORDER INSERTION;  Surgeon: Evans Lance, MD;  Location: [place] CV LAB;  Service: Cardiovascular;  Laterality: N/A;  . PORTACATH PLACEMENT Left  01/18/2016   Procedure: INSERTION PORT-A-CATH;  Surgeon: Alphonsa Overall, MD;  Location: [place];  Service: General;  Laterality: Left;  . portacath removal    . TONSILLECTOMY    . TRANSTHORACIC ECHOCARDIOGRAM  02-24-2013      mild LVH,  ef 50-55%/  AV bioprosthesis was present with very mild stenosis and no regurg., mean grandient 23mHg, peak grandient 23mg /  mild MR/  mild LAE and RAE/  moderate TR  . TUBAL LIGATION       Medications: Current Meds  Medication Sig  . ACCU-CHEK GUIDE test strip Use to test blood sugar 3 times daily  . acetaminophen (TYLENOL) 325 MG tablet Take 2 tablets (650 mg total) by mouth every 6 (six) hours as needed for mild pain (or Fever >/= 101).  . Marland Kitchenlbuterol (PROVENTIL HFA;VENTOLIN HFA) 108 (90 Base) MCG/ACT inhaler Inhale 2 puffs into the lungs every 6 (six) hours as needed for wheezing or shortness of breath.  . Marland Kitchenmiodarone (PACERONE) 200 MG tablet Take 1 tablet (200 mg total) by mouth daily.  . Marland Kitchenspirin EC 81 MG EC tablet Take 1 tablet (81 mg total) by mouth daily. (Patient taking differently: Take 81 mg by mouth every evening. )  . clopidogrel (PLAVIX) 75 MG tablet TAKE 1 TABLET BY MOUTH  DAILY (Patient taking differently: TAKE 1 TABLET (75 MG) BY MOUTH DAILY AFTER BREAKFAST)  . Dulaglutide (TRULICITY) 1.5 MGVV/7.4MOOPN Inject contents of pen once a week (Patient taking differently: Inject 1.5 mg into the skin every Saturday. )  . exemestane (AROMASIN) 25 MG tablet TAKE ONE TABLET BY MOUTH DAILY AFTER BREAKFAST  . ezetimibe (ZETIA) 10 MG tablet Take 1 tablet (10 mg total) by mouth daily.  . fludrocortisone (FLORINEF) 0.1 MG tablet Take 1 tablet (0.1 mg total) by mouth daily.  . furosemide (LASIX) 20 MG tablet Take 1 tablet (20 mg total) by mouth daily as needed for fluid.  . Insulin Disposable Pump (V-GO 30) KIT 1 Device by Does not apply route daily. (Patient taking differently: 1 Device by Does not apply route continuous. )  . insulin lispro (HUMALOG KWIKPEN) 100  UNIT/ML KiwkPen Inject 2-6 Units into the skin at bedtime as needed (CBG >200).  . insulin lispro (HUMALOG) 100 UNIT/ML injection INJECT 76 UNITS DAILY WITH  V-GO PUMP  . prochlorperazine (COMPAZINE) 10 MG tablet TAKE 1 TABLET BY MOUTH  EVERY 6 HOURS AS NEEDED FOR  NAUSEA OR VOMITING (Patient taking differently: TAKE 1 TABLET ( 10 MG) BY MOUTH  EVERY 6 HOURS AS NEEDED FOR NAUSEA OR VOMITING)  . rosuvastatin (CRESTOR) 40 MG tablet TAKE ONE TABLET BY MOUTH DAILY (Patient taking differently: TAKE ONE TABLET (40 MG) BY MOUTH DAILY AT BEDTIME)  . Suvorexant (BELSOMRA) 10 MG TABS Take 10 mg by mouth at bedtime.  . SYMBICORT 160-4.5 MCG/ACT inhaler INHALE TWO PUFFS BY MOUTH TWICE DAILY  . zolpidem (AMBIEN) 5 MG tablet Take 1 tablet (5 mg total) by mouth at bedtime as needed for sleep.     Allergies: Allergies  Allergen Reactions  . Amoxicillin Rash and Other (See Comments)    Has patient had a PCN reaction causing immediate rash, facial/tongue/throat swelling, SOB or lightheadedness with hypotension: No Has patient had a PCN reaction causing severe rash involving mucus membranes or skin necrosis: Yes Has patient had a PCN reaction that required hospitalization No Has patient had a PCN reaction occurring within the last 10 years: No If all of the above answers are "NO", then may proceed with Cephalosporin use.   . Imdur [Isosorbide Dinitrate] Other (See Comments)    Headache/severe hypotension/Syncope  . Tape     Must use paper tape  . Arimidex [Anastrozole] Nausea Only  . Latex Itching  . Tetracycline Rash    Social History: The patient  reports that she has never smoked. She has never used smokeless tobacco. She reports that she does not drink alcohol or use drugs.   Family History: The patient's family history includes Diabetes in her maternal grandmother and son; Healthy in her brother; Heart attack in her brother; Heart disease in her brother, father, and maternal grandmother; Heart  failure in her father.   Review of Systems: Please see the history of present illness.   Otherwise, the review of systems is positive for none.   All other systems are reviewed and negative.   Physical Exam: VS:  BP 122/70 (BP Location: Left Arm, Patient Position: Sitting, Cuff Size: Normal)   Pulse 86   Ht 5' 5"  (1.651 m)   Wt 197 lb 12.8 oz (89.7 kg)   BMI 32.92 kg/m  .  BMI Body mass index is 32.92 kg/m.  Wt Readings from Last 3 Encounters:  10/01/17 197 lb 12.8 oz (89.7 kg)  09/23/17 197 lb (89.4 kg)  09/11/17 196 lb (88.9 kg)    General: Pleasant. Well developed, well nourished and in no acute distress. She looks good today.    HEENT: Normal.  Neck: Supple, no JVD, carotid bruits, or masses noted.  Cardiac: Regular rate and rhythm. No edema.  Respiratory:  Lungs are clear to auscultation bilaterally with normal work of breathing.  GI: Soft and nontender.  MS: No deformity or atrophy. Gait and ROM intact.  Skin: Warm and dry. Color is normal.  Neuro:  Strength and sensation are intact and no gross focal deficits noted.  Psych: Alert, appropriate and with normal affect.   LABORATORY DATA:  EKG:  EKG is ordered today. This demonstrates sinus rhythm today.  Lab Results  Component Value Date   WBC 7.8 09/07/2017   HGB 8.9 (L) 09/07/2017   HCT 28.7 (L) 09/07/2017   PLT 200 09/07/2017   GLUCOSE 129 (H) 09/11/2017   CHOL 109 08/01/2017   TRIG 90.0 08/01/2017   HDL 42.30 08/01/2017   LDLDIRECT 53.0 10/08/2016   LDLCALC 48 08/01/2017   ALT 51 (H) 09/11/2017   AST 44 (H) 09/11/2017  NA 142 09/11/2017   K 4.9 09/11/2017   CL 108 09/11/2017   CREATININE 1.70 (H) 09/11/2017   BUN 31 (H) 09/11/2017   CO2 29 09/11/2017   TSH 2.190 05/15/2017   INR 1.06 06/15/2013   HGBA1C 7.7 (H) 05/16/2017   MICROALBUR 13.3 (H) 02/14/2017     BNP (last 3 results) Recent Labs    09/06/17 1530  BNP 58.0    ProBNP (last 3 results) No results for input(s): PROBNP in the last  8760 hours.   Other Studies Reviewed Today:  Echo Study Conclusions December 2018  - Left ventricle: The cavity size was normal. Wall thickness was   increased in a pattern of severe LVH. Systolic function was   normal. The estimated ejection fraction was in the range of 60%   to 65%. - Aortic valve: A bioprosthesis was present.   MYOVIEW FINDINGS FROM 06/2013: Normal resting EKG. Slight ST depressions noted in aVL after administration of lexiscan. Mild shortness of breath and nausea resolved after the test. EKG is nondiagnostic for ischemia. TID ratio 1.05. Lung-heart ratio 0.43. Normal ventricular chamber size.  IMPRESSION: No evidence for ischemia. Normal wall motion. LVEF 72%.   Electronically Signed By: Guy Sandifer On: 06/15/2013 14:27    Assessment/Plan:  1. Unexplained syncope - the etiology of her symptoms has been unclear. She has had recent multiple spells with injury. No seizures noted. Now with loop in place. On Florinef and has done well. Would hold on the driving.   2. History of atrial tach - remains on low dose amio. Has loop in place as well.   3. History of orthostasis/syncope - now on low dose Florinef - doing well - no recurrence now in almost one month.   4. CAD - prior CABG - no active chest pain. Would follow. Her last LHC showed stable anatomy with patent grafts except for SVG-RCA from older CABG.   5. Chronic diastolic HF -looks very compensated.Not using prn Lasix. Elevating her legs with good results for dealing with swelling.   6. Anemia - lab today - this has been longstanding - ?worse due to CKD.  7. CKD - followed by nephrology - Dr. Posey Pronto  8. Breast cancer- she is on Arimidex. Followed by oncology. Not discussed today.    Current medicines are reviewed with the patient today.  The patient does not have concerns regarding medicines other than what has been noted above.  The following changes have been made:  See  above.  Labs/ tests ordered today include:    Orders Placed This Encounter  Procedures  . Basic metabolic panel  . CBC  . Hepatic function panel  . EKG 12-Lead     Disposition:   FU with Hannah [name] as planned. I will see in August. Lab today.  Patient is agreeable to this plan and will call if any problems develop in the interim.   SignedTruitt Merle, NP  10/01/2017 10:56 AM  [facility] [address] [place], [place]  [address] Phone: [phone] Fax: [phone]

## 2017-10-01 ENCOUNTER — Ambulatory Visit (INDEPENDENT_AMBULATORY_CARE_PROVIDER_SITE_OTHER): Payer: Medicare Other | Admitting: Nurse Practitioner

## 2017-10-01 ENCOUNTER — Encounter: Payer: Self-pay | Admitting: Nurse Practitioner

## 2017-10-01 ENCOUNTER — Other Ambulatory Visit: Payer: Medicare Other

## 2017-10-01 ENCOUNTER — Telehealth: Payer: Self-pay | Admitting: Endocrinology

## 2017-10-01 VITALS — BP 122/70 | HR 86 | Ht 65.0 in | Wt 197.8 lb

## 2017-10-01 DIAGNOSIS — I2510 Atherosclerotic heart disease of native coronary artery without angina pectoris: Secondary | ICD-10-CM

## 2017-10-01 DIAGNOSIS — R55 Syncope and collapse: Secondary | ICD-10-CM | POA: Diagnosis not present

## 2017-10-01 DIAGNOSIS — I471 Supraventricular tachycardia: Secondary | ICD-10-CM

## 2017-10-01 LAB — CBC
Hematocrit: 28.3 % — ABNORMAL LOW (ref 34.0–46.6)
Hemoglobin: 9.1 g/dL — ABNORMAL LOW (ref 11.1–15.9)
MCH: 31.3 pg (ref 26.6–33.0)
MCHC: 32.2 g/dL (ref 31.5–35.7)
MCV: 97 fL (ref 79–97)
Platelets: 146 10*3/uL — ABNORMAL LOW (ref 150–379)
RBC: 2.91 x10E6/uL — ABNORMAL LOW (ref 3.77–5.28)
RDW: 13.8 % (ref 12.3–15.4)
WBC: 6.1 10*3/uL (ref 3.4–10.8)

## 2017-10-01 LAB — HEPATIC FUNCTION PANEL
ALT: 42 IU/L — ABNORMAL HIGH (ref 0–32)
AST: 35 IU/L (ref 0–40)
Albumin: 3.7 g/dL (ref 3.5–4.8)
Alkaline Phosphatase: 75 IU/L (ref 39–117)
Bilirubin Total: 0.5 mg/dL (ref 0.0–1.2)
Bilirubin, Direct: 0.19 mg/dL (ref 0.00–0.40)
Total Protein: 5.9 g/dL — ABNORMAL LOW (ref 6.0–8.5)

## 2017-10-01 LAB — BASIC METABOLIC PANEL
BUN/Creatinine Ratio: 15 (ref 12–28)
BUN: 25 mg/dL (ref 8–27)
CO2: 25 mmol/L (ref 20–29)
Calcium: 9.1 mg/dL (ref 8.7–10.3)
Chloride: 104 mmol/L (ref 96–106)
Creatinine, Ser: 1.67 mg/dL — ABNORMAL HIGH (ref 0.57–1.00)
GFR calc Af Amer: 35 mL/min/{1.73_m2} — ABNORMAL LOW (ref >59)
GFR calc non Af Amer: 30 mL/min/{1.73_m2} — ABNORMAL LOW (ref >59)
Glucose: 220 mg/dL — ABNORMAL HIGH (ref 65–99)
Potassium: 4.2 mmol/L (ref 3.5–5.2)
Sodium: 141 mmol/L (ref 134–144)

## 2017-10-01 MED ORDER — FLUDROCORTISONE ACETATE 0.1 MG PO TABS: 0.1000 mg | ORAL_TABLET | Freq: Every day | ORAL | 3 refills | Status: DC

## 2017-10-01 NOTE — Telephone Encounter (Signed)
Pt called and notified of MD message. Pt states that she will call cardiologist and see if this can be done.

## 2017-10-01 NOTE — Telephone Encounter (Signed)
Patient want to know if Dr Dwyane Dee can add on his labs, to the labs she got drawn today at her heart doctor, please advise  She do not want to get stuck twice.

## 2017-10-01 NOTE — Telephone Encounter (Signed)
Please advise 

## 2017-10-01 NOTE — Patient Instructions (Addendum)
We will be checking the following labs today - BMET, CBC, HPF    Medication Instructions:    Continue with your current medicines.     Testing/Procedures To Be Arranged:  N/A  Follow-Up:   See Dr. Lovena Le in May  See me in August    Other Special Instructions:   Keep a check on your BP and HR for [place]    If you need a refill on your cardiac medications before your next appointment, please call your pharmacy.   Call the [place] office at [phone] if you have any questions, problems or concerns.

## 2017-10-01 NOTE — Telephone Encounter (Signed)
She needs to talk to the cardiologist office and they can draw the same labs that are in epic, preferred that we send them to [facility] instead of [facility]

## 2017-10-02 ENCOUNTER — Ambulatory Visit (INDEPENDENT_AMBULATORY_CARE_PROVIDER_SITE_OTHER): Payer: Medicare Other | Admitting: *Deleted

## 2017-10-02 ENCOUNTER — Encounter: Payer: Self-pay | Admitting: Medical

## 2017-10-02 ENCOUNTER — Telehealth: Payer: Self-pay | Admitting: Medical

## 2017-10-02 DIAGNOSIS — R55 Syncope and collapse: Secondary | ICD-10-CM | POA: Diagnosis not present

## 2017-10-02 MED ORDER — ZOLPIDEM TARTRATE 5 MG PO TABS: ORAL_TABLET | ORAL | 0 refills | Status: DC

## 2017-10-02 NOTE — Progress Notes (Signed)
[facility] Summary Report / Loop Recorder 

## 2017-10-02 NOTE — Telephone Encounter (Signed)
Refilled pt ambien. But post dated rx. Since too early or refill. Pt had my chart refill request.

## 2017-10-03 ENCOUNTER — Ambulatory Visit: Payer: Medicare Other | Admitting: Endocrinology

## 2017-10-05 IMAGING — CR DG CHEST 1V PORT
1 series · 1 of 1 positions shown · non-contrast
Comparison: 06/14/2013 chest x-ray.

CLINICAL DATA: 70-year-old female post Port-A-Cath insertion on the
left. Initial encounter.

EXAM:
PORTABLE CHEST 1 VIEW

[AP]
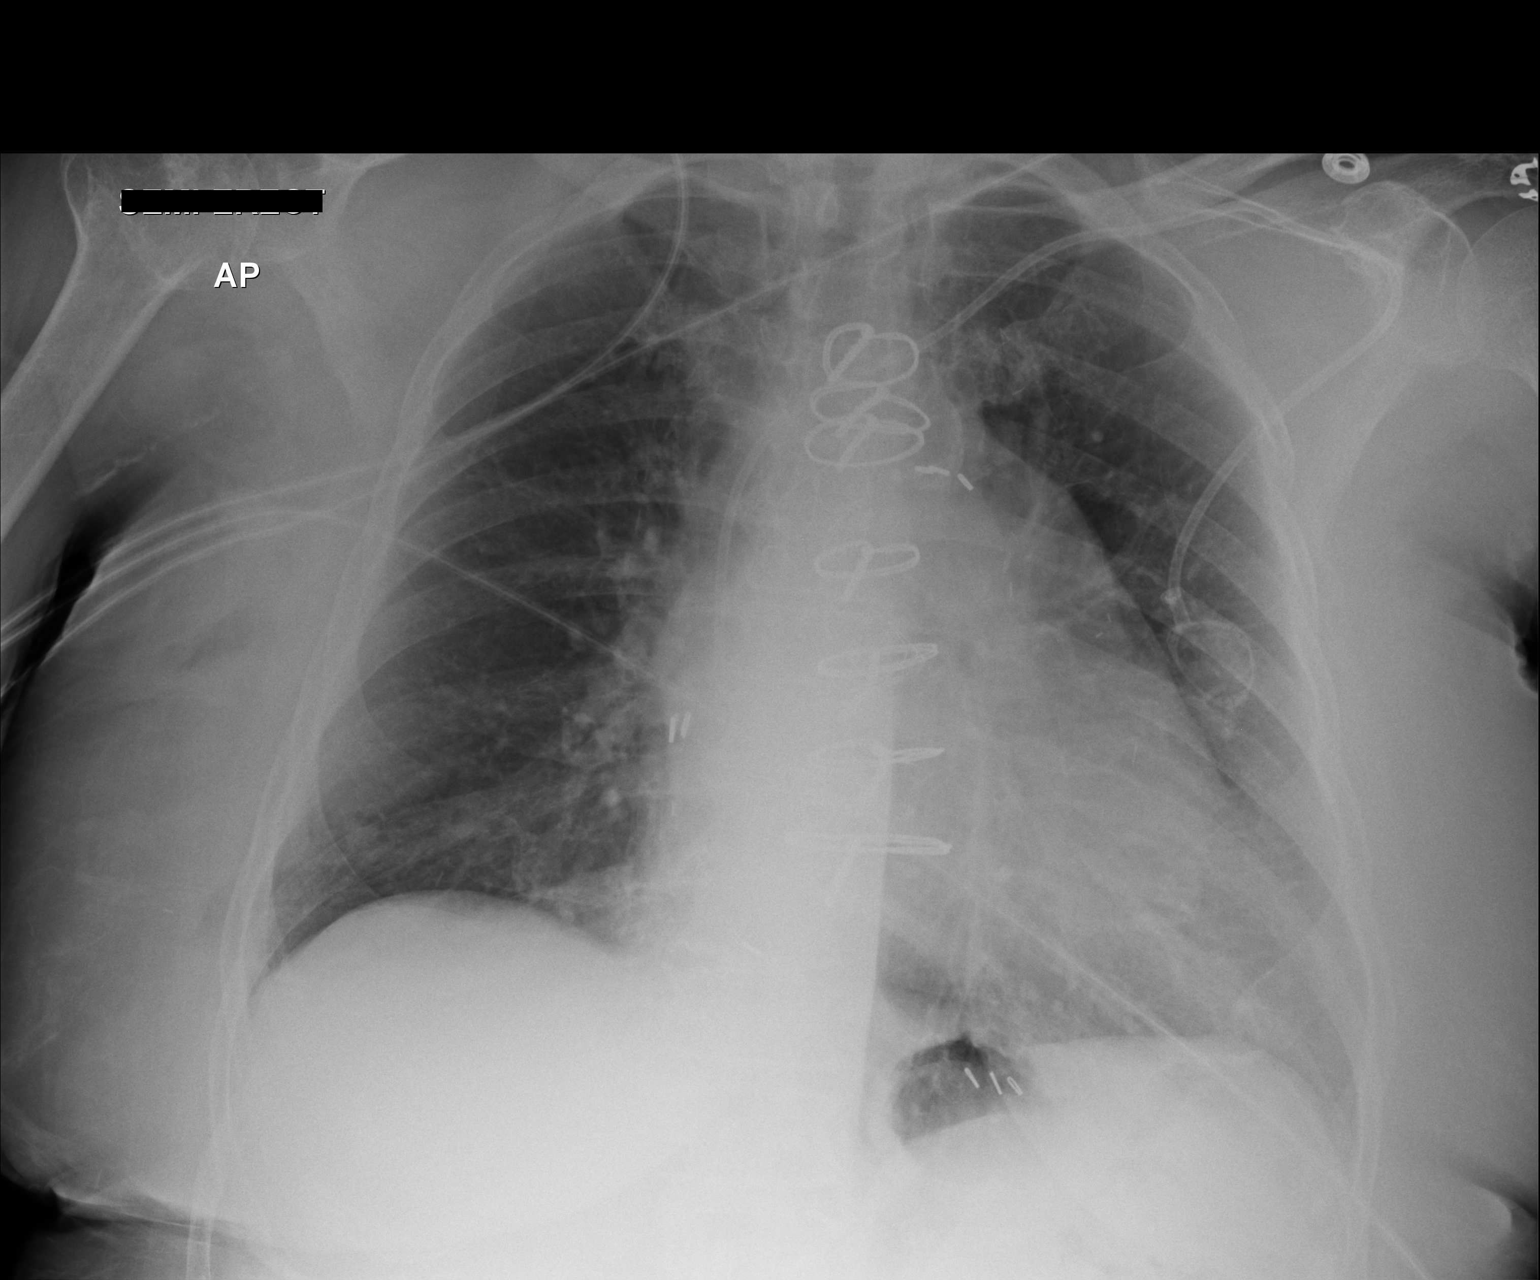

[1 of 1 positions shown; findings below may reference images not displayed]

FINDINGS: Left Port-A-Cath has been placed from subclavian approach with the
tip at the level of the distal superior vena cava without evidence
for pneumothorax.

Post CABG with slight prominence of the mediastinum and cardiac
silhouette. This may be related to patient's positioning and
portable technique although difficult to exclude abnormality at the
aortic pulmonary window level. This can be assessed on follow-up
two-view chest.

Degenerative changes right shoulder.

Calcified aorta.
IMPRESSION: Left Port-A-Cath has been placed from subclavian approach with the
tip at the level of the distal superior vena cava without evidence
for pneumothorax.

Post CABG with slight prominence of the mediastinum and cardiac
silhouette. This may be related to patient's positioning and
portable technique although difficult to exclude abnormality at the
aortic pulmonary window level. This can be assessed on follow-up
two-view chest.

Aortic atherosclerosis.

## 2017-10-05 IMAGING — MG DIAGNOSTIC NEEDLE LOC MAMMO RIGHT
1 series · 1 of 1 positions shown · non-contrast
Comparison: Previous exam(s).

CLINICAL DATA: 70-year-old female with recently diagnosed invasive
right breast cancer post ultrasound-guided biopsy of a mass in the
right breast at the [DATE] position and of a small adjacent satellite
nodule. Radioactive seed will be placed between the 2 clips/masses.

EXAM:
MAMMOGRAPHIC GUIDED RADIOACTIVE SEED LOCALIZATION OF THE RIGHT
BREAST

[R]
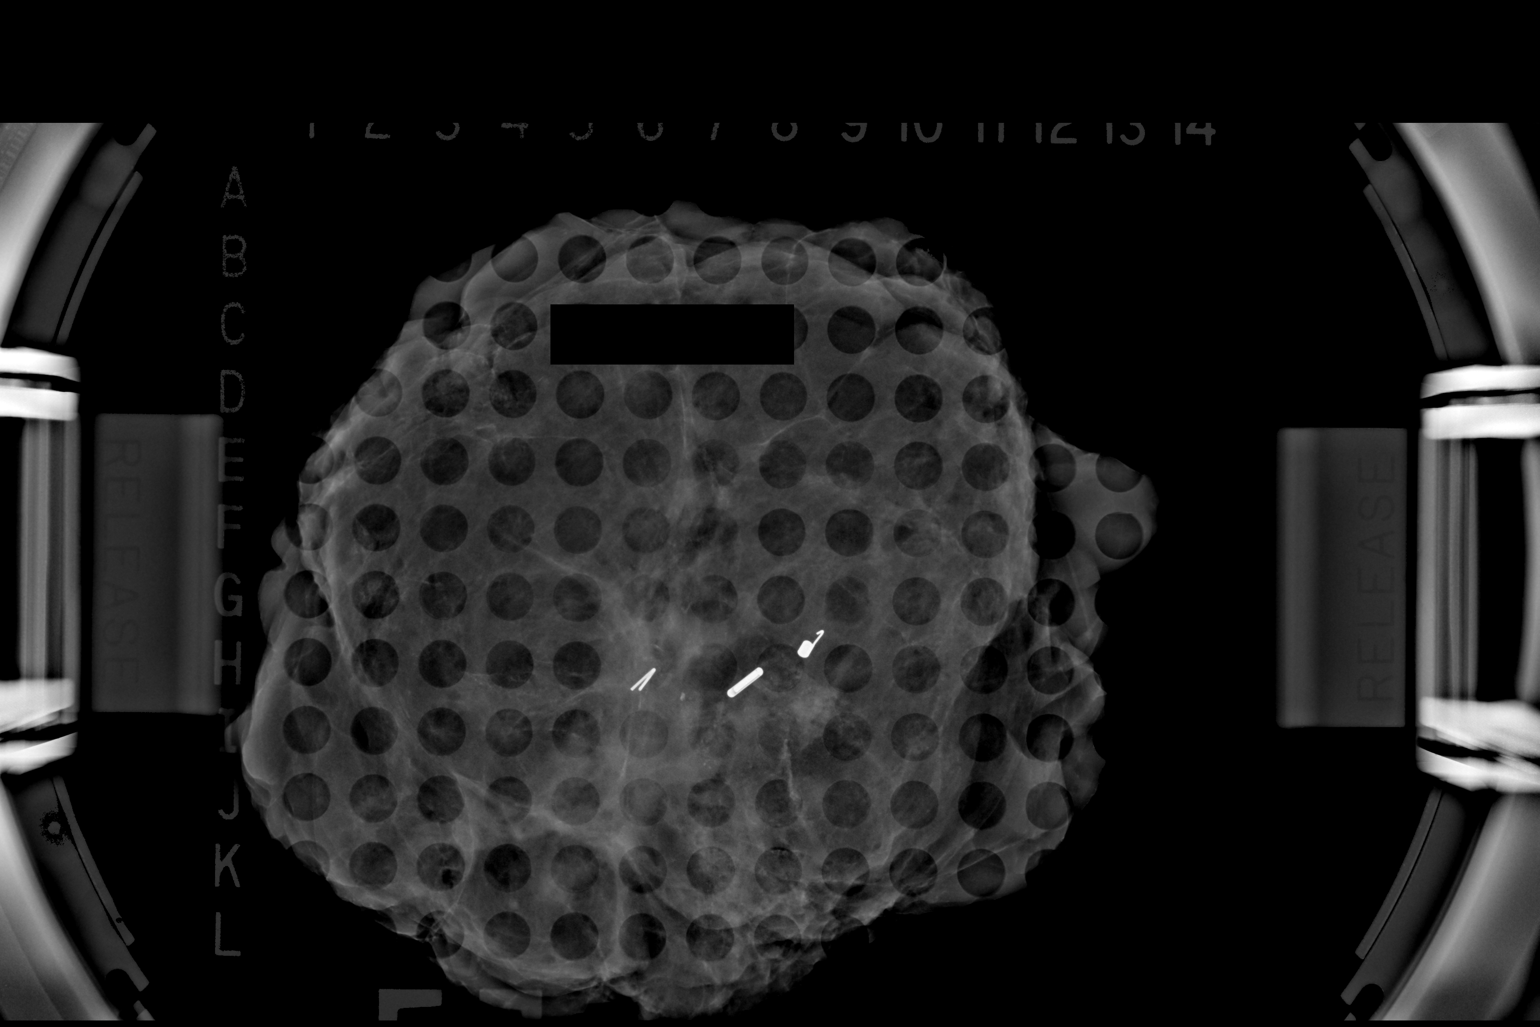

[1 of 1 positions shown; findings below may reference images not displayed]

FINDINGS: Patient presents for radioactive seed localization prior to right
breast lumpectomy. I met with the patient and we discussed the
procedure of seed localization including benefits and alternatives.
We discussed the high likelihood of a successful procedure. We
discussed the risks of the procedure including infection, bleeding,
tissue injury and further surgery. We discussed the low dose of
radioactivity involved in the procedure. Informed, written consent
was given.

The usual time-out protocol was performed immediately prior to the
procedure.

Using mammographic guidance, sterile technique, 1% lidocaine and an
[id number] radioactive seed, the biopsy marking clips with associated
masses were localized using a superior to inferior approach. The
follow-up mammogram images confirm the seed in the expected location
and were marked for Dr. Bout.

Follow-up survey of the patient confirms presence of the radioactive
seed.

Order number of [id number] seed:  [id number].

Total activity:  0.253 millicuries  Reference Date: 01/06/2016

The patient tolerated the procedure well and was released from the
[REDACTED]. She was given instructions regarding seed removal.
IMPRESSION: Radioactive seed localization right breast. The radioactive seed is
positioned between the two biopsy marking clips at the sites of
biopsy proven malignancy.

## 2017-10-07 ENCOUNTER — Other Ambulatory Visit: Payer: Self-pay | Admitting: Medical

## 2017-10-07 DIAGNOSIS — I6523 Occlusion and stenosis of bilateral carotid arteries: Secondary | ICD-10-CM

## 2017-10-10 ENCOUNTER — Other Ambulatory Visit: Payer: Medicare Other

## 2017-10-11 ENCOUNTER — Ambulatory Visit (HOSPITAL_COMMUNITY)
Admission: RE | Admit: 2017-10-11 | Discharge: 2017-10-11 | Disposition: A | Discharge status: Home / Self Care | Payer: Medicare Other | Source: Ambulatory Visit | Attending: Cardiovascular Disease | Admitting: Cardiovascular Disease

## 2017-10-11 ENCOUNTER — Other Ambulatory Visit (INDEPENDENT_AMBULATORY_CARE_PROVIDER_SITE_OTHER): Payer: Medicare Other

## 2017-10-11 DIAGNOSIS — E1165 Type 2 diabetes mellitus with hyperglycemia: Secondary | ICD-10-CM | POA: Diagnosis not present

## 2017-10-11 DIAGNOSIS — Z794 Long term (current) use of insulin: Secondary | ICD-10-CM

## 2017-10-11 DIAGNOSIS — I6523 Occlusion and stenosis of bilateral carotid arteries: Secondary | ICD-10-CM | POA: Insufficient documentation

## 2017-10-11 LAB — GLUCOSE, RANDOM: Glucose, Bld: 141 mg/dL — ABNORMAL HIGH (ref 70–99)

## 2017-10-12 LAB — FRUCTOSAMINE: Fructosamine: 276 umol/L (ref 0–285)

## 2017-10-17 ENCOUNTER — Other Ambulatory Visit: Payer: Self-pay | Admitting: Internal Medicine

## 2017-10-17 ENCOUNTER — Ambulatory Visit: Payer: Medicare Other | Admitting: Neurology

## 2017-10-17 ENCOUNTER — Ambulatory Visit: Payer: Medicare Other | Admitting: Endocrinology

## 2017-10-28 ENCOUNTER — Ambulatory Visit (INDEPENDENT_AMBULATORY_CARE_PROVIDER_SITE_OTHER): Payer: Medicare Other | Admitting: Orthopaedic Surgery

## 2017-10-28 ENCOUNTER — Other Ambulatory Visit: Payer: Self-pay | Admitting: Internal Medicine

## 2017-10-28 LAB — CUP PACEART REMOTE DEVICE CHECK
Date Time Interrogation Session: 20190424124210
Implantable Pulse Generator Implant Date: 20190322

## 2017-10-30 ENCOUNTER — Encounter: Payer: Self-pay | Admitting: Medical

## 2017-10-30 ENCOUNTER — Telehealth: Payer: Self-pay | Admitting: Medical

## 2017-10-30 MED ORDER — ZOLPIDEM TARTRATE 5 MG PO TABS: ORAL_TABLET | ORAL | 0 refills | Status: DC

## 2017-10-30 NOTE — Telephone Encounter (Signed)
Will you get ambien rx of printer. Fax this prescription in. Special instruction on filling early since they are closed on Monday.

## 2017-10-31 ENCOUNTER — Telehealth: Payer: Self-pay | Admitting: Medical

## 2017-10-31 NOTE — Telephone Encounter (Signed)
Can you call pharmacy and see if they can fil her Hannah [name] on 25th rather than 26th. Pharmacy closed this Sunday and Monday. Pt afraid to run out of med. I sent rx for 26th but did not know pharmacy was closed on Sunday.

## 2017-10-31 NOTE — Telephone Encounter (Signed)
Rx sent to pharmacy   

## 2017-11-01 ENCOUNTER — Encounter (INDEPENDENT_AMBULATORY_CARE_PROVIDER_SITE_OTHER): Payer: Self-pay | Admitting: Orthopaedic Surgery

## 2017-11-01 ENCOUNTER — Ambulatory Visit (INDEPENDENT_AMBULATORY_CARE_PROVIDER_SITE_OTHER): Payer: Medicare Other | Admitting: Orthopaedic Surgery

## 2017-11-01 VITALS — BP 126/58 | HR 81 | Ht 64.0 in | Wt 195.0 lb

## 2017-11-01 DIAGNOSIS — S42292D Other displaced fracture of upper end of left humerus, subsequent encounter for fracture with routine healing: Secondary | ICD-10-CM

## 2017-11-01 NOTE — Progress Notes (Signed)
Office Visit Note   Patient: Hannah [name]           Date of Birth: 1945/11/22           MRN: [id number] Visit Date: 11/01/2017              Requested by: Mackie Pai, PA-C [address] [place], [place] [address] PCP: Mackie Pai, PA-C   Assessment & Plan: Visit Diagnoses:  1. Closed fracture of head of left humerus with routine healing, subsequent encounter     Plan: Nearly 3 months status post nondisplaced fracture of left humeral head.  Doing very well.  Able to perform all of her activities of daily living.  Has regained significant range of motion.  Continues to go to physical therapy.  Would like to continue for at least 2-3 more weeks.  I would approve.  We will plan to see her back as necessary.  She has done very well  Follow-Up Instructions: Return if symptoms worsen or fail to improve.   Orders:  No orders of the defined types were placed in this encounter.  No orders of the defined types were placed in this encounter.     Procedures: No procedures performed   Clinical Data: No additional findings.   Subjective: Chief Complaint  Patient presents with  . Left Shoulder - Follow-up, Pain  . Follow-up    L SHOULDER F/U, DOING PT AND GETTING BETTER  Nearly 3 months post injury with nondisplaced fracture of left humeral head.  Has regained significant function with little if any limitation.  Continues to go to physical therapy for range of motion and strengthening.  No related numbness or tingling.  No difficulty sleeping.  HPI  Review of Systems  Constitutional: Negative for fatigue and fever.  HENT: Negative for ear pain.   Eyes: Negative for pain.  Respiratory: Negative for cough and shortness of breath.   Cardiovascular: Negative for leg swelling.  Gastrointestinal: Negative for constipation and diarrhea.  Genitourinary: Negative for difficulty urinating.  Musculoskeletal: Positive for back pain. Negative for neck pain.  Skin:  Negative for rash.  Allergic/Immunologic: Negative for food allergies.  Neurological: Positive for weakness. Negative for numbness.  Hematological: Bruises/bleeds easily.  Psychiatric/Behavioral: Negative for sleep disturbance.     Objective: Vital Signs: BP (!) 126/58 (BP Location: Left Arm, Patient Position: Sitting, Cuff Size: Normal)   Pulse 81   Ht 5\' 4"  (1.626 m)   Wt 195 lb (88.5 kg)   BMI 33.47 kg/m   Physical Exam  Constitutional: She is oriented to person, place, and time. She appears well-developed and well-nourished.  HENT:  Mouth/Throat: Oropharynx is clear and moist.  Eyes: Pupils are equal, round, and reactive to light. EOM are normal.  Pulmonary/Chest: Effort normal.  Neurological: She is alert and oriented to person, place, and time.  Skin: Skin is warm and dry.  Psychiatric: She has a normal mood and affect. Her behavior is normal.    Ortho Exam awake alert and oriented x3.  Comfortable sitting.  Accompanied by her husband.  135 degrees of overhead flexion left arm without pain.  Good strength.  Abduction 90 degrees.  About 35 to 40 degrees of external rotation without crepitation.  Good grip and good release.  Neurovascular exam intact.  Able to get her hand to her mouth to her head  Specialty Comments:  No specialty comments available.  Imaging: No results found.   PMFS History: Patient Active Problem List  Diagnosis Date Noted  . Closed fracture of head of left humerus 09/09/2017  . Syncope 09/06/2017  . Seizure-like activity ([place]) 09/01/2017  . AKI (acute kidney injury) ([place]) 09/01/2017  . Adrenal insufficiency ([place]) 07/03/2016  . Seizure ([place]) 06/29/2016  . Chemotherapy-induced peripheral neuropathy ([place]) 04/27/2016  . 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 Osteomyelitis of toe of left foot ([place]) 04/06/2016  . Mania ([place]) 03/05/2016  . GERD (gastroesophageal reflux disease) 02/29/2016  . Chemotherapy induced nausea and vomiting  02/24/2016  . Port catheter in place 02/17/2016  . Sensorineural hearing loss (SNHL) of both ears 01/06/2016  . Breast cancer of upper-inner quadrant of right female breast ([place]) 12/16/2015  . Osteopenia of multiple sites 10/19/2015  . CKD (chronic kidney disease), stage IV ([place]) 09/24/2013  . Chronic diastolic CHF (congestive heart failure) ([place]) 09/24/2013  . Acute on chronic combined systolic and diastolic congestive heart failure ([place]) 07/17/2013  . Atrial tachycardia ([place]) 04/03/2013  . CAD (coronary artery disease) 01/24/2011  . Chronic coronary artery disease 01/24/2011  . History of aortic valve replacement 10/27/2010  . Hyperlipidemia   . Nonischemic cardiomyopathy ([place])   . Anemia   . Type 2 diabetes mellitus ([place])   . Iron deficiency anemia   . Allergic rhinitis 10/14/2009  . Essential hypertension 05/12/2009  . H/O atrial tachycardia 05/12/2009  . Asthma 05/12/2009   Past Medical History:  Diagnosis Date  . Abnormally small mouth   . Anemia   . Bilateral carotid artery stenosis    Bilateral ICA 40-59%/  >50% LECA  . Breast cancer ([place]) 01/18/2016   right breast  . Breast cancer of upper-inner quadrant of right female breast ([place]) 12/16/2015  . CAD (coronary artery disease) cardiologist-  dr Aundra Dubin   Remote CABG in 1998 with redo in 2009  . CHF (congestive heart failure) ([facility])    EF is low normal at 50 to 55% per echo in Jan. 2011  . GERD (gastroesophageal reflux disease)   . Hearing loss of right ear   . History of non-ST elevation myocardial infarction (NSTEMI)    Sept 2014--  thought to be type II HTN w/ LHC without infarct related artery and patent grafts  . History of radiation therapy 05/24/16-07/26/16   right breast 50.4 Gy in 28 fractions, right breast boost 10 Gy in 5 fractions  . Hyperlipidemia   . Hypertension    Resolved.  . Iron deficiency anemia   . Moderate persistent asthma    pulmologist-  Dr. Malvin Johns  . Nonischemic cardiomyopathy ([place])   . PAF  (paroxysmal atrial fibrillation) ([place])   . Personal history of chemotherapy 2017  . Personal history of radiation therapy 2017  . Psoriasis    right leg  . Renal calculus, right   . Renal insufficiency, mild   . S/P AVR    prosthesis valve placement 2009 at same time re-do CABG  . 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 Type 2 diabetes mellitus ([place])    monitored by dr Dwyane Dee    Family History  Problem Relation Age of Onset  . Heart disease Father   . Heart failure Father   . Diabetes Maternal Grandmother   . Heart disease Maternal Grandmother   . Diabetes Son   . Healthy Brother        #1  . Heart attack Brother        #2  . Heart disease Brother        #2  Past Surgical History:  Procedure Laterality Date  . AORTIC VALVE REPLACEMENT  2009   #47mm [facility] Ease pericardial valve (done same time is CABG)  . BREAST LUMPECTOMY Right 01/18/2016  . BREAST LUMPECTOMY WITH RADIOACTIVE SEED AND SENTINEL LYMPH NODE BIOPSY Right 01/18/2016   Procedure: RIGHT BREAST LUMPECTOMY WITH RADIOACTIVE SEED AND SENTINEL LYMPH NODE BIOPSY;  Surgeon: Alphonsa Overall, MD;  Location: [facility];  Service: General;  Laterality: Right;  . CARDIAC CATHETERIZATION  03/23/2008   Pre-redo CABG: L main OK, LAD (T), CFX (T), OM1 99%, RCA (T), LIMA-LAD OK, SVG-OM(?3) OK w/ little florw to OM2, SVG-RCA OK. EF NL  . CARPAL TUNNEL RELEASE    . COLONOSCOPY    . CORONARY ARTERY BYPASS GRAFT  1998 &  re-do 2009   Had LIMA to DX/LAD, SVG to 2 marginal branches and SVG to [facility] originally; SVG to 3rd OM and PD at time of redo  . CYSTOSCOPY W/ URETERAL STENT PLACEMENT Right 12/20/2014   Procedure: CYSTOSCOPY WITH RETROGRADE PYELOGRAM/URETERAL STENT PLACEMENT;  Surgeon: Cleon Gustin, MD;  Location: [facility];  Service: Urology;  Laterality: Right;  . EYE SURGERY Bilateral    cataracts  . HOLMIUM LASER APPLICATION Right 0/16/0109   Procedure:  HOLMIUM LASER LITHOTRIPSY;  Surgeon: Cleon Gustin, MD;  Location: [facility];  Service: Urology;  Laterality: Right;  . LEFT HEART CATHETERIZATION WITH CORONARY/GRAFT ANGIOGRAM N/A 02/23/2013   Procedure: LEFT HEART CATHETERIZATION WITH Beatrix Fetters;  Surgeon: Blane Ohara, MD;  Location: [facility] CATH LAB;  Service: Cardiovascular;  Laterality: N/A;  . LOOP RECORDER INSERTION N/A 08/30/2017   Procedure: LOOP RECORDER INSERTION;  Surgeon: Evans Lance, MD;  Location: [place] CV LAB;  Service: Cardiovascular;  Laterality: N/A;  . PORTACATH PLACEMENT Left 01/18/2016   Procedure: INSERTION PORT-A-CATH;  Surgeon: Alphonsa Overall, MD;  Location: [place] [facility];  Service: General;  Laterality: Left;  . portacath removal    . TONSILLECTOMY    . TRANSTHORACIC ECHOCARDIOGRAM  02-24-2013      mild LVH,  ef 50-55%/  AV bioprosthesis was present with very mild stenosis and no regurg., mean grandient 36mmHg, peak grandient 30mmHg /  mild MR/  mild LAE and RAE/  moderate TR  . TUBAL LIGATION     Social History   Occupational History  . Occupation: retired  Tobacco Use  . Smoking status: Never Smoker  . Smokeless tobacco: Never Used  Substance and Sexual Activity  . Alcohol use: No  . Drug use: No  . Sexual activity: Yes    Birth control/protection: None

## 2017-11-01 NOTE — Telephone Encounter (Signed)
Called rx in. Pharmacy states pt will be able to get medication tomorrow

## 2017-11-05 ENCOUNTER — Ambulatory Visit (INDEPENDENT_AMBULATORY_CARE_PROVIDER_SITE_OTHER): Payer: Medicare Other | Admitting: *Deleted

## 2017-11-05 DIAGNOSIS — R55 Syncope and collapse: Secondary | ICD-10-CM | POA: Diagnosis not present

## 2017-11-05 NOTE — Progress Notes (Signed)
[facility] Summary Report / Loop Recorder 

## 2017-11-07 ENCOUNTER — Encounter: Payer: Self-pay | Admitting: Medical

## 2017-11-07 ENCOUNTER — Telehealth: Payer: Self-pay | Admitting: Internal Medicine

## 2017-11-07 NOTE — Telephone Encounter (Signed)
Returned call to Pt. Advised Pt per Dr. Harvie Heck note dated 09/11/2017 "Per pt during hosptialization she had gabapentin and tramadol dosing reduced to decrease chance of her syncope episodes occurring from seizure" Per review of Pt medication list her tramadol was removed during this office visit with Dr. Harvie Heck.  Advised Pt to call Dr. Harvie Heck and ask if she can restart tramadol for pain.   Pt indicates understanding.

## 2017-11-07 NOTE — Telephone Encounter (Signed)
New Message   Pt c/o medication issue:  1. Name of Medication: tramadol  2. How are you currently taking this medication (dosage and times per day)?   3. Are you having a reaction (difficulty breathing--STAT)?   4. What is your medication issue? Patient is calling in reference to the tramadol that she was once on but was taken off of it because it was conflicting with her other medication. She would like to know what medication is recommended that will not interfere with her other medications.

## 2017-11-08 ENCOUNTER — Encounter: Payer: Self-pay | Admitting: Endocrinology

## 2017-11-08 ENCOUNTER — Ambulatory Visit (INDEPENDENT_AMBULATORY_CARE_PROVIDER_SITE_OTHER): Payer: Medicare Other | Admitting: Endocrinology

## 2017-11-08 VITALS — BP 138/78 | HR 82 | Ht 64.0 in | Wt 198.4 lb

## 2017-11-08 DIAGNOSIS — E1165 Type 2 diabetes mellitus with hyperglycemia: Secondary | ICD-10-CM | POA: Diagnosis not present

## 2017-11-08 DIAGNOSIS — N289 Disorder of kidney and ureter, unspecified: Secondary | ICD-10-CM | POA: Diagnosis not present

## 2017-11-08 DIAGNOSIS — Z794 Long term (current) use of insulin: Secondary | ICD-10-CM

## 2017-11-08 LAB — POCT GLYCOSYLATED HEMOGLOBIN (HGB A1C): Hemoglobin A1C: 6.4 % — AB (ref 4.0–5.6)

## 2017-11-08 NOTE — Progress Notes (Signed)
Patient ID: Hannah [name], female   DOB: February 24, 1946, 72 y.o.   MRN: [id number]           Reason for Appointment: Follow-up for Type 2 Diabetes   History of Present Illness:          Date of diagnosis of type 2 diabetes mellitus: Late 1980s        Background history:  She was previously treated with metformin prior to starting insulin She thinks she has been on insulin for at least 10 years and mostly taking Lantus and other types of insulin with it Her metformin was stopped when she started insulin and also has had renal dysfunction Previously her A1c had been consistently over 8% and occasionally higher.  Recent history:   INSULIN regimen is described as:  V-go 30 basal, mealtime coverage 8 u acB, lunch 1-2 clicks and 7-68 acS  She has been on the V-go pump since 03/2015  Most recent A1c 7.7 %, now back down to  6.4  Current blood sugar patterns and problems identified:   She now has been on the 30 units basal pump for some time when this has eliminated her low sugars in the 90s  She only has had one low normal sugar of 68 and her morning sugars are fairly stable  This is despite her sometimes taking extra insulin to correct the higher readings after supper  AVERAGE blood sugar after supper is still over 200 however  Blood sugars in the evenings are variable but occasionally even over 300  She does tend to have a snack in the afternoon but is not covered with a bolus and has a couple of relatively high readings before supper also, does have some carbohydrate with a snack  Last night her blood sugar was 164 after eating but she had very little carbohydrate at the meal  Sporadically have high readings in the afternoon also but not checking much  She checks her blood sugar daily in the morning although at various times and these are very consistently near normal  May rarely overestimate her bolus at lunchtime and blood sugar was 58 last Monday afternoon  TRULICITY was  increased up to 1.5 on the last visit  However has not been able to lose weight despite trying to exercise twice a week  Non-insulin hypoglycemic drugs the patient is taking are: Trulicity 1.5 mg weekly  Side effects from medications have been: None  Compliance with the medical regimen: Fair  Glucose monitoring:  done about 2 + times a day         Glucometer:  Accu-Chek [employer] Blood Glucose readings by download for the last 4 weeks:  Mean values apply above for all meters except median for One Touch Afternoon  PRE-MEAL Fasting Lunch   afternoon Bedtime Overall  Glucose range:  68-125      Mean/median:  93  155   151+/-63   POST-MEAL PC Breakfast PC Lunch PC Dinner  Glucose range:    118-310  Mean/median:   207   Previous readings:  Mean values apply above for all meters except median for One Touch  PRE-MEAL Fasting Lunch Dinner Bedtime Overall  Glucose range: 65-178  68, 119   137-303    Mean/median: 107   203  127   POST-MEAL PC Breakfast PC Lunch PC Dinner  Glucose range:  58-373    Mean/median:      Self-care: The diet that the patient has been following is: tries to  limit fats and carbs.  Eating out periodically She uses diet green tea or diet drinks and no sugar in drinks    Lunch 1-2 pm Dinner 6-7 PM; Hs snack   Typical meal intake: Breakfast is oatrmeal or cheese toast              Dietician visit, most recent: 5/16 And in 7/16 with oncology dietitian                Exercise: Gym in 2/19, 2-3/7 days  Weight history:  Wt Readings from Last 3 Encounters:  11/08/17 198 lb 6.4 oz (90 kg)  11/01/17 195 lb (88.5 kg)  10/01/17 197 lb 12.8 oz (89.7 kg)    Glycemic control:      Lab Results  Component Value Date   HGBA1C 6.4 (A) 11/08/2017   HGBA1C 7.7 (H) 05/16/2017   HGBA1C 7.0 (H) 02/14/2017   Lab Results  Component Value Date   MICROALBUR 13.3 (H) 02/14/2017   LDLCALC 48 08/01/2017   CREATININE 1.67 (H) 10/01/2017       Allergies as of  11/08/2017      Reactions   Amoxicillin Rash, Other (See Comments)   Has patient had a PCN reaction causing immediate rash, facial/tongue/throat swelling, SOB or lightheadedness with hypotension: No Has patient had a PCN reaction causing severe rash involving mucus membranes or skin necrosis: Yes Has patient had a PCN reaction that required hospitalization No Has patient had a PCN reaction occurring within the last 10 years: No If all of the above answers are "NO", then may proceed with Cephalosporin use.   Imdur [isosorbide Dinitrate] Other (See Comments)   Headache/severe hypotension/Syncope   Tape    Must use paper tape   Arimidex [anastrozole] Nausea Only   Latex Itching   Tetracycline Rash      Medication List        Accurate as of 11/08/17 12:44 PM. Always use your most recent med list.          ACCU-CHEK GUIDE test strip Generic drug:  glucose blood Use to test blood sugar 3 times daily   acetaminophen 325 MG tablet Commonly known as:  TYLENOL Take 2 tablets (650 mg total) by mouth every 6 (six) hours as needed for mild pain (or Fever >/= 101).   albuterol 108 (90 Base) MCG/ACT inhaler Commonly known as:  PROVENTIL HFA;VENTOLIN HFA Inhale 2 puffs into the lungs every 6 (six) hours as needed for wheezing or shortness of breath.   amiodarone 200 MG tablet Commonly known as:  PACERONE Take 1 tablet (200 mg total) by mouth daily.   aspirin 81 MG EC tablet Take 1 tablet (81 mg total) by mouth daily.   clopidogrel 75 MG tablet Commonly known as:  PLAVIX TAKE 1 TABLET BY MOUTH  DAILY   Dulaglutide 1.5 MG/0.5ML Sopn Commonly known as:  TRULICITY Inject contents of pen once a week   exemestane 25 MG tablet Commonly known as:  AROMASIN TAKE ONE TABLET BY MOUTH DAILY AFTER BREAKFAST   ezetimibe 10 MG tablet Commonly known as:  ZETIA Take 1 tablet (10 mg total) by mouth daily.   fludrocortisone 0.1 MG tablet Commonly known as:  FLORINEF Take 1 tablet (0.1 mg  total) by mouth daily.   furosemide 20 MG tablet Commonly known as:  LASIX Take 1 tablet (20 mg total) by mouth daily as needed for fluid.   HUMALOG KWIKPEN 100 UNIT/ML KiwkPen Generic drug:  insulin lispro Inject 2-6 Units  into the skin at bedtime as needed (CBG >200).   insulin lispro 100 UNIT/ML injection Commonly known as:  HUMALOG INJECT 76 UNITS DAILY WITH  V-GO PUMP   prochlorperazine 10 MG tablet Commonly known as:  COMPAZINE TAKE 1 TABLET BY MOUTH  EVERY 6 HOURS AS NEEDED FOR NAUSEA OR VOMITING   rosuvastatin 40 MG tablet Commonly known as:  CRESTOR TAKE ONE TABLET BY MOUTH DAILY   SYMBICORT 160-4.5 MCG/ACT inhaler Generic drug:  budesonide-formoterol INHALE TWO PUFFS BY MOUTH TWICE DAILY   V-GO 30 Kit 1 Device by Does not apply route daily.   zolpidem 5 MG tablet Commonly known as:  AMBIEN Take 1 tablet (5 mg total) by mouth at bedtime as needed for sleep.(can fill on Nov 03, 2017 since she will run out and pharmacy closed on 27th)       Allergies:  Allergies  Allergen Reactions  . Amoxicillin Rash and Other (See Comments)    Has patient had a PCN reaction causing immediate rash, facial/tongue/throat swelling, SOB or lightheadedness with hypotension: No Has patient had a PCN reaction causing severe rash involving mucus membranes or skin necrosis: Yes Has patient had a PCN reaction that required hospitalization No Has patient had a PCN reaction occurring within the last 10 years: No If all of the above answers are "NO", then may proceed with Cephalosporin use.   . Imdur [Isosorbide Dinitrate] Other (See Comments)    Headache/severe hypotension/Syncope  . Tape     Must use paper tape  . Arimidex [Anastrozole] Nausea Only  . Latex Itching  . Tetracycline Rash    Past Medical History:  Diagnosis Date  . Abnormally small mouth   . Anemia   . Bilateral carotid artery stenosis    Bilateral ICA 40-59%/  >50% LECA  . Breast cancer ([place]) 01/18/2016    right breast  . Breast cancer of upper-inner quadrant of right female breast ([place] [place] [facility]) 12/16/2015  . CAD (coronary artery disease) cardiologist-  dr Aundra Dubin   Remote CABG in 1998 with redo in 2009  . CHF (congestive heart failure) (HCC)    EF is low normal at 50 to 55% per echo in Jan. 2011  . GERD (gastroesophageal reflux disease)   . Hearing loss of right ear   . History of non-ST elevation myocardial infarction (NSTEMI)    Sept 2014--  thought to be type II HTN w/ LHC without infarct related artery and patent grafts  . History of radiation therapy 05/24/16-07/26/16   right breast 50.4 Gy in 28 fractions, right breast boost 10 Gy in 5 fractions  . Hyperlipidemia   . Hypertension    Resolved.  . Iron deficiency anemia   . Moderate persistent asthma    pulmologist-  Dr. Malvin Johns  . Nonischemic cardiomyopathy ([place])   . PAF (paroxysmal atrial fibrillation) ([place])   . Personal history of chemotherapy 2017  . Personal history of radiation therapy 2017  . Psoriasis    right leg  . Renal calculus, right   . Renal insufficiency, mild   . S/P AVR    prosthesis valve placement 2009 at same time re-do CABG  . 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 Type 2 diabetes mellitus ([place])    monitored by dr Dwyane Dee    Past Surgical History:  Procedure Laterality Date  . AORTIC VALVE REPLACEMENT  [facility]done same time is CABG)  . BREAST LUMPECTOMY Right 01/18/2016  .  BREAST LUMPECTOMY WITH RADIOACTIVE SEED AND SENTINEL LYMPH NODE BIOPSY Right 01/18/2016   Procedure: RIGHT BREAST LUMPECTOMY WITH RADIOACTIVE SEED AND SENTINEL LYMPH NODE BIOPSY;  Surgeon: Alphonsa Overall, MD;  Location: [place];  Service: General;  Laterality: Right;  . CARDIAC CATHETERIZATION  03/23/2008   Pre-redo CABG: L main OK, LAD (T), CFX (T), OM1 99%, RCA (T), LIMA-LAD OK, SVG-OM(?3) OK w/ little florw to OM2, SVG-RCA OK. EF NL  . CARPAL TUNNEL RELEASE    . COLONOSCOPY    . CORONARY ARTERY  BYPASS GRAFT  1998 &  re-do 2009   Had LIMA to DX/LAD, SVG to 2 marginal branches and SVG to [facility] originally; SVG to 3rd OM and PD at time of redo  . CYSTOSCOPY W/ URETERAL STENT PLACEMENT Right 12/20/2014   Procedure: CYSTOSCOPY WITH RETROGRADE PYELOGRAM/URETERAL STENT PLACEMENT;  Surgeon: Cleon Gustin, MD;  Location: [facility];  Service: Urology;  Laterality: Right;  . EYE SURGERY Bilateral    cataracts  . HOLMIUM LASER APPLICATION Right 3/73/5789   Procedure:  HOLMIUM LASER LITHOTRIPSY;  Surgeon: Cleon Gustin, MD;  Location: [facility];  Service: Urology;  Laterality: Right;  . LEFT HEART CATHETERIZATION WITH CORONARY/GRAFT ANGIOGRAM N/A 02/23/2013   Procedure: LEFT HEART CATHETERIZATION WITH Beatrix Fetters;  Surgeon: Blane Ohara, MD;  Location: [facility] CATH LAB;  Service: Cardiovascular;  Laterality: N/A;  . LOOP RECORDER INSERTION N/A 08/30/2017   Procedure: LOOP RECORDER INSERTION;  Surgeon: Evans Lance, MD;  Location: [place] CV LAB;  Service: Cardiovascular;  Laterality: N/A;  . PORTACATH PLACEMENT Left 01/18/2016   Procedure: INSERTION PORT-A-CATH;  Surgeon: Alphonsa Overall, MD;  Location: [place];  Service: General;  Laterality: Left;  . portacath removal    . TONSILLECTOMY    . TRANSTHORACIC ECHOCARDIOGRAM  02-24-2013      mild LVH,  ef 50-55%/  AV bioprosthesis was present with very mild stenosis and no regurg., mean grandient 76mHg, peak grandient 224mg /  mild MR/  mild LAE and RAE/  moderate TR  . TUBAL LIGATION      Family History  Problem Relation Age of Onset  . Heart disease Father   . Heart failure Father   . Diabetes Maternal Grandmother   . Heart disease Maternal Grandmother   . Diabetes Son   . Healthy Brother        #1  . Heart attack Brother        #2  . Heart disease Brother        #2    Social History:  reports that she has never smoked. She has never used smokeless tobacco. She reports that she does  not drink alcohol or use drugs.    Review of Systems    Lipid history: On 40 mg Crestor along with Zetia      Lab Results  Component Value Date   CHOL 109 08/01/2017   HDL 42.30 08/01/2017   LDLCALC 48 08/01/2017   LDLDIRECT 53.0 10/08/2016   TRIG 90.0 08/01/2017   CHOLHDL 3 08/01/2017           Eyes:  No history of retinopathy  She is followed by nephrologist for renal dysfunction and has had recent visit  She has had significant anemia but currently not taking any ESA for this  Lab Results  Component Value Date   CREATININE 1.67 (H) 10/01/2017   CREATININE 1.70 (H) 09/11/2017   CREATININE 2.69 (H) 09/07/2017    Diabetic  foot exam in 7/18: She has  loss of monofilament sensation in her distal feet   Physical Examination:  BP 138/78 (BP Location: Left Arm, Patient Position: Sitting, Cuff Size: Normal)   Pulse 82   Ht 5' 4"  (1.626 m)   Wt 198 lb 6.4 oz (90 kg)   SpO2 98%   BMI 34.06 kg/m    ASSESSMENT:  Diabetes type 2, on insulin pump    See history of present illness for detailed discussion of  current management, blood sugar patterns and problems identified  Her A1c is better at 6.4 although she still has significant high blood sugar readings at times especially after supper However no hypoglycemia overnight with reducing her basal rate down to 30 units Also has likely benefited from Trulicity 1.5 which was increased from the last visit  She is not bolusing adequately for afternoon snacks which have carbohydrate and also not proactively increasing her insulin at suppertime causing her average to be over 200 after supper She does take more insulin at bedtime as she thinks she needs to use of all the insulin in her pump and discussed that this is not necessary and may also cause low sugars at night Not clear if her blood sugars are consistently high before supper also Fasting readings are excellent   RENAL dysfunction: This appears to be stable and  followed by nephrologist  Anemia of chronic disease: Discussed that she should be on Procrit or other agent or treatment, reportedly hemoglobin is 9    PLAN:     She will do 2 units coverage for her afternoon snack consistently  Do less readings fasting and more readings after breakfast  Also check some readings before and after supper to see if she is getting adequate coverage for evening meal but most likely needs additional 2 units at supper on a regular basis  No change in Trulicity  Blood sugar targets at various times discussed  If able to check blood sugars 4 times a day she can use a freestyle libre system  Patient Instructions  Click 1x for afternoon snack   If sugars are still > 180 after dinner take 1 extra click at dinner daily     Counseling time on subjects discussed in assessment and plan sections is over 50% of today's 25 minute visit      Elayne Snare 11/08/2017, 12:44 PM   Note: This office note was prepared with Dragon voice recognition system technology. Any transcriptional errors that result from this process are unintentional.

## 2017-11-08 NOTE — Patient Instructions (Addendum)
Click 1x for afternoon snack   If sugars are still > 180 after dinner take 1 extra click at dinner daily

## 2017-11-15 ENCOUNTER — Encounter: Payer: Self-pay | Admitting: Medical

## 2017-11-18 ENCOUNTER — Encounter: Payer: Self-pay | Admitting: Medical

## 2017-11-19 LAB — HM DIABETES EYE EXAM

## 2017-11-20 ENCOUNTER — Encounter: Payer: Self-pay | Admitting: Medical

## 2017-11-20 ENCOUNTER — Ambulatory Visit (INDEPENDENT_AMBULATORY_CARE_PROVIDER_SITE_OTHER): Payer: Medicare Other | Admitting: Medical

## 2017-11-20 ENCOUNTER — Telehealth: Payer: Self-pay | Admitting: Hematology and Oncology

## 2017-11-20 ENCOUNTER — Inpatient Hospital Stay: Payer: Medicare Other | Attending: Hematology and Oncology | Admitting: Hematology and Oncology

## 2017-11-20 VITALS — BP 128/44 | HR 82 | Temp 98.4°F | Resp 16 | Ht 64.0 in | Wt 200.0 lb

## 2017-11-20 VITALS — BP 127/52 | HR 83 | Temp 98.5°F | Resp 17 | Ht 64.0 in | Wt 200.1 lb

## 2017-11-20 DIAGNOSIS — Z79811 Long term (current) use of aromatase inhibitors: Secondary | ICD-10-CM | POA: Diagnosis not present

## 2017-11-20 DIAGNOSIS — Z170 Estrogen receptor positive status [ER+]: Secondary | ICD-10-CM

## 2017-11-20 DIAGNOSIS — C50211 Malignant neoplasm of upper-inner quadrant of right female breast: Secondary | ICD-10-CM | POA: Diagnosis present

## 2017-11-20 DIAGNOSIS — M5442 Lumbago with sciatica, left side: Secondary | ICD-10-CM | POA: Diagnosis not present

## 2017-11-20 DIAGNOSIS — Z9221 Personal history of antineoplastic chemotherapy: Secondary | ICD-10-CM

## 2017-11-20 DIAGNOSIS — Z923 Personal history of irradiation: Secondary | ICD-10-CM | POA: Diagnosis not present

## 2017-11-20 DIAGNOSIS — G8929 Other chronic pain: Secondary | ICD-10-CM | POA: Diagnosis not present

## 2017-11-20 DIAGNOSIS — Z794 Long term (current) use of insulin: Secondary | ICD-10-CM | POA: Diagnosis not present

## 2017-11-20 DIAGNOSIS — F1190 Opioid use, unspecified, uncomplicated: Secondary | ICD-10-CM

## 2017-11-20 DIAGNOSIS — G629 Polyneuropathy, unspecified: Secondary | ICD-10-CM | POA: Diagnosis not present

## 2017-11-20 DIAGNOSIS — Z7982 Long term (current) use of aspirin: Secondary | ICD-10-CM | POA: Insufficient documentation

## 2017-11-20 DIAGNOSIS — Z79899 Other long term (current) drug therapy: Secondary | ICD-10-CM | POA: Diagnosis not present

## 2017-11-20 MED ORDER — EXEMESTANE 25 MG PO TABS: ORAL_TABLET | ORAL | 3 refills | Status: DC

## 2017-11-20 MED ORDER — TRAMADOL HCL 50 MG PO TABS: 50.0000 mg | ORAL_TABLET | Freq: Two times a day (BID) | ORAL | 0 refills | Status: DC | PRN

## 2017-11-20 NOTE — Assessment & Plan Note (Signed)
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 Adjuvant chemotherapy with Taxotere and Cytoxan (3cycles). Adriamycin was not given because of her cardiac problems, Received3 cycles of TC 02/17/16 to 04/06/16 2. Adjuvant radiation 05/24/16 to 07/26/16 3 followed by adjuvant antiestrogen therapy (because a satellite tumor is weakly ER and PR positive). Started 08/23/2016 switched to anastrozole 04/04/2017 ---------------------------------------------------------------------------------- Current treatment: Could not tolerate letrozole on anastrozole.  Currently on exemestane 25 mg daily  Exemestane toxicities: Patient has not had any side effects to exemestane.  It does not cost her any more than the other antiestrogen therapies.  Breast cancer surveillance: Mammograms 02/07/2018  Return to clinic in 1 year for surveillance and follow-up

## 2017-11-20 NOTE — Telephone Encounter (Signed)
Gave patient avs and calendar of upcoming June 2020 appointments.  °

## 2017-11-20 NOTE — Patient Instructions (Signed)
For your severe back pain, I am going to prescribe you 50 mg of tramadol.  60 tablets a month to use as needed.  Rx advised and given and reviewed.  We had discussion today about whether or not tramadol had caused you seizures in the past.  Your EEG was negative and in the and those events were determined to be syncopal events related to extreme low blood pressure.  Nevertheless we will proceed with caution.  If any adverse side effects please let me know.  You sign controlled medication contract today.  Please give UDS.  Follow-up in 1 month or as needed.

## 2017-11-20 NOTE — Progress Notes (Signed)
Patient Care Team: Hannah [name] as PCP - General (Internal Medicine) Hannah Overall, MD as Consulting Physician (General Surgery) Hannah Lose, MD as Consulting Physician (Hematology and Oncology) Hannah Pray, MD as Consulting Physician (Radiation Oncology) Hannah Lance, MD as Consulting Physician (Cardiology) Hannah Gobble, MD as Consulting Physician (Pulmonary Disease) Hannah Snare, MD as Consulting Physician (Endocrinology) Hannah Bison, Charlestine Massed, NP as Nurse Practitioner (Hematology and Oncology) Hannah Balls., MD (Neurology) Hannah Gala, MD as Consulting Physician (Otolaryngology) Hannah [name], DPM as Consulting Physician (Podiatry)  DIAGNOSIS:  Encounter Diagnosis  Name Primary?  . Malignant neoplasm of upper-inner quadrant of right breast in female, estrogen receptor positive (Hannah [name]) Yes    SUMMARY OF ONCOLOGIC HISTORY:   Breast cancer of upper-inner quadrant of right female breast ([place])   12/14/2015 Initial Diagnosis    Right breast biopsy 12:30 position: 2 masses, 1.6 cm mass: Invasive ductal carcinoma, grade 2, ER 0%, PR 0%, HER-2 negative, Ki-67 70%; satellite mass 8 mm: IDC grade 2 ER 5%, PR 5%, HER-2 negative, Ki-67 50%; T1cN0 stage IA clinical stage      01/18/2016 Surgery    Right lumpectomy: Multifocal IDC grade 3, 1.9 cm  ER 0%, PR 0%, HER-2 negative, Ki-67 70% and 0.8 cm satellite mass ER 5%, PR 2%, HER-2 negative, Ki-67 50%, high-grade DCIS, margins negative, 0/1 lymph nodes negative T1 cN0 stage IA      02/17/2016 - 04/06/2016 Chemotherapy    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       04/08/2016 - 04/23/2016 Hospital Admission    Hosp adm for cellulitis      05/24/2016 - 07/25/2016 Radiation Therapy     Adj XRT      06/29/2016 - 07/04/2016 Hospital Admission    Seizure like activity, MRI brain and EEG unremarkable       08/16/2016 -  Anti-estrogen oral therapy    Letrozole could  not tolerate it due to dizziness and lightheadedness, switched to anastrozole 04/04/2017 switched to exemestane 05/22/2017       CHIEF COMPLIANT: Follow-up on exemestane therapy  INTERVAL HISTORY: Hannah [name] is a 72 year old with above-mentioned history of right breast cancer treated with lumpectomy and adjuvant chemotherapy and is completed radiation.  She is currently on antiestrogen therapy.  She could not tolerate letrozole or anastrozole but is able to tolerate exemestane extremely well.  REVIEW OF SYSTEMS:   Constitutional: Denies fevers, chills or abnormal weight loss Eyes: Denies blurriness of vision Ears, nose, mouth, throat, and face: Denies mucositis or sore throat Respiratory: Denies cough, dyspnea or wheezes Cardiovascular: Denies palpitation, chest discomfort Gastrointestinal:  Denies nausea, heartburn or change in bowel habits Skin: Denies abnormal skin rashes Lymphatics: Denies new lymphadenopathy or easy bruising Neurological:Denies numbness, tingling or new weaknesses Behavioral/Psych: Mood is stable, no new changes  Extremities: No lower extremity edema Breast:  denies any pain or lumps or nodules in either breasts All other systems were reviewed with the patient and are negative.  I have reviewed the past medical history, past surgical history, social history and family history with the patient and they are unchanged from previous note.  ALLERGIES:  is allergic to amoxicillin; imdur [isosorbide dinitrate]; tape; arimidex [anastrozole]; latex; and tetracycline.  MEDICATIONS:  Current Outpatient Medications  Medication Sig Dispense Refill  . ACCU-CHEK GUIDE test strip Use to test blood sugar 3 times daily  5  . acetaminophen (TYLENOL) 325 MG tablet Take 2 tablets (650 mg  total) by mouth every 6 (six) hours as needed for mild pain (or Fever >/= 101).    . albuterol (PROVENTIL HFA;VENTOLIN HFA) 108 (90 Base) MCG/ACT inhaler Inhale 2 puffs into the lungs every 6  (six) hours as needed for wheezing or shortness of breath.    . amiodarone (PACERONE) 200 MG tablet Take 1 tablet (200 mg total) by mouth daily. 90 tablet 3  . aspirin EC 81 MG EC tablet Take 1 tablet (81 mg total) by mouth daily. (Patient taking differently: Take 81 mg by mouth every evening. )    . clopidogrel (PLAVIX) 75 MG tablet TAKE 1 TABLET BY MOUTH  DAILY (Patient taking differently: TAKE 1 TABLET (75 MG) BY MOUTH DAILY AFTER BREAKFAST) 90 tablet 3  . Dulaglutide (TRULICITY) 1.5 MG/0.5ML SOPN Inject contents of pen once a week (Patient taking differently: Inject 1.5 mg into the skin every Saturday. ) 12 pen 1  . exemestane (AROMASIN) 25 MG tablet TAKE ONE TABLET BY MOUTH DAILY AFTER BREAKFAST 90 tablet 3  . ezetimibe (ZETIA) 10 MG tablet Take 1 tablet (10 mg total) by mouth daily. 90 tablet 2  . fludrocortisone (FLORINEF) 0.1 MG tablet Take 1 tablet (0.1 mg total) by mouth daily. 90 tablet 3  . furosemide (LASIX) 20 MG tablet Take 1 tablet (20 mg total) by mouth daily as needed for fluid. 30 tablet 0  . Insulin Disposable Pump (V-GO 30) KIT 1 Device by Does not apply route daily. (Patient taking differently: 1 Device by Does not apply route continuous. ) 90 kit 3  . insulin lispro (HUMALOG KWIKPEN) 100 UNIT/ML KiwkPen Inject 2-6 Units into the skin at bedtime as needed (CBG >200).    . insulin lispro (HUMALOG) 100 UNIT/ML injection INJECT 76 UNITS DAILY WITH  V-GO PUMP 70 mL 3  . rosuvastatin (CRESTOR) 40 MG tablet TAKE ONE TABLET BY MOUTH DAILY (Patient taking differently: TAKE ONE TABLET (40 MG) BY MOUTH DAILY AT BEDTIME) 90 tablet 3  . SYMBICORT 160-4.5 MCG/ACT inhaler INHALE TWO PUFFS BY MOUTH TWICE DAILY 10.2 g 2  . zolpidem (AMBIEN) 5 MG tablet Take 1 tablet (5 mg total) by mouth at bedtime as needed for sleep.(can fill on Nov 03, 2017 since she will run out and pharmacy closed on 27th) 30 tablet 0   No current facility-administered medications for this visit.     PHYSICAL  EXAMINATION: ECOG PERFORMANCE STATUS: 0 - Asymptomatic  Vitals:   11/20/17 1111  BP: (!) 127/52  Pulse: 83  Resp: 17  Temp: 98.5 F (36.9 C)  SpO2: 99%   Filed Weights   11/20/17 1111  Weight: 200 lb 1.6 oz (90.8 kg)    GENERAL:alert, no distress and comfortable SKIN: skin color, texture, turgor are normal, no rashes or significant lesions EYES: normal, Conjunctiva are pink and non-injected, sclera clear OROPHARYNX:no exudate, no erythema and lips, buccal mucosa, and tongue normal  NECK: supple, thyroid normal size, non-tender, without nodularity LYMPH:  no palpable lymphadenopathy in the cervical, axillary or inguinal LUNGS: clear to auscultation and percussion with normal breathing effort HEART: regular rate & rhythm and no murmurs and no lower extremity edema ABDOMEN:abdomen soft, non-tender and normal bowel sounds MUSCULOSKELETAL:no cyanosis of digits and no clubbing  NEURO: alert & oriented x 3 with fluent speech, no focal motor/sensory deficits EXTREMITIES: No lower extremity edema BREAST: No palpable masses or nodules in either right or left breasts. No palpable axillary supraclavicular or infraclavicular adenopathy no breast tenderness or nipple discharge. (exam performed   in the presence of a chaperone)  LABORATORY DATA:  I have reviewed the data as listed CMP Latest Ref Rng & Units 10/11/2017 10/01/2017 09/11/2017  Glucose 70 - 99 mg/dL 141(H) 220(H) 129(H)  BUN 8 - 27 mg/dL - 25 31(H)  Creatinine 0.57 - 1.00 mg/dL - 1.67(H) 1.70(H)  Sodium 134 - 144 mmol/L - 141 142  Potassium 3.5 - 5.2 mmol/L - 4.2 4.9  Chloride 96 - 106 mmol/L - 104 108  CO2 20 - 29 mmol/L - 25 29  Calcium 8.7 - 10.3 mg/dL - 9.1 9.3  Total Protein 6.0 - 8.5 g/dL - 5.9(L) 6.4  Total Bilirubin 0.0 - 1.2 mg/dL - 0.5 0.6  Alkaline Phos 39 - 117 IU/L - 75 89  AST 0 - 40 IU/L - 35 44(H)  ALT 0 - 32 IU/L - 42(H) 51(H)    Lab Results  Component Value Date   WBC 6.1 10/01/2017   HGB 9.1 (L)  10/01/2017   HCT 28.3 (L) 10/01/2017   MCV 97 10/01/2017   PLT 146 (L) 10/01/2017   NEUTROABS 6.9 09/06/2017    ASSESSMENT & PLAN:  Breast cancer of upper-inner quadrant of right female breast (HCC) Right lumpectomy 01/18/2016: Multifocal IDC grade 3, 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 Adjuvant chemotherapy with Taxotere and Cytoxan (3cycles). Adriamycin was not given because of her cardiac problems, Received3 cycles of TC 02/17/16 to 04/06/16 2. Adjuvant radiation 05/24/16 to 07/26/16 3 followed by adjuvant antiestrogen therapy (because a satellite tumor is weakly ER and PR positive). Started 08/23/2016 switched to anastrozole 04/04/2017 ---------------------------------------------------------------------------------- Current treatment: Could not tolerate letrozole on anastrozole.  Currently on exemestane 25 mg daily  Exemestane toxicities: Patient has not had any side effects to exemestane.  It does not cost her any more than the other antiestrogen therapies.  Breast cancer surveillance: Mammograms 02/07/2018  Return to clinic in 1 year for surveillance and follow-up   Orders Placed This Encounter  Procedures  . MM DIAG BREAST TOMO BILATERAL    Standing Status:   Future    Standing Expiration Date:   11/21/2018    Order Specific Question:   Reason for Exam (SYMPTOM  OR DIAGNOSIS REQUIRED)    Answer:   Annual diagnostic mammogram for breast cancer    Order Specific Question:   Preferred imaging location?    Answer:   [facility]   The patient has a good understanding of the [name] plan. she agrees with it. she will call with any problems that may develop before the next visit here.   Harriette Ohara, MD 11/20/17

## 2017-11-20 NOTE — Progress Notes (Signed)
Subjective:    Patient ID: Hannah [name], female    DOB: October 07, 1945, 72 y.o.   MRN: [id number]  HPI  Pt in for follow up on lower back pain for years  Pt had surgery in past. Pain levels vary based on activity   Patient pain level may vary from 3-7/10. Some days pain level will up to 7/10.  Pt was on tramadol in the past but it was stopped when she had work up for syncope. Thought was she may have had seizure. In hospital no eeg was found. Syncope was in the end determined to be related to hypotension. So florinef was prescribed.  Pt was also taken off of gabapentin at the same time she was taken off tramadol.  Pt can't take nsaids due to kidney function.  Mri  Scoliosis with superimposed degenerative changes which have progressed slightly since prior examination. Summary of pertinent findings includes:  L2-3 moderately large central protrusion. Multifactorial moderate spinal stenosis.  L3-4 multifactorial mild to moderate thecal sac narrowing. Mild bilateral foraminal narrowing greater on the left.  L4-5 multifactorial moderate to marked right foraminal narrowing and encroachment upon the exiting right L4 nerve root. Mild left foraminal narrowing. Multifactorial mild spinal stenosis with a slightly trefoil appearance. Lateral recess stenosis greater on the right.  L5-S1 multifactorial moderate to marked left foraminal narrowing and encroachment upon exiting left L5 nerve root. Mild right foraminal narrowing. No significant spinal stenosis.    Review of Systems  Constitutional: Negative for chills, fatigue and fever.  Respiratory: Negative for choking, chest tightness, shortness of breath and wheezing.   Cardiovascular: Negative for chest pain and palpitations.  Gastrointestinal: Negative for abdominal pain.  Genitourinary: Negative for dysuria, flank pain and frequency.  Musculoskeletal: Positive for back pain.  Skin: Negative for rash.  Neurological: Negative  for dizziness, tremors, syncope, speech difficulty, numbness and headaches.       Occasional pain radiates down her left leg.  Hematological: Negative for adenopathy. Does not bruise/bleed easily.  Psychiatric/Behavioral: Negative for behavioral problems, confusion, hallucinations, sleep disturbance and suicidal ideas. The patient is not nervous/anxious.     Past Medical History:  Diagnosis Date  . Abnormally small mouth   . Anemia   . Bilateral carotid artery stenosis    Bilateral ICA 40-59%/  >50% LECA  . Breast cancer ([place]) 01/18/2016   right breast  . Breast cancer of upper-inner quadrant of right female breast ([place]) 12/16/2015  . CAD (coronary artery disease) cardiologist-  dr Aundra Dubin   Remote CABG in 1998 with redo in 2009  . CHF (congestive heart failure) (HCC)    EF is low normal at 50 to 55% per echo in Jan. 2011  . GERD (gastroesophageal reflux disease)   . Hearing loss of right ear   . History of non-ST elevation myocardial infarction (NSTEMI)    Sept 2014--  thought to be type II HTN w/ LHC without infarct related artery and patent grafts  . History of radiation therapy 05/24/16-07/26/16   right breast 50.4 Gy in 28 fractions, right breast boost 10 Gy in 5 fractions  . Hyperlipidemia   . Hypertension    Resolved.  . Iron deficiency anemia   . Moderate persistent asthma    pulmologist-  Dr. Malvin Johns  . Nonischemic cardiomyopathy ([place])   . PAF (paroxysmal atrial fibrillation) ([place])   . Personal history of chemotherapy 2017  . Personal history of radiation therapy 2017  . Psoriasis    right leg  .  Renal calculus, right   . Renal insufficiency, mild   . S/P AVR    prosthesis valve placement 2009 at same time re-do CABG  . 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 Type 2 diabetes mellitus ([place])    monitored by dr Dwyane Dee     Social History   Socioeconomic History  . Marital status: Married    Spouse name: Not on file  . Number of children: 2  . Years of education:  Not on file  . Highest education level: Not on file  Occupational History  . Occupation: retired  Scientific laboratory technician  . Financial resource strain: Not on file  . Food insecurity:    Worry: Not on file    Inability: Not on file  . Transportation needs:    Medical: Not on file    Non-medical: Not on file  Tobacco Use  . Smoking status: Never Smoker  . Smokeless tobacco: Never Used  Substance and Sexual Activity  . Alcohol use: No  . Drug use: No  . Sexual activity: Yes    Birth control/protection: None  Lifestyle  . Physical activity:    Days per week: Not on file    Minutes per session: Not on file  . Stress: Not on file  Relationships  . Social connections:    Talks on phone: Not on file    Gets together: Not on file    Attends religious service: Not on file    Active member of club or organization: Not on file    Attends meetings of clubs or organizations: Not on file    Relationship status: Not on file  . Intimate partner violence:    Fear of current or ex partner: Not on file    Emotionally abused: Not on file    Physically abused: Not on file    Forced sexual activity: Not on file  Other Topics Concern  . Not on file  Social History Narrative  . Not on file    Past Surgical History:  Procedure Laterality Date  . AORTIC VALVE [facility]ACEMENT  [facility]   [facility] is [facility]  . BREAST LUMPECTOMY Right 01/18/2016  . BREAST LUMPECTOMY WITH RADIOACTIVE SEED AND SENTINEL LYMPH NODE BIOPSY Right 01/18/2016   Procedure: RIGHT BREAST LUMPECTOMY WITH RADIOACTIVE SEED AND SENTINEL LYMPH NODE BIOPSY;  Surgeon: DAlphonsa Overall MD;  [facility]  Service: General;  Laterality: Right;  . CARDIAC CATHETERIZATION  03/23/2008   Pre-redo CABG: L main OK, LAD (T), CFX (T), OM1 99%, RCA (T), LIMA-LAD OK, SVG-OM(?3) OK w/ little florw to OM2, SVG-RCA OK. EF NL  . CARPAL TUNNEL RELEASE    . COLONOSCOPY    . CORONARY ARTERY BYPASS GRAFT  1998 &   re-do 2009   Had LIMA to DX/LAD, SVG to 2 marginal branches [facility]; SVG to 3rd OM and PD at time of redo  . CYSTOSCOPY W/ URETERAL STENT PLACEMENT Right 12/20/2014   Procedure: CYSTOSCOPY WITH RETROGRADE PYELOGRAM/URETERAL STENT PLACEMENT;  Surgeon: PCleon Gustin MD;  [facility]  Service: Urology;  Laterality: Right;  . EYE SURGERY Bilateral    cataracts  . HOLMIUM LASER APPLICATION Right 78/34/1962  Procedure:  HOLMIUM LASER LITHOTRIPSY;  Surgeon: PCleon Gustin MD;  [facility]  Service: Urology;  Laterality: Right;  . LEFT HEART CATHETERIZATION WITH CORONARY/GRAFT ANGIOGRAM N/A 02/23/2013   Procedure:  LEFT HEART CATHETERIZATION WITH Beatrix Fetters;  Surgeon: Blane Ohara, MD;  Location: [facility] CATH LAB;  Service: Cardiovascular;  Laterality: N/A;  . LOOP RECORDER INSERTION N/A 08/30/2017   Procedure: LOOP RECORDER INSERTION;  Surgeon: Evans Lance, MD;  Location: [place] CV LAB;  Service: Cardiovascular;  Laterality: N/A;  . PORTACATH PLACEMENT Left 01/18/2016   Procedure: INSERTION PORT-A-CATH;  Surgeon: Alphonsa Overall, MD;  Location: [place];  Service: General;  Laterality: Left;  . portacath removal    . TONSILLECTOMY    . TRANSTHORACIC ECHOCARDIOGRAM  02-24-2013      mild LVH,  ef 50-55%/  AV bioprosthesis was present with very mild stenosis and no regurg., mean grandient 54mHg, peak grandient 25mg /  mild MR/  mild LAE and RAE/  moderate TR  . TUBAL LIGATION      Family History  Problem Relation Age of Onset  . Heart disease Father   . Heart failure Father   . Diabetes Maternal Grandmother   . Heart disease Maternal Grandmother   . Diabetes Son   . Healthy Brother        #1  . Heart attack Brother        #2  . Heart disease Brother        #2    Allergies  Allergen Reactions  . Amoxicillin Rash and Other (See Comments)    Has patient had a PCN reaction causing immediate rash,  facial/tongue/throat swelling, SOB or lightheadedness with hypotension: No Has patient had a PCN reaction causing severe rash involving mucus membranes or skin necrosis: Yes Has patient had a PCN reaction that required hospitalization No Has patient had a PCN reaction occurring within the last 10 years: No If all of the above answers are "NO", then may proceed with Cephalosporin use.   . Imdur [Isosorbide Dinitrate] Other (See Comments)    Headache/severe hypotension/Syncope  . Tape     Must use paper tape  . Arimidex [Anastrozole] Nausea Only  . Latex Itching  . Tetracycline Rash    Current Outpatient Medications on File Prior to Visit  Medication Sig Dispense Refill  . ACCU-CHEK GUIDE test strip Use to test blood sugar 3 times daily  5  . acetaminophen (TYLENOL) 325 MG tablet Take 2 tablets (650 mg total) by mouth every 6 (six) hours as needed for mild pain (or Fever >/= 101).    . Marland Kitchenlbuterol (PROVENTIL HFA;VENTOLIN HFA) 108 (90 Base) MCG/ACT inhaler Inhale 2 puffs into the lungs every 6 (six) hours as needed for wheezing or shortness of breath.    . Marland Kitchenmiodarone (PACERONE) 200 MG tablet Take 1 tablet (200 mg total) by mouth daily. 90 tablet 3  . aspirin EC 81 MG EC tablet Take 1 tablet (81 mg total) by mouth daily. (Patient taking differently: Take 81 mg by mouth every evening. )    . clopidogrel (PLAVIX) 75 MG tablet TAKE 1 TABLET BY MOUTH  DAILY (Patient taking differently: TAKE 1 TABLET (75 MG) BY MOUTH DAILY AFTER BREAKFAST) 90 tablet 3  . Dulaglutide (TRULICITY) 1.5 MGMC/9.4BSOPN Inject contents of pen once a week (Patient taking differently: Inject 1.5 mg into the skin every Saturday. ) 12 pen 1  . exemestane (AROMASIN) 25 MG tablet TAKE ONE TABLET BY MOUTH DAILY AFTER BREAKFAST 90 tablet 3  . ezetimibe (ZETIA) 10 MG tablet Take 1 tablet (10 mg total) by mouth daily. 90 tablet 2  . fludrocortisone (FLORINEF) 0.1 MG tablet Take 1 tablet (0.1 mg  total) by mouth daily. 90 tablet 3  .  furosemide (LASIX) 20 MG tablet Take 1 tablet (20 mg total) by mouth daily as needed for fluid. 30 tablet 0  . Insulin Disposable Pump (V-GO 30) KIT 1 Device by Does not apply route daily. (Patient taking differently: 1 Device by Does not apply route continuous. ) 90 kit 3  . insulin lispro (HUMALOG KWIKPEN) 100 UNIT/ML KiwkPen Inject 2-6 Units into the skin at bedtime as needed (CBG >200).    . insulin lispro (HUMALOG) 100 UNIT/ML injection INJECT 76 UNITS DAILY WITH  V-GO PUMP 70 mL 3  . rosuvastatin (CRESTOR) 40 MG tablet TAKE ONE TABLET BY MOUTH DAILY (Patient taking differently: TAKE ONE TABLET (40 MG) BY MOUTH DAILY AT BEDTIME) 90 tablet 3  . SYMBICORT 160-4.5 MCG/ACT inhaler INHALE TWO PUFFS BY MOUTH TWICE DAILY 10.2 g 2  . zolpidem (AMBIEN) 5 MG tablet Take 1 tablet (5 mg total) by mouth at bedtime as needed for sleep.(can fill on Nov 03, 2017 since she will run out and pharmacy closed on 27th) 30 tablet 0   No current facility-administered medications on file prior to visit.     BP (!) 128/44 (BP Location: Left Arm, Patient Position: Sitting, Cuff Size: Large)   Pulse 82   Temp 98.4 F (36.9 C) (Oral)   Resp 16   Ht _0  (1.626 m)   Wt 200 lb (90.7 kg)   SpO2 100%   BMI 34.33 kg/m       Objective:   Physical Exam  .General Mental Status- Alert. General Appearance- Not in acute distress.   Skin General: Color- Normal Color. Moisture- Normal Moisture.  Neck Carotid Arteries- Normal color. Moisture- Normal Moisture. No carotid bruits. No JVD.  Chest and Lung Exam Auscultation: Breath Sounds:-Normal.  Cardiovascular Auscultation:Rythm- Regular. Murmurs & Other Heart Sounds:Auscultation of the heart reveals- No Murmurs.  Abdomen Inspection:-Inspeection Normal. Palpation/Percussion:Note:No mass. Palpation and Percussion of the abdomen reveal- Non Tender, Non Distended + BS, no rebound or guarding.  Neurologic Cranial Nerve exam:- CN III-XII intact(No nystagmus),  symmetric smile. Strength:- 5/5 equal and symmetric strength both upper and lower extremities.  Lumbar- mild to moderate tenderness to palpation presently. Lower extremity exam- L5-S1 sensation intact.  No foot drop.  No leg weakness.     Assessment & Plan:  For your severe back pain, I am going to prescribe you 50 mg of tramadol.  60 tablets a month to use as needed.  Rx advised and given and reviewed.  We had discussion today about whether or not tramadol had caused you seizures in the past.  Your EEG was negative and in the and those events were determined to be syncopal events related to extreme low blood pressure.  Nevertheless we will proceed with caution.  If any adverse side effects please let me know.  You sign controlled medication contract today.  Please give UDS.  Follow-up in 1 month or as needed.

## 2017-11-21 LAB — PAIN MGMT, PROFILE 8 W/CONF, U
6 Acetylmorphine: NEGATIVE ng/mL (ref <10)
Alcohol Metabolites: NEGATIVE ng/mL (ref <500)
Amphetamines: NEGATIVE ng/mL (ref <500)
Benzodiazepines: NEGATIVE ng/mL (ref <100)
Buprenorphine, Urine: NEGATIVE ng/mL (ref <5)
Cocaine Metabolite: NEGATIVE ng/mL (ref <150)
Creatinine: 84.5 mg/dL
MDMA: NEGATIVE ng/mL (ref <500)
Marijuana Metabolite: NEGATIVE ng/mL (ref <20)
Opiates: NEGATIVE ng/mL (ref <100)
Oxidant: NEGATIVE ug/mL (ref <200)
Oxycodone: NEGATIVE ng/mL (ref <100)
pH: 6.28 (ref 4.5–9.0)

## 2017-11-27 LAB — CUP PACEART REMOTE DEVICE CHECK
Date Time Interrogation Session: 20190527133844
Implantable Pulse Generator Implant Date: 20190322

## 2017-12-02 ENCOUNTER — Ambulatory Visit (INDEPENDENT_AMBULATORY_CARE_PROVIDER_SITE_OTHER): Payer: Medicare Other | Admitting: Internal Medicine

## 2017-12-02 ENCOUNTER — Encounter: Payer: Self-pay | Admitting: Internal Medicine

## 2017-12-02 VITALS — BP 126/68 | HR 85 | Ht 65.0 in | Wt 197.0 lb

## 2017-12-02 DIAGNOSIS — I959 Hypotension, unspecified: Secondary | ICD-10-CM

## 2017-12-02 DIAGNOSIS — I471 Supraventricular tachycardia: Secondary | ICD-10-CM | POA: Diagnosis not present

## 2017-12-02 DIAGNOSIS — I480 Paroxysmal atrial fibrillation: Secondary | ICD-10-CM

## 2017-12-02 DIAGNOSIS — I951 Orthostatic hypotension: Secondary | ICD-10-CM

## 2017-12-02 DIAGNOSIS — R55 Syncope and collapse: Secondary | ICD-10-CM

## 2017-12-02 DIAGNOSIS — Z951 Presence of aortocoronary bypass graft: Secondary | ICD-10-CM

## 2017-12-02 LAB — CUP PACEART INCLINIC DEVICE CHECK
Date Time Interrogation Session: 20190624170737
Implantable Pulse Generator Implant Date: 20190322

## 2017-12-02 MED ORDER — METOPROLOL TARTRATE 25 MG PO TABS: ORAL_TABLET | ORAL | 3 refills | Status: DC

## 2017-12-02 MED ORDER — APIXABAN 5 MG PO TABS: 5.0000 mg | ORAL_TABLET | Freq: Two times a day (BID) | ORAL | 0 refills | Status: DC

## 2017-12-02 MED ORDER — APIXABAN 5 MG PO TABS: 5.0000 mg | ORAL_TABLET | Freq: Two times a day (BID) | ORAL | 11 refills | Status: DC

## 2017-12-02 NOTE — Progress Notes (Signed)
HPI Ms. Kilbride returns today for followup of syncope, s/p ILR insertion. SHe has HTN. She was found on her ILR to have atrial fib. Her CHADSVASC score is 4. She has occaisional palpitations. She has a h/o AVR with a tissue valve. She has not had syncope. No edema. No chest pain. She has occaisional palpitations.  Allergies  Allergen Reactions  . Amoxicillin Rash and Other (See Comments)    Has patient had a PCN reaction causing immediate rash, facial/tongue/throat swelling, SOB or lightheadedness with hypotension: No Has patient had a PCN reaction causing severe rash involving mucus membranes or skin necrosis: Yes Has patient had a PCN reaction that required hospitalization No Has patient had a PCN reaction occurring within the last 10 years: No If all of the above answers are "NO", then may proceed with Cephalosporin use.   . Imdur [Isosorbide Dinitrate] Other (See Comments)    Headache/severe hypotension/Syncope  . Tape     Must use paper tape  . Arimidex [Anastrozole] Nausea Only  . Latex Itching  . Tetracycline Rash     Current Outpatient Medications  Medication Sig Dispense Refill  . ACCU-CHEK GUIDE test strip Use to test blood sugar 3 times daily  5  . acetaminophen (TYLENOL) 325 MG tablet Take 2 tablets (650 mg total) by mouth every 6 (six) hours as needed for mild pain (or Fever >/= 101).    Marland Kitchen albuterol (PROVENTIL HFA;VENTOLIN HFA) 108 (90 Base) MCG/ACT inhaler Inhale 2 puffs into the lungs every 6 (six) hours as needed for wheezing or shortness of breath.    Marland Kitchen amiodarone (PACERONE) 200 MG tablet Take 1 tablet (200 mg total) by mouth daily. 90 tablet 3  . Dulaglutide (TRULICITY) 1.5 OZ/3.0QM SOPN Inject contents of pen once a week (Patient taking differently: Inject 1.5 mg into the skin every Saturday. ) 12 pen 1  . exemestane (AROMASIN) 25 MG tablet TAKE ONE TABLET BY MOUTH DAILY AFTER BREAKFAST 90 tablet 3  . ezetimibe (ZETIA) 10 MG tablet Take 1 tablet (10 mg total)  by mouth daily. 90 tablet 2  . fludrocortisone (FLORINEF) 0.1 MG tablet Take 1 tablet (0.1 mg total) by mouth daily. 90 tablet 3  . furosemide (LASIX) 20 MG tablet Take 1 tablet (20 mg total) by mouth daily as needed for fluid. 30 tablet 0  . Insulin Disposable Pump (V-GO 30) KIT 1 Device by Does not apply route daily. (Patient taking differently: 1 Device by Does not apply route continuous. ) 90 kit 3  . insulin lispro (HUMALOG KWIKPEN) 100 UNIT/ML KiwkPen Inject 2-6 Units into the skin at bedtime as needed (CBG >200).    . insulin lispro (HUMALOG) 100 UNIT/ML injection INJECT 76 UNITS DAILY WITH  V-GO PUMP 70 mL 3  . rosuvastatin (CRESTOR) 40 MG tablet TAKE ONE TABLET BY MOUTH DAILY (Patient taking differently: TAKE ONE TABLET (40 MG) BY MOUTH DAILY AT BEDTIME) 90 tablet 3  . SYMBICORT 160-4.5 MCG/ACT inhaler INHALE TWO PUFFS BY MOUTH TWICE DAILY 10.2 g 2  . traMADol (ULTRAM) 50 MG tablet Take 1 tablet (50 mg total) by mouth every 12 (twelve) hours as needed for severe pain. 30 tablet 0  . zolpidem (AMBIEN) 5 MG tablet Take 1 tablet (5 mg total) by mouth at bedtime as needed for sleep.(can fill on Nov 03, 2017 since she will run out and pharmacy closed on 27th) 30 tablet 0  . apixaban (ELIQUIS) 5 MG TABS tablet Take 1 tablet (5 mg  total) by mouth 2 (two) times daily. 60 tablet 11  . metoprolol tartrate (LOPRESSOR) 25 MG tablet Take one tablet as needed for palpitations. 30 tablet 3   No current facility-administered medications for this visit.      Past Medical History:  Diagnosis Date  . Abnormally small mouth   . Anemia   . Bilateral carotid artery stenosis    Bilateral ICA 40-59%/  >50% LECA  . Breast cancer ([place]) 01/18/2016   right breast  . Breast cancer of upper-inner quadrant of right female breast ([place]) 12/16/2015  . CAD (coronary artery disease) cardiologist-  dr Aundra Dubin   Remote CABG in 1998 with redo in 2009  . CHF (congestive heart failure) (HCC)    EF is low normal at 50 to  55% per echo in Jan. 2011  . GERD (gastroesophageal reflux disease)   . Hearing loss of right ear   . History of non-ST elevation myocardial infarction (NSTEMI)    Sept 2014--  thought to be type II HTN w/ LHC without infarct related artery and patent grafts  . History of radiation therapy 05/24/16-07/26/16   right breast 50.4 Gy in 28 fractions, right breast boost 10 Gy in 5 fractions  . Hyperlipidemia   . Hypertension    Resolved.  . Iron deficiency anemia   . Moderate persistent asthma    pulmologist-  Dr. Malvin Johns  . Nonischemic cardiomyopathy ([place])   . PAF (paroxysmal atrial fibrillation) ([place])   . Personal history of chemotherapy 2017  . Personal history of radiation therapy 2017  . Psoriasis    right leg  . Renal calculus, right   . Renal insufficiency, mild   . S/P AVR    prosthesis valve placement 2009 at same time re-do CABG  . 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 Type 2 diabetes mellitus ([place])    monitored by dr Dwyane Dee    ROS:   All systems reviewed and negative except as noted in the HPI.   Past Surgical History:  Procedure Laterality Date  . AORTIC VALVE REPLACEMENT  2009   [facility]done same time is CABG)  . BREAST LUMPECTOMY Right 01/18/2016  . BREAST LUMPECTOMY WITH RADIOACTIVE SEED AND SENTINEL LYMPH NODE BIOPSY Right 01/18/2016   Procedure: RIGHT BREAST LUMPECTOMY WITH RADIOACTIVE SEED AND SENTINEL LYMPH NODE BIOPSY;  Surgeon: DAlphonsa Overall MD;  [facility]  Service: General;  Laterality: Right;  . CARDIAC CATHETERIZATION  03/23/2008   Pre-redo CABG: L main OK, LAD (T), CFX (T), OM1 99%, RCA (T), LIMA-LAD OK, SVG-OM(?3) OK w/ little florw to OM2, SVG-RCA OK. EF NL  . CARPAL TUNNEL RELEASE    . COLONOSCOPY    . CORONARY ARTERY BYPASS GRAFT  1998 &  re-do 2009   Had LIMA to DX/LAD, SVG to 2 marginal branches and [facility]; SVG to 3rd OM and PD at time of redo  . CYSTOSCOPY W/ URETERAL STENT PLACEMENT  Right 12/20/2014   Procedure: CYSTOSCOPY WITH RETROGRADE PYELOGRAM/URETERAL STENT PLACEMENT;  Surgeon: PCleon Gustin MD;  [facility]  Service: Urology;  Laterality: Right;  . EYE SURGERY Bilateral    cataracts  . HOLMIUM LASER APPLICATION Right [id number]  Procedure:  HOLMIUM LASER LITHOTRIPSY;  Surgeon: PCleon Gustin MD;  [facility]  Service: Urology;  Laterality: Right;  . LEFT HEART CATHETERIZATION WITH CORONARY/GRAFT ANGIOGRAM N/A 02/23/2013   Procedure: LEFT HEART  CATHETERIZATION WITH Beatrix Fetters;  Surgeon: Blane Ohara, MD;  [facility] C[facility] LAB;  Service: Cardiovascular;  Laterality: N/A;  . LOOP RECORDER INSERTION N/A 08/30/2017   Procedure: LOOP RECORDER INSERTION;  Surgeon: Evans Lance, MD;  Location: [place] CV LAB;  Service: Cardiovascular;  Laterality: N/A;  . PORTACATH PLACEMENT Left 01/18/2016   Procedure: INSERTION PORT-A-CATH;  Surgeon: Alphonsa Overall, MD;  Location: [place];  Service: General;  Laterality: Left;  . portacath removal    . TONSILLECTOMY    . TRANSTHORACIC ECHOCARDIOGRAM  02-24-2013      mild LVH,  ef 50-55%/  AV bioprosthesis was present with very mild stenosis and no regurg., mean grandient 47mHg, peak grandient 272mg /  mild MR/  mild LAE and RAE/  moderate TR  . TUBAL LIGATION       Family History  Problem Relation Age of Onset  . Heart disease Father   . Heart failure Father   . Diabetes Maternal Grandmother   . Heart disease Maternal Grandmother   . Diabetes Son   . Healthy Brother        #1  . Heart attack Brother        #2  . Heart disease Brother        #2     Social History   Socioeconomic History  . Marital status: Married    Spouse name: Not on file  . Number of children: 2  . Years of education: Not on file  . Highest education level: Not on file  Occupational History  . Occupation: retired  [employer] laboratory technician. Financial resource strain: Not on  file  . Food insecurity:    Worry: Not on file    Inability: Not on file  . Transportation needs:    Medical: Not on file    Non-medical: Not on file  Tobacco Use  . Smoking status: Never Smoker  . Smokeless tobacco: Never Used  Substance and Sexual Activity  . Alcohol use: No  . Drug use: No  . Sexual activity: Yes    Birth control/protection: None  Lifestyle  . Physical activity:    Days per week: Not on file    Minutes per session: Not on file  . Stress: Not on file  Relationships  . Social connections:    Talks on phone: Not on file    Gets together: Not on file    Attends religious service: Not on file    Active member of club or organization: Not on file    Attends meetings of clubs or organizations: Not on file    Relationship status: Not on file  . Intimate partner violence:    Fear of current or ex partner: Not on file    Emotionally abused: Not on file    Physically abused: Not on file    Forced sexual activity: Not on file  Other Topics Concern  . Not on file  Social History Narrative  . Not on file     BP 126/68   Pulse 85   Ht 5' 5"  (1.651 m)   Wt 197 lb (89.4 kg)   BMI 32.78 kg/m   Physical Exam:  Well appearing [age]o woman, NAD HEENT: Unremarkable Neck:  No JVD, no thyromegally Lymphatics:  No adenopathy Back:  No CVA tenderness Lungs:  Clear with no wheezes HEART:  Regular rate rhythm, no murmurs, no rubs, no clicks Abd:  soft, positive bowel sounds, no organomegally, no rebound,  no guarding Ext:  2 plus pulses, no edema, no cyanosis, no clubbing Skin:  No rashes no nodules Neuro:  CN II through XII intact, motor grossly intact  EKG - NSR with NSSTT changes  DEVICE  Normal device function.  See PaceArt for details. 2 episodes of atrial fib  Assess/Plan: 1. PAF - she is on ASA and plavix. I have recommended she stop both and start Eliquis 5 bid.  2. HTN - her blood pressure is controlled today. She will continue her current meds. 3.  Chronic diastolic heart failure - her symptoms are class 2. She will continue her current meds. She is encouraged to reduce her salt intake.  Hannah [name].D.

## 2017-12-02 NOTE — Patient Instructions (Addendum)
Medication Instructions:  Your physician has recommended you make the following change in your medication:   1.  Take metoprolol tartrate 25 mg one tablet as needed for palpitations.  2.  Take your aspirin and Plavix tonight and then STOP.  3.  Tonight start taking Eliquis 5 mg one tablet by mouth twice a day.  Labwork: You will get lab work in one month:  BMP and CBC.  Testing/Procedures: None ordered.  Follow-Up: Your physician wants you to follow-up in: 6 months with Dr. Lovena Le.   You will receive a reminder letter in the mail two months in advance. If you don't receive a letter, please call our office to schedule the follow-up appointment.  Any Other Special Instructions Will Be Listed Below (If Applicable).  If you need a refill on your cardiac medications before your next appointment, please call your pharmacy.   Apixaban oral tablets What is this medicine? APIXABAN (a PIX a ban) is an anticoagulant (blood thinner). It is used to lower the chance of stroke in people with a medical condition called atrial fibrillation. It is also used to treat or prevent blood clots in the lungs or in the veins. This medicine may be used for other purposes; ask your health care provider or pharmacist if you have questions. COMMON BRAND NAME(S): Eliquis What should I tell my health care provider before I take this medicine? They need to know if you have any of these conditions: -bleeding disorders -bleeding in the brain -blood in your stools (black or tarry stools) or if you have blood in your vomit -history of stomach bleeding -kidney disease -liver disease -mechanical heart valve -an unusual or allergic reaction to apixaban, other medicines, foods, dyes, or preservatives -pregnant or trying to get pregnant -breast-feeding How should I use this medicine? Take this medicine by mouth with a glass of water. Follow the directions on the prescription label. You can take it with or without  food. If it upsets your stomach, take it with food. Take your medicine at regular intervals. Do not take it more often than directed. Do not stop taking except on your doctor's advice. Stopping this medicine may increase your risk of a blot clot. Be sure to refill your prescription before you run out of medicine. Talk to your pediatrician regarding the use of this medicine in children. Special care may be needed. Overdosage: If you think you have taken too much of this medicine contact a poison control center or emergency room at once. NOTE: This medicine is only for you. Do not share this medicine with others. What if I miss a dose? If you miss a dose, take it as soon as you can. If it is almost time for your next dose, take only that dose. Do not take double or extra doses. What may interact with this medicine? This medicine may interact with the following: -aspirin and aspirin-like medicines -certain medicines for fungal infections like ketoconazole and itraconazole -certain medicines for seizures like carbamazepine and phenytoin -certain medicines that treat or prevent blood clots like warfarin, enoxaparin, and dalteparin -clarithromycin -NSAIDs, medicines for pain and inflammation, like ibuprofen or naproxen -rifampin -ritonavir -St. John's wort This list may not describe all possible interactions. Give your health care provider a list of all the medicines, herbs, non-prescription drugs, or dietary supplements you use. Also tell them if you smoke, drink alcohol, or use illegal drugs. Some items may interact with your medicine. What should I watch for while using this medicine? Visit  your doctor or health care professional for regular checks on your progress. Notify your doctor or health care professional and seek emergency treatment if you develop breathing problems; changes in vision; chest pain; severe, sudden headache; pain, swelling, warmth in the leg; trouble speaking; sudden numbness  or weakness of the face, arm or leg. These can be signs that your condition has gotten worse. If you are going to have surgery or other procedure, tell your doctor that you are taking this medicine. What side effects may I notice from receiving this medicine? Side effects that you should report to your doctor or health care professional as soon as possible: -allergic reactions like skin rash, itching or hives, swelling of the face, lips, or tongue -signs and symptoms of bleeding such as bloody or black, tarry stools; red or dark-brown urine; spitting up blood or brown material that looks like coffee grounds; red spots on the skin; unusual bruising or bleeding from the eye, gums, or nose This list may not describe all possible side effects. Call your doctor for medical advice about side effects. You may report side effects to FDA at 1-800-FDA-1088. Where should I keep my medicine? Keep out of the reach of children. Store at room temperature between 20 and 25 degrees C (68 and 77 degrees F). Throw away any unused medicine after the expiration date. NOTE: This sheet is a summary. It may not cover all possible information. If you have questions about this medicine, talk to your doctor, pharmacist, or health care provider.  2018 Elsevier/Gold Standard (2015-12-19 11:54:23)

## 2017-12-03 ENCOUNTER — Encounter: Payer: Self-pay | Admitting: Medical

## 2017-12-04 MED ORDER — ZOLPIDEM TARTRATE 5 MG PO TABS
ORAL_TABLET | ORAL | 0 refills | Status: DC
Start: 2017-12-04 — End: 2018-01-01

## 2017-12-04 NOTE — Telephone Encounter (Signed)
Please advise in PCP's absence?   Last Ambien RX: 10/30/17, #30 Last OV: 11/20/17 Next OV: due 12/20/17 but not yet scheduled UDS: 11/20/17 CSC: 11/20/17 CSR: No discrepancies identified

## 2017-12-09 ENCOUNTER — Ambulatory Visit (INDEPENDENT_AMBULATORY_CARE_PROVIDER_SITE_OTHER): Payer: Medicare Other | Admitting: *Deleted

## 2017-12-09 ENCOUNTER — Telehealth: Payer: Self-pay | Admitting: Cardiology

## 2017-12-09 DIAGNOSIS — R55 Syncope and collapse: Secondary | ICD-10-CM | POA: Diagnosis not present

## 2017-12-09 MED ORDER — METOPROLOL TARTRATE 25 MG PO TABS: ORAL_TABLET | ORAL | 3 refills | Status: DC

## 2017-12-09 NOTE — Telephone Encounter (Signed)
Manual transmission reviewed. Episodes were (1) SR @100bpm , (1)irreg RR in 90's. +chest tightness and palps. Patient states that she was sitting down at the time of the episodes. She states that she was prescribed Lopressor to take PRN, but she said that she would like clarification about the frequency and the max that she can take in a day.   Will forward to Dr.Taylor and Myrtie Hawk, RN for further recommendations.

## 2017-12-09 NOTE — Telephone Encounter (Signed)
Per Dr. Sherron Ales Pt f/u with LG.

## 2017-12-09 NOTE — Telephone Encounter (Signed)
Patient called and stated that she used her symptom activator on Sunday 12-08-2017 on two separate occasions. Pt stated that both times was a racing heart and she her chest felt tight and very uncomfortable. She stated that she took her Metoprolol around 3:30 PM and another one at 9:30 PM and it did not feel like it helped much at all. She also wants further instructions about how to take her Metoprolol.

## 2017-12-09 NOTE — Progress Notes (Signed)
[facility] Summary Report / Loop Recorder 

## 2017-12-09 NOTE — Telephone Encounter (Signed)
S/w pt needed clarification on Toprol, how many time pt can take in one day.  This medication is short acting and can be taken up to 4 times daily, 6 hours apart or two tablets at a time with up to 6 hours apart.  If episodes continue more frequent pt needs to be seen.  For peace of mind pt is scheduled for an ov in 3 weeks. If pt is doing ok will call and cancel and keep august appt. Will update medication list.

## 2017-12-09 NOTE — Telephone Encounter (Signed)
Remote transmission reviewed. Presenting rhythm: SR. ECG for (1) symptom episode reviewed - SR @~100bpm.  Teofilo Pod, CMA to call patient to send a manual transmission so second symptom episode can be reviewed.

## 2017-12-13 ENCOUNTER — Ambulatory Visit: Payer: Medicare Other | Admitting: Endocrinology

## 2017-12-23 ENCOUNTER — Telehealth: Payer: Self-pay | Admitting: *Deleted

## 2017-12-23 ENCOUNTER — Other Ambulatory Visit: Payer: Medicare Other | Admitting: *Deleted

## 2017-12-23 DIAGNOSIS — I480 Paroxysmal atrial fibrillation: Secondary | ICD-10-CM

## 2017-12-23 LAB — CBC WITH DIFFERENTIAL/PLATELET
Basophils Absolute: 0.1 10*3/uL (ref 0.0–0.2)
Basos: 1 %
EOS (ABSOLUTE): 0.3 10*3/uL (ref 0.0–0.4)
Eos: 4 %
Hematocrit: 33.6 % — ABNORMAL LOW (ref 34.0–46.6)
Hemoglobin: 10.8 g/dL — ABNORMAL LOW (ref 11.1–15.9)
Immature Grans (Abs): 0 10*3/uL (ref 0.0–0.1)
Immature Granulocytes: 0 %
Lymphocytes Absolute: 0.9 10*3/uL (ref 0.7–3.1)
Lymphs: 12 %
MCH: 30.8 pg (ref 26.6–33.0)
MCHC: 32.1 g/dL (ref 31.5–35.7)
MCV: 96 fL (ref 79–97)
Monocytes Absolute: 0.7 10*3/uL (ref 0.1–0.9)
Monocytes: 9 %
Neutrophils Absolute: 5.3 10*3/uL (ref 1.4–7.0)
Neutrophils: 74 %
Platelets: 154 10*3/uL (ref 150–450)
RBC: 3.51 x10E6/uL — ABNORMAL LOW (ref 3.77–5.28)
RDW: 13.2 % (ref 12.3–15.4)
WBC: 7.2 10*3/uL (ref 3.4–10.8)

## 2017-12-23 LAB — BASIC METABOLIC PANEL
BUN/Creatinine Ratio: 13 (ref 12–28)
BUN: 25 mg/dL (ref 8–27)
CO2: 24 mmol/L (ref 20–29)
Calcium: 9.2 mg/dL (ref 8.7–10.3)
Chloride: 101 mmol/L (ref 96–106)
Creatinine, Ser: 1.95 mg/dL — ABNORMAL HIGH (ref 0.57–1.00)
GFR calc Af Amer: 29 mL/min/{1.73_m2} — ABNORMAL LOW (ref >59)
GFR calc non Af Amer: 25 mL/min/{1.73_m2} — ABNORMAL LOW (ref >59)
Glucose: 154 mg/dL — ABNORMAL HIGH (ref 65–99)
Potassium: 4.5 mmol/L (ref 3.5–5.2)
Sodium: 141 mmol/L (ref 134–144)

## 2017-12-23 NOTE — Telephone Encounter (Signed)
S/w pt is coming in on 7/17 for appt with [name]. Dr. Lovena Le in office.

## 2017-12-23 NOTE — Telephone Encounter (Signed)
S/w to pt in office for lab work.  Hannah [name] will t/w Dr. Lovena Le on Wednesday will call pt back on Wednesday. Pt is agreeable to plan. Will keep in box till reviewed.

## 2017-12-23 NOTE — Telephone Encounter (Signed)
Tell her I would like to talk with Dr. Lovena Le first - he will be here on Wednesday and then I will get back to her.

## 2017-12-23 NOTE — Telephone Encounter (Signed)
Manual transmission received.  2 recorded symptom episode ECGs from 7/9 and 7/14 appear NSR.  ECGs printed and given to Truitt Merle, NP for review (see phone note).

## 2017-12-23 NOTE — Telephone Encounter (Signed)
Hannah [name] calling to make [place] aware of 2 symptom episodes marked this weekend for longer episodes of AF. I instructed her to send a manual transmission. She will be in the office at 1pm for labs if we need to speak with her.

## 2017-12-23 NOTE — Telephone Encounter (Signed)
Pt calling today due to being lightheaded with low bp, Sat 100/48, was given florinef (0.1 mg ) due to low bp, pt wanted to know if dose needs to be increased.?? Pt also stated episodes of A-fib are now lasting 4-5 hours, medication ( metoprolol ) not helping.  Pt will be in office today at 11 am to have  blood work for [employer] will send to [place] to advise.  Pt will ask for Tyrina Hines when pt comes for blood work to get advice of phone note.

## 2017-12-25 ENCOUNTER — Ambulatory Visit (INDEPENDENT_AMBULATORY_CARE_PROVIDER_SITE_OTHER): Payer: Medicare Other | Admitting: Nurse Practitioner

## 2017-12-25 ENCOUNTER — Encounter: Payer: Self-pay | Admitting: Nurse Practitioner

## 2017-12-25 VITALS — BP 150/70 | HR 83 | Ht 65.0 in | Wt 193.0 lb

## 2017-12-25 DIAGNOSIS — I480 Paroxysmal atrial fibrillation: Secondary | ICD-10-CM

## 2017-12-25 DIAGNOSIS — Z952 Presence of prosthetic heart valve: Secondary | ICD-10-CM | POA: Diagnosis not present

## 2017-12-25 DIAGNOSIS — I5032 Chronic diastolic (congestive) heart failure: Secondary | ICD-10-CM

## 2017-12-25 DIAGNOSIS — Z79899 Other long term (current) drug therapy: Secondary | ICD-10-CM | POA: Diagnosis not present

## 2017-12-25 DIAGNOSIS — I2510 Atherosclerotic heart disease of native coronary artery without angina pectoris: Secondary | ICD-10-CM | POA: Diagnosis not present

## 2017-12-25 DIAGNOSIS — I951 Orthostatic hypotension: Secondary | ICD-10-CM

## 2017-12-25 MED ORDER — AMIODARONE HCL 200 MG PO TABS: 200.0000 mg | ORAL_TABLET | Freq: Two times a day (BID) | ORAL | 3 refills | Status: DC

## 2017-12-25 NOTE — Progress Notes (Signed)
CARDIOLOGY OFFICE NOTE  Date:  12/25/2017    Hannah [name] Date of Birth: 10/29/1945 Medical Record #[id number]  PCP:  Mackie Pai, PA-C  Cardiologist:  Atilano Median    Chief Complaint  Patient presents with  . Coronary Artery Disease  . Atrial Fibrillation    Work in visit - seen for Dr. Lovena Le    History of Present Illness: Hannah [name] is a 72 y.o. female who presents today for a work in visit. She is a former patient of Dr. Susa Simmonds. Seen for Dr. Aundra Dubin. She now follows with meand Dr. Lovena Le.   She has a history of CAD s/p CABG, bioprosthetic AVR, CKD, & paroxysmal atrial fibrillation.  She had her initial CABG in 1998, then had redo CABG in 2009 with bioprosthetic AVR. She does not remember when she last had documented atrial fibrillation, but she thinks it was a number of years ago, sounded peri-operative around the time of her redo CABG. She was never put on anticoagulation other than Plavix but had been on dronedarone in the past.   Over the years she has had intermittent issues. These have included tachypalpitations and chest pain. Last cath in 2014 - grafts patent. Myoview in 2015 without ischemia. Event monitor in 8/16 showed atrial tachycardia versus atypical atrial flutter. Toprol XL was increased.  She saw Dr Lovena Le who thought that she had short runs of atrial tachycardia and not atrial flutter. Therefore, she has not been anticoagulated until here recently.   Wasdiagnosed with breast cancerin 2017/2018. Has had surgery.Gotchemo/radiation - no Adriamycin due to her history of CHF. Her course was complicated by volume overload and anemia.  In January of 2018began to have orthostasis. She was placed on midodrine and her lasix was changed to as needed only.ACE and beta blocker stopped.Ended up getting on amiodarone in October of 2018 for palpitations/atrial arrhythmias. I  saw her in December - she was doing ok - BP started tracking way back  up with the Midodrine and we adjusted her medicines. She had a spell of syncope earlier this year and fell - broke her shoulder and sustained a large hematoma. She now has a loop in place.   She was admitted twice in March - recurrent syncope - her loop has been ok. EEG without seizure noted. She was placed on Florinef. She was taken off her Ultram and Gabapentin. She was not using Lasix.   I last saw her in April - she was feeling better. Saw Dr. Lovena Le in June - some PAF noted on her ILR - she was placed on Eliquis - aspirin/Plavix were stopped.   She has called a couple of times since - has had return of palpitations - needing to take more Metoprolol - did not know how much she could take in a 24 hour period, etc. Spoke with her on Monday - more lightheaded, more of what she thought was AF but interpretation of the loop did not confirm. Subsequently asked to come in.   Comes in today. Here with her daughter today. She is quite upset and frustrated. She has had daily episodes - sometimes for several hours at a time where she feels her heart beating fast and irregular. This causes her great distress and associated chest pressure. She has been using Metoprolol - up to 4 - every day. This occurs with and without exertion. It stops and starts without any warning or provocation. She admits she gets quite anxious.   Her  monitor has been reviewed and does not correlate with her symptoms.   PMH: 1. Diabetes mellitus 2. HTN 3. Obesity 4. Atrial arrhythmias: Paroxysmal atrial fibrillation seems to have been post-operative after CAD-AVR. She has a history of amiodarone toxicity. She has not been on coumadin. 3-week event monitor (9/13) with no atrial fibrillation. 3-week event monitor (10/14) with runs of probable atrial tachycardia. 30 day monitor in 8/16 with atrial tachycardia (multiple episodes).  5. CAD: CABG 1998 with LIMA-LAD/diagonal, sequential SVG-OM1/OM2, SVG-RCA. Redo CABG 2009 with  SVG-OM3, SVG-PDA. Lexiscan myoview (9/13): EF 61%, no ischemia or infarction. LHC (10/14) with all grafts patent except the old SVG-RCA. There was 90% stenosis in the AV LCx before ungrafted MOM, but this was unchanged from prior study. Lexiscan Cardiolite (1/15) with no ischemia. She does not tolerate Imdur.  6. CKD 7. Bioprosthetic aortic valve: Echo (2011) with EF 50-55%, bioprosthetic aortic valve well-seated. Echo (2/13) with EF 60-65%, bioprosthetic aortic valve looked ok. Echo (9/14): EF 50-55%, mild LVH, bioprosthetic AVR with mean gradient 10, mild MR, moderate TR.  8. Asthma: since her teens. 9. Low back pain.  10. 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 Per neurologist, did not appear cardioembolic.  11. Carotid stenosis: 40-59% bilateral ICA stenosis on 10/14 carotid dopplers. Carotid doppler (11/15) with 40-59% bilateral ICA stenosis. Carotid dopplers (11/16) with 40-59% BICA stenosis.  12. Sleep study 12/14 without significant OSA.  13. Chronic diastolic CHF    Past Medical History:  Diagnosis Date  . Abnormally small mouth   . Anemia   . Bilateral carotid artery stenosis    Bilateral ICA 40-59%/  >50% LECA  . Breast cancer ([place]) 01/18/2016   right breast  . Breast cancer of upper-inner quadrant of right female breast ([place]) 12/16/2015  . CAD (coronary artery disease) cardiologist-  dr Aundra Dubin   Remote CABG in 1998 with redo in 2009  . CHF (congestive heart failure) (HCC)    EF is low normal at 50 to 55% per echo in Jan. 2011  . GERD (gastroesophageal reflux disease)   . Hearing loss of right ear   . History of non-ST elevation myocardial infarction (NSTEMI)    Sept 2014--  thought to be type II HTN w/ LHC without infarct related artery and patent grafts  . History of radiation therapy 05/24/16-07/26/16   right breast 50.4 Gy in 28 fractions, right breast boost 10 Gy in 5 fractions  . Hyperlipidemia   . Hypertension    Resolved.  . Iron deficiency anemia   .  Moderate persistent asthma    pulmologist-  Dr. Malvin Johns  . Nonischemic cardiomyopathy ([place])   . PAF (paroxysmal atrial fibrillation) ([place])   . Personal history of chemotherapy 2017  . Personal history of radiation therapy 2017  . Psoriasis    right leg  . Renal calculus, right   . Renal insufficiency, mild   . S/P AVR    prosthesis valve placement 2009 at same time re-do CABG  . Stroke [facility])    residual rt hearing loss  . Type 2 diabetes mellitus ([place])    monitored by dr Dwyane Dee    Past Surgical History:  Procedure Laterality Date  . AORTIC VALVE RE[facility]ACEMENT  [facility]   [facility]done same time is [facility]G)  . BREAST LUMPECTOMY Right 01/18/2016  . BREAST LUMPECTOMY WITH RADIOACTIVE SEED AND SENTINEL LYMPH NODE BIOPSY Right 01/18/2016   Procedure: RIGHT BREAST LUMPECTOMY WITH RADIOACTIVE SEED AND SENTINEL LYMPH  NODE BIOPSY;  Surgeon: Alphonsa Overall, MD;  Location: [place];  Service: General;  Laterality: Right;  . CARDIAC CATHETERIZATION  03/23/2008   Pre-redo CABG: L main OK, LAD (T), CFX (T), OM1 99%, RCA (T), LIMA-LAD OK, SVG-OM(?3) OK w/ little florw to OM2, SVG-RCA OK. EF NL  . CARPAL TUNNEL RELEASE    . COLONOSCOPY    . CORONARY ARTERY BYPASS GRAFT  1998 &  re-do 2009   Had LIMA to DX/LAD, SVG to 2 marginal branches and SVG to [facility] originally; SVG to 3rd OM and PD at time of redo  . CYSTOSCOPY W/ URETERAL STENT PLACEMENT Right 12/20/2014   Procedure: CYSTOSCOPY WITH RETROGRADE PYELOGRAM/URETERAL STENT PLACEMENT;  Surgeon: Cleon Gustin, MD;  Location: [facility];  Service: Urology;  Laterality: Right;  . EYE SURGERY Bilateral    cataracts  . HOLMIUM LASER APPLICATION Right 4/00/8676   Procedure:  HOLMIUM LASER LITHOTRIPSY;  Surgeon: Cleon Gustin, MD;  Location: [facility];  Service: Urology;  Laterality: Right;  . LEFT HEART CATHETERIZATION WITH CORONARY/GRAFT ANGIOGRAM N/A 02/23/2013   Procedure: LEFT HEART  CATHETERIZATION WITH Beatrix Fetters;  Surgeon: Blane Ohara, MD;  Location: [facility] CATH LAB;  Service: Cardiovascular;  Laterality: N/A;  . LOOP RECORDER INSERTION N/A 08/30/2017   Procedure: LOOP RECORDER INSERTION;  Surgeon: Evans Lance, MD;  Location: [place] CV LAB;  Service: Cardiovascular;  Laterality: N/A;  . PORTACATH PLACEMENT Left 01/18/2016   Procedure: INSERTION PORT-A-CATH;  Surgeon: Alphonsa Overall, MD;  Location: [place];  Service: General;  Laterality: Left;  . portacath removal    . TONSILLECTOMY    . TRANSTHORACIC ECHOCARDIOGRAM  02-24-2013      mild LVH,  ef 50-55%/  AV bioprosthesis was present with very mild stenosis and no regurg., mean grandient 73mHg, peak grandient 241mg /  mild MR/  mild LAE and RAE/  moderate TR  . TUBAL LIGATION       Medications: Current Meds  Medication Sig  . ACCU-CHEK GUIDE test strip Use to test blood sugar 3 times daily  . acetaminophen (TYLENOL) 325 MG tablet Take 2 tablets (650 mg total) by mouth every 6 (six) hours as needed for mild pain (or Fever >/= 101).  . Marland Kitchenlbuterol (PROVENTIL HFA;VENTOLIN HFA) 108 (90 Base) MCG/ACT inhaler Inhale 2 puffs into the lungs every 6 (six) hours as needed for wheezing or shortness of breath.  . Marland Kitchenmiodarone (PACERONE) 200 MG tablet Take 1 tablet (200 mg total) by mouth 2 (two) times daily.  . Marland Kitchenpixaban (ELIQUIS) 5 MG TABS tablet Take 1 tablet (5 mg total) by mouth 2 (two) times daily.  . Dulaglutide (TRULICITY) 1.5 MGPP/5.0DTOPN Inject contents of pen once a week (Patient taking differently: Inject 1.5 mg into the skin every Saturday. )  . exemestane (AROMASIN) 25 MG tablet TAKE ONE TABLET BY MOUTH DAILY AFTER BREAKFAST  . ezetimibe (ZETIA) 10 MG tablet Take 1 tablet (10 mg total) by mouth daily.  . fludrocortisone (FLORINEF) 0.1 MG tablet Take 1 tablet (0.1 mg total) by mouth daily.  . furosemide (LASIX) 20 MG tablet Take 1 tablet (20 mg total) by mouth daily as needed for fluid.  . Insulin  Disposable Pump (V-GO 30) KIT 1 Device by Does not apply route daily. (Patient taking differently: 1 Device by Does not apply route continuous. )  . insulin lispro (HUMALOG KWIKPEN) 100 UNIT/ML KiwkPen Inject 2-6 Units into the skin at bedtime as needed (CBG >200).  .Marland Kitchen  insulin lispro (HUMALOG) 100 UNIT/ML injection INJECT 76 UNITS DAILY WITH  V-GO PUMP  . metoprolol tartrate (LOPRESSOR) 25 MG tablet May take a total of 4 tablets in a 24 hour period.  . rosuvastatin (CRESTOR) 40 MG tablet TAKE ONE TABLET BY MOUTH DAILY (Patient taking differently: TAKE ONE TABLET (40 MG) BY MOUTH DAILY AT BEDTIME)  . SYMBICORT 160-4.5 MCG/ACT inhaler INHALE TWO PUFFS BY MOUTH TWICE DAILY  . traMADol (ULTRAM) 50 MG tablet Take 1 tablet (50 mg total) by mouth every 12 (twelve) hours as needed for severe pain.  Marland Kitchen zolpidem (AMBIEN) 5 MG tablet Take 1 tablet (5 mg total) by mouth at bedtime as needed for sleep.  . [DISCONTINUED] amiodarone (PACERONE) 200 MG tablet Take 1 tablet (200 mg total) by mouth daily.     Allergies: Allergies  Allergen Reactions  . Amoxicillin Rash and Other (See Comments)    Has patient had a PCN reaction causing immediate rash, facial/tongue/throat swelling, SOB or lightheadedness with hypotension: No Has patient had a PCN reaction causing severe rash involving mucus membranes or skin necrosis: Yes Has patient had a PCN reaction that required hospitalization No Has patient had a PCN reaction occurring within the last 10 years: No If all of the above answers are "NO", then may proceed with Cephalosporin use.   . Imdur [Isosorbide Dinitrate] Other (See Comments)    Headache/severe hypotension/Syncope  . Tape     Must use paper tape  . Arimidex [Anastrozole] Nausea Only  . Latex Itching  . Tetracycline Rash    Social History: The patient  reports that she has never smoked. She has never used smokeless tobacco. She reports that she does not drink alcohol or use drugs.   Family  History: The patient's family history includes Diabetes in her maternal grandmother and son; Healthy in her brother; Heart attack in her brother; Heart disease in her brother, father, and maternal grandmother; Heart failure in her father.   Review of Systems: Please see the history of present illness.   Otherwise, the review of systems is positive for none.   All other systems are reviewed and negative.   Physical Exam: VS:  BP (!) 150/70 (BP Location: Left Arm, Patient Position: Sitting, Cuff Size: Normal)   Pulse 83   Ht '5\' 5"'$  (1.651 m)   Wt 193 lb (87.5 kg)   SpO2 98% Comment: at rest  BMI 32.12 kg/m  .  BMI Body mass index is 32.12 kg/m.  Wt Readings from Last 3 Encounters:  12/25/17 193 lb (87.5 kg)  12/02/17 197 lb (89.4 kg)  11/20/17 200 lb (90.7 kg)    General: Pleasant. She looks older than her stated age. She is alert and in no acute distress.   HEENT: Normal.  Neck: Supple, no JVD, carotid bruits, or masses noted.  Cardiac: Regular rate and rhythm. Soft outflow murmur. No edema.  Respiratory:  Lungs are clear to auscultation bilaterally with normal work of breathing.  GI: Soft and nontender.  MS: No deformity or atrophy. Gait and ROM intact.  Skin: Warm and dry. Color is normal.  Neuro:  Strength and sensation are intact and no gross focal deficits noted.  Psych: Alert, appropriate and with normal affect.   LABORATORY DATA:  EKG:  EKG is ordered today. This demonstrates NSR. Reviewed with Dr. Lovena Le.   Lab Results  Component Value Date   WBC 7.2 12/23/2017   HGB 10.8 (L) 12/23/2017   HCT 33.6 (L) 12/23/2017   PLT  154 12/23/2017   GLUCOSE 154 (H) 12/23/2017   CHOL 109 08/01/2017   TRIG 90.0 08/01/2017   HDL 42.30 08/01/2017   LDLDIRECT 53.0 10/08/2016   LDLCALC 48 08/01/2017   ALT 42 (H) 10/01/2017   AST 35 10/01/2017   NA 141 12/23/2017   K 4.5 12/23/2017   CL 101 12/23/2017   CREATININE 1.95 (H) 12/23/2017   BUN 25 12/23/2017   CO2 24 12/23/2017    TSH 2.190 05/15/2017   INR 1.06 06/15/2013   HGBA1C 6.4 (A) 11/08/2017   MICROALBUR 13.3 (H) 02/14/2017     BNP (last 3 results) Recent Labs    09/06/17 1530  BNP 58.0    ProBNP (last 3 results) No results for input(s): PROBNP in the last 8760 hours.   Other Studies Reviewed Today:  Echo Study Conclusions December 2018  - Left ventricle: The cavity size was normal. Wall thickness was increased in a pattern of severe LVH. Systolic function was normal. The estimated ejection fraction was in the range of 60% to 65%. - Aortic valve: A bioprosthesis was present.   MYOVIEW FINDINGS FROM 06/2013: Normal resting EKG. Slight ST depressions noted in aVL after administration of lexiscan. Mild shortness of breath and nausea resolved after the test. EKG is nondiagnostic for ischemia. TID ratio 1.05. Lung-heart ratio 0.43. Normal ventricular chamber size.  IMPRESSION: No evidence for ischemia. Normal wall motion. LVEF 72%.   Electronically Signed By: Guy Sandifer On: 06/15/2013 14:27    Assessment/Plan:  1.Recurrent spells of palpitations - ? Recurrent atrial tach - very sympatomic and distressing for her. Discussed with Dr. Lovena Le who has subsequently seen Freda Munro today as well. He has offered ablation as well as attempt at increasing the Amiodarone. Explained that her monitor has not shown any dangerous arrhythmia although it does produce symptoms. He would like to see her back for recheck and further discussion. She is opting for increase in amiodarone at this time. She was cautioned about sun exposure.   2. Unexplained syncope - she has ILR in place. She has had AF noted.  3. History of atrial tach - see #1  4. History of orthostasis/syncope - now on low dose Florinef - we will continue this for now.   5. CAD - prior CABG - no active chest pain. Would follow. Her lastLHC showed stable anatomy with patent grafts except for SVG-RCA from older CABG.    6. Chronic diastolic HF -weight is down.  7. Anemia -recent CBC noted.  8. CKD - followed by nephrology - Dr. Posey Pronto - her dosing of Eliquis is currently correct per pharmaacy.   9. Carotid disease - will need repeat study 10/2018 - not discussed today.   Current medicines are reviewed with the patient today.  The patient does not have concerns regarding medicines other than what has been noted above.  The following changes have been made:  See above.  Labs/ tests ordered today include:    Orders Placed This Encounter  Procedures  . Hepatic function panel  . TSH  . EKG 12-Lead     Disposition:   FU with Dr. Lovena Le per his request.    Patient is agreeable to this plan and will call if any problems develop in the interim.   SignedTruitt Merle, NP  12/25/2017 2:19 PM  [facility] [address] [place], [place]  [address] Phone: [phone] Fax: [phone]

## 2017-12-25 NOTE — Patient Instructions (Addendum)
We will be checking the following labs today - TSH and LFTs   Medication Instructions:    Continue with your current medicines. BUT  I am increasing the amiodarone to twice a day - morning and night    Testing/Procedures To Be Arranged:  N/A  Follow-Up:   See Dr. Lovena Le in about 4 weeks with EKG  See me in 8 weeks    Other Special Instructions:   Try to check your BP and HR during any episode.   Ok to transmit the episodes     If you need a refill on your cardiac medications before your next appointment, please call your pharmacy.   Call the [place] office at [phone] if you have any questions, problems or concerns.

## 2017-12-26 LAB — HEPATIC FUNCTION PANEL
ALT: 38 IU/L — ABNORMAL HIGH (ref 0–32)
AST: 41 IU/L — ABNORMAL HIGH (ref 0–40)
Albumin: 3.9 g/dL (ref 3.5–4.8)
Alkaline Phosphatase: 75 IU/L (ref 39–117)
Bilirubin Total: 0.9 mg/dL (ref 0.0–1.2)
Bilirubin, Direct: 0.26 mg/dL (ref 0.00–0.40)
Total Protein: 6.4 g/dL (ref 6.0–8.5)

## 2017-12-26 LAB — TSH: TSH: 1.98 u[IU]/mL (ref 0.450–4.500)

## 2017-12-26 IMAGING — DX DG CHEST 2V
2 series · 2 of 2 positions shown · non-contrast
Comparison: 01/18/2016

CLINICAL DATA: Chronic diastolic congestive heart failure. Breast
cancer undergoing chemotherapy. Atrial a arrhythmia.

EXAM:
CHEST  2 VIEW

[chest pa]
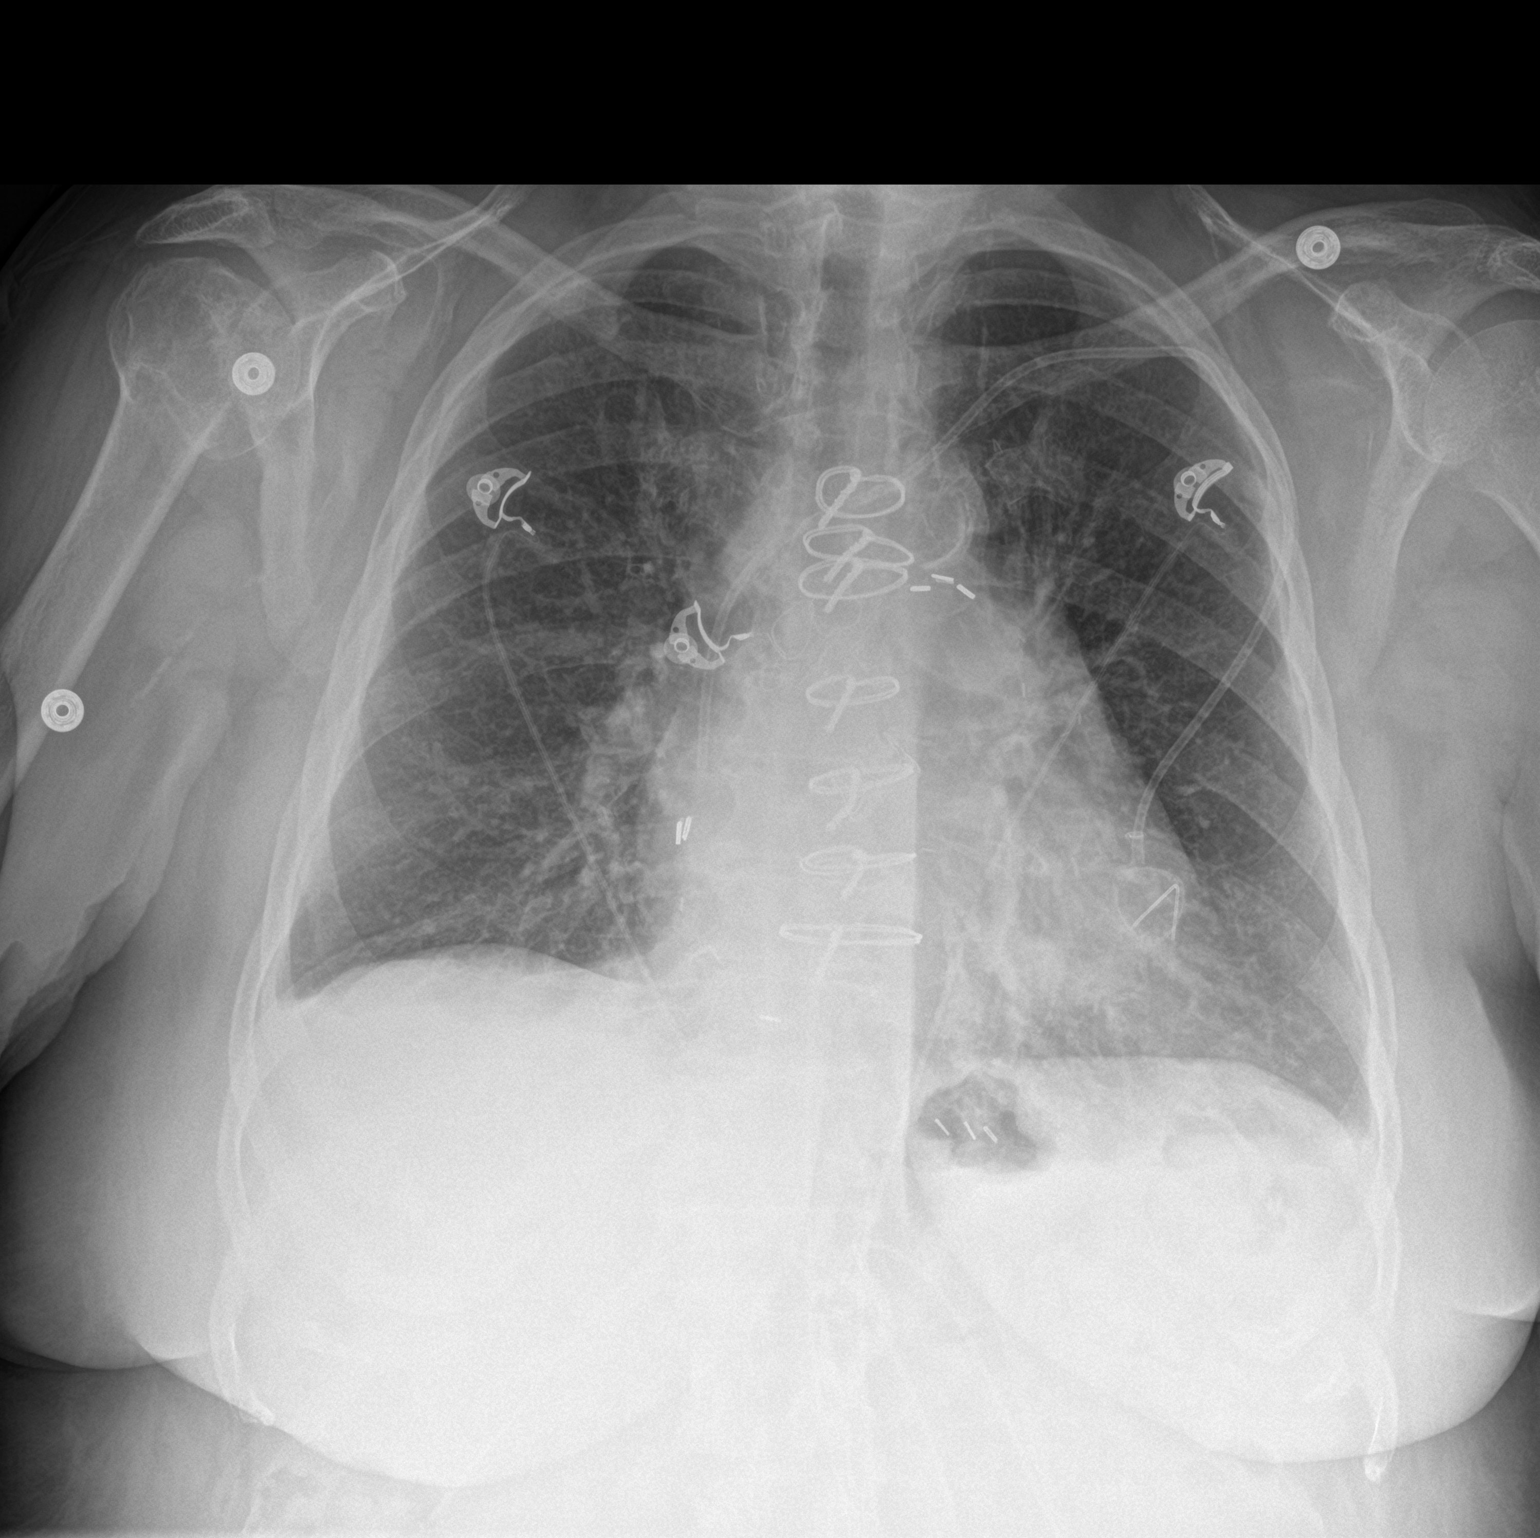

[chest lat]
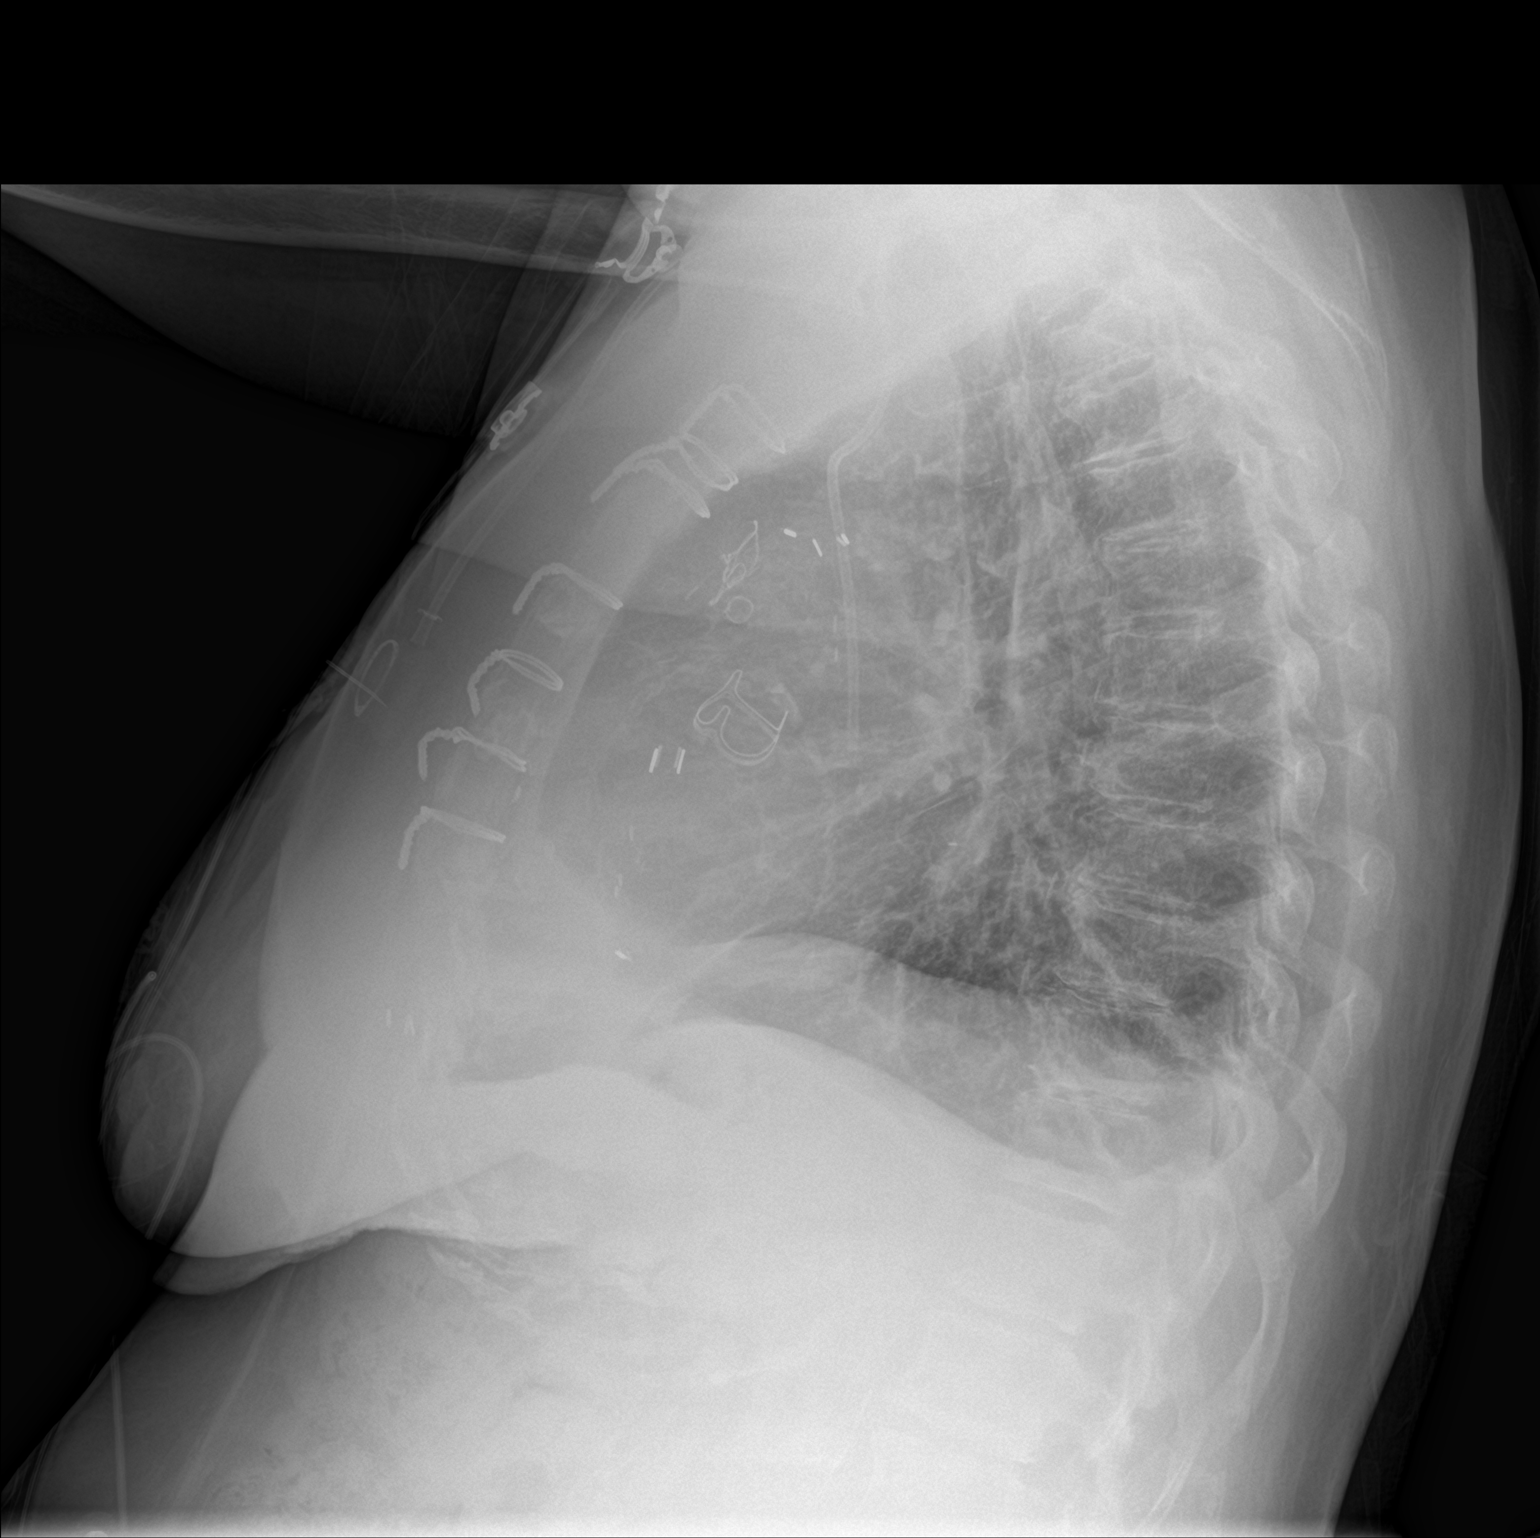

[2 of 2 positions shown; findings below may reference images not displayed]

FINDINGS: Previous median sternotomy and CABG. Previous aortic valve
replacement. Power port inserted from a left subclavian approach
with the tip in the SVC above right atrium. The heart is enlarged.
There is aortic atherosclerosis. There is pulmonary venous
hypertension without evidence of edema. There are small effusions in
the posterior costophrenic angles. No acute bone finding. Old right
humeral head fracture.
IMPRESSION: Pulmonary venous hypertension and small effusions in the posterior
costophrenic angle, consistent with low level congestive heart
failure.

## 2017-12-31 ENCOUNTER — Ambulatory Visit: Payer: Medicare Other | Admitting: Nurse Practitioner

## 2018-01-01 ENCOUNTER — Encounter: Payer: Self-pay | Admitting: Medical

## 2018-01-01 ENCOUNTER — Telehealth: Payer: Self-pay | Admitting: Medical

## 2018-01-01 MED ORDER — ZOLPIDEM TARTRATE 5 MG PO TABS: ORAL_TABLET | ORAL | 0 refills | Status: DC

## 2018-01-01 NOTE — Telephone Encounter (Signed)
Pt is requesting refill on Ambien.   Last OV: 11/20/2017 Last Fill: 12/04/2017 #30 and 0RF UDS: 11/20/2017

## 2018-01-01 NOTE — Telephone Encounter (Signed)
ambien refill sent to pt pharmacy.

## 2018-01-03 ENCOUNTER — Other Ambulatory Visit: Payer: Self-pay | Admitting: Nurse Practitioner

## 2018-01-03 MED ORDER — AMIODARONE HCL 200 MG PO TABS: 200.0000 mg | ORAL_TABLET | Freq: Two times a day (BID) | ORAL | 3 refills | Status: DC

## 2018-01-03 NOTE — Telephone Encounter (Signed)
Pt's medication was sent to pt's pharmacy as requested. Confirmation received.  °

## 2018-01-07 LAB — CUP PACEART REMOTE DEVICE CHECK
Date Time Interrogation Session: 20190629133805
Implantable Pulse Generator Implant Date: 20190322

## 2018-01-09 ENCOUNTER — Ambulatory Visit (INDEPENDENT_AMBULATORY_CARE_PROVIDER_SITE_OTHER): Payer: Medicare Other | Admitting: *Deleted

## 2018-01-09 DIAGNOSIS — R55 Syncope and collapse: Secondary | ICD-10-CM

## 2018-01-09 NOTE — Progress Notes (Signed)
[facility] Summary Report / Loop Recorder 

## 2018-01-11 IMAGING — DX DG CHEST 2V
2 series · 2 of 2 positions shown · non-contrast
Comparison: 04/09/2016

CLINICAL DATA: Dyspnea on exertion for 2 days.

EXAM:
CHEST  2 VIEW

[chest pa]
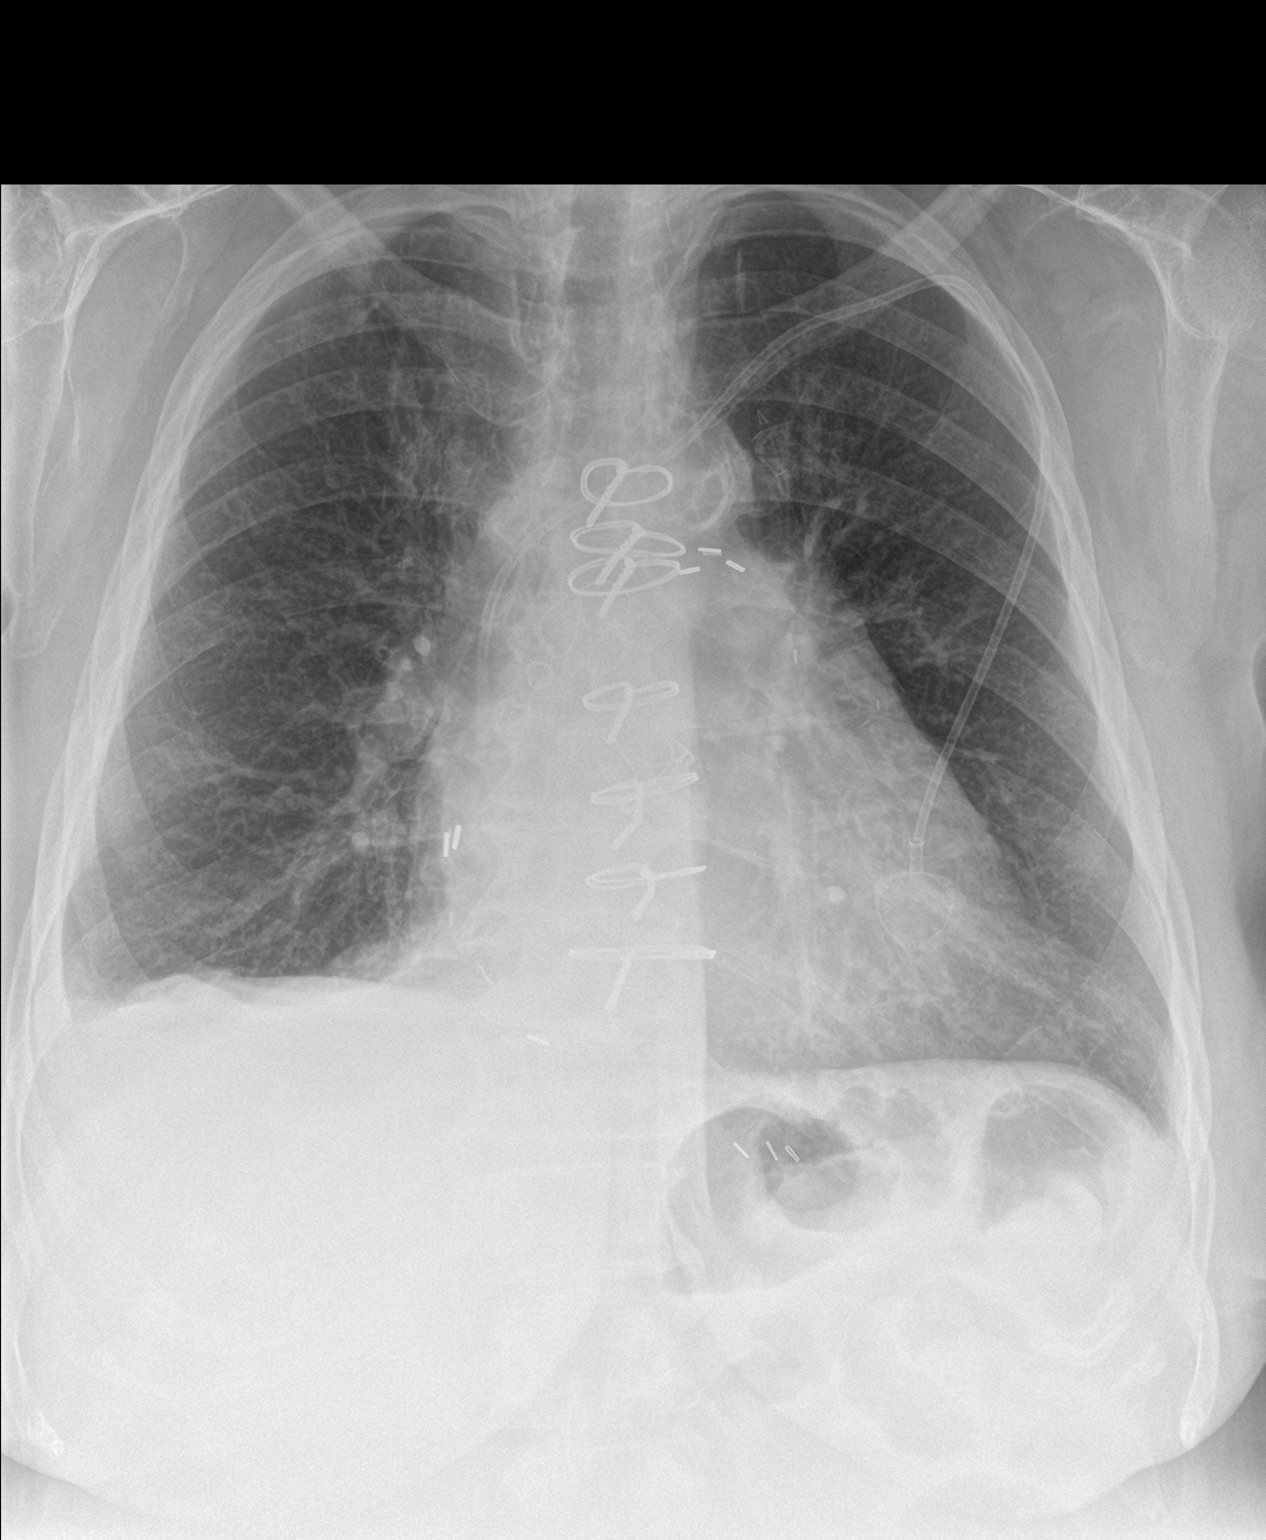

[chest lat]
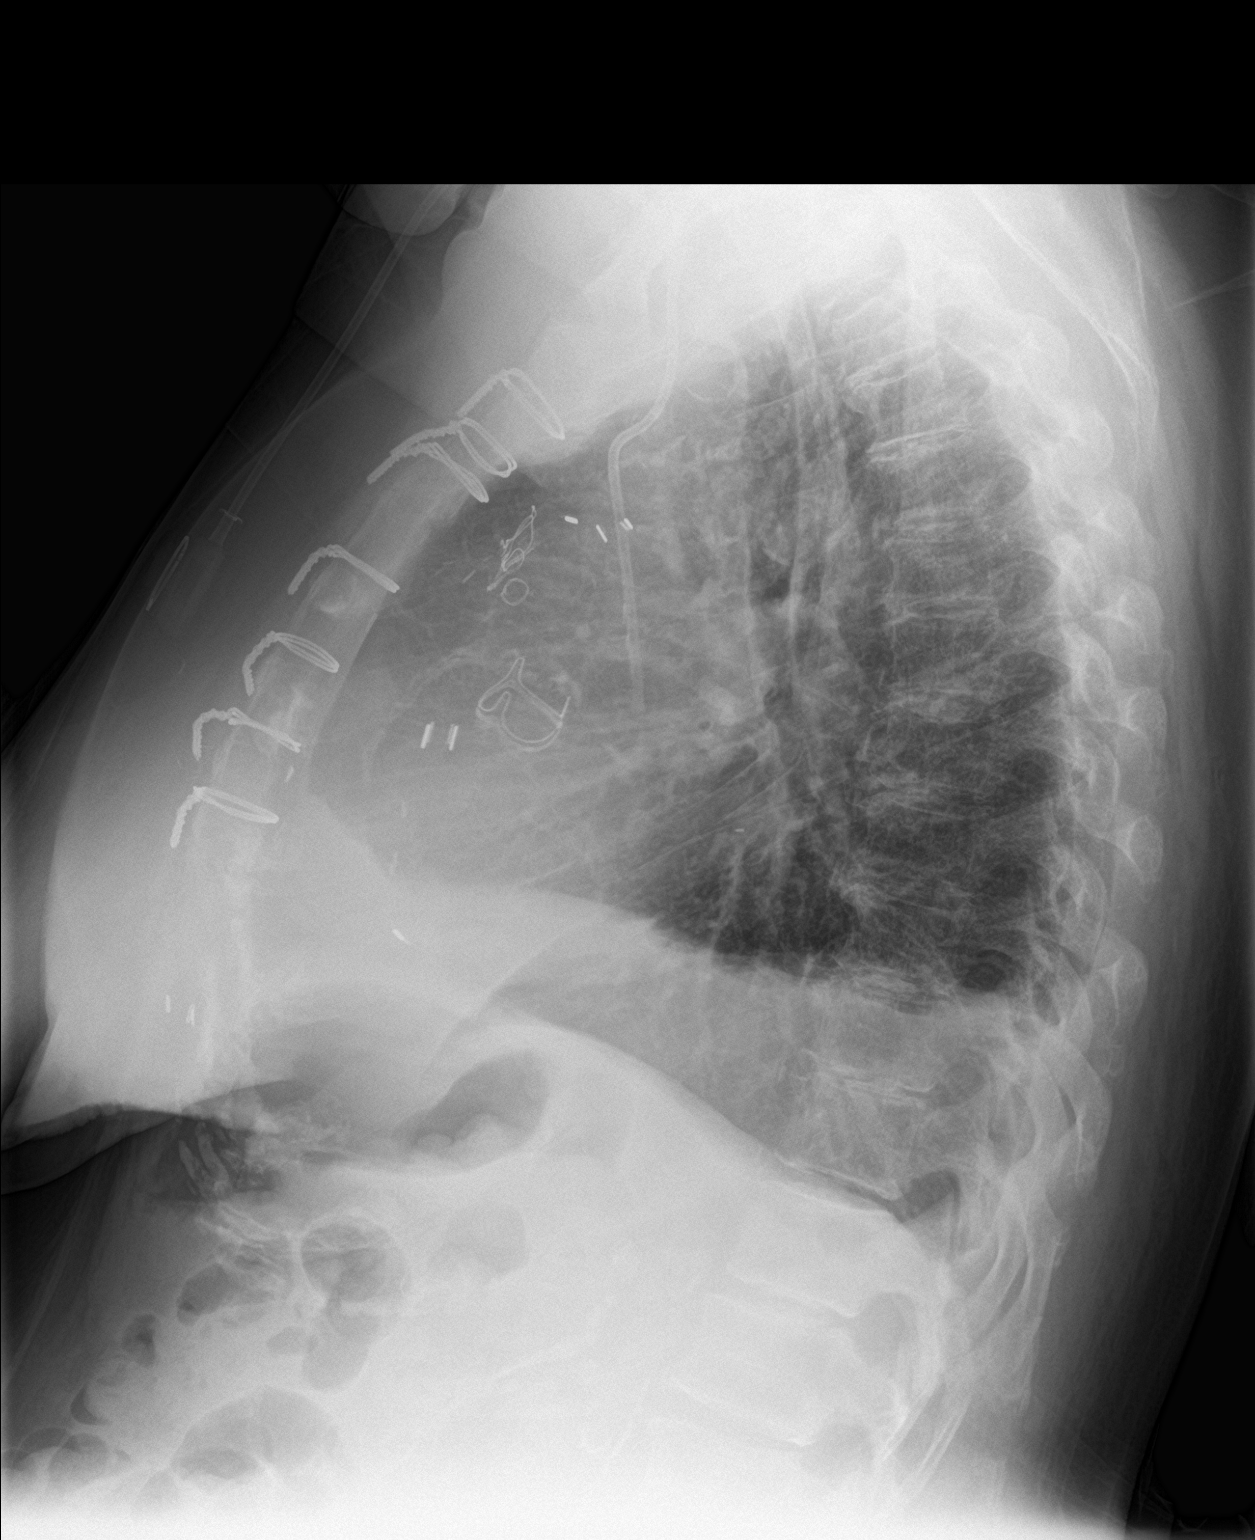

[2 of 2 positions shown; findings below may reference images not displayed]

FINDINGS: Lungs are hyperexpanded. Stable appearance tiny right pleural
effusion. Left pleural effusions seen previously has resolved. The
cardio pericardial silhouette is enlarged. Interstitial markings are
diffusely coarsened with chronic features. Left Port-A-Cath tip
overlies the distal SVC. Patient is status post CABG. Bones are
diffusely demineralized.
IMPRESSION: Cardiomegaly with emphysema.

Tiny right pleural effusion.

## 2018-01-20 ENCOUNTER — Ambulatory Visit: Payer: Medicare Other | Admitting: Nurse Practitioner

## 2018-01-23 ENCOUNTER — Ambulatory Visit (INDEPENDENT_AMBULATORY_CARE_PROVIDER_SITE_OTHER): Payer: Medicare Other | Admitting: Emergency Medicine

## 2018-01-23 ENCOUNTER — Encounter: Payer: Self-pay | Admitting: Emergency Medicine

## 2018-01-23 DIAGNOSIS — J45909 Unspecified asthma, uncomplicated: Secondary | ICD-10-CM

## 2018-01-23 DIAGNOSIS — E860 Dehydration: Secondary | ICD-10-CM | POA: Diagnosis not present

## 2018-01-23 DIAGNOSIS — I4891 Unspecified atrial fibrillation: Secondary | ICD-10-CM | POA: Diagnosis not present

## 2018-01-23 DIAGNOSIS — I480 Paroxysmal atrial fibrillation: Secondary | ICD-10-CM | POA: Insufficient documentation

## 2018-01-23 MED ORDER — BUDESONIDE-FORMOTEROL FUMARATE 160-4.5 MCG/ACT IN AERO: 2.0000 | INHALATION_SPRAY | Freq: Two times a day (BID) | RESPIRATORY_TRACT | 2 refills | Status: DC

## 2018-01-23 NOTE — Progress Notes (Signed)
  Subjective:    Patient ID: KYLANI WIRES, female    DOB: 07-01-1945, 72 y.o.   MRN: [id number]   ROV 01/23/18 --Mrs. Rivere is a 72 year old overweight woman, never smoker with a history of CAD/CABG, AVR, atrial fibrillation (back on amiodarone since mid 2018), breast cancer (surgery, chemo, radiation), documented restrictive and obstructive lung disease based on PFT from 03/2009.  Most recently she has been having issues with syncope, intermittent tachycardia, following w Dr Lovena Le. Her breathing is stable. She uses her Symbicort reliably. She occasionally has chest tightness, relieved by albuterol - uses 2x a month. Her PNA shots are UTD,   Most recent chest x-ray 09/06/2017 reviewed by me, shows no significant infiltrate, effusion.  Stable heart size, no evidence of pulmonary edema.                                                                                                                            Review of Systems as per HPI    Objective:   Physical Exam Vitals:   01/23/18 1144  BP: 124/82  Pulse: 80  SpO2: 100%  Weight: 199 lb (90.3 kg)  Height: 5\' 4"  (1.626 m)   Gen: Pleasant, obese, in no distress,  normal affect  ENT: No lesions,  mouth clear,  oropharynx clear, no postnasal drip  Neck: No JVD, no stridor  Lungs: No use of accessory muscles, clear without rales or rhonchi  Cardiovascular: irreg irreg, click present  Musculoskeletal: No deformities, no cyanosis or clubbing  Neuro: alert, non focal  Skin: Warm, no lesions or rashes   Assessment & Plan:  Asthma No exacerbations.  Overall stable on Symbicort.  Uses albuterol rarely so I do not think this is part of her problem with tachycardia.  We will continue same regimen.  Her pneumonia shots are up-to-date.  She knows to get the flu shot in the fall.  Please continue Symbicort 2 puffs twice a day.  Remember to rinse and gargle after using this medication.  We will refill this for you today. Keep albuterol  available to use 2 puffs if needed for shortness of breath, cough, chest tightness.  Atrial fibrillation (HCC) Amiodarone restarted since her last visit.  Most recent chest x-ray was unremarkable in March of this year.  We will plan to repeat a chest x-ray and pulmonary function testing in January 2020.  Baltazar Apo, MD, PhD 01/23/2018, 12:01 PM [facility] [phone] or if no answer [phone]

## 2018-01-23 NOTE — Addendum Note (Signed)
Addended by: Maryanna Shape A on: 01/23/2018 12:06 PM   Modules accepted: Orders

## 2018-01-23 NOTE — Assessment & Plan Note (Signed)
Amiodarone restarted since her last visit.  Most recent chest x-ray was unremarkable in March of this year.  We will plan to repeat a chest x-ray and pulmonary function testing in January 2020.

## 2018-01-23 NOTE — Patient Instructions (Signed)
Please continue Symbicort 2 puffs twice a day.  Remember to rinse and gargle after using this medication.  We will refill this for you today. Keep albuterol available to use 2 puffs if needed for shortness of breath, cough, chest tightness. We will plan to repeat your chest x-ray and your pulmonary function testing at the beginning of next year to monitor you while you are on amiodarone. Follow with Dr Lamonte Sakai in January with PFT on the same day.

## 2018-01-23 NOTE — Assessment & Plan Note (Signed)
No exacerbations.  Overall stable on Symbicort.  Uses albuterol rarely so I do not think this is part of her problem with tachycardia.  We will continue same regimen.  Her pneumonia shots are up-to-date.  She knows to get the flu shot in the fall.  Please continue Symbicort 2 puffs twice a day.  Remember to rinse and gargle after using this medication.  We will refill this for you today. Keep albuterol available to use 2 puffs if needed for shortness of breath, cough, chest tightness.

## 2018-01-24 ENCOUNTER — Ambulatory Visit (INDEPENDENT_AMBULATORY_CARE_PROVIDER_SITE_OTHER): Payer: Medicare Other | Admitting: Internal Medicine

## 2018-01-24 ENCOUNTER — Encounter: Payer: Self-pay | Admitting: Internal Medicine

## 2018-01-24 VITALS — BP 140/62 | HR 66 | Ht 64.0 in | Wt 200.0 lb

## 2018-01-24 DIAGNOSIS — R002 Palpitations: Secondary | ICD-10-CM | POA: Diagnosis not present

## 2018-01-24 DIAGNOSIS — R55 Syncope and collapse: Secondary | ICD-10-CM | POA: Diagnosis not present

## 2018-01-24 DIAGNOSIS — I471 Supraventricular tachycardia: Secondary | ICD-10-CM

## 2018-01-24 MED ORDER — EZETIMIBE 10 MG PO TABS: 10.0000 mg | ORAL_TABLET | Freq: Every day | ORAL | 3 refills | Status: DC

## 2018-01-24 MED ORDER — AMIODARONE HCL 200 MG PO TABS: 200.0000 mg | ORAL_TABLET | Freq: Two times a day (BID) | ORAL | 0 refills | Status: DC

## 2018-01-24 MED ORDER — AMIODARONE HCL 200 MG PO TABS: 200.0000 mg | ORAL_TABLET | Freq: Every day | ORAL | 3 refills | Status: DC

## 2018-01-24 NOTE — Progress Notes (Signed)
HPI [name] returns today for followup of her atrial fib. She is a pleasant 72 yo woman, with HTN, s/p AVR who has had refractory atrial fib. She has been on amio and her dose was increasd. Her symptoms are much improved. No chest pain or sob. No syncope.  Allergies  Allergen Reactions  . Amoxicillin Rash and Other (See Comments)    Has patient had a PCN reaction causing immediate rash, facial/tongue/throat swelling, SOB or lightheadedness with hypotension: No Has patient had a PCN reaction causing severe rash involving mucus membranes or skin necrosis: Yes Has patient had a PCN reaction that required hospitalization No Has patient had a PCN reaction occurring within the last 10 years: No If all of the above answers are "NO", then may proceed with Cephalosporin use.   . Imdur [Isosorbide Dinitrate] Other (See Comments)    Headache/severe hypotension/Syncope  . Tape     Must use paper tape  . Arimidex [Anastrozole] Nausea Only  . Latex Itching  . Tetracycline Rash     Current Outpatient Medications  Medication Sig Dispense Refill  . ACCU-CHEK GUIDE test strip Use to test blood sugar 3 times daily  5  . acetaminophen (TYLENOL) 325 MG tablet Take 2 tablets (650 mg total) by mouth every 6 (six) hours as needed for mild pain (or Fever >/= 101).    Marland Kitchen albuterol (PROVENTIL HFA;VENTOLIN HFA) 108 (90 Base) MCG/ACT inhaler Inhale 2 puffs into the lungs every 6 (six) hours as needed for wheezing or shortness of breath.    Marland Kitchen amiodarone (PACERONE) 200 MG tablet Take 1 tablet (200 mg total) by mouth 2 (two) times daily. 90 tablet 3  . apixaban (ELIQUIS) 5 MG TABS tablet Take 1 tablet (5 mg total) by mouth 2 (two) times daily. 60 tablet 11  . budesonide-formoterol (SYMBICORT) 160-4.5 MCG/ACT inhaler Inhale 2 puffs into the lungs 2 (two) times daily. 10.2 g 2  . Dulaglutide (TRULICITY) 1.5 MG/8.6PY SOPN Inject contents of pen once a week (Patient taking differently: Inject 1.5 mg into the  skin every Saturday. ) 12 pen 1  . exemestane (AROMASIN) 25 MG tablet TAKE ONE TABLET BY MOUTH DAILY AFTER BREAKFAST 90 tablet 3  . ezetimibe (ZETIA) 10 MG tablet Take 1 tablet (10 mg total) by mouth daily. 90 tablet 3  . fludrocortisone (FLORINEF) 0.1 MG tablet Take 1 tablet (0.1 mg total) by mouth daily. 90 tablet 3  . furosemide (LASIX) 20 MG tablet Take 1 tablet (20 mg total) by mouth daily as needed for fluid. 30 tablet 0  . Insulin Disposable Pump (V-GO 30) KIT 1 Device by Does not apply route daily. (Patient taking differently: 1 Device by Does not apply route continuous. ) 90 kit 3  . insulin lispro (HUMALOG KWIKPEN) 100 UNIT/ML KiwkPen Inject 2-6 Units into the skin at bedtime as needed (CBG >200).    . insulin lispro (HUMALOG) 100 UNIT/ML injection INJECT 76 UNITS DAILY WITH  V-GO PUMP 70 mL 3  . metoprolol tartrate (LOPRESSOR) 25 MG tablet May take a total of 4 tablets in a 24 hour period. 30 tablet 3  . rosuvastatin (CRESTOR) 40 MG tablet TAKE ONE TABLET BY MOUTH DAILY (Patient taking differently: TAKE ONE TABLET (40 MG) BY MOUTH DAILY AT BEDTIME) 90 tablet 3  . traMADol (ULTRAM) 50 MG tablet Take 1 tablet (50 mg total) by mouth every 12 (twelve) hours as needed for severe pain. 30 tablet 0  . zolpidem (AMBIEN) 5 MG  tablet Take 1 tablet (5 mg total) by mouth at bedtime as needed for sleep. 30 tablet 0   No current facility-administered medications for this visit.      Past Medical History:  Diagnosis Date  . Abnormally small mouth   . Acute on chronic combined systolic and diastolic congestive heart failure ([place]) 07/17/2013  . Adrenal insufficiency ([place]) 07/03/2016  . AKI (acute kidney injury) ([place]) 09/01/2017  . Allergic rhinitis 10/14/2009   Qualifier: Diagnosis of  By: Lamonte Sakai MD, Rose Fillers   Overview:  Overview:  Qualifier: Diagnosis of  By: Lamonte Sakai MD, Rose Fillers  Last Assessment & Plan:  Please continue Xyzal and Nasacort as you have been using them  . Anemia   . Asthma 05/12/2009    10/12/2014 p extensive coaching HFA effectiveness =    75% s spacer    Overview:  Overview:  10/12/2014 p extensive coaching HFA effectiveness =    75% s spacer   Last Assessment & Plan:  Please continue Symbicort 2 puffs twice a day. Remember to rinse and gargle after taking this medication. Take albuterol 2 puffs up to every 4 hours if needed for shortness of breath.  Follow with Dr Lamonte Sakai in 6 month  . Atrial tachycardia ([place]) 04/03/2013   Overview:  Last Assessment & Plan:  I have discussed the likely benign nature of her problem. She has very minimal palpitations. I doubt atrial flutter. I would not anti-coagulate. Her monitor demonstrates that her episodes are short lived. Will follow.   . Bilateral carotid artery stenosis    Bilateral ICA 40-59%/  >50% LECA  . Breast cancer ([place]) 01/18/2016   right breast  . Breast cancer of upper-inner quadrant of right female breast ([place]) 12/16/2015  . CAD (coronary artery disease) cardiologist-  dr Aundra Dubin   Remote CABG in 1998 with redo in 2009  . Chemotherapy induced nausea and vomiting 02/24/2016  . 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 CHF (congestive heart failure) (H[facility])    EF is low normal at 50 to 55% per echo in Jan. 2011  . Chronic coronary artery disease 01/24/2011   Overview:  Last Assessment & Plan:  Stable with no ischemic symptoms.  Continue ASA, statin, ACEI, beta blocker.   . Chronic diastolic CHF (congestive heart failure) ([place]) 09/24/2013  . CKD (chronic kidney disease), stage IV ([place]) 09/24/2013  . Closed fracture of head of left humerus 09/09/2017  . Essential hypertension 05/12/2009   Qualifier: Diagnosis of  By: Lamonte Sakai MD, Rose Fillers   . GERD (gastroesophageal reflux disease)   . H/O atrial tachycardia 05/12/2009   Qualifier: History of  By: Lamonte Sakai MD, Rose Fillers   Overview:  Overview:  Qualifier: History of  By: Lamonte Sakai MD, Rose Fillers  Last Assessment & Plan:  There was no  evidence of this on her cardiac monitor. I would recommend watchful waiting.   Marland Kitchen Hearing loss of right ear   . History of aortic valve replacement 10/27/2010   Overview:  Last Assessment & Plan:  Will get echo to reassess bioprosthetic aortic valve.   Marland Kitchen History of non-ST elevation myocardial infarction (NSTEMI)    Sept 2014--  thought to be type II HTN w/ LHC without infarct related artery and patent grafts  . History of radiation therapy 05/24/16-07/26/16   right breast 50.4 Gy in 28 fractions, right breast boost 10 Gy in 5 fractions  . Hyperlipidemia   . Hypertension    Resolved.  Marland Kitchen  Iron deficiency anemia   . Mania ([place]) 03/05/2016  . Moderate persistent asthma    pulmologist-  Dr. Malvin Johns  . Nonischemic cardiomyopathy ([place])   . Osteomyelitis of toe of left foot ([place]) 04/06/2016  . Osteopenia of multiple sites 10/19/2015  . PAF (paroxysmal atrial fibrillation) ([place])   . Personal history of chemotherapy 2017  . Personal history of radiation therapy 2017  . Port catheter in place 02/17/2016  . Psoriasis    right leg  . Renal calculus, right   . Renal insufficiency, mild   . S/P AVR    prosthesis valve placement 2009 at same time re-do CABG  . Seizure ([place]) 06/29/2016  . Seizure-like activity ([place]) 09/01/2017  . Sensorineural hearing loss (SNHL) of both ears 01/06/2016  . Stroke [facility])    residual rt hearing loss  . Syncope 09/06/2017  . Type 2 diabetes mellitus ([place])    monitored by dr Dwyane Dee    ROS:   All systems reviewed and negative except as noted in the HPI.   Past Surgical History:  Procedure Laterality Date  . AORTIC VALVE REPLACEMENT  2009   [facility] (done same time is CABG)  . BREAST LUMPECTOMY Right 01/18/2016  . BREAST LUMPECTOMY WITH RADIOACTIVE SEED AND SENTINEL LYMPH NODE BIOPSY Right 01/18/2016   Procedure: RIGHT BREAST LUMPECTOMY WITH RADIOACTIVE SEED AND SENTINEL LYMPH NODE BIOPSY;  Surgeon: DAlphonsa Overall MD;  [facility]   Service: General;  Laterality: Right;  . CARDIAC CATHETERIZATION  03/23/2008   Pre-redo CABG: L main OK, LAD (T), CFX (T), OM1 99%, RCA (T), LIMA-LAD OK, SVG-OM(?3) OK w/ little florw to OM2, SVG-RCA OK. EF NL  . CARPAL TUNNEL RELEASE    . COLONOSCOPY    . CORONARY ARTERY BYPASS GRAFT  1998 &  re-do 2009   Had LIMA to DX/LAD, SVG to 2 marginal branches and [facility]; SVG to 3rd OM and PD at time of redo  . CYSTOSCOPY W/ URETERAL STENT PLACEMENT Right 12/20/2014   Procedure: CYSTOSCOPY WITH RETROGRADE PYELOGRAM/URETERAL STENT PLACEMENT;  Surgeon: PCleon Gustin MD;  [facility]  Service: Urology;  Laterality: Right;  . EYE SURGERY Bilateral    cataracts  . HOLMIUM LASER APPLICATION Right [id number]/95/2841  Procedure:  HOLMIUM LASER LITHOTRIPSY;  Surgeon: PCleon Gustin MD;  [facility]  Service: Urology;  Laterality: Right;  . LEFT HEART CATHETERIZATION WITH CORONARY/GRAFT ANGIOGRAM N/A 02/23/2013   Procedure: LEFT HEART CATHETERIZATION WITH CBeatrix Fetters  Surgeon: MBlane Ohara MD;  [facility];  Service: Cardiovascular;  Laterality: N/A;  . LOOP RECORDER INSERTION N/A 08/30/2017   Procedure: LOOP RECORDER INSERTION;  Surgeon: TEvans Lance MD;  [facility];  Service: Cardiovascular;  Laterality: N/A;  . PORTACATH PLACEMENT Left 01/18/2016   Procedure: INSERTION PORT-A-CATH;  Surgeon: DAlphonsa Overall MD;  [facility]  Service: General;  Laterality: Left;  . portacath removal    . TONSILLECTOMY    . TRANSTHORACIC ECHOCARDIOGRAM  02-24-2013      mild LVH,  ef 50-55%/  AV bioprosthesis was present with very mild stenosis and no regurg., mean grandient 138mg, peak grandient 2031m /  mild MR/  mild LAE and RAE/  moderate TR  . TUBAL LIGATION       Family History  Problem Relation Age of Onset  . Heart disease Father   . Heart failure Father   . Diabetes  Maternal Grandmother   .  Heart disease Maternal Grandmother   . Diabetes Son   . Healthy Brother        #1  . Heart attack Brother        #2  . Heart disease Brother        #2     Social History   Socioeconomic History  . Marital status: Married    Spouse name: Not on file  . Number of children: 2  . Years of education: Not on file  . Highest education level: Not on file  Occupational History  . Occupation: retired  Scientific laboratory technician  . Financial resource strain: Not on file  . Food insecurity:    Worry: Not on file    Inability: Not on file  . Transportation needs:    Medical: Not on file    Non-medical: Not on file  Tobacco Use  . Smoking status: Never Smoker  . Smokeless tobacco: Never Used  Substance and Sexual Activity  . Alcohol use: No  . Drug use: No  . Sexual activity: Yes    Birth control/protection: None  Lifestyle  . Physical activity:    Days per week: Not on file    Minutes per session: Not on file  . Stress: Not on file  Relationships  . Social connections:    Talks on phone: Not on file    Gets together: Not on file    Attends religious service: Not on file    Active member of club or organization: Not on file    Attends meetings of clubs or organizations: Not on file    Relationship status: Not on file  . Intimate partner violence:    Fear of current or ex partner: Not on file    Emotionally abused: Not on file    Physically abused: Not on file    Forced sexual activity: Not on file  Other Topics Concern  . Not on file  Social History Narrative  . Not on file     BP 140/62   Pulse 66   Ht 5' 4"  (1.626 m)   Wt 200 lb (90.7 kg)   BMI 34.33 kg/m   Physical Exam:  Well appearing 72 yo woman, NAD HEENT: Unremarkable Neck:  6 cm JVD, no thyromegally Lymphatics:  No adenopathy Back:  No CVA tenderness Lungs:  Clear with no wheezes HEART:  Regular rate rhythm, no murmurs, no rubs, no clicks Abd:  soft, positive bowel sounds, no organomegally, no rebound, no  guarding Ext:  2 plus pulses, no edema, no cyanosis, no clubbing Skin:  No rashes no nodules Neuro:  CN II through XII intact, motor grossly intact  EKG - nsr  DEVICE  Normal device function.  See PaceArt for details. NSR  Assess/Plan: 1. Atrial fib - she is maintaining NSR very nicely. She will continue amio 200 bid until 9/30 and then switch to amio 200 mg daily. 2. HTN - her blood pressure is reasonably well controlled. No change in meds.  3. Chronic diastolic heart failure - she is improved in NSR. She will maintain a low sodium diet.  Mikle Bosworth.D.

## 2018-01-24 NOTE — Patient Instructions (Addendum)
Medication Instructions:  Your physician has recommended you make the following change in your medication:  1.  Continue taking amiodarone 200 mg- Take 1 tablet by mouth twice a day until March 10, 2018  2.  On March 11, 2018- decrease your amiodarone 200 mg tablets to 1 tablet by mouth daily.   Labwork: None ordered.  Testing/Procedures: None ordered.  Follow-Up: Your physician wants you to follow-up in: June 12, 2018 with Dr. Lovena Le.    1:45 pm    Any Other Special Instructions Will Be Listed Below (If Applicable).  If you need a refill on your cardiac medications before your next appointment, please call your pharmacy.

## 2018-01-25 LAB — CUP PACEART INCLINIC DEVICE CHECK
Date Time Interrogation Session: 20190816144908
Implantable Pulse Generator Implant Date: 20190322

## 2018-01-27 ENCOUNTER — Other Ambulatory Visit: Payer: Self-pay | Admitting: Internal Medicine

## 2018-02-03 ENCOUNTER — Encounter: Payer: Self-pay | Admitting: Medical

## 2018-02-03 ENCOUNTER — Encounter: Payer: Self-pay | Admitting: *Deleted

## 2018-02-04 ENCOUNTER — Other Ambulatory Visit: Payer: Self-pay

## 2018-02-04 ENCOUNTER — Emergency Department (HOSPITAL_COMMUNITY)
Admission: EM | Admit: 2018-02-04 | Discharge: 2018-02-04 | Disposition: A | Discharge status: Home / Self Care | Payer: Medicare Other | Attending: Emergency Medicine | Admitting: Emergency Medicine

## 2018-02-04 ENCOUNTER — Encounter (HOSPITAL_COMMUNITY): Payer: Self-pay | Admitting: Emergency Medicine

## 2018-02-04 ENCOUNTER — Other Ambulatory Visit: Payer: Self-pay | Admitting: Medical

## 2018-02-04 ENCOUNTER — Emergency Department (HOSPITAL_COMMUNITY): Payer: Medicare Other

## 2018-02-04 ENCOUNTER — Telehealth: Payer: Self-pay | Admitting: Medical

## 2018-02-04 ENCOUNTER — Telehealth: Payer: Self-pay | Admitting: *Deleted

## 2018-02-04 DIAGNOSIS — E1122 Type 2 diabetes mellitus with diabetic chronic kidney disease: Secondary | ICD-10-CM | POA: Diagnosis not present

## 2018-02-04 DIAGNOSIS — E119 Type 2 diabetes mellitus without complications: Secondary | ICD-10-CM | POA: Insufficient documentation

## 2018-02-04 DIAGNOSIS — N184 Chronic kidney disease, stage 4 (severe): Secondary | ICD-10-CM | POA: Insufficient documentation

## 2018-02-04 DIAGNOSIS — I130 Hypertensive heart and chronic kidney disease with heart failure and stage 1 through stage 4 chronic kidney disease, or unspecified chronic kidney disease: Secondary | ICD-10-CM | POA: Insufficient documentation

## 2018-02-04 DIAGNOSIS — J810 Acute pulmonary edema: Secondary | ICD-10-CM | POA: Insufficient documentation

## 2018-02-04 DIAGNOSIS — J45909 Unspecified asthma, uncomplicated: Secondary | ICD-10-CM | POA: Diagnosis not present

## 2018-02-04 DIAGNOSIS — R079 Chest pain, unspecified: Secondary | ICD-10-CM | POA: Diagnosis present

## 2018-02-04 DIAGNOSIS — Z794 Long term (current) use of insulin: Secondary | ICD-10-CM | POA: Insufficient documentation

## 2018-02-04 DIAGNOSIS — Z7901 Long term (current) use of anticoagulants: Secondary | ICD-10-CM | POA: Diagnosis not present

## 2018-02-04 DIAGNOSIS — Z91040 Latex allergy status: Secondary | ICD-10-CM | POA: Insufficient documentation

## 2018-02-04 DIAGNOSIS — Z79899 Other long term (current) drug therapy: Secondary | ICD-10-CM | POA: Insufficient documentation

## 2018-02-04 DIAGNOSIS — I5042 Chronic combined systolic (congestive) and diastolic (congestive) heart failure: Secondary | ICD-10-CM | POA: Diagnosis not present

## 2018-02-04 LAB — BASIC METABOLIC PANEL
Anion gap: 8 (ref 5–15)
BUN: 20 mg/dL (ref 8–23)
CO2: 30 mmol/L (ref 22–32)
Calcium: 9.1 mg/dL (ref 8.9–10.3)
Chloride: 105 mmol/L (ref 98–111)
Creatinine, Ser: 1.67 mg/dL — ABNORMAL HIGH (ref 0.44–1.00)
GFR calc Af Amer: 34 mL/min — ABNORMAL LOW (ref >60)
GFR calc non Af Amer: 30 mL/min — ABNORMAL LOW (ref >60)
Glucose, Bld: 156 mg/dL — ABNORMAL HIGH (ref 70–99)
Potassium: 3.9 mmol/L (ref 3.5–5.1)
Sodium: 143 mmol/L (ref 135–145)

## 2018-02-04 LAB — CBC
HCT: 33.3 % — ABNORMAL LOW (ref 36.0–46.0)
Hemoglobin: 10 g/dL — ABNORMAL LOW (ref 12.0–15.0)
MCH: 30.7 pg (ref 26.0–34.0)
MCHC: 30 g/dL (ref 30.0–36.0)
MCV: 102.1 fL — ABNORMAL HIGH (ref 78.0–100.0)
Platelets: 131 10*3/uL — ABNORMAL LOW (ref 150–400)
RBC: 3.26 MIL/uL — ABNORMAL LOW (ref 3.87–5.11)
RDW: 12.6 % (ref 11.5–15.5)
WBC: 7.4 10*3/uL (ref 4.0–10.5)

## 2018-02-04 LAB — BRAIN NATRIURETIC PEPTIDE: B Natriuretic Peptide: 222.2 pg/mL — ABNORMAL HIGH (ref 0.0–100.0)

## 2018-02-04 LAB — I-STAT TROPONIN, ED: Troponin i, poc: 0 ng/mL (ref 0.00–0.08)

## 2018-02-04 LAB — CBG MONITORING, ED: Glucose-Capillary: 141 mg/dL — ABNORMAL HIGH (ref 70–99)

## 2018-02-04 MED ORDER — FUROSEMIDE 10 MG/ML IJ SOLN
20.0000 mg | Freq: Once | INTRAMUSCULAR | Status: AC
  Administered 2018-02-04: 20 mg via INTRAVENOUS
  Filled 2018-02-04: qty 2

## 2018-02-04 MED ORDER — FUROSEMIDE 20 MG PO TABS: 20.0000 mg | ORAL_TABLET | Freq: Every day | ORAL | 0 refills | Status: DC | PRN

## 2018-02-04 NOTE — ED Provider Notes (Signed)
[facility] EMERGENCY DEPARTMENT Provider Note   CSN: [id number] Arrival date & time: 02/04/18  0908     History   Chief Complaint Chief Complaint  Patient presents with  . Chest Pain    HPI Hannah [name] is a 72 y.o. female since emergency department with chief complaint of shortness of breath.  She is an extensive past medical history including history of aortic valve replacement, coronary artery disease, combined heart failure.  She sees Dr. Lovena Le.  She states for 1 week she has had exertional dyspnea which is new and unlike her COPD that improves with rest.  She denies chest pain but complains of of sensation of pressure.  Secondarily she has had orthopnea which is also new for the past 3 days.  She states she normally uses 2 pillows however she has had to sleep upright because anytime she lies back she wakes up gasping for air.  Patient has no previous history of the same.  She states that she normally only takes her Lasix for peripheral edema and is unaware of any previous episodes of pulmonary edema.  She is on Eliquis and denies melena, hematochezia.  She is a continued sensation of chest pressure around the base of her sternum and wrapping around the bilateral lower aspects of her rib cage.  HPI  Past Medical History:  Diagnosis Date  . Abnormally small mouth   . Acute on chronic combined systolic and diastolic congestive heart failure ([place]) 07/17/2013  . Adrenal insufficiency ([place]) 07/03/2016  . AKI (acute kidney injury) ([place]) 09/01/2017  . Allergic rhinitis 10/14/2009   Qualifier: Diagnosis of  By: Lamonte Sakai MD, Rose Fillers   Overview:  Overview:  Qualifier: Diagnosis of  By: Lamonte Sakai MD, Rose Fillers  Last Assessment & Plan:  Please continue Xyzal and Nasacort as you have been using them  . Anemia   . Asthma 05/12/2009   10/12/2014 p extensive coaching HFA effectiveness =    75% s spacer    Overview:  Overview:  10/12/2014 p extensive coaching HFA effectiveness =    75% s  spacer   Last Assessment & Plan:  Please continue Symbicort 2 puffs twice a day. Remember to rinse and gargle after taking this medication. Take albuterol 2 puffs up to every 4 hours if needed for shortness of breath.  Follow with Dr Lamonte Sakai in 6 month  . Atrial tachycardia ([place]) 04/03/2013   Overview:  Last Assessment & Plan:  I have discussed the likely benign nature of her problem. She has very minimal palpitations. I doubt atrial flutter. I would not anti-coagulate. Her monitor demonstrates that her episodes are short lived. Will follow.   . Bilateral carotid artery stenosis    Bilateral ICA 40-59%/  >50% LECA  . Breast cancer ([place]) 01/18/2016   right breast  . Breast cancer of upper-inner quadrant of right female breast ([place]) 12/16/2015  . CAD (coronary artery disease) cardiologist-  dr Aundra Dubin   Remote CABG in 1998 with redo in 2009  . Chemotherapy induced nausea and vomiting 02/24/2016  . 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 CHF (congestive heart failure) (HCC)    EF is low normal at 50 to 55% per echo in Jan. 2011  . Chronic coronary artery disease 01/24/2011   Overview:  Last Assessment & Plan:  Stable with no ischemic symptoms.  Continue ASA, statin, ACEI, beta blocker.   . Chronic diastolic CHF (congestive heart failure) ([place]) 09/24/2013  .  CKD (chronic kidney disease), stage IV ([place]) 09/24/2013  . Closed fracture of head of left humerus 09/09/2017  . Essential hypertension 05/12/2009   Qualifier: Diagnosis of  By: Lamonte Sakai MD, Rose Fillers   . GERD (gastroesophageal reflux disease)   . H/O atrial tachycardia 05/12/2009   Qualifier: History of  By: Lamonte Sakai MD, Rose Fillers   Overview:  Overview:  Qualifier: History of  By: Lamonte Sakai MD, Rose Fillers  Last Assessment & Plan:  There was no evidence of this on her cardiac monitor. I would recommend watchful waiting.   Marland Kitchen Hearing loss of right ear   . History of aortic valve replacement  10/27/2010   Overview:  Last Assessment & Plan:  Will get echo to reassess bioprosthetic aortic valve.   Marland Kitchen History of non-ST elevation myocardial infarction (NSTEMI)    Sept 2014--  thought to be type II HTN w/ LHC without infarct related artery and patent grafts  . History of radiation therapy 05/24/16-07/26/16   right breast 50.4 Gy in 28 fractions, right breast boost 10 Gy in 5 fractions  . Hyperlipidemia   . Hypertension    Resolved.  . Iron deficiency anemia   . Mania ([place]) 03/05/2016  . Moderate persistent asthma    pulmologist-  Dr. Malvin Johns  . Nonischemic cardiomyopathy ([place])   . Osteomyelitis of toe of left foot ([place]) 04/06/2016  . Osteopenia of multiple sites 10/19/2015  . PAF (paroxysmal atrial fibrillation) ([place])   . Personal history of chemotherapy 2017  . Personal history of radiation therapy 2017  . Port catheter in place 02/17/2016  . Psoriasis    right leg  . Renal calculus, right   . Renal insufficiency, mild   . S/P AVR    prosthesis valve placement 2009 at same time re-do CABG  . Seizure ([place]) 06/29/2016  . Seizure-like activity ([place]) 09/01/2017  . Sensorineural hearing loss (SNHL) of both ears 01/06/2016  . Stroke [facility])    residual rt hearing loss  . Syncope 09/06/2017  . Type 2 diabetes mellitus ([place])    monitored by dr Dwyane Dee    Patient Active Problem List   Diagnosis Date Noted  . Atrial fibrillation ([place]) 01/23/2018  . Closed fracture of head of left humerus 09/09/2017  . Syncope 09/06/2017  . Seizure-like activity ([place]) 09/01/2017  . AKI (acute kidney injury) ([place]) 09/01/2017  . Adrenal insufficiency ([place]) 07/03/2016  . Seizure ([place]) 06/29/2016  . Chemotherapy-induced peripheral neuropathy ([place]) 04/27/2016  . 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 Osteomyelitis of toe of left foot ([place]) 04/06/2016  . Mania ([place]) 03/05/2016  . GERD (gastroesophageal reflux disease) 02/29/2016  . Chemotherapy induced nausea and vomiting 02/24/2016  . Port  catheter in place 02/17/2016  . Sensorineural hearing loss (SNHL) of both ears 01/06/2016  . Breast cancer of upper-inner quadrant of right female breast ([place]) 12/16/2015  . Osteopenia of multiple sites 10/19/2015  . CKD (chronic kidney disease), stage IV ([place]) 09/24/2013  . Chronic diastolic CHF (congestive heart failure) ([place]) 09/24/2013  . Acute on chronic combined systolic and diastolic congestive heart failure ([place]) 07/17/2013  . Atrial tachycardia ([place]) 04/03/2013  . CAD (coronary artery disease) 01/24/2011  . Chronic coronary artery disease 01/24/2011  . History of aortic valve replacement 10/27/2010  . Hyperlipidemia   . Nonischemic cardiomyopathy ([place])   . Anemia   . Type 2 diabetes mellitus ([place])   . Iron deficiency anemia   . Allergic rhinitis 10/14/2009  . Essential hypertension 05/12/2009  . H/O atrial tachycardia  05/12/2009  . Asthma 05/12/2009    Past Surgical History:  Procedure Laterality Date  . AORTIC VALVE REPLACEMENT  2009   [facility] (done same time is CABG)  . BREAST LUMPECTOMY Right 01/18/2016  . BREAST LUMPECTOMY WITH RADIOACTIVE SEED AND SENTINEL LYMPH NODE BIOPSY Right 01/18/2016   Procedure: RIGHT BREAST LUMPECTOMY WITH RADIOACTIVE SEED AND SENTINEL LYMPH NODE BIOPSY;  Surgeon: DAlphonsa Overall MD;  [facility]  Service: General;  Laterality: Right;  . CARDIAC CATHETERIZATION  03/23/2008   Pre-redo CABG: L main OK, LAD (T), CFX (T), OM1 99%, RCA (T), LIMA-LAD OK, SVG-OM(?3) OK w/ little florw to OM2, SVG-RCA OK. EF NL  . CARPAL TUNNEL RELEASE    . COLONOSCOPY    . CORONARY ARTERY BYPASS GRAFT  1998 &  re-do 2009   Had LIMA to DX/LAD, SVG to 2 marginal [facility]; SVG to 3rd OM and PD at time of redo  . CYSTOSCOPY W/ URETERAL STENT PLACEMENT Right 12/20/2014   Procedure: CYSTOSCOPY WITH RETROGRADE PYELOGRAM/URETERAL STENT PLACEMENT;  Surgeon: PCleon Gustin MD;  [facility]  Service: Urology;  Laterality: Right;  . EYE SURGERY Bilateral    cataracts  . HOLMIUM LASER APPLICATION Right [id number]/[id number]  Procedure:  HOLMIUM LASER LITHOTRIPSY;  Surgeon: PCleon Gustin MD;  [facility]  Service: Urology;  Laterality: Right;  . LEFT HEART CATHETERIZATION WITH CORONARY/GRAFT ANGIOGRAM N/A 02/23/2013   Procedure: LEFT HEART CATHETERIZATION WITH CBeatrix Fetters  Surgeon: MBlane Ohara MD;  [facility];  Service: Cardiovascular;  Laterality: N/A;  . LOOP RECORDER INSERTION N/A 08/30/2017   Procedure: LOOP RECORDER INSERTION;  Surgeon: TEvans Lance MD;  [facility];  Service: Cardiovascular;  Laterality: N/A;  . PORTACATH PLACEMENT Left 01/18/2016   Procedure: INSERTION PORT-A-CATH;  Surgeon: DAlphonsa Overall MD;  [facility]  Service: General;  Laterality: Left;  . portacath removal    . TONSILLECTOMY    . TRANSTHORACIC ECHOCARDIOGRAM  02-24-2013      mild LVH,  ef 50-55%/  AV bioprosthesis was present with very mild stenosis and no regurg., mean grandient 129mg, peak grandient 2069m /  mild MR/  mild LAE and RAE/  moderate TR  . TUBAL LIGATION       OB History   None      Home Medications    Prior to Admission medications   Medication Sig Start Date End Date Taking? Authorizing Provider  acetaminophen (TYLENOL) 325 MG tablet Take 2 tablets (650 mg total) by mouth every 6 (six) hours as needed for mild pain (or Fever >/= 101). 09/03/17  Yes Vann, Jessica U, DO  albuterol (PROVENTIL HFA;VENTOLIN HFA) 108 (90 Base) MCG/ACT inhaler Inhale 2 puffs into the lungs every 6 (six) hours as needed for wheezing or shortness of breath.   Yes [provider]  amiodarone (PACERONE) 200 MG tablet Take 1 tablet (200 mg total) by mouth 2 (two) times daily. 01/24/18 03/10/18 Yes TayEvans LanceD  budesonide-formoterol ([facility]60-4.5 MCG/ACT inhaler Inhale 2 puffs into the lungs 2 (two) times  daily. 01/23/18  Yes ByrCollene GobbleD  Dulaglutide (TRULICITY) 1.5 MG/DG/3.8VFPN Inject contents of pen once a week Patient taking differently: Inject 1.5 mg into the skin every Friday.  08/05/17  Yes KumElayne SnareD  ELIQUIS 5 MG TABS tablet TAKE ONE TABLET BY MOUTH TWICE DAILY 01/27/18  Yes  Evans Lance, MD  exemestane (AROMASIN) 25 MG tablet TAKE ONE TABLET BY MOUTH DAILY AFTER BREAKFAST 11/20/17  Yes Nicholas Lose, MD  ezetimibe (ZETIA) 10 MG tablet Take 1 tablet (10 mg total) by mouth daily. 01/24/18  Yes Evans Lance, MD  fludrocortisone (FLORINEF) 0.1 MG tablet Take 1 tablet (0.1 mg total) by mouth daily. 10/01/17  Yes [name], NP  furosemide (LASIX) 20 MG tablet Take 1 tablet (20 mg total) by mouth daily as needed for fluid. 09/07/17  Yes Lavina Hamman, MD  Insulin Disposable Pump (V-GO 30) KIT 1 Device by Does not apply route daily. Patient taking differently: 1 Device by Does not apply route continuous.  08/05/17  Yes Elayne Snare, MD  insulin lispro (HUMALOG KWIKPEN) 100 UNIT/ML KiwkPen 2-6 Units at bedtime as needed (CBG >200).    Yes [provider]  insulin lispro (HUMALOG) 100 UNIT/ML injection INJECT 76 UNITS DAILY WITH  V-GO PUMP 09/10/17  Yes Elayne Snare, MD  metoprolol tartrate (LOPRESSOR) 25 MG tablet May take a total of 4 tablets in a 24 hour period. 12/09/17  Yes [name], NP  ranitidine (ZANTAC) 75 MG tablet Take 75 mg by mouth as needed for heartburn.   Yes [provider]  rosuvastatin (CRESTOR) 40 MG tablet TAKE ONE TABLET BY MOUTH DAILY Patient taking differently: Take 40 mg by mouth daily.  08/28/17  Yes [name], NP  traMADol (ULTRAM) 50 MG tablet Take 1 tablet (50 mg total) by mouth every 12 (twelve) hours as needed for severe pain. 11/20/17  Yes Saguier, Percell Miller, PA-C  zolpidem (AMBIEN) 5 MG tablet Take 1 tablet (5 mg total) by mouth at bedtime as needed for sleep. 01/01/18  Yes Saguier, Percell Miller, PA-C  ACCU-CHEK GUIDE test strip Use  to test blood sugar 3 times daily 09/09/17   [provider]  amiodarone (PACERONE) 200 MG tablet Take 1 tablet (200 mg total) by mouth daily. Patient not taking: Reported on 02/04/2018 03/11/18   Evans Lance, MD    Family History Family History  Problem Relation Age of Onset  . Heart disease Father   . Heart failure Father   . Diabetes Maternal Grandmother   . Heart disease Maternal Grandmother   . Diabetes Son   . Healthy Brother        #1  . Heart attack Brother        #2  . Heart disease Brother        #2    Social History Social History   Tobacco Use  . Smoking status: Never Smoker  . Smokeless tobacco: Never Used  Substance Use Topics  . Alcohol use: No  . Drug use: No     Allergies   Amoxicillin; Imdur [isosorbide dinitrate]; Tape; Arimidex [anastrozole]; Latex; and Tetracycline   Review of Systems Review of Systems  Ten systems reviewed and are negative for acute change, except as noted in the HPI.   Physical Exam Updated Vital Signs BP (!) 177/72 (BP Location: Left Arm)   Pulse 66   Temp 98 F (36.7 C) (Oral)   Resp 17   Ht 5' 4"  (1.626 m)   Wt 89.4 kg   SpO2 96%   BMI 33.81 kg/m   Physical Exam  Constitutional: She is oriented to person, place, and time. She appears well-developed and well-nourished. No distress.  HENT:  Head: Normocephalic and atraumatic.  Eyes: Conjunctivae are normal. No scleral icterus.  Neck: Normal range  of motion.  Cardiovascular: Normal rate, regular rhythm and normal heart sounds. Exam reveals no gallop and no friction rub.  No murmur heard. Pulmonary/Chest: Effort normal and breath sounds normal. No respiratory distress.  Abdominal: Soft. Bowel sounds are normal. She exhibits no distension and no mass. There is no tenderness. There is no guarding.  Neurological: She is alert and oriented to person, place, and time.  Skin: Skin is warm and dry. She is not diaphoretic.  Psychiatric: Her behavior is normal.    Nursing note and vitals reviewed.    ED Treatments / Results  Labs (all labs ordered are listed, but only abnormal results are displayed) Labs Reviewed  BASIC METABOLIC PANEL - Abnormal; Notable for the following components:      Result Value   Glucose, Bld 156 (*)    Creatinine, Ser 1.67 (*)    GFR calc non Af Amer 30 (*)    GFR calc Af Amer 34 (*)    All other components within normal limits  CBC - Abnormal; Notable for the following components:   RBC 3.26 (*)    Hemoglobin 10.0 (*)    HCT 33.3 (*)    MCV 102.1 (*)    Platelets 131 (*)    All other components within normal limits  BRAIN NATRIURETIC PEPTIDE - Abnormal; Notable for the following components:   B Natriuretic Peptide 222.2 (*)    All other components within normal limits  CBG MONITORING, ED - Abnormal; Notable for the following components:   Glucose-Capillary 141 (*)    All other components within normal limits  I-STAT TROPONIN, ED    EKG EKG Interpretation  Date/Time:  Tuesday February 04 2018 09:11:33 EDT Ventricular Rate:  68 PR Interval:  200 QRS Duration: 96 QT Interval:  462 QTC Calculation: 491 R Axis:   -2 Text Interpretation:  Normal sinus rhythm Low voltage QRS Incomplete right bundle branch block Cannot rule out Anterior infarct , age undetermined Abnormal ECG No significant change was found Confirmed by Jola Schmidt [phone]) on 02/04/2018 9:55:08 AM   Radiology Dg Chest 2 View  Result Date: 02/04/2018 CLINICAL DATA:  Chest pain.  Tightness. EXAM: CHEST - 2 VIEW COMPARISON:  09/06/2017. FINDINGS: Prior CABG. Prior cardiac valve replacement. Cardiac monitor device noted. Cardiomegaly with very mild bilateral interstitial prominence and tiny pleural effusions degenerative and posttraumatic changes noted both shoulders. These are stable. IMPRESSION: Prior CABG. Prior cardiac valve replacement. Cardiac monitor noted. Cardiomegaly with very mild bilateral interstitial prominence and tiny bilateral  pleural effusions. Very mild CHF may be present. Electronically Signed   By: Marcello Moores  Register   On: 02/04/2018 10:28    Procedures Procedures (including critical care time)  Medications Ordered in ED Medications  furosemide (LASIX) injection 20 mg (20 mg Intravenous Given 02/04/18 1054)     Initial Impression / Assessment and Plan / ED Course  I have reviewed the triage vital signs and the nursing notes.  Pertinent labs & imaging results that were available during my care of the patient were reviewed by me and considered in my medical decision making (see chart for details).     Ms. Walski is here with exertional dyspnea and orthopnea.  She has a history of combined systolic and diastolic heart failure but has no known history of pulmonary edema.The emergent differential diagnosis for shortness of breath includes, but is not limited to, Pulmonary edema, bronchoconstriction, Pneumonia, Pulmonary embolism, Pneumotherax/ Hemothorax, Dysrythmia, ACS.  In this patient's case I believe that she  is having pulmonary edema as evidenced by her chest x-ray.  I personally reviewed the PA and lateral chest film which shows bilateral pulmonary interstitial edema and bilateral very small pleural effusions.  Patient's BNP is only somewhat elevated at 222 however her previous BNP was 58 showing a marketed change.  Her labs are otherwise unremarkable with a negative troponin.  She had significant output after giving IV Lasix and was able to ambulate in the ER easily and to the bathroom several times and ambulate more than 100 feet with normal oxygen saturations.  The patient feels much improved.  I discussed the case with Dr. Venora Maples who agrees with her work-up.  I feel that she is stable to be discharged home.  I talked with Trish from cardiology who will have the patient set up for an outpatient echocardiogram.  I discussed this with the patient who understands the plan of care and I have answered all her  questions to the best my ability.  She should be contacted by her cardiologist office tomorrow.  She states that she is feeling much better and would like to go home.  She appears appropriate for discharge at this time with return precautions discussed  Final Clinical Impressions(s) / ED Diagnoses   Final diagnoses:  None    ED Discharge Orders    None       Margarita Mail, PA-C 02/04/18 [place], MD 02/04/18 [phone]

## 2018-02-04 NOTE — ED Notes (Signed)
CBG - 141 ° °

## 2018-02-04 NOTE — Telephone Encounter (Signed)
Will you run pt narx report. I just refilled her ambien.

## 2018-02-04 NOTE — Telephone Encounter (Signed)
Refill request: Zolpidem  Last RX: 01/01/18 Last OV:11/20/17 Next SO:RTQS scheduled  UDS:11/20/17 CSC:11/20/17 CSR:

## 2018-02-04 NOTE — Discharge Instructions (Addendum)
Take 1 tablet of your 20 mg lasix daily for the next 4 days. You receive a call from your cardiology office about an appointment for an outpatient echocardiogram in the next 24 hours.  If you do not hear anything by noon tomorrow please give them a call. Return to the ED for the following sxs: Get help right away if: You have severe chest pain, especially if the pain is crushing or pressure-like and spreads to the arms, back, neck, or jaw. THIS IS AN EMERGENCY. Do not wait to see if the pain will go away. Call for local emergency medical help. Do not drive yourself to the hospital. You have sweating, feel sick to your stomach (nauseous), or are experiencing shortness of breath. Your weight increases by 3 lb (1.4 kg) in 1 day or 5 lb (2.3 kg) in a week. You notice increasing shortness of breath that is unusual for you. This may happen during rest, sleep, or with activity. You develop chest pain (angina) or pain that is unusual for you. You notice more swelling in your hands, feet, ankles, or abdomen. You notice lasting (persistent) dizziness, blurred vision, headache, or unsteadiness. You begin to cough up bloody mucus (sputum). You are unable to sleep because it is hard to breathe. You begin to feel a jumping or fluttering sensation (palpitations) in the chest that is unusual for you.

## 2018-02-04 NOTE — Telephone Encounter (Signed)
Pt calling in today due to having chest tightness for the last several days,  chest feels heavy, feels like someone is sitting on chest, is having active heaviness in chest on phone, and not feeling well.    Hannah [name] stated pt needs to go to hospital to get seen. Pt is agreeable to treatment plan.

## 2018-02-04 NOTE — ED Notes (Signed)
Pt's oxygen saturation fluctuated between 97% and 100% on room air while ambulating

## 2018-02-04 NOTE — ED Notes (Addendum)
Pt ambulatory to and from restroom w/ steady gait.

## 2018-02-04 NOTE — ED Triage Notes (Signed)
Pt arrives from home substernal chest pressure that started 2 days ago but progressed last night. Pt states it feels like a heavy pressure and makes it difficult to breathe. Pt states when lying flat it becomes difficult to breathe.

## 2018-02-05 ENCOUNTER — Telehealth: Payer: Self-pay | Admitting: Nurse Practitioner

## 2018-02-05 ENCOUNTER — Other Ambulatory Visit: Payer: Self-pay | Admitting: Nurse Practitioner

## 2018-02-05 DIAGNOSIS — I5032 Chronic diastolic (congestive) heart failure: Secondary | ICD-10-CM

## 2018-02-05 NOTE — Telephone Encounter (Signed)
New message:      Pt states she was due to schedule and echo per her hosp discharge notes. I do not see the order for that. Can one please be put in and will call the pt back to schedule

## 2018-02-05 NOTE — Telephone Encounter (Signed)
**Note De-Identified Hannah [name] Obfuscation** Patient contacted regarding discharge from [facility] on 02/05/18.  Patient understands to follow up with provider Truitt Merle, NP on 02/18/18 at 3:00 at [facility] in [place]. Patient understands discharge instructions? Yes Patient understands medications and regiment? Yes Patient understands to bring all medications to this visit? Yes

## 2018-02-05 NOTE — Telephone Encounter (Signed)
Printed and place on you desk.

## 2018-02-05 NOTE — Telephone Encounter (Signed)
S/w pt stated this office called couple minutes prior to calling again,pt prefers to see [name], LI appt cancelled twice.  Pt will have echo 8/30, orders in.  Pt stated D/C from hospital stated for pt to take lasix for 4 days than PRN with potassium for 4 days than PRN. [name] stated that would be ok.  Pt will see [name] back Sept 10 for ov. Pt is agreeable to plan.

## 2018-02-05 NOTE — Telephone Encounter (Signed)
Reviewed ER visit from yesterday. Was felt to have pulmonary edema. Was given IV diuretic with good response.   Would have her take her lasix 20 mg each day.   Echo as advised.   I need to see her afterwards.   [name], RN, [facility] [address] [place], [place]  [phone]

## 2018-02-05 NOTE — Telephone Encounter (Signed)
New Message:     TCM on 02/24/18 @ 11:30 with Dorene Ar per [place]

## 2018-02-06 ENCOUNTER — Telehealth: Payer: Self-pay | Admitting: Medical

## 2018-02-06 NOTE — Telephone Encounter (Signed)
Opened to review rx refills.

## 2018-02-07 ENCOUNTER — Ambulatory Visit (HOSPITAL_COMMUNITY): Payer: Medicare Other | Attending: Cardiology

## 2018-02-07 ENCOUNTER — Ambulatory Visit
Admission: RE | Admit: 2018-02-07 | Discharge: 2018-02-07 | Disposition: A | Discharge status: Home / Self Care | Payer: Medicare Other | Source: Ambulatory Visit | Attending: Hematology and Oncology | Admitting: Hematology and Oncology

## 2018-02-07 ENCOUNTER — Other Ambulatory Visit: Payer: Self-pay

## 2018-02-07 ENCOUNTER — Other Ambulatory Visit (INDEPENDENT_AMBULATORY_CARE_PROVIDER_SITE_OTHER): Payer: Medicare Other

## 2018-02-07 DIAGNOSIS — I4891 Unspecified atrial fibrillation: Secondary | ICD-10-CM | POA: Insufficient documentation

## 2018-02-07 DIAGNOSIS — I130 Hypertensive heart and chronic kidney disease with heart failure and stage 1 through stage 4 chronic kidney disease, or unspecified chronic kidney disease: Secondary | ICD-10-CM | POA: Diagnosis not present

## 2018-02-07 DIAGNOSIS — C50919 Malignant neoplasm of unspecified site of unspecified female breast: Secondary | ICD-10-CM | POA: Insufficient documentation

## 2018-02-07 DIAGNOSIS — Z953 Presence of xenogenic heart valve: Secondary | ICD-10-CM | POA: Diagnosis not present

## 2018-02-07 DIAGNOSIS — N184 Chronic kidney disease, stage 4 (severe): Secondary | ICD-10-CM | POA: Diagnosis not present

## 2018-02-07 DIAGNOSIS — I5032 Chronic diastolic (congestive) heart failure: Secondary | ICD-10-CM | POA: Diagnosis present

## 2018-02-07 DIAGNOSIS — I081 Rheumatic disorders of both mitral and tricuspid valves: Secondary | ICD-10-CM | POA: Diagnosis not present

## 2018-02-07 DIAGNOSIS — E1165 Type 2 diabetes mellitus with hyperglycemia: Secondary | ICD-10-CM

## 2018-02-07 DIAGNOSIS — R55 Syncope and collapse: Secondary | ICD-10-CM | POA: Insufficient documentation

## 2018-02-07 DIAGNOSIS — E1122 Type 2 diabetes mellitus with diabetic chronic kidney disease: Secondary | ICD-10-CM | POA: Diagnosis not present

## 2018-02-07 DIAGNOSIS — E785 Hyperlipidemia, unspecified: Secondary | ICD-10-CM | POA: Diagnosis not present

## 2018-02-07 DIAGNOSIS — D649 Anemia, unspecified: Secondary | ICD-10-CM | POA: Insufficient documentation

## 2018-02-07 DIAGNOSIS — I2510 Atherosclerotic heart disease of native coronary artery without angina pectoris: Secondary | ICD-10-CM | POA: Insufficient documentation

## 2018-02-07 DIAGNOSIS — I429 Cardiomyopathy, unspecified: Secondary | ICD-10-CM | POA: Insufficient documentation

## 2018-02-07 DIAGNOSIS — C50211 Malignant neoplasm of upper-inner quadrant of right female breast: Secondary | ICD-10-CM

## 2018-02-07 DIAGNOSIS — Z794 Long term (current) use of insulin: Secondary | ICD-10-CM | POA: Diagnosis not present

## 2018-02-07 DIAGNOSIS — Z170 Estrogen receptor positive status [ER+]: Principal | ICD-10-CM

## 2018-02-07 LAB — BASIC METABOLIC PANEL
BUN: 26 mg/dL — ABNORMAL HIGH (ref 6–23)
CO2: 32 mEq/L (ref 19–32)
Calcium: 8.8 mg/dL (ref 8.4–10.5)
Chloride: 103 mEq/L (ref 96–112)
Creatinine, Ser: 1.91 mg/dL — ABNORMAL HIGH (ref 0.40–1.20)
GFR: 27.42 mL/min — ABNORMAL LOW (ref >60.00)
Glucose, Bld: 245 mg/dL — ABNORMAL HIGH (ref 70–99)
Potassium: 3.9 mEq/L (ref 3.5–5.1)
Sodium: 140 mEq/L (ref 135–145)

## 2018-02-07 LAB — HEMOGLOBIN A1C: Hgb A1c MFr Bld: 6.7 % — ABNORMAL HIGH (ref 4.6–6.5)

## 2018-02-11 ENCOUNTER — Ambulatory Visit (INDEPENDENT_AMBULATORY_CARE_PROVIDER_SITE_OTHER): Payer: Medicare Other | Admitting: *Deleted

## 2018-02-11 DIAGNOSIS — R55 Syncope and collapse: Secondary | ICD-10-CM | POA: Diagnosis not present

## 2018-02-11 NOTE — Progress Notes (Signed)
[facility] Summary Report / Loop Recorder 

## 2018-02-12 ENCOUNTER — Ambulatory Visit (INDEPENDENT_AMBULATORY_CARE_PROVIDER_SITE_OTHER): Payer: Medicare Other | Admitting: Endocrinology

## 2018-02-12 ENCOUNTER — Encounter: Payer: Self-pay | Admitting: Endocrinology

## 2018-02-12 VITALS — BP 150/58 | HR 78 | Ht 64.0 in | Wt 193.0 lb

## 2018-02-12 DIAGNOSIS — N183 Chronic kidney disease, stage 3 unspecified: Secondary | ICD-10-CM

## 2018-02-12 DIAGNOSIS — E1165 Type 2 diabetes mellitus with hyperglycemia: Secondary | ICD-10-CM

## 2018-02-12 DIAGNOSIS — Z794 Long term (current) use of insulin: Secondary | ICD-10-CM

## 2018-02-12 NOTE — Patient Instructions (Addendum)
Take upto 7 clicks at supper for large meals  Click before each meal  Protein snack at bedtime if sugar <140

## 2018-02-12 NOTE — Progress Notes (Signed)
Patient ID: Hannah [name], female   DOB: Oct 20, 1945, 72 y.o.   MRN: [id number]           Reason for Appointment: Follow-up for Type 2 Diabetes   History of Present Illness:          Date of diagnosis of type 2 diabetes mellitus: Late 1980s        Background history:  She was previously treated with metformin prior to starting insulin She thinks she has been on insulin for at least 10 years and mostly taking Lantus and other types of insulin with it Her metformin was stopped when she started insulin and also has had renal dysfunction Previously her A1c had been consistently over 8% and occasionally higher.  Recent history:   INSULIN regimen is described as:  V-go 30 basal, mealtime coverage 8 u acB, lunch 1-2 clicks and 3-53 acS  She has been on the V-go pump since 03/2015, on the 30 unit pump since about 08/2017  Most recent A1c 6.7 although was 6.4 previously  Current home diabetes management, blood sugar patterns and problems identified:   She has lost weight since her last visit  This is mostly from trying to cut back on portions and snacks as well as carbohydrates and appears to be needing less insulin overall with better control this year  Also continues to be using the 1.5 mg Trulicity which she is tolerating  She still appears to have near normal or sometimes slightly low readings fasting but only one reading below 65 recently  She does appear to be sometimes forgetting to bolus at mealtimes at suppertime causing variably high readings at night  Highest reading 352 at bedtime  She also thinks that if her blood sugars are under 170 at bedtime she will get low overnight but review of her blood sugars does not confirm this  She will sometimes have a snack later tonight but sometimes will only have some juice with near normal readings  Usually trying to have only 1 carbohydrate serving at suppertime but sometimes may have more  She cannot be sure about how exactly she  is adjusting her mealtime dose at suppertime  Last night she had only one small serving of carbohydrate and cannot remember how much bolus she took  She is not understanding the fact that the bolus insulin in the past may be leftover and can be discarded  Still trying to exercise about 2 times a week  Non-insulin hypoglycemic drugs the patient is taking are: Trulicity 1.5 mg weekly  Side effects from medications have been: None  Compliance with the medical regimen: Fair  Glucose monitoring:  done about 2 + times a day         Glucometer:  Accu-Chek [facility] Blood Glucose readings by download for the last 4 weeks:   PRE-MEAL Fasting Lunch  afternoon Bedtime Overall  Glucose range:  56-139  117  139-198  98-352   Mean/median:  100    183      Self-care: The diet that the patient has been following is: tries to limit fats and carbs.  She uses diet green tea or diet drinks and no sugar in drinks    Lunch 1-2 pm Dinner 6-7 PM; Hs snack may not always have protein  Typical meal intake: Breakfast is oatrmeal or cheese toast              Dietician visit, most recent: 5/16 And in 7/16 with oncology dietitian  Exercise: Gym in 2/19, 2-3/7 days  Weight history:  Wt Readings from Last 3 Encounters:  02/12/18 193 lb (87.5 kg)  02/04/18 197 lb (89.4 kg)  01/24/18 200 lb (90.7 kg)    Glycemic control:      Lab Results  Component Value Date   HGBA1C 6.7 (H) 02/07/2018   HGBA1C 6.4 (A) 11/08/2017   HGBA1C 7.7 (H) 05/16/2017   Lab Results  Component Value Date   MICROALBUR 13.3 (H) 02/14/2017   LDLCALC 48 08/01/2017   CREATININE 1.91 (H) 02/07/2018       Allergies as of 02/12/2018      Reactions   Amoxicillin Rash, Other (See Comments)   Has patient had a PCN reaction causing immediate rash, facial/tongue/throat swelling, SOB or lightheadedness with hypotension: No Has patient had a PCN reaction causing severe rash involving mucus membranes or skin necrosis:  Yes Has patient had a PCN reaction that required hospitalization No Has patient had a PCN reaction occurring within the last 10 years: No If all of the above answers are "NO", then may proceed with Cephalosporin use.   Imdur [isosorbide Dinitrate] Other (See Comments)   Headache/severe hypotension/Syncope   Tape    Must use paper tape   Arimidex [anastrozole] Nausea Only   Latex Itching   Tetracycline Rash      Medication List        Accurate as of 02/12/18  9:34 PM. Always use your most recent med list.          ACCU-CHEK GUIDE test strip Generic drug:  glucose blood Use to test blood sugar 3 times daily   acetaminophen 325 MG tablet Commonly known as:  TYLENOL Take 2 tablets (650 mg total) by mouth every 6 (six) hours as needed for mild pain (or Fever >/= 101).   albuterol 108 (90 Base) MCG/ACT inhaler Commonly known as:  PROVENTIL HFA;VENTOLIN HFA Inhale 2 puffs into the lungs every 6 (six) hours as needed for wheezing or shortness of breath.   amiodarone 200 MG tablet Commonly known as:  PACERONE Take 1 tablet (200 mg total) by mouth 2 (two) times daily.   amiodarone 200 MG tablet Commonly known as:  PACERONE Take 1 tablet (200 mg total) by mouth daily. Start taking on:  03/11/2018   budesonide-formoterol 160-4.5 MCG/ACT inhaler Commonly known as:  SYMBICORT Inhale 2 puffs into the lungs 2 (two) times daily.   Dulaglutide 1.5 MG/0.5ML Sopn Inject contents of pen once a week   ELIQUIS 5 MG Tabs tablet Generic drug:  apixaban TAKE ONE TABLET BY MOUTH TWICE DAILY   exemestane 25 MG tablet Commonly known as:  AROMASIN TAKE ONE TABLET BY MOUTH DAILY AFTER BREAKFAST   ezetimibe 10 MG tablet Commonly known as:  ZETIA Take 1 tablet (10 mg total) by mouth daily.   fludrocortisone 0.1 MG tablet Commonly known as:  FLORINEF Take 1 tablet (0.1 mg total) by mouth daily.   furosemide 20 MG tablet Commonly known as:  LASIX Take 1 tablet (20 mg total) by mouth  daily as needed for fluid.   HUMALOG KWIKPEN 100 UNIT/ML KiwkPen Generic drug:  insulin lispro 2-6 Units at bedtime as needed (CBG >200).   insulin lispro 100 UNIT/ML injection Commonly known as:  HUMALOG INJECT 76 UNITS DAILY WITH  V-GO PUMP   metoprolol tartrate 25 MG tablet Commonly known as:  LOPRESSOR May take a total of 4 tablets in a 24 hour period.   ranitidine 75 MG tablet Commonly known  as:  ZANTAC Take 75 mg by mouth as needed for heartburn.   rosuvastatin 40 MG tablet Commonly known as:  CRESTOR TAKE ONE TABLET BY MOUTH DAILY   traMADol 50 MG tablet Commonly known as:  ULTRAM Take 1 tablet (50 mg total) by mouth every 12 (twelve) hours as needed for severe pain.   V-GO 30 Kit 1 Device by Does not apply route daily.   zolpidem 5 MG tablet Commonly known as:  AMBIEN Take 1 tablet (5 mg total) by mouth at bedtime as needed for sleep.       Allergies:  Allergies  Allergen Reactions  . Amoxicillin Rash and Other (See Comments)    Has patient had a PCN reaction causing immediate rash, facial/tongue/throat swelling, SOB or lightheadedness with hypotension: No Has patient had a PCN reaction causing severe rash involving mucus membranes or skin necrosis: Yes Has patient had a PCN reaction that required hospitalization No Has patient had a PCN reaction occurring within the last 10 years: No If all of the above answers are "NO", then may proceed with Cephalosporin use.   . Imdur [Isosorbide Dinitrate] Other (See Comments)    Headache/severe hypotension/Syncope  . Tape     Must use paper tape  . Arimidex [Anastrozole] Nausea Only  . Latex Itching  . Tetracycline Rash    Past Medical History:  Diagnosis Date  . Abnormally small mouth   . Acute on chronic combined systolic and diastolic congestive heart failure ([place]) 07/17/2013  . Adrenal insufficiency ([place]) 07/03/2016  . AKI (acute kidney injury) ([place]) 09/01/2017  . Allergic rhinitis 10/14/2009   Qualifier:  Diagnosis of  By: Lamonte Sakai MD, Rose Fillers   Overview:  Overview:  Qualifier: Diagnosis of  By: Lamonte Sakai MD, Rose Fillers  Last Assessment & Plan:  Please continue Xyzal and Nasacort as you have been using them  . Anemia   . Asthma 05/12/2009   10/12/2014 p extensive coaching HFA effectiveness =    75% s spacer    Overview:  Overview:  10/12/2014 p extensive coaching HFA effectiveness =    75% s spacer   Last Assessment & Plan:  Please continue Symbicort 2 puffs twice a day. Remember to rinse and gargle after taking this medication. Take albuterol 2 puffs up to every 4 hours if needed for shortness of breath.  Follow with Dr Lamonte Sakai in 6 month  . Atrial tachycardia ([place]) 04/03/2013   Overview:  Last Assessment & Plan:  I have discussed the likely benign nature of her problem. She has very minimal palpitations. I doubt atrial flutter. I would not anti-coagulate. Her monitor demonstrates that her episodes are short lived. Will follow.   . Bilateral carotid artery stenosis    Bilateral ICA 40-59%/  >50% LECA  . Breast cancer ([place]) 01/18/2016   right breast  . Breast cancer of upper-inner quadrant of right female breast ([place]) 12/16/2015  . CAD (coronary artery disease) cardiologist-  dr Aundra Dubin   Remote CABG in 1998 with redo in 2009  . Chemotherapy induced nausea and vomiting 02/24/2016  . Chemotherapy-induced peripheral neuropathy ([place]) 04/27/2016  . 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 CHF (congestive heart failure) (H[facility])    EF is low normal at 50 to 55% per echo in Jan. 2011  . Chronic coronary artery disease 01/24/2011   Overview:  Last Assessment & Plan:  Stable with no ischemic symptoms.  Continue ASA, statin, ACEI, beta blocker.   . Chronic diastolic CHF (congestive heart failure) ([place]) 09/24/2013  .  CKD (chronic kidney disease), stage IV ([place]) 09/24/2013  . Closed fracture of head of left humerus 09/09/2017  . Essential hypertension 05/12/2009   Qualifier: Diagnosis of  By: Lamonte Sakai MD, Rose Fillers   . GERD  (gastroesophageal reflux disease)   . H/O atrial tachycardia 05/12/2009   Qualifier: History of  By: Lamonte Sakai MD, Rose Fillers   Overview:  Overview:  Qualifier: History of  By: Lamonte Sakai MD, Rose Fillers  Last Assessment & Plan:  There was no evidence of this on her cardiac monitor. I would recommend watchful waiting.   Marland Kitchen Hearing loss of right ear   . History of aortic valve replacement 10/27/2010   Overview:  Last Assessment & Plan:  Will get echo to reassess bioprosthetic aortic valve.   Marland Kitchen History of non-ST elevation myocardial infarction (NSTEMI)    Sept 2014--  thought to be type II HTN w/ LHC without infarct related artery and patent grafts  . History of radiation therapy 05/24/16-07/26/16   right breast 50.4 Gy in 28 fractions, right breast boost 10 Gy in 5 fractions  . Hyperlipidemia   . Hypertension    Resolved.  . Iron deficiency anemia   . Mania ([place]) 03/05/2016  . Moderate persistent asthma    pulmologist-  Dr. Malvin Johns  . Nonischemic cardiomyopathy ([place])   . Osteomyelitis of toe of left foot ([place]) 04/06/2016  . Osteopenia of multiple sites 10/19/2015  . PAF (paroxysmal atrial fibrillation) ([place])   . Personal history of chemotherapy 2017  . Personal history of radiation therapy 2017  . Port catheter in place 02/17/2016  . Psoriasis    right leg  . Renal calculus, right   . Renal insufficiency, mild   . S/P AVR    prosthesis valve placement 2009 at same time re-do CABG  . Seizure ([place]) 06/29/2016  . Seizure-like activity ([place]) 09/01/2017  . Sensorineural hearing loss (SNHL) of both ears 01/06/2016  . Stroke [facility])    residual rt hearing loss  . Syncope 09/06/2017  . Type 2 diabetes mellitus ([place])    monitored by dr Dwyane Dee    Past Surgical History:  Procedure Laterality Date  . AORTIC VALVE REPLACEMENT  2009   [facility]done same time is CABG)  . BREAST LUMPECTOMY Right 01/18/2016  . BREAST LUMPECTOMY WITH RADIOACTIVE SEED AND SENTINEL LYMPH NODE BIOPSY Right  01/18/2016   Procedure: RIGHT BREAST LUMPECTOMY WITH RADIOACTIVE SEED AND SENTINEL LYMPH NODE BIOPSY;  Surgeon: DAlphonsa Overall MD;  [place]  Service: General;  Laterality: Right;  . CARDIAC CATHETERIZATION  03/23/2008   Pre-redo CABG: L main OK, LAD (T), CFX (T), OM1 99%, RCA (T), LIMA-LAD OK, SVG-OM(?3) OK w/ little florw to OM2, SVG-RCA OK. EF NL  . CARPAL TUNNEL RELEASE    . COLONOSCOPY    . CORONARY ARTERY BYPASS GRAFT  1998 &  re-do 2009   Had LIMA to DX/LAD, [facility] SVG to 3rd OM and PD at time of redo  . CYSTOSCOPY W/ URETERAL STENT PLACEMENT Right 12/20/2014   Procedure: CYSTOSCOPY WITH RETROGRADE PYELOGRAM/URETERAL STENT PLACEMENT;  Surgeon: PCleon Gustin MD;  [facility]  Service: Urology;  Laterality: Right;  . EYE SURGERY Bilateral    cataracts  . HOLMIUM LASER APPLICATION Right [id number]/[id number]  Procedure:  HOLMIUM LASER LITHOTRIPSY;  Surgeon: PCleon Gustin MD;  [facility]  Service: Urology;  Laterality: Right;  .  LEFT HEART CATHETERIZATION WITH CORONARY/GRAFT ANGIOGRAM N/A 02/23/2013   Procedure: LEFT HEART CATHETERIZATION WITH Beatrix Fetters;  Surgeon: Blane Ohara, MD;  [facility] C[facility];  Service: Cardiovascular;  Laterality: N/A;  . LOOP RECORDER INSERTION N/A 08/30/2017   Procedure: LOOP RECORDER INSERTION;  Surgeon: Evans Lance, MD;  Location: [facility] CV LAB;  Service: Cardiovascular;  Laterality: N/A;  . PORTACATH PLACEMENT Left 01/18/2016   Procedure: INSERTION PORT-A-CATH;  Surgeon: Alphonsa Overall, MD;  Location: [facility] [facility];  Service: General;  Laterality: Left;  . portacath removal    . TONSILLECTOMY    . TRANSTHORACIC ECHOCARDIOGRAM  02-24-2013      mild LVH,  ef 50-55%/  AV bioprosthesis was present with very mild stenosis and no regurg., mean grandient 49mHg, peak grandient 263mg /  mild MR/  mild LAE and RAE/  moderate TR  . TUBAL LIGATION       Family History  Problem Relation Age of Onset  . Heart disease Father   . Heart failure Father   . Diabetes Maternal Grandmother   . Heart disease Maternal Grandmother   . Diabetes Son   . Healthy Brother        #1  . Heart attack Brother        #2  . Heart disease Brother        #2    Social History:  reports that she has never smoked. She has never used smokeless tobacco. She reports that she does not drink alcohol or use drugs.    Review of Systems    Lipid history: On 40 mg Crestor along with Zetia      Lab Results  Component Value Date   CHOL 109 08/01/2017   HDL 42.30 08/01/2017   LDLCALC 48 08/01/2017   LDLDIRECT 53.0 10/08/2016   TRIG 90.0 08/01/2017   CHOLHDL 3 08/01/2017           Eyes:  No history of retinopathy  She is followed by nephrologist for renal dysfunction   Lab Results  Component Value Date   CREATININE 1.91 (H) 02/07/2018   CREATININE 1.67 (H) 02/04/2018   CREATININE 1.95 (H) 12/23/2017    Diabetic foot exam in 7/18: She has  loss of monofilament sensation in her distal feet   Physical Examination:  BP (!) 150/58 (BP Location: Left Arm, Patient Position: Sitting, Cuff Size: Normal)   Pulse 78   Ht 5' 4"  (1.626 m)   Wt 193 lb (87.5 kg)   SpO2 97%   BMI 33.13 kg/m    ASSESSMENT:  Diabetes type 2, on insulin pump    See history of present illness for detailed discussion of  current management, blood sugar patterns and problems identified  Her A1c is excellent at 6.7 considering her age and multiple problems and duration of diabetes With the help of Trulicity and her trying to do better on her diet she has lost little weight and appears to be needing less insulin compared to the last 2 visits Discussed day-to-day adjustment of bolus insulin based on what she is eating Also discussed that she needs to be consistent with taking boluses before eating and if need be take it right after eating Most likely she needs to take 4-10  units of insulin coverage at suppertime based on her meal size and carbohydrate intake She may do better with making notes about how much insulin she takes for what kind of meal since she cannot accurately remember how she is  doing this Also discussed differences between basal and bolus insulin on her pump  Fasting readings are low normal at times and has had a couple of low readings However since these are not consistently low will not go down to 20 units basal at this time  She does need to continue Trulicity More consistent protein intake for bedtime snack but may not need a snack if blood sugars are over 140 To check blood sugars more consistently before each meal by rotation to help do correction doses   RENAL dysfunction: This appears to be stable and followed by nephrologist  Hypertension: She will follow-up with nephrologist, blood pressure unusually high today   PLAN:     As above  To check some readings after breakfast and lunch and does not necessarily need fasting glucose daily  Counseling time on subjects discussed in assessment and plan sections is over 50% of today's 25 minute visit   Patient Instructions  Take upto 7 clicks at supper for large meals  Click before each meal  Protein snack at bedtime if sugar <140        Hannah [name] 02/12/2018, 9:34 PM   Note: This office note was prepared with Dragon voice recognition system technology. Any transcriptional errors that result from this process are unintentional.

## 2018-02-14 IMAGING — US US RENAL
1 series · 14 of 25 positions shown · non-contrast
Comparison: Noncontrast abdominal and pelvic CT scan December 07, 2014.

CLINICAL DATA: Chronic renal insufficiency stage III. Diabetes,
breast malignancy, coronary artery disease with previous CABG.

EXAM:
RENAL / URINARY TRACT ULTRASOUND COMPLETE

[Series 1: us renal · 0.23mm/px · 14 of 35 slices shown]
[im 1/35]
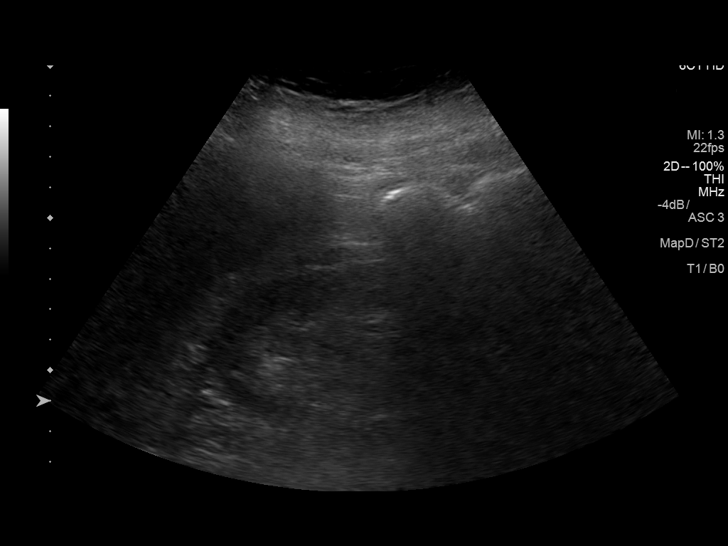
[im 3/35]
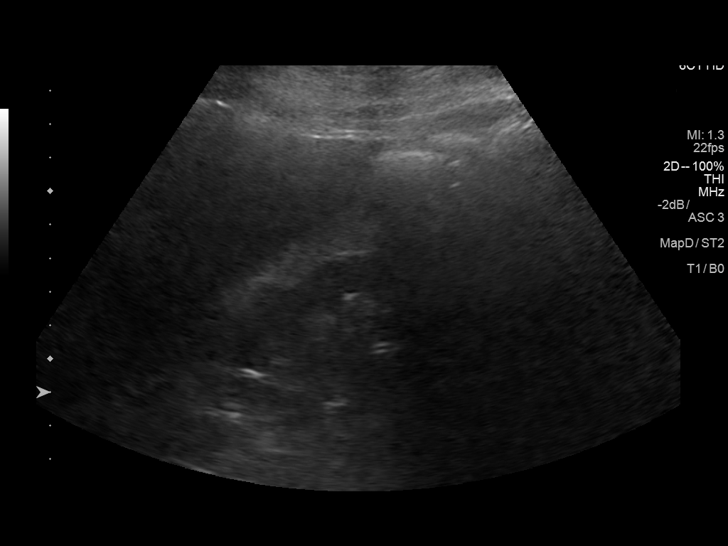
[im 6/35]
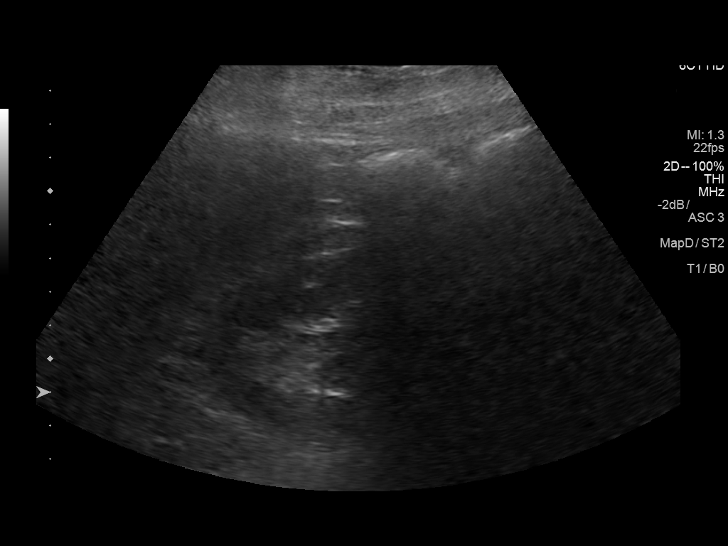
[im 9/35]
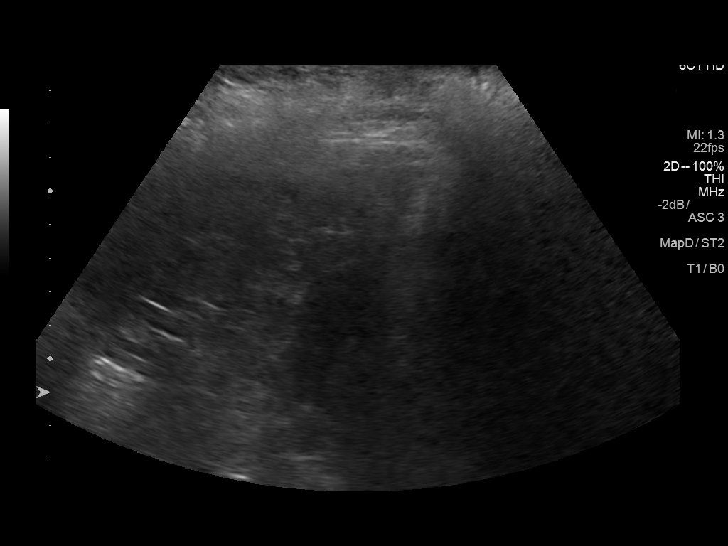
[im 12/35]
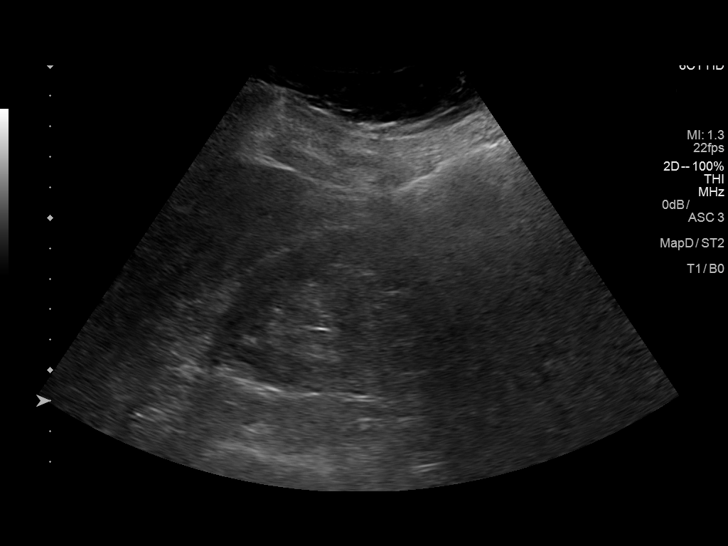
[im 13/35]
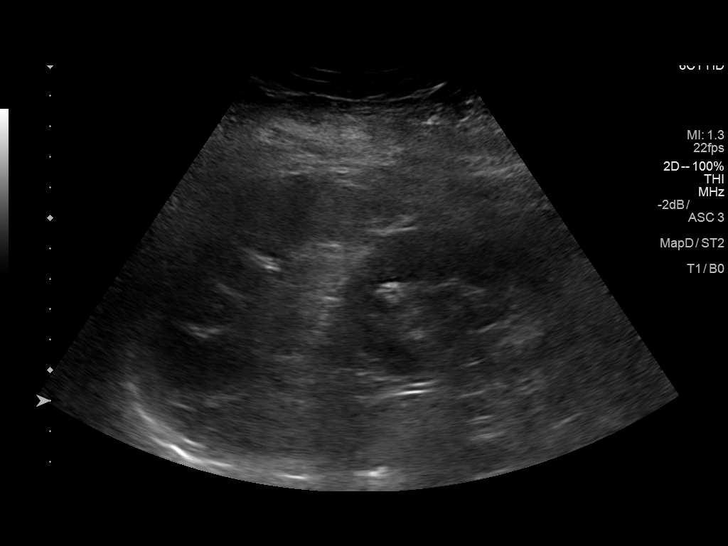
[im 16/35]
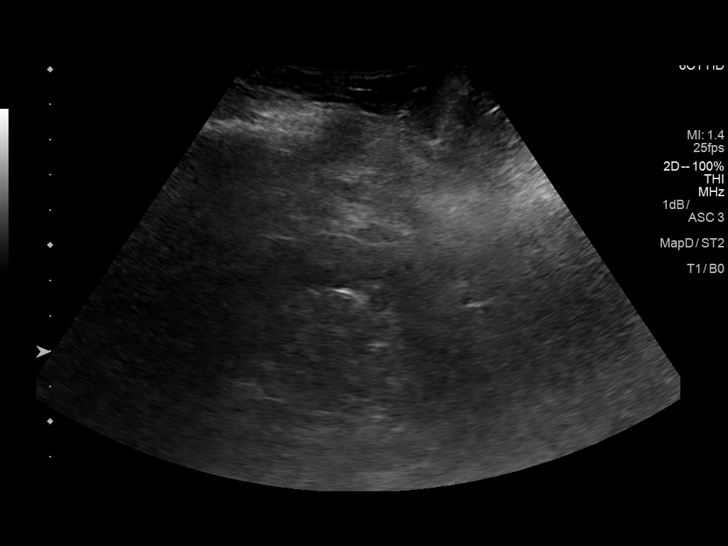
[im 19/35]
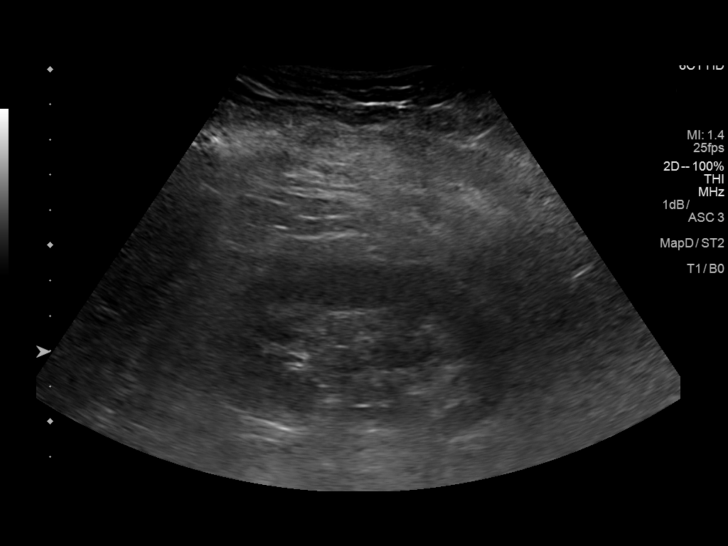
[im 22/35]
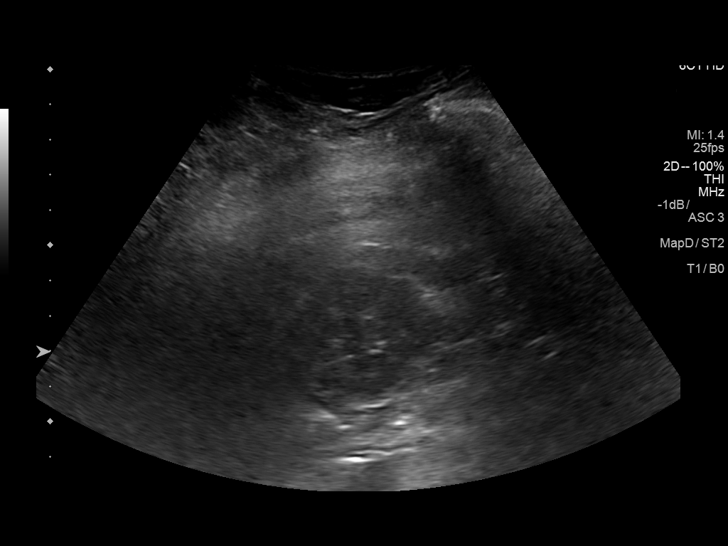
[im 23/35]
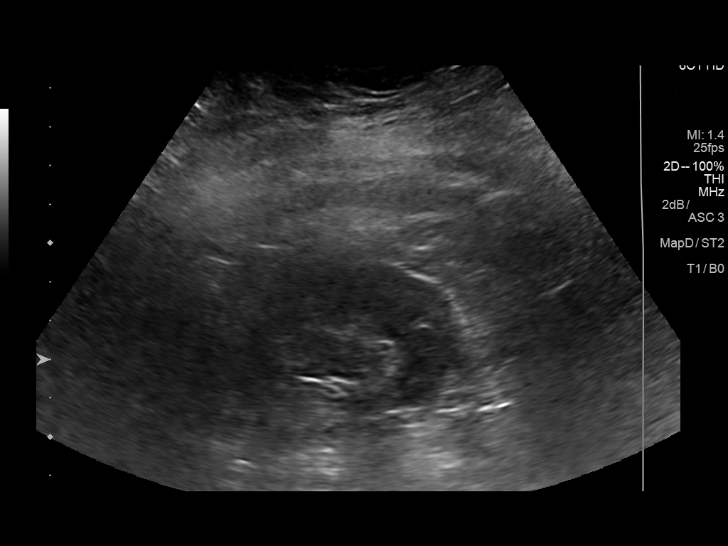
[im 26/35]
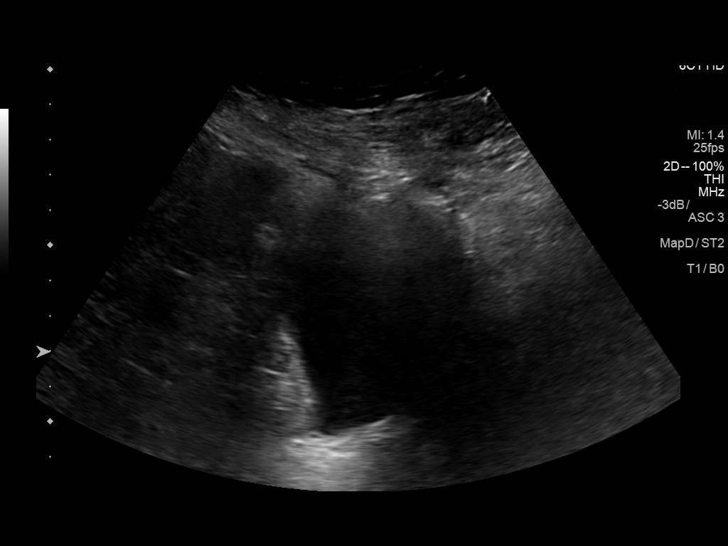
[im 29/35]
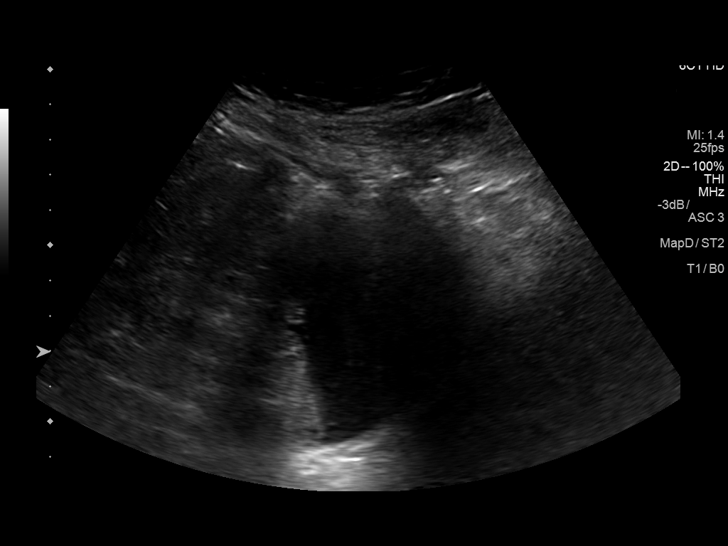
[im 32/35]
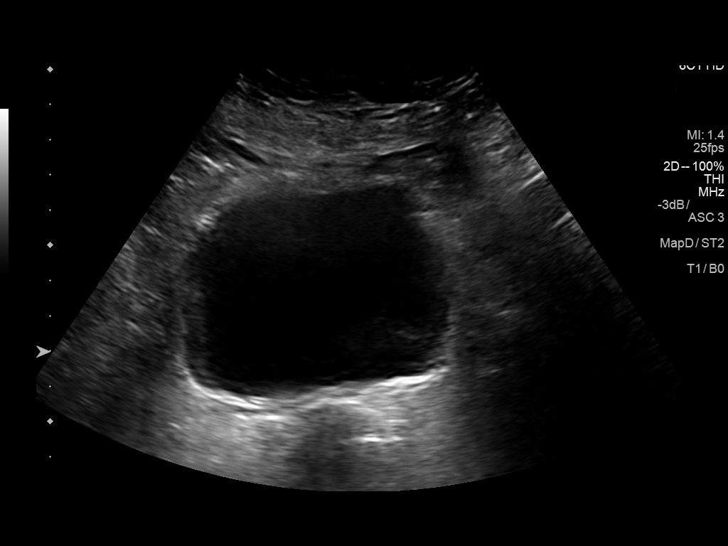
[im 35/35]
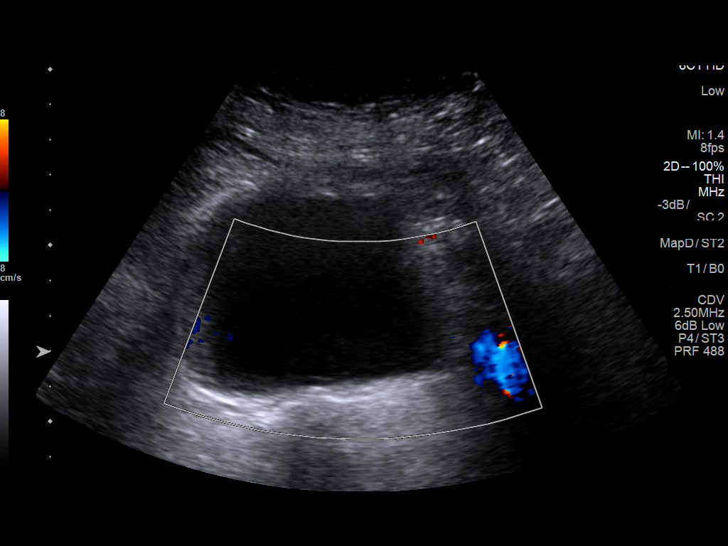

[14 of 25 positions shown; findings below may reference images not displayed]

FINDINGS: Right Kidney:

Length: 9.5 cm. The renal cortical echotexture remains lower than
that of the adjacent liver. There is no hydronephrosis. No definite
calcified stones are observed.

Left Kidney:

Length: 8.8 cm. The renal cortical echotexture is similar to that on
the right. There is no hydronephrosis.

Bladder:

The partially distended urinary bladder is grossly normal.
IMPRESSION: Small kidneys bilaterally. No significant cortical thinning or
echotexture increase. No hydronephrosis.

## 2018-02-17 ENCOUNTER — Other Ambulatory Visit: Payer: Self-pay | Admitting: Endocrinology

## 2018-02-18 ENCOUNTER — Encounter: Payer: Self-pay | Admitting: Nurse Practitioner

## 2018-02-18 ENCOUNTER — Other Ambulatory Visit: Payer: Self-pay | Admitting: *Deleted

## 2018-02-18 ENCOUNTER — Ambulatory Visit (INDEPENDENT_AMBULATORY_CARE_PROVIDER_SITE_OTHER): Payer: Medicare Other | Admitting: Nurse Practitioner

## 2018-02-18 VITALS — BP 138/50 | HR 74 | Ht 65.0 in | Wt 195.0 lb

## 2018-02-18 DIAGNOSIS — I5031 Acute diastolic (congestive) heart failure: Secondary | ICD-10-CM

## 2018-02-18 DIAGNOSIS — I5032 Chronic diastolic (congestive) heart failure: Secondary | ICD-10-CM | POA: Diagnosis not present

## 2018-02-18 DIAGNOSIS — I2510 Atherosclerotic heart disease of native coronary artery without angina pectoris: Secondary | ICD-10-CM | POA: Diagnosis not present

## 2018-02-18 DIAGNOSIS — I480 Paroxysmal atrial fibrillation: Secondary | ICD-10-CM | POA: Diagnosis not present

## 2018-02-18 MED ORDER — APIXABAN 5 MG PO TABS: 5.0000 mg | ORAL_TABLET | Freq: Two times a day (BID) | ORAL | 3 refills | Status: DC

## 2018-02-18 NOTE — Progress Notes (Signed)
CARDIOLOGY OFFICE NOTE  Date:  02/18/2018    Hannah [name] Date of Birth: 1946-04-04 Medical Record #[id number]  PCP:  Mackie Pai, PA-C  Cardiologist:  Atilano Median    Chief Complaint  Patient presents with  . Coronary Artery Disease  . Congestive Heart Failure    Post ER visit - seen for Dr. Lovena Le    History of Present Illness: Hannah [name] is a 72 y.o. female who presents today for a post ER visit. She isa former patient of Dr. Susa Simmonds. Seen for Dr. Aundra Dubin. She now follows with meand Dr. Lovena Le.   She has a history of CAD s/p CABG, bioprosthetic AVR, CKD,&paroxysmal atrial fibrillation. She had her initial CABG in 1998, then had redo CABG in 2009 with bioprosthetic AVR. She does not remember when she last had documented atrial fibrillation, but she thinks it was a number of years ago, soundedperi-operative around the time of her redo CABG. She was never put on anticoagulation other than Plavixbut had been on dronedaronein the past.   Over the years she has had intermittent issues. These have included tachypalpitations and chest pain. Last cath in 2014 - grafts patent. Myoview in 2015 without ischemia.Event monitor in 8/16 showed atrial tachycardia versus atypical atrial flutter. Toprol XL was increased.She saw Dr Lovena Le who thought that she had short runs of atrial tachycardia and not atrial flutter. Therefore, she had not been anticoagulated until here recently.   Wasdiagnosed with breast cancerin 2017/2018. Has had surgery.Gotchemo/radiation - no Adriamycin due to her history of CHF.Her course was complicated by volume overload and anemia.  In January of 2018began to haveorthostasis. She was placed on midodrine and her lasix was changed to as needed only.ACE and beta blocker stopped.Ended up getting on amiodarone in October of 2018 for palpitations/atrial arrhythmias. I  saw her in December - she was doing ok - BP started tracking way  back up with the Midodrine and we adjusted her medicines. She had a spell of syncope earlier this year and fell - broke her shoulder and sustained a large hematoma. She now has a loop in place.   She was admitted twice in March - recurrent syncope - her loop has been ok. EEG without seizure noted. She was placed on Florinef. She was taken off her Ultram and Gabapentin. She was not using Lasix.  I last saw her in April - she was feeling better. Saw Dr. Lovena Le in June - some PAF noted on her ILR - she was placed on Eliquis - aspirin/Plavix were stopped.   She has called a couple of times since - has had return of palpitations - needing to take more Metoprolol - subsequently seen with Dr. Lovena Le and we opted to increase her amiodarone to BID dosing.   Referred to the ER about 2 weeks ago - she was markedly short of breath over the phone - got IV lasix - then took her po Lasix for about 4 days - and then stopped.   Comes in today. Here with [name] today. She is doing better. We got her echo updated - has grade 2 DD and EF is still ok. No chest pain. She had been to beach - very little eating out but salt may have been a contributing factor. She is watching her weight at home. Asking about going back to the gym. Not dizzy or lightheaded. Her palpitations are much better - she is to cut her amiodarone back to just one a  day on October 1 per Dr. Lovena Le.   PMH: 1. Diabetes mellitus 2. HTN 3. Obesity 4. Atrial arrhythmias: Paroxysmal atrial fibrillation seems to have been post-operative after CAD-AVR. She has a history of amiodarone toxicity. She has not been on coumadin. 3-week event monitor (9/13) with no atrial fibrillation. 3-week event monitor (10/14) with runs of probable atrial tachycardia. 30 day monitor in 8/16 with atrial tachycardia (multiple episodes).  5. CAD: CABG 1998 with LIMA-LAD/diagonal, sequential SVG-OM1/OM2, SVG-RCA. Redo CABG 2009 with SVG-OM3, SVG-PDA. Lexiscan myoview  (9/13): EF 61%, no ischemia or infarction. LHC (10/14) with all grafts patent except the old SVG-RCA. There was 90% stenosis in the AV LCx before ungrafted MOM, but this was unchanged from prior study. Lexiscan Cardiolite (1/15) with no ischemia. She does not tolerate Imdur.  6. CKD 7. Bioprosthetic aortic valve: Echo (2011) with EF 50-55%, bioprosthetic aortic valve well-seated. Echo (2/13) with EF 60-65%, bioprosthetic aortic valve looked ok. Echo (9/14): EF 50-55%, mild LVH, bioprosthetic AVR with mean gradient 10, mild MR, moderate TR.  8. Asthma: since her teens. 9. Low back pain.  10. 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 Per neurologist, did not appear cardioembolic.  11. Carotid stenosis: 40-59% bilateral ICA stenosis on 10/14 carotid dopplers. Carotid doppler (11/15) with 40-59% bilateral ICA stenosis. Carotid dopplers (11/16) with 40-59% BICA stenosis.  12. Sleep study 12/14 without significant OSA.  13. Chronic diastolic CHF   Past Medical History:  Diagnosis Date  . Abnormally small mouth   . Acute on chronic combined systolic and diastolic congestive heart failure ([place]) 07/17/2013  . Adrenal insufficiency ([place]) 07/03/2016  . AKI (acute kidney injury) ([place]) 09/01/2017  . Allergic rhinitis 10/14/2009   Qualifier: Diagnosis of  By: Lamonte Sakai MD, Rose Fillers   Overview:  Overview:  Qualifier: Diagnosis of  By: Lamonte Sakai MD, Rose Fillers  Last Assessment & Plan:  Please continue Xyzal and Nasacort as you have been using them  . Anemia   . Asthma 05/12/2009   10/12/2014 p extensive coaching HFA effectiveness =    75% s spacer    Overview:  Overview:  10/12/2014 p extensive coaching HFA effectiveness =    75% s spacer   Last Assessment & Plan:  Please continue Symbicort 2 puffs twice a day. Remember to rinse and gargle after taking this medication. Take albuterol 2 puffs up to every 4 hours if needed for shortness of breath.  Follow with Dr Lamonte Sakai in 6 month  . Atrial tachycardia ([place]) 04/03/2013     Overview:  Last Assessment & Plan:  I have discussed the likely benign nature of her problem. She has very minimal palpitations. I doubt atrial flutter. I would not anti-coagulate. Her monitor demonstrates that her episodes are short lived. Will follow.   . Bilateral carotid artery stenosis    Bilateral ICA 40-59%/  >50% LECA  . Breast cancer ([place]) 01/18/2016   right breast  . Breast cancer of upper-inner quadrant of right female breast ([place]) 12/16/2015  . CAD (coronary artery disease) cardiologist-  dr Aundra Dubin   Remote CABG in 1998 with redo in 2009  . Chemotherapy induced nausea and vomiting 02/24/2016  . 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 CHF (congestive heart failure) ([facility])    EF is low normal at 50 to 55% per echo in Jan. 2011  . Chronic coronary artery disease 01/24/2011   Overview:  Last Assessment & Plan:  Stable with no ischemic symptoms.  Continue ASA, statin, ACEI, beta blocker.   . Chronic diastolic CHF (congestive heart failure) ([place]) 09/24/2013  . CKD (chronic kidney disease), stage IV ([place]) 09/24/2013  . Closed fracture of head of left humerus 09/09/2017  . Essential hypertension 05/12/2009   Qualifier: Diagnosis of  By: Lamonte Sakai MD, Rose Fillers   . GERD (gastroesophageal reflux disease)   . H/O atrial tachycardia 05/12/2009   Qualifier: History of  By: Lamonte Sakai MD, Rose Fillers   Overview:  Overview:  Qualifier: History of  By: Lamonte Sakai MD, Rose Fillers  Last Assessment & Plan:  There was no evidence of this on her cardiac monitor. I would recommend watchful waiting.   Marland Kitchen Hearing loss of right ear   . History of aortic valve replacement 10/27/2010   Overview:  Last Assessment & Plan:  Will get echo to reassess bioprosthetic aortic valve.   Marland Kitchen History of non-ST elevation myocardial infarction (NSTEMI)    Sept 2014--  thought to be type II HTN w/ LHC without infarct related artery and patent grafts  . History of radiation  therapy 05/24/16-07/26/16   right breast 50.4 Gy in 28 fractions, right breast boost 10 Gy in 5 fractions  . Hyperlipidemia   . Hypertension    Resolved.  . Iron deficiency anemia   . Mania ([place]) 03/05/2016  . Moderate persistent asthma    pulmologist-  Dr. Malvin Johns  . Nonischemic cardiomyopathy ([place])   . Osteomyelitis of toe of left foot ([place]) 04/06/2016  . Osteopenia of multiple sites 10/19/2015  . PAF (paroxysmal atrial fibrillation) ([place])   . Personal history of chemotherapy 2017  . Personal history of radiation therapy 2017  . Port catheter in place 02/17/2016  . Psoriasis    right leg  . Renal calculus, right   . Renal insufficiency, mild   . S/P AVR    prosthesis valve placement 2009 at same time re-do CABG  . Seizure ([place]) 06/29/2016  . Seizure-like activity ([place]) 09/01/2017  . Sensorineural hearing loss (SNHL) of both ears 01/06/2016  . Stroke [facility])    residual rt hearing loss  . Syncope 09/06/2017  . Type 2 diabetes mellitus ([place])    monitored by dr Dwyane Dee    Past Surgical History:  Procedure Laterality Date  . AORTIC VALVE REPLACEMENT  2009   [facility]done same time is CABG)  . BREAST LUMPECTOMY Right 01/18/2016  . BREAST LUMPECTOMY WITH RADIOACTIVE SEED AND SENTINEL LYMPH NODE BIOPSY Right 01/18/2016   Procedure: RIGHT BREAST LUMPECTOMY WITH RADIOACTIVE SEED AND SENTINEL LYMPH NODE BIOPSY;  Surgeon: DAlphonsa Overall MD;  [facility]  Service: General;  Laterality: Right;  . CARDIAC CATHETERIZATION  03/23/2008   Pre-redo CABG: L main OK, LAD (T), CFX (T), OM1 99%, RCA (T), LIMA-LAD OK, SVG-OM(?3) OK w/ little florw to OM2, SVG-RCA OK. EF NL  . CARPAL TUNNEL RELEASE    . COLONOSCOPY    . CORONARY ARTERY BYPASS GRAFT  1998 &  re-do 2009   Had LIMA to DX/LAD, SVG to 2 marginal branches and [facility]; SVG to 3rd OM and PD at time of redo  . CYSTOSCOPY W/ URETERAL STENT PLACEMENT Right 12/20/2014   Procedure: CYSTOSCOPY WITH  RETROGRADE PYELOGRAM/URETERAL STENT PLACEMENT;  Surgeon: PCleon Gustin MD;  [facility]  Service: Urology;  Laterality: Right;  . EYE SURGERY Bilateral    cataracts  . HOLMIUM LASER APPLICATION Right 76/60/6301  Procedure:  HOLMIUM LASER LITHOTRIPSY;  Surgeon: Cleon Gustin, MD;  Location[facility];  Service: Urology;  Laterality: Right;  . LEFT HEART CATHETERIZATION WITH CORONARY/GRAFT ANGIOGRAM N/A 02/23/2013   Procedure: LEFT HEART CATHETERIZATION WITH Beatrix Fetters;  Surgeon: Blane Ohara, MD;  [facility];  Service: Cardiovascular;  Laterality: N/A;  . LOOP RECORDER INSERTION N/A 08/30/2017   Procedure: LOOP RECORDER INSERTION;  Surgeon: Evans Lance, MD;  Location: [facility];  Service: Cardiovascular;  Laterality: N/A;  . PORTACATH PLACEMENT Left 01/18/2016   Procedure: INSERTION PORT-A-CATH;  Surgeon: Alphonsa Overall, MD;  Location: [facility];  Service: General;  Laterality: Left;  . portacath removal    . TONSILLECTOMY    . TRANSTHORACIC ECHOCARDIOGRAM  02-24-2013      mild LVH,  ef 50-55%/  AV bioprosthesis was present with very mild stenosis and no regurg., mean grandient 52mHg, peak grandient 247mg /  mild MR/  mild LAE and RAE/  moderate TR  . TUBAL LIGATION       Medications: Current Meds  Medication Sig  . ACCU-CHEK GUIDE test strip Use to test blood sugar 3 times daily  . ACCU-CHEK GUIDE test strip Use to test blood sugar 3 times daily  . acetaminophen (TYLENOL) 325 MG tablet Take 2 tablets (650 mg total) by mouth every 6 (six) hours as needed for mild pain (or Fever >/= 101).  . Marland Kitchenlbuterol (PROVENTIL HFA;VENTOLIN HFA) 108 (90 Base) MCG/ACT inhaler Inhale 2 puffs into the lungs every 6 (six) hours as needed for wheezing or shortness of breath.  . Marland Kitchenmiodarone (PACERONE) 200 MG tablet Take 1 tablet (200 mg total) by mouth 2 (two) times daily.  . Marland Kitchenpixaban (ELIQUIS) 5 MG TABS tablet Take 1 tablet (5  mg total) by mouth 2 (two) times daily.  . budesonide-formoterol (SYMBICORT) 160-4.5 MCG/ACT inhaler Inhale 2 puffs into the lungs 2 (two) times daily.  . Dulaglutide (TRULICITY) 1.5 MGYY/4.8GNOPN Inject contents of pen once a week (Patient taking differently: Inject 1.5 mg into the skin every Friday. )  . exemestane (AROMASIN) 25 MG tablet TAKE ONE TABLET BY MOUTH DAILY AFTER BREAKFAST  . ezetimibe (ZETIA) 10 MG tablet Take 1 tablet (10 mg total) by mouth daily.  . fludrocortisone (FLORINEF) 0.1 MG tablet Take 1 tablet (0.1 mg total) by mouth daily.  . furosemide (LASIX) 20 MG tablet Take 1 tablet (20 mg total) by mouth daily as needed for fluid.  . Insulin Disposable Pump (V-GO 30) KIT 1 Device by Does not apply route daily. (Patient taking differently: 1 Device by Does not apply route continuous. )  . insulin lispro (HUMALOG KWIKPEN) 100 UNIT/ML KiwkPen 2-6 Units at bedtime as needed (CBG >200).   . insulin lispro (HUMALOG) 100 UNIT/ML injection INJECT 76 UNITS DAILY WITH  V-GO PUMP  . metoprolol tartrate (LOPRESSOR) 25 MG tablet May take a total of 4 tablets in a 24 hour period.  . ranitidine (ZANTAC) 75 MG tablet Take 75 mg by mouth as needed for heartburn.  . rosuvastatin (CRESTOR) 40 MG tablet TAKE ONE TABLET BY MOUTH DAILY (Patient taking differently: Take 40 mg by mouth daily. )  . traMADol (ULTRAM) 50 MG tablet Take 1 tablet (50 mg total) by mouth every 12 (twelve) hours as needed for severe pain.  . Marland Kitchenolpidem (AMBIEN) 5 MG tablet Take 1 tablet (5 mg total) by mouth at bedtime as needed for sleep.     Allergies: Allergies  Allergen Reactions  . Amoxicillin Rash and Other (See  Comments)    Has patient had a PCN reaction causing immediate rash, facial/tongue/throat swelling, SOB or lightheadedness with hypotension: No Has patient had a PCN reaction causing severe rash involving mucus membranes or skin necrosis: Yes Has patient had a PCN reaction that required hospitalization No Has  patient had a PCN reaction occurring within the last 10 years: No If all of the above answers are "NO", then may proceed with Cephalosporin use.   . Imdur [Isosorbide Dinitrate] Other (See Comments)    Headache/severe hypotension/Syncope  . Tape     Must use paper tape  . Arimidex [Anastrozole] Nausea Only  . Latex Itching  . Tetracycline Rash    Social History: The patient  reports that she has never smoked. She has never used smokeless tobacco. She reports that she does not drink alcohol or use drugs.   Family History: The patient's family history includes Diabetes in her maternal grandmother and son; Healthy in her brother; Heart attack in her brother; Heart disease in her brother, father, and maternal grandmother; Heart failure in her father.   Review of Systems: Please see the history of present illness.   Otherwise, the review of systems is positive for none.   All other systems are reviewed and negative.   Physical Exam: VS:  BP (!) 138/50 (BP Location: Left Arm, Patient Position: Sitting, Cuff Size: Normal)   Pulse 74   Ht _0  (1.651 m)   Wt 195 lb (88.5 kg)   SpO2 99% Comment: at rest  BMI 32.45 kg/m  .  BMI Body mass index is 32.45 kg/m.  Wt Readings from Last 3 Encounters:  02/18/18 195 lb (88.5 kg)  02/12/18 193 lb (87.5 kg)  02/04/18 197 lb (89.4 kg)    General: Pleasant. Well developed, well nourished and in no acute distress.   HEENT: Normal.  Neck: Supple, no JVD, carotid bruits, or masses noted.  Cardiac: Regular rate and rhythm. Outflow murmur. No edema.  Respiratory:  Lungs are clear to auscultation bilaterally with normal work of breathing.  GI: Soft and nontender.  MS: No deformity or atrophy. Gait and ROM intact.  Skin: Warm and dry. Color is normal.  Neuro:  Strength and sensation are intact and no gross focal deficits noted.  Psych: Alert, appropriate and with normal affect.   LABORATORY DATA:  EKG:  EKG is not ordered today.  Lab Results   Component Value Date   WBC 7.4 02/04/2018   HGB 10.0 (L) 02/04/2018   HCT 33.3 (L) 02/04/2018   PLT 131 (L) 02/04/2018   GLUCOSE 245 (H) 02/07/2018   CHOL 109 08/01/2017   TRIG 90.0 08/01/2017   HDL 42.30 08/01/2017   LDLDIRECT 53.0 10/08/2016   LDLCALC 48 08/01/2017   ALT 38 (H) 12/25/2017   AST 41 (H) 12/25/2017   NA 140 02/07/2018   K 3.9 02/07/2018   CL 103 02/07/2018   CREATININE 1.91 (H) 02/07/2018   BUN 26 (H) 02/07/2018   CO2 32 02/07/2018   TSH 1.980 12/25/2017   INR 1.06 06/15/2013   HGBA1C 6.7 (H) 02/07/2018   MICROALBUR 13.3 (H) 02/14/2017     BNP (last 3 results) Recent Labs    09/06/17 1530 02/04/18 0933  BNP 58.0 222.2*    ProBNP (last 3 results) No results for input(s): PROBNP in the last 8760 hours.   Other Studies Reviewed Today:  Echo Study Conclusions 01/2018  - Left ventricle: The cavity size was normal. Systolic function was   normal.  The estimated ejection fraction was in the range of 60%   to 65%. Wall motion was normal; there were no regional wall   motion abnormalities. Features are consistent with a pseudonormal   left ventricular filling pattern, with concomitant abnormal   relaxation and increased filling pressure (grade 2 diastolic   dysfunction). Doppler parameters are consistent with high   ventricular filling pressure. - Aortic valve: A bioprosthesis was present and functioning   normally. Mean gradient (S): 16 mm Hg. - Mitral valve: Calcified annulus. There was mild regurgitation. - Left atrium: The atrium was moderately dilated. - Right ventricle: The cavity size was mildly dilated. Wall   thickness was normal. - Tricuspid valve: There was mild regurgitation. - Pulmonary arteries: Systolic pressure could not be accurately   estimated.  EchoStudy ConclusionsDecember 2018  - Left ventricle: The cavity size was normal. Wall thickness was increased in a pattern of severe LVH. Systolic function was normal. The  estimated ejection fraction was in the range of 60% to 65%. - Aortic valve: A bioprosthesis was present.   MYOVIEW FINDINGS FROM 06/2013: Normal resting EKG. Slight ST depressions noted in aVL after administration of lexiscan. Mild shortness of breath and nausea resolved after the test. EKG is nondiagnostic for ischemia. TID ratio 1.05. Lung-heart ratio 0.43. Normal ventricular chamber size.  IMPRESSION: No evidence for ischemia. Normal wall motion. LVEF 72%.   Electronically Signed By: Guy Sandifer On: 06/15/2013 14:27    Assessment/Plan:  1.Recent episode of acute diastolic HF - responded to IV diuresis - she hsa po Lasix on hand. See back as planned. Reminded about strict sodium restriction and daily weights. Will use po Lasix for weight gain of 2 or more pounds overnight. I will see back as planned next month.   2. Prior recurrent spells of palpitations - she opted for increased amiodarone over ablation. This seems better.   3. Unexplained syncope- she has ILR in place. She has had AF noted.  4.History of atrial tach  5.History oforthostasis/syncope- she remains on low dose Florinef - we will continue this for now.   6. CAD- prior CABG - no active chest pain. Would follow. HerlastLHC showed stable anatomy with patent grafts except for SVG-RCA from older CABG.   7. Anemia -recent CBC noted.  8. CKD- followed by nephrology - Dr. Posey Pronto   9. Carotid disease - will need repeat study 10/2018 - not discussed today.    Current medicines are reviewed with the patient today.  The patient does not have concerns regarding medicines other than what has been noted above.  The following changes have been made:  See above.  Labs/ tests ordered today include:   No orders of the defined types were placed in this encounter.    Disposition:   FU with me as planned in October.    Patient is agreeable to this plan and will call if any problems  develop in the interim.   SignedTruitt Merle, NP  02/18/2018 3:33 PM  [facility] [address] [place], [place]  [address] Phone: [phone] Fax: [phone]

## 2018-02-18 NOTE — Patient Instructions (Addendum)
We will be checking the following labs today - NONE   Medication Instructions:    Continue with your current medicines.   Use the Lasix for weight gain of 2 or more pounds overnight.     Testing/Procedures To Be Arranged:  N/A  Follow-Up:   See me as planned in October.     Other Special Instructions:   Ok to return to the gym    If you need a refill on your cardiac medications before your next appointment, please call your pharmacy.   Call the [facility] office at [phone] if you have any questions, problems or concerns.

## 2018-02-18 NOTE — Telephone Encounter (Signed)
Eliquis 5mg  refill request received; pt is 72 yrs old, Crea-1.91 on 02/07/18, Wt-87.5kg, last seen by Dr. Lovena Le on 01/24/18; will send in refill to requested pharmacy.

## 2018-02-19 LAB — CUP PACEART REMOTE DEVICE CHECK
Date Time Interrogation Session: 20190801160953
Implantable Pulse Generator Implant Date: 20190322

## 2018-02-24 ENCOUNTER — Ambulatory Visit: Payer: Medicare Other | Admitting: Cardiology

## 2018-03-01 ENCOUNTER — Encounter: Payer: Self-pay | Admitting: Medical

## 2018-03-05 ENCOUNTER — Ambulatory Visit (INDEPENDENT_AMBULATORY_CARE_PROVIDER_SITE_OTHER): Payer: Medicare Other | Admitting: Medical

## 2018-03-05 ENCOUNTER — Encounter: Payer: Self-pay | Admitting: Medical

## 2018-03-05 VITALS — BP 117/39 | HR 74 | Temp 98.7°F | Resp 16 | Ht 60.0 in | Wt 193.8 lb

## 2018-03-05 DIAGNOSIS — F419 Anxiety disorder, unspecified: Secondary | ICD-10-CM | POA: Diagnosis not present

## 2018-03-05 DIAGNOSIS — G4700 Insomnia, unspecified: Secondary | ICD-10-CM

## 2018-03-05 MED ORDER — DIAZEPAM 5 MG PO TABS: ORAL_TABLET | ORAL | 0 refills | Status: DC

## 2018-03-05 MED ORDER — ZOLPIDEM TARTRATE 5 MG PO TABS: ORAL_TABLET | ORAL | 0 refills | Status: DC

## 2018-03-05 NOTE — Progress Notes (Signed)
Subjective:    Patient ID: Hannah [name], female    DOB: 12/24/1945, 72 y.o.   MRN: [id number]  HPI  Pt in for follow up.  She states not sleeping well at night. She usually gets up at 7 am. Pt tries to fall asleep around 1 am. Pt does not nap usually. Pt states has been on ambien and getting about 8 hours of sleep a day in past. But one month ago she states would take and be able to go to sleep quickly. Now states take ambien at 10 am and then will go to sleep at 1 am.   Pt failed hydroxyzine and remeron. She could not sleep with either of these.  Pt states about 2 years ago had negative sleep study. She states not aware of any snoring.  Pt states that she might be anxious. She state will just lay there waiting to fall asleep.   Review of Systems  Constitutional: Negative for chills, fatigue and fever.  Respiratory: Negative for chest tightness, shortness of breath and stridor.   Cardiovascular: Negative for chest pain and palpitations.  Gastrointestinal: Negative for abdominal pain.  Musculoskeletal: Negative for back pain.  Skin: Negative for rash.  Neurological: Negative for dizziness, tremors, seizures, weakness and headaches.  Hematological: Negative for adenopathy. Does not bruise/bleed easily.  Psychiatric/Behavioral: Positive for sleep disturbance. Negative for behavioral problems and confusion. The patient is nervous/anxious.        Maybe some anxiety. She does describe some thoughts that might keep her up.  Worries about children and things they might do.   Past Medical History:  Diagnosis Date  . Abnormally small mouth   . Acute on chronic combined systolic and diastolic congestive heart failure ([place]) 07/17/2013  . Adrenal insufficiency ([place]) 07/03/2016  . AKI (acute kidney injury) ([place]) 09/01/2017  . Allergic rhinitis 10/14/2009   Qualifier: Diagnosis of  By: Lamonte Sakai MD, Rose Fillers   Overview:  Overview:  Qualifier: Diagnosis of  By: Lamonte Sakai MD, Rose Fillers  Last Assessment &  Plan:  Please continue Xyzal and Nasacort as you have been using them  . Anemia   . Asthma 05/12/2009   10/12/2014 p extensive coaching HFA effectiveness =    75% s spacer    Overview:  Overview:  10/12/2014 p extensive coaching HFA effectiveness =    75% s spacer   Last Assessment & Plan:  Please continue Symbicort 2 puffs twice a day. Remember to rinse and gargle after taking this medication. Take albuterol 2 puffs up to every 4 hours if needed for shortness of breath.  Follow with Dr Lamonte Sakai in 6 month  . Atrial tachycardia ([place]) 04/03/2013   Overview:  Last Assessment & Plan:  I have discussed the likely benign nature of her problem. She has very minimal palpitations. I doubt atrial flutter. I would not anti-coagulate. Her monitor demonstrates that her episodes are short lived. Will follow.   . Bilateral carotid artery stenosis    Bilateral ICA 40-59%/  >50% LECA  . Breast cancer ([place]) 01/18/2016   right breast  . Breast cancer of upper-inner quadrant of right female breast ([place]) 12/16/2015  . CAD (coronary artery disease) cardiologist-  dr Aundra Dubin   Remote CABG in 1998 with redo in 2009  . Chemotherapy induced nausea and vomiting 02/24/2016  . Chemotherapy-induced peripheral neuropathy ([place]) 04/27/2016  . 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 CHF (congestive heart failure) (H[facility])    EF is low normal at 50 to 55% per  echo in Jan. 2011  . Chronic coronary artery disease 01/24/2011   Overview:  Last Assessment & Plan:  Stable with no ischemic symptoms.  Continue ASA, statin, ACEI, beta blocker.   . Chronic diastolic CHF (congestive heart failure) ([place]) 09/24/2013  . CKD (chronic kidney disease), stage IV ([place]) 09/24/2013  . Closed fracture of head of left humerus 09/09/2017  . Essential hypertension 05/12/2009   Qualifier: Diagnosis of  By: Lamonte Sakai MD, Rose Fillers   . GERD (gastroesophageal reflux disease)   . H/O atrial tachycardia 05/12/2009   Qualifier: History of  By: Lamonte Sakai MD, Rose Fillers    Overview:  Overview:  Qualifier: History of  By: Lamonte Sakai MD, Rose Fillers  Last Assessment & Plan:  There was no evidence of this on her cardiac monitor. I would recommend watchful waiting.   Marland Kitchen Hearing loss of right ear   . History of aortic valve replacement 10/27/2010   Overview:  Last Assessment & Plan:  Will get echo to reassess bioprosthetic aortic valve.   Marland Kitchen History of non-ST elevation myocardial infarction (NSTEMI)    Sept 2014--  thought to be type II HTN w/ LHC without infarct related artery and patent grafts  . History of radiation therapy 05/24/16-07/26/16   right breast 50.4 Gy in 28 fractions, right breast boost 10 Gy in 5 fractions  . Hyperlipidemia   . Hypertension    Resolved.  . Iron deficiency anemia   . Mania ([place]) 03/05/2016  . Moderate persistent asthma    pulmologist-  Dr. Malvin Johns  . Nonischemic cardiomyopathy ([place])   . Osteomyelitis of toe of left foot ([place]) 04/06/2016  . Osteopenia of multiple sites 10/19/2015  . PAF (paroxysmal atrial fibrillation) ([place])   . Personal history of chemotherapy 2017  . Personal history of radiation therapy 2017  . Port catheter in place 02/17/2016  . Psoriasis    right leg  . Renal calculus, right   . Renal insufficiency, mild   . S/P AVR    prosthesis valve placement 2009 at same time re-do CABG  . Seizure ([place]) 06/29/2016  . Seizure-like activity ([place]) 09/01/2017  . Sensorineural hearing loss (SNHL) of both ears 01/06/2016  . Stroke [facility])    residual rt hearing loss  . Syncope 09/06/2017  . Type 2 diabetes mellitus ([place])    monitored by dr Dwyane Dee     Social History   Socioeconomic History  . Marital status: Married    Spouse name: Not on file  . Number of children: 2  . Years of education: Not on file  . Highest education level: Not on file  Occupational History  . Occupation: retired  Scientific laboratory technician  . Financial resource strain: Not on file  . Food insecurity:    Worry: Not on file    Inability: Not on file  . Transportation  needs:    Medical: Not on file    Non-medical: Not on file  Tobacco Use  . Smoking status: Never Smoker  . Smokeless tobacco: Never Used  Substance and Sexual Activity  . Alcohol use: No  . Drug use: No  . Sexual activity: Yes    Birth control/protection: None  Lifestyle  . Physical activity:    Days per week: Not on file    Minutes per session: Not on file  . Stress: Not on file  Relationships  . Social connections:    Talks on phone: Not on file    Gets together: Not on file  Attends religious service: Not on file    Active member of club or organization: Not on file    Attends meetings of clubs or organizations: Not on file    Relationship status: Not on file  . Intimate partner violence:    Fear of current or ex partner: Not on file    Emotionally abused: Not on file    Physically abused: Not on file    Forced sexual activity: Not on file  Other Topics Concern  . Not on file  Social History Narrative  . Not on file    Past Surgical History:  Procedure Laterality Date  . AORTIC VALVE RE[facility]ACEMENT  [facility]   [facility] same [facility] is [facility]  . BREAST LUMPECTOMY Right 01/18/2016  . BREAST LUMPECTOMY WITH RADIOACTIVE SEED AND SENTINEL LYMPH NODE BIOPSY Right 01/18/2016   Procedure: RIGHT BREAST LUMPECTOMY WITH RADIOACTIVE SEED AND SENTINEL LYMPH NODE BIOPSY;  Surgeon: DAlphonsa Overall MD;  [facility]  Service: General;  Laterality: Right;  . CARDIAC CATHETERIZATION  03/23/2008   Pre-redo CABG: L main OK, LAD (T), CFX (T), OM1 99%, RCA (T), LIMA-LAD OK, SVG-OM(?3) OK w/ little florw to OM2, SVG-RCA OK. EF NL  . CARPAL TUNNEL RELEASE    . COLONOSCOPY    . CORONARY ARTERY BYPASS GRAFT  1998 &  re-do 2009   Had LIMA to DX/LAD, SVG to 2 marginal branches and [facility]; SVG to 3rd OM and PD at time of redo  . CYSTOSCOPY W/ URETERAL STENT PLACEMENT Right 12/20/2014   Procedure: CYSTOSCOPY WITH RETROGRADE PYELOGRAM/URETERAL STENT  PLACEMENT;  Surgeon: PCleon Gustin MD;  [facility]  Service: Urology;  Laterality: Right;  . EYE SURGERY Bilateral    cataracts  . HOLMIUM LASER APPLICATION Right 77/41/2878  Procedure:  HOLMIUM LASER LITHOTRIPSY;  Surgeon: PCleon Gustin MD;  [facility]  Service: Urology;  Laterality: Right;  . LEFT HEART CATHETERIZATION WITH CORONARY/GRAFT ANGIOGRAM N/A 02/23/2013   Procedure: LEFT HEART CATHETERIZATION WITH CBeatrix Fetters  Surgeon: MBlane Ohara MD;  [facility];  Service: Cardiovascular;  Laterality: N/A;  . LOOP RECORDER INSERTION N/A 08/30/2017   Procedure: LOOP RECORDER INSERTION;  Surgeon: TEvans Lance MD;  [facility];  Service: Cardiovascular;  Laterality: N/A;  . PORTACATH PLACEMENT Left 01/18/2016   Procedure: INSERTION PORT-A-CATH;  Surgeon: DAlphonsa Overall MD;  [facility]  Service: General;  Laterality: Left;  . portacath removal    . TONSILLECTOMY    . TRANSTHORACIC ECHOCARDIOGRAM  02-24-2013      mild LVH,  ef 50-55%/  AV bioprosthesis was present with very mild stenosis and no regurg., mean grandient 142mg, peak grandient 2039m /  mild MR/  mild LAE and RAE/  moderate TR  . TUBAL LIGATION      Family History  Problem Relation Age of Onset  . Heart disease Father   . Heart failure Father   . Diabetes Maternal Grandmother   . Heart disease Maternal Grandmother   . Diabetes Son   . Healthy Brother        #1  . Heart attack Brother        #2  . Heart disease Brother        #2    Allergies  Allergen Reactions  . Amoxicillin Rash and Other (See Comments)    Has patient had a PCN reaction causing immediate  rash, facial/tongue/throat swelling, SOB or lightheadedness with hypotension: No Has patient had a PCN reaction causing severe rash involving mucus membranes or skin necrosis: Yes Has patient had a PCN reaction that required hospitalization No Has patient  had a PCN reaction occurring within the last 10 years: No If all of the above answers are "NO", then may proceed with Cephalosporin use.   . Imdur [Isosorbide Dinitrate] Other (See Comments)    Headache/severe hypotension/Syncope  . Tape     Must use paper tape  . Arimidex [Anastrozole] Nausea Only  . Latex Itching  . Tetracycline Rash    Current Outpatient Medications on File Prior to Visit  Medication Sig Dispense Refill  . ACCU-CHEK GUIDE test strip Use to test blood sugar 3 times daily  5  . ACCU-CHEK GUIDE test strip Use to test blood sugar 3 times daily 100 each 3  . acetaminophen (TYLENOL) 325 MG tablet Take 2 tablets (650 mg total) by mouth every 6 (six) hours as needed for mild pain (or Fever >/= 101).    Marland Kitchen albuterol (PROVENTIL HFA;VENTOLIN HFA) 108 (90 Base) MCG/ACT inhaler Inhale 2 puffs into the lungs every 6 (six) hours as needed for wheezing or shortness of breath.    Marland Kitchen amiodarone (PACERONE) 200 MG tablet Take 1 tablet (200 mg total) by mouth 2 (two) times daily. 90 tablet 0  . apixaban (ELIQUIS) 5 MG TABS tablet Take 1 tablet (5 mg total) by mouth 2 (two) times daily. 180 tablet 3  . budesonide-formoterol (SYMBICORT) 160-4.5 MCG/ACT inhaler Inhale 2 puffs into the lungs 2 (two) times daily. 10.2 g 2  . Dulaglutide (TRULICITY) 1.5 VC/9.4WH SOPN Inject contents of pen once a week (Patient taking differently: Inject 1.5 mg into the skin every Friday. ) 12 pen 1  . exemestane (AROMASIN) 25 MG tablet TAKE ONE TABLET BY MOUTH DAILY AFTER BREAKFAST 90 tablet 3  . ezetimibe (ZETIA) 10 MG tablet Take 1 tablet (10 mg total) by mouth daily. 90 tablet 3  . fludrocortisone (FLORINEF) 0.1 MG tablet Take 1 tablet (0.1 mg total) by mouth daily. 90 tablet 3  . furosemide (LASIX) 20 MG tablet Take 1 tablet (20 mg total) by mouth daily as needed for fluid. 30 tablet 0  . Insulin Disposable Pump (V-GO 30) KIT 1 Device by Does not apply route daily. (Patient taking differently: 1 Device by Does  not apply route continuous. ) 90 kit 3  . insulin lispro (HUMALOG KWIKPEN) 100 UNIT/ML KiwkPen 2-6 Units at bedtime as needed (CBG >200).     . insulin lispro (HUMALOG) 100 UNIT/ML injection INJECT 76 UNITS DAILY WITH  V-GO PUMP 70 mL 3  . metoprolol tartrate (LOPRESSOR) 25 MG tablet May take a total of 4 tablets in a 24 hour period. 30 tablet 3  . ranitidine (ZANTAC) 75 MG tablet Take 75 mg by mouth as needed for heartburn.    . rosuvastatin (CRESTOR) 40 MG tablet TAKE ONE TABLET BY MOUTH DAILY (Patient taking differently: Take 40 mg by mouth daily. ) 90 tablet 3  . zolpidem (AMBIEN) 5 MG tablet Take 1 tablet (5 mg total) by mouth at bedtime as needed for sleep. 30 tablet 1   No current facility-administered medications on file prior to visit.     BP (!) 117/39   Pulse 74   Temp 98.7 F (37.1 C) (Oral)   Resp 16   Ht 5' (1.524 m)   Wt 193 lb 12.8 oz (87.9 kg)   SpO2 99%  BMI 37.85 kg/m       Objective:   Physical Exam  General Mental Status- Alert. General Appearance- Not in acute distress.   Skin General: Color- Normal Color. Moisture- Normal Moisture.  Neck Carotid Arteries- Normal color. Moisture- Normal Moisture. No carotid bruits. No JVD.  Chest and Lung Exam Auscultation: Breath Sounds:-Normal.  Cardiovascular Auscultation:Rythm- Regular. Murmurs & Other Heart Sounds:Auscultation of the heart reveals- No Murmurs.  Abdomen Inspection:-Inspeection Normal. Palpation/Percussion:Note:No mass. Palpation and Percussion of the abdomen reveal- Non Tender, Non Distended + BS, no rebound or guarding.    Neurologic Cranial Nerve exam:- CN III-XII intact(No nystagmus), symmetric smile. Strength:- 5/5 equal and symmetric strength both upper and lower extremities.      Assessment & Plan:  For your history of insomnia with recent describe anxious thoughts at night, I do think it would be a good idea to try low-dose Valium at hour of sleep.  I recommend that you try  1/2 to 1 tablet right before sleeping.  Start out with low dose as directed.  Rx advisement given.  Also please remember to discontinue the Ambien completely as well as the tramadol.   I prescribed you limited 10 tablets.  We will assess your response to the medication.  If you do find that this is medication that does help you sleep as well as control of the anxious thoughts then might need to put you on a contract as well as get UDS if this will become a chronic/daily type medication.  Follow-up in 2 to 3 weeks or as needed.  Mackie Pai, PA-C

## 2018-03-05 NOTE — Patient Instructions (Signed)
For your history of insomnia with recent describe anxious thoughts at night, I do think it would be a good idea to try low-dose Valium at hour of sleep.  I recommend that you try 1/2 to 1 tablet right before sleeping.  Start out with low dose as directed.  Rx advisement given.  Also please remember to discontinue the Ambien completely as well as the tramadol.   I prescribed you limited 10 tablets.  We will assess your response to the medication.  If you do find that this is medication that does help you sleep as well as control of the anxious thoughts then might need to put you on a contract as well as get UDS if this will become a chronic/daily type medication.  Follow-up in 2 to 3 weeks or as needed.

## 2018-03-06 ENCOUNTER — Encounter: Payer: Self-pay | Admitting: Medical

## 2018-03-07 ENCOUNTER — Telehealth: Payer: Self-pay | Admitting: Medical

## 2018-03-07 ENCOUNTER — Other Ambulatory Visit: Payer: Self-pay | Admitting: Medical

## 2018-03-07 LAB — CUP PACEART REMOTE DEVICE CHECK
Date Time Interrogation Session: 20190903164017
Implantable Pulse Generator Implant Date: 20190322

## 2018-03-07 NOTE — Telephone Encounter (Signed)
dc'd valium.

## 2018-03-10 IMAGING — CR DG CHEST 2V
2 series · 2 of 2 positions shown · non-contrast
Comparison: 04/25/2016.

CLINICAL DATA: Cough, shortness of breath, chest tightness for the
past 2 days. Syncopal episode.

EXAM:
CHEST  2 VIEW

[w chest pa]
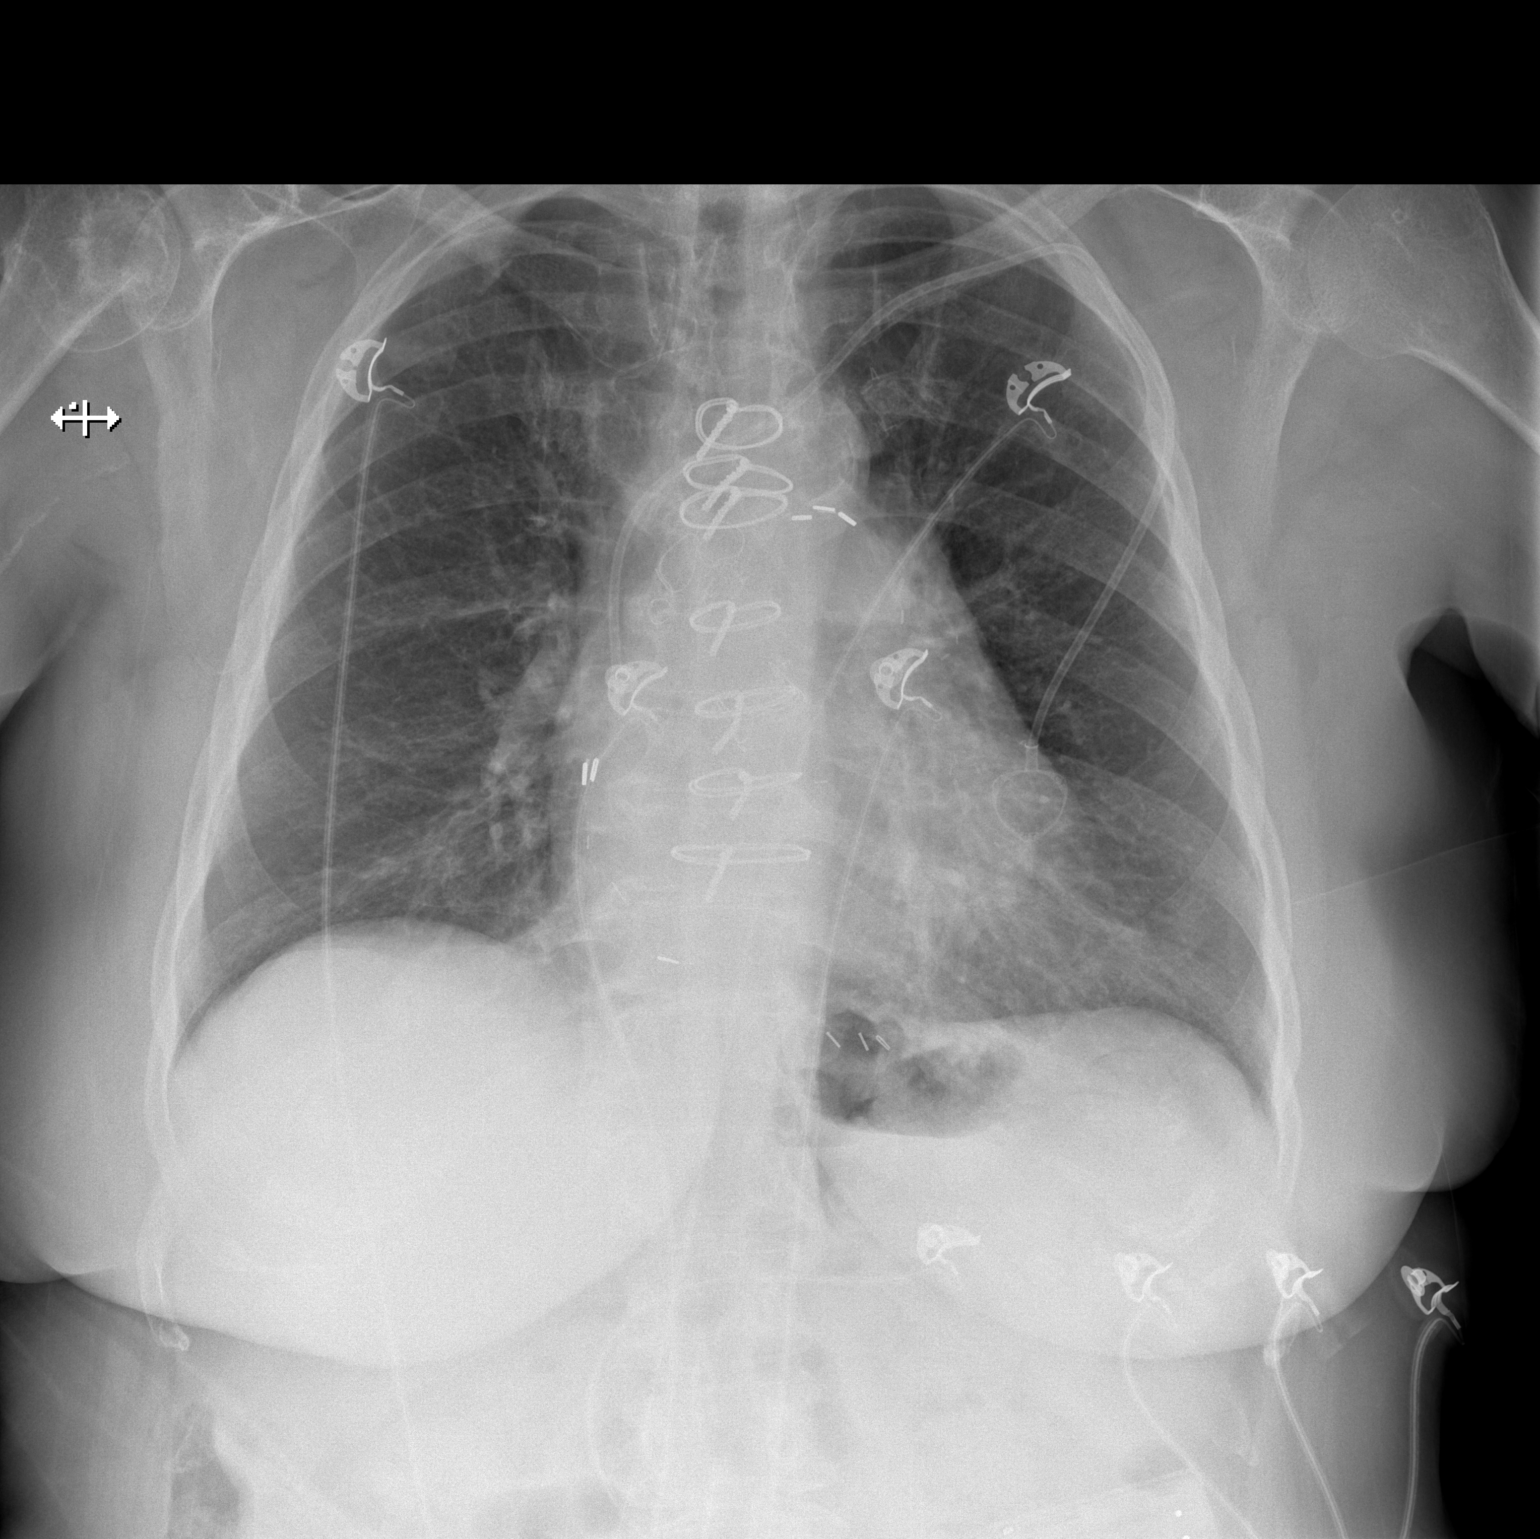

[w chest lat]
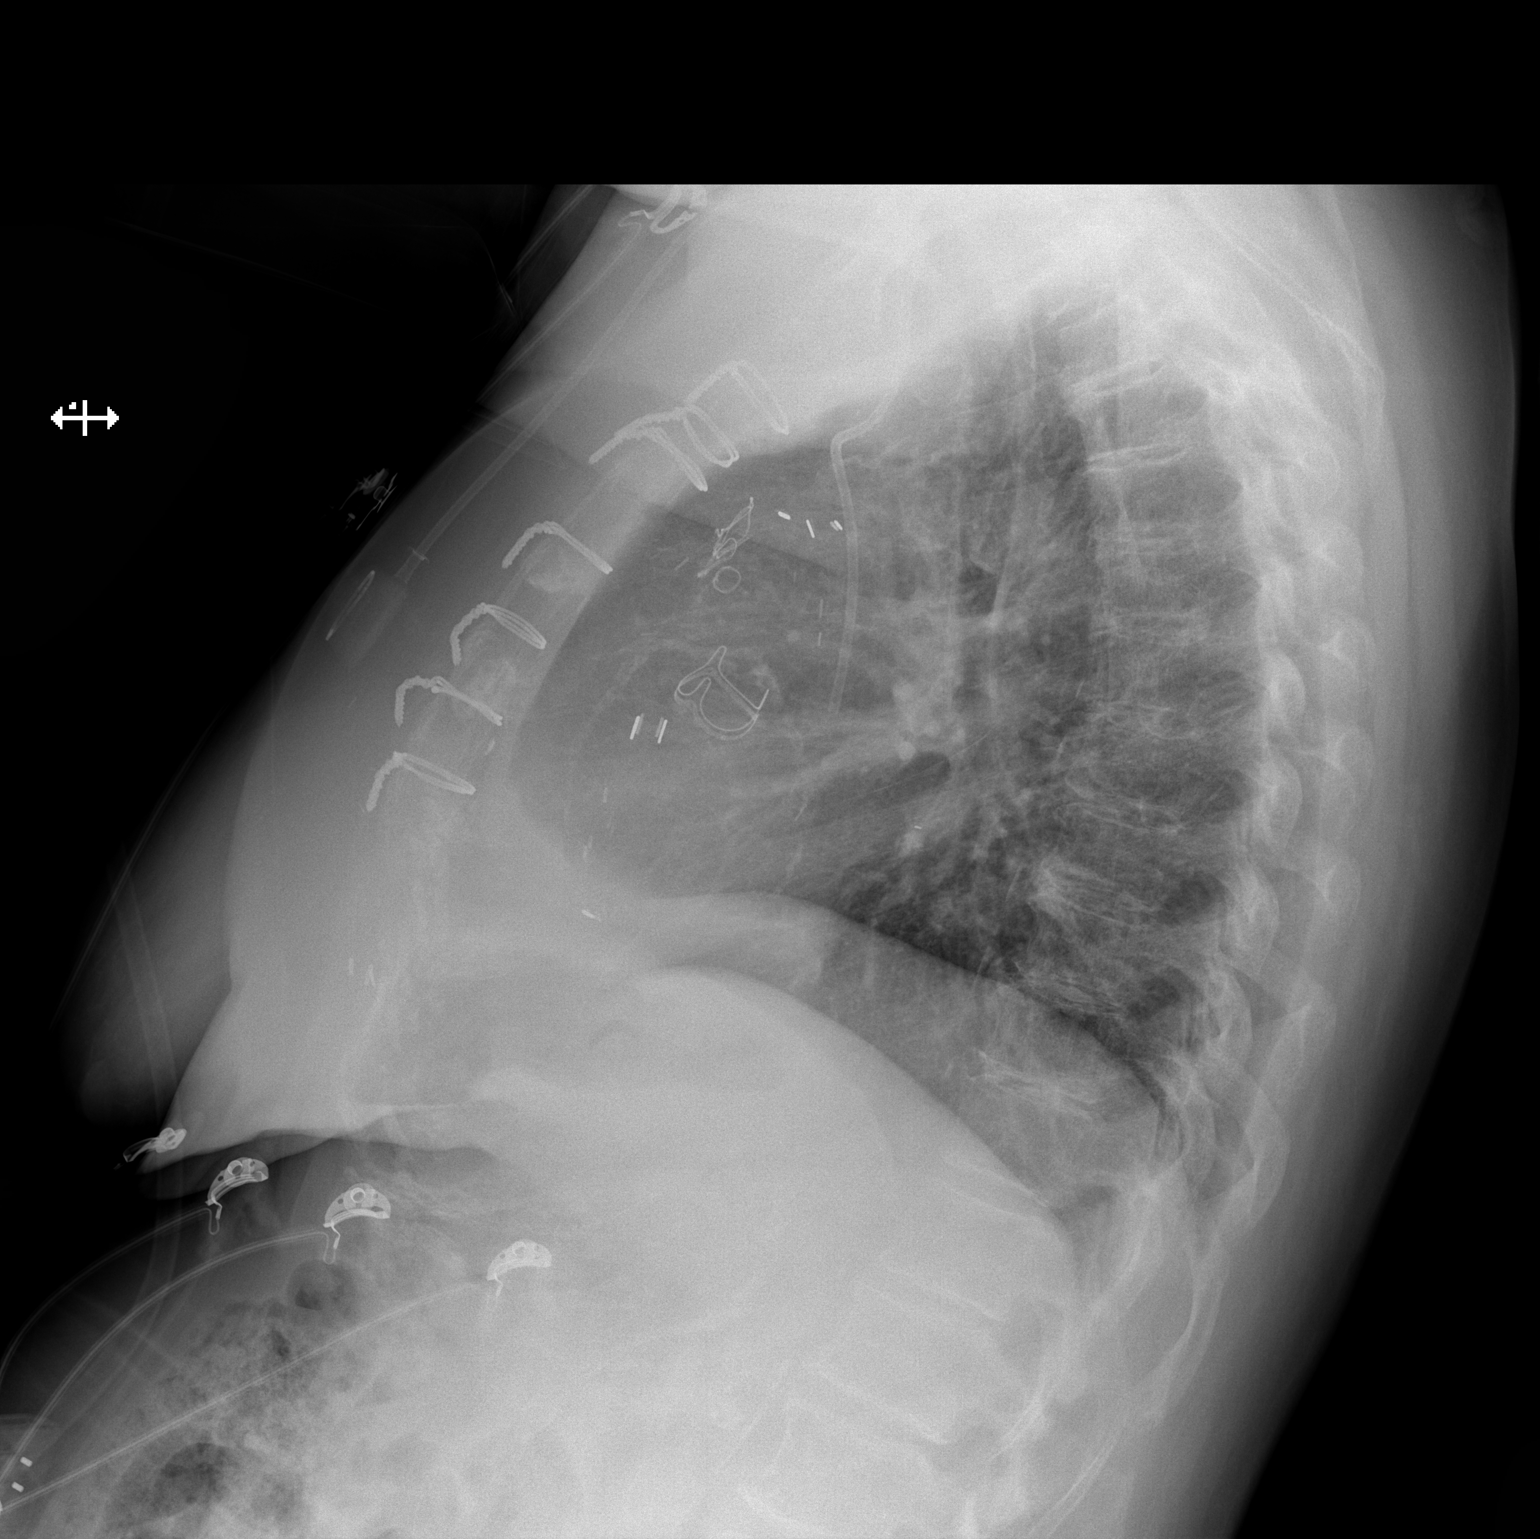

[2 of 2 positions shown; findings below may reference images not displayed]

FINDINGS: Normal sized heart with an interval decrease in size. Stable post
CABG changes and prosthetic aortic valve. Stable left subclavian
porta catheter. Clear lungs with normal vascularity. Mild thoracic
spine degenerative changes. Aortic calcifications. Old right humeral
neck fracture and avascular necrosis of the right humeral head.
IMPRESSION: No acute abnormality.  Aortic atherosclerosis.

## 2018-03-12 ENCOUNTER — Encounter: Payer: Self-pay | Admitting: Medical

## 2018-03-17 ENCOUNTER — Ambulatory Visit (INDEPENDENT_AMBULATORY_CARE_PROVIDER_SITE_OTHER): Payer: Medicare Other | Admitting: *Deleted

## 2018-03-17 DIAGNOSIS — R55 Syncope and collapse: Secondary | ICD-10-CM | POA: Diagnosis not present

## 2018-03-17 DIAGNOSIS — R002 Palpitations: Secondary | ICD-10-CM

## 2018-03-17 IMAGING — CT CT HEAD W/O CM
3 of 4 series · 14 of 47 positions shown, 16 images · non-contrast
Comparison: CT head 03/23/2010.

CLINICAL DATA: Seizure-like activity today. History of breast
cancer. Chemotherapy completed. Radiation therapy ongoing.

EXAM:
CT HEAD WITHOUT CONTRAST
TECHNIQUE: Contiguous axial images were obtained from the base of the skull
through the vertex without intravenous contrast.

[Series 2: head w/o · axial · non-contrast · 0.45mm/px · z∈[-156,-41]mm · 8 of 29 slices shown, 10 images]
[im 3/29  brain]
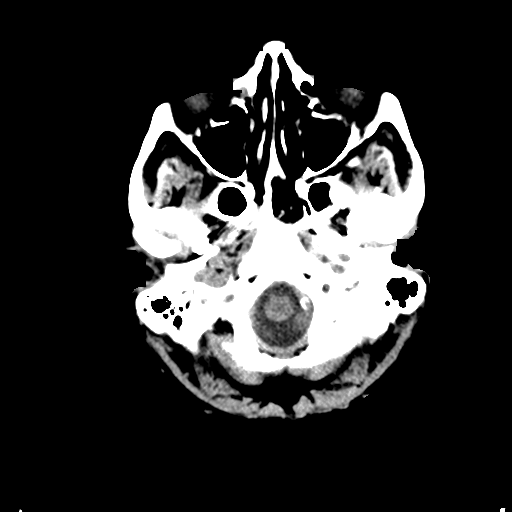
[im 3/29  bone]
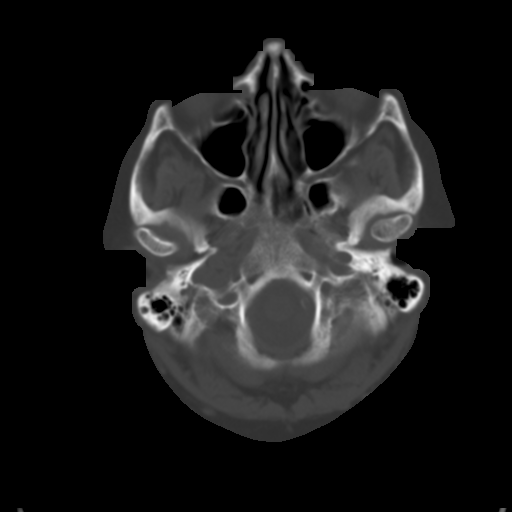
[im 6/29  brain]
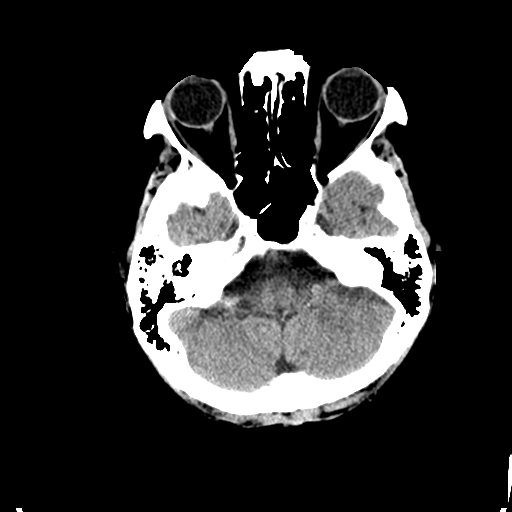
[im 9/29  brain]
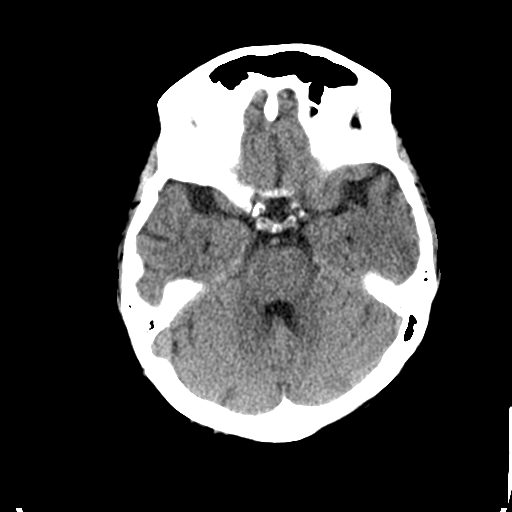
[im 12/29  brain]
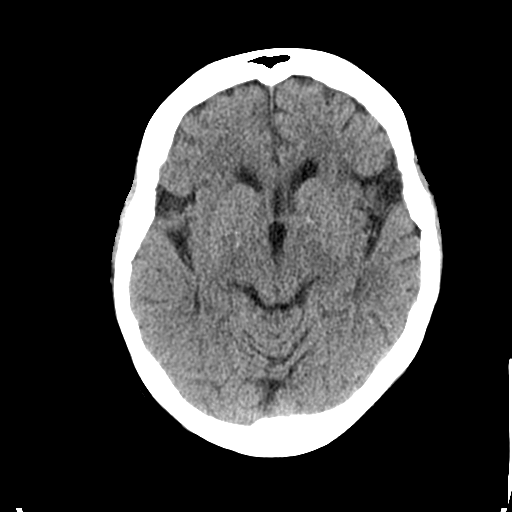
[im 17/29  brain]
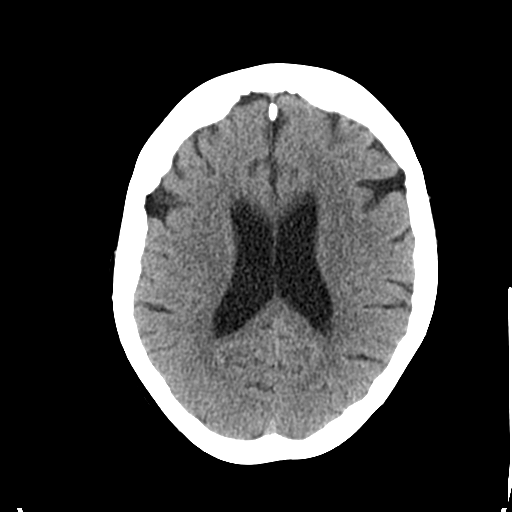
[im 17/29  bone]
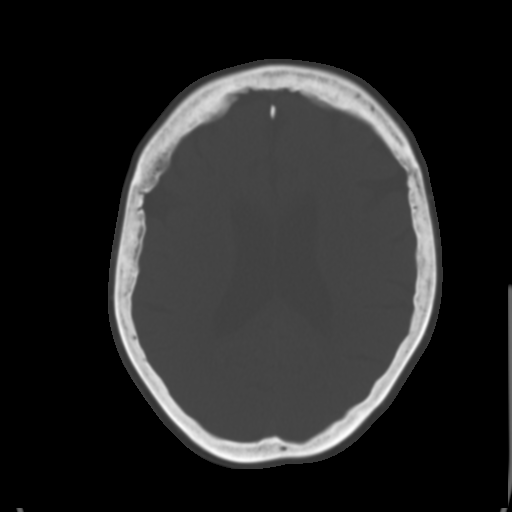
[im 20/29  brain]
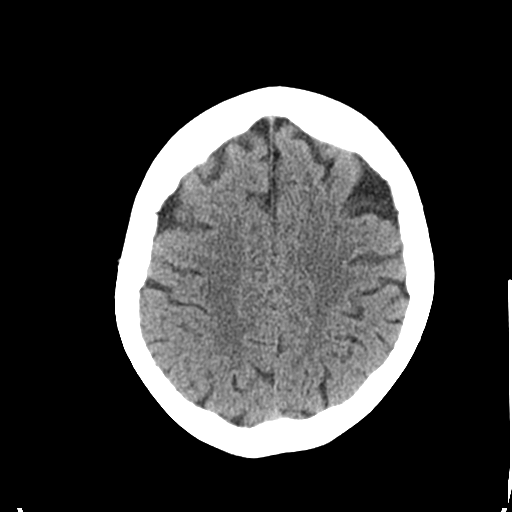
[im 23/29  brain]
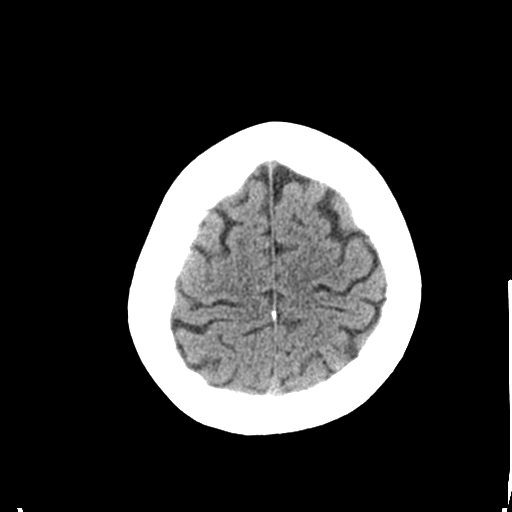
[im 26/29  brain]
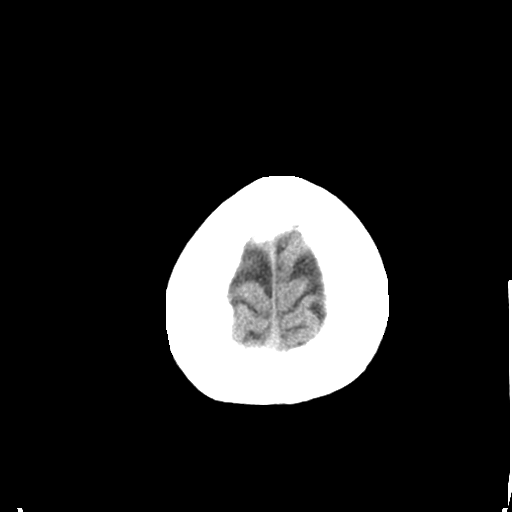

[Series 5: coronal · coronal · 0.27mm/px · 3 of 73 slices shown]
[im 25/73  brain]
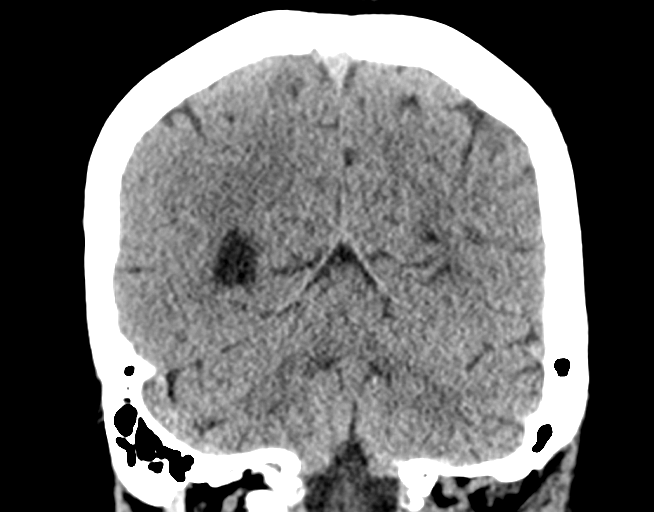
[im 33/73  brain]
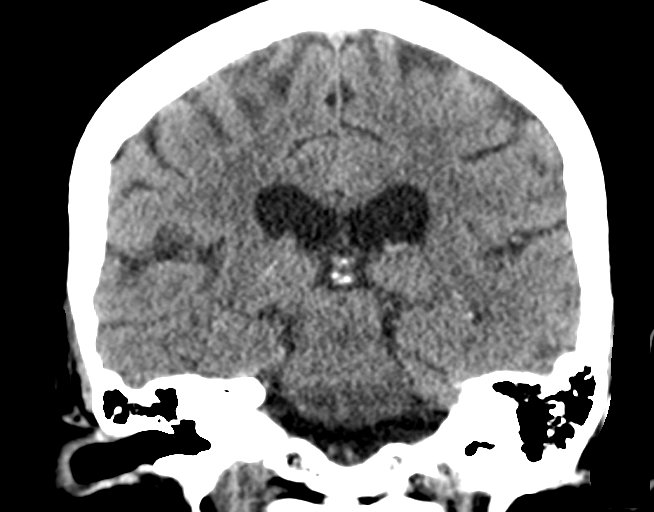
[im 41/73  brain]
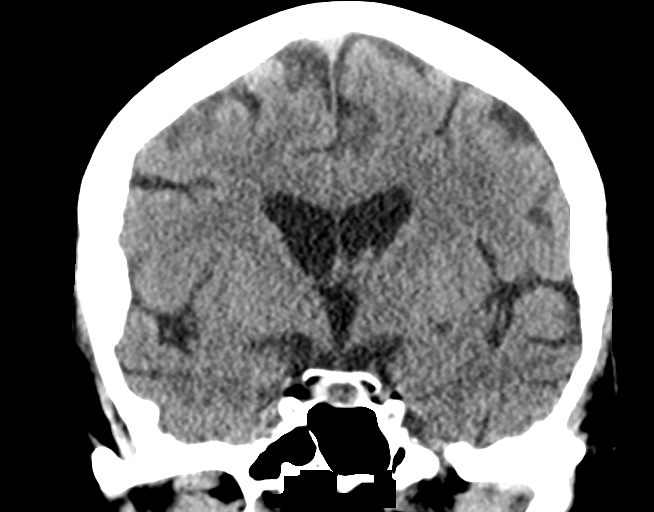

[Series 6: sagittal · sagittal · 0.28mm/px · 3 of 53 slices shown]
[im 18/53  brain]
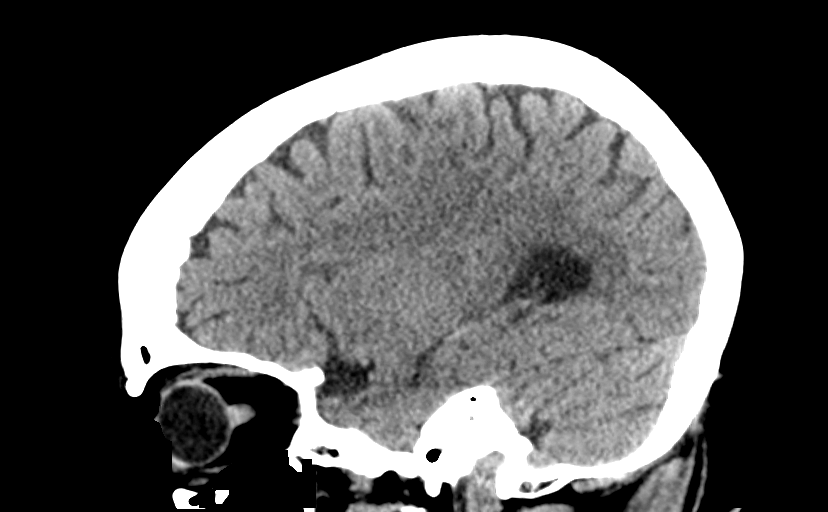
[im 27/53  brain]
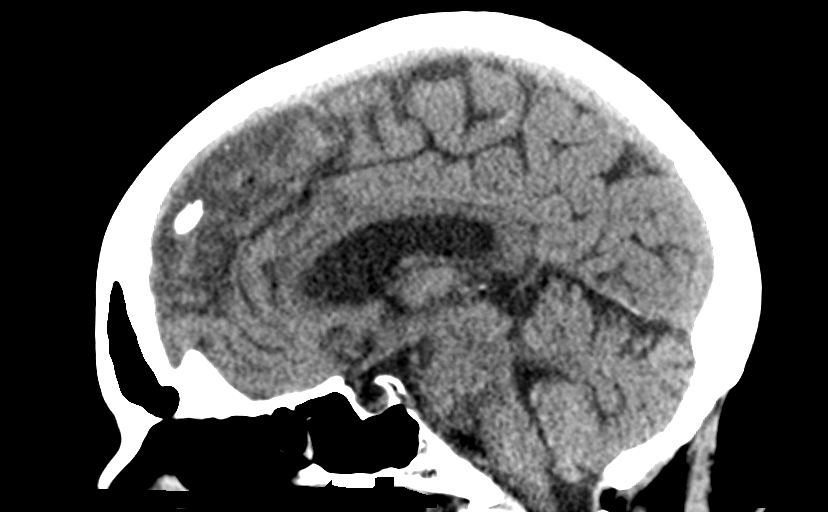
[im 35/53  brain]
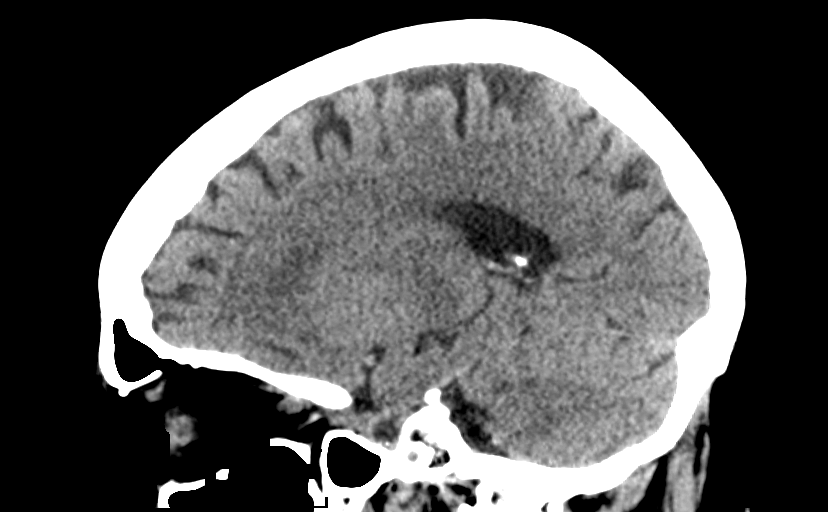

[14 of 47 positions shown; findings below may reference images not displayed]

FINDINGS: Brain: There is no evidence of acute intracranial hemorrhage, mass
lesion, brain edema or extra-axial fluid collection. Stable
mild/age-appropriate atrophy. There is no CT evidence of acute
cortical infarction.

Vascular: Intracranial vascular calcifications are present.

Skull: Stable calvarial hyperostosis. No focal lytic or blastic
lesion identified. No evidence of skull fracture.

Sinuses/Orbits: The visualized paranasal sinuses and mastoid air
cells are clear. No orbital abnormalities are seen.

Other: None.
IMPRESSION: Stable examination without acute findings or explanation for
seizures.

## 2018-03-17 IMAGING — CR DG CHEST 2V
2 series · 2 of 2 positions shown · non-contrast
Comparison: 06/22/2016 and 04/25/2016.

CLINICAL DATA: Seizures today with loss of consciousness.
Undergoing radiation for breast cancer.

EXAM:
CHEST  2 VIEW

[w chest lat]
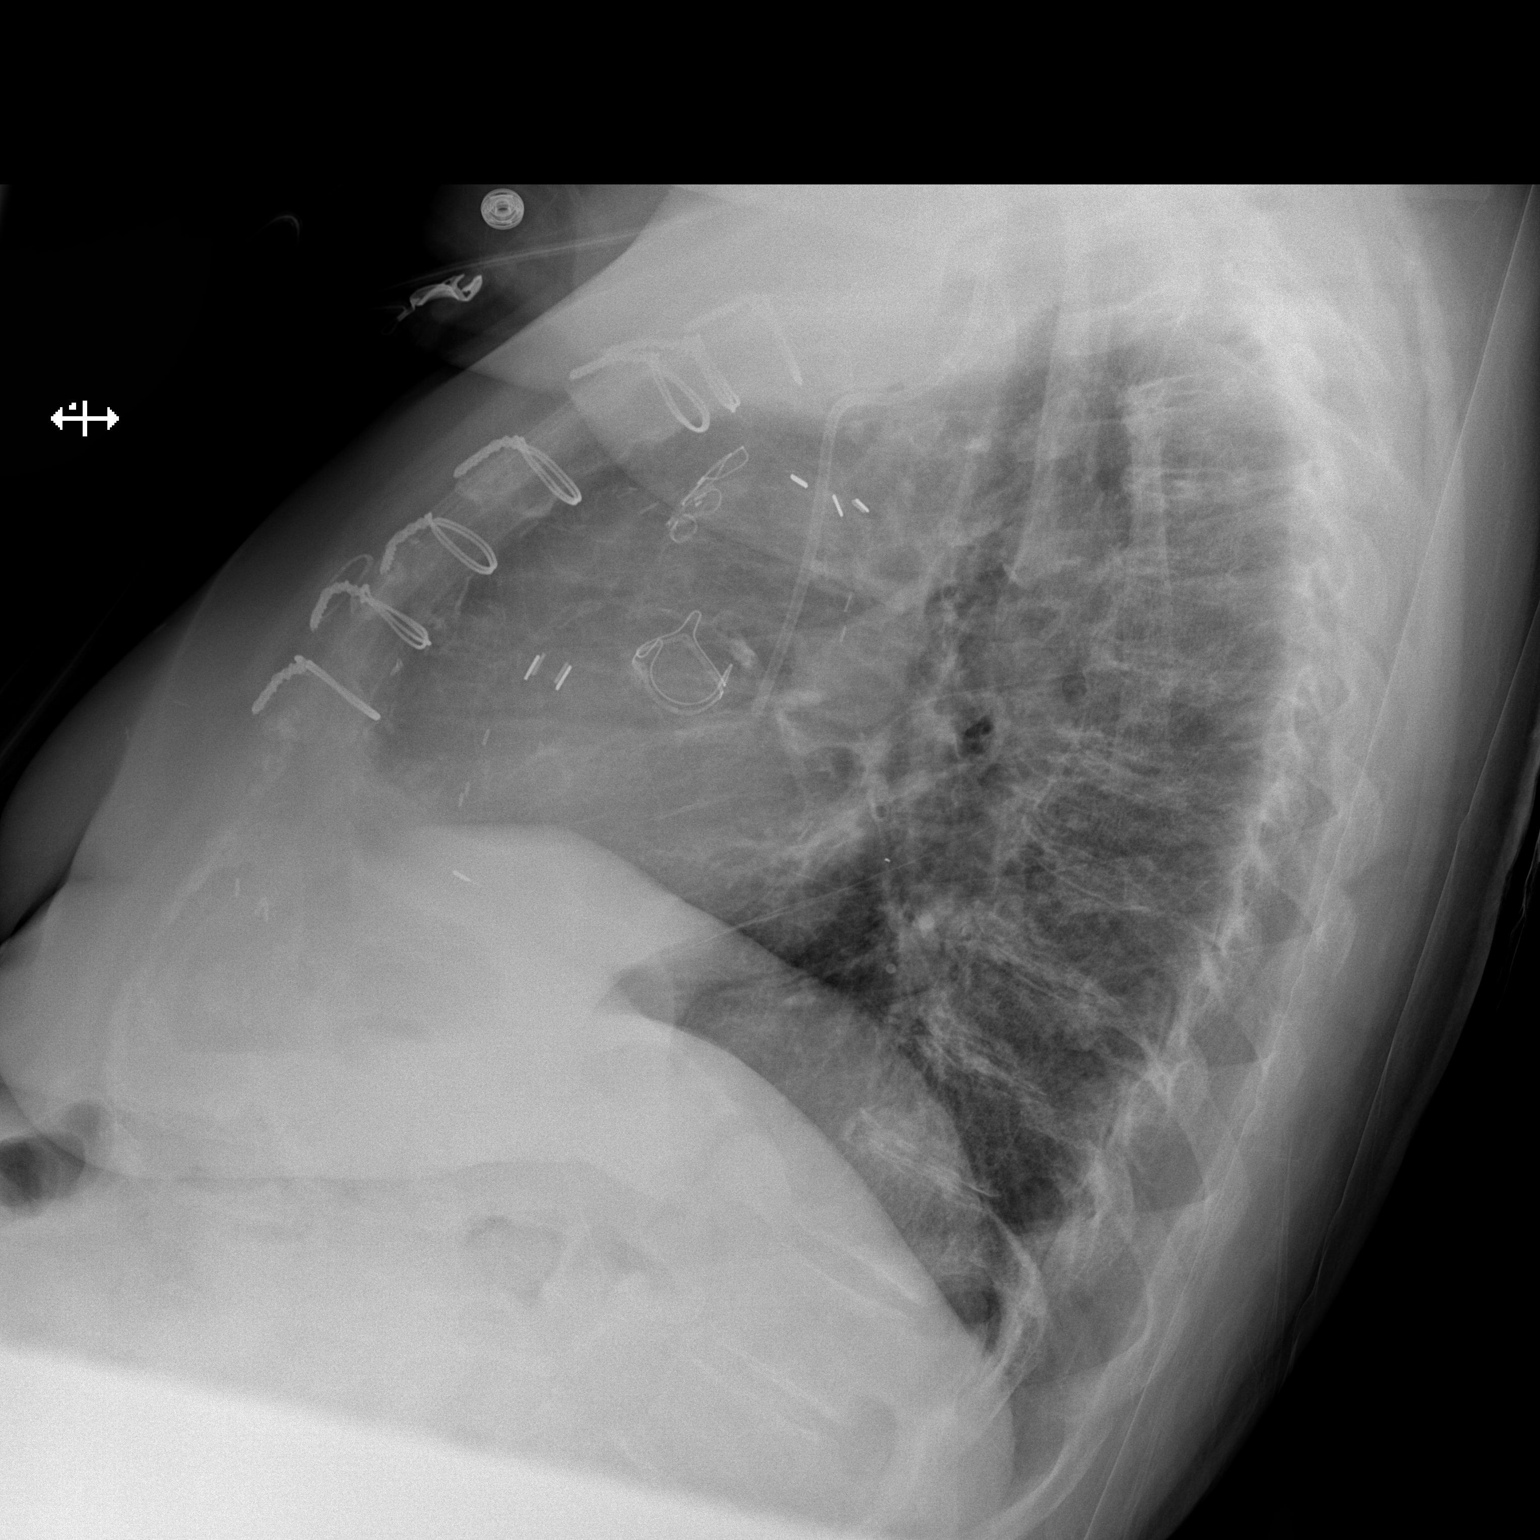

[x chest ap]
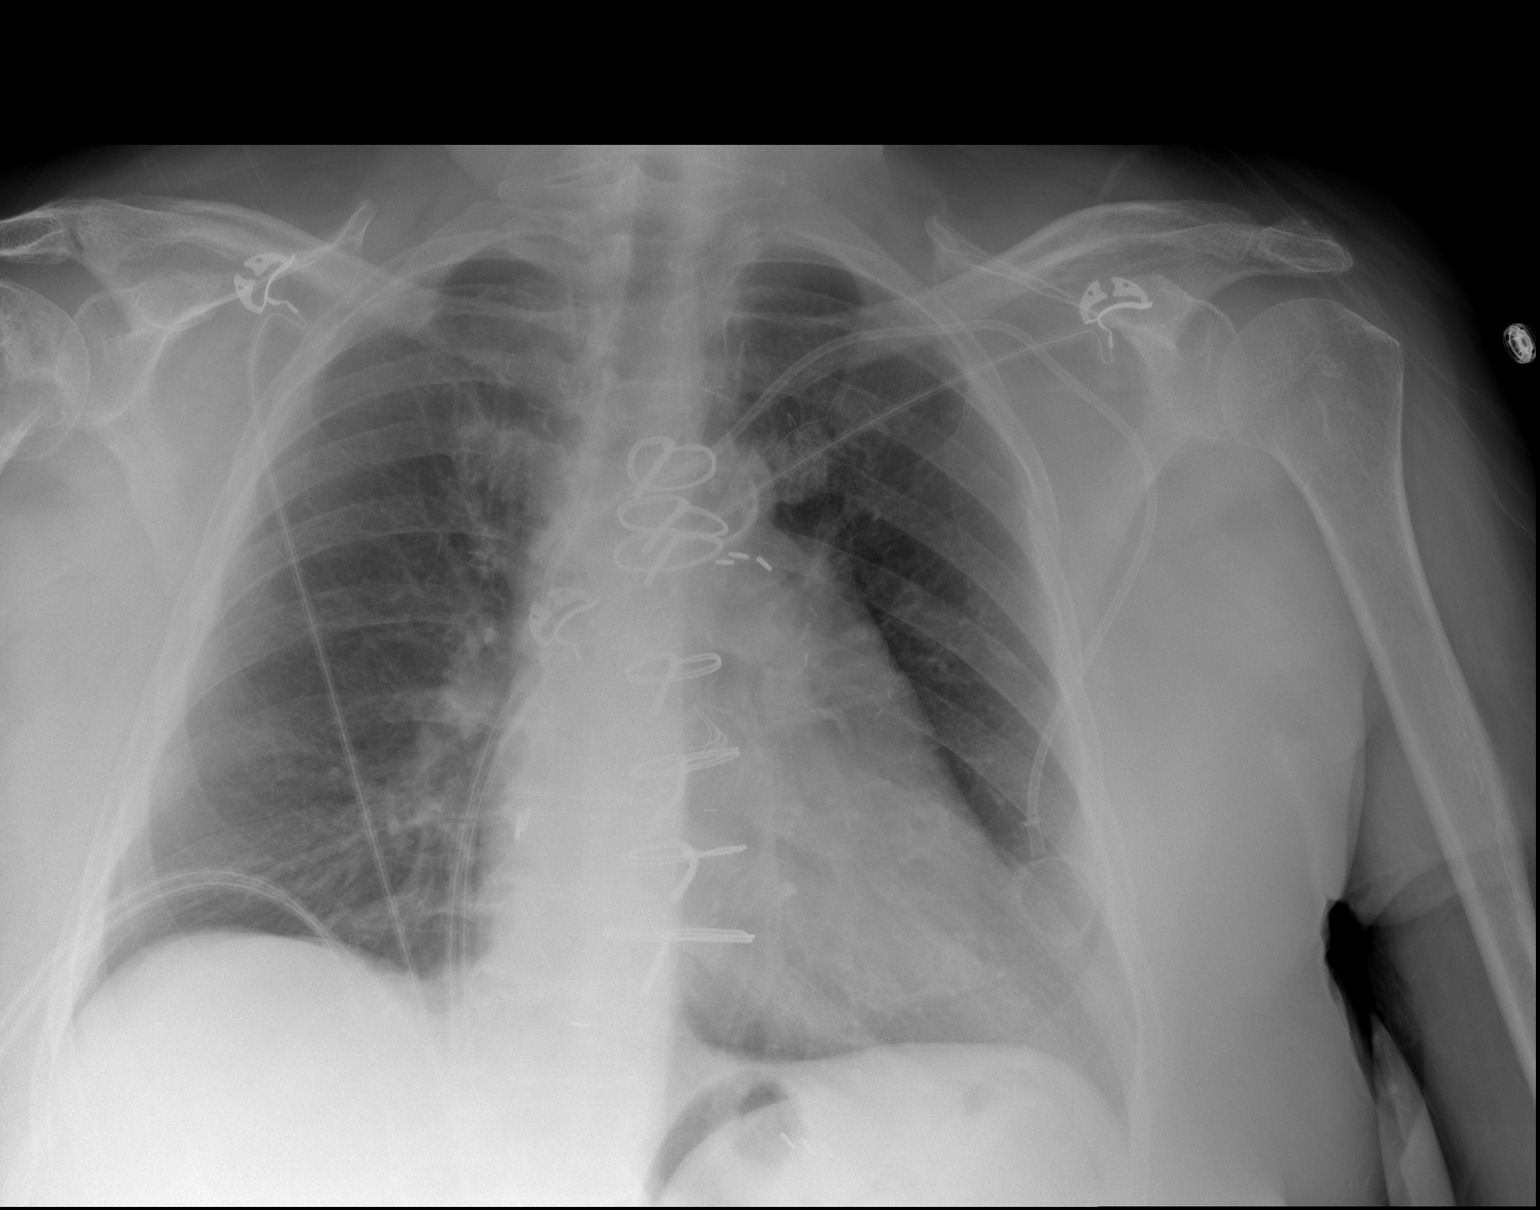

[2 of 2 positions shown; findings below may reference images not displayed]

FINDINGS: The left subclavian Port-A-Cath tip appears unchanged near the
cavoatrial junction. The heart size and mediastinal contours are
stable post CABG and aortic valve replacement. The lungs are clear.
There is no pleural effusion or pneumothorax. Old fracture of the
proximal right humerus noted. No worrisome osseous findings.
IMPRESSION: Stable postoperative chest. No acute findings or explanation for
seizures.

## 2018-03-17 NOTE — Progress Notes (Signed)
[facility] Summary Report / Loop Recorder 

## 2018-03-19 IMAGING — CR DG CERVICAL SPINE 2 OR 3 VIEWS
4 series · 4 of 4 positions shown · non-contrast
Comparison: MRI 06/29/2016.  CT 06/29/2016.

CLINICAL DATA: Headache.  Pain.  No injury.

EXAM:
CERVICAL SPINE - 2-3 VIEW

[c-spine lat]
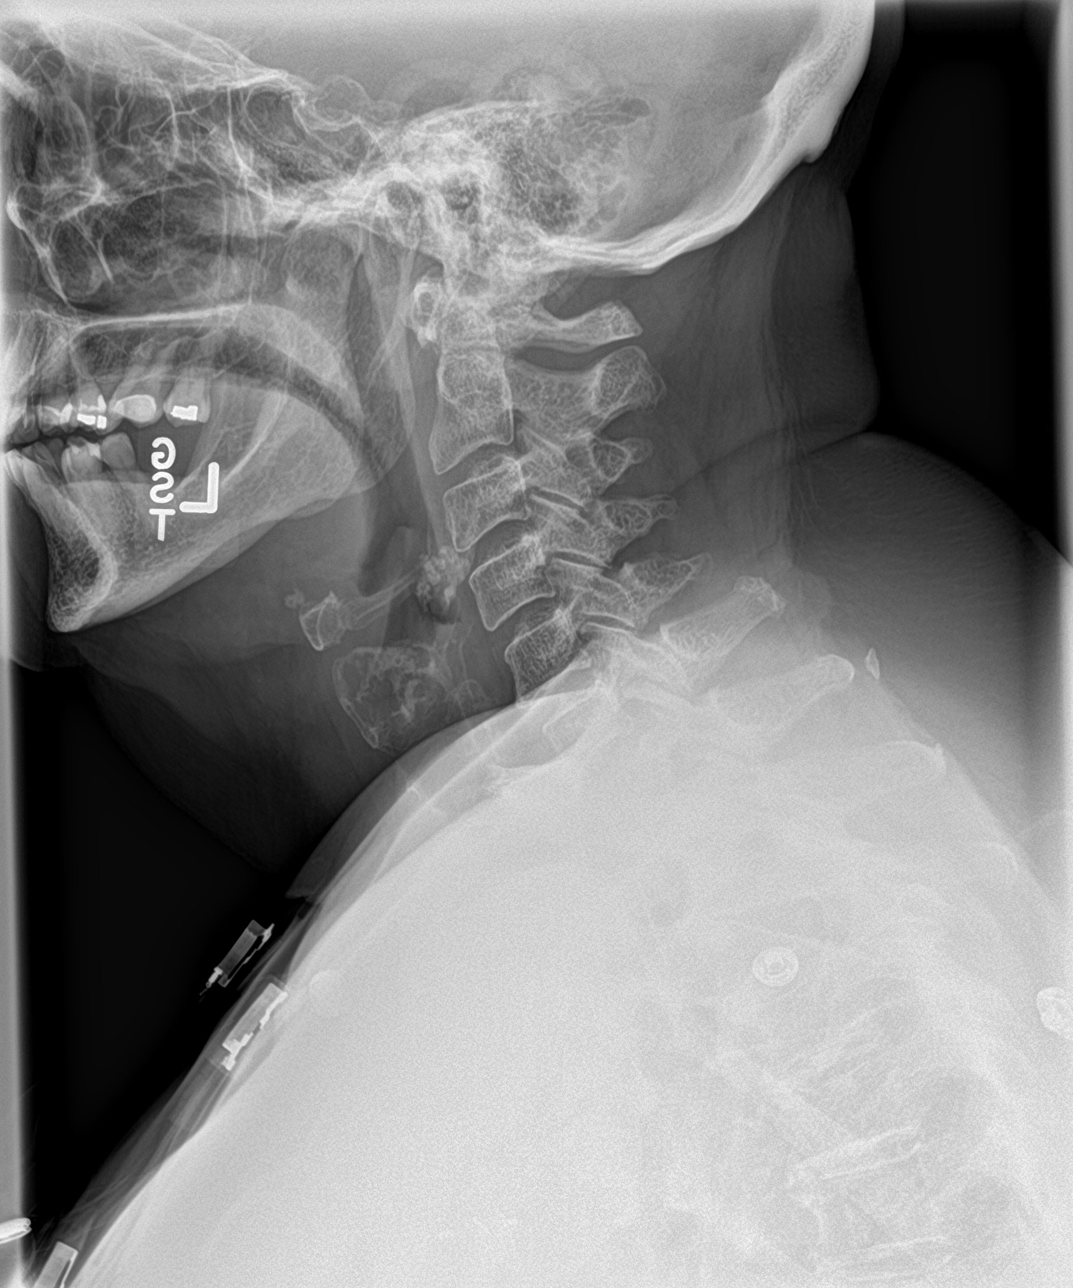

[c-spine ap]
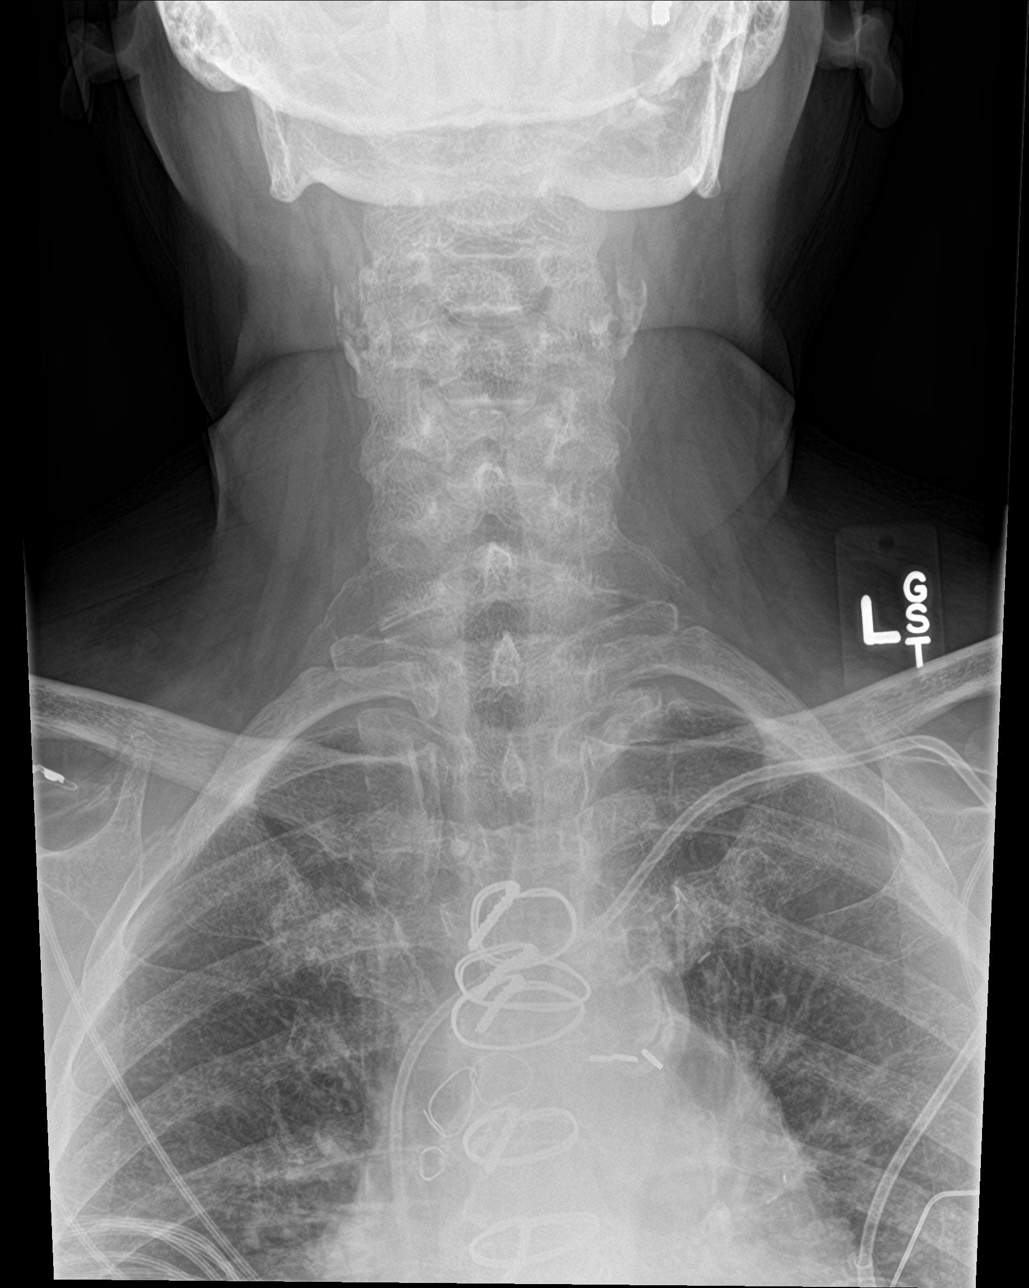

[c-spine open mouth]
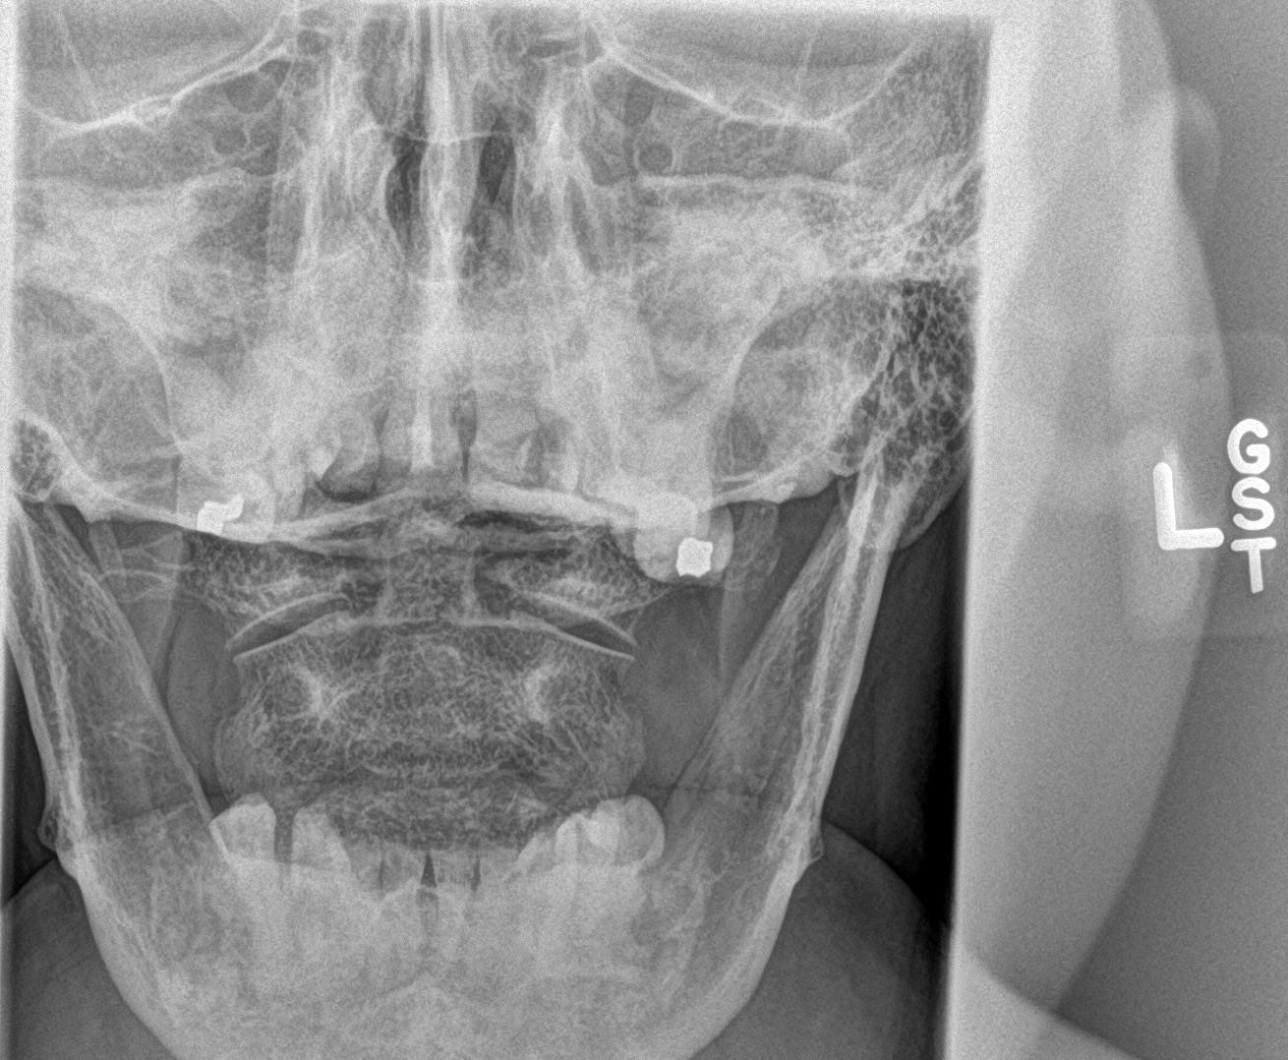

[c-spine swimmers]
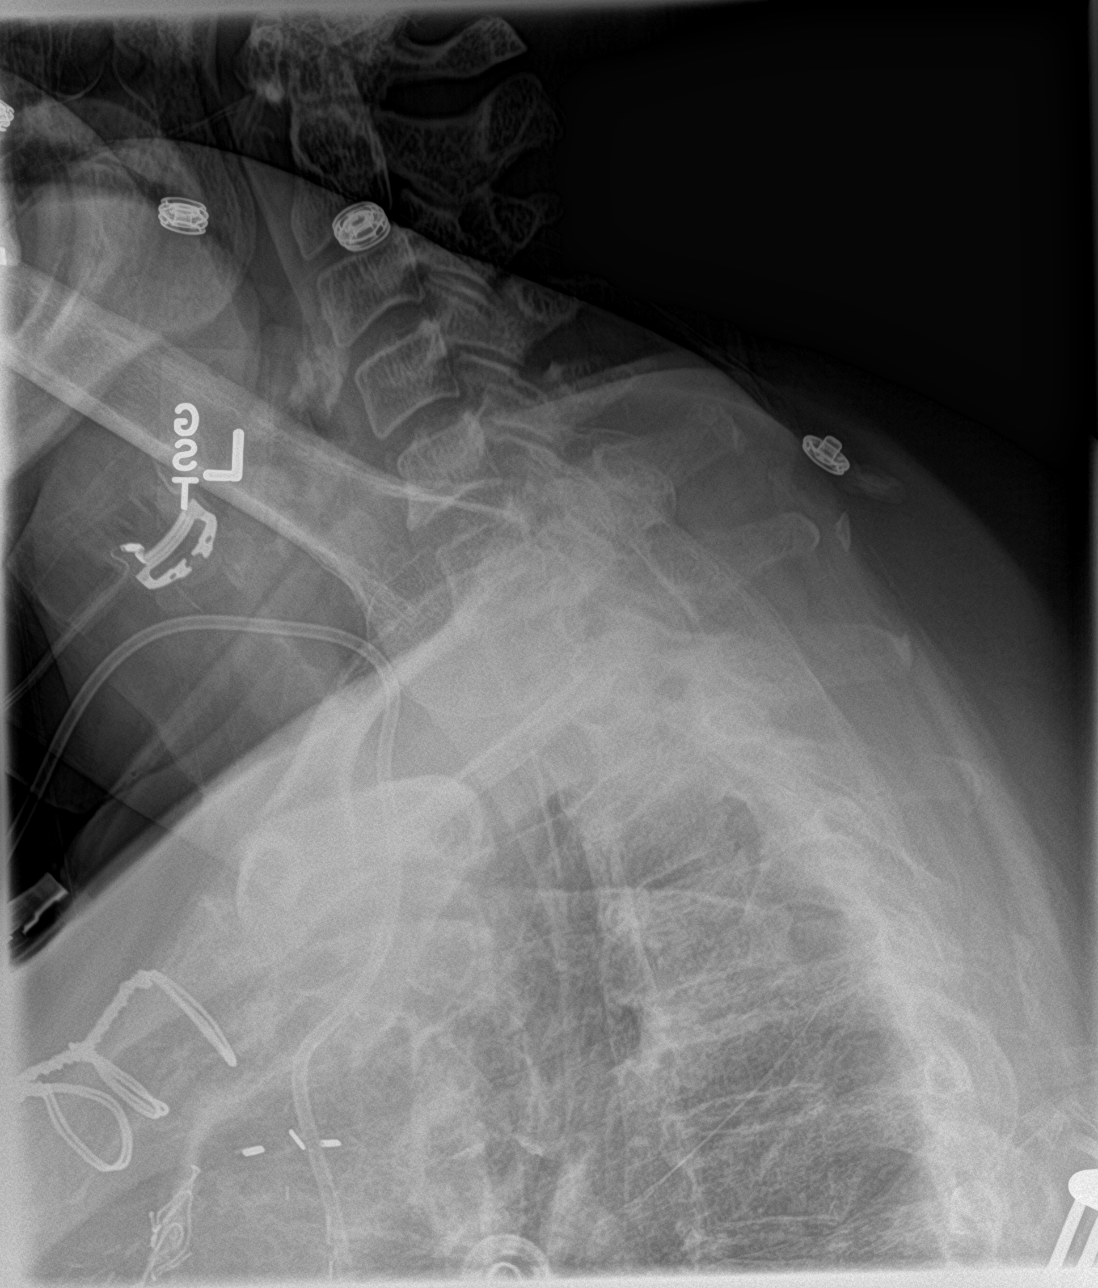

[4 of 4 positions shown; findings below may reference images not displayed]

FINDINGS: Diffuse osteopenia. No acute soft tissue bony abnormality.
Ligamentous ossification noted. Lower cervical spine poorly
visualized. Left subclavian line noted. Prior median sternotomy and
CABG. Bilateral carotid vascular calcification
IMPRESSION: 1. Diffuse osteopenia and degenerative change. Lower cervical spine
difficult visualized. No acute bony abnormality.

2.  Bilateral carotid vascular disease .

## 2018-03-26 ENCOUNTER — Encounter: Payer: Self-pay | Admitting: Medical

## 2018-03-27 ENCOUNTER — Other Ambulatory Visit (HOSPITAL_COMMUNITY): Payer: Self-pay

## 2018-03-28 ENCOUNTER — Ambulatory Visit (HOSPITAL_COMMUNITY)
Admission: RE | Admit: 2018-03-28 | Discharge: 2018-03-28 | Disposition: A | Discharge status: Home / Self Care | Payer: Medicare Other | Source: Ambulatory Visit | Attending: Nephrology | Admitting: Nephrology

## 2018-03-28 DIAGNOSIS — D631 Anemia in chronic kidney disease: Secondary | ICD-10-CM | POA: Insufficient documentation

## 2018-03-28 DIAGNOSIS — N189 Chronic kidney disease, unspecified: Secondary | ICD-10-CM | POA: Diagnosis present

## 2018-03-28 MED ORDER — SODIUM CHLORIDE 0.9 % IV SOLN
510.0000 mg | INTRAVENOUS | Status: DC
  Administered 2018-03-28: 510 mg via INTRAVENOUS
  Filled 2018-03-28: qty 17

## 2018-03-31 LAB — CUP PACEART REMOTE DEVICE CHECK
Date Time Interrogation Session: 20191006164023
Implantable Pulse Generator Implant Date: 20190322

## 2018-04-01 ENCOUNTER — Telehealth: Payer: Self-pay | Admitting: Medical

## 2018-04-01 ENCOUNTER — Encounter: Payer: Self-pay | Admitting: Medical

## 2018-04-01 MED ORDER — ZOLPIDEM TARTRATE 5 MG PO TABS: 5.0000 mg | ORAL_TABLET | Freq: Every evening | ORAL | 1 refills | Status: DC | PRN

## 2018-04-01 NOTE — Telephone Encounter (Signed)
Rx ambien sent to pt pharmacy. 

## 2018-04-01 NOTE — Telephone Encounter (Signed)
Opened to send [place].

## 2018-04-02 ENCOUNTER — Ambulatory Visit: Payer: Medicare Other | Admitting: Nurse Practitioner

## 2018-04-04 ENCOUNTER — Ambulatory Visit (HOSPITAL_COMMUNITY)
Admission: RE | Admit: 2018-04-04 | Discharge: 2018-04-04 | Disposition: A | Discharge status: Home / Self Care | Payer: Medicare Other | Source: Ambulatory Visit | Attending: Nephrology | Admitting: Nephrology

## 2018-04-04 DIAGNOSIS — N189 Chronic kidney disease, unspecified: Secondary | ICD-10-CM | POA: Diagnosis not present

## 2018-04-04 DIAGNOSIS — D631 Anemia in chronic kidney disease: Secondary | ICD-10-CM | POA: Diagnosis present

## 2018-04-04 MED ORDER — SODIUM CHLORIDE 0.9 % IV SOLN
510.0000 mg | INTRAVENOUS | Status: DC
  Administered 2018-04-04: 510 mg via INTRAVENOUS
  Filled 2018-04-04: qty 17

## 2018-04-08 NOTE — Progress Notes (Signed)
   CARDIOLOGY OFFICE NOTE  Date:  04/09/2018    Hannah [name] Date of Birth: 06/22/1945 Medical Record #[id number]  PCP:  Saguier, Edward, PA-C  Cardiologist:  [facility]    Chief Complaint  Patient presents with  . Congestive Heart Failure  . Coronary Artery Disease    Follow up visit - seen for Dr. Taylor    History of Present Illness: Hannah [name] is a 72 y.o. female who presents today for a 6 week check.  She isa former patient of Dr. Tennant's. Seen for Dr. McLean. She now follows with meand Dr. Taylor.   She has a history of CAD s/p CABG, bioprosthetic AVR, CKD,&paroxysmal atrial fibrillation. She had her initial CABG in 1998, then had redo CABG in 2009 with bioprosthetic AVR. She does not remember when she last had documented atrial fibrillation, but she thinks it was a number of years ago, soundedperi-operative around the time of her redo CABG. She was never put on anticoagulation other than Plavixbut had been on dronedaronein the past.   Over the years she has had intermittent issues. These have included tachypalpitations and chest pain. Last cath in 2014 - grafts patent. Myoview in 2015 without ischemia.Event monitor in 8/16 showed atrial tachycardia versus atypical atrial flutter. Toprol XL was increased.She saw Dr Taylor who thought that she had short runs of atrial tachycardia and not atrial flutter. Therefore, she had not been anticoagulated until here recently.   Wasdiagnosed with breast cancerin 2017/2018. She hadsurgery.Gotchemo/radiation - no Adriamycin due to her history of CHF.Her course was complicated by volume overload and anemia.  In January of 2018began to haveorthostasis. She was placed on midodrine and her lasix was changed to as needed only.ACE and beta blocker stopped.Ended up getting on amiodarone in October of 2018 for palpitations/atrial arrhythmias. I saw her in December - she was doing ok - BP started tracking  way back up with the Midodrine and we adjusted her medicines. She had a spell of syncope earlier this year in 2019 and fell - broke her shoulder and sustained a large hematoma. She now has a loop in place.   She was admitted twice in March of 2019 - recurrent syncope - her loop has been ok. EEG without seizure noted.She wasplaced on Florinef. She was taken off her Ultram and Gabapentin. She was not using Lasix.Found to have some PAF this past summer - placed on Eliquis and her aspirin/Plavix were stopped. She has since had the return of palpitations - ended up seeing her with Dr. Taylor and we opted to increase her amiodarone to BID dosing - this helped.   She was in the ER towards the end of the summer - volume overload - treated with IV lasix. I then saw her and she was doing better. Now using just prn Lasix to avoid orthostasis/syncope. We got her echo updated - has grade 2 DD and EF is still ok. Plan was to cut amiodarone back to QD dosing as of October 1.   Comes in today. Here alone - Robert dropped her off. She feels like she is doing well. Has had a couple of iron infusions - this has helped her feel better. No chest pain. Palpitations really not bothersome - nothing like what she had a few months ago - she has cut her amiodarone back per Dr. Taylor on 10/1. So far she feels like she is ok. She is contemplating going back to the gym - we talked about   this yesterday when I saw Robert. Has had recent lab. Overall no real concerns today. BP typically better at home. She has not used an Lasix since her last visit here.    Past Medical History:  Diagnosis Date  . Abnormally small mouth   . Acute on chronic combined systolic and diastolic congestive heart failure (HCC) 07/17/2013  . Adrenal insufficiency (HCC) 07/03/2016  . AKI (acute kidney injury) (HCC) 09/01/2017  . Allergic rhinitis 10/14/2009   Qualifier: Diagnosis of  By: Byrum MD, Robert S   Overview:  Overview:  Qualifier: Diagnosis of   By: Byrum MD, Robert S  Last Assessment & Plan:  Please continue Xyzal and Nasacort as you have been using them  . Anemia   . Asthma 05/12/2009   10/12/2014 p extensive coaching HFA effectiveness =    75% s spacer    Overview:  Overview:  10/12/2014 p extensive coaching HFA effectiveness =    75% s spacer   Last Assessment & Plan:  Please continue Symbicort 2 puffs twice a day. Remember to rinse and gargle after taking this medication. Take albuterol 2 puffs up to every 4 hours if needed for shortness of breath.  Follow with Dr Byrum in 6 month  . Atrial tachycardia (HCC) 04/03/2013   Overview:  Last Assessment & Plan:  I have discussed the likely benign nature of her problem. She has very minimal palpitations. I doubt atrial flutter. I would not anti-coagulate. Her monitor demonstrates that her episodes are short lived. Will follow.   . Bilateral carotid artery stenosis    Bilateral ICA 40-59%/  >50% LECA  . Breast cancer (HCC) 01/18/2016   right breast  . Breast cancer of upper-inner quadrant of right female breast (HCC) 12/16/2015  . CAD (coronary artery disease) cardiologist-  dr mclean   Remote CABG in 1998 with redo in 2009  . Chemotherapy induced nausea and vomiting 02/24/2016  . Chemotherapy-induced peripheral neuropathy (HCC) 04/27/2016  . 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 CHF (congestive heart failure) (HCC)    EF is low normal at 50 to 55% per echo in Jan. 2011  . Chronic coronary artery disease 01/24/2011   Overview:  Last Assessment & Plan:  Stable with no ischemic symptoms.  Continue ASA, statin, ACEI, beta blocker.   . Chronic diastolic CHF (congestive heart failure) (HCC) 09/24/2013  . CKD (chronic kidney disease), stage IV (HCC) 09/24/2013  . Closed fracture of head of left humerus 09/09/2017  . Essential hypertension 05/12/2009   Qualifier: Diagnosis of  By: Byrum MD, Robert S   . GERD (gastroesophageal reflux disease)   . H/O atrial tachycardia 05/12/2009   Qualifier:  History of  By: Byrum MD, Robert S   Overview:  Overview:  Qualifier: History of  By: Byrum MD, Robert S  Last Assessment & Plan:  There was no evidence of this on her cardiac monitor. I would recommend watchful waiting.   . Hearing loss of right ear   . History of aortic valve replacement 10/27/2010   Overview:  Last Assessment & Plan:  Will get echo to reassess bioprosthetic aortic valve.   . History of non-ST elevation myocardial infarction (NSTEMI)    Sept 2014--  thought to be type II HTN w/ LHC without infarct related artery and patent grafts  . History of radiation therapy 05/24/16-07/26/16   right breast 50.4 Gy in 28 fractions, right breast boost 10 Gy in 5 fractions  . Hyperlipidemia   . Hypertension      Resolved.  . Iron deficiency anemia   . Mania (HCC) 03/05/2016  . Moderate persistent asthma    pulmologist-  Dr. Bynum  . Nonischemic cardiomyopathy (HCC)   . Osteomyelitis of toe of left foot (HCC) 04/06/2016  . Osteopenia of multiple sites 10/19/2015  . PAF (paroxysmal atrial fibrillation) (HCC)   . Personal history of chemotherapy 2017  . Personal history of radiation therapy 2017  . Port catheter in place 02/17/2016  . Psoriasis    right leg  . Renal calculus, right   . Renal insufficiency, mild   . S/P AVR    prosthesis valve placement 2009 at same time re-do CABG  . Seizure (HCC) 06/29/2016  . Seizure-like activity (HCC) 09/01/2017  . Sensorineural hearing loss (SNHL) of both ears 01/06/2016  . Stroke (HCC)    residual rt hearing loss  . Syncope 09/06/2017  . Type 2 diabetes mellitus (HCC)    monitored by dr kumar    Past Surgical History:  Procedure Laterality Date  . AORTIC VALVE REPLACEMENT  2009   #21mm Edwards Magna Ease pericardial valve (done same time is CABG)  . BREAST LUMPECTOMY Right 01/18/2016  . BREAST LUMPECTOMY WITH RADIOACTIVE SEED AND SENTINEL LYMPH NODE BIOPSY Right 01/18/2016   Procedure: RIGHT BREAST LUMPECTOMY WITH RADIOACTIVE SEED AND SENTINEL  LYMPH NODE BIOPSY;  Surgeon: David Newman, MD;  Location: [facility] OR;  Service: General;  Laterality: Right;  . CARDIAC CATHETERIZATION  03/23/2008   Pre-redo CABG: L main OK, LAD (T), CFX (T), OM1 99%, RCA (T), LIMA-LAD OK, SVG-OM(?3) OK w/ little florw to OM2, SVG-RCA OK. EF NL  . CARPAL TUNNEL RELEASE    . COLONOSCOPY    . CORONARY ARTERY BYPASS GRAFT  1998 &  re-do 2009   Had LIMA to DX/LAD, SVG to 2 marginal branches and SVG to RC originally; SVG to 3rd OM and PD at time of redo  . CYSTOSCOPY W/ URETERAL STENT PLACEMENT Right 12/20/2014   Procedure: CYSTOSCOPY WITH RETROGRADE PYELOGRAM/URETERAL STENT PLACEMENT;  Surgeon: Patrick L McKenzie, MD;  Location: [facility];  Service: Urology;  Laterality: Right;  . EYE SURGERY Bilateral    cataracts  . HOLMIUM LASER APPLICATION Right 12/20/2014   Procedure:  HOLMIUM LASER LITHOTRIPSY;  Surgeon: Patrick L McKenzie, MD;  Location: [facility];  Service: Urology;  Laterality: Right;  . LEFT HEART CATHETERIZATION WITH CORONARY/GRAFT ANGIOGRAM N/A 02/23/2013   Procedure: LEFT HEART CATHETERIZATION WITH CORONARY/GRAFT ANGIOGRAM;  Surgeon: Michael D Cooper, MD;  Location: [facility] C[facility];  Service: Cardiovascular;  Laterality: N/A;  . LOOP RECORDER INSERTION N/A 08/30/2017   Procedure: LOOP RECORDER INSERTION;  Surgeon: Taylor, Gregg W, MD;  Location: [facility] INVAS[facility];  Service: Cardiovascular;  Laterality: N/A;  . PORTACATH PLACEMENT Left 01/18/2016   Procedure: INSERTION PORT-A-CATH;  Surgeon: David Newman, MD;  Location: [facility] OR;  Service: General;  Laterality: Left;  . portacath removal    . TONSILLECTOMY    . TRANSTHORACIC ECHOCARDIOGRAM  02-24-2013      mild LVH,  ef 50-55%/  AV bioprosthesis was present with very mild stenosis and no regurg., mean grandient 10mmHg, peak grandient 20mmHg /  mild MR/  mild LAE and RAE/  moderate TR  . TUBAL LIGATION       Medications: Current Meds  Medication Sig  . ACCU-CHEK GUIDE test  strip Use to test blood sugar 3 times daily  . ACCU-CHEK GUIDE test strip Use to test blood sugar 3 times daily  .   acetaminophen (TYLENOL) 325 MG tablet Take 2 tablets (650 mg total) by mouth every 6 (six) hours as needed for mild pain (or Fever >/= 101).  . albuterol (PROVENTIL HFA;VENTOLIN HFA) 108 (90 Base) MCG/ACT inhaler Inhale 2 puffs into the lungs every 6 (six) hours as needed for wheezing or shortness of breath.  . amiodarone (PACERONE) 200 MG tablet Take 200 mg by mouth daily.  . apixaban (ELIQUIS) 5 MG TABS tablet Take 1 tablet (5 mg total) by mouth 2 (two) times daily.  . budesonide-formoterol (SYMBICORT) 160-4.5 MCG/ACT inhaler Inhale 2 puffs into the lungs 2 (two) times daily.  . cimetidine (TAGAMET) 200 MG tablet Take 200 mg by mouth at bedtime.  . Dulaglutide (TRULICITY) 1.5 MG/0.5ML SOPN Inject contents of pen once a week (Patient taking differently: Inject 1.5 mg into the skin every Friday. )  . exemestane (AROMASIN) 25 MG tablet TAKE ONE TABLET BY MOUTH DAILY AFTER BREAKFAST  . ezetimibe (ZETIA) 10 MG tablet Take 1 tablet (10 mg total) by mouth daily.  . fludrocortisone (FLORINEF) 0.1 MG tablet Take 1 tablet (0.1 mg total) by mouth daily.  . furosemide (LASIX) 20 MG tablet Take 1 tablet (20 mg total) by mouth daily as needed for fluid.  . Insulin Disposable Pump (V-GO 30) KIT 1 Device by Does not apply route daily. (Patient taking differently: 1 Device by Does not apply route continuous. )  . insulin lispro (HUMALOG KWIKPEN) 100 UNIT/ML KiwkPen 2-6 Units at bedtime as needed (CBG >200).   . insulin lispro (HUMALOG) 100 UNIT/ML injection INJECT 76 UNITS DAILY WITH  V-GO PUMP  . metoprolol tartrate (LOPRESSOR) 25 MG tablet May take a total of 4 tablets in a 24 hour period.  . rosuvastatin (CRESTOR) 40 MG tablet TAKE ONE TABLET BY MOUTH DAILY (Patient taking differently: Take 40 mg by mouth daily. )  . zolpidem (AMBIEN) 5 MG tablet Take 1 tablet (5 mg total) by mouth at bedtime as  needed for sleep.  . [DISCONTINUED] amiodarone (PACERONE) 200 MG tablet Take 1 tablet (200 mg total) by mouth 2 (two) times daily.     Allergies: Allergies  Allergen Reactions  . Amoxicillin Rash and Other (See Comments)    Has patient had a PCN reaction causing immediate rash, facial/tongue/throat swelling, SOB or lightheadedness with hypotension: No Has patient had a PCN reaction causing severe rash involving mucus membranes or skin necrosis: Yes Has patient had a PCN reaction that required hospitalization No Has patient had a PCN reaction occurring within the last 10 years: No If all of the above answers are "NO", then may proceed with Cephalosporin use.   . Imdur [Isosorbide Dinitrate] Other (See Comments)    Headache/severe hypotension/Syncope  . Tape     Must use paper tape  . Arimidex [Anastrozole] Nausea Only  . Latex Itching  . Tetracycline Rash    Social History: The patient  reports that she has never smoked. She has never used smokeless tobacco. She reports that she does not drink alcohol or use drugs.   Family History: The patient's family history includes Diabetes in her maternal grandmother and son; Healthy in her brother; Heart attack in her brother; Heart disease in her brother, father, and maternal grandmother; Heart failure in her father.   Review of Systems: Please see the history of present illness.   Otherwise, the review of systems is positive for none.   All other systems are reviewed and negative.   Physical Exam: VS:  BP (!) 144/60 (BP   Location: Left Arm, Patient Position: Sitting, Cuff Size: Normal)   Pulse 68   Ht 5' 5" (1.651 m)   Wt 192 lb 12.8 oz (87.5 kg)   BMI 32.08 kg/m  .  BMI Body mass index is 32.08 kg/m.  Wt Readings from Last 3 Encounters:  04/09/18 192 lb 12.8 oz (87.5 kg)  03/28/18 194 lb (88 kg)  03/05/18 193 lb 12.8 oz (87.9 kg)    General: Pleasant. Alert and in no acute distress.   HEENT: Normal.  Neck: Supple, no JVD,  carotid bruits, or masses noted.  Cardiac: Regular rate and rhythm. Very soft outflow murmur noted. Valve is crisp. No edema.  Respiratory:  Lungs are clear to auscultation bilaterally with normal work of breathing.  GI: Soft and nontender.  MS: No deformity or atrophy. Gait and ROM intact.  Skin: Warm and dry. Color is normal.  Neuro:  Strength and sensation are intact and no gross focal deficits noted.  Psych: Alert, appropriate and with normal affect.   LABORATORY DATA:  EKG:  EKG is ordered today. This demonstrates NSR with 1st degree AV block.  Lab Results  Component Value Date   WBC 7.4 02/04/2018   HGB 10.0 (L) 02/04/2018   HCT 33.3 (L) 02/04/2018   PLT 131 (L) 02/04/2018   GLUCOSE 245 (H) 02/07/2018   CHOL 109 08/01/2017   TRIG 90.0 08/01/2017   HDL 42.30 08/01/2017   LDLDIRECT 53.0 10/08/2016   LDLCALC 48 08/01/2017   ALT 38 (H) 12/25/2017   AST 41 (H) 12/25/2017   NA 140 02/07/2018   K 3.9 02/07/2018   CL 103 02/07/2018   CREATININE 1.91 (H) 02/07/2018   BUN 26 (H) 02/07/2018   CO2 32 02/07/2018   TSH 1.980 12/25/2017   INR 1.06 06/15/2013   HGBA1C 6.7 (H) 02/07/2018   MICROALBUR 13.3 (H) 02/14/2017     BNP (last 3 results) Recent Labs    09/06/17 1530 02/04/18 0933  BNP 58.0 222.2*    ProBNP (last 3 results) No results for input(s): PROBNP in the last 8760 hours.   Other Studies Reviewed Today:  Echo Study Conclusions 01/2018  - Left ventricle: The cavity size was normal. Systolic function was normal. The estimated ejection fraction was in the range of 60% to 65%. Wall motion was normal; there were no regional wall motion abnormalities. Features are consistent with a pseudonormal left ventricular filling pattern, with concomitant abnormal relaxation and increased filling pressure (grade 2 diastolic dysfunction). Doppler parameters are consistent with high ventricular filling pressure. - Aortic valve: A bioprosthesis was present  and functioning normally. Mean gradient (S): 16 mm Hg. - Mitral valve: Calcified annulus. There was mild regurgitation. - Left atrium: The atrium was moderately dilated. - Right ventricle: The cavity size was mildly dilated. Wall thickness was normal. - Tricuspid valve: There was mild regurgitation. - Pulmonary arteries: Systolic pressure could not be accurately estimated.  EchoStudy ConclusionsDecember 2018  - Left ventricle: The cavity size was normal. Wall thickness was increased in a pattern of severe LVH. Systolic function was normal. The estimated ejection fraction was in the range of 60% to 65%. - Aortic valve: A bioprosthesis was present.   MYOVIEW FINDINGS FROM 06/2013: Normal resting EKG. Slight ST depressions noted in aVL after administration of lexiscan. Mild shortness of breath and nausea resolved after the test. EKG is nondiagnostic for ischemia. TID ratio 1.05. Lung-heart ratio 0.43. Normal ventricular chamber size.  IMPRESSION: No evidence for ischemia. Normal wall motion. LVEF   72%.   Electronically Signed By: Ken Hilty On: 06/15/2013 14:27   Assessment/Plan:  1.Chronic diastolic HF - she is doing very well with salt restriction and daily weights. She has not had to use any prn Lasix over the past 6 weeks.   2. Prior recurrent spells of palpitations - she opted for increased amiodarone over ablation. This seems better. Her dose has been cut back per Dr. Taylor's instructions - she sees him in early January.   3.Unexplained syncope-she has ILR in place. She has had AF noted. She continues to follow with EP  4.History of atrial tach  5.History oforthostasis/syncope- she remains on low dose Florinef -we will continue this for now.  6. CAD- prior CABG - she has no active symptoms. Continue with the current regimen. Exercise is encouraged. Last LHC showed stable anatomy with patent grafts except for SVG-RCA from  older CABG.   7. Anemia -recent CBC noted. Now getting IV iron per nephrology.   8. CKD- followed by nephrology - Dr. Patel  9. Carotid disease- will need repeat study 10/2018 - not discussed today.   Current medicines are reviewed with the patient today.  The patient does not have concerns regarding medicines other than what has been noted above.  The following changes have been made:  See above.  Labs/ tests ordered today include:    Orders Placed This Encounter  Procedures  . EKG 12-Lead     Disposition:   FU with me in 4 months. She sees Dr. Taylor in early January 2020.  Patient is agreeable to this plan and will call if any problems develop in the interim.   Signed: [name] GERHARDT, NP  04/09/2018 2:00 PM   [facility] [address] , [place]  [address] Phone: [phone] Fax: [phone]        

## 2018-04-09 ENCOUNTER — Ambulatory Visit (INDEPENDENT_AMBULATORY_CARE_PROVIDER_SITE_OTHER): Payer: Medicare Other | Admitting: Nurse Practitioner

## 2018-04-09 ENCOUNTER — Encounter: Payer: Self-pay | Admitting: Nurse Practitioner

## 2018-04-09 VITALS — BP 144/60 | HR 68 | Ht 65.0 in | Wt 192.8 lb

## 2018-04-09 DIAGNOSIS — Z79899 Other long term (current) drug therapy: Secondary | ICD-10-CM | POA: Diagnosis not present

## 2018-04-09 DIAGNOSIS — R002 Palpitations: Secondary | ICD-10-CM

## 2018-04-09 DIAGNOSIS — I5032 Chronic diastolic (congestive) heart failure: Secondary | ICD-10-CM | POA: Diagnosis not present

## 2018-04-09 DIAGNOSIS — I2510 Atherosclerotic heart disease of native coronary artery without angina pectoris: Secondary | ICD-10-CM

## 2018-04-09 DIAGNOSIS — Z952 Presence of prosthetic heart valve: Secondary | ICD-10-CM | POA: Diagnosis not present

## 2018-04-09 NOTE — Patient Instructions (Addendum)
We will be checking the following labs today - NONE   Medication Instructions:    Continue with your current medicines.    If you need a refill on your cardiac medications before your next appointment, please call your pharmacy.     Testing/Procedures To Be Arranged:  N/A  Follow-Up:   See Dr. Lovena Le in January  See me towards end of February/early March.      At [facility], you and your health needs are our priority.  As part of our continuing mission to provide you with exceptional heart care, we have created designated Provider Care Teams.  These Care Teams include your primary Cardiologist (physician) and Advanced Practice Providers (APPs -  Physician Assistants and Nurse Practitioners) who all work together to provide you with the care you need, when you need it.  Special Instructions:  . None  Call the [place] office at [phone] if you have any questions, problems or concerns.

## 2018-04-11 ENCOUNTER — Encounter (HOSPITAL_COMMUNITY): Payer: Medicare Other

## 2018-04-14 ENCOUNTER — Other Ambulatory Visit: Payer: Self-pay | Admitting: Endocrinology

## 2018-04-15 ENCOUNTER — Telehealth: Payer: Self-pay | Admitting: Endocrinology

## 2018-04-15 NOTE — Telephone Encounter (Signed)
This was e-scribed and received by the pharmacy at 8:15 am on 04/15/18

## 2018-04-15 NOTE — Telephone Encounter (Signed)
Patient stated that she needed someone to call [facility] to have her prescription sent in  She said that they need to be called because if not it could take up to 6 weeks before she could get this.    TRULICITY 1.5 DX/9.5KG [place], [place]

## 2018-04-18 ENCOUNTER — Ambulatory Visit (INDEPENDENT_AMBULATORY_CARE_PROVIDER_SITE_OTHER): Payer: Medicare Other | Admitting: *Deleted

## 2018-04-18 DIAGNOSIS — R55 Syncope and collapse: Secondary | ICD-10-CM

## 2018-04-20 ENCOUNTER — Encounter: Payer: Self-pay | Admitting: Medical

## 2018-04-20 NOTE — Progress Notes (Signed)
[facility] Summary Report / Loop Recorder 

## 2018-04-22 ENCOUNTER — Other Ambulatory Visit: Payer: Self-pay | Admitting: Medical

## 2018-04-22 NOTE — Telephone Encounter (Signed)
Refill Request: Zolpidem   Last RX:04/01/18 Last OV:03/06/15 Next OV: None scheduled  UDS: 11/20/17 CSC:11/20/17 CSR:

## 2018-04-22 NOTE — Telephone Encounter (Signed)
I wrote pt rx on 04/01/2018. I accidentally wrote 15 tab with one refill. The sig was for 15 days. She has refill on tha script. So she should not need refill until 05-02-2018. Will you verify this with her pharmacy and notify pt. Then wen is acutally due for follow up will presribe 30 tab rx.

## 2018-04-23 ENCOUNTER — Encounter: Payer: Self-pay | Admitting: Medical

## 2018-04-24 ENCOUNTER — Telehealth: Payer: Self-pay | Admitting: Medical

## 2018-04-24 MED ORDER — OMEPRAZOLE 20 MG PO CPDR: 20.0000 mg | DELAYED_RELEASE_CAPSULE | Freq: Every day | ORAL | 3 refills | Status: DC

## 2018-04-24 NOTE — Telephone Encounter (Signed)
Rx omeprazole sent to pt pharmacy.

## 2018-04-25 ENCOUNTER — Ambulatory Visit: Payer: Medicare Other

## 2018-04-29 ENCOUNTER — Ambulatory Visit (INDEPENDENT_AMBULATORY_CARE_PROVIDER_SITE_OTHER): Payer: Medicare Other

## 2018-04-29 DIAGNOSIS — Z23 Encounter for immunization: Secondary | ICD-10-CM | POA: Diagnosis not present

## 2018-05-04 ENCOUNTER — Encounter: Payer: Self-pay | Admitting: Medical

## 2018-05-05 ENCOUNTER — Telehealth: Payer: Self-pay | Admitting: Cardiology

## 2018-05-05 NOTE — Telephone Encounter (Signed)
Transmission reviewed. Presenting rhythm: SR. No episodes recorded since 12/22/17.   Called patient about remote results and sx's. I informed patient that she did not have any episodes recorded on 05/03/18. Patient states that her ILR never records an episode when she has one of these spells. Patient states that she attempted to use her symptom activator. I explained to patient that the activator was unsuccessful. Patient states that she has several activators and will use a different one next time. I encouraged patient to continue to take her Metoprolol PRN and f/u with Dr.Taylor as scheduled. Patient verbalized understanding.

## 2018-05-05 NOTE — Telephone Encounter (Signed)
On Saturday 05-03-2018 around 8:30 - 9:00 PM she felt her heart racing, pounding, tightness in her chest. Pt stated that she did take 2 of her PRN metoprolol and after about 30 mins the symptoms subsided. Instructed pt to send a manual transmission w/ her home monitor. Informed her that a Chief Operating Officer will review and call her back with the results.

## 2018-05-06 ENCOUNTER — Telehealth: Payer: Self-pay | Admitting: Medical

## 2018-05-06 ENCOUNTER — Encounter: Payer: Self-pay | Admitting: Medical

## 2018-05-06 MED ORDER — ZOLPIDEM TARTRATE 5 MG PO TABS: 5.0000 mg | ORAL_TABLET | Freq: Every evening | ORAL | 2 refills | Status: DC | PRN

## 2018-05-06 NOTE — Telephone Encounter (Signed)
Rx ambien sent to pt pharmacy.

## 2018-05-12 ENCOUNTER — Other Ambulatory Visit (INDEPENDENT_AMBULATORY_CARE_PROVIDER_SITE_OTHER): Payer: Medicare Other

## 2018-05-12 DIAGNOSIS — Z794 Long term (current) use of insulin: Secondary | ICD-10-CM

## 2018-05-12 DIAGNOSIS — E1165 Type 2 diabetes mellitus with hyperglycemia: Secondary | ICD-10-CM

## 2018-05-12 LAB — LIPID PANEL
Cholesterol: 110 mg/dL (ref 0–200)
HDL: 33.7 mg/dL — ABNORMAL LOW (ref >39.00)
LDL Cholesterol: 59 mg/dL (ref 0–99)
NonHDL: 75.88
Total CHOL/HDL Ratio: 3
Triglycerides: 82 mg/dL (ref 0.0–149.0)
VLDL: 16.4 mg/dL (ref 0.0–40.0)

## 2018-05-12 LAB — HEMOGLOBIN A1C: Hgb A1c MFr Bld: 6.5 % (ref 4.6–6.5)

## 2018-05-12 LAB — BASIC METABOLIC PANEL
BUN: 26 mg/dL — ABNORMAL HIGH (ref 6–23)
CO2: 30 mEq/L (ref 19–32)
Calcium: 9.2 mg/dL (ref 8.4–10.5)
Chloride: 105 mEq/L (ref 96–112)
Creatinine, Ser: 1.55 mg/dL — ABNORMAL HIGH (ref 0.40–1.20)
GFR: 34.87 mL/min — ABNORMAL LOW (ref >60.00)
Glucose, Bld: 127 mg/dL — ABNORMAL HIGH (ref 70–99)
Potassium: 4 mEq/L (ref 3.5–5.1)
Sodium: 142 mEq/L (ref 135–145)

## 2018-05-12 LAB — MICROALBUMIN / CREATININE URINE RATIO
Creatinine,U: 185.3 mg/dL
Microalb Creat Ratio: 28.8 mg/g (ref 0.0–30.0)
Microalb, Ur: 53.4 mg/dL — ABNORMAL HIGH (ref 0.0–1.9)

## 2018-05-13 ENCOUNTER — Other Ambulatory Visit: Payer: Medicare Other

## 2018-05-14 ENCOUNTER — Ambulatory Visit (INDEPENDENT_AMBULATORY_CARE_PROVIDER_SITE_OTHER): Payer: Medicare Other | Admitting: Medical

## 2018-05-14 ENCOUNTER — Encounter: Payer: Self-pay | Admitting: Medical

## 2018-05-14 ENCOUNTER — Other Ambulatory Visit: Payer: Self-pay

## 2018-05-14 VITALS — BP 142/54 | HR 69 | Temp 98.4°F | Resp 14 | Ht 65.0 in | Wt 187.0 lb

## 2018-05-14 DIAGNOSIS — R1013 Epigastric pain: Secondary | ICD-10-CM

## 2018-05-14 MED ORDER — OMEPRAZOLE 40 MG PO CPDR: 40.0000 mg | DELAYED_RELEASE_CAPSULE | Freq: Every day | ORAL | 3 refills | Status: DC

## 2018-05-14 MED ORDER — SUCRALFATE 1 G PO TABS: 1.0000 g | ORAL_TABLET | Freq: Three times a day (TID) | ORAL | 0 refills | Status: DC

## 2018-05-14 NOTE — Patient Instructions (Signed)
You do appear to have persisting GERD/reflux type symptoms despite use of omeprazole.  Considering possibility of H. pylori bacterial infection and possible gastroparesis.  Gastroparesis concerned due to the fact that you had diabetes for many years.  I want you to take increase omeprazole to 40 mg daily.  Also prescribing Carafate tabs.  Continue to eat healthy bland diet.  We will get a CBC, CMP, amylase, lipase and IgG H. pylori studies.  Since no history of H. pylori infection antibody study feel is adequate.  Omeprazole you could probably affect the accuracy of breath test.  We will see how you do with the above treatment and follow-up on labs.  If lab work-up negative and you do not do not respond to treatment then would refer you upstairs to gastroenterologist.  Follow-up in 10 to 14 days or as needed.

## 2018-05-14 NOTE — Progress Notes (Signed)
Subjective:    Patient ID: Hannah [name], female    DOB: 10-03-1945, 72 y.o.   MRN: [id number]  HPI  Pt in with some easy onset indigestion. She states symptoms occur very easily after eating anything. She will get bloating sensation and will burp constantly after eating. She states symptoms so problematic that she does not want to eat. She has elimated all gerd type provoking foods. Symptoms in epigastric region.  Pt is on prilosec 20 mg but still have symptoms.   Pt also taking tums daily.  She does not report any chest pain or cardiac symptoms.   Pt has been diabetic for 20-30 years. Pt feels like food does not digest. Feels like it sits in stomach.  Pt has never been diagnosed with h pylori.      Review of Systems  Constitutional: Negative for chills, fatigue and fever.  Respiratory: Negative for cough, chest tightness, shortness of breath and wheezing.   Cardiovascular: Negative for chest pain and palpitations.  Gastrointestinal: Positive for abdominal pain. Negative for abdominal distention, blood in stool, constipation, diarrhea, nausea and vomiting.  Genitourinary: Negative for difficulty urinating and dysuria.  Musculoskeletal: Negative for back pain, joint swelling and neck stiffness.  Skin: Negative for rash.  Hematological: Negative for adenopathy. Does not bruise/bleed easily.  Psychiatric/Behavioral: Negative for agitation, behavioral problems and confusion.    Past Medical History:  Diagnosis Date  . Abnormally small mouth   . Acute on chronic combined systolic and diastolic congestive heart failure ([place]) 07/17/2013  . Adrenal insufficiency ([place]) 07/03/2016  . AKI (acute kidney injury) ([place]) 09/01/2017  . Allergic rhinitis 10/14/2009   Qualifier: Diagnosis of  By: Lamonte Sakai MD, Rose Fillers   Overview:  Overview:  Qualifier: Diagnosis of  By: Lamonte Sakai MD, Rose Fillers  Last Assessment & Plan:  Please continue Xyzal and Nasacort as you have been using them  . Anemia   . Asthma  05/12/2009   10/12/2014 p extensive coaching HFA effectiveness =    75% s spacer    Overview:  Overview:  10/12/2014 p extensive coaching HFA effectiveness =    75% s spacer   Last Assessment & Plan:  Please continue Symbicort 2 puffs twice a day. Remember to rinse and gargle after taking this medication. Take albuterol 2 puffs up to every 4 hours if needed for shortness of breath.  Follow with Dr Lamonte Sakai in 6 month  . Atrial tachycardia ([place]) 04/03/2013   Overview:  Last Assessment & Plan:  I have discussed the likely benign nature of her problem. She has very minimal palpitations. I doubt atrial flutter. I would not anti-coagulate. Her monitor demonstrates that her episodes are short lived. Will follow.   . Bilateral carotid artery stenosis    Bilateral ICA 40-59%/  >50% LECA  . Breast cancer ([place]) 01/18/2016   right breast  . Breast cancer of upper-inner quadrant of right female breast ([place]) 12/16/2015  . CAD (coronary artery disease) cardiologist-  dr Aundra Dubin   Remote CABG in 1998 with redo in 2009  . Chemotherapy induced nausea and vomiting 02/24/2016  . Chemotherapy-induced peripheral neuropathy ([place]) 04/27/2016  . 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 CHF (congestive heart failure) (H[facility])    EF is low normal at 50 to 55% per echo in Jan. 2011  . Chronic coronary artery disease 01/24/2011   Overview:  Last Assessment & Plan:  Stable with no ischemic symptoms.  Continue ASA, statin, ACEI, beta blocker.   . Chronic diastolic CHF (  congestive heart failure) ([place]) 09/24/2013  . CKD (chronic kidney disease), stage IV ([place]) 09/24/2013  . Closed fracture of head of left humerus 09/09/2017  . Essential hypertension 05/12/2009   Qualifier: Diagnosis of  By: Lamonte Sakai MD, Rose Fillers   . GERD (gastroesophageal reflux disease)   . H/O atrial tachycardia 05/12/2009   Qualifier: History of  By: Lamonte Sakai MD, Rose Fillers   Overview:  Overview:  Qualifier: History of  By: Lamonte Sakai MD, Rose Fillers  Last Assessment & Plan:   There was no evidence of this on her cardiac monitor. I would recommend watchful waiting.   Marland Kitchen Hearing loss of right ear   . History of aortic valve replacement 10/27/2010   Overview:  Last Assessment & Plan:  Will get echo to reassess bioprosthetic aortic valve.   Marland Kitchen History of non-ST elevation myocardial infarction (NSTEMI)    Sept 2014--  thought to be type II HTN w/ LHC without infarct related artery and patent grafts  . History of radiation therapy 05/24/16-07/26/16   right breast 50.4 Gy in 28 fractions, right breast boost 10 Gy in 5 fractions  . Hyperlipidemia   . Hypertension    Resolved.  . Iron deficiency anemia   . Mania ([place]) 03/05/2016  . Moderate persistent asthma    pulmologist-  Dr. Malvin Johns  . Nonischemic cardiomyopathy ([place])   . Osteomyelitis of toe of left foot ([place]) 04/06/2016  . Osteopenia of multiple sites 10/19/2015  . PAF (paroxysmal atrial fibrillation) ([place])   . Personal history of chemotherapy 2017  . Personal history of radiation therapy 2017  . Port catheter in place 02/17/2016  . Psoriasis    right leg  . Renal calculus, right   . Renal insufficiency, mild   . S/P AVR    prosthesis valve placement 2009 at same time re-do CABG  . Seizure ([place]) 06/29/2016  . Seizure-like activity ([place]) 09/01/2017  . Sensorineural hearing loss (SNHL) of both ears 01/06/2016  . Stroke [facility])    residual rt hearing loss  . Syncope 09/06/2017  . Type 2 diabetes mellitus ([place])    monitored by dr Dwyane Dee     Social History   Socioeconomic History  . Marital status: Married    Spouse name: Not on file  . Number of children: 2  . Years of education: Not on file  . Highest education level: Not on file  Occupational History  . Occupation: retired  Scientific laboratory technician  . Financial resource strain: Not on file  . Food insecurity:    Worry: Not on file    Inability: Not on file  . Transportation needs:    Medical: Not on file    Non-medical: Not on file  Tobacco Use  . Smoking status:  Never Smoker  . Smokeless tobacco: Never Used  Substance and Sexual Activity  . Alcohol use: No  . Drug use: No  . Sexual activity: Yes    Birth control/protection: None  Lifestyle  . Physical activity:    Days per week: Not on file    Minutes per session: Not on file  . Stress: Not on file  Relationships  . Social connections:    Talks on phone: Not on file    Gets together: Not on file    Attends religious service: Not on file    Active member of club or organization: Not on file    Attends meetings of clubs or organizations: Not on file    Relationship status: Not on file  .  Intimate partner violence:    Fear of current or ex partner: Not on file    Emotionally abused: Not on file    Physically abused: Not on file    Forced sexual activity: Not on file  Other Topics Concern  . Not on file  Social History Narrative  . Not on file    Past Surgical History:  Procedure Laterality Date  . AORTIC VALVE REPLACEMENT  2009   [facility]done same time is [facility]  . BREAST LUMPECTOMY Right 01/18/2016  . BREAST LUMPECTOMY WITH RADIOACTIVE SEED AND SENTINEL LYMPH NODE BIOPSY Right 01/18/2016   Procedure: RIGHT BREAST LUMPECTOMY WITH RADIOACTIVE SEED AND SENTINEL LYMPH NODE BIOPSY;  Surgeon: DAlphonsa Overall MD;  [facility] [facility]  Service: General;  Laterality: Right;  . CARDIAC CATHETERIZATION  03/23/2008   Pre-redo CABG: L main OK, LAD (T), CFX (T), OM1 99%, RCA (T), LIMA-LAD OK, SVG-OM(?3) OK w/ little florw to OM2, SVG-RCA OK. EF NL  . CARPAL TUNNEL RELEASE    . COLONOSCOPY    . CORONARY ARTERY BYPASS GRAFT  1998 &  re-do 2009   Had LIMA to DX/LAD, SVG to 2 marginal branches and [facility]; SVG to 3rd OM and PD at time of redo  . CYSTOSCOPY W/ URETERAL STENT PLACEMENT Right 12/20/2014   Procedure: CYSTOSCOPY WITH RETROGRADE PYELOGRAM/URETERAL STENT PLACEMENT;  Surgeon: PCleon Gustin MD;  [facility]  Service:  Urology;  Laterality: Right;  . EYE SURGERY Bilateral    cataracts  . HOLMIUM LASER APPLICATION Right [id number]/72/5366  Procedure:  HOLMIUM LASER LITHOTRIPSY;  Surgeon: PCleon Gustin MD;  [facility]  Service: Urology;  Laterality: Right;  . LEFT HEART CATHETERIZATION WITH CORONARY/GRAFT ANGIOGRAM N/A 02/23/2013   Procedure: LEFT HEART CATHETERIZATION WITH CBeatrix Fetters  Surgeon: MBlane Ohara MD;  [facility];  Service: Cardiovascular;  Laterality: N/A;  . LOOP RECORDER INSERTION N/A 08/30/2017   Procedure: LOOP RECORDER INSERTION;  Surgeon: TEvans Lance MD;  [facility];  Service: Cardiovascular;  Laterality: N/A;  . PORTACATH PLACEMENT Left 01/18/2016   Procedure: INSERTION PORT-A-CATH;  Surgeon: DAlphonsa Overall MD;  [facility] [facility]  Service: General;  Laterality: Left;  . portacath removal    . TONSILLECTOMY    . TRANSTHORACIC ECHOCARDIOGRAM  02-24-2013      mild LVH,  ef 50-55%/  AV bioprosthesis was present with very mild stenosis and no regurg., mean grandient 166mg, peak grandient 2050m /  mild MR/  mild LAE and RAE/  moderate TR  . TUBAL LIGATION      Family History  Problem Relation Age of Onset  . Heart disease Father   . Heart failure Father   . Diabetes Maternal Grandmother   . Heart disease Maternal Grandmother   . Diabetes Son   . Healthy Brother        #1  . Heart attack Brother        #2  . Heart disease Brother        #2    Allergies  Allergen Reactions  . Amoxicillin Rash and Other (See Comments)    Has patient had a PCN reaction causing immediate rash, facial/tongue/throat swelling, SOB or lightheadedness with hypotension: No Has patient had a PCN reaction causing severe rash involving mucus membranes or skin necrosis: Yes Has patient had a PCN reaction that required hospitalization No Has patient had a PCN  reaction occurring within the last 10 years: No If all of the above answers are  "NO", then may proceed with Cephalosporin use.   . Imdur [Isosorbide Dinitrate] Other (See Comments)    Headache/severe hypotension/Syncope  . Tape     Must use paper tape  . Arimidex [Anastrozole] Nausea Only  . Latex Itching  . Tetracycline Rash    Current Outpatient Medications on File Prior to Visit  Medication Sig Dispense Refill  . ACCU-CHEK GUIDE test strip Use to test blood sugar 3 times daily  5  . ACCU-CHEK GUIDE test strip Use to test blood sugar 3 times daily 100 each 3  . acetaminophen (TYLENOL) 325 MG tablet Take 2 tablets (650 mg total) by mouth every 6 (six) hours as needed for mild pain (or Fever >/= 101).    Marland Kitchen albuterol (PROVENTIL HFA;VENTOLIN HFA) 108 (90 Base) MCG/ACT inhaler Inhale 2 puffs into the lungs every 6 (six) hours as needed for wheezing or shortness of breath.    Marland Kitchen amiodarone (PACERONE) 200 MG tablet Take 200 mg by mouth daily.    Marland Kitchen apixaban (ELIQUIS) 5 MG TABS tablet Take 1 tablet (5 mg total) by mouth 2 (two) times daily. 180 tablet 3  . budesonide-formoterol (SYMBICORT) 160-4.5 MCG/ACT inhaler Inhale 2 puffs into the lungs 2 (two) times daily. 10.2 g 2  . exemestane (AROMASIN) 25 MG tablet TAKE ONE TABLET BY MOUTH DAILY AFTER BREAKFAST 90 tablet 3  . ezetimibe (ZETIA) 10 MG tablet Take 1 tablet (10 mg total) by mouth daily. 90 tablet 3  . fludrocortisone (FLORINEF) 0.1 MG tablet Take 1 tablet (0.1 mg total) by mouth daily. 90 tablet 3  . furosemide (LASIX) 20 MG tablet Take 1 tablet (20 mg total) by mouth daily as needed for fluid. 30 tablet 0  . Insulin Disposable Pump (V-GO 30) KIT 1 Device by Does not apply route daily. (Patient taking differently: 1 Device by Does not apply route continuous. ) 90 kit 3  . insulin lispro (HUMALOG KWIKPEN) 100 UNIT/ML KiwkPen 2-6 Units at bedtime as needed (CBG >200).     . insulin lispro (HUMALOG) 100 UNIT/ML injection INJECT 76 UNITS DAILY WITH  V-GO PUMP 70 mL 3  . metoprolol tartrate (LOPRESSOR) 25 MG tablet May take  a total of 4 tablets in a 24 hour period. 30 tablet 3  . omeprazole (PRILOSEC) 20 MG capsule Take 1 capsule (20 mg total) by mouth daily. 30 capsule 3  . rosuvastatin (CRESTOR) 40 MG tablet TAKE ONE TABLET BY MOUTH DAILY (Patient taking differently: Take 40 mg by mouth daily. ) 90 tablet 3  . TRULICITY 1.5 CZ/6.6AY SOPN INJECT THE CONTENTS OF 1  PEN SUBCUTANEOUSLY WEEKLY  AS DIRECTED 6 mL 2  . zolpidem (AMBIEN) 5 MG tablet Take 1 tablet (5 mg total) by mouth at bedtime as needed for sleep. 30 tablet 2   No current facility-administered medications on file prior to visit.     BP (!) 142/54 (BP Location: Left Arm, Patient Position: Sitting, Cuff Size: Large)   Pulse 69   Temp 98.4 F (36.9 C) (Oral)   Resp 14   Ht 5' 5"  (1.651 m)   Wt 187 lb (84.8 kg)   SpO2 97%   BMI 31.12 kg/m       Objective:   Physical Exam  General Appearance- Not in acute distress.  HEENT Eyes- Scleraeral/Conjuntiva-bilat- Not Yellow. Mouth & Throat- Normal.  Chest and Lung Exam Auscultation: Breath sounds:-Normal. Adventitious sounds:- No Adventitious  sounds.  Cardiovascular Auscultation:Rythm - Regular. Heart Sounds -Normal heart sounds.  Abdomen Inspection:-Inspection Normal.  Palpation/Perucssion: Palpation and Percussion of the abdomen reveal- mild-moderate direct mid epigastric Tenderness, No Rebound tenderness, No rigidity(Guarding) and No Palpable abdominal masses.  Liver:-Normal.  Spleen:- Normal.         Assessment & Plan:  You do appear to have persisting GERD/reflux type symptoms despite use of omeprazole.  Considering possibility of H. pylori bacterial infection and possible gastroparesis.  Gastroparesis concerned due to the fact that you had diabetes for many years.  I want you to take increase omeprazole to 40 mg daily.  Also prescribing Carafate tabs.  Continue to eat healthy bland diet.  We will get a CBC, CMP, amylase, lipase and IgG H. pylori studies.  Since no history of H.  pylori infection antibody study feel is adequate.  Omeprazole you could probably affect the accuracy of breath test.  We will see how you do with the above treatment and follow-up on labs.  If lab work-up negative and you do not do not respond to treatment then would refer you upstairs to gastroenterologist.  Follow-up in 10 to 14 days or as needed.  Mackie Pai, PA-C

## 2018-05-15 LAB — CBC WITH DIFFERENTIAL/PLATELET
Basophils Absolute: 0.1 10*3/uL (ref 0.0–0.1)
Basophils Relative: 1 % (ref 0.0–3.0)
Eosinophils Absolute: 0.2 10*3/uL (ref 0.0–0.7)
Eosinophils Relative: 2.7 % (ref 0.0–5.0)
HCT: 33 % — ABNORMAL LOW (ref 36.0–46.0)
Hemoglobin: 11.1 g/dL — ABNORMAL LOW (ref 12.0–15.0)
Lymphocytes Relative: 9.5 % — ABNORMAL LOW (ref 12.0–46.0)
Lymphs Abs: 0.6 10*3/uL — ABNORMAL LOW (ref 0.7–4.0)
MCHC: 33.7 g/dL (ref 30.0–36.0)
MCV: 95.7 fl (ref 78.0–100.0)
Monocytes Absolute: 0.6 10*3/uL (ref 0.1–1.0)
Monocytes Relative: 9 % (ref 3.0–12.0)
Neutro Abs: 5.1 10*3/uL (ref 1.4–7.7)
Neutrophils Relative %: 77.8 % — ABNORMAL HIGH (ref 43.0–77.0)
Platelets: 143 10*3/uL — ABNORMAL LOW (ref 150.0–400.0)
RBC: 3.45 Mil/uL — ABNORMAL LOW (ref 3.87–5.11)
RDW: 14.7 % (ref 11.5–15.5)
WBC: 6.5 10*3/uL (ref 4.0–10.5)

## 2018-05-15 LAB — COMPREHENSIVE METABOLIC PANEL
ALT: 56 U/L — ABNORMAL HIGH (ref 0–35)
AST: 62 U/L — ABNORMAL HIGH (ref 0–37)
Albumin: 3.5 g/dL (ref 3.5–5.2)
Alkaline Phosphatase: 59 U/L (ref 39–117)
BUN: 29 mg/dL — ABNORMAL HIGH (ref 6–23)
CO2: 30 mEq/L (ref 19–32)
Calcium: 9.2 mg/dL (ref 8.4–10.5)
Chloride: 105 mEq/L (ref 96–112)
Creatinine, Ser: 1.65 mg/dL — ABNORMAL HIGH (ref 0.40–1.20)
GFR: 32.44 mL/min — ABNORMAL LOW (ref >60.00)
Glucose, Bld: 125 mg/dL — ABNORMAL HIGH (ref 70–99)
Potassium: 4.4 mEq/L (ref 3.5–5.1)
Sodium: 141 mEq/L (ref 135–145)
Total Bilirubin: 0.9 mg/dL (ref 0.2–1.2)
Total Protein: 6.2 g/dL (ref 6.0–8.3)

## 2018-05-15 LAB — AMYLASE: Amylase: 42 U/L (ref 27–131)

## 2018-05-15 LAB — LIPASE: Lipase: 24 U/L (ref 11.0–59.0)

## 2018-05-15 LAB — H. PYLORI ANTIBODY, IGG: H Pylori IgG: NEGATIVE

## 2018-05-16 ENCOUNTER — Encounter: Payer: Self-pay | Admitting: Medical

## 2018-05-16 ENCOUNTER — Encounter: Payer: Self-pay | Admitting: Endocrinology

## 2018-05-16 ENCOUNTER — Ambulatory Visit (INDEPENDENT_AMBULATORY_CARE_PROVIDER_SITE_OTHER): Payer: Medicare Other | Admitting: Endocrinology

## 2018-05-16 VITALS — BP 130/60 | HR 80 | Ht 65.0 in | Wt 187.0 lb

## 2018-05-16 DIAGNOSIS — E1165 Type 2 diabetes mellitus with hyperglycemia: Secondary | ICD-10-CM | POA: Diagnosis not present

## 2018-05-16 DIAGNOSIS — E782 Mixed hyperlipidemia: Secondary | ICD-10-CM | POA: Diagnosis not present

## 2018-05-16 DIAGNOSIS — Z794 Long term (current) use of insulin: Secondary | ICD-10-CM | POA: Diagnosis not present

## 2018-05-16 NOTE — Progress Notes (Signed)
Patient ID: Hannah [name], female   DOB: Feb 12, 1946, 72 y.o.   MRN: [id number]           Reason for Appointment: Follow-up for Type 2 Diabetes   History of Present Illness:          Date of diagnosis of type 2 diabetes mellitus: Late 1980s        Background history:  She was previously treated with metformin prior to starting insulin She thinks she has been on insulin for at least 10 years and mostly taking Lantus and other types of insulin with it Her metformin was stopped when she started insulin and also has had renal dysfunction Previously her A1c had been consistently over 8% and occasionally higher.  Recent history:   INSULIN regimen is described as:  V-go 30 basal, mealtime coverage 8 u acB, lunch 1-2 clicks and 6-8 acS Non-insulin hypoglycemic drugs the patient is taking are: Trulicity 1.5 mg weekly  She has been on the V-go pump since 03/2015, on the 30 unit pump since about 08/2017  Most recent A1c is 6.5 compared to 6.7   Current diabetes management, blood sugar patterns and problems identified:   She is again having a tendency to HYPOGLYCEMIA sporadically including before breakfast and occasionally overnight  Also has HYPERGLYCEMIA frequently after lunch and dinner  Some of her hyperglycemia related to forgetting to bolus at the time of the meal  She is still averaging 150-170 after her meals  Most of her high readings after lunch are sporadic and highest blood sugar was 379  She is still trying to be fairly active with going to the gym a couple of times a week and recently has lost about 5 pounds  Again she has difficulty adjusting her mealtime boluses based on what she is eating and postprandial readings can fluctuate because of this  She has had at least one episode of HYPOGLYCEMIA overnight from bolusing late at night after blood sugar goes up with missed bolus at dinnertime  She is still checking her sugars about 3-4 times a day on average  She is  changing her V-go pump around the same time daily    Side effects from medications have been: None  Compliance with the medical regimen: Fair  Glucose monitoring:  done about 2 + times a day         Glucometer:  Accu-Chek [facility] Blood Glucose readings by download for the last 4 weeks:   PRE-MEAL Fasting Lunch Dinner Bedtime Overall  Glucose range:  62-146    67-276   Mean/median:  94  128  150  161  136+/-61   POST-MEAL PC Breakfast PC Lunch PC Dinner  Glucose range:     Mean/median:   173    PREVIOUS readings:  PRE-MEAL Fasting Lunch  afternoon Bedtime Overall  Glucose range:  56-139  117  139-198  98-352   Mean/median:  100    183      Self-care: The diet that the patient has been following is: tries to limit fats and carbs.  She uses diet green tea or diet drinks and no sugar in drinks    Lunch 1-2 pm Dinner 6-7 PM  Typical meal intake: Breakfast is oatrmeal or cheese toast              Dietician visit, most recent: 5/16 And in 7/16 with oncology dietitian                Exercise:  At the gym, 2-3/7 days  Weight history:  Wt Readings from Last 3 Encounters:  05/16/18 187 lb (84.8 kg)  05/14/18 187 lb (84.8 kg)  04/09/18 192 lb 12.8 oz (87.5 kg)    Glycemic control:      Lab Results  Component Value Date   HGBA1C 6.5 05/12/2018   HGBA1C 6.7 (H) 02/07/2018   HGBA1C 6.4 (A) 11/08/2017   Lab Results  Component Value Date   MICROALBUR 53.4 (H) 05/12/2018   LDLCALC 59 05/12/2018   CREATININE 1.65 (H) 05/14/2018       Allergies as of 05/16/2018      Reactions   Amoxicillin Rash, Other (See Comments)   Has patient had a PCN reaction causing immediate rash, facial/tongue/throat swelling, SOB or lightheadedness with hypotension: No Has patient had a PCN reaction causing severe rash involving mucus membranes or skin necrosis: Yes Has patient had a PCN reaction that required hospitalization No Has patient had a PCN reaction occurring within the last 10  years: No If all of the above answers are "NO", then may proceed with Cephalosporin use.   Imdur [isosorbide Dinitrate] Other (See Comments)   Headache/severe hypotension/Syncope   Tape    Must use paper tape   Arimidex [anastrozole] Nausea Only   Latex Itching   Tetracycline Rash      Medication List        Accurate as of 05/16/18 10:49 AM. Always use your most recent med list.          ACCU-CHEK GUIDE test strip Generic drug:  glucose blood Use to test blood sugar 3 times daily   ACCU-CHEK GUIDE test strip Generic drug:  glucose blood Use to test blood sugar 3 times daily   acetaminophen 325 MG tablet Commonly known as:  TYLENOL Take 2 tablets (650 mg total) by mouth every 6 (six) hours as needed for mild pain (or Fever >/= 101).   albuterol 108 (90 Base) MCG/ACT inhaler Commonly known as:  PROVENTIL HFA;VENTOLIN HFA Inhale 2 puffs into the lungs every 6 (six) hours as needed for wheezing or shortness of breath.   amiodarone 200 MG tablet Commonly known as:  PACERONE Take 200 mg by mouth daily.   apixaban 5 MG Tabs tablet Commonly known as:  ELIQUIS Take 1 tablet (5 mg total) by mouth 2 (two) times daily.   budesonide-formoterol 160-4.5 MCG/ACT inhaler Commonly known as:  SYMBICORT Inhale 2 puffs into the lungs 2 (two) times daily.   exemestane 25 MG tablet Commonly known as:  AROMASIN TAKE ONE TABLET BY MOUTH DAILY AFTER BREAKFAST   ezetimibe 10 MG tablet Commonly known as:  ZETIA Take 1 tablet (10 mg total) by mouth daily.   fludrocortisone 0.1 MG tablet Commonly known as:  FLORINEF Take 1 tablet (0.1 mg total) by mouth daily.   furosemide 20 MG tablet Commonly known as:  LASIX Take 1 tablet (20 mg total) by mouth daily as needed for fluid.   HUMALOG KWIKPEN 100 UNIT/ML KwikPen Generic drug:  insulin lispro 2-6 Units at bedtime as needed (CBG >200).   insulin lispro 100 UNIT/ML injection Commonly known as:  HUMALOG INJECT 76 UNITS DAILY WITH   V-GO PUMP   metoprolol tartrate 25 MG tablet Commonly known as:  LOPRESSOR May take a total of 4 tablets in a 24 hour period.   omeprazole 40 MG capsule Commonly known as:  PRILOSEC Take 1 capsule (40 mg total) by mouth daily.   rosuvastatin 40 MG tablet Commonly known  as:  CRESTOR TAKE ONE TABLET BY MOUTH DAILY   sucralfate 1 g tablet Commonly known as:  CARAFATE Take 1 tablet (1 g total) by mouth 4 (four) times daily -  with meals and at bedtime.   TRULICITY 1.5 XY/8.0XK Sopn Generic drug:  Dulaglutide INJECT THE CONTENTS OF 1  PEN SUBCUTANEOUSLY WEEKLY  AS DIRECTED   V-GO 30 Kit 1 Device by Does not apply route daily.   zolpidem 5 MG tablet Commonly known as:  AMBIEN Take 1 tablet (5 mg total) by mouth at bedtime as needed for sleep.       Allergies:  Allergies  Allergen Reactions  . Amoxicillin Rash and Other (See Comments)    Has patient had a PCN reaction causing immediate rash, facial/tongue/throat swelling, SOB or lightheadedness with hypotension: No Has patient had a PCN reaction causing severe rash involving mucus membranes or skin necrosis: Yes Has patient had a PCN reaction that required hospitalization No Has patient had a PCN reaction occurring within the last 10 years: No If all of the above answers are "NO", then may proceed with Cephalosporin use.   . Imdur [Isosorbide Dinitrate] Other (See Comments)    Headache/severe hypotension/Syncope  . Tape     Must use paper tape  . Arimidex [Anastrozole] Nausea Only  . Latex Itching  . Tetracycline Rash    Past Medical History:  Diagnosis Date  . Abnormally small mouth   . Acute on chronic combined systolic and diastolic congestive heart failure ([place]) 07/17/2013  . Adrenal insufficiency ([place]) 07/03/2016  . AKI (acute kidney injury) ([place]) 09/01/2017  . Allergic rhinitis 10/14/2009   Qualifier: Diagnosis of  By: Lamonte Sakai MD, Rose Fillers   Overview:  Overview:  Qualifier: Diagnosis of  By: Lamonte Sakai MD, Rose Fillers  Last  Assessment & Plan:  Please continue Xyzal and Nasacort as you have been using them  . Anemia   . Asthma 05/12/2009   10/12/2014 p extensive coaching HFA effectiveness =    75% s spacer    Overview:  Overview:  10/12/2014 p extensive coaching HFA effectiveness =    75% s spacer   Last Assessment & Plan:  Please continue Symbicort 2 puffs twice a day. Remember to rinse and gargle after taking this medication. Take albuterol 2 puffs up to every 4 hours if needed for shortness of breath.  Follow with Dr Lamonte Sakai in 6 month  . Atrial tachycardia ([place]) 04/03/2013   Overview:  Last Assessment & Plan:  I have discussed the likely benign nature of her problem. She has very minimal palpitations. I doubt atrial flutter. I would not anti-coagulate. Her monitor demonstrates that her episodes are short lived. Will follow.   . Bilateral carotid artery stenosis    Bilateral ICA 40-59%/  >50% LECA  . Breast cancer ([place]) 01/18/2016   right breast  . Breast cancer of upper-inner quadrant of right female breast ([place]) 12/16/2015  . CAD (coronary artery disease) cardiologist-  dr Aundra Dubin   Remote CABG in 1998 with redo in 2009  . Chemotherapy induced nausea and vomiting 02/24/2016  . Chemotherapy-induced peripheral neuropathy ([place]) 04/27/2016  . 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 CHF (congestive heart failure) ([facility])    EF is low normal at 50 to 55% per echo in Jan. 2011  . Chronic coronary artery disease 01/24/2011   Overview:  Last Assessment & Plan:  Stable with no ischemic symptoms.  Continue ASA, statin, ACEI, beta blocker.   . Chronic diastolic CHF (congestive heart  failure) ([place]) 09/24/2013  . CKD (chronic kidney disease), stage IV ([place]) 09/24/2013  . Closed fracture of head of left humerus 09/09/2017  . Essential hypertension 05/12/2009   Qualifier: Diagnosis of  By: Lamonte Sakai MD, Rose Fillers   . GERD (gastroesophageal reflux disease)   . H/O atrial tachycardia 05/12/2009   Qualifier: History of  By: Lamonte Sakai MD,  Rose Fillers   Overview:  Overview:  Qualifier: History of  By: Lamonte Sakai MD, Rose Fillers  Last Assessment & Plan:  There was no evidence of this on her cardiac monitor. I would recommend watchful waiting.   Marland Kitchen Hearing loss of right ear   . History of aortic valve replacement 10/27/2010   Overview:  Last Assessment & Plan:  Will get echo to reassess bioprosthetic aortic valve.   Marland Kitchen History of non-ST elevation myocardial infarction (NSTEMI)    Sept 2014--  thought to be type II HTN w/ LHC without infarct related artery and patent grafts  . History of radiation therapy 05/24/16-07/26/16   right breast 50.4 Gy in 28 fractions, right breast boost 10 Gy in 5 fractions  . Hyperlipidemia   . Hypertension    Resolved.  . Iron deficiency anemia   . Mania ([place]) 03/05/2016  . Moderate persistent asthma    pulmologist-  Dr. Malvin Johns  . Nonischemic cardiomyopathy ([place])   . Osteomyelitis of toe of left foot ([place]) 04/06/2016  . Osteopenia of multiple sites 10/19/2015  . PAF (paroxysmal atrial fibrillation) ([place])   . Personal history of chemotherapy 2017  . Personal history of radiation therapy 2017  . Port catheter in place 02/17/2016  . Psoriasis    right leg  . Renal calculus, right   . Renal insufficiency, mild   . S/P AVR    prosthesis valve placement 2009 at same time re-do CABG  . Seizure ([place]) 06/29/2016  . Seizure-like activity ([place]) 09/01/2017  . Sensorineural hearing loss (SNHL) of both ears 01/06/2016  . Stroke [facility])    residual rt hearing loss  . Syncope 09/06/2017  . Type 2 diabetes mellitus ([place])    monitored by dr Dwyane Dee    Past Surgical History:  Procedure Laterality Date  . AORTIC VALVE REPLACEMENT  [facility]   [facility] same [facility] is [facility]  . BREAST LUMPECTOMY Right 01/18/2016  . BREAST LUMPECTOMY WITH RADIOACTIVE SEED AND SENTINEL LYMPH NODE BIOPSY Right 01/18/2016   Procedure: RIGHT BREAST LUMPECTOMY WITH RADIOACTIVE SEED AND SENTINEL LYMPH NODE BIOPSY;  Surgeon:  DAlphonsa Overall MD;  [place]  Service: General;  Laterality: Right;  . CARDIAC CATHETERIZATION  03/23/2008   Pre-redo CABG: L main OK, LAD (T), CFX (T), OM1 99%, RCA (T), LIMA-LAD OK, SVG-OM(?3) OK w/ little florw to OM2, SVG-RCA OK. EF NL  . CARPAL TUNNEL RELEASE    . COLONOSCOPY    . CORONARY ARTERY BYPASS GRAFT  1998 &  re-do 2009   Had LIMA to DX/LAD, [facility] 2 marginal [facility]; SVG to 3rd OM and PD at time of redo  . CYSTOSCOPY W/ URETERAL STENT PLACEMENT Right 12/20/2014   Procedure: CYSTOSCOPY WITH RETROGRADE PYELOGRAM/URETERAL STENT PLACEMENT;  Surgeon: PCleon Gustin MD;  [facility]  Service: Urology;  Laterality: Right;  . EYE SURGERY Bilateral    cataracts  . HOLMIUM LASER APPLICATION Right 70/26/3785  Procedure:  HOLMIUM LASER LITHOTRIPSY;  Surgeon: PCleon Gustin MD;  [facility]  Service:  Urology;  Laterality: Right;  . LEFT HEART CATHETERIZATION WITH CORONARY/GRAFT ANGIOGRAM N/A 02/23/2013   Procedure: LEFT HEART CATHETERIZATION WITH Beatrix Fetters;  Surgeon: Blane Ohara, MD;  [facility];  Service: Cardiovascular;  Laterality: N/A;  . LOOP RECORDER INSERTION N/A 08/30/2017   Procedure: LOOP RECORDER INSERTION;  Surgeon: Evans Lance, MD;  Location: [place] CV LAB;  Service: Cardiovascular;  Laterality: N/A;  . PORTACATH PLACEMENT Left 01/18/2016   Procedure: INSERTION PORT-A-CATH;  Surgeon: Alphonsa Overall, MD;  Location: [place];  Service: General;  Laterality: Left;  . portacath removal    . TONSILLECTOMY    . TRANSTHORACIC ECHOCARDIOGRAM  02-24-2013      mild LVH,  ef 50-55%/  AV bioprosthesis was present with very mild stenosis and no regurg., mean grandient 4mHg, peak grandient 259mg /  mild MR/  mild LAE and RAE/  moderate TR  . TUBAL LIGATION      Family History  Problem Relation Age of Onset  . Heart disease Father   . Heart failure Father   .  Diabetes Maternal Grandmother   . Heart disease Maternal Grandmother   . Diabetes Son   . Healthy Brother        #1  . Heart attack Brother        #2  . Heart disease Brother        #2    Social History:  reports that she has never smoked. She has never used smokeless tobacco. She reports that she does not drink alcohol or use drugs.    Review of Systems    Lipid history: On 40 mg Crestor along with Zetia      Lab Results  Component Value Date   CHOL 110 05/12/2018   HDL 33.70 (L) 05/12/2018   LDLCALC 59 05/12/2018   LDLDIRECT 53.0 10/08/2016   TRIG 82.0 05/12/2018   CHOLHDL 3 05/12/2018           Eyes:  No history of retinopathy  She is followed by nephrologist for renal dysfunction which appears stable   Lab Results  Component Value Date   CREATININE 1.65 (H) 05/14/2018   CREATININE 1.55 (H) 05/12/2018   CREATININE 1.91 (H) 02/07/2018    Diabetic foot exam in 04/2017: She has  loss of monofilament sensation in her distal feet   Physical Examination:  BP 130/60 (BP Location: Left Arm, Patient Position: Sitting, Cuff Size: Normal)   Pulse 80   Ht _0  (1.651 m)   Wt 187 lb (84.8 kg)   SpO2 97%   BMI 31.12 kg/m    ASSESSMENT:  Diabetes type 2, on insulin pump    See history of present illness for detailed discussion of  current management, blood sugar patterns and problems identified  Her A1c is excellent at 6.5  Lowest blood sugars are fasting and averaging below 100 She has occasional low blood sugars waking up Also may have low sugars sporadically before dinner and midday This indicates that she needs a lower basal rate  Postprandial readings are variable depending on her ability to remember her boluses, adjust her mealtime bolus based on what she is eating and variability in her diet and daily routine She is only moderately active but her weight has come down She is likely benefiting from Trulicity also   RENAL dysfunction: Slightly  better, followed by nephrologist  Hypertension: Now appears better controlled Microalbumin normal  Lipids: Well controlled with Crestor and Zetia  PLAN:     We will try to reduce her basal rate and she was given several samples of the V-go 20 unit basal pump  She will try to check sugars more frequently in the next 3 or 4 days and let us know if her blood sugars are still well controlled without hypoglycemia and can switch to the new basal rate  More consistent bolusing before meals including when eating out  She will not bolus after supper if she is late and may take only 1 or 2 clicks for blood sugars over 200  Discussed management of her pump, boluses, estimation of carbohydrates, timing of boluses and changing the pump, blood sugar targets and balancing meals  Continue Trulicity  Counseling time on subjects discussed in assessment and plan sections is over 50% of today's 25 minute visit   There are no Patient Instructions on file for this visit.       Elayne Snare 05/16/2018, 10:49 AM   Note: This office note was prepared with Dragon voice recognition system technology. Any transcriptional errors that result from this process are unintentional.

## 2018-05-16 NOTE — Patient Instructions (Signed)
No more than 2 clicks if sugar hi from forgetting the supper dose

## 2018-05-18 ENCOUNTER — Telehealth: Payer: Self-pay | Admitting: Medical

## 2018-05-18 ENCOUNTER — Encounter: Payer: Self-pay | Admitting: Medical

## 2018-05-18 DIAGNOSIS — R109 Unspecified abdominal pain: Secondary | ICD-10-CM

## 2018-05-18 NOTE — Telephone Encounter (Signed)
Will you call patient and advise I am put order to get [place] in. She will need to fast for at least 6 hours prior to test. Will you coordinate with her getting this study done.  I pain severe/worse and can't wait for test to be scheduled then ED evaluation.

## 2018-05-19 ENCOUNTER — Telehealth: Payer: Self-pay | Admitting: Medical

## 2018-05-19 ENCOUNTER — Ambulatory Visit (HOSPITAL_BASED_OUTPATIENT_CLINIC_OR_DEPARTMENT_OTHER)
Admission: RE | Admit: 2018-05-19 | Discharge: 2018-05-19 | Disposition: A | Discharge status: Home / Self Care | Payer: Medicare Other | Source: Ambulatory Visit | Attending: Medical | Admitting: Medical

## 2018-05-19 DIAGNOSIS — K8020 Calculus of gallbladder without cholecystitis without obstruction: Secondary | ICD-10-CM | POA: Insufficient documentation

## 2018-05-19 DIAGNOSIS — R1013 Epigastric pain: Secondary | ICD-10-CM

## 2018-05-19 DIAGNOSIS — R109 Unspecified abdominal pain: Secondary | ICD-10-CM | POA: Diagnosis not present

## 2018-05-19 NOTE — Telephone Encounter (Signed)
Referal to gi placed. 

## 2018-05-20 ENCOUNTER — Encounter: Payer: Self-pay | Admitting: Gastroenterology

## 2018-05-20 ENCOUNTER — Telehealth: Payer: Self-pay

## 2018-05-20 NOTE — Telephone Encounter (Signed)
Pt. phoned author to review US abdomen imaging results. Author relayed Gillis Santa message on 12/9 and confirmed that GI referral was placed. Pt. verbalized understanding and was instructed to call our office if she had not heard anything from GI by Friday.

## 2018-05-21 ENCOUNTER — Emergency Department (HOSPITAL_COMMUNITY): Payer: Medicare Other

## 2018-05-21 ENCOUNTER — Ambulatory Visit (INDEPENDENT_AMBULATORY_CARE_PROVIDER_SITE_OTHER): Payer: Medicare Other

## 2018-05-21 ENCOUNTER — Other Ambulatory Visit: Payer: Self-pay

## 2018-05-21 ENCOUNTER — Encounter (HOSPITAL_COMMUNITY): Payer: Self-pay | Admitting: Emergency Medicine

## 2018-05-21 ENCOUNTER — Telehealth: Payer: Self-pay | Admitting: Endocrinology

## 2018-05-21 ENCOUNTER — Observation Stay (HOSPITAL_COMMUNITY)
Admission: EM | Admit: 2018-05-21 | Discharge: 2018-05-22 | Disposition: A | Discharge status: Home / Self Care | Payer: Medicare Other | Attending: Internal Medicine | Admitting: Internal Medicine

## 2018-05-21 DIAGNOSIS — R531 Weakness: Secondary | ICD-10-CM | POA: Diagnosis not present

## 2018-05-21 DIAGNOSIS — I2510 Atherosclerotic heart disease of native coronary artery without angina pectoris: Secondary | ICD-10-CM | POA: Diagnosis not present

## 2018-05-21 DIAGNOSIS — Z7901 Long term (current) use of anticoagulants: Secondary | ICD-10-CM | POA: Diagnosis not present

## 2018-05-21 DIAGNOSIS — I700 Atherosclerosis of aorta: Secondary | ICD-10-CM | POA: Diagnosis not present

## 2018-05-21 DIAGNOSIS — Z8249 Family history of ischemic heart disease and other diseases of the circulatory system: Secondary | ICD-10-CM | POA: Insufficient documentation

## 2018-05-21 DIAGNOSIS — Z79899 Other long term (current) drug therapy: Secondary | ICD-10-CM | POA: Diagnosis not present

## 2018-05-21 DIAGNOSIS — N179 Acute kidney failure, unspecified: Secondary | ICD-10-CM | POA: Insufficient documentation

## 2018-05-21 DIAGNOSIS — E1122 Type 2 diabetes mellitus with diabetic chronic kidney disease: Secondary | ICD-10-CM | POA: Diagnosis not present

## 2018-05-21 DIAGNOSIS — K219 Gastro-esophageal reflux disease without esophagitis: Secondary | ICD-10-CM | POA: Insufficient documentation

## 2018-05-21 DIAGNOSIS — I130 Hypertensive heart and chronic kidney disease with heart failure and stage 1 through stage 4 chronic kidney disease, or unspecified chronic kidney disease: Secondary | ICD-10-CM | POA: Diagnosis not present

## 2018-05-21 DIAGNOSIS — I252 Old myocardial infarction: Secondary | ICD-10-CM | POA: Insufficient documentation

## 2018-05-21 DIAGNOSIS — K828 Other specified diseases of gallbladder: Secondary | ICD-10-CM | POA: Insufficient documentation

## 2018-05-21 DIAGNOSIS — I480 Paroxysmal atrial fibrillation: Secondary | ICD-10-CM | POA: Diagnosis not present

## 2018-05-21 DIAGNOSIS — I6523 Occlusion and stenosis of bilateral carotid arteries: Secondary | ICD-10-CM | POA: Insufficient documentation

## 2018-05-21 DIAGNOSIS — N184 Chronic kidney disease, stage 4 (severe): Secondary | ICD-10-CM | POA: Insufficient documentation

## 2018-05-21 DIAGNOSIS — E2740 Unspecified adrenocortical insufficiency: Secondary | ICD-10-CM | POA: Diagnosis not present

## 2018-05-21 DIAGNOSIS — J4540 Moderate persistent asthma, uncomplicated: Secondary | ICD-10-CM | POA: Insufficient documentation

## 2018-05-21 DIAGNOSIS — E785 Hyperlipidemia, unspecified: Secondary | ICD-10-CM | POA: Diagnosis not present

## 2018-05-21 DIAGNOSIS — J45909 Unspecified asthma, uncomplicated: Secondary | ICD-10-CM | POA: Diagnosis present

## 2018-05-21 DIAGNOSIS — I5032 Chronic diastolic (congestive) heart failure: Secondary | ICD-10-CM | POA: Diagnosis not present

## 2018-05-21 DIAGNOSIS — R55 Syncope and collapse: Secondary | ICD-10-CM

## 2018-05-21 DIAGNOSIS — Z853 Personal history of malignant neoplasm of breast: Secondary | ICD-10-CM | POA: Insufficient documentation

## 2018-05-21 DIAGNOSIS — K8020 Calculus of gallbladder without cholecystitis without obstruction: Secondary | ICD-10-CM | POA: Diagnosis not present

## 2018-05-21 DIAGNOSIS — R9431 Abnormal electrocardiogram [ECG] [EKG]: Secondary | ICD-10-CM | POA: Diagnosis present

## 2018-05-21 DIAGNOSIS — Z953 Presence of xenogenic heart valve: Secondary | ICD-10-CM | POA: Insufficient documentation

## 2018-05-21 DIAGNOSIS — N6489 Other specified disorders of breast: Secondary | ICD-10-CM | POA: Insufficient documentation

## 2018-05-21 DIAGNOSIS — Z951 Presence of aortocoronary bypass graft: Secondary | ICD-10-CM | POA: Diagnosis not present

## 2018-05-21 DIAGNOSIS — N6001 Solitary cyst of right breast: Secondary | ICD-10-CM | POA: Insufficient documentation

## 2018-05-21 DIAGNOSIS — Z794 Long term (current) use of insulin: Secondary | ICD-10-CM | POA: Insufficient documentation

## 2018-05-21 DIAGNOSIS — I10 Essential (primary) hypertension: Secondary | ICD-10-CM | POA: Diagnosis present

## 2018-05-21 DIAGNOSIS — Z9221 Personal history of antineoplastic chemotherapy: Secondary | ICD-10-CM | POA: Diagnosis not present

## 2018-05-21 DIAGNOSIS — E119 Type 2 diabetes mellitus without complications: Secondary | ICD-10-CM

## 2018-05-21 DIAGNOSIS — Z7982 Long term (current) use of aspirin: Secondary | ICD-10-CM | POA: Insufficient documentation

## 2018-05-21 DIAGNOSIS — I4891 Unspecified atrial fibrillation: Secondary | ICD-10-CM | POA: Diagnosis present

## 2018-05-21 DIAGNOSIS — Z87442 Personal history of urinary calculi: Secondary | ICD-10-CM | POA: Insufficient documentation

## 2018-05-21 DIAGNOSIS — Z8673 Personal history of transient ischemic attack (TIA), and cerebral infarction without residual deficits: Secondary | ICD-10-CM | POA: Insufficient documentation

## 2018-05-21 HISTORY — DX: Syncope and collapse: R55

## 2018-05-21 LAB — CBC
HCT: 36.3 % (ref 36.0–46.0)
Hemoglobin: 10.8 g/dL — ABNORMAL LOW (ref 12.0–15.0)
MCH: 29.6 pg (ref 26.0–34.0)
MCHC: 29.8 g/dL — ABNORMAL LOW (ref 30.0–36.0)
MCV: 99.5 fL (ref 80.0–100.0)
Platelets: 145 10*3/uL — ABNORMAL LOW (ref 150–400)
RBC: 3.65 MIL/uL — ABNORMAL LOW (ref 3.87–5.11)
RDW: 13.2 % (ref 11.5–15.5)
WBC: 8.2 10*3/uL (ref 4.0–10.5)
nRBC: 0 % (ref 0.0–0.2)

## 2018-05-21 LAB — URINALYSIS, ROUTINE W REFLEX MICROSCOPIC
Bacteria, UA: NONE SEEN
Bilirubin Urine: NEGATIVE
Glucose, UA: 50 mg/dL — AB
Ketones, ur: NEGATIVE mg/dL
Nitrite: NEGATIVE
Protein, ur: 30 mg/dL — AB
Specific Gravity, Urine: 1.023 (ref 1.005–1.030)
WBC, UA: >50 WBC/hpf — ABNORMAL HIGH (ref 0–5)
pH: 7 (ref 5.0–8.0)

## 2018-05-21 LAB — HEPATIC FUNCTION PANEL
ALT: 49 U/L — ABNORMAL HIGH (ref 0–44)
AST: 55 U/L — ABNORMAL HIGH (ref 15–41)
Albumin: 3.1 g/dL — ABNORMAL LOW (ref 3.5–5.0)
Alkaline Phosphatase: 59 U/L (ref 38–126)
Bilirubin, Direct: 0.3 mg/dL — ABNORMAL HIGH (ref 0.0–0.2)
Indirect Bilirubin: 0.9 mg/dL (ref 0.3–0.9)
Total Bilirubin: 1.2 mg/dL (ref 0.3–1.2)
Total Protein: 6.4 g/dL — ABNORMAL LOW (ref 6.5–8.1)

## 2018-05-21 LAB — BASIC METABOLIC PANEL
Anion gap: 13 (ref 5–15)
BUN: 29 mg/dL — ABNORMAL HIGH (ref 8–23)
CO2: 23 mmol/L (ref 22–32)
Calcium: 9.3 mg/dL (ref 8.9–10.3)
Chloride: 106 mmol/L (ref 98–111)
Creatinine, Ser: 2.37 mg/dL — ABNORMAL HIGH (ref 0.44–1.00)
GFR calc Af Amer: 23 mL/min — ABNORMAL LOW (ref >60)
GFR calc non Af Amer: 20 mL/min — ABNORMAL LOW (ref >60)
Glucose, Bld: 143 mg/dL — ABNORMAL HIGH (ref 70–99)
Potassium: 3.5 mmol/L (ref 3.5–5.1)
Sodium: 142 mmol/L (ref 135–145)

## 2018-05-21 LAB — PROTIME-INR
INR: 1.91
Prothrombin Time: 21.7 seconds — ABNORMAL HIGH (ref 11.4–15.2)

## 2018-05-21 LAB — I-STAT TROPONIN, ED: Troponin i, poc: 0 ng/mL (ref 0.00–0.08)

## 2018-05-21 LAB — CBG MONITORING, ED: Glucose-Capillary: 92 mg/dL (ref 70–99)

## 2018-05-21 LAB — LIPASE, BLOOD: Lipase: 22 U/L (ref 11–51)

## 2018-05-21 LAB — MAGNESIUM: Magnesium: 2 mg/dL (ref 1.7–2.4)

## 2018-05-21 MED ORDER — AMIODARONE HCL 200 MG PO TABS
200.0000 mg | ORAL_TABLET | Freq: Every day | ORAL | Status: DC
  Administered 2018-05-22: 200 mg via ORAL
  Filled 2018-05-21: qty 1

## 2018-05-21 MED ORDER — IOPAMIDOL (ISOVUE-370) INJECTION 76%
INTRAVENOUS | Status: AC
  Administered 2018-05-21: 100 mL
  Filled 2018-05-21: qty 100

## 2018-05-21 MED ORDER — EZETIMIBE 10 MG PO TABS
10.0000 mg | ORAL_TABLET | Freq: Every day | ORAL | Status: DC
  Administered 2018-05-22: 10 mg via ORAL
  Filled 2018-05-21: qty 1

## 2018-05-21 MED ORDER — APIXABAN 5 MG PO TABS
5.0000 mg | ORAL_TABLET | Freq: Two times a day (BID) | ORAL | Status: DC
  Administered 2018-05-21 – 2018-05-22 (×2): 5 mg via ORAL
  Filled 2018-05-21 (×2): qty 1

## 2018-05-21 MED ORDER — ACETAMINOPHEN 325 MG PO TABS: 650.0000 mg | ORAL_TABLET | Freq: Four times a day (QID) | ORAL | Status: DC | PRN

## 2018-05-21 MED ORDER — V-GO 20 KIT: 1.0000 | PACK | Freq: Every day | 3 refills | Status: DC

## 2018-05-21 MED ORDER — ROSUVASTATIN CALCIUM 20 MG PO TABS
40.0000 mg | ORAL_TABLET | Freq: Every day | ORAL | Status: DC
  Administered 2018-05-21: 40 mg via ORAL
  Filled 2018-05-21: qty 4

## 2018-05-21 MED ORDER — ACETAMINOPHEN 650 MG RE SUPP: 650.0000 mg | Freq: Four times a day (QID) | RECTAL | Status: DC | PRN

## 2018-05-21 MED ORDER — ALBUTEROL SULFATE (2.5 MG/3ML) 0.083% IN NEBU: 3.0000 mL | INHALATION_SOLUTION | Freq: Four times a day (QID) | RESPIRATORY_TRACT | Status: DC | PRN

## 2018-05-21 MED ORDER — FUROSEMIDE 20 MG PO TABS: 20.0000 mg | ORAL_TABLET | Freq: Every day | ORAL | Status: DC | PRN

## 2018-05-21 MED ORDER — FLUDROCORTISONE ACETATE 0.1 MG PO TABS
0.1000 mg | ORAL_TABLET | Freq: Every day | ORAL | Status: DC
  Administered 2018-05-22: 0.1 mg via ORAL
  Filled 2018-05-21: qty 1

## 2018-05-21 MED ORDER — INSULIN ASPART 100 UNIT/ML ~~LOC~~ SOLN: 0.0000 [IU] | Freq: Every day | SUBCUTANEOUS | Status: DC

## 2018-05-21 MED ORDER — MOMETASONE FURO-FORMOTEROL FUM 200-5 MCG/ACT IN AERO
2.0000 | INHALATION_SPRAY | Freq: Two times a day (BID) | RESPIRATORY_TRACT | Status: DC
  Administered 2018-05-22: 2 via RESPIRATORY_TRACT
  Filled 2018-05-21: qty 8.8

## 2018-05-21 MED ORDER — EXEMESTANE 25 MG PO TABS
25.0000 mg | ORAL_TABLET | Freq: Every day | ORAL | Status: DC
  Administered 2018-05-22: 25 mg via ORAL
  Filled 2018-05-21: qty 1

## 2018-05-21 MED ORDER — SODIUM CHLORIDE 0.9 % IV BOLUS
500.0000 mL | Freq: Once | INTRAVENOUS | Status: AC
  Administered 2018-05-21: 500 mL via INTRAVENOUS

## 2018-05-21 MED ORDER — SODIUM CHLORIDE 0.9% FLUSH
3.0000 mL | Freq: Two times a day (BID) | INTRAVENOUS | Status: DC
  Administered 2018-05-21 – 2018-05-22 (×2): 3 mL via INTRAVENOUS

## 2018-05-21 MED ORDER — INSULIN ASPART 100 UNIT/ML ~~LOC~~ SOLN: 0.0000 [IU] | Freq: Three times a day (TID) | SUBCUTANEOUS | Status: DC

## 2018-05-21 NOTE — H&P (Addendum)
History and Physical    [name] MWN:[id number] DOB: 1945-12-21 DOA: 05/21/2018  PCP: Mackie Pai, PA-C  Patient coming from: Transported by EMS from [facility]  I have personally briefly reviewed patient's old medical records in [place]  Chief Complaint: Syncope  HPI: Hannah [name] is a 72 y.o. female with medical history significant for chronic diastolic CHF, CAD status post CABG, status post bioprosthetic AVR, atrial fibrillation on Eliquis, insulin-dependent type 2 diabetes, CKD stage IV, carotid artery stenosis, and history of recurrent orthostasis/syncope who presents to the ED with an episode of syncope/near syncope.    Patient says that she was getting groceries at [facility] when she had sudden onset pain in the back of her neck and head.  She had associated weakness, lightheadedness, and lethargy.  She went to sit down and afterwards does not recall what happened until she woke up in the ambulance.  On EMS arrival apparently she was unresponsive and extremely lethargic.  She was also reportedly diaphoretic.  She has a history of syncope in the past for which she has been on Florinef for the last 9 months and has been doing well since then.  She says the headache was new with this episode.    Family says that she was confused and poorly responsive for a prolonged period of time compared to her prior episodes.  The patient is now alert and oriented on my examination and denies any associated chest pain, dyspnea, nausea, vomiting, or dysuria.  She reports chronic palpitations from her atrial fibrillation for which she is on amiodarone and as needed metoprolol.  She says her amiodarone was decreased from twice a day to once a day and she has required the use of her metoprolol more frequently than prior.  She has an ILR in place.  ED Course:  Initial vitals in the ED showed BP 135/50, pulse 58, RR 21, temp 98.47F, SPO2 100% on room air.  Labs were notable for creatinine 2.37  compared to 1.65 on 05/14/2018, magnesium 2.0, i-STAT troponin negative, serum glucose 143.  Chest x-ray showed prior sternotomy without infiltrate, consolidation, or effusion.  CTA head and neck were without intracranial hemorrhage, emergent large vessel occlusion or high-grade stenosis, 60% stenosis of the Rt ICA was noted. CTA chest was without evidence of pulmonary embolism. She was given 500 mL bolus and the hospitalist service was consulted to admit for syncope.  Review of Systems: As per HPI otherwise 10 point review of systems negative.    Past Medical History:  Diagnosis Date  . Abnormally small mouth   . Acute on chronic combined systolic and diastolic congestive heart failure ([place]) 07/17/2013  . Adrenal insufficiency ([place]) 07/03/2016  . AKI (acute kidney injury) ([place]) 09/01/2017  . Allergic rhinitis 10/14/2009   Qualifier: Diagnosis of  By: Lamonte Sakai MD, Rose Fillers   Overview:  Overview:  Qualifier: Diagnosis of  By: Lamonte Sakai MD, Rose Fillers  Last Assessment & Plan:  Please continue Xyzal and Nasacort as you have been using them  . Anemia   . Asthma 05/12/2009   10/12/2014 p extensive coaching HFA effectiveness =    75% s spacer    Overview:  Overview:  10/12/2014 p extensive coaching HFA effectiveness =    75% s spacer   Last Assessment & Plan:  Please continue Symbicort 2 puffs twice a day. Remember to rinse and gargle after taking this medication. Take albuterol 2 puffs up to every 4 hours if needed for shortness of  breath.  Follow with Dr Lamonte Sakai in 6 month  . Atrial tachycardia ([place]) 04/03/2013   Overview:  Last Assessment & Plan:  I have discussed the likely benign nature of her problem. She has very minimal palpitations. I doubt atrial flutter. I would not anti-coagulate. Her monitor demonstrates that her episodes are short lived. Will follow.   . Bilateral carotid artery stenosis    Bilateral ICA 40-59%/  >50% LECA  . Breast cancer ([place]) 01/18/2016   right breast  . Breast cancer of upper-inner  quadrant of right female breast ([place]) 12/16/2015  . CAD (coronary artery disease) cardiologist-  dr Aundra Dubin   Remote CABG in 1998 with redo in 2009  . Chemotherapy induced nausea and vomiting 02/24/2016  . Chemotherapy-induced peripheral neuropathy ([place]) 04/27/2016  . 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 CHF (congestive heart failure) (HCC)    EF is low normal at 50 to 55% per echo in Jan. 2011  . Chronic coronary artery disease 01/24/2011   Overview:  Last Assessment & Plan:  Stable with no ischemic symptoms.  Continue ASA, statin, ACEI, beta blocker.   . Chronic diastolic CHF (congestive heart failure) ([place]) 09/24/2013  . CKD (chronic kidney disease), stage IV ([place]) 09/24/2013  . Closed fracture of head of left humerus 09/09/2017  . Essential hypertension 05/12/2009   Qualifier: Diagnosis of  By: Lamonte Sakai MD, Rose Fillers   . GERD (gastroesophageal reflux disease)   . H/O atrial tachycardia 05/12/2009   Qualifier: History of  By: Lamonte Sakai MD, Rose Fillers   Overview:  Overview:  Qualifier: History of  By: Lamonte Sakai MD, Rose Fillers  Last Assessment & Plan:  There was no evidence of this on her cardiac monitor. I would recommend watchful waiting.   Marland Kitchen Hearing loss of right ear   . History of aortic valve replacement 10/27/2010   Overview:  Last Assessment & Plan:  Will get echo to reassess bioprosthetic aortic valve.   Marland Kitchen History of non-ST elevation myocardial infarction (NSTEMI)    Sept 2014--  thought to be type II HTN w/ LHC without infarct related artery and patent grafts  . History of radiation therapy 05/24/16-07/26/16   right breast 50.4 Gy in 28 fractions, right breast boost 10 Gy in 5 fractions  . Hyperlipidemia   . Hypertension    Resolved.  . Iron deficiency anemia   . Mania ([place]) 03/05/2016  . Moderate persistent asthma    pulmologist-  Dr. Malvin Johns  . Nonischemic cardiomyopathy ([place])   . Osteomyelitis of toe of left foot ([place]) 04/06/2016  . Osteopenia of multiple sites 10/19/2015  . PAF  (paroxysmal atrial fibrillation) ([place])   . Personal history of chemotherapy 2017  . Personal history of radiation therapy 2017  . Port catheter in place 02/17/2016  . Psoriasis    right leg  . Renal calculus, right   . Renal insufficiency, mild   . S/P AVR    prosthesis valve placement 2009 at same time re-do CABG  . Seizure ([place]) 06/29/2016  . Seizure-like activity ([place]) 09/01/2017  . Sensorineural hearing loss (SNHL) of both ears 01/06/2016  . Stroke [facility])    residual rt hearing loss  . Syncope 09/06/2017  . Type 2 diabetes mellitus ([place])    monitored by dr Dwyane Dee    Past Surgical History:  Procedure Laterality Date  . AORTIC VALVE REPLACEMENT  2009   [facility] (done same time is CABG)  . BREAST LUMPECTOMY Right 01/18/2016  . BREAST LUMPECTOMY  WITH RADIOACTIVE SEED AND SENTINEL LYMPH NODE BIOPSY Right 01/18/2016   Procedure: RIGHT BREAST LUMPECTOMY WITH RADIOACTIVE SEED AND SENTINEL LYMPH NODE BIOPSY;  Surgeon: Alphonsa Overall, MD;  Location: [place];  Service: General;  Laterality: Right;  . CARDIAC CATHETERIZATION  03/23/2008   Pre-redo CABG: L main OK, LAD (T), CFX (T), OM1 99%, RCA (T), LIMA-LAD OK, SVG-OM(?3) OK w/ little florw to OM2, SVG-RCA OK. EF NL  . CARPAL TUNNEL RELEASE    . COLONOSCOPY    . CORONARY ARTERY BYPASS GRAFT  1998 &  re-do 2009   Had LIMA to DX/LAD, SVG to 2 marginal branches and SVG to [facility] originally; SVG to 3rd OM and PD at time of redo  . CYSTOSCOPY W/ URETERAL STENT PLACEMENT Right 12/20/2014   Procedure: CYSTOSCOPY WITH RETROGRADE PYELOGRAM/URETERAL STENT PLACEMENT;  Surgeon: Cleon Gustin, MD;  Location: [facility];  Service: Urology;  Laterality: Right;  . EYE SURGERY Bilateral    cataracts  . HOLMIUM LASER APPLICATION Right 11/28/3557   Procedure:  HOLMIUM LASER LITHOTRIPSY;  Surgeon: Cleon Gustin, MD;  Location[facility];  Service: Urology;  Laterality: Right;  . LEFT HEART  CATHETERIZATION WITH CORONARY/GRAFT ANGIOGRAM N/A 02/23/2013   Procedure: LEFT HEART CATHETERIZATION WITH Beatrix Fetters;  Surgeon: Blane Ohara, MD;  [facility];  Service: Cardiovascular;  Laterality: N/A;  . LOOP RECORDER INSERTION N/A 08/30/2017   Procedure: LOOP RECORDER INSERTION;  Surgeon: Evans Lance, MD;  Location: [place] CV LAB;  Service: Cardiovascular;  Laterality: N/A;  . PORTACATH PLACEMENT Left 01/18/2016   Procedure: INSERTION PORT-A-CATH;  Surgeon: Alphonsa Overall, MD;  Location: [place];  Service: General;  Laterality: Left;  . portacath removal    . TONSILLECTOMY    . TRANSTHORACIC ECHOCARDIOGRAM  02-24-2013      mild LVH,  ef 50-55%/  AV bioprosthesis was present with very mild stenosis and no regurg., mean grandient 22mHg, peak grandient 266mg /  mild MR/  mild LAE and RAE/  moderate TR  . TUBAL LIGATION       reports that she has never smoked. She has never used smokeless tobacco. She reports that she does not drink alcohol or use drugs.  Allergies  Allergen Reactions  . Amoxicillin Rash and Other (See Comments)    Has patient had a PCN reaction causing immediate rash, facial/tongue/throat swelling, SOB or lightheadedness with hypotension: No Has patient had a PCN reaction causing severe rash involving mucus membranes or skin necrosis: Yes Has patient had a PCN reaction that required hospitalization No Has patient had a PCN reaction occurring within the last 10 years: No If all of the above answers are "NO", then may proceed with Cephalosporin use.   . Imdur [Isosorbide Dinitrate] Other (See Comments)    Headache/severe hypotension/Syncope  . Tape     Must use paper tape  . Arimidex [Anastrozole] Nausea Only  . Latex Itching  . Tetracycline Rash    Family History  Problem Relation Age of Onset  . Heart disease Father   . Heart failure Father   . Diabetes Maternal Grandmother   . Heart disease Maternal Grandmother   . Diabetes Son    . Healthy Brother        #1  . Heart attack Brother        #2  . Heart disease Brother        #2     Prior to Admission medications   Medication  Sig Start Date End Date Taking? Authorizing Provider  acetaminophen (TYLENOL) 325 MG tablet Take 2 tablets (650 mg total) by mouth every 6 (six) hours as needed for mild pain (or Fever >/= 101). 09/03/17  Yes Vann, Jessica U, DO  albuterol (PROVENTIL HFA;VENTOLIN HFA) 108 (90 Base) MCG/ACT inhaler Inhale 2 puffs into the lungs every 6 (six) hours as needed for wheezing or shortness of breath.   Yes [provider]  amiodarone (PACERONE) 200 MG tablet Take 200 mg by mouth daily.   Yes [provider]  apixaban (ELIQUIS) 5 MG TABS tablet Take 1 tablet (5 mg total) by mouth 2 (two) times daily. 02/18/18  Yes Evans Lance, MD  budesonide-formoterol [facility]) 160-4.5 MCG/ACT inhaler Inhale 2 puffs into the lungs 2 (two) times daily. 01/23/18  Yes Collene Gobble, MD  exemestane (AROMASIN) 25 MG tablet TAKE ONE TABLET BY MOUTH DAILY AFTER BREAKFAST Patient taking differently: Take 25 mg by mouth daily after breakfast.  11/20/17  Yes Nicholas Lose, MD  ezetimibe (ZETIA) 10 MG tablet Take 1 tablet (10 mg total) by mouth daily. 01/24/18  Yes Evans Lance, MD  fludrocortisone (FLORINEF) 0.1 MG tablet Take 1 tablet (0.1 mg total) by mouth daily. 10/01/17  Yes [name], NP  furosemide (LASIX) 20 MG tablet Take 1 tablet (20 mg total) by mouth daily as needed for fluid. 02/04/18  Yes Harris, Abigail, PA-C  Insulin Disposable Pump (V-GO 20) KIT 1 each by Does not apply route daily. 05/21/18  Yes Elayne Snare, MD  insulin lispro (HUMALOG KWIKPEN) 100 UNIT/ML KiwkPen 2-6 Units at bedtime as needed (CBG >200).    Yes [provider]  insulin lispro (HUMALOG) 100 UNIT/ML injection INJECT 76 UNITS DAILY WITH  V-GO PUMP 09/10/17  Yes Elayne Snare, MD  metoprolol tartrate (LOPRESSOR) 25 MG tablet May take a total of 4 tablets in a 24 hour  period. Patient taking differently: Take 25 mg by mouth daily. May take a total of 4 tablets in a 24 hour period. 12/09/17  Yes [name], NP  omeprazole (PRILOSEC) 40 MG capsule Take 1 capsule (40 mg total) by mouth daily. 05/14/18  Yes Saguier, Percell Miller, PA-C  rosuvastatin (CRESTOR) 40 MG tablet TAKE ONE TABLET BY MOUTH DAILY Patient taking differently: Take 40 mg by mouth daily.  08/28/17  Yes [name], NP  sucralfate (CARAFATE) 1 g tablet Take 1 tablet (1 g total) by mouth 4 (four) times daily -  with meals and at bedtime. 05/14/18  Yes Saguier, Percell Miller, PA-C  TRULICITY 1.5 UD/3.1YH SOPN INJECT THE CONTENTS OF 1  PEN SUBCUTANEOUSLY WEEKLY  AS DIRECTED Patient taking differently: Inject 1 pen into the skin every Sunday.  04/15/18  Yes Elayne Snare, MD  zolpidem (AMBIEN) 5 MG tablet Take 1 tablet (5 mg total) by mouth at bedtime as needed for sleep. 05/06/18  Yes Saguier, Percell Miller, PA-C  ACCU-CHEK GUIDE test strip Use to test blood sugar 3 times daily 09/09/17   [provider]  ACCU-CHEK GUIDE test strip Use to test blood sugar 3 times daily 02/18/18   Elayne Snare, MD    Physical Exam:  Vitals:   05/21/18 1500 05/21/18 1700 05/21/18 2000 05/21/18 2001  BP: (!) 135/50 (!) 182/50  (!) 128/114  Pulse: (!) 58 63 63 61  Resp: (!) _0 Temp:      TempSrc:      SpO2: 100% 100% 100% 100%  Weight:  Height:       Constitutional: NAD, calm, comfortable Eyes: PERRL, lids and conjunctivae normal ENMT: Mucous membranes are moist. Posterior pharynx clear of any exudate or lesions.Normal dentition.  Neck: normal, supple, no masses. Respiratory: clear to auscultation bilaterally, no wheezing, no crackles. Normal respiratory effort. No accessory muscle use.  Cardiovascular: Regular rate and rhythm, systolic murmur and click at the upper sternal borders. No extremity edema. 2+ pedal pulses. Abdomen: Mild RUQ tenderness, no masses palpated. No hepatosplenomegaly. Bowel sounds  positive.  Musculoskeletal: no clubbing / cyanosis. No joint deformity upper and lower extremities. Good ROM, no contractures. Normal muscle tone.  Skin: no rashes, lesions, ulcers. No induration Neurologic: CN 2-12 grossly intact. Sensation intact, DTR normal. Strength 5/5 in all 4.  Psychiatric: Normal judgment and insight. Alert and oriented x 3. Normal mood.     Labs on Admission: I have personally reviewed following labs and imaging studies  CBC: Recent Labs  Lab 05/21/18 1418  WBC 8.2  HGB 10.8*  HCT 36.3  MCV 99.5  PLT 361*   Basic Metabolic Panel: Recent Labs  Lab 05/21/18 1418 05/21/18 1504  NA 142  --   K 3.5  --   CL 106  --   CO2 23  --   GLUCOSE 143*  --   BUN 29*  --   CREATININE 2.37*  --   CALCIUM 9.3  --   MG  --  2.0   GFR: Estimated Creatinine Clearance: 23.1 mL/min (A) (by C-G formula based on SCr of 2.37 mg/dL (H)). Liver Function Tests: Recent Labs  Lab 05/21/18 1530  AST 55*  ALT 49*  ALKPHOS 59  BILITOT 1.2  PROT 6.4*  ALBUMIN 3.1*   Recent Labs  Lab 05/21/18 1530  LIPASE 22   No results for input(s): AMMONIA in the last 168 hours. Coagulation Profile: Recent Labs  Lab 05/21/18 1450  INR 1.91   Cardiac Enzymes: No results for input(s): CKTOTAL, CKMB, CKMBINDEX, TROPONINI in the last 168 hours. BNP (last 3 results) No results for input(s): PROBNP in the last 8760 hours. HbA1C: No results for input(s): HGBA1C in the last 72 hours. CBG: Recent Labs  Lab 05/21/18 1954  GLUCAP 92   Lipid Profile: No results for input(s): CHOL, HDL, LDLCALC, TRIG, CHOLHDL, LDLDIRECT in the last 72 hours. Thyroid Function Tests: No results for input(s): TSH, T4TOTAL, FREET4, T3FREE, THYROIDAB in the last 72 hours. Anemia Panel: No results for input(s): VITAMINB12, FOLATE, FERRITIN, TIBC, IRON, RETICCTPCT in the last 72 hours. Urine analysis:    Component Value Date/Time   COLORURINE YELLOW 05/21/2018 1736   APPEARANCEUR CLOUDY (A)  05/21/2018 1736   LABSPEC 1.023 05/21/2018 1736   LABSPEC 1.020 02/24/2016 1119   PHURINE 7.0 05/21/2018 1736   GLUCOSEU 50 (A) 05/21/2018 1736   GLUCOSEU Negative 02/24/2016 1119   HGBUR MODERATE (A) 05/21/2018 1736   BILIRUBINUR NEGATIVE 05/21/2018 1736   BILIRUBINUR Negative 02/24/2016 1119   KETONESUR NEGATIVE 05/21/2018 1736   PROTEINUR 30 (A) 05/21/2018 1736   UROBILINOGEN 0.2 02/24/2016 1119   NITRITE NEGATIVE 05/21/2018 1736   LEUKOCYTESUR LARGE (A) 05/21/2018 1736   LEUKOCYTESUR Trace 02/24/2016 1119    Radiological Exams on Admission: Ct Angio Head W Or Wo Contrast  Result Date: 05/21/2018 CLINICAL DATA:  Generalized weakness and headache. EXAM: CT ANGIOGRAPHY HEAD AND NECK TECHNIQUE: Multidetector CT imaging of the head and neck was performed using the standard protocol during bolus administration of intravenous contrast. Multiplanar CT image reconstructions and  MIPs were obtained to evaluate the vascular anatomy. Carotid stenosis measurements (when applicable) are obtained utilizing NASCET criteria, using the distal internal carotid diameter as the denominator. CONTRAST:  149m ISOVUE-370 IOPAMIDOL (ISOVUE-370) INJECTION 76% COMPARISON:  None. FINDINGS: CT HEAD FINDINGS Brain: There is no mass, hemorrhage or extra-axial collection. The size and configuration of the ventricles and extra-axial CSF spaces are normal. There is no acute or chronic infarction. The brain parenchyma is normal. Skull: The visualized skull base, calvarium and extracranial soft tissues are normal. Sinuses/Orbits: No fluid levels or advanced mucosal thickening of the visualized paranasal sinuses. No mastoid or middle ear effusion. The orbits are normal. CTA NECK FINDINGS SKELETON: There is no bony spinal canal stenosis. No lytic or blastic lesion. OTHER NECK: Normal pharynx, larynx and major salivary glands. No cervical lymphadenopathy. Unremarkable thyroid gland. UPPER CHEST: No pneumothorax or pleural  effusion. No nodules or masses. AORTIC ARCH: There is mild calcific atherosclerosis of the aortic arch. There is no aneurysm, dissection or hemodynamically significant stenosis of the visualized ascending aorta and aortic arch. Conventional 3 vessel aortic branching pattern. The visualized proximal subclavian arteries are widely patent. RIGHT CAROTID SYSTEM: --Common carotid artery: Widely patent origin without common carotid artery dissection or aneurysm. --Internal carotid artery: No dissection, occlusion or aneurysm. There is calcified atherosclerosis extending into the proximal ICA, resulting in 60% stenosis. --External carotid artery: No acute abnormality. LEFT CAROTID SYSTEM: --Common carotid artery: Widely patent origin without common carotid artery dissection or aneurysm. --Internal carotid artery: No dissection, occlusion or aneurysm. There is calcified atherosclerosis extending into the proximal ICA, resulting in less than 50% stenosis. --External carotid artery: No acute abnormality. VERTEBRAL ARTERIES: Codominant configuration. There is atherosclerotic calcification of both vertebral artery origins. There is atherosclerotic calcification of both V3 segments without flow limiting stenosis. No dissection. CTA HEAD FINDINGS ANTERIOR CIRCULATION: --Intracranial internal carotid arteries: Atherosclerotic calcification of the internal carotid arteries at the skull base without hemodynamically significant stenosis. --Anterior cerebral arteries: Normal. Both A1 segments are present. Patent anterior communicating artery. --Middle cerebral arteries: Normal. --Posterior communicating arteries: Present on the left, absent on the right. POSTERIOR CIRCULATION: --Basilar artery: Normal. --Posterior cerebral arteries: Normal. --Superior cerebellar arteries: Normal. --Inferior cerebellar arteries: Normal anterior and posterior inferior cerebellar arteries. VENOUS SINUSES: As permitted by contrast timing, patent. ANATOMIC  VARIANTS: None DELAYED PHASE: No parenchymal contrast enhancement. Review of the MIP images confirms the above findings. IMPRESSION: 1. No emergent large vessel occlusion or high-grade stenosis. 2. Approximately 60% stenosis of the proximal right internal carotid artery secondary to calcified atherosclerosis. 3.  Aortic atherosclerosis (ICD10-I70.0). 4. Bilateral atherosclerotic narrowing of the vertebral artery origins. Electronically Signed   By: KUlyses JarredM.D.   On: 05/21/2018 17:14   Dg Chest 2 View  Result Date: 05/21/2018 CLINICAL DATA:  Back pain. EXAM: CHEST - 2 VIEW COMPARISON:  Radiographs of February 04, 2018. FINDINGS: The heart size and mediastinal contours are within normal limits. Status post aortic valve replacement. No pneumothorax or pleural effusion is noted. Both lungs are clear. The visualized skeletal structures are unremarkable. IMPRESSION: No active cardiopulmonary disease. Electronically Signed   By: JMarijo Conception M.D.   On: 05/21/2018 15:44   Ct Angio Neck W And/or Wo Contrast  Result Date: 05/21/2018 CLINICAL DATA:  Generalized weakness and headache. EXAM: CT ANGIOGRAPHY HEAD AND NECK TECHNIQUE: Multidetector CT imaging of the head and neck was performed using the standard protocol during bolus administration of intravenous contrast. Multiplanar CT image reconstructions and MIPs were  obtained to evaluate the vascular anatomy. Carotid stenosis measurements (when applicable) are obtained utilizing NASCET criteria, using the distal internal carotid diameter as the denominator. CONTRAST:  134m ISOVUE-370 IOPAMIDOL (ISOVUE-370) INJECTION 76% COMPARISON:  None. FINDINGS: CT HEAD FINDINGS Brain: There is no mass, hemorrhage or extra-axial collection. The size and configuration of the ventricles and extra-axial CSF spaces are normal. There is no acute or chronic infarction. The brain parenchyma is normal. Skull: The visualized skull base, calvarium and extracranial soft tissues are  normal. Sinuses/Orbits: No fluid levels or advanced mucosal thickening of the visualized paranasal sinuses. No mastoid or middle ear effusion. The orbits are normal. CTA NECK FINDINGS SKELETON: There is no bony spinal canal stenosis. No lytic or blastic lesion. OTHER NECK: Normal pharynx, larynx and major salivary glands. No cervical lymphadenopathy. Unremarkable thyroid gland. UPPER CHEST: No pneumothorax or pleural effusion. No nodules or masses. AORTIC ARCH: There is mild calcific atherosclerosis of the aortic arch. There is no aneurysm, dissection or hemodynamically significant stenosis of the visualized ascending aorta and aortic arch. Conventional 3 vessel aortic branching pattern. The visualized proximal subclavian arteries are widely patent. RIGHT CAROTID SYSTEM: --Common carotid artery: Widely patent origin without common carotid artery dissection or aneurysm. --Internal carotid artery: No dissection, occlusion or aneurysm. There is calcified atherosclerosis extending into the proximal ICA, resulting in 60% stenosis. --External carotid artery: No acute abnormality. LEFT CAROTID SYSTEM: --Common carotid artery: Widely patent origin without common carotid artery dissection or aneurysm. --Internal carotid artery: No dissection, occlusion or aneurysm. There is calcified atherosclerosis extending into the proximal ICA, resulting in less than 50% stenosis. --External carotid artery: No acute abnormality. VERTEBRAL ARTERIES: Codominant configuration. There is atherosclerotic calcification of both vertebral artery origins. There is atherosclerotic calcification of both V3 segments without flow limiting stenosis. No dissection. CTA HEAD FINDINGS ANTERIOR CIRCULATION: --Intracranial internal carotid arteries: Atherosclerotic calcification of the internal carotid arteries at the skull base without hemodynamically significant stenosis. --Anterior cerebral arteries: Normal. Both A1 segments are present. Patent anterior  communicating artery. --Middle cerebral arteries: Normal. --Posterior communicating arteries: Present on the left, absent on the right. POSTERIOR CIRCULATION: --Basilar artery: Normal. --Posterior cerebral arteries: Normal. --Superior cerebellar arteries: Normal. --Inferior cerebellar arteries: Normal anterior and posterior inferior cerebellar arteries. VENOUS SINUSES: As permitted by contrast timing, patent. ANATOMIC VARIANTS: None DELAYED PHASE: No parenchymal contrast enhancement. Review of the MIP images confirms the above findings. IMPRESSION: 1. No emergent large vessel occlusion or high-grade stenosis. 2. Approximately 60% stenosis of the proximal right internal carotid artery secondary to calcified atherosclerosis. 3.  Aortic atherosclerosis (ICD10-I70.0). 4. Bilateral atherosclerotic narrowing of the vertebral artery origins. Electronically Signed   By: KUlyses JarredM.D.   On: 05/21/2018 17:14   Ct Angio Chest Pe W And/or Wo Contrast  Result Date: 05/21/2018 CLINICAL DATA:  Chest pain. Back pain. EXAM: CT ANGIOGRAPHY CHEST WITH CONTRAST TECHNIQUE: Multidetector CT imaging of the chest was performed using the standard protocol during bolus administration of intravenous contrast. Multiplanar CT image reconstructions and MIPs were obtained to evaluate the vascular anatomy. CONTRAST:  [id number] IOPAMIDOL (ISOVUE-370) INJECTION 76% COMPARISON:  Chest x-ray dated 05/21/2018 FINDINGS: Cardiovascular: Satisfactory opacification of the pulmonary arteries to the segmental level. No evidence of pulmonary embolism. Normal heart size. Extensive aortic atherosclerosis and coronary artery calcifications. No pericardial effusion. Mediastinum/Nodes: No enlarged mediastinal, hilar, or axillary lymph nodes. Thyroid gland, trachea, and esophagus demonstrate no significant findings. Lungs/Pleura: Lungs are clear. No pleural effusion or pneumothorax. Upper Abdomen: No acute abnormality. Musculoskeletal:  No chest  wall abnormality. No acute or significant osseous findings. There is a 4 cm cystic structure in the right breast consistent with a seroma from previous breast surgery. Markers are adjacent to the cyst. Review of the MIP images confirms the above findings. IMPRESSION: 1. No pulmonary emboli or other acute abnormalities. 2. Benign-appearing seroma in the right breast. Aortic Atherosclerosis (ICD10-I70.0). Electronically Signed   By: Lorriane Shire M.D.   On: 05/21/2018 17:15    EKG: Independently reviewed.  Sinus rhythm, rate 60 bpm, prolonged QTC  Assessment/Plan Principal Problem:   Syncope Active Problems:   Essential hypertension   Asthma   Hyperlipidemia   Type 2 diabetes mellitus (HCC)   CKD (chronic kidney disease), stage IV (HCC)   Chronic diastolic CHF (congestive heart failure) (HCC)   AKI (acute kidney injury) (HCC)   Atrial fibrillation (HCC)   Prolonged Q-T interval on ECG   Hannah [name] is a 72 y.o. female with medical history significant for chronic diastolic CHF, CAD status post CABG, status post bioprosthetic AVR, atrial fibrillation on Eliquis, insulin-dependent type 2 diabetes, CKD stage IV, carotid artery stenosis, and history of recurrent orthostasis/syncope who presents to the ED with an episode of syncope/near syncope.   Syncope: Unclear cause, with known significant cardiac disease differentials include cardiogenic, vasovagal, dehydration/orthostasis, or seizure. Has PaceArt in place, follows with EP, Dr. Lovena Le outpatient. - admit to telemetry - will need PaceArt interrogation - check orthostatics - EEG given questionable post-ictal symptoms - Continue home Florinef   Acute on Chronic CKD: Cr 2.37 compared to 1.65 one week ago. S/p 500 mL NS bolus in ED, hold further fluids with hx of CHF. -Repeat BMET in AM   Prolonged QTc: Seen on EKG, new compared to prior. Potentially Amiodarone side effect, although had recent decrease in dosage. -Monitor telemetry,  repeat EKG in AM  Atrial Fibrillation on Eliquis: In sinus rhythm with rate controlled. -Continue Eliquis  Chronic diastolic CHF (EF 40-34%, grade 2 DD by TTE 02/07/18): Appears euvolemic to mildy dehydrated. On lasix only as needed at home, has not needed to use for over a month. -Hold lasix with AKI  IDT2DM: A1c 6.5%. -SSI while inpatient  CAD s/p CABG HLD: Chronic and stable, denies chest pain. -Continue home rosuvastatin and ezetimibe.  Asthma: -Continue home symbicort and prn albuterol    DVT prophylaxis: Eliquis Code Status: Full code Family Communication: Husband, son, and daughter-in-law at bedside Disposition Plan: Discharged home in 1 to 2 days Consults called: None Admission status: Observation   Zada Finders MD [facility] Pager [phone]  If 7PM-7AM, please contact night-coverage [email or web address] Password [facility]  05/21/2018, 8:19 PM

## 2018-05-21 NOTE — ED Provider Notes (Signed)
[facility] EMERGENCY DEPARTMENT Provider Note   CSN: [id number] Arrival date & time: 05/21/18  1406     History   Chief Complaint Chief Complaint  Patient presents with  . Loss of Consciousness  . Weakness  . Headache    HPI Hannah [name] is a 72 y.o. female with a hx of seizures, breast cancer in remission, CHF (last EF 60-65%), CAD, s/p bioprosthetic aortic valve replacement, bilateral carotid artery stenosis, PAF on Eliquis, T2DM, and prior stroke who presents to the ED with complaints of likely syncopale episode, generalized weakness, and neck pain. Patient reports that she was generally not feeling well upon arrival to [facility] to do some shopping this afternoon at 12:30. She states that she ate some peanutbutter crackers to ensure it was not due to hypoglycemia, but this did not have much effect. As she was shopping she developed fairly quick onset pain to the upper back & neck which radiated into the back of the head. She states pain has been constant without specific alleviating/aggravating factors. She reports w/ the pain she developed generalized weakness w/ lightheadedness & near syncope sensation. She went to check out and requested the cashier get her a chair to sit down in. She states her son called her and she expressed she did not feel well and this is the last thing that she remembers. Her son at bedside reports that he spoke with the cashier and instructed they call 911, at that time patient appeared somewhat clammy and pale. The next thing patient remembers is EMS arriving on scene. They gave her aspirin en route. She states she does not know if she passed out. Ems informed RN that patient initially was not following commands well or very responsive. Currently patient remains with generalized weakness, pain to upper back/neck/head that is an 8/10 in severity, stabbing in nature. She states that she has some tightness/paresthesias to the LUE, no other  numbness/paresthesias, no focal weakness. Has had some mild nausea, no vomiting.   She woke up feeling generally well and has eaten/dranken this AM. Denies chest pain, focal weakness, numbness, change in vision, dizziness like the room spinning, vomiting, diarrhea, or abdominal pain.   HPI  Past Medical History:  Diagnosis Date  . Abnormally small mouth   . Acute on chronic combined systolic and diastolic congestive heart failure ([place]) 07/17/2013  . Adrenal insufficiency ([place]) 07/03/2016  . AKI (acute kidney injury) ([place]) 09/01/2017  . Allergic rhinitis 10/14/2009   Qualifier: Diagnosis of  By: Lamonte Sakai MD, Rose Fillers   Overview:  Overview:  Qualifier: Diagnosis of  By: Lamonte Sakai MD, Rose Fillers  Last Assessment & Plan:  Please continue Xyzal and Nasacort as you have been using them  . Anemia   . Asthma 05/12/2009   10/12/2014 p extensive coaching HFA effectiveness =    75% s spacer    Overview:  Overview:  10/12/2014 p extensive coaching HFA effectiveness =    75% s spacer   Last Assessment & Plan:  Please continue Symbicort 2 puffs twice a day. Remember to rinse and gargle after taking this medication. Take albuterol 2 puffs up to every 4 hours if needed for shortness of breath.  Follow with Dr Lamonte Sakai in 6 month  . Atrial tachycardia ([place]) 04/03/2013   Overview:  Last Assessment & Plan:  I have discussed the likely benign nature of her problem. She has very minimal palpitations. I doubt atrial flutter. I would not anti-coagulate. Her monitor demonstrates that  her episodes are short lived. Will follow.   . Bilateral carotid artery stenosis    Bilateral ICA 40-59%/  >50% LECA  . Breast cancer ([place]) 01/18/2016   right breast  . Breast cancer of upper-inner quadrant of right female breast ([place]) 12/16/2015  . CAD (coronary artery disease) cardiologist-  dr Aundra Dubin   Remote CABG in 1998 with redo in 2009  . Chemotherapy induced nausea and vomiting 02/24/2016  . Chemotherapy-induced peripheral neuropathy ([place]) 04/27/2016    . 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 CHF (congestive heart failure) (HCC)    EF is low normal at 50 to 55% per echo in Jan. 2011  . Chronic coronary artery disease 01/24/2011   Overview:  Last Assessment & Plan:  Stable with no ischemic symptoms.  Continue ASA, statin, ACEI, beta blocker.   . Chronic diastolic CHF (congestive heart failure) ([place]) 09/24/2013  . CKD (chronic kidney disease), stage IV ([place]) 09/24/2013  . Closed fracture of head of left humerus 09/09/2017  . Essential hypertension 05/12/2009   Qualifier: Diagnosis of  By: Lamonte Sakai MD, Rose Fillers   . GERD (gastroesophageal reflux disease)   . H/O atrial tachycardia 05/12/2009   Qualifier: History of  By: Lamonte Sakai MD, Rose Fillers   Overview:  Overview:  Qualifier: History of  By: Lamonte Sakai MD, Rose Fillers  Last Assessment & Plan:  There was no evidence of this on her cardiac monitor. I would recommend watchful waiting.   Marland Kitchen Hearing loss of right ear   . History of aortic valve replacement 10/27/2010   Overview:  Last Assessment & Plan:  Will get echo to reassess bioprosthetic aortic valve.   Marland Kitchen History of non-ST elevation myocardial infarction (NSTEMI)    Sept 2014--  thought to be type II HTN w/ LHC without infarct related artery and patent grafts  . History of radiation therapy 05/24/16-07/26/16   right breast 50.4 Gy in 28 fractions, right breast boost 10 Gy in 5 fractions  . Hyperlipidemia   . Hypertension    Resolved.  . Iron deficiency anemia   . Mania ([place]) 03/05/2016  . Moderate persistent asthma    pulmologist-  Dr. Malvin Johns  . Nonischemic cardiomyopathy ([place])   . Osteomyelitis of toe of left foot ([place]) 04/06/2016  . Osteopenia of multiple sites 10/19/2015  . PAF (paroxysmal atrial fibrillation) ([place])   . Personal history of chemotherapy 2017  . Personal history of radiation therapy 2017  . Port catheter in place 02/17/2016  . Psoriasis    right leg  . Renal calculus, right   . Renal insufficiency, mild   . S/P AVR     prosthesis valve placement 2009 at same time re-do CABG  . Seizure ([place]) 06/29/2016  . Seizure-like activity ([place]) 09/01/2017  . Sensorineural hearing loss (SNHL) of both ears 01/06/2016  . Stroke [facility])    residual rt hearing loss  . Syncope 09/06/2017  . Type 2 diabetes mellitus ([place])    monitored by dr Dwyane Dee    Patient Active Problem List   Diagnosis Date Noted  . Atrial fibrillation ([place]) 01/23/2018  . Closed fracture of head of left humerus 09/09/2017  . Syncope 09/06/2017  . Seizure-like activity ([place]) 09/01/2017  . AKI (acute kidney injury) ([place]) 09/01/2017  . Adrenal insufficiency ([place]) 07/03/2016  . Seizure ([place]) 06/29/2016  . Chemotherapy-induced peripheral neuropathy ([place]) 04/27/2016  . 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 Osteomyelitis of toe of left foot ([place]) 04/06/2016  . Mania ([place]) 03/05/2016  . GERD (gastroesophageal reflux disease)  02/29/2016  . Chemotherapy induced nausea and vomiting 02/24/2016  . Port catheter in place 02/17/2016  . Sensorineural hearing loss (SNHL) of both ears 01/06/2016  . Breast cancer of upper-inner quadrant of right female breast ([place]) 12/16/2015  . Osteopenia of multiple sites 10/19/2015  . CKD (chronic kidney disease), stage IV ([place]) 09/24/2013  . Chronic diastolic CHF (congestive heart failure) ([place]) 09/24/2013  . Acute on chronic combined systolic and diastolic congestive heart failure ([place]) 07/17/2013  . Atrial tachycardia ([place]) 04/03/2013  . CAD (coronary artery disease) 01/24/2011  . Chronic coronary artery disease 01/24/2011  . History of aortic valve replacement 10/27/2010  . Hyperlipidemia   . Nonischemic cardiomyopathy ([place])   . Anemia   . Type 2 diabetes mellitus ([place])   . Iron deficiency anemia   . Allergic rhinitis 10/14/2009  . Essential hypertension 05/12/2009  . H/O atrial tachycardia 05/12/2009  . Asthma 05/12/2009    Past Surgical History:  Procedure Laterality Date  . AORTIC VALVE REPLACEMENT   [facility]   [facility] same [facility] is [facility]  . BREAST LUMPECTOMY Right 01/18/2016  . BREAST LUMPECTOMY WITH RADIOACTIVE SEED AND SENTINEL LYMPH NODE BIOPSY Right 01/18/2016   Procedure: RIGHT BREAST LUMPECTOMY WITH RADIOACTIVE SEED AND SENTINEL LYMPH NODE BIOPSY;  Surgeon: DAlphonsa Overall MD;  [facility]  Service: General;  Laterality: Right;  . CARDIAC CATHETERIZATION  03/23/2008   Pre-redo CABG: L main OK, LAD (T), CFX (T), OM1 99%, RCA (T), LIMA-LAD OK, SVG-OM(?3) OK w/ little florw to OM2, SVG-RCA OK. EF NL  . CARPAL TUNNEL RELEASE    . COLONOSCOPY    . CORONARY ARTERY BYPASS GRAFT  1998 &  re-do 2009   Had LIMA to DX/LAD, SVG to 2 marginal branches and [facility]; SVG to 3rd OM and PD at time of redo  . CYSTOSCOPY W/ URETERAL STENT PLACEMENT Right 12/20/2014   Procedure: CYSTOSCOPY WITH RETROGRADE PYELOGRAM/URETERAL STENT PLACEMENT;  Surgeon: PCleon Gustin MD;  [facility]  Service: Urology;  Laterality: Right;  . EYE SURGERY Bilateral    cataracts  . HOLMIUM LASER APPLICATION Right 76/[id number]/4193  Procedure:  HOLMIUM LASER LITHOTRIPSY;  Surgeon: PCleon Gustin MD;  [facility]  Service: Urology;  Laterality: Right;  . LEFT HEART CATHETERIZATION WITH CORONARY/GRAFT ANGIOGRAM N/A 02/23/2013   Procedure: LEFT HEART CATHETERIZATION WITH CBeatrix Fetters  Surgeon: MBlane Ohara MD;  [facility];  Service: Cardiovascular;  Laterality: N/A;  . LOOP RECORDER INSERTION N/A 08/30/2017   Procedure: LOOP RECORDER INSERTION;  Surgeon: TEvans Lance MD;  [facility];  Service: Cardiovascular;  Laterality: N/A;  . PORTACATH PLACEMENT Left 01/18/2016   Procedure: INSERTION PORT-A-CATH;  Surgeon: DAlphonsa Overall MD;  [facility]  Service: General;  Laterality: Left;  . portacath removal    . TONSILLECTOMY    . TRANSTHORACIC ECHOCARDIOGRAM  02-24-2013      mild  LVH,  ef 50-55%/  AV bioprosthesis was present with very mild stenosis and no regurg., mean grandient 147mg, peak grandient 2010m /  mild MR/  mild LAE and RAE/  moderate TR  . TUBAL LIGATION       OB History   None      Home Medications    Prior to Admission medications   Medication Sig Start Date End Date Taking? Authorizing Provider  ACCU-CHEK GUIDE test strip Use to test blood sugar 3 times  daily 09/09/17   [provider]  ACCU-CHEK GUIDE test strip Use to test blood sugar 3 times daily 02/18/18   Elayne Snare, MD  acetaminophen (TYLENOL) 325 MG tablet Take 2 tablets (650 mg total) by mouth every 6 (six) hours as needed for mild pain (or Fever >/= 101). 09/03/17   Eulogio Bear U, DO  albuterol (PROVENTIL HFA;VENTOLIN HFA) 108 (90 Base) MCG/ACT inhaler Inhale 2 puffs into the lungs every 6 (six) hours as needed for wheezing or shortness of breath.    [provider]  amiodarone (PACERONE) 200 MG tablet Take 200 mg by mouth daily.    [provider]  apixaban (ELIQUIS) 5 MG TABS tablet Take 1 tablet (5 mg total) by mouth 2 (two) times daily. 02/18/18   Evans Lance, MD  budesonide-formoterol [facility]) 160-4.5 MCG/ACT inhaler Inhale 2 puffs into the lungs 2 (two) times daily. 01/23/18   Collene Gobble, MD  exemestane (AROMASIN) 25 MG tablet TAKE ONE TABLET BY MOUTH DAILY AFTER BREAKFAST 11/20/17   Nicholas Lose, MD  ezetimibe (ZETIA) 10 MG tablet Take 1 tablet (10 mg total) by mouth daily. 01/24/18   Evans Lance, MD  fludrocortisone (FLORINEF) 0.1 MG tablet Take 1 tablet (0.1 mg total) by mouth daily. 10/01/17   [name], NP  furosemide (LASIX) 20 MG tablet Take 1 tablet (20 mg total) by mouth daily as needed for fluid. 02/04/18   Margarita Mail, PA-C  Insulin Disposable Pump (V-GO 30) KIT 1 Device by Does not apply route daily. Patient taking differently: 1 Device by Does not apply route continuous.  08/05/17   Elayne Snare, MD  insulin lispro  (HUMALOG KWIKPEN) 100 UNIT/ML KiwkPen 2-6 Units at bedtime as needed (CBG >200).     [provider]  insulin lispro (HUMALOG) 100 UNIT/ML injection INJECT 76 UNITS DAILY WITH  V-GO PUMP 09/10/17   Elayne Snare, MD  metoprolol tartrate (LOPRESSOR) 25 MG tablet May take a total of 4 tablets in a 24 hour period. 12/09/17   [name], NP  omeprazole (PRILOSEC) 40 MG capsule Take 1 capsule (40 mg total) by mouth daily. 05/14/18   Saguier, Percell Miller, PA-C  rosuvastatin (CRESTOR) 40 MG tablet TAKE ONE TABLET BY MOUTH DAILY Patient taking differently: Take 40 mg by mouth daily.  08/28/17   [name], NP  sucralfate (CARAFATE) 1 g tablet Take 1 tablet (1 g total) by mouth 4 (four) times daily -  with meals and at bedtime. 05/14/18   Saguier, Percell Miller, PA-C  TRULICITY 1.5 PY/0.5RT SOPN INJECT THE CONTENTS OF 1  PEN SUBCUTANEOUSLY WEEKLY  AS DIRECTED 04/15/18   Elayne Snare, MD  zolpidem (AMBIEN) 5 MG tablet Take 1 tablet (5 mg total) by mouth at bedtime as needed for sleep. 05/06/18   Saguier, Percell Miller, PA-C    Family History Family History  Problem Relation Age of Onset  . Heart disease Father   . Heart failure Father   . Diabetes Maternal Grandmother   . Heart disease Maternal Grandmother   . Diabetes Son   . Healthy Brother        #1  . Heart attack Brother        #2  . Heart disease Brother        #2    Social History Social History   Tobacco Use  . Smoking status: Never Smoker  . Smokeless tobacco: Never Used  Substance Use Topics  . Alcohol use: No  . Drug  use: No     Allergies   Amoxicillin; Imdur [isosorbide dinitrate]; Tape; Arimidex [anastrozole]; Latex; and Tetracycline   Review of Systems Review of Systems  Constitutional: Negative for chills and fever.  Respiratory: Negative for shortness of breath.   Cardiovascular: Negative for chest pain.  Gastrointestinal: Positive for nausea. Negative for abdominal pain, blood in stool, constipation, diarrhea and  vomiting.  Genitourinary: Negative for dysuria.  Musculoskeletal: Positive for back pain and neck pain.  Neurological: Positive for syncope (v.s near syncope), weakness (generalized), light-headedness and headaches.       Positive for LUE paresthesias.   All other systems reviewed and are negative.   Physical Exam Updated Vital Signs Ht 5' 5"  (1.651 m)   Wt 84.8 kg   SpO2 99%   BMI 31.11 kg/m   Physical Exam  Constitutional: She appears well-developed and well-nourished.  Non-toxic appearance. No distress.  HENT:  Head: Normocephalic and atraumatic. Head is without raccoon's eyes and without Battle's sign.  Mouth/Throat: Uvula is midline. Mucous membranes are dry.  Eyes: Pupils are equal, round, and reactive to light. Conjunctivae and EOM are normal. Right eye exhibits no discharge. Left eye exhibits no discharge.  Neck: Spinous process tenderness (diffuse non focal) and muscular tenderness (bilateral) present. No neck rigidity. No edema and no erythema present.  Cardiovascular: Normal rate and regular rhythm.  Murmur heard. Pulmonary/Chest: Effort normal and breath sounds normal. No respiratory distress. She has no wheezes. She has no rhonchi. She has no rales.  Respiration even and unlabored  Abdominal: Soft. Normal appearance. She exhibits no distension. There is no tenderness.  Musculoskeletal:  No obvious deformity, appreciable swelling, erythema, ecchymosis, or open wounds.  Back: Some upper thoracic midline and bilateral paraspinal muscle tenderness to palpation. No palpable step off.   Neurological: She is alert.  Alert. Clear speech. No facial droop. CNIII-XII grossly intact. Bilateral upper and lower extremities' sensation grossly intact. 5/5 symmetric strength with grip strength and with plantar and dorsi flexion bilaterally. Normal finger to nose bilaterally. Negative pronator drift.  Skin: Skin is warm and dry. No rash noted.  Psychiatric: She has a normal mood and  affect. Her behavior is normal.  Nursing note and vitals reviewed.    ED Treatments / Results  Labs (all labs ordered are listed, but only abnormal results are displayed) Labs Reviewed  BASIC METABOLIC PANEL - Abnormal; Notable for the following components:      Result Value   Glucose, Bld 143 (*)    BUN 29 (*)    Creatinine, Ser 2.37 (*)    GFR calc non Af Amer 20 (*)    GFR calc Af Amer 23 (*)    All other components within normal limits  CBC - Abnormal; Notable for the following components:   RBC 3.65 (*)    Hemoglobin 10.8 (*)    MCHC 29.8 (*)    Platelets 145 (*)    All other components within normal limits  URINALYSIS, ROUTINE W REFLEX MICROSCOPIC - Abnormal; Notable for the following components:   APPearance CLOUDY (*)    Glucose, UA 50 (*)    Hgb urine dipstick MODERATE (*)    Protein, ur 30 (*)    Leukocytes, UA LARGE (*)    WBC, UA >50 (*)    Non Squamous Epithelial 0-5 (*)    All other components within normal limits  PROTIME-INR - Abnormal; Notable for the following components:   Prothrombin Time 21.7 (*)    All other components  within normal limits  HEPATIC FUNCTION PANEL - Abnormal; Notable for the following components:   Total Protein 6.4 (*)    Albumin 3.1 (*)    AST 55 (*)    ALT 49 (*)    Bilirubin, Direct 0.3 (*)    All other components within normal limits  MAGNESIUM  LIPASE, BLOOD  I-STAT TROPONIN, ED    EKG EKG Interpretation  Date/Time:  Wednesday May 21 2018 14:16:50 EST Ventricular Rate:  62 PR Interval:    QRS Duration: 102 QT Interval:  543 QTC Calculation: 552 R Axis:   -7 Text Interpretation:  Sinus rhythm RSR' in V1 or V2, right VCD or RVH Prolonged QT interval Confirmed by Lennice Sites [phone]) on 05/21/2018 3:00:55 PM   Radiology US Abdomen Complete  Result Date: 05/19/2018 CLINICAL DATA:  Nausea dyspepsia right upper quadrant pain EXAM: ABDOMEN ULTRASOUND COMPLETE COMPARISON:  07/02/2017 renal ultrasound FINDINGS:  Gallbladder: Sludge and small stones measuring up to 5 mm. Negative sonographic [place]. Normal wall thickness. Common bile duct: Diameter: 4 mm Liver: No focal lesion identified. Within normal limits in parenchymal echogenicity. Portal vein is patent on color Doppler imaging with normal direction of blood flow towards the liver. IVC: No abnormality visualized. Pancreas: Visualized portion unremarkable. Spleen: Size and appearance within normal limits. Right Kidney: Length: 8.5 cm. Cortical echogenicity normal. No hydronephrosis. Possible punctate 4 mm stone in the midpole. Left Kidney: Length: 9.1 cm. Cortical echogenicity normal. No hydronephrosis. Possible small 3 mm stone mid left kidney. Aorta: Difficult to visualize.  No aneurysm. Other findings: None. IMPRESSION: 1. Small stones and sludge within the gallbladder without biliary dilatation or other sonographic features to suggest acute cholecystitis 2. Possible small nonobstructing kidney stones Electronically Signed   By: Donavan Foil M.D.   On: 05/19/2018 18:25    Procedures Procedures (including critical care time)  Medications Ordered in ED Medications - No data to display   Initial Impression / Assessment and Plan / ED Course  I have reviewed the triage vital signs and the nursing notes.  Pertinent labs & imaging results that were available during my care of the patient were reviewed by me and considered in my medical decision making (see chart for details).   Patient presents to the ED with syncope, head/neck/back pain, and generalized weakness. Patient nontoxic appearing, resting comfortably, vitals WNL on arrival. Exam overall fairly benign without focal neuro deficit. Will further evaluate with labs and CTA head/neck/chest per discussion with supervising physician Dr. Ronnald Nian.   Labs w/ fairly baseline anemia. Renal function is poor at baseline slightly worse than usual, fluids running w/ contrast for CT imaging. EKG with new  prolonged QTc- no identifiable electrolyte or medication etiology, this is somewhat concerning in setting of syncope today. No STEMI, trop negative. Imaging without dissection/occlusion, no pulmonary embolism noted. Pending loop recorder interpretation, RN has requested this.   Patient reports this syncope was different from prior, she has extensive medical hx including CHF, w/ H&P & new prolonged QTc @ 552 feel patient warrants admission for observation. Findings and plan of care discussed with supervising physician Dr. Ronnald Nian who personally evaluated and examined this patient, provided guidance in care, and is in agreement.   18:30: CONSULT: Discussed case with hospitalist Dr.Patel who accepts admission.    Final Clinical Impressions(s) / ED Diagnoses   Final diagnoses:  Syncope, unspecified syncope type  Prolonged Q-T interval on ECG    ED Discharge Orders    None  Amaryllis Dyke, PA-C 05/21/18 1839    Lennice Sites, DO 05/23/18 [place], [place], DO 05/23/18 [id number]

## 2018-05-21 NOTE — ED Provider Notes (Signed)
Medical screening examination/treatment/procedure(s) were conducted as a shared visit with non-physician practitioner(s) and myself.  I personally evaluated the patient during the encounter. Briefly, the patient is a 72 y.o. female with history of paroxysmal atrial fibrillation on Eliquis, CKD, hypertension, high cholesterol, heart failure who presents the ED after syncopal event.  Patient with unremarkable vitals upon arrival.  No fever.  EKG shows sinus rhythm with prolonged QTC at 552.  Patient states that she was walking around [place] when she all of a sudden felt generally weak with upper back, neck pain, headache that appears to have caused her to pass out.  Family member at the bedside states that she was on the phone with the patient and it sounds as if she did pass out.  Did not appear to hit her head as people helped her at [facility].  She continues to have some upper back/neck pain.  Has been dealing with gallbladder issues recently.  Patient does have a loop recorder.  Patient with overall unremarkable exam.  Neurologically she appears intact.  Has strong pulses throughout.  Bilateral blood pressures normal.  Patient with syncopal event and given EKG with prolonged QTC concern for arrhythmia, concern for possible intracranial process, dissection.  Patient does have CKD so will evaluate with a CTA of the head, neck, chest.  She does not have any abdominal pain.  We will get basic labs including troponin.  Patient does appear clinically dehydrated.  Will give small saline bolus at 500 cc.  Patient needs CT scan with contrast despite known CKD, discussed risks and benefits with patient and wishes to proceed.  Also discussed need for imaging with Dr. Dorann Lodge, radiologist on call who is okay with imaging.  Troponin within normal limits.  Creatinine mildly bumped from her baseline to 2.37.  Otherwise electrolytes within normal limits.  Hemoglobin at baseline.  No significant leukocytosis.  Magnesium normal.   Patient with CTA of head neck and chest that was overall unremarkable.  No bleeding, no dissection, no PE.  Patient with overall unremarkable lab work.  Given high risk syncope will admit for further observation and telemetry.  Patient does have prolonged QTC and may need medications adjusted.  Hemodynamically stable throughout my care.  Loop recorder has been interrogated and awaiting report.  This chart was dictated using voice recognition software.  Despite best efforts to proofread,  errors can occur which can change the documentation meaning.    EKG Interpretation  Date/Time:  Wednesday May 21 2018 14:16:50 EST Ventricular Rate:  62 PR Interval:    QRS Duration: 102 QT Interval:  543 QTC Calculation: 552 R Axis:   -7 Text Interpretation:  Sinus rhythm RSR' in V1 or V2, right VCD or RVH Prolonged QT interval Confirmed by Lennice Sites [phone]) on 05/21/2018 3:00:55 PM           Lennice Sites, DO 05/21/18 [id number]

## 2018-05-21 NOTE — Telephone Encounter (Signed)
Patient stated Dr Dwyane Dee wanted to have the patient try the V-Go 20. She stated her sugars are doing fine on this and would like a prescription sent into the pharmacy She does not know how many she has left and would like to know if she could pick up another pack to last until she receives this from her Mail service     [place], [place]

## 2018-05-21 NOTE — Telephone Encounter (Signed)
Do you want this RX sent? Do you want to give her samples?

## 2018-05-21 NOTE — ED Notes (Addendum)
Patient transported to X-Ray 

## 2018-05-21 NOTE — Telephone Encounter (Signed)
Please prescribe the 20 unit V-go pump instead of 30, may have a sample if needed

## 2018-05-21 NOTE — ED Notes (Signed)
Patient transported to CT 

## 2018-05-21 NOTE — Telephone Encounter (Signed)
rx sent

## 2018-05-21 NOTE — ED Triage Notes (Signed)
Pt here via [facility], pt was at [facility], reports feeling generalized weakness and central thoracic stabbing back pain during checkout.  Pt sat on a chair and was pale/clammy, does not recall EMS arrival, pt A&O x 4 now. Stroke screen negative.

## 2018-05-22 ENCOUNTER — Encounter: Payer: Self-pay | Admitting: Internal Medicine

## 2018-05-22 ENCOUNTER — Telehealth: Payer: Self-pay | Admitting: Endocrinology

## 2018-05-22 DIAGNOSIS — I5032 Chronic diastolic (congestive) heart failure: Secondary | ICD-10-CM | POA: Diagnosis not present

## 2018-05-22 DIAGNOSIS — N179 Acute kidney failure, unspecified: Secondary | ICD-10-CM

## 2018-05-22 DIAGNOSIS — J45909 Unspecified asthma, uncomplicated: Secondary | ICD-10-CM

## 2018-05-22 DIAGNOSIS — E1122 Type 2 diabetes mellitus with diabetic chronic kidney disease: Secondary | ICD-10-CM | POA: Diagnosis not present

## 2018-05-22 DIAGNOSIS — R55 Syncope and collapse: Secondary | ICD-10-CM | POA: Diagnosis not present

## 2018-05-22 DIAGNOSIS — I951 Orthostatic hypotension: Secondary | ICD-10-CM

## 2018-05-22 DIAGNOSIS — N184 Chronic kidney disease, stage 4 (severe): Secondary | ICD-10-CM

## 2018-05-22 DIAGNOSIS — I130 Hypertensive heart and chronic kidney disease with heart failure and stage 1 through stage 4 chronic kidney disease, or unspecified chronic kidney disease: Secondary | ICD-10-CM | POA: Diagnosis not present

## 2018-05-22 DIAGNOSIS — I10 Essential (primary) hypertension: Secondary | ICD-10-CM

## 2018-05-22 DIAGNOSIS — E785 Hyperlipidemia, unspecified: Secondary | ICD-10-CM | POA: Diagnosis not present

## 2018-05-22 LAB — BASIC METABOLIC PANEL
Anion gap: 9 (ref 5–15)
BUN: 27 mg/dL — ABNORMAL HIGH (ref 8–23)
CO2: 27 mmol/L (ref 22–32)
Calcium: 8.8 mg/dL — ABNORMAL LOW (ref 8.9–10.3)
Chloride: 107 mmol/L (ref 98–111)
Creatinine, Ser: 2.17 mg/dL — ABNORMAL HIGH (ref 0.44–1.00)
GFR calc Af Amer: 26 mL/min — ABNORMAL LOW (ref >60)
GFR calc non Af Amer: 22 mL/min — ABNORMAL LOW (ref >60)
Glucose, Bld: 131 mg/dL — ABNORMAL HIGH (ref 70–99)
Potassium: 3.6 mmol/L (ref 3.5–5.1)
Sodium: 143 mmol/L (ref 135–145)

## 2018-05-22 LAB — CBC
HCT: 33.5 % — ABNORMAL LOW (ref 36.0–46.0)
Hemoglobin: 10.2 g/dL — ABNORMAL LOW (ref 12.0–15.0)
MCH: 30.2 pg (ref 26.0–34.0)
MCHC: 30.4 g/dL (ref 30.0–36.0)
MCV: 99.1 fL (ref 80.0–100.0)
Platelets: 104 10*3/uL — ABNORMAL LOW (ref 150–400)
RBC: 3.38 MIL/uL — ABNORMAL LOW (ref 3.87–5.11)
RDW: 13.3 % (ref 11.5–15.5)
WBC: 7 10*3/uL (ref 4.0–10.5)
nRBC: 0 % (ref 0.0–0.2)

## 2018-05-22 LAB — GLUCOSE, CAPILLARY: Glucose-Capillary: 95 mg/dL (ref 70–99)

## 2018-05-22 NOTE — Telephone Encounter (Signed)
Patient called regarding the change from [place] 30 to [place] 20.  Per Patient [facility] has not received this change and she is following up on this.    Please call [phone] to advise

## 2018-05-22 NOTE — Progress Notes (Signed)
Pt and husband given all discharge orders and questions were discussed with verbalization of all instructions by pt.  All belongings with pt and discharged home with husband.

## 2018-05-22 NOTE — Telephone Encounter (Signed)
This was sent 05/21/18

## 2018-05-22 NOTE — Discharge Summary (Signed)
Physician Discharge Summary  Hannah [name] FBX:[id number] DOB: 1945/09/10 DOA: 05/21/2018  PCP: Mackie Pai, PA-C  Admit date: 05/21/2018  Discharge date: 05/22/2018  Admitted From: Home  Disposition:  Home  Discharge Condition: Stable  CODE STATUS:  Full  Diet recommendation: Heart Healthy / Carb Modified  Brief/Interim Summary:  Patient is a 72 year old female with past medical history of chronic diastolic CHF, CAD status post CABG, status post bioprosthetic AVR, atrial fibrillation on Eliquis, insulin-dependent type 2 diabetes, CKD stage IV, carotid artery stenosis, and history of recurrent orthostasis/syncope presented to hospital with syncope. Patient was noted to be orthostatic and her syncope was likely from orthostatic hypotension.  Patient was extensively counseled regarding orthostatic precautions and wearing elastic compression stockings.  Patient insisted on being discharged home so she has been considered stable for disposition home at this time.  Patient also had mild AKI which improved with some volume replacement.  Patient's symptoms improved but that she was still orthostatic.  Patient did have some prolonged QTC and was on amiodarone but her QTC decreased on repeat EKG.  Patient however did not wish to stay in the hospital for 1 more day.  She was advised to be extremely careful and continue taking Florinef.  She was advised 1500 mL fluid per day to avoid dehydration and overhydration as well.  Patient will need to follow-up with her primary care physician after discharge.  I also spoke with the patient's family at bedside.  Discharge Diagnoses:  Principal Problem:   Syncope, likely orthostatic hypotension. Active Problems:   Essential hypertension   Asthma   Hyperlipidemia   Type 2 diabetes mellitus (HCC)   CKD (chronic kidney disease), stage IV (HCC)   Chronic diastolic CHF (congestive heart failure) (HCC)   AKI (acute kidney injury) (HCC)   Atrial  fibrillation (HCC)   Prolonged Q-T interval on ECG   Discharge Instructions  Discharge Instructions    Diet Carb Modified   Complete by:  As directed    Discharge instructions   Complete by:  As directed    Please follow-up with your primary care provider in 1 week.  Follow-up with your cardiologist as scheduled the first week of January.  Please take time to change positions.  Use elastic stockings.  Fluid goal 1500 mL's per day.   Increase activity slowly   Complete by:  As directed      Allergies as of 05/22/2018      Reactions   Amoxicillin Rash, Other (See Comments)   Has patient had a PCN reaction causing immediate rash, facial/tongue/throat swelling, SOB or lightheadedness with hypotension: No Has patient had a PCN reaction causing severe rash involving mucus membranes or skin necrosis: Yes Has patient had a PCN reaction that required hospitalization No Has patient had a PCN reaction occurring within the last 10 years: No If all of the above answers are "NO", then may proceed with Cephalosporin use.   Imdur [isosorbide Dinitrate] Other (See Comments)   Headache/severe hypotension/Syncope   Tape    Must use paper tape   Arimidex [anastrozole] Nausea Only   Latex Itching   Tetracycline Rash      Medication List    TAKE these medications   ACCU-CHEK GUIDE test strip Generic drug:  glucose blood Use to test blood sugar 3 times daily   ACCU-CHEK GUIDE test strip Generic drug:  glucose blood Use to test blood sugar 3 times daily   acetaminophen 325 MG tablet Commonly known as:  TYLENOL Take  2 tablets (650 mg total) by mouth every 6 (six) hours as needed for mild pain (or Fever >/= 101).   albuterol 108 (90 Base) MCG/ACT inhaler Commonly known as:  PROVENTIL HFA;VENTOLIN HFA Inhale 2 puffs into the lungs every 6 (six) hours as needed for wheezing or shortness of breath.   amiodarone 200 MG tablet Commonly known as:  PACERONE Take 200 mg by mouth daily.    apixaban 5 MG Tabs tablet Commonly known as:  ELIQUIS Take 1 tablet (5 mg total) by mouth 2 (two) times daily.   budesonide-formoterol 160-4.5 MCG/ACT inhaler Commonly known as:  SYMBICORT Inhale 2 puffs into the lungs 2 (two) times daily.   exemestane 25 MG tablet Commonly known as:  AROMASIN TAKE ONE TABLET BY MOUTH DAILY AFTER BREAKFAST What changed:    how much to take  how to take this  when to take this  additional instructions   ezetimibe 10 MG tablet Commonly known as:  ZETIA Take 1 tablet (10 mg total) by mouth daily.   fludrocortisone 0.1 MG tablet Commonly known as:  FLORINEF Take 1 tablet (0.1 mg total) by mouth daily.   furosemide 20 MG tablet Commonly known as:  LASIX Take 1 tablet (20 mg total) by mouth daily as needed for fluid.   HUMALOG KWIKPEN 100 UNIT/ML KwikPen Generic drug:  insulin lispro 2-6 Units at bedtime as needed (CBG >200).   insulin lispro 100 UNIT/ML injection Commonly known as:  HUMALOG INJECT 76 UNITS DAILY WITH  V-GO PUMP   metoprolol tartrate 25 MG tablet Commonly known as:  LOPRESSOR May take a total of 4 tablets in a 24 hour period. What changed:    how much to take  how to take this  when to take this   omeprazole 40 MG capsule Commonly known as:  PRILOSEC Take 1 capsule (40 mg total) by mouth daily.   rosuvastatin 40 MG tablet Commonly known as:  CRESTOR TAKE ONE TABLET BY MOUTH DAILY   sucralfate 1 g tablet Commonly known as:  CARAFATE Take 1 tablet (1 g total) by mouth 4 (four) times daily -  with meals and at bedtime.   TRULICITY 1.5 DX/4.1OI Sopn Generic drug:  Dulaglutide INJECT THE CONTENTS OF 1  PEN SUBCUTANEOUSLY WEEKLY  AS DIRECTED What changed:  See the new instructions.   V-GO 20 Kit 1 each by Does not apply route daily. What changed:    how much to take  when to take this   zolpidem 5 MG tablet Commonly known as:  AMBIEN Take 1 tablet (5 mg total) by mouth at bedtime as needed for  sleep.       Allergies  Allergen Reactions  . Amoxicillin Rash and Other (See Comments)    Has patient had a PCN reaction causing immediate rash, facial/tongue/throat swelling, SOB or lightheadedness with hypotension: No Has patient had a PCN reaction causing severe rash involving mucus membranes or skin necrosis: Yes Has patient had a PCN reaction that required hospitalization No Has patient had a PCN reaction occurring within the last 10 years: No If all of the above answers are "NO", then may proceed with Cephalosporin use.   . Imdur [Isosorbide Dinitrate] Other (See Comments)    Headache/severe hypotension/Syncope  . Tape     Must use paper tape  . Arimidex [Anastrozole] Nausea Only  . Latex Itching  . Tetracycline Rash    Consultations:  None   Procedures/Studies: Ct Angio Head W Or Wo Contrast  Result Date: 05/21/2018 CLINICAL DATA:  Generalized weakness and headache. EXAM: CT ANGIOGRAPHY HEAD AND NECK TECHNIQUE: Multidetector CT imaging of the head and neck was performed using the standard protocol during bolus administration of intravenous contrast. Multiplanar CT image reconstructions and MIPs were obtained to evaluate the vascular anatomy. Carotid stenosis measurements (when applicable) are obtained utilizing NASCET criteria, using the distal internal carotid diameter as the denominator. CONTRAST:  143m ISOVUE-370 IOPAMIDOL (ISOVUE-370) INJECTION 76% COMPARISON:  None. FINDINGS: CT HEAD FINDINGS Brain: There is no mass, hemorrhage or extra-axial collection. The size and configuration of the ventricles and extra-axial CSF spaces are normal. There is no acute or chronic infarction. The brain parenchyma is normal. Skull: The visualized skull base, calvarium and extracranial soft tissues are normal. Sinuses/Orbits: No fluid levels or advanced mucosal thickening of the visualized paranasal sinuses. No mastoid or middle ear effusion. The orbits are normal. CTA NECK FINDINGS  SKELETON: There is no bony spinal canal stenosis. No lytic or blastic lesion. OTHER NECK: Normal pharynx, larynx and major salivary glands. No cervical lymphadenopathy. Unremarkable thyroid gland. UPPER CHEST: No pneumothorax or pleural effusion. No nodules or masses. AORTIC ARCH: There is mild calcific atherosclerosis of the aortic arch. There is no aneurysm, dissection or hemodynamically significant stenosis of the visualized ascending aorta and aortic arch. Conventional 3 vessel aortic branching pattern. The visualized proximal subclavian arteries are widely patent. RIGHT CAROTID SYSTEM: --Common carotid artery: Widely patent origin without common carotid artery dissection or aneurysm. --Internal carotid artery: No dissection, occlusion or aneurysm. There is calcified atherosclerosis extending into the proximal ICA, resulting in 60% stenosis. --External carotid artery: No acute abnormality. LEFT CAROTID SYSTEM: --Common carotid artery: Widely patent origin without common carotid artery dissection or aneurysm. --Internal carotid artery: No dissection, occlusion or aneurysm. There is calcified atherosclerosis extending into the proximal ICA, resulting in less than 50% stenosis. --External carotid artery: No acute abnormality. VERTEBRAL ARTERIES: Codominant configuration. There is atherosclerotic calcification of both vertebral artery origins. There is atherosclerotic calcification of both V3 segments without flow limiting stenosis. No dissection. CTA HEAD FINDINGS ANTERIOR CIRCULATION: --Intracranial internal carotid arteries: Atherosclerotic calcification of the internal carotid arteries at the skull base without hemodynamically significant stenosis. --Anterior cerebral arteries: Normal. Both A1 segments are present. Patent anterior communicating artery. --Middle cerebral arteries: Normal. --Posterior communicating arteries: Present on the left, absent on the right. POSTERIOR CIRCULATION: --Basilar artery: Normal.  --Posterior cerebral arteries: Normal. --Superior cerebellar arteries: Normal. --Inferior cerebellar arteries: Normal anterior and posterior inferior cerebellar arteries. VENOUS SINUSES: As permitted by contrast timing, patent. ANATOMIC VARIANTS: None DELAYED PHASE: No parenchymal contrast enhancement. Review of the MIP images confirms the above findings. IMPRESSION: 1. No emergent large vessel occlusion or high-grade stenosis. 2. Approximately 60% stenosis of the proximal right internal carotid artery secondary to calcified atherosclerosis. 3.  Aortic atherosclerosis (ICD10-I70.0). 4. Bilateral atherosclerotic narrowing of the vertebral artery origins. Electronically Signed   By: KUlyses JarredM.D.   On: 05/21/2018 17:14   Dg Chest 2 View  Result Date: 05/21/2018 CLINICAL DATA:  Back pain. EXAM: CHEST - 2 VIEW COMPARISON:  Radiographs of February 04, 2018. FINDINGS: The heart size and mediastinal contours are within normal limits. Status post aortic valve replacement. No pneumothorax or pleural effusion is noted. Both lungs are clear. The visualized skeletal structures are unremarkable. IMPRESSION: No active cardiopulmonary disease. Electronically Signed   By: JMarijo Conception M.D.   On: 05/21/2018 15:44   Ct Angio Neck W And/or Wo Contrast  Result Date:  05/21/2018 CLINICAL DATA:  Generalized weakness and headache. EXAM: CT ANGIOGRAPHY HEAD AND NECK TECHNIQUE: Multidetector CT imaging of the head and neck was performed using the standard protocol during bolus administration of intravenous contrast. Multiplanar CT image reconstructions and MIPs were obtained to evaluate the vascular anatomy. Carotid stenosis measurements (when applicable) are obtained utilizing NASCET criteria, using the distal internal carotid diameter as the denominator. CONTRAST:  180m ISOVUE-370 IOPAMIDOL (ISOVUE-370) INJECTION 76% COMPARISON:  None. FINDINGS: CT HEAD FINDINGS Brain: There is no mass, hemorrhage or extra-axial  collection. The size and configuration of the ventricles and extra-axial CSF spaces are normal. There is no acute or chronic infarction. The brain parenchyma is normal. Skull: The visualized skull base, calvarium and extracranial soft tissues are normal. Sinuses/Orbits: No fluid levels or advanced mucosal thickening of the visualized paranasal sinuses. No mastoid or middle ear effusion. The orbits are normal. CTA NECK FINDINGS SKELETON: There is no bony spinal canal stenosis. No lytic or blastic lesion. OTHER NECK: Normal pharynx, larynx and major salivary glands. No cervical lymphadenopathy. Unremarkable thyroid gland. UPPER CHEST: No pneumothorax or pleural effusion. No nodules or masses. AORTIC ARCH: There is mild calcific atherosclerosis of the aortic arch. There is no aneurysm, dissection or hemodynamically significant stenosis of the visualized ascending aorta and aortic arch. Conventional 3 vessel aortic branching pattern. The visualized proximal subclavian arteries are widely patent. RIGHT CAROTID SYSTEM: --Common carotid artery: Widely patent origin without common carotid artery dissection or aneurysm. --Internal carotid artery: No dissection, occlusion or aneurysm. There is calcified atherosclerosis extending into the proximal ICA, resulting in 60% stenosis. --External carotid artery: No acute abnormality. LEFT CAROTID SYSTEM: --Common carotid artery: Widely patent origin without common carotid artery dissection or aneurysm. --Internal carotid artery: No dissection, occlusion or aneurysm. There is calcified atherosclerosis extending into the proximal ICA, resulting in less than 50% stenosis. --External carotid artery: No acute abnormality. VERTEBRAL ARTERIES: Codominant configuration. There is atherosclerotic calcification of both vertebral artery origins. There is atherosclerotic calcification of both V3 segments without flow limiting stenosis. No dissection. CTA HEAD FINDINGS ANTERIOR CIRCULATION:  --Intracranial internal carotid arteries: Atherosclerotic calcification of the internal carotid arteries at the skull base without hemodynamically significant stenosis. --Anterior cerebral arteries: Normal. Both A1 segments are present. Patent anterior communicating artery. --Middle cerebral arteries: Normal. --Posterior communicating arteries: Present on the left, absent on the right. POSTERIOR CIRCULATION: --Basilar artery: Normal. --Posterior cerebral arteries: Normal. --Superior cerebellar arteries: Normal. --Inferior cerebellar arteries: Normal anterior and posterior inferior cerebellar arteries. VENOUS SINUSES: As permitted by contrast timing, patent. ANATOMIC VARIANTS: None DELAYED PHASE: No parenchymal contrast enhancement. Review of the MIP images confirms the above findings. IMPRESSION: 1. No emergent large vessel occlusion or high-grade stenosis. 2. Approximately 60% stenosis of the proximal right internal carotid artery secondary to calcified atherosclerosis. 3.  Aortic atherosclerosis (ICD10-I70.0). 4. Bilateral atherosclerotic narrowing of the vertebral artery origins. Electronically Signed   By: KUlyses JarredM.D.   On: 05/21/2018 17:14   Ct Angio Chest Pe W And/or Wo Contrast  Result Date: 05/21/2018 CLINICAL DATA:  Chest pain. Back pain. EXAM: CT ANGIOGRAPHY CHEST WITH CONTRAST TECHNIQUE: Multidetector CT imaging of the chest was performed using the standard protocol during bolus administration of intravenous contrast. Multiplanar CT image reconstructions and MIPs were obtained to evaluate the vascular anatomy. CONTRAST:  1059mISOVUE-370 IOPAMIDOL (ISOVUE-370) INJECTION 76% COMPARISON:  Chest x-ray dated 05/21/2018 FINDINGS: Cardiovascular: Satisfactory opacification of the pulmonary arteries to the segmental level. No evidence of pulmonary embolism. Normal heart size. Extensive  aortic atherosclerosis and coronary artery calcifications. No pericardial effusion. Mediastinum/Nodes: No enlarged  mediastinal, hilar, or axillary lymph nodes. Thyroid gland, trachea, and esophagus demonstrate no significant findings. Lungs/Pleura: Lungs are clear. No pleural effusion or pneumothorax. Upper Abdomen: No acute abnormality. Musculoskeletal: No chest wall abnormality. No acute or significant osseous findings. There is a 4 cm cystic structure in the right breast consistent with a seroma from previous breast surgery. Markers are adjacent to the cyst. Review of the MIP images confirms the above findings. IMPRESSION: 1. No pulmonary emboli or other acute abnormalities. 2. Benign-appearing seroma in the right breast. Aortic Atherosclerosis (ICD10-I70.0). Electronically Signed   By: Lorriane Shire M.D.   On: 05/21/2018 17:15   US Abdomen Complete  Result Date: 05/19/2018 CLINICAL DATA:  Nausea dyspepsia right upper quadrant pain EXAM: ABDOMEN ULTRASOUND COMPLETE COMPARISON:  07/02/2017 renal ultrasound FINDINGS: Gallbladder: Sludge and small stones measuring up to 5 mm. Negative sonographic Murphy. Normal wall thickness. Common bile duct: Diameter: 4 mm Liver: No focal lesion identified. Within normal limits in parenchymal echogenicity. Portal vein is patent on color Doppler imaging with normal direction of blood flow towards the liver. IVC: No abnormality visualized. Pancreas: Visualized portion unremarkable. Spleen: Size and appearance within normal limits. Right Kidney: Length: 8.5 cm. Cortical echogenicity normal. No hydronephrosis. Possible punctate 4 mm stone in the midpole. Left Kidney: Length: 9.1 cm. Cortical echogenicity normal. No hydronephrosis. Possible small 3 mm stone mid left kidney. Aorta: Difficult to visualize.  No aneurysm. Other findings: None. IMPRESSION: 1. Small stones and sludge within the gallbladder without biliary dilatation or other sonographic features to suggest acute cholecystitis 2. Possible small nonobstructing kidney stones Electronically Signed   By: Donavan Foil M.D.   On:  05/19/2018 18:25      Subjective:  Patient feels better.  Denies any dizziness lightheadedness shortness of breath chest pain or palpitation.  She ambulated to the bathroom well without any issues.    Discharge Exam: Vitals:   05/22/18 0500 05/22/18 0859  BP: (!) 170/55   Pulse: 61   Resp: 16   Temp: 98.3 F (36.8 C)   SpO2: 97% 98%   Vitals:   05/21/18 2234 05/22/18 0300 05/22/18 0500 05/22/18 0859  BP:   (!) 170/55   Pulse:   61   Resp: 16  16   Temp: 98 F (36.7 C)  98.3 F (36.8 C)   TempSrc: Oral  Oral   SpO2: 100% 98% 97% 98%  Weight:      Height:        General: Pt is alert, awake, not in acute distress Cardiovascular: RRR, S1/S2 +, no rubs, no gallops Respiratory: CTA bilaterally, no wheezing, no rhonchi Abdominal: Soft, NT, ND, bowel sounds + CNS: non focal. Extremities: no edema, no cyanosis  The results of significant diagnostics from this hospitalization (including imaging, microbiology, ancillary and laboratory) are listed below for reference.    Microbiology: No results found for this or any previous visit (from the past 240 hour(s)).   Labs: BNP (last 3 results) Recent Labs    09/06/17 1530 02/04/18 0933  BNP 58.0 875.7*   Basic Metabolic Panel: Recent Labs  Lab 05/21/18 1418 05/21/18 1504 05/22/18 0311  NA 142  --  143  K 3.5  --  3.6  CL 106  --  107  CO2 23  --  27  GLUCOSE 143*  --  131*  BUN 29*  --  27*  CREATININE 2.37*  --  2.17*  CALCIUM 9.3  --  8.8*  MG  --  2.0  --    Liver Function Tests: Recent Labs  Lab 05/21/18 1530  AST 55*  ALT 49*  ALKPHOS 59  BILITOT 1.2  PROT 6.4*  ALBUMIN 3.1*   Recent Labs  Lab 05/21/18 1530  LIPASE 22   No results for input(s): AMMONIA in the last 168 hours. CBC: Recent Labs  Lab 05/21/18 1418 05/22/18 0311  WBC 8.2 7.0  HGB 10.8* 10.2*  HCT 36.3 33.5*  MCV 99.5 99.1  PLT 145* 104*   Cardiac Enzymes: No results for input(s): CKTOTAL, CKMB, CKMBINDEX, TROPONINI in  the last 168 hours. BNP: Invalid input(s): POCBNP CBG: Recent Labs  Lab 05/21/18 1954 05/22/18 0647  GLUCAP 92 95   D-Dimer No results for input(s): DDIMER in the last 72 hours. Hgb A1c No results for input(s): HGBA1C in the last 72 hours. Lipid Profile No results for input(s): CHOL, HDL, LDLCALC, TRIG, CHOLHDL, LDLDIRECT in the last 72 hours. Thyroid function studies No results for input(s): TSH, T4TOTAL, T3FREE, THYROIDAB in the last 72 hours.  Invalid input(s): FREET3 Anemia work up No results for input(s): VITAMINB12, FOLATE, FERRITIN, TIBC, IRON, RETICCTPCT in the last 72 hours. Urinalysis    Component Value Date/Time   COLORURINE YELLOW 05/21/2018 1736   APPEARANCEUR CLOUDY (A) 05/21/2018 1736   LABSPEC 1.023 05/21/2018 1736   LABSPEC 1.020 02/24/2016 1119   PHURINE 7.0 05/21/2018 1736   GLUCOSEU 50 (A) 05/21/2018 1736   GLUCOSEU Negative 02/24/2016 1119   HGBUR MODERATE (A) 05/21/2018 1736   BILIRUBINUR NEGATIVE 05/21/2018 1736   BILIRUBINUR Negative 02/24/2016 1119   KETONESUR NEGATIVE 05/21/2018 1736   PROTEINUR 30 (A) 05/21/2018 1736   UROBILINOGEN 0.2 02/24/2016 1119   NITRITE NEGATIVE 05/21/2018 1736   LEUKOCYTESUR LARGE (A) 05/21/2018 1736   LEUKOCYTESUR Trace 02/24/2016 1119   Sepsis Labs Invalid input(s): PROCALCITONIN,  WBC,  LACTICIDVEN Microbiology No results found for this or any previous visit (from the past 240 hour(s)).  Please note: You were cared for by a hospitalist during your hospital stay. Once you are discharged, your primary care physician will handle any further medical issues.   Time coordinating discharge: 40 minutes  SIGNED:  Flora Lipps, MD  [facility] 05/22/2018, 9:59 AM

## 2018-05-23 NOTE — Progress Notes (Signed)
[facility] Summary Report / Loop Recorder 

## 2018-05-28 ENCOUNTER — Ambulatory Visit (INDEPENDENT_AMBULATORY_CARE_PROVIDER_SITE_OTHER): Payer: Medicare Other | Admitting: Medical

## 2018-05-28 ENCOUNTER — Encounter: Payer: Self-pay | Admitting: Medical

## 2018-05-28 VITALS — BP 122/48 | HR 57 | Temp 98.1°F | Resp 16 | Ht 65.0 in | Wt 182.0 lb

## 2018-05-28 DIAGNOSIS — R55 Syncope and collapse: Secondary | ICD-10-CM | POA: Diagnosis not present

## 2018-05-28 DIAGNOSIS — I4891 Unspecified atrial fibrillation: Secondary | ICD-10-CM | POA: Diagnosis not present

## 2018-05-28 DIAGNOSIS — N179 Acute kidney failure, unspecified: Secondary | ICD-10-CM | POA: Diagnosis not present

## 2018-05-28 DIAGNOSIS — R1013 Epigastric pain: Secondary | ICD-10-CM

## 2018-05-28 NOTE — Patient Instructions (Addendum)
You report feeling well/stable since discharge from the hospital.  On review of discharge summary and hospitalization notes, it appears that cause of syncope was related to hypotension and dehydration.  Reminder to use compression stockings and continue the Florinef.  Make sure you adequately hydrate daily.  Keep the appointment with your cardiologist early January to evaluate the amiodarone dosing.  Currently on auscultation you are not in atrial fibrillation.  If any recurrent syncope episodes then be evaluated again in the ED.  Your epigastric pain has recently improved with diet changes, omeprazole and adding Carafate.  You have appointment with GI tomorrow and will see what additional medications/studies they might recommend.  You have a baseline decreased kidney function and on review numbers did improve during course of hospitalization/rehydration.  They did not recommend repeating studies on discharge summary.   Recommend a follow-up in 4 weeks or as needed.

## 2018-05-28 NOTE — Progress Notes (Signed)
Subjective:    Patient ID: Hannah [name], female    DOB: Sep 10, 1945, 72 y.o.   MRN: [id number]  HPI  Pt in for follow up.  She was admitted to hospital for work up syncope. Pt had syncope at [facility]. She got ha and felt weak. Then she passed out.   Summary from hospital discharge read   "Patient is a 72 year old female with past medical history of chronic diastolic CHF, CAD status post CABG, status post bioprosthetic AVR, atrial fibrillation on Eliquis, insulin-dependent type 2 diabetes, CKD stage IV, carotid artery stenosis, and history of recurrent orthostasis/syncope presented to hospital with syncope. Patient was noted to be orthostatic and her syncope was likely from orthostatic hypotension.  Patient was extensively counseled regarding orthostatic precautions and wearing elastic compression stockings.  Patient insisted on being discharged home so she has been considered stable for disposition home at this time.  Patient also had mild AKI which improved with some volume replacement.  Patient's symptoms improved but that she was still orthostatic.  Patient did have some prolonged QTC and was on amiodarone but her QTC decreased on repeat EKG.  Patient however did not wish to stay in the hospital for 1 more day.  She was advised to be extremely careful and continue taking Florinef.  She was advised 1500 mL fluid per day to avoid dehydration and overhydration as well.  Patient will need to follow-up with her primary care physician after discharge.  I also spoke with the patient's family at bedside."  Pt was told to wear compression stocking to help maintain good bp. Pt not wearing today since she left in hurry here. Since DC no headache, no weakness, no gross motor/sensory function deficits, no dizziness or syncope.   Pt thinks maybe was slight deyhdrated since she was decreasing food while trying to help her abdomen pain. She was eating smaller blander foods and maybe not hydrating as she should  have.  Pt has appointment with cardiologist on June 12, 2017. Pt is on amiodorone. Cardiologist has her on one tab a day. She is also on metoprolol. She continue with florinef.   For her abdomen pain she has GI consult. Some improved with carafate.   Review of Systems  Constitutional: Negative for chills, fatigue and fever.  Respiratory: Negative for chest tightness, shortness of breath and wheezing.   Cardiovascular: Negative for chest pain and palpitations.  Gastrointestinal: Negative for abdominal distention, abdominal pain, constipation, nausea and vomiting.       Recently stomach symptoms some better.  Genitourinary:       Hydrating and urinating normal.  Musculoskeletal: Negative for back pain.       No muscle cramps.  Neurological: Negative for dizziness and headaches.  Hematological: Negative for adenopathy. Does not bruise/bleed easily.  Psychiatric/Behavioral: Negative for behavioral problems and confusion.    Past Medical History:  Diagnosis Date  . Abnormally small mouth   . Acute on chronic combined systolic and diastolic congestive heart failure ([place]) 07/17/2013  . Adrenal insufficiency ([place]) 07/03/2016  . AKI (acute kidney injury) ([place]) 09/01/2017  . Allergic rhinitis 10/14/2009   Qualifier: Diagnosis of  By: Lamonte Sakai MD, Rose Fillers   Overview:  Overview:  Qualifier: Diagnosis of  By: Lamonte Sakai MD, Rose Fillers  Last Assessment & Plan:  Please continue Xyzal and Nasacort as you have been using them  . Anemia   . Asthma 05/12/2009   10/12/2014 p extensive coaching HFA effectiveness =    75% s spacer  Overview:  Overview:  10/12/2014 p extensive coaching HFA effectiveness =    75% s spacer   Last Assessment & Plan:  Please continue Symbicort 2 puffs twice a day. Remember to rinse and gargle after taking this medication. Take albuterol 2 puffs up to every 4 hours if needed for shortness of breath.  Follow with Dr Lamonte Sakai in 6 month  . Atrial tachycardia ([place]) 04/03/2013   Overview:  Last  Assessment & Plan:  I have discussed the likely benign nature of her problem. She has very minimal palpitations. I doubt atrial flutter. I would not anti-coagulate. Her monitor demonstrates that her episodes are short lived. Will follow.   . Bilateral carotid artery stenosis    Bilateral ICA 40-59%/  >50% LECA  . Breast cancer ([place]) 01/18/2016   right breast  . Breast cancer of upper-inner quadrant of right female breast ([place]) 12/16/2015  . CAD (coronary artery disease) cardiologist-  dr Aundra Dubin   Remote CABG in 1998 with redo in 2009  . Chemotherapy induced nausea and vomiting 02/24/2016  . Chemotherapy-induced peripheral neuropathy ([place]) 04/27/2016  . 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 CHF (congestive heart failure) (H[facility])    EF is low normal at 50 to 55% per echo in Jan. 2011  . Chronic coronary artery disease 01/24/2011   Overview:  Last Assessment & Plan:  Stable with no ischemic symptoms.  Continue ASA, statin, ACEI, beta blocker.   . Chronic diastolic CHF (congestive heart failure) ([place]) 09/24/2013  . CKD (chronic kidney disease), stage IV ([place]) 09/24/2013  . Closed fracture of head of left humerus 09/09/2017  . Essential hypertension 05/12/2009   Qualifier: Diagnosis of  By: Lamonte Sakai MD, Rose Fillers   . GERD (gastroesophageal reflux disease)   . H/O atrial tachycardia 05/12/2009   Qualifier: History of  By: Lamonte Sakai MD, Rose Fillers   Overview:  Overview:  Qualifier: History of  By: Lamonte Sakai MD, Rose Fillers  Last Assessment & Plan:  There was no evidence of this on her cardiac monitor. I would recommend watchful waiting.   Marland Kitchen Hearing loss of right ear   . History of aortic valve replacement 10/27/2010   Overview:  Last Assessment & Plan:  Will get echo to reassess bioprosthetic aortic valve.   Marland Kitchen History of non-ST elevation myocardial infarction (NSTEMI)    Sept 2014--  thought to be type II HTN w/ LHC without infarct related artery and patent grafts  . History of radiation therapy  05/24/16-07/26/16   right breast 50.4 Gy in 28 fractions, right breast boost 10 Gy in 5 fractions  . Hyperlipidemia   . Hypertension    Resolved.  . Iron deficiency anemia   . Mania ([place]) 03/05/2016  . Moderate persistent asthma    pulmologist-  Dr. Malvin Johns  . Nonischemic cardiomyopathy ([place])   . Osteomyelitis of toe of left foot ([place]) 04/06/2016  . Osteopenia of multiple sites 10/19/2015  . PAF (paroxysmal atrial fibrillation) ([place])   . Personal history of chemotherapy 2017  . Personal history of radiation therapy 2017  . Port catheter in place 02/17/2016  . Psoriasis    right leg  . Renal calculus, right   . Renal insufficiency, mild   . S/P AVR    prosthesis valve placement 2009 at same time re-do CABG  . Seizure ([place]) 06/29/2016  . Seizure-like activity ([place]) 09/01/2017  . Sensorineural hearing loss (SNHL) of both ears 01/06/2016  . Stroke [facility])    residual rt hearing loss  . Syncopal  episodes 05/21/2018  . Syncope 09/06/2017  . Type 2 diabetes mellitus ([place])    monitored by dr Dwyane Dee     Social History   Socioeconomic History  . Marital status: Married    Spouse name: Not on file  . Number of children: 2  . Years of education: Not on file  . Highest education level: Not on file  Occupational History  . Occupation: retired  Scientific laboratory technician  . Financial resource strain: Not on file  . Food insecurity:    Worry: Not on file    Inability: Not on file  . Transportation needs:    Medical: Not on file    Non-medical: Not on file  Tobacco Use  . Smoking status: Never Smoker  . Smokeless tobacco: Never Used  Substance and Sexual Activity  . Alcohol use: No  . Drug use: No  . Sexual activity: Yes    Birth control/protection: None  Lifestyle  . Physical activity:    Days per week: Not on file    Minutes per session: Not on file  . Stress: Not on file  Relationships  . Social connections:    Talks on phone: Not on file    Gets together: Not on file    Attends religious  service: Not on file    Active member of club or organization: Not on file    Attends meetings of clubs or organizations: Not on file    Relationship status: Not on file  . Intimate partner violence:    Fear of current or ex partner: Not on file    Emotionally abused: Not on file    Physically abused: Not on file    Forced sexual activity: Not on file  Other Topics Concern  . Not on file  Social History Narrative  . Not on file    Past Surgical History:  Procedure Laterality Date  . AORTIC VALVE REPLACEMENT  2009   [facility]done same time is CABG)  . BREAST LUMPECTOMY Right 01/18/2016  . BREAST LUMPECTOMY WITH RADIOACTIVE SEED AND SENTINEL LYMPH NODE BIOPSY Right 01/18/2016   Procedure: RIGHT BREAST LUMPECTOMY WITH RADIOACTIVE SEED AND SENTINEL LYMPH NODE BIOPSY;  Surgeon: DAlphonsa Overall MD;  [facility]  Service: General;  Laterality: Right;  . CARDIAC CATHETERIZATION  03/23/2008   Pre-redo CABG: L main OK, LAD (T), CFX (T), OM1 99%, RCA (T), LIMA-LAD OK, SVG-OM(?3) OK w/ little florw to OM2, SVG-RCA OK. EF NL  . CARPAL TUNNEL RELEASE    . COLONOSCOPY    . CORONARY ARTERY BYPASS GRAFT  1998 &  re-do 2009   Had LIMA to DX/LAD, SVG to 2 marginal [facility] SVG to 3rd OM and PD at time of redo  . CYSTOSCOPY W/ URETERAL STENT PLACEMENT Right 12/20/2014   Procedure: CYSTOSCOPY WITH RETROGRADE PYELOGRAM/URETERAL STENT PLACEMENT;  Surgeon: PCleon Gustin MD;  [facility]  Service: Urology;  Laterality: Right;  . EYE SURGERY Bilateral    cataracts  . HOLMIUM LASER APPLICATION Right 79/16/3846  Procedure:  HOLMIUM LASER LITHOTRIPSY;  Surgeon: PCleon Gustin MD;  [facility]  Service: Urology;  Laterality: Right;  . LEFT HEART CATHETERIZATION WITH CORONARY/GRAFT ANGIOGRAM N/A 02/23/2013   Procedure: LEFT HEART CATHETERIZATION WITH CBeatrix Fetters  Surgeon:  MBlane Ohara MD;  [facility];  Service: Cardiovascular;  Laterality: N/A;  . LOOP RECORDER INSERTION N/A 08/30/2017  Procedure: LOOP RECORDER INSERTION;  Surgeon: Evans Lance, MD;  Location: [place] CV LAB;  Service: Cardiovascular;  Laterality: N/A;  . PORTACATH PLACEMENT Left 01/18/2016   Procedure: INSERTION PORT-A-CATH;  Surgeon: Alphonsa Overall, MD;  Location: [place];  Service: General;  Laterality: Left;  . portacath removal    . TONSILLECTOMY    . TRANSTHORACIC ECHOCARDIOGRAM  02-24-2013      mild LVH,  ef 50-55%/  AV bioprosthesis was present with very mild stenosis and no regurg., mean grandient 44mHg, peak grandient 262mg /  mild MR/  mild LAE and RAE/  moderate TR  . TUBAL LIGATION      Family History  Problem Relation Age of Onset  . Heart disease Father   . Heart failure Father   . Diabetes Maternal Grandmother   . Heart disease Maternal Grandmother   . Diabetes Son   . Healthy Brother        #1  . Heart attack Brother        #2  . Heart disease Brother        #2    Allergies  Allergen Reactions  . Amoxicillin Rash and Other (See Comments)    Has patient had a PCN reaction causing immediate rash, facial/tongue/throat swelling, SOB or lightheadedness with hypotension: No Has patient had a PCN reaction causing severe rash involving mucus membranes or skin necrosis: Yes Has patient had a PCN reaction that required hospitalization No Has patient had a PCN reaction occurring within the last 10 years: No If all of the above answers are "NO", then may proceed with Cephalosporin use.   . Imdur [Isosorbide Dinitrate] Other (See Comments)    Headache/severe hypotension/Syncope  . Tape     Must use paper tape  . Arimidex [Anastrozole] Nausea Only  . Latex Itching  . Tetracycline Rash    Current Outpatient Medications on File Prior to Visit  Medication Sig Dispense Refill  . ACCU-CHEK GUIDE test strip Use to test blood sugar 3 times daily  5  .  ACCU-CHEK GUIDE test strip Use to test blood sugar 3 times daily 100 each 3  . acetaminophen (TYLENOL) 325 MG tablet Take 2 tablets (650 mg total) by mouth every 6 (six) hours as needed for mild pain (or Fever >/= 101).    . Marland Kitchenlbuterol (PROVENTIL HFA;VENTOLIN HFA) 108 (90 Base) MCG/ACT inhaler Inhale 2 puffs into the lungs every 6 (six) hours as needed for wheezing or shortness of breath.    . Marland Kitchenmiodarone (PACERONE) 200 MG tablet Take 200 mg by mouth daily.    . Marland Kitchenpixaban (ELIQUIS) 5 MG TABS tablet Take 1 tablet (5 mg total) by mouth 2 (two) times daily. 180 tablet 3  . budesonide-formoterol (SYMBICORT) 160-4.5 MCG/ACT inhaler Inhale 2 puffs into the lungs 2 (two) times daily. 10.2 g 2  . exemestane (AROMASIN) 25 MG tablet TAKE ONE TABLET BY MOUTH DAILY AFTER BREAKFAST (Patient taking differently: Take 25 mg by mouth daily after breakfast. ) 90 tablet 3  . ezetimibe (ZETIA) 10 MG tablet Take 1 tablet (10 mg total) by mouth daily. 90 tablet 3  . fludrocortisone (FLORINEF) 0.1 MG tablet Take 1 tablet (0.1 mg total) by mouth daily. 90 tablet 3  . furosemide (LASIX) 20 MG tablet Take 1 tablet (20 mg total) by mouth daily as needed for fluid. 30 tablet 0  . Insulin Disposable Pump (V-GO 20) KIT 1 each by Does not apply route daily. 3 kit 3  . insulin lispro (  HUMALOG KWIKPEN) 100 UNIT/ML KiwkPen 2-6 Units at bedtime as needed (CBG >200).     . insulin lispro (HUMALOG) 100 UNIT/ML injection INJECT 76 UNITS DAILY WITH  V-GO PUMP 70 mL 3  . metoprolol tartrate (LOPRESSOR) 25 MG tablet May take a total of 4 tablets in a 24 hour period. (Patient taking differently: Take 25 mg by mouth daily. May take a total of 4 tablets in a 24 hour period.) 30 tablet 3  . omeprazole (PRILOSEC) 40 MG capsule Take 1 capsule (40 mg total) by mouth daily. 30 capsule 3  . rosuvastatin (CRESTOR) 40 MG tablet TAKE ONE TABLET BY MOUTH DAILY (Patient taking differently: Take 40 mg by mouth daily. ) 90 tablet 3  . sucralfate (CARAFATE) 1  g tablet Take 1 tablet (1 g total) by mouth 4 (four) times daily -  with meals and at bedtime. 161 tablet 0  . TRULICITY 1.5 WR/6.0AV SOPN INJECT THE CONTENTS OF 1  PEN SUBCUTANEOUSLY WEEKLY  AS DIRECTED (Patient taking differently: Inject 1 pen into the skin every Sunday. ) 6 mL 2  . zolpidem (AMBIEN) 5 MG tablet Take 1 tablet (5 mg total) by mouth at bedtime as needed for sleep. 30 tablet 2   No current facility-administered medications on file prior to visit.     BP (!) 122/48 (BP Location: Left Arm, Patient Position: Sitting, Cuff Size: Small)   Pulse (!) 57   Temp 98.1 F (36.7 C) (Oral)   Resp 16   Ht 5' 5"  (1.651 m)   Wt 182 lb (82.6 kg)   SpO2 100%   BMI 30.29 kg/m       Objective:   Physical Exam  General Mental Status- Alert. General Appearance- Not in acute distress.   Skin General: Color- Normal Color. Moisture- Normal Moisture.  Neck Carotid Arteries- Normal color. Moisture- Normal Moisture. No carotid bruits. No JVD.  Chest and Lung Exam Auscultation: Breath Sounds:-Normal.  Cardiovascular Auscultation:Rythm- Regular. Murmurs & Other Heart Sounds:Auscultation of the heart reveals- No Murmurs.  Abdomen Inspection:-Inspeection Normal. Palpation/Percussion:Note:No mass. Palpation and Percussion of the abdomen reveal- Non Tender, Non Distended + BS, no rebound or guarding.    Neurologic Cranial Nerve exam:- CN III-XII intact(No nystagmus), symmetric smile. Strength:- 5/5 equal and symmetric strength both upper and lower extremities.   Lower ext- no pedal edema. Negative homans signs bilaterally.     Assessment & Plan:  You report feeling well/stable since discharge from the hospital.  On review of discharge summary and hospitalization notes, it appears that cause of syncope was related to hypotension and dehydration.  Reminder to use compression stockings and continue the Florinef.  Make sure you adequately hydrate daily.  Keep the appointment with  your cardiologist early January to evaluate the amiodarone dosing.  Currently on auscultation you are not in atrial fibrillation.  If any recurrent syncope episodes then be evaluated again in the ED.  Your epigastric pain has recently improved with diet changes, omeprazole and adding Carafate.  You have appointment with GI tomorrow and will see what additional medications/studies they might recommend.  You have a baseline decreased kidney function and on review numbers did improve during course of hospitalization/rehydration.  They did not recommend repeating studies on discharge summary.   Recommend a follow-up in 4 weeks or as needed.  Mackie Pai, PA-C

## 2018-05-29 ENCOUNTER — Encounter: Payer: Self-pay | Admitting: Gastroenterology

## 2018-05-29 ENCOUNTER — Ambulatory Visit (INDEPENDENT_AMBULATORY_CARE_PROVIDER_SITE_OTHER): Payer: Medicare Other | Admitting: Gastroenterology

## 2018-05-29 VITALS — BP 134/60 | HR 67 | Ht 65.0 in | Wt 183.1 lb

## 2018-05-29 DIAGNOSIS — R748 Abnormal levels of other serum enzymes: Secondary | ICD-10-CM | POA: Diagnosis not present

## 2018-05-29 DIAGNOSIS — R110 Nausea: Secondary | ICD-10-CM

## 2018-05-29 DIAGNOSIS — R1013 Epigastric pain: Secondary | ICD-10-CM | POA: Diagnosis not present

## 2018-05-29 MED ORDER — PANTOPRAZOLE SODIUM 40 MG PO TBEC: 40.0000 mg | DELAYED_RELEASE_TABLET | Freq: Every day | ORAL | 11 refills | Status: DC

## 2018-05-29 NOTE — Progress Notes (Signed)
Chief Complaint: abdo pain  Referring Provider:  Mackie Pai, PA-C      ASSESSMENT AND PLAN;   #1. Epigastric pain with nausea. US showing small gallstones without cholecystitis or IHBR dilatation. R/O gastric etiology. No pancreatitis.  #2.  Abn LFTs s/o hepatocellular pattern. Likely d/t amiodarone.  Low platelet count is concerning.  No liver cirrhosis at this time on [place] 05/2018 but patient at risk.  #3. Anemia of chronic disease d/t CRI.  No GI bleeding. Last colon 06/2012-negative. Rpt in 10 yrs.  #4.  Multiple comorbid conditions - dCHF (EF- 60-65%), CAD s/p CABG with bioprosthetic AVR, A Fib on Eliquis, insulin-dependent DM2, CKD4, carotid artery stenosis, and history of recurrent orthostasis/syncope.   Plan; - Change omeprazole to Protonix 40 mg p.o. once a day. - Decrease carafate to bid d/t CRI. - EGD off elquis after cardio clearence from Dr Lovena Le. - CTA abdo/pelvis (patient with history of significant vasculopathy). - Amiodarone has been reduced by Dr. Lovena Le.  Patient has appt with Marya Amsler in January 2020.  She will discuss regarding amiodarone with him. Pt not keen to get off amiodarone since it has helped a lot.  She does understand hepatic toxicity. - If still with problems, consider surgical consultation for possible lap chole.  HPI:    Hannah [name] is a 72 y.o. female  With H/O dCHF (EF- 60%), CAD s/p CABG with bioprosthetic AVR, A Fib on Eliquis, insulin-dependent DM2, CKD4, carotid artery stenosis, and history of recurrent orthostasis/syncope presented to hospital with syncope.  Epigastric pain x 2 months, mostly after eating especially fatty foods. Describes this as more or less constant, occasionally radiating to the back Has associated nausea lasting for 2 to 3 hours after eating. Some heartburn and belching. Underwent ultrasound of the abdomen which showed small gallstones without any cholecystitis. No jaundice dark urine or pale stools. Sent to the  GI clinic for further evaluation. No dysphagia or odynophagia.  On review of her medications-she has been on amiodarone for over a year, dose has been reduced to 200 mg once a day from twice daily due to abnormal liver function tests.  She has appointment with Dr. Lovena Le in January.  Also had prolonged QT interval.  She has been on Trulicity for over 2 years-did not have any problems with nausea or vomiting.  She has been on Zetia for several years.  Given Carafate with some relief.  Never had EGD performed.  Has history of longstanding chronic anemia.  No melena or hematochezia.  Last colonoscopy in 2014 which was negative.  Repeat in 10 years per patient.  Past Medical History:  Diagnosis Date  . Abnormally small mouth   . Acute on chronic combined systolic and diastolic congestive heart failure ([place]) 07/17/2013  . Adrenal insufficiency ([place]) 07/03/2016  . AKI (acute kidney injury) ([place]) 09/01/2017  . Allergic rhinitis 10/14/2009   Qualifier: Diagnosis of  By: Lamonte Sakai MD, Rose Fillers   Overview:  Overview:  Qualifier: Diagnosis of  By: Lamonte Sakai MD, Rose Fillers  Last Assessment & Plan:  Please continue Xyzal and Nasacort as you have been using them  . Anemia   . Asthma 05/12/2009   10/12/2014 p extensive coaching HFA effectiveness =    75% s spacer    Overview:  Overview:  10/12/2014 p extensive coaching HFA effectiveness =    75% s spacer   Last Assessment & Plan:  Please continue Symbicort 2 puffs twice a day. Remember to rinse and gargle  after taking this medication. Take albuterol 2 puffs up to every 4 hours if needed for shortness of breath.  Follow with Dr Lamonte Sakai in 6 month  . Atrial tachycardia ([place]) 04/03/2013   Overview:  Last Assessment & Plan:  I have discussed the likely benign nature of her problem. She has very minimal palpitations. I doubt atrial flutter. I would not anti-coagulate. Her monitor demonstrates that her episodes are short lived. Will follow.   . Bilateral carotid artery stenosis     Bilateral ICA 40-59%/  >50% LECA  . Breast cancer ([place]) 01/18/2016   right breast  . Breast cancer of upper-inner quadrant of right female breast ([place]) 12/16/2015  . CAD (coronary artery disease) cardiologist-  dr Aundra Dubin   Remote CABG in 1998 with redo in 2009  . Chemotherapy induced nausea and vomiting 02/24/2016  . 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 CHF (congestive heart failure) (H[facility])    EF is low normal at 50 to 55% per echo in Jan. 2011  . Chronic coronary artery disease 01/24/2011   Overview:  Last Assessment & Plan:  Stable with no ischemic symptoms.  Continue ASA, statin, ACEI, beta blocker.   . Chronic diastolic CHF (congestive heart failure) ([place]) 09/24/2013  . CKD (chronic kidney disease), stage IV ([place]) 09/24/2013  . Closed fracture of head of left humerus 09/09/2017  . Essential hypertension 05/12/2009   Qualifier: Diagnosis of  By: Lamonte Sakai MD, Rose Fillers   . Gallstones   . GERD (gastroesophageal reflux disease)   . Glaucoma   . H/O atrial tachycardia 05/12/2009   Qualifier: History of  By: Lamonte Sakai MD, Rose Fillers   Overview:  Overview:  Qualifier: History of  By: Lamonte Sakai MD, Rose Fillers  Last Assessment & Plan:  There was no evidence of this on her cardiac monitor. I would recommend watchful waiting.   Marland Kitchen Hearing loss of right ear   . History of aortic valve replacement 10/27/2010   Overview:  Last Assessment & Plan:  Will get echo to reassess bioprosthetic aortic valve.   Marland Kitchen History of non-ST elevation myocardial infarction (NSTEMI)    Sept 2014--  thought to be type II HTN w/ LHC without infarct related artery and patent grafts  . History of radiation therapy 05/24/16-07/26/16   right breast 50.4 Gy in 28 fractions, right breast boost 10 Gy in 5 fractions  . Hyperlipidemia   . Hypertension    Resolved.  . Iron deficiency anemia   . Mania ([place]) 03/05/2016  . Moderate persistent asthma    pulmologist-  Dr. Malvin Johns  .  Nonischemic cardiomyopathy ([place])   . Osteomyelitis of toe of left foot ([place]) 04/06/2016  . Osteopenia of multiple sites 10/19/2015  . PAF (paroxysmal atrial fibrillation) ([place])   . Personal history of chemotherapy 2017  . Personal history of radiation therapy 2017  . Port catheter in place 02/17/2016  . Psoriasis    right leg  . Renal calculus, right   . Renal insufficiency, mild   . S/P AVR    prosthesis valve placement 2009 at same time re-do CABG  . Seizure ([place]) 06/29/2016  . Seizure-like activity ([place]) 09/01/2017  . Sensorineural hearing loss (SNHL) of both ears 01/06/2016  . Stroke [facility])    residual rt hearing loss  . Syncopal episodes 05/21/2018  . Syncope 09/06/2017  . Type 2 diabetes mellitus ([place])    monitored by dr Dwyane Dee    Past Surgical History:  Procedure Laterality  Date  . AORTIC VALVE REPLACEMENT  2009   [facility]done same time is CABG)  . BREAST LUMPECTOMY Right 01/18/2016  . BREAST LUMPECTOMY WITH RADIOACTIVE SEED AND SENTINEL LYMPH NODE BIOPSY Right 01/18/2016   Procedure: RIGHT BREAST LUMPECTOMY WITH RADIOACTIVE SEED AND SENTINEL LYMPH NODE BIOPSY;  Surgeon: DAlphonsa Overall MD;  [place]  Service: General;  Laterality: Right;  . CARDIAC CATHETERIZATION  03/23/2008   Pre-redo CABG: L main OK, LAD (T), CFX (T), OM1 99%, RCA (T), LIMA-LAD OK, SVG-OM(?3) OK w/ little florw to OM2, SVG-RCA OK. EF NL  . CARPAL TUNNEL RELEASE    . COLONOSCOPY     around 2015. Possibly with [facility]   . CORONARY ARTERY BYPASS GRAFT  1998 &  re-do 2009   Had LIMA to DX/LAD, [facility] 2 [facility] SVG to 3rd OM and PD at time of redo  . CYSTOSCOPY W/ URETERAL STENT PLACEMENT Right 12/20/2014   Procedure: CYSTOSCOPY WITH RETROGRADE PYELOGRAM/URETERAL STENT PLACEMENT;  Surgeon: PCleon Gustin MD;  [facility]  Service: Urology;  Laterality: Right;  . ESOPHAGOGASTRODUODENOSCOPY     many years ago  per patient   . EYE SURGERY Bilateral    cataracts  . HOLMIUM LASER APPLICATION Right 75/27/7824  Procedure:  HOLMIUM LASER LITHOTRIPSY;  Surgeon: PCleon Gustin MD;  [facility]  Service: Urology;  Laterality: Right;  . LEFT HEART CATHETERIZATION WITH CORONARY/GRAFT ANGIOGRAM N/A 02/23/2013   Procedure: LEFT HEART CATHETERIZATION WITH CBeatrix Fetters  Surgeon: MBlane Ohara MD;  [facility];  Service: Cardiovascular;  Laterality: N/A;  . LOOP RECORDER INSERTION N/A 08/30/2017   Procedure: LOOP RECORDER INSERTION;  Surgeon: TEvans Lance MD;  [place] [facility];  Service: Cardiovascular;  Laterality: N/A;  . PORTACATH PLACEMENT Left 01/18/2016   Procedure: INSERTION PORT-A-CATH;  Surgeon: DAlphonsa Overall MD;  [place]  Service: General;  Laterality: Left;  . portacath removal    . TONSILLECTOMY    . TRANSTHORACIC ECHOCARDIOGRAM  02-24-2013      mild LVH,  ef 50-55%/  AV bioprosthesis was present with very mild stenosis and no regurg., mean grandient 13mg, peak grandient 2069m /  mild MR/  mild LAE and RAE/  moderate TR  . TUBAL LIGATION      Family History  Problem Relation Age of Onset  . Heart disease Father   . Heart failure Father   . Diabetes Maternal Grandmother   . Heart disease Maternal Grandmother   . Diabetes Son   . Healthy Brother        #1  . Heart attack Brother        #2  . Heart disease Brother        #2  . Colon cancer Neg Hx   . Esophageal cancer Neg Hx     Social History   Tobacco Use  . Smoking status: Never Smoker  . Smokeless tobacco: Never Used  Substance Use Topics  . Alcohol use: No  . Drug use: No    Current Outpatient Medications  Medication Sig Dispense Refill  . ACCU-CHEK GUIDE test strip Use to test blood sugar 3 times daily 100 each 3  . acetaminophen (TYLENOL) 325 MG tablet Take 2 tablets (650 mg total) by mouth every 6 (six) hours as needed for mild pain (or Fever  >/= 101).    . aMarland Kitchenbuterol (PROVENTIL  HFA;VENTOLIN HFA) 108 (90 Base) MCG/ACT inhaler Inhale 2 puffs into the lungs every 6 (six) hours as needed for wheezing or shortness of breath.    Marland Kitchen amiodarone (PACERONE) 200 MG tablet Take 200 mg by mouth daily.    Marland Kitchen apixaban (ELIQUIS) 5 MG TABS tablet Take 1 tablet (5 mg total) by mouth 2 (two) times daily. 180 tablet 3  . budesonide-formoterol (SYMBICORT) 160-4.5 MCG/ACT inhaler Inhale 2 puffs into the lungs 2 (two) times daily. 10.2 g 2  . exemestane (AROMASIN) 25 MG tablet TAKE ONE TABLET BY MOUTH DAILY AFTER BREAKFAST (Patient taking differently: Take 25 mg by mouth daily after breakfast. ) 90 tablet 3  . ezetimibe (ZETIA) 10 MG tablet Take 1 tablet (10 mg total) by mouth daily. 90 tablet 3  . fludrocortisone (FLORINEF) 0.1 MG tablet Take 1 tablet (0.1 mg total) by mouth daily. 90 tablet 3  . furosemide (LASIX) 20 MG tablet Take 1 tablet (20 mg total) by mouth daily as needed for fluid. 30 tablet 0  . Insulin Disposable Pump (V-GO 20) KIT 1 each by Does not apply route daily. 3 kit 3  . insulin lispro (HUMALOG KWIKPEN) 100 UNIT/ML KiwkPen 2-6 Units at bedtime as needed (CBG >200).     . insulin lispro (HUMALOG) 100 UNIT/ML injection INJECT 76 UNITS DAILY WITH  V-GO PUMP 70 mL 3  . metoprolol tartrate (LOPRESSOR) 25 MG tablet May take a total of 4 tablets in a 24 hour period. (Patient taking differently: Take 25 mg by mouth daily. May take a total of 4 tablets in a 24 hour period.) 30 tablet 3  . omeprazole (PRILOSEC) 40 MG capsule Take 1 capsule (40 mg total) by mouth daily. 30 capsule 3  . rosuvastatin (CRESTOR) 40 MG tablet TAKE ONE TABLET BY MOUTH DAILY (Patient taking differently: Take 40 mg by mouth daily. ) 90 tablet 3  . sucralfate (CARAFATE) 1 g tablet Take 1 tablet (1 g total) by mouth 4 (four) times daily -  with meals and at bedtime. 160 tablet 0  . TRULICITY 1.5 VP/7.1GG SOPN INJECT THE CONTENTS OF 1  PEN SUBCUTANEOUSLY WEEKLY  AS DIRECTED  (Patient taking differently: Inject 1 pen into the skin every Sunday. ) 6 mL 2  . zolpidem (AMBIEN) 5 MG tablet Take 1 tablet (5 mg total) by mouth at bedtime as needed for sleep. 30 tablet 2   No current facility-administered medications for this visit.     Allergies  Allergen Reactions  . Amoxicillin Rash and Other (See Comments)    Has patient had a PCN reaction causing immediate rash, facial/tongue/throat swelling, SOB or lightheadedness with hypotension: No Has patient had a PCN reaction causing severe rash involving mucus membranes or skin necrosis: Yes Has patient had a PCN reaction that required hospitalization No Has patient had a PCN reaction occurring within the last 10 years: No If all of the above answers are "NO", then may proceed with Cephalosporin use.   . Imdur [Isosorbide Dinitrate] Other (See Comments)    Headache/severe hypotension/Syncope  . Tape     Must use paper tape  . Arimidex [Anastrozole] Nausea Only  . Latex Itching  . Tetracycline Rash    Review of Systems:  Constitutional: Denies fever, chills, diaphoresis, has appetite change and fatigue.  HEENT: Has allergies and sinus problem., ear pain, congestion, sore throat, rhinorrhea, sneezing, mouth sores, neck pain, neck stiffness and tinnitus.   Respiratory: Denies SOB, DOE, cough, chest tightness,  and wheezing.  Cardiovascular: Denies chest pain, palpitations and leg swelling.  Genitourinary: Denies dysuria, urgency, frequency, hematuria, flank pain and difficulty urinating.  Musculoskeletal: Has myalgias, back pain, joint swelling, arthralgias and gait problem.  Skin: No rash.  Neurological: Denies dizziness, seizures, syncope, weakness, light-headedness, numbness and headaches.  Hematological: Denies adenopathy. Easy bruising, personal or family bleeding history  Psychiatric/Behavioral: Has anxiety or depression     Physical Exam:    BP 134/60   Pulse 67   Ht 5' 5"  (1.651 m)   Wt 183 lb 2 oz  (83.1 kg)   BMI 30.47 kg/m  Filed Weights   05/29/18 0845  Weight: 183 lb 2 oz (83.1 kg)   Constitutional:  Well-developed, in no acute distress. Psychiatric: Normal mood and affect. Behavior is normal. HEENT: Pupils normal.  Conjunctivae are normal. No scleral icterus. Neck supple.  Cardiovascular: Normal rate, regular rhythm. No edema.  Ejection systolic murmur at aortic area and mitral area. Pulmonary/chest: Effort normal and breath sounds normal. No wheezing, rales or rhonchi. Abdominal: Soft, nondistended.  Mild epigastric tenderness bowel sounds active throughout. There are no masses palpable. No hepatomegaly. Rectal:  defered Neurological: Alert and oriented to person place and time. Skin: Skin is warm and dry. No rashes noted.  Data Reviewed: I have personally reviewed following labs and imaging studies  CBC: CBC Latest Ref Rng & Units 05/22/2018 05/21/2018 05/14/2018  WBC 4.0 - 10.5 K/uL 7.0 8.2 6.5  Hemoglobin 12.0 - 15.0 g/dL 10.2(L) 10.8(L) 11.1(L)  Hematocrit 36.0 - 46.0 % 33.5(L) 36.3 33.0(L)  Platelets 150 - 400 K/uL 104(L) 145(L) 143.0(L)    CMP: CMP Latest Ref Rng & Units 05/22/2018 05/21/2018 05/14/2018  Glucose 70 - 99 mg/dL 131(H) 143(H) 125(H)  BUN 8 - 23 mg/dL 27(H) 29(H) 29(H)  Creatinine 0.44 - 1.00 mg/dL 2.17(H) 2.37(H) 1.65(H)  Sodium 135 - 145 mmol/L 143 142 141  Potassium 3.5 - 5.1 mmol/L 3.6 3.5 4.4  Chloride 98 - 111 mmol/L 107 106 105  CO2 22 - 32 mmol/L 27 23 30   Calcium 8.9 - 10.3 mg/dL 8.8(L) 9.3 9.2  Total Protein 6.5 - 8.1 g/dL - 6.4(L) 6.2  Total Bilirubin 0.3 - 1.2 mg/dL - 1.2 0.9  Alkaline Phos 38 - 126 U/L - 59 59  AST 15 - 41 U/L - 55(H) 62(H)  ALT 0 - 44 U/L - 49(H) 56(H)      Radiology Studies: Ct Angio Head W Or Wo Contrast  Result Date: 05/21/2018 CLINICAL DATA:  Generalized weakness and headache. EXAM: CT ANGIOGRAPHY HEAD AND NECK TECHNIQUE: Multidetector CT imaging of the head and neck was performed using the standard  protocol during bolus administration of intravenous contrast. Multiplanar CT image reconstructions and MIPs were obtained to evaluate the vascular anatomy. Carotid stenosis measurements (when applicable) are obtained utilizing NASCET criteria, using the distal internal carotid diameter as the denominator. CONTRAST:  123m ISOVUE-370 IOPAMIDOL (ISOVUE-370) INJECTION 76% COMPARISON:  None. FINDINGS: CT HEAD FINDINGS Brain: There is no mass, hemorrhage or extra-axial collection. The size and configuration of the ventricles and extra-axial CSF spaces are normal. There is no acute or chronic infarction. The brain parenchyma is normal. Skull: The visualized skull base, calvarium and extracranial soft tissues are normal. Sinuses/Orbits: No fluid levels or advanced mucosal thickening of the visualized paranasal sinuses. No mastoid or middle ear effusion. The orbits are normal. CTA NECK FINDINGS SKELETON: There is no bony spinal canal stenosis. No lytic or blastic lesion. OTHER NECK: Normal pharynx, larynx and major salivary glands.  No cervical lymphadenopathy. Unremarkable thyroid gland. UPPER CHEST: No pneumothorax or pleural effusion. No nodules or masses. AORTIC ARCH: There is mild calcific atherosclerosis of the aortic arch. There is no aneurysm, dissection or hemodynamically significant stenosis of the visualized ascending aorta and aortic arch. Conventional 3 vessel aortic branching pattern. The visualized proximal subclavian arteries are widely patent. RIGHT CAROTID SYSTEM: --Common carotid artery: Widely patent origin without common carotid artery dissection or aneurysm. --Internal carotid artery: No dissection, occlusion or aneurysm. There is calcified atherosclerosis extending into the proximal ICA, resulting in 60% stenosis. --External carotid artery: No acute abnormality. LEFT CAROTID SYSTEM: --Common carotid artery: Widely patent origin without common carotid artery dissection or aneurysm. --Internal carotid  artery: No dissection, occlusion or aneurysm. There is calcified atherosclerosis extending into the proximal ICA, resulting in less than 50% stenosis. --External carotid artery: No acute abnormality. VERTEBRAL ARTERIES: Codominant configuration. There is atherosclerotic calcification of both vertebral artery origins. There is atherosclerotic calcification of both V3 segments without flow limiting stenosis. No dissection. CTA HEAD FINDINGS ANTERIOR CIRCULATION: --Intracranial internal carotid arteries: Atherosclerotic calcification of the internal carotid arteries at the skull base without hemodynamically significant stenosis. --Anterior cerebral arteries: Normal. Both A1 segments are present. Patent anterior communicating artery. --Middle cerebral arteries: Normal. --Posterior communicating arteries: Present on the left, absent on the right. POSTERIOR CIRCULATION: --Basilar artery: Normal. --Posterior cerebral arteries: Normal. --Superior cerebellar arteries: Normal. --Inferior cerebellar arteries: Normal anterior and posterior inferior cerebellar arteries. VENOUS SINUSES: As permitted by contrast timing, patent. ANATOMIC VARIANTS: None DELAYED PHASE: No parenchymal contrast enhancement. Review of the MIP images confirms the above findings. IMPRESSION: 1. No emergent large vessel occlusion or high-grade stenosis. 2. Approximately 60% stenosis of the proximal right internal carotid artery secondary to calcified atherosclerosis. 3.  Aortic atherosclerosis (ICD10-I70.0). 4. Bilateral atherosclerotic narrowing of the vertebral artery origins. Electronically Signed   By: Ulyses Jarred M.D.   On: 05/21/2018 17:14   Dg Chest 2 View  Result Date: 05/21/2018 CLINICAL DATA:  Back pain. EXAM: CHEST - 2 VIEW COMPARISON:  Radiographs of February 04, 2018. FINDINGS: The heart size and mediastinal contours are within normal limits. Status post aortic valve replacement. No pneumothorax or pleural effusion is noted. Both lungs  are clear. The visualized skeletal structures are unremarkable. IMPRESSION: No active cardiopulmonary disease. Electronically Signed   By: Marijo Conception, M.D.   On: 05/21/2018 15:44   Ct Angio Neck W And/or Wo Contrast  Result Date: 05/21/2018 CLINICAL DATA:  Generalized weakness and headache. EXAM: CT ANGIOGRAPHY HEAD AND NECK TECHNIQUE: Multidetector CT imaging of the head and neck was performed using the standard protocol during bolus administration of intravenous contrast. Multiplanar CT image reconstructions and MIPs were obtained to evaluate the vascular anatomy. Carotid stenosis measurements (when applicable) are obtained utilizing NASCET criteria, using the distal internal carotid diameter as the denominator. CONTRAST:  162m ISOVUE-370 IOPAMIDOL (ISOVUE-370) INJECTION 76% COMPARISON:  None. FINDINGS: CT HEAD FINDINGS Brain: There is no mass, hemorrhage or extra-axial collection. The size and configuration of the ventricles and extra-axial CSF spaces are normal. There is no acute or chronic infarction. The brain parenchyma is normal. Skull: The visualized skull base, calvarium and extracranial soft tissues are normal. Sinuses/Orbits: No fluid levels or advanced mucosal thickening of the visualized paranasal sinuses. No mastoid or middle ear effusion. The orbits are normal. CTA NECK FINDINGS SKELETON: There is no bony spinal canal stenosis. No lytic or blastic lesion. OTHER NECK: Normal pharynx, larynx and major salivary glands. No cervical  lymphadenopathy. Unremarkable thyroid gland. UPPER CHEST: No pneumothorax or pleural effusion. No nodules or masses. AORTIC ARCH: There is mild calcific atherosclerosis of the aortic arch. There is no aneurysm, dissection or hemodynamically significant stenosis of the visualized ascending aorta and aortic arch. Conventional 3 vessel aortic branching pattern. The visualized proximal subclavian arteries are widely patent. RIGHT CAROTID SYSTEM: --Common carotid artery:  Widely patent origin without common carotid artery dissection or aneurysm. --Internal carotid artery: No dissection, occlusion or aneurysm. There is calcified atherosclerosis extending into the proximal ICA, resulting in 60% stenosis. --External carotid artery: No acute abnormality. LEFT CAROTID SYSTEM: --Common carotid artery: Widely patent origin without common carotid artery dissection or aneurysm. --Internal carotid artery: No dissection, occlusion or aneurysm. There is calcified atherosclerosis extending into the proximal ICA, resulting in less than 50% stenosis. --External carotid artery: No acute abnormality. VERTEBRAL ARTERIES: Codominant configuration. There is atherosclerotic calcification of both vertebral artery origins. There is atherosclerotic calcification of both V3 segments without flow limiting stenosis. No dissection. CTA HEAD FINDINGS ANTERIOR CIRCULATION: --Intracranial internal carotid arteries: Atherosclerotic calcification of the internal carotid arteries at the skull base without hemodynamically significant stenosis. --Anterior cerebral arteries: Normal. Both A1 segments are present. Patent anterior communicating artery. --Middle cerebral arteries: Normal. --Posterior communicating arteries: Present on the left, absent on the right. POSTERIOR CIRCULATION: --Basilar artery: Normal. --Posterior cerebral arteries: Normal. --Superior cerebellar arteries: Normal. --Inferior cerebellar arteries: Normal anterior and posterior inferior cerebellar arteries. VENOUS SINUSES: As permitted by contrast timing, patent. ANATOMIC VARIANTS: None DELAYED PHASE: No parenchymal contrast enhancement. Review of the MIP images confirms the above findings. IMPRESSION: 1. No emergent large vessel occlusion or high-grade stenosis. 2. Approximately 60% stenosis of the proximal right internal carotid artery secondary to calcified atherosclerosis. 3.  Aortic atherosclerosis (ICD10-I70.0). 4. Bilateral atherosclerotic  narrowing of the vertebral artery origins. Electronically Signed   By: Ulyses Jarred M.D.   On: 05/21/2018 17:14   Ct Angio Chest Pe W And/or Wo Contrast  Result Date: 05/21/2018 CLINICAL DATA:  Chest pain. Back pain. EXAM: CT ANGIOGRAPHY CHEST WITH CONTRAST TECHNIQUE: Multidetector CT imaging of the chest was performed using the standard protocol during bolus administration of intravenous contrast. Multiplanar CT image reconstructions and MIPs were obtained to evaluate the vascular anatomy. CONTRAST:  18m ISOVUE-370 IOPAMIDOL (ISOVUE-370) INJECTION 76% COMPARISON:  Chest x-ray dated 05/21/2018 FINDINGS: Cardiovascular: Satisfactory opacification of the pulmonary arteries to the segmental level. No evidence of pulmonary embolism. Normal heart size. Extensive aortic atherosclerosis and coronary artery calcifications. No pericardial effusion. Mediastinum/Nodes: No enlarged mediastinal, hilar, or axillary lymph nodes. Thyroid gland, trachea, and esophagus demonstrate no significant findings. Lungs/Pleura: Lungs are clear. No pleural effusion or pneumothorax. Upper Abdomen: No acute abnormality. Musculoskeletal: No chest wall abnormality. No acute or significant osseous findings. There is a 4 cm cystic structure in the right breast consistent with a seroma from previous breast surgery. Markers are adjacent to the cyst. Review of the MIP images confirms the above findings. IMPRESSION: 1. No pulmonary emboli or other acute abnormalities. 2. Benign-appearing seroma in the right breast. Aortic Atherosclerosis (ICD10-I70.0). Electronically Signed   By: JLorriane ShireM.D.   On: 05/21/2018 17:15   [place]dom[place] Complete  Result Date: 05/19/2018 CLINICAL DATA:  Nausea dyspepsia right upper quadrant pain EXAM: ABDOMEN ULTRASOUND COMPLETE COMPARISON:  07/02/2017 renal ultrasound FINDINGS: Gallbladder: Sludge and small stones measuring up to 5 mm. Negative sonographic Murphy. Normal wall thickness. Common bile duct:  Diameter: 4 mm Liver: No focal lesion identified. Within normal limits  in parenchymal echogenicity. Portal vein is patent on color Doppler imaging with normal direction of blood flow towards the liver. IVC: No abnormality visualized. Pancreas: Visualized portion unremarkable. Spleen: Size and appearance within normal limits. Right Kidney: Length: 8.5 cm. Cortical echogenicity normal. No hydronephrosis. Possible punctate 4 mm stone in the midpole. Left Kidney: Length: 9.1 cm. Cortical echogenicity normal. No hydronephrosis. Possible small 3 mm stone mid left kidney. Aorta: Difficult to visualize.  No aneurysm. Other findings: None. IMPRESSION: 1. Small stones and sludge within the gallbladder without biliary dilatation or other sonographic features to suggest acute cholecystitis 2. Possible small nonobstructing kidney stones Electronically Signed   By: Donavan Foil M.D.   On: 05/19/2018 18:25  Discussed extensively with the patient and patient's husband.  If she starts having increasing abdominal pain while the work-up is in progress, she would seek emergency care.    Carmell Austria, MD 05/29/2018, 9:06 AM  Cc: Mackie Pai, PA-C

## 2018-05-29 NOTE — Patient Instructions (Addendum)
If you are age 72 or older, your body mass index should be between 23-30. Your Body mass index is 30.47 kg/m. If this is out of the aforementioned range listed, please consider follow up with your Primary Care Provider.  If you are age 81 or younger, your body mass index should be between 19-25. Your Body mass index is 30.47 kg/m. If this is out of the aformentioned range listed, please consider follow up with your Primary Care Provider.   We have sent the following medications to your pharmacy for you to pick up at your convenience: Stop Omeprazole start Protonix 40 mg once daily  Decrease Carafate to twice daily.   You have been scheduled for an endoscopy. Please follow written instructions given to you at your visit today. If you use inhalers (even only as needed), please bring them with you on the day of your procedure. Your physician has requested that you go to www.[email or web address].com and enter the access code given to you at your visit today. This web site gives a general overview about your procedure. However, you should still follow specific instructions given to you by our office regarding your preparation for the procedure.   You will be contacted by our office prior to your procedure for directions on holding your Eliquis.  If you do not hear from our office 1 week prior to your scheduled procedure, please call [phone] to discuss.    You have been scheduled for a CT scan of the abdomen and pelvis at [facility].   You are scheduled on 06/10/18 at [facility] should arrive 15 minutes prior to your appointment time for registration. Please follow the written instructions below on the day of your exam:  WARNING: IF YOU ARE ALLERGIC TO IODINE/X-RAY DYE, PLEASE NOTIFY RADIOLOGY IMMEDIATELY AT [phone]! YOU WILL BE GIVEN A 13 HOUR PREMEDICATION PREP.  1) Do not eat or drink anything after 5am (4 hours prior to your test)  You may take any medications as prescribed with a small amount  of water, if necessary. If you take any of the following medications: METFORMIN, GLUCOPHAGE, GLUCOVANCE, AVANDAMET, RIOMET, FORTAMET, [place]ue MET, JANUMET, GLUMETZA or METAGLIP, you MAY be asked to HOLD this medication 48 hours AFTER the exam.  The purpose of you drinking the oral contrast is to aid in the visualization of your intestinal tract. The contrast solution may cause some diarrhea. Depending on your individual set of symptoms, you may also receive an intravenous injection of x-ray contrast/dye. Plan on being at [facility] for 30 minutes or longer, depending on the type of exam you are having performed.  This test typically takes 30-45 minutes to complete.  If you have any questions regarding your exam or if you need to reschedule, you may call the CT department at [phone] between the hours of 8:00 am and 5:00 pm, Monday-Friday.  ________________________________________________________________________   Thank you,  Dr. Jackquline Denmark

## 2018-05-30 ENCOUNTER — Telehealth: Payer: Self-pay

## 2018-05-30 NOTE — Telephone Encounter (Signed)
[facility] Pre-operative Risk Assessment     Request for surgical clearance:     Endoscopy Procedure  What type of surgery is being performed?     EGD  When is this surgery scheduled?     06/17/18  What type of clearance is required ?   Pharmacy  Are there any medications that need to be held prior to surgery and how long? Eliquis 2 days   Practice name and name of physician performing surgery?      [facility]          Jackquline Denmark    What is your office phone and fax number?      Phone- [phone]  [phone]  Anesthesia type (None, local, MAC, general) ?       MAC

## 2018-05-30 NOTE — Telephone Encounter (Signed)
Patient with diagnosis of Afib on Eliquis for anticoagulation.    Procedure: endoscopy Date of procedure: 06/17/18  CHADS2-VASc score of  8 (CHF, HTN, AGE, DM2, stroke/tia x 2, CAD, AGE, female)  CrCl 64min/ml  Per office protocol, patient can hold Eliquis for 24 hours prior to procedure.

## 2018-05-31 ENCOUNTER — Emergency Department (HOSPITAL_COMMUNITY): Payer: Medicare Other

## 2018-05-31 ENCOUNTER — Other Ambulatory Visit: Payer: Self-pay

## 2018-05-31 ENCOUNTER — Emergency Department (HOSPITAL_COMMUNITY)
Admission: EM | Admit: 2018-05-31 | Discharge: 2018-05-31 | Disposition: A | Discharge status: Home / Self Care | Payer: Medicare Other | Attending: Emergency Medicine | Admitting: Emergency Medicine

## 2018-05-31 DIAGNOSIS — I2510 Atherosclerotic heart disease of native coronary artery without angina pectoris: Secondary | ICD-10-CM | POA: Insufficient documentation

## 2018-05-31 DIAGNOSIS — Z79899 Other long term (current) drug therapy: Secondary | ICD-10-CM | POA: Diagnosis not present

## 2018-05-31 DIAGNOSIS — R109 Unspecified abdominal pain: Secondary | ICD-10-CM

## 2018-05-31 DIAGNOSIS — Z7901 Long term (current) use of anticoagulants: Secondary | ICD-10-CM | POA: Insufficient documentation

## 2018-05-31 DIAGNOSIS — R55 Syncope and collapse: Secondary | ICD-10-CM | POA: Diagnosis present

## 2018-05-31 DIAGNOSIS — Z794 Long term (current) use of insulin: Secondary | ICD-10-CM | POA: Diagnosis not present

## 2018-05-31 DIAGNOSIS — Z91040 Latex allergy status: Secondary | ICD-10-CM | POA: Diagnosis not present

## 2018-05-31 DIAGNOSIS — I130 Hypertensive heart and chronic kidney disease with heart failure and stage 1 through stage 4 chronic kidney disease, or unspecified chronic kidney disease: Secondary | ICD-10-CM | POA: Insufficient documentation

## 2018-05-31 DIAGNOSIS — I5042 Chronic combined systolic (congestive) and diastolic (congestive) heart failure: Secondary | ICD-10-CM | POA: Diagnosis not present

## 2018-05-31 DIAGNOSIS — N184 Chronic kidney disease, stage 4 (severe): Secondary | ICD-10-CM | POA: Insufficient documentation

## 2018-05-31 DIAGNOSIS — I959 Hypotension, unspecified: Secondary | ICD-10-CM | POA: Insufficient documentation

## 2018-05-31 DIAGNOSIS — E1122 Type 2 diabetes mellitus with diabetic chronic kidney disease: Secondary | ICD-10-CM | POA: Insufficient documentation

## 2018-05-31 DIAGNOSIS — E861 Hypovolemia: Secondary | ICD-10-CM | POA: Insufficient documentation

## 2018-05-31 DIAGNOSIS — K8020 Calculus of gallbladder without cholecystitis without obstruction: Secondary | ICD-10-CM | POA: Diagnosis not present

## 2018-05-31 DIAGNOSIS — I9589 Other hypotension: Secondary | ICD-10-CM

## 2018-05-31 DIAGNOSIS — J4540 Moderate persistent asthma, uncomplicated: Secondary | ICD-10-CM | POA: Insufficient documentation

## 2018-05-31 DIAGNOSIS — Z853 Personal history of malignant neoplasm of breast: Secondary | ICD-10-CM | POA: Diagnosis not present

## 2018-05-31 DIAGNOSIS — Z8673 Personal history of transient ischemic attack (TIA), and cerebral infarction without residual deficits: Secondary | ICD-10-CM | POA: Insufficient documentation

## 2018-05-31 LAB — COMPREHENSIVE METABOLIC PANEL
ALT: 71 U/L — ABNORMAL HIGH (ref 0–44)
AST: 79 U/L — ABNORMAL HIGH (ref 15–41)
Albumin: 2.9 g/dL — ABNORMAL LOW (ref 3.5–5.0)
Alkaline Phosphatase: 67 U/L (ref 38–126)
Anion gap: 12 (ref 5–15)
BUN: 30 mg/dL — ABNORMAL HIGH (ref 8–23)
CO2: 26 mmol/L (ref 22–32)
Calcium: 8.9 mg/dL (ref 8.9–10.3)
Chloride: 103 mmol/L (ref 98–111)
Creatinine, Ser: 2.36 mg/dL — ABNORMAL HIGH (ref 0.44–1.00)
GFR calc Af Amer: 23 mL/min — ABNORMAL LOW (ref >60)
GFR calc non Af Amer: 20 mL/min — ABNORMAL LOW (ref >60)
Glucose, Bld: 175 mg/dL — ABNORMAL HIGH (ref 70–99)
Potassium: 3 mmol/L — ABNORMAL LOW (ref 3.5–5.1)
Sodium: 141 mmol/L (ref 135–145)
Total Bilirubin: 1 mg/dL (ref 0.3–1.2)
Total Protein: 5.9 g/dL — ABNORMAL LOW (ref 6.5–8.1)

## 2018-05-31 LAB — CBC
HCT: 32.4 % — ABNORMAL LOW (ref 36.0–46.0)
Hemoglobin: 10.3 g/dL — ABNORMAL LOW (ref 12.0–15.0)
MCH: 31 pg (ref 26.0–34.0)
MCHC: 31.8 g/dL (ref 30.0–36.0)
MCV: 97.6 fL (ref 80.0–100.0)
Platelets: 112 10*3/uL — ABNORMAL LOW (ref 150–400)
RBC: 3.32 MIL/uL — ABNORMAL LOW (ref 3.87–5.11)
RDW: 13.1 % (ref 11.5–15.5)
WBC: 12.9 10*3/uL — ABNORMAL HIGH (ref 4.0–10.5)
nRBC: 0 % (ref 0.0–0.2)

## 2018-05-31 LAB — I-STAT TROPONIN, ED: Troponin i, poc: 0.02 ng/mL (ref 0.00–0.08)

## 2018-05-31 LAB — CBG MONITORING, ED: Glucose-Capillary: 168 mg/dL — ABNORMAL HIGH (ref 70–99)

## 2018-05-31 LAB — MAGNESIUM: Magnesium: 1.9 mg/dL (ref 1.7–2.4)

## 2018-05-31 LAB — LIPASE, BLOOD: Lipase: 28 U/L (ref 11–51)

## 2018-05-31 MED ORDER — SODIUM CHLORIDE 0.9 % IV BOLUS
1000.0000 mL | Freq: Once | INTRAVENOUS | Status: AC
  Administered 2018-05-31: 1000 mL via INTRAVENOUS

## 2018-05-31 MED ORDER — POTASSIUM CHLORIDE CRYS ER 20 MEQ PO TBCR
40.0000 meq | EXTENDED_RELEASE_TABLET | Freq: Once | ORAL | Status: AC
  Administered 2018-05-31: 40 meq via ORAL
  Filled 2018-05-31: qty 2

## 2018-05-31 NOTE — ED Provider Notes (Signed)
[place] EMERGENCY DEPARTMENT Provider Note   CSN: [id number] Arrival date & time: 05/31/18  0005     History   Chief Complaint Chief Complaint  Patient presents with  . Loss of Consciousness    HPI Hannah [name] is a 72 y.o. female.  Patient with history of recurrent syncope, quintuple bypass 1998, left ear status post valve replacement, CAD, asthma, CHF, gallstones, seizure disorder presents with syncope.  Patient was at home approximately 1 hour ago where she got out of the shower and had increasing epigastric and right upper quadrant pain when she sat down patient was observed by her husband to have "lost consciousness".  She denies rendering any event.  Denies any loss of bowel or bladder function.  Denies any head trauma.  Patient had recurrent episode of syncope about 1 week ago where she had extensive work-up including labs, CT head neck chest which were all normal.  EKG was baseline and troponin was negative.  Patient was found to be dehydrated with orthostatic dysfunction.  Patient endorses a history of syncopal episodes over the past 5 years with worsening over the past year.  Patient does wear stockings at home's and does try to drink enough fluid to keep up with her known dehydration.     Past Medical History:  Diagnosis Date  . Abnormally small mouth   . Acute on chronic combined systolic and diastolic congestive heart failure ([place]) 07/17/2013  . Adrenal insufficiency ([place]) 07/03/2016  . AKI (acute kidney injury) ([place]) 09/01/2017  . Allergic rhinitis 10/14/2009   Qualifier: Diagnosis of  By: Lamonte Sakai MD, Rose Fillers   Overview:  Overview:  Qualifier: Diagnosis of  By: Lamonte Sakai MD, Rose Fillers  Last Assessment & Plan:  Please continue Xyzal and Nasacort as you have been using them  . Anemia   . Asthma 05/12/2009   10/12/2014 p extensive coaching HFA effectiveness =    75% s spacer    Overview:  Overview:  10/12/2014 p extensive coaching HFA effectiveness =    75% s  spacer   Last Assessment & Plan:  Please continue Symbicort 2 puffs twice a day. Remember to rinse and gargle after taking this medication. Take albuterol 2 puffs up to every 4 hours if needed for shortness of breath.  Follow with Dr Lamonte Sakai in 6 month  . Atrial tachycardia ([place]) 04/03/2013   Overview:  Last Assessment & Plan:  I have discussed the likely benign nature of her problem. She has very minimal palpitations. I doubt atrial flutter. I would not anti-coagulate. Her monitor demonstrates that her episodes are short lived. Will follow.   . Bilateral carotid artery stenosis    Bilateral ICA 40-59%/  >50% LECA  . Breast cancer ([place]) 01/18/2016   right breast  . Breast cancer of upper-inner quadrant of right female breast ([place]) 12/16/2015  . CAD (coronary artery disease) cardiologist-  dr Aundra Dubin   Remote CABG in 1998 with redo in 2009  . Chemotherapy induced nausea and vomiting 02/24/2016  . Chemotherapy-induced peripheral neuropathy ([place]) 04/27/2016  . 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 CHF (congestive heart failure) (HCC)    EF is low normal at 50 to 55% per echo in Jan. 2011  . Chronic coronary artery disease 01/24/2011   Overview:  Last Assessment & Plan:  Stable with no ischemic symptoms.  Continue ASA, statin, ACEI, beta blocker.   . Chronic diastolic CHF (congestive heart failure) ([place]) 09/24/2013  . CKD (chronic kidney disease), stage IV (  [place]) 09/24/2013  . Closed fracture of head of left humerus 09/09/2017  . Essential hypertension 05/12/2009   Qualifier: Diagnosis of  By: Lamonte Sakai MD, Rose Fillers   . Gallstones   . GERD (gastroesophageal reflux disease)   . Glaucoma   . H/O atrial tachycardia 05/12/2009   Qualifier: History of  By: Lamonte Sakai MD, Rose Fillers   Overview:  Overview:  Qualifier: History of  By: Lamonte Sakai MD, Rose Fillers  Last Assessment & Plan:  There was no evidence of this on her cardiac monitor. I would recommend watchful waiting.   Marland Kitchen Hearing loss of right ear   . History of  aortic valve replacement 10/27/2010   Overview:  Last Assessment & Plan:  Will get echo to reassess bioprosthetic aortic valve.   Marland Kitchen History of non-ST elevation myocardial infarction (NSTEMI)    Sept 2014--  thought to be type II HTN w/ LHC without infarct related artery and patent grafts  . History of radiation therapy 05/24/16-07/26/16   right breast 50.4 Gy in 28 fractions, right breast boost 10 Gy in 5 fractions  . Hyperlipidemia   . Hypertension    Resolved.  . Iron deficiency anemia   . Mania ([place]) 03/05/2016  . Moderate persistent asthma    pulmologist-  Dr. Malvin Johns  . Nonischemic cardiomyopathy ([place])   . Osteomyelitis of toe of left foot ([place]) 04/06/2016  . Osteopenia of multiple sites 10/19/2015  . PAF (paroxysmal atrial fibrillation) ([place])   . Personal history of chemotherapy 2017  . Personal history of radiation therapy 2017  . Port catheter in place 02/17/2016  . Psoriasis    right leg  . Renal calculus, right   . Renal insufficiency, mild   . S/P AVR    prosthesis valve placement 2009 at same time re-do CABG  . Seizure ([place]) 06/29/2016  . Seizure-like activity ([place]) 09/01/2017  . Sensorineural hearing loss (SNHL) of both ears 01/06/2016  . Stroke [facility])    residual rt hearing loss  . Syncopal episodes 05/21/2018  . Syncope 09/06/2017  . Type 2 diabetes mellitus ([place])    monitored by dr Dwyane Dee    Patient Active Problem List   Diagnosis Date Noted  . Prolonged Q-T interval on ECG 05/21/2018  . Atrial fibrillation ([place]) 01/23/2018  . Closed fracture of head of left humerus 09/09/2017  . Syncope 09/06/2017  . Seizure-like activity ([place]) 09/01/2017  . AKI (acute kidney injury) ([place]) 09/01/2017  . Adrenal insufficiency ([place]) 07/03/2016  . Seizure ([place]) 06/29/2016  . Chemotherapy-induced peripheral neuropathy ([place]) 04/27/2016  . 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 Osteomyelitis of toe of left foot ([place]) 04/06/2016  . Mania ([place]) 03/05/2016  . GERD  (gastroesophageal reflux disease) 02/29/2016  . Chemotherapy induced nausea and vomiting 02/24/2016  . Port catheter in place 02/17/2016  . Sensorineural hearing loss (SNHL) of both ears 01/06/2016  . Breast cancer of upper-inner quadrant of right female breast ([place]) 12/16/2015  . Osteopenia of multiple sites 10/19/2015  . CKD (chronic kidney disease), stage IV ([place]) 09/24/2013  . Chronic diastolic CHF (congestive heart failure) ([place]) 09/24/2013  . Acute on chronic combined systolic and diastolic congestive heart failure ([place]) 07/17/2013  . Atrial tachycardia ([place]) 04/03/2013  . CAD (coronary artery disease) 01/24/2011  . Chronic coronary artery disease 01/24/2011  . History of aortic valve replacement 10/27/2010  . Hyperlipidemia   . Nonischemic cardiomyopathy ([place])   . Anemia   . Type 2 diabetes mellitus ([place])   . Iron deficiency anemia   .  Allergic rhinitis 10/14/2009  . Essential hypertension 05/12/2009  . H/O atrial tachycardia 05/12/2009  . Asthma 05/12/2009    Past Surgical History:  Procedure Laterality Date  . AORTIC VALVE REPLACEMENT  2009   [facility]done same time is CABG)  . BREAST LUMPECTOMY Right 01/18/2016  . BREAST LUMPECTOMY WITH RADIOACTIVE SEED AND SENTINEL LYMPH NODE BIOPSY Right 01/18/2016   Procedure: RIGHT BREAST LUMPECTOMY WITH RADIOACTIVE SEED AND SENTINEL LYMPH NODE BIOPSY;  Surgeon: DAlphonsa Overall MD;  [facility]  Service: General;  Laterality: Right;  . CARDIAC CATHETERIZATION  03/23/2008   Pre-redo CABG: L main OK, LAD (T), CFX (T), OM1 99%, RCA (T), LIMA-LAD OK, SVG-OM(?3) OK w/ little florw to OM2, SVG-RCA OK. EF NL  . CARPAL TUNNEL RELEASE    . COLONOSCOPY     around 2015. Possibly with [facility]   . CORONARY ARTERY BYPASS GRAFT  1998 &  re-do 2009   Had LIMA to DX/LAD, SVG to 2 marginal [facility]; SVG to 3rd OM and PD at time of redo  . CYSTOSCOPY W/ URETERAL STENT PLACEMENT Right  12/20/2014   Procedure: CYSTOSCOPY WITH RETROGRADE PYELOGRAM/URETERAL STENT PLACEMENT;  Surgeon: PCleon Gustin MD;  [facility]  Service: Urology;  Laterality: Right;  . ESOPHAGOGASTRODUODENOSCOPY     many years ago per patient   . EYE SURGERY Bilateral    cataracts  . HOLMIUM LASER APPLICATION Right 73/00/9233  Procedure:  HOLMIUM LASER LITHOTRIPSY;  Surgeon: PCleon Gustin MD;  [facility]  Service: Urology;  Laterality: Right;  . LEFT HEART CATHETERIZATION WITH CORONARY/GRAFT ANGIOGRAM N/A 02/23/2013   Procedure: LEFT HEART CATHETERIZATION WITH CBeatrix Fetters  Surgeon: MBlane Ohara MD;  [facility];  Service: Cardiovascular;  Laterality: N/A;  . LOOP RECORDER INSERTION N/A 08/30/2017   Procedure: LOOP RECORDER INSERTION;  Surgeon: TEvans Lance MD;  [place];  Service: Cardiovascular;  Laterality: N/A;  . PORTACATH PLACEMENT Left 01/18/2016   Procedure: INSERTION PORT-A-CATH;  Surgeon: DAlphonsa Overall MD;  [place] [facility]  Service: General;  Laterality: Left;  . portacath removal    . TONSILLECTOMY    . TRANSTHORACIC ECHOCARDIOGRAM  02-24-2013      mild LVH,  ef 50-55%/  AV bioprosthesis was present with very mild stenosis and no regurg., mean grandient 159mg, peak grandient 2051m /  mild MR/  mild LAE and RAE/  moderate TR  . TUBAL LIGATION       OB History   No obstetric history on file.      Home Medications    Prior to Admission medications   Medication Sig Start Date End Date Taking? Authorizing Provider  ACCU-CHEK GUIDE test strip Use to test blood sugar 3 times daily 02/18/18  Yes KumElayne SnareD  acetaminophen (TYLENOL) 325 MG tablet Take 2 tablets (650 mg total) by mouth every 6 (six) hours as needed for mild pain (or Fever >/= 101). 09/03/17  Yes Vann, Jessica U, DO  albuterol (PROVENTIL HFA;VENTOLIN HFA) 108 (90 Base) MCG/ACT inhaler Inhale 2 puffs into the lungs  every 6 (six) hours as needed for wheezing or shortness of breath.   Yes [provider]  amiodarone (PACERONE) 200 MG tablet Take 200 mg by mouth daily.   Yes [provider]  apixaban (ELIQUIS) 5 MG TABS tablet Take 1 tablet (5 mg total) by mouth 2 (two) times daily. 02/18/18  Yes Evans Lance, MD  budesonide-formoterol [facility]) 160-4.5 MCG/ACT inhaler Inhale 2 puffs into the lungs 2 (two) times daily. 01/23/18  Yes Collene Gobble, MD  exemestane (AROMASIN) 25 MG tablet TAKE ONE TABLET BY MOUTH DAILY AFTER BREAKFAST Patient taking differently: Take 25 mg by mouth daily after breakfast.  11/20/17  Yes Nicholas Lose, MD  ezetimibe (ZETIA) 10 MG tablet Take 1 tablet (10 mg total) by mouth daily. 01/24/18  Yes Evans Lance, MD  fludrocortisone (FLORINEF) 0.1 MG tablet Take 1 tablet (0.1 mg total) by mouth daily. 10/01/17  Yes [name], NP  furosemide (LASIX) 20 MG tablet Take 1 tablet (20 mg total) by mouth daily as needed for fluid. 02/04/18  Yes Harris, Abigail, PA-C  Insulin Disposable Pump (V-GO 20) KIT 1 each by Does not apply route daily. 05/21/18  Yes Elayne Snare, MD  insulin lispro (HUMALOG KWIKPEN) 100 UNIT/ML KiwkPen 2-6 Units at bedtime as needed (CBG >200).    Yes [provider]  insulin lispro (HUMALOG) 100 UNIT/ML injection INJECT 76 UNITS DAILY WITH  V-GO PUMP 09/10/17  Yes Elayne Snare, MD  metoprolol tartrate (LOPRESSOR) 25 MG tablet May take a total of 4 tablets in a 24 hour period. Patient taking differently: Take 25 mg by mouth daily. May take a total of 4 tablets in a 24 hour period. 12/09/17  Yes [name], NP  omeprazole (PRILOSEC) 40 MG capsule Take 1 capsule (40 mg total) by mouth daily. 05/14/18  Yes Saguier, Percell Miller, PA-C  pantoprazole (PROTONIX) 40 MG tablet Take 1 tablet (40 mg total) by mouth daily. 05/29/18  Yes Jackquline Denmark, MD  rosuvastatin (CRESTOR) 40 MG tablet TAKE ONE TABLET BY MOUTH DAILY Patient taking differently: Take 40  mg by mouth daily.  08/28/17  Yes [name], NP  sucralfate (CARAFATE) 1 g tablet Take 1 tablet (1 g total) by mouth 4 (four) times daily -  with meals and at bedtime. 05/14/18  Yes Saguier, Percell Miller, PA-C  TRULICITY 1.5 QA/8.3MH SOPN INJECT THE CONTENTS OF 1  PEN SUBCUTANEOUSLY WEEKLY  AS DIRECTED Patient taking differently: Inject 1 pen into the skin every Saturday.  04/15/18  Yes Elayne Snare, MD  zolpidem (AMBIEN) 5 MG tablet Take 1 tablet (5 mg total) by mouth at bedtime as needed for sleep. 05/06/18  Yes Saguier, Percell Miller PA-C    Family History Family History  Problem Relation Age of Onset  . Heart disease Father   . Heart failure Father   . Diabetes Maternal Grandmother   . Heart disease Maternal Grandmother   . Diabetes Son   . Healthy Brother        #1  . Heart attack Brother        #2  . Heart disease Brother        #2  . Colon cancer Neg Hx   . Esophageal cancer Neg Hx     Social History Social History   Tobacco Use  . Smoking status: Never Smoker  . Smokeless tobacco: Never Used  Substance Use Topics  . Alcohol use: No  . Drug use: No     Allergies   Amoxicillin; Imdur [isosorbide dinitrate]; Tape; Arimidex [anastrozole]; Latex; and Tetracycline   Review of Systems Review of Systems  Constitutional: Positive for activity change.  HENT: Negative for congestion.   Respiratory: Negative for apnea, cough and shortness of breath.   Cardiovascular: Negative for chest pain.  Gastrointestinal: Positive for abdominal pain and vomiting.  Endocrine: Negative for polyuria.  Genitourinary: Negative.   Musculoskeletal:       Back pain b/t shoulder blades  Neurological: Positive for syncope.  Psychiatric/Behavioral: Negative for agitation, behavioral problems and confusion.  All other systems reviewed and are negative.    Physical Exam Updated Vital Signs BP (!) 162/62 (BP Location: Left Arm)   Pulse 68   Temp 98.4 F (36.9 C) (Oral)   Resp 16   SpO2 98%     Physical Exam   ED Treatments / Results  Labs (all labs ordered are listed, but only abnormal results are displayed) Labs Reviewed  COMPREHENSIVE METABOLIC PANEL - Abnormal; Notable for the following components:      Result Value   Potassium 3.0 (*)    Glucose, Bld 175 (*)    BUN 30 (*)    Creatinine, Ser 2.36 (*)    Total Protein 5.9 (*)    Albumin 2.9 (*)    AST 79 (*)    ALT 71 (*)    GFR calc non Af Amer 20 (*)    GFR calc Af Amer 23 (*)    All other components within normal limits  CBC - Abnormal; Notable for the following components:   WBC 12.9 (*)    RBC 3.32 (*)    Hemoglobin 10.3 (*)    HCT 32.4 (*)    Platelets 112 (*)    All other components within normal limits  CBG MONITORING, ED - Abnormal; Notable for the following components:   Glucose-Capillary 168 (*)    All other components within normal limits  LIPASE, BLOOD  MAGNESIUM  I-STAT TROPONIN, ED    EKG EKG Interpretation  Date/Time:  Saturday May 31 2018 00:12:51 EST Ventricular Rate:  56 PR Interval:    QRS Duration: 109 QT Interval:  566 QTC Calculation: 547 R Axis:   -20 Text Interpretation:  Sinus rhythm Borderline left axis deviation Nonspecific T abnormalities, lateral leads Prolonged QT interval No significant change since last tracing Confirmed by Deno Etienne [id number]) on 05/31/2018 12:32:09 AM   Orthostatic VS for the past 24 hrs (Last 3 readings):  BP- Lying Pulse- Lying BP- Sitting Pulse- Sitting BP- Standing at 0 minutes Pulse- Standing at 0 minutes BP- Standing at 3 minutes Pulse- Standing at 3 minutes  05/31/18 0048 144/50 53 143/56 55 121/41 57 120/67 61     Radiology US Abdomen Limited Ruq  Result Date: 05/31/2018 CLINICAL DATA:  Acute onset of generalized abdominal pain. EXAM: ULTRASOUND ABDOMEN LIMITED RIGHT UPPER QUADRANT COMPARISON:  Abdominal ultrasound performed 05/19/2018 FINDINGS: Gallbladder: Small stones and sludge are noted within the gallbladder, similar to the  prior ultrasound. No gallbladder wall thickening or pericholecystic fluid is seen. No ultrasonographic Murphy's sign is elicited. Common bile duct: Diameter: 0.3 cm, within normal limits in caliber. Liver: No focal lesion identified. Within normal limits in parenchymal echogenicity. Portal vein is patent on color Doppler imaging with normal direction of blood flow towards the liver. IMPRESSION: 1. No acute abnormality seen to explain the patient's symptoms. 2. Cholelithiasis and sludge within the gallbladder, similar to the recent prior ultrasound. Gallbladder otherwise unremarkable. Electronically Signed   By: Garald Balding M.D.   On: 05/31/2018 04:01    Procedures Procedures (including critical care time)  Medications Ordered in ED Medications  sodium chloride 0.9 % bolus 1,000 mL (0 mLs Intravenous Stopped 05/31/18 0155)  potassium chloride SA (K-DUR,KLOR-CON) CR tablet 40 mEq (40 mEq Oral Given 05/31/18 0443)  Initial Impression / Assessment and Plan / ED Course  I have reviewed the triage vital signs and the nursing notes.  Pertinent labs & imaging results that were available during my care of the patient were reviewed by me and considered in my medical decision making (see chart for details).  Clinical Course as of May 31 458  Sat May 31, 2018  0120 Comment 3:        [AT]    Clinical Course User Index [AT] Bonnita Hollow, MD    Patient presenting with episode of loss of consciousness.  Patient has had recent episode of similar symptoms where she presented to the ED week ago.  Work-up was negative.  Patient has significant cardiac history including quintuple bypass and aortic regurgitation status post valve replacement and CHF.  ACS could cause syncopal episode and epigastric pain could be associate with abdominal pain.  Worsening AR could also cause syncope.  Patient is a diabetic with diabetic with insulin pump but CBG was 168 and reportedly 198 at home via EMS, so  hypoglycemia is not suspected.  Patient has known orthostatic dysfunction, and at the onset of the event was changing position after getting out of a hot shower and having pain.  Patient may have had vagal episode due to the abdominal pain.  Given ongoing abdominal pain and recent find gall stones, will obtain CMP to review electrolytes and bilirubin/LFTs.  Will get EKG and troponin to assess cardiac function.  Will check orthostatics and give fluids as see if there is improvement with that.    Interval update Orthostatics positive likely causing syncopal episode.  Will recheck after giving fluids.  Will get right upper quadrant ultrasound to evaluate for gallbladder dysfunction.  Interval update Had cholelithiasis without cholecystitis on right upper quadrant ultrasound.  Patient wanting to go home.  She is improved pain.  Please coordinate surgical efforts with her GI and cardiologist in the outpatient setting.  Encourage patient to stay very well hydrated, patient has Zofran at home to help with nausea.  Patient mild hypokalemia and was repleted with K-Dur.  Final Clinical Impressions(s) / ED Diagnoses   Final diagnoses:  Abdominal pain  Hypotension due to hypovolemia  Calculus of gallbladder without cholecystitis without obstruction    ED Discharge Orders    None       Bonnita Hollow, MD 05/31/18 [place], [place], DO 05/31/18 [place], [place], DO 06/09/18 1545

## 2018-05-31 NOTE — Discharge Instructions (Signed)
Recheck electrolytes with regular doctor.

## 2018-05-31 NOTE — ED Triage Notes (Signed)
Per [facility], Pt reports that she has had gallbladder issues, and been on a bland diet. Pt reports she started having abdominal pain after dinner. Pt reported she had 1 episode of vomiting. Pt lost consciousness while sitting, did not fall or hit head. Pt reports she had another syncopal episode last Wednesday, reports they thought her BP dropped and she was dehydrated then. Pt reports she feels better now, reports 4/10 abdominal pain and some nausea.

## 2018-05-31 NOTE — ED Notes (Signed)
Lab to add-on Mag

## 2018-05-31 NOTE — ED Notes (Signed)
Patient verbalizes understanding of discharge instructions. Opportunity for questioning and answers were provided. Armband removed by staff, pt discharged from ED in wheelchair.  

## 2018-06-02 ENCOUNTER — Telehealth: Payer: Self-pay | Admitting: Gastroenterology

## 2018-06-02 NOTE — Telephone Encounter (Signed)
Proceed with CTA followed by EGD off anti-coagulation

## 2018-06-02 NOTE — Telephone Encounter (Signed)
Please review patient message-patient has not been contacted as of yet-waiting on MD response- Per previous documentation-Per office protocol, patient can hold Eliquis for 24 hours prior to procedure.   Please advise if the patient #1 needs to have her CTA abd/pel moved sooner or leave it scheduled for 06/10/18 @ 0900? Or move EGD from 06/17/18 @ 0900? Or alternative plan of action?

## 2018-06-02 NOTE — Telephone Encounter (Signed)
Pt called to inform that she had an episode of gallbladder pain, nausea and vomiting last Friday and she had to go to [facility] ER, she wants to know if she can have egd sooner than 06/17/2018. Pls call her.

## 2018-06-03 ENCOUNTER — Encounter: Payer: Self-pay | Admitting: Gastroenterology

## 2018-06-03 NOTE — Telephone Encounter (Signed)
Called and spoke with patient- patient informed of MD recommendations -proceed with CTA abd/pel on 06/10/18 @ 9:00 am and then followed by EGD on 06/17/18 @ 9:00 am; Patient verbalized understanding of information/instructions; Patient was advised to call back if questions/concerns arise;

## 2018-06-05 NOTE — Telephone Encounter (Signed)
Patient called into office requesting to know when to hold the Eliquis-patient informed to hold medication for 24 hrs prior to procedure per cardiology clearance notation; Patient verbalized understanding of information/instructions; Patient was advised to call back if questions/concerns arise;

## 2018-06-06 ENCOUNTER — Telehealth: Payer: Self-pay | Admitting: Gastroenterology

## 2018-06-06 NOTE — Telephone Encounter (Signed)
Please review previous message and advise 

## 2018-06-06 NOTE — Telephone Encounter (Signed)
banda from [facility] imaging ctr. Called to inform that pt's creatine level was 2.36 on 05/31/18, and GFR 20. Pt is scheduled for a Ct scan with contrast on 06/10/18, she wants to know if order can be changed to without contrast due to high creatine levels. She can be reached at [phone].

## 2018-06-07 LAB — CUP PACEART REMOTE DEVICE CHECK
Date Time Interrogation Session: 20191108170520
Implantable Pulse Generator Implant Date: 20190322

## 2018-06-10 ENCOUNTER — Ambulatory Visit (HOSPITAL_COMMUNITY)
Admission: RE | Admit: 2018-06-10 | Discharge: 2018-06-10 | Disposition: A | Discharge status: Home / Self Care | Payer: Medicare Other | Source: Ambulatory Visit | Attending: Gastroenterology | Admitting: Gastroenterology

## 2018-06-10 ENCOUNTER — Encounter (HOSPITAL_COMMUNITY): Payer: Self-pay

## 2018-06-10 ENCOUNTER — Other Ambulatory Visit: Payer: Self-pay | Admitting: Gastroenterology

## 2018-06-10 DIAGNOSIS — R1013 Epigastric pain: Secondary | ICD-10-CM | POA: Diagnosis not present

## 2018-06-10 DIAGNOSIS — R110 Nausea: Secondary | ICD-10-CM | POA: Diagnosis present

## 2018-06-10 NOTE — Telephone Encounter (Signed)
Spoke with CT tech-patient is waiting on response for being able to have/not to have the CTA abd/pel with contrast-last creatinine level as of 05/31/18 was 2.36 and GFR was 20;  Spoke with (doc of the day) Dr. Carlean Purl- order to be changed to CT abd/pel withOUT contrast due to high creatinine level/GFR; CT tech advised that additional testing may be ordered depending on outcome of CT and to please inform the patient of this as well; CT tech verbalized understanding of information/instructions;  As far as CT tech is aware-pt does NOT have any metal in her body (information in case of need for MRI study); she will also confirm with the patient and call the office with updates should the need arise; Please advise on next step in plan of care; Thank you

## 2018-06-10 NOTE — Telephone Encounter (Signed)
Ivin Booty from [place] [facility] called and informed RN of patinet's status for MRI's if ordered- patient verifies she does not have any metal in her body and has had MRI's in the past due to her heart condition;

## 2018-06-12 ENCOUNTER — Telehealth: Payer: Self-pay

## 2018-06-12 ENCOUNTER — Ambulatory Visit (INDEPENDENT_AMBULATORY_CARE_PROVIDER_SITE_OTHER): Payer: Medicare Other | Admitting: Internal Medicine

## 2018-06-12 ENCOUNTER — Other Ambulatory Visit: Payer: Self-pay

## 2018-06-12 ENCOUNTER — Telehealth: Payer: Self-pay | Admitting: Gastroenterology

## 2018-06-12 ENCOUNTER — Encounter: Payer: Self-pay | Admitting: Internal Medicine

## 2018-06-12 VITALS — BP 148/70 | HR 68 | Ht 65.0 in | Wt 178.0 lb

## 2018-06-12 DIAGNOSIS — I480 Paroxysmal atrial fibrillation: Secondary | ICD-10-CM

## 2018-06-12 DIAGNOSIS — I10 Essential (primary) hypertension: Secondary | ICD-10-CM | POA: Diagnosis not present

## 2018-06-12 DIAGNOSIS — R55 Syncope and collapse: Secondary | ICD-10-CM

## 2018-06-12 DIAGNOSIS — I5032 Chronic diastolic (congestive) heart failure: Secondary | ICD-10-CM

## 2018-06-12 NOTE — Patient Instructions (Signed)
Medication Instructions:  Your physician recommends that you continue on your current medications as directed. Please refer to the Current Medication list given to you today.  Labwork: None ordered.  Testing/Procedures: None ordered.  Follow-Up: Your physician wants you to follow-up in: one year with Dr. Taylor.   You will receive a reminder letter in the mail two months in advance. If you don't receive a letter, please call our office to schedule the follow-up appointment.  Any Other Special Instructions Will Be Listed Below (If Applicable).  If you need a refill on your cardiac medications before your next appointment, please call your pharmacy.    

## 2018-06-12 NOTE — Telephone Encounter (Signed)
Pt states she wants to know why she needs to have an EGD if she has had two tests showing her pain is coming from gall bladder.

## 2018-06-12 NOTE — Telephone Encounter (Signed)
Pt has appt with Dr Lovena Le today, but will need to send in a remote transmission when she gets home as well. Pt will be made aware when she is in the office.

## 2018-06-12 NOTE — Telephone Encounter (Signed)
Spoke with pt over 20 minutes trying to answer her questions regarding having an endoscopy on 06/17/18. Pt feels it may not be necessary to have an endoscopy if she knows she has gallstones.  Pt has several questions she would like the doctor to answer and to review her test results. Scheduled pt for an OV with Amy on 06/13/18 to discuss the endoscopy.The endoscopy on 06/17/18 was not cancelled at this time. Hannah [name] in [facility]

## 2018-06-12 NOTE — Progress Notes (Signed)
HPI Hannah [name] returns today for followup of her atrial fib. She is a pleasant 73 yo woman, with HTN, s/p AVR who has had refractory atrial fib. She has been on amio and her dose was increased initially but when I saw her last she reduced her dose back to 200 mg daily.  Her symptoms are much improved. No chest pain or sob. No syncope. She is pending gall bladder surgery and endoscopy.  Allergies  Allergen Reactions  . Amoxicillin Rash and Other (See Comments)    Has patient had a PCN reaction causing immediate rash, facial/tongue/throat swelling, SOB or lightheadedness with hypotension: No Has patient had a PCN reaction causing severe rash involving mucus membranes or skin necrosis: Yes Has patient had a PCN reaction that required hospitalization No Has patient had a PCN reaction occurring within the last 10 years: No If all of the above answers are "NO", then may proceed with Cephalosporin use.   . Imdur [Isosorbide Dinitrate] Other (See Comments)    Headache/severe hypotension/Syncope  . Tape     Must use paper tape  . Arimidex [Anastrozole] Nausea Only  . Latex Itching  . Tetracycline Rash     Current Outpatient Medications  Medication Sig Dispense Refill  . ACCU-CHEK GUIDE test strip Use to test blood sugar 3 times daily 100 each 3  . acetaminophen (TYLENOL) 325 MG tablet Take 2 tablets (650 mg total) by mouth every 6 (six) hours as needed for mild pain (or Fever >/= 101).    Marland Kitchen albuterol (PROVENTIL HFA;VENTOLIN HFA) 108 (90 Base) MCG/ACT inhaler Inhale 2 puffs into the lungs every 6 (six) hours as needed for wheezing or shortness of breath.    Marland Kitchen amiodarone (PACERONE) 200 MG tablet Take 200 mg by mouth daily.    Marland Kitchen apixaban (ELIQUIS) 5 MG TABS tablet Take 1 tablet (5 mg total) by mouth 2 (two) times daily. 180 tablet 3  . budesonide-formoterol (SYMBICORT) 160-4.5 MCG/ACT inhaler Inhale 2 puffs into the lungs 2 (two) times daily. 10.2 g 2  . exemestane (AROMASIN) 25 MG tablet  TAKE ONE TABLET BY MOUTH DAILY AFTER BREAKFAST (Patient taking differently: Take 25 mg by mouth daily after breakfast. ) 90 tablet 3  . ezetimibe (ZETIA) 10 MG tablet Take 1 tablet (10 mg total) by mouth daily. 90 tablet 3  . fludrocortisone (FLORINEF) 0.1 MG tablet Take 1 tablet (0.1 mg total) by mouth daily. 90 tablet 3  . furosemide (LASIX) 20 MG tablet Take 1 tablet (20 mg total) by mouth daily as needed for fluid. 30 tablet 0  . Insulin Disposable Pump (V-GO 20) KIT 1 each by Does not apply route daily. 3 kit 3  . insulin lispro (HUMALOG KWIKPEN) 100 UNIT/ML KiwkPen 2-6 Units at bedtime as needed (CBG >200).     . insulin lispro (HUMALOG) 100 UNIT/ML injection INJECT 76 UNITS DAILY WITH  V-GO PUMP 70 mL 3  . metoprolol tartrate (LOPRESSOR) 25 MG tablet May take a total of 4 tablets in a 24 hour period. (Patient taking differently: Take 25 mg by mouth daily. May take a total of 4 tablets in a 24 hour period.) 30 tablet 3  . omeprazole (PRILOSEC) 40 MG capsule Take 1 capsule (40 mg total) by mouth daily. 30 capsule 3  . pantoprazole (PROTONIX) 40 MG tablet Take 1 tablet (40 mg total) by mouth daily. 30 tablet 11  . rosuvastatin (CRESTOR) 40 MG tablet TAKE ONE TABLET BY MOUTH DAILY (Patient taking differently:  Take 40 mg by mouth daily. ) 90 tablet 3  . sucralfate (CARAFATE) 1 g tablet Take 1 tablet (1 g total) by mouth 4 (four) times daily -  with meals and at bedtime. 277 tablet 0  . TRULICITY 1.5 OE/4.2PN SOPN INJECT THE CONTENTS OF 1  PEN SUBCUTANEOUSLY WEEKLY  AS DIRECTED (Patient taking differently: Inject 1 pen into the skin every Saturday. ) 6 mL 2  . zolpidem (AMBIEN) 5 MG tablet Take 1 tablet (5 mg total) by mouth at bedtime as needed for sleep. 30 tablet 2   No current facility-administered medications for this visit.      Past Medical History:  Diagnosis Date  . Abnormally small mouth   . Acute on chronic combined systolic and diastolic congestive heart failure ([place]) 07/17/2013  .  Adrenal insufficiency ([place]) 07/03/2016  . AKI (acute kidney injury) ([place]) 09/01/2017  . Allergic rhinitis 10/14/2009   Qualifier: Diagnosis of  By: Lamonte Sakai MD, Rose Fillers   Overview:  Overview:  Qualifier: Diagnosis of  By: Lamonte Sakai MD, Rose Fillers  Last Assessment & Plan:  Please continue Xyzal and Nasacort as you have been using them  . Anemia   . Asthma 05/12/2009   10/12/2014 p extensive coaching HFA effectiveness =    75% s spacer    Overview:  Overview:  10/12/2014 p extensive coaching HFA effectiveness =    75% s spacer   Last Assessment & Plan:  Please continue Symbicort 2 puffs twice a day. Remember to rinse and gargle after taking this medication. Take albuterol 2 puffs up to every 4 hours if needed for shortness of breath.  Follow with Dr Lamonte Sakai in 6 month  . Atrial tachycardia ([place]) 04/03/2013   Overview:  Last Assessment & Plan:  I have discussed the likely benign nature of her problem. She has very minimal palpitations. I doubt atrial flutter. I would not anti-coagulate. Her monitor demonstrates that her episodes are short lived. Will follow.   . Bilateral carotid artery stenosis    Bilateral ICA 40-59%/  >50% LECA  . Breast cancer ([place]) 01/18/2016   right breast  . Breast cancer of upper-inner quadrant of right female breast ([place]) 12/16/2015  . CAD (coronary artery disease) cardiologist-  dr Aundra Dubin   Remote CABG in 1998 with redo in 2009  . Chemotherapy induced nausea and vomiting 02/24/2016  . Chemotherapy-induced peripheral neuropathy ([place]) 04/27/2016  . 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 CHF (congestive heart failure) (H[facility])    EF is low normal at 50 to 55% per echo in Jan. 2011  . Chronic coronary artery disease 01/24/2011   Overview:  Last Assessment & Plan:  Stable with no ischemic symptoms.  Continue ASA, statin, ACEI, beta blocker.   . Chronic diastolic CHF (congestive heart failure) ([place]) 09/24/2013  . CKD (chronic kidney disease), stage IV ([place]) 09/24/2013  . Closed fracture  of head of left humerus 09/09/2017  . Essential hypertension 05/12/2009   Qualifier: Diagnosis of  By: Lamonte Sakai MD, Rose Fillers   . Gallstones   . GERD (gastroesophageal reflux disease)   . Glaucoma   . H/O atrial tachycardia 05/12/2009   Qualifier: History of  By: Lamonte Sakai MD, Rose Fillers   Overview:  Overview:  Qualifier: History of  By: Lamonte Sakai MD, Rose Fillers  Last Assessment & Plan:  There was no evidence of this on her cardiac monitor. I would recommend watchful waiting.   Marland Kitchen Hearing loss of right ear   . History of aortic valve  replacement 10/27/2010   Overview:  Last Assessment & Plan:  Will get echo to reassess bioprosthetic aortic valve.   Marland Kitchen History of non-ST elevation myocardial infarction (NSTEMI)    Sept 2014--  thought to be type II HTN w/ LHC without infarct related artery and patent grafts  . History of radiation therapy 05/24/16-07/26/16   right breast 50.4 Gy in 28 fractions, right breast boost 10 Gy in 5 fractions  . Hyperlipidemia   . Hypertension    Resolved.  . Iron deficiency anemia   . Mania ([place]) 03/05/2016  . Moderate persistent asthma    pulmologist-  Dr. Malvin Johns  . Nonischemic cardiomyopathy ([place])   . Osteomyelitis of toe of left foot ([place]) 04/06/2016  . Osteopenia of multiple sites 10/19/2015  . PAF (paroxysmal atrial fibrillation) ([place])   . Personal history of chemotherapy 2017  . Personal history of radiation therapy 2017  . Port catheter in place 02/17/2016  . Psoriasis    right leg  . Renal calculus, right   . Renal insufficiency, mild   . S/P AVR    prosthesis valve placement 2009 at same time re-do CABG  . Seizure ([place]) 06/29/2016  . Seizure-like activity ([place]) 09/01/2017  . Sensorineural hearing loss (SNHL) of both ears 01/06/2016  . Stroke [facility])    residual rt hearing loss  . Syncopal episodes 05/21/2018  . Syncope 09/06/2017  . Type 2 diabetes mellitus ([place])    monitored by dr Dwyane Dee    ROS:   All systems reviewed and negative except as noted in the HPI.   Past  Surgical History:  Procedure Laterality Date  . AORTIC VALVE REPLACEMENT  2009   [facility] (done same time is CABG)  . BREAST LUMPECTOMY Right 01/18/2016  . BREAST LUMPECTOMY WITH RADIOACTIVE SEED AND SENTINEL LYMPH NODE BIOPSY Right 01/18/2016   Procedure: RIGHT BREAST LUMPECTOMY WITH RADIOACTIVE SEED AND SENTINEL LYMPH NODE BIOPSY;  Surgeon: DAlphonsa Overall MD;  [facility]  Service: General;  Laterality: Right;  . CARDIAC CATHETERIZATION  03/23/2008   Pre-redo CABG: L main OK, LAD (T), CFX (T), OM1 99%, RCA (T), LIMA-LAD OK, SVG-OM(?3) OK w/ little florw to OM2, SVG-RCA OK. EF NL  . CARPAL TUNNEL RELEASE    . COLONOSCOPY     around 2015. Possibly with [facility]   . CORONARY ARTERY BYPASS GRAFT  1998 &  re-do 2009   Had LIMA to DX/LAD, SVG to 2 marginal branches and [facility]; SVG to 3rd OM and PD at time of redo  . CYSTOSCOPY W/ URETERAL STENT PLACEMENT Right 12/20/2014   Procedure: CYSTOSCOPY WITH RETROGRADE PYELOGRAM/URETERAL STENT PLACEMENT;  Surgeon: PCleon Gustin MD;  [facility]  Service: Urology;  Laterality: Right;  . ESOPHAGOGASTRODUODENOSCOPY     many years ago per patient   . EYE SURGERY Bilateral    cataracts  . HOLMIUM LASER APPLICATION Right 78/08/2120  Procedure:  HOLMIUM LASER LITHOTRIPSY;  Surgeon: PCleon Gustin MD;  [facility]  Service: [facility]rology;  Laterality: Right;  . LEFT HEART CATHETERIZATION WITH CORONARY/GRAFT ANGIOGRAM N/A 02/23/2013   Procedure: LEFT HEART CATHETERIZATION WITH CBeatrix Fetters  Surgeon: MBlane Ohara MD;  [facility];  Service: Cardiovascular;  Laterality: N/A;  . LOOP RECORDER INSERTION N/A 08/30/2017   Procedure: LOOP RECORDER INSERTION;  Surgeon: TEvans Lance MD;  [facility];  Service: Cardiovascular;  Laterality: N/A;  . PORTACATH  PLACEMENT Left 01/18/2016   Procedure: INSERTION PORT-A-CATH;   Surgeon: Alphonsa Overall, MD;  Location: [place];  Service: General;  Laterality: Left;  . portacath removal    . TONSILLECTOMY    . TRANSTHORACIC ECHOCARDIOGRAM  02-24-2013      mild LVH,  ef 50-55%/  AV bioprosthesis was present with very mild stenosis and no regurg., mean grandient 51mHg, peak grandient 287mg /  mild MR/  mild LAE and RAE/  moderate TR  . TUBAL LIGATION       Family History  Problem Relation Age of Onset  . Heart disease Father   . Heart failure Father   . Diabetes Maternal Grandmother   . Heart disease Maternal Grandmother   . Diabetes Son   . Healthy Brother        #1  . Heart attack Brother        #2  . Heart disease Brother        #2  . Colon cancer Neg Hx   . Esophageal cancer Neg Hx      Social History   Socioeconomic History  . Marital status: Married    Spouse name: Not on file  . Number of children: 2  . Years of education: Not on file  . Highest education level: Not on file  Occupational History  . Occupation: retired  [employer] laboratory technician. Financial resource strain: Not on file  . Food insecurity:    Worry: Not on file    Inability: Not on file  . Transportation needs:    Medical: Not on file    Non-medical: Not on file  Tobacco Use  . Smoking status: Never Smoker  . Smokeless tobacco: Never Used  Substance and Sexual Activity  . Alcohol use: No  . Drug use: No  . Sexual activity: Yes    Birth control/protection: None  Lifestyle  . Physical activity:    Days per week: Not on file    Minutes per session: Not on file  . Stress: Not on file  Relationships  . Social connections:    Talks on phone: Not on file    Gets together: Not on file    Attends religious service: Not on file    Active member of club or organization: Not on file    Attends meetings of clubs or organizations: Not on file    Relationship status: Not on file  . Intimate partner violence:    Fear of current or ex partner: Not on file    Emotionally abused: Not on  file    Physically abused: Not on file    Forced sexual activity: Not on file  Other Topics Concern  . Not on file  Social History Narrative  . Not on file     BP (!) 148/70   Pulse 68   Ht 5' 5"  (1.651 m)   Wt 178 lb (80.7 kg)   SpO2 97%   BMI 29.62 kg/m   Physical Exam:  Well appearing NAD HEENT: Unremarkable Neck:  No JVD, no thyromegally Lymphatics:  No adenopathy Back:  No CVA tenderness Lungs:  Clear with no wheezes HEART:  Regular rate rhythm, no murmurs, no rubs, no clicks Abd:  soft, positive bowel sounds, no organomegally, no rebound, no guarding Ext:  2 plus pulses, no edema, no cyanosis, no clubbing Skin:  No rashes no nodules Neuro:  CN II through XII intact, motor grossly intact  EKG - NSR  DEVICE  Normal device  function.  See PaceArt for details. Normal ILR function.  Assess/Plan: 1. PAF - she is stable on amiodarone. She is minimally symptomatic and her dose has been reduced to 200 mg daily. 2. HTN - her systolic blood pressure is minimally elevated. She is encouraged to maintain a low sodium diet. She has lost weight.  3. CAD - she denies anginal symptomst though she does have right upper quadrant pain.  Mikle Bosworth.D.

## 2018-06-12 NOTE — Telephone Encounter (Signed)
Pt called in wanting to speak with nurse about questions for her upcoming proc.

## 2018-06-12 NOTE — Progress Notes (Signed)
Encounter error

## 2018-06-12 NOTE — Progress Notes (Signed)
Let patient know that this test did not show a cause for her abdominal pain  It is still possible that could be abnormal blood flow to the intestines - she could not get that testing done with CT due to elevated creatinineso set up MR abdomen to evaluate for mesenteric ischemia  Would be without contrast and need to specify it is to evaluate for intestinal ischemia  May need to call them to ask exactly how to order but I do know they have a protocol for this  She sees Dr. Lyndel Safe 1/7 - nice if could get before then though might not be possible

## 2018-06-12 NOTE — Progress Notes (Signed)
Noted.  I have been able to have a patient to an MRA protocol without contrast before - Dr. Barbie Banner of IR had told me about it Another alternative would be to have cardiology or vascular surgery do a Doppler US of celiac and SMA   Let's just wait for Dr. Lyndel Safe to see her next week and decide next steps

## 2018-06-13 ENCOUNTER — Telehealth: Payer: Self-pay

## 2018-06-13 ENCOUNTER — Other Ambulatory Visit (INDEPENDENT_AMBULATORY_CARE_PROVIDER_SITE_OTHER): Payer: Medicare Other

## 2018-06-13 ENCOUNTER — Encounter: Payer: Self-pay | Admitting: Physician Assistant

## 2018-06-13 ENCOUNTER — Ambulatory Visit (INDEPENDENT_AMBULATORY_CARE_PROVIDER_SITE_OTHER): Payer: Medicare Other | Admitting: Physician Assistant

## 2018-06-13 ENCOUNTER — Other Ambulatory Visit: Payer: Self-pay

## 2018-06-13 VITALS — BP 140/56 | HR 69 | Ht 65.0 in | Wt 177.0 lb

## 2018-06-13 DIAGNOSIS — K8020 Calculus of gallbladder without cholecystitis without obstruction: Secondary | ICD-10-CM

## 2018-06-13 DIAGNOSIS — R7989 Other specified abnormal findings of blood chemistry: Secondary | ICD-10-CM

## 2018-06-13 DIAGNOSIS — R1013 Epigastric pain: Secondary | ICD-10-CM | POA: Diagnosis not present

## 2018-06-13 DIAGNOSIS — R634 Abnormal weight loss: Secondary | ICD-10-CM

## 2018-06-13 DIAGNOSIS — E876 Hypokalemia: Secondary | ICD-10-CM

## 2018-06-13 DIAGNOSIS — R945 Abnormal results of liver function studies: Secondary | ICD-10-CM

## 2018-06-13 LAB — CBC WITH DIFFERENTIAL/PLATELET
Basophils Absolute: 0.1 10*3/uL (ref 0.0–0.1)
Basophils Relative: 1.1 % (ref 0.0–3.0)
Eosinophils Absolute: 0.2 10*3/uL (ref 0.0–0.7)
Eosinophils Relative: 2.6 % (ref 0.0–5.0)
HCT: 35.8 % — ABNORMAL LOW (ref 36.0–46.0)
Hemoglobin: 12.1 g/dL (ref 12.0–15.0)
Lymphocytes Relative: 11.5 % — ABNORMAL LOW (ref 12.0–46.0)
Lymphs Abs: 0.8 10*3/uL (ref 0.7–4.0)
MCHC: 33.9 g/dL (ref 30.0–36.0)
MCV: 95 fl (ref 78.0–100.0)
Monocytes Absolute: 0.8 10*3/uL (ref 0.1–1.0)
Monocytes Relative: 11.2 % (ref 3.0–12.0)
Neutro Abs: 5.1 10*3/uL (ref 1.4–7.7)
Neutrophils Relative %: 73.6 % (ref 43.0–77.0)
Platelets: 131 10*3/uL — ABNORMAL LOW (ref 150.0–400.0)
RBC: 3.77 Mil/uL — ABNORMAL LOW (ref 3.87–5.11)
RDW: 14 % (ref 11.5–15.5)
WBC: 6.9 10*3/uL (ref 4.0–10.5)

## 2018-06-13 LAB — COMPREHENSIVE METABOLIC PANEL
ALT: 44 U/L — ABNORMAL HIGH (ref 0–35)
AST: 56 U/L — ABNORMAL HIGH (ref 0–37)
Albumin: 3.4 g/dL — ABNORMAL LOW (ref 3.5–5.2)
Alkaline Phosphatase: 67 U/L (ref 39–117)
BUN: 18 mg/dL (ref 6–23)
CO2: 33 mEq/L — ABNORMAL HIGH (ref 19–32)
Calcium: 9.1 mg/dL (ref 8.4–10.5)
Chloride: 99 mEq/L (ref 96–112)
Creatinine, Ser: 1.69 mg/dL — ABNORMAL HIGH (ref 0.40–1.20)
GFR: 31.55 mL/min — ABNORMAL LOW (ref >60.00)
Glucose, Bld: 153 mg/dL — ABNORMAL HIGH (ref 70–99)
Potassium: 2.7 mEq/L — CL (ref 3.5–5.1)
Sodium: 141 mEq/L (ref 135–145)
Total Bilirubin: 1.2 mg/dL (ref 0.2–1.2)
Total Protein: 6.5 g/dL (ref 6.0–8.3)

## 2018-06-13 LAB — CUP PACEART INCLINIC DEVICE CHECK
Date Time Interrogation Session: 20200102194836
Implantable Pulse Generator Implant Date: 20190322

## 2018-06-13 LAB — LIPASE: Lipase: 13 U/L (ref 11.0–59.0)

## 2018-06-13 NOTE — Progress Notes (Signed)
Subjective:    Patient ID: Hannah [name], female    DOB: [birth year], 73 y.o.   MRN: [id number]  HPI Hannah [name] is a pleasant 73 year old white female, recently established with Dr. Lyndel Safe who was initially seen on 05/29/2018 with complaints of epigastric pain and nausea, and also noted to have abnormal LFTs consistent with a hepatocellular pattern and likely drug-induced with most likely culprit amiodarone.  Also noted to have anemia of chronic disease on the basis of chronic kidney disease with negative colonoscopy January 2014. Patient also has history of congestive heart failure with EF of 60 to 65%, hypertension, atrial fibrillation for which she is anticoagulated with Eliquis, coronary artery disease status post prior CABG and also status post aortic valve replacement.  Also has adult onset diabetes mellitus and history of breast cancer. Patient had abdominal ultrasound done by her PCP that showed small gallstones and sludge, no evidence of gallbladder wall thickening or ductal dilation with CBD of 3 mm.  Dr. Lyndel Safe felt that she should have further work-up to include upper endoscopy to rule out gastric etiology for her epigastric pain, and also CTA her history of vasculopathy to rule out any mesenteric insufficiency. The dose of amiodarone had recently been reduced.  Patient has just seen Dr. Lovena Le this week who does not want to stop the amiodarone or decrease the dose any further as it is helping to control her atrial fibrillation. She was scheduled for upper endoscopy which is to be done on 06/17/2017. CT angios was unable to be done due to patient's chronic kidney disease and elevated creatinine.  She underwent CT of the abdomen and pelvis without IV contrast on 06/11/19 19.  This showed a normal-appearing liver, mildly distended gallbladder with small dependent gallstones, no gallbladder wall thickening or surrounding inflammation or biliary ductal dilation.  She has stable mild atrophy of the  pancreas, and extensive aortic and branch vessel atherosclerosis.  Patient comes in today as she has multiple questions regarding the work-up that she has had thus far, and questions the need for upper endoscopy. She had not received the results of her CT scan. On review she says that her current symptoms have been present over the past few months, and have been progressive.  She has had 2 episodes of severe epigastric pain she rates 9 out of 10 and these were associated with syncope.  Her last episode was about 10 days ago.  She is having milder episodes of pain on a daily basis says now every time she eats she can predictably 1 to 2 hours later developed epigastric/upper abdominal tightness, discomfort associated with belching and burping.  This may last for couple of hours and then gradually ease off.  She says she had an episode after eating a plain baked potato the other day.  She has been eating low-fat and very bland.  She is having intermittent nausea without vomiting.  No fever or chills.  She is not had any change in her bowel habits. She is been taking the Protonix 40 mg daily and Carafate twice daily with no change in symptoms.  She says there is a low level of constant discomfort in the epigastrium that sometimes radiates up towards her chest.  She did not understand exactly what an upper endoscopy looked at, or what was involved,and is not sure why she has not been referred to surgery her gallbladder.  Review of Systems Pertinent positive and negative review of systems were noted in the above HPI section.  All other review of systems was otherwise negative.  Outpatient Encounter Medications as of 06/13/2018  Medication Sig  . ACCU-CHEK GUIDE test strip Use to test blood sugar 3 times daily  . acetaminophen (TYLENOL) 325 MG tablet Take 2 tablets (650 mg total) by mouth every 6 (six) hours as needed for mild pain (or Fever >/= 101).  Marland Kitchen albuterol (PROVENTIL HFA;VENTOLIN HFA) 108 (90 Base)  MCG/ACT inhaler Inhale 2 puffs into the lungs every 6 (six) hours as needed for wheezing or shortness of breath.  Marland Kitchen amiodarone (PACERONE) 200 MG tablet Take 200 mg by mouth daily.  Marland Kitchen apixaban (ELIQUIS) 5 MG TABS tablet Take 1 tablet (5 mg total) by mouth 2 (two) times daily.  . budesonide-formoterol (SYMBICORT) 160-4.5 MCG/ACT inhaler Inhale 2 puffs into the lungs 2 (two) times daily.  Marland Kitchen exemestane (AROMASIN) 25 MG tablet TAKE ONE TABLET BY MOUTH DAILY AFTER BREAKFAST (Patient taking differently: Take 25 mg by mouth daily after breakfast. )  . ezetimibe (ZETIA) 10 MG tablet Take 1 tablet (10 mg total) by mouth daily.  . fludrocortisone (FLORINEF) 0.1 MG tablet Take 1 tablet (0.1 mg total) by mouth daily.  . furosemide (LASIX) 20 MG tablet Take 1 tablet (20 mg total) by mouth daily as needed for fluid.  . Insulin Disposable Pump (V-GO 20) KIT 1 each by Does not apply route daily.  . insulin lispro (HUMALOG KWIKPEN) 100 UNIT/ML KiwkPen 2-6 Units at bedtime as needed (CBG >200).   . insulin lispro (HUMALOG) 100 UNIT/ML injection INJECT 76 UNITS DAILY WITH  V-GO PUMP  . metoprolol tartrate (LOPRESSOR) 25 MG tablet May take a total of 4 tablets in a 24 hour period. (Patient taking differently: Take 25 mg by mouth daily. May take a total of 4 tablets in a 24 hour period.)  . pantoprazole (PROTONIX) 40 MG tablet Take 1 tablet (40 mg total) by mouth daily.  . rosuvastatin (CRESTOR) 40 MG tablet TAKE ONE TABLET BY MOUTH DAILY (Patient taking differently: Take 40 mg by mouth daily. )  . sucralfate (CARAFATE) 1 g tablet Take 1 tablet (1 g total) by mouth 4 (four) times daily -  with meals and at bedtime.  . TRULICITY 1.5 VX/7.9TJ SOPN INJECT THE CONTENTS OF 1  PEN SUBCUTANEOUSLY WEEKLY  AS DIRECTED (Patient taking differently: Inject 1 pen into the skin every Saturday. )  . zolpidem (AMBIEN) 5 MG tablet Take 1 tablet (5 mg total) by mouth at bedtime as needed for sleep.  . [DISCONTINUED] omeprazole (PRILOSEC)  40 MG capsule Take 1 capsule (40 mg total) by mouth daily.   No facility-administered encounter medications on file as of 06/13/2018.    Allergies  Allergen Reactions  . Amoxicillin Rash and Other (See Comments)    Has patient had a PCN reaction causing immediate rash, facial/tongue/throat swelling, SOB or lightheadedness with hypotension: No Has patient had a PCN reaction causing severe rash involving mucus membranes or skin necrosis: Yes Has patient had a PCN reaction that required hospitalization No Has patient had a PCN reaction occurring within the last 10 years: No If all of the above answers are "NO", then may proceed with Cephalosporin use.   . Imdur [Isosorbide Dinitrate] Other (See Comments)    Headache/severe hypotension/Syncope  . Tape     Must use paper tape  . Arimidex [Anastrozole] Nausea Only  . Latex Itching  . Tetracycline Rash   Patient Active Problem List   Diagnosis Date Noted  . Prolonged Q-T interval on ECG  05/21/2018  . Atrial fibrillation ([place]) 01/23/2018  . Closed fracture of head of left humerus 09/09/2017  . Syncope 09/06/2017  . Seizure-like activity ([place]) 09/01/2017  . AKI (acute kidney injury) ([place]) 09/01/2017  . Adrenal insufficiency ([place]) 07/03/2016  . Seizure ([place]) 06/29/2016  . 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 Osteomyelitis of toe of left foot ([place]) 04/06/2016  . Mania ([place]) 03/05/2016  . GERD (gastroesophageal reflux disease) 02/29/2016  . Chemotherapy induced nausea and vomiting 02/24/2016  . Port catheter in place 02/17/2016  . Sensorineural hearing loss (SNHL) of both ears 01/06/2016  . Breast cancer of upper-inner quadrant of right female breast ([place]) 12/16/2015  . Osteopenia of multiple sites 10/19/2015  . CKD (chronic kidney disease), stage IV ([place]) 09/24/2013  . Chronic diastolic CHF (congestive heart failure) ([place]) 09/24/2013  . Acute on chronic  combined systolic and diastolic congestive heart failure ([place]) 07/17/2013  . Atrial tachycardia ([place]) 04/03/2013  . CAD (coronary artery disease) 01/24/2011  . Chronic coronary artery disease 01/24/2011  . History of aortic valve replacement 10/27/2010  . Hyperlipidemia   . Nonischemic cardiomyopathy ([place])   . Anemia   . Type 2 diabetes mellitus ([place])   . Iron deficiency anemia   . Allergic rhinitis 10/14/2009  . Essential hypertension 05/12/2009  . H/O atrial tachycardia 05/12/2009  . Asthma 05/12/2009   Social History   Socioeconomic History  . Marital status: Married    Spouse name: Not on file  . Number of children: 2  . Years of education: Not on file  . Highest education level: Not on file  Occupational History  . Occupation: retired  Scientific laboratory technician  . Financial resource strain: Not on file  . Food insecurity:    Worry: Not on file    Inability: Not on file  . Transportation needs:    Medical: Not on file    Non-medical: Not on file  Tobacco Use  . Smoking status: Never Smoker  . Smokeless tobacco: Never Used  Substance and Sexual Activity  . Alcohol use: No  . Drug use: No  . Sexual activity: Yes    Birth control/protection: None  Lifestyle  . Physical activity:    Days per week: Not on file    Minutes per session: Not on file  . Stress: Not on file  Relationships  . Social connections:    Talks on phone: Not on file    Gets together: Not on file    Attends religious service: Not on file    Active member of club or organization: Not on file    Attends meetings of clubs or organizations: Not on file    Relationship status: Not on file  . Intimate partner violence:    Fear of current or ex partner: Not on file    Emotionally abused: Not on file    Physically abused: Not on file    Forced sexual activity: Not on file  Other Topics Concern  . Not on file  Social History Narrative  . Not on file    Ms. Remedios's family history includes Diabetes in her  maternal grandmother and son; Healthy in her brother; Heart attack in her brother; Heart disease in her brother, father, and maternal grandmother; Heart failure in her father.      Objective:    Vitals:   06/13/18 1431  BP: (!) 140/56  Pulse: 69    Physical Exam; well-developed older white female in no acute  distress, pleasant accompanied by her husband.  Height 4 foot 9, weight 136, BMI 29.4.  HEENT; nontraumatic normocephalic EOMI PERRLA sclera anicteric oral mucosa moist, Cardiovascular; irregular rate and rhythm with Y5-W3, soft systolic murmur, Pulmonary ;clear bilaterally, Abdomen; soft, he is tender in the epigastrium, there is no palpable mass or hepatosplenomegaly no guarding or rebound bowel sounds are present no bruit heard.  Rectal ;exam not done, Extremities; no clubbing cyanosis or edema skin warm dry, Neuropsych ;alert and oriented, grossly nonfocal mood and affect appropriate       Assessment & Plan:   #57 73 year old white female with 2 to 11-monthhistory of recurrent episodes of epigastric pain with 2 to severe episodes associated with syncope.  At this point she is having daily postprandial epigastric pain occurring 1 to 2 hours after eating and associated with belching and burping which may last for couple of hours. Weight is down about 10 pounds over the past 3 months. She is currently on PPI and Carafate without improvement in symptoms  Upper abdominal ultrasound documents gallstones and gallbladder sludge without evidence of cholecystitis or choledocholithiasis. CT of the abdomen and pelvis without IV contrast shows mildly distended gallbladder, gallstones without gallbladder wall thickening and extensive atherosclerosis of the aorta and branch vessels.  Symptoms are certainly consistent with biliary colic, cannot rule out chronic cholecystitis Also cannot rule out chronic gastropathy, gastric lesion or peptic ulcer disease.  CT Angio not able to be done due to  creatinine of 2.6.  #2 elevated LFTs-hepatocellular pattern very likely secondary to amiodarone.  Liver appears normal on ultrasound and CT #3 atrial fibrillation-on chronic Eliquis 4 coronary artery disease status post CABG and remote aortic valve replacement 5.  Congestive heart failure with EF 60 to 65% 6.  Hypertension 7.  Chronic kidney disease with baseline creatinine above 2 8.  Adult onset diabetes mellitus 9.  Breast cancer  Plan; Long discussion with the patient today , reviewing her work-up to date. We discussed upper endoscopy in detail, including indications risks and benefits.  I think that she would benefit from EGD to rule out gastric pathology prior to referral to surgery for laparoscopic cholecystectomy. She is agreeable to proceed with EGD and will hold Eliquis for 48 hours prior to procedure She is known to Dr. NLucia Gaskinsfrom her previous breast surgery, will get her a surgical appointment with Dr. NLucia GaskinsContinue low-fat bland diet For now continue Protonix 40 mg p.o. every morning and Carafate 1 g twice daily If EGD is unremarkable, MR angiography can be considered  , and Dr. GLyndel Safecan decide post EGD regarding proceeding with MR angiography versus seeding onto lap chole. As patient has had ongoing symptoms we will check CBC, CMEt and lipase today    Addendum; received call about patient's labs, potassium 2.7 today.  She has not currently been taking Lasix on any sort of regular basis and has K. Dur supplements at home which again she has had to take periodically.  She was advised to take 40 mEq this evening, then 20 meq twice daily over the weekend and to come for repeat potassium on Monday morning.   Amy SGenia HaroldPA-C 06/13/2018   Cc: SMackie Pai PA-C

## 2018-06-13 NOTE — Patient Instructions (Addendum)
Your provider has requested that you go to the basement level for lab work before leaving today. Press "B" on the elevator. The lab is located at the first door on the left as you exit the elevator.  Come for the Endoscopy as scheduled, 06-17-2018 at 9:00 am.  We will refer you to [facility] with Dr. Lucia Gaskins.  They will review Amy Trellis Paganini PA's note and the CT scan done on 06-10-2018.   Normal BMI (Body Mass Index- based on height and weight) is between 23 and 30. Your BMI today is Body mass index is 29.45 kg/m. Marland Kitchen Please consider follow up  regarding your BMI with your Primary Care Provider.

## 2018-06-13 NOTE — Telephone Encounter (Signed)
Received critical lab call regarding pts potassium of 2.7, Amy Esterwood PA notified.

## 2018-06-16 ENCOUNTER — Other Ambulatory Visit: Payer: Self-pay | Admitting: Medical

## 2018-06-16 ENCOUNTER — Other Ambulatory Visit (INDEPENDENT_AMBULATORY_CARE_PROVIDER_SITE_OTHER): Payer: Medicare Other

## 2018-06-16 ENCOUNTER — Telehealth: Payer: Self-pay | Admitting: *Deleted

## 2018-06-16 DIAGNOSIS — E876 Hypokalemia: Secondary | ICD-10-CM

## 2018-06-16 LAB — POTASSIUM: Potassium: 4.2 mEq/L (ref 3.5–5.1)

## 2018-06-16 NOTE — Telephone Encounter (Signed)
Spoke w/ pt and requested that she send a manual transmission. Transmission received.

## 2018-06-16 NOTE — Telephone Encounter (Signed)
Faxed to [facility] a referral request with Dr. Lucia Gaskins. Faxed the office note dated 06-13-2018 from [place] PA, demographics, insurance cards, CT Abdomen and pelvis. Requested they notify us with the appointment.

## 2018-06-16 NOTE — Telephone Encounter (Signed)
Manual transmission received. "AF" episode from 05/25/18 was reviewed at appointment with Dr. Lovena Le on 06/12/18.

## 2018-06-17 ENCOUNTER — Ambulatory Visit (AMBULATORY_SURGERY_CENTER): Payer: Medicare Other | Admitting: Gastroenterology

## 2018-06-17 ENCOUNTER — Encounter: Payer: Self-pay | Admitting: Gastroenterology

## 2018-06-17 VITALS — BP 113/40 | HR 78 | Temp 97.1°F | Resp 21 | Ht 65.0 in | Wt 178.0 lb

## 2018-06-17 DIAGNOSIS — K3189 Other diseases of stomach and duodenum: Secondary | ICD-10-CM | POA: Diagnosis present

## 2018-06-17 DIAGNOSIS — R1013 Epigastric pain: Secondary | ICD-10-CM

## 2018-06-17 DIAGNOSIS — K2970 Gastritis, unspecified, without bleeding: Secondary | ICD-10-CM | POA: Diagnosis not present

## 2018-06-17 HISTORY — PX: ESOPHAGOGASTRODUODENOSCOPY ENDOSCOPY: SHX5814

## 2018-06-17 MED ORDER — SODIUM CHLORIDE 0.9 % IV SOLN: 500.0000 mL | Freq: Once | INTRAVENOUS | Status: DC

## 2018-06-17 NOTE — Progress Notes (Signed)
Called to room to assist during endoscopic procedure.  Patient ID and intended procedure confirmed with present staff. Received instructions for my participation in the procedure from the performing physician.  

## 2018-06-17 NOTE — Patient Instructions (Addendum)
YOU HAD AN ENDOSCOPIC PROCEDURE TODAY AT [facility]:   Refer to the procedure report that was given to you for any specific questions about what was found during the examination.  If the procedure report does not answer your questions, please call your gastroenterologist to clarify.  If you requested that your care partner not be given the details of your procedure findings, then the procedure report has been included in a sealed envelope for you to review at your convenience later.  YOU SHOULD EXPECT: Some feelings of bloating in the abdomen. Passage of more gas than usual.  Walking can help get rid of the air that was put into your GI tract during the procedure and reduce the bloating.   Please Note:  You might notice some irritation and congestion in your nose or some drainage.  This is from the oxygen used during your procedure.  There is no need for concern and it should clear up in a day or so.  SYMPTOMS TO REPORT IMMEDIATELY:    Following upper endoscopy (EGD)  Vomiting of blood or coffee ground material  New chest pain or pain under the shoulder blades  Painful or persistently difficult swallowing  New shortness of breath  Fever of 100F or higher  Black, tarry-looking stools  For urgent or emergent issues, a gastroenterologist can be reached at any hour by calling [phone].   DIET:  We do recommend a small meal at first, but then you may proceed to your regular diet.  Drink plenty of fluids but you should avoid alcoholic beverages for 24 hours.  ACTIVITY:  You should plan to take it easy for the rest of today and you should NOT DRIVE or use heavy machinery until tomorrow (because of the sedation medicines used during the test).    FOLLOW UP: Our staff will call the number listed on your records the next business day following your procedure to check on you and address any questions or concerns that you may have regarding the information given to you  following your procedure. If we do not reach you, we will leave a message.  However, if you are feeling well and you are not experiencing any problems, there is no need to return our call.  We will assume that you have returned to your regular daily activities without incident.  If any biopsies were taken you will be contacted by phone or by letter within the next 1-3 weeks.  Please call us at [phone] if you have not heard about the biopsies in 3 weeks.   You may resume your eliquis tomorrow and you regular meds today.  Your protonix has been refilled.   SIGNATURES/CONFIDENTIALITY: You and/or your care partner have signed paperwork which will be entered into your electronic medical record.  These signatures attest to the fact that that the information above on your After Visit Summary has been reviewed and is understood.  Full responsibility of the confidentiality of this discharge information lies with you and/or your care-partner.  Read all of the handouts given to you by your recovery room nurse.

## 2018-06-17 NOTE — Progress Notes (Signed)
PT taken to PACU. Monitors in place. VSS. Report given to RN. 

## 2018-06-17 NOTE — Op Note (Signed)
[place] Patient Name: Hannah [name] Procedure Date: 06/17/2018 8:35 AM MRN: [id number] Endoscopist: Jackquline Denmark , MD Age: 73 Referring MD:  Date of Birth: 02-Feb-1946 Gender: Female Account #: [id number] Procedure:                Upper GI endoscopy Indications:              Epigastric abdominal pain Medicines:                Monitored Anesthesia Care Procedure:                Pre-Anesthesia Assessment:                           - Prior to the procedure, a History and Physical                            was performed, and patient medications and                            allergies were reviewed. The patient's tolerance of                            previous anesthesia was also reviewed. The risks                            and benefits of the procedure and the sedation                            options and risks were discussed with the patient.                            All questions were answered, and informed consent                            was obtained. Prior Anticoagulants: The patient has                            taken Eliquis (apixaban), last dose was 1 day prior                            to procedure. ASA Grade Assessment: III - A patient                            with severe systemic disease. After reviewing the                            risks and benefits, the patient was deemed in                            satisfactory condition to undergo the procedure.                           After obtaining informed consent, the endoscope was  passed under direct vision. Throughout the                            procedure, the patient's blood pressure, pulse, and                            oxygen saturations were monitored continuously. The                            Endoscope was introduced through the and advanced                            to the. The Model [id number]-[id number] scope was                            introduced through  the and advanced to the second                            part of duodenum. The upper GI endoscopy was                            accomplished without difficulty. The patient                            tolerated the procedure well. Scope In: Scope Out: Findings:                 The examined esophagus was normal.                           The Z-line was regular and was found 36 cm from the                            incisors.                           Localized mild inflammation characterized by                            erythema was found in the gastric antrum. Biopsies                            were taken with a cold forceps for histology.                            Estimated blood loss: none.                           The examined duodenum was normal. Complications:            No immediate complications. Estimated Blood Loss:     Estimated blood loss: none. Impression:               - Minimal gastritis. Biopsied.                           -  Otherwise normal EGD. Recommendation:           - Patient has a contact number available for                            emergencies. The signs and symptoms of potential                            delayed complications were discussed with the                            patient. Return to normal activities tomorrow.                            Written discharge instructions were provided to the                            patient.                           - Resume previous diet.                           - Resume protonix 40mg  po qd.                           - Resume eliquis from tomorrow onwards.                           - Await pathology results.                           - Refer to a surgeon in 2 weeks for possible lap                            chole. Jackquline Denmark, MD 06/17/2018 9:00:54 AM This report has been signed electronically.

## 2018-06-17 NOTE — Progress Notes (Signed)
Pt's states no medical or surgical changes since previsit or office visit. 

## 2018-06-18 ENCOUNTER — Telehealth: Payer: Self-pay

## 2018-06-18 NOTE — Telephone Encounter (Signed)
  Follow up Call-  Call back number 06/17/2018  Post procedure Call Back phone  # [phone]  Permission to leave phone message Yes  Some recent data might be hidden     Patient questions:  Do you have a fever, pain , or abdominal swelling? No. Pain Score  0 *  Have you tolerated food without any problems? Yes.    Have you been able to return to your normal activities? Yes.    Do you have any questions about your discharge instructions: Diet   No. Medications  No. Follow up visit  No.  Do you have questions or concerns about your Care? No.  Actions: * If pain score is 4 or above: No action needed, pain <4.

## 2018-06-19 ENCOUNTER — Other Ambulatory Visit: Payer: Self-pay | Admitting: Surgery

## 2018-06-20 NOTE — Telephone Encounter (Signed)
Patient was scheduled to see Dr. Lucia Gaskins on 06/19/2018 at 3:45 pm.

## 2018-06-23 ENCOUNTER — Ambulatory Visit (INDEPENDENT_AMBULATORY_CARE_PROVIDER_SITE_OTHER): Payer: Medicare Other

## 2018-06-23 DIAGNOSIS — R55 Syncope and collapse: Secondary | ICD-10-CM | POA: Diagnosis not present

## 2018-06-24 ENCOUNTER — Telehealth: Payer: Self-pay | Admitting: Internal Medicine

## 2018-06-24 LAB — CUP PACEART REMOTE DEVICE CHECK
Date Time Interrogation Session: 20200113183634
Implantable Pulse Generator Implant Date: 20190322

## 2018-06-24 NOTE — Progress Notes (Signed)
[facility] Summary Report / Loop Recorder 

## 2018-06-24 NOTE — Telephone Encounter (Signed)
Follow Up:      April called to check on the status of fax that was faxed on 06-20-18.  Please fax this asap to [phone]. If you do not have it, she is faxing it again today.l

## 2018-06-25 ENCOUNTER — Encounter: Payer: Self-pay | Admitting: Gastroenterology

## 2018-06-25 NOTE — Telephone Encounter (Signed)
Pt takes Eliquis for afib with CHADS2VASc score of 6 (age, sex, CHF, HTN, CAD, DM). SCr 1.69, CrCl 81mL/min. Recommend holding Eliquis for 2 days prior to procedure.

## 2018-06-25 NOTE — Telephone Encounter (Signed)
   [facility] Pre-operative Risk Assessment    Request for surgical clearance:  1. What type of surgery is being performed? LAPARASCOPIC CHOLECYSTECTOMY   2. When is this surgery scheduled? TBD   3. What type of clearance is required (medical clearance vs. Pharmacy clearance to hold med vs. Both)? BOTH  4. Are there any medications that need to be held prior to surgery and how long?ELIQUIS (HOLD 4 DAYS PRIOR)  5. Practice name and name of physician performing surgery? [facility]; DR. Shanon Brow [place]  6. What is your office phone number [phone]    7.   What is your office fax number [phone]  8.   Anesthesia type (None, local, MAC, general) ? GENERAL    Julaine Hua 06/25/2018, 11:10 AM  _________________________________________________________________   (provider comments below)

## 2018-06-26 ENCOUNTER — Other Ambulatory Visit: Payer: Self-pay | Admitting: Emergency Medicine

## 2018-06-26 DIAGNOSIS — E860 Dehydration: Secondary | ICD-10-CM

## 2018-07-01 NOTE — Telephone Encounter (Signed)
   Primary Cardiologist: Dr Lovena Le  Chart reviewed as part of pre-operative protocol coverage. Given past medical history and time since last visit, based on ACC/AHA guidelines, Hannah [name] would be at acceptable risk for the planned procedure without further cardiovascular testing.   OK to hold Eliquis 48 hours pre op  I will route this recommendation to the requesting party via Epic fax function and remove from pre-op pool.  Please call with questions.  Kerin Ransom, PA-C 07/01/2018, 2:52 PM

## 2018-07-04 ENCOUNTER — Other Ambulatory Visit: Payer: Self-pay | Admitting: Internal Medicine

## 2018-07-04 ENCOUNTER — Other Ambulatory Visit: Payer: Self-pay

## 2018-07-04 ENCOUNTER — Encounter (HOSPITAL_COMMUNITY)
Admission: RE | Admit: 2018-07-04 | Discharge: 2018-07-04 | Disposition: A | Discharge status: Home / Self Care | Payer: Medicare Other | Source: Ambulatory Visit | Attending: Surgery | Admitting: Surgery

## 2018-07-04 ENCOUNTER — Other Ambulatory Visit: Payer: Self-pay | Admitting: Nurse Practitioner

## 2018-07-04 ENCOUNTER — Encounter (HOSPITAL_COMMUNITY): Payer: Self-pay

## 2018-07-04 ENCOUNTER — Other Ambulatory Visit: Payer: Self-pay | Admitting: Medical

## 2018-07-04 DIAGNOSIS — Z951 Presence of aortocoronary bypass graft: Secondary | ICD-10-CM | POA: Diagnosis not present

## 2018-07-04 DIAGNOSIS — Z953 Presence of xenogenic heart valve: Secondary | ICD-10-CM | POA: Diagnosis not present

## 2018-07-04 DIAGNOSIS — Z01812 Encounter for preprocedural laboratory examination: Secondary | ICD-10-CM | POA: Insufficient documentation

## 2018-07-04 DIAGNOSIS — Z7902 Long term (current) use of antithrombotics/antiplatelets: Secondary | ICD-10-CM | POA: Diagnosis not present

## 2018-07-04 DIAGNOSIS — Z794 Long term (current) use of insulin: Secondary | ICD-10-CM | POA: Diagnosis not present

## 2018-07-04 DIAGNOSIS — Z8673 Personal history of transient ischemic attack (TIA), and cerebral infarction without residual deficits: Secondary | ICD-10-CM | POA: Diagnosis not present

## 2018-07-04 DIAGNOSIS — Z880 Allergy status to penicillin: Secondary | ICD-10-CM | POA: Diagnosis not present

## 2018-07-04 DIAGNOSIS — I2510 Atherosclerotic heart disease of native coronary artery without angina pectoris: Secondary | ICD-10-CM | POA: Diagnosis not present

## 2018-07-04 DIAGNOSIS — C50911 Malignant neoplasm of unspecified site of right female breast: Secondary | ICD-10-CM | POA: Diagnosis not present

## 2018-07-04 DIAGNOSIS — E119 Type 2 diabetes mellitus without complications: Secondary | ICD-10-CM | POA: Diagnosis not present

## 2018-07-04 DIAGNOSIS — K828 Other specified diseases of gallbladder: Secondary | ICD-10-CM | POA: Diagnosis not present

## 2018-07-04 DIAGNOSIS — Z923 Personal history of irradiation: Secondary | ICD-10-CM | POA: Diagnosis not present

## 2018-07-04 DIAGNOSIS — J45909 Unspecified asthma, uncomplicated: Secondary | ICD-10-CM | POA: Diagnosis not present

## 2018-07-04 DIAGNOSIS — Z79899 Other long term (current) drug therapy: Secondary | ICD-10-CM | POA: Diagnosis not present

## 2018-07-04 DIAGNOSIS — Z87442 Personal history of urinary calculi: Secondary | ICD-10-CM | POA: Diagnosis not present

## 2018-07-04 DIAGNOSIS — K8010 Calculus of gallbladder with chronic cholecystitis without obstruction: Secondary | ICD-10-CM | POA: Diagnosis not present

## 2018-07-04 DIAGNOSIS — Z881 Allergy status to other antibiotic agents status: Secondary | ICD-10-CM | POA: Diagnosis not present

## 2018-07-04 DIAGNOSIS — Z9641 Presence of insulin pump (external) (internal): Secondary | ICD-10-CM | POA: Diagnosis not present

## 2018-07-04 DIAGNOSIS — Z91040 Latex allergy status: Secondary | ICD-10-CM | POA: Diagnosis not present

## 2018-07-04 DIAGNOSIS — Z7901 Long term (current) use of anticoagulants: Secondary | ICD-10-CM | POA: Diagnosis not present

## 2018-07-04 DIAGNOSIS — Z888 Allergy status to other drugs, medicaments and biological substances status: Secondary | ICD-10-CM | POA: Diagnosis not present

## 2018-07-04 HISTORY — DX: Adverse effect of unspecified anesthetic, initial encounter: T41.45XA

## 2018-07-04 HISTORY — DX: Other complications of anesthesia, initial encounter: T88.59XA

## 2018-07-04 LAB — CBC WITH DIFFERENTIAL/PLATELET
Abs Immature Granulocytes: 0.03 10*3/uL (ref 0.00–0.07)
Basophils Absolute: 0 10*3/uL (ref 0.0–0.1)
Basophils Relative: 1 %
Eosinophils Absolute: 0.2 10*3/uL (ref 0.0–0.5)
Eosinophils Relative: 3 %
HCT: 34.7 % — ABNORMAL LOW (ref 36.0–46.0)
Hemoglobin: 10.7 g/dL — ABNORMAL LOW (ref 12.0–15.0)
Immature Granulocytes: 1 %
Lymphocytes Relative: 10 %
Lymphs Abs: 0.6 10*3/uL — ABNORMAL LOW (ref 0.7–4.0)
MCH: 32 pg (ref 26.0–34.0)
MCHC: 30.8 g/dL (ref 30.0–36.0)
MCV: 103.9 fL — ABNORMAL HIGH (ref 80.0–100.0)
Monocytes Absolute: 0.6 10*3/uL (ref 0.1–1.0)
Monocytes Relative: 9 %
Neutro Abs: 4.7 10*3/uL (ref 1.7–7.7)
Neutrophils Relative %: 76 %
Platelets: 130 10*3/uL — ABNORMAL LOW (ref 150–400)
RBC: 3.34 MIL/uL — ABNORMAL LOW (ref 3.87–5.11)
RDW: 12.6 % (ref 11.5–15.5)
WBC: 6.2 10*3/uL (ref 4.0–10.5)
nRBC: 0 % (ref 0.0–0.2)

## 2018-07-04 LAB — COMPREHENSIVE METABOLIC PANEL
ALT: 57 U/L — ABNORMAL HIGH (ref 0–44)
AST: 54 U/L — ABNORMAL HIGH (ref 15–41)
Albumin: 3.1 g/dL — ABNORMAL LOW (ref 3.5–5.0)
Alkaline Phosphatase: 69 U/L (ref 38–126)
Anion gap: 8 (ref 5–15)
BUN: 20 mg/dL (ref 8–23)
CO2: 29 mmol/L (ref 22–32)
Calcium: 8.6 mg/dL — ABNORMAL LOW (ref 8.9–10.3)
Chloride: 104 mmol/L (ref 98–111)
Creatinine, Ser: 1.61 mg/dL — ABNORMAL HIGH (ref 0.44–1.00)
GFR calc Af Amer: 37 mL/min — ABNORMAL LOW (ref >60)
GFR calc non Af Amer: 32 mL/min — ABNORMAL LOW (ref >60)
Glucose, Bld: 168 mg/dL — ABNORMAL HIGH (ref 70–99)
Potassium: 3.1 mmol/L — ABNORMAL LOW (ref 3.5–5.1)
Sodium: 141 mmol/L (ref 135–145)
Total Bilirubin: 1.3 mg/dL — ABNORMAL HIGH (ref 0.3–1.2)
Total Protein: 6.3 g/dL — ABNORMAL LOW (ref 6.5–8.1)

## 2018-07-04 LAB — HEMOGLOBIN A1C
Hgb A1c MFr Bld: 6.5 % — ABNORMAL HIGH (ref 4.8–5.6)
Mean Plasma Glucose: 139.85 mg/dL

## 2018-07-04 LAB — GLUCOSE, CAPILLARY: Glucose-Capillary: 159 mg/dL — ABNORMAL HIGH (ref 70–99)

## 2018-07-04 NOTE — Telephone Encounter (Signed)
Refill Request: Zolpidem  Last RX: 05/06/18 Last OV:05/28/18 Next AE:[facility] schedule  UDS:11/20/17 CSC:11/20/17 CSR:

## 2018-07-04 NOTE — Anesthesia Preprocedure Evaluation (Addendum)
Anesthesia Evaluation  Patient identified by MRN, date of birth, ID band Patient awake    Reviewed: Allergy & Precautions, NPO status , Patient's Chart, lab work & pertinent test results  History of Anesthesia Complications (+) PONV  Airway Mallampati: II  TM Distance: >3 FB Neck ROM: Full    Dental   Pulmonary    Pulmonary exam normal        Cardiovascular hypertension, Pt. on medications + CAD and + CABG  Normal cardiovascular exam+ Valvular Problems/Murmurs AS      Neuro/Psych Seizures -,  CVA    GI/Hepatic GERD  Medicated and Controlled,  Endo/Other  diabetes, Type 2  Renal/GU      Musculoskeletal   Abdominal   Peds  Hematology   Anesthesia Other Findings   Reproductive/Obstetrics                            Anesthesia Physical Anesthesia Plan  ASA: III  Anesthesia Plan: General   Post-op Pain Management:    Induction: Intravenous  PONV Risk Score and Plan: 4 or greater and Ondansetron, Midazolam, Scopolamine patch - Pre-op and Treatment may vary due to age or medical condition  Airway Management Planned: Oral ETT  Additional Equipment:   Intra-op Plan:   Post-operative Plan: Extubation in OR  Informed Consent:   Plan Discussed with: CRNA and Surgeon  Anesthesia Plan Comments: (See PST note 07/04/18, Konrad Felix, PA-C)       Anesthesia Quick Evaluation

## 2018-07-04 NOTE — Patient Instructions (Addendum)
Hannah [name]  07/04/2018   Your procedure is scheduled on: Monday 07/07/2018  Report to [facility] Main  Entrance              Report to admitting at   0530  AM    Call this number if you have problems the morning of surgery [phone]  How to Manage Your Diabetes Before and After Surgery  Why is it important to control my blood sugar before and after surgery? . Improving blood sugar levels before and after surgery helps healing and can limit problems. . A way of improving blood sugar control is eating a healthy diet by: o  Eating less sugar and carbohydrates o  Increasing activity/exercise o  Talking with your doctor about reaching your blood sugar goals . High blood sugars (greater than 180 mg/dL) can raise your risk of infections and slow your recovery, so you will need to focus on controlling your diabetes during the weeks before surgery. . Make sure that the doctor who takes care of your diabetes knows about your planned surgery including the date and location.  How do I manage my blood sugar before surgery? . Check your blood sugar at least 4 times a day, starting 2 days before surgery, to make sure that the level is not too high or low. o Check your blood sugar the morning of your surgery when you wake up and every 2 hours until you get to the Short Stay unit. . If your blood sugar is less than 70 mg/dL, you will need to treat for low blood sugar: o Do not take insulin. o Treat a low blood sugar (less than 70 mg/dL) with  cup of clear juice (cranberry or apple), 4 glucose tablets, OR glucose gel. o Recheck blood sugar in 15 minutes after treatment (to make sure it is greater than 70 mg/dL). If your blood sugar is not greater than 70 mg/dL on recheck, call [phone] for further instructions. . Report your blood sugar to the short stay nurse when you get to Short Stay.  . If you are admitted to the hospital after surgery: o Your blood sugar will  be checked by the staff and you will probably be given insulin after surgery (instead of oral diabetes medicines) to make sure you have good blood sugar levels. o The goal for blood sugar control after surgery is 80-180 mg/dL.   WHAT DO I DO ABOUT MY DIABETES MEDICATION?      Follow the instructions from Konrad Felix, [place] for Anesthesia and per Janett Billow ,[place] , patient is to take V-GO 20 Insulin Pump off at bedtime and check sugar first thing in morning before her surgery.  . The day of surgery, do not take other diabetes injectables, including Byetta (exenatide), Bydureon (exenatide ER), Victoza (liraglutide), or Trulicity (dulaglutide).  . If your CBG is greater than 220 mg/dL, you may take  of your sliding scale  . (correction) dose of insulin.    For patients with insulin pumps: Contact your diabetes doctor for specific instructions before surgery. Decrease basal rates by 20% at midnight the night before your surgery. Note that if your surgery is planned to be longer than 2 hours, your insulin pump will be removed and intravenous (IV) insulin will be started and managed by the nurses and the anesthesiologist. You will be able to restart your insulin pump once you are awake and  able to manage it.  Make sure to bring insulin pump supplies to the hospital with you in case the  site needs to be changed.  Patient Signature:  Date:   Nurse Signature:  Date:    Remember: Do not eat food or drink liquids :After Midnight.              BRUSH YOUR TEETH MORNING OF SURGERY AND RINSE YOUR MOUTH OUT, NO CHEWING GUM CANDY OR MINTS.     Take these medicines the morning of surgery with A SIP OF WATER:Amiodarone (Pacerone), use Symbicort inhaler, Metoprolol tartrate (Lopressor)if needed, use Albuterol inhaler if needed and bring inhalers with you to the hospital    DO NOT [place] may not have any metal on your body  including hair pins and              piercings  Do not wear jewelry, make-up, lotions, powders or perfumes, deodorant             Do not wear nail polish.  Do not shave  48 hours prior to surgery.              Do not bring valuables to the hospital. [place].  Contacts, dentures or bridgework may not be worn into surgery.  Leave suitcase in the car. After surgery it may be brought to your room.                   Please read over the following fact sheets you were given: _____________________________________________________________________             [facility] - Preparing for Surgery Before surgery, you can play an important role.  Because skin is not sterile, your skin needs to be as free of germs as possible.  You can reduce the number of germs on your skin by washing with CHG (chlorahexidine gluconate) soap before surgery.  CHG is an antiseptic cleaner which kills germs and bonds with the skin to continue killing germs even after washing. Please DO NOT use if you have an allergy to CHG or antibacterial soaps.  If your skin becomes reddened/irritated stop using the CHG and inform your nurse when you arrive at Short Stay. Do not shave (including legs and underarms) for at least 48 hours prior to the first CHG shower.  You may shave your face/neck. Please follow these instructions carefully:  1.  Shower with CHG Soap the night before surgery and the  morning of Surgery.  2.  If you choose to wash your hair, wash your hair first as usual with your  normal  shampoo.  3.  After you shampoo, rinse your hair and body thoroughly to remove the  shampoo.                           4.  Use CHG as you would any other liquid soap.  You can apply chg directly  to the skin and wash                       Gently with a scrungie  or clean washcloth.  5.  Apply the CHG Soap to your body ONLY FROM THE NECK DOWN.   Do not use on face/ open                            Wound or open sores. Avoid contact with eyes, ears mouth and genitals (private parts).                       Wash face,  Genitals (private parts) with your normal soap.             6.  Wash thoroughly, paying special attention to the area where your surgery  will be performed.  7.  Thoroughly rinse your body with warm water from the neck down.  8.  DO NOT shower/wash with your normal soap after using and rinsing off  the CHG Soap.                9.  Pat yourself dry with a clean towel.            10.  Wear clean pajamas.            11.  Place clean sheets on your bed the night of your first shower and do not  sleep with pets. Day of Surgery : Do not apply any lotions/deodorants the morning of surgery.  Please wear clean clothes to the hospital/surgery center.  FAILURE TO FOLLOW THESE INSTRUCTIONS MAY RESULT IN THE CANCELLATION OF YOUR SURGERY PATIENT SIGNATURE_________________________________  NURSE SIGNATURE__________________________________  ________________________________________________________________________

## 2018-07-04 NOTE — Progress Notes (Signed)
Anesthesia Chart Review   Case:  [id number] Date/Time:  07/07/18 0715   Procedure:  LAPAROSCOPIC CHOLECYSTECTOMY WITH INTRAOPERATIVE CHOLANGIOGRAM ERAS PATHWAY (N/A )   Anesthesia type:  General   Pre-op diagnosis:  gall bladder disease   Location:  [facility] ROOM 01 / [facility]   Surgeon:  Alphonsa Overall, MD      DISCUSSION: 73 yo never smoker with h/o PONV, HLD, CKD, s/p AVR (2009), A-fib (on Eliquis), CAD (CABG 1998, 2009), NSTEMI (9/14), stroke (residual right hearing loss), GERD, breast cancer (right breast s/p radiation 2018), HTN, CHF, bilateral carotid stenosis (bilateral ICA 40-59%), loop recorder implant (08/30/2017), DM II (V-go insulin pump in place), gall bladder disease scheduled for above surgery on 07/07/18 with Dr. Alphonsa Overall.    Cardiac clearance received on 07/01/18 from [facility], [place] which states, "Given past medical history and time since last visit, based on ACC/AHA guidelines, ADREANNE YONO would be at acceptable risk for the planned procedure without further cardiovascular testing.  OK to hold Eliquis 48 hours pre op".  Last loop recorder check on 06/23/18 with no events noted.   Pt with V-go 20 insulin pump in place.  She reports for her endoscopy she took it off at midnight and then put it back in place after surgery. I spoke with her endocrinologist, Dr. Elayne Snare, who advises pt to take off when she arrives at the hospital morning of to ensure she gets coverage overnight and then to replace after the surgery.  Discussed with pt who understands and agrees.   Creatinine at PST visit 1.61 which is stable.   Pt can proceed with planned procedure barring acute status change.  I evaluated pt during PST visit on 07/04/17.  She is asymptomatic, well appearing, no cough, no respiratory distress.   VS: BP 128/62   Pulse 73   Temp 37 C (Oral)   Resp 18   Ht '5\' 5"'$  (1.651 m)   SpO2 100%   BMI 29.62 kg/m   PROVIDERS: Mackie Pai, PA-C is PCP last seen 05/28/18  [name], PA-C is cardiologist   Elayne Snare, MD is Endocrinologist  LABS: Labs reviewed: Acceptable for surgery. (all labs ordered are listed, but only abnormal results are displayed)  Labs Reviewed  GLUCOSE, CAPILLARY - Abnormal; Notable for the following components:      Result Value   Glucose-Capillary 159 (*)    All other components within normal limits  CBC WITH DIFFERENTIAL/PLATELET  COMPREHENSIVE METABOLIC PANEL  HEMOGLOBIN A1C     IMAGES: Carotid Doppler 10/11/17 Final Interpretation: Right Carotid: Velocities in the right ICA are consistent with a 1-39% stenosis.                Non-hemodynamically significant plaque <50% noted in the CCA. The                ECA appears >50% stenosed. RICA high end of range and stable.  Left Carotid: Velocities in the left ICA are consistent with a 1-39% stenosis.               Non-hemodynamically significant plaque noted in the CCA. The ECA               appears >50% stenosed. LICA high end of range and stable.  CT Abdomen Pelvis 06/10/18 IMPRESSION: 1. No acute findings or explanation for the patient's symptoms. 2. Cholelithiasis with stable mild gallbladder distension, but no gallbladder wall thickening or surrounding inflammation. 3. Stable postsurgical fluid collection  in the right breast. No evidence of metastatic disease. 4. No evidence of recurrent urinary tract calculus or hydronephrosis. 5.  Extensive aortic Atherosclerosis (ICD10-I70.0). 6. Lower lumbar spondylosis.  CT Angio Chest 05/21/18 IMPRESSION: 1. No pulmonary emboli or other acute abnormalities. 2. Benign-appearing seroma in the right breast.  EKG: 06/12/18 Rate 68 bpm Atrial flutter  Nonspecific T wave abnormality Abnormal ECG  CV: Echo 02/07/18 Study Conclusions  - Left ventricle: The cavity size was normal. Systolic function was   normal. The estimated ejection fraction was in the range of 60%   to 65%. Wall motion was normal; there were no  regional wall   motion abnormalities. Features are consistent with a pseudonormal   left ventricular filling pattern, with concomitant abnormal   relaxation and increased filling pressure (grade 2 diastolic   dysfunction). Doppler parameters are consistent with high   ventricular filling pressure. - Aortic valve: A bioprosthesis was present and functioning   normally. Mean gradient (S): 16 mm Hg. - Mitral valve: Calcified annulus. There was mild regurgitation. - Left atrium: The atrium was moderately dilated. - Right ventricle: The cavity size was mildly dilated. Wall   thickness was normal. - Tricuspid valve: There was mild regurgitation. - Pulmonary arteries: Systolic pressure could not be accurately   estimated.  Stress Test 03/04/12 Normal stress nuclear study.  LV Ejection Fraction: 61%.  LV Wall Motion:  NL LV Function; NL Wall Motion  Kirk Ruths  Normal study Past Medical History:  Diagnosis Date  . Abnormally small mouth   . Acute on chronic combined systolic and diastolic congestive heart failure ([place]) 07/17/2013  . Adrenal insufficiency ([place]) 07/03/2016  . AKI (acute kidney injury) ([place]) 09/01/2017  . Allergic rhinitis 10/14/2009   Qualifier: Diagnosis of  By: Lamonte Sakai MD, Rose Fillers   Overview:  Overview:  Qualifier: Diagnosis of  By: Lamonte Sakai MD, Rose Fillers  Last Assessment & Plan:  Please continue Xyzal and Nasacort as you have been using them  . Anemia   . Asthma 05/12/2009   10/12/2014 p extensive coaching HFA effectiveness =    75% s spacer    Overview:  Overview:  10/12/2014 p extensive coaching HFA effectiveness =    75% s spacer   Last Assessment & Plan:  Please continue Symbicort 2 puffs twice a day. Remember to rinse and gargle after taking this medication. Take albuterol 2 puffs up to every 4 hours if needed for shortness of breath.  Follow with Dr Lamonte Sakai in 6 month  . Atrial tachycardia ([place]) 04/03/2013   Overview:  Last Assessment & Plan:  I have discussed the likely  benign nature of her problem. She has very minimal palpitations. I doubt atrial flutter. I would not anti-coagulate. Her monitor demonstrates that her episodes are short lived. Will follow.   . Bilateral carotid artery stenosis    Bilateral ICA 40-59%/  >50% LECA  . Breast cancer ([place]) 01/18/2016   right breast  . Breast cancer of upper-inner quadrant of right female breast ([place]) 12/16/2015  . CAD (coronary artery disease) cardiologist-  dr Aundra Dubin   Remote CABG in 1998 with redo in 2009  . Chemotherapy induced nausea and vomiting 02/24/2016  . 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 CHF (congestive heart failure) (HCC)    EF is low normal at 50 to 55% per echo in Jan. 2011  . Chronic coronary artery disease 01/24/2011   Overview:  Last Assessment & Plan:  Stable with  no ischemic symptoms.  Continue ASA, statin, ACEI, beta blocker.   . Chronic diastolic CHF (congestive heart failure) ([place]) 09/24/2013  . CKD (chronic kidney disease), stage IV ([place]) 09/24/2013  . Closed fracture of head of left humerus 09/09/2017  . Complication of anesthesia   . Essential hypertension 05/12/2009   Qualifier: Diagnosis of  By: Lamonte Sakai MD, Rose Fillers   . Gallstones   . GERD (gastroesophageal reflux disease)   . Glaucoma   . H/O atrial tachycardia 05/12/2009   Qualifier: History of  By: Lamonte Sakai MD, Rose Fillers   Overview:  Overview:  Qualifier: History of  By: Lamonte Sakai MD, Rose Fillers  Last Assessment & Plan:  There was no evidence of this on her cardiac monitor. I would recommend watchful waiting.   Marland Kitchen Hearing loss of right ear   . History of aortic valve replacement 10/27/2010   Overview:  Last Assessment & Plan:  Will get echo to reassess bioprosthetic aortic valve.   Marland Kitchen History of non-ST elevation myocardial infarction (NSTEMI)    Sept 2014--  thought to be type II HTN w/ LHC without infarct related artery and patent grafts  . History of radiation therapy  05/24/16-07/26/16   right breast 50.4 Gy in 28 fractions, right breast boost 10 Gy in 5 fractions  . Hyperlipidemia   . Hypertension    Resolved.  . Iron deficiency anemia   . Mania ([place]) 03/05/2016  . Moderate persistent asthma    pulmologist-  Dr. Malvin Johns  . Nonischemic cardiomyopathy ([place])   . Osteomyelitis of toe of left foot ([place]) 04/06/2016  . Osteopenia of multiple sites 10/19/2015  . PAF (paroxysmal atrial fibrillation) ([place])   . Personal history of chemotherapy 2017  . Personal history of radiation therapy 2017  . PONV (postoperative nausea and vomiting)   . Port catheter in place 02/17/2016  . Psoriasis    right leg  . Renal calculus, right   . Renal insufficiency, mild   . S/P AVR    prosthesis valve placement 2009 at same time re-do CABG  . Seizure ([place]) 06/29/2016  . Seizure-like activity ([place]) 09/01/2017  . Sensorineural hearing loss (SNHL) of both ears 01/06/2016  . Stroke [facility])    residual rt hearing loss  . Syncopal episodes 05/21/2018  . Syncope 09/06/2017  . Type 2 diabetes mellitus ([place])    monitored by dr Dwyane Dee    Past Surgical History:  Procedure Laterality Date  . AORTIC VALVE REPLACEMENT  2009   [facility] (done same time is CABG)  . BREAST LUMPECTOMY Right 01/18/2016  . BREAST LUMPECTOMY WITH RADIOACTIVE SEED AND SENTINEL LYMPH NODE BIOPSY Right 01/18/2016   Procedure: RIGHT BREAST LUMPECTOMY WITH RADIOACTIVE SEED AND SENTINEL LYMPH NODE BIOPSY;  Surgeon: DAlphonsa Overall MD;  [facility]  Service: General;  Laterality: Right;  . CARDIAC CATHETERIZATION  03/23/2008   Pre-redo CABG: L main OK, LAD (T), CFX (T), OM1 99%, RCA (T), LIMA-LAD OK, SVG-OM(?3) OK w/ little florw to OM2, SVG-RCA OK. EF NL  . CARPAL TUNNEL RELEASE    . COLONOSCOPY     around 2015. Possibly with [facility]   . CORONARY ARTERY BYPASS GRAFT  1998 &  re-do 2009   Had LIMA to DX/LAD, SVG to 2 marginal branches and [facility]; SVG to 3rd OM and PD at  time of redo  . CYSTOSCOPY W/ URETERAL STENT PLACEMENT Right 12/20/2014   Procedure: CYSTOSCOPY WITH RETROGRADE PYELOGRAM/URETERAL STENT PLACEMENT;  Surgeon:  Cleon Gustin, MD;  Location: [facility];  Service: Urology;  Laterality: Right;  . ESOPHAGOGASTRODUODENOSCOPY     many years ago per patient   . ESOPHAGOGASTRODUODENOSCOPY ENDOSCOPY  06/17/2018  . EYE SURGERY Bilateral    cataracts  . HOLMIUM LASER APPLICATION Right 11/06/4130   Procedure:  HOLMIUM LASER LITHOTRIPSY;  Surgeon: Cleon Gustin, MD;  Location: [facility];  Service: Urology;  Laterality: Right;  . LEFT HEART CATHETERIZATION WITH CORONARY/GRAFT ANGIOGRAM N/A 02/23/2013   Procedure: LEFT HEART CATHETERIZATION WITH Beatrix Fetters;  Surgeon: Blane Ohara, MD;  Location[facility] C[facility] LAB;  Service: Cardiovascular;  Laterality: N/A;  . LOOP RECORDER INSERTION N/A 08/30/2017   Procedure: LOOP RECORDER INSERTION;  Surgeon: Evans Lance, MD;  Location: [place] CV LAB;  Service: Cardiovascular;  Laterality: N/A;  . PORTACATH PLACEMENT Left 01/18/2016   Procedure: INSERTION PORT-A-CATH;  Surgeon: Alphonsa Overall, MD;  Location: [place];  Service: General;  Laterality: Left;  . portacath removal    . TONSILLECTOMY    . TRANSTHORACIC ECHOCARDIOGRAM  02-24-2013      mild LVH,  ef 50-55%/  AV bioprosthesis was present with very mild stenosis and no regurg., mean grandient 33mHg, peak grandient 278mg /  mild MR/  mild LAE and RAE/  moderate TR  . TUBAL LIGATION      MEDICATIONS: . ACCU-CHEK GUIDE test strip  . acetaminophen (TYLENOL) 325 MG tablet  . albuterol (PROVENTIL HFA;VENTOLIN HFA) 108 (90 Base) MCG/ACT inhaler  . amiodarone (PACERONE) 200 MG tablet  . budesonide-formoterol (SYMBICORT) 160-4.5 MCG/ACT inhaler  . ELIQUIS 5 MG TABS tablet  . exemestane (AROMASIN) 25 MG tablet  . ezetimibe (ZETIA) 10 MG tablet  . fludrocortisone (FLORINEF) 0.1 MG tablet  . furosemide (LASIX) 20  MG tablet  . Insulin Disposable Pump (V-GO 20) KIT  . insulin lispro (HUMALOG KWIKPEN) 100 UNIT/ML KiwkPen  . insulin lispro (HUMALOG) 100 UNIT/ML injection  . loratadine (CLARITIN) 10 MG tablet  . metoprolol tartrate (LOPRESSOR) 25 MG tablet  . pantoprazole (PROTONIX) 40 MG tablet  . rosuvastatin (CRESTOR) 40 MG tablet  . sucralfate (CARAFATE) 1 g tablet  . TRULICITY 1.5 MGGM/0.1UUOPN  . zolpidem (AMBIEN) 5 MG tablet   No current facility-administered medications for this encounter.      JeMaia PlanLU[facility] ([phone]/24/20 4:07 PM

## 2018-07-04 NOTE — Progress Notes (Signed)
Anesthesia PA , Konrad Felix, PA called and spoke to Dr. Dwyane Dee about patient having surgery on Monday and needing instructions for patient concerning her Insulin Pump V-GO 20. Per Janett Billow, [place] , she was given instructions from Dr. Dwyane Dee that patient would take her Insulin Pump off the morning of surgery and replace the insulin pump in Recovery. Patient was given these instructions by Konrad Felix, PA for Anesthesia with patient verbalizing understanding.

## 2018-07-04 NOTE — Telephone Encounter (Signed)
Pt last saw Dr Lovena Le on 06/12/18, last labs 06/13/18 Creat 1.69, age 73, weight 80.3kg, based on specified criteria pt is on appropriate dosage of Eliquis 5mg  BID.  Will refill rx.

## 2018-07-04 NOTE — Progress Notes (Signed)
Inpatient Diabetes Program Recommendations  AACE/ADA: New Consensus Statement on Inpatient Glycemic Control (2015)  Target Ranges:  Prepandial:   less than 140 mg/dL      Peak postprandial:   less than 180 mg/dL (1-2 hours)      Critically ill patients:  140 - 180 mg/dL   Lab Results  Component Value Date   GLUCAP 159 (H) 07/04/2018   HGBA1C 6.5 05/12/2018    Review of Glycemic Control  Spoke to [facility] this am regarding pt with V-GO 20. HgbA1C - 6.5% - good glycemic control.  Pt for surgery on Monday, January 27th and has V-GO 16. Per Dr. Dwyane Dee, pt will take insulin pump off prior to surgery and replace insulin pump in PACU.   Will follow when admitted.   Thank you. Lorenda Peck, RD, LDN, CDE Inpatient Diabetes Coordinator [phone]

## 2018-07-05 LAB — CUP PACEART REMOTE DEVICE CHECK
Date Time Interrogation Session: 20191211173800
Implantable Pulse Generator Implant Date: 20190322

## 2018-07-06 NOTE — Progress Notes (Signed)
Hannah [name] Documented: 06/19/2018 3:54 PM Location: [facility] Patient #: [phone] DOB: 1945-09-11 Married / Language: English / Race: White Female  History of Present Illness   The patient is a 73 year old female who presents with a complaint of Breast cancer.  Her PCP and referring physciain is Dr. Camelia Phenes [facility] [place])  She was seen at the [facility] - Drs. Lindi Adie and Kinard  She comes with her husband.  She comes early because of increasing epigastric pain. She thinks has been going on for 5 months. But she had 2 severe episodes in December, 2019, which took her to the emergency room.  She has had postprandial pain which occurs about one to 2 hours after eating. In part because of the pain, her weight is down almost 20 pounds from when we last saw her. She had an US of the abdomen on 31 May 2018 which showed cholelithiasis and sludge within the gallbladder. She had a CT scan of the abdomen on 10 June 2018 which showed cholelithiasis, post surgical fluid collection right breast, atherosclerosis, but no cause for her recent abdominal symptoms.  She underwent an upper endoscopy earlier this week by Dr. Lyndel Safe, which was negative. She has no known stomach, liver, pancreas, or colon disease.  I discussed with the patient the indications and risks of gall bladder surgery. The primary risks of gall bladder surgery include, but are not limited to, bleeding, infection, common bile duct injury, and open surgery. There is also the risk that the patient may have continued symptoms after surgery. We discussed the typical post-operative recovery course. I tried to answer the patient's questions. I gave the patient literature about gall bladder surgery.  Plan: 1) Cardiac clearance - note from Kerin Ransom dated 07/01/2018, 2) stop Eliquis about 4 days before surgery, 3) lap chole with IOC   Past Medical  History: 1. Right breast cancer She underwent two biopsies of the right biopsy at 12:30 o'clock on 12/14/2015 ([id number]) - which showed 2 areas of IDC, grade 2, ER - 0%, PR - 0%, Ki-67 50 and 70%, Her2Neu - neg. On mammogram, one area is 1.6 cm and one area is 0.8 cm - they are 1.8 cm apart. On 01/18/2016, she underwent a right breast lumpectomy, right axillary SLNBx, and left subclavian power port placement on 01/18/2016. Pathology [id number]) revealed 1.8 cm and 0.9 cm right breast cancer, 0/1 node. On anti-hormone tx - Letrozole Post op trouble with the right breast/axilla - has left a firm lateral right breast - but she has done well since I last saw her. 2. CAD - sees Dr. Einar Crow History of CABG - 1998, stent in 1999. ARV in 2009 Loop cardiac recorder - 2019 There has been some questions of A fib vs SVT She saw Dr. Beckie Salts on 06/12/2018 prior to the upper endoscopy. 3. Has bioprosthetic AVR 4. On Eliquis (she switched to this early 2019)  5. DM - sees Dr. Eduard Clos Most recent Hgb A1C - 6.5 - 05/12/2018 6. Sees Dr. Lamonte Sakai for asthma Averages prednisone 3 to 4 times per year 7. Back issues She had MRI of her back on 11/15/2015 which showed scoliosis and other changes She sees Dr. Vertell Limber and has had injections in the past 8. Mini stroke in 2014. 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 colonoscopy by Dr.  Buccini in 2012 10. History of kidneys stones Dr. Alyson Ingles 11. Left subclavian power port - 01/18/2016 - D. Hilary Milks Removed - 08/28/2016 - at [facility]  Social History: Married. Her husband, Hannah [name]. She works part time for her church. She is retired from [employer] in 2008. She has two sons.   Medication History (Armen Ferguson, CMA; 06/19/2018 3:55 PM) Amiodarone  HCl (200MG Tablet, Oral) Active. Dulaglutide (1.5MG/0.5ML Soln Pen-inj, Subcutaneous) Active. Insulin Lispro (100UNIT/ML Soln Cartridge, Subcutaneous) Active. Symbicort (Inhalation) Specific strength unknown - Active. V-Go 20 Specific strength unknown - Active. HumaLOG (100UNIT/ML Solution, Subcutaneous) Active. Zetia (10MG Tablet, Oral) Active. Crestor (10MG Tablet, Oral) Active. Plavix (75MG Tablet, Oral) Active. Trulicity (2.99BZ/1.6RC Soln Pen-inj, Subcutaneous) Active. Budesonide (Inhalation) (200MCG/INH Journalist, newspaper, Inhalation) Active. Metoprolol Succinate ER (100MG Tablet ER 24HR, Oral) Active. Ventolin HFA (108 (90 Base)MCG/ACT Aerosol Soln, Inhalation) Active. Furosemide (40MG Tablet, Oral) Active. Medications Reconciled  Vitals (Armen Ferguson CMA; 06/19/2018 3:55 PM) 06/19/2018 3:55 PM Weight: 178.13 lb Height: 64in Body Surface Area: 1.86 m Body Mass Index: 30.57 kg/m  Temp.: 98.74F  Pulse: 84 (Regular)  P.OX: 98% (Room air) BP: 160/80 (Sitting, Left Arm, Standard)   Physical Exam  General: Older obese WF alert and generally healthy appearing. She has lost about 20 pounds since I last saw her. HEENT: Normal. Pupils equal.  Neck: Supple. No mass. No thyroid mass.  Lymph Nodes: No supraclavicular, cervical or axillary nodes.  Lungs: Clear Heart: Median sternotomy scar. RRR.  Breasts: Right - firmness in the right lateral breast. There are skin changes from the radiation tx. She has limited motion of the right shoulder - just a little better the 90 degree abduction. No suspicious mass or nodule.   Left - No mass or nodule. Port incision looks good.   Abdomen: Sore in the RUQ. no mass or nodule. She has some abdominal scars from the prior chest surgery. She has a insulin pump in her right mid abdomen.  Extremities - No problems.  Assessment & Plan  1.  GALL BLADDER DISEASE (K82.9)  Plan:  1)    - note from  Kerin Ransom dated 07/01/2018  2) Stop Eliquis at least 4 days prior to surgery   3) Laparoscopic cholecystectomy with cholangiogram  2.  BREAST CANCER, STAGE 2, RIGHT (C50.911)  Story: Right biopsy at 12:30 o'clock on 12/14/2015 ([id number]) - which showed 2 areas of IDC, grade 2, ER - 0%, PR - 0%, Ki-67 50 and 70%, Her2Neu - neg.  On mammogram, one area is 1.6 cm and one area is 0.8 cm - they are 1.8 cm apart.  On 01/18/2016, she underwent a right breast lumpectomy, right axillary SLNBx on 01/18/2016. Pathology [id number]) revealed 1.8 cm and 0.9 cm right breast cancer, 0/1 node.  On Letrozole.  Oncology - Gudena and Kinard 3.  HEMATOMA OF AXILLA, SEQUELA (S40.029S)  Story: She got this axillary swelling/hematoma during radiation therapy and remote from her original surgery. 4.  ANTICOAGULATED (Z79.01)  Impression: On Eliquis since early 2019.  5. CAD - sees Dr. Einar Crow History of CABG - 1998, stent in 1999. ARV in 2009 Loop cardiac recorder - 2019 There has been some questions of A fib vs SVT She saw Dr. Beckie Salts on 06/12/2018 prior to the upper endoscopy. 6. Has bioprosthetic AVR  7. DM - sees Dr. Eduard Clos  Most recent Hgb A1C - 6.5 - 05/12/2018 8. Sees Dr. Lamonte Sakai for asthma  Averages prednisone 3 to 4 times per year 9. Back issues  She had MRI of her back on 11/15/2015 which showed scoliosis and other changes  She sees Dr. Vertell Limber and has had injections in the past 10. Mini stroke in 2014.  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 History of kidneys stones  Dr. Osborn Coho, MD, [facility] Surgery Pager: [phone] Office phone:  [phone]

## 2018-07-06 NOTE — H&P (View-Only) (Signed)
Hannah [name] Amir Documented: 06/19/2018 3:54 PM Location: [facility] Patient #: [phone] DOB: 1945-09-11 Married / Language: English / Race: White Female  History of Present Illness   The patient is a 73 year old female who presents with a complaint of Breast cancer.  Her PCP and referring physciain is Dr. Camelia Phenes [facility] [place])  She was seen at the [facility] - Drs. Lindi Adie and Kinard  She comes with her husband.  She comes early because of increasing epigastric pain. She thinks has been going on for 5 months. But she had 2 severe episodes in December, 2019, which took her to the emergency room.  She has had postprandial pain which occurs about one to 2 hours after eating. In part because of the pain, her weight is down almost 20 pounds from when we last saw her. She had an US of the abdomen on 31 May 2018 which showed cholelithiasis and sludge within the gallbladder. She had a CT scan of the abdomen on 10 June 2018 which showed cholelithiasis, post surgical fluid collection right breast, atherosclerosis, but no cause for her recent abdominal symptoms.  She underwent an upper endoscopy earlier this week by Dr. Lyndel Safe, which was negative. She has no known stomach, liver, pancreas, or colon disease.  I discussed with the patient the indications and risks of gall bladder surgery. The primary risks of gall bladder surgery include, but are not limited to, bleeding, infection, common bile duct injury, and open surgery. There is also the risk that the patient may have continued symptoms after surgery. We discussed the typical post-operative recovery course. I tried to answer the patient's questions. I gave the patient literature about gall bladder surgery.  Plan: 1) Cardiac clearance - note from Kerin Ransom dated 07/01/2018, 2) stop Eliquis about 4 days before surgery, 3) lap chole with IOC   Past Medical  History: 1. Right breast cancer She underwent two biopsies of the right biopsy at 12:30 o'clock on 12/14/2015 ([id number]) - which showed 2 areas of IDC, grade 2, ER - 0%, PR - 0%, Ki-67 50 and 70%, Her2Neu - neg. On mammogram, one area is 1.6 cm and one area is 0.8 cm - they are 1.8 cm apart. On 01/18/2016, she underwent a right breast lumpectomy, right axillary SLNBx, and left subclavian power port placement on 01/18/2016. Pathology [id number]) revealed 1.8 cm and 0.9 cm right breast cancer, 0/1 node. On anti-hormone tx - Letrozole Post op trouble with the right breast/axilla - has left a firm lateral right breast - but she has done well since I last saw her. 2. CAD - sees Dr. Einar Crow History of CABG - 1998, stent in 1999. ARV in 2009 Loop cardiac recorder - 2019 There has been some questions of A fib vs SVT She saw Dr. Beckie Salts on 06/12/2018 prior to the upper endoscopy. 3. Has bioprosthetic AVR 4. On Eliquis (she switched to this early 2019)  5. DM - sees Dr. Eduard Clos Most recent Hgb A1C - 6.5 - 05/12/2018 6. Sees Dr. Lamonte Sakai for asthma Averages prednisone 3 to 4 times per year 7. Back issues She had MRI of her back on 11/15/2015 which showed scoliosis and other changes She sees Dr. Vertell Limber and has had injections in the past 8. Mini stroke in 2014. 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 colonoscopy by Dr.  Buccini in 2012 10. History of kidneys stones Dr. Alyson Ingles 11. Left subclavian power port - 01/18/2016 - D. Zafirah Vanzee Removed - 08/28/2016 - at [facility]  Social History: Married. Her husband, [name]. She works part time for her church. She is retired from [employer] in 2008. She has two sons.   Medication History (Armen Ferguson, CMA; 06/19/2018 3:55 PM) Amiodarone  HCl (200MG Tablet, Oral) Active. Dulaglutide (1.5MG/0.5ML Soln Pen-inj, Subcutaneous) Active. Insulin Lispro (100UNIT/ML Soln Cartridge, Subcutaneous) Active. Symbicort (Inhalation) Specific strength unknown - Active. V-Go 20 Specific strength unknown - Active. HumaLOG (100UNIT/ML Solution, Subcutaneous) Active. Zetia (10MG Tablet, Oral) Active. Crestor (10MG Tablet, Oral) Active. Plavix (75MG Tablet, Oral) Active. Trulicity (2.99BZ/1.6RC Soln Pen-inj, Subcutaneous) Active. Budesonide (Inhalation) (200MCG/INH Journalist, newspaper, Inhalation) Active. Metoprolol Succinate ER (100MG Tablet ER 24HR, Oral) Active. Ventolin HFA (108 (90 Base)MCG/ACT Aerosol Soln, Inhalation) Active. Furosemide (40MG Tablet, Oral) Active. Medications Reconciled  Vitals (Armen Ferguson CMA; 06/19/2018 3:55 PM) 06/19/2018 3:55 PM Weight: 178.13 lb Height: 64in Body Surface Area: 1.86 m Body Mass Index: 30.57 kg/m  Temp.: 98.74F  Pulse: 84 (Regular)  P.OX: 98% (Room air) BP: 160/80 (Sitting, Left Arm, Standard)   Physical Exam  General: Older obese WF alert and generally healthy appearing. She has lost about 20 pounds since I last saw her. HEENT: Normal. Pupils equal.  Neck: Supple. No mass. No thyroid mass.  Lymph Nodes: No supraclavicular, cervical or axillary nodes.  Lungs: Clear Heart: Median sternotomy scar. RRR.  Breasts: Right - firmness in the right lateral breast. There are skin changes from the radiation tx. She has limited motion of the right shoulder - just a little better the 90 degree abduction. No suspicious mass or nodule.   Left - No mass or nodule. Port incision looks good.   Abdomen: Sore in the RUQ. no mass or nodule. She has some abdominal scars from the prior chest surgery. She has a insulin pump in her right mid abdomen.  Extremities - No problems.  Assessment & Plan  1.  GALL BLADDER DISEASE (K82.9)  Plan:  1)    - note from  Kerin Ransom dated 07/01/2018  2) Stop Eliquis at least 4 days prior to surgery   3) Laparoscopic cholecystectomy with cholangiogram  2.  BREAST CANCER, STAGE 2, RIGHT (C50.911)  Story: Right biopsy at 12:30 o'clock on 12/14/2015 ([id number]) - which showed 2 areas of IDC, grade 2, ER - 0%, PR - 0%, Ki-67 50 and 70%, Her2Neu - neg.  On mammogram, one area is 1.6 cm and one area is 0.8 cm - they are 1.8 cm apart.  On 01/18/2016, she underwent a right breast lumpectomy, right axillary SLNBx on 01/18/2016. Pathology [id number]) revealed 1.8 cm and 0.9 cm right breast cancer, 0/1 node.  On Letrozole.  Oncology - Gudena and Kinard 3.  HEMATOMA OF AXILLA, SEQUELA (S40.029S)  Story: She got this axillary swelling/hematoma during radiation therapy and remote from her original surgery. 4.  ANTICOAGULATED (Z79.01)  Impression: On Eliquis since early 2019.  5. CAD - sees Dr. Einar Crow History of CABG - 1998, stent in 1999. ARV in 2009 Loop cardiac recorder - 2019 There has been some questions of A fib vs SVT She saw Dr. Beckie Salts on 06/12/2018 prior to the upper endoscopy. 6. Has bioprosthetic AVR  7. DM - sees Dr. Eduard Clos  Most recent Hgb A1C - 6.5 - 05/12/2018 8. Sees Dr. Lamonte Sakai for asthma  Averages prednisone 3 to 4 times per year 9. Back issues  She had MRI of her back on 11/15/2015 which showed scoliosis and other changes  She sees Dr. Vertell Limber and has had injections in the past 10. Mini stroke in 2014.  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 History of kidneys stones  Dr. Osborn Coho, MD, [facility] Surgery Pager: [phone] Office phone:  [phone]

## 2018-07-07 ENCOUNTER — Encounter (HOSPITAL_COMMUNITY): Payer: Self-pay | Admitting: *Deleted

## 2018-07-07 ENCOUNTER — Other Ambulatory Visit: Payer: Self-pay

## 2018-07-07 ENCOUNTER — Observation Stay (HOSPITAL_COMMUNITY)
Admission: RE | Admit: 2018-07-07 | Discharge: 2018-07-08 | Disposition: A | Discharge status: Home / Self Care | Payer: Medicare Other | Source: Other Acute Inpatient Hospital | Attending: Surgery | Admitting: Surgery

## 2018-07-07 ENCOUNTER — Telehealth: Payer: Self-pay | Admitting: Internal Medicine

## 2018-07-07 ENCOUNTER — Ambulatory Visit (HOSPITAL_COMMUNITY): Payer: Medicare Other | Admitting: Anesthesiology

## 2018-07-07 ENCOUNTER — Encounter (HOSPITAL_COMMUNITY)
Admission: RE | Disposition: A | Discharge status: Home / Self Care | Payer: Self-pay | Source: Other Acute Inpatient Hospital | Attending: Surgery

## 2018-07-07 ENCOUNTER — Ambulatory Visit (HOSPITAL_COMMUNITY): Payer: Medicare Other | Admitting: Physician Assistant

## 2018-07-07 ENCOUNTER — Ambulatory Visit (HOSPITAL_COMMUNITY): Payer: Medicare Other

## 2018-07-07 ENCOUNTER — Telehealth: Payer: Self-pay

## 2018-07-07 DIAGNOSIS — Z880 Allergy status to penicillin: Secondary | ICD-10-CM | POA: Insufficient documentation

## 2018-07-07 DIAGNOSIS — C50911 Malignant neoplasm of unspecified site of right female breast: Secondary | ICD-10-CM | POA: Diagnosis not present

## 2018-07-07 DIAGNOSIS — I2510 Atherosclerotic heart disease of native coronary artery without angina pectoris: Secondary | ICD-10-CM | POA: Insufficient documentation

## 2018-07-07 DIAGNOSIS — Z951 Presence of aortocoronary bypass graft: Secondary | ICD-10-CM | POA: Insufficient documentation

## 2018-07-07 DIAGNOSIS — Z91040 Latex allergy status: Secondary | ICD-10-CM | POA: Insufficient documentation

## 2018-07-07 DIAGNOSIS — K8010 Calculus of gallbladder with chronic cholecystitis without obstruction: Secondary | ICD-10-CM | POA: Diagnosis not present

## 2018-07-07 DIAGNOSIS — Z888 Allergy status to other drugs, medicaments and biological substances status: Secondary | ICD-10-CM | POA: Insufficient documentation

## 2018-07-07 DIAGNOSIS — Z79899 Other long term (current) drug therapy: Secondary | ICD-10-CM | POA: Insufficient documentation

## 2018-07-07 DIAGNOSIS — K828 Other specified diseases of gallbladder: Secondary | ICD-10-CM | POA: Insufficient documentation

## 2018-07-07 DIAGNOSIS — Z794 Long term (current) use of insulin: Secondary | ICD-10-CM | POA: Insufficient documentation

## 2018-07-07 DIAGNOSIS — Z8673 Personal history of transient ischemic attack (TIA), and cerebral infarction without residual deficits: Secondary | ICD-10-CM | POA: Insufficient documentation

## 2018-07-07 DIAGNOSIS — Z953 Presence of xenogenic heart valve: Secondary | ICD-10-CM | POA: Insufficient documentation

## 2018-07-07 DIAGNOSIS — Z923 Personal history of irradiation: Secondary | ICD-10-CM | POA: Insufficient documentation

## 2018-07-07 DIAGNOSIS — Z7902 Long term (current) use of antithrombotics/antiplatelets: Secondary | ICD-10-CM | POA: Insufficient documentation

## 2018-07-07 DIAGNOSIS — Z87442 Personal history of urinary calculi: Secondary | ICD-10-CM | POA: Insufficient documentation

## 2018-07-07 DIAGNOSIS — E119 Type 2 diabetes mellitus without complications: Secondary | ICD-10-CM | POA: Insufficient documentation

## 2018-07-07 DIAGNOSIS — Z881 Allergy status to other antibiotic agents status: Secondary | ICD-10-CM | POA: Insufficient documentation

## 2018-07-07 DIAGNOSIS — Z9641 Presence of insulin pump (external) (internal): Secondary | ICD-10-CM | POA: Insufficient documentation

## 2018-07-07 DIAGNOSIS — Z7901 Long term (current) use of anticoagulants: Secondary | ICD-10-CM | POA: Insufficient documentation

## 2018-07-07 DIAGNOSIS — K8020 Calculus of gallbladder without cholecystitis without obstruction: Secondary | ICD-10-CM

## 2018-07-07 DIAGNOSIS — J45909 Unspecified asthma, uncomplicated: Secondary | ICD-10-CM | POA: Insufficient documentation

## 2018-07-07 HISTORY — PX: CHOLECYSTECTOMY: SHX55

## 2018-07-07 LAB — GLUCOSE, CAPILLARY
Glucose-Capillary: 145 mg/dL — ABNORMAL HIGH (ref 70–99)
Glucose-Capillary: 157 mg/dL — ABNORMAL HIGH (ref 70–99)
Glucose-Capillary: 169 mg/dL — ABNORMAL HIGH (ref 70–99)
Glucose-Capillary: 185 mg/dL — ABNORMAL HIGH (ref 70–99)
Glucose-Capillary: 167 mg/dL — ABNORMAL HIGH (ref 70–99)

## 2018-07-07 SURGERY — LAPAROSCOPIC CHOLECYSTECTOMY WITH INTRAOPERATIVE CHOLANGIOGRAM
Anesthesia: General | Site: Abdomen

## 2018-07-07 MED ORDER — DEXAMETHASONE SODIUM PHOSPHATE 10 MG/ML IJ SOLN
INTRAMUSCULAR | Status: AC
  Filled 2018-07-07: qty 1

## 2018-07-07 MED ORDER — PROPOFOL 10 MG/ML IV BOLUS
INTRAVENOUS | Status: DC | PRN
  Administered 2018-07-07: 110 mg via INTRAVENOUS

## 2018-07-07 MED ORDER — INSULIN ASPART 100 UNIT/ML ~~LOC~~ SOLN
0.0000 [IU] | Freq: Three times a day (TID) | SUBCUTANEOUS | Status: DC
  Administered 2018-07-07 – 2018-07-08 (×3): 3 [IU] via SUBCUTANEOUS

## 2018-07-07 MED ORDER — CHLORHEXIDINE GLUCONATE CLOTH 2 % EX PADS: 6.0000 | MEDICATED_PAD | Freq: Once | CUTANEOUS | Status: DC

## 2018-07-07 MED ORDER — HYDROCODONE-ACETAMINOPHEN 5-325 MG PO TABS
1.0000 | ORAL_TABLET | ORAL | Status: DC | PRN
  Administered 2018-07-07 – 2018-07-08 (×3): 1 via ORAL
  Filled 2018-07-07 (×3): qty 1

## 2018-07-07 MED ORDER — AMIODARONE HCL 200 MG PO TABS
200.0000 mg | ORAL_TABLET | Freq: Every day | ORAL | Status: DC
  Filled 2018-07-07: qty 1

## 2018-07-07 MED ORDER — FENTANYL CITRATE (PF) 100 MCG/2ML IJ SOLN
INTRAMUSCULAR | Status: DC | PRN
  Administered 2018-07-07 (×2): 50 ug via INTRAVENOUS

## 2018-07-07 MED ORDER — LACTATED RINGERS IV SOLN
INTRAVENOUS | Status: AC | PRN
  Administered 2018-07-07: 1000 mL

## 2018-07-07 MED ORDER — LIDOCAINE 20MG/ML (2%) 15 ML SYRINGE OPTIME
INTRAMUSCULAR | Status: DC | PRN
  Administered 2018-07-07: 1.5 mg/kg/h via INTRAVENOUS

## 2018-07-07 MED ORDER — IOPAMIDOL (ISOVUE-300) INJECTION 61%
INTRAVENOUS | Status: AC
  Filled 2018-07-07: qty 50

## 2018-07-07 MED ORDER — MIDAZOLAM HCL 5 MG/5ML IJ SOLN
INTRAMUSCULAR | Status: DC | PRN
  Administered 2018-07-07 (×2): 1 mg via INTRAVENOUS

## 2018-07-07 MED ORDER — MOMETASONE FURO-FORMOTEROL FUM 200-5 MCG/ACT IN AERO
2.0000 | INHALATION_SPRAY | Freq: Two times a day (BID) | RESPIRATORY_TRACT | Status: DC
  Administered 2018-07-07: 2 via RESPIRATORY_TRACT
  Filled 2018-07-07: qty 8.8

## 2018-07-07 MED ORDER — FENTANYL CITRATE (PF) 250 MCG/5ML IJ SOLN
INTRAMUSCULAR | Status: AC
  Filled 2018-07-07: qty 5

## 2018-07-07 MED ORDER — MORPHINE SULFATE (PF) 4 MG/ML IV SOLN: 1.0000 mg | INTRAVENOUS | Status: DC | PRN

## 2018-07-07 MED ORDER — ONDANSETRON HCL 4 MG/2ML IJ SOLN
INTRAMUSCULAR | Status: AC
  Filled 2018-07-07: qty 2

## 2018-07-07 MED ORDER — ALBUTEROL SULFATE (2.5 MG/3ML) 0.083% IN NEBU: 2.5000 mg | INHALATION_SOLUTION | Freq: Four times a day (QID) | RESPIRATORY_TRACT | Status: DC | PRN

## 2018-07-07 MED ORDER — HYDROMORPHONE HCL 1 MG/ML IJ SOLN: 0.2500 mg | INTRAMUSCULAR | Status: DC | PRN

## 2018-07-07 MED ORDER — FUROSEMIDE 20 MG PO TABS: 20.0000 mg | ORAL_TABLET | Freq: Every day | ORAL | Status: DC | PRN

## 2018-07-07 MED ORDER — 0.9 % SODIUM CHLORIDE (POUR BTL) OPTIME
TOPICAL | Status: DC | PRN
  Administered 2018-07-07: 1000 mL

## 2018-07-07 MED ORDER — PHENYLEPHRINE 40 MCG/ML (10ML) SYRINGE FOR IV PUSH (FOR BLOOD PRESSURE SUPPORT)
PREFILLED_SYRINGE | INTRAVENOUS | Status: AC
  Filled 2018-07-07: qty 10

## 2018-07-07 MED ORDER — SUGAMMADEX SODIUM 200 MG/2ML IV SOLN
INTRAVENOUS | Status: DC | PRN
  Administered 2018-07-07: 300 mg via INTRAVENOUS

## 2018-07-07 MED ORDER — EPHEDRINE SULFATE 50 MG/ML IJ SOLN
INTRAMUSCULAR | Status: DC | PRN
  Administered 2018-07-07: 5 mg via INTRAVENOUS
  Administered 2018-07-07: 10 mg via INTRAVENOUS
  Administered 2018-07-07 (×3): 5 mg via INTRAVENOUS

## 2018-07-07 MED ORDER — PROPOFOL 10 MG/ML IV BOLUS
INTRAVENOUS | Status: AC
  Filled 2018-07-07: qty 20

## 2018-07-07 MED ORDER — LIDOCAINE HCL (CARDIAC) PF 100 MG/5ML IV SOSY
PREFILLED_SYRINGE | INTRAVENOUS | Status: DC | PRN
  Administered 2018-07-07: 30 mg via INTRAVENOUS

## 2018-07-07 MED ORDER — POTASSIUM CHLORIDE IN NACL 20-0.45 MEQ/L-% IV SOLN
INTRAVENOUS | Status: DC
  Administered 2018-07-07: 12:00:00 via INTRAVENOUS
  Filled 2018-07-07: qty 1000

## 2018-07-07 MED ORDER — ZOLPIDEM TARTRATE 5 MG PO TABS: 5.0000 mg | ORAL_TABLET | Freq: Every evening | ORAL | Status: DC | PRN

## 2018-07-07 MED ORDER — ROCURONIUM BROMIDE 100 MG/10ML IV SOLN
INTRAVENOUS | Status: AC
  Filled 2018-07-07: qty 1

## 2018-07-07 MED ORDER — LIDOCAINE HCL 2 % IJ SOLN
INTRAMUSCULAR | Status: AC
  Filled 2018-07-07: qty 20

## 2018-07-07 MED ORDER — ONDANSETRON HCL 4 MG/2ML IJ SOLN
INTRAMUSCULAR | Status: DC | PRN
  Administered 2018-07-07: 4 mg via INTRAVENOUS

## 2018-07-07 MED ORDER — LIDOCAINE 2% (20 MG/ML) 5 ML SYRINGE
INTRAMUSCULAR | Status: AC
  Filled 2018-07-07: qty 5

## 2018-07-07 MED ORDER — BUPIVACAINE-EPINEPHRINE (PF) 0.25% -1:200000 IJ SOLN
INTRAMUSCULAR | Status: AC
  Filled 2018-07-07: qty 30

## 2018-07-07 MED ORDER — IBUPROFEN 200 MG PO TABS: 600.0000 mg | ORAL_TABLET | Freq: Four times a day (QID) | ORAL | Status: DC | PRN

## 2018-07-07 MED ORDER — METOPROLOL TARTRATE 25 MG PO TABS: 25.0000 mg | ORAL_TABLET | Freq: Four times a day (QID) | ORAL | Status: DC | PRN

## 2018-07-07 MED ORDER — ROCURONIUM BROMIDE 100 MG/10ML IV SOLN
INTRAVENOUS | Status: DC | PRN
  Administered 2018-07-07: 40 mg via INTRAVENOUS

## 2018-07-07 MED ORDER — SODIUM CHLORIDE 0.9 % IV SOLN
INTRAVENOUS | Status: DC | PRN
  Administered 2018-07-07: 15 mL

## 2018-07-07 MED ORDER — MEPERIDINE HCL 50 MG/ML IJ SOLN: 6.2500 mg | INTRAMUSCULAR | Status: DC | PRN

## 2018-07-07 MED ORDER — ACETAMINOPHEN 500 MG PO TABS
1000.0000 mg | ORAL_TABLET | ORAL | Status: AC
  Administered 2018-07-07: 1000 mg via ORAL
  Filled 2018-07-07: qty 2

## 2018-07-07 MED ORDER — SUGAMMADEX SODIUM 200 MG/2ML IV SOLN
INTRAVENOUS | Status: AC
  Filled 2018-07-07: qty 2

## 2018-07-07 MED ORDER — LACTATED RINGERS IV SOLN
INTRAVENOUS | Status: DC
  Administered 2018-07-07 (×2): via INTRAVENOUS

## 2018-07-07 MED ORDER — ONDANSETRON HCL 4 MG/2ML IJ SOLN: 4.0000 mg | Freq: Four times a day (QID) | INTRAMUSCULAR | Status: DC | PRN

## 2018-07-07 MED ORDER — EPHEDRINE 5 MG/ML INJ
INTRAVENOUS | Status: AC
  Filled 2018-07-07: qty 10

## 2018-07-07 MED ORDER — GABAPENTIN 300 MG PO CAPS
300.0000 mg | ORAL_CAPSULE | ORAL | Status: AC
  Administered 2018-07-07: 300 mg via ORAL
  Filled 2018-07-07: qty 1

## 2018-07-07 MED ORDER — BUPIVACAINE-EPINEPHRINE 0.25% -1:200000 IJ SOLN
INTRAMUSCULAR | Status: DC | PRN
  Administered 2018-07-07: 30 mL

## 2018-07-07 MED ORDER — CEFAZOLIN SODIUM-DEXTROSE 2-4 GM/100ML-% IV SOLN
2.0000 g | INTRAVENOUS | Status: AC
  Administered 2018-07-07: 2 g via INTRAVENOUS
  Filled 2018-07-07: qty 100

## 2018-07-07 MED ORDER — MIDAZOLAM HCL 2 MG/2ML IJ SOLN
INTRAMUSCULAR | Status: AC
  Filled 2018-07-07: qty 2

## 2018-07-07 MED ORDER — PHENYLEPHRINE HCL 10 MG/ML IJ SOLN
INTRAMUSCULAR | Status: DC | PRN
  Administered 2018-07-07: 40 ug via INTRAVENOUS
  Administered 2018-07-07: 80 ug via INTRAVENOUS
  Administered 2018-07-07: 40 ug via INTRAVENOUS
  Administered 2018-07-07: 80 ug via INTRAVENOUS
  Administered 2018-07-07: 40 ug via INTRAVENOUS

## 2018-07-07 MED ORDER — ONDANSETRON 4 MG PO TBDP: 4.0000 mg | ORAL_TABLET | Freq: Four times a day (QID) | ORAL | Status: DC | PRN

## 2018-07-07 MED ORDER — GLYCOPYRROLATE 0.2 MG/ML IJ SOLN
INTRAMUSCULAR | Status: DC | PRN
  Administered 2018-07-07: 0.1 mg via INTRAVENOUS

## 2018-07-07 MED ORDER — TRAMADOL HCL 50 MG PO TABS: 50.0000 mg | ORAL_TABLET | Freq: Four times a day (QID) | ORAL | Status: DC | PRN

## 2018-07-07 MED ORDER — ENOXAPARIN SODIUM 40 MG/0.4ML ~~LOC~~ SOLN
40.0000 mg | SUBCUTANEOUS | Status: DC
  Filled 2018-07-07: qty 0.4

## 2018-07-07 SURGICAL SUPPLY — 37 items
APPLIER CLIP 5 13 M/L LIGAMAX5 (MISCELLANEOUS)
APPLIER CLIP ROT 10 11.4 M/L (STAPLE)
CABLE HIGH FREQUENCY MONO STRZ (ELECTRODE) ×2 IMPLANT
CHLORAPREP W/TINT 26ML (MISCELLANEOUS) ×2 IMPLANT
CHOLANGIOGRAM CATH TAUT (CATHETERS) ×2 IMPLANT
CLIP APPLIE 5 13 M/L LIGAMAX5 (MISCELLANEOUS) IMPLANT
CLIP APPLIE ROT 10 11.4 M/L (STAPLE) IMPLANT
COVER MAYO STAND STRL (DRAPES) ×2 IMPLANT
COVER SURGICAL LIGHT HANDLE (MISCELLANEOUS) ×2 IMPLANT
COVER WAND RF STERILE (DRAPES) IMPLANT
DECANTER SPIKE VIAL GLASS SM (MISCELLANEOUS) ×2 IMPLANT
DERMABOND ADVANCED (GAUZE/BANDAGES/DRESSINGS) ×1
DERMABOND ADVANCED .7 DNX12 (GAUZE/BANDAGES/DRESSINGS) ×1 IMPLANT
DRAPE C-ARM 42X120 X-RAY (DRAPES) ×2 IMPLANT
ELECT REM PT RETURN 15FT ADLT (MISCELLANEOUS) ×2 IMPLANT
GLOVE SURG SIGNA 7.5 PF LTX (GLOVE) ×2 IMPLANT
GOWN STRL REUS W/TWL XL LVL3 (GOWN DISPOSABLE) ×6 IMPLANT
HEMOSTAT SURGICEL 4X8 (HEMOSTASIS) IMPLANT
IV CATH 14GX2 1/4 (CATHETERS) ×2 IMPLANT
IV SET EXTENSION CATH 6 NF (IV SETS) ×2 IMPLANT
KIT BASIN OR (CUSTOM PROCEDURE TRAY) ×2 IMPLANT
POUCH RETRIEVAL ECOSAC 10 (ENDOMECHANICALS) ×1 IMPLANT
POUCH RETRIEVAL ECOSAC 10MM (ENDOMECHANICALS) ×1
SCISSORS LAP 5X35 DISP (ENDOMECHANICALS) ×2 IMPLANT
SET IRRIG TUBING LAPAROSCOPIC (IRRIGATION / IRRIGATOR) ×2 IMPLANT
SET TUBE SMOKE EVAC HIGH FLOW (TUBING) ×2 IMPLANT
SLEEVE ADV FIXATION 5X100MM (TROCAR) ×2 IMPLANT
STOPCOCK 4 WAY LG BORE MALE ST (IV SETS) ×2 IMPLANT
STRIP CLOSURE SKIN 1/4X4 (GAUZE/BANDAGES/DRESSINGS) IMPLANT
SUT MNCRL AB 4-0 PS2 18 (SUTURE) ×2 IMPLANT
SYR 10ML ECCENTRIC (SYRINGE) ×2 IMPLANT
TOWEL OR 17X26 10 PK STRL BLUE (TOWEL DISPOSABLE) ×2 IMPLANT
TOWEL OR NON WOVEN STRL DISP B (DISPOSABLE) ×2 IMPLANT
TRAY LAPAROSCOPIC (CUSTOM PROCEDURE TRAY) ×2 IMPLANT
TROCAR ADV FIXATION 11X100MM (TROCAR) IMPLANT
TROCAR ADV FIXATION 5X100MM (TROCAR) ×2 IMPLANT
TROCAR XCEL BLUNT TIP 100MML (ENDOMECHANICALS) ×2 IMPLANT

## 2018-07-07 NOTE — Op Note (Signed)
07/07/2018  8:59 AM  PATIENT:  Hannah [name], 73 y.o., female, MRN: [id number]  PREOP DIAGNOSIS:  gall bladder disease  POSTOP DIAGNOSIS:   Chronic cholecystitis, cholelithiasis  PROCEDURE:   Procedure(s):  LAPAROSCOPIC CHOLECYSTECTOMY WITH INTRAOPERATIVE CHOLANGIOGRAM ERAS PATHWAY  SURGEON:   Alphonsa Overall, M.D.  ASSISTANT:   B. Hoxworth, M.D.  ANESTHESIA:   general  Anesthesiologist: Lillia Abed, MD CRNA: Garrel Ridgel, CRNA  General  ASA: 3  EBL:  minimal  ml  BLOOD ADMINISTERED: none  DRAINS: none   LOCAL MEDICATIONS USED:   30 cc of 1/4% marcaine  SPECIMEN:   Gall bladder  COUNTS CORRECT:  YES  INDICATIONS FOR PROCEDURE:  Hannah [name] is a 73 y.o. (DOB: 1945/08/21) white female whose primary care physician is Saguier, Percell Miller, Hershal Coria and comes for cholecystectomy.   The indications and risks of the gall bladder surgery were explained to the patient.  The risks include, but are not limited to, infection, bleeding, common bile duct injury and open surgery.  SURGERY:  The patient was taken to OR room #1 at [facility].  The abdomen was prepped with chloroprep.  The patient was given 2 gm Ancef at the beginning of the operation.   A time out was held and the surgical checklist run.   An infraumbilical incision was made into the abdominal cavity.  A 12 mm Hasson trocar was inserted into the abdominal cavity through the infraumbilical incision and secured with a 0 Vicryl suture.  Three additional trocars were inserted: a 5 mm trocar in the sub-xiphoid location, a 5 mm trocar in the right mid subcostal area, and a 5 mm trocar in the right lateral subcostal area.   The abdomen was explored and the liver, stomach, and bowel that could be seen were unremarkable.   The gall bladder had omentum attached to most of its surface.  It was also attached to the duodenum.  I dissected off the adhesions and the duodenum from the gall bladder.   I grasped the gall  bladder and rotated it cephalad.  Disssection was carried down to the gall bladder/cystic duct junction and the cystic duct isolated.  A clip was placed on the gall bladder side of the cystic duct.   An intra-operative cholangiogram was shot.   The intra-operative cholangiogram was shot using a cut off Taut catheter placed through a 14 gauge angiocath in the RUQ.  The Taut catheter was inserted in the cut cystic duct and secured with an endoclip.  A cholangiogram was shot with 15 cc of 1/2 strength Isoview.  Using fluoroscopy, the cholangiogram showed the flow of contrast into the common bile duct, up the hepatic radicals, and into the duodenum. There was some filling of the pancreatitic duct.  There was no mass or obstruction.  This was a normal intra-operative cholangiogram.   The Taut catheter was removed.  The cystic duct was tripley endoclipped and the cystic artery was identified and clipped.  The gall bladder was bluntly and sharpley dissected from the gall bladder bed.   After the gall bladder was removed from the liver, the gall bladder bed and Triangle of Calot were inspected.  There was no bleeding or bile leak.  The gall bladder was placed in a Ecco Sac bag and delivered through the umbilicus.  The abdomen was irrigated with 1,000 cc saline.   The trocars were then removed.  I infiltrated 30 cc of 1/4% Marcaine into the incisions.  The umbilical port  closed with a 0 Vicryl suture and the skin closed with 4-0 Monocryl.  The skin was painted with DermaBond.  The patient's sponge and needle count were correct.  The patient was transported to the RR in good condition.  Alphonsa Overall, MD, [facility] Surgery Pager: [phone] Office phone:  [phone]

## 2018-07-07 NOTE — Telephone Encounter (Signed)
New Message   Pt c/o medication issue:  1. Name of Medication: Metoprolol  2. How are you currently taking this medication (dosage and times per day)? N/A  3. Are you having a reaction (difficulty breathing--STAT)? No  4. What is your medication issue? Patient calling to verify the dosage.

## 2018-07-07 NOTE — Anesthesia Postprocedure Evaluation (Signed)
Anesthesia Post Note  Patient: BONNI NEUSER  Procedure(s) Performed: LAPAROSCOPIC CHOLECYSTECTOMY WITH INTRAOPERATIVE CHOLANGIOGRAM ERAS PATHWAY (N/A Abdomen)     Patient location during evaluation: PACU Anesthesia Type: General Level of consciousness: awake and alert Pain management: pain level controlled Vital Signs Assessment: post-procedure vital signs reviewed and stable Respiratory status: spontaneous breathing, nonlabored ventilation, respiratory function stable and patient connected to nasal cannula oxygen Cardiovascular status: blood pressure returned to baseline and stable Postop Assessment: no apparent nausea or vomiting Anesthetic complications: no    Last Vitals:  Vitals:   07/07/18 1218 07/07/18 1312  BP: (!) 133/53 (!) 122/49  Pulse: 65 64  Resp: 15 16  Temp: (!) 36.3 C (!) 36.3 C  SpO2: 100% 95%    Last Pain:  Vitals:   07/07/18 1312  TempSrc: Oral  PainSc:                  Steffen Hase DAVID

## 2018-07-07 NOTE — Telephone Encounter (Signed)
Pt currently admitted.  Reply sent via [place].

## 2018-07-07 NOTE — Telephone Encounter (Signed)
Returned call to pharmacy at hospital.  Clarified metoprolol order.

## 2018-07-07 NOTE — Transfer of Care (Signed)
Immediate Anesthesia Transfer of Care Note  Patient: Hannah [name]  Procedure(s) Performed: LAPAROSCOPIC CHOLECYSTECTOMY WITH INTRAOPERATIVE CHOLANGIOGRAM ERAS PATHWAY (N/A Abdomen)  Patient Location: PACU  Anesthesia Type:General  Level of Consciousness: awake, oriented and drowsy  Airway & Oxygen Therapy: Patient Spontanous Breathing and Patient connected to face mask oxygen  Post-op Assessment: Report given to RN and Post -op Vital signs reviewed and stable  Post vital signs: Reviewed and stable  Last Vitals:  Vitals Value Taken Time  BP 97/37 07/07/2018  9:02 AM  Temp    Pulse 58 07/07/2018  9:03 AM  Resp 19 07/07/2018  9:03 AM  SpO2 100 % 07/07/2018  9:03 AM  Vitals shown include unvalidated device data.  Last Pain:  Vitals:   07/07/18 0627  TempSrc:   PainSc: 0-No pain      Patients Stated Pain Goal: 3 (38/17/71 [id number])  Complications: No apparent anesthesia complications

## 2018-07-07 NOTE — Anesthesia Procedure Notes (Signed)
Procedure Name: Intubation Date/Time: 07/07/2018 7:45 AM Performed by: Garrel Ridgel, CRNA Pre-anesthesia Checklist: Patient identified, Emergency Drugs available, Suction available, Patient being monitored and Timeout performed Patient Re-evaluated:Patient Re-evaluated prior to induction Oxygen Delivery Method: Circle system utilized Preoxygenation: Pre-oxygenation with 100% oxygen Induction Type: IV induction and Cricoid Pressure applied Ventilation: Mask ventilation without difficulty Grade View: Grade II Tube type: Oral Number of attempts: 1 Airway Equipment and Method: Stylet Dental Injury: Teeth and Oropharynx as per pre-operative assessment

## 2018-07-07 NOTE — Interval H&P Note (Signed)
History and Physical Interval Note:  07/07/2018 7:23 AM  Hannah [name]  has presented today for surgery, with the diagnosis of gall bladder disease  The various methods of treatment have been discussed with the patient and family.  She had her last Eliquis Friday.  Her husband and two sons are in the room.  After consideration of risks, benefits and other options for treatment, the patient has consented to  Procedure(s): LAPAROSCOPIC CHOLECYSTECTOMY WITH INTRAOPERATIVE CHOLANGIOGRAM ERAS PATHWAY (N/A) as a surgical intervention .  The patient's history has been reviewed, patient examined, no change in status, stable for surgery.  I have reviewed the patient's chart and labs.  Questions were answered to the patient's satisfaction.     Shann Medal

## 2018-07-08 ENCOUNTER — Encounter (HOSPITAL_COMMUNITY): Payer: Self-pay | Admitting: Surgery

## 2018-07-08 DIAGNOSIS — K8010 Calculus of gallbladder with chronic cholecystitis without obstruction: Secondary | ICD-10-CM | POA: Diagnosis not present

## 2018-07-08 LAB — GLUCOSE, CAPILLARY
Glucose-Capillary: 159 mg/dL — ABNORMAL HIGH (ref 70–99)
Glucose-Capillary: 194 mg/dL — ABNORMAL HIGH (ref 70–99)

## 2018-07-08 MED ORDER — HYDROCODONE-ACETAMINOPHEN 5-325 MG PO TABS: 1.0000 | ORAL_TABLET | ORAL | 0 refills | Status: DC | PRN

## 2018-07-08 NOTE — Discharge Summary (Signed)
Physician Discharge Summary  Patient ID:  Hannah [name]  MRN: [id number]  DOB/AGE: November 24, 1945 73 y.o.  Admit date: 07/07/2018 Discharge date: 07/08/2018  Discharge Diagnoses:  1.  GALL BLADDER DISEASE (K82.9) 2.  BREAST CANCER, STAGE 2, RIGHT (C50.911)             Story: Right biopsy at 12:30 o'clock on 12/14/2015 ([id number]) - which showed 2 areas of IDC, grade 2, ER - 0%, PR - 0%, Ki-67 50 and 70%, Her2Neu - neg.             On mammogram, one area is 1.6 cm and one area is 0.8 cm - they are 1.8 cm apart.             On 01/18/2016, she underwent a right breast lumpectomy, right axillary SLNBx on 01/18/2016. Pathology [id number]) revealed 1.8 cm and 0.9 cm right breast cancer, 0/1 node.             On Letrozole.             Oncology - [facility] 3.  ANTICOAGULATED (Z79.01)             Impression: On Eliquis since early 2019.  4. CAD - sees Dr. Einar [name] History of CABG - 1998, stent in 1999. ARV in 2009 Loop cardiac recorder - 2019 There has been some questions of A fib vs SVT 5. Has bioprosthetic AVR  6. DM - sees Dr. Eduard [name] - on Insulin pump        Most recent Hgb A1C - 6.5 - 05/12/2018 7. Sees Dr. Lamonte [name] for asthma        Averages prednisone 3 to 4 times per year 8. Back issues         She had MRI of her back on 11/15/2015 which showed scoliosis and other changes         She sees Dr. Vertell Limber and has had injections in the past 9. Mini stroke in 2014.        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 History of kidneys stones        Dr. Alyson [name]   Active Problems:   Cholecystitis with cholelithiasis  Operation: Procedure(s): LAPAROSCOPIC CHOLECYSTECTOMY WITH INTRAOPERATIVE CHOLANGIOGRAM on 07/07/2018 - D. [facility]  Discharged Condition: good  Hospital Course: ZENIAH [name] is an 73 y.o. female whose primary  care physician is Hannah [name] and who was admitted 07/07/2018 with a chief complaint of gall bladder disease.   She was brought to the operating room on 07/07/2018 and underwent laparoscopic cholecystectomy with IOC.  She is now one day post op.  She got light headed this AM, but her BP and BS were okay.  Her husband is at the bedside. She is going to stay through lunch.  If she does well, she will go home early afternoon.  The discharge instructions were reviewed with the patient.  Consults: None  Significant Diagnostic Studies: Results for orders placed or performed during the hospital encounter of 07/07/18  Glucose, capillary  Result Value Ref Range   Glucose-Capillary 145 (H) 70 - 99 mg/dL  Glucose, capillary  Result Value Ref Range   Glucose-Capillary 157 (H) 70 - 99 mg/dL  Glucose, capillary  Result Value Ref Range   Glucose-Capillary 169 (H) 70 - 99 mg/dL  Glucose, capillary  Result Value Ref Range   Glucose-Capillary 185 (H) 70 - 99 mg/dL  Glucose, capillary  Result Value Ref Range   Glucose-Capillary 167 (H) 70 - 99 mg/dL  Glucose, capillary  Result Value Ref Range   Glucose-Capillary 159 (H) 70 - 99 mg/dL    Ct Abdomen Pelvis Wo Contrast  Result Date: 06/10/2018 CLINICAL DATA:  Mid abdominal postprandial pain for 1 month with nausea and vomiting. History of right breast cancer with radiation and chemotherapy. EXAM: CT ABDOMEN AND PELVIS WITHOUT CONTRAST TECHNIQUE: Multidetector CT imaging of the abdomen and pelvis was performed following the standard protocol without IV contrast. COMPARISON:  Ultrasound 05/31/2018. Abdominopelvic CT 12/07/2014. Chest CT of 05/21/2018. FINDINGS: Lower chest: Mild dependent atelectasis or scarring in the right lower lobe. The lung bases are otherwise clear. No significant pleural or pericardial effusion. Extensive atherosclerosis of the aortic root and coronary arteries status post median sternotomy, AVR and CABG. Right breast fluid  collection measuring 3.4 x 2.9 cm on image 8/2, similar to recent chest CT. Hepatobiliary: The liver appears unremarkable as imaged in the noncontrast state. The gallbladder is mildly distended. There are small dependent gallstones. No gallbladder wall thickening, surrounding inflammation or biliary ductal dilatation. Pancreas: Stable mild atrophy without focal abnormality or surrounding inflammation. Spleen: Normal in size without focal abnormality. Adrenals/Urinary Tract: Both adrenal glands appear normal. The kidneys, ureters and bladder appear normal. There is no evidence of urinary tract calculus or hydronephrosis. Bilateral pelvic phleboliths are stable. Stomach/Bowel: No evidence of bowel wall thickening, distention or surrounding inflammatory change. The appendix appears normal. Vascular/Lymphatic: There are no enlarged abdominal or pelvic lymph nodes. Extensive aortic and branch vessel atherosclerosis. Reproductive: Stable uterine vascular calcifications. No adnexal mass. Other: Postsurgical changes in the low anterior abdominal wall. No ascites. Probable pelvic floor laxity. Musculoskeletal: New irregularities of the superior and inferior endplates of L3 compared with 2016 CT, not grossly changed from radiographs 10/03/2016 and consistent with nonacute Schmorl's nodes. There is multilevel spondylosis with possible symptomatic spinal stenosis at L2-3 and L3-4. IMPRESSION: 1. No acute findings or explanation for the patient's symptoms. 2. Cholelithiasis with stable mild gallbladder distension, but no gallbladder wall thickening or surrounding inflammation. 3. Stable postsurgical fluid collection in the right breast. No evidence of metastatic disease. 4. No evidence of recurrent urinary tract calculus or hydronephrosis. 5.  Extensive aortic Atherosclerosis (ICD10-I70.0). 6. Lower lumbar spondylosis. Electronically Signed   By: Richardean Sale M.D.   On: 06/10/2018 14:24   Dg Cholangiogram  Operative  Result Date: 07/07/2018 CLINICAL DATA:  Intraoperative cholangiogram during laparoscopic cholecystectomy. EXAM: INTRAOPERATIVE CHOLANGIOGRAM FLUOROSCOPY TIME:  14 seconds COMPARISON:  CT abdomen and pelvis - 06/10/2018 FINDINGS: Intraoperative cholangiographic images of the right upper abdominal quadrant during laparoscopic cholecystectomy are provided for review. Surgical clips overlie the expected location of the gallbladder fossa. Contrast injection demonstrates selective cannulation of the central aspect of the cystic duct. There is passage of contrast through the central aspect of the cystic duct with filling of a non dilated common bile duct. There is passage of contrast though the CBD and into the descending portion of the duodenum. There is minimal reflux of injected contrast into the common hepatic duct and central aspect of the non dilated intrahepatic biliary system. There is minimal opacification of the central aspect of the pancreatic duct which appears nondilated. There are no discrete filling defects within the opacified portions of the biliary system to suggest the presence of choledocholithiasis. IMPRESSION: No evidence of choledocholithiasis. Electronically Signed  By: Sandi Mariscal M.D.   On: 07/07/2018 08:52    Discharge Exam:  Vitals:   07/08/18 0621 07/08/18 0903  BP: (!) 128/50 (!) 121/46  Pulse: 77 75  Resp: 18 16  Temp: 98.3 F (36.8 C) 98.4 F (36.9 C)  SpO2: 95% 97%    General: Mildly obese WF who is alert and generally healthy appearing.  Lungs: Clear to auscultation and symmetric breath sounds. Heart:  RRR. No murmur or rub. Abdomen: Soft. No mass.  No hernia. Normal bowel sounds.     Incisions look good. Extremities:  Good strength and ROM  in upper and lower extremities.  Discharge Medications:   Allergies as of 07/08/2018      Reactions   Amoxicillin Rash, Other (See Comments)   Tolerates Cephalosporins Has patient had a PCN reaction causing  immediate rash, facial/tongue/throat swelling, SOB or lightheadedness with hypotension: No Has patient had a PCN reaction causing severe rash involving mucus membranes or skin necrosis: Yes Has patient had a PCN reaction that required hospitalization No Has patient had a PCN reaction occurring within the last 10 years: No If all of the above answers are "NO", then may proceed with Cephalosporin use.   Imdur [isosorbide Dinitrate] Other (See Comments)   Headache/severe hypotension/Syncope   Tape Other (See Comments)   Must use paper tape   Arimidex [anastrozole] Nausea Only   Latex Itching, Other (See Comments)   Dentist office only   Tetracycline Rash      Medication List    TAKE these medications   ACCU-CHEK GUIDE test strip Generic drug:  glucose blood Use to test blood sugar 3 times daily   acetaminophen 325 MG tablet Commonly known as:  TYLENOL Take 2 tablets (650 mg total) by mouth every 6 (six) hours as needed for mild pain (or Fever >/= 101).   albuterol 108 (90 Base) MCG/ACT inhaler Commonly known as:  PROVENTIL HFA;VENTOLIN HFA Inhale 2 puffs into the lungs every 6 (six) hours as needed for wheezing or shortness of breath.   amiodarone 200 MG tablet Commonly known as:  PACERONE Take 200 mg by mouth daily.   budesonide-formoterol 160-4.5 MCG/ACT inhaler Commonly known as:  SYMBICORT Inhale 2 puffs into the lungs 2 (two) times daily.   ELIQUIS 5 MG Tabs tablet Generic drug:  apixaban TAKE ONE TABLET BY MOUTH TWICE DAILY   exemestane 25 MG tablet Commonly known as:  AROMASIN TAKE ONE TABLET BY MOUTH DAILY AFTER BREAKFAST What changed:    how much to take  how to take this  when to take this  additional instructions   ezetimibe 10 MG tablet Commonly known as:  ZETIA Take 1 tablet (10 mg total) by mouth daily.   fludrocortisone 0.1 MG tablet Commonly known as:  FLORINEF Take 1 tablet (0.1 mg total) by mouth daily.   furosemide 20 MG tablet Commonly  known as:  LASIX Take 1 tablet (20 mg total) by mouth daily as needed for fluid.   HUMALOG KWIKPEN 100 UNIT/ML KwikPen Generic drug:  insulin lispro Inject 2-4 Units into the skin daily as needed (for CBG > 180). Patient states to use this only when V-GO 20 insulin pump is empty. What changed:  Another medication with the same name was changed. Make sure you understand how and when to take each.   insulin lispro 100 UNIT/ML injection Commonly known as:  HUMALOG INJECT 76 UNITS DAILY WITH  V-GO PUMP What changed:    how to take this  when to take this  additional instructions   HYDROcodone-acetaminophen 5-325 MG tablet Commonly known as:  NORCO/VICODIN Take 1-2 tablets by mouth every 4 (four) hours as needed for moderate pain.   loratadine 10 MG tablet Commonly known as:  CLARITIN Take 10 mg by mouth daily as needed for allergies or rhinitis.   metoprolol tartrate 25 MG tablet Commonly known as:  LOPRESSOR May take a total of 4 tablets in a 24 hour period. What changed:    how much to take  how to take this  when to take this  reasons to take this  additional instructions   pantoprazole 40 MG tablet Commonly known as:  PROTONIX Take 1 tablet (40 mg total) by mouth daily.   rosuvastatin 40 MG tablet Commonly known as:  CRESTOR TAKE ONE TABLET BY MOUTH DAILY What changed:  when to take this   sucralfate 1 g tablet Commonly known as:  CARAFATE Take 1 tablet (1 g total) by mouth 4 (four) times daily -  with meals and at bedtime.   TRULICITY 1.5 MW/4.1LK Sopn Generic drug:  Dulaglutide INJECT THE CONTENTS OF 1  PEN SUBCUTANEOUSLY WEEKLY  AS DIRECTED What changed:  See the new instructions.   V-GO 20 Kit 1 each by Does not apply route daily.   zolpidem 5 MG tablet Commonly known as:  AMBIEN TAKE ONE TABLET BY MOUTH AT BEDTIME AS NEEDED FOR SLEEP What changed:    reasons to take this  additional instructions       Disposition: Discharge disposition:  01-Home or Self Care       Discharge Instructions    Diet - low sodium heart healthy   Complete by:  As directed    Increase activity slowly   Complete by:  As directed      Signed: Alphonsa Overall, M.D., [facility] Office:  [phone]  07/08/2018, 10:47 AM

## 2018-07-08 NOTE — Discharge Instructions (Signed)
[facility] [place] [facility] - DISCHARGE INSTRUCTIONS TO PATIENT  Activity:  Driving - May drive in 2 to 4 days, if doing well and off pain meds   Lifting - No lifting more that 15 pounds for 7 days, then no limit  Wound Care:   Leave incision dry until tomorrow, then may shower  Diet:  As tolerated.  Follow up appointment:  Call Dr. Pollie Friar office [facility]) at [phone] for an appointment in 2 to 3 weeks.  Medications and dosages:  Resume your home medications.             Check your blood sugars regularly.  You have a prescription for:  Vicodin  Call Dr. Lucia Gaskins or his office  [phone]) if you have:  Temperature greater than 100.4,  Persistent nausea and vomiting,  Severe uncontrolled pain,  Redness, tenderness, or signs of infection (pain, swelling, redness, odor or green/yellow discharge around the site),  Difficulty breathing, headache or visual disturbances,  Any other questions or concerns you may have after discharge.  In an emergency, call 911 or go to an Emergency Department at a nearby hospital.

## 2018-07-08 NOTE — Telephone Encounter (Signed)
Rx ambien sent to pt pharmacy.

## 2018-07-15 ENCOUNTER — Other Ambulatory Visit: Payer: Self-pay | Admitting: Endocrinology

## 2018-07-15 DIAGNOSIS — E860 Dehydration: Secondary | ICD-10-CM

## 2018-07-16 ENCOUNTER — Encounter: Payer: Self-pay | Admitting: Medical

## 2018-07-23 ENCOUNTER — Telehealth: Payer: Self-pay | Admitting: Endocrinology

## 2018-07-23 NOTE — Telephone Encounter (Signed)
MEDICATION: ACCU-CHEK GUIDE test strip  PHARMACY:  [facility] in [place] : Yes  IS PATIENT OUT OF MEDICATION: No  IF NOT; HOW MUCH IS LEFT: 2 days  LAST APPOINTMENT DATE: @2 /09/2018  NEXT APPOINTMENT DATE:@3 /08/2018  DO WE HAVE YOUR PERMISSION TO LEAVE A DETAILED MESSAGE: Yes  OTHER COMMENTS: Patient states that Pharmacy told patient that the above refill request was denied by our office.   **Let patient know to contact pharmacy at the end of the day to make sure medication is ready. **  ** Please notify patient to allow 48-72 hours to process**  **Encourage patient to contact the pharmacy for refills or they can request refills through [facility]**

## 2018-07-24 ENCOUNTER — Other Ambulatory Visit: Payer: Self-pay

## 2018-07-24 MED ORDER — GLUCOSE BLOOD VI STRP: ORAL_STRIP | 3 refills | Status: DC

## 2018-07-24 NOTE — Telephone Encounter (Signed)
Rx sent 

## 2018-07-28 ENCOUNTER — Ambulatory Visit: Payer: Medicare Other

## 2018-07-29 LAB — CUP PACEART REMOTE DEVICE CHECK
Date Time Interrogation Session: 20200215184132
Implantable Pulse Generator Implant Date: 20190322

## 2018-07-30 ENCOUNTER — Encounter: Payer: Self-pay | Admitting: Medical

## 2018-07-30 ENCOUNTER — Ambulatory Visit (INDEPENDENT_AMBULATORY_CARE_PROVIDER_SITE_OTHER): Payer: Medicare Other | Admitting: Medical

## 2018-07-30 ENCOUNTER — Ambulatory Visit (HOSPITAL_BASED_OUTPATIENT_CLINIC_OR_DEPARTMENT_OTHER)
Admission: RE | Admit: 2018-07-30 | Discharge: 2018-07-30 | Disposition: A | Discharge status: Home / Self Care | Payer: Medicare Other | Source: Ambulatory Visit | Attending: Medical | Admitting: Medical

## 2018-07-30 VITALS — BP 170/70 | HR 67 | Temp 98.2°F | Resp 16 | Ht 65.0 in | Wt 177.2 lb

## 2018-07-30 DIAGNOSIS — R030 Elevated blood-pressure reading, without diagnosis of hypertension: Secondary | ICD-10-CM

## 2018-07-30 DIAGNOSIS — G629 Polyneuropathy, unspecified: Secondary | ICD-10-CM | POA: Diagnosis not present

## 2018-07-30 DIAGNOSIS — M533 Sacrococcygeal disorders, not elsewhere classified: Secondary | ICD-10-CM | POA: Diagnosis present

## 2018-07-30 LAB — VITAMIN B12: Vitamin B-12: 318 pg/mL (ref 211–911)

## 2018-07-30 NOTE — Progress Notes (Signed)
Subjective:    Patient ID: Hannah [name], female    DOB: [birth year], 73 y.o.   MRN: [id number]  HPI  Pt in states she has had some bump in her tailbone area. 2 months ago she states started to get pain. She states about one year ago she fell and landed on her tailbone. It was very painful at that time and it took about 4 weeks for pain to go away. Now just recently pain is worse.  Also she has both feet numbness with tingling. She has known history of neuropathy. Pt is known diabetic. Pt used to be on gabapentin but it did not help. Pt also was on tramadol in the past for her back. She has not taken tramadol for months in the past for back pain. Now only using tylenol.   Pt check bp about one time a week. Today was 150/68. Last check systolic was less than 673 systolic but can remember exact number. No cardiac or neurologic signs or symptoms.   Review of Systems  Constitutional: Negative for chills, fatigue and fever.  Respiratory: Negative for cough, chest tightness and wheezing.   Cardiovascular: Negative for chest pain and palpitations.  Gastrointestinal: Negative for abdominal pain.  Musculoskeletal:       Coccyx pain.  No leg cramps.  Skin: Negative for rash.  Neurological:       Lower ext neuropathy.  Hematological: Negative for adenopathy. Does not bruise/bleed easily.  Psychiatric/Behavioral: Negative for behavioral problems, confusion and sleep disturbance. The patient is not nervous/anxious.     Past Medical History:  Diagnosis Date  . Abnormally small mouth   . Acute on chronic combined systolic and diastolic congestive heart failure ([place]) 07/17/2013  . Adrenal insufficiency ([place]) 07/03/2016  . AKI (acute kidney injury) ([place]) 09/01/2017  . Allergic rhinitis 10/14/2009   Qualifier: Diagnosis of  By: Lamonte Sakai MD, Rose Fillers   Overview:  Overview:  Qualifier: Diagnosis of  By: Lamonte Sakai MD, Rose Fillers  Last Assessment & Plan:  Please continue Xyzal and Nasacort as you have been using  them  . Anemia   . Asthma 05/12/2009   10/12/2014 p extensive coaching HFA effectiveness =    75% s spacer    Overview:  Overview:  10/12/2014 p extensive coaching HFA effectiveness =    75% s spacer   Last Assessment & Plan:  Please continue Symbicort 2 puffs twice a day. Remember to rinse and gargle after taking this medication. Take albuterol 2 puffs up to every 4 hours if needed for shortness of breath.  Follow with Dr Lamonte Sakai in 6 month  . Atrial tachycardia ([place]) 04/03/2013   Overview:  Last Assessment & Plan:  I have discussed the likely benign nature of her problem. She has very minimal palpitations. I doubt atrial flutter. I would not anti-coagulate. Her monitor demonstrates that her episodes are short lived. Will follow.   . Bilateral carotid artery stenosis    Bilateral ICA 40-59%/  >50% LECA  . Breast cancer ([place]) 01/18/2016   right breast  . Breast cancer of upper-inner quadrant of right female breast ([place]) 12/16/2015  . CAD (coronary artery disease) cardiologist-  dr Aundra Dubin   Remote CABG in 1998 with redo in 2009  . Chemotherapy induced nausea and vomiting 02/24/2016  . 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 CHF (congestive heart failure) (HCC)    EF is low normal at 50 to 55% per echo in Jan. 2011  .  Chronic coronary artery disease 01/24/2011   Overview:  Last Assessment & Plan:  Stable with no ischemic symptoms.  Continue ASA, statin, ACEI, beta blocker.   . Chronic diastolic CHF (congestive heart failure) ([place]) 09/24/2013  . CKD (chronic kidney disease), stage IV ([place]) 09/24/2013  . Closed fracture of head of left humerus 09/09/2017  . Complication of anesthesia   . Essential hypertension 05/12/2009   Qualifier: Diagnosis of  By: Lamonte Sakai MD, Rose Fillers   . Gallstones   . GERD (gastroesophageal reflux disease)   . Glaucoma   . H/O atrial tachycardia 05/12/2009   Qualifier: History of  By: Lamonte Sakai MD, Rose Fillers    Overview:  Overview:  Qualifier: History of  By: Lamonte Sakai MD, Rose Fillers  Last Assessment & Plan:  There was no evidence of this on her cardiac monitor. I would recommend watchful waiting.   Marland Kitchen Hearing loss of right ear   . History of aortic valve replacement 10/27/2010   Overview:  Last Assessment & Plan:  Will get echo to reassess bioprosthetic aortic valve.   Marland Kitchen History of non-ST elevation myocardial infarction (NSTEMI)    Sept 2014--  thought to be type II HTN w/ LHC without infarct related artery and patent grafts  . History of radiation therapy 05/24/16-07/26/16   right breast 50.4 Gy in 28 fractions, right breast boost 10 Gy in 5 fractions  . Hyperlipidemia   . Hypertension    Resolved.  . Iron deficiency anemia   . Mania ([place]) 03/05/2016  . Moderate persistent asthma    pulmologist-  Dr. Malvin Johns  . Nonischemic cardiomyopathy ([place])   . Osteomyelitis of toe of left foot ([place]) 04/06/2016  . Osteopenia of multiple sites 10/19/2015  . PAF (paroxysmal atrial fibrillation) ([place])   . Personal history of chemotherapy 2017  . Personal history of radiation therapy 2017  . PONV (postoperative nausea and vomiting)   . Port catheter in place 02/17/2016  . Psoriasis    right leg  . Renal calculus, right   . Renal insufficiency, mild   . S/P AVR    prosthesis valve placement 2009 at same time re-do CABG  . Seizure ([place]) 06/29/2016  . Seizure-like activity ([place]) 09/01/2017  . Sensorineural hearing loss (SNHL) of both ears 01/06/2016  . Stroke [facility])    residual rt hearing loss  . Syncopal episodes 05/21/2018  . Syncope 09/06/2017  . Type 2 diabetes mellitus ([place])    monitored by dr Dwyane Dee     Social History   Socioeconomic History  . Marital status: Married    Spouse name: Not on file  . Number of children: 2  . Years of education: Not on file  . Highest education level: Not on file  Occupational History  . Occupation: retired  Scientific laboratory technician  . Financial resource strain: Not on file  . Food  insecurity:    Worry: Not on file    Inability: Not on file  . Transportation needs:    Medical: Not on file    Non-medical: Not on file  Tobacco Use  . Smoking status: Never Smoker  . Smokeless tobacco: Never Used  Substance and Sexual Activity  . Alcohol use: No  . Drug use: No  . Sexual activity: Yes    Birth control/protection: None  Lifestyle  . Physical activity:    Days per week: Not on file    Minutes per session: Not on file  . Stress: Not on file  Relationships  . Social  connections:    Talks on phone: Not on file    Gets together: Not on file    Attends religious service: Not on file    Active member of club or organization: Not on file    Attends meetings of clubs or organizations: Not on file    Relationship status: Not on file  . Intimate partner violence:    Fear of current or ex partner: Not on file    Emotionally abused: Not on file    Physically abused: Not on file    Forced sexual activity: Not on file  Other Topics Concern  . Not on file  Social History Narrative  . Not on file    Past Surgical History:  Procedure Laterality Date  . AORTIC VALVE REPLACEMENT  2009   [facility] same time is [facility])  . BREAST LUMPECTOMY Right 01/18/2016  . BREAST LUMPECTOMY WITH RADIOACTIVE SEED AND SENTINEL LYMPH NODE BIOPSY Right 01/18/2016   Procedure: RIGHT BREAST LUMPECTOMY WITH RADIOACTIVE SEED AND SENTINEL LYMPH NODE BIOPSY;  Surgeon: DAlphonsa Overall MD;  [facility]  Service: General;  Laterality: Right;  . CARDIAC CATHETERIZATION  03/23/2008   Pre-redo CABG: L main OK, LAD (T), CFX (T), OM1 99%, RCA (T), LIMA-LAD OK, SVG-OM(?3) OK w/ little florw to OM2, SVG-RCA OK. EF NL  . CARPAL TUNNEL RELEASE    . CHOLECYSTECTOMY N/A 07/07/2018   Procedure: LAPAROSCOPIC CHOLECYSTECTOMY WITH INTRAOPERATIVE CHOLANGIOGRAM ERAS PATHWAY;  Surgeon: NAlphonsa Overall MD;  Location: [facility];  Service: General;  Laterality: N/A;  . COLONOSCOPY      around 2015. Possibly with [facility]   . CORONARY ARTERY BYPASS GRAFT  1998 &  re-do 2009   Had LIMA to DX/LAD, SVG to 2 marginal branches and [facility]; SVG to 3rd OM and PD at time of redo  . CYSTOSCOPY W/ URETERAL STENT PLACEMENT Right 12/20/2014   Procedure: CYSTOSCOPY WITH RETROGRADE PYELOGRAM/URETERAL STENT PLACEMENT;  Surgeon: PCleon Gustin MD;  [facility]  Service: Urology;  Laterality: Right;  . ESOPHAGOGASTRODUODENOSCOPY     many years ago per patient   . ESOPHAGOGASTRODUODENOSCOPY ENDOSCOPY  06/17/2018  . EYE SURGERY Bilateral    cataracts  . HOLMIUM LASER APPLICATION Right 79/17/9150  Procedure:  HOLMIUM LASER LITHOTRIPSY;  Surgeon: PCleon Gustin MD;  [facility]  Service: Urology;  Laterality: Right;  . LEFT HEART CATHETERIZATION WITH CORONARY/GRAFT ANGIOGRAM N/A 02/23/2013   Procedure: LEFT HEART CATHETERIZATION WITH CBeatrix Fetters  Surgeon: MBlane Ohara MD;  [facility];  Service: Cardiovascular;  Laterality: N/A;  . LOOP RECORDER INSERTION N/A 08/30/2017   Procedure: LOOP RECORDER INSERTION;  Surgeon: TEvans Lance MD;  [facility];  Service: Cardiovascular;  Laterality: N/A;  . PORTACATH PLACEMENT Left 01/18/2016   Procedure: INSERTION PORT-A-CATH;  Surgeon: DAlphonsa Overall MD;  [facility] [facility]  Service: General;  Laterality: Left;  . portacath removal    . TONSILLECTOMY    . TRANSTHORACIC ECHOCARDIOGRAM  02-24-2013      mild LVH,  ef 50-55%/  AV bioprosthesis was present with very mild stenosis and no regurg., mean grandient 138mg, peak grandient 2061m /  mild MR/  mild LAE and RAE/  moderate TR  . TUBAL LIGATION      Family History  Problem Relation Age of Onset  . Heart disease Father   . Heart failure Father   . Diabetes Maternal  Grandmother   . Heart disease Maternal Grandmother   . Diabetes Son   . Healthy Brother        #1  . Heart attack Brother         #2  . Heart disease Brother        #2  . Colon cancer Neg Hx   . Esophageal cancer Neg Hx     Allergies  Allergen Reactions  . Amoxicillin Rash and Other (See Comments)    Tolerates Cephalosporins Has patient had a PCN reaction causing immediate rash, facial/tongue/throat swelling, SOB or lightheadedness with hypotension: No Has patient had a PCN reaction causing severe rash involving mucus membranes or skin necrosis: Yes Has patient had a PCN reaction that required hospitalization No Has patient had a PCN reaction occurring within the last 10 years: No If all of the above answers are "NO", then may proceed with Cephalosporin use.   . Imdur [Isosorbide Dinitrate] Other (See Comments)    Headache/severe hypotension/Syncope  . Tape Other (See Comments)    Must use paper tape  . Arimidex [Anastrozole] Nausea Only  . Latex Itching and Other (See Comments)    Dentist office only  . Tetracycline Rash    Current Outpatient Medications on File Prior to Visit  Medication Sig Dispense Refill  . acetaminophen (TYLENOL) 325 MG tablet Take 2 tablets (650 mg total) by mouth every 6 (six) hours as needed for mild pain (or Fever >/= 101).    Marland Kitchen albuterol (PROVENTIL HFA;VENTOLIN HFA) 108 (90 Base) MCG/ACT inhaler Inhale 2 puffs into the lungs every 6 (six) hours as needed for wheezing or shortness of breath.    Marland Kitchen amiodarone (PACERONE) 200 MG tablet Take 200 mg by mouth daily.    . budesonide-formoterol (SYMBICORT) 160-4.5 MCG/ACT inhaler Inhale 2 puffs into the lungs 2 (two) times daily. 10.2 g 2  . ELIQUIS 5 MG TABS tablet TAKE ONE TABLET BY MOUTH TWICE DAILY 60 tablet 6  . exemestane (AROMASIN) 25 MG tablet TAKE ONE TABLET BY MOUTH DAILY AFTER BREAKFAST (Patient taking differently: Take 25 mg by mouth daily after breakfast. ) 90 tablet 3  . ezetimibe (ZETIA) 10 MG tablet Take 1 tablet (10 mg total) by mouth daily. 90 tablet 3  . fludrocortisone (FLORINEF) 0.1 MG tablet Take 1 tablet (0.1  mg total) by mouth daily. 90 tablet 3  . furosemide (LASIX) 20 MG tablet Take 1 tablet (20 mg total) by mouth daily as needed for fluid. 30 tablet 0  . glucose blood (ACCU-CHEK GUIDE) test strip Use to test blood sugar 3 times daily 100 each 3  . Insulin Disposable Pump (V-GO 20) KIT 1 each by Does not apply route daily. 3 kit 3  . insulin lispro (HUMALOG KWIKPEN) 100 UNIT/ML KiwkPen Inject 2-4 Units into the skin daily as needed (for CBG > 180). Patient states to use this only when V-GO 20 insulin pump is empty.    . insulin lispro (HUMALOG) 100 UNIT/ML injection Inject 0.76 mLs (76 Units total) into the skin See admin instructions. Uses with V-GO 20 insulin pump 20 mL 3  . loratadine (CLARITIN) 10 MG tablet Take 10 mg by mouth daily as needed for allergies or rhinitis.     . metoprolol tartrate (LOPRESSOR) 25 MG tablet May take a total of 4 tablets in a 24 hour period. (Patient taking differently: Take 25-100 mg by mouth daily as needed (for irreregular heartbeat). ) 30 tablet 3  . pantoprazole (PROTONIX) 40 MG tablet  Take 1 tablet (40 mg total) by mouth daily. 30 tablet 11  . rosuvastatin (CRESTOR) 40 MG tablet TAKE ONE TABLET BY MOUTH DAILY (Patient taking differently: Take 40 mg by mouth at bedtime. ) 90 tablet 3  . sucralfate (CARAFATE) 1 g tablet Take 1 tablet (1 g total) by mouth 4 (four) times daily -  with meals and at bedtime. 700 tablet 0  . TRULICITY 1.5 FV/4.9SW SOPN INJECT THE CONTENTS OF 1  PEN SUBCUTANEOUSLY WEEKLY  AS DIRECTED (Patient taking differently: Inject 1.5 mg into the skin every Saturday. ) 6 mL 2  . zolpidem (AMBIEN) 5 MG tablet TAKE ONE TABLET BY MOUTH AT BEDTIME AS NEEDED FOR SLEEP 30 tablet 1   No current facility-administered medications on file prior to visit.     BP (!) 172/53   Pulse 67   Temp 98.2 F (36.8 C) (Oral)   Resp 16   Ht 5' 5"  (1.651 m)   Wt 177 lb 3.2 oz (80.4 kg)   SpO2 100%   BMI 29.49 kg/m       Objective:   Physical Exam  General  Appearance- Not in acute distress.    Chest and Lung Exam Auscultation: Breath sounds:-Normal. Clear even and unlabored. Adventitious sounds:- No Adventitious sounds.  Cardiovascular Auscultation:Rythm - Regular, rate and rythm. Heart Sounds -Normal heart sounds.  Abdomen Inspection:-Inspection Normal.  Palpation/Perucssion: Palpation and Percussion of the abdomen reveal- Non Tender, No Rebound tenderness, No rigidity(Guarding) and No Palpable abdominal masses.  Liver:-Normal.  Spleen:- Normal.   Back No Mid lumbar spine tenderness to palpation.(but pain in coccyx area and slight bulge to coccyx rt of midline. No pain on straight leg lift. No pain on lateral movements and flexion/extension of the spine.  Lower ext neurologic  L5-S1 sensation intact bilaterally. Normal patellar reflexes bilaterally. No foot drop bilaterally.  pulses intact. Feet warm. Sensation grossly intact.  Neuro CN III-XII grossly intact. No deficits.      Assessment & Plan:  You do have history of severe coccyx region pain after a fall about 1 year ago.  Pain is still periodic/intermittently depending on how you set.  Will get coccyx x-ray today to see if you had a fracture at the time of injury.  Treatment of this pain is difficult due to the fact that you are on blood thinner so we cannot get any NSAIDs and the fact that you have diabetes and prednisone would not be recommended routinely.  So we will first get the x-ray results and decide on potential treatments.  Will likely have to advise conservative measures such as using a doughnut cushion or very soft of pillows.  You do have bilateral neuropathy by description to both lower extremities/feet.  This is likely diabetic neuropathy.  You have used gabapentin and tramadol in the past.  These did not help much.  Other medication options for neuropathy is Lyrica but this is a controlled medication.  At this point would recommend getting B12 and B1 labs to  see if there is other correctable abnormality that might be occurring at the same time last year diabetes.  Your blood pressure is elevated today.  Typically your blood pressure runs on the lower side and you are on Florinef to help support your blood pressure.  Please check your blood pressure daily over the next 5 days and MyChart me the results of those readings.  If your blood pressure is remaining consistently higher than 140/90 then we will need to make  some medication adjustments.  If blood pressure levels are higher than today's readings and let me know even sooner than 5 days as we might need to make adjustments if BP is increasing.  Follow-up in 10 to 14 days or as needed.  Mackie Pai, PA-C

## 2018-07-30 NOTE — Patient Instructions (Signed)
You do have history of severe coccyx region pain after a fall about 1 year ago.  Pain is still periodic/intermittently depending on how you set.  Will get coccyx x-ray today to see if you had a fracture at the time of injury.  Treatment of this pain is difficult due to the fact that you are on blood thinner so we cannot get any NSAIDs and the fact that you have diabetes and prednisone would not be recommended routinely.  So we will first get the x-ray results and decide on potential treatments.  Will likely have to advise conservative measures such as using a doughnut cushion or very soft of pillows.  You do have bilateral neuropathy by description to both lower extremities/feet.  This is likely diabetic neuropathy.  You have used gabapentin and tramadol in the past.  These did not help much.  Other medication options for neuropathy is Lyrica but this is a controlled medication.  At this point would recommend getting B12 and B1 labs to see if there is other correctable abnormality that might be occurring at the same time last year diabetes.  Your blood pressure is elevated today.  Typically your blood pressure runs on the lower side and you are on Florinef to help support your blood pressure.  Please check your blood pressure daily over the next 5 days and MyChart me the results of those readings.  If your blood pressure is remaining consistently higher than 140/90 then we will need to make some medication adjustments.  If blood pressure levels are higher than today's readings and let me know even sooner than 5 days as we might need to make adjustments if BP is increasing.  Follow-up in 10 to 14 days or as needed.

## 2018-08-01 ENCOUNTER — Encounter: Payer: Self-pay | Admitting: Medical

## 2018-08-02 LAB — VITAMIN B1: Vitamin B1 (Thiamine): 14 nmol/L (ref 8–30)

## 2018-08-04 ENCOUNTER — Encounter: Payer: Self-pay | Admitting: Medical

## 2018-08-05 ENCOUNTER — Telehealth: Payer: Self-pay | Admitting: Medical

## 2018-08-05 ENCOUNTER — Telehealth: Payer: Self-pay

## 2018-08-05 NOTE — Telephone Encounter (Signed)
I see in epic rx for ambien on 07-08-2018 with one refill. So if that is correct why does she need refill. Will you look at that and call pharmacy for clarification.

## 2018-08-05 NOTE — Telephone Encounter (Signed)
Opened to review 

## 2018-08-05 NOTE — Telephone Encounter (Signed)
Refill request: Zolpidem  Last RX:06/30/18 Last OV:07/24/18 Next ZO:XWRU scheduled  UDS:11/20/17 CSC:11/20/17 CSR:

## 2018-08-05 NOTE — Telephone Encounter (Signed)
Opened to review twice.

## 2018-08-12 ENCOUNTER — Other Ambulatory Visit: Payer: Medicare Other

## 2018-08-13 ENCOUNTER — Encounter: Payer: Self-pay | Admitting: Nurse Practitioner

## 2018-08-13 ENCOUNTER — Other Ambulatory Visit (INDEPENDENT_AMBULATORY_CARE_PROVIDER_SITE_OTHER): Payer: Medicare Other

## 2018-08-13 ENCOUNTER — Ambulatory Visit (INDEPENDENT_AMBULATORY_CARE_PROVIDER_SITE_OTHER): Payer: Medicare Other | Admitting: Nurse Practitioner

## 2018-08-13 ENCOUNTER — Other Ambulatory Visit: Payer: Self-pay

## 2018-08-13 VITALS — BP 128/60 | HR 60 | Ht 65.0 in | Wt 175.8 lb

## 2018-08-13 DIAGNOSIS — Z79899 Other long term (current) drug therapy: Secondary | ICD-10-CM | POA: Diagnosis not present

## 2018-08-13 DIAGNOSIS — Z794 Long term (current) use of insulin: Secondary | ICD-10-CM | POA: Diagnosis not present

## 2018-08-13 DIAGNOSIS — I5032 Chronic diastolic (congestive) heart failure: Secondary | ICD-10-CM

## 2018-08-13 DIAGNOSIS — I480 Paroxysmal atrial fibrillation: Secondary | ICD-10-CM | POA: Diagnosis not present

## 2018-08-13 DIAGNOSIS — E1165 Type 2 diabetes mellitus with hyperglycemia: Secondary | ICD-10-CM | POA: Diagnosis not present

## 2018-08-13 LAB — HEMOGLOBIN A1C: Hgb A1c MFr Bld: 6.1 % (ref 4.6–6.5)

## 2018-08-13 LAB — BASIC METABOLIC PANEL
BUN: 21 mg/dL (ref 6–23)
CO2: 33 mEq/L — ABNORMAL HIGH (ref 19–32)
Calcium: 8.9 mg/dL (ref 8.4–10.5)
Chloride: 102 mEq/L (ref 96–112)
Creatinine, Ser: 1.4 mg/dL — ABNORMAL HIGH (ref 0.40–1.20)
GFR: 36.87 mL/min — ABNORMAL LOW (ref >60.00)
Glucose, Bld: 130 mg/dL — ABNORMAL HIGH (ref 70–99)
Potassium: 3.2 mEq/L — ABNORMAL LOW (ref 3.5–5.1)
Sodium: 141 mEq/L (ref 135–145)

## 2018-08-13 NOTE — Patient Instructions (Addendum)
We will be checking the following labs today - BMET, CBC, TSH and HPF  Medication Instructions:    Continue with your current medicines.    If you need a refill on your cardiac medications before your next appointment, please call your pharmacy.     Testing/Procedures To Be Arranged:  N/A  Follow-Up:   See me in October as planned with [name]   At [facility], you and your health needs are our priority.  As part of our continuing mission to provide you with exceptional heart care, we have created designated Provider Care Teams.  These Care Teams include your primary Cardiologist (physician) and Advanced Practice Providers (APPs -  Physician Assistants and Nurse Practitioners) who all work together to provide you with the care you need, when you need it.  Special Instructions:  . None  Call the [place] office at [phone] if you have any questions, problems or concerns.

## 2018-08-13 NOTE — Progress Notes (Addendum)
CARDIOLOGY OFFICE NOTE  Date:  08/13/2018    Hannah [name] Date of Birth: Mar 17, 1946 Medical Record #[id number]  PCP:  Mackie Pai, PA-C  Cardiologist:  Atilano Median    Chief Complaint  Patient presents with  . Coronary Artery Disease  . Congestive Heart Failure  . Atrial Fibrillation    Follow up visit - seen for Hannah [name]    History of Present Illness: Hannah [name] is a 73 y.o. female who presents today for a follow up visit. She isa former patient of Hannah [name]. Seen for Hannah [name]. She now follows with meand Hannah [name].   She has a history of CAD s/p CABG, bioprosthetic AVR, CKD,&paroxysmal atrial fibrillation. She had her initial CABG in 1998, then had redo CABG in 2009 with bioprosthetic AVR. She does not remember when she last had documented atrial fibrillation, but she thinks it was a number of years ago, soundedperi-operative around the time of her redo CABG. She was never put on anticoagulation other than Plavixbut had been on dronedaronein the past.   Over the years she has had intermittent issues. These have included tachypalpitations and chest pain. Last cath in 2014 - grafts patent. Myoview in 2015 without ischemia.Event monitor in 8/16 showed atrial tachycardia versus atypical atrial flutter. Toprol XL was increased.She saw Dr Lovena [name] who thought that she had short runs of atrial tachycardia and not atrial flutter. Therefore, she hadnot been anticoagulated until here recently.   Wasdiagnosed with breast cancerin 2017/2018. She hadsurgery.Gotchemo/radiation - no Adriamycin due to her history of CHF.Her course was complicated by volume overload and anemia.  In January of 2018began to haveorthostasis. She was placed on midodrine and her lasix was changed to as needed only.ACE and beta blocker stopped.Ended up getting on amiodarone in October of 2018 for palpitations/atrial arrhythmias. I saw her in December - she was doing  ok - BP started tracking way back up with the Midodrine and we adjusted her medicines. She had a spell of syncope earlier in the year in 2019 and fell - broke her shoulder and sustained a large hematoma. She now has a loop in place.   She was admitted twice in March of 2019 - recurrent syncope - her loop has been ok. EEG without seizure noted.She wasplaced on Florinef. She was taken off her Ultram and Gabapentin. She was not using Lasix.Found to have some PAF this past summer - placed on Eliquis and her aspirin/Plavix were stopped. She did have the return of palpitations - ended up seeing her with Hannah [name] and we opted to increase her amiodarone to BID dosing short term - this helped.   She was in the ER towards the end of the summer - volume overload - treated with IV lasix. I then saw her and she was doing better. Now using just prn Lasix to avoid orthostasis/syncope. We got her echo updated - has grade 2 DD and EF is still ok. Plan was to cut amiodarone back to QD dosing as of October 1.   Last seen by me back at the end of October - seen by Hannah [name] in January of 2020.  She was back on some iron infusions. She did have her gallbladder out earlier this year - did well with that.    Comes in today. Here alone. She is doing ok. She notes that she has begun to have some palpitations again - she tries to just "work thru it" - remembering Hannah [name]  reassurance. She uses her metoprolol sometimes none and sometimes she will take 1 or 2 - she does not need something everyday. She is trying to stay active. Staying on her bland diet - this has helped her lose weight. She was released from Dr. Lucia Gaskins. Overall, no real complaint. Her BP was up at her PCP's - she has been monitoring her readings - they are fine for the most part and not where we need to make changes at this time.   Past Medical History:  Diagnosis Date  . Abnormally small mouth   . Acute on chronic combined systolic and diastolic  congestive heart failure (Hannah [name]) 07/17/2013  . Adrenal insufficiency (Hannah [name]) 07/03/2016  . AKI (acute kidney injury) (Hannah [name]) 09/01/2017  . Allergic rhinitis 10/14/2009   Qualifier: Diagnosis of  By: Lamonte Sakai MD, Hannah [name]   Overview:  Overview:  Qualifier: Diagnosis of  By: Lamonte Sakai MD, Hannah [name]  Last Assessment & Plan:  Please continue Xyzal and Nasacort as you have been using them  . Anemia   . Asthma 05/12/2009   10/12/2014 p extensive coaching HFA effectiveness =    75% s spacer    Overview:  Overview:  10/12/2014 p extensive coaching HFA effectiveness =    75% s spacer   Last Assessment & Plan:  Please continue Symbicort 2 puffs twice a day. Remember to rinse and gargle after taking this medication. Take albuterol 2 puffs up to every 4 hours if needed for shortness of breath.  Follow with Dr Lamonte [name] in 6 month  . Atrial tachycardia ([place]) 04/03/2013   Overview:  Last Assessment & Plan:  I have discussed the likely benign nature of her problem. She has very minimal palpitations. I doubt atrial flutter. I would not anti-coagulate. Her monitor demonstrates that her episodes are short lived. Will follow.   . Bilateral carotid artery stenosis    Bilateral ICA 40-59%/  >50% LECA  . Breast cancer ([place]) 01/18/2016   right breast  . Breast cancer of upper-inner quadrant of right female breast (Hannah [name]) 12/16/2015  . CAD (coronary artery disease) cardiologist-  dr Aundra [name]   Remote CABG in 1998 with redo in 2009  . Chemotherapy induced nausea and vomiting 02/24/2016  . Chemotherapy-induced peripheral neuropathy (Hannah [name]) 04/27/2016  . 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 CHF (congestive heart failure) (Hannah [name])    EF is low normal at 50 to 55% per echo in Jan. 2011  . Chronic coronary artery disease 01/24/2011   Overview:  Last Assessment & Plan:  Stable with no ischemic symptoms.  Continue ASA, statin, ACEI, beta blocker.   . Chronic diastolic CHF (congestive heart failure) (Hannah [name]) 09/24/2013  . CKD (chronic kidney disease),  stage IV (Hannah [name]) 09/24/2013  . Closed fracture of head of left humerus 09/09/2017  . Complication of anesthesia   . Essential hypertension 05/12/2009   Qualifier: Diagnosis of  By: Lamonte Sakai MD, Hannah [name]   . Gallstones   . GERD (gastroesophageal reflux disease)   . Glaucoma   . H/O atrial tachycardia 05/12/2009   Qualifier: History of  By: Lamonte Sakai MD, Hannah [name]   Overview:  Overview:  Qualifier: History of  By: Lamonte Sakai MD, Hannah [name]  Last Assessment & Plan:  There was no evidence of this on her cardiac monitor. I would recommend watchful waiting.   Marland Kitchen Hearing loss of right ear   . History of aortic valve replacement 10/27/2010   Overview:  Last Assessment & Plan:  Will get echo to reassess bioprosthetic aortic valve.   Marland Kitchen  History of non-ST elevation myocardial infarction (NSTEMI)    Sept 2014--  thought to be type II HTN w/ LHC without infarct related artery and patent grafts  . History of radiation therapy 05/24/16-07/26/16   right breast 50.4 Gy in 28 fractions, right breast boost 10 Gy in 5 fractions  . Hyperlipidemia   . Hypertension    Resolved.  . Iron deficiency anemia   . Mania ([place]) 03/05/2016  . Moderate persistent asthma    pulmologist-  Dr. Malvin Johns  . Nonischemic cardiomyopathy ([place])   . Osteomyelitis of toe of left foot ([place]) 04/06/2016  . Osteopenia of multiple sites 10/19/2015  . PAF (paroxysmal atrial fibrillation) ([place])   . Personal history of chemotherapy 2017  . Personal history of radiation therapy 2017  . PONV (postoperative nausea and vomiting)   . Port catheter in place 02/17/2016  . Psoriasis    right leg  . Renal calculus, right   . Renal insufficiency, mild   . S/P AVR    prosthesis valve placement 2009 at same time re-do CABG  . Seizure ([place]) 06/29/2016  . Seizure-like activity ([place]) 09/01/2017  . Sensorineural hearing loss (SNHL) of both ears 01/06/2016  . Stroke [facility])    residual rt hearing loss  . Syncopal episodes 05/21/2018  . Syncope 09/06/2017  . Type 2 diabetes  mellitus ([place])    monitored by dr Dwyane Dee    Past Surgical History:  Procedure Laterality Date  . AORTIC VALVE REPLACEMENT  2009   [facility]done same time is [facility]G)  . BREAST LUMPECTOMY Right 01/18/2016  . BREAST LUMPECTOMY WITH RADIOACTIVE SEED AND SENTINEL LYMPH NODE BIOPSY Right 01/18/2016   Procedure: RIGHT BREAST LUMPECTOMY WITH RADIOACTIVE SEED AND SENTINEL LYMPH NODE BIOPSY;  Surgeon: DAlphonsa Overall MD;  [place]  Service: General;  Laterality: Right;  . CARDIAC CATHETERIZATION  03/23/2008   Pre-redo CABG: L main OK, LAD (T), CFX (T), OM1 99%, RCA (T), LIMA-LAD OK, SVG-OM(?3) OK w/ little florw to OM2, SVG-RCA OK. EF NL  . CARPAL TUNNEL RELEASE    . CHOLECYSTECTOMY N/A 07/07/2018   Procedure: LAPAROSCOPIC CHOLECYSTECTOMY WITH INTRAOPERATIVE CHOLANGIOGRAM ERAS PATHWAY;  Surgeon: NAlphonsa Overall MD;  Location: [facility];  Service: General;  Laterality: N/A;  . COLONOSCOPY     around 2015. Possibly with [facility]   . CORONARY ARTERY BYPASS GRAFT  1998 &  re-do 2009   Had LIMA to DX/LAD, SV[facility] 2 marginal [facility]; SVG to 3rd OM and PD at time of redo  . CYSTOSCOPY W/ URETERAL STENT PLACEMENT Right 12/20/2014   Procedure: CYSTOSCOPY WITH RETROGRADE PYELOGRAM/URETERAL STENT PLACEMENT;  Surgeon: PCleon Gustin MD;  [facility]  Service: Urology;  Laterality: Right;  . ESOPHAGOGASTRODUODENOSCOPY     many years ago per patient   . ESOPHAGOGASTRODUODENOSCOPY ENDOSCOPY  06/17/2018  . EYE SURGERY Bilateral    cataracts  . HOLMIUM LASER APPLICATION Right 76/26/9485  Procedure:  HOLMIUM LASER LITHOTRIPSY;  Surgeon: PCleon Gustin MD;  [facility]  Service: Urology;  Laterality: Right;  . LEFT HEART CATHETERIZATION WITH CORONARY/GRAFT ANGIOGRAM N/A 02/23/2013   Procedure: LEFT HEART CATHETERIZATION WITH CBeatrix Fetters  Surgeon: MBlane Ohara MD;  [facility];  Service: Cardiovascular;  Laterality: N/A;  . LOOP RECORDER INSERTION N/A 08/30/2017   Procedure: LOOP RECORDER INSERTION;  Surgeon: TEvans Lance MD;  [facility];  Service:  Cardiovascular;  Laterality: N/A;  . PORTACATH PLACEMENT Left 01/18/2016   Procedure: INSERTION PORT-A-CATH;  Surgeon: Alphonsa Overall, MD;  Location: [place];  Service: General;  Laterality: Left;  . portacath removal    . TONSILLECTOMY    . TRANSTHORACIC ECHOCARDIOGRAM  02-24-2013      mild LVH,  ef 50-55%/  AV bioprosthesis was present with very mild stenosis and no regurg., mean grandient 55mHg, peak grandient 254mg /  mild MR/  mild LAE and RAE/  moderate TR  . TUBAL LIGATION       Medications: Current Meds  Medication Sig  . acetaminophen (TYLENOL) 325 MG tablet Take 2 tablets (650 mg total) by mouth every 6 (six) hours as needed for mild pain (or Fever >/= 101).  . Marland Kitchenlbuterol (PROVENTIL HFA;VENTOLIN HFA) 108 (90 Base) MCG/ACT inhaler Inhale 2 puffs into the lungs every 6 (six) hours as needed for wheezing or shortness of breath.  . Marland Kitchenmiodarone (PACERONE) 200 MG tablet Take 200 mg by mouth daily.  . budesonide-formoterol (SYMBICORT) 160-4.5 MCG/ACT inhaler Inhale 2 puffs into the lungs 2 (two) times daily.  . Marland KitchenLIQUIS 5 MG TABS tablet TAKE ONE TABLET BY MOUTH TWICE DAILY  . exemestane (AROMASIN) 25 MG tablet TAKE ONE TABLET BY MOUTH DAILY AFTER BREAKFAST (Patient taking differently: Take 25 mg by mouth daily after breakfast. )  . ezetimibe (ZETIA) 10 MG tablet Take 1 tablet (10 mg total) by mouth daily.  . fludrocortisone (FLORINEF) 0.1 MG tablet Take 1 tablet (0.1 mg total) by mouth daily.  . furosemide (LASIX) 20 MG tablet Take 1 tablet (20 mg total) by mouth daily as needed for fluid.  . Marland Kitchenlucose blood (ACCU-CHEK GUIDE) test strip Use to test blood sugar 3 times daily  . Insulin Disposable Pump (V-GO 20) KIT 1 each by Does not apply route daily.  . insulin lispro (HUMALOG KWIKPEN) 100 UNIT/ML  KiwkPen Inject 2-4 Units into the skin daily as needed (for CBG > 180). Patient states to use this only when V-GO 20 insulin pump is empty.  . insulin lispro (HUMALOG) 100 UNIT/ML injection Inject 0.76 mLs (76 Units total) into the skin See admin instructions. Uses with V-GO 20 insulin pump  . loratadine (CLARITIN) 10 MG tablet Take 10 mg by mouth daily as needed for allergies or rhinitis.   . metoprolol tartrate (LOPRESSOR) 25 MG tablet May take a total of 4 tablets in a 24 hour period. (Patient taking differently: Take 25-100 mg by mouth daily as needed (for irreregular heartbeat). )  . pantoprazole (PROTONIX) 40 MG tablet Take 1 tablet (40 mg total) by mouth daily.  . rosuvastatin (CRESTOR) 40 MG tablet TAKE ONE TABLET BY MOUTH DAILY (Patient taking differently: Take 40 mg by mouth at bedtime. )  . TRULICITY 1.5 MGZO/1.0RUOPN INJECT THE CONTENTS OF 1  PEN SUBCUTANEOUSLY WEEKLY  AS DIRECTED (Patient taking differently: Inject 1.5 mg into the skin every Saturday. )  . zolpidem (AMBIEN) 5 MG tablet TAKE ONE TABLET BY MOUTH AT BEDTIME AS NEEDED FOR SLEEP  . [DISCONTINUED] sucralfate (CARAFATE) 1 g tablet Take 1 tablet (1 g total) by mouth 4 (four) times daily -  with meals and at bedtime.     Allergies: Allergies  Allergen Reactions  . Amoxicillin Rash and Other (See Comments)    Tolerates Cephalosporins Has patient had a PCN reaction causing immediate rash, facial/tongue/throat swelling, SOB or lightheadedness with hypotension: No Has patient had a PCN reaction causing severe rash involving mucus membranes or skin  necrosis: Yes Has patient had a PCN reaction that required hospitalization No Has patient had a PCN reaction occurring within the last 10 years: No If all of the above answers are "NO", then may proceed with Cephalosporin use.   . Imdur [Isosorbide Dinitrate] Other (See Comments)    Headache/severe hypotension/Syncope  . Tape Other (See Comments)    Must use paper tape  .  Arimidex [Anastrozole] Nausea Only  . Latex Itching and Other (See Comments)    Dentist office only  . Tetracycline Rash    Social History: The patient  reports that she has never smoked. She has never used smokeless tobacco. She reports that she does not drink alcohol or use drugs.   Family History: The patient's family history includes Diabetes in her maternal grandmother and son; Healthy in her brother; Heart attack in her brother; Heart disease in her brother, father, and maternal grandmother; Heart failure in her father.   Review of Systems: Please see the history of present illness.   Otherwise, the review of systems is positive for none.   All other systems are reviewed and negative.   Physical Exam: VS:  BP 128/60 (BP Location: Left Arm, Patient Position: Sitting, Cuff Size: Normal)   Pulse 60   Ht _0  (1.651 m)   Wt 175 lb 12.8 oz (79.7 kg)   BMI 29.25 kg/m  .  BMI Body mass index is 29.25 kg/m.  Wt Readings from Last 3 Encounters:  08/13/18 175 lb 12.8 oz (79.7 kg)  07/30/18 177 lb 3.2 oz (80.4 kg)  07/07/18 175 lb (79.4 kg)    General: Pleasant. Alert and in no acute distress. She looks good today. HEENT: Normal.  Neck: Supple, no JVD, carotid bruits, or masses noted.  Cardiac: Regular rate and rhythm. Occasional ectopic. Outflow murmur noted. No edema.  Respiratory:  Lungs are clear to auscultation bilaterally with normal work of breathing.  GI: Soft and nontender.  MS: No deformity or atrophy. Gait and ROM intact.  Skin: Warm and dry. Color is normal.  Neuro:  Strength and sensation are intact and no gross focal deficits noted.  Psych: Alert, appropriate and with normal affect.   LABORATORY DATA:  EKG:  EKG is not ordered today.  Lab Results  Component Value Date   WBC 6.2 07/04/2018   HGB 10.7 (L) 07/04/2018   HCT 34.7 (L) 07/04/2018   PLT 130 (L) 07/04/2018   GLUCOSE 168 (H) 07/04/2018   CHOL 110 05/12/2018   TRIG 82.0 05/12/2018   HDL 33.70 (L)  05/12/2018   LDLDIRECT 53.0 10/08/2016   LDLCALC 59 05/12/2018   ALT 57 (H) 07/04/2018   AST 54 (H) 07/04/2018   NA 141 07/04/2018   K 3.1 (L) 07/04/2018   CL 104 07/04/2018   CREATININE 1.61 (H) 07/04/2018   BUN 20 07/04/2018   CO2 29 07/04/2018   TSH 1.980 12/25/2017   INR 1.91 05/21/2018   HGBA1C 6.5 (H) 07/04/2018   MICROALBUR 53.4 (H) 05/12/2018     BNP (last 3 results) Recent Labs    09/06/17 1530 02/04/18 0933  BNP 58.0 222.2*    ProBNP (last 3 results) No results for input(s): PROBNP in the last 8760 hours.   Other Studies Reviewed Today:  EchoStudy Conclusions8/2019  - Left ventricle: The cavity size was normal. Systolic function was normal. The estimated ejection fraction was in the range of 60% to 65%. Wall motion was normal; there were no regional wall motion abnormalities. Features are  consistent with a pseudonormal left ventricular filling pattern, with concomitant abnormal relaxation and increased filling pressure (grade 2 diastolic dysfunction). Doppler parameters are consistent with high ventricular filling pressure. - Aortic valve: A bioprosthesis was present and functioning normally. Mean gradient (S): 16 mm Hg. - Mitral valve: Calcified annulus. There was mild regurgitation. - Left atrium: The atrium was moderately dilated. - Right ventricle: The cavity size was mildly dilated. Wall thickness was normal. - Tricuspid valve: There was mild regurgitation. - Pulmonary arteries: Systolic pressure could not be accurately estimated.  EchoStudy ConclusionsDecember 2018  - Left ventricle: The cavity size was normal. Wall thickness was increased in a pattern of severe LVH. Systolic function was normal. The estimated ejection fraction was in the range of 60% to 65%. - Aortic valve: A bioprosthesis was present.   MYOVIEW FINDINGS FROM 06/2013: Normal resting EKG. Slight ST depressions noted in aVL  after administration of lexiscan. Mild shortness of breath and nausea resolved after the test. EKG is nondiagnostic for ischemia. TID ratio 1.05. Lung-heart ratio 0.43. Normal ventricular chamber size.  IMPRESSION: No evidence for ischemia. Normal wall motion. LVEF 72%.   Electronically Signed By: Guy Sandifer On: 06/15/2013 14:27   Assessment/Plan:  1.Chronic diastolic HF - looks good today. No changes made. Not using Lasix.   2. Prior recurrent spells of palpitations -needed increase in her dose of amiodarone last year - now back to 200 mg a day - she may be having some recurrence - she feels like this is ok now with the current plan of using prn Metoprolol.   3.Unexplained syncope-she has ILR in place. She has had AF noted. She continues to follow with EP  4.History of atrial tach  5.History oforthostasis/syncope- she remains onlow dose Florinef -we will continue this for now.  6. CAD- prior CABG - she has no active symptoms. Continue with the current regimen.  Last LHC showed stable anatomy with patent grafts except for SVG-RCA from older CABG.   7. Anemia - Now getting IV iron per nephrology.   8. CKD- followed by nephrology - Dr. Posey Pronto  9. Carotid disease- noted 1 to 39% bilateral stenosis from study 10/2017 - will need repeat study 10/2019 - she is aware.   10. Recent lap chole - doing well.    11. High risk medicine - needs follow up lab today. She had a chest CTA back in December - no pulmonary issues noted.   12. HTN - BP diary from home looks great. BP here today is great. Would leave on her current regimen for now.   Current medicines are reviewed with the patient today.  The patient does not have concerns regarding medicines other than what has been noted above.  The following changes have been made:  See above.  Labs/ tests ordered today include:    Orders Placed This Encounter  Procedures  . Basic metabolic panel  .  CBC  . Hepatic function panel  . TSH     Disposition:   FU with me in 4 months.   Patient is agreeable to this plan and will call if any problems develop in the interim.   SignedTruitt Merle, NP  08/13/2018 2:04 PM  [facility] [address] [place], [place]  [address] Phone: [phone] Fax: [phone]

## 2018-08-14 ENCOUNTER — Telehealth: Payer: Self-pay

## 2018-08-14 DIAGNOSIS — E876 Hypokalemia: Secondary | ICD-10-CM

## 2018-08-14 LAB — HEPATIC FUNCTION PANEL
ALT: 38 IU/L — ABNORMAL HIGH (ref 0–32)
AST: 50 IU/L — ABNORMAL HIGH (ref 0–40)
Albumin: 3.3 g/dL — ABNORMAL LOW (ref 3.7–4.7)
Alkaline Phosphatase: 96 IU/L (ref 39–117)
Bilirubin Total: 0.7 mg/dL (ref 0.0–1.2)
Bilirubin, Direct: 0.28 mg/dL (ref 0.00–0.40)
Total Protein: 6 g/dL (ref 6.0–8.5)

## 2018-08-14 LAB — CBC
Hematocrit: 32.8 % — ABNORMAL LOW (ref 34.0–46.6)
Hemoglobin: 10.5 g/dL — ABNORMAL LOW (ref 11.1–15.9)
MCH: 32.3 pg (ref 26.6–33.0)
MCHC: 32 g/dL (ref 31.5–35.7)
MCV: 101 fL — ABNORMAL HIGH (ref 79–97)
Platelets: 133 10*3/uL — ABNORMAL LOW (ref 150–450)
RBC: 3.25 x10E6/uL — ABNORMAL LOW (ref 3.77–5.28)
RDW: 12 % (ref 11.7–15.4)
WBC: 7.1 10*3/uL (ref 3.4–10.8)

## 2018-08-14 LAB — BASIC METABOLIC PANEL
BUN/Creatinine Ratio: 14 (ref 12–28)
BUN: 19 mg/dL (ref 8–27)
CO2: 28 mmol/L (ref 20–29)
Calcium: 8.6 mg/dL — ABNORMAL LOW (ref 8.7–10.3)
Chloride: 102 mmol/L (ref 96–106)
Creatinine, Ser: 1.4 mg/dL — ABNORMAL HIGH (ref 0.57–1.00)
GFR calc Af Amer: 43 mL/min/{1.73_m2} — ABNORMAL LOW (ref >59)
GFR calc non Af Amer: 38 mL/min/{1.73_m2} — ABNORMAL LOW (ref >59)
Glucose: 142 mg/dL — ABNORMAL HIGH (ref 65–99)
Potassium: 3.1 mmol/L — ABNORMAL LOW (ref 3.5–5.2)
Sodium: 143 mmol/L (ref 134–144)

## 2018-08-14 LAB — TSH: TSH: 2.32 u[IU]/mL (ref 0.450–4.500)

## 2018-08-14 MED ORDER — POTASSIUM CHLORIDE CRYS ER 20 MEQ PO TBCR: 20.0000 meq | EXTENDED_RELEASE_TABLET | Freq: Every day | ORAL | 3 refills | Status: DC

## 2018-08-14 NOTE — Telephone Encounter (Signed)
Spoke with the patient, she expressed understanding about her results. She stated she would take Potassium 20 meq, daily. She stated she did not need any sent in, due to have some from a previous prescription. She confirmed the dose and the expiration date. I advised her to call our office once she needs a refill. She is scheduled for repeat BMET on 08/29/18.

## 2018-08-14 NOTE — Telephone Encounter (Signed)
-----   Message from [name], NP sent at 08/14/2018 12:58 PM EST ----- Please call - let her know that her kidney function is improving - this is good. Blood count is stable. Liver tests are improving as well.  Her potassium remains low - can she increase her oral intake on a daily basis or can we start Kdur 20 meq daily. Either choice - would like BMET in 2 weeks to recheck please.

## 2018-08-15 ENCOUNTER — Other Ambulatory Visit: Payer: Self-pay

## 2018-08-15 ENCOUNTER — Ambulatory Visit (INDEPENDENT_AMBULATORY_CARE_PROVIDER_SITE_OTHER): Payer: Medicare Other | Admitting: Endocrinology

## 2018-08-15 ENCOUNTER — Encounter: Payer: Self-pay | Admitting: Endocrinology

## 2018-08-15 VITALS — BP 140/48 | HR 62 | Ht 65.0 in | Wt 173.0 lb

## 2018-08-15 DIAGNOSIS — E1165 Type 2 diabetes mellitus with hyperglycemia: Secondary | ICD-10-CM | POA: Diagnosis not present

## 2018-08-15 DIAGNOSIS — G629 Polyneuropathy, unspecified: Secondary | ICD-10-CM

## 2018-08-15 DIAGNOSIS — Z794 Long term (current) use of insulin: Secondary | ICD-10-CM

## 2018-08-15 DIAGNOSIS — N183 Chronic kidney disease, stage 3 unspecified: Secondary | ICD-10-CM

## 2018-08-15 NOTE — Patient Instructions (Signed)
No Trulicity

## 2018-08-15 NOTE — Progress Notes (Signed)
Patient ID: Hannah [name], female   DOB: 1946/05/28, 73 y.o.   MRN: [id number]           Reason for Appointment: Follow-up for Type 2 Diabetes   History of Present Illness:          Date of diagnosis of type 2 diabetes mellitus: Late 1980s        Background history:  She was previously treated with metformin prior to starting insulin She thinks she has been on insulin for at least 10 years and mostly taking Lantus and other types of insulin with it Her metformin was stopped when she started insulin and also has had renal dysfunction Previously her A1c had been consistently over 8% and occasionally higher.  Recent history:   INSULIN regimen is described as:  V-go 20 unit basal, mealtime coverage 8 u acB, lunch 1-2 clicks and 6-8 acS  Non-insulin hypoglycemic drugs the patient is taking are: Trulicity 1.5 mg weekly  She has been on the V-go pump since 03/2015, on the 30 unit pump since about 08/2017  Most recent A1c is 6.1 and lower than before   Current diabetes management, blood sugar patterns and problems identified:   She is now using the 20 unit pump since her last visit because of tendency to morning hypoglycemia  Surprisingly she is still getting readings in the 60s periodically in the morning  She thinks that she has to keep her blood sugar higher than expected late at night especially to keep her from getting low sugar reactions early morning  Also because of tendency to low sugars she is only bolusing 2 to 4 units for most meals  She is not eating normally because of GI problems and recent gallbladder surgery, tends to have some limitations of what she is eating  No nausea with Trulicity which she has taken previously  Blood sugars may be sporadically higher at different times including after  Since she does not take enough boluses for her meals to prevent hypoglycemia  She is changing her V-go pump around the same time daily but will sometimes take it off early  because of low sugars    Side effects from medications have been: None  Compliance with the medical regimen: Fair  Glucose monitoring:  done about 2 + times a day         Glucometer:  Accu-Chek [facility] Blood Glucose readings by download for the last 4 weeks:   PRE-MEAL Fasting Lunch Dinner Bedtime Overall  Glucose range:  62-156    78-253   Mean/median:  90   174  173  142   POST-MEAL PC Breakfast PC Lunch PC Dinner  Glucose range:     Mean/median:   162    PREVIOUS readings:  PRE-MEAL Fasting Lunch Dinner Bedtime Overall  Glucose range:  62-146    67-276   Mean/median:  94  128  150  161  136+/-61   POST-MEAL PC Breakfast PC Lunch PC Dinner  Glucose range:     Mean/median:   173     Self-care: The diet that the patient has been following is: tries to limit fats and carbs.  She uses diet green tea or diet drinks and no sugar in drinks    Lunch 1-2 pm Dinner 6-7 PM  Typical meal intake: Breakfast is oatrmeal or cheese toast              Dietician visit, most recent: 5/16 And in 7/16 with oncology dietitian  Exercise:  At the gym, 2-3/7 days  Weight history:  Wt Readings from Last 3 Encounters:  08/15/18 173 lb (78.5 kg)  08/13/18 175 lb 12.8 oz (79.7 kg)  07/30/18 177 lb 3.2 oz (80.4 kg)    Glycemic control:      Lab Results  Component Value Date   HGBA1C 6.1 08/13/2018   HGBA1C 6.5 (H) 07/04/2018   HGBA1C 6.5 05/12/2018   Lab Results  Component Value Date   MICROALBUR 53.4 (H) 05/12/2018   LDLCALC 59 05/12/2018   CREATININE 1.40 (H) 08/13/2018       Allergies as of 08/15/2018      Reactions   Amoxicillin Rash, Other (See Comments)   Tolerates Cephalosporins Has patient had a PCN reaction causing immediate rash, facial/tongue/throat swelling, SOB or lightheadedness with hypotension: No Has patient had a PCN reaction causing severe rash involving mucus membranes or skin necrosis: Yes Has patient had a PCN reaction that required  hospitalization No Has patient had a PCN reaction occurring within the last 10 years: No If all of the above answers are "NO", then may proceed with Cephalosporin use.   Imdur [isosorbide Dinitrate] Other (See Comments)   Headache/severe hypotension/Syncope   Tape Other (See Comments)   Must use paper tape   Arimidex [anastrozole] Nausea Only   Latex Itching, Other (See Comments)   Dentist office only   Tetracycline Rash      Medication List       Accurate as of August 15, 2018 11:02 AM. Always use your most recent med list.        acetaminophen 325 MG tablet Commonly known as:  TYLENOL Take 2 tablets (650 mg total) by mouth every 6 (six) hours as needed for mild pain (or Fever >/= 101).   albuterol 108 (90 Base) MCG/ACT inhaler Commonly known as:  PROVENTIL HFA;VENTOLIN HFA Inhale 2 puffs into the lungs every 6 (six) hours as needed for wheezing or shortness of breath.   amiodarone 200 MG tablet Commonly known as:  PACERONE Take 200 mg by mouth daily.   budesonide-formoterol 160-4.5 MCG/ACT inhaler Commonly known as:  Symbicort Inhale 2 puffs into the lungs 2 (two) times daily.   Eliquis 5 MG Tabs tablet Generic drug:  apixaban TAKE ONE TABLET BY MOUTH TWICE DAILY   exemestane 25 MG tablet Commonly known as:  AROMASIN TAKE ONE TABLET BY MOUTH DAILY AFTER BREAKFAST   ezetimibe 10 MG tablet Commonly known as:  ZETIA Take 1 tablet (10 mg total) by mouth daily.   fludrocortisone 0.1 MG tablet Commonly known as:  FLORINEF Take 1 tablet (0.1 mg total) by mouth daily.   furosemide 20 MG tablet Commonly known as:  LASIX Take 1 tablet (20 mg total) by mouth daily as needed for fluid.   glucose blood test strip Commonly known as:  Accu-Chek Guide Use to test blood sugar 3 times daily   HumaLOG KwikPen 100 UNIT/ML KwikPen Generic drug:  insulin lispro Inject 2-4 Units into the skin daily as needed (for CBG > 180). Patient states to use this only when V-GO 20 insulin  pump is empty.   insulin lispro 100 UNIT/ML injection Commonly known as:  HumaLOG Inject 0.76 mLs (76 Units total) into the skin See admin instructions. Uses with V-GO 20 insulin pump   loratadine 10 MG tablet Commonly known as:  CLARITIN Take 10 mg by mouth daily as needed for allergies or rhinitis.   metoprolol tartrate 25 MG tablet Commonly known as:  LOPRESSOR May take a total of 4 tablets in a 24 hour period.   pantoprazole 40 MG tablet Commonly known as:  Protonix Take 1 tablet (40 mg total) by mouth daily.   potassium chloride SA 20 MEQ tablet Commonly known as:  K-DUR,KLOR-CON Take 1 tablet (20 mEq total) by mouth daily.   rosuvastatin 40 MG tablet Commonly known as:  CRESTOR TAKE ONE TABLET BY MOUTH DAILY   Trulicity 1.5 PZ/0.2HE Sopn Generic drug:  Dulaglutide INJECT THE CONTENTS OF 1  PEN SUBCUTANEOUSLY WEEKLY  AS DIRECTED   V-Go 20 Kit 1 each by Does not apply route daily.   zolpidem 5 MG tablet Commonly known as:  AMBIEN TAKE ONE TABLET BY MOUTH AT BEDTIME AS NEEDED FOR SLEEP       Allergies:  Allergies  Allergen Reactions  . Amoxicillin Rash and Other (See Comments)    Tolerates Cephalosporins Has patient had a PCN reaction causing immediate rash, facial/tongue/throat swelling, SOB or lightheadedness with hypotension: No Has patient had a PCN reaction causing severe rash involving mucus membranes or skin necrosis: Yes Has patient had a PCN reaction that required hospitalization No Has patient had a PCN reaction occurring within the last 10 years: No If all of the above answers are "NO", then may proceed with Cephalosporin use.   . Imdur [Isosorbide Dinitrate] Other (See Comments)    Headache/severe hypotension/Syncope  . Tape Other (See Comments)    Must use paper tape  . Arimidex [Anastrozole] Nausea Only  . Latex Itching and Other (See Comments)    Dentist office only  . Tetracycline Rash    Past Medical History:  Diagnosis Date  .  Abnormally small mouth   . Acute on chronic combined systolic and diastolic congestive heart failure ([place]) 07/17/2013  . Adrenal insufficiency ([place]) 07/03/2016  . AKI (acute kidney injury) ([place]) 09/01/2017  . Allergic rhinitis 10/14/2009   Qualifier: Diagnosis of  By: Lamonte Sakai MD, Rose Fillers   Overview:  Overview:  Qualifier: Diagnosis of  By: Lamonte Sakai MD, Rose Fillers  Last Assessment & Plan:  Please continue Xyzal and Nasacort as you have been using them  . Anemia   . Asthma 05/12/2009   10/12/2014 p extensive coaching HFA effectiveness =    75% s spacer    Overview:  Overview:  10/12/2014 p extensive coaching HFA effectiveness =    75% s spacer   Last Assessment & Plan:  Please continue Symbicort 2 puffs twice a day. Remember to rinse and gargle after taking this medication. Take albuterol 2 puffs up to every 4 hours if needed for shortness of breath.  Follow with Dr Lamonte Sakai in 6 month  . Atrial tachycardia ([place]) 04/03/2013   Overview:  Last Assessment & Plan:  I have discussed the likely benign nature of her problem. She has very minimal palpitations. I doubt atrial flutter. I would not anti-coagulate. Her monitor demonstrates that her episodes are short lived. Will follow.   . Bilateral carotid artery stenosis    Bilateral ICA 40-59%/  >50% LECA  . Breast cancer ([place]) 01/18/2016   right breast  . Breast cancer of upper-inner quadrant of right female breast ([place]) 12/16/2015  . CAD (coronary artery disease) cardiologist-  dr Aundra Dubin   Remote CABG in 1998 with redo in 2009  . Chemotherapy induced nausea and vomiting 02/24/2016  . Chemotherapy-induced peripheral neuropathy ([place]) 04/27/2016  . 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 CHF (congestive heart failure) (H[facility])    EF is low normal at  50 to 55% per echo in Jan. 2011  . Chronic coronary artery disease 01/24/2011   Overview:  Last Assessment & Plan:  Stable with no ischemic symptoms.  Continue ASA, statin, ACEI, beta blocker.   . Chronic diastolic CHF  (congestive heart failure) ([place]) 09/24/2013  . CKD (chronic kidney disease), stage IV ([place]) 09/24/2013  . Closed fracture of head of left humerus 09/09/2017  . Complication of anesthesia   . Essential hypertension 05/12/2009   Qualifier: Diagnosis of  By: Lamonte Sakai MD, Rose Fillers   . Gallstones   . GERD (gastroesophageal reflux disease)   . Glaucoma   . H/O atrial tachycardia 05/12/2009   Qualifier: History of  By: Lamonte Sakai MD, Rose Fillers   Overview:  Overview:  Qualifier: History of  By: Lamonte Sakai MD, Rose Fillers  Last Assessment & Plan:  There was no evidence of this on her cardiac monitor. I would recommend watchful waiting.   Marland Kitchen Hearing loss of right ear   . History of aortic valve replacement 10/27/2010   Overview:  Last Assessment & Plan:  Will get echo to reassess bioprosthetic aortic valve.   Marland Kitchen History of non-ST elevation myocardial infarction (NSTEMI)    Sept 2014--  thought to be type II HTN w/ LHC without infarct related artery and patent grafts  . History of radiation therapy 05/24/16-07/26/16   right breast 50.4 Gy in 28 fractions, right breast boost 10 Gy in 5 fractions  . Hyperlipidemia   . Hypertension    Resolved.  . Iron deficiency anemia   . Mania ([place]) 03/05/2016  . Moderate persistent asthma    pulmologist-  Dr. Malvin Johns  . Nonischemic cardiomyopathy ([place])   . Osteomyelitis of toe of left foot ([place]) 04/06/2016  . Osteopenia of multiple sites 10/19/2015  . PAF (paroxysmal atrial fibrillation) ([place])   . Personal history of chemotherapy 2017  . Personal history of radiation therapy 2017  . PONV (postoperative nausea and vomiting)   . Port catheter in place 02/17/2016  . Psoriasis    right leg  . Renal calculus, right   . Renal insufficiency, mild   . S/P AVR    prosthesis valve placement 2009 at same time re-do CABG  . Seizure ([place]) 06/29/2016  . Seizure-like activity ([place]) 09/01/2017  . Sensorineural hearing loss (SNHL) of both ears 01/06/2016  . 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  Syncopal episodes 05/21/2018  . Syncope 09/06/2017  . Type 2 diabetes mellitus ([place])    monitored by dr Dwyane Dee    Past Surgical History:  Procedure Laterality Date  . AORTIC VALVE REPLACEMENT  2009   #66m [employer] pericardial valve (done same time is CABG)  . BREAST LUMPECTOMY Right 01/18/2016  . BREAST LUMPECTOMY WITH RADIOACTIVE SEED AND SENTINEL LYMPH NODE BIOPSY Right 01/18/2016   Procedure: RIGHT BREAST LUMPECTOMY WITH RADIOACTIVE SEED AND SENTINEL LYMPH NODE BIOPSY;  Surgeon: DAlphonsa Overall MD;  [place] [facility]  Service: General;  Laterality: Right;  . CARDIAC CATHETERIZATION  03/23/2008   Pre-redo CABG: L main OK, LAD (T), CFX (T), OM1 99%, RCA (T), LIMA-LAD OK, SVG-OM(?3) OK w/ little florw to OM2, SVG-RCA OK. EF NL  . CARPAL TUNNEL RELEASE    . CHOLECYSTECTOMY N/A 07/07/2018   Procedure: LAPAROSCOPIC CHOLECYSTECTOMY WITH INTRAOPERATIVE CHOLANGIOGRAM ERAS PATHWAY;  Surgeon: NAlphonsa Overall MD;  Location: [facility];  Service: General;  Laterality: N/A;  . COLONOSCOPY     around 2015. Possibly with [facility]   .  CORONARY ARTERY BYPASS GRAFT  1998 &  re-do 2009   Had LIMA to DX/LAD, SVG to 2 marginal branches and SVG to [facility] originally; SVG to 3rd OM and PD at time of redo  . CYSTOSCOPY W/ URETERAL STENT PLACEMENT Right 12/20/2014   Procedure: CYSTOSCOPY WITH RETROGRADE PYELOGRAM/URETERAL STENT PLACEMENT;  Surgeon: Cleon Gustin, MD;  Location: [facility];  Service: Urology;  Laterality: Right;  . ESOPHAGOGASTRODUODENOSCOPY     many years ago per patient   . ESOPHAGOGASTRODUODENOSCOPY ENDOSCOPY  06/17/2018  . EYE SURGERY Bilateral    cataracts  . HOLMIUM LASER APPLICATION Right 9/70/2637   Procedure:  HOLMIUM LASER LITHOTRIPSY;  Surgeon: Cleon Gustin, MD;  Location[facility];  Service: Urology;  Laterality: Right;  . LEFT HEART CATHETERIZATION WITH CORONARY/GRAFT ANGIOGRAM N/A 02/23/2013   Procedure: LEFT HEART CATHETERIZATION WITH  Beatrix Fetters;  Surgeon: Blane Ohara, MD;  Location: [facility] CATH LAB;  Service: Cardiovascular;  Laterality: N/A;  . LOOP RECORDER INSERTION N/A 08/30/2017   Procedure: LOOP RECORDER INSERTION;  Surgeon: Evans Lance, MD;  Location: [place] CV LAB;  Service: Cardiovascular;  Laterality: N/A;  . PORTACATH PLACEMENT Left 01/18/2016   Procedure: INSERTION PORT-A-CATH;  Surgeon: Alphonsa Overall, MD;  Location: [facility];  Service: General;  Laterality: Left;  . portacath removal    . TONSILLECTOMY    . TRANSTHORACIC ECHOCARDIOGRAM  02-24-2013      mild LVH,  ef 50-55%/  AV bioprosthesis was present with very mild stenosis and no regurg., mean grandient 21mHg, peak grandient 284mg /  mild MR/  mild LAE and RAE/  moderate TR  . TUBAL LIGATION      Family History  Problem Relation Age of Onset  . Heart disease Father   . Heart failure Father   . Diabetes Maternal Grandmother   . Heart disease Maternal Grandmother   . Diabetes Son   . Healthy Brother        #1  . Heart attack Brother        #2  . Heart disease Brother        #2  . Colon cancer Neg Hx   . Esophageal cancer Neg Hx     Social History:  reports that she has never smoked. She has never used smokeless tobacco. She reports that she does not drink alcohol or use drugs.    Review of Systems    Lipid history: On 40 mg Crestor along with Zetia      Lab Results  Component Value Date   CHOL 110 05/12/2018   HDL 33.70 (L) 05/12/2018   LDLCALC 59 05/12/2018   LDLDIRECT 53.0 10/08/2016   TRIG 82.0 05/12/2018   CHOLHDL 3 05/12/2018           Eyes:  No history of retinopathy  She is followed by nephrologist for renal dysfunction which appears slightly better    Lab Results  Component Value Date   CREATININE 1.40 (H) 08/13/2018   CREATININE 1.40 (H) 08/13/2018   CREATININE 1.61 (H) 07/04/2018    Diabetic foot exam in 3/20: She has significant loss of monofilament sensation in her distal feet She thinks  her neuropathy started after her chemotherapy Also has some difficulty with balance  Asking about mild persistent redness of the right fifth toe without any history of trauma  BLOOD pressure: She monitors at home Bp at home 130/50s Cardiologist is treating with Florinef for history of syncope  Has hypokalemia,  followed by cardiologist  Lab Results  Component Value Date   K 3.2 (L) 08/13/2018     Physical Examination:  BP (!) 140/48 (Patient Position: Standing)   Pulse 62   Ht 5' 5"  (1.651 m)   Wt 173 lb (78.5 kg)   SpO2 96%   BMI 28.79 kg/m   Diabetic Foot Exam - Simple   Simple Foot Form Diabetic Foot exam was performed with the following findings:  Yes 08/15/2018 10:59 AM  Visual Inspection No deformities, no ulcerations, no other skin breakdown bilaterally:  Yes See comments:  Yes Sensation Testing See comments:  Yes Pulse Check Posterior Tibialis and Dorsalis pulse intact bilaterally:  Yes Comments Absent monofilament sensation in the first 2 toes distally and plantar surfaces, decreased on the other toes.  Minimal erythema of the right fifth toe laterally without tenderness, warmth or skin changes      ASSESSMENT:  Diabetes type 2, on insulin pump    See history of present illness for detailed discussion of  current management, blood sugar patterns and problems identified  Her A1c is excellent at 6.1  She is not able to get consistently controlled with tendency to hypoglycemia early morning and higher readings periodically after meals Because of fear of hypoglycemia she is taking less insulin to cover her meals even at lunchtime causing several readings over 200 in the afternoon Even with 20 unit basal her fasting readings are averaging only about 90 and periodically in the 60s She does have some limitations in her diet currently because of GI issues and recent gallbladder surgery  RENAL dysfunction: Slightly better, followed by nephrologist  Hypertension:  Blood pressure higher today but she thinks it is better at home and appears to be variable Followed by cardiology for reportedly having orthostatic hypotension treated with Florinef  Hypokalemia from diuretics and Florinef: Currently being supplemented by cardiologist  Neuropathy: This is a combination of diabetes and history of chemotherapy and discussed foot care  PLAN:     She can try and leave off her Trulicity this weekend and let us know how her blood sugars do over the next 2 weeks  If she has fairly good blood sugars in the mornings she can continue without this and try to bolus adequately for her meals and keep them consistently controlled  She will also look into the OmniPod pump which previously was expensive and discussed how this is different also  Balanced diet with some protein at each meal  Follow-up with cardiology for hypokalemia  If she is not able to get the pump and her blood sugars are not controlled may need to go back to Lantus and Humalog  Counseling time on subjects discussed in assessment and plan sections is over 50% of today's 25 minute visit   Patient Instructions  No Trulicity        Elayne Snare 08/15/2018, 11:02 AM   Note: This office note was prepared with Dragon voice recognition system technology. Any transcriptional errors that result from this process are unintentional.

## 2018-08-22 ENCOUNTER — Ambulatory Visit: Payer: Medicare Other | Admitting: Podiatry

## 2018-08-28 ENCOUNTER — Ambulatory Visit (INDEPENDENT_AMBULATORY_CARE_PROVIDER_SITE_OTHER): Payer: Medicare Other | Admitting: *Deleted

## 2018-08-28 DIAGNOSIS — R55 Syncope and collapse: Secondary | ICD-10-CM | POA: Diagnosis not present

## 2018-08-29 ENCOUNTER — Other Ambulatory Visit: Payer: Self-pay

## 2018-08-29 ENCOUNTER — Other Ambulatory Visit: Payer: Medicare Other

## 2018-08-29 LAB — CUP PACEART REMOTE DEVICE CHECK
Date Time Interrogation Session: 20200319194119
Implantable Pulse Generator Implant Date: 20190322

## 2018-09-01 ENCOUNTER — Telehealth: Payer: Self-pay | Admitting: Endocrinology

## 2018-09-01 ENCOUNTER — Other Ambulatory Visit: Payer: Self-pay

## 2018-09-01 DIAGNOSIS — E860 Dehydration: Secondary | ICD-10-CM

## 2018-09-01 MED ORDER — INSULIN LISPRO 100 UNIT/ML ~~LOC~~ SOLN: SUBCUTANEOUS | 3 refills | Status: DC

## 2018-09-01 MED ORDER — INSULIN GLARGINE 100 UNIT/ML ~~LOC~~ SOLN: SUBCUTANEOUS | 3 refills | Status: DC

## 2018-09-01 NOTE — Telephone Encounter (Signed)
She is not able to use the Omni pod insulin pump we will need to have her go to the injections with Lantus 16 units in the morning daily along with Humalog before meals, 4 to 6 units based on the meal size.  She can use the Humalog with the vial and syringe or she can use an insulin pen if she prefers.  The pen will require new prescription.  Will need to have her follow-up in a couple of days on the phone to report her blood sugars after the change

## 2018-09-01 NOTE — Telephone Encounter (Signed)
She is supposed to have stopped her Trulicity to see if her morning sugars would not be as low as 62.  Would like to see what her blood sugars are for the last 3 to 4 days if she has stopped Trulicity

## 2018-09-01 NOTE — Telephone Encounter (Signed)
Called pt and gave her MD message. Pt wrote this down and verbally read back and verbalized understanding. Rx for lantus sent to pharmacy of patient choice, and pt stated that she did not need humalog at this time.

## 2018-09-01 NOTE — Telephone Encounter (Signed)
Caller states that Dr Dwyane Dee wanted her to change from V-Go to either Novolog Pen or Omnipod. Per patient she can not afford to do that and would like to know how to proceed.  She would like to stay in place  Please review telephone note and call her at [phone] and advise how to proceed

## 2018-09-01 NOTE — Telephone Encounter (Signed)
Pt stated that she did stop Trulicity, and blood sugars have been around the same.  Blood sugars are as follows: 09/01/2018 @ 0636- 62mg /dL 08/31/2018 @ 0840- 82mg /dL 08/30/2018 @ 0826- 75mg /dL 08/29/2018 @ 0735-75mg /dL 08/28/2018 @ 0740-75mg /dL

## 2018-09-01 NOTE — Telephone Encounter (Signed)
Please advise 

## 2018-09-02 ENCOUNTER — Telehealth: Payer: Self-pay | Admitting: Endocrinology

## 2018-09-02 ENCOUNTER — Other Ambulatory Visit: Payer: Self-pay

## 2018-09-02 ENCOUNTER — Other Ambulatory Visit: Payer: Self-pay | Admitting: Medical

## 2018-09-02 MED ORDER — INSULIN PEN NEEDLE 32G X 4 MM MISC: 3 refills | Status: DC

## 2018-09-02 NOTE — Telephone Encounter (Signed)
Refill Request: Zolpidem  Last RX: 07/08/18 Last OV:07/30/18 Next OV: None scheduled  UDS:11/20/17 CSC:11/20/17 CSR:

## 2018-09-02 NOTE — Telephone Encounter (Signed)
Rx sent 

## 2018-09-02 NOTE — Telephone Encounter (Signed)
Rx ambien sent to pt pharmacy.

## 2018-09-02 NOTE — Telephone Encounter (Signed)
Patient wanted to leave a message to Olen Cordial and states she called her pharmacy about the needles and she does need an RX and she requests the smallest needles possible.  Please Advise, Thanks

## 2018-09-04 ENCOUNTER — Other Ambulatory Visit: Payer: Self-pay

## 2018-09-04 MED ORDER — INSULIN LISPRO 100 UNIT/ML ~~LOC~~ SOLN: SUBCUTANEOUS | 3 refills | Status: DC

## 2018-09-05 NOTE — Progress Notes (Signed)
[facility] Summary Report / Loop Recorder 

## 2018-09-08 ENCOUNTER — Other Ambulatory Visit: Payer: Self-pay | Admitting: Nurse Practitioner

## 2018-09-11 ENCOUNTER — Ambulatory Visit: Payer: Medicare Other | Admitting: Emergency Medicine

## 2018-09-15 ENCOUNTER — Other Ambulatory Visit: Payer: Self-pay

## 2018-09-15 ENCOUNTER — Encounter: Payer: Self-pay | Admitting: Adult Health

## 2018-09-15 ENCOUNTER — Ambulatory Visit (INDEPENDENT_AMBULATORY_CARE_PROVIDER_SITE_OTHER): Payer: Medicare Other | Admitting: Adult Health

## 2018-09-15 DIAGNOSIS — J4531 Mild persistent asthma with (acute) exacerbation: Secondary | ICD-10-CM | POA: Diagnosis not present

## 2018-09-15 MED ORDER — CEFDINIR 300 MG PO CAPS: 300.0000 mg | ORAL_CAPSULE | Freq: Two times a day (BID) | ORAL | 0 refills | Status: DC

## 2018-09-15 NOTE — Progress Notes (Signed)
Virtual Visit via Telephone Note  I connected with Hannah [name] on 09/15/18 at  1:30 PM EDT by telephone and verified that I am speaking with the correct person using two identifiers.   I discussed the limitations, risks, security and privacy concerns of performing an evaluation and management service by telephone and the availability of in person appointments. I also discussed with the patient that there may be a patient responsible charge related to this service. The patient expressed understanding and agreed to proceed.   History of Present Illness: Today's Televisit is for an acute office visit.  73 year old female never smoker followed for Asthma.  She has A Fib on Amiodarone .  She complains of 1 week ago . Started initially with wet cough with clear to white mucus . Has worsened over last week with thick yellow mucus. Cough wears her out. Feels she get short of breath and cant get deep breath in.  Has started using her albuterol neb Twice daily  For last few days. Has been staying inside , trying not to get out in pollen. No watery eyes or nasal drainage.  No travel . No fever. Lives at home with husband and granddaughter (age 60) all at home . Not working outside the home and no travel . They are not sick.  Says her and family are staying home and social distancing.  Has good appetite. No n/v/d.   Had cholecystectomy end of January , did well with surgery.     Observations/Objective: Had CT chest in Dec 2019 , showed no PE , clear lungs .   Assessment and Plan: Acute Asthmatic Bronchitis - URI trigger . Low suspicion for COVID 19 with lack of fever, and close family members without symptoms . However discussed COVID red flags to monitor for and social distance/isolate at home   Plan  Patient Instructions  Omnicef 300mg  Twice daily  For 7 days , take with food.  Mucinex DM Twice daily As needed  Cough  Claritin 10mg  daily As needed drainage.  Fluids and rest .  Continue on  Symbicort 2 puffs Twice daily  , rinse after use.  If not improving , develop fever, increased shortness of breath or worsening symptoms call our office for sooner follow up or go to Emergency Room  Follow up with Dr. Lamonte Sakai  In 2 months and As needed   Please contact office for sooner follow up if symptoms do not improve or worsen or seek emergency care      Follow Up Instructions: Follow up with Dr. Lamonte Sakai  In 2 months and As needed   Please contact office for sooner follow up if symptoms do not improve or worsen or seek emergency care     I discussed the assessment and treatment plan with the patient. The patient was provided an opportunity to ask questions and all were answered. The patient agreed with the plan and demonstrated an understanding of the instructions.   The patient was advised to call back or seek an in-person evaluation if the symptoms worsen or if the condition fails to improve as anticipated.  I provided 22 minutes of non-face-to-face time during this encounter.   Rexene Edison, NP

## 2018-09-15 NOTE — Patient Instructions (Signed)
Omnicef 300mg  Twice daily  For 7 days , take with food.  Mucinex DM Twice daily As needed  Cough  Claritin 10mg  daily As needed drainage.  Fluids and rest .  Continue on Symbicort 2 puffs Twice daily  , rinse after use.  If not improving , develop fever, increased shortness of breath or worsening symptoms call our office for sooner follow up or go to Emergency Room  Follow up with Dr. Lamonte Sakai  In 2 months and As needed   Please contact office for sooner follow up if symptoms do not improve or worsen or seek emergency care

## 2018-09-15 NOTE — Telephone Encounter (Signed)
Called and made a televisit for patient. Nothing further needed at this time.

## 2018-09-17 ENCOUNTER — Telehealth: Payer: Self-pay | Admitting: Nurse Practitioner

## 2018-09-17 ENCOUNTER — Other Ambulatory Visit: Payer: Self-pay | Admitting: Hematology and Oncology

## 2018-09-17 ENCOUNTER — Other Ambulatory Visit: Payer: Self-pay | Admitting: Nurse Practitioner

## 2018-09-17 ENCOUNTER — Other Ambulatory Visit: Payer: Self-pay | Admitting: Emergency Medicine

## 2018-09-17 DIAGNOSIS — E860 Dehydration: Secondary | ICD-10-CM

## 2018-09-17 NOTE — Telephone Encounter (Signed)
Pt has been having increased instances of Afib.   When she was having the increased episodes, she was taking metoprolol  25mg  as needed. She has had to take the metoprolol at least 2x a day.  She just feels miserable when she has to take the metoprolol.  She has SOB when the episodes are happening. The SOB resides after taking metoprolol, but the SOB can last for around an hour She also has an increased BP when these attacks happen  Last night, she had a spell about 2am that woke her up. Her BP was 198/76, This morning it was back in her normal range 145/46  October 1 her dosage of Amlodipene was reduced by Dr. Lovena Le. In March, she was told she may have to increase her dosage of Amlodipine.    She wants to know if it is time to increase the amlodipine to reduce afib attacks,  Or if maybe another medication can be used to try and control the afib

## 2018-09-18 MED ORDER — BUDESONIDE-FORMOTEROL FUMARATE 160-4.5 MCG/ACT IN AERO: INHALATION_SPRAY | RESPIRATORY_TRACT | 1 refills | Status: DC

## 2018-09-19 ENCOUNTER — Telehealth: Payer: Self-pay | Admitting: Internal Medicine

## 2018-09-19 ENCOUNTER — Telehealth (INDEPENDENT_AMBULATORY_CARE_PROVIDER_SITE_OTHER): Payer: Medicare Other | Admitting: Internal Medicine

## 2018-09-19 DIAGNOSIS — I480 Paroxysmal atrial fibrillation: Secondary | ICD-10-CM

## 2018-09-19 DIAGNOSIS — R55 Syncope and collapse: Secondary | ICD-10-CM | POA: Diagnosis not present

## 2018-09-19 DIAGNOSIS — R0789 Other chest pain: Secondary | ICD-10-CM

## 2018-09-19 DIAGNOSIS — Z951 Presence of aortocoronary bypass graft: Secondary | ICD-10-CM | POA: Diagnosis not present

## 2018-09-19 MED ORDER — METOPROLOL TARTRATE 25 MG PO TABS: 25.0000 mg | ORAL_TABLET | Freq: Two times a day (BID) | ORAL | 3 refills | Status: DC

## 2018-09-19 MED ORDER — NITROGLYCERIN 0.4 MG SL SUBL: 0.4000 mg | SUBLINGUAL_TABLET | SUBLINGUAL | 3 refills | Status: DC | PRN

## 2018-09-19 MED ORDER — FLUDROCORTISONE ACETATE 0.1 MG PO TABS: ORAL_TABLET | ORAL | 3 refills | Status: DC

## 2018-09-19 NOTE — Patient Instructions (Signed)
Medication Instructions:  Your physician recommends that you continue on your current medications as directed. Please refer to the Current Medication list given to you today.  Change metoprolol tartrate 25 mg one tablet by mouth twice a day  Hold florinef  Start nitro tabs as needed for chest pressure  Labwork: None ordered.  Testing/Procedures: None ordered.  Follow-Up: Your physician wants you to follow-up in:    You will receive a reminder letter in the mail two months in advance. If you don't receive a letter, please call our office to schedule the follow-up appointment.   Any Other Special Instructions Will Be Listed Below (If Applicable).  If you need a refill on your cardiac medications before your next appointment, please call your pharmacy.

## 2018-09-19 NOTE — Telephone Encounter (Signed)
Virtual visit scheduled for today.

## 2018-09-19 NOTE — Telephone Encounter (Signed)
New Message   Pt c/o of Chest Pain: STAT if CP now or developed within 24 hours  1. Are you having CP right now? Chest Tightness, yes feels heavy  2. Are you experiencing any other symptoms (ex. SOB, nausea, vomiting, sweating)? SOB when she gets up and moves around   3. How long have you been experiencing CP? Started about a week ago, was coughing real bad, Dr put her on antibiotic. She's not on antibiotic anymore but she feels a tightness in her chest   4. Is your CP continuous or coming and going? Continuous   5. Have you taken Nitroglycerin? No  ?

## 2018-09-19 NOTE — Telephone Encounter (Signed)
Dr. Lovena Le will see patient for Video visit this afternoon.      Virtual Visit Pre-Appointment Phone Call  Steps For Call:  1. Confirm consent - "In the setting of the current Covid19 crisis, you are scheduled for a VIDEO visit with your provider on 4/10 at 130pm.  Just as we do with many in-office visits, in order for you to participate in this visit, we must obtain consent.  If you'd like, I can send this to your mychart (if signed up) or email for you to review.  Otherwise, I can obtain your verbal consent now.  All virtual visits are billed to your insurance company just like a normal visit would be.  By agreeing to a virtual visit, we'd like you to understand that the technology does not allow for your provider to perform an examination, and thus may limit your provider's ability to fully assess your condition.  Finally, though the technology is pretty good, we cannot assure that it will always work on either your or our end, and in the setting of a video visit, we may have to convert it to a phone-only visit.  In either situation, we cannot ensure that we have a secure connection.  Are you willing to proceed?"  2. Give patient instructions for WebEx download to smartphone as below if video visit  3. Advise patient to be prepared with any vital sign or heart rhythm information, their current medicines, and a piece of paper and pen handy for any instructions they may receive the day of their visit  4. Inform patient they will receive a phone call 15 minutes prior to their appointment time (may be from unknown caller ID) so they should be prepared to answer  5. Confirm that appointment type is correct in Epic appointment notes (video vs telephone)    TELEPHONE CALL NOTE  Shannelle Alguire Lyford has been deemed a candidate for a follow-up tele-health visit to limit community exposure during the Covid-19 pandemic. I spoke with the patient via phone to ensure availability of phone/video source,  confirm preferred email & phone number, and discuss instructions and expectations.  I reminded Kaylee Trivett Lubrano to be prepared with any vital sign and/or heart rhythm information that could potentially be obtained via home monitoring, at the time of her visit. I reminded Dannae Kato Madrigal to expect a phone call at the time of her visit if her visit.  Did the patient verbally acknowledge consent to treatment? YES  Wilma Flavin, RN 09/19/2018 12:09 PM

## 2018-09-19 NOTE — Progress Notes (Signed)
Electrophysiology TeleHealth Note   Due to national recommendations of social distancing due to COVID 19, an audio/video telehealth visit is felt to be most appropriate for this patient at this time.  See MyChart message from today for the patient's consent to telehealth for [facility].   Date:  09/19/2018   ID:  Hannah [name], DOB 07-Apr-1946, MRN [id number]  Location: patient's home  Provider location: [address] [place]  Evaluation Performed: Follow-up visit  PCP:  Mackie Pai, PA-C  Cardiologist:  No primary care provider on file. none Electrophysiologist:  Dr Lovena Le  Chief Complaint:  Chest pain  History of Present Illness:    Hannah [name] is a 73 y.o. female who presents via audio/video conferencing for a telehealth visit today. The patient is a 73 yo woman with CAD, AVR, HTN and PAF. She has been on amiodarone.  I saw her 3 months ago and she was doing well. Since last being seen in our clinic, the patient reports having undergone removal of her gallstones. She has been bothered by chest pressure associated with an elevation of her BP.  She has had palpitations, and some shortness of breath, but no lower extremity edema, dizziness, presyncope, or syncope.  The patient is otherwise without complaint today.  The patient denies symptoms of fevers, chills, cough, or new SOB worrisome for COVID 19.  Past Medical History:  Diagnosis Date  . Abnormally small mouth   . Acute on chronic combined systolic and diastolic congestive heart failure ([place]) 07/17/2013  . Adrenal insufficiency ([place]) 07/03/2016  . AKI (acute kidney injury) ([place]) 09/01/2017  . Allergic rhinitis 10/14/2009   Qualifier: Diagnosis of  By: Lamonte Sakai MD, Rose Fillers   Overview:  Overview:  Qualifier: Diagnosis of  By: Lamonte Sakai MD, Rose Fillers  Last Assessment & Plan:  Please continue Xyzal and Nasacort as you have been using them  . Anemia   . Asthma 05/12/2009   10/12/2014 p extensive coaching HFA  effectiveness =    75% s spacer    Overview:  Overview:  10/12/2014 p extensive coaching HFA effectiveness =    75% s spacer   Last Assessment & Plan:  Please continue Symbicort 2 puffs twice a day. Remember to rinse and gargle after taking this medication. Take albuterol 2 puffs up to every 4 hours if needed for shortness of breath.  Follow with Dr Lamonte Sakai in 6 month  . Atrial tachycardia ([place]) 04/03/2013   Overview:  Last Assessment & Plan:  I have discussed the likely benign nature of her problem. She has very minimal palpitations. I doubt atrial flutter. I would not anti-coagulate. Her monitor demonstrates that her episodes are short lived. Will follow.   . Bilateral carotid artery stenosis    Bilateral ICA 40-59%/  >50% LECA  . Breast cancer ([place]) 01/18/2016   right breast  . Breast cancer of upper-inner quadrant of right female breast ([place]) 12/16/2015  . CAD (coronary artery disease) cardiologist-  dr Aundra Dubin   Remote CABG in 1998 with redo in 2009  . Chemotherapy induced nausea and vomiting 02/24/2016  . 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 CHF (congestive heart failure) (HCC)    EF is low normal at 50 to 55% per echo in Jan. 2011  . Chronic coronary artery disease 01/24/2011   Overview:  Last Assessment & Plan:  Stable with no ischemic symptoms.  Continue ASA, statin, ACEI, beta blocker.   Marland Kitchen  Chronic diastolic CHF (congestive heart failure) ([place]) 09/24/2013  . CKD (chronic kidney disease), stage IV ([place]) 09/24/2013  . Closed fracture of head of left humerus 09/09/2017  . Complication of anesthesia   . Essential hypertension 05/12/2009   Qualifier: Diagnosis of  By: Lamonte Sakai MD, Rose Fillers   . Gallstones   . GERD (gastroesophageal reflux disease)   . Glaucoma   . H/O atrial tachycardia 05/12/2009   Qualifier: History of  By: Lamonte Sakai MD, Rose Fillers   Overview:  Overview:  Qualifier: History of  By: Lamonte Sakai MD, Rose Fillers  Last  Assessment & Plan:  There was no evidence of this on her cardiac monitor. I would recommend watchful waiting.   Marland Kitchen Hearing loss of right ear   . History of aortic valve replacement 10/27/2010   Overview:  Last Assessment & Plan:  Will get echo to reassess bioprosthetic aortic valve.   Marland Kitchen History of non-ST elevation myocardial infarction (NSTEMI)    Sept 2014--  thought to be type II HTN w/ LHC without infarct related artery and patent grafts  . History of radiation therapy 05/24/16-07/26/16   right breast 50.4 Gy in 28 fractions, right breast boost 10 Gy in 5 fractions  . Hyperlipidemia   . Hypertension    Resolved.  . Iron deficiency anemia   . Mania ([place]) 03/05/2016  . Moderate persistent asthma    pulmologist-  Dr. Malvin Johns  . Nonischemic cardiomyopathy ([place])   . Osteomyelitis of toe of left foot ([place]) 04/06/2016  . Osteopenia of multiple sites 10/19/2015  . PAF (paroxysmal atrial fibrillation) ([place])   . Personal history of chemotherapy 2017  . Personal history of radiation therapy 2017  . PONV (postoperative nausea and vomiting)   . Port catheter in place 02/17/2016  . Psoriasis    right leg  . Renal calculus, right   . Renal insufficiency, mild   . S/P AVR    prosthesis valve placement 2009 at same time re-do CABG  . Seizure ([place]) 06/29/2016  . Seizure-like activity ([place]) 09/01/2017  . Sensorineural hearing loss (SNHL) of both ears 01/06/2016  . Stroke [facility])    residual rt hearing loss  . Syncopal episodes 05/21/2018  . Syncope 09/06/2017  . Type 2 diabetes mellitus ([place])    monitored by dr Dwyane Dee    Past Surgical History:  Procedure Laterality Date  . AORTIC VALVE REPLACEMENT  2009   #42mm [facility] Ease pericardial valve (done same time is CABG)  . BREAST LUMPECTOMY Right 01/18/2016  . BREAST LUMPECTOMY WITH RADIOACTIVE SEED AND SENTINEL LYMPH NODE BIOPSY Right 01/18/2016   Procedure: RIGHT BREAST LUMPECTOMY WITH RADIOACTIVE SEED AND SENTINEL LYMPH NODE BIOPSY;  Surgeon: Alphonsa Overall, MD;  Location: [facility];  Service: General;  Laterality: Right;  . CARDIAC CATHETERIZATION  03/23/2008   Pre-redo CABG: L main OK, LAD (T), CFX (T), OM1 99%, RCA (T), LIMA-LAD OK, SVG-OM(?3) OK w/ little florw to OM2, SVG-RCA OK. EF NL  . CARPAL TUNNEL RELEASE    . CHOLECYSTECTOMY N/A 07/07/2018   Procedure: LAPAROSCOPIC CHOLECYSTECTOMY WITH INTRAOPERATIVE CHOLANGIOGRAM ERAS PATHWAY;  Surgeon: Alphonsa Overall, MD;  Location: [facility];  Service: General;  Laterality: N/A;  . COLONOSCOPY     around 2015. Possibly with [facility]   . CORONARY ARTERY BYPASS GRAFT  1998 &  re-do 2009   Had LIMA to DX/LAD, SVG to 2 marginal branches and SVG to [facility] originally; SVG to 3rd OM and PD at time of redo  . CYSTOSCOPY  W/ URETERAL STENT PLACEMENT Right 12/20/2014   Procedure: CYSTOSCOPY WITH RETROGRADE PYELOGRAM/URETERAL STENT PLACEMENT;  Surgeon: Cleon Gustin, MD;  Location: [facility];  Service: Urology;  Laterality: Right;  . ESOPHAGOGASTRODUODENOSCOPY     many years ago per patient   . ESOPHAGOGASTRODUODENOSCOPY ENDOSCOPY  06/17/2018  . EYE SURGERY Bilateral    cataracts  . HOLMIUM LASER APPLICATION Right 10/07/7679   Procedure:  HOLMIUM LASER LITHOTRIPSY;  Surgeon: Cleon Gustin, MD;  Location[facility];  Service: Urology;  Laterality: Right;  . LEFT HEART CATHETERIZATION WITH CORONARY/GRAFT ANGIOGRAM N/A 02/23/2013   Procedure: LEFT HEART CATHETERIZATION WITH Beatrix Fetters;  Surgeon: Blane Ohara, MD;  Location: [facility] CATH LAB;  Service: Cardiovascular;  Laterality: N/A;  . LOOP RECORDER INSERTION N/A 08/30/2017   Procedure: LOOP RECORDER INSERTION;  Surgeon: Evans Lance, MD;  Location: [facility] CV LAB;  Service: Cardiovascular;  Laterality: N/A;  . PORTACATH PLACEMENT Left 01/18/2016   Procedure: INSERTION PORT-A-CATH;  Surgeon: Alphonsa Overall, MD;  Location: [place] [facility];  Service: General;  Laterality: Left;  . portacath removal    . TONSILLECTOMY    .  TRANSTHORACIC ECHOCARDIOGRAM  02-24-2013      mild LVH,  ef 50-55%/  AV bioprosthesis was present with very mild stenosis and no regurg., mean grandient 81mmHg, peak grandient 76mmHg /  mild MR/  mild LAE and RAE/  moderate TR  . TUBAL LIGATION      Current Outpatient Medications  Medication Sig Dispense Refill  . acetaminophen (TYLENOL) 325 MG tablet Take 2 tablets (650 mg total) by mouth every 6 (six) hours as needed for mild pain (or Fever >/= 101).    Marland Kitchen albuterol (PROVENTIL HFA;VENTOLIN HFA) 108 (90 Base) MCG/ACT inhaler Inhale 2 puffs into the lungs every 6 (six) hours as needed for wheezing or shortness of breath.    Marland Kitchen amiodarone (PACERONE) 200 MG tablet Take 200 mg by mouth daily.    . budesonide-formoterol (SYMBICORT) 160-4.5 MCG/ACT inhaler INHALE TWO PUFFS INTO THE LUNGS TWICE DAILY 3 Inhaler 1  . cefdinir (OMNICEF) 300 MG capsule Take 1 capsule (300 mg total) by mouth 2 (two) times daily. 14 capsule 0  . ELIQUIS 5 MG TABS tablet TAKE ONE TABLET BY MOUTH TWICE DAILY 60 tablet 6  . exemestane (AROMASIN) 25 MG tablet TAKE ONE TABLET BY MOUTH DAILY AFTER BREAKFAST 90 tablet 3  . ezetimibe (ZETIA) 10 MG tablet Take 1 tablet (10 mg total) by mouth daily. 90 tablet 3  . fludrocortisone (FLORINEF) 0.1 MG tablet Take 1 tablet (0.1 mg total) by mouth daily. 90 tablet 3  . furosemide (LASIX) 20 MG tablet Take 1 tablet (20 mg total) by mouth daily as needed for fluid. 30 tablet 0  . glucose blood (ACCU-CHEK GUIDE) test strip Use to test blood sugar 3 times daily 100 each 3  . insulin glargine (LANTUS) 100 UNIT/ML injection Inject 16 units under the skin once daily in the morning. 20 mL 3  . insulin lispro (HUMALOG) 100 UNIT/ML injection Inject 4-6 units under the skin three times daily before meals. 20 mL 3  . Insulin Pen Needle 32G X 4 MM MISC Use as instructed to inject insulin 4 times daily. 400 each 3  . loratadine (CLARITIN) 10 MG tablet Take 10 mg by mouth daily as needed for allergies or  rhinitis.     . metoprolol tartrate (LOPRESSOR) 25 MG tablet May take a total of 4 tablets in a 24 hour  period. 30 tablet 3  . pantoprazole (PROTONIX) 40 MG tablet Take 1 tablet (40 mg total) by mouth daily. 30 tablet 11  . rosuvastatin (CRESTOR) 40 MG tablet Take 1 tablet (40 mg total) by mouth at bedtime. 90 tablet 3  . zolpidem (AMBIEN) 5 MG tablet TAKE ONE TABLET BY MOUTH AT BEDTIME AS NEEDED FOR SLEEP 30 tablet 2   No current facility-administered medications for this visit.     Allergies:   Amoxicillin; Imdur [isosorbide dinitrate]; Tape; Arimidex [anastrozole]; Latex; and Tetracycline   Social History:  The patient  reports that she has never smoked. She has never used smokeless tobacco. She reports that she does not drink alcohol or use drugs.   Family History:  The patient's  family history includes Diabetes in her maternal grandmother and son; Healthy in her brother; Heart attack in her brother; Heart disease in her brother, father, and maternal grandmother; Heart failure in her father.   ROS:  Please see the history of present illness.   All other systems are personally reviewed and negative.    Exam:    Vital Signs:  There were no vitals taken for this visit.  Well appearing, alert and conversant, regular work of breathing,  good skin color Eyes- anicteric, neuro- grossly intact, skin- no apparent rash or lesions or cyanosis, mouth- oral mucosa is pink   Labs/Other Tests and Data Reviewed:    Recent Labs: 02/04/2018: B Natriuretic Peptide 222.2 05/31/2018: Magnesium 1.9 08/13/2018: ALT 38; BUN 21; Creatinine, Ser 1.40; Hemoglobin 10.5; Platelets 133; Potassium 3.2; Sodium 141; TSH 2.320   Wt Readings from Last 3 Encounters:  08/15/18 173 lb (78.5 kg)  08/13/18 175 lb 12.8 oz (79.7 kg)  07/30/18 177 lb 3.2 oz (80.4 kg)        ASSESSMENT & PLAN:    1.  Chest pressure - she is s/p CABG twice. She has had some fairly typical anginal symptoms. I have asked her to take  metoprolol 25 bid and take up to 2 additional metoprolol a day if she has chest pain or elevated blood pressure.She has been given a script for slntg. 2. PAF - she is still taking her amiodarone. By her history, it sounds like she might be having some breakthrough episodes. She will take her amiodarone. 3. HTN - her pressures have been high. I asked that she hold the florinef. Hopefully these will improve with daily metoprolol. 4. COVID 19 screen The patient denies symptoms of COVID 19 at this time.  The importance of social distancing was discussed today.  Follow-up:  3 months with Nelda Marseille, NP Next remote: 3 months  Current medicines are reviewed at length with the patient today.   The patient does not have concerns regarding her medicines.  The following changes were made today:  none  Labs/ tests ordered today include:  No orders of the defined types were placed in this encounter.    Patient Risk:  after full review of this patients clinical status, I feel that they are at moderate risk at this time.  Today, I have spent 25 minutes with the patient with telehealth technology discussing all of the above. Loma Messing, MD  09/19/2018 12:50 PM     [address] [place] [address] [phone] (office) [phone] (fax)

## 2018-09-19 NOTE — Telephone Encounter (Signed)
Calling pt back about her chest pain.  Pt reports feeling discomfort described as "tight and heavy", SOB on rest and worsens with exertion. PT reports having this pain all last night and today.  Rates pain as 3/10 at this time that is continuous with no relief.   Denies nausea, cough, fever or radiating pain.  Pt had bronchitis diagnosed last week and finished ABX course this week.   Feet and ankles are swollen, pt reports taking her as needed 20 mg Lasix this morning.   Will route to Dr. Lovena Le for advisement.

## 2018-09-24 NOTE — Telephone Encounter (Signed)
Called and spoke w/ pt regarding MW's recommendations. Pt verbalized understanding and agreed to setting up an in-office appt for tomorrow 09/25/2018 at 0945. Verified new address w/ pt. Requested she bring in all meds per MW. Will route to MW as an Pharmacist, hospital. Nothing further needed at this time.

## 2018-09-24 NOTE — Telephone Encounter (Signed)
Primary Pulmonologist: Dr. Baltazar Apo Last office visit and with whom: 09/15/2018 w/ TP What do we see them for (pulmonary problems): Mild persistent asthmatic bronchitis  Reason for call: Called and spoke w/ pt. Had cough w/ mucus 3w ago, LBPULM gave her an antibiotic and she states once she finished the antibiotic, she started having pains "in side of chest area." Had E-visit with Dr. Lovena Le (Cardiologist) and pt states MD stated she had angina.  As far as her breathing, pt states she "feels like she breathes shallow" since about 3 weeks ago. States she uses Symbicort and nebulizer, but states they do not give her total relief. Denies fever/chills/sweats.   Pt is requesting to be seen in-office to have someone listen to her lungs. I informed her we are currently trying to keep pts safe by keeping them out of office and limiting exposure, however, since she is NEG for COVID-19 screen, there is a possibility she could be seen by MW.      In the last month, have you been in contact with someone who was confirmed or suspected to have [place]avirus / COVID-19?  No  Do you have any of the following symptoms developed in the last 30 days? Fever: No Cough: Yes Shortness of breath: Yes  When did your symptoms start?  3 weeks ago  If the patient has a fever, what is the last reading?  (use n/a if patient denies fever)  N/A . IF THE PATIENT STATES THEY DO NOT OWN A THERMOMETER, THEY MUST GO AND PURCHASE ONE When did the fever start?: N/A Have you taken any medication to suppress a fever (ie Ibuprofen, Aleve, Tylenol)?: N/A  MW, please advise. Pt would like to be seen in-office to have someone listen to her lungs. Let us know if you'd be ok with setting up an in-office visit w/ this patient.

## 2018-09-24 NOTE — Telephone Encounter (Signed)
Happy to see but needs to bring all meds including otcs

## 2018-09-25 ENCOUNTER — Ambulatory Visit (INDEPENDENT_AMBULATORY_CARE_PROVIDER_SITE_OTHER): Payer: Medicare Other

## 2018-09-25 ENCOUNTER — Other Ambulatory Visit: Payer: Self-pay

## 2018-09-25 ENCOUNTER — Ambulatory Visit (INDEPENDENT_AMBULATORY_CARE_PROVIDER_SITE_OTHER): Payer: Medicare Other | Admitting: Internal Medicine

## 2018-09-25 ENCOUNTER — Encounter: Payer: Self-pay | Admitting: Internal Medicine

## 2018-09-25 VITALS — BP 118/64 | HR 63 | Temp 97.9°F | Ht 65.0 in | Wt 181.2 lb

## 2018-09-25 DIAGNOSIS — R0609 Other forms of dyspnea: Secondary | ICD-10-CM | POA: Diagnosis not present

## 2018-09-25 DIAGNOSIS — D649 Anemia, unspecified: Secondary | ICD-10-CM

## 2018-09-25 DIAGNOSIS — D696 Thrombocytopenia, unspecified: Secondary | ICD-10-CM

## 2018-09-25 DIAGNOSIS — R0600 Dyspnea, unspecified: Secondary | ICD-10-CM

## 2018-09-25 DIAGNOSIS — J90 Pleural effusion, not elsewhere classified: Secondary | ICD-10-CM

## 2018-09-25 DIAGNOSIS — J4530 Mild persistent asthma, uncomplicated: Secondary | ICD-10-CM

## 2018-09-25 LAB — BASIC METABOLIC PANEL
BUN: 26 mg/dL — ABNORMAL HIGH (ref 6–23)
CO2: 30 mEq/L (ref 19–32)
Calcium: 8.9 mg/dL (ref 8.4–10.5)
Chloride: 104 mEq/L (ref 96–112)
Creatinine, Ser: 1.57 mg/dL — ABNORMAL HIGH (ref 0.40–1.20)
GFR: 32.29 mL/min — ABNORMAL LOW (ref >60.00)
Glucose, Bld: 167 mg/dL — ABNORMAL HIGH (ref 70–99)
Potassium: 3.6 mEq/L (ref 3.5–5.1)
Sodium: 140 mEq/L (ref 135–145)

## 2018-09-25 LAB — BRAIN NATRIURETIC PEPTIDE: Pro B Natriuretic peptide (BNP): 406 pg/mL — ABNORMAL HIGH (ref 0.0–100.0)

## 2018-09-25 LAB — CBC WITH DIFFERENTIAL/PLATELET
Basophils Absolute: 0.1 10*3/uL (ref 0.0–0.1)
Basophils Relative: 1 % (ref 0.0–3.0)
Eosinophils Absolute: 0.3 10*3/uL (ref 0.0–0.7)
Eosinophils Relative: 3.6 % (ref 0.0–5.0)
HCT: 31.1 % — ABNORMAL LOW (ref 36.0–46.0)
Hemoglobin: 10.4 g/dL — ABNORMAL LOW (ref 12.0–15.0)
Lymphocytes Relative: 7.7 % — ABNORMAL LOW (ref 12.0–46.0)
Lymphs Abs: 0.5 10*3/uL — ABNORMAL LOW (ref 0.7–4.0)
MCHC: 33.5 g/dL (ref 30.0–36.0)
MCV: 99 fl (ref 78.0–100.0)
Monocytes Absolute: 0.8 10*3/uL (ref 0.1–1.0)
Monocytes Relative: 11.2 % (ref 3.0–12.0)
Neutro Abs: 5.4 10*3/uL (ref 1.4–7.7)
Neutrophils Relative %: 76.5 % (ref 43.0–77.0)
Platelets: 109 10*3/uL — ABNORMAL LOW (ref 150.0–400.0)
RBC: 3.14 Mil/uL — ABNORMAL LOW (ref 3.87–5.11)
RDW: 13.6 % (ref 11.5–15.5)
WBC: 7 10*3/uL (ref 4.0–10.5)

## 2018-09-25 LAB — SEDIMENTATION RATE: Sed Rate: 23 mm/hr (ref 0–30)

## 2018-09-25 LAB — TSH: TSH: 3.31 u[IU]/mL (ref 0.35–4.50)

## 2018-09-25 MED ORDER — FAMOTIDINE 20 MG PO TABS: ORAL_TABLET | ORAL | 11 refills | Status: DC

## 2018-09-25 NOTE — Assessment & Plan Note (Addendum)
S/p GB surgery  Jul 07 2018  New dx 09/25/2018 assoc with intermediate bnp, mild peripheral edema   ddx =  Chf, parapneumonic, or late complication of GB surgery with sympathetic effusion or malignancy/  recurrent breast ca - unlikely occult PE as she's on DOAC   She had no effusion preop and and I'm concerned that just empirically diuresing or adding aldactone will cause worse renal insuff so rec hold doac x 24 h and do dx/ therapeutic R thoracentesis.    Discussed in detail all the  indications, usual  risks and alternatives  relative to the benefits with patient who agrees to proceed with w/u if not relief in symptoms p trial of lasix x 3 days and will call back 10/01/18 to arrange approp f/u

## 2018-09-25 NOTE — Assessment & Plan Note (Addendum)
New onset late march 2020 p ? [place] with new  R effusion on cxr 09/25/2018  - 09/25/2018   Walked RA  2 laps @  approx 283ft each @ avg/moderate pace  stopped due to  End of study, min sob and chest tightness   - 09/25/2018  After extensive coaching inhaler device,  effectiveness =    75%   New effusion is the likely mech for sob/ orthopnea (see separate a/p)   She is also anemic and thrombocytopenic ? Why  -  (see separate a/p)

## 2018-09-25 NOTE — Patient Instructions (Addendum)
Symbicort 160 should be Take 2 puffs first thing in am and then another 2 puffs about 12 hours later.    Work on inhaler technique:  relax and gently blow all the way out then take a nice smooth deep breath back in, triggering the inhaler at same time you start breathing in.  Hold for up to 5 seconds if you can. Blow out thru nose. Rinse and gargle with water when done  Take the lasix 20 mg daily x 3 days then only as needed for swelling   Continue Pantoprazole (protonix) 40 mg   Take  30-60 min before first meal of the day and Pepcid (famotidine)  20 mg one after supper until return to office - this is the best way to tell whether stomach acid is contributing to your problem.    GERD (REFLUX)  is an extremely common cause of respiratory symptoms just like yours , many times with no obvious heartburn at all.    It can be treated with medication, but also with lifestyle changes including elevation of the head of your bed (ideally with 6 -8inch blocks under the headboard of your bed),  Smoking cessation, avoidance of late meals, excessive alcohol, and avoid fatty foods, chocolate, peppermint, colas, red wine, and acidic juices such as orange juice.  NO MINT OR MENTHOL PRODUCTS SO NO COUGH DROPS  USE SUGARLESS CANDY INSTEAD (Jolley ranchers or Stover's or Life Savers) or even ice chips will also do - the key is to swallow to prevent all throat clearing. NO OIL BASED VITAMINS - use powdered substitutes.  Avoid fish oil when coughing.     Please remember to go to the lab and x-ray department   for your tests - we will call you with the results when they are available.

## 2018-09-25 NOTE — Progress Notes (Signed)
Subjective:    Patient ID: Hannah [name], female    DOB: 1945-09-23, 73 y.o.   MRN: [id number]   ROV 01/23/18 --Mrs. Mcburney is a 73 year old overweight woman, never smoker with a history of CAD/CABG, AVR, atrial fibrillation (back on amiodarone since mid 2018), breast cancer (surgery, chemo, radiation), documented restrictive and obstructive lung disease based on PFT from 03/2009.  Most recently she has been having issues with syncope, intermittent tachycardia, following w Dr Lovena Le. Her breathing is stable. She uses her Symbicort reliably. She occasionally has chest tightness, relieved by albuterol - uses 2x a month. Her PNA shots are UTD rec Please continue Symbicort 2 puffs twice a day.  Remember to rinse and gargle after using this medication.  We will refill this for you today. Keep albuterol available to use 2 puffs if needed for shortness of breath, cough, chest tightness. We will plan to repeat your chest x-ray and your pulmonary function testing at the beginning of next year to monitor you while you are on amiodarone. Follow with Dr Lamonte Sakai in January with PFT on the same day  Jul 07 2018 GB surgery > 100% recovered    April 6th televisit She has A Fib on Amiodarone .  She complains of 1 week ago . Started initially with wet cough with clear to white mucus . Has worsened over last week with thick yellow mucus. Cough wears her out. Feels she get short of breath and cant get deep breath in.  Has started using her albuterol neb Twice daily  For last few days. Has been staying inside , trying not to get out in pollen. No watery eyes or nasal drainage.  rec Omnicef 300mg  Twice daily  For 7 days , take with food.  Mucinex DM Twice daily As needed  Cough  Claritin 10mg  daily As needed drainage.  Fluids and rest .  Continue on Symbicort 2 puffs Twice daily  , rinse after use.  If not improving , develop fever, increased shortness of breath or worsening symptoms call our office for sooner  follow up or go to Emergency Room  April 10 non- cardiac cp rx by Lovena Le d/c florinef, take bid lopressor ntg  and w/in 20 min of nitro cp resolved but breathing still short    09/25/2018  ov/Darely Becknell re: consultation re sob/ new R pleural effusion  Chief Complaint  Patient presents with  . Acute Visit    Pt c/o SOB with or without exertion x 3 wks. She is unable to lie down flat due to SOB and sleeps propped up. She has occ tickle in her throat but not coughing much. She is using her rescue inhaler 1-2 x per day.   acute onset cough productive yellowish x first of April 2020 rx televisit with omnicef as above/ no change maint rx  Cough gradually much  better but not the breathing. Dyspnea:  Comfortable sitting up / room to room at home  Cough: gone / still hoarse/ clearing throat Sleeping: immediate smothering helps to elevate hob about 30 degrees  SABA use: no better with albuterol / last used about 12 h prior to OV   02: none  Has no real change in leg swelling vs baseline   No obvious day to day or daytime variability or assoc excess/ purulent sputum or mucus plugs or hemoptysis or cp or chest tightness, subjective wheeze or overt sinus or hb symptoms.     Also denies any obvious fluctuation of symptoms with weather  or environmental changes or other aggravating or alleviating factors except as outlined above   No unusual exposure hx or h/o childhood pna/ asthma or knowledge of premature birth.  Current Allergies, Complete Past Medical History, Past Surgical History, Family History, and Social History were reviewed in [facility] record.  ROS  The following are not active complaints unless bolded Hoarseness, sore throat, dysphagia, dental problems, itching, sneezing,  nasal congestion or discharge of excess mucus or purulent secretions, ear ache,   fever, chills, sweats, unintended wt loss or wt gain, classically pleuritic or exertional cp,  orthopnea pnd or arm/hand  swelling  or leg swelling, presyncope, palpitations, abdominal pain, anorexia, nausea, vomiting, diarrhea  or change in bowel habits or change in bladder habits, change in stools or change in urine, dysuria, hematuria,  rash, arthralgias, visual complaints, headache, numbness, weakness or ataxia or problems with walking or coordination,  change in mood or  memory.        Current Meds  Medication Sig  . acetaminophen (TYLENOL) 325 MG tablet Take 2 tablets (650 mg total) by mouth every 6 (six) hours as needed for mild pain (or Fever >/= 101).  Marland Kitchen albuterol (PROVENTIL HFA;VENTOLIN HFA) 108 (90 Base) MCG/ACT inhaler Inhale 2 puffs into the lungs every 6 (six) hours as needed for wheezing or shortness of breath.  Marland Kitchen amiodarone (PACERONE) 200 MG tablet Take 200 mg by mouth daily.  . budesonide-formoterol (SYMBICORT) 160-4.5 MCG/ACT inhaler INHALE TWO PUFFS INTO THE LUNGS TWICE DAILY  . ELIQUIS 5 MG TABS tablet TAKE ONE TABLET BY MOUTH TWICE DAILY  . exemestane (AROMASIN) 25 MG tablet TAKE ONE TABLET BY MOUTH DAILY AFTER BREAKFAST  . ezetimibe (ZETIA) 10 MG tablet Take 1 tablet (10 mg total) by mouth daily.  . furosemide (LASIX) 20 MG tablet Take 1 tablet (20 mg total) by mouth daily as needed for fluid.  Marland Kitchen glucose blood (ACCU-CHEK GUIDE) test strip Use to test blood sugar 3 times daily  . insulin glargine (LANTUS) 100 UNIT/ML injection Inject 16 units under the skin once daily in the morning.  . insulin lispro (HUMALOG) 100 UNIT/ML injection Inject 4-6 units under the skin three times daily before meals.  . Insulin Pen Needle 32G X 4 MM MISC Use as instructed to inject insulin 4 times daily.  Marland Kitchen loratadine (CLARITIN) 10 MG tablet Take 10 mg by mouth daily as needed for allergies or rhinitis.   . metoprolol tartrate (LOPRESSOR) 25 MG tablet Take 1 tablet (25 mg total) by mouth 2 (two) times daily.  . nitroGLYCERIN (NITROSTAT) 0.4 MG SL tablet Place 1 tablet (0.4 mg total) under the tongue every 5 (five)  minutes as needed for chest pain.  . pantoprazole (PROTONIX) 40 MG tablet Take 1 tablet (40 mg total) by mouth daily.  . rosuvastatin (CRESTOR) 40 MG tablet Take 1 tablet (40 mg total) by mouth at bedtime.  Marland Kitchen zolpidem (AMBIEN) 5 MG tablet TAKE ONE TABLET BY MOUTH AT BEDTIME AS NEEDED FOR SLEEP            Objective:    amb pleasant hoarse wf nad  Wt Readings from Last 3 Encounters:  09/25/18 181 lb 3.2 oz (82.2 kg)  08/15/18 173 lb (78.5 kg)  08/13/18 175 lb 12.8 oz (79.7 kg)     Vital signs reviewed - Note on arrival 02 sats  100% on RA     HEENT: nl dentition, turbinates bilaterally, and oropharynx. Nl external ear canals without cough reflex  NECK :  without JVD/Nodes/TM/ nl carotid upstrokes bilaterally   LUNGS: no acc muscle use,  Nl contour chest with distant bs bilaterally esp R base with dullness to percussion.  CV:  RRR  Loud S2 no s3 or murmur or increase in P2, and  1+ sym  pitting both LE's   ABD:  soft and nontender with nl inspiratory excursion in the supine position. No bruits or organomegaly appreciated, bowel sounds nl  MS:  Nl gait/ ext warm without deformities, calf tenderness, cyanosis or clubbing No obvious joint restrictions   SKIN: warm and dry without lesions    NEURO:  alert, approp, nl sensorium with  no motor or cerebellar deficits apparent.     CXR PA and Lateral:   09/25/2018 :    I personally reviewed images and agree with radiology impression as follows:  New moderate right pleural effusion has developed.   Labs ordered/ reviewed:     Chemistry      Component Value Date/Time   NA 140 09/25/2018 1113   NA 143 08/13/2018 1400   NA 143 04/27/2016 1352   K 3.6 09/25/2018 1113   K 4.1 04/27/2016 1352   CL 104 09/25/2018 1113   CO2 30 09/25/2018 1113   CO2 29 04/27/2016 1352   BUN 26 (H) 09/25/2018 1113   BUN 19 08/13/2018 1400   BUN 29.5 (H) 04/27/2016 1352   CREATININE 1.57 (H) 09/25/2018 1113   CREATININE 1.66 (H) 05/23/2016  1143   CREATININE 1.3 (H) 04/27/2016 1352      Component Value Date/Time   CALCIUM 8.9 09/25/2018 1113                                                      Lab Results  Component Value Date   WBC 7.0 09/25/2018   HGB 10.4 (L) 09/25/2018   HCT 31.1 (L) 09/25/2018   MCV 99.0 09/25/2018   PLT 109.0 (L) 09/25/2018         Lab Results  Component Value Date   TSH 3.31 09/25/2018     Lab Results  Component Value Date   PROBNP 406.0 (H) 09/25/2018       Lab Results  Component Value Date   ESRSEDRATE 23 09/25/2018   ESRSEDRATE 14 01/29/2017                Assessment & Plan:

## 2018-09-25 NOTE — Assessment & Plan Note (Addendum)
  Lab Results  Component Value Date   HGB 10.4 (L) 09/25/2018   HGB 10.5 (L) 08/13/2018   HGB 10.7 (L) 07/04/2018   HGB 12.1 06/13/2018   HGB 10.8 (L) 12/23/2017   HGB 9.1 (L) 10/01/2017   HGB 8.5 (L) 04/27/2016   HGB 9.6 (L) 04/04/2016   HGB 7.3 (L) 03/30/2016     Normocytic and likely related to chronic dz but may be contributing to symptoms and will need be monitored, esp with plt trending down too.  Will send Dr Lindi Adie copy of this noted

## 2018-09-25 NOTE — Assessment & Plan Note (Signed)
09/25/2018  After extensive coaching inhaler device,  effectiveness =  75%  Asthma component well compensated, no change in rx needed      Total time devoted to counseling  > 50 % of initial  office visit/consultation  review case with pt/  device teaching which extended face to face time for this visit/  direct observation of amb 02 saturation study/ discussion of options/alternatives/ personally creating written customized instructions  in presence of pt  then going over those specific  Instructions directly with the pt including how to use all of the meds but in particular covering each new medication in detail and the difference between the maintenance= "automatic" meds and the prns using an action plan format for the latter (If this problem/symptom => do that organization reading Left to right).  Please see AVS from this visit for a full list of these instructions which I personally wrote for this pt and  are unique to this visit.

## 2018-09-25 NOTE — Assessment & Plan Note (Signed)
Lab Results  Component Value Date   PLT 109.0 (L) 09/25/2018   PLT 133 (L) 08/13/2018   PLT 130 (L) 07/04/2018   PLT 131.0 (L) 06/13/2018   PLT 154 12/23/2017   PLT 146 (L) 10/01/2017    Unclear etiolgy/ f/u Dr Lindi Adie rec

## 2018-09-29 ENCOUNTER — Telehealth: Payer: Self-pay | Admitting: Internal Medicine

## 2018-09-29 DIAGNOSIS — J90 Pleural effusion, not elsewhere classified: Secondary | ICD-10-CM

## 2018-09-29 NOTE — Telephone Encounter (Signed)
Called & spoke w/ pt regarding MW's recommendations. Pt verbalized understanding and agreed to orders for R thoracentesis asap by u/s guided IR for glucose, protein, Idh, cell count and diff and cytology and albumin. Notified pt to hold her eliquis x3 doses prior to procedure or to follow IR requirements once she is called for scheduling. Pt expressed understanding with no additional questions.  Order has been placed. Will route to MW as an Pharmacist, hospital. Nothing further needed at this time.

## 2018-09-29 NOTE — Telephone Encounter (Signed)
Primary Pulmonologist: Dr. Christinia Gully Last office visit and with whom: 09/25/2018 w/ MW What do we see them for (pulmonary problems): DOE, Pleural effusion on the R, Anemia, Thrombocytopenia, Mild persistent asthma  Reason for call: Pt last seen in-office 09/25/2018. States she is still SOB, especially when she lays down to rest and w/ exertion. Denies fever/chills/sweats. States she has an adjustable bed and props herself up on two pillows at night. Taking Symbicort 160 & albuterol inhalers, pantoprazole, and famotidine as directed. Taking lasix as directed; states she notices the swelling in her ankles going down, but no changes in her breathing. Had CXR done LOV resulted per MW. States MW mentioned due to fluid in her R lung, she may have to have a thoracentesis if her other therapies are ineffective.     In the last month, have you been in contact with someone who was confirmed or suspected to have Conoravirus / COVID-19?  No  Do you have any of the following symptoms developed in the last 30 days? Fever: No Cough: Yes Shortness of breath: Yes  When did your symptoms start?  See MyChart message from 09/24/2018; symptoms started 4 weeks ago.  If the patient has a fever, what is the last reading?  (use n/a if patient denies fever)  N/A . IF THE PATIENT STATES THEY DO NOT OWN A THERMOMETER, THEY MUST GO AND PURCHASE ONE When did the fever start?: N/A Have you taken any medication to suppress a fever (ie Ibuprofen, Aleve, Tylenol)?: N/A  MW please advise on your recommendations for this patient. Thank you.

## 2018-09-29 NOTE — Telephone Encounter (Signed)
Yes needs R thoracentesis asap by u/s guidance IR for glucose, protein, ldh, cell count and diff and cytology and albumin   Will need to hold her eliquis x 3 doses prior or whatever IR requires

## 2018-09-30 ENCOUNTER — Other Ambulatory Visit: Payer: Self-pay

## 2018-09-30 ENCOUNTER — Ambulatory Visit (INDEPENDENT_AMBULATORY_CARE_PROVIDER_SITE_OTHER): Payer: Medicare Other | Admitting: *Deleted

## 2018-09-30 DIAGNOSIS — R55 Syncope and collapse: Secondary | ICD-10-CM

## 2018-10-01 LAB — CUP PACEART REMOTE DEVICE CHECK
Date Time Interrogation Session: 20200421200531
Implantable Pulse Generator Implant Date: 20190322

## 2018-10-03 ENCOUNTER — Other Ambulatory Visit: Payer: Self-pay

## 2018-10-03 ENCOUNTER — Telehealth: Payer: Self-pay | Admitting: Medical

## 2018-10-03 ENCOUNTER — Ambulatory Visit (HOSPITAL_COMMUNITY)
Admission: RE | Admit: 2018-10-03 | Discharge: 2018-10-03 | Disposition: A | Discharge status: Home / Self Care | Payer: Medicare Other | Source: Ambulatory Visit | Attending: Internal Medicine | Admitting: Internal Medicine

## 2018-10-03 ENCOUNTER — Encounter: Payer: Self-pay | Admitting: Medical

## 2018-10-03 ENCOUNTER — Encounter (HOSPITAL_COMMUNITY): Payer: Self-pay | Admitting: Student

## 2018-10-03 ENCOUNTER — Other Ambulatory Visit: Payer: Self-pay | Admitting: Internal Medicine

## 2018-10-03 DIAGNOSIS — I700 Atherosclerosis of aorta: Secondary | ICD-10-CM | POA: Diagnosis not present

## 2018-10-03 DIAGNOSIS — J90 Pleural effusion, not elsewhere classified: Secondary | ICD-10-CM | POA: Diagnosis present

## 2018-10-03 HISTORY — PX: IR THORACENTESIS ASP PLEURAL SPACE W/IMG GUIDE: IMG5380

## 2018-10-03 LAB — LACTATE DEHYDROGENASE, PLEURAL OR PERITONEAL FLUID: LD, Fluid: 46 U/L — ABNORMAL HIGH (ref 3–23)

## 2018-10-03 LAB — ALBUMIN, PLEURAL OR PERITONEAL FLUID: Albumin, Fluid: 1.1 g/dL

## 2018-10-03 LAB — BODY FLUID CELL COUNT WITH DIFFERENTIAL
Eos, Fluid: 1 %
Lymphs, Fluid: 45 %
Monocyte-Macrophage-Serous Fluid: 49 % — ABNORMAL LOW (ref 50–90)
Neutrophil Count, Fluid: 5 % (ref 0–25)
Total Nucleated Cell Count, Fluid: 419 cu mm (ref 0–1000)

## 2018-10-03 LAB — PROTEIN, PLEURAL OR PERITONEAL FLUID: Total protein, fluid: <3 g/dL

## 2018-10-03 LAB — GLUCOSE, PLEURAL OR PERITONEAL FLUID: Glucose, Fluid: 126 mg/dL

## 2018-10-03 MED ORDER — LIDOCAINE HCL 1 % IJ SOLN
INTRAMUSCULAR | Status: AC
  Filled 2018-10-03: qty 20

## 2018-10-03 MED ORDER — LIDOCAINE HCL 1 % IJ SOLN
INTRAMUSCULAR | Status: DC | PRN
  Administered 2018-10-03: 10 mL

## 2018-10-03 NOTE — Telephone Encounter (Signed)
Refill Request: Zolpidem    Last RX:09/02/18 Last OV:07/30/18 Next OV: None scheduled  UDS:11/20/17 CSC:11/20/17 CSR:

## 2018-10-03 NOTE — Telephone Encounter (Signed)
Opened to review 

## 2018-10-03 NOTE — Procedures (Signed)
PROCEDURE SUMMARY:  Successful image-guided right thoracentesis. Yielded 600 milliliters of clear gold fluid. Patient tolerated procedure well. No immediate complications. EBL = 0 mL.  Specimen was sent for labs. CXR ordered.  Claris Pong Louk PA-C 10/03/2018 10:07 AM

## 2018-10-06 NOTE — Progress Notes (Signed)
Spoke with pt and notified of results per Dr. Melvyn Novas. Pt verbalized understanding and denied any questions. Pt reports her breathing seems to be worse for the past 1-2 days so appt scheduled with cxr for 1 wk

## 2018-10-07 NOTE — Progress Notes (Signed)
[facility] Summary Report / Loop Recorder 

## 2018-10-13 ENCOUNTER — Encounter: Payer: Self-pay | Admitting: Internal Medicine

## 2018-10-13 ENCOUNTER — Other Ambulatory Visit: Payer: Self-pay

## 2018-10-13 ENCOUNTER — Ambulatory Visit (INDEPENDENT_AMBULATORY_CARE_PROVIDER_SITE_OTHER): Payer: Medicare Other | Admitting: Internal Medicine

## 2018-10-13 ENCOUNTER — Ambulatory Visit (INDEPENDENT_AMBULATORY_CARE_PROVIDER_SITE_OTHER): Payer: Medicare Other

## 2018-10-13 VITALS — BP 130/58 | HR 60 | Temp 97.8°F | Ht 65.0 in | Wt 175.0 lb

## 2018-10-13 DIAGNOSIS — J90 Pleural effusion, not elsewhere classified: Secondary | ICD-10-CM | POA: Diagnosis not present

## 2018-10-13 DIAGNOSIS — J45909 Unspecified asthma, uncomplicated: Secondary | ICD-10-CM

## 2018-10-13 MED ORDER — SPIRONOLACTONE 25 MG PO TABS: 25.0000 mg | ORAL_TABLET | Freq: Two times a day (BID) | ORAL | 2 refills | Status: DC

## 2018-10-13 NOTE — Progress Notes (Signed)
Subjective:    Patient ID: Hannah [name], female    DOB: April 10, 1946 .   MRN: [id number]   ROV 01/23/18 --[name] is a 73 year old overweight woman, never smoker with a history of CAD/CABG, AVR, atrial fibrillation (back on amiodarone since mid 2018), breast cancer (surgery, chemo, radiation), documented restrictive and obstructive lung disease based on PFT from 03/2009.  Most recently she has been having issues with syncope, intermittent tachycardia, following w Hannah [name]. Her breathing is stable. She uses her Symbicort reliably. She occasionally has chest tightness, relieved by albuterol - uses 2x a month. Her PNA shots are UTD rec Please continue Symbicort 2 puffs twice a day.  Remember to rinse and gargle after using this medication.  We will refill this for you today. Keep albuterol available to use 2 puffs if needed for shortness of breath, cough, chest tightness. We will plan to repeat your chest x-ray and your pulmonary function testing at the beginning of next year to monitor you while you are on amiodarone. Follow with Hannah [name] in January with PFT on the same day  Jul 07 2018 GB surgery > 100% recovered    April 6th televisit She has A Fib on Amiodarone .  She complains of 1 week ago . Started initially with wet cough with clear to white mucus . Has worsened over last week with thick yellow mucus. Cough wears her out. Feels she get short of breath and cant get deep breath in.  Has started using her albuterol neb Twice daily  For last few days. Has been staying inside , trying not to get out in pollen. No watery eyes or nasal drainage.  rec Omnicef 300mg  Twice daily  For 7 days , take with food.  Mucinex DM Twice daily As needed  Cough  Claritin 10mg  daily As needed drainage.  Fluids and rest .  Continue on Symbicort 2 puffs Twice daily  , rinse after use.  If not improving , develop fever, increased shortness of breath or worsening symptoms call our office for sooner follow up or  go to Emergency Room  April 10 non- cardiac cp rx by Lovena [name] d/c florinef, take bid lopressor ntg  and w/in 20 min of nitro cp resolved but breathing still short    09/25/2018  ov/Hannah [name] re: consultation re sob/ new R pleural effusion  Chief Complaint  Patient presents with  . Acute Visit    Pt c/o SOB with or without exertion x 3 wks. She is unable to lie down flat due to SOB and sleeps propped up. She has occ tickle in her throat but not coughing much. She is using her rescue inhaler 1-2 x per day.   acute onset cough productive yellowish x first of April 2020 rx televisit with omnicef as above/ no change maint rx  Cough gradually much  better but not the breathing. Dyspnea:  Comfortable sitting up / room to room at home  Cough: gone / still hoarse/ clearing throat Sleeping: immediate smothering helps to elevate hob about 30 degrees  SABA use: no better with albuterol / last used about 12 h prior to OV   02: none  Has no real change in leg swelling vs baseline rec Symbicort 160 should be Take 2 puffs first thing in am and then another 2 puffs about 12 hours later.  Work on inhaler technique:   Take the lasix 20 mg daily x 3 days then only as needed for swelling  Continue Pantoprazole (protonix)  40 mg   Take  30-60 min before first meal of the day and Pepcid (famotidine)  20 mg one after supper until return to office - this is the best way to tell whether stomach acid is contributing to your problem.  GERD diet - u/s thoracentesis 10/03/2018  x 600 cc transudate cytology >>> neg    10/13/2018  f/u ov/Hannah [name] re:  Chief Complaint  Patient presents with  . Follow-up    CXR done today. Pt states her breathing is about the same since the last visit.  She is using her rescue inhaler 2-3 x per day.   p tcentesis x 600 cc x  Better x 2-3 days and could lie flat now now 30 degrees plus pillows Dyspnea:  Sitting still ok/ room to room sob  Cough: none Sleeping: ok as long as elevated  SABA use: as  above but not helping  02: none  Does not normally  Have much [name] edema  but started early march 2020 and improves but does not resolve with lasix    No obvious day to day or daytime variability or assoc excess/ purulent sputum or mucus plugs or hemoptysis or cp or chest tightness, subjective wheeze or overt sinus or hb symptoms.    Also denies any obvious fluctuation of symptoms with weather or environmental changes or other aggravating or alleviating factors except as outlined above   No unusual exposure hx or h/o childhood pna/ asthma or knowledge of premature birth.  Current Allergies, Complete Past Medical History, Past Surgical History, Family History, and Social History were reviewed in [facility] record.  ROS  The following are not active complaints unless bolded Hoarseness, sore throat, dysphagia, dental problems, itching, sneezing,  nasal congestion or discharge of excess mucus or purulent secretions, ear ache,   fever, chills, sweats, unintended wt loss or wt gain, classically pleuritic or exertional cp,  orthopnea pnd or arm/hand swelling  or leg swelling, presyncope, palpitations, abdominal pain, anorexia, nausea, vomiting, diarrhea  or change in bowel habits or change in bladder habits, change in stools or change in urine, dysuria, hematuria,  rash, arthralgias, visual complaints, headache, numbness, weakness or ataxia or problems with walking or coordination,  change in mood or  memory.        Current Meds  Medication Sig  . acetaminophen (TYLENOL) 325 MG tablet Take 2 tablets (650 mg total) by mouth every 6 (six) hours as needed for mild pain (or Fever >/= 101).  Marland Kitchen albuterol (PROVENTIL HFA;VENTOLIN HFA) 108 (90 Base) MCG/ACT inhaler Inhale 2 puffs into the lungs every 6 (six) hours as needed for wheezing or shortness of breath.  Marland Kitchen amiodarone (PACERONE) 200 MG tablet Take 200 mg by mouth daily.  . budesonide-formoterol (SYMBICORT) 160-4.5 MCG/ACT inhaler  INHALE TWO PUFFS INTO THE LUNGS TWICE DAILY  . ELIQUIS 5 MG TABS tablet TAKE ONE TABLET BY MOUTH TWICE DAILY  . exemestane (AROMASIN) 25 MG tablet TAKE ONE TABLET BY MOUTH DAILY AFTER BREAKFAST  . ezetimibe (ZETIA) 10 MG tablet Take 1 tablet (10 mg total) by mouth daily.  . famotidine (PEPCID) 20 MG tablet One after supper  . furosemide (LASIX) 20 MG tablet Take 1 tablet (20 mg total) by mouth daily as needed for fluid.  Marland Kitchen glucose blood (ACCU-CHEK GUIDE) test strip Use to test blood sugar 3 times daily  . insulin glargine (LANTUS) 100 UNIT/ML injection Inject 16 units under the skin once daily in the morning.  . insulin lispro (  HUMALOG) 100 UNIT/ML injection Inject 4-6 units under the skin three times daily before meals.  . Insulin Pen Needle 32G X 4 MM MISC Use as instructed to inject insulin 4 times daily.  Marland Kitchen loratadine (CLARITIN) 10 MG tablet Take 10 mg by mouth daily as needed for allergies or rhinitis.   . metoprolol tartrate (LOPRESSOR) 25 MG tablet Take 1 tablet (25 mg total) by mouth 2 (two) times daily.  . nitroGLYCERIN (NITROSTAT) 0.4 MG SL tablet Place 1 tablet (0.4 mg total) under the tongue every 5 (five) minutes as needed for chest pain.  . pantoprazole (PROTONIX) 40 MG tablet Take 1 tablet (40 mg total) by mouth daily.  . rosuvastatin (CRESTOR) 40 MG tablet Take 1 tablet (40 mg total) by mouth at bedtime.  Marland Kitchen zolpidem (AMBIEN) 5 MG tablet TAKE ONE TABLET BY MOUTH AT BEDTIME AS NEEDED FOR SLEEP                   Objective:    amb wf nad   10/13/2018          175   09/25/18 181 lb 3.2 oz (82.2 kg)  08/15/18 173 lb (78.5 kg)  08/13/18 175 lb 12.8 oz (79.7 kg)     Vital signs reviewed - Note on arrival 02 sats  95% on RA        HEENT: nl dentition, turbinates bilaterally, and oropharynx. Nl external ear canals without cough reflex   NECK :  without JVD/Nodes/TM/ nl carotid upstrokes bilaterally   LUNGS: no acc muscle use,  Nl contour chest which is clear to A  and P bilaterally x for minimal decrease bs/ dullness at R Base   CV:  RRR  no s3 or murmur or increase in P2, and trace pitting edema B LEs    ABD:  soft and nontender with nl inspiratory excursion in the supine position. No bruits or organomegaly appreciated, bowel sounds nl  MS:  Nl gait/ ext warm without deformities, calf tenderness, cyanosis or clubbing No obvious joint restrictions   SKIN: warm and dry without lesions    NEURO:  alert, approp, nl sensorium with  no motor or cerebellar deficits apparent.      CXR PA and Lateral:   10/13/2018 :    I personally reviewed images and agree with radiology impression as follows:    1. Partial reaccumulation of right pleural effusion. The volume is less than was present on 09/25/2018. 2. Stable borderline cardiomegaly without evidence of pulmonary edema.         Assessment & Plan:

## 2018-10-13 NOTE — Patient Instructions (Signed)
Aldactone 25 mg twice daily automatically and use lasix as needed to keep your legs free of fluid   Please see patient coordinator before you leave today  to schedule echocardiogram  Please schedule a follow up office visit in 2 weeks, sooner if needed

## 2018-10-14 ENCOUNTER — Telehealth: Payer: Self-pay | Admitting: Internal Medicine

## 2018-10-14 ENCOUNTER — Encounter: Payer: Self-pay | Admitting: Internal Medicine

## 2018-10-14 NOTE — Assessment & Plan Note (Signed)
All goals of chronic asthma control met including optimal function and elimination of symptoms with minimal need for rescue therapy.  Contingencies discussed in full including contacting this office immediately if not controlling the symptoms using the rule of two's.      I had an extended discussion with the patient reviewing all relevant studies completed to date and  lasting 15 to 20 minutes of a 25 minute visit    Each maintenance medication was reviewed in detail including most importantly the difference between maintenance and prns and under what circumstances the prns are to be triggered using an action plan format that is not reflected in the computer generated alphabetically organized AVS.     Please see AVS for specific instructions unique to this visit that I personally wrote and verbalized to the the pt in detail and then reviewed with pt  by my nurse highlighting any  changes in therapy recommended at today's visit to their plan of care.

## 2018-10-14 NOTE — Telephone Encounter (Signed)
Yes if that's the best they can do

## 2018-10-14 NOTE — Assessment & Plan Note (Signed)
S/p GB surgery  Jul 07 2018  New dx 09/25/2018 assoc with intermediate bnp, mild peripheral edema  - u/s thoracentesis 10/03/2018  x 600 cc transudate cytology >>> neg   Marked improvement in symptoms p tap have now relapsed with mild atx still present p tap suggesting the primary problem may well have been atx R base p gb surgery a very common complaint, making pleural pressures more negative at a time with pleural capillary pressures were higher due to mild chf    rec  Try adding aldactone 25 bid and IS to see if atx can be eliminated  F/u in 2 weeks with cxr and bmet Advised no  K supplements  Repeat echo  Discussed in detail all the  indications, usual  risks and alternatives  relative to the benefits with patient who agrees to proceed with w/u and  rx as outlined.

## 2018-10-14 NOTE — Telephone Encounter (Signed)
Called and spoke with patient regarding Echo Cardio on 12-10-2018 Advised patient that MW is aware of this note Pt verbalized and expressed understanding Nothing further needed

## 2018-10-14 NOTE — Telephone Encounter (Signed)
Dr.Wert  Echo has been scheduled 7/1 is that ol?  Aldactone 25 mg twice daily automatically and use lasix as needed to keep your legs free of fluid   Please see patient coordinator before you leave today  to schedule echocardiogram  Please schedule a follow up office visit in 2 weeks, sooner if needed     Instructions   Aldactone 25 mg twice daily automatically and use lasix as needed to keep your legs free of fluid   Please see patient coordinator before you leave today  to schedule echocardiogram  Please schedule a follow up office visit in 2 weeks, sooner if needed

## 2018-10-17 ENCOUNTER — Telehealth (HOSPITAL_COMMUNITY): Payer: Self-pay

## 2018-10-17 IMAGING — CR DG HAND COMPLETE 3+V*L*
3 series · 3 of 3 positions shown · non-contrast
Comparison: None.

CLINICAL DATA: Left hand and wrist pain for several weeks, no
injury

EXAM:
LEFT HAND - COMPLETE 3+ VIEW

[x hand pa left]
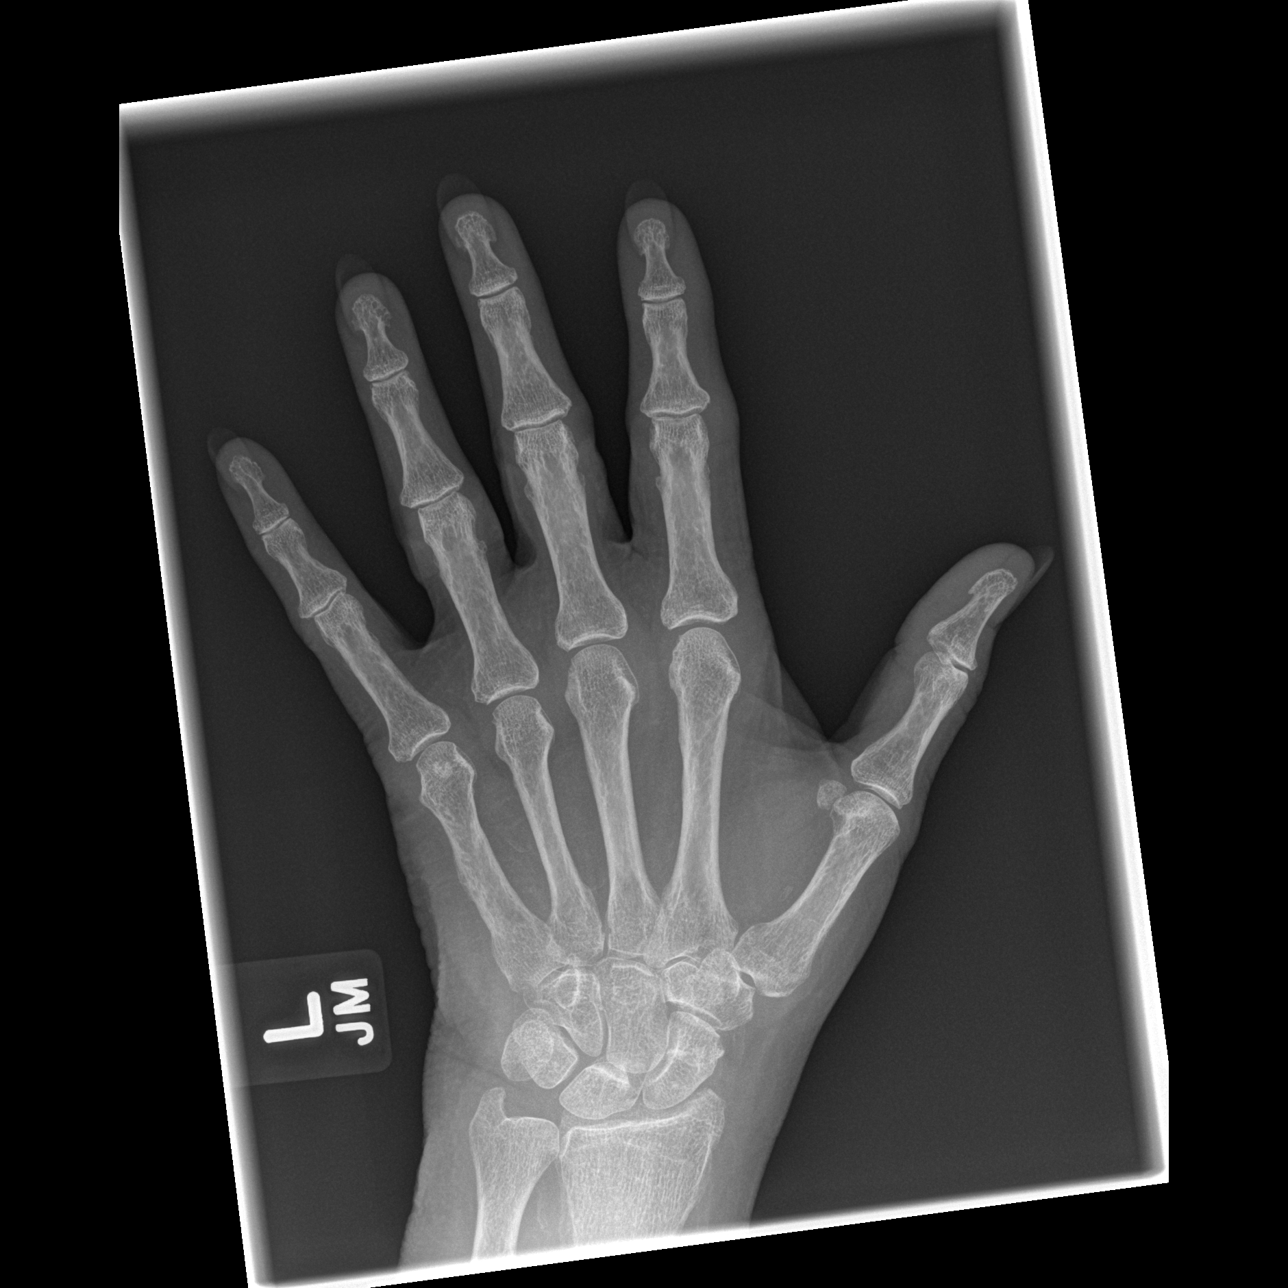

[x hand oblique left]
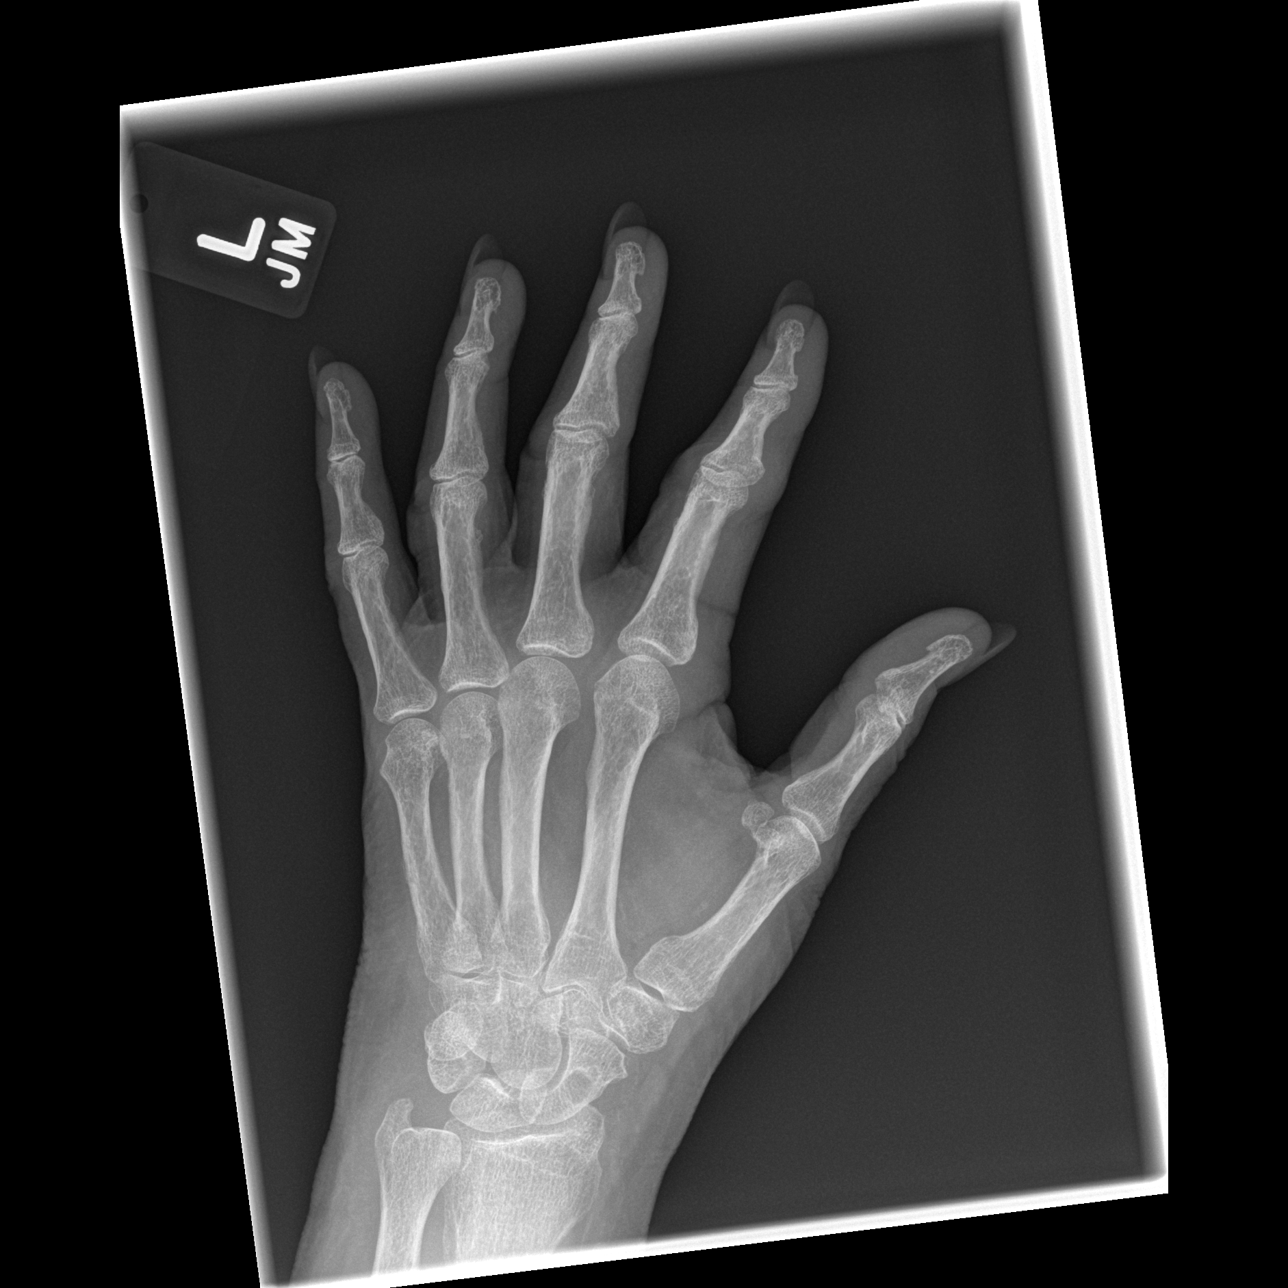

[x hand lat left]
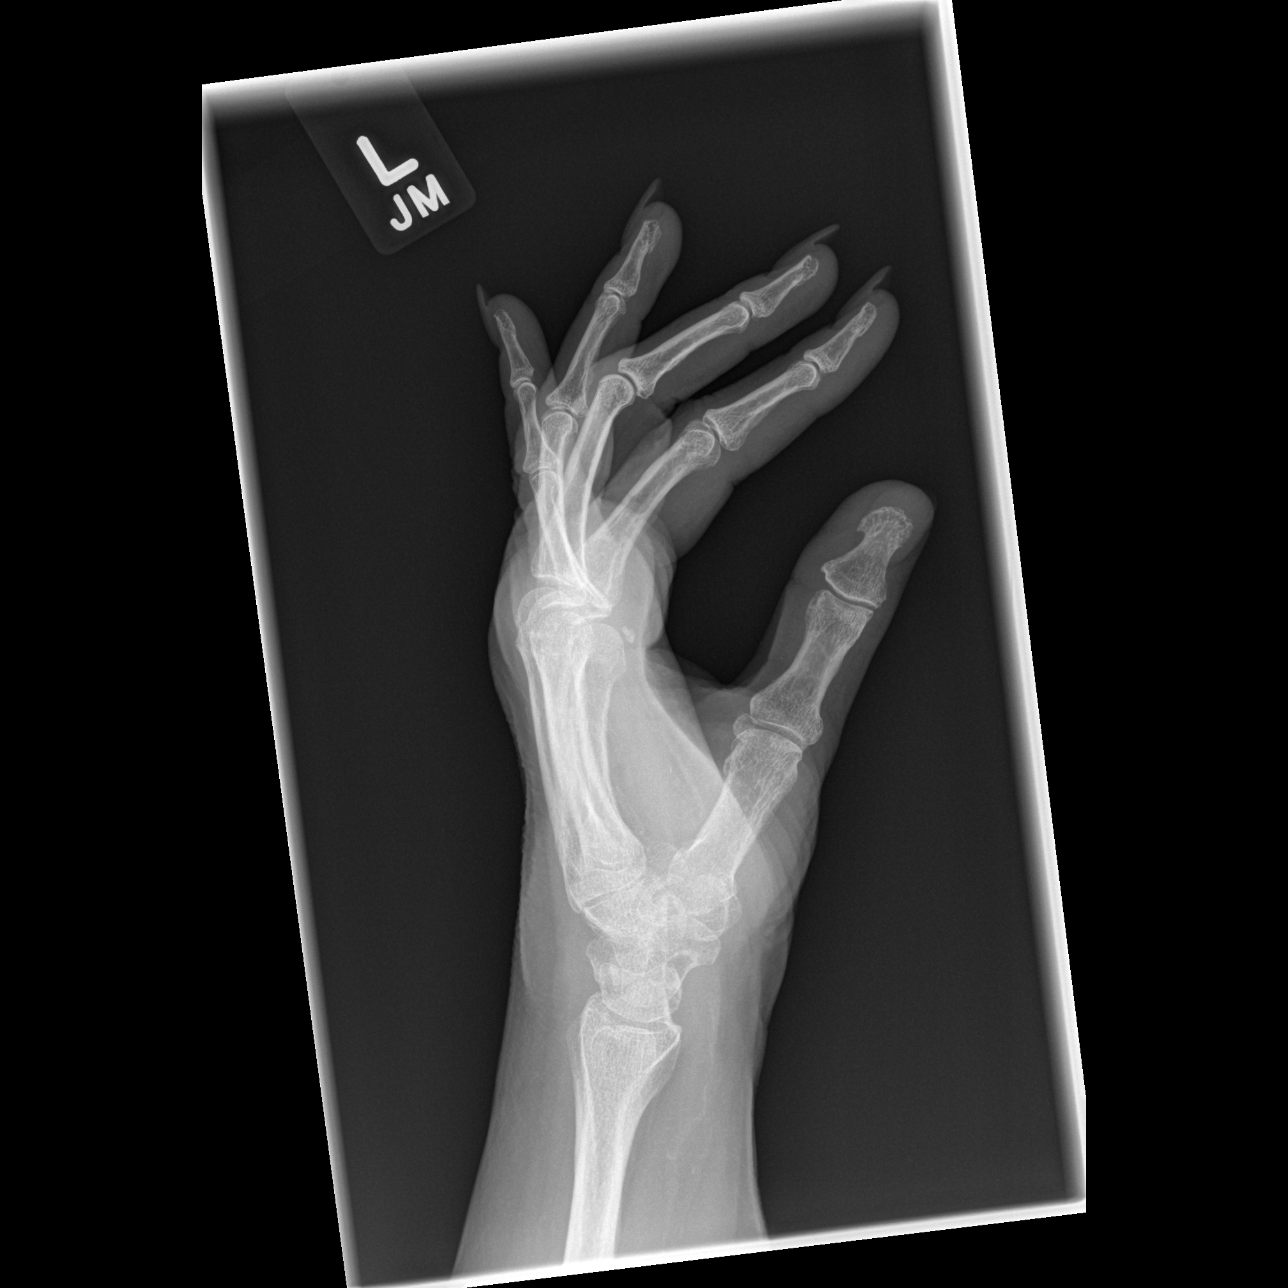

[3 of 3 positions shown; findings below may reference images not displayed]

FINDINGS: The radiocarpal joint space appears normal and the carpal bones are
in normal position. MCP, PIP, and DIP joints are unremarkable. No
erosion is seen and minimal degenerative changes present for age. No
soft tissue calcification is noted.
IMPRESSION: No explanation for the patient's pain is seen. No significant
degenerative change or erosion is noted.

## 2018-10-17 NOTE — Telephone Encounter (Signed)

## 2018-10-20 ENCOUNTER — Other Ambulatory Visit: Payer: Self-pay

## 2018-10-20 ENCOUNTER — Ambulatory Visit (HOSPITAL_COMMUNITY): Payer: Medicare Other | Attending: Cardiology

## 2018-10-20 DIAGNOSIS — I252 Old myocardial infarction: Secondary | ICD-10-CM | POA: Diagnosis not present

## 2018-10-20 DIAGNOSIS — Z951 Presence of aortocoronary bypass graft: Secondary | ICD-10-CM | POA: Insufficient documentation

## 2018-10-20 DIAGNOSIS — R0600 Dyspnea, unspecified: Secondary | ICD-10-CM | POA: Diagnosis not present

## 2018-10-20 DIAGNOSIS — I429 Cardiomyopathy, unspecified: Secondary | ICD-10-CM | POA: Insufficient documentation

## 2018-10-20 DIAGNOSIS — J90 Pleural effusion, not elsewhere classified: Secondary | ICD-10-CM | POA: Diagnosis present

## 2018-10-20 DIAGNOSIS — Z8249 Family history of ischemic heart disease and other diseases of the circulatory system: Secondary | ICD-10-CM | POA: Diagnosis not present

## 2018-10-20 DIAGNOSIS — E785 Hyperlipidemia, unspecified: Secondary | ICD-10-CM | POA: Diagnosis not present

## 2018-10-20 DIAGNOSIS — I130 Hypertensive heart and chronic kidney disease with heart failure and stage 1 through stage 4 chronic kidney disease, or unspecified chronic kidney disease: Secondary | ICD-10-CM | POA: Diagnosis not present

## 2018-10-20 DIAGNOSIS — Z923 Personal history of irradiation: Secondary | ICD-10-CM | POA: Insufficient documentation

## 2018-10-20 DIAGNOSIS — Z952 Presence of prosthetic heart valve: Secondary | ICD-10-CM | POA: Insufficient documentation

## 2018-10-20 DIAGNOSIS — N189 Chronic kidney disease, unspecified: Secondary | ICD-10-CM | POA: Diagnosis not present

## 2018-10-20 DIAGNOSIS — I509 Heart failure, unspecified: Secondary | ICD-10-CM | POA: Diagnosis not present

## 2018-10-20 DIAGNOSIS — Z8673 Personal history of transient ischemic attack (TIA), and cerebral infarction without residual deficits: Secondary | ICD-10-CM | POA: Diagnosis not present

## 2018-10-20 DIAGNOSIS — E1122 Type 2 diabetes mellitus with diabetic chronic kidney disease: Secondary | ICD-10-CM | POA: Insufficient documentation

## 2018-10-20 DIAGNOSIS — J45909 Unspecified asthma, uncomplicated: Secondary | ICD-10-CM | POA: Insufficient documentation

## 2018-10-20 DIAGNOSIS — I2510 Atherosclerotic heart disease of native coronary artery without angina pectoris: Secondary | ICD-10-CM | POA: Insufficient documentation

## 2018-10-21 ENCOUNTER — Other Ambulatory Visit: Payer: Self-pay | Admitting: Endocrinology

## 2018-10-22 NOTE — Progress Notes (Signed)
Spoke with pt and notified of results per Dr. Wert. Pt verbalized understanding and denied any questions. 

## 2018-10-25 IMAGING — MG 2D DIGITAL DIAGNOSTIC BILATERAL MAMMOGRAM WITH CAD AND ADJUNCT T
8 of 14 series · 8 of 30 positions shown · non-contrast
Comparison: Previous exam(s).

CLINICAL DATA: History of right breast cancer status post
lumpectomy January 2016.

EXAM:
2D DIGITAL DIAGNOSTIC BILATERAL MAMMOGRAM WITH CAD AND ADJUNCT TOMO
ULTRASOUND LEFT BREAST

[R CC (1 of 2)]
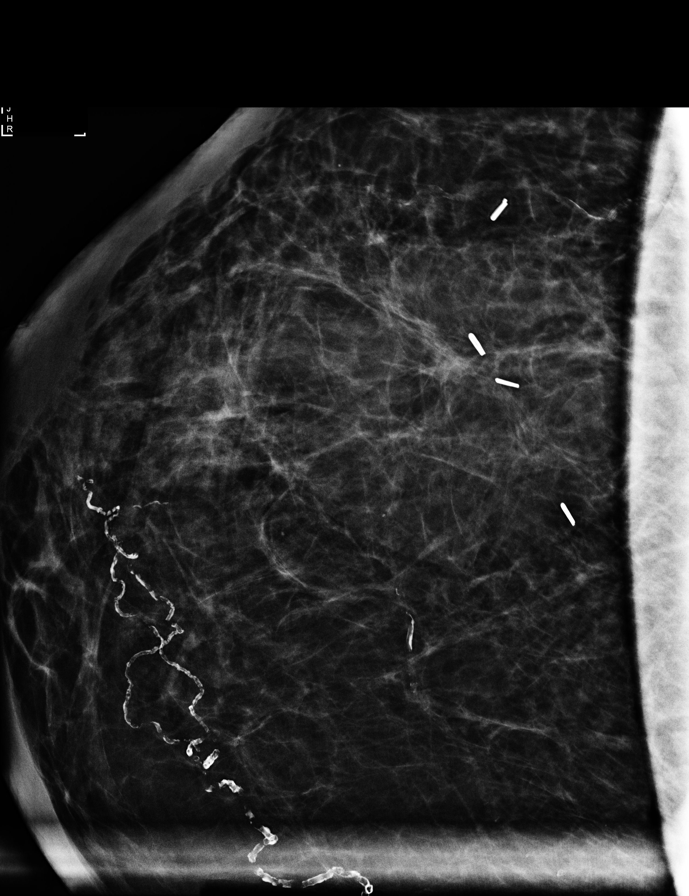

[R CC (2 of 2)]
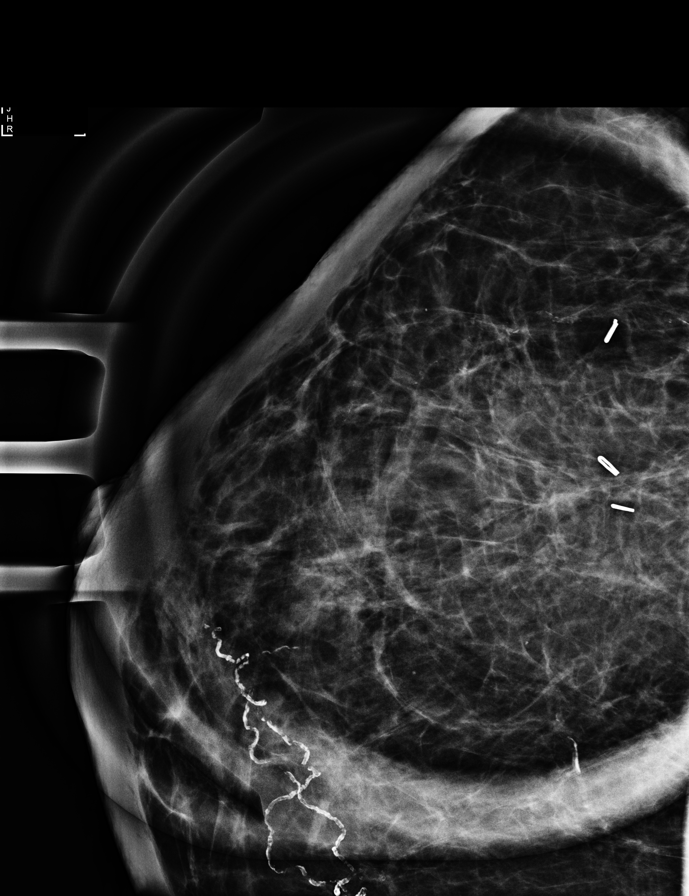

[L MLO synth-2D]
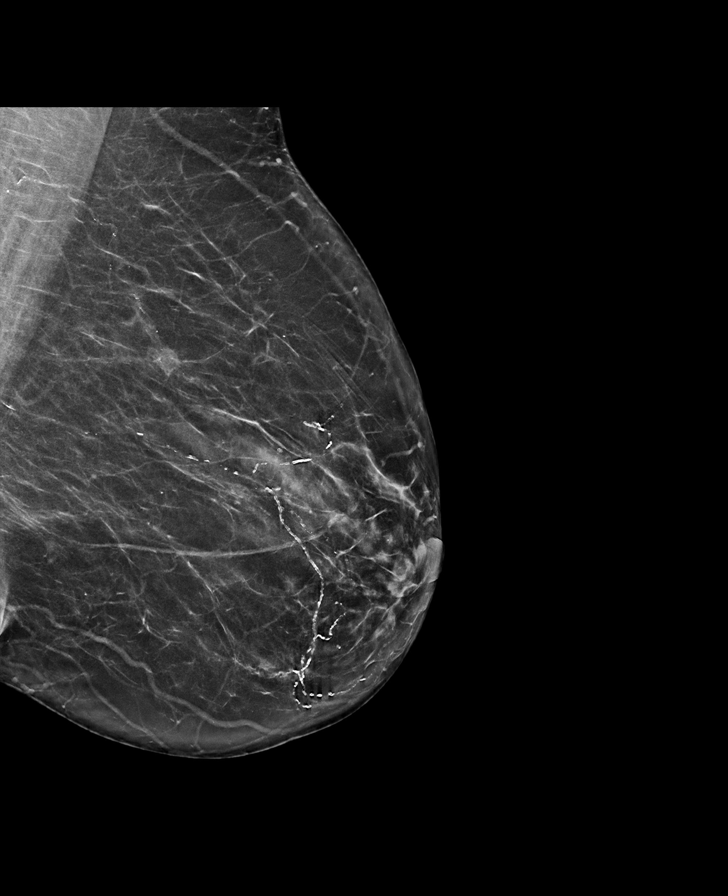

[R CC synth-2D]
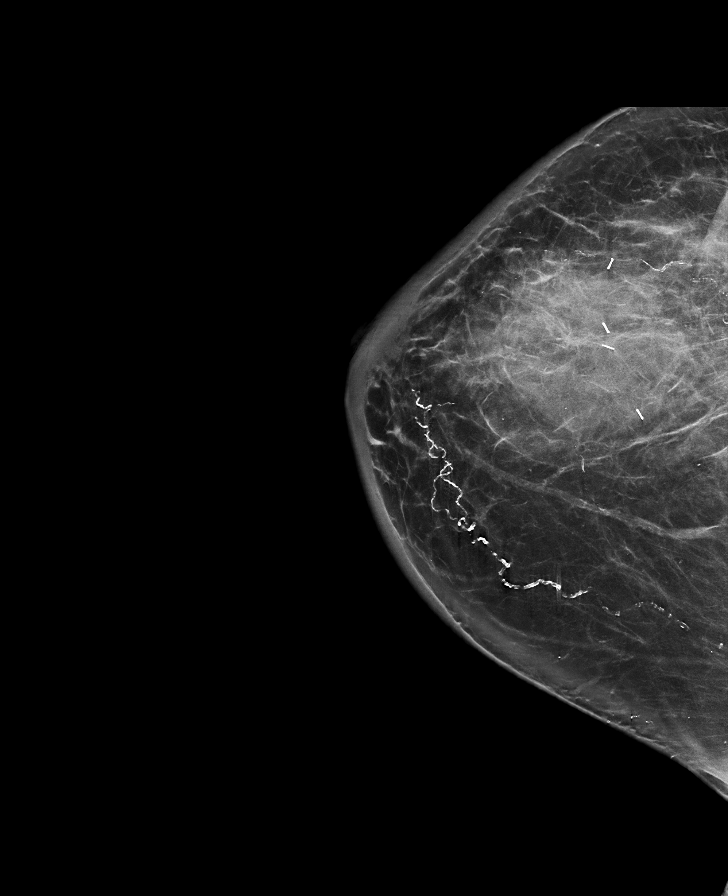

[R MLO]
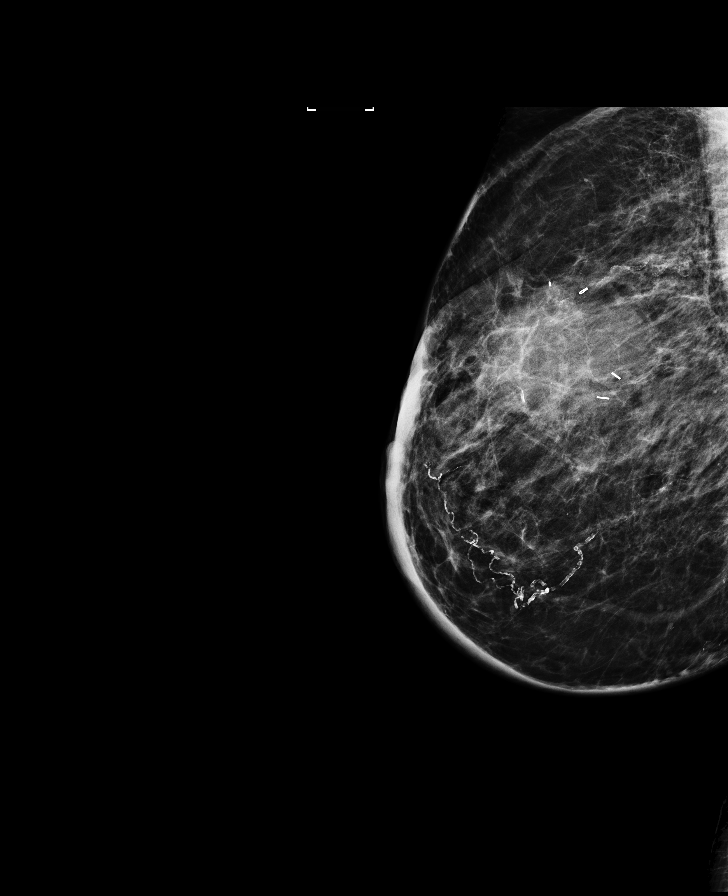

[L CC]
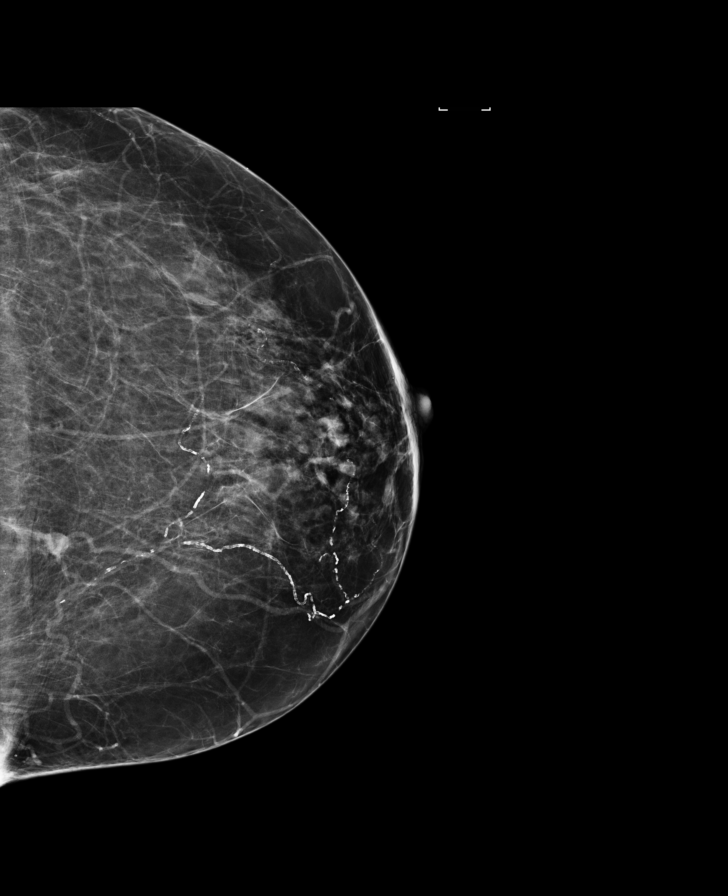

[L MLO]
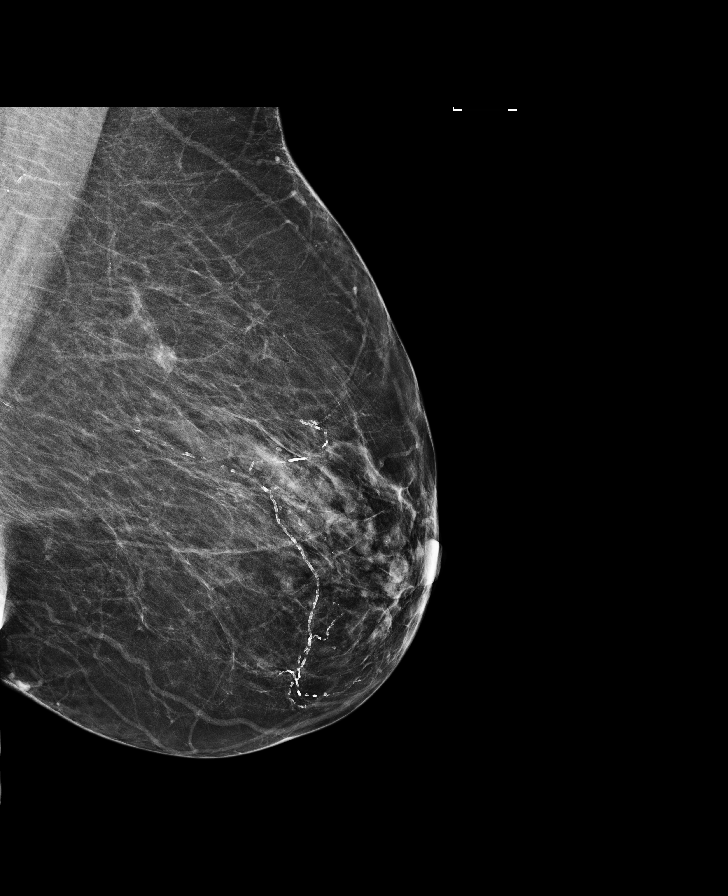

[L CC synth-2D]
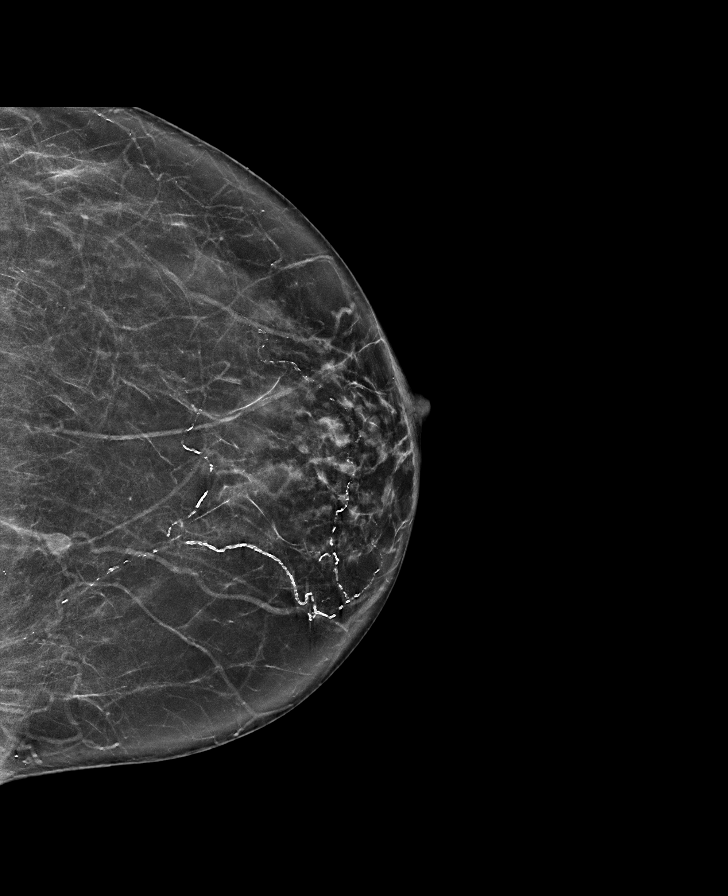

[8 of 30 positions shown; findings below may reference images not displayed]

ACR Breast Density Category b: There are scattered areas of
fibroglandular density.
FINDINGS: Lumpectomy changes are seen in the upper-outer quadrant of the right
breast. No suspicious mass or malignant type microcalcifications are
seen in the right breast.

In the upper-inner quadrant of the left breast there is a new
superficial focal asymmetric density. There are no malignant type
microcalcifications in the left breast.

Mammographic images were processed with CAD.

On physical exam, there is a Port-A-Cath scar in the upper-inner
quadrant of the left breast. There is no palpable mass.

Targeted ultrasound is performed, showing focal hypoechoic area in
the skin of the left breast 11 o'clock 11 cm from the nipple at the
surgical scar. Directly adjacent to this are elongated benign cystic
spaces. The largest cystic space 6 x 6 x 5 mm.
IMPRESSION: Lumpectomy changes in the right breast. Changes in the left breast
are secondary to a previously placed Port-A-Cath. No evidence of
malignancy in either breast.

RECOMMENDATION:
Bilateral diagnostic mammogram in 1 year is recommended.

I have discussed the findings and recommendations with the patient.
Results were also provided in writing at the conclusion of the
visit. If applicable, a reminder letter will be sent to the patient
regarding the next appointment.

BI-RADS CATEGORY  2: Benign.

## 2018-10-27 ENCOUNTER — Other Ambulatory Visit: Payer: Self-pay | Admitting: Internal Medicine

## 2018-10-28 ENCOUNTER — Telehealth: Payer: Self-pay | Admitting: *Deleted

## 2018-10-28 ENCOUNTER — Ambulatory Visit (INDEPENDENT_AMBULATORY_CARE_PROVIDER_SITE_OTHER): Payer: Medicare Other | Admitting: Internal Medicine

## 2018-10-28 ENCOUNTER — Other Ambulatory Visit: Payer: Self-pay

## 2018-10-28 ENCOUNTER — Ambulatory Visit (INDEPENDENT_AMBULATORY_CARE_PROVIDER_SITE_OTHER): Payer: Medicare Other

## 2018-10-28 ENCOUNTER — Encounter: Payer: Self-pay | Admitting: Internal Medicine

## 2018-10-28 VITALS — BP 132/54 | HR 60 | Temp 98.4°F | Ht 65.0 in | Wt 163.4 lb

## 2018-10-28 DIAGNOSIS — J90 Pleural effusion, not elsewhere classified: Secondary | ICD-10-CM

## 2018-10-28 DIAGNOSIS — R0600 Dyspnea, unspecified: Secondary | ICD-10-CM

## 2018-10-28 DIAGNOSIS — N189 Chronic kidney disease, unspecified: Secondary | ICD-10-CM | POA: Diagnosis not present

## 2018-10-28 DIAGNOSIS — J4520 Mild intermittent asthma, uncomplicated: Secondary | ICD-10-CM

## 2018-10-28 DIAGNOSIS — D631 Anemia in chronic kidney disease: Secondary | ICD-10-CM

## 2018-10-28 DIAGNOSIS — R0609 Other forms of dyspnea: Secondary | ICD-10-CM

## 2018-10-28 DIAGNOSIS — N184 Chronic kidney disease, stage 4 (severe): Secondary | ICD-10-CM

## 2018-10-28 LAB — CBC WITH DIFFERENTIAL/PLATELET
Basophils Absolute: 0.1 10*3/uL (ref 0.0–0.1)
Basophils Relative: 1 % (ref 0.0–3.0)
Eosinophils Absolute: 0.2 10*3/uL (ref 0.0–0.7)
Eosinophils Relative: 2.7 % (ref 0.0–5.0)
HCT: 31 % — ABNORMAL LOW (ref 36.0–46.0)
Hemoglobin: 10.3 g/dL — ABNORMAL LOW (ref 12.0–15.0)
Lymphocytes Relative: 11.3 % — ABNORMAL LOW (ref 12.0–46.0)
Lymphs Abs: 0.7 10*3/uL (ref 0.7–4.0)
MCHC: 33.1 g/dL (ref 30.0–36.0)
MCV: 98.8 fl (ref 78.0–100.0)
Monocytes Absolute: 0.6 10*3/uL (ref 0.1–1.0)
Monocytes Relative: 10.6 % (ref 3.0–12.0)
Neutro Abs: 4.4 10*3/uL (ref 1.4–7.7)
Neutrophils Relative %: 74.4 % (ref 43.0–77.0)
Platelets: 114 10*3/uL — ABNORMAL LOW (ref 150.0–400.0)
RBC: 3.14 Mil/uL — ABNORMAL LOW (ref 3.87–5.11)
RDW: 14 % (ref 11.5–15.5)
WBC: 5.9 10*3/uL (ref 4.0–10.5)

## 2018-10-28 LAB — BASIC METABOLIC PANEL
BUN: 25 mg/dL — ABNORMAL HIGH (ref 6–23)
CO2: 30 mEq/L (ref 19–32)
Calcium: 9 mg/dL (ref 8.4–10.5)
Chloride: 105 mEq/L (ref 96–112)
Creatinine, Ser: 1.93 mg/dL — ABNORMAL HIGH (ref 0.40–1.20)
GFR: 25.44 mL/min — ABNORMAL LOW (ref >60.00)
Glucose, Bld: 116 mg/dL — ABNORMAL HIGH (ref 70–99)
Potassium: 4.3 mEq/L (ref 3.5–5.1)
Sodium: 140 mEq/L (ref 135–145)

## 2018-10-28 LAB — TSH: TSH: 3.05 u[IU]/mL (ref 0.35–4.50)

## 2018-10-28 LAB — BRAIN NATRIURETIC PEPTIDE: Pro B Natriuretic peptide (BNP): 364 pg/mL — ABNORMAL HIGH (ref 0.0–100.0)

## 2018-10-28 LAB — SEDIMENTATION RATE: Sed Rate: 17 mm/hr (ref 0–30)

## 2018-10-28 NOTE — Progress Notes (Signed)
Subjective:    Patient ID: Hannah [name], female    DOB: [birth year] .   MRN: [id number]   ROV 01/23/18 --Mrs. 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  Most recently she has been having issues with syncope, intermittent tachycardia, following w Dr Lovena Le. Her breathing is stable. She uses her Symbicort reliably. She occasionally has chest tightness, relieved by albuterol - uses 2x a month. Her PNA shots are UTD rec Please continue Symbicort 2 puffs twice a day  Keep albuterol available to use 2 puffs if needed for shortness of breath, cough, chest tightness.     Jul 07 2018 GB surgery > 100% recovered    April 6th televisit She has A Fib on Amiodarone .  She complains of 1 week  prior to OV  Started initially with wet cough with clear to white mucus . Has worsened over last week with thick yellow mucus. Cough wears her out. Feels she get short of breath and cant get deep breath in.  Has started using her albuterol neb Twice daily  For last few days. Has been staying inside , trying not to get out in pollen. No watery eyes or nasal drainage.  rec Omnicef 300mg  Twice daily  For 7 days , take with food.  Mucinex DM Twice daily As needed  Cough  Claritin 10mg  daily As needed drainage.  Fluids and rest .  Continue on Symbicort 2 puffs Twice daily  , rinse after use.     April 10 non- cardiac cp rx by Lovena Le d/c florinef, take bid lopressor prn ntg  and w/in 20 min of nitro cp resolved but breathing still short    09/25/2018  ov/Mayco [place] re: consultation re sob/ new R pleural effusion  Chief Complaint  Patient presents with  . Acute Visit    Pt c/o SOB with or without exertion x 3 wks. She is unable to lie down flat due to SOB and sleeps propped up. She has occ tickle in  her throat but not coughing much. She is using her rescue inhaler 1-2 x per day.   acute onset cough productive yellowish x first of April 2020 rx televisit with omnicef as above/ no change maint rx  Cough gradually much  better but not the breathing. Dyspnea:  Comfortable sitting up / room to room at home  Cough: gone / still hoarse/ clearing throat Sleeping: immediate smothering helps to elevate hob about 30 degrees  SABA use: no better with albuterol / last used about 12 h prior to OV   02: none  Has no real change in leg swelling vs baseline rec Symbicort 160 should be Take 2 puffs first thing in am and then another 2 puffs about 12 hours later.  Work on inhaler technique:   Take the lasix 20 mg daily x 3 days then only as needed for swelling  Continue Pantoprazole (protonix) 40 mg   Take  30-60 min before first meal of the day and Pepcid (famotidine)  20 mg one after supper until return to office - this is the best way to tell whether stomach acid is contributing to your problem.  GERD diet - u/s thoracentesis 10/03/2018  x 600 cc transudate cytology >>> neg     10/13/2018  f/u ov/Lizvet Chunn re: f/u  R transudative pleural  effusion  Chief Complaint  Patient presents with  . Follow-up    CXR done today. Pt states her breathing is about the same since the last visit.  She is using her rescue inhaler 2-3 x per day.   p tcentesis x 600 cc x  Better x 2-3 days and could lie flat  now 30 degrees plus pillows Dyspnea:  Sitting still ok/ room to room sob  Cough: none Sleeping: ok as long as elevated  SABA use: as above but not helping  02: none  Does not normally  Have much LE edema  but started early march 2020 and improves but does not resolve with lasix rec  Aldactone 25 mg twice daily automatically and use lasix as needed to keep your legs free of fluid    10/28/2018  f/u ov/Senica Crall re: R transudative effusion ? Etiology  Chief Complaint  Patient presents with  . Follow-up    Breathing has  improved some but not back to her normal baseline.    Dyspnea:  Improved / now able to walk across the parking lot  Cough: none Sleeping: still 30 degrees electric bed - sob if lies flat SABA use: none 02: no   No obvious day to day or daytime variability or assoc excess/ purulent sputum or mucus plugs or hemoptysis or cp or chest tightness, subjective wheeze or overt sinus or hb symptoms.   Sleeping as above without nocturnal  or early am exacerbation  of respiratory  c/o's or need for noct saba. Also denies any obvious fluctuation of symptoms with weather or environmental changes or other aggravating or alleviating factors except as outlined above   No unusual exposure hx or h/o childhood pna/ asthma or knowledge of premature birth.  Current Allergies, Complete Past Medical History, Past Surgical History, Family History, and Social History were reviewed in [facility] record.  ROS  The following are not active complaints unless bolded Hoarseness, sore throat, dysphagia, dental problems, itching, sneezing,  nasal congestion or discharge of excess mucus or purulent secretions, ear ache,   fever, chills, sweats, unintended wt loss or wt gain, classically pleuritic or exertional cp,  orthopnea pnd or arm/hand swelling  or leg swelling improving on aldactone s any lasix/  presyncope, palpitations, abdominal pain, anorexia, nausea, vomiting, diarrhea  or change in bowel habits or change in bladder habits, change in stools or change in urine, dysuria, hematuria,  rash, arthralgias, visual complaints, headache, numbness, weakness or ataxia or problems with walking or coordination,  change in mood or  memory.        Current Meds  Medication Sig  . ACCU-CHEK GUIDE test strip Use to test blood sugar 3 times daily  . acetaminophen (TYLENOL) 325 MG tablet Take 2 tablets (650 mg total) by mouth every 6 (six) hours as needed for mild pain (or Fever >/= 101).  Marland Kitchen albuterol (PROVENTIL  HFA;VENTOLIN HFA) 108 (90 Base) MCG/ACT inhaler Inhale 2 puffs into the lungs every 6 (six) hours as needed for wheezing or shortness of breath.  Marland Kitchen amiodarone (PACERONE) 200 MG tablet Take 200 mg by mouth daily.  . budesonide-formoterol (SYMBICORT) 160-4.5 MCG/ACT inhaler INHALE TWO PUFFS INTO THE LUNGS TWICE DAILY  . ELIQUIS 5 MG TABS tablet TAKE ONE TABLET BY MOUTH TWICE DAILY  . exemestane (AROMASIN) 25 MG tablet TAKE ONE TABLET BY MOUTH DAILY AFTER BREAKFAST  . ezetimibe (ZETIA) 10 MG tablet Take 1 tablet (10 mg total) by mouth daily.  . famotidine (PEPCID)  20 MG tablet One after supper  . furosemide (LASIX) 20 MG tablet Take 1 tablet (20 mg total) by mouth daily as needed for fluid.  Marland Kitchen insulin glargine (LANTUS) 100 UNIT/ML injection Inject 16 units under the skin once daily in the morning.  . insulin lispro (HUMALOG) 100 UNIT/ML injection Inject 4-6 units under the skin three times daily before meals.  . Insulin Pen Needle 32G X 4 MM MISC Use as instructed to inject insulin 4 times daily.  Marland Kitchen loratadine (CLARITIN) 10 MG tablet Take 10 mg by mouth daily as needed for allergies or rhinitis.   . metoprolol tartrate (LOPRESSOR) 25 MG tablet Take 1 tablet (25 mg total) by mouth 2 (two) times daily.  . nitroGLYCERIN (NITROSTAT) 0.4 MG SL tablet Place 1 tablet (0.4 mg total) under the tongue every 5 (five) minutes as needed for chest pain.  . pantoprazole (PROTONIX) 40 MG tablet Take 1 tablet (40 mg total) by mouth daily.  . rosuvastatin (CRESTOR) 40 MG tablet Take 1 tablet (40 mg total) by mouth at bedtime.  Marland Kitchen spironolactone (ALDACTONE) 25 MG tablet Take 1 tablet (25 mg total) by mouth 2 (two) times daily.  Marland Kitchen zolpidem (AMBIEN) 5 MG tablet TAKE ONE TABLET BY MOUTH AT BEDTIME AS NEEDED FOR SLEEP                  Objective:    Pleasant amb wf nad   10/28/2018        163  10/13/2018          175   09/25/18 181 lb 3.2 oz (82.2 kg)  08/15/18 173 lb (78.5 kg)  08/13/18 175 lb 12.8 oz (79.7  kg)      Vital signs reviewed - Note on arrival 02 sats  99% on RA          HEENT: nl dentition, turbinates bilaterally, and oropharynx. Nl external ear canals without cough reflex   NECK :  without JVD/Nodes/TM/ nl carotid upstrokes bilaterally   LUNGS: no acc muscle use,  Nl contour chest which is clear to A and P bilaterally without cough on insp or exp maneuvers   CV:  slt irreg  no s3 with II/VI sem or increase in P2, and trace bilateral ankle edema  ABD:  soft and nontender with nl inspiratory excursion in the supine position. No bruits or organomegaly appreciated, bowel sounds nl  MS:  Nl gait/ ext warm without deformities, calf tenderness, cyanosis or clubbing No obvious joint restrictions   SKIN: warm and dry without lesions    NEURO:  alert, approp, nl sensorium with  no motor or cerebellar deficits apparent.    CXR PA and Lateral:   10/28/2018 :    I personally reviewed images and agree with radiology impression as follows:   Persisting right pleural effusion and associated atelectasis with no overt pulmonary edema. my review:  Very slt improvement R lung aeration overall    Labs ordered/ reviewed:      Chemistry      Component Value Date/Time   NA 140 10/28/2018 1041   NA 143 08/13/2018 1400   NA 143 04/27/2016 1352   K 4.3 10/28/2018 1041   K 4.1 04/27/2016 1352   CL 105 10/28/2018 1041   CO2 30 10/28/2018 1041   CO2 29 04/27/2016 1352   BUN 25 (H) 10/28/2018 1041   BUN 19 08/13/2018 1400   BUN 29.5 (H) 04/27/2016 1352   CREATININE 1.93 (H) 10/28/2018 1041  CREATININE 1.66 (H) 05/23/2016 1143   CREATININE 1.3 (H) 04/27/2016 1352      Component Value Date/Time   CALCIUM 9.0 10/28/2018 1041   CALCIUM 8.9 04/27/2016 1352   ALKPHOS 96 08/13/2018 1400   ALKPHOS 62 04/27/2016 1352   AST 50 (H) 08/13/2018 1400   AST 28 04/27/2016 1352   ALT 38 (H) 08/13/2018 1400   ALT 24 04/27/2016 1352   BILITOT 0.7 08/13/2018 1400   BILITOT 0.81 04/27/2016  1352        Lab Results  Component Value Date   WBC 5.9 10/28/2018   HGB 10.3 (L) 10/28/2018   HCT 31.0 (L) 10/28/2018   MCV 98.8 10/28/2018   PLT 114.0 (L) 10/28/2018      Lab Results  Component Value Date   TSH 3.05 10/28/2018     Lab Results  Component Value Date   PROBNP 364.0 (H) 10/28/2018       PROBNP                                                       406                                    10/28/2018     Lab Results  Component Value Date   ESRSEDRATE 17 10/28/2018   ESRSEDRATE 23 09/25/2018   ESRSEDRATE 14 01/29/2017                     Assessment & Plan:

## 2018-10-28 NOTE — Telephone Encounter (Signed)
S/w pt due to [name]'s concerns.  Pt stated still has fluid but not as bad.  Breathing has improved but is not totally gone.  Bothers pt when pt lays down especially on right side. Pt has no energy. Dr. Melvyn Novas told pt this is due to all the medications pt is on.  Pt has been started on aldactone (25 mg) bid.  Dr.Wert stated and take in conjunction with Lasix if pt needs to. Pt stated has not had any swelling. Pt stated had a CXR today and blood work.  Will see Dr. Melvyn Novas back in 6 weeks. Will send to [place] to [place].

## 2018-10-28 NOTE — Patient Instructions (Signed)
Please remember to go to the lab and x-ray department   for your tests - we will call you with the results when they are available.     Please schedule a follow up office visit in 6 weeks, call sooner if needed  

## 2018-10-29 ENCOUNTER — Other Ambulatory Visit: Payer: Self-pay | Admitting: Internal Medicine

## 2018-10-29 ENCOUNTER — Encounter: Payer: Self-pay | Admitting: Internal Medicine

## 2018-10-29 DIAGNOSIS — N184 Chronic kidney disease, stage 4 (severe): Secondary | ICD-10-CM

## 2018-10-29 NOTE — Assessment & Plan Note (Addendum)
S/p GB surgery  Jul 07 2018  New dx 09/25/2018 assoc with intermediate bnp, mild peripheral edema  - u/s thoracentesis 10/03/2018  x 600 cc transudate cytology >>> neg  - Echo  10/20/2018 >>> 1. The left ventricle has normal systolic function with an ejection fraction of 60-65%. The cavity size was normal. There is mildly increased left ventricular wall thickness. Left ventricular diastolic Doppler parameters are consistent with  pseudonormalization.  2. The right ventricle has normal systolic function. The cavity was mildly enlarged.  3. Left atrial size was moderately dilated.  4. Right atrial size was mildly dilated.  5. The mitral valve is grossly normal. Mild thickening of the mitral valve leaflet. There is mild mitral annular calcification present.  6. The tricuspid valve is grossly normal.  7. There is mild dilatation of the ascending aorta measuring 40 mm.  8. Normal LV systolic function; moderate diastolic dysfunction; mild LVH; mildly dilated ascending aorta; s/p AVR with mean gradient of 18 mmHg and no AI; biatrial enlargement; mild MR; mild RVE.   She is marginally better and as dry as is safe for now with persistent effusion but given CRI rec  hold at present level with aldactone and recheck in 2 weeks bmet if not done in meantime   F/u cxr in 6 weeks - sooner if breathing worsens

## 2018-10-29 NOTE — Progress Notes (Signed)
Spoke with pt and notified of results per Dr. Wert. Pt verbalized understanding and denied any questions. 

## 2018-10-29 NOTE — Assessment & Plan Note (Signed)
Lab Results  Component Value Date   CREATININE 1.93 (H) 10/28/2018   CREATININE 1.57 (H) 09/25/2018   CREATININE 1.40 (H) 08/13/2018   CREATININE 1.66 (H) 05/23/2016   CREATININE 1.38 (H) 05/08/2016   CREATININE 1.3 (H) 04/27/2016   CREATININE 1.4 (H) 04/04/2016   CREATININE 1.1 03/30/2016     On aldactone and euvolemic but concerned may not be able to tolerate this longterm  >>> rec recheck bmet in 2 weeks    I had an extended discussion with the patient reviewing all relevant studies completed to date and  lasting 15 to 20 minutes of a 25 minute visit    Each maintenance medication was reviewed in detail including most importantly the difference between maintenance and prns and under what circumstances the prns are to be triggered using an action plan format that is not reflected in the computer generated alphabetically organized AVS.     Please see AVS for specific instructions unique to this visit that I personally wrote and verbalized to the the pt in detail and then reviewed with pt  by my nurse highlighting any  changes in therapy recommended at today's visit to their plan of care.

## 2018-10-29 NOTE — Assessment & Plan Note (Signed)
10/12/2014 p extensive coaching HFA effectiveness =    75% s spacer     All goals of chronic asthma control met including optimal function and elimination of symptoms with minimal need for rescue therapy.  Contingencies discussed in full including contacting this office immediately if not controlling the symptoms using the rule of two's.

## 2018-10-29 NOTE — Assessment & Plan Note (Signed)
  Lab Results  Component Value Date   HGB 10.3 (L) 10/28/2018   HGB 10.4 (L) 09/25/2018   HGB 10.5 (L) 08/13/2018   HGB 10.7 (L) 07/04/2018   HGB 10.8 (L) 12/23/2017   HGB 9.1 (L) 10/01/2017   HGB 8.5 (L) 04/27/2016   HGB 9.6 (L) 04/04/2016   HGB 7.3 (L) 03/30/2016     Likely related to chronic dz, no change rx needed

## 2018-10-30 NOTE — Telephone Encounter (Signed)
Let's touch base with her next week and see how she is doing - I'm still worried about her - may need to consider repeat thoracentesis.

## 2018-11-03 LAB — CUP PACEART REMOTE DEVICE CHECK
Date Time Interrogation Session: 20200524201031
Implantable Pulse Generator Implant Date: 20190322

## 2018-11-04 ENCOUNTER — Ambulatory Visit (INDEPENDENT_AMBULATORY_CARE_PROVIDER_SITE_OTHER): Payer: Medicare Other | Admitting: *Deleted

## 2018-11-04 DIAGNOSIS — R55 Syncope and collapse: Secondary | ICD-10-CM

## 2018-11-04 DIAGNOSIS — R002 Palpitations: Secondary | ICD-10-CM

## 2018-11-05 NOTE — Telephone Encounter (Signed)
S/w pt is doing a lot better.  Pt is compliant with elevating legs and drinking plenty of water.  Pt's lab showed kidneys were dry pt will have repeat labs in two weeks. Pt stated no swelling in feet and ankles.  Pt stated was taken off of lasix unless needed. Pt sees Dr.Wert at the end of June.  Will send to [place] to [place].

## 2018-11-05 NOTE — Telephone Encounter (Signed)
Noted.   Please tell her to let us know if she needs anything.   Will tentatively see back as planned unless anything changes.   Hannah [name]

## 2018-11-10 ENCOUNTER — Other Ambulatory Visit: Payer: Self-pay | Admitting: *Deleted

## 2018-11-10 NOTE — Telephone Encounter (Signed)
S/w pt is aware of Hannah [name]'s recommendations.   She needs to resume at the 0.1 mg dose daily - continue to monitor BP - also clarify her dose of diuretic. D/C Lasix, Aldactone (25 mg ) bid. Updated medication list.  She also needs to transmit her loop recorder manually.     Can we alert EP/device to see if anything has come thru on her transmissions. Sent to [facility] and [place] with device. Pt's appt has been made.    Virtual Visit Pre-Appointment Phone Call  "(Name), I am calling you today to discuss your upcoming appointment. We are currently trying to limit exposure to the virus that causes COVID-19 by seeing patients at home rather than in the office."  1. "What is the BEST phone number to call the day of the visit?" - include this in appointment notes  2. "Do you have or have access to (through a family member/friend) a smartphone with video capability that we can use for your visit?" a. If yes - list this number in appt notes as "cell" (if different from BEST phone #) and list the appointment type as a VIDEO visit in appointment notes b. If no - list the appointment type as a PHONE visit in appointment notes  3. Confirm consent - "In the setting of the current Covid19 crisis, you are scheduled for a (phone or video) visit with your provider on (June 23) at (10:15 am).  Just as we do with many in-office visits, in order for you to participate in this visit, we must obtain consent.  If you'd like, I can send this to your mychart (if signed up) or email for you to review.  Otherwise, I can obtain your verbal consent now.  All virtual visits are billed to your insurance company just like a normal visit would be.  By agreeing to a virtual visit, we'd like you to understand that the technology does not allow for your provider to perform an examination, and thus may limit your provider's ability to fully assess your condition. If your provider identifies any concerns that need to be evaluated in person, we  will make arrangements to do so.  Finally, though the technology is pretty good, we cannot assure that it will always work on either your or our end, and in the setting of a video visit, we may have to convert it to a phone-only visit.  In either situation, we cannot ensure that we have a secure connection.  Are you willing to proceed?" STAFF: Did the patient verbally acknowledge consent to telehealth visit? Document YES/NO here: YES.  4. Advise patient to be prepared - "Two hours prior to your appointment, go ahead and check your blood pressure, pulse, oxygen saturation, and your weight (if you have the equipment to check those) and write them all down. When your visit starts, your provider will ask you for this information. If you have an Apple Watch or Kardia device, please plan to have heart rate information ready on the day of your appointment. Please have a pen and paper handy nearby the day of the visit as well."  5. Give patient instructions for MyChart download to smartphone OR Doximity/Doxy.me as below if video visit (depending on what platform provider is using)  6. Inform patient they will receive a phone call 15 minutes prior to their appointment time (may be from unknown caller ID) so they should be prepared to answer    TELEPHONE CALL NOTE  Hannah [name] has  been deemed a candidate for a follow-up tele-health visit to limit community exposure during the Covid-19 pandemic. I spoke with the patient via phone to ensure availability of phone/video source, confirm preferred email & phone number, and discuss instructions and expectations.  I reminded Hannah [name] to be prepared with any vital sign and/or heart rhythm information that could potentially be obtained via home monitoring, at the time of her visit. I reminded Hannah [name] to expect a phone call prior to her visit.  Hannah [name] 11/10/2018 1:55 PM   INSTRUCTIONS FOR DOWNLOADING THE MYCHART APP TO SMARTPHONE  -  The patient must first make sure to have activated MyChart and know their login information - If Apple, go to [employer] and type in MyChart in the search bar and download the app. If Android, ask patient to go to [employer] and type in [place] in the search bar and download the app. The app is free but as with any other app downloads, their phone may require them to verify saved payment information or Apple/Android password.  - The patient will need to then log into the app with their MyChart username and password, and select [facility] as their healthcare provider to link the account. When it is time for your visit, go to the MyChart app, find appointments, and click Begin Video Visit. Be sure to Select Allow for your device to access the Microphone and Camera for your visit. You will then be connected, and your provider will be with you shortly.  **If they have any issues connecting, or need assistance please contact MyChart service desk [phone])**  **If using a computer, in order to ensure the best quality for their visit they will need to use either of the following Internet Browsers: [employer], or Google Chrome**  IF USING DOXIMITY or DOXY.ME - The patient will receive a link just prior to their visit by text.     FULL LENGTH CONSENT FOR TELE-HEALTH VISIT   I hereby voluntarily request, consent and authorize [facility] and its employed or contracted physicians, physician assistants, nurse practitioners or other licensed health care professionals (the Practitioner), to provide me with telemedicine health care services (the "Services") as deemed necessary by the treating Practitioner. I acknowledge and consent to receive the Services by the Practitioner via telemedicine. I understand that the telemedicine visit will involve communicating with the Practitioner through live audiovisual communication technology and the disclosure of certain medical information by  electronic transmission. I acknowledge that I have been given the opportunity to request an in-person assessment or other available alternative prior to the telemedicine visit and am voluntarily participating in the telemedicine visit.  I understand that I have the right to withhold or withdraw my consent to the use of telemedicine in the course of my care at any time, without affecting my right to future care or treatment, and that the Practitioner or I may terminate the telemedicine visit at any time. I understand that I have the right to inspect all information obtained and/or recorded in the course of the telemedicine visit and may receive copies of available information for a reasonable fee.  I understand that some of the potential risks of receiving the Services via telemedicine include:  Marland Kitchen Delay or interruption in medical evaluation due to technological equipment failure or disruption; . Information transmitted may not be sufficient (e.g. poor resolution of images) to allow for appropriate medical decision making by the Practitioner; and/or  .  In rare instances, security protocols could fail, causing a breach of personal health information.  Furthermore, I acknowledge that it is my responsibility to provide information about my medical history, conditions and care that is complete and accurate to the best of my ability. I acknowledge that Practitioner's advice, recommendations, and/or decision may be based on factors not within their control, such as incomplete or inaccurate data provided by me or distortions of diagnostic images or specimens that may result from electronic transmissions. I understand that the practice of medicine is not an exact science and that Practitioner makes no warranties or guarantees regarding treatment outcomes. I acknowledge that I will receive a copy of this consent concurrently upon execution via email to the email address I last provided but may also request a printed  copy by calling the office of [facility].    I understand that my insurance will be billed for this visit.   I have read or had this consent read to me. . I understand the contents of this consent, which adequately explains the benefits and risks of the Services being provided via telemedicine.  . I have been provided ample opportunity to ask questions regarding this consent and the Services and have had my questions answered to my satisfaction. . I give my informed consent for the services to be provided through the use of telemedicine in my medical care  By participating in this telemedicine visit I agree to the above.

## 2018-11-11 ENCOUNTER — Other Ambulatory Visit (INDEPENDENT_AMBULATORY_CARE_PROVIDER_SITE_OTHER): Payer: Medicare Other

## 2018-11-11 DIAGNOSIS — N184 Chronic kidney disease, stage 4 (severe): Secondary | ICD-10-CM

## 2018-11-11 LAB — BASIC METABOLIC PANEL
BUN: 34 mg/dL — ABNORMAL HIGH (ref 6–23)
CO2: 27 mEq/L (ref 19–32)
Calcium: 8.8 mg/dL (ref 8.4–10.5)
Chloride: 106 mEq/L (ref 96–112)
Creatinine, Ser: 2.16 mg/dL — ABNORMAL HIGH (ref 0.40–1.20)
GFR: 22.34 mL/min — ABNORMAL LOW (ref >60.00)
Glucose, Bld: 235 mg/dL — ABNORMAL HIGH (ref 70–99)
Potassium: 4.1 mEq/L (ref 3.5–5.1)
Sodium: 138 mEq/L (ref 135–145)

## 2018-11-11 NOTE — Telephone Encounter (Signed)
-----   Message from [name], NP sent at 11/11/2018  1:23 PM EDT ----- Thanks Jonni Sanger.   Hannah [name],  Can you let Keita know the check - we will see back as planned and continue with what we did yesterday.   [name]  ----- Message ----- From: Annamaria Helling Sent: 11/11/2018   1:06 PM EDT To: [name], NP, Tamsen Snider, #  Bary Castilla,   Pt had no episodes on her device check 11/10/2018. No episodes at all since 05/2018. Nothing to correlate with her symptoms.   [name] [address]" [place], PA-C 11/11/2018 1:08 PM  ----- Message ----- From: Tiajuana Amass, CMA Sent: 11/11/2018  12:52 PM EDT To: [name], NP, Tamsen Snider, #  Full report received 11/10/2018. Can someone let [name] know the results?  Thanks  Pamala Hurry ----- Message ----- From: Tamsen Snider Sent: 11/10/2018   1:48 PM EDT To: Royston Cowper [place], CMA, Kem D Kil[place], [facility],  Cant seem to locate the device pool. Please be on the look out for Jaxon Mynhier [id number] manual transmission per [name].    Thanks so much, [employer]

## 2018-11-11 NOTE — Telephone Encounter (Signed)
S/w pt is aware device check is ok.  Pt wanted Hannah [name] to know had labs checked at Dr.Wert's office today. Will send to [place] to [place].

## 2018-11-12 ENCOUNTER — Telehealth: Payer: Self-pay | Admitting: Hematology and Oncology

## 2018-11-12 NOTE — Assessment & Plan Note (Signed)
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 Adjuvant chemotherapy with Taxotere and Cytoxan (3cycles). Adriamycin was not given because of her cardiac problems, Received3 cycles of TC 02/17/16 to 04/06/16 2. Adjuvant radiation 05/24/16 to 07/26/16 3 followed by adjuvant antiestrogen therapy (because a satellite tumor is weakly ER and PR positive). Started 03/15/2018switched to anastrozole 04/04/2017 ---------------------------------------------------------------------------------- Current treatment: Could not tolerate letrozole on anastrozole.  Currently on exemestane 25 mg daily  Exemestane toxicities: Patient has not had any side effects to exemestane.  It does not cost her any more than the other antiestrogen therapies.  Breast cancer surveillance: Mammograms 02/07/2018 07/07/2018: Cholecystectomy Atrial fibrillation: Treated with albuterol Profound shortness of breath: Dr. Melvyn Novas following her.  Restrictive and obstructive lung disease Pleural effusion: Thoracentesis 10/06/2018: No malignant cells identified  Return to clinic in 1 year for surveillance and follow-up

## 2018-11-12 NOTE — Telephone Encounter (Signed)
I did not talk with patient but I did leave a message regarding the link that will be provided for video visit. I also explained that if she would like to change to phone visit that she could just call back.

## 2018-11-13 NOTE — Progress Notes (Signed)
Spoke with pt and notified of results per Dr. Wert. Pt verbalized understanding and denied any questions. 

## 2018-11-17 ENCOUNTER — Other Ambulatory Visit: Payer: Medicare Other

## 2018-11-17 NOTE — Progress Notes (Signed)
[facility] Summary Report / Loop Recorder 

## 2018-11-18 ENCOUNTER — Other Ambulatory Visit (INDEPENDENT_AMBULATORY_CARE_PROVIDER_SITE_OTHER): Payer: Medicare Other

## 2018-11-18 ENCOUNTER — Other Ambulatory Visit: Payer: Self-pay

## 2018-11-18 DIAGNOSIS — Z794 Long term (current) use of insulin: Secondary | ICD-10-CM | POA: Diagnosis not present

## 2018-11-18 DIAGNOSIS — E1165 Type 2 diabetes mellitus with hyperglycemia: Secondary | ICD-10-CM

## 2018-11-18 LAB — COMPREHENSIVE METABOLIC PANEL
ALT: 40 U/L — ABNORMAL HIGH (ref 0–35)
AST: 59 U/L — ABNORMAL HIGH (ref 0–37)
Albumin: 3.1 g/dL — ABNORMAL LOW (ref 3.5–5.2)
Alkaline Phosphatase: 62 U/L (ref 39–117)
BUN: 30 mg/dL — ABNORMAL HIGH (ref 6–23)
CO2: 27 mEq/L (ref 19–32)
Calcium: 8.8 mg/dL (ref 8.4–10.5)
Chloride: 107 mEq/L (ref 96–112)
Creatinine, Ser: 2.13 mg/dL — ABNORMAL HIGH (ref 0.40–1.20)
GFR: 22.7 mL/min — ABNORMAL LOW (ref >60.00)
Glucose, Bld: 139 mg/dL — ABNORMAL HIGH (ref 70–99)
Potassium: 4.3 mEq/L (ref 3.5–5.1)
Sodium: 139 mEq/L (ref 135–145)
Total Bilirubin: 0.7 mg/dL (ref 0.2–1.2)
Total Protein: 6.3 g/dL (ref 6.0–8.3)

## 2018-11-18 LAB — HEMOGLOBIN A1C: Hgb A1c MFr Bld: 6.8 % — ABNORMAL HIGH (ref 4.6–6.5)

## 2018-11-20 ENCOUNTER — Telehealth: Payer: Self-pay | Admitting: Hematology and Oncology

## 2018-11-20 NOTE — Progress Notes (Signed)
HEMATOLOGY-ONCOLOGY DOXIMITY VISIT PROGRESS NOTE  I connected with Hannah [name] on 11/21/2018 at 11:00 AM EDT by Doximity video conference and verified that I am speaking with the correct person using two identifiers.  I discussed the limitations, risks, security and privacy concerns of performing an evaluation and management service by Doximity and the availability of in person appointments.  I also discussed with the patient that there may be a patient responsible charge related to this service. The patient expressed understanding and agreed to proceed.  Patient's Location: Home Physician Location: Clinic  CHIEF COMPLIANT: Follow-up on exemestane therapy  INTERVAL HISTORY: Hannah [name] is a 73 y.o. female with above-mentioned history of right breast cancer treated with lumpectomy, adjuvant chemotherapy, radiation, and who is currently on antiestrogen therapy with exemestane. I last saw her a year ago. Her most recent mammogram on 02/07/18 showed no evidence of malignancy. She presents over Doximity today for annual follow-up.  Gall bladder removed Jan 2020 Episodes of syncope due to BP issues  Oncology History  Breast cancer of upper-inner quadrant of right female breast ([place])  12/14/2015 Initial Diagnosis   Right breast biopsy 12:30 position: 2 masses, 1.6 cm mass: Invasive ductal carcinoma, grade 2, ER 0%, PR 0%, HER-2 negative, Ki-67 70%; satellite mass 8 mm: IDC grade 2 ER 5%, PR 5%, HER-2 negative, Ki-67 50%; T1cN0 stage IA clinical stage   01/18/2016 Surgery   Right lumpectomy: Multifocal IDC grade 3, 1.9 cm  ER 0%, PR 0%, HER-2 negative, Ki-67 70% and 0.8 cm satellite mass ER 5%, PR 2%, HER-2 negative, Ki-67 50%, high-grade DCIS, margins negative, 0/1 lymph nodes negative T1 cN0 stage IA   02/17/2016 - 04/06/2016 Chemotherapy   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    04/08/2016 - 04/23/2016 Hospital Admission   Hosp adm for cellulitis    05/24/2016 - 07/25/2016 Radiation Therapy    Adj XRT   06/29/2016 - 07/04/2016 Hospital Admission   Seizure like activity, MRI brain and EEG unremarkable    08/16/2016 -  Anti-estrogen oral therapy   Letrozole could not tolerate it due to dizziness and lightheadedness, switched to anastrozole 04/04/2017 switched to exemestane 05/22/2017     REVIEW OF SYSTEMS:   Constitutional: Denies fevers, chills or abnormal weight loss Eyes: Denies blurriness of vision Ears, nose, mouth, throat, and face: Denies mucositis or sore throat Respiratory: SoB recent thoracentesis Cardiovascular: Occ palpitation, denies chest discomfort Gastrointestinal:  Denies nausea, heartburn or change in bowel habits Skin: Denies abnormal skin rashes Lymphatics: Denies new lymphadenopathy or easy bruising Neurological:Denies numbness, tingling or new weaknesses Behavioral/Psych: Mood is stable, no new changes  Extremities: No lower extremity edema Breast: denies any pain or lumps or nodules in either breasts All other systems were reviewed with the patient and are negative.  Observations/Objective:  There were no vitals filed for this visit. There is no height or weight on file to calculate BMI.  I have reviewed the data as listed CMP Latest Ref Rng & Units 11/18/2018 11/11/2018 10/28/2018  Glucose 70 - 99 mg/dL 139(H) 235(H) 116(H)  BUN 6 - 23 mg/dL 30(H) 34(H) 25(H)  Creatinine 0.40 - 1.20 mg/dL 2.13(H) 2.16(H) 1.93(H)  Sodium 135 - 145 mEq/L 139 138 140  Potassium 3.5 - 5.1 mEq/L 4.3 4.1 4.3  Chloride 96 - 112 mEq/L 107 106 105  CO2 19 - 32 mEq/L _0 Calcium 8.4 - 10.5 mg/dL 8.8 8.8 9.0  Total Protein 6.0 -  8.3 g/dL 6.3 - -  Total Bilirubin 0.2 - 1.2 mg/dL 0.7 - -  Alkaline Phos 39 - 117 U/L 62 - -  AST 0 - 37 U/L 59(H) - -  ALT 0 - 35 U/L 40(H) - -    Lab Results  Component Value Date   WBC 5.9 10/28/2018   HGB 10.3 (L) 10/28/2018   HCT 31.0 (L) 10/28/2018   MCV 98.8 10/28/2018   PLT 114.0  (L) 10/28/2018   NEUTROABS 4.4 10/28/2018      Assessment Plan:  Breast cancer of upper-inner quadrant of right female breast ([facility]) Right lumpectomy 01/18/2016: Multifocal IDC grade 3, 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 Adjuvant chemotherapy with Taxotere and Cytoxan (3cycles). Adriamycin was not given because of her cardiac problems, Received3 cycles of TC 02/17/16 to 04/06/16 2. Adjuvant radiation 05/24/16 to 07/26/16 3 followed by adjuvant antiestrogen therapy (because a satellite tumor is weakly ER and PR positive). Started 03/15/2018switched to anastrozole 04/04/2017 ---------------------------------------------------------------------------------- Current treatment: Could not tolerate letrozole on anastrozole.  Currently on exemestane 25 mg daily  Exemestane toxicities: Patient has not had any side effects to exemestane.  It does not cost her any more than the other antiestrogen therapies.  Breast cancer surveillance: Mammograms 02/07/2018: Benign  07/07/2018: Cholecystectomy Atrial fibrillation: controled on metoprolol, ECHO was ok.  Profound shortness of breath: Dr. Melvyn Novas following her.  Restrictive and obstructive lung disease Pleural effusion: Right Thoracentesis 10/06/2018: No malignant cells identified  Return to clinic in 1 year for surveillance and follow-up  I discussed the assessment and treatment plan with the patient. The patient was provided an opportunity to ask questions and all were answered. The patient agreed with the plan and demonstrated an understanding of the instructions. The patient was advised to call back or seek an in-person evaluation if the symptoms worsen or if the condition fails to improve as anticipated.   Haven't  been exercising due to COVID 19  I provided 15 minutes of face-to-face Doximity time during this encounter.    Rulon Eisenmenger, MD 11/21/2018   I, Molly Dorshimer, am acting as scribe for Nicholas Lose, MD.  I have reviewed the above documentation for accuracy and completeness, and I agree with the above.

## 2018-11-21 ENCOUNTER — Ambulatory Visit (INDEPENDENT_AMBULATORY_CARE_PROVIDER_SITE_OTHER): Payer: Medicare Other | Admitting: Endocrinology

## 2018-11-21 ENCOUNTER — Other Ambulatory Visit: Payer: Self-pay

## 2018-11-21 ENCOUNTER — Inpatient Hospital Stay: Payer: Medicare Other | Attending: Hematology and Oncology | Admitting: Hematology and Oncology

## 2018-11-21 ENCOUNTER — Encounter: Payer: Self-pay | Admitting: Endocrinology

## 2018-11-21 DIAGNOSIS — C50211 Malignant neoplasm of upper-inner quadrant of right female breast: Secondary | ICD-10-CM | POA: Diagnosis not present

## 2018-11-21 DIAGNOSIS — Z170 Estrogen receptor positive status [ER+]: Secondary | ICD-10-CM | POA: Diagnosis not present

## 2018-11-21 DIAGNOSIS — Z79811 Long term (current) use of aromatase inhibitors: Secondary | ICD-10-CM

## 2018-11-21 DIAGNOSIS — Z9221 Personal history of antineoplastic chemotherapy: Secondary | ICD-10-CM

## 2018-11-21 DIAGNOSIS — Z923 Personal history of irradiation: Secondary | ICD-10-CM

## 2018-11-21 DIAGNOSIS — Z794 Long term (current) use of insulin: Secondary | ICD-10-CM

## 2018-11-21 DIAGNOSIS — E1165 Type 2 diabetes mellitus with hyperglycemia: Secondary | ICD-10-CM | POA: Diagnosis not present

## 2018-11-21 DIAGNOSIS — N289 Disorder of kidney and ureter, unspecified: Secondary | ICD-10-CM | POA: Diagnosis not present

## 2018-11-21 NOTE — Progress Notes (Addendum)
Patient ID: Hannah [name], female   DOB: 1945/11/03, 73 y.o.   MRN: [id number]           Reason for Appointment: Follow-up for Type 2 Diabetes  Today's office visit was provided via telemedicine using a telephone call to the patient Patient has been explained the limitations of evaluation and management by telemedicine and the availability of in person appointments.  The patient understood the limitations and agreed to proceed. Patient also understood that the telehealth visit is billable. . Location of the patient: Home . Location of the provider: Office Only the patient and myself were participating in the encounter  History of Present Illness:          Date of diagnosis of type 2 diabetes mellitus: Late 1980s        Background history:  She was previously treated with metformin prior to starting insulin She thinks she has been on insulin for at least 10 years and mostly taking Lantus and other types of insulin with it Her metformin was stopped when she started insulin and also has had renal dysfunction Previously her A1c had been consistently over 8% and occasionally higher.  Recent history:   INSULIN regimen is described as:  Lantus 18 units daily, Humalog 4-6 units before meals  Non-insulin hypoglycemic drugs the patient is taking are: None, previously was on Trulicity 1.5 mg weekly  She had been on the V-go pump since 03/2015, this was stopped in 3/20  Most recent A1c is 6.8 Previous A1c was 6.1   Current diabetes management, blood sugar patterns and problems identified:   She is now using Lantus and Humalog  Also because of GI problems and cost she is not taking Trulicity  Although her blood sugars are on an average similar to her last visit her blood sugars are relatively higher fasting and before dinnertime  Previously was having low normal or low sugars fasting with the V-go pump  Lantus dose has been continued at the starting regimen of 16 units  Although  her fasting readings are mildly increased at times they have been as low as 83 and once during the night her blood sugar was 69  Taking Lantus in the morning consistently around the same time  She also is consistent with taking her Humalog before the meals  She was not aware of how to adjust her Humalog based on carbohydrate content of her meals  Is concerned about her sugars being periodically higher after lunch but her meal content is quite variable and sometimes will have soup and crackers and other times will last sandwiches and fruit  Her evening meal is relatively large  She was previously more active with going to the gym but now is only doing a little walking  Because of other general medical problems she appears to have lost weight in the last couple of months  Blood sugar analysis as below:  Side effects from medications have been: None  Compliance with the medical regimen: Fair  Glucose monitoring:  done about 2 + times a day         Glucometer:  Accu-Chek [facility] Blood Glucose readings by download for the last 4 weeks:   PRE-MEAL Fasting Lunch Dinner Bedtime Overall  Glucose range: 83-142    128-192   Mean/median:  116    160 142   POST-MEAL PC Breakfast PC Lunch PC Dinner  Glucose range:   166-329  104-162  Mean/median:   160  150  Previous readings:  PRE-MEAL Fasting Lunch Dinner Bedtime Overall  Glucose range:  62-156    78-253   Mean/median:  90   174  173  142   POST-MEAL PC Breakfast PC Lunch PC Dinner  Glucose range:     Mean/median:   162     Self-care: The diet that the patient has been following is: tries to limit fats and carbs.  She uses diet green tea or diet drinks and no sugar in drinks    Lunch 1-2 pm Dinner 6-7 PM  Typical meal intake: Breakfast is oatrmeal or cheese toast              Dietician visit, most recent: 5/16 And in 7/16 with oncology dietitian    Weight history:  Wt Readings from Last 3 Encounters:  10/28/18 163 lb 6.4  oz (74.1 kg)  10/13/18 175 lb (79.4 kg)  09/25/18 181 lb 3.2 oz (82.2 kg)    Glycemic control:      Lab Results  Component Value Date   HGBA1C 6.8 (H) 11/18/2018   HGBA1C 6.1 08/13/2018   HGBA1C 6.5 (H) 07/04/2018   Lab Results  Component Value Date   MICROALBUR 53.4 (H) 05/12/2018   LDLCALC 59 05/12/2018   CREATININE 2.13 (H) 11/18/2018       Allergies as of 11/21/2018      Reactions   Amoxicillin Rash, Other (See Comments)   Tolerates Cephalosporins Has patient had a PCN reaction causing immediate rash, facial/tongue/throat swelling, SOB or lightheadedness with hypotension: No Has patient had a PCN reaction causing severe rash involving mucus membranes or skin necrosis: Yes Has patient had a PCN reaction that required hospitalization No Has patient had a PCN reaction occurring within the last 10 years: No If all of the above answers are "NO", then may proceed with Cephalosporin use.   Imdur [isosorbide Dinitrate] Other (See Comments)   Headache/severe hypotension/Syncope   Tape Other (See Comments)   Must use paper tape   Arimidex [anastrozole] Nausea Only   Latex Itching, Other (See Comments)   Dentist office only   Tetracycline Rash      Medication List       Accurate as of November 21, 2018  1:33 PM. If you have any questions, ask your nurse or doctor.        Accu-Chek Guide test strip Generic drug: glucose blood Use to test blood sugar 3 times daily   acetaminophen 325 MG tablet Commonly known as: TYLENOL Take 2 tablets (650 mg total) by mouth every 6 (six) hours as needed for mild pain (or Fever >/= 101).   albuterol 108 (90 Base) MCG/ACT inhaler Commonly known as: VENTOLIN HFA Inhale 2 puffs into the lungs every 6 (six) hours as needed for wheezing or shortness of breath.   amiodarone 200 MG tablet Commonly known as: PACERONE Take 200 mg by mouth daily.   budesonide-formoterol 160-4.5 MCG/ACT inhaler Commonly known as: Symbicort INHALE TWO PUFFS  INTO THE LUNGS TWICE DAILY   Eliquis 5 MG Tabs tablet Generic drug: apixaban TAKE ONE TABLET BY MOUTH TWICE DAILY   exemestane 25 MG tablet Commonly known as: AROMASIN TAKE ONE TABLET BY MOUTH DAILY AFTER BREAKFAST   ezetimibe 10 MG tablet Commonly known as: ZETIA TAKE ONE TABLET BY MOUTH EVERY DAY   famotidine 20 MG tablet Commonly known as: Pepcid One after supper   insulin glargine 100 UNIT/ML injection Commonly known as: LANTUS Inject 16 units under the skin once daily in  the morning.   insulin lispro 100 UNIT/ML injection Commonly known as: HUMALOG Inject 4-6 units under the skin three times daily before meals.   Insulin Pen Needle 32G X 4 MM Misc Use as instructed to inject insulin 4 times daily.   loratadine 10 MG tablet Commonly known as: CLARITIN Take 10 mg by mouth daily as needed for allergies or rhinitis.   metoprolol tartrate 25 MG tablet Commonly known as: LOPRESSOR Take 1 tablet (25 mg total) by mouth 2 (two) times daily.   nitroGLYCERIN 0.4 MG SL tablet Commonly known as: NITROSTAT Place 1 tablet (0.4 mg total) under the tongue every 5 (five) minutes as needed for chest pain.   pantoprazole 40 MG tablet Commonly known as: Protonix Take 1 tablet (40 mg total) by mouth daily.   rosuvastatin 40 MG tablet Commonly known as: CRESTOR Take 1 tablet (40 mg total) by mouth at bedtime.   spironolactone 25 MG tablet Commonly known as: Aldactone Take 1 tablet (25 mg total) by mouth 2 (two) times daily.   zolpidem 5 MG tablet Commonly known as: AMBIEN TAKE ONE TABLET BY MOUTH AT BEDTIME AS NEEDED FOR SLEEP       Allergies:  Allergies  Allergen Reactions  . Amoxicillin Rash and Other (See Comments)    Tolerates Cephalosporins Has patient had a PCN reaction causing immediate rash, facial/tongue/throat swelling, SOB or lightheadedness with hypotension: No Has patient had a PCN reaction causing severe rash involving mucus membranes or skin necrosis:  Yes Has patient had a PCN reaction that required hospitalization No Has patient had a PCN reaction occurring within the last 10 years: No If all of the above answers are "NO", then may proceed with Cephalosporin use.   . Imdur [Isosorbide Dinitrate] Other (See Comments)    Headache/severe hypotension/Syncope  . Tape Other (See Comments)    Must use paper tape  . Arimidex [Anastrozole] Nausea Only  . Latex Itching and Other (See Comments)    Dentist office only  . Tetracycline Rash    Past Medical History:  Diagnosis Date  . Abnormally small mouth   . Acute on chronic combined systolic and diastolic congestive heart failure ([place]) 07/17/2013  . Adrenal insufficiency ([place]) 07/03/2016  . AKI (acute kidney injury) ([place]) 09/01/2017  . Allergic rhinitis 10/14/2009   Qualifier: Diagnosis of  By: Lamonte Sakai MD, Rose Fillers   Overview:  Overview:  Qualifier: Diagnosis of  By: Lamonte Sakai MD, Rose Fillers  Last Assessment & Plan:  Please continue Xyzal and Nasacort as you have been using them  . Anemia   . Asthma 05/12/2009   10/12/2014 p extensive coaching HFA effectiveness =    75% s spacer    Overview:  Overview:  10/12/2014 p extensive coaching HFA effectiveness =    75% s spacer   Last Assessment & Plan:  Please continue Symbicort 2 puffs twice a day. Remember to rinse and gargle after taking this medication. Take albuterol 2 puffs up to every 4 hours if needed for shortness of breath.  Follow with Dr Lamonte Sakai in 6 month  . Atrial tachycardia ([place]) 04/03/2013   Overview:  Last Assessment & Plan:  I have discussed the likely benign nature of her problem. She has very minimal palpitations. I doubt atrial flutter. I would not anti-coagulate. Her monitor demonstrates that her episodes are short lived. Will follow.   . Bilateral carotid artery stenosis    Bilateral ICA 40-59%/  >50% LECA  . Breast cancer ([place]) 01/18/2016  right breast  . Breast cancer of upper-inner quadrant of right female breast ([place]) 12/16/2015  . CAD  (coronary artery disease) cardiologist-  dr Aundra Dubin   Remote CABG in 1998 with redo in 2009  . Chemotherapy induced nausea and vomiting 02/24/2016  . Chemotherapy-induced peripheral neuropathy ([place]) 04/27/2016  . 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 CHF (congestive heart failure) (H[facility])    EF is low normal at 50 to 55% per echo in Jan. 2011  . Chronic coronary artery disease 01/24/2011   Overview:  Last Assessment & Plan:  Stable with no ischemic symptoms.  Continue ASA, statin, ACEI, beta blocker.   . Chronic diastolic CHF (congestive heart failure) ([place]) 09/24/2013  . CKD (chronic kidney disease), stage IV ([place]) 09/24/2013  . Closed fracture of head of left humerus 09/09/2017  . Complication of anesthesia   . Essential hypertension 05/12/2009   Qualifier: Diagnosis of  By: Lamonte Sakai MD, Rose Fillers   . Gallstones   . GERD (gastroesophageal reflux disease)   . Glaucoma   . H/O atrial tachycardia 05/12/2009   Qualifier: History of  By: Lamonte Sakai MD, Rose Fillers   Overview:  Overview:  Qualifier: History of  By: Lamonte Sakai MD, Rose Fillers  Last Assessment & Plan:  There was no evidence of this on her cardiac monitor. I would recommend watchful waiting.   Marland Kitchen Hearing loss of right ear   . History of aortic valve replacement 10/27/2010   Overview:  Last Assessment & Plan:  Will get echo to reassess bioprosthetic aortic valve.   Marland Kitchen History of non-ST elevation myocardial infarction (NSTEMI)    Sept 2014--  thought to be type II HTN w/ LHC without infarct related artery and patent grafts  . History of radiation therapy 05/24/16-07/26/16   right breast 50.4 Gy in 28 fractions, right breast boost 10 Gy in 5 fractions  . Hyperlipidemia   . Hypertension    Resolved.  . Iron deficiency anemia   . Mania ([place]) 03/05/2016  . Moderate persistent asthma    pulmologist-  Dr. Malvin Johns  . Nonischemic cardiomyopathy ([place])   . Osteomyelitis of toe of left foot ([place]) 04/06/2016  . Osteopenia of multiple sites 10/19/2015  . PAF  (paroxysmal atrial fibrillation) ([place])   . Personal history of chemotherapy 2017  . Personal history of radiation therapy 2017  . PONV (postoperative nausea and vomiting)   . Port catheter in place 02/17/2016  . Psoriasis    right leg  . Renal calculus, right   . Renal insufficiency, mild   . S/P AVR    prosthesis valve placement 2009 at same time re-do CABG  . Seizure ([place]) 06/29/2016  . Seizure-like activity ([place]) 09/01/2017  . Sensorineural hearing loss (SNHL) of both ears 01/06/2016  . Stroke [facility])    residual rt hearing loss  . Syncopal episodes 05/21/2018  . Syncope 09/06/2017  . Type 2 diabetes mellitus ([place])    monitored by dr Dwyane Dee    Past Surgical History:  Procedure Laterality Date  . AORTIC VALVE REPLACEMENT  2009   #41mm [facility] Ease pericardial valve (done same time is CABG)  . BREAST LUMPECTOMY Right 01/18/2016  . BREAST LUMPECTOMY WITH RADIOACTIVE SEED AND SENTINEL LYMPH NODE BIOPSY Right 01/18/2016   Procedure: RIGHT BREAST LUMPECTOMY WITH RADIOACTIVE SEED AND SENTINEL LYMPH NODE BIOPSY;  Surgeon: Alphonsa Overall, MD;  Location: [facility];  Service: General;  Laterality: Right;  . CARDIAC CATHETERIZATION  03/23/2008   Pre-redo CABG: L main OK, LAD (T), CFX (  T), OM1 99%, RCA (T), LIMA-LAD OK, SVG-OM(?3) OK w/ little florw to OM2, SVG-RCA OK. EF NL  . CARPAL TUNNEL RELEASE    . CHOLECYSTECTOMY N/A 07/07/2018   Procedure: LAPAROSCOPIC CHOLECYSTECTOMY WITH INTRAOPERATIVE CHOLANGIOGRAM ERAS PATHWAY;  Surgeon: Alphonsa Overall, MD;  Location: [facility];  Service: General;  Laterality: N/A;  . COLONOSCOPY     around 2015. Possibly with [facility]   . CORONARY ARTERY BYPASS GRAFT  1998 &  re-do 2009   Had LIMA to DX/LAD, SVG to 2 marginal branches and SVG to [facility] originally; SVG to 3rd OM and PD at time of redo  . CYSTOSCOPY W/ URETERAL STENT PLACEMENT Right 12/20/2014   Procedure: CYSTOSCOPY WITH RETROGRADE PYELOGRAM/URETERAL STENT PLACEMENT;  Surgeon: Cleon Gustin, MD;  Location:  [facility];  Service: Urology;  Laterality: Right;  . ESOPHAGOGASTRODUODENOSCOPY     many years ago per patient   . ESOPHAGOGASTRODUODENOSCOPY ENDOSCOPY  06/17/2018  . EYE SURGERY Bilateral    cataracts  . HOLMIUM LASER APPLICATION Right 3/61/4431   Procedure:  HOLMIUM LASER LITHOTRIPSY;  Surgeon: Cleon Gustin, MD;  Location[facility];  Service: Urology;  Laterality: Right;  . IR THORACENTESIS ASP PLEURAL SPACE W/IMG GUIDE  10/03/2018  . LEFT HEART CATHETERIZATION WITH CORONARY/GRAFT ANGIOGRAM N/A 02/23/2013   Procedure: LEFT HEART CATHETERIZATION WITH Beatrix Fetters;  Surgeon: Blane Ohara, MD;  Location: [facility] CATH LAB;  Service: Cardiovascular;  Laterality: N/A;  . LOOP RECORDER INSERTION N/A 08/30/2017   Procedure: LOOP RECORDER INSERTION;  Surgeon: Evans Lance, MD;  Location: [facility] CV LAB;  Service: Cardiovascular;  Laterality: N/A;  . PORTACATH PLACEMENT Left 01/18/2016   Procedure: INSERTION PORT-A-CATH;  Surgeon: Alphonsa Overall, MD;  Location: [facility];  Service: General;  Laterality: Left;  . portacath removal    . TONSILLECTOMY    . TRANSTHORACIC ECHOCARDIOGRAM  02-24-2013      mild LVH,  ef 50-55%/  AV bioprosthesis was present with very mild stenosis and no regurg., mean grandient 35mmHg, peak grandient 61mmHg /  mild MR/  mild LAE and RAE/  moderate TR  . TUBAL LIGATION      Family History  Problem Relation Age of Onset  . Heart disease Father   . Heart failure Father   . Diabetes Maternal Grandmother   . Heart disease Maternal Grandmother   . Diabetes Son   . Healthy Brother        #1  . Heart attack Brother        #2  . Heart disease Brother        #2  . Colon cancer Neg Hx   . Esophageal cancer Neg Hx     Social History:  reports that she has never smoked. She has never used smokeless tobacco. She reports that she does not drink alcohol or use drugs.    Review of Systems    Lipid history: On 40 mg Crestor  along with Zetia      Lab Results  Component Value Date   CHOL 110 05/12/2018   HDL 33.70 (L) 05/12/2018   LDLCALC 59 05/12/2018   LDLDIRECT 53.0 10/08/2016   TRIG 82.0 05/12/2018   CHOLHDL 3 05/12/2018           Eyes:  No history of retinopathy  She is followed by nephrologist for renal dysfunction Creatinine appears to be relatively high   Lab Results  Component Value Date   CREATININE 2.13 (H) 11/18/2018  CREATININE 2.16 (H) 11/11/2018   CREATININE 1.93 (H) 10/28/2018    Diabetic foot exam in 3/20: She has significant loss of monofilament sensation in her distal feet She thinks her neuropathy started after her chemotherapy Also has some difficulty with balance    BLOOD pressure: She monitors at home Bp at home 126/50 Cardiologist is treating with Florinef for history of syncope  BP Readings from Last 3 Encounters:  10/28/18 (!) 132/54  10/13/18 (!) 130/58  09/25/18 118/64     Has hypokalemia, followed by cardiologist  Lab Results  Component Value Date   K 4.3 11/18/2018     Physical Examination:  There were no vitals taken for this visit.    ASSESSMENT:  Diabetes type 2, on insulin pump    See history of present illness for detailed discussion of  current management, blood sugar patterns and problems identified  Her A1c is relatively higher at 6.8, previously at 6.1  She is is having somewhat higher postprandial readings with leaving off Trulicity and not using the V-go pump However she is not getting the overnight hypoglycemia with using 16 units of Lantus compared to the pump Her blood sugar patterns, adjustment of mealtime insulin, day-to-day problems with management and blood sugar targets were discussed in detail She is still able to get by without Trulicity and not requiring a lot of insulin Most likely she needs to optimize her mealtime dose especially at lunchtime based on how many carbohydrates she is getting and this was discussed   RENAL dysfunction: Appears relatively worse followed by nephrologist  Hypertension: Blood pressure is excellent at home    PLAN:     She can continue continue her regimen of insulin alone with Lantus and Humalog  Blood sugars are higher in the afternoon depending on her lunch  This is likely to be from not taking Trulicity and she can go up to 8 units before lunch especially when she is eating sandwiches compared to soup and salad type of meals  She does not need to take any Humalog at bedtime since this may tend to cause low sugars during the night  No change in Lantus at this time If she qualifies for the freestyle libre on the next visit we will discuss this further  Will forward results of her labs to nephrologist and cardiologist to assess her renal dysfunction and adjust her medications accordingly   There are no Patient Instructions on file for this visit.     Duration of telephone encounter =12 minutes  Elayne Snare 11/21/2018, 1:33 PM   Note: This office note was prepared with Dragon voice recognition system technology. Any transcriptional errors that result from this process are unintentional.

## 2018-11-27 ENCOUNTER — Telehealth: Payer: Self-pay | Admitting: *Deleted

## 2018-11-27 NOTE — Telephone Encounter (Signed)

## 2018-11-28 ENCOUNTER — Other Ambulatory Visit: Payer: Self-pay | Admitting: Emergency Medicine

## 2018-12-01 NOTE — Progress Notes (Signed)
CARDIOLOGY OFFICE NOTE  Date:  12/02/2018    Hannah [name] Date of Birth: 04-05-1946 Medical Record #[id number]  PCP:  Mackie Pai, PA-C  Cardiologist:  Atilano Median    Chief Complaint  Patient presents with   Follow-up    Seen for Dr. Lovena Le    History of Present Illness: Hannah [name] is a 73 y.o. female who presents today for a follow up visit.  She isa former patient of Dr. Susa Simmonds. Seen for Dr. Aundra Dubin. She now follows with meand Dr. Lovena Le.   She has a history of CAD s/p CABG, bioprosthetic AVR, CKD,&paroxysmal atrial fibrillation. She had her initial CABG in 1998, then had redo CABG in 2009 with bioprosthetic AVR. She does not remember when she last had documented atrial fibrillation, but she thinks it was a number of years ago, soundedperi-operative around the time of her redo CABG. She was never put on anticoagulation other than Plavixbut had been on dronedaronein the past.   Over the years she has had intermittent issues. These have included tachypalpitations and chest pain. Last cath in 2014 - grafts patent. Myoview in 2015 without ischemia.Event monitor in 8/16 showed atrial tachycardia versus atypical atrial flutter. Toprol XL was increased.She saw Dr Lovena Le who thought that she had short runs of atrial tachycardia and not atrial flutter. Therefore, she hadnot been anticoagulated until here recently.   Wasdiagnosed with breast cancerin 2017/2018.She hadsurgery.Gotchemo/radiation - no Adriamycin due to her history of CHF.Her course was complicated by volume overload and anemia.  In January of 2018began to haveorthostasis. She was placed on midodrine and her lasix was changed to as needed only.ACE and beta blocker stopped.Ended up getting on amiodarone in October of 2018 for palpitations/atrial arrhythmias. I saw her in December - she was doing ok - BP started tracking way back up with the Midodrine and we adjusted her  medicines. She had a spell of syncope earlier in the year in 2019and fell - broke her shoulder and sustained a large hematoma. She now has a loop in place.   She was admitted twice in [facility] 2019- recurrent syncope - her loop has been ok. EEG without seizure noted.She wasplaced on Florinef. She was taken off her Ultram and Gabapentin. She was not using Lasix.Found to have some PAF this past summer - placed on Eliquis and her aspirin/Plavix were stopped. She did have the return of palpitations - ended up seeing her with Dr. Lovena Le and we opted to increase her amiodarone to BID dosing short term - this helped.  She was in the ER towards the end of the summer - volume overload - treated with IV lasix. I then saw her and she was doing better. Now using just prn Lasix to avoid orthostasis/syncope. We got her echo updated -has grade 2 DD and EF is still ok.Plan was to cut amiodarone back to QD dosing as of October 1.Last seen by me back in March - she had gotten some iron infusions and had her gallbladder out in the interim - did well.   Last saw Dr. Lovena Le in April - had had some angina - beta blocker was increased - BP was high so Florinef was cut back. Since that visit she has had more issues with DOE/found to have pleural effusion - had thoracentesis. Echo has been updated - back on diuretics - to include Aldactone - got orthostatic again - I have placed her back on Florinef.   The patient does not have  symptoms concerning for COVID-19 infection (fever, chills, cough, or new shortness of breath).   Comes in today. Here alone. She looks poorly. She admits she does not feel well. Has profound weakness. Significant weight loss. Short of breath with minimal activity. No chest pain. Palpitations fairly stable. Worsening kidney function noted. Still on Aldactone. Not using any Lasix. Having PFTs and a visit with Dr. Lamonte Sakai next week - she is for COVID testing on Thursday. She "feels like I've aged  7 years". She has gotten more fatigued - not really doing much. Family is worried. She is worried. No cough. Not really hungry since her gallbladder surgery.   Past Medical History:  Diagnosis Date   Abnormally small mouth    Acute on chronic combined systolic and diastolic congestive heart failure ([place]) 07/17/2013   Adrenal insufficiency ([place]) 07/03/2016   AKI (acute kidney injury) ([place]) 09/01/2017   Allergic rhinitis 10/14/2009   Qualifier: Diagnosis of  By: Lamonte Sakai MD, Rose Fillers   Overview:  Overview:  Qualifier: Diagnosis of  By: Lamonte Sakai MD, Rose Fillers  Last Assessment & Plan:  Please continue Xyzal and Nasacort as you have been using them   Anemia    Asthma 05/12/2009   10/12/2014 p extensive coaching HFA effectiveness =    75% s spacer    Overview:  Overview:  10/12/2014 p extensive coaching HFA effectiveness =    75% s spacer   Last Assessment & Plan:  Please continue Symbicort 2 puffs twice a day. Remember to rinse and gargle after taking this medication. Take albuterol 2 puffs up to every 4 hours if needed for shortness of breath.  Follow with Dr Lamonte Sakai in 6 month   Atrial tachycardia ([place]) 04/03/2013   Overview:  Last Assessment & Plan:  I have discussed the likely benign nature of her problem. She has very minimal palpitations. I doubt atrial flutter. I would not anti-coagulate. Her monitor demonstrates that her episodes are short lived. Will follow.    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 2011   Chronic coronary artery disease 01/24/2011   Overview:  Last Assessment & Plan:  Stable with no ischemic symptoms.  Continue ASA, statin, ACEI, beta blocker.    Chronic diastolic CHF (congestive heart failure) ([place]) 09/24/2013   CKD (chronic kidney disease), stage IV (HCC) 09/24/2013   Closed fracture of head of left humerus 11/14/628   Complication of anesthesia    Essential hypertension 05/12/2009   Qualifier: Diagnosis of  By: Lamonte Sakai MD, Rose Fillers    Gallstones    GERD (gastroesophageal reflux disease)    Glaucoma    H/O atrial tachycardia 05/12/2009   Qualifier: History of  By: Lamonte Sakai MD, Rose Fillers   Overview:  Overview:  Qualifier: History of  By: Lamonte Sakai MD, Rose Fillers  Last Assessment & Plan:  There was no evidence of this on her cardiac monitor. I would recommend watchful waiting.    Hearing loss of right ear  History of aortic valve replacement 10/27/2010   Overview:  Last Assessment & Plan:  Will get echo to reassess bioprosthetic aortic valve.    History of non-ST elevation myocardial infarction (NSTEMI)    Sept 2014--  thought to be type II HTN w/ LHC without infarct related artery and patent grafts   History of radiation therapy 05/24/16-07/26/16   right breast 50.4 Gy in 28 fractions, right breast boost 10 Gy in 5 fractions   Hyperlipidemia    Hypertension    Resolved.   Iron deficiency anemia    Mania ([place]) 03/05/2016   Moderate persistent asthma    pulmologist-  Dr. Malvin Johns   Nonischemic cardiomyopathy [facility])    Osteomyelitis of toe of left foot ([place]) 04/06/2016   Osteopenia of multiple sites 10/19/2015   PAF (paroxysmal atrial fibrillation) ([place])    Personal history of chemotherapy 2017   Personal history of radiation therapy 2017   PONV (postoperative nausea and vomiting)    Port catheter in place 02/17/2016   Psoriasis    right leg   Renal calculus, right    Renal insufficiency, mild    S/P AVR    prosthesis valve placement  2009 at same time re-do CABG   Seizure ([place]) 06/29/2016   Seizure-like activity ([place]) 09/01/2017   Sensorineural hearing loss (SNHL) of both ears 01/06/2016   Stroke ([place])    residual rt hearing loss   Syncopal episodes 05/21/2018   Syncope 09/06/2017   Type 2 diabetes mellitus ([place])    monitored by dr Dwyane Dee    Past Surgical History:  Procedure Laterality Date   AORTIC VALVE REPLACEMENT  2009   #25mm [facility] Ease pericardial valve (done same time is CABG)   BREAST LUMPECTOMY Right 01/18/2016   BREAST LUMPECTOMY WITH RADIOACTIVE SEED AND SENTINEL LYMPH NODE BIOPSY Right 01/18/2016   Procedure: RIGHT BREAST LUMPECTOMY WITH RADIOACTIVE SEED AND SENTINEL LYMPH NODE BIOPSY;  Surgeon: Alphonsa Overall, MD;  Location: [facility];  Service: General;  Laterality: Right;   CARDIAC CATHETERIZATION  03/23/2008   Pre-redo CABG: L main OK, LAD (T), CFX (T), OM1 99%, RCA (T), LIMA-LAD OK, SVG-OM(?3) OK w/ little florw to OM2, SVG-RCA OK. EF NL   CARPAL TUNNEL RELEASE     CHOLECYSTECTOMY N/A 07/07/2018   Procedure: LAPAROSCOPIC CHOLECYSTECTOMY WITH INTRAOPERATIVE CHOLANGIOGRAM ERAS PATHWAY;  Surgeon: Alphonsa Overall, MD;  Location: [facility];  Service: General;  Laterality: N/A;   COLONOSCOPY     around 2015. Possibly with [place] &  re-do 2009   Had LIMA to DX/LAD, SVG to 2 marginal branches and SVG to [facility] originally; SVG to 3rd OM and PD at time of redo   CYSTOSCOPY W/ URETERAL STENT PLACEMENT Right 12/20/2014   Procedure: CYSTOSCOPY WITH RETROGRADE PYELOGRAM/URETERAL STENT PLACEMENT;  Surgeon: Cleon Gustin, MD;  Location: [facility];  Service: Urology;  Laterality: Right;   ESOPHAGOGASTRODUODENOSCOPY     many years ago per patient    ESOPHAGOGASTRODUODENOSCOPY ENDOSCOPY  06/17/2018   EYE SURGERY Bilateral    cataracts   HOLMIUM LASER APPLICATION Right 1/82/9937   Procedure:  HOLMIUM LASER LITHOTRIPSY;  Surgeon: Cleon Gustin,  MD;  [facility];  Service: Urology;  Laterality: Right;   IR THORACENTESIS ASP PLEURAL SPACE W/IMG GUIDE  10/03/2018   LEFT HEART CATHETERIZATION WITH CORONARY/GRAFT ANGIOGRAM N/A 02/23/2013   Procedure: LEFT HEART CATHETERIZATION WITH Beatrix Fetters;  Surgeon: Blane Ohara, MD;  Location: [facility];  Service: Cardiovascular;  Laterality: N/A;   LOOP RECORDER INSERTION N/A 08/30/2017   Procedure: LOOP RECORDER INSERTION;  Surgeon: Evans Lance, MD;  Location: [facility];  Service: Cardiovascular;  Laterality: N/A;   PORTACATH PLACEMENT Left 01/18/2016   Procedure: INSERTION PORT-A-CATH;  Surgeon: Alphonsa Overall, MD;  Location: [facility];  Service: General;  Laterality: Left;   portacath removal     TONSILLECTOMY     TRANSTHORACIC ECHOCARDIOGRAM  02-24-2013      mild LVH,  ef 50-55%/  AV bioprosthesis was present with very mild stenosis and no regurg., mean grandient 60mmHg, peak grandient 56mmHg /  mild MR/  mild LAE and RAE/  moderate TR   TUBAL LIGATION       Medications: Current Meds  Medication Sig   ACCU-CHEK GUIDE test strip Use to test blood sugar 3 times daily   acetaminophen (TYLENOL) 325 MG tablet Take 2 tablets (650 mg total) by mouth every 6 (six) hours as needed for mild pain (or Fever >/= 101).   albuterol (PROVENTIL HFA;VENTOLIN HFA) 108 (90 Base) MCG/ACT inhaler Inhale 2 puffs into the lungs every 6 (six) hours as needed for wheezing or shortness of breath.   amiodarone (PACERONE) 200 MG tablet Take 200 mg by mouth daily.   budesonide-formoterol (SYMBICORT) 160-4.5 MCG/ACT inhaler INHALE TWO PUFFS INTO THE LUNGS TWICE DAILY   ELIQUIS 5 MG TABS tablet TAKE ONE TABLET BY MOUTH TWICE DAILY   exemestane (AROMASIN) 25 MG tablet TAKE ONE TABLET BY MOUTH DAILY AFTER BREAKFAST   ezetimibe (ZETIA) 10 MG tablet TAKE ONE TABLET BY MOUTH EVERY DAY   famotidine (PEPCID) 20 MG tablet One after supper   insulin glargine (LANTUS)  100 UNIT/ML injection Inject 16 units under the skin once daily in the morning.   insulin lispro (HUMALOG) 100 UNIT/ML injection Inject 4-6 units under the skin three times daily before meals.   Insulin Pen Needle 32G X 4 MM MISC Use as instructed to inject insulin 4 times daily.   loratadine (CLARITIN) 10 MG tablet Take 10 mg by mouth daily as needed for allergies or rhinitis.    metoprolol tartrate (LOPRESSOR) 25 MG tablet Take 1 tablet (25 mg total) by mouth 2 (two) times daily.   nitroGLYCERIN (NITROSTAT) 0.4 MG SL tablet Place 1 tablet (0.4 mg total) under the tongue every 5 (five) minutes as needed for chest pain.   pantoprazole (PROTONIX) 40 MG tablet Take 1 tablet (40 mg total) by mouth daily.   rosuvastatin (CRESTOR) 40 MG tablet Take 1 tablet (40 mg total) by mouth at bedtime.   zolpidem (AMBIEN) 5 MG tablet TAKE ONE TABLET BY MOUTH AT BEDTIME AS NEEDED FOR SLEEP   [DISCONTINUED] spironolactone (ALDACTONE) 25 MG tablet Take 1 tablet (25 mg total) by mouth 2 (two) times daily. (Patient taking differently: Take 25 mg by mouth daily. )     Allergies: Allergies  Allergen Reactions   Amoxicillin Rash and Other (See Comments)    Tolerates Cephalosporins Has patient had a PCN reaction causing immediate rash, facial/tongue/throat swelling, SOB or lightheadedness with hypotension: No Has patient had a PCN reaction causing severe rash involving mucus membranes or skin necrosis: Yes Has patient had a PCN reaction that required hospitalization No Has patient had a PCN reaction occurring within the last 10 years: No If all of the above answers are "NO", then may proceed with Cephalosporin use.    Imdur [Isosorbide Dinitrate] Other (See Comments)  Headache/severe hypotension/Syncope   Tape Other (See Comments)    Must use paper tape   Arimidex [Anastrozole] Nausea Only   Latex Itching and Other (See Comments)    Dentist office only   Tetracycline Rash    Social  History: The patient  reports that she has never smoked. She has never used smokeless tobacco. She reports that she does not drink alcohol or use drugs.   Family History: The patient's family history includes Diabetes in her maternal grandmother and son; Healthy in her brother; Heart attack in her brother; Heart disease in her brother, father, and maternal grandmother; Heart failure in her father.   Review of Systems: Please see the history of present illness.   All other systems are reviewed and negative.   Physical Exam: VS:  BP 124/60    Pulse 62    Ht 5\' 5"  (1.651 m)    Wt 155 lb 12.8 oz (70.7 kg)    SpO2 99%    BMI 25.93 kg/m  .  BMI Body mass index is 25.93 kg/m.  Wt Readings from Last 3 Encounters:  12/02/18 155 lb 12.8 oz (70.7 kg)  10/28/18 163 lb 6.4 oz (74.1 kg)  10/13/18 175 lb (79.4 kg)    General: Pleasant. Alert and in no acute distress.  Her color is quite sallow. She looks poorly today. She has had significant weight loss noted.   HEENT: Normal.  Neck: Supple, no JVD, carotid bruits, or masses noted.  Cardiac: Regular rate and rhythm. Soft murmur noted. No edema.  Respiratory:  Lungs are clear to auscultation bilaterally with normal work of breathing.  GI: Soft and nontender.  MS: No deformity or atrophy. Gait and ROM intact.  Skin: Warm and dry. Color is normal.  Neuro:  Strength and sensation are intact and no gross focal deficits noted.  Psych: Alert, appropriate and with normal affect.   LABORATORY DATA:  EKG:  EKG is ordered today. This demonstrates NSR.  Lab Results  Component Value Date   WBC 5.9 10/28/2018   HGB 10.3 (L) 10/28/2018   HCT 31.0 (L) 10/28/2018   PLT 114.0 (L) 10/28/2018   GLUCOSE 139 (H) 11/18/2018   CHOL 110 05/12/2018   TRIG 82.0 05/12/2018   HDL 33.70 (L) 05/12/2018   LDLDIRECT 53.0 10/08/2016   LDLCALC 59 05/12/2018   ALT 40 (H) 11/18/2018   AST 59 (H) 11/18/2018   NA 139 11/18/2018   K 4.3 11/18/2018   CL 107 11/18/2018    CREATININE 2.13 (H) 11/18/2018   BUN 30 (H) 11/18/2018   CO2 27 11/18/2018   TSH 3.05 10/28/2018   INR 1.91 05/21/2018   HGBA1C 6.8 (H) 11/18/2018   MICROALBUR 53.4 (H) 05/12/2018     BNP (last 3 results) Recent Labs    02/04/18 0933  BNP 222.2*    ProBNP (last 3 results) Recent Labs    09/25/18 1113 10/28/18 1041  PROBNP 406.0* 364.0*     Other Studies Reviewed Today:  ECHO IMPRESSIONS 10/2018   1. The left ventricle has normal systolic function with an ejection fraction of 60-65%. The cavity size was normal. There is mildly increased left ventricular wall thickness. Left ventricular diastolic Doppler parameters are consistent with  pseudonormalization.  2. The right ventricle has normal systolic function. The cavity was mildly enlarged.  3. Left atrial size was moderately dilated.  4. Right atrial size was mildly dilated.  5. The mitral valve is grossly normal. Mild thickening of the mitral valve  leaflet. There is mild mitral annular calcification present.  6. The tricuspid valve is grossly normal.  7. There is mild dilatation of the ascending aorta measuring 40 mm.  8. Normal LV systolic function; moderate diastolic dysfunction; mild LVH; mildly dilated ascending aorta; s/p AVR with mean gradient of 18 mmHg and no AI; biatrial enlargement; mild MR; mild RVE.   EchoStudy Conclusions8/2019  - Left ventricle: The cavity size was normal. Systolic function was normal. The estimated ejection fraction was in the range of 60% to 65%. Wall motion was normal; there were no regional wall motion abnormalities. Features are consistent with a pseudonormal left ventricular filling pattern, with concomitant abnormal relaxation and increased filling pressure (grade 2 diastolic dysfunction). Doppler parameters are consistent with high ventricular filling pressure. - Aortic valve: A bioprosthesis was present and functioning normally. Mean gradient (S): 16  mm Hg. - Mitral valve: Calcified annulus. There was mild regurgitation. - Left atrium: The atrium was moderately dilated. - Right ventricle: The cavity size was mildly dilated. Wall thickness was normal. - Tricuspid valve: There was mild regurgitation. - Pulmonary arteries: Systolic pressure could not be accurately estimated.  EchoStudy ConclusionsDecember 2018  - Left ventricle: The cavity size was normal. Wall thickness was increased in a pattern of severe LVH. Systolic function was normal. The estimated ejection fraction was in the range of 60% to 65%. - Aortic valve: A bioprosthesis was present.   MYOVIEW FINDINGS FROM 06/2013: Normal resting EKG. Slight ST depressions noted in aVL after administration of lexiscan. Mild shortness of breath and nausea resolved after the test. EKG is nondiagnostic for ischemia. TID ratio 1.05. Lung-heart ratio 0.43. Normal ventricular chamber size.  IMPRESSION: No evidence for ischemia. Normal wall motion. LVEF 72%.   Electronically Signed By: Guy Sandifer On: 06/15/2013 14:27   Assessment/Plan:  1.Failure to thrive - worsening CKD - lower Bp and marked weight loss - rechecking lab today - sending for repeat CXR and stopping Aldactone.   2. Chronic diastolic HF - off Lasix - has worsening CKD with Aldactone - stopping today. BP is fine.   3. Prior recurrent spells of palpitations -back to regular dosing of metoprolol - remains on low dose amiodarone - surveillance lab today. Palpitations currently not a big issue.   4.Unexplained syncope-she has ILR in place. She has had AF noted. She continues to follow with EP  5.History oforthostasis/syncope- she is back on low dose Florinef -we will continue this for now.  6. CAD- prior CABG - she has no active symptoms. Continue with the current regimen.  LastLHC showed stable anatomy with patent grafts except for SVG-RCA from older CABG.   7. Anemia  -Now getting IV iron per nephrology.This is chronic  8. CKD- followed by nephrology - Dr. Posey Pronto- stopping Aldactone today  9. Carotid disease- noted 1 to 39% bilateral stenosis from study 10/2017 - will need repeat study 10/2019 - not discussed today  10. Recent lap chole - no appetite - weight is down.     11. High risk medicine - needs follow up lab today.   12. HTN - BP low normal - will follow - stopping Aldactone today   13. Pleural effusion - prior thoracentesis - recheck CXR today. She is to see pulmonary next week after PFTs.   23. COVID-19 Education: The signs and symptoms of COVID-19 were discussed with the patient and how to seek care for testing (follow up with PCP or arrange E-visit).  The importance of social distancing, staying at  home, hand hygiene and wearing a mask when out in public were discussed today.  Current medicines are reviewed with the patient today.  The patient does not have concerns regarding medicines other than what has been noted above.  The following changes have been made:  See above.  Labs/ tests ordered today include:    Orders Placed This Encounter  Procedures   DG Chest 2 View   Basic metabolic panel   CBC   Hepatic function panel   Lipid panel   TSH   EKG 12-Lead     Disposition:   FU with me in about 3 weeks.   Patient is agreeable to this plan and will call if any problems develop in the interim.   SignedTruitt Merle, NP  12/02/2018 10:26 AM  [facility] [address] [place], [place]  [address] Phone: [phone] Fax: [phone]

## 2018-12-02 ENCOUNTER — Other Ambulatory Visit: Payer: Self-pay

## 2018-12-02 ENCOUNTER — Encounter: Payer: Self-pay | Admitting: Nurse Practitioner

## 2018-12-02 ENCOUNTER — Ambulatory Visit
Admission: RE | Admit: 2018-12-02 | Discharge: 2018-12-02 | Disposition: A | Discharge status: Home / Self Care | Payer: Medicare Other | Source: Ambulatory Visit | Attending: Nurse Practitioner | Admitting: Nurse Practitioner

## 2018-12-02 ENCOUNTER — Ambulatory Visit (INDEPENDENT_AMBULATORY_CARE_PROVIDER_SITE_OTHER): Payer: Medicare Other | Admitting: Nurse Practitioner

## 2018-12-02 VITALS — BP 124/60 | HR 62 | Ht 65.0 in | Wt 155.8 lb

## 2018-12-02 DIAGNOSIS — R002 Palpitations: Secondary | ICD-10-CM | POA: Diagnosis not present

## 2018-12-02 DIAGNOSIS — I5032 Chronic diastolic (congestive) heart failure: Secondary | ICD-10-CM

## 2018-12-02 DIAGNOSIS — I951 Orthostatic hypotension: Secondary | ICD-10-CM

## 2018-12-02 DIAGNOSIS — Z79899 Other long term (current) drug therapy: Secondary | ICD-10-CM

## 2018-12-02 DIAGNOSIS — R0600 Dyspnea, unspecified: Secondary | ICD-10-CM

## 2018-12-02 LAB — CBC
Hematocrit: 30.1 % — ABNORMAL LOW (ref 34.0–46.6)
Hemoglobin: 9.6 g/dL — ABNORMAL LOW (ref 11.1–15.9)
MCH: 31.7 pg (ref 26.6–33.0)
MCHC: 31.9 g/dL (ref 31.5–35.7)
MCV: 99 fL — ABNORMAL HIGH (ref 79–97)
Platelets: 131 10*3/uL — ABNORMAL LOW (ref 150–450)
RBC: 3.03 x10E6/uL — ABNORMAL LOW (ref 3.77–5.28)
RDW: 12 % (ref 11.7–15.4)
WBC: 7.1 10*3/uL (ref 3.4–10.8)

## 2018-12-02 LAB — LIPID PANEL
Chol/HDL Ratio: 3.1 ratio (ref 0.0–4.4)
Cholesterol, Total: 106 mg/dL (ref 100–199)
HDL: 34 mg/dL — ABNORMAL LOW (ref >39)
LDL Calculated: 53 mg/dL (ref 0–99)
Triglycerides: 97 mg/dL (ref 0–149)
VLDL Cholesterol Cal: 19 mg/dL (ref 5–40)

## 2018-12-02 LAB — HEPATIC FUNCTION PANEL
ALT: 51 IU/L — ABNORMAL HIGH (ref 0–32)
AST: 66 IU/L — ABNORMAL HIGH (ref 0–40)
Albumin: 3.5 g/dL — ABNORMAL LOW (ref 3.7–4.7)
Alkaline Phosphatase: 74 IU/L (ref 39–117)
Bilirubin Total: 0.6 mg/dL (ref 0.0–1.2)
Bilirubin, Direct: 0.25 mg/dL (ref 0.00–0.40)
Total Protein: 6.2 g/dL (ref 6.0–8.5)

## 2018-12-02 LAB — BASIC METABOLIC PANEL
BUN/Creatinine Ratio: 16 (ref 12–28)
BUN: 40 mg/dL — ABNORMAL HIGH (ref 8–27)
CO2: 20 mmol/L (ref 20–29)
Calcium: 9 mg/dL (ref 8.7–10.3)
Chloride: 103 mmol/L (ref 96–106)
Creatinine, Ser: 2.57 mg/dL — ABNORMAL HIGH (ref 0.57–1.00)
GFR calc Af Amer: 21 mL/min/{1.73_m2} — ABNORMAL LOW (ref >59)
GFR calc non Af Amer: 18 mL/min/{1.73_m2} — ABNORMAL LOW (ref >59)
Glucose: 168 mg/dL — ABNORMAL HIGH (ref 65–99)
Potassium: 5.1 mmol/L (ref 3.5–5.2)
Sodium: 137 mmol/L (ref 134–144)

## 2018-12-02 LAB — TSH: TSH: 3.48 u[IU]/mL (ref 0.450–4.500)

## 2018-12-02 MED ORDER — FLUDROCORTISONE ACETATE 0.1 MG PO TABS: 0.1000 mg | ORAL_TABLET | Freq: Every day | ORAL | Status: DC

## 2018-12-02 NOTE — Patient Instructions (Addendum)
After Visit Summary:  We will be checking the following labs today - BMET, CBC, HPF, Lipids and TSH   Medication Instructions:    Continue with your current medicines. BUT  STOP ALDACTONE   If you need a refill on your cardiac medications before your next appointment, please call your pharmacy.     Testing/Procedures To Be Arranged:  Please go to [facility] to [place] on the first floor for a chest Xray - you may walk in.   Follow-Up:   See me in 3 weeks    At [facility], you and your health needs are our priority.  As part of our continuing mission to provide you with exceptional heart care, we have created designated Provider Care Teams.  These Care Teams include your primary Cardiologist (physician) and Advanced Practice Providers (APPs -  Physician Assistants and Nurse Practitioners) who all work together to provide you with the care you need, when you need it.  Special Instructions:  . Stay safe, stay home, wash your hands for at least 20 seconds and wear a mask when out in public.  . It was good to talk with you today.    Call the [place] office at [phone] if you have any questions, problems or concerns.

## 2018-12-03 ENCOUNTER — Encounter: Payer: Self-pay | Admitting: *Deleted

## 2018-12-03 ENCOUNTER — Other Ambulatory Visit: Payer: Self-pay | Admitting: *Deleted

## 2018-12-03 DIAGNOSIS — Z79899 Other long term (current) drug therapy: Secondary | ICD-10-CM

## 2018-12-03 DIAGNOSIS — N184 Chronic kidney disease, stage 4 (severe): Secondary | ICD-10-CM

## 2018-12-03 DIAGNOSIS — E876 Hypokalemia: Secondary | ICD-10-CM

## 2018-12-04 ENCOUNTER — Other Ambulatory Visit (HOSPITAL_COMMUNITY)
Admission: RE | Admit: 2018-12-04 | Discharge: 2018-12-04 | Disposition: A | Discharge status: Home / Self Care | Payer: Medicare Other | Source: Ambulatory Visit | Attending: Emergency Medicine | Admitting: Emergency Medicine

## 2018-12-04 DIAGNOSIS — Z1159 Encounter for screening for other viral diseases: Secondary | ICD-10-CM | POA: Diagnosis present

## 2018-12-04 LAB — SARS CORONAVIRUS 2 (TAT 6-24 HRS): SARS Coronavirus 2: NEGATIVE

## 2018-12-05 ENCOUNTER — Ambulatory Visit (INDEPENDENT_AMBULATORY_CARE_PROVIDER_SITE_OTHER): Payer: Medicare Other | Admitting: *Deleted

## 2018-12-05 ENCOUNTER — Other Ambulatory Visit: Payer: Self-pay | Admitting: Medical

## 2018-12-05 DIAGNOSIS — R55 Syncope and collapse: Secondary | ICD-10-CM | POA: Diagnosis not present

## 2018-12-05 NOTE — Telephone Encounter (Addendum)
Refill Request: Zolpidem   Last RX:09/02/18 Last OV:07/30/18 Next OV: None scheduled  UDS:11/20/17 CSC:11/20/17 CSR:12/05/2018  Rx refill snet to pt pharmacy.

## 2018-12-06 LAB — CUP PACEART REMOTE DEVICE CHECK
Date Time Interrogation Session: 20200626203935
Implantable Pulse Generator Implant Date: 20190322

## 2018-12-08 ENCOUNTER — Other Ambulatory Visit: Payer: Self-pay

## 2018-12-08 ENCOUNTER — Ambulatory Visit (INDEPENDENT_AMBULATORY_CARE_PROVIDER_SITE_OTHER): Payer: Medicare Other | Admitting: Emergency Medicine

## 2018-12-08 DIAGNOSIS — J45909 Unspecified asthma, uncomplicated: Secondary | ICD-10-CM | POA: Diagnosis not present

## 2018-12-08 LAB — PULMONARY FUNCTION TEST
DL/VA % pred: 122 %
DL/VA: 5.06 ml/min/mmHg/L
DLCO cor % pred: 97 %
DLCO cor: 18.69 ml/min/mmHg
DLCO unc % pred: 83 %
DLCO unc: 16.07 ml/min/mmHg
FEF 25-75 Post: 1.77 L/sec
FEF 25-75 Pre: 1.06 L/sec
FEF2575-%Change-Post: 66 %
FEF2575-%Pred-Post: 100 %
FEF2575-%Pred-Pre: 60 %
FEV1-%Change-Post: 12 %
FEV1-%Pred-Post: 70 %
FEV1-%Pred-Pre: 62 %
FEV1-Post: 1.53 L
FEV1-Pre: 1.36 L
FEV1FVC-%Change-Post: 3 %
FEV1FVC-%Pred-Pre: 102 %
FEV6-%Change-Post: 8 %
FEV6-%Pred-Post: 69 %
FEV6-%Pred-Pre: 64 %
FEV6-Post: 1.91 L
FEV6-Pre: 1.76 L
FEV6FVC-%Pred-Post: 104 %
FEV6FVC-%Pred-Pre: 104 %
FVC-%Change-Post: 8 %
FVC-%Pred-Post: 66 %
FVC-%Pred-Pre: 60 %
FVC-Post: 1.91 L
FVC-Pre: 1.76 L
Post FEV1/FVC ratio: 80 %
Post FEV6/FVC ratio: 100 %
Pre FEV1/FVC ratio: 77 %
Pre FEV6/FVC Ratio: 100 %
RV % pred: 122 %
RV: 2.75 L
TLC % pred: 89 %
TLC: 4.52 L

## 2018-12-08 NOTE — Progress Notes (Signed)
Full PFT performed today. °

## 2018-12-09 ENCOUNTER — Ambulatory Visit (INDEPENDENT_AMBULATORY_CARE_PROVIDER_SITE_OTHER): Payer: Medicare Other | Admitting: Emergency Medicine

## 2018-12-09 ENCOUNTER — Other Ambulatory Visit: Payer: Medicare Other

## 2018-12-09 ENCOUNTER — Encounter: Payer: Self-pay | Admitting: Emergency Medicine

## 2018-12-09 ENCOUNTER — Other Ambulatory Visit: Payer: Self-pay

## 2018-12-09 ENCOUNTER — Ambulatory Visit: Payer: Medicare Other | Admitting: Internal Medicine

## 2018-12-09 DIAGNOSIS — J301 Allergic rhinitis due to pollen: Secondary | ICD-10-CM

## 2018-12-09 DIAGNOSIS — J90 Pleural effusion, not elsewhere classified: Secondary | ICD-10-CM | POA: Diagnosis not present

## 2018-12-09 DIAGNOSIS — I4891 Unspecified atrial fibrillation: Secondary | ICD-10-CM

## 2018-12-09 DIAGNOSIS — J4520 Mild intermittent asthma, uncomplicated: Secondary | ICD-10-CM | POA: Diagnosis not present

## 2018-12-09 DIAGNOSIS — N184 Chronic kidney disease, stage 4 (severe): Secondary | ICD-10-CM

## 2018-12-09 DIAGNOSIS — E876 Hypokalemia: Secondary | ICD-10-CM

## 2018-12-09 DIAGNOSIS — Z79899 Other long term (current) drug therapy: Secondary | ICD-10-CM

## 2018-12-09 NOTE — Assessment & Plan Note (Signed)
Active right now.  Not currently on any medication.  She is benefited from loratadine most recently so we will restart this and follow

## 2018-12-09 NOTE — Assessment & Plan Note (Signed)
Her pulmonary function testing today confirms mixed obstruction and restriction, borderline bronchodilator response.  Based on this would continue her Symbicort as ordered, albuterol as needed.  She would benefit from some weight loss to help with her restrictive disease.

## 2018-12-09 NOTE — Progress Notes (Signed)
Subjective:    Patient ID: Hannah [name], female    DOB: January 11, 1946, 73 y.o.   MRN: [id number]   ROV 01/23/18 --Mrs. Mcewen is a 73 year old overweight woman, never smoker with a history of CAD/CABG, AVR, atrial fibrillation (back on amiodarone since mid 2018), breast cancer (surgery, chemo, radiation), documented restrictive and obstructive lung disease based on PFT from 03/2009.  Most recently she has been having issues with syncope, intermittent tachycardia, following w Dr Lovena Le. Her breathing is stable. She uses her Symbicort reliably. She occasionally has chest tightness, relieved by albuterol - uses 2x a month. Her PNA shots are UTD,   Most recent chest x-ray 09/06/2017 reviewed by me, shows no significant infiltrate, effusion.  Stable heart size, no evidence of pulmonary edema.     ROV 12/09/2018 --73 year old woman with obesity, never smoker, significant cardiac history with CAD/CABG, AVR, A. fib on amiodarone, breast cancer that was treated with surgery, chemo, radiation.  She has pulmonary function testing consistent with mixed restriction and obstruction, has been treated for obstruction with Symbicort.  Since I last saw her she has been followed in our office for progressive dyspnea, at least in part due to a transudative right effusion.  She underwent thoracentesis, cytology negative.  Chest x-ray done 6/23 was reviewed, shows no reaccumulation of her right pleural effusion, no other abnormality. She was treated briefly with aldactone, didn't tolerate so now off.    She returns today after pulmonary function testing that I reviewed, shows evidence for mixed obstruction and restriction, borderline bronchodilator response, pseudonormalization of her lung volumes consistent with probable obstruction and a normal diffusion capacity.  She is on Symbicort, believes that she benefits. Minimal albuterol use. No pred or abx. No episodes of bronchitis this year.  Her breathing improved significantly  after thoracentesis.  She has labs scheduled to follow renal fxn this week.   Her allergies are active - not currently on loratadine.                                                               Review of Systems as per HPI    Objective:   Physical Exam Vitals:   12/09/18 1344  BP: 124/68  Pulse: 60  SpO2: 100%  Weight: 159 lb (72.1 kg)  Height: 5\' 5"  (1.651 m)   Gen: Pleasant, obese, in no distress,  normal affect  ENT: No lesions,  mouth clear,  oropharynx clear, no postnasal drip  Neck: No JVD, no stridor  Lungs: No use of accessory muscles, clear without rales or rhonchi  Cardiovascular: irreg irreg, click present  Musculoskeletal: No deformities, no cyanosis or clubbing  Neuro: alert, non focal  Skin: Warm, no lesions or rashes   Assessment & Plan:  Allergic rhinitis Active right now.  Not currently on any medication.  She is benefited from loratadine most recently so we will restart this and follow  Atrial fibrillation ([place]) On amiodarone without any evidence of pulmonary toxicity  Asthma Her pulmonary function testing today confirms mixed obstruction and restriction, borderline bronchodilator response.  Based on this would continue her Symbicort as ordered, albuterol as needed.  She would benefit from some weight loss to help with her restrictive disease.  Pleural effusion on right In the setting of known left-sided heart disease,  also a recent gallbladder surgery.  Her effusion was resolved as a 6/23 chest x-ray.  She was started on Aldactone but will not be able to continue due to renal function and side effects.  Defer to cardiology with regard to adjustment of a diuretic regimen.  Follow chest x-ray if dyspnea returns.  Baltazar Apo, MD, PhD 12/09/2018, 2:04 PM [facility] [phone] or if no answer [phone]

## 2018-12-09 NOTE — Assessment & Plan Note (Signed)
In the setting of known left-sided heart disease, also a recent gallbladder surgery.  Her effusion was resolved as a 6/23 chest x-ray.  She was started on Aldactone but will not be able to continue due to renal function and side effects.  Defer to cardiology with regard to adjustment of a diuretic regimen.  Follow chest x-ray if dyspnea returns.

## 2018-12-09 NOTE — Patient Instructions (Signed)
Please continue Symbicort 2 puffs twice daily as you have been taking it.  Remember rinse gargle after using this medication. Keep your albuterol available use 2 puffs if needed for shortness of breath, chest tightness, wheezing. Please restart your loratadine (Claritin) 10 mg every day Get your lab work as planned to follow your kidney function Follow-up with cardiology as planned.  Adjustments may need to be made to optimize fluid status, prevent reaccumulation of pleural fluid while also protecting kidneys Follow with Dr Lamonte Sakai in 6 months or sooner if you have any problems

## 2018-12-09 NOTE — Assessment & Plan Note (Signed)
On amiodarone without any evidence of pulmonary toxicity

## 2018-12-10 ENCOUNTER — Other Ambulatory Visit: Payer: Self-pay | Admitting: Nurse Practitioner

## 2018-12-10 ENCOUNTER — Telehealth: Payer: Self-pay | Admitting: *Deleted

## 2018-12-10 ENCOUNTER — Other Ambulatory Visit (HOSPITAL_COMMUNITY): Payer: Medicare Other

## 2018-12-10 DIAGNOSIS — D509 Iron deficiency anemia, unspecified: Secondary | ICD-10-CM

## 2018-12-10 LAB — CBC WITH DIFFERENTIAL/PLATELET
Basophils Absolute: 0.1 10*3/uL (ref 0.0–0.2)
Basos: 1 %
EOS (ABSOLUTE): 0.3 10*3/uL (ref 0.0–0.4)
Eos: 4 %
Hematocrit: 29.5 % — ABNORMAL LOW (ref 34.0–46.6)
Hemoglobin: 9 g/dL — ABNORMAL LOW (ref 11.1–15.9)
Immature Grans (Abs): 0 10*3/uL (ref 0.0–0.1)
Immature Granulocytes: 1 %
Lymphocytes Absolute: 0.8 10*3/uL (ref 0.7–3.1)
Lymphs: 13 %
MCH: 31.3 pg (ref 26.6–33.0)
MCHC: 30.5 g/dL — ABNORMAL LOW (ref 31.5–35.7)
MCV: 102 fL — ABNORMAL HIGH (ref 79–97)
Monocytes Absolute: 0.6 10*3/uL (ref 0.1–0.9)
Monocytes: 10 %
Neutrophils Absolute: 4.3 10*3/uL (ref 1.4–7.0)
Neutrophils: 71 %
Platelets: 133 10*3/uL — ABNORMAL LOW (ref 150–450)
RBC: 2.88 x10E6/uL — ABNORMAL LOW (ref 3.77–5.28)
RDW: 13.9 % (ref 11.7–15.4)
WBC: 6 10*3/uL (ref 3.4–10.8)

## 2018-12-10 LAB — BASIC METABOLIC PANEL
BUN/Creatinine Ratio: 17 (ref 12–28)
BUN: 34 mg/dL — ABNORMAL HIGH (ref 8–27)
CO2: 23 mmol/L (ref 20–29)
Calcium: 8.8 mg/dL (ref 8.7–10.3)
Chloride: 106 mmol/L (ref 96–106)
Creatinine, Ser: 2.02 mg/dL — ABNORMAL HIGH (ref 0.57–1.00)
GFR calc Af Amer: 28 mL/min/{1.73_m2} — ABNORMAL LOW (ref >59)
GFR calc non Af Amer: 24 mL/min/{1.73_m2} — ABNORMAL LOW (ref >59)
Glucose: 119 mg/dL — ABNORMAL HIGH (ref 65–99)
Potassium: 4.7 mmol/L (ref 3.5–5.2)
Sodium: 140 mmol/L (ref 134–144)

## 2018-12-10 NOTE — Telephone Encounter (Signed)
Pt is aware a referral placed in system.

## 2018-12-10 NOTE — Telephone Encounter (Signed)
I will put a referral in - hopefully will be back with Gudina for her anemia.   Order placed.   [name]

## 2018-12-10 NOTE — Telephone Encounter (Signed)
Pt calling in today due to recent phone call today about [name]'s recommendations.  Pt stated Called Dr. Serita Grit office at [facility] and Dr. Posey Pronto is out till August.  Pt also stated only doctor pt seen at cancer center was Dr. Chong Sicilian.  Pt also stated cannot find the name of Hematologist due to pt hasn't  seen one for 12 years.  Stated pt might have to be referred.   Will send to [name] to advise.

## 2018-12-11 ENCOUNTER — Ambulatory Visit: Payer: Medicare Other | Admitting: Emergency Medicine

## 2018-12-11 NOTE — Progress Notes (Signed)
[facility] Summary Report / Loop Recorder 

## 2018-12-19 ENCOUNTER — Other Ambulatory Visit: Payer: Self-pay | Admitting: Hematology and Oncology

## 2018-12-19 DIAGNOSIS — N63 Unspecified lump in unspecified breast: Secondary | ICD-10-CM

## 2018-12-19 DIAGNOSIS — Z853 Personal history of malignant neoplasm of breast: Secondary | ICD-10-CM

## 2018-12-23 ENCOUNTER — Telehealth: Payer: Self-pay | Admitting: Nurse Practitioner

## 2018-12-23 NOTE — Telephone Encounter (Signed)

## 2018-12-23 NOTE — Progress Notes (Signed)
CARDIOLOGY OFFICE NOTE  Date:  12/24/2018    Hannah [name] Date of Birth: [birth year] Medical Record #[id number]  PCP:  Mackie Pai, PA-C  Cardiologist:  Atilano Median    Chief Complaint  Patient presents with  . Follow-up    1 month check. Seen for Dr. Lovena Le    History of Present Illness: Hannah [name] is a 73 y.o. female who presents today for a follow up visit. She isa former patient of Dr. Susa Simmonds. Seen for Dr. Aundra Dubin. She now follows with meand Dr. Lovena Le.   She has a history of CAD s/p CABG, bioprosthetic AVR, CKD,&paroxysmal atrial fibrillation. She had her initial CABG in 1998, then had redo CABG in 2009 with bioprosthetic AVR. She does not remember when she last had documented atrial fibrillation, but she thinks it was a number of years ago, soundedperi-operative around the time of her redo CABG. She was never put on anticoagulation other than Plavixbut had been on dronedaronein the past.   Over the years she has had intermittent issues. These have included tachypalpitations and chest pain. Last cath in 2014 - grafts patent. Myoview in 2015 without ischemia.Event monitor in 8/16 showed atrial tachycardia versus atypical atrial flutter. Toprol XL was increased.She saw Dr Lovena Le who thought that she had short runs of atrial tachycardia and not atrial flutter. Therefore, she hadnot been anticoagulated until here recently.   Wasdiagnosed with breast cancerin 2017/2018.She hadsurgery.Gotchemo/radiation - no Adriamycin due to her history of CHF.Her course was complicated by volume overload and anemia.  In January of 2018began to haveorthostasis. She was placed on midodrine and her lasix was changed to as needed only.ACE and beta blocker stopped.Ended up getting on amiodarone in October of 2018 for palpitations/atrial arrhythmias. I saw her in December - she was doing ok - BP started tracking way back up with the Midodrine and we  adjusted her medicines. She had a spell of syncopeearlier in theyear in 2019and fell - broke her shoulder and sustained a large hematoma. She now has a loop in place.   She was admitted twice in [facility] 2019- recurrent syncope - her loop has been ok. EEG without seizure noted.She wasplaced on Florinef. She was taken off her Ultram and Gabapentin. She was not using Lasix.Found to have some PAF this past summer - placed on Eliquis and her aspirin/Plavix were stopped. Shedid have thereturn of palpitations - ended up seeing her with Dr. Lovena Le and we opted to increase her amiodarone to BID dosingshort term- this helped.  She was in the ER towards the end of the summer - volume overload - treated with IV lasix. I then saw her and she was doing better. Now using just prn Lasix to avoid orthostasis/syncope. We got her echo updated -has grade 2 DD and EF is still ok.Plan was to cut amiodarone back to QD dosing as of October 1.Last seen by me back in March - she had gotten some iron infusions and had her gallbladder out in the interim - did well.   Last saw Dr. Lovena Le in April - had had some angina - beta blocker was increased - BP was high so Florinef was cut back. Since that visit she has had more issues with DOE/found to have pleural effusion - had thoracentesis. Echo was updated - she was put back on diuretics - which included Aldactone - got orthostatic again - I have placed her back on Florinef.   I then saw her last month -  she looked bad. Was doing poorly. Profound weakness. Significant weight loss. I stopped the Aldactone - she had worsening kidney function. Repeat CXR was stable. She has been back to pulmonary - had a good visit. I ended up referring her back to hematology/oncology to get her anemia addressed - she has been on Aranesp and I think IV iron in the remote past.   The patient does not have symptoms concerning for COVID-19 infection (fever, chills, cough, or new shortness  of breath).   Comes in today. Here alone. She is doing better. Feeling more like "her old self". BP ok at home - the highest she has seen is 697 systolic. Not using any Lasix. No syncopal spells. No chest pain. She has not heard from oncology/hematology about her anemia. She used to see Dr. Ralene Ok - she was on Aranesp and IV iron in the past - neither of [place] are able to remember why this was stopped. She has had some IV iron with nephrology but nothing regular. She required this with her chemo.    Past Medical History:  Diagnosis Date  . Abnormally small mouth   . Acute on chronic combined systolic and diastolic congestive heart failure ([place]) 07/17/2013  . Adrenal insufficiency ([place]) 07/03/2016  . AKI (acute kidney injury) ([place]) 09/01/2017  . Allergic rhinitis 10/14/2009   Qualifier: Diagnosis of  By: Lamonte Sakai MD, Rose Fillers   Overview:  Overview:  Qualifier: Diagnosis of  By: Lamonte Sakai MD, Rose Fillers  Last Assessment & Plan:  Please continue Xyzal and Nasacort as you have been using them  . Anemia   . Asthma 05/12/2009   10/12/2014 p extensive coaching HFA effectiveness =    75% s spacer    Overview:  Overview:  10/12/2014 p extensive coaching HFA effectiveness =    75% s spacer   Last Assessment & Plan:  Please continue Symbicort 2 puffs twice a day. Remember to rinse and gargle after taking this medication. Take albuterol 2 puffs up to every 4 hours if needed for shortness of breath.  Follow with Dr Lamonte Sakai in 6 month  . Atrial tachycardia ([place]) 04/03/2013   Overview:  Last Assessment & Plan:  I have discussed the likely benign nature of her problem. She has very minimal palpitations. I doubt atrial flutter. I would not anti-coagulate. Her monitor demonstrates that her episodes are short lived. Will follow.   . Bilateral carotid artery stenosis    Bilateral ICA 40-59%/  >50% LECA  . Breast cancer ([place]) 01/18/2016   right breast  . Breast cancer of upper-inner quadrant of right female breast ([place]) 12/16/2015  . CAD  (coronary artery disease) cardiologist-  dr Aundra Dubin   Remote CABG in 1998 with redo in 2009  . Chemotherapy induced nausea and vomiting 02/24/2016  . Chemotherapy-induced peripheral neuropathy ([place]) 04/27/2016  . 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 CHF (congestive heart failure) (H[facility])    EF is low normal at 50 to 55% per echo in Jan. 2011  . Chronic coronary artery disease 01/24/2011   Overview:  Last Assessment & Plan:  Stable with no ischemic symptoms.  Continue ASA, statin, ACEI, beta blocker.   . Chronic diastolic CHF (congestive heart failure) ([place]) 09/24/2013  . CKD (chronic kidney disease), stage IV ([place]) 09/24/2013  . Closed fracture of head of left humerus 09/09/2017  . Complication of anesthesia   . Essential hypertension 05/12/2009   Qualifier: Diagnosis of  By: Lamonte Sakai MD, Rose Fillers   . Gallstones   .  GERD (gastroesophageal reflux disease)   . Glaucoma   . H/O atrial tachycardia 05/12/2009   Qualifier: History of  By: Lamonte Sakai MD, Rose Fillers   Overview:  Overview:  Qualifier: History of  By: Lamonte Sakai MD, Rose Fillers  Last Assessment & Plan:  There was no evidence of this on her cardiac monitor. I would recommend watchful waiting.   Marland Kitchen Hearing loss of right ear   . History of aortic valve replacement 10/27/2010   Overview:  Last Assessment & Plan:  Will get echo to reassess bioprosthetic aortic valve.   Marland Kitchen History of non-ST elevation myocardial infarction (NSTEMI)    Sept 2014--  thought to be type II HTN w/ LHC without infarct related artery and patent grafts  . History of radiation therapy 05/24/16-07/26/16   right breast 50.4 Gy in 28 fractions, right breast boost 10 Gy in 5 fractions  . Hyperlipidemia   . Hypertension    Resolved.  . Iron deficiency anemia   . Mania ([place]) 03/05/2016  . Moderate persistent asthma    pulmologist-  Dr. Malvin Johns  . Nonischemic cardiomyopathy ([place])   . Osteomyelitis of toe of left foot ([place]) 04/06/2016  . Osteopenia of multiple sites 10/19/2015  . PAF  (paroxysmal atrial fibrillation) ([place])   . Personal history of chemotherapy 2017  . Personal history of radiation therapy 2017  . PONV (postoperative nausea and vomiting)   . Port catheter in place 02/17/2016  . Psoriasis    right leg  . Renal calculus, right   . Renal insufficiency, mild   . S/P AVR    prosthesis valve placement 2009 at same time re-do CABG  . Seizure ([place]) 06/29/2016  . Seizure-like activity ([place]) 09/01/2017  . Sensorineural hearing loss (SNHL) of both ears 01/06/2016  . Stroke [facility])    residual rt hearing loss  . Syncopal episodes 05/21/2018  . Syncope 09/06/2017  . Type 2 diabetes mellitus ([place])    monitored by dr Dwyane Dee    Past Surgical History:  Procedure Laterality Date  . AORTIC VALVE REPLACEMENT  2009   #80mm [facility] Ease pericardial valve (done same time is CABG)  . BREAST LUMPECTOMY Right 01/18/2016  . BREAST LUMPECTOMY WITH RADIOACTIVE SEED AND SENTINEL LYMPH NODE BIOPSY Right 01/18/2016   Procedure: RIGHT BREAST LUMPECTOMY WITH RADIOACTIVE SEED AND SENTINEL LYMPH NODE BIOPSY;  Surgeon: Alphonsa Overall, MD;  Location: [place];  Service: General;  Laterality: Right;  . CARDIAC CATHETERIZATION  03/23/2008   Pre-redo CABG: L main OK, LAD (T), CFX (T), OM1 99%, RCA (T), LIMA-LAD OK, SVG-OM(?3) OK w/ little florw to OM2, SVG-RCA OK. EF NL  . CARPAL TUNNEL RELEASE    . CHOLECYSTECTOMY N/A 07/07/2018   Procedure: LAPAROSCOPIC CHOLECYSTECTOMY WITH INTRAOPERATIVE CHOLANGIOGRAM ERAS PATHWAY;  Surgeon: Alphonsa Overall, MD;  Location: [facility];  Service: General;  Laterality: N/A;  . COLONOSCOPY     around 2015. Possibly with [facility]   . CORONARY ARTERY BYPASS GRAFT  1998 &  re-do 2009   Had LIMA to DX/LAD, SVG to 2 marginal branches and SVG to [facility] originally; SVG to 3rd OM and PD at time of redo  . CYSTOSCOPY W/ URETERAL STENT PLACEMENT Right 12/20/2014   Procedure: CYSTOSCOPY WITH RETROGRADE PYELOGRAM/URETERAL STENT PLACEMENT;  Surgeon: Cleon [place], MD;  Location:  [facility];  Service: Urology;  Laterality: Right;  . ESOPHAGOGASTRODUODENOSCOPY     many years ago per patient   . ESOPHAGOGASTRODUODENOSCOPY ENDOSCOPY  06/17/2018  . EYE SURGERY Bilateral  cataracts  . HOLMIUM LASER APPLICATION Right 12/05/9483   Procedure:  HOLMIUM LASER LITHOTRIPSY;  Surgeon: Cleon [place], MD;  Location: [facility];  Service: Urology;  Laterality: Right;  . IR THORACENTESIS ASP PLEURAL SPACE W/IMG GUIDE  10/03/2018  . LEFT HEART CATHETERIZATION WITH CORONARY/GRAFT ANGIOGRAM N/A 02/23/2013   Procedure: LEFT HEART CATHETERIZATION WITH Beatrix Fetters;  Surgeon: Blane Ohara, MD;  Location: [facility] CATH LAB;  Service: Cardiovascular;  Laterality: N/A;  . LOOP RECORDER INSERTION N/A 08/30/2017   Procedure: LOOP RECORDER INSERTION;  Surgeon: Evans Lance, MD;  Location: [place] CV LAB;  Service: Cardiovascular;  Laterality: N/A;  . PORTACATH PLACEMENT Left 01/18/2016   Procedure: INSERTION PORT-A-CATH;  Surgeon: Alphonsa Overall, MD;  Location: [place];  Service: General;  Laterality: Left;  . portacath removal    . TONSILLECTOMY    . TRANSTHORACIC ECHOCARDIOGRAM  02-24-2013      mild LVH,  ef 50-55%/  AV bioprosthesis was present with very mild stenosis and no regurg., mean grandient 25mmHg, peak grandient 33mmHg /  mild MR/  mild LAE and RAE/  moderate TR  . TUBAL LIGATION       Medications: Current Meds  Medication Sig  . ACCU-CHEK GUIDE test strip Use to test blood sugar 3 times daily  . acetaminophen (TYLENOL) 325 MG tablet Take 2 tablets (650 mg total) by mouth every 6 (six) hours as needed for mild pain (or Fever >/= 101).  Marland Kitchen albuterol (PROVENTIL HFA;VENTOLIN HFA) 108 (90 Base) MCG/ACT inhaler Inhale 2 puffs into the lungs every 6 (six) hours as needed for wheezing or shortness of breath.  Marland Kitchen amiodarone (PACERONE) 200 MG tablet Take 200 mg by mouth daily.  . budesonide-formoterol (SYMBICORT) 160-4.5 MCG/ACT inhaler INHALE  TWO PUFFS INTO THE LUNGS TWICE DAILY  . ELIQUIS 5 MG TABS tablet TAKE ONE TABLET BY MOUTH TWICE DAILY  . exemestane (AROMASIN) 25 MG tablet TAKE ONE TABLET BY MOUTH DAILY AFTER BREAKFAST  . ezetimibe (ZETIA) 10 MG tablet TAKE ONE TABLET BY MOUTH EVERY DAY  . famotidine (PEPCID) 20 MG tablet One after supper  . fludrocortisone (FLORINEF) 0.1 MG tablet Take 1 tablet (0.1 mg total) by mouth daily.  . insulin glargine (LANTUS) 100 UNIT/ML injection Inject 16 units under the skin once daily in the morning.  . insulin lispro (HUMALOG) 100 UNIT/ML injection Inject 4-6 units under the skin three times daily before meals.  . Insulin Pen Needle 32G X 4 MM MISC Use as instructed to inject insulin 4 times daily.  Marland Kitchen loratadine (CLARITIN) 10 MG tablet Take 10 mg by mouth daily.  . metoprolol tartrate (LOPRESSOR) 25 MG tablet Take 1 tablet (25 mg total) by mouth 2 (two) times daily.  . nitroGLYCERIN (NITROSTAT) 0.4 MG SL tablet Place 1 tablet (0.4 mg total) under the tongue every 5 (five) minutes as needed for chest pain.  . pantoprazole (PROTONIX) 40 MG tablet Take 1 tablet (40 mg total) by mouth daily.  . rosuvastatin (CRESTOR) 40 MG tablet Take 1 tablet (40 mg total) by mouth at bedtime.  Marland Kitchen zolpidem (AMBIEN) 5 MG tablet TAKE ONE TABLET BY MOUTH AT BEDTIME AS NEEDED FOR SLEEP  . [DISCONTINUED] metoprolol tartrate (LOPRESSOR) 25 MG tablet Take 1 tablet (25 mg total) by mouth 2 (two) times daily.     Allergies: Allergies  Allergen Reactions  . Amoxicillin Rash and Other (See Comments)    Tolerates Cephalosporins Has patient had a PCN reaction causing immediate rash, facial/tongue/throat swelling,  SOB or lightheadedness with hypotension: No Has patient had a PCN reaction causing severe rash involving mucus membranes or skin necrosis: Yes Has patient had a PCN reaction that required hospitalization No Has patient had a PCN reaction occurring within the last 10 years: No If all of the above answers are  "NO", then may proceed with Cephalosporin use.   . Aldactone [Spironolactone] Other (See Comments)    CKD/hypokalemia  . Imdur [Isosorbide Dinitrate] Other (See Comments)    Headache/severe hypotension/Syncope  . Tape Other (See Comments)    Must use paper tape  . Arimidex [Anastrozole] Nausea Only  . Latex Itching and Other (See Comments)    Dentist office only  . Tetracycline Rash    Social History: The patient  reports that she has never smoked. She has never used smokeless tobacco. She reports that she does not drink alcohol or use drugs.   Family History: The patient's family history includes Diabetes in her maternal grandmother and son; Healthy in her brother; Heart attack in her brother; Heart disease in her brother, father, and maternal grandmother; Heart failure in her father.   Review of Systems: Please see the history of present illness.   All other systems are reviewed and negative.   Physical Exam: VS:  BP (!) 160/52 (BP Location: Left Arm, Patient Position: Sitting, Cuff Size: Normal)   Pulse (!) 57   Ht 5\' 5"  (1.651 m)   Wt 161 lb 12.8 oz (73.4 kg)   BMI 26.92 kg/m  .  BMI Body mass index is 26.92 kg/m.  Wt Readings from Last 3 Encounters:  12/24/18 161 lb 12.8 oz (73.4 kg)  12/09/18 159 lb (72.1 kg)  12/02/18 155 lb 12.8 oz (70.7 kg)    General: Pleasant. She looks better today.  HEENT: Normal.  Neck: Supple, no JVD, carotid bruits, or masses noted.  Cardiac: Regular rate and rhythm. Outflow murmur noted.. No edema.  Respiratory:  Lungs are clear to auscultation bilaterally with normal work of breathing.  GI: Soft and nontender.  MS: No deformity or atrophy. Gait and ROM intact.  Skin: Warm and dry. Color is normal.  Neuro:  Strength and sensation are intact and no gross focal deficits noted.  Psych: Alert, appropriate and with normal affect.   LABORATORY DATA:  EKG:  EKG is ordered today. This demonstrates sinus bradycardia - non specific T wave  changes. HR is 57. QT is 488 ms.  Lab Results  Component Value Date   WBC 6.0 12/09/2018   HGB 9.0 (L) 12/09/2018   HCT 29.5 (L) 12/09/2018   PLT 133 (L) 12/09/2018   GLUCOSE 119 (H) 12/09/2018   CHOL 106 12/02/2018   TRIG 97 12/02/2018   HDL 34 (L) 12/02/2018   LDLDIRECT 53.0 10/08/2016   LDLCALC 53 12/02/2018   ALT 51 (H) 12/02/2018   AST 66 (H) 12/02/2018   NA 140 12/09/2018   K 4.7 12/09/2018   CL 106 12/09/2018   CREATININE 2.02 (H) 12/09/2018   BUN 34 (H) 12/09/2018   CO2 23 12/09/2018   TSH 3.480 12/02/2018   INR 1.91 05/21/2018   HGBA1C 6.8 (H) 11/18/2018   MICROALBUR 53.4 (H) 05/12/2018     BNP (last 3 results) Recent Labs    02/04/18 0933  BNP 222.2*    ProBNP (last 3 results) Recent Labs    09/25/18 1113 10/28/18 1041  PROBNP 406.0* 364.0*     Other Studies Reviewed Today:  ECHO IMPRESSIONS 10/2018  1. The left  ventricle has normal systolic function with an ejection fraction of 60-65%. The cavity size was normal. There is mildly increased left ventricular wall thickness. Left ventricular diastolic Doppler parameters are consistent with  pseudonormalization. 2. The right ventricle has normal systolic function. The cavity was mildly enlarged. 3. Left atrial size was moderately dilated. 4. Right atrial size was mildly dilated. 5. The mitral valve is grossly normal. Mild thickening of the mitral valve leaflet. There is mild mitral annular calcification present. 6. The tricuspid valve is grossly normal. 7. There is mild dilatation of the ascending aorta measuring 40 mm. 8. Normal LV systolic function; moderate diastolic dysfunction; mild LVH; mildly dilated ascending aorta; s/p AVR with mean gradient of 18 mmHg and no AI; biatrial enlargement; mild MR; mild RVE.   EchoStudy Conclusions8/2019  - Left ventricle: The cavity size was normal. Systolic function was normal. The estimated ejection fraction was in the range of 60% to  65%.Wall motion was normal; there were no regional wall motion abnormalities. Features are consistent with a pseudonormal left ventricular filling pattern, with concomitant abnormal relaxation and increased filling pressure (grade 2 diastolic dysfunction). Doppler parameters are consistent with high ventricular filling pressure. - Aortic valve: A bioprosthesis was present and functioning normally. Mean gradient (S): 16 mm Hg. - Mitral valve: Calcified annulus. There was mild regurgitation. - Left atrium: The atrium was moderately dilated. - Right ventricle: The cavity size was mildly dilated. Wall thickness was normal. - Tricuspid valve: There was mild regurgitation. - Pulmonary arteries: Systolic pressure could not be accurately estimated.  EchoStudy ConclusionsDecember 2018  - Left ventricle: The cavity size was normal. Wall thickness was increased in a pattern of severe LVH. Systolic function was normal. The estimated ejection fraction was in the range of 60% to 65%. - Aortic valve: A bioprosthesis was present.   MYOVIEW FINDINGS FROM 06/2013: Normal resting EKG. Slight ST depressions noted in aVL after administration of lexiscan. Mild shortness of breath and nausea resolved after the test. EKG is nondiagnostic for ischemia. TID ratio 1.05. Lung-heart ratio 0.43. Normal ventricular chamber size.  IMPRESSION: No evidence for ischemia. Normal wall motion. LVEF 72%.   Electronically Signed By: Guy Sandifer On: 06/15/2013 14:27   Assessment/Plan:  1.Failure to thrive - worsening CKD - lower Bp and marked weight loss - her CXR was improved. We stopped the Aldactone - she is improved clinically.   2. Chronic diastolic HF -will only use Lasix if really needed for swelling.   3. Prior recurrent spells of palpitations -back to regular dosing of metoprolol - remains on low dose amiodarone - not really endorsed today - metoprolol  refilled today.  4.Unexplained syncope-she has ILR in place. She has had AF noted. She continues to follow with EP  5.History oforthostasis/syncope- she is back on low dose Florinef -we will continue this for now.She will continue to monitor her BP - if it starts tracking higher at home - would stop this.   6. CAD- prior CABG and prior AVR - she has no active symptoms. Continue with the current regimen. LastLHC showed stable anatomy with patent grafts except for SVG-RCA from older CABG.   7. Anemia -she has not had regular follow up/infusions - rechecking lab today - will try to get her back to hematology.   8. CKD- followed by nephrology - Dr. Posey Pronto- no longer on Aldactone. Repeat lab today.   9. Carotid disease-noted 1 to 39% bilateral stenosis from study 10/2017 -will need repeat study 10/2019 - not discussed  today  10. Recent lap chole.    11. High risk medicine- rechecking lab today.   12. HTN- see #5.   13. Pleural effusion - prior thoracentesis - follow up CXR looked good.   74. COVID-19 Education: The signs and symptoms of COVID-19 were discussed with the patient and how to seek care for testing (follow up with PCP or arrange E-visit).  The importance of social distancing, staying at home, hand hygiene and wearing a mask when out in public were discussed today.  Current medicines are reviewed with the patient today.  The patient does not have concerns regarding medicines other than what has been noted above.  The following changes have been made:  See above.  Labs/ tests ordered today include:    Orders Placed This Encounter  Procedures  . Basic metabolic panel  . CBC  . Vitamin B12  . Folate  . Iron and TIBC  . Ferritin  . EKG 12-Lead     Disposition:   FU with me as planned in October.    Patient is agreeable to this plan and will call if any problems develop in the interim.   SignedTruitt Merle, NP  12/24/2018 12:18 PM   [place] [address] [place], [place]  [address] Phone: [phone] Fax: [phone]

## 2018-12-24 ENCOUNTER — Telehealth: Payer: Self-pay | Admitting: *Deleted

## 2018-12-24 ENCOUNTER — Telehealth: Payer: Self-pay

## 2018-12-24 ENCOUNTER — Other Ambulatory Visit: Payer: Self-pay

## 2018-12-24 ENCOUNTER — Ambulatory Visit (INDEPENDENT_AMBULATORY_CARE_PROVIDER_SITE_OTHER): Payer: Medicare Other | Admitting: Nurse Practitioner

## 2018-12-24 ENCOUNTER — Encounter: Payer: Self-pay | Admitting: Nurse Practitioner

## 2018-12-24 VITALS — BP 160/52 | HR 57 | Ht 65.0 in | Wt 161.8 lb

## 2018-12-24 DIAGNOSIS — I5032 Chronic diastolic (congestive) heart failure: Secondary | ICD-10-CM

## 2018-12-24 DIAGNOSIS — Z951 Presence of aortocoronary bypass graft: Secondary | ICD-10-CM

## 2018-12-24 DIAGNOSIS — Z79899 Other long term (current) drug therapy: Secondary | ICD-10-CM

## 2018-12-24 DIAGNOSIS — Z952 Presence of prosthetic heart valve: Secondary | ICD-10-CM

## 2018-12-24 DIAGNOSIS — N184 Chronic kidney disease, stage 4 (severe): Secondary | ICD-10-CM

## 2018-12-24 DIAGNOSIS — I2510 Atherosclerotic heart disease of native coronary artery without angina pectoris: Secondary | ICD-10-CM

## 2018-12-24 DIAGNOSIS — D509 Iron deficiency anemia, unspecified: Secondary | ICD-10-CM

## 2018-12-24 MED ORDER — METOPROLOL TARTRATE 25 MG PO TABS: 25.0000 mg | ORAL_TABLET | Freq: Two times a day (BID) | ORAL | 3 refills | Status: DC

## 2018-12-24 NOTE — Patient Instructions (Addendum)
After Visit Summary:  We will be checking the following labs today - BMET, CBC and anemia panel   Medication Instructions:    Continue with your current medicines.    If you need a refill on your cardiac medications before your next appointment, please call your pharmacy.     Testing/Procedures To Be Arranged:  N/A  Follow-Up:   See me in about 3 months  We will see about getting you back to see Dr. Lindi Adie about your anemia    At [facility], you and your health needs are our priority.  As part of our continuing mission to provide you with exceptional heart care, we have created designated Provider Care Teams.  These Care Teams include your primary Cardiologist (physician) and Advanced Practice Providers (APPs -  Physician Assistants and Nurse Practitioners) who all work together to provide you with the care you need, when you need it.  Special Instructions:  . Stay safe, stay home, wash your hands for at least 20 seconds and wear a mask when out in public.  . It was good to talk with you today.  Marland Kitchen Keep a check on your BP for me - let me know if it starts running higher.    Call the [place] office at [phone] if you have any questions, problems or concerns.

## 2018-12-24 NOTE — Telephone Encounter (Signed)
Received Refill request from [facility] for Metoprolol, pt not seen in our office  tx!

## 2018-12-24 NOTE — Telephone Encounter (Signed)
Called Dr. Geralyn Flash office @ [phone] and s/w andrea to inquire why pts referral was cancelled for [place].  Seth Bake stated pt was just seen on June 12. This ov was for pt's cancer the new referral is for anemia.  Seth Bake stated will send Dr.Gudena's nurse a message and than will call pt to schedule appt.

## 2018-12-25 LAB — IRON AND TIBC
Iron Saturation: 31 % (ref 15–55)
Iron: 64 ug/dL (ref 27–139)
Total Iron Binding Capacity: 205 ug/dL — ABNORMAL LOW (ref 250–450)
UIBC: 141 ug/dL (ref 118–369)

## 2018-12-25 LAB — BASIC METABOLIC PANEL
BUN/Creatinine Ratio: 13 (ref 12–28)
BUN: 31 mg/dL — ABNORMAL HIGH (ref 8–27)
CO2: 23 mmol/L (ref 20–29)
Calcium: 8.9 mg/dL (ref 8.7–10.3)
Chloride: 104 mmol/L (ref 96–106)
Creatinine, Ser: 2.44 mg/dL — ABNORMAL HIGH (ref 0.57–1.00)
GFR calc Af Amer: 22 mL/min/{1.73_m2} — ABNORMAL LOW (ref >59)
GFR calc non Af Amer: 19 mL/min/{1.73_m2} — ABNORMAL LOW (ref >59)
Glucose: 114 mg/dL — ABNORMAL HIGH (ref 65–99)
Potassium: 4.5 mmol/L (ref 3.5–5.2)
Sodium: 139 mmol/L (ref 134–144)

## 2018-12-25 LAB — CBC
Hematocrit: 28 % — ABNORMAL LOW (ref 34.0–46.6)
Hemoglobin: 8.8 g/dL — ABNORMAL LOW (ref 11.1–15.9)
MCH: 31.8 pg (ref 26.6–33.0)
MCHC: 31.4 g/dL — ABNORMAL LOW (ref 31.5–35.7)
MCV: 101 fL — ABNORMAL HIGH (ref 79–97)
Platelets: 134 10*3/uL — ABNORMAL LOW (ref 150–450)
RBC: 2.77 x10E6/uL — ABNORMAL LOW (ref 3.77–5.28)
RDW: 11.9 % (ref 11.7–15.4)
WBC: 5.8 10*3/uL (ref 3.4–10.8)

## 2018-12-25 LAB — VITAMIN B12: Vitamin B-12: 320 pg/mL (ref 232–1245)

## 2018-12-25 LAB — FOLATE: Folate: 10.4 ng/mL (ref >3.0)

## 2018-12-25 LAB — FERRITIN: Ferritin: 358 ng/mL — ABNORMAL HIGH (ref 15–150)

## 2018-12-30 ENCOUNTER — Telehealth: Payer: Self-pay | Admitting: Hematology and Oncology

## 2018-12-30 NOTE — Telephone Encounter (Signed)
Scheduled appt per 7/21 sch message - pt aware of appt date and time

## 2019-01-01 ENCOUNTER — Other Ambulatory Visit: Payer: Self-pay

## 2019-01-01 DIAGNOSIS — C50211 Malignant neoplasm of upper-inner quadrant of right female breast: Secondary | ICD-10-CM

## 2019-01-01 DIAGNOSIS — Z170 Estrogen receptor positive status [ER+]: Secondary | ICD-10-CM

## 2019-01-01 IMAGING — US US ABDOMEN COMPLETE
1 series · 14 of 25 positions shown · non-contrast
Comparison: 07/02/2017 renal ultrasound

CLINICAL DATA: Nausea dyspepsia right upper quadrant pain

EXAM:
ABDOMEN ULTRASOUND COMPLETE

[Series 1: us abdomen complete · 0.24mm/px · 14 of 68 slices shown]
[im 1/68]
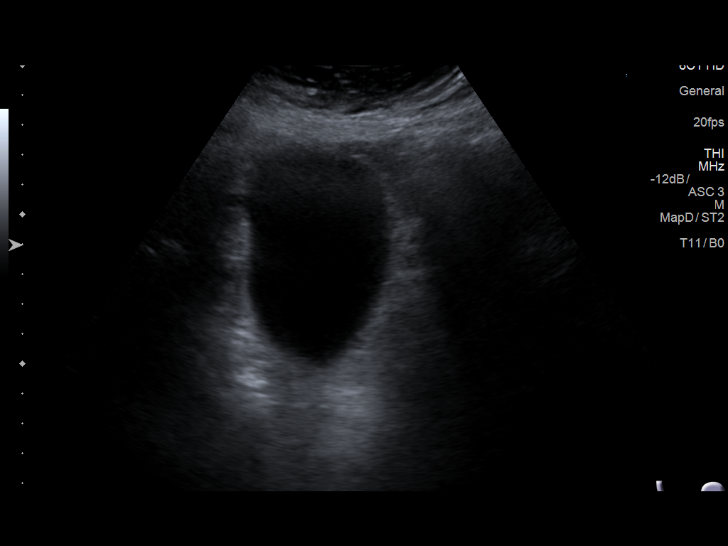
[im 6/68]
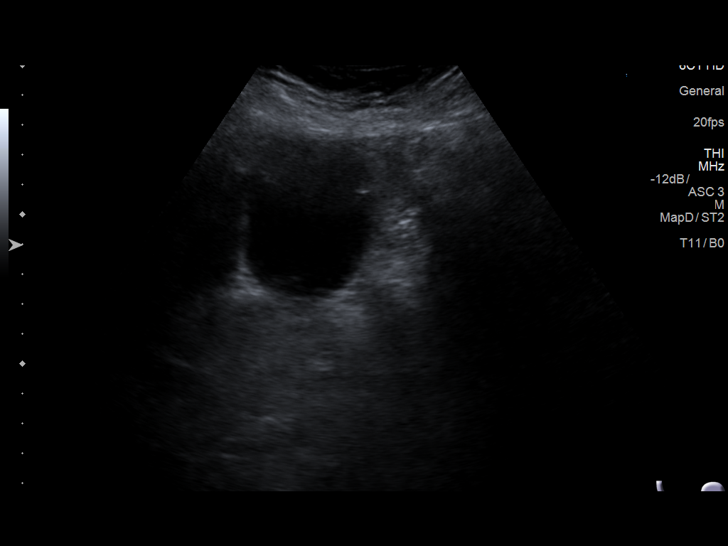
[im 12/68]
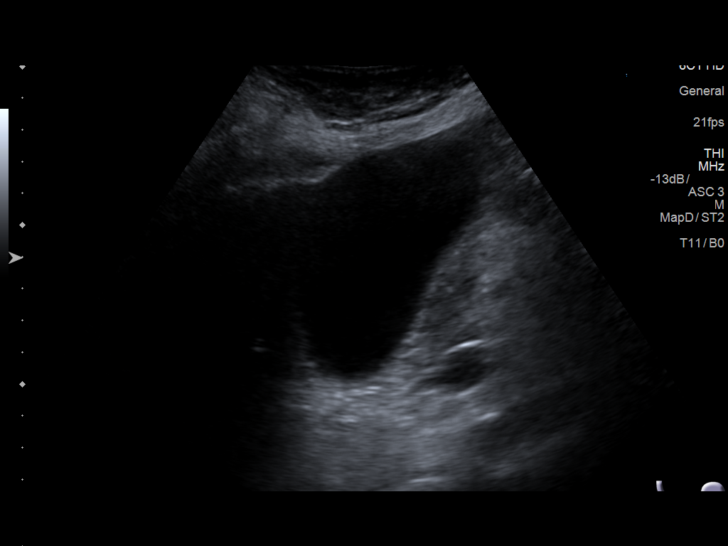
[im 17/68]
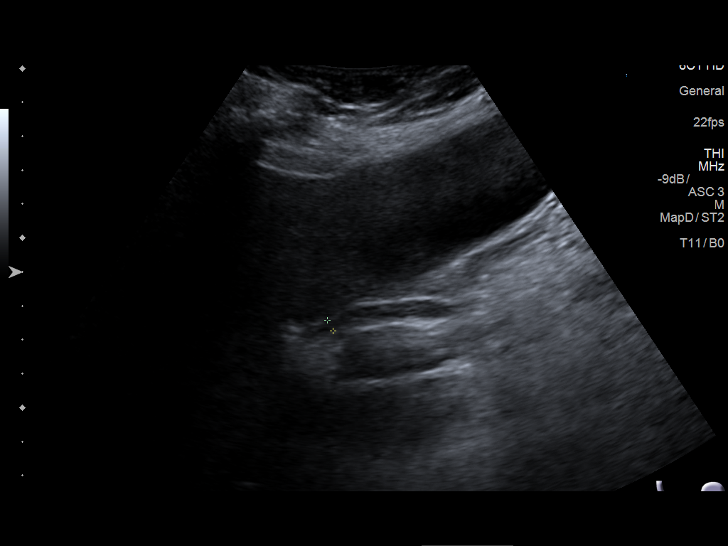
[im 23/68]
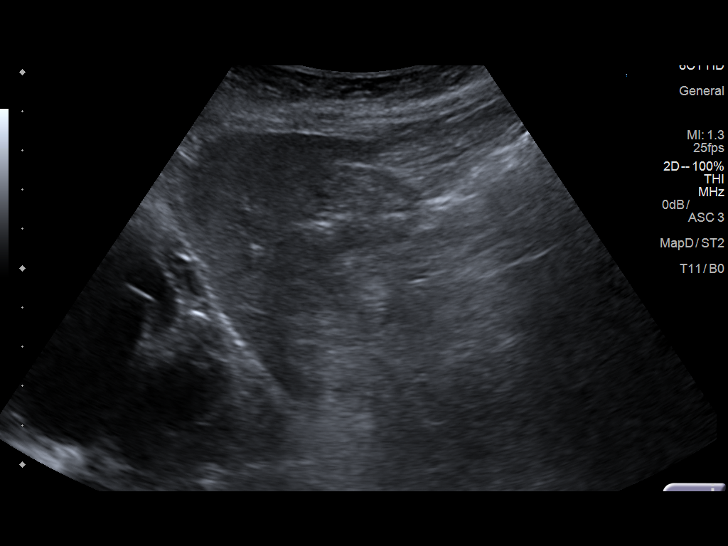
[im 26/68]
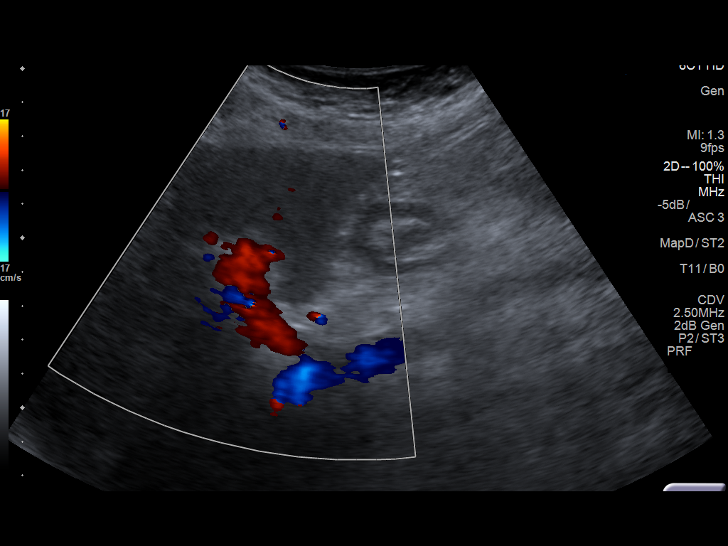
[im 31/68]
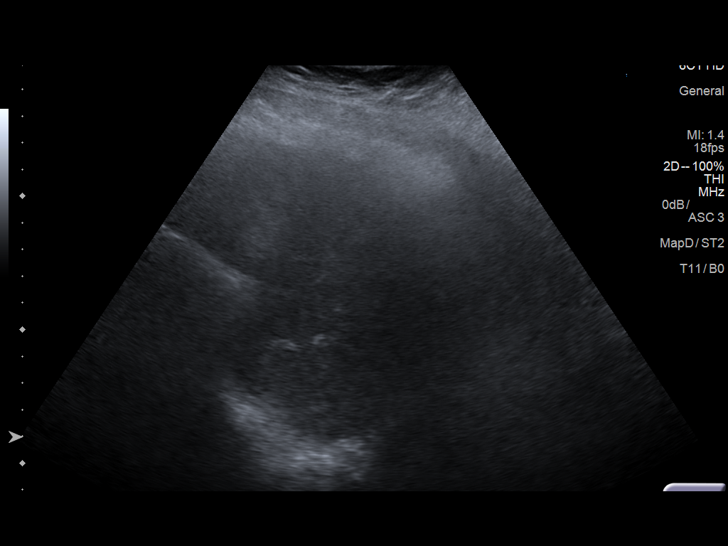
[im 37/68]
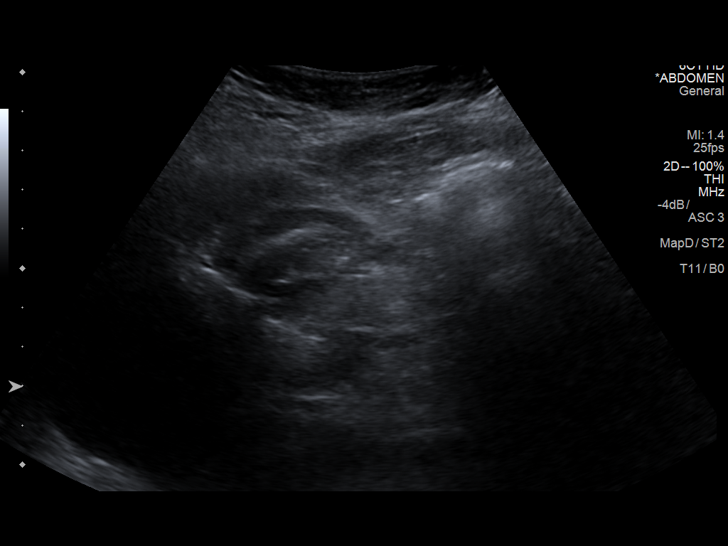
[im 42/68]
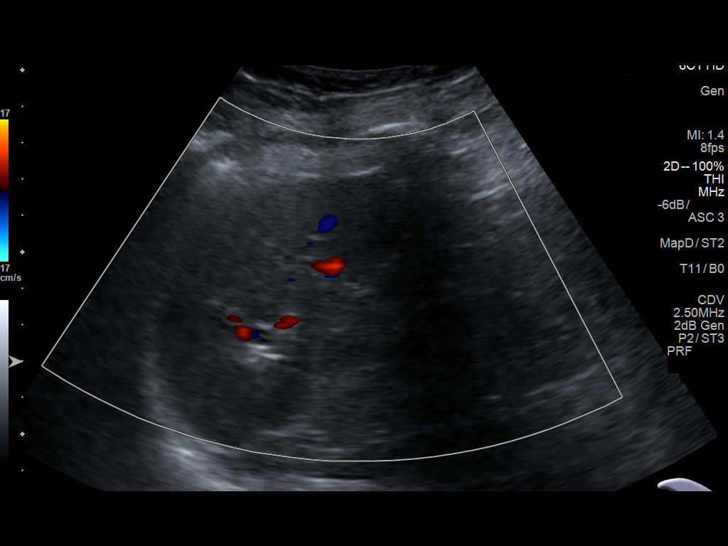
[im 45/68]
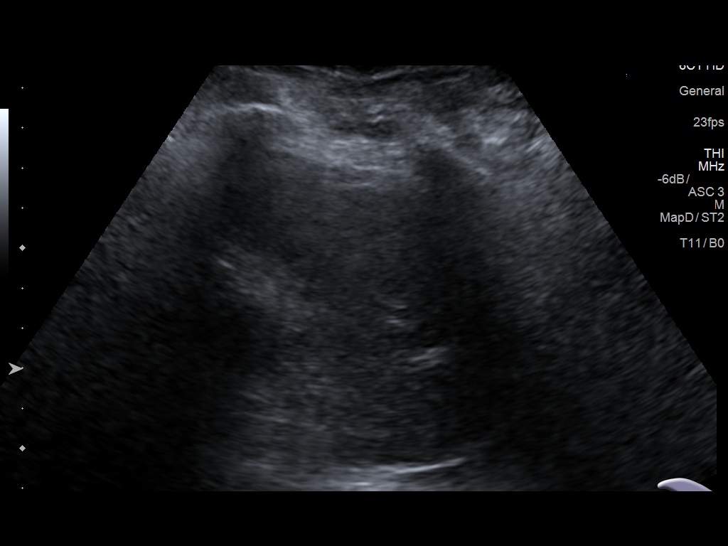
[im 51/68]
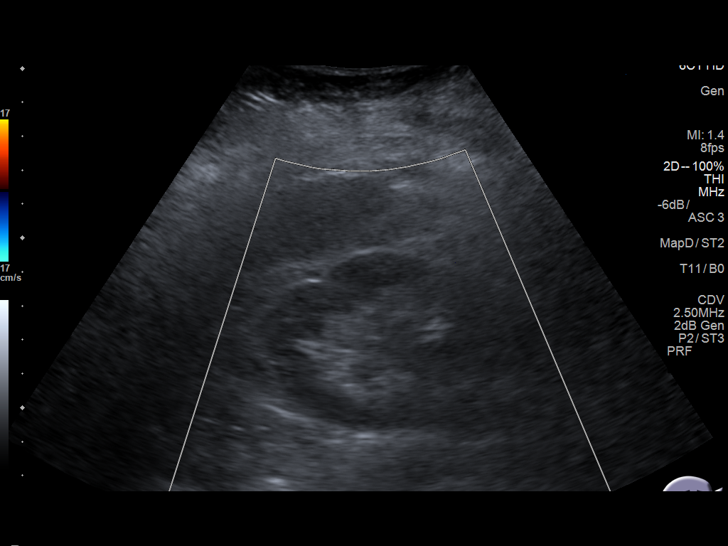
[im 56/68]
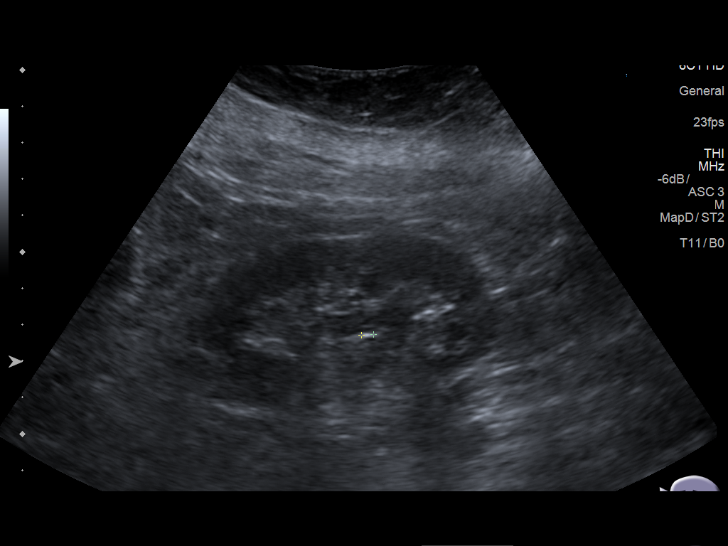
[im 62/68]
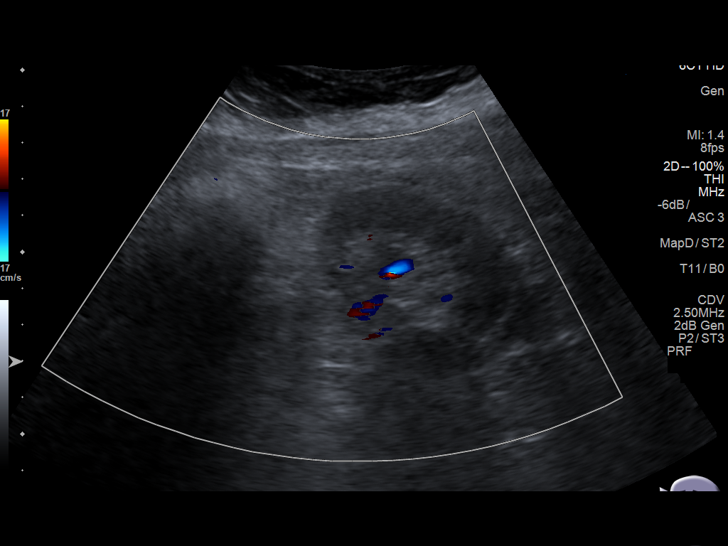
[im 68/68]
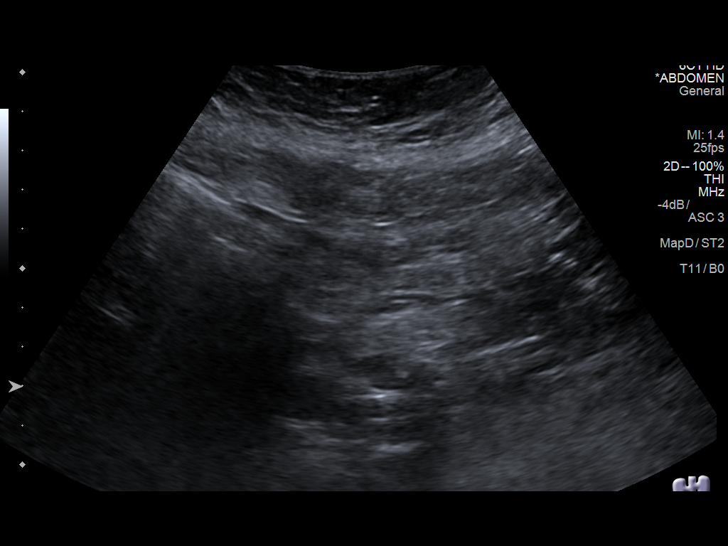

[14 of 25 positions shown; findings below may reference images not displayed]

FINDINGS: Gallbladder: Sludge and small stones measuring up to 5 mm. Negative
sonographic Murphy. Normal wall thickness.

Common bile duct: Diameter: 4 mm

Liver: No focal lesion identified. Within normal limits in
parenchymal echogenicity. Portal vein is patent on color Doppler
imaging with normal direction of blood flow towards the liver.

IVC: No abnormality visualized.

Pancreas: Visualized portion unremarkable.

Spleen: Size and appearance within normal limits.

Right Kidney: Length: 8.5 cm. Cortical echogenicity normal. No
hydronephrosis. Possible punctate 4 mm stone in the midpole.

Left Kidney: Length: 9.1 cm. Cortical echogenicity normal. No
hydronephrosis. Possible small 3 mm stone mid left kidney.

Aorta: Difficult to visualize.  No aneurysm.

Other findings: None.
IMPRESSION: 1. Small stones and sludge within the gallbladder without biliary
dilatation or other sonographic features to suggest acute
cholecystitis
2. Possible small nonobstructing kidney stones

## 2019-01-01 NOTE — Progress Notes (Signed)
Patient Care Team: Saguier, Iris Pert as PCP - General (Internal Medicine) Alphonsa Overall, MD as Consulting Physician (General Surgery) Nicholas Lose, MD as Consulting Physician (Hematology and Oncology) Gery Pray, MD as Consulting Physician (Radiation Oncology) Evans Lance, MD as Consulting Physician (Cardiology) Collene Gobble, MD as Consulting Physician (Pulmonary Disease) Elayne Snare, MD as Consulting Physician (Endocrinology) Delice Bison, Charlestine Massed, NP as Nurse Practitioner (Hematology and Oncology) Ardelle Balls., MD (Neurology) Izora Gala, MD as Consulting Physician (Otolaryngology) Trula Slade, DPM as Consulting Physician (Podiatry)  DIAGNOSIS:    ICD-10-CM   1. Anemia of chronic disease  D63.8 CBC with Differential (Cancer Center Only)    Sample to Blood Bank    Reticulocytes    Erythropoietin    CMP (Cancer Center only)    SUMMARY OF ONCOLOGIC HISTORY: Oncology History  Breast cancer of upper-inner quadrant of right female breast ([facility])  12/14/2015 Initial Diagnosis   Right breast biopsy 12:30 position: 2 masses, 1.6 cm mass: Invasive ductal carcinoma, grade 2, ER 0%, PR 0%, HER-2 negative, Ki-67 70%; satellite mass 8 mm: IDC grade 2 ER 5%, PR 5%, HER-2 negative, Ki-67 50%; T1cN0 stage IA clinical stage   01/18/2016 Surgery   Right lumpectomy: Multifocal IDC grade 3, 1.9 cm  ER 0%, PR 0%, HER-2 negative, Ki-67 70% and 0.8 cm satellite mass ER 5%, PR 2%, HER-2 negative, Ki-67 50%, high-grade DCIS, margins negative, 0/1 lymph nodes negative T1 cN0 stage IA   02/17/2016 - 04/06/2016 Chemotherapy   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 with above-mentioned history of right breast cancer treated with lumpectomy, adjuvant chemotherapy, radiation, and who is currently on antiestrogen therapy with exemestane. Recent labs at her PCP on 12/24/18 showed worsening anemia: Hg 8.8, HCT 28.0, MCV 101, platelets 134,000, creatinine 2.44, iron saturation 31%, ferritin 358, B-12 302, folate 10.4. She presents to the clinic today to review her labs and discuss treatment options.  She feels extremely fatigued and exhausted.  Short of breath to minimal exertion.  Today her hemoglobin is 6.6.  REVIEW OF SYSTEMS:   Constitutional: Denies fevers, chills or abnormal weight loss Eyes: Denies blurriness of vision Ears, nose, mouth, throat, and face: Denies mucositis or sore throat Respiratory: Denies cough, dyspnea or wheezes Cardiovascular: Denies palpitation, chest discomfort Gastrointestinal: Denies nausea, heartburn or change in bowel habits Skin: Denies abnormal skin rashes Lymphatics: Denies new lymphadenopathy or easy bruising Neurological: Denies numbness, tingling or new weaknesses Behavioral/Psych: Mood is stable, no new changes  Extremities: No lower extremity edema Breast: denies any pain or lumps or nodules in either breasts All other systems were reviewed with the patient and are negative.  I have reviewed the past medical history, past surgical history, social history and family history with the patient and they are unchanged from previous note.  ALLERGIES:  is allergic to amoxicillin; aldactone [spironolactone]; imdur [isosorbide dinitrate]; tape; arimidex [anastrozole]; latex; and tetracycline.  MEDICATIONS:  Current Outpatient Medications  Medication Sig Dispense Refill  . ACCU-CHEK GUIDE test  strip Use to test blood sugar 3 times daily 100 each 3  . acetaminophen (TYLENOL) 325 MG tablet Take 2 tablets (650 mg total) by mouth every 6 (six) hours as needed for mild pain (or Fever >/= 101).    Marland Kitchen albuterol (PROVENTIL HFA;VENTOLIN HFA) 108 (90 Base) MCG/ACT inhaler Inhale 2 puffs into the lungs every 6 (six) hours as needed for wheezing or shortness of breath.    Marland Kitchen amiodarone (PACERONE) 200 MG tablet Take 200 mg by mouth daily.    . budesonide-formoterol (SYMBICORT) 160-4.5 MCG/ACT inhaler INHALE TWO PUFFS INTO THE LUNGS TWICE DAILY 3 Inhaler 1  . ELIQUIS 5 MG TABS tablet TAKE ONE TABLET BY MOUTH TWICE DAILY 60 tablet 6  . exemestane (AROMASIN) 25 MG tablet TAKE ONE TABLET BY MOUTH DAILY AFTER BREAKFAST 90 tablet 3  . ezetimibe (ZETIA) 10 MG tablet TAKE ONE TABLET BY MOUTH EVERY DAY 90 tablet 3  . famotidine (PEPCID) 20 MG tablet One after supper 30 tablet 11  . fludrocortisone (FLORINEF) 0.1 MG tablet Take 1 tablet (0.1 mg total) by mouth daily.    . insulin glargine (LANTUS) 100 UNIT/ML injection Inject 16 units under the skin once daily in the morning. 20 mL 3  . insulin lispro (HUMALOG) 100 UNIT/ML injection Inject 4-6 units under the skin three times daily before meals. 20 mL 3  . Insulin Pen Needle 32G X 4 MM MISC Use as instructed to inject insulin 4 times daily. 400 each 3  . loratadine (CLARITIN) 10 MG tablet Take 10 mg by mouth daily.    . metoprolol tartrate (LOPRESSOR) 25 MG tablet Take 1 tablet (25 mg total) by mouth 2 (two) times daily. 180 tablet 3  . nitroGLYCERIN (NITROSTAT) 0.4 MG SL tablet Place 1 tablet (0.4 mg total) under the tongue every 5 (five) minutes as needed for chest pain. 30 tablet 3  . pantoprazole (PROTONIX) 40 MG tablet Take 1 tablet (40 mg total) by mouth daily. 30 tablet 11  . rosuvastatin (CRESTOR) 40 MG tablet Take 1 tablet (40 mg total) by mouth at bedtime. 90 tablet 3  . zolpidem (AMBIEN) 5 MG tablet TAKE ONE TABLET BY MOUTH AT BEDTIME AS NEEDED FOR  SLEEP 30 tablet 2   No current facility-administered medications for this visit.     PHYSICAL EXAMINATION: ECOG PERFORMANCE STATUS: 2 - Symptomatic, <50% confined to bed  Vitals:   01/02/19 1131  BP: (!) 123/35  Pulse: 61  Resp: 18  Temp: 98.3 F (36.8 C)  SpO2: 100%   Filed Weights   01/02/19 1131  Weight: 166 lb 1.6 oz (75.3 kg)    GENERAL: alert, no distress and comfortable SKIN: skin color, texture, turgor are normal, no rashes or significant lesions EYES: normal, Conjunctiva are pink and non-injected, sclera clear OROPHARYNX: no exudate, no erythema and lips, buccal mucosa, and tongue normal  NECK: supple, thyroid normal size, non-tender, without nodularity LYMPH: no palpable lymphadenopathy in the cervical, axillary or inguinal LUNGS: clear to auscultation and percussion with normal breathing effort HEART: regular rate & rhythm and no murmurs and no lower extremity edema ABDOMEN: abdomen soft, non-tender and normal bowel sounds MUSCULOSKELETAL: no cyanosis of digits and no clubbing  NEURO: alert & oriented x 3 with fluent speech, no focal motor/sensory deficits EXTREMITIES: No lower extremity edema  LABORATORY DATA:  I have reviewed the data as listed CMP Latest Ref Rng & Units 12/24/2018  12/09/2018 12/02/2018  Glucose 65 - 99 mg/dL 114(H) 119(H) 168(H)  BUN 8 - 27 mg/dL 31(H) 34(H) 40(H)  Creatinine 0.57 - 1.00 mg/dL 2.44(H) 2.02(H) 2.57(H)  Sodium 134 - 144 mmol/L 139 140 137  Potassium 3.5 - 5.2 mmol/L 4.5 4.7 5.1  Chloride 96 - 106 mmol/L 104 106 103  CO2 20 - 29 mmol/L _0 Calcium 8.7 - 10.3 mg/dL 8.9 8.8 9.0  Total Protein 6.0 - 8.5 g/dL - - 6.2  Total Bilirubin 0.0 - 1.2 mg/dL - - 0.6  Alkaline Phos 39 - 117 IU/L - - 74  AST 0 - 40 IU/L - - 66(H)  ALT 0 - 32 IU/L - - 51(H)    Lab Results  Component Value Date   WBC 5.6 01/02/2019   HGB 6.6 (LL) 01/02/2019   HCT 21.8 (L) 01/02/2019   MCV 104.3 (H) 01/02/2019   PLT 104 (L) 01/02/2019    NEUTROABS 4.3 01/02/2019    ASSESSMENT & PLAN:  Anemia of chronic disease Patient has had longstanding anemia where the hemoglobin was between 10 to 12 g. Since April 2020 her hemoglobin has gotten worse and today it is at 6.6. During the same timeframe her creatinine got from 2.2-9.79 G92 folic acid and iron studies were normal. 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  Plan:  1. We will initiate Aranesp injections starting next week. 2. We will plan for 2 units of blood transfusion today.  I will see her back next week to recheck her CBC and keep a close watch. I discussed with her that if the Aranesp injection does not maintain her hemoglobin then we may have to consider doing a bone marrow biopsy. She tells me that long time ago she had a bone marrow biopsy with Dr. Arnoldo Morale.  Breast cancer: Doing well with exemestane.    Orders Placed This Encounter  Procedures  . CBC with Differential (Cancer Center Only)    Standing Status:   Future    Standing Expiration Date:   01/02/2020  . Reticulocytes    Standing Status:   Future    Number of Occurrences:   1    Standing Expiration Date:   01/02/2020  . Erythropoietin    Standing Status:   Future    Number of Occurrences:   1    Standing Expiration Date:   01/02/2020  . CMP ([facility] only)    Standing Status:   Future    Number of Occurrences:   1    Standing Expiration Date:   01/02/2020  . Sample to Blood Bank    Standing Status:   Future    Number of Occurrences:   1    Standing Expiration Date:   01/02/2020   The patient has a good understanding of the overall plan. she agrees with it. she will call with any problems that may develop before the next visit here.  Nicholas Lose, MD 01/02/2019  Julious Oka Dorshimer am acting as scribe for Dr. Nicholas Lose.  I have reviewed the above documentation for accuracy and completeness, and I agree with the above.

## 2019-01-02 ENCOUNTER — Inpatient Hospital Stay: Payer: Medicare Other | Attending: Hematology and Oncology | Admitting: Hematology and Oncology

## 2019-01-02 ENCOUNTER — Other Ambulatory Visit: Payer: Self-pay

## 2019-01-02 ENCOUNTER — Inpatient Hospital Stay: Payer: Medicare Other

## 2019-01-02 VITALS — BP 123/35 | HR 61 | Temp 98.3°F | Resp 18 | Ht 65.0 in | Wt 166.1 lb

## 2019-01-02 DIAGNOSIS — Z79811 Long term (current) use of aromatase inhibitors: Secondary | ICD-10-CM

## 2019-01-02 DIAGNOSIS — Z79899 Other long term (current) drug therapy: Secondary | ICD-10-CM | POA: Diagnosis not present

## 2019-01-02 DIAGNOSIS — Z794 Long term (current) use of insulin: Secondary | ICD-10-CM | POA: Diagnosis not present

## 2019-01-02 DIAGNOSIS — D649 Anemia, unspecified: Secondary | ICD-10-CM

## 2019-01-02 DIAGNOSIS — R0602 Shortness of breath: Secondary | ICD-10-CM | POA: Diagnosis not present

## 2019-01-02 DIAGNOSIS — R5383 Other fatigue: Secondary | ICD-10-CM | POA: Insufficient documentation

## 2019-01-02 DIAGNOSIS — Z170 Estrogen receptor positive status [ER+]: Secondary | ICD-10-CM | POA: Diagnosis not present

## 2019-01-02 DIAGNOSIS — Z923 Personal history of irradiation: Secondary | ICD-10-CM

## 2019-01-02 DIAGNOSIS — D509 Iron deficiency anemia, unspecified: Secondary | ICD-10-CM

## 2019-01-02 DIAGNOSIS — C50211 Malignant neoplasm of upper-inner quadrant of right female breast: Secondary | ICD-10-CM

## 2019-01-02 DIAGNOSIS — D638 Anemia in other chronic diseases classified elsewhere: Secondary | ICD-10-CM

## 2019-01-02 DIAGNOSIS — Z9221 Personal history of antineoplastic chemotherapy: Secondary | ICD-10-CM | POA: Diagnosis not present

## 2019-01-02 DIAGNOSIS — Z7901 Long term (current) use of anticoagulants: Secondary | ICD-10-CM

## 2019-01-02 DIAGNOSIS — E1165 Type 2 diabetes mellitus with hyperglycemia: Secondary | ICD-10-CM

## 2019-01-02 LAB — CBC WITH DIFFERENTIAL (CANCER CENTER ONLY)
Abs Immature Granulocytes: 0.03 10*3/uL (ref 0.00–0.07)
Basophils Absolute: 0.1 10*3/uL (ref 0.0–0.1)
Basophils Relative: 1 %
Eosinophils Absolute: 0.3 10*3/uL (ref 0.0–0.5)
Eosinophils Relative: 5 %
HCT: 21.8 % — ABNORMAL LOW (ref 36.0–46.0)
Hemoglobin: 6.6 g/dL — CL (ref 12.0–15.0)
Immature Granulocytes: 1 %
Lymphocytes Relative: 8 %
Lymphs Abs: 0.5 10*3/uL — ABNORMAL LOW (ref 0.7–4.0)
MCH: 31.6 pg (ref 26.0–34.0)
MCHC: 30.3 g/dL (ref 30.0–36.0)
MCV: 104.3 fL — ABNORMAL HIGH (ref 80.0–100.0)
Monocytes Absolute: 0.5 10*3/uL (ref 0.1–1.0)
Monocytes Relative: 9 %
Neutro Abs: 4.3 10*3/uL (ref 1.7–7.7)
Neutrophils Relative %: 76 %
Platelet Count: 104 10*3/uL — ABNORMAL LOW (ref 150–400)
RBC: 2.09 MIL/uL — ABNORMAL LOW (ref 3.87–5.11)
RDW: 13.3 % (ref 11.5–15.5)
WBC Count: 5.6 10*3/uL (ref 4.0–10.5)
nRBC: 0 % (ref 0.0–0.2)

## 2019-01-02 LAB — CMP (CANCER CENTER ONLY)
ALT: 25 U/L (ref 0–44)
AST: 37 U/L (ref 15–41)
Albumin: 2.3 g/dL — ABNORMAL LOW (ref 3.5–5.0)
Alkaline Phosphatase: 57 U/L (ref 38–126)
Anion gap: 6 (ref 5–15)
BUN: 44 mg/dL — ABNORMAL HIGH (ref 8–23)
CO2: 25 mmol/L (ref 22–32)
Calcium: 8.4 mg/dL — ABNORMAL LOW (ref 8.9–10.3)
Chloride: 108 mmol/L (ref 98–111)
Creatinine: 2.1 mg/dL — ABNORMAL HIGH (ref 0.44–1.00)
GFR, Est AFR Am: 26 mL/min — ABNORMAL LOW (ref >60)
GFR, Estimated: 23 mL/min — ABNORMAL LOW (ref >60)
Glucose, Bld: 214 mg/dL — ABNORMAL HIGH (ref 70–99)
Potassium: 3.9 mmol/L (ref 3.5–5.1)
Sodium: 139 mmol/L (ref 135–145)
Total Bilirubin: 0.5 mg/dL (ref 0.3–1.2)
Total Protein: 5.6 g/dL — ABNORMAL LOW (ref 6.5–8.1)

## 2019-01-02 LAB — ABO/RH: ABO/RH(D): O POS

## 2019-01-02 MED ORDER — DIPHENHYDRAMINE HCL 25 MG PO CAPS
25.0000 mg | ORAL_CAPSULE | Freq: Once | ORAL | Status: AC
  Administered 2019-01-02: 25 mg via ORAL

## 2019-01-02 MED ORDER — ACETAMINOPHEN 325 MG PO TABS
ORAL_TABLET | ORAL | Status: AC
  Filled 2019-01-02: qty 2

## 2019-01-02 MED ORDER — DIPHENHYDRAMINE HCL 25 MG PO CAPS
ORAL_CAPSULE | ORAL | Status: AC
  Filled 2019-01-02: qty 1

## 2019-01-02 MED ORDER — SODIUM CHLORIDE 0.9% IV SOLUTION
250.0000 mL | Freq: Once | INTRAVENOUS | Status: AC
  Administered 2019-01-02: 250 mL via INTRAVENOUS
  Filled 2019-01-02: qty 250

## 2019-01-02 MED ORDER — ACETAMINOPHEN 325 MG PO TABS
650.0000 mg | ORAL_TABLET | Freq: Once | ORAL | Status: AC
  Administered 2019-01-02: 650 mg via ORAL

## 2019-01-02 NOTE — Patient Instructions (Signed)
Coronavirus (COVID-19) Are you at risk? ° °Are you at risk for the Coronavirus (COVID-19)? ° °To be considered HIGH RISK for Coronavirus (COVID-19), you have to meet the following criteria: ° °• Traveled to [place], [place], [place], [place] or [place]; or in the [place] to [place], [place], [place], or [place]; and have fever, cough, and shortness of breath within the last 2 weeks of travel OR °• Been in close contact with a person diagnosed with COVID-19 within the last 2 weeks and have fever, cough, and shortness of breath °• IF YOU DO NOT MEET THESE CRITERIA, YOU ARE CONSIDERED LOW RISK FOR COVID-19. ° °What to do if you are HIGH RISK for COVID-19? ° °• If you are having a medical emergency, call 911. °• Seek medical care right away. Before you go to a doctor’s office, urgent care or emergency department, call ahead and tell them about your recent travel, contact with someone diagnosed with COVID-19, and your symptoms. You should receive instructions from your physician’s office regarding next steps of care.  °• When you arrive at healthcare provider, tell the healthcare staff immediately you have returned from visiting [place], [place], [place], [place] or [place]; or traveled in the [place] to [place], [place], [place], or [place]; in the last two weeks or you have been in close contact with a person diagnosed with COVID-19 in the last 2 weeks.   °• Tell the health care staff about your symptoms: fever, cough and shortness of breath. °• After you have been seen by a medical provider, you will be either: °o Tested for (COVID-19) and discharged home on quarantine except to seek medical care if symptoms worsen, and asked to  °- Stay home and avoid contact with others until you get your results (4-5 days)  °- Avoid travel on public transportation if possible (such as bus, train, or airplane) or °o Sent to the Emergency Department by EMS for evaluation, COVID-19 testing, and possible  admission depending on your condition and test results. ° °What to do if you are LOW RISK for COVID-19? ° °Reduce your risk of any infection by using the same precautions used for avoiding the common cold or flu:  °• Wash your hands often with soap and warm water for at least 20 seconds.  If soap and water are not readily available, use an alcohol-based hand sanitizer with at least 60% alcohol.  °• If coughing or sneezing, cover your mouth and nose by coughing or sneezing into the elbow areas of your shirt or coat, into a tissue or into your sleeve (not your hands). °• Avoid shaking hands with others and consider head nods or verbal greetings only. °• Avoid touching your eyes, nose, or mouth with unwashed hands.  °• Avoid close contact with people who are sick. °• Avoid places or events with large numbers of people in one location, like concerts or sporting events. °• Carefully consider travel plans you have or are making. °• If you are planning any travel outside or inside the [place], visit the CDC’s Travelers’ Health webpage for the latest health notices. °• If you have some symptoms but not all symptoms, continue to monitor at home and seek medical attention if your symptoms worsen. °• If you are having a medical emergency, call 911. ° ° °ADDITIONAL HEALTHCARE OPTIONS FOR PATIENTS ° °[facility] / e-Visit: [email or web address] °        °[facility]: [phone] ° °[facility]   [facility]gent Care: [facility]: [phone]    Blood Transfusion, Adult, Care After This sheet gives you information about how to care for yourself after your procedure. Your health care provider may also give you more specific instructions. If you have problems or questions, contact your health care provider. What can I expect after the procedure? After your procedure, it is common to have:  Bruising and soreness where the IV tube  was inserted.  Headache. Follow these instructions at home:   Take over-the-counter and prescription medicines only as told by your health care provider.  Return to your normal activities as told by your health care provider.  Follow instructions from your health care provider about how to take care of your IV insertion site. Make sure you: ? Wash your hands with soap and water before you change your bandage (dressing). If soap and water are not available, use hand sanitizer. ? Change your dressing as told by your health care provider.  Check your IV insertion site every day for signs of infection. Check for: ? More redness, swelling, or pain. ? More fluid or blood. ? Warmth. ? Pus or a bad smell. Contact a health care provider if:  You have more redness, swelling, or pain around the IV insertion site.  You have more fluid or blood coming from the IV insertion site.  Your IV insertion site feels warm to the touch.  You have pus or a bad smell coming from the IV insertion site.  Your urine turns pink, red, or brown.  You feel weak after doing your normal activities. Get help right away if:  You have signs of a serious allergic or immune system reaction, including: ? Itchiness. ? Hives. ? Trouble breathing. ? Anxiety. ? Chest or lower back pain. ? Fever, flushing, and chills. ? Rapid pulse. ? Rash. ? Diarrhea. ? Vomiting. ? Dark urine. ? Serious headache. ? Dizziness. ? Stiff neck. ? Yellow coloration of the face or the white parts of the eyes (jaundice). This information is not intended to replace advice given to you by your health care provider. Make sure you discuss any questions you have with your health care provider. Document Released: 06/18/2014 Document Revised: 03/25/2017 Document Reviewed: 12/12/2015 Elsevier Patient Education  2020 [employer].

## 2019-01-02 NOTE — Assessment & Plan Note (Signed)
Patient has had longstanding anemia where the hemoglobin was between 10 to 12 g. Since April 2020 her hemoglobin has gotten worse and today it is at 6.6. During the same timeframe her creatinine got from 2.6-2.85 I96 folic acid and iron studies were normal. 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  Plan:  1. We will initiate Aranesp injections starting next week. 2. We will plan for 2 units of blood transfusion today.  I will see her back next week to recheck her CBC and keep a close watch. I discussed with her that if the Aranesp injection does not maintain her hemoglobin then we may have to consider doing a bone marrow biopsy. She tells me that long time ago she had a bone marrow biopsy with Dr. Arnoldo Morale.

## 2019-01-03 LAB — BPAM RBC
Blood Product Expiration Date: 202008212359
Blood Product Expiration Date: 202008212359
ISSUE DATE / TIME: 202007241304
ISSUE DATE / TIME: 202007241304
Unit Type and Rh: 5100
Unit Type and Rh: 5100

## 2019-01-03 LAB — TYPE AND SCREEN
ABO/RH(D): O POS
Antibody Screen: NEGATIVE
Unit division: 0
Unit division: 0

## 2019-01-05 ENCOUNTER — Telehealth: Payer: Self-pay | Admitting: Hematology and Oncology

## 2019-01-05 ENCOUNTER — Other Ambulatory Visit: Payer: Self-pay | Admitting: Hematology and Oncology

## 2019-01-05 NOTE — Telephone Encounter (Signed)
I talk with patient regarding schedule  

## 2019-01-06 ENCOUNTER — Other Ambulatory Visit: Payer: Self-pay | Admitting: Hematology and Oncology

## 2019-01-07 ENCOUNTER — Ambulatory Visit (INDEPENDENT_AMBULATORY_CARE_PROVIDER_SITE_OTHER): Payer: Medicare Other | Admitting: *Deleted

## 2019-01-07 DIAGNOSIS — I5032 Chronic diastolic (congestive) heart failure: Secondary | ICD-10-CM | POA: Diagnosis not present

## 2019-01-07 DIAGNOSIS — I428 Other cardiomyopathies: Secondary | ICD-10-CM

## 2019-01-08 LAB — CUP PACEART REMOTE DEVICE CHECK
Date Time Interrogation Session: 20200729221001
Implantable Pulse Generator Implant Date: 20190322

## 2019-01-08 NOTE — Progress Notes (Signed)
Patient Care Team: Saguier, Iris Pert as PCP - General (Internal Medicine) Alphonsa Overall, MD as Consulting Physician (General Surgery) Nicholas Lose, MD as Consulting Physician (Hematology and Oncology) Gery Pray, MD as Consulting Physician (Radiation Oncology) Evans Lance, MD as Consulting Physician (Cardiology) Collene Gobble, MD as Consulting Physician (Pulmonary Disease) Elayne Snare, MD as Consulting Physician (Endocrinology) Delice Bison, Charlestine Massed, NP as Nurse Practitioner (Hematology and Oncology) Ardelle Balls., MD (Neurology) Izora Gala, MD as Consulting Physician (Otolaryngology) Trula Slade, DPM as Consulting Physician (Podiatry)  DIAGNOSIS:    ICD-10-CM   1. Anemia of chronic disease  D63.8 CBC with Differential (Cancer Center Only)  2. Malignant neoplasm of upper-inner quadrant of right breast in female, estrogen receptor positive ([place])  C50.211    Z17.0     SUMMARY OF ONCOLOGIC HISTORY: Oncology History  Breast cancer of upper-inner quadrant of right female breast ([place])  12/14/2015 Initial Diagnosis   Right breast biopsy 12:30 position: 2 masses, 1.6 cm mass: Invasive ductal carcinoma, grade 2, ER 0%, PR 0%, HER-2 negative, Ki-67 70%; satellite mass 8 mm: IDC grade 2 ER 5%, PR 5%, HER-2 negative, Ki-67 50%; T1cN0 stage IA clinical stage   01/18/2016 Surgery   Right lumpectomy: Multifocal IDC grade 3, 1.9 cm  ER 0%, PR 0%, HER-2 negative, Ki-67 70% and 0.8 cm satellite mass ER 5%, PR 2%, HER-2 negative, Ki-67 50%, high-grade DCIS, margins negative, 0/1 lymph nodes negative T1 cN0 stage IA   02/17/2016 - 04/06/2016 Chemotherapy   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 with above-mentioned history of right breast cancer treated with lumpectomy,adjuvant chemotherapy,radiation, and whois currently on antiestrogen therapywith exemestane. She has a history of anemia, and received 2 units of PRBCs last week. She presents to the clinic today to begin Aranesp injections.  She is feeling a lot better than last week but her hemoglobin today is only 7.8.  She does not have any dizziness or lightheadedness.  Continues to complain of fatigue.  REVIEW OF SYSTEMS:   Constitutional: Denies fevers, chills or abnormal weight loss Eyes: Denies blurriness of vision Ears, nose, mouth, throat, and face: Denies mucositis or sore throat Respiratory: Denies cough, dyspnea or wheezes Cardiovascular: Denies palpitation, chest discomfort Gastrointestinal: Denies nausea, heartburn or change in bowel habits Skin: Denies abnormal skin rashes Lymphatics: Denies new lymphadenopathy or easy bruising Neurological: Denies numbness, tingling or new weaknesses Behavioral/Psych: Mood is stable, no new changes  Extremities: No lower extremity edema Breast: denies any pain or lumps or nodules in either breasts All other systems were reviewed with the patient and are negative.  I have reviewed the past medical history, past surgical history, social history and family history with the patient and they are unchanged from previous note.  ALLERGIES:  is allergic to amoxicillin; aldactone [spironolactone]; imdur [isosorbide dinitrate]; tape; arimidex [anastrozole]; latex; and tetracycline.  MEDICATIONS:  Current Outpatient Medications  Medication Sig Dispense Refill  .  ACCU-CHEK GUIDE test strip Use to test blood sugar 3  times daily 100 each 3  . acetaminophen (TYLENOL) 325 MG tablet Take 2 tablets (650 mg total) by mouth every 6 (six) hours as needed for mild pain (or Fever >/= 101).    Marland Kitchen albuterol (PROVENTIL HFA;VENTOLIN HFA) 108 (90 Base) MCG/ACT inhaler Inhale 2 puffs into the lungs every 6 (six) hours as needed for wheezing or shortness of breath.    Marland Kitchen amiodarone (PACERONE) 200 MG tablet Take 200 mg by mouth daily.    . budesonide-formoterol (SYMBICORT) 160-4.5 MCG/ACT inhaler INHALE TWO PUFFS INTO THE LUNGS TWICE DAILY 3 Inhaler 1  . ELIQUIS 5 MG TABS tablet TAKE ONE TABLET BY MOUTH TWICE DAILY 60 tablet 6  . exemestane (AROMASIN) 25 MG tablet TAKE ONE TABLET BY MOUTH DAILY AFTER BREAKFAST 90 tablet 3  . ezetimibe (ZETIA) 10 MG tablet TAKE ONE TABLET BY MOUTH EVERY DAY 90 tablet 3  . famotidine (PEPCID) 20 MG tablet One after supper 30 tablet 11  . fludrocortisone (FLORINEF) 0.1 MG tablet Take 1 tablet (0.1 mg total) by mouth daily.    . insulin glargine (LANTUS) 100 UNIT/ML injection Inject 16 units under the skin once daily in the morning. 20 mL 3  . insulin lispro (HUMALOG) 100 UNIT/ML injection Inject 4-6 units under the skin three times daily before meals. 20 mL 3  . Insulin Pen Needle 32G X 4 MM MISC Use as instructed to inject insulin 4 times daily. 400 each 3  . loratadine (CLARITIN) 10 MG tablet Take 10 mg by mouth daily.    . metoprolol tartrate (LOPRESSOR) 25 MG tablet Take 1 tablet (25 mg total) by mouth 2 (two) times daily. 180 tablet 3  . nitroGLYCERIN (NITROSTAT) 0.4 MG SL tablet Place 1 tablet (0.4 mg total) under the tongue every 5 (five) minutes as needed for chest pain. 30 tablet 3  . pantoprazole (PROTONIX) 40 MG tablet Take 1 tablet (40 mg total) by mouth daily. 30 tablet 11  . rosuvastatin (CRESTOR) 40 MG tablet Take 1 tablet (40 mg total) by mouth at bedtime. 90 tablet 3  . zolpidem (AMBIEN) 5 MG tablet TAKE ONE TABLET BY MOUTH AT BEDTIME AS NEEDED FOR SLEEP 30 tablet 2   No current  facility-administered medications for this visit.     PHYSICAL EXAMINATION: ECOG PERFORMANCE STATUS: 1 - Symptomatic but completely ambulatory  Vitals:   01/09/19 1009  BP: (!) 111/40  Pulse: 61  Resp: 16  Temp: 98 F (36.7 C)  SpO2: 100%   Filed Weights   01/09/19 1009  Weight: 169 lb 14.4 oz (77.1 kg)    GENERAL: alert, no distress and comfortable SKIN: skin color, texture, turgor are normal, no rashes or significant lesions EYES: normal, Conjunctiva are pink and non-injected, sclera clear OROPHARYNX: no exudate, no erythema and lips, buccal mucosa, and tongue normal  NECK: supple, thyroid normal size, non-tender, without nodularity LYMPH: no palpable lymphadenopathy in the cervical, axillary or inguinal LUNGS: clear to auscultation and percussion with normal breathing effort HEART: regular rate & rhythm and no murmurs and no lower extremity edema ABDOMEN: abdomen soft, non-tender and normal bowel sounds MUSCULOSKELETAL: no cyanosis of digits and no clubbing  NEURO: alert & oriented x 3 with fluent speech, no focal motor/sensory deficits EXTREMITIES: No lower extremity edema  LABORATORY DATA:  I have reviewed the data as listed CMP Latest Ref Rng & Units 01/02/2019 12/24/2018 12/09/2018  Glucose 70 - 99 mg/dL 214(H) 114(H) 119(H)  BUN 8 - 23 mg/dL 44(H) 31(H) 34(H)  Creatinine 0.44 - 1.00 mg/dL 2.10(H) 2.44(H) 2.02(H)  Sodium 135 - 145 mmol/L 139 139 140  Potassium 3.5 - 5.1 mmol/L 3.9 4.5 4.7  Chloride 98 - 111 mmol/L 108 104 106  CO2 22 - 32 mmol/L _0 Calcium 8.9 - 10.3 mg/dL 8.4(L) 8.9 8.8  Total Protein 6.5 - 8.1 g/dL 5.6(L) - -  Total Bilirubin 0.3 - 1.2 mg/dL 0.5 - -  Alkaline Phos 38 - 126 U/L 57 - -  AST 15 - 41 U/L 37 - -  ALT 0 - 44 U/L 25 - -    Lab Results  Component Value Date   WBC 5.6 01/02/2019   HGB 6.6 (LL) 01/02/2019   HCT 21.8 (L) 01/02/2019   MCV 104.3 (H) 01/02/2019   PLT 104 (L) 01/02/2019   NEUTROABS 4.3 01/02/2019     ASSESSMENT & PLAN:  Breast cancer of upper-inner quadrant of right female breast ([place]) Patient has had longstanding anemia where the hemoglobin was between 10 to 12 g. Since April 2020 her hemoglobin has gotten worse and today it is at 6.6. During the same timeframe her creatinine got from 9.1-6.94 H03 folic acid and iron studies were normal. 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  Plan:  1. We will initiate Aranesp injections today 01/09/2019 Hold off on blood transfusion today. We will see what her labs are next week to decide on blood transfusion.  I will see her back next week to recheck her CBC and keep a close watch. I discussed with her that if the Aranesp injection does not maintain her hemoglobin then we may have to consider doing a bone marrow biopsy. She tells me that long time ago she had a bone marrow biopsy with Dr. Arnoldo Morale.  Breast cancer: Doing well with exemestane.    Orders Placed This Encounter  Procedures  . CBC with Differential (Cancer Center Only)    Standing Status:   Standing    Number of Occurrences:   20    Standing Expiration Date:   01/09/2020   The patient has a good understanding of the overall plan. she agrees with it. she will call with any problems that may develop before the next visit here.  Nicholas Lose, MD 01/09/2019  Julious Oka Dorshimer am acting as scribe for Dr. Nicholas Lose.  I have reviewed the above documentation for accuracy and completeness, and I agree with the above.

## 2019-01-09 ENCOUNTER — Other Ambulatory Visit: Payer: Self-pay

## 2019-01-09 ENCOUNTER — Inpatient Hospital Stay: Payer: Medicare Other

## 2019-01-09 ENCOUNTER — Inpatient Hospital Stay (HOSPITAL_BASED_OUTPATIENT_CLINIC_OR_DEPARTMENT_OTHER): Payer: Medicare Other | Admitting: Hematology and Oncology

## 2019-01-09 VITALS — BP 111/40 | HR 61 | Temp 98.0°F | Resp 16 | Ht 65.0 in | Wt 169.9 lb

## 2019-01-09 DIAGNOSIS — Z923 Personal history of irradiation: Secondary | ICD-10-CM

## 2019-01-09 DIAGNOSIS — Z170 Estrogen receptor positive status [ER+]: Secondary | ICD-10-CM | POA: Diagnosis not present

## 2019-01-09 DIAGNOSIS — Z79899 Other long term (current) drug therapy: Secondary | ICD-10-CM | POA: Diagnosis not present

## 2019-01-09 DIAGNOSIS — D638 Anemia in other chronic diseases classified elsewhere: Secondary | ICD-10-CM

## 2019-01-09 DIAGNOSIS — Z794 Long term (current) use of insulin: Secondary | ICD-10-CM

## 2019-01-09 DIAGNOSIS — Z9221 Personal history of antineoplastic chemotherapy: Secondary | ICD-10-CM

## 2019-01-09 DIAGNOSIS — D649 Anemia, unspecified: Secondary | ICD-10-CM

## 2019-01-09 DIAGNOSIS — R5383 Other fatigue: Secondary | ICD-10-CM

## 2019-01-09 DIAGNOSIS — Z7901 Long term (current) use of anticoagulants: Secondary | ICD-10-CM

## 2019-01-09 DIAGNOSIS — C50211 Malignant neoplasm of upper-inner quadrant of right female breast: Secondary | ICD-10-CM | POA: Diagnosis not present

## 2019-01-09 DIAGNOSIS — R0602 Shortness of breath: Secondary | ICD-10-CM

## 2019-01-09 DIAGNOSIS — D509 Iron deficiency anemia, unspecified: Secondary | ICD-10-CM

## 2019-01-09 DIAGNOSIS — Z79811 Long term (current) use of aromatase inhibitors: Secondary | ICD-10-CM

## 2019-01-09 LAB — CBC WITH DIFFERENTIAL (CANCER CENTER ONLY)
Abs Immature Granulocytes: 0.02 10*3/uL (ref 0.00–0.07)
Basophils Absolute: 0.1 10*3/uL (ref 0.0–0.1)
Basophils Relative: 1 %
Eosinophils Absolute: 0.4 10*3/uL (ref 0.0–0.5)
Eosinophils Relative: 7 %
HCT: 25.1 % — ABNORMAL LOW (ref 36.0–46.0)
Hemoglobin: 7.8 g/dL — ABNORMAL LOW (ref 12.0–15.0)
Immature Granulocytes: 0 %
Lymphocytes Relative: 12 %
Lymphs Abs: 0.7 10*3/uL (ref 0.7–4.0)
MCH: 32.8 pg (ref 26.0–34.0)
MCHC: 31.1 g/dL (ref 30.0–36.0)
MCV: 105.5 fL — ABNORMAL HIGH (ref 80.0–100.0)
Monocytes Absolute: 0.7 10*3/uL (ref 0.1–1.0)
Monocytes Relative: 11 %
Neutro Abs: 3.9 10*3/uL (ref 1.7–7.7)
Neutrophils Relative %: 69 %
Platelet Count: 115 10*3/uL — ABNORMAL LOW (ref 150–400)
RBC: 2.38 MIL/uL — ABNORMAL LOW (ref 3.87–5.11)
RDW: 14.4 % (ref 11.5–15.5)
WBC Count: 5.7 10*3/uL (ref 4.0–10.5)
nRBC: 0 % (ref 0.0–0.2)

## 2019-01-09 LAB — RETICULOCYTES
Immature Retic Fract: 13.3 % (ref 2.3–15.9)
RBC.: 2.43 MIL/uL — ABNORMAL LOW (ref 3.87–5.11)
Retic Count, Absolute: 70.2 10*3/uL (ref 19.0–186.0)
Retic Ct Pct: 2.9 % (ref 0.4–3.1)

## 2019-01-09 MED ORDER — EPOETIN ALFA-EPBX 40000 UNIT/ML IJ SOLN
40000.0000 [IU] | Freq: Once | INTRAMUSCULAR | Status: AC
  Administered 2019-01-09: 11:00:00 40000 [IU] via SUBCUTANEOUS
  Filled 2019-01-09: qty 1

## 2019-01-09 NOTE — Patient Instructions (Signed)

## 2019-01-09 NOTE — Assessment & Plan Note (Signed)
Patient has had longstanding anemia where the hemoglobin was between 10 to 12 g. Since April 2020 her hemoglobin has gotten worse and today it is at 6.6. During the same timeframe her creatinine got from 4.8-3.01 F99 folic acid and iron studies were normal. 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  Plan:  1. We will initiate Aranesp injections starting next week. 2. We will plan for 2 units of blood transfusion today.  I will see her back next week to recheck her CBC and keep a close watch. I discussed with her that if the Aranesp injection does not maintain her hemoglobin then we may have to consider doing a bone marrow biopsy. She tells me that long time ago she had a bone marrow biopsy with Dr. Arnoldo Morale.  Breast cancer: Doing well with exemestane.

## 2019-01-10 LAB — ERYTHROPOIETIN: Erythropoietin: 24.1 m[IU]/mL — ABNORMAL HIGH (ref 2.6–18.5)

## 2019-01-11 ENCOUNTER — Encounter: Payer: Self-pay | Admitting: Hematology and Oncology

## 2019-01-13 ENCOUNTER — Telehealth: Payer: Self-pay

## 2019-01-13 ENCOUNTER — Telehealth: Payer: Self-pay | Admitting: Hematology and Oncology

## 2019-01-13 ENCOUNTER — Ambulatory Visit (INDEPENDENT_AMBULATORY_CARE_PROVIDER_SITE_OTHER): Payer: Medicare Other

## 2019-01-13 ENCOUNTER — Other Ambulatory Visit: Payer: Self-pay

## 2019-01-13 ENCOUNTER — Ambulatory Visit (INDEPENDENT_AMBULATORY_CARE_PROVIDER_SITE_OTHER): Payer: Medicare Other | Admitting: Emergency Medicine

## 2019-01-13 ENCOUNTER — Encounter: Payer: Self-pay | Admitting: Emergency Medicine

## 2019-01-13 VITALS — BP 134/68 | HR 64 | Ht 65.0 in | Wt 169.0 lb

## 2019-01-13 DIAGNOSIS — J4520 Mild intermittent asthma, uncomplicated: Secondary | ICD-10-CM

## 2019-01-13 DIAGNOSIS — J90 Pleural effusion, not elsewhere classified: Secondary | ICD-10-CM

## 2019-01-13 NOTE — Telephone Encounter (Signed)
I left a message regarding schedule  

## 2019-01-13 NOTE — Patient Instructions (Addendum)
CXR today. Suspect your pleural effusion has returned.  We will refer you back to cardiology to fine-tune your diuretic medications, balance these with your kidney function.  If your breathing worsens or if your pleural fluid increases then we may need to consider a repeat thoracentesis.  Continue your Symbicort and albuterol as you are taking them  Please follow up with APP here in 2 weeks.

## 2019-01-13 NOTE — Telephone Encounter (Signed)
Covid-19 screening questions   Do you now or have you had a fever in the last 14 days? No  Do you have any respiratory symptoms of shortness of breath or cough now or in the last 14 days? No  Do you have any family members or close contacts with diagnosed or suspected Covid-19 in the past 14 days? No  Have you been tested for Covid-19 and found to be positive? Was tested in June and found to be negative

## 2019-01-13 NOTE — Assessment & Plan Note (Signed)
Continue your Symbicort and albuterol as you are taking them  Please follow up with APP here in 2 weeks

## 2019-01-13 NOTE — Progress Notes (Signed)
  Subjective:    Patient ID: Hannah [name], female    DOB: [birth year], 73 y.o.   MRN: [id number]           ROV 01/13/2019 --73 year old obese woman with a history of CAD/CABG, AVR, A. fib on amiodarone, breast cancer.  She also has a history of a right transudate of pleural effusion (cytology negative).  There was no significant accumulation when I saw her 6 weeks ago.  She returns today reporting that she is having orthopnea, increased exertional dyspnea. She has been off diuretics for the last 3 weeks - was having progressive renal insufficiency (was on lasix, aldactone). Has gained 9 lbs and is having some increase LE edema.  She remains on Symbicort bid. Rare albuterol use.                                        Review of Systems as per HPI    Objective:   Physical Exam Vitals:   01/13/19 1517  BP: 134/68  Pulse: 64  SpO2: 99%  Weight: 169 lb (76.7 kg)  Height: 5\' 5"  (1.651 m)   Gen: Pleasant, obese, in no distress,  normal affect  ENT: No lesions,  mouth clear,  oropharynx clear, no postnasal drip  Neck: No JVD, no stridor  Lungs: No use of accessory muscles, decrease BS at R base, clear on L  Cardiovascular: irreg irreg, click present  Musculoskeletal: No deformities, no cyanosis or clubbing  Neuro: alert, non focal  Skin: Warm, no lesions or rashes   Assessment & Plan:  Asthma Continue your Symbicort and albuterol as you are taking them  Please follow up with APP here in 2 weeks  Pleural effusion on right Her exam and clinical history are consistent with reaccumulation of her right pleural effusion.  She did not tolerate Aldactone due to renal insufficiency.  Likely needs to be on a more moderate dose of scheduled diuretic.  I will ask cardiology to help with management of this.  CXR today. Suspect your pleural effusion has returned.  We will refer you back to cardiology to fine-tune your diuretic medications, balance these with your kidney function.  If your  breathing worsens or if your pleural fluid increases then we may need to consider a repeat thoracentesis.   Baltazar Apo, MD, PhD 01/13/2019, 3:44 PM [facility] [phone] or if no answer [phone]

## 2019-01-13 NOTE — Assessment & Plan Note (Signed)
Her exam and clinical history are consistent with reaccumulation of her right pleural effusion.  She did not tolerate Aldactone due to renal insufficiency.  Likely needs to be on a more moderate dose of scheduled diuretic.  I will ask cardiology to help with management of this.  CXR today. Suspect your pleural effusion has returned.  We will refer you back to cardiology to fine-tune your diuretic medications, balance these with your kidney function.  If your breathing worsens or if your pleural fluid increases then we may need to consider a repeat thoracentesis.

## 2019-01-14 ENCOUNTER — Telehealth: Payer: Self-pay | Admitting: *Deleted

## 2019-01-14 ENCOUNTER — Ambulatory Visit: Payer: Medicare Other | Admitting: Gastroenterology

## 2019-01-14 ENCOUNTER — Telehealth: Payer: Self-pay | Admitting: Emergency Medicine

## 2019-01-14 DIAGNOSIS — Z79899 Other long term (current) drug therapy: Secondary | ICD-10-CM

## 2019-01-14 DIAGNOSIS — J90 Pleural effusion, not elsewhere classified: Secondary | ICD-10-CM

## 2019-01-14 MED ORDER — FUROSEMIDE 20 MG PO TABS: ORAL_TABLET | ORAL | 3 refills | Status: DC

## 2019-01-14 NOTE — Telephone Encounter (Signed)
Pt calling in today to clarify instructions that were discussed with pt earlier. Sent in new script of lasix (20 mg) to requested pharmacy.

## 2019-01-14 NOTE — Telephone Encounter (Signed)
Noted recent hematology visit as well - marked anemia - HGB down to 6.6. Now with recurrent effusion - to start diuretics by cardiology per pulmonary.   Most likely her anemia is also contributing factor to her dyspnea.   Will need follow up CXR - may need to consider CT +/- repeat thoracentesis.   Starting lasix. Will need to watch for recurrent orthostasis. Overall, still worrisome situation.   Plan as outlined.   [name], RN, [place] [address] [place], [place]  [id number] [phone]

## 2019-01-14 NOTE — Telephone Encounter (Signed)
Pt is requesting results from her CXR done yesterday. RB was going to make a decision about if the pt needed further medications to treat her pleural effusion based on her CXR. RB is not available for the rest of the week.  Tonya - please advise. Thanks!

## 2019-01-14 NOTE — Telephone Encounter (Signed)
Chest X ray showed small right pleural effusion slightly increased from the prior study. It looks like Dr. Lamonte Sakai has already advised her to have close follow up with cardiology to adjust diuretics.

## 2019-01-14 NOTE — Telephone Encounter (Signed)
Pt calling in today due to pt seen Dr.Byrum and doctor ordered CXR pleural effusion is back on right side and Dr.Byrum wanted cardiology to take care of pt's diuretics.  Pt will start lasix two tablets (40 mg ) X 3 days than pt will go to one tablet (20 mg) daily. Pt is to watch bp and wear support stockings, make sure pt stays on florinef.   Pt will come in on Monday, August 10 for labs; bmet/cbc.lft/tsh and CXR.  Pt has upcoming appt with Truitt Merle, NP on August 19.  This was advised with [name]. Will send to [facility] to addend.

## 2019-01-14 NOTE — Telephone Encounter (Signed)
Spoke with pt. She is aware of Tonya's response. Nothing further was needed.

## 2019-01-15 NOTE — Progress Notes (Signed)
Patient Care Team: Saguier, Iris Pert as PCP - General (Internal Medicine) Alphonsa Overall, MD as Consulting Physician (General Surgery) Nicholas Lose, MD as Consulting Physician (Hematology and Oncology) Gery Pray, MD as Consulting Physician (Radiation Oncology) Evans Lance, MD as Consulting Physician (Cardiology) Collene Gobble, MD as Consulting Physician (Pulmonary Disease) Elayne Snare, MD as Consulting Physician (Endocrinology) Delice Bison, Charlestine Massed, NP as Nurse Practitioner (Hematology and Oncology) Ardelle Balls., MD (Neurology) Izora Gala, MD as Consulting Physician (Otolaryngology) Trula Slade, DPM as Consulting Physician (Podiatry)  DIAGNOSIS:    ICD-10-CM   1. Anemia of chronic disease  D63.8     SUMMARY OF ONCOLOGIC HISTORY: Oncology History  Breast cancer of upper-inner quadrant of right female breast ([facility])  12/14/2015 Initial Diagnosis   Right breast biopsy 12:30 position: 2 masses, 1.6 cm mass: Invasive ductal carcinoma, grade 2, ER 0%, PR 0%, HER-2 negative, Ki-67 70%; satellite mass 8 mm: IDC grade 2 ER 5%, PR 5%, HER-2 negative, Ki-67 50%; T1cN0 stage IA clinical stage   01/18/2016 Surgery   Right lumpectomy: Multifocal IDC grade 3, 1.9 cm  ER 0%, PR 0%, HER-2 negative, Ki-67 70% and 0.8 cm satellite mass ER 5%, PR 2%, HER-2 negative, Ki-67 50%, high-grade DCIS, margins negative, 0/1 lymph nodes negative T1 cN0 stage IA   02/17/2016 - 04/06/2016 Chemotherapy   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 with above-mentioned history of right breast cancer treated with lumpectomy,adjuvant chemotherapy,radiation, and whois currently on antiestrogen therapywith exemestane. She also has a history of anemia, and received 2 units of PRBCs 2 weeks ago and began Aranesp injections last week. She presents to the clinic today for Aranesp and to review her labs.  REVIEW OF SYSTEMS:   Constitutional: Complains of fatigue, pallor  Eyes: Denies blurriness of vision Ears, nose, mouth, throat, and face: Denies mucositis or sore throat Respiratory: Denies cough, dyspnea or wheezes Cardiovascular: Denies palpitation, chest discomfort Gastrointestinal: Denies nausea, heartburn or change in bowel habits Skin: Denies abnormal skin rashes Lymphatics: Denies new lymphadenopathy or easy bruising Neurological: Denies numbness, tingling or new weaknesses Behavioral/Psych: Mood is stable, no new changes  Extremities: No lower extremity edema Breast: denies any pain or lumps or nodules in either breasts All other systems were reviewed with the patient and are negative.  I have reviewed the past medical history, past surgical history, social history and family history with the patient and they are unchanged from previous note.  ALLERGIES:  is allergic to amoxicillin; aldactone [spironolactone]; imdur [isosorbide dinitrate]; tape; arimidex [anastrozole]; latex; and tetracycline.  MEDICATIONS:  Current Outpatient Medications  Medication Sig Dispense Refill  . ACCU-CHEK GUIDE test strip Use to test blood sugar 3 times daily 100 each 3  . acetaminophen (TYLENOL) 325 MG tablet Take 2 tablets (650 mg total) by mouth every 6 (six) hours as needed for mild pain (or Fever >/= 101).    Marland Kitchen albuterol (PROVENTIL HFA;VENTOLIN  HFA) 108 (90 Base) MCG/ACT inhaler Inhale 2 puffs into the lungs every 6 (six) hours as needed  for wheezing or shortness of breath.    Marland Kitchen amiodarone (PACERONE) 200 MG tablet Take 200 mg by mouth daily.    . budesonide-formoterol (SYMBICORT) 160-4.5 MCG/ACT inhaler INHALE TWO PUFFS INTO THE LUNGS TWICE DAILY 3 Inhaler 1  . ELIQUIS 5 MG TABS tablet TAKE ONE TABLET BY MOUTH TWICE DAILY 60 tablet 6  . exemestane (AROMASIN) 25 MG tablet TAKE ONE TABLET BY MOUTH DAILY AFTER BREAKFAST 90 tablet 3  . ezetimibe (ZETIA) 10 MG tablet TAKE ONE TABLET BY MOUTH EVERY DAY 90 tablet 3  . famotidine (PEPCID) 20 MG tablet One after supper 30 tablet 11  . fludrocortisone (FLORINEF) 0.1 MG tablet Take 1 tablet (0.1 mg total) by mouth daily.    . furosemide (LASIX) 20 MG tablet Two tablets (40 mg ) X 3 days, than one tablet (20 mg ) daily. 90 tablet 3  . insulin glargine (LANTUS) 100 UNIT/ML injection Inject 16 units under the skin once daily in the morning. 20 mL 3  . insulin lispro (HUMALOG) 100 UNIT/ML injection Inject 4-6 units under the skin three times daily before meals. 20 mL 3  . Insulin Pen Needle 32G X 4 MM MISC Use as instructed to inject insulin 4 times daily. 400 each 3  . loratadine (CLARITIN) 10 MG tablet Take 10 mg by mouth daily.    . metoprolol tartrate (LOPRESSOR) 25 MG tablet Take 1 tablet (25 mg total) by mouth 2 (two) times daily. 180 tablet 3  . nitroGLYCERIN (NITROSTAT) 0.4 MG SL tablet Place 1 tablet (0.4 mg total) under the tongue every 5 (five) minutes as needed for chest pain. 30 tablet 3  . pantoprazole (PROTONIX) 40 MG tablet Take 1 tablet (40 mg total) by mouth daily. 30 tablet 11  . rosuvastatin (CRESTOR) 40 MG tablet Take 1 tablet (40 mg total) by mouth at bedtime. 90 tablet 3  . zolpidem (AMBIEN) 5 MG tablet TAKE ONE TABLET BY MOUTH AT BEDTIME AS NEEDED FOR SLEEP 30 tablet 2   No current facility-administered medications for this visit.     PHYSICAL EXAMINATION: ECOG PERFORMANCE STATUS: 2 - Symptomatic, <50% confined to bed  Vitals:   01/16/19 0921  BP: (!) 125/41   Pulse: 63  Resp: 16  Temp: 98.3 F (36.8 C)  SpO2: 100%   Filed Weights   01/16/19 0921  Weight: 166 lb 4 oz (75.4 kg)    GENERAL: alert, no distress and comfortable SKIN: skin color, texture, turgor are normal, no rashes or significant lesions EYES: normal, Conjunctiva are pink and non-injected, sclera clear OROPHARYNX: no exudate, no erythema and lips, buccal mucosa, and tongue normal  NECK: supple, thyroid normal size, non-tender, without nodularity LYMPH: no palpable lymphadenopathy in the cervical, axillary or inguinal LUNGS: clear to auscultation and percussion with normal breathing effort HEART: regular rate & rhythm and no murmurs and no lower extremity edema ABDOMEN: abdomen soft, non-tender and normal bowel sounds MUSCULOSKELETAL: no cyanosis of digits and no clubbing  NEURO: alert & oriented x 3 with fluent speech, no focal motor/sensory deficits EXTREMITIES: No lower extremity edema  LABORATORY DATA:  I have reviewed the data as listed CMP Latest Ref Rng & Units 01/02/2019 12/24/2018 12/09/2018  Glucose 70 - 99 mg/dL 214(H) 114(H) 119(H)  BUN 8 - 23 mg/dL 44(H) 31(H) 34(H)  Creatinine 0.44 - 1.00 mg/dL 2.10(H) 2.44(H) 2.02(H)  Sodium 135 - 145 mmol/L  139 139 140  Potassium 3.5 - 5.1 mmol/L 3.9 4.5 4.7  Chloride 98 - 111 mmol/L 108 104 106  CO2 22 - 32 mmol/L 25 23 23   Calcium 8.9 - 10.3 mg/dL 8.4(L) 8.9 8.8  Total Protein 6.5 - 8.1 g/dL 5.6(L) - -  Total Bilirubin 0.3 - 1.2 mg/dL 0.5 - -  Alkaline Phos 38 - 126 U/L 57 - -  AST 15 - 41 U/L 37 - -  ALT 0 - 44 U/L 25 - -    Lab Results  Component Value Date   WBC 5.3 01/16/2019   HGB 8.2 (L) 01/16/2019   HCT 27.4 (L) 01/16/2019   MCV 107.9 (H) 01/16/2019   PLT 122 (L) 01/16/2019   NEUTROABS 3.8 01/16/2019    ASSESSMENT & PLAN:  Anemia of chronic disease Patient has had longstanding anemia where the hemoglobin was between 10 to 12 g. Since April 2020 her hemoglobin has gotten worse and today it is at 6.6.  During the same timeframe her creatinine got from 7.3-5.67 O14 folic acid and iron studies were normal. 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  Current treatment:Retacrit injections weekly started 01/09/2019 Lab review: Hemoglobin is 8.2, MCV 107.9  Return to clinic weekly for retacrit injections. I discussed with her that if the retacrit injection does not maintain her hemoglobin then we may have to consider doing a bone marrow biopsy. She tells me that long time ago she had a bone marrow biopsy with Dr. Arnoldo Morale.   Breast cancer: Doing well with exemestane.  Continue to closely monitor her blood counts.  Next week she is going to [place] for a cabin vacation.  She will not receive an injection next week.  She will come back the following week for the third injection.  No orders of the defined types were placed in this encounter.  The patient has a good understanding of the overall plan. she agrees with it. she will call with any problems that may develop before the next visit here.  Nicholas Lose, MD 01/16/2019  Julious Oka Dorshimer am acting as scribe for Dr. Nicholas Lose.  I have reviewed the above documentation for accuracy and completeness, and I agree with the above.

## 2019-01-16 ENCOUNTER — Inpatient Hospital Stay: Payer: Medicare Other | Attending: Hematology and Oncology | Admitting: Hematology and Oncology

## 2019-01-16 ENCOUNTER — Inpatient Hospital Stay: Payer: Medicare Other

## 2019-01-16 ENCOUNTER — Other Ambulatory Visit: Payer: Self-pay

## 2019-01-16 ENCOUNTER — Telehealth: Payer: Self-pay

## 2019-01-16 DIAGNOSIS — D638 Anemia in other chronic diseases classified elsewhere: Secondary | ICD-10-CM

## 2019-01-16 DIAGNOSIS — K2970 Gastritis, unspecified, without bleeding: Secondary | ICD-10-CM

## 2019-01-16 DIAGNOSIS — Z79899 Other long term (current) drug therapy: Secondary | ICD-10-CM | POA: Insufficient documentation

## 2019-01-16 DIAGNOSIS — Z7901 Long term (current) use of anticoagulants: Secondary | ICD-10-CM | POA: Insufficient documentation

## 2019-01-16 DIAGNOSIS — Z794 Long term (current) use of insulin: Secondary | ICD-10-CM | POA: Insufficient documentation

## 2019-01-16 DIAGNOSIS — Z170 Estrogen receptor positive status [ER+]: Secondary | ICD-10-CM | POA: Insufficient documentation

## 2019-01-16 DIAGNOSIS — Z923 Personal history of irradiation: Secondary | ICD-10-CM | POA: Insufficient documentation

## 2019-01-16 DIAGNOSIS — R7989 Other specified abnormal findings of blood chemistry: Secondary | ICD-10-CM

## 2019-01-16 DIAGNOSIS — R1013 Epigastric pain: Secondary | ICD-10-CM

## 2019-01-16 DIAGNOSIS — C50211 Malignant neoplasm of upper-inner quadrant of right female breast: Secondary | ICD-10-CM | POA: Insufficient documentation

## 2019-01-16 DIAGNOSIS — D649 Anemia, unspecified: Secondary | ICD-10-CM | POA: Insufficient documentation

## 2019-01-16 DIAGNOSIS — Z79811 Long term (current) use of aromatase inhibitors: Secondary | ICD-10-CM | POA: Insufficient documentation

## 2019-01-16 DIAGNOSIS — R634 Abnormal weight loss: Secondary | ICD-10-CM

## 2019-01-16 DIAGNOSIS — Z9221 Personal history of antineoplastic chemotherapy: Secondary | ICD-10-CM | POA: Insufficient documentation

## 2019-01-16 LAB — CBC WITH DIFFERENTIAL (CANCER CENTER ONLY)
Abs Immature Granulocytes: 0.02 10*3/uL (ref 0.00–0.07)
Basophils Absolute: 0.1 10*3/uL (ref 0.0–0.1)
Basophils Relative: 1 %
Eosinophils Absolute: 0.2 10*3/uL (ref 0.0–0.5)
Eosinophils Relative: 4 %
HCT: 27.4 % — ABNORMAL LOW (ref 36.0–46.0)
Hemoglobin: 8.2 g/dL — ABNORMAL LOW (ref 12.0–15.0)
Immature Granulocytes: 0 %
Lymphocytes Relative: 11 %
Lymphs Abs: 0.6 10*3/uL — ABNORMAL LOW (ref 0.7–4.0)
MCH: 32.3 pg (ref 26.0–34.0)
MCHC: 29.9 g/dL — ABNORMAL LOW (ref 30.0–36.0)
MCV: 107.9 fL — ABNORMAL HIGH (ref 80.0–100.0)
Monocytes Absolute: 0.7 10*3/uL (ref 0.1–1.0)
Monocytes Relative: 12 %
Neutro Abs: 3.8 10*3/uL (ref 1.7–7.7)
Neutrophils Relative %: 72 %
Platelet Count: 122 10*3/uL — ABNORMAL LOW (ref 150–400)
RBC: 2.54 MIL/uL — ABNORMAL LOW (ref 3.87–5.11)
RDW: 15.2 % (ref 11.5–15.5)
WBC Count: 5.3 10*3/uL (ref 4.0–10.5)
nRBC: 0 % (ref 0.0–0.2)

## 2019-01-16 LAB — SAMPLE TO BLOOD BANK

## 2019-01-16 MED ORDER — EPOETIN ALFA-EPBX 40000 UNIT/ML IJ SOLN
40000.0000 [IU] | Freq: Once | INTRAMUSCULAR | Status: AC
  Administered 2019-01-16: 40000 [IU] via SUBCUTANEOUS
  Filled 2019-01-16: qty 1

## 2019-01-16 NOTE — Patient Instructions (Signed)

## 2019-01-16 NOTE — Telephone Encounter (Signed)
Called and spoke with patient-patient informed of MD recommendations; patient is agreeable with plan of care and reports she is doing lab work for Dr. Lindi Adie and NP-Gerhardt (lab orders in Epic and available for review when completed); Patient scheduled for f/u appt with Dr. Lyndel Safe on 02/02/2019 at 2:10 pm; Patient verbalized understanding of information/instructions;  Patient was advised to call the office at [phone] if questions/concerns arise;

## 2019-01-16 NOTE — Telephone Encounter (Signed)
Lab orders placed in Epic;  Left message for patient to call back to the office to schedule f/u OV and to be informed of requested lab work;

## 2019-01-16 NOTE — Telephone Encounter (Signed)
-----   Message from Jackquline Denmark, MD sent at 01/15/2019  4:40 PM EDT ----- Regarding: RE: EGD need to be scheduled? Hi Bre Lets do CBC, CMP And see her in office Thx  RG  ----- Message ----- From: Mohammed Kindle, RN Sent: 01/12/2019   4:53 PM EDT To: Jackquline Denmark, MD Subject: EGD need to be scheduled?                      Dr. Lyndel Safe, This patient has an upper endo on 06/17/2018.  Please advise if this patient needs to be scheduled for a repeat EGD or if you need to see her in the office first, or what needs to be done for this patient? Please/Thank you Bre ----- Message ----- From: Greggory Keen, LPN Sent: 08/15/6281   2:53 PM EDT To: Mohammed Kindle, RN  Hello Bri Sorry to push this over on you. ----- Message ----- From: Mauri Pole, MD Sent: 01/09/2019   3:28 PM EDT To: Greggory Keen, LPN, Nicholas Lose, MD, #  Dorothea Ogle,  We will take care of it.  Actually Merrie Roof joined our group, his office is in [place] now. I will forward this to him.  Beth, can you please forward to Dr Steve Rattler nurse for an urgent visit. Thanks Margarette Asal ----- Message ----- From: Nicholas Lose, MD Sent: 01/09/2019  10:45 AM EDT To: Mauri Pole, MD  Margarette Asal This is a patient of mine who saw a GI doc in Dec Jackquline Denmark). She says he does not practice anymore. She has severe anemia with black colored stools. SHe needs an upper endoscopy. Can you please see her and arrange? Thanks [employer]

## 2019-01-16 NOTE — Telephone Encounter (Signed)
Patient returned phone call. Best # [phone]

## 2019-01-16 NOTE — Assessment & Plan Note (Signed)
Patient has had longstanding anemia where the hemoglobin was between 10 to 12 g. Since April 2020 her hemoglobin has gotten worse and today it is at 6.6. During the same timeframe her creatinine got from 5.8-4.46 F20 folic acid and iron studies were normal. 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  Current treatment:Aranesp injections  started 01/09/2019 Lab review:   Return to clinic every 4 weeks for Aranesp injections. I discussed with her that if the Aranesp injection does not maintain her hemoglobin then we may have to consider doing a bone marrow biopsy. She tells me that long time ago she had a bone marrow biopsy with Dr. Arnoldo Morale.  Breast cancer: Doing well with exemestane.

## 2019-01-19 ENCOUNTER — Other Ambulatory Visit: Payer: Self-pay

## 2019-01-19 ENCOUNTER — Telehealth: Payer: Self-pay | Admitting: Hematology and Oncology

## 2019-01-19 ENCOUNTER — Other Ambulatory Visit: Payer: Medicare Other | Admitting: *Deleted

## 2019-01-19 ENCOUNTER — Ambulatory Visit
Admission: RE | Admit: 2019-01-19 | Discharge: 2019-01-19 | Disposition: A | Discharge status: Home / Self Care | Payer: Medicare Other | Source: Ambulatory Visit | Attending: Nurse Practitioner | Admitting: Nurse Practitioner

## 2019-01-19 ENCOUNTER — Other Ambulatory Visit: Payer: Self-pay | Admitting: *Deleted

## 2019-01-19 DIAGNOSIS — J90 Pleural effusion, not elsewhere classified: Secondary | ICD-10-CM

## 2019-01-19 DIAGNOSIS — Z79899 Other long term (current) drug therapy: Secondary | ICD-10-CM

## 2019-01-19 MED ORDER — POTASSIUM CHLORIDE CRYS ER 20 MEQ PO TBCR: 20.0000 meq | EXTENDED_RELEASE_TABLET | Freq: Every day | ORAL | 3 refills | Status: DC

## 2019-01-19 NOTE — Telephone Encounter (Signed)
I talk with patient regarding schedule  

## 2019-01-19 NOTE — Progress Notes (Signed)
[facility] Summary Report / Loop Recorder 

## 2019-01-20 ENCOUNTER — Telehealth: Payer: Self-pay | Admitting: Nurse Practitioner

## 2019-01-20 LAB — HEPATIC FUNCTION PANEL
ALT: 24 IU/L (ref 0–32)
AST: 38 IU/L (ref 0–40)
Albumin: 2.9 g/dL — ABNORMAL LOW (ref 3.7–4.7)
Alkaline Phosphatase: 55 IU/L (ref 39–117)
Bilirubin Total: 0.7 mg/dL (ref 0.0–1.2)
Bilirubin, Direct: 0.34 mg/dL (ref 0.00–0.40)
Total Protein: 5.5 g/dL — ABNORMAL LOW (ref 6.0–8.5)

## 2019-01-20 LAB — CBC WITH DIFFERENTIAL/PLATELET
Basophils Absolute: 0.1 10*3/uL (ref 0.0–0.2)
Basos: 1 %
EOS (ABSOLUTE): 0.2 10*3/uL (ref 0.0–0.4)
Eos: 4 %
Hematocrit: 29.3 % — ABNORMAL LOW (ref 34.0–46.6)
Hemoglobin: 8.9 g/dL — ABNORMAL LOW (ref 11.1–15.9)
Immature Grans (Abs): 0 10*3/uL (ref 0.0–0.1)
Immature Granulocytes: 0 %
Lymphocytes Absolute: 0.6 10*3/uL — ABNORMAL LOW (ref 0.7–3.1)
Lymphs: 10 %
MCH: 32 pg (ref 26.6–33.0)
MCHC: 30.4 g/dL — ABNORMAL LOW (ref 31.5–35.7)
MCV: 105 fL — ABNORMAL HIGH (ref 79–97)
Monocytes Absolute: 0.7 10*3/uL (ref 0.1–0.9)
Monocytes: 10 %
Neutrophils Absolute: 4.7 10*3/uL (ref 1.4–7.0)
Neutrophils: 75 %
Platelets: 123 10*3/uL — ABNORMAL LOW (ref 150–450)
RBC: 2.78 x10E6/uL — ABNORMAL LOW (ref 3.77–5.28)
RDW: 12.9 % (ref 11.7–15.4)
WBC: 6.3 10*3/uL (ref 3.4–10.8)

## 2019-01-20 LAB — TSH: TSH: 2.9 u[IU]/mL (ref 0.450–4.500)

## 2019-01-20 LAB — BASIC METABOLIC PANEL
BUN/Creatinine Ratio: 10 — ABNORMAL LOW (ref 12–28)
BUN: 22 mg/dL (ref 8–27)
CO2: 27 mmol/L (ref 20–29)
Calcium: 8.5 mg/dL — ABNORMAL LOW (ref 8.7–10.3)
Chloride: 103 mmol/L (ref 96–106)
Creatinine, Ser: 2.12 mg/dL — ABNORMAL HIGH (ref 0.57–1.00)
GFR calc Af Amer: 26 mL/min/{1.73_m2} — ABNORMAL LOW (ref >59)
GFR calc non Af Amer: 23 mL/min/{1.73_m2} — ABNORMAL LOW (ref >59)
Glucose: 98 mg/dL (ref 65–99)
Potassium: 2.8 mmol/L — CL (ref 3.5–5.2)
Sodium: 144 mmol/L (ref 134–144)

## 2019-01-20 NOTE — Telephone Encounter (Signed)
See result notes. This has been addressed by Truitt Merle, NP.

## 2019-01-20 NOTE — Telephone Encounter (Signed)
New Message   Crystal with [facility] is calling to report critical labs

## 2019-01-23 ENCOUNTER — Ambulatory Visit: Payer: Medicare Other

## 2019-01-23 ENCOUNTER — Other Ambulatory Visit: Payer: Medicare Other

## 2019-01-25 NOTE — Progress Notes (Signed)
CARDIOLOGY OFFICE NOTE  Date:  01/28/2019    Hannah [name] Date of Birth: 1945-09-08 Medical Record #[id number]  PCP:  Mackie Pai, PA-C  Cardiologist:  Atilano Median    Chief Complaint  Patient presents with   Follow-up    History of Present Illness: Hannah [name] is a 73 y.o. female who presents today for a work in visit/follow up.  She isa former patient of Dr. Susa Simmonds. She now follows with meand Dr. Lovena Le. Previously saw Dr. Aundra Dubin.   She has a history of CAD s/p CABG, bioprosthetic AVR, CKD,&paroxysmal atrial fibrillation. She had her initial CABG in 1998, then had redo CABG in 2009 with bioprosthetic AVR. She does not remember when she last had documented atrial fibrillation, but she thinks it was a number of years ago, soundedperi-operative around the time of her redo CABG. She was never put on anticoagulation other than Plavixbut had been on dronedaronein the past.   Over the years she has had intermittent issues. These have included tachypalpitations and chest pain. Last cath in 2014 - grafts patent. Myoview in 2015 without ischemia.Event monitor in 8/16 showed atrial tachycardia versus atypical atrial flutter. Toprol XL was increased.She saw Dr Lovena Le who thought that she had short runs of atrial tachycardia and not atrial flutter. Therefore, she hadnot been anticoagulated until here recently.   Wasdiagnosed with breast cancerin 2017/2018.She hadsurgery.Gotchemo/radiation - no Adriamycin due to her history of CHF.Her course was complicated by volume overload and anemia.  In January of 2018began to haveorthostasis. She was placed on midodrine and her lasix was changed to as needed only.ACE and beta blocker stopped.Ended up getting on amiodarone in October of 2018 for palpitations/atrial arrhythmias. I saw her in December - she was doing ok - BP started tracking way back up with the Midodrine and we adjusted her medicines. She  had a spell of syncopeearlier in theyear in 2019and fell - broke her shoulder and sustained a large hematoma. She now has a loop in place.   She was admitted twice in [facility] 2019- recurrent syncope - her loop has been ok. EEG without seizure noted.She wasplaced on Florinef. She was taken off her Ultram and Gabapentin. She was not using Lasix.Found to have some PAF this past summer in 2019 - placed on Eliquis and her aspirin/Plavix were stopped. Shedid have thereturn of palpitations - ended up seeing her with Dr. Lovena Le and we opted to increase her amiodarone to BID dosingshort term- this helped.  She was in the ER towards the end of the summer of 2019 - volume overload - treated with IV lasix. I then saw her and she was doing better. Had gotten her back to using just prn Lasix to avoid orthostasis/syncope. We got her echo updated -has grade 2 DD and EF is still ok.Plan was to cut amiodarone back to QD dosing as of October 1.When I saw her back in March - she had gotten some iron infusions and had her gallbladder out in the interim - did well.   Last saw Dr. Lovena Le in April - had had some angina - beta blocker was increased - BP was high so Florinef was cut back. Since that visit she has had more issues with DOE/found to have pleural effusion - had thoracentesis. Echo was updated - she was put back on diuretics - which included Aldactone - got orthostatic again - I have placed her back on Florinef.  I then saw her in June - she  looked bad. Was doing poorly. Profound weakness. Significant weight loss. I stopped the Aldactone - she had worsening kidney function. Repeat CXR was stable. She has been back to pulmonary - had a good visit. I ended up referring her back to hematology/oncology to get her anemia addressed - she has been on Aranesp and I think IV iron in the remote past. Our last visit was back in July - she was actually feeling ok but had not gotten back to hematology - she then  was seen and found to have profound anemia - HGB down to 6.6 - quite unusual for her. Her effusion has come back on the right - it is small - she is now back on diuretics by me.   The patient does not have symptoms concerning for COVID-19 infection (fever, chills, cough, or new shortness of breath).   Comes in today. Here alone. She is fair. She had a brisk diuresis with the 40 mg of Lasix - weight went to 162. Now back up. Seems to have more swelling on exam. She had a CXR yesterday with Dr. Lamonte Sakai - small effusion on the right unchanged. Her breathing is stable. She was transfused. On Aranesp weekly now. Also got referred to GI - will probably need colonoscopy. She notes that after getting her transfusions - her stools were totally black in color - they are now normal - she has had no active bleeding. No chest pain. Her family feels like "something else is wrong with her" - just have not discerned what it is. She has never had a HGB down to 6 in the past. She remains on Eliquis. She has had breast cancer. Last colonoscopy was in 2014. She will need lab here today - concern she was crushing up the potassium pills. No fever or chills noted. Denies night sweats.   Past Medical History:  Diagnosis Date   Abnormally small mouth    Acute on chronic combined systolic and diastolic congestive heart failure ([place]) 07/17/2013   Adrenal insufficiency ([place]) 07/03/2016   AKI (acute kidney injury) ([place]) 09/01/2017   Allergic rhinitis 10/14/2009   Qualifier: Diagnosis of  By: Lamonte Sakai MD, Rose Fillers   Overview:  Overview:  Qualifier: Diagnosis of  By: Lamonte Sakai MD, Rose Fillers  Last Assessment & Plan:  Please continue Xyzal and Nasacort as you have been using them   Anemia    Asthma 05/12/2009   10/12/2014 p extensive coaching HFA effectiveness =    75% s spacer    Overview:  Overview:  10/12/2014 p extensive coaching HFA effectiveness =    75% s spacer   Last Assessment & Plan:  Please continue Symbicort 2 puffs twice a day.  Remember to rinse and gargle after taking this medication. Take albuterol 2 puffs up to every 4 hours if needed for shortness of breath.  Follow with Dr Lamonte Sakai in 6 month   Atrial tachycardia ([place]) 04/03/2013   Overview:  Last Assessment & Plan:  I have discussed the likely benign nature of her problem. She has very minimal palpitations. I doubt atrial flutter. I would not anti-coagulate. Her monitor demonstrates that her episodes are short lived. Will follow.    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 2011   Chronic coronary artery disease 01/24/2011   Overview:  Last Assessment & Plan:  Stable with no ischemic symptoms.  Continue ASA, statin, ACEI, beta blocker.    Chronic diastolic CHF (congestive heart failure) ([place]) 09/24/2013   CKD (chronic kidney disease), stage IV (HCC) 09/24/2013   Closed fracture of head of left humerus 12/12/2593   Complication of anesthesia    Essential hypertension 05/12/2009   Qualifier: Diagnosis of  By: Lamonte Sakai MD, Rose Fillers    Gallstones    GERD (gastroesophageal reflux disease)    Glaucoma    H/O atrial tachycardia 05/12/2009   Qualifier: History of  By: Lamonte Sakai MD, Rose Fillers   Overview:  Overview:  Qualifier: History of  By: Lamonte Sakai MD, Rose Fillers  Last Assessment & Plan:  There was no evidence of this on her cardiac monitor. I would recommend watchful waiting.    Hearing loss of right ear    History of aortic valve replacement 10/27/2010   Overview:   Last Assessment & Plan:  Will get echo to reassess bioprosthetic aortic valve.    History of non-ST elevation myocardial infarction (NSTEMI)    Sept 2014--  thought to be type II HTN w/ LHC without infarct related artery and patent grafts   History of radiation therapy 05/24/16-07/26/16   right breast 50.4 Gy in 28 fractions, right breast boost 10 Gy in 5 fractions   Hyperlipidemia    Hypertension    Resolved.   Iron deficiency anemia    Mania ([place]) 03/05/2016   Moderate persistent asthma    pulmologist-  Dr. Malvin [facility]   Nonischemic cardiomyopathy [facility])    Osteomyelitis of toe of left foot ([place]) 04/06/2016   Osteopenia of multiple sites 10/19/2015   PAF (paroxysmal atrial fibrillation) ([place])    Personal history of chemotherapy 2017   Personal history of radiation therapy 2017   PONV (postoperative nausea and vomiting)    Port catheter in place 02/17/2016   Psoriasis    right leg   Renal calculus, right    Renal insufficiency, mild    S/P AVR    prosthesis valve placement 2009 at same time re-do CABG   Seizure ([place]) 06/29/2016   Seizure-like activity ([place]) 09/01/2017   Sensorineural hearing loss (SNHL) of both ears 01/06/2016   Stroke ([place])    residual rt hearing loss   Syncopal episodes 05/21/2018   Syncope 09/06/2017   Type 2 diabetes mellitus ([place])    monitored by dr Dwyane Dee    Past Surgical History:  Procedure Laterality Date   AORTIC VALVE REPLACEMENT  2009   #46mm [facility] Ease pericardial valve (done same time is CABG)   BREAST LUMPECTOMY Right 01/18/2016   BREAST LUMPECTOMY WITH RADIOACTIVE SEED AND SENTINEL LYMPH NODE BIOPSY Right 01/18/2016   Procedure: RIGHT BREAST LUMPECTOMY WITH RADIOACTIVE SEED AND SENTINEL LYMPH NODE BIOPSY;  Surgeon: Alphonsa Overall, MD;  Location: [facility];  Service: General;  Laterality: Right;   CARDIAC CATHETERIZATION  03/23/2008   Pre-redo CABG: L main OK, LAD (T), CFX (T), OM1 99%, RCA (T), LIMA-LAD OK, SVG-OM(?3) OK w/  little florw to OM2, SVG-RCA OK. EF NL   CARPAL TUNNEL RELEASE     CHOLECYSTECTOMY N/A 07/07/2018  Procedure: LAPAROSCOPIC CHOLECYSTECTOMY WITH INTRAOPERATIVE CHOLANGIOGRAM ERAS PATHWAY;  Surgeon: Alphonsa Overall, MD;  Location: [facility];  Service: General;  Laterality: N/A;   COLONOSCOPY     around 2015. Possibly with [place] &  re-do 2009   Had LIMA to DX/LAD, SVG to 2 marginal branches and SVG to [facility] originally; SVG to 3rd OM and PD at time of redo   CYSTOSCOPY W/ URETERAL STENT PLACEMENT Right 12/20/2014   Procedure: CYSTOSCOPY WITH RETROGRADE PYELOGRAM/URETERAL STENT PLACEMENT;  Surgeon: Cleon Gustin, MD;  Location[facility];  Service: Urology;  Laterality: Right;   ESOPHAGOGASTRODUODENOSCOPY     many years ago per patient    ESOPHAGOGASTRODUODENOSCOPY ENDOSCOPY  06/17/2018   EYE SURGERY Bilateral    cataracts   HOLMIUM LASER APPLICATION Right 7/67/3419   Procedure:  HOLMIUM LASER LITHOTRIPSY;  Surgeon: Cleon Gustin, MD;  Location: [facility];  Service: Urology;  Laterality: Right;   IR THORACENTESIS ASP PLEURAL SPACE W/IMG GUIDE  10/03/2018   LEFT HEART CATHETERIZATION WITH CORONARY/GRAFT ANGIOGRAM N/A 02/23/2013   Procedure: LEFT HEART CATHETERIZATION WITH Beatrix Fetters;  Surgeon: Blane Ohara, MD;  Location: [facility];  Service: Cardiovascular;  Laterality: N/A;   LOOP RECORDER INSERTION N/A 08/30/2017   Procedure: LOOP RECORDER INSERTION;  Surgeon: Evans Lance, MD;  Location: [facility];  Service: Cardiovascular;  Laterality: N/A;   PORTACATH PLACEMENT Left 01/18/2016   Procedure: INSERTION PORT-A-CATH;  Surgeon: Alphonsa Overall, MD;  Location: [facility];  Service: General;  Laterality: Left;   portacath removal     TONSILLECTOMY     TRANSTHORACIC ECHOCARDIOGRAM  02-24-2013      mild LVH,  ef 50-55%/  AV bioprosthesis was present with very mild stenosis and no regurg., mean  grandient 35mmHg, peak grandient 8mmHg /  mild MR/  mild LAE and RAE/  moderate TR   TUBAL LIGATION       Medications: Current Meds  Medication Sig   ACCU-CHEK GUIDE test strip Use to test blood sugar 3 times daily   acetaminophen (TYLENOL) 325 MG tablet Take 2 tablets (650 mg total) by mouth every 6 (six) hours as needed for mild pain (or Fever >/= 101).   albuterol (PROVENTIL HFA;VENTOLIN HFA) 108 (90 Base) MCG/ACT inhaler Inhale 2 puffs into the lungs every 6 (six) hours as needed for wheezing or shortness of breath.   amiodarone (PACERONE) 200 MG tablet Take 200 mg by mouth daily.   budesonide-formoterol (SYMBICORT) 160-4.5 MCG/ACT inhaler INHALE TWO PUFFS INTO THE LUNGS TWICE DAILY   ELIQUIS 5 MG TABS tablet TAKE ONE TABLET BY MOUTH TWICE DAILY   exemestane (AROMASIN) 25 MG tablet TAKE ONE TABLET BY MOUTH DAILY AFTER BREAKFAST   ezetimibe (ZETIA) 10 MG tablet TAKE ONE TABLET BY MOUTH EVERY DAY   famotidine (PEPCID) 20 MG tablet One after supper   fludrocortisone (FLORINEF) 0.1 MG tablet Take 1 tablet (0.1 mg total) by mouth daily.   furosemide (LASIX) 20 MG tablet 40 mg once a week - 20 mg the other 6 days   insulin glargine (LANTUS) 100 UNIT/ML injection Inject 16 units under the skin once daily in the morning.   insulin lispro (HUMALOG) 100 UNIT/ML injection Inject 4-6 units under the skin three times daily before meals.   Insulin Pen Needle 32G X 4 MM MISC Use as instructed to inject insulin 4 times daily.   loratadine (CLARITIN) 10 MG tablet Take  10 mg by mouth daily.   metoprolol tartrate (LOPRESSOR) 25 MG tablet Take 1 tablet (25 mg total) by mouth 2 (two) times daily.   pantoprazole (PROTONIX) 40 MG tablet Take 1 tablet (40 mg total) by mouth daily.   potassium chloride SA (K-DUR) 20 MEQ tablet Take 1 tablet (20 mEq total) by mouth daily.   rosuvastatin (CRESTOR) 40 MG tablet Take 1 tablet (40 mg total) by mouth at bedtime.   zolpidem (AMBIEN) 5 MG tablet  TAKE ONE TABLET BY MOUTH AT BEDTIME AS NEEDED FOR SLEEP   [DISCONTINUED] furosemide (LASIX) 20 MG tablet Two tablets (40 mg ) X 3 days, than one tablet (20 mg ) daily.     Allergies: Allergies  Allergen Reactions   Amoxicillin Rash and Other (See Comments)    Tolerates Cephalosporins Has patient had a PCN reaction causing immediate rash, facial/tongue/throat swelling, SOB or lightheadedness with hypotension: No Has patient had a PCN reaction causing severe rash involving mucus membranes or skin necrosis: Yes Has patient had a PCN reaction that required hospitalization No Has patient had a PCN reaction occurring within the last 10 years: No If all of the above answers are "NO", then may proceed with Cephalosporin use.    Aldactone [Spironolactone] Other (See Comments)    CKD/hypokalemia   Imdur [Isosorbide Dinitrate] Other (See Comments)    Headache/severe hypotension/Syncope   Tape Other (See Comments)    Must use paper tape   Arimidex [Anastrozole] Nausea Only   Latex Itching and Other (See Comments)    Dentist office only   Tetracycline Rash    Social History: The patient  reports that she has never smoked. She has never used smokeless tobacco. She reports that she does not drink alcohol or use drugs.   Family History: The patient's family history includes Diabetes in her maternal grandmother and son; Healthy in her brother; Heart attack in her brother; Heart disease in her brother, father, and maternal grandmother; Heart failure in her father.   Review of Systems: Please see the history of present illness.   All other systems are reviewed and negative.   Physical Exam: VS:  BP 126/66    Pulse 60    Ht 5\' 5"  (1.651 m)    Wt 168 lb 6.4 oz (76.4 kg)    SpO2 98%    BMI 28.02 kg/m  .  BMI Body mass index is 28.02 kg/m.  Wt Readings from Last 3 Encounters:  01/28/19 168 lb 6.4 oz (76.4 kg)  01/27/19 168 lb 6.4 oz (76.4 kg)  01/16/19 166 lb 4 oz (75.4 kg)     General: Pleasant. She looks chronically ill. Color is sallow. She is alert and in no acute distress.   HEENT: Normal.  Neck: Supple, no JVD, carotid bruits, or masses noted.  Cardiac: Regular rate and rhythm. Outflow murmur. 1+ edema today - has support stockings on.   Respiratory:  Lungs are clear to auscultation but decreased on the right.  GI: Soft and nontender.  MS: No deformity or atrophy. Gait and ROM intact.  Skin: Warm and dry. Color is sallow. Lots of bruising.  Neuro:  Strength and sensation are intact and no gross focal deficits noted.  Psych: Alert, appropriate and with normal affect.   LABORATORY DATA:  EKG:  EKG is not ordered today.   Lab Results  Component Value Date   WBC 6.3 01/19/2019   HGB 8.9 (L) 01/19/2019   HCT 29.3 (L) 01/19/2019   PLT 123 (  L) 01/19/2019   GLUCOSE 98 01/19/2019   CHOL 106 12/02/2018   TRIG 97 12/02/2018   HDL 34 (L) 12/02/2018   LDLDIRECT 53.0 10/08/2016   LDLCALC 53 12/02/2018   ALT 24 01/19/2019   AST 38 01/19/2019   NA 144 01/19/2019   K 2.8 (LL) 01/19/2019   CL 103 01/19/2019   CREATININE 2.12 (H) 01/19/2019   BUN 22 01/19/2019   CO2 27 01/19/2019   TSH 2.900 01/19/2019   INR 1.91 05/21/2018   HGBA1C 6.8 (H) 11/18/2018   MICROALBUR 53.4 (H) 05/12/2018     BNP (last 3 results) Recent Labs    02/04/18 0933  BNP 222.2*    ProBNP (last 3 results) Recent Labs    09/25/18 1113 10/28/18 1041  PROBNP 406.0* 364.0*     Other Studies Reviewed Today:  ECHOIMPRESSIONS5/2020  1. The left ventricle has normal systolic function with an ejection fraction of 60-65%. The cavity size was normal. There is mildly increased left ventricular wall thickness. Left ventricular diastolic Doppler parameters are consistent with  pseudonormalization. 2. The right ventricle has normal systolic function. The cavity was mildly enlarged. 3. Left atrial size was moderately dilated. 4. Right atrial size was mildly  dilated. 5. The mitral valve is grossly normal. Mild thickening of the mitral valve leaflet. There is mild mitral annular calcification present. 6. The tricuspid valve is grossly normal. 7. There is mild dilatation of the ascending aorta measuring 40 mm. 8. Normal LV systolic function; moderate diastolic dysfunction; mild LVH; mildly dilated ascending aorta; s/p AVR with mean gradient of 18 mmHg and no AI; biatrial enlargement; mild MR; mild RVE.   EchoStudy Conclusions8/2019  - Left ventricle: The cavity size was normal. Systolic function was normal. The estimated ejection fraction was in the range of 60% to 65%.Wall motion was normal; there were no regional wall motion abnormalities. Features are consistent with a pseudonormal left ventricular filling pattern, with concomitant abnormal relaxation and increased filling pressure (grade 2 diastolic dysfunction). Doppler parameters are consistent with high ventricular filling pressure. - Aortic valve: A bioprosthesis was present and functioning normally. Mean gradient (S): 16 mm Hg. - Mitral valve: Calcified annulus. There was mild regurgitation. - Left atrium: The atrium was moderately dilated. - Right ventricle: The cavity size was mildly dilated. Wall thickness was normal. - Tricuspid valve: There was mild regurgitation. - Pulmonary arteries: Systolic pressure could not be accurately estimated.  EchoStudy ConclusionsDecember 2018  - Left ventricle: The cavity size was normal. Wall thickness was increased in a pattern of severe LVH. Systolic function was normal. The estimated ejection fraction was in the range of 60% to 65%. - Aortic valve: A bioprosthesis was present.   MYOVIEW FINDINGS FROM 06/2013: Normal resting EKG. Slight ST depressions noted in aVL after administration of lexiscan. Mild shortness of breath and nausea resolved after the test. EKG is nondiagnostic for ischemia.  TID ratio 1.05. Lung-heart ratio 0.43. Normal ventricular chamber size.  IMPRESSION: No evidence for ischemia. Normal wall motion. LVEF 72%.   Electronically Signed By: Guy Sandifer On: 06/15/2013 14:27   Assessment/Plan:  1.Profound anemia - she has always had a low HGB - never in the 6 range. Will most likely need colonoscopy. She has been referred to GI. Seeing heme/onc - on Aranesp. Based on these findings - we may have to address the Eliquis.   2. Recurrent pleural effusion - on low dose Lasix - CXR from yesterday unchanged - breathing stable for now - still unclear to  me as to why she has this. We will plan to recheck in a month.   3. Failure to thrive - she is holding her own - but still worrisome - unclear as to exactly what is driving all of this - will also check blood cultures today to rule out SBE.   4. Chronic diastolic HF -back on Lasix - weight is up - more swelling - she will use 40 mg once a week. We have to be careful because she is so prone to having orthostasis.   5. Prior recurrent spells of palpitations -back to regular dosing of metoprolol - remains on low dose amiodarone - not noted today.  6.Unexplained syncope-she has ILR in place. She has had AF noted. She continues to follow with EP  7. PAF - on Eliquis - in sinus by exam today. CHADSVASC is at least 6   8.History oforthostasis/syncope- sheis back onlow dose Florinef -we will continue this for now. She is back on Lasix - will have to monitor her volume status   9. CAD- prior CABG and prior AVR - she has no active symptoms. Continue with the current regimen. LastLHC showed stable anatomy with patent grafts except for SVG-RCA from older CABG. No active chest pain.   10. CKD- followed by nephrology - Dr. Posey Pronto- no longer on Aldactone. Repeat lab today.   11. Carotid disease-noted 1 to 39% bilateral stenosis from study 10/2017 -will need repeat study 10/2019- not  discussed today  12. Recent lap chole - she did have EGD prior to this noted.   13. HTN-BP is ok.   30. COVID-19 Education: The signs and symptoms of COVID-19 were discussed with the patient and how to seek care for testing (follow up with PCP or arrange E-visit).  The importance of social distancing, staying at home, hand hygiene and wearing a mask when out in public were discussed today.  Current medicines are reviewed with the patient today.  The patient does not have concerns regarding medicines other than what has been noted above.  The following changes have been made:  See above.  Labs/ tests ordered today include:    Orders Placed This Encounter  Procedures   Blood culture (routine single)   Blood culture (routine single)   DG Chest 2 View   Basic metabolic panel   CBC     Disposition:   FU with me in one month with repeat CXR.  She will need close follow up.   Patient is agreeable to this plan and will call if any problems develop in the interim.   SignedTruitt Merle, NP  01/28/2019 12:52 PM  [facility] [address] [place], [place]  [address] Phone: [phone] Fax: [phone]

## 2019-01-27 ENCOUNTER — Encounter: Payer: Self-pay | Admitting: Adult Health

## 2019-01-27 ENCOUNTER — Ambulatory Visit (INDEPENDENT_AMBULATORY_CARE_PROVIDER_SITE_OTHER): Payer: Medicare Other

## 2019-01-27 ENCOUNTER — Ambulatory Visit (INDEPENDENT_AMBULATORY_CARE_PROVIDER_SITE_OTHER): Payer: Medicare Other | Admitting: Adult Health

## 2019-01-27 ENCOUNTER — Other Ambulatory Visit: Payer: Self-pay

## 2019-01-27 VITALS — BP 116/74 | HR 63 | Temp 98.1°F | Ht 65.0 in | Wt 168.4 lb

## 2019-01-27 DIAGNOSIS — J90 Pleural effusion, not elsewhere classified: Secondary | ICD-10-CM

## 2019-01-27 DIAGNOSIS — D638 Anemia in other chronic diseases classified elsewhere: Secondary | ICD-10-CM

## 2019-01-27 DIAGNOSIS — N184 Chronic kidney disease, stage 4 (severe): Secondary | ICD-10-CM

## 2019-01-27 DIAGNOSIS — J4530 Mild persistent asthma, uncomplicated: Secondary | ICD-10-CM

## 2019-01-27 NOTE — Patient Instructions (Signed)
Chest xray today .  Continue on Lasix 20mg  daily  Follow up with Cardiology Hannah [name] tomorrow as planned .  Continue on Symbicort 2 puffs Twice daily Follow up with Hannah [name]  In 3 months and As needed   Please contact office for sooner follow up if symptoms do not improve or worsen or seek emergency care

## 2019-01-27 NOTE — Assessment & Plan Note (Signed)
Appears stable on recent lab work.  Continue follow-up with hematology

## 2019-01-27 NOTE — Assessment & Plan Note (Signed)
Currently well controlled on Symbicort  Plan  Patient Instructions  Chest xray today .  Continue on Lasix 20mg  daily  Follow up with Cardiology Reva Bores tomorrow as planned .  Continue on Symbicort 2 puffs Twice daily Follow up with Dr. Lamonte Sakai  In 3 months and As needed   Please contact office for sooner follow up if symptoms do not improve or worsen or seek emergency care

## 2019-01-27 NOTE — Assessment & Plan Note (Signed)
Recurrent right pleural effusion.  Previous thoracentesis revealed a transudate of process in April 2020.  She only has a small right pleural effusion.  Did seem to respond with clinical improvement to diuresis.  She does have underlying chronic kidney disease.  Will need to proceed with Lasix cautiously.  She has a follow-up with cardiology tomorrow with labs.  Plan   Patient Instructions  Chest xray today .  Continue on Lasix 20mg  daily  Follow up with Cardiology Reva Bores tomorrow as planned .  Continue on Symbicort 2 puffs Twice daily Follow up with Dr. Lamonte Sakai  In 3 months and As needed   Please contact office for sooner follow up if symptoms do not improve or worsen or seek emergency care

## 2019-01-27 NOTE — Assessment & Plan Note (Signed)
Serum creatinine was stable most recent last.  Use Lasix cautiously.  Avoid nephrotoxins

## 2019-01-27 NOTE — Progress Notes (Signed)
@Patient  ID: Hannah [name], female    DOB: 21-Jun-1945, 73 y.o.   MRN: [id number]  Chief Complaint  Patient presents with   Follow-up    Pleural effusion     Referring provider: Elise Benne  HPI: 73 year old female never smoker followed for asthma, right pleural effusion  Medical history significant for A. fib on amiodarone, CHF , breast cancer status post lobectomy, adjunctive chemotherapy and radiation.  Chronic anemia on Aranesp with previous transfusions required (followed by hematology)-undergoing work-up for chronic anemia versus mild dysplastic syndrome  chronic kidney disease stage III  TEST/EVENTS :  2D echo Oct 20, 2018 showed EF of 60-65%,   01/27/2019 Follow up : Pleural Effusion  Patient presents for a 2-week follow-up.  Patient has underlying asthma.  She is on Symbicort.  She was seen last visit with increased shortness of breath, orthopnea exertional dyspnea and weight gain of 9 pounds with lower extremity edema.  Chest x-ray showed a small right-sided pleural effusion.  Patient had a transudate of right pleural effusion April 2020 that required thoracentesis.  Patient was started on diuresis by cardiology with 40 mg of Lasix for 3 days and then 20 mg daily.  Repeat chest x-ray on August 10 showed small bilateral effusions.  Patient says she is feeling better.  Her shortness of breath has decreased.  She still gets winded and has low energy.  As above she has multiple medical problems.  Today chest x-ray shows a small right pleural effusion with no significant change.  Lab work was reviewed from cardiology on August 10 that showed chronic renal disease with creatinine at 2.1 which was stable.  Potassium was decreased at 2.8.  She was provided with potassium supplements.  Anemia was stable with hemoglobin 8.9.     Better  ;lasxi 20  Allergies  Allergen Reactions   Amoxicillin Rash and Other (See Comments)    Tolerates Cephalosporins Has patient had a PCN  reaction causing immediate rash, facial/tongue/throat swelling, SOB or lightheadedness with hypotension: No Has patient had a PCN reaction causing severe rash involving mucus membranes or skin necrosis: Yes Has patient had a PCN reaction that required hospitalization No Has patient had a PCN reaction occurring within the last 10 years: No If all of the above answers are "NO", then may proceed with Cephalosporin use.    Aldactone [Spironolactone] Other (See Comments)    CKD/hypokalemia   Imdur [Isosorbide Dinitrate] Other (See Comments)    Headache/severe hypotension/Syncope   Tape Other (See Comments)    Must use paper tape   Arimidex [Anastrozole] Nausea Only   Latex Itching and Other (See Comments)    Dentist office only   Tetracycline Rash    Immunization History  Administered Date(s) Administered   Influenza Split 05/11/2012, 02/28/2013, 03/11/2014   Influenza Whole 03/11/2009, 03/12/2011   Influenza, High Dose Seasonal PF 05/10/2017, 04/29/2018   Influenza,inj,Quad PF,6+ Mos 03/09/2015   Pneumococcal Conjugate-13 03/11/2013   Pneumococcal Polysaccharide-23 06/12/2007, 05/11/2012    Past Medical History:  Diagnosis Date   Abnormally small mouth    Acute on chronic combined systolic and diastolic congestive heart failure ([place]) 07/17/2013   Adrenal insufficiency ([place]) 07/03/2016   AKI (acute kidney injury) ([place]) 09/01/2017   Allergic rhinitis 10/14/2009   Qualifier: Diagnosis of  By: Lamonte Sakai MD, Rose Fillers   Overview:  Overview:  Qualifier: Diagnosis of  By: Lamonte Sakai MD, Rose Fillers  Last Assessment & Plan:  Please continue Xyzal and Nasacort as you have been  using them   Anemia    Asthma 05/12/2009   10/12/2014 p extensive coaching HFA effectiveness =    75% s spacer    Overview:  Overview:  10/12/2014 p extensive coaching HFA effectiveness =    75% s spacer   Last Assessment & Plan:  Please continue Symbicort 2 puffs twice a day. Remember to rinse and gargle after taking this  medication. Take albuterol 2 puffs up to every 4 hours if needed for shortness of breath.  Follow with Dr Lamonte Sakai in 6 month   Atrial tachycardia ([place]) 04/03/2013   Overview:  Last Assessment & Plan:  I have discussed the likely benign nature of her problem. She has very minimal palpitations. I doubt atrial flutter. I would not anti-coagulate. Her monitor demonstrates that her episodes are short lived. Will follow.    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 2011   Chronic coronary artery disease 01/24/2011   Overview:  Last Assessment & Plan:  Stable with no ischemic symptoms.  Continue ASA, statin, ACEI, beta blocker.    Chronic diastolic CHF (congestive heart failure) ([place]) 09/24/2013   CKD (chronic kidney disease), stage IV (H[facility]) 09/24/2013   Closed fracture of head of left humerus 06/16/1094   Complication of anesthesia    Essential hypertension 05/12/2009   Qualifier: Diagnosis of  By: Lamonte Sakai MD, Rose Fillers    Gallstones    GERD (gastroesophageal reflux disease)    Glaucoma    H/O atrial tachycardia 05/12/2009   Qualifier: History of  By: Lamonte Sakai MD, Rose Fillers   Overview:  Overview:  Qualifier: History of  By: Lamonte Sakai MD, Rose Fillers  Last Assessment & Plan:  There was no evidence of this on her cardiac monitor. I would recommend watchful waiting.    Hearing loss of right ear    History of aortic valve replacement 10/27/2010   Overview:  Last Assessment & Plan:  Will get echo to  reassess bioprosthetic aortic valve.    History of non-ST elevation myocardial infarction (NSTEMI)    Sept 2014--  thought to be type II HTN w/ LHC without infarct related artery and patent grafts   History of radiation therapy 05/24/16-07/26/16   right breast 50.4 Gy in 28 fractions, right breast boost 10 Gy in 5 fractions   Hyperlipidemia    Hypertension    Resolved.   Iron deficiency anemia    Mania ([place]) 03/05/2016   Moderate persistent asthma    pulmologist-  Dr. Malvin Johns   Nonischemic cardiomyopathy [facility])    Osteomyelitis of toe of left foot ([place]) 04/06/2016   Osteopenia of multiple sites 10/19/2015   PAF (paroxysmal atrial fibrillation) ([place])    Personal history of chemotherapy 2017   Personal history of radiation therapy 2017   PONV (postoperative nausea and vomiting)    Port catheter in place 02/17/2016   Psoriasis    right leg   Renal calculus, right    Renal insufficiency, mild  S/P AVR    prosthesis valve placement 2009 at same time re-do CABG   Seizure ([place]) 06/29/2016   Seizure-like activity ([place]) 09/01/2017   Sensorineural hearing loss (SNHL) of both ears 01/06/2016   Stroke [facility])    residual rt hearing loss   Syncopal episodes 05/21/2018   Syncope 09/06/2017   Type 2 diabetes mellitus ([place])    monitored by dr Dwyane Dee    Tobacco History: Social History   Tobacco Use  Smoking Status Never Smoker  Smokeless Tobacco Never Used   Counseling given: Not Answered   Outpatient Medications Prior to Visit  Medication Sig Dispense Refill   ACCU-CHEK GUIDE test strip Use to test blood sugar 3 times daily 100 each 3   acetaminophen (TYLENOL) 325 MG tablet Take 2 tablets (650 mg total) by mouth every 6 (six) hours as needed for mild pain (or Fever >/= 101).     albuterol (PROVENTIL HFA;VENTOLIN HFA) 108 (90 Base) MCG/ACT inhaler Inhale 2 puffs into the lungs every 6 (six) hours as needed for wheezing or shortness of breath.     amiodarone  (PACERONE) 200 MG tablet Take 200 mg by mouth daily.     budesonide-formoterol (SYMBICORT) 160-4.5 MCG/ACT inhaler INHALE TWO PUFFS INTO THE LUNGS TWICE DAILY 3 Inhaler 1   ELIQUIS 5 MG TABS tablet TAKE ONE TABLET BY MOUTH TWICE DAILY 60 tablet 6   exemestane (AROMASIN) 25 MG tablet TAKE ONE TABLET BY MOUTH DAILY AFTER BREAKFAST 90 tablet 3   ezetimibe (ZETIA) 10 MG tablet TAKE ONE TABLET BY MOUTH EVERY DAY 90 tablet 3   famotidine (PEPCID) 20 MG tablet One after supper 30 tablet 11   fludrocortisone (FLORINEF) 0.1 MG tablet Take 1 tablet (0.1 mg total) by mouth daily.     furosemide (LASIX) 20 MG tablet Two tablets (40 mg ) X 3 days, than one tablet (20 mg ) daily. 90 tablet 3   insulin glargine (LANTUS) 100 UNIT/ML injection Inject 16 units under the skin once daily in the morning. 20 mL 3   insulin lispro (HUMALOG) 100 UNIT/ML injection Inject 4-6 units under the skin three times daily before meals. 20 mL 3   Insulin Pen Needle 32G X 4 MM MISC Use as instructed to inject insulin 4 times daily. 400 each 3   loratadine (CLARITIN) 10 MG tablet Take 10 mg by mouth daily.     metoprolol tartrate (LOPRESSOR) 25 MG tablet Take 1 tablet (25 mg total) by mouth 2 (two) times daily. 180 tablet 3   pantoprazole (PROTONIX) 40 MG tablet Take 1 tablet (40 mg total) by mouth daily. 30 tablet 11   potassium chloride SA (K-DUR) 20 MEQ tablet Take 1 tablet (20 mEq total) by mouth daily. 90 tablet 3   rosuvastatin (CRESTOR) 40 MG tablet Take 1 tablet (40 mg total) by mouth at bedtime. 90 tablet 3   zolpidem (AMBIEN) 5 MG tablet TAKE ONE TABLET BY MOUTH AT BEDTIME AS NEEDED FOR SLEEP 30 tablet 2   nitroGLYCERIN (NITROSTAT) 0.4 MG SL tablet Place 1 tablet (0.4 mg total) under the tongue every 5 (five) minutes as needed for chest pain. 30 tablet 3   No facility-administered medications prior to visit.      Review of Systems:   Constitutional:   No  weight loss, night sweats,  Fevers, chills,    +fatigue, or  lassitude.  HEENT:   No headaches,  Difficulty swallowing,  Tooth/dental problems, or  Sore throat,  No sneezing, itching, ear ache, nasal congestion, post nasal drip,   CV:  No chest pain,  Orthopnea, PND,+ swelling in lower extremities, No anasarca, dizziness, palpitations, syncope.   GI  No heartburn, indigestion, abdominal pain, nausea, vomiting, diarrhea, change in bowel habits, loss of appetite, bloody stools.   Resp:   No chest wall deformity  Skin: no rash or lesions.  GU: no dysuria, change in color of urine, no urgency or frequency.  No flank pain, no hematuria   MS:  No joint pain or swelling.  No decreased range of motion.  No back pain.    Physical Exam  BP 116/74 (BP Location: Left Arm, Patient Position: Sitting, Cuff Size: Normal)    Pulse 63    Temp 98.1 F (36.7 C) (Oral)    Ht 5\' 5"  (1.651 m)    Wt 168 lb 6.4 oz (76.4 kg)    SpO2 96%    BMI 28.02 kg/m   GEN: A/Ox3; pleasant , NAD, chronically ill-appearing   HEENT:  [place]/AT,   NOSE-clear, THROAT-clear, no lesions, no postnasal drip or exudate noted.   NECK:  Supple w/ fair ROM; no JVD; normal carotid impulses w/o bruits; no thyromegaly or nodules palpated; no lymphadenopathy.    RESP  Clear  P & A; w/o, wheezes/ rales/ or rhonchi. no accessory muscle use, no dullness to percussion  CARD:  RRR, no m/r/g, 1+  peripheral edema, pulses intact, no cyanosis or clubbing.  GI:   Soft & nt; nml bowel sounds; no organomegaly or masses detected.   Musco: Warm bil, no deformities or joint swelling noted.   Neuro: alert, no focal deficits noted.    Skin: Warm, no lesions or rashes    Lab Results:  CBC    Component Value Date/Time   WBC 6.3 01/19/2019 1057   WBC 5.3 01/16/2019 0904   WBC 5.9 10/28/2018 1041   RBC 2.78 (L) 01/19/2019 1057   RBC 2.54 (L) 01/16/2019 0904   HGB 8.9 (L) 01/19/2019 1057   HGB 8.5 (L) 04/27/2016 1352   HCT 29.3 (L) 01/19/2019 1057   HCT 27.3 (L)  04/27/2016 1352   PLT 123 (L) 01/19/2019 1057   MCV 105 (H) 01/19/2019 1057   MCV 97.5 04/27/2016 1352   MCH 32.0 01/19/2019 1057   MCH 32.3 01/16/2019 0904   MCHC 30.4 (L) 01/19/2019 1057   MCHC 29.9 (L) 01/16/2019 0904   RDW 12.9 01/19/2019 1057   RDW 17.4 (H) 04/27/2016 1352   LYMPHSABS 0.6 (L) 01/19/2019 1057   LYMPHSABS 1.0 04/27/2016 1352   MONOABS 0.7 01/16/2019 0904   MONOABS 0.9 04/27/2016 1352   EOSABS 0.2 01/19/2019 1057   BASOSABS 0.1 01/19/2019 1057   BASOSABS 0.1 04/27/2016 1352    BMET    Component Value Date/Time   NA 144 01/19/2019 1057   NA 143 04/27/2016 1352   K 2.8 (LL) 01/19/2019 1057   K 4.1 04/27/2016 1352   CL 103 01/19/2019 1057   CO2 27 01/19/2019 1057   CO2 29 04/27/2016 1352   GLUCOSE 98 01/19/2019 1057   GLUCOSE 214 (H) 01/02/2019 1113   GLUCOSE 109 04/27/2016 1352   BUN 22 01/19/2019 1057   BUN 29.5 (H) 04/27/2016 1352   CREATININE 2.12 (H) 01/19/2019 1057   CREATININE 2.10 (H) 01/02/2019 1113   CREATININE 1.66 (H) 05/23/2016 1143   CREATININE 1.3 (H) 04/27/2016 1352   CALCIUM 8.5 (L) 01/19/2019 1057   CALCIUM 8.9 04/27/2016 1352   GFRNONAA 23 (  L) 01/19/2019 1057   GFRNONAA 23 (L) 01/02/2019 1113   GFRAA 26 (L) 01/19/2019 1057   GFRAA 26 (L) 01/02/2019 1113    BNP    Component Value Date/Time   BNP 222.2 (H) 02/04/2018 0933   BNP 243.3 (H) 05/23/2016 1143    ProBNP    Component Value Date/Time   PROBNP 364.0 (H) 10/28/2018 1041    Imaging: Dg Chest 2 View  Result Date: 01/27/2019 CLINICAL DATA:  Follow-up right pleural effusion EXAM: CHEST - 2 VIEW COMPARISON:  01/19/2019 FINDINGS: prior CABG. Heart is normal size. Small right pleural effusion again noted, unchanged. No confluent opacities. No acute bony abnormality. IMPRESSION: Stable small right pleural effusion. Electronically Signed   By: Rolm Baptise M.D.   On: 01/27/2019 10:41   Dg Chest 2 View  Result Date: 01/19/2019 CLINICAL DATA:  Follow-up right-sided pleural  effusion. EXAM: CHEST - 2 VIEW COMPARISON:  01/13/2019 FINDINGS: Small right pleural effusion is again identified and stable. Small left-sided pleural effusion is noted posteriorly. The overall appearance is stable. Postsurgical changes are seen. No acute bony abnormality is noted. IMPRESSION: Small pleural effusions without acute abnormality. Electronically Signed   By: Inez Catalina M.D.   On: 01/19/2019 16:21   Dg Chest 2 View  Result Date: 01/13/2019 CLINICAL DATA:  Follow-up pleural effusion, history of prior thoracentesis EXAM: CHEST - 2 VIEW COMPARISON:  12/02/2018 FINDINGS: Cardiac shadow is within normal limits. Postsurgical changes are seen. Loop recorder is again noted. Aortic calcifications are noted. The lungs are well aerated with small right-sided pleural effusion slightly increased when compare with the prior exam. No focal infiltrate is seen. No bony abnormality is noted. IMPRESSION: Small right pleural effusion slightly increased from the prior study. Electronically Signed   By: Inez Catalina M.D.   On: 01/13/2019 22:20    acetaminophen (TYLENOL) tablet 650 mg    Date Action Dose Route User   01/02/2019 1250 Given 650 mg Oral Veverly Fells, RN    diphenhydrAMINE (BENADRYL) capsule 25 mg    Date Action Dose Route User   01/02/2019 1250 Given 25 mg Oral Veverly Fells, RN    epoetin alfa-epbx (RETACRIT) injection 40,000 Units    Date Action Dose Route User   01/09/2019 1119 Given 40000 Units Subcutaneous (Left Arm) Kasandra Knudsen A, LPN    epoetin alfa-epbx (RETACRIT) injection 40,000 Units    Date Action Dose Route User   01/16/2019 1000 Given 40000 Units Subcutaneous (Right Arm) Kasandra Knudsen A, LPN    0.9 %  sodium chloride infusion (Manually program via Guardrails IV Fluids)    Date Action Dose Route User   01/02/2019 1244 New Bag/Given 250 mL Intravenous Veverly Fells, RN      PFT Results Latest Ref Rng & Units 12/08/2018  FVC-Pre L 1.76  FVC-Predicted Pre % 60  FVC-Post  L 1.91  FVC-Predicted Post % 66  Pre FEV1/FVC % % 77  Post FEV1/FCV % % 80  FEV1-Pre L 1.36  FEV1-Predicted Pre % 62  FEV1-Post L 1.53  DLCO UNC% % 83  DLCO COR %Predicted % 122  TLC L 4.52  TLC % Predicted % 89  RV % Predicted % 122    No results found for: NITRICOXIDE      Assessment & Plan:   Pleural effusion on right Recurrent right pleural effusion.  Previous thoracentesis revealed a transudate of process in April 2020.  She only has a small right pleural effusion.  Did seem  to respond with clinical improvement to diuresis.  She does have underlying chronic kidney disease.  Will need to proceed with Lasix cautiously.  She has a follow-up with cardiology tomorrow with labs.  Plan   Patient Instructions  Chest xray today .  Continue on Lasix 20mg  daily  Follow up with Cardiology Reva Bores tomorrow as planned .  Continue on Symbicort 2 puffs Twice daily Follow up with Dr. Lamonte Sakai  In 3 months and As needed   Please contact office for sooner follow up if symptoms do not improve or worsen or seek emergency care       Asthma Currently well controlled on Symbicort  Plan  Patient Instructions  Chest xray today .  Continue on Lasix 20mg  daily  Follow up with Cardiology Reva Bores tomorrow as planned .  Continue on Symbicort 2 puffs Twice daily Follow up with Dr. Lamonte Sakai  In 3 months and As needed   Please contact office for sooner follow up if symptoms do not improve or worsen or seek emergency care       Anemia of chronic disease Appears stable on recent lab work.  Continue follow-up with hematology  CKD (chronic kidney disease), stage IV (HCC) Serum creatinine was stable most recent last.  Use Lasix cautiously.  Avoid nephrotoxins     Rexene Edison, NP 01/27/2019

## 2019-01-28 ENCOUNTER — Ambulatory Visit (INDEPENDENT_AMBULATORY_CARE_PROVIDER_SITE_OTHER): Payer: Medicare Other | Admitting: Nurse Practitioner

## 2019-01-28 ENCOUNTER — Encounter: Payer: Self-pay | Admitting: Nurse Practitioner

## 2019-01-28 ENCOUNTER — Other Ambulatory Visit: Payer: Self-pay

## 2019-01-28 VITALS — BP 126/66 | HR 60 | Ht 65.0 in | Wt 168.4 lb

## 2019-01-28 DIAGNOSIS — I428 Other cardiomyopathies: Secondary | ICD-10-CM

## 2019-01-28 DIAGNOSIS — Z79899 Other long term (current) drug therapy: Secondary | ICD-10-CM | POA: Diagnosis not present

## 2019-01-28 DIAGNOSIS — R0600 Dyspnea, unspecified: Secondary | ICD-10-CM

## 2019-01-28 DIAGNOSIS — N184 Chronic kidney disease, stage 4 (severe): Secondary | ICD-10-CM

## 2019-01-28 DIAGNOSIS — Z7189 Other specified counseling: Secondary | ICD-10-CM

## 2019-01-28 DIAGNOSIS — J90 Pleural effusion, not elsewhere classified: Secondary | ICD-10-CM

## 2019-01-28 DIAGNOSIS — I5032 Chronic diastolic (congestive) heart failure: Secondary | ICD-10-CM

## 2019-01-28 DIAGNOSIS — D509 Iron deficiency anemia, unspecified: Secondary | ICD-10-CM

## 2019-01-28 DIAGNOSIS — I2510 Atherosclerotic heart disease of native coronary artery without angina pectoris: Secondary | ICD-10-CM | POA: Diagnosis not present

## 2019-01-28 DIAGNOSIS — Z951 Presence of aortocoronary bypass graft: Secondary | ICD-10-CM

## 2019-01-28 DIAGNOSIS — Z952 Presence of prosthetic heart valve: Secondary | ICD-10-CM

## 2019-01-28 MED ORDER — FUROSEMIDE 20 MG PO TABS: ORAL_TABLET | ORAL | 3 refills | Status: DC

## 2019-01-28 NOTE — Patient Instructions (Addendum)
After Visit Summary:  We will be checking the following labs today - BMET & CBC  We will get 2 sets of blood cultures today.  CXR in one month   Medication Instructions:    Continue with your current medicines. BUT  Take 40 mg of Lasix once a week - tomorrow   If you need a refill on your cardiac medications before your next appointment, please call your pharmacy.     Testing/Procedures To Be Arranged:  N/A  Follow-Up:   See me after your CXR in a month     At [facility], you and your health needs are our priority.  As part of our continuing mission to provide you with exceptional heart care, we have created designated Provider Care Teams.  These Care Teams include your primary Cardiologist (physician) and Advanced Practice Providers (APPs -  Physician Assistants and Nurse Practitioners) who all work together to provide you with the care you need, when you need it.  Special Instructions:  . Stay safe, stay home, wash your hands for at least 20 seconds and wear a mask when out in public.  . It was good to talk with you today.    Call the [place] office at [phone] if you have any questions, problems or concerns.

## 2019-01-29 ENCOUNTER — Other Ambulatory Visit: Payer: Self-pay | Admitting: *Deleted

## 2019-01-29 DIAGNOSIS — E876 Hypokalemia: Secondary | ICD-10-CM

## 2019-01-29 LAB — CBC
Hematocrit: 28.6 % — ABNORMAL LOW (ref 34.0–46.6)
Hemoglobin: 9 g/dL — ABNORMAL LOW (ref 11.1–15.9)
MCH: 31.4 pg (ref 26.6–33.0)
MCHC: 31.5 g/dL (ref 31.5–35.7)
MCV: 100 fL — ABNORMAL HIGH (ref 79–97)
Platelets: 116 10*3/uL — ABNORMAL LOW (ref 150–450)
RBC: 2.87 x10E6/uL — ABNORMAL LOW (ref 3.77–5.28)
RDW: 12.7 % (ref 11.7–15.4)
WBC: 5.8 10*3/uL (ref 3.4–10.8)

## 2019-01-29 LAB — BASIC METABOLIC PANEL
BUN/Creatinine Ratio: 15 (ref 12–28)
BUN: 27 mg/dL (ref 8–27)
CO2: 27 mmol/L (ref 20–29)
Calcium: 8.5 mg/dL — ABNORMAL LOW (ref 8.7–10.3)
Chloride: 104 mmol/L (ref 96–106)
Creatinine, Ser: 1.78 mg/dL — ABNORMAL HIGH (ref 0.57–1.00)
GFR calc Af Amer: 32 mL/min/{1.73_m2} — ABNORMAL LOW (ref >59)
GFR calc non Af Amer: 28 mL/min/{1.73_m2} — ABNORMAL LOW (ref >59)
Glucose: 119 mg/dL — ABNORMAL HIGH (ref 65–99)
Potassium: 3 mmol/L — ABNORMAL LOW (ref 3.5–5.2)
Sodium: 144 mmol/L (ref 134–144)

## 2019-01-29 NOTE — Progress Notes (Signed)
error 

## 2019-01-30 ENCOUNTER — Inpatient Hospital Stay: Payer: Medicare Other

## 2019-01-30 ENCOUNTER — Other Ambulatory Visit: Payer: Self-pay

## 2019-01-30 VITALS — BP 134/62 | HR 62 | Temp 99.0°F | Resp 18

## 2019-01-30 DIAGNOSIS — D649 Anemia, unspecified: Secondary | ICD-10-CM | POA: Diagnosis not present

## 2019-01-30 DIAGNOSIS — D638 Anemia in other chronic diseases classified elsewhere: Secondary | ICD-10-CM

## 2019-01-30 LAB — CBC WITH DIFFERENTIAL (CANCER CENTER ONLY)
Abs Immature Granulocytes: 0.02 10*3/uL (ref 0.00–0.07)
Basophils Absolute: 0 10*3/uL (ref 0.0–0.1)
Basophils Relative: 1 %
Eosinophils Absolute: 0.2 10*3/uL (ref 0.0–0.5)
Eosinophils Relative: 3 %
HCT: 29.1 % — ABNORMAL LOW (ref 36.0–46.0)
Hemoglobin: 8.6 g/dL — ABNORMAL LOW (ref 12.0–15.0)
Immature Granulocytes: 0 %
Lymphocytes Relative: 9 %
Lymphs Abs: 0.5 10*3/uL — ABNORMAL LOW (ref 0.7–4.0)
MCH: 31 pg (ref 26.0–34.0)
MCHC: 29.6 g/dL — ABNORMAL LOW (ref 30.0–36.0)
MCV: 105.1 fL — ABNORMAL HIGH (ref 80.0–100.0)
Monocytes Absolute: 0.6 10*3/uL (ref 0.1–1.0)
Monocytes Relative: 11 %
Neutro Abs: 4.2 10*3/uL (ref 1.7–7.7)
Neutrophils Relative %: 76 %
Platelet Count: 92 10*3/uL — ABNORMAL LOW (ref 150–400)
RBC: 2.77 MIL/uL — ABNORMAL LOW (ref 3.87–5.11)
RDW: 13.8 % (ref 11.5–15.5)
WBC Count: 5.5 10*3/uL (ref 4.0–10.5)
nRBC: 0 % (ref 0.0–0.2)

## 2019-01-30 LAB — SAMPLE TO BLOOD BANK

## 2019-01-30 MED ORDER — EPOETIN ALFA-EPBX 40000 UNIT/ML IJ SOLN
40000.0000 [IU] | Freq: Once | INTRAMUSCULAR | Status: AC
  Administered 2019-01-30: 40000 [IU] via SUBCUTANEOUS
  Filled 2019-01-30: qty 1

## 2019-01-30 NOTE — Assessment & Plan Note (Signed)
Patient has had longstanding anemia where the hemoglobin was between 10 to 12 g. Since April 2020 her hemoglobin has gotten worse and today it is at 6.6. During the same timeframe her creatinine got from 4.8-8.45 B33 folic acid and iron studies were normal. 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  Current treatment:Retacrit injectionsweekly started 01/09/2019 Lab review: Hemoglobin is 8.2, MCV 107.9  Return to clinic weekly for retacrit injections. I discussed with her that if the retacrit injection does not maintain her hemoglobin then we may have to consider doing a bone marrow biopsy. She tells me that long time ago she had a bone marrow biopsy with Dr. Arnoldo Morale.   Breast cancer: Doing well with exemestane.  Continue to closely monitor her blood counts.  Next week she is going to [place] for a cabin vacation.  She will not receive an injection next week.   Today is her third injection

## 2019-01-30 NOTE — Patient Instructions (Signed)

## 2019-02-02 ENCOUNTER — Other Ambulatory Visit: Payer: Self-pay | Admitting: Internal Medicine

## 2019-02-02 ENCOUNTER — Telehealth: Payer: Self-pay

## 2019-02-02 ENCOUNTER — Other Ambulatory Visit: Payer: Self-pay

## 2019-02-02 ENCOUNTER — Ambulatory Visit (INDEPENDENT_AMBULATORY_CARE_PROVIDER_SITE_OTHER): Payer: Medicare Other | Admitting: Gastroenterology

## 2019-02-02 VITALS — BP 130/48 | HR 64 | Temp 98.4°F | Ht 65.0 in | Wt 164.5 lb

## 2019-02-02 DIAGNOSIS — D649 Anemia, unspecified: Secondary | ICD-10-CM | POA: Diagnosis not present

## 2019-02-02 NOTE — Telephone Encounter (Signed)
Please comment on eliquis. 

## 2019-02-02 NOTE — Telephone Encounter (Signed)
Patient with diagnosis of atrial fibrillation on Eliquis for anticoagulation.    Procedure: colonoscopy Date of procedure: 03/06/2019  CHADS2-VASc score of  6 (CHF, HTN, AGE, DM2, CAD, female)  CrCl 33.2 Platelet count 92  Per office protocol, MD to review any patient with platelet count under 100  If cleared by cardiologist, would recommend to hold Eliquis 1 day prior to procedure and restart evening of or day after at discretion of gastroenterologist.

## 2019-02-02 NOTE — Patient Instructions (Signed)
If you are age 73 or older, your body mass index should be between 23-30. Your Body mass index is 27.37 kg/m. If this is out of the aforementioned range listed, please consider follow up with your Primary Care Provider.  If you are age 62 or younger, your body mass index should be between 19-25. Your Body mass index is 27.37 kg/m. If this is out of the aformentioned range listed, please consider follow up with your Primary Care Provider.   You have been scheduled for an abdominal ultrasound at [facility]  (1st floorl) on 02/17/19 at 9am. Please arrive 15 minutes prior to your appointment for registration. Make certain not to have anything to eat or drink 6 hours prior to your appointment. Should you need to reschedule your appointment, please contact radiology at [phone]. This test typically takes about 30 minutes to perform.  You have been scheduled for a colonoscopy. Please follow written instructions given to you at your visit today.  Please pick up your prep supplies at the pharmacy within the next 1-3 days. If you use inhalers (even only as needed), please bring them with you on the day of your procedure. Your physician has requested that you go to www.[email or web address].com and enter the access code given to you at your visit today. This web site gives a general overview about your procedure. However, you should still follow specific instructions given to you by our office regarding your preparation for the procedure.  Thank you,  Dr. Jackquline Denmark

## 2019-02-02 NOTE — Progress Notes (Signed)
Chief Complaint:   Referring Provider:  Mackie Pai, PA-C      ASSESSMENT AND PLAN;   #1. Anemia of chronic disease d/t CRI on Aranesp. R/O other causes. No active GI bleeding despite Eliquis.  Heme-negative today.  May need BM Bx.  Neg EGD Jan 2020. Last colon ?06/2012 (per pt)-neg (no report anywhere)  #2.  Abn LFTs s/o hepatocellular pattern. Likely d/t amiodarone. Resolved after decreasing dose. Low platelet count is concerning.  No liver cirrhosis at this time on [place] 05/2018 or CT 05/2018 but patient at risk. Also, review of op note (lap chole) July 07, 2018 reveals normal liver.  #3.  Epigastric pain (resolved) d/t biliary colic S/P lap chole with IOC 07/07/2018.  #4.  Multiple comorbid conditions - dCHF (EF- 60-65%), CAD s/p CABG with bioprosthetic AVR, A Fib on Eliquis, insulin-dependent DM2, CKD4, carotid artery stenosis, breast Ca and H/O recurrent orthostasis/syncope.  Plan: - Proceed with colonoscopy with miralax. Discussed risks & benefits. (Risks including rare perforation req laparotomy, bleeding after biopsies/polypectomy req blood transfusion, rare chance of missing neoplasms, risks of anesthesia/sedation). Benefits outweigh the risks. Patient agrees to proceed. All the questions were answered. Consent forms given for review. - Cardiology clearence to hold eliquis 48hrs before and for colonoscopy. - Repeat US abdomen with Doppler (R/O liver cirrhosis) - If still with anemia and she requires more blood transfusions, will consider capsule endoscopy.  At this time, since she has heme-negative stools, it may not be covered by Medicare. - Please run it by Osvaldo Angst to make sure she satisfies [facility] citerion - FU in 12 weeks. - Avoid NSAIDs. - Trend CBC (as per heme-onc).   HPI:    Hannah [name] is a 73 y.o. female  With pancytopenia (labs as below) Sent to the GI clinic by hematology/oncology for further evaluation due to history of profound anemia.  Hemoglobin  went down to 6.6 01/02/2019 requiring 2 units of PRBC to Hb 7.6.  The platelets also remained low.  Eliquis was continued.  She is being considered for bone marrow biopsy by hematology/oncology to r/o myelofibrosis.  No nausea, vomiting, heartburn, regurgitation, odynophagia or dysphagia.  No significant diarrhea or constipation.  There is no melena or hematochezia. No unintentional weight loss.  No further abdominal pain since she had lap chole.  She did tell me that she had dark stools (? Melena)x 4-5 days, several weeks ago.  These have now "cleared".  No nonsteroidals.  She does take Eliquis for A. fib.   Past Medical History:  Diagnosis Date  . Abnormally small mouth   . Acute on chronic combined systolic and diastolic congestive heart failure ([place]) 07/17/2013  . Adrenal insufficiency ([place]) 07/03/2016  . AKI (acute kidney injury) ([place]) 09/01/2017  . Allergic rhinitis 10/14/2009   Qualifier: Diagnosis of  By: Lamonte Sakai MD, Rose Fillers   Overview:  Overview:  Qualifier: Diagnosis of  By: Lamonte Sakai MD, Rose Fillers  Last Assessment & Plan:  Please continue Xyzal and Nasacort as you have been using them  . Anemia   . Asthma 05/12/2009   10/12/2014 p extensive coaching HFA effectiveness =    75% s spacer    Overview:  Overview:  10/12/2014 p extensive coaching HFA effectiveness =    75% s spacer   Last Assessment & Plan:  Please continue Symbicort 2 puffs twice a day. Remember to rinse and gargle after taking this medication. Take albuterol 2 puffs up to every 4 hours if needed  for shortness of breath.  Follow with Dr Lamonte Sakai in 6 month  . Atrial tachycardia ([place]) 04/03/2013   Overview:  Last Assessment & Plan:  I have discussed the likely benign nature of her problem. She has very minimal palpitations. I doubt atrial flutter. I would not anti-coagulate. Her monitor demonstrates that her episodes are short lived. Will follow.   . Bilateral carotid artery stenosis    Bilateral ICA 40-59%/  >50% LECA  . Breast cancer  ([place]) 01/18/2016   right breast  . Breast cancer of upper-inner quadrant of right female breast ([place]) 12/16/2015  . CAD (coronary artery disease) cardiologist-  dr Aundra Dubin   Remote CABG in 1998 with redo in 2009  . Chemotherapy induced nausea and vomiting 02/24/2016  . Chemotherapy-induced peripheral neuropathy ([place]) 04/27/2016  . 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 CHF (congestive heart failure) (H[facility])    EF is low normal at 50 to 55% per echo in Jan. 2011  . Chronic coronary artery disease 01/24/2011   Overview:  Last Assessment & Plan:  Stable with no ischemic symptoms.  Continue ASA, statin, ACEI, beta blocker.   . Chronic diastolic CHF (congestive heart failure) ([place]) 09/24/2013  . CKD (chronic kidney disease), stage IV ([place]) 09/24/2013  . Closed fracture of head of left humerus 09/09/2017  . Complication of anesthesia   . Essential hypertension 05/12/2009   Qualifier: Diagnosis of  By: Lamonte Sakai MD, Rose Fillers   . Gallstones   . GERD (gastroesophageal reflux disease)   . Glaucoma   . H/O atrial tachycardia 05/12/2009   Qualifier: History of  By: Lamonte Sakai MD, Rose Fillers   Overview:  Overview:  Qualifier: History of  By: Lamonte Sakai MD, Rose Fillers  Last Assessment & Plan:  There was no evidence of this on her cardiac monitor. I would recommend watchful waiting.   Marland Kitchen Hearing loss of right ear   . History of aortic valve replacement 10/27/2010   Overview:  Last Assessment & Plan:  Will get echo to reassess bioprosthetic aortic valve.   Marland Kitchen History of non-ST elevation myocardial infarction (NSTEMI)    Sept 2014--  thought to be type II HTN w/ LHC without infarct related artery and patent grafts  . History of radiation therapy 05/24/16-07/26/16   right breast 50.4 Gy in 28 fractions, right breast boost 10 Gy in 5 fractions  . Hyperlipidemia   . Hypertension    Resolved.  . Iron deficiency anemia   . Mania ([place]) 03/05/2016  . Moderate persistent asthma    pulmologist-  Dr. Malvin Johns  . Nonischemic  cardiomyopathy ([place])   . Osteomyelitis of toe of left foot ([place]) 04/06/2016  . Osteopenia of multiple sites 10/19/2015  . PAF (paroxysmal atrial fibrillation) ([place])   . Personal history of chemotherapy 2017  . Personal history of radiation therapy 2017  . PONV (postoperative nausea and vomiting)   . Port catheter in place 02/17/2016  . Psoriasis    right leg  . Renal calculus, right   . Renal insufficiency, mild   . S/P AVR    prosthesis valve placement 2009 at same time re-do CABG  . Seizure ([place]) 06/29/2016  . Seizure-like activity ([place]) 09/01/2017  . Sensorineural hearing loss (SNHL) of both ears 01/06/2016  . Stroke [facility])    residual rt hearing loss  . Syncopal episodes 05/21/2018  . Syncope 09/06/2017  . Type 2 diabetes mellitus ([place])    monitored by dr Dwyane Dee    Past Surgical History:  Procedure Laterality Date  .  AORTIC VALVE REPLACEMENT  2009   [facility]done same time is [facility]  . BREAST LUMPECTOMY Right 01/18/2016  . BREAST LUMPECTOMY WITH RADIOACTIVE SEED AND SENTINEL LYMPH NODE BIOPSY Right 01/18/2016   Procedure: RIGHT BREAST LUMPECTOMY WITH RADIOACTIVE SEED AND SENTINEL LYMPH NODE BIOPSY;  Surgeon: DAlphonsa Overall MD;  [facility]  Service: General;  Laterality: Right;  . CARDIAC CATHETERIZATION  03/23/2008   Pre-redo CABG: L main OK, LAD (T), CFX (T), OM1 99%, RCA (T), LIMA-LAD OK, SVG-OM(?3) OK w/ little florw to OM2, SVG-RCA OK. EF NL  . CARPAL TUNNEL RELEASE    . CHOLECYSTECTOMY N/A 07/07/2018   Procedure: LAPAROSCOPIC CHOLECYSTECTOMY WITH INTRAOPERATIVE CHOLANGIOGRAM ERAS PATHWAY;  Surgeon: NAlphonsa Overall MD;  Location: [facility];  Service: General;  Laterality: N/A;  . COLONOSCOPY     around 2015. Possibly with [facility]   . CORONARY ARTERY BYPASS GRAFT  1998 &  re-do 2009   Had LIMA to DX/LAD, SVG to 2 marginal branches and [facility]; SVG to 3rd OM and PD at time of redo  . CYSTOSCOPY W/ URETERAL STENT PLACEMENT Right  12/20/2014   Procedure: CYSTOSCOPY WITH RETROGRADE PYELOGRAM/URETERAL STENT PLACEMENT;  Surgeon: PCleon Gustin MD;  [facility]  Service: Urology;  Laterality: Right;  . ESOPHAGOGASTRODUODENOSCOPY     many years ago per patient   . ESOPHAGOGASTRODUODENOSCOPY ENDOSCOPY  06/17/2018  . EYE SURGERY Bilateral    cataracts  . HOLMIUM LASER APPLICATION Right 70/34/7425  Procedure:  HOLMIUM LASER LITHOTRIPSY;  Surgeon: PCleon Gustin MD;  [facility]  Service: Urology;  Laterality: Right;  . IR THORACENTESIS ASP PLEURAL SPACE W/IMG GUIDE  10/03/2018  . LEFT HEART CATHETERIZATION WITH CORONARY/GRAFT ANGIOGRAM N/A 02/23/2013   Procedure: LEFT HEART CATHETERIZATION WITH CBeatrix Fetters  Surgeon: MBlane Ohara MD;  [facility];  Service: Cardiovascular;  Laterality: N/A;  . LOOP RECORDER INSERTION N/A 08/30/2017   Procedure: LOOP RECORDER INSERTION;  Surgeon: TEvans Lance MD;  [facility];  Service: Cardiovascular;  Laterality: N/A;  . PORTACATH PLACEMENT Left 01/18/2016   Procedure: INSERTION PORT-A-CATH;  Surgeon: DAlphonsa Overall MD;  [place] [facility]  Service: General;  Laterality: Left;  . portacath removal    . TONSILLECTOMY    . TRANSTHORACIC ECHOCARDIOGRAM  02-24-2013      mild LVH,  ef 50-55%/  AV bioprosthesis was present with very mild stenosis and no regurg., mean grandient 149mg, peak grandient 204m /  mild MR/  mild LAE and RAE/  moderate TR  . TUBAL LIGATION      Family History  Problem Relation Age of Onset  . Heart disease Father   . Heart failure Father   . Diabetes Maternal Grandmother   . Heart disease Maternal Grandmother   . Diabetes Son   . Healthy Brother        #1  . Heart attack Brother        #2  . Heart disease Brother        #2  . Colon cancer Neg Hx   . Esophageal cancer Neg Hx     Social History   Tobacco Use  . Smoking status: Never Smoker  . Smokeless  tobacco: Never Used  Substance Use Topics  . Alcohol use: No  . Drug use: No    Current Outpatient Medications  Medication Sig Dispense Refill  . ACCU-CHEK GUIDE test strip Use  to test blood sugar 3 times daily 100 each 3  . acetaminophen (TYLENOL) 325 MG tablet Take 2 tablets (650 mg total) by mouth every 6 (six) hours as needed for mild pain (or Fever >/= 101).    Marland Kitchen albuterol (PROVENTIL HFA;VENTOLIN HFA) 108 (90 Base) MCG/ACT inhaler Inhale 2 puffs into the lungs every 6 (six) hours as needed for wheezing or shortness of breath.    Marland Kitchen amiodarone (PACERONE) 200 MG tablet Take 200 mg by mouth daily.    . budesonide-formoterol (SYMBICORT) 160-4.5 MCG/ACT inhaler INHALE TWO PUFFS INTO THE LUNGS TWICE DAILY 3 Inhaler 1  . ELIQUIS 5 MG TABS tablet TAKE ONE TABLET BY MOUTH TWICE DAILY 60 tablet 6  . exemestane (AROMASIN) 25 MG tablet TAKE ONE TABLET BY MOUTH DAILY AFTER BREAKFAST 90 tablet 3  . ezetimibe (ZETIA) 10 MG tablet TAKE ONE TABLET BY MOUTH EVERY DAY 90 tablet 3  . famotidine (PEPCID) 20 MG tablet One after supper 30 tablet 11  . fludrocortisone (FLORINEF) 0.1 MG tablet Take 1 tablet (0.1 mg total) by mouth daily.    . furosemide (LASIX) 20 MG tablet 40 mg once a week - 20 mg the other 6 days 90 tablet 3  . insulin glargine (LANTUS) 100 UNIT/ML injection Inject 16 units under the skin once daily in the morning. 20 mL 3  . insulin lispro (HUMALOG) 100 UNIT/ML injection Inject 4-6 units under the skin three times daily before meals. 20 mL 3  . Insulin Pen Needle 32G X 4 MM MISC Use as instructed to inject insulin 4 times daily. 400 each 3  . loratadine (CLARITIN) 10 MG tablet Take 10 mg by mouth daily.    . metoprolol tartrate (LOPRESSOR) 25 MG tablet Take 1 tablet (25 mg total) by mouth 2 (two) times daily. 180 tablet 3  . pantoprazole (PROTONIX) 40 MG tablet Take 1 tablet (40 mg total) by mouth daily. 30 tablet 11  . potassium chloride SA (K-DUR) 20 MEQ tablet Take 1 tablet (20 mEq total)  by mouth daily. 90 tablet 3  . rosuvastatin (CRESTOR) 40 MG tablet Take 1 tablet (40 mg total) by mouth at bedtime. 90 tablet 3  . zolpidem (AMBIEN) 5 MG tablet TAKE ONE TABLET BY MOUTH AT BEDTIME AS NEEDED FOR SLEEP 30 tablet 2  . nitroGLYCERIN (NITROSTAT) 0.4 MG SL tablet Place 1 tablet (0.4 mg total) under the tongue every 5 (five) minutes as needed for chest pain. 30 tablet 3   No current facility-administered medications for this visit.     Allergies  Allergen Reactions  . Amoxicillin Rash and Other (See Comments)    Tolerates Cephalosporins Has patient had a PCN reaction causing immediate rash, facial/tongue/throat swelling, SOB or lightheadedness with hypotension: No Has patient had a PCN reaction causing severe rash involving mucus membranes or skin necrosis: Yes Has patient had a PCN reaction that required hospitalization No Has patient had a PCN reaction occurring within the last 10 years: No If all of the above answers are "NO", then may proceed with Cephalosporin use.   . Aldactone [Spironolactone] Other (See Comments)    CKD/hypokalemia  . Imdur [Isosorbide Dinitrate] Other (See Comments)    Headache/severe hypotension/Syncope  . Tape Other (See Comments)    Must use paper tape  . Arimidex [Anastrozole] Nausea Only  . Latex Itching and Other (See Comments)    Dentist office only  . Tetracycline Rash    Review of Systems:  Constitutional: Denies fever, chills, diaphoresis, appetite change  and fatigue.  HEENT: Denies photophobia, eye pain, redness, hearing loss, ear pain, congestion, sore throat, rhinorrhea, sneezing, mouth sores, neck pain, neck stiffness and tinnitus.   Respiratory: Denies SOB, DOE, cough, chest tightness,  and wheezing.   Cardiovascular: Denies chest pain, palpitations and leg swelling.  Genitourinary: Denies dysuria, urgency, frequency, hematuria, flank pain and difficulty urinating.  Musculoskeletal: Denies myalgias, back pain, joint swelling,  arthralgias and gait problem.  Skin: No rash.  Neurological: Denies dizziness, seizures, syncope, weakness, light-headedness, numbness and headaches.  Hematological: Denies adenopathy. Easy bruising, personal or family bleeding history  Psychiatric/Behavioral: No anxiety or depression     Physical Exam:    BP (!) 130/48   Pulse 64   Temp 98.4 F (36.9 C)   Ht 5' 5"  (1.651 m)   Wt 164 lb 8 oz (74.6 kg)   BMI 27.37 kg/m  Filed Weights   02/02/19 1406  Weight: 164 lb 8 oz (74.6 kg)   Constitutional:  Well-developed, in no acute distress. Psychiatric: Normal mood and affect. Behavior is normal. HEENT: Pupils normal.  Conjunctivae are normal. No scleral icterus. Neck supple.  Cardiovascular: Normal rate, regular rhythm. No edema Pulmonary/chest: Bilateral decreased breath sounds. Abdominal: Soft, nondistended. Nontender. Bowel sounds active throughout. There are no masses palpable. No hepatomegaly. Rectal: Examined in presence of Brooke CMA.  No external lesions.  Small internal hemorrhoids.  Brown heme-negative stools. Neurological: Alert and oriented to person place and time. Skin: Skin is warm and dry. No rashes noted.  Data Reviewed: I have personally reviewed following labs and imaging studies  CBC: CBC Latest Ref Rng & Units 01/30/2019 01/28/2019 01/19/2019  WBC 4.0 - 10.5 K/uL 5.5 5.8 6.3  Hemoglobin 12.0 - 15.0 g/dL 8.6(L) 9.0(L) 8.9(L)  Hematocrit 36.0 - 46.0 % 29.1(L) 28.6(L) 29.3(L)  Platelets 150 - 400 K/uL 92(L) 116(L) 123(L)    CMP: CMP Latest Ref Rng & Units 01/28/2019 01/19/2019 01/02/2019  Glucose 65 - 99 mg/dL 119(H) 98 214(H)  BUN 8 - 27 mg/dL 27 22 44(H)  Creatinine 0.57 - 1.00 mg/dL 1.78(H) 2.12(H) 2.10(H)  Sodium 134 - 144 mmol/L 144 144 139  Potassium 3.5 - 5.2 mmol/L 3.0(L) 2.8(LL) 3.9  Chloride 96 - 106 mmol/L 104 103 108  CO2 20 - 29 mmol/L 27 27 25   Calcium 8.7 - 10.3 mg/dL 8.5(L) 8.5(L) 8.4(L)  Total Protein 6.0 - 8.5 g/dL - 5.5(L) 5.6(L)  Total  Bilirubin 0.0 - 1.2 mg/dL - 0.7 0.5  Alkaline Phos 39 - 117 IU/L - 55 57  AST 0 - 40 IU/L - 38 37  ALT 0 - 32 IU/L - 24 25    GFR: Estimated Creatinine Clearance: 28.4 mL/min (A) (by C-G formula based on SCr of 1.78 mg/dL (H)).  Recent Results (from the past 240 hour(s))  Blood culture (routine single)     Status: None (Preliminary result)   Collection Time: 01/28/19  1:24 PM   Specimen: Blood   RA  Result Value Ref Range Status   BLOOD CULTURE, ROUTINE Preliminary report  Preliminary   Organism ID, Bacteria Comment  Preliminary    Comment: No growth in 36 - 48 hours.  Blood culture (routine single)     Status: None (Preliminary result)   Collection Time: 01/28/19  1:27 PM   Specimen: Blood   LM  Result Value Ref Range Status   BLOOD CULTURE, ROUTINE Preliminary report  Preliminary   Organism ID, Bacteria Comment  Preliminary    Comment: No growth in 36 -  48 hours.      Radiology Studies: Dg Chest 2 View  Result Date: 01/27/2019 CLINICAL DATA:  Follow-up right pleural effusion EXAM: CHEST - 2 VIEW COMPARISON:  01/19/2019 FINDINGS: prior CABG. Heart is normal size. Small right pleural effusion again noted, unchanged. No confluent opacities. No acute bony abnormality. IMPRESSION: Stable small right pleural effusion. Electronically Signed   By: Rolm Baptise M.D.   On: 01/27/2019 10:41   Dg Chest 2 View  Result Date: 01/19/2019 CLINICAL DATA:  Follow-up right-sided pleural effusion. EXAM: CHEST - 2 VIEW COMPARISON:  01/13/2019 FINDINGS: Small right pleural effusion is again identified and stable. Small left-sided pleural effusion is noted posteriorly. The overall appearance is stable. Postsurgical changes are seen. No acute bony abnormality is noted. IMPRESSION: Small pleural effusions without acute abnormality. Electronically Signed   By: Inez Catalina M.D.   On: 01/19/2019 16:21   Dg Chest 2 View  Result Date: 01/13/2019 CLINICAL DATA:  Follow-up pleural effusion, history of prior  thoracentesis EXAM: CHEST - 2 VIEW COMPARISON:  12/02/2018 FINDINGS: Cardiac shadow is within normal limits. Postsurgical changes are seen. Loop recorder is again noted. Aortic calcifications are noted. The lungs are well aerated with small right-sided pleural effusion slightly increased when compare with the prior exam. No focal infiltrate is seen. No bony abnormality is noted. IMPRESSION: Small right pleural effusion slightly increased from the prior study. Electronically Signed   By: Inez Catalina M.D.   On: 01/13/2019 22:20  25 minutes spent with the patient today and review of extensive records. Greater than 50% was spent in counseling and coordination of care with the patient     Carmell Austria, MD 02/02/2019, 2:39 PM  Cc: Mackie Pai, PA-C

## 2019-02-02 NOTE — Telephone Encounter (Signed)
[facility] Pre-operative Risk Assessment     Request for surgical clearance:     Endoscopy Procedure  What type of surgery is being performed?     Colonoscopy  When is this surgery scheduled?     03/06/19  What type of clearance is required ?   Pharmacy  Are there any medications that need to be held prior to surgery and how long? Eliquis  Practice name and name of physician performing surgery?      [facility]  What is your office phone and fax number?      Phone- [phone]  [phone]  Anesthesia type (None, local, MAC, general) ?       MAC

## 2019-02-03 LAB — CULTURE, BLOOD (SINGLE)

## 2019-02-03 NOTE — Telephone Encounter (Signed)
Eliquis 5mg  refill request received, pt is 73yrs old, weight-74.6kg, Crea-1.78, on 01/28/2019, Diagnosis-Afib, and last seen by Truitt Merle on 01/28/2019. Will send in refill to requested pharmacy.

## 2019-02-04 NOTE — Telephone Encounter (Signed)
Ok to proceed as noted by holding the eliquis for a day. GT

## 2019-02-05 ENCOUNTER — Other Ambulatory Visit: Payer: Medicare Other | Admitting: *Deleted

## 2019-02-05 ENCOUNTER — Other Ambulatory Visit: Payer: Self-pay

## 2019-02-05 DIAGNOSIS — E876 Hypokalemia: Secondary | ICD-10-CM

## 2019-02-05 LAB — BASIC METABOLIC PANEL
BUN/Creatinine Ratio: 11 — ABNORMAL LOW (ref 12–28)
BUN: 22 mg/dL (ref 8–27)
CO2: 26 mmol/L (ref 20–29)
Calcium: 8.5 mg/dL — ABNORMAL LOW (ref 8.7–10.3)
Chloride: 102 mmol/L (ref 96–106)
Creatinine, Ser: 1.98 mg/dL — ABNORMAL HIGH (ref 0.57–1.00)
GFR calc Af Amer: 28 mL/min/{1.73_m2} — ABNORMAL LOW (ref >59)
GFR calc non Af Amer: 25 mL/min/{1.73_m2} — ABNORMAL LOW (ref >59)
Glucose: 203 mg/dL — ABNORMAL HIGH (ref 65–99)
Potassium: 4.1 mmol/L (ref 3.5–5.2)
Sodium: 140 mmol/L (ref 134–144)

## 2019-02-05 NOTE — Telephone Encounter (Signed)
   Primary Cardiologist: Dr. Lovena Le  Chart reviewed as part of pre-operative protocol coverage. Given past medical history and time since last visit, based on ACC/AHA guidelines, IA LEEB would be at acceptable risk for the planned procedure without further cardiovascular testing.   I will route this recommendation to the requesting party via Epic fax function and remove from pre-op pool.  Please call with questions. Per Dr. Lovena Le, given history of thrombocytopenia, ok to hold eliquis for 1 day prior to the procedure and restart as soon as possible afterward.  [place], [place] 02/05/2019, 8:32 AM

## 2019-02-05 NOTE — Progress Notes (Signed)
Patient Care Team: Saguier, Iris Pert as PCP - General (Internal Medicine) Alphonsa Overall, MD as Consulting Physician (General Surgery) Nicholas Lose, MD as Consulting Physician (Hematology and Oncology) Gery Pray, MD as Consulting Physician (Radiation Oncology) Evans Lance, MD as Consulting Physician (Cardiology) Collene Gobble, MD as Consulting Physician (Pulmonary Disease) Elayne Snare, MD as Consulting Physician (Endocrinology) Delice Bison, Charlestine Massed, NP as Nurse Practitioner (Hematology and Oncology) Ardelle Balls., MD (Neurology) Izora Gala, MD as Consulting Physician (Otolaryngology) Trula Slade, DPM as Consulting Physician (Podiatry)  DIAGNOSIS:    ICD-10-CM   1. Anemia of chronic disease  D63.8   2. Malignant neoplasm of upper-inner quadrant of right breast in female, estrogen receptor positive ([place])  C50.211    Z17.0     SUMMARY OF ONCOLOGIC HISTORY: Oncology History  Breast cancer of upper-inner quadrant of right female breast ([place])  12/14/2015 Initial Diagnosis   Right breast biopsy 12:30 position: 2 masses, 1.6 cm mass: Invasive ductal carcinoma, grade 2, ER 0%, PR 0%, HER-2 negative, Ki-67 70%; satellite mass 8 mm: IDC grade 2 ER 5%, PR 5%, HER-2 negative, Ki-67 50%; T1cN0 stage IA clinical stage   01/18/2016 Surgery   Right lumpectomy: Multifocal IDC grade 3, 1.9 cm  ER 0%, PR 0%, HER-2 negative, Ki-67 70% and 0.8 cm satellite mass ER 5%, PR 2%, HER-2 negative, Ki-67 50%, high-grade DCIS, margins negative, 0/1 lymph nodes negative T1 cN0 stage IA   02/17/2016 - 04/06/2016 Chemotherapy   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 with above-mentioned history of right breast cancer treated with lumpectomy,adjuvant chemotherapy,radiation, and whois currently on antiestrogen therapywith exemestane.She also has a history of anemia, and previously received PRBCs. She is currently receiving Retacrit injections. She presents to the clinic today for Retacrit and to review her recent labs.   REVIEW OF SYSTEMS:   Constitutional: Denies fevers, chills or abnormal weight loss Eyes: Denies blurriness of vision Ears, nose, mouth, throat, and face: Denies mucositis or sore throat Respiratory: Denies cough, dyspnea or wheezes Cardiovascular: Denies palpitation, chest discomfort Gastrointestinal: Denies nausea, heartburn or change in bowel habits Skin: Denies abnormal skin rashes Lymphatics: Denies new lymphadenopathy or easy bruising Neurological: Denies numbness, tingling or new weaknesses Behavioral/Psych: Mood is stable, no new changes  Extremities: No lower extremity edema Breast: denies any pain or lumps or nodules in either breasts All other systems were reviewed with the patient and are negative.  I have reviewed the past medical history, past surgical history, social history and family history with the patient and they are unchanged from previous note.  ALLERGIES:  is allergic to amoxicillin; aldactone [spironolactone]; imdur [isosorbide dinitrate]; tape; arimidex [anastrozole]; latex; and tetracycline.  MEDICATIONS:  Current Outpatient Medications  Medication Sig Dispense Refill  . ACCU-CHEK GUIDE test strip Use to test blood sugar 3 times daily 100 each 3  . acetaminophen (TYLENOL) 325 MG tablet Take 2 tablets (650 mg  total) by mouth every 6 (six) hours as  needed for mild pain (or Fever >/= 101).    Marland Kitchen albuterol (PROVENTIL HFA;VENTOLIN HFA) 108 (90 Base) MCG/ACT inhaler Inhale 2 puffs into the lungs every 6 (six) hours as needed for wheezing or shortness of breath.    Marland Kitchen amiodarone (PACERONE) 200 MG tablet Take 1 tablet (200 mg total) by mouth daily. 90 tablet 3  . budesonide-formoterol (SYMBICORT) 160-4.5 MCG/ACT inhaler INHALE TWO PUFFS INTO THE LUNGS TWICE DAILY 3 Inhaler 1  . ELIQUIS 5 MG TABS tablet TAKE ONE TABLET BY MOUTH TWICE DAILY 60 tablet 6  . exemestane (AROMASIN) 25 MG tablet TAKE ONE TABLET BY MOUTH DAILY AFTER BREAKFAST 90 tablet 3  . ezetimibe (ZETIA) 10 MG tablet TAKE ONE TABLET BY MOUTH EVERY DAY 90 tablet 3  . famotidine (PEPCID) 20 MG tablet One after supper 30 tablet 11  . fludrocortisone (FLORINEF) 0.1 MG tablet Take 1 tablet (0.1 mg total) by mouth daily.    . furosemide (LASIX) 20 MG tablet 40 mg once a week - 20 mg the other 6 days 90 tablet 3  . insulin glargine (LANTUS) 100 UNIT/ML injection Inject 16 units under the skin once daily in the morning. 20 mL 3  . insulin lispro (HUMALOG) 100 UNIT/ML injection Inject 4-6 units under the skin three times daily before meals. 20 mL 3  . Insulin Pen Needle 32G X 4 MM MISC Use as instructed to inject insulin 4 times daily. 400 each 3  . loratadine (CLARITIN) 10 MG tablet Take 10 mg by mouth daily.    . metoprolol tartrate (LOPRESSOR) 25 MG tablet Take 1 tablet (25 mg total) by mouth 2 (two) times daily. 180 tablet 3  . nitroGLYCERIN (NITROSTAT) 0.4 MG SL tablet Place 1 tablet (0.4 mg total) under the tongue every 5 (five) minutes as needed for chest pain. 30 tablet 3  . pantoprazole (PROTONIX) 40 MG tablet Take 1 tablet (40 mg total) by mouth daily. 30 tablet 11  . potassium chloride SA (K-DUR) 20 MEQ tablet Take 1 tablet (20 mEq total) by mouth daily. 90 tablet 3  . rosuvastatin (CRESTOR) 40 MG tablet Take 1 tablet (40 mg total) by mouth at bedtime. 90 tablet 3  . zolpidem (AMBIEN)  5 MG tablet TAKE ONE TABLET BY MOUTH AT BEDTIME AS NEEDED FOR SLEEP 30 tablet 2   No current facility-administered medications for this visit.     PHYSICAL EXAMINATION: ECOG PERFORMANCE STATUS: 1 - Symptomatic but completely ambulatory  There were no vitals filed for this visit. There were no vitals filed for this visit.  GENERAL: alert, no distress and comfortable SKIN: skin color, texture, turgor are normal, no rashes or significant lesions EYES: normal, Conjunctiva are pink and non-injected, sclera clear OROPHARYNX: no exudate, no erythema and lips, buccal mucosa, and tongue normal  NECK: supple, thyroid normal size, non-tender, without nodularity LYMPH: no palpable lymphadenopathy in the cervical, axillary or inguinal LUNGS: clear to auscultation and percussion with normal breathing effort HEART: regular rate & rhythm and no murmurs and no lower extremity edema ABDOMEN: abdomen soft, non-tender and normal bowel sounds MUSCULOSKELETAL: no cyanosis of digits and no clubbing  NEURO: alert & oriented x 3 with fluent speech, no focal motor/sensory deficits EXTREMITIES: No lower extremity edema  LABORATORY DATA:  I have reviewed the data as listed CMP Latest Ref Rng & Units 02/05/2019 01/28/2019 01/19/2019  Glucose 65 - 99 mg/dL 203(H) 119(H) 98  BUN 8 - 27 mg/dL  22 27 22   Creatinine 0.57 - 1.00 mg/dL 1.98(H) 1.78(H) 2.12(H)  Sodium 134 - 144 mmol/L 140 144 144  Potassium 3.5 - 5.2 mmol/L 4.1 3.0(L) 2.8(LL)  Chloride 96 - 106 mmol/L 102 104 103  CO2 20 - 29 mmol/L 26 27 27   Calcium 8.7 - 10.3 mg/dL 8.5(L) 8.5(L) 8.5(L)  Total Protein 6.0 - 8.5 g/dL - - 5.5(L)  Total Bilirubin 0.0 - 1.2 mg/dL - - 0.7  Alkaline Phos 39 - 117 IU/L - - 55  AST 0 - 40 IU/L - - 38  ALT 0 - 32 IU/L - - 24    Lab Results  Component Value Date   WBC 5.8 02/06/2019   HGB 7.8 (L) 02/06/2019   HCT 26.9 (L) 02/06/2019   MCV 105.9 (H) 02/06/2019   PLT 110 (L) 02/06/2019   NEUTROABS 4.5 02/06/2019     ASSESSMENT & PLAN:  Breast cancer of upper-inner quadrant of right female breast ([place]) Patient has had longstanding anemia where the hemoglobin was between 10 to 12 g. Since April 2020 her hemoglobin has gotten worse and today it is at 6.6. During the same timeframe her creatinine got from 4.9-8.26 E15 folic acid and iron studies were normal. 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  Current treatment:Retacrit injectionsweekly started 01/09/2019 Lab review: Hemoglobin is 7.8, MCV 105.9  Return to clinic weekly for retacrit injections. I discussed with her that if the retacrit injection does not maintain her hemoglobin then we may have to consider doing a bone marrow biopsy. She tells me that long time ago she had a bone marrow biopsy with Dr. Arnoldo Morale.   Breast cancer: Doing well with exemestane.  Continue to closely monitor her blood counts.    She went to [place] for a cabin vacation.  She will not receive an injection that week.   Today is her 4th injection We will watch her blood counts for another 3 wks and then decide on doing a bone marrow biopsy.    No orders of the defined types were placed in this encounter.  The patient has a good understanding of the overall plan. she agrees with it. she will call with any problems that may develop before the next visit here.  Nicholas Lose, MD 02/06/2019  Hannah [name] am acting as scribe for Dr. Nicholas Lose.  I have reviewed the above documentation for accuracy and completeness, and I agree with the above.

## 2019-02-05 NOTE — Telephone Encounter (Signed)
Called patient and informed her to hold her Eliuis for 1 day prior to her procedure and to restart as soon as possible afterwards. Patient verbalized an understanding and all (if any) questions were answered.

## 2019-02-06 ENCOUNTER — Inpatient Hospital Stay (HOSPITAL_BASED_OUTPATIENT_CLINIC_OR_DEPARTMENT_OTHER): Payer: Medicare Other | Admitting: Hematology and Oncology

## 2019-02-06 ENCOUNTER — Inpatient Hospital Stay: Payer: Medicare Other

## 2019-02-06 ENCOUNTER — Other Ambulatory Visit: Payer: Self-pay

## 2019-02-06 ENCOUNTER — Other Ambulatory Visit: Payer: Medicare Other

## 2019-02-06 VITALS — BP 116/50 | HR 62 | Temp 98.5°F | Resp 17 | Ht 65.0 in | Wt 169.2 lb

## 2019-02-06 DIAGNOSIS — C50211 Malignant neoplasm of upper-inner quadrant of right female breast: Secondary | ICD-10-CM

## 2019-02-06 DIAGNOSIS — Z170 Estrogen receptor positive status [ER+]: Secondary | ICD-10-CM | POA: Diagnosis not present

## 2019-02-06 DIAGNOSIS — D638 Anemia in other chronic diseases classified elsewhere: Secondary | ICD-10-CM

## 2019-02-06 DIAGNOSIS — D649 Anemia, unspecified: Secondary | ICD-10-CM | POA: Diagnosis not present

## 2019-02-06 LAB — CBC WITH DIFFERENTIAL (CANCER CENTER ONLY)
Abs Immature Granulocytes: 0.01 10*3/uL (ref 0.00–0.07)
Basophils Absolute: 0 10*3/uL (ref 0.0–0.1)
Basophils Relative: 1 %
Eosinophils Absolute: 0.1 10*3/uL (ref 0.0–0.5)
Eosinophils Relative: 2 %
HCT: 26.9 % — ABNORMAL LOW (ref 36.0–46.0)
Hemoglobin: 7.8 g/dL — ABNORMAL LOW (ref 12.0–15.0)
Immature Granulocytes: 0 %
Lymphocytes Relative: 7 %
Lymphs Abs: 0.4 10*3/uL — ABNORMAL LOW (ref 0.7–4.0)
MCH: 30.7 pg (ref 26.0–34.0)
MCHC: 29 g/dL — ABNORMAL LOW (ref 30.0–36.0)
MCV: 105.9 fL — ABNORMAL HIGH (ref 80.0–100.0)
Monocytes Absolute: 0.7 10*3/uL (ref 0.1–1.0)
Monocytes Relative: 11 %
Neutro Abs: 4.5 10*3/uL (ref 1.7–7.7)
Neutrophils Relative %: 79 %
Platelet Count: 110 10*3/uL — ABNORMAL LOW (ref 150–400)
RBC: 2.54 MIL/uL — ABNORMAL LOW (ref 3.87–5.11)
RDW: 14.2 % (ref 11.5–15.5)
WBC Count: 5.8 10*3/uL (ref 4.0–10.5)
nRBC: 0 % (ref 0.0–0.2)

## 2019-02-06 LAB — SAMPLE TO BLOOD BANK

## 2019-02-06 MED ORDER — EPOETIN ALFA-EPBX 40000 UNIT/ML IJ SOLN
40000.0000 [IU] | Freq: Once | INTRAMUSCULAR | Status: AC
  Administered 2019-02-06: 40000 [IU] via SUBCUTANEOUS
  Filled 2019-02-06: qty 1

## 2019-02-06 NOTE — Patient Instructions (Signed)

## 2019-02-09 ENCOUNTER — Other Ambulatory Visit: Payer: Self-pay

## 2019-02-09 ENCOUNTER — Ambulatory Visit
Admission: RE | Admit: 2019-02-09 | Discharge: 2019-02-09 | Disposition: A | Discharge status: Home / Self Care | Payer: Medicare Other | Source: Ambulatory Visit | Attending: Hematology and Oncology | Admitting: Hematology and Oncology

## 2019-02-09 ENCOUNTER — Other Ambulatory Visit: Payer: Self-pay | Admitting: Hematology and Oncology

## 2019-02-09 ENCOUNTER — Other Ambulatory Visit: Payer: Self-pay | Admitting: Endocrinology

## 2019-02-09 ENCOUNTER — Other Ambulatory Visit (INDEPENDENT_AMBULATORY_CARE_PROVIDER_SITE_OTHER): Payer: Medicare Other

## 2019-02-09 ENCOUNTER — Telehealth: Payer: Self-pay | Admitting: Hematology and Oncology

## 2019-02-09 ENCOUNTER — Ambulatory Visit (INDEPENDENT_AMBULATORY_CARE_PROVIDER_SITE_OTHER): Payer: Medicare Other | Admitting: *Deleted

## 2019-02-09 DIAGNOSIS — N63 Unspecified lump in unspecified breast: Secondary | ICD-10-CM

## 2019-02-09 DIAGNOSIS — Z853 Personal history of malignant neoplasm of breast: Secondary | ICD-10-CM

## 2019-02-09 DIAGNOSIS — Z794 Long term (current) use of insulin: Secondary | ICD-10-CM

## 2019-02-09 DIAGNOSIS — R55 Syncope and collapse: Secondary | ICD-10-CM

## 2019-02-09 DIAGNOSIS — E1165 Type 2 diabetes mellitus with hyperglycemia: Secondary | ICD-10-CM

## 2019-02-09 LAB — GLUCOSE, RANDOM: Glucose, Bld: 82 mg/dL (ref 70–99)

## 2019-02-09 LAB — HEMOGLOBIN A1C: Hgb A1c MFr Bld: 5.6 % (ref 4.6–6.5)

## 2019-02-09 NOTE — Telephone Encounter (Signed)
I left a message regarding schedule  

## 2019-02-10 LAB — CUP PACEART REMOTE DEVICE CHECK
Date Time Interrogation Session: 20200831224002
Implantable Pulse Generator Implant Date: 20190322

## 2019-02-11 ENCOUNTER — Other Ambulatory Visit: Payer: Self-pay | Admitting: Hematology and Oncology

## 2019-02-11 ENCOUNTER — Other Ambulatory Visit: Payer: Medicare Other

## 2019-02-11 ENCOUNTER — Telehealth: Payer: Self-pay

## 2019-02-11 NOTE — Telephone Encounter (Signed)
Per Dr. Lovena Le it is ok to hold Eliquis for one day prior to procedure. Patient was notified, patient voiced understanding.

## 2019-02-12 ENCOUNTER — Other Ambulatory Visit: Payer: Self-pay | Admitting: Hematology and Oncology

## 2019-02-12 ENCOUNTER — Ambulatory Visit
Admission: RE | Admit: 2019-02-12 | Discharge: 2019-02-12 | Disposition: A | Discharge status: Home / Self Care | Payer: Medicare Other | Source: Ambulatory Visit | Attending: Hematology and Oncology | Admitting: Hematology and Oncology

## 2019-02-12 ENCOUNTER — Other Ambulatory Visit: Payer: Self-pay

## 2019-02-12 DIAGNOSIS — Z853 Personal history of malignant neoplasm of breast: Secondary | ICD-10-CM

## 2019-02-12 DIAGNOSIS — N63 Unspecified lump in unspecified breast: Secondary | ICD-10-CM

## 2019-02-12 HISTORY — PX: BREAST BIOPSY: SHX20

## 2019-02-13 ENCOUNTER — Other Ambulatory Visit: Payer: Self-pay

## 2019-02-13 ENCOUNTER — Ambulatory Visit (INDEPENDENT_AMBULATORY_CARE_PROVIDER_SITE_OTHER): Payer: Medicare Other | Admitting: Endocrinology

## 2019-02-13 ENCOUNTER — Other Ambulatory Visit: Payer: Self-pay | Admitting: Oncology

## 2019-02-13 ENCOUNTER — Encounter: Payer: Self-pay | Admitting: Endocrinology

## 2019-02-13 ENCOUNTER — Inpatient Hospital Stay: Payer: Medicare Other | Attending: Hematology and Oncology

## 2019-02-13 ENCOUNTER — Inpatient Hospital Stay: Payer: Medicare Other

## 2019-02-13 VITALS — BP 115/50 | Temp 98.0°F | Resp 16

## 2019-02-13 VITALS — BP 122/50 | HR 60 | Ht 65.0 in | Wt 166.0 lb

## 2019-02-13 DIAGNOSIS — D631 Anemia in chronic kidney disease: Secondary | ICD-10-CM | POA: Diagnosis not present

## 2019-02-13 DIAGNOSIS — I129 Hypertensive chronic kidney disease with stage 1 through stage 4 chronic kidney disease, or unspecified chronic kidney disease: Secondary | ICD-10-CM | POA: Diagnosis not present

## 2019-02-13 DIAGNOSIS — Z79899 Other long term (current) drug therapy: Secondary | ICD-10-CM | POA: Insufficient documentation

## 2019-02-13 DIAGNOSIS — N184 Chronic kidney disease, stage 4 (severe): Secondary | ICD-10-CM | POA: Diagnosis not present

## 2019-02-13 DIAGNOSIS — E1165 Type 2 diabetes mellitus with hyperglycemia: Secondary | ICD-10-CM | POA: Diagnosis not present

## 2019-02-13 DIAGNOSIS — Z170 Estrogen receptor positive status [ER+]: Secondary | ICD-10-CM

## 2019-02-13 DIAGNOSIS — Z794 Long term (current) use of insulin: Secondary | ICD-10-CM | POA: Diagnosis not present

## 2019-02-13 DIAGNOSIS — D638 Anemia in other chronic diseases classified elsewhere: Secondary | ICD-10-CM

## 2019-02-13 DIAGNOSIS — C50211 Malignant neoplasm of upper-inner quadrant of right female breast: Secondary | ICD-10-CM

## 2019-02-13 LAB — CBC WITH DIFFERENTIAL (CANCER CENTER ONLY)
Abs Immature Granulocytes: 0.02 10*3/uL (ref 0.00–0.07)
Basophils Absolute: 0 10*3/uL (ref 0.0–0.1)
Basophils Relative: 1 %
Eosinophils Absolute: 0.1 10*3/uL (ref 0.0–0.5)
Eosinophils Relative: 3 %
HCT: 28.7 % — ABNORMAL LOW (ref 36.0–46.0)
Hemoglobin: 8.3 g/dL — ABNORMAL LOW (ref 12.0–15.0)
Immature Granulocytes: 0 %
Lymphocytes Relative: 10 %
Lymphs Abs: 0.4 10*3/uL — ABNORMAL LOW (ref 0.7–4.0)
MCH: 30.2 pg (ref 26.0–34.0)
MCHC: 28.9 g/dL — ABNORMAL LOW (ref 30.0–36.0)
MCV: 104.4 fL — ABNORMAL HIGH (ref 80.0–100.0)
Monocytes Absolute: 0.6 10*3/uL (ref 0.1–1.0)
Monocytes Relative: 14 %
Neutro Abs: 3.3 10*3/uL (ref 1.7–7.7)
Neutrophils Relative %: 72 %
Platelet Count: 102 10*3/uL — ABNORMAL LOW (ref 150–400)
RBC: 2.75 MIL/uL — ABNORMAL LOW (ref 3.87–5.11)
RDW: 14.1 % (ref 11.5–15.5)
WBC Count: 4.5 10*3/uL (ref 4.0–10.5)
nRBC: 0 % (ref 0.0–0.2)

## 2019-02-13 LAB — SAMPLE TO BLOOD BANK

## 2019-02-13 MED ORDER — EPOETIN ALFA-EPBX 40000 UNIT/ML IJ SOLN
40000.0000 [IU] | Freq: Once | INTRAMUSCULAR | Status: AC
  Administered 2019-02-13: 40000 [IU] via SUBCUTANEOUS
  Filled 2019-02-13: qty 1

## 2019-02-13 MED ORDER — ACCU-CHEK AVIVA PLUS W/DEVICE KIT: PACK | 0 refills | Status: DC

## 2019-02-13 NOTE — Patient Instructions (Signed)

## 2019-02-13 NOTE — Patient Instructions (Addendum)
Lantus 16 units in am  HUMALOG 7-8 units for more carbs at supper/sweets and always before each meal

## 2019-02-13 NOTE — Progress Notes (Signed)
Patient ID: Hannah [name], female   DOB: [birth year], 73 y.o.   MRN: [id number]           Reason for Appointment: Follow-up for Type 2 Diabetes   History of Present Illness:          Date of diagnosis of type 2 diabetes mellitus: Late 1980s        Background history:  She was previously treated with metformin prior to starting insulin She thinks she has been on insulin for at least 10 years and mostly taking Lantus and other types of insulin with it Her metformin was stopped when she started insulin and also has had renal dysfunction Previously her A1c had been consistently over 8% and occasionally higher.  Recent history:   INSULIN regimen is described as:  Lantus 18 units in am, Humalog 4-6 units before meals  Non-insulin hypoglycemic drugs the patient is taking are: None, previously was on Trulicity 1.5 mg weekly  She had been on the V-go pump since 03/2015, this was stopped in 3/20  Current diabetes management, blood sugar patterns and problems identified:   Her A1c is lower than expected at 5.6, likely from anemia  She is continuing to be using Lantus and Humalog  Lantus was increased by 2 units when she called about blood sugars in the mornings being about 140  Fasting readings are excellent now with average of 128  However she is having frequent high blood sugars after dinner and sometimes after lunch  This is partly related to her forgetting to take her Humalog before meals  She checks blood sugars mostly fasting and bedtime  Likely does not take enough insulin for her evening meal since her average blood sugar is nearly 200  Does not adjust her insulin if she is eating higher carbohydrate meals like pasta or some desserts  Her weight is about the same recently  She was previously more active with going to the gym but now is only doing a little walking  No hypoglycemia with lowest blood sugar 67 overnight at 3 AM but otherwise does not report frequent low  sugars overnight  Blood sugar analysis as below:  Side effects from medications have been: None  Compliance with the medical regimen: Fair  Glucose monitoring:  done about 2-3 times a day         Glucometer:  Accu-Chek Aviva Blood Glucose readings by download for the last 4 weeks:   PRE-MEAL Fasting Lunch Dinner  PCS/at bedtime Overall  Glucose range:  81-163    71-288   Mean/median: 128    196 158   POST-MEAL PC Breakfast PC Lunch PC Dinner  Glucose range:   162-310   Mean/median:   170    Previous readings:  PRE-MEAL Fasting Lunch Dinner Bedtime Overall  Glucose range: 83-142    128-192   Mean/median:  116    160 142   POST-MEAL PC Breakfast PC Lunch PC Dinner  Glucose range:   166-329  104-162  Mean/median:   160  150     Self-care: The diet that the patient has been following is: tries to limit fats and carbs.  She uses diet green tea or diet drinks and no sugar in drinks    Lunch 1-2 pm Dinner 6-7 PM  Typical meal intake: Breakfast is oatrmeal or cheese toast              Dietician visit, most recent: 5/16 And in 7/16 with oncology dietitian  Weight history:  Wt Readings from Last 3 Encounters:  02/13/19 166 lb (75.3 kg)  02/06/19 169 lb 3.2 oz (76.7 kg)  02/02/19 164 lb 8 oz (74.6 kg)    Glycemic control:      Lab Results  Component Value Date   HGBA1C 5.6 02/09/2019   HGBA1C 6.8 (H) 11/18/2018   HGBA1C 6.1 08/13/2018   Lab Results  Component Value Date   MICROALBUR 53.4 (H) 05/12/2018   LDLCALC 53 12/02/2018   CREATININE 1.98 (H) 02/05/2019       Allergies as of 02/13/2019      Reactions   Amoxicillin Rash, Other (See Comments)   Tolerates Cephalosporins Has patient had a PCN reaction causing immediate rash, facial/tongue/throat swelling, SOB or lightheadedness with hypotension: No Has patient had a PCN reaction causing severe rash involving mucus membranes or skin necrosis: Yes Has patient had a PCN reaction that required  hospitalization No Has patient had a PCN reaction occurring within the last 10 years: No If all of the above answers are "NO", then may proceed with Cephalosporin use.   Aldactone [spironolactone] Other (See Comments)   CKD/hypokalemia   Imdur [isosorbide Dinitrate] Other (See Comments)   Headache/severe hypotension/Syncope   Tape Other (See Comments)   Must use paper tape   Arimidex [anastrozole] Nausea Only   Latex Itching, Other (See Comments)   Dentist office only   Tetracycline Rash      Medication List       Accurate as of February 13, 2019 11:59 PM. If you have any questions, ask your nurse or doctor.        Accu-Chek Aviva Plus w/Device Kit Use accu chek aviva meter to check blood sugar three times daily. What changed:   how much to take  how to take this  when to take this Changed by: Linus Galas, CMA   Accu-Chek Guide test strip Generic drug: glucose blood Use to test blood sugar 3 times daily   acetaminophen 325 MG tablet Commonly known as: TYLENOL Take 2 tablets (650 mg total) by mouth every 6 (six) hours as needed for mild pain (or Fever >/= 101).   albuterol 108 (90 Base) MCG/ACT inhaler Commonly known as: VENTOLIN HFA Inhale 2 puffs into the lungs every 6 (six) hours as needed for wheezing or shortness of breath.   amiodarone 200 MG tablet Commonly known as: PACERONE Take 1 tablet (200 mg total) by mouth daily.   budesonide-formoterol 160-4.5 MCG/ACT inhaler Commonly known as: Symbicort INHALE TWO PUFFS INTO THE LUNGS TWICE DAILY   Eliquis 5 MG Tabs tablet Generic drug: apixaban TAKE ONE TABLET BY MOUTH TWICE DAILY   exemestane 25 MG tablet Commonly known as: AROMASIN TAKE ONE TABLET BY MOUTH DAILY AFTER BREAKFAST   ezetimibe 10 MG tablet Commonly known as: ZETIA TAKE ONE TABLET BY MOUTH EVERY DAY   famotidine 20 MG tablet Commonly known as: Pepcid One after supper   fludrocortisone 0.1 MG tablet Commonly known as: FLORINEF Take 1  tablet (0.1 mg total) by mouth daily.   furosemide 20 MG tablet Commonly known as: LASIX 40 mg once a week - 20 mg the other 6 days   insulin glargine 100 UNIT/ML injection Commonly known as: LANTUS Inject 16 units under the skin once daily in the morning. What changed: additional instructions   insulin lispro 100 UNIT/ML injection Commonly known as: HUMALOG Inject 4-6 units under the skin three times daily before meals. What changed: additional instructions   Insulin Pen  Needle 32G X 4 MM Misc Use as instructed to inject insulin 4 times daily.   loratadine 10 MG tablet Commonly known as: CLARITIN Take 10 mg by mouth daily.   metoprolol tartrate 25 MG tablet Commonly known as: LOPRESSOR Take 1 tablet (25 mg total) by mouth 2 (two) times daily.   nitroGLYCERIN 0.4 MG SL tablet Commonly known as: NITROSTAT Place 1 tablet (0.4 mg total) under the tongue every 5 (five) minutes as needed for chest pain.   pantoprazole 40 MG tablet Commonly known as: Protonix Take 1 tablet (40 mg total) by mouth daily.   potassium chloride SA 20 MEQ tablet Commonly known as: K-DUR Take 1 tablet (20 mEq total) by mouth daily.   rosuvastatin 40 MG tablet Commonly known as: CRESTOR Take 1 tablet (40 mg total) by mouth at bedtime.   zolpidem 5 MG tablet Commonly known as: AMBIEN TAKE ONE TABLET BY MOUTH AT BEDTIME AS NEEDED FOR SLEEP       Allergies:  Allergies  Allergen Reactions  . Amoxicillin Rash and Other (See Comments)    Tolerates Cephalosporins Has patient had a PCN reaction causing immediate rash, facial/tongue/throat swelling, SOB or lightheadedness with hypotension: No Has patient had a PCN reaction causing severe rash involving mucus membranes or skin necrosis: Yes Has patient had a PCN reaction that required hospitalization No Has patient had a PCN reaction occurring within the last 10 years: No If all of the above answers are "NO", then may proceed with Cephalosporin  use.   . Aldactone [Spironolactone] Other (See Comments)    CKD/hypokalemia  . Imdur [Isosorbide Dinitrate] Other (See Comments)    Headache/severe hypotension/Syncope  . Tape Other (See Comments)    Must use paper tape  . Arimidex [Anastrozole] Nausea Only  . Latex Itching and Other (See Comments)    Dentist office only  . Tetracycline Rash    Past Medical History:  Diagnosis Date  . Abnormally small mouth   . Acute on chronic combined systolic and diastolic congestive heart failure ([place]) 07/17/2013  . Adrenal insufficiency ([place]) 07/03/2016  . AKI (acute kidney injury) ([place]) 09/01/2017  . Allergic rhinitis 10/14/2009   Qualifier: Diagnosis of  By: Lamonte Sakai MD, Rose Fillers   Overview:  Overview:  Qualifier: Diagnosis of  By: Lamonte Sakai MD, Rose Fillers  Last Assessment & Plan:  Please continue Xyzal and Nasacort as you have been using them  . Anemia   . Asthma 05/12/2009   10/12/2014 p extensive coaching HFA effectiveness =    75% s spacer    Overview:  Overview:  10/12/2014 p extensive coaching HFA effectiveness =    75% s spacer   Last Assessment & Plan:  Please continue Symbicort 2 puffs twice a day. Remember to rinse and gargle after taking this medication. Take albuterol 2 puffs up to every 4 hours if needed for shortness of breath.  Follow with Dr Lamonte Sakai in 6 month  . Atrial tachycardia ([place]) 04/03/2013   Overview:  Last Assessment & Plan:  I have discussed the likely benign nature of her problem. She has very minimal palpitations. I doubt atrial flutter. I would not anti-coagulate. Her monitor demonstrates that her episodes are short lived. Will follow.   . Bilateral carotid artery stenosis    Bilateral ICA 40-59%/  >50% LECA  . Breast cancer ([place]) 01/18/2016   right breast  . Breast cancer of upper-inner quadrant of right female breast ([place]) 12/16/2015  . CAD (coronary artery disease) cardiologist-  dr  mclean   Remote CABG in 1998 with redo in 2009  . Chemotherapy induced nausea and vomiting 02/24/2016   . 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 CHF (congestive heart failure) (H[facility])    EF is low normal at 50 to 55% per echo in Jan. 2011  . Chronic coronary artery disease 01/24/2011   Overview:  Last Assessment & Plan:  Stable with no ischemic symptoms.  Continue ASA, statin, ACEI, beta blocker.   . Chronic diastolic CHF (congestive heart failure) ([place]) 09/24/2013  . CKD (chronic kidney disease), stage IV ([place]) 09/24/2013  . Closed fracture of head of left humerus 09/09/2017  . Complication of anesthesia   . Essential hypertension 05/12/2009   Qualifier: Diagnosis of  By: Lamonte Sakai MD, Rose Fillers   . Gallstones   . GERD (gastroesophageal reflux disease)   . Glaucoma   . H/O atrial tachycardia 05/12/2009   Qualifier: History of  By: Lamonte Sakai MD, Rose Fillers   Overview:  Overview:  Qualifier: History of  By: Lamonte Sakai MD, Rose Fillers  Last Assessment & Plan:  There was no evidence of this on her cardiac monitor. I would recommend watchful waiting.   Marland Kitchen Hearing loss of right ear   . History of aortic valve replacement 10/27/2010   Overview:  Last Assessment & Plan:  Will get echo to reassess bioprosthetic aortic valve.   Marland Kitchen History of non-ST elevation myocardial infarction (NSTEMI)    Sept 2014--  thought to be type II HTN w/ LHC without infarct related artery and patent grafts  . History of radiation therapy 05/24/16-07/26/16   right breast 50.4 Gy in 28 fractions, right breast boost 10 Gy in 5 fractions  . Hyperlipidemia   . Hypertension    Resolved.  . Iron deficiency anemia   . Mania ([place]) 03/05/2016  . Moderate persistent asthma    pulmologist-  Dr. Malvin Johns  . Nonischemic cardiomyopathy ([place])   . Osteomyelitis of toe of left foot ([place]) 04/06/2016  . Osteopenia of multiple sites 10/19/2015  . PAF (paroxysmal atrial fibrillation) ([place])   . Personal history of chemotherapy 2017  . Personal history of radiation therapy 2017  . PONV  (postoperative nausea and vomiting)   . Port catheter in place 02/17/2016  . Psoriasis    right leg  . Renal calculus, right   . Renal insufficiency, mild   . S/P AVR    prosthesis valve placement 2009 at same time re-do CABG  . Seizure ([place]) 06/29/2016  . Seizure-like activity ([place]) 09/01/2017  . Sensorineural hearing loss (SNHL) of both ears 01/06/2016  . Stroke [facility])    residual rt hearing loss  . Syncopal episodes 05/21/2018  . Syncope 09/06/2017  . Type 2 diabetes mellitus ([place])    monitored by dr Dwyane Dee    Past Surgical History:  Procedure Laterality Date  . AORTIC VALVE REPLACEMENT  [facility]   [facility] same time is [facility]  . BREAST LUMPECTOMY Right 01/18/2016  . BREAST LUMPECTOMY WITH RADIOACTIVE SEED AND SENTINEL LYMPH NODE BIOPSY Right 01/18/2016   Procedure: RIGHT BREAST LUMPECTOMY WITH RADIOACTIVE SEED AND SENTINEL LYMPH NODE BIOPSY;  Surgeon: DAlphonsa Overall MD;  [facility]  Service: General;  Laterality: Right;  . CARDIAC CATHETERIZATION  03/23/2008   Pre-redo CABG: L main OK, LAD (T), CFX (T), OM1 99%, RCA (T), LIMA-LAD OK, SVG-OM(?3) OK w/ little florw to OM2, SVG-RCA OK. EF NL  . CARPAL TUNNEL RELEASE    .  CHOLECYSTECTOMY N/A 07/07/2018   Procedure: LAPAROSCOPIC CHOLECYSTECTOMY WITH INTRAOPERATIVE CHOLANGIOGRAM ERAS PATHWAY;  Surgeon: Alphonsa Overall, MD;  Location: [facility];  Service: General;  Laterality: N/A;  . COLONOSCOPY     around 2015. Possibly with [facility]   . CORONARY ARTERY BYPASS GRAFT  1998 &  re-do 2009   Had LIMA to DX/LAD, SVG to 2 marginal branches and SVG to [facility] originally; SVG to 3rd OM and PD at time of redo  . CYSTOSCOPY W/ URETERAL STENT PLACEMENT Right 12/20/2014   Procedure: CYSTOSCOPY WITH RETROGRADE PYELOGRAM/URETERAL STENT PLACEMENT;  Surgeon: Cleon Gustin, MD;  Location: [facility];  Service: Urology;  Laterality: Right;  . ESOPHAGOGASTRODUODENOSCOPY     many years ago per patient   .  ESOPHAGOGASTRODUODENOSCOPY ENDOSCOPY  06/17/2018  . EYE SURGERY Bilateral    cataracts  . HOLMIUM LASER APPLICATION Right [id number]/48/8891   Procedure:  HOLMIUM LASER LITHOTRIPSY;  Surgeon: Cleon Gustin, MD;  [facility];  Service: Urology;  Laterality: Right;  . IR THORACENTESIS ASP PLEURAL SPACE W/IMG GUIDE  10/03/2018  . LEFT HEART CATHETERIZATION WITH CORONARY/GRAFT ANGIOGRAM N/A 02/23/2013   Procedure: LEFT HEART CATHETERIZATION WITH Beatrix Fetters;  Surgeon: Blane Ohara, MD;  Location: [facility] CATH LAB;  Service: Cardiovascular;  Laterality: N/A;  . LOOP RECORDER INSERTION N/A 08/30/2017   Procedure: LOOP RECORDER INSERTION;  Surgeon: Evans Lance, MD;  Location: [place] [facility] CV LAB;  Service: Cardiovascular;  Laterality: N/A;  . PORTACATH PLACEMENT Left 01/18/2016   Procedure: INSERTION PORT-A-CATH;  Surgeon: Alphonsa Overall, MD;  Location: [place];  Service: General;  Laterality: Left;  . portacath removal    . TONSILLECTOMY    . TRANSTHORACIC ECHOCARDIOGRAM  02-24-2013      mild LVH,  ef 50-55%/  AV bioprosthesis was present with very mild stenosis and no regurg., mean grandient 47mHg, peak grandient 262mg /  mild MR/  mild LAE and RAE/  moderate TR  . TUBAL LIGATION      Family History  Problem Relation Age of Onset  . Heart disease Father   . Heart failure Father   . Diabetes Maternal Grandmother   . Heart disease Maternal Grandmother   . Diabetes Son   . Healthy Brother        #1  . Heart attack Brother        #2  . Heart disease Brother        #2  . Colon cancer Neg Hx   . Esophageal cancer Neg Hx     Social History:  reports that she has never smoked. She has never used smokeless tobacco. She reports that she does not drink alcohol or use drugs.    Review of Systems    Lipid history: On 40 mg Crestor along with Zetia      Lab Results  Component Value Date   CHOL 106 12/02/2018   HDL 34 (L) 12/02/2018   LDLCALC 53 12/02/2018    LDLDIRECT 53.0 10/08/2016   TRIG 97 12/02/2018   CHOLHDL 3.1 12/02/2018           Eyes:  No history of retinopathy  She is followed by nephrologist for renal dysfunction which shows variable results    Lab Results  Component Value Date   CREATININE 1.98 (H) 02/05/2019   CREATININE 1.78 (H) 01/28/2019   CREATININE 2.12 (H) 01/19/2019    Diabetic foot exam in 3/20: She has significant loss of monofilament sensation in her distal  feet She thinks her neuropathy started after her chemotherapy Also has some difficulty with balance  BLOOD pressure: She monitors at home   Cardiologist is treating with Florinef for history of syncope However she is also taking Lasix for edema from cardiologist  BP Readings from Last 3 Encounters:  02/13/19 (!) 122/50  02/13/19 (!) 115/50  02/06/19 (!) 116/50     Physical Examination:  BP (!) 122/50 (BP Location: Left Arm, Patient Position: Sitting, Cuff Size: Normal)   Pulse 60   Ht _0  (1.651 m)   Wt 166 lb (75.3 kg)   SpO2 98%   BMI 27.62 kg/m     ASSESSMENT:  Diabetes type 2, on insulin pump    See history of present illness for detailed discussion of  current management, blood sugar patterns and problems identified  Her A1c is relatively lower at 5.6 likely to be from her recent anemia  At 6.8, previously at 6.1  Considering her age, multiple medical problems her blood sugars are doing reasonably well  However she is not taking enough insulin most of the time to cover her evening meal but this depends on the her intake She is not adjusting her evening meal coverage and likely needs 7 or 8 units for larger meals She has had low normal readings and occasional overnight hypoglycemia with current dose of Lantus even though she takes this in the morning Currently not on any non-insulin drugs  RENAL dysfunction/blood pressure: Followed by other physicians  She is on Lasix and Florinef at the same time, reportedly has  orthostasis    PLAN:     Reduce Lantus back to 16  Although she may do better with [place] or [place] this may not be covered as well and will check on the next visit  Again asked her to increase her suppertime dose to 7 or 8 units when she is eating a relatively larger meal in the evening  She will try to remember to take her insulin before each meal consistently  She can have a bedtime snack if eating evening meal early  Likely need 46 units if eating a sandwich at lunch We will check fructosamine also to assess blood sugar control better as A1c is falsely low      Patient Instructions  Lantus 16 units in am  HUMALOG 7-8 units for more carbs at supper/sweets and always before each meal           Elayne Snare 02/15/2019, 12:03 PM   Note: This office note was prepared with Dragon voice recognition system technology. Any transcriptional errors that result from this process are unintentional.

## 2019-02-17 ENCOUNTER — Other Ambulatory Visit: Payer: Self-pay

## 2019-02-17 ENCOUNTER — Ambulatory Visit (HOSPITAL_BASED_OUTPATIENT_CLINIC_OR_DEPARTMENT_OTHER)
Admission: RE | Admit: 2019-02-17 | Discharge: 2019-02-17 | Disposition: A | Discharge status: Home / Self Care | Payer: Medicare Other | Source: Ambulatory Visit | Attending: Gastroenterology | Admitting: Gastroenterology

## 2019-02-17 DIAGNOSIS — D649 Anemia, unspecified: Secondary | ICD-10-CM

## 2019-02-17 DIAGNOSIS — I110 Hypertensive heart disease with heart failure: Secondary | ICD-10-CM | POA: Diagnosis not present

## 2019-02-17 DIAGNOSIS — R0602 Shortness of breath: Secondary | ICD-10-CM | POA: Diagnosis not present

## 2019-02-18 ENCOUNTER — Telehealth: Payer: Self-pay | Admitting: Physician Assistant

## 2019-02-18 ENCOUNTER — Encounter: Payer: Self-pay | Admitting: Physician Assistant

## 2019-02-18 NOTE — Progress Notes (Addendum)
Cardiology Office Note    Date:  02/19/2019   ID:  DECLYN OFFIELD, DOB 1946-06-03, MRN [id number]  PCP:  Mackie Pai, PA-C  Cardiologist:  Truitt Merle, NP  Electrophysiologist:  [name], MD  Nephrology: Dr Posey Pronto  Chief Complaint: f/u SOB, orthopnea  History of Present Illness:   Hannah [name] is a 73 y.o. female with history of CAD s/p CABG (1998 with redo 2009), bioprosthetic AVR in 0762, chronic diastolic CHF, asthma, carotid artery disease (1-39% by duplex 10/2017, due 2021), CKD stage IV, PAF, atrial tachycardia, breast CA, orthostasis, anemia, thrombocytopenia, mild dilation of ascending aorta by echo 10/2018, recurrent syncope, pleural effusion requiring thoracentesis 09/2018, anemia, HTN, GERD, HLD, mania, psoriasis, stroke per PMH, seizure like activity who presents for evaluation of SOB.   She is well known to Truitt Merle with excellent notes outlining extensive tumultous history.   In summary of more recent events, she has has intermittent issues with atrial arrhythmias, fluid overload c/b orthostasis and syncope (without arrhythmia on loop recorder), and pleural effusion requiring thoracentesis in 09/2018 (benign pathology). She was on midodrine in the past but had systemic hypertension with remotely so it was discontinued. She was admitted in 2019 with orthostasis and placed on Florinef instead. There was mention of potential adrenal insufficiency but hospital notes indicate she was tested for this several times and it was ruled out. Aldactone was previously stopped due to orthostasis, weakness and worsening kidney function. She had worsening anemia over the summer and heme/onc started Aranesp. They are concerned this is related to CKD vs MDS so plan for a future bone marrow biopsy. She was also referred to GI and is pending colonoscopy. She also has seen pulmonology for her pleural effusion. Notes indicate this was previously possibly felt related to surgery. Repeat CXR  by pulmonary in 01/2019 showed a small recurrent right pleural effusion. Cardiology f/u was encouraged. At last OV with [name] she was felt to be failing to thrive overall but holding her own - was in NSR at that visit. Chronic combined CHF was listed in her prior history although in recent years her EF has fortunately been normal. Last echo in 10/2018 showed EF 60-65%, pseudonormalization, normal RV function with mildly enlarged cavity, mild dilation of ascending aorta, AVR with no AI and mean gradient 18, mild MR. Last labs 02/2019 showed Hgb 8.3, plt 102, 01/2019 A1C 5.6, K 4.1, Cr 1.98 (c/w prior), LDL 53. She had an abdominal US 02/17/19 unrelated to this visit showing small right pleural effusion and mild ascites.  She sent in a MyChart message because she has begun feeling the same kind of symptoms she had when she required her thoracentesis, though not quite as bad. This has been over the span of the last 2 weeks or so. She describes dyspnea on exertion and orthopnea. She took 97m of Lasix yesterday with some improvement in symptoms. She has not gained a significant amount of weight. She has actually been progressively losing weight - was 202lb in 2018, now165lb. She has mild chronic edema which she states is stable. No chest pain or palpitations.   Past Medical History:  Diagnosis Date   Abnormally small mouth    Allergic rhinitis 10/14/2009   Qualifier: Diagnosis of  By: BLamonte SakaiMD, RRose Fillers  Overview:  Overview:  Qualifier: Diagnosis of  By: BLamonte SakaiMD, RRose Fillers Last Assessment & Plan:  Please continue Xyzal and Nasacort as you have been using them  Anemia    Asthma 05/12/2009   10/12/2014 p extensive coaching HFA effectiveness =    75% s spacer    Overview:  Overview:  10/12/2014 p extensive coaching HFA effectiveness =    75% s spacer   Last Assessment & Plan:  Please continue Symbicort 2 puffs twice a day. Remember to rinse and gargle after taking this medication. Take albuterol 2 puffs up to every 4  hours if needed for shortness of breath.  Follow with Dr Lamonte Sakai in 6 month   Bilateral carotid artery stenosis    a. mild 1-39% by duplex 10/2017, due 2021.   Breast cancer ([place]) 01/18/2016   right breast   CAD (coronary artery disease) 01/24/2011   a. s/p CABG 1998 with redo 2009.   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   Chemotherapy-induced thrombocytopenia 04/06/2016   Chronic diastolic CHF (congestive heart failure) ([place]) 09/24/2013   CKD (chronic kidney disease), stage IV ([place]) 09/24/2013   Closed fracture of head of left humerus 0/09/5407   Complication of anesthesia    Essential hypertension 05/12/2009   Qualifier: Diagnosis of  By: Lamonte Sakai MD, Rose Fillers    Gallstones    GERD (gastroesophageal reflux disease)    Glaucoma    H/O atrial tachycardia 05/12/2009   Qualifier: History of  By: Lamonte Sakai MD, Rose Fillers   Overview:  Overview:  Qualifier: History of  By: Lamonte Sakai MD, Rose Fillers  Last Assessment & Plan:  There was no evidence of this on her cardiac monitor. I would recommend watchful waiting.    History of non-ST elevation myocardial infarction (NSTEMI)    Sept 2014--  thought to be type II HTN w/ LHC without infarct related artery and patent grafts   History of radiation therapy 05/24/16-07/26/16   right breast 50.4 Gy in 28 fractions, right breast boost 10 Gy in 5 fractions   Hyperlipidemia    Hypertension    Resolved.   Iron deficiency anemia    Mania ([place]) 03/05/2016   Moderate persistent asthma    pulmologist-  Dr. Malvin Johns   Osteomyelitis of toe of left foot ([place]) 04/06/2016   Osteopenia of multiple sites 10/19/2015   PAF (paroxysmal atrial fibrillation) ([place])    Personal history of chemotherapy 2017   Personal history of radiation therapy 2017   PONV (postoperative nausea and vomiting)    Port catheter in place 02/17/2016   Psoriasis    right leg   Renal calculus, right    S/P  AVR    prosthesis valve placement 2009 at same time re-do CABG   Seizure-like activity ([place]) 09/01/2017   Sensorineural hearing loss (SNHL) of both ears 01/06/2016   Stroke ([place])    residual rt hearing loss   Syncope 09/06/2017   Type 2 diabetes mellitus ([place])    monitored by dr Dwyane Dee    Past Surgical History:  Procedure Laterality Date   [facility]ORTIC VALVE [facility]ACEMENT  [facility]   [facility]done same time is [facility]G)   BREAST LUMPECTOMY Right 01/18/2016   BREAST LUMPECTOMY WITH RADIOACTIVE SEED AND SENTINEL LYMPH NODE BIOPSY Right 01/18/2016   Procedure: RIGHT BREAST LUMPECTOMY WITH RADIOACTIVE SEED AND SENTINEL LYMPH NODE BIOPSY;  Surgeon: DAlphonsa Overall MD;  [facility]  Service: General;  Laterality: Right;   CARDIAC CATHETERIZATION  03/23/2008   Pre-redo CABG: L main OK, LAD (T), CFX (T), OM1 99%, RCA (T), LIMA-LAD OK, SVG-OM(?3) OK w/ little florw  to OM2, SVG-RCA OK. EF NL   CARPAL TUNNEL RELEASE     CHOLECYSTECTOMY N/A 07/07/2018   Procedure: LAPAROSCOPIC CHOLECYSTECTOMY WITH INTRAOPERATIVE CHOLANGIOGRAM ERAS PATHWAY;  Surgeon: Alphonsa Overall, MD;  Location: [facility];  Service: General;  Laterality: N/A;   COLONOSCOPY     around 2015. Possibly with [place] &  re-do 2009   Had LIMA to DX/LAD, SVG to 2 marginal branches and SVG to [facility] originally; SVG to 3rd OM and PD at time of redo   CYSTOSCOPY W/ URETERAL STENT PLACEMENT Right 12/20/2014   Procedure: CYSTOSCOPY WITH RETROGRADE PYELOGRAM/URETERAL STENT PLACEMENT;  Surgeon: Cleon Gustin, MD;  Location[facility];  Service: Urology;  Laterality: Right;   ESOPHAGOGASTRODUODENOSCOPY     many years ago per patient    ESOPHAGOGASTRODUODENOSCOPY ENDOSCOPY  06/17/2018   EYE SURGERY Bilateral    cataracts   HOLMIUM LASER APPLICATION Right 06/29/4172   Procedure:  HOLMIUM LASER LITHOTRIPSY;  Surgeon: Cleon Gustin, MD;  Location: [facility] [place];  Service: Urology;  Laterality: Right;   IR THORACENTESIS ASP PLEURAL SPACE W/IMG GUIDE  10/03/2018   LEFT HEART CATHETERIZATION WITH CORONARY/GRAFT ANGIOGRAM N/A 02/23/2013   Procedure: LEFT HEART CATHETERIZATION WITH Beatrix Fetters;  Surgeon: Blane Ohara, MD;  Location: [facility] CATH LAB;  Service: Cardiovascular;  Laterality: N/A;   LOOP RECORDER INSERTION N/A 08/30/2017   Procedure: LOOP RECORDER INSERTION;  Surgeon: Evans Lance, MD;  Location: [facility];  Service: Cardiovascular;  Laterality: N/A;   PORTACATH PLACEMENT Left 01/18/2016   Procedure: INSERTION PORT-A-CATH;  Surgeon: Alphonsa Overall, MD;  Location: [place];  Service: General;  Laterality: Left;   portacath removal     TONSILLECTOMY     TRANSTHORACIC ECHOCARDIOGRAM  02-24-2013      mild LVH,  ef 50-55%/  AV bioprosthesis was present with very mild stenosis and no regurg., mean grandient 84mHg, peak grandient 245mg /  mild MR/  mild LAE and RAE/  moderate TR   TUBAL LIGATION      Current Medications: Current Meds  Medication Sig   ACCU-CHEK GUIDE test strip Use to test blood sugar 3 times daily   acetaminophen (TYLENOL) 325 MG tablet Take 2 tablets (650 mg total) by mouth every 6 (six) hours as needed for mild pain (or Fever >/= 101).   albuterol (PROVENTIL HFA;VENTOLIN HFA) 108 (90 Base) MCG/ACT inhaler Inhale 2 puffs into the lungs every 6 (six) hours as needed for wheezing or shortness of breath.   amiodarone (PACERONE) 200 MG tablet Take 1 tablet (200 mg total) by mouth daily.   Blood Glucose Monitoring Suppl (ACCU-CHEK AVIVA PLUS) w/Device KIT Use accu chek aviva meter to check blood sugar three times daily.   budesonide-formoterol (SYMBICORT) 160-4.5 MCG/ACT inhaler INHALE TWO PUFFS INTO THE LUNGS TWICE DAILY   ELIQUIS 5 MG TABS tablet TAKE ONE TABLET BY MOUTH TWICE DAILY   exemestane (AROMASIN) 25 MG tablet TAKE ONE TABLET BY MOUTH DAILY AFTER BREAKFAST   ezetimibe  (ZETIA) 10 MG tablet TAKE ONE TABLET BY MOUTH EVERY DAY   famotidine (PEPCID) 20 MG tablet One after supper   fludrocortisone (FLORINEF) 0.1 MG tablet Take 1 tablet (0.1 mg total) by mouth daily.   furosemide (LASIX) 20 MG tablet 40 mg once a week - 20 mg the other 6 days   insulin glargine (LANTUS) 100 UNIT/ML injection Inject 16 units under the skin once daily in the  morning. (Patient taking differently: Inject 18 units under the skin once daily in the morning.)   insulin lispro (HUMALOG) 100 UNIT/ML injection Inject 4-6 units under the skin three times daily before meals. (Patient taking differently: Inject 4-8 units under the skin three times daily before meals.)   Insulin Pen Needle 32G X 4 MM MISC Use as instructed to inject insulin 4 times daily.   loratadine (CLARITIN) 10 MG tablet Take 10 mg by mouth daily.   metoprolol tartrate (LOPRESSOR) 25 MG tablet Take 1 tablet (25 mg total) by mouth 2 (two) times daily.   potassium chloride SA (K-DUR) 20 MEQ tablet Take 1 tablet (20 mEq total) by mouth daily.   rosuvastatin (CRESTOR) 40 MG tablet Take 1 tablet (40 mg total) by mouth at bedtime.   zolpidem (AMBIEN) 5 MG tablet TAKE ONE TABLET BY MOUTH AT BEDTIME AS NEEDED FOR SLEEP   [DISCONTINUED] pantoprazole (PROTONIX) 40 MG tablet Take 1 tablet (40 mg total) by mouth daily.      Allergies:   Amoxicillin, Aldactone [spironolactone], Imdur [isosorbide dinitrate], Tape, Arimidex [anastrozole], Latex, and Tetracycline   Social History   Socioeconomic History   Marital status: Married    Spouse name: Not on file   Number of children: 2   Years of education: Not on file   Highest education level: Not on file  Occupational History   Occupation: retired  Scientist, product/process development strain: Not on file   Food insecurity    Worry: Not on file    Inability: Not on Lexicographer needs    Medical: Not on file    Non-medical: Not on file  Tobacco Use    Smoking status: Never Smoker   Smokeless tobacco: Never Used  Substance and Sexual Activity   Alcohol use: No   Drug use: No   Sexual activity: Yes    Birth control/protection: None  Lifestyle   Physical activity    Days per week: Not on file    Minutes per session: Not on file   Stress: Not on file  Relationships   Social connections    Talks on phone: Not on file    Gets together: Not on file    Attends religious service: Not on file    Active member of club or organization: Not on file    Attends meetings of clubs or organizations: Not on file    Relationship status: Not on file  Other Topics Concern   Not on file  Social History Narrative   Not on file     Family History:  The patient's family history includes Diabetes in her maternal grandmother and son; Healthy in her brother; Heart attack in her brother; Heart disease in her brother, father, and maternal grandmother; Heart failure in her father. There is no history of Colon cancer or Esophageal cancer.  ROS:   Please see the history of present illness. All other systems are reviewed and otherwise negative.    EKGs/Labs/Other Studies Reviewed:    Studies reviewed were summarized above.   EKG:  EKG is ordered today, personally reviewed, demonstrating NSR 62bpm, first degree AVB, cannot r/o prior anteiror infarct - poor R wave progression, low voltage in V3-V6, nonspecific STT changes, possible U wave in V2 - appears stable to 12/2018  Recent Labs: 05/31/2018: Magnesium 1.9 10/28/2018: Pro B Natriuretic peptide (BNP) 364.0 01/19/2019: ALT 24; TSH 2.900 02/05/2019: BUN 22; Creatinine, Ser 1.98; Potassium 4.1; Sodium 140 02/13/2019: Hemoglobin 8.3;  Platelet Count 102  Recent Lipid Panel    Component Value Date/Time   CHOL 106 12/02/2018 1041   TRIG 97 12/02/2018 1041   HDL 34 (L) 12/02/2018 1041   CHOLHDL 3.1 12/02/2018 1041   CHOLHDL 3 05/12/2018 1038   VLDL 16.4 05/12/2018 1038   LDLCALC 53 12/02/2018  1041   LDLDIRECT 53.0 10/08/2016 0952    PHYSICAL EXAM:    VS:  BP (!) 118/46    Pulse 62    Ht 5' 5" (1.651 m)    Wt 165 lb 6.4 oz (75 kg)    BMI 27.52 kg/m   BMI: Body mass index is 27.52 kg/m. Unable to get pulse ox due to acrylic nails.  GEN: Well nourished, well developed elderly WF, in no acute distress. Appears older than stated age 48: normocephalic, atraumatic Neck: no JVD, carotid bruits, or masses Cardiac: RRR; soft SEM at RUSB, crisp valve click, no rubs or gallops, 1+ BLE edema with support hose in place Respiratory: diminished BS R base about 1/3 of the way up. L lung clear. No wheezes, rales or rhonchi. Normal work of breathing GI: soft, nontender, nondistended, + BS MS: no deformity or atrophy Skin: warm and dry, no rash Neuro:  Alert and Oriented x 3, Strength and sensation are intact, follows commands Psych: euthymic mood, full affect  Wt Readings from Last 3 Encounters:  02/19/19 165 lb 6.4 oz (75 kg)  02/13/19 166 lb (75.3 kg)  02/06/19 169 lb 3.2 oz (76.7 kg)     ASSESSMENT & PLAN:   1. Multifactorial dyspnea with probable recurrent pleural effusion of uncertain etiology and possible acute on chronic diastolic CHF with recurrent pleural effusion - very complex situation, made more challenging by severe anemia, CKD, orthostasis, and history of syncope. Yesterday we called her and offered that she may consider going to the ER based on her worsening symptoms but she declined. She had taken 45m of Lasix and was feeling a little bit better. She does admit today though that symptoms are ongoing. She feels fine at rest but still has significant DOE and orthopnea. She declines to go to the ER today. We dicussed her of the tenuous nature of her condition and that we can try outpatient adjustment but would have low threshold to go in the next few days if no improvement. I did some digging to make sure she was not requiring the florinef specifically for other reasons, and  that does not seem to be the case. She was reported to have ruled out for adrenal insufficiency. She is on very low dose. With recent fluid accumulation issues (pleural effusion, ascites on abd [place] and edema), I think we should try and transition off the Florinef back onto midodrine, starting at low dose given her CKD. Will stop Florinef and start midodrine 2.522mTID. In the interim I've asked her to increase her Lasix to 4060mlternating with 33m85mery other day - she plans to pick up her midodrine after this visit. She took 33mg62mLasix this AM. After she takes her midodrine this afternoon, she will go ahead and take another 33mg 24masix to equal her "40mg" 29mtoday. Will also check labs and CXR. She definitely has a right pleural effusion on exam. She certainly has many reasons to be SOB. I discussed plan with Dr. McAlhanAngelena Form(DOD) who is in agreement. We also advised low threshold to go to the hospital if symptoms persist. If her pleural effusion does not respond  to medicine changes, will need to consider repeat thoracentesis. Addendum: per lab results with rising Cr and worsening pleural effusion, patient was recommended to go to ER for internal medicine admission. I spoke with Truitt Merle who knows patient well It does concern [place] greatly that she has had recurrence of her right pleural effusion along with weight loss with prior known malignancy. We are concerned this could represent something more ominous and would benefit from pulmonary input. 2. CKD stage IV - recheck level today  - followed by nephrology, was relatively stable in 01/2019. 3. CAD s/p CABG and AVR - echo was fairly stable in 10/2018 with normal LV function, no recent anginal type chest pain. Follow conservatively at this time in light of progressive recent anemia. 4. Chronic anemia - followed by heme/onc, will f/u value today with labs to ensure no recurrent acute drop. 5. Paroxysmal atrial fib/PAT - last ILR interrogation was  normal. She is in NSR today. She is on Eliquis at appropriate dose.  Disposition: F/u with Dr. Norton Pastel or APP early next week.   Medication Adjustments/Labs and Tests Ordered: Current medicines are reviewed at length with the patient today.  Concerns regarding medicines are outlined above. Medication changes, Labs and Tests ordered today are summarized above and listed in the Patient Instructions accessible in Encounters.   Signed, Charlie Pitter, PA-C  02/19/2019 10:55 AM    [address], [place], [place]  [id number] Phone: [phone]; Fax: [phone]

## 2019-02-18 NOTE — Progress Notes (Signed)
[facility] Summary Report / Loop Recorder 

## 2019-02-18 NOTE — Telephone Encounter (Signed)
Call placed to pt re: message below. Per pt, she feels better today and slept better last night. Pt states that today is the day that she takes 2 of her Lasix. Pt declined ER at this time. Will keep her appt tomorrow, 02/19/2019, and if pt gets worse she will go to ED and let us know.

## 2019-02-18 NOTE — Telephone Encounter (Signed)
   Chart reviewed in prep for visit tomorrow. Extremely complex patient, well outlined by [name] in the past with numerous complications. H/o CAD s/p CABG (1998 with redo 2009), bioprosthetic AVR in 2707, chronic diastolic CHF, asthma, carotid artery disease (1-39% by duplex 10/2017, due 2021), CKD stage IV, PAF, atrial tachycardia, breast CA, orthostasis, anemia, thrombocytopenia, mild dilation of ascending aorta by echo 10/2018, recurrent syncope, pleural effusion requiring thoracentesis, anemia, HTN, GERD, HLD, mania, psoriasis, stroke per PMH, seizure like activity.   Reviewed her complaints in Mychart message. This does not appear to be a simple case of recurrent fluid where we can easily diurese, given her advanced kidney disease and history of orthostasis. I worry she needs admission. If her SOB has progressed to the point of orthopnea and now SOB during the day at rest, I would actually suggest she go to the ER for evaluation as I suspect she would require admission - would be able to facilitate thoracentesis faster if needed, and also safer option to concomitantly monitor her BP response and kidney function as well. Anemia is likely also contributing and may need to be acutely addressed.  Dayna Dunn PA-C

## 2019-02-19 ENCOUNTER — Inpatient Hospital Stay (HOSPITAL_COMMUNITY)
Admission: EM | Admit: 2019-02-19 | Discharge: 2019-02-21 | DRG: 291 | Disposition: A | Discharge status: Home / Self Care | Payer: Medicare Other | Attending: Internal Medicine | Admitting: Internal Medicine

## 2019-02-19 ENCOUNTER — Ambulatory Visit (INDEPENDENT_AMBULATORY_CARE_PROVIDER_SITE_OTHER): Payer: Medicare Other | Admitting: Physician Assistant

## 2019-02-19 ENCOUNTER — Encounter (HOSPITAL_COMMUNITY): Payer: Self-pay | Admitting: Emergency Medicine

## 2019-02-19 ENCOUNTER — Telehealth: Payer: Self-pay | Admitting: *Deleted

## 2019-02-19 ENCOUNTER — Encounter: Payer: Self-pay | Admitting: Physician Assistant

## 2019-02-19 ENCOUNTER — Ambulatory Visit
Admission: RE | Admit: 2019-02-19 | Discharge: 2019-02-19 | Disposition: A | Discharge status: Home / Self Care | Payer: Medicare Other | Source: Ambulatory Visit | Attending: Physician Assistant | Admitting: Physician Assistant

## 2019-02-19 ENCOUNTER — Emergency Department (HOSPITAL_COMMUNITY): Payer: Medicare Other

## 2019-02-19 ENCOUNTER — Other Ambulatory Visit: Payer: Self-pay

## 2019-02-19 VITALS — BP 118/46 | HR 62 | Ht 65.0 in | Wt 165.4 lb

## 2019-02-19 DIAGNOSIS — Z91048 Other nonmedicinal substance allergy status: Secondary | ICD-10-CM

## 2019-02-19 DIAGNOSIS — D649 Anemia, unspecified: Secondary | ICD-10-CM | POA: Diagnosis not present

## 2019-02-19 DIAGNOSIS — I482 Chronic atrial fibrillation, unspecified: Secondary | ICD-10-CM | POA: Diagnosis present

## 2019-02-19 DIAGNOSIS — I6523 Occlusion and stenosis of bilateral carotid arteries: Secondary | ICD-10-CM | POA: Diagnosis present

## 2019-02-19 DIAGNOSIS — E119 Type 2 diabetes mellitus without complications: Secondary | ICD-10-CM

## 2019-02-19 DIAGNOSIS — R0600 Dyspnea, unspecified: Secondary | ICD-10-CM

## 2019-02-19 DIAGNOSIS — Z888 Allergy status to other drugs, medicaments and biological substances status: Secondary | ICD-10-CM

## 2019-02-19 DIAGNOSIS — C50211 Malignant neoplasm of upper-inner quadrant of right female breast: Secondary | ICD-10-CM | POA: Diagnosis present

## 2019-02-19 DIAGNOSIS — D631 Anemia in chronic kidney disease: Secondary | ICD-10-CM | POA: Diagnosis present

## 2019-02-19 DIAGNOSIS — L409 Psoriasis, unspecified: Secondary | ICD-10-CM | POA: Diagnosis present

## 2019-02-19 DIAGNOSIS — Z91040 Latex allergy status: Secondary | ICD-10-CM

## 2019-02-19 DIAGNOSIS — I4891 Unspecified atrial fibrillation: Secondary | ICD-10-CM | POA: Diagnosis not present

## 2019-02-19 DIAGNOSIS — I428 Other cardiomyopathies: Secondary | ICD-10-CM | POA: Diagnosis present

## 2019-02-19 DIAGNOSIS — Z7901 Long term (current) use of anticoagulants: Secondary | ICD-10-CM | POA: Diagnosis not present

## 2019-02-19 DIAGNOSIS — I252 Old myocardial infarction: Secondary | ICD-10-CM

## 2019-02-19 DIAGNOSIS — Z09 Encounter for follow-up examination after completed treatment for conditions other than malignant neoplasm: Secondary | ICD-10-CM

## 2019-02-19 DIAGNOSIS — I5033 Acute on chronic diastolic (congestive) heart failure: Secondary | ICD-10-CM | POA: Diagnosis not present

## 2019-02-19 DIAGNOSIS — N184 Chronic kidney disease, stage 4 (severe): Secondary | ICD-10-CM | POA: Diagnosis present

## 2019-02-19 DIAGNOSIS — E785 Hyperlipidemia, unspecified: Secondary | ICD-10-CM | POA: Diagnosis present

## 2019-02-19 DIAGNOSIS — J189 Pneumonia, unspecified organism: Secondary | ICD-10-CM | POA: Diagnosis present

## 2019-02-19 DIAGNOSIS — E1122 Type 2 diabetes mellitus with diabetic chronic kidney disease: Secondary | ICD-10-CM | POA: Diagnosis present

## 2019-02-19 DIAGNOSIS — I2510 Atherosclerotic heart disease of native coronary artery without angina pectoris: Secondary | ICD-10-CM

## 2019-02-19 DIAGNOSIS — I454 Nonspecific intraventricular block: Secondary | ICD-10-CM | POA: Diagnosis present

## 2019-02-19 DIAGNOSIS — H409 Unspecified glaucoma: Secondary | ICD-10-CM | POA: Diagnosis present

## 2019-02-19 DIAGNOSIS — K219 Gastro-esophageal reflux disease without esophagitis: Secondary | ICD-10-CM | POA: Diagnosis present

## 2019-02-19 DIAGNOSIS — Z951 Presence of aortocoronary bypass graft: Secondary | ICD-10-CM | POA: Diagnosis not present

## 2019-02-19 DIAGNOSIS — T502X5A Adverse effect of carbonic-anhydrase inhibitors, benzothiadiazides and other diuretics, initial encounter: Secondary | ICD-10-CM | POA: Diagnosis present

## 2019-02-19 DIAGNOSIS — E876 Hypokalemia: Secondary | ICD-10-CM

## 2019-02-19 DIAGNOSIS — I509 Heart failure, unspecified: Secondary | ICD-10-CM

## 2019-02-19 DIAGNOSIS — I480 Paroxysmal atrial fibrillation: Secondary | ICD-10-CM

## 2019-02-19 DIAGNOSIS — Z833 Family history of diabetes mellitus: Secondary | ICD-10-CM

## 2019-02-19 DIAGNOSIS — N189 Chronic kidney disease, unspecified: Secondary | ICD-10-CM | POA: Diagnosis not present

## 2019-02-19 DIAGNOSIS — Z880 Allergy status to penicillin: Secondary | ICD-10-CM

## 2019-02-19 DIAGNOSIS — R0602 Shortness of breath: Secondary | ICD-10-CM | POA: Diagnosis present

## 2019-02-19 DIAGNOSIS — I5043 Acute on chronic combined systolic (congestive) and diastolic (congestive) heart failure: Secondary | ICD-10-CM | POA: Diagnosis present

## 2019-02-19 DIAGNOSIS — Z79899 Other long term (current) drug therapy: Secondary | ICD-10-CM

## 2019-02-19 DIAGNOSIS — N179 Acute kidney failure, unspecified: Secondary | ICD-10-CM | POA: Diagnosis present

## 2019-02-19 DIAGNOSIS — I4581 Long QT syndrome: Secondary | ICD-10-CM | POA: Diagnosis present

## 2019-02-19 DIAGNOSIS — I471 Supraventricular tachycardia: Secondary | ICD-10-CM

## 2019-02-19 DIAGNOSIS — I69398 Other sequelae of cerebral infarction: Secondary | ICD-10-CM

## 2019-02-19 DIAGNOSIS — J309 Allergic rhinitis, unspecified: Secondary | ICD-10-CM | POA: Diagnosis present

## 2019-02-19 DIAGNOSIS — Z952 Presence of prosthetic heart valve: Secondary | ICD-10-CM | POA: Diagnosis not present

## 2019-02-19 DIAGNOSIS — H903 Sensorineural hearing loss, bilateral: Secondary | ICD-10-CM | POA: Diagnosis present

## 2019-02-19 DIAGNOSIS — J4540 Moderate persistent asthma, uncomplicated: Secondary | ICD-10-CM | POA: Diagnosis present

## 2019-02-19 DIAGNOSIS — R9431 Abnormal electrocardiogram [ECG] [EKG]: Secondary | ICD-10-CM

## 2019-02-19 DIAGNOSIS — Z20828 Contact with and (suspected) exposure to other viral communicable diseases: Secondary | ICD-10-CM | POA: Diagnosis present

## 2019-02-19 DIAGNOSIS — Z923 Personal history of irradiation: Secondary | ICD-10-CM

## 2019-02-19 DIAGNOSIS — Z7951 Long term (current) use of inhaled steroids: Secondary | ICD-10-CM

## 2019-02-19 DIAGNOSIS — Z794 Long term (current) use of insulin: Secondary | ICD-10-CM

## 2019-02-19 DIAGNOSIS — I110 Hypertensive heart disease with heart failure: Principal | ICD-10-CM | POA: Diagnosis present

## 2019-02-19 DIAGNOSIS — Z95818 Presence of other cardiac implants and grafts: Secondary | ICD-10-CM

## 2019-02-19 DIAGNOSIS — J90 Pleural effusion, not elsewhere classified: Secondary | ICD-10-CM

## 2019-02-19 DIAGNOSIS — Z8249 Family history of ischemic heart disease and other diseases of the circulatory system: Secondary | ICD-10-CM

## 2019-02-19 DIAGNOSIS — M8589 Other specified disorders of bone density and structure, multiple sites: Secondary | ICD-10-CM | POA: Diagnosis present

## 2019-02-19 DIAGNOSIS — Z9221 Personal history of antineoplastic chemotherapy: Secondary | ICD-10-CM

## 2019-02-19 DIAGNOSIS — Z79811 Long term (current) use of aromatase inhibitors: Secondary | ICD-10-CM

## 2019-02-19 DIAGNOSIS — N183 Chronic kidney disease, stage 3 (moderate): Secondary | ICD-10-CM | POA: Diagnosis not present

## 2019-02-19 DIAGNOSIS — Z881 Allergy status to other antibiotic agents status: Secondary | ICD-10-CM

## 2019-02-19 LAB — COMPREHENSIVE METABOLIC PANEL
ALT: 17 IU/L (ref 0–32)
AST: 31 IU/L (ref 0–40)
Albumin/Globulin Ratio: 1.1 — ABNORMAL LOW (ref 1.2–2.2)
Albumin: 2.9 g/dL — ABNORMAL LOW (ref 3.7–4.7)
Alkaline Phosphatase: 65 IU/L (ref 39–117)
BUN/Creatinine Ratio: 10 — ABNORMAL LOW (ref 12–28)
BUN: 26 mg/dL (ref 8–27)
Bilirubin Total: 1 mg/dL (ref 0.0–1.2)
CO2: 30 mmol/L — ABNORMAL HIGH (ref 20–29)
Calcium: 8.4 mg/dL — ABNORMAL LOW (ref 8.7–10.3)
Chloride: 104 mmol/L (ref 96–106)
Creatinine, Ser: 2.55 mg/dL — ABNORMAL HIGH (ref 0.57–1.00)
GFR calc Af Amer: 21 mL/min/{1.73_m2} — ABNORMAL LOW (ref >59)
GFR calc non Af Amer: 18 mL/min/{1.73_m2} — ABNORMAL LOW (ref >59)
Globulin, Total: 2.7 g/dL (ref 1.5–4.5)
Glucose: 233 mg/dL — ABNORMAL HIGH (ref 65–99)
Potassium: 3.3 mmol/L — ABNORMAL LOW (ref 3.5–5.2)
Sodium: 139 mmol/L (ref 134–144)
Total Protein: 5.6 g/dL — ABNORMAL LOW (ref 6.0–8.5)

## 2019-02-19 LAB — CBC
Hematocrit: 27.8 % — ABNORMAL LOW (ref 34.0–46.6)
Hemoglobin: 8.6 g/dL — ABNORMAL LOW (ref 11.1–15.9)
MCH: 29.2 pg (ref 26.6–33.0)
MCHC: 30.9 g/dL — ABNORMAL LOW (ref 31.5–35.7)
MCV: 94 fL (ref 79–97)
Platelets: 113 10*3/uL — ABNORMAL LOW (ref 150–450)
RBC: 2.95 x10E6/uL — ABNORMAL LOW (ref 3.77–5.28)
RDW: 14.9 % (ref 11.7–15.4)
WBC: 7.3 10*3/uL (ref 3.4–10.8)

## 2019-02-19 LAB — PRO B NATRIURETIC PEPTIDE: NT-Pro BNP: 2996 pg/mL — ABNORMAL HIGH (ref 0–301)

## 2019-02-19 MED ORDER — MIDODRINE HCL 2.5 MG PO TABS: 2.5000 mg | ORAL_TABLET | Freq: Three times a day (TID) | ORAL | 1 refills | Status: DC

## 2019-02-19 MED ORDER — POTASSIUM CHLORIDE CRYS ER 20 MEQ PO TBCR
20.0000 meq | EXTENDED_RELEASE_TABLET | Freq: Once | ORAL | Status: AC
  Administered 2019-02-19: 20 meq via ORAL
  Filled 2019-02-19: qty 1

## 2019-02-19 NOTE — ED Notes (Signed)
Labs and xray already done by cardiology see chart

## 2019-02-19 NOTE — Patient Instructions (Addendum)
Medication Instructions:  Your physician has recommended you make the following change in your medication:  1.  STOP the Florinef 2.  START Midodrine 2.5 mg taking 1 tablet three times a day 3.  INCREASE the Lasix alternating the 40 mg and 20 mg every other day thru the weekend. Once you take the first dose of midodrine today, you can take an additional 20mg  of Lasix this afternoon - today will be your "40mg " day.  If you need a refill on your cardiac medications before your next appointment, please call your pharmacy.   Lab work: TODAY:  STAT CMET, CBC, & PRO BNP  If you have labs (blood work) drawn today and your tests are completely normal, you will receive your results only by: Marland Kitchen MyChart Message (if you have MyChart) OR . A paper copy in the mail If you have any lab test that is abnormal or we need to change your treatment, we will call you to review the results.  Testing/Procedures: A chest x-ray takes a picture of the organs and structures inside the chest, including the heart, lungs, and blood vessels. This test can show several things, including, whether the heart is enlarges; whether fluid is building up in the lungs; and whether pacemaker / defibrillator leads are still in place. Go as soon as you leave the office. [place], [place]   Follow-Up: At [facility], you and your health needs are our priority.  As part of our continuing mission to provide you with exceptional heart care, we have created designated Provider Care Teams.  These Care Teams include your primary Cardiologist (physician) and Advanced Practice Providers (APPs -  Physician Assistants and Nurse Practitioners) who all work together to provide you with the care you need, when you need it. . COME SEE Truitt Merle, NP Monday 02/23/2019 ARRIVE AT 11:15 FOR REGISTRATION   Any Other Special Instructions Will Be Listed Below (If Applicable).  Please call  our office if you notice your blood pressure running less than 100 or over 150 on this new regimen. Seek medical care for any worsening symptoms.

## 2019-02-19 NOTE — ED Triage Notes (Signed)
Pt sent by cardiology for CHF exacerbation, abnormal K and kidney function labs. Pt having chest pressure on R side, hx of same with exacerbation.

## 2019-02-19 NOTE — Telephone Encounter (Signed)
-----   Message from Charlie Pitter, [place] sent at 02/19/2019  2:09 PM EDT ----- Please call patient. Her CXR does suggest her fluid in her lung has accumulated more than last month. Unfortunately her kidney function has risen quite a bit from prior labs as well and potassium is low, making it challenging to safely diurese her as we had planned. Albumin is low (protein in her blood) likely contributing. Blood count is stable but her worsening kidney failure indicates she truly need to be admitted for further management, probably needs a thoracentesis sooner rather than later.  Given her complex medical history I had reviewed with Dr. Angelena Form who agrees she should proceed to ER. (She would not be suitable for a direct admit on the cardiology service given her multiple issues because she would need internal medicine on board to manage the whole overall picture.)  Dayna Dunn PA-C

## 2019-02-19 NOTE — H&P (Signed)
History and Physical    KAYSE PUCCINI CBU:[id number] DOB: May 08, 1946 DOA: 02/19/2019  PCP: Mackie Pai, PA-C Patient coming from: Home  Chief Complaint: Shortness of breath, abnormal labs  HPI: Hannah [name] is a 73 y.o. female with medical history significant of chronic diastolic CHF, CAD status post CABG, AVR, asthma, chronic anemia followed by hematology, CKD stage IV, hypertension, hyperlipidemia, GERD, stroke, type 2 diabetes, paroxysmal atrial fibrillation on Eliquis presenting to the hospital for evaluation of shortness of breath and abnormal labs.  Patient was seen at cardiology office today for dyspnea and diagnosed with acute on chronic diastolic congestive heart failure with recurrent pleural effusion.  Sent to the hospital for further management.  Patient reports 1 week history of worsening dyspnea, orthopnea, and bilateral lower extremity edema.  Denies chest pain, cough, fevers, or chills.  States she was previously told she had fluid in her right lung which was drained back in April.  She was seen by pulmonologist at that time and told there was no infection in the fluid.  States 2 weeks ago cardiology changed the dose of her home Lasix 40 mg once a week and 20 mg rest of the days of the week.  She then was seen again by cardiology yesterday and the dose of Lasix was changed to 40 mg every other day.  She took Lasix 20 mg at home yesterday.  She was then called by cardiology office and told to come into the hospital because her kidney labs were abnormal.  ED Course: Vitals stable on arrival.  Not hypoxic.  No leukocytosis.  Hemoglobin 8.6, at baseline.  Platelet count 113,000, chronically low.  Creatinine 2.5, baseline 1.7-1.9.  Potassium 3.3.  Bicarb 30.  BNP 2996.  Magnesium pending.  SARS-CoV-2 test pending.  Chest x-ray showing increasing right-sided pleural effusion with underlying atelectasis. Patient received potassium 20 mEq in the ED.  Review of Systems:  All systems  reviewed and apart from history of presenting illness, are negative.  Past Medical History:  Diagnosis Date  . Abnormally small mouth   . Allergic rhinitis 10/14/2009   Qualifier: Diagnosis of  By: Lamonte Sakai MD, Rose Fillers   Overview:  Overview:  Qualifier: Diagnosis of  By: Lamonte Sakai MD, Rose Fillers  Last Assessment & Plan:  Please continue Xyzal and Nasacort as you have been using them  . Anemia   . Asthma 05/12/2009   10/12/2014 p extensive coaching HFA effectiveness =    75% s spacer    Overview:  Overview:  10/12/2014 p extensive coaching HFA effectiveness =    75% s spacer   Last Assessment & Plan:  Please continue Symbicort 2 puffs twice a day. Remember to rinse and gargle after taking this medication. Take albuterol 2 puffs up to every 4 hours if needed for shortness of breath.  Follow with Dr Lamonte Sakai in 6 month  . Bilateral carotid artery stenosis    a. mild 1-39% by duplex 10/2017, due 2021.  . Breast cancer ([place]) 01/18/2016   right breast  . CAD (coronary artery disease) 01/24/2011   a. s/p CABG 1998 with redo 2009.  Marland Kitchen Chemotherapy induced nausea and vomiting 02/24/2016  . Chemotherapy-induced peripheral neuropathy ([place]) 04/27/2016  . 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 Chronic diastolic CHF (congestive heart failure) ([place]) 09/24/2013  . CKD (chronic kidney disease), stage IV ([place]) 09/24/2013  . Closed fracture of head of left humerus 09/09/2017  . Complication of anesthesia   . Essential hypertension 05/12/2009   Qualifier:  Diagnosis of  By: Lamonte Sakai MD, Rose Fillers   . Gallstones   . GERD (gastroesophageal reflux disease)   . Glaucoma   . H/O atrial tachycardia 05/12/2009   Qualifier: History of  By: Lamonte Sakai MD, Rose Fillers   Overview:  Overview:  Qualifier: History of  By: Lamonte Sakai MD, Rose Fillers  Last Assessment & Plan:  There was no evidence of this on her cardiac monitor. I would recommend watchful waiting.   Marland Kitchen History of non-ST elevation myocardial infarction (NSTEMI)    Sept 2014--  thought to be  type II HTN w/ LHC without infarct related artery and patent grafts  . History of radiation therapy 05/24/16-07/26/16   right breast 50.4 Gy in 28 fractions, right breast boost 10 Gy in 5 fractions  . Hyperlipidemia   . Hypertension    Resolved.  . Iron deficiency anemia   . Mania ([place]) 03/05/2016  . Moderate persistent asthma    pulmologist-  Dr. Malvin Johns  . Osteomyelitis of toe of left foot ([place]) 04/06/2016  . Osteopenia of multiple sites 10/19/2015  . PAF (paroxysmal atrial fibrillation) ([place])   . Personal history of chemotherapy 2017  . Personal history of radiation therapy 2017  . PONV (postoperative nausea and vomiting)   . Port catheter in place 02/17/2016  . Psoriasis    right leg  . Renal calculus, right   . S/P AVR    prosthesis valve placement 2009 at same time re-do CABG  . Seizure-like activity ([place]) 09/01/2017  . Sensorineural hearing loss (SNHL) of both ears 01/06/2016  . Stroke [facility])    residual rt hearing loss  . Syncope 09/06/2017  . Type 2 diabetes mellitus ([place])    monitored by dr Dwyane Dee    Past Surgical History:  Procedure Laterality Date  . AORTIC VALVE REPLACEMENT  2009   #[facility]m [place] [place] LPEase pericardial valve (done same time is CABG)  . BREAST LUMPECTOMY Right 01/18/2016  . BREAST LUMPECTOMY WITH RADIOACTIVE SEED AND SENTINEL LYMPH NODE BIOPSY Right 01/18/2016   Procedure: RIGHT BREAST LUMPECTOMY WITH RADIOACTIVE SEED AND SENTINEL LYMPH NODE BIOPSY;  Surgeon: DAlphonsa Overall MD;  [place]  Service: General;  Laterality: Right;  . CARDIAC CATHETERIZATION  03/23/2008   Pre-redo CABG: L main OK, LAD (T), CFX (T), OM1 99%, RCA (T), LIMA-LAD OK, SVG-OM(?3) OK w/ little florw to OM2, SVG-RCA OK. EF NL  . CARPAL TUNNEL RELEASE    . CHOLECYSTECTOMY N/A 07/07/2018   Procedure: LAPAROSCOPIC CHOLECYSTECTOMY WITH INTRAOPERATIVE CHOLANGIOGRAM ERAS PATHWAY;  Surgeon: NAlphonsa Overall MD;  Location: [facility];  Service: General;  Laterality: N/A;  . COLONOSCOPY     around  2015. Possibly with [facility]   . CORONARY ARTERY BYPASS GRAFT  1998 &  re-do 2009   Had LIMA to DX/LAD, SV[facility] SVG to 3rd OM and PD at time of redo  . CYSTOSCOPY W/ URETERAL STENT PLACEMENT Right 12/20/2014   Procedure: CYSTOSCOPY WITH RETROGRADE PYELOGRAM/URETERAL STENT PLACEMENT;  Surgeon: PCleon Gustin MD;  [facility]  Service: Urology;  Laterality: Right;  . ESOPHAGOGASTRODUODENOSCOPY     many years ago per patient   . ESOPHAGOGASTRODUODENOSCOPY ENDOSCOPY  06/17/2018  . EYE SURGERY Bilateral    cataracts  . HOLMIUM LASER APPLICATION Right [id number]/9381  Procedure:  HOLMIUM LASER LITHOTRIPSY;  Surgeon: PCleon Gustin MD;  [facility]  Service: Urology;  Laterality: Right;  . IR THORACENTESIS ASP  PLEURAL SPACE W/IMG GUIDE  10/03/2018  . LEFT HEART CATHETERIZATION WITH CORONARY/GRAFT ANGIOGRAM N/A 02/23/2013   Procedure: LEFT HEART CATHETERIZATION WITH Beatrix Fetters;  Surgeon: Blane Ohara, MD;  Location[facility] C[facility] LAB;  Service: Cardiovascular;  Laterality: N/A;  . LOOP RECORDER INSERTION N/A 08/30/2017   Procedure: LOOP RECORDER INSERTION;  Surgeon: Evans Lance, MD;  Location: [place] CV LAB;  Service: Cardiovascular;  Laterality: N/A;  . PORTACATH PLACEMENT Left 01/18/2016   Procedure: INSERTION PORT-A-CATH;  Surgeon: Alphonsa Overall, MD;  Location: [place];  Service: General;  Laterality: Left;  . portacath removal    . TONSILLECTOMY    . TRANSTHORACIC ECHOCARDIOGRAM  02-24-2013      mild LVH,  ef 50-55%/  AV bioprosthesis was present with very mild stenosis and no regurg., mean grandient 86mHg, peak grandient 22mg /  mild MR/  mild LAE and RAE/  moderate TR  . TUBAL LIGATION       reports that she has never smoked. She has never used smokeless tobacco. She reports that she does not drink alcohol or use drugs.  Allergies  Allergen Reactions  . Amoxicillin Rash and  Other (See Comments)    Tolerates Cephalosporins Has patient had a PCN reaction causing immediate rash, facial/tongue/throat swelling, SOB or lightheadedness with hypotension: Yes Has patient had a PCN reaction causing severe rash involving mucus membranes or skin necrosis: Yes Has patient had a PCN reaction that required hospitalization No Has patient had a PCN reaction occurring within the last 10 years: No If all of the above answers are "NO", then may proceed with Cephalosporin use.   . Tape Other (See Comments)    Must use paper tape  . Aldactone [Spironolactone] Other (See Comments)    CKD/hypokalemia  . Imdur [Isosorbide Dinitrate] Other (See Comments)    Headache/severe hypotension/Syncope  . Arimidex [Anastrozole] Nausea Only  . Latex Itching and Other (See Comments)    (Dentist office)  . Tetracycline Rash    Family History  Problem Relation Age of Onset  . Heart disease Father   . Heart failure Father   . Diabetes Maternal Grandmother   . Heart disease Maternal Grandmother   . Diabetes Son   . Healthy Brother        #1  . Heart attack Brother        #2  . Heart disease Brother        #2  . Colon cancer Neg Hx   . Esophageal cancer Neg Hx     Prior to Admission medications   Medication Sig Start Date End Date Taking? Authorizing Provider  acetaminophen (TYLENOL) 325 MG tablet Take 2 tablets (650 mg total) by mouth every 6 (six) hours as needed for mild pain (or Fever >/= 101). 09/03/17  Yes Vann, Jessica U, DO  albuterol (PROVENTIL HFA;VENTOLIN HFA) 108 (90 Base) MCG/ACT inhaler Inhale 2 puffs into the lungs every 6 (six) hours as needed for wheezing or shortness of breath.   Yes [provider]  amiodarone (PACERONE) 200 MG tablet Take 1 tablet (200 mg total) by mouth daily. 02/03/19  Yes TaEvans LanceMD  budesonide-formoterol (SYMBICORT) 160-4.5 MCG/ACT inhaler INHALE TWO PUFFS INTO THE LUNGS TWICE DAILY Patient taking differently: Inhale 2 puffs into  the lungs 2 (two) times daily.  09/18/18  Yes Byrum, RoRose FillersMD  ELIQUIS 5 MG TABS tablet TAKE ONE TABLET BY MOUTH TWICE DAILY Patient taking differently: Take 5 mg by mouth 2 (two) times  daily.  02/03/19  Yes Evans Lance, MD  epoetin alfa-epbx (RETACRIT) 34035 UNIT/ML injection Inject 40,000 Units into the skin every Friday.   Yes [provider]  exemestane (AROMASIN) 25 MG tablet TAKE ONE TABLET BY MOUTH DAILY AFTER BREAKFAST Patient taking differently: Take 25 mg by mouth daily after breakfast.  09/17/18  Yes Nicholas Lose, MD  ezetimibe (ZETIA) 10 MG tablet TAKE ONE TABLET BY MOUTH EVERY DAY 10/29/18  Yes Evans Lance, MD  famotidine (PEPCID) 20 MG tablet One after supper Patient taking differently: Take 20 mg by mouth daily after supper.  09/25/18  Yes Tanda Rockers, MD  insulin glargine (LANTUS) 100 UNIT/ML injection Inject 16 units under the skin once daily in the morning. Patient taking differently: Inject 18 Units into the skin daily after breakfast.  09/01/18  Yes Elayne Snare, MD  insulin lispro (HUMALOG) 100 UNIT/ML injection Inject 4-6 units under the skin three times daily before meals. Patient taking differently: Inject 4-8 Units into the skin 3 (three) times daily after meals.  09/04/18  Yes Elayne Snare, MD  loratadine (CLARITIN) 10 MG tablet Take 10 mg by mouth daily.   Yes [provider]  metoprolol tartrate (LOPRESSOR) 25 MG tablet Take 1 tablet (25 mg total) by mouth 2 (two) times daily. 12/24/18 03/24/19 Yes [name], NP  nitroGLYCERIN (NITROSTAT) 0.4 MG SL tablet Place 1 tablet (0.4 mg total) under the tongue every 5 (five) minutes as needed for chest pain. 09/19/18 02/19/19 Yes Evans Lance, MD  potassium chloride SA (K-DUR) 20 MEQ tablet Take 1 tablet (20 mEq total) by mouth daily. 01/19/19  Yes [name], NP  rosuvastatin (CRESTOR) 40 MG tablet Take 1 tablet (40 mg total) by mouth at bedtime. 09/08/18  Yes [name], NP  zolpidem  (AMBIEN) 5 MG tablet TAKE ONE TABLET BY MOUTH AT BEDTIME AS NEEDED FOR SLEEP Patient taking differently: Take 5 mg by mouth at bedtime.  12/06/18  Yes Saguier, Percell Miller, PA-C  ACCU-CHEK GUIDE test strip Use to test blood sugar 3 times daily Patient taking differently: 1 each by Other route 3 (three) times daily.  10/21/18   Elayne Snare, MD  Blood Glucose Monitoring Suppl (ACCU-CHEK AVIVA PLUS) w/Device KIT Use accu chek aviva meter to check blood sugar three times daily. 02/13/19   Elayne Snare, MD  furosemide (LASIX) 20 MG tablet 40 mg once a week - 20 mg the other 6 days Patient taking differently: Take 20-40 mg by mouth See admin instructions. Take 20 mg by mouth once a day on Sun/Mon/Tues/Thurs/Fri/Sat and 40 mg on Wed (dosage to eventually change to 20 mg once a day, rotating with 40 mg every other day) 01/28/19   [name], NP  Insulin Pen Needle 32G X 4 MM MISC Use as instructed to inject insulin 4 times daily. 09/02/18   Elayne Snare, MD  midodrine (PROAMATINE) 2.5 MG tablet Take 1 tablet (2.5 mg total) by mouth 3 (three) times daily with meals. Patient not taking: Reported on 02/19/2019 02/19/19   Charlie Pitter, PA-C    Physical Exam: Vitals:   02/20/19 0000 02/20/19 0343 02/20/19 0400 02/20/19 0623  BP: (!) 141/53 (!) 135/59 (!) 136/46 (!) 147/54  Pulse: (!) 56 (!) 56 60 62  Resp: _0 Temp:      TempSrc:      SpO2: 97% 95% 100% 93%  Weight:      Height:  Physical Exam  Constitutional: She is oriented to person, place, and time. She appears well-developed and well-nourished. No distress.  HENT:  Head: Normocephalic.  Mouth/Throat: Oropharynx is clear and moist.  Eyes: Right eye exhibits no discharge. Left eye exhibits no discharge.  Neck: Neck supple. JVD present.  Cardiovascular: Normal rate, regular rhythm and intact distal pulses.  Pulmonary/Chest: Effort normal. No respiratory distress. She has no wheezes.  Reduced breath sounds and rales appreciated at the  right lung base  Abdominal: Soft. Bowel sounds are normal. She exhibits no distension. There is no abdominal tenderness. There is no guarding.  Musculoskeletal:        General: Edema present.     Comments: +2 pitting edema bilateral lower extremities  Neurological: She is alert and oriented to person, place, and time.  Skin: Skin is warm and dry. She is not diaphoretic.     Labs on Admission: I have personally reviewed following labs and imaging studies  CBC: Recent Labs  Lab 02/13/19 1111 02/19/19 1125 02/20/19 0440  WBC 4.5 7.3 5.8  NEUTROABS 3.3  --   --   HGB 8.3* 8.6* 8.7*  HCT 28.7* 27.8* 30.4*  MCV 104.4* 94 102.4*  PLT 102* 113* 829*   Basic Metabolic Panel: Recent Labs  Lab 02/19/19 1125  NA 139  K 3.3*  CL 104  CO2 30*  GLUCOSE 233*  BUN 26  CREATININE 2.55*  CALCIUM 8.4*   GFR: Estimated Creatinine Clearance: 19.9 mL/min (A) (by C-G formula based on SCr of 2.55 mg/dL (H)). Liver Function Tests: Recent Labs  Lab 02/19/19 1125  AST 31  ALT 17  ALKPHOS 65  BILITOT 1.0  PROT 5.6*  ALBUMIN 2.9*   No results for input(s): LIPASE, AMYLASE in the last 168 hours. No results for input(s): AMMONIA in the last 168 hours. Coagulation Profile: No results for input(s): INR, PROTIME in the last 168 hours. Cardiac Enzymes: No results for input(s): CKTOTAL, CKMB, CKMBINDEX, TROPONINI in the last 168 hours. BNP (last 3 results) Recent Labs    09/25/18 1113 10/28/18 1041 02/19/19 1126  PROBNP 406.0* 364.0* 2,996*   HbA1C: No results for input(s): HGBA1C in the last 72 hours. CBG: No results for input(s): GLUCAP in the last 168 hours. Lipid Profile: No results for input(s): CHOL, HDL, LDLCALC, TRIG, CHOLHDL, LDLDIRECT in the last 72 hours. Thyroid Function Tests: No results for input(s): TSH, T4TOTAL, FREET4, T3FREE, THYROIDAB in the last 72 hours. Anemia Panel: No results for input(s): VITAMINB12, FOLATE, FERRITIN, TIBC, IRON, RETICCTPCT in the last 72  hours. Urine analysis:    Component Value Date/Time   COLORURINE YELLOW 05/21/2018 1736   APPEARANCEUR CLOUDY (A) 05/21/2018 1736   LABSPEC 1.023 05/21/2018 1736   LABSPEC 1.020 02/24/2016 1119   PHURINE 7.0 05/21/2018 1736   GLUCOSEU 50 (A) 05/21/2018 1736   GLUCOSEU Negative 02/24/2016 1119   HGBUR MODERATE (A) 05/21/2018 1736   BILIRUBINUR NEGATIVE 05/21/2018 1736   BILIRUBINUR Negative 02/24/2016 1119   KETONESUR NEGATIVE 05/21/2018 1736   PROTEINUR 30 (A) 05/21/2018 1736   UROBILINOGEN 0.2 02/24/2016 1119   NITRITE NEGATIVE 05/21/2018 1736   LEUKOCYTESUR LARGE (A) 05/21/2018 1736   LEUKOCYTESUR Trace 02/24/2016 1119    Radiological Exams on Admission: Dg Chest 2 View  Result Date: 02/19/2019 CLINICAL DATA:  Shortness of breath for 2 weeks EXAM: CHEST - 2 VIEW COMPARISON:  01/27/2019 FINDINGS: Cardiac shadow is stable in appearance. Postsurgical changes are again noted. Loop recorder is again seen. Slight increase in  right-sided pleural effusion with underlying atelectasis is noted. IMPRESSION: Increasing right-sided pleural effusion with underlying atelectasis. Electronically Signed   By: Inez Catalina M.D.   On: 02/19/2019 12:24    EKG: Independently reviewed.  Junctional rhythm, heart rate 61.  QTc 543.  Assessment/Plan Principal Problem:   Acute exacerbation of CHF (congestive heart failure) (HCC) Active Problems:   Type 2 diabetes mellitus (HCC)   AKI (acute kidney injury) (HCC)   Atrial fibrillation (HCC)   Hypokalemia   Acute exacerbation of chronic diastolic congestive heart failure Not tachypneic or hypoxic.  BNP 2996.  Chest x-ray showing increased right-sided pleural effusion with underlying atelectasis.  Last echo done May 2020 with normal systolic function and diastolic dysfunction. -Cardiac monitoring -Give IV Lasix 40 mg once.  Creatinine up from baseline and renal function needs to be monitored closely.  Order additional dose based on morning labs.  -Continue home metoprolol -Monitor intake and output, daily weights, low-sodium diet with fluid restriction -May need thoracentesis if pleural effusion does not respond to IV diuresis.  AKI on CKD stage IV Creatinine 2.5, baseline 1.7-1.9.  Suspect cardiorenal due to acutely decompensated CHF. -IV Lasix 40 mg ordered for one-time dose.  Order additional dose based on morning labs. -Monitor renal function closely -Monitor urine output -Avoid nephrotoxic agents/contrast  Hypokalemia In the setting of home diuretic use.  Potassium 3.3.   -Replete potassium.  Check magnesium level.  Continue to monitor electrolytes.  Paroxysmal atrial fibrillation -Stable.  Continue home metoprolol and Eliquis.  Holding amiodarone given QT prolongation on EKG which is new compared to prior tracing from 12/24/2018.  Please discuss with cardiology in the morning.  Well-controlled insulin-dependent diabetes mellitus Last A1c 5.6 on 02/09/2019. -Continue home Lantus 18 units daily.  Sliding scale insulin sensitive and CBG checks.  QTC prolongation on EKG -Keep potassium above 4 magnesium above 2 -Cardiac monitoring -Avoid QT prolonging drugs if possible -Repeat EKG in a.m.  DVT prophylaxis: Eliquis Code Status: Patient wishes to be full code. Family Communication: No family available. Disposition Plan: Anticipate discharge after clinical improvement. Consults called: None  Admission status: It is my clinical opinion that admission to INPATIENT is reasonable and necessary in this 73 y.o. female presenting with pleural effusion secondary to acutely decompensated CHF.  Anticipate she will need IV diuresis for several days.  Given the aforementioned, the predictability of an adverse outcome is felt to be significant. I expect that the patient will require at least 2 midnights in the hospital to treat this condition.   The medical decision making on this patient was of high complexity and the patient is at  high risk for clinical deterioration, therefore this is a level 3 visit.  Shela Leff MD [facility] Pager [phone]  If 7PM-7AM, please contact night-coverage [email or web address] Password [facility]  02/20/2019, 7:41 AM

## 2019-02-19 NOTE — Telephone Encounter (Signed)
Notes recorded by Jeanann Lewandowsky, RMA on 02/19/2019 at 2:18 PM EDT  The patient has been notified of the her lab / cxr results and the recommendations of presenting to the ED.  Pt verbalized her concern re: going to hospital over the weekend, with staff changing and she feels nothing gets done.  Pt aware that we hear her concerns, but this is the safest way to care for her, given her kidney function has worsened and she has a pleural effusion.  Pt understands and states she will go as soon as she can

## 2019-02-19 NOTE — ED Provider Notes (Addendum)
[facility] EMERGENCY DEPARTMENT Provider Note   CSN: [id number] Arrival date & time: 02/19/19  1525     History   Chief Complaint Chief Complaint  Patient presents with  . Congestive Heart Failure    HPI Hannah [name] is a 73 y.o. female.     HPI  74 year old female with history of CAD, diastolic CHF, CKD comes in a chief complaint of worsening labs and shortness of breath.  Patient is also on blood thinners and had pleural effusion in April that needed to be drained.  According to the patient over the past week she has noticed worsening shortness of breath.  Now walking 1 room to another gets her short of breath.  She is also having worsening orthopnea and paroxysmal nocturnal dyspnea.  She went to her cardiologist today and was advised to come to the ER, but she decided to go home.  Subsequently she received a phone call from the clinic stating that her kidney function is gotten worse and she has worsening pleural effusion, they recommended that she come back to the hospital for admission.  Prior thoracentesis revealed negative cytology. Patient has been taking her medications as prescribed.  Past Medical History:  Diagnosis Date  . Abnormally small mouth   . Allergic rhinitis 10/14/2009   Qualifier: Diagnosis of  By: Lamonte Sakai MD, Rose Fillers   Overview:  Overview:  Qualifier: Diagnosis of  By: Lamonte Sakai MD, Rose Fillers  Last Assessment & Plan:  Please continue Xyzal and Nasacort as you have been using them  . Anemia   . Asthma 05/12/2009   10/12/2014 p extensive coaching HFA effectiveness =    75% s spacer    Overview:  Overview:  10/12/2014 p extensive coaching HFA effectiveness =    75% s spacer   Last Assessment & Plan:  Please continue Symbicort 2 puffs twice a day. Remember to rinse and gargle after taking this medication. Take albuterol 2 puffs up to every 4 hours if needed for shortness of breath.  Follow with Dr Lamonte Sakai in 6 month  . Bilateral carotid artery stenosis     a. mild 1-39% by duplex 10/2017, due 2021.  . Breast cancer ([place]) 01/18/2016   right breast  . CAD (coronary artery disease) 01/24/2011   a. s/p CABG 1998 with redo 2009.  Marland Kitchen Chemotherapy induced nausea and vomiting 02/24/2016  . Chemotherapy-induced peripheral neuropathy ([place]) 04/27/2016  . 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 Chronic diastolic CHF (congestive heart failure) ([place]) 09/24/2013  . CKD (chronic kidney disease), stage IV ([place]) 09/24/2013  . Closed fracture of head of left humerus 09/09/2017  . Complication of anesthesia   . Essential hypertension 05/12/2009   Qualifier: Diagnosis of  By: Lamonte Sakai MD, Rose Fillers   . Gallstones   . GERD (gastroesophageal reflux disease)   . Glaucoma   . H/O atrial tachycardia 05/12/2009   Qualifier: History of  By: Lamonte Sakai MD, Rose Fillers   Overview:  Overview:  Qualifier: History of  By: Lamonte Sakai MD, Rose Fillers  Last Assessment & Plan:  There was no evidence of this on her cardiac monitor. I would recommend watchful waiting.   Marland Kitchen History of non-ST elevation myocardial infarction (NSTEMI)    Sept 2014--  thought to be type II HTN w/ LHC without infarct related artery and patent grafts  . History of radiation therapy 05/24/16-07/26/16   right breast 50.4 Gy in 28 fractions, right breast boost 10 Gy in 5 fractions  . Hyperlipidemia   .  Hypertension    Resolved.  . Iron deficiency anemia   . Mania ([place]) 03/05/2016  . Moderate persistent asthma    pulmologist-  Dr. Malvin Johns  . Osteomyelitis of toe of left foot ([place]) 04/06/2016  . Osteopenia of multiple sites 10/19/2015  . PAF (paroxysmal atrial fibrillation) ([place])   . Personal history of chemotherapy 2017  . Personal history of radiation therapy 2017  . PONV (postoperative nausea and vomiting)   . Port catheter in place 02/17/2016  . Psoriasis    right leg  . Renal calculus, right   . S/P AVR    prosthesis valve placement 2009 at same time re-do CABG  . Seizure-like activity ([place]) 09/01/2017  .  Sensorineural hearing loss (SNHL) of both ears 01/06/2016  . Stroke [facility])    residual rt hearing loss  . Syncope 09/06/2017  . Type 2 diabetes mellitus ([place])    monitored by dr Dwyane Dee    Patient Active Problem List   Diagnosis Date Noted  . Pleural effusion on right 09/25/2018  . Thrombocytopenia ([place]) 09/25/2018  . Cholecystitis with cholelithiasis 07/07/2018  . Prolonged Q-T interval on ECG 05/21/2018  . Atrial fibrillation ([place]) 01/23/2018  . Closed fracture of head of left humerus 09/09/2017  . Syncope 09/06/2017  . Seizure-like activity ([place]) 09/01/2017  . AKI (acute kidney injury) ([place]) 09/01/2017  . Adrenal insufficiency ([place]) 07/03/2016  . Seizure ([place]) 06/29/2016  . Chemotherapy-induced peripheral neuropathy ([place]) 04/27/2016  . 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 Osteomyelitis of toe of left foot ([place]) 04/06/2016  . Mania ([place]) 03/05/2016  . GERD (gastroesophageal reflux disease) 02/29/2016  . Chemotherapy induced nausea and vomiting 02/24/2016  . Port catheter in place 02/17/2016  . Sensorineural hearing loss (SNHL) of both ears 01/06/2016  . Breast cancer of upper-inner quadrant of right female breast ([place]) 12/16/2015  . Osteopenia of multiple sites 10/19/2015  . CKD (chronic kidney disease), stage IV ([place]) 09/24/2013  . Chronic diastolic CHF (congestive heart failure) ([place]) 09/24/2013  . Acute on chronic combined systolic and diastolic congestive heart failure ([place]) 07/17/2013  . Atrial tachycardia ([place]) 04/03/2013  . CAD (coronary artery disease) 01/24/2011  . Chronic coronary artery disease 01/24/2011  . History of aortic valve replacement 10/27/2010  . Hyperlipidemia   . DOE (dyspnea on exertion)   . Nonischemic cardiomyopathy ([place])   . Anemia of chronic disease   . Type 2 diabetes mellitus ([place])   . Iron deficiency anemia   . Allergic rhinitis 10/14/2009  . Essential hypertension 05/12/2009  . H/O atrial tachycardia 05/12/2009  . Asthma  05/12/2009    Past Surgical History:  Procedure Laterality Date  . AORTIC VALVE REPLACEMENT  2009   [facility]done same time is [facility])  . BREAST LUMPECTOMY Right 01/18/2016  . BREAST LUMPECTOMY WITH RADIOACTIVE SEED AND SENTINEL LYMPH NODE BIOPSY Right 01/18/2016   Procedure: RIGHT BREAST LUMPECTOMY WITH RADIOACTIVE SEED AND SENTINEL LYMPH NODE BIOPSY;  Surgeon: DAlphonsa Overall MD;  [place]  Service: General;  Laterality: Right;  . CARDIAC CATHETERIZATION  03/23/2008   Pre-redo CABG: L main OK, LAD (T), CFX (T), OM1 99%, RCA (T), LIMA-LAD OK, SVG-OM(?3) OK w/ little florw to OM2, SVG-RCA OK. EF NL  . CARPAL TUNNEL RELEASE    . CHOLECYSTECTOMY N/A 07/07/2018   Procedure: LAPAROSCOPIC CHOLECYSTECTOMY WITH INTRAOPERATIVE CHOLANGIOGRAM ERAS PATHWAY;  Surgeon: NAlphonsa Overall MD;  Location: [facility];  Service: General;  Laterality: N/A;  . COLONOSCOPY     around  2015. Possibly with [facility]   . CORONARY ARTERY BYPASS GRAFT  1998 &  re-do 2009   Had LIMA to DX/LAD, SVG to 2 marginal branches and SVG to [facility] originally; SVG to 3rd OM and PD at time of redo  . CYSTOSCOPY W/ URETERAL STENT PLACEMENT Right 12/20/2014   Procedure: CYSTOSCOPY WITH RETROGRADE PYELOGRAM/URETERAL STENT PLACEMENT;  Surgeon: Cleon Gustin, MD;  Location: [facility];  Service: Urology;  Laterality: Right;  . ESOPHAGOGASTRODUODENOSCOPY     many years ago per patient   . ESOPHAGOGASTRODUODENOSCOPY ENDOSCOPY  06/17/2018  . EYE SURGERY Bilateral    cataracts  . HOLMIUM LASER APPLICATION Right 3/54/6568   Procedure:  HOLMIUM LASER LITHOTRIPSY;  Surgeon: Cleon Gustin, MD;  Location: [facility];  Service: Urology;  Laterality: Right;  . IR THORACENTESIS ASP PLEURAL SPACE W/IMG GUIDE  10/03/2018  . LEFT HEART CATHETERIZATION WITH CORONARY/GRAFT ANGIOGRAM N/A 02/23/2013   Procedure: LEFT HEART CATHETERIZATION WITH Beatrix Fetters;  Surgeon: Blane Ohara, MD;  Location: [facility] CATH LAB;  Service: Cardiovascular;  Laterality: N/A;  . LOOP RECORDER INSERTION N/A 08/30/2017   Procedure: LOOP RECORDER INSERTION;  Surgeon: Evans Lance, MD;  Location: [facility] CV LAB;  Service: Cardiovascular;  Laterality: N/A;  . PORTACATH PLACEMENT Left 01/18/2016   Procedure: INSERTION PORT-A-CATH;  Surgeon: Alphonsa Overall, MD;  Location: [facility];  Service: General;  Laterality: Left;  . portacath removal    . TONSILLECTOMY    . TRANSTHORACIC ECHOCARDIOGRAM  02-24-2013      mild LVH,  ef 50-55%/  AV bioprosthesis was present with very mild stenosis and no regurg., mean grandient 66mHg, peak grandient 228mg /  mild MR/  mild LAE and RAE/  moderate TR  . TUBAL LIGATION       OB History   No obstetric history on file.      Home Medications    Prior to Admission medications   Medication Sig Start Date End Date Taking? Authorizing Provider  acetaminophen (TYLENOL) 325 MG tablet Take 2 tablets (650 mg total) by mouth every 6 (six) hours as needed for mild pain (or Fever >/= 101). 09/03/17  Yes Vann, Jessica U, DO  albuterol (PROVENTIL HFA;VENTOLIN HFA) 108 (90 Base) MCG/ACT inhaler Inhale 2 puffs into the lungs every 6 (six) hours as needed for wheezing or shortness of breath.   Yes [provider]  amiodarone (PACERONE) 200 MG tablet Take 1 tablet (200 mg total) by mouth daily. 02/03/19  Yes TaEvans LanceMD  budesonide-formoterol (SYMBICORT) 160-4.5 MCG/ACT inhaler INHALE TWO PUFFS INTO THE LUNGS TWICE DAILY 09/18/18  Yes Byrum, RoRose FillersMD  ELIQUIS 5 MG TABS tablet TAKE ONE TABLET BY MOUTH TWICE DAILY Patient taking differently: Take 5 mg by mouth 2 (two) times daily.  02/03/19  Yes TaEvans LanceMD  epoetin alfa-epbx (RETACRIT) 4012751NIT/ML injection Inject 40,000 Units into the skin every Friday.   Yes [provider]  exemestane (AROMASIN) 25 MG tablet TAKE ONE TABLET BY MOUTH DAILY AFTER BREAKFAST Patient taking differently: Take  25 mg by mouth daily after breakfast.  09/17/18  Yes GuNicholas LoseMD  ezetimibe (ZETIA) 10 MG tablet TAKE ONE TABLET BY MOUTH EVERY DAY 10/29/18  Yes TaEvans LanceMD  famotidine (PEPCID) 20 MG tablet One after supper Patient taking differently: Take 20 mg by mouth daily after supper.  09/25/18  Yes WeTanda RockersMD  furosemide (LASIX) 20 MG tablet 40 mg once  a week - 20 mg the other 6 days 01/28/19  Yes [name], NP  insulin glargine (LANTUS) 100 UNIT/ML injection Inject 16 units under the skin once daily in the morning. Patient taking differently: Inject 18 Units into the skin daily after breakfast.  09/01/18  Yes Elayne Snare, MD  insulin lispro (HUMALOG) 100 UNIT/ML injection Inject 4-6 units under the skin three times daily before meals. Patient taking differently: Inject 4-8 Units into the skin 3 (three) times daily after meals.  09/04/18  Yes Elayne Snare, MD  loratadine (CLARITIN) 10 MG tablet Take 10 mg by mouth daily.   Yes [provider]  metoprolol tartrate (LOPRESSOR) 25 MG tablet Take 1 tablet (25 mg total) by mouth 2 (two) times daily. 12/24/18 03/24/19 Yes [name], NP  nitroGLYCERIN (NITROSTAT) 0.4 MG SL tablet Place 1 tablet (0.4 mg total) under the tongue every 5 (five) minutes as needed for chest pain. 09/19/18 02/19/19 Yes Evans Lance, MD  potassium chloride SA (K-DUR) 20 MEQ tablet Take 1 tablet (20 mEq total) by mouth daily. 01/19/19  Yes [name], NP  rosuvastatin (CRESTOR) 40 MG tablet Take 1 tablet (40 mg total) by mouth at bedtime. 09/08/18  Yes [name], NP  zolpidem (AMBIEN) 5 MG tablet TAKE ONE TABLET BY MOUTH AT BEDTIME AS NEEDED FOR SLEEP Patient taking differently: Take 5 mg by mouth at bedtime.  12/06/18  Yes Saguier, Percell Miller, PA-C  ACCU-CHEK GUIDE test strip Use to test blood sugar 3 times daily Patient taking differently: 1 each by Other route 3 (three) times daily.  10/21/18   Elayne Snare, MD  Blood Glucose Monitoring Suppl  (ACCU-CHEK AVIVA PLUS) w/Device KIT Use accu chek aviva meter to check blood sugar three times daily. 02/13/19   Elayne Snare, MD  Insulin Pen Needle 32G X 4 MM MISC Use as instructed to inject insulin 4 times daily. 09/02/18   Elayne Snare, MD  midodrine (PROAMATINE) 2.5 MG tablet Take 1 tablet (2.5 mg total) by mouth 3 (three) times daily with meals. Patient not taking: Reported on 02/19/2019 02/19/19   Charlie Pitter, PA-C    Family History Family History  Problem Relation Age of Onset  . Heart disease Father   . Heart failure Father   . Diabetes Maternal Grandmother   . Heart disease Maternal Grandmother   . Diabetes Son   . Healthy Brother        #1  . Heart attack Brother        #2  . Heart disease Brother        #2  . Colon cancer Neg Hx   . Esophageal cancer Neg Hx     Social History Social History   Tobacco Use  . Smoking status: Never Smoker  . Smokeless tobacco: Never Used  Substance Use Topics  . Alcohol use: No  . Drug use: No     Allergies   Amoxicillin, Tape, Aldactone [spironolactone], Imdur [isosorbide dinitrate], Arimidex [anastrozole], Latex, and Tetracycline   Review of Systems Review of Systems  Constitutional: Positive for activity change.  Respiratory: Positive for shortness of breath.   Allergic/Immunologic: Negative for immunocompromised state.  Hematological: Bruises/bleeds easily.  All other systems reviewed and are negative.    Physical Exam Updated Vital Signs BP (!) 142/63   Pulse (!) 55   Temp 98.8 F (37.1 C) (Oral)   Resp 17   Ht 5' 5"  (1.651 m)   Wt 75 kg  SpO2 99%   BMI 27.52 kg/m   Physical Exam Vitals signs and nursing note reviewed.  Constitutional:      Appearance: She is well-developed.  HENT:     Head: Normocephalic and atraumatic.  Neck:     Musculoskeletal: Normal range of motion and neck supple.  Cardiovascular:     Rate and Rhythm: Normal rate.  Pulmonary:     Effort: Pulmonary effort is normal.      Breath sounds: No wheezing or rhonchi.     Comments: Diminished breath sounds in the R lung Abdominal:     General: Bowel sounds are normal.  Skin:    General: Skin is warm and dry.  Neurological:     Mental Status: She is alert and oriented to person, place, and time.      ED Treatments / Results  Labs (all labs ordered are listed, but only abnormal results are displayed) Labs Reviewed  SARS CORONAVIRUS 2 (TAT 6-24 HRS)    EKG EKG Interpretation  Date/Time:  Thursday February 19 2019 21:49:36 EDT Ventricular Rate:  61 PR Interval:    QRS Duration: 113 QT Interval:  539 QTC Calculation: 543 R Axis:   -24 Text Interpretation:  Unknown rhythm, irregular rate Borderline intraventricular conduction delay Borderline repolarization abnormality Prolonged QT interval No acute changes No significant change since last tracing prolonged QTc Confirmed by Varney Biles [phone]) on 02/19/2019 10:49:50 PM   Radiology Dg Chest 2 View  Result Date: 02/19/2019 CLINICAL DATA:  Shortness of breath for 2 weeks EXAM: CHEST - 2 VIEW COMPARISON:  01/27/2019 FINDINGS: Cardiac shadow is stable in appearance. Postsurgical changes are again noted. Loop recorder is again seen. Slight increase in right-sided pleural effusion with underlying atelectasis is noted. IMPRESSION: Increasing right-sided pleural effusion with underlying atelectasis. Electronically Signed   By: Inez Catalina M.D.   On: 02/19/2019 12:24    Procedures Procedures (including critical care time)  Medications Ordered in ED Medications  potassium chloride SA (K-DUR) CR tablet 20 mEq (20 mEq Oral Given 02/19/19 2208)     Initial Impression / Assessment and Plan / ED Course  I have reviewed the triage vital signs and the nursing notes.  Pertinent labs & imaging results that were available during my care of the patient were reviewed by me and considered in my medical decision making (see chart for details).        73 year old  comes in a chief complaint of shortness of breath and weakness. She was advised to come to the ER by her cardiologist after a visit earlier today.  On exam patient has pitting edema, diminished breath sounds on the right lower lung field.  Chest x-ray shows large pleural effusion.  She had required thoracentesis with about 500 cc of fluid in April.  Cytology testing was negative at that time.  She has no cough, infection-like symptoms.  She is having acute CHF-like symptoms.  Reviewed the lab work-up and imaging from her visit earlier today.  No need for additional testing at this time.  We will admit her for optimization.  Final Clinical Impressions(s) / ED Diagnoses   Final diagnoses:  Pleural effusion  Acute renal failure superimposed on chronic kidney disease, unspecified CKD stage, unspecified acute renal failure type [facility])    ED Discharge Orders    None           Varney Biles, MD 02/19/19 2256

## 2019-02-20 ENCOUNTER — Other Ambulatory Visit: Payer: Self-pay

## 2019-02-20 ENCOUNTER — Telehealth: Payer: Self-pay | Admitting: Hematology and Oncology

## 2019-02-20 ENCOUNTER — Inpatient Hospital Stay: Payer: Medicare Other

## 2019-02-20 ENCOUNTER — Inpatient Hospital Stay (HOSPITAL_COMMUNITY): Payer: Medicare Other

## 2019-02-20 DIAGNOSIS — N179 Acute kidney failure, unspecified: Secondary | ICD-10-CM

## 2019-02-20 DIAGNOSIS — E876 Hypokalemia: Secondary | ICD-10-CM

## 2019-02-20 DIAGNOSIS — I5033 Acute on chronic diastolic (congestive) heart failure: Secondary | ICD-10-CM

## 2019-02-20 DIAGNOSIS — N189 Chronic kidney disease, unspecified: Secondary | ICD-10-CM

## 2019-02-20 DIAGNOSIS — I4891 Unspecified atrial fibrillation: Secondary | ICD-10-CM

## 2019-02-20 LAB — BASIC METABOLIC PANEL
Anion gap: 10 (ref 5–15)
BUN: 24 mg/dL — ABNORMAL HIGH (ref 8–23)
CO2: 28 mmol/L (ref 22–32)
Calcium: 8.5 mg/dL — ABNORMAL LOW (ref 8.9–10.3)
Chloride: 103 mmol/L (ref 98–111)
Creatinine, Ser: 3 mg/dL — ABNORMAL HIGH (ref 0.44–1.00)
GFR calc Af Amer: 17 mL/min — ABNORMAL LOW (ref >60)
GFR calc non Af Amer: 15 mL/min — ABNORMAL LOW (ref >60)
Glucose, Bld: 172 mg/dL — ABNORMAL HIGH (ref 70–99)
Potassium: 3.4 mmol/L — ABNORMAL LOW (ref 3.5–5.1)
Sodium: 141 mmol/L (ref 135–145)

## 2019-02-20 LAB — CBC
HCT: 30.4 % — ABNORMAL LOW (ref 36.0–46.0)
Hemoglobin: 8.7 g/dL — ABNORMAL LOW (ref 12.0–15.0)
MCH: 29.3 pg (ref 26.0–34.0)
MCHC: 28.6 g/dL — ABNORMAL LOW (ref 30.0–36.0)
MCV: 102.4 fL — ABNORMAL HIGH (ref 80.0–100.0)
Platelets: 106 10*3/uL — ABNORMAL LOW (ref 150–400)
RBC: 2.97 MIL/uL — ABNORMAL LOW (ref 3.87–5.11)
RDW: 14.3 % (ref 11.5–15.5)
WBC: 5.8 10*3/uL (ref 4.0–10.5)
nRBC: 0 % (ref 0.0–0.2)

## 2019-02-20 LAB — CBG MONITORING, ED
Glucose-Capillary: 137 mg/dL — ABNORMAL HIGH (ref 70–99)
Glucose-Capillary: 166 mg/dL — ABNORMAL HIGH (ref 70–99)
Glucose-Capillary: 166 mg/dL — ABNORMAL HIGH (ref 70–99)

## 2019-02-20 LAB — SARS CORONAVIRUS 2 (TAT 6-24 HRS): SARS Coronavirus 2: NEGATIVE

## 2019-02-20 LAB — MAGNESIUM: Magnesium: 2.1 mg/dL (ref 1.7–2.4)

## 2019-02-20 MED ORDER — METOPROLOL TARTRATE 25 MG PO TABS
25.0000 mg | ORAL_TABLET | Freq: Two times a day (BID) | ORAL | Status: DC
  Administered 2019-02-20 – 2019-02-21 (×3): 25 mg via ORAL
  Filled 2019-02-20 (×3): qty 1

## 2019-02-20 MED ORDER — APIXABAN 5 MG PO TABS
5.0000 mg | ORAL_TABLET | Freq: Two times a day (BID) | ORAL | Status: DC
  Administered 2019-02-20 – 2019-02-21 (×3): 5 mg via ORAL
  Filled 2019-02-20 (×4): qty 1

## 2019-02-20 MED ORDER — SODIUM CHLORIDE 0.9 % IV SOLN
1.0000 g | INTRAVENOUS | Status: DC
  Administered 2019-02-20: 1 g via INTRAVENOUS
  Filled 2019-02-20: qty 10

## 2019-02-20 MED ORDER — INSULIN GLARGINE 100 UNIT/ML ~~LOC~~ SOLN
18.0000 [IU] | Freq: Every day | SUBCUTANEOUS | Status: DC
  Administered 2019-02-20 – 2019-02-21 (×2): 18 [IU] via SUBCUTANEOUS
  Filled 2019-02-20 (×2): qty 0.18

## 2019-02-20 MED ORDER — ZOLPIDEM TARTRATE 5 MG PO TABS
5.0000 mg | ORAL_TABLET | Freq: Every day | ORAL | Status: DC
  Administered 2019-02-21: 5 mg via ORAL
  Filled 2019-02-20: qty 1

## 2019-02-20 MED ORDER — LORATADINE 10 MG PO TABS
10.0000 mg | ORAL_TABLET | Freq: Every day | ORAL | Status: DC
  Administered 2019-02-20 – 2019-02-21 (×2): 10 mg via ORAL
  Filled 2019-02-20 (×2): qty 1

## 2019-02-20 MED ORDER — ROSUVASTATIN CALCIUM 20 MG PO TABS
40.0000 mg | ORAL_TABLET | Freq: Every day | ORAL | Status: DC
  Administered 2019-02-20: 40 mg via ORAL
  Filled 2019-02-20: qty 2

## 2019-02-20 MED ORDER — POTASSIUM CHLORIDE CRYS ER 20 MEQ PO TBCR
40.0000 meq | EXTENDED_RELEASE_TABLET | Freq: Once | ORAL | Status: AC
  Administered 2019-02-20: 40 meq via ORAL
  Filled 2019-02-20: qty 2

## 2019-02-20 MED ORDER — EXEMESTANE 25 MG PO TABS
25.0000 mg | ORAL_TABLET | Freq: Every day | ORAL | Status: DC
  Administered 2019-02-20 – 2019-02-21 (×2): 25 mg via ORAL
  Filled 2019-02-20 (×2): qty 1

## 2019-02-20 MED ORDER — INSULIN ASPART 100 UNIT/ML ~~LOC~~ SOLN
0.0000 [IU] | Freq: Three times a day (TID) | SUBCUTANEOUS | Status: DC
  Administered 2019-02-20: 2 [IU] via SUBCUTANEOUS
  Administered 2019-02-20: 1 [IU] via SUBCUTANEOUS
  Administered 2019-02-20 – 2019-02-21 (×2): 2 [IU] via SUBCUTANEOUS

## 2019-02-20 MED ORDER — ACETAMINOPHEN 325 MG PO TABS: 650.0000 mg | ORAL_TABLET | Freq: Four times a day (QID) | ORAL | Status: DC | PRN

## 2019-02-20 MED ORDER — EZETIMIBE 10 MG PO TABS
10.0000 mg | ORAL_TABLET | Freq: Every day | ORAL | Status: DC
  Administered 2019-02-20 – 2019-02-21 (×2): 10 mg via ORAL
  Filled 2019-02-20 (×2): qty 1

## 2019-02-20 MED ORDER — FUROSEMIDE 10 MG/ML IJ SOLN: 40.0000 mg | Freq: Every day | INTRAMUSCULAR | Status: DC

## 2019-02-20 MED ORDER — FUROSEMIDE 10 MG/ML IJ SOLN
40.0000 mg | Freq: Once | INTRAMUSCULAR | Status: AC
  Administered 2019-02-20: 40 mg via INTRAVENOUS
  Filled 2019-02-20: qty 4

## 2019-02-20 MED ORDER — MOMETASONE FURO-FORMOTEROL FUM 200-5 MCG/ACT IN AERO
2.0000 | INHALATION_SPRAY | Freq: Two times a day (BID) | RESPIRATORY_TRACT | Status: DC
  Administered 2019-02-20 – 2019-02-21 (×3): 2 via RESPIRATORY_TRACT
  Filled 2019-02-20: qty 8.8

## 2019-02-20 MED ORDER — POTASSIUM CHLORIDE CRYS ER 20 MEQ PO TBCR
20.0000 meq | EXTENDED_RELEASE_TABLET | Freq: Once | ORAL | Status: AC
  Administered 2019-02-20: 20 meq via ORAL
  Filled 2019-02-20: qty 1

## 2019-02-20 MED ORDER — ALBUTEROL SULFATE (2.5 MG/3ML) 0.083% IN NEBU: 2.5000 mg | INHALATION_SOLUTION | Freq: Four times a day (QID) | RESPIRATORY_TRACT | Status: DC | PRN

## 2019-02-20 MED ORDER — SODIUM CHLORIDE 0.9 % IV SOLN
500.0000 mg | INTRAVENOUS | Status: DC
  Administered 2019-02-20: 500 mg via INTRAVENOUS
  Filled 2019-02-20: qty 500

## 2019-02-20 NOTE — Telephone Encounter (Signed)
Returned pt's call.  She has been advised to have a Nurse page the Hospitalist Dr to see if they can get things on the go for her. She will do that and thanked [place] for the call.

## 2019-02-20 NOTE — Telephone Encounter (Signed)
Called patient per 9/11 sch message in regards to her appts today.  Patient decided to cancel them because she is in the hospital and stated she will call Monday to reschedule if possible.

## 2019-02-20 NOTE — Progress Notes (Signed)
CARDIOLOGY OFFICE NOTE  Date:  02/23/2019    Hannah [name] Date of Birth: 1946/01/01 Medical Record #[id number]  PCP:  Mackie Pai, PA-C  Cardiologist:  Atilano Median    Chief Complaint  Patient presents with   Follow-up    Post hospital     History of Present Illness: Hannah [name] is a 73 y.o. female who presents today for a post hospital visit. Seen for Dr. Lovena Le.   She has a very complex history - this includes a history of CAD s/p CABG (1998 with redo 2009), bioprosthetic AVR in 9211, chronic diastolic CHF, asthma, carotid artery disease (1-39% by duplex 10/2017, due 2021), CKD stage IV, PAF - has been on Multaq in the past and now on amiodarone, atrial tachycardia, breast CA, orthostasis, chronic anemia, thrombocytopenia, mild dilation of ascending aorta by echo 10/2018, recurrent syncope/orthostasis, recurrent pleural effusion requiring thoracentesis 09/2018, HTN, GERD, & HLD.   I have followed her for many years - she has had periodic issues with volume overload, orthostasis and tachy palpitations. Last cath in 2014 - grafts patent. Myoview in 2015 without ischemia.  Over the past few months she has had more issues with her pleural effusion - diuretics caused worsening CKD/orthostasis. She has gotten more anemic - back to hematology and now on therapy. Concern for MDS and will be having bone marrow biopsy - also needing colonoscopy due to profound anemia (HGB down to 6 which is highly unusual for Hannah [name]). Most recent abdominal ultrasound noted and concerning for liver abnormality. Recent mammogram also recently noted and abnormal - ended up having repeat biopsy.   I saw her about a month ago - we had restarted low dose diuretics for recurring pleural effusion - she was better at my visit but I was still quite concerned that there is "something else going on" that has just not been found yet.  She has had progressive failure to thrive. Seen last week as a work in by  [employer], [place] for worsening dyspnea - increasing pleural effusion - worsening CKD - referred to the ED and was then admitted - treated for pneumonia.  We kept our follow up for here.   The patient does not have symptoms concerning for COVID-19 infection (fever, chills, cough, or new shortness of breath).   Comes in today. Here alone. Got home on Saturday. She does feel better than when she was here last week. She was surprised of the diagnosis of pneumonia - she is on oral Levaquin for 2 more days. CXR's look unchanged by the reports that I see. Her weight is back down. She notes that her symptoms came on pretty abruptly - she does not really use salt.   She is for colonoscopy next week. She is seeing heme/onc this week. She was sent home on iron - she was told to NOT take Midodrine (she had actually never picked this up) - BP higher there while in the hospital and nothing like it is at home. She is quite worried about getting orthostatic again - she has had prior injury with her orthostasis. She did have an abnormal mammogram - has had 4 biopsies - all turned out ok. Her weight is back down. No chest pain. She is not aware of her abdominal US results. Has some chronic GI issues.   Past Medical History:  Diagnosis Date   Abnormally small mouth    Allergic rhinitis 10/14/2009   Qualifier: Diagnosis of  By: Lamonte Sakai  MD, Rose Fillers   Overview:  Overview:  Qualifier: Diagnosis of  By: Lamonte Sakai MD, Rose Fillers  Last Assessment & Plan:  Please continue Xyzal and Nasacort as you have been using them   Anemia    Asthma 05/12/2009   10/12/2014 p extensive coaching HFA effectiveness =    75% s spacer    Overview:  Overview:  10/12/2014 p extensive coaching HFA effectiveness =    75% s spacer   Last Assessment & Plan:  Please continue Symbicort 2 puffs twice a day. Remember to rinse and gargle after taking this medication. Take albuterol 2 puffs up to every 4 hours if needed for shortness of breath.  Follow with Dr Lamonte Sakai in  6 month   Bilateral carotid artery stenosis    a. mild 1-39% by duplex 10/2017, due 2021.   Breast cancer ([place]) 01/18/2016   right breast   CAD (coronary artery disease) 01/24/2011   a. s/p CABG 1998 with redo 2009.   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   Chemotherapy-induced thrombocytopenia 04/06/2016   Chronic diastolic CHF (congestive heart failure) ([place]) 09/24/2013   CKD (chronic kidney disease), stage IV ([place]) 09/24/2013   Closed fracture of head of left humerus 06/19/6220   Complication of anesthesia    Essential hypertension 05/12/2009   Qualifier: Diagnosis of  By: Lamonte Sakai MD, Rose Fillers    Gallstones    GERD (gastroesophageal reflux disease)    Glaucoma    H/O atrial tachycardia 05/12/2009   Qualifier: History of  By: Lamonte Sakai MD, Rose Fillers   Overview:  Overview:  Qualifier: History of  By: Lamonte Sakai MD, Rose Fillers  Last Assessment & Plan:  There was no evidence of this on her cardiac monitor. I would recommend watchful waiting.    History of non-ST elevation myocardial infarction (NSTEMI)    Sept 2014--  thought to be type II HTN w/ LHC without infarct related artery and patent grafts   History of radiation therapy 05/24/16-07/26/16   right breast 50.4 Gy in 28 fractions, right breast boost 10 Gy in 5 fractions   Hyperlipidemia    Hypertension    Resolved.   Iron deficiency anemia    Mania ([place]) 03/05/2016   Moderate persistent asthma    pulmologist-  Dr. Malvin Johns   Osteomyelitis of toe of left foot ([place]) 04/06/2016   Osteopenia of multiple sites 10/19/2015   PAF (paroxysmal atrial fibrillation) ([place])    Personal history of chemotherapy 2017   Personal history of radiation therapy 2017   PONV (postoperative nausea and vomiting)    Port catheter in place 02/17/2016   Psoriasis    right leg   Renal calculus, right    S/P AVR    prosthesis valve placement 2009 at same time re-do CABG    Seizure-like activity ([place]) 09/01/2017   Sensorineural hearing loss (SNHL) of both ears 01/06/2016   Stroke ([place])    residual rt hearing loss   Syncope 09/06/2017   Type 2 diabetes mellitus ([place])    monitored by dr Dwyane Dee    Past Surgical History:  Procedure Laterality Date   AORTIC VALVE REPLACEMENT  2009   [facility]done same time is [facility]G)   BREAST LUMPECTOMY Right 01/18/2016   BREAST LUMPECTOMY WITH RADIOACTIVE SEED AND SENTINEL LYMPH NODE BIOPSY Right 01/18/2016   Procedure: RIGHT BREAST LUMPECTOMY WITH RADIOACTIVE SEED AND SENTINEL LYMPH NODE BIOPSY;  Surgeon: DAlphonsa Overall MD;  Location: [facility] OR;  Service: General;  Laterality: Right;   CARDIAC CATHETERIZATION  03/23/2008   Pre-redo CABG: L main OK, LAD (T), CFX (T), OM1 99%, RCA (T), LIMA-LAD OK, SVG-OM(?3) OK w/ little florw to OM2, SVG-RCA OK. EF NL   CARPAL TUNNEL RELEASE     CHOLECYSTECTOMY N/A 07/07/2018   Procedure: LAPAROSCOPIC CHOLECYSTECTOMY WITH INTRAOPERATIVE CHOLANGIOGRAM ERAS PATHWAY;  Surgeon: Alphonsa Overall, MD;  Location: [facility] ORS;  Service: General;  Laterality: N/A;   COLONOSCOPY     around 2015. Possibly with [place] &  re-do 2009   Had LIMA to DX/LAD, SVG to 2 marginal branches and SVG to [facility] originally; SVG to 3rd OM and PD at time of redo   CYSTOSCOPY W/ URETERAL STENT PLACEMENT Right 12/20/2014   Procedure: CYSTOSCOPY WITH RETROGRADE PYELOGRAM/URETERAL STENT PLACEMENT;  Surgeon: Cleon Gustin, MD;  Location: [facility];  Service: Urology;  Laterality: Right;   ESOPHAGOGASTRODUODENOSCOPY     many years ago per patient    ESOPHAGOGASTRODUODENOSCOPY ENDOSCOPY  06/17/2018   EYE SURGERY Bilateral    cataracts   HOLMIUM LASER APPLICATION Right 6/60/6301   Procedure:  HOLMIUM LASER LITHOTRIPSY;  Surgeon: Cleon Gustin, MD;  Location: [facility];  Service: Urology;  Laterality: Right;   IR  THORACENTESIS ASP PLEURAL SPACE W/IMG GUIDE  10/03/2018   LEFT HEART CATHETERIZATION WITH CORONARY/GRAFT ANGIOGRAM N/A 02/23/2013   Procedure: LEFT HEART CATHETERIZATION WITH Beatrix Fetters;  Surgeon: Blane Ohara, MD;  Location: [facility] CATH LAB;  Service: Cardiovascular;  Laterality: N/A;   LOOP RECORDER INSERTION N/A 08/30/2017   Procedure: LOOP RECORDER INSERTION;  Surgeon: Evans Lance, MD;  Location: [place] CV LAB;  Service: Cardiovascular;  Laterality: N/A;   PORTACATH PLACEMENT Left 01/18/2016   Procedure: INSERTION PORT-A-CATH;  Surgeon: Alphonsa Overall, MD;  Location: [place];  Service: General;  Laterality: Left;   portacath removal     TONSILLECTOMY     TRANSTHORACIC ECHOCARDIOGRAM  02-24-2013      mild LVH,  ef 50-55%/  AV bioprosthesis was present with very mild stenosis and no regurg., mean grandient 30mHg, peak grandient 229mg /  mild MR/  mild LAE and RAE/  moderate TR   TUBAL LIGATION       Medications: Current Meds  Medication Sig   ACCU-CHEK GUIDE test strip Use to test blood sugar 3 times daily   acetaminophen (TYLENOL) 325 MG tablet Take 2 tablets (650 mg total) by mouth every 6 (six) hours as needed for mild pain (or Fever >/= 101).   albuterol (PROVENTIL HFA;VENTOLIN HFA) 108 (90 Base) MCG/ACT inhaler Inhale 2 puffs into the lungs every 6 (six) hours as needed for wheezing or shortness of breath.   amiodarone (PACERONE) 200 MG tablet Take 1 tablet (200 mg total) by mouth daily.   Blood Glucose Monitoring Suppl (ACCU-CHEK AVIVA PLUS) w/Device KIT Use accu chek aviva meter to check blood sugar three times daily.   budesonide-formoterol (SYMBICORT) 160-4.5 MCG/ACT inhaler INHALE TWO PUFFS INTO THE LUNGS TWICE DAILY   ELIQUIS 5 MG TABS tablet TAKE ONE TABLET BY MOUTH TWICE DAILY   epoetin alfa-epbx (RETACRIT) 4060109NIT/ML injection Inject 40,000 Units into the skin every Friday.   exemestane (AROMASIN) 25 MG tablet TAKE ONE TABLET BY MOUTH DAILY  AFTER BREAKFAST   ezetimibe (ZETIA) 10 MG tablet TAKE ONE TABLET BY MOUTH EVERY DAY   famotidine (PEPCID) 20 MG tablet One after supper  ferrous sulfate 325 (65 FE) MG EC tablet Take 1 tablet (325 mg total) by mouth 2 (two) times daily.   furosemide (LASIX) 40 MG tablet Take 1 tablet (40 mg total) by mouth daily. Alternate between 20 mg and 40 mg daily   insulin glargine (LANTUS) 100 UNIT/ML injection Inject 16 units under the skin once daily in the morning.   insulin lispro (HUMALOG) 100 UNIT/ML injection Inject 4-6 units under the skin three times daily before meals.   Insulin Pen Needle 32G X 4 MM MISC Use as instructed to inject insulin 4 times daily.   levofloxacin (LEVAQUIN) 250 MG tablet Take 1 tablet (250 mg total) by mouth every other day for 4 days.   loratadine (CLARITIN) 10 MG tablet Take 10 mg by mouth daily.   metoprolol tartrate (LOPRESSOR) 25 MG tablet Take 1 tablet (25 mg total) by mouth 2 (two) times daily.   potassium chloride SA (K-DUR) 20 MEQ tablet Take 1 tablet (20 mEq total) by mouth daily.   rosuvastatin (CRESTOR) 40 MG tablet Take 1 tablet (40 mg total) by mouth at bedtime.   zolpidem (AMBIEN) 5 MG tablet TAKE ONE TABLET BY MOUTH AT BEDTIME AS NEEDED FOR SLEEP   [DISCONTINUED] furosemide (LASIX) 40 MG tablet Take 1 tablet (40 mg total) by mouth daily.   [DISCONTINUED] potassium chloride SA (K-DUR) 20 MEQ tablet Take 1 tablet (20 mEq total) by mouth daily.     Allergies: Allergies  Allergen Reactions   Amoxicillin Rash and Other (See Comments)    Tolerates Cephalosporins Has patient had a PCN reaction causing immediate rash, facial/tongue/throat swelling, SOB or lightheadedness with hypotension: Yes Has patient had a PCN reaction causing severe rash involving mucus membranes or skin necrosis: Yes Has patient had a PCN reaction that required hospitalization No Has patient had a PCN reaction occurring within the last 10 years: No If all of the above  answers are "NO", then may proceed with Cephalosporin use.    Tape Other (See Comments)    Must use paper tape   Aldactone [Spironolactone] Other (See Comments)    CKD/hypokalemia   Imdur [Isosorbide Dinitrate] Other (See Comments)    Headache/severe hypotension/Syncope   Arimidex [Anastrozole] Nausea Only   Latex Itching and Other (See Comments)    (Dentist office)   Tetracycline Rash    Social History: The patient  reports that she has never smoked. She has never used smokeless tobacco. She reports that she does not drink alcohol or use drugs.   Family History: The patient's family history includes Diabetes in her maternal grandmother and son; Healthy in her brother; Heart attack in her brother; Heart disease in her brother, father, and maternal grandmother; Heart failure in her father.   Review of Systems: Please see the history of present illness.   All other systems are reviewed and negative.   Physical Exam: VS:  BP (!) 122/58    Pulse 60    Ht _0  (1.651 m)    Wt 159 lb 6.4 oz (72.3 kg)    SpO2 98%    BMI 26.53 kg/m  .  BMI Body mass index is 26.53 kg/m.  Wt Readings from Last 3 Encounters:  02/23/19 159 lb 6.4 oz (72.3 kg)  02/21/19 158 lb 8 oz (71.9 kg)  02/19/19 165 lb 6.4 oz (75 kg)    General: Pleasant. She looks chronically ill. Color quite sallow.  Her weight is down from 168# from my last visit last month.  HEENT:  Normal.  Neck: Supple, no JVD, carotid bruits, or masses noted.  Cardiac: Regular rate and rhythm. Outflow murmur noted. Trace edema. She has her support stockings on.  Respiratory:  Lungs are fairly clear - little decreased on the right. GI: Soft and nontender.  MS: No deformity or atrophy. Gait and ROM intact.  Skin: Warm and dry. Color is chronically sallow.  Neuro:  Strength and sensation are intact and no gross focal deficits noted.  Psych: Alert, appropriate and with normal affect.   LABORATORY DATA:  EKG:  EKG is not ordered  today.  Lab Results  Component Value Date   WBC 5.8 02/20/2019   HGB 8.7 (L) 02/20/2019   HCT 30.4 (L) 02/20/2019   PLT 106 (L) 02/20/2019   GLUCOSE 153 (H) 02/21/2019   CHOL 106 12/02/2018   TRIG 97 12/02/2018   HDL 34 (L) 12/02/2018   LDLDIRECT 53.0 10/08/2016   LDLCALC 53 12/02/2018   ALT 17 02/19/2019   AST 31 02/19/2019   NA 141 02/21/2019   K 3.4 (L) 02/21/2019   CL 105 02/21/2019   CREATININE 2.99 (H) 02/21/2019   BUN 29 (H) 02/21/2019   CO2 27 02/21/2019   TSH 2.900 01/19/2019   INR 1.91 05/21/2018   HGBA1C 5.6 02/09/2019   MICROALBUR 53.4 (H) 05/12/2018     BNP (last 3 results) No results for input(s): BNP in the last 8760 hours.  ProBNP (last 3 results) Recent Labs    09/25/18 1113 10/28/18 1041 02/19/19 1126  PROBNP 406.0* 364.0* 2,996*     Other Studies Reviewed Today:  CXR IMPRESSION 02/20/2019: Small right effusion and right lower lobe airspace disease unchanged. Negative for edema.   Electronically Signed   By: Franchot Gallo M.D.   On: 02/20/2019 15:39   ABD [place] 02/17/2019  Other findings: There is a somewhat lobulated fluid collection identified along the inferior aspect of the liver. This measures 2.6 x 3.4 x 4.3 cm. This was not seen on the prior CT examination and may be related to a focal fluid collection from prior cholecystectomy. Mild perihepatic ascites is noted.  Note is made of right-sided pleural effusion.  IMPRESSION: Status post cholecystectomy. There is a lobulated cystic area as described above along the inferior right hepatic margin. This could be related to the prior surgery possibly representing a small seroma. CT may be helpful for further evaluation.  Small right pleural effusion.  Mild ascites.  Increased echogenicity within the liver likely related to fatty infiltration or underlying hepatocellular disease.  Electronically Signed   By: Inez Catalina M.D.   On: 02/17/2019 10:18  Breast  Ultrasound 01/2019 IMPRESSION: 1. New irregular hypoechoic mass along the posterior margin of the scar right breast 8 o'clock position. Findings are nonspecific however concerning for the possibility of localized recurrence. 2. Within the outer aspect of the right axilla there are two adjacent irregular hypoechoic masses just deep to the skin which are nonspecific. These may represent focal fat necrosis or potentially recurrent or metastatic disease.  RECOMMENDATION: 1. Ultrasound-guided core needle biopsy right breast mass 8 o'clock position just deep to the scar. 2. Ultrasound-guided core needle biopsy of both irregular hypoechoic masses within the outer aspect of the right axilla.     ECHOIMPRESSIONS5/2020  1. The left ventricle has normal systolic function with an ejection fraction of 60-65%. The cavity size was normal. There is mildly increased left ventricular wall thickness. Left ventricular diastolic Doppler parameters are consistent with  pseudonormalization. 2. The right  ventricle has normal systolic function. The cavity was mildly enlarged. 3. Left atrial size was moderately dilated. 4. Right atrial size was mildly dilated. 5. The mitral valve is grossly normal. Mild thickening of the mitral valve leaflet. There is mild mitral annular calcification present. 6. The tricuspid valve is grossly normal. 7. There is mild dilatation of the ascending aorta measuring 40 mm. 8. Normal LV systolic function; moderate diastolic dysfunction; mild LVH; mildly dilated ascending aorta; s/p AVR with mean gradient of 18 mmHg and no AI; biatrial enlargement; mild MR; mild RVE.   EchoStudy Conclusions8/2019  - Left ventricle: The cavity size was normal. Systolic function was normal. The estimated ejection fraction was in the range of 60% to 65%.Wall motion was normal; there were no regional wall motion abnormalities. Features are consistent with a  pseudonormal left ventricular filling pattern, with concomitant abnormal relaxation and increased filling pressure (grade 2 diastolic dysfunction). Doppler parameters are consistent with high ventricular filling pressure. - Aortic valve: A bioprosthesis was present and functioning normally. Mean gradient (S): 16 mm Hg. - Mitral valve: Calcified annulus. There was mild regurgitation. - Left atrium: The atrium was moderately dilated. - Right ventricle: The cavity size was mildly dilated. Wall thickness was normal. - Tricuspid valve: There was mild regurgitation. - Pulmonary arteries: Systolic pressure could not be accurately estimated.   MYOVIEW FINDINGS FROM 06/2013: Normal resting EKG. Slight ST depressions noted in aVL after administration of lexiscan. Mild shortness of breath and nausea resolved after the test. EKG is nondiagnostic for ischemia. TID ratio 1.05. Lung-heart ratio 0.43. Normal ventricular chamber size.  IMPRESSION: No evidence for ischemia. Normal wall motion. LVEF 72%.  Electronically Signed By: Guy Sandifer   Cardiac Cath: 02/23/2013 Left mainstem: Normal  Left anterior descending (LAD): Severe proximal and mid calcification with long proximal 95% stenosis. The mid and distal vessel is small but free of high grade disease.  Left circumflex (LCx): AV groove has a mid 90% stenosis before a moderate sized MOM. OM1 and OM2 are occluded at the ostium and fill via the SVG. OM3 occluded at the ostium and fills via SVG. The grafted OMs are small and diffusely disease.  Right coronary artery (RCA): Occluded in the mid vessel. The PDA is moderate sized and occluded at the ostium. There is a 70% stenosis in the PDA after the insertion of the vein graft.  Grafts:  LIMA to LAD: Patent  SVG to RCA: Occluded (from original CABG)  SVG to RCA: Patent (from redo CABG). This is mild diffuse plaque within the vein graft.  SVG to OM3: Patent. This is mild  diffuse plaque within the vein graft.  SVG sequential to OM1/OM2: Patent. There is ostial 50% stenosis. There is a patent proximal SVG stent. The native marginals are very small.  Left ventriculography: LV not injected. AVR not crossed.  Final Conclusions: Severe native vessel CAD. Patent grafts as described with nonobstructive disease in the grafts. A stent placed into the sequential SVG is patent with some disease at the ostium that on several views in not occlusive. I did compare the 2009 cath with results today. There is high grade disease in the circumflex AV groove. However, this is not changed from 2009. However, this lesion does appear to lead into a non grafted OM. If she has further symptoms I would consider PCI of the native circumflex.     Assessment/Plan:  1. Recent admission for worsening shortness of breath - most likely multifactorial with chronic diastolic CHF, recurrent  effusion - made worse with severe anemia that is being evaluated - known orthostasis with prior injury. Has been treated with IV lasix - now on higher oral doses, also given course of antibiotics.   Will plan for repeat CXR next week - may need to consider CT of the chest without contrast.   2. Abnormal Abd [place] - may need CT - she is going to call GI and get their input.   3. Profound anemia - may still be needing BMS - plans for colonoscopy next week. She is on iron - oral replacement - she will discuss continuing this with Dr. Lindi Adie - seeing later this week.   4. CAD/aortic valve disease - remote CABG/AVR - managed medically - no active symptoms and would favor ongoing medical therapy given her degree of CKD  5. CKD - stage IV - she is going to call and make appointment to see nephrology.  6. History of orthostasis - reports negative adrenal work up - I see where she had an Renova from March 2019 - not sure if we need to re-consider this going forward.  7. PAF - in sinus by exam today - on Eliquis  8.  Carotid disease-noted 1 to 39% bilateral stenosis from study 10/2017 -will need repeat study 10/2019- not discussed today  9. HTN - BP is fine - typically always lower at home.   10. Prior breast cancer - recent abnormal mammogram - s/p biopsies.   10. COVID-19 Education: The signs and symptoms of COVID-19 were discussed with the patient and how to seek care for testing (follow up with PCP or arrange E-visit).  The importance of social distancing, staying at home, hand hygiene and wearing a mask when out in public were discussed today.  She was asking about new primary care - this may be somewhat difficult but I gave her some suggestions. She does see multiple specialists.   Current medicines are reviewed with the patient today.  The patient does not have concerns regarding medicines other than what has been noted above.  The following changes have been made:  See above.  Labs/ tests ordered today include:    Orders Placed This Encounter  Procedures   Pro b natriuretic peptide   Basic Metabolic Panel (BMET)   CBC w/Diff     Disposition:   FU with me as planned next week - lab today - CXR prior to next eeks visit.    Patient is agreeable to this plan and will call if any problems develop in the interim.   SignedTruitt Merle, NP  02/23/2019 12:44 PM  [facility] [address] [place], [place]  [address] Phone: [phone] Fax: [phone]

## 2019-02-20 NOTE — Progress Notes (Signed)
PROGRESS NOTE    Hannah [name]  NID:[id number] DOB: Jan 06, 1946 DOA: 02/19/2019 PCP: Mackie Pai, PA-C    Brief Narrative:  73 year old lady with prior history of chronic diastolic heart failure, coronary artery status post CABG, AVR, asthma, stage IV CKD, hypertension, hyperlipidemia type 2 diabetes, paroxysmal atrial fibrillation on Eliquis, GERD, history of CVA, anemia of chronic disease followed by hematology presents to the hospital with shortness of breath.  She was admitted for evaluation of acute on chronic diastolic heart failure and acute on stage IV CKD.  She received a dose of Lasix earlier this morning and her creatinine worsened from 2.45-3.  Nephrology consulted for recommendations.  Repeat chest x-ray this evening showed no edema but a right lower lobe opacity suspicious for airspace disease. She was started on IV Rocephin and Zithromax for community-acquired pneumonia.    assessment & Plan:   Principal Problem:   Acute exacerbation of CHF (congestive heart failure) (HCC) Active Problems:   Type 2 diabetes mellitus (HCC)   AKI (acute kidney injury) (HCC)   Atrial fibrillation (HCC)   Hypokalemia   Mild acute on chronic diastolic heart failure:  Improved, pt reports she had urinated more than 3.5 lit since last night.  CXR shows no vascular congestion or interstitial edema.  Will repeat BNP in am.  Echocardiogram in 2019 showed preserved left ventricular ejection fraction 60 to 65% with no regional wall abnormalities with grade 2 diastolic dysfunction.   Right lower lobe opacity suspicious for right lower lobe pneumonia Start the patient on IV Rocephin and Zithromax.  Get urine for streptococcal antigen.  Patient denies any fevers or chills.  She has occasional dry cough and some shortness of breath at rest.  No leukocytosis.   Type 2 diabetes mellitus CBG (last 3)  Recent Labs    02/20/19 0815 02/20/19 1115 02/20/19 1725  GLUCAP 137* 166* 166*    Suboptimally controlled CBGs.  Continue with sliding scale insulin while inpatient. Continue with Lantus 18 units daily.    Hyperlipidemia Continue with Crestor.   Chronic atrial fibrillation  Rate controlled and on Eliquis for anticoagulation.    Acute on stage IV CKD Baseline creatinine between 1.7-1.9 on admit patient's creatinine was 2.5 and it worsened to 1:03 dose of IV Lasix.  We will hold the Lasix as she does not appear to be fluid overloaded.  Get urine analysis and urine electrolytes and renal consulted for further recommendations.    Hypokalemia Replaced and get a magnesium levels.   Hypertension Suboptimally controlled, PRN hydralazine ordered.   Asthma No wheezing heard continue with Dulera and albuterol as needed.    DVT prophylaxis: Eliquis Code Status: Full code Family Communication: Husband at bedside  disposition Plan: Pending clinical improvement and nephrology evaluation  Consultants:   Nephrology  Procedures: None  Antimicrobials: Rocephin and Zithromax from 02/20/2019  Subjective: Patient reports her breathing has improved but is not back to baseline yet.  Objective: Vitals:   02/20/19 0400 02/20/19 0623 02/20/19 1045 02/20/19 1100  BP: (!) 136/46 (!) 147/54 (!) 151/58 139/69  Pulse: 60 62 65 62  Resp: 19 16 15 18   Temp:      TempSrc:      SpO2: 100% 93% 97% 95%  Weight:      Height:        Intake/Output Summary (Last 24 hours) at 02/20/2019 1306 Last data filed at 02/20/2019 0745 Gross per 24 hour  Intake -  Output 1400 ml  Net -1400 ml  Filed Weights   02/19/19 2141  Weight: 75 kg    Examination:  General exam: Appears calm and comfortable  Respiratory system: Diminished air entry at bases but without any wheezing or rhonchi. Cardiovascular system: S1 & S2 heard, RRR. No pedal edema. Gastrointestinal system: Abdomen is nondistended, soft and nontender. No organomegaly or masses felt. Normal bowel sounds heard.  Central nervous system: Alert and oriented. No focal neurological deficits. Extremities: Symmetric 5 x 5 power. Skin: No rashes, lesions or ulcers Psychiatry:  Mood & affect appropriate.     Data Reviewed: I have personally reviewed following labs and imaging studies  CBC: Recent Labs  Lab 02/19/19 1125 02/20/19 0440  WBC 7.3 5.8  HGB 8.6* 8.7*  HCT 27.8* 30.4*  MCV 94 102.4*  PLT 113* 941*   Basic Metabolic Panel: Recent Labs  Lab 02/19/19 1125 02/20/19 1141  NA 139 141  K 3.3* 3.4*  CL 104 103  CO2 30* 28  GLUCOSE 233* 172*  BUN 26 24*  CREATININE 2.55* 3.00*  CALCIUM 8.4* 8.5*  MG  --  2.1   GFR: Estimated Creatinine Clearance: 16.9 mL/min (A) (by C-G formula based on SCr of 3 mg/dL (H)). Liver Function Tests: Recent Labs  Lab 02/19/19 1125  AST 31  ALT 17  ALKPHOS 65  BILITOT 1.0  PROT 5.6*  ALBUMIN 2.9*   No results for input(s): LIPASE, AMYLASE in the last 168 hours. No results for input(s): AMMONIA in the last 168 hours. Coagulation Profile: No results for input(s): INR, PROTIME in the last 168 hours. Cardiac Enzymes: No results for input(s): CKTOTAL, CKMB, CKMBINDEX, TROPONINI in the last 168 hours. BNP (last 3 results) Recent Labs    09/25/18 1113 10/28/18 1041 02/19/19 1126  PROBNP 406.0* 364.0* 2,996*   HbA1C: No results for input(s): HGBA1C in the last 72 hours. CBG: Recent Labs  Lab 02/20/19 0815 02/20/19 1115  GLUCAP 137* 166*   Lipid Profile: No results for input(s): CHOL, HDL, LDLCALC, TRIG, CHOLHDL, LDLDIRECT in the last 72 hours. Thyroid Function Tests: No results for input(s): TSH, T4TOTAL, FREET4, T3FREE, THYROIDAB in the last 72 hours. Anemia Panel: No results for input(s): VITAMINB12, FOLATE, FERRITIN, TIBC, IRON, RETICCTPCT in the last 72 hours. Sepsis Labs: No results for input(s): PROCALCITON, LATICACIDVEN in the last 168 hours.  Recent Results (from the past 240 hour(s))  SARS CORONAVIRUS 2 (TAT 6-24 HRS)  Nasopharyngeal Nasopharyngeal Swab     Status: None   Collection Time: 02/19/19 10:07 PM   Specimen: Nasopharyngeal Swab  Result Value Ref Range Status   SARS Coronavirus 2 NEGATIVE NEGATIVE Final    Comment: (NOTE) SARS-CoV-2 target nucleic acids are NOT DETECTED. The SARS-CoV-2 RNA is generally detectable in upper and lower respiratory specimens during the acute phase of infection. Negative results do not preclude SARS-CoV-2 infection, do not rule out co-infections with other pathogens, and should not be used as the sole basis for treatment or other patient management decisions. Negative results must be combined with clinical observations, patient history, and epidemiological information. The expected result is Negative. Fact Sheet for Patients: SugarRoll.be Fact Sheet for Healthcare Providers: https://www.woods-mathews.com/ This test is not yet approved or cleared by the [place] FDA and  has been authorized for detection and/or diagnosis of SARS-CoV-2 by FDA under an Emergency Use Authorization (EUA). This EUA will remain  in effect (meaning this test can be used) for the duration of the COVID-19 declaration under Section 56 4(b)(1) of the Act, 21 U.S.C. section 360bbb-3(b)(1),  unless the authorization is terminated or revoked sooner. Performed at [facility], [place] [address] [place], [place] [address]          Radiology Studies: Dg Chest 2 View  Result Date: 02/19/2019 CLINICAL DATA:  Shortness of breath for 2 weeks EXAM: CHEST - 2 VIEW COMPARISON:  01/27/2019 FINDINGS: Cardiac shadow is stable in appearance. Postsurgical changes are again noted. Loop recorder is again seen. Slight increase in right-sided pleural effusion with underlying atelectasis is noted. IMPRESSION: Increasing right-sided pleural effusion with underlying atelectasis. Electronically Signed   By: Inez Catalina M.D.   On: 02/19/2019 12:24         Scheduled Meds: . apixaban  5 mg Oral BID  . exemestane  25 mg Oral Daily  . ezetimibe  10 mg Oral Daily  . insulin aspart  0-9 Units Subcutaneous TID WC  . insulin glargine  18 Units Subcutaneous Daily  . loratadine  10 mg Oral Daily  . metoprolol tartrate  25 mg Oral BID  . mometasone-formoterol  2 puff Inhalation BID  . rosuvastatin  40 mg Oral QHS  . zolpidem  5 mg Oral QHS   Continuous Infusions:   LOS: 1 day    Time spent:32 minutes     Hosie Poisson, MD [facility] Pager [phone]   If 7PM-7AM, please contact night-coverage [email or web address] Password [email or web address] 02/20/2019, 1:06 PM

## 2019-02-20 NOTE — ED Notes (Signed)
Pt placed on hospital bed at this time for comfort

## 2019-02-20 NOTE — ED Notes (Signed)
Lunch tray ordered 

## 2019-02-20 NOTE — Consult Note (Signed)
[facility]    NEPHROLOGY CONSULTATION NOTE  PATIENT ID:  Hannah [name], DOB:  05/20/1946  HPI: The patient is a 73 y.o. year old female with a PMHx significant for HFpEF, CAD s/p CABG, AVR, asthma, Stage IV CKD (baseline creatinine 1.5-2), PAF on eliquis, hyperlipidemia, DM II, GERD, CVA, and anemia who presents for acute on chronic CHF.  She was also noted to have community-acquired pneumonia.  She received a dose of furosemide on admission and her creatinine worsened to 3.  She reports significant LE edema that has improved, as well as DOE that has improved.  Renal consultation has been called for AKI/CKD IV.   Past Medical History:  Diagnosis Date  . Abnormally small mouth   . Allergic rhinitis 10/14/2009   Qualifier: Diagnosis of  By: Lamonte Sakai MD, Rose Fillers   Overview:  Overview:  Qualifier: Diagnosis of  By: Lamonte Sakai MD, Rose Fillers  Last Assessment & Plan:  Please continue Xyzal and Nasacort as you have been using them  . Anemia   . Asthma 05/12/2009   10/12/2014 p extensive coaching HFA effectiveness =    75% s spacer    Overview:  Overview:  10/12/2014 p extensive coaching HFA effectiveness =    75% s spacer   Last Assessment & Plan:  Please continue Symbicort 2 puffs twice a day. Remember to rinse and gargle after taking this medication. Take albuterol 2 puffs up to every 4 hours if needed for shortness of breath.  Follow with Dr Lamonte Sakai in 6 month  . Bilateral carotid artery stenosis    a. mild 1-39% by duplex 10/2017, due 2021.  . Breast cancer ([place]) 01/18/2016   right breast  . CAD (coronary artery disease) 01/24/2011   a. s/p CABG 1998 with redo 2009.  Marland Kitchen Chemotherapy induced nausea and vomiting 02/24/2016  . 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 Chronic diastolic CHF (congestive heart failure) ([place]) 09/24/2013  . CKD (chronic kidney disease), stage IV ([place]) 09/24/2013  . Closed fracture of head of left  humerus 09/09/2017  . Complication of anesthesia   . Essential hypertension 05/12/2009   Qualifier: Diagnosis of  By: Lamonte Sakai MD, Rose Fillers   . Gallstones   . GERD (gastroesophageal reflux disease)   . Glaucoma   . H/O atrial tachycardia 05/12/2009   Qualifier: History of  By: Lamonte Sakai MD, Rose Fillers   Overview:  Overview:  Qualifier: History of  By: Lamonte Sakai MD, Rose Fillers  Last Assessment & Plan:  There was no evidence of this on her cardiac monitor. I would recommend watchful waiting.   Marland Kitchen History of non-ST elevation myocardial infarction (NSTEMI)    Sept 2014--  thought to be type II HTN w/ LHC without infarct related artery and patent grafts  . History of radiation therapy 05/24/16-07/26/16   right breast 50.4 Gy in 28 fractions, right breast boost 10 Gy in 5 fractions  . Hyperlipidemia   . Hypertension    Resolved.  . Iron deficiency anemia   . Mania ([place]) 03/05/2016  . Moderate persistent asthma    pulmologist-  Dr. Malvin Johns  . Osteomyelitis of toe of left foot ([place]) 04/06/2016  . Osteopenia of multiple sites 10/19/2015  . PAF (paroxysmal atrial fibrillation) ([place])   . Personal history of chemotherapy 2017  . Personal history of radiation therapy 2017  . PONV (postoperative nausea and vomiting)   . Port catheter in place 02/17/2016  . Psoriasis  right leg  . Renal calculus, right   . S/P AVR    prosthesis valve placement 2009 at same time re-do CABG  . Seizure-like activity ([place]) 09/01/2017  . Sensorineural hearing loss (SNHL) of both ears 01/06/2016  . Stroke [facility])    residual rt hearing loss  . Syncope 09/06/2017  . Type 2 diabetes mellitus ([place])    monitored by dr Dwyane Dee    Past Surgical History:  Procedure Laterality Date  . AORTIC VALVE REPLACEMENT  2009   [facility] (done same time is CABG)  . BREAST LUMPECTOMY Right 01/18/2016  . BREAST LUMPECTOMY WITH RADIOACTIVE SEED AND SENTINEL LYMPH NODE BIOPSY Right 01/18/2016   Procedure: RIGHT BREAST LUMPECTOMY  WITH RADIOACTIVE SEED AND SENTINEL LYMPH NODE BIOPSY;  Surgeon: DAlphonsa Overall MD;  [facility]  Service: General;  Laterality: Right;  . CARDIAC CATHETERIZATION  03/23/2008   Pre-redo CABG: L main OK, LAD (T), CFX (T), OM1 99%, RCA (T), LIMA-LAD OK, SVG-OM(?3) OK w/ little florw to OM2, SVG-RCA OK. EF NL  . CARPAL TUNNEL RELEASE    . CHOLECYSTECTOMY N/A 07/07/2018   Procedure: LAPAROSCOPIC CHOLECYSTECTOMY WITH INTRAOPERATIVE CHOLANGIOGRAM ERAS PATHWAY;  Surgeon: NAlphonsa Overall MD;  Location: [facility];  Service: General;  Laterality: N/A;  . COLONOSCOPY     around 2015. Possibly with [facility]   . CORONARY ARTERY BYPASS GRAFT  1998 &  re-do 2009   Had LIMA to DX/LAD, SVG to 2 marginal branches [facility]; SVG to 3rd OM and PD at time of redo  . CYSTOSCOPY W/ URETERAL STENT PLACEMENT Right 12/20/2014   Procedure: CYSTOSCOPY WITH RETROGRADE PYELOGRAM/URETERAL STENT PLACEMENT;  Surgeon: PCleon Gustin MD;  [facility]  Service: Urology;  Laterality: Right;  . ESOPHAGOGASTRODUODENOSCOPY     many years ago per patient   . ESOPHAGOGASTRODUODENOSCOPY ENDOSCOPY  06/17/2018  . EYE SURGERY Bilateral    cataracts  . HOLMIUM LASER APPLICATION Right [id number]/[id number]  Procedure:  HOLMIUM LASER LITHOTRIPSY;  Surgeon: PCleon Gustin MD;  [facility]  Service: Urology;  Laterality: Right;  . IR THORACENTESIS ASP PLEURAL SPACE W/IMG GUIDE  10/03/2018  . LEFT HEART CATHETERIZATION WITH CORONARY/GRAFT ANGIOGRAM N/A 02/23/2013   Procedure: LEFT HEART CATHETERIZATION WITH CBeatrix Fetters  Surgeon: MBlane Ohara MD;  [facility];  Service: Cardiovascular;  Laterality: N/A;  . LOOP RECORDER INSERTION N/A 08/30/2017   Procedure: LOOP RECORDER INSERTION;  Surgeon: TEvans Lance MD;  [facility];  Service: Cardiovascular;  Laterality: N/A;  . PORTACATH PLACEMENT Left 01/18/2016   Procedure: INSERTION PORT-A-CATH;   Surgeon: DAlphonsa Overall MD;  [place] [facility]  Service: General;  Laterality: Left;  . portacath removal    . TONSILLECTOMY    . TRANSTHORACIC ECHOCARDIOGRAM  02-24-2013      mild LVH,  ef 50-55%/  AV bioprosthesis was present with very mild stenosis and no regurg., mean grandient 146mg, peak grandient 2027m /  mild MR/  mild LAE and RAE/  moderate TR  . TUBAL LIGATION      Family History  Problem Relation Age of Onset  . Heart disease Father   . Heart failure Father   . Diabetes Maternal Grandmother   . Heart disease Maternal Grandmother   . Diabetes Son   . Healthy Brother        #1  . Heart attack Brother        #  2  . Heart disease Brother        #2  . Colon cancer Neg Hx   . Esophageal cancer Neg Hx     Social History   Tobacco Use  . Smoking status: Never Smoker  . Smokeless tobacco: Never Used  Substance Use Topics  . Alcohol use: No  . Drug use: No    REVIEW OF SYSTEMS: General:  no fatigue, no weakness Head:  no headaches Eyes:  no blurred vision ENT:  no sore throat Neck:  no masses CV:  no chest pain, no orthopnea, (+) LE edema, (+) DOE Lungs:  (+)DOE, no cough GI:  no nausea or vomiting, no diarrhea GU:  no dysuria or hematuria Skin:  no rashes or lesions Neuro:  no focal numbness or weakness Psych:  no depression or anxiety    PHYSICAL EXAM:  Vitals:   02/20/19 1900 02/20/19 1915  BP: (!) 151/51 (!) 143/91  Pulse: 64 67  Resp: 19 (!) 21  Temp:    SpO2: 97% 98%   I/O last 3 completed shifts: In: 99.7 [IV Piggyback:99.7] Out: 1400 [Urine:1400]   General:  AAOx3 NAD HEENT: MMM [place] AT anicteric sclera Neck:  No JVD, no adenopathy CV:  Heart RRR  Lungs:  L/S CTA bilaterally Abd:  abd SNT/ND with normal BS GU:  Bladder non-palpable Extremities:  (+)1 bilateral LE edema Skin:  No skin rash Psych:  normal mood and affect Neuro:  no focal deficits   CURRENT MEDICATIONS:  . apixaban  5 mg Oral BID  . exemestane  25 mg Oral Daily  .  ezetimibe  10 mg Oral Daily  . insulin aspart  0-9 Units Subcutaneous TID WC  . insulin glargine  18 Units Subcutaneous Daily  . loratadine  10 mg Oral Daily  . metoprolol tartrate  25 mg Oral BID  . mometasone-formoterol  2 puff Inhalation BID  . rosuvastatin  40 mg Oral QHS  . zolpidem  5 mg Oral QHS     HOME MEDICATIONS:  Prior to Admission medications   Medication Sig Start Date End Date Taking? Authorizing Provider  acetaminophen (TYLENOL) 325 MG tablet Take 2 tablets (650 mg total) by mouth every 6 (six) hours as needed for mild pain (or Fever >/= 101). 09/03/17  Yes Vann, Jessica U, DO  albuterol (PROVENTIL HFA;VENTOLIN HFA) 108 (90 Base) MCG/ACT inhaler Inhale 2 puffs into the lungs every 6 (six) hours as needed for wheezing or shortness of breath.   Yes [provider]  amiodarone (PACERONE) 200 MG tablet Take 1 tablet (200 mg total) by mouth daily. 02/03/19  Yes Evans Lance, MD  budesonide-formoterol (SYMBICORT) 160-4.5 MCG/ACT inhaler INHALE TWO PUFFS INTO THE LUNGS TWICE DAILY Patient taking differently: Inhale 2 puffs into the lungs 2 (two) times daily.  09/18/18  Yes Byrum, Rose Fillers, MD  ELIQUIS 5 MG TABS tablet TAKE ONE TABLET BY MOUTH TWICE DAILY Patient taking differently: Take 5 mg by mouth 2 (two) times daily.  02/03/19  Yes Evans Lance, MD  epoetin alfa-epbx (RETACRIT) 16109 UNIT/ML injection Inject 40,000 Units into the skin every Friday.   Yes [provider]  exemestane (AROMASIN) 25 MG tablet TAKE ONE TABLET BY MOUTH DAILY AFTER BREAKFAST Patient taking differently: Take 25 mg by mouth daily after breakfast.  09/17/18  Yes Nicholas Lose, MD  ezetimibe (ZETIA) 10 MG tablet TAKE ONE TABLET BY MOUTH EVERY DAY 10/29/18  Yes Evans Lance, MD  famotidine (PEPCID) 20  MG tablet One after supper Patient taking differently: Take 20 mg by mouth daily after supper.  09/25/18  Yes Tanda Rockers, MD  insulin glargine (LANTUS) 100 UNIT/ML injection Inject 16  units under the skin once daily in the morning. Patient taking differently: Inject 18 Units into the skin daily after breakfast.  09/01/18  Yes Elayne Snare, MD  insulin lispro (HUMALOG) 100 UNIT/ML injection Inject 4-6 units under the skin three times daily before meals. Patient taking differently: Inject 4-8 Units into the skin 3 (three) times daily after meals.  09/04/18  Yes Elayne Snare, MD  loratadine (CLARITIN) 10 MG tablet Take 10 mg by mouth daily.   Yes [provider]  metoprolol tartrate (LOPRESSOR) 25 MG tablet Take 1 tablet (25 mg total) by mouth 2 (two) times daily. 12/24/18 03/24/19 Yes [name], NP  nitroGLYCERIN (NITROSTAT) 0.4 MG SL tablet Place 1 tablet (0.4 mg total) under the tongue every 5 (five) minutes as needed for chest pain. 09/19/18 02/19/19 Yes Evans Lance, MD  potassium chloride SA (K-DUR) 20 MEQ tablet Take 1 tablet (20 mEq total) by mouth daily. 01/19/19  Yes [name], NP  rosuvastatin (CRESTOR) 40 MG tablet Take 1 tablet (40 mg total) by mouth at bedtime. 09/08/18  Yes [name], NP  zolpidem (AMBIEN) 5 MG tablet TAKE ONE TABLET BY MOUTH AT BEDTIME AS NEEDED FOR SLEEP Patient taking differently: Take 5 mg by mouth at bedtime.  12/06/18  Yes Saguier, Percell Miller, PA-C  ACCU-CHEK GUIDE test strip Use to test blood sugar 3 times daily Patient taking differently: 1 each by Other route 3 (three) times daily.  10/21/18   Elayne Snare, MD  Blood Glucose Monitoring Suppl (ACCU-CHEK AVIVA PLUS) w/Device KIT Use accu chek aviva meter to check blood sugar three times daily. 02/13/19   Elayne Snare, MD  furosemide (LASIX) 20 MG tablet 40 mg once a week - 20 mg the other 6 days Patient taking differently: Take 20-40 mg by mouth See admin instructions. Take 20 mg by mouth once a day on Sun/Mon/Tues/Thurs/Fri/Sat and 40 mg on Wed (dosage to eventually change to 20 mg once a day, rotating with 40 mg every other day) 01/28/19   [name], NP  Insulin Pen Needle  32G X 4 MM MISC Use as instructed to inject insulin 4 times daily. 09/02/18   Elayne Snare, MD  midodrine (PROAMATINE) 2.5 MG tablet Take 1 tablet (2.5 mg total) by mouth 3 (three) times daily with meals. Patient not taking: Reported on 02/19/2019 02/19/19   Charlie Pitter, PA-C       LABS:  CBC Latest Ref Rng & Units 02/20/2019 02/19/2019 02/13/2019  WBC 4.0 - 10.5 K/uL 5.8 7.3 4.5  Hemoglobin 12.0 - 15.0 g/dL 8.7(L) 8.6(L) 8.3(L)  Hematocrit 36.0 - 46.0 % 30.4(L) 27.8(L) 28.7(L)  Platelets 150 - 400 K/uL 106(L) 113(L) 102(L)    CMP Latest Ref Rng & Units 02/20/2019 02/19/2019 02/09/2019  Glucose 70 - 99 mg/dL 172(H) 233(H) 82  BUN 8 - 23 mg/dL 24(H) 26 -  Creatinine 0.44 - 1.00 mg/dL 3.00(H) 2.55(H) -  Sodium 135 - 145 mmol/L 141 139 -  Potassium 3.5 - 5.1 mmol/L 3.4(L) 3.3(L) -  Chloride 98 - 111 mmol/L 103 104 -  CO2 22 - 32 mmol/L 28 30(H) -  Calcium 8.9 - 10.3 mg/dL 8.5(L) 8.4(L) -  Total Protein 6.0 - 8.5 g/dL - 5.6(L) -  Total Bilirubin 0.0 - 1.2 mg/dL -  1.0 -  Alkaline Phos 39 - 117 IU/L - 65 -  AST 0 - 40 IU/L - 31 -  ALT 0 - 32 IU/L - 17 -    Lab Results  Component Value Date   CALCIUM 8.5 (L) 02/20/2019   CAION 1.19 06/14/2013       Component Value Date/Time   COLORURINE YELLOW 05/21/2018 1736   APPEARANCEUR CLOUDY (A) 05/21/2018 1736   LABSPEC 1.023 05/21/2018 1736   LABSPEC 1.020 02/24/2016 1119   PHURINE 7.0 05/21/2018 1736   GLUCOSEU 50 (A) 05/21/2018 1736   GLUCOSEU Negative 02/24/2016 1119   HGBUR MODERATE (A) 05/21/2018 1736   BILIRUBINUR NEGATIVE 05/21/2018 1736   BILIRUBINUR Negative 02/24/2016 1119   KETONESUR NEGATIVE 05/21/2018 1736   PROTEINUR 30 (A) 05/21/2018 1736   UROBILINOGEN 0.2 02/24/2016 1119   NITRITE NEGATIVE 05/21/2018 1736   LEUKOCYTESUR LARGE (A) 05/21/2018 1736   LEUKOCYTESUR Trace 02/24/2016 1119      Component Value Date/Time   PHART 7.415 (H) 04/06/2008 0828   PCO2ART 44.1 04/06/2008 0828   PO2ART 98.0 04/06/2008 0828   HCO3  28.2 (H) 04/06/2008 0828   TCO2 25 06/14/2013 2257   ACIDBASEDEF 2.0 04/05/2008 1655   O2SAT 97.0 04/06/2008 0828       Component Value Date/Time   IRON 64 12/24/2018 1225   TIBC 205 (L) 12/24/2018 1225   FERRITIN 358 (H) 12/24/2018 1225   IRONPCTSAT 31 12/24/2018 1225   IRONPCTSAT 33 07/10/2016 1037       ASSESSMENT/PLAN:     1.  CKD stage IV.  Baseline creatinine 1.5-2.  Likely related to longstanding DM.  Check urine protein for completeness.  [place] without hydronephrosis.  2.  AKI.  Likely related to diuresis.  UA pending.  Will dose diuretics daily.  Agree with holding for now.  3.  Anemia of CKD.  Check iron studies.  4.  Hypertension.  Improved with diuresis.  Will monitor.  5.  Acute on chronic diastolic congestive heart failure.  Appears relatively euvolemic today.  We will hold off on further diuresis.  6.  Community-acquired pneumonia.  Agree with antibiotics.   [place], DO, [place]

## 2019-02-20 NOTE — Telephone Encounter (Signed)
Follow up    Pt saw Dayna yesterday.  She was told to go to the ER to have fluid drawn off of her lungs.  She is still in the holding room in the ER.  She has not seen a pulmonary doctor yet.  Patient want to go home.  Please advise.

## 2019-02-20 NOTE — Telephone Encounter (Signed)
° °  Patient called states she is still waiting in ED. She plans to leave the hospital shortly if Pulmonary team does not respond.

## 2019-02-20 NOTE — ED Notes (Signed)
Pt ambulatory to restroom

## 2019-02-20 NOTE — ED Notes (Signed)
Tele   Breakfast ordered  

## 2019-02-21 DIAGNOSIS — N183 Chronic kidney disease, stage 3 (moderate): Secondary | ICD-10-CM

## 2019-02-21 DIAGNOSIS — Z794 Long term (current) use of insulin: Secondary | ICD-10-CM

## 2019-02-21 DIAGNOSIS — E1122 Type 2 diabetes mellitus with diabetic chronic kidney disease: Secondary | ICD-10-CM

## 2019-02-21 DIAGNOSIS — E876 Hypokalemia: Secondary | ICD-10-CM

## 2019-02-21 LAB — URINALYSIS, ROUTINE W REFLEX MICROSCOPIC
Bacteria, UA: NONE SEEN
Bilirubin Urine: NEGATIVE
Glucose, UA: 150 mg/dL — AB
Ketones, ur: NEGATIVE mg/dL
Leukocytes,Ua: NEGATIVE
Nitrite: NEGATIVE
Protein, ur: 100 mg/dL — AB
Specific Gravity, Urine: 1.012 (ref 1.005–1.030)
pH: 8 (ref 5.0–8.0)

## 2019-02-21 LAB — PROTEIN / CREATININE RATIO, URINE
Creatinine, Urine: 52.7 mg/dL
Protein Creatinine Ratio: 2.35 mg/mg{Cre} — ABNORMAL HIGH (ref 0.00–0.15)
Total Protein, Urine: 124 mg/dL

## 2019-02-21 LAB — FERRITIN: Ferritin: 38 ng/mL (ref 11–307)

## 2019-02-21 LAB — IRON AND TIBC
Iron: 19 ug/dL — ABNORMAL LOW (ref 28–170)
Saturation Ratios: 7 % — ABNORMAL LOW (ref 10.4–31.8)
TIBC: 256 ug/dL (ref 250–450)
UIBC: 237 ug/dL

## 2019-02-21 LAB — BASIC METABOLIC PANEL
Anion gap: 9 (ref 5–15)
BUN: 29 mg/dL — ABNORMAL HIGH (ref 8–23)
CO2: 27 mmol/L (ref 22–32)
Calcium: 8.4 mg/dL — ABNORMAL LOW (ref 8.9–10.3)
Chloride: 105 mmol/L (ref 98–111)
Creatinine, Ser: 2.99 mg/dL — ABNORMAL HIGH (ref 0.44–1.00)
GFR calc Af Amer: 17 mL/min — ABNORMAL LOW (ref >60)
GFR calc non Af Amer: 15 mL/min — ABNORMAL LOW (ref >60)
Glucose, Bld: 153 mg/dL — ABNORMAL HIGH (ref 70–99)
Potassium: 3.4 mmol/L — ABNORMAL LOW (ref 3.5–5.1)
Sodium: 141 mmol/L (ref 135–145)

## 2019-02-21 LAB — GLUCOSE, CAPILLARY
Glucose-Capillary: 142 mg/dL — ABNORMAL HIGH (ref 70–99)
Glucose-Capillary: 154 mg/dL — ABNORMAL HIGH (ref 70–99)

## 2019-02-21 LAB — SODIUM, URINE, RANDOM: Sodium, Ur: 109 mmol/L

## 2019-02-21 LAB — CREATININE, URINE, RANDOM: Creatinine, Urine: 54.27 mg/dL

## 2019-02-21 MED ORDER — FERROUS SULFATE 325 (65 FE) MG PO TBEC: 325.0000 mg | DELAYED_RELEASE_TABLET | Freq: Two times a day (BID) | ORAL | 2 refills | Status: DC

## 2019-02-21 MED ORDER — POTASSIUM CHLORIDE CRYS ER 20 MEQ PO TBCR
40.0000 meq | EXTENDED_RELEASE_TABLET | Freq: Once | ORAL | Status: AC
  Administered 2019-02-21: 40 meq via ORAL
  Filled 2019-02-21: qty 2

## 2019-02-21 MED ORDER — LEVOFLOXACIN 250 MG PO TABS: 250.0000 mg | ORAL_TABLET | ORAL | 0 refills | Status: AC

## 2019-02-21 MED ORDER — FUROSEMIDE 40 MG PO TABS: 40.0000 mg | ORAL_TABLET | Freq: Every day | ORAL | 11 refills | Status: DC

## 2019-02-21 NOTE — Discharge Summary (Signed)
Physician Discharge Summary  Hannah [name] WUJ:[id number] DOB: 10-10-1945 DOA: 02/19/2019  PCP: Mackie Pai, PA-C  Admit date: 02/19/2019 Discharge date: 02/21/2019  Ad[facility] From:[facility].  Disposition:  Home.   Recommendations for Outpatient Follow-up:  1. Follow up with PCP in 1-2 weeks 2. Please obtain BMP/CBC in one week Please follow up with Dr Posey Pronto in 2 weeks or sooner if needed.  Please obtain a CXR to evaluate for resolution of the pneumonia.    Discharge Condition:stable.  CODE STATUS: full code.  Diet recommendation: Heart Healthy  Brief/Interim Summary: 73 year old lady with prior history of chronic diastolic heart failure, coronary artery status post CABG, AVR, asthma, stage IV CKD, hypertension, hyperlipidemia type 2 diabetes, paroxysmal atrial fibrillation on Eliquis, GERD, history of CVA, anemia of chronic disease followed by hematology presents to the hospital with shortness of breath.  She was admitted for evaluation of acute on chronic diastolic heart failure and acute on stage IV CKD.  She received a dose of Lasix earlier this morning and her creatinine worsened from 2.45-3.  Nephrology consulted for recommendations.  Repeat chest x-ray this evening showed no edema but a right lower lobe opacity suspicious for airspace disease. She was started on IV Rocephin and Zithromax for community-acquired pneumonia her breathing has improved.  Nephrology consulted for Acute on STAGE 4 CKD. Recommended to continue with lasix 40 mg daily.    Discharge Diagnoses:  Principal Problem:   Acute exacerbation of CHF (congestive heart failure) (HCC) Active Problems:   Type 2 diabetes mellitus (HCC)   AKI (acute kidney injury) (HCC)   Atrial fibrillation (HCC)   Hypokalemia  Mild acute on chronic diastolic heart failure:  Improved, pt reports she had urinated more than 3.5 lit since last night.  CXR shows no vascular congestion or interstitial edema.  Will repeat BNP in am.   Echocardiogram in 2019 showed preserved left ventricular ejection fraction 60 to 65% with no regional wall abnormalities with grade 2 diastolic dysfunction.   Right lower lobe opacity suspicious for right lower lobe pneumonia Start the patient on IV Rocephin and Zithromax.  Patient denies any fevers or chills. She currently denies any chest pain, sob or cough. She will be discharged on oral levaquin to complete the course.  Recommend outpatient follow up with a repeat CXR in 2 to 4 weeks for resolution of the pneumonia.   Type 2 diabetes mellitus Resume home meds on discharge.     Hyperlipidemia Continue with Crestor.   Chronic atrial fibrillation  Rate controlled and on Eliquis for anticoagulation.    Acute on stage IV CKD Sec to progression of diabetic nephropathy and diuresis with IV lasix.  Baseline creatinine between 1.7-1.9 on admit patient's creatinine was 2.5 and it worsened to 3 . Nephrology consulted and recommendations to continue with lasix 40 mg daily .  Follow up with Dr Posey Pronto in 2 weeks.  US renal does not show any hydronephrosis.      Hypokalemia Replaced    Hypertension Better controlled this am.    Asthma No wheezing heard continue with Dulera and albuterol as needed.    Discharge Instructions  Discharge Instructions    Diet - low sodium heart healthy   Complete by: As directed    Discharge instructions   Complete by: As directed    Please follow up with Dr Posey Pronto in 2 weeks.  Please check a repeat CXR in 2 to 4 weeks to evaluate for resolution of the pneumonia.     Allergies  as of 02/21/2019      Reactions   Amoxicillin Rash, Other (See Comments)   Tolerates Cephalosporins Has patient had a PCN reaction causing immediate rash, facial/tongue/throat swelling, SOB or lightheadedness with hypotension: Yes Has patient had a PCN reaction causing severe rash involving mucus membranes or skin necrosis: Yes Has patient had a PCN  reaction that required hospitalization No Has patient had a PCN reaction occurring within the last 10 years: No If all of the above answers are "NO", then may proceed with Cephalosporin use.   Tape Other (See Comments)   Must use paper tape   Aldactone [spironolactone] Other (See Comments)   CKD/hypokalemia   Imdur [isosorbide Dinitrate] Other (See Comments)   Headache/severe hypotension/Syncope   Arimidex [anastrozole] Nausea Only   Latex Itching, Other (See Comments)   (Dentist office)   Tetracycline Rash      Medication List    STOP taking these medications   midodrine 2.5 MG tablet Commonly known as: PROAMATINE     TAKE these medications   Accu-Chek Aviva Plus w/Device Kit Use accu chek aviva meter to check blood sugar three times daily.   Accu-Chek Guide test strip Generic drug: glucose blood Use to test blood sugar 3 times daily What changed: See the new instructions.   acetaminophen 325 MG tablet Commonly known as: TYLENOL Take 2 tablets (650 mg total) by mouth every 6 (six) hours as needed for mild pain (or Fever >/= 101).   albuterol 108 (90 Base) MCG/ACT inhaler Commonly known as: VENTOLIN HFA Inhale 2 puffs into the lungs every 6 (six) hours as needed for wheezing or shortness of breath.   amiodarone 200 MG tablet Commonly known as: PACERONE Take 1 tablet (200 mg total) by mouth daily.   budesonide-formoterol 160-4.5 MCG/ACT inhaler Commonly known as: Symbicort INHALE TWO PUFFS INTO THE LUNGS TWICE DAILY What changed:   how much to take  how to take this  when to take this  additional instructions   Eliquis 5 MG Tabs tablet Generic drug: apixaban TAKE ONE TABLET BY MOUTH TWICE DAILY What changed: how much to take   epoetin alfa-epbx 40000 UNIT/ML injection Commonly known as: RETACRIT Inject 40,000 Units into the skin every Friday.   exemestane 25 MG tablet Commonly known as: AROMASIN TAKE ONE TABLET BY MOUTH DAILY AFTER BREAKFAST What  changed: See the new instructions.   ezetimibe 10 MG tablet Commonly known as: ZETIA TAKE ONE TABLET BY MOUTH EVERY DAY   famotidine 20 MG tablet Commonly known as: Pepcid One after supper What changed:   how much to take  how to take this  when to take this  additional instructions   ferrous sulfate 325 (65 FE) MG EC tablet Take 1 tablet (325 mg total) by mouth 2 (two) times daily.   furosemide 40 MG tablet Commonly known as: Lasix Take 1 tablet (40 mg total) by mouth daily. What changed:   medication strength  how much to take  how to take this  when to take this  additional instructions   insulin glargine 100 UNIT/ML injection Commonly known as: LANTUS Inject 16 units under the skin once daily in the morning. What changed:   how much to take  how to take this  when to take this  additional instructions   insulin lispro 100 UNIT/ML injection Commonly known as: HUMALOG Inject 4-6 units under the skin three times daily before meals. What changed:   how much to take  how to take  this  when to take this  additional instructions   Insulin Pen Needle 32G X 4 MM Misc Use as instructed to inject insulin 4 times daily.   levofloxacin 250 MG tablet Commonly known as: Levaquin Take 1 tablet (250 mg total) by mouth every other day for 4 days.   loratadine 10 MG tablet Commonly known as: CLARITIN Take 10 mg by mouth daily.   metoprolol tartrate 25 MG tablet Commonly known as: LOPRESSOR Take 1 tablet (25 mg total) by mouth 2 (two) times daily.   nitroGLYCERIN 0.4 MG SL tablet Commonly known as: NITROSTAT Place 1 tablet (0.4 mg total) under the tongue every 5 (five) minutes as needed for chest pain.   potassium chloride SA 20 MEQ tablet Commonly known as: K-DUR Take 1 tablet (20 mEq total) by mouth daily.   rosuvastatin 40 MG tablet Commonly known as: CRESTOR Take 1 tablet (40 mg total) by mouth at bedtime.   zolpidem 5 MG tablet Commonly  known as: AMBIEN TAKE ONE TABLET BY MOUTH AT BEDTIME AS NEEDED FOR SLEEP What changed:   when to take this  additional instructions      Follow-up Information    Saguier, Iris Pert. Schedule an appointment as soon as possible for a visit in 1 week(s).   Specialties: Internal Medicine, Family Medicine Contact information: [place] [address] [phone]        [name], NP .   Specialties: Nurse Practitioner, Interventional Cardiology, Cardiology, Radiology Contact information: [address] [place] [address] [phone]        Evans Lance, MD .   Specialty: Cardiology Contact information: [phone] [place] [address] [phone]        Elmarie Shiley, MD. Schedule an appointment as soon as possible for a visit in 2 week(s).   Specialty: Nephrology Contact information: [place] [address] [phone]          Allergies  Allergen Reactions  . Amoxicillin Rash and Other (See Comments)    Tolerates Cephalosporins Has patient had a PCN reaction causing immediate rash, facial/tongue/throat swelling, SOB or lightheadedness with hypotension: Yes Has patient had a PCN reaction causing severe rash involving mucus membranes or skin necrosis: Yes Has patient had a PCN reaction that required hospitalization No Has patient had a PCN reaction occurring within the last 10 years: No If all of the above answers are "NO", then may proceed with Cephalosporin use.   . Tape Other (See Comments)    Must use paper tape  . Aldactone [Spironolactone] Other (See Comments)    CKD/hypokalemia  . Imdur [Isosorbide Dinitrate] Other (See Comments)    Headache/severe hypotension/Syncope  . Arimidex [Anastrozole] Nausea Only  . Latex Itching and Other (See Comments)    (Dentist office)  . Tetracycline Rash    Consultations:  Nephrology.    Procedures/Studies: Dg Chest 2  View  Result Date: 02/20/2019 CLINICAL DATA:  Pleural effusion follow-up EXAM: CHEST - 2 VIEW COMPARISON:  02/19/2019 FINDINGS: Small right effusion and right lower lobe airspace disease unchanged Cardiac enlargement without heart failure or edema. Cardiac loop recorder. CABG. Left lung clear. IMPRESSION: Small right effusion and right lower lobe airspace disease unchanged. Negative for edema. Electronically Signed   By: Franchot Gallo M.D.   On: 02/20/2019 15:39   Dg Chest 2 View  Result Date: 02/19/2019 CLINICAL DATA:  Shortness of breath for 2 weeks EXAM:  CHEST - 2 VIEW COMPARISON:  01/27/2019 FINDINGS: Cardiac shadow is stable in appearance. Postsurgical changes are again noted. Loop recorder is again seen. Slight increase in right-sided pleural effusion with underlying atelectasis is noted. IMPRESSION: Increasing right-sided pleural effusion with underlying atelectasis. Electronically Signed   By: Inez Catalina M.D.   On: 02/19/2019 12:24   Dg Chest 2 View  Result Date: 01/27/2019 CLINICAL DATA:  Follow-up right pleural effusion EXAM: CHEST - 2 VIEW COMPARISON:  01/19/2019 FINDINGS: prior CABG. Heart is normal size. Small right pleural effusion again noted, unchanged. No confluent opacities. No acute bony abnormality. IMPRESSION: Stable small right pleural effusion. Electronically Signed   By: Rolm Baptise M.D.   On: 01/27/2019 10:41   US Abdomen Complete  Result Date: 02/17/2019 CLINICAL DATA:  Anemia EXAM: ABDOMEN ULTRASOUND COMPLETE COMPARISON:  06/10/2018 CT, chest x-ray from 01/27/2019 FINDINGS: Gallbladder: Surgically removed Common bile duct: Diameter: 4.4 mm. Liver: Mild increased echogenicity is noted without focal mass. This may be related to fatty infiltration or underlying hepatocellular disease. Portal vein is patent on color Doppler imaging with normal direction of blood flow towards the liver. IVC: No abnormality visualized. Pancreas: Visualized portion unremarkable. Spleen: Size and  appearance within normal limits. Right Kidney: Length: 9.4 cm. Echogenicity within normal limits. No mass or hydronephrosis visualized. Left Kidney: Length: 0.6 cm. Echogenicity within normal limits. No mass or hydronephrosis visualized. Abdominal aorta: No aneurysm visualized. Other findings: There is a somewhat lobulated fluid collection identified along the inferior aspect of the liver. This measures 2.6 x 3.4 x 4.3 cm. This was not seen on the prior CT examination and may be related to a focal fluid collection from prior cholecystectomy. Mild perihepatic ascites is noted. Note is made of right-sided pleural effusion. IMPRESSION: Status post cholecystectomy. There is a lobulated cystic area as described above along the inferior right hepatic margin. This could be related to the prior surgery possibly representing a small seroma. CT may be helpful for further evaluation. Small right pleural effusion. Mild ascites. Increased echogenicity within the liver likely related to fatty infiltration or underlying hepatocellular disease. Electronically Signed   By: Inez Catalina M.D.   On: 02/17/2019 10:18   US Breast Ltd Uni Right Inc Axilla  Result Date: 02/09/2019 CLINICAL DATA:  Patient with history of right breast cancer status post lumpectomy 2017. EXAM: DIGITAL DIAGNOSTIC BILATERAL MAMMOGRAM WITH CAD AND TOMO ULTRASOUND RIGHT BREAST COMPARISON:  Previous exam(s). ACR Breast Density Category c: The breast tissue is heterogeneously dense, which may obscure small masses. FINDINGS: Lumpectomy changes demonstrated within the right breast. Along the posterior outer aspect of the right breast deep to the scar there is a new asymmetry. This was further evaluated with additional views including exaggerated CC lateral and true lateral tomosynthesis images. No additional concerning masses, calcifications or nonsurgical distortion identified within either breast. Mammographic images were processed with CAD. On physical exam,  there is discoloration along the posterior aspect of the scar site involving the lower outer right breast. There are 2 focal areas of skin dimpling involving the outer aspect of the right axilla. Targeted ultrasound is performed, showing a 2.8 x 1.2 x 3.2 cm irregular hypoechoic mass with internal vascularity right breast 8 o'clock position 10 cm from nipple along the posterior aspect of the scar at the site of overlying cutaneous discoloration. There is no right axillary lymph node identified. Within the outer aspect of the right axilla there is a 2.4 x 0.9 x 1.5 cm irregular hypoechoic mass.  Adjacent to this is a 0.5 x 0.6 x 0.7 cm irregular hypoechoic mass. These masses are just deep to the areas of skin dimpling within the outer aspect of the right axilla. IMPRESSION: 1. New irregular hypoechoic mass along the posterior margin of the scar right breast 8 o'clock position. Findings are nonspecific however concerning for the possibility of localized recurrence. 2. Within the outer aspect of the right axilla there are two adjacent irregular hypoechoic masses just deep to the skin which are nonspecific. These may represent focal fat necrosis or potentially recurrent or metastatic disease. RECOMMENDATION: 1. Ultrasound-guided core needle biopsy right breast mass 8 o'clock position just deep to the scar. 2. Ultrasound-guided core needle biopsy of both irregular hypoechoic masses within the outer aspect of the right axilla. I have discussed the findings and recommendations with the patient. Results were also provided in writing at the conclusion of the visit. If applicable, a reminder letter will be sent to the patient regarding the next appointment. BI-RADS CATEGORY  4: Suspicious. Electronically Signed   By: Lovey Newcomer M.D.   On: 02/09/2019 11:19   Mm Diag Breast Tomo Bilateral  Result Date: 02/09/2019 CLINICAL DATA:  Patient with history of right breast cancer status post lumpectomy 2017. EXAM: DIGITAL  DIAGNOSTIC BILATERAL MAMMOGRAM WITH CAD AND TOMO ULTRASOUND RIGHT BREAST COMPARISON:  Previous exam(s). ACR Breast Density Category c: The breast tissue is heterogeneously dense, which may obscure small masses. FINDINGS: Lumpectomy changes demonstrated within the right breast. Along the posterior outer aspect of the right breast deep to the scar there is a new asymmetry. This was further evaluated with additional views including exaggerated CC lateral and true lateral tomosynthesis images. No additional concerning masses, calcifications or nonsurgical distortion identified within either breast. Mammographic images were processed with CAD. On physical exam, there is discoloration along the posterior aspect of the scar site involving the lower outer right breast. There are 2 focal areas of skin dimpling involving the outer aspect of the right axilla. Targeted ultrasound is performed, showing a 2.8 x 1.2 x 3.2 cm irregular hypoechoic mass with internal vascularity right breast 8 o'clock position 10 cm from nipple along the posterior aspect of the scar at the site of overlying cutaneous discoloration. There is no right axillary lymph node identified. Within the outer aspect of the right axilla there is a 2.4 x 0.9 x 1.5 cm irregular hypoechoic mass. Adjacent to this is a 0.5 x 0.6 x 0.7 cm irregular hypoechoic mass. These masses are just deep to the areas of skin dimpling within the outer aspect of the right axilla. IMPRESSION: 1. New irregular hypoechoic mass along the posterior margin of the scar right breast 8 o'clock position. Findings are nonspecific however concerning for the possibility of localized recurrence. 2. Within the outer aspect of the right axilla there are two adjacent irregular hypoechoic masses just deep to the skin which are nonspecific. These may represent focal fat necrosis or potentially recurrent or metastatic disease. RECOMMENDATION: 1. Ultrasound-guided core needle biopsy right breast mass 8  o'clock position just deep to the scar. 2. Ultrasound-guided core needle biopsy of both irregular hypoechoic masses within the outer aspect of the right axilla. I have discussed the findings and recommendations with the patient. Results were also provided in writing at the conclusion of the visit. If applicable, a reminder letter will be sent to the patient regarding the next appointment. BI-RADS CATEGORY  4: Suspicious. Electronically Signed   By: Lovey Newcomer M.D.   On:  02/09/2019 11:19   [place] Axillary Node Core Biopsy Right  Addendum Date: 02/17/2019   ADDENDUM REPORT: 02/17/2019 08:14 ADDENDUM: Pathology revealed FAT NECROSIS WITH ASSOCIATED FIBROSIS of the RIGHT breast, 8 o'clock. This was found to be concordant by Dr. Everlean Alstrom. Pathology revealed FAT NECROSIS WITH ASSOCIATED FIBROSIS of the RIGHT axilla, smaller mass. This was found to be concordant by Dr. Everlean Alstrom. Pathology revealed FAT NECROSIS WITH ASSOCIATED FIBROSIS of the RIGHT axilla, larger mass. This was found to be concordant by Dr. Everlean Alstrom. Pathology results were discussed with the patient by telephone. The patient reported doing well after the biopsies with tenderness at the sites. Post biopsy instructions and care were reviewed and questions were answered. The patient was encouraged to call [facility] for any additional concerns. The patient was instructed to return for annual diagnostic mammography and informed a reminder notice would be sent regarding this appointment. Pathology results reported by Stacie Acres, RN on 02/17/2019. Electronically Signed   By: Everlean Alstrom M.D.   On: 02/17/2019 08:14   Result Date: 02/17/2019 CLINICAL DATA:  73 year old female with history of right breast lumpectomy 2017 presents for ultrasound guided core biopsies of irregular mass along the posterior margin of the scar in the right breast at the 8 o'clock position, and biopsies of 2 adjacent irregular  hypoechoic masses in the right axilla. EXAM: ULTRASOUND GUIDED RIGHT BREAST CORE NEEDLE BIOPSY ULTRASOUND-GUIDED RIGHT AXILLARY CORE NEEDLE BIOPSY X 2 COMPARISON:  Previous exam(s). FINDINGS: I met with the patient and we discussed the procedure of ultrasound-guided biopsy, including benefits and alternatives. We discussed the high likelihood of a successful procedure. We discussed the risks of the procedure, including infection, bleeding, tissue injury, clip migration, and inadequate sampling. Informed written consent was given. The usual time-out protocol was performed immediately prior to the procedure. SITE 1: RIGHT BREAST 8 O'CLOCK: MASS: Lesion quadrant: LOWER OUTER Using sterile technique and 1% Lidocaine as local anesthetic, under direct ultrasound visualization, a 14 gauge spring-loaded device was used to perform biopsy of the mass in the right breast at the 8 o'clock position using a lateral to medial approach. At the conclusion of the procedure a ribbon shaped tissue marker clip was deployed into the biopsy cavity. Follow up 2 view mammogram was performed and dictated separately. SITE 2: RIGHT AXILLA SMALLER MASS: Lesion quadrant: UPPER-OUTER Using sterile technique and 1% Lidocaine as local anesthetic, under direct ultrasound visualization, a 14 gauge spring-loaded device was used to perform biopsy of the smaller superficial mass in the right axilla using a lateral to medial approach. At the conclusion of the procedure a spiral shaped HydroMARK tissue marker clip was deployed into the biopsy cavity. Follow up 2 view mammogram was performed and dictated separately. SITE 3: RIGHT AXILLA LARGER MASS: Lesion quadrant: UPPER-OUTER Using sterile technique and 1% Lidocaine as local anesthetic, under direct ultrasound visualization, a 14 gauge spring-loaded device was used to perform biopsy of the larger superficial mass in the right axilla using a lateral to medial approach. At the conclusion of the procedure a  spiral shaped HydroMARK tissue marker clip was deployed into the biopsy cavity. Follow up 2 view mammogram was performed and dictated separately. IMPRESSION: 1. Ultrasound-guided biopsy of the mass in the right breast at the 8 o'clock position, at site of ribbon shaped biopsy marking clip. 2. Ultrasound-guided biopsy of the smaller superficial mass in the right axilla, at site of [facility] biopsy marking clip. 3. Ultrasound-guided biopsy of the larger  superficial mass in the right axilla, at site of [facility] biopsy marking clip. Electronically Signed: By: Everlean Alstrom M.D. On: 02/12/2019 15:00   [place] Axillary Node Core Biopsy Right  Addendum Date: 02/17/2019   ADDENDUM REPORT: 02/17/2019 08:14 ADDENDUM: Pathology revealed FAT NECROSIS WITH ASSOCIATED FIBROSIS of the RIGHT breast, 8 o'clock. This was found to be concordant by Dr. Everlean Alstrom. Pathology revealed FAT NECROSIS WITH ASSOCIATED FIBROSIS of the RIGHT axilla, smaller mass. This was found to be concordant by Dr. Everlean Alstrom. Pathology revealed FAT NECROSIS WITH ASSOCIATED FIBROSIS of the RIGHT axilla, larger mass. This was found to be concordant by Dr. Everlean Alstrom. Pathology results were discussed with the patient by telephone. The patient reported doing well after the biopsies with tenderness at the sites. Post biopsy instructions and care were reviewed and questions were answered. The patient was encouraged to call [facility] for any additional concerns. The patient was instructed to return for annual diagnostic mammography and informed a reminder notice would be sent regarding this appointment. Pathology results reported by Stacie Acres, RN on 02/17/2019. Electronically Signed   By: Everlean Alstrom M.D.   On: 02/17/2019 08:14   Result Date: 02/17/2019 CLINICAL DATA:  73 year old female with history of right breast lumpectomy 2017 presents for ultrasound guided core biopsies of irregular mass along the  posterior margin of the scar in the right breast at the 8 o'clock position, and biopsies of 2 adjacent irregular hypoechoic masses in the right axilla. EXAM: ULTRASOUND GUIDED RIGHT BREAST CORE NEEDLE BIOPSY ULTRASOUND-GUIDED RIGHT AXILLARY CORE NEEDLE BIOPSY X 2 COMPARISON:  Previous exam(s). FINDINGS: I met with the patient and we discussed the procedure of ultrasound-guided biopsy, including benefits and alternatives. We discussed the high likelihood of a successful procedure. We discussed the risks of the procedure, including infection, bleeding, tissue injury, clip migration, and inadequate sampling. Informed written consent was given. The usual time-out protocol was performed immediately prior to the procedure. SITE 1: RIGHT BREAST 8 O'CLOCK: MASS: Lesion quadrant: LOWER OUTER Using sterile technique and 1% Lidocaine as local anesthetic, under direct ultrasound visualization, a 14 gauge spring-loaded device was used to perform biopsy of the mass in the right breast at the 8 o'clock position using a lateral to medial approach. At the conclusion of the procedure a ribbon shaped tissue marker clip was deployed into the biopsy cavity. Follow up 2 view mammogram was performed and dictated separately. SITE 2: RIGHT AXILLA SMALLER MASS: Lesion quadrant: UPPER-OUTER Using sterile technique and 1% Lidocaine as local anesthetic, under direct ultrasound visualization, a 14 gauge spring-loaded device was used to perform biopsy of the smaller superficial mass in the right axilla using a lateral to medial approach. At the conclusion of the procedure a spiral shaped HydroMARK tissue marker clip was deployed into the biopsy cavity. Follow up 2 view mammogram was performed and dictated separately. SITE 3: RIGHT AXILLA LARGER MASS: Lesion quadrant: UPPER-OUTER Using sterile technique and 1% Lidocaine as local anesthetic, under direct ultrasound visualization, a 14 gauge spring-loaded device was used to perform biopsy of the  larger superficial mass in the right axilla using a lateral to medial approach. At the conclusion of the procedure a spiral shaped HydroMARK tissue marker clip was deployed into the biopsy cavity. Follow up 2 view mammogram was performed and dictated separately. IMPRESSION: 1. Ultrasound-guided biopsy of the mass in the right breast at the 8 o'clock position, at site of ribbon shaped biopsy marking clip. 2. Ultrasound-guided biopsy of  the smaller superficial mass in the right axilla, at site of [facility] biopsy marking clip. 3. Ultrasound-guided biopsy of the larger superficial mass in the right axilla, at site of [facility] biopsy marking clip. Electronically Signed: By: Everlean Alstrom M.D. On: 02/12/2019 15:00   Mm Clip Placement Right  Result Date: 02/12/2019 CLINICAL DATA:  Post ultrasound-guided core biopsy of a mass in the right breast at the 8 o'clock position, ultrasound-guided core biopsy of the smaller mass in the right axilla and ultrasound-guided core biopsy of the larger mass in the right axilla. EXAM: DIAGNOSTIC RIGHT MAMMOGRAM POST ULTRASOUND BIOPSY COMPARISON:  Previous exam(s). FINDINGS: Mammographic images were obtained following ultrasound guided biopsy of a mass in the right breast at the 8 o'clock position, ultrasound-guided biopsy of the smaller mass in the right axilla and ultrasound-guided biopsy of the larger mass in the right axilla. A ribbon shaped biopsy marking clip is present at the site of the biopsied mass in the right breast at the 8 o'clock position, seen on the ML view only. A HydroMARK biopsy marking clip is present at the site of 1 of the biopsied masses in the right axilla, with the other HydroMARK biopsy marking clip not included in the field of view. IMPRESSION: 1. Ribbon shaped biopsy marking clip at site of biopsied mass in the right breast at the 8 o'clock position, visualized on the ML view only. 2. HydroMARK biopsy marking clip at site of 1 of the biopsied masses in  the right axilla, with the additional HydroMARK biopsy marking clip not included in the field of view. Final Assessment: Post Procedure Mammograms for Marker Placement Electronically Signed   By: Everlean Alstrom M.D.   On: 02/12/2019 15:09   [place] Rt Breast Bx W Loc Dev 1st Lesion Img Bx Spec US Guide  Addendum Date: 02/17/2019   ADDENDUM REPORT: 02/17/2019 08:14 ADDENDUM: Pathology revealed FAT NECROSIS WITH ASSOCIATED FIBROSIS of the RIGHT breast, 8 o'clock. This was found to be concordant by Dr. Everlean Alstrom. Pathology revealed FAT NECROSIS WITH ASSOCIATED FIBROSIS of the RIGHT axilla, smaller mass. This was found to be concordant by Dr. Everlean Alstrom. Pathology revealed FAT NECROSIS WITH ASSOCIATED FIBROSIS of the RIGHT axilla, larger mass. This was found to be concordant by Dr. Everlean Alstrom. Pathology results were discussed with the patient by telephone. The patient reported doing well after the biopsies with tenderness at the sites. Post biopsy instructions and care were reviewed and questions were answered. The patient was encouraged to call [facility] for any additional concerns. The patient was instructed to return for annual diagnostic mammography and informed a reminder notice would be sent regarding this appointment. Pathology results reported by Stacie Acres, RN on 02/17/2019. Electronically Signed   By: Everlean Alstrom M.D.   On: 02/17/2019 08:14   Result Date: 02/17/2019 CLINICAL DATA:  73 year old female with history of right breast lumpectomy 2017 presents for ultrasound guided core biopsies of irregular mass along the posterior margin of the scar in the right breast at the 8 o'clock position, and biopsies of 2 adjacent irregular hypoechoic masses in the right axilla. EXAM: ULTRASOUND GUIDED RIGHT BREAST CORE NEEDLE BIOPSY ULTRASOUND-GUIDED RIGHT AXILLARY CORE NEEDLE BIOPSY X 2 COMPARISON:  Previous exam(s). FINDINGS: I met with the patient and we discussed the  procedure of ultrasound-guided biopsy, including benefits and alternatives. We discussed the high likelihood of a successful procedure. We discussed the risks of the procedure, including infection, bleeding, tissue injury, clip migration, and  inadequate sampling. Informed written consent was given. The usual time-out protocol was performed immediately prior to the procedure. SITE 1: RIGHT BREAST 8 O'CLOCK: MASS: Lesion quadrant: LOWER OUTER Using sterile technique and 1% Lidocaine as local anesthetic, under direct ultrasound visualization, a 14 gauge spring-loaded device was used to perform biopsy of the mass in the right breast at the 8 o'clock position using a lateral to medial approach. At the conclusion of the procedure a ribbon shaped tissue marker clip was deployed into the biopsy cavity. Follow up 2 view mammogram was performed and dictated separately. SITE 2: RIGHT AXILLA SMALLER MASS: Lesion quadrant: UPPER-OUTER Using sterile technique and 1% Lidocaine as local anesthetic, under direct ultrasound visualization, a 14 gauge spring-loaded device was used to perform biopsy of the smaller superficial mass in the right axilla using a lateral to medial approach. At the conclusion of the procedure a spiral shaped HydroMARK tissue marker clip was deployed into the biopsy cavity. Follow up 2 view mammogram was performed and dictated separately. SITE 3: RIGHT AXILLA LARGER MASS: Lesion quadrant: UPPER-OUTER Using sterile technique and 1% Lidocaine as local anesthetic, under direct ultrasound visualization, a 14 gauge spring-loaded device was used to perform biopsy of the larger superficial mass in the right axilla using a lateral to medial approach. At the conclusion of the procedure a spiral shaped HydroMARK tissue marker clip was deployed into the biopsy cavity. Follow up 2 view mammogram was performed and dictated separately. IMPRESSION: 1. Ultrasound-guided biopsy of the mass in the right breast at the 8 o'clock  position, at site of ribbon shaped biopsy marking clip. 2. Ultrasound-guided biopsy of the smaller superficial mass in the right axilla, at site of [facility] biopsy marking clip. 3. Ultrasound-guided biopsy of the larger superficial mass in the right axilla, at site of [facility] biopsy marking clip. Electronically Signed: By: Everlean Alstrom M.D. On: 02/12/2019 15:00       Subjective: No new complaints.   Discharge Exam: Vitals:   02/21/19 0819 02/21/19 0856  BP: (!) 145/47   Pulse: 62   Resp: 20   Temp: 98.7 F (37.1 C)   SpO2: 97% 94%   Vitals:   02/21/19 0001 02/21/19 0501 02/21/19 0819 02/21/19 0856  BP: (!) 133/39 (!) 146/56 (!) 145/47   Pulse: (!) 58 (!) 58 62   Resp: 18 18 20    Temp: 98.6 F (37 C) 98.5 F (36.9 C) 98.7 F (37.1 C)   TempSrc: Oral Oral Oral   SpO2: 97% 95% 97% 94%  Weight: 71.9 kg     Height:        General: Pt is alert, awake, not in acute distress Cardiovascular: RRR, S1/S2 +, no rubs, no gallops Respiratory: CTA bilaterally, no wheezing, no rhonchi Abdominal: Soft, NT, ND, bowel sounds + Extremities: no edema, no cyanosis    The results of significant diagnostics from this hospitalization (including imaging, microbiology, ancillary and laboratory) are listed below for reference.     Microbiology: Recent Results (from the past 240 hour(s))  SARS CORONAVIRUS 2 (TAT 6-24 HRS) Nasopharyngeal Nasopharyngeal Swab     Status: None   Collection Time: 02/19/19 10:07 PM   Specimen: Nasopharyngeal Swab  Result Value Ref Range Status   SARS Coronavirus 2 NEGATIVE NEGATIVE Final    Comment: (NOTE) SARS-CoV-2 target nucleic acids are NOT DETECTED. The SARS-CoV-2 RNA is generally detectable in upper and lower respiratory specimens during the acute phase of infection. Negative results do not preclude SARS-CoV-2 infection, do not rule out  co-infections with other pathogens, and should not be used as the sole basis for treatment or other patient  management decisions. Negative results must be combined with clinical observations, patient history, and epidemiological information. The expected result is Negative. Fact Sheet for Patients: SugarRoll.be Fact Sheet for Healthcare Providers: https://www.woods-mathews.com/ This test is not yet approved or cleared by the [place] FDA and  has been authorized for detection and/or diagnosis of SARS-CoV-2 by FDA under an Emergency Use Authorization (EUA). This EUA will remain  in effect (meaning this test can be used) for the duration of the COVID-19 declaration under Section 56 4(b)(1) of the Act, 21 U.S.C. section 360bbb-3(b)(1), unless the authorization is terminated or revoked sooner. Performed at [facility], [place] [address] [place], [place] [address]      Labs: BNP (last 3 results) No results for input(s): BNP in the last 8760 hours. Basic Metabolic Panel: Recent Labs  Lab 02/19/19 1125 02/20/19 1141 02/21/19 0605  NA 139 141 141  K 3.3* 3.4* 3.4*  CL 104 103 105  CO2 30* 28 27  GLUCOSE 233* 172* 153*  BUN 26 24* 29*  CREATININE 2.55* 3.00* 2.99*  CALCIUM 8.4* 8.5* 8.4*  MG  --  2.1  --    Liver Function Tests: Recent Labs  Lab 02/19/19 1125  AST 31  ALT 17  ALKPHOS 65  BILITOT 1.0  PROT 5.6*  ALBUMIN 2.9*   No results for input(s): LIPASE, AMYLASE in the last 168 hours. No results for input(s): AMMONIA in the last 168 hours. CBC: Recent Labs  Lab 02/19/19 1125 02/20/19 0440  WBC 7.3 5.8  HGB 8.6* 8.7*  HCT 27.8* 30.4*  MCV 94 102.4*  PLT 113* 106*   Cardiac Enzymes: No results for input(s): CKTOTAL, CKMB, CKMBINDEX, TROPONINI in the last 168 hours. BNP: Invalid input(s): POCBNP CBG: Recent Labs  Lab 02/20/19 0815 02/20/19 1115 02/20/19 1725 02/21/19 0045 02/21/19 0652  GLUCAP 137* 166* 166* 142* 154*   D-Dimer No results for input(s): DDIMER in the last 72 hours. Hgb A1c No results  for input(s): HGBA1C in the last 72 hours. Lipid Profile No results for input(s): CHOL, HDL, LDLCALC, TRIG, CHOLHDL, LDLDIRECT in the last 72 hours. Thyroid function studies No results for input(s): TSH, T4TOTAL, T3FREE, THYROIDAB in the last 72 hours.  Invalid input(s): FREET3 Anemia work up Recent Labs    02/21/19 0605  FERRITIN 38  TIBC 256  IRON 19*   Urinalysis    Component Value Date/Time   COLORURINE YELLOW 02/21/2019 0210   APPEARANCEUR CLEAR 02/21/2019 0210   LABSPEC 1.012 02/21/2019 0210   LABSPEC 1.020 02/24/2016 1119   PHURINE 8.0 02/21/2019 0210   GLUCOSEU 150 (A) 02/21/2019 0210   GLUCOSEU Negative 02/24/2016 1119   HGBUR MODERATE (A) 02/21/2019 0210   BILIRUBINUR NEGATIVE 02/21/2019 0210   BILIRUBINUR Negative 02/24/2016 1119   KETONESUR NEGATIVE 02/21/2019 0210   PROTEINUR 100 (A) 02/21/2019 0210   UROBILINOGEN 0.2 02/24/2016 1119   NITRITE NEGATIVE 02/21/2019 0210   LEUKOCYTESUR NEGATIVE 02/21/2019 0210   LEUKOCYTESUR Trace 02/24/2016 1119   Sepsis Labs Invalid input(s): PROCALCITONIN,  WBC,  LACTICIDVEN Microbiology Recent Results (from the past 240 hour(s))  SARS CORONAVIRUS 2 (TAT 6-24 HRS) Nasopharyngeal Nasopharyngeal Swab     Status: None   Collection Time: 02/19/19 10:07 PM   Specimen: Nasopharyngeal Swab  Result Value Ref Range Status   SARS Coronavirus 2 NEGATIVE NEGATIVE Final    Comment: (NOTE) SARS-CoV-2 target nucleic acids  are NOT DETECTED. The SARS-CoV-2 RNA is generally detectable in upper and lower respiratory specimens during the acute phase of infection. Negative results do not preclude SARS-CoV-2 infection, do not rule out co-infections with other pathogens, and should not be used as the sole basis for treatment or other patient management decisions. Negative results must be combined with clinical observations, patient history, and epidemiological information. The expected result is Negative. Fact Sheet for  Patients: SugarRoll.be Fact Sheet for Healthcare Providers: https://www.woods-mathews.com/ This test is not yet approved or cleared by the [place] FDA and  has been authorized for detection and/or diagnosis of SARS-CoV-2 by FDA under an Emergency Use Authorization (EUA). This EUA will remain  in effect (meaning this test can be used) for the duration of the COVID-19 declaration under Section 56 4(b)(1) of the Act, 21 U.S.C. section 360bbb-3(b)(1), unless the authorization is terminated or revoked sooner. Performed at [facility], [place] [address], [place], [place] [address]      Time coordinating discharge: 36  minutes  SIGNED:   Hosie Poisson, MD  [facility] 02/21/2019, 10:45 AM Pager   If 7PM-7AM, please contact night-coverage [email or web address] Password [email or web address]

## 2019-02-21 NOTE — Progress Notes (Signed)
[facility]    NEPHROLOGY PROGRESS NOTE  SUBJECTIVE: Patient feeling well this morning.  Denies chest pain, shortness of breath, nausea, vomiting, diarrhea or dysuria.  All other review of systems are negative.    OBJECTIVE:  Vitals:   02/21/19 0819 02/21/19 0856  BP: (!) 145/47   Pulse: 62   Resp: 20   Temp: 98.7 F (37.1 C)   SpO2: 97% 94%    Intake/Output Summary (Last 24 hours) at 02/21/2019 2202 Last data filed at 02/21/2019 0855 Gross per 24 hour  Intake 592.41 ml  Output 200 ml  Net 392.41 ml      General:  AAOx3 NAD HEENT: MMM Hannah [name] AT anicteric sclera Neck:  No JVD, no adenopathy CV:  Heart RRR  Lungs:  L/S CTA bilaterally Abd:  abd SNT/ND with normal BS GU:  Bladder non-palpable Extremities: Minimal bilateral lower extremity edema Skin:  No skin rash  MEDICATIONS:  . apixaban  5 mg Oral BID  . exemestane  25 mg Oral Daily  . ezetimibe  10 mg Oral Daily  . insulin aspart  0-9 Units Subcutaneous TID WC  . insulin glargine  18 Units Subcutaneous Daily  . loratadine  10 mg Oral Daily  . metoprolol tartrate  25 mg Oral BID  . mometasone-formoterol  2 puff Inhalation BID  . rosuvastatin  40 mg Oral QHS  . zolpidem  5 mg Oral QHS       LABS:   CBC Latest Ref Rng & Units 02/20/2019 02/19/2019 02/13/2019  WBC 4.0 - 10.5 K/uL 5.8 7.3 4.5  Hemoglobin 12.0 - 15.0 g/dL 8.7(L) 8.6(L) 8.3(L)  Hematocrit 36.0 - 46.0 % 30.4(L) 27.8(L) 28.7(L)  Platelets 150 - 400 K/uL 106(L) 113(L) 102(L)    CMP Latest Ref Rng & Units 02/21/2019 02/20/2019 02/19/2019  Glucose 70 - 99 mg/dL 153(H) 172(H) 233(H)  BUN 8 - 23 mg/dL 29(H) 24(H) 26  Creatinine 0.44 - 1.00 mg/dL 2.99(H) 3.00(H) 2.55(H)  Sodium 135 - 145 mmol/L 141 141 139  Potassium 3.5 - 5.1 mmol/L 3.4(L) 3.4(L) 3.3(L)  Chloride 98 - 111 mmol/L 105 103 104  CO2 22 - 32 mmol/L 27 28 30(H)  Calcium 8.9 - 10.3 mg/dL 8.4(L) 8.5(L) 8.4(L)  Total Protein 6.0 - 8.5 g/dL - - 5.6(L)  Total Bilirubin 0.0 - 1.2  mg/dL - - 1.0  Alkaline Phos 39 - 117 IU/L - - 65  AST 0 - 40 IU/L - - 31  ALT 0 - 32 IU/L - - 17    Lab Results  Component Value Date   CALCIUM 8.4 (L) 02/21/2019   CAION 1.19 06/14/2013       Component Value Date/Time   COLORURINE YELLOW 02/21/2019 0210   APPEARANCEUR CLEAR 02/21/2019 0210   LABSPEC 1.012 02/21/2019 0210   LABSPEC 1.020 02/24/2016 1119   PHURINE 8.0 02/21/2019 0210   GLUCOSEU 150 (A) 02/21/2019 0210   GLUCOSEU Negative 02/24/2016 1119   HGBUR MODERATE (A) 02/21/2019 0210   BILIRUBINUR NEGATIVE 02/21/2019 0210   BILIRUBINUR Negative 02/24/2016 1119   KETONESUR NEGATIVE 02/21/2019 0210   PROTEINUR 100 (A) 02/21/2019 0210   UROBILINOGEN 0.2 02/24/2016 1119   NITRITE NEGATIVE 02/21/2019 0210   LEUKOCYTESUR NEGATIVE 02/21/2019 0210   LEUKOCYTESUR Trace 02/24/2016 1119      Component Value Date/Time   PHART 7.415 (H) 04/06/2008 0828   PCO2ART 44.1 04/06/2008 0828   PO2ART 98.0 04/06/2008 0828   HCO3 28.2 (H) 04/06/2008 0828   TCO2 25 06/14/2013 2257  ACIDBASEDEF 2.0 04/05/2008 1655   O2SAT 97.0 04/06/2008 0828       Component Value Date/Time   IRON 19 (L) 02/21/2019 0605   IRON 64 12/24/2018 1225   TIBC 256 02/21/2019 0605   TIBC 205 (L) 12/24/2018 1225   FERRITIN 38 02/21/2019 0605   FERRITIN 358 (H) 12/24/2018 1225   IRONPCTSAT 7 (L) 02/21/2019 0605   IRONPCTSAT 31 12/24/2018 1225   IRONPCTSAT 33 07/10/2016 1037       ASSESSMENT/PLAN:    1.  CKD stage IV.  Baseline creatinine 1.5-2.  Likely related to longstanding DM.  Check urine protein for completeness.  [place] without hydronephrosis.  2.  AKI.  Likely related to diuresis.    UA consistent with former studies.   Would discharge on furosemide 40 mg daily starting tomorrow.  3.  Anemia of CKD.    Iron depletion noted.  Would send home on oral iron.  4.  Hypertension.  Improved with diuresis.  Will monitor.  5.  Acute on chronic diastolic congestive heart failure.  Appears relatively  euvolemic today.    Given presentation, would change discharge furosemide dose to 40 mg once daily.  6.  Community-acquired pneumonia.  Agree with antibiotics.     [place], DO, [place]

## 2019-02-21 NOTE — TOC Initial Note (Signed)
Transition of Care [facility]) - Initial/Assessment Note    Patient Details  Name: Hannah [name] MRN: [id number] Date of Birth: [birth year]  Transition of Care [facility]) CM/SW Contact:    Zenon Mayo, RN Phone Number: 02/21/2019, 10:21 AM  Clinical Narrative:                 From home with spouse, NCM offered choice for [facility] for CHF disease management, patient states she does not want HHRN.  She has no other needs at this time.  Expected Discharge Plan: Home/Self Care Barriers to Discharge: No Barriers Identified   Patient Goals and CMS Choice Patient states their goals for this hospitalization and ongoing recovery are:: to continue doing the right thing and eat correct diet CMS Medicare.gov Compare Post Acute Care list provided to:: Patient Choice offered to / list presented to : Patient  Expected Discharge Plan and Services Expected Discharge Plan: Home/Self Care In-house Referral: NA Discharge Planning Services: CM Consult Post Acute Care Choice: NA Living arrangements for the past 2 months: Single Family Home Expected Discharge Date: 02/21/19               DME Arranged: (NA)         HH Arranged: Patient Refused HH          Prior Living Arrangements/Services Living arrangements for the past 2 months: Single Family Home Lives with:: Spouse Patient language and need for interpreter reviewed:: Yes Do you feel safe going back to the place where you live?: Yes      Need for Family Participation in Patient Care: No (Comment) Care giver support system in place?: No (comment)   Criminal Activity/Legal Involvement Pertinent to Current Situation/Hospitalization: No - Comment as needed  Activities of Daily Living Home Assistive Devices/Equipment: None ADL Screening (condition at time of admission) Patient's cognitive ability adequate to safely complete daily activities?: Yes Is the patient deaf or have difficulty hearing?: No Does the patient have difficulty seeing,  even when wearing glasses/contacts?: No Does the patient have difficulty concentrating, remembering, or making decisions?: No Patient able to express need for assistance with ADLs?: Yes Does the patient have difficulty dressing or bathing?: No Independently performs ADLs?: Yes (appropriate for developmental age) Does the patient have difficulty walking or climbing stairs?: No Weakness of Legs: None Weakness of Arms/Hands: None  Permission Sought/Granted                  Emotional Assessment Appearance:: Appears stated age Attitude/Demeanor/Rapport: Gracious Affect (typically observed): Appropriate Orientation: : Oriented to Self, Oriented to Place, Oriented to  Time, Oriented to Situation Alcohol / Substance Use: Not Applicable Psych Involvement: No (comment)  Admission diagnosis:  Pleural effusion [J90] Prolonged QT interval [R94.31] Follow up [Z09] Acute renal failure superimposed on chronic kidney disease, unspecified CKD stage, unspecified acute renal failure type ([place]) [N17.9, N18.9] Acute exacerbation of CHF (congestive heart failure) ([place]) [I50.9] Patient Active Problem List   Diagnosis Date Noted  . Hypokalemia 02/20/2019  . Acute exacerbation of CHF (congestive heart failure) ([place]) 02/19/2019  . Pleural effusion on right 09/25/2018  . Thrombocytopenia ([place]) 09/25/2018  . Cholecystitis with cholelithiasis 07/07/2018  . Prolonged Q-T interval on ECG 05/21/2018  . Atrial fibrillation ([place]) 01/23/2018  . Closed fracture of head of left humerus 09/09/2017  . Syncope 09/06/2017  . Seizure-like activity ([place]) 09/01/2017  . AKI (acute kidney injury) ([place]) 09/01/2017  . Adrenal insufficiency ([place]) 07/03/2016  . Seizure ([place]) 06/29/2016  . Chemotherapy-induced peripheral  neuropathy ([place]) 04/27/2016  . 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 Osteomyelitis of toe of left foot ([place]) 04/06/2016  . Mania ([place]) 03/05/2016  . GERD (gastroesophageal reflux disease)  02/29/2016  . Chemotherapy induced nausea and vomiting 02/24/2016  . Port catheter in place 02/17/2016  . Sensorineural hearing loss (SNHL) of both ears 01/06/2016  . Breast cancer of upper-inner quadrant of right female breast ([place]) 12/16/2015  . Osteopenia of multiple sites 10/19/2015  . CKD (chronic kidney disease), stage IV ([place]) 09/24/2013  . Chronic diastolic CHF (congestive heart failure) ([place]) 09/24/2013  . Acute on chronic combined systolic and diastolic congestive heart failure ([place]) 07/17/2013  . Atrial tachycardia ([place]) 04/03/2013  . CAD (coronary artery disease) 01/24/2011  . Chronic coronary artery disease 01/24/2011  . History of aortic valve replacement 10/27/2010  . Hyperlipidemia   . DOE (dyspnea on exertion)   . Nonischemic cardiomyopathy ([place])   . Anemia of chronic disease   . Type 2 diabetes mellitus ([place])   . Iron deficiency anemia   . Allergic rhinitis 10/14/2009  . Essential hypertension 05/12/2009  . H/O atrial tachycardia 05/12/2009  . Asthma 05/12/2009   PCP:  [facility]:   [place], [place] - [address] [place] [address] Phone: [phone] Fax: [phone]  [place], [facility] [address] [place] [address] [place] [address] Phone: [phone] Fax: [phone]     Social Determinants of Health ([place]) Interventions    Readmission Risk Interventions Readmission Risk Prevention Plan 02/21/2019  Transportation Screening Complete  PCP or Specialist Appt within 3-5 Days Complete  HRI or [place] Complete  Social Work Consult for [place] Planning/Counseling Complete  Palliative Care Screening Not Applicable  Medication Review Press photographer) Complete  Some recent data might be hidden

## 2019-02-23 ENCOUNTER — Other Ambulatory Visit: Payer: Self-pay

## 2019-02-23 ENCOUNTER — Encounter: Payer: Self-pay | Admitting: Gastroenterology

## 2019-02-23 ENCOUNTER — Ambulatory Visit (INDEPENDENT_AMBULATORY_CARE_PROVIDER_SITE_OTHER): Payer: Medicare Other | Admitting: Nurse Practitioner

## 2019-02-23 ENCOUNTER — Telehealth: Payer: Self-pay | Admitting: Gastroenterology

## 2019-02-23 ENCOUNTER — Encounter: Payer: Self-pay | Admitting: Nurse Practitioner

## 2019-02-23 VITALS — BP 122/58 | HR 60 | Ht 65.0 in | Wt 159.4 lb

## 2019-02-23 DIAGNOSIS — I2510 Atherosclerotic heart disease of native coronary artery without angina pectoris: Secondary | ICD-10-CM | POA: Diagnosis not present

## 2019-02-23 DIAGNOSIS — I428 Other cardiomyopathies: Secondary | ICD-10-CM

## 2019-02-23 DIAGNOSIS — R0600 Dyspnea, unspecified: Secondary | ICD-10-CM

## 2019-02-23 DIAGNOSIS — I5032 Chronic diastolic (congestive) heart failure: Secondary | ICD-10-CM

## 2019-02-23 DIAGNOSIS — N184 Chronic kidney disease, stage 4 (severe): Secondary | ICD-10-CM | POA: Diagnosis not present

## 2019-02-23 DIAGNOSIS — Z7189 Other specified counseling: Secondary | ICD-10-CM

## 2019-02-23 MED ORDER — POTASSIUM CHLORIDE CRYS ER 20 MEQ PO TBCR: 20.0000 meq | EXTENDED_RELEASE_TABLET | Freq: Every day | ORAL | 3 refills | Status: DC

## 2019-02-23 MED ORDER — FUROSEMIDE 40 MG PO TABS: 40.0000 mg | ORAL_TABLET | Freq: Every day | ORAL | 11 refills | Status: DC

## 2019-02-23 NOTE — Patient Instructions (Addendum)
After Visit Summary:  We will be checking the following labs today - BMET, CBC and BNP   Medication Instructions:    Continue with your current medicines.   But we will cut the Lasix to 20 mg alternating with 40 mg  Go ahead and pick up the midodrine - but do not start taking   If you need a refill on your cardiac medications before your next appointment, please call your pharmacy.     Testing/Procedures To Be Arranged:  N/A  Follow-Up:   See me next week as planned - CXR before the visit     At [facility], you and your health needs are our priority.  As part of our continuing mission to provide you with exceptional heart care, we have created designated Provider Care Teams.  These Care Teams include your primary Cardiologist (physician) and Advanced Practice Providers (APPs -  Physician Assistants and Nurse Practitioners) who all work together to provide you with the care you need, when you need it.  Special Instructions:  . Stay safe, stay home, wash your hands for at least 20 seconds and wear a mask when out in public.  . It was good to talk with you today.  . Call Gi today and find out about the ultrasound and if you need CT scan of the abdomen . Call [place] [phone] for primary care . Talk with Dr. Lindi Adie about continuing the iron pills   Call the [place] office at [phone] if you have any questions, problems or concerns.

## 2019-02-23 NOTE — Telephone Encounter (Signed)
Pt inquired about results of US.  

## 2019-02-23 NOTE — Telephone Encounter (Signed)
Please review previous message and advise 

## 2019-02-24 ENCOUNTER — Other Ambulatory Visit: Payer: Self-pay | Admitting: *Deleted

## 2019-02-24 LAB — PRO B NATRIURETIC PEPTIDE: NT-Pro BNP: 2825 pg/mL — ABNORMAL HIGH (ref 0–301)

## 2019-02-24 LAB — CBC WITH DIFFERENTIAL/PLATELET
Basophils Absolute: 0 10*3/uL (ref 0.0–0.2)
Basos: 1 %
EOS (ABSOLUTE): 0.2 10*3/uL (ref 0.0–0.4)
Eos: 4 %
Hematocrit: 28.3 % — ABNORMAL LOW (ref 34.0–46.6)
Hemoglobin: 8.7 g/dL — ABNORMAL LOW (ref 11.1–15.9)
Immature Grans (Abs): 0 10*3/uL (ref 0.0–0.1)
Immature Granulocytes: 0 %
Lymphocytes Absolute: 0.5 10*3/uL — ABNORMAL LOW (ref 0.7–3.1)
Lymphs: 9 %
MCH: 29 pg (ref 26.6–33.0)
MCHC: 30.7 g/dL — ABNORMAL LOW (ref 31.5–35.7)
MCV: 94 fL (ref 79–97)
Monocytes Absolute: 0.7 10*3/uL (ref 0.1–0.9)
Monocytes: 12 %
Neutrophils Absolute: 4.5 10*3/uL (ref 1.4–7.0)
Neutrophils: 74 %
Platelets: 119 10*3/uL — ABNORMAL LOW (ref 150–450)
RBC: 3 x10E6/uL — ABNORMAL LOW (ref 3.77–5.28)
RDW: 13.8 % (ref 11.7–15.4)
WBC: 6 10*3/uL (ref 3.4–10.8)

## 2019-02-24 LAB — BASIC METABOLIC PANEL
BUN/Creatinine Ratio: 14 (ref 12–28)
BUN: 33 mg/dL — ABNORMAL HIGH (ref 8–27)
CO2: 26 mmol/L (ref 20–29)
Calcium: 8.4 mg/dL — ABNORMAL LOW (ref 8.7–10.3)
Chloride: 105 mmol/L (ref 96–106)
Creatinine, Ser: 2.4 mg/dL — ABNORMAL HIGH (ref 0.57–1.00)
GFR calc Af Amer: 22 mL/min/{1.73_m2} — ABNORMAL LOW (ref >59)
GFR calc non Af Amer: 19 mL/min/{1.73_m2} — ABNORMAL LOW (ref >59)
Glucose: 119 mg/dL — ABNORMAL HIGH (ref 65–99)
Potassium: 4 mmol/L (ref 3.5–5.2)
Sodium: 142 mmol/L (ref 134–144)

## 2019-02-24 MED ORDER — FUROSEMIDE 40 MG PO TABS: 40.0000 mg | ORAL_TABLET | Freq: Every day | ORAL | 11 refills | Status: DC

## 2019-02-26 NOTE — Progress Notes (Deleted)
CARDIOLOGY OFFICE NOTE  Date:  03/03/2019    Hannah [name] Date of Birth: 08/02/1945 Medical Record #[id number]  PCP:  Hannah Pai, PA-C  Cardiologist:  Hannah [name]    No chief complaint on file.   History of Present Illness: Hannah [name] is a 73 y.o. female who presents today for a one week check. Seen for Hannah [name].   She has a very complex history - this includes a history ofCAD s/p CABG (1998 with redo 2009), bioprosthetic AVR in 3734, chronic diastolic CHF, asthma, carotid artery disease (1-39% by duplex 10/2017, due 2021), CKD stage IV, PAF - has been on Multaq in the past and now on amiodarone, atrial tachycardia, breast CA, orthostasis, chronic anemia, thrombocytopenia, mild dilation of ascending aorta by echo 10/2018, recurrent syncope/orthostasis, recurrent pleural effusion requiring thoracentesis4/2020, HTN, GERD, & HLD.   I have followed her for many years - she has had periodic issues with volume overload, orthostasis and tachy palpitations. Last cath in 2014 - grafts patent. Myoview in 2015 without ischemia.  Over the past few months she has had more issues with her pleural effusion - diuretics caused worsening CKD/orthostasis. She has gotten more anemic - back to hematology and now on therapy. Concern for MDS and will be having bone marrow biopsy - also needing colonoscopy due to profound anemia (HGB down to 6 which is highly unusual for Hannah [name]). Most recent abdominal ultrasound noted and concerning for liver abnormality. Recent mammogram also recently noted and abnormal - ended up having repeat biopsy.   I saw her about a month ago - we had restarted low dose diuretics for recurring pleural effusion - she was better at my visit but I was still quite concerned that there is "something else going on" that has just not been found yet.  She has had progressive failure to thrive. Seen 2 weeks ago as a work in by Hannah [name], Hannah [name] for worsening dyspnea -  increasing pleural effusion - worsening CKD - referred to the ED and was then admitted - treated for pneumonia.   I then saw her last week - she was a little better - she (and I were surprised of this diagnosis of pneumonia) - CXR reports were "unchanged".  She had upcoming colonoscopy for her anemia, also seeing heme/onc. Was not aware of her abdominal US results. May need CT scanning.    The patient {does/does not:200015} have symptoms concerning for COVID-19 infection (fever, chills, cough, or new shortness of breath).   Comes in today. Here with   Past Medical History:  Diagnosis Date  . Abnormally small mouth   . Allergic rhinitis 10/14/2009   Qualifier: Diagnosis of  By: Hannah Sakai MD, Hannah [name]   Overview:  Overview:  Qualifier: Diagnosis of  By: Hannah Sakai MD, Hannah [name]  Last Assessment & Plan:  Please continue Xyzal and Nasacort as you have been using them  . Anemia   . Asthma 05/12/2009   10/12/2014 p extensive coaching HFA effectiveness =    75% s spacer    Overview:  Overview:  10/12/2014 p extensive coaching HFA effectiveness =    75% s spacer   Last Assessment & Plan:  Please continue Symbicort 2 puffs twice a day. Remember to rinse and gargle after taking this medication. Take albuterol 2 puffs up to every 4 hours if needed for shortness of breath.  Follow with Dr Hannah [name] in 6 month  . Bilateral carotid artery stenosis  a. mild 1-39% by duplex 10/2017, due 2021.  . Breast cancer ([place]) 01/18/2016   right breast  . CAD (coronary artery disease) 01/24/2011   a. s/p CABG 1998 with redo 2009.  Marland Kitchen Chemotherapy induced nausea and vomiting 02/24/2016  . Chemotherapy-induced peripheral neuropathy ([place]) 04/27/2016  . 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 Chronic diastolic CHF (congestive heart failure) ([place]) 09/24/2013  . CKD (chronic kidney disease), stage IV ([place]) 09/24/2013  . Closed fracture of head of left humerus 09/09/2017  . Complication of anesthesia   . Essential hypertension  05/12/2009   Qualifier: Diagnosis of  By: Hannah Sakai MD, Hannah [name]   . Gallstones   . GERD (gastroesophageal reflux disease)   . Glaucoma   . H/O atrial tachycardia 05/12/2009   Qualifier: History of  By: Hannah Sakai MD, Hannah [name]   Overview:  Overview:  Qualifier: History of  By: Hannah Sakai MD, Hannah [name]  Last Assessment & Plan:  There was no evidence of this on her cardiac monitor. I would recommend watchful waiting.   Marland Kitchen History of non-ST elevation myocardial infarction (NSTEMI)    Sept 2014--  thought to be type II HTN w/ LHC without infarct related artery and patent grafts  . History of radiation therapy 05/24/16-07/26/16   right breast 50.4 Gy in 28 fractions, right breast boost 10 Gy in 5 fractions  . Hyperlipidemia   . Hypertension    Resolved.  . Iron deficiency anemia   . Mania ([place]) 03/05/2016  . Moderate persistent asthma    pulmologist-  Hannah [name]  . Osteomyelitis of toe of left foot ([place]) 04/06/2016  . Osteopenia of multiple sites 10/19/2015  . PAF (paroxysmal atrial fibrillation) ([place])   . Personal history of chemotherapy 2017  . Personal history of radiation therapy 2017  . PONV (postoperative nausea and vomiting)   . Port catheter in place 02/17/2016  . Psoriasis    right leg  . Renal calculus, right   . S/P AVR    prosthesis valve placement 2009 at same time re-do CABG  . Seizure-like activity ([place]) 09/01/2017  . Sensorineural hearing loss (SNHL) of both ears 01/06/2016  . Stroke [facility])    residual rt hearing loss  . Syncope 09/06/2017  . Type 2 diabetes mellitus ([place])    monitored by dr Hannah [name]    Past Surgical History:  Procedure Laterality Date  . AORTIC VALVE REPLACEMENT  2009   [facility]done same time is CABG)  . BREAST LUMPECTOMY Right 01/18/2016  . BREAST LUMPECTOMY WITH RADIOACTIVE SEED AND SENTINEL LYMPH NODE BIOPSY Right 01/18/2016   Procedure: RIGHT BREAST LUMPECTOMY WITH RADIOACTIVE SEED AND SENTINEL LYMPH NODE BIOPSY;  Surgeon: DAlphonsa Overall  MD;  [facility]  Service: General;  Laterality: Right;  . CARDIAC CATHETERIZATION  03/23/2008   Pre-redo CABG: L main OK, LAD (T), CFX (T), OM1 99%, RCA (T), LIMA-LAD OK, SVG-OM(?3) OK w/ little florw to OM2, SVG-RCA OK. EF NL  . CARPAL TUNNEL RELEASE    . CHOLECYSTECTOMY N/A 07/07/2018   Procedure: LAPAROSCOPIC CHOLECYSTECTOMY WITH INTRAOPERATIVE CHOLANGIOGRAM ERAS PATHWAY;  Surgeon: NAlphonsa Overall MD;  Location: [facility];  Service: General;  Laterality: N/A;  . COLONOSCOPY     around 2015. Possibly with [place]   . CORONARY ARTERY BYPASS GRAFT  1998 &  re-do 2009   Had LIMA to DX/LAD, SVG to 2 marginal branches and [facility]; SVG to 3rd OM and PD at time of redo  . CYSTOSCOPY  W/ URETERAL STENT PLACEMENT Right 12/20/2014   Procedure: CYSTOSCOPY WITH RETROGRADE PYELOGRAM/URETERAL STENT PLACEMENT;  Surgeon: Cleon Gustin, MD;  Location: [facility];  Service: Urology;  Laterality: Right;  . ESOPHAGOGASTRODUODENOSCOPY     many years ago per patient   . ESOPHAGOGASTRODUODENOSCOPY ENDOSCOPY  06/17/2018  . EYE SURGERY Bilateral    cataracts  . HOLMIUM LASER APPLICATION Right 6/43/3295   Procedure:  HOLMIUM LASER LITHOTRIPSY;  Surgeon: Cleon Gustin, MD;  Location: [facility];  Service: Urology;  Laterality: Right;  . IR THORACENTESIS ASP PLEURAL SPACE W/IMG GUIDE  10/03/2018  . LEFT HEART CATHETERIZATION WITH CORONARY/GRAFT ANGIOGRAM N/A 02/23/2013   Procedure: LEFT HEART CATHETERIZATION WITH Beatrix Fetters;  Surgeon: Blane Ohara, MD;  Location: [facility] CATH LAB;  Service: Cardiovascular;  Laterality: N/A;  . LOOP RECORDER INSERTION N/A 08/30/2017   Procedure: LOOP RECORDER INSERTION;  Surgeon: Evans Lance, MD;  Location: [facility] [place] [facility];  Service: Cardiovascular;  Laterality: N/A;  . PORTACATH PLACEMENT Left 01/18/2016   Procedure: INSERTION PORT-A-CATH;  Surgeon: Alphonsa Overall, MD;  Location: [place];  Service: General;  Laterality:  Left;  . portacath removal    . TONSILLECTOMY    . TRANSTHORACIC ECHOCARDIOGRAM  02-24-2013      mild LVH,  ef 50-55%/  AV bioprosthesis was present with very mild stenosis and no regurg., mean grandient 74mHg, peak grandient 2103mg /  mild MR/  mild LAE and RAE/  moderate TR  . TUBAL LIGATION       Medications: No outpatient medications have been marked as taking for the 03/04/19 encounter (Appointment) with GeBurtis JunesNP.     Allergies: Allergies  Allergen Reactions  . Amoxicillin Rash and Other (See Comments)    Tolerates Cephalosporins Has patient had a PCN reaction causing immediate rash, facial/tongue/throat swelling, SOB or lightheadedness with hypotension: Yes Has patient had a PCN reaction causing severe rash involving mucus membranes or skin necrosis: Yes Has patient had a PCN reaction that required hospitalization No Has patient had a PCN reaction occurring within the last 10 years: No If all of the above answers are "NO", then may proceed with Cephalosporin use.   . Tape Other (See Comments)    Must use paper tape  . Aldactone [Spironolactone] Other (See Comments)    CKD/hypokalemia  . Imdur [Isosorbide Dinitrate] Other (See Comments)    Headache/severe hypotension/Syncope  . Arimidex [Anastrozole] Nausea Only  . Latex Itching and Other (See Comments)    (Dentist office)  . Tetracycline Rash    Social History: The patient  reports that she has never smoked. She has never used smokeless tobacco. She reports that she does not drink alcohol or use drugs.   Family History: The patient's ***family history includes Diabetes in her maternal grandmother and son; Healthy in her brother; Heart attack in her brother; Heart disease in her brother, father, and maternal grandmother; Heart failure in her father.   Review of Systems: Please see the history of present illness.   All other systems are reviewed and negative.   Physical Exam: VS:  There were no vitals  taken for this visit. . Marland KitchenBMI There is no height or weight on file to calculate BMI.  Wt Readings from Last 3 Encounters:  02/27/19 157 lb 8 oz (71.4 kg)  02/23/19 159 lb 6.4 oz (72.3 kg)  02/21/19 158 lb 8 oz (71.9 kg)    General: Pleasant. Well developed, well nourished and in no  acute distress.   HEENT: Normal.  Neck: Supple, no JVD, carotid bruits, or masses noted.  Cardiac: ***Regular rate and rhythm. No murmurs, rubs, or gallops. No edema.  Respiratory:  Lungs are clear to auscultation bilaterally with normal work of breathing.  GI: Soft and nontender.  MS: No deformity or atrophy. Gait and ROM intact.  Skin: Warm and dry. Color is normal.  Neuro:  Strength and sensation are intact and no gross focal deficits noted.  Psych: Alert, appropriate and with normal affect.   LABORATORY DATA:  EKG:  EKG {ACTION; IS/IS XHB:[id number]} ordered today. This demonstrates ***.  Lab Results  Component Value Date   WBC 6.7 02/27/2019   HGB 8.7 (L) 02/27/2019   HCT 29.8 (L) 02/27/2019   PLT 118 (L) 02/27/2019   GLUCOSE 119 (H) 02/23/2019   CHOL 106 12/02/2018   TRIG 97 12/02/2018   HDL 34 (L) 12/02/2018   LDLDIRECT 53.0 10/08/2016   LDLCALC 53 12/02/2018   ALT 17 02/19/2019   AST 31 02/19/2019   NA 142 02/23/2019   K 4.0 02/23/2019   CL 105 02/23/2019   CREATININE 2.40 (H) 02/23/2019   BUN 33 (H) 02/23/2019   CO2 26 02/23/2019   TSH 2.900 01/19/2019   INR 1.91 05/21/2018   HGBA1C 5.6 02/09/2019   MICROALBUR 53.4 (H) 05/12/2018     BNP (last 3 results) No results for input(s): BNP in the last 8760 hours.  ProBNP (last 3 results) Recent Labs    10/28/18 1041 02/19/19 1126 02/23/19 1221  PROBNP 364.0* 2,996* 2,825*     Other Studies Reviewed Today:  CXR IMPRESSION 02/20/2019: Small right effusion and right lower lobe airspace disease unchanged. Negative for edema.   Electronically Signed By: Franchot Gallo M.D. On: 02/20/2019 15:39   ABD [place] 02/17/2019   Other findings: There is a somewhat lobulated fluid collection identified along the inferior aspect of the liver. This measures 2.6 x 3.4 x 4.3 cm. This was not seen on the prior CT examination and may be related to a focal fluid collection from prior cholecystectomy. Mild perihepatic ascites is noted.  Note is made of right-sided pleural effusion.  IMPRESSION: Status post cholecystectomy. There is a lobulated cystic area as described above along the inferior right hepatic margin. This could be related to the prior surgery possibly representing a small seroma. CT may be helpful for further evaluation.  Small right pleural effusion.  Mild ascites.  Increased echogenicity within the liver likely related to fatty infiltration or underlying hepatocellular disease.  Electronically Signed By: Inez Catalina M.D. On: 02/17/2019 10:18  Breast Ultrasound 01/2019 IMPRESSION: 1. New irregular hypoechoic mass along the posterior margin of the scar right breast 8 o'clock position. Findings are nonspecific however concerning for the possibility of localized recurrence. 2. Within the outer aspect of the right axilla there are two adjacent irregular hypoechoic masses just deep to the skin which are nonspecific. These may represent focal fat necrosis or potentially recurrent or metastatic disease.  RECOMMENDATION: 1. Ultrasound-guided core needle biopsy right breast mass 8 o'clock position just deep to the scar. 2. Ultrasound-guided core needle biopsy of both irregular hypoechoic masses within the outer aspect of the right axilla.     ECHOIMPRESSIONS5/2020  1. The left ventricle has normal systolic function with an ejection fraction of 60-65%. The cavity size was normal. There is mildly increased left ventricular wall thickness. Left ventricular diastolic Doppler parameters are consistent with  pseudonormalization. 2. The right ventricle has normal systolic  function.  The cavity was mildly enlarged. 3. Left atrial size was moderately dilated. 4. Right atrial size was mildly dilated. 5. The mitral valve is grossly normal. Mild thickening of the mitral valve leaflet. There is mild mitral annular calcification present. 6. The tricuspid valve is grossly normal. 7. There is mild dilatation of the ascending aorta measuring 40 mm. 8. Normal LV systolic function; moderate diastolic dysfunction; mild LVH; mildly dilated ascending aorta; s/p AVR with mean gradient of 18 mmHg and no AI; biatrial enlargement; mild MR; mild RVE.   EchoStudy Conclusions8/2019  - Left ventricle: The cavity size was normal. Systolic function was normal. The estimated ejection fraction was in the range of 60% to 65%.Wall motion was normal; there were no regional wall motion abnormalities. Features are consistent with a pseudonormal left ventricular filling pattern, with concomitant abnormal relaxation and increased filling pressure (grade 2 diastolic dysfunction). Doppler parameters are consistent with high ventricular filling pressure. - Aortic valve: A bioprosthesis was present and functioning normally. Mean gradient (S): 16 mm Hg. - Mitral valve: Calcified annulus. There was mild regurgitation. - Left atrium: The atrium was moderately dilated. - Right ventricle: The cavity size was mildly dilated. Wall thickness was normal. - Tricuspid valve: There was mild regurgitation. - Pulmonary arteries: Systolic pressure could not be accurately estimated.   MYOVIEW FINDINGS FROM 06/2013: Normal resting EKG. Slight ST depressions noted in aVL after administration of lexiscan. Mild shortness of breath and nausea resolved after the test. EKG is nondiagnostic for ischemia. TID ratio 1.05. Lung-heart ratio 0.43. Normal ventricular chamber size.  IMPRESSION: No evidence for ischemia. Normal wall motion. LVEF 72%.  Electronically Signed  By: Guy Sandifer   Cardiac Cath: 02/23/2013 Left mainstem: Normal  Left anterior descending (LAD): Severe proximal and mid calcification with long proximal 95% stenosis. The mid and distal vessel is small but free of high grade disease.  Left circumflex (LCx):AV groove has a mid 90% stenosis before a moderate sized MOM. OM1 and OM2 are occluded at the ostium and fill via the SVG. OM3 occluded at the ostium and fills via SVG. The grafted OMs are small and diffusely disease.  Right coronary artery (RCA): Occluded in the mid vessel. The PDA is moderate sized and occluded at the ostium. There is a 70% stenosis in the PDA after the insertion of the vein graft.  Grafts:  LIMA to LAD: Patent  SVG to RCA: Occluded (from original CABG)  SVG to RCA: Patent (from redo CABG). This is mild diffuse plaque within the vein graft.  SVG to OM3: Patent. This is mild diffuse plaque within the vein graft.  SVG sequential to OM1/OM2: Patent. There is ostial 50% stenosis. There is a patent proximal SVG stent. The native marginals are very small.  Left ventriculography: LV not injected. AVR not crossed.  Final Conclusions: Severe native vessel CAD. Patent grafts as described with nonobstructive disease in the grafts. A stent placed into the sequential SVG is patent with some disease at the ostium that on several views in not occlusive. I did compare the 2009 cath with results today. There is high grade disease in the circumflex AV groove. However, this is not changed from 2009. However, this lesion does appear to lead into a non grafted OM. If she has further symptoms I would consider PCI of the native circumflex.     Assessment/Plan:  1. Recent admission for worsening shortness of breath - most likely multifactorial with chronic diastolic CHF, recurrent effusion - made worse with severe anemia  that is being evaluated - known orthostasis with prior injury. Has been treated with IV lasix - now on higher oral  doses, also given course of antibiotics.   Will plan for repeat CXR next week - may need to consider CT of the chest without contrast.   2. Abnormal Abd [place] - may need CT - she is going to call GI and get their input.   3. Profound anemia - may still be needing BMS - plans for colonoscopy next week. She is on iron - oral replacement - she will discuss continuing this with Dr. Lindi Adie - seeing later this week.   4. CAD/aortic valve disease - remote CABG/AVR - managed medically - no active symptoms and would favor ongoing medical therapy given her degree of CKD  5. CKD - stage IV - she is going to call and make appointment to see nephrology.  6. History of orthostasis - reports negative adrenal work up - I see where she had an [place] from March 2019 - not sure if we need to re-consider this going forward.  7. PAF - in sinus by exam today - on Eliquis  8. Carotid disease-noted 1 to 39% bilateral stenosis from study 10/2017 -will need repeat study 10/2019- not discussed today  9. HTN - BP is fine - typically always lower at home.   10. Prior breast cancer - recent abnormal mammogram - s/p biopsies.   Marland Kitchen COVID-19 Education: The signs and symptoms of COVID-19 were discussed with the patient and how to seek care for testing (follow up with PCP or arrange E-visit).  The importance of social distancing, staying at home, hand hygiene and wearing a mask when out in public were discussed today.  Current medicines are reviewed with the patient today.  The patient does not have concerns regarding medicines other than what has been noted above.  The following changes have been made:  See above.  Labs/ tests ordered today include:   No orders of the defined types were placed in this encounter.    Disposition:   FU with *** in {gen number 1-59:470761} {Days to years:10300}.   Patient is agreeable to this plan and will call if any problems develop in the interim.   SignedTruitt Merle, NP  03/03/2019 12:31 PM  [facility] [address] [place], [place]  [address] Phone: [phone] Fax: [phone]

## 2019-02-26 NOTE — Progress Notes (Signed)
Patient Care Team: Saguier, Iris Pert as PCP - General (Internal Medicine) [name], NP as PCP - Cardiology (Nurse Practitioner) Evans Lance, MD as PCP - Electrophysiology (Cardiology) Alphonsa Overall, MD as Consulting Physician (General Surgery) Nicholas Lose, MD as Consulting Physician (Hematology and Oncology) Gery Pray, MD as Consulting Physician (Radiation Oncology) Evans Lance, MD as Consulting Physician (Cardiology) Collene Gobble, MD as Consulting Physician (Pulmonary Disease) Elayne Snare, MD as Consulting Physician (Endocrinology) Delice Bison, Charlestine Massed, NP as Nurse Practitioner (Hematology and Oncology) Ardelle Balls., MD (Neurology) Izora Gala, MD as Consulting Physician (Otolaryngology) Trula Slade, DPM as Consulting Physician (Podiatry)  DIAGNOSIS:    ICD-10-CM   1. Anemia of chronic disease  D63.8     SUMMARY OF ONCOLOGIC HISTORY: Oncology History  Breast cancer of upper-inner quadrant of right female breast ([place])  12/14/2015 Initial Diagnosis   Right breast biopsy 12:30 position: 2 masses, 1.6 cm mass: Invasive ductal carcinoma, grade 2, ER 0%, PR 0%, HER-2 negative, Ki-67 70%; satellite mass 8 mm: IDC grade 2 ER 5%, PR 5%, HER-2 negative, Ki-67 50%; T1cN0 stage IA clinical stage   01/18/2016 Surgery   Right lumpectomy: Multifocal IDC grade 3, 1.9 cm  ER 0%, PR 0%, HER-2 negative, Ki-67 70% and 0.8 cm satellite mass ER 5%, PR 2%, HER-2 negative, Ki-67 50%, high-grade DCIS, margins negative, 0/1 lymph nodes negative T1 cN0 stage IA   02/17/2016 - 04/06/2016 Chemotherapy   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    04/08/2016 - 04/23/2016 Hospital Admission   Hosp adm for cellulitis   05/24/2016 - 07/25/2016 Radiation Therapy    Adj XRT   06/29/2016 - 07/04/2016 Hospital Admission   Seizure like activity, MRI brain and EEG unremarkable    08/16/2016 -  Anti-estrogen oral therapy   Letrozole could not tolerate it due to dizziness and lightheadedness, switched to anastrozole 04/04/2017 switched to exemestane 05/22/2017     CHIEF COMPLIANT: Follow-upofsevereanemia on Retacrit injections  INTERVAL HISTORY: Hannah [name] is a 73 y.o. with above-mentioned history of right breast cancer treated with lumpectomy,adjuvant chemotherapy,radiation, and whois currently on antiestrogen therapywith exemestane.Shealsohas a history of anemia, and previously received PRBCs. She is currently receiving Retacrit injections. She was admitted from 9/10-9/12 for exacerbation of her CHF. She presents to the clinic today for Retacrit and to review her recent labs.   REVIEW OF SYSTEMS:   Constitutional: Denies fevers, chills or abnormal weight loss Eyes: Denies blurriness of vision Ears, nose, mouth, throat, and face: Denies mucositis or sore throat Respiratory: Denies cough, dyspnea or wheezes Cardiovascular: Denies palpitation, chest discomfort Gastrointestinal: Denies nausea, heartburn or change in bowel habits Skin: Denies abnormal skin rashes Lymphatics: Denies new lymphadenopathy or easy bruising Neurological: Denies numbness, tingling or new weaknesses Behavioral/Psych: Mood is stable, no new changes  Extremities: No lower extremity edema Breast: denies any pain or lumps or nodules in either breasts All other systems were reviewed with the patient and are negative.  I have reviewed the past medical history, past surgical history, social history and family history with the patient and they are unchanged from previous note.  ALLERGIES:  is allergic to amoxicillin; tape; aldactone [spironolactone]; imdur [isosorbide dinitrate]; arimidex [anastrozole]; latex; and tetracycline.  MEDICATIONS:  Current Outpatient Medications  Medication Sig Dispense Refill  . ACCU-CHEK GUIDE test strip Use to test blood sugar 3 times daily 100 each 3  . acetaminophen (TYLENOL) 325 MG tablet  Take 2  tablets (650 mg total) by mouth every 6 (six) hours as needed for mild pain (or Fever >/= 101).    Marland Kitchen albuterol (PROVENTIL HFA;VENTOLIN HFA) 108 (90 Base) MCG/ACT inhaler Inhale 2 puffs into the lungs every 6 (six) hours as needed for wheezing or shortness of breath.    Marland Kitchen amiodarone (PACERONE) 200 MG tablet Take 1 tablet (200 mg total) by mouth daily. 90 tablet 3  . Blood Glucose Monitoring Suppl (ACCU-CHEK AVIVA PLUS) w/Device KIT Use accu chek aviva meter to check blood sugar three times daily. 1 kit 0  . budesonide-formoterol (SYMBICORT) 160-4.5 MCG/ACT inhaler INHALE TWO PUFFS INTO THE LUNGS TWICE DAILY 3 Inhaler 1  . ELIQUIS 5 MG TABS tablet TAKE ONE TABLET BY MOUTH TWICE DAILY 60 tablet 6  . epoetin alfa-epbx (RETACRIT) 92119 UNIT/ML injection Inject 40,000 Units into the skin every Friday.    Marland Kitchen exemestane (AROMASIN) 25 MG tablet TAKE ONE TABLET BY MOUTH DAILY AFTER BREAKFAST 90 tablet 3  . ezetimibe (ZETIA) 10 MG tablet TAKE ONE TABLET BY MOUTH EVERY DAY 90 tablet 3  . famotidine (PEPCID) 20 MG tablet One after supper 30 tablet 11  . ferrous sulfate 325 (65 FE) MG EC tablet Take 1 tablet (325 mg total) by mouth 2 (two) times daily. 60 tablet 2  . furosemide (LASIX) 40 MG tablet Take 1 tablet (40 mg total) by mouth daily. 30 tablet 11  . insulin glargine (LANTUS) 100 UNIT/ML injection Inject 16 units under the skin once daily in the morning. 20 mL 3  . insulin lispro (HUMALOG) 100 UNIT/ML injection Inject 4-6 units under the skin three times daily before meals. 20 mL 3  . Insulin Pen Needle 32G X 4 MM MISC Use as instructed to inject insulin 4 times daily. 400 each 3  . loratadine (CLARITIN) 10 MG tablet Take 10 mg by mouth daily.    . metoprolol tartrate (LOPRESSOR) 25 MG tablet Take 1 tablet (25 mg total) by mouth 2 (two) times daily. 180 tablet 3  . nitroGLYCERIN (NITROSTAT) 0.4 MG SL tablet Place 1 tablet (0.4 mg total) under the tongue every 5 (five) minutes as needed for chest  pain. 30 tablet 3  . potassium chloride SA (K-DUR) 20 MEQ tablet Take 1 tablet (20 mEq total) by mouth daily. 90 tablet 3  . rosuvastatin (CRESTOR) 40 MG tablet Take 1 tablet (40 mg total) by mouth at bedtime. 90 tablet 3  . zolpidem (AMBIEN) 5 MG tablet TAKE ONE TABLET BY MOUTH AT BEDTIME AS NEEDED FOR SLEEP 30 tablet 2   No current facility-administered medications for this visit.    Facility-Administered Medications Ordered in Other Visits  Medication Dose Route Frequency Provider Last Rate Last Dose  . epoetin alfa-epbx (RETACRIT) injection 40,000 Units  40,000 Units Subcutaneous Once Nicholas Lose, MD        PHYSICAL EXAMINATION: ECOG PERFORMANCE STATUS: 2 - Symptomatic, <50% confined to bed  Vitals:   02/27/19 1120  BP: (!) 130/40  Pulse: (!) 58  Resp: 18  Temp: 98.2 F (36.8 C)  SpO2: 100%   Filed Weights   02/27/19 1120  Weight: 157 lb 8 oz (71.4 kg)    GENERAL: alert, no distress and comfortable SKIN: skin color, texture, turgor are normal, no rashes or significant lesions EYES: normal, Conjunctiva are pink and non-injected, sclera clear OROPHARYNX: no exudate, no erythema and lips, buccal mucosa, and tongue normal  NECK: supple, thyroid normal size, non-tender, without nodularity LYMPH: no palpable lymphadenopathy in the  cervical, axillary or inguinal LUNGS: clear to auscultation and percussion with normal breathing effort HEART: regular rate & rhythm and no murmurs and no lower extremity edema ABDOMEN: abdomen soft, non-tender and normal bowel sounds MUSCULOSKELETAL: no cyanosis of digits and no clubbing  NEURO: alert & oriented x 3 with fluent speech, no focal motor/sensory deficits EXTREMITIES: No lower extremity edema  LABORATORY DATA:  I have reviewed the data as listed CMP Latest Ref Rng & Units 02/23/2019 02/21/2019 02/20/2019  Glucose 65 - 99 mg/dL 119(H) 153(H) 172(H)  BUN 8 - 27 mg/dL 33(H) 29(H) 24(H)  Creatinine 0.57 - 1.00 mg/dL 2.40(H) 2.99(H)  3.00(H)  Sodium 134 - 144 mmol/L 142 141 141  Potassium 3.5 - 5.2 mmol/L 4.0 3.4(L) 3.4(L)  Chloride 96 - 106 mmol/L 105 105 103  CO2 20 - 29 mmol/L _0 Calcium 8.7 - 10.3 mg/dL 8.4(L) 8.4(L) 8.5(L)  Total Protein 6.0 - 8.5 g/dL - - -  Total Bilirubin 0.0 - 1.2 mg/dL - - -  Alkaline Phos 39 - 117 IU/L - - -  AST 0 - 40 IU/L - - -  ALT 0 - 32 IU/L - - -    Lab Results  Component Value Date   WBC 6.7 02/27/2019   HGB 8.7 (L) 02/27/2019   HCT 29.8 (L) 02/27/2019   MCV 100.0 02/27/2019   PLT 118 (L) 02/27/2019   NEUTROABS 5.2 02/27/2019    ASSESSMENT & PLAN:  Anemia of chronic disease Patient has had longstanding anemia where the hemoglobin was between 10 to 12 g. Since April 2020 her hemoglobin has gotten worse and today it is at 6.6. During the same timeframe her creatinine got from 8.5-9.29 W44 folic acid and iron studies were normal. 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  Current treatment:Retacrit injectionsweekly started 01/09/2019 Lab review: Hemoglobin is 8.7 stable  Return to clinic weekly for retacrit injections. I discussed with her that if the retacrit injection does not maintain her hemoglobin then we may have to consider doing a bone marrow biopsy. She tells me that long time ago she had a bone marrow biopsy with Dr. Arnoldo Morale.  Hospitalization for CHF and CKD: Treated with diuresis and improved. Ultrasound of the abdomen shows evidence of prior gallbladder surgery and a cyst mild ascites and right pleural effusion.  Breast cancer: Doing well with exemestane.  Continue to closely monitor her blood counts.  She will come weekly for injections and I will see her back in 4 weeks.    No orders of the defined types were placed in this encounter.  The patient has a good understanding of the overall plan. she agrees with it. she will call with any problems that may develop before the next  visit here.  Nicholas Lose, MD 02/27/2019  Hannah [name] am acting as scribe for Dr. Nicholas Lose.  I have reviewed the above documentation for accuracy and completeness, and I agree with the above.

## 2019-02-27 ENCOUNTER — Inpatient Hospital Stay: Payer: Medicare Other

## 2019-02-27 ENCOUNTER — Other Ambulatory Visit: Payer: Self-pay

## 2019-02-27 ENCOUNTER — Inpatient Hospital Stay (HOSPITAL_BASED_OUTPATIENT_CLINIC_OR_DEPARTMENT_OTHER): Payer: Medicare Other | Admitting: Hematology and Oncology

## 2019-02-27 DIAGNOSIS — D631 Anemia in chronic kidney disease: Secondary | ICD-10-CM | POA: Diagnosis not present

## 2019-02-27 DIAGNOSIS — D638 Anemia in other chronic diseases classified elsewhere: Secondary | ICD-10-CM

## 2019-02-27 DIAGNOSIS — C50211 Malignant neoplasm of upper-inner quadrant of right female breast: Secondary | ICD-10-CM

## 2019-02-27 DIAGNOSIS — N184 Chronic kidney disease, stage 4 (severe): Secondary | ICD-10-CM | POA: Diagnosis not present

## 2019-02-27 DIAGNOSIS — Z79899 Other long term (current) drug therapy: Secondary | ICD-10-CM | POA: Diagnosis not present

## 2019-02-27 DIAGNOSIS — I129 Hypertensive chronic kidney disease with stage 1 through stage 4 chronic kidney disease, or unspecified chronic kidney disease: Secondary | ICD-10-CM | POA: Diagnosis present

## 2019-02-27 LAB — CBC WITH DIFFERENTIAL (CANCER CENTER ONLY)
Abs Immature Granulocytes: 0.01 10*3/uL (ref 0.00–0.07)
Basophils Absolute: 0.1 10*3/uL (ref 0.0–0.1)
Basophils Relative: 1 %
Eosinophils Absolute: 0.2 10*3/uL (ref 0.0–0.5)
Eosinophils Relative: 2 %
HCT: 29.8 % — ABNORMAL LOW (ref 36.0–46.0)
Hemoglobin: 8.7 g/dL — ABNORMAL LOW (ref 12.0–15.0)
Immature Granulocytes: 0 %
Lymphocytes Relative: 8 %
Lymphs Abs: 0.5 10*3/uL — ABNORMAL LOW (ref 0.7–4.0)
MCH: 29.2 pg (ref 26.0–34.0)
MCHC: 29.2 g/dL — ABNORMAL LOW (ref 30.0–36.0)
MCV: 100 fL (ref 80.0–100.0)
Monocytes Absolute: 0.7 10*3/uL (ref 0.1–1.0)
Monocytes Relative: 10 %
Neutro Abs: 5.2 10*3/uL (ref 1.7–7.7)
Neutrophils Relative %: 79 %
Platelet Count: 118 10*3/uL — ABNORMAL LOW (ref 150–400)
RBC: 2.98 MIL/uL — ABNORMAL LOW (ref 3.87–5.11)
RDW: 14.6 % (ref 11.5–15.5)
WBC Count: 6.7 10*3/uL (ref 4.0–10.5)
nRBC: 0 % (ref 0.0–0.2)

## 2019-02-27 LAB — SAMPLE TO BLOOD BANK

## 2019-02-27 MED ORDER — EPOETIN ALFA-EPBX 40000 UNIT/ML IJ SOLN
40000.0000 [IU] | Freq: Once | INTRAMUSCULAR | Status: AC
  Administered 2019-02-27: 40000 [IU] via SUBCUTANEOUS
  Filled 2019-02-27: qty 1

## 2019-02-27 NOTE — Assessment & Plan Note (Signed)
Patient has had longstanding anemia where the hemoglobin was between 10 to 12 g. Since April 2020 her hemoglobin has gotten worse and today it is at 6.6. During the same timeframe her creatinine got from 2.7-0.62 B76 folic acid and iron studies were normal. 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  Current treatment:Retacrit injectionsweekly started 01/09/2019 Lab review: Hemoglobin is 8.7 stable  Return to clinic weekly for retacrit injections. I discussed with her that if the retacrit injection does not maintain her hemoglobin then we may have to consider doing a bone marrow biopsy. She tells me that long time ago she had a bone marrow biopsy with Dr. Arnoldo Morale.   Breast cancer: Doing well with exemestane.  Continue to closely monitor her blood counts.  She will come weekly for injections and I will see her back in 4 weeks.

## 2019-02-27 NOTE — Patient Instructions (Signed)

## 2019-03-02 ENCOUNTER — Telehealth: Payer: Self-pay | Admitting: Hematology and Oncology

## 2019-03-02 ENCOUNTER — Telehealth: Payer: Self-pay | Admitting: Gastroenterology

## 2019-03-02 NOTE — Telephone Encounter (Signed)
Called and spoke with patient- patient is requesting to know if Gatorade Zero or Crystal light can be used instead of regular Gatorade for er Miralax spilt prep-patient is diabetic and was advised to use either one and to make sure she follows directions on the insulin/diabetes medications; patient verbalized understanding of information/instructions; patient advised to call back to the office should further questions/concerns arise;

## 2019-03-02 NOTE — Telephone Encounter (Signed)
I left a message regarding schedule  

## 2019-03-02 NOTE — Telephone Encounter (Signed)
Pt is using miralax and gatorate for her prep, she needs to know if gatorate would increase her sugar levels as she is diabetic. Pls call her.

## 2019-03-04 ENCOUNTER — Emergency Department (HOSPITAL_COMMUNITY): Payer: Medicare Other

## 2019-03-04 ENCOUNTER — Other Ambulatory Visit: Payer: Self-pay

## 2019-03-04 ENCOUNTER — Ambulatory Visit
Admission: RE | Admit: 2019-03-04 | Discharge: 2019-03-04 | Disposition: A | Discharge status: Home / Self Care | Payer: Medicare Other | Source: Ambulatory Visit | Attending: Nurse Practitioner | Admitting: Nurse Practitioner

## 2019-03-04 ENCOUNTER — Encounter (HOSPITAL_COMMUNITY): Payer: Self-pay

## 2019-03-04 ENCOUNTER — Ambulatory Visit: Payer: Medicare Other | Admitting: Nurse Practitioner

## 2019-03-04 ENCOUNTER — Emergency Department (HOSPITAL_COMMUNITY)
Admission: EM | Admit: 2019-03-04 | Discharge: 2019-03-04 | Disposition: A | Discharge status: Home / Self Care | Payer: Medicare Other | Attending: Emergency Medicine | Admitting: Emergency Medicine

## 2019-03-04 DIAGNOSIS — Z794 Long term (current) use of insulin: Secondary | ICD-10-CM | POA: Diagnosis not present

## 2019-03-04 DIAGNOSIS — I252 Old myocardial infarction: Secondary | ICD-10-CM | POA: Insufficient documentation

## 2019-03-04 DIAGNOSIS — J45909 Unspecified asthma, uncomplicated: Secondary | ICD-10-CM | POA: Insufficient documentation

## 2019-03-04 DIAGNOSIS — J90 Pleural effusion, not elsewhere classified: Secondary | ICD-10-CM

## 2019-03-04 DIAGNOSIS — E1122 Type 2 diabetes mellitus with diabetic chronic kidney disease: Secondary | ICD-10-CM | POA: Insufficient documentation

## 2019-03-04 DIAGNOSIS — I2510 Atherosclerotic heart disease of native coronary artery without angina pectoris: Secondary | ICD-10-CM | POA: Diagnosis not present

## 2019-03-04 DIAGNOSIS — Z7901 Long term (current) use of anticoagulants: Secondary | ICD-10-CM | POA: Diagnosis not present

## 2019-03-04 DIAGNOSIS — I5032 Chronic diastolic (congestive) heart failure: Secondary | ICD-10-CM | POA: Diagnosis not present

## 2019-03-04 DIAGNOSIS — X58XXXA Exposure to other specified factors, initial encounter: Secondary | ICD-10-CM | POA: Diagnosis not present

## 2019-03-04 DIAGNOSIS — Y999 Unspecified external cause status: Secondary | ICD-10-CM | POA: Diagnosis not present

## 2019-03-04 DIAGNOSIS — I130 Hypertensive heart and chronic kidney disease with heart failure and stage 1 through stage 4 chronic kidney disease, or unspecified chronic kidney disease: Secondary | ICD-10-CM | POA: Insufficient documentation

## 2019-03-04 DIAGNOSIS — Z8673 Personal history of transient ischemic attack (TIA), and cerebral infarction without residual deficits: Secondary | ICD-10-CM | POA: Diagnosis not present

## 2019-03-04 DIAGNOSIS — S0101XA Laceration without foreign body of scalp, initial encounter: Secondary | ICD-10-CM | POA: Diagnosis not present

## 2019-03-04 DIAGNOSIS — Y92530 Ambulatory surgery center as the place of occurrence of the external cause: Secondary | ICD-10-CM | POA: Diagnosis not present

## 2019-03-04 DIAGNOSIS — Y9301 Activity, walking, marching and hiking: Secondary | ICD-10-CM | POA: Insufficient documentation

## 2019-03-04 DIAGNOSIS — Z951 Presence of aortocoronary bypass graft: Secondary | ICD-10-CM | POA: Diagnosis not present

## 2019-03-04 DIAGNOSIS — N184 Chronic kidney disease, stage 4 (severe): Secondary | ICD-10-CM | POA: Insufficient documentation

## 2019-03-04 DIAGNOSIS — Z91040 Latex allergy status: Secondary | ICD-10-CM | POA: Diagnosis not present

## 2019-03-04 DIAGNOSIS — T148XXA Other injury of unspecified body region, initial encounter: Secondary | ICD-10-CM

## 2019-03-04 DIAGNOSIS — Z79899 Other long term (current) drug therapy: Secondary | ICD-10-CM | POA: Insufficient documentation

## 2019-03-04 DIAGNOSIS — S0990XA Unspecified injury of head, initial encounter: Secondary | ICD-10-CM | POA: Diagnosis present

## 2019-03-04 LAB — COMPREHENSIVE METABOLIC PANEL
ALT: 27 U/L (ref 0–44)
AST: 49 U/L — ABNORMAL HIGH (ref 15–41)
Albumin: 2.3 g/dL — ABNORMAL LOW (ref 3.5–5.0)
Alkaline Phosphatase: 65 U/L (ref 38–126)
Anion gap: 10 (ref 5–15)
BUN: 35 mg/dL — ABNORMAL HIGH (ref 8–23)
CO2: 26 mmol/L (ref 22–32)
Calcium: 8.8 mg/dL — ABNORMAL LOW (ref 8.9–10.3)
Chloride: 100 mmol/L (ref 98–111)
Creatinine, Ser: 2.32 mg/dL — ABNORMAL HIGH (ref 0.44–1.00)
GFR calc Af Amer: 23 mL/min — ABNORMAL LOW (ref >60)
GFR calc non Af Amer: 20 mL/min — ABNORMAL LOW (ref >60)
Glucose, Bld: 155 mg/dL — ABNORMAL HIGH (ref 70–99)
Potassium: 4.4 mmol/L (ref 3.5–5.1)
Sodium: 136 mmol/L (ref 135–145)
Total Bilirubin: 0.8 mg/dL (ref 0.3–1.2)
Total Protein: 6.1 g/dL — ABNORMAL LOW (ref 6.5–8.1)

## 2019-03-04 LAB — PROTIME-INR
INR: 1.9 — ABNORMAL HIGH (ref 0.8–1.2)
Prothrombin Time: 21.4 seconds — ABNORMAL HIGH (ref 11.4–15.2)

## 2019-03-04 LAB — CBC WITH DIFFERENTIAL/PLATELET
Abs Immature Granulocytes: 0.03 10*3/uL (ref 0.00–0.07)
Basophils Absolute: 0.1 10*3/uL (ref 0.0–0.1)
Basophils Relative: 1 %
Eosinophils Absolute: 0.1 10*3/uL (ref 0.0–0.5)
Eosinophils Relative: 2 %
HCT: 30.7 % — ABNORMAL LOW (ref 36.0–46.0)
Hemoglobin: 8.9 g/dL — ABNORMAL LOW (ref 12.0–15.0)
Immature Granulocytes: 0 %
Lymphocytes Relative: 11 %
Lymphs Abs: 0.8 10*3/uL (ref 0.7–4.0)
MCH: 28.9 pg (ref 26.0–34.0)
MCHC: 29 g/dL — ABNORMAL LOW (ref 30.0–36.0)
MCV: 99.7 fL (ref 80.0–100.0)
Monocytes Absolute: 0.9 10*3/uL (ref 0.1–1.0)
Monocytes Relative: 13 %
Neutro Abs: 5.2 10*3/uL (ref 1.7–7.7)
Neutrophils Relative %: 73 %
Platelets: 178 10*3/uL (ref 150–400)
RBC: 3.08 MIL/uL — ABNORMAL LOW (ref 3.87–5.11)
RDW: 14.8 % (ref 11.5–15.5)
WBC: 7.2 10*3/uL (ref 4.0–10.5)
nRBC: 0 % (ref 0.0–0.2)

## 2019-03-04 LAB — BRAIN NATRIURETIC PEPTIDE: B Natriuretic Peptide: 273.4 pg/mL — ABNORMAL HIGH (ref 0.0–100.0)

## 2019-03-04 NOTE — Discharge Instructions (Signed)
Follow up with your family doc.  Return for continued bleeding or confusion.

## 2019-03-04 NOTE — ED Provider Notes (Signed)
[facility] EMERGENCY DEPARTMENT Provider Note   CSN: [id number] Arrival date & time: 03/04/19  1418     History   Chief Complaint Chief Complaint  Patient presents with  . Dizziness    HPI Hannah [name] is a 73 y.o. female.     HPI Patient presents after a fall.  Patient has multiple medical issues, was recently discharged from this facility, and today was at the planned outpatient follow-up visit. She notes that while walking she had an episode of brief lightheadedness, fell, striking her head. There is no loss of consciousness, and since the event she has had only new pain in her occiput.  Pain is mild, nonradiating. No new confusion, disorientation, neck pain, chest pain, or any other complaints per Patient does take Xarelto for history of arrhythmia, has been compliant with his medication. She is here with her husband who assists with the HPI.  Past Medical History:  Diagnosis Date  . Abnormally small mouth   . Allergic rhinitis 10/14/2009   Qualifier: Diagnosis of  By: Lamonte Sakai MD, Rose Fillers   Overview:  Overview:  Qualifier: Diagnosis of  By: Lamonte Sakai MD, Rose Fillers  Last Assessment & Plan:  Please continue Xyzal and Nasacort as you have been using them  . Anemia   . Asthma 05/12/2009   10/12/2014 p extensive coaching HFA effectiveness =    75% s spacer    Overview:  Overview:  10/12/2014 p extensive coaching HFA effectiveness =    75% s spacer   Last Assessment & Plan:  Please continue Symbicort 2 puffs twice a day. Remember to rinse and gargle after taking this medication. Take albuterol 2 puffs up to every 4 hours if needed for shortness of breath.  Follow with Dr Lamonte Sakai in 6 month  . Bilateral carotid artery stenosis    a. mild 1-39% by duplex 10/2017, due 2021.  . Breast cancer ([place]) 01/18/2016   right breast  . CAD (coronary artery disease) 01/24/2011   a. s/p CABG 1998 with redo 2009.  Marland Kitchen Chemotherapy induced nausea and vomiting 02/24/2016  .  Chemotherapy-induced peripheral neuropathy ([place]) 04/27/2016  . 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 Chronic diastolic CHF (congestive heart failure) ([place]) 09/24/2013  . CKD (chronic kidney disease), stage IV ([place]) 09/24/2013  . Closed fracture of head of left humerus 09/09/2017  . Complication of anesthesia   . Essential hypertension 05/12/2009   Qualifier: Diagnosis of  By: Lamonte Sakai MD, Rose Fillers   . Gallstones   . GERD (gastroesophageal reflux disease)   . Glaucoma   . H/O atrial tachycardia 05/12/2009   Qualifier: History of  By: Lamonte Sakai MD, Rose Fillers   Overview:  Overview:  Qualifier: History of  By: Lamonte Sakai MD, Rose Fillers  Last Assessment & Plan:  There was no evidence of this on her cardiac monitor. I would recommend watchful waiting.   Marland Kitchen History of non-ST elevation myocardial infarction (NSTEMI)    Sept 2014--  thought to be type II HTN w/ LHC without infarct related artery and patent grafts  . History of radiation therapy 05/24/16-07/26/16   right breast 50.4 Gy in 28 fractions, right breast boost 10 Gy in 5 fractions  . Hyperlipidemia   . Hypertension    Resolved.  . Iron deficiency anemia   . Mania ([place]) 03/05/2016  . Moderate persistent asthma    pulmologist-  Dr. Malvin Johns  . Osteomyelitis of toe of left foot ([place]) 04/06/2016  . Osteopenia of multiple sites 10/19/2015  .  PAF (paroxysmal atrial fibrillation) ([place])   . Personal history of chemotherapy 2017  . Personal history of radiation therapy 2017  . PONV (postoperative nausea and vomiting)   . Port catheter in place 02/17/2016  . Psoriasis    right leg  . Renal calculus, right   . S/P AVR    prosthesis valve placement 2009 at same time re-do CABG  . Seizure-like activity ([place]) 09/01/2017  . Sensorineural hearing loss (SNHL) of both ears 01/06/2016  . Stroke [facility])    residual rt hearing loss  . Syncope 09/06/2017  . Type 2 diabetes mellitus ([place])    monitored by dr Dwyane Dee    Patient Active Problem List   Diagnosis Date  Noted  . Hypokalemia 02/20/2019  . Acute exacerbation of CHF (congestive heart failure) ([place]) 02/19/2019  . Pleural effusion on right 09/25/2018  . Thrombocytopenia ([place]) 09/25/2018  . Cholecystitis with cholelithiasis 07/07/2018  . Prolonged Q-T interval on ECG 05/21/2018  . Atrial fibrillation ([place]) 01/23/2018  . Closed fracture of head of left humerus 09/09/2017  . Syncope 09/06/2017  . Seizure-like activity ([place]) 09/01/2017  . AKI (acute kidney injury) ([place]) 09/01/2017  . Adrenal insufficiency ([place]) 07/03/2016  . Seizure ([place]) 06/29/2016  . Chemotherapy-induced peripheral neuropathy ([place]) 04/27/2016  . 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 Osteomyelitis of toe of left foot ([place]) 04/06/2016  . Mania ([place]) 03/05/2016  . GERD (gastroesophageal reflux disease) 02/29/2016  . Chemotherapy induced nausea and vomiting 02/24/2016  . Port catheter in place 02/17/2016  . Sensorineural hearing loss (SNHL) of both ears 01/06/2016  . Breast cancer of upper-inner quadrant of right female breast ([place]) 12/16/2015  . Osteopenia of multiple sites 10/19/2015  . CKD (chronic kidney disease), stage IV ([place]) 09/24/2013  . Chronic diastolic CHF (congestive heart failure) ([place]) 09/24/2013  . Acute on chronic combined systolic and diastolic congestive heart failure ([place]) 07/17/2013  . Atrial tachycardia ([place]) 04/03/2013  . CAD (coronary artery disease) 01/24/2011  . Chronic coronary artery disease 01/24/2011  . History of aortic valve replacement 10/27/2010  . Hyperlipidemia   . DOE (dyspnea on exertion)   . Nonischemic cardiomyopathy ([place])   . Anemia of chronic disease   . Type 2 diabetes mellitus ([place])   . Iron deficiency anemia   . Allergic rhinitis 10/14/2009  . Essential hypertension 05/12/2009  . H/O atrial tachycardia 05/12/2009  . Asthma 05/12/2009    Past Surgical History:  Procedure Laterality Date  . AORTIC VALVE REPLACEMENT  2009   [facility] (done same time is CABG)  . BREAST LUMPECTOMY Right 01/18/2016  . BREAST LUMPECTOMY WITH RADIOACTIVE SEED AND SENTINEL LYMPH NODE BIOPSY Right 01/18/2016   Procedure: RIGHT BREAST LUMPECTOMY WITH RADIOACTIVE SEED AND SENTINEL LYMPH NODE BIOPSY;  Surgeon: DAlphonsa Overall MD;  [facility]  Service: General;  Laterality: Right;  . CARDIAC CATHETERIZATION  03/23/2008   Pre-redo CABG: L main OK, LAD (T), CFX (T), OM1 99%, RCA (T), LIMA-LAD OK, SVG-OM(?3) OK w/ little florw to OM2, SVG-RCA OK. EF NL  . CARPAL TUNNEL RELEASE    . CHOLECYSTECTOMY N/A 07/07/2018   Procedure: LAPAROSCOPIC CHOLECYSTECTOMY WITH INTRAOPERATIVE CHOLANGIOGRAM ERAS PATHWAY;  Surgeon: NAlphonsa Overall MD;  Location: [facility];  Service: General;  Laterality: N/A;  . COLONOSCOPY     around 2015. Possibly with [facility]   . CORONARY ARTERY BYPASS GRAFT  1998 &  re-do 2009   Had LIMA to DX/LAD, SVG to 2 marginal branches and [facility]  [facility]; SVG to 3rd OM and PD at time of redo  . CYSTOSCOPY W/ URETERAL STENT PLACEMENT Right 12/20/2014   Procedure: CYSTOSCOPY WITH RETROGRADE PYELOGRAM/URETERAL STENT PLACEMENT;  Surgeon: Cleon Gustin, MD;  [facility];  Service: Urology;  Laterality: Right;  . ESOPHAGOGASTRODUODENOSCOPY     many years ago per patient   . ESOPHAGOGASTRODUODENOSCOPY ENDOSCOPY  06/17/2018  . EYE SURGERY Bilateral    cataracts  . HOLMIUM LASER APPLICATION Right 0/62/3762   Procedure:  HOLMIUM LASER LITHOTRIPSY;  Surgeon: Cleon Gustin, MD;  Location[facility];  Service: Urology;  Laterality: Right;  . IR THORACENTESIS ASP PLEURAL SPACE W/IMG GUIDE  10/03/2018  . LEFT HEART CATHETERIZATION WITH CORONARY/GRAFT ANGIOGRAM N/A 02/23/2013   Procedure: LEFT HEART CATHETERIZATION WITH Beatrix Fetters;  Surgeon: Blane Ohara, MD;  Location: [facility] CATH LAB;  Service: Cardiovascular;  Laterality: N/A;  . LOOP RECORDER INSERTION N/A 08/30/2017    Procedure: LOOP RECORDER INSERTION;  Surgeon: Evans Lance, MD;  Location: [facility] CV LAB;  Service: Cardiovascular;  Laterality: N/A;  . PORTACATH PLACEMENT Left 01/18/2016   Procedure: INSERTION PORT-A-CATH;  Surgeon: Alphonsa Overall, MD;  Location: [facility];  Service: General;  Laterality: Left;  . portacath removal    . TONSILLECTOMY    . TRANSTHORACIC ECHOCARDIOGRAM  02-24-2013      mild LVH,  ef 50-55%/  AV bioprosthesis was present with very mild stenosis and no regurg., mean grandient 26mHg, peak grandient 247mg /  mild MR/  mild LAE and RAE/  moderate TR  . TUBAL LIGATION       OB History   No obstetric history on file.      Home Medications    Prior to Admission medications   Medication Sig Start Date End Date Taking? Authorizing Provider  ACCU-CHEK GUIDE test strip Use to test blood sugar 3 times daily 10/21/18   KuElayne SnareMD  acetaminophen (TYLENOL) 325 MG tablet Take 2 tablets (650 mg total) by mouth every 6 (six) hours as needed for mild pain (or Fever >/= 101). 09/03/17   VaEulogio Bear, DO  albuterol (PROVENTIL HFA;VENTOLIN HFA) 108 (90 Base) MCG/ACT inhaler Inhale 2 puffs into the lungs every 6 (six) hours as needed for wheezing or shortness of breath.    [provider]  amiodarone (PACERONE) 200 MG tablet Take 1 tablet (200 mg total) by mouth daily. 02/03/19   TaEvans LanceMD  Blood Glucose Monitoring Suppl (ACCU-CHEK AVIVA PLUS) w/Device KIT Use accu chek aviva meter to check blood sugar three times daily. 02/13/19   KuElayne SnareMD  budesonide-formoterol (SE[facility]160-4.5 MCG/ACT inhaler INHALE TWO PUFFS INTO THE LUNGS TWICE DAILY 09/18/18   ByCollene GobbleMD  ELIQUIS 5 MG TABS tablet TAKE ONE TABLET BY MOUTH TWICE DAILY 02/03/19   TaEvans LanceMD  epoetin alfa-epbx (RETACRIT) 4083151NIT/ML injection Inject 40,000 Units into the skin every Friday.    [provider]  exemestane (AROMASIN) 25 MG tablet TAKE ONE TABLET BY MOUTH DAILY AFTER  BREAKFAST 09/17/18   GuNicholas LoseMD  ezetimibe (ZETIA) 10 MG tablet TAKE ONE TABLET BY MOUTH EVERY DAY 10/29/18   TaEvans LanceMD  famotidine (PEPCID) 20 MG tablet One after supper 09/25/18   WeTanda RockersMD  ferrous sulfate 325 (65 FE) MG EC tablet Take 1 tablet (325 mg total) by mouth 2 (two) times daily. 02/21/19 03/23/19  AkHosie PoissonMD  furosemide (LASIX) 40  MG tablet Take 1 tablet (40 mg total) by mouth daily. 02/24/19 02/24/20  [name], NP  insulin glargine (LANTUS) 100 UNIT/ML injection Inject 16 units under the skin once daily in the morning. 09/01/18   Elayne Snare, MD  insulin lispro (HUMALOG) 100 UNIT/ML injection Inject 4-6 units under the skin three times daily before meals. 09/04/18   Elayne Snare, MD  Insulin Pen Needle 32G X 4 MM MISC Use as instructed to inject insulin 4 times daily. 09/02/18   Elayne Snare, MD  loratadine (CLARITIN) 10 MG tablet Take 10 mg by mouth daily.    [provider]  metoprolol tartrate (LOPRESSOR) 25 MG tablet Take 1 tablet (25 mg total) by mouth 2 (two) times daily. 12/24/18 03/24/19  [name], NP  nitroGLYCERIN (NITROSTAT) 0.4 MG SL tablet Place 1 tablet (0.4 mg total) under the tongue every 5 (five) minutes as needed for chest pain. 09/19/18 02/19/19  Evans Lance, MD  potassium chloride SA (K-DUR) 20 MEQ tablet Take 1 tablet (20 mEq total) by mouth daily. 02/23/19   [name], NP  rosuvastatin (CRESTOR) 40 MG tablet Take 1 tablet (40 mg total) by mouth at bedtime. 09/08/18   [name], NP  zolpidem (AMBIEN) 5 MG tablet TAKE ONE TABLET BY MOUTH AT BEDTIME AS NEEDED FOR SLEEP 12/06/18   Saguier, Percell Miller, PA-C    Family History Family History  Problem Relation Age of Onset  . Heart disease Father   . Heart failure Father   . Diabetes Maternal Grandmother   . Heart disease Maternal Grandmother   . Diabetes Son   . Healthy Brother        #1  . Heart attack Brother        #2  . Heart disease Brother        #2   . Colon cancer Neg Hx   . Esophageal cancer Neg Hx     Social History Social History   Tobacco Use  . Smoking status: Never Smoker  . Smokeless tobacco: Never Used  Substance Use Topics  . Alcohol use: No  . Drug use: No     Allergies   Amoxicillin, Tape, Aldactone [spironolactone], Imdur [isosorbide dinitrate], Arimidex [anastrozole], Latex, and Tetracycline   Review of Systems Review of Systems  Constitutional:       Per HPI, otherwise negative  HENT:       Per HPI, otherwise negative  Respiratory:       Per HPI, otherwise negative  Cardiovascular:       Per HPI, otherwise negative  Gastrointestinal: Negative for vomiting.  Endocrine:       Negative aside from HPI  Genitourinary:       Neg aside from HPI   Musculoskeletal:       Per HPI, otherwise negative  Skin: Positive for wound.  Neurological: Negative for syncope.  Hematological: Bruises/bleeds easily.     Physical Exam Updated Vital Signs BP (!) 121/59   Pulse 61   Resp 15   Ht _0  (1.651 m)   Wt 68 kg   SpO2 100%   BMI 24.96 kg/m   Physical Exam Vitals signs and nursing note reviewed.  Constitutional:      General: She is not in acute distress.    Appearance: She is well-developed.  HENT:     Head: Normocephalic.   Eyes:     Conjunctiva/sclera: Conjunctivae normal.  Neck:     Musculoskeletal: No spinous process  tenderness or muscular tenderness.  Cardiovascular:     Rate and Rhythm: Normal rate and regular rhythm.  Pulmonary:     Effort: Pulmonary effort is normal. No respiratory distress.     Breath sounds: Normal breath sounds. No stridor.  Abdominal:     General: There is no distension.  Skin:    General: Skin is warm and dry.  Neurological:     General: No focal deficit present.     Mental Status: She is alert and oriented to person, place, and time.     Cranial Nerves: No cranial nerve deficit.      ED Treatments / Results  Labs (all labs ordered are listed, but  only abnormal results are displayed) Labs Reviewed  COMPREHENSIVE METABOLIC PANEL - Abnormal; Notable for the following components:      Result Value   Glucose, Bld 155 (*)    BUN 35 (*)    Creatinine, Ser 2.32 (*)    Calcium 8.8 (*)    Total Protein 6.1 (*)    Albumin 2.3 (*)    AST 49 (*)    GFR calc non Af Amer 20 (*)    GFR calc Af Amer 23 (*)    All other components within normal limits  CBC WITH DIFFERENTIAL/PLATELET - Abnormal; Notable for the following components:   RBC 3.08 (*)    Hemoglobin 8.9 (*)    HCT 30.7 (*)    MCHC 29.0 (*)    All other components within normal limits  PROTIME-INR - Abnormal; Notable for the following components:   Prothrombin Time 21.4 (*)    INR 1.9 (*)    All other components within normal limits  BRAIN NATRIURETIC PEPTIDE - Abnormal; Notable for the following components:   B Natriuretic Peptide 273.4 (*)    All other components within normal limits    EKG EKG Interpretation  Date/Time:  Wednesday March 04 2019 14:27:48 EDT Ventricular Rate:  64 PR Interval:    QRS Duration: 102 QT Interval:  481 QTC Calculation: 497 R Axis:   -19 Text Interpretation:  Junctional rhythm Abnormal R-wave progression, early transition Probable left ventricular hypertrophy No significant change since last tracing Confirmed by Deno Etienne [phone]) on 03/04/2019 4:38:15 PM   Radiology No results found.  Procedures Procedures (including critical care time) .LACERATION REPAIR Performed by: Carmin Muskrat Authorized by: Carmin Muskrat Consent: Verbal consent obtained. Risks and benefits: risks, benefits and alternatives were discussed Consent given by: patient Patient identity confirmed: provided demographic data Prepped and Draped in normal sterile fashion Wound explored  Laceration Location: scalp  Laceration Length: 4cm  No Foreign Bodies seen or palpated  Irrigation method: syringe Amount of cleaning: standard  Skin closure: staples   Number of staples:3  Technique: close  Patient tolerance: Patient tolerated the procedure well with no immediate complications.   Medications Ordered in ED Medications - No data to display   Initial Impression / Assessment and Plan / ED Course  I have reviewed the triage vital signs and the nursing notes.  Pertinent labs & imaging results that were available during my care of the patient were reviewed by me and considered in my medical decision making (see chart for details).       On repeat exam patient is in similar condition, after staples were placed, pressure applied, she has had no ongoing bleeding. This elderly female presents after mechanical fall.  On notably, patient has ongoing evaluation with her cardiology colleagues, and was returning from  outpatient visit when she fell today. Patient is adamant about not staying here for additional evaluation should she not be found to have substantial abnormalities. Initial findings are reassuring, with preserved neurovascular status, unremarkable blood pressure, but on signout the patient was awaiting CT scan, and repeat evaluation.   Note: Next day, chart review notable for demonstration of repeat evaluation demonstrating persistent bleeding from her head wound, to which the patient was differential about additional intervention. CT reassuring.  Final Clinical Impressions(s) / ED Diagnoses   Final diagnoses:  Hematoma  Scalp laceration, initial encounter      Carmin Muskrat, MD 03/05/19 2201

## 2019-03-04 NOTE — ED Provider Notes (Signed)
73 yo F with a cc of fall.  Patient struck her head.  Plan is for lab work and a CT scan.  If these are at the patient's baseline and then would discharge the patient home.  I received her in signout from Dr. Vanita Panda.  Patient's labs have returned and are at her baseline.  BMP is at baseline.  Renal dysfunction at baseline.  No significant change to her anemia.     On my reassessment the patient has a bandage to her left occipital area that has bled through.  I remove this and the patient has some brisk bleeding at the superior end of 1 of the staples.  I discussed different options to stop the bleeding.  The patient at this time is declining any injection or figure-of-eight suturing.  Patient would prefer to hold pressure.  She would not like to wait and see if it stopped.  She is planning on going home at this time.  We will have her follow-up with her family doctor.  We will have her return for worsening bleeding.     Deno Etienne, DO 03/04/19 1905

## 2019-03-04 NOTE — ED Triage Notes (Addendum)
Pt brought in by ems; on her way to cardiologist, got dizzy, fell and hit back of head while walking into office; pt on eliquis; denies LOC; 1.5 inch hematoma w/ bleeding to back of head; denies neck or back pain, tingling; GCS 15; pt endorses orthostatic issues at baseline; c/o L elbow pain and pain to back of head  146/56 CBG 180 HR 64 RR 16 97% RA 98 F

## 2019-03-05 ENCOUNTER — Other Ambulatory Visit: Payer: Self-pay | Admitting: Medical

## 2019-03-05 ENCOUNTER — Inpatient Hospital Stay: Payer: Medicare Other

## 2019-03-05 ENCOUNTER — Other Ambulatory Visit: Payer: Self-pay

## 2019-03-05 ENCOUNTER — Telehealth: Payer: Self-pay | Admitting: *Deleted

## 2019-03-05 ENCOUNTER — Other Ambulatory Visit: Payer: Self-pay | Admitting: *Deleted

## 2019-03-05 VITALS — BP 140/58 | HR 62 | Temp 98.2°F | Resp 18

## 2019-03-05 DIAGNOSIS — D638 Anemia in other chronic diseases classified elsewhere: Secondary | ICD-10-CM

## 2019-03-05 DIAGNOSIS — I129 Hypertensive chronic kidney disease with stage 1 through stage 4 chronic kidney disease, or unspecified chronic kidney disease: Secondary | ICD-10-CM | POA: Diagnosis not present

## 2019-03-05 DIAGNOSIS — C50211 Malignant neoplasm of upper-inner quadrant of right female breast: Secondary | ICD-10-CM

## 2019-03-05 LAB — CBC WITH DIFFERENTIAL (CANCER CENTER ONLY)
Abs Immature Granulocytes: 0.01 10*3/uL (ref 0.00–0.07)
Basophils Absolute: 0.1 10*3/uL (ref 0.0–0.1)
Basophils Relative: 1 %
Eosinophils Absolute: 0.1 10*3/uL (ref 0.0–0.5)
Eosinophils Relative: 2 %
HCT: 29.5 % — ABNORMAL LOW (ref 36.0–46.0)
Hemoglobin: 8.6 g/dL — ABNORMAL LOW (ref 12.0–15.0)
Immature Granulocytes: 0 %
Lymphocytes Relative: 9 %
Lymphs Abs: 0.6 10*3/uL — ABNORMAL LOW (ref 0.7–4.0)
MCH: 28.9 pg (ref 26.0–34.0)
MCHC: 29.2 g/dL — ABNORMAL LOW (ref 30.0–36.0)
MCV: 99 fL (ref 80.0–100.0)
Monocytes Absolute: 0.7 10*3/uL (ref 0.1–1.0)
Monocytes Relative: 11 %
Neutro Abs: 5 10*3/uL (ref 1.7–7.7)
Neutrophils Relative %: 77 %
Platelet Count: 165 10*3/uL (ref 150–400)
RBC: 2.98 MIL/uL — ABNORMAL LOW (ref 3.87–5.11)
RDW: 14.9 % (ref 11.5–15.5)
WBC Count: 6.5 10*3/uL (ref 4.0–10.5)
nRBC: 0 % (ref 0.0–0.2)

## 2019-03-05 LAB — SAMPLE TO BLOOD BANK

## 2019-03-05 MED ORDER — FUROSEMIDE 40 MG PO TABS: ORAL_TABLET | ORAL | 11 refills | Status: DC

## 2019-03-05 MED ORDER — MIDODRINE HCL 2.5 MG PO TABS: 2.5000 mg | ORAL_TABLET | Freq: Two times a day (BID) | ORAL | 3 refills | Status: DC

## 2019-03-05 MED ORDER — EPOETIN ALFA-EPBX 40000 UNIT/ML IJ SOLN
40000.0000 [IU] | Freq: Once | INTRAMUSCULAR | Status: AC
  Administered 2019-03-05: 40000 [IU] via SUBCUTANEOUS
  Filled 2019-03-05: qty 1

## 2019-03-05 NOTE — Patient Instructions (Signed)

## 2019-03-05 NOTE — Telephone Encounter (Signed)
Refill Request: Zolpidem   Last RX: 12/06/18 Last OV: 07/30/18 Next OV: None scheduled  UDS:11/20/17 CSC:11/20/17 CSR:

## 2019-03-05 NOTE — Telephone Encounter (Signed)
LM for pt to call and schedule appt prior to additional refills

## 2019-03-05 NOTE — Telephone Encounter (Signed)
Pt needs to give uds and sign controlled med contract.  I only gave 2 weeks of ambien. Over next 2 weeks please get her scheduled to sign contract and give uds.

## 2019-03-05 NOTE — Telephone Encounter (Signed)
Pt is due for follow up please call and schedule appointment.  

## 2019-03-05 NOTE — Telephone Encounter (Signed)
S/w pt and pt's husband, advised with Truitt Merle, NP, Pt's CXR results are better.  Pt is down 5 lbs, will alternate lasix (20 mg) with (40 mg ) every other day.  Pt will start midodrine one tablet twice daily. Pt will keep bp log X3 daily.  If pt has any headache, dizziness, nausea, can't stay away, sleepiness,  needs to go to ER due to fall in parking lot yesterday.  Pt was not feeling great last night bp was 101/60 took a midodrine and pt felt better. On phone today bp was 107/56.  Husband stated pt has episodes of deep staring and pt goes limp, pt had a episode last night that lasted 30-40 seconds and a episode last weekend that lasted 1 minute. If this happens again family is to  press button for loop recorder so this can be recorded.  [name] stated either pt's bp is horribly low or pt is having an episode of A-Fib.  Pt is to call office when episode happens so can get loop recorder checked. Pt repeated all instructions back.  Pt has a upcoming appt next week with [name].

## 2019-03-06 ENCOUNTER — Inpatient Hospital Stay: Payer: Medicare Other

## 2019-03-06 ENCOUNTER — Encounter: Payer: Medicare Other | Admitting: Gastroenterology

## 2019-03-06 NOTE — Telephone Encounter (Signed)
Pt is due for follow up please call and schedule appointment.  

## 2019-03-06 NOTE — Progress Notes (Addendum)
CARDIOLOGY OFFICE NOTE  Date:  03/11/2019    Hannah [name] Date of Birth: 05/19/1946 Medical Record #[id number]  PCP:  Mackie Pai, PA-C  Cardiologist:  Atilano Median    Chief Complaint  Patient presents with  . Follow-up    Follow up visit.     History of Present Illness: Hannah [name] is a 73 y.o. female who presents today for what was to be a one week check - now a 2 week check. Seen for Dr. Lovena Le.   She has a very complex history - this includes a history ofCAD s/p CABG (1998 with redo 2009), bioprosthetic AVR in 1194, chronic diastolic CHF, asthma, carotid artery disease (1-39% by duplex 10/2017, due 2021), CKD stage IV, PAF - has been on Multaq in the past and now on amiodarone, atrial tachycardia, breast CA, orthostasis, chronic anemia, thrombocytopenia, mild dilation of ascending aorta by echo 10/2018, recurrent syncope/orthostasis, recurrent pleural effusion requiring thoracentesis4/2020, HTN, GERD, & HLD.   I have followed her for many years - she has had periodic issues with volume overload, orthostasis and tachy palpitations. Last cath in 2014 - grafts patent. Myoview in 2015 without ischemia.  Over the past few months she has had more issues with her pleural effusion - diuretics caused worsening CKD/orthostasis. She has gotten more anemic - back to hematology and now on therapy. Concern for MDS and will be having bone marrow biopsy - also needing colonoscopy due to profound anemia (HGB down to 6 which is highly unusual for [name]). Most recent abdominal ultrasound noted and concerning for liver abnormality. Recent mammogram also recently noted and abnormal - ended up having repeat biopsy.   I saw her about a 6 weeks ago - we had restarted low dose diuretics for recurring pleural effusion - she was better at my visit but I was still quite concerned that there is "something else going on" that has just not been found yet.  She has had progressive failure to  thrive. Seen 3 weeks ago as a work in by [employer], [place] for worsening dyspnea - increasing pleural effusion - worsening CKD - referred to the ED and was then admitted - treated for pneumonia.  We kept our follow up for here 2 weeks ago - she had been home a few days - was doing better - finishing antibiotics but surprised at the diagnosis of pneumonia.    She was getting ready to have colonoscopy. She was seeing Heme/Onc. She was not on Midodrine and had not been orthostatic. She had had an abnormal mammogram - had 4 biopsies which turned out ok. She was not aware of her abdominal US results - possible need for CT scan.   I was going to see her the following week with a CXR. She completed the CXR- this showed improvement in the effusion/pneumonia. She was on her way here - got out of her car and was walking in to the building - got dizzy - and fell - significant laceration to the back of her head. Ended up calling EMS and she was taken to the ER - CT of the head was ok.    Her ILR monitor was noted to be up to date through that morning of the event.  No symptom, tachy, pause, brady, or AF episodes detected in recent months.   We have been in touch by phone - BP has been low - have added the Midodrine and cut the Lasix back.  The patient does not have symptoms concerning for COVID-19 infection (fever, chills, cough, or new shortness of breath).   Comes in today. Here with her husband Hannah [name] today. She is just exhausted. BP staying low. She is on the Midodrine TID.  Needs Crestor and Eliquis refilled. Very fatigued. No more "staring" episodes since we started the Midodrine and cut her Lasix back. She is asking about driving - does not feel like she should. She is not using a walker - probably should be. Family does not want her left alone. Still not really clear about her fall - says there was a dip in the parking lot possibly - she felt dizzy and knew she was going to fall. She did not have LOC.    She did see Dr. Posey Pronto - he thought things were stable with her kidneys. She cancelled her colonoscopy - cannot lie back due to her head laceration.  Past Medical History:  Diagnosis Date  . Abnormally small mouth   . Allergic rhinitis 10/14/2009   Qualifier: Diagnosis of  By: Lamonte Sakai MD, Rose Fillers   Overview:  Overview:  Qualifier: Diagnosis of  By: Lamonte Sakai MD, Rose Fillers  Last Assessment & Plan:  Please continue Xyzal and Nasacort as you have been using them  . Anemia   . Asthma 05/12/2009   10/12/2014 p extensive coaching HFA effectiveness =    75% s spacer    Overview:  Overview:  10/12/2014 p extensive coaching HFA effectiveness =    75% s spacer   Last Assessment & Plan:  Please continue Symbicort 2 puffs twice a day. Remember to rinse and gargle after taking this medication. Take albuterol 2 puffs up to every 4 hours if needed for shortness of breath.  Follow with Dr Lamonte Sakai in 6 month  . Bilateral carotid artery stenosis    a. mild 1-39% by duplex 10/2017, due 2021.  . Breast cancer ([place]) 01/18/2016   right breast  . CAD (coronary artery disease) 01/24/2011   a. s/p CABG 1998 with redo 2009.  Marland Kitchen Chemotherapy induced nausea and vomiting 02/24/2016  . Chemotherapy-induced peripheral neuropathy ([place]) 04/27/2016  . 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 Chronic diastolic CHF (congestive heart failure) ([place]) 09/24/2013  . CKD (chronic kidney disease), stage IV ([place]) 09/24/2013  . Closed fracture of head of left humerus 09/09/2017  . Complication of anesthesia   . Essential hypertension 05/12/2009   Qualifier: Diagnosis of  By: Lamonte Sakai MD, Rose Fillers   . Gallstones   . GERD (gastroesophageal reflux disease)   . Glaucoma   . H/O atrial tachycardia 05/12/2009   Qualifier: History of  By: Lamonte Sakai MD, Rose Fillers   Overview:  Overview:  Qualifier: History of  By: Lamonte Sakai MD, Rose Fillers  Last Assessment & Plan:  There was no evidence of this on her cardiac monitor. I would recommend watchful waiting.   Marland Kitchen History  of non-ST elevation myocardial infarction (NSTEMI)    Sept 2014--  thought to be type II HTN w/ LHC without infarct related artery and patent grafts  . History of radiation therapy 05/24/16-07/26/16   right breast 50.4 Gy in 28 fractions, right breast boost 10 Gy in 5 fractions  . Hyperlipidemia   . Hypertension    Resolved.  . Iron deficiency anemia   . Mania ([place]) 03/05/2016  . Moderate persistent asthma    pulmologist-  Dr. Malvin Johns  . Osteomyelitis of toe of left foot ([place]) 04/06/2016  . Osteopenia of multiple sites 10/19/2015  .  PAF (paroxysmal atrial fibrillation) ([place])   . Personal history of chemotherapy 2017  . Personal history of radiation therapy 2017  . PONV (postoperative nausea and vomiting)   . Port catheter in place 02/17/2016  . Psoriasis    right leg  . Renal calculus, right   . S/P AVR    prosthesis valve placement 2009 at same time re-do CABG  . Seizure-like activity ([place]) 09/01/2017  . Sensorineural hearing loss (SNHL) of both ears 01/06/2016  . Stroke [facility])    residual rt hearing loss  . Syncope 09/06/2017  . Type 2 diabetes mellitus ([place])    monitored by dr Dwyane Dee    Past Surgical History:  Procedure Laterality Date  . AORTIC VALVE REPLACEMENT  2009   [facility]done same time is CABG)  . BREAST LUMPECTOMY Right 01/18/2016  . BREAST LUMPECTOMY WITH RADIOACTIVE SEED AND SENTINEL LYMPH NODE BIOPSY Right 01/18/2016   Procedure: RIGHT BREAST LUMPECTOMY WITH RADIOACTIVE SEED AND SENTINEL LYMPH NODE BIOPSY;  Surgeon: DAlphonsa Overall MD;  [place] [place]  Service: General;  Laterality: Right;  . CARDIAC CATHETERIZATION  03/23/2008   Pre-redo CABG: L main OK, LAD (T), CFX (T), OM1 99%, RCA (T), LIMA-LAD OK, SVG-OM(?3) OK w/ little florw to OM2, SVG-RCA OK. EF NL  . CARPAL TUNNEL RELEASE    . CHOLECYSTECTOMY N/A 07/07/2018   Procedure: LAPAROSCOPIC CHOLECYSTECTOMY WITH INTRAOPERATIVE CHOLANGIOGRAM ERAS PATHWAY;  Surgeon: NAlphonsa Overall MD;   Location[facility];  Service: General;  Laterality: N/A;  . COLONOSCOPY     around 2015. Possibly with [facility]   . CORONARY ARTERY BYPASS GRAFT  1998 &  re-do 2009   Had LIMA to DX/LAD, SVG to 2 marginal branches and [facility]; SVG to 3rd OM and PD at time of redo  . CYSTOSCOPY W/ URETERAL STENT PLACEMENT Right 12/20/2014   Procedure: CYSTOSCOPY WITH RETROGRADE PYELOGRAM/URETERAL STENT PLACEMENT;  Surgeon: PCleon Gustin MD;  [facility]  Service: Urology;  Laterality: Right;  . ESOPHAGOGASTRODUODENOSCOPY     many years ago per patient   . ESOPHAGOGASTRODUODENOSCOPY ENDOSCOPY  06/17/2018  . EYE SURGERY Bilateral    cataracts  . HOLMIUM LASER APPLICATION Right 75/83/0940  Procedure:  HOLMIUM LASER LITHOTRIPSY;  Surgeon: PCleon Gustin MD;  [facility]  Service: Urology;  Laterality: Right;  . IR THORACENTESIS ASP PLEURAL SPACE W/IMG GUIDE  10/03/2018  . LEFT HEART CATHETERIZATION WITH CORONARY/GRAFT ANGIOGRAM N/A 02/23/2013   Procedure: LEFT HEART CATHETERIZATION WITH CBeatrix Fetters  Surgeon: MBlane Ohara MD;  [facility];  Service: Cardiovascular;  Laterality: N/A;  . LOOP RECORDER INSERTION N/A 08/30/2017   Procedure: LOOP RECORDER INSERTION;  Surgeon: TEvans Lance MD;  [facility];  Service: Cardiovascular;  Laterality: N/A;  . PORTACATH PLACEMENT Left 01/18/2016   Procedure: INSERTION PORT-A-CATH;  Surgeon: DAlphonsa Overall MD;  [place] [facility]  Service: General;  Laterality: Left;  . portacath removal    . TONSILLECTOMY    . TRANSTHORACIC ECHOCARDIOGRAM  02-24-2013      mild LVH,  ef 50-55%/  AV bioprosthesis was present with very mild stenosis and no regurg., mean grandient 141mg, peak grandient 2072m /  mild MR/  mild LAE and RAE/  moderate TR  . TUBAL LIGATION       Medications: Current Meds  Medication Sig  . ACCU-CHEK GUIDE test strip Use to test blood sugar 3 times  daily  . acetaminophen (TYLENOL) 325 MG tablet Take 2 tablets (650 mg total) by mouth every 6 (six) hours as needed for mild pain (or Fever >/= 101).  Marland Kitchen albuterol (PROVENTIL HFA;VENTOLIN HFA) 108 (90 Base) MCG/ACT inhaler Inhale 2 puffs into the lungs every 6 (six) hours as needed for wheezing or shortness of breath.  Marland Kitchen amiodarone (PACERONE) 200 MG tablet Take 1 tablet (200 mg total) by mouth daily.  . Blood Glucose Monitoring Suppl (ACCU-CHEK AVIVA PLUS) w/Device KIT Use accu chek aviva meter to check blood sugar three times daily.  . budesonide-formoterol (SYMBICORT) 160-4.5 MCG/ACT inhaler INHALE TWO PUFFS INTO THE LUNGS TWICE DAILY  . ELIQUIS 5 MG TABS tablet TAKE ONE TABLET BY MOUTH TWICE DAILY  . epoetin alfa-epbx (RETACRIT) 31540 UNIT/ML injection Inject 40,000 Units into the skin every Friday.  Marland Kitchen exemestane (AROMASIN) 25 MG tablet TAKE ONE TABLET BY MOUTH DAILY AFTER BREAKFAST  . ezetimibe (ZETIA) 10 MG tablet TAKE ONE TABLET BY MOUTH EVERY DAY  . famotidine (PEPCID) 20 MG tablet One after supper  . furosemide (LASIX) 40 MG tablet Take (40 mg) alternating every other day with (20 mg) daily  . insulin glargine (LANTUS) 100 UNIT/ML injection Inject 16 units under the skin once daily in the morning.  . insulin lispro (HUMALOG) 100 UNIT/ML injection Inject 4-6 units under the skin three times daily before meals.  . Insulin Pen Needle 32G X 4 MM MISC Use as instructed to inject insulin 4 times daily.  Marland Kitchen loratadine (CLARITIN) 10 MG tablet Take 10 mg by mouth daily as needed for allergies.   . metoprolol tartrate (LOPRESSOR) 25 MG tablet Take 1 tablet (25 mg total) by mouth 2 (two) times daily.  . midodrine (PROAMATINE) 2.5 MG tablet Take 1 tablet (2.5 mg total) by mouth 2 (two) times daily with a meal.  . potassium chloride SA (K-DUR) 20 MEQ tablet Take 1 tablet (20 mEq total) by mouth daily.  . rosuvastatin (CRESTOR) 40 MG tablet Take 1 tablet (40 mg total) by mouth at bedtime.  Marland Kitchen zolpidem  (AMBIEN) 5 MG tablet TAKE ONE TABLET BY MOUTH AT BEDTIME AS NEEDED FOR SLEEP     Allergies: Allergies  Allergen Reactions  . Amoxicillin Rash and Other (See Comments)    Tolerates Cephalosporins Has patient had a PCN reaction causing immediate rash, facial/tongue/throat swelling, SOB or lightheadedness with hypotension: Yes Has patient had a PCN reaction causing severe rash involving mucus membranes or skin necrosis: Yes Has patient had a PCN reaction that required hospitalization No Has patient had a PCN reaction occurring within the last 10 years: No If all of the above answers are "NO", then may proceed with Cephalosporin use.   . Tape Other (See Comments)    Must use paper tape  . Aldactone [Spironolactone] Other (See Comments)    CKD/hypokalemia  . Imdur [Isosorbide Dinitrate] Other (See Comments)    Headache/severe hypotension/Syncope  . Arimidex [Anastrozole] Nausea Only  . Latex Itching and Other (See Comments)    (Dentist office)  . Tetracycline Rash    Social History: The patient  reports that she has never smoked. She has never used smokeless tobacco. She reports that she does not drink alcohol or use drugs.   Family History: The patient's family history includes Diabetes in her maternal grandmother and son; Healthy in her brother; Heart attack in her brother; Heart disease in her brother, father, and maternal grandmother; Heart failure in her father.   Review of Systems: Please see  the history of present illness.   All other systems are reviewed and negative.   Physical Exam: VS:  BP (!) 98/52   Pulse (!) 58   Ht 5' 5"  (1.651 m)   Wt 147 lb 12.8 oz (67 kg)   SpO2 99%   BMI 24.60 kg/m  .  BMI Body mass index is 24.6 kg/m.  Wt Readings from Last 3 Encounters:  03/11/19 147 lb 12.8 oz (67 kg)  03/04/19 150 lb (68 kg)  02/27/19 157 lb 8 oz (71.4 kg)    General: Pleasant. She looks quite weak. Chronically ill. Her weight is down - was 159 at my last visit.   HEENT: Normal. Egg size knot on the upper part of the back of her head - 3 staples in place.  Neck: Supple, no JVD, carotid bruits, or masses noted.  Cardiac: Regular rate and rhythm. No murmurs, rubs, or gallops. No edema.  Respiratory:  Lungs are clear to auscultation bilaterally with normal work of breathing.  GI: Soft and nontender.  MS: No deformity or atrophy. Gait and ROM intact.  Skin: Warm and dry. Color is sallow. Neuro:  Strength and sensation are intact and no gross focal deficits noted.  Psych: Alert, appropriate and with normal affect.   LABORATORY DATA:  EKG:  EKG is ordered today. This demonstrates NSR.  Lab Results  Component Value Date   WBC 6.5 03/05/2019   HGB 8.6 (L) 03/05/2019   HCT 29.5 (L) 03/05/2019   PLT 165 03/05/2019   GLUCOSE 155 (H) 03/04/2019   CHOL 106 12/02/2018   TRIG 97 12/02/2018   HDL 34 (L) 12/02/2018   LDLDIRECT 53.0 10/08/2016   LDLCALC 53 12/02/2018   ALT 27 03/04/2019   AST 49 (H) 03/04/2019   NA 136 03/04/2019   K 4.4 03/04/2019   CL 100 03/04/2019   CREATININE 2.32 (H) 03/04/2019   BUN 35 (H) 03/04/2019   CO2 26 03/04/2019   TSH 2.900 01/19/2019   INR 1.9 (H) 03/04/2019   HGBA1C 5.6 02/09/2019   MICROALBUR 53.4 (H) 05/12/2018     BNP (last 3 results) Recent Labs    03/04/19 1429  BNP 273.4*    ProBNP (last 3 results) Recent Labs    10/28/18 1041 02/19/19 1126 02/23/19 1221  PROBNP 364.0* 2,996* 2,825*     Other Studies Reviewed Today:  CXR IMPRESSION 02/20/2019: Small right effusion and right lower lobe airspace disease unchanged. Negative for edema.   Electronically Signed By: Franchot Gallo M.D. On: 02/20/2019 15:39   ABD [place] 02/17/2019  Other findings: There is a somewhat lobulated fluid collection identified along the inferior aspect of the liver. This measures 2.6 x 3.4 x 4.3 cm. This was not seen on the prior CT examination and may be related to a focal fluid collection from prior  cholecystectomy. Mild perihepatic ascites is noted.  Note is made of right-sided pleural effusion.  IMPRESSION: Status post cholecystectomy. There is a lobulated cystic area as described above along the inferior right hepatic margin. This could be related to the prior surgery possibly representing a small seroma. CT may be helpful for further evaluation.  Small right pleural effusion.  Mild ascites.  Increased echogenicity within the liver likely related to fatty infiltration or underlying hepatocellular disease.  Electronically Signed By: Inez Catalina M.D. On: 02/17/2019 10:18  Breast Ultrasound 01/2019 IMPRESSION: 1. New irregular hypoechoic mass along the posterior margin of the scar right breast 8 o'clock position. Findings are nonspecific however concerning  for the possibility of localized recurrence. 2. Within the outer aspect of the right axilla there are two adjacent irregular hypoechoic masses just deep to the skin which are nonspecific. These may represent focal fat necrosis or potentially recurrent or metastatic disease.  RECOMMENDATION: 1. Ultrasound-guided core needle biopsy right breast mass 8 o'clock position just deep to the scar. 2. Ultrasound-guided core needle biopsy of both irregular hypoechoic masses within the outer aspect of the right axilla.     ECHOIMPRESSIONS5/2020  1. The left ventricle has normal systolic function with an ejection fraction of 60-65%. The cavity size was normal. There is mildly increased left ventricular wall thickness. Left ventricular diastolic Doppler parameters are consistent with  pseudonormalization. 2. The right ventricle has normal systolic function. The cavity was mildly enlarged. 3. Left atrial size was moderately dilated. 4. Right atrial size was mildly dilated. 5. The mitral valve is grossly normal. Mild thickening of the mitral valve leaflet. There is mild mitral annular calcification  present. 6. The tricuspid valve is grossly normal. 7. There is mild dilatation of the ascending aorta measuring 40 mm. 8. Normal LV systolic function; moderate diastolic dysfunction; mild LVH; mildly dilated ascending aorta; s/p AVR with mean gradient of 18 mmHg and no AI; biatrial enlargement; mild MR; mild RVE.   EchoStudy Conclusions8/2019  - Left ventricle: The cavity size was normal. Systolic function was normal. The estimated ejection fraction was in the range of 60% to 65%.Wall motion was normal; there were no regional wall motion abnormalities. Features are consistent with a pseudonormal left ventricular filling pattern, with concomitant abnormal relaxation and increased filling pressure (grade 2 diastolic dysfunction). Doppler parameters are consistent with high ventricular filling pressure. - Aortic valve: A bioprosthesis was present and functioning normally. Mean gradient (S): 16 mm Hg. - Mitral valve: Calcified annulus. There was mild regurgitation. - Left atrium: The atrium was moderately dilated. - Right ventricle: The cavity size was mildly dilated. Wall thickness was normal. - Tricuspid valve: There was mild regurgitation. - Pulmonary arteries: Systolic pressure could not be accurately estimated.   MYOVIEW FINDINGS FROM 06/2013: Normal resting EKG. Slight ST depressions noted in aVL after administration of lexiscan. Mild shortness of breath and nausea resolved after the test. EKG is nondiagnostic for ischemia. TID ratio 1.05. Lung-heart ratio 0.43. Normal ventricular chamber size.  IMPRESSION: No evidence for ischemia. Normal wall motion. LVEF 72%.  Electronically Signed By: Guy Sandifer   Cardiac Cath: 02/23/2013 Left mainstem: Normal  Left anterior descending (LAD): Severe proximal and mid calcification with long proximal 95% stenosis. The mid and distal vessel is small but free of high grade disease.  Left circumflex  (LCx):AV groove has a mid 90% stenosis before a moderate sized MOM. OM1 and OM2 are occluded at the ostium and fill via the SVG. OM3 occluded at the ostium and fills via SVG. The grafted OMs are small and diffusely disease.  Right coronary artery (RCA): Occluded in the mid vessel. The PDA is moderate sized and occluded at the ostium. There is a 70% stenosis in the PDA after the insertion of the vein graft.  Grafts:  LIMA to LAD: Patent  SVG to RCA: Occluded (from original CABG)  SVG to RCA: Patent (from redo CABG). This is mild diffuse plaque within the vein graft.  SVG to OM3: Patent. This is mild diffuse plaque within the vein graft.  SVG sequential to OM1/OM2: Patent. There is ostial 50% stenosis. There is a patent proximal SVG stent. The native marginals are very  small.  Left ventriculography: LV not injected. AVR not crossed.  Final Conclusions: Severe native vessel CAD. Patent grafts as described with nonobstructive disease in the grafts. A stent placed into the sequential SVG is patent with some disease at the ostium that on several views in not occlusive. I did compare the 2009 cath with results today. There is high grade disease in the circumflex AV groove. However, this is not changed from 2009. However, this lesion does appear to lead into a non grafted OM. If she has further symptoms I would consider PCI of the native circumflex.     Assessment/Plan:  1. Fall/dizziness ? Orthostatic - with subsequent laceration to the back of her head - slow recovery.   2. Chronic diastolic HF - weight is down again - cutting Lasix back again.  3. Recurrent pleural effusion - better by most recent CXR - still need to consider CT - may need to consider Pleurex - she has more orthostasis with more diuresis - now with recurrent injury - she has had prior shoulder fracture in the past as well .  4.  Abnormal Abd [place] - may need CT - her plans to see GI are on hold - LFT mildly abnormal - recheck on  return visit.   5. Profound anemia - may still be needing BMS - plans for colonoscopy have been placed on hold due to the fall and laceration - seeing Dr. Lindi Adie in about 2 weeks. Goes tomorrow for another iron infusion.   6.  CAD/aortic valve disease - remote CABG/AVR - managed medically - no active symptoms and would still favor ongoing medical therapy given her degree of CKD  7. CKD - stage IV - she has had recent visit with Dr. Posey Pronto.   8. History of orthostasis - reports negative adrenal work up - I see where she had an [place] from March 2019 - not sure if we need to re-consider this going forward. Still may need to consider. Now on Midodrine TID. Cutting Lasix back again today.   9. PAF - in sinus by EKG - on amiodarone .  10. Carotid disease-noted 1 to 39% bilateral stenosis from study 10/2017 -will need repeat study 10/2019- not discussed today  11. Prior breast cancer - recent abnormal mammogram - s/p biopsies.   12. Recent fall/dizziness with injury - see above.   25. COVID-19 Education: The signs and symptoms of COVID-19 were discussed with the patient and how to seek care for testing (follow up with PCP or arrange E-visit).  The importance of social distancing, staying at home, hand hygiene and wearing a mask when out in public were discussed today.  Current medicines are reviewed with the patient today.  The patient does not have concerns regarding medicines other than what has been noted above.  The following changes have been made:  See above.  Labs/ tests ordered today include:    Orders Placed This Encounter  Procedures  . EKG 12-Lead     Disposition:   FU with me next week.   Patient is agreeable to this plan and will call if any problems develop in the interim.   SignedTruitt Merle, NP  03/11/2019 3:13 PM  [facility] [address] [place], [place]  [address] Phone: [phone] Fax: [phone]

## 2019-03-11 ENCOUNTER — Other Ambulatory Visit: Payer: Self-pay | Admitting: *Deleted

## 2019-03-11 ENCOUNTER — Other Ambulatory Visit: Payer: Self-pay

## 2019-03-11 ENCOUNTER — Ambulatory Visit (INDEPENDENT_AMBULATORY_CARE_PROVIDER_SITE_OTHER): Payer: Medicare Other | Admitting: Nurse Practitioner

## 2019-03-11 ENCOUNTER — Encounter: Payer: Self-pay | Admitting: Nurse Practitioner

## 2019-03-11 VITALS — BP 98/52 | HR 58 | Ht 65.0 in | Wt 147.8 lb

## 2019-03-11 DIAGNOSIS — N184 Chronic kidney disease, stage 4 (severe): Secondary | ICD-10-CM | POA: Diagnosis not present

## 2019-03-11 DIAGNOSIS — I2510 Atherosclerotic heart disease of native coronary artery without angina pectoris: Secondary | ICD-10-CM

## 2019-03-11 DIAGNOSIS — I951 Orthostatic hypotension: Secondary | ICD-10-CM

## 2019-03-11 DIAGNOSIS — I428 Other cardiomyopathies: Secondary | ICD-10-CM | POA: Diagnosis not present

## 2019-03-11 MED ORDER — APIXABAN 5 MG PO TABS: 5.0000 mg | ORAL_TABLET | Freq: Two times a day (BID) | ORAL | 2 refills | Status: DC

## 2019-03-11 MED ORDER — FUROSEMIDE 20 MG PO TABS: ORAL_TABLET | ORAL | 2 refills | Status: DC

## 2019-03-11 MED ORDER — ROSUVASTATIN CALCIUM 40 MG PO TABS: 40.0000 mg | ORAL_TABLET | Freq: Every day | ORAL | 3 refills | Status: DC

## 2019-03-11 NOTE — Patient Instructions (Signed)
After Visit Summary:  We will be checking the following labs today - NONE  I will do lab next week   Medication Instructions:    Continue with your current medicines. BUT  Cut your Lasix back to just 20 mg every other day  I will send in your refills in   If you need a refill on your cardiac medications before your next appointment, please call your pharmacy.     Testing/Procedures To Be Arranged:  N/A  Follow-Up:   See me next week = we will plan to remove your staples.     At [facility], you and your health needs are our priority.  As part of our continuing mission to provide you with exceptional heart care, we have created designated Provider Care Teams.  These Care Teams include your primary Cardiologist (physician) and Advanced Practice Providers (APPs -  Physician Assistants and Nurse Practitioners) who all work together to provide you with the care you need, when you need it.  Special Instructions:  . Stay safe, stay home, wash your hands for at least 20 seconds and wear a mask when out in public.  . It was good to talk with you today.    Call the [place] office at [phone] if you have any questions, problems or concerns.

## 2019-03-12 ENCOUNTER — Telehealth: Payer: Self-pay

## 2019-03-12 ENCOUNTER — Inpatient Hospital Stay: Payer: Medicare Other

## 2019-03-12 ENCOUNTER — Inpatient Hospital Stay: Payer: Medicare Other | Attending: Hematology and Oncology

## 2019-03-12 ENCOUNTER — Telehealth: Payer: Self-pay | Admitting: Gastroenterology

## 2019-03-12 ENCOUNTER — Other Ambulatory Visit: Payer: Self-pay

## 2019-03-12 VITALS — BP 122/72 | HR 62 | Temp 98.7°F | Resp 18

## 2019-03-12 DIAGNOSIS — Z79811 Long term (current) use of aromatase inhibitors: Secondary | ICD-10-CM | POA: Diagnosis not present

## 2019-03-12 DIAGNOSIS — Z170 Estrogen receptor positive status [ER+]: Secondary | ICD-10-CM

## 2019-03-12 DIAGNOSIS — N183 Chronic kidney disease, stage 3 unspecified: Secondary | ICD-10-CM | POA: Diagnosis not present

## 2019-03-12 DIAGNOSIS — Z794 Long term (current) use of insulin: Secondary | ICD-10-CM | POA: Diagnosis not present

## 2019-03-12 DIAGNOSIS — D638 Anemia in other chronic diseases classified elsewhere: Secondary | ICD-10-CM

## 2019-03-12 DIAGNOSIS — C50211 Malignant neoplasm of upper-inner quadrant of right female breast: Secondary | ICD-10-CM | POA: Insufficient documentation

## 2019-03-12 DIAGNOSIS — I129 Hypertensive chronic kidney disease with stage 1 through stage 4 chronic kidney disease, or unspecified chronic kidney disease: Secondary | ICD-10-CM | POA: Diagnosis present

## 2019-03-12 DIAGNOSIS — Z79899 Other long term (current) drug therapy: Secondary | ICD-10-CM | POA: Diagnosis not present

## 2019-03-12 DIAGNOSIS — D631 Anemia in chronic kidney disease: Secondary | ICD-10-CM | POA: Diagnosis not present

## 2019-03-12 LAB — CBC WITH DIFFERENTIAL (CANCER CENTER ONLY)
Abs Immature Granulocytes: 0.01 10*3/uL (ref 0.00–0.07)
Basophils Absolute: 0 10*3/uL (ref 0.0–0.1)
Basophils Relative: 1 %
Eosinophils Absolute: 0.2 10*3/uL (ref 0.0–0.5)
Eosinophils Relative: 4 %
HCT: 28.4 % — ABNORMAL LOW (ref 36.0–46.0)
Hemoglobin: 8.4 g/dL — ABNORMAL LOW (ref 12.0–15.0)
Immature Granulocytes: 0 %
Lymphocytes Relative: 12 %
Lymphs Abs: 0.6 10*3/uL — ABNORMAL LOW (ref 0.7–4.0)
MCH: 28.2 pg (ref 26.0–34.0)
MCHC: 29.6 g/dL — ABNORMAL LOW (ref 30.0–36.0)
MCV: 95.3 fL (ref 80.0–100.0)
Monocytes Absolute: 0.6 10*3/uL (ref 0.1–1.0)
Monocytes Relative: 11 %
Neutro Abs: 3.8 10*3/uL (ref 1.7–7.7)
Neutrophils Relative %: 72 %
Platelet Count: 157 10*3/uL (ref 150–400)
RBC: 2.98 MIL/uL — ABNORMAL LOW (ref 3.87–5.11)
RDW: 14.6 % (ref 11.5–15.5)
WBC Count: 5.2 10*3/uL (ref 4.0–10.5)
nRBC: 0 % (ref 0.0–0.2)

## 2019-03-12 LAB — SAMPLE TO BLOOD BANK

## 2019-03-12 MED ORDER — EPOETIN ALFA-EPBX 40000 UNIT/ML IJ SOLN
40000.0000 [IU] | Freq: Once | INTRAMUSCULAR | Status: AC
  Administered 2019-03-12: 40000 [IU] via SUBCUTANEOUS
  Filled 2019-03-12: qty 1

## 2019-03-12 NOTE — Telephone Encounter (Signed)
Patient reports she felt like she was going to have a syncopal episode at approximately 7 pm on 03/11/19 . She used her symptom activator and LINQ transmission from 1851 was reviewed and showed no pauses , no tachy events, and no brady events. Recommended that patient contact PCP concerning episode to discuss possible orthostatic issues related to episodes.

## 2019-03-12 NOTE — Telephone Encounter (Signed)
Pt states she felt like she was going to pass out so she pushed her symptom activator. She states the episode happened around 7 pm last night. I told her I will have the nurse take a look at it.

## 2019-03-13 ENCOUNTER — Other Ambulatory Visit: Payer: Self-pay

## 2019-03-13 ENCOUNTER — Other Ambulatory Visit: Payer: Medicare Other

## 2019-03-13 ENCOUNTER — Ambulatory Visit: Payer: Medicare Other

## 2019-03-13 DIAGNOSIS — R1013 Epigastric pain: Secondary | ICD-10-CM

## 2019-03-13 DIAGNOSIS — K2970 Gastritis, unspecified, without bleeding: Secondary | ICD-10-CM

## 2019-03-13 DIAGNOSIS — R110 Nausea: Secondary | ICD-10-CM

## 2019-03-13 NOTE — Telephone Encounter (Signed)
Please see additional documentation concerning this patient 

## 2019-03-13 NOTE — Progress Notes (Signed)
CARDIOLOGY OFFICE NOTE  Date:  03/18/2019    Hannah [name] Date of Birth: Jul 01, 1945 Medical Record #[id number]  PCP:  Mackie Pai, PA-C  Cardiologist:  Atilano Median   Chief Complaint  Patient presents with  . Follow-up    History of Present Illness: Hannah [name] is a 73 y.o. female who presents today for a follow up visit. Seen for Dr. Lovena Le.   She has a very complex history - this includes ahistory ofCAD s/p CABG (1998 with redo 2009), bioprosthetic AVR in 8786, chronic diastolic CHF, asthma, carotid artery disease (1-39% by duplex 10/2017, due 2021), CKD stage IV, PAF- has been on Multaq in the past and now on amiodarone, atrial tachycardia, breast CA, orthostasis,chronicanemia, thrombocytopenia, mild dilation of ascending aorta by echo 10/2018, recurrent syncope/orthostasis,recurrentpleural effusion requiring thoracentesis4/2020, HTN, GERD,&HLD.   I have followed her for many years - she has had periodic issues with volume overload, orthostasis and tachy palpitations.Last cath in 2014 - grafts patent. Myoview in 2015 without ischemia.  Over the past few months she has had more issues with her pleural effusion - diuretics caused worsening CKD/orthostasis. She has gotten more anemic - back to hematology and now on therapy. Concern for MDS and will be having bone marrow biopsy - also needing colonoscopy due to profound anemia (HGB down to 6 which is highly unusual for Hannah [name]).Most recent abdominal ultrasound noted and concerning for liver abnormality. Recent mammogram also recently noted and abnormal - ended up having repeat biopsy.  I saw her around 7 weeks ago - we had restartedlow dosediuretics for recurringpleuraleffusion - she was better at my visit but I was still quite concerned that there is "something else going on" that has just not been found yet. She has had progressive failure to thrive. 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 We kept our follow up for here - she had been home a few days - was doing better - finishing antibiotics but surprised at the diagnosis of pneumonia.    She was getting ready to have colonoscopy. She was seeing Heme/Onc. She was not on Midodrine and had not been orthostatic. She had had an abnormal mammogram - had 4 biopsies which turned out ok. She was not aware of her abdominal US results - possible need for CT scan.   I was going to see her the following week with a CXR (03/04/2019). She completed the CXR- this showed improvement in the effusion/pneumonia. She was on her way here - got out of her car and was walking in to the building - got dizzy - and fell - significant laceration to the back of her head. Ended up calling EMS and she was taken to the ER - CT of the head was ok.    Her ILR monitor was noted to be up to date through that morning of the event.  No symptom, tachy, pause, brady, or AF episodes detected in recent months.   We were then in touch by phone - BP had been low - we added the Midodrine and cut the Lasix back. I then saw her last week - pretty weak - very fatigued. BP still low - we cut her Lasix back more. We agreed she should not be left alone, no driving for now, etc.   She had seen Dr. Posey Pronto with  nephrology - he thought things were stable. Her colonoscopy was cancelled - abdominal CT was to be done. See below.   The patient does not have symptoms concerning for COVID-19 infection (fever, chills, cough, or new shortness of breath).   Comes in today. Here with [name] her husband. She looks a little better. Getting a little stronger. Wanting to let her family "not hover" as much. No chest pain. Breathing is ok. Weight is down 2 more pounds. She is not short of breath. On just 20 mg of Lasix QOD. No headache. Feels more awake. Some  decreased ROM of the left arm - this is slowly improving - this is the same shoulder she fractured in the past when she passed out.   Past Medical History:  Diagnosis Date  . Abnormally small mouth   . Allergic rhinitis 10/14/2009   Qualifier: Diagnosis of  By: Lamonte Sakai MD, Rose Fillers   Overview:  Overview:  Qualifier: Diagnosis of  By: Lamonte Sakai MD, Rose Fillers  Last Assessment & Plan:  Please continue Xyzal and Nasacort as you have been using them  . Anemia   . Asthma 05/12/2009   10/12/2014 p extensive coaching HFA effectiveness =    75% s spacer    Overview:  Overview:  10/12/2014 p extensive coaching HFA effectiveness =    75% s spacer   Last Assessment & Plan:  Please continue Symbicort 2 puffs twice a day. Remember to rinse and gargle after taking this medication. Take albuterol 2 puffs up to every 4 hours if needed for shortness of breath.  Follow with Dr Lamonte Sakai in 6 month  . Bilateral carotid artery stenosis    a. mild 1-39% by duplex 10/2017, due 2021.  . Breast cancer ([place]) 01/18/2016   right breast  . CAD (coronary artery disease) 01/24/2011   a. s/p CABG 1998 with redo 2009.  Marland Kitchen Chemotherapy induced nausea and vomiting 02/24/2016  . Chemotherapy-induced peripheral neuropathy ([place]) 04/27/2016  . 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 Chronic diastolic CHF (congestive heart failure) ([place]) 09/24/2013  . CKD (chronic kidney disease), stage IV ([place]) 09/24/2013  . Closed fracture of head of left humerus 09/09/2017  . Complication of anesthesia   . Essential hypertension 05/12/2009   Qualifier: Diagnosis of  By: Lamonte Sakai MD, Rose Fillers   . Gallstones   . GERD (gastroesophageal reflux disease)   . Glaucoma   . H/O atrial tachycardia 05/12/2009   Qualifier: History of  By: Lamonte Sakai MD, Rose Fillers   Overview:  Overview:  Qualifier: History of  By: Lamonte Sakai MD, Rose Fillers  Last Assessment & Plan:  There was no evidence of this on her cardiac monitor. I would recommend watchful waiting.   Marland Kitchen History of non-ST  elevation myocardial infarction (NSTEMI)    Sept 2014--  thought to be type II HTN w/ LHC without infarct related artery and patent grafts  . History of radiation therapy 05/24/16-07/26/16   right breast 50.4 Gy in 28 fractions, right breast boost 10 Gy in 5 fractions  . Hyperlipidemia   . Hypertension    Resolved.  . Iron deficiency anemia   . Mania ([place]) 03/05/2016  . Moderate persistent asthma    pulmologist-  Dr. Malvin Johns  . Osteomyelitis of toe of left foot ([place]) 04/06/2016  . Osteopenia of multiple sites 10/19/2015  . PAF (paroxysmal atrial fibrillation) ([place])   . Personal history of chemotherapy 2017  . Personal history of radiation therapy 2017  . PONV (postoperative nausea and vomiting)   .  Port catheter in place 02/17/2016  . Psoriasis    right leg  . Renal calculus, right   . S/P AVR    prosthesis valve placement 2009 at same time re-do CABG  . Seizure-like activity ([place]) 09/01/2017  . Sensorineural hearing loss (SNHL) of both ears 01/06/2016  . Stroke [facility])    residual rt hearing loss  . Syncope 09/06/2017  . Type 2 diabetes mellitus ([place])    monitored by dr Dwyane Dee    Past Surgical History:  Procedure Laterality Date  . AORTIC VALVE REPLACEMENT  2009   [facility]done same time is [facility]G)  . BREAST LUMPECTOMY Right 01/18/2016  . BREAST LUMPECTOMY WITH RADIOACTIVE SEED AND SENTINEL LYMPH NODE BIOPSY Right 01/18/2016   Procedure: RIGHT BREAST LUMPECTOMY WITH RADIOACTIVE SEED AND SENTINEL LYMPH NODE BIOPSY;  Surgeon: DAlphonsa Overall MD;  [place] [facility] [place]  Service: General;  Laterality: Right;  . CARDIAC CATHETERIZATION  03/23/2008   Pre-redo CABG: L main OK, LAD (T), CFX (T), OM1 99%, RCA (T), LIMA-LAD OK, SVG-OM(?3) OK w/ little florw to OM2, SVG-RCA OK. EF NL  . CARPAL TUNNEL RELEASE    . CHOLECYSTECTOMY N/A 07/07/2018   Procedure: LAPAROSCOPIC CHOLECYSTECTOMY WITH INTRAOPERATIVE CHOLANGIOGRAM ERAS PATHWAY;  Surgeon: NAlphonsa Overall MD;  [facility]  ORS;  Service: General;  Laterality: N/A;  . COLONOSCOPY     around 2015. Possibly with [place]   . CORONARY ARTERY BYPASS GRAFT  1998 &  re-do 2009   Had LIMA to DX/LAD, SVG to 2 marginal branches and [facility]; SVG to 3rd OM and PD at time of redo  . CYSTOSCOPY W/ URETERAL STENT PLACEMENT Right 12/20/2014   Procedure: CYSTOSCOPY WITH RETROGRADE PYELOGRAM/URETERAL STENT PLACEMENT;  Surgeon: PCleon Gustin MD;  [facility]  Service: Urology;  Laterality: Right;  . ESOPHAGOGASTRODUODENOSCOPY     many years ago per patient   . ESOPHAGOGASTRODUODENOSCOPY ENDOSCOPY  06/17/2018  . EYE SURGERY Bilateral    cataracts  . HOLMIUM LASER APPLICATION Right [id number]/6734  Procedure:  HOLMIUM LASER LITHOTRIPSY;  Surgeon: PCleon Gustin MD;  [facility]  Service: Urology;  Laterality: Right;  . IR THORACENTESIS ASP PLEURAL SPACE W/IMG GUIDE  10/03/2018  . LEFT HEART CATHETERIZATION WITH CORONARY/GRAFT ANGIOGRAM N/A 02/23/2013   Procedure: LEFT HEART CATHETERIZATION WITH CBeatrix Fetters  Surgeon: MBlane Ohara MD;  [facility];  Service: Cardiovascular;  Laterality: N/A;  . LOOP RECORDER INSERTION N/A 08/30/2017   Procedure: LOOP RECORDER INSERTION;  Surgeon: TEvans Lance MD;  [facility];  Service: Cardiovascular;  Laterality: N/A;  . PORTACATH PLACEMENT Left 01/18/2016   Procedure: INSERTION PORT-A-CATH;  Surgeon: DAlphonsa Overall MD;  [place]  Service: General;  Laterality: Left;  . portacath removal    . TONSILLECTOMY    . TRANSTHORACIC ECHOCARDIOGRAM  02-24-2013      mild LVH,  ef 50-55%/  AV bioprosthesis was present with very mild stenosis and no regurg., mean grandient 150mg, peak grandient 2020m /  mild MR/  mild LAE and RAE/  moderate TR  . TUBAL LIGATION       Medications: Current Meds  Medication Sig  . ACCU-CHEK GUIDE test strip Use to test blood sugar 3 times daily  .  acetaminophen (TYLENOL) 325 MG tablet Take 2 tablets (650 mg total) by mouth every 6 (six) hours as needed for mild pain (or Fever >/= 101).  .Marland Kitchen  albuterol (PROVENTIL HFA;VENTOLIN HFA) 108 (90 Base) MCG/ACT inhaler Inhale 2 puffs into the lungs every 6 (six) hours as needed for wheezing or shortness of breath.  Marland Kitchen amiodarone (PACERONE) 200 MG tablet Take 1 tablet (200 mg total) by mouth daily.  Marland Kitchen apixaban (ELIQUIS) 5 MG TABS tablet Take 1 tablet (5 mg total) by mouth 2 (two) times daily.  . Blood Glucose Monitoring Suppl (ACCU-CHEK AVIVA PLUS) w/Device KIT Use accu chek aviva meter to check blood sugar three times daily.  . budesonide-formoterol (SYMBICORT) 160-4.5 MCG/ACT inhaler INHALE TWO PUFFS INTO THE LUNGS TWICE DAILY  . epoetin alfa-epbx (RETACRIT) 57846 UNIT/ML injection Inject 40,000 Units into the skin every Friday.  Marland Kitchen exemestane (AROMASIN) 25 MG tablet TAKE ONE TABLET BY MOUTH DAILY AFTER BREAKFAST  . ezetimibe (ZETIA) 10 MG tablet TAKE ONE TABLET BY MOUTH EVERY DAY  . famotidine (PEPCID) 20 MG tablet One after supper  . furosemide (LASIX) 20 MG tablet Take 20 mg every other day.  . insulin glargine (LANTUS) 100 UNIT/ML injection Inject 16 units under the skin once daily in the morning.  . insulin lispro (HUMALOG) 100 UNIT/ML injection Inject 4-6 units under the skin three times daily before meals.  . Insulin Pen Needle 32G X 4 MM MISC Use as instructed to inject insulin 4 times daily.  Marland Kitchen loratadine (CLARITIN) 10 MG tablet Take 10 mg by mouth daily as needed for allergies.   . metoprolol tartrate (LOPRESSOR) 25 MG tablet Take 1 tablet (25 mg total) by mouth 2 (two) times daily.  . midodrine (PROAMATINE) 2.5 MG tablet Take 1 tablet (2.5 mg total) by mouth 2 (two) times daily with a meal.  . nitroGLYCERIN (NITROSTAT) 0.4 MG SL tablet Place 1 tablet (0.4 mg total) under the tongue every 5 (five) minutes as needed for chest pain.  . potassium chloride SA (K-DUR) 20 MEQ tablet Take 1 tablet (20  mEq total) by mouth daily.  . rosuvastatin (CRESTOR) 40 MG tablet Take 1 tablet (40 mg total) by mouth at bedtime.  Marland Kitchen zolpidem (AMBIEN) 5 MG tablet TAKE ONE TABLET BY MOUTH AT BEDTIME AS NEEDED FOR SLEEP     Allergies: Allergies  Allergen Reactions  . Amoxicillin Rash and Other (See Comments)    Tolerates Cephalosporins Has patient had a PCN reaction causing immediate rash, facial/tongue/throat swelling, SOB or lightheadedness with hypotension: Yes Has patient had a PCN reaction causing severe rash involving mucus membranes or skin necrosis: Yes Has patient had a PCN reaction that required hospitalization No Has patient had a PCN reaction occurring within the last 10 years: No If all of the above answers are "NO", then may proceed with Cephalosporin use.   . Tape Other (See Comments)    Must use paper tape  . Aldactone [Spironolactone] Other (See Comments)    CKD/hypokalemia  . Imdur [Isosorbide Dinitrate] Other (See Comments)    Headache/severe hypotension/Syncope  . Arimidex [Anastrozole] Nausea Only  . Latex Itching and Other (See Comments)    (Dentist office)  . Tetracycline Rash    Social History: The patient  reports that she has never smoked. She has never used smokeless tobacco. She reports that she does not drink alcohol or use drugs.   Family History: The patient's family history includes Diabetes in her maternal grandmother and son; Healthy in her brother; Heart attack in her brother; Heart disease in her brother, father, and maternal grandmother; Heart failure in her father.   Review of Systems: Please see the history of present  illness.   All other systems are reviewed and negative.   Physical Exam: VS:  BP (!) 130/48   Pulse (!) 55   Ht 5' 5"  (1.651 m)   Wt 145 lb (65.8 kg)   SpO2 99%   BMI 24.13 kg/m  .  BMI Body mass index is 24.13 kg/m.  Wt Readings from Last 3 Encounters:  03/18/19 145 lb (65.8 kg)  03/11/19 147 lb 12.8 oz (67 kg)  03/04/19 150 lb  (68 kg)    General: Pleasant. Chronically ill. Weight is down 2 pounds. She looks better today to me today.  HEENT: Normal.  Neck: Supple, no JVD, carotid bruits, or masses noted.  Cardiac: Regular rate and rhythm. Outflow murmur. No edema.  Respiratory:  Lungs are clear to auscultation bilaterally with normal work of breathing.  GI: Soft and nontender.  MS: No deformity or atrophy. Gait and ROM intact.  Skin: Warm and dry. Color is normal.  Neuro:  Strength and sensation are intact and no gross focal deficits noted.  Psych: Alert, appropriate and with normal affect.   LABORATORY DATA:  EKG:  EKG is not ordered today.  Lab Results  Component Value Date   WBC 5.2 03/12/2019   HGB 8.4 (L) 03/12/2019   HCT 28.4 (L) 03/12/2019   PLT 157 03/12/2019   GLUCOSE 155 (H) 03/04/2019   CHOL 106 12/02/2018   TRIG 97 12/02/2018   HDL 34 (L) 12/02/2018   LDLDIRECT 53.0 10/08/2016   LDLCALC 53 12/02/2018   ALT 27 03/04/2019   AST 49 (H) 03/04/2019   NA 136 03/04/2019   K 4.4 03/04/2019   CL 100 03/04/2019   CREATININE 2.32 (H) 03/04/2019   BUN 35 (H) 03/04/2019   CO2 26 03/04/2019   TSH 2.900 01/19/2019   INR 1.9 (H) 03/04/2019   HGBA1C 5.6 02/09/2019   MICROALBUR 53.4 (H) 05/12/2018     BNP (last 3 results) Recent Labs    03/04/19 1429  BNP 273.4*    ProBNP (last 3 results) Recent Labs    10/28/18 1041 02/19/19 1126 02/23/19 1221  PROBNP 364.0* 2,996* 2,825*     Other Studies Reviewed Today:  CT ABDOMEN IMPRESSION 03/2019: Chest Impression:  1. Mild RIGHT basilar atelectasis and small RIGHT effusion. 2. Increased skin thickening in the lateral RIGHT breast. Recommend clinical correlation. 3. Stable seroma in the RIGHT breast.  Abdomen / Pelvis Impression:  1. No acute findings in the abdomen pelvis. 2. Nonobstructing calculi in the LEFT renal pelvis. 3. Degenerative endplate change in the lumbar spine similar to comparison CT 2019. 4. Coronary artery  calcification and Aortic Atherosclerosis (ICD10-I70.0).   Electronically Signed   By: Suzy Bouchard M.D.   On: 03/17/2019 09:59   CXRIMPRESSION9/04/2019: Small right effusion and right lower lobe airspace disease unchanged. Negative for edema.   Electronically Signed By: Franchot Gallo M.D. On: 02/20/2019 15:39   ABD [place] 02/17/2019 Other findings: There is a somewhat lobulated fluid collection identified along the inferior aspect of the liver. This measures 2.6 x 3.4 x 4.3 cm. This was not seen on the prior CT examination and may be related to a focal fluid collection from prior cholecystectomy. Mild perihepatic ascites is noted.  Note is made of right-sided pleural effusion.  IMPRESSION: Status post cholecystectomy. There is a lobulated cystic area as described above along the inferior right hepatic margin. This could be related to the prior surgery possibly representing a small seroma. CT may be helpful for further evaluation.  Small right pleural effusion.  Mild ascites.  Increased echogenicity within the liver likely related to fatty infiltration or underlying hepatocellular disease.  Electronically Signed By: Inez Catalina M.D. On: 02/17/2019 10:18  Breast Ultrasound 01/2019 IMPRESSION: 1. New irregular hypoechoic mass along the posterior margin of the scar right breast 8 o'clock position. Findings are nonspecific however concerning for the possibility of localized recurrence. 2. Within the outer aspect of the right axilla there are two adjacent irregular hypoechoic masses just deep to the skin which are nonspecific. These may represent focal fat necrosis or potentially recurrent or metastatic disease.  RECOMMENDATION: 1. Ultrasound-guided core needle biopsy right breast mass 8 o'clock position just deep to the scar. 2. Ultrasound-guided core needle biopsy of both irregular hypoechoic masses within the outer aspect of the right  axilla.     ECHOIMPRESSIONS5/2020  1. The left ventricle has normal systolic function with an ejection fraction of 60-65%. The cavity size was normal. There is mildly increased left ventricular wall thickness. Left ventricular diastolic Doppler parameters are consistent with  pseudonormalization. 2. The right ventricle has normal systolic function. The cavity was mildly enlarged. 3. Left atrial size was moderately dilated. 4. Right atrial size was mildly dilated. 5. The mitral valve is grossly normal. Mild thickening of the mitral valve leaflet. There is mild mitral annular calcification present. 6. The tricuspid valve is grossly normal. 7. There is mild dilatation of the ascending aorta measuring 40 mm. 8. Normal LV systolic function; moderate diastolic dysfunction; mild LVH; mildly dilated ascending aorta; s/p AVR with mean gradient of 18 mmHg and no AI; biatrial enlargement; mild MR; mild RVE.   EchoStudy Conclusions8/2019  - Left ventricle: The cavity size was normal. Systolic function was normal. The estimated ejection fraction was in the range of 60% to 65%.Wall motion was normal; there were no regional wall motion abnormalities. Features are consistent with a pseudonormal left ventricular filling pattern, with concomitant abnormal relaxation and increased filling pressure (grade 2 diastolic dysfunction). Doppler parameters are consistent with high ventricular filling pressure. - Aortic valve: A bioprosthesis was present and functioning normally. Mean gradient (S): 16 mm Hg. - Mitral valve: Calcified annulus. There was mild regurgitation. - Left atrium: The atrium was moderately dilated. - Right ventricle: The cavity size was mildly dilated. Wall thickness was normal. - Tricuspid valve: There was mild regurgitation. - Pulmonary arteries: Systolic pressure could not be accurately estimated.   MYOVIEW FINDINGS FROM 06/2013: Normal  resting EKG. Slight ST depressions noted in aVL after administration of lexiscan. Mild shortness of breath and nausea resolved after the test. EKG is nondiagnostic for ischemia. TID ratio 1.05. Lung-heart ratio 0.43. Normal ventricular chamber size.  IMPRESSION: No evidence for ischemia. Normal wall motion. LVEF 72%.  Electronically Signed By: Guy Sandifer   Cardiac Cath: 02/23/2013 Left mainstem: Normal  Left anterior descending (LAD): Severe proximal and mid calcification with long proximal 95% stenosis. The mid and distal vessel is small but free of high grade disease.  Left circumflex (LCx):AV groove has a mid 90% stenosis before a moderate sized MOM. OM1 and OM2 are occluded at the ostium and fill via the SVG. OM3 occluded at the ostium and fills via SVG. The grafted OMs are small and diffusely disease.  Right coronary artery (RCA): Occluded in the mid vessel. The PDA is moderate sized and occluded at the ostium. There is a 70% stenosis in the PDA after the insertion of the vein graft.  Grafts:  LIMA to LAD: Patent  SVG to  RCA: Occluded (from original CABG)  SVG to RCA: Patent (from redo CABG). This is mild diffuse plaque within the vein graft.  SVG to OM3: Patent. This is mild diffuse plaque within the vein graft.  SVG sequential to OM1/OM2: Patent. There is ostial 50% stenosis. There is a patent proximal SVG stent. The native marginals are very small.  Left ventriculography: LV not injected. AVR not crossed.  Final Conclusions: Severe native vessel CAD. Patent grafts as described with nonobstructive disease in the grafts. A stent placed into the sequential SVG is patent with some disease at the ostium that on several views in not occlusive. I did compare the 2009 cath with results today. There is high grade disease in the circumflex AV groove. However, this is not changed from 2009. However, this lesion does appear to lead into a non grafted OM. If she has further symptoms I  would consider PCI of the native circumflex.     Assessment/Plan:  1. Fall/dizziness ? Orthostatic - with subsequent laceration to the back of her head - slow recovery. She is improving. Staples x 3 removed today without incident. Some shoulder immobility that is persistent - she is watching - may need to see ortho [place]) at some point.   2. Chronic diastolic HF - weight is down - on low dose Lasix - will try to keep her weight below 150#.   3. Recurrent pleural effusion - better by most recent CXR - noted on the CT scan yesterday  - still may need to consider CT - may need to consider Pleurex - she has a history of having more orthostasis with more diuresis - now with recurrent injury/laceration - she has had prior shoulder fracture in the past as well. Will keep on her current dose of Lasix - she knows to be in touch if she starts getting short of breath - she would need a CXR and escalation of diuretics.   4.  Abnormal Abd [place] - CT of the abdomen noted from yesterday.   5. Profound anemia - may still be needing BMS - plans for colonoscopy have been placed on hold due to the fall and laceration - CT of the abdomen with no acute process noted.   6.  CAD/aortic valve disease - remote CABG/AVR - managed medically - no active symptoms and would still favor ongoing medical therapy given her degree of CKD  7. CKD - stage IV - she has had recent visit with Dr. Posey Pronto. This was stable.   8. History of orthostasis - reports negative adrenal work up - I see where she had an [place] from March 2019 - not sure if we need to re-consider this going forward. Still may need to consider. Now on Midodrine TID. BP staying right below 140 at home and she has had no further spells.   9. PAF - in sinus on exam - on amiodarone .  10.Carotid disease-noted 1 to 39% bilateral stenosis from study 10/2017 -will need repeat study 10/2019- not discussed today  11. Prior breast cancer - recent abnormal  mammogram - s/p biopsies. Abnormality noted on the CT - she has had recent biopsies that were negative.   12. Recent fall/dizziness with injury - see above.   17. COVID-19 Education: The signs and symptoms of COVID-19 were discussed with the patient and how to seek care for testing (follow up with PCP or arrange E-visit).  The importance of social distancing, staying at home, hand hygiene and wearing a mask  when out in public were discussed today.  Current medicines are reviewed with the patient today.  The patient does not have concerns regarding medicines other than what has been noted above.  The following changes have been made:  See above.  Labs/ tests ordered today include:   No orders of the defined types were placed in this encounter.    Disposition:   FU with me as planned later this month.  Patient is agreeable to this plan and will call if any problems develop in the interim.   SignedTruitt Merle, NP  03/18/2019 2:46 PM  [facility] [address] [place], [place]  [address] Phone: [phone] Fax: [phone]

## 2019-03-15 LAB — CUP PACEART REMOTE DEVICE CHECK
Date Time Interrogation Session: 20201003224328
Implantable Pulse Generator Implant Date: 20190322

## 2019-03-16 ENCOUNTER — Ambulatory Visit (INDEPENDENT_AMBULATORY_CARE_PROVIDER_SITE_OTHER): Payer: Medicare Other | Admitting: *Deleted

## 2019-03-16 ENCOUNTER — Telehealth: Payer: Self-pay | Admitting: *Deleted

## 2019-03-16 DIAGNOSIS — I4891 Unspecified atrial fibrillation: Secondary | ICD-10-CM | POA: Diagnosis not present

## 2019-03-16 DIAGNOSIS — I428 Other cardiomyopathies: Secondary | ICD-10-CM

## 2019-03-16 NOTE — Telephone Encounter (Signed)
Pt calling in today to let [name] know pt is getting a CT with oral contrast instead colonoscopy. Pt has upcoming appt with [name] on Wednesday, Oct 7.

## 2019-03-17 ENCOUNTER — Ambulatory Visit (HOSPITAL_BASED_OUTPATIENT_CLINIC_OR_DEPARTMENT_OTHER)
Admission: RE | Admit: 2019-03-17 | Discharge: 2019-03-17 | Disposition: A | Discharge status: Home / Self Care | Payer: Medicare Other | Source: Ambulatory Visit | Attending: Gastroenterology | Admitting: Gastroenterology

## 2019-03-17 ENCOUNTER — Other Ambulatory Visit: Payer: Self-pay

## 2019-03-17 DIAGNOSIS — K2970 Gastritis, unspecified, without bleeding: Secondary | ICD-10-CM | POA: Insufficient documentation

## 2019-03-17 DIAGNOSIS — R1013 Epigastric pain: Secondary | ICD-10-CM | POA: Insufficient documentation

## 2019-03-17 DIAGNOSIS — R110 Nausea: Secondary | ICD-10-CM

## 2019-03-18 ENCOUNTER — Ambulatory Visit (INDEPENDENT_AMBULATORY_CARE_PROVIDER_SITE_OTHER): Payer: Medicare Other | Admitting: Nurse Practitioner

## 2019-03-18 ENCOUNTER — Encounter: Payer: Self-pay | Admitting: Nurse Practitioner

## 2019-03-18 VITALS — BP 130/48 | HR 55 | Ht 65.0 in | Wt 145.0 lb

## 2019-03-18 DIAGNOSIS — I2510 Atherosclerotic heart disease of native coronary artery without angina pectoris: Secondary | ICD-10-CM

## 2019-03-18 DIAGNOSIS — J90 Pleural effusion, not elsewhere classified: Secondary | ICD-10-CM

## 2019-03-18 DIAGNOSIS — N184 Chronic kidney disease, stage 4 (severe): Secondary | ICD-10-CM

## 2019-03-18 DIAGNOSIS — I951 Orthostatic hypotension: Secondary | ICD-10-CM

## 2019-03-18 DIAGNOSIS — R55 Syncope and collapse: Secondary | ICD-10-CM

## 2019-03-18 DIAGNOSIS — I480 Paroxysmal atrial fibrillation: Secondary | ICD-10-CM

## 2019-03-18 DIAGNOSIS — Z7189 Other specified counseling: Secondary | ICD-10-CM

## 2019-03-18 NOTE — Patient Instructions (Addendum)
After Visit Summary:  We will be checking the following labs today - NONE - will plan to do on return.    Medication Instructions:    Continue with your current medicines.    If you need a refill on your cardiac medications before your next appointment, please call your pharmacy.     Testing/Procedures To Be Arranged:  N/A  Follow-Up:   See me as planned.     At [facility], you and your health needs are our priority.  As part of our continuing mission to provide you with exceptional heart care, we have created designated Provider Care Teams.  These Care Teams include your primary Cardiologist (physician) and Advanced Practice Providers (APPs -  Physician Assistants and Nurse Practitioners) who all work together to provide you with the care you need, when you need it.  Special Instructions:  . Stay safe, stay home, wash your hands for at least 20 seconds and wear a mask when out in public.  . It was good to talk with you today.  . Let's keep your weight below 150#   Call the [place] office at [phone] if you have any questions, problems or concerns.

## 2019-03-19 ENCOUNTER — Inpatient Hospital Stay: Payer: Medicare Other

## 2019-03-19 ENCOUNTER — Other Ambulatory Visit: Payer: Self-pay

## 2019-03-19 VITALS — BP 114/50 | HR 54 | Temp 98.7°F | Resp 18

## 2019-03-19 DIAGNOSIS — D638 Anemia in other chronic diseases classified elsewhere: Secondary | ICD-10-CM

## 2019-03-19 DIAGNOSIS — C50211 Malignant neoplasm of upper-inner quadrant of right female breast: Secondary | ICD-10-CM

## 2019-03-19 DIAGNOSIS — I129 Hypertensive chronic kidney disease with stage 1 through stage 4 chronic kidney disease, or unspecified chronic kidney disease: Secondary | ICD-10-CM | POA: Diagnosis not present

## 2019-03-19 DIAGNOSIS — Z170 Estrogen receptor positive status [ER+]: Secondary | ICD-10-CM

## 2019-03-19 LAB — CBC WITH DIFFERENTIAL (CANCER CENTER ONLY)
Abs Immature Granulocytes: 0.02 10*3/uL (ref 0.00–0.07)
Basophils Absolute: 0.1 10*3/uL (ref 0.0–0.1)
Basophils Relative: 1 %
Eosinophils Absolute: 0.2 10*3/uL (ref 0.0–0.5)
Eosinophils Relative: 3 %
HCT: 29.4 % — ABNORMAL LOW (ref 36.0–46.0)
Hemoglobin: 8.5 g/dL — ABNORMAL LOW (ref 12.0–15.0)
Immature Granulocytes: 0 %
Lymphocytes Relative: 9 %
Lymphs Abs: 0.6 10*3/uL — ABNORMAL LOW (ref 0.7–4.0)
MCH: 27.7 pg (ref 26.0–34.0)
MCHC: 28.9 g/dL — ABNORMAL LOW (ref 30.0–36.0)
MCV: 95.8 fL (ref 80.0–100.0)
Monocytes Absolute: 0.6 10*3/uL (ref 0.1–1.0)
Monocytes Relative: 9 %
Neutro Abs: 5.3 10*3/uL (ref 1.7–7.7)
Neutrophils Relative %: 78 %
Platelet Count: 123 10*3/uL — ABNORMAL LOW (ref 150–400)
RBC: 3.07 MIL/uL — ABNORMAL LOW (ref 3.87–5.11)
RDW: 14.6 % (ref 11.5–15.5)
WBC Count: 6.7 10*3/uL (ref 4.0–10.5)
nRBC: 0 % (ref 0.0–0.2)

## 2019-03-19 LAB — SAMPLE TO BLOOD BANK

## 2019-03-19 MED ORDER — EPOETIN ALFA-EPBX 40000 UNIT/ML IJ SOLN
40000.0000 [IU] | Freq: Once | INTRAMUSCULAR | Status: AC
  Administered 2019-03-19: 40000 [IU] via SUBCUTANEOUS
  Filled 2019-03-19: qty 1

## 2019-03-19 NOTE — Patient Instructions (Signed)

## 2019-03-23 ENCOUNTER — Telehealth: Payer: Self-pay | Admitting: Medical

## 2019-03-23 NOTE — Telephone Encounter (Signed)
LVM for pt to schedule 2 wk fu appt with provider. ( appt requested by provider)

## 2019-03-24 NOTE — Progress Notes (Signed)
[facility] Summary Report / Loop Recorder 

## 2019-03-26 ENCOUNTER — Inpatient Hospital Stay: Payer: Medicare Other

## 2019-03-26 ENCOUNTER — Ambulatory Visit: Payer: Self-pay

## 2019-03-26 ENCOUNTER — Other Ambulatory Visit: Payer: Self-pay

## 2019-03-26 ENCOUNTER — Encounter: Payer: Self-pay | Admitting: Orthopaedic Surgery

## 2019-03-26 ENCOUNTER — Ambulatory Visit (INDEPENDENT_AMBULATORY_CARE_PROVIDER_SITE_OTHER): Payer: Medicare Other | Admitting: Orthopaedic Surgery

## 2019-03-26 VITALS — BP 128/72 | HR 58 | Temp 98.7°F | Resp 18

## 2019-03-26 VITALS — BP 149/51 | HR 58 | Ht 64.5 in | Wt 148.0 lb

## 2019-03-26 DIAGNOSIS — G8929 Other chronic pain: Secondary | ICD-10-CM

## 2019-03-26 DIAGNOSIS — D638 Anemia in other chronic diseases classified elsewhere: Secondary | ICD-10-CM

## 2019-03-26 DIAGNOSIS — Z853 Personal history of malignant neoplasm of breast: Secondary | ICD-10-CM | POA: Diagnosis not present

## 2019-03-26 DIAGNOSIS — I129 Hypertensive chronic kidney disease with stage 1 through stage 4 chronic kidney disease, or unspecified chronic kidney disease: Secondary | ICD-10-CM | POA: Diagnosis not present

## 2019-03-26 DIAGNOSIS — M25512 Pain in left shoulder: Secondary | ICD-10-CM | POA: Insufficient documentation

## 2019-03-26 DIAGNOSIS — Z170 Estrogen receptor positive status [ER+]: Secondary | ICD-10-CM

## 2019-03-26 DIAGNOSIS — C50211 Malignant neoplasm of upper-inner quadrant of right female breast: Secondary | ICD-10-CM

## 2019-03-26 LAB — CBC WITH DIFFERENTIAL (CANCER CENTER ONLY)
Abs Immature Granulocytes: 0.01 10*3/uL (ref 0.00–0.07)
Basophils Absolute: 0 10*3/uL (ref 0.0–0.1)
Basophils Relative: 1 %
Eosinophils Absolute: 0.2 10*3/uL (ref 0.0–0.5)
Eosinophils Relative: 2 %
HCT: 27.4 % — ABNORMAL LOW (ref 36.0–46.0)
Hemoglobin: 7.8 g/dL — ABNORMAL LOW (ref 12.0–15.0)
Immature Granulocytes: 0 %
Lymphocytes Relative: 10 %
Lymphs Abs: 0.6 10*3/uL — ABNORMAL LOW (ref 0.7–4.0)
MCH: 26.6 pg (ref 26.0–34.0)
MCHC: 28.5 g/dL — ABNORMAL LOW (ref 30.0–36.0)
MCV: 93.5 fL (ref 80.0–100.0)
Monocytes Absolute: 0.6 10*3/uL (ref 0.1–1.0)
Monocytes Relative: 9 %
Neutro Abs: 4.8 10*3/uL (ref 1.7–7.7)
Neutrophils Relative %: 78 %
Platelet Count: 129 10*3/uL — ABNORMAL LOW (ref 150–400)
RBC: 2.93 MIL/uL — ABNORMAL LOW (ref 3.87–5.11)
RDW: 14.7 % (ref 11.5–15.5)
WBC Count: 6.2 10*3/uL (ref 4.0–10.5)
nRBC: 0 % (ref 0.0–0.2)

## 2019-03-26 LAB — SAMPLE TO BLOOD BANK

## 2019-03-26 MED ORDER — EPOETIN ALFA-EPBX 40000 UNIT/ML IJ SOLN
40000.0000 [IU] | Freq: Once | INTRAMUSCULAR | Status: AC
  Administered 2019-03-26: 40000 [IU] via SUBCUTANEOUS
  Filled 2019-03-26: qty 1

## 2019-03-26 NOTE — Patient Instructions (Signed)

## 2019-03-26 NOTE — Progress Notes (Signed)
Office Visit Note   Patient: Hannah [name]           Date of Birth: 12-05-1945           MRN: [id number] Visit Date: 03/26/2019              Requested by: Mackie Pai, PA-C [place],  [place] [id number] PCP: Mackie Pai, PA-C   Assessment & Plan: Visit Diagnoses:  1. Chronic left shoulder pain   2. Acute pain of left shoulder   3. History of breast cancer in female     Plan:   #1:  CT Scan without contrast of her left shoulder to evaluate for fracture #2: Follow back up after CT scan for further delineation of her injury.   Follow-Up Instructions: No follow-ups on file.   Orders:  Orders Placed This Encounter  Procedures  . XR Shoulder Left   No orders of the defined types were placed in this encounter.     Procedures: No procedures performed   Clinical Data: No additional findings.   Subjective: Chief Complaint  Patient presents with  . Left Shoulder - Pain   HPI Patient presents today for left shoulder pain. She fell in a parking lot 3 week ago and landed on her left side. She went to [facility] ED for a head laceration. She said that her shoulder has continued to hurt anteriorly when she moves her shoulder. She cannot lift her arm above her head or reach behind. She is taking Tylenol. She is right hand dominant. No numbness or tingling. She has noticed that her arm is weak and now unable to pick up a glass of water. She is anemic and being treated with erythropoietin.  Has had breast cancer and treated with chemo.   Review of Systems  Constitutional: Negative for fatigue.  HENT: Negative for ear pain.   Eyes: Negative for pain.  Respiratory: Negative for shortness of breath.   Cardiovascular: Negative for leg swelling.  Gastrointestinal: Negative for constipation and diarrhea.  Endocrine: Negative for cold intolerance and heat intolerance.  Genitourinary: Negative for difficulty urinating.  Musculoskeletal: Negative  for joint swelling.  Skin: Negative for rash.  Allergic/Immunologic: Negative for food allergies.  Neurological: Positive for weakness.  Hematological: Does not bruise/bleed easily.  Psychiatric/Behavioral: Positive for sleep disturbance.     Objective: Vital Signs: BP (!) 149/51   Pulse (!) 58   Ht 5' 4.5" (1.638 m)   Wt 148 lb (67.1 kg)   BMI 25.01 kg/m   Physical Exam Constitutional:      Appearance: She is well-developed. She is ill-appearing.  HENT:     Head: Normocephalic.     Comments: Contusion to left scalp Eyes:     Pupils: Pupils are equal, round, and reactive to light.  Pulmonary:     Effort: Pulmonary effort is normal.  Skin:    General: Skin is warm and dry.  Neurological:     Mental Status: She is alert and oriented to person, place, and time.  Psychiatric:        Behavior: Behavior normal.     Ortho Exam  Marked pain with range of motion of the shoulder.  I could only get her passively to about 45 degrees of abduction.  50 to 60 degrees of forward flexion.  External rotation with the arm at side to neutral.  She does have strength with abduction and forward flexion but it has breakaway weakness.  Specialty Comments:  No specialty comments available.  Imaging: Xr Shoulder Left  Result Date: 03/26/2019 4 view x-ray of the left shoulder reveals what appears to be old fracture of the proximal humerus with what appears to be some impaction of the humeral head.  There are some degenerative changes also noted in the glenohumeral joint.  Osteopenia is also noted with some cystic changes.  Cannot rule out a acute fracture at this time.    PMFS History: Current Outpatient Medications  Medication Sig Dispense Refill  . ACCU-CHEK GUIDE test strip Use to test blood sugar 3 times daily 100 each 3  . acetaminophen (TYLENOL) 325 MG tablet Take 2 tablets (650 mg total) by mouth every 6 (six) hours as needed for mild pain (or Fever >/= 101).    Marland Kitchen albuterol  (PROVENTIL HFA;VENTOLIN HFA) 108 (90 Base) MCG/ACT inhaler Inhale 2 puffs into the lungs every 6 (six) hours as needed for wheezing or shortness of breath.    Marland Kitchen amiodarone (PACERONE) 200 MG tablet Take 1 tablet (200 mg total) by mouth daily. 90 tablet 3  . apixaban (ELIQUIS) 5 MG TABS tablet Take 1 tablet (5 mg total) by mouth 2 (two) times daily. 180 tablet 2  . Blood Glucose Monitoring Suppl (ACCU-CHEK AVIVA PLUS) w/Device KIT Use accu chek aviva meter to check blood sugar three times daily. 1 kit 0  . budesonide-formoterol (SYMBICORT) 160-4.5 MCG/ACT inhaler INHALE TWO PUFFS INTO THE LUNGS TWICE DAILY 3 Inhaler 1  . epoetin alfa-epbx (RETACRIT) 16109 UNIT/ML injection Inject 40,000 Units into the skin every Friday.    Marland Kitchen exemestane (AROMASIN) 25 MG tablet TAKE ONE TABLET BY MOUTH DAILY AFTER BREAKFAST 90 tablet 3  . ezetimibe (ZETIA) 10 MG tablet TAKE ONE TABLET BY MOUTH EVERY DAY 90 tablet 3  . famotidine (PEPCID) 20 MG tablet One after supper 30 tablet 11  . furosemide (LASIX) 20 MG tablet Take 20 mg every other day. 30 tablet 2  . insulin glargine (LANTUS) 100 UNIT/ML injection Inject 16 units under the skin once daily in the morning. 20 mL 3  . insulin lispro (HUMALOG) 100 UNIT/ML injection Inject 4-6 units under the skin three times daily before meals. 20 mL 3  . Insulin Pen Needle 32G X 4 MM MISC Use as instructed to inject insulin 4 times daily. 400 each 3  . loratadine (CLARITIN) 10 MG tablet Take 10 mg by mouth daily as needed for allergies.     . midodrine (PROAMATINE) 2.5 MG tablet Take 1 tablet (2.5 mg total) by mouth 2 (two) times daily with a meal. 60 tablet 3  . potassium chloride SA (K-DUR) 20 MEQ tablet Take 1 tablet (20 mEq total) by mouth daily. 90 tablet 3  . rosuvastatin (CRESTOR) 40 MG tablet Take 1 tablet (40 mg total) by mouth at bedtime. 90 tablet 3  . zolpidem (AMBIEN) 5 MG tablet TAKE ONE TABLET BY MOUTH AT BEDTIME AS NEEDED FOR SLEEP 14 tablet 0  . metoprolol tartrate  (LOPRESSOR) 25 MG tablet Take 1 tablet (25 mg total) by mouth 2 (two) times daily. 180 tablet 3  . nitroGLYCERIN (NITROSTAT) 0.4 MG SL tablet Place 1 tablet (0.4 mg total) under the tongue every 5 (five) minutes as needed for chest pain. 30 tablet 3   No current facility-administered medications for this visit.     Patient Active Problem List   Diagnosis Date Noted  . Pain in left shoulder 03/26/2019  . History of breast cancer in female 03/26/2019  .  Hypokalemia 02/20/2019  . Acute exacerbation of CHF (congestive heart failure) ([place]) 02/19/2019  . Pleural effusion on right 09/25/2018  . Thrombocytopenia ([place]) 09/25/2018  . Cholecystitis with cholelithiasis 07/07/2018  . Prolonged Q-T interval on ECG 05/21/2018  . Atrial fibrillation ([place]) 01/23/2018  . Closed fracture of head of left humerus 09/09/2017  . Syncope 09/06/2017  . Seizure-like activity ([place]) 09/01/2017  . AKI (acute kidney injury) ([place]) 09/01/2017  . Adrenal insufficiency ([place]) 07/03/2016  . Seizure ([place]) 06/29/2016  . Chemotherapy-induced peripheral neuropathy ([place]) 04/27/2016  . 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 Osteomyelitis of toe of left foot ([place]) 04/06/2016  . Mania ([place]) 03/05/2016  . GERD (gastroesophageal reflux disease) 02/29/2016  . Chemotherapy induced nausea and vomiting 02/24/2016  . Port catheter in place 02/17/2016  . Sensorineural hearing loss (SNHL) of both ears 01/06/2016  . Breast cancer of upper-inner quadrant of right female breast ([place]) 12/16/2015  . Osteopenia of multiple sites 10/19/2015  . CKD (chronic kidney disease), stage IV ([place]) 09/24/2013  . Chronic diastolic CHF (congestive heart failure) ([place]) 09/24/2013  . Acute on chronic combined systolic and diastolic congestive heart failure ([place]) 07/17/2013  . Atrial tachycardia ([place]) 04/03/2013  . CAD (coronary artery disease) 01/24/2011  . Chronic coronary artery disease 01/24/2011  . History of aortic valve replacement  10/27/2010  . Hyperlipidemia   . DOE (dyspnea on exertion)   . Nonischemic cardiomyopathy ([place])   . Anemia of chronic disease   . Type 2 diabetes mellitus ([place])   . Iron deficiency anemia   . Allergic rhinitis 10/14/2009  . Essential hypertension 05/12/2009  . H/O atrial tachycardia 05/12/2009  . Asthma 05/12/2009   Past Medical History:  Diagnosis Date  . Abnormally small mouth   . Allergic rhinitis 10/14/2009   Qualifier: Diagnosis of  By: Lamonte Sakai MD, Rose Fillers   Overview:  Overview:  Qualifier: Diagnosis of  By: Lamonte Sakai MD, Rose Fillers  Last Assessment & Plan:  Please continue Xyzal and Nasacort as you have been using them  . Anemia   . Asthma 05/12/2009   10/12/2014 p extensive coaching HFA effectiveness =    75% s spacer    Overview:  Overview:  10/12/2014 p extensive coaching HFA effectiveness =    75% s spacer   Last Assessment & Plan:  Please continue Symbicort 2 puffs twice a day. Remember to rinse and gargle after taking this medication. Take albuterol 2 puffs up to every 4 hours if needed for shortness of breath.  Follow with Dr Lamonte Sakai in 6 month  . Bilateral carotid artery stenosis    a. mild 1-39% by duplex 10/2017, due 2021.  . Breast cancer ([place]) 01/18/2016   right breast  . CAD (coronary artery disease) 01/24/2011   a. s/p CABG 1998 with redo 2009.  Marland Kitchen Chemotherapy induced nausea and vomiting 02/24/2016  . Chemotherapy-induced peripheral neuropathy ([place]) 04/27/2016  . 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 Chronic diastolic CHF (congestive heart failure) ([place]) 09/24/2013  . CKD (chronic kidney disease), stage IV ([place]) 09/24/2013  . Closed fracture of head of left humerus 09/09/2017  . Complication of anesthesia   . Essential hypertension 05/12/2009   Qualifier: Diagnosis of  By: Lamonte Sakai MD, Rose Fillers   . Gallstones   . GERD (gastroesophageal reflux disease)   . Glaucoma   . H/O atrial tachycardia 05/12/2009   Qualifier: History of  By: Lamonte Sakai MD, Rose Fillers   Overview:  Overview:   Qualifier: History of  By: Lamonte Sakai MD, Rose Fillers  Last Assessment & Plan:  There was no evidence of this on her cardiac monitor. I would recommend watchful waiting.   Marland Kitchen History of non-ST elevation myocardial infarction (NSTEMI)    Sept 2014--  thought to be type II HTN w/ LHC without infarct related artery and patent grafts  . History of radiation therapy 05/24/16-07/26/16   right breast 50.4 Gy in 28 fractions, right breast boost 10 Gy in 5 fractions  . Hyperlipidemia   . Hypertension    Resolved.  . Iron deficiency anemia   . Mania ([place]) 03/05/2016  . Moderate persistent asthma    pulmologist-  Dr. Malvin Johns  . Osteomyelitis of toe of left foot ([place]) 04/06/2016  . Osteopenia of multiple sites 10/19/2015  . PAF (paroxysmal atrial fibrillation) ([place])   . Personal history of chemotherapy 2017  . Personal history of radiation therapy 2017  . PONV (postoperative nausea and vomiting)   . Port catheter in place 02/17/2016  . Psoriasis    right leg  . Renal calculus, right   . S/P AVR    prosthesis valve placement 2009 at same time re-do CABG  . Seizure-like activity ([place]) 09/01/2017  . Sensorineural hearing loss (SNHL) of both ears 01/06/2016  . 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 Syncope 09/06/2017  . Type 2 diabetes mellitus ([place])    monitored by dr Dwyane Dee    Family History  Problem Relation Age of Onset  . Heart disease Father   . Heart failure Father   . Diabetes Maternal Grandmother   . Heart disease Maternal Grandmother   . Diabetes Son   . Healthy Brother        #1  . Heart attack Brother        #2  . Heart disease Brother        #2  . Colon cancer Neg Hx   . Esophageal cancer Neg Hx     Past Surgical History:  Procedure Laterality Date  . AORTIC VALVE REPLACEMENT  [facility]   [facility] same [facility] is [facility]  . BREAST LUMPECTOMY Right 01/18/2016  . BREAST LUMPECTOMY WITH RADIOACTIVE SEED AND SENTINEL LYMPH NODE BIOPSY Right 01/18/2016    Procedure: RIGHT BREAST LUMPECTOMY WITH RADIOACTIVE SEED AND SENTINEL LYMPH NODE BIOPSY;  Surgeon: DAlphonsa Overall MD;  [facility]  Service: General;  Laterality: Right;  . CARDIAC CATHETERIZATION  03/23/2008   Pre-redo CABG: L main OK, LAD (T), CFX (T), OM1 99%, RCA (T), LIMA-LAD OK, SVG-OM(?3) OK w/ little florw to OM2, SVG-RCA OK. EF NL  . CARPAL TUNNEL RELEASE    . CHOLECYSTECTOMY N/A 07/07/2018   Procedure: LAPAROSCOPIC CHOLECYSTECTOMY WITH INTRAOPERATIVE CHOLANGIOGRAM ERAS PATHWAY;  Surgeon: NAlphonsa Overall MD;  Location: [facility];  Service: General;  Laterality: N/A;  . COLONOSCOPY     around 2015. Possibly with [facility]   . CORONARY ARTERY BYPASS GRAFT  1998 &  re-do 2009   Had LIMA to DX/LAD, SVG [facility] 2 marginal [facility] SVG to 3rd OM and PD at time of redo  . CYSTOSCOPY W/ URETERAL STENT PLACEMENT Right 12/20/2014   Procedure: CYSTOSCOPY WITH RETROGRADE PYELOGRAM/URETERAL STENT PLACEMENT;  Surgeon: PCleon Gustin MD;  [facility]  Service: Urology;  Laterality: Right;  . ESOPHAGOGASTRODUODENOSCOPY     many years ago per patient   . ESOPHAGOGASTRODUODENOSCOPY ENDOSCOPY  06/17/2018  . EYE SURGERY Bilateral    cataracts  .  HOLMIUM LASER APPLICATION Right 2/70/6237   Procedure:  HOLMIUM LASER LITHOTRIPSY;  Surgeon: Cleon Gustin, MD;  Location: [facility];  Service: Urology;  Laterality: Right;  . IR THORACENTESIS ASP PLEURAL SPACE W/IMG GUIDE  10/03/2018  . LEFT HEART CATHETERIZATION WITH CORONARY/GRAFT ANGIOGRAM N/A 02/23/2013   Procedure: LEFT HEART CATHETERIZATION WITH Beatrix Fetters;  Surgeon: Blane Ohara, MD;  Location: [facility] CATH LAB;  Service: Cardiovascular;  Laterality: N/A;  . LOOP RECORDER INSERTION N/A 08/30/2017   Procedure: LOOP RECORDER INSERTION;  Surgeon: Evans Lance, MD;  Location: [facility] CV LAB;  Service: Cardiovascular;  Laterality: N/A;  . PORTACATH PLACEMENT Left 01/18/2016    Procedure: INSERTION PORT-A-CATH;  Surgeon: Alphonsa Overall, MD;  Location: [facility];  Service: General;  Laterality: Left;  . portacath removal    . TONSILLECTOMY    . TRANSTHORACIC ECHOCARDIOGRAM  02-24-2013      mild LVH,  ef 50-55%/  AV bioprosthesis was present with very mild stenosis and no regurg., mean grandient 58mHg, peak grandient 245mg /  mild MR/  mild LAE and RAE/  moderate TR  . TUBAL LIGATION     Social History   Occupational History  . Occupation: retired  Tobacco Use  . Smoking status: Never Smoker  . Smokeless tobacco: Never Used  Substance and Sexual Activity  . Alcohol use: No  . Drug use: No  . Sexual activity: Yes    Birth control/protection: None

## 2019-03-26 NOTE — Progress Notes (Signed)
CARDIOLOGY OFFICE NOTE  Date:  03/31/2019    Hannah [name] Date of Birth: [birth year] Medical Record #[id number]  PCP:  [name], DO ([place])  Cardiologist:  [place]    Chief Complaint  Patient presents with   Follow-up    History of Present Illness: Hannah [name] is a 73 y.o. female who presents today for a follow up visit.  Seen for Dr. Lovena Le.   She has a very complex history - this includes ahistory ofCAD s/p CABG (1998 with redo 2009), bioprosthetic AVR in 5051, chronic diastolic CHF, asthma, carotid artery disease (1-39% by duplex 10/2017, due 2021), CKD stage IV, PAF- has been on Multaq in the past and now on amiodarone, atrial tachycardia, breast CA, orthostasis,chronicanemia, thrombocytopenia, mild dilation of ascending aorta by echo 10/2018, recurrent syncope/orthostasis,recurrentpleural effusion requiring thoracentesis4/2020, HTN, GERD,&HLD.   I have followed her for many years - she has had periodic issues with volume overload, orthostasis and tachy palpitations.Last cath in 2014 - grafts patent. Myoview in 2015 without ischemia.  Over the past few months she has had more issues with her pleural effusion - diuretics caused worsening CKD/orthostasis. She has gotten more anemic - back to hematology and now on therapy. Concern for MDS and will be having bone marrow biopsy - also needing colonoscopy due to profound anemia (HGB down to 6 which is highly unusual for Hannah [name]).Most recent abdominal ultrasound noted and concerning for liver abnormality. Recent mammogram also recently noted and abnormal - ended up having repeat biopsy.  About 2 months ago - we had to restartlow dosediuretics for recurringpleuraleffusion - she was better at my visit but I was still quite concerned that there is "something else going on" that has just not been found yet. She has had progressive failure to thrive. 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 We kept our follow up for here - she had been home a few days - was doing better - finishing antibiotics but surprised at the diagnosis of pneumonia.  She was getting ready to have colonoscopy. She was seeing Heme/Onc. She was not on Midodrine and had not been orthostatic. She had had an abnormal mammogram - had 4 biopsies which turned out ok.  I was going to see her the following week with a CXR (03/04/2019). She completed the CXR- this showed improvement in the effusion/pneumonia. She was on her way here - got out of her car and was walking in to the building - got dizzy - and fell - significant laceration to the back of her head. Ended up calling EMS and she was taken to the ER - CT of the head was ok.  Her ILRmonitorwas noted to beup to date through thatmorning of the event.No symptom, tachy, pause, brady, or AF episodes detected in recent months.   We were then in touch by phone - BP had been low - we added the Midodrine and cut the Lasix back.She was pretty weak on follow up- very fatigued. BP still low - we cut her Lasix back more.   She has seen Dr. Posey Pronto with nephrology - he thought things were stable. Her colonoscopy was cancelled - abdominal CT was done - see below.   Seen about 2 weeks ago - got her staples out of her head - she was doing a little better -  little stronger.   The patient does not have symptoms concerning for COVID-19 infection (fever, chills, cough, or new shortness of breath).   Comes in today. Here with her husband - I am seeing both of them today. She is doing ok. Still with some "weak" spells - correlates with low BP - treats with fluids and rest and it will resolve. Happens about every day. She has seen Dr. Durward Fortes for her left shoulder (she fractured this in the past due to a syncopal spell) - she is  for CT later this week - may end up getting an injection - she is not interested in any surgery. Blood count continues to drift - seeing Heme/Onc later this week. Possible bone marrow biopsy in the future. Breathing is ok. She has had to use extra Lasix just once since last here. No chest pain. Not too much in the way of palpitations. She is still quite scared to go outside alone - she is frightened of falling. She is doing more in the way of cooking/laundry. She is switching her PCP to [facility] - she had a nice "meet and greet" visit.   Past Medical History:  Diagnosis Date   Abnormally small mouth    Allergic rhinitis 10/14/2009   Qualifier: Diagnosis of  By: Lamonte Sakai MD, Rose Fillers   Overview:  Overview:  Qualifier: Diagnosis of  By: Lamonte Sakai MD, Rose Fillers  Last Assessment & Plan:  Please continue Xyzal and Nasacort as you have been using them   Anemia    Asthma 05/12/2009   10/12/2014 p extensive coaching HFA effectiveness =    75% s spacer    Overview:  Overview:  10/12/2014 p extensive coaching HFA effectiveness =    75% s spacer   Last Assessment & Plan:  Please continue Symbicort 2 puffs twice a day. Remember to rinse and gargle after taking this medication. Take albuterol 2 puffs up to every 4 hours if needed for shortness of breath.  Follow with Dr Lamonte Sakai in 6 month   Bilateral carotid artery stenosis    a. mild 1-39% by duplex 10/2017, due 2021.   Breast cancer ([place]) 01/18/2016   right breast   CAD (coronary artery disease) 01/24/2011   a. s/p CABG 1998 with redo 2009.   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) 09/24/2013   CKD (chronic kidney disease), stage IV ([place]) 09/24/2013   Closed fracture of head of left humerus 07/14/6331   Complication of anesthesia    Essential hypertension 05/12/2009    Qualifier: Diagnosis of  By: Lamonte Sakai MD, Rose Fillers    Gallstones    GERD (gastroesophageal reflux disease)    Glaucoma    H/O atrial tachycardia 05/12/2009   Qualifier: History of  By: Lamonte Sakai MD, Rose Fillers   Overview:  Overview:  Qualifier: History of  By: Lamonte Sakai MD, Rose Fillers  Last Assessment & Plan:  There was no evidence of this on her cardiac monitor. I would recommend watchful waiting.    History of non-ST elevation myocardial infarction (NSTEMI)    Sept 2014--  thought to be type II HTN w/ LHC without infarct related artery and patent grafts   History of radiation therapy 05/24/16-07/26/16   right breast 50.4 Gy in 28 fractions, right breast boost 10 Gy in 5 fractions   Hyperlipidemia    Hypertension    Resolved.  Iron deficiency anemia    Mania ([place]) 03/05/2016   Moderate persistent asthma    pulmologist-  Dr. Malvin Johns   Osteomyelitis of toe of left foot ([place]) 04/06/2016   Osteopenia of multiple sites 10/19/2015   PAF (paroxysmal atrial fibrillation) ([place])    Personal history of chemotherapy 2017   Personal history of radiation therapy 2017   PONV (postoperative nausea and vomiting)    Port catheter in place 02/17/2016   Psoriasis    right leg   Renal calculus, right    S/P AVR    prosthesis valve placement 2009 at same time re-do CABG   Seizure-like activity ([place]) 09/01/2017   Sensorineural hearing loss (SNHL) of both ears 01/06/2016   Stroke ([place])    residual rt hearing loss   Syncope 09/06/2017   Type 2 diabetes mellitus ([place])    monitored by dr Dwyane Dee    Past Surgical History:  Procedure Laterality Date   AORTIC VALVE REPLACEMENT  2009   [facility]52[facility] (done same time is CABG)   BREAST LUMPECTOMY Right 01/18/2016   BREAST LUMPECTOMY WITH RADIOACTIVE SEED AND SENTINEL LYMPH NODE BIOPSY Right 01/18/2016   Procedure: RIGHT BREAST LUMPECTOMY WITH RADIOACTIVE SEED AND SENTINEL LYMPH NODE BIOPSY;  Surgeon: DAlphonsa Overall MD;   [facility]  Service: General;  Laterality: Right;   CARDIAC CATHETERIZATION  03/23/2008   Pre-redo CABG: L main OK, LAD (T), CFX (T), OM1 99%, RCA (T), LIMA-LAD OK, SVG-OM(?3) OK w/ little florw to OM2, SVG-RCA OK. EF NL   CARPAL TUNNEL RELEASE     CHOLECYSTECTOMY N/A 07/07/2018   Procedure: LAPAROSCOPIC CHOLECYSTECTOMY WITH INTRAOPERATIVE CHOLANGIOGRAM ERAS PATHWAY;  Surgeon: NAlphonsa Overall MD;  Location: [facility];  Service: General;  Laterality: N/A;   COLONOSCOPY     around 2015. [place]  re-do 2009   Had LIMA to DX/LAD, SVG to 2 marginal branches [facility]; SVG to 3rd OM and PD at time of redo   CYSTOSCOPY W/ URETERAL STENT PLACEMENT Right 12/20/2014   Procedure: CYSTOSCOPY WITH RETROGRADE PYELOGRAM/URETERAL STENT PLACEMENT;  Surgeon: PCleon Gustin MD;  [facility]  Service: Urology;  Laterality: Right;   ESOPHAGOGASTRODUODENOSCOPY     many years ago per patient    ESOPHAGOGASTRODUODENOSCOPY ENDOSCOPY  06/17/2018   EYE SURGERY Bilateral    cataracts   HOLMIUM LASER APPLICATION Right 77/71/1657  Procedure:  HOLMIUM LASER LITHOTRIPSY;  Surgeon: PCleon Gustin MD;  [facility]  Service: Urology;  Laterality: Right;   IR THORACENTESIS ASP PLEURAL SPACE W/IMG GUIDE  10/03/2018   LEFT HEART CATHETERIZATION WITH CORONARY/GRAFT ANGIOGRAM N/A 02/23/2013   Procedure: LEFT HEART CATHETERIZATION WITH CBeatrix Fetters  Surgeon: MBlane Ohara MD;  [facility];  Service: Cardiovascular;  Laterality: N/A;   LOOP RECORDER INSERTION N/A 08/30/2017   Procedure: LOOP RECORDER INSERTION;  Surgeon: TEvans Lance MD;  [facility];  Service: Cardiovascular;  Laterality: N/A;   PORTACATH PLACEMENT Left 01/18/2016   Procedure: INSERTION PORT-A-CATH;  Surgeon: DAlphonsa Overall MD;  [facility]  Service: General;  Laterality: Left;     portacath removal     TONSILLECTOMY     TRANSTHORACIC ECHOCARDIOGRAM  02-24-2013      mild LVH,  ef 50-55%/  AV bioprosthesis was present with very mild stenosis and no regurg., mean grandient 154mg, peak grandient 202m /  mild MR/  mild LAE and RAE/  moderate TR   TUBAL LIGATION       Medications: Current Meds  Medication Sig   ACCU-CHEK GUIDE test strip Use to test blood sugar 3 times daily   acetaminophen (TYLENOL) 325 MG tablet Take 2 tablets (650 mg total) by mouth every 6 (six) hours as needed for mild pain (or Fever >/= 101).   albuterol (PROVENTIL HFA;VENTOLIN HFA) 108 (90 Base) MCG/ACT inhaler Inhale 2 puffs into the lungs every 6 (six) hours as needed for wheezing or shortness of breath.   amiodarone (PACERONE) 200 MG tablet Take 1 tablet (200 mg total) by mouth daily.   apixaban (ELIQUIS) 5 MG TABS tablet Take 1 tablet (5 mg total) by mouth 2 (two) times daily.   Blood Glucose Monitoring Suppl (ACCU-CHEK AVIVA PLUS) w/Device KIT Use accu chek aviva meter to check blood sugar three times daily.   budesonide-formoterol (SYMBICORT) 160-4.5 MCG/ACT inhaler INHALE TWO PUFFS INTO THE LUNGS TWICE DAILY   epoetin alfa-epbx (RETACRIT) 00867 UNIT/ML injection Inject 40,000 Units into the skin every Friday.   exemestane (AROMASIN) 25 MG tablet TAKE ONE TABLET BY MOUTH DAILY AFTER BREAKFAST   ezetimibe (ZETIA) 10 MG tablet TAKE ONE TABLET BY MOUTH EVERY DAY   famotidine (PEPCID) 20 MG tablet One after supper   furosemide (LASIX) 20 MG tablet Take 20 mg every other day.   insulin glargine (LANTUS) 100 UNIT/ML injection Inject 16 units under the skin once daily in the morning.   insulin lispro (HUMALOG) 100 UNIT/ML injection Inject 4-6 units under the skin three times daily before meals.   Insulin Pen Needle 32G X 4 MM MISC Use as instructed to inject insulin 4 times daily.   loratadine (CLARITIN) 10 MG tablet Take 10 mg by mouth daily as needed for allergies.     midodrine (PROAMATINE) 2.5 MG tablet Take 1 tablet (2.5 mg total) by mouth 3 (three) times daily with meals.   potassium chloride SA (K-DUR) 20 MEQ tablet Take 1 tablet (20 mEq total) by mouth daily.   rosuvastatin (CRESTOR) 40 MG tablet Take 1 tablet (40 mg total) by mouth at bedtime.   zolpidem (AMBIEN) 5 MG tablet TAKE ONE TABLET BY MOUTH AT BEDTIME AS NEEDED FOR SLEEP   [DISCONTINUED] midodrine (PROAMATINE) 2.5 MG tablet Take 1 tablet (2.5 mg total) by mouth 2 (two) times daily with a meal.       Allergies: Allergies  Allergen Reactions   Amoxicillin Rash and Other (See Comments)    Tolerates Cephalosporins Has patient had a PCN reaction causing immediate rash, facial/tongue/throat swelling, SOB or lightheadedness with hypotension: Yes Has patient had a PCN reaction causing severe rash involving mucus membranes or skin necrosis: Yes Has patient had a PCN reaction that required hospitalization No Has patient had a PCN reaction occurring within the last 10 years: No If all of the above answers are "NO", then may proceed with Cephalosporin use.    Tape Other (See Comments)    Must use paper tape   Aldactone [Spironolactone] Other (See Comments)    CKD/hypokalemia   Imdur [Isosorbide Dinitrate] Other (See Comments)    Headache/severe hypotension/Syncope   Arimidex [Anastrozole] Nausea Only   Latex Itching and Other (See Comments)    (Dentist office)   Tetracycline Rash    Social History: The patient  reports that she has never smoked. She has never used smokeless tobacco. She reports that she does not drink alcohol or use drugs.  Family History: The patient's family history includes Diabetes in her maternal grandmother and son; Healthy in her brother; Heart attack in her brother; Heart disease in her brother, father, and maternal grandmother; Heart failure in her father.   Review of Systems: Please see the history of present illness.   All other systems are reviewed  and negative.   Physical Exam: VS:  BP 122/60 (BP Location: Left Arm, Patient Position: Sitting, Cuff Size: Normal)    Pulse (!) 57    Ht 5' 5"  (1.651 m)    Wt 150 lb 12.8 oz (68.4 kg)    SpO2 97% Comment: at rest   BMI 25.09 kg/m  .  BMI Body mass index is 25.09 kg/m.  Wt Readings from Last 3 Encounters:  03/31/19 150 lb 12.8 oz (68.4 kg)  03/26/19 148 lb (67.1 kg)  03/18/19 145 lb (65.8 kg)    General: Pleasant. Looks chronically ill but still a little stronger today. Color is pale. HEENT: Normal. The hematoma on the upper left of her head is slowly decreasing in size.  Neck: Supple, no JVD, carotid bruits, or masses noted.  Cardiac: Regular rate and rhythm.  No edema.  Respiratory:  Lungs are fairlyclear to auscultation bilaterally with normal work of breathing. Good air exchange noted in both bases today.  GI: Soft and nontender.  MS: No deformity or atrophy. Gait and ROM intact.  Skin: Warm and dry. Color is pale. Neuro:  Strength and sensation are intact and no gross focal deficits noted.  Psych: Alert, appropriate and with normal affect.   LABORATORY DATA:  EKG:  EKG is ordered today. This demonstrates sinus bradycardia with HR of 57.  Lab Results  Component Value Date   WBC 6.2 03/26/2019   HGB 7.8 (L) 03/26/2019   HCT 27.4 (L) 03/26/2019   PLT 129 (L) 03/26/2019   GLUCOSE 155 (H) 03/04/2019   CHOL 106 12/02/2018   TRIG 97 12/02/2018   HDL 34 (L) 12/02/2018   LDLDIRECT 53.0 10/08/2016   LDLCALC 53 12/02/2018   ALT 27 03/04/2019   AST 49 (H) 03/04/2019   NA 136 03/04/2019   K 4.4 03/04/2019   CL 100 03/04/2019   CREATININE 2.32 (H) 03/04/2019   BUN 35 (H) 03/04/2019   CO2 26 03/04/2019   TSH 2.900 01/19/2019   INR 1.9 (H) 03/04/2019   HGBA1C 5.6 02/09/2019   MICROALBUR 53.4 (H) 05/12/2018     BNP (last 3 results) Recent Labs    03/04/19 1429  BNP 273.4*    ProBNP (last 3 results) Recent Labs    10/28/18 1041 02/19/19 1126 02/23/19 1221    PROBNP 364.0* 2,996* 2,825*     Other Studies Reviewed Today:  CT ABDOMEN IMPRESSION 03/2019: Chest Impression:  1. Mild RIGHT basilar atelectasis and small RIGHT effusion. 2. Increased skin thickening in the lateral RIGHT breast. Recommend clinical correlation. 3. Stable seroma in the RIGHT breast.  Abdomen / Pelvis Impression:  1. No acute findings in the abdomen pelvis. 2. Nonobstructing calculi in the LEFT renal pelvis. 3. Degenerative endplate change in the lumbar spine similar to comparison CT 2019. 4. Coronary artery calcification and Aortic Atherosclerosis (ICD10-I70.0).   Electronically Signed By: Suzy Bouchard M.D. On: 03/17/2019 09:59   CXRIMPRESSION9/04/2019: Small right effusion and right lower lobe airspace disease unchanged. Negative for edema.   Electronically Signed By: Franchot Gallo M.D. On: 02/20/2019 15:39   ABD [place] 02/17/2019 Other findings: There is a somewhat lobulated fluid collection identified along the  inferior aspect of the liver. This measures 2.6 x 3.4 x 4.3 cm. This was not seen on the prior CT examination and may be related to a focal fluid collection from prior cholecystectomy. Mild perihepatic ascites is noted.  Note is made of right-sided pleural effusion.  IMPRESSION: Status post cholecystectomy. There is a lobulated cystic area as described above along the inferior right hepatic margin. This could be related to the prior surgery possibly representing a small seroma. CT may be helpful for further evaluation.  Small right pleural effusion.  Mild ascites.  Increased echogenicity within the liver likely related to fatty infiltration or underlying hepatocellular disease.  Electronically Signed By: Inez Catalina M.D. On: 02/17/2019 10:18  Breast Ultrasound 01/2019 IMPRESSION: 1. New irregular hypoechoic mass along the posterior margin of the scar right breast 8 o'clock position.  Findings are nonspecific however concerning for the possibility of localized recurrence. 2. Within the outer aspect of the right axilla there are two adjacent irregular hypoechoic masses just deep to the skin which are nonspecific. These may represent focal fat necrosis or potentially recurrent or metastatic disease.  RECOMMENDATION: 1. Ultrasound-guided core needle biopsy right breast mass 8 o'clock position just deep to the scar. 2. Ultrasound-guided core needle biopsy of both irregular hypoechoic masses within the outer aspect of the right axilla.     ECHOIMPRESSIONS5/2020  1. The left ventricle has normal systolic function with an ejection fraction of 60-65%. The cavity size was normal. There is mildly increased left ventricular wall thickness. Left ventricular diastolic Doppler parameters are consistent with  pseudonormalization. 2. The right ventricle has normal systolic function. The cavity was mildly enlarged. 3. Left atrial size was moderately dilated. 4. Right atrial size was mildly dilated. 5. The mitral valve is grossly normal. Mild thickening of the mitral valve leaflet. There is mild mitral annular calcification present. 6. The tricuspid valve is grossly normal. 7. There is mild dilatation of the ascending aorta measuring 40 mm. 8. Normal LV systolic function; moderate diastolic dysfunction; mild LVH; mildly dilated ascending aorta; s/p AVR with mean gradient of 18 mmHg and no AI; biatrial enlargement; mild MR; mild RVE.   EchoStudy Conclusions8/2019  - Left ventricle: The cavity size was normal. Systolic function was normal. The estimated ejection fraction was in the range of 60% to 65%.Wall motion was normal; there were no regional wall motion abnormalities. Features are consistent with a pseudonormal left ventricular filling pattern, with concomitant abnormal relaxation and increased filling pressure (grade 2  diastolic dysfunction). Doppler parameters are consistent with high ventricular filling pressure. - Aortic valve: A bioprosthesis was present and functioning normally. Mean gradient (S): 16 mm Hg. - Mitral valve: Calcified annulus. There was mild regurgitation. - Left atrium: The atrium was moderately dilated. - Right ventricle: The cavity size was mildly dilated. Wall thickness was normal. - Tricuspid valve: There was mild regurgitation. - Pulmonary arteries: Systolic pressure could not be accurately estimated.   MYOVIEW FINDINGS FROM 06/2013: Normal resting EKG. Slight ST depressions noted in aVL after administration of lexiscan. Mild shortness of breath and nausea resolved after the test. EKG is nondiagnostic for ischemia. TID ratio 1.05. Lung-heart ratio 0.43. Normal ventricular chamber size.  IMPRESSION: No evidence for ischemia. Normal wall motion. LVEF 72%.  Electronically Signed By: Guy Sandifer   Cardiac Cath: 02/23/2013 Left mainstem: Normal  Left anterior descending (LAD): Severe proximal and mid calcification with long proximal 95% stenosis. The mid and distal vessel is small but free of high grade disease.  Left  circumflex (LCx):AV groove has a mid 90% stenosis before a moderate sized MOM. OM1 and OM2 are occluded at the ostium and fill via the SVG. OM3 occluded at the ostium and fills via SVG. The grafted OMs are small and diffusely disease.  Right coronary artery (RCA): Occluded in the mid vessel. The PDA is moderate sized and occluded at the ostium. There is a 70% stenosis in the PDA after the insertion of the vein graft.  Grafts:  LIMA to LAD: Patent  SVG to RCA: Occluded (from original CABG)  SVG to RCA: Patent (from redo CABG). This is mild diffuse plaque within the vein graft.  SVG to OM3: Patent. This is mild diffuse plaque within the vein graft.  SVG sequential to OM1/OM2: Patent. There is ostial 50% stenosis. There is a patent proximal  SVG stent. The native marginals are very small.  Left ventriculography: LV not injected. AVR not crossed.  Final Conclusions: Severe native vessel CAD. Patent grafts as described with nonobstructive disease in the grafts. A stent placed into the sequential SVG is patent with some disease at the ostium that on several views in not occlusive. I did compare the 2009 cath with results today. There is high grade disease in the circumflex AV groove. However, this is not changed from 2009. However, this lesion does appear to lead into a non grafted OM. If she has further symptoms I would consider PCI of the native circumflex.     Assessment/Plan:  1.Fall/dizziness ? Orthostatic - with subsequent laceration to the back of her head - slow recovery. Slow recovery. Seeing ortho now for her shoulder.   2. Chronic diastolic HF - weight is down - on low dose Lasix - will try to keep her weight below 150#. She has had to use extra just once. She will continue with current plan.   3. Recurrent pleural effusion - better by most recent CXR - noted on the recent CT scan - still may need to consider CT - may need to consider Pleurex - she has a history of having more orthostasis with more diuresis - now with recurrent injury/laceration - she has had prior shoulder fracture in the past as well. Will keep on her current dose of Lasix - she knows to be in touch if she starts getting short of breath - she would need a CXR and escalation of diuretics.   4.Abnormal Abd [place] - CT of the abdomen previously noted.    5.Profound anemia - counts still dropping - I suspect she needs BMS - seeing hematology later this week. The plan for colonoscopywas placed on hold due to the fall and laceration - CT of the abdomen with no acute process noted.   6. CAD/aortic valve disease - remote CABG/AVR - managed medically - she has no active symptoms at this time - would continue with medical therapy.   7. CKD - stage IV  -she has had recent visit with Dr. Posey Pronto. This was stable.   8.History of orthostasis - reports negative adrenal work up - I see where she had an Sebastian from March 2019 - not sure if we need to re-consider this going forward.Still may need to consider. She is using Midodrine just BID - still with daily bouts of orthostasis - will increase to TID. She will continue to monitor.   9. PAF - in sinus on exam and by EKG today - on amiodarone .  10.Carotid disease-noted 1 to 39% bilateral stenosis from study 10/2017 -will  need repeat study 10/2019- not discussed today  11. Prior breast cancer - recent abnormal mammogram - s/p biopsies.Abnormality noted on the CT - she has had recent biopsies that were negative.   12. Recent fall/dizziness with injury -see above.  13 Health maintenance - switching primary care.   2. COVID-19 Education: The signs and symptoms of COVID-19 were discussed with the patient and how to seek care for testing (follow up with PCP or arrange E-visit).  The importance of social distancing, staying at home, hand hygiene and wearing a mask when out in public were discussed today.  Current medicines are reviewed with the patient today.  The patient does not have concerns regarding medicines other than what has been noted above.  The following changes have been made:  See above.  Labs/ tests ordered today include:    Orders Placed This Encounter  Procedures   Basic metabolic panel   CBC   Hepatic function panel   EKG 12-Lead     Disposition:   FU with me in about 3 weeks.    Patient is agreeable to this plan and will call if any problems develop in the interim.   SignedTruitt Merle, NP  03/31/2019 9:00 AM  [facility] [address] [place], [place]  [address] Phone: [phone] Fax: [phone]

## 2019-03-31 ENCOUNTER — Other Ambulatory Visit: Payer: Self-pay

## 2019-03-31 ENCOUNTER — Encounter: Payer: Self-pay | Admitting: Nurse Practitioner

## 2019-03-31 ENCOUNTER — Ambulatory Visit (INDEPENDENT_AMBULATORY_CARE_PROVIDER_SITE_OTHER): Payer: Medicare Other | Admitting: Nurse Practitioner

## 2019-03-31 VITALS — BP 122/60 | HR 57 | Ht 65.0 in | Wt 150.8 lb

## 2019-03-31 DIAGNOSIS — I428 Other cardiomyopathies: Secondary | ICD-10-CM | POA: Diagnosis not present

## 2019-03-31 DIAGNOSIS — N184 Chronic kidney disease, stage 4 (severe): Secondary | ICD-10-CM

## 2019-03-31 DIAGNOSIS — I5032 Chronic diastolic (congestive) heart failure: Secondary | ICD-10-CM

## 2019-03-31 DIAGNOSIS — R55 Syncope and collapse: Secondary | ICD-10-CM

## 2019-03-31 DIAGNOSIS — I2510 Atherosclerotic heart disease of native coronary artery without angina pectoris: Secondary | ICD-10-CM | POA: Diagnosis not present

## 2019-03-31 LAB — BASIC METABOLIC PANEL
BUN/Creatinine Ratio: 16 (ref 12–28)
BUN: 39 mg/dL — ABNORMAL HIGH (ref 8–27)
CO2: 22 mmol/L (ref 20–29)
Calcium: 8.8 mg/dL (ref 8.7–10.3)
Chloride: 102 mmol/L (ref 96–106)
Creatinine, Ser: 2.46 mg/dL — ABNORMAL HIGH (ref 0.57–1.00)
GFR calc Af Amer: 22 mL/min/{1.73_m2} — ABNORMAL LOW (ref >59)
GFR calc non Af Amer: 19 mL/min/{1.73_m2} — ABNORMAL LOW (ref >59)
Glucose: 182 mg/dL — ABNORMAL HIGH (ref 65–99)
Potassium: 4.8 mmol/L (ref 3.5–5.2)
Sodium: 139 mmol/L (ref 134–144)

## 2019-03-31 LAB — CBC
Hematocrit: 26.3 % — ABNORMAL LOW (ref 34.0–46.6)
Hemoglobin: 7.9 g/dL — ABNORMAL LOW (ref 11.1–15.9)
MCH: 27 pg (ref 26.6–33.0)
MCHC: 30 g/dL — ABNORMAL LOW (ref 31.5–35.7)
MCV: 90 fL (ref 79–97)
Platelets: 156 10*3/uL (ref 150–450)
RBC: 2.93 x10E6/uL — ABNORMAL LOW (ref 3.77–5.28)
RDW: 14.3 % (ref 11.7–15.4)
WBC: 5.8 10*3/uL (ref 3.4–10.8)

## 2019-03-31 LAB — HEPATIC FUNCTION PANEL
ALT: 23 IU/L (ref 0–32)
AST: 36 IU/L (ref 0–40)
Albumin: 3 g/dL — ABNORMAL LOW (ref 3.7–4.7)
Alkaline Phosphatase: 81 IU/L (ref 39–117)
Bilirubin Total: 0.6 mg/dL (ref 0.0–1.2)
Bilirubin, Direct: 0.23 mg/dL (ref 0.00–0.40)
Total Protein: 6.2 g/dL (ref 6.0–8.5)

## 2019-03-31 MED ORDER — MIDODRINE HCL 2.5 MG PO TABS: 2.5000 mg | ORAL_TABLET | Freq: Three times a day (TID) | ORAL | 3 refills | Status: DC

## 2019-03-31 NOTE — Patient Instructions (Addendum)
After Visit Summary:  We will be checking the following labs today - BMET, CBC and HPF   Medication Instructions:    Continue with your current medicines. BUT  I want you to take the Midodrine 3 times a day   If you need a refill on your cardiac medications before your next appointment, please call your pharmacy.     Testing/Procedures To Be Arranged:  N/A  Follow-Up:   See me in about 3 weeks    At [facility], you and your health needs are our priority.  As part of our continuing mission to provide you with exceptional heart care, we have created designated Provider Care Teams.  These Care Teams include your primary Cardiologist (physician) and Advanced Practice Providers (APPs -  Physician Assistants and Nurse Practitioners) who all work together to provide you with the care you need, when you need it.  Special Instructions:  . Stay safe, stay home, wash your hands for at least 20 seconds and wear a mask when out in public.  . It was good to talk with you today.    Call the [place] office at [phone] if you have any questions, problems or concerns.

## 2019-04-01 NOTE — Progress Notes (Signed)
Patient Care Team: Saguier, Iris Pert as PCP - General (Internal Medicine) [name], NP as PCP - Cardiology (Nurse Practitioner) Evans Lance, MD as PCP - Electrophysiology (Cardiology) Alphonsa Overall, MD as Consulting Physician (General Surgery) Nicholas Lose, MD as Consulting Physician (Hematology and Oncology) Gery Pray, MD as Consulting Physician (Radiation Oncology) Evans Lance, MD as Consulting Physician (Cardiology) Collene Gobble, MD as Consulting Physician (Pulmonary Disease) Elayne Snare, MD as Consulting Physician (Endocrinology) Delice Bison, Charlestine Massed, NP as Nurse Practitioner (Hematology and Oncology) Ardelle Balls., MD (Neurology) Izora Gala, MD as Consulting Physician (Otolaryngology) Trula Slade, DPM as Consulting Physician (Podiatry)  DIAGNOSIS:    ICD-10-CM   1. Malignant neoplasm of upper-inner quadrant of right breast in female, estrogen receptor positive ([place])  C50.211    Z17.0     SUMMARY OF ONCOLOGIC HISTORY: Oncology History  Breast cancer of upper-inner quadrant of right female breast ([place])  12/14/2015 Initial Diagnosis   Right breast biopsy 12:30 position: 2 masses, 1.6 cm mass: Invasive ductal carcinoma, grade 2, ER 0%, PR 0%, HER-2 negative, Ki-67 70%; satellite mass 8 mm: IDC grade 2 ER 5%, PR 5%, HER-2 negative, Ki-67 50%; T1cN0 stage IA clinical stage   01/18/2016 Surgery   Right lumpectomy: Multifocal IDC grade 3, 1.9 cm  ER 0%, PR 0%, HER-2 negative, Ki-67 70% and 0.8 cm satellite mass ER 5%, PR 2%, HER-2 negative, Ki-67 50%, high-grade DCIS, margins negative, 0/1 lymph nodes negative T1 cN0 stage IA   02/17/2016 - 04/06/2016 Chemotherapy   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 with above-mentioned history of right breast cancer treated with lumpectomy,adjuvant chemotherapy,radiation, and whois currently on antiestrogen therapywith exemestane.Shealsohas a history of anemia, andpreviouslyreceived PRBCs. She is currently receiving Retacritinjections. Labs on 03/31/19 showed: Hg 7.9, HCT 26.3, MCV 90, creatinine 2.46. Shepresents to the clinic today for Retacrit and to review her recent labs.   REVIEW OF SYSTEMS:   Constitutional: Denies fevers, chills or abnormal weight loss, wheelchair-bound, severe fatigue Eyes: Denies blurriness of vision Ears, nose, mouth, throat, and face: Denies mucositis or sore throat Respiratory: Denies cough, dyspnea or wheezes Cardiovascular: Denies palpitation, chest discomfort Gastrointestinal: Denies nausea, heartburn or change in bowel habits Skin: Denies abnormal skin rashes Lymphatics: Denies new lymphadenopathy or easy bruising Neurological: Denies numbness, tingling or new weaknesses Behavioral/Psych: Mood is stable, no new changes  Extremities:   lower extremity edema Breast: denies any pain or lumps or nodules in either breasts All other systems were reviewed with the patient and are negative.  I have reviewed the past medical history, past surgical history, social history and family history with the patient and they are unchanged from previous note.  ALLERGIES:  is allergic to amoxicillin; tape; aldactone [spironolactone]; imdur [isosorbide dinitrate]; arimidex [anastrozole]; latex; and tetracycline.  MEDICATIONS:  Current Outpatient Medications  Medication Sig Dispense  Refill  . ACCU-CHEK GUIDE test strip Use to test blood  sugar 3 times daily 100 each 3  . acetaminophen (TYLENOL) 325 MG tablet Take 2 tablets (650 mg total) by mouth every 6 (six) hours as needed for mild pain (or Fever >/= 101).    Marland Kitchen albuterol (PROVENTIL HFA;VENTOLIN HFA) 108 (90 Base) MCG/ACT inhaler Inhale 2 puffs into the lungs every 6 (six) hours as needed for wheezing or shortness of breath.    Marland Kitchen amiodarone (PACERONE) 200 MG tablet Take 1 tablet (200 mg total) by mouth daily. 90 tablet 3  . apixaban (ELIQUIS) 5 MG TABS tablet Take 1 tablet (5 mg total) by mouth 2 (two) times daily. 180 tablet 2  . Blood Glucose Monitoring Suppl (ACCU-CHEK AVIVA PLUS) w/Device KIT Use accu chek aviva meter to check blood sugar three times daily. 1 kit 0  . budesonide-formoterol (SYMBICORT) 160-4.5 MCG/ACT inhaler INHALE TWO PUFFS INTO THE LUNGS TWICE DAILY 3 Inhaler 1  . epoetin alfa-epbx (RETACRIT) 08657 UNIT/ML injection Inject 40,000 Units into the skin every Friday.    Marland Kitchen exemestane (AROMASIN) 25 MG tablet TAKE ONE TABLET BY MOUTH DAILY AFTER BREAKFAST 90 tablet 3  . ezetimibe (ZETIA) 10 MG tablet TAKE ONE TABLET BY MOUTH EVERY DAY 90 tablet 3  . famotidine (PEPCID) 20 MG tablet One after supper 30 tablet 11  . furosemide (LASIX) 20 MG tablet Take 20 mg every other day. 30 tablet 2  . insulin glargine (LANTUS) 100 UNIT/ML injection Inject 16 units under the skin once daily in the morning. 20 mL 3  . insulin lispro (HUMALOG) 100 UNIT/ML injection Inject 4-6 units under the skin three times daily before meals. 20 mL 3  . Insulin Pen Needle 32G X 4 MM MISC Use as instructed to inject insulin 4 times daily. 400 each 3  . loratadine (CLARITIN) 10 MG tablet Take 10 mg by mouth daily as needed for allergies.     . metoprolol tartrate (LOPRESSOR) 25 MG tablet Take 1 tablet (25 mg total) by mouth 2 (two) times daily. 180 tablet 3  . midodrine (PROAMATINE) 2.5 MG tablet Take 1 tablet (2.5 mg total) by mouth 3 (three)  times daily with meals. 60 tablet 3  . nitroGLYCERIN (NITROSTAT) 0.4 MG SL tablet Place 1 tablet (0.4 mg total) under the tongue every 5 (five) minutes as needed for chest pain. 30 tablet 3  . potassium chloride SA (K-DUR) 20 MEQ tablet Take 1 tablet (20 mEq total) by mouth daily. 90 tablet 3  . rosuvastatin (CRESTOR) 40 MG tablet Take 1 tablet (40 mg total) by mouth at bedtime. 90 tablet 3  . zolpidem (AMBIEN) 5 MG tablet TAKE ONE TABLET BY MOUTH AT BEDTIME AS NEEDED FOR SLEEP 14 tablet 0   No current facility-administered medications for this visit.     PHYSICAL EXAMINATION: ECOG PERFORMANCE STATUS: 1 - Symptomatic but completely ambulatory  Vitals:   04/02/19 1427  BP: (!) 108/40  Pulse: 79  Resp: 17  Temp: 98.3 F (36.8 C)  SpO2: 100%   Filed Weights   04/02/19 1427  Weight: 154 lb 12.8 oz (70.2 kg)    GENERAL:, Wheelchair-bound SKIN: skin color, texture, turgor are normal, no rashes or significant lesions EYES: normal, Conjunctiva are pink and non-injected, sclera clear OROPHARYNX: no exudate, no erythema and lips, buccal mucosa, and tongue normal  NECK: supple, thyroid normal size, non-tender, without nodularity LYMPH: no palpable lymphadenopathy in the cervical, axillary or inguinal LUNGS: clear to auscultation and percussion with normal breathing effort HEART: regular rate & rhythm and no  murmurs and no lower extremity edema ABDOMEN: abdomen soft, non-tender and normal bowel sounds MUSCULOSKELETAL: no cyanosis of digits and no clubbing  NEURO: alert & oriented x 3 with fluent speech, no focal motor/sensory deficits EXTREMITIES: Positive for lower extremity edema  LABORATORY DATA:  I have reviewed the data as listed CMP Latest Ref Rng & Units 03/31/2019 03/04/2019 02/23/2019  Glucose 65 - 99 mg/dL 182(H) 155(H) 119(H)  BUN 8 - 27 mg/dL 39(H) 35(H) 33(H)  Creatinine 0.57 - 1.00 mg/dL 2.46(H) 2.32(H) 2.40(H)  Sodium 134 - 144 mmol/L 139 136 142  Potassium 3.5 - 5.2  mmol/L 4.8 4.4 4.0  Chloride 96 - 106 mmol/L 102 100 105  CO2 20 - 29 mmol/L _0 Calcium 8.7 - 10.3 mg/dL 8.8 8.8(L) 8.4(L)  Total Protein 6.0 - 8.5 g/dL 6.2 6.1(L) -  Total Bilirubin 0.0 - 1.2 mg/dL 0.6 0.8 -  Alkaline Phos 39 - 117 IU/L 81 65 -  AST 0 - 40 IU/L 36 49(H) -  ALT 0 - 32 IU/L 23 27 -    Lab Results  Component Value Date   WBC 5.9 04/02/2019   HGB 7.5 (L) 04/02/2019   HCT 26.5 (L) 04/02/2019   MCV 94.3 04/02/2019   PLT 144 (L) 04/02/2019   NEUTROABS 4.1 04/02/2019    ASSESSMENT & PLAN:  Breast cancer of upper-inner quadrant of right female breast ([place]) Patient has had longstanding anemia where the hemoglobin was between 10 to 12 g. Since April 2020 her hemoglobin has gotten worse and today it is at 6.6. During the same timeframe her creatinine got from 4.4-8.18 H63 folic acid and iron studies were normal. 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  Current treatment:Retacritinjectionsweeklystarted7/31/2020 Lab review:Hemoglobin is 7.5 I discussed with her that we need to do a bone marrow biopsy to evaluate the cause of profound anemia.  If there is a secondary etiology for the anemia we need to find that so we can treat her appropriately.  Current treatment with Retacrit injections has not made a huge difference in her CBC. We discussed the role of blood transfusion and patient does not feel any worse than before and hence we will avoid the transfusion at this time.  Hospitalization for CHF and CKD: Treated with diuresis and improved. Ultrasound of the abdomen shows evidence of prior gallbladder surgery and a cyst mild ascites and right pleural effusion.  Breast cancer: Doing well with exemestane.   We will perform the bone marrow biopsy on 04/08/2019 I will see her 1 week from that to discuss the results.  In the interim she will continue weekly Retacrit injections.    No orders of  the defined types were placed in this encounter.  The patient has a good understanding of the overall plan. she agrees with it. she will call with any problems that may develop before the next visit here.  Nicholas Lose, MD 04/02/2019  Julious Oka Dorshimer am acting as scribe for Dr. Nicholas Lose.  I have reviewed the above documentation for accuracy and completeness, and I agree with the above.

## 2019-04-02 ENCOUNTER — Other Ambulatory Visit: Payer: Self-pay

## 2019-04-02 ENCOUNTER — Inpatient Hospital Stay (HOSPITAL_BASED_OUTPATIENT_CLINIC_OR_DEPARTMENT_OTHER): Payer: Medicare Other | Admitting: Hematology and Oncology

## 2019-04-02 ENCOUNTER — Inpatient Hospital Stay: Payer: Medicare Other

## 2019-04-02 ENCOUNTER — Ambulatory Visit
Admission: RE | Admit: 2019-04-02 | Discharge: 2019-04-02 | Disposition: A | Discharge status: Home / Self Care | Payer: Medicare Other | Source: Ambulatory Visit | Attending: Orthopaedic Surgery | Admitting: Orthopaedic Surgery

## 2019-04-02 VITALS — BP 126/72 | HR 52 | Temp 98.0°F | Resp 16

## 2019-04-02 DIAGNOSIS — C50211 Malignant neoplasm of upper-inner quadrant of right female breast: Secondary | ICD-10-CM

## 2019-04-02 DIAGNOSIS — I129 Hypertensive chronic kidney disease with stage 1 through stage 4 chronic kidney disease, or unspecified chronic kidney disease: Secondary | ICD-10-CM | POA: Diagnosis not present

## 2019-04-02 DIAGNOSIS — D638 Anemia in other chronic diseases classified elsewhere: Secondary | ICD-10-CM

## 2019-04-02 DIAGNOSIS — Z170 Estrogen receptor positive status [ER+]: Secondary | ICD-10-CM | POA: Diagnosis not present

## 2019-04-02 DIAGNOSIS — G8929 Other chronic pain: Secondary | ICD-10-CM

## 2019-04-02 DIAGNOSIS — D509 Iron deficiency anemia, unspecified: Secondary | ICD-10-CM

## 2019-04-02 DIAGNOSIS — M25512 Pain in left shoulder: Secondary | ICD-10-CM

## 2019-04-02 LAB — SAMPLE TO BLOOD BANK

## 2019-04-02 LAB — CBC WITH DIFFERENTIAL (CANCER CENTER ONLY)
Abs Immature Granulocytes: 0.01 10*3/uL (ref 0.00–0.07)
Basophils Absolute: 0.1 10*3/uL (ref 0.0–0.1)
Basophils Relative: 1 %
Eosinophils Absolute: 0.2 10*3/uL (ref 0.0–0.5)
Eosinophils Relative: 4 %
HCT: 26.5 % — ABNORMAL LOW (ref 36.0–46.0)
Hemoglobin: 7.5 g/dL — ABNORMAL LOW (ref 12.0–15.0)
Immature Granulocytes: 0 %
Lymphocytes Relative: 13 %
Lymphs Abs: 0.7 10*3/uL (ref 0.7–4.0)
MCH: 26.7 pg (ref 26.0–34.0)
MCHC: 28.3 g/dL — ABNORMAL LOW (ref 30.0–36.0)
MCV: 94.3 fL (ref 80.0–100.0)
Monocytes Absolute: 0.7 10*3/uL (ref 0.1–1.0)
Monocytes Relative: 12 %
Neutro Abs: 4.1 10*3/uL (ref 1.7–7.7)
Neutrophils Relative %: 70 %
Platelet Count: 144 10*3/uL — ABNORMAL LOW (ref 150–400)
RBC: 2.81 MIL/uL — ABNORMAL LOW (ref 3.87–5.11)
RDW: 15.2 % (ref 11.5–15.5)
WBC Count: 5.9 10*3/uL (ref 4.0–10.5)
nRBC: 0 % (ref 0.0–0.2)

## 2019-04-02 MED ORDER — EPOETIN ALFA-EPBX 40000 UNIT/ML IJ SOLN
40000.0000 [IU] | Freq: Once | INTRAMUSCULAR | Status: AC
  Administered 2019-04-02: 40000 [IU] via SUBCUTANEOUS
  Filled 2019-04-02: qty 1

## 2019-04-02 NOTE — Progress Notes (Signed)
Pt scheduled for bone marrow bx per MD request.  RN Notified flo cytometry.  Lab orders placed.  Pt aware of appointment time and dates.

## 2019-04-02 NOTE — Assessment & Plan Note (Signed)
Patient has had longstanding anemia where the hemoglobin was between 10 to 12 g. Since April 2020 her hemoglobin has gotten worse and today it is at 6.6. During the same timeframe her creatinine got from 4.8-8.45 B33 folic acid and iron studies were normal. 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  Current treatment:Retacritinjectionsweeklystarted7/31/2020 Lab review:Hemoglobin is 8.7 stable  Hospitalization for CHF and CKD: Treated with diuresis and improved. Ultrasound of the abdomen shows evidence of prior gallbladder surgery and a cyst mild ascites and right pleural effusion.  Breast cancer: Doing well with exemestane.  Continue to closely monitor her blood counts.  She will come weekly for injections and I will see her back in 4 weeks.

## 2019-04-08 ENCOUNTER — Other Ambulatory Visit: Payer: Self-pay

## 2019-04-08 ENCOUNTER — Inpatient Hospital Stay (HOSPITAL_BASED_OUTPATIENT_CLINIC_OR_DEPARTMENT_OTHER): Payer: Medicare Other | Admitting: Hematology and Oncology

## 2019-04-08 ENCOUNTER — Telehealth: Payer: Self-pay

## 2019-04-08 ENCOUNTER — Inpatient Hospital Stay: Payer: Medicare Other

## 2019-04-08 VITALS — BP 134/49 | HR 57 | Temp 98.5°F | Resp 16

## 2019-04-08 DIAGNOSIS — D649 Anemia, unspecified: Secondary | ICD-10-CM

## 2019-04-08 DIAGNOSIS — Z170 Estrogen receptor positive status [ER+]: Secondary | ICD-10-CM

## 2019-04-08 DIAGNOSIS — D638 Anemia in other chronic diseases classified elsewhere: Secondary | ICD-10-CM

## 2019-04-08 DIAGNOSIS — I129 Hypertensive chronic kidney disease with stage 1 through stage 4 chronic kidney disease, or unspecified chronic kidney disease: Secondary | ICD-10-CM | POA: Diagnosis not present

## 2019-04-08 DIAGNOSIS — D509 Iron deficiency anemia, unspecified: Secondary | ICD-10-CM

## 2019-04-08 DIAGNOSIS — C50211 Malignant neoplasm of upper-inner quadrant of right female breast: Secondary | ICD-10-CM

## 2019-04-08 LAB — CBC WITH DIFFERENTIAL (CANCER CENTER ONLY)
Abs Immature Granulocytes: 0.02 10*3/uL (ref 0.00–0.07)
Basophils Absolute: 0 10*3/uL (ref 0.0–0.1)
Basophils Relative: 1 %
Eosinophils Absolute: 0.2 10*3/uL (ref 0.0–0.5)
Eosinophils Relative: 4 %
HCT: 23.5 % — ABNORMAL LOW (ref 36.0–46.0)
Hemoglobin: 6.7 g/dL — CL (ref 12.0–15.0)
Immature Granulocytes: 0 %
Lymphocytes Relative: 11 %
Lymphs Abs: 0.6 10*3/uL — ABNORMAL LOW (ref 0.7–4.0)
MCH: 27.1 pg (ref 26.0–34.0)
MCHC: 28.5 g/dL — ABNORMAL LOW (ref 30.0–36.0)
MCV: 95.1 fL (ref 80.0–100.0)
Monocytes Absolute: 0.9 10*3/uL (ref 0.1–1.0)
Monocytes Relative: 15 %
Neutro Abs: 4 10*3/uL (ref 1.7–7.7)
Neutrophils Relative %: 69 %
Platelet Count: 116 10*3/uL — ABNORMAL LOW (ref 150–400)
RBC: 2.47 MIL/uL — ABNORMAL LOW (ref 3.87–5.11)
RDW: 15.7 % — ABNORMAL HIGH (ref 11.5–15.5)
WBC Count: 5.7 10*3/uL (ref 4.0–10.5)
nRBC: 0 % (ref 0.0–0.2)

## 2019-04-08 LAB — IRON AND TIBC
Iron: 25 ug/dL — ABNORMAL LOW (ref 41–142)
Saturation Ratios: 9 % — ABNORMAL LOW (ref 21–57)
TIBC: 278 ug/dL (ref 236–444)
UIBC: 253 ug/dL (ref 120–384)

## 2019-04-08 LAB — FERRITIN: Ferritin: 22 ng/mL (ref 11–307)

## 2019-04-08 LAB — SAMPLE TO BLOOD BANK

## 2019-04-08 LAB — PREPARE RBC (CROSSMATCH)

## 2019-04-08 LAB — RETICULOCYTES
Immature Retic Fract: 21.8 % — ABNORMAL HIGH (ref 2.3–15.9)
RBC.: 2.48 MIL/uL — ABNORMAL LOW (ref 3.87–5.11)
Retic Count, Absolute: 77.9 10*3/uL (ref 19.0–186.0)
Retic Ct Pct: 3.1 % (ref 0.4–3.1)

## 2019-04-08 MED ORDER — EPOETIN ALFA-EPBX 40000 UNIT/ML IJ SOLN
40000.0000 [IU] | Freq: Once | INTRAMUSCULAR | Status: AC
  Administered 2019-04-08: 09:00:00 40000 [IU] via SUBCUTANEOUS

## 2019-04-08 MED ORDER — EPOETIN ALFA-EPBX 40000 UNIT/ML IJ SOLN
INTRAMUSCULAR | Status: AC
  Filled 2019-04-08: qty 1

## 2019-04-08 MED ORDER — LIDOCAINE HCL 2 % IJ SOLN
INTRAMUSCULAR | Status: AC
  Filled 2019-04-08: qty 20

## 2019-04-08 NOTE — Telephone Encounter (Signed)
MD notified of critical Hgb of 6.7.  Verbal orders given for 2 units of blood this week.    Pt scheduled for 2 units on 10/30 @ 11:30.  RN instructed patient to keep blood bracelet on.  Verbalized understanding.  Orders placed, blood bank notified.

## 2019-04-08 NOTE — Progress Notes (Signed)
INDICATION: Severe anemia not responding to erythropoietin replacement therapy   Bone Marrow Biopsy and Aspiration Procedure Note   Informed consent was obtained and potential risks including bleeding, infection and pain were reviewed with the patient.  The patient's name, date of birth, identification, consent and allergies were verified prior to the start of procedure and time out was performed.  The left posterior iliac crest was chosen as the site of biopsy.  The skin was prepped with ChloraPrep.   8 cc of 1% lidocaine was used to provide local anaesthesia.   10 cc of bone marrow aspirate was obtained followed by 1cm biopsy.  Pressure was applied to the biopsy site and bandage was placed over the biopsy site. Patient was made to lie on the back for 15 mins prior to discharge.  The procedure was tolerated well. COMPLICATIONS: None BLOOD LOSS: none The patient was discharged home in stable condition with a 1 week follow up to review results.  Patient was provided with post bone marrow biopsy instructions and instructed to call if there was any bleeding or worsening pain.  Specimens sent for flow cytometry, cytogenetics and additional studies.  Signed

## 2019-04-08 NOTE — Progress Notes (Signed)
Per Dr Lindi Adie Pt to receive Retacrit today w/o  CBC resulting.   Pt BMBX site clean dry and intact at D/C. VSS at D/C. Pt verbalized understanding to discharge instructions.

## 2019-04-08 NOTE — Patient Instructions (Signed)
Bone Marrow Aspiration and Bone Marrow Biopsy, Adult, Care After This sheet gives you information about how to care for yourself after your procedure. Your health care provider may also give you more specific instructions. If you have problems or questions, contact your health care provider. What can I expect after the procedure? After the procedure, it is common to have:  Mild pain and tenderness.  Swelling.  Bruising. Follow these instructions at home: Puncture site care      Follow instructions from your health care provider about how to take care of the puncture site. Make sure you: ? Wash your hands with soap and water before you change your bandage (dressing). If soap and water are not available, use hand sanitizer. ? Change your dressing as told by your health care provider.  Check your puncture siteevery day for signs of infection. Check for: ? More redness, swelling, or pain. ? More fluid or blood. ? Warmth. ? Pus or a bad smell. General instructions  Take over-the-counter and prescription medicines only as told by your health care provider.  Do not take baths, swim, or use a hot tub until your health care provider approves. Ask if you can take a shower or have a sponge bath.  Return to your normal activities as told by your health care provider. Ask your health care provider what activities are safe for you.  Do not drive for 24 hours if you were given a medicine to help you relax (sedative) during your procedure.  Keep all follow-up visits as told by your health care provider. This is important. Contact a health care provider if:  Your pain is not controlled with medicine. Get help right away if:  You have a fever.  You have more redness, swelling, or pain around the puncture site.  You have more fluid or blood coming from the puncture site.  Your puncture site feels warm to the touch.  You have pus or a bad smell coming from the puncture site. These  symptoms may represent a serious problem that is an emergency. Do not wait to see if the symptoms will go away. Get medical help right away. Call your local emergency services (911 in the [place] Do not drive yourself to the hospital. Summary  After the procedure, it is common to have mild pain, tenderness, swelling, and bruising.  Follow instructions from your health care provider about how to take care of the puncture site.  Get help right away if you have any symptoms of infection or if you have more blood or fluid coming from the puncture site. This information is not intended to replace advice given to you by your health care provider. Make sure you discuss any questions you have with your health care provider. Document Released: 12/15/2004 Document Revised: 09/10/2017 Document Reviewed: 11/09/2015 Elsevier Patient Education  2020 Elsevier Inc.  

## 2019-04-09 ENCOUNTER — Telehealth: Payer: Self-pay | Admitting: Orthopaedic Surgery

## 2019-04-09 ENCOUNTER — Telehealth: Payer: Self-pay | Admitting: Hematology and Oncology

## 2019-04-09 LAB — SURGICAL PATHOLOGY

## 2019-04-09 NOTE — Telephone Encounter (Signed)
I talk with patient regarding schedule  

## 2019-04-09 NOTE — Telephone Encounter (Signed)
Patient called asked if she can be called with the CT scan results. The number to contact patient is [phone]

## 2019-04-09 NOTE — Telephone Encounter (Signed)
called

## 2019-04-09 NOTE — Telephone Encounter (Signed)
Please see below.

## 2019-04-10 ENCOUNTER — Other Ambulatory Visit: Payer: Self-pay

## 2019-04-10 ENCOUNTER — Inpatient Hospital Stay: Payer: Medicare Other

## 2019-04-10 ENCOUNTER — Telehealth: Payer: Self-pay

## 2019-04-10 ENCOUNTER — Other Ambulatory Visit: Payer: Self-pay | Admitting: Endocrinology

## 2019-04-10 DIAGNOSIS — D649 Anemia, unspecified: Secondary | ICD-10-CM

## 2019-04-10 DIAGNOSIS — D509 Iron deficiency anemia, unspecified: Secondary | ICD-10-CM

## 2019-04-10 DIAGNOSIS — D638 Anemia in other chronic diseases classified elsewhere: Secondary | ICD-10-CM

## 2019-04-10 DIAGNOSIS — I129 Hypertensive chronic kidney disease with stage 1 through stage 4 chronic kidney disease, or unspecified chronic kidney disease: Secondary | ICD-10-CM | POA: Diagnosis not present

## 2019-04-10 LAB — CBC WITH DIFFERENTIAL (CANCER CENTER ONLY)
Abs Immature Granulocytes: 0.02 10*3/uL (ref 0.00–0.07)
Basophils Absolute: 0.1 10*3/uL (ref 0.0–0.1)
Basophils Relative: 1 %
Eosinophils Absolute: 0.1 10*3/uL (ref 0.0–0.5)
Eosinophils Relative: 2 %
HCT: 23.1 % — ABNORMAL LOW (ref 36.0–46.0)
Hemoglobin: 6.7 g/dL — CL (ref 12.0–15.0)
Immature Granulocytes: 0 %
Lymphocytes Relative: 10 %
Lymphs Abs: 0.5 10*3/uL — ABNORMAL LOW (ref 0.7–4.0)
MCH: 27.2 pg (ref 26.0–34.0)
MCHC: 29 g/dL — ABNORMAL LOW (ref 30.0–36.0)
MCV: 93.9 fL (ref 80.0–100.0)
Monocytes Absolute: 0.6 10*3/uL (ref 0.1–1.0)
Monocytes Relative: 10 %
Neutro Abs: 4.3 10*3/uL (ref 1.7–7.7)
Neutrophils Relative %: 77 %
Platelet Count: 121 10*3/uL — ABNORMAL LOW (ref 150–400)
RBC: 2.46 MIL/uL — ABNORMAL LOW (ref 3.87–5.11)
RDW: 15.9 % — ABNORMAL HIGH (ref 11.5–15.5)
WBC Count: 5.6 10*3/uL (ref 4.0–10.5)
nRBC: 0 % (ref 0.0–0.2)

## 2019-04-10 LAB — BPAM RBC
Blood Product Expiration Date: 202011302359
Blood Product Expiration Date: 202011302359
Unit Type and Rh: 5100
Unit Type and Rh: 5100

## 2019-04-10 LAB — TYPE AND SCREEN
ABO/RH(D): O POS
Antibody Screen: NEGATIVE
Unit division: 0
Unit division: 0

## 2019-04-10 LAB — PREPARE RBC (CROSSMATCH)

## 2019-04-10 MED ORDER — DIPHENHYDRAMINE HCL 25 MG PO CAPS
ORAL_CAPSULE | ORAL | Status: AC
  Filled 2019-04-10: qty 1

## 2019-04-10 MED ORDER — ACETAMINOPHEN 325 MG PO TABS
ORAL_TABLET | ORAL | Status: AC
  Filled 2019-04-10: qty 2

## 2019-04-10 MED ORDER — SODIUM CHLORIDE 0.9% IV SOLUTION
250.0000 mL | Freq: Once | INTRAVENOUS | Status: AC
  Administered 2019-04-10: 250 mL via INTRAVENOUS
  Filled 2019-04-10: qty 250

## 2019-04-10 MED ORDER — DIPHENHYDRAMINE HCL 25 MG PO CAPS
25.0000 mg | ORAL_CAPSULE | Freq: Once | ORAL | Status: AC
  Administered 2019-04-10: 25 mg via ORAL

## 2019-04-10 MED ORDER — ACETAMINOPHEN 325 MG PO TABS
650.0000 mg | ORAL_TABLET | Freq: Once | ORAL | Status: AC
  Administered 2019-04-10: 650 mg via ORAL

## 2019-04-10 MED ORDER — ACCU-CHEK GUIDE VI STRP: ORAL_STRIP | 3 refills | Status: DC

## 2019-04-10 NOTE — Patient Instructions (Signed)
Blood Transfusion, Adult, Care After This sheet gives you information about how to care for yourself after your procedure. Your doctor may also give you more specific instructions. If you have problems or questions, contact your doctor. Follow these instructions at home:   Take over-the-counter and prescription medicines only as told by your doctor.  Go back to your normal activities as told by your doctor.  Follow instructions from your doctor about how to take care of the area where an IV tube was put into your vein (insertion site). Make sure you: ? Wash your hands with soap and water before you change your bandage (dressing). If there is no soap and water, use hand sanitizer. ? Change your bandage as told by your doctor.  Check your IV insertion site every day for signs of infection. Check for: ? More redness, swelling, or pain. ? More fluid or blood. ? Warmth. ? Pus or a bad smell. Contact a doctor if:  You have more redness, swelling, or pain around the IV insertion site.  You have more fluid or blood coming from the IV insertion site.  Your IV insertion site feels warm to the touch.  You have pus or a bad smell coming from the IV insertion site.  Your pee (urine) turns pink, red, or brown.  You feel weak after doing your normal activities. Get help right away if:  You have signs of a serious allergic or body defense (immune) system reaction, including: ? Itchiness. ? Hives. ? Trouble breathing. ? Anxiety. ? Pain in your chest or lower back. ? Fever, flushing, and chills. ? Fast pulse. ? Rash. ? Watery poop (diarrhea). ? Throwing up (vomiting). ? Dark pee. ? Serious headache. ? Dizziness. ? Stiff neck. ? Yellow color in your face or the white parts of your eyes (jaundice). Summary  After a blood transfusion, return to your normal activities as told by your doctor.  Every day, check for signs of infection where the IV tube was put into your vein.  Some  signs of infection are warm skin, more redness and pain, more fluid or blood, and pus or a bad smell where the needle went in.  Contact your doctor if you feel weak or have any unusual symptoms. This information is not intended to replace advice given to you by your health care provider. Make sure you discuss any questions you have with your health care provider. Document Released: 06/18/2014 Document Revised: 10/02/2017 Document Reviewed: 01/20/2016 Elsevier Patient Education  2020 Elsevier Inc.  

## 2019-04-10 NOTE — Telephone Encounter (Signed)
MEDICATION: ACCU-CHEK GUIDE test strip  PHARMACY:  [place], [place] A 90 DAY SUPPLY : yes  IS PATIENT OUT OF MEDICATION:   IF NOT; HOW MUCH IS LEFT:   LAST APPOINTMENT DATE: @10 /30/2020  NEXT APPOINTMENT DATE:@12 /09/2018  DO WE HAVE YOUR PERMISSION TO LEAVE A DETAILED MESSAGE:  OTHER COMMENTS:    **Let patient know to contact pharmacy at the end of the day to make sure medication is ready. **  ** Please notify patient to allow 48-72 hours to process**  **Encourage patient to contact the pharmacy for refills or they can request refills through [facility]**

## 2019-04-10 NOTE — Telephone Encounter (Signed)
Rx sent 

## 2019-04-12 LAB — BPAM RBC
Blood Product Expiration Date: 202011302359
Blood Product Expiration Date: 202011302359
ISSUE DATE / TIME: 202010301356
ISSUE DATE / TIME: 202010301356
Unit Type and Rh: 5100
Unit Type and Rh: 5100

## 2019-04-12 LAB — TYPE AND SCREEN
ABO/RH(D): O POS
Antibody Screen: NEGATIVE
Unit division: 0
Unit division: 0

## 2019-04-15 ENCOUNTER — Encounter (HOSPITAL_COMMUNITY): Payer: Self-pay | Admitting: Hematology and Oncology

## 2019-04-15 NOTE — Progress Notes (Signed)
Patient Care Team: Saguier, Iris Pert as PCP - General (Internal Medicine) [name], NP as PCP - Cardiology (Nurse Practitioner) Evans Lance, MD as PCP - Electrophysiology (Cardiology) Alphonsa Overall, MD as Consulting Physician (General Surgery) Nicholas Lose, MD as Consulting Physician (Hematology and Oncology) Gery Pray, MD as Consulting Physician (Radiation Oncology) Evans Lance, MD as Consulting Physician (Cardiology) Collene Gobble, MD as Consulting Physician (Pulmonary Disease) Elayne Snare, MD as Consulting Physician (Endocrinology) Delice Bison, Charlestine Massed, NP as Nurse Practitioner (Hematology and Oncology) Ardelle Balls., MD (Neurology) Izora Gala, MD as Consulting Physician (Otolaryngology) Trula Slade, DPM as Consulting Physician (Podiatry)  DIAGNOSIS:    ICD-10-CM   1. Anemia of chronic disease  D63.8 CBC with Differential (Cancer Center Only)    Sample to Blood Bank    Vitamin B12  2. Malignant neoplasm of upper-inner quadrant of right breast in female, estrogen receptor positive ([place])  C50.211    Z17.0   3. Iron deficiency anemia, unspecified iron deficiency anemia type  D50.9 CBC with Differential (Cancer Center Only)    Sample to Blood Bank    Vitamin B12    SUMMARY OF ONCOLOGIC HISTORY: Oncology History  Breast cancer of upper-inner quadrant of right female breast ([place])  12/14/2015 Initial Diagnosis   Right breast biopsy 12:30 position: 2 masses, 1.6 cm mass: Invasive ductal carcinoma, grade 2, ER 0%, PR 0%, HER-2 negative, Ki-67 70%; satellite mass 8 mm: IDC grade 2 ER 5%, PR 5%, HER-2 negative, Ki-67 50%; T1cN0 stage IA clinical stage   01/18/2016 Surgery   Right lumpectomy: Multifocal IDC grade 3, 1.9 cm  ER 0%, PR 0%, HER-2 negative, Ki-67 70% and 0.8 cm satellite mass ER 5%, PR 2%, HER-2 negative, Ki-67 50%, high-grade DCIS, margins negative, 0/1 lymph nodes negative T1 cN0 stage IA   02/17/2016 - 04/06/2016 Chemotherapy   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 with above-mentioned history of right breast cancer treated with lumpectomy,adjuvant chemotherapy,radiation, and whois currently on antiestrogen therapywith exemestane.Shealsohas a history of anemia, andpreviouslyreceived PRBCs. She is currently receiving Retacritinjections. She underwent a bone marrow biopsy on 04/08/19. Shepresents to the clinic today for follow-up.   REVIEW OF SYSTEMS:   Constitutional: Denies fevers, chills or abnormal weight loss Eyes: Denies blurriness of vision Ears, nose, mouth, throat, and face: Denies mucositis or sore throat Respiratory: Denies cough, dyspnea or wheezes Cardiovascular: Denies palpitation, chest discomfort Gastrointestinal: Denies nausea, heartburn or change in bowel habits Skin: Denies abnormal skin rashes Lymphatics: Denies new lymphadenopathy or easy bruising Neurological: Denies numbness, tingling or new weaknesses Behavioral/Psych: Mood is stable, no new changes  Extremities: No lower extremity edema Breast: denies any pain or lumps or nodules in either breasts All other systems were reviewed with the patient and are negative.  I have reviewed the past medical history, past surgical history, social history and family history with the patient and they are unchanged from previous  note.  ALLERGIES:  is allergic to  amoxicillin; tape; aldactone [spironolactone]; imdur [isosorbide dinitrate]; arimidex [anastrozole]; latex; and tetracycline.  MEDICATIONS:  Current Outpatient Medications  Medication Sig Dispense Refill  . acetaminophen (TYLENOL) 325 MG tablet Take 2 tablets (650 mg total) by mouth every 6 (six) hours as needed for mild pain (or Fever >/= 101).    Marland Kitchen albuterol (PROVENTIL HFA;VENTOLIN HFA) 108 (90 Base) MCG/ACT inhaler Inhale 2 puffs into the lungs every 6 (six) hours as needed for wheezing or shortness of breath.    Marland Kitchen amiodarone (PACERONE) 200 MG tablet Take 1 tablet (200 mg total) by mouth daily. 90 tablet 3  . apixaban (ELIQUIS) 5 MG TABS tablet Take 1 tablet (5 mg total) by mouth 2 (two) times daily. 180 tablet 2  . Blood Glucose Monitoring Suppl (ACCU-CHEK GUIDE ME) w/Device KIT Use accu chek meter to check blood sugar three times daily. 1 kit 0  . budesonide-formoterol (SYMBICORT) 160-4.5 MCG/ACT inhaler INHALE TWO PUFFS INTO THE LUNGS TWICE DAILY 3 Inhaler 1  . epoetin alfa-epbx (RETACRIT) 00867 UNIT/ML injection Inject 40,000 Units into the skin every Friday.    Marland Kitchen exemestane (AROMASIN) 25 MG tablet TAKE ONE TABLET BY MOUTH DAILY AFTER BREAKFAST 90 tablet 3  . ezetimibe (ZETIA) 10 MG tablet TAKE ONE TABLET BY MOUTH EVERY DAY 90 tablet 3  . famotidine (PEPCID) 20 MG tablet One after supper 30 tablet 11  . furosemide (LASIX) 20 MG tablet Take 20 mg every other day. 30 tablet 2  . glucose blood (ACCU-CHEK GUIDE) test strip Use to test blood sugar 3 times daily 150 each 3  . insulin glargine (LANTUS) 100 UNIT/ML injection Inject 16 units under the skin once daily in the morning. 20 mL 3  . insulin lispro (HUMALOG) 100 UNIT/ML injection Inject 4-6 units under the skin three times daily before meals. 20 mL 3  . Insulin Pen Needle 32G X 4 MM MISC Use as instructed to inject insulin 4 times daily. 400 each 3  . loratadine (CLARITIN) 10 MG tablet Take 10 mg by mouth daily as needed for allergies.      . metoprolol tartrate (LOPRESSOR) 25 MG tablet Take 1 tablet (25 mg total) by mouth 2 (two) times daily. 180 tablet 3  . midodrine (PROAMATINE) 2.5 MG tablet Take 1 tablet (2.5 mg total) by mouth 3 (three) times daily with meals. 60 tablet 3  . nitroGLYCERIN (NITROSTAT) 0.4 MG SL tablet Place 1 tablet (0.4 mg total) under the tongue every 5 (five) minutes as needed for chest pain. 30 tablet 3  . potassium chloride SA (K-DUR) 20 MEQ tablet Take 1 tablet (20 mEq total) by mouth daily. 90 tablet 3  . rosuvastatin (CRESTOR) 40 MG tablet Take 1 tablet (40 mg total) by mouth at bedtime. 90 tablet 3  . zolpidem (AMBIEN) 5 MG tablet TAKE ONE TABLET BY MOUTH AT BEDTIME AS NEEDED FOR SLEEP 14 tablet 0   No current facility-administered medications for this visit.     PHYSICAL EXAMINATION: ECOG PERFORMANCE STATUS: 1 - Symptomatic but completely ambulatory  Vitals:   04/16/19 1422  BP: (!) 112/38  Pulse: 61  Resp: 17  Temp: 98 F (36.7 C)  SpO2: 99%   Filed Weights   04/16/19 1422  Weight: 155 lb 4.8 oz (70.4 kg)    GENERAL: alert, no distress and comfortable SKIN: skin color, texture, turgor are normal, no rashes or significant lesions EYES: normal, Conjunctiva are pink and non-injected, sclera clear OROPHARYNX: no  exudate, no erythema and lips, buccal mucosa, and tongue normal  NECK: supple, thyroid normal size, non-tender, without nodularity LYMPH: no palpable lymphadenopathy in the cervical, axillary or inguinal LUNGS: clear to auscultation and percussion with normal breathing effort HEART: regular rate & rhythm and no murmurs and no lower extremity edema ABDOMEN: abdomen soft, non-tender and normal bowel sounds MUSCULOSKELETAL: no cyanosis of digits and no clubbing  NEURO: alert & oriented x 3 with fluent speech, no focal motor/sensory deficits EXTREMITIES: No lower extremity edema  LABORATORY DATA:  I have reviewed the data as listed CMP Latest Ref Rng & Units 03/31/2019  03/04/2019 02/23/2019  Glucose 65 - 99 mg/dL 182(H) 155(H) 119(H)  BUN 8 - 27 mg/dL 39(H) 35(H) 33(H)  Creatinine 0.57 - 1.00 mg/dL 2.46(H) 2.32(H) 2.40(H)  Sodium 134 - 144 mmol/L 139 136 142  Potassium 3.5 - 5.2 mmol/L 4.8 4.4 4.0  Chloride 96 - 106 mmol/L 102 100 105  CO2 20 - 29 mmol/L _0 Calcium 8.7 - 10.3 mg/dL 8.8 8.8(L) 8.4(L)  Total Protein 6.0 - 8.5 g/dL 6.2 6.1(L) -  Total Bilirubin 0.0 - 1.2 mg/dL 0.6 0.8 -  Alkaline Phos 39 - 117 IU/L 81 65 -  AST 0 - 40 IU/L 36 49(H) -  ALT 0 - 32 IU/L 23 27 -    Lab Results  Component Value Date   WBC 5.6 04/10/2019   HGB 6.7 (LL) 04/10/2019   HCT 23.1 (L) 04/10/2019   MCV 93.9 04/10/2019   PLT 121 (L) 04/10/2019   NEUTROABS 4.3 04/10/2019    ASSESSMENT & PLAN:  Breast cancer of upper-inner quadrant of right female breast ([place]) Patient has had longstanding anemia where the hemoglobin was between 10 to 12 g. Since April 2020 her hemoglobin has gotten worse and today it is at 6.6. During the same timeframe her creatinine got from 2.9-5.28 U13 folic acid and iron studies were normal. 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  Current treatment:Retacritinjectionsweeklystarted7/31/2020 Bone marrow biopsy 04/08/2019: Normocellular bone marrow with trilineage hematopoiesis, absent iron stores, flow cytometry no abnormal B or T-cell clonal abnormalities noted.  FISH panel for MDS pending.  Recommendation: IV iron therapy Venofer weekly x4 We will give her an injection for Retacrit today. She will not receive Retacrit along with her IV iron because it is not allowed by her insurance. We will check her labs in 2 weeks and see if she needs blood transfusions.  Orders Placed This Encounter  Procedures  . CBC with Differential (Cancer Center Only)    Standing Status:   Standing    Number of Occurrences:   20    Standing Expiration Date:   04/15/2020  . Vitamin B12     Standing Status:   Future    Standing Expiration Date:   04/15/2020  . Sample to Blood Bank    Standing Status:   Standing    Number of Occurrences:   20    Standing Expiration Date:   04/15/2020   The patient has a good understanding of the overall plan. she agrees with it. she will call with any problems that may develop before the next visit here.  Nicholas Lose, MD 04/16/2019  Julious Oka Dorshimer am acting as scribe for Dr. Nicholas Lose.  I have reviewed the above documentation for accuracy and completeness, and I agree with the above.

## 2019-04-16 ENCOUNTER — Inpatient Hospital Stay: Payer: Medicare Other | Attending: Hematology and Oncology | Admitting: Hematology and Oncology

## 2019-04-16 ENCOUNTER — Other Ambulatory Visit: Payer: Self-pay

## 2019-04-16 ENCOUNTER — Inpatient Hospital Stay: Payer: Medicare Other

## 2019-04-16 ENCOUNTER — Ambulatory Visit (INDEPENDENT_AMBULATORY_CARE_PROVIDER_SITE_OTHER): Payer: Medicare Other | Admitting: *Deleted

## 2019-04-16 VITALS — BP 112/38 | HR 61 | Temp 98.0°F | Resp 17 | Ht 65.0 in | Wt 155.3 lb

## 2019-04-16 DIAGNOSIS — R55 Syncope and collapse: Secondary | ICD-10-CM

## 2019-04-16 DIAGNOSIS — Z170 Estrogen receptor positive status [ER+]: Secondary | ICD-10-CM | POA: Insufficient documentation

## 2019-04-16 DIAGNOSIS — Z79899 Other long term (current) drug therapy: Secondary | ICD-10-CM | POA: Insufficient documentation

## 2019-04-16 DIAGNOSIS — D509 Iron deficiency anemia, unspecified: Secondary | ICD-10-CM | POA: Diagnosis not present

## 2019-04-16 DIAGNOSIS — D638 Anemia in other chronic diseases classified elsewhere: Secondary | ICD-10-CM

## 2019-04-16 DIAGNOSIS — Z7901 Long term (current) use of anticoagulants: Secondary | ICD-10-CM | POA: Insufficient documentation

## 2019-04-16 DIAGNOSIS — E538 Deficiency of other specified B group vitamins: Secondary | ICD-10-CM | POA: Insufficient documentation

## 2019-04-16 DIAGNOSIS — C50211 Malignant neoplasm of upper-inner quadrant of right female breast: Secondary | ICD-10-CM | POA: Insufficient documentation

## 2019-04-16 DIAGNOSIS — Z7951 Long term (current) use of inhaled steroids: Secondary | ICD-10-CM | POA: Diagnosis not present

## 2019-04-16 DIAGNOSIS — Z794 Long term (current) use of insulin: Secondary | ICD-10-CM | POA: Diagnosis not present

## 2019-04-16 DIAGNOSIS — I4891 Unspecified atrial fibrillation: Secondary | ICD-10-CM

## 2019-04-16 LAB — CUP PACEART REMOTE DEVICE CHECK
Date Time Interrogation Session: 20201105205026
Implantable Pulse Generator Implant Date: 20190322

## 2019-04-16 MED ORDER — EPOETIN ALFA-EPBX 40000 UNIT/ML IJ SOLN
INTRAMUSCULAR | Status: AC
  Filled 2019-04-16: qty 1

## 2019-04-16 MED ORDER — EPOETIN ALFA-EPBX 40000 UNIT/ML IJ SOLN
40000.0000 [IU] | Freq: Once | INTRAMUSCULAR | Status: AC
  Administered 2019-04-16: 16:00:00 40000 [IU] via SUBCUTANEOUS

## 2019-04-16 NOTE — Progress Notes (Signed)
CARDIOLOGY OFFICE NOTE  Date:  04/21/2019    Hannah [name] Date of Birth: 03-Mar-1946 Medical Record #[id number]  PCP:  [name], DO  Cardiologist:  Atilano Median  Chief Complaint  Patient presents with  . Follow-up    History of Present Illness: Hannah [name] is a 73 y.o. female who presents today for a follow up visit.  Seen for Dr. Lovena Le.   She has a very complex history - this includes ahistory ofCAD s/p CABG (1998 with redo 2009), bioprosthetic AVR in 3299, chronic diastolic CHF, asthma, carotid artery disease (1-39% by duplex 10/2017, due 2021), CKD stage IV, PAF- has been on Multaq in the past and now on amiodarone, atrial tachycardia, breast CA, orthostasis,chronicanemia, thrombocytopenia, mild dilation of ascending aorta by echo 10/2018, recurrent syncope/orthostasis,recurrentpleural effusion requiring thoracentesis4/2020, HTN, GERD,&HLD.   I have followed her for many years - she has had periodic issues with volume overload, orthostasis and tachy palpitations.Last cath in 2014 - grafts patent. Myoview in 2015 without ischemia.  Over the past few months she has had more issues with her pleural effusion - diuretics caused worsening CKD/orthostasis. She has gotten more anemic - back to hematology and now on therapy. Concern for MDS and will be having bone marrow biopsy - also needing colonoscopy due to profound anemia (HGB down to 6 which is highly unusual for [name]).Most recent abdominal ultrasound noted and concerning for liver abnormality. Recent mammogram also recently noted and abnormal - ended up having repeat biopsy.  Most recent course has been eventful - she has had low dose diuretics started for recurring effusion. Progressive failure to thrive. Has been back to the hospital - treated for CHF and pneumonia. Remains anemic - needed colonoscopy. Has had abnormal mammogram with 4 biopsies. At one visit in September - she was coming in here and  got dizzy/fell in the parking lot and had significant laceration to the back of her her head. ILR interrogation was ok. Have added back Midodrine for orthostasis. Slow to recover. Has seen nephrology who thought her kidney function was stable. Ended up having CT in place of colonoscopy per GI.   Last visit with me about 3 weeks ago - slowing making progress. Was going to see ortho due to shoulder issues - from the fall in the parking lot. Blood counts still drifting. Probably going to have bone marrow biopsy. Had switched her PCP to [facility].   The patient does not have symptoms concerning for COVID-19 infection (fever, chills, cough, or new shortness of breath).   Comes in today. Here with her husband [name]. She is still pretty uneasy about moving about without someone with her. Her fear is still pretty high about falling. Weight is up a little. May be a little more short of breath. Has had BMS done - has no iron stores - starting IV iron x 4 weekly this week. No further news from GI. Has seen ortho - declined injection - was told no fracture - that CT does show worsening effusion on the right. BP fair. Some edema. Not using extra Lasix at this time. No chest pain.   Past Medical History:  Diagnosis Date  . Abnormally small mouth   . Allergic rhinitis 10/14/2009   Qualifier: Diagnosis of  By: Lamonte Sakai MD, Rose Fillers   Overview:  Overview:  Qualifier: Diagnosis of  By: Lamonte Sakai MD, Rose Fillers  Last Assessment & Plan:  Please continue Xyzal and Nasacort as you have been  using them  . Anemia   . Asthma 05/12/2009   10/12/2014 p extensive coaching HFA effectiveness =    75% s spacer    Overview:  Overview:  10/12/2014 p extensive coaching HFA effectiveness =    75% s spacer   Last Assessment & Plan:  Please continue Symbicort 2 puffs twice a day. Remember to rinse and gargle after taking this medication. Take albuterol 2 puffs up to every 4 hours if needed for shortness of breath.  Follow with Dr Lamonte Sakai in 6  month  . Bilateral carotid artery stenosis    a. mild 1-39% by duplex 10/2017, due 2021.  . Breast cancer ([place]) 01/18/2016   right breast  . CAD (coronary artery disease) 01/24/2011   a. s/p CABG 1998 with redo 2009.  Marland Kitchen Chemotherapy induced nausea and vomiting 02/24/2016  . Chemotherapy-induced peripheral neuropathy ([place]) 04/27/2016  . 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 Chronic diastolic CHF (congestive heart failure) ([place]) 09/24/2013  . CKD (chronic kidney disease), stage IV ([place]) 09/24/2013  . Closed fracture of head of left humerus 09/09/2017  . Complication of anesthesia   . Essential hypertension 05/12/2009   Qualifier: Diagnosis of  By: Lamonte Sakai MD, Rose Fillers   . Gallstones   . GERD (gastroesophageal reflux disease)   . Glaucoma   . H/O atrial tachycardia 05/12/2009   Qualifier: History of  By: Lamonte Sakai MD, Rose Fillers   Overview:  Overview:  Qualifier: History of  By: Lamonte Sakai MD, Rose Fillers  Last Assessment & Plan:  There was no evidence of this on her cardiac monitor. I would recommend watchful waiting.   Marland Kitchen History of non-ST elevation myocardial infarction (NSTEMI)    Sept 2014--  thought to be type II HTN w/ LHC without infarct related artery and patent grafts  . History of radiation therapy 05/24/16-07/26/16   right breast 50.4 Gy in 28 fractions, right breast boost 10 Gy in 5 fractions  . Hyperlipidemia   . Hypertension    Resolved.  . Iron deficiency anemia   . Mania ([place]) 03/05/2016  . Moderate persistent asthma    pulmologist-  Dr. Malvin Johns  . Osteomyelitis of toe of left foot ([place]) 04/06/2016  . Osteopenia of multiple sites 10/19/2015  . PAF (paroxysmal atrial fibrillation) ([place])   . Personal history of chemotherapy 2017  . Personal history of radiation therapy 2017  . PONV (postoperative nausea and vomiting)   . Port catheter in place 02/17/2016  . Psoriasis    right leg  . Renal calculus, right   . S/P AVR    prosthesis valve placement 2009 at same time re-do CABG  .  Seizure-like activity ([place]) 09/01/2017  . Sensorineural hearing loss (SNHL) of both ears 01/06/2016  . Stroke [facility])    residual rt hearing loss  . Syncope 09/06/2017  . Type 2 diabetes mellitus ([place])    monitored by dr Dwyane Dee    Past Surgical History:  Procedure Laterality Date  . AORTIC VALVE REPLACEMENT  2009   [facility]done same time is CABG)  . BREAST LUMPECTOMY Right 01/18/2016  . BREAST LUMPECTOMY WITH RADIOACTIVE SEED AND SENTINEL LYMPH NODE BIOPSY Right 01/18/2016   Procedure: RIGHT BREAST LUMPECTOMY WITH RADIOACTIVE SEED AND SENTINEL LYMPH NODE BIOPSY;  Surgeon: DAlphonsa Overall MD;  [facility]  Service: General;  Laterality: Right;  . CARDIAC CATHETERIZATION  03/23/2008   Pre-redo CABG: L main OK, LAD (T), CFX (T), OM1 99%, RCA (T), LIMA-LAD OK, SVG-OM(?3)  OK w/ little florw to OM2, SVG-RCA OK. EF NL  . CARPAL TUNNEL RELEASE    . CHOLECYSTECTOMY N/A 07/07/2018   Procedure: LAPAROSCOPIC CHOLECYSTECTOMY WITH INTRAOPERATIVE CHOLANGIOGRAM ERAS PATHWAY;  Surgeon: Alphonsa Overall, MD;  Location: [facility];  Service: General;  Laterality: N/A;  . COLONOSCOPY     around 2015. Possibly with [facility]   . CORONARY ARTERY BYPASS GRAFT  1998 &  re-do 2009   Had LIMA to DX/LAD, SVG to 2 marginal branches and SVG to [facility] originally; SVG to 3rd OM and PD at time of redo  . CYSTOSCOPY W/ URETERAL STENT PLACEMENT Right 12/20/2014   Procedure: CYSTOSCOPY WITH RETROGRADE PYELOGRAM/URETERAL STENT PLACEMENT;  Surgeon: Cleon Gustin, MD;  [facility];  Service: Urology;  Laterality: Right;  . ESOPHAGOGASTRODUODENOSCOPY     many years ago per patient   . ESOPHAGOGASTRODUODENOSCOPY ENDOSCOPY  06/17/2018  . EYE SURGERY Bilateral    cataracts  . HOLMIUM LASER APPLICATION Right 06/11/7508   Procedure:  HOLMIUM LASER LITHOTRIPSY;  Surgeon: Cleon Gustin, MD;  Location[facility];  Service: Urology;  Laterality: Right;  . IR  THORACENTESIS ASP PLEURAL SPACE W/IMG GUIDE  10/03/2018  . LEFT HEART CATHETERIZATION WITH CORONARY/GRAFT ANGIOGRAM N/A 02/23/2013   Procedure: LEFT HEART CATHETERIZATION WITH Beatrix Fetters;  Surgeon: Blane Ohara, MD;  Location: [facility] CATH LAB;  Service: Cardiovascular;  Laterality: N/A;  . LOOP RECORDER INSERTION N/A 08/30/2017   Procedure: LOOP RECORDER INSERTION;  Surgeon: Evans Lance, MD;  Location: [place] CV LAB;  Service: Cardiovascular;  Laterality: N/A;  . PORTACATH PLACEMENT Left 01/18/2016   Procedure: INSERTION PORT-A-CATH;  Surgeon: Alphonsa Overall, MD;  Location: [facility];  Service: General;  Laterality: Left;  . portacath removal    . TONSILLECTOMY    . TRANSTHORACIC ECHOCARDIOGRAM  02-24-2013      mild LVH,  ef 50-55%/  AV bioprosthesis was present with very mild stenosis and no regurg., mean grandient 7mHg, peak grandient 243mg /  mild MR/  mild LAE and RAE/  moderate TR  . TUBAL LIGATION       Medications: Current Meds  Medication Sig  . acetaminophen (TYLENOL) 325 MG tablet Take 2 tablets (650 mg total) by mouth every 6 (six) hours as needed for mild pain (or Fever >/= 101).  . Marland Kitchenlbuterol (PROVENTIL HFA;VENTOLIN HFA) 108 (90 Base) MCG/ACT inhaler Inhale 2 puffs into the lungs every 6 (six) hours as needed for wheezing or shortness of breath.  . Marland Kitchenmiodarone (PACERONE) 200 MG tablet Take 1 tablet (200 mg total) by mouth daily.  . Marland Kitchenpixaban (ELIQUIS) 5 MG TABS tablet Take 1 tablet (5 mg total) by mouth 2 (two) times daily.  . Blood Glucose Monitoring Suppl (ACCU-CHEK GUIDE ME) w/Device KIT Use accu chek meter to check blood sugar three times daily.  . budesonide-formoterol (SYMBICORT) 160-4.5 MCG/ACT inhaler INHALE TWO PUFFS INTO THE LUNGS TWICE DAILY  . epoetin alfa-epbx (RETACRIT) 4025852NIT/ML injection Inject 40,000 Units into the skin every Friday.  . Marland Kitchenxemestane (AROMASIN) 25 MG tablet TAKE ONE TABLET BY MOUTH DAILY AFTER BREAKFAST  . ezetimibe (ZETIA) 10 MG  tablet TAKE ONE TABLET BY MOUTH EVERY DAY  . famotidine (PEPCID) 20 MG tablet One after supper  . furosemide (LASIX) 20 MG tablet Take 20 mg every other day.  . Marland Kitchenlucose blood (ACCU-CHEK GUIDE) test strip Use to test blood sugar 3 times daily  . insulin glargine (LANTUS) 100 UNIT/ML injection Inject 16 units under the  skin once daily in the morning.  . insulin lispro (HUMALOG) 100 UNIT/ML injection Inject 4-6 units under the skin three times daily before meals.  . Insulin Pen Needle 32G X 4 MM MISC Use as instructed to inject insulin 4 times daily.  Marland Kitchen loratadine (CLARITIN) 10 MG tablet Take 10 mg by mouth daily as needed for allergies.   . metoprolol tartrate (LOPRESSOR) 25 MG tablet Take 1 tablet (25 mg total) by mouth 2 (two) times daily.  . midodrine (PROAMATINE) 2.5 MG tablet Take 1 tablet (2.5 mg total) by mouth 3 (three) times daily with meals.  . nitroGLYCERIN (NITROSTAT) 0.4 MG SL tablet Place 1 tablet (0.4 mg total) under the tongue every 5 (five) minutes as needed for chest pain.  . potassium chloride SA (K-DUR) 20 MEQ tablet Take 1 tablet (20 mEq total) by mouth daily.  . rosuvastatin (CRESTOR) 40 MG tablet Take 1 tablet (40 mg total) by mouth at bedtime.  Marland Kitchen zolpidem (AMBIEN) 5 MG tablet TAKE ONE TABLET BY MOUTH AT BEDTIME AS NEEDED FOR SLEEP     Allergies: Allergies  Allergen Reactions  . Amoxicillin Rash and Other (See Comments)    Tolerates Cephalosporins Has patient had a PCN reaction causing immediate rash, facial/tongue/throat swelling, SOB or lightheadedness with hypotension: Yes Has patient had a PCN reaction causing severe rash involving mucus membranes or skin necrosis: Yes Has patient had a PCN reaction that required hospitalization No Has patient had a PCN reaction occurring within the last 10 years: No If all of the above answers are "NO", then may proceed with Cephalosporin use.   . Tape Other (See Comments)    Must use paper tape  . Aldactone [Spironolactone]  Other (See Comments)    CKD/hypokalemia  . Imdur [Isosorbide Dinitrate] Other (See Comments)    Headache/severe hypotension/Syncope  . Arimidex [Anastrozole] Nausea Only  . Latex Itching and Other (See Comments)    (Dentist office)  . Tetracycline Rash    Social History: The patient  reports that she has never smoked. She has never used smokeless tobacco. She reports that she does not drink alcohol or use drugs.   Family History: The patient's family history includes Diabetes in her maternal grandmother and son; Healthy in her brother; Heart attack in her brother; Heart disease in her brother, father, and maternal grandmother; Heart failure in her father.   Review of Systems: Please see the history of present illness.   All other systems are reviewed and negative.   Physical Exam: VS:  BP (!) 110/52   Pulse 62   Ht _0  (1.651 m)   Wt 157 lb 12.8 oz (71.6 kg)   SpO2 99%   BMI 26.26 kg/m  .  BMI Body mass index is 26.26 kg/m.  Wt Readings from Last 3 Encounters:  04/21/19 157 lb 12.8 oz (71.6 kg)  04/16/19 155 lb 4.8 oz (70.4 kg)  04/02/19 154 lb 12.8 oz (70.2 kg)    General: Pleasant. Alert and in no acute distress. Looks a little better today. Color remains sallow.  HEENT: Normal. Hematoma of the head slowly improving.  Neck: Supple, no JVD, carotid bruits, or masses noted.  Cardiac: Regular rate and rhythm. Harsh outflow murmur noted. Trace edema.  Respiratory:  Lungs are clear to auscultation bilaterally with normal work of breathing but definite decreased sounds over the right mid/lower lobe.  GI: Soft and nontender.  MS: No deformity or atrophy. Gait and ROM intact.  Skin: Warm and dry. Color  is normal.  Neuro:  Strength and sensation are intact and no gross focal deficits noted.  Psych: Alert, appropriate and with normal affect.   LABORATORY DATA:  EKG:  EKG is ordered today. This demonstrates NSR with 1st degree AV block.  Lab Results  Component Value Date    WBC 5.6 04/10/2019   HGB 6.7 (LL) 04/10/2019   HCT 23.1 (L) 04/10/2019   PLT 121 (L) 04/10/2019   GLUCOSE 182 (H) 03/31/2019   CHOL 106 12/02/2018   TRIG 97 12/02/2018   HDL 34 (L) 12/02/2018   LDLDIRECT 53.0 10/08/2016   LDLCALC 53 12/02/2018   ALT 23 03/31/2019   AST 36 03/31/2019   NA 139 03/31/2019   K 4.8 03/31/2019   CL 102 03/31/2019   CREATININE 2.46 (H) 03/31/2019   BUN 39 (H) 03/31/2019   CO2 22 03/31/2019   TSH 2.900 01/19/2019   INR 1.9 (H) 03/04/2019   HGBA1C 5.6 02/09/2019   MICROALBUR 53.4 (H) 05/12/2018     BNP (last 3 results) Recent Labs    03/04/19 1429  BNP 273.4*    ProBNP (last 3 results) Recent Labs    10/28/18 1041 02/19/19 1126 02/23/19 1221  PROBNP 364.0* 2,996* 2,825*     Other Studies Reviewed Today:  CT ABDOMENIMPRESSION10/2020: Chest Impression:  1. Mild RIGHT basilar atelectasis and small RIGHT effusion. 2. Increased skin thickening in the lateral RIGHT breast. Recommend clinical correlation. 3. Stable seroma in the RIGHT breast.  Abdomen / Pelvis Impression:  1. No acute findings in the abdomen pelvis. 2. Nonobstructing calculi in the LEFT renal pelvis. 3. Degenerative endplate change in the lumbar spine similar to comparison CT 2019. 4. Coronary artery calcification and Aortic Atherosclerosis (ICD10-I70.0).   Electronically Signed By: Suzy Bouchard M.D. On: 03/17/2019 09:59   CXRIMPRESSION9/04/2019: Small right effusion and right lower lobe airspace disease unchanged. Negative for edema.   Electronically Signed By: Franchot Gallo M.D. On: 02/20/2019 15:39   ABD [place] 02/17/2019 Other findings: There is a somewhat lobulated fluid collection identified along the inferior aspect of the liver. This measures 2.6 x 3.4 x 4.3 cm. This was not seen on the prior CT examination and may be related to a focal fluid collection from prior cholecystectomy. Mild perihepatic ascites is noted.   Note is made of right-sided pleural effusion.  IMPRESSION: Status post cholecystectomy. There is a lobulated cystic area as described above along the inferior right hepatic margin. This could be related to the prior surgery possibly representing a small seroma. CT may be helpful for further evaluation.  Small right pleural effusion.  Mild ascites.  Increased echogenicity within the liver likely related to fatty infiltration or underlying hepatocellular disease.  Electronically Signed By: Inez Catalina M.D. On: 02/17/2019 10:18  Breast Ultrasound 01/2019 IMPRESSION: 1. New irregular hypoechoic mass along the posterior margin of the scar right breast 8 o'clock position. Findings are nonspecific however concerning for the possibility of localized recurrence. 2. Within the outer aspect of the right axilla there are two adjacent irregular hypoechoic masses just deep to the skin which are nonspecific. These may represent focal fat necrosis or potentially recurrent or metastatic disease.  RECOMMENDATION: 1. Ultrasound-guided core needle biopsy right breast mass 8 o'clock position just deep to the scar. 2. Ultrasound-guided core needle biopsy of both irregular hypoechoic masses within the outer aspect of the right axilla.     ECHOIMPRESSIONS5/2020  1. The left ventricle has normal systolic function with an ejection fraction of 60-65%. The cavity size  was normal. There is mildly increased left ventricular wall thickness. Left ventricular diastolic Doppler parameters are consistent with  pseudonormalization. 2. The right ventricle has normal systolic function. The cavity was mildly enlarged. 3. Left atrial size was moderately dilated. 4. Right atrial size was mildly dilated. 5. The mitral valve is grossly normal. Mild thickening of the mitral valve leaflet. There is mild mitral annular calcification present. 6. The tricuspid valve is grossly normal. 7.  There is mild dilatation of the ascending aorta measuring 40 mm. 8. Normal LV systolic function; moderate diastolic dysfunction; mild LVH; mildly dilated ascending aorta; s/p AVR with mean gradient of 18 mmHg and no AI; biatrial enlargement; mild MR; mild RVE.   EchoStudy Conclusions8/2019  - Left ventricle: The cavity size was normal. Systolic function was normal. The estimated ejection fraction was in the range of 60% to 65%.Wall motion was normal; there were no regional wall motion abnormalities. Features are consistent with a pseudonormal left ventricular filling pattern, with concomitant abnormal relaxation and increased filling pressure (grade 2 diastolic dysfunction). Doppler parameters are consistent with high ventricular filling pressure. - Aortic valve: A bioprosthesis was present and functioning normally. Mean gradient (S): 16 mm Hg. - Mitral valve: Calcified annulus. There was mild regurgitation. - Left atrium: The atrium was moderately dilated. - Right ventricle: The cavity size was mildly dilated. Wall thickness was normal. - Tricuspid valve: There was mild regurgitation. - Pulmonary arteries: Systolic pressure could not be accurately estimated.   MYOVIEW FINDINGS FROM 06/2013: Normal resting EKG. Slight ST depressions noted in aVL after administration of lexiscan. Mild shortness of breath and nausea resolved after the test. EKG is nondiagnostic for ischemia. TID ratio 1.05. Lung-heart ratio 0.43. Normal ventricular chamber size.  IMPRESSION: No evidence for ischemia. Normal wall motion. LVEF 72%.  Electronically Signed By: Guy Sandifer   Cardiac Cath: 02/23/2013 Left mainstem: Normal  Left anterior descending (LAD): Severe proximal and mid calcification with long proximal 95% stenosis. The mid and distal vessel is small but free of high grade disease.  Left circumflex (LCx):AV groove has a mid 90% stenosis before a moderate  sized MOM. OM1 and OM2 are occluded at the ostium and fill via the SVG. OM3 occluded at the ostium and fills via SVG. The grafted OMs are small and diffusely disease.  Right coronary artery (RCA): Occluded in the mid vessel. The PDA is moderate sized and occluded at the ostium. There is a 70% stenosis in the PDA after the insertion of the vein graft.  Grafts:  LIMA to LAD: Patent  SVG to RCA: Occluded (from original CABG)  SVG to RCA: Patent (from redo CABG). This is mild diffuse plaque within the vein graft.  SVG to OM3: Patent. This is mild diffuse plaque within the vein graft.  SVG sequential to OM1/OM2: Patent. There is ostial 50% stenosis. There is a patent proximal SVG stent. The native marginals are very small.  Left ventriculography: LV not injected. AVR not crossed.  Final Conclusions: Severe native vessel CAD. Patent grafts as described with nonobstructive disease in the grafts. A stent placed into the sequential SVG is patent with some disease at the ostium that on several views in not occlusive. I did compare the 2009 cath with results today. There is high grade disease in the circumflex AV groove. However, this is not changed from 2009. However, this lesion does appear to lead into a non grafted OM. If she has further symptoms I would consider PCI of the native circumflex.  Assessment/Plan:  1.Fall/dizziness ? Orthostatic - with subsequent laceration to the back of her head - still slow to recover.     2. Chronic diastolic HF -weight is trending up - needs to use extra dose of Lasix prn weight gain over 2 pounds. Needs her CXR updated.   3.Recurrent pleural effusion - looks to be increasing by the CT on her shoulder - sending for CXR today. May need thoracentesis.   4.Abnormal Abd [place] -CT of the abdomen previously noted.  Not discussed.   5.Profound anemia - last HGB noted - she was transfused again - starting IV iron this week. [place] lab today. This certainly  plays a role in her overall condition.   6. CAD/aortic valve disease - remote CABG/AVR - managed medically - she has no active symptoms at this time - would continue with medical therapy.   7. CKD - stage IV -she has had recent visit with Dr. Posey Pronto.This was stable.  8.History of orthostasis - reports negative adrenal work up - I see where she had an [place] from March 2019 - still not sure if we need to re-consider this going forward.Still may need to consider. She is using Midodrine TID. She does keep a good check on her BP for [place].   9. PAF - in sinuson exam and by EKG today- on amiodarone - needs surveillance labs today.  10.Carotid disease-noted 1 to 39% bilateral stenosis from study 10/2017 -will need repeat study 10/2019- not discussed today  11. Prior breast cancer - recent abnormal mammogram - s/p biopsies.Abnormality noted on the CT - she has had recent biopsies that were negative.  12. Recent fall/dizziness with injury -see above.  13 Health maintenance - switching primary care. Will need to update.   63. COVID-19 Education: The signs and symptoms of COVID-19 were discussed with the patient and how to seek care for testing (follow up with PCP or arrange E-visit).  The importance of social distancing, staying at home, hand hygiene and wearing a mask when out in public were discussed today.  Current medicines are reviewed with the patient today.  The patient does not have concerns regarding medicines other than what has been noted above.  The following changes have been made:  See above.  Labs/ tests ordered today include:    Orders Placed This Encounter  Procedures  . DG Chest 2 View  . Basic metabolic panel  . CBC no Diff  . Hepatic function panel  . TSH  . EKG 12-Lead     Disposition:   FU with me in 3 weeks.    Patient is agreeable to this plan and will call if any problems develop in the interim.   SignedTruitt Merle, NP  04/21/2019  10:43 AM  [facility] [address] [place], [place]  [address] Phone: [phone] Fax: [phone]

## 2019-04-16 NOTE — Progress Notes (Signed)
NO labs drawn on 11/5 labs were used from previous CBC.

## 2019-04-16 NOTE — Assessment & Plan Note (Signed)
Patient has had longstanding anemia where the hemoglobin was between 10 to 12 g. Since April 2020 her hemoglobin has gotten worse and today it is at 6.6. During the same timeframe her creatinine got from 7.3-7.30 A16 folic acid and iron studies were normal. 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  Current treatment:Retacritinjectionsweeklystarted7/31/2020 Bone marrow biopsy 04/08/2019: Normocellular bone marrow with trilineage hematopoiesis, absent iron stores, flow cytometry no abnormal B or T-cell clonal abnormalities noted.  FISH panel for MDS pending.  Recommendation: IV iron therapy Venofer weekly x4

## 2019-04-16 NOTE — Patient Instructions (Signed)

## 2019-04-17 ENCOUNTER — Encounter (HOSPITAL_COMMUNITY): Payer: Self-pay | Admitting: Hematology and Oncology

## 2019-04-17 ENCOUNTER — Telehealth: Payer: Self-pay | Admitting: Hematology and Oncology

## 2019-04-17 NOTE — Telephone Encounter (Signed)
I talk with patient regarding schedule  

## 2019-04-21 ENCOUNTER — Other Ambulatory Visit: Payer: Self-pay

## 2019-04-21 ENCOUNTER — Ambulatory Visit
Admission: RE | Admit: 2019-04-21 | Discharge: 2019-04-21 | Disposition: A | Discharge status: Home / Self Care | Payer: Medicare Other | Source: Ambulatory Visit | Attending: Nurse Practitioner | Admitting: Nurse Practitioner

## 2019-04-21 ENCOUNTER — Other Ambulatory Visit: Payer: Self-pay | Admitting: *Deleted

## 2019-04-21 ENCOUNTER — Encounter: Payer: Self-pay | Admitting: Nurse Practitioner

## 2019-04-21 ENCOUNTER — Ambulatory Visit (INDEPENDENT_AMBULATORY_CARE_PROVIDER_SITE_OTHER): Payer: Medicare Other | Admitting: Nurse Practitioner

## 2019-04-21 VITALS — BP 110/52 | HR 62 | Ht 65.0 in | Wt 157.8 lb

## 2019-04-21 DIAGNOSIS — I951 Orthostatic hypotension: Secondary | ICD-10-CM | POA: Diagnosis not present

## 2019-04-21 DIAGNOSIS — Z79899 Other long term (current) drug therapy: Secondary | ICD-10-CM

## 2019-04-21 DIAGNOSIS — I428 Other cardiomyopathies: Secondary | ICD-10-CM

## 2019-04-21 DIAGNOSIS — J90 Pleural effusion, not elsewhere classified: Secondary | ICD-10-CM | POA: Diagnosis not present

## 2019-04-21 LAB — HEPATIC FUNCTION PANEL
ALT: 25 IU/L (ref 0–32)
AST: 38 IU/L (ref 0–40)
Albumin: 2.9 g/dL — ABNORMAL LOW (ref 3.7–4.7)
Alkaline Phosphatase: 60 IU/L (ref 39–117)
Bilirubin Total: 0.6 mg/dL (ref 0.0–1.2)
Bilirubin, Direct: 0.29 mg/dL (ref 0.00–0.40)
Total Protein: 6.1 g/dL (ref 6.0–8.5)

## 2019-04-21 LAB — BASIC METABOLIC PANEL
BUN/Creatinine Ratio: 14 (ref 12–28)
BUN: 32 mg/dL — ABNORMAL HIGH (ref 8–27)
CO2: 22 mmol/L (ref 20–29)
Calcium: 8.8 mg/dL (ref 8.7–10.3)
Chloride: 104 mmol/L (ref 96–106)
Creatinine, Ser: 2.32 mg/dL — ABNORMAL HIGH (ref 0.57–1.00)
GFR calc Af Amer: 23 mL/min/{1.73_m2} — ABNORMAL LOW (ref >59)
GFR calc non Af Amer: 20 mL/min/{1.73_m2} — ABNORMAL LOW (ref >59)
Glucose: 144 mg/dL — ABNORMAL HIGH (ref 65–99)
Potassium: 4.8 mmol/L (ref 3.5–5.2)
Sodium: 139 mmol/L (ref 134–144)

## 2019-04-21 LAB — CBC
Hematocrit: 29.9 % — ABNORMAL LOW (ref 34.0–46.6)
Hemoglobin: 9.1 g/dL — ABNORMAL LOW (ref 11.1–15.9)
MCH: 26.5 pg — ABNORMAL LOW (ref 26.6–33.0)
MCHC: 30.4 g/dL — ABNORMAL LOW (ref 31.5–35.7)
MCV: 87 fL (ref 79–97)
Platelets: 139 10*3/uL — ABNORMAL LOW (ref 150–450)
RBC: 3.43 x10E6/uL — ABNORMAL LOW (ref 3.77–5.28)
RDW: 15.4 % (ref 11.7–15.4)
WBC: 7.7 10*3/uL (ref 3.4–10.8)

## 2019-04-21 LAB — TSH: TSH: 3.04 u[IU]/mL (ref 0.450–4.500)

## 2019-04-21 MED ORDER — DOXYCYCLINE HYCLATE 100 MG PO TBEC: 100.0000 mg | DELAYED_RELEASE_TABLET | Freq: Two times a day (BID) | ORAL | 0 refills | Status: DC

## 2019-04-21 NOTE — Patient Instructions (Signed)
After Visit Summary:  We will be checking the following labs today - BMET, CBC, HPF and TSH  Please go to [facility] to [facility] on the first floor for a chest Xray - you may walk in.     Medication Instructions:    Continue with your current medicines.   Take an extra dose of Lasix for weight gain over 2 pounds overnight   If you need a refill on your cardiac medications before your next appointment, please call your pharmacy.     Testing/Procedures To Be Arranged:  N/A  Follow-Up:   See me in 3 weeks    At [facility], you and your health needs are our priority.  As part of our continuing mission to provide you with exceptional heart care, we have created designated Provider Care Teams.  These Care Teams include your primary Cardiologist (physician) and Advanced Practice Providers (APPs -  Physician Assistants and Nurse Practitioners) who all work together to provide you with the care you need, when you need it.  Special Instructions:  . Stay safe, stay home, wash your hands for at least 20 seconds and wear a mask when out in public.  . It was good to talk with you today.    Call the [place] office at [phone] if you have any questions, problems or concerns.

## 2019-04-24 ENCOUNTER — Other Ambulatory Visit: Payer: Self-pay

## 2019-04-24 ENCOUNTER — Inpatient Hospital Stay: Payer: Medicare Other

## 2019-04-24 VITALS — BP 134/54 | HR 55 | Temp 98.5°F | Resp 16

## 2019-04-24 DIAGNOSIS — C50211 Malignant neoplasm of upper-inner quadrant of right female breast: Secondary | ICD-10-CM | POA: Diagnosis not present

## 2019-04-24 DIAGNOSIS — Z170 Estrogen receptor positive status [ER+]: Secondary | ICD-10-CM

## 2019-04-24 DIAGNOSIS — D638 Anemia in other chronic diseases classified elsewhere: Secondary | ICD-10-CM

## 2019-04-24 DIAGNOSIS — D509 Iron deficiency anemia, unspecified: Secondary | ICD-10-CM

## 2019-04-24 LAB — CBC WITH DIFFERENTIAL (CANCER CENTER ONLY)
Abs Immature Granulocytes: 0.01 10*3/uL (ref 0.00–0.07)
Basophils Absolute: 0.1 10*3/uL (ref 0.0–0.1)
Basophils Relative: 1 %
Eosinophils Absolute: 0.2 10*3/uL (ref 0.0–0.5)
Eosinophils Relative: 4 %
HCT: 31.8 % — ABNORMAL LOW (ref 36.0–46.0)
Hemoglobin: 9 g/dL — ABNORMAL LOW (ref 12.0–15.0)
Immature Granulocytes: 0 %
Lymphocytes Relative: 9 %
Lymphs Abs: 0.5 10*3/uL — ABNORMAL LOW (ref 0.7–4.0)
MCH: 26.1 pg (ref 26.0–34.0)
MCHC: 28.3 g/dL — ABNORMAL LOW (ref 30.0–36.0)
MCV: 92.2 fL (ref 80.0–100.0)
Monocytes Absolute: 0.5 10*3/uL (ref 0.1–1.0)
Monocytes Relative: 10 %
Neutro Abs: 4.1 10*3/uL (ref 1.7–7.7)
Neutrophils Relative %: 76 %
Platelet Count: 140 10*3/uL — ABNORMAL LOW (ref 150–400)
RBC: 3.45 MIL/uL — ABNORMAL LOW (ref 3.87–5.11)
RDW: 17 % — ABNORMAL HIGH (ref 11.5–15.5)
WBC Count: 5.4 10*3/uL (ref 4.0–10.5)
nRBC: 0 % (ref 0.0–0.2)

## 2019-04-24 LAB — SAMPLE TO BLOOD BANK

## 2019-04-24 LAB — VITAMIN B12: Vitamin B-12: 297 pg/mL (ref 180–914)

## 2019-04-24 MED ORDER — ACETAMINOPHEN 325 MG PO TABS
650.0000 mg | ORAL_TABLET | Freq: Once | ORAL | Status: AC
  Administered 2019-04-24: 650 mg via ORAL

## 2019-04-24 MED ORDER — ACETAMINOPHEN 325 MG PO TABS
ORAL_TABLET | ORAL | Status: AC
  Filled 2019-04-24: qty 2

## 2019-04-24 MED ORDER — SODIUM CHLORIDE 0.9 % IV SOLN
200.0000 mg | Freq: Once | INTRAVENOUS | Status: AC
  Administered 2019-04-24: 15:00:00 200 mg via INTRAVENOUS
  Filled 2019-04-24: qty 10

## 2019-04-24 MED ORDER — SODIUM CHLORIDE 0.9 % IV SOLN
Freq: Once | INTRAVENOUS | Status: AC
  Administered 2019-04-24: 14:00:00 via INTRAVENOUS
  Filled 2019-04-24: qty 250

## 2019-04-28 ENCOUNTER — Ambulatory Visit
Admission: RE | Admit: 2019-04-28 | Discharge: 2019-04-28 | Disposition: A | Discharge status: Home / Self Care | Payer: Medicare Other | Source: Ambulatory Visit | Attending: Nurse Practitioner | Admitting: Nurse Practitioner

## 2019-04-28 ENCOUNTER — Other Ambulatory Visit: Payer: Self-pay | Admitting: Nurse Practitioner

## 2019-04-28 ENCOUNTER — Other Ambulatory Visit: Payer: Self-pay | Admitting: *Deleted

## 2019-04-28 DIAGNOSIS — J90 Pleural effusion, not elsewhere classified: Secondary | ICD-10-CM

## 2019-04-30 NOTE — Progress Notes (Signed)
[facility] Summary Report / Loop Recorder 

## 2019-05-01 ENCOUNTER — Other Ambulatory Visit: Payer: Self-pay

## 2019-05-01 ENCOUNTER — Inpatient Hospital Stay: Payer: Medicare Other

## 2019-05-01 VITALS — BP 148/48 | HR 54 | Temp 98.6°F | Resp 16

## 2019-05-01 DIAGNOSIS — C50211 Malignant neoplasm of upper-inner quadrant of right female breast: Secondary | ICD-10-CM | POA: Diagnosis not present

## 2019-05-01 DIAGNOSIS — D638 Anemia in other chronic diseases classified elsewhere: Secondary | ICD-10-CM

## 2019-05-01 DIAGNOSIS — Z170 Estrogen receptor positive status [ER+]: Secondary | ICD-10-CM

## 2019-05-01 MED ORDER — SODIUM CHLORIDE 0.9 % IV SOLN
200.0000 mg | Freq: Once | INTRAVENOUS | Status: AC
  Administered 2019-05-01: 200 mg via INTRAVENOUS
  Filled 2019-05-01: qty 10

## 2019-05-01 MED ORDER — ACETAMINOPHEN 325 MG PO TABS
650.0000 mg | ORAL_TABLET | Freq: Once | ORAL | Status: AC
  Administered 2019-05-01: 650 mg via ORAL

## 2019-05-01 MED ORDER — SODIUM CHLORIDE 0.9 % IV SOLN
Freq: Once | INTRAVENOUS | Status: AC
  Administered 2019-05-01: 15:00:00 via INTRAVENOUS
  Filled 2019-05-01: qty 250

## 2019-05-01 NOTE — Patient Instructions (Signed)

## 2019-05-05 NOTE — Progress Notes (Signed)
CARDIOLOGY OFFICE NOTE  Date:  05/12/2019    Hannah [name] Date of Birth: 1945-07-09 Medical Record #[id number]  PCP:  [name], DO  Cardiologist:  Atilano Median   Chief Complaint  Patient presents with  . Follow-up    History of Present Illness: Hannah [name] is a 73 y.o. female who presents today for a follow up visit.  Seen for Dr. Lovena Le. Primarily follows with me.   She has a very complex/extensive history - this includes ahistory ofCAD s/p CABG (1998 with redo 2009), bioprosthetic AVR in 3212, chronic diastolic CHF, asthma, carotid artery disease (1-39% by duplex 10/2017, due 2021), CKD stage IV, PAF- has been on Multaq in the past and now on amiodarone, atrial tachycardia, breast CA, orthostasis,chronicanemia, thrombocytopenia, mild dilation of ascending aorta by echo 10/2018, recurrent syncope/orthostasis,recurrentpleural effusion requiring thoracentesis4/2020, HTN, GERD,&HLD.   I have followed her for many years - she has had periodic issues with volume overload, orthostasis and tachy palpitations.Last cath in 2014 - grafts patent. Myoview in 2015 without ischemia.  Over the past few months she has had more issues with her pleural effusion - diuretics caused worsening CKD/orthostasis. She has gotten more anemic - back to hematology and now on therapy. Concern for MDS and will be having bone marrow biopsy - also needing colonoscopy due to profound anemia (HGB down to 6 which is highly unusual for Hannah [name]).Most recent abdominal ultrasound noted and concerning for liver abnormality. Recent mammogram also recently noted and abnormal - ended up having multiple repeat biopsy.  Most recent course has been eventful - she has had to be placed back on low dose diuretics for recurring effusion. Progressive failure to thrive. Has been back to the hospital - treated for CHF and pneumonia. Remains markedly anemic - needed colonoscopy. Has had abnormal mammogram  with 4 biopsies. At one visit in September - she was coming in here and got dizzy/fell in the parking lot and had significant laceration to the back of her her head. ILR interrogation was ok. Have added back Midodrine for orthostasis. Slow to recover and make progress. Has seen nephrology who thought her kidney function was stable. Ended up having CT in place of colonoscopy per GI.   Seen last month - still very fearful of another fall/syncopal/orthostatic episode. Little more short of breath. Repeat CXR with increasing effusion and concern for pneumonia (again) - given antibiotics and increased her diuretics with very improvement - ended up getting CT scan - see below.   She was seen by Dr. Lindi Adie this past Friday - I had routed her CT to him - she will be having repeat thoracentesis - he did not feet further testing needed in regards to the findings on the breast.   The patient does not have symptoms concerning for COVID-19 infection (fever, chills, cough, or new shortness of breath).   Comes in today. Here alone. Her grand daughter brought her today. Hannah [name] is not driving. She does not feel like she is strong enough to drive. Some occasional chest pain - seems more after eating - not with exertion. She has NTG on hand - has not used. Getting more short of breath - her thoracentesis is for Friday - she was going to hold her Eliquis for 48 hours prior. She is a little anxious about this. She is watching her weight. BP still fluctuates at times - she is adjusting Lasix and watching hydration closely. Hematoma of her head is not as big -  still rather black in appearance.   Past Medical History:  Diagnosis Date  . Abnormally small mouth   . Allergic rhinitis 10/14/2009   Qualifier: Diagnosis of  By: Lamonte Sakai MD, Rose Fillers   Overview:  Overview:  Qualifier: Diagnosis of  By: Lamonte Sakai MD, Rose Fillers  Last Assessment & Plan:  Please continue Xyzal and Nasacort as you have been using them  . Anemia   . Asthma  05/12/2009   10/12/2014 p extensive coaching HFA effectiveness =    75% s spacer    Overview:  Overview:  10/12/2014 p extensive coaching HFA effectiveness =    75% s spacer   Last Assessment & Plan:  Please continue Symbicort 2 puffs twice a day. Remember to rinse and gargle after taking this medication. Take albuterol 2 puffs up to every 4 hours if needed for shortness of breath.  Follow with Dr Lamonte Sakai in 6 month  . Bilateral carotid artery stenosis    a. mild 1-39% by duplex 10/2017, due 2021.  . Breast cancer ([place]) 01/18/2016   right breast  . CAD (coronary artery disease) 01/24/2011   a. s/p CABG 1998 with redo 2009.  Marland Kitchen Chemotherapy induced nausea and vomiting 02/24/2016  . Chemotherapy-induced peripheral neuropathy ([place]) 04/27/2016  . 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 Chronic diastolic CHF (congestive heart failure) ([place]) 09/24/2013  . CKD (chronic kidney disease), stage IV ([place]) 09/24/2013  . Closed fracture of head of left humerus 09/09/2017  . Complication of anesthesia   . Essential hypertension 05/12/2009   Qualifier: Diagnosis of  By: Lamonte Sakai MD, Rose Fillers   . Gallstones   . GERD (gastroesophageal reflux disease)   . Glaucoma   . H/O atrial tachycardia 05/12/2009   Qualifier: History of  By: Lamonte Sakai MD, Rose Fillers   Overview:  Overview:  Qualifier: History of  By: Lamonte Sakai MD, Rose Fillers  Last Assessment & Plan:  There was no evidence of this on her cardiac monitor. I would recommend watchful waiting.   Marland Kitchen History of non-ST elevation myocardial infarction (NSTEMI)    Sept 2014--  thought to be type II HTN w/ LHC without infarct related artery and patent grafts  . History of radiation therapy 05/24/16-07/26/16   right breast 50.4 Gy in 28 fractions, right breast boost 10 Gy in 5 fractions  . Hyperlipidemia   . Hypertension    Resolved.  . Iron deficiency anemia   . Mania ([place]) 03/05/2016  . Moderate persistent asthma    pulmologist-  Dr. Malvin Johns  . Osteomyelitis of toe of left foot  ([place]) 04/06/2016  . Osteopenia of multiple sites 10/19/2015  . PAF (paroxysmal atrial fibrillation) ([place])   . Personal history of chemotherapy 2017  . Personal history of radiation therapy 2017  . PONV (postoperative nausea and vomiting)   . Port catheter in place 02/17/2016  . Psoriasis    right leg  . Renal calculus, right   . S/P AVR    prosthesis valve placement 2009 at same time re-do CABG  . Seizure-like activity ([place]) 09/01/2017  . Sensorineural hearing loss (SNHL) of both ears 01/06/2016  . Stroke [facility])    residual rt hearing loss  . Syncope 09/06/2017  . Type 2 diabetes mellitus ([place])    monitored by dr Dwyane Dee    Past Surgical History:  Procedure Laterality Date  . AORTIC VALVE REPLACEMENT  [facility]   [facility] is [facility]  . BREAST LUMPECTOMY Right 01/18/2016  .  BREAST LUMPECTOMY WITH RADIOACTIVE SEED AND SENTINEL LYMPH NODE BIOPSY Right 01/18/2016   Procedure: RIGHT BREAST LUMPECTOMY WITH RADIOACTIVE SEED AND SENTINEL LYMPH NODE BIOPSY;  Surgeon: Alphonsa Overall, MD;  Location: [place];  Service: General;  Laterality: Right;  . CARDIAC CATHETERIZATION  03/23/2008   Pre-redo CABG: L main OK, LAD (T), CFX (T), OM1 99%, RCA (T), LIMA-LAD OK, SVG-OM(?3) OK w/ little florw to OM2, SVG-RCA OK. EF NL  . CARPAL TUNNEL RELEASE    . CHOLECYSTECTOMY N/A 07/07/2018   Procedure: LAPAROSCOPIC CHOLECYSTECTOMY WITH INTRAOPERATIVE CHOLANGIOGRAM ERAS PATHWAY;  Surgeon: Alphonsa Overall, MD;  Location: [facility];  Service: General;  Laterality: N/A;  . COLONOSCOPY     around 2015. Possibly with [facility]   . CORONARY ARTERY BYPASS GRAFT  1998 &  re-do 2009   Had LIMA to DX/LAD, SVG to 2 marginal branches and SVG to [facility] originally; SVG to 3rd OM and PD at time of redo  . CYSTOSCOPY W/ URETERAL STENT PLACEMENT Right 12/20/2014   Procedure: CYSTOSCOPY WITH RETROGRADE PYELOGRAM/URETERAL STENT PLACEMENT;  Surgeon: Cleon Gustin, MD;  Location: [facility];   Service: Urology;  Laterality: Right;  . ESOPHAGOGASTRODUODENOSCOPY     many years ago per patient   . ESOPHAGOGASTRODUODENOSCOPY ENDOSCOPY  06/17/2018  . EYE SURGERY Bilateral    cataracts  . HOLMIUM LASER APPLICATION Right 9/62/8366   Procedure:  HOLMIUM LASER LITHOTRIPSY;  Surgeon: Cleon Gustin, MD;  Location[facility];  Service: Urology;  Laterality: Right;  . IR THORACENTESIS ASP PLEURAL SPACE W/IMG GUIDE  10/03/2018  . LEFT HEART CATHETERIZATION WITH CORONARY/GRAFT ANGIOGRAM N/A 02/23/2013   Procedure: LEFT HEART CATHETERIZATION WITH Beatrix Fetters;  Surgeon: Blane Ohara, MD;  Location: [facility] CATH LAB;  Service: Cardiovascular;  Laterality: N/A;  . LOOP RECORDER INSERTION N/A 08/30/2017   Procedure: LOOP RECORDER INSERTION;  Surgeon: Evans Lance, MD;  Location: [place] CV LAB;  Service: Cardiovascular;  Laterality: N/A;  . PORTACATH PLACEMENT Left 01/18/2016   Procedure: INSERTION PORT-A-CATH;  Surgeon: Alphonsa Overall, MD;  Location: [place];  Service: General;  Laterality: Left;  . portacath removal    . TONSILLECTOMY    . TRANSTHORACIC ECHOCARDIOGRAM  02-24-2013      mild LVH,  ef 50-55%/  AV bioprosthesis was present with very mild stenosis and no regurg., mean grandient 81mHg, peak grandient 217mg /  mild MR/  mild LAE and RAE/  moderate TR  . TUBAL LIGATION       Medications: Current Meds  Medication Sig  . acetaminophen (TYLENOL) 325 MG tablet Take 2 tablets (650 mg total) by mouth every 6 (six) hours as needed for mild pain (or Fever >/= 101).  . Marland Kitchenlbuterol (PROVENTIL HFA;VENTOLIN HFA) 108 (90 Base) MCG/ACT inhaler Inhale 2 puffs into the lungs every 6 (six) hours as needed for wheezing or shortness of breath.  . Marland Kitchenmiodarone (PACERONE) 200 MG tablet Take 1 tablet (200 mg total) by mouth daily.  . Marland Kitchenpixaban (ELIQUIS) 5 MG TABS tablet Take 1 tablet (5 mg total) by mouth 2 (two) times daily.  . Blood Glucose Monitoring Suppl (ACCU-CHEK GUIDE  ME) w/Device KIT Use accu chek meter to check blood sugar three times daily.  . budesonide-formoterol (SYMBICORT) 160-4.5 MCG/ACT inhaler INHALE TWO PUFFS INTO THE LUNGS TWICE DAILY  . doxycycline (DORYX) 100 MG EC tablet Take 1 tablet (100 mg total) by mouth 2 (two) times daily.  . Marland Kitchenpoetin alfa-epbx (RETACRIT) 4029476NIT/ML injection Inject 40,000 Units into  the skin every Friday.  Marland Kitchen exemestane (AROMASIN) 25 MG tablet TAKE ONE TABLET BY MOUTH DAILY AFTER BREAKFAST  . ezetimibe (ZETIA) 10 MG tablet TAKE ONE TABLET BY MOUTH EVERY DAY  . famotidine (PEPCID) 20 MG tablet Take 1 tablet (20 mg total) by mouth 2 (two) times daily. One after supper  . furosemide (LASIX) 20 MG tablet Take 20 mg every other day.  Marland Kitchen glucose blood (ACCU-CHEK GUIDE) test strip Use to test blood sugar 3 times daily  . HUMALOG KWIKPEN 100 UNIT/ML KwikPen INJECT SUBCUTANEOUSLY 4 TO  6 UNITS 3 TIMES DAILY  BEFORE MEALS  . insulin glargine (LANTUS) 100 UNIT/ML injection Inject 16 units under the skin once daily in the morning.  . insulin lispro (HUMALOG) 100 UNIT/ML injection Inject 4-6 units under the skin three times daily before meals.  . Insulin Pen Needle 32G X 4 MM MISC Use as instructed to inject insulin 4 times daily.  Marland Kitchen loratadine (CLARITIN) 10 MG tablet Take 10 mg by mouth daily as needed for allergies.   . midodrine (PROAMATINE) 2.5 MG tablet Take 1 tablet (2.5 mg total) by mouth 3 (three) times daily with meals.  . potassium chloride SA (K-DUR) 20 MEQ tablet Take 1 tablet (20 mEq total) by mouth daily.  . rosuvastatin (CRESTOR) 40 MG tablet Take 1 tablet (40 mg total) by mouth at bedtime.  Marland Kitchen zolpidem (AMBIEN) 5 MG tablet TAKE ONE TABLET BY MOUTH AT BEDTIME AS NEEDED FOR SLEEP  . [DISCONTINUED] famotidine (PEPCID) 20 MG tablet One after supper     Allergies: Allergies  Allergen Reactions  . Amoxicillin Rash and Other (See Comments)    Tolerates Cephalosporins Has patient had a PCN reaction causing immediate  rash, facial/tongue/throat swelling, SOB or lightheadedness with hypotension: Yes Has patient had a PCN reaction causing severe rash involving mucus membranes or skin necrosis: Yes Has patient had a PCN reaction that required hospitalization No Has patient had a PCN reaction occurring within the last 10 years: No If all of the above answers are "NO", then may proceed with Cephalosporin use.   . Tape Other (See Comments)    Must use paper tape  . Aldactone [Spironolactone] Other (See Comments)    CKD/hypokalemia  . Imdur [Isosorbide Dinitrate] Other (See Comments)    Headache/severe hypotension/Syncope  . Arimidex [Anastrozole] Nausea Only  . Latex Itching and Other (See Comments)    (Dentist office)  . Tetracycline Rash    Social History: The patient  reports that she has never smoked. She has never used smokeless tobacco. She reports that she does not drink alcohol or use drugs.   Family History: The patient's family history includes Diabetes in her maternal grandmother and son; Healthy in her brother; Heart attack in her brother; Heart disease in her brother, father, and maternal grandmother; Heart failure in her father.   Review of Systems: Please see the history of present illness.   All other systems are reviewed and negative.   Physical Exam: VS:  BP (!) 122/48   Pulse 65   Ht _0  (1.651 m)   Wt 153 lb 12.8 oz (69.8 kg)   SpO2 98%   BMI 25.59 kg/m  .  BMI Body mass index is 25.59 kg/m.  Wt Readings from Last 3 Encounters:  05/12/19 153 lb 12.8 oz (69.8 kg)  05/08/19 155 lb 12.8 oz (70.7 kg)  04/21/19 157 lb 12.8 oz (71.6 kg)    General: Pleasant. Well developed, well nourished and in no  acute distress.   HEENT: Normal. The knot on her head is slightly less in size. There is still considerably discoloration around it - basically black - not as tender.  Neck: Supple, no JVD, carotid bruits, or masses noted.  Cardiac: Regular rate and rhythm. Harsh outflow murmur.  Trace edema. She has her support stockings on.  Respiratory:  Lungs are clear to auscultation bilaterally with normal work of breathing.  GI: Soft and nontender.  MS: No deformity or atrophy. Gait and ROM intact.  Skin: Warm and dry. Color is normal.  Neuro:  Strength and sensation are intact and no gross focal deficits noted.  Psych: Alert, appropriate and with normal affect.   LABORATORY DATA:  EKG:  EKG is not ordered today.   Lab Results  Component Value Date   WBC 5.3 05/08/2019   HGB 8.4 (L) 05/08/2019   HCT 29.6 (L) 05/08/2019   PLT 121 (L) 05/08/2019   GLUCOSE 144 (H) 04/21/2019   CHOL 106 12/02/2018   TRIG 97 12/02/2018   HDL 34 (L) 12/02/2018   LDLDIRECT 53.0 10/08/2016   LDLCALC 53 12/02/2018   ALT 25 04/21/2019   AST 38 04/21/2019   NA 139 04/21/2019   K 4.8 04/21/2019   CL 104 04/21/2019   CREATININE 2.32 (H) 04/21/2019   BUN 32 (H) 04/21/2019   CO2 22 04/21/2019   TSH 3.040 04/21/2019   INR 1.9 (H) 03/04/2019   HGBA1C 5.6 02/09/2019   MICROALBUR 53.4 (H) 05/12/2018     BNP (last 3 results) Recent Labs    03/04/19 1429  BNP 273.4*    ProBNP (last 3 results) Recent Labs    10/28/18 1041 02/19/19 1126 02/23/19 1221  PROBNP 364.0* 2,996* 2,825*     Other Studies Reviewed Today:  CT CHEST IMPRESSION 04/2019: Moderate right pleural effusion, increased from prior CT. Associated right lower lobe atelectasis.  No evidence of pneumonia.  1.2 x 2.4 cm lesion along the inferior aspect of the right breast/chest wall. While additional findings noted above correlate with areas of fat necrosis, this lesion was not clearly identified when correlating with prior breast tomography report. Breast imaging consultation is suggested for further evaluation, including potential ultrasound with biopsy versus breast MR. These results will be called to the ordering clinician or representative by the Radiologist Assistant, and communication documented in the  PACS or zVision Dashboard.  Aortic Atherosclerosis (ICD10-I70.0).   Electronically Signed   By: Julian Hy M.D.   On: 05/06/2019 18:11   CT ABDOMENIMPRESSION10/2020: Chest Impression:  1. Mild RIGHT basilar atelectasis and small RIGHT effusion. 2. Increased skin thickening in the lateral RIGHT breast. Recommend clinical correlation. 3. Stable seroma in the RIGHT breast.  Abdomen / Pelvis Impression:  1. No acute findings in the abdomen pelvis. 2. Nonobstructing calculi in the LEFT renal pelvis. 3. Degenerative endplate change in the lumbar spine similar to comparison CT 2019. 4. Coronary artery calcification and Aortic Atherosclerosis (ICD10-I70.0).   Electronically Signed By: Suzy Bouchard M.D. On: 03/17/2019 09:59   CXRIMPRESSION9/04/2019: Small right effusion and right lower lobe airspace disease unchanged. Negative for edema.   Electronically Signed By: Franchot Gallo M.D. On: 02/20/2019 15:39   ABD [place] 02/17/2019 Other findings: There is a somewhat lobulated fluid collection identified along the inferior aspect of the liver. This measures 2.6 x 3.4 x 4.3 cm. This was not seen on the prior CT examination and may be related to a focal fluid collection from prior cholecystectomy. Mild perihepatic ascites is noted.  Note is made of right-sided pleural effusion.  IMPRESSION: Status post cholecystectomy. There is a lobulated cystic area as described above along the inferior right hepatic margin. This could be related to the prior surgery possibly representing a small seroma. CT may be helpful for further evaluation.  Small right pleural effusion.  Mild ascites.  Increased echogenicity within the liver likely related to fatty infiltration or underlying hepatocellular disease.  Electronically Signed By: Inez Catalina M.D. On: 02/17/2019 10:18  Breast Ultrasound 01/2019 IMPRESSION: 1. New irregular  hypoechoic mass along the posterior margin of the scar right breast 8 o'clock position. Findings are nonspecific however concerning for the possibility of localized recurrence. 2. Within the outer aspect of the right axilla there are two adjacent irregular hypoechoic masses just deep to the skin which are nonspecific. These may represent focal fat necrosis or potentially recurrent or metastatic disease.  RECOMMENDATION: 1. Ultrasound-guided core needle biopsy right breast mass 8 o'clock position just deep to the scar. 2. Ultrasound-guided core needle biopsy of both irregular hypoechoic masses within the outer aspect of the right axilla.   ECHOIMPRESSIONS5/2020  1. The left ventricle has normal systolic function with an ejection fraction of 60-65%. The cavity size was normal. There is mildly increased left ventricular wall thickness. Left ventricular diastolic Doppler parameters are consistent with  pseudonormalization. 2. The right ventricle has normal systolic function. The cavity was mildly enlarged. 3. Left atrial size was moderately dilated. 4. Right atrial size was mildly dilated. 5. The mitral valve is grossly normal. Mild thickening of the mitral valve leaflet. There is mild mitral annular calcification present. 6. The tricuspid valve is grossly normal. 7. There is mild dilatation of the ascending aorta measuring 40 mm. 8. Normal LV systolic function; moderate diastolic dysfunction; mild LVH; mildly dilated ascending aorta; s/p AVR with mean gradient of 18 mmHg and no AI; biatrial enlargement; mild MR; mild RVE.    MYOVIEW FINDINGS FROM 06/2013: Normal resting EKG. Slight ST depressions noted in aVL after administration of lexiscan. Mild shortness of breath and nausea resolved after the test. EKG is nondiagnostic for ischemia. TID ratio 1.05. Lung-heart ratio 0.43. Normal ventricular chamber size.  IMPRESSION: No evidence for ischemia. Normal wall  motion. LVEF 72%.  Electronically Signed By: Guy Sandifer   Cardiac Cath: 02/23/2013 Left mainstem: Normal  Left anterior descending (LAD): Severe proximal and mid calcification with long proximal 95% stenosis. The mid and distal vessel is small but free of high grade disease.  Left circumflex (LCx):AV groove has a mid 90% stenosis before a moderate sized MOM. OM1 and OM2 are occluded at the ostium and fill via the SVG. OM3 occluded at the ostium and fills via SVG. The grafted OMs are small and diffusely disease.  Right coronary artery (RCA): Occluded in the mid vessel. The PDA is moderate sized and occluded at the ostium. There is a 70% stenosis in the PDA after the insertion of the vein graft.  Grafts:  LIMA to LAD: Patent  SVG to RCA: Occluded (from original CABG)  SVG to RCA: Patent (from redo CABG). This is mild diffuse plaque within the vein graft.  SVG to OM3: Patent. This is mild diffuse plaque within the vein graft.  SVG sequential to OM1/OM2: Patent. There is ostial 50% stenosis. There is a patent proximal SVG stent. The native marginals are very small.  Left ventriculography: LV not injected. AVR not crossed.  Final Conclusions: Severe native vessel CAD. Patent grafts as described with nonobstructive disease in the grafts.  A stent placed into the sequential SVG is patent with some disease at the ostium that on several views in not occlusive. I did compare the 2009 cath with results today. There is high grade disease in the circumflex AV groove. However, this is not changed from 2009. However, this lesion does appear to lead into a non grafted OM. If she has further symptoms I would consider PCI of the native circumflex.     Assessment/Plan:  1.Fall/dizziness ? Orthostatic - with subsequent laceration to the back of her head - this is slowly decreasing in size - still rather black in appearance.  Not sure what else if anything to do for this - will need to recheck. ?  Dermatology.   2. Chronic diastolic HF -she is adjusting her Lasix. May require less diuretic after this upcoming thoracentesis - she will continue to watch her weight/BP.   3.Recurrent pleural effusion - she has thoracentesis planned for later this week. Still worrisome to me.   4.Abnormal Abd [place] -CT of the abdomenpreviouslynoted.Not discussed.   5.Profound anemia -last HGB noted - she is has been transfused/on IV iron.   6. CAD/aortic valve disease - remote CABG/AVR - managed medically -she does note some chest pain - she feels like this is indigestion - it is not exertional. Will increase the Pepcid.   7. CKD - stage IV -she has had recent visit with Dr. Posey Pronto.This was stable.Most recent lab noted.   8.History of orthostasis - reports negative adrenal work up - I see where she had an [place] from March 2019 - still not sure if we need to re-consider this going forward.Still may need to consider.She is using Midodrine TID. She does keep a good check on her BP for [place]. This was not discussed today.   9. PAF - in sinuson exam.   10.Carotid disease-noted 1 to 39% bilateral stenosis from study 10/2017 -will need repeat study 10/2019- not discussed today  11. Prior breast cancer - recent abnormal mammogram - s/p biopsies.Abnormality noted on the CT - she has had recent biopsies that were negative.This has been reviewed with Dr. Lindi Adie - she does note significant indentation/tenderness - she is going to discuss further with him on follow up.   12. Recent fall/dizziness with injury -see above.  65. COVID-19 Education: The signs and symptoms of COVID-19 were discussed with the patient and how to seek care for testing (follow up with PCP or arrange E-visit).  The importance of social distancing, staying at home, hand hygiene and wearing a mask when out in public were discussed today.  Current medicines are reviewed with the patient today.  The patient does not  have concerns regarding medicines other than what has been noted above.  The following changes have been made:  See above.  Labs/ tests ordered today include:   No orders of the defined types were placed in this encounter.    Disposition:   FU with me in a few weeks before Christmas.   Patient is agreeable to this plan and will call if any problems develop in the interim.   SignedTruitt Merle, NP  05/12/2019 2:33 PM  [place] [address] [place], [place]  [address] Phone: [phone] Fax: [phone]

## 2019-05-06 ENCOUNTER — Ambulatory Visit
Admission: RE | Admit: 2019-05-06 | Discharge: 2019-05-06 | Disposition: A | Discharge status: Home / Self Care | Payer: Medicare Other | Source: Ambulatory Visit | Attending: Nurse Practitioner | Admitting: Nurse Practitioner

## 2019-05-06 ENCOUNTER — Ambulatory Visit: Payer: Medicare Other

## 2019-05-06 ENCOUNTER — Other Ambulatory Visit: Payer: Medicare Other

## 2019-05-06 ENCOUNTER — Ambulatory Visit: Payer: Medicare Other | Admitting: Hematology and Oncology

## 2019-05-06 DIAGNOSIS — J90 Pleural effusion, not elsewhere classified: Secondary | ICD-10-CM

## 2019-05-07 NOTE — Progress Notes (Signed)
Patient Care Team: [name], DO as PCP - General [name], NP as PCP - Cardiology (Nurse Practitioner) Evans Lance, MD as PCP - Electrophysiology (Cardiology) Alphonsa Overall, MD as Consulting Physician (General Surgery) Nicholas Lose, MD as Consulting Physician (Hematology and Oncology) Gery Pray, MD as Consulting Physician (Radiation Oncology) Evans Lance, MD as Consulting Physician (Cardiology) Collene Gobble, MD as Consulting Physician (Pulmonary Disease) Elayne Snare, MD as Consulting Physician (Endocrinology) Delice Bison, Charlestine Massed, NP as Nurse Practitioner (Hematology and Oncology) Ardelle Balls., MD (Neurology) Izora Gala, MD as Consulting Physician (Otolaryngology) Trula Slade, DPM as Consulting Physician (Podiatry)  DIAGNOSIS:    ICD-10-CM   1. Malignant neoplasm of upper-inner quadrant of right breast in female, estrogen receptor positive ([place])  C50.211 US Thoracentesis Asp Pleural space w/IMG guide   Z17.0   2. Anemia of chronic disease  D63.8 US Thoracentesis Asp Pleural space w/IMG guide    SUMMARY OF ONCOLOGIC HISTORY: Oncology History  Breast cancer of upper-inner quadrant of right female breast ([place])  12/14/2015 Initial Diagnosis   Right breast biopsy 12:30 position: 2 masses, 1.6 cm mass: Invasive ductal carcinoma, grade 2, ER 0%, PR 0%, HER-2 negative, Ki-67 70%; satellite mass 8 mm: IDC grade 2 ER 5%, PR 5%, HER-2 negative, Ki-67 50%; T1cN0 stage IA clinical stage   01/18/2016 Surgery   Right lumpectomy: Multifocal IDC grade 3, 1.9 cm  ER 0%, PR 0%, HER-2 negative, Ki-67 70% and 0.8 cm satellite mass ER 5%, PR 2%, HER-2 negative, Ki-67 50%, high-grade DCIS, margins negative, 0/1 lymph nodes negative T1 cN0 stage IA   02/17/2016 - 04/06/2016 Chemotherapy   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 with above-mentioned history of right breast cancer treated with lumpectomy,adjuvant chemotherapy,radiation, and whois currently on antiestrogen therapywith exemestane.Shealsohas a history of anemia, andpreviouslyreceived PRBCs. She is currently receiving Retacritinjections. Chest CT on 05/06/19 showed an improving moderate right pleural effusion and a 2.4cm lesion in the right breast/chest wall. Shepresents to the clinic today for follow-up.   REVIEW OF SYSTEMS:   Constitutional: Denies fevers, chills or abnormal weight loss Eyes: Denies blurriness of vision Ears, nose, mouth, throat, and face: Denies mucositis or sore throat Respiratory: Denies cough, dyspnea or wheezes Cardiovascular: Denies palpitation, chest discomfort Gastrointestinal: Denies nausea, heartburn or change in bowel habits Skin: Denies abnormal skin rashes Lymphatics: Denies new lymphadenopathy or easy bruising Neurological: Denies numbness, tingling or new weaknesses Behavioral/Psych: Mood is stable, no new changes  Extremities: No lower extremity edema Breast: denies any pain or lumps or nodules in either breasts All other systems were reviewed with the patient and are negative.  I have reviewed the past medical history, past surgical history, social history and family history with the patient and they are unchanged from previous note.  ALLERGIES:  is allergic to amoxicillin; tape; aldactone [spironolactone]; imdur [isosorbide dinitrate]; arimidex [anastrozole]; latex;  and tetracycline.  MEDICATIONS:  Current  Outpatient Medications  Medication Sig Dispense Refill   acetaminophen (TYLENOL) 325 MG tablet Take 2 tablets (650 mg total) by mouth every 6 (six) hours as needed for mild pain (or Fever >/= 101).     albuterol (PROVENTIL HFA;VENTOLIN HFA) 108 (90 Base) MCG/ACT inhaler Inhale 2 puffs into the lungs every 6 (six) hours as needed for wheezing or shortness of breath.     amiodarone (PACERONE) 200 MG tablet Take 1 tablet (200 mg total) by mouth daily. 90 tablet 3   apixaban (ELIQUIS) 5 MG TABS tablet Take 1 tablet (5 mg total) by mouth 2 (two) times daily. 180 tablet 2   Blood Glucose Monitoring Suppl (ACCU-CHEK GUIDE ME) w/Device KIT Use accu chek meter to check blood sugar three times daily. 1 kit 0   budesonide-formoterol (SYMBICORT) 160-4.5 MCG/ACT inhaler INHALE TWO PUFFS INTO THE LUNGS TWICE DAILY 3 Inhaler 1   doxycycline (DORYX) 100 MG EC tablet Take 1 tablet (100 mg total) by mouth 2 (two) times daily. 14 tablet 0   epoetin alfa-epbx (RETACRIT) 44010 UNIT/ML injection Inject 40,000 Units into the skin every Friday.     exemestane (AROMASIN) 25 MG tablet TAKE ONE TABLET BY MOUTH DAILY AFTER BREAKFAST 90 tablet 3   ezetimibe (ZETIA) 10 MG tablet TAKE ONE TABLET BY MOUTH EVERY DAY 90 tablet 3   famotidine (PEPCID) 20 MG tablet One after supper 30 tablet 11   furosemide (LASIX) 20 MG tablet Take 20 mg every other day. 30 tablet 2   glucose blood (ACCU-CHEK GUIDE) test strip Use to test blood sugar 3 times daily 150 each 3   insulin glargine (LANTUS) 100 UNIT/ML injection Inject 16 units under the skin once daily in the morning. 20 mL 3   insulin lispro (HUMALOG) 100 UNIT/ML injection Inject 4-6 units under the skin three times daily before meals. 20 mL 3   Insulin Pen Needle 32G X 4 MM MISC Use as instructed to inject insulin 4 times daily. 400 each 3   loratadine (CLARITIN) 10 MG tablet Take 10 mg by mouth daily as needed for  allergies.      metoprolol tartrate (LOPRESSOR) 25 MG tablet Take 1 tablet (25 mg total) by mouth 2 (two) times daily. 180 tablet 3   midodrine (PROAMATINE) 2.5 MG tablet Take 1 tablet (2.5 mg total) by mouth 3 (three) times daily with meals. 60 tablet 3   nitroGLYCERIN (NITROSTAT) 0.4 MG SL tablet Place 1 tablet (0.4 mg total) under the tongue every 5 (five) minutes as needed for chest pain. 30 tablet 3   potassium chloride SA (K-DUR) 20 MEQ tablet Take 1 tablet (20 mEq total) by mouth daily. 90 tablet 3   rosuvastatin (CRESTOR) 40 MG tablet Take 1 tablet (40 mg total) by mouth at bedtime. 90 tablet 3   zolpidem (AMBIEN) 5 MG tablet TAKE ONE TABLET BY MOUTH AT BEDTIME AS NEEDED FOR SLEEP 14 tablet 0   No current facility-administered medications for this visit.     PHYSICAL EXAMINATION: ECOG PERFORMANCE STATUS: 2 - Symptomatic, <50% confined to bed  Vitals:   05/08/19 0822  BP: (!) 110/38  Pulse: (!) 59  Resp: 16  Temp: 98.2 F (36.8 C)  SpO2: 100%   Filed Weights   05/08/19 0822  Weight: 155 lb 12.8 oz (70.7 kg)    GENERAL: alert, no distress and comfortable SKIN: skin color, texture, turgor are normal, no rashes or significant lesions EYES: normal, Conjunctiva are pink and non-injected, sclera clear OROPHARYNX:  no exudate, no erythema and lips, buccal mucosa, and tongue normal  NECK: supple, thyroid normal size, non-tender, without nodularity LYMPH: no palpable lymphadenopathy in the cervical, axillary or inguinal LUNGS: clear to auscultation and percussion with normal breathing effort HEART: regular rate & rhythm and no murmurs and no lower extremity edema ABDOMEN: abdomen soft, non-tender and normal bowel sounds MUSCULOSKELETAL: no cyanosis of digits and no clubbing  NEURO: alert & oriented x 3 with fluent speech, no focal motor/sensory deficits EXTREMITIES: No lower extremity edema  LABORATORY DATA:  I have reviewed the data as listed CMP Latest Ref Rng & Units  04/21/2019 03/31/2019 03/04/2019  Glucose 65 - 99 mg/dL 144(H) 182(H) 155(H)  BUN 8 - 27 mg/dL 32(H) 39(H) 35(H)  Creatinine 0.57 - 1.00 mg/dL 2.32(H) 2.46(H) 2.32(H)  Sodium 134 - 144 mmol/L 139 139 136  Potassium 3.5 - 5.2 mmol/L 4.8 4.8 4.4  Chloride 96 - 106 mmol/L 104 102 100  CO2 20 - 29 mmol/L _0 Calcium 8.7 - 10.3 mg/dL 8.8 8.8 8.8(L)  Total Protein 6.0 - 8.5 g/dL 6.1 6.2 6.1(L)  Total Bilirubin 0.0 - 1.2 mg/dL 0.6 0.6 0.8  Alkaline Phos 39 - 117 IU/L 60 81 65  AST 0 - 40 IU/L 38 36 49(H)  ALT 0 - 32 IU/L _1 Lab Results  Component Value Date   WBC 5.3 05/08/2019   HGB 8.4 (L) 05/08/2019   HCT 29.6 (L) 05/08/2019   MCV 92.8 05/08/2019   PLT 121 (L) 05/08/2019   NEUTROABS 3.8 05/08/2019    ASSESSMENT & PLAN:  Anemia of chronic disease Patient has had longstanding anemia where the hemoglobin was between 10 to 12 g. Since April 2020 her hemoglobin has gotten worse and today it is at 6.6. During the same timeframe her creatinine got from 2.6-2.03 T59 folic acid and iron studies were normal. 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  Current treatment:Retacritinjectionsweeklystarted7/31/2020 Bone marrow biopsy 04/08/2019: Normocellular bone marrow with trilineage hematopoiesis, absent iron stores, flow cytometry no abnormal B or T-cell clonal abnormalities noted.  FISH panel for MDS pending.  Recommendation: IV iron therapy Venofer weekly x4 started 11/13 Lab review: Hemoglobin is 8.4  CT chest: Moderate pleural effusion, right breast changes After extensive discussion we will check with radiology if there is enough fluid to be removed. Right breast abnormalities: This was previously biopsy-proven to be fat necrosis.  I do not recommend any further immediate imaging.  Patient is in agreement.   Return to clinic weekly for labs and IV iron.     Orders Placed This Encounter  Procedures    US Thoracentesis Asp Pleural space w/IMG guide    Patient is on blood thinners.    Standing Status:   Future    Standing Expiration Date:   05/07/2020    Order Specific Question:   Are labs required for specimen collection?    Answer:   No    Order Specific Question:   Reason for Exam (SYMPTOM  OR DIAGNOSIS REQUIRED)    Answer:   Shortness of breath due to pleural effusion    Order Specific Question:   Preferred imaging location?    Answer:   [facility]   The patient has a good understanding of the overall plan. she agrees with it. she will call with any problems that may develop before the next visit here.  Nicholas Lose, MD 05/08/2019  I,  Molly Dorshimer, am acting as scribe for Dr. Nicholas Lose.  I have reviewed the above documentation for accuracy and completeness, and I agree with the above.

## 2019-05-08 ENCOUNTER — Inpatient Hospital Stay: Payer: Medicare Other

## 2019-05-08 ENCOUNTER — Inpatient Hospital Stay (HOSPITAL_BASED_OUTPATIENT_CLINIC_OR_DEPARTMENT_OTHER): Payer: Medicare Other | Admitting: Hematology and Oncology

## 2019-05-08 ENCOUNTER — Other Ambulatory Visit: Payer: Self-pay

## 2019-05-08 VITALS — BP 140/47 | HR 54 | Resp 16

## 2019-05-08 DIAGNOSIS — C50211 Malignant neoplasm of upper-inner quadrant of right female breast: Secondary | ICD-10-CM

## 2019-05-08 DIAGNOSIS — Z170 Estrogen receptor positive status [ER+]: Secondary | ICD-10-CM

## 2019-05-08 DIAGNOSIS — D638 Anemia in other chronic diseases classified elsewhere: Secondary | ICD-10-CM

## 2019-05-08 LAB — CBC WITH DIFFERENTIAL (CANCER CENTER ONLY)
Abs Immature Granulocytes: 0.01 10*3/uL (ref 0.00–0.07)
Basophils Absolute: 0 10*3/uL (ref 0.0–0.1)
Basophils Relative: 1 %
Eosinophils Absolute: 0.3 10*3/uL (ref 0.0–0.5)
Eosinophils Relative: 5 %
HCT: 29.6 % — ABNORMAL LOW (ref 36.0–46.0)
Hemoglobin: 8.4 g/dL — ABNORMAL LOW (ref 12.0–15.0)
Immature Granulocytes: 0 %
Lymphocytes Relative: 13 %
Lymphs Abs: 0.7 10*3/uL (ref 0.7–4.0)
MCH: 26.3 pg (ref 26.0–34.0)
MCHC: 28.4 g/dL — ABNORMAL LOW (ref 30.0–36.0)
MCV: 92.8 fL (ref 80.0–100.0)
Monocytes Absolute: 0.5 10*3/uL (ref 0.1–1.0)
Monocytes Relative: 9 %
Neutro Abs: 3.8 10*3/uL (ref 1.7–7.7)
Neutrophils Relative %: 72 %
Platelet Count: 121 10*3/uL — ABNORMAL LOW (ref 150–400)
RBC: 3.19 MIL/uL — ABNORMAL LOW (ref 3.87–5.11)
RDW: 17.9 % — ABNORMAL HIGH (ref 11.5–15.5)
WBC Count: 5.3 10*3/uL (ref 4.0–10.5)
nRBC: 0 % (ref 0.0–0.2)

## 2019-05-08 LAB — SAMPLE TO BLOOD BANK

## 2019-05-08 MED ORDER — SODIUM CHLORIDE 0.9 % IV SOLN
200.0000 mg | Freq: Once | INTRAVENOUS | Status: AC
  Administered 2019-05-08: 200 mg via INTRAVENOUS
  Filled 2019-05-08: qty 10

## 2019-05-08 MED ORDER — ACETAMINOPHEN 325 MG PO TABS
650.0000 mg | ORAL_TABLET | Freq: Once | ORAL | Status: AC
  Administered 2019-05-08: 650 mg via ORAL

## 2019-05-08 MED ORDER — SODIUM CHLORIDE 0.9 % IV SOLN
Freq: Once | INTRAVENOUS | Status: AC
  Administered 2019-05-08: 09:00:00 via INTRAVENOUS
  Filled 2019-05-08: qty 250

## 2019-05-08 MED ORDER — ACETAMINOPHEN 325 MG PO TABS
ORAL_TABLET | ORAL | Status: AC
  Filled 2019-05-08: qty 2

## 2019-05-08 NOTE — Assessment & Plan Note (Deleted)
Patient has had longstanding anemia where the hemoglobin was between 10 to 12 g. Since April 2020 her hemoglobin has gotten worse and today it is at 6.6. During the same timeframe her creatinine got from 1.5-2.44 B12 folic acid and iron studies were normal. 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  Current treatment:Retacritinjectionsweeklystarted7/31/2020 Bone marrow biopsy 04/08/2019: Normocellular bone marrow with trilineage hematopoiesis, absent iron stores, flow cytometry no abnormal B or T-cell clonal abnormalities noted.  FISH panel for MDS pending.  Recommendation: IV iron therapy Venofer weekly x4 started 11/13 Lab review:  Return to clinic weekly for labs and IV iron.  

## 2019-05-08 NOTE — Assessment & Plan Note (Signed)
Patient has had longstanding anemia where the hemoglobin was between 10 to 12 g. Since April 2020 her hemoglobin has gotten worse and today it is at 6.6. During the same timeframe her creatinine got from 1.5-2.44 B12 folic acid and iron studies were normal. 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  Current treatment:Retacritinjectionsweeklystarted7/31/2020 Bone marrow biopsy 04/08/2019: Normocellular bone marrow with trilineage hematopoiesis, absent iron stores, flow cytometry no abnormal B or T-cell clonal abnormalities noted.  FISH panel for MDS pending.  Recommendation: IV iron therapy Venofer weekly x4 started 11/13 Lab review:  Return to clinic weekly for labs and IV iron.  

## 2019-05-08 NOTE — Patient Instructions (Signed)

## 2019-05-09 ENCOUNTER — Other Ambulatory Visit: Payer: Self-pay | Admitting: Endocrinology

## 2019-05-11 ENCOUNTER — Telehealth: Payer: Self-pay | Admitting: Nurse Practitioner

## 2019-05-11 NOTE — Telephone Encounter (Signed)
New Message    Opal Sidles is calling from [facility] and would like to speak with a nurse regarding results

## 2019-05-11 NOTE — Telephone Encounter (Signed)
Spoke with [facility] who wanted to confirm that the pts CT from 11/25 was in the patients chart. I confirmed that the results was under result review.

## 2019-05-12 ENCOUNTER — Other Ambulatory Visit: Payer: Self-pay

## 2019-05-12 ENCOUNTER — Other Ambulatory Visit (HOSPITAL_COMMUNITY)
Admission: RE | Admit: 2019-05-12 | Discharge: 2019-05-12 | Disposition: A | Discharge status: Home / Self Care | Payer: Medicare Other | Source: Ambulatory Visit | Attending: Hematology and Oncology | Admitting: Hematology and Oncology

## 2019-05-12 ENCOUNTER — Ambulatory Visit (INDEPENDENT_AMBULATORY_CARE_PROVIDER_SITE_OTHER): Payer: Medicare Other | Admitting: Nurse Practitioner

## 2019-05-12 ENCOUNTER — Encounter: Payer: Self-pay | Admitting: Nurse Practitioner

## 2019-05-12 VITALS — BP 122/48 | HR 65 | Ht 65.0 in | Wt 153.8 lb

## 2019-05-12 DIAGNOSIS — J90 Pleural effusion, not elsewhere classified: Secondary | ICD-10-CM

## 2019-05-12 DIAGNOSIS — R0600 Dyspnea, unspecified: Secondary | ICD-10-CM

## 2019-05-12 DIAGNOSIS — I2510 Atherosclerotic heart disease of native coronary artery without angina pectoris: Secondary | ICD-10-CM | POA: Diagnosis not present

## 2019-05-12 DIAGNOSIS — N184 Chronic kidney disease, stage 4 (severe): Secondary | ICD-10-CM

## 2019-05-12 DIAGNOSIS — I428 Other cardiomyopathies: Secondary | ICD-10-CM | POA: Diagnosis not present

## 2019-05-12 DIAGNOSIS — Z01812 Encounter for preprocedural laboratory examination: Secondary | ICD-10-CM | POA: Insufficient documentation

## 2019-05-12 DIAGNOSIS — R0609 Other forms of dyspnea: Secondary | ICD-10-CM

## 2019-05-12 DIAGNOSIS — Z20828 Contact with and (suspected) exposure to other viral communicable diseases: Secondary | ICD-10-CM | POA: Diagnosis not present

## 2019-05-12 DIAGNOSIS — I5032 Chronic diastolic (congestive) heart failure: Secondary | ICD-10-CM

## 2019-05-12 HISTORY — DX: Pleural effusion, not elsewhere classified: J90

## 2019-05-12 MED ORDER — FAMOTIDINE 20 MG PO TABS: 20.0000 mg | ORAL_TABLET | Freq: Two times a day (BID) | ORAL | 11 refills | Status: DC

## 2019-05-12 NOTE — Patient Instructions (Addendum)
After Visit Summary:  We will be checking the following labs today - NONE   Medication Instructions:    Continue with your current medicines. BUT  Ok to increase the Pepcid to twice a day   If you need a refill on your cardiac medications before your next appointment, please call your pharmacy.     Testing/Procedures To Be Arranged:  N/A  Follow-Up:   See me in about 3 to 4 weeks.     At [facility], you and your health needs are our priority.  As part of our continuing mission to provide you with exceptional heart care, we have created designated Provider Care Teams.  These Care Teams include your primary Cardiologist (physician) and Advanced Practice Providers (APPs -  Physician Assistants and Nurse Practitioners) who all work together to provide you with the care you need, when you need it.  Special Instructions:  . Stay safe, stay home, wash your hands for at least 20 seconds and wear a mask when out in public.  . It was good to talk with you today.    Call the [place] office at [phone] if you have any questions, problems or concerns.

## 2019-05-13 LAB — NOVEL CORONAVIRUS, NAA (HOSP ORDER, SEND-OUT TO REF LAB; TAT 18-24 HRS): SARS-CoV-2, NAA: NOT DETECTED

## 2019-05-15 ENCOUNTER — Other Ambulatory Visit: Payer: Medicare Other

## 2019-05-15 ENCOUNTER — Inpatient Hospital Stay: Payer: Medicare Other | Attending: Hematology and Oncology

## 2019-05-15 ENCOUNTER — Ambulatory Visit (HOSPITAL_COMMUNITY)
Admission: RE | Admit: 2019-05-15 | Discharge: 2019-05-15 | Disposition: A | Discharge status: Home / Self Care | Payer: Medicare Other | Source: Ambulatory Visit | Attending: Hematology and Oncology | Admitting: Hematology and Oncology

## 2019-05-15 ENCOUNTER — Other Ambulatory Visit: Payer: Self-pay

## 2019-05-15 ENCOUNTER — Ambulatory Visit (HOSPITAL_COMMUNITY)
Admission: RE | Admit: 2019-05-15 | Discharge: 2019-05-15 | Disposition: A | Discharge status: Home / Self Care | Payer: Medicare Other | Source: Ambulatory Visit | Attending: Radiology | Admitting: Radiology

## 2019-05-15 VITALS — BP 142/46 | HR 62 | Temp 98.5°F

## 2019-05-15 DIAGNOSIS — J90 Pleural effusion, not elsewhere classified: Secondary | ICD-10-CM | POA: Insufficient documentation

## 2019-05-15 DIAGNOSIS — N189 Chronic kidney disease, unspecified: Secondary | ICD-10-CM | POA: Diagnosis not present

## 2019-05-15 DIAGNOSIS — C50211 Malignant neoplasm of upper-inner quadrant of right female breast: Secondary | ICD-10-CM | POA: Diagnosis not present

## 2019-05-15 DIAGNOSIS — Z794 Long term (current) use of insulin: Secondary | ICD-10-CM | POA: Diagnosis not present

## 2019-05-15 DIAGNOSIS — Z170 Estrogen receptor positive status [ER+]: Secondary | ICD-10-CM | POA: Insufficient documentation

## 2019-05-15 DIAGNOSIS — Z7901 Long term (current) use of anticoagulants: Secondary | ICD-10-CM | POA: Diagnosis not present

## 2019-05-15 DIAGNOSIS — I509 Heart failure, unspecified: Secondary | ICD-10-CM | POA: Diagnosis not present

## 2019-05-15 DIAGNOSIS — Z79899 Other long term (current) drug therapy: Secondary | ICD-10-CM | POA: Insufficient documentation

## 2019-05-15 DIAGNOSIS — D638 Anemia in other chronic diseases classified elsewhere: Secondary | ICD-10-CM | POA: Insufficient documentation

## 2019-05-15 DIAGNOSIS — Z9889 Other specified postprocedural states: Secondary | ICD-10-CM

## 2019-05-15 DIAGNOSIS — N184 Chronic kidney disease, stage 4 (severe): Secondary | ICD-10-CM | POA: Insufficient documentation

## 2019-05-15 LAB — LACTATE DEHYDROGENASE, PLEURAL OR PERITONEAL FLUID: LD, Fluid: 56 U/L — ABNORMAL HIGH (ref 3–23)

## 2019-05-15 LAB — GRAM STAIN

## 2019-05-15 LAB — GLUCOSE, PLEURAL OR PERITONEAL FLUID: Glucose, Fluid: 165 mg/dL

## 2019-05-15 LAB — PROTEIN, PLEURAL OR PERITONEAL FLUID: Total protein, fluid: <3 g/dL

## 2019-05-15 MED ORDER — SODIUM CHLORIDE 0.9 % IV SOLN
200.0000 mg | Freq: Once | INTRAVENOUS | Status: AC
  Administered 2019-05-15: 200 mg via INTRAVENOUS
  Filled 2019-05-15: qty 10

## 2019-05-15 MED ORDER — ACETAMINOPHEN 325 MG PO TABS
650.0000 mg | ORAL_TABLET | Freq: Once | ORAL | Status: AC
  Administered 2019-05-15: 650 mg via ORAL

## 2019-05-15 MED ORDER — LIDOCAINE HCL 1 % IJ SOLN
INTRAMUSCULAR | Status: AC
  Filled 2019-05-15: qty 20

## 2019-05-15 MED ORDER — SODIUM CHLORIDE 0.9 % IV SOLN
Freq: Once | INTRAVENOUS | Status: AC
  Administered 2019-05-15: 14:00:00 via INTRAVENOUS
  Filled 2019-05-15: qty 250

## 2019-05-15 MED ORDER — ACETAMINOPHEN 325 MG PO TABS
ORAL_TABLET | ORAL | Status: AC
  Filled 2019-05-15: qty 2

## 2019-05-15 NOTE — Patient Instructions (Signed)

## 2019-05-15 NOTE — Procedures (Signed)
Ultrasound-guided diagnostic and therapeutic right thoracentesis performed yielding 600 cc of yellow fluid. No immediate complications. Follow-up chest x-ray pending.The fluid was sent to the lab for preordered studies. EBL < 1 cc.     ? ?

## 2019-05-19 ENCOUNTER — Ambulatory Visit: Payer: Medicare Other | Admitting: Endocrinology

## 2019-05-19 ENCOUNTER — Other Ambulatory Visit: Payer: Self-pay

## 2019-05-19 ENCOUNTER — Ambulatory Visit (INDEPENDENT_AMBULATORY_CARE_PROVIDER_SITE_OTHER): Payer: Medicare Other | Admitting: *Deleted

## 2019-05-19 DIAGNOSIS — R55 Syncope and collapse: Secondary | ICD-10-CM

## 2019-05-20 ENCOUNTER — Other Ambulatory Visit: Payer: Self-pay | Admitting: Nurse Practitioner

## 2019-05-20 LAB — CUP PACEART REMOTE DEVICE CHECK
Date Time Interrogation Session: 20201208190952
Implantable Pulse Generator Implant Date: 20190322

## 2019-05-20 LAB — CULTURE, BODY FLUID W GRAM STAIN -BOTTLE: Culture: NO GROWTH

## 2019-05-20 IMAGING — CT CT HEAD W/O CM
3 series · 15 of 47 positions shown, 18 images · non-contrast
Comparison: Brain MRI 06/29/2016

CLINICAL DATA: Seizure. History of breast cancer and radiation
therapy.

EXAM:
CT HEAD WITHOUT CONTRAST
TECHNIQUE: Contiguous axial images were obtained from the base of the skull
through the vertex without intravenous contrast.

[Series 3: head 5.0 h30s · axial · 0.44mm/px · z∈[-88,+42]mm · 9 of 32 slices shown, 12 images]
[im 3/32  brain]
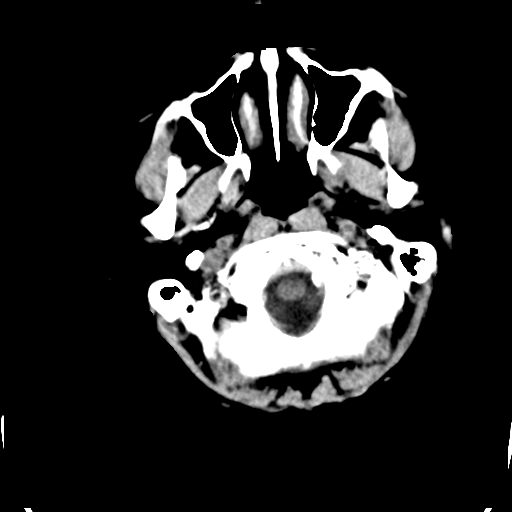
[im 3/32  bone]
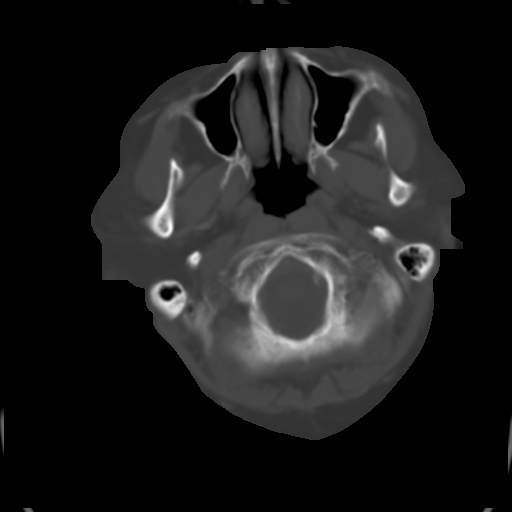
[im 6/32  brain]
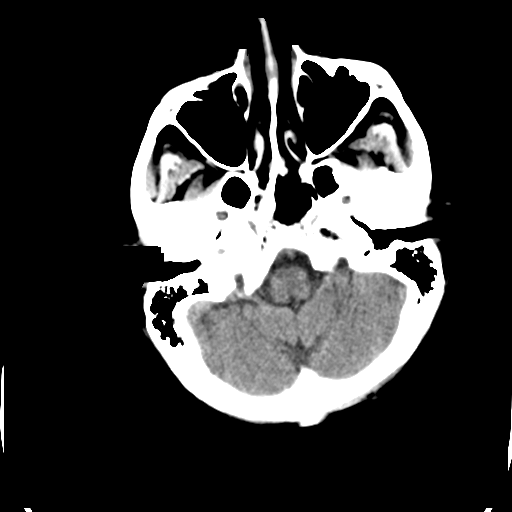
[im 9/32  brain]
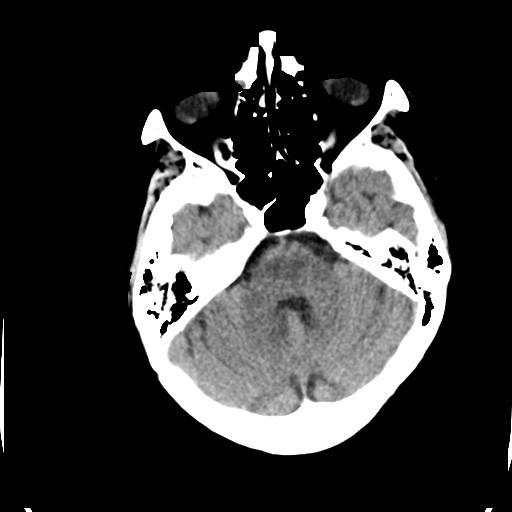
[im 12/32  brain]
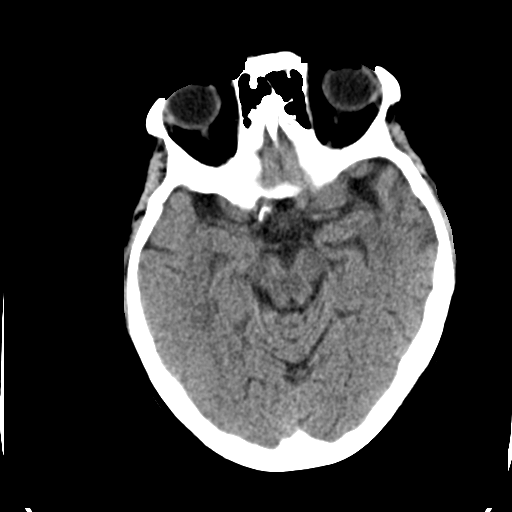
[im 17/32  brain]
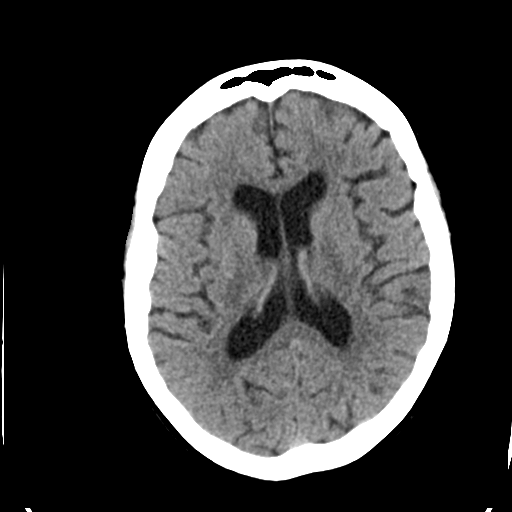
[im 17/32  bone]
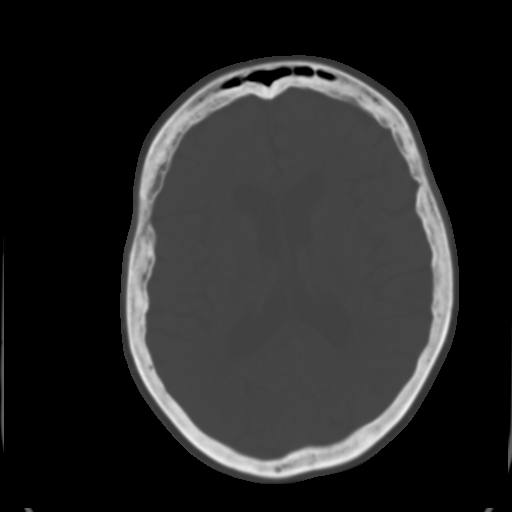
[im 20/32  brain]
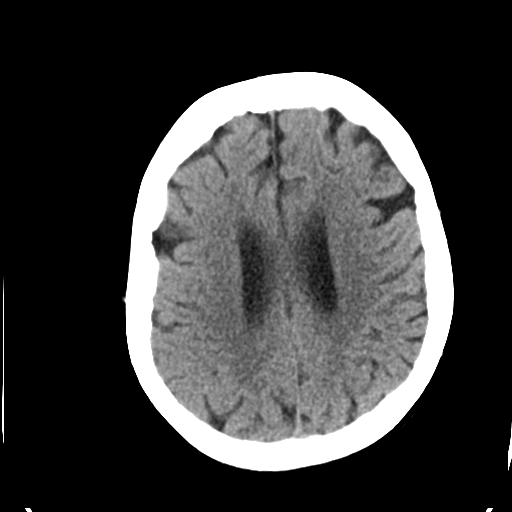
[im 23/32  brain]
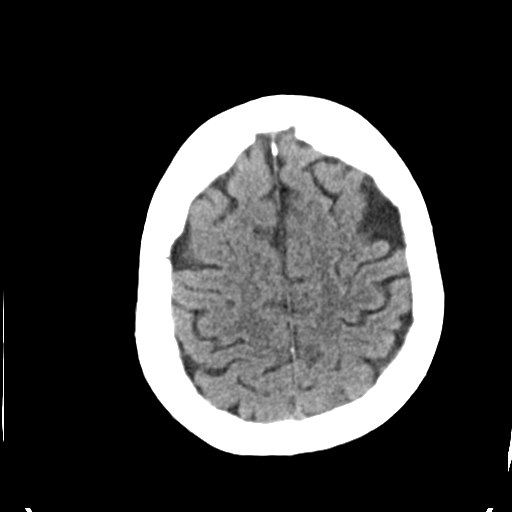
[im 26/32  brain]
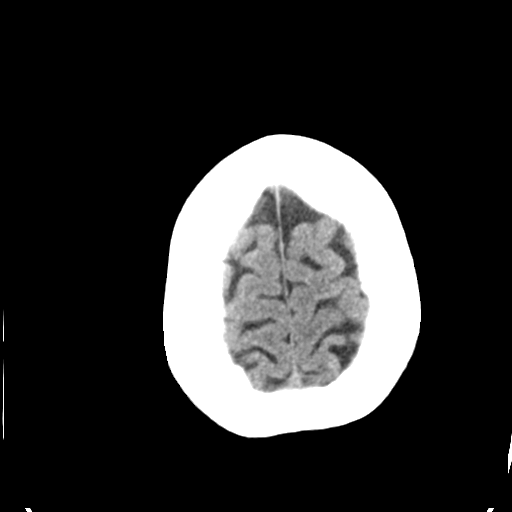
[im 29/32  brain]
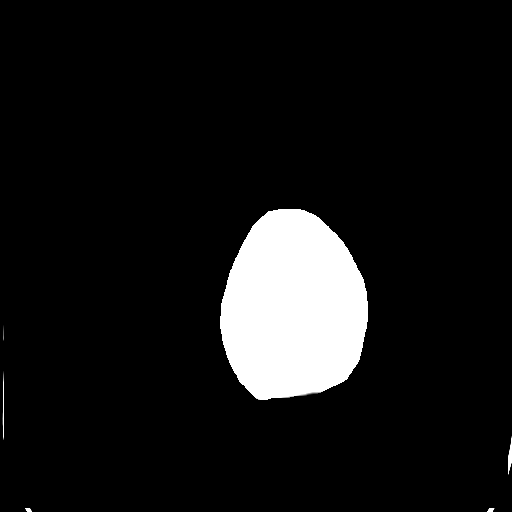
[im 29/32  bone]
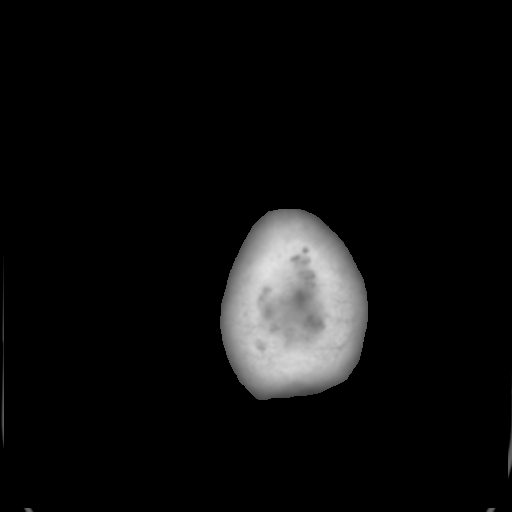

[Series 5: head 3.0 mpr cor · coronal · 0.30mm/px · 3 of 68 slices shown]
[im 23/68  brain]
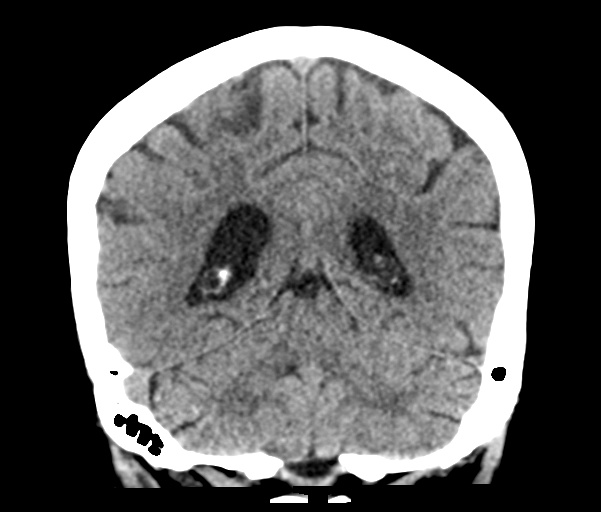
[im 30/68  brain]
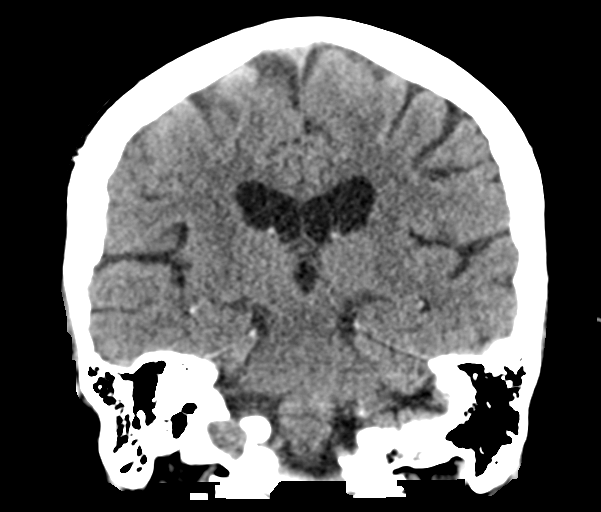
[im 38/68  brain]
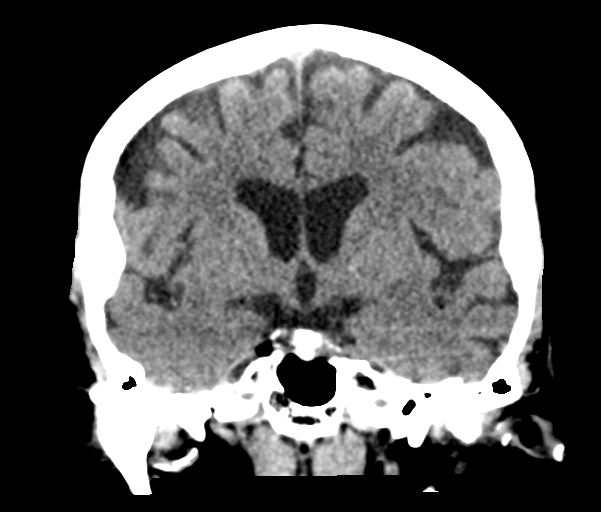

[Series 6: head 3.0 mpr sag · sagittal · 0.33mm/px · 3 of 58 slices shown]
[im 20/58  brain]
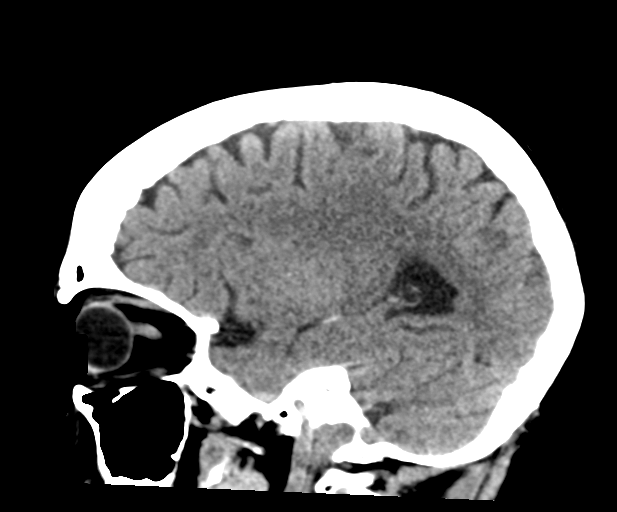
[im 29/58  brain]
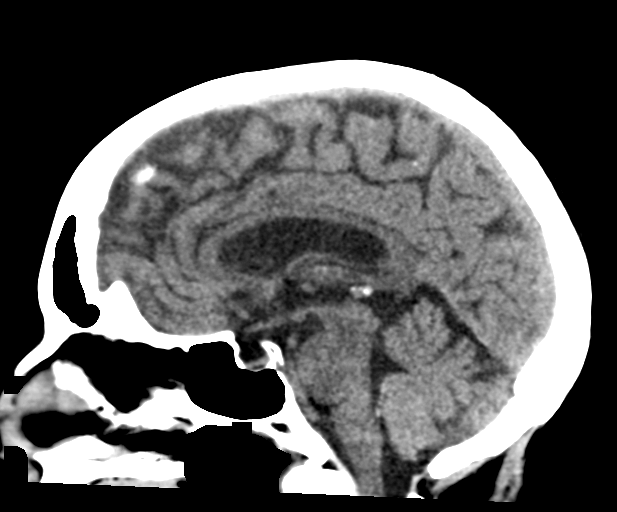
[im 39/58  brain]
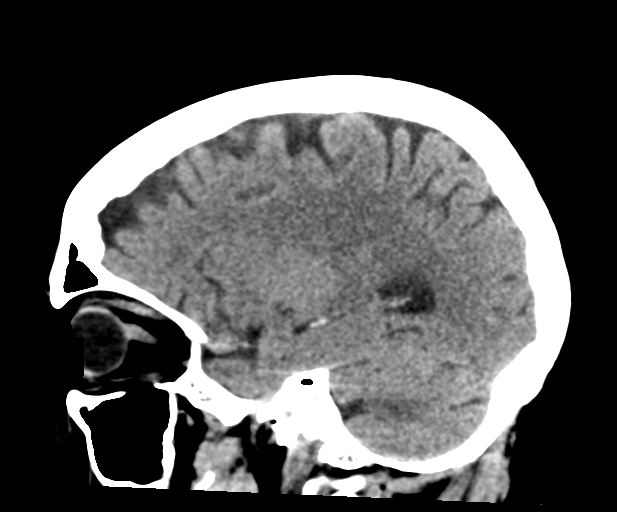

[15 of 47 positions shown; findings below may reference images not displayed]

FINDINGS: Brain: No mass lesion, intraparenchymal hemorrhage or extra-axial
collection. No evidence of acute cortical infarct. Normal appearance
of the brain parenchyma and extra axial spaces for age.

Vascular: No hyperdense vessel or unexpected vascular calcification.

Skull: Normal visualized skull base, calvarium and extracranial soft
tissues.

Sinuses/Orbits: No sinus fluid levels or advanced mucosal
thickening. No mastoid effusion. Normal orbits.
IMPRESSION: Normal brain.

## 2019-05-20 NOTE — Progress Notes (Signed)
Patient Care Team: [name], DO as PCP - General [name], NP as PCP - Cardiology (Nurse Practitioner) Evans Lance, MD as PCP - Electrophysiology (Cardiology) Alphonsa Overall, MD as Consulting Physician (General Surgery) Nicholas Lose, MD as Consulting Physician (Hematology and Oncology) Gery Pray, MD as Consulting Physician (Radiation Oncology) Evans Lance, MD as Consulting Physician (Cardiology) Collene Gobble, MD as Consulting Physician (Pulmonary Disease) Elayne Snare, MD as Consulting Physician (Endocrinology) Delice Bison, Charlestine Massed, NP as Nurse Practitioner (Hematology and Oncology) Ardelle Balls., MD (Neurology) Izora Gala, MD as Consulting Physician (Otolaryngology) Trula Slade, DPM as Consulting Physician (Podiatry)  DIAGNOSIS:    ICD-10-CM   1. Malignant neoplasm of upper-inner quadrant of right breast in female, estrogen receptor positive ([place])  C50.211    Z17.0     SUMMARY OF ONCOLOGIC HISTORY: Oncology History  Breast cancer of upper-inner quadrant of right female breast ([place])  12/14/2015 Initial Diagnosis   Right breast biopsy 12:30 position: 2 masses, 1.6 cm mass: Invasive ductal carcinoma, grade 2, ER 0%, PR 0%, HER-2 negative, Ki-67 70%; satellite mass 8 mm: IDC grade 2 ER 5%, PR 5%, HER-2 negative, Ki-67 50%; T1cN0 stage IA clinical stage   01/18/2016 Surgery   Right lumpectomy: Multifocal IDC grade 3, 1.9 cm  ER 0%, PR 0%, HER-2 negative, Ki-67 70% and 0.8 cm satellite mass ER 5%, PR 2%, HER-2 negative, Ki-67 50%, high-grade DCIS, margins negative, 0/1 lymph nodes negative T1 cN0 stage IA   02/17/2016 - 04/06/2016 Chemotherapy   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 with above-mentioned history of right breast cancer treated with lumpectomy,adjuvant chemotherapy,radiation, and whois currently on antiestrogen therapywith exemestane.Shealsohas a history of anemia and is currently receiving Retacritinjections and IV iron. She underwent a thoracentesis on 05/15/19, which removed 600cc of fluid. Shepresents to the clinic today forfollow-up. She is in a wheelchair today.  She feels extremely tired.   REVIEW OF SYSTEMS:   Constitutional: Denies fevers, chills or abnormal weight loss Eyes: Denies blurriness of vision Ears, nose, mouth, throat, and face: Denies mucositis or sore throat Respiratory: Denies cough, dyspnea or wheezes Cardiovascular: Denies palpitation, chest discomfort Gastrointestinal: Denies nausea, heartburn or change in bowel habits Skin: Denies abnormal skin rashes Lymphatics: Denies new lymphadenopathy or easy bruising Neurological: Denies numbness, tingling or new weaknesses Behavioral/Psych: Mood is stable, no new changes  Extremities: No lower extremity edema Breast: denies any pain or lumps or nodules in either breasts All other systems were reviewed with the patient and are negative.  I have reviewed the past medical history, past surgical history, social history and family history with the patient and they are unchanged from previous note.  ALLERGIES:  is allergic to amoxicillin; tape; aldactone [spironolactone]; imdur [isosorbide dinitrate]; arimidex [anastrozole]; latex; and tetracycline.  MEDICATIONS:  Current Outpatient Medications  Medication Sig Dispense Refill  . acetaminophen (TYLENOL) 325 MG tablet  Take 2 tablets (650 mg  total) by mouth every 6 (six) hours as needed for mild pain (or Fever >/= 101).    Marland Kitchen albuterol (PROVENTIL HFA;VENTOLIN HFA) 108 (90 Base) MCG/ACT inhaler Inhale 2 puffs into the lungs every 6 (six) hours as needed for wheezing or shortness of breath.    Marland Kitchen amiodarone (PACERONE) 200 MG tablet Take 1 tablet (200 mg total) by mouth daily. 90 tablet 3  . apixaban (ELIQUIS) 5 MG TABS tablet Take 1 tablet (5 mg total) by mouth 2 (two) times daily. 180 tablet 2  . Blood Glucose Monitoring Suppl (ACCU-CHEK GUIDE ME) w/Device KIT Use accu chek meter to check blood sugar three times daily. 1 kit 0  . budesonide-formoterol (SYMBICORT) 160-4.5 MCG/ACT inhaler INHALE TWO PUFFS INTO THE LUNGS TWICE DAILY 3 Inhaler 1  . doxycycline (DORYX) 100 MG EC tablet Take 1 tablet (100 mg total) by mouth 2 (two) times daily. 14 tablet 0  . epoetin alfa-epbx (RETACRIT) 14481 UNIT/ML injection Inject 40,000 Units into the skin every Friday.    Marland Kitchen exemestane (AROMASIN) 25 MG tablet TAKE ONE TABLET BY MOUTH DAILY AFTER BREAKFAST 90 tablet 3  . ezetimibe (ZETIA) 10 MG tablet TAKE ONE TABLET BY MOUTH EVERY DAY 90 tablet 3  . famotidine (PEPCID) 20 MG tablet Take 1 tablet (20 mg total) by mouth 2 (two) times daily. One after supper 60 tablet 11  . furosemide (LASIX) 20 MG tablet Take 20 mg every other day. 30 tablet 2  . glucose blood (ACCU-CHEK GUIDE) test strip Use to test blood sugar 3 times daily 150 each 3  . HUMALOG KWIKPEN 100 UNIT/ML KwikPen INJECT SUBCUTANEOUSLY 4 TO  6 UNITS 3 TIMES DAILY  BEFORE MEALS 30 mL 3  . insulin glargine (LANTUS) 100 UNIT/ML injection Inject 16 units under the skin once daily in the morning. 20 mL 3  . insulin lispro (HUMALOG) 100 UNIT/ML injection Inject 4-6 units under the skin three times daily before meals. 20 mL 3  . Insulin Pen Needle 32G X 4 MM MISC Use as instructed to inject insulin 4 times daily. 400 each 3  . loratadine (CLARITIN) 10 MG tablet Take 10 mg by mouth  daily as needed for allergies.     . metoprolol tartrate (LOPRESSOR) 25 MG tablet Take 1 tablet (25 mg total) by mouth 2 (two) times daily. 180 tablet 3  . midodrine (PROAMATINE) 2.5 MG tablet TAKE ONE TABLET BY MOUTH THREE TIMES DAILY WITH MEALS 270 tablet 3  . nitroGLYCERIN (NITROSTAT) 0.4 MG SL tablet Place 1 tablet (0.4 mg total) under the tongue every 5 (five) minutes as needed for chest pain. 30 tablet 3  . potassium chloride SA (K-DUR) 20 MEQ tablet Take 1 tablet (20 mEq total) by mouth daily. 90 tablet 3  . rosuvastatin (CRESTOR) 40 MG tablet Take 1 tablet (40 mg total) by mouth at bedtime. 90 tablet 3  . zolpidem (AMBIEN) 5 MG tablet TAKE ONE TABLET BY MOUTH AT BEDTIME AS NEEDED FOR SLEEP 14 tablet 0   No current facility-administered medications for this visit.    PHYSICAL EXAMINATION: ECOG PERFORMANCE STATUS: 1 - Symptomatic but completely ambulatory  Vitals:   05/21/19 1327  BP: (!) 116/43  Pulse: 63  Resp: 17  Temp: 98.3 F (36.8 C)  SpO2: 100%   Filed Weights   05/21/19 1327  Weight: 153 lb 8 oz (69.6 kg)    GENERAL: alert, no distress and comfortable SKIN: skin color, texture, turgor are normal, no rashes or significant lesions EYES:  normal, Conjunctiva are pink and non-injected, sclera clear OROPHARYNX: no exudate, no erythema and lips, buccal mucosa, and tongue normal  NECK: supple, thyroid normal size, non-tender, without nodularity LYMPH: no palpable lymphadenopathy in the cervical, axillary or inguinal LUNGS: clear to auscultation and percussion with normal breathing effort HEART: regular rate & rhythm and no murmurs and no lower extremity edema ABDOMEN: abdomen soft, non-tender and normal bowel sounds MUSCULOSKELETAL: no cyanosis of digits and no clubbing  NEURO: alert & oriented x 3 with fluent speech, generalized weakness, wheelchair EXTREMITIES: No lower extremity edema  LABORATORY DATA:  I have reviewed the data as listed CMP Latest Ref Rng & Units  04/21/2019 03/31/2019 03/04/2019  Glucose 65 - 99 mg/dL 144(H) 182(H) 155(H)  BUN 8 - 27 mg/dL 32(H) 39(H) 35(H)  Creatinine 0.57 - 1.00 mg/dL 2.32(H) 2.46(H) 2.32(H)  Sodium 134 - 144 mmol/L 139 139 136  Potassium 3.5 - 5.2 mmol/L 4.8 4.8 4.4  Chloride 96 - 106 mmol/L 104 102 100  CO2 20 - 29 mmol/L _0 Calcium 8.7 - 10.3 mg/dL 8.8 8.8 8.8(L)  Total Protein 6.0 - 8.5 g/dL 6.1 6.2 6.1(L)  Total Bilirubin 0.0 - 1.2 mg/dL 0.6 0.6 0.8  Alkaline Phos 39 - 117 IU/L 60 81 65  AST 0 - 40 IU/L 38 36 49(H)  ALT 0 - 32 IU/L _1 Lab Results  Component Value Date   WBC 6.1 05/21/2019   HGB 7.6 (L) 05/21/2019   HCT 25.5 (L) 05/21/2019   MCV 95.1 05/21/2019   PLT 132 (L) 05/21/2019   NEUTROABS 4.7 05/21/2019    ASSESSMENT & PLAN:  Breast cancer of upper-inner quadrant of right female breast ([place]) Patient has had longstanding anemia where the hemoglobin was between 10 to 12 g. Since April 2020 her hemoglobin has gotten worse and today it is at 6.6. During the same timeframe her creatinine got from 9.9-8.33 A25 folic acid and iron studies were normal. 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  Current treatment:Retacritinjectionsweeklystarted7/31/2020 Bone marrow biopsy 04/08/2019: Normocellular bone marrow with trilineage hematopoiesis, absent iron stores, flow cytometry no abnormal B or T-cell clonal abnormalities noted. FISH panel for MDS pending.  Recommendation: IV iron therapy Venofer weekly x4 04/24/19-05/15/19 Lab review: Hemoglobin is 7.6 Plan: 2 units of blood transfusion on 05/23/2019 Continue with Retacrit injections  CT chest: Moderate pleural effusion, right breast changes Ultrasound-guided thoracentesis 05/15/2019: 600 cc of yellow fluid removed: Benign mesothelial cells   Right breast abnormalities: This was previously biopsy-proven to be fat necrosis.  I do not recommend any further immediate  imaging.  Patient is in agreement.   Return to clinic weekly for labs and Retacrit    No orders of the defined types were placed in this encounter.  The patient has a good understanding of the overall plan. she agrees with it. she will call with any problems that may develop before the next visit here.  Nicholas Lose, MD 05/21/2019  Julious Oka Dorshimer, am acting as scribe for Dr. Nicholas Lose.  I have reviewed the above documentation for accuracy and completeness, and I agree with the above.

## 2019-05-21 ENCOUNTER — Inpatient Hospital Stay: Payer: Medicare Other

## 2019-05-21 ENCOUNTER — Inpatient Hospital Stay (HOSPITAL_BASED_OUTPATIENT_CLINIC_OR_DEPARTMENT_OTHER): Payer: Medicare Other | Admitting: Hematology and Oncology

## 2019-05-21 ENCOUNTER — Other Ambulatory Visit: Payer: Self-pay

## 2019-05-21 ENCOUNTER — Other Ambulatory Visit: Payer: Self-pay | Admitting: *Deleted

## 2019-05-21 DIAGNOSIS — D509 Iron deficiency anemia, unspecified: Secondary | ICD-10-CM

## 2019-05-21 DIAGNOSIS — D638 Anemia in other chronic diseases classified elsewhere: Secondary | ICD-10-CM

## 2019-05-21 DIAGNOSIS — C50211 Malignant neoplasm of upper-inner quadrant of right female breast: Secondary | ICD-10-CM | POA: Diagnosis not present

## 2019-05-21 DIAGNOSIS — Z170 Estrogen receptor positive status [ER+]: Secondary | ICD-10-CM

## 2019-05-21 LAB — SAMPLE TO BLOOD BANK

## 2019-05-21 LAB — CBC WITH DIFFERENTIAL (CANCER CENTER ONLY)
Abs Immature Granulocytes: 0.02 10*3/uL (ref 0.00–0.07)
Basophils Absolute: 0 10*3/uL (ref 0.0–0.1)
Basophils Relative: 1 %
Eosinophils Absolute: 0.2 10*3/uL (ref 0.0–0.5)
Eosinophils Relative: 4 %
HCT: 25.5 % — ABNORMAL LOW (ref 36.0–46.0)
Hemoglobin: 7.6 g/dL — ABNORMAL LOW (ref 12.0–15.0)
Immature Granulocytes: 0 %
Lymphocytes Relative: 8 %
Lymphs Abs: 0.5 10*3/uL — ABNORMAL LOW (ref 0.7–4.0)
MCH: 28.4 pg (ref 26.0–34.0)
MCHC: 29.8 g/dL — ABNORMAL LOW (ref 30.0–36.0)
MCV: 95.1 fL (ref 80.0–100.0)
Monocytes Absolute: 0.6 10*3/uL (ref 0.1–1.0)
Monocytes Relative: 11 %
Neutro Abs: 4.7 10*3/uL (ref 1.7–7.7)
Neutrophils Relative %: 76 %
Platelet Count: 132 10*3/uL — ABNORMAL LOW (ref 150–400)
RBC: 2.68 MIL/uL — ABNORMAL LOW (ref 3.87–5.11)
RDW: 19.4 % — ABNORMAL HIGH (ref 11.5–15.5)
WBC Count: 6.1 10*3/uL (ref 4.0–10.5)
nRBC: 0 % (ref 0.0–0.2)

## 2019-05-21 LAB — CYTOLOGY - NON PAP

## 2019-05-21 MED ORDER — EPOETIN ALFA-EPBX 40000 UNIT/ML IJ SOLN
40000.0000 [IU] | Freq: Once | INTRAMUSCULAR | Status: AC
  Administered 2019-05-21: 40000 [IU] via SUBCUTANEOUS

## 2019-05-21 MED ORDER — EPOETIN ALFA-EPBX 40000 UNIT/ML IJ SOLN
INTRAMUSCULAR | Status: AC
  Filled 2019-05-21: qty 1

## 2019-05-21 NOTE — Assessment & Plan Note (Signed)
Patient has had longstanding anemia where the hemoglobin was between 10 to 12 g. Since April 2020 her hemoglobin has gotten worse and today it is at 6.6. During the same timeframe her creatinine got from 1.9-5.09 T26 folic acid and iron studies were normal. 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  Current treatment:Retacritinjectionsweeklystarted7/31/2020 Bone marrow biopsy 04/08/2019: Normocellular bone marrow with trilineage hematopoiesis, absent iron stores, flow cytometry no abnormal B or T-cell clonal abnormalities noted. FISH panel for MDS pending.  Recommendation: IV iron therapy Venofer weekly x4 04/24/19-05/15/19 Lab review: Hemoglobin is   CT chest: Moderate pleural effusion, right breast changes Ultrasound-guided thoracentesis 05/15/2019: 600 cc of yellow fluid removed The pathology is pending   Right breast abnormalities: This was previously biopsy-proven to be fat necrosis.  I do not recommend any further immediate imaging.  Patient is in agreement.   Return to clinic weekly for labs and Retacrit

## 2019-05-21 NOTE — Patient Instructions (Signed)

## 2019-05-21 NOTE — Progress Notes (Signed)
Per MD, pt to receive two units of PRBC's Saturday 05/23/2019.  Message sent to scheduling.

## 2019-05-22 ENCOUNTER — Other Ambulatory Visit: Payer: Self-pay | Admitting: *Deleted

## 2019-05-22 ENCOUNTER — Other Ambulatory Visit: Payer: Self-pay

## 2019-05-22 DIAGNOSIS — D638 Anemia in other chronic diseases classified elsewhere: Secondary | ICD-10-CM

## 2019-05-22 LAB — PREPARE RBC (CROSSMATCH)

## 2019-05-23 ENCOUNTER — Ambulatory Visit (HOSPITAL_COMMUNITY)
Admission: RE | Admit: 2019-05-23 | Discharge: 2019-05-23 | Disposition: A | Discharge status: Home / Self Care | Payer: Medicare Other | Source: Ambulatory Visit | Attending: Hematology and Oncology | Admitting: Hematology and Oncology

## 2019-05-23 ENCOUNTER — Inpatient Hospital Stay (HOSPITAL_BASED_OUTPATIENT_CLINIC_OR_DEPARTMENT_OTHER): Payer: Medicare Other | Admitting: Hematology and Oncology

## 2019-05-23 ENCOUNTER — Other Ambulatory Visit: Payer: Self-pay

## 2019-05-23 ENCOUNTER — Encounter: Payer: Self-pay | Admitting: Hematology and Oncology

## 2019-05-23 ENCOUNTER — Inpatient Hospital Stay: Payer: Medicare Other

## 2019-05-23 DIAGNOSIS — D638 Anemia in other chronic diseases classified elsewhere: Secondary | ICD-10-CM

## 2019-05-23 DIAGNOSIS — N184 Chronic kidney disease, stage 4 (severe): Secondary | ICD-10-CM

## 2019-05-23 DIAGNOSIS — D509 Iron deficiency anemia, unspecified: Secondary | ICD-10-CM | POA: Diagnosis not present

## 2019-05-23 DIAGNOSIS — Z170 Estrogen receptor positive status [ER+]: Secondary | ICD-10-CM

## 2019-05-23 DIAGNOSIS — C50211 Malignant neoplasm of upper-inner quadrant of right female breast: Secondary | ICD-10-CM | POA: Diagnosis not present

## 2019-05-23 MED ORDER — HEPARIN SOD (PORK) LOCK FLUSH 100 UNIT/ML IV SOLN
500.0000 [IU] | Freq: Every day | INTRAVENOUS | Status: DC | PRN
  Filled 2019-05-23: qty 5

## 2019-05-23 MED ORDER — SODIUM CHLORIDE 0.9% FLUSH
10.0000 mL | INTRAVENOUS | Status: DC | PRN
  Filled 2019-05-23: qty 10

## 2019-05-23 MED ORDER — ACETAMINOPHEN 325 MG PO TABS
650.0000 mg | ORAL_TABLET | Freq: Once | ORAL | Status: AC
  Administered 2019-05-23: 650 mg via ORAL

## 2019-05-23 MED ORDER — SODIUM CHLORIDE 0.9% IV SOLUTION
250.0000 mL | Freq: Once | INTRAVENOUS | Status: AC
  Administered 2019-05-23: 250 mL via INTRAVENOUS
  Filled 2019-05-23: qty 250

## 2019-05-23 MED ORDER — ACETAMINOPHEN 325 MG PO TABS
ORAL_TABLET | ORAL | Status: AC
  Filled 2019-05-23: qty 2

## 2019-05-23 MED ORDER — DIPHENHYDRAMINE HCL 25 MG PO CAPS
25.0000 mg | ORAL_CAPSULE | Freq: Once | ORAL | Status: AC
  Administered 2019-05-23: 25 mg via ORAL

## 2019-05-23 MED ORDER — DIPHENHYDRAMINE HCL 25 MG PO CAPS
ORAL_CAPSULE | ORAL | Status: AC
  Filled 2019-05-23: qty 1

## 2019-05-23 NOTE — Addendum Note (Signed)
Addended by: Jethro Bolus A on: 05/23/2019 12:47 PM   Modules accepted: Orders

## 2019-05-23 NOTE — Patient Instructions (Signed)

## 2019-05-23 NOTE — Progress Notes (Signed)
Patient Care Team: [name], DO as PCP - General [name], NP as PCP - Cardiology (Nurse Practitioner) Evans Lance, MD as PCP - Electrophysiology (Cardiology) Alphonsa Overall, MD as Consulting Physician (General Surgery) Nicholas Lose, MD as Consulting Physician (Hematology and Oncology) Gery Pray, MD as Consulting Physician (Radiation Oncology) Evans Lance, MD as Consulting Physician (Cardiology) Collene Gobble, MD as Consulting Physician (Pulmonary Disease) Elayne Snare, MD as Consulting Physician (Endocrinology) Delice Bison, Charlestine Massed, NP as Nurse Practitioner (Hematology and Oncology) Ardelle Balls., MD (Neurology) Izora Gala, MD as Consulting Physician (Otolaryngology) Trula Slade, DPM as Consulting Physician (Podiatry)  DIAGNOSIS:  Encounter Diagnoses  Name Primary?  . Malignant neoplasm of upper-inner quadrant of right breast in female, estrogen receptor positive ([place])   . CKD (chronic kidney disease), stage IV ([place]) Yes  . Iron deficiency anemia, unspecified iron deficiency anemia type   . Anemia of chronic disease     SUMMARY OF ONCOLOGIC HISTORY: Oncology History  Breast cancer of upper-inner quadrant of right female breast ([place])  12/14/2015 Initial Diagnosis   Right breast biopsy 12:30 position: 2 masses, 1.6 cm mass: Invasive ductal carcinoma, grade 2, ER 0%, PR 0%, HER-2 negative, Ki-67 70%; satellite mass 8 mm: IDC grade 2 ER 5%, PR 5%, HER-2 negative, Ki-67 50%; T1cN0 stage IA clinical stage   01/18/2016 Surgery   Right lumpectomy: Multifocal IDC grade 3, 1.9 cm  ER 0%, PR 0%, HER-2 negative, Ki-67 70% and 0.8 cm satellite mass ER 5%, PR 2%, HER-2 negative, Ki-67 50%, high-grade DCIS, margins negative, 0/1 lymph nodes negative T1 cN0 stage IA   02/17/2016 - 04/06/2016 Chemotherapy   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  Complaining of worsening shortness of breath  INTERVAL HISTORY: Hannah [name] is a breast cancer survivor who is currently under our management for Anemia.  Anemia has persisted in spite of Retacrit injections and iron infusions.  Bone marrow biopsy did not reveal myelodysplastic syndrome.  She underwent recent thoracentesis with 600 mL of straw-colored fluid was removed and it was found to be benign.  Since the procedure she has had persistent difficulty with breathing.  Today she is here for blood transfusion and I saw her in the infusion room for the symptoms.  REVIEW OF SYSTEMS:   Constitutional: Denies fevers, chills or abnormal weight loss Eyes: Denies blurriness of vision Ears, nose, mouth, throat, and face: Denies mucositis or sore throat Respiratory: Persistent shortness of breath Cardiovascular: Denies palpitation, chest discomfort Gastrointestinal:  Denies nausea, heartburn or change in bowel habits Skin: Denies abnormal skin rashes Lymphatics: Denies new lymphadenopathy or easy bruising Neurological:Denies numbness, tingling or new weaknesses Behavioral/Psych: Feeling weak and fatigued Extremities: No lower extremity edema All other systems were reviewed with the patient and are negative.  I have reviewed the past medical history, past surgical history, social history and family history with the patient and they are unchanged from previous note.  ALLERGIES:  is allergic to amoxicillin; tape; aldactone [spironolactone]; imdur [isosorbide dinitrate]; arimidex  [anastrozole]; latex; and tetracycline.  MEDICATIONS:  Current Outpatient Medications  Medication Sig Dispense Refill  . acetaminophen (TYLENOL) 325 MG tablet Take 2 tablets (650 mg total) by mouth every 6 (six) hours as needed for mild pain (or Fever >/= 101).    Marland Kitchen albuterol (PROVENTIL HFA;VENTOLIN HFA) 108 (90 Base) MCG/ACT inhaler Inhale 2 puffs into the lungs every 6 (six) hours as needed for wheezing or shortness of breath.    Marland Kitchen amiodarone (PACERONE) 200 MG tablet Take 1 tablet (200 mg total) by mouth daily. 90 tablet 3  . apixaban (ELIQUIS) 5 MG TABS tablet Take 1 tablet (5 mg total) by mouth 2 (two) times daily. 180 tablet 2  . Blood Glucose Monitoring Suppl (ACCU-CHEK GUIDE ME) w/Device KIT Use accu chek meter to check blood sugar three times daily. 1 kit 0  . budesonide-formoterol (SYMBICORT) 160-4.5 MCG/ACT inhaler INHALE TWO PUFFS INTO THE LUNGS TWICE DAILY 3 Inhaler 1  . doxycycline (DORYX) 100 MG EC tablet Take 1 tablet (100 mg total) by mouth 2 (two) times daily. 14 tablet 0  . epoetin alfa-epbx (RETACRIT) 46270 UNIT/ML injection Inject 40,000 Units into the skin every Friday.    Marland Kitchen exemestane (AROMASIN) 25 MG tablet TAKE ONE TABLET BY MOUTH DAILY AFTER BREAKFAST 90 tablet 3  . ezetimibe (ZETIA) 10 MG tablet TAKE ONE TABLET BY MOUTH EVERY DAY 90 tablet 3  . famotidine (PEPCID) 20 MG tablet Take 1 tablet (20 mg total) by mouth 2 (two) times daily. One after supper 60 tablet 11  . furosemide (LASIX) 20 MG tablet Take 20 mg every other day. 30 tablet 2  . glucose blood (ACCU-CHEK GUIDE) test strip Use to test blood sugar 3 times daily 150 each 3  . HUMALOG KWIKPEN 100 UNIT/ML KwikPen INJECT SUBCUTANEOUSLY 4 TO  6 UNITS 3 TIMES DAILY  BEFORE MEALS 30 mL 3  . insulin glargine (LANTUS) 100 UNIT/ML injection Inject 16 units under the skin once daily in the morning. 20 mL 3  . insulin lispro (HUMALOG) 100 UNIT/ML injection Inject 4-6 units under the skin three times daily before meals. 20 mL  3  . Insulin Pen Needle 32G X 4 MM MISC Use as instructed to inject insulin 4 times daily. 400 each 3  . loratadine (CLARITIN) 10 MG tablet Take 10 mg by mouth daily as needed for allergies.     . metoprolol tartrate (LOPRESSOR) 25 MG tablet Take 1 tablet (25 mg total) by mouth 2 (two) times daily. 180 tablet 3  . midodrine (PROAMATINE) 2.5 MG tablet TAKE ONE TABLET BY MOUTH THREE TIMES DAILY WITH MEALS 270 tablet 3  . nitroGLYCERIN (NITROSTAT) 0.4 MG SL tablet Place 1 tablet (0.4 mg total) under the tongue every 5 (five) minutes as needed for chest pain. 30 tablet 3  . potassium chloride SA (K-DUR) 20 MEQ tablet Take 1 tablet (20 mEq total) by mouth daily. 90 tablet 3  . rosuvastatin (CRESTOR) 40 MG tablet Take 1 tablet (40 mg total) by mouth at bedtime. 90 tablet 3  . zolpidem (AMBIEN) 5 MG tablet TAKE ONE TABLET BY MOUTH AT BEDTIME AS NEEDED FOR SLEEP 14 tablet 0   No current facility-administered medications for this visit.   Facility-Administered Medications Ordered in Other Visits  Medication Dose Route Frequency Provider Last Rate Last Admin  . heparin lock flush 100 unit/mL  500 Units Intracatheter Daily PRN Nicholas Lose, MD      .  sodium chloride flush (NS) 0.9 % injection 10 mL  10 mL Intracatheter PRN Nicholas Lose, MD        PHYSICAL EXAMINATION: ECOG PERFORMANCE STATUS: 2 - Symptomatic, <50% confined to bed  There were no vitals filed for this visit. There were no vitals filed for this visit.  GENERAL:alert, no distress and comfortable SKIN: skin color, texture, turgor are normal, no rashes or significant lesions EYES: normal, Conjunctiva are pink and non-injected, sclera clear OROPHARYNX:no exudate, no erythema and lips, buccal mucosa, and tongue normal  NECK: supple, thyroid normal size, non-tender, without nodularity LYMPH:  no palpable lymphadenopathy in the cervical, axillary or inguinal LUNGS: Diminished breath sounds of the right lung base and crackles HEART:  regular rate & rhythm and no murmurs and no lower extremity edema ABDOMEN:abdomen soft, non-tender and normal bowel sounds MUSCULOSKELETAL:no cyanosis of digits and no clubbing  NEURO: alert & oriented x 3 with fluent speech, no focal motor/sensory deficits EXTREMITIES: No lower extremity edema    LABORATORY DATA:  I have reviewed the data as listed CMP Latest Ref Rng & Units 04/21/2019 03/31/2019 03/04/2019  Glucose 65 - 99 mg/dL 144(H) 182(H) 155(H)  BUN 8 - 27 mg/dL 32(H) 39(H) 35(H)  Creatinine 0.57 - 1.00 mg/dL 2.32(H) 2.46(H) 2.32(H)  Sodium 134 - 144 mmol/L 139 139 136  Potassium 3.5 - 5.2 mmol/L 4.8 4.8 4.4  Chloride 96 - 106 mmol/L 104 102 100  CO2 20 - 29 mmol/L 22 22 26   Calcium 8.7 - 10.3 mg/dL 8.8 8.8 8.8(L)  Total Protein 6.0 - 8.5 g/dL 6.1 6.2 6.1(L)  Total Bilirubin 0.0 - 1.2 mg/dL 0.6 0.6 0.8  Alkaline Phos 39 - 117 IU/L 60 81 65  AST 0 - 40 IU/L 38 36 49(H)  ALT 0 - 32 IU/L 25 23 27     Lab Results  Component Value Date   WBC 6.1 05/21/2019   HGB 7.6 (L) 05/21/2019   HCT 25.5 (L) 05/21/2019   MCV 95.1 05/21/2019   PLT 132 (L) 05/21/2019   NEUTROABS 4.7 05/21/2019    ASSESSMENT & PLAN:  Breast cancer of upper-inner quadrant of right female breast ([place]) Patient has had longstanding anemia where the hemoglobin was between 10 to 12 g. Since April 2020 her hemoglobin has gotten worse and today it is at 6.6. During the same timeframe her creatinine got from 0.2-7.25 D66 folic acid and iron studies were normal. 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  Current treatment:Retacritinjectionsweeklystarted7/31/2020 Bone marrow biopsy 04/08/2019: Normocellular bone marrow with trilineage hematopoiesis, absent iron stores, flow cytometry no abnormal B or T-cell clonal abnormalities noted.   IV iron therapy:  Venofer weekly x4 04/24/19-05/15/19  CT chest: Moderate pleural effusion, right breast  changes Ultrasound-guided thoracentesis 05/15/2019: 600 cc of yellow fluid removed ----------------------------------------------------------- Severe shortness of breath: Status post recent thoracentesis.  Clinical examination reveals diminished breath sounds in the right lung base along with crackles. Recurrent pleural effusion could be related to heart failure.  I recommended follow-up with cardiology.  Plan: Chest x-ray today after blood transfusion is complete  Return to clinic weekly for labs and Retacrit    No orders of the defined types were placed in this encounter.  The patient has a good understanding of the overall plan. she agrees with it. she will call with any problems that may develop before the next visit here.   Harriette Ohara, MD 05/23/19

## 2019-05-23 NOTE — Assessment & Plan Note (Addendum)
Patient has had longstanding anemia where the hemoglobin was between 10 to 12 g. Since April 2020 her hemoglobin has gotten worse and today it is at 6.6. During the same timeframe her creatinine got from 5.7-8.46 N62 folic acid and iron studies were normal. 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  Current treatment:Retacritinjectionsweeklystarted7/31/2020 Bone marrow biopsy 04/08/2019: Normocellular bone marrow with trilineage hematopoiesis, absent iron stores, flow cytometry no abnormal B or T-cell clonal abnormalities noted.   IV iron therapy:  Venofer weekly x4 04/24/19-05/15/19  CT chest: Moderate pleural effusion, right breast changes Ultrasound-guided thoracentesis 05/15/2019: 600 cc of yellow fluid removed ----------------------------------------------------------- Severe shortness of breath: Status post recent thoracentesis.  Clinical examination reveals diminished breath sounds in the right lung base along with crackles. Recurrent pleural effusion could be related to heart failure.  I recommended follow-up with cardiology.  Plan: Chest x-ray today after blood transfusion is complete      Return to clinic weekly for labs and Retacrit

## 2019-05-25 ENCOUNTER — Other Ambulatory Visit: Payer: Self-pay | Admitting: Nurse Practitioner

## 2019-05-25 ENCOUNTER — Telehealth: Payer: Self-pay | Admitting: Hematology and Oncology

## 2019-05-25 ENCOUNTER — Telehealth: Payer: Self-pay | Admitting: Nurse Practitioner

## 2019-05-25 DIAGNOSIS — J90 Pleural effusion, not elsewhere classified: Secondary | ICD-10-CM

## 2019-05-25 DIAGNOSIS — R0609 Other forms of dyspnea: Secondary | ICD-10-CM

## 2019-05-25 DIAGNOSIS — R0600 Dyspnea, unspecified: Secondary | ICD-10-CM

## 2019-05-25 LAB — TYPE AND SCREEN
ABO/RH(D): O POS
Antibody Screen: NEGATIVE
Unit division: 0
Unit division: 0

## 2019-05-25 LAB — BPAM RBC
Blood Product Expiration Date: 202101122359
Blood Product Expiration Date: 202101122359
ISSUE DATE / TIME: 202012120826
ISSUE DATE / TIME: 202012120826
Unit Type and Rh: 5100
Unit Type and Rh: 5100

## 2019-05-25 IMAGING — DX DG CHEST 1V PORT
2 series · 2 of 2 positions shown · non-contrast
Comparison: 06/29/2016

CLINICAL DATA: Syncope

EXAM:
PORTABLE CHEST 1 VIEW

[chest ap (1 of 2)]
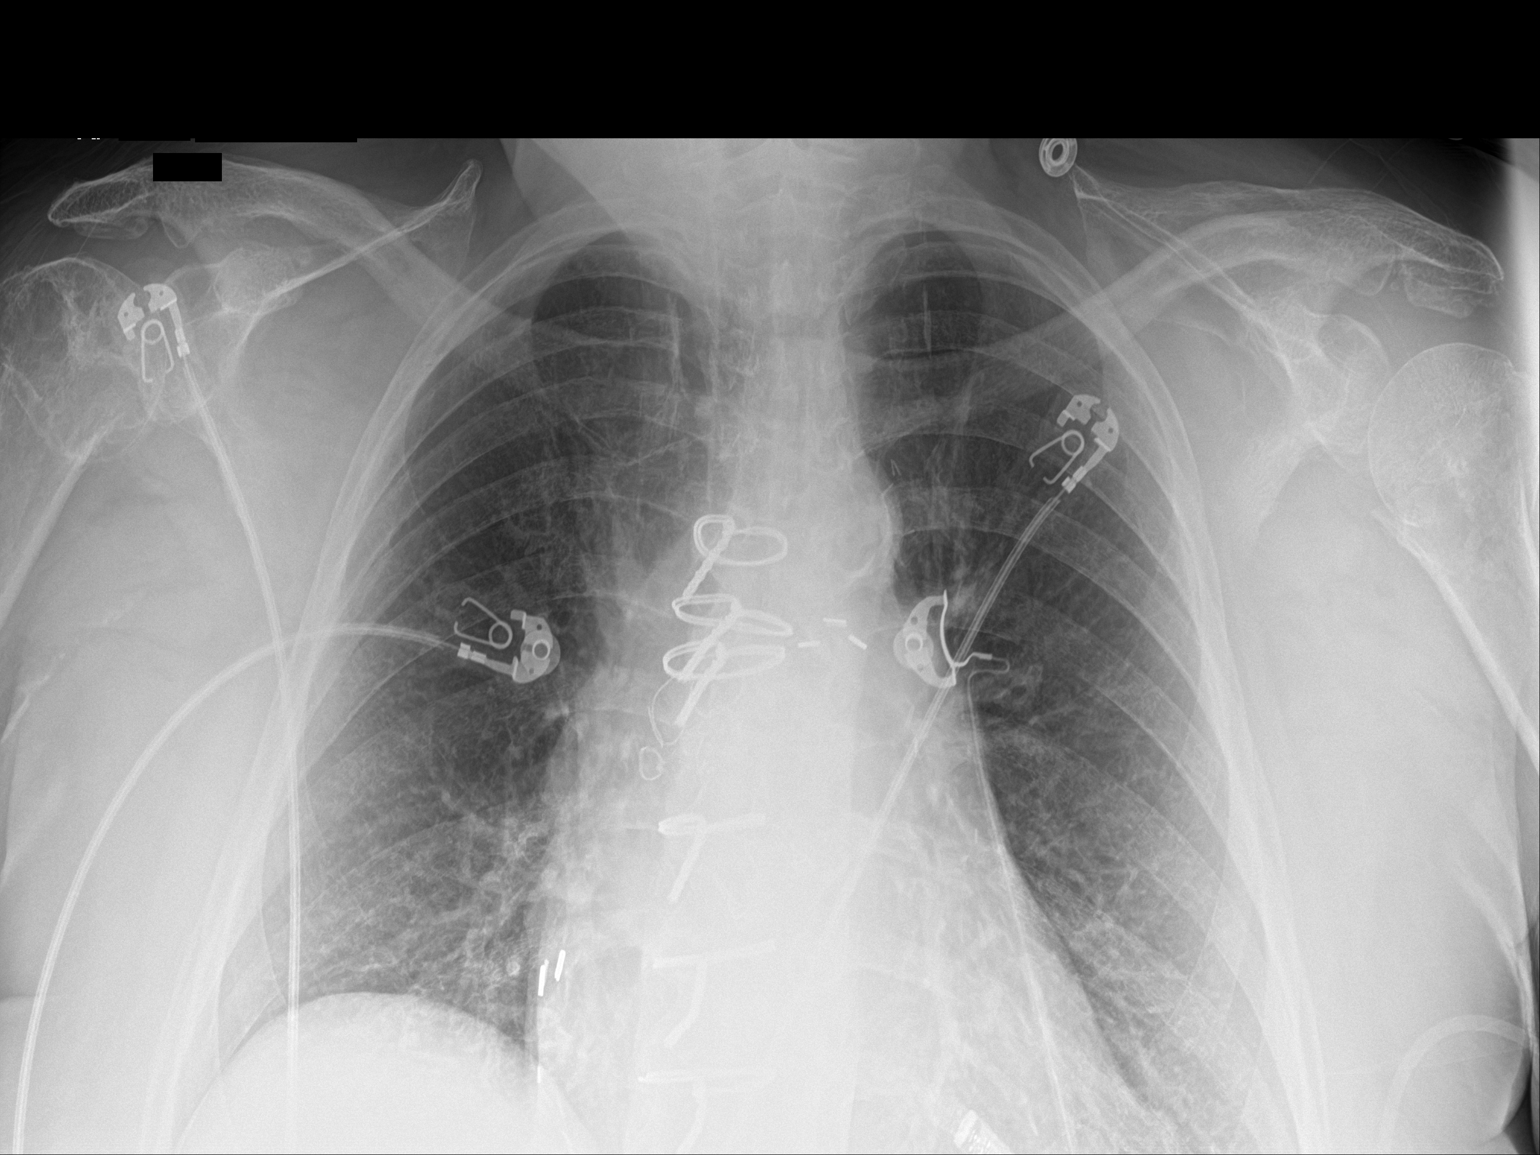

[chest ap (2 of 2)]
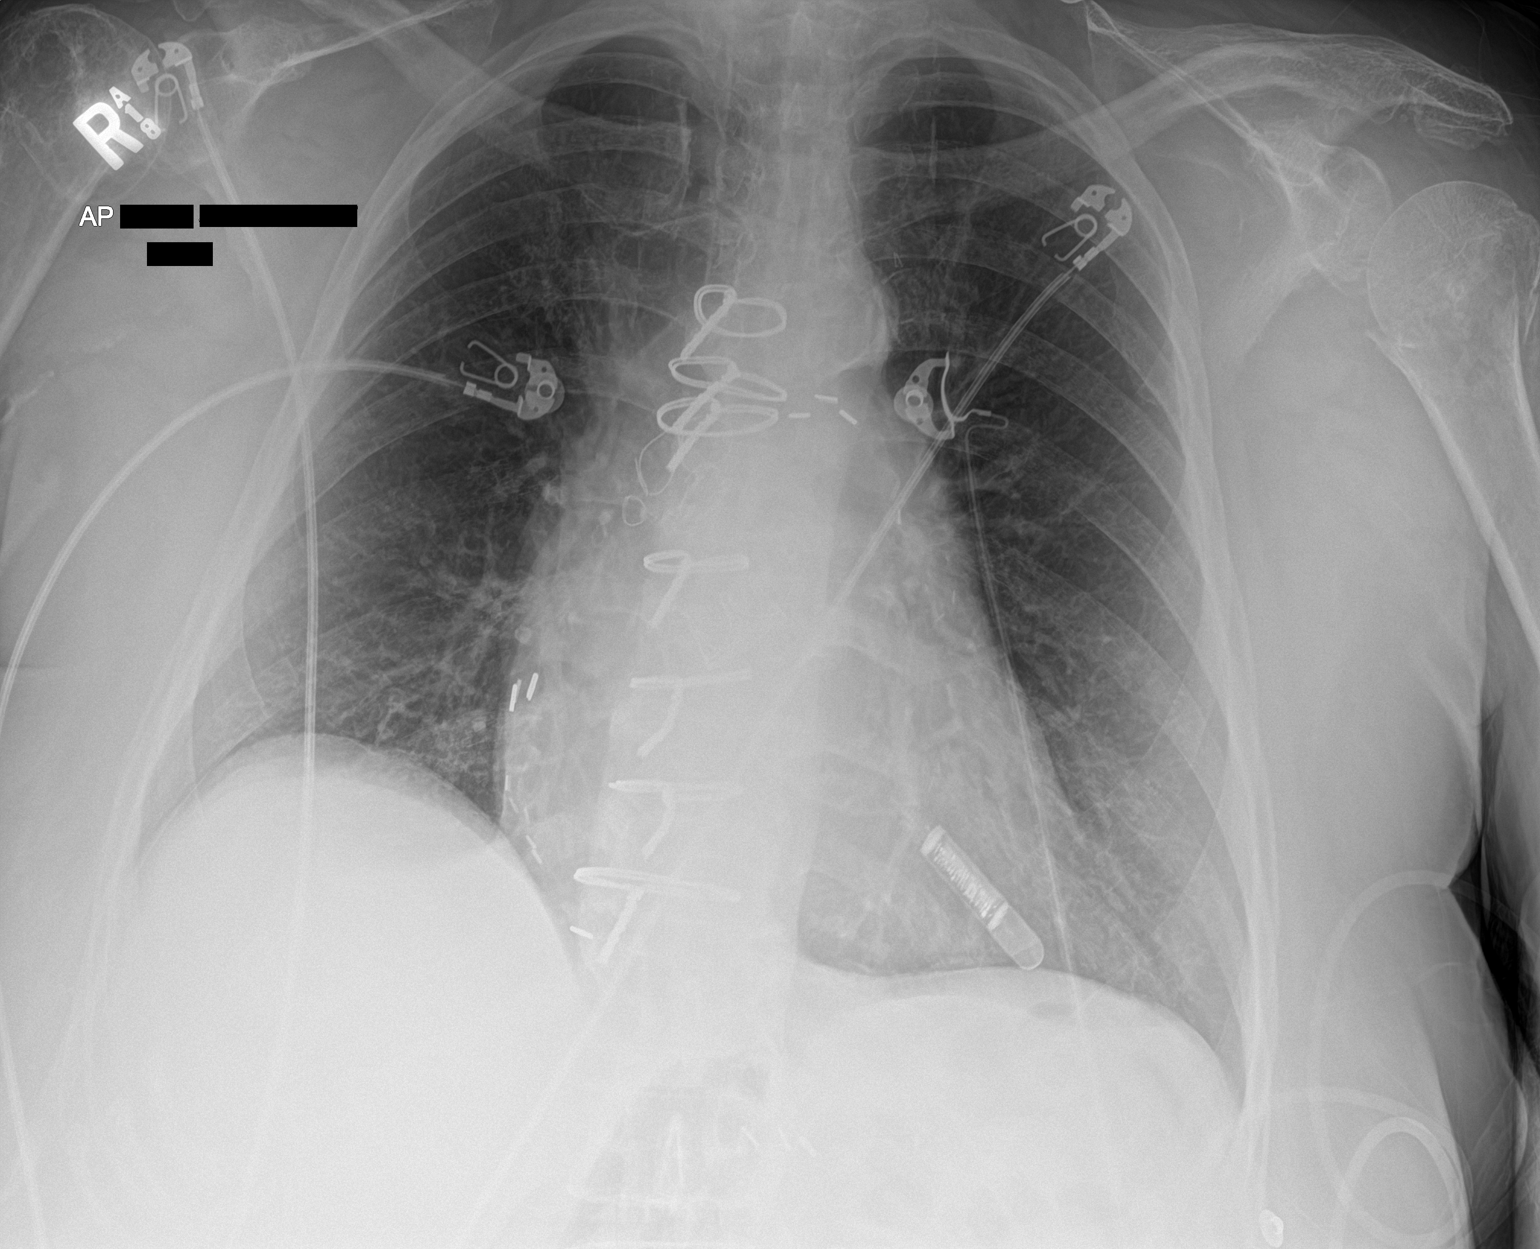

[2 of 2 positions shown; findings below may reference images not displayed]

FINDINGS: There is no focal parenchymal opacity. There is no pleural effusion
or pneumothorax. The cardiomediastinal silhouette is stable. There
is evidence of prior CABG.

The osseous structures are unremarkable.
IMPRESSION: No active disease.

## 2019-05-25 NOTE — Telephone Encounter (Signed)
Hannah [name] reached out to me today to tell me the events from the weekend.   Ended up needing transfusion.   Still short of breath - Dr. Lindi Adie sent her for a CXR - effusion is already mild to moderate despite thoracentesis 10 days ago.   Will repeat CXR next week prior to our visits. She has seen Dr. Lamonte Sakai in the past.   [name], RN, [facility] [address] [place], [place]  [id number] [phone]

## 2019-05-25 NOTE — Telephone Encounter (Signed)
I informed her that the chest x-ray showed small to moderate pleural effusion. It is not severe enough to explain the profound fatigue and shortness of breath to minimal exertion.  I believe it is multifactorial.  The anemia along with cardiac disease and pleural effusions are causing her problems.  We just give her blood transfusion and she is feeling slightly better.

## 2019-05-27 NOTE — Progress Notes (Signed)
CARDIOLOGY OFFICE NOTE  Date:  06/02/2019    Hannah [name] Date of Birth: Nov 12, 1945 Medical Record #[id number]  PCP:  [name], DO  Cardiologist:  Atilano Median  Chief Complaint  Patient presents with   Follow-up    1 month check.     History of Present Illness: Hannah [name] is a 73 y.o. female who presents today for a follow up visit.  Seen for Dr. Lovena Le. Primarily follows with me.   She has a very complex/extensive history - this includes ahistory ofCAD s/p CABG (1998 with redo 2009 - Dr. Cyndia Bent), bioprosthetic AVR in 3354, chronic diastolic CHF, asthma, carotid artery disease (1-39% by duplex 10/2017, due 2021), CKD stage IV (Dr. Posey Pronto), PAF- has been on Multaq in the past and now on amiodarone, atrial tachycardia, breast CA, orthostasis,chronicanemia, thrombocytopenia, mild dilation of ascending aorta by echo 10/2018, recurrent syncope/orthostasis,recurrentright pleural effusion requiring thoracentesis4/2020 and 05/2019, HTN, GERD,&HLD.   I have followed her for many years - she has had periodic issues with volume overload, orthostasis and tachy palpitations.Last cath in 2014 - grafts patent. Myoview in 2015 without ischemia.  Over the past few months she has had more issues with her pleural effusion - diuretics caused worsening CKD/orthostasis - resulting in injury. She has gotten more anemic (down to 6) - back to hematology and now on therapy. Concern for MDS and has had bone marrow biopsy - was also needing colonoscopy due to profound anemia (HGB down to 6 which is highly unusual for [name]).Most recent abdominal ultrasound noted and concerning for liver abnormality. Recent mammogram also recently noted and abnormal - ended up having multiple repeat biopsies that were negative.  Most recent course has been pretty eventful - she has had to be placed back on low dose diuretics for a recurring effusion. Pulmonary wished for me to dose her diuretics  due to her fragile nature with orthostasis/CKD.  Progressive failure to thrive. Has been back to the hospital in the interim - treated for CHF and pneumonia. Remains markedly anemic - needed colonoscopy. Has had abnormal mammogram with 4 subsequent negative biopsies. At one visit in September - she was coming in here and got dizzy/fell in the parking lot on the curb and had significant laceration to the back of her her head. ILR interrogation was ok at that time. Have added back Midodrine for orthostasis to prevent injury. She has been slow to recover and make progress. Has seen nephrology who thought her kidney function was stable. Ended up having CT in place of colonoscopy per GI.   I have followed her closely over the past several months - still very fearful of another fall/syncopal/orthostatic episode. She has continued to be short of breath. Repeat CXR with increasing effusion and concern for pneumonia (again) - given antibiotics and increased her diuretics with very little to basically no improvement - ended up getting CT scan - subsequent thoracentesis on 12/4.   She had 600 ml drawn off on 12/4 - by a week later, it was re-accumulating - sent for CXR yesterday. Cytology has been negative.   The patient does not have symptoms concerning for COVID-19 infection (fever, chills, cough, or new shortness of breath).   Comes in today. Here alone. She is frustrated. She is short of breath - some days worse than others. She will use extra diuretic based on her weights. BP will get low - she tries to stay hydrated to prevent syncope. Some chest pain with  eating - she feels this is GERD - nothing exertional but not really doing that much at home. Will not go outside unless someone is with her. Not driving. The laceration on her head has finally scabbed/sloughed off. She has had repeat transfusion earlier this month as well. BP always lower at home than here in the office.   Past Medical History:  Diagnosis  Date   Abnormally small mouth    Allergic rhinitis 10/14/2009   Qualifier: Diagnosis of  By: Lamonte Sakai MD, Rose Fillers   Overview:  Overview:  Qualifier: Diagnosis of  By: Lamonte Sakai MD, Rose Fillers  Last Assessment & Plan:  Please continue Xyzal and Nasacort as you have been using them   Anemia    Asthma 05/12/2009   10/12/2014 p extensive coaching HFA effectiveness =    75% s spacer    Overview:  Overview:  10/12/2014 p extensive coaching HFA effectiveness =    75% s spacer   Last Assessment & Plan:  Please continue Symbicort 2 puffs twice a day. Remember to rinse and gargle after taking this medication. Take albuterol 2 puffs up to every 4 hours if needed for shortness of breath.  Follow with Dr Lamonte Sakai in 6 month   Bilateral carotid artery stenosis    a. mild 1-39% by duplex 10/2017, due 2021.   Breast cancer ([place]) 01/18/2016   right breast   CAD (coronary artery disease) 01/24/2011   a. s/p CABG 1998 with redo 2009.   Chemotherapy induced nausea and vomiting 02/24/2016   Chemotherapy-induced peripheral neuropathy ([place]) 04/27/2016   Chemotherapy-induced thrombocytopenia 04/06/2016   Chronic diastolic CHF (congestive heart failure) ([place]) 09/24/2013   CKD (chronic kidney disease), stage IV ([place]) 09/24/2013   Closed fracture of head of left humerus 12/18/1503   Complication of anesthesia    Essential hypertension 05/12/2009   Qualifier: Diagnosis of  By: Lamonte Sakai MD, Rose Fillers    Gallstones    GERD (gastroesophageal reflux disease)    Glaucoma    H/O atrial tachycardia 05/12/2009   Qualifier: History of  By: Lamonte Sakai MD, Rose Fillers   Overview:  Overview:  Qualifier: History of  By: Lamonte Sakai MD, Rose Fillers  Last Assessment & Plan:  There was no evidence of this on her cardiac monitor. I would recommend watchful waiting.    History of non-ST elevation myocardial infarction (NSTEMI)    Sept 2014--  thought to be type II HTN w/ LHC without infarct related artery and patent grafts   History of radiation therapy  05/24/16-07/26/16   right breast 50.4 Gy in 28 fractions, right breast boost 10 Gy in 5 fractions   Hyperlipidemia    Hypertension    Resolved.   Iron deficiency anemia    Mania ([place]) 03/05/2016   Moderate persistent asthma    pulmologist-  Dr. Malvin Johns   Osteomyelitis of toe of left foot ([place]) 04/06/2016   Osteopenia of multiple sites 10/19/2015   PAF (paroxysmal atrial fibrillation) ([place])    Personal history of chemotherapy 2017   Personal history of radiation therapy 2017   PONV (postoperative nausea and vomiting)    Port catheter in place 02/17/2016   Psoriasis    right leg   Renal calculus, right    S/P AVR    prosthesis valve placement 2009 at same time re-do CABG   Seizure-like activity ([place]) 09/01/2017   Sensorineural hearing loss (SNHL) of both ears 01/06/2016   Stroke [facility])    residual rt hearing loss   Syncope  09/06/2017   Type 2 diabetes mellitus ([place])    monitored by dr Dwyane Dee    Past Surgical History:  Procedure Laterality Date   AORTIC VALVE REPLACEMENT  2009   [facility] (done same time is CABG)   BREAST LUMPECTOMY Right 01/18/2016   BREAST LUMPECTOMY WITH RADIOACTIVE SEED AND SENTINEL LYMPH NODE BIOPSY Right 01/18/2016   Procedure: RIGHT BREAST LUMPECTOMY WITH RADIOACTIVE SEED AND SENTINEL LYMPH NODE BIOPSY;  Surgeon: DAlphonsa Overall MD;  [facility]  Service: General;  Laterality: Right;   CARDIAC CATHETERIZATION  03/23/2008   Pre-redo CABG: L main OK, LAD (T), CFX (T), OM1 99%, RCA (T), LIMA-LAD OK, SVG-OM(?3) OK w/ little florw to OM2, SVG-RCA OK. EF NL   CARPAL TUNNEL RELEASE     CHOLECYSTECTOMY N/A 07/07/2018   Procedure: LAPAROSCOPIC CHOLECYSTECTOMY WITH INTRAOPERATIVE CHOLANGIOGRAM ERAS PATHWAY;  Surgeon: NAlphonsa Overall MD;  [facility];  Service: General;  Laterality: N/A;   COLONOSCOPY     around 2015. Possibly with [place]  re-do 2009   Had LIMA to DX/LAD,  SVG [facility] 2 marginal branches and [facility]; SVG to 3rd OM and PD at time of redo   CYSTOSCOPY W/ URETERAL STENT PLACEMENT Right 12/20/2014   Procedure: CYSTOSCOPY WITH RETROGRADE PYELOGRAM/URETERAL STENT PLACEMENT;  Surgeon: PCleon Gustin MD;  [facility]  Service: Urology;  Laterality: Right;   ESOPHAGOGASTRODUODENOSCOPY     many years ago per patient    ESOPHAGOGASTRODUODENOSCOPY ENDOSCOPY  06/17/2018   EYE SURGERY Bilateral    cataracts   HOLMIUM LASER APPLICATION Right 74/62/7035  Procedure:  HOLMIUM LASER LITHOTRIPSY;  Surgeon: PCleon Gustin MD;  [facility]  Service: Urology;  Laterality: Right;   IR THORACENTESIS ASP PLEURAL SPACE W/IMG GUIDE  10/03/2018   LEFT HEART CATHETERIZATION WITH CORONARY/GRAFT ANGIOGRAM N/A 02/23/2013   Procedure: LEFT HEART CATHETERIZATION WITH CBeatrix Fetters  Surgeon: MBlane Ohara MD;  [facility];  Service: Cardiovascular;  Laterality: N/A;   LOOP RECORDER INSERTION N/A 08/30/2017   Procedure: LOOP RECORDER INSERTION;  Surgeon: TEvans Lance MD;  [facility];  Service: Cardiovascular;  Laterality: N/A;   PORTACATH PLACEMENT Left 01/18/2016   Procedure: INSERTION PORT-A-CATH;  Surgeon: DAlphonsa Overall MD;  [place] [facility]  Service: General;  Laterality: Left;   portacath removal     TONSILLECTOMY     TRANSTHORACIC ECHOCARDIOGRAM  02-24-2013      mild LVH,  ef 50-55%/  AV bioprosthesis was present with very mild stenosis and no regurg., mean grandient 137mg, peak grandient 2050m /  mild MR/  mild LAE and RAE/  moderate TR   TUBAL LIGATION       Medications: Current Meds  Medication Sig   acetaminophen (TYLENOL) 325 MG tablet Take 2 tablets (650 mg total) by mouth every 6 (six) hours as needed for mild pain (or Fever >/= 101).   albuterol (PROVENTIL HFA;VENTOLIN HFA) 108 (90 Base) MCG/ACT inhaler Inhale 2 puffs into the lungs  every 6 (six) hours as needed for wheezing or shortness of breath.   amiodarone (PACERONE) 200 MG tablet Take 1 tablet (200 mg total) by mouth daily.   apixaban (ELIQUIS) 5 MG TABS tablet Take 1 tablet (5 mg total) by mouth 2 (two) times daily.   Blood Glucose Monitoring Suppl (ACCU-CHEK GUIDE ME) w/Device KIT Use accu chek meter to  check blood sugar three times daily.   budesonide-formoterol (SYMBICORT) 160-4.5 MCG/ACT inhaler INHALE TWO PUFFS INTO THE LUNGS TWICE DAILY   doxycycline (DORYX) 100 MG EC tablet Take 1 tablet (100 mg total) by mouth 2 (two) times daily.   epoetin alfa-epbx (RETACRIT) 16384 UNIT/ML injection Inject 40,000 Units into the skin every Friday.   exemestane (AROMASIN) 25 MG tablet TAKE ONE TABLET BY MOUTH DAILY AFTER BREAKFAST   ezetimibe (ZETIA) 10 MG tablet TAKE ONE TABLET BY MOUTH EVERY DAY   famotidine (PEPCID) 20 MG tablet Take 1 tablet (20 mg total) by mouth 2 (two) times daily. One after supper   furosemide (LASIX) 20 MG tablet Take 20 mg every other day.   glucose blood (ACCU-CHEK GUIDE) test strip Use to test blood sugar 3 times daily   HUMALOG KWIKPEN 100 UNIT/ML KwikPen INJECT SUBCUTANEOUSLY 4 TO  6 UNITS 3 TIMES DAILY  BEFORE MEALS   insulin glargine (LANTUS) 100 UNIT/ML injection Inject 16 units under the skin once daily in the morning.   insulin lispro (HUMALOG) 100 UNIT/ML injection Inject 4-6 units under the skin three times daily before meals.   Insulin Pen Needle 32G X 4 MM MISC Use as instructed to inject insulin 4 times daily.   loratadine (CLARITIN) 10 MG tablet Take 10 mg by mouth daily as needed for allergies.    midodrine (PROAMATINE) 2.5 MG tablet TAKE ONE TABLET BY MOUTH THREE TIMES DAILY WITH MEALS   potassium chloride SA (K-DUR) 20 MEQ tablet Take 1 tablet (20 mEq total) by mouth daily.   rosuvastatin (CRESTOR) 40 MG tablet Take 1 tablet (40 mg total) by mouth at bedtime.   zolpidem (AMBIEN) 5 MG tablet TAKE ONE TABLET BY  MOUTH AT BEDTIME AS NEEDED FOR SLEEP     Allergies: Allergies  Allergen Reactions   Amoxicillin Rash and Other (See Comments)    Tolerates Cephalosporins Has patient had a PCN reaction causing immediate rash, facial/tongue/throat swelling, SOB or lightheadedness with hypotension: Yes Has patient had a PCN reaction causing severe rash involving mucus membranes or skin necrosis: Yes Has patient had a PCN reaction that required hospitalization No Has patient had a PCN reaction occurring within the last 10 years: No If all of the above answers are "NO", then may proceed with Cephalosporin use.    Tape Other (See Comments)    Must use paper tape   Aldactone [Spironolactone] Other (See Comments)    CKD/hypokalemia   Imdur [Isosorbide Dinitrate] Other (See Comments)    Headache/severe hypotension/Syncope   Arimidex [Anastrozole] Nausea Only   Latex Itching and Other (See Comments)    (Dentist office)   Tetracycline Rash    Social History: The patient  reports that she has never smoked. She has never used smokeless tobacco. She reports that she does not drink alcohol or use drugs.   Family History: The patient's family history includes Diabetes in her maternal grandmother and son; Healthy in her brother; Heart attack in her brother; Heart disease in her brother, father, and maternal grandmother; Heart failure in her father.   Review of Systems: Please see the history of present illness.   All other systems are reviewed and negative.   Physical Exam: VS:  BP (!) 150/68    Pulse 69    Ht 5' 5"  (1.651 m)    Wt 150 lb (68 kg)    SpO2 99%    BMI 24.96 kg/m  .  BMI Body mass index is 24.96 kg/m.  Wt  Readings from Last 3 Encounters:  06/02/19 150 lb (68 kg)  05/21/19 153 lb 8 oz (69.6 kg)  05/12/19 153 lb 12.8 oz (69.8 kg)    General: Pleasant. Chronically ill. Alert in no acute distress. Doesn't look as sallow today. Weight is stable.   HEENT: Normal. Prior laceration  resolving.  Neck: Supple, no JVD, carotid bruits, or masses noted.  Cardiac: Regular rate and rhythm. Harsh outflow murmur noted. 1+ edema. She has her support stockings in place.  Respiratory:  Lungs are clear to auscultation bilaterally with normal work of breathing.  GI: Soft and nontender.  MS: No deformity or atrophy. Gait and ROM intact.  Skin: Warm and dry. Color is normal.  Neuro:  Strength and sensation are intact and no gross focal deficits noted.  Psych: Alert, appropriate and with normal affect.   LABORATORY DATA:  EKG:  EKG is not ordered today.   Lab Results  Component Value Date   WBC 6.3 05/28/2019   HGB 10.7 (L) 05/28/2019   HCT 35.8 (L) 05/28/2019   PLT 144 (L) 05/28/2019   GLUCOSE 144 (H) 04/21/2019   CHOL 106 12/02/2018   TRIG 97 12/02/2018   HDL 34 (L) 12/02/2018   LDLDIRECT 53.0 10/08/2016   LDLCALC 53 12/02/2018   ALT 25 04/21/2019   AST 38 04/21/2019   NA 139 04/21/2019   K 4.8 04/21/2019   CL 104 04/21/2019   CREATININE 2.32 (H) 04/21/2019   BUN 32 (H) 04/21/2019   CO2 22 04/21/2019   TSH 3.040 04/21/2019   INR 1.9 (H) 03/04/2019   HGBA1C 5.6 02/09/2019   MICROALBUR 53.4 (H) 05/12/2018     BNP (last 3 results) Recent Labs    03/04/19 1429  BNP 273.4*    ProBNP (last 3 results) Recent Labs    10/28/18 1041 02/19/19 1126 02/23/19 1221  PROBNP 364.0* 2,996* 2,825*     Other Studies Reviewed Today:  CXR 06/01/2019 IMPRESSION: 1. Stable moderate to large right pleural effusion with associated passive atelectasis. 2. Aortic valve prosthesis. 3. Chronic deformity of the proximal humerus bilaterally.   Electronically Signed   By: Van Clines M.D.   On: 06/01/2019 16:56   CT CHEST IMPRESSION 04/2019: Moderate right pleural effusion, increased from prior CT. Associated right lower lobe atelectasis.  No evidence of pneumonia.  1.2 x 2.4 cm lesion along the inferior aspect of the right breast/chest wall. While  additional findings noted above correlate with areas of fat necrosis, this lesion was not clearly identified when correlating with prior breast tomography report. Breast imaging consultation is suggested for further evaluation, including potential ultrasound with biopsy versus breast MR. These results will be called to the ordering clinician or representative by the Radiologist Assistant, and communication documented in the PACS or zVision Dashboard.  Aortic Atherosclerosis (ICD10-I70.0).   Electronically Signed By: Julian Hy M.D. On: 05/06/2019 18:11   CT ABDOMENIMPRESSION10/2020: Chest Impression:  1. Mild RIGHT basilar atelectasis and small RIGHT effusion. 2. Increased skin thickening in the lateral RIGHT breast. Recommend clinical correlation. 3. Stable seroma in the RIGHT breast.  Abdomen / Pelvis Impression:  1. No acute findings in the abdomen pelvis. 2. Nonobstructing calculi in the LEFT renal pelvis. 3. Degenerative endplate change in the lumbar spine similar to comparison CT 2019. 4. Coronary artery calcification and Aortic Atherosclerosis (ICD10-I70.0).   Electronically Signed By: Suzy Bouchard M.D. On: 03/17/2019 09:59   CXRIMPRESSION9/04/2019: Small right effusion and right lower lobe airspace disease unchanged. Negative for edema.  Electronically Signed By: Franchot Gallo M.D. On: 02/20/2019 15:39   ABD [place] 02/17/2019 Other findings: There is a somewhat lobulated fluid collection identified along the inferior aspect of the liver. This measures 2.6 x 3.4 x 4.3 cm. This was not seen on the prior CT examination and may be related to a focal fluid collection from prior cholecystectomy. Mild perihepatic ascites is noted.  Note is made of right-sided pleural effusion.  IMPRESSION: Status post cholecystectomy. There is a lobulated cystic area as described above along the inferior right hepatic margin. This  could be related to the prior surgery possibly representing a small seroma. CT may be helpful for further evaluation.  Small right pleural effusion.  Mild ascites.  Increased echogenicity within the liver likely related to fatty infiltration or underlying hepatocellular disease.  Electronically Signed By: Inez Catalina M.D. On: 02/17/2019 10:18  Breast Ultrasound 01/2019 IMPRESSION: 1. New irregular hypoechoic mass along the posterior margin of the scar right breast 8 o'clock position. Findings are nonspecific however concerning for the possibility of localized recurrence. 2. Within the outer aspect of the right axilla there are two adjacent irregular hypoechoic masses just deep to the skin which are nonspecific. These may represent focal fat necrosis or potentially recurrent or metastatic disease.  RECOMMENDATION: 1. Ultrasound-guided core needle biopsy right breast mass 8 o'clock position just deep to the scar. 2. Ultrasound-guided core needle biopsy of both irregular hypoechoic masses within the outer aspect of the right axilla.   ECHOIMPRESSIONS5/2020  1. The left ventricle has normal systolic function with an ejection fraction of 60-65%. The cavity size was normal. There is mildly increased left ventricular wall thickness. Left ventricular diastolic Doppler parameters are consistent with  pseudonormalization. 2. The right ventricle has normal systolic function. The cavity was mildly enlarged. 3. Left atrial size was moderately dilated. 4. Right atrial size was mildly dilated. 5. The mitral valve is grossly normal. Mild thickening of the mitral valve leaflet. There is mild mitral annular calcification present. 6. The tricuspid valve is grossly normal. 7. There is mild dilatation of the ascending aorta measuring 40 mm. 8. Normal LV systolic function; moderate diastolic dysfunction; mild LVH; mildly dilated ascending aorta; s/p AVR with mean  gradient of 18 mmHg and no AI; biatrial enlargement; mild MR; mild RVE.    MYOVIEW FINDINGS FROM 06/2013: Normal resting EKG. Slight ST depressions noted in aVL after administration of lexiscan. Mild shortness of breath and nausea resolved after the test. EKG is nondiagnostic for ischemia. TID ratio 1.05. Lung-heart ratio 0.43. Normal ventricular chamber size.  IMPRESSION: No evidence for ischemia. Normal wall motion. LVEF 72%.  Electronically Signed By: Guy Sandifer   Cardiac Cath: 02/23/2013 Left mainstem: Normal  Left anterior descending (LAD): Severe proximal and mid calcification with long proximal 95% stenosis. The mid and distal vessel is small but free of high grade disease.  Left circumflex (LCx):AV groove has a mid 90% stenosis before a moderate sized MOM. OM1 and OM2 are occluded at the ostium and fill via the SVG. OM3 occluded at the ostium and fills via SVG. The grafted OMs are small and diffusely disease.  Right coronary artery (RCA): Occluded in the mid vessel. The PDA is moderate sized and occluded at the ostium. There is a 70% stenosis in the PDA after the insertion of the vein graft.  Grafts:  LIMA to LAD: Patent  SVG to RCA: Occluded (from original CABG)  SVG to RCA: Patent (from redo CABG). This is mild diffuse plaque within the  vein graft.  SVG to OM3: Patent. This is mild diffuse plaque within the vein graft.  SVG sequential to OM1/OM2: Patent. There is ostial 50% stenosis. There is a patent proximal SVG stent. The native marginals are very small.  Left ventriculography: LV not injected. AVR not crossed.  Final Conclusions: Severe native vessel CAD. Patent grafts as described with nonobstructive disease in the grafts. A stent placed into the sequential SVG is patent with some disease at the ostium that on several views in not occlusive. I did compare the 2009 cath with results today. There is high grade disease in the circumflex AV groove. However, this  is not changed from 2009. However, this lesion does appear to lead into a non grafted OM. If she has further symptoms I would consider PCI of the native circumflex.     Assessment/Plan:  1.Reaccumulating pleural effusion - very challenging situation. Unclear etiology and still worrisome to me given her history and prior CT scan. Additional diuresis leads to worsening CKD and worsening orthostasis and has had prior injury. Discussed at length with her. Have also asked Dr. Cyndia Bent to review her scan/CXR - we will arrange for repeat thoracentesis next Monday and then arrange follow up with Dr. Cyndia Bent to discuss possible Pleurex insertion. She does not wish to have pleurex done by IR and would feel more comfortable with Dr. Cyndia Bent. Referral placed.   2. Orthostatic hypotension - remains on Midodrine - she has had prior injury with fractured shoulder and significant head laceration.   3. Chronic diastolic HF - she manages her Lasix based on weight. She is restricting her salt. Her BP at home is typically very soft.   4. Abnormal abdominal US - has had Ct of the abdomen as well - not discussed today. She does not see GI at this time.   5. Profound anemia - has had recent transfusion - followed by Dr. Lindi Adie  6. CAD/aortic valve disease - remote redo CABG/prior AVR - managed medically - no exertional chest pain. CT with lots of calcium noted. Would favor continued medical management. Would be high risk for cardiac cath given her CKD. I would like to get her echo updated and make sure we have not had changes.   7. CKD - stage IV - she has had recent visit with Dr. Posey Pronto and she said this was stable. Lab today.   8.History of orthostasis - reports negative adrenal work up - I see where she had an [place] from March 2019 -stillnot sure if we need to re-consider this going forward.Still may need to consider.She is using MidodrineTID. She does keep a good check on her BP for [place].  9. PAF - in  sinuson exam. Remains on low dose amiodarone.   10.Carotid disease-noted 1 to 39% bilateral stenosis from study 10/2017 -will need repeat study 10/2019- not discussed today  11. Prior breast cancer - recent abnormal mammogram - s/p multiple biopsies.Abnormality noted on the CT - she has had recent biopsies that were negative.This has been reviewed previously with Dr. Lindi Adie - she does note significant indentation/tenderness of the breast - she is going to discuss further with him on follow up. Would defer further imaging to Dr. Lindi Adie.   12. COVID-19 Education: The signs and symptoms of COVID-19 were discussed with the patient and how to seek care for testing (follow up with PCP or arrange E-visit).  The importance of social distancing, staying at home, hand hygiene and wearing a mask when out in public  were discussed today.  Current medicines are reviewed with the patient today.  The patient does not have concerns regarding medicines other than what has been noted above.  The following changes have been made:  See above.  Labs/ tests ordered today include:    Orders Placed This Encounter  Procedures   IR THORACENTESIS ASP PLEURAL SPACE W/IMG GUIDE   Basic metabolic panel   CBC no Diff   Ambulatory referral to Cardiothoracic Surgery     Disposition:   FU with me in 4 weeks. Thoracentesis next Monday. COVID testing on Christmas Eve. Appointment with Dr. Cyndia Bent with CXR for 06/17/2019.     Patient is agreeable to this plan and will call if any problems develop in the interim.   SignedTruitt Merle, NP  06/02/2019 3:05 PM  [facility] [address] [place], [place]  [address] Phone: [phone] Fax: [phone]

## 2019-05-28 ENCOUNTER — Inpatient Hospital Stay: Payer: Medicare Other

## 2019-05-28 ENCOUNTER — Telehealth: Payer: Self-pay | Admitting: Hematology and Oncology

## 2019-05-28 ENCOUNTER — Other Ambulatory Visit: Payer: Self-pay | Admitting: Adult Health

## 2019-05-28 ENCOUNTER — Other Ambulatory Visit: Payer: Self-pay

## 2019-05-28 VITALS — BP 128/48 | HR 56 | Temp 98.2°F | Resp 18

## 2019-05-28 DIAGNOSIS — D638 Anemia in other chronic diseases classified elsewhere: Secondary | ICD-10-CM

## 2019-05-28 DIAGNOSIS — C50211 Malignant neoplasm of upper-inner quadrant of right female breast: Secondary | ICD-10-CM | POA: Diagnosis not present

## 2019-05-28 DIAGNOSIS — Z170 Estrogen receptor positive status [ER+]: Secondary | ICD-10-CM

## 2019-05-28 LAB — CBC WITH DIFFERENTIAL (CANCER CENTER ONLY)
Abs Immature Granulocytes: 0.02 10*3/uL (ref 0.00–0.07)
Basophils Absolute: 0.1 10*3/uL (ref 0.0–0.1)
Basophils Relative: 1 %
Eosinophils Absolute: 0.3 10*3/uL (ref 0.0–0.5)
Eosinophils Relative: 5 %
HCT: 35.8 % — ABNORMAL LOW (ref 36.0–46.0)
Hemoglobin: 10.7 g/dL — ABNORMAL LOW (ref 12.0–15.0)
Immature Granulocytes: 0 %
Lymphocytes Relative: 8 %
Lymphs Abs: 0.5 10*3/uL — ABNORMAL LOW (ref 0.7–4.0)
MCH: 29.4 pg (ref 26.0–34.0)
MCHC: 29.9 g/dL — ABNORMAL LOW (ref 30.0–36.0)
MCV: 98.4 fL (ref 80.0–100.0)
Monocytes Absolute: 0.7 10*3/uL (ref 0.1–1.0)
Monocytes Relative: 11 %
Neutro Abs: 4.7 10*3/uL (ref 1.7–7.7)
Neutrophils Relative %: 75 %
Platelet Count: 144 10*3/uL — ABNORMAL LOW (ref 150–400)
RBC: 3.64 MIL/uL — ABNORMAL LOW (ref 3.87–5.11)
RDW: 18.3 % — ABNORMAL HIGH (ref 11.5–15.5)
WBC Count: 6.3 10*3/uL (ref 4.0–10.5)
nRBC: 0 % (ref 0.0–0.2)

## 2019-05-28 MED ORDER — EPOETIN ALFA-EPBX 40000 UNIT/ML IJ SOLN
40000.0000 [IU] | Freq: Once | INTRAMUSCULAR | Status: AC
  Administered 2019-05-28: 40000 [IU] via SUBCUTANEOUS

## 2019-05-28 MED ORDER — EPOETIN ALFA-EPBX 40000 UNIT/ML IJ SOLN
INTRAMUSCULAR | Status: AC
  Filled 2019-05-28: qty 1

## 2019-05-28 NOTE — Patient Instructions (Signed)

## 2019-05-28 NOTE — Telephone Encounter (Signed)
Returned patient's phone call regarding rescheduling 12/24 appointments, per patient's request appointments have been rescheduled for an earlier time.

## 2019-06-01 ENCOUNTER — Ambulatory Visit
Admission: RE | Admit: 2019-06-01 | Discharge: 2019-06-01 | Disposition: A | Discharge status: Home / Self Care | Payer: Medicare Other | Source: Ambulatory Visit | Attending: Nurse Practitioner | Admitting: Nurse Practitioner

## 2019-06-01 DIAGNOSIS — J90 Pleural effusion, not elsewhere classified: Secondary | ICD-10-CM

## 2019-06-01 DIAGNOSIS — R0600 Dyspnea, unspecified: Secondary | ICD-10-CM

## 2019-06-01 DIAGNOSIS — R0609 Other forms of dyspnea: Secondary | ICD-10-CM

## 2019-06-02 ENCOUNTER — Ambulatory Visit (INDEPENDENT_AMBULATORY_CARE_PROVIDER_SITE_OTHER): Payer: Medicare Other | Admitting: Nurse Practitioner

## 2019-06-02 ENCOUNTER — Encounter: Payer: Self-pay | Admitting: Nurse Practitioner

## 2019-06-02 ENCOUNTER — Other Ambulatory Visit: Payer: Self-pay

## 2019-06-02 VITALS — BP 150/68 | HR 69 | Ht 65.0 in | Wt 150.0 lb

## 2019-06-02 DIAGNOSIS — J90 Pleural effusion, not elsewhere classified: Secondary | ICD-10-CM | POA: Diagnosis not present

## 2019-06-02 DIAGNOSIS — I2510 Atherosclerotic heart disease of native coronary artery without angina pectoris: Secondary | ICD-10-CM

## 2019-06-02 DIAGNOSIS — N184 Chronic kidney disease, stage 4 (severe): Secondary | ICD-10-CM | POA: Diagnosis not present

## 2019-06-02 DIAGNOSIS — R0609 Other forms of dyspnea: Secondary | ICD-10-CM

## 2019-06-02 DIAGNOSIS — I5032 Chronic diastolic (congestive) heart failure: Secondary | ICD-10-CM

## 2019-06-02 DIAGNOSIS — Z952 Presence of prosthetic heart valve: Secondary | ICD-10-CM

## 2019-06-02 DIAGNOSIS — R0600 Dyspnea, unspecified: Secondary | ICD-10-CM

## 2019-06-02 NOTE — Patient Instructions (Addendum)
After Visit Summary:  We will be checking the following labs today - BMET & CBC   Medication Instructions:    Continue with your current medicines.   Hold the Eliquis 2 days prior to the thoracentesis  Hold your diabetic medicines Monday prior to the procedure.    If you need a refill on your cardiac medications before your next appointment, please call your pharmacy.     Testing/Procedures To Be Arranged:  Repeat Thoracentesis on Monday, December 28th at 2:00PM - go to Admitting at 1:45 to register and then to Radiology  COVID testing on this Thursday at [facility] at 10:40 AM - quarantine afterwards until your procedure.   Follow-Up:   See me in one month  We will get you a visit with Dr. Cyndia Bent at [facility] a week of so after the thoracentesis to discuss pleurex catheter placement. His nurse's name is "Ryan".     At [facility], you and your health needs are our priority.  As part of our continuing mission to provide you with exceptional heart care, we have created designated Provider Care Teams.  These Care Teams include your primary Cardiologist (physician) and Advanced Practice Providers (APPs -  Physician Assistants and Nurse Practitioners) who all work together to provide you with the care you need, when you need it.  Special Instructions:  . Stay safe, stay home, wash your hands for at least 20 seconds and wear a mask when out in public.  . It was good to talk with you today.    Call the [place] office at [phone] if you have any questions, problems or concerns.

## 2019-06-03 ENCOUNTER — Telehealth: Payer: Self-pay | Admitting: *Deleted

## 2019-06-03 LAB — BASIC METABOLIC PANEL
BUN/Creatinine Ratio: 15 (ref 12–28)
BUN: 22 mg/dL (ref 8–27)
CO2: 28 mmol/L (ref 20–29)
Calcium: 8.8 mg/dL (ref 8.7–10.3)
Chloride: 104 mmol/L (ref 96–106)
Creatinine, Ser: 1.44 mg/dL — ABNORMAL HIGH (ref 0.57–1.00)
GFR calc Af Amer: 42 mL/min/{1.73_m2} — ABNORMAL LOW (ref >59)
GFR calc non Af Amer: 36 mL/min/{1.73_m2} — ABNORMAL LOW (ref >59)
Glucose: 159 mg/dL — ABNORMAL HIGH (ref 65–99)
Potassium: 3.3 mmol/L — ABNORMAL LOW (ref 3.5–5.2)
Sodium: 144 mmol/L (ref 134–144)

## 2019-06-03 LAB — CBC
Hematocrit: 36.2 % (ref 34.0–46.6)
Hemoglobin: 11.4 g/dL (ref 11.1–15.9)
MCH: 29.3 pg (ref 26.6–33.0)
MCHC: 31.5 g/dL (ref 31.5–35.7)
MCV: 93 fL (ref 79–97)
Platelets: 154 10*3/uL (ref 150–450)
RBC: 3.89 x10E6/uL (ref 3.77–5.28)
RDW: 16.7 % — ABNORMAL HIGH (ref 11.7–15.4)
WBC: 6.3 10*3/uL (ref 3.4–10.8)

## 2019-06-03 NOTE — Telephone Encounter (Signed)
Pt calling in today to confirm to hold eliquis two days prior to thoracentesis. Also discussed to hold diabetic meds the am of thora.

## 2019-06-03 NOTE — Progress Notes (Signed)
Patient Care Team: [name], DO as PCP - General [name], NP as PCP - Cardiology (Nurse Practitioner) Evans Lance, MD as PCP - Electrophysiology (Cardiology) Alphonsa Overall, MD as Consulting Physician (General Surgery) Nicholas Lose, MD as Consulting Physician (Hematology and Oncology) Gery Pray, MD as Consulting Physician (Radiation Oncology) Evans Lance, MD as Consulting Physician (Cardiology) Collene Gobble, MD as Consulting Physician (Pulmonary Disease) Elayne Snare, MD as Consulting Physician (Endocrinology) Delice Bison, Charlestine Massed, NP as Nurse Practitioner (Hematology and Oncology) Ardelle Balls., MD (Neurology) Izora Gala, MD as Consulting Physician (Otolaryngology) Trula Slade, DPM as Consulting Physician (Podiatry)  DIAGNOSIS:    ICD-10-CM   1. Malignant neoplasm of upper-inner quadrant of right breast in female, estrogen receptor positive ([place])  C50.211    Z17.0   2. Anemia of chronic disease  D63.8     SUMMARY OF ONCOLOGIC HISTORY: Oncology History  Breast cancer of upper-inner quadrant of right female breast ([place])  12/14/2015 Initial Diagnosis   Right breast biopsy 12:30 position: 2 masses, 1.6 cm mass: Invasive ductal carcinoma, grade 2, ER 0%, PR 0%, HER-2 negative, Ki-67 70%; satellite mass 8 mm: IDC grade 2 ER 5%, PR 5%, HER-2 negative, Ki-67 50%; T1cN0 stage IA clinical stage   01/18/2016 Surgery   Right lumpectomy: Multifocal IDC grade 3, 1.9 cm  ER 0%, PR 0%, HER-2 negative, Ki-67 70% and 0.8 cm satellite mass ER 5%, PR 2%, HER-2 negative, Ki-67 50%, high-grade DCIS, margins negative, 0/1 lymph nodes negative T1 cN0 stage IA   02/17/2016 - 04/06/2016 Chemotherapy   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 with above-mentioned history of right breast cancer treated with lumpectomy,adjuvant chemotherapy,radiation, and whois currently on antiestrogen therapywith exemestane.Shealsohas a history of anemia and is currently receiving Retacritinjections and IV iron. She received one unit of PRBCs on 05/23/19. Shepresents to the clinic today forfollow-up. She is going through pulmonary and cardiology regarding her recurrent pleural effusions.  She is expecting to get a Pleurx catheter placement but she is awaiting the consultations.  She is breathing a lot better today although the pleural effusion appears to be reaccumulating.  REVIEW OF SYSTEMS:   Constitutional: Denies fevers, chills or abnormal weight loss Eyes: Denies blurriness of vision Ears, nose, mouth, throat, and face: Denies mucositis or sore throat Respiratory: Shortness of breath exertion Cardiovascular: Denies palpitation, chest discomfort Gastrointestinal: Denies nausea, heartburn or change in bowel habits Skin: Denies abnormal skin rashes Lymphatics: Denies new lymphadenopathy or easy bruising Neurological: Denies numbness, tingling or new weaknesses Behavioral/Psych: Mood is stable, no new changes  Extremities: No lower extremity edema Breast: denies any pain or lumps or nodules in either breasts All other systems were reviewed with the patient and are negative.  I have reviewed the past medical history, past surgical history, social history and family history with the patient and they are unchanged from previous note.  ALLERGIES:  is allergic to amoxicillin; tape;  aldactone [spironolactone];  imdur [isosorbide dinitrate]; arimidex [anastrozole]; latex; and tetracycline.  MEDICATIONS:  Current Outpatient Medications  Medication Sig Dispense Refill  . acetaminophen (TYLENOL) 325 MG tablet Take 2 tablets (650 mg total) by mouth every 6 (six) hours as needed for mild pain (or Fever >/= 101).    Marland Kitchen albuterol (PROVENTIL HFA;VENTOLIN HFA) 108 (90 Base) MCG/ACT inhaler Inhale 2 puffs into the lungs every 6 (six) hours as needed for wheezing or shortness of breath.    Marland Kitchen amiodarone (PACERONE) 200 MG tablet Take 1 tablet (200 mg total) by mouth daily. 90 tablet 3  . apixaban (ELIQUIS) 5 MG TABS tablet Take 1 tablet (5 mg total) by mouth 2 (two) times daily. 180 tablet 2  . Blood Glucose Monitoring Suppl (ACCU-CHEK GUIDE ME) w/Device KIT Use accu chek meter to check blood sugar three times daily. 1 kit 0  . budesonide-formoterol (SYMBICORT) 160-4.5 MCG/ACT inhaler INHALE TWO PUFFS INTO THE LUNGS TWICE DAILY 3 Inhaler 1  . doxycycline (DORYX) 100 MG EC tablet Take 1 tablet (100 mg total) by mouth 2 (two) times daily. 14 tablet 0  . epoetin alfa-epbx (RETACRIT) 93818 UNIT/ML injection Inject 40,000 Units into the skin every Friday.    Marland Kitchen exemestane (AROMASIN) 25 MG tablet TAKE ONE TABLET BY MOUTH DAILY AFTER BREAKFAST 90 tablet 3  . ezetimibe (ZETIA) 10 MG tablet TAKE ONE TABLET BY MOUTH EVERY DAY 90 tablet 3  . famotidine (PEPCID) 20 MG tablet Take 1 tablet (20 mg total) by mouth 2 (two) times daily. One after supper 60 tablet 11  . furosemide (LASIX) 20 MG tablet Take 20 mg every other day. 30 tablet 2  . glucose blood (ACCU-CHEK GUIDE) test strip Use to test blood sugar 3 times daily 150 each 3  . HUMALOG KWIKPEN 100 UNIT/ML KwikPen INJECT SUBCUTANEOUSLY 4 TO  6 UNITS 3 TIMES DAILY  BEFORE MEALS 30 mL 3  . insulin glargine (LANTUS) 100 UNIT/ML injection Inject 16 units under the skin once daily in the morning. 20 mL 3  . insulin lispro (HUMALOG) 100 UNIT/ML injection Inject 4-6 units under the  skin three times daily before meals. 20 mL 3  . Insulin Pen Needle 32G X 4 MM MISC Use as instructed to inject insulin 4 times daily. 400 each 3  . loratadine (CLARITIN) 10 MG tablet Take 10 mg by mouth daily as needed for allergies.     . metoprolol tartrate (LOPRESSOR) 25 MG tablet Take 1 tablet (25 mg total) by mouth 2 (two) times daily. 180 tablet 3  . midodrine (PROAMATINE) 2.5 MG tablet TAKE ONE TABLET BY MOUTH THREE TIMES DAILY WITH MEALS 270 tablet 3  . nitroGLYCERIN (NITROSTAT) 0.4 MG SL tablet Place 1 tablet (0.4 mg total) under the tongue every 5 (five) minutes as needed for chest pain. 30 tablet 3  . potassium chloride SA (K-DUR) 20 MEQ tablet Take 1 tablet (20 mEq total) by mouth daily. 90 tablet 3  . rosuvastatin (CRESTOR) 40 MG tablet Take 1 tablet (40 mg total) by mouth at bedtime. 90 tablet 3  . zolpidem (AMBIEN) 5 MG tablet TAKE ONE TABLET BY MOUTH AT BEDTIME AS NEEDED FOR SLEEP 14 tablet 0   No current facility-administered medications for this visit.    PHYSICAL EXAMINATION: ECOG PERFORMANCE STATUS: 2 - Symptomatic, <50% confined to bed  Vitals:   06/04/19 0800  BP: (!) 125/46  Pulse: 64  Resp: 18  Temp: 98.2 F (36.8 C)  SpO2: 99%   Filed  Weights   06/04/19 0800  Weight: 149 lb 9.6 oz (67.9 kg)    GENERAL: alert, no distress and comfortable SKIN: skin color, texture, turgor are normal, no rashes or significant lesions EYES: normal, Conjunctiva are pink and non-injected, sclera clear OROPHARYNX: no exudate, no erythema and lips, buccal mucosa, and tongue normal  NECK: supple, thyroid normal size, non-tender, without nodularity LYMPH: no palpable lymphadenopathy in the cervical, axillary or inguinal LUNGS: Recurrent pleural effusion HEART: regular rate & rhythm and no murmurs and no lower extremity edema ABDOMEN: abdomen soft, non-tender and normal bowel sounds MUSCULOSKELETAL: no cyanosis of digits and no clubbing  NEURO: alert & oriented x 3 with fluent  speech, no focal motor/sensory deficits EXTREMITIES: Mild lower extremity edema  LABORATORY DATA:  I have reviewed the data as listed CMP Latest Ref Rng & Units 06/02/2019 04/21/2019 03/31/2019  Glucose 65 - 99 mg/dL 159(H) 144(H) 182(H)  BUN 8 - 27 mg/dL 22 32(H) 39(H)  Creatinine 0.57 - 1.00 mg/dL 1.44(H) 2.32(H) 2.46(H)  Sodium 134 - 144 mmol/L 144 139 139  Potassium 3.5 - 5.2 mmol/L 3.3(L) 4.8 4.8  Chloride 96 - 106 mmol/L 104 104 102  CO2 20 - 29 mmol/L _0 Calcium 8.7 - 10.3 mg/dL 8.8 8.8 8.8  Total Protein 6.0 - 8.5 g/dL - 6.1 6.2  Total Bilirubin 0.0 - 1.2 mg/dL - 0.6 0.6  Alkaline Phos 39 - 117 IU/L - 60 81  AST 0 - 40 IU/L - 38 36  ALT 0 - 32 IU/L - 25 23    Lab Results  Component Value Date   WBC 6.0 06/04/2019   HGB 11.4 (L) 06/04/2019   HCT 37.8 06/04/2019   MCV 97.9 06/04/2019   PLT 149 (L) 06/04/2019   NEUTROABS 3.9 06/04/2019    ASSESSMENT & PLAN:  Anemia of chronic disease Patient has had longstanding anemia where the hemoglobin was between 10 to 12 g. Since April 2020 her hemoglobin has gotten worse and today it is at 6.6. During the same timeframe her creatinine got from 7.4-8.27 M78 folic acid and iron studies were normal. 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  Current treatment:Retacritinjectionsweeklystarted7/31/2020 Bone marrow biopsy 04/08/2019: Normocellular bone marrow with trilineage hematopoiesis, absent iron stores, flow cytometry no abnormal B or T-cell clonal abnormalities noted.   IV iron therapy:  Venofer weekly x4 04/24/19-05/15/19  CT chest: Moderate pleural effusion, right breast changes Ultrasound-guided thoracentesis 05/15/2019: 600 cc of yellow fluid removed ----------------------------------------------------------- Severe shortness of breath: Status post recent thoracentesis.  Clinical examination reveals diminished breath sounds in the right lung base  along with crackles. Recurrent pleural effusion could be related to heart failure.  I recommended follow-up with cardiology.  Plan:  1.  Weekly Retacrit injections: Today's injections will be canceled because her hemoglobin is 11.4.  We will cancel next week's appointment as well and we will see her back in 2 weeks. 2.  Supportive care with blood transfusions as needed  Return to clinic in 2 weeks for labs and injection appointment and follow-up with me.   No orders of the defined types were placed in this encounter.  The patient has a good understanding of the overall plan. she agrees with it. she will call with any problems that may develop before the next visit here.  Nicholas Lose, MD 06/04/2019  Julious Oka Dorshimer, am acting as scribe for Dr. Nicholas Lose.  I have reviewed the above document for accuracy  and completeness, and I agree with the above.

## 2019-06-04 ENCOUNTER — Telehealth: Payer: Self-pay | Admitting: Hematology and Oncology

## 2019-06-04 ENCOUNTER — Inpatient Hospital Stay: Payer: Medicare Other

## 2019-06-04 ENCOUNTER — Other Ambulatory Visit (HOSPITAL_COMMUNITY)
Admission: RE | Admit: 2019-06-04 | Discharge: 2019-06-04 | Disposition: A | Discharge status: Home / Self Care | Payer: Medicare Other | Source: Ambulatory Visit | Attending: Nurse Practitioner | Admitting: Nurse Practitioner

## 2019-06-04 ENCOUNTER — Inpatient Hospital Stay: Payer: Medicare Other | Admitting: Hematology and Oncology

## 2019-06-04 ENCOUNTER — Other Ambulatory Visit: Payer: Self-pay

## 2019-06-04 ENCOUNTER — Inpatient Hospital Stay (HOSPITAL_BASED_OUTPATIENT_CLINIC_OR_DEPARTMENT_OTHER): Payer: Medicare Other | Admitting: Hematology and Oncology

## 2019-06-04 DIAGNOSIS — D638 Anemia in other chronic diseases classified elsewhere: Secondary | ICD-10-CM

## 2019-06-04 DIAGNOSIS — Z170 Estrogen receptor positive status [ER+]: Secondary | ICD-10-CM | POA: Diagnosis not present

## 2019-06-04 DIAGNOSIS — Z20828 Contact with and (suspected) exposure to other viral communicable diseases: Secondary | ICD-10-CM | POA: Diagnosis not present

## 2019-06-04 DIAGNOSIS — Z01812 Encounter for preprocedural laboratory examination: Secondary | ICD-10-CM | POA: Insufficient documentation

## 2019-06-04 DIAGNOSIS — C50211 Malignant neoplasm of upper-inner quadrant of right female breast: Secondary | ICD-10-CM

## 2019-06-04 LAB — CBC WITH DIFFERENTIAL (CANCER CENTER ONLY)
Abs Immature Granulocytes: 0.02 10*3/uL (ref 0.00–0.07)
Basophils Absolute: 0.1 10*3/uL (ref 0.0–0.1)
Basophils Relative: 1 %
Eosinophils Absolute: 0.4 10*3/uL (ref 0.0–0.5)
Eosinophils Relative: 7 %
HCT: 37.8 % (ref 36.0–46.0)
Hemoglobin: 11.4 g/dL — ABNORMAL LOW (ref 12.0–15.0)
Immature Granulocytes: 0 %
Lymphocytes Relative: 13 %
Lymphs Abs: 0.8 10*3/uL (ref 0.7–4.0)
MCH: 29.5 pg (ref 26.0–34.0)
MCHC: 30.2 g/dL (ref 30.0–36.0)
MCV: 97.9 fL (ref 80.0–100.0)
Monocytes Absolute: 0.8 10*3/uL (ref 0.1–1.0)
Monocytes Relative: 14 %
Neutro Abs: 3.9 10*3/uL (ref 1.7–7.7)
Neutrophils Relative %: 65 %
Platelet Count: 149 10*3/uL — ABNORMAL LOW (ref 150–400)
RBC: 3.86 MIL/uL — ABNORMAL LOW (ref 3.87–5.11)
RDW: 18.7 % — ABNORMAL HIGH (ref 11.5–15.5)
WBC Count: 6 10*3/uL (ref 4.0–10.5)
nRBC: 0 % (ref 0.0–0.2)

## 2019-06-04 LAB — SAMPLE TO BLOOD BANK

## 2019-06-04 NOTE — Assessment & Plan Note (Signed)
Patient has had longstanding anemia where the hemoglobin was between 10 to 12 g. Since April 2020 her hemoglobin has gotten worse and today it is at 6.6. During the same timeframe her creatinine got from 1.5-2.44 B12 folic acid and iron studies were normal. 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  Current treatment:Retacritinjectionsweeklystarted7/31/2020 Bone marrow biopsy 04/08/2019: Normocellular bone marrow with trilineage hematopoiesis, absent iron stores, flow cytometry no abnormal B or T-cell clonal abnormalities noted.   IV iron therapy:  Venofer weekly x4 04/24/19-05/15/19  CT chest: Moderate pleural effusion, right breast changes Ultrasound-guided thoracentesis 05/15/2019: 600 cc of yellow fluid removed ----------------------------------------------------------- Severe shortness of breath: Status post recent thoracentesis.  Clinical examination reveals diminished breath sounds in the right lung base along with crackles. Recurrent pleural effusion could be related to heart failure.  I recommended follow-up with cardiology.  Plan:  1.  Weekly Retacrit injections 2.  Supportive care with blood transfusions as needed  Return to clinic weekly for labs and Retacrit 

## 2019-06-04 NOTE — Assessment & Plan Note (Deleted)
Patient has had longstanding anemia where the hemoglobin was between 10 to 12 g. Since April 2020 her hemoglobin has gotten worse and today it is at 6.6. During the same timeframe her creatinine got from 5.1-4.60 Q79 folic acid and iron studies were normal. 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  Current treatment:Retacritinjectionsweeklystarted7/31/2020 Bone marrow biopsy 04/08/2019: Normocellular bone marrow with trilineage hematopoiesis, absent iron stores, flow cytometry no abnormal B or T-cell clonal abnormalities noted.   IV iron therapy:  Venofer weekly x4 04/24/19-05/15/19  CT chest: Moderate pleural effusion, right breast changes Ultrasound-guided thoracentesis 05/15/2019: 600 cc of yellow fluid removed ----------------------------------------------------------- Severe shortness of breath: Status post recent thoracentesis.  Clinical examination reveals diminished breath sounds in the right lung base along with crackles. Recurrent pleural effusion could be related to heart failure.  I recommended follow-up with cardiology.  Plan:  1.  Weekly Retacrit injections 2.  Supportive care with blood transfusions as needed  Return to clinic weekly for labs and Retacrit

## 2019-06-04 NOTE — Telephone Encounter (Signed)
I talk with patient regarding schedule  

## 2019-06-05 LAB — NOVEL CORONAVIRUS, NAA (HOSP ORDER, SEND-OUT TO REF LAB; TAT 18-24 HRS): SARS-CoV-2, NAA: NOT DETECTED

## 2019-06-08 ENCOUNTER — Ambulatory Visit (HOSPITAL_COMMUNITY)
Admission: RE | Admit: 2019-06-08 | Discharge: 2019-06-08 | Disposition: A | Discharge status: Home / Self Care | Payer: Medicare Other | Source: Ambulatory Visit | Attending: Nurse Practitioner | Admitting: Nurse Practitioner

## 2019-06-08 ENCOUNTER — Ambulatory Visit (HOSPITAL_COMMUNITY)
Admission: RE | Admit: 2019-06-08 | Discharge: 2019-06-08 | Disposition: A | Discharge status: Home / Self Care | Payer: Medicare Other | Source: Ambulatory Visit | Attending: Radiology | Admitting: Radiology

## 2019-06-08 ENCOUNTER — Other Ambulatory Visit: Payer: Self-pay

## 2019-06-08 ENCOUNTER — Other Ambulatory Visit (HOSPITAL_COMMUNITY): Payer: Self-pay | Admitting: Radiology

## 2019-06-08 DIAGNOSIS — Z9889 Other specified postprocedural states: Secondary | ICD-10-CM

## 2019-06-08 DIAGNOSIS — J90 Pleural effusion, not elsewhere classified: Secondary | ICD-10-CM | POA: Diagnosis present

## 2019-06-08 HISTORY — PX: IR THORACENTESIS ASP PLEURAL SPACE W/IMG GUIDE: IMG5380

## 2019-06-08 LAB — PROTEIN, PLEURAL OR PERITONEAL FLUID: Total protein, fluid: <3 g/dL

## 2019-06-08 LAB — BODY FLUID CELL COUNT WITH DIFFERENTIAL
Eos, Fluid: 2 %
Lymphs, Fluid: 24 %
Monocyte-Macrophage-Serous Fluid: 69 % (ref 50–90)
Neutrophil Count, Fluid: 5 % (ref 0–25)
Other Cells, Fluid: 0 %
Total Nucleated Cell Count, Fluid: 245 cu mm (ref 0–1000)

## 2019-06-08 LAB — LACTATE DEHYDROGENASE, PLEURAL OR PERITONEAL FLUID: LD, Fluid: 77 U/L — ABNORMAL HIGH (ref 3–23)

## 2019-06-08 LAB — GLUCOSE, PLEURAL OR PERITONEAL FLUID: Glucose, Fluid: 177 mg/dL

## 2019-06-08 LAB — AMYLASE, PLEURAL OR PERITONEAL FLUID: Amylase, Fluid: 38 U/L

## 2019-06-08 MED ORDER — LIDOCAINE HCL 1 % IJ SOLN
INTRAMUSCULAR | Status: AC
  Filled 2019-06-08: qty 20

## 2019-06-08 MED ORDER — LIDOCAINE HCL (PF) 1 % IJ SOLN
INTRAMUSCULAR | Status: DC | PRN
  Administered 2019-06-08: 5 mL

## 2019-06-08 NOTE — CV Procedure (Signed)
Ultrasound-guided diagnostic and therapeutic right thoracentesis performed yielding 720 milliliters of straw colored fluid. No immediate complications.   Diagnostic fluid was sent to the lab for further analysis. Follow-up chest x-ray pending. EBL is none.

## 2019-06-09 LAB — ACID FAST SMEAR (AFB, MYCOBACTERIA): Acid Fast Smear: NEGATIVE

## 2019-06-09 LAB — GRAM STAIN

## 2019-06-09 LAB — CYTOLOGY - NON PAP

## 2019-06-11 ENCOUNTER — Other Ambulatory Visit: Payer: Self-pay

## 2019-06-11 ENCOUNTER — Inpatient Hospital Stay: Payer: Medicare Other

## 2019-06-11 ENCOUNTER — Ambulatory Visit (HOSPITAL_COMMUNITY): Payer: Medicare Other | Attending: Cardiovascular Disease

## 2019-06-11 ENCOUNTER — Telehealth: Payer: Self-pay

## 2019-06-11 DIAGNOSIS — N184 Chronic kidney disease, stage 4 (severe): Secondary | ICD-10-CM | POA: Diagnosis present

## 2019-06-11 DIAGNOSIS — I2510 Atherosclerotic heart disease of native coronary artery without angina pectoris: Secondary | ICD-10-CM | POA: Insufficient documentation

## 2019-06-11 DIAGNOSIS — I5032 Chronic diastolic (congestive) heart failure: Secondary | ICD-10-CM | POA: Insufficient documentation

## 2019-06-11 DIAGNOSIS — Z952 Presence of prosthetic heart valve: Secondary | ICD-10-CM | POA: Diagnosis not present

## 2019-06-11 NOTE — Telephone Encounter (Signed)
The patient has been notified of the Echo result and verbalized understanding.  All questions (if any) were answered. Frederik Schmidt, RN 06/11/2019 2:40 PM

## 2019-06-11 NOTE — Telephone Encounter (Signed)
-----   Message from [name], NP sent at 06/11/2019  2:25 PM EST ----- Ok to report. Her echo continues to show normal pumping function - this is good! Some stiffness noted - valves working ok. Would stay on current regimen.  See back as planned

## 2019-06-16 ENCOUNTER — Other Ambulatory Visit: Payer: Self-pay | Admitting: Surgery

## 2019-06-16 DIAGNOSIS — J90 Pleural effusion, not elsewhere classified: Secondary | ICD-10-CM

## 2019-06-17 ENCOUNTER — Encounter: Payer: Self-pay | Admitting: *Deleted

## 2019-06-17 ENCOUNTER — Institutional Professional Consult (permissible substitution) (INDEPENDENT_AMBULATORY_CARE_PROVIDER_SITE_OTHER): Payer: Medicare Other | Admitting: Surgery

## 2019-06-17 ENCOUNTER — Other Ambulatory Visit: Payer: Self-pay

## 2019-06-17 ENCOUNTER — Ambulatory Visit
Admission: RE | Admit: 2019-06-17 | Discharge: 2019-06-17 | Disposition: A | Discharge status: Home / Self Care | Payer: Medicare Other | Source: Ambulatory Visit | Attending: Surgery | Admitting: Surgery

## 2019-06-17 ENCOUNTER — Other Ambulatory Visit: Payer: Self-pay | Admitting: *Deleted

## 2019-06-17 VITALS — BP 146/77 | HR 70 | Temp 97.3°F | Resp 20 | Ht 65.0 in | Wt 146.0 lb

## 2019-06-17 DIAGNOSIS — J90 Pleural effusion, not elsewhere classified: Secondary | ICD-10-CM

## 2019-06-18 ENCOUNTER — Other Ambulatory Visit: Payer: Self-pay

## 2019-06-18 ENCOUNTER — Ambulatory Visit: Payer: Medicare Other | Admitting: Hematology and Oncology

## 2019-06-18 ENCOUNTER — Inpatient Hospital Stay: Payer: Medicare Other | Attending: Hematology and Oncology

## 2019-06-18 ENCOUNTER — Telehealth: Payer: Self-pay | Admitting: Hematology and Oncology

## 2019-06-18 ENCOUNTER — Ambulatory Visit: Payer: Medicare Other

## 2019-06-18 ENCOUNTER — Inpatient Hospital Stay: Payer: Medicare Other

## 2019-06-18 DIAGNOSIS — I129 Hypertensive chronic kidney disease with stage 1 through stage 4 chronic kidney disease, or unspecified chronic kidney disease: Secondary | ICD-10-CM | POA: Diagnosis present

## 2019-06-18 DIAGNOSIS — C50211 Malignant neoplasm of upper-inner quadrant of right female breast: Secondary | ICD-10-CM | POA: Insufficient documentation

## 2019-06-18 DIAGNOSIS — D638 Anemia in other chronic diseases classified elsewhere: Secondary | ICD-10-CM

## 2019-06-18 DIAGNOSIS — Z79811 Long term (current) use of aromatase inhibitors: Secondary | ICD-10-CM | POA: Diagnosis not present

## 2019-06-18 DIAGNOSIS — N183 Chronic kidney disease, stage 3 unspecified: Secondary | ICD-10-CM | POA: Insufficient documentation

## 2019-06-18 DIAGNOSIS — Z9221 Personal history of antineoplastic chemotherapy: Secondary | ICD-10-CM | POA: Insufficient documentation

## 2019-06-18 DIAGNOSIS — D631 Anemia in chronic kidney disease: Secondary | ICD-10-CM | POA: Insufficient documentation

## 2019-06-18 DIAGNOSIS — Z923 Personal history of irradiation: Secondary | ICD-10-CM | POA: Diagnosis not present

## 2019-06-18 DIAGNOSIS — J90 Pleural effusion, not elsewhere classified: Secondary | ICD-10-CM | POA: Diagnosis not present

## 2019-06-18 DIAGNOSIS — Z79899 Other long term (current) drug therapy: Secondary | ICD-10-CM | POA: Diagnosis not present

## 2019-06-18 DIAGNOSIS — Z7901 Long term (current) use of anticoagulants: Secondary | ICD-10-CM | POA: Diagnosis not present

## 2019-06-18 DIAGNOSIS — Z170 Estrogen receptor positive status [ER+]: Secondary | ICD-10-CM | POA: Diagnosis not present

## 2019-06-18 LAB — CBC WITH DIFFERENTIAL (CANCER CENTER ONLY)
Abs Immature Granulocytes: 0.01 10*3/uL (ref 0.00–0.07)
Basophils Absolute: 0.1 10*3/uL (ref 0.0–0.1)
Basophils Relative: 1 %
Eosinophils Absolute: 0.4 10*3/uL (ref 0.0–0.5)
Eosinophils Relative: 5 %
HCT: 37.2 % (ref 36.0–46.0)
Hemoglobin: 11.1 g/dL — ABNORMAL LOW (ref 12.0–15.0)
Immature Granulocytes: 0 %
Lymphocytes Relative: 9 %
Lymphs Abs: 0.6 10*3/uL — ABNORMAL LOW (ref 0.7–4.0)
MCH: 29.7 pg (ref 26.0–34.0)
MCHC: 29.8 g/dL — ABNORMAL LOW (ref 30.0–36.0)
MCV: 99.5 fL (ref 80.0–100.0)
Monocytes Absolute: 0.8 10*3/uL (ref 0.1–1.0)
Monocytes Relative: 12 %
Neutro Abs: 5.2 10*3/uL (ref 1.7–7.7)
Neutrophils Relative %: 73 %
Platelet Count: 154 10*3/uL (ref 150–400)
RBC: 3.74 MIL/uL — ABNORMAL LOW (ref 3.87–5.11)
RDW: 17.2 % — ABNORMAL HIGH (ref 11.5–15.5)
WBC Count: 7.1 10*3/uL (ref 4.0–10.5)
nRBC: 0 % (ref 0.0–0.2)

## 2019-06-18 LAB — SAMPLE TO BLOOD BANK

## 2019-06-18 MED ORDER — EPOETIN ALFA-EPBX 40000 UNIT/ML IJ SOLN: 40000.0000 [IU] | Freq: Once | INTRAMUSCULAR | Status: DC

## 2019-06-18 NOTE — Telephone Encounter (Signed)
Scheduled appt per 1/7 sch message- unable to reach pt . Left  Message with appt date and time

## 2019-06-18 NOTE — Patient Instructions (Signed)

## 2019-06-18 NOTE — Progress Notes (Signed)
Retacrit held, Hgb 11.1 per parameters.

## 2019-06-19 ENCOUNTER — Encounter: Payer: Self-pay | Admitting: Surgery

## 2019-06-19 NOTE — Progress Notes (Signed)
Cardiothoracic Surgery Consultation  PCP is [name], DO Referring Provider is [name], NP  Chief Complaint  Patient presents with  . Pleural Effusion    consultation    HPI:  The patient is a 74 year old woman with a history of coronary artery disease status post coronary artery bypass graft surgery by me in 1998 and subsequently redo coronary artery bypass graft surgery and aortic valve replacement with a bioprosthetic valve by me in 4132, chronic diastolic congestive heart failure, stage IV chronic kidney disease, paroxysmal atrial fibrillation on amiodarone, hypertension, hyperlipidemia, chronic anemia and thrombocytopenia, breast cancer status post right breast lumpectomy and radiation therapy in 2017, and recurrent right pleural effusion requiring thoracentesis in 09/2018 and 05/2019.  She was placed on diuretics with worsening of her renal function and orthostasis.  She developed worsening anemia with a hemoglobin down to 6 and was seen by hematology with concern for MDS and has been on treatment for her anemia.  She had a recent mammogram that was abnormal and had multiple repeat biopsies that were negative.  She had a CT scan of the chest on 05/06/2019 that showed a moderate right pleural effusion which is increased from her prior CT scan in October 2020.  This was associated with right lower lobe atelectasis.  There is also a 1.2 x 2.4 cm lesion along the inferior aspect of the right breast/chest wall which is felt to be fat necrosis and scarring.  The cytology from her most recent thoracentesis on 06/08/2019 was negative.  She had 600 cc of yellow serous fluid removed on 05/15/2019 and then 720 cc removed on 06/08/2019.  She now feels like the effusion is increasing.  She has shortness of breath with exertion and orthopnea.  She said that her symptoms are tolerable at this time but she is afraid they are going to get worse.   Past Medical History:  Diagnosis Date  .  Abnormally small mouth   . Allergic rhinitis 10/14/2009   Qualifier: Diagnosis of  By: Lamonte Sakai MD, Rose Fillers   Overview:  Overview:  Qualifier: Diagnosis of  By: Lamonte Sakai MD, Rose Fillers  Last Assessment & Plan:  Please continue Xyzal and Nasacort as you have been using them  . Anemia   . Asthma 05/12/2009   10/12/2014 p extensive coaching HFA effectiveness =    75% s spacer    Overview:  Overview:  10/12/2014 p extensive coaching HFA effectiveness =    75% s spacer   Last Assessment & Plan:  Please continue Symbicort 2 puffs twice a day. Remember to rinse and gargle after taking this medication. Take albuterol 2 puffs up to every 4 hours if needed for shortness of breath.  Follow with Dr Lamonte Sakai in 6 month  . Bilateral carotid artery stenosis    a. mild 1-39% by duplex 10/2017, due 2021.  . Breast cancer ([place]) 01/18/2016   right breast  . CAD (coronary artery disease) 01/24/2011   a. s/p CABG 1998 with redo 2009.  Marland Kitchen Chemotherapy induced nausea and vomiting 02/24/2016  . 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 Chronic diastolic CHF (congestive heart failure) ([place]) 09/24/2013  . CKD (chronic kidney disease), stage IV ([place]) 09/24/2013  . Closed fracture of head of left humerus 09/09/2017  . Complication of anesthesia   . Essential hypertension 05/12/2009   Qualifier: Diagnosis of  By: Lamonte Sakai MD, Rose Fillers   . Gallstones   . GERD (gastroesophageal reflux disease)   .  Glaucoma   . H/O atrial tachycardia 05/12/2009   Qualifier: History of  By: Lamonte Sakai MD, Rose Fillers   Overview:  Overview:  Qualifier: History of  By: Lamonte Sakai MD, Rose Fillers  Last Assessment & Plan:  There was no evidence of this on her cardiac monitor. I would recommend watchful waiting.   Marland Kitchen History of non-ST elevation myocardial infarction (NSTEMI)    Sept 2014--  thought to be type II HTN w/ LHC without infarct related artery and patent grafts  . History of radiation therapy  05/24/16-07/26/16   right breast 50.4 Gy in 28 fractions, right breast boost 10 Gy in 5 fractions  . Hyperlipidemia   . Hypertension    Resolved.  . Iron deficiency anemia   . Mania ([place]) 03/05/2016  . Moderate persistent asthma    pulmologist-  Dr. Malvin Johns  . Osteomyelitis of toe of left foot ([place]) 04/06/2016  . Osteopenia of multiple sites 10/19/2015  . PAF (paroxysmal atrial fibrillation) ([place])   . Personal history of chemotherapy 2017  . Personal history of radiation therapy 2017  . PONV (postoperative nausea and vomiting)   . Port catheter in place 02/17/2016  . Psoriasis    right leg  . Renal calculus, right   . S/P AVR    prosthesis valve placement 2009 at same time re-do CABG  . Seizure-like activity ([place]) 09/01/2017  . Sensorineural hearing loss (SNHL) of both ears 01/06/2016  . Stroke [facility])    residual rt hearing loss  . Syncope 09/06/2017  . Type 2 diabetes mellitus ([place])    monitored by dr Dwyane Dee    Past Surgical History:  Procedure Laterality Date  . AORTIC VALVE REPLACEMENT  2009   [facility]done same time is [facility])  . BREAST LUMPECTOMY Right 01/18/2016  . BREAST LUMPECTOMY WITH RADIOACTIVE SEED AND SENTINEL LYMPH NODE BIOPSY Right 01/18/2016   Procedure: RIGHT BREAST LUMPECTOMY WITH RADIOACTIVE SEED AND SENTINEL LYMPH NODE BIOPSY;  Surgeon: DAlphonsa Overall MD;  [place]  Service: General;  Laterality: Right;  . CARDIAC CATHETERIZATION  03/23/2008   Pre-redo CABG: L main OK, LAD (T), CFX (T), OM1 99%, RCA (T), LIMA-LAD OK, SVG-OM(?3) OK w/ little florw to OM2, SVG-RCA OK. EF NL  . CARPAL TUNNEL RELEASE    . CHOLECYSTECTOMY N/A 07/07/2018   Procedure: LAPAROSCOPIC CHOLECYSTECTOMY WITH INTRAOPERATIVE CHOLANGIOGRAM ERAS PATHWAY;  Surgeon: NAlphonsa Overall MD;  Location: [facility];  Service: General;  Laterality: N/A;  . COLONOSCOPY     around 2015. Possibly with [facility]   . CORONARY ARTERY BYPASS GRAFT  1998 &  re-do 2009   Had LIMA to DX/LAD,  SVG to 2 marginal branches and [facility]; SVG to 3rd OM and PD at time of redo  . CYSTOSCOPY W/ URETERAL STENT PLACEMENT Right 12/20/2014   Procedure: CYSTOSCOPY WITH RETROGRADE PYELOGRAM/URETERAL STENT PLACEMENT;  Surgeon: PCleon Gustin MD;  [facility]  Service: Urology;  Laterality: Right;  . ESOPHAGOGASTRODUODENOSCOPY     many years ago per patient   . ESOPHAGOGASTRODUODENOSCOPY ENDOSCOPY  06/17/2018  . EYE SURGERY Bilateral    cataracts  . HOLMIUM LASER APPLICATION Right 71/61/0960  Procedure:  HOLMIUM LASER LITHOTRIPSY;  Surgeon: PCleon Gustin MD;  [facility]  Service: Urology;  Laterality: Right;  . IR THORACENTESIS ASP PLEURAL SPACE W/IMG GUIDE  10/03/2018  . IR THORACENTESIS ASP PLEURAL SPACE W/IMG GUIDE  06/08/2019  . LEFT HEART CATHETERIZATION  WITH CORONARY/GRAFT ANGIOGRAM N/A 02/23/2013   Procedure: LEFT HEART CATHETERIZATION WITH Beatrix Fetters;  Surgeon: Blane Ohara, MD;  [facility] C[facility] LAB;  Service: Cardiovascular;  Laterality: N/A;  . LOOP RECORDER INSERTION N/A 08/30/2017   Procedure: LOOP RECORDER INSERTION;  Surgeon: Evans Lance, MD;  Location: [place] CV LAB;  Service: Cardiovascular;  Laterality: N/A;  . PORTACATH PLACEMENT Left 01/18/2016   Procedure: INSERTION PORT-A-CATH;  Surgeon: Alphonsa Overall, MD;  Location: [place];  Service: General;  Laterality: Left;  . portacath removal    . TONSILLECTOMY    . TRANSTHORACIC ECHOCARDIOGRAM  02-24-2013      mild LVH,  ef 50-55%/  AV bioprosthesis was present with very mild stenosis and no regurg., mean grandient 57mHg, peak grandient 259mg /  mild MR/  mild LAE and RAE/  moderate TR  . TUBAL LIGATION      Family History  Problem Relation Age of Onset  . Heart disease Father   . Heart failure Father   . Diabetes Maternal Grandmother   . Heart disease Maternal Grandmother   . Diabetes Son   . Healthy Brother        #1  . Heart  attack Brother        #2  . Heart disease Brother        #2  . Colon cancer Neg Hx   . Esophageal cancer Neg Hx     Social History Social History   Tobacco Use  . Smoking status: Never Smoker  . Smokeless tobacco: Never Used  Substance Use Topics  . Alcohol use: No  . Drug use: No    Current Outpatient Medications  Medication Sig Dispense Refill  . acetaminophen (TYLENOL) 325 MG tablet Take 2 tablets (650 mg total) by mouth every 6 (six) hours as needed for mild pain (or Fever >/= 101).    . Marland Kitchenlbuterol (PROVENTIL HFA;VENTOLIN HFA) 108 (90 Base) MCG/ACT inhaler Inhale 2 puffs into the lungs every 6 (six) hours as needed for wheezing or shortness of breath.    . Marland Kitchenmiodarone (PACERONE) 200 MG tablet Take 1 tablet (200 mg total) by mouth daily. 90 tablet 3  . apixaban (ELIQUIS) 5 MG TABS tablet Take 1 tablet (5 mg total) by mouth 2 (two) times daily. 180 tablet 2  . Blood Glucose Monitoring Suppl (ACCU-CHEK GUIDE ME) w/Device KIT Use accu chek meter to check blood sugar three times daily. 1 kit 0  . budesonide-formoterol (SYMBICORT) 160-4.5 MCG/ACT inhaler INHALE TWO PUFFS INTO THE LUNGS TWICE DAILY (Patient taking differently: Inhale 2 puffs into the lungs 2 (two) times daily. ) 3 Inhaler 1  . doxycycline (DORYX) 100 MG EC tablet Take 1 tablet (100 mg total) by mouth 2 (two) times daily. (Patient not taking: Reported on 06/18/2019) 14 tablet 0  . epoetin alfa-epbx (RETACRIT) 4045038NIT/ML injection Inject 40,000 Units into the skin every 7 (seven) days.     . Marland Kitchenxemestane (AROMASIN) 25 MG tablet TAKE ONE TABLET BY MOUTH DAILY AFTER BREAKFAST (Patient taking differently: Take 25 mg by mouth daily after breakfast. ) 90 tablet 3  . ezetimibe (ZETIA) 10 MG tablet TAKE ONE TABLET BY MOUTH EVERY DAY (Patient taking differently: Take 10 mg by mouth daily. ) 90 tablet 3  . famotidine (PEPCID) 20 MG tablet Take 1 tablet (20 mg total) by mouth 2 (two) times daily. One after supper 60 tablet 11  .  furosemide (LASIX) 20 MG tablet Take 20 mg every other day. (Patient  taking differently: Take 20 mg by mouth daily. ) 30 tablet 2  . glucose blood (ACCU-CHEK GUIDE) test strip Use to test blood sugar 3 times daily 150 each 3  . HUMALOG KWIKPEN 100 UNIT/ML KwikPen INJECT SUBCUTANEOUSLY 4 TO  6 UNITS 3 TIMES DAILY  BEFORE MEALS (Patient taking differently: Inject 3-6 Units into the skin 3 (three) times daily after meals. ) 30 mL 3  . insulin glargine (LANTUS) 100 UNIT/ML injection Inject 16 units under the skin once daily in the morning. (Patient taking differently: Inject 18 Units into the skin daily. ) 20 mL 3  . insulin lispro (HUMALOG) 100 UNIT/ML injection Inject 4-6 units under the skin three times daily before meals. (Patient not taking: Reported on 06/18/2019) 20 mL 3  . Insulin Pen Needle 32G X 4 MM MISC Use as instructed to inject insulin 4 times daily. 400 each 3  . loratadine (CLARITIN) 10 MG tablet Take 10 mg by mouth daily as needed for allergies.     . midodrine (PROAMATINE) 2.5 MG tablet TAKE ONE TABLET BY MOUTH THREE TIMES DAILY WITH MEALS (Patient taking differently: Take 2.5 mg by mouth 3 (three) times daily with meals. ) 270 tablet 3  . potassium chloride SA (K-DUR) 20 MEQ tablet Take 1 tablet (20 mEq total) by mouth daily. 90 tablet 3  . rosuvastatin (CRESTOR) 40 MG tablet Take 1 tablet (40 mg total) by mouth at bedtime. 90 tablet 3  . zolpidem (AMBIEN) 5 MG tablet TAKE ONE TABLET BY MOUTH AT BEDTIME AS NEEDED FOR SLEEP (Patient taking differently: Take 5 mg by mouth at bedtime as needed for sleep. ) 14 tablet 0  . metoprolol tartrate (LOPRESSOR) 25 MG tablet Take 1 tablet (25 mg total) by mouth 2 (two) times daily. 180 tablet 3  . nitroGLYCERIN (NITROSTAT) 0.4 MG SL tablet Place 1 tablet (0.4 mg total) under the tongue every 5 (five) minutes as needed for chest pain. 30 tablet 3   No current facility-administered medications for this visit.    Allergies  Allergen Reactions  .  Amoxicillin Rash and Other (See Comments)    Tolerates Cephalosporins Has patient had a PCN reaction causing immediate rash, facial/tongue/throat swelling, SOB or lightheadedness with hypotension: Yes Has patient had a PCN reaction causing severe rash involving mucus membranes or skin necrosis: Yes Has patient had a PCN reaction that required hospitalization No Has patient had a PCN reaction occurring within the last 10 years: No If all of the above answers are "NO", then may proceed with Cephalosporin use.   . Tape Other (See Comments)    Must use paper tape  . Aldactone [Spironolactone] Other (See Comments)    CKD/hypokalemia  . Imdur [Isosorbide Dinitrate] Other (See Comments)    Headache/severe hypotension/Syncope  . Arimidex [Anastrozole] Nausea Only  . Latex Itching and Other (See Comments)    (Dentist office)  . Tetracycline Rash    Review of Systems  Constitutional: Negative for activity change, chills and fever.  HENT: Negative.   Eyes: Negative.   Respiratory: Positive for shortness of breath.   Cardiovascular: Negative for chest pain and leg swelling.  Gastrointestinal: Negative.   Endocrine: Negative.   Genitourinary: Negative.   Musculoskeletal: Negative.   Allergic/Immunologic: Negative.   Neurological: Positive for dizziness. Negative for syncope.  Hematological: Negative.   Psychiatric/Behavioral: Negative.     BP (!) 146/77 (BP Location: Right Arm, Patient Position: Sitting, Cuff Size: Normal)   Pulse 70   Temp (!) 97.3 F (  36.3 C) (Skin)   Resp 20   Ht 5' 5"  (1.651 m)   Wt 146 lb (66.2 kg)   SpO2 94% Comment: RA  BMI 24.30 kg/m  Physical Exam Exam conducted with a chaperone present.  Constitutional:      Appearance: Normal appearance. She is obese.  HENT:     Head: Normocephalic and atraumatic.  Eyes:     General: No scleral icterus.    Extraocular Movements: Extraocular movements intact.     Conjunctiva/sclera: Conjunctivae normal.      Pupils: Pupils are equal, round, and reactive to light.  Cardiovascular:     Rate and Rhythm: Normal rate and regular rhythm.     Pulses: Normal pulses.     Heart sounds: Normal heart sounds. No murmur.  Pulmonary:     Effort: Pulmonary effort is normal.     Comments: Decreased breath sounds over the right lower lobe Chest:     Breasts:        Right: Skin change present.      Comments: There is some deformity, firm scarring or mass along the inferior mammary crease. Musculoskeletal:        General: No swelling.     Cervical back: Normal range of motion and neck supple.     Right lower leg: No edema.     Comments: Wearing bilateral compression stockings.  Lymphadenopathy:     Cervical: No cervical adenopathy.  Skin:    General: Skin is warm and dry.     Coloration: Skin is not jaundiced.  Neurological:     General: No focal deficit present.     Mental Status: She is alert and oriented to person, place, and time.  Psychiatric:        Mood and Affect: Mood normal.        Behavior: Behavior normal.        Thought Content: Thought content normal.        Judgment: Judgment normal.      Diagnostic Tests:   ECHOCARDIOGRAM REPORT       Patient Name:   VELITA QUIRK Date of Exam: 06/11/2019 Medical Rec #:  [id number]       Height:       65.0 in Accession #:    [id number]      Weight:       149.6 lb Date of Birth:  Jun 22, 1945        BSA:          1.75 m Patient Age:    67 years        BP:           150/68 mmHg Patient Gender: F               HR:           62 bpm. Exam Location:  [place]  Procedure: 2D Echo, Cardiac Doppler, Color Doppler and Strain Analysis  Indications:    I35.9 Aortic Valve Disorder   History:        Patient has prior history of Echocardiogram examinations, most                 recent 10/20/2018. Aortic Valve replacement-21 mm [employer]; Risk Factors:Hypertension, Dyslipidemia and Diabetes.                 Syncope.  Stroke. Asthma. Chronic kidney disease.  Coronary artery                 disease. NSTEMI.   Sonographer:    NaTashia Rodgers-Jones RDCS Referring Phys: [place]    1. Left ventricular ejection fraction, by visual estimation, is 60 to 65%. The left ventricle has normal function. Left ventricular septal wall thickness was normal. Normal left ventricular posterior wall thickness. There is no left ventricular  hypertrophy.  2. Elevated left ventricular end-diastolic pressure.  3. Left ventricular diastolic parameters are consistent with Grade II diastolic dysfunction (pseudonormalization).  4. Global right ventricle has normal systolic function.The right ventricular size is normal. No increase in right ventricular wall thickness.  5. Left atrial size was severely dilated.  6. Right atrial size was normal.  7. Mild mitral annular calcification.  8. Mild thickening of the anterior and posterior mitral valve leaflet(s).  9. The mitral valve is normal in structure. Mild mitral valve regurgitation. No evidence of mitral stenosis. 10. The tricuspid valve is normal in structure. 11. Aortic valve regurgitation is not visualized. No evidence of aortic valve sclerosis or stenosis. 12. The pulmonic valve was normal in structure. Pulmonic valve regurgitation is trivial. 13. Normal pulmonary artery systolic pressure. 14. The inferior vena cava is normal in size with greater than 50% respiratory variability, suggesting right atrial pressure of 3 mmHg.  FINDINGS  Left Ventricle: Left ventricular ejection fraction, by visual estimation, is 60 to 65%. The left ventricle has normal function. The left ventricle is not well visualized. Normal left ventricular posterior wall thickness. There is no left ventricular  hypertrophy. Left ventricular diastolic parameters are consistent with Grade II diastolic dysfunction (pseudonormalization). Elevated left ventricular end-diastolic  pressure.  Right Ventricle: The right ventricular size is normal. No increase in right ventricular wall thickness. Global RV systolic function is has normal systolic function. The tricuspid regurgitant velocity is 2.20 m/s, and with an assumed right atrial pressure  of 10 mmHg, the estimated right ventricular systolic pressure is normal at 29.3 mmHg.  Left Atrium: Left atrial size was severely dilated.  Right Atrium: Right atrial size was normal in size  Pericardium: There is no evidence of pericardial effusion.  Mitral Valve: The mitral valve is normal in structure. There is mild thickening of the anterior and posterior mitral valve leaflet(s). Mild mitral annular calcification. Mild mitral valve regurgitation. No evidence of mitral valve stenosis by  observation.  Tricuspid Valve: The tricuspid valve is normal in structure. Tricuspid valve regurgitation is trivial.  Aortic Valve: The aortic valve has been repaired/replaced. Aortic valve regurgitation is not visualized. The aortic valve is structurally normal, with no evidence of sclerosis or stenosis. Aortic valve mean gradient measures 13.0 mmHg. Aortic valve peak  gradient measures 20.2 mmHg. Bioprosthetic aortic valve valve is present in the aortic position. Bioprosthetic aortic valve gradient mildly increased. Mean gradient 14 mmHg, compared with 18 mmHg 10/2018.  Pulmonic Valve: The pulmonic valve was normal in structure. Pulmonic valve regurgitation is trivial. Pulmonic regurgitation is trivial.  Aorta: The aortic root, ascending aorta and aortic arch are all structurally normal, with no evidence of dilitation or obstruction.  Venous: The inferior vena cava is normal in size with greater than 50% respiratory variability, suggesting right atrial pressure of 3 mmHg.  IAS/Shunts: No atrial level shunt detected by color flow Doppler. There is no evidence of a patent foramen ovale. No ventricular septal defect is seen or  detected. There is no evidence of an atrial septal defect.  LEFT VENTRICLE PLAX 2D LVIDd:         4.70 cm Diastology LVIDs:         2.90 cm LV e' lateral:   7.51 cm/s LV PW:         0.90 cm LV E/e' lateral: 14.1 LV IVS:        0.90 cm LV e' medial:    5.44 cm/s LV SV:         70 ml   LV E/e' medial:  19.5 LV SV Index:   39.53                        2D Longitudinal Strain                        2D Strain GLS (A2C):   -20.2 %                        2D Strain GLS (A3C):   -20.4 %                        2D Strain GLS (A4C):   -18.6 %                        2D Strain GLS Avg:     -19.8 %  RIGHT VENTRICLE RV Basal diam:  4.00 cm RV S prime:     10.60 cm/s TAPSE (M-mode): 2.0 cm  LEFT ATRIUM             Index       RIGHT ATRIUM           Index LA diam:        4.60 cm 2.63 cm/m  RA Area:     12.50 cm LA Vol (A2C):   82.9 ml 47.41 ml/m RA Volume:   28.10 ml  16.07 ml/m LA Vol (A4C):   56.0 ml 32.03 ml/m LA Biplane Vol: 71.0 ml 40.61 ml/m  AORTIC VALVE AV Vmax:           224.75 cm/s AV Vmean:          172.500 cm/s AV VTI:            0.581 m AV Peak Grad:      20.2 mmHg AV Mean Grad:      13.0 mmHg LVOT Vmax:         105.00 cm/s LVOT Vmean:        75.900 cm/s LVOT VTI:          0.274 m LVOT/AV VTI ratio: 0.47   AORTA Ao Root diam: 3.40 cm Ao Asc diam:  3.60 cm  MITRAL VALVE                         TRICUSPID VALVE MV Area (PHT): 3.12 cm              TR Peak grad:   19.3 mmHg MV PHT:        70.47 msec            TR Vmax:        223.00 cm/s MV Decel Time: 243 msec MV E velocity: 106.00 cm/s 103 cm/s  SHUNTS MV A velocity: 74.90 cm/s  70.3 cm/s Systemic VTI: 0.27 m MV E/A ratio:  1.42  1.5    Skeet Latch MD Electronically signed by Skeet Latch MD Signature Date/Time: 06/11/2019/1:45:54 PM   CLINICAL DATA:  Follow-up recurrent pleural effusion.  EXAM: CHEST - 2 VIEW  COMPARISON:  06/08/2019  FINDINGS: Previous median sternotomy and  CABG. The left chest remains clear. On the right, there is a persistent small to moderate right effusion, similar to the study of 12/28 on the frontal projection. Mild volume loss at the right base secondary to that. Upper chest appears clear. Aortic atherosclerotic calcification is noted. Loop recorder in place. Previous aortic valve replacement.  IMPRESSION: Persistent small to moderate right effusion layering dependently with mild right base atelectasis. Similar appearance on the frontal view compared to the study of 06/08/2019. Effusion is smaller than was seen on 06/01/2019.   Electronically Signed   By: Nelson Chimes M.D.   On: 06/17/2019 11:05   Impression:  This 74 year old woman has a recurrent right pleural effusion with right lower lobe atelectasis causing exertional shortness of breath and orthopnea.  She has had thoracentesis on 05/15/2019 and 06/08/2019 and now has recurrent effusion that is increasing and symptomatic.  Cytology was negative for malignancy.  She does have diastolic dysfunction on echocardiogram and this effusion may be due to congestive heart failure combined with her stage IV chronic kidney disease.  I think the best option at this point is to place a right Pleurx catheter and perform talc pleurodesis.  I would recommend sending the fluid again for cytology at that time given her history of breast cancer.  I discussed the operative procedure with the patient including alternatives, benefits, and risks including but not limited to bleeding, infection, pneumothorax, and recurrent pleural effusion.  She understands and agrees to proceed.  Plan:  She will be scheduled for right Pleurx catheter insertion next Friday which will give her time to accumulate a little more fluid.  She will discontinue her Eliquis 48 hours preoperatively.  I spent 30 minutes performing this consultation and > 50% of this time was spent face to face counseling and coordinating the care  of this patient's recurrent right pleural effusion.   Gaye Pollack, MD [facility] [phone]

## 2019-06-22 ENCOUNTER — Ambulatory Visit (INDEPENDENT_AMBULATORY_CARE_PROVIDER_SITE_OTHER): Payer: Medicare Other | Admitting: *Deleted

## 2019-06-22 DIAGNOSIS — R55 Syncope and collapse: Secondary | ICD-10-CM

## 2019-06-22 LAB — CUP PACEART REMOTE DEVICE CHECK
Date Time Interrogation Session: 20210111032603
Implantable Pulse Generator Implant Date: 20190322

## 2019-06-23 ENCOUNTER — Telehealth: Payer: Self-pay | Admitting: Hematology and Oncology

## 2019-06-23 ENCOUNTER — Encounter: Payer: Self-pay | Admitting: Hematology and Oncology

## 2019-06-23 NOTE — Progress Notes (Signed)
[place], [place] - [address] [place] [address] Phone: [phone] Fax: [phone]  [place], [facility] [address] [place] [address] [place] [address] Phone: [phone] Fax: [phone]      Your procedure is scheduled on June 26, 2019.  Report to [facility] Main Entrance "A" at 5:30 A.M., and check in at the Admitting office.  Call this number if you have problems the morning of surgery:  [phone]  Call [phone] if you have any questions prior to your surgery date Monday-Friday 8am-4pm    Remember:  Do not eat or drink after midnight the night before your surgery    Take these medicines the morning of surgery with A SIP OF WATER: amiodarone (PACERONE) budesonide-formoterol (SYMBICORT)  exemestane (AROMASIN) ezetimibe (ZETIA) famotidine (PEPCID)  metoprolol tartrate (LOPRESSOR) acetaminophen (TYLENOL) - as needed albuterol (PROVENTIL HFA;VENTOLIN HFA) - as needed loratadine (CLARITIN) - as needed loratadine (CLARITIN) - as needed  Follow your surgeon's instructions on when to stop Eliquis.  If no instructions were given by your surgeon then you will need to call the office to get those instructions.    As of today, STOP taking any Aspirin (unless otherwise instructed by your surgeon), Aleve, Naproxen, Ibuprofen, Motrin, Advil, Goody's, BC's, all herbal medications, fish oil, and all vitamins.   WHAT DO I DO ABOUT MY DIABETES MEDICATION?   Marland Kitchen Do not take oral diabetes medicines (pills) the morning of surgery.  . THE NIGHT BEFORE SURGERY DO NOT TAKE ANY INSULIN       . THE MORNING OF SURGERY, take ________9_____ units of ______LANTUS____insulin.  . The day of surgery, do not take other diabetes injectables, including Byetta (exenatide), Bydureon (exenatide ER), Victoza (liraglutide), or Trulicity (dulaglutide).  . If your CBG is greater than 220 mg/dL,  you may take  of your sliding scale (correction) dose of insulin.   HOW TO MANAGE YOUR DIABETES BEFORE AND AFTER SURGERY  Why is it important to control my blood sugar before and after surgery? . Improving blood sugar levels before and after surgery helps healing and can limit problems. . A way of improving blood sugar control is eating a healthy diet by: o  Eating less sugar and carbohydrates o  Increasing activity/exercise o  Talking with your doctor about reaching your blood sugar goals . High blood sugars (greater than 180 mg/dL) can raise your risk of infections and slow your recovery, so you will need to focus on controlling your diabetes during the weeks before surgery. . Make sure that the doctor who takes care of your diabetes knows about your planned surgery including the date and location.  How do I manage my blood sugar before surgery? . Check your blood sugar at least 4 times a day, starting 2 days before surgery, to make sure that the level is not too high or low. . Check your blood sugar the morning of your surgery when you wake up and every 2 hours until you get to the Short Stay unit. o If your blood sugar is less than 70 mg/dL, you will need to treat for low blood sugar: - Do not take insulin. - Treat a low blood sugar (less than 70 mg/dL) with  cup of clear juice (cranberry or apple), 4 glucose tablets, OR glucose gel. - Recheck blood sugar in 15 minutes after treatment (to make sure it is greater than  70 mg/dL). If your blood sugar is not greater than 70 mg/dL on recheck, call [phone] for further instructions. . Report your blood sugar to the short stay nurse when you get to Short Stay.  . If you are admitted to the hospital after surgery: o Your blood sugar will be checked by the staff and you will probably be given insulin after surgery (instead of oral diabetes medicines) to make sure you have good blood sugar levels. o The goal for blood sugar control after  surgery is 80-180 mg/dL.    The Morning of Surgery  Do not wear jewelry, make-up or nail polish.  Do not wear lotions, powders, or perfumes or deodorant  Do not shave 48 hours prior to surgery.    Do not bring valuables to the hospital.  [facility] is not responsible for any belongings or valuables.  If you are a smoker, DO NOT Smoke 24 hours prior to surgery  If you wear a CPAP at night please bring your mask, tubing, and machine the morning of surgery   Remember that you must have someone to transport you home after your surgery, and remain with you for 24 hours if you are discharged the same day.   Please bring cases for contacts, glasses, hearing aids, dentures or bridgework because it cannot be worn into surgery.    Leave your suitcase in the car.  After surgery it may be brought to your room.  For patients admitted to the hospital, discharge time will be determined by your treatment team.  Patients discharged the day of surgery will not be allowed to drive home.    Special instructions:   [place]- Preparing For Surgery  Before surgery, you can play an important role. Because skin is not sterile, your skin needs to be as free of germs as possible. You can reduce the number of germs on your skin by washing with CHG (chlorahexidine gluconate) Soap before surgery.  CHG is an antiseptic cleaner which kills germs and bonds with the skin to continue killing germs even after washing.    Oral Hygiene is also important to reduce your risk of infection.  Remember - BRUSH YOUR TEETH THE MORNING OF SURGERY WITH YOUR REGULAR TOOTHPASTE  Please do not use if you have an allergy to CHG or antibacterial soaps. If your skin becomes reddened/irritated stop using the CHG.  Do not shave (including legs and underarms) for at least 48 hours prior to first CHG shower. It is OK to shave your face.  Please follow these instructions carefully.   1. Shower the NIGHT BEFORE SURGERY and the  MORNING OF SURGERY with CHG Soap.   2. If you chose to wash your hair, wash your hair first as usual with your normal shampoo.  3. After you shampoo, rinse your hair and body thoroughly to remove the shampoo.  4. Use CHG as you would any other liquid soap. You can apply CHG directly to the skin and wash gently with a scrungie or a clean washcloth.   5. Apply the CHG Soap to your body ONLY FROM THE NECK DOWN.  Do not use on open wounds or open sores. Avoid contact with your eyes, ears, mouth and genitals (private parts). Wash Face and genitals (private parts)  with your normal soap.   6. Wash thoroughly, paying special attention to the area where your surgery will be performed.  7. Thoroughly rinse your body with warm water from the neck down.  8. DO  NOT shower/wash with your normal soap after using and rinsing off the CHG Soap.  9. Pat yourself dry with a CLEAN TOWEL.  10. Wear CLEAN PAJAMAS to bed the night before surgery, wear comfortable clothes the morning of surgery  11. Place CLEAN SHEETS on your bed the night of your first shower and DO NOT SLEEP WITH PETS.    Day of Surgery:  Please shower the morning of surgery with the CHG soap Do not apply any deodorants/lotions. Please wear clean clothes to the hospital/surgery center.   Remember to brush your teeth WITH YOUR REGULAR TOOTHPASTE.   Please read over the following fact sheets that you were given.

## 2019-06-23 NOTE — Telephone Encounter (Signed)
Returned patient's phone call regarding scheduling lab appointments for 01/21 and 01/28 injection appointments. Per patient's request appointments have been added.

## 2019-06-24 ENCOUNTER — Ambulatory Visit (HOSPITAL_COMMUNITY)
Admission: RE | Admit: 2019-06-24 | Discharge: 2019-06-24 | Disposition: A | Discharge status: Home / Self Care | Payer: Medicare Other | Source: Ambulatory Visit | Attending: Surgery | Admitting: Surgery

## 2019-06-24 ENCOUNTER — Encounter (HOSPITAL_COMMUNITY): Payer: Self-pay

## 2019-06-24 ENCOUNTER — Other Ambulatory Visit: Payer: Self-pay

## 2019-06-24 ENCOUNTER — Other Ambulatory Visit (HOSPITAL_COMMUNITY)
Admission: RE | Admit: 2019-06-24 | Discharge: 2019-06-24 | Disposition: A | Discharge status: Home / Self Care | Payer: Medicare Other | Source: Ambulatory Visit | Attending: Surgery | Admitting: Surgery

## 2019-06-24 ENCOUNTER — Encounter (HOSPITAL_COMMUNITY)
Admission: RE | Admit: 2019-06-24 | Discharge: 2019-06-24 | Disposition: A | Discharge status: Home / Self Care | Payer: Medicare Other | Source: Ambulatory Visit | Attending: Surgery | Admitting: Surgery

## 2019-06-24 DIAGNOSIS — J90 Pleural effusion, not elsewhere classified: Secondary | ICD-10-CM

## 2019-06-24 DIAGNOSIS — N184 Chronic kidney disease, stage 4 (severe): Secondary | ICD-10-CM | POA: Insufficient documentation

## 2019-06-24 DIAGNOSIS — Z951 Presence of aortocoronary bypass graft: Secondary | ICD-10-CM | POA: Insufficient documentation

## 2019-06-24 DIAGNOSIS — I480 Paroxysmal atrial fibrillation: Secondary | ICD-10-CM | POA: Diagnosis not present

## 2019-06-24 DIAGNOSIS — Z20822 Contact with and (suspected) exposure to covid-19: Secondary | ICD-10-CM | POA: Diagnosis not present

## 2019-06-24 DIAGNOSIS — Z01812 Encounter for preprocedural laboratory examination: Secondary | ICD-10-CM | POA: Insufficient documentation

## 2019-06-24 DIAGNOSIS — Z79899 Other long term (current) drug therapy: Secondary | ICD-10-CM | POA: Diagnosis not present

## 2019-06-24 DIAGNOSIS — I130 Hypertensive heart and chronic kidney disease with heart failure and stage 1 through stage 4 chronic kidney disease, or unspecified chronic kidney disease: Secondary | ICD-10-CM | POA: Diagnosis not present

## 2019-06-24 DIAGNOSIS — Z7901 Long term (current) use of anticoagulants: Secondary | ICD-10-CM | POA: Insufficient documentation

## 2019-06-24 DIAGNOSIS — I5032 Chronic diastolic (congestive) heart failure: Secondary | ICD-10-CM | POA: Diagnosis not present

## 2019-06-24 HISTORY — DX: Personal history of urinary calculi: Z87.442

## 2019-06-24 HISTORY — DX: Cardiac arrhythmia, unspecified: I49.9

## 2019-06-24 HISTORY — DX: Cardiac murmur, unspecified: R01.1

## 2019-06-24 LAB — CBC
HCT: 37.8 % (ref 36.0–46.0)
Hemoglobin: 11.3 g/dL — ABNORMAL LOW (ref 12.0–15.0)
MCH: 30 pg (ref 26.0–34.0)
MCHC: 29.9 g/dL — ABNORMAL LOW (ref 30.0–36.0)
MCV: 100.3 fL — ABNORMAL HIGH (ref 80.0–100.0)
Platelets: 157 10*3/uL (ref 150–400)
RBC: 3.77 MIL/uL — ABNORMAL LOW (ref 3.87–5.11)
RDW: 16.6 % — ABNORMAL HIGH (ref 11.5–15.5)
WBC: 7.2 10*3/uL (ref 4.0–10.5)
nRBC: 0 % (ref 0.0–0.2)

## 2019-06-24 LAB — APTT: aPTT: 34 seconds (ref 24–36)

## 2019-06-24 LAB — COMPREHENSIVE METABOLIC PANEL
ALT: 39 U/L (ref 0–44)
AST: 61 U/L — ABNORMAL HIGH (ref 15–41)
Albumin: 2.5 g/dL — ABNORMAL LOW (ref 3.5–5.0)
Alkaline Phosphatase: 74 U/L (ref 38–126)
Anion gap: 8 (ref 5–15)
BUN: 31 mg/dL — ABNORMAL HIGH (ref 8–23)
CO2: 28 mmol/L (ref 22–32)
Calcium: 9 mg/dL (ref 8.9–10.3)
Chloride: 104 mmol/L (ref 98–111)
Creatinine, Ser: 1.95 mg/dL — ABNORMAL HIGH (ref 0.44–1.00)
GFR calc Af Amer: 29 mL/min — ABNORMAL LOW (ref >60)
GFR calc non Af Amer: 25 mL/min — ABNORMAL LOW (ref >60)
Glucose, Bld: 209 mg/dL — ABNORMAL HIGH (ref 70–99)
Potassium: 3.7 mmol/L (ref 3.5–5.1)
Sodium: 140 mmol/L (ref 135–145)
Total Bilirubin: 0.8 mg/dL (ref 0.3–1.2)
Total Protein: 6.5 g/dL (ref 6.5–8.1)

## 2019-06-24 LAB — PROTIME-INR
INR: 1.4 — ABNORMAL HIGH (ref 0.8–1.2)
Prothrombin Time: 17.3 seconds — ABNORMAL HIGH (ref 11.4–15.2)

## 2019-06-24 LAB — SURGICAL PCR SCREEN
MRSA, PCR: NEGATIVE
Staphylococcus aureus: NEGATIVE

## 2019-06-24 LAB — SARS CORONAVIRUS 2 (TAT 6-24 HRS): SARS Coronavirus 2: NEGATIVE

## 2019-06-24 LAB — GLUCOSE, CAPILLARY: Glucose-Capillary: 197 mg/dL — ABNORMAL HIGH (ref 70–99)

## 2019-06-24 NOTE — Progress Notes (Signed)
PCP:  [name], DO Cardiologist:  Truitt Merle, NP  EKG:  04/21/19 CXR:  06/24/19 ECHO:  06/11/19 Stress Test:  Denies Cardiac Cath:  03/23/2008  Fasting Blood Sugar-  112-200 Checks Blood Sugar__3-4_ times a day  Covid test 06/24/19  Anesthesia Review:  Yes, loop recorder.  Last day Eliquis 06/23/19  Patient denies shortness of breath, fever, cough, and chest pain at PAT appointment.  Patient verbalized understanding of instructions provided today at the PAT appointment.  Patient asked to review instructions at home and day of surgery.

## 2019-06-25 ENCOUNTER — Inpatient Hospital Stay: Payer: Medicare Other

## 2019-06-25 ENCOUNTER — Other Ambulatory Visit: Payer: Self-pay

## 2019-06-25 ENCOUNTER — Other Ambulatory Visit: Payer: Self-pay | Admitting: Hematology and Oncology

## 2019-06-25 VITALS — BP 128/72 | HR 68 | Temp 98.2°F | Resp 18

## 2019-06-25 DIAGNOSIS — I129 Hypertensive chronic kidney disease with stage 1 through stage 4 chronic kidney disease, or unspecified chronic kidney disease: Secondary | ICD-10-CM | POA: Diagnosis not present

## 2019-06-25 DIAGNOSIS — D638 Anemia in other chronic diseases classified elsewhere: Secondary | ICD-10-CM

## 2019-06-25 LAB — CBC WITH DIFFERENTIAL (CANCER CENTER ONLY)
Abs Immature Granulocytes: 0.01 10*3/uL (ref 0.00–0.07)
Basophils Absolute: 0.1 10*3/uL (ref 0.0–0.1)
Basophils Relative: 1 %
Eosinophils Absolute: 0.4 10*3/uL (ref 0.0–0.5)
Eosinophils Relative: 5 %
HCT: 33.9 % — ABNORMAL LOW (ref 36.0–46.0)
Hemoglobin: 10.3 g/dL — ABNORMAL LOW (ref 12.0–15.0)
Immature Granulocytes: 0 %
Lymphocytes Relative: 9 %
Lymphs Abs: 0.6 10*3/uL — ABNORMAL LOW (ref 0.7–4.0)
MCH: 29.7 pg (ref 26.0–34.0)
MCHC: 30.4 g/dL (ref 30.0–36.0)
MCV: 97.7 fL (ref 80.0–100.0)
Monocytes Absolute: 0.7 10*3/uL (ref 0.1–1.0)
Monocytes Relative: 11 %
Neutro Abs: 5 10*3/uL (ref 1.7–7.7)
Neutrophils Relative %: 74 %
Platelet Count: 130 10*3/uL — ABNORMAL LOW (ref 150–400)
RBC: 3.47 MIL/uL — ABNORMAL LOW (ref 3.87–5.11)
RDW: 16.7 % — ABNORMAL HIGH (ref 11.5–15.5)
WBC Count: 6.7 10*3/uL (ref 4.0–10.5)
nRBC: 0 % (ref 0.0–0.2)

## 2019-06-25 LAB — SAMPLE TO BLOOD BANK

## 2019-06-25 MED ORDER — EPOETIN ALFA-EPBX 40000 UNIT/ML IJ SOLN
INTRAMUSCULAR | Status: AC
  Filled 2019-06-25: qty 1

## 2019-06-25 MED ORDER — EPOETIN ALFA-EPBX 40000 UNIT/ML IJ SOLN
40000.0000 [IU] | Freq: Once | INTRAMUSCULAR | Status: AC
  Administered 2019-06-25: 14:00:00 40000 [IU] via SUBCUTANEOUS

## 2019-06-25 NOTE — Progress Notes (Signed)
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 Last seen 06/02/19 and discussed recurrent pleural effusion - was referred to Dr. Cyndia Bent at that time to discuss Pleurex insertion. Updated echo also ordered, done 06/11/19 and showed EF 60-65%, grade 2 dd, no significant valvular abnormalities.   Recurrent right pleural effusion requiring thoracentesis in 09/2018 and 05/2019.  She was placed on diuretics with worsening of her renal function and orthostasis.  She developed worsening anemia with a hemoglobin down to 6 and was seen by hematology with concern for MDS and has been on treatment for her anemia. She had a CT scan of the chest on 05/06/2019 that showed a moderate right pleural effusion which is increased from her prior CT scan in October 2020.  This was associated with right lower lobe atelectasis. She had 600 cc of yellow serous fluid removed on 05/15/2019 and then 720 cc removed on 06/08/2019.  She has shortness of breath with exertion and orthopnea.   Hx of right breast cancer treated with lumpectomy,adjuvant chemotherapy,radiation followed by Dr. Lindi Adie. Also following chronic anemia, pt on Retacrit and IV iron. She received one unit of PRBC on 05/23/19. Hemoglobin 11.3 on preop labs.   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 Creatinine 1.95 on preop labs (recent baseline ~2.0-2.3).  Preop labs otherwise notable for mildly elevated AST 61 (review show history of similar), and mildly elevated PT/INR 17.3/1.4.  EKG 04/21/19: Sinus rhythm with 1st degree AV block. Rate 62. Inferior infarct, age undetermined.   TTE 06/11/19: 1. Left ventricular ejection fraction, by visual estimation, is 60 to 65%. The left ventricle has normal function. Left ventricular septal wall thickness was normal. Normal left ventricular posterior wall thickness. There is no left ventricular   hypertrophy.  2. Elevated left ventricular end-diastolic pressure.  3. Left ventricular diastolic parameters are consistent with Grade II diastolic dysfunction (pseudonormalization).  4. Global right ventricle has normal systolic function.The right ventricular size is normal. No increase in right ventricular wall thickness.  5. Left atrial size was severely dilated.  6. Right atrial size was normal.  7. Mild mitral annular calcification.  8. Mild thickening of the anterior and posterior mitral valve leaflet(s).  9. The mitral valve is normal in structure. Mild mitral valve regurgitation. No evidence of mitral stenosis. 10. The tricuspid valve is normal in structure. 11. Aortic valve regurgitation is not visualized. No evidence of aortic valve sclerosis or stenosis. 12. The pulmonic valve was normal in structure. Pulmonic valve regurgitation is trivial. 13. Normal pulmonary artery systolic pressure. 14. The inferior vena cava is normal in size with greater than 50% respiratory variability, suggesting right atrial pressure of 3 mmHg.  Wynonia Musty [facility] Short Stay Center/Anesthesiology Phone [phone] 06/25/2019 9:18 AM

## 2019-06-25 NOTE — Patient Instructions (Signed)

## 2019-06-25 NOTE — Anesthesia Preprocedure Evaluation (Addendum)
Anesthesia Evaluation  Patient identified by MRN, date of birth, ID band Patient awake    Reviewed: Allergy & Precautions, NPO status , Patient's Chart, lab work & pertinent test results  History of Anesthesia Complications (+) PONV and history of anesthetic complications  Airway Mallampati: I  TM Distance: >3 FB Neck ROM: Full    Dental  (+) Teeth Intact, Dental Advisory Given   Pulmonary asthma ,    breath sounds clear to auscultation       Cardiovascular hypertension, Pt. on home beta blockers + CAD and +CHF  + dysrhythmias  Rhythm:Regular Rate:Normal     Neuro/Psych Seizures -,  PSYCHIATRIC DISORDERS  Neuromuscular disease CVA    GI/Hepatic Neg liver ROS, GERD  Medicated,  Endo/Other  diabetes, Type 2, Insulin Dependent  Renal/GU      Musculoskeletal   Abdominal Normal abdominal exam  (+)   Peds  Hematology   Anesthesia Other Findings   Reproductive/Obstetrics                           Anesthesia Physical Anesthesia Plan  ASA: III  Anesthesia Plan: MAC   Post-op Pain Management:    Induction: Intravenous  PONV Risk Score and Plan: Treatment may vary due to age or medical condition, Ondansetron and Propofol infusion  Airway Management Planned: Natural Airway  Additional Equipment: None  Intra-op Plan:   Post-operative Plan:   Informed Consent: I have reviewed the patients History and Physical, chart, labs and discussed the procedure including the risks, benefits and alternatives for the proposed anesthesia with the patient or authorized representative who has indicated his/her understanding and acceptance.       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 Last seen 06/02/19 and discussed recurrent pleural  effusion - was referred to Dr. Cyndia Bent at that time to discuss Pleurex insertion. Updated echo also ordered, done 06/11/19 and showed EF 60-65%, grade 2 dd, no significant valvular abnormalities.   Recurrent right pleural effusion requiring thoracentesis in 09/2018 and 05/2019.  She was placed on diuretics with worsening of her renal function and orthostasis.  She developed worsening anemia with a hemoglobin down to 6 and was seen by hematology with concern for MDS and has been on treatment for her anemia. She had a CT scan of the chest on 05/06/2019 that showed a moderate right pleural effusion which is increased from her prior CT scan in October 2020.  This was associated with right lower lobe atelectasis. She had 600 cc of yellow serous fluid removed on 05/15/2019 and then 720 cc removed on 06/08/2019.  She has shortness of breath with exertion and orthopnea.   Hx of right breast cancer treated with lumpectomy,adjuvant chemotherapy,radiation followed by Dr. Lindi Adie. Also following chronic anemia, pt on Retacrit and IV iron. She received one unit of PRBC on 05/23/19. Hemoglobin 11.3 on preop labs.   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 Creatinine 1.95 on preop labs (recent baseline ~2.0-2.3).  Preop labs otherwise notable for mildly elevated AST 61 (review show history of similar), and mildly elevated PT/INR 17.3/1.4.  EKG 04/21/19: Sinus rhythm with 1st degree AV block. Rate 62. Inferior infarct, age undetermined.   TTE 06/11/19: 1. Left ventricular ejection fraction, by visual estimation, is 60 to 65%. The left ventricle has normal function. Left ventricular septal wall  thickness was normal. Normal left ventricular posterior wall thickness. There is no left ventricular  hypertrophy.  2. Elevated left ventricular end-diastolic pressure.  3. Left ventricular diastolic parameters are consistent with Grade II diastolic dysfunction (pseudonormalization).  4. Global right ventricle has normal systolic  function.The right ventricular size is normal. No increase in right ventricular wall thickness.  5. Left atrial size was severely dilated.  6. Right atrial size was normal.  7. Mild mitral annular calcification.  8. Mild thickening of the anterior and posterior mitral valve leaflet(s).  9. The mitral valve is normal in structure. Mild mitral valve regurgitation. No evidence of mitral stenosis. 10. The tricuspid valve is normal in structure. 11. Aortic valve regurgitation is not visualized. No evidence of aortic valve sclerosis or stenosis. 12. The pulmonic valve was normal in structure. Pulmonic valve regurgitation is trivial. 13. Normal pulmonary artery systolic pressure. 14. The inferior vena cava is normal in size with greater than 50% respiratory variability, suggesting right atrial pressure of 3 mmHg. )      Anesthesia Quick Evaluation

## 2019-06-26 ENCOUNTER — Ambulatory Visit (HOSPITAL_COMMUNITY)
Admission: RE | Admit: 2019-06-26 | Discharge: 2019-06-26 | Disposition: A | Discharge status: Home / Self Care | Payer: Medicare Other | Attending: Surgery | Admitting: Surgery

## 2019-06-26 ENCOUNTER — Ambulatory Visit (HOSPITAL_COMMUNITY): Payer: Medicare Other | Admitting: Certified Registered Nurse Anesthetist

## 2019-06-26 ENCOUNTER — Other Ambulatory Visit: Payer: Self-pay

## 2019-06-26 ENCOUNTER — Ambulatory Visit (HOSPITAL_COMMUNITY): Payer: Medicare Other

## 2019-06-26 ENCOUNTER — Encounter (HOSPITAL_COMMUNITY)
Admission: RE | Disposition: A | Discharge status: Home / Self Care | Payer: Self-pay | Source: Home / Self Care | Attending: Surgery

## 2019-06-26 ENCOUNTER — Ambulatory Visit (HOSPITAL_COMMUNITY): Payer: Medicare Other | Admitting: Physician Assistant

## 2019-06-26 ENCOUNTER — Encounter (HOSPITAL_COMMUNITY): Payer: Self-pay | Admitting: Surgery

## 2019-06-26 DIAGNOSIS — Z853 Personal history of malignant neoplasm of breast: Secondary | ICD-10-CM | POA: Diagnosis not present

## 2019-06-26 DIAGNOSIS — I130 Hypertensive heart and chronic kidney disease with heart failure and stage 1 through stage 4 chronic kidney disease, or unspecified chronic kidney disease: Secondary | ICD-10-CM | POA: Diagnosis not present

## 2019-06-26 DIAGNOSIS — K219 Gastro-esophageal reflux disease without esophagitis: Secondary | ICD-10-CM | POA: Diagnosis not present

## 2019-06-26 DIAGNOSIS — Z794 Long term (current) use of insulin: Secondary | ICD-10-CM | POA: Insufficient documentation

## 2019-06-26 DIAGNOSIS — Z951 Presence of aortocoronary bypass graft: Secondary | ICD-10-CM | POA: Diagnosis not present

## 2019-06-26 DIAGNOSIS — I5032 Chronic diastolic (congestive) heart failure: Secondary | ICD-10-CM | POA: Insufficient documentation

## 2019-06-26 DIAGNOSIS — E1122 Type 2 diabetes mellitus with diabetic chronic kidney disease: Secondary | ICD-10-CM | POA: Insufficient documentation

## 2019-06-26 DIAGNOSIS — I252 Old myocardial infarction: Secondary | ICD-10-CM | POA: Insufficient documentation

## 2019-06-26 DIAGNOSIS — J4540 Moderate persistent asthma, uncomplicated: Secondary | ICD-10-CM | POA: Diagnosis not present

## 2019-06-26 DIAGNOSIS — N184 Chronic kidney disease, stage 4 (severe): Secondary | ICD-10-CM | POA: Diagnosis not present

## 2019-06-26 DIAGNOSIS — Z79899 Other long term (current) drug therapy: Secondary | ICD-10-CM | POA: Diagnosis not present

## 2019-06-26 DIAGNOSIS — J90 Pleural effusion, not elsewhere classified: Secondary | ICD-10-CM | POA: Insufficient documentation

## 2019-06-26 DIAGNOSIS — I480 Paroxysmal atrial fibrillation: Secondary | ICD-10-CM | POA: Insufficient documentation

## 2019-06-26 DIAGNOSIS — I2510 Atherosclerotic heart disease of native coronary artery without angina pectoris: Secondary | ICD-10-CM | POA: Insufficient documentation

## 2019-06-26 DIAGNOSIS — Z7901 Long term (current) use of anticoagulants: Secondary | ICD-10-CM | POA: Diagnosis not present

## 2019-06-26 DIAGNOSIS — Z923 Personal history of irradiation: Secondary | ICD-10-CM | POA: Diagnosis not present

## 2019-06-26 DIAGNOSIS — Z8673 Personal history of transient ischemic attack (TIA), and cerebral infarction without residual deficits: Secondary | ICD-10-CM | POA: Insufficient documentation

## 2019-06-26 DIAGNOSIS — E785 Hyperlipidemia, unspecified: Secondary | ICD-10-CM | POA: Insufficient documentation

## 2019-06-26 DIAGNOSIS — Z7951 Long term (current) use of inhaled steroids: Secondary | ICD-10-CM | POA: Insufficient documentation

## 2019-06-26 DIAGNOSIS — Z953 Presence of xenogenic heart valve: Secondary | ICD-10-CM | POA: Diagnosis not present

## 2019-06-26 HISTORY — PX: TALC PLEURODESIS: SHX2506

## 2019-06-26 HISTORY — PX: CHEST TUBE INSERTION: SHX231

## 2019-06-26 LAB — GLUCOSE, CAPILLARY
Glucose-Capillary: 167 mg/dL — ABNORMAL HIGH (ref 70–99)
Glucose-Capillary: 184 mg/dL — ABNORMAL HIGH (ref 70–99)

## 2019-06-26 SURGERY — INSERTION, PLEURAL DRAINAGE CATHETER
Anesthesia: Monitor Anesthesia Care | Site: Chest | Laterality: Right

## 2019-06-26 MED ORDER — TALC (STERITALC) POWDER FOR INTRAPLEURAL USE
4.0000 g | Freq: Once | INTRAPLEURAL | Status: DC
  Filled 2019-06-26 (×2): qty 4

## 2019-06-26 MED ORDER — LIDOCAINE HCL 1 % IJ SOLN
INTRAMUSCULAR | Status: DC | PRN
  Administered 2019-06-26: 30 mL

## 2019-06-26 MED ORDER — LIDOCAINE HCL (CARDIAC) PF 100 MG/5ML IV SOSY
PREFILLED_SYRINGE | INTRAVENOUS | Status: DC | PRN
  Administered 2019-06-26: 60 mg via INTRATRACHEAL

## 2019-06-26 MED ORDER — OXYCODONE HCL 5 MG PO TABS
ORAL_TABLET | ORAL | Status: AC
  Filled 2019-06-26: qty 1

## 2019-06-26 MED ORDER — FENTANYL CITRATE (PF) 100 MCG/2ML IJ SOLN: 25.0000 ug | INTRAMUSCULAR | Status: DC | PRN

## 2019-06-26 MED ORDER — ONDANSETRON HCL 4 MG/2ML IJ SOLN
INTRAMUSCULAR | Status: AC
  Filled 2019-06-26: qty 2

## 2019-06-26 MED ORDER — PHENYLEPHRINE 40 MCG/ML (10ML) SYRINGE FOR IV PUSH (FOR BLOOD PRESSURE SUPPORT): PREFILLED_SYRINGE | INTRAVENOUS | Status: DC | PRN

## 2019-06-26 MED ORDER — OXYCODONE HCL 5 MG PO TABS
5.0000 mg | ORAL_TABLET | Freq: Four times a day (QID) | ORAL | Status: DC | PRN
  Administered 2019-06-26: 5 mg via ORAL

## 2019-06-26 MED ORDER — PROPOFOL 10 MG/ML IV BOLUS
INTRAVENOUS | Status: AC
  Filled 2019-06-26: qty 20

## 2019-06-26 MED ORDER — PHENYLEPHRINE HCL-NACL 10-0.9 MG/250ML-% IV SOLN
INTRAVENOUS | Status: DC | PRN
  Administered 2019-06-26: 10 ug/min via INTRAVENOUS

## 2019-06-26 MED ORDER — LACTATED RINGERS IV SOLN: INTRAVENOUS | Status: DC

## 2019-06-26 MED ORDER — LIDOCAINE 2% (20 MG/ML) 5 ML SYRINGE
INTRAMUSCULAR | Status: AC
  Filled 2019-06-26: qty 5

## 2019-06-26 MED ORDER — VANCOMYCIN HCL IN DEXTROSE 1-5 GM/200ML-% IV SOLN
1000.0000 mg | INTRAVENOUS | Status: AC
  Administered 2019-06-26: 1000 mg via INTRAVENOUS
  Filled 2019-06-26: qty 200

## 2019-06-26 MED ORDER — TALC 5 G PL SUSR
INTRAPLEURAL | Status: DC | PRN
  Administered 2019-06-26: 4 g via INTRAPLEURAL

## 2019-06-26 MED ORDER — ROCURONIUM BROMIDE 10 MG/ML (PF) SYRINGE
PREFILLED_SYRINGE | INTRAVENOUS | Status: AC
  Filled 2019-06-26: qty 10

## 2019-06-26 MED ORDER — FENTANYL CITRATE (PF) 250 MCG/5ML IJ SOLN
INTRAMUSCULAR | Status: AC
  Filled 2019-06-26: qty 5

## 2019-06-26 MED ORDER — MIDAZOLAM HCL 2 MG/2ML IJ SOLN
INTRAMUSCULAR | Status: AC
  Filled 2019-06-26: qty 2

## 2019-06-26 MED ORDER — OXYCODONE HCL 5 MG PO TABS: 5.0000 mg | ORAL_TABLET | Freq: Three times a day (TID) | ORAL | 0 refills | Status: DC | PRN

## 2019-06-26 MED ORDER — LIDOCAINE HCL (PF) 1 % IJ SOLN
INTRAMUSCULAR | Status: AC
  Filled 2019-06-26: qty 30

## 2019-06-26 MED ORDER — 0.9 % SODIUM CHLORIDE (POUR BTL) OPTIME
TOPICAL | Status: DC | PRN
  Administered 2019-06-26: 1000 mL

## 2019-06-26 MED ORDER — PROPOFOL 500 MG/50ML IV EMUL
INTRAVENOUS | Status: DC | PRN
  Administered 2019-06-26: 50 ug/kg/min via INTRAVENOUS

## 2019-06-26 MED ORDER — MIDAZOLAM HCL 5 MG/5ML IJ SOLN
INTRAMUSCULAR | Status: DC | PRN
  Administered 2019-06-26: .5 mg via INTRAVENOUS

## 2019-06-26 MED ORDER — FENTANYL CITRATE (PF) 100 MCG/2ML IJ SOLN
INTRAMUSCULAR | Status: DC | PRN
  Administered 2019-06-26: 50 ug via INTRAVENOUS
  Administered 2019-06-26: 25 ug via INTRAVENOUS

## 2019-06-26 MED ORDER — DEXAMETHASONE SODIUM PHOSPHATE 10 MG/ML IJ SOLN
INTRAMUSCULAR | Status: AC
  Filled 2019-06-26: qty 1

## 2019-06-26 SURGICAL SUPPLY — 33 items
BLADE CLIPPER SURG (BLADE) ×3 IMPLANT
BRUSH SCRUB EZ PLAIN DRY (MISCELLANEOUS) ×4 IMPLANT
CANISTER SUCT 3000ML PPV (MISCELLANEOUS) ×3 IMPLANT
COVER SURGICAL LIGHT HANDLE (MISCELLANEOUS) ×3 IMPLANT
DERMABOND ADVANCED (GAUZE/BANDAGES/DRESSINGS) ×1
DERMABOND ADVANCED .7 DNX12 (GAUZE/BANDAGES/DRESSINGS) ×2 IMPLANT
DRAPE C-ARM 42X72 X-RAY (DRAPES) ×3 IMPLANT
DRAPE LAPAROSCOPIC ABDOMINAL (DRAPES) ×3 IMPLANT
GLOVE BIOGEL PI IND STRL 6.5 (GLOVE) IMPLANT
GLOVE BIOGEL PI INDICATOR 6.5 (GLOVE) ×1
GLOVE EUDERMIC 7 POWDERFREE (GLOVE) ×3 IMPLANT
GLOVE SURG SS PI 6.5 STRL IVOR (GLOVE) ×1 IMPLANT
GLOVE SURG SS PI 7.0 STRL IVOR (GLOVE) ×1 IMPLANT
GOWN STRL REUS W/ TWL LRG LVL3 (GOWN DISPOSABLE) ×2 IMPLANT
GOWN STRL REUS W/ TWL XL LVL3 (GOWN DISPOSABLE) ×2 IMPLANT
GOWN STRL REUS W/TWL LRG LVL3 (GOWN DISPOSABLE) ×1
GOWN STRL REUS W/TWL XL LVL3 (GOWN DISPOSABLE) ×1
KIT BASIN OR (CUSTOM PROCEDURE TRAY) ×3 IMPLANT
KIT PLEURX DRAIN CATH 1000ML (MISCELLANEOUS) ×3 IMPLANT
KIT PLEURX DRAIN CATH 15.5FR (DRAIN) ×3 IMPLANT
KIT TURNOVER KIT B (KITS) ×3 IMPLANT
NS IRRIG 1000ML POUR BTL (IV SOLUTION) ×3 IMPLANT
PACK GENERAL/GYN (CUSTOM PROCEDURE TRAY) ×3 IMPLANT
PAD ARMBOARD 7.5X6 YLW CONV (MISCELLANEOUS) ×6 IMPLANT
SET DRAINAGE LINE (MISCELLANEOUS) IMPLANT
STOPCOCK 4 WAY LG BORE MALE ST (IV SETS) ×2 IMPLANT
SUT ETHILON 3 0 PS 1 (SUTURE) ×3 IMPLANT
SUT VIC AB 3-0 X1 27 (SUTURE) ×3 IMPLANT
SYR CONTROL 10ML LL (SYRINGE) ×3 IMPLANT
TOWEL GREEN STERILE (TOWEL DISPOSABLE) ×3 IMPLANT
TOWEL GREEN STERILE FF (TOWEL DISPOSABLE) ×3 IMPLANT
VALVE REPLACEMENT CAP (MISCELLANEOUS) IMPLANT
WATER STERILE IRR 1000ML POUR (IV SOLUTION) ×3 IMPLANT

## 2019-06-26 NOTE — H&P (Signed)
[address]       [place] [address]             [phone]      Cardiothoracic Surgery History and Physical   PCP is [name], DO  Referring Provider is [name], NP      Chief Complaint  Patient presents with  . Pleural Effusion       HPI:  The patient is a 74 year old woman with a history of coronary artery disease status post coronary artery bypass graft surgery by me in 1998 and subsequently redo coronary artery bypass graft surgery and aortic valve replacement with a bioprosthetic valve by me in 2595, chronic diastolic congestive heart failure, stage IV chronic kidney disease, paroxysmal atrial fibrillation on amiodarone, hypertension, hyperlipidemia, chronic anemia and thrombocytopenia, breast cancer status post right breast lumpectomy and radiation therapy in 2017, and recurrent right pleural effusion requiring thoracentesis in 09/2018 and 05/2019. She was placed on diuretics with worsening of her renal function and orthostasis. She developed worsening anemia with a hemoglobin down to 6 and was seen by hematology with concern for MDS and has been on treatment for her anemia. She had a recent mammogram that was abnormal and had multiple repeat biopsies that were negative. She had a CT scan of the chest on 05/06/2019 that showed a moderate right pleural effusion which is increased from her prior CT scan in October 2020. This was associated with right lower lobe atelectasis. There is also a 1.2 x 2.4 cm lesion along the inferior aspect of the right breast/chest wall which is felt to be fat necrosis and scarring. The cytology from her most recent thoracentesis on 06/08/2019 was negative. She had 600 cc of yellow serous fluid removed on 05/15/2019 and then 720 cc removed on 06/08/2019. She now feels like the effusion is increasing. She has shortness of breath with exertion and orthopnea. She said that her symptoms are tolerable at this time but she is  afraid they are going to get worse.       Past Medical History:  Diagnosis Date  . Abnormally small mouth   . Allergic rhinitis 10/14/2009   Qualifier: Diagnosis of By: Lamonte Sakai MD, Rose Fillers Overview: Overview: Qualifier: Diagnosis of By: Lamonte Sakai MD, Rose Fillers Last Assessment & Plan: Please continue Xyzal and Nasacort as you have been using them  . Anemia   . Asthma 05/12/2009   10/12/2014 p extensive coaching HFA effectiveness = 75% s spacer Overview: Overview: 10/12/2014 p extensive coaching HFA effectiveness = 75% s spacer Last Assessment & Plan: Please continue Symbicort 2 puffs twice a day. Remember to rinse and gargle after taking this medication. Take albuterol 2 puffs up to every 4 hours if needed for shortness of breath. Follow with Dr Lamonte Sakai in 6 month  . Bilateral carotid artery stenosis    a. mild 1-39% by duplex 10/2017, due 2021.  . Breast cancer ([place]) 01/18/2016   right breast  . CAD (coronary artery disease) 01/24/2011   a. s/p CABG 1998 with redo 2009.  Marland Kitchen Chemotherapy induced nausea and vomiting 02/24/2016  . 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 Chronic diastolic CHF (congestive heart failure) ([place]) 09/24/2013  . CKD (chronic kidney disease), stage IV ([place]) 09/24/2013  . Closed fracture of head of left humerus 09/09/2017  . Complication of anesthesia   . Essential hypertension 05/12/2009   Qualifier: Diagnosis of By: Lamonte Sakai MD, Rose Fillers   .  Gallstones   . GERD (gastroesophageal reflux disease)   . Glaucoma   . H/O atrial tachycardia 05/12/2009   Qualifier: History of By: Lamonte Sakai MD, Rose Fillers Overview: Overview: Qualifier: History of By: Lamonte Sakai MD, Rose Fillers Last Assessment & Plan: There was no evidence of this on her cardiac monitor. I would recommend watchful waiting.   Marland Kitchen History of non-ST elevation myocardial infarction (NSTEMI)    Sept 2014-- thought to be type II HTN w/ LHC without infarct related artery and  patent grafts  . History of radiation therapy 05/24/16-07/26/16   right breast 50.4 Gy in 28 fractions, right breast boost 10 Gy in 5 fractions  . Hyperlipidemia   . Hypertension    Resolved.  . Iron deficiency anemia   . Mania ([place]) 03/05/2016  . Moderate persistent asthma    pulmologist- Dr. Malvin Johns  . Osteomyelitis of toe of left foot ([place]) 04/06/2016  . Osteopenia of multiple sites 10/19/2015  . PAF (paroxysmal atrial fibrillation) ([place])   . Personal history of chemotherapy 2017  . Personal history of radiation therapy 2017  . PONV (postoperative nausea and vomiting)   . Port catheter in place 02/17/2016  . Psoriasis    right leg  . Renal calculus, right   . S/P AVR    prosthesis valve placement 2009 at same time re-do CABG  . Seizure-like activity ([place]) 09/01/2017  . Sensorineural hearing loss (SNHL) of both ears 01/06/2016  . Stroke [facility])    residual rt hearing loss  . Syncope 09/06/2017  . Type 2 diabetes mellitus ([place])    monitored by dr Dwyane Dee        Past Surgical History:  Procedure Laterality Date  . AORTIC VALVE REPLACEMENT  2009   [facility]done same time is CABG)  . BREAST LUMPECTOMY Right 01/18/2016  . BREAST LUMPECTOMY WITH RADIOACTIVE SEED AND SENTINEL LYMPH NODE BIOPSY Right 01/18/2016   Procedure: RIGHT BREAST LUMPECTOMY WITH RADIOACTIVE SEED AND SENTINEL LYMPH NODE BIOPSY; Surgeon: DAlphonsa Overall MD; [place] [facility] Service: General; Laterality: Right;  . CARDIAC CATHETERIZATION  03/23/2008   Pre-redo CABG: L main OK, LAD (T), CFX (T), OM1 99%, RCA (T), LIMA-LAD OK, SVG-OM(?3) OK w/ little florw to OM2, SVG-RCA OK. EF NL  . CARPAL TUNNEL RELEASE    . CHOLECYSTECTOMY N/A 07/07/2018   Procedure: LAPAROSCOPIC CHOLECYSTECTOMY WITH INTRAOPERATIVE CHOLANGIOGRAM ERAS PATHWAY; Surgeon: NAlphonsa Overall MD; Location: [facility]S; Service: General; Laterality: N/A;  . COLONOSCOPY     around 2015. Possibly with [facility]   . CORONARY ARTERY BYPASS GRAFT   1998 & re-do 2009   Had LIMA to DX/LAD, SVG to 2 marginal branches and [facility]; SVG to 3rd OM and PD at time of redo  . CYSTOSCOPY W/ URETERAL STENT PLACEMENT Right 12/20/2014   Procedure: CYSTOSCOPY WITH RETROGRADE PYELOGRAM/URETERAL STENT PLACEMENT; Surgeon: PCleon Gustin MD; [facility]; Laterality: Right;  . ESOPHAGOGASTRODUODENOSCOPY     many years ago per patient   . ESOPHAGOGASTRODUODENOSCOPY ENDOSCOPY  06/17/2018  . EYE SURGERY Bilateral    cataracts  . HOLMIUM LASER APPLICATION Right 73/81/8299  Procedure: HOLMIUM LASER LITHOTRIPSY; Surgeon: PCleon Gustin MD; [facility]; Laterality: Right;  . IR THORACENTESIS ASP PLEURAL SPACE W/IMG GUIDE  10/03/2018  . IR THORACENTESIS ASP PLEURAL SPACE W/IMG GUIDE  06/08/2019  . LEFT HEART CATHETERIZATION WITH CORONARY/GRAFT ANGIOGRAM N/A 02/23/2013   Procedure: LEFT HEART CATHETERIZATION WITH CORONARY/GRAFT  Cyril Loosen; Surgeon: Blane Ohara, MD; Location: [facility] CATH LAB; Service: Cardiovascular; Laterality: N/A;  . LOOP RECORDER INSERTION N/A 08/30/2017   Procedure: LOOP RECORDER INSERTION; Surgeon: Evans Lance, MD; Location: [place] CV LAB; Service: Cardiovascular; Laterality: N/A;  . PORTACATH PLACEMENT Left 01/18/2016   Procedure: INSERTION PORT-A-CATH; Surgeon: Alphonsa Overall, MD; Location: [place]; Service: General; Laterality: Left;  . portacath removal    . TONSILLECTOMY    . TRANSTHORACIC ECHOCARDIOGRAM  02-24-2013    mild LVH, ef 50-55%/ AV bioprosthesis was present with very mild stenosis and no regurg., mean grandient 22mHg, peak grandient 273mg / mild MR/ mild LAE and RAE/ moderate TR  . TUBAL LIGATION          Family History  Problem Relation Age of Onset  . Heart disease Father   . Heart failure Father   . Diabetes Maternal Grandmother   . Heart disease Maternal Grandmother   . Diabetes Son   . Healthy Brother     #1  . Heart attack Brother    #2  . Heart disease Brother    #2  . Colon cancer Neg Hx   . Esophageal cancer Neg Hx    Social History  Social History       Tobacco Use  . Smoking status: Never Smoker  . Smokeless tobacco: Never Used  Substance Use Topics  . Alcohol use: No  . Drug use: No         Current Outpatient Medications  Medication Sig Dispense Refill  . acetaminophen (TYLENOL) 325 MG tablet Take 2 tablets (650 mg total) by mouth every 6 (six) hours as needed for mild pain (or Fever >/= 101).    . Marland Kitchenlbuterol (PROVENTIL HFA;VENTOLIN HFA) 108 (90 Base) MCG/ACT inhaler Inhale 2 puffs into the lungs every 6 (six) hours as needed for wheezing or shortness of breath.    . Marland Kitchenmiodarone (PACERONE) 200 MG tablet Take 1 tablet (200 mg total) by mouth daily. 90 tablet 3  . apixaban (ELIQUIS) 5 MG TABS tablet Take 1 tablet (5 mg total) by mouth 2 (two) times daily. 180 tablet 2  . Blood Glucose Monitoring Suppl (ACCU-CHEK GUIDE ME) w/Device KIT Use accu chek meter to check blood sugar three times daily. 1 kit 0  . budesonide-formoterol (SYMBICORT) 160-4.5 MCG/ACT inhaler INHALE TWO PUFFS INTO THE LUNGS TWICE DAILY (Patient taking differently: Inhale 2 puffs into the lungs 2 (two) times daily. ) 3 Inhaler 1  . doxycycline (DORYX) 100 MG EC tablet Take 1 tablet (100 mg total) by mouth 2 (two) times daily. (Patient not taking: Reported on 06/18/2019) 14 tablet 0  . epoetin alfa-epbx (RETACRIT) 4029528NIT/ML injection Inject 40,000 Units into the skin every 7 (seven) days.     . Marland Kitchenxemestane (AROMASIN) 25 MG tablet TAKE ONE TABLET BY MOUTH DAILY AFTER BREAKFAST (Patient taking differently: Take 25 mg by mouth daily after breakfast. ) 90 tablet 3  . ezetimibe (ZETIA) 10 MG tablet TAKE ONE TABLET BY MOUTH EVERY DAY (Patient taking differently: Take 10 mg by mouth daily. ) 90 tablet 3  . famotidine (PEPCID) 20 MG tablet Take 1 tablet (20 mg total) by mouth 2 (two) times daily. One after supper 60 tablet  11  . furosemide (LASIX) 20 MG tablet Take 20 mg every other day. (Patient taking differently: Take 20 mg by mouth daily. ) 30 tablet 2  . glucose blood (ACCU-CHEK GUIDE) test strip Use to test blood sugar 3 times daily 150 each 3  .  HUMALOG KWIKPEN 100 UNIT/ML KwikPen INJECT SUBCUTANEOUSLY 4 TO 6 UNITS 3 TIMES DAILY BEFORE MEALS (Patient taking differently: Inject 3-6 Units into the skin 3 (three) times daily after meals. ) 30 mL 3  . insulin glargine (LANTUS) 100 UNIT/ML injection Inject 16 units under the skin once daily in the morning. (Patient taking differently: Inject 18 Units into the skin daily. ) 20 mL 3  . insulin lispro (HUMALOG) 100 UNIT/ML injection Inject 4-6 units under the skin three times daily before meals. (Patient not taking: Reported on 06/18/2019) 20 mL 3  . Insulin Pen Needle 32G X 4 MM MISC Use as instructed to inject insulin 4 times daily. 400 each 3  . loratadine (CLARITIN) 10 MG tablet Take 10 mg by mouth daily as needed for allergies.     . midodrine (PROAMATINE) 2.5 MG tablet TAKE ONE TABLET BY MOUTH THREE TIMES DAILY WITH MEALS (Patient taking differently: Take 2.5 mg by mouth 3 (three) times daily with meals. ) 270 tablet 3  . potassium chloride SA (K-DUR) 20 MEQ tablet Take 1 tablet (20 mEq total) by mouth daily. 90 tablet 3  . rosuvastatin (CRESTOR) 40 MG tablet Take 1 tablet (40 mg total) by mouth at bedtime. 90 tablet 3  . zolpidem (AMBIEN) 5 MG tablet TAKE ONE TABLET BY MOUTH AT BEDTIME AS NEEDED FOR SLEEP (Patient taking differently: Take 5 mg by mouth at bedtime as needed for sleep. ) 14 tablet 0  . metoprolol tartrate (LOPRESSOR) 25 MG tablet Take 1 tablet (25 mg total) by mouth 2 (two) times daily. 180 tablet 3  . nitroGLYCERIN (NITROSTAT) 0.4 MG SL tablet Place 1 tablet (0.4 mg total) under the tongue every 5 (five) minutes as needed for chest pain. 30 tablet 3   No current facility-administered medications for this visit.        Allergies  Allergen  Reactions  . Amoxicillin Rash and Other (See Comments)    Tolerates Cephalosporins  Has patient had a PCN reaction causing immediate rash, facial/tongue/throat swelling, SOB or lightheadedness with hypotension: Yes  Has patient had a PCN reaction causing severe rash involving mucus membranes or skin necrosis: Yes  Has patient had a PCN reaction that required hospitalization No  Has patient had a PCN reaction occurring within the last 10 years: No  If all of the above answers are "NO", then may proceed with Cephalosporin use.   . Tape Other (See Comments)    Must use paper tape  . Aldactone [Spironolactone] Other (See Comments)    CKD/hypokalemia  . Imdur [Isosorbide Dinitrate] Other (See Comments)    Headache/severe hypotension/Syncope  . Arimidex [Anastrozole] Nausea Only  . Latex Itching and Other (See Comments)    (Dentist office)  . Tetracycline Rash   Review of Systems  Constitutional: Negative for activity change, chills and fever.  HENT: Negative.  Eyes: Negative.  Respiratory: Positive for shortness of breath.  Cardiovascular: Negative for chest pain and leg swelling.  Gastrointestinal: Negative.  Endocrine: Negative.  Genitourinary: Negative.  Musculoskeletal: Negative.  Allergic/Immunologic: Negative.  Neurological: Positive for dizziness. Negative for syncope.  Hematological: Negative.  Psychiatric/Behavioral: Negative.   BP (!) 146/77 (BP Location: Right Arm, Patient Position: Sitting, Cuff Size: Normal)  Pulse 70  Temp (!) 97.3 F (36.3 C) (Skin)  Resp 20  Ht 5' 5"  (1.651 m)  Wt 146 lb (66.2 kg)  SpO2 94% Comment: RA  BMI 24.30 kg/m  Physical Exam  Exam conducted with a chaperone present.  Constitutional:  Appearance: Normal appearance. She is obese.  HENT:  Head: Normocephalic and atraumatic.  Eyes:  General: No scleral icterus. Extraocular Movements: Extraocular movements intact.  Conjunctiva/sclera: Conjunctivae normal.  Pupils: Pupils are equal,  round, and reactive to light.  Cardiovascular:  Rate and Rhythm: Normal rate and regular rhythm.  Pulses: Normal pulses.  Heart sounds: Normal heart sounds. No murmur.  Pulmonary:  Effort: Pulmonary effort is normal.  Comments: Decreased breath sounds over the right lower lobe Chest:  Breasts:  Right: Skin change present.   Comments: There is some deformity, firm scarring or mass along the inferior mammary crease. Musculoskeletal:  General: No swelling.  Cervical back: Normal range of motion and neck supple.  Right lower leg: No edema.  Comments: Wearing bilateral compression stockings.  Lymphadenopathy:  Cervical: No cervical adenopathy.  Skin:  General: Skin is warm and dry.  Coloration: Skin is not jaundiced.  Neurological:  General: No focal deficit present.  Mental Status: She is alert and oriented to person, place, and time.  Psychiatric:  Mood and Affect: Mood normal.  Behavior: Behavior normal.  Thought Content: Thought content normal.  Judgment: Judgment normal.   Diagnostic Tests:  ECHOCARDIOGRAM REPORT  Patient Name: Hannah [name] Date of Exam: 06/11/2019  Medical Rec #: [id number] Height: 65.0 in  Accession #: [id number] Weight: 149.6 lb  Date of Birth: [birth year] BSA: 1.75 m  Patient Age: 73 years BP: 150/68 mmHg  Patient Gender: F HR: 62 bpm.  Exam Location: [facility]  Procedure: 2D Echo, Cardiac Doppler, Color Doppler and Strain Analysis  Indications: I35.9 Aortic Valve Disorder  History: Patient has prior history of Echocardiogram examinations, most  recent 10/20/2018. Aortic Valve replacement-21 mm [employer]; Risk Factors:Hypertension, Dyslipidemia and Diabetes.  Syncope. Stroke. Asthma. Chronic kidney disease. Coronary artery  disease. NSTEMI.  Sonographer: NaTashia Rodgers-Jones RDCS  Referring Phys: [place]  1. Left ventricular ejection fraction, by visual estimation, is 60 to 65%. The left ventricle has  normal function. Left ventricular septal wall thickness was normal. Normal left ventricular posterior wall thickness. There is no left ventricular  hypertrophy.  2. Elevated left ventricular end-diastolic pressure.  3. Left ventricular diastolic parameters are consistent with Grade II diastolic dysfunction (pseudonormalization).  4. Global right ventricle has normal systolic function.The right ventricular size is normal. No increase in right ventricular wall thickness.  5. Left atrial size was severely dilated.  6. Right atrial size was normal.  7. Mild mitral annular calcification.  8. Mild thickening of the anterior and posterior mitral valve leaflet(s).  9. The mitral valve is normal in structure. Mild mitral valve regurgitation. No evidence of mitral stenosis.  10. The tricuspid valve is normal in structure.  11. Aortic valve regurgitation is not visualized. No evidence of aortic valve sclerosis or stenosis.  12. The pulmonic valve was normal in structure. Pulmonic valve regurgitation is trivial.  13. Normal pulmonary artery systolic pressure.  14. The inferior vena cava is normal in size with greater than 50% respiratory variability, suggesting right atrial pressure of 3 mmHg.  FINDINGS  Left Ventricle: Left ventricular ejection fraction, by visual estimation, is 60 to 65%. The left ventricle has normal function. The left ventricle is not well visualized. Normal left ventricular posterior wall thickness. There is no left ventricular  hypertrophy. Left ventricular diastolic parameters are consistent with Grade II diastolic dysfunction (pseudonormalization). Elevated left ventricular end-diastolic pressure.  Right Ventricle: The right ventricular size is normal. No increase in right  ventricular wall thickness. Global RV systolic function is has normal systolic function. The tricuspid regurgitant velocity is 2.20 m/s, and with an assumed right atrial pressure  of 10 mmHg, the estimated right  ventricular systolic pressure is normal at 29.3 mmHg.  Left Atrium: Left atrial size was severely dilated.  Right Atrium: Right atrial size was normal in size  Pericardium: There is no evidence of pericardial effusion.  Mitral Valve: The mitral valve is normal in structure. There is mild thickening of the anterior and posterior mitral valve leaflet(s). Mild mitral annular calcification. Mild mitral valve regurgitation. No evidence of mitral valve stenosis by  observation.  Tricuspid Valve: The tricuspid valve is normal in structure. Tricuspid valve regurgitation is trivial.  Aortic Valve: The aortic valve has been repaired/replaced. Aortic valve regurgitation is not visualized. The aortic valve is structurally normal, with no evidence of sclerosis or stenosis. Aortic valve mean gradient measures 13.0 mmHg. Aortic valve peak  gradient measures 20.2 mmHg. Bioprosthetic aortic valve valve is present in the aortic position. Bioprosthetic aortic valve gradient mildly increased. Mean gradient 14 mmHg, compared with 18 mmHg 10/2018.  Pulmonic Valve: The pulmonic valve was normal in structure. Pulmonic valve regurgitation is trivial. Pulmonic regurgitation is trivial.  Aorta: The aortic root, ascending aorta and aortic arch are all structurally normal, with no evidence of dilitation or obstruction.  Venous: The inferior vena cava is normal in size with greater than 50% respiratory variability, suggesting right atrial pressure of 3 mmHg.  IAS/Shunts: No atrial level shunt detected by color flow Doppler. There is no evidence of a patent foramen ovale. No ventricular septal defect is seen or detected. There is no evidence of an atrial septal defect.  LEFT VENTRICLE  PLAX 2D  LVIDd: 4.70 cm Diastology  LVIDs: 2.90 cm LV e' lateral: 7.51 cm/s  LV PW: 0.90 cm LV E/e' lateral: 14.1  LV IVS: 0.90 cm LV e' medial: 5.44 cm/s  LV SV: 70 ml LV E/e' medial: 19.5  LV SV Index: 39.53  2D Longitudinal Strain  2D Strain  GLS (A2C): -20.2 %  2D Strain GLS (A3C): -20.4 %  2D Strain GLS (A4C): -18.6 %  2D Strain GLS Avg: -19.8 %  RIGHT VENTRICLE  RV Basal diam: 4.00 cm  RV S prime: 10.60 cm/s  TAPSE (M-mode): 2.0 cm  LEFT ATRIUM Index RIGHT ATRIUM Index  LA diam: 4.60 cm 2.63 cm/m RA Area: 12.50 cm  LA Vol (A2C): 82.9 ml 47.41 ml/m RA Volume: 28.10 ml 16.07 ml/m  LA Vol (A4C): 56.0 ml 32.03 ml/m  LA Biplane Vol: 71.0 ml 40.61 ml/m  AORTIC VALVE  AV Vmax: 224.75 cm/s  AV Vmean: 172.500 cm/s  AV VTI: 0.581 m  AV Peak Grad: 20.2 mmHg  AV Mean Grad: 13.0 mmHg  LVOT Vmax: 105.00 cm/s  LVOT Vmean: 75.900 cm/s  LVOT VTI: 0.274 m  LVOT/AV VTI ratio: 0.47  AORTA  Ao Root diam: 3.40 cm  Ao Asc diam: 3.60 cm  MITRAL VALVE TRICUSPID VALVE  MV Area (PHT): 3.12 cm TR Peak grad: 19.3 mmHg  MV PHT: 70.47 msec TR Vmax: 223.00 cm/s  MV Decel Time: 243 msec  MV E velocity: 106.00 cm/s 103 cm/s SHUNTS  MV A velocity: 74.90 cm/s 70.3 cm/s Systemic VTI: 0.27 m  MV E/A ratio: 1.42 1.5  Skeet Latch MD  Electronically signed by Skeet Latch MD  Signature Date/Time: 06/11/2019/1:45:54 PM  CLINICAL DATA: Follow-up recurrent pleural effusion.  EXAM:  CHEST - 2 VIEW  COMPARISON:  06/08/2019  FINDINGS:  Previous median sternotomy and CABG. The left chest remains clear.  On the right, there is a persistent small to moderate right  effusion, similar to the study of 12/28 on the frontal projection.  Mild volume loss at the right base secondary to that. Upper chest  appears clear. Aortic atherosclerotic calcification is noted. Loop  recorder in place. Previous aortic valve replacement.  IMPRESSION:  Persistent small to moderate right effusion layering dependently  with mild right base atelectasis. Similar appearance on the frontal  view compared to the study of 06/08/2019. Effusion is smaller than  was seen on 06/01/2019.  Electronically Signed  By: Nelson Chimes M.D.  On: 06/17/2019 11:05  Impression:   This 74 year old woman has a recurrent right pleural effusion with right lower lobe atelectasis causing exertional shortness of breath and orthopnea. She has had thoracentesis on 05/15/2019 and 06/08/2019 and now has recurrent effusion that is increasing and symptomatic. Cytology was negative for malignancy. She does have diastolic dysfunction on echocardiogram and this effusion may be due to congestive heart failure combined with her stage IV chronic kidney disease. I think the best option at this point is to place a right Pleurx catheter and perform talc pleurodesis. I would recommend sending the fluid again for cytology at that time given her history of breast cancer. I discussed the operative procedure with the patient including alternatives, benefits, and risks including but not limited to bleeding, infection, pneumothorax, and recurrent pleural effusion. She understands and agrees to proceed.   Plan:   Right Pleurx catheter insertion   Gaye Pollack, MD  [facility]  [phone]

## 2019-06-26 NOTE — Anesthesia Postprocedure Evaluation (Signed)
Anesthesia Post Note  Patient: Hannah [name]  Procedure(s) Performed: INSERTION PLEURAL DRAINAGE CATHETER (Right Chest) TALC PLEURADESIS (N/A )     Patient location during evaluation: PACU Anesthesia Type: MAC Level of consciousness: awake and alert Pain management: pain level controlled Vital Signs Assessment: post-procedure vital signs reviewed and stable Respiratory status: spontaneous breathing, nonlabored ventilation, respiratory function stable and patient connected to nasal cannula oxygen Cardiovascular status: stable and blood pressure returned to baseline Postop Assessment: no apparent nausea or vomiting Anesthetic complications: no    Last Vitals:  Vitals:   06/26/19 0915 06/26/19 0930  BP: (!) 128/48 (!) 133/47  Pulse: 60 60  Resp: 17 17  Temp:  36.8 C  SpO2: 100% 98%    Last Pain:  Vitals:   06/26/19 0930  TempSrc:   PainSc: 2                  Effie Berkshire

## 2019-06-26 NOTE — Progress Notes (Signed)
CARDIOLOGY OFFICE NOTE  Date:  06/29/2019    Hannah [name] Date of Birth: 08-Aug-1945 Medical Record #[id number]  PCP:  [name], DO  Cardiologist:  Atilano Median   Chief Complaint  Patient presents with  . Follow-up    History of Present Illness: Hannah [name] is a 74 y.o. female who presents today for a follow up visit.  Seen for Dr. Lovena Le.Primarily follows with me.  She has a very complex/extensivehistory - this includes ahistory ofCAD s/p CABG (1998 with redo 2009 - Dr. Cyndia Bent), bioprosthetic AVR in 0370, chronic diastolic CHF, asthma, carotid artery disease (1-39% by duplex 10/2017, due 2021), CKD stage IV (Dr. Posey Pronto), PAF- has been on Multaq in the past and now on amiodarone, atrial tachycardia, breast CA, orthostasis,chronicanemia, thrombocytopenia, mild dilation of ascending aorta by echo 10/2018, recurrent syncope/orthostasis,recurrentright pleural effusion requiring thoracentesis4/2020 and 05/2019, HTN, GERD,&HLD.   I have followed her for many years - she has had periodic issues with volume overload, orthostasis and tachy palpitations.Last cath in 2014 - grafts patent. Myoview in 2015 without ischemia.  Over the past few months she has had more issues with her pleural effusion - diuretics caused worsening CKD/orthostasis - resulting in injury. She has gotten more anemic (down to 6) - back to hematology and now on therapy. Concern for MDS and has had bone marrow biopsy - was also needing colonoscopy due to profound anemia (HGB down to 6 which is highly unusual for Hannah [name]).Most recent abdominal ultrasound noted and concerning for liver abnormality. Recent mammogram also recently noted and abnormal - ended up havingmultiplerepeat biopsies that were negative.  Most recent course has been pretty eventful - she has hadto be placed back onlow dose diuretics for a recurring effusion. Pulmonary wished for me to dose her diuretics due to her fragile  nature with orthostasis/CKD.  Progressive failure to thrive. Has been back to the hospital in the interim - treated for CHF and pneumonia. Remainsmarkedlyanemic - needed colonoscopy. Has had abnormal mammogram with 4 subsequent negative biopsies. At one visit in September - she was coming in here and got dizzy/fell in the parking lot on the curb and had significant laceration to the back of her her head. ILR interrogation was ok at that time. Have added back Midodrine for orthostasis to prevent injury. She has been slow to recoverand make progress. Has seen nephrology who thought her kidney function was stable. Ended up having CT in place of colonoscopy per GI.  She has had recurrent effusion - referred to Dr. Cyndia Bent and she had Pleurex cath placement with talc just last week. Cytologies on prior taps have been negative so far. We got her echo updated last month as well - EF remains normal.   The patient does not have symptoms concerning for COVID-19 infection (fever, chills, cough, or new shortness of breath).   Comes in today. Here with [name] today. She had her Pleurex placed on Friday with talc - unable to drain today - lots of clots apparently - has been over to TCTS to have flushed - no CXR today due to the holiday. She admits she was pretty scared to do this - now feels much better just knowing that this will only probably be needed for a couple of weeks. Breathing is better. Taking just 20 mg of Lasix a day. Weight is stable. She stopped her narcotic - made her feel too loopy. Appetite still not great according to [place]. No falls. Little dizziness at  times.    Past Medical History:  Diagnosis Date  . Abnormally small mouth   . Allergic rhinitis 10/14/2009   Qualifier: Diagnosis of  By: Lamonte Sakai MD, Rose Fillers   Overview:  Overview:  Qualifier: Diagnosis of  By: Lamonte Sakai MD, Rose Fillers  Last Assessment & Plan:  Please continue Xyzal and Nasacort as you have been using them  . Anemia   . Asthma  05/12/2009   10/12/2014 p extensive coaching HFA effectiveness =    75% s spacer    Overview:  Overview:  10/12/2014 p extensive coaching HFA effectiveness =    75% s spacer   Last Assessment & Plan:  Please continue Symbicort 2 puffs twice a day. Remember to rinse and gargle after taking this medication. Take albuterol 2 puffs up to every 4 hours if needed for shortness of breath.  Follow with Dr Lamonte Sakai in 6 month  . Bilateral carotid artery stenosis    a. mild 1-39% by duplex 10/2017, due 2021.  . Breast cancer ([place]) 01/18/2016   right breast  . CAD (coronary artery disease) 01/24/2011   a. s/p CABG 1998 with redo 2009.  Marland Kitchen Chemotherapy induced nausea and vomiting 02/24/2016  . Chemotherapy-induced peripheral neuropathy ([place]) 04/27/2016  . 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 Chronic diastolic CHF (congestive heart failure) ([place]) 09/24/2013  . CKD (chronic kidney disease), stage IV ([place]) 09/24/2013  . Closed fracture of head of left humerus 09/09/2017  . Complication of anesthesia   . Dysrhythmia   . Essential hypertension 05/12/2009   Qualifier: Diagnosis of  By: Lamonte Sakai MD, Rose Fillers   . Gallstones   . GERD (gastroesophageal reflux disease)   . Glaucoma   . H/O atrial tachycardia 05/12/2009   Qualifier: History of  By: Lamonte Sakai MD, Rose Fillers   Overview:  Overview:  Qualifier: History of  By: Lamonte Sakai MD, Rose Fillers  Last Assessment & Plan:  There was no evidence of this on her cardiac monitor. I would recommend watchful waiting.   Marland Kitchen Heart murmur   . History of kidney stones   . History of non-ST elevation myocardial infarction (NSTEMI)    Sept 2014--  thought to be type II HTN w/ LHC without infarct related artery and patent grafts  . History of radiation therapy 05/24/16-07/26/16   right breast 50.4 Gy in 28 fractions, right breast boost 10 Gy in 5 fractions  . Hyperlipidemia   . Hypertension    Resolved.  . Iron deficiency anemia   . Mania ([place]) 03/05/2016  . Moderate persistent asthma     pulmologist-  Dr. Malvin Johns  . Osteomyelitis of toe of left foot ([place]) 04/06/2016  . Osteopenia of multiple sites 10/19/2015  . PAF (paroxysmal atrial fibrillation) ([place])   . Personal history of chemotherapy 2017  . Personal history of radiation therapy 2017  . PONV (postoperative nausea and vomiting)   . Port catheter in place 02/17/2016  . Psoriasis    right leg  . Renal calculus, right   . S/P AVR    prosthesis valve placement 2009 at same time re-do CABG  . Seizure-like activity ([place]) 09/01/2017  . Sensorineural hearing loss (SNHL) of both ears 01/06/2016  . Stroke [facility])    residual rt hearing loss  . Syncope 09/06/2017  . Type 2 diabetes mellitus ([place])    monitored by dr Dwyane Dee    Past Surgical History:  Procedure Laterality Date  . AORTIC VALVE REPLACEMENT  [facility]   [facility]  done same time is [facility])  . BREAST LUMPECTOMY Right 01/18/2016  . BREAST LUMPECTOMY WITH RADIOACTIVE SEED AND SENTINEL LYMPH NODE BIOPSY Right 01/18/2016   Procedure: RIGHT BREAST LUMPECTOMY WITH RADIOACTIVE SEED AND SENTINEL LYMPH NODE BIOPSY;  Surgeon: Alphonsa Overall, MD;  Location: [facility];  Service: General;  Laterality: Right;  . CARDIAC CATHETERIZATION  03/23/2008   Pre-redo CABG: L main OK, LAD (T), CFX (T), OM1 99%, RCA (T), LIMA-LAD OK, SVG-OM(?3) OK w/ little florw to OM2, SVG-RCA OK. EF NL  . CARPAL TUNNEL RELEASE    . CHEST TUBE INSERTION Right 06/26/2019   Procedure: INSERTION PLEURAL DRAINAGE CATHETER;  Surgeon: Gaye Pollack, MD;  Location: [place];  Service: Thoracic;  Laterality: Right;  . CHOLECYSTECTOMY N/A 07/07/2018   Procedure: LAPAROSCOPIC CHOLECYSTECTOMY WITH INTRAOPERATIVE CHOLANGIOGRAM ERAS PATHWAY;  Surgeon: Alphonsa Overall, MD;  Location: [facility];  Service: General;  Laterality: N/A;  . COLONOSCOPY     around 2015. Possibly with [facility]   . CORONARY ARTERY BYPASS GRAFT  1998 &  re-do 2009   Had LIMA to DX/LAD, SVG to 2 marginal branches and SVG to [facility] originally; SVG  to 3rd OM and PD at time of redo  . CYSTOSCOPY W/ URETERAL STENT PLACEMENT Right 12/20/2014   Procedure: CYSTOSCOPY WITH RETROGRADE PYELOGRAM/URETERAL STENT PLACEMENT;  Surgeon: Cleon Gustin, MD;  Location: [facility];  Service: Urology;  Laterality: Right;  . ESOPHAGOGASTRODUODENOSCOPY     many years ago per patient   . ESOPHAGOGASTRODUODENOSCOPY ENDOSCOPY  06/17/2018  . EYE SURGERY Bilateral    cataracts  . HOLMIUM LASER APPLICATION Right 09/24/8307   Procedure:  HOLMIUM LASER LITHOTRIPSY;  Surgeon: Cleon Gustin, MD;  [facility];  Service: Urology;  Laterality: Right;  . IR THORACENTESIS ASP PLEURAL SPACE W/IMG GUIDE  10/03/2018  . IR THORACENTESIS ASP PLEURAL SPACE W/IMG GUIDE  06/08/2019  . LEFT HEART CATHETERIZATION WITH CORONARY/GRAFT ANGIOGRAM N/A 02/23/2013   Procedure: LEFT HEART CATHETERIZATION WITH Beatrix Fetters;  Surgeon: Blane Ohara, MD;  Location: [facility] CATH LAB;  Service: Cardiovascular;  Laterality: N/A;  . LOOP RECORDER INSERTION N/A 08/30/2017   Procedure: LOOP RECORDER INSERTION;  Surgeon: Evans Lance, MD;  Location: [facility] CV LAB;  Service: Cardiovascular;  Laterality: N/A;  . PORTACATH PLACEMENT Left 01/18/2016   Procedure: INSERTION PORT-A-CATH;  Surgeon: Alphonsa Overall, MD;  Location: [facility];  Service: General;  Laterality: Left;  . portacath removal    . TALC PLEURODESIS N/A 06/26/2019   Procedure: Pietro Cassis;  Surgeon: Gaye Pollack, MD;  Location: [facility];  Service: Thoracic;  Laterality: N/A;  . TONSILLECTOMY    . TRANSTHORACIC ECHOCARDIOGRAM  02-24-2013      mild LVH,  ef 50-55%/  AV bioprosthesis was present with very mild stenosis and no regurg., mean grandient 20mHg, peak grandient 267mg /  mild MR/  mild LAE and RAE/  moderate TR  . TUBAL LIGATION       Medications: Current Meds  Medication Sig  . acetaminophen (TYLENOL) 325 MG tablet Take 2 tablets (650 mg total) by mouth every 6  (six) hours as needed for mild pain (or Fever >/= 101).  . Marland Kitchenlbuterol (PROVENTIL HFA;VENTOLIN HFA) 108 (90 Base) MCG/ACT inhaler Inhale 2 puffs into the lungs every 6 (six) hours as needed for wheezing or shortness of breath.  . Marland Kitchenmiodarone (PACERONE) 200 MG tablet Take 1 tablet (200 mg total) by mouth daily.  . Marland Kitchenpixaban (ELIQUIS) 5 MG TABS tablet  Take 1 tablet (5 mg total) by mouth 2 (two) times daily.  . Blood Glucose Monitoring Suppl (ACCU-CHEK GUIDE ME) w/Device KIT Use accu chek meter to check blood sugar three times daily.  . budesonide-formoterol (SYMBICORT) 160-4.5 MCG/ACT inhaler INHALE TWO PUFFS INTO THE LUNGS TWICE DAILY (Patient taking differently: Inhale 2 puffs into the lungs 2 (two) times daily. )  . epoetin alfa-epbx (RETACRIT) 50093 UNIT/ML injection Inject 40,000 Units into the skin every 7 (seven) days.   Marland Kitchen exemestane (AROMASIN) 25 MG tablet TAKE ONE TABLET BY MOUTH DAILY AFTER BREAKFAST (Patient taking differently: Take 25 mg by mouth daily after breakfast. )  . ezetimibe (ZETIA) 10 MG tablet TAKE ONE TABLET BY MOUTH EVERY DAY (Patient taking differently: Take 10 mg by mouth daily. )  . famotidine (PEPCID) 20 MG tablet Take 1 tablet (20 mg total) by mouth 2 (two) times daily. One after supper  . furosemide (LASIX) 20 MG tablet Take 20 mg every other day. (Patient taking differently: Take 20 mg by mouth daily. )  . glucose blood (ACCU-CHEK GUIDE) test strip Use to test blood sugar 3 times daily  . HUMALOG KWIKPEN 100 UNIT/ML KwikPen INJECT SUBCUTANEOUSLY 4 TO  6 UNITS 3 TIMES DAILY  BEFORE MEALS (Patient taking differently: Inject 3-6 Units into the skin 3 (three) times daily after meals. )  . insulin glargine (LANTUS) 100 UNIT/ML injection Inject 16 units under the skin once daily in the morning. (Patient taking differently: Inject 18 Units into the skin daily. )  . insulin lispro (HUMALOG) 100 UNIT/ML injection Inject 4-6 units under the skin three times daily before meals.  .  Insulin Pen Needle 32G X 4 MM MISC Use as instructed to inject insulin 4 times daily.  Marland Kitchen loratadine (CLARITIN) 10 MG tablet Take 10 mg by mouth daily as needed for allergies.   . metoprolol tartrate (LOPRESSOR) 25 MG tablet Take 1 tablet (25 mg total) by mouth 2 (two) times daily.  . midodrine (PROAMATINE) 2.5 MG tablet TAKE ONE TABLET BY MOUTH THREE TIMES DAILY WITH MEALS (Patient taking differently: Take 2.5 mg by mouth 3 (three) times daily with meals. )  . nitroGLYCERIN (NITROSTAT) 0.4 MG SL tablet Place 1 tablet (0.4 mg total) under the tongue every 5 (five) minutes as needed for chest pain.  Marland Kitchen oxyCODONE (ROXICODONE) 5 MG immediate release tablet Take 1 tablet (5 mg total) by mouth every 8 (eight) hours as needed.  . potassium chloride SA (K-DUR) 20 MEQ tablet Take 1 tablet (20 mEq total) by mouth daily.  . rosuvastatin (CRESTOR) 40 MG tablet Take 1 tablet (40 mg total) by mouth at bedtime.  Marland Kitchen zolpidem (AMBIEN) 5 MG tablet TAKE ONE TABLET BY MOUTH AT BEDTIME AS NEEDED FOR SLEEP (Patient taking differently: Take 5 mg by mouth at bedtime as needed for sleep. )     Allergies: Allergies  Allergen Reactions  . Amoxicillin Rash and Other (See Comments)    Tolerates Cephalosporins Has patient had a PCN reaction causing immediate rash, facial/tongue/throat swelling, SOB or lightheadedness with hypotension: Yes Has patient had a PCN reaction causing severe rash involving mucus membranes or skin necrosis: Yes Has patient had a PCN reaction that required hospitalization No Has patient had a PCN reaction occurring within the last 10 years: No If all of the above answers are "NO", then may proceed with Cephalosporin use.   . Tape Other (See Comments)    Must use paper tape  . Aldactone [Spironolactone] Other (See Comments)  CKD/hypokalemia  . Imdur [Isosorbide Dinitrate] Other (See Comments)    Headache/severe hypotension/Syncope  . Arimidex [Anastrozole] Nausea Only  . Latex Itching and  Other (See Comments)    (Dentist office)  . Tetracycline Rash    Social History: The patient  reports that she has never smoked. She has never used smokeless tobacco. She reports that she does not drink alcohol or use drugs.   Family History: The patient's family history includes Diabetes in her maternal grandmother and son; Healthy in her brother; Heart attack in her brother; Heart disease in her brother, father, and maternal grandmother; Heart failure in her father.   Review of Systems: Please see the history of present illness.   All other systems are reviewed and negative.   Physical Exam: VS:  BP (!) 112/42   Pulse 66   Ht 5' 5"  (1.651 m)   Wt 145 lb (65.8 kg)   LMP  (LMP Unknown)   BMI 24.13 kg/m  .  BMI Body mass index is 24.13 kg/m.  Wt Readings from Last 3 Encounters:  06/29/19 145 lb (65.8 kg)  06/29/19 144 lb (65.3 kg)  06/26/19 145 lb (65.8 kg)    General: Pleasant. Chronically ill. Alert and in no acute distress. Color is sallow.   HEENT: Normal.  Neck: Supple, no JVD, carotid bruits, or masses noted.  Cardiac: Regular rate and rhythm. Soft murmur noted. 1+ edema - she has her stockings on bilaterally.  Respiratory:  Lungs are clear to auscultation bilaterally with normal work of breathing.  GI: Soft and nontender.  MS: No deformity or atrophy. Gait and ROM intact.  Skin: Warm and dry. Color is normal.  Neuro:  Strength and sensation are intact and no gross focal deficits noted.  Psych: Alert, appropriate and with normal affect.   LABORATORY DATA:  EKG:  EKG is not ordered today.   Lab Results  Component Value Date   WBC 6.7 06/25/2019   HGB 10.3 (L) 06/25/2019   HCT 33.9 (L) 06/25/2019   PLT 130 (L) 06/25/2019   GLUCOSE 209 (H) 06/24/2019   CHOL 106 12/02/2018   TRIG 97 12/02/2018   HDL 34 (L) 12/02/2018   LDLDIRECT 53.0 10/08/2016   LDLCALC 53 12/02/2018   ALT 39 06/24/2019   AST 61 (H) 06/24/2019   NA 140 06/24/2019   K 3.7 06/24/2019   CL  104 06/24/2019   CREATININE 1.95 (H) 06/24/2019   BUN 31 (H) 06/24/2019   CO2 28 06/24/2019   TSH 3.040 04/21/2019   INR 1.4 (H) 06/24/2019   HGBA1C 5.6 02/09/2019   MICROALBUR 53.4 (H) 05/12/2018     BNP (last 3 results) Recent Labs    03/04/19 1429  BNP 273.4*    ProBNP (last 3 results) Recent Labs    10/28/18 1041 02/19/19 1126 02/23/19 1221  PROBNP 364.0* 2,996* 2,825*     Other Studies Reviewed Today:  ECHO IMPRESSIONS 05/2019   1. Left ventricular ejection fraction, by visual estimation, is 60 to 65%. The left ventricle has normal function. Left ventricular septal wall thickness was normal. Normal left ventricular posterior wall thickness. There is no left ventricular  hypertrophy.  2. Elevated left ventricular end-diastolic pressure.  3. Left ventricular diastolic parameters are consistent with Grade II diastolic dysfunction (pseudonormalization).  4. Global right ventricle has normal systolic function.The right ventricular size is normal. No increase in right ventricular wall thickness.  5. Left atrial size was severely dilated.  6. Right atrial size was normal.  7. Mild mitral annular calcification.  8. Mild thickening of the anterior and posterior mitral valve leaflet(s).  9. The mitral valve is normal in structure. Mild mitral valve regurgitation. No evidence of mitral stenosis. 10. The tricuspid valve is normal in structure. 11. Aortic valve regurgitation is not visualized. No evidence of aortic valve sclerosis or stenosis. 12. The pulmonic valve was normal in structure. Pulmonic valve regurgitation is trivial. 13. Normal pulmonary artery systolic pressure. 14. The inferior vena cava is normal in size with greater than 50% respiratory variability, suggesting right atrial pressure of 3 mmHg.  CT CHESTIMPRESSION11/2020: Moderate right pleural effusion, increased from prior CT. Associated right lower lobe atelectasis.  No evidence of  pneumonia.  1.2 x 2.4 cm lesion along the inferior aspect of the right breast/chest wall. While additional findings noted above correlate with areas of fat necrosis, this lesion was not clearly identified when correlating with prior breast tomography report. Breast imaging consultation is suggested for further evaluation, including potential ultrasound with biopsy versus breast MR. These results will be called to the ordering clinician or representative by the Radiologist Assistant, and communication documented in the PACS or zVision Dashboard.  Aortic Atherosclerosis (ICD10-I70.0).   Electronically Signed By: Julian Hy M.D. On: 05/06/2019 18:11   CT ABDOMENIMPRESSION10/2020: Chest Impression:  1. Mild RIGHT basilar atelectasis and small RIGHT effusion. 2. Increased skin thickening in the lateral RIGHT breast. Recommend clinical correlation. 3. Stable seroma in the RIGHT breast.  Abdomen / Pelvis Impression:  1. No acute findings in the abdomen pelvis. 2. Nonobstructing calculi in the LEFT renal pelvis. 3. Degenerative endplate change in the lumbar spine similar to comparison CT 2019. 4. Coronary artery calcification and Aortic Atherosclerosis (ICD10-I70.0).   Electronically Signed By: Suzy Bouchard M.D. On: 03/17/2019 09:59   CXRIMPRESSION9/04/2019: Small right effusion and right lower lobe airspace disease unchanged. Negative for edema.   Electronically Signed By: Franchot Gallo M.D. On: 02/20/2019 15:39   ABD [place] 02/17/2019 Other findings: There is a somewhat lobulated fluid collection identified along the inferior aspect of the liver. This measures 2.6 x 3.4 x 4.3 cm. This was not seen on the prior CT examination and may be related to a focal fluid collection from prior cholecystectomy. Mild perihepatic ascites is noted.  Note is made of right-sided pleural effusion.  IMPRESSION: Status post  cholecystectomy. There is a lobulated cystic area as described above along the inferior right hepatic margin. This could be related to the prior surgery possibly representing a small seroma. CT may be helpful for further evaluation.  Small right pleural effusion.  Mild ascites.  Increased echogenicity within the liver likely related to fatty infiltration or underlying hepatocellular disease.  Electronically Signed By: Inez Catalina M.D. On: 02/17/2019 10:18  Breast Ultrasound 01/2019 IMPRESSION: 1. New irregular hypoechoic mass along the posterior margin of the scar right breast 8 o'clock position. Findings are nonspecific however concerning for the possibility of localized recurrence. 2. Within the outer aspect of the right axilla there are two adjacent irregular hypoechoic masses just deep to the skin which are nonspecific. These may represent focal fat necrosis or potentially recurrent or metastatic disease.  RECOMMENDATION: 1. Ultrasound-guided core needle biopsy right breast mass 8 o'clock position just deep to the scar. 2. Ultrasound-guided core needle biopsy of both irregular hypoechoic masses within the outer aspect of the right axilla.  MYOVIEW FINDINGS FROM 06/2013: Normal resting EKG. Slight ST depressions noted in aVL after administration of lexiscan. Mild shortness of breath and nausea resolved after  the test. EKG is nondiagnostic for ischemia. TID ratio 1.05. Lung-heart ratio 0.43. Normal ventricular chamber size.  IMPRESSION: No evidence for ischemia. Normal wall motion. LVEF 72%.  Electronically Signed By: Guy Sandifer   Cardiac Cath: 02/23/2013 Left mainstem: Normal  Left anterior descending (LAD): Severe proximal and mid calcification with long proximal 95% stenosis. The mid and distal vessel is small but free of high grade disease.  Left circumflex (LCx):AV groove has a mid 90% stenosis before a moderate sized MOM. OM1 and OM2 are  occluded at the ostium and fill via the SVG. OM3 occluded at the ostium and fills via SVG. The grafted OMs are small and diffusely disease.  Right coronary artery (RCA): Occluded in the mid vessel. The PDA is moderate sized and occluded at the ostium. There is a 70% stenosis in the PDA after the insertion of the vein graft.  Grafts:  LIMA to LAD: Patent  SVG to RCA: Occluded (from original CABG)  SVG to RCA: Patent (from redo CABG). This is mild diffuse plaque within the vein graft.  SVG to OM3: Patent. This is mild diffuse plaque within the vein graft.  SVG sequential to OM1/OM2: Patent. There is ostial 50% stenosis. There is a patent proximal SVG stent. The native marginals are very small.  Left ventriculography: LV not injected. AVR not crossed.  Final Conclusions: Severe native vessel CAD. Patent grafts as described with nonobstructive disease in the grafts. A stent placed into the sequential SVG is patent with some disease at the ostium that on several views in not occlusive. I did compare the 2009 cath with results today. There is high grade disease in the circumflex AV groove. However, this is not changed from 2009. However, this lesion does appear to lead into a non grafted OM. If she has further symptoms I would consider PCI of the native circumflex.     Assessment/Plan:  1.Reaccumulating pleural effusion - this has been very challenging - she is now s/p Pleurex catheter placement with talc. Unclear etiology and still worrisome to me given her history and prior CT scan. She is for repeat CXR next week.   2. Orthostatic hypotension - remains on Midodrine - she has had prior injury with fractured shoulder and significant head laceration. May be able to cut her lasix back more as her effusion resolves - hopefully.   3. Chronic diastolic HF - she manages her Lasix based on weight. She is restricting her salt. Her BP at home is typically very soft.   4. Abnormal abdominal US - has  had Ct of the abdomen as well - not discussed today. She does not see GI at this time.   5. Profound anemia - counts have improved - she is seeing Dr. Lindi Adie later this week.   6. CAD/aortic valve disease - remote redo CABG/prior AVR - managed medically - no exertional chest pain. CT with lots of calcium noted. Would favor continued medical management. Would be high risk for cardiac cath given her CKD. Her echo has been updated - she still has good pumping function - normal EF.   7. CKD - stage IV - she has had recent visit with Dr. Posey Pronto and she said this was stable. Lab last week noted.   8.History of orthostasis - reports negative adrenal work up - I see where she had an [place] from March 2019 -stillnot sure if we need to re-consider this going forward.Still may need to consider.She is using MidodrineTID. She does keep a  good check on her BP for [place].  9. PAF - in sinuson exam.Remains on low dose amiodarone.   10.Carotid disease-noted 1 to 39% bilateral stenosis from study 10/2017 -will need repeat study 10/2019- not discussed today  11. Prior breast cancer - recent abnormal mammogram - s/p multiple biopsies.Abnormality noted on the CT - she has had recent biopsies that were negative.This has been reviewed previously with Dr. Lindi Adie - she does note significant indentation/tenderness of the breast - she is going to discuss further with him on follow up later this week.Would defer further imaging to Dr. Lindi Adie.   12. COVID-19 Education: The signs and symptoms of COVID-19 were discussed with the patient and how to seek care for testing (follow up with PCP or arrange E-visit).  The importance of social distancing, staying at home, hand hygiene and wearing a mask when out in public were discussed today.   Current medicines are reviewed with the patient today.  The patient does not have concerns regarding medicines other than what has been noted above.  The following changes have  been made:  See above.  Labs/ tests ordered today include:   No orders of the defined types were placed in this encounter.    Disposition:   FU with me in 4 weeks.   Patient is agreeable to this plan and will call if any problems develop in the interim.   SignedTruitt Merle, NP  06/29/2019 3:45 PM  [facility] [address] [place], [place]  [address] Phone: [phone] Fax: [phone]

## 2019-06-26 NOTE — Brief Op Note (Signed)
06/26/2019  8:36 AM  PATIENT:  Hannah [name]  74 y.o. female  PRE-OPERATIVE DIAGNOSIS:  RECURRENT RIGHT PLEURAL EFFUSION  POST-OPERATIVE DIAGNOSIS:  RECURRENT RIGHT PLEURAL EFFUSION  PROCEDURE:  Procedure(s): INSERTION PLEURAL DRAINAGE CATHETER (Right) TALC PLEURADESIS (N/A)  SURGEON:  Surgeon(s) and Role:    * Yicel Shannon, Fernande Boyden, MD - Primary  ASSISTANTS: none   ANESTHESIA:   MAC  EBL:  none  BLOOD ADMINISTERED:none  DRAINS: none   LOCAL MEDICATIONS USED:  LIDOCAINE   SPECIMEN:  Source of Specimen:  right pleural fluid  DISPOSITION OF SPECIMEN:  PATHOLOGY  COUNTS:  YES  TOURNIQUET:  * No tourniquets in log *  DICTATION: .Note written in paper chart and Note written in EPIC  PLAN OF CARE: Discharge to home after PACU  PATIENT DISPOSITION:  PACU - hemodynamically stable.   Delay start of Pharmacological VTE agent (>24hrs) due to surgical blood loss or risk of bleeding: not applicable

## 2019-06-26 NOTE — Anesthesia Procedure Notes (Signed)
Procedure Name: Craig Performed by: Lowella Dell, CRNA Pre-anesthesia Checklist: Patient identified, Emergency Drugs available, Suction available, Patient being monitored and Timeout performed Patient Re-evaluated:Patient Re-evaluated prior to induction Oxygen Delivery Method: Simple face mask Induction Type: IV induction Placement Confirmation: positive ETCO2 Dental Injury: Teeth and Oropharynx as per pre-operative assessment

## 2019-06-26 NOTE — Transfer of Care (Signed)
Immediate Anesthesia Transfer of Care Note  Patient: Hannah [name]  Procedure(s) Performed: INSERTION PLEURAL DRAINAGE CATHETER (Right Chest) TALC PLEURADESIS (N/A )  Patient Location: PACU  Anesthesia Type:MAC  Level of Consciousness: awake, alert , oriented and patient cooperative  Airway & Oxygen Therapy: Patient Spontanous Breathing and Patient connected to face mask oxygen  Post-op Assessment: Report given to RN and Post -op Vital signs reviewed and stable  Post vital signs: Reviewed and stable  Last Vitals:  Vitals Value Taken Time  BP    Temp    Pulse 62 06/26/19 0845  Resp 12 06/26/19 0845  SpO2 100 % 06/26/19 0845  Vitals shown include unvalidated device data.  Last Pain:  Vitals:   06/26/19 0618  TempSrc: Oral  PainSc:          Complications: No apparent anesthesia complications

## 2019-06-26 NOTE — OR Nursing (Signed)
425 mL Pleural fluid removed from right side.

## 2019-06-26 NOTE — Interval H&P Note (Signed)
History and Physical Interval Note:  06/26/2019 7:21 AM  Hannah [name]  has presented today for surgery, with the diagnosis of RECURRENT RIGHT PLEURAL EFFUSION.  The various methods of treatment have been discussed with the patient and family. After consideration of risks, benefits and other options for treatment, the patient has consented to  Procedure(s): INSERTION PLEURAL DRAINAGE CATHETER (Right) TALC PLEURADESIS (N/A) as a surgical intervention.  The patient's history has been reviewed, patient examined, no change in status, stable for surgery.  I have reviewed the patient's chart and labs.  Questions were answered to the patient's satisfaction.     Gaye Pollack

## 2019-06-26 NOTE — Op Note (Signed)
CARDIOTHORACIC SURGERY OPERATIVE NOTE   06/26/2019 Hannah [name] [id number]  Surgeon: Gaye Pollack, MD   First Assistant: none   Preoperative Diagnosis:  Recurrent right pleural effusion  Postoperative Diagnosis: same   Procedure:   1.Insertion of right PleurX catheter   Anesthesia: MAC with local   Clinical History/Surgical Indication:   This 74 year old woman has a recurrent right pleural effusion with right lower lobe atelectasis causing exertional shortness of breath and orthopnea. She has had thoracentesis on 05/15/2019 and 06/08/2019 and now has recurrent effusion that is increasing and symptomatic. Cytology was negative for malignancy. She does have diastolic dysfunction on echocardiogram and this effusion may be due to congestive heart failure combined with her stage IV chronic kidney disease. I think the best option at this point is to place a right Pleurx catheter and perform talc pleurodesis. I would recommend sending the fluid again for cytology at that time given her history of breast cancer. I discussed the operative procedure with the patient including alternatives, benefits, and risks including but not limited to bleeding, infection, pneumothorax, and recurrent pleural effusion. She understands and agrees to proceed.  Preparation:  The patient was seen in the preoperative holding area and the correct patient, correct operation, correct operative sidewere confirmed with the patient after reviewing the medical record and CT scan. The right side of the chest was signed by me. The consent was signed by me. Preoperative antibiotics were given. The patient was taken back to the operating room and positioned supine on the operating room table. After intravenous sedation by anesthesia the chest was prepped with betadine soap and solution and draped in the usual sterile manner. A surgical time-out was taken and the correct patient,operative side, and operative procedure were  confirmed with the nursing and anesthesia staff.   Operative Procedure:   The chest wall entry site was located on the right lateral chest approximately in the 8th ICS. 1% lidocaine was infiltrated in the skin and subcutaneous tissue down to the pleural space. Serous fluid was encountered. A small incision was made and the right pleural space was entered with a needle catheter. The needle was removed and the guidewire inserted into the pleural space. Its position was checked with floroscopy. The skin exit site was chosen on the anterior chest wall just above the costal margin and lidocaine was infiltrated here and along a subcutaneous tunnel over to the chest wall entry site. A small incision was made at the skin exit site and the Pleurx catheter was tunneled from the exit site over to the chest wall entry site and positioned with the cuff just inside the exit site. An introducer and sheath were inserted into the pleural space over the guide wire and the introducer and wire removed. The catheter was inserted into the pleural space and the sheath removed. The catheter was connected to vacuum bottle suction and  450 cc of yellow pleural fluid was removed. A sample was sent for cytology. The catheter was fixed to the skin at the exit site with a nylon suture and the other incision was closed with a 3-0 vicryl subcuticular suture and Dermabond. The catheter was capped and a dressing applied over the exit site. The patient tolerated the procedure well with mild coughing. The sponge, needle and instrument counts were correct according to the nurses and the patient was transported to the PACU in stable and satisfactory condition.

## 2019-06-26 NOTE — Discharge Instructions (Signed)
Home health nursing will be arranged to start draining the Pleurex catheter and changing the dressing daily on Monday 06/29/2019.  You may shower with the dressing in place. If it gets water inside it needs to be changed.

## 2019-06-29 ENCOUNTER — Ambulatory Visit (INDEPENDENT_AMBULATORY_CARE_PROVIDER_SITE_OTHER): Payer: Medicare Other | Admitting: Nurse Practitioner

## 2019-06-29 ENCOUNTER — Encounter: Payer: Self-pay | Admitting: Nurse Practitioner

## 2019-06-29 ENCOUNTER — Ambulatory Visit (INDEPENDENT_AMBULATORY_CARE_PROVIDER_SITE_OTHER): Payer: Medicare Other | Admitting: Surgical

## 2019-06-29 ENCOUNTER — Other Ambulatory Visit: Payer: Self-pay

## 2019-06-29 ENCOUNTER — Other Ambulatory Visit: Payer: Self-pay | Admitting: Surgery

## 2019-06-29 VITALS — BP 112/42 | HR 66 | Ht 65.0 in | Wt 145.0 lb

## 2019-06-29 VITALS — BP 117/67 | HR 68 | Temp 97.7°F | Resp 16 | Ht 65.0 in | Wt 144.0 lb

## 2019-06-29 DIAGNOSIS — Z9689 Presence of other specified functional implants: Secondary | ICD-10-CM

## 2019-06-29 DIAGNOSIS — J90 Pleural effusion, not elsewhere classified: Secondary | ICD-10-CM | POA: Diagnosis not present

## 2019-06-29 DIAGNOSIS — I2510 Atherosclerotic heart disease of native coronary artery without angina pectoris: Secondary | ICD-10-CM | POA: Diagnosis not present

## 2019-06-29 DIAGNOSIS — I5032 Chronic diastolic (congestive) heart failure: Secondary | ICD-10-CM | POA: Diagnosis not present

## 2019-06-29 DIAGNOSIS — Z48813 Encounter for surgical aftercare following surgery on the respiratory system: Secondary | ICD-10-CM | POA: Diagnosis not present

## 2019-06-29 DIAGNOSIS — Z09 Encounter for follow-up examination after completed treatment for conditions other than malignant neoplasm: Secondary | ICD-10-CM | POA: Diagnosis not present

## 2019-06-29 DIAGNOSIS — N184 Chronic kidney disease, stage 4 (severe): Secondary | ICD-10-CM | POA: Diagnosis not present

## 2019-06-29 LAB — CYTOLOGY - NON PAP

## 2019-06-29 NOTE — Patient Instructions (Signed)
After Visit Summary:  We will be checking the following labs today - NONE   Medication Instructions:    Continue with your current medicines.    If you need a refill on your cardiac medications before your next appointment, please call your pharmacy.     Testing/Procedures To Be Arranged:  N/A  Follow-Up:   See me in one month    At [facility], you and your health needs are our priority.  As part of our continuing mission to provide you with exceptional heart care, we have created designated Provider Care Teams.  These Care Teams include your primary Cardiologist (physician) and Advanced Practice Providers (APPs -  Physician Assistants and Nurse Practitioners) who all work together to provide you with the care you need, when you need it.  Special Instructions:  . Stay safe, stay home, wash your hands for at least 20 seconds and wear a mask when out in public.  . It was good to talk with you today.    Call the [place] office at [phone] if you have any questions, problems or concerns.

## 2019-06-29 NOTE — Patient Instructions (Signed)
Cont same drainage schedule

## 2019-06-29 NOTE — Progress Notes (Signed)
[address]       [place] [address]             [phone]      Gustava M Knutzen [place] Medical Record #[id number] Date of Birth: 1945-10-22  Referring: [name], NP Primary Care: [name], DO Primary Cardiologist: Truitt Merle, NP   Chief Complaint:   POST OP FOLLOW UP [place] NOTE   06/26/2019 [name] Cordon [id number]  Surgeon: Gaye Pollack, MD   First Assistant: none   Preoperative Diagnosis:  Recurrent right pleural effusion  Postoperative Diagnosis: same   Procedure:   1.Insertion of right PleurX catheter   Anesthesia: MAC with local   History of Present Illness:    Patient is a 74 year old female status post the above described procedure who we were asked to see in the office on today's date due to home health nursing noting difficulty draining the Pleurx catheter.  There was apparently clot noted within the tubing and only scant drainage.  The patient is noted to be on Eliquis.  He denies any change in her normal symptoms in which shortness of breath is a component of her daily activities.      Past Medical History:  Diagnosis Date  . Abnormally small mouth   . Allergic rhinitis 10/14/2009   Qualifier: Diagnosis of  By: Lamonte Sakai MD, Rose Fillers   Overview:  Overview:  Qualifier: Diagnosis of  By: Lamonte Sakai MD, Rose Fillers  Last Assessment & Plan:  Please continue Xyzal and Nasacort as you have been using them  . Anemia   . Asthma 05/12/2009   10/12/2014 p extensive coaching HFA effectiveness =    75% s spacer    Overview:  Overview:  10/12/2014 p extensive coaching HFA effectiveness =    75% s spacer   Last Assessment & Plan:  Please continue Symbicort 2 puffs twice a day. Remember to rinse and gargle after taking this medication. Take albuterol 2 puffs up to every 4 hours if needed for shortness of breath.  Follow with Dr Lamonte Sakai in 6 month  . Bilateral carotid artery stenosis    a. mild 1-39% by  duplex 10/2017, due 2021.  . Breast cancer ([place]) 01/18/2016   right breast  . CAD (coronary artery disease) 01/24/2011   a. s/p CABG 1998 with redo 2009.  Marland Kitchen Chemotherapy induced nausea and vomiting 02/24/2016  . 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 Chronic diastolic CHF (congestive heart failure) ([place]) 09/24/2013  . CKD (chronic kidney disease), stage IV ([place]) 09/24/2013  . Closed fracture of head of left humerus 09/09/2017  . Complication of anesthesia   . Dysrhythmia   . Essential hypertension 05/12/2009   Qualifier: Diagnosis of  By: Lamonte Sakai MD, Rose Fillers   . Gallstones   . GERD (gastroesophageal reflux disease)   . Glaucoma   . H/O atrial tachycardia 05/12/2009   Qualifier: History of  By: Lamonte Sakai MD, Rose Fillers   Overview:  Overview:  Qualifier: History of  By: Lamonte Sakai MD, Rose Fillers  Last Assessment & Plan:  There was no evidence of this on her cardiac monitor. I would recommend watchful waiting.   Marland Kitchen Heart murmur   . History of kidney stones   . History of non-ST elevation myocardial infarction (NSTEMI)    Sept 2014--  thought to be type II HTN w/ LHC without infarct related artery and patent grafts  . History  of radiation therapy 05/24/16-07/26/16   right breast 50.4 Gy in 28 fractions, right breast boost 10 Gy in 5 fractions  . Hyperlipidemia   . Hypertension    Resolved.  . Iron deficiency anemia   . Mania ([place]) 03/05/2016  . Moderate persistent asthma    pulmologist-  Dr. Malvin Johns  . Osteomyelitis of toe of left foot ([place]) 04/06/2016  . Osteopenia of multiple sites 10/19/2015  . PAF (paroxysmal atrial fibrillation) ([place])   . Personal history of chemotherapy 2017  . Personal history of radiation therapy 2017  . PONV (postoperative nausea and vomiting)   . Port catheter in place 02/17/2016  . Psoriasis    right leg  . Renal calculus, right   . S/P AVR    prosthesis valve placement 2009 at same time re-do CABG  .  Seizure-like activity ([place]) 09/01/2017  . Sensorineural hearing loss (SNHL) of both ears 01/06/2016  . Stroke [facility])    residual rt hearing loss  . Syncope 09/06/2017  . Type 2 diabetes mellitus ([place])    monitored by dr Dwyane Dee     Social History   Tobacco Use  Smoking Status Never Smoker  Smokeless Tobacco Never Used    Social History   Substance and Sexual Activity  Alcohol Use No     Allergies  Allergen Reactions  . Amoxicillin Rash and Other (See Comments)    Tolerates Cephalosporins Has patient had a PCN reaction causing immediate rash, facial/tongue/throat swelling, SOB or lightheadedness with hypotension: Yes Has patient had a PCN reaction causing severe rash involving mucus membranes or skin necrosis: Yes Has patient had a PCN reaction that required hospitalization No Has patient had a PCN reaction occurring within the last 10 years: No If all of the above answers are "NO", then may proceed with Cephalosporin use.   . Tape Other (See Comments)    Must use paper tape  . Aldactone [Spironolactone] Other (See Comments)    CKD/hypokalemia  . Imdur [Isosorbide Dinitrate] Other (See Comments)    Headache/severe hypotension/Syncope  . Arimidex [Anastrozole] Nausea Only  . Latex Itching and Other (See Comments)    (Dentist office)  . Tetracycline Rash    Current Outpatient Medications  Medication Sig Dispense Refill  . acetaminophen (TYLENOL) 325 MG tablet Take 2 tablets (650 mg total) by mouth every 6 (six) hours as needed for mild pain (or Fever >/= 101).    Marland Kitchen albuterol (PROVENTIL HFA;VENTOLIN HFA) 108 (90 Base) MCG/ACT inhaler Inhale 2 puffs into the lungs every 6 (six) hours as needed for wheezing or shortness of breath.    Marland Kitchen amiodarone (PACERONE) 200 MG tablet Take 1 tablet (200 mg total) by mouth daily. 90 tablet 3  . apixaban (ELIQUIS) 5 MG TABS tablet Take 1 tablet (5 mg total) by mouth 2 (two) times daily. 180 tablet 2  . Blood Glucose Monitoring Suppl (ACCU-CHEK  GUIDE ME) w/Device KIT Use accu chek meter to check blood sugar three times daily. 1 kit 0  . budesonide-formoterol (SYMBICORT) 160-4.5 MCG/ACT inhaler INHALE TWO PUFFS INTO THE LUNGS TWICE DAILY (Patient taking differently: Inhale 2 puffs into the lungs 2 (two) times daily. ) 3 Inhaler 1  . exemestane (AROMASIN) 25 MG tablet TAKE ONE TABLET BY MOUTH DAILY AFTER BREAKFAST (Patient taking differently: Take 25 mg by mouth daily after breakfast. ) 90 tablet 3  . ezetimibe (ZETIA) 10 MG tablet TAKE ONE TABLET BY MOUTH EVERY DAY (Patient taking differently: Take 10 mg by mouth daily. ) 90  tablet 3  . famotidine (PEPCID) 20 MG tablet Take 1 tablet (20 mg total) by mouth 2 (two) times daily. One after supper 60 tablet 11  . furosemide (LASIX) 20 MG tablet Take 20 mg every other day. (Patient taking differently: Take 20 mg by mouth daily. ) 30 tablet 2  . glucose blood (ACCU-CHEK GUIDE) test strip Use to test blood sugar 3 times daily 150 each 3  . HUMALOG KWIKPEN 100 UNIT/ML KwikPen INJECT SUBCUTANEOUSLY 4 TO  6 UNITS 3 TIMES DAILY  BEFORE MEALS (Patient taking differently: Inject 3-6 Units into the skin 3 (three) times daily after meals. ) 30 mL 3  . insulin glargine (LANTUS) 100 UNIT/ML injection Inject 16 units under the skin once daily in the morning. (Patient taking differently: Inject 18 Units into the skin daily. ) 20 mL 3  . insulin lispro (HUMALOG) 100 UNIT/ML injection Inject 4-6 units under the skin three times daily before meals. 20 mL 3  . Insulin Pen Needle 32G X 4 MM MISC Use as instructed to inject insulin 4 times daily. 400 each 3  . loratadine (CLARITIN) 10 MG tablet Take 10 mg by mouth daily as needed for allergies.     . metoprolol tartrate (LOPRESSOR) 25 MG tablet Take 1 tablet (25 mg total) by mouth 2 (two) times daily. 180 tablet 3  . midodrine (PROAMATINE) 2.5 MG tablet TAKE ONE TABLET BY MOUTH THREE TIMES DAILY WITH MEALS (Patient taking differently: Take 2.5 mg by mouth 3 (three)  times daily with meals. ) 270 tablet 3  . nitroGLYCERIN (NITROSTAT) 0.4 MG SL tablet Place 1 tablet (0.4 mg total) under the tongue every 5 (five) minutes as needed for chest pain. 30 tablet 3  . oxyCODONE (ROXICODONE) 5 MG immediate release tablet Take 1 tablet (5 mg total) by mouth every 8 (eight) hours as needed. 20 tablet 0  . potassium chloride SA (K-DUR) 20 MEQ tablet Take 1 tablet (20 mEq total) by mouth daily. 90 tablet 3  . rosuvastatin (CRESTOR) 40 MG tablet Take 1 tablet (40 mg total) by mouth at bedtime. 90 tablet 3  . zolpidem (AMBIEN) 5 MG tablet TAKE ONE TABLET BY MOUTH AT BEDTIME AS NEEDED FOR SLEEP (Patient taking differently: Take 5 mg by mouth at bedtime as needed for sleep. ) 14 tablet 0  . epoetin alfa-epbx (RETACRIT) 44010 UNIT/ML injection Inject 40,000 Units into the skin every 7 (seven) days.      No current facility-administered medications for this visit.       Physical Exam: BP 117/67 (BP Location: Left Arm, Patient Position: Sitting, Cuff Size: Normal)   Pulse 68   Temp 97.7 F (36.5 C)   Resp 16   Ht _0  (1.651 m)   Wt 65.3 kg   SpO2 96% Comment: ON RA  BMI 23.96 kg/m   General appearance: alert, cooperative and no distress Heart: regular rate and rhythm Lungs: Mildly diminished in the right base, otherwise clear   Diagnostic Studies & Laboratory data:     Recent Radiology Findings:   No results found.    Recent Lab Findings: Lab Results  Component Value Date   WBC 6.7 06/25/2019   HGB 10.3 (L) 06/25/2019   HCT 33.9 (L) 06/25/2019   PLT 130 (L) 06/25/2019   GLUCOSE 209 (H) 06/24/2019   CHOL 106 12/02/2018   TRIG 97 12/02/2018   HDL 34 (L) 12/02/2018   LDLDIRECT 53.0 10/08/2016   LDLCALC 53 12/02/2018   ALT 39  06/24/2019   AST 61 (H) 06/24/2019   NA 140 06/24/2019   K 3.7 06/24/2019   CL 104 06/24/2019   CREATININE 1.95 (H) 06/24/2019   BUN 31 (H) 06/24/2019   CO2 28 06/24/2019   TSH 3.040 04/21/2019   INR 1.4 (H) 06/24/2019    HGBA1C 5.6 02/09/2019      Assessment / Plan: Difficulty draining this newly placed Pleurx catheter.  We attempted to drain and there was noted additional clot in the tubing system.  This was freed to some degree with the application of suction to the drainage device.  I flushed the tubing with 20 cc of normal saline and there is free flow of fluid in the catheter.  We attempted to drain the catheter again and only got about 50 cc of serous drainage.  There was a small amount of additional clot that was removed into the drainage container.  We will see the patient again on Wednesday with a chest x-ray as chest x-ray is not available today due to the [employer] holiday and [facility] being closed.  She is in no distress.  She will continue on her current daily drainage schedule for now.      Medication Changes: No orders of the defined types were placed in this encounter.     John Giovanni, PA-C 06/29/2019 2:11 PM

## 2019-07-01 ENCOUNTER — Other Ambulatory Visit: Payer: Self-pay

## 2019-07-01 ENCOUNTER — Ambulatory Visit
Admission: RE | Admit: 2019-07-01 | Discharge: 2019-07-01 | Disposition: A | Discharge status: Home / Self Care | Payer: Medicare Other | Source: Ambulatory Visit | Attending: Surgery | Admitting: Surgery

## 2019-07-01 ENCOUNTER — Ambulatory Visit (INDEPENDENT_AMBULATORY_CARE_PROVIDER_SITE_OTHER): Payer: Medicare Other | Admitting: Surgery

## 2019-07-01 ENCOUNTER — Encounter: Payer: Self-pay | Admitting: Surgery

## 2019-07-01 VITALS — BP 123/66 | HR 67 | Temp 97.9°F | Resp 16 | Ht 65.0 in | Wt 145.0 lb

## 2019-07-01 DIAGNOSIS — J90 Pleural effusion, not elsewhere classified: Secondary | ICD-10-CM | POA: Diagnosis not present

## 2019-07-01 DIAGNOSIS — Z9689 Presence of other specified functional implants: Secondary | ICD-10-CM

## 2019-07-01 DIAGNOSIS — Z09 Encounter for follow-up examination after completed treatment for conditions other than malignant neoplasm: Secondary | ICD-10-CM | POA: Diagnosis not present

## 2019-07-01 NOTE — Progress Notes (Signed)
Patient Care Team: [name], DO as PCP - General [name], NP as PCP - Cardiology (Nurse Practitioner) Evans Lance, MD as PCP - Electrophysiology (Cardiology) Alphonsa Overall, MD as Consulting Physician (General Surgery) Nicholas Lose, MD as Consulting Physician (Hematology and Oncology) Gery Pray, MD as Consulting Physician (Radiation Oncology) Evans Lance, MD as Consulting Physician (Cardiology) Collene Gobble, MD as Consulting Physician (Pulmonary Disease) Elayne Snare, MD as Consulting Physician (Endocrinology) Delice Bison, Charlestine Massed, NP as Nurse Practitioner (Hematology and Oncology) Ardelle Balls., MD (Neurology) Izora Gala, MD as Consulting Physician (Otolaryngology) Trula Slade, DPM as Consulting Physician (Podiatry)  DIAGNOSIS:    ICD-10-CM   1. Anemia of chronic disease  D63.8     SUMMARY OF ONCOLOGIC HISTORY: Oncology History  Breast cancer of upper-inner quadrant of right female breast ([place])  12/14/2015 Initial Diagnosis   Right breast biopsy 12:30 position: 2 masses, 1.6 cm mass: Invasive ductal carcinoma, grade 2, ER 0%, PR 0%, HER-2 negative, Ki-67 70%; satellite mass 8 mm: IDC grade 2 ER 5%, PR 5%, HER-2 negative, Ki-67 50%; T1cN0 stage IA clinical stage   01/18/2016 Surgery   Right lumpectomy: Multifocal IDC grade 3, 1.9 cm  ER 0%, PR 0%, HER-2 negative, Ki-67 70% and 0.8 cm satellite mass ER 5%, PR 2%, HER-2 negative, Ki-67 50%, high-grade DCIS, margins negative, 0/1 lymph nodes negative T1 cN0 stage IA   02/17/2016 - 04/06/2016 Chemotherapy   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 with above-mentioned history of right breast cancer treated with lumpectomy,adjuvant chemotherapy,radiation, and whois currently on antiestrogen therapywith exemestane.Shealsohas a history of anemiaandis currently receiving Retacritinjectionsand IV iron. She had a right PleurX catherter placed on 06/26/19 for recurrent pleural effusions and underwent a thoracentesis for which pathology showed no evidence of malignancy. Shepresents to the clinic today forfollow-up.  ALLERGIES:  is allergic to amoxicillin; tape; aldactone [spironolactone]; imdur [isosorbide dinitrate]; arimidex [anastrozole]; latex; and tetracycline.  MEDICATIONS:  Current Outpatient Medications  Medication Sig Dispense Refill  . acetaminophen (TYLENOL) 325 MG tablet Take 2 tablets (650 mg total) by mouth every 6 (six) hours as needed for mild pain (or Fever >/= 101).    Marland Kitchen albuterol (PROVENTIL HFA;VENTOLIN HFA) 108 (90 Base) MCG/ACT inhaler Inhale 2 puffs into the lungs every 6 (six) hours as needed for wheezing or shortness of breath.    Marland Kitchen amiodarone (PACERONE) 200 MG tablet Take 1 tablet (200 mg total) by mouth daily. 90 tablet 3  . apixaban (ELIQUIS) 5 MG TABS tablet Take 1 tablet (5 mg total) by mouth 2 (two) times daily. 180 tablet 2  . Blood Glucose Monitoring Suppl (ACCU-CHEK GUIDE ME) w/Device KIT Use accu chek meter to check blood sugar three times daily. 1 kit 0  . budesonide-formoterol (SYMBICORT) 160-4.5 MCG/ACT inhaler INHALE TWO PUFFS INTO THE LUNGS TWICE DAILY (Patient taking differently: Inhale 2 puffs into the lungs 2 (two) times daily. ) 3 Inhaler 1  .  epoetin alfa-epbx (RETACRIT) 38466 UNIT/ML injection Inject 40,000 Units into the skin every 7 (seven) days.     Marland Kitchen exemestane (AROMASIN) 25 MG tablet  TAKE ONE TABLET BY MOUTH DAILY AFTER BREAKFAST (Patient taking differently: Take 25 mg by mouth daily after breakfast. ) 90 tablet 3  . ezetimibe (ZETIA) 10 MG tablet TAKE ONE TABLET BY MOUTH EVERY DAY (Patient taking differently: Take 10 mg by mouth daily. ) 90 tablet 3  . famotidine (PEPCID) 20 MG tablet Take 1 tablet (20 mg total) by mouth 2 (two) times daily. One after supper 60 tablet 11  . furosemide (LASIX) 20 MG tablet Take 20 mg every other day. (Patient taking differently: Take 20 mg by mouth daily. ) 30 tablet 2  . glucose blood (ACCU-CHEK GUIDE) test strip Use to test blood sugar 3 times daily 150 each 3  . HUMALOG KWIKPEN 100 UNIT/ML KwikPen INJECT SUBCUTANEOUSLY 4 TO  6 UNITS 3 TIMES DAILY  BEFORE MEALS (Patient taking differently: Inject 3-6 Units into the skin 3 (three) times daily after meals. ) 30 mL 3  . insulin glargine (LANTUS) 100 UNIT/ML injection Inject 16 units under the skin once daily in the morning. (Patient taking differently: Inject 18 Units into the skin daily. ) 20 mL 3  . insulin lispro (HUMALOG) 100 UNIT/ML injection Inject 4-6 units under the skin three times daily before meals. 20 mL 3  . Insulin Pen Needle 32G X 4 MM MISC Use as instructed to inject insulin 4 times daily. 400 each 3  . loratadine (CLARITIN) 10 MG tablet Take 10 mg by mouth daily as needed for allergies.     . metoprolol tartrate (LOPRESSOR) 25 MG tablet Take 1 tablet (25 mg total) by mouth 2 (two) times daily. 180 tablet 3  . midodrine (PROAMATINE) 2.5 MG tablet TAKE ONE TABLET BY MOUTH THREE TIMES DAILY WITH MEALS (Patient taking differently: Take 2.5 mg by mouth 3 (three) times daily with meals. ) 270 tablet 3  . nitroGLYCERIN (NITROSTAT) 0.4 MG SL tablet Place 1 tablet (0.4 mg total) under the tongue every 5 (five) minutes as needed for chest pain. 30 tablet 3  . oxyCODONE (ROXICODONE) 5 MG immediate release tablet Take 1 tablet (5 mg total) by mouth every 8 (eight) hours as needed. 20 tablet 0    . potassium chloride SA (K-DUR) 20 MEQ tablet Take 1 tablet (20 mEq total) by mouth daily. 90 tablet 3  . rosuvastatin (CRESTOR) 40 MG tablet Take 1 tablet (40 mg total) by mouth at bedtime. 90 tablet 3  . zolpidem (AMBIEN) 5 MG tablet TAKE ONE TABLET BY MOUTH AT BEDTIME AS NEEDED FOR SLEEP (Patient taking differently: Take 5 mg by mouth at bedtime as needed for sleep. ) 14 tablet 0   No current facility-administered medications for this visit.    PHYSICAL EXAMINATION: ECOG PERFORMANCE STATUS: 2 - Symptomatic, <50% confined to bed  Vitals:   07/02/19 1345  BP: (!) 103/50  Pulse: 70  Resp: 17  Temp: 97.8 F (36.6 C)  SpO2: 100%   Filed Weights   07/02/19 1345  Weight: 144 lb 14.4 oz (65.7 kg)    LABORATORY DATA:  I have reviewed the data as listed CMP Latest Ref Rng & Units 06/24/2019 06/02/2019 04/21/2019  Glucose 70 - 99 mg/dL 209(H) 159(H) 144(H)  BUN 8 - 23 mg/dL 31(H) 22 32(H)  Creatinine 0.44 - 1.00 mg/dL 1.95(H) 1.44(H) 2.32(H)  Sodium 135 - 145 mmol/L 140 144 139  Potassium  3.5 - 5.1 mmol/L 3.7 3.3(L) 4.8  Chloride 98 - 111 mmol/L 104 104 104  CO2 22 - 32 mmol/L 28 28 22   Calcium 8.9 - 10.3 mg/dL 9.0 8.8 8.8  Total Protein 6.5 - 8.1 g/dL 6.5 - 6.1  Total Bilirubin 0.3 - 1.2 mg/dL 0.8 - 0.6  Alkaline Phos 38 - 126 U/L 74 - 60  AST 15 - 41 U/L 61(H) - 38  ALT 0 - 44 U/L 39 - 25    Lab Results  Component Value Date   WBC 5.4 07/02/2019   HGB 9.6 (L) 07/02/2019   HCT 31.6 (L) 07/02/2019   MCV 99.4 07/02/2019   PLT 142 (L) 07/02/2019   NEUTROABS 3.8 07/02/2019    ASSESSMENT & PLAN:  Anemia of chronic disease Patient has had longstanding anemia where the hemoglobin was between 10 to 12 g. Since April 2020 her hemoglobin has gotten worse and today it is at 6.6. During the same timeframe her creatinine got from 8.1-1.57 W62 folic acid and iron studies were normal. Differential diagnosis is anemia of chronic kidney disease stage III versus myelodysplastic  syndrome.  Current treatment:Retacritinjectionsweeklystarted7/31/2020 Bone marrow biopsy 04/08/2019: Normocellular bone marrow with trilineage hematopoiesis, absent iron stores, flow cytometry no abnormal B or T-cell clonal abnormalities noted.   IV iron therapy:Venofer weekly x4 04/24/19-05/15/19  CT chest: Moderate pleural effusion, right breast changes Ultrasound-guided thoracentesis 05/15/2019: 600 cc of yellow fluid removed ----------------------------------------------------------- Severe shortness of breath: Status post recent thoracentesis. Clinical examination reveals diminished breath sounds in the right lung base along with crackles. Pleurex catheter placed Recurrent pleural effusion could be related to heart failure. I recommended follow-up with cardiology.  Plan:  1.  Retacrit injections: Todays Hb is 9.6 Will proceed with retacrit Inj today 2.  Supportive care with blood transfusions as needed  Hardening of breast tissue outer aspect and inferior aspects of breast: previous biopsies were benign fat necrosis  Will keep her on weekly injections I will see her in 4 weeks    No orders of the defined types were placed in this encounter.  The patient has a good understanding of the overall plan. she agrees with it. she will call with any problems that may develop before the next visit here.  Total time spent: 30 mins including face to face time and time spent for planning, charting and coordination of care  Nicholas Lose, MD 07/02/2019  I, Cloyde Reams Dorshimer, am acting as scribe for Dr. Nicholas Lose.  I have reviewed the above documentation for accuracy and completeness, and I agree with the above.

## 2019-07-01 NOTE — Progress Notes (Signed)
HPI:  The patient returns today for follow-up status post insertion of right Pleurx catheter for drainage of a recurrent right pleural effusion on 06/26/2019.  She had 450 cc of serous fluid drained.  I instilled 4 g of talc slurry through the catheter and it was clamped.  She returned on Monday to have it drained but no fluid drained.  It was flushed sterilely and some fibrinous material was removed.  It has been drained daily since then by home health nursing and is drained 50, 50, and 100 cc per day since.  She said that her breathing has improved.  Current Outpatient Medications  Medication Sig Dispense Refill  . acetaminophen (TYLENOL) 325 MG tablet Take 2 tablets (650 mg total) by mouth every 6 (six) hours as needed for mild pain (or Fever >/= 101).    Marland Kitchen albuterol (PROVENTIL HFA;VENTOLIN HFA) 108 (90 Base) MCG/ACT inhaler Inhale 2 puffs into the lungs every 6 (six) hours as needed for wheezing or shortness of breath.    Marland Kitchen amiodarone (PACERONE) 200 MG tablet Take 1 tablet (200 mg total) by mouth daily. 90 tablet 3  . apixaban (ELIQUIS) 5 MG TABS tablet Take 1 tablet (5 mg total) by mouth 2 (two) times daily. 180 tablet 2  . Blood Glucose Monitoring Suppl (ACCU-CHEK GUIDE ME) w/Device KIT Use accu chek meter to check blood sugar three times daily. 1 kit 0  . budesonide-formoterol (SYMBICORT) 160-4.5 MCG/ACT inhaler INHALE TWO PUFFS INTO THE LUNGS TWICE DAILY (Patient taking differently: Inhale 2 puffs into the lungs 2 (two) times daily. ) 3 Inhaler 1  . epoetin alfa-epbx (RETACRIT) 16109 UNIT/ML injection Inject 40,000 Units into the skin every 7 (seven) days.     Marland Kitchen exemestane (AROMASIN) 25 MG tablet TAKE ONE TABLET BY MOUTH DAILY AFTER BREAKFAST (Patient taking differently: Take 25 mg by mouth daily after breakfast. ) 90 tablet 3  . ezetimibe (ZETIA) 10 MG tablet TAKE ONE TABLET BY MOUTH EVERY DAY (Patient taking differently: Take 10 mg by mouth daily. ) 90 tablet 3  . famotidine (PEPCID)  20 MG tablet Take 1 tablet (20 mg total) by mouth 2 (two) times daily. One after supper 60 tablet 11  . furosemide (LASIX) 20 MG tablet Take 20 mg every other day. (Patient taking differently: Take 20 mg by mouth daily. ) 30 tablet 2  . glucose blood (ACCU-CHEK GUIDE) test strip Use to test blood sugar 3 times daily 150 each 3  . HUMALOG KWIKPEN 100 UNIT/ML KwikPen INJECT SUBCUTANEOUSLY 4 TO  6 UNITS 3 TIMES DAILY  BEFORE MEALS (Patient taking differently: Inject 3-6 Units into the skin 3 (three) times daily after meals. ) 30 mL 3  . insulin glargine (LANTUS) 100 UNIT/ML injection Inject 16 units under the skin once daily in the morning. (Patient taking differently: Inject 18 Units into the skin daily. ) 20 mL 3  . insulin lispro (HUMALOG) 100 UNIT/ML injection Inject 4-6 units under the skin three times daily before meals. 20 mL 3  . Insulin Pen Needle 32G X 4 MM MISC Use as instructed to inject insulin 4 times daily. 400 each 3  . loratadine (CLARITIN) 10 MG tablet Take 10 mg by mouth daily as needed for allergies.     . metoprolol tartrate (LOPRESSOR) 25 MG tablet Take 1 tablet (25 mg total) by mouth 2 (two) times daily. 180 tablet 3  . midodrine (PROAMATINE) 2.5 MG tablet TAKE ONE TABLET BY MOUTH THREE TIMES DAILY WITH  MEALS (Patient taking differently: Take 2.5 mg by mouth 3 (three) times daily with meals. ) 270 tablet 3  . nitroGLYCERIN (NITROSTAT) 0.4 MG SL tablet Place 1 tablet (0.4 mg total) under the tongue every 5 (five) minutes as needed for chest pain. 30 tablet 3  . oxyCODONE (ROXICODONE) 5 MG immediate release tablet Take 1 tablet (5 mg total) by mouth every 8 (eight) hours as needed. 20 tablet 0  . potassium chloride SA (K-DUR) 20 MEQ tablet Take 1 tablet (20 mEq total) by mouth daily. 90 tablet 3  . rosuvastatin (CRESTOR) 40 MG tablet Take 1 tablet (40 mg total) by mouth at bedtime. 90 tablet 3  . zolpidem (AMBIEN) 5 MG tablet TAKE ONE TABLET BY MOUTH AT BEDTIME AS NEEDED FOR SLEEP  (Patient taking differently: Take 5 mg by mouth at bedtime as needed for sleep. ) 14 tablet 0   No current facility-administered medications for this visit.     Physical Exam: BP 123/66 (BP Location: Left Arm, Patient Position: Sitting, Cuff Size: Normal)   Pulse 67   Temp 97.9 F (36.6 C)   Resp 16   Ht 5' 5" (1.651 m)   Wt 145 lb (65.8 kg)   LMP  (LMP Unknown)   SpO2 96% Comment: RA  BMI 24.13 kg/m  She looks well. Lung exam reveals decreased breath sounds at the right base. The Pleurx catheter is intact under the dressing.  Diagnostic Tests:  CLINICAL DATA:  Follow-up pleural effusion.  EXAM: CHEST - 2 VIEW  COMPARISON:  06/26/2019  FINDINGS: The heart is normal in size and stable. 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 A loop recorder is noted on the left side. Mild tortuosity and calcification of the thoracic aorta again demonstrated.  Right-sided PleurX drainage catheter remains in good position. There is a persistent small right pleural effusion, slightly larger when compared to the prior chest film. The left lung remains clear. No left-sided pleural effusion.  IMPRESSION: PleurX drainage catheter remains in good position. Persistent small right pleural effusion, slightly larger when compared to the prior chest film.   Electronically Signed   By: Marijo Sanes M.D.   On: 07/01/2019 14:22   Impression:  She is doing well clinically.  There is a small right pleural effusion which is not changed significantly compared to her postop chest x-ray.  Some of this opacity at the right base may be due to the talc.  We will continue drainage of the Pleurx catheter every other day.  I will plan to see her back in 1 week for reevaluation.  Plan:  She will return to see me in 1 week with a chest x-ray.  I spent 10 minutes performing this established patient evaluation and > 50% of this time was spent face to face  counseling and coordinating the care of this patient's right Pleurx catheter.   Gaye Pollack, MD [facility] [phone]

## 2019-07-02 ENCOUNTER — Other Ambulatory Visit: Payer: Self-pay

## 2019-07-02 ENCOUNTER — Inpatient Hospital Stay: Payer: Medicare Other

## 2019-07-02 ENCOUNTER — Inpatient Hospital Stay (HOSPITAL_BASED_OUTPATIENT_CLINIC_OR_DEPARTMENT_OTHER): Payer: Medicare Other | Admitting: Hematology and Oncology

## 2019-07-02 ENCOUNTER — Other Ambulatory Visit (INDEPENDENT_AMBULATORY_CARE_PROVIDER_SITE_OTHER): Payer: Medicare Other

## 2019-07-02 VITALS — BP 104/61 | HR 63 | Temp 97.8°F | Resp 17

## 2019-07-02 DIAGNOSIS — Z794 Long term (current) use of insulin: Secondary | ICD-10-CM | POA: Diagnosis not present

## 2019-07-02 DIAGNOSIS — D638 Anemia in other chronic diseases classified elsewhere: Secondary | ICD-10-CM

## 2019-07-02 DIAGNOSIS — I129 Hypertensive chronic kidney disease with stage 1 through stage 4 chronic kidney disease, or unspecified chronic kidney disease: Secondary | ICD-10-CM | POA: Diagnosis not present

## 2019-07-02 DIAGNOSIS — E1165 Type 2 diabetes mellitus with hyperglycemia: Secondary | ICD-10-CM | POA: Diagnosis not present

## 2019-07-02 LAB — CBC WITH DIFFERENTIAL (CANCER CENTER ONLY)
Abs Immature Granulocytes: 0.01 10*3/uL (ref 0.00–0.07)
Basophils Absolute: 0.1 10*3/uL (ref 0.0–0.1)
Basophils Relative: 1 %
Eosinophils Absolute: 0.2 10*3/uL (ref 0.0–0.5)
Eosinophils Relative: 4 %
HCT: 31.6 % — ABNORMAL LOW (ref 36.0–46.0)
Hemoglobin: 9.6 g/dL — ABNORMAL LOW (ref 12.0–15.0)
Immature Granulocytes: 0 %
Lymphocytes Relative: 9 %
Lymphs Abs: 0.5 10*3/uL — ABNORMAL LOW (ref 0.7–4.0)
MCH: 30.2 pg (ref 26.0–34.0)
MCHC: 30.4 g/dL (ref 30.0–36.0)
MCV: 99.4 fL (ref 80.0–100.0)
Monocytes Absolute: 0.9 10*3/uL (ref 0.1–1.0)
Monocytes Relative: 16 %
Neutro Abs: 3.8 10*3/uL (ref 1.7–7.7)
Neutrophils Relative %: 70 %
Platelet Count: 142 10*3/uL — ABNORMAL LOW (ref 150–400)
RBC: 3.18 MIL/uL — ABNORMAL LOW (ref 3.87–5.11)
RDW: 17.2 % — ABNORMAL HIGH (ref 11.5–15.5)
WBC Count: 5.4 10*3/uL (ref 4.0–10.5)
nRBC: 0 % (ref 0.0–0.2)

## 2019-07-02 LAB — URINALYSIS, ROUTINE W REFLEX MICROSCOPIC
Bilirubin Urine: NEGATIVE
Ketones, ur: NEGATIVE
Leukocytes,Ua: NEGATIVE
Nitrite: NEGATIVE
RBC / HPF: NONE SEEN (ref 0–?)
Specific Gravity, Urine: 1.02 (ref 1.000–1.030)
Total Protein, Urine: 100 — AB
Urine Glucose: 500 — AB
Urobilinogen, UA: 0.2 (ref 0.0–1.0)
pH: 5.5 (ref 5.0–8.0)

## 2019-07-02 LAB — HEMOGLOBIN A1C: Hgb A1c MFr Bld: 7.1 % — ABNORMAL HIGH (ref 4.6–6.5)

## 2019-07-02 LAB — SAMPLE TO BLOOD BANK

## 2019-07-02 LAB — MICROALBUMIN / CREATININE URINE RATIO
Creatinine,U: 64.4 mg/dL
Microalb Creat Ratio: 22.1 mg/g (ref 0.0–30.0)
Microalb, Ur: 14.2 mg/dL — ABNORMAL HIGH (ref 0.0–1.9)

## 2019-07-02 LAB — GLUCOSE, RANDOM: Glucose, Bld: 232 mg/dL — ABNORMAL HIGH (ref 70–99)

## 2019-07-02 MED ORDER — EPOETIN ALFA-EPBX 40000 UNIT/ML IJ SOLN
40000.0000 [IU] | Freq: Once | INTRAMUSCULAR | Status: AC
  Administered 2019-07-02: 15:00:00 40000 [IU] via SUBCUTANEOUS

## 2019-07-02 MED ORDER — EPOETIN ALFA-EPBX 40000 UNIT/ML IJ SOLN
INTRAMUSCULAR | Status: AC
  Filled 2019-07-02: qty 1

## 2019-07-02 NOTE — Patient Instructions (Signed)

## 2019-07-02 NOTE — Assessment & Plan Note (Signed)
Patient has had longstanding anemia where the hemoglobin was between 10 to 12 g. Since April 2020 her hemoglobin has gotten worse and today it is at 6.6. During the same timeframe her creatinine got from 4.0-8.20 U49 folic acid and iron studies were normal. 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  Current treatment:Retacritinjectionsweeklystarted7/31/2020 Bone marrow biopsy 04/08/2019: Normocellular bone marrow with trilineage hematopoiesis, absent iron stores, flow cytometry no abnormal B or T-cell clonal abnormalities noted.   IV iron therapy:Venofer weekly x4 04/24/19-05/15/19  CT chest: Moderate pleural effusion, right breast changes Ultrasound-guided thoracentesis 05/15/2019: 600 cc of yellow fluid removed ----------------------------------------------------------- Severe shortness of breath: Status post recent thoracentesis. Clinical examination reveals diminished breath sounds in the right lung base along with crackles. Recurrent pleural effusion could be related to heart failure. I recommended follow-up with cardiology.  Plan:  1.  Retacrit injections: Initially it was weekly and now it has been switched to every 2 weeks. 2.  Supportive care with blood transfusions as needed  Return to clinic in 2 weeks for labs and injection appointment and follow-up with me.

## 2019-07-03 ENCOUNTER — Telehealth: Payer: Self-pay | Admitting: Hematology and Oncology

## 2019-07-03 LAB — FRUCTOSAMINE: Fructosamine: 276 umol/L (ref 0–285)

## 2019-07-03 NOTE — Telephone Encounter (Signed)
I talk with patient regarding schedule  

## 2019-07-06 ENCOUNTER — Encounter: Payer: Medicare Other | Admitting: Physician Assistant

## 2019-07-07 ENCOUNTER — Other Ambulatory Visit: Payer: Self-pay | Admitting: Surgery

## 2019-07-07 DIAGNOSIS — J90 Pleural effusion, not elsewhere classified: Secondary | ICD-10-CM

## 2019-07-07 LAB — FUNGAL ORGANISM REFLEX

## 2019-07-07 LAB — FUNGUS CULTURE WITH STAIN

## 2019-07-07 LAB — FUNGUS CULTURE RESULT

## 2019-07-08 ENCOUNTER — Ambulatory Visit
Admission: RE | Admit: 2019-07-08 | Discharge: 2019-07-08 | Disposition: A | Discharge status: Home / Self Care | Payer: Medicare Other | Source: Ambulatory Visit | Attending: Surgery | Admitting: Surgery

## 2019-07-08 ENCOUNTER — Ambulatory Visit (INDEPENDENT_AMBULATORY_CARE_PROVIDER_SITE_OTHER): Payer: Medicare Other | Admitting: Surgery

## 2019-07-08 ENCOUNTER — Encounter: Payer: Self-pay | Admitting: Surgery

## 2019-07-08 ENCOUNTER — Other Ambulatory Visit: Payer: Self-pay | Admitting: *Deleted

## 2019-07-08 ENCOUNTER — Other Ambulatory Visit: Payer: Self-pay

## 2019-07-08 VITALS — BP 102/62 | HR 70 | Temp 98.1°F | Resp 20 | Ht 65.0 in | Wt 142.5 lb

## 2019-07-08 DIAGNOSIS — J90 Pleural effusion, not elsewhere classified: Secondary | ICD-10-CM | POA: Diagnosis not present

## 2019-07-08 NOTE — Progress Notes (Signed)
HPI: The patient returns for follow-up of a right Pleurx catheter placed for recurrent right pleural effusion.  She had 4 g of talc instilled for pleurodesis at the time of insertion.  Since I saw her a week ago she is drained it twice.  She said the first time it drained about 10 cc and yesterday it only drained about a tablespoon.  She has had no discomfort.  She feels like her breathing is stable.  Current Outpatient Medications  Medication Sig Dispense Refill  . acetaminophen (TYLENOL) 325 MG tablet Take 2 tablets (650 mg total) by mouth every 6 (six) hours as needed for mild pain (or Fever >/= 101).    Marland Kitchen albuterol (PROVENTIL HFA;VENTOLIN HFA) 108 (90 Base) MCG/ACT inhaler Inhale 2 puffs into the lungs every 6 (six) hours as needed for wheezing or shortness of breath.    Marland Kitchen amiodarone (PACERONE) 200 MG tablet Take 1 tablet (200 mg total) by mouth daily. 90 tablet 3  . apixaban (ELIQUIS) 5 MG TABS tablet Take 1 tablet (5 mg total) by mouth 2 (two) times daily. 180 tablet 2  . Blood Glucose Monitoring Suppl (ACCU-CHEK GUIDE ME) w/Device KIT Use accu chek meter to check blood sugar three times daily. 1 kit 0  . budesonide-formoterol (SYMBICORT) 160-4.5 MCG/ACT inhaler INHALE TWO PUFFS INTO THE LUNGS TWICE DAILY (Patient taking differently: Inhale 2 puffs into the lungs 2 (two) times daily. ) 3 Inhaler 1  . epoetin alfa-epbx (RETACRIT) 65465 UNIT/ML injection Inject 40,000 Units into the skin every 7 (seven) days.     Marland Kitchen exemestane (AROMASIN) 25 MG tablet TAKE ONE TABLET BY MOUTH DAILY AFTER BREAKFAST (Patient taking differently: Take 25 mg by mouth daily after breakfast. ) 90 tablet 3  . ezetimibe (ZETIA) 10 MG tablet TAKE ONE TABLET BY MOUTH EVERY DAY (Patient taking differently: Take 10 mg by mouth daily. ) 90 tablet 3  . famotidine (PEPCID) 20 MG tablet Take 1 tablet (20 mg total) by mouth 2 (two) times daily. One after supper 60 tablet 11  . furosemide (LASIX) 20 MG tablet Take 20 mg every  other day. (Patient taking differently: Take 20 mg by mouth daily. ) 30 tablet 2  . glucose blood (ACCU-CHEK GUIDE) test strip Use to test blood sugar 3 times daily 150 each 3  . HUMALOG KWIKPEN 100 UNIT/ML KwikPen INJECT SUBCUTANEOUSLY 4 TO  6 UNITS 3 TIMES DAILY  BEFORE MEALS (Patient taking differently: Inject 3-6 Units into the skin 3 (three) times daily after meals. ) 30 mL 3  . insulin glargine (LANTUS) 100 UNIT/ML injection Inject 16 units under the skin once daily in the morning. (Patient taking differently: Inject 18 Units into the skin daily. ) 20 mL 3  . insulin lispro (HUMALOG) 100 UNIT/ML injection Inject 4-6 units under the skin three times daily before meals. 20 mL 3  . Insulin Pen Needle 32G X 4 MM MISC Use as instructed to inject insulin 4 times daily. 400 each 3  . loratadine (CLARITIN) 10 MG tablet Take 10 mg by mouth daily as needed for allergies.     . midodrine (PROAMATINE) 2.5 MG tablet TAKE ONE TABLET BY MOUTH THREE TIMES DAILY WITH MEALS (Patient taking differently: Take 2.5 mg by mouth 3 (three) times daily with meals. ) 270 tablet 3  . potassium chloride SA (K-DUR) 20 MEQ tablet Take 1 tablet (20 mEq total) by mouth daily. 90 tablet 3  . rosuvastatin (CRESTOR) 40 MG tablet Take 1  tablet (40 mg total) by mouth at bedtime. 90 tablet 3  . zolpidem (AMBIEN) 5 MG tablet TAKE ONE TABLET BY MOUTH AT BEDTIME AS NEEDED FOR SLEEP (Patient taking differently: Take 5 mg by mouth at bedtime as needed for sleep. ) 14 tablet 0  . metoprolol tartrate (LOPRESSOR) 25 MG tablet Take 1 tablet (25 mg total) by mouth 2 (two) times daily. 180 tablet 3  . nitroGLYCERIN (NITROSTAT) 0.4 MG SL tablet Place 1 tablet (0.4 mg total) under the tongue every 5 (five) minutes as needed for chest pain. 30 tablet 3  . oxyCODONE (ROXICODONE) 5 MG immediate release tablet Take 1 tablet (5 mg total) by mouth every 8 (eight) hours as needed. (Patient not taking: Reported on 07/08/2019) 20 tablet 0   No current  facility-administered medications for this visit.     Physical Exam: BP 102/62 (BP Location: Right Arm, Patient Position: Sitting, Cuff Size: Normal)   Pulse 70   Temp 98.1 F (36.7 C) (Skin)   Resp 20   Ht 5' 5"  (1.651 m)   Wt 142 lb 8 oz (64.6 kg)   LMP  (LMP Unknown)   SpO2 97% Comment: RA  BMI 23.71 kg/m  She looks well. Breath sounds are decreased at the right base.  Diagnostic Tests:  CLINICAL DATA:  Right pleural effusion  EXAM: CHEST - 2 VIEW  COMPARISON:  07/01/2019  FINDINGS: Pleural drainage catheter at the right lung base is unchanged. Similar small right pleural effusion and adjacent atelectasis. No pneumothorax. Stable cardiomediastinal contours with evidence of prior cardiac surgery. Loop recorder is again noted.  IMPRESSION: Similar small right pleural effusion and adjacent atelectasis.   Electronically Signed   By: [name] M.D.   On: 07/08/2019 09:43   Impression:  She has had no significant drainage from Pleurx catheter over the past week.  Her chest x-ray is stable with a small right basilar effusion which is unchanged from 1 week ago.  I think this probably related to the talc pleurodesis.  I think the Pleurx catheter can be removed.  Plan:  We will schedule Pleurx catheter removal and short stay.  I will see her back in the office 3 weeks after that with a chest x-ray.   Gaye Pollack, MD [facility] [phone]

## 2019-07-09 ENCOUNTER — Encounter: Payer: Self-pay | Admitting: Endocrinology

## 2019-07-09 ENCOUNTER — Ambulatory Visit (INDEPENDENT_AMBULATORY_CARE_PROVIDER_SITE_OTHER): Payer: Medicare Other | Admitting: Endocrinology

## 2019-07-09 ENCOUNTER — Inpatient Hospital Stay: Payer: Medicare Other

## 2019-07-09 ENCOUNTER — Other Ambulatory Visit: Payer: Self-pay

## 2019-07-09 VITALS — BP 104/65 | HR 72 | Temp 98.5°F | Resp 18

## 2019-07-09 VITALS — BP 102/40 | HR 72 | Ht 65.0 in | Wt 142.2 lb

## 2019-07-09 DIAGNOSIS — N1832 Chronic kidney disease, stage 3b: Secondary | ICD-10-CM

## 2019-07-09 DIAGNOSIS — E1165 Type 2 diabetes mellitus with hyperglycemia: Secondary | ICD-10-CM

## 2019-07-09 DIAGNOSIS — Z794 Long term (current) use of insulin: Secondary | ICD-10-CM

## 2019-07-09 DIAGNOSIS — E1142 Type 2 diabetes mellitus with diabetic polyneuropathy: Secondary | ICD-10-CM

## 2019-07-09 DIAGNOSIS — D638 Anemia in other chronic diseases classified elsewhere: Secondary | ICD-10-CM

## 2019-07-09 DIAGNOSIS — I129 Hypertensive chronic kidney disease with stage 1 through stage 4 chronic kidney disease, or unspecified chronic kidney disease: Secondary | ICD-10-CM | POA: Diagnosis not present

## 2019-07-09 LAB — CBC WITH DIFFERENTIAL (CANCER CENTER ONLY)
Abs Immature Granulocytes: 0.03 10*3/uL (ref 0.00–0.07)
Basophils Absolute: 0.1 10*3/uL (ref 0.0–0.1)
Basophils Relative: 1 %
Eosinophils Absolute: 0.2 10*3/uL (ref 0.0–0.5)
Eosinophils Relative: 3 %
HCT: 32.2 % — ABNORMAL LOW (ref 36.0–46.0)
Hemoglobin: 9.7 g/dL — ABNORMAL LOW (ref 12.0–15.0)
Immature Granulocytes: 0 %
Lymphocytes Relative: 6 %
Lymphs Abs: 0.6 10*3/uL — ABNORMAL LOW (ref 0.7–4.0)
MCH: 30.1 pg (ref 26.0–34.0)
MCHC: 30.1 g/dL (ref 30.0–36.0)
MCV: 100 fL (ref 80.0–100.0)
Monocytes Absolute: 0.9 10*3/uL (ref 0.1–1.0)
Monocytes Relative: 10 %
Neutro Abs: 7.2 10*3/uL (ref 1.7–7.7)
Neutrophils Relative %: 80 %
Platelet Count: 160 10*3/uL (ref 150–400)
RBC: 3.22 MIL/uL — ABNORMAL LOW (ref 3.87–5.11)
RDW: 17 % — ABNORMAL HIGH (ref 11.5–15.5)
WBC Count: 9 10*3/uL (ref 4.0–10.5)
nRBC: 0 % (ref 0.0–0.2)

## 2019-07-09 LAB — SAMPLE TO BLOOD BANK

## 2019-07-09 MED ORDER — EPOETIN ALFA-EPBX 40000 UNIT/ML IJ SOLN
INTRAMUSCULAR | Status: AC
  Filled 2019-07-09: qty 1

## 2019-07-09 MED ORDER — EPOETIN ALFA-EPBX 40000 UNIT/ML IJ SOLN
40000.0000 [IU] | Freq: Once | INTRAMUSCULAR | Status: AC
  Administered 2019-07-09: 14:00:00 40000 [IU] via SUBCUTANEOUS

## 2019-07-09 NOTE — Progress Notes (Signed)
Patient ID: Hannah [name], female   DOB: 15-Jun-1945, 74 y.o.   MRN: [id number]           Reason for Appointment: Follow-up for Type 2 Diabetes   History of Present Illness:          Date of diagnosis of type 2 diabetes mellitus: Late 1980s        Background history:  She was previously treated with metformin prior to starting insulin She thinks she has been on insulin for at least 10 years and mostly taking Lantus and other types of insulin with it Her metformin was stopped when she started insulin and also has had renal dysfunction Previously her A1c had been consistently over 8% and occasionally higher.  Recent history:   INSULIN regimen is described as:  Lantus 18 units in am, Humalog 4-6 units before meals  Non-insulin hypoglycemic drugs the patient is taking are: None, previously was on Trulicity 1.5 mg weekly  She had been on the V-go pump since 03/2015, this was stopped in 3/20  Current diabetes management, blood sugar patterns and problems identified:   Her A1c is usually lower than expected, now 7.1 compared to 5.6, likely from anemia.  Fructosamine is 276   Lantus was increased by 2 units when she called about blood sugars in the mornings getting higher  However her fasting readings have been quite variable but much better in the last week although still as high as 176  Most of her readings later in the day are over 200 except for couple of times before dinner about 140  As before she appears to be forgetting to take her insulin before eating  Also she is afraid of taking her Humalog insulin sometimes and will wait till afterwards when the sugar goes up and then take it  She does have some increase in her blood sugar after breakfast but has only a couple of readings; she is usually getting some cheese or eggs or other protein with her English muffin or grits  Has had only rare hypoglycemia with lowest blood sugar 58 around 3 AM; she likely took extra insulin to  cover her high blood sugar of 252 at bedtime the night before  She has lost about 24 pounds since her last visit in September but recently this appears to be stable  Her blood sugars were analyzed from her meter download and patterns discussed with patient   Blood sugar analysis as below:  Side effects from medications have been: None  Compliance with the medical regimen: Fair  Glucose monitoring:  done about 2-3 times a day         Glucometer:  Accu-Chek [facility] Blood Glucose readings by download of her meter for the last 2 weeks:   PRE-MEAL Fasting Lunch Dinner Bedtime Overall  Glucose range:  121-256  182-293  114-309  144-272   Mean/median:  170    238  197   Previous readings:  PRE-MEAL Fasting Lunch Dinner  PCS/at bedtime Overall  Glucose range:  81-163    71-288   Mean/median: 128    196 158   POST-MEAL PC Breakfast PC Lunch PC Dinner  Glucose range:   162-310   Mean/median:   170      Self-care: The diet that the patient has been following is: tries to limit fats and carbs.  She uses diet green tea or diet drinks and no sugar in drinks    Lunch 1-2 pm Dinner 6-7 PM  Typical meal intake: Breakfast is oatrmeal or cheese toast              Dietician visit, most recent: 5/16 And in 7/16 with oncology dietitian    Weight history:  Wt Readings from Last 3 Encounters:  07/09/19 142 lb 3.2 oz (64.5 kg)  07/08/19 142 lb 8 oz (64.6 kg)  07/02/19 144 lb 14.4 oz (65.7 kg)    Glycemic control:      Lab Results  Component Value Date   HGBA1C 7.1 (H) 07/02/2019   HGBA1C 5.6 02/09/2019   HGBA1C 6.8 (H) 11/18/2018   Lab Results  Component Value Date   MICROALBUR 14.2 (H) 07/02/2019   LDLCALC 53 12/02/2018   CREATININE 1.95 (H) 06/24/2019       Allergies as of 07/09/2019      Reactions   Amoxicillin Rash, Other (See Comments)   Tolerates Cephalosporins Has patient had a PCN reaction causing immediate rash, facial/tongue/throat swelling, SOB or  lightheadedness with hypotension: Yes Has patient had a PCN reaction causing severe rash involving mucus membranes or skin necrosis: Yes Has patient had a PCN reaction that required hospitalization No Has patient had a PCN reaction occurring within the last 10 years: No If all of the above answers are "NO", then may proceed with Cephalosporin use.   Tape Other (See Comments)   Must use paper tape   Aldactone [spironolactone] Other (See Comments)   CKD/hypokalemia   Imdur [isosorbide Dinitrate] Other (See Comments)   Headache/severe hypotension/Syncope   Arimidex [anastrozole] Nausea Only   Latex Itching, Other (See Comments)   (Dentist office)   Tetracycline Rash      Medication List       Accurate as of July 09, 2019 11:59 PM. If you have any questions, ask your nurse or doctor.        Accu-Chek Guide Me w/Device Kit Use accu chek meter to check blood sugar three times daily.   Accu-Chek Guide test strip Generic drug: glucose blood Use to test blood sugar 3 times daily   acetaminophen 325 MG tablet Commonly known as: TYLENOL Take 2 tablets (650 mg total) by mouth every 6 (six) hours as needed for mild pain (or Fever >/= 101).   albuterol 108 (90 Base) MCG/ACT inhaler Commonly known as: VENTOLIN HFA Inhale 2 puffs into the lungs every 6 (six) hours as needed for wheezing or shortness of breath.   amiodarone 200 MG tablet Commonly known as: PACERONE Take 1 tablet (200 mg total) by mouth daily.   apixaban 5 MG Tabs tablet Commonly known as: Eliquis Take 1 tablet (5 mg total) by mouth 2 (two) times daily.   budesonide-formoterol 160-4.5 MCG/ACT inhaler Commonly known as: Symbicort INHALE TWO PUFFS INTO THE LUNGS TWICE DAILY What changed:   how much to take  how to take this  when to take this  additional instructions   epoetin alfa-epbx 40000 UNIT/ML injection Commonly known as: RETACRIT Inject 40,000 Units into the skin every Thursday.   exemestane 25  MG tablet Commonly known as: AROMASIN TAKE ONE TABLET BY MOUTH DAILY AFTER BREAKFAST What changed: See the new instructions.   ezetimibe 10 MG tablet Commonly known as: ZETIA TAKE ONE TABLET BY MOUTH EVERY DAY   famotidine 20 MG tablet Commonly known as: Pepcid Take 1 tablet (20 mg total) by mouth 2 (two) times daily. One after supper What changed: additional instructions   furosemide 20 MG tablet Commonly known as: Lasix Take 20 mg every other  day. What changed:   how much to take  how to take this  when to take this  additional instructions   HumaLOG KwikPen 100 UNIT/ML KwikPen Generic drug: insulin lispro INJECT SUBCUTANEOUSLY 4 TO  6 UNITS 3 TIMES DAILY  BEFORE MEALS What changed: See the new instructions.   insulin glargine 100 UNIT/ML injection Commonly known as: LANTUS Inject 16 units under the skin once daily in the morning. What changed:   how much to take  how to take this  when to take this  additional instructions   Insulin Pen Needle 32G X 4 MM Misc Use as instructed to inject insulin 4 times daily.   loratadine 10 MG tablet Commonly known as: CLARITIN Take 10 mg by mouth daily as needed for allergies.   metoprolol tartrate 25 MG tablet Commonly known as: LOPRESSOR Take 1 tablet (25 mg total) by mouth 2 (two) times daily.   midodrine 2.5 MG tablet Commonly known as: PROAMATINE TAKE ONE TABLET BY MOUTH THREE TIMES DAILY WITH MEALS   nitroGLYCERIN 0.4 MG SL tablet Commonly known as: NITROSTAT Place 1 tablet (0.4 mg total) under the tongue every 5 (five) minutes as needed for chest pain.   oxyCODONE 5 MG immediate release tablet Commonly known as: Roxicodone Take 1 tablet (5 mg total) by mouth every 8 (eight) hours as needed.   potassium chloride SA 20 MEQ tablet Commonly known as: KLOR-CON Take 1 tablet (20 mEq total) by mouth daily. What changed:   when to take this  reasons to take this   rosuvastatin 40 MG tablet Commonly  known as: CRESTOR Take 1 tablet (40 mg total) by mouth at bedtime.   zolpidem 5 MG tablet Commonly known as: AMBIEN TAKE ONE TABLET BY MOUTH AT BEDTIME AS NEEDED FOR SLEEP What changed:   when to take this  additional instructions       Allergies:  Allergies  Allergen Reactions   Amoxicillin Rash and Other (See Comments)    Tolerates Cephalosporins Has patient had a PCN reaction causing immediate rash, facial/tongue/throat swelling, SOB or lightheadedness with hypotension: Yes Has patient had a PCN reaction causing severe rash involving mucus membranes or skin necrosis: Yes Has patient had a PCN reaction that required hospitalization No Has patient had a PCN reaction occurring within the last 10 years: No If all of the above answers are "NO", then may proceed with Cephalosporin use.    Tape Other (See Comments)    Must use paper tape   Aldactone [Spironolactone] Other (See Comments)    CKD/hypokalemia   Imdur [Isosorbide Dinitrate] Other (See Comments)    Headache/severe hypotension/Syncope   Arimidex [Anastrozole] Nausea Only   Latex Itching and Other (See Comments)    (Dentist office)   Tetracycline Rash    Past Medical History:  Diagnosis Date   Abnormally small mouth    Allergic rhinitis 10/14/2009   Qualifier: Diagnosis of  By: Lamonte Sakai MD, Rose Fillers   Overview:  Overview:  Qualifier: Diagnosis of  By: Lamonte Sakai MDRose Fillers  Last Assessment & Plan:  Please continue Xyzal and Nasacort as you have been using them   Anemia    Asthma 05/12/2009   10/12/2014 p extensive coaching HFA effectiveness =    75% s spacer    Overview:  Overview:  10/12/2014 p extensive coaching HFA effectiveness =    75% s spacer   Last Assessment & Plan:  Please continue Symbicort 2 puffs twice a day. Remember to rinse and  gargle after taking this medication. Take albuterol 2 puffs up to every 4 hours if needed for shortness of breath.  Follow with Dr Lamonte Sakai in 6 month   Bilateral carotid artery  stenosis    a. mild 1-39% by duplex 10/2017, due 2021.   Breast cancer ([place]) 01/18/2016   right breast   CAD (coronary artery disease) 01/24/2011   a. s/p CABG 1998 with redo 2009.   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   Chemotherapy-induced thrombocytopenia 04/06/2016   Chronic diastolic CHF (congestive heart failure) ([place]) 09/24/2013   CKD (chronic kidney disease), stage IV ([place]) 09/24/2013   Closed fracture of head of left humerus 7/0/6237   Complication of anesthesia    Dysrhythmia    Essential hypertension 05/12/2009   Qualifier: Diagnosis of  By: Lamonte Sakai MD, Rose Fillers    Gallstones    GERD (gastroesophageal reflux disease)    Glaucoma    H/O atrial tachycardia 05/12/2009   Qualifier: History of  By: Lamonte Sakai MD, Rose Fillers   Overview:  Overview:  Qualifier: History of  By: Lamonte Sakai MD, Rose Fillers  Last Assessment & Plan:  There was no evidence of this on her cardiac monitor. I would recommend watchful waiting.    Heart murmur    History of kidney stones    History of non-ST elevation myocardial infarction (NSTEMI)    Sept 2014--  thought to be type II HTN w/ LHC without infarct related artery and patent grafts   History of radiation therapy 05/24/16-07/26/16   right breast 50.4 Gy in 28 fractions, right breast boost 10 Gy in 5 fractions   Hyperlipidemia    Hypertension    Resolved.   Iron deficiency anemia    Mania ([place]) 03/05/2016   Moderate persistent asthma    pulmologist-  Dr. Malvin Johns   Osteomyelitis of toe of left foot ([place]) 04/06/2016   Osteopenia of multiple sites 10/19/2015   PAF (paroxysmal atrial fibrillation) ([place])    Personal history of chemotherapy 2017   Personal history of radiation therapy 2017   PONV (postoperative nausea and vomiting)    Port catheter in place 02/17/2016   Psoriasis    right leg   Renal calculus, right    S/P AVR    prosthesis valve placement 2009  at same time re-do CABG   Seizure-like activity ([place]) 09/01/2017   Sensorineural hearing loss (SNHL) of both ears 01/06/2016   Stroke ([place])    residual rt hearing loss   Syncope 09/06/2017   Type 2 diabetes mellitus ([place])    monitored by dr Dwyane Dee    Past Surgical History:  Procedure Laterality Date   [facility]ORTIC VAL[facility]ACEMENT  [facility]done same time is [facility]   BREAST LUMPECTOMY Right 01/18/2016   BREAST LUMPECTOMY WITH RADIOACTIVE SEED AND SENTINEL LYMPH NODE BIOPSY Right 01/18/2016   Procedure: RIGHT BREAST LUMPECTOMY WITH RADIOACTIVE SEED AND SENTINEL LYMPH NODE BIOPSY;  Surgeon: DAlphonsa Overall MD;  [facility]  Service: General;  Laterality: Right;   CARDIAC CATHETERIZATION  03/23/2008   Pre-redo CABG: L main OK, LAD (T), CFX (T), OM1 99%, RCA (T), LIMA-LAD OK, SVG-OM(?3) OK w/ little florw to OM2, SVG-RCA OK. EF NL   CARPAL TUNNEL RELEASE     CHEST TUBE INSERTION Right 06/26/2019   Procedure: INSERTION PLEURAL DRAINAGE CATHETER;  Surgeon: BGaye Pollack MD;  [facility]  Service: Thoracic;  Laterality: Right;  CHOLECYSTECTOMY N/A 07/07/2018   Procedure: LAPAROSCOPIC CHOLECYSTECTOMY WITH INTRAOPERATIVE CHOLANGIOGRAM ERAS PATHWAY;  Surgeon: Alphonsa Overall, MD;  Location: [facility];  Service: General;  Laterality: N/A;   COLONOSCOPY     around 2015. Possibly with [place] &  re-do 2009   Had LIMA to DX/LAD, SVG to 2 marginal branches and SVG to [facility] originally; SVG to 3rd OM and PD at time of redo   CYSTOSCOPY W/ URETERAL STENT PLACEMENT Right 12/20/2014   Procedure: CYSTOSCOPY WITH RETROGRADE PYELOGRAM/URETERAL STENT PLACEMENT;  Surgeon: Cleon Gustin, MD;  Location: [facility];  Service: Urology;  Laterality: Right;   ESOPHAGOGASTRODUODENOSCOPY     many years ago per patient    ESOPHAGOGASTRODUODENOSCOPY ENDOSCOPY  06/17/2018   EYE SURGERY Bilateral    cataracts    HOLMIUM LASER APPLICATION Right 3/84/6659   Procedure:  HOLMIUM LASER LITHOTRIPSY;  Surgeon: Cleon Gustin, MD;  Location: [facility];  Service: Urology;  Laterality: Right;   IR THORACENTESIS ASP PLEURAL SPACE W/IMG GUIDE  10/03/2018   IR THORACENTESIS ASP PLEURAL SPACE W/IMG GUIDE  06/08/2019   LEFT HEART CATHETERIZATION WITH CORONARY/GRAFT ANGIOGRAM N/A 02/23/2013   Procedure: LEFT HEART CATHETERIZATION WITH Beatrix Fetters;  Surgeon: Blane Ohara, MD;  Location[facility] LAB;  Service: Cardiovascular;  Laterality: N/A;   LOOP RECORDER INSERTION N/A 08/30/2017   Procedure: LOOP RECORDER INSERTION;  Surgeon: Evans Lance, MD;  Location: [facility] CV LAB;  Service: Cardiovascular;  Laterality: N/A;   PORTACATH PLACEMENT Left 01/18/2016   Procedure: INSERTION PORT-A-CATH;  Surgeon: Alphonsa Overall, MD;  Location: [facility];  Service: General;  Laterality: Left;   portacath removal     TALC PLEURODESIS N/A 06/26/2019   Procedure: Pietro Cassis;  Surgeon: Gaye Pollack, MD;  Location: [facility];  Service: Thoracic;  Laterality: N/A;   TONSILLECTOMY     TRANSTHORACIC ECHOCARDIOGRAM  02-24-2013      mild LVH,  ef 50-55%/  AV bioprosthesis was present with very mild stenosis and no regurg., mean grandient 71mHg, peak grandient 233mg /  mild MR/  mild LAE and RAE/  moderate TR   TUBAL LIGATION      Family History  Problem Relation Age of Onset   Heart disease Father    Heart failure Father    Diabetes Maternal Grandmother    Heart disease Maternal Grandmother    Diabetes Son    Healthy Brother        #1   Heart attack Brother        #2   Heart disease Brother        #2   Colon cancer Neg Hx    Esophageal cancer Neg Hx     Social History:  reports that she has never smoked. She has never used smokeless tobacco. She reports that she does not drink alcohol or use drugs.    Review of Systems    Lipid history: On 40 mg Crestor along with  Zetia      Lab Results  Component Value Date   CHOL 106 12/02/2018   HDL 34 (L) 12/02/2018   LDLCALC 53 12/02/2018   LDLDIRECT 53.0 10/08/2016   TRIG 97 12/02/2018   CHOLHDL 3.1 12/02/2018           Eyes:  No history of retinopathy  She is followed by nephrologist for renal dysfunction which shows variable results    Lab Results  Component  Value Date   CREATININE 1.95 (H) 06/24/2019   CREATININE 1.44 (H) 06/02/2019   CREATININE 2.32 (H) 04/21/2019    Diabetic foot exam in 3/20: She has significant loss of monofilament sensation in her distal feet She thinks her neuropathy started after her chemotherapy Also has some difficulty with balance  BLOOD pressure: She has low normal blood pressure readings, currently not on Florinef  BP Readings from Last 3 Encounters:  07/09/19 (!) 102/40  07/09/19 104/65  07/08/19 102/62   She is on amiodarone 200 mg, no history of thyroid dysfunction  Lab Results  Component Value Date   TSH 3.040 04/21/2019      Physical Examination:  BP (!) 102/40 (BP Location: Left Arm, Patient Position: Sitting, Cuff Size: Normal)    Pulse 72    Ht 5' 5"  (1.651 m)    Wt 142 lb 3.2 oz (64.5 kg)    LMP  (LMP Unknown)    SpO2 98%    BMI 23.66 kg/m     ASSESSMENT:  Diabetes type 2, on insulin pump    See history of present illness for detailed discussion of  current management, blood sugar patterns and problems identified  Her A1c is relatively higher at 7.1, previously as low as 5.6  A1c is not accurate because of her severe anemia and this was explained to her  She has lost weight since last year but she still has significant postprandial hyperglycemia  This is likely to be from not taking her mealtime insulin as directed and only when her blood sugar goes up Usually blood sugars are over 200 postprandially She is somewhat concerned about not eating consistently and not wanting to take her Humalog when she is eating for fear of  hypoglycemia However discussed that if she takes her Humalog late it is ineffective and she will have tendency to low sugars 3 to 4 hours later Has had at least one episode of low sugar late at night Explained to her that persistently high sugars will also make her have tendency to lose weight and cause more fatigue   Abnormal renal function: This is quite variable and depends on her hydration status and use of Lasix Blood pressure is similar to previous reading  Chronic amiodarone therapy: We will check TSH again on the next visit  PLAN:    Check blood sugars either before or 2 hours after meals especially at lunch and dinner  No change in Lantus unless fasting blood sugars are consistently high Discussed importance of taking her Humalog right at the time of her meal and prevent persistent hyperglycemia Difficult to adjust her dose not knowing if her blood sugars will be better with timely injections If she is not sure how much she will eat she will take her injection right after her meal finishes otherwise take it right before eating For larger meals and more carbohydrates she will likely need 8 units at mealtimes Also if she has persistently high readings over 200 about 2 hours after eating she will increase her dose to 8 or more units Avoid eating more carbohydrate without balancing it with protein  Recommended more regular follow-up to help consistent control and she will come back in 2 months   Patient Instructions  Take Humalog as soon as finishing meal  If sugar still goes >200 with Humalog on time go to 8 units         Elayne Snare 07/10/2019, 11:56 AM   Note: This office note was prepared  with Estate agent. Any transcriptional errors that result from this process are unintentional.

## 2019-07-09 NOTE — Patient Instructions (Signed)
Take Humalog as soon as finishing meal  If sugar still goes >200 with Humalog on time go to 8 units

## 2019-07-09 NOTE — Patient Instructions (Signed)

## 2019-07-13 DIAGNOSIS — Z8616 Personal history of COVID-19: Secondary | ICD-10-CM

## 2019-07-13 HISTORY — DX: Personal history of COVID-19: Z86.16

## 2019-07-14 ENCOUNTER — Ambulatory Visit (HOSPITAL_COMMUNITY)
Admission: RE | Admit: 2019-07-14 | Discharge: 2019-07-14 | Disposition: A | Discharge status: Home / Self Care | Payer: Medicare Other | Attending: Surgery | Admitting: Surgery

## 2019-07-14 ENCOUNTER — Encounter (HOSPITAL_COMMUNITY)
Admission: RE | Disposition: A | Discharge status: Home / Self Care | Payer: Self-pay | Source: Home / Self Care | Attending: Surgery

## 2019-07-14 ENCOUNTER — Encounter (HOSPITAL_COMMUNITY): Payer: Self-pay | Admitting: Surgery

## 2019-07-14 DIAGNOSIS — J90 Pleural effusion, not elsewhere classified: Secondary | ICD-10-CM | POA: Diagnosis not present

## 2019-07-14 DIAGNOSIS — Z7951 Long term (current) use of inhaled steroids: Secondary | ICD-10-CM | POA: Insufficient documentation

## 2019-07-14 DIAGNOSIS — Z79899 Other long term (current) drug therapy: Secondary | ICD-10-CM | POA: Diagnosis not present

## 2019-07-14 DIAGNOSIS — Z791 Long term (current) use of non-steroidal anti-inflammatories (NSAID): Secondary | ICD-10-CM | POA: Diagnosis not present

## 2019-07-14 DIAGNOSIS — Z7901 Long term (current) use of anticoagulants: Secondary | ICD-10-CM | POA: Diagnosis not present

## 2019-07-14 DIAGNOSIS — Z4682 Encounter for fitting and adjustment of non-vascular catheter: Secondary | ICD-10-CM | POA: Insufficient documentation

## 2019-07-14 HISTORY — PX: REMOVAL OF PLEURAL DRAINAGE CATHETER: SHX5080

## 2019-07-14 SURGERY — REMOVAL, CLOSED DRAINAGE CATHETER SYSTEM, PLEURAL
Anesthesia: Monitor Anesthesia Care | Laterality: Right

## 2019-07-14 NOTE — Procedures (Signed)
Dx: S/P placement of right PleurX catheter and talc pleurodesis for recurrent pleural effusion.   The PleurX catheter was drained by the patient 4 days ago with less than 71ml evacuated and drainage was attempted again yesterday by her home health RN with less than 74ml removed.   Anesthesia:  2% lidocaine plain local  Description of procedure:  After obtaining informed consent and performing a timeout, the skin around the right chest PleurX exit site was prepped with betadine and infiltrated with lidocaine. The suture was removed and using gentle retraction the catheter released from the connective tissue and was removed intact. No sharp dissection was necessary. After the site was determined to be hemostatic, a sterile 2x2 gauze dressing was placed and covered with a Tegaderm.   Hannah [name] was instructed to leave the dressing in place for 48 hours then remove and leave the incision open to air.   She has scheduled follow up with Dr. Cyndia Bent in 2 weeks.  Macarthur Critchley, PA-C

## 2019-07-14 NOTE — Progress Notes (Signed)
Patient discharged home with husband, with vital signs within normal limits.  Instructions given by Parks Neptune, PA.  Patient acknowledged understanding.

## 2019-07-14 NOTE — H&P (Signed)
HPI:  The patient returns for follow-up of a right Pleurx catheter placed for recurrent right pleural effusion. She had 4 g of talc instilled for pleurodesis at the time of insertion. Since I saw her a week ago she is drained it twice. She said the first time it drained about 10 cc and yesterday it only drained about a tablespoon. She has had no discomfort. She feels like her breathing is stable.        Current Outpatient Medications  Medication Sig Dispense Refill  . acetaminophen (TYLENOL) 325 MG tablet Take 2 tablets (650 mg total) by mouth every 6 (six) hours as needed for mild pain (or Fever >/= 101).    Marland Kitchen albuterol (PROVENTIL HFA;VENTOLIN HFA) 108 (90 Base) MCG/ACT inhaler Inhale 2 puffs into the lungs every 6 (six) hours as needed for wheezing or shortness of breath.    Marland Kitchen amiodarone (PACERONE) 200 MG tablet Take 1 tablet (200 mg total) by mouth daily. 90 tablet 3  . apixaban (ELIQUIS) 5 MG TABS tablet Take 1 tablet (5 mg total) by mouth 2 (two) times daily. 180 tablet 2  . Blood Glucose Monitoring Suppl (ACCU-CHEK GUIDE ME) w/Device KIT Use accu chek meter to check blood sugar three times daily. 1 kit 0  . budesonide-formoterol (SYMBICORT) 160-4.5 MCG/ACT inhaler INHALE TWO PUFFS INTO THE LUNGS TWICE DAILY (Patient taking differently: Inhale 2 puffs into the lungs 2 (two) times daily. ) 3 Inhaler 1  . epoetin alfa-epbx (RETACRIT) 80998 UNIT/ML injection Inject 40,000 Units into the skin every 7 (seven) days.     Marland Kitchen exemestane (AROMASIN) 25 MG tablet TAKE ONE TABLET BY MOUTH DAILY AFTER BREAKFAST (Patient taking differently: Take 25 mg by mouth daily after breakfast. ) 90 tablet 3  . ezetimibe (ZETIA) 10 MG tablet TAKE ONE TABLET BY MOUTH EVERY DAY (Patient taking differently: Take 10 mg by mouth daily. ) 90 tablet 3  . famotidine (PEPCID) 20 MG tablet Take 1 tablet (20 mg total) by mouth 2 (two) times daily. One after supper 60 tablet 11  . furosemide (LASIX) 20 MG tablet Take 20 mg every  other day. (Patient taking differently: Take 20 mg by mouth daily. ) 30 tablet 2  . glucose blood (ACCU-CHEK GUIDE) test strip Use to test blood sugar 3 times daily 150 each 3  . HUMALOG KWIKPEN 100 UNIT/ML KwikPen INJECT SUBCUTANEOUSLY 4 TO 6 UNITS 3 TIMES DAILY BEFORE MEALS (Patient taking differently: Inject 3-6 Units into the skin 3 (three) times daily after meals. ) 30 mL 3  . insulin glargine (LANTUS) 100 UNIT/ML injection Inject 16 units under the skin once daily in the morning. (Patient taking differently: Inject 18 Units into the skin daily. ) 20 mL 3  . insulin lispro (HUMALOG) 100 UNIT/ML injection Inject 4-6 units under the skin three times daily before meals. 20 mL 3  . Insulin Pen Needle 32G X 4 MM MISC Use as instructed to inject insulin 4 times daily. 400 each 3  . loratadine (CLARITIN) 10 MG tablet Take 10 mg by mouth daily as needed for allergies.     . midodrine (PROAMATINE) 2.5 MG tablet TAKE ONE TABLET BY MOUTH THREE TIMES DAILY WITH MEALS (Patient taking differently: Take 2.5 mg by mouth 3 (three) times daily with meals. ) 270 tablet 3  . potassium chloride SA (K-DUR) 20 MEQ tablet Take 1 tablet (20 mEq total) by mouth daily. 90 tablet 3  . rosuvastatin (CRESTOR) 40 MG tablet Take 1 tablet (40  mg total) by mouth at bedtime. 90 tablet 3  . zolpidem (AMBIEN) 5 MG tablet TAKE ONE TABLET BY MOUTH AT BEDTIME AS NEEDED FOR SLEEP (Patient taking differently: Take 5 mg by mouth at bedtime as needed for sleep. ) 14 tablet 0  . metoprolol tartrate (LOPRESSOR) 25 MG tablet Take 1 tablet (25 mg total) by mouth 2 (two) times daily. 180 tablet 3  . nitroGLYCERIN (NITROSTAT) 0.4 MG SL tablet Place 1 tablet (0.4 mg total) under the tongue every 5 (five) minutes as needed for chest pain. 30 tablet 3  . oxyCODONE (ROXICODONE) 5 MG immediate release tablet Take 1 tablet (5 mg total) by mouth every 8 (eight) hours as needed. (Patient not taking: Reported on 07/08/2019) 20 tablet 0   No current  facility-administered medications for this visit.   Physical Exam:  BP 102/62 (BP Location: Right Arm, Patient Position: Sitting, Cuff Size: Normal)  Pulse 70  Temp 98.1 F (36.7 C) (Skin)  Resp 20  Ht _0  (1.651 m)  Wt 142 lb 8 oz (64.6 kg)  LMP (LMP Unknown)  SpO2 97% Comment: RA  BMI 23.71 kg/m  She looks well.  Breath sounds are decreased at the right base.  Diagnostic Tests:  CLINICAL DATA: Right pleural effusion  EXAM:  CHEST - 2 VIEW  COMPARISON: 07/01/2019  FINDINGS:  Pleural drainage catheter at the right lung base is unchanged.  Similar small right pleural effusion and adjacent atelectasis. No  pneumothorax. Stable cardiomediastinal contours with evidence of  prior cardiac surgery. Loop recorder is again noted.  IMPRESSION:  Similar small right pleural effusion and adjacent atelectasis.  Electronically Signed  By: [name] M.D.  On: 07/08/2019 09:43   Impression:   She has had no significant drainage from Pleurx catheter over the past week. Her chest x-ray is stable with a small right basilar effusion which is unchanged from 1 week ago. I think this probably related to the talc pleurodesis. I think the Pleurx catheter can be removed.   Plan:   Removal of PleurX catheter  Gaye Pollack, MD  [facility]  [phone]

## 2019-07-15 ENCOUNTER — Ambulatory Visit: Payer: Medicare Other | Admitting: Surgery

## 2019-07-16 ENCOUNTER — Encounter: Payer: Self-pay | Admitting: *Deleted

## 2019-07-16 ENCOUNTER — Inpatient Hospital Stay: Payer: Medicare Other

## 2019-07-16 ENCOUNTER — Inpatient Hospital Stay: Payer: Medicare Other | Attending: Hematology and Oncology

## 2019-07-16 ENCOUNTER — Other Ambulatory Visit: Payer: Self-pay

## 2019-07-16 VITALS — BP 138/72 | HR 72 | Temp 98.2°F | Resp 18

## 2019-07-16 DIAGNOSIS — Z79899 Other long term (current) drug therapy: Secondary | ICD-10-CM | POA: Insufficient documentation

## 2019-07-16 DIAGNOSIS — Z923 Personal history of irradiation: Secondary | ICD-10-CM | POA: Diagnosis not present

## 2019-07-16 DIAGNOSIS — Z9221 Personal history of antineoplastic chemotherapy: Secondary | ICD-10-CM | POA: Diagnosis not present

## 2019-07-16 DIAGNOSIS — Z7901 Long term (current) use of anticoagulants: Secondary | ICD-10-CM | POA: Diagnosis not present

## 2019-07-16 DIAGNOSIS — D638 Anemia in other chronic diseases classified elsewhere: Secondary | ICD-10-CM

## 2019-07-16 DIAGNOSIS — N189 Chronic kidney disease, unspecified: Secondary | ICD-10-CM | POA: Insufficient documentation

## 2019-07-16 DIAGNOSIS — Z794 Long term (current) use of insulin: Secondary | ICD-10-CM | POA: Diagnosis not present

## 2019-07-16 DIAGNOSIS — Z170 Estrogen receptor positive status [ER+]: Secondary | ICD-10-CM | POA: Diagnosis not present

## 2019-07-16 DIAGNOSIS — D631 Anemia in chronic kidney disease: Secondary | ICD-10-CM | POA: Diagnosis not present

## 2019-07-16 DIAGNOSIS — Z79811 Long term (current) use of aromatase inhibitors: Secondary | ICD-10-CM | POA: Diagnosis not present

## 2019-07-16 DIAGNOSIS — C50211 Malignant neoplasm of upper-inner quadrant of right female breast: Secondary | ICD-10-CM | POA: Insufficient documentation

## 2019-07-16 DIAGNOSIS — J90 Pleural effusion, not elsewhere classified: Secondary | ICD-10-CM | POA: Diagnosis not present

## 2019-07-16 DIAGNOSIS — I129 Hypertensive chronic kidney disease with stage 1 through stage 4 chronic kidney disease, or unspecified chronic kidney disease: Secondary | ICD-10-CM | POA: Diagnosis present

## 2019-07-16 LAB — CBC WITH DIFFERENTIAL (CANCER CENTER ONLY)
Abs Immature Granulocytes: 0.01 10*3/uL (ref 0.00–0.07)
Basophils Absolute: 0.1 10*3/uL (ref 0.0–0.1)
Basophils Relative: 1 %
Eosinophils Absolute: 0.2 10*3/uL (ref 0.0–0.5)
Eosinophils Relative: 3 %
HCT: 31.8 % — ABNORMAL LOW (ref 36.0–46.0)
Hemoglobin: 9.3 g/dL — ABNORMAL LOW (ref 12.0–15.0)
Immature Granulocytes: 0 %
Lymphocytes Relative: 8 %
Lymphs Abs: 0.6 10*3/uL — ABNORMAL LOW (ref 0.7–4.0)
MCH: 29.8 pg (ref 26.0–34.0)
MCHC: 29.2 g/dL — ABNORMAL LOW (ref 30.0–36.0)
MCV: 101.9 fL — ABNORMAL HIGH (ref 80.0–100.0)
Monocytes Absolute: 1 10*3/uL (ref 0.1–1.0)
Monocytes Relative: 13 %
Neutro Abs: 5.5 10*3/uL (ref 1.7–7.7)
Neutrophils Relative %: 75 %
Platelet Count: 135 10*3/uL — ABNORMAL LOW (ref 150–400)
RBC: 3.12 MIL/uL — ABNORMAL LOW (ref 3.87–5.11)
RDW: 17.3 % — ABNORMAL HIGH (ref 11.5–15.5)
WBC Count: 7.4 10*3/uL (ref 4.0–10.5)
nRBC: 0 % (ref 0.0–0.2)

## 2019-07-16 LAB — SAMPLE TO BLOOD BANK

## 2019-07-16 MED ORDER — EPOETIN ALFA-EPBX 40000 UNIT/ML IJ SOLN
40000.0000 [IU] | Freq: Once | INTRAMUSCULAR | Status: AC
  Administered 2019-07-16: 40000 [IU] via SUBCUTANEOUS

## 2019-07-16 MED ORDER — EPOETIN ALFA-EPBX 40000 UNIT/ML IJ SOLN
INTRAMUSCULAR | Status: AC
  Filled 2019-07-16: qty 1

## 2019-07-16 NOTE — Patient Instructions (Signed)

## 2019-07-20 ENCOUNTER — Other Ambulatory Visit: Payer: Self-pay | Admitting: Hematology and Oncology

## 2019-07-22 LAB — ACID FAST CULTURE WITH REFLEXED SENSITIVITIES (MYCOBACTERIA): Acid Fast Culture: NEGATIVE

## 2019-07-23 ENCOUNTER — Inpatient Hospital Stay: Payer: Medicare Other

## 2019-07-23 ENCOUNTER — Ambulatory Visit (INDEPENDENT_AMBULATORY_CARE_PROVIDER_SITE_OTHER): Payer: Medicare Other | Admitting: *Deleted

## 2019-07-23 ENCOUNTER — Other Ambulatory Visit: Payer: Self-pay

## 2019-07-23 VITALS — BP 132/72 | HR 72 | Temp 98.2°F | Resp 18

## 2019-07-23 DIAGNOSIS — R55 Syncope and collapse: Secondary | ICD-10-CM

## 2019-07-23 DIAGNOSIS — D638 Anemia in other chronic diseases classified elsewhere: Secondary | ICD-10-CM

## 2019-07-23 DIAGNOSIS — I129 Hypertensive chronic kidney disease with stage 1 through stage 4 chronic kidney disease, or unspecified chronic kidney disease: Secondary | ICD-10-CM | POA: Diagnosis not present

## 2019-07-23 LAB — CBC WITH DIFFERENTIAL (CANCER CENTER ONLY)
Abs Immature Granulocytes: 0.01 10*3/uL (ref 0.00–0.07)
Basophils Absolute: 0.1 10*3/uL (ref 0.0–0.1)
Basophils Relative: 1 %
Eosinophils Absolute: 0.3 10*3/uL (ref 0.0–0.5)
Eosinophils Relative: 4 %
HCT: 31.6 % — ABNORMAL LOW (ref 36.0–46.0)
Hemoglobin: 9.2 g/dL — ABNORMAL LOW (ref 12.0–15.0)
Immature Granulocytes: 0 %
Lymphocytes Relative: 8 %
Lymphs Abs: 0.5 10*3/uL — ABNORMAL LOW (ref 0.7–4.0)
MCH: 30.2 pg (ref 26.0–34.0)
MCHC: 29.1 g/dL — ABNORMAL LOW (ref 30.0–36.0)
MCV: 103.6 fL — ABNORMAL HIGH (ref 80.0–100.0)
Monocytes Absolute: 0.7 10*3/uL (ref 0.1–1.0)
Monocytes Relative: 11 %
Neutro Abs: 5.1 10*3/uL (ref 1.7–7.7)
Neutrophils Relative %: 76 %
Platelet Count: 132 10*3/uL — ABNORMAL LOW (ref 150–400)
RBC: 3.05 MIL/uL — ABNORMAL LOW (ref 3.87–5.11)
RDW: 16.7 % — ABNORMAL HIGH (ref 11.5–15.5)
WBC Count: 6.7 10*3/uL (ref 4.0–10.5)
nRBC: 0 % (ref 0.0–0.2)

## 2019-07-23 LAB — CUP PACEART REMOTE DEVICE CHECK
Date Time Interrogation Session: 20210210234513
Implantable Pulse Generator Implant Date: 20190322

## 2019-07-23 LAB — SAMPLE TO BLOOD BANK

## 2019-07-23 MED ORDER — EPOETIN ALFA-EPBX 40000 UNIT/ML IJ SOLN
INTRAMUSCULAR | Status: AC
  Filled 2019-07-23: qty 1

## 2019-07-23 MED ORDER — EPOETIN ALFA-EPBX 40000 UNIT/ML IJ SOLN
40000.0000 [IU] | Freq: Once | INTRAMUSCULAR | Status: AC
  Administered 2019-07-23: 40000 [IU] via SUBCUTANEOUS

## 2019-07-23 NOTE — Patient Instructions (Signed)

## 2019-07-23 NOTE — Progress Notes (Signed)
ILR Remote 

## 2019-07-28 ENCOUNTER — Other Ambulatory Visit: Payer: Self-pay | Admitting: Surgery

## 2019-07-28 DIAGNOSIS — J90 Pleural effusion, not elsewhere classified: Secondary | ICD-10-CM

## 2019-07-29 ENCOUNTER — Encounter: Payer: Self-pay | Admitting: Surgery

## 2019-07-29 ENCOUNTER — Other Ambulatory Visit: Payer: Self-pay

## 2019-07-29 ENCOUNTER — Ambulatory Visit
Admission: RE | Admit: 2019-07-29 | Discharge: 2019-07-29 | Disposition: A | Discharge status: Home / Self Care | Payer: Medicare Other | Source: Ambulatory Visit | Attending: Surgery | Admitting: Surgery

## 2019-07-29 ENCOUNTER — Ambulatory Visit (INDEPENDENT_AMBULATORY_CARE_PROVIDER_SITE_OTHER): Payer: Medicare Other | Admitting: Surgery

## 2019-07-29 VITALS — BP 125/68 | HR 59 | Temp 97.4°F | Resp 16 | Ht 65.0 in | Wt 142.0 lb

## 2019-07-29 DIAGNOSIS — Z09 Encounter for follow-up examination after completed treatment for conditions other than malignant neoplasm: Secondary | ICD-10-CM | POA: Diagnosis not present

## 2019-07-29 DIAGNOSIS — J90 Pleural effusion, not elsewhere classified: Secondary | ICD-10-CM

## 2019-07-29 NOTE — Progress Notes (Signed)
HPI:  Hannah [name] returns today for follow-up status post drainage of a recurrent right pleural effusion and talc pleurodesis on 06/26/2019 and subsequent removal of Hannah [name] Pleurx catheter on 07/14/2019.  She has been feeling well overall.  She says she still has some shortness of breath with exertion related to Hannah [name] heart failure but is different than what she had before.  Overall she feels better.  Current Outpatient Medications  Medication Sig Dispense Refill  . acetaminophen (TYLENOL) 325 MG tablet Take 2 tablets (650 mg total) by mouth every 6 (six) hours as needed for mild pain (or Fever >/= 101).    Marland Kitchen albuterol (PROVENTIL HFA;VENTOLIN HFA) 108 (90 Base) MCG/ACT inhaler Inhale 2 puffs into the lungs every 6 (six) hours as needed for wheezing or shortness of breath.    Marland Kitchen amiodarone (PACERONE) 200 MG tablet Take 1 tablet (200 mg total) by mouth daily. 90 tablet 3  . apixaban (ELIQUIS) 5 MG TABS tablet Take 1 tablet (5 mg total) by mouth 2 (two) times daily. 180 tablet 2  . Blood Glucose Monitoring Suppl (ACCU-CHEK GUIDE ME) w/Device KIT Use accu chek meter to check blood sugar three times daily. 1 kit 0  . budesonide-formoterol (SYMBICORT) 160-4.5 MCG/ACT inhaler INHALE TWO PUFFS INTO THE LUNGS TWICE DAILY (Patient taking differently: Inhale 2 puffs into the lungs 2 (two) times daily. ) 3 Inhaler 1  . epoetin alfa-epbx (RETACRIT) 16109 UNIT/ML injection Inject 40,000 Units into the skin every Thursday.     Marland Kitchen exemestane (AROMASIN) 25 MG tablet TAKE ONE TABLET BY MOUTH DAILY AFTER BREAKFAST (Patient taking differently: Take 25 mg by mouth daily after breakfast. ) 90 tablet 3  . ezetimibe (ZETIA) 10 MG tablet TAKE ONE TABLET BY MOUTH EVERY DAY (Patient taking differently: Take 10 mg by mouth daily. ) 90 tablet 3  . famotidine (PEPCID) 20 MG tablet Take 1 tablet (20 mg total) by mouth 2 (two) times daily. One after supper (Patient taking differently: Take 20 mg by mouth 2 (two) times daily. ) 60  tablet 11  . furosemide (LASIX) 20 MG tablet Take 20 mg every other day. (Patient taking differently: Take 20 mg by mouth See admin instructions. Take 1 tablet (20 mg) by mouth every morning, may take an additional dose for weight gain of 3 lbs. or more) 30 tablet 2  . glucose blood (ACCU-CHEK GUIDE) test strip Use to test blood sugar 3 times daily 150 each 3  . HUMALOG KWIKPEN 100 UNIT/ML KwikPen INJECT SUBCUTANEOUSLY 4 TO  6 UNITS 3 TIMES DAILY  BEFORE MEALS (Patient taking differently: Inject 4-6 Units into the skin 3 (three) times daily after meals. ) 30 mL 3  . insulin glargine (LANTUS) 100 UNIT/ML injection Inject 16 units under the skin once daily in the morning. (Patient taking differently: Inject 18 Units into the skin daily. ) 20 mL 3  . Insulin Pen Needle 32G X 4 MM MISC Use as instructed to inject insulin 4 times daily. 400 each 3  . loratadine (CLARITIN) 10 MG tablet Take 10 mg by mouth daily as needed for allergies.     . metoprolol tartrate (LOPRESSOR) 25 MG tablet Take 1 tablet (25 mg total) by mouth 2 (two) times daily. 180 tablet 3  . midodrine (PROAMATINE) 2.5 MG tablet TAKE ONE TABLET BY MOUTH THREE TIMES DAILY WITH MEALS (Patient taking differently: Take 2.5 mg by mouth 3 (three) times daily with meals. ) 270 tablet 3  . nitroGLYCERIN (NITROSTAT) 0.4  MG SL tablet Place 1 tablet (0.4 mg total) under the tongue every 5 (five) minutes as needed for chest pain. 30 tablet 3  . oxyCODONE (ROXICODONE) 5 MG immediate release tablet Take 1 tablet (5 mg total) by mouth every 8 (eight) hours as needed. 20 tablet 0  . potassium chloride SA (K-DUR) 20 MEQ tablet Take 1 tablet (20 mEq total) by mouth daily. (Patient taking differently: Take 20 mEq by mouth daily as needed (based on labwork). ) 90 tablet 3  . rosuvastatin (CRESTOR) 40 MG tablet Take 1 tablet (40 mg total) by mouth at bedtime. 90 tablet 3  . zolpidem (AMBIEN) 5 MG tablet TAKE ONE TABLET BY MOUTH AT BEDTIME AS NEEDED FOR SLEEP  (Patient taking differently: Take 5 mg by mouth at bedtime. ) 14 tablet 0   No current facility-administered medications for this visit.     Physical Exam: BP 125/68 (BP Location: Left Arm, Patient Position: Sitting, Cuff Size: Normal)   Pulse (!) 59   Temp (!) 97.4 F (36.3 C)   Resp 16   Ht _0  (1.651 m)   Wt 142 lb (64.4 kg)   LMP  (LMP Unknown)   SpO2 99% Comment: RA16  BMI 23.63 kg/m  She looks well. Lung exam reveals decreased breath sounds at the right base. The right chest Pleurx site is well-healed. There is moderate chronic bilateral lower leg edema.  Diagnostic Tests:  CLINICAL DATA:  History of right pleural effusion. Status post removal of a pleural drainage catheter.  EXAM: CHEST - 2 VIEW  COMPARISON:  PA and lateral chest 07/08/2019.  FINDINGS: Pleural drainage catheter in the right chest has been removed. Small right pleural effusion and basilar atelectasis are unchanged. There is no left pleural effusion. The left lung is clear. The patient is status post CABG. Heart size is normal. Atherosclerosis is identified. Loop recorder is in place. Remote proximal humerus fractures bilaterally are seen.  IMPRESSION: Status post removal of a right pleural drainage catheter. Negative for pneumothorax.  No change in a small right pleural effusion and basilar atelectasis.  Atherosclerosis.   Electronically Signed   By: Inge Rise M.D.   On: 07/29/2019 08:44   Impression:  There has been no reaccumulation of fluid since the Pleurx catheter was removed.  There is some haziness at the right base which is unchanged and likely related to the talc pleurodesis and possibly a small loculated effusion.  This is of no significance and may always be there on chest x-ray.  The right lung is well expanded.  I reviewed the chest x-ray with Hannah [name] and Hannah [name] husband and answered their questions.  I do not think she requires any further chest x-rays unless she  develops further shortness of breath.  Plan:  She will continue to follow-up with Hannah [name] PCP and cardiology and I will be happy to see Hannah [name] back if she develops any recurrent pleural effusions.   Gaye Pollack, MD [facility] [phone]

## 2019-07-30 ENCOUNTER — Inpatient Hospital Stay: Payer: Medicare Other

## 2019-07-30 ENCOUNTER — Inpatient Hospital Stay: Payer: Medicare Other | Admitting: Hematology and Oncology

## 2019-07-30 NOTE — Assessment & Plan Note (Deleted)
Patient has had longstanding anemia where the hemoglobin was between 10 to 12 g. Since April 2020 her hemoglobin has gotten worse and today it is at 6.6. During the same timeframe her creatinine got from 4.3-0.14 Y40 folic acid and iron studies were normal. 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  Current treatment:Retacritinjectionsweeklystarted7/31/2020 Bone marrow biopsy 04/08/2019: Normocellular bone marrow with trilineage hematopoiesis, absent iron stores, flow cytometry no abnormal B or T-cell clonal abnormalities noted.   IV iron therapy:Venofer weekly x4 04/24/19-05/15/19  CT chest: Moderate pleural effusion, right breast changes Ultrasound-guided thoracentesis 05/15/2019: 600 cc of yellow fluid removed  Hardening of breast tissue: Biopsy Fat necrosis  Continue weekly Retacrit for Hb < 10 ----------------------------------------------------------- Severe shortness of breath: Pleurex catheter (effusion related to HF)  Plan:  1. Retacrit injections: Todays Hb is 9.6 Will proceed with retacrit Inj today 2. Supportive care with blood transfusions as needed

## 2019-07-30 NOTE — Progress Notes (Signed)
CARDIOLOGY OFFICE NOTE  Date:  08/03/2019    Hannah [name] Date of Birth: 08/23/1945 Medical Record #[id number]  PCP:  [name], DO  Cardiologist:  Atilano Median    Chief Complaint  Patient presents with  . Follow-up    History of Present Illness: Hannah [name] is a 74 y.o. female who presents today for a one month check.  Seen for Dr. Lovena Le.Primarily follows with me.  She has a very complex/extensivehistory - this includes ahistory ofCAD s/p CABG (1998 with redo 2009- Dr. Cyndia Bent), bioprosthetic AVR in 1007, chronic diastolic CHF, asthma, carotid artery disease (1-39% by duplex 10/2017, due 2021), CKD stage IV(Dr. Posey Pronto), PAF- has been on Multaq in the past and now on amiodarone, atrial tachycardia, breast CA, orthostasis,chronicanemia, thrombocytopenia, mild dilation of ascending aorta by echo 10/2018, recurrent syncope/orthostasis,recurrentrightpleural effusion requiring thoracentesis4/2020and 05/2019, HTN, GERD,&HLD.   I have followed her for many years - she has had periodic issues with volume overload, orthostasis and tachy palpitations.Last cath in 2014 - grafts patent. Myoview in 2015 without ischemia.  Over the past few months she has had more issues with her pleural effusion - diuretics caused worsening CKD/orthostasis- resulting in injury. She has gotten more anemic(down to 6)- back to hematology and now on therapy. Concern for MDS andhas hadbone marrow biopsy - wasalso needing colonoscopy due to profound anemia (HGB down to 6 which is highly unusual for Hannah [name]).Most recent abdominal ultrasound noted and concerning for liver abnormality. Recent mammogram also recently noted and abnormal - ended up havingmultiplerepeat biopsies that were negative.  Most recent course has beenprettyeventful - she has hadto be placed back onlow dose diuretics forarecurring effusion.Pulmonary wished for me to dose her diuretics due to her fragile  nature with orthostasis/CKD.Progressive failure to thrive. Has been back to the hospital in the interim- treated for CHF and pneumonia. Remainsmarkedlyanemic - needed colonoscopy. Has had abnormal mammogram with 4subsequent negativebiopsies. At one visit in September - she was coming in here and got dizzy/fell in the parking loton the curband had significant laceration to the back of her her head. ILR interrogation was ok at that time. Have added back Midodrine for orthostasisto prevent injury.She has been slow to recoverand make progress. Has seen nephrology who thought her kidney function was stable. Ended up having CT in place of colonoscopy per GI.  She has had recurrent effusion - referred to Dr. Cyndia Bent and she had Pleurex cath placement with talc and subsequent removal with improvement. Cytologies on prior taps negative. We got her echo updated as well - EF remains normal. Last seen a month ago - seemed overall to be stable.   The patient does not have symptoms concerning for COVID-19 infection (fever, chills, cough, or new shortness of breath).   Comes in today. Here with [name] - her husband. Her shortness of breath is somewhat better - she tells me she can tell the difference between the fluid in her lung and her heart failure. She is taking some Lasix every day. Not dizzy. No chest pain. Blood count right around 9. She is more active some days - then has some fatigue. She clearly has to pace herself and try to not overdo. Now has a kidney stone - sounds like will be needing a procedure (?lithotripsy) and to hold her Eliquis. She does have some intermittent chest pain - occurs with and without exertion - no NTG use - not getting any worse and seems to be at her baseline -  certainly not any worse. Does not happen every day but does "get her attention". She is intolerant to Imdur from the past. She feels like this she is at her baseline. She is not dizzy. No falls. No passing out. She  does not feel like she has the confidence to drive and certainly to not go and walk outside. She [facility] the [facility] earlier - HGB is 9.2 today.   Past Medical History:  Diagnosis Date  . Abnormally small mouth   . Allergic rhinitis 10/14/2009   Qualifier: Diagnosis of  By: Lamonte Sakai MD, Rose Fillers   Overview:  Overview:  Qualifier: Diagnosis of  By: Lamonte Sakai MD, Rose Fillers  Last Assessment & Plan:  Please continue Xyzal and Nasacort as you have been using them  . Anemia   . Asthma 05/12/2009   10/12/2014 p extensive coaching HFA effectiveness =    75% s spacer    Overview:  Overview:  10/12/2014 p extensive coaching HFA effectiveness =    75% s spacer   Last Assessment & Plan:  Please continue Symbicort 2 puffs twice a day. Remember to rinse and gargle after taking this medication. Take albuterol 2 puffs up to every 4 hours if needed for shortness of breath.  Follow with Dr Lamonte Sakai in 6 month  . Bilateral carotid artery stenosis    a. mild 1-39% by duplex 10/2017, due 2021.  . Breast cancer ([place]) 01/18/2016   right breast  . CAD (coronary artery disease) 01/24/2011   a. s/p CABG 1998 with redo 2009.  Marland Kitchen Chemotherapy induced nausea and vomiting 02/24/2016  . Chemotherapy-induced peripheral neuropathy ([place]) 04/27/2016  . 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 Chronic diastolic CHF (congestive heart failure) ([place]) 09/24/2013  . CKD (chronic kidney disease), stage IV ([place]) 09/24/2013  . Closed fracture of head of left humerus 09/09/2017  . Complication of anesthesia   . Dysrhythmia   . Essential hypertension 05/12/2009   Qualifier: Diagnosis of  By: Lamonte Sakai MD, Rose Fillers   . Gallstones   . GERD (gastroesophageal reflux disease)   . Glaucoma   . H/O atrial tachycardia 05/12/2009   Qualifier: History of  By: Lamonte Sakai MD, Rose Fillers   Overview:  Overview:  Qualifier: History of  By: Lamonte Sakai MD, Rose Fillers  Last Assessment & Plan:  There was no evidence of this on her cardiac monitor. I would recommend watchful  waiting.   Marland Kitchen Heart murmur   . History of kidney stones   . History of non-ST elevation myocardial infarction (NSTEMI)    Sept 2014--  thought to be type II HTN w/ LHC without infarct related artery and patent grafts  . History of radiation therapy 05/24/16-07/26/16   right breast 50.4 Gy in 28 fractions, right breast boost 10 Gy in 5 fractions  . Hyperlipidemia   . Hypertension    Resolved.  . Iron deficiency anemia   . Mania ([place]) 03/05/2016  . Moderate persistent asthma    pulmologist-  Dr. Malvin Johns  . Osteomyelitis of toe of left foot ([place]) 04/06/2016  . Osteopenia of multiple sites 10/19/2015  . PAF (paroxysmal atrial fibrillation) ([place])   . Personal history of chemotherapy 2017  . Personal history of radiation therapy 2017  . PONV (postoperative nausea and vomiting)   . Port catheter in place 02/17/2016  . Psoriasis    right leg  . Renal calculus, right   . S/P AVR    prosthesis valve placement 2009 at same time re-do CABG  .  Seizure-like activity ([place]) 09/01/2017  . Sensorineural hearing loss (SNHL) of both ears 01/06/2016  . Stroke [facility])    residual rt hearing loss  . Syncope 09/06/2017  . Type 2 diabetes mellitus ([place])    monitored by dr Dwyane Dee    Past Surgical History:  Procedure Laterality Date  . AORTIC VALVE REPLACEMENT  2009   [facility] (done same time is CABG)  . BREAST LUMPECTOMY Right 01/18/2016  . BREAST LUMPECTOMY WITH RADIOACTIVE SEED AND SENTINEL LYMPH NODE BIOPSY Right 01/18/2016   Procedure: RIGHT BREAST LUMPECTOMY WITH RADIOACTIVE SEED AND SENTINEL LYMPH NODE BIOPSY;  Surgeon: DAlphonsa Overall MD;  [facility]  Service: General;  Laterality: Right;  . CARDIAC CATHETERIZATION  03/23/2008   Pre-redo CABG: L main OK, LAD (T), CFX (T), OM1 99%, RCA (T), LIMA-LAD OK, SVG-OM(?3) OK w/ little florw to OM2, SVG-RCA OK. EF NL  . CARPAL TUNNEL RELEASE    . CHEST TUBE INSERTION Right 06/26/2019   Procedure: INSERTION PLEURAL DRAINAGE  CATHETER;  Surgeon: BGaye Pollack MD;  [place] [facility]  Service: Thoracic;  Laterality: Right;  . CHOLECYSTECTOMY N/A 07/07/2018   Procedure: LAPAROSCOPIC CHOLECYSTECTOMY WITH INTRAOPERATIVE CHOLANGIOGRAM ERAS PATHWAY;  Surgeon: NAlphonsa Overall MD;  Location: [facility];  Service: General;  Laterality: N/A;  . COLONOSCOPY     around 2015. Possibly with [place]   . CORONARY ARTERY BYPASS GRAFT  1998 &  re-do 2009   Had LIMA to DX/LAD, SVG [facility] 2 marginal [facility] SVG to 3rd OM and PD at time of redo  . CYSTOSCOPY W/ URETERAL STENT PLACEMENT Right 12/20/2014   Procedure: CYSTOSCOPY WITH RETROGRADE PYELOGRAM/URETERAL STENT PLACEMENT;  Surgeon: PCleon Gustin MD;  [facility]  Service: Urology;  Laterality: Right;  . ESOPHAGOGASTRODUODENOSCOPY     many years ago per patient   . ESOPHAGOGASTRODUODENOSCOPY ENDOSCOPY  06/17/2018  . EYE SURGERY Bilateral    cataracts  . HOLMIUM LASER APPLICATION Right 72/44/0102  Procedure:  HOLMIUM LASER LITHOTRIPSY;  Surgeon: PCleon Gustin MD;  [facility]  Service: Urology;  Laterality: Right;  . IR THORACENTESIS ASP PLEURAL SPACE W/IMG GUIDE  10/03/2018  . IR THORACENTESIS ASP PLEURAL SPACE W/IMG GUIDE  06/08/2019  . LEFT HEART CATHETERIZATION WITH CORONARY/GRAFT ANGIOGRAM N/A 02/23/2013   Procedure: LEFT HEART CATHETERIZATION WITH CBeatrix Fetters  Surgeon: MBlane Ohara MD;  [facility];  Service: Cardiovascular;  Laterality: N/A;  . LOOP RECORDER INSERTION N/A 08/30/2017   Procedure: LOOP RECORDER INSERTION;  Surgeon: TEvans Lance MD;  [facility];  Service: Cardiovascular;  Laterality: N/A;  . PORTACATH PLACEMENT Left 01/18/2016   Procedure: INSERTION PORT-A-CATH;  Surgeon: DAlphonsa Overall MD;  [place] [facility] [place]  Service: General;  Laterality: Left;  . portacath removal    . REMOVAL OF PLEURAL DRAINAGE CATHETER Right 07/14/2019   Procedure:  REMOVAL OF PLEURAL DRAINAGE CATHETER;  Surgeon: BGaye Pollack MD;  [place] [facility]  Service: Thoracic;  Laterality: Right;  . TALC PLEURODESIS N/A 06/26/2019   Procedure: TPietro Cassis  Surgeon: BGaye Pollack MD;  Location[facility];  Service: Thoracic;  Laterality: N/A;  . TONSILLECTOMY    . TRANSTHORACIC ECHOCARDIOGRAM  02-24-2013      mild LVH,  ef 50-55%/  AV bioprosthesis was present with very mild stenosis and no regurg., mean grandient 142mg, peak grandient 2035m /  mild MR/  mild LAE and  RAE/  moderate TR  . TUBAL LIGATION       Medications: Current Meds  Medication Sig  . acetaminophen (TYLENOL) 325 MG tablet Take 2 tablets (650 mg total) by mouth every 6 (six) hours as needed for mild pain (or Fever >/= 101).  Marland Kitchen albuterol (PROVENTIL HFA;VENTOLIN HFA) 108 (90 Base) MCG/ACT inhaler Inhale 2 puffs into the lungs every 6 (six) hours as needed for wheezing or shortness of breath.  Marland Kitchen amiodarone (PACERONE) 200 MG tablet Take 1 tablet (200 mg total) by mouth daily.  Marland Kitchen apixaban (ELIQUIS) 5 MG TABS tablet Take 1 tablet (5 mg total) by mouth 2 (two) times daily.  . Blood Glucose Monitoring Suppl (ACCU-CHEK GUIDE ME) w/Device KIT Use accu chek meter to check blood sugar three times daily.  . budesonide-formoterol (SYMBICORT) 160-4.5 MCG/ACT inhaler Inhale 2 puffs into the lungs 2 (two) times daily.  Marland Kitchen epoetin alfa-epbx (RETACRIT) 22297 UNIT/ML injection Inject 40,000 Units into the skin every Thursday.   Marland Kitchen exemestane (AROMASIN) 25 MG tablet Take 25 mg by mouth daily after breakfast.  . ezetimibe (ZETIA) 10 MG tablet Take 10 mg by mouth daily.  . famotidine (PEPCID) 20 MG tablet Take 20 mg by mouth 2 (two) times daily.  . furosemide (LASIX) 20 MG tablet Take 20 mg by mouth.  Marland Kitchen glucose blood (ACCU-CHEK GUIDE) test strip Use to test blood sugar 3 times daily  . insulin glargine (LANTUS) 100 UNIT/ML injection Inject 18 Units into the skin daily.  . Insulin Lispro (HUMALOG KWIKPEN [place])  Inject 4-6 Units into the skin 3 (three) times daily.  . Insulin Pen Needle 32G X 4 MM MISC Use as instructed to inject insulin 4 times daily.  Marland Kitchen loratadine (CLARITIN) 10 MG tablet Take 10 mg by mouth daily as needed for allergies.   . metoprolol tartrate (LOPRESSOR) 25 MG tablet Take 1 tablet (25 mg total) by mouth 2 (two) times daily.  . midodrine (PROAMATINE) 2.5 MG tablet Take 2.5 mg by mouth 3 (three) times daily with meals.  . nitroGLYCERIN (NITROSTAT) 0.4 MG SL tablet Place 1 tablet (0.4 mg total) under the tongue every 5 (five) minutes as needed for chest pain.  Marland Kitchen oxyCODONE (ROXICODONE) 5 MG immediate release tablet Take 1 tablet (5 mg total) by mouth every 8 (eight) hours as needed.  . potassium chloride SA (KLOR-CON) 20 MEQ tablet Take 20 mEq by mouth daily.  . rosuvastatin (CRESTOR) 40 MG tablet Take 1 tablet (40 mg total) by mouth at bedtime.  Marland Kitchen zolpidem (AMBIEN) 5 MG tablet Take 5 mg by mouth at bedtime as needed for sleep.  . [DISCONTINUED] midodrine (PROAMATINE) 2.5 MG tablet TAKE ONE TABLET BY MOUTH THREE TIMES DAILY WITH MEALS (Patient taking differently: Take 2.5 mg by mouth 3 (three) times daily with meals. )     Allergies: Allergies  Allergen Reactions  . Amoxicillin Rash and Other (See Comments)    Tolerates Cephalosporins Has patient had a PCN reaction causing immediate rash, facial/tongue/throat swelling, SOB or lightheadedness with hypotension: Yes Has patient had a PCN reaction causing severe rash involving mucus membranes or skin necrosis: Yes Has patient had a PCN reaction that required hospitalization No Has patient had a PCN reaction occurring within the last 10 years: No If all of the above answers are "NO", then may proceed with Cephalosporin use.   . Tape Other (See Comments)    Must use paper tape  . Aldactone [Spironolactone] Other (See Comments)    CKD/hypokalemia  .  Imdur [Isosorbide Dinitrate] Other (See Comments)    Headache/severe  hypotension/Syncope  . Arimidex [Anastrozole] Nausea Only  . Latex Itching and Other (See Comments)    (Dentist office)  . Tetracycline Rash    Social History: The patient  reports that she has never smoked. She has never used smokeless tobacco. She reports that she does not drink alcohol or use drugs.   Family History: The patient's family history includes Diabetes in her maternal grandmother and son; Healthy in her brother; Heart attack in her brother; Heart disease in her brother, father, and maternal grandmother; Heart failure in her father.   Review of Systems: Please see the history of present illness.   All other systems are reviewed and negative.   Physical Exam: VS:  BP (!) 110/58   Pulse (!) 58   Ht _0  (1.651 m)   Wt 145 lb 6.4 oz (66 kg)   LMP  (LMP Unknown)   SpO2 99%   BMI 24.20 kg/m  .  BMI Body mass index is 24.2 kg/m.  Wt Readings from Last 3 Encounters:  08/03/19 145 lb 6.4 oz (66 kg)  08/03/19 145 lb 3.2 oz (65.9 kg)  07/29/19 142 lb (64.4 kg)    General: Pleasant. Well developed, well nourished and in no acute distress.   HEENT: Normal.  Neck: Supple, no JVD, carotid bruits, or masses noted.  Cardiac: Regular rate and rhythm. No murmurs, rubs, or gallops. No edema.  Respiratory:  Lungs are clear to auscultation bilaterally with normal work of breathing.  GI: Soft and nontender.  MS: No deformity or atrophy. Gait and ROM intact.  Skin: Warm and dry. Color is normal.  Neuro:  Strength and sensation are intact and no gross focal deficits noted.  Psych: Alert, appropriate and with normal affect.   LABORATORY DATA:  EKG:  EKG is ordered today. This demonstrates sinus bradycardia - borderline 1st degree AV block. HR is 58 today.   Lab Results  Component Value Date   WBC 5.5 08/03/2019   HGB 9.4 (L) 08/03/2019   HCT 32.6 (L) 08/03/2019   PLT 121 (L) 08/03/2019   GLUCOSE 232 (H) 07/02/2019   CHOL 106 12/02/2018   TRIG 97 12/02/2018   HDL 34 (L)  12/02/2018   LDLDIRECT 53.0 10/08/2016   LDLCALC 53 12/02/2018   ALT 39 06/24/2019   AST 61 (H) 06/24/2019   NA 140 06/24/2019   K 3.7 06/24/2019   CL 104 06/24/2019   CREATININE 1.95 (H) 06/24/2019   BUN 31 (H) 06/24/2019   CO2 28 06/24/2019   TSH 3.040 04/21/2019   INR 1.4 (H) 06/24/2019   HGBA1C 7.1 (H) 07/02/2019   MICROALBUR 14.2 (H) 07/02/2019     BNP (last 3 results) Recent Labs    03/04/19 1429  BNP 273.4*    ProBNP (last 3 results) Recent Labs    10/28/18 1041 02/19/19 1126 02/23/19 1221  PROBNP 364.0* 2,996* 2,825*     Other Studies Reviewed Today:  ECHO IMPRESSIONS 05/2019  1. Left ventricular ejection fraction, by visual estimation, is 60 to 65%. The left ventricle has normal function. Left ventricular septal wall thickness was normal. Normal left ventricular posterior wall thickness. There is no left ventricular  hypertrophy. 2. Elevated left ventricular end-diastolic pressure. 3. Left ventricular diastolic parameters are consistent with Grade II diastolic dysfunction (pseudonormalization). 4. Global right ventricle has normal systolic function.The right ventricular size is normal. No increase in right ventricular wall thickness. 5. Left atrial size  was severely dilated. 6. Right atrial size was normal. 7. Mild mitral annular calcification. 8. Mild thickening of the anterior and posterior mitral valve leaflet(s). 9. The mitral valve is normal in structure. Mild mitral valve regurgitation. No evidence of mitral stenosis. 10. The tricuspid valve is normal in structure. 11. Aortic valve regurgitation is not visualized. No evidence of aortic valve sclerosis or stenosis. 12. The pulmonic valve was normal in structure. Pulmonic valve regurgitation is trivial. 13. Normal pulmonary artery systolic pressure. 14. The inferior vena cava is normal in size with greater than 50% respiratory variability, suggesting right atrial pressure of 3 mmHg.  CT  CHESTIMPRESSION11/2020: Moderate right pleural effusion, increased from prior CT. Associated right lower lobe atelectasis.  No evidence of pneumonia.  1.2 x 2.4 cm lesion along the inferior aspect of the right breast/chest wall. While additional findings noted above correlate with areas of fat necrosis, this lesion was not clearly identified when correlating with prior breast tomography report. Breast imaging consultation is suggested for further evaluation, including potential ultrasound with biopsy versus breast MR. These results will be called to the ordering clinician or representative by the Radiologist Assistant, and communication documented in the PACS or zVision Dashboard.  Aortic Atherosclerosis (ICD10-I70.0).   Electronically Signed By: Julian Hy M.D. On: 05/06/2019 18:11   CT ABDOMENIMPRESSION10/2020: Chest Impression:  1. Mild RIGHT basilar atelectasis and small RIGHT effusion. 2. Increased skin thickening in the lateral RIGHT breast. Recommend clinical correlation. 3. Stable seroma in the RIGHT breast.  Abdomen / Pelvis Impression:  1. No acute findings in the abdomen pelvis. 2. Nonobstructing calculi in the LEFT renal pelvis. 3. Degenerative endplate change in the lumbar spine similar to comparison CT 2019. 4. Coronary artery calcification and Aortic Atherosclerosis (ICD10-I70.0).   Electronically Signed By: Suzy Bouchard M.D. On: 03/17/2019 09:59   CXRIMPRESSION9/04/2019: Small right effusion and right lower lobe airspace disease unchanged. Negative for edema.   Electronically Signed By: Franchot Gallo M.D. On: 02/20/2019 15:39   ABD [place] 02/17/2019 Other findings: There is a somewhat lobulated fluid collection identified along the inferior aspect of the liver. This measures 2.6 x 3.4 x 4.3 cm. This was not seen on the prior CT examination and may be related to a focal fluid collection from  prior cholecystectomy. Mild perihepatic ascites is noted.  Note is made of right-sided pleural effusion.  IMPRESSION: Status post cholecystectomy. There is a lobulated cystic area as described above along the inferior right hepatic margin. This could be related to the prior surgery possibly representing a small seroma. CT may be helpful for further evaluation.  Small right pleural effusion.  Mild ascites.  Increased echogenicity within the liver likely related to fatty infiltration or underlying hepatocellular disease.  Electronically Signed By: Inez Catalina M.D. On: 02/17/2019 10:18  Breast Ultrasound 01/2019 IMPRESSION: 1. New irregular hypoechoic mass along the posterior margin of the scar right breast 8 o'clock position. Findings are nonspecific however concerning for the possibility of localized recurrence. 2. Within the outer aspect of the right axilla there are two adjacent irregular hypoechoic masses just deep to the skin which are nonspecific. These may represent focal fat necrosis or potentially recurrent or metastatic disease.  RECOMMENDATION: 1. Ultrasound-guided core needle biopsy right breast mass 8 o'clock position just deep to the scar. 2. Ultrasound-guided core needle biopsy of both irregular hypoechoic masses within the outer aspect of the right axilla.  MYOVIEW FINDINGS FROM 06/2013: Normal resting EKG. Slight ST depressions noted in aVL after administration of lexiscan. Mild  shortness of breath and nausea resolved after the test. EKG is nondiagnostic for ischemia. TID ratio 1.05. Lung-heart ratio 0.43. Normal ventricular chamber size.  IMPRESSION: No evidence for ischemia. Normal wall motion. LVEF 72%.  Electronically Signed By: Guy Sandifer   Cardiac Cath: 02/23/2013 Left mainstem: Normal  Left anterior descending (LAD): Severe proximal and mid calcification with long proximal 95% stenosis. The mid and distal vessel is  small but free of high grade disease.  Left circumflex (LCx):AV groove has a mid 90% stenosis before a moderate sized MOM. OM1 and OM2 are occluded at the ostium and fill via the SVG. OM3 occluded at the ostium and fills via SVG. The grafted OMs are small and diffusely disease.  Right coronary artery (RCA): Occluded in the mid vessel. The PDA is moderate sized and occluded at the ostium. There is a 70% stenosis in the PDA after the insertion of the vein graft.  Grafts:  LIMA to LAD: Patent  SVG to RCA: Occluded (from original CABG)  SVG to RCA: Patent (from redo CABG). This is mild diffuse plaque within the vein graft.  SVG to OM3: Patent. This is mild diffuse plaque within the vein graft.  SVG sequential to OM1/OM2: Patent. There is ostial 50% stenosis. There is a patent proximal SVG stent. The native marginals are very small.  Left ventriculography: LV not injected. AVR not crossed.  Final Conclusions: Severe native vessel CAD. Patent grafts as described with nonobstructive disease in the grafts. A stent placed into the sequential SVG is patent with some disease at the ostium that on several views in not occlusive. I did compare the 2009 cath with results today. There is high grade disease in the circumflex AV groove. However, this is not changed from 2009. However, this lesion does appear to lead into a non grafted OM. If she has further symptoms I would consider PCI of the native circumflex.     Assessment/Plan:  1.Prior pleural effusion - she has had talc/pleurex - now out with stable CXR.   2. CAD - prior CABG as well as redo CABG from 2009 - last cath in 2014 - managed medically. She does have some intermittent chest pain - no NTG use - short lived spells - certainly do not seem to be any worse or changing. Her overall situation is quite complex - she is intolerance to Imdur - she feels like she is at her baseline - she is actually doing more in the way of activity now that we have her  effusion issue resolved. She would be felt to be high risk for cardiac cath due to her CKD.   3. Orthostatic hypotension - on Midodrine - BP is ok - she has had prior injury with fractured shoulder and prior head lacertaion.   4. Chronic diastolic HF - weight is stable - she is managing her dose of lasix.   5. Chronic anemia - slow improvement noted. I think this helps her overall situation.   6. Prior AVR - most recent echo noted.   7. CKD - stage IV - she has had recent visit with Dr. Posey Pronto and she said this was stable. Lab today.   8. PAF - in sinus by exam and EKG. Remains on low dose amiodarone. Needs surveillance lab today.   9.Carotid disease-noted 1 to 39% bilateral stenosis from study 10/2017 -will need repeat study 10/2019- not discussed today  10. Prior breast cancer - recent abnormal mammogram - s/pmultiplebiopsies.Abnormality noted on the CT -  she has had recent biopsies that were negative.This has been reviewedpreviouslywith Dr. Lindi Adie and I defer further imaging to Dr. Lindi Adie.  11. Kidney stone - probably going to need what sounds like lithotripsy. She will be at increased risk - but we do not need for this to result in hydronephrosis with worsening CKD - she understands she is at higher risk just given her overall situation. She is in sinus - it would be ok to hold Eliquis for 2 days.   12. COVID-19 Education: The signs and symptoms of COVID-19 were discussed with the patient and how to seek care for testing (follow up with PCP or arrange E-visit).  The importance of social distancing, staying at home, hand hygiene and wearing a mask when out in public were discussed today.  Current medicines are reviewed with the patient today.  The patient does not have concerns regarding medicines other than what has been noted above.  The following changes have been made:  See above.  Labs/ tests ordered today include:    Orders Placed This Encounter  Procedures  .  Basic metabolic panel  . Hepatic function panel  . TSH  . EKG 12-Lead     Disposition:   FU with me in 4 to 6 weeks.   Patient is agreeable to this plan and will call if any problems develop in the interim.   SignedTruitt Merle, NP  08/03/2019 4:38 PM  [facility] [address] [place], [place]  [address] Phone: [phone] Fax: [phone]

## 2019-07-31 ENCOUNTER — Telehealth: Payer: Self-pay | Admitting: Hematology and Oncology

## 2019-07-31 NOTE — Telephone Encounter (Signed)
I talk with patient regarding reschedule °

## 2019-08-02 NOTE — Progress Notes (Signed)
Patient Care Team: [name], DO as PCP - General [name], NP as PCP - Cardiology (Nurse Practitioner) Evans Lance, MD as PCP - Electrophysiology (Cardiology) Alphonsa Overall, MD as Consulting Physician (General Surgery) Nicholas Lose, MD as Consulting Physician (Hematology and Oncology) Gery Pray, MD as Consulting Physician (Radiation Oncology) Evans Lance, MD as Consulting Physician (Cardiology) Collene Gobble, MD as Consulting Physician (Pulmonary Disease) Elayne Snare, MD as Consulting Physician (Endocrinology) Delice Bison, Charlestine Massed, NP as Nurse Practitioner (Hematology and Oncology) Ardelle Balls., MD (Neurology) Izora Gala, MD as Consulting Physician (Otolaryngology) Trula Slade, DPM as Consulting Physician (Podiatry)  DIAGNOSIS:    ICD-10-CM   1. Malignant neoplasm of upper-inner quadrant of right breast in female, estrogen receptor positive (Hannah [name])  C50.211    Z17.0     SUMMARY OF ONCOLOGIC HISTORY: Oncology History  Breast cancer of upper-inner quadrant of right female breast (Hannah [name])  12/14/2015 Initial Diagnosis   Right breast biopsy 12:30 position: 2 masses, 1.6 cm mass: Invasive ductal carcinoma, grade 2, ER 0%, PR 0%, HER-2 negative, Ki-67 70%; satellite mass 8 mm: IDC grade 2 ER 5%, PR 5%, HER-2 negative, Ki-67 50%; T1cN0 stage IA clinical stage   01/18/2016 Surgery   Right lumpectomy: Multifocal IDC grade 3, 1.9 cm  ER 0%, PR 0%, HER-2 negative, Ki-67 70% and 0.8 cm satellite mass ER 5%, PR 2%, HER-2 negative, Ki-67 50%, high-grade DCIS, margins negative, 0/1 lymph nodes negative T1 cN0 stage IA   02/17/2016 - 04/06/2016 Chemotherapy   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 with above-mentioned history of right breast cancer treated with lumpectomy,adjuvant chemotherapy,radiation, and whois currently on antiestrogen therapywith exemestane.Shealsohas a history of anemiaandis currently receiving Retacritinjectionsand IV iron.Shepresents to the clinic today forfollow-up. Her breathing is improving.  Chest tube has been removed.  Pleurodesis appears to have worked.  She now has a problem with kidney stone.  ALLERGIES:  is allergic to amoxicillin; tape; aldactone [spironolactone]; imdur [isosorbide dinitrate]; arimidex [anastrozole]; latex; and tetracycline.  MEDICATIONS:  Current Outpatient Medications  Medication Sig Dispense Refill  . acetaminophen (TYLENOL) 325 MG tablet Take 2 tablets (650 mg total) by mouth every 6 (six) hours as needed for mild pain (or Fever >/= 101).    Marland Kitchen albuterol (PROVENTIL HFA;VENTOLIN HFA) 108 (90 Base) MCG/ACT inhaler Inhale 2 puffs into the lungs every 6 (six) hours as needed for wheezing or shortness of breath.    Marland Kitchen amiodarone (PACERONE) 200 MG tablet Take 1 tablet (200 mg total) by mouth daily. 90 tablet 3  . apixaban (ELIQUIS) 5 MG TABS tablet Take 1 tablet (5 mg total) by mouth 2 (two) times daily. 180 tablet 2  . Blood Glucose Monitoring Suppl (ACCU-CHEK GUIDE ME) w/Device KIT Use accu chek meter to check blood sugar three times daily. 1 kit 0  . budesonide-formoterol (SYMBICORT) 160-4.5 MCG/ACT inhaler INHALE TWO PUFFS INTO THE LUNGS TWICE DAILY (Patient taking differently: Inhale  2 puffs into the lungs 2 (two) times daily. ) 3 Inhaler 1  . epoetin alfa-epbx (RETACRIT) 56314 UNIT/ML injection Inject 40,000 Units into the skin  every Thursday.     Marland Kitchen exemestane (AROMASIN) 25 MG tablet TAKE ONE TABLET BY MOUTH DAILY AFTER BREAKFAST (Patient taking differently: Take 25 mg by mouth daily after breakfast. ) 90 tablet 3  . ezetimibe (ZETIA) 10 MG tablet TAKE ONE TABLET BY MOUTH EVERY DAY (Patient taking differently: Take 10 mg by mouth daily. ) 90 tablet 3  . famotidine (PEPCID) 20 MG tablet Take 1 tablet (20 mg total) by mouth 2 (two) times daily. One after supper (Patient taking differently: Take 20 mg by mouth 2 (two) times daily. ) 60 tablet 11  . furosemide (LASIX) 20 MG tablet Take 20 mg every other day. (Patient taking differently: Take 20 mg by mouth See admin instructions. Take 1 tablet (20 mg) by mouth every morning, may take an additional dose for weight gain of 3 lbs. or more) 30 tablet 2  . glucose blood (ACCU-CHEK GUIDE) test strip Use to test blood sugar 3 times daily 150 each 3  . HUMALOG KWIKPEN 100 UNIT/ML KwikPen INJECT SUBCUTANEOUSLY 4 TO  6 UNITS 3 TIMES DAILY  BEFORE MEALS (Patient taking differently: Inject 4-6 Units into the skin 3 (three) times daily after meals. ) 30 mL 3  . insulin glargine (LANTUS) 100 UNIT/ML injection Inject 16 units under the skin once daily in the morning. (Patient taking differently: Inject 18 Units into the skin daily. ) 20 mL 3  . Insulin Pen Needle 32G X 4 MM MISC Use as instructed to inject insulin 4 times daily. 400 each 3  . loratadine (CLARITIN) 10 MG tablet Take 10 mg by mouth daily as needed for allergies.     . metoprolol tartrate (LOPRESSOR) 25 MG tablet Take 1 tablet (25 mg total) by mouth 2 (two) times daily. 180 tablet 3  . midodrine (PROAMATINE) 2.5 MG tablet TAKE ONE TABLET BY MOUTH THREE TIMES DAILY WITH MEALS (Patient taking differently: Take 2.5 mg by mouth 3 (three) times daily with meals. ) 270 tablet 3  . nitroGLYCERIN (NITROSTAT) 0.4 MG SL tablet Place 1 tablet (0.4 mg total) under the tongue every 5 (five) minutes as needed for chest pain. 30 tablet 3  .  oxyCODONE (ROXICODONE) 5 MG immediate release tablet Take 1 tablet (5 mg total) by mouth every 8 (eight) hours as needed. 20 tablet 0  . potassium chloride SA (K-DUR) 20 MEQ tablet Take 1 tablet (20 mEq total) by mouth daily. (Patient taking differently: Take 20 mEq by mouth daily as needed (based on labwork). ) 90 tablet 3  . rosuvastatin (CRESTOR) 40 MG tablet Take 1 tablet (40 mg total) by mouth at bedtime. 90 tablet 3  . zolpidem (AMBIEN) 5 MG tablet TAKE ONE TABLET BY MOUTH AT BEDTIME AS NEEDED FOR SLEEP (Patient taking differently: Take 5 mg by mouth at bedtime. ) 14 tablet 0   No current facility-administered medications for this visit.   Facility-Administered Medications Ordered in Other Visits  Medication Dose Route Frequency Provider Last Rate Last Admin  . epoetin alfa-epbx (RETACRIT) injection 40,000 Units  40,000 Units Subcutaneous Once Nicholas Lose, MD        PHYSICAL EXAMINATION: ECOG PERFORMANCE STATUS: 1 - Symptomatic but completely ambulatory  Vitals:   08/03/19 1015  BP: (!) 106/43  Pulse: 70  Resp: 16  Temp: 98.2 F (36.8 C)  SpO2: 100%  Filed Weights   08/03/19 1015  Weight: 145 lb 3.2 oz (65.9 kg)    LABORATORY DATA:  I have reviewed the data as listed CMP Latest Ref Rng & Units 07/02/2019 06/24/2019 06/02/2019  Glucose 70 - 99 mg/dL 232(H) 209(H) 159(H)  BUN 8 - 23 mg/dL - 31(H) 22  Creatinine 0.44 - 1.00 mg/dL - 1.95(H) 1.44(H)  Sodium 135 - 145 mmol/L - 140 144  Potassium 3.5 - 5.1 mmol/L - 3.7 3.3(L)  Chloride 98 - 111 mmol/L - 104 104  CO2 22 - 32 mmol/L - 28 28  Calcium 8.9 - 10.3 mg/dL - 9.0 8.8  Total Protein 6.5 - 8.1 g/dL - 6.5 -  Total Bilirubin 0.3 - 1.2 mg/dL - 0.8 -  Alkaline Phos 38 - 126 U/L - 74 -  AST 15 - 41 U/L - 61(H) -  ALT 0 - 44 U/L - 39 -    Lab Results  Component Value Date   WBC 5.5 08/03/2019   HGB 9.4 (L) 08/03/2019   HCT 32.6 (L) 08/03/2019   MCV 100.6 (H) 08/03/2019   PLT 121 (L) 08/03/2019   NEUTROABS 4.1  08/03/2019    ASSESSMENT & PLAN:  Breast cancer of upper-inner quadrant of right female breast ([place]) Patient has had longstanding anemia where the hemoglobin was between 10 to 12 g. Since April 2020 her hemoglobin has gotten worse and today it is at 6.6. During the same timeframe her creatinine got from 6.4-3.32 R51 folic acid and iron studies were normal. 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  Current treatment:Retacritinjectionsweeklystarted7/31/2020 Bone marrow biopsy 04/08/2019: Normocellular bone marrow with trilineage hematopoiesis, absent iron stores, flow cytometry no abnormal B or T-cell clonal abnormalities noted.   IV iron therapy:Venofer weekly x4 04/24/19-05/15/19  CT chest: Moderate pleural effusion, right breast changes Ultrasound-guided thoracentesis 05/15/2019: 600 cc of yellow fluid removed  Hardening of breast tissue: Biopsy Fat necrosis  Continue weekly Retacrit for Hb < 10 ----------------------------------------------------------- Severe shortness of breath: Pleurex catheter (effusion related to HF)  Plan:  1. Retacrit injections: Todays Hb is 9.4 Will proceed with retacrit Inj today 2. Supportive care with blood transfusions as needed   No orders of the defined types were placed in this encounter.  The patient has a good understanding of the overall plan. she agrees with it. she will call with any problems that may develop before the next visit here.  Total time spent: 15 mins including face to face time and time spent for planning, charting and coordination of care  Nicholas Lose, MD 08/03/2019  I, Cloyde Reams Dorshimer, am acting as scribe for Dr. Nicholas Lose.  I have reviewed the above documentation for accuracy and completeness, and I agree with the above.

## 2019-08-03 ENCOUNTER — Inpatient Hospital Stay (HOSPITAL_BASED_OUTPATIENT_CLINIC_OR_DEPARTMENT_OTHER): Payer: Medicare Other | Admitting: Hematology and Oncology

## 2019-08-03 ENCOUNTER — Ambulatory Visit (INDEPENDENT_AMBULATORY_CARE_PROVIDER_SITE_OTHER): Payer: Medicare Other | Admitting: Nurse Practitioner

## 2019-08-03 ENCOUNTER — Other Ambulatory Visit: Payer: Self-pay

## 2019-08-03 ENCOUNTER — Encounter: Payer: Self-pay | Admitting: Nurse Practitioner

## 2019-08-03 ENCOUNTER — Inpatient Hospital Stay: Payer: Medicare Other

## 2019-08-03 VITALS — BP 110/58 | HR 58 | Ht 65.0 in | Wt 145.4 lb

## 2019-08-03 DIAGNOSIS — C50211 Malignant neoplasm of upper-inner quadrant of right female breast: Secondary | ICD-10-CM | POA: Diagnosis not present

## 2019-08-03 DIAGNOSIS — I129 Hypertensive chronic kidney disease with stage 1 through stage 4 chronic kidney disease, or unspecified chronic kidney disease: Secondary | ICD-10-CM | POA: Diagnosis not present

## 2019-08-03 DIAGNOSIS — I2510 Atherosclerotic heart disease of native coronary artery without angina pectoris: Secondary | ICD-10-CM | POA: Diagnosis not present

## 2019-08-03 DIAGNOSIS — Z170 Estrogen receptor positive status [ER+]: Secondary | ICD-10-CM

## 2019-08-03 DIAGNOSIS — D638 Anemia in other chronic diseases classified elsewhere: Secondary | ICD-10-CM

## 2019-08-03 DIAGNOSIS — N184 Chronic kidney disease, stage 4 (severe): Secondary | ICD-10-CM

## 2019-08-03 DIAGNOSIS — I5032 Chronic diastolic (congestive) heart failure: Secondary | ICD-10-CM

## 2019-08-03 DIAGNOSIS — I480 Paroxysmal atrial fibrillation: Secondary | ICD-10-CM | POA: Diagnosis not present

## 2019-08-03 DIAGNOSIS — Z79899 Other long term (current) drug therapy: Secondary | ICD-10-CM | POA: Diagnosis not present

## 2019-08-03 LAB — CBC WITH DIFFERENTIAL (CANCER CENTER ONLY)
Abs Immature Granulocytes: 0.01 10*3/uL (ref 0.00–0.07)
Basophils Absolute: 0.1 10*3/uL (ref 0.0–0.1)
Basophils Relative: 1 %
Eosinophils Absolute: 0.2 10*3/uL (ref 0.0–0.5)
Eosinophils Relative: 4 %
HCT: 32.6 % — ABNORMAL LOW (ref 36.0–46.0)
Hemoglobin: 9.4 g/dL — ABNORMAL LOW (ref 12.0–15.0)
Immature Granulocytes: 0 %
Lymphocytes Relative: 9 %
Lymphs Abs: 0.5 10*3/uL — ABNORMAL LOW (ref 0.7–4.0)
MCH: 29 pg (ref 26.0–34.0)
MCHC: 28.8 g/dL — ABNORMAL LOW (ref 30.0–36.0)
MCV: 100.6 fL — ABNORMAL HIGH (ref 80.0–100.0)
Monocytes Absolute: 0.6 10*3/uL (ref 0.1–1.0)
Monocytes Relative: 11 %
Neutro Abs: 4.1 10*3/uL (ref 1.7–7.7)
Neutrophils Relative %: 75 %
Platelet Count: 121 10*3/uL — ABNORMAL LOW (ref 150–400)
RBC: 3.24 MIL/uL — ABNORMAL LOW (ref 3.87–5.11)
RDW: 15.2 % (ref 11.5–15.5)
WBC Count: 5.5 10*3/uL (ref 4.0–10.5)
nRBC: 0 % (ref 0.0–0.2)

## 2019-08-03 LAB — SAMPLE TO BLOOD BANK

## 2019-08-03 MED ORDER — EPOETIN ALFA-EPBX 40000 UNIT/ML IJ SOLN
INTRAMUSCULAR | Status: AC
  Filled 2019-08-03: qty 1

## 2019-08-03 MED ORDER — EPOETIN ALFA-EPBX 40000 UNIT/ML IJ SOLN
40000.0000 [IU] | Freq: Once | INTRAMUSCULAR | Status: AC
  Administered 2019-08-03: 40000 [IU] via SUBCUTANEOUS

## 2019-08-03 NOTE — Assessment & Plan Note (Signed)
Patient has had longstanding anemia where the hemoglobin was between 10 to 12 g. Since April 2020 her hemoglobin has gotten worse and today it is at 6.6. During the same timeframe her creatinine got from 7.4-2.55 K58 folic acid and iron studies were normal. 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  Current treatment:Retacritinjectionsweeklystarted7/31/2020 Bone marrow biopsy 04/08/2019: Normocellular bone marrow with trilineage hematopoiesis, absent iron stores, flow cytometry no abnormal B or T-cell clonal abnormalities noted.   IV iron therapy:Venofer weekly x4 04/24/19-05/15/19  CT chest: Moderate pleural effusion, right breast changes Ultrasound-guided thoracentesis 05/15/2019: 600 cc of yellow fluid removed  Hardening of breast tissue: Biopsy Fat necrosis  Continue weekly Retacrit for Hb < 10 ----------------------------------------------------------- Severe shortness of breath: Pleurex catheter (effusion related to HF)  Plan:  1. Retacrit injections: Todays Hb is 9.6 Will proceed with retacrit Inj today 2. Supportive care with blood transfusions as needed

## 2019-08-03 NOTE — Patient Instructions (Signed)

## 2019-08-03 NOTE — Patient Instructions (Addendum)
After Visit Summary:  We will be checking the following labs today - BMET, HPF, and TSH   Medication Instructions:    Continue with your current medicines.    If you need a refill on your cardiac medications before your next appointment, please call your pharmacy.     Testing/Procedures To Be Arranged:  N/A  Follow-Up:   See me in about 4 to 6 weeks.     At [facility], you and your health needs are our priority.  As part of our continuing mission to provide you with exceptional heart care, we have created designated Provider Care Teams.  These Care Teams include your primary Cardiologist (physician) and Advanced Practice Providers (APPs -  Physician Assistants and Nurse Practitioners) who all work together to provide you with the care you need, when you need it.  Special Instructions:  . Stay safe, stay home, wash your hands for at least 20 seconds and wear a mask when out in public.  . It was good to talk with you today.    Call the [place] office at [phone] if you have any questions, problems or concerns.

## 2019-08-04 ENCOUNTER — Telehealth: Payer: Self-pay | Admitting: Hematology and Oncology

## 2019-08-04 ENCOUNTER — Encounter (HOSPITAL_BASED_OUTPATIENT_CLINIC_OR_DEPARTMENT_OTHER): Payer: Medicare Other | Attending: Internal Medicine | Admitting: Internal Medicine

## 2019-08-04 DIAGNOSIS — J90 Pleural effusion, not elsewhere classified: Secondary | ICD-10-CM | POA: Insufficient documentation

## 2019-08-04 DIAGNOSIS — M8580 Other specified disorders of bone density and structure, unspecified site: Secondary | ICD-10-CM | POA: Diagnosis not present

## 2019-08-04 DIAGNOSIS — J45909 Unspecified asthma, uncomplicated: Secondary | ICD-10-CM | POA: Insufficient documentation

## 2019-08-04 DIAGNOSIS — Z880 Allergy status to penicillin: Secondary | ICD-10-CM | POA: Insufficient documentation

## 2019-08-04 DIAGNOSIS — E1165 Type 2 diabetes mellitus with hyperglycemia: Secondary | ICD-10-CM | POA: Insufficient documentation

## 2019-08-04 DIAGNOSIS — Z833 Family history of diabetes mellitus: Secondary | ICD-10-CM | POA: Insufficient documentation

## 2019-08-04 DIAGNOSIS — K219 Gastro-esophageal reflux disease without esophagitis: Secondary | ICD-10-CM | POA: Diagnosis not present

## 2019-08-04 DIAGNOSIS — Z952 Presence of prosthetic heart valve: Secondary | ICD-10-CM | POA: Insufficient documentation

## 2019-08-04 DIAGNOSIS — Z923 Personal history of irradiation: Secondary | ICD-10-CM | POA: Insufficient documentation

## 2019-08-04 DIAGNOSIS — I130 Hypertensive heart and chronic kidney disease with heart failure and stage 1 through stage 4 chronic kidney disease, or unspecified chronic kidney disease: Secondary | ICD-10-CM | POA: Insufficient documentation

## 2019-08-04 DIAGNOSIS — Z853 Personal history of malignant neoplasm of breast: Secondary | ICD-10-CM | POA: Insufficient documentation

## 2019-08-04 DIAGNOSIS — I5032 Chronic diastolic (congestive) heart failure: Secondary | ICD-10-CM | POA: Insufficient documentation

## 2019-08-04 DIAGNOSIS — E1122 Type 2 diabetes mellitus with diabetic chronic kidney disease: Secondary | ICD-10-CM | POA: Insufficient documentation

## 2019-08-04 DIAGNOSIS — Z9221 Personal history of antineoplastic chemotherapy: Secondary | ICD-10-CM | POA: Insufficient documentation

## 2019-08-04 DIAGNOSIS — Z881 Allergy status to other antibiotic agents status: Secondary | ICD-10-CM | POA: Insufficient documentation

## 2019-08-04 DIAGNOSIS — F419 Anxiety disorder, unspecified: Secondary | ICD-10-CM | POA: Insufficient documentation

## 2019-08-04 DIAGNOSIS — E11622 Type 2 diabetes mellitus with other skin ulcer: Secondary | ICD-10-CM | POA: Diagnosis not present

## 2019-08-04 DIAGNOSIS — I2510 Atherosclerotic heart disease of native coronary artery without angina pectoris: Secondary | ICD-10-CM | POA: Diagnosis not present

## 2019-08-04 DIAGNOSIS — E1140 Type 2 diabetes mellitus with diabetic neuropathy, unspecified: Secondary | ICD-10-CM | POA: Insufficient documentation

## 2019-08-04 DIAGNOSIS — N184 Chronic kidney disease, stage 4 (severe): Secondary | ICD-10-CM | POA: Insufficient documentation

## 2019-08-04 DIAGNOSIS — M1990 Unspecified osteoarthritis, unspecified site: Secondary | ICD-10-CM | POA: Diagnosis not present

## 2019-08-04 DIAGNOSIS — Z87442 Personal history of urinary calculi: Secondary | ICD-10-CM | POA: Diagnosis not present

## 2019-08-04 DIAGNOSIS — I480 Paroxysmal atrial fibrillation: Secondary | ICD-10-CM | POA: Insufficient documentation

## 2019-08-04 DIAGNOSIS — L89313 Pressure ulcer of right buttock, stage 3: Secondary | ICD-10-CM | POA: Insufficient documentation

## 2019-08-04 DIAGNOSIS — Z8249 Family history of ischemic heart disease and other diseases of the circulatory system: Secondary | ICD-10-CM | POA: Insufficient documentation

## 2019-08-04 DIAGNOSIS — Z951 Presence of aortocoronary bypass graft: Secondary | ICD-10-CM | POA: Insufficient documentation

## 2019-08-04 LAB — HEPATIC FUNCTION PANEL
ALT: 24 IU/L (ref 0–32)
AST: 36 IU/L (ref 0–40)
Albumin: 3.2 g/dL — ABNORMAL LOW (ref 3.7–4.7)
Alkaline Phosphatase: 74 IU/L (ref 39–117)
Bilirubin Total: 0.4 mg/dL (ref 0.0–1.2)
Bilirubin, Direct: 0.17 mg/dL (ref 0.00–0.40)
Total Protein: 5.9 g/dL — ABNORMAL LOW (ref 6.0–8.5)

## 2019-08-04 LAB — BASIC METABOLIC PANEL
BUN/Creatinine Ratio: 16 (ref 12–28)
BUN: 44 mg/dL — ABNORMAL HIGH (ref 8–27)
CO2: 27 mmol/L (ref 20–29)
Calcium: 8.7 mg/dL (ref 8.7–10.3)
Chloride: 103 mmol/L (ref 96–106)
Creatinine, Ser: 2.77 mg/dL — ABNORMAL HIGH (ref 0.57–1.00)
GFR calc Af Amer: 19 mL/min/{1.73_m2} — ABNORMAL LOW (ref >59)
GFR calc non Af Amer: 16 mL/min/{1.73_m2} — ABNORMAL LOW (ref >59)
Glucose: 233 mg/dL — ABNORMAL HIGH (ref 65–99)
Potassium: 3.9 mmol/L (ref 3.5–5.2)
Sodium: 141 mmol/L (ref 134–144)

## 2019-08-04 LAB — TSH: TSH: 3.09 u[IU]/mL (ref 0.450–4.500)

## 2019-08-04 NOTE — Progress Notes (Signed)
PROVIDENCIA, [place] ([id number]) Visit Report for 08/04/2019 Chief Complaint Document Details Patient Name: Date of Service: Hannah [name], Hannah [name] 08/04/2019 2:45 PM Medical Record [id number] Patient Account Number: [id number] Date of Birth/Sex: Treating RN: [birth year] (74 y.o. Orvan Falconer Primary Care Provider: [name] Other Clinician: Referring Provider: Treating Provider/Extender:Raeanna Soberanes, Quincy Carnes, Marcelle Smiling in Treatment: 0 Information Obtained from: Patient Chief Complaint 08/04/2019; patient is here for review of a pressure ulcer on her right buttock Electronic Signature(s) Signed: 08/04/2019 6:00:34 PM By: Linton Ham MD Entered By: Linton Ham on 08/04/2019 16:52:24 -------------------------------------------------------------------------------- Debridement Details Patient Name: Date of Service: Hannah [name]. 08/04/2019 2:45 PM Medical Record [id number] Patient Account Number: [id number] Date of Birth/Sex: Treating RN: 22-Mar-1946 (74 y.o. Orvan Falconer Primary Care Provider: [name] Other Clinician: Referring Provider: Treating Provider/Extender:Sebastain Fishbaugh, Quincy Carnes, [place] Weeks in Treatment: 0 Debridement Performed for Wound #1 Right Gluteus Assessment: Performed By: Physician Ricard Dillon., MD Debridement Type: Debridement Level of Consciousness (Pre- Awake and Alert procedure): Pre-procedure Verification/Time Out Taken: Yes - 16:40 Start Time: 16:40 Pain Control: Other : benzocaine 20% Total Area Debrided (L x W): 0.6 (cm) x 1 (cm) = 0.6 (cm) Tissue and other material Viable, Non-Viable, Slough, Subcutaneous, Skin: Dermis , Skin: Epidermis, Slough debrided: Level: Skin/Subcutaneous Tissue Debridement Description: Excisional Instrument: Curette Bleeding: Moderate Hemostasis Achieved: Silver Nitrate End Time: 16:43 Procedural Pain: 0 Post Procedural Pain: 0 Response to Treatment: Procedure was tolerated  well Level of Consciousness Awake and Alert (Post-procedure): Post Debridement Measurements of Total Wound Length: (cm) 0.6 Stage: Category/Stage III Width: (cm) 1 Depth: (cm) 0.2 Volume: (cm) 0.094 Character of Wound/Ulcer Post Improved Debridement: Post Procedure Diagnosis Same as Pre-procedure Electronic Signature(s) Signed: 08/04/2019 6:00:34 PM By: Linton Ham MD Signed: 08/04/2019 6:06:02 PM By: Carlene Coria RN Entered By: Linton Ham on 08/04/2019 16:52:03 -------------------------------------------------------------------------------- HPI Details Patient Name: Date of Service: Hannah [name]. 08/04/2019 2:45 PM Medical Record [id number] Patient Account Number: [id number] Date of Birth/Sex: Treating RN: 12-08-1945 (74 y.o. Orvan Falconer Primary Care Provider: [name] Other Clinician: Referring Provider: Treating Provider/Extender:Sohail Capraro, Quincy Carnes, Marcelle Smiling in Treatment: 0 History of Present Illness HPI Description: ADMISSION 08/04/2019 This is a pleasant 74 year old woman who is accompanied by her husband. She has had a lot of ongoing medical issues recently some of which are related to right breast cancer status post right lumpectomy and chemotherapy. She also had a right pleural effusion and had a wound VAC placed but is now status post pleurodesis on 06/26/2019. She also has had iron deficiency anemia I think of uncertain etiology but she has been receiving IV iron at hematology. About a month ago she noted pain on her upper right buttock. She had her husband finally have a look at this and she had a pressure ulcer. She went to see her primary doctor who told her to keep this covered and dry. Past medical history; type 2 diabetes, coronary artery disease status post CABG in 1998 with a redo in 2009 she has had a bioprosthetic prosthetic aortic valve replacement, paroxysmal atrial fibrillation, diastolic heart failure, right breast  cancer status post lumpectomy and chemotherapy, residual seroma in the right breast, right pleural effusion status post drain and pleurodesis, iron deficiency anemia, hypertension and orthostatic hypotension, asthma, nephrolithiasis Electronic Signature(s) Signed: 08/04/2019 6:00:34 PM By: Linton Ham MD Entered By: Linton Ham on 08/04/2019 16:54:32 -------------------------------------------------------------------------------- Physical Exam Details Patient Name: Date of Service: Hannah [name] 08/04/2019 2:45 PM Medical Record [id number] Patient Account Number: [id number]  Date of Birth/Sex: Treating RN: November 12, 1945 (74 y.o. Orvan Falconer Primary Care Provider: [name] Other Clinician: Referring Provider: Treating Provider/Extender:Dachelle Molzahn, Quincy Carnes, [place] Weeks in Treatment: 0 Constitutional Sitting or standing Blood Pressure is within target range for patient.. Pulse regular and within target range for patient.Marland Kitchen Respirations regular, non-labored and within target range.. Temperature is normal and within the target range for the patient.Marland Kitchen Appears in no distress. Eyes Conjunctivae clear. No discharge.no icterus. Respiratory work of breathing is normal. Gastrointestinal (GI) And some weight loss but no masses no liver no spleen. Integumentary (Hair, Skin) No systemic skin issues are seen. Psychiatric appears at normal baseline. Notes Exam; small wound in the upper right buttock in close proximity to the gluteal cleft. It appears that she had an area on the left at 1 point but this is healed and fully epithelialized. The wound itself is small but with some depth 100% covered by fibrinous adherent debris. Using a #3 curette this was debrided hemostasis with silver nitrate. No evidence of surrounding infection Electronic Signature(s) Signed: 08/04/2019 6:00:34 PM By: Linton Ham MD Entered By: Linton Ham on 08/04/2019  16:56:05 -------------------------------------------------------------------------------- Physician Orders Details Patient Name: Date of Service: Hannah [name]. 08/04/2019 2:45 PM Medical Record [id number] Patient Account Number: [id number] Date of Birth/Sex: Treating RN: May 09, 1946 (74 y.o. Orvan Falconer Primary Care Provider: [name] Other Clinician: Referring Provider: Treating Provider/Extender:Marcas Bowsher, Quincy Carnes, Marcelle Smiling in Treatment: 0 Verbal / Phone Orders: No Diagnosis Coding Follow-up Appointments Return Appointment in 1 week. Dressing Change Frequency Change Dressing every other day. Wound Cleansing Wound #1 Right Gluteus Clean wound with Normal Saline. Primary Wound Dressing Silver Collagen - moisten with hydrogel or KY jelly Secondary Dressing Foam [place] #1 Right [place] skilled nursing for wound care. - [facility] Electronic Signature(s) Signed: 08/04/2019 6:00:34 PM By: Linton Ham MD Signed: 08/04/2019 6:06:02 PM By: Carlene Coria RN Entered By: Carlene Coria on 08/04/2019 16:45:32 -------------------------------------------------------------------------------- Problem List Details Patient Name: Date of Service: Hannah [name] 08/04/2019 2:45 PM Medical Record [id number] Patient Account Number: [id number] Date of Birth/Sex: Treating RN: [birth year] (73 y.o. Orvan Falconer Primary Care Provider: [name] Other Clinician: Referring Provider: Treating Provider/Extender:Simone Rodenbeck, Quincy Carnes, [place] Weeks in Treatment: 0 Active Problems ICD-10 Evaluated Encounter Code Description Active Date Today Diagnosis L89.313 Pressure ulcer of right buttock, stage 3 08/04/2019 No Yes C50.211 Malignant neoplasm of upper-inner quadrant of right 08/04/2019 No Yes female breast Inactive Problems Resolved Problems Electronic Signature(s) Signed: 08/04/2019 6:00:34 PM By: Linton Ham MD Entered By: Linton Ham on 08/04/2019 16:51:47 -------------------------------------------------------------------------------- Progress Note Details Patient Name: Date of Service: Hannah [name] 08/04/2019 2:45 PM Medical Record [id number] Patient Account Number: [id number] Date of Birth/Sex: Treating RN: 1945-10-23 (74 y.o. Orvan Falconer Primary Care Provider: [name] Other Clinician: Referring Provider: Treating Provider/Extender:Aissata Wilmore, Quincy Carnes, Marcelle Smiling in Treatment: 0 Subjective Chief Complaint Information obtained from Patient 08/04/2019; patient is here for review of a pressure ulcer on her right buttock History of Present Illness (HPI) ADMISSION 08/04/2019 This is a pleasant 74 year old woman who is accompanied by her husband. She has had a lot of ongoing medical issues recently some of which are related to right breast cancer status post right lumpectomy and chemotherapy. She also had a right pleural effusion and had a wound VAC placed but is now status post pleurodesis on 06/26/2019. She also has had iron deficiency anemia I think of uncertain etiology but she has been receiving  IV iron at hematology. About a month ago she noted pain on her upper right buttock. She had her husband finally have a look at this and she had a pressure ulcer. She went to see her primary doctor who told her to keep this covered and dry. Past medical history; type 2 diabetes, coronary artery disease status post CABG in 1998 with a redo in 2009 she has had a bioprosthetic prosthetic aortic valve replacement, paroxysmal atrial fibrillation, diastolic heart failure, right breast cancer status post lumpectomy and chemotherapy, residual seroma in the right breast, right pleural effusion status post drain and pleurodesis, iron deficiency anemia, hypertension and orthostatic hypotension, asthma, nephrolithiasis Patient History Information obtained from  Patient. Allergies amoxicillin (Severity: Moderate, Reaction: rash), adhesive (Reaction: pulls skin off), Aldactone (Reaction: unknown), Imdur, Arimidex (Reaction: nausea), latex (Reaction: rash), tetracycline (Severity: Moderate, Reaction: rash) Family History Diabetes - Maternal Grandparents, Heart Disease - Maternal Grandparents,Father, Hypertension - Maternal Grandparents,Father, No family history of Cancer, Hereditary Spherocytosis, Kidney Disease, Lung Disease, Seizures, Stroke, Thyroid Problems, Tuberculosis. Social History Never smoker, Marital Status - Married, Alcohol Use - Never, Drug Use - No History, Caffeine Use - Rarely. Medical History Eyes Patient has history of Cataracts - bil removed Ear/Nose/Mouth/Throat Denies history of Chronic sinus problems/congestion, Middle ear problems Hematologic/Lymphatic Patient has history of Anemia Respiratory Patient has history of Asthma Cardiovascular Patient has history of Arrhythmia - atrial tachycardia, afib, Congestive Heart Failure, Coronary Artery Disease, Hypertension Endocrine Patient has history of Type II Diabetes Denies history of Type I Diabetes Genitourinary Denies history of End Stage Renal Disease Integumentary (Skin) Denies history of History of Burn Musculoskeletal Patient has history of Osteoarthritis Neurologic Patient has history of Neuropathy Oncologic Patient has history of Received Chemotherapy, Received Radiation Psychiatric Patient has history of Confinement Anxiety Denies history of Anorexia/bulimia Patient is treated with Insulin. Blood sugar is tested. Blood sugar results noted at the following times: Bedtime - <180. Hospitalization/Surgery History - CABG 5 vessel. - aortic valve replacement. - portacath placement. - right breast lumpectomy. - carpal tunnel repair bil. - tonsillectomy. - tubal ligation. - c section. - cholecystectomy. - thoracentesis. Medical And Surgical History  Notes Ear/Nose/Mouth/Throat deaf in right ear Respiratory pleural effusions, allergic rhinitis Cardiovascular nonischemic cardiomyopathy Gastrointestinal GERD, hx gall stones Endocrine adrenal insufficiency Genitourinary CKD stage 4, kidney stone Musculoskeletal osteopenia Oncologic right breast CA Review of Systems (ROS) Constitutional Symptoms (General Health) Denies complaints or symptoms of Fatigue, Fever, Chills, Marked Weight Change. Eyes Complains or has symptoms of Glasses / Contacts. Denies complaints or symptoms of Dry Eyes, Vision Changes. Respiratory Complains or has symptoms of Shortness of Breath. Denies complaints or symptoms of Chronic or frequent coughs. Integumentary (Skin) Complains or has symptoms of Wounds - right gluteus. Musculoskeletal Denies complaints or symptoms of Muscle Pain, Muscle Weakness. Neurologic Complains or has symptoms of Numbness/parasthesias. Psychiatric Complains or has symptoms of Claustrophobia. Denies complaints or symptoms of Suicidal. Objective Constitutional Sitting or standing Blood Pressure is within target range for patient.. Pulse regular and within target range for patient.Marland Kitchen Respirations regular, non-labored and within target range.. Temperature is normal and within the target range for the patient.Marland Kitchen Appears in no distress. Vitals Time Taken: 3:44 PM, Height: 64 in, Source: Stated, Weight: 145 lbs, Source: Stated, BMI: 24.9, Temperature: 98.2 F, Pulse: 49 bpm, Respiratory Rate: 18 breaths/min, Blood Pressure: 143/54 mmHg, Capillary Blood Glucose: 116 mg/dl. Eyes Conjunctivae clear. No discharge.no icterus. Respiratory work of breathing is normal. Gastrointestinal (GI) And some weight loss but no masses no  liver no spleen. Psychiatric appears at normal baseline. General Notes: Exam; small wound in the upper right buttock in close proximity to the gluteal cleft. It appears that she had an area on the left at 1  point but this is healed and fully epithelialized. The wound itself is small but with some depth 100% covered by fibrinous adherent debris. Using a #3 curette this was debrided hemostasis with silver nitrate. No evidence of surrounding infection Integumentary (Hair, Skin) No systemic skin issues are seen. Wound #1 status is Open. Original cause of wound was Pressure Injury. The wound is located on the Right Gluteus. The wound measures 0.6cm length x 1cm width x 0.2cm depth; 0.471cm^2 area and 0.094cm^3 volume. There is Fat Layer (Subcutaneous Tissue) Exposed exposed. There is no tunneling or undermining noted. There is a small amount of serous drainage noted. The wound margin is flat and intact. There is small (1-33%) pink granulation within the wound bed. There is a large (67-100%) amount of necrotic tissue within the wound bed including Adherent Slough. Assessment Active Problems ICD-10 Pressure ulcer of right buttock, stage 3 Malignant neoplasm of upper-inner quadrant of right female breast Procedures Wound #1 Pre-procedure diagnosis of Wound #1 is a Pressure Ulcer located on the Right Gluteus . There was a Excisional Skin/Subcutaneous Tissue Debridement with a total area of 0.6 sq cm performed by Ricard Dillon., MD. With the following instrument(s): Curette to remove Viable and Non-Viable tissue/material. Material removed includes Subcutaneous Tissue, Slough, Skin: Dermis, and Skin: Epidermis after achieving pain control using Other (benzocaine 20%). No specimens were taken. A time out was conducted at 16:40, prior to the start of the procedure. A Moderate amount of bleeding was controlled with Silver Nitrate. The procedure was tolerated well with a pain level of 0 throughout and a pain level of 0 following the procedure. Post Debridement Measurements: 0.6cm length x 1cm width x 0.2cm depth; 0.094cm^3 volume. Post debridement Stage noted as Category/Stage III. Character of  Wound/Ulcer Post Debridement is improved. Post procedure Diagnosis Wound #1: Same as Pre-Procedure Plan Follow-up Appointments: Return Appointment in 1 week. Dressing Change Frequency: Change Dressing every other day. Wound Cleansing: Wound #1 Right Gluteus: Clean wound with Normal Saline. Primary Wound Dressing: Silver Collagen - moisten with hydrogel or KY jelly Secondary Dressing: Foam [place]: Wound #1 Right Gluteus: Continue Home Health skilled nursing for wound care. - [facility] 1. Aggressive debridement today. I think I was able to get down to healthy tissue 2. Moistened silver collagen border foam change every 2 D 3. At this point were not really sure whether the patient has home health or not. It was advanced but they were dealing with the pleural effusion drained. We may need to order her product through her insurance if she does not have home health 4. We spent a significant amount of time talking about offloading this area. I told her to be careful about this when she is sitting or when in bed. 5. No evidence of infection no cultures were done I spent 30 minutes in the review of this patient's record, face-to-face evaluation and preparation of this record Electronic Signature(s) Signed: 08/04/2019 6:00:34 PM By: Linton Ham MD Entered By: Linton Ham on 08/04/2019 16:57:46 -------------------------------------------------------------------------------- HxROS Details Patient Name: Date of Service: Hannah [name]. 08/04/2019 2:45 PM Medical Record [id number] Patient Account Number: [id number] Date of Birth/Sex: Treating RN: Jun 13, 1945 (74 y.o. Elam Dutch Primary Care Provider: [name] Other Clinician: Referring Provider: Treating Provider/Extender:Brigett Estell,  Quincy Carnes, [place] Weeks in Treatment: 0 Information Obtained From Patient Constitutional Symptoms (General Health) Complaints and Symptoms: Negative for:  Fatigue; Fever; Chills; Marked Weight Change Eyes Complaints and Symptoms: Positive for: Glasses / Contacts Negative for: Dry Eyes; Vision Changes Medical History: Positive for: Cataracts - bil removed Respiratory Complaints and Symptoms: Positive for: Shortness of Breath Negative for: Chronic or frequent coughs Medical History: Positive for: Asthma Past Medical History Notes: pleural effusions, allergic rhinitis Integumentary (Skin) Complaints and Symptoms: Positive for: Wounds - right gluteus Medical History: Negative for: History of Burn Musculoskeletal Complaints and Symptoms: Negative for: Muscle Pain; Muscle Weakness Medical History: Positive for: Osteoarthritis Past Medical History Notes: osteopenia Neurologic Complaints and Symptoms: Positive for: Numbness/parasthesias Medical History: Positive for: Neuropathy Psychiatric Complaints and Symptoms: Positive for: Claustrophobia Negative for: Suicidal Medical History: Positive for: Confinement Anxiety Negative for: Anorexia/bulimia Ear/Nose/Mouth/Throat Medical History: Negative for: Chronic sinus problems/congestion; Middle ear problems Past Medical History Notes: deaf in right ear Hematologic/Lymphatic Medical History: Positive for: Anemia Cardiovascular Medical History: Positive for: Arrhythmia - atrial tachycardia, afib; Congestive Heart Failure; Coronary Artery Disease; Hypertension Past Medical History Notes: nonischemic cardiomyopathy Gastrointestinal Medical History: Past Medical History Notes: GERD, hx gall stones Endocrine Medical History: Positive for: Type II Diabetes Negative for: Type I Diabetes Past Medical History Notes: adrenal insufficiency Time with diabetes: >20 yrs Treated with: Insulin Blood sugar tested every day: Yes Tested : 3 times per day Blood sugar testing results: Bedtime: <180 Genitourinary Medical History: Negative for: End Stage Renal Disease Past Medical  History Notes: CKD stage 4, kidney stone Immunological Oncologic Medical History: Positive for: Received Chemotherapy; Received Radiation Past Medical History Notes: right breast CA HBO Extended History Items Eyes: Cataracts Immunizations Pneumococcal Vaccine: Received Pneumococcal Vaccination: Yes Implantable Devices Yes Hospitalization / Surgery History Type of Hospitalization/Surgery CABG 5 vessel aortic valve replacement portacath placement right breast lumpectomy carpal tunnel repair bil tonsillectomy tubal ligation c section cholecystectomy thoracentesis Family and Social History Cancer: No; Diabetes: Yes - Maternal Grandparents; Heart Disease: Yes - Maternal Grandparents,Father; Hereditary Spherocytosis: No; Hypertension: Yes - Maternal Grandparents,Father; Kidney Disease: No; Lung Disease: No; Seizures: No; Stroke: No; Thyroid Problems: No; Tuberculosis: No; Never smoker; Marital Status - Married; Alcohol Use: Never; Drug Use: No History; Caffeine Use: Rarely; Financial Concerns: No; Food, Clothing or Shelter Needs: No; Support System Lacking: No; Transportation Concerns: No Engineer, maintenance) Signed: 08/04/2019 6:00:34 PM By: Linton Ham MD Signed: 08/04/2019 6:22:34 PM By: Baruch Gouty RN, BSN Entered By: Baruch Gouty on 08/04/2019 16:04:52 -------------------------------------------------------------------------------- SuperBill Details Patient Name: Date of Service: Hannah [name] 08/04/2019 Medical Record [id number] Patient Account Number: [id number] Date of Birth/Sex: Treating RN: [birth year] (74 y.o. Orvan Falconer Primary Care Provider: [name] Other Clinician: Referring Provider: Treating Provider/Extender:Emaree Chiu, Quincy Carnes, [place] Weeks in Treatment: 0 Diagnosis Coding ICD-10 Codes Code Description L89.313 Pressure ulcer of right buttock, stage 3 C50.211 Malignant neoplasm of upper-inner quadrant of right female  breast Facility Procedures CPT4 Code: [id number] Description: 99213 - WOUND CARE VISIT-LEV 3 EST PT Modifier: 25 Quantity: 1 CPT4 Code: [id number] Description: 11042 - DEB SUBQ TISSUE 20 SQ CM/< ICD-10 Diagnosis Description L89.313 Pressure ulcer of right buttock, stage 3 Modifier: Quantity: 1 Physician Procedures CPT4 Code: [id number] Description: WC PHYS LEVEL 3 NEW PT ICD-10 Diagnosis Description L89.313 Pressure ulcer of right buttock, stage 3 Modifier: 25 Quantity: 1 CPT4 Code: [id number] Description: 11042 - WC PHYS SUBQ TISS 20 SQ CM ICD-10 Diagnosis Description L89.313 Pressure ulcer of right buttock, stage 3 Modifier: Quantity:  1 Electronic Signature(s) Signed: 08/04/2019 6:00:34 PM By: Linton Ham MD Signed: 08/04/2019 6:06:02 PM By: Carlene Coria RN Entered By: Carlene Coria on 08/04/2019 17:54:20

## 2019-08-04 NOTE — Progress Notes (Addendum)
Hannah [name], Hannah [name] ([id number]) Visit Report for 08/04/2019 Allergy List Details Patient Name: Date of Service: Hannah [name], GOMM. 08/04/2019 2:45 PM Medical Record Number: [id number] Patient Account Number: [id number] Date of Birth/Sex: Treating RN: [birth year] (74 y.o. Elam Dutch Primary Care Marik Sedore: Patria Mane Other Clinician: Referring Din Bookwalter: Treating Brittain Smithey/Extender: Pecolia Ades, A USTIN Weeks in Treatment: 0 Allergies Active Allergies amoxicillin Reaction: rash Severity: Moderate adhesive Reaction: pulls skin off Aldactone Reaction: unknown Imdur Arimidex Reaction: nausea latex Reaction: rash tetracycline Reaction: rash Severity: Moderate Allergy Notes Electronic Signature(s) Signed: 08/04/2019 6:22:34 PM By: Baruch Gouty RN, BSN Entered By: Baruch Gouty on 08/04/2019 15:51:35 -------------------------------------------------------------------------------- Arrival Information Details Patient Name: Date of Service: Hannah Duff M. 08/04/2019 2:45 PM Medical Record Number: [id number] Patient Account Number: [id number] Date of Birth/Sex: Treating RN: 1946-02-18 (74 y.o. Elam Dutch Primary Care Alazay Leicht: Patria Mane Other Clinician: Referring Felita Bump: Treating Sylvanna Burggraf/Extender: Pecolia Ades, A Iona Coach in Treatment: 0 Visit Information Patient Arrived: Ambulatory Arrival Time: 15:39 Accompanied By: spouse Transfer Assistance: None Patient Identification Verified: Yes Secondary Verification Process Completed: Yes Patient Requires Transmission-Based Precautions: No Patient Has Alerts: No Electronic Signature(s) Signed: 08/04/2019 6:22:34 PM By: Baruch Gouty RN, BSN Entered By: Baruch Gouty on 08/04/2019 15:44:25 -------------------------------------------------------------------------------- Clinic Level of Care Assessment Details Patient Name: Date of Service: Hannah [name], Hannah [name] 08/04/2019 2:45  PM Medical Record Number: [id number] Patient Account Number: [id number] Date of Birth/Sex: Treating RN: 1946-03-27 (74 y.o. Orvan Falconer Primary Care Cordarius Benning: Patria Mane Other Clinician: Referring Alf Doyle: Treating Addaline Peplinski/Extender: Pecolia Ades, A Iona Coach in Treatment: 0 Clinic Level of Care Assessment Items TOOL 1 Quantity Score X- 1 0 Use when EandM and Procedure is performed on INITIAL visit ASSESSMENTS - Nursing Assessment / Reassessment X- 1 20 General Physical Exam (combine w/ comprehensive assessment (listed just below) when performed on new pt. evals) X- 1 25 Comprehensive Assessment (HX, ROS, Risk Assessments, Wounds Hx, etc.) ASSESSMENTS - Wound and Skin Assessment / Reassessment []  - 0 Dermatologic / Skin Assessment (not related to wound area) ASSESSMENTS - Ostomy and/or Continence Assessment and Care []  - 0 Incontinence Assessment and Management []  - 0 Ostomy Care Assessment and Management (repouching, etc.) PROCESS - Coordination of Care X - Simple Patient / Family Education for ongoing care 1 15 []  - 0 Complex (extensive) Patient / Family Education for ongoing care X- 1 10 Staff obtains Programmer, systems, Records, T Results / Process Orders est []  - 0 Staff telephones HHA, Nursing Homes / Clarify orders / etc []  - 0 Routine Transfer to another Facility (non-emergent condition) []  - 0 Routine Hospital Admission (non-emergent condition) X- 1 15 New Admissions / Biomedical engineer / Ordering NPWT Apligraf, etc. , []  - 0 Emergency Hospital Admission (emergent condition) PROCESS - Special Needs []  - 0 Pediatric / Minor Patient Management []  - 0 Isolation Patient Management []  - 0 Hearing / Language / Visual special needs []  - 0 Assessment of Community assistance (transportation, D/C planning, etc.) []  - 0 Additional assistance / Altered mentation []  - 0 Support Surface(s) Assessment (bed, cushion, seat, etc.) INTERVENTIONS -  Miscellaneous []  - 0 External ear exam []  - 0 Patient Transfer (multiple staff / Civil Service fast streamer / Similar devices) []  - 0 Simple Staple / Suture removal (25 or less) []  - 0 Complex Staple / Suture removal (26 or more) []  - 0 Hypo/Hyperglycemic Management (do not check if billed separately) X- 1 15 Ankle /  Brachial Index (ABI) - do not check if billed separately Has the patient been seen at the hospital within the last three years: Yes Total Score: 100 Level Of Care: New/Established - Level 3 Electronic Signature(s) Signed: 08/04/2019 6:06:02 PM By: Carlene Coria RN Entered By: Carlene Coria on 08/04/2019 16:46:38 -------------------------------------------------------------------------------- Encounter Discharge Information Details Patient Name: Date of Service: Hannah Duff M. 08/04/2019 2:45 PM Medical Record Number: [id number] Patient Account Number: [id number] Date of Birth/Sex: Treating RN: 02-Sep-1945 (74 y.o. Clearnce Sorrel Primary Care Lytle Malburg: Patria Mane Other Clinician: Referring Jakeline Dave: Treating Charie Pinkus/Extender: Pecolia Ades, A Iona Coach in Treatment: 0 Encounter Discharge Information Items Post Procedure Vitals Discharge Condition: Stable Temperature (F): 98.2 Ambulatory Status: Ambulatory Pulse (bpm): 49 Discharge Destination: Home Respiratory Rate (breaths/min): 18 Transportation: Private Auto Blood Pressure (mmHg): 143/54 Accompanied By: husband Schedule Follow-up Appointment: Yes Clinical Summary of Care: Patient Declined Electronic Signature(s) Signed: 08/04/2019 6:05:13 PM By: Kela Millin Entered By: Kela Millin on 08/04/2019 16:58:42 -------------------------------------------------------------------------------- Lower Extremity Assessment Details Patient Name: Date of Service: Hannah [name], Hannah [name] 08/04/2019 2:45 PM Medical Record Number: [id number] Patient Account Number: [id number] Date of Birth/Sex: Treating  RN: 27-Sep-1945 (73 y.o. Elam Dutch Primary Care Eastyn Dattilo: Patria Mane Other Clinician: Referring Swan Fairfax: Treating Burney Calzadilla/Extender: Pecolia Ades, A Iona Coach in Treatment: 0 Electronic Signature(s) Signed: 08/04/2019 6:22:34 PM By: Baruch Gouty RN, BSN Entered By: Baruch Gouty on 08/04/2019 16:08:06 -------------------------------------------------------------------------------- Multi Wound Chart Details Patient Name: Date of Service: Hannah Duff M. 08/04/2019 2:45 PM Medical Record Number: [id number] Patient Account Number: [id number] Date of Birth/Sex: Treating RN: 1946/04/24 (74 y.o. Orvan Falconer Primary Care Ladarrell Cornwall: Patria Mane Other Clinician: Referring Sunya Humbarger: Treating Nyal Schachter/Extender: Pecolia Ades, A Iona Coach in Treatment: 0 Vital Signs Height(in): 64 Capillary Blood Glucose(mg/dl): 116 Weight(lbs): 145 Pulse(bpm): 9 Body Mass Index(BMI): 25 Blood Pressure(mmHg): 143/54 Temperature(F): 98.2 Respiratory Rate(breaths/min): 18 Photos: [1:No Photos Right Gluteus] [N/A:N/A N/A] Wound Location: [1:Pressure Injury] [N/A:N/A] Wounding Event: [1:Pressure Ulcer] [N/A:N/A] Primary Etiology: [1:Cataracts, Anemia, Asthma,] [N/A:N/A] Comorbid History: [1:Arrhythmia, Congestive Heart Failure, Coronary Artery Disease, Hypertension, Type II Diabetes, Osteoarthritis, Neuropathy, Received Chemotherapy, Received Radiation, Confinement Anxiety 07/04/2019] [N/A:N/A] Date Acquired: [1:0] [N/A:N/A] Weeks of Treatment: [1:Open] [N/A:N/A] Wound Status: [1:0.6x1x0.2] [N/A:N/A] Measurements L x W x D (cm) [1:0.471] [N/A:N/A] A (cm) : rea [1:0.094] [N/A:N/A] Volume (cm) : [1:Category/Stage III] [N/A:N/A] Classification: [1:Small] [N/A:N/A] Exudate A mount: [1:Serous] [N/A:N/A] Exudate Type: [1:amber] [N/A:N/A] Exudate Color: [1:Flat and Intact] [N/A:N/A] Wound Margin: [1:Small (1-33%)] [N/A:N/A] Granulation A mount: [1:Pink]  [N/A:N/A] Granulation Quality: [1:Large (67-100%)] [N/A:N/A] Necrotic A mount: [1:Fat Layer (Subcutaneous Tissue)] [N/A:N/A] Exposed Structures: [1:Exposed: Yes Fascia: No Tendon: No Muscle: No Joint: No Bone: No Small (1-33%)] [N/A:N/A] Epithelialization: [1:Debridement - Excisional] [N/A:N/A] Debridement: Pre-procedure Verification/Time Out 16:40 [N/A:N/A] Taken: [1:Other] [N/A:N/A] Pain Control: [1:Subcutaneous, Slough] [N/A:N/A] Tissue Debrided: [1:Skin/Subcutaneous Tissue] [N/A:N/A] Level: [1:0.6] [N/A:N/A] Debridement A (sq cm): [1:rea Curette] [N/A:N/A] Instrument: [1:Moderate] [N/A:N/A] Bleeding: [1:Silver Nitrate] [N/A:N/A] Hemostasis A chieved: [1:0] [N/A:N/A] Procedural Pain: [1:0] [N/A:N/A] Post Procedural Pain: [1:Procedure was tolerated well] [N/A:N/A] Debridement Treatment Response: [1:0.6x1x0.2] [N/A:N/A] Post Debridement Measurements L x W x D (cm) [1:0.094] [N/A:N/A] Post Debridement Volume: (cm) [1:Category/Stage III] [N/A:N/A] Post Debridement Stage: [1:Debridement] [N/A:N/A] Treatment Notes Electronic Signature(s) Signed: 08/04/2019 6:00:34 PM By: Linton Ham MD Signed: 08/04/2019 6:06:02 PM By: Carlene Coria RN Entered By: Linton Ham on 08/04/2019 16:51:53 -------------------------------------------------------------------------------- Multi-Disciplinary Care Plan Details Patient Name: Date of Service: Hannah [name], [place]. 08/04/2019  2:45 PM Medical Record Number: [id number] Patient Account Number: [id number] Date of Birth/Sex: Treating RN: [birth year] (74 y.o. Orvan Falconer Primary Care Sharday Michl: Patria Mane Other Clinician: Referring Torey Regan: Treating Kember Boch/Extender: Pecolia Ades, A Iona Coach in Treatment: 0 Active Inactive Wound/Skin Impairment Nursing Diagnoses: Knowledge deficit related to ulceration/compromised skin integrity Goals: Ulcer/skin breakdown will have a volume reduction of 30% by week 4 Date Initiated:  08/04/2019 Target Resolution Date: 09/04/2019 Goal Status: Active Ulcer/skin breakdown will have a volume reduction of 50% by week 8 Date Initiated: 08/04/2019 Target Resolution Date: 09/04/2019 Goal Status: Active Interventions: Assess patient/caregiver ability to obtain necessary supplies Assess patient/caregiver ability to perform ulcer/skin care regimen upon admission and as needed Assess ulceration(s) every visit Notes: Electronic Signature(s) Signed: 08/04/2019 6:06:02 PM By: Carlene Coria RN Entered By: Carlene Coria on 08/04/2019 16:39:54 -------------------------------------------------------------------------------- Pain Assessment Details Patient Name: Date of Service: Hannah [name], Hannah [name] 08/04/2019 2:45 PM Medical Record Number: [id number] Patient Account Number: [id number] Date of Birth/Sex: Treating RN: April 06, 1946 (74 y.o. Elam Dutch Primary Care Ayodele Sangalang: Patria Mane Other Clinician: Referring Haset Oaxaca: Treating Channah Godeaux/Extender: Pecolia Ades, A Iona Coach in Treatment: 0 Active Problems Location of Pain Severity and Description of Pain Patient Has Paino Yes Site Locations Pain Location: Pain Location: Pain in Ulcers With Dressing Change: Yes Duration of the Pain. Constant / Intermittento Intermittent Rate the pain. Current Pain Level: 3 Worst Pain Level: 3 Least Pain Level: 0 Character of Pain Describe the Pain: Tender, Other: sore Pain Management and Medication Current Pain Management: Medication: Yes Other: reposition Is the Current Pain Management Adequate: Adequate How does your wound impact your activities of daily livingo Sleep: Yes Bathing: No Appetite: No Relationship With Others: No Bladder Continence: No Emotions: No Bowel Continence: No Work: No Toileting: No Drive: No Dressing: No Hobbies: No Electronic Signature(s) Signed: 08/04/2019 6:22:34 PM By: Baruch Gouty RN, BSN Entered By: Baruch Gouty on  08/04/2019 16:20:35 -------------------------------------------------------------------------------- Patient/Caregiver Education Details Patient Name: Date of Service: Hannah [name] 2/23/2021andnbsp2:45 PM Medical Record Number: [id number] Patient Account Number: [id number] Date of Birth/Gender: Treating RN: 08/19/1945 (74 y.o. Orvan Falconer Primary Care Physician: Patria Mane Other Clinician: Referring Physician: Treating Physician/Extender: Pecolia Ades, Ivar Bury in Treatment: 0 Education Assessment Education Provided To: Patient Education Topics Provided Wound/Skin Impairment: Methods: Explain/Verbal Responses: State content correctly Electronic Signature(s) Signed: 08/04/2019 6:06:02 PM By: Carlene Coria RN Entered By: Carlene Coria on 08/04/2019 16:41:19 -------------------------------------------------------------------------------- Wound Assessment Details Patient Name: Date of Service: Hannah [name], Hannah [name] 08/04/2019 2:45 PM Medical Record Number: [id number] Patient Account Number: [id number] Date of Birth/Sex: Treating RN: 1945/07/08 (74 y.o. Orvan Falconer Primary Care Korie Streat: Patria Mane Other Clinician: Referring Nijae Doyel: Treating Birgit Nowling/Extender: Pecolia Ades, A Iona Coach in Treatment: 0 Wound Status Wound Number: 1 Primary Pressure Ulcer Etiology: Wound Location: Right Gluteus Wound Open Wounding Event: Pressure Injury Status: Date Acquired: 07/04/2019 Comorbid Cataracts, Anemia, Asthma, Arrhythmia, Congestive Heart Failure, Weeks Of Treatment: 0 History: Coronary Artery Disease, Hypertension, Type II Diabetes, Clustered Wound: No Osteoarthritis, Neuropathy, Received Chemotherapy, Received Radiation, Confinement Anxiety Photos Wound Measurements Length: (cm) 0.6 Width: (cm) 1 Depth: (cm) 0.2 Area: (cm) 0.471 Volume: (cm) 0.094 % Reduction in Area: 0% % Reduction in Volume: 0% Epithelialization: Small  (1-33%) Tunneling: No Undermining: No Wound Description Classification: Category/Stage III Wound Margin: Flat and Intact Exudate Amount: Small Exudate Type: Serous Exudate Color: amber Foul Odor After Cleansing:  No Slough/Fibrino Yes Wound Bed Granulation Amount: Small (1-33%) Exposed Structure Granulation Quality: Pink Fascia Exposed: No Necrotic Amount: Large (67-100%) Fat Layer (Subcutaneous Tissue) Exposed: Yes Necrotic Quality: Adherent Slough Tendon Exposed: No Muscle Exposed: No Joint Exposed: No Bone Exposed: No Electronic Signature(s) Signed: 08/05/2019 5:09:01 PM By: Mikeal Hawthorne EMT/HBOT Signed: 12/08/2019 5:26:37 PM By: Carlene Coria RN Previous Signature: 08/04/2019 6:22:34 PM Version By: Baruch Gouty RN, BSN Entered By: Mikeal Hawthorne on 08/05/2019 14:56:25 -------------------------------------------------------------------------------- Vitals Details Patient Name: Date of Service: Hannah Duff M. 08/04/2019 2:45 PM Medical Record Number: [id number] Patient Account Number: [id number] Date of Birth/Sex: Treating RN: March 10, 1946 (74 y.o. Elam Dutch Primary Care Paylin Hailu: Patria Mane Other Clinician: Referring Athelene Hursey: Treating Audia Amick/Extender: Pecolia Ades, A Iona Coach in Treatment: 0 Vital Signs Time Taken: 15:44 Temperature (F): 98.2 Height (in): 64 Pulse (bpm): 49 Source: Stated Respiratory Rate (breaths/min): 18 Weight (lbs): 145 Blood Pressure (mmHg): 143/54 Source: Stated Capillary Blood Glucose (mg/dl): 116 Body Mass Index (BMI): 24.9 Reference Range: 80 - 120 mg / dl Electronic Signature(s) Signed: 08/04/2019 6:22:34 PM By: Baruch Gouty RN, BSN Entered By: Baruch Gouty on 08/04/2019 15:49:08

## 2019-08-04 NOTE — Telephone Encounter (Signed)
I talk with patient regarding schedule  

## 2019-08-04 NOTE — Progress Notes (Signed)
Hannah [name], Hannah [name] ([id number]) Visit Report for 08/04/2019 Abuse/Suicide Risk Screen Details Patient Name: Date of Service: Hannah [name], Hannah [name] 08/04/2019 2:45 PM Medical Record [phone] Patient Account Number: [id number] Date of Birth/Sex: Treating RN: [birth year] (74 y.o. Elam Dutch Primary Care Harland Aguiniga: [name] Other Clinician: Referring Melany Wiesman: Treating Thula Stewart/Extender:Robson, Quincy Carnes, [place] Weeks in Treatment: 0 Abuse/Suicide Risk Screen Items Answer ABUSE RISK SCREEN: Has anyone close to you tried to hurt or harm you recentlyo No Do you feel uncomfortable with anyone in your familyo No Has anyone forced you do things that you didnt want to doo No Electronic Signature(s) Signed: 08/04/2019 6:22:34 PM By: Baruch Gouty RN, BSN Entered By: Baruch Gouty on 08/04/2019 16:05:02 -------------------------------------------------------------------------------- Activities of Daily Living Details Patient Name: Date of Service: Hannah [name], Hannah [name] 08/04/2019 2:45 PM Medical Record [id number] Patient Account Number: [id number] Date of Birth/Sex: Treating RN: 04-25-1946 (74 y.o. Elam Dutch Primary Care Lisel Siegrist: [name] Other Clinician: Referring Valetta Mulroy: Treating Soledad Budreau/Extender:Robson, Quincy Carnes, [place] Weeks in Treatment: 0 Activities of Daily Living Items Answer Activities of Daily Living (Please select one for each item) Drive Automobile Not Able Take Medications Completely Able Use Telephone Completely Able Care for Appearance Completely Able Use Toilet Completely Able Bath / Shower Completely Able Dress Self Completely Able Feed Self Completely Able Walk Completely Able Get In / Out Bed Completely Able Housework Completely Able Prepare Meals Completely [place] Completely Able Shop for Self Completely Able Electronic Signature(s) Signed: 08/04/2019 6:22:34 PM By: Baruch Gouty RN, BSN Entered By:  Baruch Gouty on 08/04/2019 65:99:35 -------------------------------------------------------------------------------- Education Screening Details Patient Name: Date of Service: Hannah [name] 08/04/2019 2:45 PM Medical Record [phone] Patient Account Number: [id number] Date of Birth/Sex: Treating RN: 1945-08-12 (74 y.o. Elam Dutch Primary Care Shonteria Abeln: [name] Other Clinician: Referring Natasha Paulson: Treating Darryll Raju/Extender:Robson, Quincy Carnes, Marcelle Smiling in Treatment: 0 Primary Learner Assessed: Patient Learning Preferences/Education Level/Primary Language Learning Preference: Explanation, Demonstration, Printed Material Highest Education Level: College or Above Preferred Language: English Cognitive Barrier Language Barrier: No Translator Needed: No Memory Deficit: No Emotional Barrier: No Cultural/Religious Beliefs Affecting Medical Care: No Physical Barrier Impaired Vision: Yes Glasses Impaired Hearing: No Decreased Hand dexterity: No Knowledge/Comprehension Knowledge Level: High Comprehension Level: High Ability to understand written High instructions: Ability to understand verbal High instructions: Motivation Anxiety Level: Calm Cooperation: Cooperative Education Importance: Acknowledges Need Interest in Health Problems: Asks Questions Perception: Coherent Willingness to Engage in Self- High Management Activities: Readiness to Engage in Self- High Management Activities: Electronic Signature(s) Signed: 08/04/2019 6:22:34 PM By: Baruch Gouty RN, BSN Entered By: Baruch Gouty on 08/04/2019 16:06:49 -------------------------------------------------------------------------------- Fall Risk Assessment Details Patient Name: Date of Service: Hannah [name] 08/04/2019 2:45 PM Medical Record [id number] Patient Account Number: [id number] Date of Birth/Sex: Treating RN: November 21, 1945 (74 y.o. Elam Dutch Primary Care  Dunya Meiners: [name] Other Clinician: Referring Kyion Gautier: Treating Woodroe Vogan/Extender:Robson, Quincy Carnes, [place] Weeks in Treatment: 0 Fall Risk Assessment Items Have you had 2 or more falls in the last 12 monthso 0 Yes Have you had any fall that resulted in injury in the last 12 monthso 0 Yes FALLS RISK SCREEN History of falling - immediate or within 3 months 25 Yes Secondary diagnosis (Do you have 2 or more medical diagnoseso) 0 No Ambulatory aid None/bed rest/wheelchair/nurse 0 Yes Crutches/cane/walker 0 No Furniture 0 No Intravenous therapy Access/Saline/Heparin Lock 0 No Weak (short steps with or without shuffle, stooped but able to lift head 0 No while walking, may seek support from furniture)  Impaired (short steps with shuffle, may have difficulty arising from chair, 0 No head down, impaired balance) Mental Status Oriented to own ability 0 Yes Overestimates or forgets limitations 0 No Risk Level: Medium Risk Score: 25 Electronic Signature(s) Signed: 08/04/2019 6:22:34 PM By: Baruch Gouty RN, BSN Entered By: Baruch Gouty on 08/04/2019 16:07:17 -------------------------------------------------------------------------------- Foot Assessment Details Patient Name: Date of Service: Hannah [name] 08/04/2019 2:45 PM Medical Record [id number] Patient Account Number: [id number] Date of Birth/Sex: Treating RN: 03-Nov-1945 (74 y.o. Elam Dutch Primary Care Gus Littler: [name] Other Clinician: Referring Ayden Hardwick: Treating Cliff Damiani/Extender:Robson, Quincy Carnes, [place] Weeks in Treatment: 0 Foot Assessment Items Site Locations + = Sensation present, - = Sensation absent, C = Callus, U = Ulcer R = Redness, W = Warmth, M = Maceration, PU = Pre-ulcerative lesion F = Fissure, S = Swelling, D = Dryness Assessment Right: Left: Other Deformity: No No Prior Foot Ulcer: No No Prior Amputation: No No Charcot Joint: No No Ambulatory Status: Ambulatory  Without Help Gait: Steady Electronic Signature(s) Signed: 08/04/2019 6:22:34 PM By: Baruch Gouty RN, BSN Entered By: Baruch Gouty on 08/04/2019 16:08:01 -------------------------------------------------------------------------------- Nutrition Risk Screening Details Patient Name: Date of Service: Hannah [name], Hannah [name] 08/04/2019 2:45 PM Medical Record [id number] Patient Account Number: [id number] Date of Birth/Sex: Treating RN: 1946-01-01 (74 y.o. Elam Dutch Primary Care Dharma Pare: [name] Other Clinician: Referring Thu Baggett: Treating Johntay Doolen/Extender:Robson, Quincy Carnes, [place] Weeks in Treatment: 0 Height (in): 64 Weight (lbs): 145 Body Mass Index (BMI): 24.9 Nutrition Risk Screening Items Score Screening NUTRITION RISK SCREEN: I have an illness or condition that made me change the kind and/or 0 No amount of food I eat I eat fewer than two meals per day 0 No I eat few fruits and vegetables, or milk products 0 No I have three or more drinks of beer, liquor or wine almost every day 0 No I have tooth or mouth problems that make it hard for me to eat 0 No I don't always have enough money to buy the food I need 0 No I eat alone most of the time 0 No I take three or more different prescribed or over-the-counter drugs a day 1 Yes 2 Yes Without wanting to, I have lost or gained 10 pounds in the last six months I am not always physically able to shop, cook and/or feed myself 0 No Nutrition Protocols Good Risk Protocol Provide education on Moderate Risk Protocol 0 nutrition High Risk Proctocol Risk Level: Moderate Risk Score: 3 Electronic Signature(s) Signed: 08/04/2019 6:22:34 PM By: Baruch Gouty RN, BSN Entered By: Baruch Gouty on 08/04/2019 16:07:53

## 2019-08-05 ENCOUNTER — Other Ambulatory Visit: Payer: Self-pay | Admitting: Urology

## 2019-08-05 ENCOUNTER — Telehealth: Payer: Self-pay | Admitting: Internal Medicine

## 2019-08-05 NOTE — Telephone Encounter (Signed)
[name], thanks for addressing procedures, she is going to have ureteroscopy.  I assume it will be ok to hold ASA for 5 days as well?  Thanks Mickel Baas

## 2019-08-05 NOTE — Telephone Encounter (Signed)
   Primary Cardiologist: Truitt Merle, NP  Chart reviewed as part of pre-operative protocol coverage. Given past medical history and time since last visit, based on ACC/AHA guidelines, SCARLETTROSE COSTILOW would be at acceptable risk for the planned procedure without further cardiovascular testing.   See Note from 08/03/19 already faxed and ok to hold ASA for 5 days prior to procedure and eliquis 2 days.  Resuming post op   I will route this recommendation to the requesting party via Epic fax function and remove from pre-op pool.  Please call with questions.  Cecilie Kicks, NP 08/05/2019, 2:11 PM

## 2019-08-05 NOTE — Telephone Encounter (Signed)
Yes ok to hold the aspirin.   Hannah [name]

## 2019-08-05 NOTE — Telephone Encounter (Signed)
   [facility] Pre-operative Risk Assessment    Request for surgical clearance:  1. What type of surgery is being performed? Ureteroscopy  2. When is this surgery scheduled? TBD  3. What type of clearance is required (medical clearance vs. Pharmacy clearance to hold med vs. Both)? Both  4. Are there any medications that need to be held prior to surgery and how long? Eliquis, 48 hours prior / Aspirin, 5 days prior   5. Practice name and name of physician performing surgery? [facility] - Dr. Nicolette Bang  6. What is your office phone number? [phone] (ext: [phone])   7.   What is your office fax number? [phone]  8.   Anesthesia type (None, local, MAC, general) ? General    Zara Council 08/05/2019, 1:21 PM  _________________________________________________________________   (provider comments below)

## 2019-08-05 NOTE — Telephone Encounter (Signed)
Ok to hold aspirin for 5 days.   Thanks, [name]

## 2019-08-10 ENCOUNTER — Other Ambulatory Visit: Payer: Self-pay | Admitting: *Deleted

## 2019-08-10 DIAGNOSIS — N184 Chronic kidney disease, stage 4 (severe): Secondary | ICD-10-CM

## 2019-08-11 ENCOUNTER — Encounter (HOSPITAL_BASED_OUTPATIENT_CLINIC_OR_DEPARTMENT_OTHER): Payer: Medicare Other | Attending: Internal Medicine | Admitting: Internal Medicine

## 2019-08-11 ENCOUNTER — Other Ambulatory Visit: Payer: Self-pay

## 2019-08-11 DIAGNOSIS — I2510 Atherosclerotic heart disease of native coronary artery without angina pectoris: Secondary | ICD-10-CM | POA: Insufficient documentation

## 2019-08-11 DIAGNOSIS — Z853 Personal history of malignant neoplasm of breast: Secondary | ICD-10-CM | POA: Insufficient documentation

## 2019-08-11 DIAGNOSIS — Z951 Presence of aortocoronary bypass graft: Secondary | ICD-10-CM | POA: Diagnosis not present

## 2019-08-11 DIAGNOSIS — D509 Iron deficiency anemia, unspecified: Secondary | ICD-10-CM | POA: Insufficient documentation

## 2019-08-11 DIAGNOSIS — L89313 Pressure ulcer of right buttock, stage 3: Secondary | ICD-10-CM | POA: Diagnosis not present

## 2019-08-11 DIAGNOSIS — J45909 Unspecified asthma, uncomplicated: Secondary | ICD-10-CM | POA: Insufficient documentation

## 2019-08-11 DIAGNOSIS — I480 Paroxysmal atrial fibrillation: Secondary | ICD-10-CM | POA: Insufficient documentation

## 2019-08-11 DIAGNOSIS — I5032 Chronic diastolic (congestive) heart failure: Secondary | ICD-10-CM | POA: Diagnosis not present

## 2019-08-11 DIAGNOSIS — I110 Hypertensive heart disease with heart failure: Secondary | ICD-10-CM | POA: Insufficient documentation

## 2019-08-11 DIAGNOSIS — Z952 Presence of prosthetic heart valve: Secondary | ICD-10-CM | POA: Diagnosis not present

## 2019-08-11 DIAGNOSIS — E1140 Type 2 diabetes mellitus with diabetic neuropathy, unspecified: Secondary | ICD-10-CM | POA: Insufficient documentation

## 2019-08-12 NOTE — Patient Instructions (Addendum)
DUE TO COVID-19 ONLY ONE VISITOR IS ALLOWED TO COME WITH YOU AND STAY IN THE WAITING ROOM ONLY DURING PRE OP AND PROCEDURE DAY OF SURGERY. THE 1 VISITOR MAY VISIT WITH YOU AFTER SURGERY IN YOUR PRIVATE ROOM DURING VISITING HOURS ONLY!  YOU NEED TO HAVE A COVID 19 TEST ON_3/9______ @__2 :30_____, THIS TEST MUST BE DONE BEFORE SURGERY, COME  [address], [place] , [id number].  ([place]) ONCE YOUR COVID TEST IS COMPLETED, PLEASE BEGIN THE QUARANTINE INSTRUCTIONS AS OUTLINED IN YOUR HANDOUT. It is okay to go to your appointment  After.                Evella Kasal Lichtenberg    Your procedure is scheduled on: 08/21/19   Report to [facility] Main  Entrance   Report to admitting at  10:30AM     Call this number if you have problems the morning of surgery [phone]    Remember: Do not eat food after Midnight..You may have clear liquids until 6:30 am   BRUSH YOUR TEETH MORNING OF SURGERY AND RINSE YOUR MOUTH OUT, NO CHEWING GUM CANDY OR MINTS.     Take these medicines the morning of surgery with A SIP OF WATER: Amiodarone, Metoprolol, Exemestane, Pepcid, Midodrine              Use your inhalers and bring them with you to the hospital  DO NOT Irion    How to Manage Your Diabetes Before and After Surgery  Why is it important to control my blood sugar before and after surgery? . Improving blood sugar levels before and after surgery helps healing and can limit problems. . A way of improving blood sugar control is eating a healthy diet by: o  Eating less sugar and carbohydrates o  Increasing activity/exercise o  Talking with your doctor about reaching your blood sugar goals . High blood sugars (greater than 180 mg/dL) can raise your risk of infections and slow your recovery, so you will need to focus on controlling your diabetes during the weeks before surgery. . Make sure that the doctor who takes care of your diabetes  knows about your planned surgery including the date and location.  How do I manage my blood sugar before surgery? . Check your blood sugar at least 4 times a day, starting 2 days before surgery, to make sure that the level is not too high or low. o Check your blood sugar the morning of your surgery when you wake up and every 2 hours until you get to the Short Stay unit. . If your blood sugar is less than 70 mg/dL, you will need to treat for low blood sugar: o Do not take insulin. o Treat a low blood sugar (less than 70 mg/dL) with  cup of clear juice (cranberry or apple), 4 glucose tablets, OR glucose gel. o Recheck blood sugar in 15 minutes after treatment (to make sure it is greater than 70 mg/dL). If your blood sugar is not greater than 70 mg/dL on recheck, call [phone] for further instructions. . Report your blood sugar to the short stay nurse when you get to Short Stay.  . If you are admitted to the hospital after surgery: o Your blood sugar will be checked by the staff and you will probably be given insulin after surgery (instead of oral diabetes medicines) to make sure you have good blood sugar levels. o The goal  for blood sugar control after surgery is 80-180 mg/dL.   WHAT DO I DO ABOUT MY DIABETES MEDICATION?  Marland Kitchen Do not take oral diabetes medicines (pills) the morning of surgery. .  . THE DAY BEFORE SURGERY,  AM dose of Lantis is 18 units  . THE NIGHT BEFORE SURGERY, take  0     units of Lantis      insulin.       . THE MORNING OF SURGERY, take   8  units of  Lantis       Insulin.( 50 % of regular dose)  . The day of surgery, do not take other diabetes injectables, including Byetta (exenatide), Bydureon (exenatide ER), Victoza (liraglutide), or Trulicity (dulaglutide).  . If your CBG is greater than 220 mg/dL, you may take  of your sliding scale (Lispro) . (correction) dose of insulin.                You may not have any metal on your body including hair pins and               piercings  Do not wear jewelry, make-up, lotions, powders or perfumes, deodorant             Do not wear nail polish on your fingernails.  Do not shave  48 hours prior to surgery.     Do not bring valuables to the hospital. [place].  Contacts, dentures or bridgework may not be worn into surgery.      Patients discharged the day of surgery will not be allowed to drive home  . IF YOU ARE HAVING SURGERY AND GOING HOME THE SAME DAY, YOU MUST HAVE AN ADULT TO DRIVE YOU HOME AND BE WITH YOU FOR 24 HOURS.  YOU MAY GO HOME BY TAXI OR UBER OR ORTHERWISE, BUT AN ADULT MUST ACCOMPANY YOU HOME AND STAY WITH YOU FOR 24 HOURS.  Name and phone number of your driver:  Special Instructions: N/A              Please read over the following fact sheets you were given: _____________________________________________________________________             [facility] - Preparing for Surgery  Before surgery, you can play an important role.   Because skin is not sterile, your skin needs to be as free of germs as possible.   You can reduce the number of germs on your skin by washing with CHG (chlorahexidine gluconate) soap before surgery.   CHG is an antiseptic cleaner which kills germs and bonds with the skin to continue killing germs even after washing. Please DO NOT use if you have an allergy to CHG or antibacterial soaps.   If your skin becomes reddened/irritated stop using the CHG and inform your nurse when you arrive at Short Stay. Do not shave (including legs and underarms) for at least 48 hours prior to the first CHG shower.    Please follow these instructions carefully:  1.  Shower with CHG Soap the night before surgery and the  morning of Surgery.  2.  If you choose to wash your hair, wash your hair first as usual with your  normal  shampoo.  3.  After you shampoo, rinse your hair and body thoroughly to remove the  shampoo.  4.  Use CHG as you would any other liquid soap.  You can apply chg directly  to the skin and wash                       Gently with a scrungie or clean washcloth.  5.  Apply the CHG Soap to your body ONLY FROM THE NECK DOWN.   Do not use on face/ open                           Wound or open sores. Avoid contact with eyes, ears mouth and genitals (private parts).                       Wash face,  Genitals (private parts) with your normal soap.             6.  Wash thoroughly, paying special attention to the area where your surgery  will be performed.  7.  Thoroughly rinse your body with warm water from the neck down.  8.  DO NOT shower/wash with your normal soap after using and rinsing off  the CHG Soap.             9.  Pat yourself dry with a clean towel.            10.  Wear clean pajamas.            11.  Place clean sheets on your bed the night of your first shower and do not  sleep with pets. Day of Surgery : Do not apply any lotions/deodorants the morning of surgery.  Please wear clean clothes to the hospital/surgery center.  FAILURE TO FOLLOW THESE INSTRUCTIONS MAY RESULT IN THE CANCELLATION OF YOUR SURGERY PATIENT SIGNATURE_________________________________  NURSE SIGNATURE__________________________________  ________________________________________________________________________

## 2019-08-12 NOTE — Progress Notes (Signed)
Hannah, [name] ([id number]) Visit Report for 08/11/2019 Debridement Details Patient Name: Date of Service: Hannah [name], Hannah [name] 08/11/2019 2:15 PM Medical Record [id number] Patient Account Number: [id number] Date of Birth/Sex: Treating RN: August 14, 1945 (74 y.o. Orvan Falconer Primary Care Provider: [name] Other Clinician: Referring Provider: Treating Provider/Extender:Earnstine Meinders, Quincy Carnes, [place] Weeks in Treatment: 1 Debridement Performed for Wound #1 Right Gluteus Assessment: Performed By: Physician Ricard Dillon., MD Debridement Type: Debridement Level of Consciousness (Pre- Awake and Alert procedure): Pre-procedure Yes - 15:04 Verification/Time Out Taken: Start Time: 15:04 Pain Control: Other : benzocaine 20% Total Area Debrided (L x W): 0.5 (cm) x 0.8 (cm) = 0.4 (cm) Tissue and other material Viable, Non-Viable, Slough, Subcutaneous, Skin: Dermis , Skin: Epidermis, Slough debrided: Level: Skin/Subcutaneous Tissue Debridement Description: Excisional Instrument: Curette Bleeding: Minimum Hemostasis Achieved: Pressure End Time: 15:05 Procedural Pain: 2 Post Procedural Pain: 0 Response to Treatment: Procedure was tolerated well Level of Consciousness Awake and Alert (Post-procedure): Post Debridement Measurements of Total Wound Length: (cm) 0.5 Stage: Category/Stage III Width: (cm) 0.8 Depth: (cm) 0.7 Volume: (cm) 0.22 Character of Wound/Ulcer Post Improved Debridement: Post Procedure Diagnosis Same as Pre-procedure Electronic Signature(s) Signed: 08/11/2019 5:14:23 PM By: Linton Ham MD Signed: 08/12/2019 5:16:33 PM By: Carlene Coria RN Entered By: Linton Ham on 08/11/2019 15:12:42 -------------------------------------------------------------------------------- HPI Details Patient Name: Date of Service: Hannah [name]. 08/11/2019 2:15 PM Medical Record [id number] Patient Account Number: [id number] Date of Birth/Sex: Treating  RN: 1946-02-08 (74 y.o. Orvan Falconer Primary Care Provider: [name] Other Clinician: Referring Provider: Treating Provider/Extender:Delmi Fulfer, Quincy Carnes, Marcelle Smiling in Treatment: 1 History of Present Illness HPI Description: ADMISSION 08/04/2019 This is a pleasant 74 year old woman who is accompanied by her husband. She has had a lot of ongoing medical issues recently some of which are related to right breast cancer status post right lumpectomy and chemotherapy. She also had a right pleural effusion and had a wound VAC placed but is now status post pleurodesis on 06/26/2019. She also has had iron deficiency anemia I think of uncertain etiology but she has been receiving IV iron at hematology. About a month ago she noted pain on her upper right buttock. She had her husband finally have a look at this and she had a pressure ulcer. She went to see her primary doctor who told her to keep this covered and dry. Past medical history; type 2 diabetes, coronary artery disease status post CABG in 1998 with a redo in 2009 she has had a bioprosthetic prosthetic aortic valve replacement, paroxysmal atrial fibrillation, diastolic heart failure, right breast cancer status post lumpectomy and chemotherapy, residual seroma in the right breast, right pleural effusion status post drain and pleurodesis, iron deficiency anemia, hypertension and orthostatic hypotension, asthma, nephrolithiasis 3/2; the patient readmitted to the clinic last week. She has a small but punched out wound in the upper right buttock and close proximity to the gluteal cleft. We use silver collagen. She has home health and she has a daughter-in-law who is an LPN is changing this as well every second day. Electronic Signature(s) Signed: 08/11/2019 5:14:23 PM By: Linton Ham MD Entered By: Linton Ham on 08/11/2019 15:13:21 -------------------------------------------------------------------------------- Physical Exam  Details Patient Name: Date of Service: Hannah [name] 08/11/2019 2:15 PM Medical Record [id number] Patient Account Number: [id number] Date of Birth/Sex: Treating RN: 31-Dec-1945 (74 y.o. Orvan Falconer Primary Care Provider: [name] Other Clinician: Referring Provider: Treating Provider/Extender:Aidian Salomon, Quincy Carnes, [place] Weeks in Treatment: 1 Constitutional Patient is hypertensive.. Pulse regular and  within target range for patient.Marland Kitchen Respirations regular, non-labored and within target range.. Temperature is normal and within the target range for the patient.Marland Kitchen Appears in no distress. Notes Wound exam; small wound in the upper right buttock. Again nonviable surface. Debrided with a #3 curette. This has above 0.8 cm of depth. There is no palpable bone. This does not look infected. There is no surrounding erythema and no purulent drainage Electronic Signature(s) Signed: 08/11/2019 5:14:23 PM By: Linton Ham MD Entered By: Linton Ham on 08/11/2019 15:16:21 -------------------------------------------------------------------------------- Physician Orders Details Patient Name: Date of Service: Hannah [name]. 08/11/2019 2:15 PM Medical Record [id number] Patient Account Number: [id number] Date of Birth/Sex: Treating RN: November 20, 1945 (74 y.o. Orvan Falconer Primary Care Provider: [name] Other Clinician: Referring Provider: Treating Provider/Extender:Innocence Schlotzhauer, Quincy Carnes, Marcelle Smiling in Treatment: 1 Verbal / Phone Orders: No Diagnosis Coding ICD-10 Coding Code Description L89.313 Pressure ulcer of right buttock, stage 3 C50.211 Malignant neoplasm of upper-inner quadrant of right female breast Follow-up Appointments Return Appointment in 1 week. Dressing Change Frequency Change Dressing every other day. Wound Cleansing Wound #1 Right Gluteus Clean wound with Normal Saline. Primary Wound Dressing Silver Collagen - moisten with  hydrogel Secondary Dressing Foam [place] #1 Right [place] skilled nursing for wound care. - [facility] Electronic Signature(s) Signed: 08/11/2019 5:14:23 PM By: Linton Ham MD Signed: 08/12/2019 5:16:33 PM By: Carlene Coria RN Entered By: Carlene Coria on 08/11/2019 15:07:29 -------------------------------------------------------------------------------- Problem List Details Patient Name: Date of Service: Hannah [name]. 08/11/2019 2:15 PM Medical Record [id number] Patient Account Number: [id number] Date of Birth/Sex: Treating RN: Aug 30, 1945 (74 y.o. Orvan Falconer Primary Care Provider: [name] Other Clinician: Referring Provider: Treating Provider/Extender:Maralyn Witherell, Quincy Carnes, Marcelle Smiling in Treatment: 1 Active Problems ICD-10 Evaluated Encounter Code Description Active Date Today Diagnosis L89.313 Pressure ulcer of right buttock, stage 3 08/04/2019 No Yes C50.211 Malignant neoplasm of upper-inner quadrant of right 08/04/2019 No Yes female breast Inactive Problems Resolved Problems Electronic Signature(s) Signed: 08/11/2019 5:14:23 PM By: Linton Ham MD Entered By: Linton Ham on 08/11/2019 15:12:13 -------------------------------------------------------------------------------- Progress Note Details Patient Name: Date of Service: Hannah [name]. 08/11/2019 2:15 PM Medical Record [id number] Patient Account Number: [id number] Date of Birth/Sex: Treating RN: 1946-02-04 (74 y.o. Orvan Falconer Primary Care Provider: [name] Other Clinician: Referring Provider: Treating Provider/Extender:Johncarlos Holtsclaw, Quincy Carnes, [place] Weeks in Treatment: 1 Subjective History of Present Illness (HPI) ADMISSION 08/04/2019 This is a pleasant 74 year old woman who is accompanied by her husband. She has had a lot of ongoing medical issues recently some of which are related to right breast cancer status post right  lumpectomy and chemotherapy. She also had a right pleural effusion and had a wound VAC placed but is now status post pleurodesis on 06/26/2019. She also has had iron deficiency anemia I think of uncertain etiology but she has been receiving IV iron at hematology. About a month ago she noted pain on her upper right buttock. She had her husband finally have a look at this and she had a pressure ulcer. She went to see her primary doctor who told her to keep this covered and dry. Past medical history; type 2 diabetes, coronary artery disease status post CABG in 1998 with a redo in 2009 she has had a bioprosthetic prosthetic aortic valve replacement, paroxysmal atrial fibrillation, diastolic heart failure, right breast cancer status post lumpectomy and chemotherapy, residual seroma in the right breast, right pleural effusion status post drain and pleurodesis, iron deficiency anemia,  hypertension and orthostatic hypotension, asthma, nephrolithiasis 3/2; the patient readmitted to the clinic last week. She has a small but punched out wound in the upper right buttock and close proximity to the gluteal cleft. We use silver collagen. She has home health and she has a daughter-in-law who is an LPN is changing this as well every second day. Objective Constitutional Patient is hypertensive.. Pulse regular and within target range for patient.Marland Kitchen Respirations regular, non-labored and within target range.. Temperature is normal and within the target range for the patient.Marland Kitchen Appears in no distress. Vitals Time Taken: 2:27 PM, Height: 64 in, Weight: 145 lbs, BMI: 24.9, Temperature: 98.3 F, Pulse: 63 bpm, Respiratory Rate: 18 breaths/min, Blood Pressure: 159/52 mmHg, Capillary Blood Glucose: 120 mg/dl. General Notes: Wound exam; small wound in the upper right buttock. Again nonviable surface. Debrided with a #3 curette. This has above 0.8 cm of depth. There is no palpable bone. This does not look infected. There is  no surrounding erythema and no purulent drainage Integumentary (Hair, Skin) Wound #1 status is Open. Original cause of wound was Pressure Injury. The wound is located on the Right Gluteus. The wound measures 0.5cm length x 0.8cm width x 0.7cm depth; 0.314cm^2 area and 0.22cm^3 volume. There is Fat Layer (Subcutaneous Tissue) Exposed exposed. There is no tunneling noted, however, there is undermining starting at 3:00 and ending at 6:00 with a maximum distance of 0.5cm. There is a small amount of serous drainage noted. The wound margin is flat and intact. There is small (1-33%) pink, pale granulation within the wound bed. There is a large (67-100%) amount of necrotic tissue within the wound bed including Adherent Slough. Assessment Active Problems ICD-10 Pressure ulcer of right buttock, stage 3 Malignant neoplasm of upper-inner quadrant of right female breast Procedures Wound #1 Pre-procedure diagnosis of Wound #1 is a Pressure Ulcer located on the Right Gluteus . There was a Excisional Skin/Subcutaneous Tissue Debridement with a total area of 0.4 sq cm performed by Ricard Dillon., MD. With the following instrument(s): Curette to remove Viable and Non-Viable tissue/material. Material removed includes Subcutaneous Tissue, Slough, Skin: Dermis, and Skin: Epidermis after achieving pain control using Other (benzocaine 20%). No specimens were taken. A time out was conducted at 15:04, prior to the start of the procedure. A Minimum amount of bleeding was controlled with Pressure. The procedure was tolerated well with a pain level of 2 throughout and a pain level of 0 following the procedure. Post Debridement Measurements: 0.5cm length x 0.8cm width x 0.7cm depth; 0.22cm^3 volume. Post debridement Stage noted as Category/Stage III. Character of Wound/Ulcer Post Debridement is improved. Post procedure Diagnosis Wound #1: Same as Pre-Procedure Plan Follow-up Appointments: Return Appointment in 1  week. Dressing Change Frequency: Change Dressing every other day. Wound Cleansing: Wound #1 Right Gluteus: Clean wound with Normal Saline. Primary Wound Dressing: Silver Collagen - moisten with hydrogel Secondary Dressing: Foam [place]: Wound #1 Right Gluteus: Continue Home Health skilled nursing for wound care. - [facility] 1. Moistened silver collagen I have suggested using the hydrogel that they had supplied 2. I am going to give the collagen another week here. 3. The patient really seems to be making attempt to offload this. 4. Consider Santyl if this continues to be a debridement issue Electronic Signature(s) Signed: 08/11/2019 5:14:23 PM By: Linton Ham MD Entered By: Linton Ham on 08/11/2019 15:17:06 -------------------------------------------------------------------------------- SuperBill Details Patient Name: Date of Service: Hannah [name] 08/11/2019 Medical Record [id number] Patient Account Number: [id number]  Date of Birth/Sex: Treating RN: [birth year] (74 y.o. Orvan Falconer Primary Care Provider: [name] Other Clinician: Referring Provider: Treating Provider/Extender:Olander Friedl, Quincy Carnes, [place] Weeks in Treatment: 1 Diagnosis Coding ICD-10 Codes Code Description T53.202 Pressure ulcer of right buttock, stage 3 C50.211 Malignant neoplasm of upper-inner quadrant of right female breast Facility Procedures CPT4 Code Description: [id number] [facility] - DEB SUBQ TISSUE 20 SQ CM/< ICD-10 Diagnosis Description L89.313 Pressure ulcer of right buttock, stage 3 C50.211 Malignant neoplasm of upper-inner quadrant of right Modifier: female breast Quantity: 1 Physician Procedures CPT4 Code Description: [id number] - WC PHYS SUBQ TISS 20 SQ CM ICD-10 Diagnosis Description L89.313 Pressure ulcer of right buttock, stage 3 C50.211 Malignant neoplasm of upper-inner quadrant of right f Modifier: emale breast Quantity: 1 Electronic  Signature(s) Signed: 08/11/2019 5:14:23 PM By: Linton Ham MD Entered By: Linton Ham on 08/11/2019 15:17:19

## 2019-08-13 ENCOUNTER — Inpatient Hospital Stay: Payer: Medicare Other

## 2019-08-13 ENCOUNTER — Encounter (HOSPITAL_COMMUNITY): Payer: Self-pay

## 2019-08-13 ENCOUNTER — Encounter (HOSPITAL_COMMUNITY)
Admission: RE | Admit: 2019-08-13 | Discharge: 2019-08-13 | Disposition: A | Discharge status: Home / Self Care | Payer: Medicare Other | Source: Ambulatory Visit | Attending: Urology | Admitting: Urology

## 2019-08-13 ENCOUNTER — Ambulatory Visit: Payer: Medicare Other

## 2019-08-13 ENCOUNTER — Other Ambulatory Visit: Payer: Self-pay

## 2019-08-13 ENCOUNTER — Other Ambulatory Visit: Payer: Medicare Other

## 2019-08-13 ENCOUNTER — Encounter (HOSPITAL_COMMUNITY): Payer: Medicare Other

## 2019-08-13 ENCOUNTER — Inpatient Hospital Stay: Payer: Medicare Other | Attending: Hematology and Oncology

## 2019-08-13 VITALS — BP 123/36 | HR 60 | Temp 98.5°F | Resp 18

## 2019-08-13 DIAGNOSIS — Z923 Personal history of irradiation: Secondary | ICD-10-CM | POA: Diagnosis not present

## 2019-08-13 DIAGNOSIS — Z853 Personal history of malignant neoplasm of breast: Secondary | ICD-10-CM | POA: Diagnosis not present

## 2019-08-13 DIAGNOSIS — Z79811 Long term (current) use of aromatase inhibitors: Secondary | ICD-10-CM | POA: Insufficient documentation

## 2019-08-13 DIAGNOSIS — C50211 Malignant neoplasm of upper-inner quadrant of right female breast: Secondary | ICD-10-CM | POA: Insufficient documentation

## 2019-08-13 DIAGNOSIS — J45909 Unspecified asthma, uncomplicated: Secondary | ICD-10-CM | POA: Diagnosis not present

## 2019-08-13 DIAGNOSIS — Z951 Presence of aortocoronary bypass graft: Secondary | ICD-10-CM | POA: Insufficient documentation

## 2019-08-13 DIAGNOSIS — H903 Sensorineural hearing loss, bilateral: Secondary | ICD-10-CM | POA: Insufficient documentation

## 2019-08-13 DIAGNOSIS — I5032 Chronic diastolic (congestive) heart failure: Secondary | ICD-10-CM | POA: Diagnosis not present

## 2019-08-13 DIAGNOSIS — I480 Paroxysmal atrial fibrillation: Secondary | ICD-10-CM | POA: Diagnosis not present

## 2019-08-13 DIAGNOSIS — N189 Chronic kidney disease, unspecified: Secondary | ICD-10-CM | POA: Diagnosis not present

## 2019-08-13 DIAGNOSIS — Z8673 Personal history of transient ischemic attack (TIA), and cerebral infarction without residual deficits: Secondary | ICD-10-CM | POA: Diagnosis not present

## 2019-08-13 DIAGNOSIS — D631 Anemia in chronic kidney disease: Secondary | ICD-10-CM | POA: Diagnosis not present

## 2019-08-13 DIAGNOSIS — Z7901 Long term (current) use of anticoagulants: Secondary | ICD-10-CM | POA: Insufficient documentation

## 2019-08-13 DIAGNOSIS — Z01818 Encounter for other preprocedural examination: Secondary | ICD-10-CM | POA: Insufficient documentation

## 2019-08-13 DIAGNOSIS — Z79899 Other long term (current) drug therapy: Secondary | ICD-10-CM | POA: Diagnosis not present

## 2019-08-13 DIAGNOSIS — Z9221 Personal history of antineoplastic chemotherapy: Secondary | ICD-10-CM | POA: Diagnosis not present

## 2019-08-13 DIAGNOSIS — Z170 Estrogen receptor positive status [ER+]: Secondary | ICD-10-CM | POA: Insufficient documentation

## 2019-08-13 DIAGNOSIS — Z7951 Long term (current) use of inhaled steroids: Secondary | ICD-10-CM | POA: Diagnosis not present

## 2019-08-13 DIAGNOSIS — Z794 Long term (current) use of insulin: Secondary | ICD-10-CM | POA: Insufficient documentation

## 2019-08-13 DIAGNOSIS — I129 Hypertensive chronic kidney disease with stage 1 through stage 4 chronic kidney disease, or unspecified chronic kidney disease: Secondary | ICD-10-CM | POA: Insufficient documentation

## 2019-08-13 DIAGNOSIS — I252 Old myocardial infarction: Secondary | ICD-10-CM | POA: Insufficient documentation

## 2019-08-13 DIAGNOSIS — Z952 Presence of prosthetic heart valve: Secondary | ICD-10-CM | POA: Diagnosis not present

## 2019-08-13 DIAGNOSIS — N200 Calculus of kidney: Secondary | ICD-10-CM | POA: Diagnosis not present

## 2019-08-13 DIAGNOSIS — D638 Anemia in other chronic diseases classified elsewhere: Secondary | ICD-10-CM

## 2019-08-13 DIAGNOSIS — E1122 Type 2 diabetes mellitus with diabetic chronic kidney disease: Secondary | ICD-10-CM | POA: Diagnosis not present

## 2019-08-13 DIAGNOSIS — K219 Gastro-esophageal reflux disease without esophagitis: Secondary | ICD-10-CM | POA: Insufficient documentation

## 2019-08-13 DIAGNOSIS — E785 Hyperlipidemia, unspecified: Secondary | ICD-10-CM | POA: Insufficient documentation

## 2019-08-13 DIAGNOSIS — N184 Chronic kidney disease, stage 4 (severe): Secondary | ICD-10-CM | POA: Diagnosis not present

## 2019-08-13 HISTORY — DX: Hypotension, unspecified: I95.9

## 2019-08-13 LAB — CBC
HCT: 31.6 % — ABNORMAL LOW (ref 36.0–46.0)
Hemoglobin: 9.1 g/dL — ABNORMAL LOW (ref 12.0–15.0)
MCH: 29.4 pg (ref 26.0–34.0)
MCHC: 28.8 g/dL — ABNORMAL LOW (ref 30.0–36.0)
MCV: 102.3 fL — ABNORMAL HIGH (ref 80.0–100.0)
Platelets: 136 10*3/uL — ABNORMAL LOW (ref 150–400)
RBC: 3.09 MIL/uL — ABNORMAL LOW (ref 3.87–5.11)
RDW: 15 % (ref 11.5–15.5)
WBC: 6.6 10*3/uL (ref 4.0–10.5)
nRBC: 0 % (ref 0.0–0.2)

## 2019-08-13 LAB — BASIC METABOLIC PANEL
Anion gap: 7 (ref 5–15)
BUN: 44 mg/dL — ABNORMAL HIGH (ref 8–23)
CO2: 27 mmol/L (ref 22–32)
Calcium: 8.3 mg/dL — ABNORMAL LOW (ref 8.9–10.3)
Chloride: 105 mmol/L (ref 98–111)
Creatinine, Ser: 3.32 mg/dL — ABNORMAL HIGH (ref 0.44–1.00)
GFR calc Af Amer: 15 mL/min — ABNORMAL LOW (ref >60)
GFR calc non Af Amer: 13 mL/min — ABNORMAL LOW (ref >60)
Glucose, Bld: 339 mg/dL — ABNORMAL HIGH (ref 70–99)
Potassium: 4.2 mmol/L (ref 3.5–5.1)
Sodium: 139 mmol/L (ref 135–145)

## 2019-08-13 LAB — SAMPLE TO BLOOD BANK

## 2019-08-13 LAB — CBC WITH DIFFERENTIAL (CANCER CENTER ONLY)
Abs Immature Granulocytes: 0.01 10*3/uL (ref 0.00–0.07)
Basophils Absolute: 0.1 10*3/uL (ref 0.0–0.1)
Basophils Relative: 1 %
Eosinophils Absolute: 0.3 10*3/uL (ref 0.0–0.5)
Eosinophils Relative: 4 %
HCT: 31.6 % — ABNORMAL LOW (ref 36.0–46.0)
Hemoglobin: 9.4 g/dL — ABNORMAL LOW (ref 12.0–15.0)
Immature Granulocytes: 0 %
Lymphocytes Relative: 7 %
Lymphs Abs: 0.5 10*3/uL — ABNORMAL LOW (ref 0.7–4.0)
MCH: 29.7 pg (ref 26.0–34.0)
MCHC: 29.7 g/dL — ABNORMAL LOW (ref 30.0–36.0)
MCV: 99.7 fL (ref 80.0–100.0)
Monocytes Absolute: 0.7 10*3/uL (ref 0.1–1.0)
Monocytes Relative: 11 %
Neutro Abs: 4.9 10*3/uL (ref 1.7–7.7)
Neutrophils Relative %: 77 %
Platelet Count: 127 10*3/uL — ABNORMAL LOW (ref 150–400)
RBC: 3.17 MIL/uL — ABNORMAL LOW (ref 3.87–5.11)
RDW: 15 % (ref 11.5–15.5)
WBC Count: 6.4 10*3/uL (ref 4.0–10.5)
nRBC: 0 % (ref 0.0–0.2)

## 2019-08-13 LAB — HEMOGLOBIN A1C
Hgb A1c MFr Bld: 6.1 % — ABNORMAL HIGH (ref 4.8–5.6)
Mean Plasma Glucose: 128.37 mg/dL

## 2019-08-13 LAB — GLUCOSE, CAPILLARY: Glucose-Capillary: 293 mg/dL — ABNORMAL HIGH (ref 70–99)

## 2019-08-13 MED ORDER — EPOETIN ALFA-EPBX 40000 UNIT/ML IJ SOLN
INTRAMUSCULAR | Status: AC
  Filled 2019-08-13: qty 1

## 2019-08-13 MED ORDER — EPOETIN ALFA-EPBX 40000 UNIT/ML IJ SOLN
40000.0000 [IU] | Freq: Once | INTRAMUSCULAR | Status: AC
  Administered 2019-08-13: 40000 [IU] via SUBCUTANEOUS

## 2019-08-13 NOTE — Patient Instructions (Signed)

## 2019-08-13 NOTE — Progress Notes (Signed)
PCP - A. Shakle DO Cardiologist - Dr.G. Taylor  Chest x-ray - no EKG - 08/03/19 Stress Test - no ECHO - 08/05/19 Cardiac Cath - 2009  Sleep Study - Yes- negative results CPAP - no  Fasting Blood Sugar - 90-125 Checks Blood Sugar __TID___ times a day  Blood Thinner Instructions:Eliquis Aspirin Instructions:stop 48 hours before surgery instructed by Dr. Lovena Le Last Dose:08/10/19  Anesthesia review:   Patient denies shortness of breath, fever, cough and chest pain at PAT appointment yes  Patient verbalized understanding of instructions that were given to them at the PAT appointment. Patient was also instructed that they will need to review over the PAT instructions again at home before surgery. yes

## 2019-08-17 ENCOUNTER — Other Ambulatory Visit: Payer: Medicare Other

## 2019-08-17 NOTE — Progress Notes (Signed)
Anesthesia Chart Review   Case: [id number] Date/Time: 08/21/19 1215   Procedures:      CYSTOSCOPY WITH RETROGRADE PYELOGRAM, URETEROSCOPY AND STENT PLACEMENT (Left ) - 1 HR     HOLMIUM LASER APPLICATION (Left )   Anesthesia type: General   Pre-op diagnosis: LEFT RENAL PELVIC CALCULUS   Location: [place] [facility]   Surgeons: Cleon Gustin, MD      DISCUSSION:74 y.o. never smoker with h/o PONV, HLD, DM II, GERD, right breast cancer, CKD Stage IV, PAF (Eliquis), CAD s/p CABG (1998 with redo 2009- Dr. Cyndia Bent), bioprosthetic AVR in 1275, chronic diastolic CHF, asthma, carotid artery disease (1-39% by duplex 10/2017, due 2021), left renal pelvic calculus scheduled for above procedure 08/21/19 with Dr. Nicolette Bang.   Pt cleared by cardiologist 08/05/2019.  Per Cecilie Kicks, NP, "Chart reviewed as part of pre-operative protocol coverage. Given past medical history and time since last visit, based on ACC/AHA guidelines, [place] HE[place] would be at acceptable risk for the planned procedure without further cardiovascular testing.  See Note from 08/03/19 already faxed and ok to hold ASA for 5 days prior to procedure and eliquis 2 days.  Resuming post op"  Elevated glucose at PAT visit.  She reports she ate right before coming, reports checking blood sugar three times daily with average sugar 90-125.  Last A1C 6.1.  Will evaluate DOS.   S/p pleuradesis 06/26/2019, under MAC with no complications.  Follow up with cardiothoracic surgery 07/29/19.  Per OV note, "Mrs. Wyne returns today for follow-up status post drainage of a recurrent right pleural effusion and talc pleurodesis on 06/26/2019 and subsequent removal of her Pleurx catheter on 07/14/2019.  She has been feeling well overall.  She says she still has some shortness of breath with exertion related to her heart failure but is different than what she had before.  Overall she feels better."  PRN follow up if she experiences recurrent pleural  effusions.   Anticipate pt can proceed with planned procedure barring acute status change.   VS: LMP  (LMP Unknown)   PROVIDERS: [name], DO is PCP   [name], MD is Cardiologist   Gilford Raid, MD is Cardiothoracic Surgeon LABS: Evaluate glucose DOS (all labs ordered are listed, but only abnormal results are displayed)  Labs Reviewed  GLUCOSE, CAPILLARY - Abnormal; Notable for the following components:      Result Value   Glucose-Capillary 293 (*)    All other components within normal limits     IMAGES:   EKG: 08/03/19  Rate 58 bpm  Sinus bradycardia 1st degree AV block   CV: Echo 06/11/2019 IMPRESSIONS    1. Left ventricular ejection fraction, by visual estimation, is 60 to  65%. The left ventricle has normal function. Left ventricular septal wall  thickness was normal. Normal left ventricular posterior wall thickness.  There is no left ventricular  hypertrophy.  2. Elevated left ventricular end-diastolic pressure.  3. Left ventricular diastolic parameters are consistent with Grade II  diastolic dysfunction (pseudonormalization).  4. Global right ventricle has normal systolic function.The right  ventricular size is normal. No increase in right ventricular wall  thickness.  5. Left atrial size was severely dilated.  6. Right atrial size was normal.  7. Mild mitral annular calcification.  8. Mild thickening of the anterior and posterior mitral valve leaflet(s).  9. The mitral valve is normal in structure. Mild mitral valve  regurgitation. No evidence of mitral stenosis.  10. The  tricuspid valve is normal in structure.  11. Aortic valve regurgitation is not visualized. No evidence of aortic  valve sclerosis or stenosis.  12. The pulmonic valve was normal in structure. Pulmonic valve  regurgitation is trivial.  13. Normal pulmonary artery systolic pressure.  14. The inferior vena cava is normal in size with greater than 50%  respiratory  variability, suggesting right atrial pressure of 3 mmHg.  Past Medical History:  Diagnosis Date  . Abnormally small mouth   . Allergic rhinitis 10/14/2009   Qualifier: Diagnosis of  By: Lamonte Sakai MD, Rose Fillers   Overview:  Overview:  Qualifier: Diagnosis of  By: Lamonte Sakai MD, Rose Fillers  Last Assessment & Plan:  Please continue Xyzal and Nasacort as you have been using them  . Anemia   . Asthma 05/12/2009   10/12/2014 p extensive coaching HFA effectiveness =    75% s spacer    Overview:  Overview:  10/12/2014 p extensive coaching HFA effectiveness =    75% s spacer   Last Assessment & Plan:  Please continue Symbicort 2 puffs twice a day. Remember to rinse and gargle after taking this medication. Take albuterol 2 puffs up to every 4 hours if needed for shortness of breath.  Follow with Dr Lamonte Sakai in 6 month  . Bilateral carotid artery stenosis    a. mild 1-39% by duplex 10/2017, due 2021.  . Breast cancer ([place]) 01/18/2016   right breast  . CAD (coronary artery disease) 01/24/2011   a. s/p CABG 1998 with redo 2009.  Marland Kitchen Chemotherapy induced nausea and vomiting 02/24/2016  . Chemotherapy-induced peripheral neuropathy ([place]) 04/27/2016  . 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 Chronic diastolic CHF (congestive heart failure) ([place]) 09/24/2013  . CKD (chronic kidney disease), stage IV ([place]) 09/24/2013  . Closed fracture of head of left humerus 09/09/2017  . Complication of anesthesia   . Dysrhythmia    a-fib  . Gallstones   . GERD (gastroesophageal reflux disease)   . Glaucoma   . H/O atrial tachycardia 05/12/2009   Qualifier: History of  By: Lamonte Sakai MD, Rose Fillers   Overview:  Overview:  Qualifier: History of  By: Lamonte Sakai MD, Rose Fillers  Last Assessment & Plan:  There was no evidence of this on her cardiac monitor. I would recommend watchful waiting.   Marland Kitchen Heart murmur   . History of kidney stones   . History of non-ST elevation myocardial infarction (NSTEMI)    Sept 2014--  thought to be type II HTN w/ LHC without  infarct related artery and patent grafts  . History of radiation therapy 05/24/16-07/26/16   right breast 50.4 Gy in 28 fractions, right breast boost 10 Gy in 5 fractions  . Hyperlipidemia   . Hypotension   . Iron deficiency anemia   . Mania ([place]) 03/05/2016  . Moderate persistent asthma    pulmologist-  Dr. Malvin Johns  . Osteomyelitis of toe of left foot ([place]) 04/06/2016  . Osteopenia of multiple sites 10/19/2015  . PAF (paroxysmal atrial fibrillation) ([place])   . Personal history of chemotherapy 2017  . Personal history of radiation therapy 2017  . PONV (postoperative nausea and vomiting)   . Port catheter in place 02/17/2016  . Psoriasis    right leg  . Renal calculus, right   . S/P AVR    prosthesis valve placement 2009 at same time re-do CABG  . Seizure-like activity ([place]) 09/01/2017  . Sensorineural hearing loss (SNHL) of both ears 01/06/2016  . Stroke [facility]) 2014  residual rt hearing loss  . Syncope 09/06/2017  . Type 2 diabetes mellitus ([place])    monitored by dr Dwyane Dee    Past Surgical History:  Procedure Laterality Date  . AORTIC VALVE REPLACEMENT  2009   [facility]done same time is CABG)  . BREAST LUMPECTOMY Right 01/18/2016  . BREAST LUMPECTOMY WITH RADIOACTIVE SEED AND SENTINEL LYMPH NODE BIOPSY Right 01/18/2016   Procedure: RIGHT BREAST LUMPECTOMY WITH RADIOACTIVE SEED AND SENTINEL LYMPH NODE BIOPSY;  Surgeon: DAlphonsa Overall MD;  [place]  Service: General;  Laterality: Right;  . CARDIAC CATHETERIZATION  03/23/2008   Pre-redo CABG: L main OK, LAD (T), CFX (T), OM1 99%, RCA (T), LIMA-LAD OK, SVG-OM(?3) OK w/ little florw to OM2, SVG-RCA OK. EF NL  . CARPAL TUNNEL RELEASE    . CHEST TUBE INSERTION Right 06/26/2019   Procedure: INSERTION PLEURAL DRAINAGE CATHETER;  Surgeon: BGaye Pollack MD;  [place]  Service: Thoracic;  Laterality: Right;  . CHOLECYSTECTOMY N/A 07/07/2018   Procedure: LAPAROSCOPIC CHOLECYSTECTOMY WITH  INTRAOPERATIVE CHOLANGIOGRAM ERAS PATHWAY;  Surgeon: NAlphonsa Overall MD;  [facility];  Service: General;  Laterality: N/A;  . COLONOSCOPY     around 2015. Possibly with [facility]   . CORONARY ARTERY BYPASS GRAFT  1998 &  re-do 2009   Had LIMA to DX/LAD, SVG [facility] 2 marginal branches [facility]; SVG to 3rd OM and PD at time of redo  . CYSTOSCOPY W/ URETERAL STENT PLACEMENT Right 12/20/2014   Procedure: CYSTOSCOPY WITH RETROGRADE PYELOGRAM/URETERAL STENT PLACEMENT;  Surgeon: PCleon Gustin MD;  [facility]  Service: Urology;  Laterality: Right;  . ESOPHAGOGASTRODUODENOSCOPY     many years ago per patient   . ESOPHAGOGASTRODUODENOSCOPY ENDOSCOPY  06/17/2018  . EYE SURGERY Bilateral    cataracts  . HOLMIUM LASER APPLICATION Right 79/38/1829  Procedure:  HOLMIUM LASER LITHOTRIPSY;  Surgeon: PCleon Gustin MD;  [facility]  Service: Urology;  Laterality: Right;  . IR THORACENTESIS ASP PLEURAL SPACE W/IMG GUIDE  10/03/2018  . IR THORACENTESIS ASP PLEURAL SPACE W/IMG GUIDE  06/08/2019  . LEFT HEART CATHETERIZATION WITH CORONARY/GRAFT ANGIOGRAM N/A 02/23/2013   Procedure: LEFT HEART CATHETERIZATION WITH CBeatrix Fetters  Surgeon: MBlane Ohara MD;  [facility];  Service: Cardiovascular;  Laterality: N/A;  . LOOP RECORDER INSERTION N/A 08/30/2017   Procedure: LOOP RECORDER INSERTION;  Surgeon: TEvans Lance MD;  [place] [facility];  Service: Cardiovascular;  Laterality: N/A;  . PORTACATH PLACEMENT Left 01/18/2016   Procedure: INSERTION PORT-A-CATH;  Surgeon: DAlphonsa Overall MD;  [place]  Service: General;  Laterality: Left;  . portacath removal    . REMOVAL OF PLEURAL DRAINAGE CATHETER Right 07/14/2019   Procedure: REMOVAL OF PLEURAL DRAINAGE CATHETER;  Surgeon: BGaye Pollack MD;  [place]  Service: Thoracic;  Laterality: Right;  . TALC PLEURODESIS N/A 06/26/2019   Procedure: TPietro Cassis  Surgeon: BGaye Pollack MD;  [facility];  Service: Thoracic;  Laterality: N/A;  . TONSILLECTOMY    . TRANSTHORACIC ECHOCARDIOGRAM  02-24-2013      mild LVH,  ef 50-55%/  AV bioprosthesis was present with very mild stenosis and no regurg., mean grandient 156mg, peak grandient 2018m /  mild MR/  mild LAE and RAE/  moderate TR  . TUBAL LIGATION      MEDICATIONS: . acetaminophen (TYLENOL) 325 MG tablet  .  albuterol (PROVENTIL HFA;VENTOLIN HFA) 108 (90 Base) MCG/ACT inhaler  . amiodarone (PACERONE) 200 MG tablet  . apixaban (ELIQUIS) 5 MG TABS tablet  . Blood Glucose Monitoring Suppl (ACCU-CHEK GUIDE ME) w/Device KIT  . budesonide-formoterol (SYMBICORT) 160-4.5 MCG/ACT inhaler  . epoetin alfa-epbx (RETACRIT) 35940 UNIT/ML injection  . exemestane (AROMASIN) 25 MG tablet  . ezetimibe (ZETIA) 10 MG tablet  . famotidine (PEPCID) 20 MG tablet  . furosemide (LASIX) 20 MG tablet  . glucose blood (ACCU-CHEK GUIDE) test strip  . HYDROcodone-acetaminophen (NORCO/VICODIN) 5-325 MG tablet  . insulin glargine (LANTUS) 100 UNIT/ML injection  . Insulin Lispro (HUMALOG KWIKPEN [place])  . Insulin Pen Needle 32G X 4 MM MISC  . loratadine (CLARITIN) 10 MG tablet  . metoprolol tartrate (LOPRESSOR) 25 MG tablet  . midodrine (PROAMATINE) 2.5 MG tablet  . nitroGLYCERIN (NITROSTAT) 0.4 MG SL tablet  . oxyCODONE (ROXICODONE) 5 MG immediate release tablet  . potassium chloride SA (KLOR-CON) 20 MEQ tablet  . rosuvastatin (CRESTOR) 40 MG tablet  . zolpidem (AMBIEN) 5 MG tablet   No current facility-administered medications for this encounter.     Maia Plan [facility] Pre-Surgical Testing [phone] 08/17/19  3:34 PM

## 2019-08-18 ENCOUNTER — Other Ambulatory Visit: Payer: Medicare Other

## 2019-08-18 ENCOUNTER — Other Ambulatory Visit: Payer: Self-pay

## 2019-08-18 ENCOUNTER — Other Ambulatory Visit (HOSPITAL_COMMUNITY)
Admission: RE | Admit: 2019-08-18 | Discharge: 2019-08-18 | Disposition: A | Discharge status: Home / Self Care | Payer: Medicare Other | Source: Ambulatory Visit | Attending: Urology | Admitting: Urology

## 2019-08-18 ENCOUNTER — Encounter (HOSPITAL_BASED_OUTPATIENT_CLINIC_OR_DEPARTMENT_OTHER): Payer: Medicare Other | Admitting: Internal Medicine

## 2019-08-18 DIAGNOSIS — Z20822 Contact with and (suspected) exposure to covid-19: Secondary | ICD-10-CM | POA: Diagnosis not present

## 2019-08-18 DIAGNOSIS — Z01812 Encounter for preprocedural laboratory examination: Secondary | ICD-10-CM | POA: Diagnosis present

## 2019-08-18 DIAGNOSIS — N184 Chronic kidney disease, stage 4 (severe): Secondary | ICD-10-CM

## 2019-08-18 DIAGNOSIS — L89313 Pressure ulcer of right buttock, stage 3: Secondary | ICD-10-CM | POA: Diagnosis not present

## 2019-08-19 ENCOUNTER — Telehealth: Payer: Self-pay

## 2019-08-19 LAB — BASIC METABOLIC PANEL
BUN/Creatinine Ratio: 14 (ref 12–28)
BUN: 48 mg/dL — ABNORMAL HIGH (ref 8–27)
CO2: 24 mmol/L (ref 20–29)
Calcium: 8.6 mg/dL — ABNORMAL LOW (ref 8.7–10.3)
Chloride: 101 mmol/L (ref 96–106)
Creatinine, Ser: 3.43 mg/dL — ABNORMAL HIGH (ref 0.57–1.00)
GFR calc Af Amer: 15 mL/min/{1.73_m2} — ABNORMAL LOW (ref >59)
GFR calc non Af Amer: 13 mL/min/{1.73_m2} — ABNORMAL LOW (ref >59)
Glucose: 161 mg/dL — ABNORMAL HIGH (ref 65–99)
Potassium: 4.2 mmol/L (ref 3.5–5.2)
Sodium: 139 mmol/L (ref 134–144)

## 2019-08-19 LAB — SARS CORONAVIRUS 2 (TAT 6-24 HRS): SARS Coronavirus 2: NEGATIVE

## 2019-08-19 NOTE — Telephone Encounter (Signed)
lpmtcb 3/10

## 2019-08-19 NOTE — Telephone Encounter (Signed)
Hannah [name],   Let's talk about this Friday.   [name]

## 2019-08-19 NOTE — Telephone Encounter (Signed)
-----   Message from [name], NP sent at 08/19/2019  7:38 AM EST ----- Please call - she continues to have worsening kidney function - can we find out how she is doing - making urine, short of breath, etc? ? ?Lithotripsy later this week?? Needs to see Renal ASAP

## 2019-08-19 NOTE — Progress Notes (Signed)
JANEESE, [name] ([id number]) Visit Report for 08/18/2019 Debridement Details Patient Name: Date of Service: Hannah [name], Hannah [name] 08/18/2019 3:00 PM Medical Record [id number] Patient Account Number: [id number] Date of Birth/Sex: Treating RN: [birth year] (74 y.o. Orvan Falconer Primary Care Provider: [name] Other Clinician: Referring Provider: Treating Provider/Extender:Fotini Lemus, Quincy Carnes, [place] Weeks in Treatment: 2 Debridement Performed for Wound #1 Right Gluteus Assessment: Performed By: Physician Ricard Dillon., MD Debridement Type: Debridement Level of Consciousness (Pre- Awake and Alert procedure): Pre-procedure Yes - 15:51 Verification/Time Out Taken: Start Time: 15:51 Pain Control: Lidocaine 5% topical ointment Total Area Debrided (L x W): 0.5 (cm) x 0.8 (cm) = 0.4 (cm) Tissue and other material Viable, Non-Viable, Slough, Subcutaneous, Skin: Epidermis, Slough debrided: Level: Skin/Subcutaneous Tissue Debridement Description: Excisional Instrument: Curette Bleeding: Moderate Hemostasis Achieved: Pressure End Time: 15:53 Procedural Pain: 0 Post Procedural Pain: 0 Response to Treatment: Procedure was tolerated well Level of Consciousness Awake and Alert (Post-procedure): Post Debridement Measurements of Total Wound Length: (cm) 0.5 Stage: Category/Stage III Width: (cm) 0.8 Depth: (cm) 0.6 Volume: (cm) 0.188 Character of Wound/Ulcer Post Improved Debridement: Post Procedure Diagnosis Same as Pre-procedure Electronic Signature(s) Signed: 08/18/2019 5:48:31 PM By: Linton Ham MD Signed: 08/19/2019 7:20:02 AM By: Carlene Coria RN Entered By: Linton Ham on 08/18/2019 15:56:44 -------------------------------------------------------------------------------- HPI Details Patient Name: Date of Service: Hannah [name]. 08/18/2019 3:00 PM Medical Record [id number] Patient Account Number: [id number] Date of Birth/Sex: Treating  RN: December 29, 1945 (74 y.o. Orvan Falconer Primary Care Provider: [name] Other Clinician: Referring Provider: Treating Provider/Extender:Brinsley Wence, Quincy Carnes, Marcelle Smiling in Treatment: 2 History of Present Illness HPI Description: ADMISSION 08/04/2019 This is a pleasant 74 year old woman who is accompanied by her husband. She has had a lot of ongoing medical issues recently some of which are related to right breast cancer status post right lumpectomy and chemotherapy. She also had a right pleural effusion and had a wound VAC placed but is now status post pleurodesis on 06/26/2019. She also has had iron deficiency anemia I think of uncertain etiology but she has been receiving IV iron at hematology. About a month ago she noted pain on her upper right buttock. She had her husband finally have a look at this and she had a pressure ulcer. She went to see her primary doctor who told her to keep this covered and dry. Past medical history; type 2 diabetes, coronary artery disease status post CABG in 1998 with a redo in 2009 she has had a bioprosthetic prosthetic aortic valve replacement, paroxysmal atrial fibrillation, diastolic heart failure, right breast cancer status post lumpectomy and chemotherapy, residual seroma in the right breast, right pleural effusion status post drain and pleurodesis, iron deficiency anemia, hypertension and orthostatic hypotension, asthma, nephrolithiasis 3/2; the patient readmitted to the clinic last week. She has a small but punched out wound in the upper right buttock and close proximity to the gluteal cleft. We use silver collagen. She has home health and she has a daughter-in-law who is an LPN is changing this as well every second day. 3/9; the patient was admitted to the clinic 2 weeks ago. She had a small pressure ulcer on the upper right buttock. We have been using silver collagen. Unfortunately she comes back in with very adherent debris over the surface  of the wound Electronic Signature(s) Signed: 08/18/2019 5:48:31 PM By: Linton Ham MD Entered By: Linton Ham on 08/18/2019 15:58:33 -------------------------------------------------------------------------------- Physical Exam Details Patient Name: Date of Service: Hannah [name]. 08/18/2019 3:00 PM Medical Record [id number]  Patient Account Number: [id number] Date of Birth/Sex: Treating RN: 1945-09-27 (74 y.o. Orvan Falconer Primary Care Provider: [name] Other Clinician: Referring Provider: Treating Provider/Extender:Jimmy Stipes, Quincy Carnes, [place] Weeks in Treatment: 2 Constitutional Sitting or standing Blood Pressure is within target range for patient.. Pulse regular and within target range for patient.Marland Kitchen Respirations regular, non-labored and within target range.. Temperature is normal and within the target range for the patient.Marland Kitchen Appears in no distress. Notes Wound exam; small wound in the upper right buttock again a completely nonviable surface. Difficult debridement with a #3 curette removing necrotic subcutaneous tissue and debris. Because the small size of the orifice this is a very difficult debridement. This does not look infected but there is some depth Electronic Signature(s) Signed: 08/18/2019 5:48:31 PM By: Linton Ham MD Entered By: Linton Ham on 08/18/2019 15:59:28 -------------------------------------------------------------------------------- Physician Orders Details Patient Name: Date of Service: Hannah [name]. 08/18/2019 3:00 PM Medical Record [id number] Patient Account Number: [id number] Date of Birth/Sex: Treating RN: 1945/07/09 (74 y.o. Orvan Falconer Primary Care Provider: [name] Other Clinician: Referring Provider: Treating Provider/Extender:Leonia Heatherly, Quincy Carnes, Marcelle Smiling in Treatment: 2 Verbal / Phone Orders: No Diagnosis Coding ICD-10 Coding Code Description L89.313 Pressure ulcer of right buttock,  stage 3 C50.211 Malignant neoplasm of upper-inner quadrant of right female breast Follow-up Appointments Return Appointment in 1 week. Dressing Change Frequency Change Dressing every other day. Wound Cleansing Wound #1 Right Gluteus Clean wound with Normal Saline. Primary Wound Dressing Iodoflex Secondary Dressing Foam Border Electronic Signature(s) Signed: 08/18/2019 5:48:31 PM By: Linton Ham MD Signed: 08/19/2019 7:20:02 AM By: Carlene Coria RN Entered By: Carlene Coria on 08/18/2019 15:54:26 -------------------------------------------------------------------------------- Problem List Details Patient Name: Date of Service: Hannah [name]. 08/18/2019 3:00 PM Medical Record [id number] Patient Account Number: [id number] Date of Birth/Sex: Treating RN: Sep 22, 1945 (74 y.o. Orvan Falconer Primary Care Provider: [name] Other Clinician: Referring Provider: Treating Provider/Extender:Nitisha Civello, Quincy Carnes, Marcelle Smiling in Treatment: 2 Active Problems ICD-10 Evaluated Encounter Code Description Active Date Today Diagnosis L89.313 Pressure ulcer of right buttock, stage 3 08/04/2019 No Yes C50.211 Malignant neoplasm of upper-inner quadrant of right 08/04/2019 No Yes female breast Inactive Problems Resolved Problems Electronic Signature(s) Signed: 08/18/2019 5:48:31 PM By: Linton Ham MD Entered By: Linton Ham on 08/18/2019 15:56:16 -------------------------------------------------------------------------------- Progress Note Details Patient Name: Date of Service: Hannah [name]. 08/18/2019 3:00 PM Medical Record [id number] Patient Account Number: [id number] Date of Birth/Sex: Treating RN: July 31, 1945 (74 y.o. Orvan Falconer Primary Care Provider: [name] Other Clinician: Referring Provider: Treating Provider/Extender:Aiesha Leland, Quincy Carnes, [place] Weeks in Treatment: 2 Subjective History of Present Illness (HPI) ADMISSION 08/04/2019 This  is a pleasant 74 year old woman who is accompanied by her husband. She has had a lot of ongoing medical issues recently some of which are related to right breast cancer status post right lumpectomy and chemotherapy. She also had a right pleural effusion and had a wound VAC placed but is now status post pleurodesis on 06/26/2019. She also has had iron deficiency anemia I think of uncertain etiology but she has been receiving IV iron at hematology. About a month ago she noted pain on her upper right buttock. She had her husband finally have a look at this and she had a pressure ulcer. She went to see her primary doctor who told her to keep this covered and dry. Past medical history; type 2 diabetes, coronary artery disease status post CABG in 1998 with a redo in 2009 she has had a bioprosthetic prosthetic aortic valve replacement,  paroxysmal atrial fibrillation, diastolic heart failure, right breast cancer status post lumpectomy and chemotherapy, residual seroma in the right breast, right pleural effusion status post drain and pleurodesis, iron deficiency anemia, hypertension and orthostatic hypotension, asthma, nephrolithiasis 3/2; the patient readmitted to the clinic last week. She has a small but punched out wound in the upper right buttock and close proximity to the gluteal cleft. We use silver collagen. She has home health and she has a daughter-in-law who is an LPN is changing this as well every second day. 3/9; the patient was admitted to the clinic 2 weeks ago. She had a small pressure ulcer on the upper right buttock. We have been using silver collagen. Unfortunately she comes back in with very adherent debris over the surface of the wound Objective Constitutional Sitting or standing Blood Pressure is within target range for patient.. Pulse regular and within target range for patient.Marland Kitchen Respirations regular, non-labored and within target range.. Temperature is normal and within the  target range for the patient.Marland Kitchen Appears in no distress. Vitals Time Taken: 3:34 PM, Height: 64 in, Weight: 145 lbs, BMI: 24.9, Temperature: 98.5 F, Pulse: 57 bpm, Respiratory Rate: 16 breaths/min, Blood Pressure: 116/74 mmHg, Capillary Blood Glucose: 121 mg/dl. General Notes: glucose per pt report General Notes: Wound exam; small wound in the upper right buttock again a completely nonviable surface. Difficult debridement with a #3 curette removing necrotic subcutaneous tissue and debris. Because the small size of the orifice this is a very difficult debridement. This does not look infected but there is some depth Integumentary (Hair, Skin) Wound #1 status is Open. Original cause of wound was Pressure Injury. The wound is located on the Right Gluteus. The wound measures 0.5cm length x 0.8cm width x 0.6cm depth; 0.314cm^2 area and 0.188cm^3 volume. There is Fat Layer (Subcutaneous Tissue) Exposed exposed. There is no tunneling noted, however, there is undermining starting at 3:00 and ending at 6:00 with a maximum distance of 0.2cm. There is a small amount of serous drainage noted. The wound margin is flat and intact. There is small (1-33%) pink, pale granulation within the wound bed. There is a large (67-100%) amount of necrotic tissue within the wound bed including Adherent Slough. Assessment Active Problems ICD-10 Pressure ulcer of right buttock, stage 3 Malignant neoplasm of upper-inner quadrant of right female breast Procedures Wound #1 Pre-procedure diagnosis of Wound #1 is a Pressure Ulcer located on the Right Gluteus . There was a Excisional Skin/Subcutaneous Tissue Debridement with a total area of 0.4 sq cm performed by Ricard Dillon., MD. With the following instrument(s): Curette to remove Viable and Non-Viable tissue/material. Material removed includes Subcutaneous Tissue, Slough, and Skin: Epidermis after achieving pain control using Lidocaine 5% topical ointment. No specimens  were taken. A time out was conducted at 15:51, prior to the start of the procedure. A Moderate amount of bleeding was controlled with Pressure. The procedure was tolerated well with a pain level of 0 throughout and a pain level of 0 following the procedure. Post Debridement Measurements: 0.5cm length x 0.8cm width x 0.6cm depth; 0.188cm^3 volume. Post debridement Stage noted as Category/Stage III. Character of Wound/Ulcer Post Debridement is improved. Post procedure Diagnosis Wound #1: Same as Pre-Procedure Plan Follow-up Appointments: Return Appointment in 1 week. Dressing Change Frequency: Change Dressing every other day. Wound Cleansing: Wound #1 Right Gluteus: Clean wound with Normal Saline. Primary Wound Dressing: Iodoflex Secondary Dressing: Foam Border 1. I change the primary dressing to Iodoflex to try and address the adherent  debris. She is changing this every second day. Family member is an LPN doing Therapist, music) Signed: 08/18/2019 5:48:31 PM By: Linton Ham MD Entered By: Linton Ham on 08/18/2019 16:00:08 -------------------------------------------------------------------------------- SuperBill Details Patient Name: Date of Service: Hannah [name] 08/18/2019 Medical Record [id number] Patient Account Number: [id number] Date of Birth/Sex: Treating RN: 06/19/1945 (74 y.o. Orvan Falconer Primary Care Provider: [name] Other Clinician: Referring Provider: Treating Provider/Extender:Mychele Seyller, Quincy Carnes, [place] Weeks in Treatment: 2 Diagnosis Coding ICD-10 Codes Code Description M22.633 Pressure ulcer of right buttock, stage 3 C50.211 Malignant neoplasm of upper-inner quadrant of right female breast Facility Procedures CPT4 Code: [id number] 1 Description: 3893 - DEB SUBQ TISSUE 20 SQ CM/< ICD-10 Diagnosis Description L89.313 Pressure ulcer of right buttock, stage 3 Modifier: Quantity: 1 Physician Procedures CPT4 Code:  [id number] Description: 81157 - WC PHYS SUBQ TISS 20 SQ CM ICD-10 Diagnosis Description L89.313 Pressure ulcer of right buttock, stage 3 Modifier: Quantity: 1 Electronic Signature(s) Signed: 08/18/2019 5:48:31 PM By: Linton Ham MD Entered By: Linton Ham on 08/18/2019 16:00:24

## 2019-08-19 NOTE — Telephone Encounter (Signed)
I spoke to the patient about lab results.  She said that she is putting out urine and her SOB hasn't worsened at all.    She is scheduled for a Lithotripsy on Friday, but will reach out to [facility] today about lab values.

## 2019-08-20 ENCOUNTER — Other Ambulatory Visit: Payer: Medicare Other

## 2019-08-20 ENCOUNTER — Ambulatory Visit: Payer: Medicare Other

## 2019-08-20 ENCOUNTER — Other Ambulatory Visit: Payer: Self-pay

## 2019-08-20 ENCOUNTER — Inpatient Hospital Stay: Payer: Medicare Other

## 2019-08-20 VITALS — BP 122/68 | HR 62 | Temp 98.2°F | Resp 18

## 2019-08-20 DIAGNOSIS — D638 Anemia in other chronic diseases classified elsewhere: Secondary | ICD-10-CM

## 2019-08-20 DIAGNOSIS — I129 Hypertensive chronic kidney disease with stage 1 through stage 4 chronic kidney disease, or unspecified chronic kidney disease: Secondary | ICD-10-CM | POA: Diagnosis not present

## 2019-08-20 LAB — CBC WITH DIFFERENTIAL (CANCER CENTER ONLY)
Abs Immature Granulocytes: 0.02 10*3/uL (ref 0.00–0.07)
Basophils Absolute: 0.1 10*3/uL (ref 0.0–0.1)
Basophils Relative: 1 %
Eosinophils Absolute: 0.3 10*3/uL (ref 0.0–0.5)
Eosinophils Relative: 4 %
HCT: 34.6 % — ABNORMAL LOW (ref 36.0–46.0)
Hemoglobin: 10 g/dL — ABNORMAL LOW (ref 12.0–15.0)
Immature Granulocytes: 0 %
Lymphocytes Relative: 12 %
Lymphs Abs: 0.8 10*3/uL (ref 0.7–4.0)
MCH: 29.2 pg (ref 26.0–34.0)
MCHC: 28.9 g/dL — ABNORMAL LOW (ref 30.0–36.0)
MCV: 100.9 fL — ABNORMAL HIGH (ref 80.0–100.0)
Monocytes Absolute: 0.8 10*3/uL (ref 0.1–1.0)
Monocytes Relative: 13 %
Neutro Abs: 4.4 10*3/uL (ref 1.7–7.7)
Neutrophils Relative %: 70 %
Platelet Count: 151 10*3/uL (ref 150–400)
RBC: 3.43 MIL/uL — ABNORMAL LOW (ref 3.87–5.11)
RDW: 14.7 % (ref 11.5–15.5)
WBC Count: 6.4 10*3/uL (ref 4.0–10.5)
nRBC: 0 % (ref 0.0–0.2)

## 2019-08-20 LAB — SAMPLE TO BLOOD BANK

## 2019-08-20 MED ORDER — EPOETIN ALFA-EPBX 40000 UNIT/ML IJ SOLN
40000.0000 [IU] | Freq: Once | INTRAMUSCULAR | Status: AC
  Administered 2019-08-20: 40000 [IU] via SUBCUTANEOUS

## 2019-08-20 MED ORDER — EPOETIN ALFA-EPBX 40000 UNIT/ML IJ SOLN
INTRAMUSCULAR | Status: AC
  Filled 2019-08-20: qty 1

## 2019-08-20 NOTE — Patient Instructions (Signed)

## 2019-08-21 ENCOUNTER — Ambulatory Visit (HOSPITAL_COMMUNITY): Payer: Medicare Other

## 2019-08-21 ENCOUNTER — Encounter (HOSPITAL_COMMUNITY)
Admission: RE | Disposition: A | Discharge status: Home / Self Care | Payer: Self-pay | Source: Other Acute Inpatient Hospital | Attending: Urology

## 2019-08-21 ENCOUNTER — Ambulatory Visit (HOSPITAL_COMMUNITY)
Admission: RE | Admit: 2019-08-21 | Discharge: 2019-08-21 | Disposition: A | Discharge status: Home / Self Care | Payer: Medicare Other | Source: Other Acute Inpatient Hospital | Attending: Urology | Admitting: Urology

## 2019-08-21 ENCOUNTER — Ambulatory Visit (HOSPITAL_COMMUNITY): Payer: Medicare Other | Admitting: Anesthesiology

## 2019-08-21 ENCOUNTER — Ambulatory Visit (HOSPITAL_COMMUNITY): Payer: Medicare Other | Admitting: Physician Assistant

## 2019-08-21 ENCOUNTER — Encounter (HOSPITAL_COMMUNITY): Payer: Self-pay | Admitting: Urology

## 2019-08-21 DIAGNOSIS — I480 Paroxysmal atrial fibrillation: Secondary | ICD-10-CM | POA: Insufficient documentation

## 2019-08-21 DIAGNOSIS — E785 Hyperlipidemia, unspecified: Secondary | ICD-10-CM | POA: Insufficient documentation

## 2019-08-21 DIAGNOSIS — I6523 Occlusion and stenosis of bilateral carotid arteries: Secondary | ICD-10-CM | POA: Insufficient documentation

## 2019-08-21 DIAGNOSIS — Z881 Allergy status to other antibiotic agents status: Secondary | ICD-10-CM | POA: Insufficient documentation

## 2019-08-21 DIAGNOSIS — Z833 Family history of diabetes mellitus: Secondary | ICD-10-CM | POA: Insufficient documentation

## 2019-08-21 DIAGNOSIS — Z952 Presence of prosthetic heart valve: Secondary | ICD-10-CM | POA: Diagnosis not present

## 2019-08-21 DIAGNOSIS — H903 Sensorineural hearing loss, bilateral: Secondary | ICD-10-CM | POA: Insufficient documentation

## 2019-08-21 DIAGNOSIS — I252 Old myocardial infarction: Secondary | ICD-10-CM | POA: Diagnosis not present

## 2019-08-21 DIAGNOSIS — N184 Chronic kidney disease, stage 4 (severe): Secondary | ICD-10-CM | POA: Diagnosis not present

## 2019-08-21 DIAGNOSIS — J4540 Moderate persistent asthma, uncomplicated: Secondary | ICD-10-CM | POA: Insufficient documentation

## 2019-08-21 DIAGNOSIS — I5032 Chronic diastolic (congestive) heart failure: Secondary | ICD-10-CM | POA: Diagnosis not present

## 2019-08-21 DIAGNOSIS — E1136 Type 2 diabetes mellitus with diabetic cataract: Secondary | ICD-10-CM | POA: Diagnosis not present

## 2019-08-21 DIAGNOSIS — Z91040 Latex allergy status: Secondary | ICD-10-CM | POA: Diagnosis not present

## 2019-08-21 DIAGNOSIS — I2510 Atherosclerotic heart disease of native coronary artery without angina pectoris: Secondary | ICD-10-CM | POA: Diagnosis not present

## 2019-08-21 DIAGNOSIS — Z8673 Personal history of transient ischemic attack (TIA), and cerebral infarction without residual deficits: Secondary | ICD-10-CM | POA: Diagnosis not present

## 2019-08-21 DIAGNOSIS — N200 Calculus of kidney: Secondary | ICD-10-CM

## 2019-08-21 DIAGNOSIS — E1122 Type 2 diabetes mellitus with diabetic chronic kidney disease: Secondary | ICD-10-CM | POA: Insufficient documentation

## 2019-08-21 DIAGNOSIS — Z951 Presence of aortocoronary bypass graft: Secondary | ICD-10-CM | POA: Diagnosis not present

## 2019-08-21 DIAGNOSIS — H409 Unspecified glaucoma: Secondary | ICD-10-CM | POA: Insufficient documentation

## 2019-08-21 DIAGNOSIS — K219 Gastro-esophageal reflux disease without esophagitis: Secondary | ICD-10-CM | POA: Diagnosis not present

## 2019-08-21 DIAGNOSIS — Z8249 Family history of ischemic heart disease and other diseases of the circulatory system: Secondary | ICD-10-CM | POA: Insufficient documentation

## 2019-08-21 DIAGNOSIS — Z880 Allergy status to penicillin: Secondary | ICD-10-CM | POA: Diagnosis not present

## 2019-08-21 DIAGNOSIS — Z888 Allergy status to other drugs, medicaments and biological substances status: Secondary | ICD-10-CM | POA: Insufficient documentation

## 2019-08-21 HISTORY — PX: HOLMIUM LASER APPLICATION: SHX5852

## 2019-08-21 HISTORY — PX: CYSTOSCOPY WITH RETROGRADE PYELOGRAM, URETEROSCOPY AND STENT PLACEMENT: SHX5789

## 2019-08-21 LAB — GLUCOSE, CAPILLARY
Glucose-Capillary: 109 mg/dL — ABNORMAL HIGH (ref 70–99)
Glucose-Capillary: 123 mg/dL — ABNORMAL HIGH (ref 70–99)

## 2019-08-21 SURGERY — CYSTOURETEROSCOPY, WITH RETROGRADE PYELOGRAM AND STENT INSERTION
Anesthesia: General | Laterality: Left

## 2019-08-21 MED ORDER — GENTAMICIN IN SALINE 1-0.9 MG/ML-% IV SOLN
1.5000 mg/kg | Freq: Once | INTRAVENOUS | Status: DC
  Filled 2019-08-21: qty 100

## 2019-08-21 MED ORDER — IOHEXOL 300 MG/ML  SOLN
INTRAMUSCULAR | Status: DC | PRN
  Administered 2019-08-21: 10 mL

## 2019-08-21 MED ORDER — LACTATED RINGERS IV SOLN: INTRAVENOUS | Status: DC | PRN

## 2019-08-21 MED ORDER — DEXAMETHASONE SODIUM PHOSPHATE 10 MG/ML IJ SOLN
INTRAMUSCULAR | Status: AC
  Filled 2019-08-21: qty 1

## 2019-08-21 MED ORDER — LIDOCAINE 2% (20 MG/ML) 5 ML SYRINGE
INTRAMUSCULAR | Status: DC | PRN
  Administered 2019-08-21: 60 mg via INTRAVENOUS

## 2019-08-21 MED ORDER — PROPOFOL 10 MG/ML IV BOLUS
INTRAVENOUS | Status: DC | PRN
  Administered 2019-08-21: 110 mg via INTRAVENOUS

## 2019-08-21 MED ORDER — SODIUM CHLORIDE 0.9 % IV SOLN: INTRAVENOUS | Status: DC

## 2019-08-21 MED ORDER — GENTAMICIN SULFATE 40 MG/ML IJ SOLN: 5.0000 mg/kg | Freq: Once | INTRAVENOUS | Status: DC

## 2019-08-21 MED ORDER — FENTANYL CITRATE (PF) 100 MCG/2ML IJ SOLN
INTRAMUSCULAR | Status: AC
  Filled 2019-08-21: qty 2

## 2019-08-21 MED ORDER — LIDOCAINE 2% (20 MG/ML) 5 ML SYRINGE
INTRAMUSCULAR | Status: AC
  Filled 2019-08-21: qty 5

## 2019-08-21 MED ORDER — PHENYLEPHRINE 40 MCG/ML (10ML) SYRINGE FOR IV PUSH (FOR BLOOD PRESSURE SUPPORT)
PREFILLED_SYRINGE | INTRAVENOUS | Status: DC | PRN
  Administered 2019-08-21: 80 ug via INTRAVENOUS

## 2019-08-21 MED ORDER — ONDANSETRON HCL 4 MG/2ML IJ SOLN
INTRAMUSCULAR | Status: DC | PRN
  Administered 2019-08-21: 4 mg via INTRAVENOUS

## 2019-08-21 MED ORDER — DEXAMETHASONE SODIUM PHOSPHATE 10 MG/ML IJ SOLN
INTRAMUSCULAR | Status: DC | PRN
  Administered 2019-08-21: 10 mg via INTRAVENOUS

## 2019-08-21 MED ORDER — FENTANYL CITRATE (PF) 100 MCG/2ML IJ SOLN
INTRAMUSCULAR | Status: DC | PRN
  Administered 2019-08-21 (×2): 25 ug via INTRAVENOUS
  Administered 2019-08-21: 50 ug via INTRAVENOUS

## 2019-08-21 MED ORDER — ACETAMINOPHEN 500 MG PO TABS
1000.0000 mg | ORAL_TABLET | Freq: Once | ORAL | Status: AC
  Administered 2019-08-21: 1000 mg via ORAL
  Filled 2019-08-21: qty 2

## 2019-08-21 MED ORDER — SODIUM CHLORIDE 0.9 % IR SOLN
Status: DC | PRN
  Administered 2019-08-21: 3000 mL

## 2019-08-21 MED ORDER — OXYCODONE HCL 5 MG PO TABS: 5.0000 mg | ORAL_TABLET | ORAL | 0 refills | Status: DC | PRN

## 2019-08-21 MED ORDER — PROPOFOL 10 MG/ML IV BOLUS
INTRAVENOUS | Status: AC
  Filled 2019-08-21: qty 20

## 2019-08-21 MED ORDER — ONDANSETRON HCL 4 MG/2ML IJ SOLN
INTRAMUSCULAR | Status: AC
  Filled 2019-08-21: qty 2

## 2019-08-21 SURGICAL SUPPLY — 26 items
BAG URO CATCHER STRL LF (MISCELLANEOUS) ×2 IMPLANT
CATH INTERMIT  6FR 70CM (CATHETERS) ×2 IMPLANT
CLOTH BEACON ORANGE TIMEOUT ST (SAFETY) ×2 IMPLANT
COVER SURGICAL LIGHT HANDLE (MISCELLANEOUS) ×2 IMPLANT
COVER WAND RF STERILE (DRAPES) IMPLANT
EXTRACTOR STONE NITINOL NGAGE (UROLOGICAL SUPPLIES) IMPLANT
FIBER LASER FLEXIVA 1000 (UROLOGICAL SUPPLIES) IMPLANT
FIBER LASER FLEXIVA 365 (UROLOGICAL SUPPLIES) IMPLANT
FIBER LASER FLEXIVA 550 (UROLOGICAL SUPPLIES) IMPLANT
FIBER LASER TRAC TIP (UROLOGICAL SUPPLIES) ×2 IMPLANT
GLOVE BIO SURGEON STRL SZ8 (GLOVE) ×2 IMPLANT
GOWN STRL REUS W/TWL XL LVL3 (GOWN DISPOSABLE) ×2 IMPLANT
GUIDEWIRE ANG ZIPWIRE 038X150 (WIRE) ×2 IMPLANT
GUIDEWIRE STR DUAL SENSOR (WIRE) ×2 IMPLANT
IV NS 1000ML (IV SOLUTION) ×2
IV NS 1000ML BAXH (IV SOLUTION) ×1 IMPLANT
KIT TURNOVER KIT A (KITS) IMPLANT
MANIFOLD NEPTUNE II (INSTRUMENTS) ×2 IMPLANT
PACK CYSTO (CUSTOM PROCEDURE TRAY) ×2 IMPLANT
PENCIL SMOKE EVACUATOR (MISCELLANEOUS) IMPLANT
SHEATH URETERAL 12FRX35CM (MISCELLANEOUS) ×2 IMPLANT
STENT URET 6FRX24 CONTOUR (STENTS) ×2 IMPLANT
STENT URET 6FRX26 CONTOUR (STENTS) IMPLANT
TUBE FEEDING 8FR 16IN STR KANG (MISCELLANEOUS) IMPLANT
TUBING CONNECTING 10 (TUBING) ×2 IMPLANT
TUBING UROLOGY SET (TUBING) IMPLANT

## 2019-08-21 NOTE — Transfer of Care (Signed)
Immediate Anesthesia Transfer of Care Note  Patient: Hannah [name]  Procedure(s) Performed: CYSTOSCOPY WITH RETROGRADE PYELOGRAM, URETEROSCOPY AND STENT PLACEMENT (Left ) HOLMIUM LASER APPLICATION (Left )  Patient Location: PACU  Anesthesia Type:General  Level of Consciousness: awake and drowsy  Airway & Oxygen Therapy: Patient Spontanous Breathing and Patient connected to face mask oxygen  Post-op Assessment: Report given to RN and Post -op Vital signs reviewed and stable  Post vital signs: Reviewed and stable  Last Vitals:  Vitals Value Taken Time  BP 152/50 08/21/19 1335  Temp    Pulse 54 08/21/19 1339  Resp 16 08/21/19 1339  SpO2 100 % 08/21/19 1339  Vitals shown include unvalidated device data.  Last Pain:  Vitals:   08/21/19 1102  TempSrc:   PainSc: 0-No pain         Complications: No apparent anesthesia complications

## 2019-08-21 NOTE — Discharge Instructions (Signed)
Ureteral Stent Implantation, Care After °This sheet gives you information about how to care for yourself after your procedure. Your health care provider may also give you more specific instructions. If you have problems or questions, contact your health care provider. °What can I expect after the procedure? °After the procedure, it is common to have: °· Nausea. °· Mild pain when you urinate. You may feel this pain in your lower back or lower abdomen. The pain should stop within a few minutes after you urinate. This may last for up to 1 week. °· A small amount of blood in your urine for several days. °Follow these instructions at home: °Medicines °· Take over-the-counter and prescription medicines only as told by your health care provider. °· If you were prescribed an antibiotic medicine, take it as told by your health care provider. Do not stop taking the antibiotic even if you start to feel better. °· Do not drive for 24 hours if you were given a sedative during your procedure. °· Ask your health care provider if the medicine prescribed to you requires you to avoid driving or using heavy machinery. °Activity °· Rest as told by your health care provider. °· Avoid sitting for a long time without moving. Get up to take short walks every 1-2 hours. This is important to improve blood flow and breathing. Ask for help if you feel weak or unsteady. °· Return to your normal activities as told by your health care provider. Ask your health care provider what activities are safe for you. °General instructions ° °· Watch for any blood in your urine. Call your health care provider if the amount of blood in your urine increases. °· If you have a catheter: °? Follow instructions from your health care provider about taking care of your catheter and collection bag. °? Do not take baths, swim, or use a hot tub until your health care provider approves. Ask your health care provider if you may take showers. You may only be allowed to  take sponge baths. °· Drink enough fluid to keep your urine pale yellow. °· Do not use any products that contain nicotine or tobacco, such as cigarettes, e-cigarettes, and chewing tobacco. These can delay healing after surgery. If you need help quitting, ask your health care provider. °· Keep all follow-up visits as told by your health care provider. This is important. °Contact a health care provider if: °· You have pain that gets worse or does not get better with medicine, especially pain when you urinate. °· You have difficulty urinating. °· You feel nauseous or you vomit repeatedly during a period of more than 2 days after the procedure. °Get help right away if: °· Your urine is dark red or has blood clots in it. °· You are leaking urine (have incontinence). °· The end of the stent comes out of your urethra. °· You cannot urinate. °· You have sudden, sharp, or severe pain in your abdomen or lower back. °· You have a fever. °· You have swelling or pain in your legs. °· You have difficulty breathing. °Summary °· After the procedure, it is common to have mild pain when you urinate that goes away within a few minutes after you urinate. This may last for up to 1 week. °· Watch for any blood in your urine. Call your health care provider if the amount of blood in your urine increases. °· Take over-the-counter and prescription medicines only as told by your health care provider. °· Drink   enough fluid to keep your urine pale yellow. °This information is not intended to replace advice given to you by your health care provider. Make sure you discuss any questions you have with your health care provider. °Document Revised: 03/04/2018 Document Reviewed: 03/05/2018 °[employer] Patient Education © 2020 [employer] ° °

## 2019-08-21 NOTE — H&P (Signed)
Urology Admission H&P  Chief Complaint: left flank pain  History of Present Illness: Ms Melby is a 74yo with a hx of nephrolithiasis and gross hematuria here for left ureteroscopic stone extraction. She is having intermittent sharp left flank pain.   Past Medical History:  Diagnosis Date  . Abnormally small mouth   . Allergic rhinitis 10/14/2009   Qualifier: Diagnosis of  By: Lamonte Sakai MD, Rose Fillers   Overview:  Overview:  Qualifier: Diagnosis of  By: Lamonte Sakai MD, Rose Fillers  Last Assessment & Plan:  Please continue Xyzal and Nasacort as you have been using them  . Anemia   . Asthma 05/12/2009   10/12/2014 p extensive coaching HFA effectiveness =    75% s spacer    Overview:  Overview:  10/12/2014 p extensive coaching HFA effectiveness =    75% s spacer   Last Assessment & Plan:  Please continue Symbicort 2 puffs twice a day. Remember to rinse and gargle after taking this medication. Take albuterol 2 puffs up to every 4 hours if needed for shortness of breath.  Follow with Dr Lamonte Sakai in 6 month  . Bilateral carotid artery stenosis    a. mild 1-39% by duplex 10/2017, due 2021.  . Breast cancer ([place]) 01/18/2016   right breast  . CAD (coronary artery disease) 01/24/2011   a. s/p CABG 1998 with redo 2009.  Marland Kitchen Chemotherapy induced nausea and vomiting 02/24/2016  . Chemotherapy-induced peripheral neuropathy ([place]) 04/27/2016  . 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 Chronic diastolic CHF (congestive heart failure) ([place]) 09/24/2013  . CKD (chronic kidney disease), stage IV ([place]) 09/24/2013  . Closed fracture of head of left humerus 09/09/2017  . Complication of anesthesia   . Dysrhythmia    a-fib  . Gallstones   . GERD (gastroesophageal reflux disease)   . Glaucoma   . H/O atrial tachycardia 05/12/2009   Qualifier: History of  By: Lamonte Sakai MD, Rose Fillers   Overview:  Overview:  Qualifier: History of  By: Lamonte Sakai MD, Rose Fillers  Last Assessment & Plan:  There was no evidence of this on her cardiac monitor. I would  recommend watchful waiting.   Marland Kitchen Heart murmur   . History of kidney stones   . History of non-ST elevation myocardial infarction (NSTEMI)    Sept 2014--  thought to be type II HTN w/ LHC without infarct related artery and patent grafts  . History of radiation therapy 05/24/16-07/26/16   right breast 50.4 Gy in 28 fractions, right breast boost 10 Gy in 5 fractions  . Hyperlipidemia   . Hypotension   . Iron deficiency anemia   . Mania (Lead) 03/05/2016  . Moderate persistent asthma    pulmologist-  Dr. Malvin Johns  . Osteomyelitis of toe of left foot ([place]) 04/06/2016  . Osteopenia of multiple sites 10/19/2015  . PAF (paroxysmal atrial fibrillation) ([place])   . Personal history of chemotherapy 2017  . Personal history of radiation therapy 2017  . PONV (postoperative nausea and vomiting)   . Port catheter in place 02/17/2016  . Psoriasis    right leg  . Renal calculus, right   . S/P AVR    prosthesis valve placement 2009 at same time re-do CABG  . Seizure-like activity ([place]) 09/01/2017  . Sensorineural hearing loss (SNHL) of both ears 01/06/2016  . Stroke [facility]) 2014   residual rt hearing loss  . Syncope 09/06/2017  . Type 2 diabetes mellitus ([place])    monitored by dr Dwyane Dee   Past Surgical  History:  Procedure Laterality Date  . AORTIC VALVE REPLACEMENT  2009   #78mm [employer] [facility] pericardial valve (done same time is CABG)  . BREAST LUMPECTOMY Right 01/18/2016  . BREAST LUMPECTOMY WITH RADIOACTIVE SEED AND SENTINEL LYMPH NODE BIOPSY Right 01/18/2016   Procedure: RIGHT BREAST LUMPECTOMY WITH RADIOACTIVE SEED AND SENTINEL LYMPH NODE BIOPSY;  Surgeon: Alphonsa Overall, MD;  Location: [facility];  Service: General;  Laterality: Right;  . CARDIAC CATHETERIZATION  03/23/2008   Pre-redo CABG: L main OK, LAD (T), CFX (T), OM1 99%, RCA (T), LIMA-LAD OK, SVG-OM(?3) OK w/ little florw to OM2, SVG-RCA OK. EF NL  . CARPAL TUNNEL RELEASE    . CHEST TUBE INSERTION Right 06/26/2019   Procedure: INSERTION PLEURAL  DRAINAGE CATHETER;  Surgeon: Gaye Pollack, MD;  Location: [place];  Service: Thoracic;  Laterality: Right;  . CHOLECYSTECTOMY N/A 07/07/2018   Procedure: LAPAROSCOPIC CHOLECYSTECTOMY WITH INTRAOPERATIVE CHOLANGIOGRAM ERAS PATHWAY;  Surgeon: Alphonsa Overall, MD;  Location: [facility];  Service: General;  Laterality: N/A;  . COLONOSCOPY     around 2015. Possibly with [facility]   . CORONARY ARTERY BYPASS GRAFT  1998 &  re-do 2009   Had LIMA to DX/LAD, SVG to 2 marginal branches and SVG to [facility] originally; SVG to 3rd OM and PD at time of redo  . CYSTOSCOPY W/ URETERAL STENT PLACEMENT Right 12/20/2014   Procedure: CYSTOSCOPY WITH RETROGRADE PYELOGRAM/URETERAL STENT PLACEMENT;  Surgeon: Cleon Gustin, MD;  Location: [facility];  Service: Urology;  Laterality: Right;  . ESOPHAGOGASTRODUODENOSCOPY     many years ago per patient   . ESOPHAGOGASTRODUODENOSCOPY ENDOSCOPY  06/17/2018  . EYE SURGERY Bilateral    cataracts  . HOLMIUM LASER APPLICATION Right 2/35/3614   Procedure:  HOLMIUM LASER LITHOTRIPSY;  Surgeon: Cleon Gustin, MD;  Location: [facility];  Service: Urology;  Laterality: Right;  . IR THORACENTESIS ASP PLEURAL SPACE W/IMG GUIDE  10/03/2018  . IR THORACENTESIS ASP PLEURAL SPACE W/IMG GUIDE  06/08/2019  . LEFT HEART CATHETERIZATION WITH CORONARY/GRAFT ANGIOGRAM N/A 02/23/2013   Procedure: LEFT HEART CATHETERIZATION WITH Beatrix Fetters;  Surgeon: Blane Ohara, MD;  Location: [facility] CATH LAB;  Service: Cardiovascular;  Laterality: N/A;  . LOOP RECORDER INSERTION N/A 08/30/2017   Procedure: LOOP RECORDER INSERTION;  Surgeon: Evans Lance, MD;  Location: [facility] CV LAB;  Service: Cardiovascular;  Laterality: N/A;  . PORTACATH PLACEMENT Left 01/18/2016   Procedure: INSERTION PORT-A-CATH;  Surgeon: Alphonsa Overall, MD;  Location: [facility];  Service: General;  Laterality: Left;  . portacath removal    . REMOVAL OF PLEURAL DRAINAGE CATHETER Right 07/14/2019    Procedure: REMOVAL OF PLEURAL DRAINAGE CATHETER;  Surgeon: Gaye Pollack, MD;  Location: [facility];  Service: Thoracic;  Laterality: Right;  . TALC PLEURODESIS N/A 06/26/2019   Procedure: Pietro Cassis;  Surgeon: Gaye Pollack, MD;  Location: [facility];  Service: Thoracic;  Laterality: N/A;  . TONSILLECTOMY    . TRANSTHORACIC ECHOCARDIOGRAM  02-24-2013      mild LVH,  ef 50-55%/  AV bioprosthesis was present with very mild stenosis and no regurg., mean grandient 29mmHg, peak grandient 50mmHg /  mild MR/  mild LAE and RAE/  moderate TR  . TUBAL LIGATION      Home Medications:  Current Facility-Administered Medications  Medication Dose Route Frequency Provider Last Rate Last Admin  . 0.9 %  sodium chloride infusion   Intravenous Continuous Brennan Bailey, MD 50 mL/hr at  08/21/19 1138 New Bag at 08/21/19 1138  . gentamicin (GARAMYCIN) IVPB 99 mg  1.5 mg/kg (Order-Specific) Intravenous Once Thomes Lolling, [facility]       Allergies:  Allergies  Allergen Reactions  . Amoxicillin Rash and Other (See Comments)    Tolerates Cephalosporins Has patient had a PCN reaction causing immediate rash, facial/tongue/throat swelling, SOB or lightheadedness with hypotension: Yes Has patient had a PCN reaction causing severe rash involving mucus membranes or skin necrosis: Yes Has patient had a PCN reaction that required hospitalization No Has patient had a PCN reaction occurring within the last 10 years: No If all of the above answers are "NO", then may proceed with Cephalosporin use.   . Tape Other (See Comments)    Must use paper tape  . Aldactone [Spironolactone] Other (See Comments)    CKD/hypokalemia  . Imdur [Isosorbide Dinitrate] Other (See Comments)    Headache/severe hypotension/Syncope  . Arimidex [Anastrozole] Nausea Only  . Latex Itching and Other (See Comments)    (Dentist office)  . Tetracycline Rash    Family History  Problem Relation Age of Onset  . Heart disease Father   . Heart  failure Father   . Diabetes Maternal Grandmother   . Heart disease Maternal Grandmother   . Diabetes Son   . Healthy Brother        #1  . Heart attack Brother        #2  . Heart disease Brother        #2  . Colon cancer Neg Hx   . Esophageal cancer Neg Hx    Social History:  reports that she has never smoked. She has never used smokeless tobacco. She reports that she does not drink alcohol or use drugs.  Review of Systems  Genitourinary: Positive for flank pain and hematuria.  All other systems reviewed and are negative.   Physical Exam:  Vital signs in last 24 hours: Temp:  [98.1 F (36.7 C)-98.2 F (36.8 C)] 98.1 F (36.7 C) (03/12 1039) Pulse Rate:  [62] 62 (03/12 1039) Resp:  [16-18] 16 (03/12 1039) BP: (122-175)/(60-68) 175/60 (03/12 1039) SpO2:  [99 %-100 %] 99 % (03/12 1039) Weight:  [62.1 kg] 62.1 kg (03/12 1105) Physical Exam  Constitutional: She is oriented to person, place, and time. She appears well-developed and well-nourished.  HENT:  Head: Normocephalic and atraumatic.  Eyes: Pupils are equal, round, and reactive to light. EOM are normal.  Neck: No thyromegaly present.  Cardiovascular: Normal rate and regular rhythm.  Respiratory: Effort normal. No respiratory distress.  GI: Soft. She exhibits no distension.  Musculoskeletal:        General: No edema. Normal range of motion.     Cervical back: Normal range of motion.  Neurological: She is alert and oriented to person, place, and time.  Skin: Skin is warm and dry.  Psychiatric: She has a normal mood and affect. Her behavior is normal. Judgment and thought content normal.    Laboratory Data:  Results for orders placed or performed during the hospital encounter of 08/21/19 (from the past 24 hour(s))  Glucose, capillary     Status: Abnormal   Collection Time: 08/21/19 10:38 AM  Result Value Ref Range   Glucose-Capillary 109 (H) 70 - 99 mg/dL   Comment 1 Notify RN    Comment 2 Document in Chart     *Note: Due to a large number of results and/or encounters for the requested time period, some results have not been  displayed. A complete set of results can be found in Results Review.   Recent Results (from the past 240 hour(s))  SARS CORONAVIRUS 2 (TAT 6-24 HRS) Nasopharyngeal Nasopharyngeal Swab     Status: None   Collection Time: 08/18/19  2:07 PM   Specimen: Nasopharyngeal Swab  Result Value Ref Range Status   SARS Coronavirus 2 NEGATIVE NEGATIVE Final    Comment: (NOTE) SARS-CoV-2 target nucleic acids are NOT DETECTED. The SARS-CoV-2 RNA is generally detectable in upper and lower respiratory specimens during the acute phase of infection. Negative results do not preclude SARS-CoV-2 infection, do not rule out co-infections with other pathogens, and should not be used as the sole basis for treatment or other patient management decisions. Negative results must be combined with clinical observations, patient history, and epidemiological information. The expected result is Negative. Fact Sheet for Patients: SugarRoll.be Fact Sheet for Healthcare Providers: https://www.woods-mathews.com/ This test is not yet approved or cleared by the [place] FDA and  has been authorized for detection and/or diagnosis of SARS-CoV-2 by FDA under an Emergency Use Authorization (EUA). This EUA will remain  in effect (meaning this test can be used) for the duration of the COVID-19 declaration under Section 56 4(b)(1) of the Act, 21 U.S.C. section 360bbb-3(b)(1), unless the authorization is terminated or revoked sooner. Performed at [facility], [place] [address] [place], [place] [address]    Creatinine: Recent Labs    08/18/19 1343  CREATININE 3.43*   Baseline Creatinine: 3  Impression/Assessment:  73yo with nephrolithiasis  Plan:  The risks/benefits/alternatives to left  Ureteroscopic stone extraction was explained to the patient and she  understands and wishes to proceed with surgery  Nicolette Bang 08/21/2019, 12:19 PM

## 2019-08-21 NOTE — Op Note (Signed)
.  Preoperative diagnosis: Left renal stone  Postoperative diagnosis: Same  Procedure: 1 cystoscopy 2. Left retrograde pyelography 3.  Intraoperative fluoroscopy, under one hour, with interpretation 4.  Left ureteroscopic stone manipulation with laser lithotripsy 5.  Left 6 x 24 JJ stent placement  Attending: Rosie Fate  Anesthesia: General  Estimated blood loss: None  Drains: Left 6 x 26 JJ ureteral stent without tether  Specimens: stone for analysis  Antibiotics: gentamicin  Findings: left renal pelvis stone. No hydronephrosis. No masses/lesions in the bladder. Ureteral orifices in normal anatomic location.  Indications: Patient is a 74 year old female with a history of left renal stone and who has persistent left flank pain.  After discussing treatment options, she decided proceed with left ureteroscopic stone manipulation.  Procedure her in detail: The patient was brought to the operating room and a brief timeout was done to ensure correct patient, correct procedure, correct site.  General anesthesia was administered patient was placed in dorsal lithotomy position.  Her genitalia was then prepped and draped in usual sterile fashion.  A rigid 34 French cystoscope was passed in the urethra and the bladder.  Bladder was inspected free masses or lesions.  the ureteral orifices were in the normal orthotopic locations.  a 6 french ureteral catheter was then instilled into the left ureteral orifice.  a gentle retrograde was obtained and findings noted above.  we then placed a zip wire through the ureteral catheter and advanced up to the renal pelvis.  we then removed the cystoscope and cannulated the left ureteral orifice with a semirigid ureteroscope.  No stone was found in the ureter. Once we reached the UPJ a sensor wire was advanced in to the renal pelvis. We then removed the ureteroscope and advanced am 12/14 x 35cm access sheath up to the renal pelvis. We then used the flexible  ureteroscope to perform nephroscopy. We encountered the stone in the renal pelvis.  Using a 200nm laser fiber the stone was fragmented.   the pieces were then removed with a Ngage basket.    once all stone fragments were removed we then removed the access sheath under direct vision and noted no injury to the ureter. We then placed a 6 x 26 double-j ureteral stent over the original zip wire.  We then removed the wire and good coil was noted in the the renal pelvis under fluoroscopy and the bladder under direct vision. the bladder was then drained and this concluded the procedure which was well tolerated by patient.  Complications: None  Condition: Stable, extubated, transferred to PACU  Plan: Patient is to be discharged home as to follow-up in one week for stent removal.

## 2019-08-21 NOTE — Anesthesia Postprocedure Evaluation (Signed)
Anesthesia Post Note  Patient: MADAILEIN LONDO  Procedure(s) Performed: CYSTOSCOPY WITH RETROGRADE PYELOGRAM, URETEROSCOPY AND STENT PLACEMENT (Left ) HOLMIUM LASER APPLICATION (Left )     Patient location during evaluation: PACU Anesthesia Type: General Level of consciousness: awake and alert and oriented Pain management: pain level controlled Vital Signs Assessment: post-procedure vital signs reviewed and stable Respiratory status: spontaneous breathing, nonlabored ventilation and respiratory function stable Cardiovascular status: blood pressure returned to baseline Postop Assessment: no apparent nausea or vomiting Anesthetic complications: no    Last Vitals:  Vitals:   08/21/19 1400 08/21/19 1408  BP: (!) 132/47 (!) 133/48  Pulse: (!) 53 (!) 54  Resp: 17 18  Temp: 36.4 C 36.4 C  SpO2: 100% 100%    Last Pain:  Vitals:   08/21/19 1408  TempSrc:   PainSc: 0-No pain                 Brennan Bailey

## 2019-08-21 NOTE — Anesthesia Procedure Notes (Signed)
Procedure Name: LMA Insertion Date/Time: 08/21/2019 12:41 PM Performed by: [place] Tyshana Nishida C, CRNA Pre-anesthesia Checklist: Patient identified, Emergency Drugs available, Suction available and Patient being monitored Patient Re-evaluated:Patient Re-evaluated prior to induction Oxygen Delivery Method: Circle system utilized Preoxygenation: Pre-oxygenation with 100% oxygen Induction Type: IV induction Ventilation: Mask ventilation without difficulty LMA: LMA inserted LMA Size: 4.0 Number of attempts: 1 Airway Equipment and Method: Bite block Placement Confirmation: positive ETCO2 Tube secured with: Tape Dental Injury: Teeth and Oropharynx as per pre-operative assessment

## 2019-08-21 NOTE — Anesthesia Preprocedure Evaluation (Addendum)
Anesthesia Evaluation  Patient identified by MRN, date of birth, ID band Patient awake    Reviewed: Allergy & Precautions, NPO status , Patient's Chart, lab work & pertinent test results, reviewed documented beta blocker date and time   History of Anesthesia Complications (+) PONV and history of anesthetic complications  Airway Mallampati: II  TM Distance: >3 FB Neck ROM: Full    Dental  (+) Missing,    Pulmonary asthma ,    Pulmonary exam normal        Cardiovascular hypertension, Pt. on home beta blockers and Pt. on medications + CAD, + Past MI (2014), + CABG (1998, redo in 2009) and +CHF  Normal cardiovascular exam+ dysrhythmias (on Eliquis) Atrial Fibrillation   TTE 05/2019: EF 60-65%, grade II diastolic dysfunction, severe LAE, mild MR   Neuro/Psych CVA negative psych ROS   GI/Hepatic Neg liver ROS, GERD  Medicated and Controlled,  Endo/Other  diabetes, Type 2, Insulin Dependent  Renal/GU Renal InsufficiencyRenal disease  negative genitourinary   Musculoskeletal negative musculoskeletal ROS (+)   Abdominal   Peds  Hematology  (+) anemia , Hgb 10.0   Anesthesia Other Findings Day of surgery medications reviewed with patient.  Reproductive/Obstetrics negative OB ROS                            Anesthesia Physical Anesthesia Plan  ASA: III  Anesthesia Plan: General   Post-op Pain Management:    Induction: Intravenous  PONV Risk Score and Plan: 4 or greater and Treatment may vary due to age or medical condition, Ondansetron, Dexamethasone, Midazolam and Propofol infusion  Airway Management Planned: LMA  Additional Equipment: None  Intra-op Plan:   Post-operative Plan: Extubation in OR  Informed Consent: I have reviewed the patients History and Physical, chart, labs and discussed the procedure including the risks, benefits and alternatives for the proposed anesthesia with the  patient or authorized representative who has indicated his/her understanding and acceptance.     Dental advisory given  Plan Discussed with: CRNA  Anesthesia Plan Comments:        Anesthesia Quick Evaluation

## 2019-08-24 ENCOUNTER — Ambulatory Visit (INDEPENDENT_AMBULATORY_CARE_PROVIDER_SITE_OTHER): Payer: Medicare Other | Admitting: *Deleted

## 2019-08-24 DIAGNOSIS — R55 Syncope and collapse: Secondary | ICD-10-CM

## 2019-08-24 LAB — CUP PACEART REMOTE DEVICE CHECK
Date Time Interrogation Session: 20210314002336
Implantable Pulse Generator Implant Date: 20190322

## 2019-08-24 NOTE — Progress Notes (Signed)
Hannah, [name] ([id number]) Visit Report for 08/18/2019 Arrival Information Details Patient Name: Date of Service: Hannah [name], Hannah [name] 08/18/2019 3:00 PM Medical Record [id number] Patient Account Number: [id number] Date of Birth/Sex: Treating RN: Apr 24, 1946 (74 y.o. Nancy Fetter Primary Care Wenzel Backlund: [name] Other Clinician: Referring Asim Gersten: Treating Yashar Inclan/Extender:Robson, Quincy Carnes, [place] Weeks in Treatment: 2 Visit Information History Since Last Visit Added or deleted any medications: No Patient Arrived: Ambulatory Any new allergies or adverse reactions: No Arrival Time: 15:34 Had a fall or experienced change in No Accompanied By: husband activities of daily living that may affect Transfer Assistance: None risk of falls: Patient Identification Verified: Yes Signs or symptoms of abuse/neglect since last No Secondary Verification Process Completed: Yes visito Patient Requires Transmission-Based No Hospitalized since last visit: No Precautions: Implantable device outside of the clinic excluding No Patient Has Alerts: No cellular tissue based products placed in the center since last visit: Has Dressing in Place as Prescribed: Yes Pain Present Now: No Electronic Signature(s) Signed: 08/24/2019 5:44:18 PM By: Levan Hurst RN, BSN Entered By: Levan Hurst on 08/18/2019 15:34:50 -------------------------------------------------------------------------------- Encounter Discharge Information Details Patient Name: Date of Service: Hannah [name]. 08/18/2019 3:00 PM Medical Record [id number] Patient Account Number: [id number] Date of Birth/Sex: Treating RN: Feb 01, 1946 (74 y.o. Clearnce Sorrel Primary Care Fredia Chittenden: [name] Other Clinician: Referring Dereke Neumann: Treating Shynia Daleo/Extender:Robson, Quincy Carnes, Marcelle Smiling in Treatment: 2 Encounter Discharge Information Items Post Procedure Vitals Discharge Condition:  Stable Temperature (F): 98.5 Ambulatory Status: Ambulatory Pulse (bpm): 57 Discharge Destination: Home Respiratory Rate (breaths/min): 16 Transportation: Private Auto Blood Pressure (mmHg): 116/74 Accompanied By: husband Schedule Follow-up Appointment: Yes Clinical Summary of Care: Patient Declined Electronic Signature(s) Signed: 08/18/2019 5:46:17 PM By: Kela Millin Entered By: Kela Millin on 08/18/2019 16:03:04 -------------------------------------------------------------------------------- Multi Wound Chart Details Patient Name: Date of Service: Hannah [name]. 08/18/2019 3:00 PM Medical Record [id number] Patient Account Number: [id number] Date of Birth/Sex: Treating RN: 1946-03-27 (74 y.o. Orvan Falconer Primary Care Hattie Pine: [name] Other Clinician: Referring Seven Marengo: Treating Burnell Hurta/Extender:Robson, Quincy Carnes, [place] Weeks in Treatment: 2 Vital Signs Height(in): 64 Capillary Blood 121 Glucose(mg/dl): Weight(lbs): 145 Pulse(bpm): 84 Body Mass Index(BMI): 25 Blood Pressure(mmHg): 116/74 Temperature(F): 98.5 Respiratory 16 Rate(breaths/min): Photos: [1:No Photos] [N/A:N/A] Wound Location: [1:Right Gluteus] [N/A:N/A] Wounding Event: [1:Pressure Injury] [N/A:N/A] Primary Etiology: [1:Pressure Ulcer] [N/A:N/A] Comorbid History: [1:Cataracts, Anemia, Asthma, N/A Arrhythmia, Congestive Heart Failure, Coronary Artery Disease, Hypertension, Type II Diabetes, Osteoarthritis, Neuropathy, Received Chemotherapy, Received Radiation, Confinement Anxiety] Date Acquired: [1:07/04/2019] [N/A:N/A] Weeks of Treatment: [1:2] [N/A:N/A] Wound Status: [1:Open] [N/A:N/A] Measurements L x W x D 0.5x0.8x0.6 [N/A:N/A] (cm) Area (cm) : [1:0.314] [N/A:N/A] Volume (cm) : [1:0.188] [N/A:N/A] % Reduction in Area: [1:33.30%] [N/A:N/A] % Reduction in Volume: -100.00% [N/A:N/A] Starting Position 1 3 (o'clock): Ending Position 1 [1:6] (o'clock): Maximum  Distance 1 [1:0.2] (cm): Undermining: [1:Yes] [N/A:N/A] Classification: [1:Category/Stage III] [N/A:N/A] Exudate Amount: [1:Small] [N/A:N/A] Exudate Type: [1:Serous] [N/A:N/A] Exudate Color: [1:amber] [N/A:N/A] Wound Margin: [1:Flat and Intact] [N/A:N/A] Granulation Amount: [1:Small (1-33%)] [N/A:N/A] Granulation Quality: [1:Pink, Pale] [N/A:N/A] Necrotic Amount: [1:Large (67-100%)] [N/A:N/A] Exposed Structures: [1:Fat Layer (Subcutaneous Tissue) Exposed: Yes Fascia: No Tendon: No Muscle: No Joint: No Bone: No] [N/A:N/A] Epithelialization: [1:Small (1-33%)] [N/A:N/A] Debridement: [1:Debridement - Excisional] [N/A:N/A] Pre-procedure [1:15:51] [N/A:N/A] Verification/Time Out Taken: Pain Control: [1:Lidocaine 5% topical ointment] [N/A:N/A] Tissue Debrided: [1:Subcutaneous, Slough] [N/A:N/A] Level: [1:Skin/Subcutaneous Tissue] [N/A:N/A] Debridement Area (sq cm):0.4 [N/A:N/A] Instrument: [1:Curette] [N/A:N/A] Bleeding: [1:Moderate] [N/A:N/A] Hemostasis Achieved: [1:Pressure] [N/A:N/A] Procedural Pain: [1:0] [N/A:N/A] Post Procedural Pain: [1:0] [N/A:N/A] Debridement Treatment Procedure was  tolerated [N/A:N/A] Response: [1:well] Post Debridement [1:0.5x0.8x0.6] [N/A:N/A] Measurements L x W x D (cm) Post Debridement [1:0.188] [N/A:N/A] Volume: (cm) Post Debridement Stage: Category/Stage III [N/A:N/A N/A] Treatment Notes Electronic Signature(s) Signed: 08/18/2019 5:48:31 PM By: Linton Ham MD Signed: 08/19/2019 7:20:02 AM By: Carlene Coria RN Entered By: Linton Ham on 08/18/2019 15:56:23 -------------------------------------------------------------------------------- Multi-Disciplinary Care Plan Details Patient Name: Date of Service: Hannah [name]. 08/18/2019 3:00 PM Medical Record [id number] Patient Account Number: [id number] Date of Birth/Sex: Treating RN: [birth year] (74 y.o. Orvan Falconer Primary Care Amariyah Bazar: [name] Other Clinician: Referring  Shawnika Pepin: Treating Vona Whiters/Extender:Robson, Quincy Carnes, [place] Weeks in Treatment: 2 Active Inactive Wound/Skin Impairment Nursing Diagnoses: Knowledge deficit related to ulceration/compromised skin integrity Goals: Ulcer/skin breakdown will have a volume reduction of 30% by week 4 Date Initiated: 08/04/2019 Target Resolution Date: 09/04/2019 Goal Status: Active Ulcer/skin breakdown will have a volume reduction of 50% by week 8 Date Initiated: 08/04/2019 Target Resolution Date: 09/04/2019 Goal Status: Active Interventions: Assess patient/caregiver ability to obtain necessary supplies Assess patient/caregiver ability to perform ulcer/skin care regimen upon admission and as needed Assess ulceration(s) every visit Notes: Electronic Signature(s) Signed: 08/19/2019 7:20:02 AM By: Carlene Coria RN Entered By: Carlene Coria on 08/18/2019 15:23:05 -------------------------------------------------------------------------------- Pain Assessment Details Patient Name: Date of Service: REEM, FLEURY 08/18/2019 3:00 PM Medical Record [id number] Patient Account Number: [id number] Date of Birth/Sex: Treating RN: Jul 27, 1945 (74 y.o. Nancy Fetter Primary Care Maleni Seyer: [name] Other Clinician: Referring Ediberto Sens: Treating Shelbey Spindler/Extender:Robson, Quincy Carnes, [place] Weeks in Treatment: 2 Active Problems Location of Pain Severity and Description of Pain Patient Has Paino No Site Locations Pain Management and Medication Current Pain Management: Electronic Signature(s) Signed: 08/24/2019 5:44:18 PM By: Levan Hurst RN, BSN Entered By: Levan Hurst on 08/18/2019 15:37:41 -------------------------------------------------------------------------------- Patient/Caregiver Education Details Patient Name: Date of Service: Hannah [name] 3/9/2021andnbsp3:00 PM Medical Record [phone] Patient Account Number: [id number] Date of Birth/Gender: Treating  RN: 19-Jul-1945 (74 y.o. Orvan Falconer Primary Care Physician: [name] Other Clinician: Referring Physician: Treating Physician/Extender:Robson, Quincy Carnes, Marcelle Smiling in Treatment: 2 Education Assessment Education Provided To: Patient Education Topics Provided Wound/Skin Impairment: Methods: Explain/Verbal Responses: State content correctly Electronic Signature(s) Signed: 08/19/2019 7:20:02 AM By: Carlene Coria RN Entered By: Carlene Coria on 08/18/2019 15:23:23 -------------------------------------------------------------------------------- Wound Assessment Details Patient Name: Date of Service: Hannah [name]. 08/18/2019 3:00 PM Medical Record [id number] Patient Account Number: [id number] Date of Birth/Sex: Treating RN: Feb 09, 1946 (74 y.o. Orvan Falconer Primary Care Sajad Glander: [name] Other Clinician: Referring Yoshiaki Kreuser: Treating Silverio Hagan/Extender:Robson, Quincy Carnes, [place] Weeks in Treatment: 2 Wound Status Wound Number: 1 Primary Pressure Ulcer Etiology: Wound Location: Right Gluteus Wound Open Wounding Event: Pressure Injury Status: Date Acquired: 07/04/2019 Comorbid Cataracts, Anemia, Asthma, Arrhythmia, Weeks Of Treatment: 2 History: Congestive Heart Failure, Coronary Artery Clustered Wound: No Disease, Hypertension, Type II Diabetes, Osteoarthritis, Neuropathy, Received Chemotherapy, Received Radiation, Confinement Anxiety Photos Wound Measurements Length: (cm) 0.5 % Reduction in Are Width: (cm) 0.8 % Reduction in Vol Depth: (cm) 0.6 Epithelialization: Area: (cm) 0.314 Tunneling: Volume: (cm) 0.188 Undermining: Starting Positi Ending Position Maximum Distanc a: 33.3% ume: -100% Small (1-33%) No Yes on (o'clock): 3 (o'clock): 6 e: (cm) 0.2 Wound Description Classification: Category/Stage III Foul Odor After C Wound Margin: Flat and Intact Slough/Fibrino Exudate Amount: Small Exudate Type: Serous Exudate Color:  amber Wound Bed Granulation Amount: Small (1-33%) Granulation Quality: Pink, Pale Fascia Exposed: Necrotic Amount: Large (67-100%) Fat Layer [place] Necrotic Quality: Adherent Slough Tendon Exposed: Muscle Expo Joint Expos Bone Expose leansing: No  Yes Exposed Structure No neous Tissue) Exposed: Yes No sed: No ed: No d: No Treatment Notes Wound #1 (Right Gluteus) 1. Cleanse With Wound Cleanser 2. Periwound Care Skin Prep 3. Primary Dressing Applied Iodoflex 4. Secondary Dressing Foam Border Dressing Electronic Signature(s) Signed: 08/19/2019 2:27:25 PM By: Mikeal Hawthorne EMT/HBOT Signed: 08/19/2019 5:35:23 PM By: Carlene Coria RN Entered By: Mikeal Hawthorne on 08/19/2019 13:27:25 -------------------------------------------------------------------------------- Vitals Details Patient Name: Date of Service: Hannah [name]. 08/18/2019 3:00 PM Medical Record [phone] Patient Account Number: [id number] Date of Birth/Sex: Treating RN: 02/28/1946 (74 y.o. Nancy Fetter Primary Care Makinzee Durley: [name] Other Clinician: Referring Mattison Stuckey: Treating Meesha Sek/Extender:Robson, Quincy Carnes, [place] Weeks in Treatment: 2 Vital Signs Time Taken: 15:34 Temperature (F): 98.5 Height (in): 64 Pulse (bpm): 57 Weight (lbs): 145 Respiratory Rate (breaths/min): 16 Body Mass Index (BMI): 24.9 Blood Pressure (mmHg): 116/74 Capillary Blood Glucose (mg/dl): 121 Reference Range: 80 - 120 mg / dl Notes glucose per pt report Electronic Signature(s) Signed: 08/24/2019 5:44:18 PM By: Levan Hurst RN, BSN Entered By: Levan Hurst on 08/18/2019 15:35:15

## 2019-08-24 NOTE — Telephone Encounter (Signed)
S/w pt went today to get kidney stent checked due to pt bleeding.  Dr. Noah Delaine from [facility] is keeping pt off of Eliquis till Thursday, pt did take Eliquis sat and sun.  Pt did have lab work today at [facility]  Pt see's Dr. Posey Pronto tomorrow.  Pt will send mychart message after Posey Pronto appointment to tell [name] what Dr. Posey Pronto did at upcoming visit.

## 2019-08-25 ENCOUNTER — Encounter (HOSPITAL_BASED_OUTPATIENT_CLINIC_OR_DEPARTMENT_OTHER): Payer: Medicare Other | Admitting: Internal Medicine

## 2019-08-25 ENCOUNTER — Other Ambulatory Visit: Payer: Self-pay

## 2019-08-25 DIAGNOSIS — L89313 Pressure ulcer of right buttock, stage 3: Secondary | ICD-10-CM | POA: Diagnosis not present

## 2019-08-25 NOTE — Progress Notes (Signed)
ILR Remote 

## 2019-08-26 NOTE — Progress Notes (Signed)
PEG, FIFER ([id number]) Visit Report for 08/25/2019 Debridement Details Patient Name: Date of Service: Hannah [name], Hannah [name] 08/25/2019 1:45 PM Medical Record [id number] Patient Account Number: [id number] Date of Birth/Sex: Treating RN: 1945-07-02 (74 y.o. Orvan Falconer Primary Care Provider: [name] Other Clinician: Referring Provider: Treating Provider/Extender:Shyler Hamill, Quincy Carnes, [place] Weeks in Treatment: 3 Debridement Performed for Wound #1 Right Gluteus Assessment: Performed By: Physician Ricard Dillon., MD Debridement Type: Debridement Level of Consciousness (Pre- Awake and Alert procedure): Pre-procedure Yes - 14:04 Verification/Time Out Taken: Start Time: 14:04 Pain Control: Lidocaine 5% topical ointment Total Area Debrided (L x W): 0.4 (cm) x 0.7 (cm) = 0.28 (cm) Tissue and other material Viable, Non-Viable, Slough, Subcutaneous, Skin: Dermis , Skin: Epidermis, Slough debrided: Level: Skin/Subcutaneous Tissue Debridement Description: Excisional Instrument: Curette Bleeding: Moderate Hemostasis Achieved: Pressure End Time: 14:07 Procedural Pain: 2 Post Procedural Pain: 0 Response to Treatment: Procedure was tolerated well Level of Consciousness Awake and Alert (Post-procedure): Post Debridement Measurements of Total Wound Length: (cm) 0.4 Stage: Category/Stage III Width: (cm) 0.7 Depth: (cm) 0.5 Volume: (cm) 0.11 Character of Wound/Ulcer Post Improved Debridement: Post Procedure Diagnosis Same as Pre-procedure Electronic Signature(s) Signed: 08/25/2019 5:29:05 PM By: Linton Ham MD Signed: 08/26/2019 8:57:53 AM By: Carlene Coria RN Entered By: Linton Ham on 08/25/2019 14:28:14 -------------------------------------------------------------------------------- HPI Details Patient Name: Date of Service: Hannah [name]. 08/25/2019 1:45 PM Medical Record [id number] Patient Account Number: [id number] Date of  Birth/Sex: Treating RN: 06/17/1945 (74 y.o. Orvan Falconer Primary Care Provider: [name] Other Clinician: Referring Provider: Treating Provider/Extender:Shayli Altemose, Quincy Carnes, Marcelle Smiling in Treatment: 3 History of Present Illness HPI Description: ADMISSION 08/04/2019 This is a pleasant 74 year old woman who is accompanied by her husband. She has had a lot of ongoing medical issues recently some of which are related to right breast cancer status post right lumpectomy and chemotherapy. She also had a right pleural effusion and had a wound VAC placed but is now status post pleurodesis on 06/26/2019. She also has had iron deficiency anemia I think of uncertain etiology but she has been receiving IV iron at hematology. About a month ago she noted pain on her upper right buttock. She had her husband finally have a look at this and she had a pressure ulcer. She went to see her primary doctor who told her to keep this covered and dry. Past medical history; type 2 diabetes, coronary artery disease status post CABG in 1998 with a redo in 2009 she has had a bioprosthetic prosthetic aortic valve replacement, paroxysmal atrial fibrillation, diastolic heart failure, right breast cancer status post lumpectomy and chemotherapy, residual seroma in the right breast, right pleural effusion status post drain and pleurodesis, iron deficiency anemia, hypertension and orthostatic hypotension, asthma, nephrolithiasis 3/2; the patient readmitted to the clinic last week. She has a small but punched out wound in the upper right buttock and close proximity to the gluteal cleft. We use silver collagen. She has home health and she has a daughter-in-law who is an LPN is changing this as well every second day. 3/9; the patient was admitted to the clinic 2 weeks ago. She had a small pressure ulcer on the upper right buttock. We have been using silver collagen. Unfortunately she comes back in with very adherent  debris over the surface of the wound 3/16; small wound on the right buttock. Absolutely no better. Changed to Iodoflex last time still the same adherent debris Electronic Signature(s) Signed: 08/25/2019 5:29:05 PM By: Linton Ham MD Entered By:  Linton Ham on 08/25/2019 14:28:49 -------------------------------------------------------------------------------- Physical Exam Details Patient Name: Date of Service: Hannah [name], Hannah [name] 08/25/2019 1:45 PM Medical Record [id number] Patient Account Number: [id number] Date of Birth/Sex: Treating RN: August 07, 1945 (74 y.o. Orvan Falconer Primary Care Provider: [name] Other Clinician: Referring Provider: Treating Provider/Extender:Selicia Windom, Quincy Carnes, [place] Weeks in Treatment: 3 Constitutional Sitting or standing Blood Pressure is within target range for patient.. Pulse regular and within target range for patient.Marland Kitchen Respirations regular, non-labored and within target range.. Temperature is normal and within the target range for the patient.Marland Kitchen Appears in no distress. Notes Wound exam; small wound in the right upper buttock same nonviable surface. Again another difficult debridement with a #3 curette I am not able to even get close to cleaning this off because of bleeding and discomfort. There is no evidence of surrounding infection however Electronic Signature(s) Signed: 08/25/2019 5:29:05 PM By: Linton Ham MD Entered By: Linton Ham on 08/25/2019 14:31:13 -------------------------------------------------------------------------------- Physician Orders Details Patient Name: Date of Service: Hannah [name]. 08/25/2019 1:45 PM Medical Record [id number] Patient Account Number: [id number] Date of Birth/Sex: Treating RN: [birth year] (74 y.o. Orvan Falconer Primary Care Provider: [name] Other Clinician: Referring Provider: Treating Provider/Extender:Jenella Craigie, Quincy Carnes, Marcelle Smiling in Treatment:  3 Verbal / Phone Orders: No Diagnosis Coding ICD-10 Coding Code Description L89.313 Pressure ulcer of right buttock, stage 3 C50.211 Malignant neoplasm of upper-inner quadrant of right female breast Follow-up Appointments Return Appointment in 1 week. Dressing Change Frequency Change Dressing every other day. Wound Cleansing Wound #1 Right Gluteus Clean wound with Normal Saline. Primary Wound Dressing Iodoflex Secondary Dressing Foam Border Electronic Signature(s) Signed: 08/25/2019 5:29:05 PM By: Linton Ham MD Signed: 08/26/2019 8:57:53 AM By: Carlene Coria RN Entered By: Carlene Coria on 08/25/2019 13:55:41 -------------------------------------------------------------------------------- Problem List Details Patient Name: Date of Service: Hannah [name]. 08/25/2019 1:45 PM Medical Record [id number] Patient Account Number: [id number] Date of Birth/Sex: Treating RN: 12/05/1945 (74 y.o. Orvan Falconer Primary Care Provider: [name] Other Clinician: Referring Provider: Treating Provider/Extender:Gita Dilger, Quincy Carnes, Marcelle Smiling in Treatment: 3 Active Problems ICD-10 Evaluated Encounter Code Description Active Date Today Diagnosis L89.313 Pressure ulcer of right buttock, stage 3 08/04/2019 No Yes C50.211 Malignant neoplasm of upper-inner quadrant of right 08/04/2019 No Yes female breast Inactive Problems Resolved Problems Electronic Signature(s) Signed: 08/25/2019 5:29:05 PM By: Linton Ham MD Entered By: Linton Ham on 08/25/2019 14:27:58 -------------------------------------------------------------------------------- Progress Note Details Patient Name: Date of Service: Hannah [name]. 08/25/2019 1:45 PM Medical Record [id number] Patient Account Number: [id number] Date of Birth/Sex: Treating RN: 03/26/1946 (74 y.o. Orvan Falconer Primary Care Provider: [name] Other Clinician: Referring Provider: Treating  Provider/Extender:Benjermin Korber, Quincy Carnes, Marcelle Smiling in Treatment: 3 Subjective History of Present Illness (HPI) ADMISSION 08/04/2019 This is a pleasant 74 year old woman who is accompanied by her husband. She has had a lot of ongoing medical issues recently some of which are related to right breast cancer status post right lumpectomy and chemotherapy. She also had a right pleural effusion and had a wound VAC placed but is now status post pleurodesis on 06/26/2019. She also has had iron deficiency anemia I think of uncertain etiology but she has been receiving IV iron at hematology. About a month ago she noted pain on her upper right buttock. She had her husband finally have a look at this and she had a pressure ulcer. She went to see her primary doctor who told her to keep this covered and dry. Past medical history; type 2 diabetes, coronary artery disease  status post CABG in 1998 with a redo in 2009 she has had a bioprosthetic prosthetic aortic valve replacement, paroxysmal atrial fibrillation, diastolic heart failure, right breast cancer status post lumpectomy and chemotherapy, residual seroma in the right breast, right pleural effusion status post drain and pleurodesis, iron deficiency anemia, hypertension and orthostatic hypotension, asthma, nephrolithiasis 3/2; the patient readmitted to the clinic last week. She has a small but punched out wound in the upper right buttock and close proximity to the gluteal cleft. We use silver collagen. She has home health and she has a daughter-in-law who is an LPN is changing this as well every second day. 3/9; the patient was admitted to the clinic 2 weeks ago. She had a small pressure ulcer on the upper right buttock. We have been using silver collagen. Unfortunately she comes back in with very adherent debris over the surface of the wound 3/16; small wound on the right buttock. Absolutely no better. Changed to Iodoflex last time still the same  adherent debris Objective Constitutional Sitting or standing Blood Pressure is within target range for patient.. Pulse regular and within target range for patient.Marland Kitchen Respirations regular, non-labored and within target range.. Temperature is normal and within the target range for the patient.Marland Kitchen Appears in no distress. Vitals Time Taken: 1:45 PM, Height: 64 in, Weight: 145 lbs, BMI: 24.9, Temperature: 98.4 F, Pulse: 63 bpm, Respiratory Rate: 16 breaths/min, Blood Pressure: 129/55 mmHg, Capillary Blood Glucose: 97 mg/dl. General Notes: glucose per pt report General Notes: Wound exam; small wound in the right upper buttock same nonviable surface. Again another difficult debridement with a #3 curette I am not able to even get close to cleaning this off because of bleeding and discomfort. There is no evidence of surrounding infection however Integumentary (Hair, Skin) Wound #1 status is Open. Original cause of wound was Pressure Injury. The wound is located on the Right Gluteus. The wound measures 0.4cm length x 0.7cm width x 0.5cm depth; 0.22cm^2 area and 0.11cm^3 volume. There is Fat Layer (Subcutaneous Tissue) Exposed exposed. There is no tunneling noted, however, there is undermining starting at 3:00 and ending at 6:00 with a maximum distance of 0.2cm. There is a medium amount of serous drainage noted. The wound margin is flat and intact. There is small (1-33%) pink, pale granulation within the wound bed. There is a large (67-100%) amount of necrotic tissue within the wound bed including Adherent Slough. Assessment Active Problems ICD-10 Pressure ulcer of right buttock, stage 3 Malignant neoplasm of upper-inner quadrant of right female breast Procedures Wound #1 Pre-procedure diagnosis of Wound #1 is a Pressure Ulcer located on the Right Gluteus . There was a Excisional Skin/Subcutaneous Tissue Debridement with a total area of 0.28 sq cm performed by Ricard Dillon., MD. With the  following instrument(s): Curette to remove Viable and Non-Viable tissue/material. Material removed includes Subcutaneous Tissue, Slough, Skin: Dermis, and Skin: Epidermis after achieving pain control using Lidocaine 5% topical ointment. No specimens were taken. A time out was conducted at 14:04, prior to the start of the procedure. A Moderate amount of bleeding was controlled with Pressure. The procedure was tolerated well with a pain level of 2 throughout and a pain level of 0 following the procedure. Post Debridement Measurements: 0.4cm length x 0.7cm width x 0.5cm depth; 0.11cm^3 volume. Post debridement Stage noted as Category/Stage III. Character of Wound/Ulcer Post Debridement is improved. Post procedure Diagnosis Wound #1: Same as Pre-Procedure Plan Follow-up Appointments: Return Appointment in 1 week. Dressing Change Frequency: Change  Dressing every other day. Wound Cleansing: Wound #1 Right Gluteus: Clean wound with Normal Saline. Primary Wound Dressing: Iodoflex Secondary Dressing: Foam Border 1. Still the Iodoflex 2. Really not winning a battle of a correct surface to close the small area. There is no evidence of infection Electronic Signature(s) Signed: 08/25/2019 5:29:05 PM By: Linton Ham MD Entered By: Linton Ham on 08/25/2019 14:31:51 -------------------------------------------------------------------------------- SuperBill Details Patient Name: Date of Service: Hannah [name] 08/25/2019 Medical Record [phone] Patient Account Number: [id number] Date of Birth/Sex: Treating RN: 20-Feb-1946 (74 y.o. Orvan Falconer Primary Care Provider: [name] Other Clinician: Referring Provider: Treating Provider/Extender:Pieper Kasik, Quincy Carnes, [place] Weeks in Treatment: 3 Diagnosis Coding ICD-10 Codes Code Description U23.536 Pressure ulcer of right buttock, stage 3 C50.211 Malignant neoplasm of upper-inner quadrant of right female breast Facility  Procedures CPT4 Code: [id number] Description: 0867 - DEB SUBQ TISSUE 20 SQ CM/< ICD-10 Diagnosis Description L89.313 Pressure ulcer of right buttock, stage 3 Modifier: Quantity: 1 Physician Procedures CPT4 Code: [id number] Description: 26712 - WC PHYS SUBQ TISS 20 SQ CM ICD-10 Diagnosis Description L89.313 Pressure ulcer of right buttock, stage 3 Modifier: Quantity: 1 Electronic Signature(s) Signed: 08/25/2019 5:29:05 PM By: Linton Ham MD Entered By: Linton Ham on 08/25/2019 14:32:03

## 2019-08-27 ENCOUNTER — Other Ambulatory Visit: Payer: Self-pay

## 2019-08-27 ENCOUNTER — Other Ambulatory Visit: Payer: Medicare Other

## 2019-08-27 ENCOUNTER — Inpatient Hospital Stay: Payer: Medicare Other

## 2019-08-27 ENCOUNTER — Ambulatory Visit: Payer: Medicare Other

## 2019-08-27 ENCOUNTER — Other Ambulatory Visit: Payer: Self-pay | Admitting: Emergency Medicine

## 2019-08-27 VITALS — BP 130/62 | Temp 98.2°F | Resp 18

## 2019-08-27 DIAGNOSIS — I129 Hypertensive chronic kidney disease with stage 1 through stage 4 chronic kidney disease, or unspecified chronic kidney disease: Secondary | ICD-10-CM | POA: Diagnosis not present

## 2019-08-27 DIAGNOSIS — D638 Anemia in other chronic diseases classified elsewhere: Secondary | ICD-10-CM

## 2019-08-27 LAB — CBC WITH DIFFERENTIAL (CANCER CENTER ONLY)
Abs Immature Granulocytes: 0.01 10*3/uL (ref 0.00–0.07)
Basophils Absolute: 0.1 10*3/uL (ref 0.0–0.1)
Basophils Relative: 1 %
Eosinophils Absolute: 0.3 10*3/uL (ref 0.0–0.5)
Eosinophils Relative: 5 %
HCT: 33.3 % — ABNORMAL LOW (ref 36.0–46.0)
Hemoglobin: 9.4 g/dL — ABNORMAL LOW (ref 12.0–15.0)
Immature Granulocytes: 0 %
Lymphocytes Relative: 10 %
Lymphs Abs: 0.6 10*3/uL — ABNORMAL LOW (ref 0.7–4.0)
MCH: 28.2 pg (ref 26.0–34.0)
MCHC: 28.2 g/dL — ABNORMAL LOW (ref 30.0–36.0)
MCV: 100 fL (ref 80.0–100.0)
Monocytes Absolute: 0.7 10*3/uL (ref 0.1–1.0)
Monocytes Relative: 12 %
Neutro Abs: 4 10*3/uL (ref 1.7–7.7)
Neutrophils Relative %: 72 %
Platelet Count: 153 10*3/uL (ref 150–400)
RBC: 3.33 MIL/uL — ABNORMAL LOW (ref 3.87–5.11)
RDW: 14.5 % (ref 11.5–15.5)
WBC Count: 5.7 10*3/uL (ref 4.0–10.5)
nRBC: 0 % (ref 0.0–0.2)

## 2019-08-27 LAB — SAMPLE TO BLOOD BANK

## 2019-08-27 MED ORDER — EPOETIN ALFA-EPBX 40000 UNIT/ML IJ SOLN
40000.0000 [IU] | Freq: Once | INTRAMUSCULAR | Status: AC
  Administered 2019-08-27: 40000 [IU] via SUBCUTANEOUS

## 2019-08-27 MED ORDER — EPOETIN ALFA-EPBX 40000 UNIT/ML IJ SOLN
INTRAMUSCULAR | Status: AC
  Filled 2019-08-27: qty 1

## 2019-08-27 NOTE — Patient Instructions (Signed)

## 2019-08-31 ENCOUNTER — Encounter (HOSPITAL_BASED_OUTPATIENT_CLINIC_OR_DEPARTMENT_OTHER): Payer: Medicare Other | Admitting: Internal Medicine

## 2019-08-31 NOTE — Progress Notes (Signed)
NAO, LINZ ([id number]) Visit Report for 08/25/2019 Arrival Information Details Patient Name: Date of Service: Hannah [name], Hannah [name] 08/25/2019 1:45 PM Medical Record [id number] Patient Account Number: [id number] Date of Birth/Sex: Treating RN: August 03, 1945 (74 y.o. Nancy Fetter Primary Care Rhydian Baldi: [name] Other Clinician: Referring Kattie Santoyo: Treating Liticia Gasior/Extender:Robson, Quincy Carnes, [place] Weeks in Treatment: 3 Visit Information History Since Last Visit Added or deleted any medications: No Patient Arrived: Ambulatory Any new allergies or adverse reactions: No Arrival Time: 13:43 Had a fall or experienced change in No Accompanied By: husband activities of daily living that may affect Transfer Assistance: None risk of falls: Patient Identification Verified: Yes Signs or symptoms of abuse/neglect since last No Secondary Verification Process Completed: Yes visito Patient Requires Transmission-Based No Hospitalized since last visit: No Precautions: Implantable device outside of the clinic excluding No Patient Has Alerts: No cellular tissue based products placed in the center since last visit: Has Dressing in Place as Prescribed: Yes Pain Present Now: No Electronic Signature(s) Signed: 08/31/2019 5:18:21 PM By: Levan Hurst RN, BSN Entered By: Levan Hurst on 08/25/2019 13:53:59 -------------------------------------------------------------------------------- Encounter Discharge Information Details Patient Name: Date of Service: Hannah [name]. 08/25/2019 1:45 PM Medical Record [id number] Patient Account Number: [id number] Date of Birth/Sex: Treating RN: 02/07/1946 (74 y.o. Clearnce Sorrel Primary Care Neli Fofana: [name] Other Clinician: Referring Cristen Bredeson: Treating Ambrie Carte/Extender:Robson, Quincy Carnes, Marcelle Smiling in Treatment: 3 Encounter Discharge Information Items Post Procedure Vitals Discharge Condition:  Stable Temperature (F): 98.4 Ambulatory Status: Ambulatory Pulse (bpm): 63 Discharge Destination: Home Respiratory Rate (breaths/min): 16 Transportation: Private Auto Blood Pressure (mmHg): 129/55 Accompanied By: husband Schedule Follow-up Appointment: Yes Clinical Summary of Care: Patient Declined Electronic Signature(s) Signed: 08/25/2019 5:07:08 PM By: Kela Millin Entered By: Kela Millin on 08/25/2019 14:18:40 -------------------------------------------------------------------------------- Multi Wound Chart Details Patient Name: Date of Service: Hannah [name]. 08/25/2019 1:45 PM Medical Record [id number] Patient Account Number: [id number] Date of Birth/Sex: Treating RN: 02-23-1946 (74 y.o. Orvan Falconer Primary Care Rashawn Rayman: [name] Other Clinician: Referring Bethaney Oshana: Treating Tanji Storrs/Extender:Robson, Quincy Carnes, [place] Weeks in Treatment: 3 Vital Signs Height(in): 64 Capillary Blood 97 Glucose(mg/dl): Weight(lbs): 145 Pulse(bpm): 63 Body Mass Index(BMI): 25 Blood Pressure(mmHg): 129/55 Temperature(F): 98.4 Respiratory 16 Rate(breaths/min): Photos: [1:No Photos] [N/A:N/A] Wound Location: [1:Right Gluteus] [N/A:N/A] Wounding Event: [1:Pressure Injury] [N/A:N/A] Primary Etiology: [1:Pressure Ulcer] [N/A:N/A] Comorbid History: [1:Cataracts, Anemia, Asthma, N/A Arrhythmia, Congestive Heart Failure, Coronary Artery Disease, Hypertension, Type II Diabetes, Osteoarthritis, Neuropathy, Received Chemotherapy, Received Radiation, Confinement Anxiety] Date Acquired: [1:07/04/2019] [N/A:N/A] Weeks of Treatment: [1:3] [N/A:N/A] Wound Status: [1:Open] [N/A:N/A] Measurements L x W x D 0.4x0.7x0.5 [N/A:N/A] (cm) Area (cm) : [1:0.22] [N/A:N/A] Volume (cm) : [1:0.11] [N/A:N/A] % Reduction in Area: [1:53.30%] [N/A:N/A] % Reduction in Volume: -17.00% [N/A:N/A] Starting Position 1 3 (o'clock): Ending Position 1 [1:6] (o'clock): Maximum  Distance 1 [1:0.2] (cm): Undermining: [1:Yes] [N/A:N/A N/A] Classification: [1:Category/Stage III] [N/A:N/A N/A] Exudate Amount: [1:Medium] [N/A:N/A N/A] Exudate Type: [1:Serous] [N/A:N/A N/A] Exudate Color: [1:amber] [N/A:N/A N/A] Wound Margin: [1:Flat and Intact] [N/A:N/A N/A] Granulation Amount: [1:Small (1-33%)] [N/A:N/A N/A] Granulation Quality: [1:Pink, Pale] [N/A:N/A N/A] Necrotic Amount: [1:Large (67-100%)] [N/A:N/A N/A] Exposed Structures: [1:Fat Layer (Subcutaneous Tissue) Exposed: Yes Fascia: No Tendon: No Muscle: No Joint: No Bone: No] [N/A:N/A N/A] Epithelialization: [1:Small (1-33%)] [N/A:N/A N/A] Debridement: [1:Debridement - Excisional] [N/A:N/A N/A] Pre-procedure [1:14:04] [N/A:N/A N/A] Verification/Time Out Taken: Pain Control: [1:Lidocaine 5% topical ointment] [N/A:N/A N/A] Tissue Debrided: [1:Subcutaneous, Slough] [N/A:N/A N/A] Level: [1:Skin/Subcutaneous Tissue] [N/A:N/A N/A] Debridement Area (sq cm):0.28 [N/A:N/A N/A] Instrument: [1:Curette] [N/A:N/A N/A] Bleeding: [1:Moderate] [  N/A:N/A N/A] Hemostasis Achieved: [1:Pressure] [N/A:N/A N/A] Procedural Pain: [1:2] [N/A:N/A N/A] Post Procedural Pain: [1:0] [N/A:N/A N/A] Debridement Treatment Procedure was tolerated [N/A:N/A N/A] Response: [1:well] Post Debridement [1:0.4x0.7x0.5] [N/A:N/A N/A] Measurements L x W x D (cm) Post Debridement [1:0.11] [N/A:N/A N/A] Volume: (cm) Post Debridement Stage: Category/Stage III [N/A:N/A N/A N/A N/A] Treatment Notes Wound #1 (Right Gluteus) 1. Cleanse With Wound Cleanser 2. Periwound Care Skin Prep 3. Primary Dressing Applied Iodoflex 4. Secondary Dressing Dry Gauze Foam Border Dressing Electronic Signature(s) Signed: 08/25/2019 5:29:05 PM By: Linton Ham MD Signed: 08/26/2019 8:57:53 AM By: Carlene Coria RN Entered By: Linton Ham on 08/25/2019 14:28:05 -------------------------------------------------------------------------------- Multi-Disciplinary  Care Plan Details Patient Name: Date of Service: Hannah [name]. 08/25/2019 1:45 PM Medical Record [id number] Patient Account Number: [id number] Date of Birth/Sex: Treating RN: Oct 12, 1945 (74 y.o. Orvan Falconer Primary Care Otto Caraway: [name] Other Clinician: Referring Nalini Alcaraz: Treating Kevion Fatheree/Extender:Robson, Quincy Carnes, [place] Weeks in Treatment: 3 Active Inactive Wound/Skin Impairment Nursing Diagnoses: Knowledge deficit related to ulceration/compromised skin integrity Goals: Ulcer/skin breakdown will have a volume reduction of 30% by week 4 Date Initiated: 08/04/2019 Target Resolution Date: 09/04/2019 Goal Status: Active Ulcer/skin breakdown will have a volume reduction of 50% by week 8 Date Initiated: 08/04/2019 Target Resolution Date: 09/04/2019 Goal Status: Active Interventions: Assess patient/caregiver ability to obtain necessary supplies Assess patient/caregiver ability to perform ulcer/skin care regimen upon admission and as needed Assess ulceration(s) every visit Notes: Electronic Signature(s) Signed: 08/26/2019 8:57:53 AM By: Carlene Coria RN Entered By: Carlene Coria on 08/25/2019 13:56:04 -------------------------------------------------------------------------------- Pain Assessment Details Patient Name: Date of Service: LAYLANI, PUDWILL 08/25/2019 1:45 PM Medical Record [id number] Patient Account Number: [id number] Date of Birth/Sex: Treating RN: [birth year] (74 y.o. Nancy Fetter Primary Care Haley Roza: [name] Other Clinician: Referring Waniya Hoglund: Treating Tydus Sanmiguel/Extender:Robson, Quincy Carnes, [place] Weeks in Treatment: 3 Active Problems Location of Pain Severity and Description of Pain Patient Has Paino No Site Locations Pain Management and Medication Current Pain Management: Electronic Signature(s) Signed: 08/31/2019 5:18:21 PM By: Levan Hurst RN, BSN Entered By: Levan Hurst on 08/25/2019  13:54:29 -------------------------------------------------------------------------------- Patient/Caregiver Education Details Patient Name: Date of Service: Hannah [name] 3/16/2021andnbsp1:45 PM Medical Record [id number] Patient Account Number: [id number] Date of Birth/Gender: Treating RN: 1946-03-24 (73 y.o. Orvan Falconer Primary Care Physician: [name] Other Clinician: Referring Physician: Treating Physician/Extender:Robson, Quincy Carnes, Marcelle Smiling in Treatment: 3 Education Assessment Education Provided To: Patient Education Topics Provided Wound/Skin Impairment: Methods: Explain/Verbal Responses: State content correctly Electronic Signature(s) Signed: 08/26/2019 8:57:53 AM By: Carlene Coria RN Entered By: Carlene Coria on 08/25/2019 13:56:18 -------------------------------------------------------------------------------- Wound Assessment Details Patient Name: Date of Service: SYANN, CUPPLES 08/25/2019 1:45 PM Medical Record [id number] Patient Account Number: [id number] Date of Birth/Sex: Treating RN: July 26, 1945 (74 y.o. Orvan Falconer Primary Care Glorious Flicker: [name] Other Clinician: Referring Taima Rada: Treating Bunnie Lederman/Extender:Robson, Quincy Carnes, [place] Weeks in Treatment: 3 Wound Status Wound Number: 1 Primary Pressure Ulcer Etiology: Wound Location: Right Gluteus Wound Open Wounding Event: Pressure Injury Status: Date Acquired: 07/04/2019 Comorbid Cataracts, Anemia, Asthma, Arrhythmia, Weeks Of Treatment: 3 History: Congestive Heart Failure, Coronary Artery Clustered Wound: No Disease, Hypertension, Type II Diabetes, Osteoarthritis, Neuropathy, Received Chemotherapy, Received Radiation, Confinement Anxiety Photos Wound Measurements Length: (cm) 0.4 Width: (cm) 0.7 Depth: (cm) 0.5 Area: (cm) 0.22 Volume: (cm) 0.11 % Reduction in Area: 53.3% % Reduction in Volume: -17% Epithelialization: Small (1-33%) Tunneling:  No Undermining: Yes Starting Position (o'clock): 3 Ending Position (o'clock): 6 Maximum Distance: (cm) 0.2 Wound Description Classification: Category/Stage III Foul Odor  Wound Margin: Flat and Intact Slough/Fib Exudate Amount: Medium Exudate Type: Serous Exudate Color: amber Wound Bed Granulation Amount: Small (1-33%) Granulation Quality: Pink, Pale Fascia Expo Necrotic Amount: Large (67-100%) Fat Layer ( Necrotic Quality: Adherent Slough Tendon Expo Muscle Expo Joint Expos Bone Expose After Cleansing: No rino Yes Exposed Structure sed: No Subcutaneous Tissue) Exposed: Yes sed: No sed: No ed: No d: No Treatment Notes Wound #1 (Right Gluteus) 1. Cleanse With Wound Cleanser 2. Periwound Care Skin Prep 3. Primary Dressing Applied Iodoflex 4. Secondary Dressing Dry Gauze Foam Border Dressing Electronic Signature(s) Signed: 08/26/2019 4:08:06 PM By: Mikeal Hawthorne EMT/HBOT Signed: 08/28/2019 5:08:14 PM By: Carlene Coria RN Entered By: Mikeal Hawthorne on 08/26/2019 14:14:05 -------------------------------------------------------------------------------- Vitals Details Patient Name: Date of Service: Hannah [name]. 08/25/2019 1:45 PM Medical Record [phone] Patient Account Number: [id number] Date of Birth/Sex: Treating RN: [birth year] (74 y.o. Nancy Fetter Primary Care Cordaro Mukai: [name] Other Clinician: Referring Sheryl Saintil: Treating Kathyjo Briere/Extender:Robson, Quincy Carnes, [place] Weeks in Treatment: 3 Vital Signs Time Taken: 13:45 Temperature (F): 98.4 Height (in): 64 Pulse (bpm): 63 Weight (lbs): 145 Respiratory Rate (breaths/min): 16 Body Mass Index (BMI): 24.9 Blood Pressure (mmHg): 129/55 Capillary Blood Glucose (mg/dl): 97 Reference Range: 80 - 120 mg / dl Notes glucose per pt report Electronic Signature(s) Signed: 08/31/2019 5:18:21 PM By: Levan Hurst RN, BSN Entered By: Levan Hurst on 08/25/2019 13:54:24

## 2019-09-01 ENCOUNTER — Encounter (HOSPITAL_BASED_OUTPATIENT_CLINIC_OR_DEPARTMENT_OTHER): Payer: Medicare Other | Admitting: Internal Medicine

## 2019-09-01 ENCOUNTER — Telehealth: Payer: Self-pay | Admitting: *Deleted

## 2019-09-01 ENCOUNTER — Other Ambulatory Visit: Payer: Self-pay

## 2019-09-01 DIAGNOSIS — L89313 Pressure ulcer of right buttock, stage 3: Secondary | ICD-10-CM | POA: Diagnosis not present

## 2019-09-01 NOTE — Telephone Encounter (Signed)
S/w Lattie Haw at Dr. Serita Grit office, [place] [facility] @ [phone] and will fax labs to Leisa Lenz, NP.

## 2019-09-01 NOTE — Progress Notes (Signed)
ANNORA, GUDERIAN ([id number]) Visit Report for 09/01/2019 Debridement Details Patient Name: Date of Service: KADEE, PHILYAW 09/01/2019 4:00 PM Medical Record [id number] Patient Account Number: [id number] Date of Birth/Sex: Treating RN: [birth year] (74 y.o. Orvan Falconer Primary Care Provider: [name] Other Clinician: Referring Provider: Treating Provider/Extender:Usman Millett, Quincy Carnes, Marcelle Smiling in Treatment: 4 Debridement Performed for Wound #1 Right Gluteus Assessment: Performed By: Physician Ricard Dillon., MD Debridement Type: Debridement Level of Consciousness (Pre- Awake and Alert procedure): Pre-procedure Yes - 17:16 Verification/Time Out Taken: Start Time: 17:16 Pain Control: Lidocaine 5% topical ointment Total Area Debrided (L x W): 0.5 (cm) x 0.5 (cm) = 0.25 (cm) Tissue and other material Viable, Non-Viable, Slough, Subcutaneous, Skin: Dermis , Skin: Epidermis, Slough debrided: Level: Skin/Subcutaneous Tissue Debridement Description: Excisional Instrument: Curette Bleeding: Minimum Hemostasis Achieved: Pressure End Time: 17:18 Procedural Pain: 0 Post Procedural Pain: 0 Response to Treatment: Procedure was tolerated well Level of Consciousness Awake and Alert (Post-procedure): Post Debridement Measurements of Total Wound Length: (cm) 0.5 Stage: Category/Stage III Width: (cm) 0.5 Depth: (cm) 0.3 Volume: (cm) 0.059 Character of Wound/Ulcer Post Improved Debridement: Post Procedure Diagnosis Same as Pre-procedure Electronic Signature(s) Signed: 09/01/2019 5:53:09 PM By: Linton Ham MD Signed: 09/01/2019 5:59:23 PM By: Carlene Coria RN Entered By: Linton Ham on 09/01/2019 17:40:27 -------------------------------------------------------------------------------- HPI Details Patient Name: Date of Service: [name] 09/01/2019 4:00 PM Medical Record [id number] Patient Account Number: [id number] Date of  Birth/Sex: Treating RN: 02-Apr-1946 (74 y.o. Orvan Falconer Primary Care Provider: [name] Other Clinician: Referring Provider: Treating Provider/Extender:Dorin Stooksbury, Quincy Carnes, Marcelle Smiling in Treatment: 4 History of Present Illness HPI Description: ADMISSION 08/04/2019 This is a pleasant 74 year old woman who is accompanied by her husband. She has had a lot of ongoing medical issues recently some of which are related to right breast cancer status post right lumpectomy and chemotherapy. She also had a right pleural effusion and had a wound VAC placed but is now status post pleurodesis on 06/26/2019. She also has had iron deficiency anemia I think of uncertain etiology but she has been receiving IV iron at hematology. About a month ago she noted pain on her upper right buttock. She had her husband finally have a look at this and she had a pressure ulcer. She went to see her primary doctor who told her to keep this covered and dry. Past medical history; type 2 diabetes, coronary artery disease status post CABG in 1998 with a redo in 2009 she has had a bioprosthetic prosthetic aortic valve replacement, paroxysmal atrial fibrillation, diastolic heart failure, right breast cancer status post lumpectomy and chemotherapy, residual seroma in the right breast, right pleural effusion status post drain and pleurodesis, iron deficiency anemia, hypertension and orthostatic hypotension, asthma, nephrolithiasis 3/2; the patient readmitted to the clinic last week. She has a small but punched out wound in the upper right buttock and close proximity to the gluteal cleft. We use silver collagen. She has home health and she has a daughter-in-law who is an LPN is changing this as well every second day. 3/9; the patient was admitted to the clinic 2 weeks ago. She had a small pressure ulcer on the upper right buttock. We have been using silver collagen. Unfortunately she comes back in with very adherent  debris over the surface of the wound 3/16; small wound on the right buttock. Absolutely no better. Changed to Iodoflex last time still the same adherent debris 3/23; small wound on the right buttock somewhat smaller but still with  an inherent fibrinous surface. We have been using Iodoflex. Electronic Signature(s) Signed: 09/01/2019 5:53:09 PM By: Linton Ham MD Entered By: Linton Ham on 09/01/2019 17:40:57 -------------------------------------------------------------------------------- Physical Exam Details Patient Name: Date of Service: [name] 09/01/2019 4:00 PM Medical Record [id number] Patient Account Number: [id number] Date of Birth/Sex: Treating RN: 13-Sep-1945 (74 y.o. Orvan Falconer Primary Care Provider: Other Clinician: [name] Referring Provider: Treating Provider/Extender:Lucella Pommier, Quincy Carnes, [place] Weeks in Treatment: 4 Constitutional Sitting or standing Blood Pressure is within target range for patient.. Pulse regular and within target range for patient.Marland Kitchen Respirations regular, non-labored and within target range.. Temperature is normal and within the target range for the patient.Marland Kitchen Appears in no distress. Notes Wound exam; slight improvement in depth. There is no surrounding infection no surrounding tenderness. Again another difficult debridement with a #3 curette related to tightly adherent fibrinous debris on the surface of the wound hemostasis with direct pressure. She does not tolerate this well Electronic Signature(s) Signed: 09/01/2019 5:53:09 PM By: Linton Ham MD Entered By: Linton Ham on 09/01/2019 17:41:42 -------------------------------------------------------------------------------- Physician Orders Details Patient Name: Date of Service: [name] 09/01/2019 4:00 PM Medical Record [id number] Patient Account Number: [id number] Date of Birth/Sex: Treating RN: 1946/02/09 (74 y.o. Orvan Falconer Primary  Care Provider: [name] Other Clinician: Referring Provider: Treating Provider/Extender:Rene Gonsoulin, Quincy Carnes, Marcelle Smiling in Treatment: 4 Verbal / Phone Orders: No Diagnosis Coding ICD-10 Coding Code Description L89.313 Pressure ulcer of right buttock, stage 3 C50.211 Malignant neoplasm of upper-inner quadrant of right female breast Follow-up Appointments Return Appointment in 2 weeks. Dressing Change Frequency Change Dressing every other day. Wound Cleansing Wound #1 Right Gluteus Clean wound with Normal Saline. Primary Wound Dressing Iodoflex Secondary Dressing Foam Border Electronic Signature(s) Signed: 09/01/2019 5:53:09 PM By: Linton Ham MD Signed: 09/01/2019 5:59:23 PM By: Carlene Coria RN Entered By: Carlene Coria on 09/01/2019 17:17:54 -------------------------------------------------------------------------------- Problem List Details Patient Name: Date of Service: [name] 09/01/2019 4:00 PM Medical Record [id number] Patient Account Number: [id number] Date of Birth/Sex: Treating RN: 1946-02-22 (74 y.o. Orvan Falconer Primary Care Provider: [name] Other Clinician: Referring Provider: Treating Provider/Extender:Raevin Wierenga, Quincy Carnes, Marcelle Smiling in Treatment: 4 Active Problems ICD-10 Evaluated Encounter Code Description Active Date Today Diagnosis L89.313 Pressure ulcer of right buttock, stage 3 08/04/2019 No Yes C50.211 Malignant neoplasm of upper-inner quadrant of right 08/04/2019 No Yes female breast Inactive Problems Resolved Problems Electronic Signature(s) Signed: 09/01/2019 5:53:09 PM By: Linton Ham MD Entered By: Linton Ham on 09/01/2019 17:34:11 -------------------------------------------------------------------------------- Progress Note Details Patient Name: Date of Service: [name] 09/01/2019 4:00 PM Medical Record [id number] Patient Account Number: [id number] Date of  Birth/Sex: Treating RN: 30-Oct-1945 (74 y.o. Orvan Falconer Primary Care Provider: [name] Other Clinician: Referring Provider: Treating Provider/Extender:Hadden Steig, Quincy Carnes, [place] Weeks in Treatment: 4 Subjective History of Present Illness (HPI) ADMISSION 08/04/2019 This is a pleasant 74 year old woman who is accompanied by her husband. She has had a lot of ongoing medical issues recently some of which are related to right breast cancer status post right lumpectomy and chemotherapy. She also had a right pleural effusion and had a wound VAC placed but is now status post pleurodesis on 06/26/2019. She also has had iron deficiency anemia I think of uncertain etiology but she has been receiving IV iron at hematology. About a month ago she noted pain on her upper right buttock. She had her husband finally have a look at this and she had a pressure ulcer. She went to see her  primary doctor who told her to keep this covered and dry. Past medical history; type 2 diabetes, coronary artery disease status post CABG in 1998 with a redo in 2009 she has had a bioprosthetic prosthetic aortic valve replacement, paroxysmal atrial fibrillation, diastolic heart failure, right breast cancer status post lumpectomy and chemotherapy, residual seroma in the right breast, right pleural effusion status post drain and pleurodesis, iron deficiency anemia, hypertension and orthostatic hypotension, asthma, nephrolithiasis 3/2; the patient readmitted to the clinic last week. She has a small but punched out wound in the upper right buttock and close proximity to the gluteal cleft. We use silver collagen. She has home health and she has a daughter-in-law who is an LPN is changing this as well every second day. 3/9; the patient was admitted to the clinic 2 weeks ago. She had a small pressure ulcer on the upper right buttock. We have been using silver collagen. Unfortunately she comes back in with very adherent  debris over the surface of the wound 3/16; small wound on the right buttock. Absolutely no better. Changed to Iodoflex last time still the same adherent debris 3/23; small wound on the right buttock somewhat smaller but still with an inherent fibrinous surface. We have been using Iodoflex. Objective Constitutional Sitting or standing Blood Pressure is within target range for patient.. Pulse regular and within target range for patient.Marland Kitchen Respirations regular, non-labored and within target range.. Temperature is normal and within the target range for the patient.Marland Kitchen Appears in no distress. Vitals Time Taken: 4:51 PM, Height: 64 in, Weight: 145 lbs, BMI: 24.9, Temperature: 98.5 F, Pulse: 55 bpm, Respiratory Rate: 16 breaths/min, Blood Pressure: 109/39 mmHg, Capillary Blood Glucose: 118 mg/dl. General Notes: glucose per pt report General Notes: Wound exam; slight improvement in depth. There is no surrounding infection no surrounding tenderness. Again another difficult debridement with a #3 curette related to tightly adherent fibrinous debris on the surface of the wound hemostasis with direct pressure. She does not tolerate this well Integumentary (Hair, Skin) Wound #1 status is Open. Original cause of wound was Pressure Injury. The wound is located on the Right Gluteus. The wound measures 0.5cm length x 0.5cm width x 0.3cm depth; 0.196cm^2 area and 0.059cm^3 volume. There is Fat Layer (Subcutaneous Tissue) Exposed exposed. There is no tunneling or undermining noted. There is a medium amount of serous drainage noted. The wound margin is flat and intact. There is small (1-33%) pink, pale granulation within the wound bed. There is a large (67-100%) amount of necrotic tissue within the wound bed including Adherent Slough. Assessment Active Problems ICD-10 Pressure ulcer of right buttock, stage 3 Malignant neoplasm of upper-inner quadrant of right female breast Procedures Wound #1 Pre-procedure  diagnosis of Wound #1 is a Pressure Ulcer located on the Right Gluteus . There was a Excisional Skin/Subcutaneous Tissue Debridement with a total area of 0.25 sq cm performed by Ricard Dillon., MD. With the following instrument(s): Curette to remove Viable and Non-Viable tissue/material. Material removed includes Subcutaneous Tissue, Slough, Skin: Dermis, and Skin: Epidermis after achieving pain control using Lidocaine 5% topical ointment. No specimens were taken. A time out was conducted at 17:16, prior to the start of the procedure. A Minimum amount of bleeding was controlled with Pressure. The procedure was tolerated well with a pain level of 0 throughout and a pain level of 0 following the procedure. Post Debridement Measurements: 0.5cm length x 0.5cm width x 0.3cm depth; 0.059cm^3 volume. Post debridement Stage noted as Category/Stage III. Character of  Wound/Ulcer Post Debridement is improved. Post procedure Diagnosis Wound #1: Same as Pre-Procedure Plan Follow-up Appointments: Return Appointment in 2 weeks. Dressing Change Frequency: Change Dressing every other day. Wound Cleansing: Wound #1 Right Gluteus: Clean wound with Normal Saline. Primary Wound Dressing: Iodoflex Secondary Dressing: Foam Border 1. Continue with the Iodoflex border foam 2. 2-week follow-up Electronic Signature(s) Signed: 09/01/2019 5:53:09 PM By: Linton Ham MD Entered By: Linton Ham on 09/01/2019 17:42:11 -------------------------------------------------------------------------------- SuperBill Details Patient Name: Date of Service: [name] 09/01/2019 Medical Record [id number] Patient Account Number: [id number] Date of Birth/Sex: Treating RN: 1945/09/12 (74 y.o. Orvan Falconer Primary Care Provider: [name] Other Clinician: Referring Provider: Treating Provider/Extender:Addie Cederberg, Quincy Carnes, [place] Weeks in Treatment: 4 Diagnosis Coding ICD-10 Codes Code  Description Q72.257 Pressure ulcer of right buttock, stage 3 C50.211 Malignant neoplasm of upper-inner quadrant of right female breast Facility Procedures CPT4 Code: [id number] 1 Description: 8251 - DEB SUBQ TISSUE 20 SQ CM/< ICD-10 Diagnosis Description L89.313 Pressure ulcer of right buttock, stage 3 Modifier: Quantity: 1 Physician Procedures CPT4 Code: [id number] Description: 31281 - WC PHYS SUBQ TISS 20 SQ CM ICD-10 Diagnosis Description L89.313 Pressure ulcer of right buttock, stage 3 Modifier: Quantity: 1 Electronic Signature(s) Signed: 09/01/2019 5:53:09 PM By: Linton Ham MD Entered By: Linton Ham on 09/01/2019 17:42:26

## 2019-09-02 ENCOUNTER — Telehealth: Payer: Self-pay | Admitting: *Deleted

## 2019-09-02 NOTE — Telephone Encounter (Signed)
Pt calling in today had stent removed due to kidney stones went home, would not urinate, went back to [facility]  and saw Janine Limbo, NP at [facility]. Drained 710 cc of urine from pt with blood clots.  Pt has to keep cathter in and was told to hold Eliquis starting today and will send mychart message when pt has to restart Eliquis. Pt stated HGB 8.6 and creatine was down 1/10th.  Will send to [facility] to [facility].

## 2019-09-02 NOTE — Progress Notes (Signed)
CARDIOLOGY OFFICE NOTE  Date:  09/09/2019    Hannah [name] Date of Birth: 05-Aug-1945 Medical Record #[id number]  PCP:  [name], DO  Cardiologist:  Atilano Median  Chief Complaint  Patient presents with  . Follow-up    History of Present Illness: Hannah [name] is a 74 y.o. female who presents today for a follow up visit. Seen for Dr. Lovena Le.Primarily follows with me.  She has a very complex/extensivehistory - this includes ahistory ofCAD s/p CABG (1998 with redo 2009- Dr. Cyndia Bent), bioprosthetic AVR in 9373, chronic diastolic CHF, asthma, carotid artery disease (1-39% by duplex 10/2017, due 2021), CKD stage IV(Dr. Posey Pronto), PAF- has been on Multaq in the past and now on amiodarone, atrial tachycardia, breast CA, orthostasis,chronicanemia, thrombocytopenia, mild dilation of ascending aorta by echo 10/2018, recurrent syncope/orthostasis,recurrentrightpleural effusion requiring thoracentesis4/2020and 05/2019, HTN, GERD,&HLD.   I have followed her for many years - she has had periodic issues with volume overload, orthostasis and tachy palpitations.Last cath in 2014 - grafts patent. Myoview in 2015 without ischemia.  Over the past few months she has had more issues with her pleural effusion - diuretics caused worsening CKD/orthostasis- resulting in injury. She has gotten more anemic(down to 6)- back to hematology and now on therapy. Concern for MDS andhas hadbone marrow biopsy - wasalso needing colonoscopy due to profound anemia (HGB down to 6 which is highly unusual for Hannah [name]).Most recent abdominal ultrasound noted and concerning for liver abnormality. Recent mammogram also recently noted and abnormal - ended up havingmultiplerepeat biopsies that were negative.  Most recent course has beenprettyeventful - she has hadto be placed back onlow dose diuretics forarecurring effusion.Pulmonary wished for me to dose her diuretics due to her fragile  nature with orthostasis/CKD.Progressive failure to thrive. Has been back to the hospital in the interim- treated for CHF and pneumonia. Remainsmarkedlyanemic - needed colonoscopy. Has had abnormal mammogram with 4subsequent negativebiopsies. At one visit in September - she was coming in here and got dizzy/fell in the parking loton the curband had significant laceration to the back of her her head. ILR interrogation was ok at that time. Have added back Midodrine for orthostasisto prevent injury.She has been slow to recoverand make progress. Has seen nephrology who thought her kidney function was stable. Ended up having CT in place of colonoscopy per GI.  She has had recurrent effusion - referred to Dr. Cyndia Bent and she had Pleurex cath placement with talc and subsequent removal with improvement. Cytologies on prior taps negative. We got her echo updated as well - EF remains normal.Last seen a month ago - seemed overall to be stable but had a kidney stone and needing lithotripsy - this has been complicated by worsening renal function/hematuria and overall weakness - to the point she has been transfused again.   The patient does not have symptoms concerning for COVID-19 infection (fever, chills, cough, or new shortness of breath).   Comes in today. Here with [name]. Doing a bit better. No more bleeding. Has restarted her Eliquis as of today. Catheter is out. Little short of breath - little swelling and her weight is up 2 pounds. She has not been on any Lasix for about 3 weeks - the time frame of her stone. She was transfused. BP is ok.   Past Medical History:  Diagnosis Date  . Abnormally small mouth   . Allergic rhinitis 10/14/2009   Qualifier: Diagnosis of  By: Lamonte Sakai MD, Rose Fillers   Overview:  Overview:  Qualifier:  Diagnosis of  By: Lamonte Sakai MD, Rose Fillers  Last Assessment & Plan:  Please continue Xyzal and Nasacort as you have been using them  . Anemia   . Asthma 05/12/2009   10/12/2014 p  extensive coaching HFA effectiveness =    75% s spacer    Overview:  Overview:  10/12/2014 p extensive coaching HFA effectiveness =    75% s spacer   Last Assessment & Plan:  Please continue Symbicort 2 puffs twice a day. Remember to rinse and gargle after taking this medication. Take albuterol 2 puffs up to every 4 hours if needed for shortness of breath.  Follow with Dr Lamonte Sakai in 6 month  . Bilateral carotid artery stenosis    a. mild 1-39% by duplex 10/2017, due 2021.  . Breast cancer ([place]) 01/18/2016   right breast  . CAD (coronary artery disease) 01/24/2011   a. s/p CABG 1998 with redo 2009.  Marland Kitchen Chemotherapy induced nausea and vomiting 02/24/2016  . Chemotherapy-induced peripheral neuropathy ([place]) 04/27/2016  . 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 Chronic diastolic CHF (congestive heart failure) ([place]) 09/24/2013  . CKD (chronic kidney disease), stage IV ([place]) 09/24/2013  . Closed fracture of head of left humerus 09/09/2017  . Complication of anesthesia   . Dysrhythmia    a-fib  . Gallstones   . GERD (gastroesophageal reflux disease)   . Glaucoma   . H/O atrial tachycardia 05/12/2009   Qualifier: History of  By: Lamonte Sakai MD, Rose Fillers   Overview:  Overview:  Qualifier: History of  By: Lamonte Sakai MD, Rose Fillers  Last Assessment & Plan:  There was no evidence of this on her cardiac monitor. I would recommend watchful waiting.   Marland Kitchen Heart murmur   . History of kidney stones   . History of non-ST elevation myocardial infarction (NSTEMI)    Sept 2014--  thought to be type II HTN w/ LHC without infarct related artery and patent grafts  . History of radiation therapy 05/24/16-07/26/16   right breast 50.4 Gy in 28 fractions, right breast boost 10 Gy in 5 fractions  . Hyperlipidemia   . Hypotension   . Iron deficiency anemia   . Mania ([place]) 03/05/2016  . Moderate persistent asthma    pulmologist-  Dr. Malvin Johns  . Osteomyelitis of toe of left foot ([place]) 04/06/2016  . Osteopenia of multiple sites  10/19/2015  . PAF (paroxysmal atrial fibrillation) ([place])   . Personal history of chemotherapy 2017  . Personal history of radiation therapy 2017  . PONV (postoperative nausea and vomiting)   . Port catheter in place 02/17/2016  . Psoriasis    right leg  . Renal calculus, right   . S/P AVR    prosthesis valve placement 2009 at same time re-do CABG  . Seizure-like activity ([place]) 09/01/2017  . Sensorineural hearing loss (SNHL) of both ears 01/06/2016  . Stroke [facility]) 2014   residual rt hearing loss  . Syncope 09/06/2017  . Type 2 diabetes mellitus ([place])    monitored by dr Dwyane Dee    Past Surgical History:  Procedure Laterality Date  . AORTIC VALVE REPLACEMENT  [facility]   [facility] same time is [facility]  . BREAST LUMPECTOMY Right 01/18/2016  . BREAST LUMPECTOMY WITH RADIOACTIVE SEED AND SENTINEL LYMPH NODE BIOPSY Right 01/18/2016   Procedure: RIGHT BREAST LUMPECTOMY WITH RADIOACTIVE SEED AND SENTINEL LYMPH NODE BIOPSY;  Surgeon: DAlphonsa Overall MD;  [place]  Service: General;  Laterality: Right;  .  CARDIAC CATHETERIZATION  03/23/2008   Pre-redo CABG: L main OK, LAD (T), CFX (T), OM1 99%, RCA (T), LIMA-LAD OK, SVG-OM(?3) OK w/ little florw to OM2, SVG-RCA OK. EF NL  . CARPAL TUNNEL RELEASE    . CHEST TUBE INSERTION Right 06/26/2019   Procedure: INSERTION PLEURAL DRAINAGE CATHETER;  Surgeon: Gaye Pollack, MD;  Location: [place];  Service: Thoracic;  Laterality: Right;  . CHOLECYSTECTOMY N/A 07/07/2018   Procedure: LAPAROSCOPIC CHOLECYSTECTOMY WITH INTRAOPERATIVE CHOLANGIOGRAM ERAS PATHWAY;  Surgeon: Alphonsa Overall, MD;  Location: [facility];  Service: General;  Laterality: N/A;  . COLONOSCOPY     around 2015. Possibly with [place]   . CORONARY ARTERY BYPASS GRAFT  1998 &  re-do 2009   Had LIMA to DX/LAD, SVG to 2 marginal branches and SVG to [facility] originally; SVG to 3rd OM and PD at time of redo  . CYSTOSCOPY W/ URETERAL STENT PLACEMENT Right 12/20/2014   Procedure:  CYSTOSCOPY WITH RETROGRADE PYELOGRAM/URETERAL STENT PLACEMENT;  Surgeon: Cleon Gustin, MD;  Location[facility];  Service: Urology;  Laterality: Right;  . CYSTOSCOPY WITH RETROGRADE PYELOGRAM, URETEROSCOPY AND STENT PLACEMENT Left 08/21/2019   Procedure: CYSTOSCOPY WITH RETROGRADE PYELOGRAM, URETEROSCOPY AND STENT PLACEMENT;  Surgeon: Cleon Gustin, MD;  Location: [facility];  Service: Urology;  Laterality: Left;  1 HR  . ESOPHAGOGASTRODUODENOSCOPY     many years ago per patient   . ESOPHAGOGASTRODUODENOSCOPY ENDOSCOPY  06/17/2018  . EYE SURGERY Bilateral    cataracts  . HOLMIUM LASER APPLICATION Right 12/05/9483   Procedure:  HOLMIUM LASER LITHOTRIPSY;  Surgeon: Cleon Gustin, MD;  Location: [facility];  Service: Urology;  Laterality: Right;  . HOLMIUM LASER APPLICATION Left 4/62/7035   Procedure: HOLMIUM LASER APPLICATION;  Surgeon: Cleon Gustin, MD;  Location: [facility];  Service: Urology;  Laterality: Left;  . IR THORACENTESIS ASP PLEURAL SPACE W/IMG GUIDE  10/03/2018  . IR THORACENTESIS ASP PLEURAL SPACE W/IMG GUIDE  06/08/2019  . LEFT HEART CATHETERIZATION WITH CORONARY/GRAFT ANGIOGRAM N/A 02/23/2013   Procedure: LEFT HEART CATHETERIZATION WITH Beatrix Fetters;  Surgeon: Blane Ohara, MD;  Location: [facility] CATH LAB;  Service: Cardiovascular;  Laterality: N/A;  . LOOP RECORDER INSERTION N/A 08/30/2017   Procedure: LOOP RECORDER INSERTION;  Surgeon: Evans Lance, MD;  Location: [place] CV LAB;  Service: Cardiovascular;  Laterality: N/A;  . PORTACATH PLACEMENT Left 01/18/2016   Procedure: INSERTION PORT-A-CATH;  Surgeon: Alphonsa Overall, MD;  Location: [place];  Service: General;  Laterality: Left;  . portacath removal    . REMOVAL OF PLEURAL DRAINAGE CATHETER Right 07/14/2019   Procedure: REMOVAL OF PLEURAL DRAINAGE CATHETER;  Surgeon: Gaye Pollack, MD;  Location: [place];  Service: Thoracic;  Laterality: Right;  . TALC PLEURODESIS N/A  06/26/2019   Procedure: Pietro Cassis;  Surgeon: Gaye Pollack, MD;  Location: [facility];  Service: Thoracic;  Laterality: N/A;  . TONSILLECTOMY    . TRANSTHORACIC ECHOCARDIOGRAM  02-24-2013      mild LVH,  ef 50-55%/  AV bioprosthesis was present with very mild stenosis and no regurg., mean grandient 20mHg, peak grandient 271mg /  mild MR/  mild LAE and RAE/  moderate TR  . TUBAL LIGATION       Medications: Current Meds  Medication Sig  . acetaminophen (TYLENOL) 325 MG tablet Take 2 tablets (650 mg total) by mouth every 6 (six) hours as needed for mild pain (or Fever >/= 101).  . Marland Kitchenlbuterol (PROVENTIL HFA;VENTOLIN  HFA) 108 (90 Base) MCG/ACT inhaler Inhale 2 puffs into the lungs every 6 (six) hours as needed for wheezing or shortness of breath.  Marland Kitchen amiodarone (PACERONE) 200 MG tablet Take 1 tablet (200 mg total) by mouth daily.  Marland Kitchen apixaban (ELIQUIS) 5 MG TABS tablet Take 1 tablet (5 mg total) by mouth 2 (two) times daily.  . Blood Glucose Monitoring Suppl (ACCU-CHEK GUIDE ME) w/Device KIT Use accu chek meter to check blood sugar three times daily.  . budesonide-formoterol (SYMBICORT) 160-4.5 MCG/ACT inhaler Inhale 2 puffs into the lungs 2 (two) times daily.  Marland Kitchen epoetin alfa-epbx (RETACRIT) 96759 UNIT/ML injection Inject 40,000 Units into the skin every Thursday.   Marland Kitchen exemestane (AROMASIN) 25 MG tablet Take 25 mg by mouth daily after breakfast.  . ezetimibe (ZETIA) 10 MG tablet Take 10 mg by mouth daily.  . famotidine (PEPCID) 20 MG tablet Take 20 mg by mouth 2 (two) times daily.  . furosemide (LASIX) 20 MG tablet Take 20 mg by mouth daily.   Marland Kitchen glucose blood (ACCU-CHEK GUIDE) test strip Use to test blood sugar 3 times daily  . HYDROcodone-acetaminophen (NORCO/VICODIN) 5-325 MG tablet Take 1 tablet by mouth every 8 (eight) hours as needed for severe pain.  Marland Kitchen insulin glargine (LANTUS) 100 UNIT/ML injection Inject 18 Units into the skin daily.  . Insulin Lispro (HUMALOG KWIKPEN [place]) Inject 4-6  Units into the skin 3 (three) times daily.  . Insulin Pen Needle 32G X 4 MM MISC Use as instructed to inject insulin 4 times daily.  Marland Kitchen loratadine (CLARITIN) 10 MG tablet Take 10 mg by mouth daily.   . metoprolol tartrate (LOPRESSOR) 25 MG tablet Take 1 tablet (25 mg total) by mouth 2 (two) times daily.  . midodrine (PROAMATINE) 2.5 MG tablet Take 2.5 mg by mouth 3 (three) times daily with meals.  . nitroGLYCERIN (NITROSTAT) 0.4 MG SL tablet Place 1 tablet (0.4 mg total) under the tongue every 5 (five) minutes as needed for chest pain.  . potassium chloride SA (KLOR-CON) 20 MEQ tablet Take 20 mEq by mouth daily as needed (low potassium).   . rosuvastatin (CRESTOR) 40 MG tablet Take 1 tablet (40 mg total) by mouth at bedtime.  Marland Kitchen zolpidem (AMBIEN) 5 MG tablet Take 5 mg by mouth at bedtime.      Allergies: Allergies  Allergen Reactions  . Amoxicillin Rash and Other (See Comments)    Tolerates Cephalosporins Has patient had a PCN reaction causing immediate rash, facial/tongue/throat swelling, SOB or lightheadedness with hypotension: Yes Has patient had a PCN reaction causing severe rash involving mucus membranes or skin necrosis: Yes Has patient had a PCN reaction that required hospitalization No Has patient had a PCN reaction occurring within the last 10 years: No If all of the above answers are "NO", then may proceed with Cephalosporin use.   . Tape Other (See Comments)    Must use paper tape  . Aldactone [Spironolactone] Other (See Comments)    CKD/hypokalemia  . Imdur [Isosorbide Dinitrate] Other (See Comments)    Headache/severe hypotension/Syncope  . Arimidex [Anastrozole] Nausea Only  . Latex Itching and Other (See Comments)    (Dentist office)  . Tetracycline Rash    Social History: The patient  reports that she has never smoked. She has never used smokeless tobacco. She reports that she does not drink alcohol or use drugs.   Family History: The patient's family history  includes Diabetes in her maternal grandmother and son; Healthy in her brother; Heart attack  in her brother; Heart disease in her brother, father, and maternal grandmother; Heart failure in her father.   Review of Systems: Please see the history of present illness.   All other systems are reviewed and negative.   Physical Exam: VS:  BP 120/64   Pulse (!) 53   Ht 5' 5"  (1.651 m)   Wt 141 lb 1.9 oz (64 kg)   LMP  (LMP Unknown)   SpO2 95%   BMI 23.48 kg/m  .  BMI Body mass index is 23.48 kg/m.  Wt Readings from Last 3 Encounters:  09/09/19 141 lb 1.9 oz (64 kg)  09/03/19 139 lb 9.6 oz (63.3 kg)  08/21/19 137 lb (62.1 kg)    General: Pleasant. Alert and in no acute distress. She looks ok today.  Cardiac: Regular rate and rhythm. Soft outflow murmur. Feet with edema.  Respiratory:  Lungs are clear to auscultation bilaterally with normal work of breathing.  GI: Soft and nontender.  MS: No deformity or atrophy. Gait and ROM intact.  Skin: Warm and dry. Color is normal.  Neuro:  Strength and sensation are intact and no gross focal deficits noted.  Psych: Alert, appropriate and with normal affect.   LABORATORY DATA:  EKG:  EKG is not ordered today.    Lab Results  Component Value Date   WBC 7.1 09/03/2019   HGB 8.3 (L) 09/03/2019   HCT 29.2 (L) 09/03/2019   PLT 159 09/03/2019   GLUCOSE 161 (H) 08/18/2019   CHOL 106 12/02/2018   TRIG 97 12/02/2018   HDL 34 (L) 12/02/2018   LDLDIRECT 53.0 10/08/2016   LDLCALC 53 12/02/2018   ALT 24 08/03/2019   AST 36 08/03/2019   NA 139 08/18/2019   K 4.2 08/18/2019   CL 101 08/18/2019   CREATININE 3.43 (H) 08/18/2019   BUN 48 (H) 08/18/2019   CO2 24 08/18/2019   TSH 3.090 08/03/2019   INR 1.4 (H) 06/24/2019   HGBA1C 6.1 (H) 08/13/2019   MICROALBUR 14.2 (H) 07/02/2019     BNP (last 3 results) Recent Labs    03/04/19 1429  BNP 273.4*    ProBNP (last 3 results) Recent Labs    10/28/18 1041 02/19/19 1126 02/23/19 1221    PROBNP 364.0* 2,996* 2,825*     Other Studies Reviewed Today:  ECHO IMPRESSIONS 05/2019  1. Left ventricular ejection fraction, by visual estimation, is 60 to 65%. The left ventricle has normal function. Left ventricular septal wall thickness was normal. Normal left ventricular posterior wall thickness. There is no left ventricular  hypertrophy.  2. Elevated left ventricular end-diastolic pressure.  3. Left ventricular diastolic parameters are consistent with Grade II diastolic dysfunction (pseudonormalization).  4. Global right ventricle has normal systolic function.The right ventricular size is normal. No increase in right ventricular wall thickness.  5. Left atrial size was severely dilated.  6. Right atrial size was normal.  7. Mild mitral annular calcification.  8. Mild thickening of the anterior and posterior mitral valve leaflet(s).  9. The mitral valve is normal in structure. Mild mitral valve regurgitation. No evidence of mitral stenosis.  10. The tricuspid valve is normal in structure.  11. Aortic valve regurgitation is not visualized. No evidence of aortic valve sclerosis or stenosis.  12. The pulmonic valve was normal in structure. Pulmonic valve regurgitation is trivial.  13. Normal pulmonary artery systolic pressure.  14. The inferior vena cava is normal in size with greater than 50% respiratory variability, suggesting right atrial pressure  of 3 mmHg.   CT CHEST IMPRESSION 04/2019:  Moderate right pleural effusion, increased from prior CT. Associated  right lower lobe atelectasis.  No evidence of pneumonia.  1.2 x 2.4 cm lesion along the inferior aspect of the right  breast/chest wall. While additional findings noted above correlate  with areas of fat necrosis, this lesion was not clearly identified  when correlating with prior breast tomography report. Breast imaging  consultation is suggested for further evaluation, including  potential ultrasound with biopsy versus  breast MR. These results  will be called to the ordering clinician or representative by the  Radiologist Assistant, and communication documented in the PACS or  zVision Dashboard.  Aortic Atherosclerosis (ICD10-I70.0).  Electronically Signed  By: Julian Hy M.D.  On: 05/06/2019 18:11   CT ABDOMEN IMPRESSION 03/2019:  Chest Impression:  1. Mild RIGHT basilar atelectasis and small RIGHT effusion.  2. Increased skin thickening in the lateral RIGHT breast. Recommend  clinical correlation.  3. Stable seroma in the RIGHT breast.   Abdomen / Pelvis Impression:  1. No acute findings in the abdomen pelvis.  2. Nonobstructing calculi in the LEFT renal pelvis.  3. Degenerative endplate change in the lumbar spine similar to  comparison CT 2019.  4. Coronary artery calcification and Aortic Atherosclerosis  (ICD10-I70.0).  Electronically Signed  By: Suzy Bouchard M.D.  On: 03/17/2019 09:59   ABD [place] 02/17/2019  Other findings: There is a somewhat lobulated fluid collection  identified along the inferior aspect of the liver. This measures 2.6  x 3.4 x 4.3 cm. This was not seen on the prior CT examination and  may be related to a focal fluid collection from prior  cholecystectomy. Mild perihepatic ascites is noted.  Note is made of right-sided pleural effusion.  IMPRESSION:  Status post cholecystectomy. There is a lobulated cystic area as  described above along the inferior right hepatic margin. This could  be related to the prior surgery possibly representing a small  seroma. CT may be helpful for further evaluation.  Small right pleural effusion.  Mild ascites.  Increased echogenicity within the liver likely related to fatty  infiltration or underlying hepatocellular disease.  Electronically Signed  By: Inez Catalina M.D.  On: 02/17/2019 10:18    Breast Ultrasound 01/2019  IMPRESSION:  1. New irregular hypoechoic mass along the posterior margin of the  scar right breast 8  o'clock position. Findings are nonspecific  however concerning for the possibility of localized recurrence.  2. Within the outer aspect of the right axilla there are two  adjacent irregular hypoechoic masses just deep to the skin which are  nonspecific. These may represent focal fat necrosis or potentially  recurrent or metastatic disease.  RECOMMENDATION:  1. Ultrasound-guided core needle biopsy right breast mass 8 o'clock  position just deep to the scar.  2. Ultrasound-guided core needle biopsy of both irregular hypoechoic  masses within the outer aspect of the right axilla.    MYOVIEW FINDINGS FROM 06/2013:  Normal resting EKG. Slight ST depressions noted in aVL after  administration of lexiscan. Mild shortness of breath and nausea  resolved after the test. EKG is nondiagnostic for ischemia. TID  ratio 1.05. Lung-heart ratio 0.43. Normal ventricular chamber size.  IMPRESSION:  No evidence for ischemia. Normal wall motion. LVEF 72%.  Electronically Signed  By: Guy Sandifer    Cardiac Cath: 02/23/2013  Left mainstem: Normal  Left anterior descending (LAD): Severe proximal and mid calcification with long proximal 95% stenosis. The mid and  distal vessel is small but free of high grade disease.  Left circumflex (LCx): AV groove has a mid 90% stenosis before a moderate sized MOM. OM1 and OM2 are occluded at the ostium and fill via the SVG. OM3 occluded at the ostium and fills via SVG. The grafted OMs are small and diffusely disease.  Right coronary artery (RCA): Occluded in the mid vessel. The PDA is moderate sized and occluded at the ostium. There is a 70% stenosis in the PDA after the insertion of the vein graft.  Grafts:  LIMA to LAD: Patent  SVG to RCA: Occluded (from original CABG)  SVG to RCA: Patent (from redo CABG). This is mild diffuse plaque within the vein graft.  SVG to OM3: Patent. This is mild diffuse plaque within the vein graft.  SVG sequential to OM1/OM2: Patent. There is  ostial 50% stenosis. There is a patent proximal SVG stent. The native marginals are very small.  Left ventriculography: LV not injected. AVR not crossed.  Final Conclusions: Severe native vessel CAD. Patent grafts as described with nonobstructive disease in the grafts. A stent placed into the sequential SVG is patent with some disease at the ostium that on several views in not occlusive. I did compare the 2009 cath with results today. There is high grade disease in the circumflex AV groove. However, this is not changed from 2009. However, this lesion does appear to lead into a non grafted OM. If she has further symptoms I would consider PCI of the native circumflex.     Assessment/Plan:   1. Recent kidney stone - that has caused worsening CKD - rechecking BMET today. She looks better - stone is gone, stent is out and catheter is out. No bleeding noted.  2. Prior effusion - s/p talc/pleurex - hopefully resolved.   3. CAD - prior CABG as well as redo CABG from 2009 - last cath in 2014 - managed medically - this seems stable at this time. She would be at very high risk for repeat cath given her current status with her kidneys.   4. Orthostatic hypotension - managed with Midodrine. She has had prior injury from this with fractured shoulder as well as head laceration.   5. Chronic diastolic HF - weight is up - more swelling on exam - she is going to restart Lasix and use prn.   6. Chronic anemia - she was transfused just a few days ago   7. CKD - has gotten worse - suspected to be from the recent GU events - followed by Dr. Posey Pronto - rechecking BMET today. Restarting Lasix prn today.   8. Carotid disease - noted 1 to 39% bilateral stenosis from study 10/2017 - will need repeat study 10/2019 - not discussed today   9. Prior breast cancer - recent abnormal mammogram - s/p multiple biopsies. Abnormality noted on the CT - she has had recent biopsies that were negative. This has been reviewed previously  with Dr. Lindi Adie and I defer further imaging to Dr. Lindi Adie. Not discussed today.  10. PAF - back on Eliquis - remains on amiodarone. In sinus by exam today.   33.  COVID-19 Education: The signs and symptoms of COVID-19 were discussed with the patient and how to seek care for testing (follow up with PCP or arrange E-visit).  The importance of social distancing, staying at home, hand hygiene and wearing a mask when out in public were discussed today.  Current medicines are reviewed with the patient today.  The patient does not have concerns regarding medicines other than what has been noted above.  The following changes have been made:  See above.  Labs/ tests ordered today include:    Orders Placed This Encounter  Procedures  . Basic metabolic panel     Disposition:   FU with me in 4 to 6 weeks.   Patient is agreeable to this plan and will call if any problems develop in the interim.   SignedTruitt Merle, NP  09/09/2019 2:09 PM  [facility] [address] [place], [place]  [address] Phone: [phone] Fax: [phone]

## 2019-09-02 NOTE — Progress Notes (Signed)
 Patient Care Team: Skakle, Austin, DO as PCP - General Gerhardt, [name] C, NP as PCP - Cardiology (Nurse Practitioner) Taylor, Gregg W, MD as PCP - Electrophysiology (Cardiology) Newman, David, MD as Consulting Physician (General Surgery) Gudena, Vinay, MD as Consulting Physician (Hematology and Oncology) Kinard, James, MD as Consulting Physician (Radiation Oncology) Taylor, Gregg W, MD as Consulting Physician (Cardiology) Byrum, Robert S, MD as Consulting Physician (Pulmonary Disease) Kumar, Ajay, MD as Consulting Physician (Endocrinology) Causey, Lindsey Cornetto, NP as Nurse Practitioner (Hematology and Oncology) Haworth, Chester C., MD (Neurology) Rosen, Jefry, MD as Consulting Physician (Otolaryngology) Wagoner, Matthew R, DPM as Consulting Physician (Podiatry)  DIAGNOSIS:    ICD-10-CM   1. Anemia of chronic disease  D63.8     SUMMARY OF ONCOLOGIC HISTORY: Oncology History  Breast cancer of upper-inner quadrant of right female breast (HCC)  12/14/2015 Initial Diagnosis   Right breast biopsy 12:30 position: 2 masses, 1.6 cm mass: Invasive ductal carcinoma, grade 2, ER 0%, PR 0%, HER-2 negative, Ki-67 70%; satellite mass 8 mm: IDC grade 2 ER 5%, PR 5%, HER-2 negative, Ki-67 50%; T1cN0 stage IA clinical stage   01/18/2016 Surgery   Right lumpectomy: Multifocal IDC grade 3, 1.9 cm  ER 0%, PR 0%, HER-2 negative, Ki-67 70% and 0.8 cm satellite mass ER 5%, PR 2%, HER-2 negative, Ki-67 50%, high-grade DCIS, margins negative, 0/1 lymph nodes negative T1 cN0 stage IA   02/17/2016 - 04/06/2016 Chemotherapy   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 with above-mentioned history of right breast cancer treated with lumpectomy,adjuvant chemotherapy,radiation, and whois currently on antiestrogen therapywith exemestane.Shealsohas a history of anemiaandis currently receiving Retacritinjectionsand IV iron.Shepresents to the clinic today forfollow-up.  Patient had developed a kidney stone and underwent lithotripsy.  Subsequently she had a lot of bleeding and she continues to have bleeding.  She has a urinary catheter placed yesterday because of urinary retention.  She is very uncomfortable because of that with difficulty sleeping.  She feels extremely tired.   ALLERGIES:  is allergic to amoxicillin; tape; aldactone [spironolactone]; imdur [isosorbide dinitrate]; arimidex [anastrozole]; latex; and tetracycline.  MEDICATIONS:  Current Outpatient Medications  Medication Sig Dispense Refill  . acetaminophen (TYLENOL) 325 MG tablet Take 2 tablets (650 mg total) by mouth every 6 (six) hours as needed for mild pain (or Fever >/= 101).    . albuterol (PROVENTIL HFA;VENTOLIN HFA) 108 (90 Base) MCG/ACT inhaler Inhale 2 puffs into the lungs every 6 (six) hours as needed for wheezing or shortness of breath.    . amiodarone (PACERONE) 200 MG tablet Take 1 tablet (200 mg total) by mouth daily. 90 tablet 3  . apixaban (ELIQUIS) 5 MG TABS tablet Take 1 tablet (5 mg total) by mouth 2 (two) times daily. 180 tablet 2  . Blood Glucose Monitoring Suppl (ACCU-CHEK GUIDE ME) w/Device KIT Use accu chek meter to check blood sugar three times daily. 1 kit 0  . budesonide-formoterol (SYMBICORT) 160-4.5   MCG/ACT inhaler Inhale 2 puffs into the lungs 2 (two) times daily.    Marland Kitchen epoetin alfa-epbx (RETACRIT) 81448 UNIT/ML injection Inject 40,000 Units into the skin  every Thursday.     Marland Kitchen exemestane (AROMASIN) 25 MG tablet Take 25 mg by mouth daily after breakfast.    . ezetimibe (ZETIA) 10 MG tablet Take 10 mg by mouth daily.    . famotidine (PEPCID) 20 MG tablet Take 20 mg by mouth 2 (two) times daily.    . furosemide (LASIX) 20 MG tablet Take 20 mg by mouth daily.     Marland Kitchen glucose blood (ACCU-CHEK GUIDE) test strip Use to test blood sugar 3 times daily 150 each 3  . HYDROcodone-acetaminophen (NORCO/VICODIN) 5-325 MG tablet Take 1 tablet by mouth every 8 (eight) hours as needed for severe pain.    Marland Kitchen insulin glargine (LANTUS) 100 UNIT/ML injection Inject 18 Units into the skin daily.    . Insulin Lispro (HUMALOG KWIKPEN Shepherd) Inject 4-6 Units into the skin 3 (three) times daily.    . Insulin Pen Needle 32G X 4 MM MISC Use as instructed to inject insulin 4 times daily. 400 each 3  . loratadine (CLARITIN) 10 MG tablet Take 10 mg by mouth daily.     . metoprolol tartrate (LOPRESSOR) 25 MG tablet Take 1 tablet (25 mg total) by mouth 2 (two) times daily. 180 tablet 3  . midodrine (PROAMATINE) 2.5 MG tablet Take 2.5 mg by mouth 3 (three) times daily with meals.    . nitroGLYCERIN (NITROSTAT) 0.4 MG SL tablet Place 1 tablet (0.4 mg total) under the tongue every 5 (five) minutes as needed for chest pain. 30 tablet 3  . oxyCODONE (ROXICODONE) 5 MG immediate release tablet Take 1 tablet (5 mg total) by mouth every 4 (four) hours as needed for severe pain. 30 tablet 0  . potassium chloride SA (KLOR-CON) 20 MEQ tablet Take 20 mEq by mouth daily as needed (low potassium).     . rosuvastatin (CRESTOR) 40 MG tablet Take 1 tablet (40 mg total) by mouth at bedtime. 90 tablet 3  . zolpidem (AMBIEN) 5 MG tablet Take 5 mg by mouth at bedtime.      No current facility-administered medications for this visit.    PHYSICAL EXAMINATION: ECOG PERFORMANCE STATUS: 3 - Symptomatic, >50% confined to bed  Vitals:   09/03/19 1332  BP: 105/64  Pulse: (!) 59  Resp: 17  Temp: 98.2 F  (36.8 C)  SpO2: 100%   Filed Weights   09/03/19 1332  Weight: 139 lb 9.6 oz (63.3 kg)    LABORATORY DATA:  I have reviewed the data as listed CMP Latest Ref Rng & Units 08/18/2019 08/13/2019 08/03/2019  Glucose 65 - 99 mg/dL 161(H) 339(H) 233(H)  BUN 8 - 27 mg/dL 48(H) 44(H) 44(H)  Creatinine 0.57 - 1.00 mg/dL 3.43(H) 3.32(H) 2.77(H)  Sodium 134 - 144 mmol/L 139 139 141  Potassium 3.5 - 5.2 mmol/L 4.2 4.2 3.9  Chloride 96 - 106 mmol/L 101 105 103  CO2 20 - 29 mmol/L _0 Calcium 8.7 - 10.3 mg/dL 8.6(L) 8.3(L) 8.7  Total Protein 6.0 - 8.5 g/dL - - 5.9(L)  Total Bilirubin 0.0 - 1.2 mg/dL - - 0.4  Alkaline Phos 39 - 117 IU/L - - 74  AST 0 - 40 IU/L - - 36  ALT 0 - 32 IU/L - - 24    Lab Results  Component Value Date   WBC 7.1 09/03/2019  HGB 8.3 (L) 09/03/2019   HCT 29.2 (L) 09/03/2019   MCV 98.3 09/03/2019   PLT 159 09/03/2019   NEUTROABS 5.6 09/03/2019    ASSESSMENT & PLAN:  Anemia of chronic disease Patient has had longstanding anemia where the hemoglobin was between 10 to 12 g. Since April 2020 her hemoglobin has gotten worse During the same timeframe her creatinine got from 1.5-2.44  Current treatment:Retacritinjectionsweeklystarted7/31/2020 Bone marrow biopsy 04/08/2019: Normocellular bone marrow with trilineage hematopoiesis, absent iron stores, flow cytometry no abnormal B or T-cell clonal abnormalities noted.   IV iron therapy:Venofer weekly x4 04/24/19-05/15/19 Patient also has congestive heart failure and pleural effusions required Pleurx catheter placement. Lab review: Hemoglobin 8.3   Recent kidney stones: Because of the procedures she lost significant amount of blood. I discussed with her about blood transfusion but she does not want to receive it at this time. We will continue her weekly injections. Return to clinic weekly for Retacrit injections and in 1 month for follow-up with me.     No orders of the defined types were placed in this  encounter.  The patient has a good understanding of the overall plan. she agrees with it. she will call with any problems that may develop before the next visit here.  Total time spent: 20 mins including face to face time and time spent for planning, charting and coordination of care  Gudena, Vinay, MD 09/03/2019  I, Molly Dorshimer, am acting as scribe for Dr. Vinay Gudena.  I have reviewed the above documentation for accuracy and completeness, and I agree with the above.       

## 2019-09-03 ENCOUNTER — Other Ambulatory Visit: Payer: Medicare Other

## 2019-09-03 ENCOUNTER — Inpatient Hospital Stay: Payer: Medicare Other

## 2019-09-03 ENCOUNTER — Ambulatory Visit: Payer: Medicare Other

## 2019-09-03 ENCOUNTER — Ambulatory Visit: Payer: Medicare Other | Admitting: Endocrinology

## 2019-09-03 ENCOUNTER — Ambulatory Visit: Payer: Medicare Other | Admitting: Hematology and Oncology

## 2019-09-03 ENCOUNTER — Inpatient Hospital Stay (HOSPITAL_BASED_OUTPATIENT_CLINIC_OR_DEPARTMENT_OTHER): Payer: Medicare Other | Admitting: Hematology and Oncology

## 2019-09-03 ENCOUNTER — Other Ambulatory Visit: Payer: Self-pay

## 2019-09-03 DIAGNOSIS — D638 Anemia in other chronic diseases classified elsewhere: Secondary | ICD-10-CM

## 2019-09-03 DIAGNOSIS — I129 Hypertensive chronic kidney disease with stage 1 through stage 4 chronic kidney disease, or unspecified chronic kidney disease: Secondary | ICD-10-CM | POA: Diagnosis not present

## 2019-09-03 LAB — CBC WITH DIFFERENTIAL (CANCER CENTER ONLY)
Abs Immature Granulocytes: 0.02 10*3/uL (ref 0.00–0.07)
Basophils Absolute: 0.1 10*3/uL (ref 0.0–0.1)
Basophils Relative: 1 %
Eosinophils Absolute: 0.2 10*3/uL (ref 0.0–0.5)
Eosinophils Relative: 3 %
HCT: 29.2 % — ABNORMAL LOW (ref 36.0–46.0)
Hemoglobin: 8.3 g/dL — ABNORMAL LOW (ref 12.0–15.0)
Immature Granulocytes: 0 %
Lymphocytes Relative: 8 %
Lymphs Abs: 0.6 10*3/uL — ABNORMAL LOW (ref 0.7–4.0)
MCH: 27.9 pg (ref 26.0–34.0)
MCHC: 28.4 g/dL — ABNORMAL LOW (ref 30.0–36.0)
MCV: 98.3 fL (ref 80.0–100.0)
Monocytes Absolute: 0.6 10*3/uL (ref 0.1–1.0)
Monocytes Relative: 9 %
Neutro Abs: 5.6 10*3/uL (ref 1.7–7.7)
Neutrophils Relative %: 79 %
Platelet Count: 159 10*3/uL (ref 150–400)
RBC: 2.97 MIL/uL — ABNORMAL LOW (ref 3.87–5.11)
RDW: 14.8 % (ref 11.5–15.5)
WBC Count: 7.1 10*3/uL (ref 4.0–10.5)
nRBC: 0 % (ref 0.0–0.2)

## 2019-09-03 LAB — SAMPLE TO BLOOD BANK

## 2019-09-03 MED ORDER — EPOETIN ALFA-EPBX 40000 UNIT/ML IJ SOLN
40000.0000 [IU] | Freq: Once | INTRAMUSCULAR | Status: AC
  Administered 2019-09-03: 14:00:00 40000 [IU] via SUBCUTANEOUS

## 2019-09-03 MED ORDER — EPOETIN ALFA-EPBX 40000 UNIT/ML IJ SOLN
INTRAMUSCULAR | Status: AC
  Filled 2019-09-03: qty 1

## 2019-09-03 NOTE — Patient Instructions (Signed)

## 2019-09-03 NOTE — Assessment & Plan Note (Signed)
Patient has had longstanding anemia where the hemoglobin was between 10 to 12 g. Since April 2020 her hemoglobin has gotten worse During the same timeframe her creatinine got from 1.5-2.44  Current treatment:Retacritinjectionsweeklystarted7/31/2020 Bone marrow biopsy 04/08/2019: Normocellular bone marrow with trilineage hematopoiesis, absent iron stores, flow cytometry no abnormal B or T-cell clonal abnormalities noted.   IV iron therapy:Venofer weekly x4 04/24/19-05/15/19 Patient also has congestive heart failure and pleural effusions required Pleurx catheter placement. Lab review:  Recent kidney stones Return to clinic weekly for Retacrit injections and in 1 month for follow-up with me.

## 2019-09-04 ENCOUNTER — Other Ambulatory Visit: Payer: Self-pay

## 2019-09-04 ENCOUNTER — Telehealth: Payer: Self-pay

## 2019-09-04 ENCOUNTER — Telehealth: Payer: Self-pay | Admitting: Hematology and Oncology

## 2019-09-04 DIAGNOSIS — D509 Iron deficiency anemia, unspecified: Secondary | ICD-10-CM

## 2019-09-04 DIAGNOSIS — I129 Hypertensive chronic kidney disease with stage 1 through stage 4 chronic kidney disease, or unspecified chronic kidney disease: Secondary | ICD-10-CM | POA: Diagnosis not present

## 2019-09-04 LAB — PREPARE RBC (CROSSMATCH)

## 2019-09-04 NOTE — Telephone Encounter (Signed)
Scheduled 2 units blood per 3/26 sch msg. Patient aware

## 2019-09-04 NOTE — Telephone Encounter (Signed)
Per MD - 2 units of blood needed for 3/27.    Per blood blank, O+ shortage.  OK for 1 unit, if 2 units are still needed most do an H&H after first unit for evaluation.   Per MD - Okay for 1 unit only on 3/27.  Pt was notified and voiced understanding.  Blood bank notified, confirmation [facility] will be available on 3/27.

## 2019-09-05 ENCOUNTER — Inpatient Hospital Stay: Payer: Medicare Other

## 2019-09-05 ENCOUNTER — Other Ambulatory Visit: Payer: Self-pay

## 2019-09-05 DIAGNOSIS — D509 Iron deficiency anemia, unspecified: Secondary | ICD-10-CM

## 2019-09-05 DIAGNOSIS — I129 Hypertensive chronic kidney disease with stage 1 through stage 4 chronic kidney disease, or unspecified chronic kidney disease: Secondary | ICD-10-CM | POA: Diagnosis not present

## 2019-09-05 MED ORDER — SODIUM CHLORIDE 0.9% IV SOLUTION
250.0000 mL | Freq: Once | INTRAVENOUS | Status: AC
  Administered 2019-09-05: 250 mL via INTRAVENOUS
  Filled 2019-09-05: qty 250

## 2019-09-05 MED ORDER — ACETAMINOPHEN 325 MG PO TABS
650.0000 mg | ORAL_TABLET | Freq: Once | ORAL | Status: AC
  Administered 2019-09-05: 650 mg via ORAL

## 2019-09-05 MED ORDER — DIPHENHYDRAMINE HCL 25 MG PO CAPS
25.0000 mg | ORAL_CAPSULE | Freq: Once | ORAL | Status: AC
  Administered 2019-09-05: 25 mg via ORAL

## 2019-09-06 LAB — BPAM RBC
Blood Product Expiration Date: 202104282359
ISSUE DATE / TIME: 202103270836
Unit Type and Rh: 5100

## 2019-09-06 LAB — TYPE AND SCREEN
ABO/RH(D): O POS
Antibody Screen: NEGATIVE
Unit division: 0

## 2019-09-07 ENCOUNTER — Telehealth: Payer: Self-pay | Admitting: Hematology and Oncology

## 2019-09-07 NOTE — Telephone Encounter (Signed)
Scheduled per 03/25 los, patient has been called and notified.

## 2019-09-08 ENCOUNTER — Ambulatory Visit: Payer: Medicare Other | Admitting: Nurse Practitioner

## 2019-09-09 ENCOUNTER — Other Ambulatory Visit: Payer: Self-pay

## 2019-09-09 ENCOUNTER — Ambulatory Visit (INDEPENDENT_AMBULATORY_CARE_PROVIDER_SITE_OTHER): Payer: Medicare Other | Admitting: Nurse Practitioner

## 2019-09-09 ENCOUNTER — Encounter: Payer: Self-pay | Admitting: Nurse Practitioner

## 2019-09-09 VITALS — BP 120/64 | HR 53 | Ht 65.0 in | Wt 141.1 lb

## 2019-09-09 DIAGNOSIS — R0609 Other forms of dyspnea: Secondary | ICD-10-CM

## 2019-09-09 DIAGNOSIS — I428 Other cardiomyopathies: Secondary | ICD-10-CM

## 2019-09-09 DIAGNOSIS — N184 Chronic kidney disease, stage 4 (severe): Secondary | ICD-10-CM | POA: Diagnosis not present

## 2019-09-09 DIAGNOSIS — I2510 Atherosclerotic heart disease of native coronary artery without angina pectoris: Secondary | ICD-10-CM

## 2019-09-09 DIAGNOSIS — I5032 Chronic diastolic (congestive) heart failure: Secondary | ICD-10-CM | POA: Diagnosis not present

## 2019-09-09 DIAGNOSIS — R0600 Dyspnea, unspecified: Secondary | ICD-10-CM

## 2019-09-09 DIAGNOSIS — R55 Syncope and collapse: Secondary | ICD-10-CM

## 2019-09-09 DIAGNOSIS — Z952 Presence of prosthetic heart valve: Secondary | ICD-10-CM

## 2019-09-09 DIAGNOSIS — J90 Pleural effusion, not elsewhere classified: Secondary | ICD-10-CM

## 2019-09-09 NOTE — Patient Instructions (Addendum)
After Visit Summary:  We will be checking the following labs today - BMET   Medication Instructions:    Continue with your current medicines.  You can take the Lasix as needed.     If you need a refill on your cardiac medications before your next appointment, please call your pharmacy.     Testing/Procedures To Be Arranged:  N/A  Follow-Up:   See me in about 4 to 6 weeks    At [facility], you and your health needs are our priority.  As part of our continuing mission to provide you with exceptional heart care, we have created designated Provider Care Teams.  These Care Teams include your primary Cardiologist (physician) and Advanced Practice Providers (APPs -  Physician Assistants and Nurse Practitioners) who all work together to provide you with the care you need, when you need it.  Special Instructions:  . Stay safe, stay home, wash your hands for at least 20 seconds and wear a mask when out in public.  . It was good to talk with you today.    Call the [place] office at [phone] if you have any questions, problems or concerns.

## 2019-09-10 ENCOUNTER — Other Ambulatory Visit: Payer: Self-pay

## 2019-09-10 ENCOUNTER — Inpatient Hospital Stay: Payer: Medicare Other

## 2019-09-10 ENCOUNTER — Inpatient Hospital Stay: Payer: Medicare Other | Attending: Hematology and Oncology

## 2019-09-10 ENCOUNTER — Other Ambulatory Visit: Payer: Self-pay | Admitting: *Deleted

## 2019-09-10 VITALS — BP 122/68 | HR 61 | Temp 98.2°F | Resp 18

## 2019-09-10 DIAGNOSIS — Z79899 Other long term (current) drug therapy: Secondary | ICD-10-CM | POA: Insufficient documentation

## 2019-09-10 DIAGNOSIS — Z7901 Long term (current) use of anticoagulants: Secondary | ICD-10-CM | POA: Insufficient documentation

## 2019-09-10 DIAGNOSIS — D631 Anemia in chronic kidney disease: Secondary | ICD-10-CM | POA: Insufficient documentation

## 2019-09-10 DIAGNOSIS — Z79811 Long term (current) use of aromatase inhibitors: Secondary | ICD-10-CM | POA: Insufficient documentation

## 2019-09-10 DIAGNOSIS — Z923 Personal history of irradiation: Secondary | ICD-10-CM | POA: Diagnosis not present

## 2019-09-10 DIAGNOSIS — C50211 Malignant neoplasm of upper-inner quadrant of right female breast: Secondary | ICD-10-CM | POA: Diagnosis not present

## 2019-09-10 DIAGNOSIS — E875 Hyperkalemia: Secondary | ICD-10-CM

## 2019-09-10 DIAGNOSIS — Z794 Long term (current) use of insulin: Secondary | ICD-10-CM | POA: Insufficient documentation

## 2019-09-10 DIAGNOSIS — N189 Chronic kidney disease, unspecified: Secondary | ICD-10-CM | POA: Insufficient documentation

## 2019-09-10 DIAGNOSIS — Z9221 Personal history of antineoplastic chemotherapy: Secondary | ICD-10-CM | POA: Insufficient documentation

## 2019-09-10 DIAGNOSIS — Z87442 Personal history of urinary calculi: Secondary | ICD-10-CM | POA: Insufficient documentation

## 2019-09-10 DIAGNOSIS — Z170 Estrogen receptor positive status [ER+]: Secondary | ICD-10-CM | POA: Insufficient documentation

## 2019-09-10 DIAGNOSIS — D638 Anemia in other chronic diseases classified elsewhere: Secondary | ICD-10-CM

## 2019-09-10 DIAGNOSIS — I509 Heart failure, unspecified: Secondary | ICD-10-CM | POA: Diagnosis not present

## 2019-09-10 DIAGNOSIS — J90 Pleural effusion, not elsewhere classified: Secondary | ICD-10-CM | POA: Insufficient documentation

## 2019-09-10 DIAGNOSIS — I129 Hypertensive chronic kidney disease with stage 1 through stage 4 chronic kidney disease, or unspecified chronic kidney disease: Secondary | ICD-10-CM | POA: Diagnosis present

## 2019-09-10 LAB — CBC WITH DIFFERENTIAL (CANCER CENTER ONLY)
Abs Immature Granulocytes: 0.01 10*3/uL (ref 0.00–0.07)
Basophils Absolute: 0.1 10*3/uL (ref 0.0–0.1)
Basophils Relative: 1 %
Eosinophils Absolute: 0.1 10*3/uL (ref 0.0–0.5)
Eosinophils Relative: 3 %
HCT: 33.4 % — ABNORMAL LOW (ref 36.0–46.0)
Hemoglobin: 9.5 g/dL — ABNORMAL LOW (ref 12.0–15.0)
Immature Granulocytes: 0 %
Lymphocytes Relative: 11 %
Lymphs Abs: 0.6 10*3/uL — ABNORMAL LOW (ref 0.7–4.0)
MCH: 27.9 pg (ref 26.0–34.0)
MCHC: 28.4 g/dL — ABNORMAL LOW (ref 30.0–36.0)
MCV: 97.9 fL (ref 80.0–100.0)
Monocytes Absolute: 0.6 10*3/uL (ref 0.1–1.0)
Monocytes Relative: 12 %
Neutro Abs: 3.9 10*3/uL (ref 1.7–7.7)
Neutrophils Relative %: 73 %
Platelet Count: 126 10*3/uL — ABNORMAL LOW (ref 150–400)
RBC: 3.41 MIL/uL — ABNORMAL LOW (ref 3.87–5.11)
RDW: 14.6 % (ref 11.5–15.5)
WBC Count: 5.3 10*3/uL (ref 4.0–10.5)
nRBC: 0 % (ref 0.0–0.2)

## 2019-09-10 LAB — BASIC METABOLIC PANEL
BUN/Creatinine Ratio: 12 (ref 12–28)
BUN: 34 mg/dL — ABNORMAL HIGH (ref 8–27)
CO2: 22 mmol/L (ref 20–29)
Calcium: 8.8 mg/dL (ref 8.7–10.3)
Chloride: 108 mmol/L — ABNORMAL HIGH (ref 96–106)
Creatinine, Ser: 2.75 mg/dL — ABNORMAL HIGH (ref 0.57–1.00)
GFR calc Af Amer: 19 mL/min/{1.73_m2} — ABNORMAL LOW (ref >59)
GFR calc non Af Amer: 16 mL/min/{1.73_m2} — ABNORMAL LOW (ref >59)
Glucose: 108 mg/dL — ABNORMAL HIGH (ref 65–99)
Potassium: 5.6 mmol/L — ABNORMAL HIGH (ref 3.5–5.2)
Sodium: 139 mmol/L (ref 134–144)

## 2019-09-10 LAB — SAMPLE TO BLOOD BANK

## 2019-09-10 MED ORDER — EPOETIN ALFA-EPBX 10000 UNIT/ML IJ SOLN
INTRAMUSCULAR | Status: AC
  Filled 2019-09-10: qty 1

## 2019-09-10 MED ORDER — EPOETIN ALFA-EPBX 40000 UNIT/ML IJ SOLN
40000.0000 [IU] | Freq: Once | INTRAMUSCULAR | Status: AC
  Administered 2019-09-10: 40000 [IU] via SUBCUTANEOUS

## 2019-09-10 NOTE — Patient Instructions (Signed)

## 2019-09-11 ENCOUNTER — Telehealth: Payer: Self-pay | Admitting: Medical

## 2019-09-11 NOTE — Telephone Encounter (Signed)
Pt had rx of crestor prescribed for crestor 02-2019. Reviewed since pharmacy sent over note indicating pt not on.   Will you call pt and see why not taking. She is diabetic and has artery disease.  Cardiologist office prescribed.   Is she taking?

## 2019-09-14 NOTE — Telephone Encounter (Signed)
Called pt and lvm to return call.  

## 2019-09-15 ENCOUNTER — Other Ambulatory Visit: Payer: Medicare Other

## 2019-09-15 ENCOUNTER — Other Ambulatory Visit: Payer: Self-pay

## 2019-09-15 ENCOUNTER — Encounter (HOSPITAL_BASED_OUTPATIENT_CLINIC_OR_DEPARTMENT_OTHER): Payer: Medicare Other | Attending: Internal Medicine | Admitting: Internal Medicine

## 2019-09-15 DIAGNOSIS — I2510 Atherosclerotic heart disease of native coronary artery without angina pectoris: Secondary | ICD-10-CM | POA: Insufficient documentation

## 2019-09-15 DIAGNOSIS — M1990 Unspecified osteoarthritis, unspecified site: Secondary | ICD-10-CM | POA: Diagnosis not present

## 2019-09-15 DIAGNOSIS — Z952 Presence of prosthetic heart valve: Secondary | ICD-10-CM | POA: Insufficient documentation

## 2019-09-15 DIAGNOSIS — I480 Paroxysmal atrial fibrillation: Secondary | ICD-10-CM | POA: Insufficient documentation

## 2019-09-15 DIAGNOSIS — C50211 Malignant neoplasm of upper-inner quadrant of right female breast: Secondary | ICD-10-CM | POA: Insufficient documentation

## 2019-09-15 DIAGNOSIS — Z951 Presence of aortocoronary bypass graft: Secondary | ICD-10-CM | POA: Diagnosis not present

## 2019-09-15 DIAGNOSIS — I5032 Chronic diastolic (congestive) heart failure: Secondary | ICD-10-CM | POA: Diagnosis not present

## 2019-09-15 DIAGNOSIS — L89313 Pressure ulcer of right buttock, stage 3: Secondary | ICD-10-CM | POA: Diagnosis not present

## 2019-09-15 DIAGNOSIS — J45909 Unspecified asthma, uncomplicated: Secondary | ICD-10-CM | POA: Insufficient documentation

## 2019-09-15 DIAGNOSIS — Z880 Allergy status to penicillin: Secondary | ICD-10-CM | POA: Diagnosis not present

## 2019-09-15 DIAGNOSIS — Z881 Allergy status to other antibiotic agents status: Secondary | ICD-10-CM | POA: Diagnosis not present

## 2019-09-15 DIAGNOSIS — E11621 Type 2 diabetes mellitus with foot ulcer: Secondary | ICD-10-CM | POA: Diagnosis present

## 2019-09-15 DIAGNOSIS — E1140 Type 2 diabetes mellitus with diabetic neuropathy, unspecified: Secondary | ICD-10-CM | POA: Diagnosis not present

## 2019-09-15 DIAGNOSIS — I110 Hypertensive heart disease with heart failure: Secondary | ICD-10-CM | POA: Diagnosis not present

## 2019-09-15 DIAGNOSIS — E875 Hyperkalemia: Secondary | ICD-10-CM

## 2019-09-15 LAB — BASIC METABOLIC PANEL
BUN/Creatinine Ratio: 16 (ref 12–28)
BUN: 36 mg/dL — ABNORMAL HIGH (ref 8–27)
CO2: 19 mmol/L — ABNORMAL LOW (ref 20–29)
Calcium: 8.6 mg/dL — ABNORMAL LOW (ref 8.7–10.3)
Chloride: 108 mmol/L — ABNORMAL HIGH (ref 96–106)
Creatinine, Ser: 2.3 mg/dL — ABNORMAL HIGH (ref 0.57–1.00)
GFR calc Af Amer: 23 mL/min/{1.73_m2} — ABNORMAL LOW (ref >59)
GFR calc non Af Amer: 20 mL/min/{1.73_m2} — ABNORMAL LOW (ref >59)
Glucose: 150 mg/dL — ABNORMAL HIGH (ref 65–99)
Potassium: 5.6 mmol/L — ABNORMAL HIGH (ref 3.5–5.2)
Sodium: 138 mmol/L (ref 134–144)

## 2019-09-16 ENCOUNTER — Other Ambulatory Visit: Payer: Self-pay | Admitting: *Deleted

## 2019-09-16 ENCOUNTER — Other Ambulatory Visit: Payer: Self-pay | Admitting: Endocrinology

## 2019-09-16 DIAGNOSIS — E875 Hyperkalemia: Secondary | ICD-10-CM

## 2019-09-16 NOTE — Progress Notes (Signed)
LORRIN, BODNER ([id number]) Visit Report for 09/15/2019 Debridement Details Patient Name: Date of Service: Hannah [name], Hannah [name] 09/15/2019 2:00 PM Medical Record [id number] Patient Account Number: [id number] Date of Birth/Sex: 02/02/1946 (74 y.o. Female) Treating RN: Carlene Coria Primary Care Provider: [name] Other Clinician: Referring Provider: Treating Provider/Extender:Sherie Dobrowolski, Quincy Carnes, [place] Weeks in Treatment: 6 Debridement Performed for Wound #1 Right Gluteus Assessment: Performed By: Physician Ricard Dillon., MD Debridement Type: Debridement Level of Consciousness (Pre- Awake and Alert procedure): Pre-procedure Yes - 15:10 Verification/Time Out Taken: Start Time: 15:10 Pain Control: Lidocaine 5% topical ointment Total Area Debrided (L x W): 0.3 (cm) x 0.5 (cm) = 0.15 (cm) Tissue and other material Viable, Non-Viable, Slough, Subcutaneous, Skin: Dermis , Skin: Epidermis, Slough debrided: Level: Skin/Subcutaneous Tissue Debridement Description: Excisional Instrument: Curette Bleeding: Minimum Hemostasis Achieved: Pressure End Time: 15:15 Procedural Pain: 4 Post Procedural Pain: 0 Response to Treatment: Procedure was tolerated well Level of Consciousness Awake and Alert (Post-procedure): Post Debridement Measurements of Total Wound Length: (cm) 0.3 Stage: Category/Stage III Width: (cm) 0.5 Depth: (cm) 0.5 Volume: (cm) 0.059 Character of Wound/Ulcer Post Improved Debridement: Post Procedure Diagnosis Same as Pre-procedure Electronic Signature(s) Signed: 09/15/2019 6:13:52 PM By: Linton Ham MD Signed: 09/15/2019 6:43:44 PM By: Carlene Coria RN Entered By: Linton Ham on 09/15/2019 15:47:49 -------------------------------------------------------------------------------- HPI Details Patient Name: Date of Service: Hannah [name]. 09/15/2019 2:00 PM Medical Record [id number] Patient Account Number: [id number] Date of  Birth/Sex: Treating RN: Feb 05, 1946 (74 y.o. Female) Carlene Coria Primary Care Provider: [name] Other Clinician: Referring Provider: Treating Provider/Extender:Stephanye Finnicum, Quincy Carnes, [place] Weeks in Treatment: 6 History of Present Illness HPI Description: ADMISSION 08/04/2019 This is a pleasant 74 year old woman who is accompanied by her husband. She has had a lot of ongoing medical issues recently some of which are related to right breast cancer status post right lumpectomy and chemotherapy. She also had a right pleural effusion and had a wound VAC placed but is now status post pleurodesis on 06/26/2019. She also has had iron deficiency anemia I think of uncertain etiology but she has been receiving IV iron at hematology. About a month ago she noted pain on her upper right buttock. She had her husband finally have a look at this and she had a pressure ulcer. She went to see her primary doctor who told her to keep this covered and dry. Past medical history; type 2 diabetes, coronary artery disease status post CABG in 1998 with a redo in 2009 she has had a bioprosthetic prosthetic aortic valve replacement, paroxysmal atrial fibrillation, diastolic heart failure, right breast cancer status post lumpectomy and chemotherapy, residual seroma in the right breast, right pleural effusion status post drain and pleurodesis, iron deficiency anemia, hypertension and orthostatic hypotension, asthma, nephrolithiasis 3/2; the patient readmitted to the clinic last week. She has a small but punched out wound in the upper right buttock and close proximity to the gluteal cleft. We use silver collagen. She has home health and she has a daughter-in-law who is an LPN is changing this as well every second day. 3/9; the patient was admitted to the clinic 2 weeks ago. She had a small pressure ulcer on the upper right buttock. We have been using silver collagen. Unfortunately she comes back in with very adherent  debris over the surface of the wound 3/16; small wound on the right buttock. Absolutely no better. Changed to Iodoflex last time still the same adherent debris 3/23; small wound on the right buttock somewhat smaller but still with  an inherent fibrinous surface. We have been using Iodoflex. 4/6; small wound on the right buttock. Absolutely no change. She still has the same fibrinous base to this which is very difficult to debride. We have been using Iodoflex, I will change her to Santyl change daily. Electronic Signature(s) Signed: 09/15/2019 6:13:52 PM By: Linton Ham MD Entered By: Linton Ham on 09/15/2019 15:48:26 -------------------------------------------------------------------------------- Physical Exam Details Patient Name: Date of Service: Hannah [name] 09/15/2019 2:00 PM Medical Record [phone] Patient Account Number: [id number] Date of Birth/Sex: Treating RN: [birth year] (74 y.o. Female) Carlene Coria Primary Care Provider: [name] Other Clinician: Referring Provider: Treating Provider/Extender:Phillips Goulette, Quincy Carnes, [place] Weeks in Treatment: 6 Constitutional Sitting or standing Blood Pressure is within target range for patient.. Pulse regular and within target range for patient.Marland Kitchen Respirations regular, non-labored and within target range.. Temperature is normal and within the target range for the patient.Marland Kitchen Appears in no distress. Notes Wound exam; absolutely no change. Again a very difficult debridement with a #3 curette. I seem to be able to get this to a viable surface although each time she comes in with the same adherent fibrinous necrotic debris. There is no evidence of surrounding infection this does not go to bone but significant relative depth to the surface area Electronic Signature(s) Signed: 09/15/2019 6:13:52 PM By: Linton Ham MD Entered By: Linton Ham on 09/15/2019  15:50:01 -------------------------------------------------------------------------------- Physician Orders Details Patient Name: Date of Service: Hannah [name]. 09/15/2019 2:00 PM Medical Record [id number] Patient Account Number: [id number] Date of Birth/Sex: Treating RN: [birth year] (74 y.o. Female) Carlene Coria Primary Care Provider: [name] Other Clinician: Referring Provider: Treating Provider/Extender:Cauy Melody, Quincy Carnes, [place] Weeks in Treatment: 6 Verbal / Phone Orders: No Diagnosis Coding ICD-10 Coding Code Description L89.313 Pressure ulcer of right buttock, stage 3 C50.211 Malignant neoplasm of upper-inner quadrant of right female breast Follow-up Appointments Return Appointment in 2 weeks. Dressing Change Frequency Change dressing every day. Wound Cleansing Wound #1 Right Gluteus Clean wound with Normal Saline. Primary Wound Dressing Santyl Ointment Secondary Dressing Foam Border Patient Medications Allergies: amoxicillin, tetracycline, adhesive, Aldactone, Imdur, Arimidex, latex Notifications Medication Indication Start End Santyl 09/15/2019 DOSE topical 250 unit/gram ointment - ointment topical to wound area change daily Electronic Signature(s) Signed: 09/15/2019 3:51:54 PM By: Linton Ham MD Entered By: Linton Ham on 09/15/2019 15:51:54 -------------------------------------------------------------------------------- Problem List Details Patient Name: Date of Service: Hannah [name]. 09/15/2019 2:00 PM Medical Record [phone] Patient Account Number: [id number] Date of Birth/Sex: Treating RN: [birth year] (74 y.o. Female) Carlene Coria Primary Care Provider: [name] Other Clinician: Referring Provider: Treating Provider/Extender:Oather Muilenburg, Quincy Carnes, [place] Weeks in Treatment: 6 Active Problems ICD-10 Evaluated Encounter Code Description Active Date Today Diagnosis L89.313 Pressure ulcer of right buttock, stage 3  08/04/2019 No Yes C50.211 Malignant neoplasm of upper-inner quadrant of right 08/04/2019 No Yes female breast Inactive Problems Resolved Problems Electronic Signature(s) Signed: 09/15/2019 6:13:52 PM By: Linton Ham MD Entered By: Linton Ham on 09/15/2019 15:47:31 -------------------------------------------------------------------------------- Progress Note Details Patient Name: Date of Service: Hannah [name]. 09/15/2019 2:00 PM Medical Record [id number] Patient Account Number: [id number] Date of Birth/Sex: Treating RN: [birth year] (74 y.o. Female) Carlene Coria Primary Care Provider: [name] Other Clinician: Referring Provider: Treating Provider/Extender:Lakya Schrupp, Quincy Carnes, [place] Weeks in Treatment: 6 Subjective History of Present Illness (HPI) ADMISSION 08/04/2019 This is a pleasant 74 year old woman who is accompanied by her husband. She has had a lot of ongoing medical issues recently some of which are related to right breast cancer status post right lumpectomy and  chemotherapy. She also had a right pleural effusion and had a wound VAC placed but is now status post pleurodesis on 06/26/2019. She also has had iron deficiency anemia I think of uncertain etiology but she has been receiving IV iron at hematology. About a month ago she noted pain on her upper right buttock. She had her husband finally have a look at this and she had a pressure ulcer. She went to see her primary doctor who told her to keep this covered and dry. Past medical history; type 2 diabetes, coronary artery disease status post CABG in 1998 with a redo in 2009 she has had a bioprosthetic prosthetic aortic valve replacement, paroxysmal atrial fibrillation, diastolic heart failure, right breast cancer status post lumpectomy and chemotherapy, residual seroma in the right breast, right pleural effusion status post drain and pleurodesis, iron deficiency anemia, hypertension and orthostatic  hypotension, asthma, nephrolithiasis 3/2; the patient readmitted to the clinic last week. She has a small but punched out wound in the upper right buttock and close proximity to the gluteal cleft. We use silver collagen. She has home health and she has a daughter-in-law who is an LPN is changing this as well every second day. 3/9; the patient was admitted to the clinic 2 weeks ago. She had a small pressure ulcer on the upper right buttock. We have been using silver collagen. Unfortunately she comes back in with very adherent debris over the surface of the wound 3/16; small wound on the right buttock. Absolutely no better. Changed to Iodoflex last time still the same adherent debris 3/23; small wound on the right buttock somewhat smaller but still with an inherent fibrinous surface. We have been using Iodoflex. 4/6; small wound on the right buttock. Absolutely no change. She still has the same fibrinous base to this which is very difficult to debride. We have been using Iodoflex, I will change her to Santyl change daily. Objective Constitutional Sitting or standing Blood Pressure is within target range for patient.. Pulse regular and within target range for patient.Marland Kitchen Respirations regular, non-labored and within target range.. Temperature is normal and within the target range for the patient.Marland Kitchen Appears in no distress. Vitals Time Taken: 2:34 PM, Height: 64 in, Weight: 145 lbs, BMI: 24.9, Temperature: 98.5 F, Pulse: 50 bpm, Respiratory Rate: 18 breaths/min, Blood Pressure: 116/42 mmHg, Capillary Blood Glucose: 123 mg/dl. General Notes: Wound exam; absolutely no change. Again a very difficult debridement with a #3 curette. I seem to be able to get this to a viable surface although each time she comes in with the same adherent fibrinous necrotic debris. There is no evidence of surrounding infection this does not go to bone but significant relative depth to the surface area Integumentary (Hair,  Skin) Wound #1 status is Open. Original cause of wound was Pressure Injury. The wound is located on the Right Gluteus. The wound measures 0.3cm length x 0.5cm width x 0.5cm depth; 0.118cm^2 area and 0.059cm^3 volume. There is Fat Layer (Subcutaneous Tissue) Exposed exposed. There is no tunneling or undermining noted. There is a medium amount of serous drainage noted. The wound margin is flat and intact. There is no granulation within the wound bed. There is a large (67-100%) amount of necrotic tissue within the wound bed including Adherent Slough. Assessment Active Problems ICD-10 Pressure ulcer of right buttock, stage 3 Malignant neoplasm of upper-inner quadrant of right female breast Procedures Wound #1 Pre-procedure diagnosis of Wound #1 is a Pressure Ulcer located on the Right Gluteus . There  was a Excisional Skin/Subcutaneous Tissue Debridement with a total area of 0.15 sq cm performed by Ricard Dillon., MD. With the following instrument(s): Curette to remove Viable and Non-Viable tissue/material. Material removed includes Subcutaneous Tissue, Slough, Skin: Dermis, and Skin: Epidermis after achieving pain control using Lidocaine 5% topical ointment. No specimens were taken. A time out was conducted at 15:10, prior to the start of the procedure. A Minimum amount of bleeding was controlled with Pressure. The procedure was tolerated well with a pain level of 4 throughout and a pain level of 0 following the procedure. Post Debridement Measurements: 0.3cm length x 0.5cm width x 0.5cm depth; 0.059cm^3 volume. Post debridement Stage noted as Category/Stage III. Character of Wound/Ulcer Post Debridement is improved. Post procedure Diagnosis Wound #1: Same as Pre-Procedure Plan Follow-up Appointments: Return Appointment in 2 weeks. Dressing Change Frequency: Change dressing every day. Wound Cleansing: Wound #1 Right Gluteus: Clean wound with Normal Saline. Primary Wound  Dressing: Santyl Ointment Secondary Dressing: Foam Border The following medication(s) was prescribed: Santyl topical 250 unit/gram ointment ointment topical to wound area change daily starting 09/15/2019 1. Change to Santyl 2. Border foam change daily Electronic Signature(s) Signed: 09/15/2019 3:52:16 PM By: Linton Ham MD Entered By: Linton Ham on 09/15/2019 15:52:16 -------------------------------------------------------------------------------- SuperBill Details Patient Name: Date of Service: Hannah [name] 09/15/2019 Medical Record [id number] Patient Account Number: [id number] Date of Birth/Sex: Treating RN: August 16, 1945 (74 y.o. Female) Carlene Coria Primary Care Provider: [name] Other Clinician: Referring Provider: Treating Provider/Extender:Anely Spiewak, Quincy Carnes, [place] Weeks in Treatment: 6 Diagnosis Coding ICD-10 Codes Code Description O13.086 Pressure ulcer of right buttock, stage 3 C50.211 Malignant neoplasm of upper-inner quadrant of right female breast Facility Procedures CPT4 Code: [id number] 1 Description: 9528 - DEB SUBQ TISSUE 20 SQ CM/< ICD-10 Diagnosis Description L89.313 Pressure ulcer of right buttock, stage 3 Modifier: Quantity: 1 Physician Procedures CPT4 Code: [id number] Description: 10272 - WC PHYS SUBQ TISS 20 SQ CM ICD-10 Diagnosis Description L89.313 Pressure ulcer of right buttock, stage 3 Modifier: Quantity: 1 Electronic Signature(s) Signed: 09/15/2019 6:13:52 PM By: Linton Ham MD Entered By: Linton Ham on 09/15/2019 15:52:29

## 2019-09-16 NOTE — Progress Notes (Signed)
error 

## 2019-09-17 ENCOUNTER — Ambulatory Visit: Payer: Medicare Other

## 2019-09-17 ENCOUNTER — Other Ambulatory Visit: Payer: Medicare Other

## 2019-09-17 ENCOUNTER — Inpatient Hospital Stay: Payer: Medicare Other

## 2019-09-17 ENCOUNTER — Other Ambulatory Visit: Payer: Self-pay

## 2019-09-17 VITALS — BP 120/64 | HR 60 | Temp 98.9°F | Resp 18

## 2019-09-17 DIAGNOSIS — D638 Anemia in other chronic diseases classified elsewhere: Secondary | ICD-10-CM

## 2019-09-17 DIAGNOSIS — I129 Hypertensive chronic kidney disease with stage 1 through stage 4 chronic kidney disease, or unspecified chronic kidney disease: Secondary | ICD-10-CM | POA: Diagnosis not present

## 2019-09-17 LAB — CBC WITH DIFFERENTIAL (CANCER CENTER ONLY)
Abs Immature Granulocytes: 0 10*3/uL (ref 0.00–0.07)
Basophils Absolute: 0.1 10*3/uL (ref 0.0–0.1)
Basophils Relative: 1 %
Eosinophils Absolute: 0.2 10*3/uL (ref 0.0–0.5)
Eosinophils Relative: 4 %
HCT: 33.4 % — ABNORMAL LOW (ref 36.0–46.0)
Hemoglobin: 9.5 g/dL — ABNORMAL LOW (ref 12.0–15.0)
Immature Granulocytes: 0 %
Lymphocytes Relative: 11 %
Lymphs Abs: 0.6 10*3/uL — ABNORMAL LOW (ref 0.7–4.0)
MCH: 27.6 pg (ref 26.0–34.0)
MCHC: 28.4 g/dL — ABNORMAL LOW (ref 30.0–36.0)
MCV: 97.1 fL (ref 80.0–100.0)
Monocytes Absolute: 0.6 10*3/uL (ref 0.1–1.0)
Monocytes Relative: 12 %
Neutro Abs: 3.8 10*3/uL (ref 1.7–7.7)
Neutrophils Relative %: 72 %
Platelet Count: 139 10*3/uL — ABNORMAL LOW (ref 150–400)
RBC: 3.44 MIL/uL — ABNORMAL LOW (ref 3.87–5.11)
RDW: 14.7 % (ref 11.5–15.5)
WBC Count: 5.3 10*3/uL (ref 4.0–10.5)
nRBC: 0 % (ref 0.0–0.2)

## 2019-09-17 LAB — SAMPLE TO BLOOD BANK

## 2019-09-17 MED ORDER — EPOETIN ALFA-EPBX 40000 UNIT/ML IJ SOLN
40000.0000 [IU] | Freq: Once | INTRAMUSCULAR | Status: AC
  Administered 2019-09-17: 40000 [IU] via SUBCUTANEOUS

## 2019-09-17 MED ORDER — EPOETIN ALFA-EPBX 40000 UNIT/ML IJ SOLN
INTRAMUSCULAR | Status: AC
  Filled 2019-09-17: qty 1

## 2019-09-17 NOTE — Patient Instructions (Signed)

## 2019-09-22 ENCOUNTER — Other Ambulatory Visit: Payer: Medicare Other

## 2019-09-22 ENCOUNTER — Other Ambulatory Visit: Payer: Self-pay

## 2019-09-22 DIAGNOSIS — E875 Hyperkalemia: Secondary | ICD-10-CM

## 2019-09-23 ENCOUNTER — Other Ambulatory Visit: Payer: Self-pay | Admitting: Nurse Practitioner

## 2019-09-23 ENCOUNTER — Other Ambulatory Visit: Payer: Self-pay | Admitting: Hematology and Oncology

## 2019-09-23 LAB — BASIC METABOLIC PANEL
BUN/Creatinine Ratio: 17 (ref 12–28)
BUN: 36 mg/dL — ABNORMAL HIGH (ref 8–27)
CO2: 21 mmol/L (ref 20–29)
Calcium: 8.9 mg/dL (ref 8.7–10.3)
Chloride: 110 mmol/L — ABNORMAL HIGH (ref 96–106)
Creatinine, Ser: 2.07 mg/dL — ABNORMAL HIGH (ref 0.57–1.00)
GFR calc Af Amer: 27 mL/min/{1.73_m2} — ABNORMAL LOW (ref >59)
GFR calc non Af Amer: 23 mL/min/{1.73_m2} — ABNORMAL LOW (ref >59)
Glucose: 206 mg/dL — ABNORMAL HIGH (ref 65–99)
Potassium: 5.1 mmol/L (ref 3.5–5.2)
Sodium: 142 mmol/L (ref 134–144)

## 2019-09-23 NOTE — Telephone Encounter (Signed)
Pt last saw Truitt Merle, NP on 09/09/19, last labs 09/22/19 Creat 2.07, age 74, weight 64kg, based on specified criteria pt is on appropriate dosage of Eliquis 5mg  BID.  Will refill rx.

## 2019-09-23 NOTE — Progress Notes (Signed)
MCKENLEE, MANGHAM ([id number]) Visit Report for 08/11/2019 Arrival Information Details Patient Name: Date of Service: Hannah [name], Hannah [name] 08/11/2019 2:15 PM Medical Record [id number] Patient Account Number: [id number] Date of Birth/Sex: Treating RN: Aug 15, 1945 (74 y.o. Female) Carlene Coria Primary Care Mathan Darroch: [name] Other Clinician: Referring Audrey Thull: Treating Dalen Hennessee/Extender:Robson, Quincy Carnes, [place] Weeks in Treatment: 1 Visit Information History Since Last Visit Added or deleted any medications: No Patient Arrived: Ambulatory Any new allergies or adverse reactions: No Arrival Time: 14:27 Had a fall or experienced change in No Accompanied By: husband activities of daily living that may affect Transfer Assistance: None risk of falls: Patient Identification Verified: Yes Signs or symptoms of abuse/neglect since last No Secondary Verification Process Completed: Yes visito Patient Requires Transmission-Based No Hospitalized since last visit: No Precautions: Implantable device outside of the clinic excluding No Patient Has Alerts: No cellular tissue based products placed in the center since last visit: Has Dressing in Place as Prescribed: Yes Pain Present Now: Yes Electronic Signature(s) Signed: 09/23/2019 9:21:08 AM By: Sandre Kitty Entered By: Sandre Kitty on 08/11/2019 14:27:49 -------------------------------------------------------------------------------- Encounter Discharge Information Details Patient Name: Date of Service: Hannah [name]. 08/11/2019 2:15 PM Medical Record [id number] Patient Account Number: [id number] Date of Birth/Sex: Treating RN: 1946/04/24 (74 y.o. Female) Kela Millin Primary Care Mishal Probert: [name] Other Clinician: Referring Shakeeta Godette: Treating Maxie Slovacek/Extender:Robson, Quincy Carnes, [place]UST[place] Weeks in Treatment: 1 Encounter Discharge Information Items Post Procedure Vitals Discharge Condition:  Stable Temperature (F): 98.3 Ambulatory Status: Ambulatory Pulse (bpm): 63 Discharge Destination: Home Respiratory Rate (breaths/min): 18 Transportation: Private Auto Blood Pressure (mmHg): 159/52 Accompanied By: husband Schedule Follow-up Appointment: Yes Clinical Summary of Care: Patient Declined Electronic Signature(s) Signed: 08/11/2019 5:07:00 PM By: Kela Millin Entered By: Kela Millin on 08/11/2019 15:21:02 -------------------------------------------------------------------------------- Multi Wound Chart Details Patient Name: Date of Service: Hannah [name]. 08/11/2019 2:15 PM Medical Record [id number] Patient Account Number: [id number] Date of Birth/Sex: Treating RN: Dec 19, 1945 (74 y.o. Female) Carlene Coria Primary Care Tiyah Zelenak: [name] Other Clinician: Referring Kandance Yano: Treating Courtland Reas/Extender:Robson, Quincy Carnes, [place] Weeks in Treatment: 1 Vital Signs Height(in): 64 Capillary Blood 120 Glucose(mg/dl): Weight(lbs): 145 Pulse(bpm): 77 Body Mass Index(BMI): 25 Blood Pressure(mmHg): 159/52 Temperature(F): 98.3 Respiratory 18 Rate(breaths/min): Photos: [1:No Photos] [N/A:N/A] Wound Location: [1:Right Gluteus] [N/A:N/A] Wounding Event: [1:Pressure Injury] [N/A:N/A] Primary Etiology: [1:Pressure Ulcer] [N/A:N/A] Comorbid History: [1:Cataracts, Anemia, Asthma, N/A Arrhythmia, Congestive Heart Failure, Coronary Artery Disease, Hypertension, Type II Diabetes, Osteoarthritis, Neuropathy, Received Chemotherapy, Received Radiation, Confinement Anxiety] Date Acquired: [1:07/04/2019] [N/A:N/A] Weeks of Treatment: [1:1] [N/A:N/A] Wound Status: [1:Open] [N/A:N/A] Measurements L x W x D 0.5x0.8x0.7 [N/A:N/A] (cm) Area (cm) : [1:0.314] [N/A:N/A] Volume (cm) : [1:0.22] [N/A:N/A] % Reduction in Area: [1:33.30%] [N/A:N/A] % Reduction in Volume: -134.00% [N/A:N/A] Starting Position 1 3 (o'clock): Ending Position 1 [1:6] (o'clock): Maximum  Distance 1 [1:0.5] (cm): Undermining: [1:Yes] [N/A:N/A] Classification: [1:Category/Stage III] [N/A:N/A] Exudate Amount: [1:Small] [N/A:N/A] Exudate Type: [1:Serous] [N/A:N/A] Exudate Color: [1:amber] [N/A:N/A] Wound Margin: [1:Flat and Intact] [N/A:N/A] Granulation Amount: [1:Small (1-33%)] [N/A:N/A] Granulation Quality: [1:Pink, Pale] [N/A:N/A] Necrotic Amount: [1:Large (67-100%)] [N/A:N/A] Exposed Structures: [1:Fat Layer (Subcutaneous Tissue) Exposed: Yes Fascia: No Tendon: No Muscle: No Joint: No Bone: No] [N/A:N/A] Epithelialization: [1:Small (1-33%)] [N/A:N/A] Debridement: [1:Debridement - Excisional] [N/A:N/A] Pre-procedure [1:15:04] [N/A:N/A] Verification/Time Out Taken: Pain Control: [1:Other] [N/A:N/A] Tissue Debrided: [1:Subcutaneous, Slough] [N/A:N/A] Level: [1:Skin/Subcutaneous Tissue] [N/A:N/A] Debridement Area (sq cm):0.4 [N/A:N/A] Instrument: [1:Curette] [N/A:N/A] Bleeding: [1:Minimum] [N/A:N/A] Hemostasis Achieved: [1:Pressure] [N/A:N/A] Procedural Pain: [1:2] [N/A:N/A] Post Procedural Pain: [1:0] [N/A:N/A] Debridement Treatment Procedure was tolerated [N/A:N/A] Response: [1:well] Post  Debridement [1:0.5x0.8x0.7] [N/A:N/A] Measurements L x W x D (cm) Post Debridement [1:0.22] [N/A:N/A] Volume: (cm) Post Debridement Stage: Category/Stage III [N/A:N/A N/A] Treatment Notes Electronic Signature(s) Signed: 08/11/2019 5:14:23 PM By: Linton Ham MD Signed: 08/12/2019 5:16:33 PM By: Carlene Coria RN Entered By: Linton Ham on 08/11/2019 15:12:24 -------------------------------------------------------------------------------- Multi-Disciplinary Care Plan Details Patient Name: Date of Service: Hannah [name]. 08/11/2019 2:15 PM Medical Record [phone] Patient Account Number: [id number] Date of Birth/Sex: Treating RN: July 29, 1945 (74 y.o. Female) Carlene Coria Primary Care Julen Rubert: [name] Other Clinician: Referring Rodgers Likes: Treating  Amantha Sklar/Extender:Robson, Quincy Carnes, [place] Weeks in Treatment: 1 Active Inactive Wound/Skin Impairment Nursing Diagnoses: Knowledge deficit related to ulceration/compromised skin integrity Goals: Ulcer/skin breakdown will have a volume reduction of 30% by week 4 Date Initiated: 08/04/2019 Target Resolution Date: 09/04/2019 Goal Status: Active Ulcer/skin breakdown will have a volume reduction of 50% by week 8 Date Initiated: 08/04/2019 Target Resolution Date: 09/04/2019 Goal Status: Active Interventions: Assess patient/caregiver ability to obtain necessary supplies Assess patient/caregiver ability to perform ulcer/skin care regimen upon admission and as needed Assess ulceration(s) every visit Notes: Electronic Signature(s) Signed: 08/12/2019 5:16:33 PM By: Carlene Coria RN Entered By: Carlene Coria on 08/11/2019 14:13:35 -------------------------------------------------------------------------------- Pain Assessment Details Patient Name: Date of Service: JACK, MINEAU 08/11/2019 2:15 PM Medical Record [id number] Patient Account Number: [id number] Date of Birth/Sex: Treating RN: 1946/06/08 (74 y.o. Female) Carlene Coria Primary Care Shanan Mcmiller: [name] Other Clinician: Referring Sanjana Folz: Treating Averey Trompeter/Extender:Robson, Quincy Carnes, [place] Weeks in Treatment: 1 Active Problems Location of Pain Severity and Description of Pain Patient Has Paino Yes Site Locations Rate the pain. Current Pain Level: 4 Pain Management and Medication Current Pain Management: Electronic Signature(s) Signed: 08/12/2019 5:16:33 PM By: Carlene Coria RN Signed: 09/23/2019 9:21:08 AM By: Sandre Kitty Entered By: Sandre Kitty on 08/11/2019 14:29:38 -------------------------------------------------------------------------------- Patient/Caregiver Education Details Patient Name: Hannah [name] 3/2/2021andnbsp2:15 Date of Service: PM Medical Record [id number] Number: Patient  Account Number: [id number] Treating RN: Date of Birth/Gender: [birth year] (74 y.o. Carlene Coria Female) Other Clinician: Primary Care Physician: Dairl Ponder Referring Physician: Physician/Extender: Durenda Hurt in Treatment: 1 Education Assessment Education Provided To: Patient Education Topics Provided Wound/Skin Impairment: Methods: Explain/Verbal Responses: State content correctly Electronic Signature(s) Signed: 08/12/2019 5:16:33 PM By: Carlene Coria RN Entered By: Carlene Coria on 08/11/2019 14:13:49 -------------------------------------------------------------------------------- Wound Assessment Details Patient Name: Date of Service: CAYDANCE, KUEHNLE 08/11/2019 2:15 PM Medical Record [id number] Patient Account Number: [id number] Date of Birth/Sex: Treating RN: 1945/10/16 (74 y.o. Female) Carlene Coria Primary Care Nargis Abrams: [name] Other Clinician: Referring Cannan Beeck: Treating Richard Ritchey/Extender:Robson, Quincy Carnes, [place] Weeks in Treatment: 1 Wound Status Wound Number: 1 Primary Pressure Ulcer Etiology: Wound Location: Right Gluteus Wound Open Wounding Event: Pressure Injury Status: Date Acquired: 07/04/2019 Comorbid Cataracts, Anemia, Asthma, Arrhythmia, Weeks Of Treatment: 1 History: Congestive Heart Failure, Coronary Artery Clustered Wound: No Disease, Hypertension, Type II Diabetes, Osteoarthritis, Neuropathy, Received Chemotherapy, Received Radiation, Confinement Anxiety Photos Wound Measurements Length: (cm) 0.5 % Reduction in Width: (cm) 0.8 % Reduction in Depth: (cm) 0.7 Epithelializat Area: (cm) 0.314 Tunneling: Volume: (cm) 0.22 Undermining: Starting Po Ending Posi Maximum Dis Area: 33.3% Volume: -134% ion: Small (1-33%) No Yes sition (o'clock): 3 tion (o'clock): 6 tance: (cm) 0.5 Wound Description Classification: Category/Stage III Foul Odor Afte Wound Margin: Flat and Intact  Slough/Fibrino Exudate Amount: Small Exudate Type: Serous Exudate Color: amber Wound Bed Granulation Amount: Small (1-33%) Granulation Quality: Pink, Pale Fascia Exposed Necrotic Amount: Large (67-100%) Fat Layer (Sub Necrotic Quality: Adherent Slough Tendon  Exposed Muscle Exp Joint Expo Bone Expos r Cleansing: No Yes Exposed Structure : No cutaneous Tissue) Exposed: Yes : No osed: No sed: No ed: No Electronic Signature(s) Signed: 08/13/2019 3:59:23 PM By: Mikeal Hawthorne EMT/HBOT Signed: 08/13/2019 5:55:28 PM By: Carlene Coria RN Previous Signature: 08/12/2019 5:16:33 PM Version By: Carlene Coria RN Previous Signature: 08/12/2019 5:54:02 PM Version By: Levan Hurst RN, BSN Entered By: Mikeal Hawthorne on 08/13/2019 11:32:10 -------------------------------------------------------------------------------- Vitals Details Patient Name: Date of Service: Hannah [name]. 08/11/2019 2:15 PM Medical Record [id number] Patient Account Number: [id number] Date of Birth/Sex: Treating RN: 1946/02/04 (74 y.o. Female) Carlene Coria Primary Care Elic Vencill: [name] Other Clinician: Referring Wandell Scullion: Treating Cydne Grahn/Extender:Robson, Quincy Carnes, [place] Weeks in Treatment: 1 Vital Signs Time Taken: 14:27 Temperature (F): 98.3 Height (in): 64 Pulse (bpm): 63 Weight (lbs): 145 Respiratory Rate (breaths/min): 18 Body Mass Index (BMI): 24.9 Blood Pressure (mmHg): 159/52 Capillary Blood Glucose (mg/dl): 120 Reference Range: 80 - 120 mg / dl Electronic Signature(s) Signed: 09/23/2019 9:21:08 AM By: Sandre Kitty Entered By: Sandre Kitty on 08/11/2019 14:29:29

## 2019-09-23 NOTE — Progress Notes (Signed)
Hannah [name], Hannah [name] ([id number]) Visit Report for 09/01/2019 Arrival Information Details Patient Name: Date of Service: Hannah [name], Hannah [name] 09/01/2019 4:00 PM Medical Record [id number] Patient Account Number: [id number] Date of Birth/Sex: Treating RN: 10-06-1945 (74 y.o. Female) Levan Hurst Primary Care Suhaylah Wampole: [name] Other Clinician: Referring Lainey Nelson: Treating Glorianna Gott/Extender:Robson, Quincy Carnes, [place] Weeks in Treatment: 4 Visit Information History Since Last Visit Added or deleted any medications: No Patient Arrived: Ambulatory Any new allergies or adverse reactions: No Arrival Time: 16:51 Had a fall or experienced change in No Accompanied By: husband activities of daily living that may affect Transfer Assistance: None risk of falls: Patient Identification Verified: Yes Signs or symptoms of abuse/neglect since last No Secondary Verification Process Completed: Yes visito Patient Requires Transmission-Based No Hospitalized since last visit: No Precautions: Implantable device outside of the clinic excluding No Patient Has Alerts: No cellular tissue based products placed in the center since last visit: Has Dressing in Place as Prescribed: Yes Pain Present Now: No Electronic Signature(s) Signed: 09/23/2019 9:02:47 AM By: Levan Hurst RN, BSN Entered By: Levan Hurst on 09/01/2019 16:51:17 -------------------------------------------------------------------------------- Encounter Discharge Information Details Patient Name: Date of Service: Hannah Dykes. 09/01/2019 4:00 PM Medical Record [id number] Patient Account Number: [id number] Date of Birth/Sex: Treating RN: Aug 06, 1945 (74 y.o. Female) Kela Millin Primary Care Mikiala Fugett: [name] Other Clinician: Referring Willaim Mode: Treating Mallissa Lorenzen/Extender:Robson, Quincy Carnes, [place] Weeks in Treatment: 4 Encounter Discharge Information Items Post Procedure Vitals Discharge Condition:  Stable Temperature (F): 98.5 Ambulatory Status: Ambulatory Pulse (bpm): 55 Discharge Destination: Home Respiratory Rate (breaths/min): 16 Transportation: Private Auto Blood Pressure (mmHg): 109/39 Accompanied By: husband Schedule Follow-up Appointment: Yes Clinical Summary of Care: Patient Declined Electronic Signature(s) Signed: 09/01/2019 5:48:19 PM By: Kela Millin Entered By: Kela Millin on 09/01/2019 17:45:06 -------------------------------------------------------------------------------- Multi Wound Chart Details Patient Name: Date of Service: Hannah Dykes. 09/01/2019 4:00 PM Medical Record [phone] Patient Account Number: [id number] Date of Birth/Sex: Treating RN: 1945-12-26 (74 y.o. Female) E[place], [place] Primary Care Arpi Diebold: [name] Other Clinician: Referring Nylan Nevel: Treating Gavyn Ybarra/Extender:Robson, Quincy Carnes, [place] Weeks in Treatment: 4 Vital Signs Height(in): 64 Capillary Blood 118 Glucose(mg/dl): Weight(lbs): 145 Pulse(bpm): 3 Body Mass Index(BMI): 25 Blood Pressure(mmHg): 109/39 Temperature(F): 98.5 Respiratory 16 Rate(breaths/min): Photos: [1:No Photos] [N/A:N/A] Wound Location: [1:Right Gluteus] [N/A:N/A] Wounding Event: [1:Pressure Injury] [N/A:N/A] Primary Etiology: [1:Pressure Ulcer] [N/A:N/A] Comorbid History: [1:Cataracts, Anemia, Asthma, N/A Arrhythmia, Congestive Heart Failure, Coronary Artery Disease, Hypertension, Type II Diabetes, Osteoarthritis, Neuropathy, Received Chemotherapy, Received Radiation, Confinement Anxiety] Date Acquired: [1:07/04/2019] [N/A:N/A] Weeks of Treatment: [1:4] [N/A:N/A] Wound Status: [1:Open] [N/A:N/A] Measurements L x W x D 0.5x0.5x0.3 [N/A:N/A] (cm) Area (cm) : [1:0.196] [N/A:N/A] Volume (cm) : [1:0.059] [N/A:N/A] % Reduction in Area: [1:58.40%] [N/A:N/A] % Reduction in Volume: 37.20% [N/A:N/A] Classification: [1:Category/Stage III] [N/A:N/A] Exudate Amount: [1:Medium]  [N/A:N/A] Exudate Type: [1:Serous] [N/A:N/A] Exudate Color: [1:amber] [N/A:N/A] Wound Margin: [1:Flat and Intact] [N/A:N/A] Granulation Amount: [1:Small (1-33%)] [N/A:N/A] Granulation Quality: [1:Pink, Pale] [N/A:N/A] Necrotic Amount: [1:Large (67-100%)] [N/A:N/A] Exposed Structures: [1:Fat Layer (Subcutaneous Tissue) Exposed: Yes Fascia: No Tendon: No Muscle: No Joint: No Bone: No] [N/A:N/A] Epithelialization: [1:Small (1-33%)] [N/A:N/A] Debridement: [1:Debridement - Excisional] [N/A:N/A] Pre-procedure [1:17:16] [N/A:N/A] Verification/Time Out Taken: Pain Control: [1:Lidocaine 5% topical ointment] [N/A:N/A] Tissue Debrided: [1:Subcutaneous, Slough] [N/A:N/A] Level: [1:Skin/Subcutaneous Tissue] [N/A:N/A] Debridement Area (sq cm):0.25 [N/A:N/A] Instrument: [1:Curette] [N/A:N/A] Bleeding: [1:Minimum] [N/A:N/A] Hemostasis Achieved: [1:Pressure] [N/A:N/A] Procedural Pain: [1:0] [N/A:N/A] Post Procedural Pain: [1:0] [N/A:N/A] Debridement Treatment Procedure was tolerated [N/A:N/A] Response: [1:well] Post Debridement [1:0.5x0.5x0.3] [N/A:N/A] Measurements L x W x D (cm) Post Debridement [1:0.059] [  N/A:N/A] Volume: (cm) Post Debridement Stage: Category/Stage III [N/A:N/A N/A] Treatment Notes Electronic Signature(s) Signed: 09/01/2019 5:53:09 PM By: Linton Ham MD Signed: 09/01/2019 5:59:23 PM By: Carlene Coria RN Entered By: Linton Ham on 09/01/2019 17:37:10 -------------------------------------------------------------------------------- Multi-Disciplinary Care Plan Details Patient Name: Date of Service: Hannah Dykes. 09/01/2019 4:00 PM Medical Record [id number] Patient Account Number: [id number] Date of Birth/Sex: Treating RN: [birth year] (74 y.o. Female) Carlene Coria Primary Care Anahita Cua: [name] Other Clinician: Referring Breckyn Ticas: Treating Margaretha Mahan/Extender:Robson, Quincy Carnes, [place] Weeks in Treatment: 4 Active Inactive Wound/Skin  Impairment Nursing Diagnoses: Knowledge deficit related to ulceration/compromised skin integrity Goals: Ulcer/skin breakdown will have a volume reduction of 30% by week 4 Date Initiated: 08/04/2019 Target Resolution Date: 09/04/2019 Goal Status: Active Ulcer/skin breakdown will have a volume reduction of 50% by week 8 Date Initiated: 08/04/2019 Target Resolution Date: 09/04/2019 Goal Status: Active Interventions: Assess patient/caregiver ability to obtain necessary supplies Assess patient/caregiver ability to perform ulcer/skin care regimen upon admission and as needed Assess ulceration(s) every visit Notes: Electronic Signature(s) Signed: 09/01/2019 5:59:23 PM By: Carlene Coria RN Entered By: Carlene Coria on 09/01/2019 16:41:18 -------------------------------------------------------------------------------- Pain Assessment Details Patient Name: Date of Service: Hannah [name], Hannah [name] 09/01/2019 4:00 PM Medical Record [id number] Patient Account Number: [id number] Date of Birth/Sex: Treating RN: 1946-03-11 (74 y.o. Female) Levan Hurst Primary Care Bonnita Newby: [name] Other Clinician: Referring Zaniah Titterington: Treating Canuto Kingston/Extender:Robson, Quincy Carnes, [place] Weeks in Treatment: 4 Active Problems Location of Pain Severity and Description of Pain Patient Has Paino No Site Locations Pain Management and Medication Current Pain Management: Electronic Signature(s) Signed: 09/23/2019 9:02:47 AM By: Levan Hurst RN, BSN Entered By: Levan Hurst on 09/01/2019 16:51:59 -------------------------------------------------------------------------------- Patient/Caregiver Education Details Patient Name: Hannah Dykes 3/23/2021andnbsp4:00 Date of Service: PM Medical Record [id number] Number: Patient Account Number: [id number] Treating RN: May 22, 1946 (74 y.o. Carlene Coria Date of Birth/Gender: Female) Other Clinician: Primary Care Physician:SKAKLE, [place] Treating Linton Ham Referring Physician: Physician/Extender: Durenda Hurt in Treatment: 4 Education Assessment Education Provided To: Patient Education Topics Provided Wound/Skin Impairment: Methods: Explain/Verbal Responses: State content correctly Electronic Signature(s) Signed: 09/01/2019 5:59:23 PM By: Carlene Coria RN Entered By: Carlene Coria on 09/01/2019 16:41:32 -------------------------------------------------------------------------------- Wound Assessment Details Patient Name: Date of Service: Hannah [name], Hannah [name] 09/01/2019 4:00 PM Medical Record [id number] Patient Account Number: [id number] Date of Birth/Sex: Treating RN: [birth year] (74 y.o. Female) Levan Hurst Primary Care Cullin Dishman: [name] Other Clinician: Referring Reilly Molchan: Treating Samiel Peel/Extender:Robson, Quincy Carnes, [place] Weeks in Treatment: 4 Wound Status Wound Number: 1 Primary Pressure Ulcer Etiology: Wound Location: Right Gluteus Wound Open Wounding Event: Pressure Injury Status: Date Acquired: 07/04/2019 Comorbid Cataracts, Anemia, Asthma, Arrhythmia, Weeks Of Treatment: 4 History: Congestive Heart Failure, Coronary Artery Clustered Wound: No Disease, Hypertension, Type II Diabetes, Osteoarthritis, Neuropathy, Received Chemotherapy, Received Radiation, Confinement Anxiety Photos Photo Uploaded By: Mikeal Hawthorne on 09/03/2019 14:44:31 Wound Measurements Length: (cm) 0.5 % Reduction Width: (cm) 0.5 % Reduction Depth: (cm) 0.3 Epithelializ Area: (cm) 0.196 Tunneling: Volume: (cm) 0.059 Undermining Wound Description Classification: Category/Stage III Foul Odor A Wound Margin: Flat and Intact Slough/Fibr Exudate Amount: Medium Exudate Type: Serous Exudate Color: amber Wound Bed Granulation Amount: Small (1-33%) Granulation Quality: Pink, Pale Fascia Expo Necrotic Amount: Large (67-100%) Fat Layer ( Necrotic Quality: Adherent Slough Tendon Expo Muscle Expo Joint  Expos Bone Expose Electronic Signature(s) Signed: 09/23/2019 9:02:47 AM By: Levan Hurst RN, BSN Entered By: Levan Hurst on 03/23/2 fter Cleansing: No ino Yes Exposed Structure sed: No Subcutaneous Tissue) Exposed: Yes sed: No sed: No ed: No d: No  021 16:52:28 in Area: 58.4% in Volume: 37.2% ation: Small (1-33%) No : No -------------------------------------------------------------------------------- Vitals Details Patient Name: Date of Service: Hannah [name], Hannah [name] 09/01/2019 4:00 PM Medical Record [id number] Patient Account Number: [id number] Date of Birth/Sex: Treating RN: 09/15/1945 (74 y.o. Female) Levan Hurst Primary Care Niaya Hickok: [name] Other Clinician: Referring Yeslin Delio: Treating Krizia Flight/Extender:Robson, Quincy Carnes, [place] Weeks in Treatment: 4 Vital Signs Time Taken: 16:51 Temperature (F): 98.5 Height (in): 64 Pulse (bpm): 55 Weight (lbs): 145 Respiratory Rate (breaths/min): 16 Body Mass Index (BMI): 24.9 Blood Pressure (mmHg): 109/39 Capillary Blood Glucose (mg/dl): 118 Reference Range: 80 - 120 mg / dl Notes glucose per pt report Electronic Signature(s) Signed: 09/23/2019 9:02:47 AM By: Levan Hurst RN, BSN Entered By: Levan Hurst on 09/01/2019 16:51:54

## 2019-09-23 NOTE — Progress Notes (Signed)
Hannah [name], Hannah [name] ([id number]) Visit Report for 09/15/2019 Arrival Information Details Patient Name: Date of Service: Hannah [name], Hannah [name] 09/15/2019 2:00 PM Medical Record [id number] Patient Account Number: [id number] Date of Birth/Sex: Treating RN: 11-Jun-1946 (74 y.o. Female) Carlene Coria Primary Care Tameem Pullara: [name] Other Clinician: Referring Estiven Kohan: Treating Laren Orama/Extender:Robson, Quincy Carnes, [place] Weeks in Treatment: 6 Visit Information History Since Last Visit Added or deleted any medications: No Patient Arrived: Ambulatory Any new allergies or adverse reactions: No Arrival Time: 14:29 Had a fall or experienced change in No Accompanied By: husband activities of daily living that may affect Transfer Assistance: None risk of falls: Patient Identification Verified: Yes Signs or symptoms of abuse/neglect since last No Secondary Verification Process Completed: Yes visito Patient Requires Transmission-Based No Hospitalized since last visit: No Precautions: Implantable device outside of the clinic excluding No Patient Has Alerts: No cellular tissue based products placed in the center since last visit: Has Dressing in Place as Prescribed: Yes Pain Present Now: No Electronic Signature(s) Signed: 09/23/2019 9:20:10 AM By: Sandre Kitty Entered By: Sandre Kitty on 09/15/2019 14:33:42 -------------------------------------------------------------------------------- Encounter Discharge Information Details Patient Name: Date of Service: Hannah [name]. 09/15/2019 2:00 PM Medical Record [id number] Patient Account Number: [id number] Date of Birth/Sex: Treating RN: [birth year] (74 y.o. Female) Kela Millin Primary Care Ellin Fitzgibbons: [name] Other Clinician: Referring Kile Kabler: Treating Stan Cantave/Extender:Robson, Quincy Carnes, [place] Weeks in Treatment: 6 Encounter Discharge Information Items Post Procedure Vitals Discharge Condition:  Stable Temperature (F): 98.5 Ambulatory Status: Ambulatory Pulse (bpm): 50 Discharge Destination: Home Respiratory Rate (breaths/min): 18 Transportation: Private Auto Blood Pressure (mmHg): 116/42 Accompanied By: husband Schedule Follow-up Appointment: Yes Clinical Summary of Care: Patient Declined Electronic Signature(s) Signed: 09/15/2019 6:07:04 PM By: Kela Millin Entered By: Kela Millin on 09/15/2019 15:27:17 -------------------------------------------------------------------------------- Lower Extremity Assessment Details Patient Name: Date of Service: Hannah [name]. 09/15/2019 2:00 PM Medical Record [id number] Patient Account Number: [id number] Date of Birth/Sex: Treating RN: 06-Oct-1945 (74 y.o. Female) Carlene Coria Primary Care Zurri Rudden: [name] Other Clinician: Referring Yasser Hepp: Treating Kennice Finnie/Extender:Robson, Quincy Carnes, [place] Weeks in Treatment: 6 Electronic Signature(s) Signed: 09/15/2019 6:43:44 PM By: Carlene Coria RN Signed: 09/23/2019 9:20:10 AM By: Sandre Kitty Entered By: Sandre Kitty on 09/15/2019 14:37:25 -------------------------------------------------------------------------------- Multi Wound Chart Details Patient Name: Date of Service: Hannah [name]. 09/15/2019 2:00 PM Medical Record [id number] Patient Account Number: [id number] Date of Birth/Sex: Treating RN: 19-Jan-1946 (74 y.o. Female) E[place], [place] Primary Care Leonce Bale: [name] Other Clinician: Referring Trae Bovenzi: Treating Demitri Kucinski/Extender:Robson, Quincy Carnes, [place] Weeks in Treatment: 6 Vital Signs Height(in): 64 Capillary Blood 123 Glucose(mg/dl): Weight(lbs): 145 Pulse(bpm): 50 Body Mass Index(BMI): 25 Blood Pressure(mmHg): 116/42 Temperature(F): 98.5 Respiratory 18 Rate(breaths/min): Photos: [1:No Photos] [N/A:N/A] Wound Location: [1:Right Gluteus] [N/A:N/A] Wounding Event: [1:Pressure Injury] [N/A:N/A] Primary Etiology:  [1:Pressure Ulcer] [N/A:N/A N/A] Comorbid History: [1:Cataracts, Anemia, Asthma, N/A Arrhythmia, Congestive Heart Failure, Coronary Artery Disease, Hypertension, Type II Diabetes, Osteoarthritis, Neuropathy, Received Chemotherapy, Received Radiation, Confinement Anxiety] [N/A:N/A] Date Acquired: [1:07/04/2019] [N/A:N/A N/A] Weeks of Treatment: [1:6] [N/A:N/A N/A] Wound Status: [1:Open] [N/A:N/A N/A] Measurements L x W x D 0.3x0.5x0.5 [N/A:N/A N/A] (cm) Area (cm) : [1:0.118] [N/A:N/A N/A] Volume (cm) : [1:0.059] [N/A:N/A N/A] % Reduction in Area: [1:74.90%] [N/A:N/A N/A] % Reduction in Volume: 37.20% [N/A:N/A N/A] Classification: [1:Category/Stage III] [N/A:N/A N/A] Exudate Amount: [1:Medium] [N/A:N/A N/A] Exudate Type: [1:Serous] [N/A:N/A N/A] Exudate Color: [1:amber] [N/A:N/A N/A] Wound Margin: [1:Flat and Intact] [N/A:N/A N/A] Granulation Amount: [1:None Present (0%)] [N/A:N/A N/A] Necrotic Amount: [1:Large (67-100%)] [N/A:N/A N/A] Exposed Structures: [1:Fat Layer (Subcutaneous N/A Tissue) Exposed: Yes  Fascia: No Tendon: No Muscle: No Joint: No Bone: No] [N/A:N/A] Epithelialization: [1:Small (1-33%)] [N/A:N/A N/A] Debridement: [1:Debridement - Excisional N/A] [N/A:N/A] Pre-procedure [1:15:10] [N/A:N/A N/A] Verification/Time Out Taken: Pain Control: [1:Lidocaine 5% topical ointment] [N/A:N/A N/A] Tissue Debrided: [1:Subcutaneous, Slough] [N/A:N/A N/A] Level: [1:Skin/Subcutaneous Tissue N/A] [N/A:N/A] Debridement Area (sq cm):0.15 [N/A:N/A N/A] Instrument: [1:Curette] [N/A:N/A N/A] Bleeding: [1:Minimum] [N/A:N/A N/A] Hemostasis Achieved: [1:Pressure] [N/A:N/A N/A] Procedural Pain: [1:4] [N/A:N/A N/A] Post Procedural Pain: [1:0] [N/A:N/A N/A] Debridement Treatment Procedure was tolerated [N/A:N/A N/A] Response: [1:well] Post Debridement [1:0.3x0.5x0.5] [N/A:N/A N/A] Measurements L x W x D (cm) Post Debridement [1:0.059] [N/A:N/A N/A] Volume: (cm) Post Debridement Stage:  Category/Stage III [N/A:N/A N/A N/A N/A] Treatment Notes Wound #1 (Right Gluteus) 1. Cleanse With Wound Cleanser 2. Periwound Care Skin Prep 3. Primary Dressing Applied Santyl 4. Secondary Dressing Foam Border Dressing Electronic Signature(s) Signed: 09/15/2019 6:13:52 PM By: Linton Ham MD Signed: 09/15/2019 6:43:44 PM By: Carlene Coria RN Entered By: Linton Ham on 09/15/2019 15:47:40 -------------------------------------------------------------------------------- Multi-Disciplinary Care Plan Details Patient Name: Date of Service: Hannah [name]. 09/15/2019 2:00 PM Medical Record [id number] Patient Account Number: [id number] Date of Birth/Sex: Treating RN: 03-14-1946 (74 y.o. Female) Carlene Coria Primary Care Nikkita Adeyemi: [name] Other Clinician: Referring Barry Faircloth: Treating Sheridyn Canino/Extender:Robson, Quincy Carnes, [place] Weeks in Treatment: 6 Active Inactive Wound/Skin Impairment Nursing Diagnoses: Knowledge deficit related to ulceration/compromised skin integrity Goals: Ulcer/skin breakdown will have a volume reduction of 30% by week 4 Date Initiated: 08/04/2019 Date Inactivated: 09/15/2019 Target Resolution Date: 09/04/2019 Goal Status: Unmet Unmet Reason: comorbities Ulcer/skin breakdown will have a volume reduction of 50% by week 8 Date Initiated: 08/04/2019 Date Inactivated: 09/15/2019 Target Resolution Date: 09/04/2019 Goal Status: Unmet Unmet Reason: comorbites Ulcer/skin breakdown will have a volume reduction of 80% by week 12 Date Initiated: 09/15/2019 Target Resolution Date: 10/09/2019 Goal Status: Active Interventions: Assess patient/caregiver ability to obtain necessary supplies Assess patient/caregiver ability to perform ulcer/skin care regimen upon admission and as needed Assess ulceration(s) every visit Notes: Electronic Signature(s) Signed: 09/15/2019 6:43:44 PM By: Carlene Coria RN Entered By: Carlene Coria on 09/15/2019  15:12:06 -------------------------------------------------------------------------------- Pain Assessment Details Patient Name: Date of Service: MADELLINE, ESHBACH 09/15/2019 2:00 PM Medical Record [id number] Patient Account Number: [id number] Date of Birth/Sex: Treating RN: 14-Feb-1946 (74 y.o. Female) Carlene Coria Primary Care Anderson Coppock: [name] Other Clinician: Referring Dmetrius Ambs: Treating Teondra Newburg/Extender:Robson, Quincy Carnes, [place] Weeks in Treatment: 6 Active Problems Location of Pain Severity and Description of Pain Patient Has Paino No Site Locations Pain Management and Medication Current Pain Management: Electronic Signature(s) Signed: 09/15/2019 6:43:44 PM By: Carlene Coria RN Signed: 09/23/2019 9:20:10 AM By: Sandre Kitty Entered By: Sandre Kitty on 09/15/2019 14:33:56 -------------------------------------------------------------------------------- Patient/Caregiver Education Details Patient Name: Hannah [name] 4/6/2021andnbsp2:00 Date of Service: PM Medical Record [id number] Number: Patient Account Number: [id number] Treating RN: Date of Birth/Gender: 07/06/1945 (74 y.o. Epps, [place] Female) Date of Birth/Gender: Female) Other Clinician: Primary Care Physician: [name] Treating Linton Ham Referring Physician: Physician/Extender: Durenda Hurt in Treatment: 6 Education Assessment Education Provided To: Patient Education Topics Provided Wound/Skin Impairment: Methods: Explain/Verbal Responses: State content correctly Electronic Signature(s) Signed: 09/15/2019 6:43:44 PM By: Carlene Coria RN Entered By: Carlene Coria on 09/15/2019 15:12:34 -------------------------------------------------------------------------------- Wound Assessment Details Patient Name: Date of Service: SHAMAYA, KAUER 09/15/2019 2:00 PM Medical Record [id number] Patient Account Number: [id number] Date of Birth/Sex: Treating RN: 20-Apr-1946 (74  y.o. Female) Carlene Coria Primary Care Sudiksha Victor: [name] Other Clinician: Referring Deniese Oberry: Treating Nassir Neidert/Extender:Robson, Quincy Carnes, [place] Weeks in Treatment: 6 Wound Status Wound Number:  1 Primary Pressure Ulcer Etiology: Wound Location: Right Gluteus Wound Open Wounding Event: Pressure Injury Status: Date Acquired: 07/04/2019 Comorbid Cataracts, Anemia, Asthma, Arrhythmia, Weeks Of Treatment: 6 History: Congestive Heart Failure, Coronary Artery Clustered Wound: No Disease, Hypertension, Type II Diabetes, Osteoarthritis, Neuropathy, Received Chemotherapy, Received Radiation, Confinement Anxiety Wound Measurements Length: (cm) 0.3 Width: (cm) 0.5 Depth: (cm) 0.5 Area: (cm) 0.118 Volume: (cm) 0.059 Wound Description Classification: Category/Stage III Wound Margin: Flat and Intact Exudate Amount: Medium Exudate Type: Serous Exudate Color: amber Wound Bed Granulation Amount: None Present (0%) Necrotic Amount: Large (67-100%) Necrotic Quality: Adherent Slough or After Cleansing: No Fibrino Yes Exposed Structure sed: No Subcutaneous Tissue) Exposed: Yes sed: No sed: No ed: No d: No % Reduction in Area: 74.9% % Reduction in Volume: 37.2% Epithelialization: Small (1-33%) Tunneling: No Undermining: No Foul Od Slough/ Fascia Expo Fat Layer ( Tendon Expo Muscle Expo Joint Expos Bone Expose Treatment Notes Wound #1 (Right Gluteus) 1. Cleanse With Wound Cleanser 2. Periwound Care Skin Prep 3. Primary Dressing Applied Santyl 4. Secondary Dressing Foam Border Dressing Electronic Signature(s) Signed: 09/15/2019 6:43:44 PM By: Carlene Coria RN Signed: 09/21/2019 6:12:10 PM By: Levan Hurst RN, BSN Entered By: Levan Hurst on 09/15/2019 14:55:08 -------------------------------------------------------------------------------- [place] Details Patient Name: Date of Service: Hannah [name]. 09/15/2019 2:00 PM Medical Record  [id number] Patient Account Number: [id number] Date of Birth/Sex: Treating RN: April 05, 1946 (74 y.o. Female) Carlene Coria Primary Care Azion Centrella: [name] Other Clinician: Referring Davarious Tumbleson: Treating Trevyon Swor/Extender:Robson, Quincy Carnes, [place] Weeks in Treatment: 6 Vital Signs Time Taken: 14:34 Temperature (F): 98.5 Height (in): 64 Pulse (bpm): 50 Weight (lbs): 145 Respiratory Rate (breaths/min): 18 Body Mass Index (BMI): 24.9 Blood Pressure (mmHg): 116/42 Capillary Blood Glucose (mg/dl): 123 Reference Range: 80 - 120 mg / dl Electronic Signature(s) Signed: 09/23/2019 9:20:10 AM By: Sandre Kitty Entered By: Sandre Kitty on 09/15/2019 14:37:19

## 2019-09-24 ENCOUNTER — Inpatient Hospital Stay: Payer: Medicare Other

## 2019-09-24 ENCOUNTER — Other Ambulatory Visit: Payer: Medicare Other

## 2019-09-24 ENCOUNTER — Encounter: Payer: Self-pay | Admitting: Hematology and Oncology

## 2019-09-24 ENCOUNTER — Other Ambulatory Visit: Payer: Self-pay

## 2019-09-24 ENCOUNTER — Ambulatory Visit (INDEPENDENT_AMBULATORY_CARE_PROVIDER_SITE_OTHER): Payer: Medicare Other | Admitting: *Deleted

## 2019-09-24 VITALS — BP 120/68 | HR 60 | Temp 98.2°F | Resp 18

## 2019-09-24 DIAGNOSIS — R55 Syncope and collapse: Secondary | ICD-10-CM

## 2019-09-24 DIAGNOSIS — D638 Anemia in other chronic diseases classified elsewhere: Secondary | ICD-10-CM

## 2019-09-24 DIAGNOSIS — I129 Hypertensive chronic kidney disease with stage 1 through stage 4 chronic kidney disease, or unspecified chronic kidney disease: Secondary | ICD-10-CM | POA: Diagnosis not present

## 2019-09-24 LAB — CUP PACEART REMOTE DEVICE CHECK
Date Time Interrogation Session: 20210415123752
Implantable Pulse Generator Implant Date: 20190322

## 2019-09-24 LAB — CBC WITH DIFFERENTIAL (CANCER CENTER ONLY)
Abs Immature Granulocytes: 0.01 10*3/uL (ref 0.00–0.07)
Basophils Absolute: 0 10*3/uL (ref 0.0–0.1)
Basophils Relative: 1 %
Eosinophils Absolute: 0.2 10*3/uL (ref 0.0–0.5)
Eosinophils Relative: 4 %
HCT: 31.3 % — ABNORMAL LOW (ref 36.0–46.0)
Hemoglobin: 8.8 g/dL — ABNORMAL LOW (ref 12.0–15.0)
Immature Granulocytes: 0 %
Lymphocytes Relative: 10 %
Lymphs Abs: 0.5 10*3/uL — ABNORMAL LOW (ref 0.7–4.0)
MCH: 27.5 pg (ref 26.0–34.0)
MCHC: 28.1 g/dL — ABNORMAL LOW (ref 30.0–36.0)
MCV: 97.8 fL (ref 80.0–100.0)
Monocytes Absolute: 0.7 10*3/uL (ref 0.1–1.0)
Monocytes Relative: 13 %
Neutro Abs: 3.7 10*3/uL (ref 1.7–7.7)
Neutrophils Relative %: 72 %
Platelet Count: 105 10*3/uL — ABNORMAL LOW (ref 150–400)
RBC: 3.2 MIL/uL — ABNORMAL LOW (ref 3.87–5.11)
RDW: 14.9 % (ref 11.5–15.5)
WBC Count: 5.2 10*3/uL (ref 4.0–10.5)
nRBC: 0 % (ref 0.0–0.2)

## 2019-09-24 LAB — SAMPLE TO BLOOD BANK

## 2019-09-24 MED ORDER — EPOETIN ALFA-EPBX 40000 UNIT/ML IJ SOLN
40000.0000 [IU] | Freq: Once | INTRAMUSCULAR | Status: AC
  Administered 2019-09-24: 40000 [IU] via SUBCUTANEOUS

## 2019-09-24 MED ORDER — EPOETIN ALFA-EPBX 40000 UNIT/ML IJ SOLN
INTRAMUSCULAR | Status: AC
  Filled 2019-09-24: qty 1

## 2019-09-25 ENCOUNTER — Ambulatory Visit: Payer: Medicare Other

## 2019-09-25 ENCOUNTER — Inpatient Hospital Stay: Payer: Medicare Other

## 2019-09-25 ENCOUNTER — Other Ambulatory Visit: Payer: Medicare Other

## 2019-09-25 NOTE — Progress Notes (Signed)
ILR Remote 

## 2019-09-29 ENCOUNTER — Encounter (HOSPITAL_BASED_OUTPATIENT_CLINIC_OR_DEPARTMENT_OTHER): Payer: Medicare Other | Admitting: Internal Medicine

## 2019-09-29 ENCOUNTER — Other Ambulatory Visit: Payer: Self-pay

## 2019-09-29 DIAGNOSIS — E11621 Type 2 diabetes mellitus with foot ulcer: Secondary | ICD-10-CM | POA: Diagnosis not present

## 2019-09-29 NOTE — Progress Notes (Signed)
PRITI, CONSOLI ([id number]) Visit Report for 09/29/2019 HPI Details Patient Name: Date of Service: Hannah [name], Hannah [name] 09/29/2019 9:30 AM Medical Record [id number] Patient Account Number: [id number] Date of Birth/Sex: Treating RN: 1946/03/27 (74 y.o. Orvan Falconer Primary Care Provider: [name] Other Clinician: Referring Provider: Treating Provider/Extender:Addy Mcmannis, Quincy Carnes, Marcelle Smiling in Treatment: 8 History of Present Illness HPI Description: ADMISSION 08/04/2019 This is a pleasant 74 year old woman who is accompanied by her husband. She has had a lot of ongoing medical issues recently some of which are related to right breast cancer status post right lumpectomy and chemotherapy. She also had a right pleural effusion and had a wound VAC placed but is now status post pleurodesis on 06/26/2019. She also has had iron deficiency anemia I think of uncertain etiology but she has been receiving IV iron at hematology. About a month ago she noted pain on her upper right buttock. She had her husband finally have a look at this and she had a pressure ulcer. She went to see her primary doctor who told her to keep this covered and dry. Past medical history; type 2 diabetes, coronary artery disease status post CABG in 1998 with a redo in 2009 she has had a bioprosthetic prosthetic aortic valve replacement, paroxysmal atrial fibrillation, diastolic heart failure, right breast cancer status post lumpectomy and chemotherapy, residual seroma in the right breast, right pleural effusion status post drain and pleurodesis, iron deficiency anemia, hypertension and orthostatic hypotension, asthma, nephrolithiasis 3/2; the patient readmitted to the clinic last week. She has a small but punched out wound in the upper right buttock and close proximity to the gluteal cleft. We use silver collagen. She has home health and she has a daughter-in-law who is an LPN is changing this as well every  second day. 3/9; the patient was admitted to the clinic 2 weeks ago. She had a small pressure ulcer on the upper right buttock. We have been using silver collagen. Unfortunately she comes back in with very adherent debris over the surface of the wound 3/16; small wound on the right buttock. Absolutely no better. Changed to Iodoflex last time still the same adherent debris 3/23; small wound on the right buttock somewhat smaller but still with an inherent fibrinous surface. We have been using Iodoflex. 4/6; small wound on the right buttock. Absolutely no change. She still has the same fibrinous base to this which is very difficult to debride. We have been using Iodoflex, I will change her to Santyl change daily. 4/20; small wound on the right buttock. We prescribed Santyl last time. She arrives today with a surface over the wound area although I hesitate to see this is healed. She is not complaining of pain Electronic Signature(s) Signed: 09/29/2019 5:47:38 PM By: Linton Ham MD Entered By: Linton Ham on 09/29/2019 11:23:11 -------------------------------------------------------------------------------- Physical Exam Details Patient Name: Date of Service: Hannah [name]. 09/29/2019 9:30 AM Medical Record [phone] Patient Account Number: [id number] Date of Birth/Sex: Treating RN: [birth year] (74 y.o. Orvan Falconer Primary Care Provider: [name] Other Clinician: Referring Provider: Treating Provider/Extender:Chalyn Amescua, Quincy Carnes, [place] Weeks in Treatment: 8 Constitutional Patient is hypertensive.. Pulse regular and within target range for patient.Marland Kitchen Respirations regular, non-labored and within target range.. Temperature is normal and within the target range for the patient.Marland Kitchen Appears in no distress. Notes Wound exam; the areas covered by tissue however is actually raised and has a small nodular feel to it. There is no tenderness no drainage no purulence. I found  this  so odd I was going to try to open some of the surface to make sure there was an underlying wound or drainage however the patient did not really want me to do this. There was no surrounding erythema Electronic Signature(s) Signed: 09/29/2019 5:47:38 PM By: Linton Ham MD Entered By: Linton Ham on 09/29/2019 11:24:08 -------------------------------------------------------------------------------- Physician Orders Details Patient Name: Date of Service: Hannah [name]. 09/29/2019 9:30 AM Medical Record [id number] Patient Account Number: [id number] Date of Birth/Sex: Treating RN: 03-20-1946 (74 y.o. Orvan Falconer Primary Care Provider: [name] Other Clinician: Referring Provider: Treating Provider/Extender:Caidence Kaseman, Quincy Carnes, Marcelle Smiling in Treatment: 8 Verbal / Phone Orders: No Diagnosis Coding ICD-10 Coding Code Description L89.313 Pressure ulcer of right buttock, stage 3 C50.211 Malignant neoplasm of upper-inner quadrant of right female breast Follow-up Appointments Return appointment in 1 month. Dressing Change Frequency Change dressing every day. Secondary Dressing Foam Border Electronic Signature(s) Signed: 09/29/2019 5:39:56 PM By: Carlene Coria RN Signed: 09/29/2019 5:47:38 PM By: Linton Ham MD Entered By: Carlene Coria on 09/29/2019 11:04:44 -------------------------------------------------------------------------------- Problem List Details Patient Name: Date of Service: Hannah [name]. 09/29/2019 9:30 AM Medical Record [id number] Patient Account Number: [id number] Date of Birth/Sex: Treating RN: 05-Oct-1945 (74 y.o. Orvan Falconer Primary Care Provider: [name] Other Clinician: Referring Provider: Treating Provider/Extender:Jazen Spraggins, Quincy Carnes, [place] Weeks in Treatment: 8 Active Problems ICD-10 Evaluated Encounter Code Description Active Date Today Diagnosis L89.313 Pressure ulcer of right buttock, stage 3  08/04/2019 No Yes C50.211 Malignant neoplasm of upper-inner quadrant of right 08/04/2019 No Yes female breast Inactive Problems Resolved Problems Electronic Signature(s) Signed: 09/29/2019 5:47:38 PM By: Linton Ham MD Entered By: Linton Ham on 09/29/2019 11:22:25 -------------------------------------------------------------------------------- Progress Note Details Patient Name: Date of Service: Hannah [name]. 09/29/2019 9:30 AM Medical Record [id number] [phone] Patient Account Number: [id number] Date of Birth/Sex: Treating RN: [birth year] (74 y.o. Orvan Falconer Primary Care Provider: [name] Other Clinician: Referring Provider: Treating Provider/Extender:Allyce Bochicchio, Quincy Carnes, [place] Weeks in Treatment: 8 Subjective History of Present Illness (HPI) ADMISSION 08/04/2019 This is a pleasant 74 year old woman who is accompanied by her husband. She has had a lot of ongoing medical issues recently some of which are related to right breast cancer status post right lumpectomy and chemotherapy. She also had a right pleural effusion and had a wound VAC placed but is now status post pleurodesis on 06/26/2019. She also has had iron deficiency anemia I think of uncertain etiology but she has been receiving IV iron at hematology. About a month ago she noted pain on her upper right buttock. She had her husband finally have a look at this and she had a pressure ulcer. She went to see her primary doctor who told her to keep this covered and dry. Past medical history; type 2 diabetes, coronary artery disease status post CABG in 1998 with a redo in 2009 she has had a bioprosthetic prosthetic aortic valve replacement, paroxysmal atrial fibrillation, diastolic heart failure, right breast cancer status post lumpectomy and chemotherapy, residual seroma in the right breast, right pleural effusion status post drain and pleurodesis, iron deficiency anemia, hypertension and orthostatic  hypotension, asthma, nephrolithiasis 3/2; the patient readmitted to the clinic last week. She has a small but punched out wound in the upper right buttock and close proximity to the gluteal cleft. We use silver collagen. She has home health and she has a daughter-in-law who is an LPN is changing this as well every second day. 3/9; the patient was admitted to the clinic 2 weeks ago.  She had a small pressure ulcer on the upper right buttock. We have been using silver collagen. Unfortunately she comes back in with very adherent debris over the surface of the wound 3/16; small wound on the right buttock. Absolutely no better. Changed to Iodoflex last time still the same adherent debris 3/23; small wound on the right buttock somewhat smaller but still with an inherent fibrinous surface. We have been using Iodoflex. 4/6; small wound on the right buttock. Absolutely no change. She still has the same fibrinous base to this which is very difficult to debride. We have been using Iodoflex, I will change her to Santyl change daily. 4/20; small wound on the right buttock. We prescribed Santyl last time. She arrives today with a surface over the wound area although I hesitate to see this is healed. She is not complaining of pain Objective Constitutional Patient is hypertensive.. Pulse regular and within target range for patient.Marland Kitchen Respirations regular, non-labored and within target range.. Temperature is normal and within the target range for the patient.Marland Kitchen Appears in no distress. Vitals Time Taken: 9:50 AM, Height: 64 in, Weight: 145 lbs, BMI: 24.9, Temperature: 98.3 F, Pulse: 53 bpm, Respiratory Rate: 18 breaths/min, Blood Pressure: 144/50 mmHg, Capillary Blood Glucose: 98 mg/dl. General Notes: Wound exam; the areas covered by tissue however is actually raised and has a small nodular feel to it. There is no tenderness no drainage no purulence. I found this so odd I was going to try to open some of  the surface to make sure there was an underlying wound or drainage however the patient did not really want me to do this. There was no surrounding erythema Integumentary (Hair, Skin) Wound #1 status is Healed - Epithelialized. Original cause of wound was Pressure Injury. The wound is located on the Right Gluteus. The wound measures 0cm length x 0cm width x 0cm depth; 0cm^2 area and 0cm^3 volume. There is no tunneling or undermining noted. There is a none present amount of drainage noted. There is no granulation within the wound bed. There is no necrotic tissue within the wound bed. Assessment Active Problems ICD-10 Pressure ulcer of right buttock, stage 3 Malignant neoplasm of upper-inner quadrant of right female breast Plan Follow-up Appointments: Return appointment in 1 month. Dressing Change Frequency: Change dressing every day. Secondary Dressing: Foam Border 1. Although the patient has a surface on this I am not completely certain this is healed. 2. Very odd nodular rough surface over where the wound was. In this setting I am not completely certain that this actually represents a viable surface. 3. I had wanted to remove some of this just to make sure there was not an underlying wound or drainage however the patient did not want me to do this. In lieu of this we will cover this with foam keep the pressure off and I have given her an appointment in a month to make sure that this is still closed Electronic Signature(s) Signed: 09/29/2019 5:47:38 PM By: Linton Ham MD Entered By: Linton Ham on 09/29/2019 11:25:06 -------------------------------------------------------------------------------- SuperBill Details Patient Name: Date of Service: Hannah [name] 09/29/2019 Medical Record [id number] Patient Account Number: [id number] Date of Birth/Sex: Treating RN: [birth year] (74 y.o. Orvan Falconer Primary Care Provider: [name] Other Clinician: Referring  Provider: Treating Provider/Extender:Izella Ybanez, Quincy Carnes, [place] Weeks in Treatment: 8 Diagnosis Coding ICD-10 Codes Code Description Q91.694 Pressure ulcer of right buttock, stage 3 C50.211 Malignant neoplasm of upper-inner quadrant of right female breast Facility Procedures CPT4 Code: [id number]  Description: 99213 - WOUND CARE VISIT-LEV 3 EST PT Modifier: Quantity: 1 Physician Procedures CPT4 Code: [id number] Description: 00370 - WC PHYS LEVEL 3 - EST PT ICD-10 Diagnosis Description L89.313 Pressure ulcer of right buttock, stage 3 Modifier: Quantity: 1 Electronic Signature(s) Signed: 09/29/2019 5:47:38 PM By: Linton Ham MD Entered By: Linton Ham on 09/29/2019 11:25:22

## 2019-09-29 NOTE — Progress Notes (Signed)
Hannah, [name] ([id number]) Visit Report for 09/29/2019 Arrival Information Details Patient Name: Date of Service: Hannah [name], Hannah [name] 09/29/2019 9:30 AM Medical Record [id number] Patient Account Number: [id number] Date of Birth/Sex: Treating RN: [birth year] (74 y.o. Orvan Falconer Primary Care Analie Katzman: [name] Other Clinician: Referring Lindyn Vossler: Treating Qusai Kem/Extender:Robson, Quincy Carnes, [place] Weeks in Treatment: 8 Visit Information History Since Last Visit Added or deleted any medications: No Patient Arrived: Ambulatory Any new allergies or adverse reactions: No Arrival Time: 09:47 Had a fall or experienced change in No Accompanied By: husband activities of daily living that may affect Transfer Assistance: None risk of falls: Patient Identification Verified: Yes Signs or symptoms of abuse/neglect since last No Secondary Verification Process Completed: Yes visito Patient Requires Transmission-Based No Hospitalized since last visit: No Precautions: Implantable device outside of the clinic excluding No Patient Has Alerts: No cellular tissue based products placed in the center since last visit: Has Dressing in Place as Prescribed: Yes Pain Present Now: No Electronic Signature(s) Signed: 09/29/2019 1:29:28 PM By: Sandre Kitty Entered By: Sandre Kitty on 09/29/2019 09:50:27 -------------------------------------------------------------------------------- Clinic Level of Care Assessment Details Patient Name: Date of Service: Hannah, [name] 09/29/2019 9:30 AM Medical Record [id number] Patient Account Number: [id number] Date of Birth/Sex: Treating RN: Jan 20, 1946 (74 y.o. Orvan Falconer Primary Care Noheli Melder: [name] Other Clinician: Referring Craig Wisnewski: Treating Sherissa Tenenbaum/Extender:Robson, Quincy Carnes, [place] Weeks in Treatment: 8 Clinic Level of Care Assessment Items TOOL 4 Quantity Score X - Use when only an EandM is performed  on FOLLOW-UP visit 1 0 ASSESSMENTS - Nursing Assessment / Reassessment X - Reassessment of Co-morbidities (includes updates in patient status) 1 10 X - Reassessment of Adherence to Treatment Plan 1 5 ASSESSMENTS - Wound and Skin Assessment / Reassessment X - Simple Wound Assessment / Reassessment - one wound 1 5 []  - Complex Wound Assessment / Reassessment - multiple wounds 0 []  - Dermatologic / Skin Assessment (not related to wound area) 0 ASSESSMENTS - Focused Assessment []  - Circumferential Edema Measurements - multi extremities 0 []  - Nutritional Assessment / Counseling / Intervention 0 []  - Lower Extremity Assessment (monofilament, tuning fork, pulses) 0 []  - Peripheral Arterial Disease Assessment (using hand held doppler) 0 ASSESSMENTS - Ostomy and/or Continence Assessment and Care []  - Incontinence Assessment and Management 0 []  - Ostomy Care Assessment and Management (repouching, etc.) 0 PROCESS - Coordination of Care X - Simple Patient / Family Education for ongoing care 1 15 []  - Complex (extensive) Patient / Family Education for ongoing care 0 X - Staff obtains Programmer, systems, Records, Test Results / Process Orders 1 10 []  - Staff telephones HHA, Nursing Homes / Clarify orders / etc 0 []  - Routine Transfer to another Facility (non-emergent condition) 0 []  - Routine Hospital Admission (non-emergent condition) 0 []  - New Admissions / Biomedical engineer / Ordering NPWT, Apligraf, etc. 0 []  - Emergency Hospital Admission (emergent condition) 0 X - Simple Discharge Coordination 1 10 []  - Complex (extensive) Discharge Coordination 0 PROCESS - Special Needs []  - Pediatric / Minor Patient Management 0 []  - Isolation Patient Management 0 []  - Hearing / Language / Visual special needs 0 []  - Assessment of Community assistance (transportation, D/C planning, etc.) 0 []  - Additional assistance / Altered mentation 0 []  - Support Surface(s) Assessment (bed, cushion, seat, etc.)  0 INTERVENTIONS - Wound Cleansing / Measurement X - Simple Wound Cleansing - one wound 1 5 []  - Complex Wound Cleansing - multiple wounds 0 X - Wound Imaging (photographs -  any number of wounds) 1 5 []  - Wound Tracing (instead of photographs) 0 X - Simple Wound Measurement - one wound 1 5 []  - Complex Wound Measurement - multiple wounds 0 INTERVENTIONS - Wound Dressings X - Small Wound Dressing one or multiple wounds 1 10 []  - Medium Wound Dressing one or multiple wounds 0 []  - Large Wound Dressing one or multiple wounds 0 X - Application of Medications - topical 1 5 []  - Application of Medications - injection 0 INTERVENTIONS - Miscellaneous []  - External ear exam 0 []  - Specimen Collection (cultures, biopsies, blood, body fluids, etc.) 0 []  - Specimen(s) / Culture(s) sent or taken to Lab for analysis 0 []  - Patient Transfer (multiple staff / Civil Service fast streamer / Similar devices) 0 []  - Simple Staple / Suture removal (25 or less) 0 []  - Complex Staple / Suture removal (26 or more) 0 []  - Hypo / Hyperglycemic Management (close monitor of Blood Glucose) 0 []  - Ankle / Brachial Index (ABI) - do not check if billed separately 0 X - Vital Signs 1 5 Has the patient been seen at the hospital within the last three years: Yes Total Score: 90 Level Of Care: New/Established - Level 3 Electronic Signature(s) Signed: 09/29/2019 5:39:56 PM By: Carlene Coria RN Entered By: Carlene Coria on 09/29/2019 10:55:42 -------------------------------------------------------------------------------- Encounter Discharge Information Details Patient Name: Date of Service: Hannah [name]. 09/29/2019 9:30 AM Medical Record [id number] Patient Account Number: [id number] Date of Birth/Sex: Treating RN: 02-15-1946 (74 y.o. Clearnce Sorrel Primary Care Treyce Spillers: [name] Other Clinician: Referring Natale Thoma: Treating Jamol Ginyard/Extender:Robson, Quincy Carnes, Marcelle Smiling in Treatment: 8 Encounter  Discharge Information Items Discharge Condition: Stable Ambulatory Status: Ambulatory Discharge Destination: Home Transportation: Private Auto Accompanied By: husband Schedule Follow-up Appointment: Yes Clinical Summary of Care: Patient Declined Notes applied skin prep and adhesvie foam to site, area is healed but newly epithelialized. foam used for Multimedia programmer) Signed: 09/29/2019 5:57:12 PM By: Kela Millin Entered By: Kela Millin on 09/29/2019 11:06:36 -------------------------------------------------------------------------------- Lower Extremity Assessment Details Patient Name: Date of Service: LATANZA, PFEFFERKORN 09/29/2019 9:30 AM Medical Record [id number] Patient Account Number: [id number] Date of Birth/Sex: Treating RN: 06/09/1946 (74 y.o. Orvan Falconer Primary Care Danisa Kopec: [name] Other Clinician: Referring Karsen Fellows: Treating Yulieth Carrender/Extender:Robson, Quincy Carnes, [place]UST[place] Weeks in Treatment: 8 Electronic Signature(s) Signed: 09/29/2019 1:29:28 PM By: Sandre Kitty Signed: 09/29/2019 5:39:56 PM By: Carlene Coria RN Entered By: Sandre Kitty on 09/29/2019 09:50:34 -------------------------------------------------------------------------------- Multi Wound Chart Details Patient Name: Date of Service: Hannah [name]. 09/29/2019 9:30 AM Medical Record [id number] Patient Account Number: [id number] Date of Birth/Sex: Treating RN: 19-Nov-1945 (74 y.o. Orvan Falconer Primary Care Huntley Knoop: [name] Other Clinician: Referring Sumie Remsen: Treating Maili Shutters/Extender:Robson, Quincy Carnes, [place] Weeks in Treatment: 8 Vital Signs Height(in): 64 Capillary Blood 98 Glucose(mg/dl): Weight(lbs): 145 Pulse(bpm): 40 Body Mass Index(BMI): 25 Blood Pressure(mmHg): 144/50 Temperature(F): 98.3 Respiratory 18 Rate(breaths/min): Photos: [1:No Photos] [N/A:N/A] Wound Location: [1:Right Gluteus] [N/A:N/A] Wounding Event:  [1:Pressure Injury] [N/A:N/A] Primary Etiology: [1:Pressure Ulcer] [N/A:N/A] Comorbid History: [1:Cataracts, Anemia, Asthma, N/A Arrhythmia, Congestive Heart Failure, Coronary Artery Disease, Hypertension, Type II Diabetes, Osteoarthritis, Neuropathy, Received Chemotherapy, Received Radiation, Confinement Anxiety] Date Acquired: [1:07/04/2019] [N/A:N/A] Weeks of Treatment: [1:8] [N/A:N/A] Wound Status: [1:Healed - Epithelialized] [N/A:N/A] Measurements L x W x D 0x0x0 [N/A:N/A] (cm) Area (cm) : [1:0] [N/A:N/A] Volume (cm) : [1:0] [N/A:N/A] % Reduction in Area: [1:100.00%] [N/A:N/A] % Reduction in Volume: 100.00% [N/A:N/A] Classification: [1:Category/Stage III] [N/A:N/A] Exudate Amount: [1:None Present] [N/A:N/A] Granulation Amount: [1:None Present (  0%)] [N/A:N/A] Necrotic Amount: [1:None Present (0%)] [N/A:N/A] Exposed Structures: [1:Fascia: No Fat Layer (Subcutaneous Tissue) Exposed: No Tendon: No Muscle: No Joint: No Bone: No Large (67-100%)] [N/A:N/A N/A] Treatment Notes Electronic Signature(s) Signed: 09/29/2019 5:39:56 PM By: Carlene Coria RN Signed: 09/29/2019 5:47:38 PM By: Linton Ham MD Entered By: Linton Ham on 09/29/2019 11:22:31 -------------------------------------------------------------------------------- [place] Details Patient Name: Date of Service: Hannah [name]. 09/29/2019 9:30 AM Medical Record [id number] Patient Account Number: [id number] Date of Birth/Sex: Treating RN: August 10, 1945 (74 y.o. Orvan Falconer Primary Care Gilma Bessette: [name] Other Clinician: Referring Georgianna Band: Treating Desarie Feild/Extender:Robson, Quincy Carnes, [place] Weeks in Treatment: 8 Active Inactive Wound/Skin Impairment Nursing Diagnoses: Knowledge deficit related to ulceration/compromised skin integrity Goals: Ulcer/skin breakdown will have a volume reduction of 30% by week 4 Date Initiated: 08/04/2019 Date Inactivated: 09/15/2019 Target  Resolution Date: 09/04/2019 Goal Status: Unmet Unmet Reason: comorbities Ulcer/skin breakdown will have a volume reduction of 50% by week 8 Date Initiated: 08/04/2019 Date Inactivated: 09/15/2019 Target Resolution Date: 09/04/2019 Goal Status: Unmet Unmet Reason: comorbites Ulcer/skin breakdown will have a volume reduction of 80% by week 12 Date Initiated: 09/15/2019 Target Resolution Date: 10/09/2019 Goal Status: Active Interventions: Assess patient/caregiver ability to obtain necessary supplies Assess patient/caregiver ability to perform ulcer/skin care regimen upon admission and as needed Assess ulceration(s) every visit Notes: Electronic Signature(s) Signed: 09/29/2019 5:39:56 PM By: Carlene Coria RN Entered By: Carlene Coria on 09/29/2019 10:43:23 -------------------------------------------------------------------------------- Pain Assessment Details Patient Name: Date of Service: VANNAH, NADAL 09/29/2019 9:30 AM Medical Record [phone] Patient Account Number: [id number] Date of Birth/Sex: Treating RN: 04/30/1946 (74 y.o. Orvan Falconer Primary Care Bonita Brindisi: [name] Other Clinician: Referring Zakiyah Diop: Treating Talyn Dessert/Extender:Robson, Quincy Carnes, [place]UST[place] Weeks in Treatment: 8 Active Problems Location of Pain Severity and Description of Pain Patient Has Paino No Site Locations Pain Management and Medication Current Pain Management: Electronic Signature(s) Signed: 09/29/2019 1:29:28 PM By: Sandre Kitty Signed: 09/29/2019 5:39:56 PM By: Carlene Coria RN Entered By: Sandre Kitty on 09/29/2019 09:50:41 -------------------------------------------------------------------------------- Patient/Caregiver Education Details Patient Name: Date of Service: Hannah [name] 4/20/2021andnbsp9:30 AM Medical Record [id number] Patient Account Number: [id number] Date of Birth/Gender: Treating RN: 20-Jun-1945 (74 y.o. Orvan Falconer Primary Care Physician:  [name] Other Clinician: Referring Physician: Treating Physician/Extender:Robson, Quincy Carnes, Marcelle Smiling in Treatment: 8 Education Assessment Education Provided To: Patient Education Topics Provided Wound/Skin Impairment: Methods: Explain/Verbal Responses: State content correctly Electronic Signature(s) Signed: 09/29/2019 5:39:56 PM By: Carlene Coria RN Entered By: Carlene Coria on 09/29/2019 10:43:43 -------------------------------------------------------------------------------- Wound Assessment Details Patient Name: Date of Service: KAVINA, CANTAVE 09/29/2019 9:30 AM Medical Record [id number] Patient Account Number: [id number] Date of Birth/Sex: Treating RN: 1945-11-27 (74 y.o. Orvan Falconer Primary Care Arthor Gorter: [name] Other Clinician: Referring Abb Gobert: Treating Mcihael Hinderman/Extender:Robson, Quincy Carnes, [place] Weeks in Treatment: 8 Wound Status Wound Number: 1 Primary Pressure Ulcer Etiology: Wound Location: Right Gluteus Wound Healed - Epithelialized Wounding Event: Pressure Injury Status: Date Acquired: 07/04/2019 Comorbid Cataracts, Anemia, Asthma, Arrhythmia, Weeks Of Treatment: 8 History: Congestive Heart Failure, Coronary Artery Clustered Wound: No Disease, Hypertension, Type II Diabetes, Osteoarthritis, Neuropathy, Received Chemotherapy, Received Radiation, Confinement Anxiety Wound Measurements Length: (cm) 0 % Reduction Width: (cm) 0 % Reduction Depth: (cm) 0 Epitheliali Area: (cm) 0 Tunneling: Volume: (cm) 0 Underminin Wound Description Classification: Category/Stage III Foul Odor Exudate Amount: None Present Slough/Fib After Cleansing: No rino No in Area: 100% in Volume: 100% zation: Large (67-100%) No g: No Wound Bed Granulation Amount: None Present (0%) Exposed Structure Necrotic Amount: None  Present (0%) Fascia Exposed: No Fat Layer (Subcutaneous Tissue) Exposed: No Tendon Exposed: No Muscle Exposed:  No Joint Exposed: No Bone Exposed: No Electronic Signature(s) Signed: 09/29/2019 5:39:56 PM By: Carlene Coria RN Signed: 09/29/2019 6:13:22 PM By: Baruch Gouty RN, BSN Entered By: Baruch Gouty on 09/29/2019 10:06:20 -------------------------------------------------------------------------------- [place] Details Patient Name: Date of Service: Hannah [name]. 09/29/2019 9:30 AM Medical Record [id number] Patient Account Number: [id number] Date of Birth/Sex: Treating RN: [birth year] (74 y.o. Orvan Falconer Primary Care Artia Singley: [name] Other Clinician: Referring Jawan Chavarria: Treating Hendrix Console/Extender:Robson, Quincy Carnes, [place] Weeks in Treatment: 8 Vital Signs Time Taken: 09:50 Temperature (F): 98.3 Height (in): 64 Pulse (bpm): 53 Weight (lbs): 145 Respiratory Rate (breaths/min): 18 Body Mass Index (BMI): 24.9 Blood Pressure (mmHg): 144/50 Capillary Blood Glucose (mg/dl): 98 Reference Range: 80 - 120 mg / dl Electronic Signature(s) Signed: 09/29/2019 1:29:28 PM By: Sandre Kitty Entered By: Sandre Kitty on 09/29/2019 09:52:27

## 2019-09-30 NOTE — Progress Notes (Signed)
Patient Care Team: [name], DO as PCP - General [name], NP as PCP - Cardiology (Nurse Practitioner) Evans Lance, MD as PCP - Electrophysiology (Cardiology) Alphonsa Overall, MD as Consulting Physician (General Surgery) Nicholas Lose, MD as Consulting Physician (Hematology [facility]) Gery Pray, MD as Consulting Physician (Hannah [name] Oncology) Evans Lance, MD as Consulting Physician (Cardiology) Collene Gobble, MD as Consulting Physician (Pulmonary Disease) Elayne Snare, MD as Consulting Physician (Endocrinology) Delice Bison, Charlestine Massed, NP as Nurse Practitioner (Hematology Hannah [facility]) Ardelle Balls., MD (Neurology) Izora Gala, MD as Consulting Physician (Otolaryngology) Trula Slade, DPM as Consulting Physician (Podiatry)  DIAGNOSIS:    ICD-10-CM   1. Malignant neoplasm of upper-inner quadrant of right breast in female, estrogen receptor positive ([place])  C50.211    Z17.0     SUMMARY OF ONCOLOGIC HISTORY: Oncology History  Breast cancer of upper-inner quadrant of right female breast ([place])  12/14/2015 Initial Diagnosis   Right breast biopsy 12:30 position: 2 masses, 1.6 cm mass: Invasive ductal carcinoma, grade 2, ER 0%, PR 0%, HER-2 negative, Ki-67 70%; satellite mass 8 mm: IDC grade 2 ER 5%, PR 5%, HER-2 negative, Ki-67 50%; T1cN0 stage IA clinical stage   01/18/2016 Surgery   Right [name]: Multifocal IDC grade 3, 1.9 cm  ER 0%, PR 0%, HER-2 negative, Ki-67 70% Hannah 0.8 cm satellite mass ER 5%, PR 2%, HER-2 negative, Ki-67 50%, high-grade DCIS, margins negative, 0/1 lymph nodes negative T1 cN0 stage IA   02/17/2016 - 04/06/2016 [name]   [place] Hannah Cytoxan 3 stopped due to neuropathy Hannah recurrent cellulitis of legs    04/08/2016 - 04/23/2016 Hospital Admission   Hosp adm for cellulitis   05/24/2016 - 07/25/2016 Hannah [name] Therapy    Adj XRT   06/29/2016 - 07/04/2016 Hospital Admission   Seizure like activity, MRI brain Hannah EEG  unremarkable    08/16/2016 -  Anti-estrogen oral therapy   Letrozole could not tolerate it due to dizziness Hannah lightheadedness, switched to anastrozole 04/04/2017 switched to exemestane 05/22/2017     CHIEF COMPLIANT: Follow-upofsevereanemia  INTERVAL HISTORY: Hannah [name] is a 74 y.o. with above-mentioned history of right breast cancer treated with [name],Hannah [name],Hannah [name], Hannah [name].Shepresents to the clinic today forfollow-up.   ALLERGIES:  is allergic to amoxicillin; tape; aldactone [spironolactone]; imdur [isosorbide dinitrate]; arimidex [anastrozole]; latex; Hannah tetracycline.  MEDICATIONS:  Current Outpatient Medications  Medication Sig Dispense Refill  . acetaminophen (TYLENOL) 325 MG tablet Take 2 tablets (650 mg total) by mouth every 6 (six) hours as needed for mild pain (or Fever >/= 101).    Marland Kitchen albuterol (PROVENTIL HFA;VENTOLIN HFA) 108 (90 Base) MCG/ACT inhaler Inhale 2 puffs into the lungs every 6 (six) hours as needed for wheezing or shortness of breath.    Marland Kitchen amiodarone (PACERONE) 200 MG tablet Take 1 tablet (200 mg total) by mouth daily. 90 tablet 3  . Blood Glucose Monitoring Suppl (ACCU-CHEK GUIDE ME) w/Device KIT Use accu chek meter to check blood sugar three times daily. 1 kit 0  . budesonide-formoterol (SYMBICORT) 160-4.5 MCG/ACT inhaler Inhale 2 puffs into the lungs 2 (two) times daily.    Marland Kitchen ELIQUIS 5 MG TABS tablet Take 1 tablet (5 mg total) by mouth 2 (two) times daily. 180 tablet 1  . epoetin alfa-epbx (RETACRIT) 66063 UNIT/ML injection Inject 40,000 Units into the skin every Thursday.     Marland Kitchen exemestane (AROMASIN) 25 MG  tablet TAKE ONE TABLET BY MOUTH DAILY AFTER BREAKFAST 90 tablet 3  . ezetimibe (ZETIA) 10 MG tablet Take 10 mg by mouth daily.    . famotidine (PEPCID) 20 MG tablet Take 20 mg by  mouth 2 (two) times daily.    . furosemide (LASIX) 20 MG tablet Take 20 mg by mouth daily as needed.    Marland Kitchen glucose blood (ACCU-CHEK GUIDE) test strip Use to test blood sugar 3 times daily 150 each 3  . HYDROcodone-acetaminophen (NORCO/VICODIN) 5-325 MG tablet Take 1 tablet by mouth every 8 (eight) hours as needed for severe pain.    Marland Kitchen insulin glargine (LANTUS) 100 UNIT/ML injection Inject 18 Units into the skin daily.    . Insulin Lispro (HUMALOG KWIKPEN [place]) Inject 4-6 Units into the skin 3 (three) times daily.    . Insulin Pen Needle (BD PEN NEEDLE NANO U/F) 32G X 4 MM MISC USE AS INSTRUCTED TO INJECT INSULIN 4 TIMES DAILY. 360 each 1  . loratadine (CLARITIN) 10 MG tablet Take 10 mg by mouth daily.     . metoprolol tartrate (LOPRESSOR) 25 MG tablet Take 1 tablet (25 mg total) by mouth 2 (two) times daily. 180 tablet 3  . midodrine (PROAMATINE) 2.5 MG tablet Take 2.5 mg by mouth 3 (three) times daily with meals.    . nitroGLYCERIN (NITROSTAT) 0.4 MG SL tablet Place 1 tablet (0.4 mg total) under the tongue every 5 (five) minutes as needed for chest pain. 30 tablet 3  . rosuvastatin (CRESTOR) 40 MG tablet Take 1 tablet (40 mg total) by mouth at bedtime. 90 tablet 3  . zolpidem (AMBIEN) 5 MG tablet Take 5 mg by mouth at bedtime.      No current facility-administered medications for this visit.    PHYSICAL EXAMINATION: ECOG PERFORMANCE STATUS: 1 - Symptomatic but completely ambulatory  Vitals:   10/01/19 1435  BP: (!) 128/45  Pulse: (!) 59  Resp: 16  Temp: 98.7 F (37.1 C)  SpO2: 100%   Filed Weights   10/01/19 1435  Weight: 147 lb 8 oz (66.9 kg)    LABORATORY DATA:  I have reviewed the data as listed CMP Latest Ref Rng & Units 09/22/2019 09/15/2019 09/09/2019  Glucose 65 - 99 mg/dL 206(H) 150(H) 108(H)  BUN 8 - 27 mg/dL 36(H) 36(H) 34(H)  Creatinine 0.57 - 1.00 mg/dL 2.07(H) 2.30(H) 2.75(H)  Sodium 134 - 144 mmol/L 142 138 139  Potassium 3.5 - 5.2 mmol/L 5.1 5.6(H) 5.6(H)  Chloride  96 - 106 mmol/L 110(H) 108(H) 108(H)  CO2 20 - 29 mmol/L 21 19(L) 22  Calcium 8.7 - 10.3 mg/dL 8.9 8.6(L) 8.8  Total Protein 6.0 - 8.5 g/dL - - -  Total Bilirubin 0.0 - 1.2 mg/dL - - -  Alkaline Phos 39 - 117 IU/L - - -  AST 0 - 40 IU/L - - -  ALT 0 - 32 IU/L - - -    Lab Results  Component Value Date   WBC 5.7 10/01/2019   HGB 8.8 (L) 10/01/2019   HCT 31.1 (L) 10/01/2019   MCV 96.0 10/01/2019   PLT 125 (L) 10/01/2019   NEUTROABS 4.1 10/01/2019    ASSESSMENT & PLAN:  Breast cancer of upper-inner quadrant of right female breast ([place]) Patient has had longstanding anemia where the hemoglobin was between 10 to 12 g. Since April 2020 her hemoglobin has gotten worse During the same timeframe her creatinine got from 1.5-2.44  Current treatment:Retacritinjectionsweeklystarted7/31/2020 Bone marrow biopsy 04/08/2019: Normocellular bone  marrow with trilineage hematopoiesis, absent [name] stores, flow cytometry no abnormal B or T-cell clonal abnormalities noted.   IV [name] therapy:Venofer weekly x4 04/24/19-05/15/19 Patient also has congestive heart failure Hannah pleural effusions required Pleurx catheter placement. Lab review: Hemoglobin   Kidney stones: she lost significant amount of blood.  We will continue her weekly injections. Return to clinic weekly for Retacrit injections Hannah in 1 month for follow-up with me.    No orders of the defined types were placed in this encounter.  The patient has a good understanding of the overall plan. she agrees with it. she will call with any problems that may develop before the next visit here.  Total time spent: 20 mins including face to face time Hannah time spent for planning, charting Hannah coordination of care  Nicholas Lose, MD 10/01/2019  I, Cloyde Reams Dorshimer, am acting as scribe for Dr. Nicholas Lose.  I have reviewed the above documentation for accuracy Hannah completeness, Hannah I agree with the above.

## 2019-09-30 NOTE — Progress Notes (Signed)
CARDIOLOGY OFFICE NOTE  Date:  10/14/2019    Hannah [name] Date of Birth: February 01, 1946 Medical Record #[id number]  PCP:  [name], DO  Cardiologist:  Atilano Median  Chief Complaint  Patient presents with  . Follow-up    History of Present Illness: Hannah [name] is a 74 y.o. female who presents today for a follow up visit. Seen for Dr. Lovena Le.Primarily follows with me.  She has a very complex/extensivehistory - this includes ahistory ofCAD s/p CABG (1998 with redo 2009- Dr. Cyndia Bent), bioprosthetic AVR in 4917, chronic diastolic CHF, asthma, carotid artery disease (1-39% by duplex 10/2017, due 2021), CKD stage IV(Dr. Posey Pronto), PAF- has been on Multaq in the past and now on amiodarone, atrial tachycardia, breast CA, orthostasis,chronicanemia, thrombocytopenia, mild dilation of ascending aorta by echo 10/2018, recurrent syncope/orthostasis,recurrentrightpleural effusion requiring thoracentesis4/2020and 05/2019, HTN, GERD,&HLD.   I have followed her for many years - she has had periodic issues with volume overload, orthostasis and tachy palpitations.Last cath in 2014 - grafts patent. Myoview in 2015 without ischemia.  Over the past few months she has had more issues with her pleural effusion - diuretics caused worsening CKD/orthostasis- resulting in injury. She has gotten more anemic(down to 6)- back to hematology and now on therapy. Concern for MDS andhas hadbone marrow biopsy - wasalso needing colonoscopy due to profound anemia (HGB down to 6 which is highly unusual for Hannah [name]).Most recent abdominal ultrasound noted and concerning for liver abnormality. Recent mammogram also recently noted and abnormal - ended up havingmultiplerepeat biopsies that were negative.  Most recent course has beenprettyeventful - she has hadto be placed back onlow dose diuretics forarecurring effusion.Pulmonary wished for me to dose her diuretics due to her fragile  nature with orthostasis/CKD.Progressive failure to thrive. Has been back to the hospital in the interim- treated for CHF and pneumonia. Remainsmarkedlyanemic - needed colonoscopy. Has had abnormal mammogram with 4subsequent negativebiopsies. At one visit in September - she was coming in here and got dizzy/fell in the parking loton the curband had significant laceration to the back of her her head. ILR interrogation was ok at that time. Have added back Midodrine for orthostasisto prevent injury.She has been slow to recoverand make progress. Has seen nephrology who thought her kidney function was stable. Ended up having CT in place of colonoscopy per GI.  She has had recurrent effusion - referred to Dr. Cyndia Bent and she had Pleurex cath placement with talcand subsequent removal with improvement. Cytologies on prior taps negative. Wegot her echo updated as well - EF remains normal.I have followed her closely - she had a kidney stone that ended up causing worsening CKD - had lithotripsy - had a foley for a short time and had worsening anemia and required transfusion again.   Last seen at the end of march - felt to be holding her own. She did see EP last week for her ILR interrogation which was ok.   The patient does not have symptoms concerning for COVID-19 infection (fever, chills, cough, or new shortness of breath).   Comes in today. Here with her husband [name]. She does not feel as well. She notes more swelling in her legs. Belly feels full - early satiety. She notes this fullness thru out her chest - mostly after eating - she wonders if it is angina - she does not use sl NTG. This can last for several hours at a time. She does not use NTG. BP around 114 to 120 at home. No  syncope. Weight is around 145 to 147 at home - she can't get it below 145 despite taking extra lasix. She is using her support stockings. She remains anemic - her count remains around 8.5. Going tomorrow for her shot thru  the cancer center. She has had a good report from Renal - potassium and kidney function continue to improve per her report.   Past Medical History:  Diagnosis Date  . Abnormally small mouth   . Allergic rhinitis 10/14/2009   Qualifier: Diagnosis of  By: Lamonte Sakai MD, Rose Fillers   Overview:  Overview:  Qualifier: Diagnosis of  By: Lamonte Sakai MD, Rose Fillers  Last Assessment & Plan:  Please continue Xyzal and Nasacort as you have been using them  . Anemia   . Asthma 05/12/2009   10/12/2014 p extensive coaching HFA effectiveness =    75% s spacer    Overview:  Overview:  10/12/2014 p extensive coaching HFA effectiveness =    75% s spacer   Last Assessment & Plan:  Please continue Symbicort 2 puffs twice a day. Remember to rinse and gargle after taking this medication. Take albuterol 2 puffs up to every 4 hours if needed for shortness of breath.  Follow with Dr Lamonte Sakai in 6 month  . Bilateral carotid artery stenosis    a. mild 1-39% by duplex 10/2017, due 2021.  . Breast cancer ([place]) 01/18/2016   right breast  . CAD (coronary artery disease) 01/24/2011   a. s/p CABG 1998 with redo 2009.  Marland Kitchen Chemotherapy induced nausea and vomiting 02/24/2016  . Chemotherapy-induced peripheral neuropathy ([place]) 04/27/2016  . 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 Chronic diastolic CHF (congestive heart failure) ([place]) 09/24/2013  . CKD (chronic kidney disease), stage IV ([place]) 09/24/2013  . Closed fracture of head of left humerus 09/09/2017  . Complication of anesthesia   . Dysrhythmia    a-fib  . Gallstones   . GERD (gastroesophageal reflux disease)   . Glaucoma   . H/O atrial tachycardia 05/12/2009   Qualifier: History of  By: Lamonte Sakai MD, Rose Fillers   Overview:  Overview:  Qualifier: History of  By: Lamonte Sakai MD, Rose Fillers  Last Assessment & Plan:  There was no evidence of this on her cardiac monitor. I would recommend watchful waiting.   Marland Kitchen Heart murmur   . History of kidney stones   . History of non-ST elevation myocardial  infarction (NSTEMI)    Sept 2014--  thought to be type II HTN w/ LHC without infarct related artery and patent grafts  . History of radiation therapy 05/24/16-07/26/16   right breast 50.4 Gy in 28 fractions, right breast boost 10 Gy in 5 fractions  . Hyperlipidemia   . Hypotension   . Iron deficiency anemia   . Mania ([place]) 03/05/2016  . Moderate persistent asthma    pulmologist-  Dr. Malvin Johns  . Osteomyelitis of toe of left foot ([place]) 04/06/2016  . Osteopenia of multiple sites 10/19/2015  . PAF (paroxysmal atrial fibrillation) ([place])   . Personal history of chemotherapy 2017  . Personal history of radiation therapy 2017  . PONV (postoperative nausea and vomiting)   . Port catheter in place 02/17/2016  . Psoriasis    right leg  . Renal calculus, right   . S/P AVR    prosthesis valve placement 2009 at same time re-do CABG  . Seizure-like activity ([place]) 09/01/2017  . Sensorineural hearing loss (SNHL) of both ears 01/06/2016  . Stroke [facility]) 2014   residual rt hearing  loss  . Syncope 09/06/2017  . Type 2 diabetes mellitus ([place])    monitored by dr Dwyane Dee    Past Surgical History:  Procedure Laterality Date  . AORTIC VALVE REPLACEMENT  2009   [facility]done same time is CABG)  . BREAST LUMPECTOMY Right 01/18/2016  . BREAST LUMPECTOMY WITH RADIOACTIVE SEED AND SENTINEL LYMPH NODE BIOPSY Right 01/18/2016   Procedure: RIGHT BREAST LUMPECTOMY WITH RADIOACTIVE SEED AND SENTINEL LYMPH NODE BIOPSY;  Surgeon: DAlphonsa Overall MD;  [place]  Service: General;  Laterality: Right;  . CARDIAC CATHETERIZATION  03/23/2008   Pre-redo CABG: L main OK, LAD (T), CFX (T), OM1 99%, RCA (T), LIMA-LAD OK, SVG-OM(?3) OK w/ little florw to OM2, SVG-RCA OK. EF NL  . CARPAL TUNNEL RELEASE    . CHEST TUBE INSERTION Right 06/26/2019   Procedure: INSERTION PLEURAL DRAINAGE CATHETER;  Surgeon: BGaye Pollack MD;  [place]  Service: Thoracic;  Laterality: Right;  .  CHOLECYSTECTOMY N/A 07/07/2018   Procedure: LAPAROSCOPIC CHOLECYSTECTOMY WITH INTRAOPERATIVE CHOLANGIOGRAM ERAS PATHWAY;  Surgeon: NAlphonsa Overall MD;  [facility];  Service: General;  Laterality: N/A;  . COLONOSCOPY     around 2015. Possibly with [facility]   . CORONARY ARTERY BYPASS GRAFT  1998 &  re-do 2009   Had LIMA to DX/LAD, SVG to 2 marginal branches and [facility]; SVG to 3rd OM and PD at time of redo  . CYSTOSCOPY W/ URETERAL STENT PLACEMENT Right 12/20/2014   Procedure: CYSTOSCOPY WITH RETROGRADE PYELOGRAM/URETERAL STENT PLACEMENT;  Surgeon: PCleon Gustin MD;  [facility]  Service: Urology;  Laterality: Right;  . CYSTOSCOPY WITH RETROGRADE PYELOGRAM, URETEROSCOPY AND STENT PLACEMENT Left 08/21/2019   Procedure: CYSTOSCOPY WITH RETROGRADE PYELOGRAM, URETEROSCOPY AND STENT PLACEMENT;  Surgeon: MCleon Gustin MD;  [facility];  Service: Urology;  Laterality: Left;  1 HR  . ESOPHAGOGASTRODUODENOSCOPY     many years ago per patient   . ESOPHAGOGASTRODUODENOSCOPY ENDOSCOPY  06/17/2018  . EYE SURGERY Bilateral    cataracts  . HOLMIUM LASER APPLICATION Right 73/22/0254  Procedure:  HOLMIUM LASER LITHOTRIPSY;  Surgeon: PCleon Gustin MD;  [facility]  Service: Urology;  Laterality: Right;  . HOLMIUM LASER APPLICATION Left 32/70/6237  Procedure: HOLMIUM LASER APPLICATION;  Surgeon: MCleon Gustin MD;  [facility];  Service: Urology;  Laterality: Left;  . IR THORACENTESIS ASP PLEURAL SPACE W/IMG GUIDE  10/03/2018  . IR THORACENTESIS ASP PLEURAL SPACE W/IMG GUIDE  06/08/2019  . LEFT HEART CATHETERIZATION WITH CORONARY/GRAFT ANGIOGRAM N/A 02/23/2013   Procedure: LEFT HEART CATHETERIZATION WITH CBeatrix Fetters  Surgeon: MBlane Ohara MD;  [facility];  Service: Cardiovascular;  Laterality: N/A;  . LOOP RECORDER INSERTION N/A 08/30/2017   Procedure: LOOP RECORDER INSERTION;  Surgeon:  TEvans Lance MD;  [facility];  Service: Cardiovascular;  Laterality: N/A;  . PORTACATH PLACEMENT Left 01/18/2016   Procedure: INSERTION PORT-A-CATH;  Surgeon: DAlphonsa Overall MD;  [place]  Service: General;  Laterality: Left;  . portacath removal    . REMOVAL OF PLEURAL DRAINAGE CATHETER Right 07/14/2019   Procedure: REMOVAL OF PLEURAL DRAINAGE CATHETER;  Surgeon: BGaye Pollack MD;  [place]  Service: Thoracic;  Laterality: Right;  . TALC PLEURODESIS N/A 06/26/2019   Procedure: TPietro Cassis  Surgeon: BGaye Pollack MD;  [place]  Service: Thoracic;  Laterality: N/A;  .  TONSILLECTOMY    . TRANSTHORACIC ECHOCARDIOGRAM  02-24-2013      mild LVH,  ef 50-55%/  AV bioprosthesis was present with very mild stenosis and no regurg., mean grandient 83mHg, peak grandient 279mg /  mild MR/  mild LAE and RAE/  moderate TR  . TUBAL LIGATION       Medications: Current Meds  Medication Sig  . ACCU-CHEK GUIDE test strip Use to test blood sugar 3 times daily  . acetaminophen (TYLENOL) 325 MG tablet Take 2 tablets (650 mg total) by mouth every 6 (six) hours as needed for mild pain (or Fever >/= 101).  . Marland Kitchenlbuterol (VENTOLIN HFA) 108 (90 Base) MCG/ACT inhaler Inhale 2 puffs into the lungs every 6 (six) hours as needed for wheezing or shortness of breath.  . Marland Kitchenmiodarone (PACERONE) 200 MG tablet Take 1 tablet (200 mg total) by mouth daily.  . Blood Glucose Monitoring Suppl (ACCU-CHEK GUIDE ME) w/Device KIT Use accu chek meter to check blood sugar three times daily.  . budesonide-formoterol (SYMBICORT) 160-4.5 MCG/ACT inhaler Inhale 2 puffs into the lungs 2 (two) times daily.  . Marland KitchenLIQUIS 5 MG TABS tablet Take 1 tablet (5 mg total) by mouth 2 (two) times daily.  . Marland Kitchenpoetin alfa-epbx (RETACRIT) 4001749NIT/ML injection Inject 40,000 Units into the skin every Thursday.   . Marland Kitchenxemestane (AROMASIN) 25 MG tablet TAKE ONE TABLET BY MOUTH DAILY AFTER BREAKFAST  . ezetimibe  (ZETIA) 10 MG tablet Take 10 mg by mouth daily.  . famotidine (PEPCID) 20 MG tablet Take 20 mg by mouth 2 (two) times daily.  . furosemide (LASIX) 20 MG tablet Take 20 mg by mouth daily as needed.  . Marland KitchenYDROcodone-acetaminophen (NORCO/VICODIN) 5-325 MG tablet Take 1 tablet by mouth every 8 (eight) hours as needed for severe pain.  . Marland Kitchennsulin glargine (LANTUS) 100 UNIT/ML injection Inject 18 Units into the skin daily.  . Insulin Lispro (HUMALOG KWIKPEN [place]) Inject 4-6 Units into the skin 3 (three) times daily.  . Insulin Pen Needle (BD PEN NEEDLE NANO U/F) 32G X 4 MM MISC USE AS INSTRUCTED TO INJECT INSULIN 4 TIMES DAILY.  . Marland Kitchenoratadine (CLARITIN) 10 MG tablet Take 10 mg by mouth daily.   . metoprolol tartrate (LOPRESSOR) 25 MG tablet Take 1 tablet (25 mg total) by mouth 2 (two) times daily.  . midodrine (PROAMATINE) 2.5 MG tablet Take 2.5 mg by mouth 3 (three) times daily with meals.  . nitroGLYCERIN (NITROSTAT) 0.4 MG SL tablet Place 1 tablet (0.4 mg total) under the tongue every 5 (five) minutes as needed for chest pain.  . rosuvastatin (CRESTOR) 40 MG tablet Take 1 tablet (40 mg total) by mouth at bedtime.  . Marland Kitchenolpidem (AMBIEN) 5 MG tablet Take 5 mg by mouth at bedtime.      Allergies: Allergies  Allergen Reactions  . Amoxicillin Rash and Other (See Comments)    Tolerates Cephalosporins Has patient had a PCN reaction causing immediate rash, facial/tongue/throat swelling, SOB or lightheadedness with hypotension: Yes Has patient had a PCN reaction causing severe rash involving mucus membranes or skin necrosis: Yes Has patient had a PCN reaction that required hospitalization No Has patient had a PCN reaction occurring within the last 10 years: No If all of the above answers are "NO", then may proceed with Cephalosporin use.   . Tape Other (See Comments)    Must use paper tape  . Aldactone [Spironolactone] Other (See Comments)    CKD/hypokalemia  . Imdur [Isosorbide Dinitrate] Other (See  Comments)  Headache/severe hypotension/Syncope  . Arimidex [Anastrozole] Nausea Only  . Latex Itching and Other (See Comments)    (Dentist office)  . Tetracycline Rash    Social History: The patient  reports that she has never smoked. She has never used smokeless tobacco. She reports that she does not drink alcohol or use drugs.   Family History: The patient's family history includes Diabetes in her maternal grandmother and son; Healthy in her brother; Heart attack in her brother; Heart disease in her brother, father, and maternal grandmother; Heart failure in her father.   Review of Systems: Please see the history of present illness.   All other systems are reviewed and negative.   Physical Exam: VS:  BP (!) 144/42   Pulse 62   Ht _0  (1.651 m)   Wt 147 lb (66.7 kg)   LMP  (LMP Unknown)   SpO2 99%   BMI 24.46 kg/m  .  BMI Body mass index is 24.46 kg/m.  Wt Readings from Last 3 Encounters:  10/14/19 147 lb (66.7 kg)  10/06/19 146 lb 1.9 oz (66.3 kg)  10/01/19 147 lb 8 oz (66.9 kg)    General: Pleasant. Chronically ill but alert and in no acute distress. Color is sallow.    Cardiac: Regular rate and rhythm. Outflow murmur noted. She has 1+ bilateral edema.  Respiratory:  Lungs are clear to auscultation bilaterally with normal work of breathing.  Little decreased on the right.  GI: Soft and nontender.  MS: No deformity or atrophy. Gait and ROM intact.  Skin: Warm and dry. Color is sallow. Neuro:  Strength and sensation are intact and no gross focal deficits noted.  Psych: Alert, appropriate and with normal affect.   LABORATORY DATA:  EKG:  EKG is not ordered today.   Lab Results  Component Value Date   WBC 5.4 10/08/2019   HGB 8.7 (L) 10/08/2019   HCT 30.7 (L) 10/08/2019   PLT 123 (L) 10/08/2019   GLUCOSE 206 (H) 09/22/2019   CHOL 106 12/02/2018   TRIG 97 12/02/2018   HDL 34 (L) 12/02/2018   LDLDIRECT 53.0 10/08/2016   LDLCALC 53 12/02/2018   ALT 24  08/03/2019   AST 36 08/03/2019   NA 142 09/22/2019   K 5.1 09/22/2019   CL 110 (H) 09/22/2019   CREATININE 2.07 (H) 09/22/2019   BUN 36 (H) 09/22/2019   CO2 21 09/22/2019   TSH 3.090 08/03/2019   INR 1.4 (H) 06/24/2019   HGBA1C 6.1 (H) 08/13/2019   MICROALBUR 14.2 (H) 07/02/2019     BNP (last 3 results) Recent Labs    03/04/19 1429  BNP 273.4*    ProBNP (last 3 results) Recent Labs    10/28/18 1041 02/19/19 1126 02/23/19 1221  PROBNP 364.0* 2,996* 2,825*     Other Studies Reviewed Today:  ECHO IMPRESSIONS 05/2019  1. Left ventricular ejection fraction, by visual estimation, is 60 to 65%. The left ventricle has normal function. Left ventricular septal wall thickness was normal. Normal left ventricular posterior wall thickness. There is no left ventricular  hypertrophy.  2. Elevated left ventricular end-diastolic pressure.  3. Left ventricular diastolic parameters are consistent with Grade II diastolic dysfunction (pseudonormalization).  4. Global right ventricle has normal systolic function.The right ventricular size is normal. No increase in right ventricular wall thickness.  5. Left atrial size was severely dilated.  6. Right atrial size was normal.  7. Mild mitral annular calcification.  8. Mild thickening of the anterior and posterior  mitral valve leaflet(s).  9. The mitral valve is normal in structure. Mild mitral valve regurgitation. No evidence of mitral stenosis.  10. The tricuspid valve is normal in structure.  11. Aortic valve regurgitation is not visualized. No evidence of aortic valve sclerosis or stenosis.  12. The pulmonic valve was normal in structure. Pulmonic valve regurgitation is trivial.  13. Normal pulmonary artery systolic pressure.  14. The inferior vena cava is normal in size with greater than 50% respiratory variability, suggesting right atrial pressure of 3 mmHg.   CT CHEST IMPRESSION 04/2019:  Moderate right pleural effusion, increased  from prior CT. Associated  right lower lobe atelectasis.  No evidence of pneumonia.  1.2 x 2.4 cm lesion along the inferior aspect of the right  breast/chest wall. While additional findings noted above correlate  with areas of fat necrosis, this lesion was not clearly identified  when correlating with prior breast tomography report. Breast imaging  consultation is suggested for further evaluation, including  potential ultrasound with biopsy versus breast MR. These results  will be called to the ordering clinician or representative by the  Radiologist Assistant, and communication documented in the PACS or  zVision Dashboard.  Aortic Atherosclerosis (ICD10-I70.0).  Electronically Signed  By: Julian Hy M.D.  On: 05/06/2019 18:11   CT ABDOMEN IMPRESSION 03/2019:  Chest Impression:  1. Mild RIGHT basilar atelectasis and small RIGHT effusion.  2. Increased skin thickening in the lateral RIGHT breast. Recommend  clinical correlation.  3. Stable seroma in the RIGHT breast.   Abdomen / Pelvis Impression:  1. No acute findings in the abdomen pelvis.  2. Nonobstructing calculi in the LEFT renal pelvis.  3. Degenerative endplate change in the lumbar spine similar to  comparison CT 2019.  4. Coronary artery calcification and Aortic Atherosclerosis  (ICD10-I70.0).  Electronically Signed  By: Suzy Bouchard M.D.  On: 03/17/2019 09:59   ABD [place] 02/17/2019  Other findings: There is a somewhat lobulated fluid collection  identified along the inferior aspect of the liver. This measures 2.6  x 3.4 x 4.3 cm. This was not seen on the prior CT examination and  may be related to a focal fluid collection from prior  cholecystectomy. Mild perihepatic ascites is noted.  Note is made of right-sided pleural effusion.  IMPRESSION:  Status post cholecystectomy. There is a lobulated cystic area as  described above along the inferior right hepatic margin. This could  be related to the prior  surgery possibly representing a small  seroma. CT may be helpful for further evaluation.  Small right pleural effusion.  Mild ascites.  Increased echogenicity within the liver likely related to fatty  infiltration or underlying hepatocellular disease.  Electronically Signed  By: Inez Catalina M.D.  On: 02/17/2019 10:18    Breast Ultrasound 01/2019  IMPRESSION:  1. New irregular hypoechoic mass along the posterior margin of the  scar right breast 8 o'clock position. Findings are nonspecific  however concerning for the possibility of localized recurrence.  2. Within the outer aspect of the right axilla there are two  adjacent irregular hypoechoic masses just deep to the skin which are  nonspecific. These may represent focal fat necrosis or potentially  recurrent or metastatic disease.  RECOMMENDATION:  1. Ultrasound-guided core needle biopsy right breast mass 8 o'clock  position just deep to the scar.  2. Ultrasound-guided core needle biopsy of both irregular hypoechoic  masses within the outer aspect of the right axilla.    MYOVIEW FINDINGS FROM 06/2013:  Normal  resting EKG. Slight ST depressions noted in aVL after  administration of lexiscan. Mild shortness of breath and nausea  resolved after the test. EKG is nondiagnostic for ischemia. TID  ratio 1.05. Lung-heart ratio 0.43. Normal ventricular chamber size.  IMPRESSION:  No evidence for ischemia. Normal wall motion. LVEF 72%.  Electronically Signed  By: Guy Sandifer    Cardiac Cath: 02/23/2013  Left mainstem: Normal  Left anterior descending (LAD): Severe proximal and mid calcification with long proximal 95% stenosis. The mid and distal vessel is small but free of high grade disease.  Left circumflex (LCx): AV groove has a mid 90% stenosis before a moderate sized MOM. OM1 and OM2 are occluded at the ostium and fill via the SVG. OM3 occluded at the ostium and fills via SVG. The grafted OMs are small and diffusely disease.    Right coronary artery (RCA): Occluded in the mid vessel. The PDA is moderate sized and occluded at the ostium. There is a 70% stenosis in the PDA after the insertion of the vein graft.  Grafts:  LIMA to LAD: Patent  SVG to RCA: Occluded (from original CABG)  SVG to RCA: Patent (from redo CABG). This is mild diffuse plaque within the vein graft.  SVG to OM3: Patent. This is mild diffuse plaque within the vein graft.  SVG sequential to OM1/OM2: Patent. There is ostial 50% stenosis. There is a patent proximal SVG stent. The native marginals are very small.  Left ventriculography: LV not injected. AVR not crossed.  Final Conclusions: Severe native vessel CAD. Patent grafts as described with nonobstructive disease in the grafts. A stent placed into the sequential SVG is patent with some disease at the ostium that on several views in not occlusive. I did compare the 2009 cath with results today. There is high grade disease in the circumflex AV groove. However, this is not changed from 2009. However, this lesion does appear to lead into a non grafted OM. If she has further symptoms I would consider PCI of the native circumflex.     Assessment/Plan:   1. CAD - prior CABG as well as redo CABG - last in 2009 - last cath in 2014 - need to manage medically. We discussed adding back Imdur - she had hypotension with this in the past but is now on Midodrine. We will start 15 mg a day - she will have family with her while starting this. If she does not tolerate she can stop.   2. Recent kidney stone - caused significant worsening of her kidney function.   3. Prior pleural effusion - s/p talc/pleurex - much improved.   Recent kidney stone - that has caused worsening CKD - rechecking BMET today. She looks better - stone is gone, stent is out and catheter is out. No bleeding noted.  2. Prior effusion - s/p talc/pleurex - hopefully resolved.   3. Orthostatic hypotension - with prior injuries - on  Midodrine. She does a nice job of checking her BP.   4. Chronic diastolic HF - her weight is unchanged - she does better at 142 to 145 clinically - I think her anemia is driving a lot of this. Would hold on increasing the Lasix until we see how she responds with the Imdur.   5. Chronic anemia - counts remain low - I think this is a big trigger for her chest pain and swelling.   6. CKD - has had recent visit with Renal.   7. Carotid disease -  noted 1 to 39% bilateral stenosis from study 10/2017 - will need repeat study 10/2019 - this was not discussed today - will talk about on return.   8. Prior breast cancer - recent abnormal mammogram - s/p multiple biopsies. Abnormality noted on the CT - she has had recent biopsies that were negative. This has been reviewed previously with Dr. Lindi Adie and I defer further imaging to Dr. Lindi Adie. This was not discussed today.  9. PAF - on Eliquis - has ILR in place with recent EP visit. Remains on amiodarone as well - will need surveillance lab on return.   10. COVID-19 Education: The signs and symptoms of COVID-19 were discussed with the patient and how to seek care for testing (follow up with PCP or arrange E-visit).  The importance of social distancing, staying at home, hand hygiene and wearing a mask when out in public were discussed today.  Current medicines are reviewed with the patient today.  The patient does not have concerns regarding medicines other than what has been noted above.  The following changes have been made:  See above.  Labs/ tests ordered today include:   No orders of the defined types were placed in this encounter.    Disposition:   FU with me in 3 weeks - will plan to check lab on return and EKG. Her overall prognosis remains tenuous to me.    Patient is agreeable to this plan and will call if any problems develop in the interim.   SignedTruitt Merle, NP  10/14/2019 2:23 PM  [place] [address] [place], [place]  [address] Phone: [phone] Fax: [phone]

## 2019-10-01 ENCOUNTER — Inpatient Hospital Stay: Payer: Medicare Other

## 2019-10-01 ENCOUNTER — Encounter: Payer: Medicare Other | Admitting: Physician Assistant

## 2019-10-01 ENCOUNTER — Inpatient Hospital Stay (HOSPITAL_BASED_OUTPATIENT_CLINIC_OR_DEPARTMENT_OTHER): Payer: Medicare Other | Admitting: Hematology and Oncology

## 2019-10-01 ENCOUNTER — Other Ambulatory Visit: Payer: Self-pay

## 2019-10-01 DIAGNOSIS — I129 Hypertensive chronic kidney disease with stage 1 through stage 4 chronic kidney disease, or unspecified chronic kidney disease: Secondary | ICD-10-CM | POA: Diagnosis not present

## 2019-10-01 DIAGNOSIS — D509 Iron deficiency anemia, unspecified: Secondary | ICD-10-CM

## 2019-10-01 DIAGNOSIS — C50211 Malignant neoplasm of upper-inner quadrant of right female breast: Secondary | ICD-10-CM

## 2019-10-01 DIAGNOSIS — D638 Anemia in other chronic diseases classified elsewhere: Secondary | ICD-10-CM

## 2019-10-01 DIAGNOSIS — Z170 Estrogen receptor positive status [ER+]: Secondary | ICD-10-CM

## 2019-10-01 LAB — SAMPLE TO BLOOD BANK

## 2019-10-01 LAB — CBC WITH DIFFERENTIAL (CANCER CENTER ONLY)
Abs Immature Granulocytes: 0.02 10*3/uL (ref 0.00–0.07)
Basophils Absolute: 0.1 10*3/uL (ref 0.0–0.1)
Basophils Relative: 1 %
Eosinophils Absolute: 0.2 10*3/uL (ref 0.0–0.5)
Eosinophils Relative: 4 %
HCT: 31.1 % — ABNORMAL LOW (ref 36.0–46.0)
Hemoglobin: 8.8 g/dL — ABNORMAL LOW (ref 12.0–15.0)
Immature Granulocytes: 0 %
Lymphocytes Relative: 11 %
Lymphs Abs: 0.7 10*3/uL (ref 0.7–4.0)
MCH: 27.2 pg (ref 26.0–34.0)
MCHC: 28.3 g/dL — ABNORMAL LOW (ref 30.0–36.0)
MCV: 96 fL (ref 80.0–100.0)
Monocytes Absolute: 0.6 10*3/uL (ref 0.1–1.0)
Monocytes Relative: 11 %
Neutro Abs: 4.1 10*3/uL (ref 1.7–7.7)
Neutrophils Relative %: 73 %
Platelet Count: 125 10*3/uL — ABNORMAL LOW (ref 150–400)
RBC: 3.24 MIL/uL — ABNORMAL LOW (ref 3.87–5.11)
RDW: 15.1 % (ref 11.5–15.5)
WBC Count: 5.7 10*3/uL (ref 4.0–10.5)
nRBC: 0 % (ref 0.0–0.2)

## 2019-10-01 MED ORDER — EPOETIN ALFA-EPBX 40000 UNIT/ML IJ SOLN
40000.0000 [IU] | Freq: Once | INTRAMUSCULAR | Status: AC
  Administered 2019-10-01: 40000 [IU] via SUBCUTANEOUS

## 2019-10-01 MED ORDER — EPOETIN ALFA-EPBX 40000 UNIT/ML IJ SOLN
INTRAMUSCULAR | Status: AC
  Filled 2019-10-01: qty 1

## 2019-10-01 NOTE — Assessment & Plan Note (Signed)
Patient has had longstanding anemia where the hemoglobin was between 10 to 12 g. Since April 2020 her hemoglobin has gotten worse During the same timeframe her creatinine got from 1.5-2.44  Current treatment:Retacritinjectionsweeklystarted7/31/2020 Bone marrow biopsy 04/08/2019: Normocellular bone marrow with trilineage hematopoiesis, absent iron stores, flow cytometry no abnormal B or T-cell clonal abnormalities noted.   IV iron therapy:Venofer weekly x4 04/24/19-05/15/19 Patient also has congestive heart failure and pleural effusions required Pleurx catheter placement. Lab review:Hemoglobin   Kidney stones: she lost significant amount of blood.  We will continue her weekly injections. Return to clinic weekly for Retacrit injections and in 1 month for follow-up with me. 

## 2019-10-01 NOTE — Patient Instructions (Signed)

## 2019-10-02 ENCOUNTER — Telehealth: Payer: Self-pay | Admitting: Hematology and Oncology

## 2019-10-02 ENCOUNTER — Ambulatory Visit: Payer: Medicare Other

## 2019-10-02 ENCOUNTER — Other Ambulatory Visit: Payer: Medicare Other

## 2019-10-02 ENCOUNTER — Other Ambulatory Visit: Payer: Self-pay | Admitting: Endocrinology

## 2019-10-02 ENCOUNTER — Ambulatory Visit: Payer: Medicare Other | Admitting: Hematology and Oncology

## 2019-10-02 NOTE — Telephone Encounter (Signed)
Scheduled per 04/23 los, patient has been called and voicemail was left. 

## 2019-10-05 NOTE — Progress Notes (Signed)
Electrophysiology Office Note Date: 10/06/2019  ID:  Hannah [name], DOB 03-Mar-1946, MRN [id number]  PCP: [name], DO Primary Cardiologist: Truitt Merle, NP Electrophysiologist: [name], MD   CC: ILR follow-up  Hannah [name] is a 74 y.o. female seen today for Dr. Lovena Le . she presents today for routine electrophysiology followup.  Since last being seen in our clinic, the patient reports doing very well. She has intermittent peripheral edema managed well on lasix 20 mg as needed, up to daily. She has chronic atypical chest pain that is not related to exertion, with no specific aggravating or relieving factors. She denies dyspnea, PND, orthopnea, nausea, vomiting, dizziness, syncope, edema, weight gain, or early satiety.  Device History: Medtronic loop recorder implanted 08/2017 for syncope  Past Medical History:  Diagnosis Date  . Abnormally small mouth   . Allergic rhinitis 10/14/2009   Qualifier: Diagnosis of  By: Lamonte Sakai MD, Rose Fillers   Overview:  Overview:  Qualifier: Diagnosis of  By: Lamonte Sakai MD, Rose Fillers  Last Assessment & Plan:  Please continue Xyzal and Nasacort as you have been using them  . Anemia   . Asthma 05/12/2009   10/12/2014 p extensive coaching HFA effectiveness =    75% s spacer    Overview:  Overview:  10/12/2014 p extensive coaching HFA effectiveness =    75% s spacer   Last Assessment & Plan:  Please continue Symbicort 2 puffs twice a day. Remember to rinse and gargle after taking this medication. Take albuterol 2 puffs up to every 4 hours if needed for shortness of breath.  Follow with Dr Lamonte Sakai in 6 month  . Bilateral carotid artery stenosis    a. mild 1-39% by duplex 10/2017, due 2021.  . Breast cancer ([place]) 01/18/2016   right breast  . CAD (coronary artery disease) 01/24/2011   a. s/p CABG 1998 with redo 2009.  Marland Kitchen Chemotherapy induced nausea and vomiting 02/24/2016  . Chemotherapy-induced peripheral neuropathy ([place]) 04/27/2016  . Chemotherapy-induced  thrombocytopenia 04/06/2016  . Chronic diastolic CHF (congestive heart failure) ([place]) 09/24/2013  . CKD (chronic kidney disease), stage IV ([place]) 09/24/2013  . Closed fracture of head of left humerus 09/09/2017  . Complication of anesthesia   . Dysrhythmia    a-fib  . Gallstones   . GERD (gastroesophageal reflux disease)   . Glaucoma   . H/O atrial tachycardia 05/12/2009   Qualifier: History of  By: Lamonte Sakai MD, Rose Fillers   Overview:  Overview:  Qualifier: History of  By: Lamonte Sakai MD, Rose Fillers  Last Assessment & Plan:  There was no evidence of this on her cardiac monitor. I would recommend watchful waiting.   Marland Kitchen Heart murmur   . History of kidney stones   . History of non-ST elevation myocardial infarction (NSTEMI)    Sept 2014--  thought to be type II HTN w/ LHC without infarct related artery and patent grafts  . History of radiation therapy 05/24/16-07/26/16   right breast 50.4 Gy in 28 fractions, right breast boost 10 Gy in 5 fractions  . Hyperlipidemia   . Hypotension   . Iron deficiency anemia   . Mania ([place]) 03/05/2016  . Moderate persistent asthma    pulmologist-  Dr. Malvin Johns  . Osteomyelitis of toe of left foot ([place]) 04/06/2016  . Osteopenia of multiple sites 10/19/2015  . PAF (paroxysmal atrial fibrillation) ([place])   . Personal history of chemotherapy 2017  . Personal history of radiation therapy 2017  . PONV (postoperative  nausea and vomiting)   . Port catheter in place 02/17/2016  . Psoriasis    right leg  . Renal calculus, right   . S/P AVR    prosthesis valve placement 2009 at same time re-do CABG  . Seizure-like activity ([place]) 09/01/2017  . Sensorineural hearing loss (SNHL) of both ears 01/06/2016  . Stroke [facility]) 2014   residual rt hearing loss  . Syncope 09/06/2017  . Type 2 diabetes mellitus ([place])    monitored by dr Dwyane Dee   Past Surgical History:  Procedure Laterality Date  . AORTIC VALVE REPLACEMENT  2009   [facility]done same time is CAB[facility])  .  BREAST LUMPECTOMY Right 01/18/2016  . BREAST LUMPECTOMY WITH RADIOACTIVE SEED AND SENTINEL LYMPH NODE BIOPSY Right 01/18/2016   Procedure: RIGHT BREAST LUMPECTOMY WITH RADIOACTIVE SEED AND SENTINEL LYMPH NODE BIOPSY;  Surgeon: DAlphonsa Overall MD;  [facility]  Service: General;  Laterality: Right;  . CARDIAC CATHETERIZATION  03/23/2008   Pre-redo CABG: L main OK, LAD (T), CFX (T), OM1 99%, RCA (T), LIMA-LAD OK, SVG-OM(?3) OK w/ little florw to OM2, SVG-RCA OK. EF NL  . CARPAL TUNNEL RELEASE    . CHEST TUBE INSERTION Right 06/26/2019   Procedure: INSERTION PLEURAL DRAINAGE CATHETER;  Surgeon: BGaye Pollack MD;  [facility]  Service: Thoracic;  Laterality: Right;  . CHOLECYSTECTOMY N/A 07/07/2018   Procedure: LAPAROSCOPIC CHOLECYSTECTOMY WITH INTRAOPERATIVE CHOLANGIOGRAM ERAS PATHWAY;  Surgeon: NAlphonsa Overall MD;  [facility];  Service: General;  Laterality: N/A;  . COLONOSCOPY     around 2015. Possibly with [facility]   . CORONARY ARTERY BYPASS GRAFT  1998 &  re-do 2009   Had LIMA to DX/LAD, SVG to 2 marginal branches and [facility]; SVG to 3rd OM and PD at time of redo  . CYSTOSCOPY W/ URETERAL STENT PLACEMENT Right 12/20/2014   Procedure: CYSTOSCOPY WITH RETROGRADE PYELOGRAM/URETERAL STENT PLACEMENT;  Surgeon: PCleon Gustin MD;  [facility]  Service: Urology;  Laterality: Right;  . CYSTOSCOPY WITH RETROGRADE PYELOGRAM, URETEROSCOPY AND STENT PLACEMENT Left 08/21/2019   Procedure: CYSTOSCOPY WITH RETROGRADE PYELOGRAM, URETEROSCOPY AND STENT PLACEMENT;  Surgeon: MCleon Gustin MD;  [facility];  Service: Urology;  Laterality: Left;  1 HR  . ESOPHAGOGASTRODUODENOSCOPY     many years ago per patient   . ESOPHAGOGASTRODUODENOSCOPY ENDOSCOPY  06/17/2018  . EYE SURGERY Bilateral    cataracts  . HOLMIUM LASER APPLICATION Right 79/35/7017  Procedure:  HOLMIUM LASER LITHOTRIPSY;  Surgeon: PCleon Gustin MD;  [facility]  Service: Urology;  Laterality: Right;  . HOLMIUM LASER APPLICATION Left 37/93/9030  Procedure: HOLMIUM LASER APPLICATION;  Surgeon: MCleon Gustin MD;  [facility];  Service: Urology;  Laterality: Left;  . IR THORACENTESIS ASP PLEURAL SPACE W/IMG GUIDE  10/03/2018  . IR THORACENTESIS ASP PLEURAL SPACE W/IMG GUIDE  06/08/2019  . LEFT HEART CATHETERIZATION WITH CORONARY/GRAFT ANGIOGRAM N/A 02/23/2013   Procedure: LEFT HEART CATHETERIZATION WITH CBeatrix Fetters  Surgeon: MBlane Ohara MD;  [facility];  Service: Cardiovascular;  Laterality: N/A;  . LOOP RECORDER INSERTION N/A 08/30/2017   Procedure: LOOP RECORDER INSERTION;  Surgeon: TEvans Lance MD;  [facility];  Service: Cardiovascular;  Laterality: N/A;  . PORTACATH PLACEMENT Left 01/18/2016   Procedure: INSERTION PORT-A-CATH;  Surgeon: DAlphonsa Overall MD;  [facility]  Service: General;  Laterality: Left;  .  portacath removal    . REMOVAL OF PLEURAL DRAINAGE CATHETER Right 07/14/2019   Procedure: REMOVAL OF PLEURAL DRAINAGE CATHETER;  Surgeon: Gaye Pollack, MD;  Location: [place];  Service: Thoracic;  Laterality: Right;  . TALC PLEURODESIS N/A 06/26/2019   Procedure: Pietro Cassis;  Surgeon: Gaye Pollack, MD;  [facility];  Service: Thoracic;  Laterality: N/A;  . TONSILLECTOMY    . TRANSTHORACIC ECHOCARDIOGRAM  02-24-2013      mild LVH,  ef 50-55%/  AV bioprosthesis was present with very mild stenosis and no regurg., mean grandient 33mHg, peak grandient 248mg /  mild MR/  mild LAE and RAE/  moderate TR  . TUBAL LIGATION      Current Outpatient Medications  Medication Sig Dispense Refill  . ACCU-CHEK GUIDE test strip Use to test blood sugar 3 times daily 150 strip 3  . acetaminophen (TYLENOL) 325 MG tablet Take 2 tablets (650 mg total) by mouth every 6 (six) hours as needed for mild pain (or Fever >/= 101).    . Marland Kitchenlbuterol (PROVENTIL HFA;VENTOLIN HFA) 108 (90 Base)  MCG/ACT inhaler Inhale 2 puffs into the lungs every 6 (six) hours as needed for wheezing or shortness of breath.    . Marland Kitchenmiodarone (PACERONE) 200 MG tablet Take 1 tablet (200 mg total) by mouth daily. 90 tablet 3  . Blood Glucose Monitoring Suppl (ACCU-CHEK GUIDE ME) w/Device KIT Use accu chek meter to check blood sugar three times daily. 1 kit 0  . budesonide-formoterol (SYMBICORT) 160-4.5 MCG/ACT inhaler Inhale 2 puffs into the lungs 2 (two) times daily.    . Marland KitchenLIQUIS 5 MG TABS tablet Take 1 tablet (5 mg total) by mouth 2 (two) times daily. 180 tablet 1  . epoetin alfa-epbx (RETACRIT) 4046962NIT/ML injection Inject 40,000 Units into the skin every Thursday.     . Marland Kitchenxemestane (AROMASIN) 25 MG tablet TAKE ONE TABLET BY MOUTH DAILY AFTER BREAKFAST 90 tablet 3  . ezetimibe (ZETIA) 10 MG tablet Take 10 mg by mouth daily.    . famotidine (PEPCID) 20 MG tablet Take 20 mg by mouth 2 (two) times daily.    . furosemide (LASIX) 20 MG tablet Take 20 mg by mouth daily as needed.    . Marland KitchenYDROcodone-acetaminophen (NORCO/VICODIN) 5-325 MG tablet Take 1 tablet by mouth every 8 (eight) hours as needed for severe pain.    . Marland Kitchennsulin glargine (LANTUS) 100 UNIT/ML injection Inject 18 Units into the skin daily.    . Insulin Lispro (HUMALOG KWIKPEN [place]) Inject 4-6 Units into the skin 3 (three) times daily.    . Insulin Pen Needle (BD PEN NEEDLE NANO U/F) 32G X 4 MM MISC USE AS INSTRUCTED TO INJECT INSULIN 4 TIMES DAILY. 360 each 1  . loratadine (CLARITIN) 10 MG tablet Take 10 mg by mouth daily.     . metoprolol tartrate (LOPRESSOR) 25 MG tablet Take 1 tablet (25 mg total) by mouth 2 (two) times daily. 180 tablet 3  . midodrine (PROAMATINE) 2.5 MG tablet Take 2.5 mg by mouth 3 (three) times daily with meals.    . nitroGLYCERIN (NITROSTAT) 0.4 MG SL tablet Place 1 tablet (0.4 mg total) under the tongue every 5 (five) minutes as needed for chest pain. 30 tablet 3  . rosuvastatin (CRESTOR) 40 MG tablet Take 1 tablet (40 mg total)  by mouth at bedtime. 90 tablet 3  . zolpidem (AMBIEN) 5 MG tablet Take 5 mg by mouth at bedtime.      No current facility-administered  medications for this visit.    Allergies:   Amoxicillin, Tape, Aldactone [spironolactone], Imdur [isosorbide dinitrate], Arimidex [anastrozole], Latex, and Tetracycline   Social History: Social History   Socioeconomic History  . Marital status: Married    Spouse name: Not on file  . Number of children: 2  . Years of education: Not on file  . Highest education level: Not on file  Occupational History  . Occupation: retired  Tobacco Use  . Smoking status: Never Smoker  . Smokeless tobacco: Never Used  Substance and Sexual Activity  . Alcohol use: No  . Drug use: No  . Sexual activity: Yes    Birth control/protection: None  Other Topics Concern  . Not on file  Social History Narrative  . Not on file   Social Determinants of Health   Financial Resource Strain:   . Difficulty of Paying Living Expenses:   Food Insecurity:   . Worried About Charity fundraiser in the Last Year:   . Arboriculturist in the Last Year:   Transportation Needs:   . Film/video editor (Medical):   Marland Kitchen Lack of Transportation (Non-Medical):   Physical Activity:   . Days of Exercise per Week:   . Minutes of Exercise per Session:   Stress:   . Feeling of Stress :   Social Connections:   . Frequency of Communication with Friends and Family:   . Frequency of Social Gatherings with Friends and Family:   . Attends Religious Services:   . Active Member of Clubs or Organizations:   . Attends Archivist Meetings:   Marland Kitchen Marital Status:   Intimate Partner Violence:   . Fear of Current or Ex-Partner:   . Emotionally Abused:   Marland Kitchen Physically Abused:   . Sexually Abused:     Family History: Family History  Problem Relation Age of Onset  . Heart disease Father   . Heart failure Father   . Diabetes Maternal Grandmother   . Heart disease Maternal Grandmother    . Diabetes Son   . Healthy Brother        #1  . Heart attack Brother        #2  . Heart disease Brother        #2  . Colon cancer Neg Hx   . Esophageal cancer Neg Hx      Review of Systems: All other systems reviewed and are otherwise negative except as noted above.  Physical Exam: Vitals:   10/06/19 0801  BP: (!) 108/50  Pulse: (!) 58  SpO2: 99%  Weight: 146 lb 1.9 oz (66.3 kg)  Height: _0  (1.651 m)     GEN- The patient is well appearing, alert and oriented x 3 today.   HEENT: normocephalic, atraumatic; sclera clear, conjunctiva pink; hearing intact; oropharynx clear; neck supple  Lungs- Clear to ausculation bilaterally, normal work of breathing.  No wheezes, rales, rhonchi Heart- Regular rate and rhythm, no murmurs, rubs or gallops  GI- soft, non-tender, non-distended, bowel sounds present  Extremities- no clubbing, cyanosis, or edema  MS- no significant deformity or atrophy Skin- warm and dry, no rash or lesion; PPM pocket well healed Psych- euthymic mood, full affect Neuro- strength and sensation are intact  PPM Interrogation- reviewed in detail today,  See PACEART report  EKG:  EKG is not ordered today.  Recent Labs: 02/20/2019: Magnesium 2.1 02/23/2019: NT-Pro BNP 2,825 03/04/2019: B Natriuretic Peptide 273.4 08/03/2019: ALT 24; TSH 3.090 09/22/2019: BUN  36; Creatinine, Ser 2.07; Potassium 5.1; Sodium 142 10/01/2019: Hemoglobin 8.8; Platelet Count 125   Wt Readings from Last 3 Encounters:  10/06/19 146 lb 1.9 oz (66.3 kg)  10/01/19 147 lb 8 oz (66.9 kg)  09/09/19 141 lb 1.9 oz (64 kg)     Other studies Reviewed: Additional studies/ records that were reviewed today include: Echo 05/2019 shows LVEF normal, Previous EP office notes, Previous remote checks, Most recent labwork.   Assessment and Plan:  1. Syncope s/p Medtronic Loop recorder Normal device function See Pace Art report No changes today  2. CAD s/p CABG She has chronic atypical chest pain,  not related to exertion.  Managed medically. High risk for cath with CKD IV  3. PAF Continue amiodarone. Surveillance labs in February stable.  Continue eliquis for CHA2DS2VASC of at least 6    4. HTN Continue current regimen.  5. Hyperkalemia K 5.1 earlier this month. K supp stopped. She has repeat visit and labs next week. Also follows with nephrology.   Current medicines are reviewed at length with the patient today.   The patient does not have concerns regarding her medicines.  The following changes were made today:  none   Labs/ tests ordered today include:  Orders Placed This Encounter  Procedures  . CUP PACEART INCLINIC DEVICE CHECK     Disposition:   Follow up with Dr. Lovena Le in 12 Months   Signed, Annamaria Helling  10/06/2019 8:20 AM  [facility] [address] [place] [address] [phone] (office) [phone] (fax)

## 2019-10-06 ENCOUNTER — Ambulatory Visit (INDEPENDENT_AMBULATORY_CARE_PROVIDER_SITE_OTHER): Payer: Medicare Other | Admitting: Student

## 2019-10-06 ENCOUNTER — Encounter: Payer: Self-pay | Admitting: Student

## 2019-10-06 ENCOUNTER — Other Ambulatory Visit: Payer: Self-pay

## 2019-10-06 ENCOUNTER — Encounter: Payer: Medicare Other | Admitting: Student

## 2019-10-06 VITALS — BP 108/50 | HR 58 | Ht 65.0 in | Wt 146.1 lb

## 2019-10-06 DIAGNOSIS — E875 Hyperkalemia: Secondary | ICD-10-CM | POA: Diagnosis not present

## 2019-10-06 DIAGNOSIS — I2510 Atherosclerotic heart disease of native coronary artery without angina pectoris: Secondary | ICD-10-CM | POA: Diagnosis not present

## 2019-10-06 DIAGNOSIS — I5032 Chronic diastolic (congestive) heart failure: Secondary | ICD-10-CM | POA: Diagnosis not present

## 2019-10-06 DIAGNOSIS — R55 Syncope and collapse: Secondary | ICD-10-CM

## 2019-10-06 DIAGNOSIS — N184 Chronic kidney disease, stage 4 (severe): Secondary | ICD-10-CM

## 2019-10-06 LAB — CUP PACEART INCLINIC DEVICE CHECK
Date Time Interrogation Session: 20210427081817
Implantable Pulse Generator Implant Date: 20190322

## 2019-10-06 LAB — HM DIABETES EYE EXAM

## 2019-10-06 NOTE — Patient Instructions (Signed)
Medication Instructions:  NONE *If you need a refill on your cardiac medications before your next appointment, please call your pharmacy*   Lab Work: NONE If you have labs (blood work) drawn today and your tests are completely normal, you will receive your results only by: Marland Kitchen MyChart Message (if you have MyChart) OR . A paper copy in the mail If you have any lab test that is abnormal or we need to change your treatment, we will call you to review the results.   Testing/Procedures: NONE   Follow-Up: At [facility], you and your health needs are our priority.  As part of our continuing mission to provide you with exceptional heart care, we have created designated Provider Care Teams.  These Care Teams include your primary Cardiologist (physician) and Advanced Practice Providers (APPs -  Physician Assistants and Nurse Practitioners) who all work together to provide you with the care you need, when you need it.   Your next appointment:   1 year(s)  The format for your next appointment:   Either In Person or Virtual  Provider:   Dr Lovena Le   Other Instructions

## 2019-10-07 ENCOUNTER — Encounter: Payer: Self-pay | Admitting: Emergency Medicine

## 2019-10-07 ENCOUNTER — Ambulatory Visit (INDEPENDENT_AMBULATORY_CARE_PROVIDER_SITE_OTHER): Payer: Medicare Other | Admitting: Emergency Medicine

## 2019-10-07 DIAGNOSIS — J4530 Mild persistent asthma, uncomplicated: Secondary | ICD-10-CM

## 2019-10-07 MED ORDER — BUDESONIDE-FORMOTEROL FUMARATE 160-4.5 MCG/ACT IN AERO: 2.0000 | INHALATION_SPRAY | Freq: Two times a day (BID) | RESPIRATORY_TRACT | 5 refills | Status: DC

## 2019-10-07 MED ORDER — ALBUTEROL SULFATE HFA 108 (90 BASE) MCG/ACT IN AERS: 2.0000 | INHALATION_SPRAY | Freq: Four times a day (QID) | RESPIRATORY_TRACT | 5 refills | Status: DC | PRN

## 2019-10-07 NOTE — Progress Notes (Signed)
Virtual Visit via Telephone Note  I connected with Hannah [name] on 10/07/19 at  3:15 PM EDT by telephone and verified that I am speaking with the correct person using two identifiers.  Location: Patient: Home Provider: Office   I discussed the limitations, risks, security and privacy concerns of performing an evaluation and management service by telephone and the availability of in person appointments. I also discussed with the patient that there may be a patient responsible charge related to this service. The patient expressed understanding and agreed to proceed.   History of Present Illness: 74 year old obese woman with multifactorial dyspnea in setting of asthma, prior right transudate if pleural effusion in the setting of CAD/CABG, AVR, A. fib (on amiodarone).  She also has a history of breast cancer.   Observations/Objective: She reports today that she underwent talc pleurodesis w Dr Cyndia Bent in February - has benefited significantly. Her breathing has been good. No flares reported, no prednisone.  She remains on Symbicort - feels benefit from it. She rarely needs albuterol - hers is out of date.  Has not had her COVID vaccines yet - is interested in getting this   Assessment and Plan: Asthma.  Well compensated.  No exacerbations. Continue Symbicort twice daily, will refill her albuterol which is now out of date Plan to follow-up in 6 months  Multifactorial dyspnea with influence of her asthma but also her chronic recurrent transudate of right pleural effusion.  Her breathing is much improved since she underwent talc pleurodesis.  I have asked her to work on continuing to increase her exercise and conditioning  Health maintenance She needs the COVID-19 vaccine.  She is interested in getting it.  She is going to pursue this.   Follow Up Instructions: Follow with Dr Lamonte Sakai in 6 months or sooner if you have any problems    I discussed the assessment and treatment plan with the  patient. The patient was provided an opportunity to ask questions and all were answered. The patient agreed with the plan and demonstrated an understanding of the instructions.   The patient was advised to call back or seek an in-person evaluation if the symptoms worsen or if the condition fails to improve as anticipated.  I provided 14 minutes of non-face-to-face time during this encounter.   Collene Gobble, MD

## 2019-10-08 ENCOUNTER — Other Ambulatory Visit: Payer: Self-pay

## 2019-10-08 ENCOUNTER — Other Ambulatory Visit: Payer: Medicare Other

## 2019-10-08 ENCOUNTER — Inpatient Hospital Stay: Payer: Medicare Other

## 2019-10-08 ENCOUNTER — Ambulatory Visit: Payer: Medicare Other

## 2019-10-08 VITALS — BP 126/48 | HR 58 | Temp 98.5°F | Resp 18

## 2019-10-08 DIAGNOSIS — D638 Anemia in other chronic diseases classified elsewhere: Secondary | ICD-10-CM

## 2019-10-08 DIAGNOSIS — I129 Hypertensive chronic kidney disease with stage 1 through stage 4 chronic kidney disease, or unspecified chronic kidney disease: Secondary | ICD-10-CM | POA: Diagnosis not present

## 2019-10-08 LAB — CBC WITH DIFFERENTIAL (CANCER CENTER ONLY)
Abs Immature Granulocytes: 0.01 10*3/uL (ref 0.00–0.07)
Basophils Absolute: 0.1 10*3/uL (ref 0.0–0.1)
Basophils Relative: 1 %
Eosinophils Absolute: 0.2 10*3/uL (ref 0.0–0.5)
Eosinophils Relative: 4 %
HCT: 30.7 % — ABNORMAL LOW (ref 36.0–46.0)
Hemoglobin: 8.7 g/dL — ABNORMAL LOW (ref 12.0–15.0)
Immature Granulocytes: 0 %
Lymphocytes Relative: 10 %
Lymphs Abs: 0.6 10*3/uL — ABNORMAL LOW (ref 0.7–4.0)
MCH: 26.4 pg (ref 26.0–34.0)
MCHC: 28.3 g/dL — ABNORMAL LOW (ref 30.0–36.0)
MCV: 93.3 fL (ref 80.0–100.0)
Monocytes Absolute: 0.7 10*3/uL (ref 0.1–1.0)
Monocytes Relative: 12 %
Neutro Abs: 3.9 10*3/uL (ref 1.7–7.7)
Neutrophils Relative %: 73 %
Platelet Count: 123 10*3/uL — ABNORMAL LOW (ref 150–400)
RBC: 3.29 MIL/uL — ABNORMAL LOW (ref 3.87–5.11)
RDW: 15.5 % (ref 11.5–15.5)
WBC Count: 5.4 10*3/uL (ref 4.0–10.5)
nRBC: 0 % (ref 0.0–0.2)

## 2019-10-08 LAB — SAMPLE TO BLOOD BANK

## 2019-10-08 MED ORDER — EPOETIN ALFA-EPBX 40000 UNIT/ML IJ SOLN
40000.0000 [IU] | Freq: Once | INTRAMUSCULAR | Status: AC
  Administered 2019-10-08: 40000 [IU] via SUBCUTANEOUS

## 2019-10-08 MED ORDER — EPOETIN ALFA-EPBX 40000 UNIT/ML IJ SOLN
INTRAMUSCULAR | Status: AC
  Filled 2019-10-08: qty 1

## 2019-10-08 NOTE — Patient Instructions (Signed)

## 2019-10-12 ENCOUNTER — Other Ambulatory Visit: Payer: Self-pay

## 2019-10-12 NOTE — Telephone Encounter (Signed)
S/w pt about pt's daughter in law.  Pt is scheduled to see Truitt Merle, NP on  Wednesday, May 5 @ 3:15 pm.  Information already in [place].

## 2019-10-14 ENCOUNTER — Encounter: Payer: Self-pay | Admitting: Endocrinology

## 2019-10-14 ENCOUNTER — Encounter: Payer: Self-pay | Admitting: Nurse Practitioner

## 2019-10-14 ENCOUNTER — Ambulatory Visit (INDEPENDENT_AMBULATORY_CARE_PROVIDER_SITE_OTHER): Payer: Medicare Other | Admitting: Nurse Practitioner

## 2019-10-14 ENCOUNTER — Other Ambulatory Visit: Payer: Self-pay

## 2019-10-14 ENCOUNTER — Ambulatory Visit (INDEPENDENT_AMBULATORY_CARE_PROVIDER_SITE_OTHER): Payer: Medicare Other | Admitting: Endocrinology

## 2019-10-14 VITALS — BP 144/42 | HR 62 | Ht 65.0 in | Wt 147.0 lb

## 2019-10-14 VITALS — BP 110/34 | HR 63 | Ht 65.0 in | Wt 149.0 lb

## 2019-10-14 DIAGNOSIS — J90 Pleural effusion, not elsewhere classified: Secondary | ICD-10-CM | POA: Diagnosis not present

## 2019-10-14 DIAGNOSIS — E1142 Type 2 diabetes mellitus with diabetic polyneuropathy: Secondary | ICD-10-CM | POA: Diagnosis not present

## 2019-10-14 DIAGNOSIS — Z952 Presence of prosthetic heart valve: Secondary | ICD-10-CM

## 2019-10-14 DIAGNOSIS — Z79899 Other long term (current) drug therapy: Secondary | ICD-10-CM

## 2019-10-14 DIAGNOSIS — Z794 Long term (current) use of insulin: Secondary | ICD-10-CM | POA: Diagnosis not present

## 2019-10-14 DIAGNOSIS — I2510 Atherosclerotic heart disease of native coronary artery without angina pectoris: Secondary | ICD-10-CM

## 2019-10-14 DIAGNOSIS — I5032 Chronic diastolic (congestive) heart failure: Secondary | ICD-10-CM

## 2019-10-14 DIAGNOSIS — E1165 Type 2 diabetes mellitus with hyperglycemia: Secondary | ICD-10-CM

## 2019-10-14 MED ORDER — ISOSORBIDE MONONITRATE ER 30 MG PO TB24
15.0000 mg | ORAL_TABLET | Freq: Every day | ORAL | 3 refills | Status: DC
Start: 2019-10-14 — End: 2019-11-04

## 2019-10-14 NOTE — Progress Notes (Signed)
Per instructions of Dr. Dwyane Dee, pt was given sample of Trulicity 0.75mg  pen injections.  Container has two pens, which is 2 weeks worth of medication.  Sample was labeled appropriately and given to pt.  WUG:[id number] J EX:03/23/2021

## 2019-10-14 NOTE — Patient Instructions (Signed)
After Visit Summary:  We will be checking the following labs today - NONE   Medication Instructions:    Continue with your current medicines. BUT  We are going to try and add back low dose Imdur 30 mg - to take just 1/2 tablet a day - this may cause a headache - this may make your blood pressure little lower  Lets not increase the Lasix until we see how you do with taking the Imdur   If you need a refill on your cardiac medications before your next appointment, please call your pharmacy.     Testing/Procedures To Be Arranged:  N/A  Follow-Up:   See me in about 3 weeks    At [facility], you and your health needs are our priority.  As part of our continuing mission to provide you with exceptional heart care, we have created designated Provider Care Teams.  These Care Teams include your primary Cardiologist (physician) and Advanced Practice Providers (APPs -  Physician Assistants and Nurse Practitioners) who all work together to provide you with the care you need, when you need it.  Special Instructions:  . Stay safe, stay home, wash your hands for at least 20 seconds and wear a mask when out in public.  . It was good to talk with you today. Marland Kitchen Keep a closer check on your BP for me.     Call the [place] office at [phone] if you have any questions, problems or concerns.

## 2019-10-14 NOTE — Progress Notes (Signed)
Patient ID: Hannah [name], female   DOB: 1946-03-07, 74 y.o.   MRN: [id number]           Reason for Appointment: Follow-up for Type 2 Diabetes   History of Present Illness:          Date of diagnosis of type 2 diabetes mellitus: Late 1980s        Background history:  She was previously treated with metformin prior to starting insulin She thinks she has been on insulin for at least 10 years and mostly taking Lantus and other types of insulin with it Her metformin was stopped when she started insulin and also has had renal dysfunction Previously her A1c had been consistently over 8% and occasionally higher.  Recent history:   INSULIN regimen is described as:  Lantus 18 units in am, Humalog 4-6 units before meals  Non-insulin hypoglycemic drugs the patient is taking are: None, previously was on Trulicity 1.5 mg weekly  She had been on the V-go pump since 03/2015, this was stopped in 3/20  Current diabetes management, blood sugar patterns and problems identified:   Her A1c is usually lower than expected, last 6.1 compared to 7.1  Fructosamine previously 276   Lantus was unchanged on the last visit  She is taking NovoLog consistently now before meals  Also has gone up 2 units at least on the morning dose  Blood sugars are fluctuating significantly in the afternoons and evenings  However blood sugars are more consistently higher in the afternoons and averaging over 200  She is concerned about her sugar going up nonfasting  She also has difficulty adjusting her dose at suppertime with marked variability in blood sugars, occasionally over 300  She is mostly eating 2 meals a day and usually only a snack at lunchtime  She is checking blood sugars about 3 times a day on average according to her meter download  Blood sugar patterns were discussed with patient  Recently has not lost any more weight although has had some edema recently   Blood sugar readings, averages,  analysis as below:  Side effects from medications have been: None  Compliance with the medical regimen: Fair  Glucose monitoring:  done about 2-3 times a day         Glucometer:  Accu-Chek Aviva  Blood Glucose readings by download of her meter for the last 4 weeks:   PRE-MEAL Fasting Lunch Dinner Bedtime Overall  Glucose range:  106-193  79-211  154-343  81-312   Mean/median:  140    176 172   POST-MEAL PC Breakfast PC Lunch PC Dinner  Glucose range:    63-211  Mean/median:    136    Previous readings:  PRE-MEAL Fasting Lunch Dinner Bedtime Overall  Glucose range:  121-256  182-293  114-309  144-272   Mean/median:  170    238  197      Self-care: The diet that the patient has been following is: tries to limit fats and carbs.  She uses diet green tea or diet drinks and no sugar in drinks    Lunch 1-2 pm Dinner 6-7 PM  Typical meal intake: Breakfast is oatrmeal or cheese toast              Dietician visit, most recent: 5/16 And in 7/16 with oncology dietitian    Weight history:  Wt Readings from Last 3 Encounters:  10/14/19 149 lb (67.6 kg)  10/14/19 147 lb (66.7 kg)  10/06/19  146 lb 1.9 oz (66.3 kg)    Glycemic control:      Lab Results  Component Value Date   HGBA1C 6.1 (H) 08/13/2019   HGBA1C 7.1 (H) 07/02/2019   HGBA1C 5.6 02/09/2019   Lab Results  Component Value Date   MICROALBUR 14.2 (H) 07/02/2019   LDLCALC 53 12/02/2018   CREATININE 2.07 (H) 09/22/2019       Allergies as of 10/14/2019      Reactions   Amoxicillin Rash, Other (See Comments)   Tolerates Cephalosporins Has patient had a PCN reaction causing immediate rash, facial/tongue/throat swelling, SOB or lightheadedness with hypotension: Yes Has patient had a PCN reaction causing severe rash involving mucus membranes or skin necrosis: Yes Has patient had a PCN reaction that required hospitalization No Has patient had a PCN reaction occurring within the last 10 years: No If all of the  above answers are "NO", then may proceed with Cephalosporin use.   Tape Other (See Comments)   Must use paper tape   Aldactone [spironolactone] Other (See Comments)   CKD/hypokalemia   Imdur [isosorbide Dinitrate] Other (See Comments)   Headache/severe hypotension/Syncope   Arimidex [anastrozole] Nausea Only   Latex Itching, Other (See Comments)   (Dentist office)   Tetracycline Rash      Medication List       Accurate as of Oct 14, 2019  3:51 PM. If you have any questions, ask your nurse or doctor.        Accu-Chek Guide Me w/Device Kit Use accu chek meter to check blood sugar three times daily.   Accu-Chek Guide test strip Generic drug: glucose blood Use to test blood sugar 3 times daily   acetaminophen 325 MG tablet Commonly known as: TYLENOL Take 2 tablets (650 mg total) by mouth every 6 (six) hours as needed for mild pain (or Fever >/= 101).   albuterol 108 (90 Base) MCG/ACT inhaler Commonly known as: VENTOLIN HFA Inhale 2 puffs into the lungs every 6 (six) hours as needed for wheezing or shortness of breath.   amiodarone 200 MG tablet Commonly known as: PACERONE Take 1 tablet (200 mg total) by mouth daily.   BD Pen Needle Nano U/F 32G X 4 MM Misc Generic drug: Insulin Pen Needle USE AS INSTRUCTED TO INJECT INSULIN 4 TIMES DAILY.   budesonide-formoterol 160-4.5 MCG/ACT inhaler Commonly known as: SYMBICORT Inhale 2 puffs into the lungs 2 (two) times daily.   Eliquis 5 MG Tabs tablet Generic drug: apixaban Take 1 tablet (5 mg total) by mouth 2 (two) times daily.   epoetin alfa-epbx 40000 UNIT/ML injection Commonly known as: RETACRIT Inject 40,000 Units into the skin every Thursday.   exemestane 25 MG tablet Commonly known as: AROMASIN TAKE ONE TABLET BY MOUTH DAILY AFTER BREAKFAST   ezetimibe 10 MG tablet Commonly known as: ZETIA Take 10 mg by mouth daily.   famotidine 20 MG tablet Commonly known as: PEPCID Take 20 mg by mouth 2 (two) times daily.     furosemide 20 MG tablet Commonly known as: LASIX Take 20 mg by mouth daily as needed.   HUMALOG KWIKPEN Barrera Inject 4-6 Units into the skin 3 (three) times daily.   HYDROcodone-acetaminophen 5-325 MG tablet Commonly known as: NORCO/VICODIN Take 1 tablet by mouth every 8 (eight) hours as needed for severe pain.   insulin glargine 100 UNIT/ML injection Commonly known as: LANTUS Inject 18 Units into the skin daily.   isosorbide mononitrate 30 MG 24 hr tablet Commonly known as:  IMDUR Take 0.5 tablets (15 mg total) by mouth daily. Started by: Truitt Merle, NP   loratadine 10 MG tablet Commonly known as: CLARITIN Take 10 mg by mouth daily.   metoprolol tartrate 25 MG tablet Commonly known as: LOPRESSOR Take 1 tablet (25 mg total) by mouth 2 (two) times daily.   midodrine 2.5 MG tablet Commonly known as: PROAMATINE Take 2.5 mg by mouth 3 (three) times daily with meals.   nitroGLYCERIN 0.4 MG SL tablet Commonly known as: NITROSTAT Place 1 tablet (0.4 mg total) under the tongue every 5 (five) minutes as needed for chest pain.   rosuvastatin 40 MG tablet Commonly known as: CRESTOR Take 1 tablet (40 mg total) by mouth at bedtime.   zolpidem 5 MG tablet Commonly known as: AMBIEN Take 5 mg by mouth at bedtime.       Allergies:  Allergies  Allergen Reactions  . Amoxicillin Rash and Other (See Comments)    Tolerates Cephalosporins Has patient had a PCN reaction causing immediate rash, facial/tongue/throat swelling, SOB or lightheadedness with hypotension: Yes Has patient had a PCN reaction causing severe rash involving mucus membranes or skin necrosis: Yes Has patient had a PCN reaction that required hospitalization No Has patient had a PCN reaction occurring within the last 10 years: No If all of the above answers are "NO", then may proceed with Cephalosporin use.   . Tape Other (See Comments)    Must use paper tape  . Aldactone [Spironolactone] Other (See Comments)     CKD/hypokalemia  . Imdur [Isosorbide Dinitrate] Other (See Comments)    Headache/severe hypotension/Syncope  . Arimidex [Anastrozole] Nausea Only  . Latex Itching and Other (See Comments)    (Dentist office)  . Tetracycline Rash    Past Medical History:  Diagnosis Date  . Abnormally small mouth   . Allergic rhinitis 10/14/2009   Qualifier: Diagnosis of  By: Lamonte Sakai MD, Rose Fillers   Overview:  Overview:  Qualifier: Diagnosis of  By: Lamonte Sakai MD, Rose Fillers  Last Assessment & Plan:  Please continue Xyzal and Nasacort as you have been using them  . Anemia   . Asthma 05/12/2009   10/12/2014 p extensive coaching HFA effectiveness =    75% s spacer    Overview:  Overview:  10/12/2014 p extensive coaching HFA effectiveness =    75% s spacer   Last Assessment & Plan:  Please continue Symbicort 2 puffs twice a day. Remember to rinse and gargle after taking this medication. Take albuterol 2 puffs up to every 4 hours if needed for shortness of breath.  Follow with Dr Lamonte Sakai in 6 month  . Bilateral carotid artery stenosis    a. mild 1-39% by duplex 10/2017, due 2021.  . Breast cancer ([place]) 01/18/2016   right breast  . CAD (coronary artery disease) 01/24/2011   a. s/p CABG 1998 with redo 2009.  Marland Kitchen Chemotherapy induced nausea and vomiting 02/24/2016  . Chemotherapy-induced peripheral neuropathy ([place]) 04/27/2016  . 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 Chronic diastolic CHF (congestive heart failure) ([place]) 09/24/2013  . CKD (chronic kidney disease), stage IV ([place]) 09/24/2013  . Closed fracture of head of left humerus 09/09/2017  . Complication of anesthesia   . Dysrhythmia    a-fib  . Gallstones   . GERD (gastroesophageal reflux disease)   . Glaucoma   . H/O atrial tachycardia 05/12/2009   Qualifier: History of  By: Lamonte Sakai MD, Rose Fillers   Overview:  Overview:  Qualifier: History of  By:  Byrum MD, Rose Fillers  Last Assessment & Plan:  There was no evidence of this on her cardiac monitor. I would recommend  watchful waiting.   Marland Kitchen Heart murmur   . History of kidney stones   . History of non-ST elevation myocardial infarction (NSTEMI)    Sept 2014--  thought to be type II HTN w/ LHC without infarct related artery and patent grafts  . History of radiation therapy 05/24/16-07/26/16   right breast 50.4 Gy in 28 fractions, right breast boost 10 Gy in 5 fractions  . Hyperlipidemia   . Hypotension   . Iron deficiency anemia   . Mania ([place]) 03/05/2016  . Moderate persistent asthma    pulmologist-  Dr. Malvin Johns  . Osteomyelitis of toe of left foot ([place]) 04/06/2016  . Osteopenia of multiple sites 10/19/2015  . PAF (paroxysmal atrial fibrillation) ([place])   . Personal history of chemotherapy 2017  . Personal history of radiation therapy 2017  . PONV (postoperative nausea and vomiting)   . Port catheter in place 02/17/2016  . Psoriasis    right leg  . Renal calculus, right   . S/P AVR    prosthesis valve placement 2009 at same time re-do CABG  . Seizure-like activity ([place]) 09/01/2017  . Sensorineural hearing loss (SNHL) of both ears 01/06/2016  . Stroke [facility]) 2014   residual rt hearing loss  . Syncope 09/06/2017  . Type 2 diabetes mellitus ([place])    monitored by dr Dwyane Dee    Past Surgical History:  Procedure Laterality Date  . AORTIC VALVE REPLACEMENT  2009   [facility]  . BREAST LUMPECTOMY Right 01/18/2016  . BREAST LUMPECTOMY WITH RADIOACTIVE SEED AND SENTINEL LYMPH NODE BIOPSY Right 01/18/2016   Procedure: RIGHT BREAST LUMPECTOMY WITH RADIOACTIVE SEED AND SENTINEL LYMPH NODE BIOPSY;  Surgeon: DAlphonsa Overall MD;  [place]  Service: General;  Laterality: Right;  . CARDIAC CATHETERIZATION  03/23/2008   Pre-redo CABG: L main OK, LAD (T), CFX (T), OM1 99%, RCA (T), LIMA-LAD OK, SVG-OM(?3) OK w/ little florw to OM2, SVG-RCA OK. EF NL  . CARPAL TUNNEL RELEASE    . CHEST TUBE INSERTION Right 06/26/2019   Procedure: INSERTION PLEURAL DRAINAGE  CATHETER;  Surgeon: BGaye Pollack MD;  [place] [facility]  Service: Thoracic;  Laterality: Right;  . CHOLECYSTECTOMY N/A 07/07/2018   Procedure: LAPAROSCOPIC CHOLECYSTECTOMY WITH INTRAOPERATIVE CHOLANGIOGRAM ERAS PATHWAY;  Surgeon: NAlphonsa Overall MD;  Location: [facility];  Service: General;  Laterality: N/A;  . COLONOSCOPY     around 2015. Possibly with [facility]   . CORONARY ARTERY BYPASS GRAFT  1998 &  re-do 2009   Had LIMA to DX/LAD, SVG to 2 marginal branches [facility]; SVG to 3rd OM and PD at time of redo  . CYSTOSCOPY W/ URETERAL STENT PLACEMENT Right 12/20/2014   Procedure: CYSTOSCOPY WITH RETROGRADE PYELOGRAM/URETERAL STENT PLACEMENT;  Surgeon: PCleon Gustin MD;  [facility]  Service: Urology;  Laterality: Right;  . CYSTOSCOPY WITH RETROGRADE PYELOGRAM, URETEROSCOPY AND STENT PLACEMENT Left 08/21/2019   Procedure: CYSTOSCOPY WITH RETROGRADE PYELOGRAM, URETEROSCOPY AND STENT PLACEMENT;  Surgeon: MCleon Gustin MD;  [facility];  Service: Urology;  Laterality: Left;  1 HR  . ESOPHAGOGASTRODUODENOSCOPY     many years ago per patient   . ESOPHAGOGASTRODUODENOSCOPY ENDOSCOPY  06/17/2018  . EYE SURGERY Bilateral    cataracts  . HOLMIUM LASER APPLICATION Right 77/82/9562  Procedure:  HOLMIUM LASER LITHOTRIPSY;  Surgeon: Cleon Gustin, MD;  [facility];  Service: Urology;  Laterality: Right;  . HOLMIUM LASER APPLICATION Left 2/83/1517   Procedure: HOLMIUM LASER APPLICATION;  Surgeon: Cleon Gustin, MD;  [facility];  Service: Urology;  Laterality: Left;  . IR THORACENTESIS ASP PLEURAL SPACE W/IMG GUIDE  10/03/2018  . IR THORACENTESIS ASP PLEURAL SPACE W/IMG GUIDE  06/08/2019  . LEFT HEART CATHETERIZATION WITH CORONARY/GRAFT ANGIOGRAM N/A 02/23/2013   Procedure: LEFT HEART CATHETERIZATION WITH Beatrix Fetters;  Surgeon: Blane Ohara, MD;  [facility];  Service: Cardiovascular;   Laterality: N/A;  . LOOP RECORDER INSERTION N/A 08/30/2017   Procedure: LOOP RECORDER INSERTION;  Surgeon: Evans Lance, MD;  Location: [place] CV LAB;  Service: Cardiovascular;  Laterality: N/A;  . PORTACATH PLACEMENT Left 01/18/2016   Procedure: INSERTION PORT-A-CATH;  Surgeon: Alphonsa Overall, MD;  Location: [place];  Service: General;  Laterality: Left;  . portacath removal    . REMOVAL OF PLEURAL DRAINAGE CATHETER Right 07/14/2019   Procedure: REMOVAL OF PLEURAL DRAINAGE CATHETER;  Surgeon: Gaye Pollack, MD;  Location: [place];  Service: Thoracic;  Laterality: Right;  . TALC PLEURODESIS N/A 06/26/2019   Procedure: Pietro Cassis;  Surgeon: Gaye Pollack, MD;  Location[facility];  Service: Thoracic;  Laterality: N/A;  . TONSILLECTOMY    . TRANSTHORACIC ECHOCARDIOGRAM  02-24-2013      mild LVH,  ef 50-55%/  AV bioprosthesis was present with very mild stenosis and no regurg., mean grandient 5mHg, peak grandient 232mg /  mild MR/  mild LAE and RAE/  moderate TR  . TUBAL LIGATION      Family History  Problem Relation Age of Onset  . Heart disease Father   . Heart failure Father   . Diabetes Maternal Grandmother   . Heart disease Maternal Grandmother   . Diabetes Son   . Healthy Brother        #1  . Heart attack Brother        #2  . Heart disease Brother        #2  . Colon cancer Neg Hx   . Esophageal cancer Neg Hx     Social History:  reports that she has never smoked. She has never used smokeless tobacco. She reports that she does not drink alcohol or use drugs.    Review of Systems    Lipid history: On 40 mg Crestor along with Zetia      Lab Results  Component Value Date   CHOL 106 12/02/2018   HDL 34 (L) 12/02/2018   LDLCALC 53 12/02/2018   LDLDIRECT 53.0 10/08/2016   TRIG 97 12/02/2018   CHOLHDL 3.1 12/02/2018           Eyes:  No history of retinopathy  She is followed by nephrologist for renal dysfunction which shows variable results, apparently worse with  recent kidney stones   Lab Results  Component Value Date   CREATININE 2.07 (H) 09/22/2019   CREATININE 2.30 (H) 09/15/2019   CREATININE 2.75 (H) 09/09/2019    Diabetic foot exam in 3/20: She has significant loss of monofilament sensation in her distal feet She thinks her neuropathy started after her chemotherapy She has some difficulty with balance  BLOOD pressure: She has variable blood pressure readings  BP Readings from Last 3 Encounters:  10/14/19 (!) 110/34  10/14/19 (!) 144/42  10/08/19 (!) 126/48   She is  on amiodarone 200 mg, thyroid levels have been normal  Lab Results  Component Value Date   TSH 3.090 08/03/2019      Physical Examination:  BP (!) 110/34 (BP Location: Left Arm, Patient Position: Sitting, Cuff Size: Normal)   Pulse 63   Ht _0  (1.651 m)   Wt 149 lb (67.6 kg)   LMP  (LMP Unknown)   SpO2 96%   BMI 24.79 kg/m     ASSESSMENT:  Diabetes type 2, on insulin pump    See history of present illness for detailed discussion of  current management, blood sugar patterns and problems identified  Her A1c about 2 months ago was 6.1 and relatively better compared to January when it was 7.1  A1c is again likely falsely low Her average blood sugar at home recently is 172  She has not lost any further weight since her visit about 3 months ago  Blood sugars are fluctuating significantly although mostly higher after meals as discussed above Highest readings are midday and afternoon, she is taking Lantus in the morning Most likely she needs a higher dose of Humalog in the morning Currently she thinks she is better compliant with taking Humalog before meals but not adjusting it based on meal size and carbohydrate intake    Chronic amiodarone therapy: TSH normal    PLAN:    She will increase her Humalog to at least 8 units in the morning Discussed keeping postprandial readings under 180 She can go up or down at least 1 to 2 units at meals for  larger portions are more carbohydrate She will need to have consistent amount of protein with every meal We will keep her on the same dose of Lantus since occasionally her morning reading may be near normal  Trial of Trulicity again to help regulate her blood sugars after meals She was given a sample of 0.75 mg to take weekly and she will let us know if this is helping in the next 2 weeks She wants a prescription for this to her mail order company and will defer this until she calls [place] back and may need to possibly go up to 1.5 mg also unless she is having decreased appetite with this  Follow-up in 2 months with fructosamine, this will also help correlate with her blood sugar control better than A1c  Continue checking TSH every 6 months or so  She will follow up with her nephrologist for monitoring her renal function again  There are no Patient Instructions on file for this visit.       Elayne Snare 10/14/2019, 3:51 PM   Note: This office note was prepared with Dragon voice recognition system technology. Any transcriptional errors that result from this process are unintentional.

## 2019-10-14 NOTE — Patient Instructions (Signed)
Check blood sugars on waking up 7 days a week  Also check blood sugars about 2 hours after meals and do this after different meals by rotation  Recommended blood sugar levels on waking up are 90-130 and about 2 hours after meal is 130-180  Please bring your blood sugar monitor to each visit, thank you  Take 8 or more Humalog in am and keep lunch sugars <260  Weekly Trulicity  Call  For Rx

## 2019-10-15 ENCOUNTER — Ambulatory Visit: Payer: Medicare Other

## 2019-10-15 ENCOUNTER — Other Ambulatory Visit: Payer: Medicare Other

## 2019-10-15 ENCOUNTER — Inpatient Hospital Stay: Payer: Medicare Other

## 2019-10-15 ENCOUNTER — Other Ambulatory Visit: Payer: Self-pay

## 2019-10-15 ENCOUNTER — Inpatient Hospital Stay: Payer: Medicare Other | Attending: Hematology and Oncology

## 2019-10-15 VITALS — BP 118/64 | HR 66

## 2019-10-15 DIAGNOSIS — Z170 Estrogen receptor positive status [ER+]: Secondary | ICD-10-CM | POA: Diagnosis not present

## 2019-10-15 DIAGNOSIS — I129 Hypertensive chronic kidney disease with stage 1 through stage 4 chronic kidney disease, or unspecified chronic kidney disease: Secondary | ICD-10-CM | POA: Insufficient documentation

## 2019-10-15 DIAGNOSIS — Z7901 Long term (current) use of anticoagulants: Secondary | ICD-10-CM | POA: Diagnosis not present

## 2019-10-15 DIAGNOSIS — D631 Anemia in chronic kidney disease: Secondary | ICD-10-CM | POA: Diagnosis not present

## 2019-10-15 DIAGNOSIS — D638 Anemia in other chronic diseases classified elsewhere: Secondary | ICD-10-CM

## 2019-10-15 DIAGNOSIS — Z9221 Personal history of antineoplastic chemotherapy: Secondary | ICD-10-CM | POA: Diagnosis not present

## 2019-10-15 DIAGNOSIS — N200 Calculus of kidney: Secondary | ICD-10-CM | POA: Diagnosis not present

## 2019-10-15 DIAGNOSIS — Z79811 Long term (current) use of aromatase inhibitors: Secondary | ICD-10-CM | POA: Insufficient documentation

## 2019-10-15 DIAGNOSIS — Z923 Personal history of irradiation: Secondary | ICD-10-CM | POA: Diagnosis not present

## 2019-10-15 DIAGNOSIS — N189 Chronic kidney disease, unspecified: Secondary | ICD-10-CM | POA: Insufficient documentation

## 2019-10-15 DIAGNOSIS — C50211 Malignant neoplasm of upper-inner quadrant of right female breast: Secondary | ICD-10-CM | POA: Diagnosis not present

## 2019-10-15 DIAGNOSIS — J90 Pleural effusion, not elsewhere classified: Secondary | ICD-10-CM | POA: Diagnosis not present

## 2019-10-15 DIAGNOSIS — I509 Heart failure, unspecified: Secondary | ICD-10-CM | POA: Insufficient documentation

## 2019-10-15 DIAGNOSIS — Z79899 Other long term (current) drug therapy: Secondary | ICD-10-CM | POA: Diagnosis not present

## 2019-10-15 LAB — CBC WITH DIFFERENTIAL (CANCER CENTER ONLY)
Abs Immature Granulocytes: 0.02 10*3/uL (ref 0.00–0.07)
Basophils Absolute: 0 10*3/uL (ref 0.0–0.1)
Basophils Relative: 1 %
Eosinophils Absolute: 0.3 10*3/uL (ref 0.0–0.5)
Eosinophils Relative: 5 %
HCT: 29.9 % — ABNORMAL LOW (ref 36.0–46.0)
Hemoglobin: 8.3 g/dL — ABNORMAL LOW (ref 12.0–15.0)
Immature Granulocytes: 0 %
Lymphocytes Relative: 11 %
Lymphs Abs: 0.7 10*3/uL (ref 0.7–4.0)
MCH: 25.9 pg — ABNORMAL LOW (ref 26.0–34.0)
MCHC: 27.8 g/dL — ABNORMAL LOW (ref 30.0–36.0)
MCV: 93.4 fL (ref 80.0–100.0)
Monocytes Absolute: 0.7 10*3/uL (ref 0.1–1.0)
Monocytes Relative: 12 %
Neutro Abs: 4.2 10*3/uL (ref 1.7–7.7)
Neutrophils Relative %: 71 %
Platelet Count: 138 10*3/uL — ABNORMAL LOW (ref 150–400)
RBC: 3.2 MIL/uL — ABNORMAL LOW (ref 3.87–5.11)
RDW: 15.4 % (ref 11.5–15.5)
WBC Count: 6 10*3/uL (ref 4.0–10.5)
nRBC: 0 % (ref 0.0–0.2)

## 2019-10-15 LAB — SAMPLE TO BLOOD BANK

## 2019-10-15 MED ORDER — EPOETIN ALFA-EPBX 40000 UNIT/ML IJ SOLN
40000.0000 [IU] | Freq: Once | INTRAMUSCULAR | Status: AC
  Administered 2019-10-15: 40000 [IU] via SUBCUTANEOUS

## 2019-10-15 MED ORDER — EPOETIN ALFA-EPBX 40000 UNIT/ML IJ SOLN
INTRAMUSCULAR | Status: AC
  Filled 2019-10-15: qty 1

## 2019-10-16 ENCOUNTER — Telehealth: Payer: Self-pay | Admitting: Hematology and Oncology

## 2019-10-16 ENCOUNTER — Other Ambulatory Visit: Payer: Self-pay | Admitting: Endocrinology

## 2019-10-16 NOTE — Telephone Encounter (Signed)
Rescheduled appt per 5/6 sch msg. Pt request Thursday afternoon.

## 2019-10-22 ENCOUNTER — Inpatient Hospital Stay: Payer: Medicare Other

## 2019-10-22 ENCOUNTER — Other Ambulatory Visit: Payer: Self-pay | Admitting: *Deleted

## 2019-10-22 ENCOUNTER — Ambulatory Visit: Payer: Medicare Other

## 2019-10-22 ENCOUNTER — Other Ambulatory Visit: Payer: Self-pay | Admitting: Hematology and Oncology

## 2019-10-22 ENCOUNTER — Other Ambulatory Visit: Payer: Self-pay

## 2019-10-22 ENCOUNTER — Other Ambulatory Visit: Payer: Medicare Other

## 2019-10-22 VITALS — BP 108/47 | HR 63 | Temp 98.6°F | Resp 18

## 2019-10-22 DIAGNOSIS — D638 Anemia in other chronic diseases classified elsewhere: Secondary | ICD-10-CM

## 2019-10-22 DIAGNOSIS — I129 Hypertensive chronic kidney disease with stage 1 through stage 4 chronic kidney disease, or unspecified chronic kidney disease: Secondary | ICD-10-CM | POA: Diagnosis not present

## 2019-10-22 LAB — CBC WITH DIFFERENTIAL (CANCER CENTER ONLY)
Abs Immature Granulocytes: 0.01 10*3/uL (ref 0.00–0.07)
Basophils Absolute: 0 10*3/uL (ref 0.0–0.1)
Basophils Relative: 1 %
Eosinophils Absolute: 0.2 10*3/uL (ref 0.0–0.5)
Eosinophils Relative: 3 %
HCT: 26 % — ABNORMAL LOW (ref 36.0–46.0)
Hemoglobin: 7.4 g/dL — ABNORMAL LOW (ref 12.0–15.0)
Immature Granulocytes: 0 %
Lymphocytes Relative: 12 %
Lymphs Abs: 0.6 10*3/uL — ABNORMAL LOW (ref 0.7–4.0)
MCH: 26.3 pg (ref 26.0–34.0)
MCHC: 28.5 g/dL — ABNORMAL LOW (ref 30.0–36.0)
MCV: 92.5 fL (ref 80.0–100.0)
Monocytes Absolute: 0.6 10*3/uL (ref 0.1–1.0)
Monocytes Relative: 11 %
Neutro Abs: 3.9 10*3/uL (ref 1.7–7.7)
Neutrophils Relative %: 73 %
Platelet Count: 128 10*3/uL — ABNORMAL LOW (ref 150–400)
RBC: 2.81 MIL/uL — ABNORMAL LOW (ref 3.87–5.11)
RDW: 15.9 % — ABNORMAL HIGH (ref 11.5–15.5)
WBC Count: 5.4 10*3/uL (ref 4.0–10.5)
nRBC: 0 % (ref 0.0–0.2)

## 2019-10-22 LAB — PREPARE RBC (CROSSMATCH)

## 2019-10-22 LAB — SAMPLE TO BLOOD BANK

## 2019-10-22 MED ORDER — EPOETIN ALFA-EPBX 40000 UNIT/ML IJ SOLN
40000.0000 [IU] | Freq: Once | INTRAMUSCULAR | Status: AC
  Administered 2019-10-22: 40000 [IU] via SUBCUTANEOUS

## 2019-10-22 MED ORDER — EPOETIN ALFA-EPBX 40000 UNIT/ML IJ SOLN
INTRAMUSCULAR | Status: AC
  Filled 2019-10-22: qty 1

## 2019-10-22 NOTE — Patient Instructions (Signed)

## 2019-10-22 NOTE — Progress Notes (Signed)
Pt Hgb 7.4.  Per MD pt to receive 1 unit PRBC.  Orders placed and verified with blood bank.  Scheduling message sent to schedule pt.

## 2019-10-22 NOTE — Progress Notes (Signed)
Dr.Gudena notified about. Pt Hgb-7.4. She received her injection today per Dr.Gudena. She will come back to receive blood transfusion. Merleen Nicely RN will call her to let her know what time to come back Friday or Saturday.

## 2019-10-23 IMAGING — DX DG CHEST 2V
2 series · 2 of 2 positions shown · non-contrast
Comparison: 09/06/2017.

CLINICAL DATA: Chest pain.  Tightness.

EXAM:
CHEST - 2 VIEW

[chest pa]
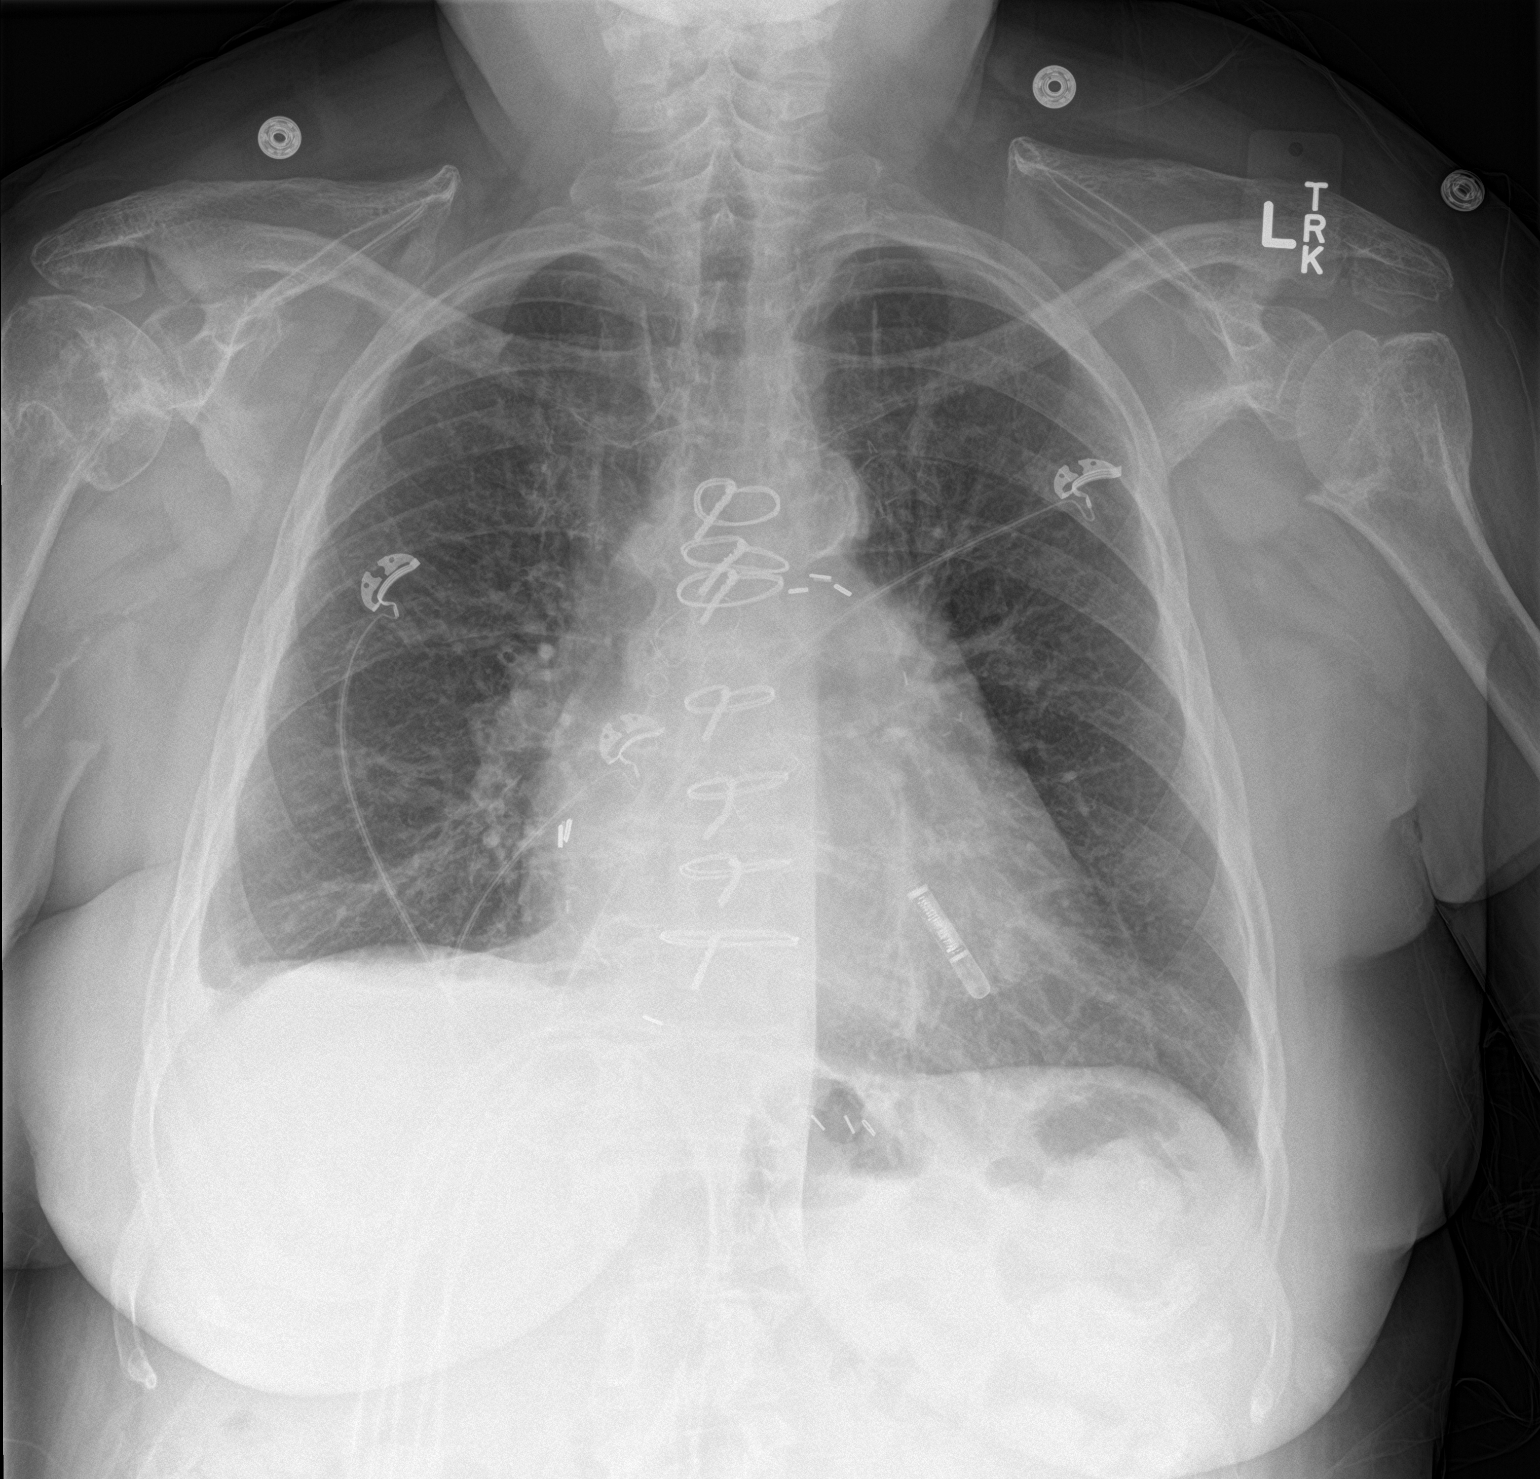

[chest lat]
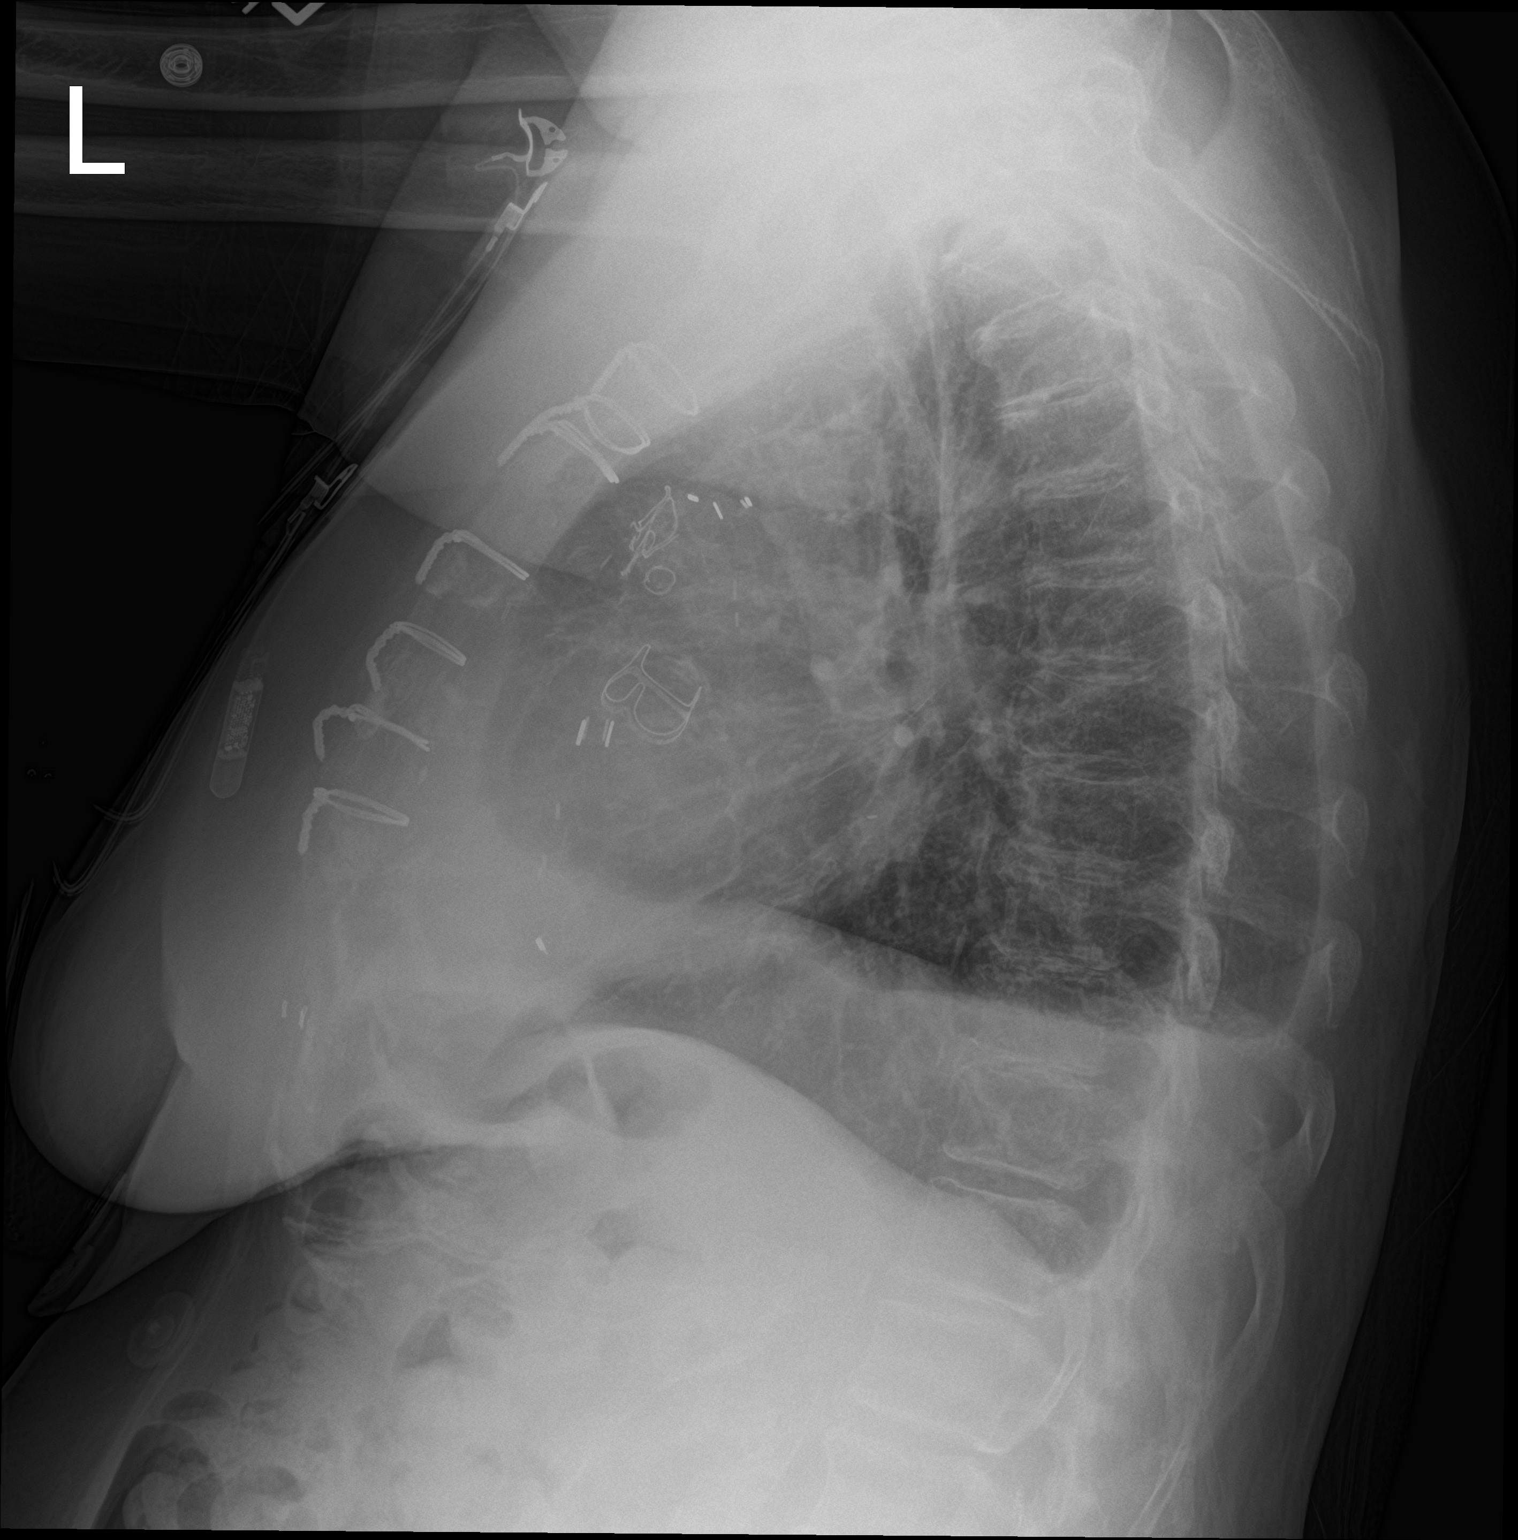

[2 of 2 positions shown; findings below may reference images not displayed]

FINDINGS: Prior CABG. Prior cardiac valve replacement. Cardiac monitor device
noted. Cardiomegaly with very mild bilateral interstitial prominence
and tiny pleural effusions degenerative and posttraumatic changes
noted both shoulders. These are stable.
IMPRESSION: Prior CABG. Prior cardiac valve replacement. Cardiac monitor noted.
Cardiomegaly with very mild bilateral interstitial prominence and
tiny bilateral pleural effusions. Very mild CHF may be present.

## 2019-10-24 ENCOUNTER — Inpatient Hospital Stay: Payer: Medicare Other

## 2019-10-24 ENCOUNTER — Other Ambulatory Visit: Payer: Self-pay

## 2019-10-24 DIAGNOSIS — I129 Hypertensive chronic kidney disease with stage 1 through stage 4 chronic kidney disease, or unspecified chronic kidney disease: Secondary | ICD-10-CM | POA: Diagnosis not present

## 2019-10-24 DIAGNOSIS — D638 Anemia in other chronic diseases classified elsewhere: Secondary | ICD-10-CM

## 2019-10-24 MED ORDER — DIPHENHYDRAMINE HCL 25 MG PO CAPS
25.0000 mg | ORAL_CAPSULE | Freq: Once | ORAL | Status: AC
  Administered 2019-10-24: 25 mg via ORAL

## 2019-10-24 MED ORDER — SODIUM CHLORIDE 0.9% IV SOLUTION
250.0000 mL | Freq: Once | INTRAVENOUS | Status: AC
  Administered 2019-10-24: 250 mL via INTRAVENOUS
  Filled 2019-10-24: qty 250

## 2019-10-24 MED ORDER — DIPHENHYDRAMINE HCL 25 MG PO CAPS
ORAL_CAPSULE | ORAL | Status: AC
  Filled 2019-10-24: qty 1

## 2019-10-24 MED ORDER — ACETAMINOPHEN 325 MG PO TABS
650.0000 mg | ORAL_TABLET | Freq: Once | ORAL | Status: AC
  Administered 2019-10-24: 650 mg via ORAL

## 2019-10-24 MED ORDER — ACETAMINOPHEN 325 MG PO TABS
ORAL_TABLET | ORAL | Status: AC
  Filled 2019-10-24: qty 2

## 2019-10-24 NOTE — Patient Instructions (Signed)

## 2019-10-25 LAB — TYPE AND SCREEN
ABO/RH(D): O POS
Antibody Screen: NEGATIVE
Unit division: 0

## 2019-10-25 LAB — BPAM RBC
Blood Product Expiration Date: 202106122359
ISSUE DATE / TIME: 202105151026
Unit Type and Rh: 5100

## 2019-10-26 ENCOUNTER — Ambulatory Visit (INDEPENDENT_AMBULATORY_CARE_PROVIDER_SITE_OTHER): Payer: Medicare Other | Admitting: *Deleted

## 2019-10-26 DIAGNOSIS — R55 Syncope and collapse: Secondary | ICD-10-CM

## 2019-10-26 LAB — CUP PACEART REMOTE DEVICE CHECK
Date Time Interrogation Session: 20210516124237
Implantable Pulse Generator Implant Date: 20190322

## 2019-10-26 IMAGING — MG DIGITAL DIAGNOSTIC BILATERAL MAMMOGRAM WITH TOMO AND CAD
6 of 11 series · 6 of 31 positions shown · non-contrast
Comparison: Previous exam(s).

CLINICAL DATA: Annual examination. History right breast cancer,
status post lumpectomy and January 2016.

EXAM:
DIGITAL DIAGNOSTIC BILATERAL MAMMOGRAM WITH CAD AND TOMO

[R CC]
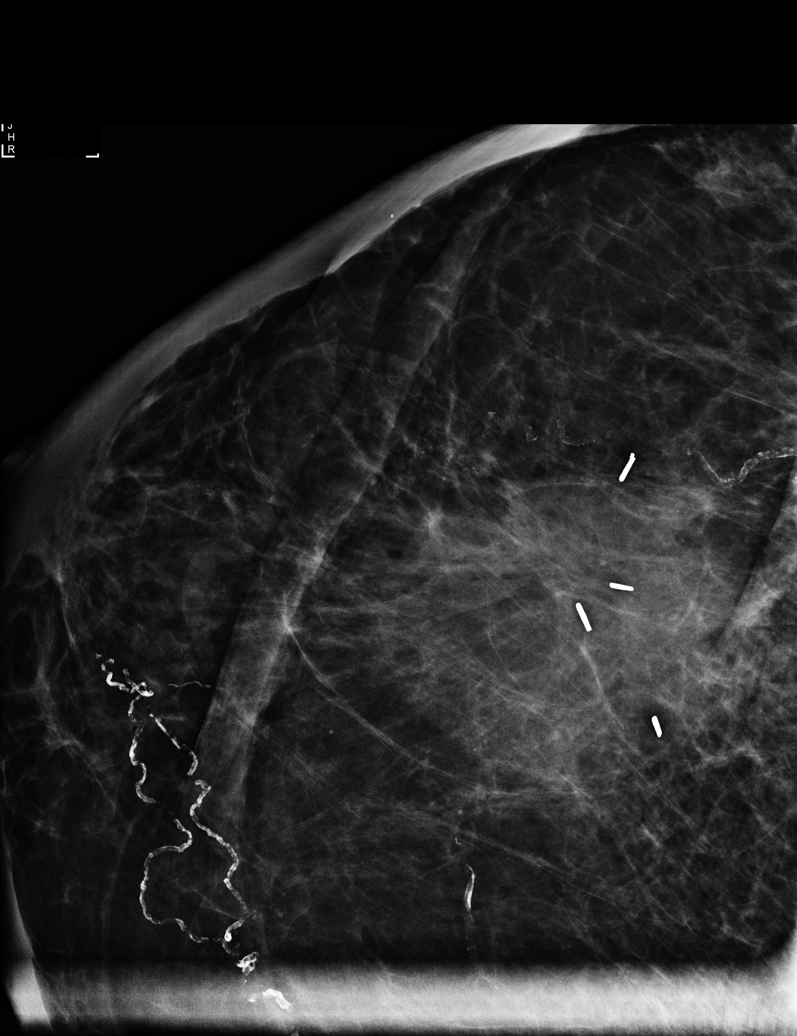

[L CC synth-2D]
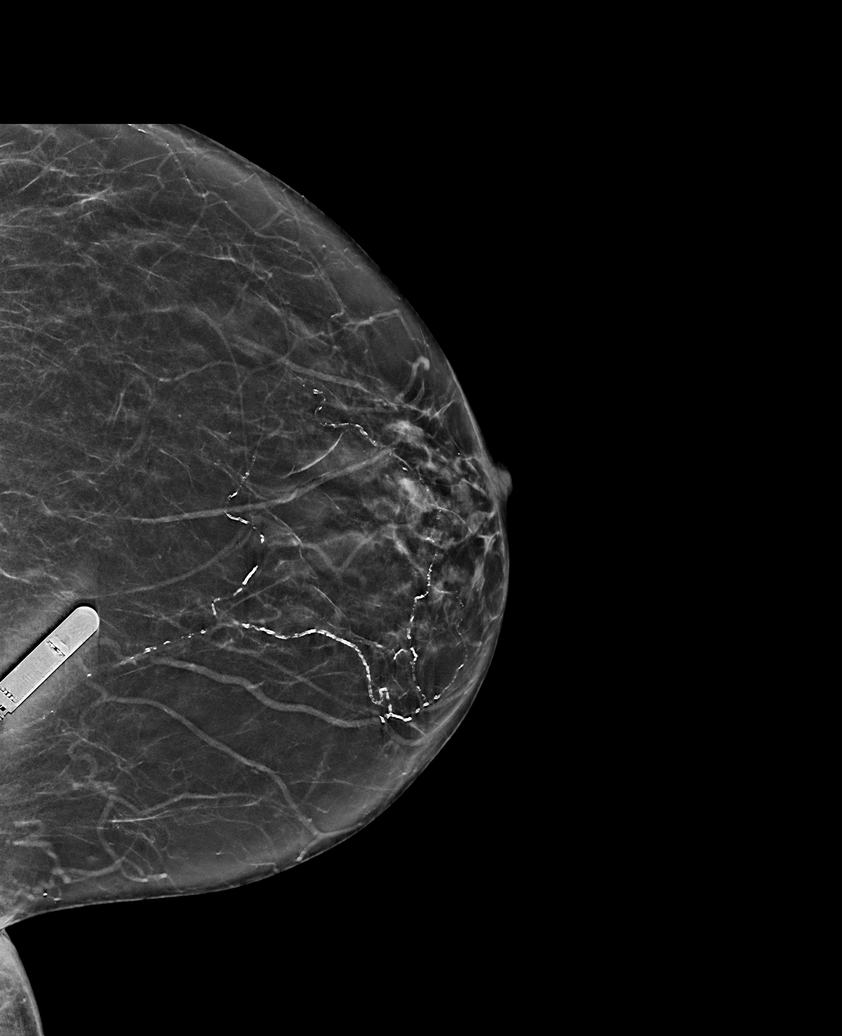

[L MLO synth-2D (1 of 2)]
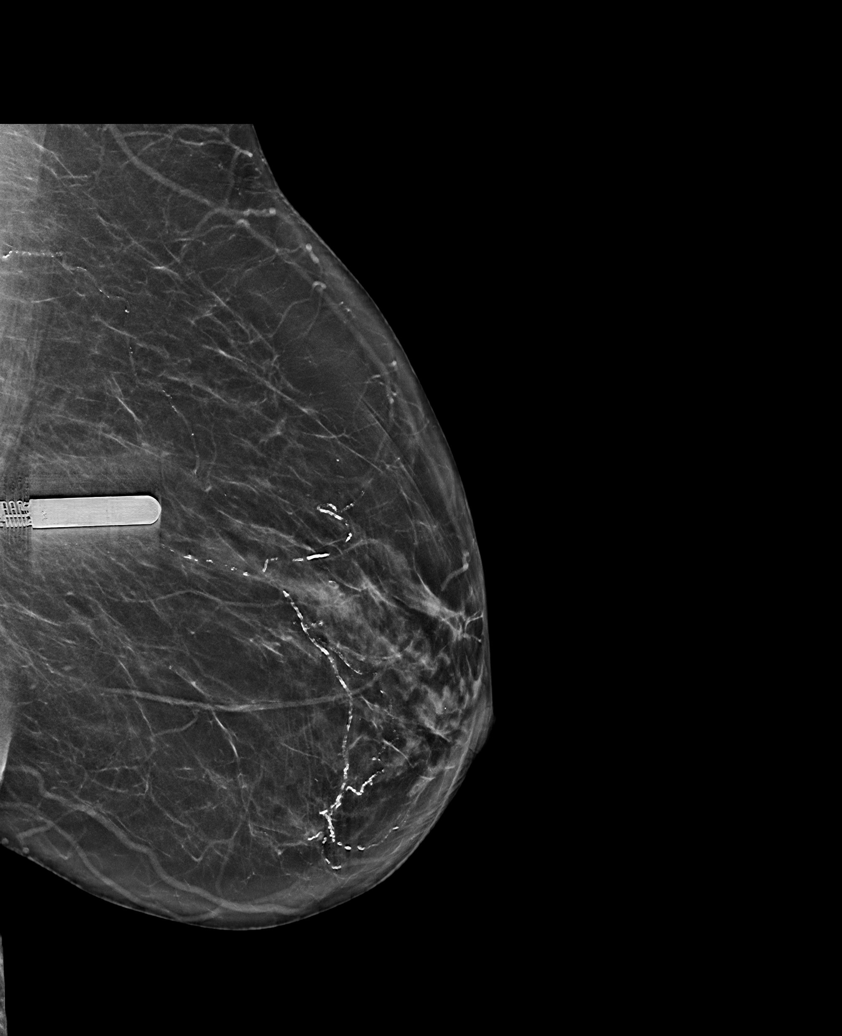

[R MLO synth-2D]
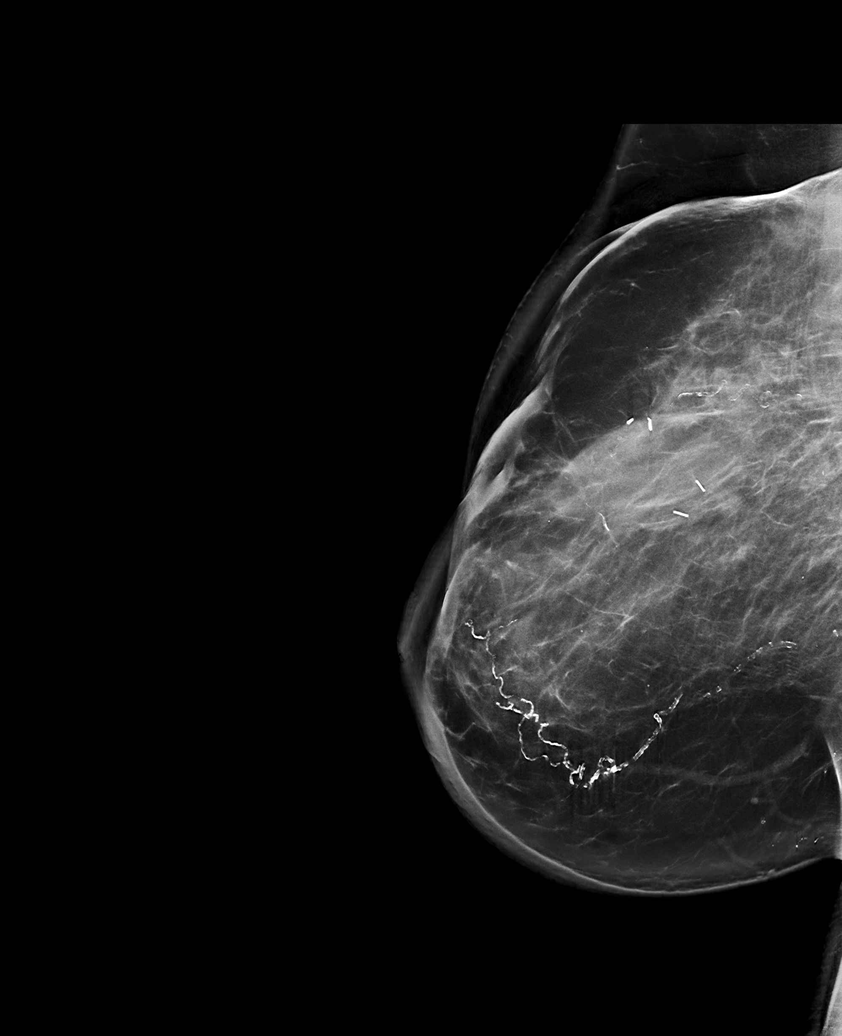

[R CC synth-2D]
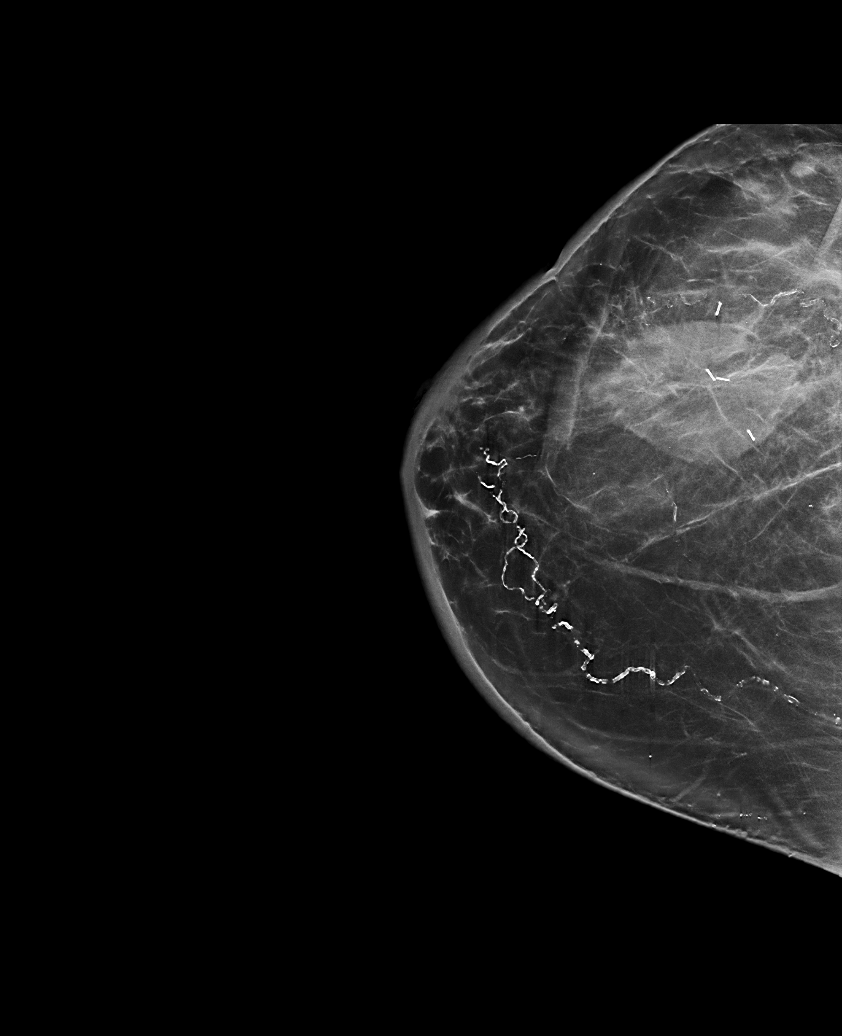

[L MLO synth-2D (2 of 2)]
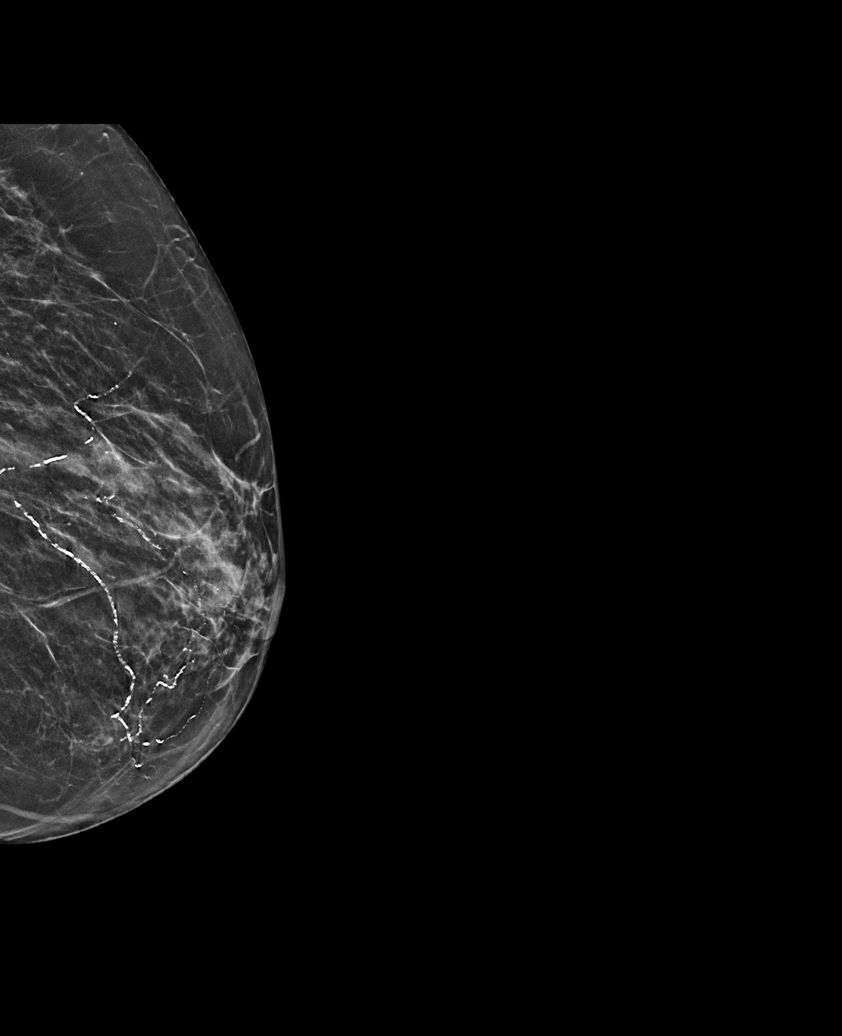

[6 of 31 positions shown; findings below may reference images not displayed]

ACR Breast Density Category b: There are scattered areas of
fibroglandular density.
FINDINGS: Due to breast tenderness, the patient would not allow the
technologist to apply the typical mount of compression.

Interval decrease in size of postoperative seroma at the lumpectomy
site in the outer right breast. Skin thickening of the right breast
compatible with radiation change. Loop recorder projects over the
upper inner quadrant of the left breast. Heavy atherosclerotic vast
classifications bilaterally.

No suspicious mass, nonsurgical distortion, or suspicious
microcalcification in either breast.

Mammographic images were processed with CAD.
IMPRESSION: No evidence of malignancy bilaterally. Lumpectomy changes on the
right.

RECOMMENDATION:
Diagnostic mammogram is suggested in 1 year. (Code:[id number]-[id number])

I have discussed the findings and recommendations with the patient.
Results were also provided in writing at the conclusion of the
visit. If applicable, a reminder letter will be sent to the patient
regarding the next appointment.

BI-RADS CATEGORY  2: Benign.

## 2019-10-27 ENCOUNTER — Telehealth: Payer: Self-pay

## 2019-10-27 NOTE — Telephone Encounter (Signed)
She started Trulicity on 5/5.  She has taken this in the past without any diarrhea side effects.  Unlikely that this is medication related since side effects occur right away and are mostly nausea.  would like her to wait till she is better to try it again.

## 2019-10-27 NOTE — Telephone Encounter (Signed)
Patient notified of recommendations.  Patient states that you had given her samples of the medication and states that she would need a new RX of it sent into [facility].  She has taken her last one that she had.

## 2019-10-27 NOTE — Telephone Encounter (Signed)
Please send prescription for Trulicity 1.5 mg weekly, okay to give 90-day prescription

## 2019-10-27 NOTE — Telephone Encounter (Signed)
Patient called today and she is reporting diarrhea and a stomach ache for the past two days since starting Trulicity and she does not want to take it any longer-she wants to know if she should continue and if so she would use [facility] as the pharmacy

## 2019-10-27 NOTE — Progress Notes (Signed)
[facility] Summary Report / Loop Recorder 

## 2019-10-28 ENCOUNTER — Other Ambulatory Visit: Payer: Self-pay

## 2019-10-28 DIAGNOSIS — Z794 Long term (current) use of insulin: Secondary | ICD-10-CM

## 2019-10-28 DIAGNOSIS — E1165 Type 2 diabetes mellitus with hyperglycemia: Secondary | ICD-10-CM

## 2019-10-28 MED ORDER — TRULICITY 1.5 MG/0.5ML ~~LOC~~ SOAJ: 1.5000 mg | SUBCUTANEOUS | 1 refills | Status: DC

## 2019-10-28 NOTE — Telephone Encounter (Signed)
Patient notified that RX for Trulicity 1.5 mg weekly has been sent into [facility] per Dr Dwyane Dee.

## 2019-10-28 NOTE — Progress Notes (Signed)
CARDIOLOGY OFFICE NOTE  Date:  11/04/2019    Hannah [name] Date of Birth: 1946-05-27 Medical Record #[id number]  PCP:  [name], DO  Cardiologist:  Atilano Median    Chief Complaint  Patient presents with  . Follow-up    History of Present Illness: Hannah [name] is a 74 y.o. female who presents today for a follow up visit. Seen for Dr. Lovena Le.Primarily follows with me.  She has a very complex/extensivehistory - this includes ahistory ofCAD s/p CABG (1998 with redo 2009- Dr. Cyndia Bent), bioprosthetic AVR in 0923, chronic diastolic CHF, asthma, carotid artery disease (1-39% by duplex 10/2017, due 2021), CKD stage IV(Dr. Posey Pronto), PAF- has been on Multaq in the past and now on amiodarone, atrial tachycardia, breast CA, orthostasis,chronicanemia, thrombocytopenia, mild dilation of ascending aorta by echo 10/2018, recurrent syncope/orthostasis,recurrentrightpleural effusion requiring thoracentesis4/2020and 05/2019, HTN, GERD,&HLD.   I have followed her for many years - she has had periodic issues with volume overload, orthostasis and tachy palpitations.Last cath in 2014 - grafts patent. Myoview in 2015 without ischemia.  Over the past few months she has had more issues with her pleural effusion - diuretics caused worsening CKD/orthostasis- resulting in injury. She has gotten more anemic(down to 6)- back to hematology and now on therapy. Concern for MDS andhas hadbone marrow biopsy - wasalso needing colonoscopy due to profound anemia (HGB down to 6 which is highly unusual for [place]).Most recent abdominal ultrasound noted and concerning for liver abnormality. Recent mammogram also recently noted and abnormal - ended up havingmultiplerepeat biopsies that were negative.  Most recent course has beenprettyeventful - she has hadto be placed back onlow dose diuretics forarecurring effusion.Pulmonary wished for me to dose her diuretics due to her fragile  nature with orthostasis/CKD.Progressive failure to thrive. Has been back to the hospital in the interim- treated for CHF and pneumonia. Remainsmarkedlyanemic - needed colonoscopy. Has had abnormal mammogram with 4subsequent negativebiopsies. At one visit in September - she was coming in here and got dizzy/fell in the parking loton the curband had significant laceration to the back of her her head. ILR interrogation was ok at that time. Have added back Midodrine for orthostasisto prevent injury.She has been slow to recoverand make progress. Has seen nephrology who thought her kidney function was stable. Ended up having CT in place of colonoscopy per GI.  She has had recurrent effusion - referred to Dr. Cyndia Bent and she had Pleurex cath placement with talcand subsequent removal with improvement. Cytologies on prior taps negative. Wegot her echo updated as well - EF remains normal.I have followed her closely - she had a kidney stone that ended up causing worsening CKD - had lithotripsy - had a foley for a short time and had worsening anemia and required transfusion again.   Last seen earlier this month - having more swelling. Early satiety. Couldn't get weight to stay down - Lasix was increased - has since been found to have more anemia. Got down again to 7.4 - was transfused.   The patient does not have symptoms concerning for COVID-19 infection (fever, chills, cough, or new shortness of breath).   Comes in today. Here with [name]. She continues to not feel all that great. Less chest pain but it is still present. Remains short of breath. Still feels like she is volume overloaded. Swelling persists. Weight is creeping on up - now at 150 - we have tried to keep her in the low 140s. Not dizzy. Not orthostatic - still on  Midodrine. Seeing Dr. Lindi Adie tomorrow. She was transfused - one unit. Had recurrent kidney stone - ended up in the ER 09/06/2022 night - she has passed it - labs were reviewed. This  now seems to be better - she is going to have GU follow up.   Past Medical History:  Diagnosis Date  . Abnormally small mouth   . Allergic rhinitis 10/14/2009   Qualifier: Diagnosis of  By: Lamonte Sakai MD, Rose Fillers   Overview:  Overview:  Qualifier: Diagnosis of  By: Lamonte Sakai MD, Rose Fillers  Last Assessment & Plan:  Please continue Xyzal and Nasacort as you have been using them  . Anemia   . Asthma 05/12/2009   10/12/2014 p extensive coaching HFA effectiveness =    75% s spacer    Overview:  Overview:  10/12/2014 p extensive coaching HFA effectiveness =    75% s spacer   Last Assessment & Plan:  Please continue Symbicort 2 puffs twice a day. Remember to rinse and gargle after taking this medication. Take albuterol 2 puffs up to every 4 hours if needed for shortness of breath.  Follow with Dr Lamonte Sakai in 6 month  . Bilateral carotid artery stenosis    a. mild 1-39% by duplex 10/2017, due 2021.  . Breast cancer ([place]) 01/18/2016   right breast  . CAD (coronary artery disease) 01/24/2011   a. s/p CABG 1998 with redo 2009.  Marland Kitchen Chemotherapy induced nausea and vomiting 02/24/2016  . Chemotherapy-induced peripheral neuropathy ([place]) 04/27/2016  . 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 Chronic diastolic CHF (congestive heart failure) ([place]) 09/24/2013  . CKD (chronic kidney disease), stage IV ([place]) 09/24/2013  . Closed fracture of head of left humerus 09/09/2017  . Complication of anesthesia   . Dysrhythmia    a-fib  . Gallstones   . GERD (gastroesophageal reflux disease)   . Glaucoma   . H/O atrial tachycardia 05/12/2009   Qualifier: History of  By: Lamonte Sakai MD, Rose Fillers   Overview:  Overview:  Qualifier: History of  By: Lamonte Sakai MD, Rose Fillers  Last Assessment & Plan:  There was no evidence of this on her cardiac monitor. I would recommend watchful waiting.   Marland Kitchen Heart murmur   . History of kidney stones   . History of non-ST elevation myocardial infarction (NSTEMI)    Sept 2014--  thought to be type II HTN w/ LHC  without infarct related artery and patent grafts  . History of radiation therapy 05/24/16-07/26/16   right breast 50.4 Gy in 28 fractions, right breast boost 10 Gy in 5 fractions  . Hyperlipidemia   . Hypotension   . Iron deficiency anemia   . Mania ([place]) 03/05/2016  . Moderate persistent asthma    pulmologist-  Dr. Malvin Johns  . Osteomyelitis of toe of left foot ([place]) 04/06/2016  . Osteopenia of multiple sites 10/19/2015  . PAF (paroxysmal atrial fibrillation) ([place])   . Personal history of chemotherapy 2017  . Personal history of radiation therapy 2017  . PONV (postoperative nausea and vomiting)   . Port catheter in place 02/17/2016  . Psoriasis    right leg  . Renal calculus, right   . S/P AVR    prosthesis valve placement 2009 at same time re-do CABG  . Seizure-like activity ([place]) 09/01/2017  . Sensorineural hearing loss (SNHL) of both ears 01/06/2016  . Stroke [facility]) 2014   residual rt hearing loss  . Syncope 09/06/2017  . Type 2 diabetes mellitus ([place])    monitored  by dr Dwyane Dee    Past Surgical History:  Procedure Laterality Date  . AORTIC VALVE REPLACEMENT  2009   [facility] (done same time is CABG)  . BREAST LUMPECTOMY Right 01/18/2016  . BREAST LUMPECTOMY WITH RADIOACTIVE SEED AND SENTINEL LYMPH NODE BIOPSY Right 01/18/2016   Procedure: RIGHT BREAST LUMPECTOMY WITH RADIOACTIVE SEED AND SENTINEL LYMPH NODE BIOPSY;  Surgeon: DAlphonsa Overall MD;  [facility]  Service: General;  Laterality: Right;  . CARDIAC CATHETERIZATION  03/23/2008   Pre-redo CABG: L main OK, LAD (T), CFX (T), OM1 99%, RCA (T), LIMA-LAD OK, SVG-OM(?3) OK w/ little florw to OM2, SVG-RCA OK. EF NL  . CARPAL TUNNEL RELEASE    . CHEST TUBE INSERTION Right 06/26/2019   Procedure: INSERTION PLEURAL DRAINAGE CATHETER;  Surgeon: BGaye Pollack MD;  [facility]  Service: Thoracic;  Laterality: Right;  . CHOLECYSTECTOMY N/A 07/07/2018   Procedure: LAPAROSCOPIC CHOLECYSTECTOMY WITH  INTRAOPERATIVE CHOLANGIOGRAM ERAS PATHWAY;  Surgeon: NAlphonsa Overall MD;  [facility];  Service: General;  Laterality: N/A;  . COLONOSCOPY     around 2015. Possibly with [facility]   . CORONARY ARTERY BYPASS GRAFT  1998 &  re-do 2009   Had LIMA to DX/LAD, SVG to 2 marginal branches and [facility]; SVG to 3rd OM and PD at time of redo  . CYSTOSCOPY W/ URETERAL STENT PLACEMENT Right 12/20/2014   Procedure: CYSTOSCOPY WITH RETROGRADE PYELOGRAM/URETERAL STENT PLACEMENT;  Surgeon: PCleon Gustin MD;  [facility]  Service: Urology;  Laterality: Right;  . CYSTOSCOPY WITH RETROGRADE PYELOGRAM, URETEROSCOPY AND STENT PLACEMENT Left 08/21/2019   Procedure: CYSTOSCOPY WITH RETROGRADE PYELOGRAM, URETEROSCOPY AND STENT PLACEMENT;  Surgeon: MCleon Gustin MD;  [facility];  Service: Urology;  Laterality: Left;  1 HR  . ESOPHAGOGASTRODUODENOSCOPY     many years ago per patient   . ESOPHAGOGASTRODUODENOSCOPY ENDOSCOPY  06/17/2018  . EYE SURGERY Bilateral    cataracts  . HOLMIUM LASER APPLICATION Right 77/54/4920  Procedure:  HOLMIUM LASER LITHOTRIPSY;  Surgeon: PCleon Gustin MD;  [facility]  Service: Urology;  Laterality: Right;  . HOLMIUM LASER APPLICATION Left 31/00/7121  Procedure: HOLMIUM LASER APPLICATION;  Surgeon: MCleon Gustin MD;  [facility];  Service: Urology;  Laterality: Left;  . IR THORACENTESIS ASP PLEURAL SPACE W/IMG GUIDE  10/03/2018  . IR THORACENTESIS ASP PLEURAL SPACE W/IMG GUIDE  06/08/2019  . LEFT HEART CATHETERIZATION WITH CORONARY/GRAFT ANGIOGRAM N/A 02/23/2013   Procedure: LEFT HEART CATHETERIZATION WITH CBeatrix Fetters  Surgeon: MBlane Ohara MD;  [facility];  Service: Cardiovascular;  Laterality: N/A;  . LOOP RECORDER INSERTION N/A 08/30/2017   Procedure: LOOP RECORDER INSERTION;  Surgeon: TEvans Lance MD;  [facility];  Service: Cardiovascular;   Laterality: N/A;  . PORTACATH PLACEMENT Left 01/18/2016   Procedure: INSERTION PORT-A-CATH;  Surgeon: DAlphonsa Overall MD;  [facility]  Service: General;  Laterality: Left;  . portacath removal    . REMOVAL OF PLEURAL DRAINAGE CATHETER Right 07/14/2019   Procedure: REMOVAL OF PLEURAL DRAINAGE CATHETER;  Surgeon: BGaye Pollack MD;  [place] [place]  Service: Thoracic;  Laterality: Right;  . TALC PLEURODESIS N/A 06/26/2019   Procedure: TPietro Cassis  Surgeon: BGaye Pollack MD;  [facility];  Service: Thoracic;  Laterality: N/A;  . TONSILLECTOMY    . TRANSTHORACIC ECHOCARDIOGRAM  02-24-2013  mild LVH,  ef 50-55%/  AV bioprosthesis was present with very mild stenosis and no regurg., mean grandient 40mHg, peak grandient 215mg /  mild MR/  mild LAE and RAE/  moderate TR  . TUBAL LIGATION       Medications: Current Meds  Medication Sig  . ACCU-CHEK GUIDE test strip Use to test blood sugar 3 times daily  . acetaminophen (TYLENOL) 325 MG tablet Take 2 tablets (650 mg total) by mouth every 6 (six) hours as needed for mild pain (or Fever >/= 101).  . Marland Kitchenlbuterol (VENTOLIN HFA) 108 (90 Base) MCG/ACT inhaler Inhale 2 puffs into the lungs every 6 (six) hours as needed for wheezing or shortness of breath.  . Marland Kitchenmiodarone (PACERONE) 200 MG tablet Take 1 tablet (200 mg total) by mouth daily.  . Blood Glucose Monitoring Suppl (ACCU-CHEK GUIDE ME) w/Device KIT Use accu chek meter to check blood sugar three times daily.  . budesonide-formoterol (SYMBICORT) 160-4.5 MCG/ACT inhaler Inhale 2 puffs into the lungs 2 (two) times daily.  . Dulaglutide (TRULICITY) 1.5 MGIP/3.8SNOPN Inject 1.5 mg into the skin once a week.  . Marland KitchenLIQUIS 5 MG TABS tablet Take 1 tablet (5 mg total) by mouth 2 (two) times daily.  . Marland Kitchenpoetin alfa-epbx (RETACRIT) 4005397NIT/ML injection Inject 40,000 Units into the skin every Thursday.   . Marland Kitchenxemestane (AROMASIN) 25 MG tablet TAKE ONE TABLET BY MOUTH DAILY AFTER BREAKFAST  .  ezetimibe (ZETIA) 10 MG tablet TAKE ONE TABLET BY MOUTH EVERY DAY  . famotidine (PEPCID) 20 MG tablet Take 20 mg by mouth 2 (two) times daily.  . furosemide (LASIX) 20 MG tablet Take 20 mg by mouth daily as needed.  . Marland KitchenYDROcodone-acetaminophen (NORCO/VICODIN) 5-325 MG tablet Take 1-2 tablets by mouth every 6 (six) hours as needed.  . insulin glargine (LANTUS) 100 UNIT/ML injection Inject 18 Units into the skin daily.  . Insulin Lispro (HUMALOG KWIKPEN [place]) Inject 4-6 Units into the skin 3 (three) times daily.  . Insulin Pen Needle (BD PEN NEEDLE NANO U/F) 32G X 4 MM MISC USE AS INSTRUCTED TO INJECT INSULIN 4 TIMES DAILY.  . isosorbide mononitrate (IMDUR) 30 MG 24 hr tablet Take 1 tablet (30 mg total) by mouth daily.  . Marland KitchenANTUS SOLOSTAR 100 UNIT/ML Solostar Pen INJECT SUBCUTANEOUSLY 16  UNITS DAILY IN THE MORNING  . loratadine (CLARITIN) 10 MG tablet Take 10 mg by mouth daily.   . metoprolol tartrate (LOPRESSOR) 25 MG tablet Take 1 tablet (25 mg total) by mouth 2 (two) times daily.  . midodrine (PROAMATINE) 2.5 MG tablet Take 2.5 mg by mouth 3 (three) times daily with meals.  . nitroGLYCERIN (NITROSTAT) 0.4 MG SL tablet Place 1 tablet (0.4 mg total) under the tongue every 5 (five) minutes as needed for chest pain.  . Marland Kitchenndansetron (ZOFRAN-ODT) 4 MG disintegrating tablet Take 1 tablet (4 mg total) by mouth every 8 (eight) hours as needed for nausea or vomiting.  . rosuvastatin (CRESTOR) 40 MG tablet Take 1 tablet (40 mg total) by mouth at bedtime.  . Marland Kitchenolpidem (AMBIEN) 5 MG tablet Take 5 mg by mouth at bedtime.   . [DISCONTINUED] isosorbide mononitrate (IMDUR) 30 MG 24 hr tablet Take 0.5 tablets (15 mg total) by mouth daily.     Allergies: Allergies  Allergen Reactions  . Amoxicillin Rash and Other (See Comments)    Tolerates Cephalosporins Has patient had a PCN reaction causing immediate rash, facial/tongue/throat swelling, SOB or lightheadedness with hypotension: Yes Has patient had a PCN  reaction causing severe rash involving mucus membranes or skin necrosis: Yes Has patient had a PCN reaction that required hospitalization No Has patient had a PCN reaction occurring within the last 10 years: No If all of the above answers are "NO", then may proceed with Cephalosporin use.   . Tape Other (See Comments)    Must use paper tape  . Aldactone [Spironolactone] Other (See Comments)    CKD/hypokalemia  . Imdur [Isosorbide Dinitrate] Other (See Comments)    Headache/severe hypotension/Syncope  . Arimidex [Anastrozole] Nausea Only  . Latex Itching and Other (See Comments)    (Dentist office)  . Tetracycline Rash    Social History: The patient  reports that she has never smoked. She has never used smokeless tobacco. She reports that she does not drink alcohol or use drugs.   Family History: The patient's family history includes Diabetes in her maternal grandmother and son; Healthy in her brother; Heart attack in her brother; Heart disease in her brother, father, and maternal grandmother; Heart failure in her father.   Review of Systems: Please see the history of present illness.   All other systems are reviewed and negative.   Physical Exam: VS:  BP 120/68   Pulse 64   Ht 5' 4"  (1.626 m)   Wt 150 lb (68 kg)   LMP  (LMP Unknown)   SpO2 100%   BMI 25.75 kg/m  .  BMI Body mass index is 25.75 kg/m.  Wt Readings from Last 3 Encounters:  11/04/19 150 lb (68 kg)  10/30/19 147 lb (66.7 kg)  10/14/19 149 lb (67.6 kg)    General: Pleasant. Chronically ill but alert and in no acute distress.  Weight is creeping up.  Cardiac: Regular rate and rhythm. Outflow murmur noted. She has 1+ bilateral edema despite support stockings.  Respiratory:  Lungs are clear to auscultation bilaterally with normal work of breathing.  GI: Soft and nontender.  MS: No deformity or atrophy. Gait and ROM intact.  Skin: Warm and dry. Color is sallowNeuro:  Strength and sensation are intact and no  gross focal deficits noted.  Psych: Alert, appropriate and with normal affect.   LABORATORY DATA:  EKG:  EKG is ordered today.  Personally reviewed by me. This demonstrates sinus rhythm.   Lab Results  Component Value Date   WBC 6.7 10/30/2019   HGB 9.7 (L) 10/30/2019   HCT 33.3 (L) 10/30/2019   PLT 160 10/30/2019   GLUCOSE 184 (H) 10/30/2019   CHOL 106 12/02/2018   TRIG 97 12/02/2018   HDL 34 (L) 12/02/2018   LDLDIRECT 53.0 10/08/2016   LDLCALC 53 12/02/2018   ALT 31 10/30/2019   AST 41 10/30/2019   NA 139 10/30/2019   K 3.6 10/30/2019   CL 100 10/30/2019   CREATININE 2.59 (H) 10/30/2019   BUN 42 (H) 10/30/2019   CO2 24 10/30/2019   TSH 3.090 08/03/2019   INR 1.4 (H) 06/24/2019   HGBA1C 6.1 (H) 08/13/2019   MICROALBUR 14.2 (H) 07/02/2019     BNP (last 3 results) Recent Labs    03/04/19 1429  BNP 273.4*    ProBNP (last 3 results) Recent Labs    02/19/19 1126 02/23/19 1221  PROBNP 2,996* 2,825*     Other Studies Reviewed Today:  ECHO IMPRESSIONS 05/2019  1. Left ventricular ejection fraction, by visual estimation, is 60 to 65%. The left ventricle has normal function. Left ventricular septal wall thickness was normal. Normal left ventricular posterior wall thickness. There  is no left ventricular  hypertrophy.  2. Elevated left ventricular end-diastolic pressure.  3. Left ventricular diastolic parameters are consistent with Grade II diastolic dysfunction (pseudonormalization).  4. Global right ventricle has normal systolic function.The right ventricular size is normal. No increase in right ventricular wall thickness.  5. Left atrial size was severely dilated.  6. Right atrial size was normal.  7. Mild mitral annular calcification.  8. Mild thickening of the anterior and posterior mitral valve leaflet(s).  9. The mitral valve is normal in structure. Mild mitral valve regurgitation. No evidence of mitral stenosis.  10. The tricuspid valve is normal in  structure.  11. Aortic valve regurgitation is not visualized. No evidence of aortic valve sclerosis or stenosis.  12. The pulmonic valve was normal in structure. Pulmonic valve regurgitation is trivial.  13. Normal pulmonary artery systolic pressure.  14. The inferior vena cava is normal in size with greater than 50% respiratory variability, suggesting right atrial pressure of 3 mmHg.   CT CHEST IMPRESSION 04/2019:  Moderate right pleural effusion, increased from prior CT. Associated  right lower lobe atelectasis.  No evidence of pneumonia.  1.2 x 2.4 cm lesion along the inferior aspect of the right  breast/chest wall. While additional findings noted above correlate  with areas of fat necrosis, this lesion was not clearly identified  when correlating with prior breast tomography report. Breast imaging  consultation is suggested for further evaluation, including  potential ultrasound with biopsy versus breast MR. These results  will be called to the ordering clinician or representative by the  Radiologist Assistant, and communication documented in the PACS or  zVision Dashboard.  Aortic Atherosclerosis (ICD10-I70.0).  Electronically Signed  By: Julian Hy M.D.  On: 05/06/2019 18:11   CT ABDOMEN IMPRESSION 03/2019: Chest Impression:  1. Mild RIGHT basilar atelectasis and small RIGHT effusion.  2. Increased skin thickening in the lateral RIGHT breast. Recommend  clinical correlation.  3. Stable seroma in the RIGHT breast.   Abdomen / Pelvis Impression:  1. No acute findings in the abdomen pelvis.  2. Nonobstructing calculi in the LEFT renal pelvis.  3. Degenerative endplate change in the lumbar spine similar to  comparison CT 2019.  4. Coronary artery calcification and Aortic Atherosclerosis  (ICD10-I70.0).  Electronically Signed  By: Suzy Bouchard M.D.  On: 03/17/2019 09:59   ABD [place] 02/17/2019  Other findings: There is a somewhat lobulated fluid collection    identified along the inferior aspect of the liver. This measures 2.6  x 3.4 x 4.3 cm. This was not seen on the prior CT examination and  may be related to a focal fluid collection from prior  cholecystectomy. Mild perihepatic ascites is noted.  Note is made of right-sided pleural effusion.  IMPRESSION:  Status post cholecystectomy. There is a lobulated cystic area as  described above along the inferior right hepatic margin. This could  be related to the prior surgery possibly representing a small  seroma. CT may be helpful for further evaluation.  Small right pleural effusion.  Mild ascites.  Increased echogenicity within the liver likely related to fatty  infiltration or underlying hepatocellular disease.  Electronically Signed  By: Inez Catalina M.D.  On: 02/17/2019 10:18    Breast Ultrasound 01/2019  IMPRESSION:  1. New irregular hypoechoic mass along the posterior margin of the  scar right breast 8 o'clock position. Findings are nonspecific  however concerning for the possibility of localized recurrence.  2. Within the outer aspect of the right axilla there  are two  adjacent irregular hypoechoic masses just deep to the skin which are  nonspecific. These may represent focal fat necrosis or potentially  recurrent or metastatic disease.  RECOMMENDATION:  1. Ultrasound-guided core needle biopsy right breast mass 8 o'clock  position just deep to the scar.  2. Ultrasound-guided core needle biopsy of both irregular hypoechoic  masses within the outer aspect of the right axilla.    MYOVIEW FINDINGS FROM 06/2013:  Normal resting EKG. Slight ST depressions noted in aVL after  administration of lexiscan. Mild shortness of breath and nausea  resolved after the test. EKG is nondiagnostic for ischemia. TID  ratio 1.05. Lung-heart ratio 0.43. Normal ventricular chamber size.  IMPRESSION:  No evidence for ischemia. Normal wall motion. LVEF 72%.  Electronically Signed  By: Guy Sandifer     Cardiac Cath: 02/23/2013  Left mainstem: Normal  Left anterior descending (LAD): Severe proximal and mid calcification with long proximal 95% stenosis. The mid and distal vessel is small but free of high grade disease.  Left circumflex (LCx): AV groove has a mid 90% stenosis before a moderate sized MOM. OM1 and OM2 are occluded at the ostium and fill via the SVG. OM3 occluded at the ostium and fills via SVG. The grafted OMs are small and diffusely disease.  Right coronary artery (RCA): Occluded in the mid vessel. The PDA is moderate sized and occluded at the ostium. There is a 70% stenosis in the PDA after the insertion of the vein graft.  Grafts:  LIMA to LAD: Patent  SVG to RCA: Occluded (from original CABG)  SVG to RCA: Patent (from redo CABG). This is mild diffuse plaque within the vein graft.  SVG to OM3: Patent. This is mild diffuse plaque within the vein graft.  SVG sequential to OM1/OM2: Patent. There is ostial 50% stenosis. There is a patent proximal SVG stent. The native marginals are very small.  Left ventriculography: LV not injected. AVR not crossed.  Final Conclusions: Severe native vessel CAD. Patent grafts as described with nonobstructive disease in the grafts. A stent placed into the sequential SVG is patent with some disease at the ostium that on several views in not occlusive. I did compare the 2009 cath with results today. There is high grade disease in the circumflex AV groove. However, this is not changed from 2009. However, this lesion does appear to lead into a non grafted OM. If she has further symptoms I would consider PCI of the native circumflex.     Assessment/Plan:  1. CAD with prior CABG as well as redo CABG - last in 2009 - last cath from 2014 - she is managed medically - we have added back Imdur with some improvement in her chest pain without major drop in BP - will try to increase this further to 30 mg a day. She is a poor candidate if repeat cath were  needed due to CKD.   2. Recurrent kidney stone - following with GU  3. Prior pleural effusion - resolved - had talc/pleurex  4. CKD - recent lab noted - she follows with Renal.   5. Orthostatic hypotension - with prior injury x 2 - on Midodrine - would continue.   6. Chronic diastolic HF - weight is creeping up - she does better with weights 142 to 145 - I think her anemia impacts this greatly as well - will alternate 20 and 40 mg of Lasix daily for a week - she is to let me know next  week how she is doing.   7. Chronic anemia - has had recent transfusion - followed closely by Hematology.   8. Carotid disease - had 1 to 39% bilateral stenosis from study 2 years ago - this needs updating - was not discussed today - will need to discuss on return.   9. Prior breast cancer - she has had follow up studies and biopsies.   10. PAF - in sinus - on amiodarone - has ILR in place as well. Will need to make sure surveillance lab all up to date on return.   Current medicines are reviewed with the patient today.  The patient does not have concerns regarding medicines other than what has been noted above.  The following changes have been made:  See above.  Labs/ tests ordered today include:    Orders Placed This Encounter  Procedures  . EKG 12-Lead     Disposition:   FU with me in 4 weeks. Need to arrange carotid doppler and probable lab on return for her amiodarone.    Patient is agreeable to this plan and will call if any problems develop in the interim.   SignedTruitt Merle, NP  11/04/2019 5:15 PM  [facility] [address] [place], [place]  [address] Phone: [phone] Fax: [phone]

## 2019-10-29 ENCOUNTER — Other Ambulatory Visit: Payer: Medicare Other

## 2019-10-29 ENCOUNTER — Inpatient Hospital Stay: Payer: Medicare Other

## 2019-10-29 ENCOUNTER — Other Ambulatory Visit: Payer: Self-pay

## 2019-10-29 ENCOUNTER — Ambulatory Visit: Payer: Medicare Other

## 2019-10-29 ENCOUNTER — Encounter (HOSPITAL_BASED_OUTPATIENT_CLINIC_OR_DEPARTMENT_OTHER): Payer: Medicare Other | Admitting: Internal Medicine

## 2019-10-29 VITALS — BP 119/39 | HR 63 | Temp 98.6°F

## 2019-10-29 DIAGNOSIS — I129 Hypertensive chronic kidney disease with stage 1 through stage 4 chronic kidney disease, or unspecified chronic kidney disease: Secondary | ICD-10-CM | POA: Diagnosis not present

## 2019-10-29 DIAGNOSIS — D638 Anemia in other chronic diseases classified elsewhere: Secondary | ICD-10-CM

## 2019-10-29 LAB — CBC WITH DIFFERENTIAL (CANCER CENTER ONLY)
Abs Immature Granulocytes: 0.02 10*3/uL (ref 0.00–0.07)
Basophils Absolute: 0.1 10*3/uL (ref 0.0–0.1)
Basophils Relative: 1 %
Eosinophils Absolute: 0.2 10*3/uL (ref 0.0–0.5)
Eosinophils Relative: 4 %
HCT: 29.3 % — ABNORMAL LOW (ref 36.0–46.0)
Hemoglobin: 8.4 g/dL — ABNORMAL LOW (ref 12.0–15.0)
Immature Granulocytes: 0 %
Lymphocytes Relative: 13 %
Lymphs Abs: 0.7 10*3/uL (ref 0.7–4.0)
MCH: 26.8 pg (ref 26.0–34.0)
MCHC: 28.7 g/dL — ABNORMAL LOW (ref 30.0–36.0)
MCV: 93.6 fL (ref 80.0–100.0)
Monocytes Absolute: 0.8 10*3/uL (ref 0.1–1.0)
Monocytes Relative: 14 %
Neutro Abs: 3.7 10*3/uL (ref 1.7–7.7)
Neutrophils Relative %: 68 %
Platelet Count: 142 10*3/uL — ABNORMAL LOW (ref 150–400)
RBC: 3.13 MIL/uL — ABNORMAL LOW (ref 3.87–5.11)
RDW: 15.9 % — ABNORMAL HIGH (ref 11.5–15.5)
WBC Count: 5.5 10*3/uL (ref 4.0–10.5)
nRBC: 0 % (ref 0.0–0.2)

## 2019-10-29 LAB — SAMPLE TO BLOOD BANK

## 2019-10-29 MED ORDER — EPOETIN ALFA-EPBX 40000 UNIT/ML IJ SOLN
40000.0000 [IU] | Freq: Once | INTRAMUSCULAR | Status: AC
  Administered 2019-10-29: 40000 [IU] via SUBCUTANEOUS

## 2019-10-29 MED ORDER — EPOETIN ALFA-EPBX 40000 UNIT/ML IJ SOLN
INTRAMUSCULAR | Status: AC
  Filled 2019-10-29: qty 1

## 2019-10-29 NOTE — Patient Instructions (Signed)

## 2019-10-30 ENCOUNTER — Encounter (HOSPITAL_BASED_OUTPATIENT_CLINIC_OR_DEPARTMENT_OTHER): Payer: Self-pay | Admitting: *Deleted

## 2019-10-30 ENCOUNTER — Emergency Department (HOSPITAL_BASED_OUTPATIENT_CLINIC_OR_DEPARTMENT_OTHER): Payer: Medicare Other

## 2019-10-30 ENCOUNTER — Other Ambulatory Visit: Payer: Self-pay

## 2019-10-30 ENCOUNTER — Emergency Department (HOSPITAL_BASED_OUTPATIENT_CLINIC_OR_DEPARTMENT_OTHER)
Admission: EM | Admit: 2019-10-30 | Discharge: 2019-10-30 | Disposition: A | Discharge status: Home / Self Care | Payer: Medicare Other | Attending: Emergency Medicine | Admitting: Emergency Medicine

## 2019-10-30 ENCOUNTER — Other Ambulatory Visit: Payer: Self-pay | Admitting: Internal Medicine

## 2019-10-30 DIAGNOSIS — Z794 Long term (current) use of insulin: Secondary | ICD-10-CM | POA: Insufficient documentation

## 2019-10-30 DIAGNOSIS — Z91040 Latex allergy status: Secondary | ICD-10-CM | POA: Insufficient documentation

## 2019-10-30 DIAGNOSIS — N184 Chronic kidney disease, stage 4 (severe): Secondary | ICD-10-CM | POA: Insufficient documentation

## 2019-10-30 DIAGNOSIS — R1031 Right lower quadrant pain: Secondary | ICD-10-CM | POA: Insufficient documentation

## 2019-10-30 DIAGNOSIS — I130 Hypertensive heart and chronic kidney disease with heart failure and stage 1 through stage 4 chronic kidney disease, or unspecified chronic kidney disease: Secondary | ICD-10-CM | POA: Insufficient documentation

## 2019-10-30 DIAGNOSIS — Z79899 Other long term (current) drug therapy: Secondary | ICD-10-CM | POA: Insufficient documentation

## 2019-10-30 DIAGNOSIS — E1122 Type 2 diabetes mellitus with diabetic chronic kidney disease: Secondary | ICD-10-CM | POA: Diagnosis not present

## 2019-10-30 DIAGNOSIS — N201 Calculus of ureter: Secondary | ICD-10-CM | POA: Insufficient documentation

## 2019-10-30 DIAGNOSIS — I5042 Chronic combined systolic (congestive) and diastolic (congestive) heart failure: Secondary | ICD-10-CM | POA: Diagnosis not present

## 2019-10-30 DIAGNOSIS — R1110 Vomiting, unspecified: Secondary | ICD-10-CM | POA: Diagnosis present

## 2019-10-30 LAB — COMPREHENSIVE METABOLIC PANEL
ALT: 31 U/L (ref 0–44)
AST: 41 U/L (ref 15–41)
Albumin: 3.1 g/dL — ABNORMAL LOW (ref 3.5–5.0)
Alkaline Phosphatase: 63 U/L (ref 38–126)
Anion gap: 15 (ref 5–15)
BUN: 42 mg/dL — ABNORMAL HIGH (ref 8–23)
CO2: 24 mmol/L (ref 22–32)
Calcium: 8.7 mg/dL — ABNORMAL LOW (ref 8.9–10.3)
Chloride: 100 mmol/L (ref 98–111)
Creatinine, Ser: 2.59 mg/dL — ABNORMAL HIGH (ref 0.44–1.00)
GFR calc Af Amer: 20 mL/min — ABNORMAL LOW (ref >60)
GFR calc non Af Amer: 18 mL/min — ABNORMAL LOW (ref >60)
Glucose, Bld: 184 mg/dL — ABNORMAL HIGH (ref 70–99)
Potassium: 3.6 mmol/L (ref 3.5–5.1)
Sodium: 139 mmol/L (ref 135–145)
Total Bilirubin: 0.9 mg/dL (ref 0.3–1.2)
Total Protein: 6.9 g/dL (ref 6.5–8.1)

## 2019-10-30 LAB — CBC WITH DIFFERENTIAL/PLATELET
Abs Immature Granulocytes: 0.02 10*3/uL (ref 0.00–0.07)
Basophils Absolute: 0.1 10*3/uL (ref 0.0–0.1)
Basophils Relative: 1 %
Eosinophils Absolute: 0 10*3/uL (ref 0.0–0.5)
Eosinophils Relative: 0 %
HCT: 33.3 % — ABNORMAL LOW (ref 36.0–46.0)
Hemoglobin: 9.7 g/dL — ABNORMAL LOW (ref 12.0–15.0)
Immature Granulocytes: 0 %
Lymphocytes Relative: 8 %
Lymphs Abs: 0.5 10*3/uL — ABNORMAL LOW (ref 0.7–4.0)
MCH: 26.5 pg (ref 26.0–34.0)
MCHC: 29.1 g/dL — ABNORMAL LOW (ref 30.0–36.0)
MCV: 91 fL (ref 80.0–100.0)
Monocytes Absolute: 0.7 10*3/uL (ref 0.1–1.0)
Monocytes Relative: 11 %
Neutro Abs: 5.4 10*3/uL (ref 1.7–7.7)
Neutrophils Relative %: 80 %
Platelets: 160 10*3/uL (ref 150–400)
RBC: 3.66 MIL/uL — ABNORMAL LOW (ref 3.87–5.11)
RDW: 15.9 % — ABNORMAL HIGH (ref 11.5–15.5)
WBC: 6.7 10*3/uL (ref 4.0–10.5)
nRBC: 0 % (ref 0.0–0.2)

## 2019-10-30 LAB — URINALYSIS, ROUTINE W REFLEX MICROSCOPIC
Bilirubin Urine: NEGATIVE
Glucose, UA: >=500 mg/dL — AB
Ketones, ur: 15 mg/dL — AB
Leukocytes,Ua: NEGATIVE
Nitrite: NEGATIVE
Protein, ur: 100 mg/dL — AB
Specific Gravity, Urine: 1.02 (ref 1.005–1.030)
pH: 7 (ref 5.0–8.0)

## 2019-10-30 LAB — URINALYSIS, MICROSCOPIC (REFLEX)

## 2019-10-30 LAB — LIPASE, BLOOD: Lipase: 27 U/L (ref 11–51)

## 2019-10-30 MED ORDER — ONDANSETRON 4 MG PO TBDP
4.0000 mg | ORAL_TABLET | Freq: Three times a day (TID) | ORAL | 0 refills | Status: DC | PRN
Start: 2019-10-30 — End: 2020-01-19

## 2019-10-30 MED ORDER — ONDANSETRON HCL 4 MG/2ML IJ SOLN
4.0000 mg | Freq: Once | INTRAMUSCULAR | Status: AC
  Administered 2019-10-30: 4 mg via INTRAVENOUS
  Filled 2019-10-30: qty 2

## 2019-10-30 MED ORDER — HYDROCODONE-ACETAMINOPHEN 5-325 MG PO TABS: 1.0000 | ORAL_TABLET | Freq: Four times a day (QID) | ORAL | 0 refills | Status: DC | PRN

## 2019-10-30 MED ORDER — HYDROCODONE-ACETAMINOPHEN 5-325 MG PO TABS
1.0000 | ORAL_TABLET | Freq: Once | ORAL | Status: AC
  Administered 2019-10-30: 1 via ORAL
  Filled 2019-10-30: qty 1

## 2019-10-30 NOTE — ED Notes (Signed)
ED Provider at bedside. 

## 2019-10-30 NOTE — ED Triage Notes (Signed)
Pt c/o n/v x 6 hrs , right flank pain radiates around to right lower

## 2019-10-31 NOTE — ED Provider Notes (Signed)
[facility] EMER[facility]Y DEPARTMENT Provider Note   CSN: [id number] Arrival date & time: 10/30/19  2047     History Chief Complaint  Patient presents with  . Emesis    Hannah [name] is a 74 y.o. female.  HPI Patient presents with nausea vomiting and right flank/abdominal pain.  Began around 6 hours prior to arrival.  States began acutely.  Has a history of kidney stones but states this does feel somewhat different.  No relief with Zofran at home.  States no dysuria.  No fevers.  Pain is sharp.  It has been constant since it started.  Does not feel as if she has to urinate.    Past Medical History:  Diagnosis Date  . Abnormally small mouth   . Allergic rhinitis 10/14/2009   Qualifier: Diagnosis of  By: Lamonte Sakai MD, Rose Fillers   Overview:  Overview:  Qualifier: Diagnosis of  By: Lamonte Sakai MD, Rose Fillers  Last Assessment & Plan:  Please continue Xyzal and Nasacort as you have been using them  . Anemia   . Asthma 05/12/2009   10/12/2014 p extensive coaching HFA effectiveness =    75% s spacer    Overview:  Overview:  10/12/2014 p extensive coaching HFA effectiveness =    75% s spacer   Last Assessment & Plan:  Please continue Symbicort 2 puffs twice a day. Remember to rinse and gargle after taking this medication. Take albuterol 2 puffs up to every 4 hours if needed for shortness of breath.  Follow with Dr Lamonte Sakai in 6 month  . Bilateral carotid artery stenosis    a. mild 1-39% by duplex 10/2017, due 2021.  . Breast cancer ([place]) 01/18/2016   right breast  . CAD (coronary artery disease) 01/24/2011   a. s/p CABG 1998 with redo 2009.  Marland Kitchen Chemotherapy induced nausea and vomiting 02/24/2016  . Chemotherapy-induced peripheral neuropathy ([place]) 04/27/2016  . 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 Chronic diastolic CHF (congestive heart failure) ([place]) 09/24/2013  . CKD (chronic kidney disease), stage IV ([place]) 09/24/2013  . Closed fracture of head of left humerus 09/09/2017  . Complication of  anesthesia   . Dysrhythmia    a-fib  . Gallstones   . GERD (gastroesophageal reflux disease)   . Glaucoma   . H/O atrial tachycardia 05/12/2009   Qualifier: History of  By: Lamonte Sakai MD, Rose Fillers   Overview:  Overview:  Qualifier: History of  By: Lamonte Sakai MD, Rose Fillers  Last Assessment & Plan:  There was no evidence of this on her cardiac monitor. I would recommend watchful waiting.   Marland Kitchen Heart murmur   . History of kidney stones   . History of non-ST elevation myocardial infarction (NSTEMI)    Sept 2014--  thought to be type II HTN w/ LHC without infarct related artery and patent grafts  . History of radiation therapy 05/24/16-07/26/16   right breast 50.4 Gy in 28 fractions, right breast boost 10 Gy in 5 fractions  . Hyperlipidemia   . Hypotension   . Iron deficiency anemia   . Mania ([place]) 03/05/2016  . Moderate persistent asthma    pulmologist-  Dr. Malvin Johns  . Osteomyelitis of toe of left foot ([place]) 04/06/2016  . Osteopenia of multiple sites 10/19/2015  . PAF (paroxysmal atrial fibrillation) ([place])   . Personal history of chemotherapy 2017  . Personal history of radiation therapy 2017  . PONV (postoperative nausea and vomiting)   . Port catheter in place 02/17/2016  . Psoriasis  right leg  . Renal calculus, right   . S/P AVR    prosthesis valve placement 2009 at same time re-do CABG  . Seizure-like activity ([place]) 09/01/2017  . Sensorineural hearing loss (SNHL) of both ears 01/06/2016  . Stroke [facility]) 2014   residual rt hearing loss  . Syncope 09/06/2017  . Type 2 diabetes mellitus ([place])    monitored by dr Dwyane Dee    Patient Active Problem List   Diagnosis Date Noted  . Pain in left shoulder 03/26/2019  . History of breast cancer in female 03/26/2019  . Hypokalemia 02/20/2019  . Acute exacerbation of CHF (congestive heart failure) ([place]) 02/19/2019  . Pleural effusion on right 09/25/2018  . Thrombocytopenia ([place]) 09/25/2018  . Cholecystitis with cholelithiasis 07/07/2018  . Prolonged Q-T  interval on ECG 05/21/2018  . Atrial fibrillation ([place]) 01/23/2018  . Closed fracture of head of left humerus 09/09/2017  . Syncope 09/06/2017  . Seizure-like activity ([place]) 09/01/2017  . AKI (acute kidney injury) ([place]) 09/01/2017  . Adrenal insufficiency ([place]) 07/03/2016  . Seizure ([place]) 06/29/2016  . Chemotherapy-induced peripheral neuropathy ([place]) 04/27/2016  . 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 Osteomyelitis of toe of left foot ([place]) 04/06/2016  . Mania ([place]) 03/05/2016  . GERD (gastroesophageal reflux disease) 02/29/2016  . Chemotherapy induced nausea and vomiting 02/24/2016  . Port catheter in place 02/17/2016  . Sensorineural hearing loss (SNHL) of both ears 01/06/2016  . Breast cancer of upper-inner quadrant of right female breast ([place]) 12/16/2015  . Osteopenia of multiple sites 10/19/2015  . CKD (chronic kidney disease), stage IV ([place]) 09/24/2013  . Chronic diastolic CHF (congestive heart failure) ([place]) 09/24/2013  . Acute on chronic combined systolic and diastolic congestive heart failure ([place]) 07/17/2013  . Atrial tachycardia ([place]) 04/03/2013  . CAD (coronary artery disease) 01/24/2011  . Chronic coronary artery disease 01/24/2011  . History of aortic valve replacement 10/27/2010  . Hyperlipidemia   . DOE (dyspnea on exertion)   . Nonischemic cardiomyopathy ([place])   . Anemia of chronic disease   . Type 2 diabetes mellitus ([place])   . Iron deficiency anemia   . Allergic rhinitis 10/14/2009  . Essential hypertension 05/12/2009  . H/O atrial tachycardia 05/12/2009  . Asthma 05/12/2009    Past Surgical History:  Procedure Laterality Date  . AORTIC VALVE REPLACEMENT  [facility]   [facility]  . BREAST LUMPECTOMY Right 01/18/2016  . BREAST LUMPECTOMY WITH RADIOACTIVE SEED AND SENTINEL LYMPH NODE BIOPSY Right 01/18/2016   Procedure: RIGHT BREAST LUMPECTOMY WITH RADIOACTIVE SEED AND SENTINEL LYMPH NODE BIOPSY;   Surgeon: DAlphonsa Overall MD;  [facility]  Service: General;  Laterality: Right;  . CARDIAC CATHETERIZATION  03/23/2008   Pre-redo CABG: L main OK, LAD (T), CFX (T), OM1 99%, RCA (T), LIMA-LAD OK, SVG-OM(?3) OK w/ little florw to OM2, SVG-RCA OK. EF NL  . CARPAL TUNNEL RELEASE    . CHEST TUBE INSERTION Right 06/26/2019   Procedure: INSERTION PLEURAL DRAINAGE CATHETER;  Surgeon: BGaye Pollack MD;  Location[facility]  Service: Thoracic;  Laterality: Right;  . CHOLECYSTECTOMY N/A 07/07/2018   Procedure: LAPAROSCOPIC CHOLECYSTECTOMY WITH INTRAOPERATIVE CHOLANGIOGRAM ERAS PATHWAY;  Surgeon: NAlphonsa Overall MD;  Location: [facility];  Service: General;  Laterality: N/A;  . COLONOSCOPY     around 2015. Possibly with [facility]   . CORONARY ARTERY BYPASS GRAFT  1998 &  re-do 2009   Had LIMA to DX/LAD, SVG to 2 marginal branches  and SVG to [facility] originally; SVG to 3rd OM and PD at time of redo  . CYSTOSCOPY W/ URETERAL STENT PLACEMENT Right 12/20/2014   Procedure: CYSTOSCOPY WITH RETROGRADE PYELOGRAM/URETERAL STENT PLACEMENT;  Surgeon: Cleon Gustin, MD;  Location: [facility];  Service: Urology;  Laterality: Right;  . CYSTOSCOPY WITH RETROGRADE PYELOGRAM, URETEROSCOPY AND STENT PLACEMENT Left 08/21/2019   Procedure: CYSTOSCOPY WITH RETROGRADE PYELOGRAM, URETEROSCOPY AND STENT PLACEMENT;  Surgeon: Cleon Gustin, MD;  Location[facility];  Service: Urology;  Laterality: Left;  1 HR  . ESOPHAGOGASTRODUODENOSCOPY     many years ago per patient   . ESOPHAGOGASTRODUODENOSCOPY ENDOSCOPY  06/17/2018  . EYE SURGERY Bilateral    cataracts  . HOLMIUM LASER APPLICATION Right 7/86/7544   Procedure:  HOLMIUM LASER LITHOTRIPSY;  Surgeon: Cleon Gustin, MD;  Location: [facility];  Service: Urology;  Laterality: Right;  . HOLMIUM LASER APPLICATION Left 02/28/1006   Procedure: HOLMIUM LASER APPLICATION;  Surgeon: Cleon Gustin, MD;  [facility];  Service: Urology;  Laterality:  Left;  . IR THORACENTESIS ASP PLEURAL SPACE W/IMG GUIDE  10/03/2018  . IR THORACENTESIS ASP PLEURAL SPACE W/IMG GUIDE  06/08/2019  . LEFT HEART CATHETERIZATION WITH CORONARY/GRAFT ANGIOGRAM N/A 02/23/2013   Procedure: LEFT HEART CATHETERIZATION WITH Beatrix Fetters;  Surgeon: Blane Ohara, MD;  Location: [facility] CATH LAB;  Service: Cardiovascular;  Laterality: N/A;  . LOOP RECORDER INSERTION N/A 08/30/2017   Procedure: LOOP RECORDER INSERTION;  Surgeon: Evans Lance, MD;  Location: [facility] CV LAB;  Service: Cardiovascular;  Laterality: N/A;  . PORTACATH PLACEMENT Left 01/18/2016   Procedure: INSERTION PORT-A-CATH;  Surgeon: Alphonsa Overall, MD;  Location: [facility];  Service: General;  Laterality: Left;  . portacath removal    . REMOVAL OF PLEURAL DRAINAGE CATHETER Right 07/14/2019   Procedure: REMOVAL OF PLEURAL DRAINAGE CATHETER;  Surgeon: Gaye Pollack, MD;  Location: [facility];  Service: Thoracic;  Laterality: Right;  . TALC PLEURODESIS N/A 06/26/2019   Procedure: Pietro Cassis;  Surgeon: Gaye Pollack, MD;  [facility];  Service: Thoracic;  Laterality: N/A;  . TONSILLECTOMY    . TRANSTHORACIC ECHOCARDIOGRAM  02-24-2013      mild LVH,  ef 50-55%/  AV bioprosthesis was present with very mild stenosis and no regurg., mean grandient 62mHg, peak grandient 261mg /  mild MR/  mild LAE and RAE/  moderate TR  . TUBAL LIGATION       OB History   No obstetric history on file.     Family History  Problem Relation Age of Onset  . Heart disease Father   . Heart failure Father   . Diabetes Maternal Grandmother   . Heart disease Maternal Grandmother   . Diabetes Son   . Healthy Brother        #1  . Heart attack Brother        #2  . Heart disease Brother        #2  . Colon cancer Neg Hx   . Esophageal cancer Neg Hx     Social History   Tobacco Use  . Smoking status: Never Smoker  . Smokeless tobacco: Never Used  Substance Use Topics  . Alcohol use: No  . Drug use: No     Home Medications Prior to Admission medications   Medication Sig Start Date End Date Taking? Authorizing Provider  ACCU-CHEK GUIDE test strip Use to test blood sugar 3 times daily 10/02/19  Elayne Snare, MD  acetaminophen (TYLENOL) 325 MG tablet Take 2 tablets (650 mg total) by mouth every 6 (six) hours as needed for mild pain (or Fever >/= 101). 09/03/17   Eulogio Bear U, DO  albuterol (VENTOLIN HFA) 108 (90 Base) MCG/ACT inhaler Inhale 2 puffs into the lungs every 6 (six) hours as needed for wheezing or shortness of breath. 10/07/19   Collene Gobble, MD  amiodarone (PACERONE) 200 MG tablet Take 1 tablet (200 mg total) by mouth daily. 02/03/19   Evans Lance, MD  Blood Glucose Monitoring Suppl (ACCU-CHEK GUIDE ME) w/Device KIT Use accu chek meter to check blood sugar three times daily. 04/10/19   Elayne Snare, MD  budesonide-formoterol [facility]) 160-4.5 MCG/ACT inhaler Inhale 2 puffs into the lungs 2 (two) times daily. 10/07/19   Collene Gobble, MD  Dulaglutide (TRULICITY) 1.5 NT/7.0YF SOPN Inject 1.5 mg into the skin once a week. 10/28/19   Elayne Snare, MD  ELIQUIS 5 MG TABS tablet Take 1 tablet (5 mg total) by mouth 2 (two) times daily. 09/23/19   Evans Lance, MD  epoetin alfa-epbx (RETACRIT) 74944 UNIT/ML injection Inject 40,000 Units into the skin every Thursday.     [provider]  exemestane (AROMASIN) 25 MG tablet TAKE ONE TABLET BY MOUTH DAILY AFTER BREAKFAST 09/23/19   Nicholas Lose, MD  ezetimibe (ZETIA) 10 MG tablet TAKE ONE TABLET BY MOUTH EVERY DAY 10/30/19   [name], NP  famotidine (PEPCID) 20 MG tablet Take 20 mg by mouth 2 (two) times daily.    [provider]  furosemide (LASIX) 20 MG tablet Take 20 mg by mouth daily as needed.    [provider]  HYDROcodone-acetaminophen (NORCO/VICODIN) 5-325 MG tablet Take 1-2 tablets by mouth every 6 (six) hours as needed. 10/30/19   Davonna Belling, MD  insulin glargine (LANTUS) 100 UNIT/ML  injection Inject 18 Units into the skin daily.    [provider]  Insulin Lispro (HUMALOG KWIKPEN ) Inject 4-6 Units into the skin 3 (three) times daily.    [provider]  Insulin Pen Needle (BD PEN NEEDLE NANO U/F) 32G X 4 MM MISC USE AS INSTRUCTED TO INJECT INSULIN 4 TIMES DAILY. 09/16/19   Elayne Snare, MD  isosorbide mononitrate (IMDUR) 30 MG 24 hr tablet Take 0.5 tablets (15 mg total) by mouth daily. 10/14/19 01/12/20  [name], NP  LANTUS SOLOSTAR 100 UNIT/ML Solostar Pen INJECT SUBCUTANEOUSLY 16  UNITS DAILY IN THE MORNING 10/16/19   Elayne Snare, MD  loratadine (CLARITIN) 10 MG tablet Take 10 mg by mouth daily.     [provider]  metoprolol tartrate (LOPRESSOR) 25 MG tablet Take 1 tablet (25 mg total) by mouth 2 (two) times daily. 12/24/18 07/08/20  [name], NP  midodrine (PROAMATINE) 2.5 MG tablet Take 2.5 mg by mouth 3 (three) times daily with meals.    [provider]  nitroGLYCERIN (NITROSTAT) 0.4 MG SL tablet Place 1 tablet (0.4 mg total) under the tongue every 5 (five) minutes as needed for chest pain. 09/19/18 07/08/20  Evans Lance, MD  ondansetron (ZOFRAN-ODT) 4 MG disintegrating tablet Take 1 tablet (4 mg total) by mouth every 8 (eight) hours as needed for nausea or vomiting. 10/30/19   Davonna Belling, MD  rosuvastatin (CRESTOR) 40 MG tablet Take 1 tablet (40 mg total) by mouth at bedtime. 03/11/19   [name], NP  zolpidem (AMBIEN) 5 MG tablet Take 5 mg by mouth at  bedtime.     [provider]    Allergies    Amoxicillin, Tape, Aldactone [spironolactone], Imdur [isosorbide dinitrate], Arimidex [anastrozole], Latex, and Tetracycline  Review of Systems   Review of Systems  Constitutional: Positive for appetite change.  HENT: Negative for congestion.   Respiratory: Negative for shortness of breath.   Gastrointestinal: Positive for abdominal pain, nausea and vomiting.  Genitourinary: Positive for flank pain.  Negative for dysuria.  Musculoskeletal: Positive for back pain.  Skin: Negative for rash.  Neurological: Negative for weakness.  Psychiatric/Behavioral: Negative for confusion.    Physical Exam Updated Vital Signs BP (!) 187/59 (BP Location: Left Arm)   Pulse 61   Temp 97.9 F (36.6 C) (Oral)   Resp 18   Ht 5' 4"  (1.626 m)   Wt 66.7 kg   LMP  (LMP Unknown)   SpO2 97%   BMI 25.23 kg/m   Physical Exam Vitals and nursing note reviewed.  HENT:     Head: Normocephalic.  Cardiovascular:     Rate and Rhythm: Regular rhythm.  Pulmonary:     Breath sounds: No wheezing or rhonchi.  Abdominal:     Tenderness: There is abdominal tenderness.     Comments: Right lateral flank tenderness.  Right lower quadrant and some suprapubic tenderness.  No mass.  Mild left lower quadrant tenderness.  No hernia palpated.  Genitourinary:    Comments: No CVA tenderness. Musculoskeletal:        General: No tenderness.  Skin:    General: Skin is warm.     Capillary Refill: Capillary refill takes less than 2 seconds.  Neurological:     Mental Status: She is alert and oriented to person, place, and time.     ED Results / Procedures / Treatments   Labs (all labs ordered are listed, but only abnormal results are displayed) Labs Reviewed  CBC WITH DIFFERENTIAL/PLATELET - Abnormal; Notable for the following components:      Result Value   RBC 3.66 (*)    Hemoglobin 9.7 (*)    HCT 33.3 (*)    MCHC 29.1 (*)    RDW 15.9 (*)    Lymphs Abs 0.5 (*)    All other components within normal limits  COMPREHENSIVE METABOLIC PANEL - Abnormal; Notable for the following components:   Glucose, Bld 184 (*)    BUN 42 (*)    Creatinine, Ser 2.59 (*)    Calcium 8.7 (*)    Albumin 3.1 (*)    GFR calc non Af Amer 18 (*)    GFR calc Af Amer 20 (*)    All other components within normal limits  URINALYSIS, ROUTINE W REFLEX MICROSCOPIC - Abnormal; Notable for the following components:   Glucose, UA >=500 (*)     Hgb urine dipstick MODERATE (*)    Ketones, ur 15 (*)    Protein, ur 100 (*)    All other components within normal limits  URINALYSIS, MICROSCOPIC (REFLEX) - Abnormal; Notable for the following components:   Bacteria, UA RARE (*)    All other components within normal limits  LIPASE, BLOOD    EKG None  Radiology CT Renal Stone Study  Result Date: 10/30/2019 CLINICAL DATA:  Right flank pain radiating to right lower abdomen, nausea and vomiting for 6 hours EXAM: CT ABDOMEN AND PELVIS WITHOUT CONTRAST TECHNIQUE: Multidetector CT imaging of the abdomen and pelvis was performed following the standard protocol without IV contrast. COMPARISON:  07/28/2019 FINDINGS: Lower chest: Trace right pleural  effusion with right pleural calcifications unchanged. The stable skin thickening throughout the right breast, please correlate with any recent mammographic evaluation. Hepatobiliary: Stable right lobe hepatic cyst. No focal liver abnormalities otherwise. Status post cholecystectomy. No biliary dilatation. Pancreas: Unremarkable. No pancreatic ductal dilatation or surrounding inflammatory changes. Spleen: Normal in size without focal abnormality. Adrenals/Urinary Tract: There is right-sided obstructive uropathy related to an obstructing right UVJ calculus, measuring 4 mm on image 74. Numerous calcifications of the right renal hilum are consistent with vascular calcifications. The left kidney is unremarkable. Bladder is decompressed. The adrenals are normal. Stomach/Bowel: No bowel obstruction or ileus. Normal appendix right lower quadrant. No bowel wall thickening or inflammatory changes. Vascular/Lymphatic: Aortic atherosclerosis. No enlarged abdominal or pelvic lymph nodes. Reproductive: Uterus and bilateral adnexa are unremarkable. Other: No abdominal wall hernia or abnormality. No abdominopelvic ascites. Musculoskeletal: No acute or destructive bony lesions. Reconstructed images demonstrate no additional  findings. IMPRESSION: 1. Right-sided obstructive uropathy related to an obstructing 4 mm right UVJ calculus. 2. Stable trace right pleural effusion with right pleural calcifications. 3. Stable skin thickening throughout the right breast, likely related to prior therapy for right breast cancer 2017. Please correlate with any recent mammographic evaluation. 4. Aortic Atherosclerosis (ICD10-I70.0). Electronically Signed   By: Randa Ngo M.D.   On: 10/30/2019 23:06    Procedures Procedures (including critical care time)  Medications Ordered in ED Medications  ondansetron [facility]) injection 4 mg (4 mg Intravenous Given 10/30/19 2107)  HYDROcodone-acetaminophen (NORCO/VICODIN) 5-325 MG per tablet 1 tablet (1 tablet Oral Given 10/30/19 2333)    ED Course  I have reviewed the triage vital signs and the nursing notes.  Pertinent labs & imaging results that were available during my care of the patient were reviewed by me and considered in my medical decision making (see chart for details).    MDM Rules/Calculators/A&P                      Patient with right flank and lower abdominal pain.  Has 4 mm distal stone.  History of kidney stones.  States she is scheduled to see urology later this week.  States she is due to get a CT scan but likely will not need that now.  Pain much improved after treatment.  Will discharge with pain medicines and some nausea medicine.  Reviewed previous EKGs for QT prolongation.  Urine does not show infection.  Hopefully the 4 mm stone will be able to pass since it is so distal.  Have follow-up with [facility]. Final Clinical Impression(s) / ED Diagnoses Final diagnoses:  Right ureteral stone    Rx / DC Orders ED Discharge Orders         Ordered    HYDROcodone-acetaminophen (NORCO/VICODIN) 5-325 MG tablet  Every 6 hours PRN     10/30/19 2341    ondansetron (ZOFRAN-ODT) 4 MG disintegrating tablet  Every 8 hours PRN     10/30/19 2341           Davonna Belling, MD 10/31/19 [phone]

## 2019-11-04 ENCOUNTER — Encounter (HOSPITAL_BASED_OUTPATIENT_CLINIC_OR_DEPARTMENT_OTHER): Payer: Medicare Other | Attending: Internal Medicine | Admitting: Physician Assistant

## 2019-11-04 ENCOUNTER — Encounter: Payer: Self-pay | Admitting: Nurse Practitioner

## 2019-11-04 ENCOUNTER — Other Ambulatory Visit: Payer: Self-pay

## 2019-11-04 ENCOUNTER — Ambulatory Visit (INDEPENDENT_AMBULATORY_CARE_PROVIDER_SITE_OTHER): Payer: Medicare Other | Admitting: Nurse Practitioner

## 2019-11-04 VITALS — BP 120/68 | HR 64 | Ht 64.0 in | Wt 150.0 lb

## 2019-11-04 DIAGNOSIS — I5032 Chronic diastolic (congestive) heart failure: Secondary | ICD-10-CM | POA: Diagnosis not present

## 2019-11-04 DIAGNOSIS — Z951 Presence of aortocoronary bypass graft: Secondary | ICD-10-CM | POA: Insufficient documentation

## 2019-11-04 DIAGNOSIS — J45909 Unspecified asthma, uncomplicated: Secondary | ICD-10-CM | POA: Diagnosis not present

## 2019-11-04 DIAGNOSIS — Z853 Personal history of malignant neoplasm of breast: Secondary | ICD-10-CM | POA: Diagnosis not present

## 2019-11-04 DIAGNOSIS — Z953 Presence of xenogenic heart valve: Secondary | ICD-10-CM | POA: Diagnosis not present

## 2019-11-04 DIAGNOSIS — I2510 Atherosclerotic heart disease of native coronary artery without angina pectoris: Secondary | ICD-10-CM

## 2019-11-04 DIAGNOSIS — I110 Hypertensive heart disease with heart failure: Secondary | ICD-10-CM | POA: Insufficient documentation

## 2019-11-04 DIAGNOSIS — I480 Paroxysmal atrial fibrillation: Secondary | ICD-10-CM | POA: Insufficient documentation

## 2019-11-04 DIAGNOSIS — E119 Type 2 diabetes mellitus without complications: Secondary | ICD-10-CM | POA: Insufficient documentation

## 2019-11-04 DIAGNOSIS — J90 Pleural effusion, not elsewhere classified: Secondary | ICD-10-CM | POA: Diagnosis not present

## 2019-11-04 DIAGNOSIS — L89319 Pressure ulcer of right buttock, unspecified stage: Secondary | ICD-10-CM | POA: Diagnosis present

## 2019-11-04 DIAGNOSIS — E11622 Type 2 diabetes mellitus with other skin ulcer: Secondary | ICD-10-CM | POA: Diagnosis not present

## 2019-11-04 DIAGNOSIS — Z952 Presence of prosthetic heart valve: Secondary | ICD-10-CM

## 2019-11-04 DIAGNOSIS — L89313 Pressure ulcer of right buttock, stage 3: Secondary | ICD-10-CM | POA: Diagnosis not present

## 2019-11-04 DIAGNOSIS — Z79899 Other long term (current) drug therapy: Secondary | ICD-10-CM | POA: Diagnosis not present

## 2019-11-04 MED ORDER — ISOSORBIDE MONONITRATE ER 30 MG PO TB24
30.0000 mg | ORAL_TABLET | Freq: Every day | ORAL | 3 refills | Status: DC
Start: 2019-11-04 — End: 2020-01-25

## 2019-11-04 NOTE — Progress Notes (Signed)
Patient Care Team: [name], DO as PCP - General [name], NP as PCP - Cardiology (Nurse Practitioner) Evans Lance, MD as PCP - Electrophysiology (Cardiology) Alphonsa Overall, MD as Consulting Physician (General Surgery) Nicholas Lose, MD as Consulting Physician (Hematology and Oncology) Gery Pray, MD as Consulting Physician (Radiation Oncology) Evans Lance, MD as Consulting Physician (Cardiology) Collene Gobble, MD as Consulting Physician (Pulmonary Disease) Elayne Snare, MD as Consulting Physician (Endocrinology) Delice Bison, Charlestine Massed, NP as Nurse Practitioner (Hematology and Oncology) Ardelle Balls., MD (Neurology) Izora Gala, MD as Consulting Physician (Otolaryngology) Trula Slade, DPM as Consulting Physician (Podiatry)  DIAGNOSIS:    ICD-10-CM   1. Malignant neoplasm of upper-inner quadrant of right breast in female, estrogen receptor positive ([place])  C50.211    Z17.0     SUMMARY OF ONCOLOGIC HISTORY: Oncology History  Breast cancer of upper-inner quadrant of right female breast ([place])  12/14/2015 Initial Diagnosis   Right breast biopsy 12:30 position: 2 masses, 1.6 cm mass: Invasive ductal carcinoma, grade 2, ER 0%, PR 0%, HER-2 negative, Ki-67 70%; satellite mass 8 mm: IDC grade 2 ER 5%, PR 5%, HER-2 negative, Ki-67 50%; T1cN0 stage IA clinical stage   01/18/2016 Surgery   Right lumpectomy: Multifocal IDC grade 3, 1.9 cm  ER 0%, PR 0%, HER-2 negative, Ki-67 70% and 0.8 cm satellite mass ER 5%, PR 2%, HER-2 negative, Ki-67 50%, high-grade DCIS, margins negative, 0/1 lymph nodes negative T1 cN0 stage IA   02/17/2016 - 04/06/2016 Chemotherapy   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 with above-mentioned history of right breast cancer treated with lumpectomy,adjuvant chemotherapy,radiation, and whois currently on antiestrogen therapywith exemestane.Shealsohas a history of anemiaandis currently receiving Retacritinjectionsand IV iron.Labs on 10/30/19 showed Hg 9.7, HCT 33.3, platelets 160. Shepresents to the clinic today forfollow-up.  ALLERGIES:  is allergic to amoxicillin; tape; aldactone [spironolactone]; imdur [isosorbide dinitrate]; arimidex [anastrozole]; latex; and tetracycline.  MEDICATIONS:  Current Outpatient Medications  Medication Sig Dispense Refill  . ACCU-CHEK GUIDE test strip Use to test blood sugar 3 times daily 150 strip 3  . acetaminophen (TYLENOL) 325 MG tablet Take 2 tablets (650 mg total) by mouth every 6 (six) hours as needed for mild pain (or Fever >/= 101).    Marland Kitchen albuterol (VENTOLIN HFA) 108 (90 Base) MCG/ACT inhaler Inhale 2 puffs into the lungs every 6 (six) hours as needed for wheezing or shortness of breath. 18 g 5  . amiodarone (PACERONE) 200 MG tablet Take 1 tablet (200 mg total) by mouth daily. 90 tablet 3  . Blood Glucose Monitoring Suppl (ACCU-CHEK GUIDE ME) w/Device KIT Use accu chek meter to check blood sugar three times daily. 1 kit 0  . budesonide-formoterol (SYMBICORT) 160-4.5 MCG/ACT inhaler Inhale 2 puffs into the lungs 2 (two) times daily. 1 Inhaler 5  . Dulaglutide (TRULICITY) 1.5 YT/0.3TW SOPN Inject 1.5 mg into the skin once a week. 6 mL 1  .  ELIQUIS 5 MG TABS tablet Take 1 tablet (5 mg total) by mouth 2 (two) times daily. 180 tablet 1  . epoetin alfa-epbx (RETACRIT) 21308 UNIT/ML injection Inject 40,000 Units into the skin  every Thursday.     Marland Kitchen exemestane (AROMASIN) 25 MG tablet TAKE ONE TABLET BY MOUTH DAILY AFTER BREAKFAST 90 tablet 3  . ezetimibe (ZETIA) 10 MG tablet TAKE ONE TABLET BY MOUTH EVERY DAY 90 tablet 3  . famotidine (PEPCID) 20 MG tablet Take 20 mg by mouth 2 (two) times daily.    . furosemide (LASIX) 20 MG tablet Take 20 mg by mouth daily as needed.    Marland Kitchen HYDROcodone-acetaminophen (NORCO/VICODIN) 5-325 MG tablet Take 1-2 tablets by mouth every 6 (six) hours as needed. 10 tablet 0  . insulin glargine (LANTUS) 100 UNIT/ML injection Inject 18 Units into the skin daily.    . Insulin Lispro (HUMALOG KWIKPEN [place]) Inject 4-6 Units into the skin 3 (three) times daily.    . Insulin Pen Needle (BD PEN NEEDLE NANO U/F) 32G X 4 MM MISC USE AS INSTRUCTED TO INJECT INSULIN 4 TIMES DAILY. 360 each 1  . isosorbide mononitrate (IMDUR) 30 MG 24 hr tablet Take 1 tablet (30 mg total) by mouth daily. 30 tablet 3  . LANTUS SOLOSTAR 100 UNIT/ML Solostar Pen INJECT SUBCUTANEOUSLY 16  UNITS DAILY IN THE MORNING 15 mL 3  . loratadine (CLARITIN) 10 MG tablet Take 10 mg by mouth daily.     . metoprolol tartrate (LOPRESSOR) 25 MG tablet Take 1 tablet (25 mg total) by mouth 2 (two) times daily. 180 tablet 3  . midodrine (PROAMATINE) 2.5 MG tablet Take 2.5 mg by mouth 3 (three) times daily with meals.    . nitroGLYCERIN (NITROSTAT) 0.4 MG SL tablet Place 1 tablet (0.4 mg total) under the tongue every 5 (five) minutes as needed for chest pain. 30 tablet 3  . ondansetron (ZOFRAN-ODT) 4 MG disintegrating tablet Take 1 tablet (4 mg total) by mouth every 8 (eight) hours as needed for nausea or vomiting. 6 tablet 0  . rosuvastatin (CRESTOR) 40 MG tablet Take 1 tablet (40 mg total) by mouth at bedtime. 90 tablet 3  . zolpidem (AMBIEN) 5 MG tablet Take 5 mg by mouth at bedtime.      No current facility-administered medications for this visit.    PHYSICAL EXAMINATION: ECOG PERFORMANCE STATUS: 1 - Symptomatic but completely  ambulatory  Vitals:   11/05/19 1411  BP: (!) 120/54  Pulse: 66  Resp: 18  Temp: 98.5 F (36.9 C)  SpO2: 100%   Filed Weights   11/05/19 1411  Weight: 149 lb (67.6 kg)    LABORATORY DATA:  I have reviewed the data as listed CMP Latest Ref Rng & Units 10/30/2019 09/22/2019 09/15/2019  Glucose 70 - 99 mg/dL 184(H) 206(H) 150(H)  BUN 8 - 23 mg/dL 42(H) 36(H) 36(H)  Creatinine 0.44 - 1.00 mg/dL 2.59(H) 2.07(H) 2.30(H)  Sodium 135 - 145 mmol/L 139 142 138  Potassium 3.5 - 5.1 mmol/L 3.6 5.1 5.6(H)  Chloride 98 - 111 mmol/L 100 110(H) 108(H)  CO2 22 - 32 mmol/L 24 21 19(L)  Calcium 8.9 - 10.3 mg/dL 8.7(L) 8.9 8.6(L)  Total Protein 6.5 - 8.1 g/dL 6.9 - -  Total Bilirubin 0.3 - 1.2 mg/dL 0.9 - -  Alkaline Phos 38 - 126 U/L 63 - -  AST 15 - 41 U/L 41 - -  ALT 0 - 44 U/L 31 - -    Lab Results  Component Value Date   WBC 6.1 11/05/2019   HGB 7.7 (L) 11/05/2019   HCT 27.7 (L) 11/05/2019   MCV 94.5 11/05/2019   PLT 141 (L) 11/05/2019   NEUTROABS 4.4 11/05/2019    ASSESSMENT & PLAN:  Breast cancer of upper-inner quadrant of right female breast ([place]) Patient has had longstanding anemia where the hemoglobin was between 10 to 12 g. Since April 2020 her hemoglobin has gotten worse During the same timeframe her creatinine got from 1.5-2.44  Current treatment:Retacritinjectionsweeklystarted7/31/2020 Bone marrow biopsy 04/08/2019: Normocellular bone marrow with trilineage hematopoiesis, absent iron stores, flow cytometry no abnormal B or T-cell clonal abnormalities noted.   IV iron therapy:Venofer weekly x4 04/24/19-05/15/19 Patient also has congestive heart failure and pleural effusions required Pleurx catheter placement.  Lab review:Hemoglobin 7.7  Kidney stones: she lost significant amount of blood. Patient had a small kidney stone was in the emergency room 6 days ago when her hemoglobin was 9.7.  Today it is 7.7.  She obviously feels extremely tired and weak and short of  breath.  We will continue her weekly injections. I will request a pharmacist to see if Retacrit can be changed to Aranesp. We will also check iron studies with the next labs to see if he has iron overload issues.  Return to clinic weekly for Retacrit injections and in 1 month for follow-up with me.    No orders of the defined types were placed in this encounter.  The patient has a good understanding of the overall plan. she agrees with it. she will call with any problems that may develop before the next visit here.  Total time spent: 30 mins including face to face time and time spent for planning, charting and coordination of care  Nicholas Lose, MD 11/05/2019  I, Cloyde Reams Dorshimer, am acting as scribe for Dr. Nicholas Lose.  I have reviewed the above documentation for accuracy and completeness, and I agree with the above.

## 2019-11-04 NOTE — Patient Instructions (Addendum)
After Visit Summary:  We will be checking the following labs today - NONE   Medication Instructions:    Continue with your current medicines. BUT  I am increasing the Imdur to 30 mg (whole tablet) daily  I want you to increase the lasix to 20 mg one day, alternate with 40 mg the following - do this for one week. Then let me know how you are doing.    If you need a refill on your cardiac medications before your next appointment, please call your pharmacy.     Testing/Procedures To Be Arranged:  N/A  Follow-Up:   See me in one month    At [facility], you and your health needs are our priority.  As part of our continuing mission to provide you with exceptional heart care, we have created designated Provider Care Teams.  These Care Teams include your primary Cardiologist (physician) and Advanced Practice Providers (APPs -  Physician Assistants and Nurse Practitioners) who all work together to provide you with the care you need, when you need it.  Special Instructions:  . Stay safe, stay home, wash your hands for at least 20 seconds and wear a mask when out in public.  . It was good to talk with you today. . Send me an update next week about how your weight and swelling is doing.     Call the [place] office at [phone] if you have any questions, problems or concerns.

## 2019-11-04 NOTE — Progress Notes (Addendum)
JOSE, ALLEYNE ([id number]) Visit Report for 11/04/2019 Chief Complaint Document Details Patient Name: Date of Service: Hannah [name], Hannah [name]. 11/04/2019 8:15 A M Medical Record Number: [id number] Patient Account Number: [id number] Date of Birth/Sex: Treating RN: 12/04/1945 (74 y.o. Elam Dutch Primary Care Provider: Patria Mane Other Clinician: Referring Provider: Treating Provider/Extender: Berton Lan, A I[place] Coach in Treatment: 13 Information Obtained from: Patient Chief Complaint 08/04/2019; patient is here for review of a pressure ulcer on her right buttock Electronic Signature(s) Signed: 11/04/2019 8:21:59 AM By: Worthy Keeler PA-C Entered By: Worthy Keeler on 11/04/2019 08:21:58 -------------------------------------------------------------------------------- HPI Details Patient Name: Date of Service: Hannah Duff M. 11/04/2019 8:15 A M Medical Record Number: [id number] Patient Account Number: [id number] Date of Birth/Sex: Treating RN: 28-Aug-1945 (74 y.o. Elam Dutch Primary Care Provider: Patria Mane Other Clinician: Referring Provider: Treating Provider/Extender: Berton Lan, A Iona Coach in Treatment: 13 History of Present Illness HPI Description: ADMISSION 08/04/2019 This is a pleasant 74 year old woman who is accompanied by her husband. She has had a lot of ongoing medical issues recently some of which are related to right breast cancer status post right lumpectomy and chemotherapy. She also had a right pleural effusion and had a wound VAC placed but is now status post pleurodesis on 06/26/2019. She also has had iron deficiency anemia I think of uncertain etiology but she has been receiving IV iron at hematology. About a month ago she noted pain on her upper right buttock. She had her husband finally have a look at this and she had a pressure ulcer. She went to see her primary doctor who told her to keep this covered and  dry. Past medical history; type 2 diabetes, coronary artery disease status post CABG in 1998 with a redo in 2009 she has had a bioprosthetic prosthetic aortic valve replacement, paroxysmal atrial fibrillation, diastolic heart failure, right breast cancer status post lumpectomy and chemotherapy, residual seroma in the right breast, right pleural effusion status post drain and pleurodesis, iron deficiency anemia, hypertension and orthostatic hypotension, asthma, nephrolithiasis 3/2; the patient readmitted to the clinic last week. She has a small but punched out wound in the upper right buttock and close proximity to the gluteal cleft. We use silver collagen. She has home health and she has a daughter-in-law who is an LPN is changing this as well every second day. 3/9; the patient was admitted to the clinic 2 weeks ago. She had a small pressure ulcer on the upper right buttock. We have been using silver collagen. Unfortunately she comes back in with very adherent debris over the surface of the wound 3/16; small wound on the right buttock. Absolutely no better. Changed to Iodoflex last time still the same adherent debris 3/23; small wound on the right buttock somewhat smaller but still with an inherent fibrinous surface. We have been using Iodoflex. 4/6; small wound on the right buttock. Absolutely no change. She still has the same fibrinous base to this which is very difficult to debride. We have been using Iodoflex, I will change her to Santyl change daily. 4/20; small wound on the right buttock. We prescribed Santyl last time. She arrives today with a surface over the wound area although I hesitate to see this is healed. She is not complaining of pain 11/04/2019 upon evaluation today patient actually appears to be doing excellent in regard to her wound. In fact this appears to be completely healed which  is great news. I am extremely pleased with where things stand. Electronic Signature(s) Signed:  11/04/2019 9:21:49 AM By: Worthy Keeler PA-C Entered By: Worthy Keeler on 11/04/2019 09:21:49 -------------------------------------------------------------------------------- Physical Exam Details Patient Name: Date of Service: Hannah [name], Hannah [name] 11/04/2019 8:15 A M Medical Record Number: [id number] Patient Account Number: [id number] Date of Birth/Sex: Treating RN: [birth year] (74 y.o. Elam Dutch Primary Care Provider: Patria Mane Other Clinician: Referring Provider: Treating Provider/Extender: Jillyn Hidden KLE, A USTIN Weeks in Treatment: 92 Constitutional Well-nourished and well-hydrated in no acute distress. Respiratory normal breathing without difficulty. Psychiatric this patient is able to make decisions and demonstrates good insight into disease process. Alert and Oriented x 3. pleasant and cooperative. Notes His wound again showed signs of complete epithelization there does appear to be a small area of scar tissue that probably will not change much so it could lessen over time. Nonetheless this is the reason that I would watch to make sure nothing reopens or cause her any trouble. There is no sign of infection or abscess. Electronic Signature(s) Signed: 11/04/2019 9:22:07 AM By: Worthy Keeler PA-C Entered By: Worthy Keeler on 11/04/2019 09:22:07 -------------------------------------------------------------------------------- Physician Orders Details Patient Name: Date of Service: Hannah Duff M. 11/04/2019 8:15 A M Medical Record Number: [id number] Patient Account Number: [id number] Date of Birth/Sex: Treating RN: [birth year] (74 y.o. Elam Dutch Primary Care Provider: Patria Mane Other Clinician: Referring Provider: Treating Provider/Extender: Jillyn Hidden KLE, A Iona Coach in Treatment: 66 Verbal / Phone Orders: No Diagnosis Coding ICD-10 Coding Code Description L89.313 Pressure ulcer of right buttock, stage 3 C50.211  Malignant neoplasm of upper-inner quadrant of right female breast Discharge From [facility] Services Discharge from [place] Signature(s) Signed: 11/04/2019 1:28:30 PM By: Worthy Keeler PA-C Signed: 11/04/2019 2:12:16 PM By: Baruch Gouty RN, BSN Entered By: Baruch Gouty on 11/04/2019 09:20:07 -------------------------------------------------------------------------------- Problem List Details Patient Name: Date of Service: Hannah Duff M. 11/04/2019 8:15 A M Medical Record Number: [id number] Patient Account Number: [id number] Date of Birth/Sex: Treating RN: Mar 18, 1946 (74 y.o. Elam Dutch Primary Care Provider: Patria Mane Other Clinician: Referring Provider: Treating Provider/Extender: Berton Lan, A Iona Coach in Treatment: 13 Active Problems ICD-10 Encounter Code Description Active Date MDM Diagnosis L89.313 Pressure ulcer of right buttock, stage 3 08/04/2019 No Yes C50.211 Malignant neoplasm of upper-inner quadrant of right female breast 08/04/2019 No Yes Inactive Problems Resolved Problems Electronic Signature(s) Signed: 11/04/2019 8:21:54 AM By: Worthy Keeler PA-C Entered By: Worthy Keeler on 11/04/2019 08:21:54 -------------------------------------------------------------------------------- Progress Note Details Patient Name: Date of Service: Hannah Duff M. 11/04/2019 8:15 A M Medical Record Number: [id number] Patient Account Number: [id number] Date of Birth/Sex: Treating RN: [birth year] (74 y.o. Elam Dutch Primary Care Provider: Patria Mane Other Clinician: Referring Provider: Treating Provider/Extender: Berton Lan, Ivar B[place] in Treatment: 13 Subjective Chief Complaint Information obtained from Patient 08/04/2019; patient is here for review of a pressure ulcer on her right buttock History of Present Illness (HPI) ADMISSION 08/04/2019 This is a pleasant 74 year old woman who is accompanied  by her husband. She has had a lot of ongoing medical issues recently some of which are related to right breast cancer status post right lumpectomy and chemotherapy. She also had a right pleural effusion and had a wound VAC placed but is now status post pleurodesis on 06/26/2019. She also has had iron  deficiency anemia I think of uncertain etiology but she has been receiving IV iron at hematology. About a month ago she noted pain on her upper right buttock. She had her husband finally have a look at this and she had a pressure ulcer. She went to see her primary doctor who told her to keep this covered and dry. Past medical history; type 2 diabetes, coronary artery disease status post CABG in 1998 with a redo in 2009 she has had a bioprosthetic prosthetic aortic valve replacement, paroxysmal atrial fibrillation, diastolic heart failure, right breast cancer status post lumpectomy and chemotherapy, residual seroma in the right breast, right pleural effusion status post drain and pleurodesis, iron deficiency anemia, hypertension and orthostatic hypotension, asthma, nephrolithiasis 3/2; the patient readmitted to the clinic last week. She has a small but punched out wound in the upper right buttock and close proximity to the gluteal cleft. We use silver collagen. She has home health and she has a daughter-in-law who is an LPN is changing this as well every second day. 3/9; the patient was admitted to the clinic 2 weeks ago. She had a small pressure ulcer on the upper right buttock. We have been using silver collagen. Unfortunately she comes back in with very adherent debris over the surface of the wound 3/16; small wound on the right buttock. Absolutely no better. Changed to Iodoflex last time still the same adherent debris 3/23; small wound on the right buttock somewhat smaller but still with an inherent fibrinous surface. We have been using Iodoflex. 4/6; small wound on the right buttock. Absolutely no  change. She still has the same fibrinous base to this which is very difficult to debride. We have been using Iodoflex, I will change her to Santyl change daily. 4/20; small wound on the right buttock. We prescribed Santyl last time. She arrives today with a surface over the wound area although I hesitate to see this is healed. She is not complaining of pain 11/04/2019 upon evaluation today patient actually appears to be doing excellent in regard to her wound. In fact this appears to be completely healed which is great news. I am extremely pleased with where things stand. Objective Constitutional Well-nourished and well-hydrated in no acute distress. Vitals Time Taken: 8:11 AM, Height: 64 in, Weight: 145 lbs, BMI: 24.9, Temperature: 98.4 F, Pulse: 64 bpm, Respiratory Rate: 18 breaths/min, Blood Pressure: 106/78 mmHg. Respiratory normal breathing without difficulty. Psychiatric this patient is able to make decisions and demonstrates good insight into disease process. Alert and Oriented x 3. pleasant and cooperative. General Notes: His wound again showed signs of complete epithelization there does appear to be a small area of scar tissue that probably will not change much so it could lessen over time. Nonetheless this is the reason that I would watch to make sure nothing reopens or cause her any trouble. There is no sign of infection or abscess. Assessment Active Problems ICD-10 Pressure ulcer of right buttock, stage 3 Malignant neoplasm of upper-inner quadrant of right female breast Plan Discharge From [facility] Services: Discharge from [place] 1. I would recommend currently that we discontinue wound care services I do not believe that she needs to have any dressing over this area which is good news. 2. I am also can recommend at this time that we go ahead and have the patient continue with appropriate offloading to avoid any damage to the heel area and she states that she is trying  to be extremely careful as far  as this is concerned which I think is appropriate as well. We will see her back for a follow-up visit as needed. Electronic Signature(s) Signed: 11/04/2019 9:22:46 AM By: Worthy Keeler PA-C Entered By: Worthy Keeler on 11/04/2019 09:22:45 -------------------------------------------------------------------------------- SuperBill Details Patient Name: Date of Service: Hannah Duff M. 11/04/2019 Medical Record Number: [id number] Patient Account Number: [id number] Date of Birth/Sex: Treating RN: [birth year] (74 y.o. Elam Dutch Primary Care Provider: Patria Mane Other Clinician: Referring Provider: Treating Provider/Extender: Jillyn Hidden KLE, A Iona Coach in Treatment: 13 Diagnosis Coding ICD-10 Codes Code Description X10.626 Pressure ulcer of right buttock, stage 3 C50.211 Malignant neoplasm of upper-inner quadrant of right female breast Facility Procedures CPT4 Code: [id number] Description: [phone] - WOUND CARE VISIT-LEV 2 EST PT Modifier: Quantity: 1 Physician Procedures : CPT4 Code Description Modifier [id number] - WC PHYS LEVEL 3 - EST PT ICD-10 Diagnosis Description L89.313 Pressure ulcer of right buttock, stage 3 C50.211 Malignant neoplasm of upper-inner quadrant of right female breast Quantity: 1 Electronic Signature(s) Signed: 11/04/2019 9:23:23 AM By: Worthy Keeler PA-C Entered By: Worthy Keeler on 11/04/2019 96:78:93

## 2019-11-05 ENCOUNTER — Ambulatory Visit: Payer: Medicare Other

## 2019-11-05 ENCOUNTER — Other Ambulatory Visit: Payer: Medicare Other

## 2019-11-05 ENCOUNTER — Inpatient Hospital Stay (HOSPITAL_BASED_OUTPATIENT_CLINIC_OR_DEPARTMENT_OTHER): Payer: Medicare Other | Admitting: Hematology and Oncology

## 2019-11-05 ENCOUNTER — Ambulatory Visit (HOSPITAL_BASED_OUTPATIENT_CLINIC_OR_DEPARTMENT_OTHER): Payer: Medicare Other | Admitting: Internal Medicine

## 2019-11-05 ENCOUNTER — Inpatient Hospital Stay: Payer: Medicare Other

## 2019-11-05 VITALS — BP 120/54 | HR 66 | Temp 98.5°F | Resp 18 | Ht 64.0 in | Wt 149.0 lb

## 2019-11-05 DIAGNOSIS — D638 Anemia in other chronic diseases classified elsewhere: Secondary | ICD-10-CM | POA: Diagnosis not present

## 2019-11-05 DIAGNOSIS — C50211 Malignant neoplasm of upper-inner quadrant of right female breast: Secondary | ICD-10-CM

## 2019-11-05 DIAGNOSIS — Z170 Estrogen receptor positive status [ER+]: Secondary | ICD-10-CM

## 2019-11-05 DIAGNOSIS — D509 Iron deficiency anemia, unspecified: Secondary | ICD-10-CM

## 2019-11-05 DIAGNOSIS — I129 Hypertensive chronic kidney disease with stage 1 through stage 4 chronic kidney disease, or unspecified chronic kidney disease: Secondary | ICD-10-CM | POA: Diagnosis not present

## 2019-11-05 LAB — CBC WITH DIFFERENTIAL (CANCER CENTER ONLY)
Abs Immature Granulocytes: 0.02 10*3/uL (ref 0.00–0.07)
Basophils Absolute: 0.1 10*3/uL (ref 0.0–0.1)
Basophils Relative: 1 %
Eosinophils Absolute: 0.2 10*3/uL (ref 0.0–0.5)
Eosinophils Relative: 3 %
HCT: 27.7 % — ABNORMAL LOW (ref 36.0–46.0)
Hemoglobin: 7.7 g/dL — ABNORMAL LOW (ref 12.0–15.0)
Immature Granulocytes: 0 %
Lymphocytes Relative: 10 %
Lymphs Abs: 0.6 10*3/uL — ABNORMAL LOW (ref 0.7–4.0)
MCH: 26.3 pg (ref 26.0–34.0)
MCHC: 27.8 g/dL — ABNORMAL LOW (ref 30.0–36.0)
MCV: 94.5 fL (ref 80.0–100.0)
Monocytes Absolute: 0.8 10*3/uL (ref 0.1–1.0)
Monocytes Relative: 14 %
Neutro Abs: 4.4 10*3/uL (ref 1.7–7.7)
Neutrophils Relative %: 72 %
Platelet Count: 141 10*3/uL — ABNORMAL LOW (ref 150–400)
RBC: 2.93 MIL/uL — ABNORMAL LOW (ref 3.87–5.11)
RDW: 16.1 % — ABNORMAL HIGH (ref 11.5–15.5)
WBC Count: 6.1 10*3/uL (ref 4.0–10.5)
nRBC: 0 % (ref 0.0–0.2)

## 2019-11-05 LAB — SAMPLE TO BLOOD BANK

## 2019-11-05 MED ORDER — EPOETIN ALFA-EPBX 40000 UNIT/ML IJ SOLN
INTRAMUSCULAR | Status: AC
  Filled 2019-11-05: qty 1

## 2019-11-05 MED ORDER — EPOETIN ALFA-EPBX 40000 UNIT/ML IJ SOLN
40000.0000 [IU] | Freq: Once | INTRAMUSCULAR | Status: AC
  Administered 2019-11-05: 40000 [IU] via SUBCUTANEOUS

## 2019-11-05 NOTE — Patient Instructions (Signed)

## 2019-11-05 NOTE — Assessment & Plan Note (Signed)
Patient has had longstanding anemia where the hemoglobin was between 10 to 12 g. Since April 2020 her hemoglobin has gotten worse During the same timeframe her creatinine got from 1.5-2.44  Current treatment:Retacritinjectionsweeklystarted7/31/2020 Bone marrow biopsy 04/08/2019: Normocellular bone marrow with trilineage hematopoiesis, absent iron stores, flow cytometry no abnormal B or T-cell clonal abnormalities noted.   IV iron therapy:Venofer weekly x4 04/24/19-05/15/19 Patient also has congestive heart failure and pleural effusions required Pleurx catheter placement. Lab review:Hemoglobin   Kidney stones: she lost significant amount of blood.  We will continue her weekly injections. Return to clinic weekly for Retacrit injections and in 1 month for follow-up with me. 

## 2019-11-06 ENCOUNTER — Other Ambulatory Visit: Payer: Self-pay | Admitting: Adult Health

## 2019-11-06 ENCOUNTER — Telehealth: Payer: Self-pay

## 2019-11-06 DIAGNOSIS — D649 Anemia, unspecified: Secondary | ICD-10-CM

## 2019-11-06 NOTE — Progress Notes (Signed)
CLOTEE, SCHLICKER ([id number]) Visit Report for 11/04/2019 Arrival Information Details Patient Name: Date of Service: Hannah [name], Hannah [name]. 11/04/2019 8:15 A M Medical Record Number: [id number] Patient Account Number: [id number] Date of Birth/Sex: Treating RN: 05-07-1946 (74 y.o. Orvan Falconer Primary Care Krishauna Schatzman: Patria Mane Other Clinician: Referring Galit Urich: Treating Delorice Bannister/Extender: Jillyn Hidden KLE, A [place] Coach in Treatment: 13 Visit Information History Since Last Visit All ordered tests and consults were completed: No Patient Arrived: Ambulatory Added or deleted any medications: No Arrival Time: 08:08 Any new allergies or adverse reactions: No Accompanied By: husband Had a fall or experienced change in No Transfer Assistance: None activities of daily living that may affect Patient Identification Verified: Yes risk of falls: Secondary Verification Process Completed: Yes Signs or symptoms of abuse/neglect since last visito No Patient Requires Transmission-Based Precautions: No Hospitalized since last visit: No Patient Has Alerts: No Implantable device outside of the clinic excluding No cellular tissue based products placed in the center since last visit: Pain Present Now: No Electronic Signature(s) Signed: 11/06/2019 9:26:18 AM By: Carlene Coria RN Entered By: Carlene Coria on 11/04/2019 08:11:21 -------------------------------------------------------------------------------- Clinic Level of Care Assessment Details Patient Name: Date of Service: Hannah [name], Hannah [name]. 11/04/2019 8:15 A M Medical Record Number: [id number] Patient Account Number: [id number] Date of Birth/Sex: Treating RN: December 02, 1945 (74 y.o. Elam Dutch Primary Care Zamyra Allensworth: Patria Mane Other Clinician: Referring Natthew Marlatt: Treating Karee Christopherson/Extender: Jillyn Hidden KLE, A [place] Coach in Treatment: 13 Clinic Level of Care Assessment Items TOOL 4 Quantity Score []  - 0 Use when  only an EandM is performed on FOLLOW-UP visit ASSESSMENTS - Nursing Assessment / Reassessment X- 1 10 Reassessment of Co-morbidities (includes updates in patient status) X- 1 5 Reassessment of Adherence to Treatment Plan ASSESSMENTS - Wound and Skin A ssessment / Reassessment X - Simple Wound Assessment / Reassessment - one wound 1 5 []  - 0 Complex Wound Assessment / Reassessment - multiple wounds []  - 0 Dermatologic / Skin Assessment (not related to wound area) ASSESSMENTS - Focused Assessment []  - 0 Circumferential Edema Measurements - multi extremities []  - 0 Nutritional Assessment / Counseling / Intervention []  - 0 Lower Extremity Assessment (monofilament, tuning fork, pulses) []  - 0 Peripheral Arterial Disease Assessment (using hand held doppler) ASSESSMENTS - Ostomy and/or Continence Assessment and Care []  - 0 Incontinence Assessment and Management []  - 0 Ostomy Care Assessment and Management (repouching, etc.) PROCESS - Coordination of Care X - Simple Patient / Family Education for ongoing care 1 15 []  - 0 Complex (extensive) Patient / Family Education for ongoing care X- 1 10 Staff obtains Programmer, systems, Records, T Results / Process Orders est []  - 0 Staff telephones HHA, Nursing Homes / Clarify orders / etc []  - 0 Routine Transfer to another Facility (non-emergent condition) []  - 0 Routine Hospital Admission (non-emergent condition) []  - 0 New Admissions / Biomedical engineer / Ordering NPWT Apligraf, etc. , []  - 0 Emergency Hospital Admission (emergent condition) X- 1 10 Simple Discharge Coordination []  - 0 Complex (extensive) Discharge Coordination PROCESS - Special Needs []  - 0 Pediatric / Minor Patient Management []  - 0 Isolation Patient Management []  - 0 Hearing / Language / Visual special needs []  - 0 Assessment of Community assistance (transportation, D/C planning, etc.) []  - 0 Additional assistance / Altered mentation []  - 0 Support  Surface(s) Assessment (bed, cushion, seat, etc.) INTERVENTIONS - Wound Cleansing / Measurement []  - 0 Simple Wound Cleansing -  one wound []  - 0 Complex Wound Cleansing - multiple wounds []  - 0 Wound Imaging (photographs - any number of wounds) []  - 0 Wound Tracing (instead of photographs) []  - 0 Simple Wound Measurement - one wound []  - 0 Complex Wound Measurement - multiple wounds INTERVENTIONS - Wound Dressings []  - 0 Small Wound Dressing one or multiple wounds []  - 0 Medium Wound Dressing one or multiple wounds []  - 0 Large Wound Dressing one or multiple wounds []  - 0 Application of Medications - topical []  - 0 Application of Medications - injection INTERVENTIONS - Miscellaneous []  - 0 External ear exam []  - 0 Specimen Collection (cultures, biopsies, blood, body fluids, etc.) []  - 0 Specimen(s) / Culture(s) sent or taken to Lab for analysis []  - 0 Patient Transfer (multiple staff / Civil Service fast streamer / Similar devices) []  - 0 Simple Staple / Suture removal (25 or less) []  - 0 Complex Staple / Suture removal (26 or more) []  - 0 Hypo / Hyperglycemic Management (close monitor of Blood Glucose) []  - 0 Ankle / Brachial Index (ABI) - do not check if billed separately X- 1 5 Vital Signs Has the patient been seen at the hospital within the last three years: Yes Total Score: 60 Level Of Care: New/Established - Level 2 Electronic Signature(s) Signed: 11/04/2019 2:12:16 PM By: Baruch Gouty RN, BSN Entered By: Baruch Gouty on 11/04/2019 09:20:57 -------------------------------------------------------------------------------- Encounter Discharge Information Details Patient Name: Date of Service: Hannah Duff M. 11/04/2019 8:15 A M Medical Record Number: [id number] Patient Account Number: [id number] Date of Birth/Sex: Treating RN: [birth year] (74 y.o. Elam Dutch Primary Care Kais Monje: Patria Mane Other Clinician: Referring Ece Cumberland: Treating Titiana Severa/Extender:  Jillyn Hidden KLE, A [place] Coach in Treatment: 13 Encounter Discharge Information Items Discharge Condition: Stable Ambulatory Status: Ambulatory Discharge Destination: Home Transportation: Private Auto Accompanied By: self Schedule Follow-up Appointment: Yes Clinical Summary of Care: Patient Declined Electronic Signature(s) Signed: 11/04/2019 2:12:16 PM By: Baruch Gouty RN, BSN Entered By: Baruch Gouty on 11/04/2019 09:21:46 -------------------------------------------------------------------------------- [place] Details Patient Name: Date of Service: Hannah Duff M. 11/04/2019 8:15 A M Medical Record Number: [id number] Patient Account Number: [id number] Date of Birth/Sex: Treating RN: 1945-12-24 (74 y.o. Elam Dutch Primary Care Saory Carriero: Patria Mane Other Clinician: Referring Marayah Higdon: Treating Jerzee Jerome/Extender: Berton Lan, A [place] Coach in Treatment: 13 Active Inactive Electronic Signature(s) Signed: 11/04/2019 2:12:16 PM By: Baruch Gouty RN, BSN Entered By: Baruch Gouty on 11/04/2019 08:53:43 -------------------------------------------------------------------------------- Pain Assessment Details Patient Name: Date of Service: Hannah Duff M. 11/04/2019 8:15 A M Medical Record Number: [id number] Patient Account Number: [id number] Date of Birth/Sex: Treating RN: April 27, 1946 (74 y.o. Orvan Falconer Primary Care Fuller Makin: Patria Mane Other Clinician: Referring Saniyah Mondesir: Treating Brace Welte/Extender: Jillyn Hidden KLE, A USTIN Weeks in Treatment: 13 Active Problems Location of Pain Severity and Description of Pain Patient Has Paino No Site Locations Pain Management and Medication Current Pain Management: Electronic Signature(s) Signed: 11/06/2019 9:26:18 AM By: Carlene Coria RN Entered By: Carlene Coria on 11/04/2019  08:11:56 -------------------------------------------------------------------------------- Patient/Caregiver Education Details Patient Name: Date of Service: Hannah WERS, Hannah M. 5/26/2021andnbsp8:15 A M Medical Record Number: [id number] Patient Account Number: [id number] Date of Birth/Gender: Treating RN: January 15, 1946 (74 y.o. Elam Dutch Primary Care Physician: Patria Mane Other Clinician: Referring Physician: Treating Physician/Extender: Berton Lan, Ivar Bury in Treatment: 13 Education Assessment Education Provided To: Patient Education Topics Provided Wound/Skin  Impairment: Methods: Explain/Verbal Responses: Reinforcements needed, State content correctly Electronic Signature(s) Signed: 11/04/2019 2:12:16 PM By: Baruch Gouty RN, BSN Entered By: Baruch Gouty on 11/04/2019 08:53:58 -------------------------------------------------------------------------------- [place] Details Patient Name: Date of Service: Hannah Duff M. 11/04/2019 8:15 A M Medical Record Number: [id number] Patient Account Number: [id number] Date of Birth/Sex: Treating RN: 10-11-1945 (74 y.o. Orvan Falconer Primary Care Trevis Eden: Patria Mane Other Clinician: Referring Adam Sanjuan: Treating Loree Shehata/Extender: Jillyn Hidden KLE, A USTIN Weeks in Treatment: 13 Vital Signs Time Taken: 08:11 Temperature (F): 98.4 Height (in): 64 Pulse (bpm): 64 Weight (lbs): 145 Respiratory Rate (breaths/min): 18 Body Mass Index (BMI): 24.9 Blood Pressure (mmHg): 106/78 Reference Range: 80 - 120 mg / dl Electronic Signature(s) Signed: 11/06/2019 9:26:18 AM By: Carlene Coria RN Entered By: Carlene Coria on 11/04/2019 08:11:51

## 2019-11-06 NOTE — Progress Notes (Signed)
Received call from Hinda Lenis, RN noting patient needs blood orders for two units of blood for tomorrow as recommended by Dr. Lindi Adie, and he is out.  Orders placed.  Patient has h/o CHF, so placed order for Lasix in between units.   Wilber Bihari, NP

## 2019-11-06 NOTE — Telephone Encounter (Signed)
Pt called stating she has not yet received a call about blood transfusion per Dr Lindi Adie. Informed pt she has infusion appt 11/07/19 at 0830 for 2 units of blood. Pt verbalized thanks and understanding.

## 2019-11-07 ENCOUNTER — Inpatient Hospital Stay: Payer: Medicare Other

## 2019-11-07 ENCOUNTER — Other Ambulatory Visit: Payer: Self-pay

## 2019-11-07 DIAGNOSIS — I129 Hypertensive chronic kidney disease with stage 1 through stage 4 chronic kidney disease, or unspecified chronic kidney disease: Secondary | ICD-10-CM | POA: Diagnosis not present

## 2019-11-07 DIAGNOSIS — D649 Anemia, unspecified: Secondary | ICD-10-CM

## 2019-11-07 LAB — PREPARE RBC (CROSSMATCH)

## 2019-11-07 MED ORDER — SODIUM CHLORIDE 0.9% IV SOLUTION
250.0000 mL | Freq: Once | INTRAVENOUS | Status: AC
  Administered 2019-11-07: 250 mL via INTRAVENOUS
  Filled 2019-11-07: qty 250

## 2019-11-07 MED ORDER — DIPHENHYDRAMINE HCL 25 MG PO CAPS
25.0000 mg | ORAL_CAPSULE | Freq: Once | ORAL | Status: AC
  Administered 2019-11-07: 25 mg via ORAL

## 2019-11-07 MED ORDER — FUROSEMIDE 10 MG/ML IJ SOLN: 20.0000 mg | Freq: Once | INTRAMUSCULAR | Status: DC

## 2019-11-07 MED ORDER — ACETAMINOPHEN 325 MG PO TABS
650.0000 mg | ORAL_TABLET | Freq: Once | ORAL | Status: AC
  Administered 2019-11-07: 650 mg via ORAL

## 2019-11-07 NOTE — Patient Instructions (Signed)

## 2019-11-08 LAB — BPAM RBC
Blood Product Expiration Date: 202106302359
Blood Product Expiration Date: 202107012359
ISSUE DATE / TIME: 202105290848
ISSUE DATE / TIME: 202105290848
Unit Type and Rh: 5100
Unit Type and Rh: 5100

## 2019-11-08 LAB — TYPE AND SCREEN
ABO/RH(D): O POS
Antibody Screen: NEGATIVE
Unit division: 0
Unit division: 0

## 2019-11-10 ENCOUNTER — Other Ambulatory Visit: Payer: Self-pay | Admitting: Nurse Practitioner

## 2019-11-12 ENCOUNTER — Ambulatory Visit: Payer: Medicare Other

## 2019-11-12 ENCOUNTER — Other Ambulatory Visit: Payer: Medicare Other

## 2019-11-12 ENCOUNTER — Other Ambulatory Visit: Payer: Self-pay

## 2019-11-12 ENCOUNTER — Inpatient Hospital Stay: Payer: Medicare Other | Attending: Hematology and Oncology

## 2019-11-12 ENCOUNTER — Inpatient Hospital Stay: Payer: Medicare Other

## 2019-11-12 VITALS — BP 135/60 | HR 61 | Temp 98.1°F | Resp 18

## 2019-11-12 DIAGNOSIS — I129 Hypertensive chronic kidney disease with stage 1 through stage 4 chronic kidney disease, or unspecified chronic kidney disease: Secondary | ICD-10-CM | POA: Insufficient documentation

## 2019-11-12 DIAGNOSIS — D631 Anemia in chronic kidney disease: Secondary | ICD-10-CM | POA: Insufficient documentation

## 2019-11-12 DIAGNOSIS — Z79899 Other long term (current) drug therapy: Secondary | ICD-10-CM | POA: Diagnosis not present

## 2019-11-12 DIAGNOSIS — D638 Anemia in other chronic diseases classified elsewhere: Secondary | ICD-10-CM

## 2019-11-12 DIAGNOSIS — N189 Chronic kidney disease, unspecified: Secondary | ICD-10-CM | POA: Diagnosis not present

## 2019-11-12 LAB — RETICULOCYTES
Immature Retic Fract: 13.8 % (ref 2.3–15.9)
RBC.: 3.49 MIL/uL — ABNORMAL LOW (ref 3.87–5.11)
Retic Count, Absolute: 79.2 10*3/uL (ref 19.0–186.0)
Retic Ct Pct: 2.3 % (ref 0.4–3.1)

## 2019-11-12 LAB — CBC WITH DIFFERENTIAL (CANCER CENTER ONLY)
Abs Immature Granulocytes: 0.01 10*3/uL (ref 0.00–0.07)
Basophils Absolute: 0.1 10*3/uL (ref 0.0–0.1)
Basophils Relative: 1 %
Eosinophils Absolute: 0.2 10*3/uL (ref 0.0–0.5)
Eosinophils Relative: 3 %
HCT: 33.5 % — ABNORMAL LOW (ref 36.0–46.0)
Hemoglobin: 9.6 g/dL — ABNORMAL LOW (ref 12.0–15.0)
Immature Granulocytes: 0 %
Lymphocytes Relative: 11 %
Lymphs Abs: 0.5 10*3/uL — ABNORMAL LOW (ref 0.7–4.0)
MCH: 27.1 pg (ref 26.0–34.0)
MCHC: 28.7 g/dL — ABNORMAL LOW (ref 30.0–36.0)
MCV: 94.6 fL (ref 80.0–100.0)
Monocytes Absolute: 0.6 10*3/uL (ref 0.1–1.0)
Monocytes Relative: 12 %
Neutro Abs: 3.7 10*3/uL (ref 1.7–7.7)
Neutrophils Relative %: 73 %
Platelet Count: 123 10*3/uL — ABNORMAL LOW (ref 150–400)
RBC: 3.54 MIL/uL — ABNORMAL LOW (ref 3.87–5.11)
RDW: 15.8 % — ABNORMAL HIGH (ref 11.5–15.5)
WBC Count: 5 10*3/uL (ref 4.0–10.5)
nRBC: 0 % (ref 0.0–0.2)

## 2019-11-12 LAB — SAMPLE TO BLOOD BANK

## 2019-11-12 LAB — FERRITIN: Ferritin: 16 ng/mL (ref 11–307)

## 2019-11-12 LAB — IRON AND TIBC
Iron: 26 ug/dL — ABNORMAL LOW (ref 41–142)
Saturation Ratios: 8 % — ABNORMAL LOW (ref 21–57)
TIBC: 325 ug/dL (ref 236–444)
UIBC: 299 ug/dL (ref 120–384)

## 2019-11-12 MED ORDER — EPOETIN ALFA-EPBX 40000 UNIT/ML IJ SOLN
INTRAMUSCULAR | Status: AC
  Filled 2019-11-12: qty 1

## 2019-11-12 MED ORDER — EPOETIN ALFA-EPBX 40000 UNIT/ML IJ SOLN
40000.0000 [IU] | Freq: Once | INTRAMUSCULAR | Status: AC
  Administered 2019-11-12: 40000 [IU] via SUBCUTANEOUS

## 2019-11-12 NOTE — Patient Instructions (Signed)

## 2019-11-17 ENCOUNTER — Other Ambulatory Visit: Payer: Self-pay | Admitting: Hematology and Oncology

## 2019-11-17 ENCOUNTER — Encounter: Payer: Self-pay | Admitting: Hematology and Oncology

## 2019-11-19 ENCOUNTER — Inpatient Hospital Stay: Payer: Medicare Other

## 2019-11-19 ENCOUNTER — Ambulatory Visit: Payer: Medicare Other

## 2019-11-19 ENCOUNTER — Other Ambulatory Visit: Payer: Medicare Other

## 2019-11-19 ENCOUNTER — Other Ambulatory Visit: Payer: Self-pay

## 2019-11-19 VITALS — BP 128/62 | HR 62 | Temp 98.0°F | Resp 18

## 2019-11-19 DIAGNOSIS — D638 Anemia in other chronic diseases classified elsewhere: Secondary | ICD-10-CM

## 2019-11-19 DIAGNOSIS — I129 Hypertensive chronic kidney disease with stage 1 through stage 4 chronic kidney disease, or unspecified chronic kidney disease: Secondary | ICD-10-CM | POA: Diagnosis not present

## 2019-11-19 LAB — CBC WITH DIFFERENTIAL (CANCER CENTER ONLY)
Abs Immature Granulocytes: 0.01 10*3/uL (ref 0.00–0.07)
Basophils Absolute: 0.1 10*3/uL (ref 0.0–0.1)
Basophils Relative: 1 %
Eosinophils Absolute: 0.2 10*3/uL (ref 0.0–0.5)
Eosinophils Relative: 3 %
HCT: 31.4 % — ABNORMAL LOW (ref 36.0–46.0)
Hemoglobin: 9 g/dL — ABNORMAL LOW (ref 12.0–15.0)
Immature Granulocytes: 0 %
Lymphocytes Relative: 11 %
Lymphs Abs: 0.6 10*3/uL — ABNORMAL LOW (ref 0.7–4.0)
MCH: 27.4 pg (ref 26.0–34.0)
MCHC: 28.7 g/dL — ABNORMAL LOW (ref 30.0–36.0)
MCV: 95.4 fL (ref 80.0–100.0)
Monocytes Absolute: 0.7 10*3/uL (ref 0.1–1.0)
Monocytes Relative: 12 %
Neutro Abs: 4.1 10*3/uL (ref 1.7–7.7)
Neutrophils Relative %: 73 %
Platelet Count: 114 10*3/uL — ABNORMAL LOW (ref 150–400)
RBC: 3.29 MIL/uL — ABNORMAL LOW (ref 3.87–5.11)
RDW: 15.9 % — ABNORMAL HIGH (ref 11.5–15.5)
WBC Count: 5.6 10*3/uL (ref 4.0–10.5)
nRBC: 0 % (ref 0.0–0.2)

## 2019-11-19 LAB — SAMPLE TO BLOOD BANK

## 2019-11-19 MED ORDER — DARBEPOETIN ALFA 500 MCG/ML IJ SOSY
PREFILLED_SYRINGE | INTRAMUSCULAR | Status: AC
  Filled 2019-11-19: qty 1

## 2019-11-19 MED ORDER — DARBEPOETIN ALFA 500 MCG/ML IJ SOSY
500.0000 ug | PREFILLED_SYRINGE | Freq: Once | INTRAMUSCULAR | Status: AC
  Administered 2019-11-19: 500 ug via SUBCUTANEOUS

## 2019-11-19 NOTE — Patient Instructions (Signed)

## 2019-11-24 ENCOUNTER — Ambulatory Visit: Payer: Medicare Other | Admitting: Hematology and Oncology

## 2019-11-26 ENCOUNTER — Other Ambulatory Visit: Payer: Medicare Other

## 2019-11-26 ENCOUNTER — Other Ambulatory Visit: Payer: Self-pay

## 2019-11-26 ENCOUNTER — Ambulatory Visit: Payer: Medicare Other

## 2019-11-26 ENCOUNTER — Telehealth: Payer: Self-pay | Admitting: Hematology and Oncology

## 2019-11-26 ENCOUNTER — Inpatient Hospital Stay: Payer: Medicare Other

## 2019-11-26 VITALS — BP 128/72 | HR 62 | Temp 98.2°F | Resp 18

## 2019-11-26 DIAGNOSIS — D638 Anemia in other chronic diseases classified elsewhere: Secondary | ICD-10-CM

## 2019-11-26 DIAGNOSIS — I129 Hypertensive chronic kidney disease with stage 1 through stage 4 chronic kidney disease, or unspecified chronic kidney disease: Secondary | ICD-10-CM | POA: Diagnosis not present

## 2019-11-26 LAB — CBC WITH DIFFERENTIAL (CANCER CENTER ONLY)
Abs Immature Granulocytes: 0.02 10*3/uL (ref 0.00–0.07)
Basophils Absolute: 0.1 10*3/uL (ref 0.0–0.1)
Basophils Relative: 1 %
Eosinophils Absolute: 0.2 10*3/uL (ref 0.0–0.5)
Eosinophils Relative: 4 %
HCT: 30.7 % — ABNORMAL LOW (ref 36.0–46.0)
Hemoglobin: 8.8 g/dL — ABNORMAL LOW (ref 12.0–15.0)
Immature Granulocytes: 0 %
Lymphocytes Relative: 12 %
Lymphs Abs: 0.6 10*3/uL — ABNORMAL LOW (ref 0.7–4.0)
MCH: 26.8 pg (ref 26.0–34.0)
MCHC: 28.7 g/dL — ABNORMAL LOW (ref 30.0–36.0)
MCV: 93.6 fL (ref 80.0–100.0)
Monocytes Absolute: 0.6 10*3/uL (ref 0.1–1.0)
Monocytes Relative: 13 %
Neutro Abs: 3.2 10*3/uL (ref 1.7–7.7)
Neutrophils Relative %: 70 %
Platelet Count: 130 10*3/uL — ABNORMAL LOW (ref 150–400)
RBC: 3.28 MIL/uL — ABNORMAL LOW (ref 3.87–5.11)
RDW: 16.5 % — ABNORMAL HIGH (ref 11.5–15.5)
WBC Count: 4.5 10*3/uL (ref 4.0–10.5)
nRBC: 0 % (ref 0.0–0.2)

## 2019-11-26 LAB — SAMPLE TO BLOOD BANK

## 2019-11-26 MED ORDER — DARBEPOETIN ALFA 500 MCG/ML IJ SOSY: 500.0000 ug | PREFILLED_SYRINGE | Freq: Once | INTRAMUSCULAR | Status: DC

## 2019-11-26 MED ORDER — DARBEPOETIN ALFA 500 MCG/ML IJ SOSY
PREFILLED_SYRINGE | INTRAMUSCULAR | Status: AC
  Filled 2019-11-26: qty 1

## 2019-11-26 NOTE — Telephone Encounter (Signed)
Scheduled per los, patient has been called and notified. 

## 2019-11-26 NOTE — Patient Instructions (Signed)

## 2019-11-27 ENCOUNTER — Other Ambulatory Visit: Payer: Self-pay | Admitting: *Deleted

## 2019-11-27 DIAGNOSIS — D638 Anemia in other chronic diseases classified elsewhere: Secondary | ICD-10-CM

## 2019-11-30 ENCOUNTER — Ambulatory Visit (INDEPENDENT_AMBULATORY_CARE_PROVIDER_SITE_OTHER): Payer: Medicare Other | Admitting: *Deleted

## 2019-11-30 DIAGNOSIS — R55 Syncope and collapse: Secondary | ICD-10-CM | POA: Diagnosis not present

## 2019-11-30 LAB — CUP PACEART REMOTE DEVICE CHECK
Date Time Interrogation Session: 20210621004818
Implantable Pulse Generator Implant Date: 20190322

## 2019-12-01 NOTE — Progress Notes (Signed)
CARDIOLOGY OFFICE NOTE  Date:  12/08/2019    Hannah [name] Date of Birth: 16-Mar-1946 Medical Record #[id number]  PCP:  [name], DO  Cardiologist:  Atilano Median    Chief Complaint  Patient presents with  . Follow-up    History of Present Illness: Hannah [name] is a 74 y.o. female who presents today for a follow up visit. Seen for Dr. Lovena Le.Primarily follows with me.  She has a very complex/extensivehistory - this includes ahistory ofCAD s/p CABG (1998 with redo 2009- Dr. Cyndia Bent), bioprosthetic AVR in 6662, chronic diastolic CHF, asthma, carotid artery disease (1-39% by duplex 10/2017, due 2021), CKD stage IV(Dr. Posey Pronto), PAF- has been on Multaq in the past and now on amiodarone, atrial tachycardia, breast CA, orthostasis,chronicanemia, thrombocytopenia, mild dilation of ascending aorta by echo 10/2018, recurrent syncope/orthostasis,prior recurrentrightpleural effusion requiring thoracentesis4/2020and 05/2019 with subsequent Pleurex/talc by Dr. Cyndia Bent, HTN, GERD,&HLD.   I have followed her for many years - she has had periodic issues with volume overload, orthostasis and tachy palpitations.Last cath in 2014 - grafts patent. Myoview in 2015 without ischemia.  She has had worsening anemia. She has ended up having multiple repeat breast biopsies that were negative following abnormal mammogram (after her breast cancer). She has had worsening orthostasis - to the point of injury - fell in our parking lot in 02/2019 with significant laceration to the head - she is back on Midodrine. Was not able to have colonoscopy due to those events. EF remains normal. She has had recurrent kidney stones that has resulted in worsening CKD. She requires periodic transfusions for her anemia.   Last seen a month ago - still not feeling that great - weight was up - worsening anemia.   The patient does not have symptoms concerning for COVID-19 infection (fever, chills, cough, or  new shortness of breath).   Comes in today. Here alone today. [name] is out in the car. She says she is doing ok. She is trying to do more - trying to get out of the house each day - if only for a short while. Trying to do a bit more on her own.  She has been switched from Retacrit to Aranesp. Got insurance approved. I think she was on this many years ago. She continues to alternate with her Lasix for her swelling. She had a spell earlier this month - felt bad for about 30 minutes - some palpitations - sent her loop reading in. This was ok. She took her night time Imdur and was fine 30 minutes later. This has not recurred. She has not had chest pain. She feels better with the increased Imdur.   We had a long discussion about COVID - she has not been vaccinated - half of her family has not - this is worrisome. [name] is working. There is less mask wearing in the community. She is now getting out more.   Past Medical History:  Diagnosis Date  . Abnormally small mouth   . Allergic rhinitis 10/14/2009   Qualifier: Diagnosis of  By: Lamonte Sakai MD, Rose Fillers   Overview:  Overview:  Qualifier: Diagnosis of  By: Lamonte Sakai MD, Rose Fillers  Last Assessment & Plan:  Please continue Xyzal and Nasacort as you have been using them  . Anemia   . Asthma 05/12/2009   10/12/2014 p extensive coaching HFA effectiveness =    75% s spacer    Overview:  Overview:  10/12/2014 p extensive coaching HFA effectiveness =  75% s spacer   Last Assessment & Plan:  Please continue Symbicort 2 puffs twice a day. Remember to rinse and gargle after taking this medication. Take albuterol 2 puffs up to every 4 hours if needed for shortness of breath.  Follow with Dr Lamonte Sakai in 6 month  . Bilateral carotid artery stenosis    a. mild 1-39% by duplex 10/2017, due 2021.  . Breast cancer ([place]) 01/18/2016   right breast  . CAD (coronary artery disease) 01/24/2011   a. s/p CABG 1998 with redo 2009.  Marland Kitchen Chemotherapy induced nausea and vomiting 02/24/2016  .  Chemotherapy-induced peripheral neuropathy ([place]) 04/27/2016  . 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 Chronic diastolic CHF (congestive heart failure) ([place]) 09/24/2013  . CKD (chronic kidney disease), stage IV ([place]) 09/24/2013  . Closed fracture of head of left humerus 09/09/2017  . Complication of anesthesia   . Dysrhythmia    a-fib  . Gallstones   . GERD (gastroesophageal reflux disease)   . Glaucoma   . H/O atrial tachycardia 05/12/2009   Qualifier: History of  By: Lamonte Sakai MD, Rose Fillers   Overview:  Overview:  Qualifier: History of  By: Lamonte Sakai MD, Rose Fillers  Last Assessment & Plan:  There was no evidence of this on her cardiac monitor. I would recommend watchful waiting.   Marland Kitchen Heart murmur   . History of kidney stones   . History of non-ST elevation myocardial infarction (NSTEMI)    Sept 2014--  thought to be type II HTN w/ LHC without infarct related artery and patent grafts  . History of radiation therapy 05/24/16-07/26/16   right breast 50.4 Gy in 28 fractions, right breast boost 10 Gy in 5 fractions  . Hyperlipidemia   . Hypotension   . Iron deficiency anemia   . Mania ([place]) 03/05/2016  . Moderate persistent asthma    pulmologist-  Dr. Malvin Johns  . Osteomyelitis of toe of left foot ([place]) 04/06/2016  . Osteopenia of multiple sites 10/19/2015  . PAF (paroxysmal atrial fibrillation) ([place])   . Personal history of chemotherapy 2017  . Personal history of radiation therapy 2017  . PONV (postoperative nausea and vomiting)   . Port catheter in place 02/17/2016  . Psoriasis    right leg  . Renal calculus, right   . S/P AVR    prosthesis valve placement 2009 at same time re-do CABG  . Seizure-like activity ([place]) 09/01/2017  . Sensorineural hearing loss (SNHL) of both ears 01/06/2016  . Stroke [facility]) 2014   residual rt hearing loss  . Syncope 09/06/2017  . Type 2 diabetes mellitus ([place])    monitored by dr Dwyane Dee    Past Surgical History:  Procedure Laterality Date  . AORTIC VALVE  REPLACEMENT  [facility]   [facility] same time is [facility]G)  . BREAST LUMPECTOMY Right 01/18/2016  . BREAST LUMPECTOMY WITH RADIOACTIVE SEED AND SENTINEL LYMPH NODE BIOPSY Right 01/18/2016   Procedure: RIGHT BREAST LUMPECTOMY WITH RADIOACTIVE SEED AND SENTINEL LYMPH NODE BIOPSY;  Surgeon: DAlphonsa Overall MD;  [place]  Service: General;  Laterality: Right;  . CARDIAC CATHETERIZATION  03/23/2008   Pre-redo CABG: L main OK, LAD (T), CFX (T), OM1 99%, RCA (T), LIMA-LAD OK, SVG-OM(?3) OK w/ little florw to OM2, SVG-RCA OK. EF NL  . CARPAL TUNNEL RELEASE    . CHEST TUBE INSERTION Right 06/26/2019   Procedure: INSERTION PLEURAL DRAINAGE CATHETER;  Surgeon: BGaye Pollack MD;  [facility] [place] [facility]  Service:  Thoracic;  Laterality: Right;  . CHOLECYSTECTOMY N/A 07/07/2018   Procedure: LAPAROSCOPIC CHOLECYSTECTOMY WITH INTRAOPERATIVE CHOLANGIOGRAM ERAS PATHWAY;  Surgeon: Alphonsa Overall, MD;  [facility];  Service: General;  Laterality: N/A;  . COLONOSCOPY     around 2015. Possibly with [facility]   . CORONARY ARTERY BYPASS GRAFT  1998 &  re-do 2009   Had LIMA to DX/LAD, SVG to 2 marginal branches and SVG to [facility] originally; SVG to 3rd OM and PD at time of redo  . CYSTOSCOPY W/ URETERAL STENT PLACEMENT Right 12/20/2014   Procedure: CYSTOSCOPY WITH RETROGRADE PYELOGRAM/URETERAL STENT PLACEMENT;  Surgeon: Cleon Gustin, MD;  Location[facility];  Service: Urology;  Laterality: Right;  . CYSTOSCOPY WITH RETROGRADE PYELOGRAM, URETEROSCOPY AND STENT PLACEMENT Left 08/21/2019   Procedure: CYSTOSCOPY WITH RETROGRADE PYELOGRAM, URETEROSCOPY AND STENT PLACEMENT;  Surgeon: Cleon Gustin, MD;  [facility];  Service: Urology;  Laterality: Left;  1 HR  . ESOPHAGOGASTRODUODENOSCOPY     many years ago per patient   . ESOPHAGOGASTRODUODENOSCOPY ENDOSCOPY  06/17/2018  . EYE SURGERY Bilateral    cataracts  . HOLMIUM LASER APPLICATION Right 5/32/9924   Procedure:   HOLMIUM LASER LITHOTRIPSY;  Surgeon: Cleon Gustin, MD;  Location: [facility];  Service: Urology;  Laterality: Right;  . HOLMIUM LASER APPLICATION Left 2/68/3419   Procedure: HOLMIUM LASER APPLICATION;  Surgeon: Cleon Gustin, MD;  [facility];  Service: Urology;  Laterality: Left;  . IR THORACENTESIS ASP PLEURAL SPACE W/IMG GUIDE  10/03/2018  . IR THORACENTESIS ASP PLEURAL SPACE W/IMG GUIDE  06/08/2019  . LEFT HEART CATHETERIZATION WITH CORONARY/GRAFT ANGIOGRAM N/A 02/23/2013   Procedure: LEFT HEART CATHETERIZATION WITH Beatrix Fetters;  Surgeon: Blane Ohara, MD;  Location: [facility] CATH LAB;  Service: Cardiovascular;  Laterality: N/A;  . LOOP RECORDER INSERTION N/A 08/30/2017   Procedure: LOOP RECORDER INSERTION;  Surgeon: Evans Lance, MD;  Location: [place] CV LAB;  Service: Cardiovascular;  Laterality: N/A;  . PORTACATH PLACEMENT Left 01/18/2016   Procedure: INSERTION PORT-A-CATH;  Surgeon: Alphonsa Overall, MD;  Location: [place];  Service: General;  Laterality: Left;  . portacath removal    . REMOVAL OF PLEURAL DRAINAGE CATHETER Right 07/14/2019   Procedure: REMOVAL OF PLEURAL DRAINAGE CATHETER;  Surgeon: Gaye Pollack, MD;  Location: [place];  Service: Thoracic;  Laterality: Right;  . TALC PLEURODESIS N/A 06/26/2019   Procedure: Pietro Cassis;  Surgeon: Gaye Pollack, MD;  [facility];  Service: Thoracic;  Laterality: N/A;  . TONSILLECTOMY    . TRANSTHORACIC ECHOCARDIOGRAM  02-24-2013      mild LVH,  ef 50-55%/  AV bioprosthesis was present with very mild stenosis and no regurg., mean grandient 75mHg, peak grandient 274mg /  mild MR/  mild LAE and RAE/  moderate TR  . TUBAL LIGATION       Medications: Current Meds  Medication Sig  . ACCU-CHEK GUIDE test strip Use to test blood sugar 3 times daily  . acetaminophen (TYLENOL) 325 MG tablet Take 2 tablets (650 mg total) by mouth every 6 (six) hours as needed for mild pain (or Fever >/= 101).    . Marland Kitchenlbuterol (VENTOLIN HFA) 108 (90 Base) MCG/ACT inhaler Inhale 2 puffs into the lungs every 6 (six) hours as needed for wheezing or shortness of breath.  . Marland Kitchenmiodarone (PACERONE) 200 MG tablet Take 1 tablet (200 mg total) by mouth daily.  . Blood Glucose Monitoring Suppl (ACCU-CHEK GUIDE ME) w/Device KIT  Use accu chek meter to check blood sugar three times daily.  . budesonide-formoterol (SYMBICORT) 160-4.5 MCG/ACT inhaler Inhale 2 puffs into the lungs 2 (two) times daily.  . Dulaglutide (TRULICITY) 1.5 JJ/0.0XF SOPN Inject 1.5 mg into the skin once a week.  Marland Kitchen ELIQUIS 5 MG TABS tablet Take 1 tablet (5 mg total) by mouth 2 (two) times daily.  Marland Kitchen exemestane (AROMASIN) 25 MG tablet TAKE ONE TABLET BY MOUTH DAILY AFTER BREAKFAST  . ezetimibe (ZETIA) 10 MG tablet TAKE ONE TABLET BY MOUTH EVERY DAY  . famotidine (PEPCID) 20 MG tablet Take 20 mg by mouth 2 (two) times daily.  . furosemide (LASIX) 20 MG tablet Take 20 mg by mouth daily as needed.  Marland Kitchen HYDROcodone-acetaminophen (NORCO/VICODIN) 5-325 MG tablet Take 1-2 tablets by mouth every 6 (six) hours as needed.  . insulin glargine (LANTUS) 100 UNIT/ML injection Inject 18 Units into the skin daily.  . Insulin Lispro (HUMALOG KWIKPEN [place]) Inject 4-6 Units into the skin 3 (three) times daily.  . Insulin Pen Needle (BD PEN NEEDLE NANO U/F) 32G X 4 MM MISC USE AS INSTRUCTED TO INJECT INSULIN 4 TIMES DAILY.  . isosorbide mononitrate (IMDUR) 30 MG 24 hr tablet Take 1 tablet (30 mg total) by mouth daily.  Marland Kitchen LANTUS SOLOSTAR 100 UNIT/ML Solostar Pen INJECT SUBCUTANEOUSLY 16  UNITS DAILY IN THE MORNING  . loratadine (CLARITIN) 10 MG tablet Take 10 mg by mouth daily.   . metoprolol tartrate (LOPRESSOR) 25 MG tablet TAKE 1 TABLET BY MOUTH  TWICE DAILY  . midodrine (PROAMATINE) 2.5 MG tablet Take 2.5 mg by mouth 3 (three) times daily with meals.  . nitroGLYCERIN (NITROSTAT) 0.4 MG SL tablet Place 1 tablet (0.4 mg total) under the tongue every 5 (five) minutes as  needed for chest pain.  Marland Kitchen ondansetron (ZOFRAN-ODT) 4 MG disintegrating tablet Take 1 tablet (4 mg total) by mouth every 8 (eight) hours as needed for nausea or vomiting.  . rosuvastatin (CRESTOR) 40 MG tablet Take 1 tablet (40 mg total) by mouth at bedtime.  Marland Kitchen zolpidem (AMBIEN) 5 MG tablet Take 5 mg by mouth at bedtime.      Allergies: Allergies  Allergen Reactions  . Amoxicillin Rash and Other (See Comments)    Tolerates Cephalosporins Has patient had a PCN reaction causing immediate rash, facial/tongue/throat swelling, SOB or lightheadedness with hypotension: Yes Has patient had a PCN reaction causing severe rash involving mucus membranes or skin necrosis: Yes Has patient had a PCN reaction that required hospitalization No Has patient had a PCN reaction occurring within the last 10 years: No If all of the above answers are "NO", then may proceed with Cephalosporin use.   . Tape Other (See Comments)    Must use paper tape  . Aldactone [Spironolactone] Other (See Comments)    CKD/hypokalemia  . Imdur [Isosorbide Dinitrate] Other (See Comments)    Headache/severe hypotension/Syncope  . Arimidex [Anastrozole] Nausea Only  . Latex Itching and Other (See Comments)    (Dentist office)  . Tetracycline Rash    Social History: The patient  reports that she has never smoked. She has never used smokeless tobacco. She reports that she does not drink alcohol and does not use drugs.   Family History: The patient's family history includes Diabetes in her maternal grandmother and son; Healthy in her brother; Heart attack in her brother; Heart disease in her brother, father, and maternal grandmother; Heart failure in her father.   Review of Systems: Please see the history  of present illness.   All other systems are reviewed and negative.   Physical Exam: VS:  BP (!) 140/50   Pulse 63   Ht _0  (1.626 m)   Wt 150 lb (68 kg)   LMP  (LMP Unknown)   SpO2 97%   BMI 25.75 kg/m  .  BMI Body  mass index is 25.75 kg/m.  Wt Readings from Last 3 Encounters:  12/08/19 150 lb (68 kg)  11/05/19 149 lb (67.6 kg)  11/04/19 150 lb (68 kg)    General: Alert and in no acute distress.  Color is sallow.  Cardiac: Regular rate and rhythm. Outflow murmur noted. She has trace edema.  Respiratory:  Lungs are clear to auscultation bilaterally with normal work of breathing.  GI: Soft and nontender.  MS: No deformity or atrophy. Gait and ROM intact.  Skin: Warm and dry. Color is normal.  Neuro:  Strength and sensation are intact and no gross focal deficits noted.  Psych: Alert, appropriate and with normal affect.   LABORATORY DATA:  EKG:  EKG is not ordered today.   Lab Results  Component Value Date   WBC 4.8 12/03/2019   HGB 8.7 (L) 12/03/2019   HCT 30.1 (L) 12/03/2019   PLT 117 (L) 12/03/2019   GLUCOSE 184 (H) 10/30/2019   CHOL 106 12/02/2018   TRIG 97 12/02/2018   HDL 34 (L) 12/02/2018   LDLDIRECT 53.0 10/08/2016   LDLCALC 53 12/02/2018   ALT 31 10/30/2019   AST 41 10/30/2019   NA 139 10/30/2019   K 3.6 10/30/2019   CL 100 10/30/2019   CREATININE 2.59 (H) 10/30/2019   BUN 42 (H) 10/30/2019   CO2 24 10/30/2019   TSH 3.090 08/03/2019   INR 1.4 (H) 06/24/2019   HGBA1C 6.1 (H) 08/13/2019   MICROALBUR 14.2 (H) 07/02/2019     BNP (last 3 results) Recent Labs    03/04/19 1429  BNP 273.4*    ProBNP (last 3 results) Recent Labs    02/19/19 1126 02/23/19 1221  PROBNP 2,996* 2,825*     Other Studies Reviewed Today:  ECHO IMPRESSIONS 05/2019  1. Left ventricular ejection fraction, by visual estimation, is 60 to 65%. The left ventricle has normal function. Left ventricular septal wall thickness was normal. Normal left ventricular posterior wall thickness. There is no left ventricular  hypertrophy.  2. Elevated left ventricular end-diastolic pressure.  3. Left ventricular diastolic parameters are consistent with Grade II diastolic dysfunction  (pseudonormalization).  4. Global right ventricle has normal systolic function.The right ventricular size is normal. No increase in right ventricular wall thickness.  5. Left atrial size was severely dilated.  6. Right atrial size was normal.  7. Mild mitral annular calcification.  8. Mild thickening of the anterior and posterior mitral valve leaflet(s).  9. The mitral valve is normal in structure. Mild mitral valve regurgitation. No evidence of mitral stenosis.  10. The tricuspid valve is normal in structure.  11. Aortic valve regurgitation is not visualized. No evidence of aortic valve sclerosis or stenosis.  12. The pulmonic valve was normal in structure. Pulmonic valve regurgitation is trivial.  13. Normal pulmonary artery systolic pressure.  14. The inferior vena cava is normal in size with greater than 50% respiratory variability, suggesting right atrial pressure of 3 mmHg.   CT CHEST IMPRESSION 04/2019:  Moderate right pleural effusion, increased from prior CT. Associated  right lower lobe atelectasis.  No evidence of pneumonia.  1.2 x 2.4 cm lesion  along the inferior aspect of the right  breast/chest wall. While additional findings noted above correlate  with areas of fat necrosis, this lesion was not clearly identified  when correlating with prior breast tomography report. Breast imaging  consultation is suggested for further evaluation, including  potential ultrasound with biopsy versus breast MR. These results  will be called to the ordering clinician or representative by the  Radiologist Assistant, and communication documented in the PACS or  zVision Dashboard.  Aortic Atherosclerosis (ICD10-I70.0).  Electronically Signed  By: Julian Hy M.D.  On: 05/06/2019 18:11   CT ABDOMEN IMPRESSION 03/2019: Chest Impression:  1. Mild RIGHT basilar atelectasis and small RIGHT effusion.  2. Increased skin thickening in the lateral RIGHT breast. Recommend  clinical  correlation.  3. Stable seroma in the RIGHT breast.   Abdomen / Pelvis Impression:  1. No acute findings in the abdomen pelvis.  2. Nonobstructing calculi in the LEFT renal pelvis.  3. Degenerative endplate change in the lumbar spine similar to  comparison CT 2019.  4. Coronary artery calcification and Aortic Atherosclerosis  (ICD10-I70.0).  Electronically Signed  By: Suzy Bouchard M.D.  On: 03/17/2019 09:59   ABD [place] 02/17/2019  Other findings: There is a somewhat lobulated fluid collection  identified along the inferior aspect of the liver. This measures 2.6  x 3.4 x 4.3 cm. This was not seen on the prior CT examination and  may be related to a focal fluid collection from prior  cholecystectomy. Mild perihepatic ascites is noted.  Note is made of right-sided pleural effusion.  IMPRESSION:  Status post cholecystectomy. There is a lobulated cystic area as  described above along the inferior right hepatic margin. This could  be related to the prior surgery possibly representing a small  seroma. CT may be helpful for further evaluation.  Small right pleural effusion.  Mild ascites.  Increased echogenicity within the liver likely related to fatty  infiltration or underlying hepatocellular disease.  Electronically Signed  By: Inez Catalina M.D.  On: 02/17/2019 10:18    Breast Ultrasound 01/2019  IMPRESSION:  1. New irregular hypoechoic mass along the posterior margin of the  scar right breast 8 o'clock position. Findings are nonspecific  however concerning for the possibility of localized recurrence.  2. Within the outer aspect of the right axilla there are two  adjacent irregular hypoechoic masses just deep to the skin which are  nonspecific. These may represent focal fat necrosis or potentially  recurrent or metastatic disease.  RECOMMENDATION:  1. Ultrasound-guided core needle biopsy right breast mass 8 o'clock  position just deep to the scar.  2. Ultrasound-guided  core needle biopsy of both irregular hypoechoic  masses within the outer aspect of the right axilla.    MYOVIEW FINDINGS FROM 06/2013:  Normal resting EKG. Slight ST depressions noted in aVL after  administration of lexiscan. Mild shortness of breath and nausea  resolved after the test. EKG is nondiagnostic for ischemia. TID  ratio 1.05. Lung-heart ratio 0.43. Normal ventricular chamber size.  IMPRESSION:  No evidence for ischemia. Normal wall motion. LVEF 72%.  Electronically Signed  By: Guy Sandifer    Cardiac Cath: 02/23/2013  Left mainstem: Normal  Left anterior descending (LAD): Severe proximal and mid calcification with long proximal 95% stenosis. The mid and distal vessel is small but free of high grade disease.  Left circumflex (LCx): AV groove has a mid 90% stenosis before a moderate sized MOM. OM1 and OM2 are occluded at the ostium and fill  via the SVG. OM3 occluded at the ostium and fills via SVG. The grafted OMs are small and diffusely disease.  Right coronary artery (RCA): Occluded in the mid vessel. The PDA is moderate sized and occluded at the ostium. There is a 70% stenosis in the PDA after the insertion of the vein graft.  Grafts:  LIMA to LAD: Patent  SVG to RCA: Occluded (from original CABG)  SVG to RCA: Patent (from redo CABG). This is mild diffuse plaque within the vein graft.  SVG to OM3: Patent. This is mild diffuse plaque within the vein graft.  SVG sequential to OM1/OM2: Patent. There is ostial 50% stenosis. There is a patent proximal SVG stent. The native marginals are very small.  Left ventriculography: LV not injected. AVR not crossed.  Final Conclusions: Severe native vessel CAD. Patent grafts as described with nonobstructive disease in the grafts. A stent placed into the sequential SVG is patent with some disease at the ostium that on several views in not occlusive. I did compare the 2009 cath with results today. There is high grade disease in the circumflex  AV groove. However, this is not changed from 2009. However, this lesion does appear to lead into a non grafted OM. If she has further symptoms I would consider PCI of the native circumflex.     Assessment/Plan:  1. CAD - prior CABG and then redo CABG in 2009 - her last cath was from 2014 - she is managed medically. She has done well with recent increase in her Imdur.   2. Prior kidney stones - not discussed.   3. Prior pleural effusion - this is now resolved.   4. CKD - follows with Renal.   5. Orthostatic hypotension - with prior injury x 2 - on chronic Midodrine.   6. Chronic diastolic HF - swelling worse with lower HGB - she is alternating her Lasix dose.   7. Chronic anemia - now on Aranesp - hopefully this will be more beneficial.   8. Carotid disease - needs her study updated - we will arrange.   9. Prior breast cancer - per oncology.   10. PAF - in sinus on amiodarone - labs are pretty much up to date. ILR is in place.   11. COVID 19 - I worry that she will have exposure given that half her family has not been vaccinated. She is going to discuss with Oncology and PCP.    Current medicines are reviewed with the patient today.  The patient does not have concerns regarding medicines other than what has been noted above.  The following changes have been made:  See above.  Labs/ tests ordered today include:    Orders Placed This Encounter  Procedures  . VAS US CAROTID     Disposition:   FU with me in 6 weeks.   Patient is agreeable to this plan and will call if any problems develop in the interim.   SignedTruitt Merle, NP  12/08/2019 2:18 PM  [facility] [address] [place], [place]  [address] Phone: [phone] Fax: [phone]

## 2019-12-01 NOTE — Progress Notes (Signed)
[facility] Summary Report / Loop Recorder 

## 2019-12-02 ENCOUNTER — Telehealth: Payer: Self-pay

## 2019-12-02 NOTE — Telephone Encounter (Signed)
The pt states she did not feel well. She said her heart was racing. She marked the symptom. I told her I will have the nurse to review the transmission and give her a call back

## 2019-12-02 NOTE — Telephone Encounter (Signed)
Symptom activated episode reviewed.  SR rate about 77 bpm  Pt states at time of episode she felt nauseous and like her heart was racing.  She did not feel right.  She took her nightly dose of imdur and feeling resolved.  Pt has appt with Truitt Merle next week.  Stressed to pt ED precautions.

## 2019-12-03 ENCOUNTER — Inpatient Hospital Stay: Payer: Medicare Other

## 2019-12-03 ENCOUNTER — Ambulatory Visit: Payer: Medicare Other

## 2019-12-03 ENCOUNTER — Other Ambulatory Visit: Payer: Medicare Other

## 2019-12-03 ENCOUNTER — Other Ambulatory Visit: Payer: Self-pay

## 2019-12-03 DIAGNOSIS — D638 Anemia in other chronic diseases classified elsewhere: Secondary | ICD-10-CM

## 2019-12-03 DIAGNOSIS — I129 Hypertensive chronic kidney disease with stage 1 through stage 4 chronic kidney disease, or unspecified chronic kidney disease: Secondary | ICD-10-CM | POA: Diagnosis not present

## 2019-12-03 LAB — CBC WITH DIFFERENTIAL (CANCER CENTER ONLY)
Abs Immature Granulocytes: 0.01 10*3/uL (ref 0.00–0.07)
Basophils Absolute: 0.1 10*3/uL (ref 0.0–0.1)
Basophils Relative: 1 %
Eosinophils Absolute: 0.2 10*3/uL (ref 0.0–0.5)
Eosinophils Relative: 4 %
HCT: 30.1 % — ABNORMAL LOW (ref 36.0–46.0)
Hemoglobin: 8.7 g/dL — ABNORMAL LOW (ref 12.0–15.0)
Immature Granulocytes: 0 %
Lymphocytes Relative: 10 %
Lymphs Abs: 0.5 10*3/uL — ABNORMAL LOW (ref 0.7–4.0)
MCH: 26.8 pg (ref 26.0–34.0)
MCHC: 28.9 g/dL — ABNORMAL LOW (ref 30.0–36.0)
MCV: 92.6 fL (ref 80.0–100.0)
Monocytes Absolute: 0.6 10*3/uL (ref 0.1–1.0)
Monocytes Relative: 13 %
Neutro Abs: 3.4 10*3/uL (ref 1.7–7.7)
Neutrophils Relative %: 72 %
Platelet Count: 117 10*3/uL — ABNORMAL LOW (ref 150–400)
RBC: 3.25 MIL/uL — ABNORMAL LOW (ref 3.87–5.11)
RDW: 15.6 % — ABNORMAL HIGH (ref 11.5–15.5)
WBC Count: 4.8 10*3/uL (ref 4.0–10.5)
nRBC: 0 % (ref 0.0–0.2)

## 2019-12-03 LAB — SAMPLE TO BLOOD BANK

## 2019-12-07 ENCOUNTER — Ambulatory Visit: Payer: Medicare Other | Admitting: Hematology and Oncology

## 2019-12-07 ENCOUNTER — Other Ambulatory Visit: Payer: Medicare Other

## 2019-12-07 ENCOUNTER — Ambulatory Visit: Payer: Medicare Other

## 2019-12-08 ENCOUNTER — Ambulatory Visit (INDEPENDENT_AMBULATORY_CARE_PROVIDER_SITE_OTHER): Payer: Medicare Other | Admitting: Nurse Practitioner

## 2019-12-08 ENCOUNTER — Encounter: Payer: Self-pay | Admitting: Nurse Practitioner

## 2019-12-08 ENCOUNTER — Other Ambulatory Visit: Payer: Self-pay

## 2019-12-08 VITALS — BP 140/50 | HR 63 | Ht 64.0 in | Wt 150.0 lb

## 2019-12-08 DIAGNOSIS — N184 Chronic kidney disease, stage 4 (severe): Secondary | ICD-10-CM

## 2019-12-08 DIAGNOSIS — I6523 Occlusion and stenosis of bilateral carotid arteries: Secondary | ICD-10-CM

## 2019-12-08 DIAGNOSIS — I2510 Atherosclerotic heart disease of native coronary artery without angina pectoris: Secondary | ICD-10-CM

## 2019-12-08 DIAGNOSIS — Z79899 Other long term (current) drug therapy: Secondary | ICD-10-CM | POA: Diagnosis not present

## 2019-12-08 DIAGNOSIS — I5032 Chronic diastolic (congestive) heart failure: Secondary | ICD-10-CM | POA: Diagnosis not present

## 2019-12-08 DIAGNOSIS — Z952 Presence of prosthetic heart valve: Secondary | ICD-10-CM

## 2019-12-08 NOTE — Patient Instructions (Addendum)
After Visit Summary:  We will be checking the following labs today - NONE   Medication Instructions:    Continue with your current medicines.    If you need a refill on your cardiac medications before your next appointment, please call your pharmacy.     Testing/Procedures To Be Arranged:  Carotid doppler study  Follow-Up:   See me in about 6 weeks    At [facility], you and your health needs are our priority.  As part of our continuing mission to provide you with exceptional heart care, we have created designated Provider Care Teams.  These Care Teams include your primary Cardiologist (physician) and Advanced Practice Providers (APPs -  Physician Assistants and Nurse Practitioners) who all work together to provide you with the care you need, when you need it.  Special Instructions:  . Stay safe, wash your hands for at least 20 seconds and wear a mask when needed.  . It was good to talk with you today.  . Talk with your primary and Dr. Lindi Adie about the COVID vaccine.    Call the [place] office at [phone] if you have any questions, problems or concerns.

## 2019-12-10 ENCOUNTER — Ambulatory Visit: Payer: Medicare Other

## 2019-12-10 ENCOUNTER — Ambulatory Visit: Payer: Medicare Other | Admitting: Adult Health

## 2019-12-10 ENCOUNTER — Other Ambulatory Visit: Payer: Medicare Other

## 2019-12-11 ENCOUNTER — Other Ambulatory Visit (INDEPENDENT_AMBULATORY_CARE_PROVIDER_SITE_OTHER): Payer: Medicare Other

## 2019-12-11 ENCOUNTER — Inpatient Hospital Stay (HOSPITAL_BASED_OUTPATIENT_CLINIC_OR_DEPARTMENT_OTHER): Payer: Medicare Other | Admitting: Adult Health

## 2019-12-11 ENCOUNTER — Inpatient Hospital Stay: Payer: Medicare Other

## 2019-12-11 ENCOUNTER — Inpatient Hospital Stay: Payer: Medicare Other | Attending: Adult Health

## 2019-12-11 ENCOUNTER — Other Ambulatory Visit: Payer: Self-pay

## 2019-12-11 ENCOUNTER — Other Ambulatory Visit: Payer: Self-pay | Admitting: *Deleted

## 2019-12-11 ENCOUNTER — Telehealth: Payer: Self-pay | Admitting: Hematology and Oncology

## 2019-12-11 VITALS — BP 93/36 | HR 63 | Temp 98.5°F | Resp 17 | Ht 64.0 in | Wt 147.3 lb

## 2019-12-11 DIAGNOSIS — Z9221 Personal history of antineoplastic chemotherapy: Secondary | ICD-10-CM | POA: Insufficient documentation

## 2019-12-11 DIAGNOSIS — N184 Chronic kidney disease, stage 4 (severe): Secondary | ICD-10-CM | POA: Diagnosis not present

## 2019-12-11 DIAGNOSIS — Z79811 Long term (current) use of aromatase inhibitors: Secondary | ICD-10-CM | POA: Diagnosis not present

## 2019-12-11 DIAGNOSIS — E1142 Type 2 diabetes mellitus with diabetic polyneuropathy: Secondary | ICD-10-CM | POA: Insufficient documentation

## 2019-12-11 DIAGNOSIS — M8580 Other specified disorders of bone density and structure, unspecified site: Secondary | ICD-10-CM | POA: Diagnosis not present

## 2019-12-11 DIAGNOSIS — Z170 Estrogen receptor positive status [ER+]: Secondary | ICD-10-CM

## 2019-12-11 DIAGNOSIS — D638 Anemia in other chronic diseases classified elsewhere: Secondary | ICD-10-CM

## 2019-12-11 DIAGNOSIS — I4891 Unspecified atrial fibrillation: Secondary | ICD-10-CM | POA: Insufficient documentation

## 2019-12-11 DIAGNOSIS — Z923 Personal history of irradiation: Secondary | ICD-10-CM | POA: Insufficient documentation

## 2019-12-11 DIAGNOSIS — K219 Gastro-esophageal reflux disease without esophagitis: Secondary | ICD-10-CM | POA: Insufficient documentation

## 2019-12-11 DIAGNOSIS — E1136 Type 2 diabetes mellitus with diabetic cataract: Secondary | ICD-10-CM | POA: Diagnosis not present

## 2019-12-11 DIAGNOSIS — E876 Hypokalemia: Secondary | ICD-10-CM | POA: Diagnosis not present

## 2019-12-11 DIAGNOSIS — G40909 Epilepsy, unspecified, not intractable, without status epilepticus: Secondary | ICD-10-CM | POA: Insufficient documentation

## 2019-12-11 DIAGNOSIS — E785 Hyperlipidemia, unspecified: Secondary | ICD-10-CM | POA: Diagnosis not present

## 2019-12-11 DIAGNOSIS — Z8669 Personal history of other diseases of the nervous system and sense organs: Secondary | ICD-10-CM | POA: Insufficient documentation

## 2019-12-11 DIAGNOSIS — Z794 Long term (current) use of insulin: Secondary | ICD-10-CM

## 2019-12-11 DIAGNOSIS — D696 Thrombocytopenia, unspecified: Secondary | ICD-10-CM | POA: Diagnosis not present

## 2019-12-11 DIAGNOSIS — J90 Pleural effusion, not elsewhere classified: Secondary | ICD-10-CM | POA: Insufficient documentation

## 2019-12-11 DIAGNOSIS — M869 Osteomyelitis, unspecified: Secondary | ICD-10-CM | POA: Diagnosis not present

## 2019-12-11 DIAGNOSIS — I252 Old myocardial infarction: Secondary | ICD-10-CM | POA: Insufficient documentation

## 2019-12-11 DIAGNOSIS — T451X5A Adverse effect of antineoplastic and immunosuppressive drugs, initial encounter: Secondary | ICD-10-CM | POA: Insufficient documentation

## 2019-12-11 DIAGNOSIS — Z79899 Other long term (current) drug therapy: Secondary | ICD-10-CM | POA: Diagnosis not present

## 2019-12-11 DIAGNOSIS — D631 Anemia in chronic kidney disease: Secondary | ICD-10-CM | POA: Diagnosis not present

## 2019-12-11 DIAGNOSIS — I129 Hypertensive chronic kidney disease with stage 1 through stage 4 chronic kidney disease, or unspecified chronic kidney disease: Secondary | ICD-10-CM | POA: Diagnosis not present

## 2019-12-11 DIAGNOSIS — I480 Paroxysmal atrial fibrillation: Secondary | ICD-10-CM | POA: Insufficient documentation

## 2019-12-11 DIAGNOSIS — E1122 Type 2 diabetes mellitus with diabetic chronic kidney disease: Secondary | ICD-10-CM | POA: Insufficient documentation

## 2019-12-11 DIAGNOSIS — C50211 Malignant neoplasm of upper-inner quadrant of right female breast: Secondary | ICD-10-CM

## 2019-12-11 DIAGNOSIS — Z87442 Personal history of urinary calculi: Secondary | ICD-10-CM | POA: Insufficient documentation

## 2019-12-11 DIAGNOSIS — I5032 Chronic diastolic (congestive) heart failure: Secondary | ICD-10-CM | POA: Diagnosis not present

## 2019-12-11 DIAGNOSIS — D649 Anemia, unspecified: Secondary | ICD-10-CM

## 2019-12-11 DIAGNOSIS — Z8673 Personal history of transient ischemic attack (TIA), and cerebral infarction without residual deficits: Secondary | ICD-10-CM | POA: Insufficient documentation

## 2019-12-11 DIAGNOSIS — E1165 Type 2 diabetes mellitus with hyperglycemia: Secondary | ICD-10-CM

## 2019-12-11 DIAGNOSIS — I2510 Atherosclerotic heart disease of native coronary artery without angina pectoris: Secondary | ICD-10-CM | POA: Diagnosis not present

## 2019-12-11 DIAGNOSIS — G620 Drug-induced polyneuropathy: Secondary | ICD-10-CM | POA: Insufficient documentation

## 2019-12-11 DIAGNOSIS — D500 Iron deficiency anemia secondary to blood loss (chronic): Secondary | ICD-10-CM

## 2019-12-11 LAB — CBC WITH DIFFERENTIAL (CANCER CENTER ONLY)
Abs Immature Granulocytes: 0.01 10*3/uL (ref 0.00–0.07)
Basophils Absolute: 0 10*3/uL (ref 0.0–0.1)
Basophils Relative: 1 %
Eosinophils Absolute: 0.1 10*3/uL (ref 0.0–0.5)
Eosinophils Relative: 3 %
HCT: 25.9 % — ABNORMAL LOW (ref 36.0–46.0)
Hemoglobin: 7.5 g/dL — ABNORMAL LOW (ref 12.0–15.0)
Immature Granulocytes: 0 %
Lymphocytes Relative: 9 %
Lymphs Abs: 0.5 10*3/uL — ABNORMAL LOW (ref 0.7–4.0)
MCH: 27.2 pg (ref 26.0–34.0)
MCHC: 29 g/dL — ABNORMAL LOW (ref 30.0–36.0)
MCV: 93.8 fL (ref 80.0–100.0)
Monocytes Absolute: 0.6 10*3/uL (ref 0.1–1.0)
Monocytes Relative: 11 %
Neutro Abs: 4 10*3/uL (ref 1.7–7.7)
Neutrophils Relative %: 76 %
Platelet Count: 126 10*3/uL — ABNORMAL LOW (ref 150–400)
RBC: 2.76 MIL/uL — ABNORMAL LOW (ref 3.87–5.11)
RDW: 16.6 % — ABNORMAL HIGH (ref 11.5–15.5)
WBC Count: 5.2 10*3/uL (ref 4.0–10.5)
nRBC: 0 % (ref 0.0–0.2)

## 2019-12-11 LAB — SAMPLE TO BLOOD BANK

## 2019-12-11 LAB — PREPARE RBC (CROSSMATCH)

## 2019-12-11 LAB — GLUCOSE, RANDOM: Glucose, Bld: 147 mg/dL — ABNORMAL HIGH (ref 70–99)

## 2019-12-11 MED ORDER — ACETAMINOPHEN 325 MG PO TABS
ORAL_TABLET | ORAL | Status: AC
  Filled 2019-12-11: qty 2

## 2019-12-11 MED ORDER — SODIUM CHLORIDE 0.9% IV SOLUTION
250.0000 mL | Freq: Once | INTRAVENOUS | Status: AC
  Administered 2019-12-11: 250 mL via INTRAVENOUS
  Filled 2019-12-11: qty 250

## 2019-12-11 MED ORDER — DARBEPOETIN ALFA 500 MCG/ML IJ SOSY
PREFILLED_SYRINGE | INTRAMUSCULAR | Status: AC
  Filled 2019-12-11: qty 1

## 2019-12-11 MED ORDER — ACETAMINOPHEN 325 MG PO TABS
650.0000 mg | ORAL_TABLET | Freq: Once | ORAL | Status: AC
  Administered 2019-12-11: 650 mg via ORAL

## 2019-12-11 MED ORDER — DARBEPOETIN ALFA 500 MCG/ML IJ SOSY
500.0000 ug | PREFILLED_SYRINGE | Freq: Once | INTRAMUSCULAR | Status: AC
  Administered 2019-12-11: 500 ug via SUBCUTANEOUS

## 2019-12-11 MED ORDER — DIPHENHYDRAMINE HCL 25 MG PO CAPS
25.0000 mg | ORAL_CAPSULE | Freq: Once | ORAL | Status: AC
  Administered 2019-12-11: 25 mg via ORAL

## 2019-12-11 MED ORDER — DIPHENHYDRAMINE HCL 25 MG PO CAPS
ORAL_CAPSULE | ORAL | Status: AC
  Filled 2019-12-11: qty 1

## 2019-12-11 NOTE — Telephone Encounter (Signed)
Scheduled appts per 7/2 los. Pt aware of next appt date and time.

## 2019-12-11 NOTE — Patient Instructions (Signed)
Blood Transfusion, Adult, Care After This sheet gives you information about how to care for yourself after your procedure. Your doctor may also give you more specific instructions. If you have problems or questions, contact your doctor. What can I expect after the procedure? After the procedure, it is common to have:  Bruising and soreness at the IV site.  A fever or chills on the day of the procedure. This may be your body's response to the new blood cells received.  A headache. Follow these instructions at home: Insertion site care      Follow instructions from your doctor about how to take care of your insertion site. This is where an IV tube was put into your vein. Make sure you: ? Wash your hands with soap and water before and after you change your bandage (dressing). If you cannot use soap and water, use hand sanitizer. ? Change your bandage as told by your doctor.  Check your insertion site every day for signs of infection. Check for: ? Redness, swelling, or pain. ? Bleeding from the site. ? Warmth. ? Pus or a bad smell. General instructions  Take over-the-counter and prescription medicines only as told by your doctor.  Rest as told by your doctor.  Go back to your normal activities as told by your doctor.  Keep all follow-up visits as told by your doctor. This is important. Contact a doctor if:  You have itching or red, swollen areas of skin (hives).  You feel worried or nervous (anxious).  You feel weak after doing your normal activities.  You have redness, swelling, warmth, or pain around the insertion site.  You have blood coming from the insertion site, and the blood does not stop with pressure.  You have pus or a bad smell coming from the insertion site. Get help right away if:  You have signs of a serious reaction. This may be coming from an allergy or the body's defense system (immune system). Signs include: ? Trouble breathing or shortness of  breath. ? Swelling of the face or feeling warm (flushed). ? Fever or chills. ? Head, chest, or back pain. ? Dark pee (urine) or blood in the pee. ? Widespread rash. ? Fast heartbeat. ? Feeling dizzy or light-headed. You may receive your blood transfusion in an outpatient setting. If so, you will be told whom to contact to report any reactions. These symptoms may be an emergency. Do not wait to see if the symptoms will go away. Get medical help right away. Call your local emergency services (911 in the [place] Do not drive yourself to the hospital. Summary  Bruising and soreness at the IV site are common.  Check your insertion site every day for signs of infection.  Rest as told by your doctor. Go back to your normal activities as told by your doctor.  Get help right away if you have signs of a serious reaction. This information is not intended to replace advice given to you by your health care provider. Make sure you discuss any questions you have with your health care provider. Document Revised: 11/20/2018 Document Reviewed: 11/20/2018 Elsevier Patient Education  2020 [employer] Darbepoetin Alfa injection What is this medicine? DARBEPOETIN ALFA (dar be POE e tin AL fa) helps your body make more red blood cells. It is used to treat anemia caused by chronic kidney failure and chemotherapy. This medicine may be used for other purposes; ask your health care provider or pharmacist if you have questions.   COMMON BRAND NAME(S): Aranesp What should I tell my health care provider before I take this medicine? They need to know if you have any of these conditions:  blood clotting disorders or history of blood clots  cancer patient not on chemotherapy  cystic fibrosis  heart disease, such as angina, heart failure, or a history of a heart attack  hemoglobin level of 12 g/dL or greater  high blood pressure  low levels of folate, iron, or vitamin B12  seizures  an unusual or  allergic reaction to darbepoetin, erythropoietin, albumin, hamster proteins, latex, other medicines, foods, dyes, or preservatives  pregnant or trying to get pregnant  breast-feeding How should I use this medicine? This medicine is for injection into a vein or under the skin. It is usually given by a health care professional in a hospital or clinic setting. If you get this medicine at home, you will be taught how to prepare and give this medicine. Use exactly as directed. Take your medicine at regular intervals. Do not take your medicine more often than directed. It is important that you put your used needles and syringes in a special sharps container. Do not put them in a trash can. If you do not have a sharps container, call your pharmacist or healthcare provider to get one. A special MedGuide will be given to you by the pharmacist with each prescription and refill. Be sure to read this information carefully each time. Talk to your pediatrician regarding the use of this medicine in children. While this medicine may be used in children as young as 1 month of age for selected conditions, precautions do apply. Overdosage: If you think you have taken too much of this medicine contact a poison control center or emergency room at once. NOTE: This medicine is only for you. Do not share this medicine with others. What if I miss a dose? If you miss a dose, take it as soon as you can. If it is almost time for your next dose, take only that dose. Do not take double or extra doses. What may interact with this medicine? Do not take this medicine with any of the following medications:  epoetin alfa This list may not describe all possible interactions. Give your health care provider a list of all the medicines, herbs, non-prescription drugs, or dietary supplements you use. Also tell them if you smoke, drink alcohol, or use illegal drugs. Some items may interact with your medicine. What should I watch for  while using this medicine? Your condition will be monitored carefully while you are receiving this medicine. You may need blood work done while you are taking this medicine. This medicine may cause a decrease in vitamin B6. You should make sure that you get enough vitamin B6 while you are taking this medicine. Discuss the foods you eat and the vitamins you take with your health care professional. What side effects may I notice from receiving this medicine? Side effects that you should report to your doctor or health care professional as soon as possible:  allergic reactions like skin rash, itching or hives, swelling of the face, lips, or tongue  breathing problems  changes in vision  chest pain  confusion, trouble speaking or understanding  feeling faint or lightheaded, falls  high blood pressure  muscle aches or pains  pain, swelling, warmth in the leg  rapid weight gain  severe headaches  sudden numbness or weakness of the face, arm or leg  trouble walking, dizziness, loss   of balance or coordination  seizures (convulsions)  swelling of the ankles, feet, hands  unusually weak or tired Side effects that usually do not require medical attention (report to your doctor or health care professional if they continue or are bothersome):  diarrhea  fever, chills (flu-like symptoms)  headaches  nausea, vomiting  redness, stinging, or swelling at site where injected This list may not describe all possible side effects. Call your doctor for medical advice about side effects. You may report side effects to FDA at 1-800-FDA-1088. Where should I keep my medicine? Keep out of the reach of children. Store in a refrigerator between 2 and 8 degrees C (36 and 46 degrees F). Do not freeze. Do not shake. Throw away any unused portion if using a single-dose vial. Throw away any unused medicine after the expiration date. NOTE: This sheet is a summary. It may not cover all possible  information. If you have questions about this medicine, talk to your doctor, pharmacist, or health care provider.  2020 Elsevier/Gold Standard (2017-06-12 16:44:20)  

## 2019-12-11 NOTE — Assessment & Plan Note (Addendum)
Patient has had longstanding anemia where the hemoglobin was between 10 to 12 g. Since April 2020 her hemoglobin has gotten worse During the same timeframe her creatinine got from 1.5-2.44   Current treatment:Retacritinjectionsweeklystarted7/31/2020; changed to Aranesp 565m q3 weeks 11/12/2019 Bone marrow biopsy 04/08/2019: Normocellular bone marrow with trilineage hematopoiesis, absent iron stores, flow cytometry no abnormal B or T-cell clonal abnormalities noted.   IV iron therapy:Venofer weekly x4 04/24/19-05/15/19 ______________________________________________________________________________   Right breast cancer: IDC stage IA, ER positive, PR negative, HER-2 negative, s/p Taxotere/Cytoxan x 3 cycles, adjuvant radiation and Exemestane daily since 08/2016.   1. Anemia of chronic kidney disease: will continue aranesp.  Her iron is low.  We will check her iron every 6 weeks.  She needs IV iron.  She will receive this over the next 5 weeks.  I reviewed with her that the Aranesp doesn't work as well if we do not keep her ferritin over 100 per the manufacturers recommendation.  She will receive one unit of blood today.  2. Breast cancer: no sign of recurrence.  She will undergo mammogram in 01/2020 and she will continue exemestane.  Her right breast has some skin thickening, however more consistent with radiation changes, and unchanged since radiation.  She will return in 3 weeks for labs and f/u with Dr. GLindi Adie

## 2019-12-11 NOTE — Progress Notes (Signed)
[facility] Cancer Follow up:    Hannah Ferretti, DO [address] [place] [address]   DIAGNOSIS: Cancer Staging Breast cancer of upper-inner quadrant of right female breast [facility]) Staging form: Breast, AJCC 7th Edition - Clinical stage from 12/21/2015: Stage IA (T1c, N0, M0) - Unsigned Staging comments: Staged at breast conference on 7.12.17 - Pathologic: Stage IA (T1c, N0, cM0) - Unsigned   SUMMARY OF ONCOLOGIC HISTORY: Oncology History  Breast cancer of upper-inner quadrant of right female breast (HCC)  12/14/2015 Initial Diagnosis   Right breast biopsy 12:30 position: 2 masses, 1.6 cm mass: Invasive ductal carcinoma, grade 2, ER 0%, PR 0%, HER-2 negative, Ki-67 70%; satellite mass 8 mm: IDC grade 2 ER 5%, PR 5%, HER-2 negative, Ki-67 50%; T1cN0 stage IA clinical stage   01/18/2016 Surgery   Right lumpectomy: Multifocal IDC grade 3, 1.9 cm  ER 0%, PR 0%, HER-2 negative, Ki-67 70% and 0.8 cm satellite mass ER 5%, PR 2%, HER-2 negative, Ki-67 50%, high-grade DCIS, margins negative, 0/1 lymph nodes negative T1 cN0 stage IA   02/17/2016 - 04/06/2016 Chemotherapy   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    04/08/2016 - 04/23/2016 Hospital Admission   Hosp adm for cellulitis   05/24/2016 - 07/25/2016 Radiation Therapy    Adj XRT   06/29/2016 - 07/04/2016 Hospital Admission   Seizure like activity, MRI brain and EEG unremarkable    08/16/2016 -  Anti-estrogen oral therapy   Letrozole could not tolerate it due to dizziness and lightheadedness, switched to anastrozole 04/04/2017 switched to exemestane 05/22/2017     CURRENT THERAPY: aranesp every 3 weeks, blood transfusions, exemestane  INTERVAL HISTORY: Hannah [name] 74 y.o. female returns for follow up of her h/o breast cancer and her anemia of chronic kidney disease.  She has remained anemic and is receiving aranesp every 3 weeks.  Her last ferritin was 9 months ago, 8  months ago, and 4 weeks ago, all less than 50. Her most recent iron was in 04/2019 and she received Venofer weekly x 4 weeks.  She says she tolerated that well.   Hannah [name] is tired and feels fatigued.  She has DOE.  She feels like she needs a blood transfusion.  She denies any issues with her injections.  Hannah [name] also has h/o breast cancer and is taking Exemestane daily.  She tolerates this well and has no issues with taking this.  Her mammogram is due in 01/2020.     Patient Active Problem List   Diagnosis Date Noted  . Pain in left shoulder 03/26/2019  . History of breast cancer in female 03/26/2019  . Hypokalemia 02/20/2019  . Acute exacerbation of CHF (congestive heart failure) (HCC) 02/19/2019  . Pleural effusion on right 09/25/2018  . Thrombocytopenia (HCC) 09/25/2018  . Cholecystitis with cholelithiasis 07/07/2018  . Prolonged Q-T interval on ECG 05/21/2018  . Atrial fibrillation (HCC) 01/23/2018  . Closed fracture of head of left humerus 09/09/2017  . Syncope 09/06/2017  . Seizure-like activity (HCC) 09/01/2017  . AKI (acute kidney injury) (HCC) 09/01/2017  . Adrenal insufficiency (HCC) 07/03/2016  . Seizure (HCC) 06/29/2016  . Chemotherapy-induced peripheral neuropathy (HCC) 04/27/2016  . 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 Osteomyelitis of toe of left foot (HCC) 04/06/2016  . Mania (HCC) 03/05/2016  . GERD (gastroesophageal reflux disease) 02/29/2016  . Chemotherapy induced nausea and vomiting 02/24/2016  . Port catheter in place 02/17/2016  . Sensorineural hearing loss (SNHL)  of both ears 01/06/2016  . Breast cancer of upper-inner quadrant of right female breast ([place]) 12/16/2015  . Osteopenia of multiple sites 10/19/2015  . CKD (chronic kidney disease), stage IV ([place]) 09/24/2013  . Chronic diastolic CHF (congestive heart failure) ([place]) 09/24/2013  . Acute on chronic combined systolic and diastolic congestive heart failure ([place]) 07/17/2013  . Atrial  tachycardia ([place]) 04/03/2013  . CAD (coronary artery disease) 01/24/2011  . Chronic coronary artery disease 01/24/2011  . History of aortic valve replacement 10/27/2010  . Hyperlipidemia   . DOE (dyspnea on exertion)   . Nonischemic cardiomyopathy ([place])   . Anemia of chronic disease   . Type 2 diabetes mellitus ([place])   . Iron deficiency anemia   . Allergic rhinitis 10/14/2009  . Essential hypertension 05/12/2009  . H/O atrial tachycardia 05/12/2009  . Asthma 05/12/2009    is allergic to amoxicillin, tape, aldactone [spironolactone], imdur [isosorbide dinitrate], arimidex [anastrozole], latex, and tetracycline.  MEDICAL HISTORY: Past Medical History:  Diagnosis Date  . Abnormally small mouth   . Allergic rhinitis 10/14/2009   Qualifier: Diagnosis of  By: Lamonte Sakai MD, Rose Fillers   Overview:  Overview:  Qualifier: Diagnosis of  By: Lamonte Sakai MD, Rose Fillers  Last Assessment & Plan:  Please continue Xyzal and Nasacort as you have been using them  . Anemia   . Asthma 05/12/2009   10/12/2014 p extensive coaching HFA effectiveness =    75% s spacer    Overview:  Overview:  10/12/2014 p extensive coaching HFA effectiveness =    75% s spacer   Last Assessment & Plan:  Please continue Symbicort 2 puffs twice a day. Remember to rinse and gargle after taking this medication. Take albuterol 2 puffs up to every 4 hours if needed for shortness of breath.  Follow with Dr Lamonte Sakai in 6 month  . Bilateral carotid artery stenosis    a. mild 1-39% by duplex 10/2017, due 2021.  . Breast cancer ([place]) 01/18/2016   right breast  . CAD (coronary artery disease) 01/24/2011   a. s/p CABG 1998 with redo 2009.  Marland Kitchen Chemotherapy induced nausea and vomiting 02/24/2016  . Chemotherapy-induced peripheral neuropathy ([place]) 04/27/2016  . 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 Chronic diastolic CHF (congestive heart failure) ([place]) 09/24/2013  . CKD (chronic kidney disease), stage IV ([place]) 09/24/2013  . Closed fracture of head of  left humerus 09/09/2017  . Complication of anesthesia   . Dysrhythmia    a-fib  . Gallstones   . GERD (gastroesophageal reflux disease)   . Glaucoma   . H/O atrial tachycardia 05/12/2009   Qualifier: History of  By: Lamonte Sakai MD, Rose Fillers   Overview:  Overview:  Qualifier: History of  By: Lamonte Sakai MD, Rose Fillers  Last Assessment & Plan:  There was no evidence of this on her cardiac monitor. I would recommend watchful waiting.   Marland Kitchen Heart murmur   . History of kidney stones   . History of non-ST elevation myocardial infarction (NSTEMI)    Sept 2014--  thought to be type II HTN w/ LHC without infarct related artery and patent grafts  . History of radiation therapy 05/24/16-07/26/16   right breast 50.4 Gy in 28 fractions, right breast boost 10 Gy in 5 fractions  . Hyperlipidemia   . Hypotension   . Iron deficiency anemia   . Mania ([place]) 03/05/2016  . Moderate persistent asthma    pulmologist-  Dr. Malvin Johns  . Osteomyelitis of toe of left foot ([place]) 04/06/2016  .  Osteopenia of multiple sites 10/19/2015  . PAF (paroxysmal atrial fibrillation) ([place])   . Personal history of chemotherapy 2017  . Personal history of radiation therapy 2017  . PONV (postoperative nausea and vomiting)   . Port catheter in place 02/17/2016  . Psoriasis    right leg  . Renal calculus, right   . S/P AVR    prosthesis valve placement 2009 at same time re-do CABG  . Seizure-like activity ([place]) 09/01/2017  . Sensorineural hearing loss (SNHL) of both ears 01/06/2016  . Stroke [facility]) 2014   residual rt hearing loss  . Syncope 09/06/2017  . Type 2 diabetes mellitus ([place])    monitored by dr Dwyane Dee    SURGICAL HISTORY: Past Surgical History:  Procedure Laterality Date  . AORTIC VALVE REPLACEMENT  2009   [facility] (done same time is CABG)  . BREAST LUMPECTOMY Right 01/18/2016  . BREAST LUMPECTOMY WITH RADIOACTIVE SEED AND SENTINEL LYMPH NODE BIOPSY Right 01/18/2016   Procedure: RIGHT BREAST LUMPECTOMY  WITH RADIOACTIVE SEED AND SENTINEL LYMPH NODE BIOPSY;  Surgeon: DAlphonsa Overall MD;  [place]  Service: General;  Laterality: Right;  . CARDIAC CATHETERIZATION  03/23/2008   Pre-redo CABG: L main OK, LAD (T), CFX (T), OM1 99%, RCA (T), LIMA-LAD OK, SVG-OM(?3) OK w/ little florw to OM2, SVG-RCA OK. EF NL  . CARPAL TUNNEL RELEASE    . CHEST TUBE INSERTION Right 06/26/2019   Procedure: INSERTION PLEURAL DRAINAGE CATHETER;  Surgeon: BGaye Pollack MD;  [place]  Service: Thoracic;  Laterality: Right;  . CHOLECYSTECTOMY N/A 07/07/2018   Procedure: LAPAROSCOPIC CHOLECYSTECTOMY WITH INTRAOPERATIVE CHOLANGIOGRAM ERAS PATHWAY;  Surgeon: NAlphonsa Overall MD;  [facility];  Service: General;  Laterality: N/A;  . COLONOSCOPY     around 2015. Possibly with [facility]   . CORONARY ARTERY BYPASS GRAFT  1998 &  re-do 2009   Had LIMA to DX/LAD, SVG [facility] 2 marginal [facility] SVG to 3rd OM and PD at time of redo  . CYSTOSCOPY W/ URETERAL STENT PLACEMENT Right 12/20/2014   Procedure: CYSTOSCOPY WITH RETROGRADE PYELOGRAM/URETERAL STENT PLACEMENT;  Surgeon: PCleon Gustin MD;  [facility]  Service: Urology;  Laterality: Right;  . CYSTOSCOPY WITH RETROGRADE PYELOGRAM, URETEROSCOPY AND STENT PLACEMENT Left 08/21/2019   Procedure: CYSTOSCOPY WITH RETROGRADE PYELOGRAM, URETEROSCOPY AND STENT PLACEMENT;  Surgeon: MCleon Gustin MD;  [facility];  Service: Urology;  Laterality: Left;  1 HR  . ESOPHAGOGASTRODUODENOSCOPY     many years ago per patient   . ESOPHAGOGASTRODUODENOSCOPY ENDOSCOPY  06/17/2018  . EYE SURGERY Bilateral    cataracts  . HOLMIUM LASER APPLICATION Right 70/26/3785  Procedure:  HOLMIUM LASER LITHOTRIPSY;  Surgeon: PCleon Gustin MD;  [facility]  Service: Urology;  Laterality: Right;  . HOLMIUM LASER APPLICATION Left 38/85/0277  Procedure: HOLMIUM LASER APPLICATION;  Surgeon: MCleon Gustin  MD;  [facility];  Service: Urology;  Laterality: Left;  . IR THORACENTESIS ASP PLEURAL SPACE W/IMG GUIDE  10/03/2018  . IR THORACENTESIS ASP PLEURAL SPACE W/IMG GUIDE  06/08/2019  . LEFT HEART CATHETERIZATION WITH CORONARY/GRAFT ANGIOGRAM N/A 02/23/2013   Procedure: LEFT HEART CATHETERIZATION WITH CBeatrix Fetters  Surgeon: MBlane Ohara MD;  [facility];  Service: Cardiovascular;  Laterality: N/A;  . LOOP RECORDER INSERTION N/A 08/30/2017   Procedure: LOOP RECORDER INSERTION;  Surgeon: TEvans Lance MD;  [facility];  Service: Cardiovascular;  Laterality: N/A;  . PORTACATH PLACEMENT Left 01/18/2016   Procedure: INSERTION PORT-A-CATH;  Surgeon: Alphonsa Overall, MD;  Location: [place];  Service: General;  Laterality: Left;  . portacath removal    . REMOVAL OF PLEURAL DRAINAGE CATHETER Right 07/14/2019   Procedure: REMOVAL OF PLEURAL DRAINAGE CATHETER;  Surgeon: Gaye Pollack, MD;  Location: [place];  Service: Thoracic;  Laterality: Right;  . TALC PLEURODESIS N/A 06/26/2019   Procedure: Pietro Cassis;  Surgeon: Gaye Pollack, MD;  [facility];  Service: Thoracic;  Laterality: N/A;  . TONSILLECTOMY    . TRANSTHORACIC ECHOCARDIOGRAM  02-24-2013      mild LVH,  ef 50-55%/  AV bioprosthesis was present with very mild stenosis and no regurg., mean grandient 47mHg, peak grandient 279mg /  mild MR/  mild LAE and RAE/  moderate TR  . TUBAL LIGATION      SOCIAL HISTORY: Social History   Socioeconomic History  . Marital status: Married    Spouse name: Not on file  . Number of children: 2  . Years of education: Not on file  . Highest education level: Not on file  Occupational History  . Occupation: retired  Tobacco Use  . Smoking status: Never Smoker  . Smokeless tobacco: Never Used  Vaping Use  . Vaping Use: Never used  Substance and Sexual Activity  . Alcohol use: No  . Drug use: No  . Sexual activity: Yes    Birth control/protection: None   Other Topics Concern  . Not on file  Social History Narrative  . Not on file   Social Determinants of Health   Financial Resource Strain:   . Difficulty of Paying Living Expenses:   Food Insecurity:   . Worried About RuCharity fundraisern the Last Year:   . RaArboriculturistn the Last Year:   Transportation Needs:   . LaFilm/video editorMedical):   . Marland Kitchenack of Transportation (Non-Medical):   Physical Activity:   . Days of Exercise per Week:   . Minutes of Exercise per Session:   Stress:   . Feeling of Stress :   Social Connections:   . Frequency of Communication with Friends and Family:   . Frequency of Social Gatherings with Friends and Family:   . Attends Religious Services:   . Active Member of Clubs or Organizations:   . Attends ClArchivisteetings:   . Marland Kitchenarital Status:   Intimate Partner Violence:   . Fear of Current or Ex-Partner:   . Emotionally Abused:   . Marland Kitchenhysically Abused:   . Sexually Abused:     FAMILY HISTORY: Family History  Problem Relation Age of Onset  . Heart disease Father   . Heart failure Father   . Diabetes Maternal Grandmother   . Heart disease Maternal Grandmother   . Diabetes Son   . Healthy Brother        #1  . Heart attack Brother        #2  . Heart disease Brother        #2  . Colon cancer Neg Hx   . Esophageal cancer Neg Hx     Review of Systems  Constitutional: Positive for fatigue. Negative for appetite change, chills, fever and unexpected weight change.  HENT:   Negative for hearing loss, lump/mass and trouble swallowing.   Eyes: Negative for eye problems and icterus.  Respiratory: Positive for shortness of  breath. Negative for chest tightness and cough.   Cardiovascular: Negative for chest pain, leg swelling and palpitations.  Gastrointestinal: Negative for abdominal distention, abdominal pain, constipation, diarrhea, nausea and vomiting.  Endocrine: Negative for hot flashes.  Musculoskeletal: Negative for  arthralgias.  Skin: Negative for itching and rash.  Neurological: Negative for dizziness, extremity weakness, headaches and numbness.  Hematological: Negative for adenopathy. Does not bruise/bleed easily.  Psychiatric/Behavioral: Negative for confusion, depression and suicidal ideas. The patient is not nervous/anxious.       PHYSICAL EXAMINATION  ECOG PERFORMANCE STATUS: 2 - Symptomatic, <50% confined to bed  Vitals:   12/11/19 1123  BP: (!) 93/36  Pulse: 63  Resp: 17  Temp: 98.5 F (36.9 C)  SpO2: 100%    Physical Exam Constitutional:      General: She is not in acute distress.    Appearance: Normal appearance. She is not toxic-appearing.     Comments: Appears tired  HENT:     Head: Normocephalic and atraumatic.  Eyes:     General: No scleral icterus. Cardiovascular:     Rate and Rhythm: Normal rate and regular rhythm.     Pulses: Normal pulses.     Heart sounds: Normal heart sounds.  Pulmonary:     Effort: Pulmonary effort is normal.     Breath sounds: Normal breath sounds.     Comments: Right breast s/p lumpectomy, no sign of local recurrence, left breast benign Abdominal:     General: Abdomen is flat. Bowel sounds are normal.     Palpations: Abdomen is soft.  Musculoskeletal:        General: No swelling.     Cervical back: Neck supple.  Lymphadenopathy:     Cervical: No cervical adenopathy.  Skin:    Capillary Refill: Capillary refill takes less than 2 seconds.  Neurological:     Mental Status: She is alert.  Psychiatric:        Mood and Affect: Mood normal.        Behavior: Behavior normal.     LABORATORY DATA:  CBC    Component Value Date/Time   WBC 5.2 12/11/2019 1045   WBC 6.7 10/30/2019 2059   RBC 2.76 (L) 12/11/2019 1045   HGB 7.5 (L) 12/11/2019 1045   HGB 11.4 06/02/2019 1436   HGB 8.5 (L) 04/27/2016 1352   HCT 25.9 (L) 12/11/2019 1045   HCT 36.2 06/02/2019 1436   HCT 27.3 (L) 04/27/2016 1352   PLT 126 (L) 12/11/2019 1045   PLT 154  06/02/2019 1436   MCV 93.8 12/11/2019 1045   MCV 93 06/02/2019 1436   MCV 97.5 04/27/2016 1352   MCH 27.2 12/11/2019 1045   MCHC 29.0 (L) 12/11/2019 1045   RDW 16.6 (H) 12/11/2019 1045   RDW 16.7 (H) 06/02/2019 1436   RDW 17.4 (H) 04/27/2016 1352   LYMPHSABS 0.5 (L) 12/11/2019 1045   LYMPHSABS 0.5 (L) 02/23/2019 1221   LYMPHSABS 1.0 04/27/2016 1352   MONOABS 0.6 12/11/2019 1045   MONOABS 0.9 04/27/2016 1352   EOSABS 0.1 12/11/2019 1045   EOSABS 0.2 02/23/2019 1221   BASOSABS 0.0 12/11/2019 1045   BASOSABS 0.0 02/23/2019 1221   BASOSABS 0.1 04/27/2016 1352    CMP     Component Value Date/Time   NA 139 10/30/2019 2059   NA 142 09/22/2019 1152   NA 143 04/27/2016 1352   K 3.6 10/30/2019 2059   K 4.1 04/27/2016 1352   CL 100 10/30/2019 2059   CO2 24  10/30/2019 2059   CO2 29 04/27/2016 1352   GLUCOSE 147 (H) 12/11/2019 1012   GLUCOSE 109 04/27/2016 1352   BUN 42 (H) 10/30/2019 2059   BUN 36 (H) 09/22/2019 1152   BUN 29.5 (H) 04/27/2016 1352   CREATININE 2.59 (H) 10/30/2019 2059   CREATININE 2.10 (H) 01/02/2019 1113   CREATININE 1.66 (H) 05/23/2016 1143   CREATININE 1.3 (H) 04/27/2016 1352   CALCIUM 8.7 (L) 10/30/2019 2059   CALCIUM 8.9 04/27/2016 1352   PROT 6.9 10/30/2019 2059   PROT 5.9 (L) 08/03/2019 1624   PROT 6.0 (L) 04/27/2016 1352   ALBUMIN 3.1 (L) 10/30/2019 2059   ALBUMIN 3.2 (L) 08/03/2019 1624   ALBUMIN 2.7 (L) 04/27/2016 1352   AST 41 10/30/2019 2059   AST 37 01/02/2019 1113   AST 28 04/27/2016 1352   ALT 31 10/30/2019 2059   ALT 25 01/02/2019 1113   ALT 24 04/27/2016 1352   ALKPHOS 63 10/30/2019 2059   ALKPHOS 62 04/27/2016 1352   BILITOT 0.9 10/30/2019 2059   BILITOT 0.4 08/03/2019 1624   BILITOT 0.5 01/02/2019 1113   BILITOT 0.81 04/27/2016 1352   GFRNONAA 18 (L) 10/30/2019 2059   GFRNONAA 23 (L) 01/02/2019 1113   GFRAA 20 (L) 10/30/2019 2059   GFRAA 26 (L) 01/02/2019 1113       PENDING LABS:   RADIOGRAPHIC STUDIES:  No results  found.   PATHOLOGY:     ASSESSMENT and THERAPY PLAN:   Breast cancer of upper-inner quadrant of right female breast ([place])  Patient has had longstanding anemia where the hemoglobin was between 10 to 12 g. Since April 2020 her hemoglobin has gotten worse During the same timeframe her creatinine got from 1.5-2.44   Current treatment:Retacritinjectionsweeklystarted7/31/2020; changed to Aranesp '500mg'$  q3 weeks 11/12/2019 Bone marrow biopsy 04/08/2019: Normocellular bone marrow with trilineage hematopoiesis, absent iron stores, flow cytometry no abnormal B or T-cell clonal abnormalities noted.   IV iron therapy:Venofer weekly x4 04/24/19-05/15/19 ______________________________________________________________________________   Right breast cancer: IDC stage IA, ER positive, PR negative, HER-2 negative, s/p Taxotere/Cytoxan x 3 cycles, adjuvant radiation and Exemestane daily since 08/2016.   1. Anemia of chronic kidney disease: will continue aranesp.  Her iron is low.  We will check her iron every 6 weeks.  She needs IV iron.  She will receive this over the next 5 weeks.  I reviewed with her that the Aranesp doesn't work as well if we do not keep her ferritin over 100 per the manufacturers recommendation.  She will receive one unit of blood today.  2. Breast cancer: no sign of recurrence.  She will undergo mammogram in 01/2020 and she will continue exemestane.  Her right breast has some skin thickening, however more consistent with radiation changes, and unchanged since radiation.  She will return in 3 weeks for labs and f/u with Dr. Lindi Adie.    All questions were answered. The patient knows to call the clinic with any problems, questions or concerns. We can certainly see the patient much sooner if necessary.  Total encounter time: 30 minutes*  Wilber Bihari, NP 12/11/19 5:14 PM Medical Oncology and Hematology [facility] [place], [place]  [address] Tel. [phone]    Fax. [phone]  *Total Encounter Time as defined by the Centers for Medicare and Medicaid Services includes, in addition to the face-to-face time of a patient visit (documented in the note above) non-face-to-face time: obtaining and reviewing outside history, ordering and reviewing medications, tests or  procedures, care coordination (communications with other health care professionals or caregivers) and documentation in the medical record.

## 2019-12-11 NOTE — Patient Instructions (Signed)
Darbepoetin Alfa injection What is this medicine? DARBEPOETIN ALFA (dar be POE e tin AL fa) helps your body make more red blood cells. It is used to treat anemia caused by chronic kidney failure and chemotherapy. This medicine may be used for other purposes; ask your health care provider or pharmacist if you have questions. COMMON BRAND NAME(S): Aranesp What should I tell my health care provider before I take this medicine? They need to know if you have any of these conditions:  blood clotting disorders or history of blood clots  cancer patient not on chemotherapy  cystic fibrosis  heart disease, such as angina, heart failure, or a history of a heart attack  hemoglobin level of 12 g/dL or greater  high blood pressure  low levels of folate, iron, or vitamin B12  seizures  an unusual or allergic reaction to darbepoetin, erythropoietin, albumin, hamster proteins, latex, other medicines, foods, dyes, or preservatives  pregnant or trying to get pregnant  breast-feeding How should I use this medicine? This medicine is for injection into a vein or under the skin. It is usually given by a health care professional in a hospital or clinic setting. If you get this medicine at home, you will be taught how to prepare and give this medicine. Use exactly as directed. Take your medicine at regular intervals. Do not take your medicine more often than directed. It is important that you put your used needles and syringes in a special sharps container. Do not put them in a trash can. If you do not have a sharps container, call your pharmacist or healthcare provider to get one. A special MedGuide will be given to you by the pharmacist with each prescription and refill. Be sure to read this information carefully each time. Talk to your pediatrician regarding the use of this medicine in children. While this medicine may be used in children as young as 1 month of age for selected conditions, precautions do  apply. Overdosage: If you think you have taken too much of this medicine contact a poison control center or emergency room at once. NOTE: This medicine is only for you. Do not share this medicine with others. What if I miss a dose? If you miss a dose, take it as soon as you can. If it is almost time for your next dose, take only that dose. Do not take double or extra doses. What may interact with this medicine? Do not take this medicine with any of the following medications:  epoetin alfa This list may not describe all possible interactions. Give your health care provider a list of all the medicines, herbs, non-prescription drugs, or dietary supplements you use. Also tell them if you smoke, drink alcohol, or use illegal drugs. Some items may interact with your medicine. What should I watch for while using this medicine? Your condition will be monitored carefully while you are receiving this medicine. You may need blood work done while you are taking this medicine. This medicine may cause a decrease in vitamin B6. You should make sure that you get enough vitamin B6 while you are taking this medicine. Discuss the foods you eat and the vitamins you take with your health care professional. What side effects may I notice from receiving this medicine? Side effects that you should report to your doctor or health care professional as soon as possible:  allergic reactions like skin rash, itching or hives, swelling of the face, lips, or tongue  breathing problems  changes in   vision  chest pain  confusion, trouble speaking or understanding  feeling faint or lightheaded, falls  high blood pressure  muscle aches or pains  pain, swelling, warmth in the leg  rapid weight gain  severe headaches  sudden numbness or weakness of the face, arm or leg  trouble walking, dizziness, loss of balance or coordination  seizures (convulsions)  swelling of the ankles, feet, hands  unusually weak or  tired Side effects that usually do not require medical attention (report to your doctor or health care professional if they continue or are bothersome):  diarrhea  fever, chills (flu-like symptoms)  headaches  nausea, vomiting  redness, stinging, or swelling at site where injected This list may not describe all possible side effects. Call your doctor for medical advice about side effects. You may report side effects to FDA at 1-800-FDA-1088. Where should I keep my medicine? Keep out of the reach of children. Store in a refrigerator between 2 and 8 degrees C (36 and 46 degrees F). Do not freeze. Do not shake. Throw away any unused portion if using a single-dose vial. Throw away any unused medicine after the expiration date. NOTE: This sheet is a summary. It may not cover all possible information. If you have questions about this medicine, talk to your doctor, pharmacist, or health care provider.  2020 Elsevier/Gold Standard (2017-06-12 16:44:20)  

## 2019-12-12 LAB — FRUCTOSAMINE: Fructosamine: 241 umol/L (ref 0–285)

## 2019-12-13 LAB — BPAM RBC
Blood Product Expiration Date: 202107292359
ISSUE DATE / TIME: 202107021353
Unit Type and Rh: 5100

## 2019-12-13 LAB — TYPE AND SCREEN
ABO/RH(D): O POS
Antibody Screen: NEGATIVE
Unit division: 0

## 2019-12-15 ENCOUNTER — Other Ambulatory Visit: Payer: Medicare Other

## 2019-12-17 ENCOUNTER — Ambulatory Visit: Payer: Medicare Other

## 2019-12-17 ENCOUNTER — Other Ambulatory Visit: Payer: Medicare Other

## 2019-12-18 ENCOUNTER — Ambulatory Visit (INDEPENDENT_AMBULATORY_CARE_PROVIDER_SITE_OTHER): Payer: Medicare Other | Admitting: Endocrinology

## 2019-12-18 ENCOUNTER — Encounter: Payer: Self-pay | Admitting: Endocrinology

## 2019-12-18 ENCOUNTER — Other Ambulatory Visit: Payer: Self-pay

## 2019-12-18 ENCOUNTER — Inpatient Hospital Stay: Payer: Medicare Other

## 2019-12-18 ENCOUNTER — Other Ambulatory Visit: Payer: Medicare Other

## 2019-12-18 ENCOUNTER — Ambulatory Visit: Payer: Medicare Other

## 2019-12-18 VITALS — BP 141/50 | HR 79 | Temp 98.5°F | Resp 18

## 2019-12-18 VITALS — BP 140/38 | HR 64 | Ht 64.0 in | Wt 152.0 lb

## 2019-12-18 DIAGNOSIS — E1165 Type 2 diabetes mellitus with hyperglycemia: Secondary | ICD-10-CM | POA: Diagnosis not present

## 2019-12-18 DIAGNOSIS — E1142 Type 2 diabetes mellitus with diabetic polyneuropathy: Secondary | ICD-10-CM | POA: Diagnosis not present

## 2019-12-18 DIAGNOSIS — D638 Anemia in other chronic diseases classified elsewhere: Secondary | ICD-10-CM

## 2019-12-18 DIAGNOSIS — I129 Hypertensive chronic kidney disease with stage 1 through stage 4 chronic kidney disease, or unspecified chronic kidney disease: Secondary | ICD-10-CM | POA: Diagnosis not present

## 2019-12-18 DIAGNOSIS — N289 Disorder of kidney and ureter, unspecified: Secondary | ICD-10-CM

## 2019-12-18 DIAGNOSIS — Z794 Long term (current) use of insulin: Secondary | ICD-10-CM

## 2019-12-18 MED ORDER — SODIUM CHLORIDE 0.9 % IV SOLN
Freq: Once | INTRAVENOUS | Status: AC
  Filled 2019-12-18: qty 250

## 2019-12-18 MED ORDER — SODIUM CHLORIDE 0.9 % IV SOLN
200.0000 mg | Freq: Once | INTRAVENOUS | Status: AC
  Administered 2019-12-18: 200 mg via INTRAVENOUS
  Filled 2019-12-18: qty 200

## 2019-12-18 MED ORDER — IRON SUCROSE 20 MG/ML IV SOLN
200.0000 mg | Freq: Once | INTRAVENOUS | Status: DC
  Filled 2019-12-18: qty 10

## 2019-12-18 MED ORDER — SODIUM CHLORIDE 0.9 % IV SOLN: 200.0000 mg | Freq: Once | INTRAVENOUS | Status: DC

## 2019-12-18 NOTE — Progress Notes (Signed)
Patient ID: Hannah [name], female   DOB: Mar 11, 1946, 74 y.o.   MRN: [id number]           Reason for Appointment: Follow-up for Type 2 Diabetes   History of Present Illness:          Date of diagnosis of type 2 diabetes mellitus: Late 1980s        Background history:  She was previously treated with metformin prior to starting insulin She thinks she has been on insulin for at least 10 years and mostly taking Lantus and other types of insulin with it Her metformin was stopped when she started insulin and also has had renal dysfunction Previously her A1c had been consistently over 8% and occasionally higher.  She had been on the V-go pump since 03/2015, this was stopped in 3/20  Recent history:   INSULIN regimen is described as:  Lantus 18 units in am, Humalog 4-6 units before meals  Non-insulin hypoglycemic drugs the patient is taking are: Trulicity 1.5 mg weekly   Current diabetes management, blood sugar patterns and problems identified:   Her A1c is usually lower than expected, last 6.1 compared to 7.1, does also have anemia  Fructosamine 241 previously 276   She is here for follow-up with starting Trulicity about 2 months ago  Her blood sugars are fluctuating less and has less tendency to significantly higher sugars  Weight is down 3 pounds  Although she has about the same average blood sugar on her meter her fructosamine indicates better control  She still has some tendency to high readings after meals but this is variable  Occasionally will have somewhat late mealtime insulin doses of NovoLog and occasionally forgetting or not taking when not at home  However not clear why her morning sugars are fluctuating but on an average still about the same  She thinks she is mostly trying to take the NovoLog based on 1 unit per 15 g of carbohydrates although is not always counting carbs  Dinner 6-7 PM usually  Side effects from medications have been: None  Compliance  with the medical regimen: Fair  Glucose monitoring:  done about 2-3 times a day         Glucometer:  Accu-Chek Aviva  Blood Glucose readings by download of her meter for the last 4 weeks:   PRE-MEAL Fasting Lunch  6-9 PM Bedtime Overall  Glucose range:  121-234  114-199  117-262 129-296   Mean/median:  148   193  172 170   Previous data:  PRE-MEAL Fasting Lunch Dinner Bedtime Overall  Glucose range:  106-193  79-211  154-343  81-312   Mean/median:  140    176 172   POST-MEAL PC Breakfast PC Lunch PC Dinner  Glucose range:    63-211  Mean/median:    136     Self-care: The diet that the patient has been following is: tries to limit fats and carbs.  She uses diet green tea or diet drinks and no sugar in drinks    Lunch 1-2 pm Dinner 6-7 PM  Typical meal intake: Breakfast is oatrmeal or cheese toast              Dietician visit, most recent: 5/16 And in 7/16 with oncology dietitian    Weight history:  Wt Readings from Last 3 Encounters:  12/18/19 152 lb (68.9 kg)  12/11/19 147 lb 4.8 oz (66.8 kg)  12/08/19 150 lb (68 kg)    Glycemic control:  Lab Results  Component Value Date   HGBA1C 6.1 (H) 08/13/2019   HGBA1C 7.1 (H) 07/02/2019   HGBA1C 5.6 02/09/2019   Lab Results  Component Value Date   MICROALBUR 14.2 (H) 07/02/2019   LDLCALC 53 12/02/2018   CREATININE 2.59 (H) 10/30/2019   Lab Results  Component Value Date   FRUCTOSAMINE 241 12/11/2019   FRUCTOSAMINE 276 07/02/2019   FRUCTOSAMINE 276 10/11/2017        Allergies as of 12/18/2019      Reactions   Amoxicillin Rash, Other (See Comments)   Tolerates Cephalosporins Has patient had a PCN reaction causing immediate rash, facial/tongue/throat swelling, SOB or lightheadedness with hypotension: Yes Has patient had a PCN reaction causing severe rash involving mucus membranes or skin necrosis: Yes Has patient had a PCN reaction that required hospitalization No Has patient had a PCN reaction occurring  within the last 10 years: No If all of the above answers are "NO", then may proceed with Cephalosporin use.   Tape Other (See Comments)   Must use paper tape   Aldactone [spironolactone] Other (See Comments)   CKD/hypokalemia   Imdur [isosorbide Dinitrate] Other (See Comments)   Headache/severe hypotension/Syncope   Arimidex [anastrozole] Nausea Only   Latex Itching, Other (See Comments)   (Dentist office)   Tetracycline Rash      Medication List       Accurate as of December 18, 2019 11:59 PM. If you have any questions, ask your nurse or doctor.        Accu-Chek Guide Me w/Device Kit Use accu chek meter to check blood sugar three times daily.   Accu-Chek Guide test strip Generic drug: glucose blood Use to test blood sugar 3 times daily   acetaminophen 325 MG tablet Commonly known as: TYLENOL Take 2 tablets (650 mg total) by mouth every 6 (six) hours as needed for mild pain (or Fever >/= 101).   albuterol 108 (90 Base) MCG/ACT inhaler Commonly known as: VENTOLIN HFA Inhale 2 puffs into the lungs every 6 (six) hours as needed for wheezing or shortness of breath.   amiodarone 200 MG tablet Commonly known as: PACERONE Take 1 tablet (200 mg total) by mouth daily.   BD Pen Needle Nano U/F 32G X 4 MM Misc Generic drug: Insulin Pen Needle USE AS INSTRUCTED TO INJECT INSULIN 4 TIMES DAILY.   budesonide-formoterol 160-4.5 MCG/ACT inhaler Commonly known as: SYMBICORT Inhale 2 puffs into the lungs 2 (two) times daily.   Eliquis 5 MG Tabs tablet Generic drug: apixaban Take 1 tablet (5 mg total) by mouth 2 (two) times daily.   exemestane 25 MG tablet Commonly known as: AROMASIN TAKE ONE TABLET BY MOUTH DAILY AFTER BREAKFAST   ezetimibe 10 MG tablet Commonly known as: ZETIA TAKE ONE TABLET BY MOUTH EVERY DAY   famotidine 20 MG tablet Commonly known as: PEPCID Take 20 mg by mouth 2 (two) times daily.   furosemide 20 MG tablet Commonly known as: LASIX Take 20 mg by mouth  daily as needed.   HUMALOG KWIKPEN [place] Inject 6-8 Units into the skin 3 (three) times daily.   HYDROcodone-acetaminophen 5-325 MG tablet Commonly known as: NORCO/VICODIN Take 1-2 tablets by mouth every 6 (six) hours as needed.   insulin glargine 100 UNIT/ML injection Commonly known as: LANTUS Inject 18 Units into the skin daily.   Lantus SoloStar 100 UNIT/ML Solostar Pen Generic drug: insulin glargine INJECT SUBCUTANEOUSLY 16  UNITS DAILY IN THE MORNING   isosorbide mononitrate 30 MG  24 hr tablet Commonly known as: IMDUR Take 1 tablet (30 mg total) by mouth daily.   loratadine 10 MG tablet Commonly known as: CLARITIN Take 10 mg by mouth daily.   metoprolol tartrate 25 MG tablet Commonly known as: LOPRESSOR TAKE 1 TABLET BY MOUTH  TWICE DAILY   midodrine 2.5 MG tablet Commonly known as: PROAMATINE Take 2.5 mg by mouth 3 (three) times daily with meals.   nitroGLYCERIN 0.4 MG SL tablet Commonly known as: NITROSTAT Place 1 tablet (0.4 mg total) under the tongue every 5 (five) minutes as needed for chest pain.   ondansetron 4 MG disintegrating tablet Commonly known as: ZOFRAN-ODT Take 1 tablet (4 mg total) by mouth every 8 (eight) hours as needed for nausea or vomiting.   rosuvastatin 40 MG tablet Commonly known as: CRESTOR Take 1 tablet (40 mg total) by mouth at bedtime.   Trulicity 1.5 KJ/1.7HX Sopn Generic drug: Dulaglutide Inject 1.5 mg into the skin once a week.   zolpidem 5 MG tablet Commonly known as: AMBIEN Take 5 mg by mouth at bedtime.       Allergies:  Allergies  Allergen Reactions   Amoxicillin Rash and Other (See Comments)    Tolerates Cephalosporins Has patient had a PCN reaction causing immediate rash, facial/tongue/throat swelling, SOB or lightheadedness with hypotension: Yes Has patient had a PCN reaction causing severe rash involving mucus membranes or skin necrosis: Yes Has patient had a PCN reaction that required hospitalization No Has  patient had a PCN reaction occurring within the last 10 years: No If all of the above answers are "NO", then may proceed with Cephalosporin use.    Tape Other (See Comments)    Must use paper tape   Aldactone [Spironolactone] Other (See Comments)    CKD/hypokalemia   Imdur [Isosorbide Dinitrate] Other (See Comments)    Headache/severe hypotension/Syncope   Arimidex [Anastrozole] Nausea Only   Latex Itching and Other (See Comments)    (Dentist office)   Tetracycline Rash    Past Medical History:  Diagnosis Date   Abnormally small mouth    Allergic rhinitis 10/14/2009   Qualifier: Diagnosis of  By: Lamonte Sakai MD, Rose Fillers   Overview:  Overview:  Qualifier: Diagnosis of  By: Lamonte Sakai MDRose Fillers  Last Assessment & Plan:  Please continue Xyzal and Nasacort as you have been using them   Anemia    Asthma 05/12/2009   10/12/2014 p extensive coaching HFA effectiveness =    75% s spacer    Overview:  Overview:  10/12/2014 p extensive coaching HFA effectiveness =    75% s spacer   Last Assessment & Plan:  Please continue Symbicort 2 puffs twice a day. Remember to rinse and gargle after taking this medication. Take albuterol 2 puffs up to every 4 hours if needed for shortness of breath.  Follow with Dr Lamonte Sakai in 6 month   Bilateral carotid artery stenosis    a. mild 1-39% by duplex 10/2017, due 2021.   Breast cancer ([place]) 01/18/2016   right breast   CAD (coronary artery disease) 01/24/2011   a. s/p CABG 1998 with redo 2009.   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   Chemotherapy-induced thrombocytopenia 04/06/2016   Chronic diastolic CHF (congestive heart failure) ([place]) 09/24/2013   CKD (chronic kidney disease), stage IV ([place]) 09/24/2013   Closed fracture of head of left humerus 5/0/5697   Complication of anesthesia    Dysrhythmia  a-fib   Gallstones    GERD (gastroesophageal reflux disease)    Glaucoma      H/O atrial tachycardia 05/12/2009   Qualifier: History of  By: Lamonte Sakai MD, Rose Fillers   Overview:  Overview:  Qualifier: History of  By: Lamonte Sakai MD, Rose Fillers  Last Assessment & Plan:  There was no evidence of this on her cardiac monitor. I would recommend watchful waiting.    Heart murmur    History of kidney stones    History of non-ST elevation myocardial infarction (NSTEMI)    Sept 2014--  thought to be type II HTN w/ LHC without infarct related artery and patent grafts   History of radiation therapy 05/24/16-07/26/16   right breast 50.4 Gy in 28 fractions, right breast boost 10 Gy in 5 fractions   Hyperlipidemia    Hypotension    Iron deficiency anemia    Mania ([place]) 03/05/2016   Moderate persistent asthma    pulmologist-  Dr. Malvin Johns   Osteomyelitis of toe of left foot ([place]) 04/06/2016   Osteopenia of multiple sites 10/19/2015   PAF (paroxysmal atrial fibrillation) ([place])    Personal history of chemotherapy 2017   Personal history of radiation therapy 2017   PONV (postoperative nausea and vomiting)    Port catheter in place 02/17/2016   Psoriasis    right leg   Renal calculus, right    S/P AVR    prosthesis valve placement 2009 at same time re-do CABG   Seizure-like activity ([place]) 09/01/2017   Sensorineural hearing loss (SNHL) of both ears 01/06/2016   Stroke ([place]) 2014   residual rt hearing loss   Syncope 09/06/2017   Type 2 diabetes mellitus ([place])    monitored by dr Dwyane Dee    Past Surgical History:  Procedure Laterality Date   AORTIC VALVE REPLACEMENT  2009   [facility] (done same time is CABG)   BREAST LUMPECTOMY Right 01/18/2016   BREAST LUMPECTOMY WITH RADIOACTIVE SEED AND SENTINEL LYMPH NODE BIOPSY Right 01/18/2016   Procedure: RIGHT BREAST LUMPECTOMY WITH RADIOACTIVE SEED AND SENTINEL LYMPH NODE BIOPSY;  Surgeon: DAlphonsa Overall MD;  [facility]  Service: General;  Laterality: Right;   CARDIAC CATHETERIZATION   03/23/2008   Pre-redo CABG: L main OK, LAD (T), CFX (T), OM1 99%, RCA (T), LIMA-LAD OK, SVG-OM(?3) OK w/ little florw to OM2, SVG-RCA OK. EF NL   CARPAL TUNNEL RELEASE     CHEST TUBE INSERTION Right 06/26/2019   Procedure: INSERTION PLEURAL DRAINAGE CATHETER;  Surgeon: BGaye Pollack MD;  [facility];  Service: Thoracic;  Laterality: Right;   CHOLECYSTECTOMY N/A 07/07/2018   Procedure: LAPAROSCOPIC CHOLECYSTECTOMY WITH INTRAOPERATIVE CHOLANGIOGRAM ERAS PATHWAY;  Surgeon: NAlphonsa Overall MD;  [facility];  Service: General;  Laterality: N/A;   COLONOSCOPY     around 2015. Possibly with [place]&  re-do 2009   Had LIMA to DX/LAD, SVG to 2 marginal branches and [facility]; SVG to 3rd OM and PD at time of redo   CYSTOSCOPY W/ URETERAL STENT PLACEMENT Right 12/20/2014   Procedure: CYSTOSCOPY WITH RETROGRADE PYELOGRAM/URETERAL STENT PLACEMENT;  Surgeon: PCleon Gustin MD;  [facility]  Service: Urology;  Laterality: Right;   CYSTOSCOPY WITH RETROGRADE PYELOGRAM, URETEROSCOPY AND STENT PLACEMENT Left 08/21/2019   Procedure: CYSTOSCOPY WITH RETROGRADE PYELOGRAM, URETEROSCOPY AND STENT PLACEMENT;  Surgeon: MCleon Gustin MD;  [facility];  Service: Urology;  Laterality: Left;  1 HR   ESOPHAGOGASTRODUODENOSCOPY     many years ago per patient    ESOPHAGOGASTRODUODENOSCOPY ENDOSCOPY  06/17/2018   EYE SURGERY Bilateral    cataracts   HOLMIUM LASER APPLICATION Right 09/11/4740   Procedure:  HOLMIUM LASER LITHOTRIPSY;  Surgeon: Cleon Gustin, MD;  Location[facility];  Service: Urology;  Laterality: Right;   HOLMIUM LASER APPLICATION Left 5/95/6387   Procedure: HOLMIUM LASER APPLICATION;  Surgeon: Cleon Gustin, MD;  [facility];  Service: Urology;  Laterality: Left;   IR THORACENTESIS ASP PLEURAL SPACE W/IMG GUIDE  10/03/2018   IR THORACENTESIS ASP PLEURAL SPACE W/IMG  GUIDE  06/08/2019   LEFT HEART CATHETERIZATION WITH CORONARY/GRAFT ANGIOGRAM N/A 02/23/2013   Procedure: LEFT HEART CATHETERIZATION WITH Beatrix Fetters;  Surgeon: Blane Ohara, MD;  Location: [facility] CATH LAB;  Service: Cardiovascular;  Laterality: N/A;   LOOP RECORDER INSERTION N/A 08/30/2017   Procedure: LOOP RECORDER INSERTION;  Surgeon: Evans Lance, MD;  Location: [place] CV LAB;  Service: Cardiovascular;  Laterality: N/A;   PORTACATH PLACEMENT Left 01/18/2016   Procedure: INSERTION PORT-A-CATH;  Surgeon: Alphonsa Overall, MD;  Location: [place];  Service: General;  Laterality: Left;   portacath removal     REMOVAL OF PLEURAL DRAINAGE CATHETER Right 07/14/2019   Procedure: REMOVAL OF PLEURAL DRAINAGE CATHETER;  Surgeon: Gaye Pollack, MD;  [facility];  Service: Thoracic;  Laterality: Right;   TALC PLEURODESIS N/A 06/26/2019   Procedure: Pietro Cassis;  Surgeon: Gaye Pollack, MD;  Location[facility];  Service: Thoracic;  Laterality: N/A;   TONSILLECTOMY     TRANSTHORACIC ECHOCARDIOGRAM  02-24-2013      mild LVH,  ef 50-55%/  AV bioprosthesis was present with very mild stenosis and no regurg., mean grandient 8mHg, peak grandient 277mg /  mild MR/  mild LAE and RAE/  moderate TR   TUBAL LIGATION      Family History  Problem Relation Age of Onset   Heart disease Father    Heart failure Father    Diabetes Maternal Grandmother    Heart disease Maternal Grandmother    Diabetes Son    Healthy Brother        #1   Heart attack Brother        #2   Heart disease Brother        #2   Colon cancer Neg Hx    Esophageal cancer Neg Hx     Social History:  reports that she has never smoked. She has never used smokeless tobacco. She reports that she does not drink alcohol and does not use drugs.    Review of Systems    Lipid history: On 40 mg Crestor along with Zetia      Lab Results  Component Value Date   CHOL 106 12/02/2018   HDL 34 (L) 12/02/2018     LDLCALC 53 12/02/2018   LDLDIRECT 53.0 10/08/2016   TRIG 97 12/02/2018   CHOLHDL 3.1 12/02/2018           Eyes:  No history of retinopathy  She is followed by nephrologist for renal dysfunction which shows variable results   Lab Results  Component Value Date   CREATININE 2.59 (H) 10/30/2019   CREATININE 2.07 (H) 09/22/2019   CREATININE 2.30 (H) 09/15/2019    Diabetic foot exam in 3/20: She has significant loss of monofilament sensation in her distal feet She thinks her neuropathy  started after her chemotherapy She has some difficulty with balance  BLOOD pressure: She has variable blood pressure readings  BP Readings from Last 3 Encounters:  12/18/19 (!) 140/38  12/18/19 (!) 141/50  12/11/19 (!) 157/42   She is on amiodarone 200 mg, thyroid levels have been normal  Lab Results  Component Value Date   TSH 3.090 08/03/2019      Physical Examination:  BP (!) 140/38 (BP Location: Left Arm, Patient Position: Sitting, Cuff Size: Normal)    Pulse 64    Ht 5' 4" (1.626 m)    Wt 152 lb (68.9 kg)    LMP  (LMP Unknown)    SpO2 98%    BMI 26.09 kg/m     ASSESSMENT:  Diabetes type 2, on insulin pump    See history of present illness for detailed discussion of  current management, blood sugar patterns and problems identified  Blood sugar prior readings, readings and averages reviewed from her meter and compared to previous data  A1c is last 6.1 but fructosamine is indicating relatively better control with adding Trulicity Her average blood sugar at home recently is about the same at 170  Weight is down 3 pounds  Blood sugars are fluctuating somewhat less compared to her last visit and has fewer extreme readings above 250 Mealtimes, timing of her mealtime insulin also playing a role in her blood sugar fluctuation after meals Fasting readings are still relatively high   Chronic amiodarone therapy: TSH to be monitored periodically    PLAN:    She will increase  her Humalog by taking 1 unit for every 10 g of carbohydrate, may need to take as much as 10 units Lantus 20 units daily instead of 18 and may need to increase further to get most of her readings below 140 at least Encourage her to be as active as possible Avoid high carbohydrate meals More consistent compliance with insulin before meals for the Humalog Continue 1.5 mg Trulicity, may consider increasing the dose on next visit if postprandial readings still high  Patient Instructions  Lantus 20 units  Humalog: divide carbs by [place] 12/19/2019, 5:54 PM   Note: This office note was prepared with Dragon voice recognition system technology. Any transcriptional errors that result from this process are unintentional.

## 2019-12-18 NOTE — Patient Instructions (Signed)

## 2019-12-18 NOTE — Patient Instructions (Addendum)
Lantus 20 units  Humalog: divide carbs by 10

## 2019-12-21 ENCOUNTER — Telehealth: Payer: Self-pay | Admitting: *Deleted

## 2019-12-21 NOTE — Telephone Encounter (Signed)
S/w pt is aware husbands appt was moved to the same day as pts.  Confirmed time and date.

## 2019-12-21 NOTE — Telephone Encounter (Signed)
-----   Message from Johnna Acosta sent at 12/17/2019 11:54 AM EDT ----- Regarding: Appt Request Hi Mashelle Busick, I just got a call from this patient. She wanted to make her Husband Averil Digman (MRN: [id number]) an appointment with [name] on the same day as hers. She was frustrated because [name] only had a TOC slot the same day as her appointment and I could not put her Husband in that slot. She said she was going to call or message you personally because "Andee Poles does it all the time".  I tried to move both of their appointments to a day where [name] had two slots open but the wife was getting worked up over having to change them both.  I just waned to give you a heads up  [place]

## 2019-12-22 ENCOUNTER — Telehealth: Payer: Self-pay | Admitting: *Deleted

## 2019-12-22 ENCOUNTER — Encounter: Payer: Self-pay | Admitting: Adult Health

## 2019-12-22 MED ORDER — METOPROLOL TARTRATE 25 MG PO TABS: 25.0000 mg | ORAL_TABLET | Freq: Two times a day (BID) | ORAL | 3 refills | Status: DC

## 2019-12-22 MED ORDER — NITROGLYCERIN 0.4 MG SL SUBL: 0.4000 mg | SUBLINGUAL_TABLET | SUBLINGUAL | 3 refills | Status: DC | PRN

## 2019-12-22 NOTE — Telephone Encounter (Signed)
S/w pt is aware of recommendations.  Medication list updated.

## 2019-12-22 NOTE — Telephone Encounter (Signed)
Ok to take an extra dose of her Lopressor if needed for the palpitations - or take her routine dose earlier.  [name]

## 2019-12-22 NOTE — Telephone Encounter (Signed)
Pt calling in today due to having episode last night from 10-2 with no chest pain, heart was racing and pt felt a lot of anxiety.  Pt did not press loop recorder due to pt stated "never shows anything". Pt also needed a refill on nitro, sent in today to requested pharmacy. Pt did want something for anxiety and stated pt would have to contact PCP.    Pt is going on vacation and did not want to have to go through this on vacation. Will send to [name] to advise.

## 2019-12-24 ENCOUNTER — Ambulatory Visit: Payer: Medicare Other

## 2019-12-24 ENCOUNTER — Other Ambulatory Visit: Payer: Medicare Other

## 2019-12-25 ENCOUNTER — Other Ambulatory Visit: Payer: Self-pay

## 2019-12-25 ENCOUNTER — Other Ambulatory Visit: Payer: Medicare Other

## 2019-12-25 ENCOUNTER — Ambulatory Visit: Payer: Medicare Other

## 2019-12-25 ENCOUNTER — Telehealth: Payer: Self-pay | Admitting: Adult Health

## 2019-12-25 ENCOUNTER — Inpatient Hospital Stay: Payer: Medicare Other

## 2019-12-25 VITALS — BP 156/44 | HR 61 | Temp 98.2°F | Resp 17

## 2019-12-25 DIAGNOSIS — I129 Hypertensive chronic kidney disease with stage 1 through stage 4 chronic kidney disease, or unspecified chronic kidney disease: Secondary | ICD-10-CM | POA: Diagnosis not present

## 2019-12-25 DIAGNOSIS — D638 Anemia in other chronic diseases classified elsewhere: Secondary | ICD-10-CM

## 2019-12-25 MED ORDER — SODIUM CHLORIDE 0.9 % IV SOLN
200.0000 mg | Freq: Once | INTRAVENOUS | Status: AC
  Administered 2019-12-25: 200 mg via INTRAVENOUS
  Filled 2019-12-25: qty 200

## 2019-12-25 MED ORDER — SODIUM CHLORIDE 0.9 % IV SOLN
Freq: Once | INTRAVENOUS | Status: AC
  Filled 2019-12-25: qty 250

## 2019-12-25 NOTE — Telephone Encounter (Signed)
Called per 7/16 sch msg - no answer. Left message for patient to call back to reschedule appt.

## 2019-12-25 NOTE — Patient Instructions (Signed)

## 2019-12-31 ENCOUNTER — Telehealth: Payer: Self-pay

## 2019-12-31 ENCOUNTER — Inpatient Hospital Stay (HOSPITAL_BASED_OUTPATIENT_CLINIC_OR_DEPARTMENT_OTHER): Payer: Medicare Other | Admitting: Adult Health

## 2019-12-31 ENCOUNTER — Inpatient Hospital Stay: Payer: Medicare Other

## 2019-12-31 ENCOUNTER — Other Ambulatory Visit: Payer: Self-pay

## 2019-12-31 ENCOUNTER — Other Ambulatory Visit: Payer: Self-pay | Admitting: Adult Health

## 2019-12-31 VITALS — BP 113/57 | HR 63 | Temp 98.3°F | Resp 18

## 2019-12-31 VITALS — BP 124/45 | HR 64 | Temp 98.9°F | Resp 17 | Ht 64.0 in | Wt 143.3 lb

## 2019-12-31 DIAGNOSIS — D638 Anemia in other chronic diseases classified elsewhere: Secondary | ICD-10-CM

## 2019-12-31 DIAGNOSIS — Z170 Estrogen receptor positive status [ER+]: Secondary | ICD-10-CM

## 2019-12-31 DIAGNOSIS — D649 Anemia, unspecified: Secondary | ICD-10-CM | POA: Diagnosis not present

## 2019-12-31 DIAGNOSIS — C50211 Malignant neoplasm of upper-inner quadrant of right female breast: Secondary | ICD-10-CM

## 2019-12-31 DIAGNOSIS — I129 Hypertensive chronic kidney disease with stage 1 through stage 4 chronic kidney disease, or unspecified chronic kidney disease: Secondary | ICD-10-CM | POA: Diagnosis not present

## 2019-12-31 DIAGNOSIS — Z9889 Other specified postprocedural states: Secondary | ICD-10-CM

## 2019-12-31 LAB — CBC WITH DIFFERENTIAL (CANCER CENTER ONLY)
Abs Immature Granulocytes: 0.01 10*3/uL (ref 0.00–0.07)
Basophils Absolute: 0.1 10*3/uL (ref 0.0–0.1)
Basophils Relative: 1 %
Eosinophils Absolute: 0.3 10*3/uL (ref 0.0–0.5)
Eosinophils Relative: 6 %
HCT: 26.3 % — ABNORMAL LOW (ref 36.0–46.0)
Hemoglobin: 7.8 g/dL — ABNORMAL LOW (ref 12.0–15.0)
Immature Granulocytes: 0 %
Lymphocytes Relative: 10 %
Lymphs Abs: 0.5 10*3/uL — ABNORMAL LOW (ref 0.7–4.0)
MCH: 28.5 pg (ref 26.0–34.0)
MCHC: 29.7 g/dL — ABNORMAL LOW (ref 30.0–36.0)
MCV: 96 fL (ref 80.0–100.0)
Monocytes Absolute: 0.5 10*3/uL (ref 0.1–1.0)
Monocytes Relative: 8 %
Neutro Abs: 4.2 10*3/uL (ref 1.7–7.7)
Neutrophils Relative %: 75 %
Platelet Count: 118 10*3/uL — ABNORMAL LOW (ref 150–400)
RBC: 2.74 MIL/uL — ABNORMAL LOW (ref 3.87–5.11)
RDW: 17.5 % — ABNORMAL HIGH (ref 11.5–15.5)
WBC Count: 5.5 10*3/uL (ref 4.0–10.5)
nRBC: 0 % (ref 0.0–0.2)

## 2019-12-31 LAB — CMP (CANCER CENTER ONLY)
ALT: 32 U/L (ref 0–44)
AST: 55 U/L — ABNORMAL HIGH (ref 15–41)
Albumin: 2.8 g/dL — ABNORMAL LOW (ref 3.5–5.0)
Alkaline Phosphatase: 58 U/L (ref 38–126)
Anion gap: 9 (ref 5–15)
BUN: 46 mg/dL — ABNORMAL HIGH (ref 8–23)
CO2: 28 mmol/L (ref 22–32)
Calcium: 8.4 mg/dL — ABNORMAL LOW (ref 8.9–10.3)
Chloride: 101 mmol/L (ref 98–111)
Creatinine: 2.5 mg/dL — ABNORMAL HIGH (ref 0.44–1.00)
GFR, Est AFR Am: 21 mL/min — ABNORMAL LOW (ref >60)
GFR, Estimated: 18 mL/min — ABNORMAL LOW (ref >60)
Glucose, Bld: 289 mg/dL — ABNORMAL HIGH (ref 70–99)
Potassium: 4.9 mmol/L (ref 3.5–5.1)
Sodium: 138 mmol/L (ref 135–145)
Total Bilirubin: 0.6 mg/dL (ref 0.3–1.2)
Total Protein: 6 g/dL — ABNORMAL LOW (ref 6.5–8.1)

## 2019-12-31 LAB — PREPARE RBC (CROSSMATCH)

## 2019-12-31 LAB — RETIC PANEL
Immature Retic Fract: 9.4 % (ref 2.3–15.9)
RBC.: 2.76 MIL/uL — ABNORMAL LOW (ref 3.87–5.11)
Retic Count, Absolute: 48.6 10*3/uL (ref 19.0–186.0)
Retic Ct Pct: 1.8 % (ref 0.4–3.1)
Reticulocyte Hemoglobin: 36 pg (ref >27.9)

## 2019-12-31 LAB — SAMPLE TO BLOOD BANK

## 2019-12-31 MED ORDER — SODIUM CHLORIDE 0.9 % IV SOLN
200.0000 mg | Freq: Once | INTRAVENOUS | Status: AC
  Administered 2019-12-31: 200 mg via INTRAVENOUS
  Filled 2019-12-31: qty 200

## 2019-12-31 MED ORDER — DARBEPOETIN ALFA 500 MCG/ML IJ SOSY: 500.0000 ug | PREFILLED_SYRINGE | Freq: Once | INTRAMUSCULAR | Status: DC

## 2019-12-31 MED ORDER — DARBEPOETIN ALFA 500 MCG/ML IJ SOSY
PREFILLED_SYRINGE | INTRAMUSCULAR | Status: AC
  Filled 2019-12-31: qty 1

## 2019-12-31 MED ORDER — SODIUM CHLORIDE 0.9 % IV SOLN
Freq: Once | INTRAVENOUS | Status: AC
  Filled 2019-12-31: qty 250

## 2019-12-31 NOTE — Patient Instructions (Addendum)

## 2019-12-31 NOTE — Assessment & Plan Note (Addendum)
Patient has had longstanding anemia where the hemoglobin was between 10 to 12 g. Since April 2020 her hemoglobin has gotten worse During the same timeframe her creatinine got from 1.5-2.44   Current treatment:Retacritinjectionsweeklystarted7/31/2020; changed to Aranesp 55m q3 weeks 11/12/2019 Bone marrow biopsy 04/08/2019: Normocellular bone marrow with trilineage hematopoiesis, absent iron stores, flow cytometry no abnormal B or T-cell clonal abnormalities noted.   IV iron therapy:Venofer weekly x4 04/24/19-05/15/19 ______________________________________________________________________________   Right breast cancer: IDC stage IA, ER positive, PR negative, HER-2 negative, s/p Taxotere/Cytoxan x 3 cycles, adjuvant radiation and Exemestane daily since 08/2016.   1. Anemia of chronic kidney disease: Since her iron was low, that will need repletion.  She will receive IV iron and then will resume Aranesp on 01/29/2019.  Due to her symptomatic anemia and upcoming travel she will receive two units of blood today.    2. Breast cancer: She continues on Exemestane with good tolerance.  She will continue this.  Since she is having increased issues at her breast where the skin abnormality is located, we will obtain breast MRI for complete evaluation.    She knows to call for any questions that may arise between now and her next appointment.  We are happy to see her sooner if needed.   She will return for her IV iron weekly and we will see her again on 01/29/2020 when she is due for her next Aranesp.  She knows to call for any questions that may arise between now and her next appointment.  We are happy to see her sooner if needed.

## 2019-12-31 NOTE — Telephone Encounter (Signed)
Called pt to inform her she would need 2 units of PRBC tomorrow at 12pm, to be here at 1130 for check in. Message sent to [facility] in scheduling to schedule this and call pt. Pt verbalized thanks and understanding

## 2019-12-31 NOTE — Progress Notes (Signed)
[facility] Follow up:    Hannah [name], [address] [place] [address]   DIAGNOSIS: Cancer Staging Breast cancer of upper-inner quadrant of right female breast [facility]) Staging form: Breast, AJCC 7th Edition - Clinical stage from 12/21/2015: Stage IA (T1c, N0, M0) - Unsigned Staging comments: Staged at breast conference on 7.12.17 - Pathologic: Stage IA (T1c, N0, cM0) - Unsigned   SUMMARY OF ONCOLOGIC HISTORY: Oncology History  Breast cancer of upper-inner quadrant of right female breast (Hannah [name])  12/14/2015 Initial Diagnosis   Right breast biopsy 12:30 position: 2 masses, 1.6 cm mass: Invasive ductal carcinoma, grade 2, ER 0%, PR 0%, HER-2 negative, Ki-67 70%; satellite mass 8 mm: IDC grade 2 ER 5%, PR 5%, HER-2 negative, Ki-67 50%; T1cN0 stage IA clinical stage   01/18/2016 Surgery   Right lumpectomy: Multifocal IDC grade 3, 1.9 cm  ER 0%, PR 0%, HER-2 negative, Ki-67 70% and 0.8 cm satellite mass ER 5%, PR 2%, HER-2 negative, Ki-67 50%, high-grade DCIS, margins negative, 0/1 lymph nodes negative T1 cN0 stage IA   02/17/2016 - 04/06/2016 Chemotherapy   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    04/08/2016 - 04/23/2016 Hospital Admission   Hosp adm for cellulitis   05/24/2016 - 07/25/2016 Radiation Therapy    Adj XRT   06/29/2016 - 07/04/2016 Hospital Admission   Seizure like activity, MRI brain and EEG unremarkable    08/16/2016 -  Anti-estrogen oral therapy   Letrozole could not tolerate it due to dizziness and lightheadedness, switched to anastrozole 04/04/2017 switched to exemestane 05/22/2017     CURRENT THERAPY: Exemestane, IV iron, Aranesp  INTERVAL HISTORY: Hannah [name] 74 y.o. female returns for evaluation of her persistent anemia, and h/o breast cancer.  She continues on exemestane with good tolerance.  She notes she is very fatigued, and have been receiving IV iron for the past couple of weeks without  improvement.  She has been on Aranesp without improvement in her counts, and iron studies recently revealed a decrease along with the indication of IV iron.  She started to receive IV iron beginning 2 weeks ago, and based on her insurance requirement, she must receive Venofer x 5 instead of feraheme x 2.  She notes that she is going out of town next week and feels she would benefit from some blood.    She also notes that her right breast and inframammary fold is increasingly painful and is starting to radiation to her right chest wall.  She notes that it will go away with tylenol or some stretching, but wanted to let me know about the increase since it has worsened.  The area does have some thickening and redness, related to radiation,however it has persistent and mammography of this area has been negative.  Several biopsies of the area were also completed in 03/2019 and were negative.   Patient Active Problem List   Diagnosis Date Noted  . Pain in left shoulder 03/26/2019  . History of breast cancer in female 03/26/2019  . Hypokalemia 02/20/2019  . Acute exacerbation of CHF (congestive heart failure) ([place]) 02/19/2019  . Pleural effusion on right 09/25/2018  . Thrombocytopenia (Hannah [name]) 09/25/2018  . Cholecystitis with cholelithiasis 07/07/2018  . Prolonged Q-T interval on ECG 05/21/2018  . Atrial fibrillation ([place]) 01/23/2018  . Closed fracture of head of left humerus 09/09/2017  . Syncope 09/06/2017  . Seizure-like activity (Hannah [name]) 09/01/2017  . AKI (acute kidney injury) (  [place]) 09/01/2017  . Adrenal insufficiency ([place]) 07/03/2016  . Seizure (Hannah [name]) 06/29/2016  . Chemotherapy-induced peripheral neuropathy ([place]) 04/27/2016  . 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 Osteomyelitis of toe of left foot (Hannah [name]) 04/06/2016  . Mania (Hannah [name]) 03/05/2016  . GERD (gastroesophageal reflux disease) 02/29/2016  . Chemotherapy induced nausea and vomiting 02/24/2016  . Port catheter in place 02/17/2016   . Sensorineural hearing loss (SNHL) of both ears 01/06/2016  . Breast cancer of upper-inner quadrant of right female breast (Hannah [name]) 12/16/2015  . Osteopenia of multiple sites 10/19/2015  . CKD (chronic kidney disease), stage IV (Hannah [name]) 09/24/2013  . Chronic diastolic CHF (congestive heart failure) (Hannah [name]) 09/24/2013  . Acute on chronic combined systolic and diastolic congestive heart failure (Hannah [name]) 07/17/2013  . Atrial tachycardia (Hannah [name]) 04/03/2013  . CAD (coronary artery disease) 01/24/2011  . Chronic coronary artery disease 01/24/2011  . History of aortic valve replacement 10/27/2010  . Hyperlipidemia   . DOE (dyspnea on exertion)   . Nonischemic cardiomyopathy (Hannah [name])   . Anemia of chronic disease   . Type 2 diabetes mellitus (Hannah [name])   . Iron deficiency anemia   . Allergic rhinitis 10/14/2009  . Essential hypertension 05/12/2009  . H/O atrial tachycardia 05/12/2009  . Asthma 05/12/2009    is allergic to amoxicillin, tape, aldactone [spironolactone], imdur [isosorbide dinitrate], arimidex [anastrozole], latex, and tetracycline.  MEDICAL HISTORY: Past Medical History:  Diagnosis Date  . Abnormally small mouth   . Allergic rhinitis 10/14/2009   Qualifier: Diagnosis of  By: Lamonte Sakai MD, Rose Fillers   Overview:  Overview:  Qualifier: Diagnosis of  By: Lamonte Sakai MD, Rose Fillers  Last Assessment & Plan:  Please continue Xyzal and Nasacort as you have been using them  . Anemia   . Asthma 05/12/2009   10/12/2014 p extensive coaching HFA effectiveness =    75% s spacer    Overview:  Overview:  10/12/2014 p extensive coaching HFA effectiveness =    75% s spacer   Last Assessment & Plan:  Please continue Symbicort 2 puffs twice a day. Remember to rinse and gargle after taking this medication. Take albuterol 2 puffs up to every 4 hours if needed for shortness of breath.  Follow with Dr Lamonte Sakai in 6 month  . Bilateral carotid artery stenosis    a. mild 1-39% by duplex 10/2017, due 2021.  . Breast cancer ([place]) 01/18/2016    right breast  . CAD (coronary artery disease) 01/24/2011   a. s/p CABG 1998 with redo 2009.  Marland Kitchen Chemotherapy induced nausea and vomiting 02/24/2016  . Chemotherapy-induced peripheral neuropathy ([place]) 04/27/2016  . 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 Chronic diastolic CHF (congestive heart failure) ([place]) 09/24/2013  . CKD (chronic kidney disease), stage IV ([place]) 09/24/2013  . Closed fracture of head of left humerus 09/09/2017  . Complication of anesthesia   . Dysrhythmia    a-fib  . Gallstones   . GERD (gastroesophageal reflux disease)   . Glaucoma   . H/O atrial tachycardia 05/12/2009   Qualifier: History of  By: Lamonte Sakai MD, Rose Fillers   Overview:  Overview:  Qualifier: History of  By: Lamonte Sakai MD, Rose Fillers  Last Assessment & Plan:  There was no evidence of this on her cardiac monitor. I would recommend watchful waiting.   Marland Kitchen Heart murmur   . History of kidney stones   . History of non-ST elevation myocardial infarction (NSTEMI)    Sept 2014--  thought to be type II HTN w/ LHC without infarct related artery and  patent grafts  . History of radiation therapy 05/24/16-07/26/16   right breast 50.4 Gy in 28 fractions, right breast boost 10 Gy in 5 fractions  . Hyperlipidemia   . Hypotension   . Iron deficiency anemia   . Mania ([place]) 03/05/2016  . Moderate persistent asthma    pulmologist-  Dr. Malvin Johns  . Osteomyelitis of toe of left foot ([place]) 04/06/2016  . Osteopenia of multiple sites 10/19/2015  . PAF (paroxysmal atrial fibrillation) ([place])   . Personal history of chemotherapy 2017  . Personal history of radiation therapy 2017  . PONV (postoperative nausea and vomiting)   . Port catheter in place 02/17/2016  . Psoriasis    right leg  . Renal calculus, right   . S/P AVR    prosthesis valve placement 2009 at same time re-do CABG  . Seizure-like activity ([place]) 09/01/2017  . Sensorineural hearing loss (SNHL) of both ears 01/06/2016  . Stroke [facility]) 2014   residual rt hearing loss  .  Syncope 09/06/2017  . Type 2 diabetes mellitus ([place])    monitored by dr Dwyane Dee    SURGICAL HISTORY: Past Surgical History:  Procedure Laterality Date  . AORTIC VALVE REPLACEMENT  2009   [facility] same [facility] is [facility]  . BREAST LUMPECTOMY Right 01/18/2016  . BREAST LUMPECTOMY WITH RADIOACTIVE SEED AND SENTINEL LYMPH NODE BIOPSY Right 01/18/2016   Procedure: RIGHT BREAST LUMPECTOMY WITH RADIOACTIVE SEED AND SENTINEL LYMPH NODE BIOPSY;  Surgeon: DAlphonsa Overall MD;  [place]  Service: General;  Laterality: Right;  . CARDIAC CATHETERIZATION  03/23/2008   Pre-redo CABG: L main OK, LAD (T), CFX (T), OM1 99%, RCA (T), LIMA-LAD OK, SVG-OM(?3) OK w/ little florw to OM2, SVG-RCA OK. EF NL  . CARPAL TUNNEL RELEASE    . CHEST TUBE INSERTION Right 06/26/2019   Procedure: INSERTION PLEURAL DRAINAGE CATHETER;  Surgeon: BGaye Pollack MD;  [place]  Service: Thoracic;  Laterality: Right;  . CHOLECYSTECTOMY N/A 07/07/2018   Procedure: LAPAROSCOPIC CHOLECYSTECTOMY WITH INTRAOPERATIVE CHOLANGIOGRAM ERAS PATHWAY;  Surgeon: NAlphonsa Overall MD;  [facility];  Service: General;  Laterality: N/A;  . COLONOSCOPY     around 2015. Possibly with [facility]   . CORONARY ARTERY BYPASS GRAFT  1998 &  re-do 2009   Had LIMA to DX/LAD, SVG to 2 marginal branches and [facility]; SVG to 3rd OM and PD at time of redo  . CYSTOSCOPY W/ URETERAL STENT PLACEMENT Right 12/20/2014   Procedure: CYSTOSCOPY WITH RETROGRADE PYELOGRAM/URETERAL STENT PLACEMENT;  Surgeon: PCleon Gustin MD;  [facility]  Service: Urology;  Laterality: Right;  . CYSTOSCOPY WITH RETROGRADE PYELOGRAM, URETEROSCOPY AND STENT PLACEMENT Left 08/21/2019   Procedure: CYSTOSCOPY WITH RETROGRADE PYELOGRAM, URETEROSCOPY AND STENT PLACEMENT;  Surgeon: MCleon Gustin MD;  [facility];  Service: Urology;  Laterality: Left;  1 HR  . ESOPHAGOGASTRODUODENOSCOPY     many years  ago per patient   . ESOPHAGOGASTRODUODENOSCOPY ENDOSCOPY  06/17/2018  . EYE SURGERY Bilateral    cataracts  . HOLMIUM LASER APPLICATION Right [id number]/4235  Procedure:  HOLMIUM LASER LITHOTRIPSY;  Surgeon: PCleon Gustin MD;  [facility]  Service: Urology;  Laterality: Right;  . HOLMIUM LASER APPLICATION Left 33/61/4431  Procedure: HOLMIUM LASER APPLICATION;  Surgeon: MCleon Gustin MD;  [facility];  Service: Urology;  Laterality: Left;  . IR THORACENTESIS ASP PLEURAL SPACE W/IMG GUIDE  10/03/2018  .  IR THORACENTESIS ASP PLEURAL SPACE W/IMG GUIDE  06/08/2019  . LEFT HEART CATHETERIZATION WITH CORONARY/GRAFT ANGIOGRAM N/A 02/23/2013   Procedure: LEFT HEART CATHETERIZATION WITH Beatrix Fetters;  Surgeon: Blane Ohara, MD;  [facility];  Service: Cardiovascular;  Laterality: N/A;  . LOOP RECORDER INSERTION N/A 08/30/2017   Procedure: LOOP RECORDER INSERTION;  Surgeon: Evans Lance, MD;  Location: [place] CV LAB;  Service: Cardiovascular;  Laterality: N/A;  . PORTACATH PLACEMENT Left 01/18/2016   Procedure: INSERTION PORT-A-CATH;  Surgeon: Alphonsa Overall, MD;  Location: [place];  Service: General;  Laterality: Left;  . portacath removal    . REMOVAL OF PLEURAL DRAINAGE CATHETER Right 07/14/2019   Procedure: REMOVAL OF PLEURAL DRAINAGE CATHETER;  Surgeon: Gaye Pollack, MD;  Location: [place];  Service: Thoracic;  Laterality: Right;  . TALC PLEURODESIS N/A 06/26/2019   Procedure: Pietro Cassis;  Surgeon: Gaye Pollack, MD;  Location[facility];  Service: Thoracic;  Laterality: N/A;  . TONSILLECTOMY    . TRANSTHORACIC ECHOCARDIOGRAM  02-24-2013      mild LVH,  ef 50-55%/  AV bioprosthesis was present with very mild stenosis and no regurg., mean grandient 102mHg, peak grandient 273mg /  mild MR/  mild LAE and RAE/  moderate TR  . TUBAL LIGATION      SOCIAL HISTORY: Social History   Socioeconomic History  . Marital status: Married     Spouse name: Not on file  . Number of children: 2  . Years of education: Not on file  . Highest education level: Not on file  Occupational History  . Occupation: retired  Tobacco Use  . Smoking status: Never Smoker  . Smokeless tobacco: Never Used  Vaping Use  . Vaping Use: Never used  Substance and Sexual Activity  . Alcohol use: No  . Drug use: No  . Sexual activity: Yes    Birth control/protection: None  Other Topics Concern  . Not on file  Social History Narrative  . Not on file   Social Determinants of Health   Financial Resource Strain:   . Difficulty of Paying Living Expenses:   Food Insecurity:   . Worried About RuCharity fundraisern the Last Year:   . RaArboriculturistn the Last Year:   Transportation Needs:   . LaFilm/video editorMedical):   . Marland Kitchenack of Transportation (Non-Medical):   Physical Activity:   . Days of Exercise per Week:   . Minutes of Exercise per Session:   Stress:   . Feeling of Stress :   Social Connections:   . Frequency of Communication with Friends and Family:   . Frequency of Social Gatherings with Friends and Family:   . Attends Religious Services:   . Active Member of Clubs or Organizations:   . Attends ClArchivisteetings:   . Marland Kitchenarital Status:   Intimate Partner Violence:   . Fear of Current or Ex-Partner:   . Emotionally Abused:   . Marland Kitchenhysically Abused:   . Sexually Abused:     FAMILY HISTORY: Family History  Problem Relation Age of Onset  . Heart disease Father   . Heart failure Father   . Diabetes Maternal Grandmother   . Heart disease Maternal Grandmother   . Diabetes Son   . Healthy Brother        #1  . Heart attack Brother        #2  . Heart disease Brother        #  2  . Colon cancer Neg Hx   . Esophageal cancer Neg Hx     Review of Systems  Constitutional: Positive for fatigue. Negative for appetite change, chills, fever and unexpected weight change.  HENT:   Negative for hearing loss and  lump/mass.   Eyes: Negative for eye problems and icterus.  Respiratory: Negative for chest tightness, cough and shortness of breath.   Cardiovascular: Negative for chest pain, leg swelling and palpitations.  Gastrointestinal: Negative for abdominal distention, abdominal pain, constipation, diarrhea, nausea and vomiting.  Endocrine: Negative for hot flashes.  Genitourinary: Negative for difficulty urinating.   Musculoskeletal: Positive for flank pain. Negative for arthralgias.  Skin: Negative for itching and rash.  Neurological: Negative for dizziness, extremity weakness, headaches and numbness.  Hematological: Negative for adenopathy. Does not bruise/bleed easily.  Psychiatric/Behavioral: Negative for depression. The patient is not nervous/anxious.       PHYSICAL EXAMINATION  ECOG PERFORMANCE STATUS: 2 - Symptomatic, <50% confined to bed  Vitals:   12/31/19 1343  BP: (!) 124/45  Pulse: 64  Resp: 17  Temp: 98.9 F (37.2 C)  SpO2: 100%    Physical Exam Constitutional:      General: She is not in acute distress.    Appearance: Normal appearance. She is not toxic-appearing.  HENT:     Head: Normocephalic and atraumatic.  Eyes:     General: No scleral icterus. Cardiovascular:     Rate and Rhythm: Normal rate and regular rhythm.     Pulses: Normal pulses.     Heart sounds: Normal heart sounds.  Pulmonary:     Effort: Pulmonary effort is normal.     Breath sounds: Normal breath sounds.     Comments: Right breast with some abnormal thickened redness in the inframammary fold and axilla Abdominal:     General: Abdomen is flat. Bowel sounds are normal.     Palpations: Abdomen is soft.  Skin:    Capillary Refill: Capillary refill takes less than 2 seconds.  Neurological:     General: No focal deficit present.     Mental Status: She is alert.  Psychiatric:        Mood and Affect: Mood normal.        Behavior: Behavior normal.     LABORATORY DATA:  CBC    Component  Value Date/Time   WBC 5.5 12/31/2019 1315   WBC 6.7 10/30/2019 2059   RBC 2.76 (L) 12/31/2019 1316   RBC 2.74 (L) 12/31/2019 1315   HGB 7.8 (L) 12/31/2019 1315   HGB 11.4 06/02/2019 1436   HGB 8.5 (L) 04/27/2016 1352   HCT 26.3 (L) 12/31/2019 1315   HCT 36.2 06/02/2019 1436   HCT 27.3 (L) 04/27/2016 1352   PLT 118 (L) 12/31/2019 1315   PLT 154 06/02/2019 1436   MCV 96.0 12/31/2019 1315   MCV 93 06/02/2019 1436   MCV 97.5 04/27/2016 1352   MCH 28.5 12/31/2019 1315   MCHC 29.7 (L) 12/31/2019 1315   RDW 17.5 (H) 12/31/2019 1315   RDW 16.7 (H) 06/02/2019 1436   RDW 17.4 (H) 04/27/2016 1352   LYMPHSABS 0.5 (L) 12/31/2019 1315   LYMPHSABS 0.5 (L) 02/23/2019 1221   LYMPHSABS 1.0 04/27/2016 1352   MONOABS 0.5 12/31/2019 1315   MONOABS 0.9 04/27/2016 1352   EOSABS 0.3 12/31/2019 1315   EOSABS 0.2 02/23/2019 1221   BASOSABS 0.1 12/31/2019 1315   BASOSABS 0.0 02/23/2019 1221   BASOSABS 0.1 04/27/2016 1352    CMP  Component Value Date/Time   NA 138 12/31/2019 1315   NA 142 09/22/2019 1152   NA 143 04/27/2016 1352   K 4.9 12/31/2019 1315   K 4.1 04/27/2016 1352   CL 101 12/31/2019 1315   CO2 28 12/31/2019 1315   CO2 29 04/27/2016 1352   GLUCOSE 289 (H) 12/31/2019 1315   GLUCOSE 109 04/27/2016 1352   BUN 46 (H) 12/31/2019 1315   BUN 36 (H) 09/22/2019 1152   BUN 29.5 (H) 04/27/2016 1352   CREATININE 2.50 (H) 12/31/2019 1315   CREATININE 1.66 (H) 05/23/2016 1143   CREATININE 1.3 (H) 04/27/2016 1352   CALCIUM 8.4 (L) 12/31/2019 1315   CALCIUM 8.9 04/27/2016 1352   PROT 6.0 (L) 12/31/2019 1315   PROT 5.9 (L) 08/03/2019 1624   PROT 6.0 (L) 04/27/2016 1352   ALBUMIN 2.8 (L) 12/31/2019 1315   ALBUMIN 3.2 (L) 08/03/2019 1624   ALBUMIN 2.7 (L) 04/27/2016 1352   AST 55 (H) 12/31/2019 1315   AST 28 04/27/2016 1352   ALT 32 12/31/2019 1315   ALT 24 04/27/2016 1352   ALKPHOS 58 12/31/2019 1315   ALKPHOS 62 04/27/2016 1352   BILITOT 0.6 12/31/2019 1315   BILITOT 0.81  04/27/2016 1352   GFRNONAA 18 (L) 12/31/2019 1315   GFRAA 21 (L) 12/31/2019 1315          ASSESSMENT and THERAPY PLAN:   Breast cancer of upper-inner quadrant of right female breast ([place])  Patient has had longstanding anemia where the hemoglobin was between 10 to 12 g. Since April 2020 her hemoglobin has gotten worse During the same timeframe her creatinine got from 1.5-2.44   Current treatment:Retacritinjectionsweeklystarted7/31/2020; changed to Aranesp 526m q3 weeks 11/12/2019 Bone marrow biopsy 04/08/2019: Normocellular bone marrow with trilineage hematopoiesis, absent iron stores, flow cytometry no abnormal B or T-cell clonal abnormalities noted.   IV iron therapy:Venofer weekly x4 04/24/19-05/15/19 ______________________________________________________________________________   Right breast cancer: IDC stage IA, ER positive, PR negative, HER-2 negative, s/p Taxotere/Cytoxan x 3 cycles, adjuvant radiation and Exemestane daily since 08/2016.   1. Anemia of chronic kidney disease: Since her iron was low, that will need repletion.  She will receive IV iron and then will resume Aranesp on 01/29/2019.  Due to her symptomatic anemia and upcoming travel she will receive two units of blood today.    2. Breast cancer: She continues on Exemestane with good tolerance.  She will continue this.  Since she is having increased issues at her breast where the skin abnormality is located, we will obtain breast MRI for complete evaluation.    She knows to call for any questions that may arise between now and her next appointment.  We are happy to see her sooner if needed.   She will return for her IV iron weekly and we will see her again on 01/29/2020 when she is due for her next Aranesp.  She knows to call for any questions that may arise between now and her next appointment.  We are happy to see her sooner if needed.    Orders Placed This Encounter  Procedures  . MR Breast Bilateral  Wo Contrast    U[employer]/MCR Epic order       Standing Status:   Future    Standing Expiration Date:   12/30/2020    Order Specific Question:   What is the patient's sedation requirement?    Answer:   No Sedation    Order Specific Question:   Does the patient have a  pacemaker or implanted devices?    Answer:   No    Order Specific Question:   Preferred imaging location?    Answer:   [facility] (table limit-550lbs)    Order Specific Question:   Radiology Contrast Protocol - do NOT remove file path    Answer:   [email or web address]  . Care order/instruction    Transfuse Parameters    Standing Status:   Future    Standing Expiration Date:   12/30/2020  . Informed Consent Details: Physician/Practitioner Attestation; Transcribe to consent form and obtain patient signature    Standing Status:   Future    Standing Expiration Date:   12/30/2020    Order Specific Question:   Physician/Practitioner attestation of informed consent for blood and or blood product transfusion    Answer:   I, the physician/practitioner, attest that I have discussed with the patient the benefits, risks, side effects, alternatives, likelihood of achieving goals and potential problems during recovery for the procedure that I have provided informed consent.    Order Specific Question:   Product(s)    Answer:   All Product(s)  . Care order/instruction    Transfuse Parameters    Standing Status:   Future    Standing Expiration Date:   12/30/2020  . Type and screen         Standing Status:   Future    Number of Occurrences:   1    Standing Expiration Date:   12/30/2020    Total encounter time: 45 minutes*  Wilber Bihari, NP 01/02/20 12:37 PM Medical Oncology and Hematology [facility] [place], [place] [address] Tel. [phone]    Fax. [phone]  *Total Encounter Time as defined by the Centers for Medicare and Medicaid Services includes, in addition to  the face-to-face time of a patient visit (documented in the note above) non-face-to-face time: obtaining and reviewing outside history, ordering and reviewing medications, tests or procedures, care coordination (communications with other health care professionals or caregivers) and documentation in the medical record.

## 2019-12-31 NOTE — Progress Notes (Signed)
Per MD Gaye Pollack to to get Iv iron only today, no aranesp.  Pt will receive 2 units blood tomorrow (7/23), pt aware to keep blood bracelet on for appt. Pt stayed for 30 min post infusion observation period w/out any issues, VSS.

## 2020-01-01 ENCOUNTER — Ambulatory Visit (HOSPITAL_COMMUNITY): Admission: RE | Admit: 2020-01-01 | Payer: Medicare Other | Source: Ambulatory Visit

## 2020-01-01 ENCOUNTER — Telehealth: Payer: Self-pay | Admitting: Hematology and Oncology

## 2020-01-01 ENCOUNTER — Inpatient Hospital Stay: Payer: Medicare Other

## 2020-01-01 ENCOUNTER — Other Ambulatory Visit: Payer: Self-pay

## 2020-01-01 DIAGNOSIS — C50211 Malignant neoplasm of upper-inner quadrant of right female breast: Secondary | ICD-10-CM

## 2020-01-01 DIAGNOSIS — Z170 Estrogen receptor positive status [ER+]: Secondary | ICD-10-CM

## 2020-01-01 DIAGNOSIS — D649 Anemia, unspecified: Secondary | ICD-10-CM

## 2020-01-01 DIAGNOSIS — I129 Hypertensive chronic kidney disease with stage 1 through stage 4 chronic kidney disease, or unspecified chronic kidney disease: Secondary | ICD-10-CM | POA: Diagnosis not present

## 2020-01-01 LAB — IRON AND TIBC
Iron: 77 ug/dL (ref 41–142)
Saturation Ratios: 29 % (ref 21–57)
TIBC: 270 ug/dL (ref 236–444)
UIBC: 193 ug/dL (ref 120–384)

## 2020-01-01 LAB — PREPARE RBC (CROSSMATCH)

## 2020-01-01 LAB — FERRITIN: Ferritin: 205 ng/mL (ref 11–307)

## 2020-01-01 MED ORDER — ACETAMINOPHEN 325 MG PO TABS
ORAL_TABLET | ORAL | Status: AC
  Filled 2020-01-01: qty 2

## 2020-01-01 MED ORDER — ACETAMINOPHEN 325 MG PO TABS
650.0000 mg | ORAL_TABLET | Freq: Once | ORAL | Status: AC
  Administered 2020-01-01: 650 mg via ORAL

## 2020-01-01 MED ORDER — DIPHENHYDRAMINE HCL 25 MG PO CAPS
25.0000 mg | ORAL_CAPSULE | Freq: Once | ORAL | Status: AC
  Administered 2020-01-01: 25 mg via ORAL

## 2020-01-01 MED ORDER — DIPHENHYDRAMINE HCL 25 MG PO CAPS
ORAL_CAPSULE | ORAL | Status: AC
  Filled 2020-01-01: qty 1

## 2020-01-01 MED ORDER — SODIUM CHLORIDE 0.9% IV SOLUTION
250.0000 mL | Freq: Once | INTRAVENOUS | Status: AC
  Administered 2020-01-01: 250 mL via INTRAVENOUS
  Filled 2020-01-01: qty 250

## 2020-01-01 NOTE — Telephone Encounter (Signed)
No 7/22 los. No changes made to pt's schedule.

## 2020-01-01 NOTE — Patient Instructions (Signed)

## 2020-01-02 ENCOUNTER — Encounter: Payer: Self-pay | Admitting: Adult Health

## 2020-01-03 LAB — TYPE AND SCREEN
ABO/RH(D): O POS
Antibody Screen: NEGATIVE
Unit division: 0
Unit division: 0

## 2020-01-03 LAB — BPAM RBC
Blood Product Expiration Date: 202108202359
Blood Product Expiration Date: 202108202359
ISSUE DATE / TIME: 202107231309
ISSUE DATE / TIME: 202107231309
Unit Type and Rh: 5100
Unit Type and Rh: 5100

## 2020-01-04 ENCOUNTER — Ambulatory Visit (INDEPENDENT_AMBULATORY_CARE_PROVIDER_SITE_OTHER): Payer: Medicare Other | Admitting: *Deleted

## 2020-01-04 DIAGNOSIS — R55 Syncope and collapse: Secondary | ICD-10-CM

## 2020-01-05 LAB — CUP PACEART REMOTE DEVICE CHECK
Date Time Interrogation Session: 20210725233239
Implantable Pulse Generator Implant Date: 20190322

## 2020-01-05 NOTE — Progress Notes (Signed)
CARDIOLOGY OFFICE NOTE  Date:  01/19/2020    Hannah [name] Date of Birth: 1946/03/08 Medical Record #[id number]  PCP:  [name], DO  Cardiologist:  [name] Median   Chief Complaint  Patient presents with  . Follow-up    History of Present Illness: Hannah [name] is a 74 y.o. female who presents today for a follow up visit. Seen for Dr. Lovena Le.Primarily follows with me.  She has a very complex/extensivehistory - this includes ahistory ofCAD s/p CABG (1998 with redo 2009- Dr. Cyndia Bent), bioprosthetic AVR in 4401, chronic diastolic CHF, asthma, carotid artery disease (1-39% by duplex 10/2017, due 2021), CKD stage IV(Dr. Posey Pronto), PAF- has been on Multaq in the past and now on amiodarone, atrial tachycardia, breast CA, orthostasis,chronicanemia, thrombocytopenia, mild dilation of ascending aorta by echo 10/2018, recurrent syncope/orthostasis,prior recurrentrightpleural effusion requiring thoracentesis4/2020and 05/2019 with subsequent Pleurex/talc by Dr. Cyndia Bent, HTN, GERD,&HLD.   I have followed her for many years - she has had periodic issues with volume overload, orthostasis and tachy palpitations.Last cath in 2014 - grafts patent. Myoview in 2015 without ischemia.  She has had worsening anemia. She has ended up having multiple repeat breast biopsies that were negative following abnormal mammogram (after her breast cancer). She has had worsening orthostasis - to the point of injury - fell in our parking lot in 02/2019 with significant laceration to the head - she is back on Midodrine. Was not able to have colonoscopy due to those events. EF remains normal. She has had recurrent kidney stones that has resulted in worsening CKD. She requires periodic transfusions for her anemia.   Last seen towards the end of June - trying to do more in the way of activity. Had been switched from Retacrit to Aranesp by hematology. Feeling better with higher dose of Imdur from  earlier this summer. She has not been vaccinated - half of her family has not - we had a long discussion about this.   Comes in today. Here with [name] - I am seeing him later today as well. She has been requiring more transfusions. Was transfused 2 weeks ago and then again last Saturday. Does not see any blood in her stool. Has noted less swelling - using less Lasix. Unclear why.  Fleeting lightheadedness. BP low. Chest pain seems controlled with the Imdur. Weight is down further. She goes back to the [place] later this week. Has not been vaccinated. Back staying home more. Does not like that she feels so bad - wants more energy. Breast MRI next month. Carotids later this week are planned.   Past Medical History:  Diagnosis Date  . Abnormally small mouth   . Allergic rhinitis 10/14/2009   Qualifier: Diagnosis of  By: Lamonte Sakai MD, Rose Fillers   Overview:  Overview:  Qualifier: Diagnosis of  By: Lamonte Sakai MD, Rose Fillers  Last Assessment & Plan:  Please continue Xyzal and Nasacort as you have been using them  . Anemia   . Asthma 05/12/2009   10/12/2014 p extensive coaching HFA effectiveness =    75% s spacer    Overview:  Overview:  10/12/2014 p extensive coaching HFA effectiveness =    75% s spacer   Last Assessment & Plan:  Please continue Symbicort 2 puffs twice a day. Remember to rinse and gargle after taking this medication. Take albuterol 2 puffs up to every 4 hours if needed for shortness of breath.  Follow with Dr Lamonte Sakai in 6 month  . Bilateral  carotid artery stenosis    a. mild 1-39% by duplex 10/2017, due 2021.  . Breast cancer ([place]) 01/18/2016   right breast  . CAD (coronary artery disease) 01/24/2011   a. s/p CABG 1998 with redo 2009.  Marland Kitchen Chemotherapy induced nausea and vomiting 02/24/2016  . Chemotherapy-induced peripheral neuropathy ([place]) 04/27/2016  . 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 Chronic diastolic CHF (congestive heart failure) ([place]) 09/24/2013  . CKD (chronic kidney disease),  stage IV ([place]) 09/24/2013  . Closed fracture of head of left humerus 09/09/2017  . Complication of anesthesia   . Dysrhythmia    a-fib  . Gallstones   . GERD (gastroesophageal reflux disease)   . Glaucoma   . H/O atrial tachycardia 05/12/2009   Qualifier: History of  By: Lamonte Sakai MD, Rose Fillers   Overview:  Overview:  Qualifier: History of  By: Lamonte Sakai MD, Rose Fillers  Last Assessment & Plan:  There was no evidence of this on her cardiac monitor. I would recommend watchful waiting.   Marland Kitchen Heart murmur   . History of kidney stones   . History of non-ST elevation myocardial infarction (NSTEMI)    Sept 2014--  thought to be type II HTN w/ LHC without infarct related artery and patent grafts  . History of radiation therapy 05/24/16-07/26/16   right breast 50.4 Gy in 28 fractions, right breast boost 10 Gy in 5 fractions  . Hyperlipidemia   . Hypotension   . Iron deficiency anemia   . Mania ([place]) 03/05/2016  . Moderate persistent asthma    pulmologist-  Dr. Malvin Johns  . Osteomyelitis of toe of left foot ([place]) 04/06/2016  . Osteopenia of multiple sites 10/19/2015  . PAF (paroxysmal atrial fibrillation) ([place])   . Personal history of chemotherapy 2017  . Personal history of radiation therapy 2017  . PONV (postoperative nausea and vomiting)   . Port catheter in place 02/17/2016  . Psoriasis    right leg  . Renal calculus, right   . S/P AVR    prosthesis valve placement 2009 at same time re-do CABG  . Seizure-like activity ([place]) 09/01/2017  . Sensorineural hearing loss (SNHL) of both ears 01/06/2016  . Stroke [facility]) 2014   residual rt hearing loss  . Syncope 09/06/2017  . Type 2 diabetes mellitus ([place])    monitored by dr Dwyane Dee    Past Surgical History:  Procedure Laterality Date  . AORTIC VALVE REPLACEMENT  2009   [facility]done same time is CABG)  . BREAST LUMPECTOMY Right 01/18/2016  . BREAST LUMPECTOMY WITH RADIOACTIVE SEED AND SENTINEL LYMPH NODE BIOPSY Right 01/18/2016    Procedure: RIGHT BREAST LUMPECTOMY WITH RADIOACTIVE SEED AND SENTINEL LYMPH NODE BIOPSY;  Surgeon: DAlphonsa Overall MD;  [place]  Service: General;  Laterality: Right;  . CARDIAC CATHETERIZATION  03/23/2008   Pre-redo CABG: L main OK, LAD (T), CFX (T), OM1 99%, RCA (T), LIMA-LAD OK, SVG-OM(?3) OK w/ little florw to OM2, SVG-RCA OK. EF NL  . CARPAL TUNNEL RELEASE    . CHEST TUBE INSERTION Right 06/26/2019   Procedure: INSERTION PLEURAL DRAINAGE CATHETER;  Surgeon: BGaye Pollack MD;  [place]  Service: Thoracic;  Laterality: Right;  . CHOLECYSTECTOMY N/A 07/07/2018   Procedure: LAPAROSCOPIC CHOLECYSTECTOMY WITH INTRAOPERATIVE CHOLANGIOGRAM ERAS PATHWAY;  Surgeon: NAlphonsa Overall MD;  Location: [facility];  Service: General;  Laterality: N/A;  . COLONOSCOPY     around 2015. Possibly with [facility]   . CORONARY ARTERY BYPASS GRAFT  1998 &  re-do 2009   Had LIMA to DX/LAD, SVG to 2 marginal branches and SVG to [facility] originally; SVG to 3rd OM and PD at time of redo  . CYSTOSCOPY W/ URETERAL STENT PLACEMENT Right 12/20/2014   Procedure: CYSTOSCOPY WITH RETROGRADE PYELOGRAM/URETERAL STENT PLACEMENT;  Surgeon: Cleon Gustin, MD;  [facility];  Service: Urology;  Laterality: Right;  . CYSTOSCOPY WITH RETROGRADE PYELOGRAM, URETEROSCOPY AND STENT PLACEMENT Left 08/21/2019   Procedure: CYSTOSCOPY WITH RETROGRADE PYELOGRAM, URETEROSCOPY AND STENT PLACEMENT;  Surgeon: Cleon Gustin, MD;  Location: [facility];  Service: Urology;  Laterality: Left;  1 HR  . ESOPHAGOGASTRODUODENOSCOPY     many years ago per patient   . ESOPHAGOGASTRODUODENOSCOPY ENDOSCOPY  06/17/2018  . EYE SURGERY Bilateral    cataracts  . HOLMIUM LASER APPLICATION Right 3/90/3009   Procedure:  HOLMIUM LASER LITHOTRIPSY;  Surgeon: Cleon Gustin, MD;  Location: [facility];  Service: Urology;  Laterality: Right;  . HOLMIUM LASER APPLICATION Left 2/33/0076   Procedure: HOLMIUM LASER  APPLICATION;  Surgeon: Cleon Gustin, MD;  [facility];  Service: Urology;  Laterality: Left;  . IR THORACENTESIS ASP PLEURAL SPACE W/IMG GUIDE  10/03/2018  . IR THORACENTESIS ASP PLEURAL SPACE W/IMG GUIDE  06/08/2019  . LEFT HEART CATHETERIZATION WITH CORONARY/GRAFT ANGIOGRAM N/A 02/23/2013   Procedure: LEFT HEART CATHETERIZATION WITH Beatrix Fetters;  Surgeon: Blane Ohara, MD;  Location: [facility] CATH LAB;  Service: Cardiovascular;  Laterality: N/A;  . LOOP RECORDER INSERTION N/A 08/30/2017   Procedure: LOOP RECORDER INSERTION;  Surgeon: Evans Lance, MD;  Location: [place] CV LAB;  Service: Cardiovascular;  Laterality: N/A;  . PORTACATH PLACEMENT Left 01/18/2016   Procedure: INSERTION PORT-A-CATH;  Surgeon: Alphonsa Overall, MD;  Location: [place];  Service: General;  Laterality: Left;  . portacath removal    . REMOVAL OF PLEURAL DRAINAGE CATHETER Right 07/14/2019   Procedure: REMOVAL OF PLEURAL DRAINAGE CATHETER;  Surgeon: Gaye Pollack, MD;  Location: [place];  Service: Thoracic;  Laterality: Right;  . TALC PLEURODESIS N/A 06/26/2019   Procedure: Pietro Cassis;  Surgeon: Gaye Pollack, MD;  Location[facility];  Service: Thoracic;  Laterality: N/A;  . TONSILLECTOMY    . TRANSTHORACIC ECHOCARDIOGRAM  02-24-2013      mild LVH,  ef 50-55%/  AV bioprosthesis was present with very mild stenosis and no regurg., mean grandient 45mHg, peak grandient 245mg /  mild MR/  mild LAE and RAE/  moderate TR  . TUBAL LIGATION       Medications: Current Meds  Medication Sig  . ACCU-CHEK GUIDE test strip Use to test blood sugar 3 times daily  . acetaminophen (TYLENOL) 325 MG tablet Take 2 tablets (650 mg total) by mouth every 6 (six) hours as needed for mild pain (or Fever >/= 101).  . Marland Kitchenlbuterol (VENTOLIN HFA) 108 (90 Base) MCG/ACT inhaler Inhale 2 puffs into the lungs every 6 (six) hours as needed for wheezing or shortness of breath.  . Marland Kitchenmiodarone (PACERONE) 200 MG tablet Take 1 tablet  (200 mg total) by mouth daily.  . Blood Glucose Monitoring Suppl (ACCU-CHEK GUIDE ME) w/Device KIT Use accu chek meter to check blood sugar three times daily.  . budesonide-formoterol (SYMBICORT) 160-4.5 MCG/ACT inhaler Inhale 2 puffs into the lungs 2 (two) times daily.  . Dulaglutide (TRULICITY) 1.5 MGAU/6.3FHOPN Inject 1.5 mg into the skin once a week.  . Marland KitchenLIQUIS 5 MG TABS tablet Take 1 tablet (5  mg total) by mouth 2 (two) times daily.  Marland Kitchen exemestane (AROMASIN) 25 MG tablet TAKE ONE TABLET BY MOUTH DAILY AFTER BREAKFAST  . ezetimibe (ZETIA) 10 MG tablet TAKE ONE TABLET BY MOUTH EVERY DAY  . famotidine (PEPCID) 20 MG tablet Take 20 mg by mouth 2 (two) times daily.  . furosemide (LASIX) 20 MG tablet Take 20 mg by mouth daily as needed.  . insulin glargine (LANTUS) 100 UNIT/ML injection Inject 18 Units into the skin daily.  . Insulin Lispro (HUMALOG KWIKPEN [place]) Inject 6-8 Units into the skin 3 (three) times daily.   . Insulin Pen Needle (BD PEN NEEDLE NANO U/F) 32G X 4 MM MISC USE AS INSTRUCTED TO INJECT INSULIN 4 TIMES DAILY.  . isosorbide mononitrate (IMDUR) 30 MG 24 hr tablet Take 1 tablet (30 mg total) by mouth daily.  Marland Kitchen loratadine (CLARITIN) 10 MG tablet Take 10 mg by mouth daily.   . metoprolol tartrate (LOPRESSOR) 25 MG tablet Take 1 tablet (25 mg total) by mouth 2 (two) times daily. Extra dose if needed for palpitations.  . midodrine (PROAMATINE) 2.5 MG tablet Take 2.5 mg by mouth 3 (three) times daily with meals.  . nitroGLYCERIN (NITROSTAT) 0.4 MG SL tablet Place 1 tablet (0.4 mg total) under the tongue every 5 (five) minutes as needed for chest pain.  . rosuvastatin (CRESTOR) 40 MG tablet Take 1 tablet (40 mg total) by mouth at bedtime.  Marland Kitchen zolpidem (AMBIEN) 5 MG tablet Take 5 mg by mouth at bedtime.      Allergies: Allergies  Allergen Reactions  . Amoxicillin Rash and Other (See Comments)    Tolerates Cephalosporins Has patient had a PCN reaction causing immediate rash,  facial/tongue/throat swelling, SOB or lightheadedness with hypotension: Yes Has patient had a PCN reaction causing severe rash involving mucus membranes or skin necrosis: Yes Has patient had a PCN reaction that required hospitalization No Has patient had a PCN reaction occurring within the last 10 years: No If all of the above answers are "NO", then may proceed with Cephalosporin use.   . Tape Other (See Comments)    Must use paper tape  . Aldactone [Spironolactone] Other (See Comments)    CKD/hypokalemia  . Imdur [Isosorbide Dinitrate] Other (See Comments)    Headache/severe hypotension/Syncope  . Arimidex [Anastrozole] Nausea Only  . Latex Itching and Other (See Comments)    (Dentist office)  . Tetracycline Rash    Social History: The patient  reports that she has never smoked. She has never used smokeless tobacco. She reports that she does not drink alcohol and does not use drugs.   Family History: The patient's family history includes Diabetes in her maternal grandmother and son; Healthy in her brother; Heart attack in her brother; Heart disease in her brother, father, and maternal grandmother; Heart failure in her father.   Review of Systems: Please see the history of present illness.   All other systems are reviewed and negative.   Physical Exam: VS:  BP (!) 90/42   Pulse 70   Ht 5' 4"  (1.626 m)   Wt 137 lb (62.1 kg)   LMP  (LMP Unknown)   SpO2 95%   BMI 23.52 kg/m  .  BMI Body mass index is 23.52 kg/m.  Wt Readings from Last 3 Encounters:  01/19/20 137 lb (62.1 kg)  01/01/20 142 lb 11.2 oz (64.7 kg)  12/31/19 143 lb 4.8 oz (65 kg)    General: Pleasant. She looks chronically ill - little frail -  color is sallow. She is in no acute distress. Weight is down 13 pounds since our last visit.  Cardiac: Regular rate and rhythm. Outflow murmur noted. No edema today.  Respiratory:  Lungs are clear to auscultation bilaterally with normal work of breathing.  GI: Soft and  nontender.  MS: No deformity or atrophy. Gait and ROM intact. She holds on to him to walk.  Skin: Warm and dry. Color is normal.  Neuro:  Strength and sensation are intact and no gross focal deficits noted.  Psych: Alert, appropriate and with normal affect.   LABORATORY DATA:  EKG:  EKG is not ordered today.    Lab Results  Component Value Date   WBC 5.8 01/15/2020   HGB 7.5 (L) 01/15/2020   HCT 24.3 (L) 01/15/2020   PLT 131 (L) 01/15/2020   GLUCOSE 289 (H) 12/31/2019   CHOL 106 12/02/2018   TRIG 97 12/02/2018   HDL 34 (L) 12/02/2018   LDLDIRECT 53.0 10/08/2016   LDLCALC 53 12/02/2018   ALT 32 12/31/2019   AST 55 (H) 12/31/2019   NA 138 12/31/2019   K 4.9 12/31/2019   CL 101 12/31/2019   CREATININE 2.50 (H) 12/31/2019   BUN 46 (H) 12/31/2019   CO2 28 12/31/2019   TSH 3.090 08/03/2019   INR 1.4 (H) 06/24/2019   HGBA1C 6.1 (H) 08/13/2019   MICROALBUR 14.2 (H) 07/02/2019     BNP (last 3 results)  Recent Labs    03/04/19 1429  BNP 273.4*    ProBNP (last 3 results) Recent Labs    02/19/19 1126 02/23/19 1221  PROBNP 2,996* 2,825*     Other Studies Reviewed Today:  ECHO IMPRESSIONS 05/2019  1. Left ventricular ejection fraction, by visual estimation, is 60 to 65%. The left ventricle has normal function. Left ventricular septal wall thickness was normal. Normal left ventricular posterior wall thickness. There is no left ventricular  hypertrophy.  2. Elevated left ventricular end-diastolic pressure.  3. Left ventricular diastolic parameters are consistent with Grade II diastolic dysfunction (pseudonormalization).  4. Global right ventricle has normal systolic function.The right ventricular size is normal. No increase in right ventricular wall thickness.  5. Left atrial size was severely dilated.  6. Right atrial size was normal.  7. Mild mitral annular calcification.  8. Mild thickening of the anterior and posterior mitral valve leaflet(s).  9. The mitral  valve is normal in structure. Mild mitral valve regurgitation. No evidence of mitral stenosis.  10. The tricuspid valve is normal in structure.  11. Aortic valve regurgitation is not visualized. No evidence of aortic valve sclerosis or stenosis.  12. The pulmonic valve was normal in structure. Pulmonic valve regurgitation is trivial.  13. Normal pulmonary artery systolic pressure.  14. The inferior vena cava is normal in size with greater than 50% respiratory variability, suggesting right atrial pressure of 3 mmHg.   CT CHEST IMPRESSION 04/2019:  Moderate right pleural effusion, increased from prior CT. Associated  right lower lobe atelectasis.  No evidence of pneumonia.  1.2 x 2.4 cm lesion along the inferior aspect of the right  breast/chest wall. While additional findings noted above correlate  with areas of fat necrosis, this lesion was not clearly identified  when correlating with prior breast tomography report. Breast imaging  consultation is suggested for further evaluation, including  potential ultrasound with biopsy versus breast MR. These results  will be called to the ordering clinician or representative by the  Radiologist Assistant, and communication documented in the  PACS or  zVision Dashboard.  Aortic Atherosclerosis (ICD10-I70.0).  Electronically Signed  By: Julian Hy M.D.  On: 05/06/2019 18:11   CT ABDOMEN IMPRESSION 03/2019: Chest Impression:  1. Mild RIGHT basilar atelectasis and small RIGHT effusion.  2. Increased skin thickening in the lateral RIGHT breast. Recommend  clinical correlation.  3. Stable seroma in the RIGHT breast.   Abdomen / Pelvis Impression:  1. No acute findings in the abdomen pelvis.  2. Nonobstructing calculi in the LEFT renal pelvis.  3. Degenerative endplate change in the lumbar spine similar to  comparison CT 2019.  4. Coronary artery calcification and Aortic Atherosclerosis  (ICD10-I70.0).  Electronically Signed  By:  Suzy Bouchard M.D.  On: 03/17/2019 09:59   ABD [place] 02/17/2019  Other findings: There is a somewhat lobulated fluid collection  identified along the inferior aspect of the liver. This measures 2.6  x 3.4 x 4.3 cm. This was not seen on the prior CT examination and  may be related to a focal fluid collection from prior  cholecystectomy. Mild perihepatic ascites is noted.  Note is made of right-sided pleural effusion.  IMPRESSION:  Status post cholecystectomy. There is a lobulated cystic area as  described above along the inferior right hepatic margin. This could  be related to the prior surgery possibly representing a small  seroma. CT may be helpful for further evaluation.  Small right pleural effusion.  Mild ascites.  Increased echogenicity within the liver likely related to fatty  infiltration or underlying hepatocellular disease.  Electronically Signed  By: Inez Catalina M.D.  On: 02/17/2019 10:18    Breast Ultrasound 01/2019  IMPRESSION:  1. New irregular hypoechoic mass along the posterior margin of the  scar right breast 8 o'clock position. Findings are nonspecific  however concerning for the possibility of localized recurrence.  2. Within the outer aspect of the right axilla there are two  adjacent irregular hypoechoic masses just deep to the skin which are  nonspecific. These may represent focal fat necrosis or potentially  recurrent or metastatic disease.  RECOMMENDATION:  1. Ultrasound-guided core needle biopsy right breast mass 8 o'clock  position just deep to the scar.  2. Ultrasound-guided core needle biopsy of both irregular hypoechoic  masses within the outer aspect of the right axilla.    MYOVIEW FINDINGS FROM 06/2013:  Normal resting EKG. Slight ST depressions noted in aVL after  administration of lexiscan. Mild shortness of breath and nausea  resolved after the test. EKG is nondiagnostic for ischemia. TID  ratio 1.05. Lung-heart ratio 0.43. Normal  ventricular chamber size.  IMPRESSION:  No evidence for ischemia. Normal wall motion. LVEF 72%.  Electronically Signed  By: Guy Sandifer    Cardiac Cath: 02/23/2013  Left mainstem: Normal  Left anterior descending (LAD): Severe proximal and mid calcification with long proximal 95% stenosis. The mid and distal vessel is small but free of high grade disease.  Left circumflex (LCx): AV groove has a mid 90% stenosis before a moderate sized MOM. OM1 and OM2 are occluded at the ostium and fill via the SVG. OM3 occluded at the ostium and fills via SVG. The grafted OMs are small and diffusely disease.  Right coronary artery (RCA): Occluded in the mid vessel. The PDA is moderate sized and occluded at the ostium. There is a 70% stenosis in the PDA after the insertion of the vein graft.  Grafts:  LIMA to LAD: Patent  SVG to RCA: Occluded (from original CABG)  SVG to RCA: Patent (from  redo CABG). This is mild diffuse plaque within the vein graft.  SVG to OM3: Patent. This is mild diffuse plaque within the vein graft.  SVG sequential to OM1/OM2: Patent. There is ostial 50% stenosis. There is a patent proximal SVG stent. The native marginals are very small.  Left ventriculography: LV not injected. AVR not crossed.  Final Conclusions: Severe native vessel CAD. Patent grafts as described with nonobstructive disease in the grafts. A stent placed into the sequential SVG is patent with some disease at the ostium that on several views in not occlusive. I did compare the 2009 cath with results today. There is high grade disease in the circumflex AV groove. However, this is not changed from 2009. However, this lesion does appear to lead into a non grafted OM. If she has further symptoms I would consider PCI of the native circumflex.     Assessment/Plan:  1. Chronic diastolic HF - weight is way back down. Not really using any lasix at this time.   2. CAD - has had prior CABG along with redo CABG in 2009 -  last cath from 2014 and she is managed medically - chest pain has settled down with Imdur.   3. CKD - followed by Renal  4. Profound anemia - had prior bone marrow biopsy. Requiring more transfusions. She never had her colonoscopy last year due to her fall here in the parking lot and the contusion/concussion she had - there was talk of possible capsule EGD - however, she never heard back from them. She would like to see [facility] if needed. She is going to discuss with Dr. Lindi Adie if she needs GI evaluation.   5. Prior kidney stones - not discussed.   6. Prior pleural effusion- that required Pleurex and talc - hopefully resolved.   7. Orthostatic hypotension - on Midodrine - she has had prior injury on 2 past occasions.   8. Carotid disease - getting study updated later this week.   9. Prior breast cancer - for MRI next month.   10. PAF - in sinus - on amiodarone. Remains on Eliquis as well. Has ILR in place.   11. Chronic anticoagulation - still worrisome with the ongoing anemia - will ask Hematology if they feels she needs to go back to GI.   12. Health maintenance - has not been vaccinated - half of her family has not been - strongly encouraged social distancing/masking/staying at home. She is going to discuss with Dr. Lindi Adie about vaccination.   Current medicines are reviewed with the patient today.  The patient does not have concerns regarding medicines other than what has been noted above.  The following changes have been made:  See above.  Labs/ tests ordered today include:   No orders of the defined types were placed in this encounter.    Disposition:   FU with me in one month. Overall prognosis looks very tenuous to me.     Patient is agreeable to this plan and will call if any problems develop in the interim.   SignedTruitt Merle, NP  01/19/2020 3:30 PM  [facility] [address] [place], [place]  [address] Phone: [phone] Fax: [phone]

## 2020-01-07 ENCOUNTER — Ambulatory Visit: Payer: Medicare Other

## 2020-01-07 NOTE — Progress Notes (Signed)
[facility] Summary Report / Loop Recorder 

## 2020-01-08 ENCOUNTER — Ambulatory Visit: Payer: Medicare Other

## 2020-01-12 ENCOUNTER — Encounter: Payer: Self-pay | Admitting: Adult Health

## 2020-01-12 NOTE — Telephone Encounter (Signed)
Correction, injection not infusion.

## 2020-01-12 NOTE — Telephone Encounter (Signed)
Pt has an appt scheduled with you 9/3 and infusion after. Since she will not have MRI completed, do you want appt w/you to be r/s?   Thanks, LG

## 2020-01-14 ENCOUNTER — Ambulatory Visit: Payer: Medicare Other

## 2020-01-14 ENCOUNTER — Telehealth: Payer: Self-pay | Admitting: *Deleted

## 2020-01-14 DIAGNOSIS — D638 Anemia in other chronic diseases classified elsewhere: Secondary | ICD-10-CM

## 2020-01-14 NOTE — Telephone Encounter (Signed)
Pt will come in at 2:00 for labs tomorrow

## 2020-01-14 NOTE — Telephone Encounter (Signed)
Pt is requesting lab appt prior to iron infusion tomorrow. States she is very tired and thinks her labs are low.

## 2020-01-15 ENCOUNTER — Inpatient Hospital Stay: Payer: Medicare Other

## 2020-01-15 ENCOUNTER — Other Ambulatory Visit: Payer: Self-pay | Admitting: *Deleted

## 2020-01-15 ENCOUNTER — Other Ambulatory Visit: Payer: Self-pay

## 2020-01-15 ENCOUNTER — Inpatient Hospital Stay: Payer: Medicare Other | Attending: Adult Health

## 2020-01-15 VITALS — BP 122/70 | HR 72 | Temp 98.2°F | Resp 16 | Ht 64.0 in

## 2020-01-15 DIAGNOSIS — E119 Type 2 diabetes mellitus without complications: Secondary | ICD-10-CM | POA: Diagnosis not present

## 2020-01-15 DIAGNOSIS — Z79899 Other long term (current) drug therapy: Secondary | ICD-10-CM | POA: Insufficient documentation

## 2020-01-15 DIAGNOSIS — I129 Hypertensive chronic kidney disease with stage 1 through stage 4 chronic kidney disease, or unspecified chronic kidney disease: Secondary | ICD-10-CM | POA: Insufficient documentation

## 2020-01-15 DIAGNOSIS — E785 Hyperlipidemia, unspecified: Secondary | ICD-10-CM | POA: Diagnosis not present

## 2020-01-15 DIAGNOSIS — D509 Iron deficiency anemia, unspecified: Secondary | ICD-10-CM | POA: Insufficient documentation

## 2020-01-15 DIAGNOSIS — I4891 Unspecified atrial fibrillation: Secondary | ICD-10-CM | POA: Insufficient documentation

## 2020-01-15 DIAGNOSIS — Z170 Estrogen receptor positive status [ER+]: Secondary | ICD-10-CM | POA: Diagnosis not present

## 2020-01-15 DIAGNOSIS — I5043 Acute on chronic combined systolic (congestive) and diastolic (congestive) heart failure: Secondary | ICD-10-CM | POA: Insufficient documentation

## 2020-01-15 DIAGNOSIS — D638 Anemia in other chronic diseases classified elsewhere: Secondary | ICD-10-CM

## 2020-01-15 DIAGNOSIS — K219 Gastro-esophageal reflux disease without esophagitis: Secondary | ICD-10-CM | POA: Insufficient documentation

## 2020-01-15 DIAGNOSIS — G40909 Epilepsy, unspecified, not intractable, without status epilepticus: Secondary | ICD-10-CM | POA: Diagnosis not present

## 2020-01-15 DIAGNOSIS — Z79811 Long term (current) use of aromatase inhibitors: Secondary | ICD-10-CM | POA: Insufficient documentation

## 2020-01-15 DIAGNOSIS — Z9221 Personal history of antineoplastic chemotherapy: Secondary | ICD-10-CM | POA: Diagnosis not present

## 2020-01-15 DIAGNOSIS — Z923 Personal history of irradiation: Secondary | ICD-10-CM | POA: Diagnosis not present

## 2020-01-15 DIAGNOSIS — D631 Anemia in chronic kidney disease: Secondary | ICD-10-CM | POA: Insufficient documentation

## 2020-01-15 DIAGNOSIS — C50211 Malignant neoplasm of upper-inner quadrant of right female breast: Secondary | ICD-10-CM | POA: Diagnosis not present

## 2020-01-15 DIAGNOSIS — I2510 Atherosclerotic heart disease of native coronary artery without angina pectoris: Secondary | ICD-10-CM | POA: Insufficient documentation

## 2020-01-15 DIAGNOSIS — N184 Chronic kidney disease, stage 4 (severe): Secondary | ICD-10-CM | POA: Diagnosis not present

## 2020-01-15 LAB — CBC WITH DIFFERENTIAL (CANCER CENTER ONLY)
Abs Immature Granulocytes: 0.02 10*3/uL (ref 0.00–0.07)
Basophils Absolute: 0.1 10*3/uL (ref 0.0–0.1)
Basophils Relative: 1 %
Eosinophils Absolute: 0.2 10*3/uL (ref 0.0–0.5)
Eosinophils Relative: 4 %
HCT: 24.3 % — ABNORMAL LOW (ref 36.0–46.0)
Hemoglobin: 7.5 g/dL — ABNORMAL LOW (ref 12.0–15.0)
Immature Granulocytes: 0 %
Lymphocytes Relative: 11 %
Lymphs Abs: 0.6 10*3/uL — ABNORMAL LOW (ref 0.7–4.0)
MCH: 29.3 pg (ref 26.0–34.0)
MCHC: 30.9 g/dL (ref 30.0–36.0)
MCV: 94.9 fL (ref 80.0–100.0)
Monocytes Absolute: 0.5 10*3/uL (ref 0.1–1.0)
Monocytes Relative: 9 %
Neutro Abs: 4.4 10*3/uL (ref 1.7–7.7)
Neutrophils Relative %: 75 %
Platelet Count: 131 10*3/uL — ABNORMAL LOW (ref 150–400)
RBC: 2.56 MIL/uL — ABNORMAL LOW (ref 3.87–5.11)
RDW: 16.3 % — ABNORMAL HIGH (ref 11.5–15.5)
WBC Count: 5.8 10*3/uL (ref 4.0–10.5)
nRBC: 0 % (ref 0.0–0.2)

## 2020-01-15 LAB — PREPARE RBC (CROSSMATCH)

## 2020-01-15 LAB — SAMPLE TO BLOOD BANK

## 2020-01-15 MED ORDER — DARBEPOETIN ALFA 500 MCG/ML IJ SOSY
PREFILLED_SYRINGE | INTRAMUSCULAR | Status: AC
  Filled 2020-01-15: qty 1

## 2020-01-15 MED ORDER — DARBEPOETIN ALFA 500 MCG/ML IJ SOSY
500.0000 ug | PREFILLED_SYRINGE | Freq: Once | INTRAMUSCULAR | Status: AC
  Administered 2020-01-15: 500 ug via SUBCUTANEOUS

## 2020-01-15 MED ORDER — SODIUM CHLORIDE 0.9 % IV SOLN
Freq: Once | INTRAVENOUS | Status: AC
  Filled 2020-01-15: qty 250

## 2020-01-15 MED ORDER — SODIUM CHLORIDE 0.9 % IV SOLN
200.0000 mg | Freq: Once | INTRAVENOUS | Status: AC
  Administered 2020-01-15: 200 mg via INTRAVENOUS
  Filled 2020-01-15: qty 200

## 2020-01-15 NOTE — Telephone Encounter (Signed)
It's Aranesp that she is to resume on 01/29/2019 per your note 7/22. She usually gets infusion for Venafer.

## 2020-01-15 NOTE — Telephone Encounter (Signed)
Just following up... Do you want pt's appt r/s?

## 2020-01-15 NOTE — Patient Instructions (Signed)

## 2020-01-16 ENCOUNTER — Inpatient Hospital Stay: Payer: Medicare Other

## 2020-01-16 DIAGNOSIS — I129 Hypertensive chronic kidney disease with stage 1 through stage 4 chronic kidney disease, or unspecified chronic kidney disease: Secondary | ICD-10-CM | POA: Diagnosis not present

## 2020-01-16 DIAGNOSIS — D638 Anemia in other chronic diseases classified elsewhere: Secondary | ICD-10-CM

## 2020-01-16 DIAGNOSIS — D649 Anemia, unspecified: Secondary | ICD-10-CM

## 2020-01-16 MED ORDER — SODIUM CHLORIDE 0.9% IV SOLUTION
250.0000 mL | Freq: Once | INTRAVENOUS | Status: AC
  Administered 2020-01-16: 250 mL via INTRAVENOUS
  Filled 2020-01-16: qty 250

## 2020-01-16 MED ORDER — DIPHENHYDRAMINE HCL 25 MG PO CAPS
ORAL_CAPSULE | ORAL | Status: AC
  Filled 2020-01-16: qty 1

## 2020-01-16 MED ORDER — ACETAMINOPHEN 325 MG PO TABS
650.0000 mg | ORAL_TABLET | Freq: Once | ORAL | Status: AC
  Administered 2020-01-16: 650 mg via ORAL

## 2020-01-16 MED ORDER — DIPHENHYDRAMINE HCL 25 MG PO CAPS
25.0000 mg | ORAL_CAPSULE | Freq: Once | ORAL | Status: AC
  Administered 2020-01-16: 25 mg via ORAL

## 2020-01-16 MED ORDER — ACETAMINOPHEN 325 MG PO TABS
ORAL_TABLET | ORAL | Status: AC
  Filled 2020-01-16: qty 2

## 2020-01-16 NOTE — Patient Instructions (Signed)
https://www.[facility].org/donate-blood/blood-donation-process/what-happens-to-donated-blood/blood-transfusions/types-of-[email or web address].html"> https://www.[employer].org/donate-blood/blood-donation-process/what-happens-to-donated-blood/blood-transfusions/risks-complications.html">  Blood Transfusion, Adult, Care After This sheet gives you information about how to care for yourself after your procedure. Your health care provider may also give you more specific instructions. If you have problems or questions, contact your health care provider. What can I expect after the procedure? After the procedure, it is common to have:  Bruising and soreness where the IV was inserted.  A fever or chills on the day of the procedure. This may be your body's response to the new blood cells received.  A headache. Follow these instructions at home: IV insertion site care      Follow instructions from your health care provider about how to take care of your IV insertion site. Make sure you: ? Wash your hands with soap and water before and after you change your bandage (dressing). If soap and water are not available, use hand sanitizer. ? Change your dressing as told by your health care provider.  Check your IV insertion site every day for signs of infection. Check for: ? Redness, swelling, or pain. ? Bleeding from the site. ? Warmth. ? Pus or a bad smell. General instructions  Take over-the-counter and prescription medicines only as told by your health care provider.  Rest as told by your health care provider.  Return to your normal activities as told by your health care provider.  Keep all follow-up visits as told by your health care provider. This is important. Contact a health care provider if:  You have itching or red, swollen areas of skin (hives).  You feel anxious.  You feel weak after doing your normal activities.  You have redness, swelling, warmth, or pain around the IV  insertion site.  You have blood coming from the IV insertion site that does not stop with pressure.  You have pus or a bad smell coming from your IV insertion site. Get help right away if:  You have symptoms of a serious allergic or immune system reaction, including: ? Trouble breathing or shortness of breath. ? Swelling of the face or feeling flushed. ? Fever or chills. ? Pain in the head, back, or chest. ? Dark urine or blood in the urine. ? Widespread rash. ? Fast heartbeat. ? Feeling dizzy or light-headed. If you receive your blood transfusion in an outpatient setting, you will be told whom to contact to report any reactions. These symptoms may represent a serious problem that is an emergency. Do not wait to see if the symptoms will go away. Get medical help right away. Call your local emergency services (911 in the [place] Do not drive yourself to the hospital. Summary  Bruising and tenderness around the IV insertion site are common.  Check your IV insertion site every day for signs of infection.  Rest as told by your health care provider. Return to your normal activities as told by your health care provider.  Get help right away for symptoms of a serious allergic or immune system reaction to blood transfusion. This information is not intended to replace advice given to you by your health care provider. Make sure you discuss any questions you have with your health care provider. Document Revised: 11/20/2018 Document Reviewed: 11/20/2018 Elsevier Patient Education  2020 [employer]  

## 2020-01-18 ENCOUNTER — Ambulatory Visit: Payer: Medicare Other | Admitting: Nurse Practitioner

## 2020-01-18 LAB — TYPE AND SCREEN
ABO/RH(D): O POS
Antibody Screen: NEGATIVE
Unit division: 0
Unit division: 0

## 2020-01-18 LAB — BPAM RBC
Blood Product Expiration Date: 202109062359
Blood Product Expiration Date: 202109062359
ISSUE DATE / TIME: 202108070925
ISSUE DATE / TIME: 202108070925
Unit Type and Rh: 5100
Unit Type and Rh: 5100

## 2020-01-19 ENCOUNTER — Ambulatory Visit (INDEPENDENT_AMBULATORY_CARE_PROVIDER_SITE_OTHER): Payer: Medicare Other | Admitting: Nurse Practitioner

## 2020-01-19 ENCOUNTER — Other Ambulatory Visit: Payer: Self-pay

## 2020-01-19 ENCOUNTER — Encounter: Payer: Self-pay | Admitting: Nurse Practitioner

## 2020-01-19 VITALS — BP 90/42 | HR 70 | Ht 64.0 in | Wt 137.0 lb

## 2020-01-19 DIAGNOSIS — R55 Syncope and collapse: Secondary | ICD-10-CM

## 2020-01-19 DIAGNOSIS — I5032 Chronic diastolic (congestive) heart failure: Secondary | ICD-10-CM | POA: Diagnosis not present

## 2020-01-19 DIAGNOSIS — I6523 Occlusion and stenosis of bilateral carotid arteries: Secondary | ICD-10-CM

## 2020-01-19 DIAGNOSIS — I480 Paroxysmal atrial fibrillation: Secondary | ICD-10-CM

## 2020-01-19 DIAGNOSIS — I951 Orthostatic hypotension: Secondary | ICD-10-CM

## 2020-01-19 DIAGNOSIS — Z79899 Other long term (current) drug therapy: Secondary | ICD-10-CM | POA: Diagnosis not present

## 2020-01-19 DIAGNOSIS — N184 Chronic kidney disease, stage 4 (severe): Secondary | ICD-10-CM

## 2020-01-19 DIAGNOSIS — I2510 Atherosclerotic heart disease of native coronary artery without angina pectoris: Secondary | ICD-10-CM

## 2020-01-19 DIAGNOSIS — Z952 Presence of prosthetic heart valve: Secondary | ICD-10-CM

## 2020-01-19 NOTE — Patient Instructions (Addendum)
After Visit Summary:  We will be checking the following labs today - NONE   Medication Instructions:    Continue with your current medicines.    If you need a refill on your cardiac medications before your next appointment, please call your pharmacy.     Testing/Procedures To Be Arranged:  N/A  Follow-Up:   See me in a month  See Dr. Lindi Adie as planned - ask about getting vaccinated and if you need to see GI referral    At [facility], you and your health needs are our priority.  As part of our continuing mission to provide you with exceptional heart care, we have created designated Provider Care Teams.  These Care Teams include your primary Cardiologist (physician) and Advanced Practice Providers (APPs -  Physician Assistants and Nurse Practitioners) who all work together to provide you with the care you need, when you need it.  Special Instructions:  . Stay safe, wash your hands for at least 20 seconds and wear a mask when needed.  . It was good to talk with you today.    Call the [place] office at [phone] if you have any questions, problems or concerns.

## 2020-01-20 ENCOUNTER — Emergency Department (HOSPITAL_BASED_OUTPATIENT_CLINIC_OR_DEPARTMENT_OTHER): Payer: Medicare Other

## 2020-01-20 ENCOUNTER — Other Ambulatory Visit: Payer: Self-pay

## 2020-01-20 ENCOUNTER — Emergency Department (HOSPITAL_BASED_OUTPATIENT_CLINIC_OR_DEPARTMENT_OTHER)
Admission: EM | Admit: 2020-01-20 | Discharge: 2020-01-20 | Disposition: A | Discharge status: Home / Self Care | Payer: Medicare Other | Attending: Emergency Medicine | Admitting: Emergency Medicine

## 2020-01-20 ENCOUNTER — Telehealth: Payer: Self-pay | Admitting: *Deleted

## 2020-01-20 ENCOUNTER — Encounter (HOSPITAL_BASED_OUTPATIENT_CLINIC_OR_DEPARTMENT_OTHER): Payer: Self-pay | Admitting: Emergency Medicine

## 2020-01-20 DIAGNOSIS — E1122 Type 2 diabetes mellitus with diabetic chronic kidney disease: Secondary | ICD-10-CM | POA: Insufficient documentation

## 2020-01-20 DIAGNOSIS — Y929 Unspecified place or not applicable: Secondary | ICD-10-CM | POA: Diagnosis not present

## 2020-01-20 DIAGNOSIS — I130 Hypertensive heart and chronic kidney disease with heart failure and stage 1 through stage 4 chronic kidney disease, or unspecified chronic kidney disease: Secondary | ICD-10-CM | POA: Insufficient documentation

## 2020-01-20 DIAGNOSIS — J45909 Unspecified asthma, uncomplicated: Secondary | ICD-10-CM | POA: Diagnosis not present

## 2020-01-20 DIAGNOSIS — S060X0A Concussion without loss of consciousness, initial encounter: Secondary | ICD-10-CM | POA: Diagnosis not present

## 2020-01-20 DIAGNOSIS — Z794 Long term (current) use of insulin: Secondary | ICD-10-CM | POA: Insufficient documentation

## 2020-01-20 DIAGNOSIS — Y999 Unspecified external cause status: Secondary | ICD-10-CM | POA: Diagnosis not present

## 2020-01-20 DIAGNOSIS — N184 Chronic kidney disease, stage 4 (severe): Secondary | ICD-10-CM | POA: Diagnosis not present

## 2020-01-20 DIAGNOSIS — Z7901 Long term (current) use of anticoagulants: Secondary | ICD-10-CM | POA: Insufficient documentation

## 2020-01-20 DIAGNOSIS — Y939 Activity, unspecified: Secondary | ICD-10-CM | POA: Diagnosis not present

## 2020-01-20 DIAGNOSIS — S22009A Unspecified fracture of unspecified thoracic vertebra, initial encounter for closed fracture: Secondary | ICD-10-CM | POA: Diagnosis not present

## 2020-01-20 DIAGNOSIS — I2510 Atherosclerotic heart disease of native coronary artery without angina pectoris: Secondary | ICD-10-CM | POA: Diagnosis not present

## 2020-01-20 DIAGNOSIS — W01198A Fall on same level from slipping, tripping and stumbling with subsequent striking against other object, initial encounter: Secondary | ICD-10-CM | POA: Diagnosis not present

## 2020-01-20 DIAGNOSIS — S3992XA Unspecified injury of lower back, initial encounter: Secondary | ICD-10-CM | POA: Diagnosis present

## 2020-01-20 DIAGNOSIS — I5032 Chronic diastolic (congestive) heart failure: Secondary | ICD-10-CM | POA: Insufficient documentation

## 2020-01-20 DIAGNOSIS — S32010A Wedge compression fracture of first lumbar vertebra, initial encounter for closed fracture: Secondary | ICD-10-CM

## 2020-01-20 DIAGNOSIS — W19XXXA Unspecified fall, initial encounter: Secondary | ICD-10-CM

## 2020-01-20 DIAGNOSIS — S32019A Unspecified fracture of first lumbar vertebra, initial encounter for closed fracture: Secondary | ICD-10-CM | POA: Diagnosis not present

## 2020-01-20 MED ORDER — HYDROCODONE-ACETAMINOPHEN 5-325 MG PO TABS
1.0000 | ORAL_TABLET | Freq: Once | ORAL | Status: AC
  Administered 2020-01-20: 1 via ORAL
  Filled 2020-01-20: qty 1

## 2020-01-20 MED ORDER — MORPHINE SULFATE (PF) 4 MG/ML IV SOLN
4.0000 mg | Freq: Once | INTRAVENOUS | Status: AC
  Administered 2020-01-20: 4 mg via INTRAMUSCULAR
  Filled 2020-01-20: qty 1

## 2020-01-20 MED ORDER — OXYCODONE-ACETAMINOPHEN 5-325 MG PO TABS: 1.0000 | ORAL_TABLET | ORAL | 0 refills | Status: DC | PRN

## 2020-01-20 MED ORDER — ONDANSETRON 4 MG PO TBDP: 4.0000 mg | ORAL_TABLET | Freq: Three times a day (TID) | ORAL | 0 refills | Status: DC | PRN

## 2020-01-20 MED ORDER — ONDANSETRON HCL 4 MG PO TABS
4.0000 mg | ORAL_TABLET | Freq: Once | ORAL | Status: DC
  Filled 2020-01-20: qty 1

## 2020-01-20 MED ORDER — ONDANSETRON 4 MG PO TBDP
4.0000 mg | ORAL_TABLET | Freq: Once | ORAL | Status: AC
  Administered 2020-01-20: 4 mg via ORAL
  Filled 2020-01-20: qty 1

## 2020-01-20 NOTE — ED Notes (Signed)
Patient transported to X-ray 

## 2020-01-20 NOTE — ED Notes (Signed)
[facility] ortho tech service has been called and the TLSO brace has been ordered.  He will be here with it asap.

## 2020-01-20 NOTE — Discharge Instructions (Addendum)
Hold Eliquis today.  The pain medications will cause constipation.  Take some over the counter stool softeners and extra fiber to prevent this from happening.

## 2020-01-20 NOTE — Progress Notes (Signed)
Orthopedic Tech Progress Note Patient Details:  Hannah [name] 16-Nov-1945 [id number] Called in order to [facility] for TLSO Patient ID: Hannah [name], female   DOB: 1945-08-20, 74 y.o.   MRN: [id number]   Janit Pagan 01/20/2020, 11:40 AM

## 2020-01-20 NOTE — ED Provider Notes (Signed)
[facility] EMERGENCY DEPARTMENT Provider Note   CSN: [id number] Arrival date & time: 01/20/20  4854     History Chief Complaint  Patient presents with  . Fall    Hannah [name] is a 74 y.o. female.  Pt presents to the ED today with a fall.  Pt has neuropathy from chemo and her right leg gave out and she fell on her buttocks and her head hit the closet door.  She did not have a loc, but has been nauseated and vomited twice.  She is on Eliquis for afib.  She called her pcp who told her to come to the ED.  She's been able to ambulate, but has pain in her entire back.        Past Medical History:  Diagnosis Date  . Abnormally small mouth   . Allergic rhinitis 10/14/2009   Qualifier: Diagnosis of  By: Lamonte Sakai MD, Rose Fillers   Overview:  Overview:  Qualifier: Diagnosis of  By: Lamonte Sakai MD, Rose Fillers  Last Assessment & Plan:  Please continue Xyzal and Nasacort as you have been using them  . Anemia   . Asthma 05/12/2009   10/12/2014 p extensive coaching HFA effectiveness =    75% s spacer    Overview:  Overview:  10/12/2014 p extensive coaching HFA effectiveness =    75% s spacer   Last Assessment & Plan:  Please continue Symbicort 2 puffs twice a day. Remember to rinse and gargle after taking this medication. Take albuterol 2 puffs up to every 4 hours if needed for shortness of breath.  Follow with Dr Lamonte Sakai in 6 month  . Bilateral carotid artery stenosis    a. mild 1-39% by duplex 10/2017, due 2021.  . Breast cancer ([place]) 01/18/2016   right breast  . CAD (coronary artery disease) 01/24/2011   a. s/p CABG 1998 with redo 2009.  Marland Kitchen Chemotherapy induced nausea and vomiting 02/24/2016  . 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 Chronic diastolic CHF (congestive heart failure) ([place]) 09/24/2013  . CKD (chronic kidney disease), stage IV ([place]) 09/24/2013  . Closed fracture of head of left humerus 09/09/2017  . Complication of  anesthesia   . Dysrhythmia    a-fib  . Gallstones   . GERD (gastroesophageal reflux disease)   . Glaucoma   . H/O atrial tachycardia 05/12/2009   Qualifier: History of  By: Lamonte Sakai MD, Rose Fillers   Overview:  Overview:  Qualifier: History of  By: Lamonte Sakai MD, Rose Fillers  Last Assessment & Plan:  There was no evidence of this on her cardiac monitor. I would recommend watchful waiting.   Marland Kitchen Heart murmur   . History of kidney stones   . History of non-ST elevation myocardial infarction (NSTEMI)    Sept 2014--  thought to be type II HTN w/ LHC without infarct related artery and patent grafts  . History of radiation therapy 05/24/16-07/26/16   right breast 50.4 Gy in 28 fractions, right breast boost 10 Gy in 5 fractions  . Hyperlipidemia   . Hypotension   . Iron deficiency anemia   . Mania ([place]) 03/05/2016  . Moderate persistent asthma    pulmologist-  Dr. Malvin Johns  . Osteomyelitis of toe of left foot ([place]) 04/06/2016  . Osteopenia of multiple sites 10/19/2015  . PAF (paroxysmal atrial fibrillation) ([place])   . Personal history of chemotherapy 2017  . Personal history of radiation therapy 2017  . PONV (postoperative nausea  and vomiting)   . Port catheter in place 02/17/2016  . Psoriasis    right leg  . Renal calculus, right   . S/P AVR    prosthesis valve placement 2009 at same time re-do CABG  . Seizure-like activity ([place]) 09/01/2017  . Sensorineural hearing loss (SNHL) of both ears 01/06/2016  . Stroke [facility]) 2014   residual rt hearing loss  . Syncope 09/06/2017  . Type 2 diabetes mellitus ([place])    monitored by dr Dwyane Dee    Patient Active Problem List   Diagnosis Date Noted  . Pain in left shoulder 03/26/2019  . History of breast cancer in female 03/26/2019  . Hypokalemia 02/20/2019  . Acute exacerbation of CHF (congestive heart failure) ([place]) 02/19/2019  . Pleural effusion on right 09/25/2018  . Thrombocytopenia ([place]) 09/25/2018  . Cholecystitis with cholelithiasis 07/07/2018  . Prolonged Q-T  interval on ECG 05/21/2018  . Atrial fibrillation ([place]) 01/23/2018  . Closed fracture of head of left humerus 09/09/2017  . Syncope 09/06/2017  . Seizure-like activity ([place]) 09/01/2017  . AKI (acute kidney injury) ([place]) 09/01/2017  . Adrenal insufficiency ([place]) 07/03/2016  . Seizure ([place]) 06/29/2016  . Chemotherapy-induced peripheral neuropathy ([place]) 04/27/2016  . 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 Osteomyelitis of toe of left foot ([place]) 04/06/2016  . Mania ([place]) 03/05/2016  . GERD (gastroesophageal reflux disease) 02/29/2016  . Chemotherapy induced nausea and vomiting 02/24/2016  . Port catheter in place 02/17/2016  . Sensorineural hearing loss (SNHL) of both ears 01/06/2016  . Breast cancer of upper-inner quadrant of right female breast ([place]) 12/16/2015  . Osteopenia of multiple sites 10/19/2015  . CKD (chronic kidney disease), stage IV ([place]) 09/24/2013  . Chronic diastolic CHF (congestive heart failure) ([place]) 09/24/2013  . Acute on chronic combined systolic and diastolic congestive heart failure ([place]) 07/17/2013  . Atrial tachycardia ([place]) 04/03/2013  . CAD (coronary artery disease) 01/24/2011  . Chronic coronary artery disease 01/24/2011  . History of aortic valve replacement 10/27/2010  . Hyperlipidemia   . DOE (dyspnea on exertion)   . Nonischemic cardiomyopathy ([place])   . Anemia of chronic disease   . Type 2 diabetes mellitus ([place])   . Iron deficiency anemia   . Allergic rhinitis 10/14/2009  . Essential hypertension 05/12/2009  . H/O atrial tachycardia 05/12/2009  . Asthma 05/12/2009    Past Surgical History:  Procedure Laterality Date  . AORTIC VALVE REPLACEMENT  [facility]   [facility]done same time is [facility]G)  . BREAST LUMPECTOMY Right 01/18/2016  . BREAST LUMPECTOMY WITH RADIOACTIVE SEED AND SENTINEL LYMPH NODE BIOPSY Right 01/18/2016   Procedure: RIGHT BREAST LUMPECTOMY WITH RADIOACTIVE SEED AND SENTINEL LYMPH NODE BIOPSY;   Surgeon: DAlphonsa Overall MD;  [facility]  Service: General;  Laterality: Right;  . CARDIAC CATHETERIZATION  03/23/2008   Pre-redo CABG: L main OK, LAD (T), CFX (T), OM1 99%, RCA (T), LIMA-LAD OK, SVG-OM(?3) OK w/ little florw to OM2, SVG-RCA OK. EF NL  . CARPAL TUNNEL RELEASE    . CHEST TUBE INSERTION Right 06/26/2019   Procedure: INSERTION PLEURAL DRAINAGE CATHETER;  Surgeon: BGaye Pollack MD;  Location: [facility]  Service: Thoracic;  Laterality: Right;  . CHOLECYSTECTOMY N/A 07/07/2018   Procedure: LAPAROSCOPIC CHOLECYSTECTOMY WITH INTRAOPERATIVE CHOLANGIOGRAM ERAS PATHWAY;  Surgeon: NAlphonsa Overall MD;  Location: [facility];  Service: General;  Laterality: N/A;  . COLONOSCOPY     around 2015. Possibly with [facility]   . CORONARY ARTERY BYPASS GRAFT  1998 &  re-do 2009   Had LIMA to DX/LAD, SVG to 2 marginal branches and SVG to [facility] originally; SVG to 3rd OM and PD at time of redo  . CYSTOSCOPY W/ URETERAL STENT PLACEMENT Right 12/20/2014   Procedure: CYSTOSCOPY WITH RETROGRADE PYELOGRAM/URETERAL STENT PLACEMENT;  Surgeon: Cleon Gustin, MD;  Location: [facility];  Service: Urology;  Laterality: Right;  . CYSTOSCOPY WITH RETROGRADE PYELOGRAM, URETEROSCOPY AND STENT PLACEMENT Left 08/21/2019   Procedure: CYSTOSCOPY WITH RETROGRADE PYELOGRAM, URETEROSCOPY AND STENT PLACEMENT;  Surgeon: Cleon Gustin, MD;  [facility];  Service: Urology;  Laterality: Left;  1 HR  . ESOPHAGOGASTRODUODENOSCOPY     many years ago per patient   . ESOPHAGOGASTRODUODENOSCOPY ENDOSCOPY  06/17/2018  . EYE SURGERY Bilateral    cataracts  . HOLMIUM LASER APPLICATION Right 2/97/9892   Procedure:  HOLMIUM LASER LITHOTRIPSY;  Surgeon: Cleon Gustin, MD;  Location: [facility];  Service: Urology;  Laterality: Right;  . HOLMIUM LASER APPLICATION Left 06/29/4172   Procedure: HOLMIUM LASER APPLICATION;  Surgeon: Cleon Gustin, MD;  [facility];  Service: Urology;  Laterality:  Left;  . IR THORACENTESIS ASP PLEURAL SPACE W/IMG GUIDE  10/03/2018  . IR THORACENTESIS ASP PLEURAL SPACE W/IMG GUIDE  06/08/2019  . LEFT HEART CATHETERIZATION WITH CORONARY/GRAFT ANGIOGRAM N/A 02/23/2013   Procedure: LEFT HEART CATHETERIZATION WITH Beatrix Fetters;  Surgeon: Blane Ohara, MD;  Location: [facility] CATH LAB;  Service: Cardiovascular;  Laterality: N/A;  . LOOP RECORDER INSERTION N/A 08/30/2017   Procedure: LOOP RECORDER INSERTION;  Surgeon: Evans Lance, MD;  Location: [facility] CV LAB;  Service: Cardiovascular;  Laterality: N/A;  . PORTACATH PLACEMENT Left 01/18/2016   Procedure: INSERTION PORT-A-CATH;  Surgeon: Alphonsa Overall, MD;  Location: [facility];  Service: General;  Laterality: Left;  . portacath removal    . REMOVAL OF PLEURAL DRAINAGE CATHETER Right 07/14/2019   Procedure: REMOVAL OF PLEURAL DRAINAGE CATHETER;  Surgeon: Gaye Pollack, MD;  Location: [facility];  Service: Thoracic;  Laterality: Right;  . TALC PLEURODESIS N/A 06/26/2019   Procedure: Pietro Cassis;  Surgeon: Gaye Pollack, MD;  Location: [facility];  Service: Thoracic;  Laterality: N/A;  . TONSILLECTOMY    . TRANSTHORACIC ECHOCARDIOGRAM  02-24-2013      mild LVH,  ef 50-55%/  AV bioprosthesis was present with very mild stenosis and no regurg., mean grandient 76mHg, peak grandient 256mg /  mild MR/  mild LAE and RAE/  moderate TR  . TUBAL LIGATION       OB History   No obstetric history on file.     Family History  Problem Relation Age of Onset  . Heart disease Father   . Heart failure Father   . Diabetes Maternal Grandmother   . Heart disease Maternal Grandmother   . Diabetes Son   . Healthy Brother        #1  . Heart attack Brother        #2  . Heart disease Brother        #2  . Colon cancer Neg Hx   . Esophageal cancer Neg Hx     Social History   Tobacco Use  . Smoking status: Never Smoker  . Smokeless tobacco: Never Used  Vaping Use  . Vaping Use: Never used  Substance Use Topics    . Alcohol use: No  . Drug use: No    Home Medications Prior to Admission medications  Medication Sig Start Date End Date Taking? Authorizing Provider  ACCU-CHEK GUIDE test strip Use to test blood sugar 3 times daily 10/02/19   Elayne Snare, MD  acetaminophen (TYLENOL) 325 MG tablet Take 2 tablets (650 mg total) by mouth every 6 (six) hours as needed for mild pain (or Fever >/= 101). 09/03/17   Eulogio Bear U, DO  albuterol (VENTOLIN HFA) 108 (90 Base) MCG/ACT inhaler Inhale 2 puffs into the lungs every 6 (six) hours as needed for wheezing or shortness of breath. 10/07/19   Collene Gobble, MD  amiodarone (PACERONE) 200 MG tablet Take 1 tablet (200 mg total) by mouth daily. 02/03/19   Evans Lance, MD  Blood Glucose Monitoring Suppl (ACCU-CHEK GUIDE ME) w/Device KIT Use accu chek meter to check blood sugar three times daily. 04/10/19   Elayne Snare, MD  budesonide-formoterol [facility]) 160-4.5 MCG/ACT inhaler Inhale 2 puffs into the lungs 2 (two) times daily. 10/07/19   Collene Gobble, MD  Dulaglutide (TRULICITY) 1.5 EQ/6.8TM SOPN Inject 1.5 mg into the skin once a week. 10/28/19   Elayne Snare, MD  ELIQUIS 5 MG TABS tablet Take 1 tablet (5 mg total) by mouth 2 (two) times daily. 09/23/19   Evans Lance, MD  exemestane (AROMASIN) 25 MG tablet TAKE ONE TABLET BY MOUTH DAILY AFTER BREAKFAST 09/23/19   Nicholas Lose, MD  ezetimibe (ZETIA) 10 MG tablet TAKE ONE TABLET BY MOUTH EVERY DAY 10/30/19   [name], NP  famotidine (PEPCID) 20 MG tablet Take 20 mg by mouth 2 (two) times daily.    [provider]  furosemide (LASIX) 20 MG tablet Take 20 mg by mouth daily as needed.    [provider]  insulin glargine (LANTUS) 100 UNIT/ML injection Inject 18 Units into the skin daily.    [provider]  Insulin Lispro (HUMALOG KWIKPEN [place]) Inject 6-8 Units into the skin 3 (three) times daily.     [provider]  Insulin Pen Needle (BD PEN NEEDLE NANO U/F) 32G X 4 MM  MISC USE AS INSTRUCTED TO INJECT INSULIN 4 TIMES DAILY. 09/16/19   Elayne Snare, MD  isosorbide mononitrate (IMDUR) 30 MG 24 hr tablet Take 1 tablet (30 mg total) by mouth daily. 11/04/19 02/02/20  [name], NP  loratadine (CLARITIN) 10 MG tablet Take 10 mg by mouth daily.     [provider]  metoprolol tartrate (LOPRESSOR) 25 MG tablet Take 1 tablet (25 mg total) by mouth 2 (two) times daily. Extra dose if needed for palpitations. 12/22/19   [name], NP  midodrine (PROAMATINE) 2.5 MG tablet Take 2.5 mg by mouth 3 (three) times daily with meals.    [provider]  nitroGLYCERIN (NITROSTAT) 0.4 MG SL tablet Place 1 tablet (0.4 mg total) under the tongue every 5 (five) minutes as needed for chest pain. 12/22/19 03/21/20  [name], NP  ondansetron (ZOFRAN ODT) 4 MG disintegrating tablet Take 1 tablet (4 mg total) by mouth every 8 (eight) hours as needed. 01/20/20   Isla Pence, MD  oxyCODONE-acetaminophen (PERCOCET/ROXICET) 5-325 MG tablet Take 1 tablet by mouth every 4 (four) hours as needed for severe pain. 01/20/20   Isla Pence, MD  rosuvastatin (CRESTOR) 40 MG tablet Take 1 tablet (40 mg total) by mouth at bedtime. 03/11/19   [name], NP  zolpidem (AMBIEN) 5 MG tablet Take 5 mg by mouth at bedtime.     [provider]    Allergies  Amoxicillin, Tape, Aldactone [spironolactone], Arimidex [anastrozole], Latex, and Tetracycline  Review of Systems   Review of Systems  Musculoskeletal: Positive for back pain.  Neurological: Positive for headaches.  All other systems reviewed and are negative.   Physical Exam Updated Vital Signs BP (!) 165/83 (BP Location: Left Arm)   Pulse 83   Temp 98.1 F (36.7 C) (Oral)   Resp 16   Ht _0  (1.626 m)   Wt 62.1 kg   LMP  (LMP Unknown)   SpO2 97%   BMI 23.52 kg/m   Physical Exam Vitals and nursing note reviewed.  Constitutional:      Appearance: Normal appearance.  HENT:     Head:  Normocephalic.      Right Ear: External ear normal.     Left Ear: External ear normal.     Nose: Nose normal.     Mouth/Throat:     Mouth: Mucous membranes are dry.     Pharynx: Oropharynx is clear.  Eyes:     Extraocular Movements: Extraocular movements intact.     Pupils: Pupils are equal, round, and reactive to light.  Neck:   Cardiovascular:     Rate and Rhythm: Normal rate. Rhythm irregular.     Pulses: Normal pulses.     Heart sounds: Normal heart sounds.  Pulmonary:     Effort: Pulmonary effort is normal.     Breath sounds: Normal breath sounds.  Abdominal:     General: Abdomen is flat. Bowel sounds are normal.     Palpations: Abdomen is soft.  Musculoskeletal:       Back:  Skin:    General: Skin is warm.     Capillary Refill: Capillary refill takes less than 2 seconds.  Neurological:     General: No focal deficit present.     Mental Status: She is alert and oriented to person, place, and time.  Psychiatric:        Mood and Affect: Mood normal.        Behavior: Behavior normal.     ED Results / Procedures / Treatments   Labs (all labs ordered are listed, but only abnormal results are displayed) Labs Reviewed - No data to display  EKG None  Radiology DG Thoracic Spine 2 View  Result Date: 01/20/2020 CLINICAL DATA:  Fall EXAM: THORACIC SPINE 2 VIEWS COMPARISON:  None. FINDINGS: Normal thoracic kyphosis. No fracture or dislocation is seen. Mild degenerative changes of the thoracic spine. 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 Median sternotomy. IMPRESSION: Negative. Electronically Signed   By: Julian Hy M.D.   On: 01/20/2020 11:04   DG Lumbar Spine Complete  Result Date: 01/20/2020 CLINICAL DATA:  Fall EXAM: LUMBAR SPINE - COMPLETE 4+ VIEW COMPARISON:  CT abdomen/pelvis dated 10/30/2019 FINDINGS: Five lumbar-type vertebral bodies. Normal lumbar lordosis. Mild superior endplate compression fracture deformity at L1, age  indeterminate, but new from prior CT. No retropulsion. Mild superior/inferior endplate changes at L3, corresponding to Schmorl's node changes on prior CT, chronic. Mild to moderate degenerative changes of the lower lumbar spine. Visualized bony pelvis appears intact. IMPRESSION: Mild superior endplate compression fracture deformity at L1, age indeterminate but new from prior CT. No retropulsion. Correlate for point tenderness. Mild to moderate degenerative changes of the lower lumbar spine. Electronically Signed   By: Julian Hy M.D.   On: 01/20/2020 11:06   DG Sacrum/Coccyx  Result Date: 01/20/2020 CLINICAL DATA:  Fall EXAM: SACRUM AND COCCYX - 2+ VIEW COMPARISON:  None. FINDINGS: Nondisplaced fracture of the left transverse process at L5. Mild irregularity involving the left sacral ala, although possibly involving overlying bowel-gas, indeterminate. Visualized bony pelvis is otherwise intact. Bilateral hip joint spaces are preserved. No displaced sacrococcygeal fracture is evident on the lateral view. IMPRESSION: Nondisplaced fracture of the left transverse process at L5. Mild irregularity involving the left sacral ala, equivocal. Electronically Signed   By: Julian Hy M.D.   On: 01/20/2020 11:09   CT Head Wo Contrast  Result Date: 01/20/2020 CLINICAL DATA:  Fall EXAM: CT HEAD WITHOUT CONTRAST TECHNIQUE: Contiguous axial images were obtained from the base of the skull through the vertex without intravenous contrast. COMPARISON:  2012 FINDINGS: Brain: There is no acute intracranial hemorrhage, mass effect, or edema. Gray-white differentiation is preserved. There is no extra-axial fluid collection. Ventricles and sulci are stable in size and configuration. Minimal patchy hypoattenuation in the supratentorial white matter is nonspecific but may reflect minor chronic microvascular ischemic changes. Vascular: There is atherosclerotic calcification at the skull base. Skull: Calvarium is  unremarkable. Sinuses/Orbits: No acute finding. Other: None. IMPRESSION: No evidence of acute intracranial injury. Electronically Signed   By: [name] M.D.   On: 01/20/2020 11:17   CT Cervical Spine Wo Contrast  Result Date: 01/20/2020 CLINICAL DATA:  Fall EXAM: CT CERVICAL SPINE WITHOUT CONTRAST TECHNIQUE: Multidetector CT imaging of the cervical spine was performed without intravenous contrast. Multiplanar CT image reconstructions were also generated. COMPARISON:  None. FINDINGS: Alignment: Anteroposterior alignment is maintained. Skull base and vertebrae: Vertebral body heights are preserved. No acute cervical spine fracture. Soft tissues and spinal canal: No prevertebral fluid or swelling. No visible canal hematoma. Disc levels: Multilevel degenerative changes are present including disc space narrowing, endplate osteophytes, and facet and uncovertebral hypertrophy. There is resulting up to moderate canal stenosis. No high-grade osseous encroachment on the neural foramina. Upper chest: No apical lung mass. Other: Heavily calcified common carotid bifurcations. IMPRESSION: No acute cervical spine fracture. Electronically Signed   By: [name] M.D.   On: 01/20/2020 11:21    Procedures Procedures (including critical care time)  Medications Ordered in ED Medications  morphine 4 MG/ML injection 4 mg (has no administration in time range)  HYDROcodone-acetaminophen (NORCO/VICODIN) 5-325 MG per tablet 1 tablet (1 tablet Oral Given 01/20/20 1034)  ondansetron (ZOFRAN-ODT) disintegrating tablet 4 mg (4 mg Oral Given 01/20/20 1034)    ED Course  I have reviewed the triage vital signs and the nursing notes.  Pertinent labs & imaging results that were available during my care of the patient were reviewed by me and considered in my medical decision making (see chart for details).    MDM Rules/Calculators/A&P                          No bleeding on head CT.  N/v likely from concussion.  Pt  told to hold Eliquis today.    Pt does have a lumbar compression fx and a transverse process fx on xrays.  Pt placed in a TLSO brace and told to f/u with her orthopedist.  Pt will be d/c with percocet and zofran.  Return if worse. Final Clinical Impression(s) / ED Diagnoses Final diagnoses:  Closed fracture of transverse process of thoracic vertebra, initial encounter ([place])  Closed compression fracture of body of L1 vertebra ([place])  Fall, initial encounter  Concussion without loss of consciousness, initial encounter    Rx / DC Orders ED Discharge Orders  Ordered    ondansetron (ZOFRAN ODT) 4 MG disintegrating tablet  Every 8 hours PRN     Discontinue  Reprint     01/20/20 1135    oxyCODONE-acetaminophen (PERCOCET/ROXICET) 5-325 MG tablet  Every 4 hours PRN     Discontinue  Reprint     01/20/20 1135           Isla Pence, MD 01/20/20 1150

## 2020-01-20 NOTE — ED Triage Notes (Addendum)
Pt fell backward yesterday while walking.  Fell straight backward onto her buttocks and then kept going back, hitting her back on a wall and her head on a door.  Knot on the back of her head.  No LOC.  Has been nauseated all night.  Vomited x2. Called pmd and they sent her here. Takes Eliquis.

## 2020-01-20 NOTE — Telephone Encounter (Signed)
Pt calling in today to get spouses labs per [facility]) . Pt was at her granddaughters last night to see new house, was walking and talking went to turn and lost footing, fell, hit head on closet door, has a knot on head and back is sore.  Pt could get up with help from spouse.  Pt could not sleep all night due to pain level.  Pt stated has a phone call into PCP but has not heard back.  Pt stated was thinking about going to the med center in [place] if doctor can't see pt. Advised with [name].  Pt needs to [facility] to [facility]center this am and not wait for PCP to call back.  Pt needs head scan due to being on Eliquis w/o contrast.  Pt was seen yesterday by Kathrene Alu.  Pt will call back with update.

## 2020-01-21 ENCOUNTER — Ambulatory Visit: Payer: Medicare Other

## 2020-01-21 ENCOUNTER — Ambulatory Visit: Payer: Medicare Other | Admitting: Adult Health

## 2020-01-21 ENCOUNTER — Other Ambulatory Visit: Payer: Medicare Other

## 2020-01-22 ENCOUNTER — Ambulatory Visit: Payer: Medicare Other

## 2020-01-22 ENCOUNTER — Inpatient Hospital Stay: Payer: Medicare Other

## 2020-01-22 ENCOUNTER — Encounter: Payer: Self-pay | Admitting: Adult Health

## 2020-01-22 ENCOUNTER — Other Ambulatory Visit: Payer: Self-pay

## 2020-01-22 ENCOUNTER — Inpatient Hospital Stay (HOSPITAL_BASED_OUTPATIENT_CLINIC_OR_DEPARTMENT_OTHER): Payer: Medicare Other | Admitting: Adult Health

## 2020-01-22 VITALS — BP 94/45 | HR 72 | Temp 98.2°F | Resp 17 | Ht 64.0 in | Wt 140.9 lb

## 2020-01-22 VITALS — BP 113/40 | HR 67 | Temp 98.0°F | Resp 18

## 2020-01-22 DIAGNOSIS — Z170 Estrogen receptor positive status [ER+]: Secondary | ICD-10-CM | POA: Diagnosis not present

## 2020-01-22 DIAGNOSIS — C50211 Malignant neoplasm of upper-inner quadrant of right female breast: Secondary | ICD-10-CM

## 2020-01-22 DIAGNOSIS — I129 Hypertensive chronic kidney disease with stage 1 through stage 4 chronic kidney disease, or unspecified chronic kidney disease: Secondary | ICD-10-CM | POA: Diagnosis not present

## 2020-01-22 DIAGNOSIS — D638 Anemia in other chronic diseases classified elsewhere: Secondary | ICD-10-CM

## 2020-01-22 LAB — CMP (CANCER CENTER ONLY)
ALT: 26 U/L (ref 0–44)
AST: 27 U/L (ref 15–41)
Albumin: 2.6 g/dL — ABNORMAL LOW (ref 3.5–5.0)
Alkaline Phosphatase: 71 U/L (ref 38–126)
Anion gap: 9 (ref 5–15)
BUN: 48 mg/dL — ABNORMAL HIGH (ref 8–23)
CO2: 22 mmol/L (ref 22–32)
Calcium: 9.4 mg/dL (ref 8.9–10.3)
Chloride: 105 mmol/L (ref 98–111)
Creatinine: 3.57 mg/dL (ref 0.44–1.00)
GFR, Est AFR Am: 14 mL/min — ABNORMAL LOW (ref >60)
GFR, Estimated: 12 mL/min — ABNORMAL LOW (ref >60)
Glucose, Bld: 157 mg/dL — ABNORMAL HIGH (ref 70–99)
Potassium: 4.5 mmol/L (ref 3.5–5.1)
Sodium: 136 mmol/L (ref 135–145)
Total Bilirubin: 1.1 mg/dL (ref 0.3–1.2)
Total Protein: 6.1 g/dL — ABNORMAL LOW (ref 6.5–8.1)

## 2020-01-22 LAB — CBC WITH DIFFERENTIAL (CANCER CENTER ONLY)
Abs Immature Granulocytes: 0.02 10*3/uL (ref 0.00–0.07)
Basophils Absolute: 0.1 10*3/uL (ref 0.0–0.1)
Basophils Relative: 1 %
Eosinophils Absolute: 0.4 10*3/uL (ref 0.0–0.5)
Eosinophils Relative: 6 %
HCT: 32.9 % — ABNORMAL LOW (ref 36.0–46.0)
Hemoglobin: 10.3 g/dL — ABNORMAL LOW (ref 12.0–15.0)
Immature Granulocytes: 0 %
Lymphocytes Relative: 7 %
Lymphs Abs: 0.5 10*3/uL — ABNORMAL LOW (ref 0.7–4.0)
MCH: 30.7 pg (ref 26.0–34.0)
MCHC: 31.3 g/dL (ref 30.0–36.0)
MCV: 97.9 fL (ref 80.0–100.0)
Monocytes Absolute: 0.7 10*3/uL (ref 0.1–1.0)
Monocytes Relative: 11 %
Neutro Abs: 5.3 10*3/uL (ref 1.7–7.7)
Neutrophils Relative %: 75 %
Platelet Count: 108 10*3/uL — ABNORMAL LOW (ref 150–400)
RBC: 3.36 MIL/uL — ABNORMAL LOW (ref 3.87–5.11)
RDW: 17.9 % — ABNORMAL HIGH (ref 11.5–15.5)
WBC Count: 7 10*3/uL (ref 4.0–10.5)
nRBC: 0 % (ref 0.0–0.2)

## 2020-01-22 LAB — SAMPLE TO BLOOD BANK

## 2020-01-22 LAB — RETIC PANEL
Immature Retic Fract: 33.8 % — ABNORMAL HIGH (ref 2.3–15.9)
RBC.: 3.34 MIL/uL — ABNORMAL LOW (ref 3.87–5.11)
Retic Count, Absolute: 148.3 10*3/uL (ref 19.0–186.0)
Retic Ct Pct: 4.4 % — ABNORMAL HIGH (ref 0.4–3.1)
Reticulocyte Hemoglobin: 33.1 pg (ref >27.9)

## 2020-01-22 MED ORDER — SODIUM CHLORIDE 0.9 % IV SOLN
Freq: Once | INTRAVENOUS | Status: AC
  Filled 2020-01-22: qty 250

## 2020-01-22 MED ORDER — SODIUM CHLORIDE 0.9 % IV SOLN
200.0000 mg | Freq: Once | INTRAVENOUS | Status: AC
  Administered 2020-01-22: 200 mg via INTRAVENOUS
  Filled 2020-01-22: qty 200

## 2020-01-22 NOTE — Patient Instructions (Signed)

## 2020-01-22 NOTE — Progress Notes (Signed)
[facility] Follow up:    Hannah [name], [name] [address]   DIAGNOSIS: Cancer Staging Breast cancer of upper-inner quadrant of right female breast [facility]) Staging form: Breast, AJCC 7th Edition - Clinical stage from 12/21/2015: Stage IA (T1c, N0, M0) - Unsigned Staging comments: Staged at breast conference on 7.12.17 - Pathologic: Stage IA (T1c, N0, cM0) - Unsigned   SUMMARY OF ONCOLOGIC HISTORY: Oncology History  Breast cancer of upper-inner quadrant of right female breast ([place])  12/14/2015 Initial Diagnosis   Right breast biopsy 12:30 position: 2 masses, 1.6 cm mass: Invasive ductal carcinoma, grade 2, ER 0%, PR 0%, HER-2 negative, Ki-67 70%; satellite mass 8 mm: IDC grade 2 ER 5%, PR 5%, HER-2 negative, Ki-67 50%; T1cN0 stage IA clinical stage   01/18/2016 Surgery   Right lumpectomy: Multifocal IDC grade 3, 1.9 cm  ER 0%, PR 0%, HER-2 negative, Ki-67 70% and 0.8 cm satellite mass ER 5%, PR 2%, HER-2 negative, Ki-67 50%, high-grade DCIS, margins negative, 0/1 lymph nodes negative T1 cN0 stage IA   02/17/2016 - 04/06/2016 Chemotherapy   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    04/08/2016 - 04/23/2016 Hospital Admission   Hosp adm for cellulitis   05/24/2016 - 07/25/2016 Radiation Therapy    Adj XRT   06/29/2016 - 07/04/2016 Hospital Admission   Seizure like activity, MRI brain and EEG unremarkable    08/16/2016 -  Anti-estrogen oral therapy   Letrozole could not tolerate it due to dizziness and lightheadedness, switched to anastrozole 04/04/2017 switched to exemestane 05/22/2017     CURRENT THERAPY: IV Venofer, Exemestane, to resume Aranesp in 2 weeks  INTERVAL HISTORY: Hannah [name] 74 y.o. female returns for evaluation of her multifactorial anemia, along with her h/o breast cancer.  Unfortunately, she fell last week and had several vertebral fractures that she has had to wear a TLSO brace  for.  She is taking Eliquis, and did go to the ER and underwent CT head that was negative, which is reassuring.    Amberli has been receiving Aranesp every three weeks, with poor response.  Her iron studies were checked and revealed iron deficiency.  She has received four doses of weekly IV Venofer, and will receive her fifth dose today.  She is due to receive Aranesp next week.    She did receive two units of blood 2 weeks ago, prior to her vacation.  She felt much better after receiving this transfusion and was able to enjoy her vacation (which happened prior to the fall).  Prior to her fall, she had some breast changes and increased thickening at her right breast.  I had ordered a breast MRI, however she needed to undergo breast mammogram first.  This is scheduled for September.  She wants to keep this scheduled as such and see how her back feels closer to time.    She continues on Exemestane daily and is tolerating this well.  She notes that she has undergone bone density testing, but cannot recall when/where this was completed.  I cannot locate results in Epic for this.     Patient Active Problem List   Diagnosis Date Noted  . Pain in left shoulder 03/26/2019  . History of breast cancer in female 03/26/2019  . Hypokalemia 02/20/2019  . Acute exacerbation of CHF (congestive heart failure) ([place]) 02/19/2019  . Pleural effusion on right 09/25/2018  . Thrombocytopenia ([place]) 09/25/2018  .  Cholecystitis with cholelithiasis 07/07/2018  . Prolonged Q-T interval on ECG 05/21/2018  . Atrial fibrillation ([place]) 01/23/2018  . Closed fracture of head of left humerus 09/09/2017  . Syncope 09/06/2017  . Seizure-like activity ([place]) 09/01/2017  . AKI (acute kidney injury) ([place]) 09/01/2017  . Adrenal insufficiency ([place]) 07/03/2016  . Seizure ([place]) 06/29/2016  . Chemotherapy-induced peripheral neuropathy ([place]) 04/27/2016  . 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 Osteomyelitis of toe of left  foot ([place]) 04/06/2016  . Mania ([place]) 03/05/2016  . GERD (gastroesophageal reflux disease) 02/29/2016  . Chemotherapy induced nausea and vomiting 02/24/2016  . Port catheter in place 02/17/2016  . Sensorineural hearing loss (SNHL) of both ears 01/06/2016  . Breast cancer of upper-inner quadrant of right female breast ([place]) 12/16/2015  . Osteopenia of multiple sites 10/19/2015  . CKD (chronic kidney disease), stage IV ([place]) 09/24/2013  . Chronic diastolic CHF (congestive heart failure) ([place]) 09/24/2013  . Acute on chronic combined systolic and diastolic congestive heart failure ([place]) 07/17/2013  . Atrial tachycardia ([place]) 04/03/2013  . CAD (coronary artery disease) 01/24/2011  . Chronic coronary artery disease 01/24/2011  . History of aortic valve replacement 10/27/2010  . Hyperlipidemia   . DOE (dyspnea on exertion)   . Nonischemic cardiomyopathy ([place])   . Anemia of chronic disease   . Type 2 diabetes mellitus ([place])   . Iron deficiency anemia   . Allergic rhinitis 10/14/2009  . Essential hypertension 05/12/2009  . H/O atrial tachycardia 05/12/2009  . Asthma 05/12/2009    is allergic to amoxicillin, tape, aldactone [spironolactone], arimidex [anastrozole], latex, and tetracycline.  MEDICAL HISTORY: Past Medical History:  Diagnosis Date  . Abnormally small mouth   . Allergic rhinitis 10/14/2009   Qualifier: Diagnosis of  By: Lamonte Sakai MD, Rose Fillers   Overview:  Overview:  Qualifier: Diagnosis of  By: Lamonte Sakai MD, Rose Fillers  Last Assessment & Plan:  Please continue Xyzal and Nasacort as you have been using them  . Anemia   . Asthma 05/12/2009   10/12/2014 p extensive coaching HFA effectiveness =    75% s spacer    Overview:  Overview:  10/12/2014 p extensive coaching HFA effectiveness =    75% s spacer   Last Assessment & Plan:  Please continue Symbicort 2 puffs twice a day. Remember to rinse and gargle after taking this medication. Take albuterol 2 puffs up to every 4 hours if needed for shortness  of breath.  Follow with Dr Lamonte Sakai in 6 month  . Bilateral carotid artery stenosis    a. mild 1-39% by duplex 10/2017, due 2021.  . Breast cancer ([place]) 01/18/2016   right breast  . CAD (coronary artery disease) 01/24/2011   a. s/p CABG 1998 with redo 2009.  Marland Kitchen Chemotherapy induced nausea and vomiting 02/24/2016  . Chemotherapy-induced peripheral neuropathy ([place]) 04/27/2016  . 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 Chronic diastolic CHF (congestive heart failure) ([place]) 09/24/2013  . CKD (chronic kidney disease), stage IV ([place]) 09/24/2013  . Closed fracture of head of left humerus 09/09/2017  . Complication of anesthesia   . Dysrhythmia    a-fib  . Gallstones   . GERD (gastroesophageal reflux disease)   . Glaucoma   . H/O atrial tachycardia 05/12/2009   Qualifier: History of  By: Lamonte Sakai MD, Rose Fillers   Overview:  Overview:  Qualifier: History of  By: Lamonte Sakai MD, Rose Fillers  Last Assessment & Plan:  There was no evidence of this on her cardiac monitor. I would recommend watchful waiting.   Marland Kitchen  Heart murmur   . History of kidney stones   . History of non-ST elevation myocardial infarction (NSTEMI)    Sept 2014--  thought to be type II HTN w/ LHC without infarct related artery and patent grafts  . History of radiation therapy 05/24/16-07/26/16   right breast 50.4 Gy in 28 fractions, right breast boost 10 Gy in 5 fractions  . Hyperlipidemia   . Hypotension   . Iron deficiency anemia   . Mania ([place]) 03/05/2016  . Moderate persistent asthma    pulmologist-  Dr. Malvin Johns  . Osteomyelitis of toe of left foot ([place]) 04/06/2016  . Osteopenia of multiple sites 10/19/2015  . PAF (paroxysmal atrial fibrillation) ([place])   . Personal history of chemotherapy 2017  . Personal history of radiation therapy 2017  . PONV (postoperative nausea and vomiting)   . Port catheter in place 02/17/2016  . Psoriasis    right leg  . Renal calculus, right   . S/P AVR    prosthesis valve placement 2009 at same time re-do  CABG  . Seizure-like activity ([place]) 09/01/2017  . Sensorineural hearing loss (SNHL) of both ears 01/06/2016  . Stroke [facility]) 2014   residual rt hearing loss  . Syncope 09/06/2017  . Type 2 diabetes mellitus ([place])    monitored by dr Dwyane Dee    SURGICAL HISTORY: Past Surgical History:  Procedure Laterality Date  . AORTIC VALVE REPLACEMENT  2009   [facility]done same time is CABG)  . BREAST LUMPECTOMY Right 01/18/2016  . BREAST LUMPECTOMY WITH RADIOACTIVE SEED AND SENTINEL LYMPH NODE BIOPSY Right 01/18/2016   Procedure: RIGHT BREAST LUMPECTOMY WITH RADIOACTIVE SEED AND SENTINEL LYMPH NODE BIOPSY;  Surgeon: DAlphonsa Overall MD;  [facility]  Service: General;  Laterality: Right;  . CARDIAC CATHETERIZATION  03/23/2008   Pre-redo CABG: L main OK, LAD (T), CFX (T), OM1 99%, RCA (T), LIMA-LAD OK, SVG-OM(?3) OK w/ little florw to OM2, SVG-RCA OK. EF NL  . CARPAL TUNNEL RELEASE    . CHEST TUBE INSERTION Right 06/26/2019   Procedure: INSERTION PLEURAL DRAINAGE CATHETER;  Surgeon: BGaye Pollack MD;  [facility]  Service: Thoracic;  Laterality: Right;  . CHOLECYSTECTOMY N/A 07/07/2018   Procedure: LAPAROSCOPIC CHOLECYSTECTOMY WITH INTRAOPERATIVE CHOLANGIOGRAM ERAS PATHWAY;  Surgeon: NAlphonsa Overall MD;  [facility];  Service: General;  Laterality: N/A;  . COLONOSCOPY     around 2015. Possibly with [facility]   . CORONARY ARTERY BYPASS GRAFT  1998 &  re-do 2009   Had LIMA to DX/LAD, SVG to 2 marginal branches [facility]; SVG to 3rd OM and PD at time of redo  . CYSTOSCOPY W/ URETERAL STENT PLACEMENT Right 12/20/2014   Procedure: CYSTOSCOPY WITH RETROGRADE PYELOGRAM/URETERAL STENT PLACEMENT;  Surgeon: PCleon Gustin MD;  [facility]  Service: Urology;  Laterality: Right;  . CYSTOSCOPY WITH RETROGRADE PYELOGRAM, URETEROSCOPY AND STENT PLACEMENT Left 08/21/2019   Procedure: CYSTOSCOPY WITH RETROGRADE PYELOGRAM, URETEROSCOPY AND  STENT PLACEMENT;  Surgeon: MCleon Gustin MD;  [facility];  Service: Urology;  Laterality: Left;  1 HR  . ESOPHAGOGASTRODUODENOSCOPY     many years ago per patient   . ESOPHAGOGASTRODUODENOSCOPY ENDOSCOPY  06/17/2018  . EYE SURGERY Bilateral    cataracts  . HOLMIUM LASER APPLICATION Right 74/02/8118  Procedure:  HOLMIUM LASER LITHOTRIPSY;  Surgeon: PCleon Gustin MD;  [facility]  Service: Urology;  Laterality: Right;  .  HOLMIUM LASER APPLICATION Left 2/45/8099   Procedure: HOLMIUM LASER APPLICATION;  Surgeon: Cleon Gustin, MD;  [facility];  Service: Urology;  Laterality: Left;  . IR THORACENTESIS ASP PLEURAL SPACE W/IMG GUIDE  10/03/2018  . IR THORACENTESIS ASP PLEURAL SPACE W/IMG GUIDE  06/08/2019  . LEFT HEART CATHETERIZATION WITH CORONARY/GRAFT ANGIOGRAM N/A 02/23/2013   Procedure: LEFT HEART CATHETERIZATION WITH Beatrix Fetters;  Surgeon: Blane Ohara, MD;  Location: [facility] C[facility];  Service: Cardiovascular;  Laterality: N/A;  . LOOP RECORDER INSERTION N/A 08/30/2017   Procedure: LOOP RECORDER INSERTION;  Surgeon: Evans Lance, MD;  Location: [facility] CV LAB;  Service: Cardiovascular;  Laterality: N/A;  . PORTACATH PLACEMENT Left 01/18/2016   Procedure: INSERTION PORT-A-CATH;  Surgeon: Alphonsa Overall, MD;  Location: [facility];  Service: General;  Laterality: Left;  . portacath removal    . REMOVAL OF PLEURAL DRAINAGE CATHETER Right 07/14/2019   Procedure: REMOVAL OF PLEURAL DRAINAGE CATHETER;  Surgeon: Gaye Pollack, MD;  Location: [facility];  Service: Thoracic;  Laterality: Right;  . TALC PLEURODESIS N/A 06/26/2019   Procedure: Pietro Cassis;  Surgeon: Gaye Pollack, MD;  [facility];  Service: Thoracic;  Laterality: N/A;  . TONSILLECTOMY    . TRANSTHORACIC ECHOCARDIOGRAM  02-24-2013      mild LVH,  ef 50-55%/  AV bioprosthesis was present with very mild stenosis and no regurg., mean grandient 59mHg, peak grandient  263mg /  mild MR/  mild LAE and RAE/  moderate TR  . TUBAL LIGATION      SOCIAL HISTORY: Social History   Socioeconomic History  . Marital status: Married    Spouse name: Not on file  . Number of children: 2  . Years of education: Not on file  . Highest education level: Not on file  Occupational History  . Occupation: retired  Tobacco Use  . Smoking status: Never Smoker  . Smokeless tobacco: Never Used  Vaping Use  . Vaping Use: Never used  Substance and Sexual Activity  . Alcohol use: No  . Drug use: No  . Sexual activity: Yes    Birth control/protection: None  Other Topics Concern  . Not on file  Social History Narrative  . Not on file   Social Determinants of Health   Financial Resource Strain:   . Difficulty of Paying Living Expenses:   Food Insecurity:   . Worried About RuCharity fundraisern the Last Year:   . RaArboriculturistn the Last Year:   Transportation Needs:   . LaFilm/video editorMedical):   . Marland Kitchenack of Transportation (Non-Medical):   Physical Activity:   . Days of Exercise per Week:   . Minutes of Exercise per Session:   Stress:   . Feeling of Stress :   Social Connections:   . Frequency of Communication with Friends and Family:   . Frequency of Social Gatherings with Friends and Family:   . Attends Religious Services:   . Active Member of Clubs or Organizations:   . Attends ClArchivisteetings:   . Marland Kitchenarital Status:   Intimate Partner Violence:   . Fear of Current or Ex-Partner:   . Emotionally Abused:   . Marland Kitchenhysically Abused:   . Sexually Abused:     FAMILY HISTORY: Family History  Problem Relation Age of Onset  . Heart disease Father   . Heart failure Father   . Diabetes Maternal Grandmother   . Heart disease Maternal  Grandmother   . Diabetes Son   . Healthy Brother        #1  . Heart attack Brother        #2  . Heart disease Brother        #2  . Colon cancer Neg Hx   . Esophageal cancer Neg Hx     Review of  Systems  Constitutional: Positive for fatigue. Negative for appetite change, chills, fever and unexpected weight change.  HENT:   Negative for hearing loss, lump/mass and mouth sores.   Eyes: Negative for eye problems and icterus.  Respiratory: Negative for chest tightness, cough and shortness of breath.   Cardiovascular: Negative for chest pain, leg swelling and palpitations.  Gastrointestinal: Negative for abdominal distention, abdominal pain, constipation, diarrhea and nausea.  Endocrine: Negative for hot flashes.  Genitourinary: Negative for difficulty urinating.   Musculoskeletal: Positive for back pain.  Neurological: Negative for dizziness, extremity weakness, headaches and numbness.  Hematological: Negative for adenopathy. Does not bruise/bleed easily.  Psychiatric/Behavioral: Negative for depression. The patient is not nervous/anxious.       PHYSICAL EXAMINATION  ECOG PERFORMANCE STATUS: 2 - Symptomatic, <50% confined to bed  Vitals:   01/22/20 0916  BP: (!) 94/45  Pulse: 72  Resp: 17  Temp: 98.2 F (36.8 C)  SpO2: 98%    Physical Exam Constitutional:      Appearance: Normal appearance.     Comments: In wheelchair with back brace on   HENT:     Head: Normocephalic and atraumatic.  Eyes:     General: No scleral icterus. Cardiovascular:     Rate and Rhythm: Normal rate and regular rhythm.     Heart sounds: Normal heart sounds.  Pulmonary:     Effort: Pulmonary effort is normal.     Breath sounds: Normal breath sounds.  Musculoskeletal:        General: No swelling.  Neurological:     General: No focal deficit present.     Mental Status: She is alert.  Psychiatric:        Mood and Affect: Mood normal.        Behavior: Behavior normal.     LABORATORY DATA:  CBC    Component Value Date/Time   WBC 7.0 01/22/2020 0855   WBC 6.7 10/30/2019 2059   RBC 3.34 (L) 01/22/2020 0856   RBC 3.36 (L) 01/22/2020 0855   HGB 10.3 (L) 01/22/2020 0855   HGB 11.4  06/02/2019 1436   HGB 8.5 (L) 04/27/2016 1352   HCT 32.9 (L) 01/22/2020 0855   HCT 36.2 06/02/2019 1436   HCT 27.3 (L) 04/27/2016 1352   PLT 108 (L) 01/22/2020 0855   PLT 154 06/02/2019 1436   MCV 97.9 01/22/2020 0855   MCV 93 06/02/2019 1436   MCV 97.5 04/27/2016 1352   MCH 30.7 01/22/2020 0855   MCHC 31.3 01/22/2020 0855   RDW 17.9 (H) 01/22/2020 0855   RDW 16.7 (H) 06/02/2019 1436   RDW 17.4 (H) 04/27/2016 1352   LYMPHSABS 0.5 (L) 01/22/2020 0855   LYMPHSABS 0.5 (L) 02/23/2019 1221   LYMPHSABS 1.0 04/27/2016 1352   MONOABS 0.7 01/22/2020 0855   MONOABS 0.9 04/27/2016 1352   EOSABS 0.4 01/22/2020 0855   EOSABS 0.2 02/23/2019 1221   BASOSABS 0.1 01/22/2020 0855   BASOSABS 0.0 02/23/2019 1221   BASOSABS 0.1 04/27/2016 1352    CMP     Component Value Date/Time   NA 136 01/22/2020 0855   NA 142  09/22/2019 1152   NA 143 04/27/2016 1352   K 4.5 01/22/2020 0855   K 4.1 04/27/2016 1352   CL 105 01/22/2020 0855   CO2 22 01/22/2020 0855   CO2 29 04/27/2016 1352   GLUCOSE 157 (H) 01/22/2020 0855   GLUCOSE 109 04/27/2016 1352   BUN 48 (H) 01/22/2020 0855   BUN 36 (H) 09/22/2019 1152   BUN 29.5 (H) 04/27/2016 1352   CREATININE 3.57 (HH) 01/22/2020 0855   CREATININE 1.66 (H) 05/23/2016 1143   CREATININE 1.3 (H) 04/27/2016 1352   CALCIUM 9.4 01/22/2020 0855   CALCIUM 8.9 04/27/2016 1352   PROT 6.1 (L) 01/22/2020 0855   PROT 5.9 (L) 08/03/2019 1624   PROT 6.0 (L) 04/27/2016 1352   ALBUMIN 2.6 (L) 01/22/2020 0855   ALBUMIN 3.2 (L) 08/03/2019 1624   ALBUMIN 2.7 (L) 04/27/2016 1352   AST 27 01/22/2020 0855   AST 28 04/27/2016 1352   ALT 26 01/22/2020 0855   ALT 24 04/27/2016 1352   ALKPHOS 71 01/22/2020 0855   ALKPHOS 62 04/27/2016 1352   BILITOT 1.1 01/22/2020 0855   BILITOT 0.81 04/27/2016 1352   GFRNONAA 12 (L) 01/22/2020 0855   GFRAA 14 (L) 01/22/2020 0855       PENDING LABS:   RADIOGRAPHIC STUDIES:  No results found.   PATHOLOGY:     ASSESSMENT  and THERAPY PLAN:   Breast cancer of upper-inner quadrant of right female breast ([place])  Patient has had longstanding anemia where the hemoglobin was between 10 to 12 g. Since April 2020 her hemoglobin has gotten worse During the same timeframe her creatinine got from 1.5-2.44   Current treatment:Retacritinjectionsweeklystarted7/31/2020; changed to Aranesp 537m q3 weeks 11/12/2019 Bone marrow biopsy 04/08/2019: Normocellular bone marrow with trilineage hematopoiesis, absent iron stores, flow cytometry no abnormal B or T-cell clonal abnormalities noted.   IV iron therapy:Venofer weekly x4 04/24/19-05/15/19     Venofer weekly x 5 12/18/2019-01/22/2020 ______________________________________________________________________________   Right breast cancer: IDC stage IA, ER positive, PR negative, HER-2 negative, s/p Taxotere/Cytoxan x 3 cycles, adjuvant radiation and Exemestane daily since 08/2016.   1. Anemia of chronic kidney disease: Receiving Aranesp every 3 weeks.  Due again on 02/05/2020.  Her hemoglobin is 10.2 today.  She last received a blood transfusion 2 weeks ago.  Her creatinine was elevated today and she notes she has not been drinking as much fluid as she should be.  We will give her some IV fluids today and recheck her labs in one week.  2. Iron deficiency: She will complete IV iron therapy today.  We will add iron studies to her standing labs to be completed every 6 weeks.  She notes she missed her colonoscopy with GI.  I recommended that she get this rescheduled since she did miss this appointment.  She is taking Eliquis, and it is quite possible that she has a small microscopic bleed from that therapy.    3. Breast cancer: She continues on Exemestane with good tolerance.  She will continue this.  She is due for mammogram and MRI to evaluate these skin changes on her breast.     4. Spinal Fractures: She will f/u with ortho about this next week.  I cannot find a recent bone density  test on her.  With her spinal fractures, this will be something important to assess.  I cannot locate a recent test on her.  She is going to look into where she had this completed.  In the meantime, we  discussed bone health including calcium, vitamin D and weight bearing exercises.    Laketta will return in one week for labs only and in 2 weeks for labs, f/u with Dr. Lindi Adie, and her next Aranesp.  She knows to call for any questions that may arise between now and her next appointment.  We are happy to see her sooner if needed.   Total encounter time: 30 minutes*  Wilber Bihari, NP 01/24/20 9:45 AM Medical Oncology and Hematology [facility] [place], [place] [address] Tel. [phone]    Fax. [phone]  *Total Encounter Time as defined by the Centers for Medicare and Medicaid Services includes, in addition to the face-to-face time of a patient visit (documented in the note above) non-face-to-face time: obtaining and reviewing outside history, ordering and reviewing medications, tests or procedures, care coordination (communications with other health care professionals or caregivers) and documentation in the medical record.

## 2020-01-24 NOTE — Assessment & Plan Note (Signed)
Patient has had longstanding anemia where the hemoglobin was between 10 to 12 g. Since April 2020 her hemoglobin has gotten worse During the same timeframe her creatinine got from 1.5-2.44   Current treatment:Retacritinjectionsweeklystarted7/31/2020; changed to Aranesp 552m q3 weeks 11/12/2019 Bone marrow biopsy 04/08/2019: Normocellular bone marrow with trilineage hematopoiesis, absent iron stores, flow cytometry no abnormal B or T-cell clonal abnormalities noted.   IV iron therapy:Venofer weekly x4 04/24/19-05/15/19     Venofer weekly x 5 12/18/2019-01/22/2020 ______________________________________________________________________________   Right breast cancer: IDC stage IA, ER positive, PR negative, HER-2 negative, s/p Taxotere/Cytoxan x 3 cycles, adjuvant radiation and Exemestane daily since 08/2016.   1. Anemia of chronic kidney disease: Receiving Aranesp every 3 weeks.  Due again on 02/05/2020.  Her hemoglobin is 10.2 today.  She last received a blood transfusion 2 weeks ago.  Her creatinine was elevated today and she notes she has not been drinking as much fluid as she should be.  We will give her some IV fluids today and recheck her labs in one week.  2. Iron deficiency: She will complete IV iron therapy today.  We will add iron studies to her standing labs to be completed every 6 weeks.  She notes she missed her colonoscopy with GI.  I recommended that she get this rescheduled since she did miss this appointment.  She is taking Eliquis, and it is quite possible that she has a small microscopic bleed from that therapy.    3. Breast cancer: She continues on Exemestane with good tolerance.  She will continue this.  She is due for mammogram and MRI to evaluate these skin changes on her breast.     4. Spinal Fractures: She will f/u with ortho about this next week.  I cannot find a recent bone density test on her.  With her spinal fractures, this will be something important to assess.  I  cannot locate a recent test on her.  She is going to look into where she had this completed.  In the meantime, we discussed bone health including calcium, vitamin D and weight bearing exercises.    SDeirawill return in one week for labs only and in 2 weeks for labs, f/u with Dr. GLindi Adie and her next Aranesp.  She knows to call for any questions that may arise between now and her next appointment.  We are happy to see her sooner if needed.

## 2020-01-25 ENCOUNTER — Telehealth: Payer: Self-pay | Admitting: Adult Health

## 2020-01-25 ENCOUNTER — Telehealth: Payer: Self-pay | Admitting: *Deleted

## 2020-01-25 NOTE — Telephone Encounter (Signed)
Scheduled appts per 8/13 los. Pt confirmed appt date and time.

## 2020-01-25 NOTE — Telephone Encounter (Signed)
Patient returned call

## 2020-01-25 NOTE — Telephone Encounter (Signed)
S/w pt per Truitt Merle, NP.  Pt stated Friday at Dr. Geralyn Flash office creatinine was not addressed.  Pt is advised to call Dr. Serita Grit office to [facility] pt's kidney function. Pt's wt is 140 lb as of yesterday and pt had last iron fusion.  On Thursday pt was dizzy and shaky, legs just gave out. After being stable no bp reading.  Pt will d/c imdur per [name], medication list updated. Pt is calling back. Pt has lab work with Dr. Posey Pronto this Wednesday and has ov appt with Dr. Posey Pronto Friday. Pt does not have to call [place] office since pt is seeing Doctor on Friday.

## 2020-01-27 ENCOUNTER — Other Ambulatory Visit: Payer: Self-pay

## 2020-01-27 ENCOUNTER — Encounter: Payer: Self-pay | Admitting: Orthopedic Surgery

## 2020-01-27 ENCOUNTER — Ambulatory Visit (INDEPENDENT_AMBULATORY_CARE_PROVIDER_SITE_OTHER): Payer: Medicare Other | Admitting: Orthopedic Surgery

## 2020-01-27 VITALS — Ht 64.0 in | Wt 140.0 lb

## 2020-01-27 DIAGNOSIS — S32010A Wedge compression fracture of first lumbar vertebra, initial encounter for closed fracture: Secondary | ICD-10-CM | POA: Insufficient documentation

## 2020-01-27 DIAGNOSIS — M545 Low back pain, unspecified: Secondary | ICD-10-CM

## 2020-01-27 MED ORDER — TRAMADOL HCL 50 MG PO TABS: 50.0000 mg | ORAL_TABLET | Freq: Four times a day (QID) | ORAL | 0 refills | Status: DC | PRN

## 2020-01-27 NOTE — Progress Notes (Signed)
Office Visit Note   Patient: Hannah [name]           Date of Birth: 09/04/1945           MRN: [id number] Visit Date: 01/27/2020              Requested by: [name], DO [place],  [place] [id number] PCP: [name], DO   Assessment & Plan: Visit Diagnoses:  1. Acute midline low back pain, unspecified whether sciatica present   2. Compression fracture of L1 lumbar vertebra, closed, initial encounter [facility])     Plan:  #1: At this time we are going to schedule her for an MRI scan to delineate the fracture pattern. #2: We will then have a consultation by Dr. Estanislado Pandy in radiology for possible vertebroplasty.  #3: Prescription for Robaxin was written for her.  She does not want any narcotics #4: If the above occurs then no further treatment by [place] will be indicated.   Follow-Up Instructions: Return if symptoms worsen or fail to improve.  Face-to-face time spent with patient was greater than 30 minutes.  Greater than 50% of the time was spent in counseling and coordination of care.  Orders:  Orders Placed This Encounter  Procedures  . MR Lumbar Spine w/o contrast  . Ambulatory referral to Interventional Radiology   Meds ordered this encounter  Medications  . traMADol (ULTRAM) 50 MG tablet    Sig: Take 1 tablet (50 mg total) by mouth every 6 (six) hours as needed.    Dispense:  30 tablet    Refill:  0    Order Specific Question:   Supervising Provider    Answer:   Garald Balding [[id number]]      Procedures: No procedures performed   Clinical Data: No additional findings.   Subjective: Chief Complaint  Patient presents with  . Lower Back - Pain   HPI Patient presents today for her lower back. She fell on 01/19/2020 and hit her head and back. She has pain in her lower back. She went to [facility] on [address] on 01/20/2020 and had x-rays taken. She was then told to follow up with an orthopedic. No new numbness or tingling in her lower  extremities. She already had neuropathy in her feet. She cannot walk long distances due to pain. Getting up from a seated position is very difficult. She is wearing a brace that was given to her at the [facility]. She was given Percocet to take, but has not had it in two days because it makes her very nauseated. She has been taking Tylenol. She has a difficult time sleeping in the bed, so she usually sleeps in a recliner.    Review of Systems  Constitutional: Negative for fatigue.  HENT: Negative for ear pain.   Eyes: Negative for pain.  Respiratory: Negative for shortness of breath.   Cardiovascular: Negative for leg swelling.  Gastrointestinal: Positive for constipation. Negative for diarrhea.  Endocrine: Negative for cold intolerance and heat intolerance.  Genitourinary: Negative for difficulty urinating.  Musculoskeletal: Negative for joint swelling.  Skin: Negative for rash.  Allergic/Immunologic: Negative for food allergies.  Neurological: Negative for weakness.  Hematological: Bruises/bleeds easily.  Psychiatric/Behavioral: Positive for sleep disturbance.     Objective: Vital Signs: Ht _0  (1.626 m)   Wt 140 lb (63.5 kg)   LMP  (LMP Unknown)   BMI 24.03 kg/m   Physical Exam Constitutional:  Appearance: Normal appearance. She is well-developed and normal weight.  HENT:     Head: Normocephalic.  Eyes:     Pupils: Pupils are equal, round, and reactive to light.  Pulmonary:     Effort: Pulmonary effort is normal.  Skin:    General: Skin is warm and dry.  Neurological:     Mental Status: She is alert and oriented to person, place, and time.  Psychiatric:        Behavior: Behavior normal.     Ortho Exam  Exam today reveals sensation intact to light touch along both lower extremities.  She has good strength in both lower extremities also.  Did not test the hips for strength but motion is not painful for her.  Negative straight leg raising bilaterally at 90 degrees.   Calf is supple and nontender.  Tender to palpation over the lumbar spine.  Specialty Comments:  No specialty comments available.  Imaging: CLINICAL DATA:  Fall  EXAM: LUMBAR SPINE - COMPLETE 4+ VIEW  COMPARISON:  CT abdomen/pelvis dated 10/30/2019  FINDINGS: Five lumbar-type vertebral bodies.  Normal lumbar lordosis.  Mild superior endplate compression fracture deformity at L1, age indeterminate, but new from prior CT. No retropulsion.  Mild superior/inferior endplate changes at L3, corresponding to Schmorl's node changes on prior CT, chronic.  Mild to moderate degenerative changes of the lower lumbar spine.  Visualized bony pelvis appears intact.  IMPRESSION: Mild superior endplate compression fracture deformity at L1, age indeterminate but new from prior CT. No retropulsion. Correlate for point tenderness.  Mild to moderate degenerative changes of the lower lumbar spine.   Electronically Signed   By: Julian Hy M.D.   On: 01/20/2020 11:06    PMFS History: Current Outpatient Medications  Medication Sig Dispense Refill  . ACCU-CHEK GUIDE test strip Use to test blood sugar 3 times daily 150 strip 3  . acetaminophen (TYLENOL) 325 MG tablet Take 2 tablets (650 mg total) by mouth every 6 (six) hours as needed for mild pain (or Fever >/= 101).    Marland Kitchen albuterol (VENTOLIN HFA) 108 (90 Base) MCG/ACT inhaler Inhale 2 puffs into the lungs every 6 (six) hours as needed for wheezing or shortness of breath. 18 g 5  . amiodarone (PACERONE) 200 MG tablet Take 1 tablet (200 mg total) by mouth daily. 90 tablet 3  . Blood Glucose Monitoring Suppl (ACCU-CHEK GUIDE ME) w/Device KIT Use accu chek meter to check blood sugar three times daily. 1 kit 0  . budesonide-formoterol (SYMBICORT) 160-4.5 MCG/ACT inhaler Inhale 2 puffs into the lungs 2 (two) times daily. 1 Inhaler 5  . Dulaglutide (TRULICITY) 1.5 MW/1.0UV SOPN Inject 1.5 mg into the skin once a week. 6 mL 1  .  ELIQUIS 5 MG TABS tablet Take 1 tablet (5 mg total) by mouth 2 (two) times daily. 180 tablet 1  . exemestane (AROMASIN) 25 MG tablet TAKE ONE TABLET BY MOUTH DAILY AFTER BREAKFAST 90 tablet 3  . ezetimibe (ZETIA) 10 MG tablet TAKE ONE TABLET BY MOUTH EVERY DAY 90 tablet 3  . famotidine (PEPCID) 20 MG tablet Take 20 mg by mouth 2 (two) times daily.    . furosemide (LASIX) 20 MG tablet Take 20 mg by mouth daily as needed.    . insulin glargine (LANTUS) 100 UNIT/ML injection Inject 18 Units into the skin daily.    . Insulin Lispro (HUMALOG KWIKPEN [place]) Inject 6-8 Units into the skin 3 (three) times daily.     . Insulin Pen  Needle (BD PEN NEEDLE NANO U/F) 32G X 4 MM MISC USE AS INSTRUCTED TO INJECT INSULIN 4 TIMES DAILY. 360 each 1  . loratadine (CLARITIN) 10 MG tablet Take 10 mg by mouth daily.     . metoprolol tartrate (LOPRESSOR) 25 MG tablet Take 1 tablet (25 mg total) by mouth 2 (two) times daily. Extra dose if needed for palpitations. 180 tablet 3  . midodrine (PROAMATINE) 2.5 MG tablet Take 2.5 mg by mouth 3 (three) times daily with meals.    . nitroGLYCERIN (NITROSTAT) 0.4 MG SL tablet Place 1 tablet (0.4 mg total) under the tongue every 5 (five) minutes as needed for chest pain. 30 tablet 3  . ondansetron (ZOFRAN ODT) 4 MG disintegrating tablet Take 1 tablet (4 mg total) by mouth every 8 (eight) hours as needed. 10 tablet 0  . oxyCODONE-acetaminophen (PERCOCET/ROXICET) 5-325 MG tablet Take 1 tablet by mouth every 4 (four) hours as needed for severe pain. 15 tablet 0  . rosuvastatin (CRESTOR) 40 MG tablet Take 1 tablet (40 mg total) by mouth at bedtime. 90 tablet 3  . zolpidem (AMBIEN) 5 MG tablet Take 5 mg by mouth at bedtime.     . traMADol (ULTRAM) 50 MG tablet Take 1 tablet (50 mg total) by mouth every 6 (six) hours as needed. 30 tablet 0   No current facility-administered medications for this visit.    Patient Active Problem List   Diagnosis Date Noted  . Compression fracture of L1  lumbar vertebra, closed, initial encounter ([place]) 01/27/2020  . Pain in left shoulder 03/26/2019  . History of breast cancer in female 03/26/2019  . Hypokalemia 02/20/2019  . Acute exacerbation of CHF (congestive heart failure) ([place]) 02/19/2019  . Pleural effusion on right 09/25/2018  . Thrombocytopenia ([place]) 09/25/2018  . Cholecystitis with cholelithiasis 07/07/2018  . Prolonged Q-T interval on ECG 05/21/2018  . Atrial fibrillation ([place]) 01/23/2018  . Closed fracture of head of left humerus 09/09/2017  . Syncope 09/06/2017  . Seizure-like activity ([place]) 09/01/2017  . AKI (acute kidney injury) ([place]) 09/01/2017  . Adrenal insufficiency ([place]) 07/03/2016  . Seizure ([place]) 06/29/2016  . Chemotherapy-induced peripheral neuropathy ([place]) 04/27/2016  . 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 Osteomyelitis of toe of left foot ([place]) 04/06/2016  . Mania ([place]) 03/05/2016  . GERD (gastroesophageal reflux disease) 02/29/2016  . Chemotherapy induced nausea and vomiting 02/24/2016  . Port catheter in place 02/17/2016  . Sensorineural hearing loss (SNHL) of both ears 01/06/2016  . Breast cancer of upper-inner quadrant of right female breast ([place]) 12/16/2015  . Osteopenia of multiple sites 10/19/2015  . CKD (chronic kidney disease), stage IV ([place]) 09/24/2013  . Chronic diastolic CHF (congestive heart failure) ([place]) 09/24/2013  . Acute on chronic combined systolic and diastolic congestive heart failure ([place]) 07/17/2013  . Atrial tachycardia ([place]) 04/03/2013  . CAD (coronary artery disease) 01/24/2011  . Chronic coronary artery disease 01/24/2011  . History of aortic valve replacement 10/27/2010  . Hyperlipidemia   . DOE (dyspnea on exertion)   . Nonischemic cardiomyopathy ([place])   . Anemia of chronic disease   . Type 2 diabetes mellitus ([place])   . Iron deficiency anemia   . Allergic rhinitis 10/14/2009  . Essential hypertension 05/12/2009  . H/O atrial tachycardia 05/12/2009  . Asthma  05/12/2009   Past Medical History:  Diagnosis Date  . Abnormally small mouth   . Allergic rhinitis 10/14/2009   Qualifier: Diagnosis of  By: Lamonte Sakai MD, Rose Fillers   Overview:  Overview:  Qualifier: Diagnosis of  By: Lamonte Sakai MD, Rose Fillers  Last Assessment & Plan:  Please continue Xyzal and Nasacort as you have been using them  . Anemia   . Asthma 05/12/2009   10/12/2014 p extensive coaching HFA effectiveness =    75% s spacer    Overview:  Overview:  10/12/2014 p extensive coaching HFA effectiveness =    75% s spacer   Last Assessment & Plan:  Please continue Symbicort 2 puffs twice a day. Remember to rinse and gargle after taking this medication. Take albuterol 2 puffs up to every 4 hours if needed for shortness of breath.  Follow with Dr Lamonte Sakai in 6 month  . Bilateral carotid artery stenosis    a. mild 1-39% by duplex 10/2017, due 2021.  . Breast cancer ([place]) 01/18/2016   right breast  . CAD (coronary artery disease) 01/24/2011   a. s/p CABG 1998 with redo 2009.  Marland Kitchen Chemotherapy induced nausea and vomiting 02/24/2016  . Chemotherapy-induced peripheral neuropathy ([place]) 04/27/2016  . 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 Chronic diastolic CHF (congestive heart failure) ([place]) 09/24/2013  . CKD (chronic kidney disease), stage IV ([place]) 09/24/2013  . Closed fracture of head of left humerus 09/09/2017  . Complication of anesthesia   . Dysrhythmia    a-fib  . Gallstones   . GERD (gastroesophageal reflux disease)   . Glaucoma   . H/O atrial tachycardia 05/12/2009   Qualifier: History of  By: Lamonte Sakai MD, Rose Fillers   Overview:  Overview:  Qualifier: History of  By: Lamonte Sakai MD, Rose Fillers  Last Assessment & Plan:  There was no evidence of this on her cardiac monitor. I would recommend watchful waiting.   Marland Kitchen Heart murmur   . History of kidney stones   . History of non-ST elevation myocardial infarction (NSTEMI)    Sept 2014--  thought to be type II HTN w/ LHC without infarct related artery and patent grafts  .  History of radiation therapy 05/24/16-07/26/16   right breast 50.4 Gy in 28 fractions, right breast boost 10 Gy in 5 fractions  . Hyperlipidemia   . Hypotension   . Iron deficiency anemia   . Mania ([place]) 03/05/2016  . Moderate persistent asthma    pulmologist-  Dr. Malvin Johns  . Osteomyelitis of toe of left foot ([place]) 04/06/2016  . Osteopenia of multiple sites 10/19/2015  . PAF (paroxysmal atrial fibrillation) ([place])   . Personal history of chemotherapy 2017  . Personal history of radiation therapy 2017  . PONV (postoperative nausea and vomiting)   . Port catheter in place 02/17/2016  . Psoriasis    right leg  . Renal calculus, right   . S/P AVR    prosthesis valve placement 2009 at same time re-do CABG  . Seizure-like activity ([place]) 09/01/2017  . Sensorineural hearing loss (SNHL) of both ears 01/06/2016  . Stroke [facility]) 2014   residual rt hearing loss  . Syncope 09/06/2017  . Type 2 diabetes mellitus ([place])    monitored by dr Dwyane Dee    Family History  Problem Relation Age of Onset  . Heart disease Father   . Heart failure Father   . Diabetes Maternal Grandmother   . Heart disease Maternal Grandmother   . Diabetes Son   . Healthy Brother        #1  . Heart attack Brother        #2  . Heart disease Brother        #2  .  Colon cancer Neg Hx   . Esophageal cancer Neg Hx     Past Surgical History:  Procedure Laterality Date  . AORTIC VALVE REPLACEMENT  2009   [facility] (done same time is CABG)  . BREAST LUMPECTOMY Right 01/18/2016  . BREAST LUMPECTOMY WITH RADIOACTIVE SEED AND SENTINEL LYMPH NODE BIOPSY Right 01/18/2016   Procedure: RIGHT BREAST LUMPECTOMY WITH RADIOACTIVE SEED AND SENTINEL LYMPH NODE BIOPSY;  Surgeon: DAlphonsa Overall MD;  [facility]  Service: General;  Laterality: Right;  . CARDIAC CATHETERIZATION  03/23/2008   Pre-redo CABG: L main OK, LAD (T), CFX (T), OM1 99%, RCA (T), LIMA-LAD OK, SVG-OM(?3) OK w/ little florw to OM2, SVG-RCA OK.  EF NL  . CARPAL TUNNEL RELEASE    . CHEST TUBE INSERTION Right 06/26/2019   Procedure: INSERTION PLEURAL DRAINAGE CATHETER;  Surgeon: BGaye Pollack MD;  [facility]  Service: Thoracic;  Laterality: Right;  . CHOLECYSTECTOMY N/A 07/07/2018   Procedure: LAPAROSCOPIC CHOLECYSTECTOMY WITH INTRAOPERATIVE CHOLANGIOGRAM ERAS PATHWAY;  Surgeon: NAlphonsa Overall MD;  Location: [facility];  Service: General;  Laterality: N/A;  . COLONOSCOPY     around 2015. Possibly with [facility]   . CORONARY ARTERY BYPASS GRAFT  1998 &  re-do 2009   Had LIMA to DX/LAD, SVG to 2 marginal branches and [facility]; SVG to 3rd OM and PD at time of redo  . CYSTOSCOPY W/ URETERAL STENT PLACEMENT Right 12/20/2014   Procedure: CYSTOSCOPY WITH RETROGRADE PYELOGRAM/URETERAL STENT PLACEMENT;  Surgeon: PCleon Gustin MD;  [facility]  Service: Urology;  Laterality: Right;  . CYSTOSCOPY WITH RETROGRADE PYELOGRAM, URETEROSCOPY AND STENT PLACEMENT Left 08/21/2019   Procedure: CYSTOSCOPY WITH RETROGRADE PYELOGRAM, URETEROSCOPY AND STENT PLACEMENT;  Surgeon: MCleon Gustin MD;  [facility];  Service: Urology;  Laterality: Left;  1 HR  . ESOPHAGOGASTRODUODENOSCOPY     many years ago per patient   . ESOPHAGOGASTRODUODENOSCOPY ENDOSCOPY  06/17/2018  . EYE SURGERY Bilateral    cataracts  . HOLMIUM LASER APPLICATION Right 74/08/7094  Procedure:  HOLMIUM LASER LITHOTRIPSY;  Surgeon: PCleon Gustin MD;  [facility]  Service: Urology;  Laterality: Right;  . HOLMIUM LASER APPLICATION Left 34/38/3818  Procedure: HOLMIUM LASER APPLICATION;  Surgeon: MCleon Gustin MD;  [facility];  Service: Urology;  Laterality: Left;  . IR THORACENTESIS ASP PLEURAL SPACE W/IMG GUIDE  10/03/2018  . IR THORACENTESIS ASP PLEURAL SPACE W/IMG GUIDE  06/08/2019  . LEFT HEART CATHETERIZATION WITH CORONARY/GRAFT ANGIOGRAM N/A 02/23/2013   Procedure: LEFT HEART CATHETERIZATION WITH  CBeatrix Fetters  Surgeon: MBlane Ohara MD;  [facility];  Service: Cardiovascular;  Laterality: N/A;  . LOOP RECORDER INSERTION N/A 08/30/2017   Procedure: LOOP RECORDER INSERTION;  Surgeon: TEvans Lance MD;  [facility];  Service: Cardiovascular;  Laterality: N/A;  . PORTACATH PLACEMENT Left 01/18/2016   Procedure: INSERTION PORT-A-CATH;  Surgeon: DAlphonsa Overall MD;  [facility]  Service: General;  Laterality: Left;  . portacath removal    . REMOVAL OF PLEURAL DRAINAGE CATHETER Right 07/14/2019   Procedure: REMOVAL OF PLEURAL DRAINAGE CATHETER;  Surgeon: BGaye Pollack MD;  [facility]  Service: Thoracic;  Laterality: Right;  . TALC PLEURODESIS N/A 06/26/2019   Procedure: TPietro Cassis  Surgeon: BGaye Pollack MD;  [facility];  Service: Thoracic;  Laterality: N/A;  . TONSILLECTOMY    .  TRANSTHORACIC ECHOCARDIOGRAM  02-24-2013      mild LVH,  ef 50-55%/  AV bioprosthesis was present with very mild stenosis and no regurg., mean grandient 65mHg, peak grandient 233mg /  mild MR/  mild LAE and RAE/  moderate TR  . TUBAL LIGATION     Social History   Occupational History  . Occupation: retired  Tobacco Use  . Smoking status: Never Smoker  . Smokeless tobacco: Never Used  Vaping Use  . Vaping Use: Never used  Substance and Sexual Activity  . Alcohol use: No  . Drug use: No  . Sexual activity: Yes    Birth control/protection: None

## 2020-01-28 ENCOUNTER — Other Ambulatory Visit (HOSPITAL_COMMUNITY): Payer: Medicare Other

## 2020-01-28 ENCOUNTER — Other Ambulatory Visit: Payer: Self-pay | Admitting: Nephrology

## 2020-01-28 ENCOUNTER — Ambulatory Visit (HOSPITAL_COMMUNITY)
Admission: RE | Admit: 2020-01-28 | Discharge: 2020-01-28 | Disposition: A | Discharge status: Home / Self Care | Payer: Medicare Other | Source: Ambulatory Visit | Attending: Orthopedic Surgery | Admitting: Orthopedic Surgery

## 2020-01-28 ENCOUNTER — Telehealth: Payer: Self-pay

## 2020-01-28 ENCOUNTER — Other Ambulatory Visit (HOSPITAL_COMMUNITY): Payer: Self-pay | Admitting: Nephrology

## 2020-01-28 DIAGNOSIS — I6523 Occlusion and stenosis of bilateral carotid arteries: Secondary | ICD-10-CM | POA: Insufficient documentation

## 2020-01-28 DIAGNOSIS — N184 Chronic kidney disease, stage 4 (severe): Secondary | ICD-10-CM

## 2020-01-28 DIAGNOSIS — M545 Low back pain, unspecified: Secondary | ICD-10-CM

## 2020-01-28 NOTE — Telephone Encounter (Signed)
Pt called asking if she would need labs drawn tomorrow since she saw Dr Posey Pronto; nephrologist yesterday and had labs drawn. Contacted Dr Serita Grit office to request fax of labs. Labs received and appt at [facility] cancelled per Dr Lindi Adie OK. Pt will f/u as scheduled with Wilber Bihari, NP

## 2020-01-29 ENCOUNTER — Ambulatory Visit (HOSPITAL_BASED_OUTPATIENT_CLINIC_OR_DEPARTMENT_OTHER)
Admission: RE | Admit: 2020-01-29 | Discharge: 2020-01-29 | Disposition: A | Discharge status: Home / Self Care | Payer: Medicare Other | Source: Ambulatory Visit | Attending: Nurse Practitioner | Admitting: Nurse Practitioner

## 2020-01-29 ENCOUNTER — Ambulatory Visit (HOSPITAL_COMMUNITY)
Admission: RE | Admit: 2020-01-29 | Discharge: 2020-01-29 | Disposition: A | Discharge status: Home / Self Care | Payer: Medicare Other | Source: Ambulatory Visit | Attending: Nephrology | Admitting: Nephrology

## 2020-01-29 ENCOUNTER — Inpatient Hospital Stay: Payer: Medicare Other

## 2020-01-29 ENCOUNTER — Other Ambulatory Visit: Payer: Self-pay

## 2020-01-29 DIAGNOSIS — I6523 Occlusion and stenosis of bilateral carotid arteries: Secondary | ICD-10-CM

## 2020-01-29 DIAGNOSIS — N184 Chronic kidney disease, stage 4 (severe): Secondary | ICD-10-CM | POA: Diagnosis not present

## 2020-01-29 DIAGNOSIS — M545 Low back pain: Secondary | ICD-10-CM | POA: Diagnosis not present

## 2020-02-02 ENCOUNTER — Encounter: Payer: Self-pay | Admitting: Orthopaedic Surgery

## 2020-02-02 ENCOUNTER — Ambulatory Visit (INDEPENDENT_AMBULATORY_CARE_PROVIDER_SITE_OTHER): Payer: Medicare Other | Admitting: Orthopaedic Surgery

## 2020-02-02 ENCOUNTER — Other Ambulatory Visit: Payer: Self-pay | Admitting: Internal Medicine

## 2020-02-02 VITALS — Ht 64.0 in | Wt 140.0 lb

## 2020-02-02 DIAGNOSIS — S32010A Wedge compression fracture of first lumbar vertebra, initial encounter for closed fracture: Secondary | ICD-10-CM

## 2020-02-02 NOTE — Progress Notes (Signed)
Office Visit Note   Patient: Hannah [name]           Date of Birth: 17-Apr-1946           MRN: [id number] Visit Date: 02/02/2020              Requested by: [name], DO [place] [address],  [place] [address] PCP: [name], DO   Assessment & Plan: Visit Diagnoses:  1. Compression fracture of L1 lumbar vertebra, closed, initial encounter [facility])     Plan: Mrs. Zappia is accompanied by her husband and here for follow-up evaluation of her L1 compression fracture.  The interventional radiologist would not proceed with a kyphoplasty until after she had an MRI scan.  This was performed 5 days ago demonstrating an acute or subacute compression fracture of L1 with loss of about 25% of the height.  No retropulsed bone.  There was nothing to suggest that this was anything other than a benign fracture.  In addition there was a broad-based disc herniation at L2-3 with moderate stenosis that could possibly cause neural compression.  There is also mild multifactorial stenosis at L3-4 with the potential for neural compression and bilateral lateral recess stenosis and foraminal stenosis on the right at L4-5.  Long discussion with Mr. Mrs. Larsson regarding all of the above.  No further questions.  Will recontact Dr. Estanislado Pandy at [facility] for a kyphoplasty.  I suspect that there is some other underlying pathology that may need to be addressed in the future I.  E.  The spinal stenosis.  She might be a good candidate for an epidural steroid injection discussed that as well  Follow-Up Instructions: Return After kyphoplasty.   Orders:  No orders of the defined types were placed in this encounter.  No orders of the defined types were placed in this encounter.     Procedures: No procedures performed   Clinical Data: No additional findings.   Subjective: Chief Complaint  Patient presents with  . Follow-up    Review scan  Patient presents today for follow up on her lower back.  She saw Aaron Edelman last week in clinic for her back. She had an MRI scan on 01/28/2020 and has been referred to [facility] for a compression fracture kyphoplasty has been wearing a back brace.  With further history she has had some chronic problems with her back and possibly claudication related to her lumbar spine with pain when she stands for a length of time or when she walks for any distance in her back and her buttock and thighs  HPI  Review of Systems   Objective: Vital Signs: Ht 5\' 4"  (1.626 m)   Wt 140 lb (63.5 kg)   LMP  (LMP Unknown)   BMI 24.03 kg/m   Physical Exam Constitutional:      Appearance: She is well-developed.  Eyes:     Pupils: Pupils are equal, round, and reactive to light.  Pulmonary:     Effort: Pulmonary effort is normal.  Skin:    General: Skin is warm and dry.  Neurological:     Mental Status: She is alert and oriented to person, place, and time.  Psychiatric:        Behavior: Behavior normal.     Ortho Exam awake alert and oriented x3.  Comfortable sitting in her wheelchair.  Does wear a back brace for the compression fracture.  Does have some percussible tenderness in the upper lumbar region.  Straight leg raise negative.  Painless range of motion of both hips and knees.  Specialty Comments:  No specialty comments available.  Imaging: No results found.   PMFS History: Patient Active Problem List   Diagnosis Date Noted  . Compression fracture of L1 lumbar vertebra, closed, initial encounter ([place]) 01/27/2020  . Pain in left shoulder 03/26/2019  . History of breast cancer in female 03/26/2019  . Hypokalemia 02/20/2019  . Acute exacerbation of CHF (congestive heart failure) ([place]) 02/19/2019  . Pleural effusion on right 09/25/2018  . Thrombocytopenia ([place]) 09/25/2018  . Cholecystitis with cholelithiasis 07/07/2018  . Prolonged Q-T interval on ECG 05/21/2018  . Atrial fibrillation ([place]) 01/23/2018  . Closed fracture of head of left humerus  09/09/2017  . Syncope 09/06/2017  . Seizure-like activity ([place]) 09/01/2017  . AKI (acute kidney injury) ([place]) 09/01/2017  . Adrenal insufficiency ([place]) 07/03/2016  . Seizure ([place]) 06/29/2016  . Chemotherapy-induced peripheral neuropathy ([place]) 04/27/2016  . 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 Osteomyelitis of toe of left foot ([place]) 04/06/2016  . Mania ([place]) 03/05/2016  . GERD (gastroesophageal reflux disease) 02/29/2016  . Chemotherapy induced nausea and vomiting 02/24/2016  . Port catheter in place 02/17/2016  . Sensorineural hearing loss (SNHL) of both ears 01/06/2016  . Breast cancer of upper-inner quadrant of right female breast ([place]) 12/16/2015  . Osteopenia of multiple sites 10/19/2015  . CKD (chronic kidney disease), stage IV ([place]) 09/24/2013  . Chronic diastolic CHF (congestive heart failure) ([place]) 09/24/2013  . Acute on chronic combined systolic and diastolic congestive heart failure ([place]) 07/17/2013  . Atrial tachycardia ([place]) 04/03/2013  . CAD (coronary artery disease) 01/24/2011  . Chronic coronary artery disease 01/24/2011  . History of aortic valve replacement 10/27/2010  . Hyperlipidemia   . DOE (dyspnea on exertion)   . Nonischemic cardiomyopathy ([place])   . Anemia of chronic disease   . Type 2 diabetes mellitus ([place])   . Iron deficiency anemia   . Allergic rhinitis 10/14/2009  . Essential hypertension 05/12/2009  . H/O atrial tachycardia 05/12/2009  . Asthma 05/12/2009   Past Medical History:  Diagnosis Date  . Abnormally small mouth   . Allergic rhinitis 10/14/2009   Qualifier: Diagnosis of  By: Lamonte Sakai MD, Rose Fillers   Overview:  Overview:  Qualifier: Diagnosis of  By: Lamonte Sakai MD, Rose Fillers  Last Assessment & Plan:  Please continue Xyzal and Nasacort as you have been using them  . Anemia   . Asthma 05/12/2009   10/12/2014 p extensive coaching HFA effectiveness =    75% s spacer    Overview:  Overview:  10/12/2014 p extensive coaching HFA effectiveness =     75% s spacer   Last Assessment & Plan:  Please continue Symbicort 2 puffs twice a day. Remember to rinse and gargle after taking this medication. Take albuterol 2 puffs up to every 4 hours if needed for shortness of breath.  Follow with Dr Lamonte Sakai in 6 month  . Bilateral carotid artery stenosis    a. mild 1-39% by duplex 10/2017, due 2021.  . Breast cancer ([place]) 01/18/2016   right breast  . CAD (coronary artery disease) 01/24/2011   a. s/p CABG 1998 with redo 2009.  Marland Kitchen Chemotherapy induced nausea and vomiting 02/24/2016  . Chemotherapy-induced peripheral neuropathy ([place]) 04/27/2016  . 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 Chronic diastolic CHF (congestive heart failure) ([place]) 09/24/2013  . CKD (chronic kidney disease), stage IV ([place]) 09/24/2013  . Closed fracture of head of left humerus  09/09/2017  . Complication of anesthesia   . Dysrhythmia    a-fib  . Gallstones   . GERD (gastroesophageal reflux disease)   . Glaucoma   . H/O atrial tachycardia 05/12/2009   Qualifier: History of  By: Lamonte Sakai MD, Rose Fillers   Overview:  Overview:  Qualifier: History of  By: Lamonte Sakai MD, Rose Fillers  Last Assessment & Plan:  There was no evidence of this on her cardiac monitor. I would recommend watchful waiting.   Marland Kitchen Heart murmur   . History of kidney stones   . History of non-ST elevation myocardial infarction (NSTEMI)    Sept 2014--  thought to be type II HTN w/ LHC without infarct related artery and patent grafts  . History of radiation therapy 05/24/16-07/26/16   right breast 50.4 Gy in 28 fractions, right breast boost 10 Gy in 5 fractions  . Hyperlipidemia   . Hypotension   . Iron deficiency anemia   . Mania ([place]) 03/05/2016  . Moderate persistent asthma    pulmologist-  Dr. Malvin Johns  . Osteomyelitis of toe of left foot ([place]) 04/06/2016  . Osteopenia of multiple sites 10/19/2015  . PAF (paroxysmal atrial fibrillation) ([place])   . Personal history of chemotherapy 2017  . Personal history of radiation  therapy 2017  . PONV (postoperative nausea and vomiting)   . Port catheter in place 02/17/2016  . Psoriasis    right leg  . Renal calculus, right   . S/P AVR    prosthesis valve placement 2009 at same time re-do CABG  . Seizure-like activity ([place]) 09/01/2017  . Sensorineural hearing loss (SNHL) of both ears 01/06/2016  . Stroke [facility]) 2014   residual rt hearing loss  . Syncope 09/06/2017  . Type 2 diabetes mellitus ([place])    monitored by dr Dwyane Dee    Family History  Problem Relation Age of Onset  . Heart disease Father   . Heart failure Father   . Diabetes Maternal Grandmother   . Heart disease Maternal Grandmother   . Diabetes Son   . Healthy Brother        #1  . Heart attack Brother        #2  . Heart disease Brother        #2  . Colon cancer Neg Hx   . Esophageal cancer Neg Hx     Past Surgical History:  Procedure Laterality Date  . AORTIC VALVE REPLACEMENT  2009   #23mm [facility] Ease pericardial valve (done same time is CABG)  . BREAST LUMPECTOMY Right 01/18/2016  . BREAST LUMPECTOMY WITH RADIOACTIVE SEED AND SENTINEL LYMPH NODE BIOPSY Right 01/18/2016   Procedure: RIGHT BREAST LUMPECTOMY WITH RADIOACTIVE SEED AND SENTINEL LYMPH NODE BIOPSY;  Surgeon: Alphonsa Overall, MD;  Location: [facility];  Service: General;  Laterality: Right;  . CARDIAC CATHETERIZATION  03/23/2008   Pre-redo CABG: L main OK, LAD (T), CFX (T), OM1 99%, RCA (T), LIMA-LAD OK, SVG-OM(?3) OK w/ little florw to OM2, SVG-RCA OK. EF NL  . CARPAL TUNNEL RELEASE    . CHEST TUBE INSERTION Right 06/26/2019   Procedure: INSERTION PLEURAL DRAINAGE CATHETER;  Surgeon: Gaye Pollack, MD;  Location: [facility];  Service: Thoracic;  Laterality: Right;  . CHOLECYSTECTOMY N/A 07/07/2018   Procedure: LAPAROSCOPIC CHOLECYSTECTOMY WITH INTRAOPERATIVE CHOLANGIOGRAM ERAS PATHWAY;  Surgeon: Alphonsa Overall, MD;  Location: [facility];  Service: General;  Laterality: N/A;  . COLONOSCOPY     around 2015. Possibly with [facility]   . CORONARY  ARTERY  BYPASS GRAFT  1998 &  re-do 2009   Had LIMA to DX/LAD, SVG to 2 marginal branches and SVG to [facility] originally; SVG to 3rd OM and PD at time of redo  . CYSTOSCOPY W/ URETERAL STENT PLACEMENT Right 12/20/2014   Procedure: CYSTOSCOPY WITH RETROGRADE PYELOGRAM/URETERAL STENT PLACEMENT;  Surgeon: Cleon Gustin, MD;  Location[facility];  Service: Urology;  Laterality: Right;  . CYSTOSCOPY WITH RETROGRADE PYELOGRAM, URETEROSCOPY AND STENT PLACEMENT Left 08/21/2019   Procedure: CYSTOSCOPY WITH RETROGRADE PYELOGRAM, URETEROSCOPY AND STENT PLACEMENT;  Surgeon: Cleon Gustin, MD;  Location[facility];  Service: Urology;  Laterality: Left;  1 HR  . ESOPHAGOGASTRODUODENOSCOPY     many years ago per patient   . ESOPHAGOGASTRODUODENOSCOPY ENDOSCOPY  06/17/2018  . EYE SURGERY Bilateral    cataracts  . HOLMIUM LASER APPLICATION Right 1/60/7371   Procedure:  HOLMIUM LASER LITHOTRIPSY;  Surgeon: Cleon Gustin, MD;  Location: [facility];  Service: Urology;  Laterality: Right;  . HOLMIUM LASER APPLICATION Left 0/62/6948   Procedure: HOLMIUM LASER APPLICATION;  Surgeon: Cleon Gustin, MD;  [facility];  Service: Urology;  Laterality: Left;  . IR THORACENTESIS ASP PLEURAL SPACE W/IMG GUIDE  10/03/2018  . IR THORACENTESIS ASP PLEURAL SPACE W/IMG GUIDE  06/08/2019  . LEFT HEART CATHETERIZATION WITH CORONARY/GRAFT ANGIOGRAM N/A 02/23/2013   Procedure: LEFT HEART CATHETERIZATION WITH Beatrix Fetters;  Surgeon: Blane Ohara, MD;  Location: [facility] CATH LAB;  Service: Cardiovascular;  Laterality: N/A;  . LOOP RECORDER INSERTION N/A 08/30/2017   Procedure: LOOP RECORDER INSERTION;  Surgeon: Evans Lance, MD;  Location: [facility];  Service: Cardiovascular;  Laterality: N/A;  . PORTACATH PLACEMENT Left 01/18/2016   Procedure: INSERTION PORT-A-CATH;  Surgeon: Alphonsa Overall, MD;  Location: [facility];  Service: General;  Laterality: Left;  . portacath removal    .  REMOVAL OF PLEURAL DRAINAGE CATHETER Right 07/14/2019   Procedure: REMOVAL OF PLEURAL DRAINAGE CATHETER;  Surgeon: Gaye Pollack, MD;  Location: [facility];  Service: Thoracic;  Laterality: Right;  . TALC PLEURODESIS N/A 06/26/2019   Procedure: Pietro Cassis;  Surgeon: Gaye Pollack, MD;  Location: [facility];  Service: Thoracic;  Laterality: N/A;  . TONSILLECTOMY    . TRANSTHORACIC ECHOCARDIOGRAM  02-24-2013      mild LVH,  ef 50-55%/  AV bioprosthesis was present with very mild stenosis and no regurg., mean grandient 36mmHg, peak grandient 44mmHg /  mild MR/  mild LAE and RAE/  moderate TR  . TUBAL LIGATION     Social History   Occupational History  . Occupation: retired  Tobacco Use  . Smoking status: Never Smoker  . Smokeless tobacco: Never Used  Vaping Use  . Vaping Use: Never used  Substance and Sexual Activity  . Alcohol use: No  . Drug use: No  . Sexual activity: Yes    Birth control/protection: None

## 2020-02-03 NOTE — Progress Notes (Signed)
Patient Care Team: [name], DO as PCP - General [name], NP as PCP - Cardiology (Nurse Practitioner) Evans Lance, MD as PCP - Electrophysiology (Cardiology) Alphonsa Overall, MD as Consulting Physician (General Surgery) Nicholas Lose, MD as Consulting Physician (Hematology and Oncology) Gery Pray, MD as Consulting Physician (Radiation Oncology) Evans Lance, MD as Consulting Physician (Cardiology) Collene Gobble, MD as Consulting Physician (Pulmonary Disease) Elayne Snare, MD as Consulting Physician (Endocrinology) Delice Bison, Charlestine Massed, NP as Nurse Practitioner (Hematology and Oncology) Ardelle Balls., MD (Neurology) Izora Gala, MD as Consulting Physician (Otolaryngology) Trula Slade, DPM as Consulting Physician (Podiatry)  DIAGNOSIS:    ICD-10-CM   1. Malignant neoplasm of upper-inner quadrant of right breast in female, estrogen receptor positive ([place])  C50.211    Z17.0   2. Iron deficiency anemia, unspecified iron deficiency anemia type  D50.9   3. Anemia of chronic disease  D63.8     SUMMARY OF ONCOLOGIC HISTORY: Oncology History  Breast cancer of upper-inner quadrant of right female breast ([place])  12/14/2015 Initial Diagnosis   Right breast biopsy 12:30 position: 2 masses, 1.6 cm mass: Invasive ductal carcinoma, grade 2, ER 0%, PR 0%, HER-2 negative, Ki-67 70%; satellite mass 8 mm: IDC grade 2 ER 5%, PR 5%, HER-2 negative, Ki-67 50%; T1cN0 stage IA clinical stage   01/18/2016 Surgery   Right lumpectomy: Multifocal IDC grade 3, 1.9 cm  ER 0%, PR 0%, HER-2 negative, Ki-67 70% and 0.8 cm satellite mass ER 5%, PR 2%, HER-2 negative, Ki-67 50%, high-grade DCIS, margins negative, 0/1 lymph nodes negative T1 cN0 stage IA   02/17/2016 - 04/06/2016 Chemotherapy   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    04/08/2016 - 04/23/2016 Hospital Admission   Hosp adm for cellulitis   05/24/2016 - 07/25/2016  Radiation Therapy    Adj XRT   06/29/2016 - 07/04/2016 Hospital Admission   Seizure like activity, MRI brain and EEG unremarkable    08/16/2016 -  Anti-estrogen oral therapy   Letrozole could not tolerate it due to dizziness and lightheadedness, switched to anastrozole 04/04/2017 switched to exemestane 05/22/2017     CHIEF COMPLIANT: Follow-upofsevereanemia  INTERVAL HISTORY: Hannah [name] is a 74 y.o. with above-mentioned history of right breast cancer treated with lumpectomy,adjuvant chemotherapy,radiation, and whois currently on antiestrogen therapywith exemestane.Shealsohas a history of anemiaandis currently receiving Retacritinjectionsand IV iron. Shepresents to the clinic today forfollow-up.   ALLERGIES:  is allergic to amoxicillin, tape, aldactone [spironolactone], arimidex [anastrozole], latex, and tetracycline.  MEDICATIONS:  Current Outpatient Medications  Medication Sig Dispense Refill   ACCU-CHEK GUIDE test strip Use to test blood sugar 3 times daily 150 strip 3   acetaminophen (TYLENOL) 325 MG tablet Take 2 tablets (650 mg total) by mouth every 6 (six) hours as needed for mild pain (or Fever >/= 101).     albuterol (VENTOLIN HFA) 108 (90 Base) MCG/ACT inhaler Inhale 2 puffs into the lungs every 6 (six) hours as needed for wheezing or shortness of breath. 18 g 5   amiodarone (PACERONE) 200 MG tablet TAKE ONE TABLET BY MOUTH DAILY 90 tablet 3   Blood Glucose Monitoring Suppl (ACCU-CHEK GUIDE ME) w/Device KIT Use accu chek meter to check blood sugar three times daily. 1 kit 0   budesonide-formoterol (SYMBICORT) 160-4.5 MCG/ACT inhaler Inhale 2 puffs into the lungs 2 (two) times daily. 1 Inhaler 5   Dulaglutide (TRULICITY) 1.5 UL/8.4TX SOPN Inject 1.5 mg into the  skin once a week. 6 mL 1   ELIQUIS 5 MG TABS tablet Take 1 tablet (5 mg total) by mouth 2 (two) times daily. 180 tablet 1   exemestane (AROMASIN) 25 MG tablet TAKE ONE TABLET BY MOUTH DAILY  AFTER BREAKFAST 90 tablet 3   ezetimibe (ZETIA) 10 MG tablet TAKE ONE TABLET BY MOUTH EVERY DAY 90 tablet 3   famotidine (PEPCID) 20 MG tablet Take 20 mg by mouth 2 (two) times daily.     furosemide (LASIX) 20 MG tablet Take 20 mg by mouth daily as needed.     insulin glargine (LANTUS) 100 UNIT/ML injection Inject 18 Units into the skin daily.     Insulin Lispro (HUMALOG KWIKPEN [place]) Inject 6-8 Units into the skin 3 (three) times daily.      Insulin Pen Needle (BD PEN NEEDLE NANO U/F) 32G X 4 MM MISC USE AS INSTRUCTED TO INJECT INSULIN 4 TIMES DAILY. 360 each 1   loratadine (CLARITIN) 10 MG tablet Take 10 mg by mouth daily.      metoprolol tartrate (LOPRESSOR) 25 MG tablet Take 1 tablet (25 mg total) by mouth 2 (two) times daily. Extra dose if needed for palpitations. 180 tablet 3   midodrine (PROAMATINE) 2.5 MG tablet Take 2.5 mg by mouth 3 (three) times daily with meals.     nitroGLYCERIN (NITROSTAT) 0.4 MG SL tablet Place 1 tablet (0.4 mg total) under the tongue every 5 (five) minutes as needed for chest pain. 30 tablet 3   ondansetron (ZOFRAN ODT) 4 MG disintegrating tablet Take 1 tablet (4 mg total) by mouth every 8 (eight) hours as needed. 10 tablet 0   oxyCODONE-acetaminophen (PERCOCET/ROXICET) 5-325 MG tablet Take 1 tablet by mouth every 4 (four) hours as needed for severe pain. 15 tablet 0   rosuvastatin (CRESTOR) 40 MG tablet Take 1 tablet (40 mg total) by mouth at bedtime. 90 tablet 3   traMADol (ULTRAM) 50 MG tablet Take 1 tablet (50 mg total) by mouth every 6 (six) hours as needed. 30 tablet 0   zolpidem (AMBIEN) 5 MG tablet Take 5 mg by mouth at bedtime.      No current facility-administered medications for this visit.    PHYSICAL EXAMINATION: ECOG PERFORMANCE STATUS: 1 - Symptomatic but completely ambulatory  Vitals:   02/04/20 1442  BP: (!) 120/54  Pulse: 69  Resp: 18  Temp: 97.6 F (36.4 C)  SpO2: 100%   Filed Weights   02/04/20 1442  Weight: 140 lb  4.8 oz (63.6 kg)    LABORATORY DATA:  I have reviewed the data as listed CMP Latest Ref Rng & Units 01/22/2020 12/31/2019 12/11/2019  Glucose 70 - 99 mg/dL 157(H) 289(H) 147(H)  BUN 8 - 23 mg/dL 48(H) 46(H) -  Creatinine 0.44 - 1.00 mg/dL 3.57(HH) 2.50(H) -  Sodium 135 - 145 mmol/L 136 138 -  Potassium 3.5 - 5.1 mmol/L 4.5 4.9 -  Chloride 98 - 111 mmol/L 105 101 -  CO2 22 - 32 mmol/L 22 28 -  Calcium 8.9 - 10.3 mg/dL 9.4 8.4(L) -  Total Protein 6.5 - 8.1 g/dL 6.1(L) 6.0(L) -  Total Bilirubin 0.3 - 1.2 mg/dL 1.1 0.6 -  Alkaline Phos 38 - 126 U/L 71 58 -  AST 15 - 41 U/L 27 55(H) -  ALT 0 - 44 U/L 26 32 -    Lab Results  Component Value Date   WBC 5.0 02/04/2020   HGB 11.4 (L) 02/04/2020   HCT 37.9  02/04/2020   MCV 101.3 (H) 02/04/2020   PLT 118 (L) 02/04/2020   NEUTROABS 3.8 02/04/2020    ASSESSMENT & PLAN:  Breast cancer of upper-inner quadrant of right female breast ([place]) Right breast cancer: IDC stage IA, ER positive, PR negative, HER-2 negative, s/p Taxotere/Cytoxan x 3 cycles, adjuvant radiation and Exemestane daily since 08/2016.  Anemia of chronic disease  Patient has had longstanding anemia where the hemoglobin was between 10 to 12 g. Since April 2020 her hemoglobin has gotten worse During the same timeframe her creatinine got from 1.5-2.44   Current treatment:Retacritinjectionsweeklystarted7/31/2020; changed to Aranesp 517m q3 weeks 11/12/2019 Bone marrow biopsy 04/08/2019: Normocellular bone marrow with trilineage hematopoiesis, absent iron stores, flow cytometry no abnormal B or T-cell clonal abnormalities noted.   IV iron therapy:Venofer: November 2020, July 2021 Retacrit: Last injection 01/15/2020 Lab review: 01/22/2020: Hemoglobin 10.3 (previously transfuse for hemoglobin of 7.5) 02/04/2020: Hemoglobin 11.4 We decided to not give her Retacrit injection today. Return to clinic in 3 weeks with labs and follow-up.   No orders of the defined types were  placed in this encounter.  The patient has a good understanding of the overall plan. she agrees with it. she will call with any problems that may develop before the next visit here.  Total time spent: 20 mins including face to face time and time spent for planning, charting and coordination of care  GNicholas Lose MD 02/04/2020  I, MCloyde ReamsDorshimer, am acting as scribe for Dr. VNicholas Lose  I have reviewed the above documentation for accuracy and completeness, and I agree with the above.

## 2020-02-04 ENCOUNTER — Inpatient Hospital Stay: Payer: Medicare Other

## 2020-02-04 ENCOUNTER — Inpatient Hospital Stay (HOSPITAL_BASED_OUTPATIENT_CLINIC_OR_DEPARTMENT_OTHER): Payer: Medicare Other | Admitting: Hematology and Oncology

## 2020-02-04 ENCOUNTER — Other Ambulatory Visit: Payer: Self-pay

## 2020-02-04 DIAGNOSIS — C50211 Malignant neoplasm of upper-inner quadrant of right female breast: Secondary | ICD-10-CM

## 2020-02-04 DIAGNOSIS — D638 Anemia in other chronic diseases classified elsewhere: Secondary | ICD-10-CM | POA: Diagnosis not present

## 2020-02-04 DIAGNOSIS — Z170 Estrogen receptor positive status [ER+]: Secondary | ICD-10-CM | POA: Diagnosis not present

## 2020-02-04 DIAGNOSIS — D509 Iron deficiency anemia, unspecified: Secondary | ICD-10-CM | POA: Diagnosis not present

## 2020-02-04 DIAGNOSIS — I129 Hypertensive chronic kidney disease with stage 1 through stage 4 chronic kidney disease, or unspecified chronic kidney disease: Secondary | ICD-10-CM | POA: Diagnosis not present

## 2020-02-04 LAB — CMP (CANCER CENTER ONLY)
ALT: 43 U/L (ref 0–44)
AST: 54 U/L — ABNORMAL HIGH (ref 15–41)
Albumin: 2.6 g/dL — ABNORMAL LOW (ref 3.5–5.0)
Alkaline Phosphatase: 128 U/L — ABNORMAL HIGH (ref 38–126)
Anion gap: 5 (ref 5–15)
BUN: 28 mg/dL — ABNORMAL HIGH (ref 8–23)
CO2: 24 mmol/L (ref 22–32)
Calcium: 9.6 mg/dL (ref 8.9–10.3)
Chloride: 111 mmol/L (ref 98–111)
Creatinine: 2.75 mg/dL — ABNORMAL HIGH (ref 0.44–1.00)
GFR, Est AFR Am: 19 mL/min — ABNORMAL LOW (ref >60)
GFR, Estimated: 16 mL/min — ABNORMAL LOW (ref >60)
Glucose, Bld: 131 mg/dL — ABNORMAL HIGH (ref 70–99)
Potassium: 5.2 mmol/L — ABNORMAL HIGH (ref 3.5–5.1)
Sodium: 140 mmol/L (ref 135–145)
Total Bilirubin: 0.5 mg/dL (ref 0.3–1.2)
Total Protein: 6.4 g/dL — ABNORMAL LOW (ref 6.5–8.1)

## 2020-02-04 LAB — CBC WITH DIFFERENTIAL (CANCER CENTER ONLY)
Abs Immature Granulocytes: 0.01 10*3/uL (ref 0.00–0.07)
Basophils Absolute: 0 10*3/uL (ref 0.0–0.1)
Basophils Relative: 1 %
Eosinophils Absolute: 0.2 10*3/uL (ref 0.0–0.5)
Eosinophils Relative: 5 %
HCT: 37.9 % (ref 36.0–46.0)
Hemoglobin: 11.4 g/dL — ABNORMAL LOW (ref 12.0–15.0)
Immature Granulocytes: 0 %
Lymphocytes Relative: 10 %
Lymphs Abs: 0.5 10*3/uL — ABNORMAL LOW (ref 0.7–4.0)
MCH: 30.5 pg (ref 26.0–34.0)
MCHC: 30.1 g/dL (ref 30.0–36.0)
MCV: 101.3 fL — ABNORMAL HIGH (ref 80.0–100.0)
Monocytes Absolute: 0.4 10*3/uL (ref 0.1–1.0)
Monocytes Relative: 8 %
Neutro Abs: 3.8 10*3/uL (ref 1.7–7.7)
Neutrophils Relative %: 76 %
Platelet Count: 118 10*3/uL — ABNORMAL LOW (ref 150–400)
RBC: 3.74 MIL/uL — ABNORMAL LOW (ref 3.87–5.11)
RDW: 18.4 % — ABNORMAL HIGH (ref 11.5–15.5)
WBC Count: 5 10*3/uL (ref 4.0–10.5)
nRBC: 0 % (ref 0.0–0.2)

## 2020-02-04 LAB — IRON AND TIBC
Iron: 88 ug/dL (ref 41–142)
Saturation Ratios: 42 % (ref 21–57)
TIBC: 210 ug/dL — ABNORMAL LOW (ref 236–444)
UIBC: 122 ug/dL (ref 120–384)

## 2020-02-04 LAB — RETIC PANEL
Immature Retic Fract: 8.4 % (ref 2.3–15.9)
RBC.: 3.74 MIL/uL — ABNORMAL LOW (ref 3.87–5.11)
Retic Count, Absolute: 69.2 10*3/uL (ref 19.0–186.0)
Retic Ct Pct: 1.9 % (ref 0.4–3.1)
Reticulocyte Hemoglobin: 31.6 pg (ref >27.9)

## 2020-02-04 LAB — SAMPLE TO BLOOD BANK

## 2020-02-04 LAB — FERRITIN: Ferritin: 315 ng/mL — ABNORMAL HIGH (ref 11–307)

## 2020-02-04 NOTE — Assessment & Plan Note (Signed)
Patient has had longstanding anemia where the hemoglobin was between 10 to 12 g. Since April 2020 her hemoglobin has gotten worse During the same timeframe her creatinine got from 1.5-2.44   Current treatment:Retacritinjectionsweeklystarted7/31/2020; changed to Aranesp 539m q3 weeks 11/12/2019 Bone marrow biopsy 04/08/2019: Normocellular bone marrow with trilineage hematopoiesis, absent iron stores, flow cytometry no abnormal B or T-cell clonal abnormalities noted.   IV iron therapy:Venofer: November 2020, July 2021 Lab review: 01/22/2020: Hemoglobin 10.3 (previously transfuse for hemoglobin of 7.5) 02/04/2020:

## 2020-02-04 NOTE — Progress Notes (Unsigned)
Ulice Dash from lab called; pt hgb 11.4. Wilber Bihari, NP aware

## 2020-02-04 NOTE — Assessment & Plan Note (Signed)
Right breast cancer: IDC stage IA, ER positive, PR negative, HER-2 negative, s/p Taxotere/Cytoxan x 3 cycles, adjuvant radiation and Exemestane daily since 08/2016.

## 2020-02-05 ENCOUNTER — Telehealth: Payer: Self-pay | Admitting: Hematology and Oncology

## 2020-02-05 ENCOUNTER — Telehealth (HOSPITAL_COMMUNITY): Payer: Self-pay | Admitting: Radiology

## 2020-02-05 NOTE — Telephone Encounter (Signed)
Called pt to let her know that we are waiting for insurance authorization for her KP/VP. We will call her when we receive auth. She agrees with this plan. JM

## 2020-02-05 NOTE — Telephone Encounter (Signed)
Scheduled per 8/26 los. Called and left a msg, mailing appt letter and calendar printout

## 2020-02-06 IMAGING — CT CT ANGIO CHEST
3 of 11 series · 18 of 46 positions shown · IV contrast (APPLIED)
Comparison: Chest x-ray dated 05/21/2018

CLINICAL DATA: Chest pain. Back pain.

EXAM:
CT ANGIOGRAPHY CHEST WITH CONTRAST
TECHNIQUE: Multidetector CT imaging of the chest was performed using the
standard protocol during bolus administration of intravenous
contrast. Multiplanar CT image reconstructions and MIPs were
obtained to evaluate the vascular anatomy.
CONTRAST:  100mL [id number] IOPAMIDOL ([id number]) INJECTION 76%

[Series 8: thins · axial · 0.61mm/px · z∈[-445,-260]mm · 11 of 318 slices shown (1 of 2)]
[im 27/318  lung]
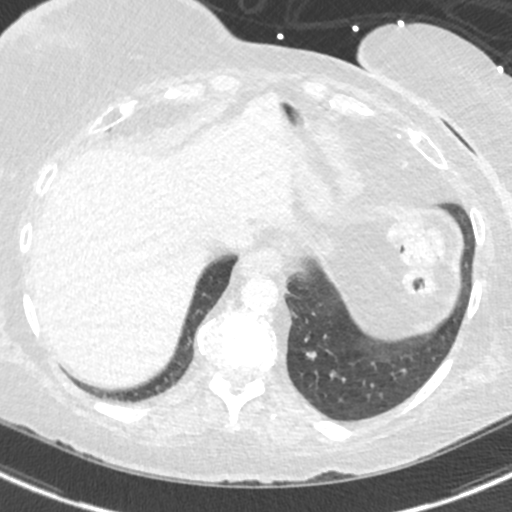
[im 53/318  soft-tissue]
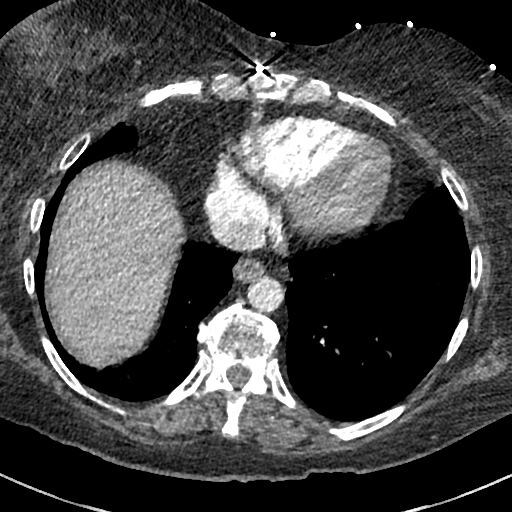
[im 80/318  lung]
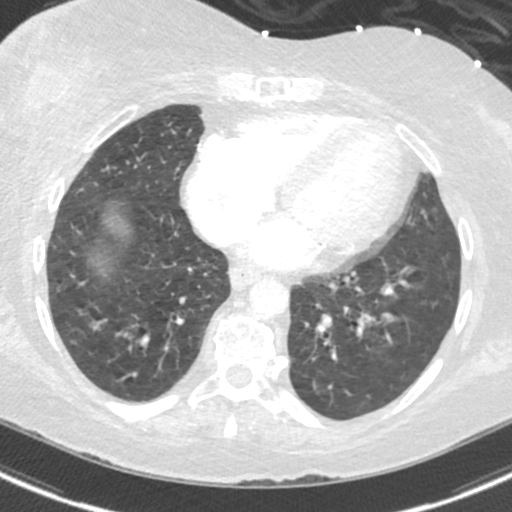
[im 106/318  soft-tissue]
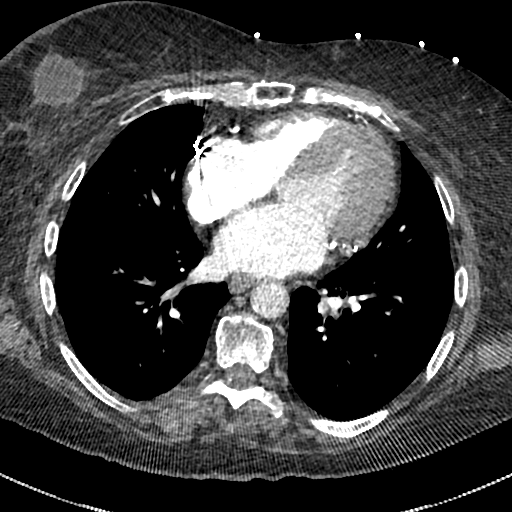
[im 133/318  lung]
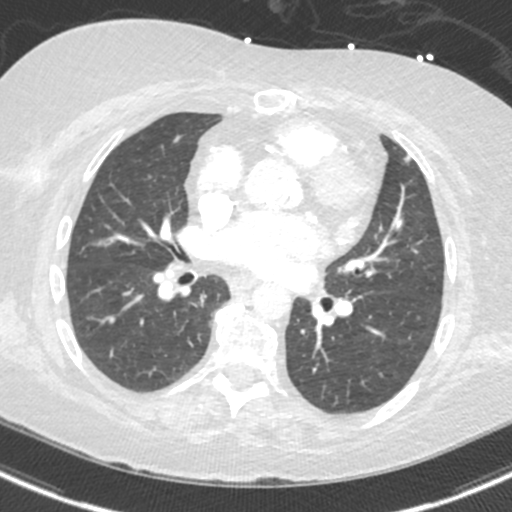
[im 159/318  soft-tissue]
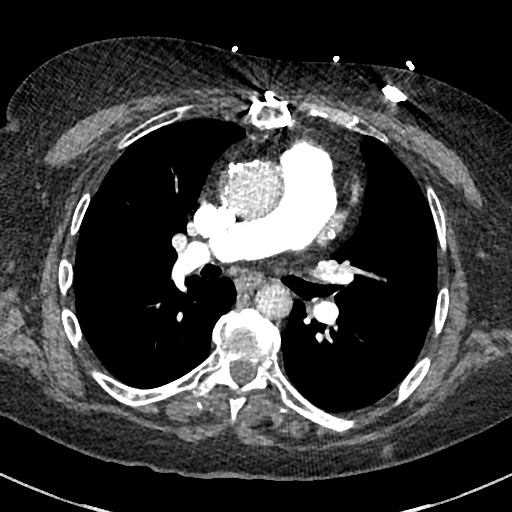
[im 185/318  lung]
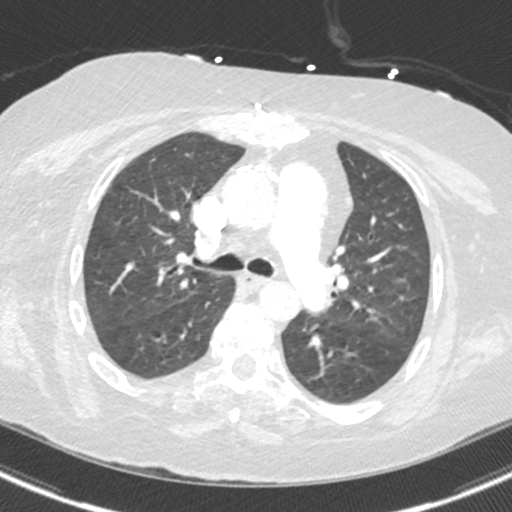
[im 212/318  soft-tissue]
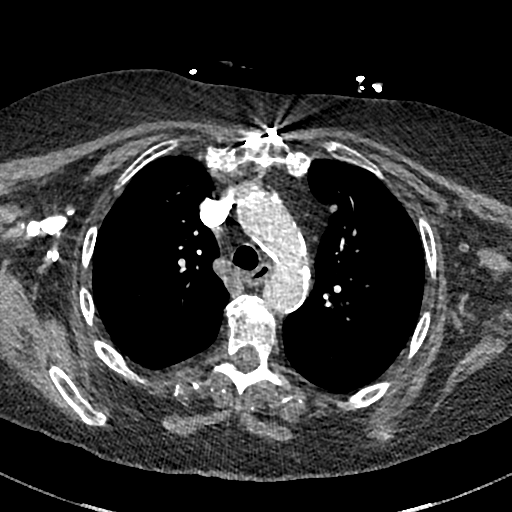
[im 238/318  lung]
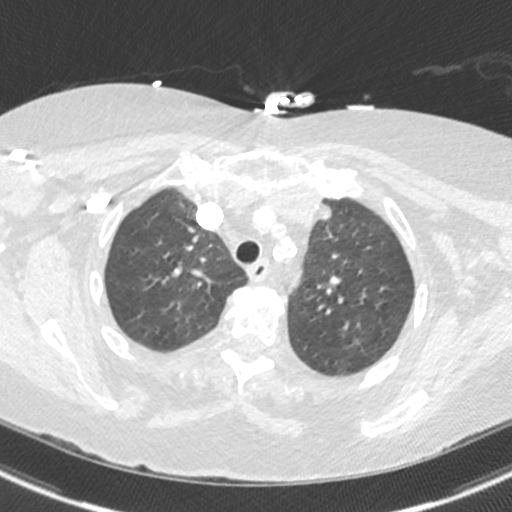
[im 265/318  soft-tissue]
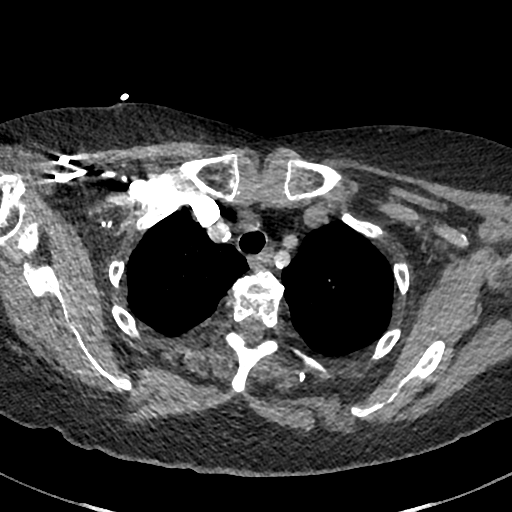
[im 291/318  lung]
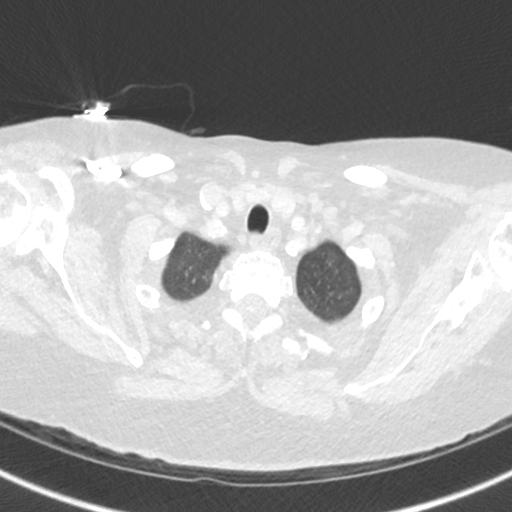

[Series 9: cor · coronal · 0.45mm/px · 3 of 151 slices shown]
[im 38/151  soft-tissue]
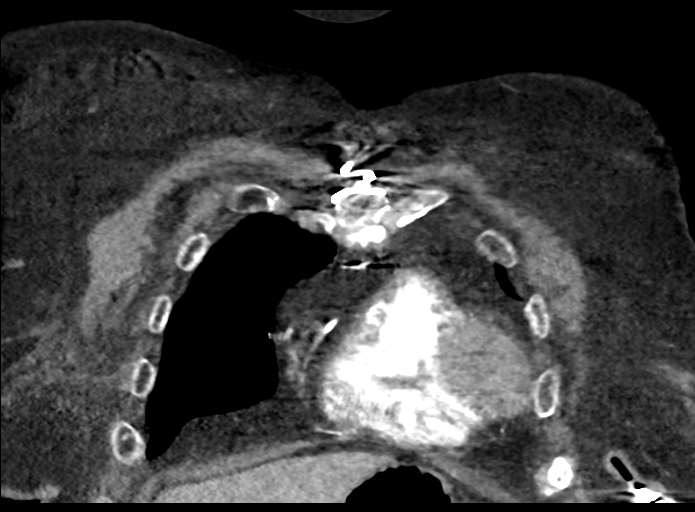
[im 76/151  soft-tissue]
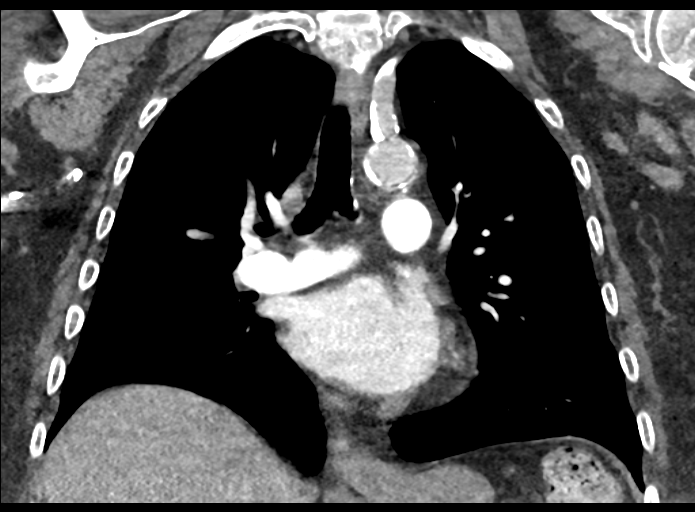
[im 113/151  soft-tissue]
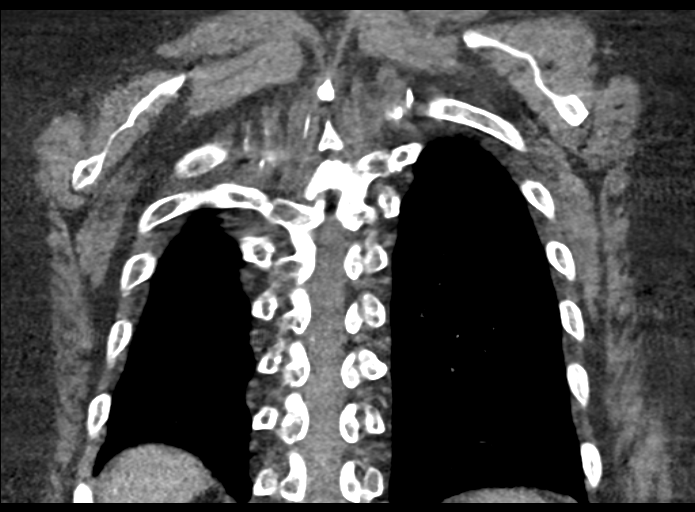

[Series 15: thins · axial · 0.67mm/px · z∈[-489,-432]mm · 4 of 162 slices shown (2 of 2)]
[im 27/162  lung]
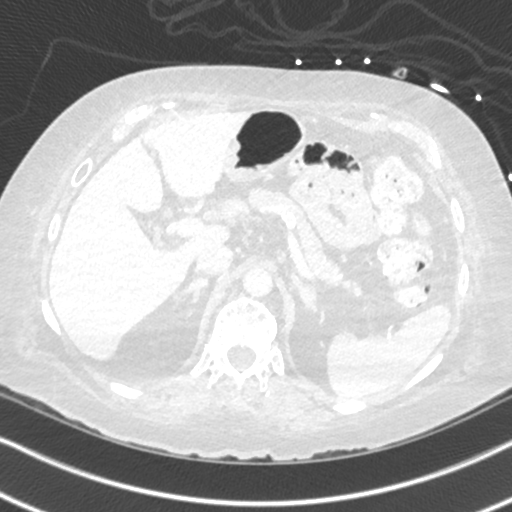
[im 54/162  lung]
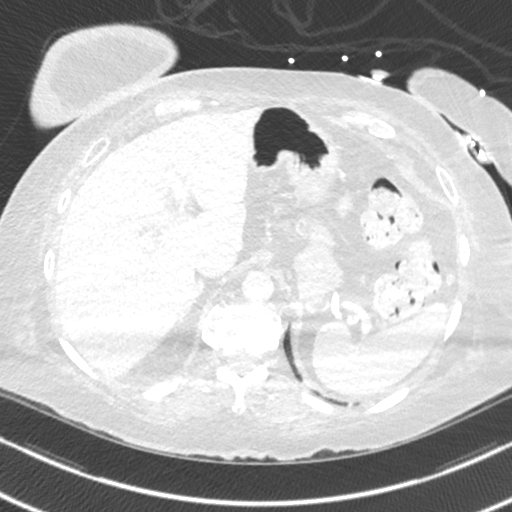
[im 81/162  lung]
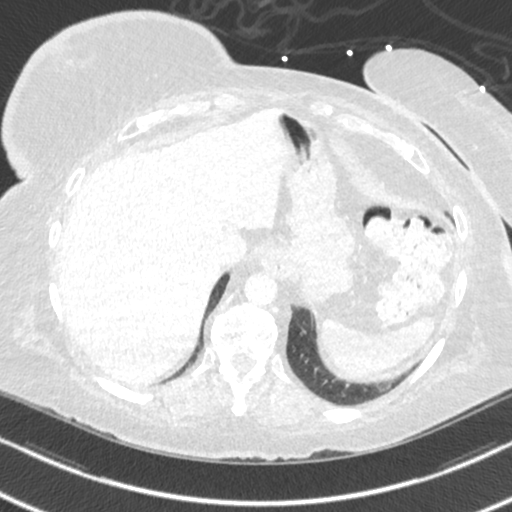
[im 108/162  lung]
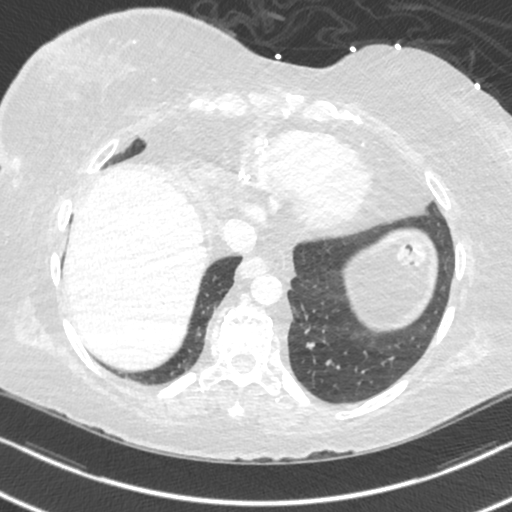

[18 of 46 positions shown; findings below may reference images not displayed]

FINDINGS: Cardiovascular: Satisfactory opacification of the pulmonary arteries
to the segmental level. No evidence of pulmonary embolism. Normal
heart size. Extensive aortic atherosclerosis and coronary artery
calcifications. No pericardial effusion.

Mediastinum/Nodes: No enlarged mediastinal, hilar, or axillary lymph
nodes. Thyroid gland, trachea, and esophagus demonstrate no
significant findings.

Lungs/Pleura: Lungs are clear. No pleural effusion or pneumothorax.

Upper Abdomen: No acute abnormality.

Musculoskeletal: No chest wall abnormality. No acute or significant
osseous findings. There is a 4 cm cystic structure in the right
breast consistent with a seroma from previous breast surgery.
Markers are adjacent to the cyst.

Review of the MIP images confirms the above findings.
IMPRESSION: 1. No pulmonary emboli or other acute abnormalities.
2. Benign-appearing seroma in the right breast.

Aortic Atherosclerosis ([id number]-9NB.B).

## 2020-02-06 IMAGING — DX DG CHEST 2V
2 series · 2 of 2 positions shown · non-contrast
Comparison: Radiographs February 04, 2018.

CLINICAL DATA: Back pain.

EXAM:
CHEST - 2 VIEW

[chest pa]
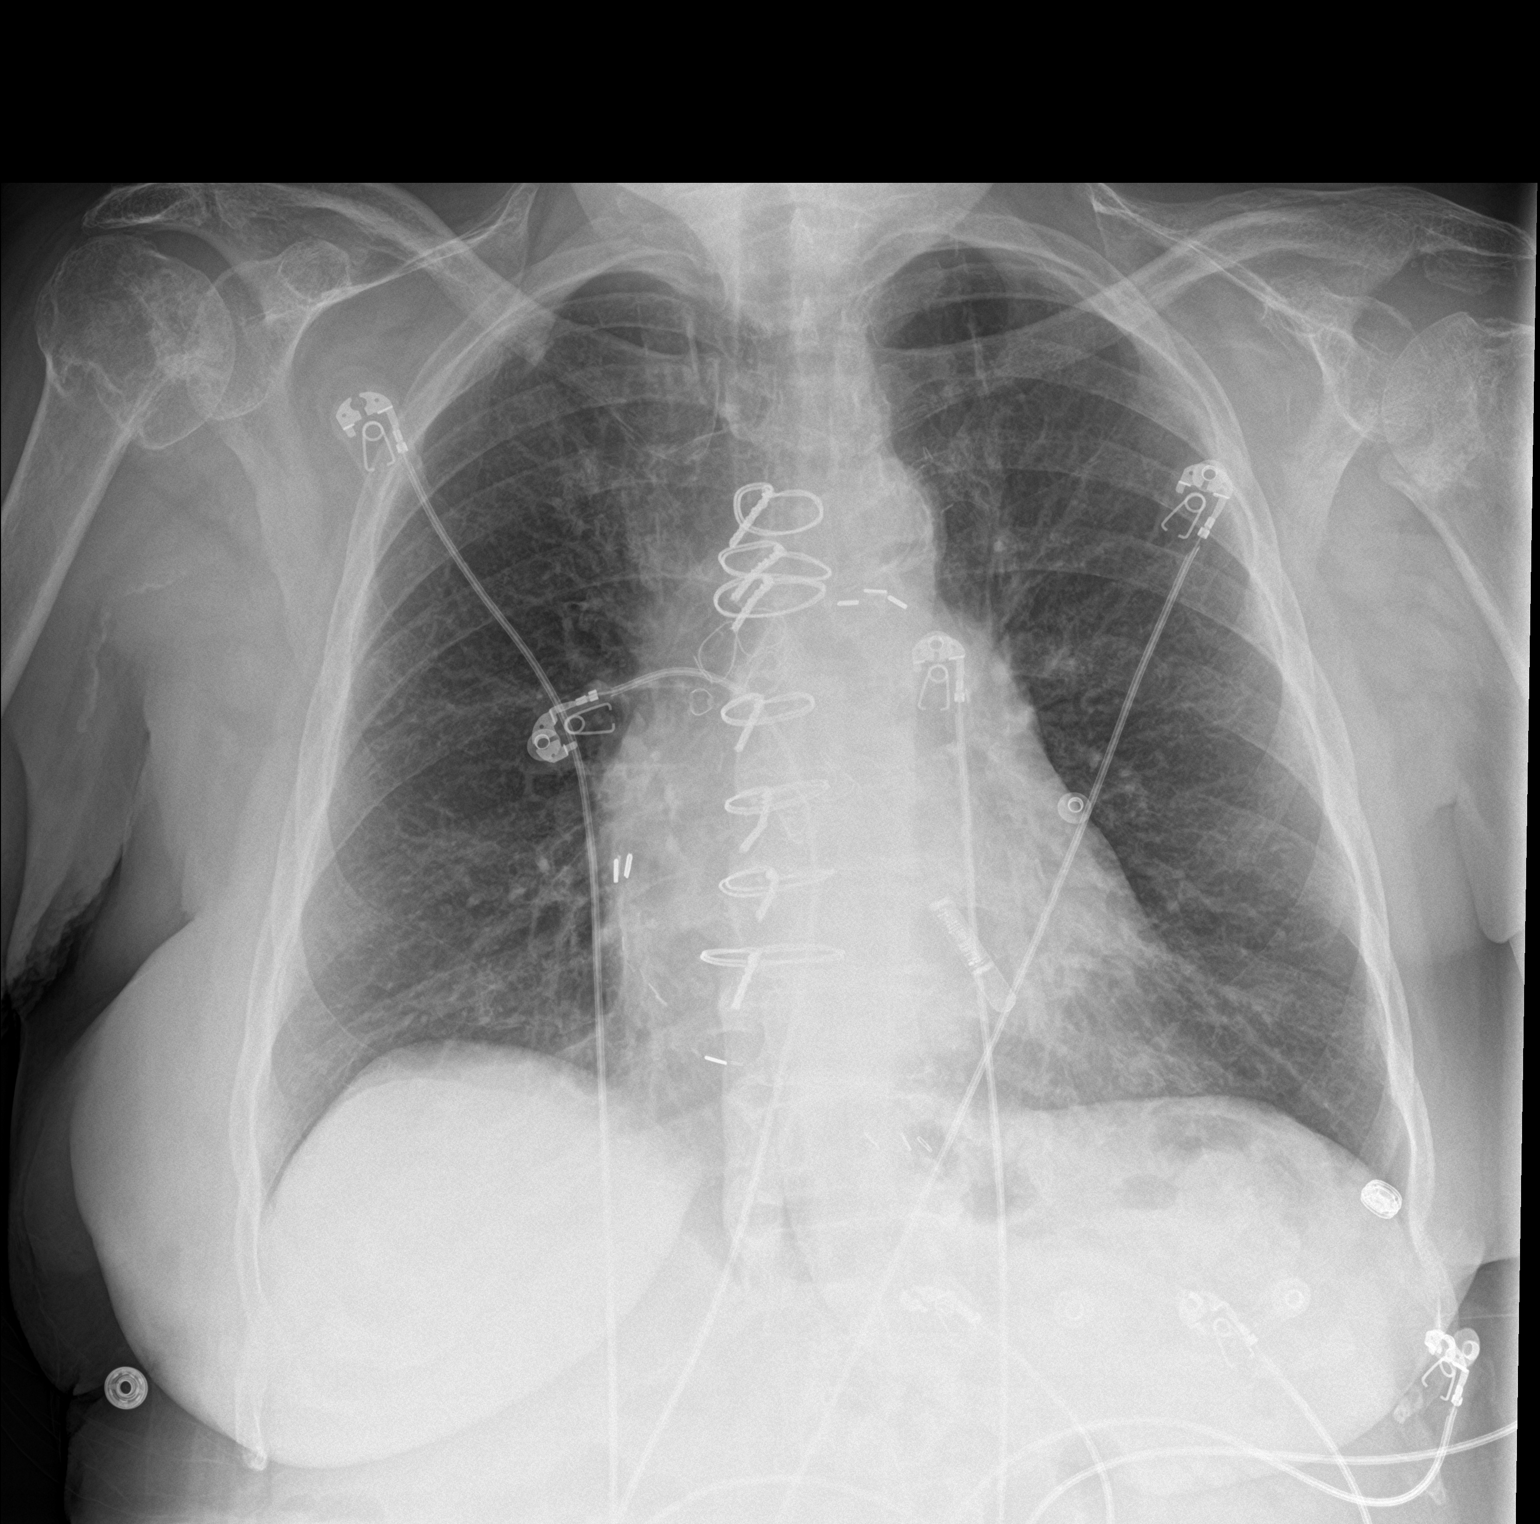

[chest lat]
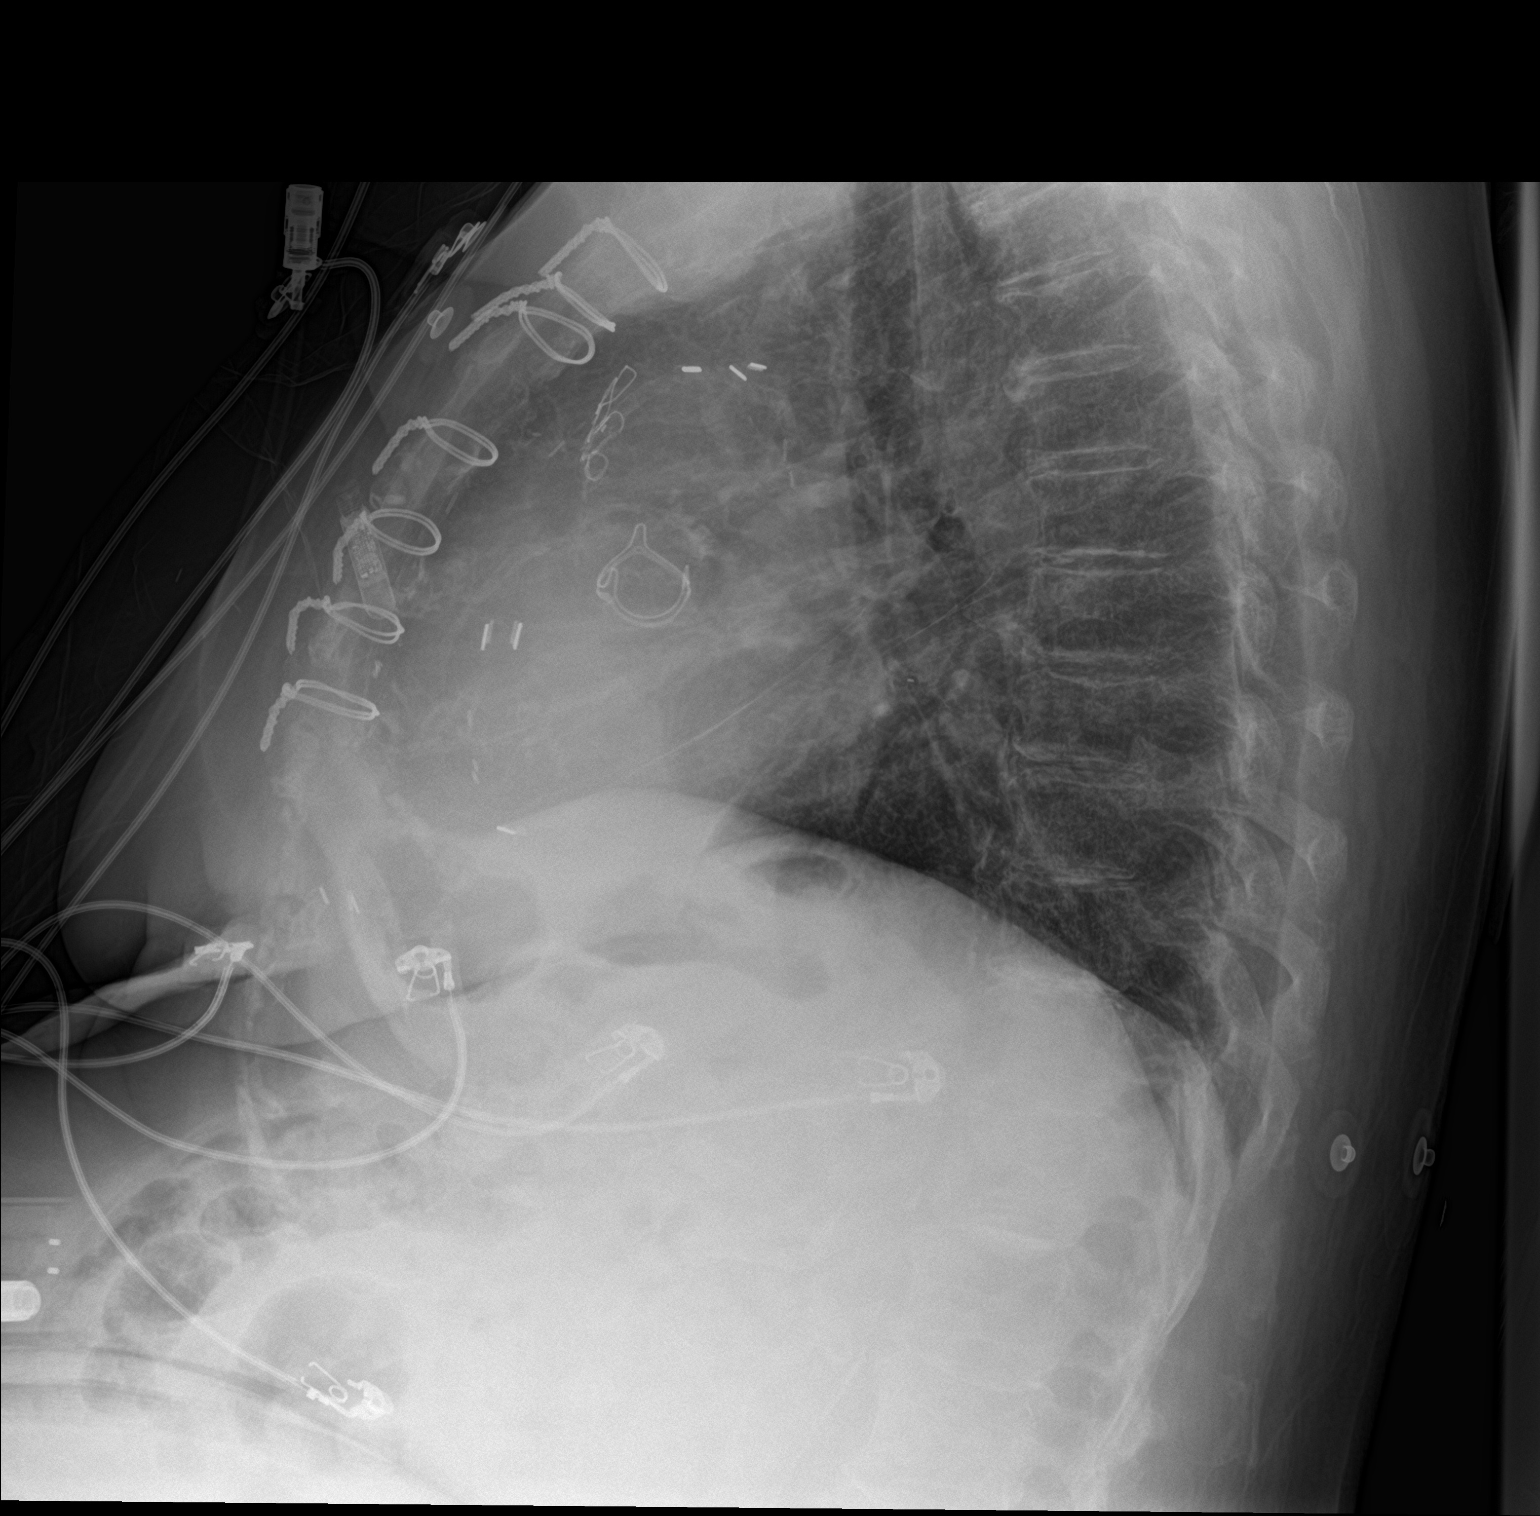

[2 of 2 positions shown; findings below may reference images not displayed]

FINDINGS: The heart size and mediastinal contours are within normal limits.
Status post aortic valve replacement. No pneumothorax or pleural
effusion is noted. Both lungs are clear. The visualized skeletal
structures are unremarkable.
IMPRESSION: No active cardiopulmonary disease.

## 2020-02-06 IMAGING — CT CT ANGIO NECK
1 of 12 series · 5 of 33 positions shown · IV contrast (APPLIED)
Comparison: None.

CLINICAL DATA: Generalized weakness and headache.

EXAM:
CT ANGIOGRAPHY HEAD AND NECK
TECHNIQUE: Multidetector CT imaging of the head and neck was performed using
the standard protocol during bolus administration of intravenous
contrast. Multiplanar CT image reconstructions and MIPs were
obtained to evaluate the vascular anatomy. Carotid stenosis
measurements (when applicable) are obtained utilizing NASCET
criteria, using the distal internal carotid diameter as the
denominator.
CONTRAST:  100mL [id number] IOPAMIDOL ([id number]) INJECTION 76%

[Series 13: ax thins · axial · 0.39mm/px · z∈[-265,-45]mm · 5 of 331 slices shown]
[im 56/331  soft-tissue]
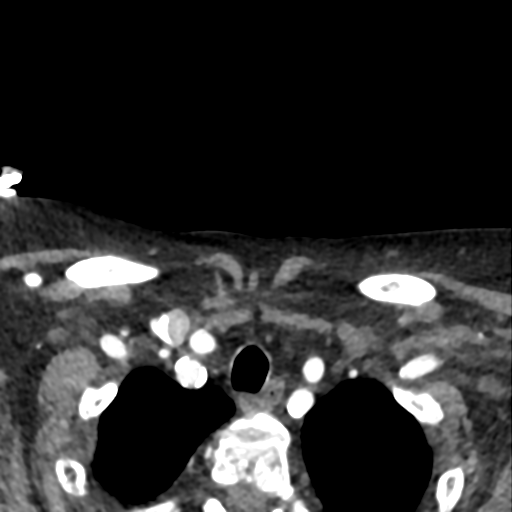
[im 111/331  bone]
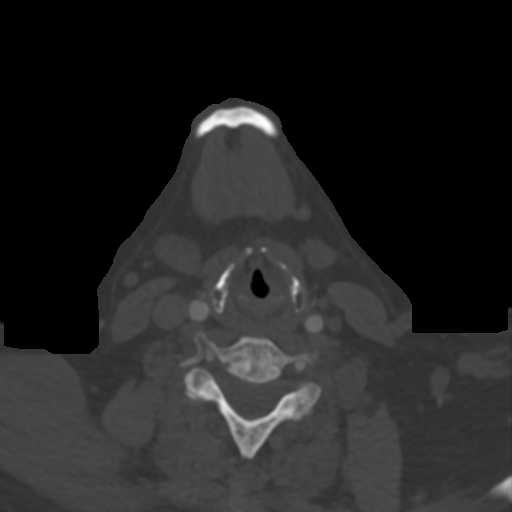
[im 166/331  soft-tissue]
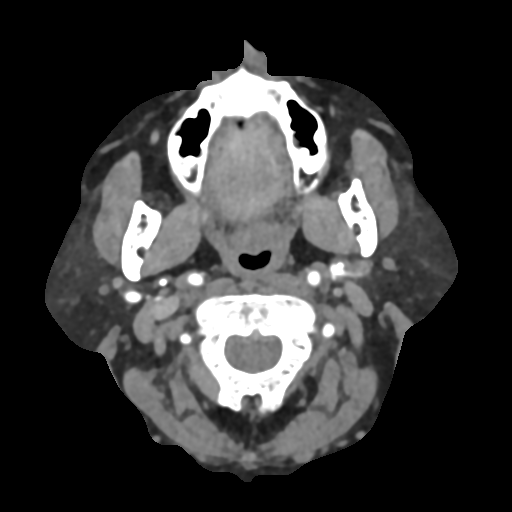
[im 221/331  bone]
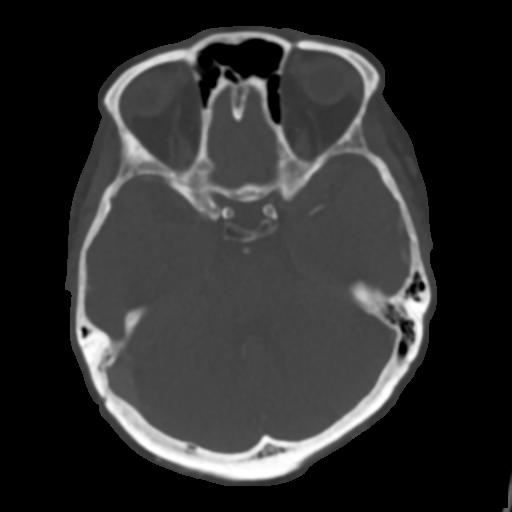
[im 276/331  soft-tissue]
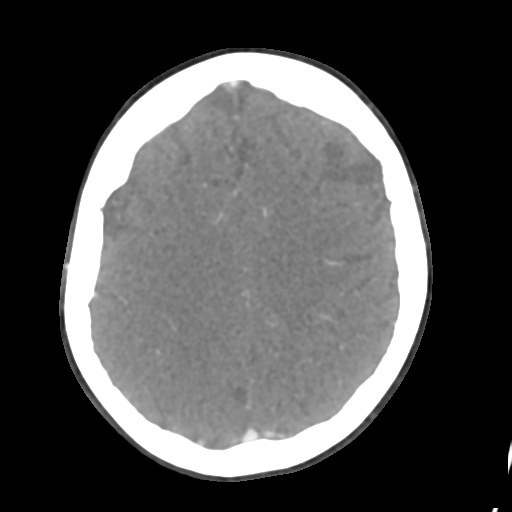

[5 of 33 positions shown; findings below may reference images not displayed]

FINDINGS: CT HEAD FINDINGS

Brain: There is no mass, hemorrhage or extra-axial collection. The
size and configuration of the ventricles and extra-axial CSF spaces
are normal. There is no acute or chronic infarction. The brain
parenchyma is normal.

Skull: The visualized skull base, calvarium and extracranial soft
tissues are normal.

Sinuses/Orbits: No fluid levels or advanced mucosal thickening of
the visualized paranasal sinuses. No mastoid or middle ear effusion.
The orbits are normal.

CTA NECK FINDINGS

SKELETON: There is no bony spinal canal stenosis. No lytic or
blastic lesion.

OTHER NECK: Normal pharynx, larynx and major salivary glands. No
cervical lymphadenopathy. Unremarkable thyroid gland.

UPPER CHEST: No pneumothorax or pleural effusion. No nodules or
masses.

AORTIC ARCH: There is mild calcific atherosclerosis of the aortic
arch. There is no aneurysm, dissection or hemodynamically
significant stenosis of the visualized ascending aorta and aortic
arch. Conventional 3 vessel aortic branching pattern. The visualized
proximal subclavian arteries are widely patent.

RIGHT CAROTID SYSTEM:

--Common carotid artery: Widely patent origin without common carotid
artery dissection or aneurysm.

--Internal carotid artery: No dissection, occlusion or aneurysm.
There is calcified atherosclerosis extending into the proximal ICA,
resulting in 60% stenosis.

--External carotid artery: No acute abnormality.

LEFT CAROTID SYSTEM:

--Common carotid artery: Widely patent origin without common carotid
artery dissection or aneurysm.

--Internal carotid artery: No dissection, occlusion or aneurysm.
There is calcified atherosclerosis extending into the proximal ICA,
resulting in less than 50% stenosis.

--External carotid artery: No acute abnormality.

VERTEBRAL ARTERIES: Codominant configuration. There is
atherosclerotic calcification of both vertebral artery origins.
There is atherosclerotic calcification of both V3 segments without
flow limiting stenosis. No dissection.

CTA HEAD FINDINGS

ANTERIOR CIRCULATION:

--Intracranial internal carotid arteries: Atherosclerotic
calcification of the internal carotid arteries at the skull base
without hemodynamically significant stenosis.

--Anterior cerebral arteries: Normal. Both A1 segments are present.
Patent anterior communicating artery.

--Middle cerebral arteries: Normal.

--Posterior communicating arteries: Present on the left, absent on
the right.

POSTERIOR CIRCULATION:

--Basilar artery: Normal.

--Posterior cerebral arteries: Normal.

--Superior cerebellar arteries: Normal.

--Inferior cerebellar arteries: Normal anterior and posterior
inferior cerebellar arteries.

VENOUS SINUSES: As permitted by contrast timing, patent.

ANATOMIC VARIANTS: None

DELAYED PHASE: No parenchymal contrast enhancement.

Review of the MIP images confirms the above findings.
IMPRESSION: 1. No emergent large vessel occlusion or high-grade stenosis.
2. Approximately 60% stenosis of the proximal right internal carotid
artery secondary to calcified atherosclerosis.
3.  Aortic atherosclerosis ([id number]).
4. Bilateral atherosclerotic narrowing of the vertebral artery
origins.

## 2020-02-08 ENCOUNTER — Other Ambulatory Visit (HOSPITAL_COMMUNITY): Payer: Self-pay | Admitting: Interventional Radiology

## 2020-02-08 ENCOUNTER — Ambulatory Visit (INDEPENDENT_AMBULATORY_CARE_PROVIDER_SITE_OTHER): Payer: Medicare Other | Admitting: *Deleted

## 2020-02-08 ENCOUNTER — Telehealth: Payer: Self-pay | Admitting: *Deleted

## 2020-02-08 DIAGNOSIS — R55 Syncope and collapse: Secondary | ICD-10-CM

## 2020-02-08 DIAGNOSIS — S32010A Wedge compression fracture of first lumbar vertebra, initial encounter for closed fracture: Secondary | ICD-10-CM

## 2020-02-08 NOTE — Telephone Encounter (Signed)
   Primary Cardiologist: Truitt Merle, NP  Chart reviewed as part of pre-operative protocol coverage - will direct for pharm for input on Eliquis given impending surgical date then pt will need call.  Charlie Pitter, PA-C 02/08/2020, 5:22 PM

## 2020-02-08 NOTE — Telephone Encounter (Signed)
   [facility] Pre-operative Risk Assessment    HEARTCARE STAFF: - Please ensure there is not already an duplicate clearance open for this procedure. - Under Visit Info/Reason for Call, type in Other and utilize the format Clearance MM/DD/YY or Clearance TBD. Do not use dashes or single digits. - If request is for dental extraction, please clarify the # of teeth to be extracted.  Request for surgical clearance:  1. What type of surgery is being performed? KYPHOPLASTY    2. When is this surgery scheduled? 02/12/20   3. What type of clearance is required (medical clearance vs. Pharmacy clearance to hold med vs. Both)? BOTH  4. Are there any medications that need to be held prior to surgery and how long? ELIQUIS x 48 HOURS   5. Practice name and name of physician performing surgery? [facility]; DR. Willaim Rayas DEVESHWAR   6. What is the office phone number? NOT LISTED   7.   What is the office fax number? [phone]  8.   Anesthesia type (None, local, MAC, general) ? NOT LISTED   Julaine Hua 02/08/2020, 5:15 PM  _________________________________________________________________   (provider comments below)

## 2020-02-09 ENCOUNTER — Other Ambulatory Visit: Payer: Self-pay | Admitting: Pharmacist Clinician (PhC)/ Clinical Pharmacy Specialist

## 2020-02-09 LAB — CUP PACEART REMOTE DEVICE CHECK
Date Time Interrogation Session: 20210827235046
Implantable Pulse Generator Implant Date: 20190322

## 2020-02-09 MED ORDER — APIXABAN 2.5 MG PO TABS: 2.5000 mg | ORAL_TABLET | Freq: Two times a day (BID) | ORAL | 1 refills | Status: DC

## 2020-02-09 NOTE — Telephone Encounter (Signed)
Spoke w/ Truitt Merle personally then called patient for pre-op - see other phone note. Taken care of.

## 2020-02-09 NOTE — Progress Notes (Signed)
   CARDIOLOGY OFFICE NOTE  Date:  02/23/2020    Abbygail M Dahle Date of Birth: 04/22/1946 Medical Record #[id number]  PCP:  Skakle, [place], DO  Cardiologist:  [facility]   Chief Complaint  Patient presents with  . Follow-up    History of Present Illness: Ahonesty M Firmin is a 74 y.o. female who presents today for a follow up visit.  Seen for Dr. Taylor.Primarily follows with me.  She has a very complex/extensivehistory - this includes ahistory ofCAD s/p CABG (1998 with redo 2009- Dr. Bartle), bioprosthetic AVR in 2009, chronic diastolic CHF, asthma, carotid artery disease (1-39% by duplex 10/2017, due 2021), CKD stage IV(Dr. Patel), PAF- has been on Multaq in the past and now on amiodarone, atrial tachycardia, breast CA, orthostasis,chronicanemia, thrombocytopenia, mild dilation of ascending aorta by echo 10/2018, recurrent syncope/orthostasis,priorrecurrentrightpleural effusion requiring thoracentesis4/2020and 12/2020with subsequent Pleurex/talc by Dr. Bartle, HTN, GERD,&HLD.   I have followed her for many years - she has had periodic issues with volume overload, orthostasis and tachy palpitations.Progressive CKD. Last cath in 2014 - grafts patent. Myoview in 2015 without ischemia.  She has had worsening anemia. She has ended up having multiple repeat breast biopsies that were negative following abnormal mammogram (after her breast cancer). She has had worsening orthostasis - to the point of injury - fell in our parking lot in 02/2019 with significant laceration to the head - she is back on Midodrine. Was not able to have colonoscopy due to those events. EF remains normal. She has had recurrent kidney stones that has resulted in worsening CKD. She requires periodic transfusions for her anemia.   When seen towards the end of June - trying to do more in the way of activity. Had been switched from Retacrit to Aranesp by hematology. Feeling better with higher dose  of Imdur from earlier this summer. She has not been vaccinated - half of her family has not - we had a long discussion about this.   Last seen in August - had required more transfusions. Was using less Lasix. BP lower and although her chest pain seemed better with Imdur, she thought she was not tolerating so this was stopped after her visit. Unfortunately - she suffered a fall - has had to have kyphoplasty. Her kidney function continues to deteriorate. Discussed going back to Gi for more thorough evaluation given her ongoing anemia and difficulty maintaining her HGB.   Comes in today. Here with Robert. Still little "weak in her back" since the kyphoplasty. Better pain control. Slow to recover. No recent use of Lasix. Her angina has returned with the discontinuation of her Imdur - seems tolerable with her other medicines. "Not as bad, not as often". She has someone with her all the time. Getting mammogram tomorrow and then for MRI - she says she would not have repeat biopsy like last year. Seeing Renal and Hematology within the next week as well. Still with some spells of "just needing to stop/dizzy" - sugar gets checked and is ok - Bp is typically ok. Hard to say what this is coming from. Weight continues to trend down. She is not hungry. Has had recent UTI - could not tolerate Bactrim - for repeat urine next week.  Robert wishes she had her counts checked more often - tried to explain the rationale for this. HGB does seem to be dropping again.   Past Medical History:  Diagnosis Date  . Abnormally small mouth   . Allergic rhinitis 10/14/2009     Qualifier: Diagnosis of  By: Lamonte Sakai MD, Rose Fillers   Overview:  Overview:  Qualifier: Diagnosis of  By: Lamonte Sakai MD, Rose Fillers  Last Assessment & Plan:  Please continue Xyzal and Nasacort as you have been using them  . Anemia   . Asthma 05/12/2009   10/12/2014 p extensive coaching HFA effectiveness =    75% s spacer    Overview:  Overview:  10/12/2014 p extensive coaching HFA  effectiveness =    75% s spacer   Last Assessment & Plan:  Please continue Symbicort 2 puffs twice a day. Remember to rinse and gargle after taking this medication. Take albuterol 2 puffs up to every 4 hours if needed for shortness of breath.  Follow with Dr Lamonte Sakai in 6 month  . Bilateral carotid artery stenosis    a. mild 1-39% by duplex 10/2017, due 2021.  . Breast cancer ([place]) 01/18/2016   right breast  . CAD (coronary artery disease) 01/24/2011   a. s/p CABG 1998 with redo 2009.  Marland Kitchen Chemotherapy induced nausea and vomiting 02/24/2016  . Chemotherapy-induced peripheral neuropathy ([place]) 04/27/2016  . 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 Chronic diastolic CHF (congestive heart failure) ([place]) 09/24/2013  . CKD (chronic kidney disease), stage IV ([place]) 09/24/2013  . Closed fracture of head of left humerus 09/09/2017  . Complication of anesthesia   . Dysrhythmia    a-fib  . Gallstones   . GERD (gastroesophageal reflux disease)   . Glaucoma   . H/O atrial tachycardia 05/12/2009   Qualifier: History of  By: Lamonte Sakai MD, Rose Fillers   Overview:  Overview:  Qualifier: History of  By: Lamonte Sakai MD, Rose Fillers  Last Assessment & Plan:  There was no evidence of this on her cardiac monitor. I would recommend watchful waiting.   Marland Kitchen Heart murmur   . History of kidney stones   . History of non-ST elevation myocardial infarction (NSTEMI)    Sept 2014--  thought to be type II HTN w/ LHC without infarct related artery and patent grafts  . History of radiation therapy 05/24/16-07/26/16   right breast 50.4 Gy in 28 fractions, right breast boost 10 Gy in 5 fractions  . Hyperlipidemia   . Hypotension   . Iron deficiency anemia   . Mania ([place]) 03/05/2016  . Moderate persistent asthma    pulmologist-  Dr. Malvin Johns  . Osteomyelitis of toe of left foot ([place]) 04/06/2016  . Osteopenia of multiple sites 10/19/2015  . PAF (paroxysmal atrial fibrillation) ([place])   . Personal history of chemotherapy 2017  . Personal history  of radiation therapy 2017  . PONV (postoperative nausea and vomiting)   . Port catheter in place 02/17/2016  . Psoriasis    right leg  . Renal calculus, right   . S/P AVR    prosthesis valve placement 2009 at same time re-do CABG  . Seizure-like activity ([place]) 09/01/2017  . Sensorineural hearing loss (SNHL) of both ears 01/06/2016  . Stroke [facility]) 2014   residual rt hearing loss  . Syncope 09/06/2017  . Type 2 diabetes mellitus ([place])    monitored by dr Dwyane Dee    Past Surgical History:  Procedure Laterality Date  . AORTIC VALVE REPLACEMENT  2009   [facility] (done same time is CABG)  . BREAST LUMPECTOMY Right 01/18/2016  . BREAST LUMPECTOMY WITH RADIOACTIVE SEED AND SENTINEL LYMPH NODE BIOPSY Right 01/18/2016   Procedure: RIGHT BREAST LUMPECTOMY WITH RADIOACTIVE SEED AND SENTINEL LYMPH NODE BIOPSY;  Surgeon: David Newman, MD;  Location: [facility];  Service: General;  Laterality: Right;  . CARDIAC CATHETERIZATION  03/23/2008   Pre-redo CABG: L main OK, LAD (T), CFX (T), OM1 99%, RCA (T), LIMA-LAD OK, SVG-OM(?3) OK w/ little florw to OM2, SVG-RCA OK. EF NL  . CARPAL TUNNEL RELEASE    . CHEST TUBE INSERTION Right 06/26/2019   Procedure: INSERTION PLEURAL DRAINAGE CATHETER;  Surgeon: Bartle, Bryan K, MD;  Location: [facility] OR;  Service: Thoracic;  Laterality: Right;  . CHOLECYSTECTOMY N/A 07/07/2018   Procedure: LAPAROSCOPIC CHOLECYSTECTOMY WITH INTRAOPERATIVE CHOLANGIOGRAM ERAS PATHWAY;  Surgeon: Newman, David, MD;  Location: [facility];  Service: General;  Laterality: N/A;  . COLONOSCOPY     around 2015. Possibly with [facility]   . CORONARY ARTERY BYPASS GRAFT  1998 &  re-do 2009   Had LIMA to DX/LAD, SVG to 2 marginal branches and SVG to RC originally; SVG to 3rd OM and PD at time of redo  . CYSTOSCOPY W/ URETERAL STENT PLACEMENT Right 12/20/2014   Procedure: CYSTOSCOPY WITH RETROGRADE PYELOGRAM/URETERAL STENT PLACEMENT;  Surgeon: Patrick L McKenzie, MD;  Location: [facility];  Service: Urology;  Laterality: Right;  . CYSTOSCOPY WITH RETROGRADE PYELOGRAM, URETEROSCOPY AND STENT PLACEMENT Left 08/21/2019   Procedure: CYSTOSCOPY WITH RETROGRADE PYELOGRAM, URETEROSCOPY AND STENT PLACEMENT;  Surgeon: McKenzie, Patrick L, MD;  Location: [facility];  Service: Urology;  Laterality: Left;  1 HR  . ESOPHAGOGASTRODUODENOSCOPY     many years ago per patient   . ESOPHAGOGASTRODUODENOSCOPY ENDOSCOPY  06/17/2018  . EYE SURGERY Bilateral    cataracts  . HOLMIUM LASER APPLICATION Right 12/20/2014   Procedure:  HOLMIUM LASER LITHOTRIPSY;  Surgeon: Patrick L McKenzie, MD;  Location: [facility];  Service: Urology;  Laterality: Right;  . HOLMIUM LASER APPLICATION Left 08/21/2019   Procedure: HOLMIUM LASER APPLICATION;  Surgeon: McKenzie, Patrick L, MD;  [facility]: [facility];  Service: Urology;  Laterality: Left;  . IR KYPHO LUMBAR INC FX REDUCE BONE BX UNI/BIL CANNULATION INC/IMAGING  02/12/2020  . IR THORACENTESIS ASP PLEURAL SPACE W/IMG GUIDE  10/03/2018  . IR THORACENTESIS ASP PLEURAL SPACE W/IMG GUIDE  06/08/2019  . LEFT HEART CATHETERIZATION WITH CORONARY/GRAFT ANGIOGRAM N/A 02/23/2013   Procedure: LEFT HEART CATHETERIZATION WITH CORONARY/GRAFT ANGIOGRAM;  Surgeon: Michael D Cooper, MD;  [facility]: [facility];  Service: Cardiovascular;  Laterality: N/A;  . LOOP RECORDER INSERTION N/A 08/30/2017   Procedure: LOOP RECORDER INSERTION;  Surgeon: Taylor, Gregg W, MD;  [facility];  Service: Cardiovascular;  Laterality: N/A;  . PORTACATH PLACEMENT Left 01/18/2016   Procedure: INSERTION PORT-A-CATH;  Surgeon: David Newman, MD;  [facility];  Service: General;  Laterality: Left;  . portacath removal    . REMOVAL OF PLEURAL DRAINAGE CATHETER Right 07/14/2019   Procedure: REMOVAL OF PLEURAL DRAINAGE CATHETER;  Surgeon: Bartle, Bryan K, MD;  [facility];  Service: Thoracic;  Laterality: Right;  . TALC PLEURODESIS N/A 06/26/2019   Procedure: TALC  PLEURADESIS;  Surgeon: Bartle, Bryan K, MD;  [facility];  Service: Thoracic;  Laterality: N/A;  . TONSILLECTOMY    . TRANSTHORACIC ECHOCARDIOGRAM  02-24-2013      mild LVH,  ef 50-55%/  AV bioprosthesis was present with very mild stenosis and no regurg., mean grandient 10mmHg, peak grandient 20mmHg /  mild MR/  mild LAE and RAE/  moderate TR  . TUBAL LIGATION       Medications: Current Meds  Medication Sig  .   ACCU-CHEK GUIDE test strip Use to test blood sugar 3 times daily  . acetaminophen (TYLENOL) 500 MG tablet Take 1,000 mg by mouth every 6 (six) hours as needed for moderate pain.  . albuterol (VENTOLIN HFA) 108 (90 Base) MCG/ACT inhaler Inhale 2 puffs into the lungs every 6 (six) hours as needed for wheezing or shortness of breath.  . amiodarone (PACERONE) 200 MG tablet TAKE ONE TABLET BY MOUTH DAILY  . apixaban (ELIQUIS) 2.5 MG TABS tablet Take 1 tablet (2.5 mg total) by mouth 2 (two) times daily.  . Blood Glucose Monitoring Suppl (ACCU-CHEK GUIDE ME) w/Device KIT Use accu chek meter to check blood sugar three times daily.  . budesonide-formoterol (SYMBICORT) 160-4.5 MCG/ACT inhaler Inhale 2 puffs into the lungs 2 (two) times daily.  . Cholecalciferol (DIALYVITE VITAMIN D 5000) 125 MCG (5000 UT) capsule Take 5,000 Units by mouth daily.  . Dulaglutide (TRULICITY) 1.5 MG/0.5ML SOPN Inject 1.5 mg into the skin once a week.  . exemestane (AROMASIN) 25 MG tablet TAKE ONE TABLET BY MOUTH DAILY AFTER BREAKFAST  . ezetimibe (ZETIA) 10 MG tablet TAKE ONE TABLET BY MOUTH EVERY DAY  . famotidine (PEPCID) 20 MG tablet Take 20 mg by mouth 2 (two) times daily.  . furosemide (LASIX) 20 MG tablet Take 20 mg by mouth daily as needed for fluid.   . insulin glargine (LANTUS) 100 UNIT/ML injection Inject 20 Units into the skin daily.   . Insulin Lispro (HUMALOG KWIKPEN [place]) Inject 6-8 Units into the skin 3 (three) times daily.   . Insulin Pen Needle (BD PEN NEEDLE NANO U/F) 32G X 4 MM MISC USE AS  INSTRUCTED TO INJECT INSULIN 4 TIMES DAILY.  . loratadine (CLARITIN) 10 MG tablet Take 10 mg by mouth daily.   . metoprolol tartrate (LOPRESSOR) 25 MG tablet Take 1 tablet (25 mg total) by mouth 2 (two) times daily. Extra dose if needed for palpitations.  . midodrine (PROAMATINE) 2.5 MG tablet Take 2.5 mg by mouth 2 (two) times daily.   . nitroGLYCERIN (NITROSTAT) 0.4 MG SL tablet Place 1 tablet (0.4 mg total) under the tongue every 5 (five) minutes as needed for chest pain.  . ondansetron (ZOFRAN ODT) 4 MG disintegrating tablet Take 1 tablet (4 mg total) by mouth every 8 (eight) hours as needed.  . rosuvastatin (CRESTOR) 40 MG tablet Take 1 tablet (40 mg total) by mouth at bedtime.  . traMADol (ULTRAM) 50 MG tablet Take 1 tablet (50 mg total) by mouth every 6 (six) hours as needed.  . zolpidem (AMBIEN) 5 MG tablet Take 5 mg by mouth at bedtime.   . [DISCONTINUED] metoprolol tartrate (LOPRESSOR) 25 MG tablet Take 1 tablet (25 mg total) by mouth 2 (two) times daily. Extra dose if needed for palpitations.     Allergies: Allergies  Allergen Reactions  . Amoxicillin Rash and Other (See Comments)    Tolerates Cephalosporins Has patient had a PCN reaction causing immediate rash, facial/tongue/throat swelling, SOB or lightheadedness with hypotension: Yes Has patient had a PCN reaction causing severe rash involving mucus membranes or skin necrosis: Yes Has patient had a PCN reaction that required hospitalization No Has patient had a PCN reaction occurring within the last 10 years: No If all of the above answers are "NO", then may proceed with Cephalosporin use.   . Tape Other (See Comments)    Pulls off skin, must use paper tape  . Aldactone [Spironolactone] Other (See Comments)    CKD/hypokalemia  . Arimidex [Anastrozole] Nausea   Only  . Latex Itching and Other (See Comments)    (Dentist office)  . Tetracycline Rash    Social History: The patient  reports that she has never smoked. She has  never used smokeless tobacco. She reports that she does not drink alcohol and does not use drugs.   Family History: The patient's family history includes Diabetes in her maternal grandmother and son; Healthy in her brother; Heart attack in her brother; Heart disease in her brother, father, and maternal grandmother; Heart failure in her father.   Review of Systems: Please see the history of present illness.   All other systems are reviewed and negative.   Physical Exam: VS:  BP (!) 108/58   Pulse 65   Ht 5' 4" (1.626 m)   Wt 133 lb 3.2 oz (60.4 kg)   LMP  (LMP Unknown)   SpO2 99%   BMI 22.86 kg/m  .  BMI Body mass index is 22.86 kg/m.  Wt Readings from Last 3 Encounters:  02/23/20 133 lb 3.2 oz (60.4 kg)  02/12/20 141 lb (64 kg)  02/04/20 140 lb 4.8 oz (63.6 kg)    General: Alert. She is chronically ill appearing. No acute distress. Down 4 more pounds.  Cardiac: Regular rate and rhythm. Harsh outflow murmur. No edema.  Respiratory:  Lungs are clear to auscultation bilaterally with normal work of breathing.  GI: Soft and nontender.  MS: No deformity or atrophy. Gait not tested. She is in a wheelchair today.  Skin: Warm and dry. Color is sallow.  Neuro:  Strength and sensation are intact and no gross focal deficits noted.  Psych: Alert, appropriate and with normal affect.   LABORATORY DATA:  EKG:  EKG is not ordered today.    Lab Results  Component Value Date   WBC 6.4 02/12/2020   HGB 8.5 (L) 02/12/2020   HCT 25.0 (L) 02/12/2020   PLT 97 (L) 02/12/2020   GLUCOSE 114 (H) 02/12/2020   CHOL 106 12/02/2018   TRIG 97 12/02/2018   HDL 34 (L) 12/02/2018   LDLDIRECT 53.0 10/08/2016   LDLCALC 53 12/02/2018   ALT 43 02/04/2020   AST 54 (H) 02/04/2020   NA 136 02/12/2020   K 5.0 02/12/2020   CL 105 02/12/2020   CREATININE 2.67 (H) 02/12/2020   BUN 34 (H) 02/12/2020   CO2 21 (L) 02/12/2020   TSH 3.090 08/03/2019   INR 1.2 02/12/2020   HGBA1C 6.1 (H) 08/13/2019    MICROALBUR 14.2 (H) 07/02/2019     BNP (last 3 results) Recent Labs    03/04/19 1429  BNP 273.4*    ProBNP (last 3 results) No results for input(s): PROBNP in the last 8760 hours.   Other Studies Reviewed Today:  ECHO IMPRESSIONS 05/2019  1. Left ventricular ejection fraction, by visual estimation, is 60 to 65%. The left ventricle has normal function. Left ventricular septal wall thickness was normal. Normal left ventricular posterior wall thickness. There is no left ventricular  hypertrophy.  2. Elevated left ventricular end-diastolic pressure.  3. Left ventricular diastolic parameters are consistent with Grade II diastolic dysfunction (pseudonormalization).  4. Global right ventricle has normal systolic function.The right ventricular size is normal. No increase in right ventricular wall thickness.  5. Left atrial size was severely dilated.  6. Right atrial size was normal.  7. Mild mitral annular calcification.  8. Mild thickening of the anterior and posterior mitral valve leaflet(s).  9. The mitral valve is normal in structure. Mild mitral   valve regurgitation. No evidence of mitral stenosis.  10. The tricuspid valve is normal in structure.  11. Aortic valve regurgitation is not visualized. No evidence of aortic valve sclerosis or stenosis.  12. The pulmonic valve was normal in structure. Pulmonic valve regurgitation is trivial.  13. Normal pulmonary artery systolic pressure.  14. The inferior vena cava is normal in size with greater than 50% respiratory variability, suggesting right atrial pressure of 3 mmHg.    CT CHEST IMPRESSION 04/2019:  Moderate right pleural effusion, increased from prior CT. Associated  right lower lobe atelectasis.  No evidence of pneumonia.  1.2 x 2.4 cm lesion along the inferior aspect of the right  breast/chest wall. While additional findings noted above correlate  with areas of fat necrosis, this lesion was not clearly identified  when  correlating with prior breast tomography report. Breast imaging  consultation is suggested for further evaluation, including  potential ultrasound with biopsy versus breast MR. These results  will be called to the ordering clinician or representative by the  Radiologist Assistant, and communication documented in the PACS or  zVision Dashboard.  Aortic Atherosclerosis (ICD10-I70.0).  Electronically Signed  By: Julian Hy M.D.  On: 05/06/2019 18:11    CT ABDOMEN IMPRESSION 03/2019: Chest Impression:  1. Mild RIGHT basilar atelectasis and small RIGHT effusion.  2. Increased skin thickening in the lateral RIGHT breast. Recommend  clinical correlation.  3. Stable seroma in the RIGHT breast.   Abdomen / Pelvis Impression:  1. No acute findings in the abdomen pelvis.  2. Nonobstructing calculi in the LEFT renal pelvis.  3. Degenerative endplate change in the lumbar spine similar to  comparison CT 2019.  4. Coronary artery calcification and Aortic Atherosclerosis  (ICD10-I70.0).  Electronically Signed  By: Suzy Bouchard M.D.  On: 03/17/2019 09:59   ABD [place] 02/17/2019  Other findings: There is a somewhat lobulated fluid collection  identified along the inferior aspect of the liver. This measures 2.6  x 3.4 x 4.3 cm. This was not seen on the prior CT examination and  may be related to a focal fluid collection from prior  cholecystectomy. Mild perihepatic ascites is noted.  Note is made of right-sided pleural effusion.  IMPRESSION:  Status post cholecystectomy. There is a lobulated cystic area as  described above along the inferior right hepatic margin. This could  be related to the prior surgery possibly representing a small  seroma. CT may be helpful for further evaluation.  Small right pleural effusion.  Mild ascites.  Increased echogenicity within the liver likely related to fatty  infiltration or underlying hepatocellular disease.  Electronically Signed  By: Inez Catalina M.D.  On: 02/17/2019 10:18    Breast Ultrasound 01/2019  IMPRESSION:  1. New irregular hypoechoic mass along the posterior margin of the  scar right breast 8 o'clock position. Findings are nonspecific  however concerning for the possibility of localized recurrence.  2. Within the outer aspect of the right axilla there are two  adjacent irregular hypoechoic masses just deep to the skin which are  nonspecific. These may represent focal fat necrosis or potentially  recurrent or metastatic disease.  RECOMMENDATION:  1. Ultrasound-guided core needle biopsy right breast mass 8 o'clock  position just deep to the scar.  2. Ultrasound-guided core needle biopsy of both irregular hypoechoic  masses within the outer aspect of the right axilla.    MYOVIEW FINDINGS FROM 06/2013:  Normal resting EKG. Slight ST depressions noted in aVL after  administration of lexiscan.  Mild shortness of breath and nausea  resolved after the test. EKG is nondiagnostic for ischemia. TID  ratio 1.05. Lung-heart ratio 0.43. Normal ventricular chamber size.  IMPRESSION:  No evidence for ischemia. Normal wall motion. LVEF 72%.  Electronically Signed  By: Ken Hilty    Cardiac Cath: 02/23/2013  Left mainstem: Normal  Left anterior descending (LAD): Severe proximal and mid calcification with long proximal 95% stenosis. The mid and distal vessel is small but free of high grade disease.  Left circumflex (LCx): AV groove has a mid 90% stenosis before a moderate sized MOM. OM1 and OM2 are occluded at the ostium and fill via the SVG. OM3 occluded at the ostium and fills via SVG. The grafted OMs are small and diffusely disease.  Right coronary artery (RCA): Occluded in the mid vessel. The PDA is moderate sized and occluded at the ostium. There is a 70% stenosis in the PDA after the insertion of the vein graft.  Grafts:  LIMA to LAD: Patent  SVG to RCA: Occluded (from original CABG)  SVG to RCA: Patent (from  redo CABG). This is mild diffuse plaque within the vein graft.  SVG to OM3: Patent. This is mild diffuse plaque within the vein graft.  SVG sequential to OM1/OM2: Patent. There is ostial 50% stenosis. There is a patent proximal SVG stent. The native marginals are very small.  Left ventriculography: LV not injected. AVR not crossed.  Final Conclusions: Severe native vessel CAD. Patent grafts as described with nonobstructive disease in the grafts. A stent placed into the sequential SVG is patent with some disease at the ostium that on several views in not occlusive. I did compare the 2009 cath with results today. There is high grade disease in the circumflex AV groove. However, this is not changed from 2009. However, this lesion does appear to lead into a non grafted OM. If she has further symptoms I would consider PCI of the native circumflex.     Assessment/Plan:  1. Chronic diastolic HF - weight continues to trend down. No recent use of lasix. Little worrisome.   2. CAD - prior CABG and then redo CABG in 2009 - last cath from 2014 - has been managed medically since then. Has not been able to tolerate Imdur unfortunately - chest pain seems reasonably stable but still worrisome as well.   3. CKD - has upcoming visit.   4. Profound anemia - still concerning - unsure if she needs to get back to GI. Never had her colonoscopy last year due to the fall in our parking lot and subsequent concussion/hematoma.   5. Recent UTI  6. Prior kidney stones - apparently does have another one - being followed.   7. Chronic orthostatic hypotension - on Midodrine.   8. Carotid disease - recent study stable.   9. PAF - in sinus - on amiodarone - needs labs today for surveillance.   10. Prior breast cancer - for studies tomorrow.    11. Chronic anticoagulation - still worrisome with the ongoing anemia - will ask Hematology if they feels she needs to go back to GI.   12. Health maintenance - has not  been vaccinated for COVID 19 - this is worrisome as well.   Current medicines are reviewed with the patient today.  The patient does not have concerns regarding medicines other than what has been noted above.  The following changes have been made:  See above.  Labs/ tests ordered today include:      Orders Placed This Encounter  Procedures  . Basic metabolic panel  . CBC  . Hepatic function panel  . TSH  . Lipid panel     Disposition:   FU with me in 6 weeks.    Patient is agreeable to this plan and will call if any problems develop in the interim.   Signed: [name] GERHARDT, NP  02/23/2020 2:44 PM  [facility] [address] [place], [place]  [address] Phone: [phone] Fax: [phone]        

## 2020-02-09 NOTE — Telephone Encounter (Signed)
Patient with diagnosis of atrial fibrillation on Eliquis for anticoagulation.    Procedure: kyphoplasty Date of procedure: 02/12/20  CHADS2-VASc score of  6 (CHF, HTN, AGE, DM2, CAD, female)  CrCl 18 Platelet count 118  Per office protocol, patient can hold Eliquis for 3 days prior to procedure.    If not bridging, patient should restart Eliquis on the evening of procedure or day after, at discretion of procedure MD  BASED ON RENAL FUNCTION, (CrCl 18 and not on dialysis) PT SHOULD BE ON 2.5 MG DOSE.  Called patient to inform her to stop Eliquis now for procedure, and when she re-starts after, should be on the 2.5 mg dose.  Patient voiced understanding.  New Rx sent to pharmacy.

## 2020-02-09 NOTE — Telephone Encounter (Signed)
CrCl 18

## 2020-02-09 NOTE — Telephone Encounter (Signed)
   Primary Cardiologist: Truitt Merle, NP  Chart reviewed as part of pre-operative protocol coverage. Patient was contacted 02/09/2020 in reference to pre-operative risk assessment for pending surgery as outlined below.  Hannah [name] was last seen 01/19/2020 by Truitt Merle, NP. Complex history as well-outlined. I spoke with Hannah [name] who knows the patient extremely well. She feels that Hannah [name] is at increased risk for having surgery but given potential clinical benefit for pain control, she is OK to proceed with planned procedure without further cardiovascular testing. The patient was advised that if she develops new symptoms prior to surgery to contact our office to arrange for a follow-up visit, and she verbalized understanding.  Our pharmacist provided the following recommendations:  "Per office protocol, patient can hold Eliquis for 3 days prior to procedure.    If not bridging, patient should restart Eliquis on the evening of procedure or day after, at discretion of procedure MD  BASED ON RENAL FUNCTION, (CrCl 18 and not on dialysis) PT SHOULD BE ON 2.5 MG DOSE.  Called patient to inform her to stop Eliquis now for procedure, and when she re-starts after, should be on the 2.5 mg dose.  Patient voiced understanding.  New Rx sent to pharmacy."  The patient had sent a Mychart message to Hannah [name] to clarify the blood thinner recommendation. Hannah [name] agrees with our pharmD's recommendations so I relayed that to the patient along with Hannah [name]'s input above (no Mychart message reply needed).  I will route this recommendation to the requesting party via Epic fax function and remove from pre-op pool. Please call with questions.  Charlie Pitter, PA-C 02/09/2020, 10:35 AM

## 2020-02-10 NOTE — Progress Notes (Signed)
[facility] Summary Report / Loop Recorder 

## 2020-02-11 ENCOUNTER — Ambulatory Visit: Payer: Medicare Other

## 2020-02-11 ENCOUNTER — Ambulatory Visit: Payer: Medicare Other | Admitting: Adult Health

## 2020-02-11 ENCOUNTER — Other Ambulatory Visit: Payer: Medicare Other

## 2020-02-11 ENCOUNTER — Other Ambulatory Visit: Payer: Self-pay | Admitting: Physician Assistant

## 2020-02-12 ENCOUNTER — Inpatient Hospital Stay: Payer: Medicare Other

## 2020-02-12 ENCOUNTER — Other Ambulatory Visit: Payer: Self-pay

## 2020-02-12 ENCOUNTER — Inpatient Hospital Stay: Payer: Medicare Other | Admitting: Hematology and Oncology

## 2020-02-12 ENCOUNTER — Ambulatory Visit (HOSPITAL_COMMUNITY)
Admission: RE | Admit: 2020-02-12 | Discharge: 2020-02-12 | Disposition: A | Discharge status: Home / Self Care | Payer: Medicare Other | Source: Ambulatory Visit | Attending: Interventional Radiology | Admitting: Interventional Radiology

## 2020-02-12 DIAGNOSIS — Z794 Long term (current) use of insulin: Secondary | ICD-10-CM | POA: Insufficient documentation

## 2020-02-12 DIAGNOSIS — W19XXXS Unspecified fall, sequela: Secondary | ICD-10-CM | POA: Diagnosis not present

## 2020-02-12 DIAGNOSIS — I480 Paroxysmal atrial fibrillation: Secondary | ICD-10-CM | POA: Diagnosis not present

## 2020-02-12 DIAGNOSIS — Z853 Personal history of malignant neoplasm of breast: Secondary | ICD-10-CM | POA: Insufficient documentation

## 2020-02-12 DIAGNOSIS — I252 Old myocardial infarction: Secondary | ICD-10-CM | POA: Diagnosis not present

## 2020-02-12 DIAGNOSIS — E785 Hyperlipidemia, unspecified: Secondary | ICD-10-CM | POA: Diagnosis not present

## 2020-02-12 DIAGNOSIS — S32000S Wedge compression fracture of unspecified lumbar vertebra, sequela: Secondary | ICD-10-CM | POA: Insufficient documentation

## 2020-02-12 DIAGNOSIS — N184 Chronic kidney disease, stage 4 (severe): Secondary | ICD-10-CM | POA: Insufficient documentation

## 2020-02-12 DIAGNOSIS — Z888 Allergy status to other drugs, medicaments and biological substances status: Secondary | ICD-10-CM | POA: Insufficient documentation

## 2020-02-12 DIAGNOSIS — I2510 Atherosclerotic heart disease of native coronary artery without angina pectoris: Secondary | ICD-10-CM | POA: Diagnosis not present

## 2020-02-12 DIAGNOSIS — J45909 Unspecified asthma, uncomplicated: Secondary | ICD-10-CM | POA: Insufficient documentation

## 2020-02-12 DIAGNOSIS — Z7901 Long term (current) use of anticoagulants: Secondary | ICD-10-CM | POA: Insufficient documentation

## 2020-02-12 DIAGNOSIS — I5032 Chronic diastolic (congestive) heart failure: Secondary | ICD-10-CM | POA: Insufficient documentation

## 2020-02-12 DIAGNOSIS — D509 Iron deficiency anemia, unspecified: Secondary | ICD-10-CM | POA: Diagnosis not present

## 2020-02-12 DIAGNOSIS — Z951 Presence of aortocoronary bypass graft: Secondary | ICD-10-CM | POA: Diagnosis not present

## 2020-02-12 DIAGNOSIS — K219 Gastro-esophageal reflux disease without esophagitis: Secondary | ICD-10-CM | POA: Insufficient documentation

## 2020-02-12 DIAGNOSIS — I6523 Occlusion and stenosis of bilateral carotid arteries: Secondary | ICD-10-CM | POA: Insufficient documentation

## 2020-02-12 DIAGNOSIS — Z952 Presence of prosthetic heart valve: Secondary | ICD-10-CM | POA: Insufficient documentation

## 2020-02-12 DIAGNOSIS — E1122 Type 2 diabetes mellitus with diabetic chronic kidney disease: Secondary | ICD-10-CM | POA: Insufficient documentation

## 2020-02-12 DIAGNOSIS — Z881 Allergy status to other antibiotic agents status: Secondary | ICD-10-CM | POA: Diagnosis not present

## 2020-02-12 DIAGNOSIS — Z8673 Personal history of transient ischemic attack (TIA), and cerebral infarction without residual deficits: Secondary | ICD-10-CM | POA: Insufficient documentation

## 2020-02-12 DIAGNOSIS — Z79899 Other long term (current) drug therapy: Secondary | ICD-10-CM | POA: Diagnosis not present

## 2020-02-12 DIAGNOSIS — Z923 Personal history of irradiation: Secondary | ICD-10-CM | POA: Diagnosis not present

## 2020-02-12 DIAGNOSIS — Z9221 Personal history of antineoplastic chemotherapy: Secondary | ICD-10-CM | POA: Insufficient documentation

## 2020-02-12 DIAGNOSIS — S32010A Wedge compression fracture of first lumbar vertebra, initial encounter for closed fracture: Secondary | ICD-10-CM

## 2020-02-12 DIAGNOSIS — Z91040 Latex allergy status: Secondary | ICD-10-CM | POA: Insufficient documentation

## 2020-02-12 DIAGNOSIS — Z7951 Long term (current) use of inhaled steroids: Secondary | ICD-10-CM | POA: Insufficient documentation

## 2020-02-12 HISTORY — PX: IR KYPHO LUMBAR INC FX REDUCE BONE BX UNI/BIL CANNULATION INC/IMAGING: IMG5519

## 2020-02-12 LAB — GLUCOSE, CAPILLARY
Glucose-Capillary: 81 mg/dL (ref 70–99)
Glucose-Capillary: 118 mg/dL — ABNORMAL HIGH (ref 70–99)

## 2020-02-12 LAB — CBC
HCT: 36 % (ref 36.0–46.0)
Hemoglobin: 10.7 g/dL — ABNORMAL LOW (ref 12.0–15.0)
MCH: 30.4 pg (ref 26.0–34.0)
MCHC: 29.7 g/dL — ABNORMAL LOW (ref 30.0–36.0)
MCV: 102.3 fL — ABNORMAL HIGH (ref 80.0–100.0)
Platelets: 97 10*3/uL — ABNORMAL LOW (ref 150–400)
RBC: 3.52 MIL/uL — ABNORMAL LOW (ref 3.87–5.11)
RDW: 17.1 % — ABNORMAL HIGH (ref 11.5–15.5)
WBC: 6.4 10*3/uL (ref 4.0–10.5)
nRBC: 0 % (ref 0.0–0.2)

## 2020-02-12 LAB — BASIC METABOLIC PANEL
Anion gap: 10 (ref 5–15)
BUN: 34 mg/dL — ABNORMAL HIGH (ref 8–23)
CO2: 21 mmol/L — ABNORMAL LOW (ref 22–32)
Calcium: 9 mg/dL (ref 8.9–10.3)
Chloride: 105 mmol/L (ref 98–111)
Creatinine, Ser: 2.67 mg/dL — ABNORMAL HIGH (ref 0.44–1.00)
GFR calc Af Amer: 20 mL/min — ABNORMAL LOW (ref >60)
GFR calc non Af Amer: 17 mL/min — ABNORMAL LOW (ref >60)
Glucose, Bld: 114 mg/dL — ABNORMAL HIGH (ref 70–99)
Potassium: 5 mmol/L (ref 3.5–5.1)
Sodium: 136 mmol/L (ref 135–145)

## 2020-02-12 LAB — URINALYSIS, COMPLETE (UACMP) WITH MICROSCOPIC
Bilirubin Urine: NEGATIVE
Glucose, UA: 50 mg/dL — AB
Ketones, ur: NEGATIVE mg/dL
Nitrite: NEGATIVE
Protein, ur: 30 mg/dL — AB
Specific Gravity, Urine: 1.016 (ref 1.005–1.030)
WBC, UA: >50 WBC/hpf — ABNORMAL HIGH (ref 0–5)
pH: 5 (ref 5.0–8.0)

## 2020-02-12 LAB — PROTIME-INR
INR: 1.2 (ref 0.8–1.2)
Prothrombin Time: 14.3 seconds (ref 11.4–15.2)

## 2020-02-12 MED ORDER — BUPIVACAINE HCL (PF) 0.5 % IJ SOLN
INTRAMUSCULAR | Status: AC | PRN
  Administered 2020-02-12: 20 mL

## 2020-02-12 MED ORDER — MIDAZOLAM HCL 2 MG/2ML IJ SOLN
INTRAMUSCULAR | Status: AC
  Filled 2020-02-12: qty 2

## 2020-02-12 MED ORDER — IOHEXOL 300 MG/ML  SOLN: 50.0000 mL | Freq: Once | INTRAMUSCULAR | Status: DC | PRN

## 2020-02-12 MED ORDER — ACETAMINOPHEN 325 MG PO TABS: 650.0000 mg | ORAL_TABLET | Freq: Once | ORAL | Status: AC

## 2020-02-12 MED ORDER — FENTANYL CITRATE (PF) 100 MCG/2ML IJ SOLN
INTRAMUSCULAR | Status: AC
  Filled 2020-02-12: qty 2

## 2020-02-12 MED ORDER — MIDAZOLAM HCL 2 MG/2ML IJ SOLN
INTRAMUSCULAR | Status: AC | PRN
  Administered 2020-02-12 (×2): 1 mg via INTRAVENOUS

## 2020-02-12 MED ORDER — ACETAMINOPHEN 325 MG PO TABS
ORAL_TABLET | ORAL | Status: AC
  Administered 2020-02-12: 650 mg via ORAL
  Filled 2020-02-12: qty 2

## 2020-02-12 MED ORDER — TOBRAMYCIN SULFATE 1.2 G IJ SOLR
INTRAMUSCULAR | Status: AC
  Filled 2020-02-12: qty 1.2

## 2020-02-12 MED ORDER — FENTANYL CITRATE (PF) 100 MCG/2ML IJ SOLN
INTRAMUSCULAR | Status: AC | PRN
  Administered 2020-02-12 (×2): 25 ug via INTRAVENOUS

## 2020-02-12 MED ORDER — VANCOMYCIN HCL IN DEXTROSE 1-5 GM/200ML-% IV SOLN
INTRAVENOUS | Status: AC
  Administered 2020-02-12: 1000 mg
  Filled 2020-02-12: qty 200

## 2020-02-12 MED ORDER — BUPIVACAINE HCL (PF) 0.5 % IJ SOLN
INTRAMUSCULAR | Status: AC
  Filled 2020-02-12: qty 30

## 2020-02-12 MED ORDER — SODIUM CHLORIDE 0.9 % IV SOLN: INTRAVENOUS | Status: DC

## 2020-02-12 MED ORDER — VANCOMYCIN HCL IN DEXTROSE 1-5 GM/200ML-% IV SOLN: 1000.0000 mg | Freq: Once | INTRAVENOUS | Status: DC

## 2020-02-12 MED ORDER — SODIUM CHLORIDE 0.9 % IV SOLN: INTRAVENOUS | Status: AC

## 2020-02-12 NOTE — Discharge Instructions (Signed)
1. No stooping,bending  or lifting weights more than 10 lbs for 2 weeks. 2.Use walker to ambulate for 2 weeks. 3.No driving for 2 weeks. 4. Check with orthopedic surgeon for biopsy results. 5 RTC PRN  KYPHOPLASTY/VERTEBROPLASTY DISCHARGE INSTRUCTIONS  Medications: (check all that apply)     Resume all home medications as before procedure.       Resume your (aspirin/Plavix/Coumadin) on .                  Continue your pain medications as prescribed as needed.  Over the next 3-5 days, decrease your pain medication as tolerated.  Over the counter medications (i.e. Tylenol, ibuprofen, and aleve) may be substituted once severe/moderate pain symptoms have subsided.   Wound Care: - Bandages may be removed the day following your procedure.  You may get your incision wet once bandages are removed.  Bandaids may be used to cover the incisions until scab formation.  Topical ointments are optional.  - If you develop a fever greater than 101 degrees, have increased skin redness at the incision sites or pus-like oozing from incisions occurring within 1 week of the procedure, contact radiology at [phone] or [phone].  - Ice pack to back for 15-20 minutes 2-3 time per day for first 2-3 days post procedure.  The ice will expedite muscle healing and help with the pain from the incisions.   Activity: - Bedrest today with limited activity for 24 hours post procedure.  - No driving for 48 hours.  - Increase your activity as tolerated after bedrest (with assistance if necessary).  - Refrain from any strenuous activity or heavy lifting (greater than 10 lbs.).   Follow up: - Contact radiology at [phone] or [phone] if any questions/concerns.  - A physician assistant from radiology will contact you in approximately 1 week.  - If a biopsy was performed at the time of your procedure, your referring physician should receive the results in usually 2-3 days.

## 2020-02-12 NOTE — H&P (Signed)
Chief Complaint: Patient was seen in consultation today for L1 compression fracture/vertebral augmentation.  Referring Physician(s): Garald Balding (orthopedic surgery)  Supervising Physician: Luanne Bras  Patient Status: [facility] - Out-pt  History of Present Illness: Hannah [name] is a 74 y.o. female with an extensive past medical history including CAD s/p CABG 1998 and 2009, paroxysmal atrial fibrillation with chronic anticoagulation with Eliquis, MI 2014, HF, CVA 2014, asthma, diabetes mellitus type II, breast cancer, and osteopenia. On 01/19/2020, patient experienced a mechanical fall from standing position. She hit her head and back. Following this, she developed low back pain. She presented to [facility] 01/20/2020 for further evaluation. Xrays of lumbar spine were obtained which revealed an age indeterminate L1 compression fracture. She was treated conservatively and was referred to orthopedics on an outpatient basis. Orthopedics obtained MR lumbar spine which revealed L1 compression fracture to be acute/subacute.  MR lumbar spine 01/28/2020: 1. Acute or subacute compression fracture at L1 with loss of height of 25%. No retropulsed bone. No finding to suggest that this represents anything other than a benign osteoporotic fracture. 2. Superior endplate edema on the left at L4 that could possibly represent a minor superior endplate fracture in this location, or could be discogenic. 3. Old superior and inferior endplate Schmorl's nodes at L3. Some edema associated with the inferior Schmorl's node, felt not to relate to an acute injury. 4. L2-3: Broad-based disc herniation. Moderate stenosis that could possibly cause neural compression. This was probably present at the time of the CT study in May of 2021. 5. L3-4: Mild multifactorial stenosis. Potential for neural compression in the lateral recesses. 6. L4-5: Bilateral lateral recess stenosis and intervertebral foraminal  stenosis on the right that could cause neural compression.  NIR consulted by Dr. Durward Fortes for possible image-guided L1 kyphoplasty/vertebroplasty. Patient awake and alert sitting in bed. Complains of low back pain, rated 2-3/10 when laying flat. States any movement exacerbates pain, rated 8-9/10 at its worst. Denies fever, chills, chest pain, dyspnea, abdominal pain, urinary symptoms (dyspnea, frequency, hematuria), or headache.  LD Eliquis Sunday 02/07/2020.   Past Medical History:  Diagnosis Date  . Abnormally small mouth   . Allergic rhinitis 10/14/2009   Qualifier: Diagnosis of  By: Lamonte Sakai MD, Rose Fillers   Overview:  Overview:  Qualifier: Diagnosis of  By: Lamonte Sakai MD, Rose Fillers  Last Assessment & Plan:  Please continue Xyzal and Nasacort as you have been using them  . Anemia   . Asthma 05/12/2009   10/12/2014 p extensive coaching HFA effectiveness =    75% s spacer    Overview:  Overview:  10/12/2014 p extensive coaching HFA effectiveness =    75% s spacer   Last Assessment & Plan:  Please continue Symbicort 2 puffs twice a day. Remember to rinse and gargle after taking this medication. Take albuterol 2 puffs up to every 4 hours if needed for shortness of breath.  Follow with Dr Lamonte Sakai in 6 month  . Bilateral carotid artery stenosis    a. mild 1-39% by duplex 10/2017, due 2021.  . Breast cancer ([place]) 01/18/2016   right breast  . CAD (coronary artery disease) 01/24/2011   a. s/p CABG 1998 with redo 2009.  Marland Kitchen Chemotherapy induced nausea and vomiting 02/24/2016  . Chemotherapy-induced peripheral neuropathy ([place]) 04/27/2016  . 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 Chronic diastolic CHF (congestive heart failure) ([place]) 09/24/2013  . CKD (chronic kidney disease), stage IV ([place]) 09/24/2013  . Closed fracture of  head of left humerus 09/09/2017  . Complication of anesthesia   . Dysrhythmia    a-fib  . Gallstones   . GERD (gastroesophageal reflux disease)   . Glaucoma   . H/O atrial  tachycardia 05/12/2009   Qualifier: History of  By: Lamonte Sakai MD, Rose Fillers   Overview:  Overview:  Qualifier: History of  By: Lamonte Sakai MD, Rose Fillers  Last Assessment & Plan:  There was no evidence of this on her cardiac monitor. I would recommend watchful waiting.   Marland Kitchen Heart murmur   . History of kidney stones   . History of non-ST elevation myocardial infarction (NSTEMI)    Sept 2014--  thought to be type II HTN w/ LHC without infarct related artery and patent grafts  . History of radiation therapy 05/24/16-07/26/16   right breast 50.4 Gy in 28 fractions, right breast boost 10 Gy in 5 fractions  . Hyperlipidemia   . Hypotension   . Iron deficiency anemia   . Mania ([place]) 03/05/2016  . Moderate persistent asthma    pulmologist-  Dr. Malvin Johns  . Osteomyelitis of toe of left foot ([place]) 04/06/2016  . Osteopenia of multiple sites 10/19/2015  . PAF (paroxysmal atrial fibrillation) ([place])   . Personal history of chemotherapy 2017  . Personal history of radiation therapy 2017  . PONV (postoperative nausea and vomiting)   . Port catheter in place 02/17/2016  . Psoriasis    right leg  . Renal calculus, right   . S/P AVR    prosthesis valve placement 2009 at same time re-do CABG  . Seizure-like activity ([place]) 09/01/2017  . Sensorineural hearing loss (SNHL) of both ears 01/06/2016  . Stroke [facility]) 2014   residual rt hearing loss  . Syncope 09/06/2017  . Type 2 diabetes mellitus ([place])    monitored by dr Dwyane Dee    Past Surgical History:  Procedure Laterality Date  . AORTIC VALVE REPLACEMENT  2009   [facility]done same time is CABG)  . BREAST LUMPECTOMY Right 01/18/2016  . BREAST LUMPECTOMY WITH RADIOACTIVE SEED AND SENTINEL LYMPH NODE BIOPSY Right 01/18/2016   Procedure: RIGHT BREAST LUMPECTOMY WITH RADIOACTIVE SEED AND SENTINEL LYMPH NODE BIOPSY;  Surgeon: DAlphonsa Overall MD;  [place]  Service: General;  Laterality: Right;  . CARDIAC CATHETERIZATION  03/23/2008   Pre-redo  CABG: L main OK, LAD (T), CFX (T), OM1 99%, RCA (T), LIMA-LAD OK, SVG-OM(?3) OK w/ little florw to OM2, SVG-RCA OK. EF NL  . CARPAL TUNNEL RELEASE    . CHEST TUBE INSERTION Right 06/26/2019   Procedure: INSERTION PLEURAL DRAINAGE CATHETER;  Surgeon: BGaye Pollack MD;  [place]  Service: Thoracic;  Laterality: Right;  . CHOLECYSTECTOMY N/A 07/07/2018   Procedure: LAPAROSCOPIC CHOLECYSTECTOMY WITH INTRAOPERATIVE CHOLANGIOGRAM ERAS PATHWAY;  Surgeon: NAlphonsa Overall MD;  Location[facility];  Service: General;  Laterality: N/A;  . COLONOSCOPY     around 2015. Possibly with [facility]   . CORONARY ARTERY BYPASS GRAFT  1998 &  re-do 2009   Had LIMA to DX/LAD, SVG to 2 marginal branches and [facility]; SVG to 3rd OM and PD at time of redo  . CYSTOSCOPY W/ URETERAL STENT PLACEMENT Right 12/20/2014   Procedure: CYSTOSCOPY WITH RETROGRADE PYELOGRAM/URETERAL STENT PLACEMENT;  Surgeon: PCleon Gustin MD;  [facility]  Service: Urology;  Laterality: Right;  . CYSTOSCOPY WITH RETROGRADE PYELOGRAM, URETEROSCOPY AND STENT PLACEMENT Left 08/21/2019   Procedure: CYSTOSCOPY WITH  RETROGRADE PYELOGRAM, URETEROSCOPY AND STENT PLACEMENT;  Surgeon: Cleon Gustin, MD;  [facility];  Service: Urology;  Laterality: Left;  1 HR  . ESOPHAGOGASTRODUODENOSCOPY     many years ago per patient   . ESOPHAGOGASTRODUODENOSCOPY ENDOSCOPY  06/17/2018  . EYE SURGERY Bilateral    cataracts  . HOLMIUM LASER APPLICATION Right 5/69/7948   Procedure:  HOLMIUM LASER LITHOTRIPSY;  Surgeon: Cleon Gustin, MD;  Location: [facility];  Service: Urology;  Laterality: Right;  . HOLMIUM LASER APPLICATION Left 0/16/5537   Procedure: HOLMIUM LASER APPLICATION;  Surgeon: Cleon Gustin, MD;  [facility];  Service: Urology;  Laterality: Left;  . IR THORACENTESIS ASP PLEURAL SPACE W/IMG GUIDE  10/03/2018  . IR THORACENTESIS ASP PLEURAL SPACE W/IMG GUIDE  06/08/2019  .  LEFT HEART CATHETERIZATION WITH CORONARY/GRAFT ANGIOGRAM N/A 02/23/2013   Procedure: LEFT HEART CATHETERIZATION WITH Beatrix Fetters;  Surgeon: Blane Ohara, MD;  Location: [facility] CATH LAB;  Service: Cardiovascular;  Laterality: N/A;  . LOOP RECORDER INSERTION N/A 08/30/2017   Procedure: LOOP RECORDER INSERTION;  Surgeon: Evans Lance, MD;  Location: [place] CV LAB;  Service: Cardiovascular;  Laterality: N/A;  . PORTACATH PLACEMENT Left 01/18/2016   Procedure: INSERTION PORT-A-CATH;  Surgeon: Alphonsa Overall, MD;  Location: [place];  Service: General;  Laterality: Left;  . portacath removal    . REMOVAL OF PLEURAL DRAINAGE CATHETER Right 07/14/2019   Procedure: REMOVAL OF PLEURAL DRAINAGE CATHETER;  Surgeon: Gaye Pollack, MD;  Location: [place];  Service: Thoracic;  Laterality: Right;  . TALC PLEURODESIS N/A 06/26/2019   Procedure: Pietro Cassis;  Surgeon: Gaye Pollack, MD;  [facility];  Service: Thoracic;  Laterality: N/A;  . TONSILLECTOMY    . TRANSTHORACIC ECHOCARDIOGRAM  02-24-2013      mild LVH,  ef 50-55%/  AV bioprosthesis was present with very mild stenosis and no regurg., mean grandient 68mHg, peak grandient 283mg /  mild MR/  mild LAE and RAE/  moderate TR  . TUBAL LIGATION      Allergies: Amoxicillin, Tape, Aldactone [spironolactone], Arimidex [anastrozole], Latex, and Tetracycline  Medications: Prior to Admission medications   Medication Sig Start Date End Date Taking? Authorizing Provider  acetaminophen (TYLENOL) 500 MG tablet Take 1,000 mg by mouth every 6 (six) hours as needed for moderate pain.   Yes [provider]  albuterol (VENTOLIN HFA) 108 (90 Base) MCG/ACT inhaler Inhale 2 puffs into the lungs every 6 (six) hours as needed for wheezing or shortness of breath. 10/07/19  Yes ByCollene GobbleMD  amiodarone (PACERONE) 200 MG tablet TAKE ONE TABLET BY MOUTH DAILY Patient taking differently: Take 200 mg by mouth daily.  02/02/20  Yes TaEvans LanceMD  budesonide-formoterol ([facility]160-4.5 MCG/ACT inhaler Inhale 2 puffs into the lungs 2 (two) times daily. 10/07/19  Yes ByCollene GobbleMD  Cholecalciferol (DIALYVITE VITAMIN D 5000) 125 MCG (5000 UT) capsule Take 5,000 Units by mouth daily.   Yes [provider]  Dulaglutide (TRULICITY) 1.5 MGSM/2.7MBOPN Inject 1.5 mg into the skin once a week. Patient taking differently: Inject 1.5 mg into the skin every Wednesday.  10/28/19  Yes KuElayne SnareMD  exemestane (AROMASIN) 25 MG tablet TAKE ONE TABLET BY MOUTH DAILY AFTER BREAKFAST Patient taking differently: Take 25 mg by mouth daily after breakfast.  09/23/19  Yes GuNicholas LoseMD  ezetimibe (ZETIA) 10 MG tablet TAKE ONE TABLET BY MOUTH EVERY DAY Patient taking differently:  Take 10 mg by mouth daily.  10/30/19  Yes [name], NP  famotidine (PEPCID) 20 MG tablet Take 20 mg by mouth 2 (two) times daily.   Yes [provider]  furosemide (LASIX) 20 MG tablet Take 20 mg by mouth daily as needed for fluid.    Yes [provider]  insulin glargine (LANTUS) 100 UNIT/ML injection Inject 20 Units into the skin daily.    Yes [provider]  Insulin Lispro (HUMALOG KWIKPEN [place]) Inject 6-8 Units into the skin 3 (three) times daily.    Yes [provider]  loratadine (CLARITIN) 10 MG tablet Take 10 mg by mouth daily.    Yes [provider]  metoprolol tartrate (LOPRESSOR) 25 MG tablet Take 1 tablet (25 mg total) by mouth 2 (two) times daily. Extra dose if needed for palpitations. 12/22/19  Yes [name], NP  midodrine (PROAMATINE) 2.5 MG tablet Take 2.5 mg by mouth 2 (two) times daily.    Yes [provider]  nitroGLYCERIN (NITROSTAT) 0.4 MG SL tablet Place 1 tablet (0.4 mg total) under the tongue every 5 (five) minutes as needed for chest pain. 12/22/19 03/21/20 Yes [name], NP  ondansetron (ZOFRAN ODT) 4 MG disintegrating tablet Take 1 tablet (4 mg total) by mouth every  8 (eight) hours as needed. Patient taking differently: Take 4 mg by mouth every 8 (eight) hours as needed for nausea or vomiting.  01/20/20  Yes Isla Pence, MD  rosuvastatin (CRESTOR) 40 MG tablet Take 1 tablet (40 mg total) by mouth at bedtime. 03/11/19  Yes [name], NP  traMADol (ULTRAM) 50 MG tablet Take 1 tablet (50 mg total) by mouth every 6 (six) hours as needed. Patient taking differently: Take 50 mg by mouth every 6 (six) hours as needed for moderate pain.  01/27/20  Yes Petrarca, Mike Craze, PA-C  zolpidem (AMBIEN) 5 MG tablet Take 5 mg by mouth at bedtime.    Yes [provider]  ACCU-CHEK GUIDE test strip Use to test blood sugar 3 times daily 10/02/19   Elayne Snare, MD  apixaban (ELIQUIS) 2.5 MG TABS tablet Take 1 tablet (2.5 mg total) by mouth 2 (two) times daily. 02/09/20   [name], NP  Blood Glucose Monitoring Suppl (ACCU-CHEK GUIDE ME) w/Device KIT Use accu chek meter to check blood sugar three times daily. 04/10/19   Elayne Snare, MD  Insulin Pen Needle (BD PEN NEEDLE NANO U/F) 32G X 4 MM MISC USE AS INSTRUCTED TO INJECT INSULIN 4 TIMES DAILY. 09/16/19   Elayne Snare, MD  oxyCODONE-acetaminophen (PERCOCET/ROXICET) 5-325 MG tablet Take 1 tablet by mouth every 4 (four) hours as needed for severe pain. Patient not taking: Reported on 02/09/2020 01/20/20   Isla Pence, MD     Family History  Problem Relation Age of Onset  . Heart disease Father   . Heart failure Father   . Diabetes Maternal Grandmother   . Heart disease Maternal Grandmother   . Diabetes Son   . Healthy Brother        #1  . Heart attack Brother        #2  . Heart disease Brother        #2  . Colon cancer Neg Hx   . Esophageal cancer Neg Hx     Social History   Socioeconomic History  . Marital status: Married    Spouse name: Not on file  . Number of children: 2  .  Years of education: Not on file  . Highest education level: Not on file  Occupational History  . Occupation: retired   Tobacco Use  . Smoking status: Never Smoker  . Smokeless tobacco: Never Used  Vaping Use  . Vaping Use: Never used  Substance and Sexual Activity  . Alcohol use: No  . Drug use: No  . Sexual activity: Yes    Birth control/protection: None  Other Topics Concern  . Not on file  Social History Narrative  . Not on file   Social Determinants of Health   Financial Resource Strain:   . Difficulty of Paying Living Expenses: Not on file  Food Insecurity:   . Worried About Charity fundraiser in the Last Year: Not on file  . Ran Out of Food in the Last Year: Not on file  Transportation Needs:   . Lack of Transportation (Medical): Not on file  . Lack of Transportation (Non-Medical): Not on file  Physical Activity:   . Days of Exercise per Week: Not on file  . Minutes of Exercise per Session: Not on file  Stress:   . Feeling of Stress : Not on file  Social Connections:   . Frequency of Communication with Friends and Family: Not on file  . Frequency of Social Gatherings with Friends and Family: Not on file  . Attends Religious Services: Not on file  . Active Member of Clubs or Organizations: Not on file  . Attends Archivist Meetings: Not on file  . Marital Status: Not on file     Review of Systems: A 12 point ROS discussed and pertinent positives are indicated in the HPI above.  All other systems are negative.  Review of Systems  Constitutional: Negative for chills and fever.  Respiratory: Negative for shortness of breath and wheezing.   Cardiovascular: Negative for chest pain and palpitations.  Gastrointestinal: Negative for abdominal pain.  Genitourinary: Negative for dysuria, frequency and hematuria.  Musculoskeletal: Positive for back pain.  Neurological: Negative for headaches.  Psychiatric/Behavioral: Negative for behavioral problems and confusion.    Vital Signs: BP (!) 148/53   Pulse 67   Resp 18   Ht 5' 4"  (1.626 m)   Wt 141 lb (64 kg)   LMP  (LMP  Unknown)   SpO2 100%   BMI 24.20 kg/m   Physical Exam Vitals and nursing note reviewed.  Constitutional:      General: She is not in acute distress.    Appearance: Normal appearance.  Cardiovascular:     Rate and Rhythm: Normal rate and regular rhythm.     Heart sounds: Normal heart sounds. No murmur heard.   Pulmonary:     Effort: Pulmonary effort is normal. No respiratory distress.     Breath sounds: Normal breath sounds. No wheezing.  Skin:    General: Skin is warm and dry.  Neurological:     Mental Status: She is alert and oriented to person, place, and time.      MD Evaluation Airway: WNL Heart: WNL Abdomen: WNL Chest/ Lungs: WNL ASA  Classification: 3 Mallampati/Airway Score: Two   Imaging: DG Thoracic Spine 2 View  Result Date: 01/20/2020 CLINICAL DATA:  Fall EXAM: THORACIC SPINE 2 VIEWS COMPARISON:  None. FINDINGS: Normal thoracic kyphosis. No fracture or dislocation is seen. Mild degenerative changes of the thoracic spine. 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 Median sternotomy. IMPRESSION: Negative. Electronically Signed   By: Henderson Newcomer.D.  On: 01/20/2020 11:04   DG Lumbar Spine Complete  Result Date: 01/20/2020 CLINICAL DATA:  Fall EXAM: LUMBAR SPINE - COMPLETE 4+ VIEW COMPARISON:  CT abdomen/pelvis dated 10/30/2019 FINDINGS: Five lumbar-type vertebral bodies. Normal lumbar lordosis. Mild superior endplate compression fracture deformity at L1, age indeterminate, but new from prior CT. No retropulsion. Mild superior/inferior endplate changes at L3, corresponding to Schmorl's node changes on prior CT, chronic. Mild to moderate degenerative changes of the lower lumbar spine. Visualized bony pelvis appears intact. IMPRESSION: Mild superior endplate compression fracture deformity at L1, age indeterminate but new from prior CT. No retropulsion. Correlate for point tenderness. Mild to moderate degenerative changes of the lower  lumbar spine. Electronically Signed   By: Julian Hy M.D.   On: 01/20/2020 11:06   DG Sacrum/Coccyx  Result Date: 01/20/2020 CLINICAL DATA:  Fall EXAM: SACRUM AND COCCYX - 2+ VIEW COMPARISON:  None. FINDINGS: Nondisplaced fracture of the left transverse process at L5. Mild irregularity involving the left sacral ala, although possibly involving overlying bowel-gas, indeterminate. Visualized bony pelvis is otherwise intact. Bilateral hip joint spaces are preserved. No displaced sacrococcygeal fracture is evident on the lateral view. IMPRESSION: Nondisplaced fracture of the left transverse process at L5. Mild irregularity involving the left sacral ala, equivocal. Electronically Signed   By: Julian Hy M.D.   On: 01/20/2020 11:09   CT Head Wo Contrast  Result Date: 01/20/2020 CLINICAL DATA:  Fall EXAM: CT HEAD WITHOUT CONTRAST TECHNIQUE: Contiguous axial images were obtained from the base of the skull through the vertex without intravenous contrast. COMPARISON:  2012 FINDINGS: Brain: There is no acute intracranial hemorrhage, mass effect, or edema. Gray-white differentiation is preserved. There is no extra-axial fluid collection. Ventricles and sulci are stable in size and configuration. Minimal patchy hypoattenuation in the supratentorial white matter is nonspecific but may reflect minor chronic microvascular ischemic changes. Vascular: There is atherosclerotic calcification at the skull base. Skull: Calvarium is unremarkable. Sinuses/Orbits: No acute finding. Other: None. IMPRESSION: No evidence of acute intracranial injury. Electronically Signed   By: [name] M.D.   On: 01/20/2020 11:17   CT Cervical Spine Wo Contrast  Result Date: 01/20/2020 CLINICAL DATA:  Fall EXAM: CT CERVICAL SPINE WITHOUT CONTRAST TECHNIQUE: Multidetector CT imaging of the cervical spine was performed without intravenous contrast. Multiplanar CT image reconstructions were also generated. COMPARISON:  None.  FINDINGS: Alignment: Anteroposterior alignment is maintained. Skull base and vertebrae: Vertebral body heights are preserved. No acute cervical spine fracture. Soft tissues and spinal canal: No prevertebral fluid or swelling. No visible canal hematoma. Disc levels: Multilevel degenerative changes are present including disc space narrowing, endplate osteophytes, and facet and uncovertebral hypertrophy. There is resulting up to moderate canal stenosis. No high-grade osseous encroachment on the neural foramina. Upper chest: No apical lung mass. Other: Heavily calcified common carotid bifurcations. IMPRESSION: No acute cervical spine fracture. Electronically Signed   By: [name] M.D.   On: 01/20/2020 11:21   MR Lumbar Spine w/o contrast  Result Date: 01/28/2020 CLINICAL DATA:  Low back pain. Compression fractures. [place] 01/19/2020. EXAM: MRI LUMBAR SPINE WITHOUT CONTRAST TECHNIQUE: Multiplanar, multisequence MR imaging of the lumbar spine was performed. No intravenous contrast was administered. COMPARISON:  01/20/2020.  CT 10/30/2019. FINDINGS: Segmentation:  5 lumbar type vertebral bodies. Alignment:  Straightening of the normal lumbar lordosis. Vertebrae: Acute or subacute compression fracture at L1 with loss of height of 25%. No retropulsed bone. No finding to suggest that this is anything other than a benign fracture.  Old appearing superior and inferior endplate Schmorl's nodes at L3. Some marrow edema associated with the inferior endplate Schmorl's node, but not likely to be related to a recent fracture. Mild edema at the superior endplate of L4 on the left which could represent a minimal superior endplate fracture at this level. Conus medullaris and cauda equina: Conus extends to the L1-2 level. Conus and cauda equina appear normal. Paraspinal and other soft tissues: Negative Disc levels: Minimal non-compressive disc bulges at T12-L1 and L1-2. L2-3: Broad-based disc herniation. Moderate stenosis at this  level that could possibly cause neural compression. This was probably present on the CT from May of this year. L3-4: Bulging of the disc. Facet and ligamentous hypertrophy. Mild multifactorial stenosis which could possibly cause neural compression in the lateral recesses. L4-5: Endplate osteophytes and bulging of the disc. Facet and ligamentous hypertrophy. Stenosis of both lateral recesses and of the intervertebral foramen on the right that could possibly cause neural compression. L5-S1: Mild disc bulge.  No compressive stenosis. IMPRESSION: Acute or subacute compression fracture at L1 with loss of height of 25%. No retropulsed bone. No finding to suggest that this represents anything other than a benign osteoporotic fracture. Superior endplate edema on the left at L4 that could possibly represent a minor superior endplate fracture in this location, or could be discogenic. Old superior and inferior endplate Schmorl's nodes at L3. Some edema associated with the inferior Schmorl's node, felt not to relate to an acute injury. L2-3: Broad-based disc herniation. Moderate stenosis that could possibly cause neural compression. This was probably present at the time of the CT study in May of 2021. L3-4: Mild multifactorial stenosis. Potential for neural compression in the lateral recesses. L4-5: Bilateral lateral recess stenosis and intervertebral foraminal stenosis on the right that could cause neural compression. Electronically Signed   By: Nelson Chimes M.D.   On: 01/28/2020 14:49   US RENAL  Result Date: 01/29/2020 CLINICAL DATA:  Acute kidney injury. EXAM: RENAL / URINARY TRACT ULTRASOUND COMPLETE COMPARISON:  CT abdomen and pelvis 10/30/2019. FINDINGS: Right Kidney: Renal measurements: 7.5 x 5.1 x 5.3 = volume: 106 mL. Echogenicity is increased. No mass or hydronephrosis visualized. Left Kidney: Renal measurements: 8.2 x 4.9 x 4.2 = volume: 88.4 mL. Echogenicity is increased. No mass or hydronephrosis visualized.  Bladder: Appears normal for degree of bladder distention. Other: Simple right hepatic is unchanged. IMPRESSION: Negative for hydronephrosis or acute abnormality. Increased cortical echogenicity bilaterally consistent with medical renal disease. Electronically Signed   By: Inge Rise M.D.   On: 01/29/2020 14:24   CUP PACEART REMOTE DEVICE CHECK  Result Date: 02/09/2020 [facility] summary report received. Battery status OK. Normal device function. No new symptom episodes, tachy episodes, brady, or pause episodes. No new AF episodes. Monthly summary reports and ROV/PRN Kathy Breach, RN,. CCDS, [employer]  VAS US CAROTID  Result Date: 01/29/2020 Carotid Arterial Duplex Study Indications:       Bilateral carotid artery stenosis. Patient denies any                    cerebrovascular symptoms. Risk Factors:      Hypertension, hyperlipidemia, Diabetes, no history of                    smoking, coronary artery disease. Comparison Study:  In 10/2017, a carotid duplex showed velocities of 142/24 cm/s  in the RICA and 384/66 cm/s in the LICA. Performing Technologist: Sharlett Iles RVT  Examination Guidelines: A complete evaluation includes B-mode imaging, spectral Doppler, color Doppler, and power Doppler as needed of all accessible portions of each vessel. Bilateral testing is considered an integral part of a complete examination. Limited examinations for reoccurring indications may be performed as noted.  Right Carotid Findings: +----------+--------+--------+--------+------------------+---------------------+           PSV cm/sEDV cm/sStenosisPlaque DescriptionComments              +----------+--------+--------+--------+------------------+---------------------+ CCA Prox  88      0               heterogenous                            +----------+--------+--------+--------+------------------+---------------------+ CCA Mid   57      6               heterogenous                             +----------+--------+--------+--------+------------------+---------------------+ CCA Distal48      7               heterogenous                            +----------+--------+--------+--------+------------------+---------------------+ ICA Prox  139     16              heterogenous      Shadowing             +----------+--------+--------+--------+------------------+---------------------+ ICA Mid   141     14      1-39%                                           +----------+--------+--------+--------+------------------+---------------------+ ICA Distal104     17                                                      +----------+--------+--------+--------+------------------+---------------------+ ECA       173     0       >50%    heterogenous      unable to determine a                                                     more significant                                                          stenosis due to  acoustic shadowing                                                        from plaque formation +----------+--------+--------+--------+------------------+---------------------+ +----------+--------+-------+----------------+-------------------+           PSV cm/sEDV cmsDescribe        Arm Pressure (mmHG) +----------+--------+-------+----------------+-------------------+ [id number]            Multiphasic, [id number]                 +----------+--------+-------+----------------+-------------------+ +---------+--------+--+--------+-+---------+ VertebralPSV cm/s83EDV cm/s6Antegrade +---------+--------+--+--------+-+---------+ Velocities in the RICA remain within normal range and stable compared to the prior exam. Unable to determine a more significant stenosis due to acoustic shadowing from plaque formation Left Carotid Findings:  +----------+--------+--------+--------+--------------------------+--------+           PSV cm/sEDV cm/sStenosisPlaque Description        Comments +----------+--------+--------+--------+--------------------------+--------+ CCA Prox  84      7               heterogenous                       +----------+--------+--------+--------+--------------------------+--------+ CCA Mid   66      8               heterogenous                       +----------+--------+--------+--------+--------------------------+--------+ CCA Distal64      8               heterogenous                       +----------+--------+--------+--------+--------------------------+--------+ ICA Prox  140     20      1-39%   heterogenous                       +----------+--------+--------+--------+--------------------------+--------+ ICA Mid   108     21                                                 +----------+--------+--------+--------+--------------------------+--------+ ICA Distal153     24                                                 +----------+--------+--------+--------+--------------------------+--------+ ECA       311     13      >50%    heterogenous and irregular         +----------+--------+--------+--------+--------------------------+--------+ +----------+--------+--------+----------------+-------------------+           PSV cm/sEDV cm/sDescribe        Arm Pressure (mmHG) +----------+--------+--------+----------------+-------------------+ [id number]             Multiphasic, [id number]                 +----------+--------+--------+----------------+-------------------+ +---------+--------+--+--------+--+---------+ VertebralPSV cm/s70EDV cm/s10Antegrade +---------+--------+--+--------+--+---------+ Velocities in the LICA remain within normal range and stable compared to the prior exam.  Summary: Right Carotid: Velocities in the right ICA are consistent  with a 1-39% stenosis.                 Non-hemodynamically significant plaque <50% noted in the CCA. The                ECA appears >50% stenosed. Left Carotid: Velocities in the left ICA are consistent with a 1-39% stenosis.               Non-hemodynamically significant plaque <50% noted in the CCA. The               ECA appears >50% stenosed. Vertebrals:  Bilateral vertebral arteries demonstrate antegrade flow. Subclavians: Normal flow hemodynamics were seen in bilateral subclavian              arteries. *See table(s) above for measurements and observations.  Electronically signed by Quay Burow MD on 01/29/2020 at 5:14:15 PM.    Final     Labs:  CBC: Recent Labs    12/31/19 1315 01/15/20 1414 01/22/20 0855 02/04/20 1416  WBC 5.5 5.8 7.0 5.0  HGB 7.8* 7.5* 10.3* 11.4*  HCT 26.3* 24.3* 32.9* 37.9  PLT 118* 131* 108* 118*    COAGS: Recent Labs    03/04/19 1418 06/24/19 1325  INR 1.9* 1.4*  APTT  --  34    BMP: Recent Labs    10/30/19 2059 10/30/19 2059 12/11/19 1012 12/31/19 1315 01/22/20 0855 02/04/20 1416  NA 139  --   --  138 136 140  K 3.6  --   --  4.9 4.5 5.2*  CL 100  --   --  101 105 111  CO2 24  --   --  28 22 24   GLUCOSE 184*   < > 147* 289* 157* 131*  BUN 42*  --   --  46* 48* 28*  CALCIUM 8.7*  --   --  8.4* 9.4 9.6  CREATININE 2.59*  --   --  2.50* 3.57* 2.75*  GFRNONAA 18*  --   --  18* 12* 16*  GFRAA 20*  --   --  21* 14* 19*   < > = values in this interval not displayed.    LIVER FUNCTION TESTS: Recent Labs    10/30/19 2059 12/31/19 1315 01/22/20 0855 02/04/20 1416  BILITOT 0.9 0.6 1.1 0.5  AST 41 55* 27 54*  ALT 31 32 26 43  ALKPHOS 63 58 71 128*  PROT 6.9 6.0* 6.1* 6.4*  ALBUMIN 3.1* 2.8* 2.6* 2.6*     Assessment and Plan:  Acute/subacute L1 compression fracture, seen on MR lumbar spine 01/28/2020. Plan for image-guided L1 kyphoplasty/vertebroplasty today in IR with Dr. Estanislado Pandy. Patient is NPO. Afebrile. Eliquis held per IR protocol. INR and UA  pending- will review prior to proceeding with procedure today.  Risks and benefits of L1 kyphoplasty/vertebroplasty were discussed with the patient including, but not limited to education regarding the natural healing process of compression fractures without intervention, bleeding, infection, cement migration which may cause spinal cord damage, paralysis, pulmonary embolism or even death. This interventional procedure involves the use of X-rays and because of the nature of the planned procedure, it is possible that we will have prolonged use of X-ray fluoroscopy. Potential radiation risks to you include (but are not limited to) the following: - A slightly elevated risk for cancer  several years later in life. This risk is typically less than 0.5% percent. This risk is low in comparison to the normal incidence of human cancer,  which is 33% for women and 50% for men according to the [employer]. - Radiation induced injury can include skin redness, resembling a rash, tissue breakdown / ulcers and hair loss (which can be temporary or permanent).  The likelihood of either of these occurring depends on the difficulty of the procedure and whether you are sensitive to radiation due to previous procedures, disease, or genetic conditions.  IF your procedure requires a prolonged use of radiation, you will be notified and given written instructions for further action.  It is your responsibility to monitor the irradiated area for the 2 weeks following the procedure and to notify your physician if you are concerned that you have suffered a radiation induced injury.   All of the patient's questions were answered, patient is agreeable to proceed. Consent signed and in chart.   Thank you for this interesting consult.  I greatly enjoyed meeting CATHYANN KILFOYLE and look forward to participating in their care.  A copy of this report was sent to the requesting provider on this date.  Electronically  Signed: Earley Abide, PA-C 02/12/2020, 10:01 AM   I spent a total of 40 Minutes in face to face in clinical consultation, greater than 50% of which was counseling/coordinating care for L1 compression fracture/vertebral augmentation.

## 2020-02-12 NOTE — Procedures (Signed)
S/P  L1 balloon KP. °S.Mckinze Poirier MD °

## 2020-02-16 LAB — POCT I-STAT, CHEM 8
BUN: 48 mg/dL — ABNORMAL HIGH (ref 8–23)
Calcium, Ion: 0.96 mmol/L — ABNORMAL LOW (ref 1.15–1.40)
Chloride: 117 mmol/L — ABNORMAL HIGH (ref 98–111)
Creatinine, Ser: 2.1 mg/dL — ABNORMAL HIGH (ref 0.44–1.00)
Glucose, Bld: 86 mg/dL (ref 70–99)
HCT: 25 % — ABNORMAL LOW (ref 36.0–46.0)
Hemoglobin: 8.5 g/dL — ABNORMAL LOW (ref 12.0–15.0)
Potassium: 7.2 mmol/L (ref 3.5–5.1)
Sodium: 143 mmol/L (ref 135–145)
TCO2: 19 mmol/L — ABNORMAL LOW (ref 22–32)

## 2020-02-16 IMAGING — US US ABDOMEN LIMITED
1 series · 14 of 25 positions shown · non-contrast
Comparison: Abdominal ultrasound performed 05/19/2018

CLINICAL DATA: Acute onset of generalized abdominal pain.

EXAM:
ULTRASOUND ABDOMEN LIMITED RIGHT UPPER QUADRANT

[Series 1: us abdomen limited · 14 of 54 slices shown]
[im 1/54]
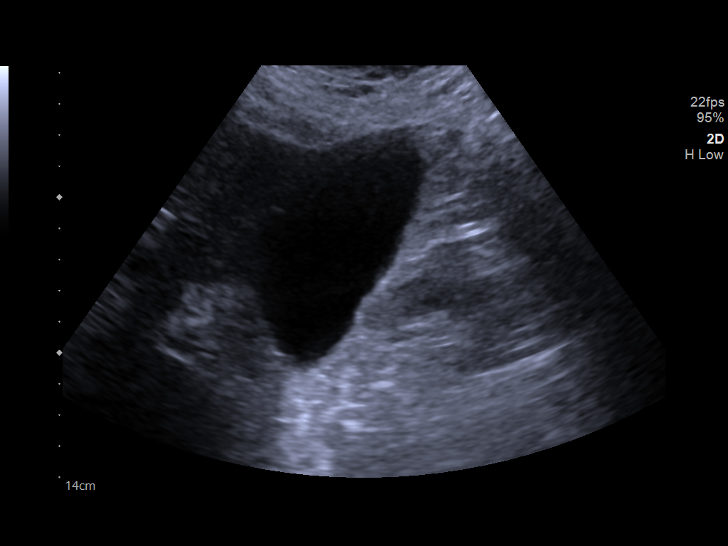
[im 5/54]
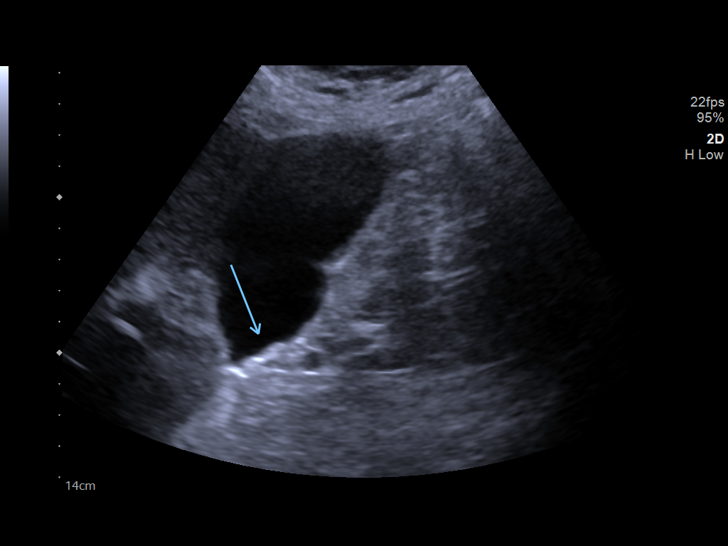
[im 9/54]
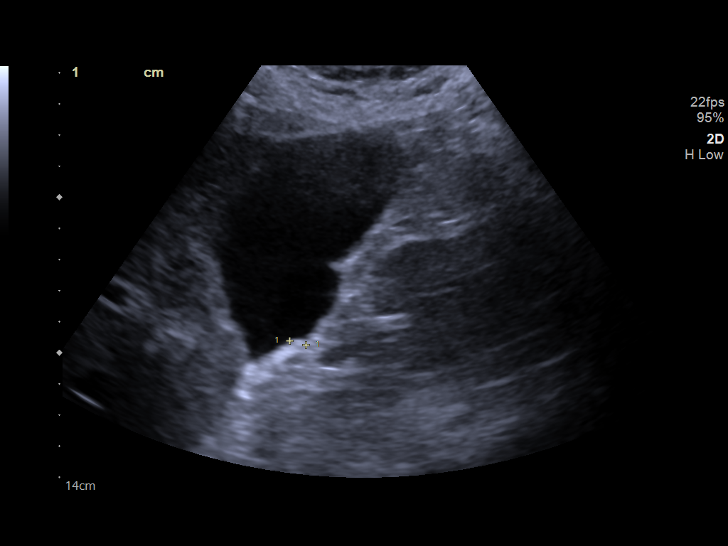
[im 14/54]
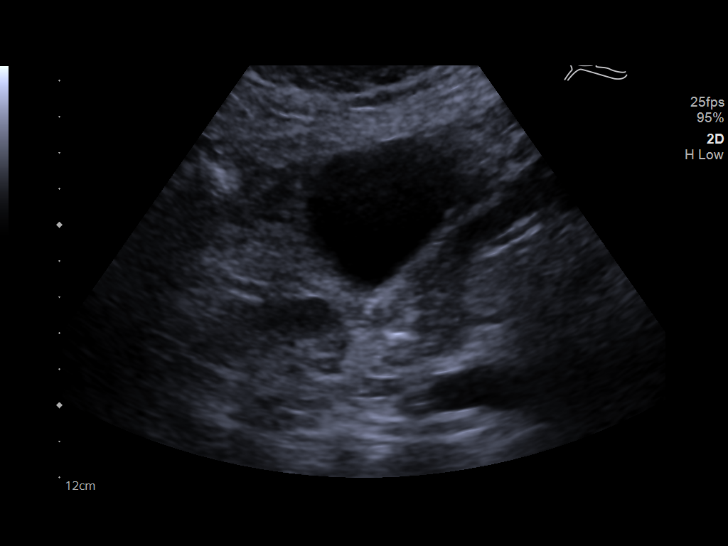
[im 18/54]
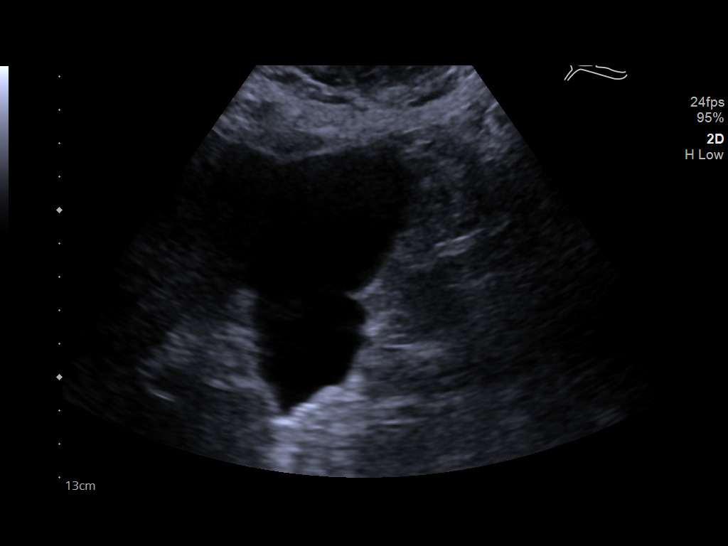
[im 20/54]
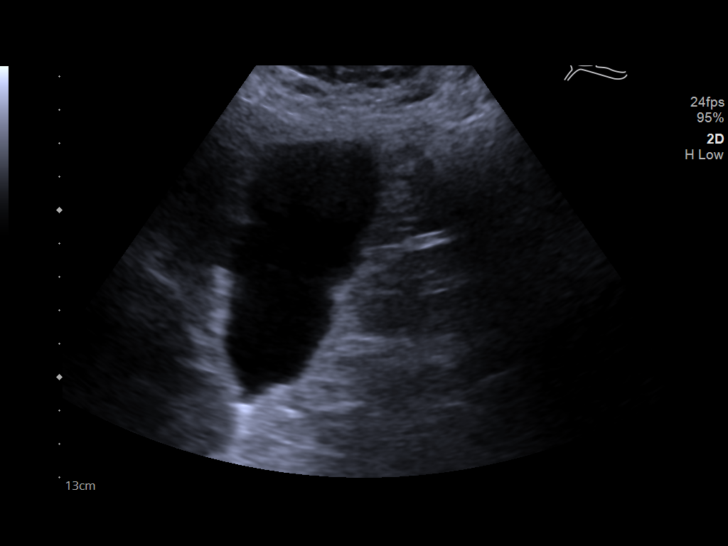
[im 25/54]
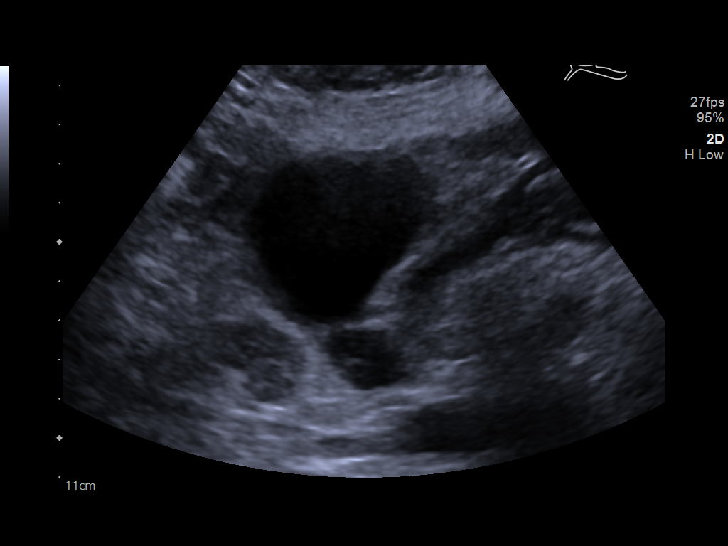
[im 29/54]
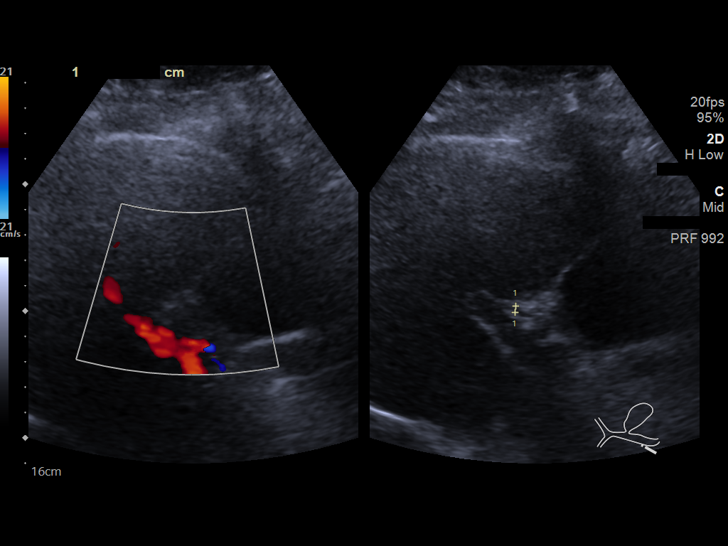
[im 34/54]
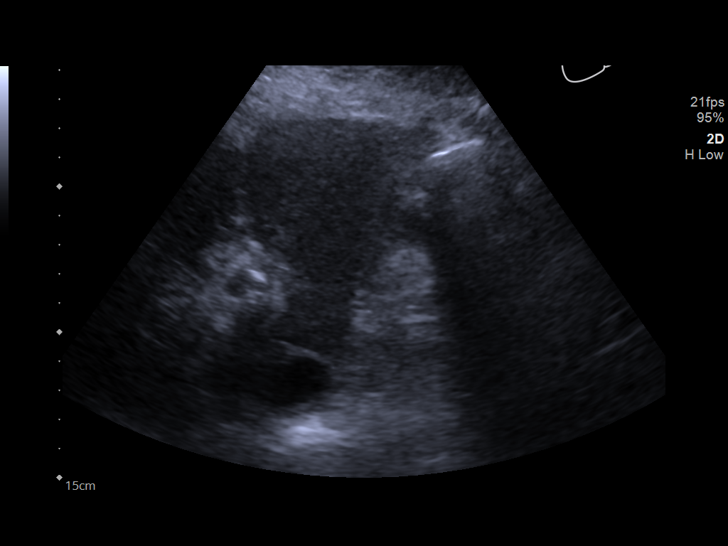
[im 36/54]
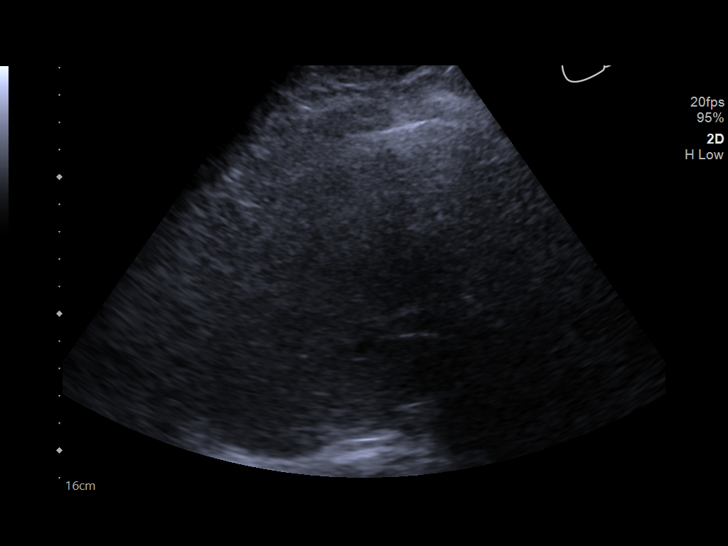
[im 40/54]
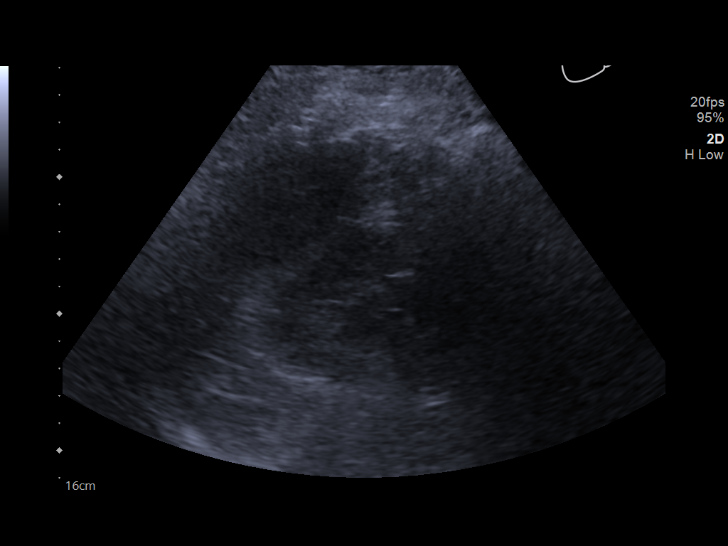
[im 45/54]
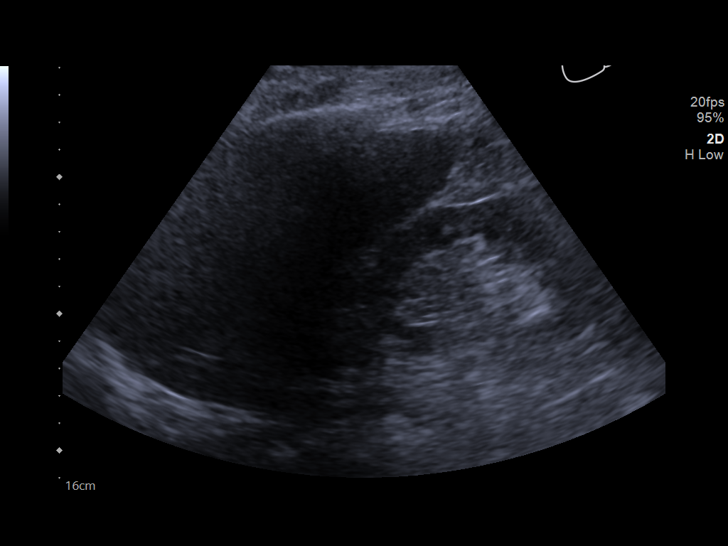
[im 49/54]
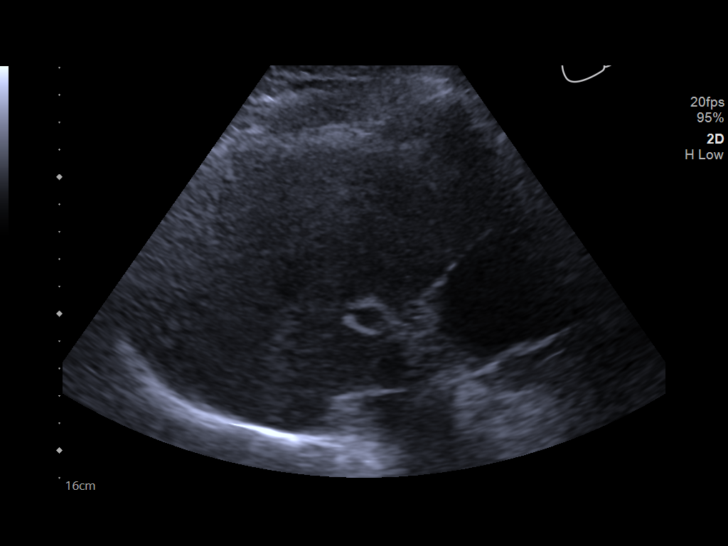
[im 54/54]
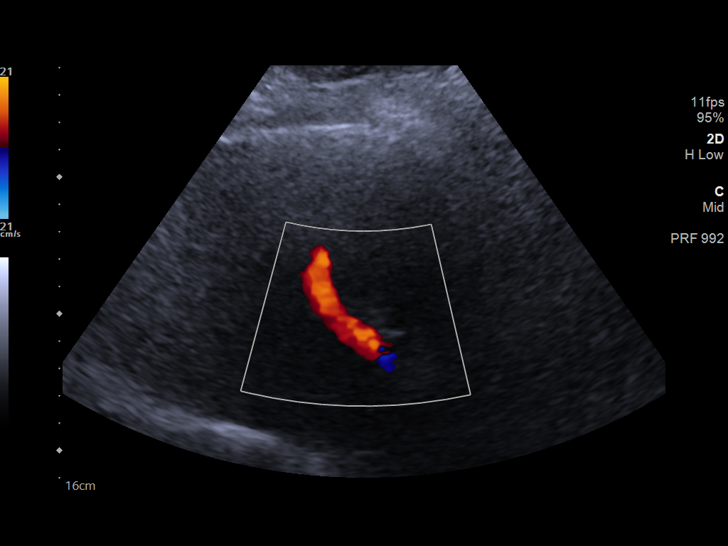

[14 of 25 positions shown; findings below may reference images not displayed]

FINDINGS: Gallbladder:

Small stones and sludge are noted within the gallbladder, similar to
the prior ultrasound. No gallbladder wall thickening or
pericholecystic fluid is seen. No ultrasonographic Murphy's sign is
elicited.

Common bile duct:

Diameter: 0.3 cm, within normal limits in caliber.

Liver:

No focal lesion identified. Within normal limits in parenchymal
echogenicity. Portal vein is patent on color Doppler imaging with
normal direction of blood flow towards the liver.
IMPRESSION: 1. No acute abnormality seen to explain the patient's symptoms.
2. Cholelithiasis and sludge within the gallbladder, similar to the
recent prior ultrasound. Gallbladder otherwise unremarkable.

## 2020-02-17 LAB — SURGICAL PATHOLOGY

## 2020-02-23 ENCOUNTER — Ambulatory Visit (INDEPENDENT_AMBULATORY_CARE_PROVIDER_SITE_OTHER): Payer: Medicare Other | Admitting: Nurse Practitioner

## 2020-02-23 ENCOUNTER — Encounter: Payer: Self-pay | Admitting: Nurse Practitioner

## 2020-02-23 ENCOUNTER — Other Ambulatory Visit: Payer: Self-pay

## 2020-02-23 VITALS — BP 108/58 | HR 65 | Ht 64.0 in | Wt 133.2 lb

## 2020-02-23 DIAGNOSIS — N184 Chronic kidney disease, stage 4 (severe): Secondary | ICD-10-CM

## 2020-02-23 DIAGNOSIS — Z79899 Other long term (current) drug therapy: Secondary | ICD-10-CM

## 2020-02-23 DIAGNOSIS — Z952 Presence of prosthetic heart valve: Secondary | ICD-10-CM

## 2020-02-23 DIAGNOSIS — I2510 Atherosclerotic heart disease of native coronary artery without angina pectoris: Secondary | ICD-10-CM | POA: Diagnosis not present

## 2020-02-23 DIAGNOSIS — I5032 Chronic diastolic (congestive) heart failure: Secondary | ICD-10-CM

## 2020-02-23 DIAGNOSIS — E782 Mixed hyperlipidemia: Secondary | ICD-10-CM

## 2020-02-23 DIAGNOSIS — I480 Paroxysmal atrial fibrillation: Secondary | ICD-10-CM

## 2020-02-23 MED ORDER — METOPROLOL TARTRATE 25 MG PO TABS: 25.0000 mg | ORAL_TABLET | Freq: Two times a day (BID) | ORAL | 3 refills | Status: DC

## 2020-02-23 NOTE — Patient Instructions (Signed)
After Visit Summary:  We will be checking the following labs today - BMET, CBC, HPF, TSH   Medication Instructions:    Continue with your current medicines.    If you need a refill on your cardiac medications before your next appointment, please call your pharmacy.     Testing/Procedures To Be Arranged:  N/A  Follow-Up:   See me in 6 weeks    At [facility], you and your health needs are our priority.  As part of our continuing mission to provide you with exceptional heart care, we have created designated Provider Care Teams.  These Care Teams include your primary Cardiologist (physician) and Advanced Practice Providers (APPs -  Physician Assistants and Nurse Practitioners) who all work together to provide you with the care you need, when you need it.  Special Instructions:  . Stay safe, wash your hands for at least 20 seconds and wear a mask when needed.  . It was good to talk with you both today.    Call the [facility] office at [phone] if you have any questions, problems or concerns.

## 2020-02-24 ENCOUNTER — Other Ambulatory Visit: Payer: Self-pay | Admitting: Adult Health

## 2020-02-24 ENCOUNTER — Ambulatory Visit
Admission: RE | Admit: 2020-02-24 | Discharge: 2020-02-24 | Disposition: A | Discharge status: Home / Self Care | Payer: Medicare Other | Source: Ambulatory Visit | Attending: Adult Health | Admitting: Adult Health

## 2020-02-24 ENCOUNTER — Other Ambulatory Visit: Payer: Self-pay | Admitting: *Deleted

## 2020-02-24 ENCOUNTER — Other Ambulatory Visit: Payer: Self-pay | Admitting: Nurse Practitioner

## 2020-02-24 DIAGNOSIS — Z9889 Other specified postprocedural states: Secondary | ICD-10-CM

## 2020-02-24 DIAGNOSIS — R928 Other abnormal and inconclusive findings on diagnostic imaging of breast: Secondary | ICD-10-CM

## 2020-02-24 DIAGNOSIS — E875 Hyperkalemia: Secondary | ICD-10-CM

## 2020-02-24 LAB — CBC
Hematocrit: 31.7 % — ABNORMAL LOW (ref 34.0–46.6)
Hemoglobin: 10.5 g/dL — ABNORMAL LOW (ref 11.1–15.9)
MCH: 31.7 pg (ref 26.6–33.0)
MCHC: 33.1 g/dL (ref 31.5–35.7)
MCV: 96 fL (ref 79–97)
Platelets: 137 10*3/uL — ABNORMAL LOW (ref 150–450)
RBC: 3.31 x10E6/uL — ABNORMAL LOW (ref 3.77–5.28)
RDW: 14.7 % (ref 11.7–15.4)
WBC: 7.4 10*3/uL (ref 3.4–10.8)

## 2020-02-24 LAB — BASIC METABOLIC PANEL
BUN/Creatinine Ratio: 12 (ref 12–28)
BUN: 32 mg/dL — ABNORMAL HIGH (ref 8–27)
CO2: 22 mmol/L (ref 20–29)
Calcium: 9.1 mg/dL (ref 8.7–10.3)
Chloride: 107 mmol/L — ABNORMAL HIGH (ref 96–106)
Creatinine, Ser: 2.62 mg/dL — ABNORMAL HIGH (ref 0.57–1.00)
GFR calc Af Amer: 20 mL/min/{1.73_m2} — ABNORMAL LOW (ref >59)
GFR calc non Af Amer: 17 mL/min/{1.73_m2} — ABNORMAL LOW (ref >59)
Glucose: 171 mg/dL — ABNORMAL HIGH (ref 65–99)
Potassium: 6.2 mmol/L (ref 3.5–5.2)
Sodium: 139 mmol/L (ref 134–144)

## 2020-02-24 LAB — LIPID PANEL
Chol/HDL Ratio: 3.2 ratio (ref 0.0–4.4)
Cholesterol, Total: 106 mg/dL (ref 100–199)
HDL: 33 mg/dL — ABNORMAL LOW (ref >39)
LDL Chol Calc (NIH): 58 mg/dL (ref 0–99)
Triglycerides: 74 mg/dL (ref 0–149)
VLDL Cholesterol Cal: 15 mg/dL (ref 5–40)

## 2020-02-24 LAB — HEPATIC FUNCTION PANEL
ALT: 31 IU/L (ref 0–32)
AST: 35 IU/L (ref 0–40)
Albumin: 3.3 g/dL — ABNORMAL LOW (ref 3.7–4.7)
Alkaline Phosphatase: 112 IU/L (ref 44–121)
Bilirubin Total: 0.4 mg/dL (ref 0.0–1.2)
Bilirubin, Direct: 0.15 mg/dL (ref 0.00–0.40)
Total Protein: 6.1 g/dL (ref 6.0–8.5)

## 2020-02-24 LAB — TSH: TSH: 1.86 u[IU]/mL (ref 0.450–4.500)

## 2020-02-24 MED ORDER — SODIUM POLYSTYRENE SULFONATE 15 GM/60ML PO SUSP: 30.0000 g | Freq: Once | ORAL | 0 refills | Status: AC

## 2020-02-24 NOTE — Telephone Encounter (Signed)
Could you please get more information, this message is unclear. Do no know what SPS dosage is. Please clarify

## 2020-02-24 NOTE — Telephone Encounter (Signed)
S/w Desiree at [facility] to clarify  kayalexate dosage. Also s/w pt to let pt know pharmacy will be calling pt. Also clarified pt needs to come in on Friday, Sept 17 for repeat bmet. appt made, orders in and linked.

## 2020-02-24 NOTE — Telephone Encounter (Signed)
New Message:    She needs clarification on the SPS dosage please. She also want you to know they will not have it until tomorrow.

## 2020-02-25 ENCOUNTER — Ambulatory Visit (INDEPENDENT_AMBULATORY_CARE_PROVIDER_SITE_OTHER): Payer: Medicare Other | Admitting: Orthopaedic Surgery

## 2020-02-25 ENCOUNTER — Encounter: Payer: Self-pay | Admitting: Orthopaedic Surgery

## 2020-02-25 ENCOUNTER — Other Ambulatory Visit: Payer: Self-pay

## 2020-02-25 DIAGNOSIS — S42292D Other displaced fracture of upper end of left humerus, subsequent encounter for fracture with routine healing: Secondary | ICD-10-CM

## 2020-02-25 DIAGNOSIS — S32010D Wedge compression fracture of first lumbar vertebra, subsequent encounter for fracture with routine healing: Secondary | ICD-10-CM

## 2020-02-25 MED ORDER — BUPIVACAINE HCL 0.5 % IJ SOLN
2.0000 mL | INTRAMUSCULAR | Status: AC | PRN
  Administered 2020-02-25: 2 mL via INTRA_ARTICULAR

## 2020-02-25 MED ORDER — LIDOCAINE HCL 1 % IJ SOLN
2.0000 mL | INTRAMUSCULAR | Status: AC | PRN
  Administered 2020-02-25: 2 mL

## 2020-02-25 MED ORDER — METHYLPREDNISOLONE ACETATE 40 MG/ML IJ SUSP
80.0000 mg | INTRAMUSCULAR | Status: AC | PRN
  Administered 2020-02-25: 80 mg via INTRA_ARTICULAR

## 2020-02-25 NOTE — Progress Notes (Signed)
Office Visit Note   Patient: Hannah [name]           Date of Birth: [birth year]           MRN: [id number] Visit Date: 02/25/2020              Requested by: [name], DO [place] [address],  [place] [id number] PCP: [name], DO   Assessment & Plan: Visit Diagnoses:  1. Closed fracture of head of left humerus with routine healing, subsequent encounter   2. Compression fracture of L1 vertebra with routine healing     Plan: Mrs. Ow is accompanied by her husband and here for follow-up evaluation of the L1 compression fracture.  She has had a kyphoplasty performed through the interventional radiology department at [facility].  She notes she has had exceptionally good relief of her pain.  We discussed activity level and like her to get up and move around its been 2 weeks since her procedure and I do not want her to get too weak.  We will plan to see her back as needed.  Also has a history of a left humeral head fracture with subsequent arthritis of the glenohumeral joint.  Per our previous discussion will inject the joint with cortisone today.  She will monitor her sugars as she is an insulin-dependent diabetic  Follow-Up Instructions: Return if symptoms worsen or fail to improve.   Orders:  Orders Placed This Encounter  Procedures  . Large Joint Inj: L glenohumeral   No orders of the defined types were placed in this encounter.     Procedures: Large Joint Inj: L glenohumeral on 02/25/2020 2:58 PM Details: 25 G 1.5 in needle, anteromedial approach  Arthrogram: No  Medications: 2 mL lidocaine 1 %; 2 mL bupivacaine 0.5 %; 80 mg methylPREDNISolone acetate 40 MG/ML Procedure, treatment alternatives, risks and benefits explained, specific risks discussed. Consent was given by the patient. Immediately prior to procedure a time out was called to verify the correct patient, procedure, equipment, support staff and site/side marked as required. Patient was prepped and draped  in the usual sterile fashion.       Clinical Data: No additional findings.   Subjective: Chief Complaint  Patient presents with  . Lower Back - Follow-up  Patient presents today for follow up on her lower back. She had a lumbar kyphoplasty with Dr.Deveswhar on 02/12/2020. She states that she is much better, and has no back pain. She does state that she feels weak. She was told to use a walker for two weeks, so she has been using that. She is in a wheelchair today for her office visit.  Also wanted to consider a cortisone injection of the left shoulder.  Prior films demonstrate an old proximal humeral head fracture with glenohumeral joint arthritis  HPI  Review of Systems  Constitutional: Negative for fatigue.  HENT: Negative for ear pain.   Eyes: Negative for pain.  Respiratory: Negative for shortness of breath.   Cardiovascular: Negative for leg swelling.  Gastrointestinal: Negative for constipation and diarrhea.  Endocrine: Negative for cold intolerance and heat intolerance.  Genitourinary: Negative for difficulty urinating.  Musculoskeletal: Negative for joint swelling.  Skin: Negative for rash.  Allergic/Immunologic: Negative for food allergies.  Neurological: Positive for weakness.  Hematological: Bruises/bleeds easily.  Psychiatric/Behavioral: Negative for sleep disturbance.     Objective: Vital Signs: LMP  (LMP Unknown)   Physical Exam Constitutional:      Appearance: She is  well-developed.  Eyes:     Pupils: Pupils are equal, round, and reactive to light.  Pulmonary:     Effort: Pulmonary effort is normal.  Skin:    General: Skin is warm and dry.  Neurological:     Mental Status: She is alert and oriented to person, place, and time.  Psychiatric:        Behavior: Behavior normal.     Ortho Exam examined in a wheelchair.  No percussible tenderness over lumbar spine.  No disc acute distress.  Does have limited and painful range of motion of the left  shoulder.  No change in her exam from prior evaluation Specialty Comments:  No specialty comments available.  Imaging: US BREAST LTD UNI RIGHT INC AXILLA  Result Date: 02/24/2020 CLINICAL DATA:  74 year old female with history of right breast cancer status post lumpectomy in 2017. Patient complains of retraction of the right breast at the lumpectomy site as well as a palpable lump. Patient underwent a right breast biopsy and 2 biopsies of superficial masses in the right axillary region on February 12, 2019 which all showed fat necrosis. Patient states that she is to be scheduled for a breast MRI. EXAM: DIGITAL DIAGNOSTIC BILATERAL MAMMOGRAM WITH CAD AND TOMO ULTRASOUND RIGHT BREAST COMPARISON:  Previous exam(s). ACR Breast Density Category c: The breast tissue is heterogeneously dense, which may obscure small masses. FINDINGS: No suspicious mass or malignant type microcalcifications identified in the left breast. The patient's right breast is firm and the best images were obtained. The right breast is retracted and developing mass in the upper-outer quadrant of the breast. There are no malignant type microcalcifications. Mammographic images were processed with CAD. On physical exam, the right breast is very firm to palpation and erythematous. I palpate a discrete mass in the right breast at 10 o'clock 3 cm from the nipple. Targeted ultrasound is performed, showing a hypoechoic mass in the right breast at 10 o'clock 3 cm from the nipple measuring 2.9 x 2.0 x 2.8 cm. Sonographic evaluation the right axilla does not show any enlarged adenopathy. IMPRESSION: Indeterminate mass in the 10 o'clock region of the right breast. This may be fat necrosis/oil cyst but malignancy cannot be excluded. RECOMMENDATION: Ultrasound-guided core biopsy in the mass in the 10 o'clock region of the right breast is recommended. The patient states that she is going to be scheduled for breast MRI. The ultrasound-guided core biopsy can  after the patient's breast MRI. I have discussed the findings and recommendations with the patient. If applicable, a reminder letter will be sent to the patient regarding the next appointment. BI-RADS CATEGORY  4: Suspicious. Electronically Signed   By: Lillia Mountain M.D.   On: 02/24/2020 16:07     PMFS History: Patient Active Problem List   Diagnosis Date Noted  . Compression fracture of L1 vertebra with routine healing 02/25/2020  . Compression fracture of L1 lumbar vertebra, closed, initial encounter ([place]) 01/27/2020  . Pain in left shoulder 03/26/2019  . History of breast cancer in female 03/26/2019  . Hypokalemia 02/20/2019  . Acute exacerbation of CHF (congestive heart failure) ([place]) 02/19/2019  . Pleural effusion on right 09/25/2018  . Thrombocytopenia ([place]) 09/25/2018  . Cholecystitis with cholelithiasis 07/07/2018  . Prolonged Q-T interval on ECG 05/21/2018  . Atrial fibrillation ([place]) 01/23/2018  . Closed fracture of head of left humerus 09/09/2017  . Syncope 09/06/2017  . Seizure-like activity ([place]) 09/01/2017  . AKI (acute kidney injury) ([place]) 09/01/2017  . Adrenal insufficiency (  [place]) 07/03/2016  . Seizure ([place]) 06/29/2016  . Chemotherapy-induced peripheral neuropathy ([place]) 04/27/2016  . 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 Osteomyelitis of toe of left foot ([place]) 04/06/2016  . Mania ([place]) 03/05/2016  . GERD (gastroesophageal reflux disease) 02/29/2016  . Chemotherapy induced nausea and vomiting 02/24/2016  . Port catheter in place 02/17/2016  . Sensorineural hearing loss (SNHL) of both ears 01/06/2016  . Breast cancer of upper-inner quadrant of right female breast ([place]) 12/16/2015  . Osteopenia of multiple sites 10/19/2015  . CKD (chronic kidney disease), stage IV ([place]) 09/24/2013  . Chronic diastolic CHF (congestive heart failure) ([place]) 09/24/2013  . Acute on chronic combined systolic and diastolic congestive heart failure ([place]) 07/17/2013  . Atrial  tachycardia ([place]) 04/03/2013  . CAD (coronary artery disease) 01/24/2011  . Chronic coronary artery disease 01/24/2011  . History of aortic valve replacement 10/27/2010  . Hyperlipidemia   . DOE (dyspnea on exertion)   . Nonischemic cardiomyopathy ([place])   . Anemia of chronic disease   . Type 2 diabetes mellitus ([place])   . Iron deficiency anemia   . Allergic rhinitis 10/14/2009  . Essential hypertension 05/12/2009  . H/O atrial tachycardia 05/12/2009  . Asthma 05/12/2009   Past Medical History:  Diagnosis Date  . Abnormally small mouth   . Allergic rhinitis 10/14/2009   Qualifier: Diagnosis of  By: Lamonte Sakai MD, Rose Fillers   Overview:  Overview:  Qualifier: Diagnosis of  By: Lamonte Sakai MD, Rose Fillers  Last Assessment & Plan:  Please continue Xyzal and Nasacort as you have been using them  . Anemia   . Asthma 05/12/2009   10/12/2014 p extensive coaching HFA effectiveness =    75% s spacer    Overview:  Overview:  10/12/2014 p extensive coaching HFA effectiveness =    75% s spacer   Last Assessment & Plan:  Please continue Symbicort 2 puffs twice a day. Remember to rinse and gargle after taking this medication. Take albuterol 2 puffs up to every 4 hours if needed for shortness of breath.  Follow with Dr Lamonte Sakai in 6 month  . Bilateral carotid artery stenosis    a. mild 1-39% by duplex 10/2017, due 2021.  . Breast cancer ([place]) 01/18/2016   right breast  . CAD (coronary artery disease) 01/24/2011   a. s/p CABG 1998 with redo 2009.  Marland Kitchen Chemotherapy induced nausea and vomiting 02/24/2016  . Chemotherapy-induced peripheral neuropathy ([place]) 04/27/2016  . 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 Chronic diastolic CHF (congestive heart failure) ([place]) 09/24/2013  . CKD (chronic kidney disease), stage IV ([place]) 09/24/2013  . Closed fracture of head of left humerus 09/09/2017  . Complication of anesthesia   . Dysrhythmia    a-fib  . Gallstones   . GERD (gastroesophageal reflux disease)   . Glaucoma   . H/O  atrial tachycardia 05/12/2009   Qualifier: History of  By: Lamonte Sakai MD, Rose Fillers   Overview:  Overview:  Qualifier: History of  By: Lamonte Sakai MD, Rose Fillers  Last Assessment & Plan:  There was no evidence of this on her cardiac monitor. I would recommend watchful waiting.   Marland Kitchen Heart murmur   . History of kidney stones   . History of non-ST elevation myocardial infarction (NSTEMI)    Sept 2014--  thought to be type II HTN w/ LHC without infarct related artery and patent grafts  . History of radiation therapy 05/24/16-07/26/16   right breast 50.4 Gy in 28 fractions, right breast boost 10 Gy in 5  fractions  . Hyperlipidemia   . Hypotension   . Iron deficiency anemia   . Mania ([place]) 03/05/2016  . Moderate persistent asthma    pulmologist-  Dr. Malvin Johns  . Osteomyelitis of toe of left foot ([place]) 04/06/2016  . Osteopenia of multiple sites 10/19/2015  . PAF (paroxysmal atrial fibrillation) ([place])   . Personal history of chemotherapy 2017  . Personal history of radiation therapy 2017  . PONV (postoperative nausea and vomiting)   . Port catheter in place 02/17/2016  . Psoriasis    right leg  . Renal calculus, right   . S/P AVR    prosthesis valve placement 2009 at same time re-do CABG  . Seizure-like activity ([place]) 09/01/2017  . Sensorineural hearing loss (SNHL) of both ears 01/06/2016  . Stroke [facility]) 2014   residual rt hearing loss  . Syncope 09/06/2017  . Type 2 diabetes mellitus ([place])    monitored by dr Dwyane Dee    Family History  Problem Relation Age of Onset  . Heart disease Father   . Heart failure Father   . Diabetes Maternal Grandmother   . Heart disease Maternal Grandmother   . Diabetes Son   . Healthy Brother        #1  . Heart attack Brother        #2  . Heart disease Brother        #2  . Colon cancer Neg Hx   . Esophageal cancer Neg Hx     Past Surgical History:  Procedure Laterality Date  . AORTIC VALVE REPLACEMENT  2009   #73mm [facility] Ease pericardial valve (done same time is  CABG)  . BREAST BIOPSY Right 02/12/2019   2 lymphnodes, 1 breast  . BREAST LUMPECTOMY Right 01/18/2016  . BREAST LUMPECTOMY WITH RADIOACTIVE SEED AND SENTINEL LYMPH NODE BIOPSY Right 01/18/2016   Procedure: RIGHT BREAST LUMPECTOMY WITH RADIOACTIVE SEED AND SENTINEL LYMPH NODE BIOPSY;  Surgeon: Alphonsa Overall, MD;  Location: [facility];  Service: General;  Laterality: Right;  . CARDIAC CATHETERIZATION  03/23/2008   Pre-redo CABG: L main OK, LAD (T), CFX (T), OM1 99%, RCA (T), LIMA-LAD OK, SVG-OM(?3) OK w/ little florw to OM2, SVG-RCA OK. EF NL  . CARPAL TUNNEL RELEASE    . CHEST TUBE INSERTION Right 06/26/2019   Procedure: INSERTION PLEURAL DRAINAGE CATHETER;  Surgeon: Gaye Pollack, MD;  Location: [facility];  Service: Thoracic;  Laterality: Right;  . CHOLECYSTECTOMY N/A 07/07/2018   Procedure: LAPAROSCOPIC CHOLECYSTECTOMY WITH INTRAOPERATIVE CHOLANGIOGRAM ERAS PATHWAY;  Surgeon: Alphonsa Overall, MD;  Location: [facility];  Service: General;  Laterality: N/A;  . COLONOSCOPY     around 2015. Possibly with [facility]   . CORONARY ARTERY BYPASS GRAFT  1998 &  re-do 2009   Had LIMA to DX/LAD, SVG to 2 marginal branches and SVG to [facility] originally; SVG to 3rd OM and PD at time of redo  . CYSTOSCOPY W/ URETERAL STENT PLACEMENT Right 12/20/2014   Procedure: CYSTOSCOPY WITH RETROGRADE PYELOGRAM/URETERAL STENT PLACEMENT;  Surgeon: Cleon Gustin, MD;  Location: [facility];  Service: Urology;  Laterality: Right;  . CYSTOSCOPY WITH RETROGRADE PYELOGRAM, URETEROSCOPY AND STENT PLACEMENT Left 08/21/2019   Procedure: CYSTOSCOPY WITH RETROGRADE PYELOGRAM, URETEROSCOPY AND STENT PLACEMENT;  Surgeon: Cleon Gustin, MD;  Location: [facility];  Service: Urology;  Laterality: Left;  1 HR  . ESOPHAGOGASTRODUODENOSCOPY     many years ago per patient   . ESOPHAGOGASTRODUODENOSCOPY ENDOSCOPY  06/17/2018  . EYE SURGERY  Bilateral    cataracts  . HOLMIUM LASER APPLICATION Right 2/44/0102   Procedure:  HOLMIUM LASER  LITHOTRIPSY;  Surgeon: Cleon Gustin, MD;  Location: [facility];  Service: Urology;  Laterality: Right;  . HOLMIUM LASER APPLICATION Left 01/03/3663   Procedure: HOLMIUM LASER APPLICATION;  Surgeon: Cleon Gustin, MD;  Location: [facility];  Service: Urology;  Laterality: Left;  . IR KYPHO LUMBAR INC FX REDUCE BONE BX UNI/BIL CANNULATION INC/IMAGING  02/12/2020  . IR THORACENTESIS ASP PLEURAL SPACE W/IMG GUIDE  10/03/2018  . IR THORACENTESIS ASP PLEURAL SPACE W/IMG GUIDE  06/08/2019  . LEFT HEART CATHETERIZATION WITH CORONARY/GRAFT ANGIOGRAM N/A 02/23/2013   Procedure: LEFT HEART CATHETERIZATION WITH Beatrix Fetters;  Surgeon: Blane Ohara, MD;  Location: [facility] C[facility] LAB;  Service: Cardiovascular;  Laterality: N/A;  . LOOP RECORDER INSERTION N/A 08/30/2017   Procedure: LOOP RECORDER INSERTION;  Surgeon: Evans Lance, MD;  Location: [facility] CV LAB;  Service: Cardiovascular;  Laterality: N/A;  . PORTACATH PLACEMENT Left 01/18/2016   Procedure: INSERTION PORT-A-CATH;  Surgeon: Alphonsa Overall, MD;  Location: [facility];  Service: General;  Laterality: Left;  . portacath removal    . REMOVAL OF PLEURAL DRAINAGE CATHETER Right 07/14/2019   Procedure: REMOVAL OF PLEURAL DRAINAGE CATHETER;  Surgeon: Gaye Pollack, MD;  Location: [facility];  Service: Thoracic;  Laterality: Right;  . TALC PLEURODESIS N/A 06/26/2019   Procedure: Pietro Cassis;  Surgeon: Gaye Pollack, MD;  Location: [facility];  Service: Thoracic;  Laterality: N/A;  . TONSILLECTOMY    . TRANSTHORACIC ECHOCARDIOGRAM  02-24-2013      mild LVH,  ef 50-55%/  AV bioprosthesis was present with very mild stenosis and no regurg., mean grandient 50mmHg, peak grandient 76mmHg /  mild MR/  mild LAE and RAE/  moderate TR  . TUBAL LIGATION     Social History   Occupational History  . Occupation: retired  Tobacco Use  . Smoking status: Never Smoker  . Smokeless tobacco: Never Used  Vaping Use  . Vaping Use: Never used    Substance and Sexual Activity  . Alcohol use: No  . Drug use: No  . Sexual activity: Yes    Birth control/protection: None

## 2020-02-26 ENCOUNTER — Other Ambulatory Visit: Payer: Medicare Other

## 2020-02-26 ENCOUNTER — Inpatient Hospital Stay: Payer: Medicare Other | Attending: Adult Health

## 2020-02-26 ENCOUNTER — Other Ambulatory Visit: Payer: Self-pay

## 2020-02-26 ENCOUNTER — Encounter: Payer: Self-pay | Admitting: Adult Health

## 2020-02-26 ENCOUNTER — Inpatient Hospital Stay: Payer: Medicare Other

## 2020-02-26 ENCOUNTER — Inpatient Hospital Stay (HOSPITAL_BASED_OUTPATIENT_CLINIC_OR_DEPARTMENT_OTHER): Payer: Medicare Other | Admitting: Adult Health

## 2020-02-26 VITALS — BP 112/60 | HR 72 | Resp 18

## 2020-02-26 DIAGNOSIS — Z923 Personal history of irradiation: Secondary | ICD-10-CM | POA: Insufficient documentation

## 2020-02-26 DIAGNOSIS — Z803 Family history of malignant neoplasm of breast: Secondary | ICD-10-CM | POA: Diagnosis not present

## 2020-02-26 DIAGNOSIS — Z170 Estrogen receptor positive status [ER+]: Secondary | ICD-10-CM | POA: Insufficient documentation

## 2020-02-26 DIAGNOSIS — Z79811 Long term (current) use of aromatase inhibitors: Secondary | ICD-10-CM | POA: Diagnosis not present

## 2020-02-26 DIAGNOSIS — D696 Thrombocytopenia, unspecified: Secondary | ICD-10-CM | POA: Diagnosis not present

## 2020-02-26 DIAGNOSIS — I129 Hypertensive chronic kidney disease with stage 1 through stage 4 chronic kidney disease, or unspecified chronic kidney disease: Secondary | ICD-10-CM | POA: Diagnosis not present

## 2020-02-26 DIAGNOSIS — Z79899 Other long term (current) drug therapy: Secondary | ICD-10-CM | POA: Insufficient documentation

## 2020-02-26 DIAGNOSIS — Z9221 Personal history of antineoplastic chemotherapy: Secondary | ICD-10-CM | POA: Insufficient documentation

## 2020-02-26 DIAGNOSIS — I4891 Unspecified atrial fibrillation: Secondary | ICD-10-CM | POA: Insufficient documentation

## 2020-02-26 DIAGNOSIS — K219 Gastro-esophageal reflux disease without esophagitis: Secondary | ICD-10-CM | POA: Diagnosis not present

## 2020-02-26 DIAGNOSIS — C50211 Malignant neoplasm of upper-inner quadrant of right female breast: Secondary | ICD-10-CM

## 2020-02-26 DIAGNOSIS — D638 Anemia in other chronic diseases classified elsewhere: Secondary | ICD-10-CM

## 2020-02-26 DIAGNOSIS — R634 Abnormal weight loss: Secondary | ICD-10-CM | POA: Insufficient documentation

## 2020-02-26 DIAGNOSIS — D631 Anemia in chronic kidney disease: Secondary | ICD-10-CM | POA: Diagnosis not present

## 2020-02-26 DIAGNOSIS — I5043 Acute on chronic combined systolic (congestive) and diastolic (congestive) heart failure: Secondary | ICD-10-CM | POA: Diagnosis not present

## 2020-02-26 DIAGNOSIS — Z8781 Personal history of (healed) traumatic fracture: Secondary | ICD-10-CM | POA: Insufficient documentation

## 2020-02-26 DIAGNOSIS — N184 Chronic kidney disease, stage 4 (severe): Secondary | ICD-10-CM | POA: Insufficient documentation

## 2020-02-26 LAB — CMP (CANCER CENTER ONLY)
ALT: 36 U/L (ref 0–44)
AST: 43 U/L — ABNORMAL HIGH (ref 15–41)
Albumin: 2.7 g/dL — ABNORMAL LOW (ref 3.5–5.0)
Alkaline Phosphatase: 91 U/L (ref 38–126)
Anion gap: 7 (ref 5–15)
BUN: 35 mg/dL — ABNORMAL HIGH (ref 8–23)
CO2: 25 mmol/L (ref 22–32)
Calcium: 9.1 mg/dL (ref 8.9–10.3)
Chloride: 107 mmol/L (ref 98–111)
Creatinine: 2.83 mg/dL — ABNORMAL HIGH (ref 0.44–1.00)
GFR, Est AFR Am: 18 mL/min — ABNORMAL LOW (ref >60)
GFR, Estimated: 16 mL/min — ABNORMAL LOW (ref >60)
Glucose, Bld: 171 mg/dL — ABNORMAL HIGH (ref 70–99)
Potassium: 4.6 mmol/L (ref 3.5–5.1)
Sodium: 139 mmol/L (ref 135–145)
Total Bilirubin: 0.6 mg/dL (ref 0.3–1.2)
Total Protein: 6.3 g/dL — ABNORMAL LOW (ref 6.5–8.1)

## 2020-02-26 LAB — RETIC PANEL
Immature Retic Fract: 8 % (ref 2.3–15.9)
RBC.: 3.18 MIL/uL — ABNORMAL LOW (ref 3.87–5.11)
Retic Count, Absolute: 29.9 10*3/uL (ref 19.0–186.0)
Retic Ct Pct: 0.9 % (ref 0.4–3.1)
Reticulocyte Hemoglobin: 36.2 pg (ref >27.9)

## 2020-02-26 LAB — CBC WITH DIFFERENTIAL (CANCER CENTER ONLY)
Abs Immature Granulocytes: 0.01 10*3/uL (ref 0.00–0.07)
Basophils Absolute: 0.1 10*3/uL (ref 0.0–0.1)
Basophils Relative: 1 %
Eosinophils Absolute: 0.2 10*3/uL (ref 0.0–0.5)
Eosinophils Relative: 4 %
HCT: 31.2 % — ABNORMAL LOW (ref 36.0–46.0)
Hemoglobin: 9.7 g/dL — ABNORMAL LOW (ref 12.0–15.0)
Immature Granulocytes: 0 %
Lymphocytes Relative: 9 %
Lymphs Abs: 0.6 10*3/uL — ABNORMAL LOW (ref 0.7–4.0)
MCH: 30.1 pg (ref 26.0–34.0)
MCHC: 31.1 g/dL (ref 30.0–36.0)
MCV: 96.9 fL (ref 80.0–100.0)
Monocytes Absolute: 0.5 10*3/uL (ref 0.1–1.0)
Monocytes Relative: 8 %
Neutro Abs: 5.1 10*3/uL (ref 1.7–7.7)
Neutrophils Relative %: 78 %
Platelet Count: 118 10*3/uL — ABNORMAL LOW (ref 150–400)
RBC: 3.22 MIL/uL — ABNORMAL LOW (ref 3.87–5.11)
RDW: 15.6 % — ABNORMAL HIGH (ref 11.5–15.5)
WBC Count: 6.5 10*3/uL (ref 4.0–10.5)
nRBC: 0 % (ref 0.0–0.2)

## 2020-02-26 LAB — SAMPLE TO BLOOD BANK

## 2020-02-26 IMAGING — CT CT ABD-PELV W/O CM
2 of 4 series · 16 of 46 positions shown, 18 images · non-contrast
Comparison: Ultrasound 05/31/2018. Abdominopelvic CT 12/07/2014.
Chest CT of 05/21/2018.

CLINICAL DATA: Mid abdominal postprandial pain for 1 month with
nausea and vomiting. History of right breast cancer with radiation
and chemotherapy.

EXAM:
CT ABDOMEN AND PELVIS WITHOUT CONTRAST
TECHNIQUE: Multidetector CT imaging of the abdomen and pelvis was performed
following the standard protocol without IV contrast.

[Series 2: axial st · axial · 0.77mm/px · z∈[-660,-255]mm · 13 of 93 slices shown, 15 images]
[im 6/93  soft-tissue]
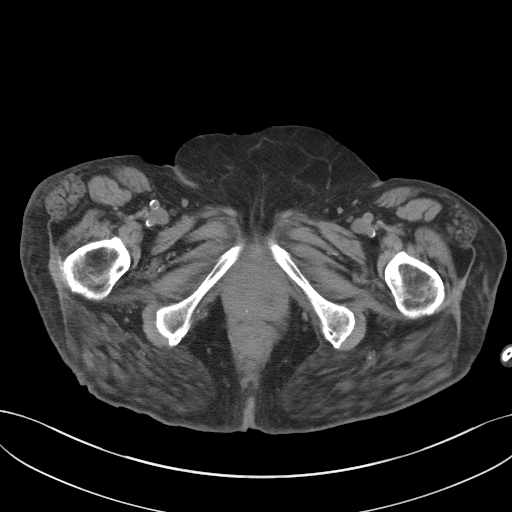
[im 6/93  bone]
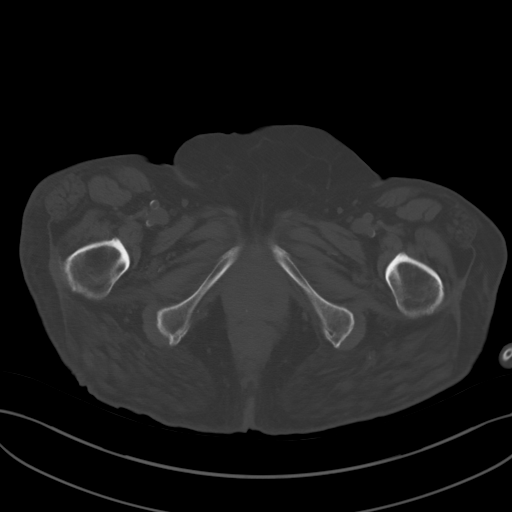
[im 11/93  soft-tissue]
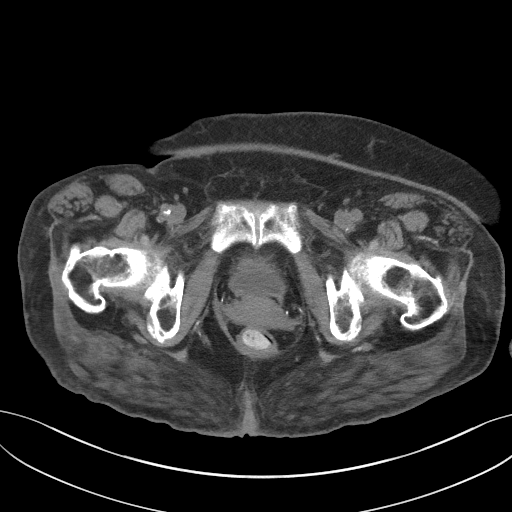
[im 22/93  soft-tissue]
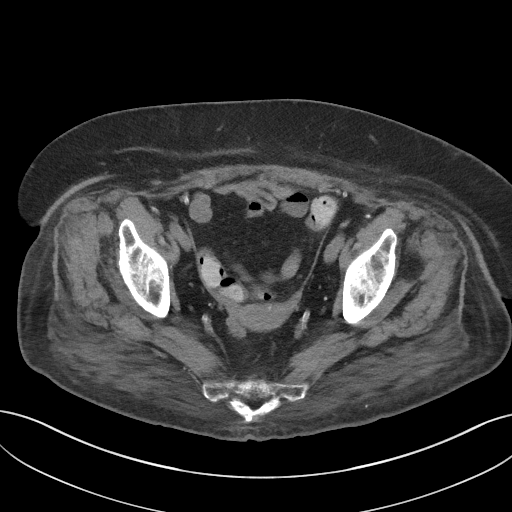
[im 28/93  soft-tissue]
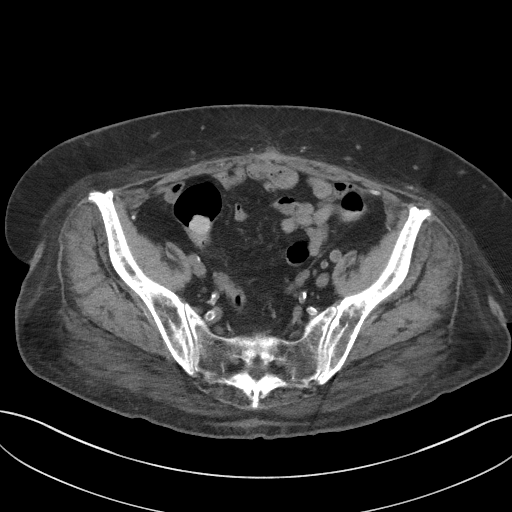
[im 33/93  soft-tissue]
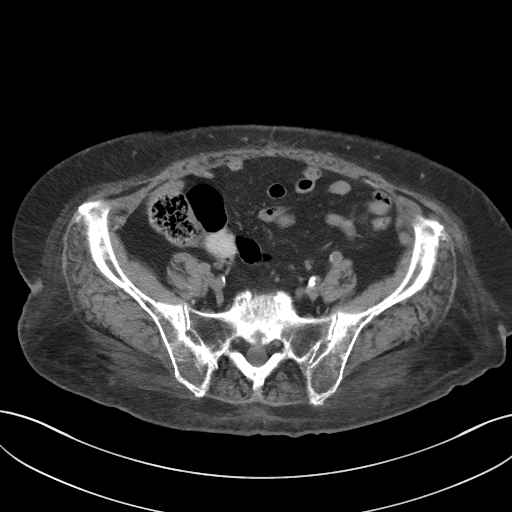
[im 38/93  soft-tissue]
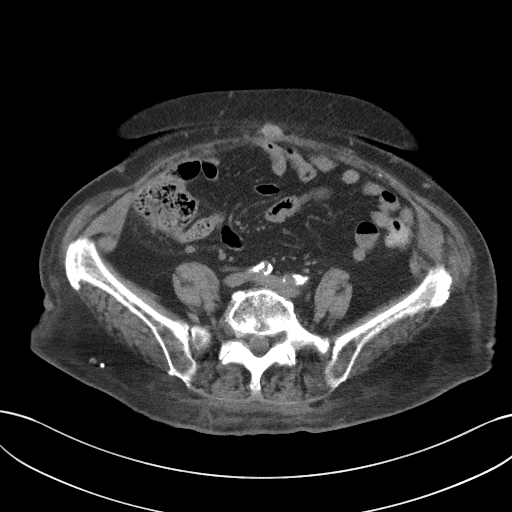
[im 49/93  soft-tissue]
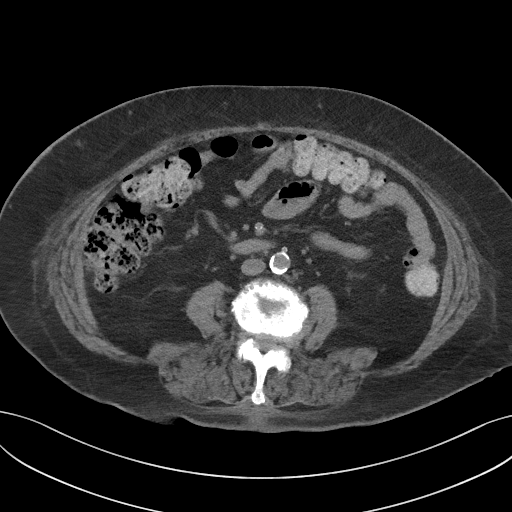
[im 55/93  soft-tissue]
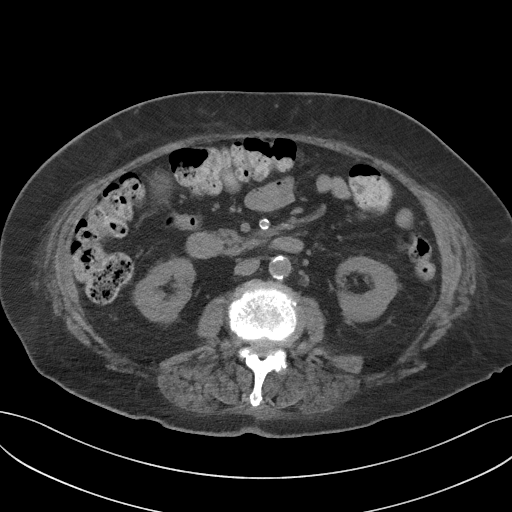
[im 60/93  soft-tissue]
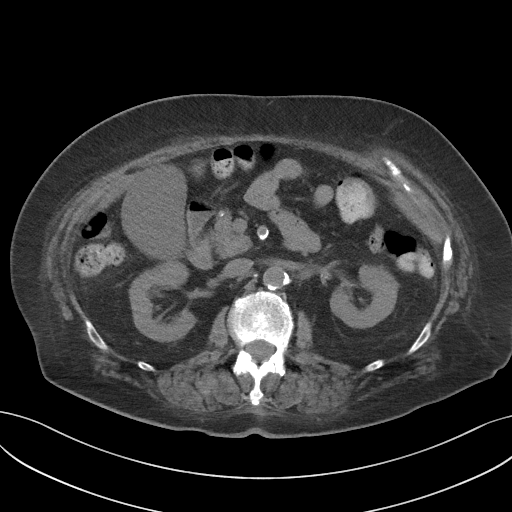
[im 60/93  bone]
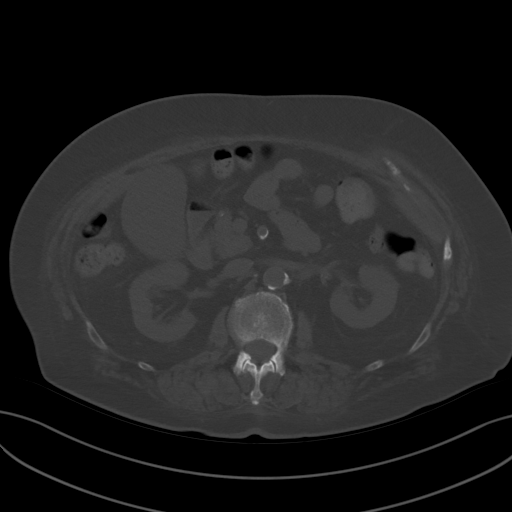
[im 65/93  soft-tissue]
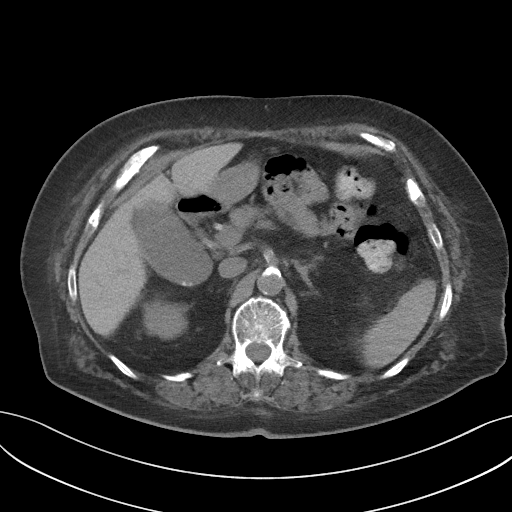
[im 71/93  soft-tissue]
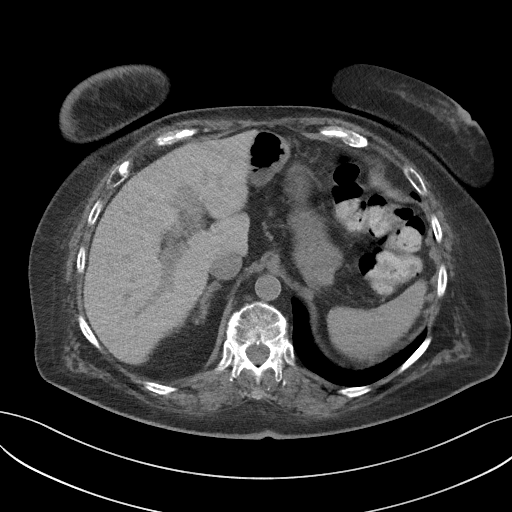
[im 82/93  soft-tissue]
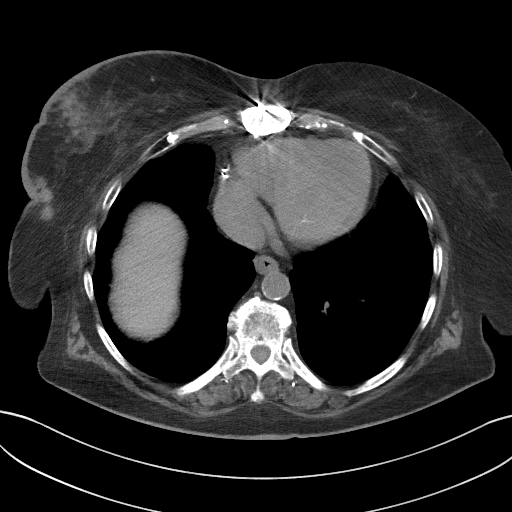
[im 87/93  soft-tissue]
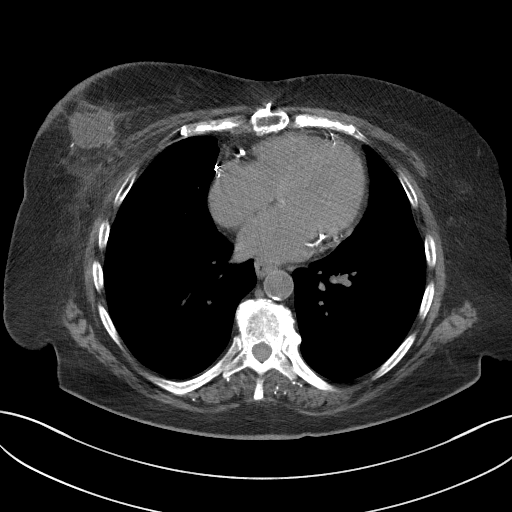

[Series 4: coronal st · coronal · 0.96mm/px · 3 of 96 slices shown]
[im 32/96  soft-tissue]
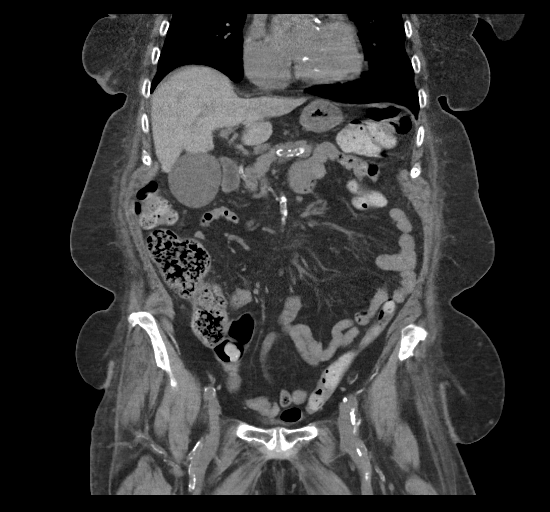
[im 43/96  soft-tissue]
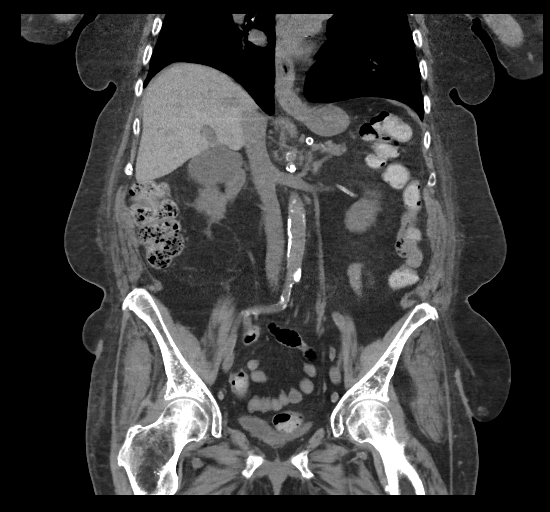
[im 53/96  soft-tissue]
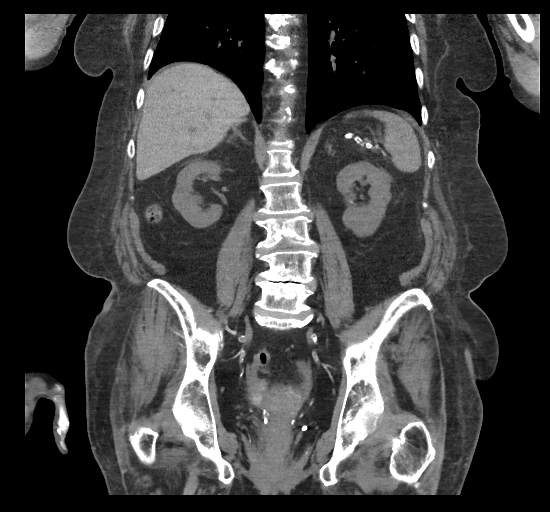

[16 of 46 positions shown; findings below may reference images not displayed]

FINDINGS: Lower chest: Mild dependent atelectasis or scarring in the right
lower lobe. The lung bases are otherwise clear. No significant
pleural or pericardial effusion. Extensive atherosclerosis of the
aortic root and coronary arteries status post median sternotomy, AVR
and CABG. Right breast fluid collection measuring 3.4 x 2.9 cm on
image [DATE], similar to recent chest CT.

Hepatobiliary: The liver appears unremarkable as imaged in the
noncontrast state. The gallbladder is mildly distended. There are
small dependent gallstones. No gallbladder wall thickening,
surrounding inflammation or biliary ductal dilatation.

Pancreas: Stable mild atrophy without focal abnormality or
surrounding inflammation.

Spleen: Normal in size without focal abnormality.

Adrenals/Urinary Tract: Both adrenal glands appear normal. The
kidneys, ureters and bladder appear normal. There is no evidence of
urinary tract calculus or hydronephrosis. Bilateral pelvic
phleboliths are stable.

Stomach/Bowel: No evidence of bowel wall thickening, distention or
surrounding inflammatory change. The appendix appears normal.

Vascular/Lymphatic: There are no enlarged abdominal or pelvic lymph
nodes. Extensive aortic and branch vessel atherosclerosis.

Reproductive: Stable uterine vascular calcifications. No adnexal
mass.

Other: Postsurgical changes in the low anterior abdominal wall. No
ascites. Probable pelvic floor laxity.

Musculoskeletal: New irregularities of the superior and inferior
endplates of L3 compared with 1996 CT, not grossly changed from
radiographs 10/03/2016 and consistent with nonacute Schmorl's nodes.
There is multilevel spondylosis with possible symptomatic spinal
stenosis at L2-3 and L3-4.
IMPRESSION: 1. No acute findings or explanation for the patient's symptoms.
2. Cholelithiasis with stable mild gallbladder distension, but no
gallbladder wall thickening or surrounding inflammation.
3. Stable postsurgical fluid collection in the right breast. No
evidence of metastatic disease.
4. No evidence of recurrent urinary tract calculus or
hydronephrosis.
5.  Extensive aortic Atherosclerosis ([id number]).
6. Lower lumbar spondylosis.

## 2020-02-26 MED ORDER — DARBEPOETIN ALFA 500 MCG/ML IJ SOSY
PREFILLED_SYRINGE | INTRAMUSCULAR | Status: AC
  Filled 2020-02-26: qty 1

## 2020-02-26 MED ORDER — DARBEPOETIN ALFA 500 MCG/ML IJ SOSY
500.0000 ug | PREFILLED_SYRINGE | Freq: Once | INTRAMUSCULAR | Status: AC
  Administered 2020-02-26: 500 ug via SUBCUTANEOUS

## 2020-02-26 NOTE — Progress Notes (Signed)
[facility] Follow up:    Hannah [name], [place] [address]   DIAGNOSIS: Cancer Staging Breast cancer of upper-inner quadrant of right female breast [facility]) Staging form: Breast, AJCC 7th Edition - Clinical stage from 12/21/2015: Stage IA (T1c, N0, M0) - Unsigned Staging comments: Staged at breast conference on 7.12.17 - Pathologic: Stage IA (T1c, N0, cM0) - Unsigned   SUMMARY OF ONCOLOGIC HISTORY: Oncology History  Breast cancer of upper-inner quadrant of right female breast ([place])  12/14/2015 Initial Diagnosis   Right breast biopsy 12:30 position: 2 masses, 1.6 cm mass: Invasive ductal carcinoma, grade 2, ER 0%, PR 0%, HER-2 negative, Ki-67 70%; satellite mass 8 mm: IDC grade 2 ER 5%, PR 5%, HER-2 negative, Ki-67 50%; T1cN0 stage IA clinical stage   01/18/2016 Surgery   Right lumpectomy: Multifocal IDC grade 3, 1.9 cm  ER 0%, PR 0%, HER-2 negative, Ki-67 70% and 0.8 cm satellite mass ER 5%, PR 2%, HER-2 negative, Ki-67 50%, high-grade DCIS, margins negative, 0/1 lymph nodes negative T1 cN0 stage IA   02/17/2016 - 04/06/2016 Chemotherapy   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    04/08/2016 - 04/23/2016 Hospital Admission   Hosp adm for cellulitis   05/24/2016 - 07/25/2016 Radiation Therapy    Adj XRT   06/29/2016 - 07/04/2016 Hospital Admission   Seizure like activity, MRI brain and EEG unremarkable    08/16/2016 -  Anti-estrogen oral therapy   Letrozole could not tolerate it due to dizziness and lightheadedness, switched to anastrozole 04/04/2017 switched to exemestane 05/22/2017     CURRENT THERAPY: Exemestane/IV iron/Retacrit  INTERVAL HISTORY: Hannah [name] 74 y.o. female returns for evaluation of her h/o breast cancer on treatment with Exemestane and her anemia.  She is here today for f/u.  She most recently underwent a mammogram of her right breast that noted a 2.8 cm right breast mass that  was forming.  The right axilla was negative.  Her mammogram in 02/2019 showed a right breast mass, and that was biopsied and demonstrated fat necrosis.  She notes that she   Her most recent Aranesp injection was on 01/15/2020 and she received IV venofer weekly, completing treatment on 01/22/2020.  Hannah [name] unfortunately fell several weeks ago and suffered a compression fracture.  She underwent a kyphoplasty and biopsy.  Her pain is much improved.  Her activity is increasing, and she is regaining more activity and mobility.  The biopsy was negative for cancer.    Hannah [name] has lost 9 pounds in the past several weeks.  She notes she eats twice a day, whatever she wants and feels full.  Her husband things she doesn't get enough to eat.     Patient Active Problem List   Diagnosis Date Noted  . Compression fracture of L1 vertebra with routine healing 02/25/2020  . Compression fracture of L1 lumbar vertebra, closed, initial encounter ([place]) 01/27/2020  . Pain in left shoulder 03/26/2019  . History of breast cancer in female 03/26/2019  . Hypokalemia 02/20/2019  . Acute exacerbation of CHF (congestive heart failure) ([place]) 02/19/2019  . Pleural effusion on right 09/25/2018  . Thrombocytopenia ([place]) 09/25/2018  . Cholecystitis with cholelithiasis 07/07/2018  . Prolonged Q-T interval on ECG 05/21/2018  . Atrial fibrillation ([place]) 01/23/2018  . Closed fracture of head of left humerus 09/09/2017  . Syncope 09/06/2017  . Seizure-like activity ([place]) 09/01/2017  . AKI (acute kidney injury) ([place]) 09/01/2017  .  Adrenal insufficiency (HCC) 07/03/2016  . Seizure (HCC) 06/29/2016  . Chemotherapy-induced peripheral neuropathy (HCC) 04/27/2016  . 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 Osteomyelitis of toe of left foot (HCC) 04/06/2016  . Mania (HCC) 03/05/2016  . GERD (gastroesophageal reflux disease) 02/29/2016  . Chemotherapy induced nausea and vomiting 02/24/2016  . Port catheter in place  02/17/2016  . Sensorineural hearing loss (SNHL) of both ears 01/06/2016  . Breast cancer of upper-inner quadrant of right female breast (HCC) 12/16/2015  . Osteopenia of multiple sites 10/19/2015  . CKD (chronic kidney disease), stage IV (HCC) 09/24/2013  . Chronic diastolic CHF (congestive heart failure) (HCC) 09/24/2013  . Acute on chronic combined systolic and diastolic congestive heart failure (HCC) 07/17/2013  . Atrial tachycardia (HCC) 04/03/2013  . CAD (coronary artery disease) 01/24/2011  . Chronic coronary artery disease 01/24/2011  . History of aortic valve replacement 10/27/2010  . Hyperlipidemia   . DOE (dyspnea on exertion)   . Nonischemic cardiomyopathy (HCC)   . Anemia of chronic disease   . Type 2 diabetes mellitus (HCC)   . Iron deficiency anemia   . Allergic rhinitis 10/14/2009  . Essential hypertension 05/12/2009  . H/O atrial tachycardia 05/12/2009  . Asthma 05/12/2009    is allergic to amoxicillin, tape, aldactone [spironolactone], arimidex [anastrozole], latex, and tetracycline.  MEDICAL HISTORY: Past Medical History:  Diagnosis Date  . Abnormally small mouth   . Allergic rhinitis 10/14/2009   Qualifier: Diagnosis of  By: Delton Coombes MD, Les Pou   Overview:  Overview:  Qualifier: Diagnosis of  By: Delton Coombes MD, Les Pou  Last Assessment & Plan:  Please continue Xyzal and Nasacort as you have been using them  . Anemia   . Asthma 05/12/2009   10/12/2014 p extensive coaching HFA effectiveness =    75% s spacer    Overview:  Overview:  10/12/2014 p extensive coaching HFA effectiveness =    75% s spacer   Last Assessment & Plan:  Please continue Symbicort 2 puffs twice a day. Remember to rinse and gargle after taking this medication. Take albuterol 2 puffs up to every 4 hours if needed for shortness of breath.  Follow with Dr Delton Coombes in 6 month  . Bilateral carotid artery stenosis    a. mild 1-39% by duplex 10/2017, due 2021.  . Breast cancer (HCC) 01/18/2016   right breast  . CAD  (coronary artery disease) 01/24/2011   a. s/p CABG 1998 with redo 2009.  Marland Kitchen Chemotherapy induced nausea and vomiting 02/24/2016  . Chemotherapy-induced peripheral neuropathy (HCC) 04/27/2016  . 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 Chronic diastolic CHF (congestive heart failure) (HCC) 09/24/2013  . CKD (chronic kidney disease), stage IV (HCC) 09/24/2013  . Closed fracture of head of left humerus 09/09/2017  . Complication of anesthesia   . Dysrhythmia    a-fib  . Gallstones   . GERD (gastroesophageal reflux disease)   . Glaucoma   . H/O atrial tachycardia 05/12/2009   Qualifier: History of  By: Delton Coombes MD, Les Pou   Overview:  Overview:  Qualifier: History of  By: Delton Coombes MD, Les Pou  Last Assessment & Plan:  There was no evidence of this on her cardiac monitor. I would recommend watchful waiting.   Marland Kitchen Heart murmur   . History of kidney stones   . History of non-ST elevation myocardial infarction (NSTEMI)    Sept 2014--  thought to be type II HTN w/ LHC without infarct related artery and patent grafts  . History of radiation  therapy 05/24/16-07/26/16   right breast 50.4 Gy in 28 fractions, right breast boost 10 Gy in 5 fractions  . Hyperlipidemia   . Hypotension   . Iron deficiency anemia   . Mania ([place]) 03/05/2016  . Moderate persistent asthma    pulmologist-  Dr. Malvin Johns  . Osteomyelitis of toe of left foot ([place]) 04/06/2016  . Osteopenia of multiple sites 10/19/2015  . PAF (paroxysmal atrial fibrillation) ([place])   . Personal history of chemotherapy 2017  . Personal history of radiation therapy 2017  . PONV (postoperative nausea and vomiting)   . Port catheter in place 02/17/2016  . Psoriasis    right leg  . Renal calculus, right   . S/P AVR    prosthesis valve placement 2009 at same time re-do CABG  . Seizure-like activity ([place]) 09/01/2017  . Sensorineural hearing loss (SNHL) of both ears 01/06/2016  . Stroke [facility]) 2014   residual rt hearing loss  . Syncope 09/06/2017  . Type  2 diabetes mellitus ([place])    monitored by dr Dwyane Dee    SURGICAL HISTORY: Past Surgical History:  Procedure Laterality Date  . AORTIC VALVE REPLACEMENT  2009   #27mm [facility] Ease pericardial valve (done same time is CABG)  . BREAST BIOPSY Right 02/12/2019   2 lymphnodes, 1 breast  . BREAST LUMPECTOMY Right 01/18/2016  . BREAST LUMPECTOMY WITH RADIOACTIVE SEED AND SENTINEL LYMPH NODE BIOPSY Right 01/18/2016   Procedure: RIGHT BREAST LUMPECTOMY WITH RADIOACTIVE SEED AND SENTINEL LYMPH NODE BIOPSY;  Surgeon: Alphonsa Overall, MD;  Location: [place];  Service: General;  Laterality: Right;  . CARDIAC CATHETERIZATION  03/23/2008   Pre-redo CABG: L main OK, LAD (T), CFX (T), OM1 99%, RCA (T), LIMA-LAD OK, SVG-OM(?3) OK w/ little florw to OM2, SVG-RCA OK. EF NL  . CARPAL TUNNEL RELEASE    . CHEST TUBE INSERTION Right 06/26/2019   Procedure: INSERTION PLEURAL DRAINAGE CATHETER;  Surgeon: Gaye Pollack, MD;  Location: [facility];  Service: Thoracic;  Laterality: Right;  . CHOLECYSTECTOMY N/A 07/07/2018   Procedure: LAPAROSCOPIC CHOLECYSTECTOMY WITH INTRAOPERATIVE CHOLANGIOGRAM ERAS PATHWAY;  Surgeon: Alphonsa Overall, MD;  Location: [facility];  Service: General;  Laterality: N/A;  . COLONOSCOPY     around 2015. Possibly with [facility]   . CORONARY ARTERY BYPASS GRAFT  1998 &  re-do 2009   Had LIMA to DX/LAD, SVG to 2 marginal branches and SVG to [facility] originally; SVG to 3rd OM and PD at time of redo  . CYSTOSCOPY W/ URETERAL STENT PLACEMENT Right 12/20/2014   Procedure: CYSTOSCOPY WITH RETROGRADE PYELOGRAM/URETERAL STENT PLACEMENT;  Surgeon: Cleon Gustin, MD;  Location: [facility];  Service: Urology;  Laterality: Right;  . CYSTOSCOPY WITH RETROGRADE PYELOGRAM, URETEROSCOPY AND STENT PLACEMENT Left 08/21/2019   Procedure: CYSTOSCOPY WITH RETROGRADE PYELOGRAM, URETEROSCOPY AND STENT PLACEMENT;  Surgeon: Cleon Gustin, MD;  [facility];  Service: Urology;  Laterality: Left;  1 HR  .  ESOPHAGOGASTRODUODENOSCOPY     many years ago per patient   . ESOPHAGOGASTRODUODENOSCOPY ENDOSCOPY  06/17/2018  . EYE SURGERY Bilateral    cataracts  . HOLMIUM LASER APPLICATION Right 3/61/4431   Procedure:  HOLMIUM LASER LITHOTRIPSY;  Surgeon: Cleon Gustin, MD;  Location: [facility];  Service: Urology;  Laterality: Right;  . HOLMIUM LASER APPLICATION Left 5/40/0867   Procedure: HOLMIUM LASER APPLICATION;  Surgeon: Cleon Gustin, MD;  [facility]: [facility];  Service: Urology;  Laterality: Left;  . IR KYPHO LUMBAR INC  FX REDUCE BONE BX UNI/BIL CANNULATION INC/IMAGING  02/12/2020  . IR THORACENTESIS ASP PLEURAL SPACE W/IMG GUIDE  10/03/2018  . IR THORACENTESIS ASP PLEURAL SPACE W/IMG GUIDE  06/08/2019  . LEFT HEART CATHETERIZATION WITH CORONARY/GRAFT ANGIOGRAM N/A 02/23/2013   Procedure: LEFT HEART CATHETERIZATION WITH Beatrix Fetters;  Surgeon: Blane Ohara, MD;  Location: [facility] CATH LAB;  Service: Cardiovascular;  Laterality: N/A;  . LOOP RECORDER INSERTION N/A 08/30/2017   Procedure: LOOP RECORDER INSERTION;  Surgeon: Evans Lance, MD;  Location: [facility] CV LAB;  Service: Cardiovascular;  Laterality: N/A;  . PORTACATH PLACEMENT Left 01/18/2016   Procedure: INSERTION PORT-A-CATH;  Surgeon: Alphonsa Overall, MD;  Location: [facility];  Service: General;  Laterality: Left;  . portacath removal    . REMOVAL OF PLEURAL DRAINAGE CATHETER Right 07/14/2019   Procedure: REMOVAL OF PLEURAL DRAINAGE CATHETER;  Surgeon: Gaye Pollack, MD;  Location: [facility];  Service: Thoracic;  Laterality: Right;  . TALC PLEURODESIS N/A 06/26/2019   Procedure: Pietro Cassis;  Surgeon: Gaye Pollack, MD;  Location: [facility];  Service: Thoracic;  Laterality: N/A;  . TONSILLECTOMY    . TRANSTHORACIC ECHOCARDIOGRAM  02-24-2013      mild LVH,  ef 50-55%/  AV bioprosthesis was present with very mild stenosis and no regurg., mean grandient 68mHg, peak grandient 280mg /  mild MR/  mild LAE and RAE/   moderate TR  . TUBAL LIGATION      SOCIAL HISTORY: Social History   Socioeconomic History  . Marital status: Married    Spouse name: Not on file  . Number of children: 2  . Years of education: Not on file  . Highest education level: Not on file  Occupational History  . Occupation: retired  Tobacco Use  . Smoking status: Never Smoker  . Smokeless tobacco: Never Used  Vaping Use  . Vaping Use: Never used  Substance and Sexual Activity  . Alcohol use: No  . Drug use: No  . Sexual activity: Yes    Birth control/protection: None  Other Topics Concern  . Not on file  Social History Narrative  . Not on file   Social Determinants of Health   Financial Resource Strain:   . Difficulty of Paying Living Expenses: Not on file  Food Insecurity:   . Worried About Ru[employer]ity fundraisern the Last Year: Not on file  . Ran Out of Food in the Last Year: Not on file  Transportation Needs:   . Lack of Transportation (Medical): Not on file  . Lack of Transportation (Non-Medical): Not on file  Physical Activity:   . Days of Exercise per Week: Not on file  . Minutes of Exercise per Session: Not on file  Stress:   . Feeling of Stress : Not on file  Social Connections:   . Frequency of Communication with Friends and Family: Not on file  . Frequency of Social Gatherings with Friends and Family: Not on file  . Attends Religious Services: Not on file  . Active Member of Clubs or Organizations: Not on file  . Attends ClArchivisteetings: Not on file  . Marital Status: Not on file  Intimate Partner Violence:   . Fear of Current or Ex-Partner: Not on file  . Emotionally Abused: Not on file  . Physically Abused: Not on file  . Sexually Abused: Not on file    FAMILY HISTORY: Family History  Problem Relation Age of Onset  . Heart disease Father   .  Heart failure Father   . Diabetes Maternal Grandmother   . Heart disease Maternal Grandmother   . Diabetes Son   . Healthy  Brother        #1  . Heart attack Brother        #2  . Heart disease Brother        #2  . Colon cancer Neg Hx   . Esophageal cancer Neg Hx     Review of Systems  Constitutional: Positive for unexpected weight change. Negative for appetite change, chills, fatigue and fever.  HENT:   Negative for hearing loss, lump/mass and trouble swallowing.   Eyes: Negative for eye problems and icterus.  Respiratory: Negative for chest tightness, cough and shortness of breath.   Cardiovascular: Negative for chest pain, leg swelling and palpitations.  Gastrointestinal: Negative for abdominal distention, abdominal pain, constipation, diarrhea, nausea and vomiting.  Endocrine: Negative for hot flashes.  Musculoskeletal: Positive for back pain. Negative for arthralgias.  Skin: Negative for itching and rash.  Neurological: Negative for dizziness, extremity weakness, headaches and numbness.  Hematological: Negative for adenopathy. Does not bruise/bleed easily.  Psychiatric/Behavioral: Negative for depression. The patient is not nervous/anxious.       PHYSICAL EXAMINATION  ECOG PERFORMANCE STATUS: 2 - Symptomatic, <50% confined to bed  Vitals:   02/26/20 1307  BP: (!) 109/52  Pulse: 73  Resp: 18  Temp: 98 F (36.7 C)  SpO2: 100%    Physical Exam Constitutional:      General: She is not in acute distress.    Appearance: Normal appearance. She is not toxic-appearing.  HENT:     Head: Normocephalic and atraumatic.  Eyes:     General: No scleral icterus. Cardiovascular:     Rate and Rhythm: Normal rate. Rhythm irregular.     Pulses: Normal pulses.     Heart sounds: Normal heart sounds.  Pulmonary:     Effort: Pulmonary effort is normal.     Breath sounds: Normal breath sounds.  Abdominal:     General: Abdomen is flat. Bowel sounds are normal.     Palpations: Abdomen is soft.  Musculoskeletal:     Cervical back: Neck supple.  Skin:    General: Skin is warm and dry.     Capillary  Refill: Capillary refill takes less than 2 seconds.     Findings: No rash.  Neurological:     General: No focal deficit present.     Mental Status: She is alert.  Psychiatric:        Mood and Affect: Mood normal.        Behavior: Behavior normal.     LABORATORY DATA:  CBC    Component Value Date/Time   WBC 6.5 02/26/2020 1246   WBC 6.4 02/12/2020 1005   RBC 3.18 (L) 02/26/2020 1247   RBC 3.22 (L) 02/26/2020 1246   HGB 9.7 (L) 02/26/2020 1246   HGB 10.5 (L) 02/23/2020 1459   HGB 8.5 (L) 04/27/2016 1352   HCT 31.2 (L) 02/26/2020 1246   HCT 31.7 (L) 02/23/2020 1459   HCT 27.3 (L) 04/27/2016 1352   PLT 118 (L) 02/26/2020 1246   PLT 137 (L) 02/23/2020 1459   MCV 96.9 02/26/2020 1246   MCV 96 02/23/2020 1459   MCV 97.5 04/27/2016 1352   MCH 30.1 02/26/2020 1246   MCHC 31.1 02/26/2020 1246   RDW 15.6 (H) 02/26/2020 1246   RDW 14.7 02/23/2020 1459   RDW 17.4 (H) 04/27/2016 1352   LYMPHSABS  0.6 (L) 02/26/2020 1246   LYMPHSABS 0.5 (L) 02/23/2019 1221   LYMPHSABS 1.0 04/27/2016 1352   MONOABS 0.5 02/26/2020 1246   MONOABS 0.9 04/27/2016 1352   EOSABS 0.2 02/26/2020 1246   EOSABS 0.2 02/23/2019 1221   BASOSABS 0.1 02/26/2020 1246   BASOSABS 0.0 02/23/2019 1221   BASOSABS 0.1 04/27/2016 1352    CMP     Component Value Date/Time   NA 139 02/26/2020 1246   NA 139 02/23/2020 1459   NA 143 04/27/2016 1352   K 4.6 02/26/2020 1246   K 4.1 04/27/2016 1352   CL 107 02/26/2020 1246   CO2 25 02/26/2020 1246   CO2 29 04/27/2016 1352   GLUCOSE 171 (H) 02/26/2020 1246   GLUCOSE 109 04/27/2016 1352   BUN 35 (H) 02/26/2020 1246   BUN 32 (H) 02/23/2020 1459   BUN 29.5 (H) 04/27/2016 1352   CREATININE 2.83 (H) 02/26/2020 1246   CREATININE 1.66 (H) 05/23/2016 1143   CREATININE 1.3 (H) 04/27/2016 1352   CALCIUM 9.1 02/26/2020 1246   CALCIUM 8.9 04/27/2016 1352   PROT 6.3 (L) 02/26/2020 1246   PROT 6.1 02/23/2020 1459   PROT 6.0 (L) 04/27/2016 1352   ALBUMIN 2.7 (L) 02/26/2020  1246   ALBUMIN 3.3 (L) 02/23/2020 1459   ALBUMIN 2.7 (L) 04/27/2016 1352   AST 43 (H) 02/26/2020 1246   AST 28 04/27/2016 1352   ALT 36 02/26/2020 1246   ALT 24 04/27/2016 1352   ALKPHOS 91 02/26/2020 1246   ALKPHOS 62 04/27/2016 1352   BILITOT 0.6 02/26/2020 1246   BILITOT 0.81 04/27/2016 1352   GFRNONAA 16 (L) 02/26/2020 1246   GFRAA 18 (L) 02/26/2020 1246       PENDING LABS:   RADIOGRAPHIC STUDIES:  US BREAST LTD UNI RIGHT INC AXILLA  Result Date: 02/24/2020 CLINICAL DATA:  74 year old female with history of right breast cancer status post lumpectomy in 2017. Patient complains of retraction of the right breast at the lumpectomy site as well as a palpable lump. Patient underwent a right breast biopsy and 2 biopsies of superficial masses in the right axillary region on February 12, 2019 which all showed fat necrosis. Patient states that she is to be scheduled for a breast MRI. EXAM: DIGITAL DIAGNOSTIC BILATERAL MAMMOGRAM WITH CAD AND TOMO ULTRASOUND RIGHT BREAST COMPARISON:  Previous exam(s). ACR Breast Density Category c: The breast tissue is heterogeneously dense, which may obscure small masses. FINDINGS: No suspicious mass or malignant type microcalcifications identified in the left breast. The patient's right breast is firm and the best images were obtained. The right breast is retracted and developing mass in the upper-outer quadrant of the breast. There are no malignant type microcalcifications. Mammographic images were processed with CAD. On physical exam, the right breast is very firm to palpation and erythematous. I palpate a discrete mass in the right breast at 10 o'clock 3 cm from the nipple. Targeted ultrasound is performed, showing a hypoechoic mass in the right breast at 10 o'clock 3 cm from the nipple measuring 2.9 x 2.0 x 2.8 cm. Sonographic evaluation the right axilla does not show any enlarged adenopathy. IMPRESSION: Indeterminate mass in the 10 o'clock region of the right  breast. This may be fat necrosis/oil cyst but malignancy cannot be excluded. RECOMMENDATION: Ultrasound-guided core biopsy in the mass in the 10 o'clock region of the right breast is recommended. The patient states that she is going to be scheduled for breast MRI. The ultrasound-guided core biopsy can after the  patient's breast MRI. I have discussed the findings and recommendations with the patient. If applicable, a reminder letter will be sent to the patient regarding the next appointment. BI-RADS CATEGORY  4: Suspicious. Electronically Signed   By: Lillia Mountain M.D.   On: 02/24/2020 16:07   MM DIAG BREAST TOMO BILATERAL  Result Date: 02/24/2020 CLINICAL DATA:  74 year old female with history of right breast cancer status post lumpectomy in 2017. Patient complains of retraction of the right breast at the lumpectomy site as well as a palpable lump. Patient underwent a right breast biopsy and 2 biopsies of superficial masses in the right axillary region on February 12, 2019 which all showed fat necrosis. Patient states that she is to be scheduled for a breast MRI. EXAM: DIGITAL DIAGNOSTIC BILATERAL MAMMOGRAM WITH CAD AND TOMO ULTRASOUND RIGHT BREAST COMPARISON:  Previous exam(s). ACR Breast Density Category c: The breast tissue is heterogeneously dense, which may obscure small masses. FINDINGS: No suspicious mass or malignant type microcalcifications identified in the left breast. The patient's right breast is firm and the best images were obtained. The right breast is retracted and developing mass in the upper-outer quadrant of the breast. There are no malignant type microcalcifications. Mammographic images were processed with CAD. On physical exam, the right breast is very firm to palpation and erythematous. I palpate a discrete mass in the right breast at 10 o'clock 3 cm from the nipple. Targeted ultrasound is performed, showing a hypoechoic mass in the right breast at 10 o'clock 3 cm from the nipple measuring  2.9 x 2.0 x 2.8 cm. Sonographic evaluation the right axilla does not show any enlarged adenopathy. IMPRESSION: Indeterminate mass in the 10 o'clock region of the right breast. This may be fat necrosis/oil cyst but malignancy cannot be excluded. RECOMMENDATION: Ultrasound-guided core biopsy in the mass in the 10 o'clock region of the right breast is recommended. The patient states that she is going to be scheduled for breast MRI. The ultrasound-guided core biopsy can after the patient's breast MRI. I have discussed the findings and recommendations with the patient. If applicable, a reminder letter will be sent to the patient regarding the next appointment. BI-RADS CATEGORY  4: Suspicious. Electronically Signed   By: Lillia Mountain M.D.   On: 02/24/2020 16:07     PATHOLOGY:     ASSESSMENT and THERAPY PLAN:   Breast cancer of upper-inner quadrant of right female breast ([place]) Right breast cancer: IDC stage IA, ER positive, PR negative, HER-2 negative, s/p Taxotere/Cytoxan x 3 cycles, adjuvant radiation and Exemestane daily since 08/2016.   Patient has had longstanding anemia where the hemoglobin was between 10 to 12 g. Since April 2020 her hemoglobin has gotten worse During the same timeframe her creatinine got from 1.5-2.44   Current treatment:Retacritinjectionsweeklystarted7/31/2020; changed to Aranesp 541m q3 weeks 11/12/2019 Bone marrow biopsy 04/08/2019: Normocellular bone marrow with trilineage hematopoiesis, absent iron stores, flow cytometry no abnormal B or T-cell clonal abnormalities noted.  IV iron therapy:Venofer: November 2020, July 2021, 01/2020 Lab review: Hemoglobin today 9.7 __________________________________________________________________________________  1. Stage IA breast cancer, ER Positive: She continues on exemestane with good tolerance and will continue this.  She recently underwent mammogram and ultrasound of her right breast that showed an enlarging mass, and was  recommended a biopsy.  I called and talked to JAlver Fisherbreast radiologist, and he reviewed her previous imaging and the fact that she can't have contrast with him, and he recommended a biopsy since the area is changing.  2. Anemia: Her hemoglobin today is 9.7.  I recommended she receive the aranesp.    3. Compression fracture: s/p kyphoplasty and biopsy.  Improving.  No sign of recurrence.  4. Weight loss: We discussed her weight loss and I made some recommendations for her diet.  This will need to be followed closely and imaging may be indicated if it persists.    We will see Hannah [name] back on 03/14/2020 for labs only and in 4 weeks for f/u with Dr. Lindi Adie.  She knows to call for any questions that may arise between now and her next appointment.  We are happy to see her sooner if needed.     Total encounter time: 30 minutes*  Wilber Bihari, NP 02/26/20 1:57 PM Medical Oncology and Hematology [facility] [place], [place] [address] Tel. [phone]    Fax. [phone]  *Total Encounter Time as defined by the Centers for Medicare and Medicaid Services includes, in addition to the face-to-face time of a patient visit (documented in the note above) non-face-to-face time: obtaining and reviewing outside history, ordering and reviewing medications, tests or procedures, care coordination (communications with other health care professionals or caregivers) and documentation in the medical record.

## 2020-02-26 NOTE — Patient Instructions (Signed)

## 2020-02-26 NOTE — Assessment & Plan Note (Addendum)
Right breast cancer: IDC stage IA, ER positive, PR negative, HER-2 negative, s/p Taxotere/Cytoxan x 3 cycles, adjuvant radiation and Exemestane daily since 08/2016.   Patient has had longstanding anemia where the hemoglobin was between 10 to 12 g. Since April 2020 her hemoglobin has gotten worse During the same timeframe her creatinine got from 1.5-2.44   Current treatment:Retacritinjectionsweeklystarted7/31/2020; changed to Aranesp 56m q3 weeks 11/12/2019 Bone marrow biopsy 04/08/2019: Normocellular bone marrow with trilineage hematopoiesis, absent iron stores, flow cytometry no abnormal B or T-cell clonal abnormalities noted.  IV iron therapy:Venofer: November 2020, July 2021, 01/2020 Lab review: Hemoglobin today 9.7 __________________________________________________________________________________  1. Stage IA breast cancer, ER Positive: She continues on exemestane with good tolerance and will continue this.  She recently underwent mammogram and ultrasound of her right breast that showed an enlarging mass, and was recommended a biopsy.  I called and talked to JAlver Fisherbreast radiologist, and he reviewed her previous imaging and the fact that she can't have contrast with him, and he recommended a biopsy since the area is changing.    2. Anemia: Her hemoglobin today is 9.7.  I recommended she receive the aranesp.    3. Compression fracture: s/p kyphoplasty and biopsy.  Improving.  No sign of recurrence.  4. Weight loss: We discussed her weight loss and I made some recommendations for her diet.  This will need to be followed closely and imaging may be indicated if it persists.    We will see SPrentissback on 03/14/2020 for labs only and in 4 weeks for f/u with Dr. GLindi Adie  She knows to call for any questions that may arise between now and her next appointment.  We are happy to see her sooner if needed.

## 2020-02-29 ENCOUNTER — Telehealth: Payer: Self-pay | Admitting: Adult Health

## 2020-02-29 NOTE — Telephone Encounter (Signed)
Scheduled appts per 9/17 los. Pt confirmed appt date and time.

## 2020-03-03 ENCOUNTER — Ambulatory Visit: Payer: Medicare Other

## 2020-03-03 ENCOUNTER — Ambulatory Visit: Payer: Medicare Other | Admitting: Hematology and Oncology

## 2020-03-03 ENCOUNTER — Other Ambulatory Visit: Payer: Medicare Other

## 2020-03-04 ENCOUNTER — Ambulatory Visit: Payer: Medicare Other | Admitting: Hematology and Oncology

## 2020-03-04 ENCOUNTER — Other Ambulatory Visit: Payer: Medicare Other

## 2020-03-04 ENCOUNTER — Ambulatory Visit: Payer: Medicare Other

## 2020-03-11 ENCOUNTER — Other Ambulatory Visit: Payer: Medicare Other

## 2020-03-14 ENCOUNTER — Inpatient Hospital Stay: Payer: Medicare Other | Attending: Adult Health

## 2020-03-14 ENCOUNTER — Other Ambulatory Visit: Payer: Self-pay

## 2020-03-14 ENCOUNTER — Ambulatory Visit (INDEPENDENT_AMBULATORY_CARE_PROVIDER_SITE_OTHER): Payer: Medicare Other

## 2020-03-14 DIAGNOSIS — Z170 Estrogen receptor positive status [ER+]: Secondary | ICD-10-CM | POA: Insufficient documentation

## 2020-03-14 DIAGNOSIS — Z79899 Other long term (current) drug therapy: Secondary | ICD-10-CM | POA: Diagnosis not present

## 2020-03-14 DIAGNOSIS — I4891 Unspecified atrial fibrillation: Secondary | ICD-10-CM

## 2020-03-14 DIAGNOSIS — Z7901 Long term (current) use of anticoagulants: Secondary | ICD-10-CM | POA: Insufficient documentation

## 2020-03-14 DIAGNOSIS — I129 Hypertensive chronic kidney disease with stage 1 through stage 4 chronic kidney disease, or unspecified chronic kidney disease: Secondary | ICD-10-CM | POA: Diagnosis not present

## 2020-03-14 DIAGNOSIS — C50211 Malignant neoplasm of upper-inner quadrant of right female breast: Secondary | ICD-10-CM | POA: Insufficient documentation

## 2020-03-14 DIAGNOSIS — G629 Polyneuropathy, unspecified: Secondary | ICD-10-CM | POA: Insufficient documentation

## 2020-03-14 DIAGNOSIS — N189 Chronic kidney disease, unspecified: Secondary | ICD-10-CM | POA: Diagnosis not present

## 2020-03-14 DIAGNOSIS — Z9221 Personal history of antineoplastic chemotherapy: Secondary | ICD-10-CM | POA: Insufficient documentation

## 2020-03-14 DIAGNOSIS — D631 Anemia in chronic kidney disease: Secondary | ICD-10-CM | POA: Insufficient documentation

## 2020-03-14 DIAGNOSIS — Z923 Personal history of irradiation: Secondary | ICD-10-CM | POA: Diagnosis not present

## 2020-03-14 DIAGNOSIS — Z79811 Long term (current) use of aromatase inhibitors: Secondary | ICD-10-CM | POA: Diagnosis not present

## 2020-03-14 LAB — CMP (CANCER CENTER ONLY)
ALT: 58 U/L — ABNORMAL HIGH (ref 0–44)
AST: 49 U/L — ABNORMAL HIGH (ref 15–41)
Albumin: 2.8 g/dL — ABNORMAL LOW (ref 3.5–5.0)
Alkaline Phosphatase: 82 U/L (ref 38–126)
Anion gap: 3 — ABNORMAL LOW (ref 5–15)
BUN: 34 mg/dL — ABNORMAL HIGH (ref 8–23)
CO2: 29 mmol/L (ref 22–32)
Calcium: 9.2 mg/dL (ref 8.9–10.3)
Chloride: 110 mmol/L (ref 98–111)
Creatinine: 2.23 mg/dL — ABNORMAL HIGH (ref 0.44–1.00)
GFR, Est AFR Am: 24 mL/min — ABNORMAL LOW (ref >60)
GFR, Estimated: 21 mL/min — ABNORMAL LOW (ref >60)
Glucose, Bld: 65 mg/dL — ABNORMAL LOW (ref 70–99)
Potassium: 4 mmol/L (ref 3.5–5.1)
Sodium: 142 mmol/L (ref 135–145)
Total Bilirubin: 0.5 mg/dL (ref 0.3–1.2)
Total Protein: 6.3 g/dL — ABNORMAL LOW (ref 6.5–8.1)

## 2020-03-14 LAB — SAMPLE TO BLOOD BANK

## 2020-03-14 LAB — CUP PACEART REMOTE DEVICE CHECK
Date Time Interrogation Session: 20210929235120
Implantable Pulse Generator Implant Date: 20190322

## 2020-03-14 LAB — RETIC PANEL
Immature Retic Fract: 6.1 % (ref 2.3–15.9)
RBC.: 3.5 MIL/uL — ABNORMAL LOW (ref 3.87–5.11)
Retic Count, Absolute: 70.4 10*3/uL (ref 19.0–186.0)
Retic Ct Pct: 2 % (ref 0.4–3.1)
Reticulocyte Hemoglobin: 33.1 pg (ref >27.9)

## 2020-03-14 LAB — CBC WITH DIFFERENTIAL (CANCER CENTER ONLY)
Abs Immature Granulocytes: 0.02 10*3/uL (ref 0.00–0.07)
Basophils Absolute: 0.1 10*3/uL (ref 0.0–0.1)
Basophils Relative: 1 %
Eosinophils Absolute: 0.3 10*3/uL (ref 0.0–0.5)
Eosinophils Relative: 5 %
HCT: 35.5 % — ABNORMAL LOW (ref 36.0–46.0)
Hemoglobin: 10.5 g/dL — ABNORMAL LOW (ref 12.0–15.0)
Immature Granulocytes: 0 %
Lymphocytes Relative: 11 %
Lymphs Abs: 0.7 10*3/uL (ref 0.7–4.0)
MCH: 30.6 pg (ref 26.0–34.0)
MCHC: 29.6 g/dL — ABNORMAL LOW (ref 30.0–36.0)
MCV: 103.5 fL — ABNORMAL HIGH (ref 80.0–100.0)
Monocytes Absolute: 0.6 10*3/uL (ref 0.1–1.0)
Monocytes Relative: 10 %
Neutro Abs: 4.6 10*3/uL (ref 1.7–7.7)
Neutrophils Relative %: 73 %
Platelet Count: 108 10*3/uL — ABNORMAL LOW (ref 150–400)
RBC: 3.43 MIL/uL — ABNORMAL LOW (ref 3.87–5.11)
RDW: 15.9 % — ABNORMAL HIGH (ref 11.5–15.5)
WBC Count: 6.3 10*3/uL (ref 4.0–10.5)
nRBC: 0 % (ref 0.0–0.2)

## 2020-03-15 LAB — IRON AND TIBC
Iron: 85 ug/dL (ref 41–142)
Saturation Ratios: 35 % (ref 21–57)
TIBC: 244 ug/dL (ref 236–444)
UIBC: 159 ug/dL (ref 120–384)

## 2020-03-15 LAB — FERRITIN: Ferritin: 159 ng/mL (ref 11–307)

## 2020-03-16 NOTE — Progress Notes (Signed)
[facility] Summary Report / Loop Recorder 

## 2020-03-18 ENCOUNTER — Other Ambulatory Visit: Payer: Self-pay | Admitting: Endocrinology

## 2020-03-24 ENCOUNTER — Ambulatory Visit: Payer: Medicare Other

## 2020-03-24 ENCOUNTER — Other Ambulatory Visit: Payer: Medicare Other

## 2020-03-24 ENCOUNTER — Ambulatory Visit: Payer: Medicare Other | Admitting: Hematology and Oncology

## 2020-03-24 IMAGING — RF DG CHOLANGIOGRAM OPERATIVE
1 series · 4 of 4 positions shown · non-contrast
Comparison: CT abdomen and pelvis - 06/10/2018

CLINICAL DATA: Intraoperative cholangiogram during laparoscopic
cholecystectomy.

EXAM:
INTRAOPERATIVE CHOLANGIOGRAM
FLUOROSCOPY TIME:  14 seconds

[Series 1: run · 4 of 95 frames shown]
[frame 15/95]
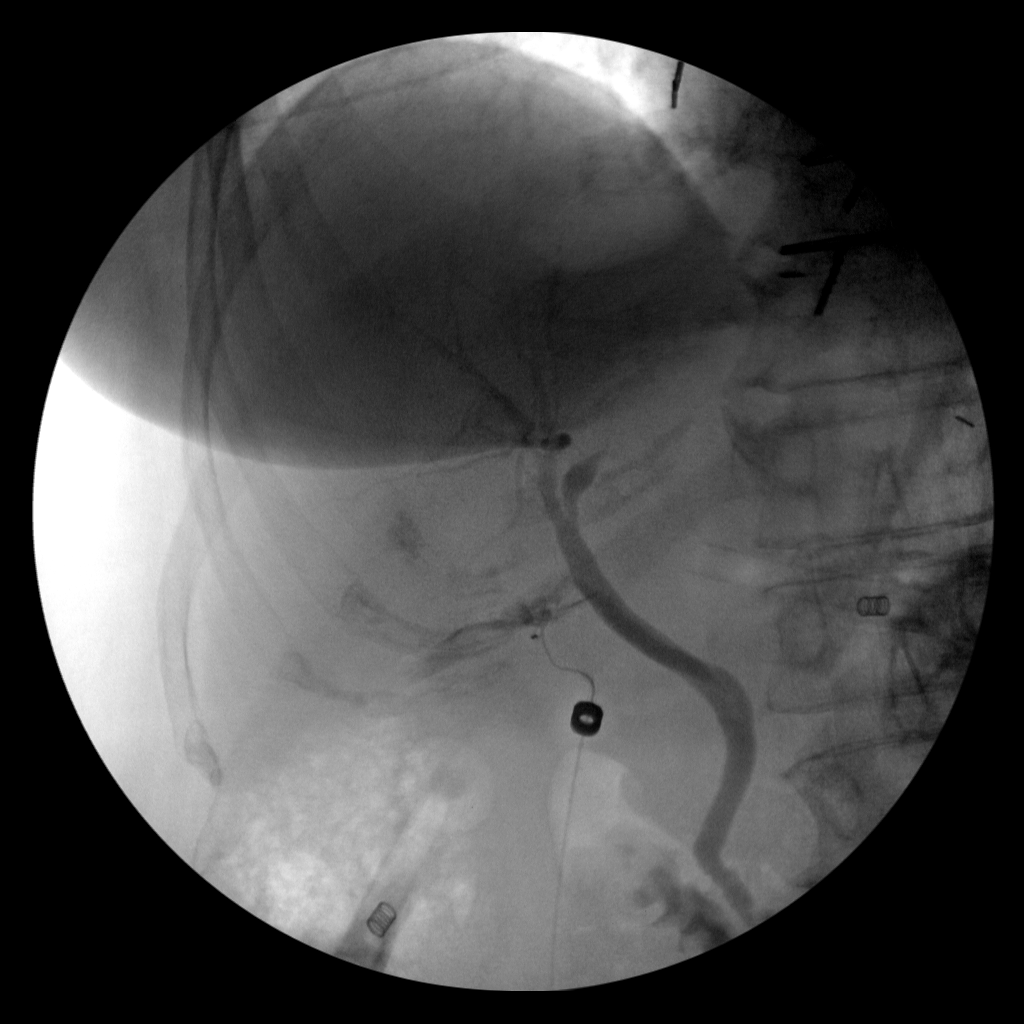
[frame 48/95]
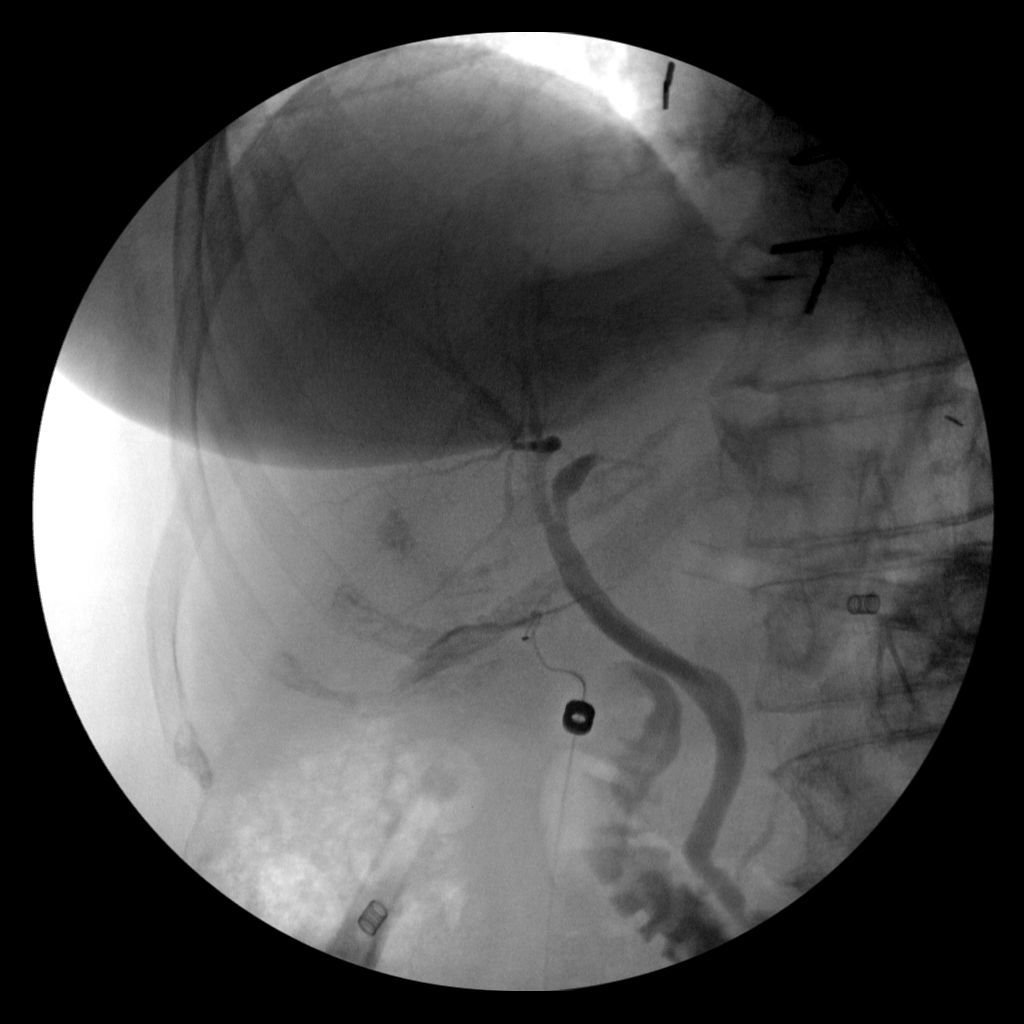
[frame 58/95]
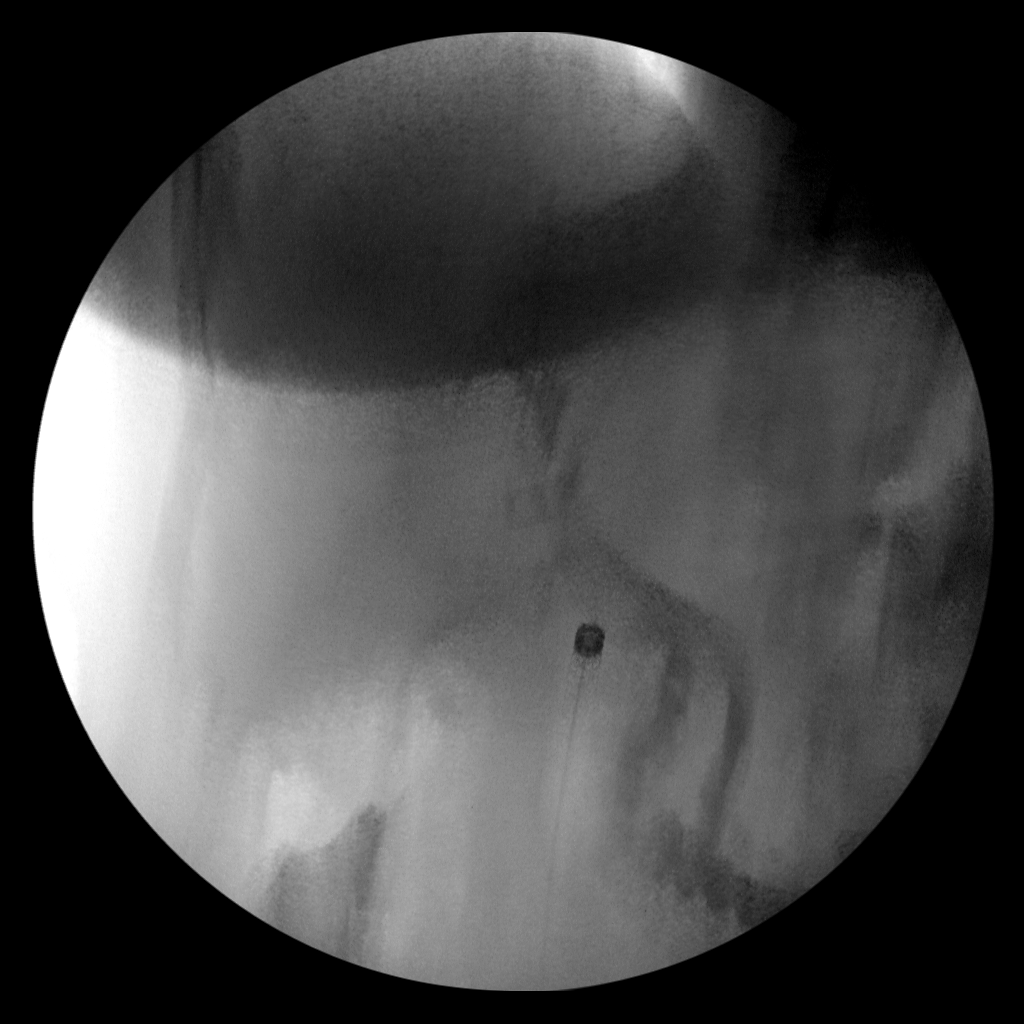
[frame 81/95]
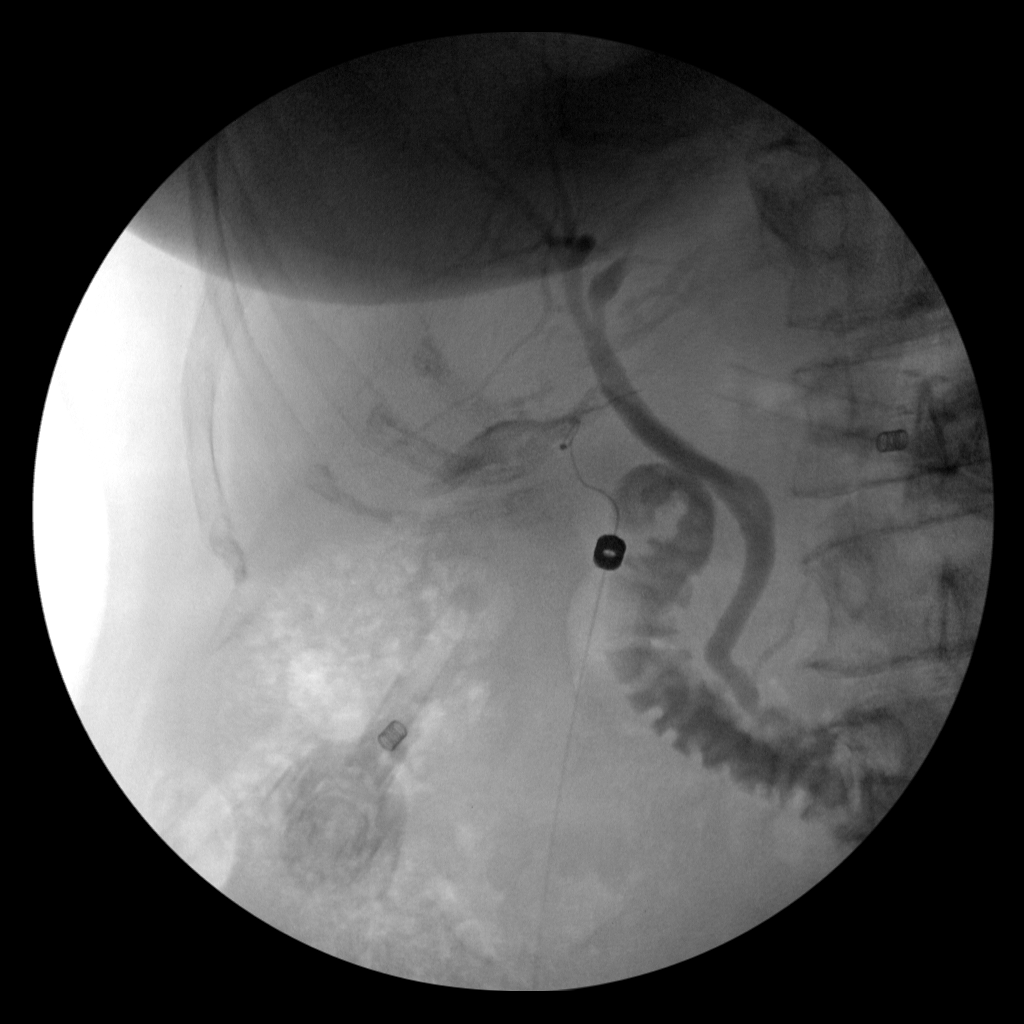

[4 of 4 positions shown; findings below may reference images not displayed]

FINDINGS: Intraoperative cholangiographic images of the right upper abdominal
quadrant during laparoscopic cholecystectomy are provided for
review.

Surgical clips overlie the expected location of the gallbladder
fossa.

Contrast injection demonstrates selective cannulation of the central
aspect of the cystic duct.

There is passage of contrast through the central aspect of the
cystic duct with filling of a non dilated common bile duct. There is
passage of contrast though the CBD and into the descending portion
of the duodenum.

There is minimal reflux of injected contrast into the common hepatic
duct and central aspect of the non dilated intrahepatic biliary
system. There is minimal opacification of the central aspect of the
pancreatic duct which appears nondilated.

There are no discrete filling defects within the opacified portions
of the biliary system to suggest the presence of
choledocholithiasis.
IMPRESSION: No evidence of choledocholithiasis.

## 2020-03-25 ENCOUNTER — Inpatient Hospital Stay (HOSPITAL_BASED_OUTPATIENT_CLINIC_OR_DEPARTMENT_OTHER): Payer: Medicare Other | Admitting: Hematology and Oncology

## 2020-03-25 ENCOUNTER — Other Ambulatory Visit: Payer: Self-pay

## 2020-03-25 ENCOUNTER — Inpatient Hospital Stay: Payer: Medicare Other

## 2020-03-25 DIAGNOSIS — C50211 Malignant neoplasm of upper-inner quadrant of right female breast: Secondary | ICD-10-CM

## 2020-03-25 DIAGNOSIS — Z170 Estrogen receptor positive status [ER+]: Secondary | ICD-10-CM | POA: Diagnosis not present

## 2020-03-25 DIAGNOSIS — D638 Anemia in other chronic diseases classified elsewhere: Secondary | ICD-10-CM

## 2020-03-25 DIAGNOSIS — I129 Hypertensive chronic kidney disease with stage 1 through stage 4 chronic kidney disease, or unspecified chronic kidney disease: Secondary | ICD-10-CM | POA: Diagnosis not present

## 2020-03-25 LAB — CMP (CANCER CENTER ONLY)
ALT: 37 U/L (ref 0–44)
AST: 38 U/L (ref 15–41)
Albumin: 2.8 g/dL — ABNORMAL LOW (ref 3.5–5.0)
Alkaline Phosphatase: 88 U/L (ref 38–126)
Anion gap: 5 (ref 5–15)
BUN: 39 mg/dL — ABNORMAL HIGH (ref 8–23)
CO2: 30 mmol/L (ref 22–32)
Calcium: 9.5 mg/dL (ref 8.9–10.3)
Chloride: 107 mmol/L (ref 98–111)
Creatinine: 2.34 mg/dL — ABNORMAL HIGH (ref 0.44–1.00)
GFR, Estimated: 20 mL/min — ABNORMAL LOW (ref >60)
Glucose, Bld: 139 mg/dL — ABNORMAL HIGH (ref 70–99)
Potassium: 4.7 mmol/L (ref 3.5–5.1)
Sodium: 142 mmol/L (ref 135–145)
Total Bilirubin: 0.6 mg/dL (ref 0.3–1.2)
Total Protein: 6.3 g/dL — ABNORMAL LOW (ref 6.5–8.1)

## 2020-03-25 LAB — CBC WITH DIFFERENTIAL (CANCER CENTER ONLY)
Abs Immature Granulocytes: 0.01 10*3/uL (ref 0.00–0.07)
Basophils Absolute: 0.1 10*3/uL (ref 0.0–0.1)
Basophils Relative: 1 %
Eosinophils Absolute: 0.3 10*3/uL (ref 0.0–0.5)
Eosinophils Relative: 4 %
HCT: 35.2 % — ABNORMAL LOW (ref 36.0–46.0)
Hemoglobin: 10.6 g/dL — ABNORMAL LOW (ref 12.0–15.0)
Immature Granulocytes: 0 %
Lymphocytes Relative: 10 %
Lymphs Abs: 0.6 10*3/uL — ABNORMAL LOW (ref 0.7–4.0)
MCH: 31.3 pg (ref 26.0–34.0)
MCHC: 30.1 g/dL (ref 30.0–36.0)
MCV: 103.8 fL — ABNORMAL HIGH (ref 80.0–100.0)
Monocytes Absolute: 0.6 10*3/uL (ref 0.1–1.0)
Monocytes Relative: 10 %
Neutro Abs: 4.3 10*3/uL (ref 1.7–7.7)
Neutrophils Relative %: 75 %
Platelet Count: 126 10*3/uL — ABNORMAL LOW (ref 150–400)
RBC: 3.39 MIL/uL — ABNORMAL LOW (ref 3.87–5.11)
RDW: 14.6 % (ref 11.5–15.5)
WBC Count: 5.8 10*3/uL (ref 4.0–10.5)
nRBC: 0 % (ref 0.0–0.2)

## 2020-03-25 LAB — SAMPLE TO BLOOD BANK

## 2020-03-25 LAB — RETIC PANEL
Immature Retic Fract: 3 % (ref 2.3–15.9)
RBC.: 3.36 MIL/uL — ABNORMAL LOW (ref 3.87–5.11)
Retic Count, Absolute: 28.6 10*3/uL (ref 19.0–186.0)
Retic Ct Pct: 0.9 % (ref 0.4–3.1)
Reticulocyte Hemoglobin: 36.4 pg (ref >27.9)

## 2020-03-25 NOTE — Assessment & Plan Note (Signed)
Right breast cancer: IDC stage IA, ER positive, PR negative, HER-2 negative, s/p Taxotere/Cytoxan x 3 cycles, adjuvant radiation and Exemestane daily since 08/2016. Exemestane toxicities: Tolerating it well. 02/24/2020: Mammogram and ultrasound: 2.9 cm right breast mass at 10 o'clock position indeterminate mass could be fat necrosis or oil cyst.  Needs biopsy

## 2020-03-25 NOTE — Assessment & Plan Note (Signed)
Patient has had longstanding anemia where the hemoglobin was between 10 to 12 g. Since April 2020 her hemoglobin has gotten worse During the same timeframe her creatinine got from 1.5-2.44   Current treatment:Retacritinjectionsweeklystarted7/31/2020; changed to Aranesp 567m q3 weeks 11/12/2019 Bone marrow biopsy 04/08/2019: Normocellular bone marrow with trilineage hematopoiesis, absent iron stores, flow cytometry no abnormal B or T-cell clonal abnormalities noted.   IV iron therapy:Venofer: November 2020, July 2021 Retacrit: Last injection 02/26/2020 Lab review: 01/22/2020: Hemoglobin 10.3 (previously transfuse for hemoglobin of 7.5) 02/04/2020: Hemoglobin 11.4 (did not receive Aranesp injection) 03/14/2020: Hemoglobin 10.5, platelets 108 (did not receive Aranesp injection)  It appears that she is requiring Aranesp injection every 6 weeks. We will see her every 6 weeks with labs and injections.

## 2020-03-25 NOTE — Progress Notes (Signed)
Patient Care Team: [name], DO as PCP - General [name], NP as PCP - Cardiology (Nurse Practitioner) Evans Lance, MD as PCP - Electrophysiology (Cardiology) Alphonsa Overall, MD as Consulting Physician (General Surgery) Nicholas Lose, MD as Consulting Physician (Hematology and Oncology) Gery Pray, MD as Consulting Physician (Radiation Oncology) Evans Lance, MD as Consulting Physician (Cardiology) Collene Gobble, MD as Consulting Physician (Pulmonary Disease) Elayne Snare, MD as Consulting Physician (Endocrinology) Delice Bison, Charlestine Massed, NP as Nurse Practitioner (Hematology and Oncology) Ardelle Balls., MD (Neurology) Izora Gala, MD as Consulting Physician (Otolaryngology) Trula Slade, DPM as Consulting Physician (Podiatry)  DIAGNOSIS:  Encounter Diagnoses  Name Primary?  . 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 Anemia of chronic disease     SUMMARY OF ONCOLOGIC HISTORY: Oncology History  Breast cancer of upper-inner quadrant of right female breast ([place])  12/14/2015 Initial Diagnosis   Right breast biopsy 12:30 position: 2 masses, 1.6 cm mass: Invasive ductal carcinoma, grade 2, ER 0%, PR 0%, HER-2 negative, Ki-67 70%; satellite mass 8 mm: IDC grade 2 ER 5%, PR 5%, HER-2 negative, Ki-67 50%; T1cN0 stage IA clinical stage   01/18/2016 Surgery   Right lumpectomy: Multifocal IDC grade 3, 1.9 cm  ER 0%, PR 0%, HER-2 negative, Ki-67 70% and 0.8 cm satellite mass ER 5%, PR 2%, HER-2 negative, Ki-67 50%, high-grade DCIS, margins negative, 0/1 lymph nodes negative T1 cN0 stage IA   02/17/2016 - 04/06/2016 Chemotherapy   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    04/08/2016 - 04/23/2016 Hospital Admission   Hosp adm for cellulitis   05/24/2016 - 07/25/2016 Radiation Therapy    Adj XRT   06/29/2016 - 07/04/2016 Hospital Admission   Seizure  like activity, MRI brain and EEG unremarkable    08/16/2016 -  Anti-estrogen oral therapy   Letrozole could not tolerate it due to dizziness and lightheadedness, switched to anastrozole 04/04/2017 switched to exemestane 05/22/2017     CHIEF COMPLIANT: Follow-up of breast cancer and severe anemia  INTERVAL HISTORY: Hannah [name] is a to see who ordered imaging she did not get any chemo or think okay what started last medical record number   ALLERGIES:  is allergic to amoxicillin, tape, aldactone [spironolactone], arimidex [anastrozole], latex, and tetracycline.  MEDICATIONS:  Current Outpatient Medications  Medication Sig Dispense Refill  . ACCU-CHEK GUIDE test strip Use to test blood sugar 3 times daily 150 strip 3  . acetaminophen (TYLENOL) 500 MG tablet Take 1,000 mg by mouth every 6 (six) hours as needed for moderate pain.    Marland Kitchen albuterol (VENTOLIN HFA) 108 (90 Base) MCG/ACT inhaler Inhale 2 puffs into the lungs every 6 (six) hours as needed for wheezing or shortness of breath. 18 g 5  . amiodarone (PACERONE) 200 MG tablet TAKE ONE TABLET BY MOUTH DAILY 90 tablet 3  . apixaban (ELIQUIS) 2.5 MG TABS tablet Take 1 tablet (2.5 mg total) by mouth 2 (two) times daily. 180 tablet 1  . Blood Glucose Monitoring Suppl (ACCU-CHEK GUIDE ME) w/Device KIT Use accu chek meter to check blood sugar three times daily. 1 kit 0  . budesonide-formoterol (SYMBICORT) 160-4.5 MCG/ACT inhaler Inhale 2 puffs into the lungs 2 (two) times daily. 1 Inhaler 5  . Cholecalciferol (DIALYVITE VITAMIN D 5000) 125 MCG (5000 UT) capsule Take 5,000 Units by mouth daily.    . Dulaglutide (TRULICITY) 1.5  MG/0.5ML SOPN Inject 1.5 mg into the skin once a week. 6 mL 1  . exemestane (AROMASIN) 25 MG tablet TAKE ONE TABLET BY MOUTH DAILY AFTER BREAKFAST 90 tablet 3  . ezetimibe (ZETIA) 10 MG tablet TAKE ONE TABLET BY MOUTH EVERY DAY 90 tablet 3  . famotidine (PEPCID) 20 MG tablet Take 20 mg by mouth 2 (two) times daily.    .  furosemide (LASIX) 20 MG tablet Take 20 mg by mouth daily as needed for fluid.     Marland Kitchen insulin glargine (LANTUS) 100 UNIT/ML injection Inject 20 Units into the skin daily.     . Insulin Lispro (HUMALOG KWIKPEN [place]) Inject 6-8 Units into the skin 3 (three) times daily.     . Insulin Pen Needle (BD PEN NEEDLE NANO U/F) 32G X 4 MM MISC USE AS INSTRUCTED TO INJECT INSULIN 4 TIMES DAILY. 360 each 1  . loratadine (CLARITIN) 10 MG tablet Take 10 mg by mouth daily.     . metoprolol tartrate (LOPRESSOR) 25 MG tablet Take 1 tablet (25 mg total) by mouth 2 (two) times daily. Extra dose if needed for palpitations. 180 tablet 3  . midodrine (PROAMATINE) 2.5 MG tablet Take 2.5 mg by mouth 2 (two) times daily.     . nitroGLYCERIN (NITROSTAT) 0.4 MG SL tablet Place 1 tablet (0.4 mg total) under the tongue every 5 (five) minutes as needed for chest pain. 30 tablet 3  . ondansetron (ZOFRAN ODT) 4 MG disintegrating tablet Take 1 tablet (4 mg total) by mouth every 8 (eight) hours as needed. 10 tablet 0  . rosuvastatin (CRESTOR) 40 MG tablet Take 1 tablet (40 mg total) by mouth at bedtime. 90 tablet 3  . traMADol (ULTRAM) 50 MG tablet Take 1 tablet (50 mg total) by mouth every 6 (six) hours as needed. 30 tablet 0  . zolpidem (AMBIEN) 5 MG tablet Take 5 mg by mouth at bedtime.      No current facility-administered medications for this visit.    PHYSICAL EXAMINATION: ECOG PERFORMANCE STATUS: 1 - Symptomatic but completely ambulatory  Vitals:   03/25/20 1109  BP: (!) 105/43  Pulse: 66  Resp: 17  Temp: 98.2 F (36.8 C)  SpO2: 100%   Filed Weights   03/25/20 1109  Weight: 137 lb 4.8 oz (62.3 kg)       LABORATORY DATA:  I have reviewed the data as listed CMP Latest Ref Rng & Units 03/14/2020 02/26/2020 02/23/2020  Glucose 70 - 99 mg/dL 65(L) 171(H) 171(H)  BUN 8 - 23 mg/dL 34(H) 35(H) 32(H)  Creatinine 0.44 - 1.00 mg/dL 2.23(H) 2.83(H) 2.62(H)  Sodium 135 - 145 mmol/L 142 139 139  Potassium 3.5 - 5.1 mmol/L  4.0 4.6 6.2(HH)  Chloride 98 - 111 mmol/L 110 107 107(H)  CO2 22 - 32 mmol/L _0 Calcium 8.9 - 10.3 mg/dL 9.2 9.1 9.1  Total Protein 6.5 - 8.1 g/dL 6.3(L) 6.3(L) 6.1  Total Bilirubin 0.3 - 1.2 mg/dL 0.5 0.6 0.4  Alkaline Phos 38 - 126 U/L 82 91 112  AST 15 - 41 U/L 49(H) 43(H) 35  ALT 0 - 44 U/L 58(H) 36 31    Lab Results  Component Value Date   WBC 5.8 03/25/2020   HGB 10.6 (L) 03/25/2020   HCT 35.2 (L) 03/25/2020   MCV 103.8 (H) 03/25/2020   PLT 126 (L) 03/25/2020   NEUTROABS 4.3 03/25/2020    ASSESSMENT & PLAN:  Breast cancer of upper-inner quadrant of right  female breast ([place]) Right breast cancer: IDC stage IA, ER positive, PR negative, HER-2 negative, s/p Taxotere/Cytoxan x 3 cycles, adjuvant radiation and Exemestane daily since 08/2016. Exemestane toxicities: Tolerating it well.  02/24/2020: Mammogram and ultrasound: 2.9 cm right breast mass at 10 o'clock position indeterminate mass could be fat necrosis or oil cyst.  We discussed the pros and cons of doing a biopsy.  We finally decided that we will recheck her mammogram and ultrasound in 6 months.  Anemia of chronic disease Patient has had longstanding anemia where the hemoglobin was between 10 to 12 g. Since April 2020 her hemoglobin has gotten worse During the same timeframe her creatinine got from 1.5-2.44   Current treatment:Retacritinjectionsweeklystarted7/31/2020; changed to Aranesp 514m q3 weeks 11/12/2019 Bone marrow biopsy 04/08/2019: Normocellular bone marrow with trilineage hematopoiesis, absent iron stores, flow cytometry no abnormal B or T-cell clonal abnormalities noted.   IV iron therapy:Venofer: November 2020, July 2021 Retacrit: Last injection 02/26/2020 Lab review: 01/22/2020: Hemoglobin 10.3 (previously transfuse for hemoglobin of 7.5) 02/04/2020: Hemoglobin 11.4 (did not receive Aranesp injection) 03/14/2020: Hemoglobin 10.5, platelets 108 (did not receive Aranesp injection) 03/25/20:  Hemoglobin 10.6 (did not receive Aranesp)   We will see her in 2 months with labs and injection    No orders of the defined types were placed in this encounter.  The patient has a good understanding of the overall plan. she agrees with it. she will call with any problems that may develop before the next visit here. Total time spent: 30 mins including face to face time and time spent for planning, charting and co-ordination of care   VHarriette Ohara MD 03/25/20

## 2020-03-28 NOTE — Progress Notes (Addendum)
CARDIOLOGY OFFICE NOTE  Date:  04/04/2020    Hannah [name] Date of Birth: 08/25/1945 Medical Record #[id number]  PCP:  [name], DO  Cardiologist:  Atilano Median    Chief Complaint  Patient presents with  . Follow-up    History of Present Illness: Hannah [name] is a 74 y.o. female who presents today for a 6 week follow up visit. Seen for Dr. Lovena Le.Primarily follows with me.  She has a very complex/extensivehistory - this includes ahistory ofCAD s/p CABG (1998 with redo 2009- Dr. Cyndia Bent), bioprosthetic AVR in 4098, chronic diastolic CHF, asthma, carotid artery disease (1-39% by duplex 10/2017, due 2021), CKD stage IV(Dr. Posey Pronto), PAF- has been on Multaq in the past and now on amiodarone, atrial tachycardia, breast CA, orthostasis,chronicanemia, thrombocytopenia, mild dilation of ascending aorta by echo 10/2018, recurrent syncope/orthostasis,priorrecurrentrightpleural effusion requiring thoracentesis4/2020and 12/2020with subsequent Pleurex/talc by Dr. Cyndia Bent, HTN, GERD,&HLD.   I have followed her for many years - she has had periodic issues with volume overload, orthostasis and tachy palpitations.Progressive CKD. Last cath in 2014 - grafts patent. Myoview in 2015 without ischemia.  She has had worsening anemia. She has ended up having multiple repeat breast biopsies that were negative following abnormal mammogram (after her breast cancer). She has had worsening orthostasis - to the point of injury - fell in our parking lot in 02/2019 with significant laceration to the head - she is back on Midodrine. Was not able to have colonoscopy due to those events. EF remains normal. She has had recurrent kidney stones that has resulted in worsening CKD. She requires periodic transfusions for her anemia.   When seen towards the end of June - trying to do more in the way of activity. Had been switched from Retacrit to Aranesp by hematology. Feeling better with  higher dose of Imdur from earlier this summer. She has not been vaccinated - half of her family has not - we had a long discussion about this.  When seen in August - had required more transfusions. Was using less Lasix. BP lower and although her chest pain seemed better with Imdur, she thought she was not tolerating so this was stopped after her visit. Unfortunately - she suffered a fall - has had to have kyphoplasty. Her kidney function continues to deteriorate. Discussed going back to Gi for more thorough evaluation given her ongoing anemia and difficulty maintaining her HGB. Last seen in mid September - counts seemed to be dropping again - overall felt to be holding her own - but still pretty tenuous situation.   Comes in today. Here with Hannah [name]. She says she is doing ok - but "today is a good day". Having what sounds like angina along with fast heart beating at times. This makes her quite anxious. She is worried about taking sl NTG and the effects. She did not tolerate Imdur in the past due to dizziness and hypotension. Her counts are staying up - her swelling is gone - her weight is stable. BP running a bit higher. She is taking Midodrine 3 times a day. Not using any extra Metoprolol. Not going to have breast MRI - needs contrast and does not wish to proceed. So - will have follow up mammogram in March.   Past Medical History:  Diagnosis Date  . Abnormally small mouth   . Allergic rhinitis 10/14/2009   Qualifier: Diagnosis of  By: Lamonte Sakai MD, Rose Fillers   Overview:  Overview:  Qualifier: Diagnosis of  By:  Byrum MD, Rose Fillers  Last Assessment & Plan:  Please continue Xyzal and Nasacort as you have been using them  . Anemia   . Asthma 05/12/2009   10/12/2014 p extensive coaching HFA effectiveness =    75% s spacer    Overview:  Overview:  10/12/2014 p extensive coaching HFA effectiveness =    75% s spacer   Last Assessment & Plan:  Please continue Symbicort 2 puffs twice a day. Remember to rinse and gargle  after taking this medication. Take albuterol 2 puffs up to every 4 hours if needed for shortness of breath.  Follow with Dr Lamonte Sakai in 6 month  . Bilateral carotid artery stenosis    a. mild 1-39% by duplex 10/2017, due 2021.  . Breast cancer ([place]) 01/18/2016   right breast  . CAD (coronary artery disease) 01/24/2011   a. s/p CABG 1998 with redo 2009.  Marland Kitchen Chemotherapy induced nausea and vomiting 02/24/2016  . Chemotherapy-induced peripheral neuropathy ([place]) 04/27/2016  . 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 Chronic diastolic CHF (congestive heart failure) ([place]) 09/24/2013  . CKD (chronic kidney disease), stage IV ([place]) 09/24/2013  . Closed fracture of head of left humerus 09/09/2017  . Complication of anesthesia   . Dysrhythmia    a-fib  . Gallstones   . GERD (gastroesophageal reflux disease)   . Glaucoma   . H/O atrial tachycardia 05/12/2009   Qualifier: History of  By: Lamonte Sakai MD, Rose Fillers   Overview:  Overview:  Qualifier: History of  By: Lamonte Sakai MD, Rose Fillers  Last Assessment & Plan:  There was no evidence of this on her cardiac monitor. I would recommend watchful waiting.   Marland Kitchen Heart murmur   . History of kidney stones   . History of non-ST elevation myocardial infarction (NSTEMI)    Sept 2014--  thought to be type II HTN w/ LHC without infarct related artery and patent grafts  . History of radiation therapy 05/24/16-07/26/16   right breast 50.4 Gy in 28 fractions, right breast boost 10 Gy in 5 fractions  . Hyperlipidemia   . Hypotension   . Iron deficiency anemia   . Mania ([place]) 03/05/2016  . Moderate persistent asthma    pulmologist-  Dr. Malvin Johns  . Osteomyelitis of toe of left foot ([place]) 04/06/2016  . Osteopenia of multiple sites 10/19/2015  . PAF (paroxysmal atrial fibrillation) ([place])   . Personal history of chemotherapy 2017  . Personal history of radiation therapy 2017  . PONV (postoperative nausea and vomiting)   . Port catheter in place 02/17/2016  . Psoriasis    right  leg  . Renal calculus, right   . S/P AVR    prosthesis valve placement 2009 at same time re-do CABG  . Seizure-like activity ([place]) 09/01/2017  . Sensorineural hearing loss (SNHL) of both ears 01/06/2016  . Stroke [facility]) 2014   residual rt hearing loss  . Syncope 09/06/2017  . Type 2 diabetes mellitus ([place])    monitored by dr Dwyane Dee    Past Surgical History:  Procedure Laterality Date  . AORTIC VALVE REPLACEMENT  2009   [facility] (done same time is CABG)  . BREAST BIOPSY Right 02/12/2019   2 lymphnodes, 1 breast  . BREAST LUMPECTOMY Right 01/18/2016  . BREAST LUMPECTOMY WITH RADIOACTIVE SEED AND SENTINEL LYMPH NODE BIOPSY Right 01/18/2016   Procedure: RIGHT BREAST LUMPECTOMY WITH RADIOACTIVE SEED AND SENTINEL LYMPH NODE BIOPSY;  Surgeon: DAlphonsa Overall MD;  [place]  Service: General;  Laterality: Right;  . CARDIAC CATHETERIZATION  03/23/2008   Pre-redo CABG: L main OK, LAD (T), CFX (T), OM1 99%, RCA (T), LIMA-LAD OK, SVG-OM(?3) OK w/ little florw to OM2, SVG-RCA OK. EF NL  . CARPAL TUNNEL RELEASE    . CHEST TUBE INSERTION Right 06/26/2019   Procedure: INSERTION PLEURAL DRAINAGE CATHETER;  Surgeon: Gaye Pollack, MD;  Location: [place];  Service: Thoracic;  Laterality: Right;  . CHOLECYSTECTOMY N/A 07/07/2018   Procedure: LAPAROSCOPIC CHOLECYSTECTOMY WITH INTRAOPERATIVE CHOLANGIOGRAM ERAS PATHWAY;  Surgeon: Alphonsa Overall, MD;  Location: [facility];  Service: General;  Laterality: N/A;  . COLONOSCOPY     around 2015. Possibly with [facility]   . CORONARY ARTERY BYPASS GRAFT  1998 &  re-do 2009   Had LIMA to DX/LAD, SVG to 2 marginal branches and SVG to [facility] originally; SVG to 3rd OM and PD at time of redo  . CYSTOSCOPY W/ URETERAL STENT PLACEMENT Right 12/20/2014   Procedure: CYSTOSCOPY WITH RETROGRADE PYELOGRAM/URETERAL STENT PLACEMENT;  Surgeon: Cleon Gustin, MD;  Location: [facility];  Service: Urology;  Laterality: Right;  . CYSTOSCOPY  WITH RETROGRADE PYELOGRAM, URETEROSCOPY AND STENT PLACEMENT Left 08/21/2019   Procedure: CYSTOSCOPY WITH RETROGRADE PYELOGRAM, URETEROSCOPY AND STENT PLACEMENT;  Surgeon: Cleon Gustin, MD;  [facility];  Service: Urology;  Laterality: Left;  1 HR  . ESOPHAGOGASTRODUODENOSCOPY     many years ago per patient   . ESOPHAGOGASTRODUODENOSCOPY ENDOSCOPY  06/17/2018  . EYE SURGERY Bilateral    cataracts  . HOLMIUM LASER APPLICATION Right 11/29/3084   Procedure:  HOLMIUM LASER LITHOTRIPSY;  Surgeon: Cleon Gustin, MD;  Location: [facility];  Service: Urology;  Laterality: Right;  . HOLMIUM LASER APPLICATION Left 5/78/4696   Procedure: HOLMIUM LASER APPLICATION;  Surgeon: Cleon Gustin, MD;  [facility];  Service: Urology;  Laterality: Left;  . IR KYPHO LUMBAR INC FX REDUCE BONE BX UNI/BIL CANNULATION INC/IMAGING  02/12/2020  . IR THORACENTESIS ASP PLEURAL SPACE W/IMG GUIDE  10/03/2018  . IR THORACENTESIS ASP PLEURAL SPACE W/IMG GUIDE  06/08/2019  . LEFT HEART CATHETERIZATION WITH CORONARY/GRAFT ANGIOGRAM N/A 02/23/2013   Procedure: LEFT HEART CATHETERIZATION WITH Beatrix Fetters;  Surgeon: Blane Ohara, MD;  Location[facility] CATH LAB;  Service: Cardiovascular;  Laterality: N/A;  . LOOP RECORDER INSERTION N/A 08/30/2017   Procedure: LOOP RECORDER INSERTION;  Surgeon: Evans Lance, MD;  Location: [place] CV LAB;  Service: Cardiovascular;  Laterality: N/A;  . PORTACATH PLACEMENT Left 01/18/2016   Procedure: INSERTION PORT-A-CATH;  Surgeon: Alphonsa Overall, MD;  Location: [place];  Service: General;  Laterality: Left;  . portacath removal    . REMOVAL OF PLEURAL DRAINAGE CATHETER Right 07/14/2019   Procedure: REMOVAL OF PLEURAL DRAINAGE CATHETER;  Surgeon: Gaye Pollack, MD;  Location: [place];  Service: Thoracic;  Laterality: Right;  . TALC PLEURODESIS N/A 06/26/2019   Procedure: Pietro Cassis;  Surgeon: Gaye Pollack, MD;  Location[facility];  Service:  Thoracic;  Laterality: N/A;  . TONSILLECTOMY    . TRANSTHORACIC ECHOCARDIOGRAM  02-24-2013      mild LVH,  ef 50-55%/  AV bioprosthesis was present with very mild stenosis and no regurg., mean grandient 12mHg, peak grandient 296mg /  mild MR/  mild LAE and RAE/  moderate TR  . TUBAL LIGATION       Medications: Current Meds  Medication Sig  . ACCU-CHEK GUIDE test strip Use to test blood sugar  3 times daily  . acetaminophen (TYLENOL) 500 MG tablet Take 1,000 mg by mouth every 6 (six) hours as needed for moderate pain.  Marland Kitchen albuterol (VENTOLIN HFA) 108 (90 Base) MCG/ACT inhaler Inhale 2 puffs into the lungs every 6 (six) hours as needed for wheezing or shortness of breath.  Marland Kitchen amiodarone (PACERONE) 200 MG tablet TAKE ONE TABLET BY MOUTH DAILY  . apixaban (ELIQUIS) 2.5 MG TABS tablet Take 1 tablet (2.5 mg total) by mouth 2 (two) times daily.  . Blood Glucose Monitoring Suppl (ACCU-CHEK GUIDE ME) w/Device KIT Use accu chek meter to check blood sugar three times daily.  . budesonide-formoterol (SYMBICORT) 160-4.5 MCG/ACT inhaler Inhale 2 puffs into the lungs 2 (two) times daily.  . Cholecalciferol (DIALYVITE VITAMIN D 5000) 125 MCG (5000 UT) capsule Take 5,000 Units by mouth daily.  . Dulaglutide (TRULICITY) 1.5 ZO/1.0RU SOPN Inject 1.5 mg into the skin once a week.  Marland Kitchen exemestane (AROMASIN) 25 MG tablet TAKE ONE TABLET BY MOUTH DAILY AFTER BREAKFAST  . ezetimibe (ZETIA) 10 MG tablet TAKE ONE TABLET BY MOUTH EVERY DAY  . famotidine (PEPCID) 20 MG tablet Take 20 mg by mouth 2 (two) times daily.  . furosemide (LASIX) 20 MG tablet Take 20 mg by mouth daily as needed for fluid.   Marland Kitchen insulin glargine (LANTUS) 100 UNIT/ML injection Inject 20 Units into the skin daily.   . Insulin Lispro (HUMALOG KWIKPEN [place]) Inject 6-8 Units into the skin 3 (three) times daily.   . Insulin Pen Needle (BD PEN NEEDLE NANO U/F) 32G X 4 MM MISC USE AS INSTRUCTED TO INJECT INSULIN 4 TIMES DAILY.  Marland Kitchen loratadine (CLARITIN) 10 MG  tablet Take 10 mg by mouth daily.   . metoprolol tartrate (LOPRESSOR) 25 MG tablet Take 1 tablet (25 mg total) by mouth 2 (two) times daily. Extra dose if needed for palpitations.  . midodrine (PROAMATINE) 2.5 MG tablet Take 2.5 mg by mouth 2 (two) times daily.   . nitroGLYCERIN (NITROSTAT) 0.4 MG SL tablet Place 1 tablet (0.4 mg total) under the tongue every 5 (five) minutes as needed for chest pain.  Marland Kitchen ondansetron (ZOFRAN ODT) 4 MG disintegrating tablet Take 1 tablet (4 mg total) by mouth every 8 (eight) hours as needed.  . rosuvastatin (CRESTOR) 40 MG tablet Take 1 tablet (40 mg total) by mouth at bedtime.  . traMADol (ULTRAM) 50 MG tablet Take 1 tablet (50 mg total) by mouth every 6 (six) hours as needed.  . zolpidem (AMBIEN) 5 MG tablet Take 5 mg by mouth at bedtime.   . [DISCONTINUED] nitroGLYCERIN (NITROSTAT) 0.4 MG SL tablet Place 1 tablet (0.4 mg total) under the tongue every 5 (five) minutes as needed for chest pain.     Allergies: Allergies  Allergen Reactions  . Amoxicillin Rash and Other (See Comments)    Tolerates Cephalosporins Has patient had a PCN reaction causing immediate rash, facial/tongue/throat swelling, SOB or lightheadedness with hypotension: Yes Has patient had a PCN reaction causing severe rash involving mucus membranes or skin necrosis: Yes Has patient had a PCN reaction that required hospitalization No Has patient had a PCN reaction occurring within the last 10 years: No If all of the above answers are "NO", then may proceed with Cephalosporin use.   . Tape Other (See Comments)    Pulls off skin, must use paper tape  . Aldactone [Spironolactone] Other (See Comments)    CKD/hypokalemia  . Arimidex [Anastrozole] Nausea Only  . Latex Itching and Other (See Comments)    (  Dentist office)  . Tetracycline Rash    Social History: The patient  reports that she has never smoked. She has never used smokeless tobacco. She reports that she does not drink alcohol and  does not use drugs.   Family History: The patient's family history includes Diabetes in her maternal grandmother and son; Healthy in her brother; Heart attack in her brother; Heart disease in her brother, father, and maternal grandmother; Heart failure in her father.   Review of Systems: Please see the history of present illness.   All other systems are reviewed and negative.   Physical Exam: VS:  BP (!) 146/58 (BP Location: Left Arm, Patient Position: Sitting, Cuff Size: Normal)   Pulse 72   Ht 5' 4"  (1.626 m)   Wt 135 lb 12.8 oz (61.6 kg)   LMP  (LMP Unknown)   SpO2 99% Comment: at rest  BMI 23.31 kg/m  .  BMI Body mass index is 23.31 kg/m.  Wt Readings from Last 3 Encounters:  04/04/20 135 lb 12.8 oz (61.6 kg)  03/25/20 137 lb 4.8 oz (62.3 kg)  02/26/20 131 lb 3.2 oz (59.5 kg)    General: Pleasant. Alert and in no acute distress.   Cardiac: Regular rate and rhythm. Her valve sounds good. She has no edema.  Respiratory:  Lungs are clear to auscultation bilaterally with normal work of breathing.  GI: Soft and nontender.  MS: No deformity or atrophy. Gait and ROM intact. She is using a walker.  Skin: Warm and dry. Color is normal.  Neuro:  Strength and sensation are intact and no gross focal deficits noted.  Psych: Alert, appropriate and with normal affect.   LABORATORY DATA:  EKG:  EKG is not ordered today.    Lab Results  Component Value Date   WBC 5.8 03/25/2020   HGB 10.6 (L) 03/25/2020   HCT 35.2 (L) 03/25/2020   PLT 126 (L) 03/25/2020   GLUCOSE 139 (H) 03/25/2020   CHOL 106 02/23/2020   TRIG 74 02/23/2020   HDL 33 (L) 02/23/2020   LDLDIRECT 53.0 10/08/2016   LDLCALC 58 02/23/2020   ALT 37 03/25/2020   AST 38 03/25/2020   NA 142 03/25/2020   K 4.7 03/25/2020   CL 107 03/25/2020   CREATININE 2.34 (H) 03/25/2020   BUN 39 (H) 03/25/2020   CO2 30 03/25/2020   TSH 1.860 02/23/2020   INR 1.2 02/12/2020   HGBA1C 6.1 (H) 08/13/2019   MICROALBUR 14.2 (H)  07/02/2019     BNP (last 3 results) No results for input(s): BNP in the last 8760 hours.  ProBNP (last 3 results) No results for input(s): PROBNP in the last 8760 hours.   Other Studies Reviewed Today:  ECHO IMPRESSIONS 05/2019  1. Left ventricular ejection fraction, by visual estimation, is 60 to 65%. The left ventricle has normal function. Left ventricular septal wall thickness was normal. Normal left ventricular posterior wall thickness. There is no left ventricular  hypertrophy.  2. Elevated left ventricular end-diastolic pressure.  3. Left ventricular diastolic parameters are consistent with Grade II diastolic dysfunction (pseudonormalization).  4. Global right ventricle has normal systolic function.The right ventricular size is normal. No increase in right ventricular wall thickness.  5. Left atrial size was severely dilated.  6. Right atrial size was normal.  7. Mild mitral annular calcification.  8. Mild thickening of the anterior and posterior mitral valve leaflet(s).  9. The mitral valve is normal in structure. Mild mitral valve regurgitation. No evidence  of mitral stenosis.  10. The tricuspid valve is normal in structure.  11. Aortic valve regurgitation is not visualized. No evidence of aortic valve sclerosis or stenosis.  12. The pulmonic valve was normal in structure. Pulmonic valve regurgitation is trivial.  13. Normal pulmonary artery systolic pressure.  14. The inferior vena cava is normal in size with greater than 50% respiratory variability, suggesting right atrial pressure of 3 mmHg.    CT CHEST IMPRESSION 04/2019:  Moderate right pleural effusion, increased from prior CT. Associated  right lower lobe atelectasis.  No evidence of pneumonia.  1.2 x 2.4 cm lesion along the inferior aspect of the right  breast/chest wall. While additional findings noted above correlate  with areas of fat necrosis, this lesion was not clearly identified  when correlating with  prior breast tomography report. Breast imaging  consultation is suggested for further evaluation, including  potential ultrasound with biopsy versus breast MR. These results  will be called to the ordering clinician or representative by the  Radiologist Assistant, and communication documented in the PACS or  zVision Dashboard.  Aortic Atherosclerosis (ICD10-I70.0).  Electronically Signed  By: Julian Hy M.D.  On: 05/06/2019 18:11    CT ABDOMEN IMPRESSION 03/2019: Chest Impression:  1. Mild RIGHT basilar atelectasis and small RIGHT effusion.  2. Increased skin thickening in the lateral RIGHT breast. Recommend  clinical correlation.  3. Stable seroma in the RIGHT breast.   Abdomen / Pelvis Impression:  1. No acute findings in the abdomen pelvis.  2. Nonobstructing calculi in the LEFT renal pelvis.  3. Degenerative endplate change in the lumbar spine similar to  comparison CT 2019.  4. Coronary artery calcification and Aortic Atherosclerosis  (ICD10-I70.0).  Electronically Signed  By: Suzy Bouchard M.D.  On: 03/17/2019 09:59   ABD [place] 02/17/2019  Other findings: There is a somewhat lobulated fluid collection  identified along the inferior aspect of the liver. This measures 2.6  x 3.4 x 4.3 cm. This was not seen on the prior CT examination and  may be related to a focal fluid collection from prior  cholecystectomy. Mild perihepatic ascites is noted.  Note is made of right-sided pleural effusion.  IMPRESSION:  Status post cholecystectomy. There is a lobulated cystic area as  described above along the inferior right hepatic margin. This could  be related to the prior surgery possibly representing a small  seroma. CT may be helpful for further evaluation.  Small right pleural effusion.  Mild ascites.  Increased echogenicity within the liver likely related to fatty  infiltration or underlying hepatocellular disease.  Electronically Signed  By: Inez Catalina M.D.  On:  02/17/2019 10:18    Breast Ultrasound 01/2019  IMPRESSION:  1. New irregular hypoechoic mass along the posterior margin of the  scar right breast 8 o'clock position. Findings are nonspecific  however concerning for the possibility of localized recurrence.  2. Within the outer aspect of the right axilla there are two  adjacent irregular hypoechoic masses just deep to the skin which are  nonspecific. These may represent focal fat necrosis or potentially  recurrent or metastatic disease.  RECOMMENDATION:  1. Ultrasound-guided core needle biopsy right breast mass 8 o'clock  position just deep to the scar.  2. Ultrasound-guided core needle biopsy of both irregular hypoechoic  masses within the outer aspect of the right axilla.    MYOVIEW FINDINGS FROM 06/2013:  Normal resting EKG. Slight ST depressions noted in aVL after  administration of lexiscan. Mild shortness of breath  and nausea  resolved after the test. EKG is nondiagnostic for ischemia. TID  ratio 1.05. Lung-heart ratio 0.43. Normal ventricular chamber size.  IMPRESSION:  No evidence for ischemia. Normal wall motion. LVEF 72%.  Electronically Signed  By: Guy Sandifer    Cardiac Cath: 02/23/2013  Left mainstem: Normal  Left anterior descending (LAD): Severe proximal and mid calcification with long proximal 95% stenosis. The mid and distal vessel is small but free of high grade disease.  Left circumflex (LCx): AV groove has a mid 90% stenosis before a moderate sized MOM. OM1 and OM2 are occluded at the ostium and fill via the SVG. OM3 occluded at the ostium and fills via SVG. The grafted OMs are small and diffusely disease.  Right coronary artery (RCA): Occluded in the mid vessel. The PDA is moderate sized and occluded at the ostium. There is a 70% stenosis in the PDA after the insertion of the vein graft.  Grafts:  LIMA to LAD: Patent  SVG to RCA: Occluded (from original CABG)  SVG to RCA: Patent (from redo CABG). This is  mild diffuse plaque within the vein graft.  SVG to OM3: Patent. This is mild diffuse plaque within the vein graft.  SVG sequential to OM1/OM2: Patent. There is ostial 50% stenosis. There is a patent proximal SVG stent. The native marginals are very small.  Left ventriculography: LV not injected. AVR not crossed.  Final Conclusions: Severe native vessel CAD. Patent grafts as described with nonobstructive disease in the grafts. A stent placed into the sequential SVG is patent with some disease at the ostium that on several views in not occlusive. I did compare the 2009 cath with results today. There is high grade disease in the circumflex AV groove. However, this is not changed from 2009. However, this lesion does appear to lead into a non grafted OM. If she has further symptoms I would consider PCI of the native circumflex.     Assessment/Plan:  1. CAD - prior CABG and then redo CABG in 2009 - last cath in 2014 - sounds like she is having more angina - this is worrisome - she does not wish to have cath due to possible worsening of her kidney function. She did not tolerate Imdur. BP is higher now - will cut the Midodrine back and hopefully increase her Lopressor if this continues.   Addendum: - discussed her case with Dr. Burt Knack - agrees she would be high risk for nephrotoxicity with cath - but if needed - would be staged with IVF and diagnostic only and close follow up. Will continue with her current medical management.   2. Chronic diastolic HF - weight is stable. No evidence of volume overload.   3. CKD  4. Anemia - counts have improved. Never did have her colonoscopy when due last year due to her fall and subsequent concussion.   5. Chronic orthostatic hypotension - cutting Midodrine back - BP is creeping up.   6. Carotid disease - recent study stable.   7. PAF - on low dose amiodarone.   8. Breast cancer  9. Chronic anticoagulation  10. Health maintenance - she is asking if ok  to get flu shot - I said yes - she remains very hesitant to take COVID vaccine.    Current medicines are reviewed with the patient today.  The patient does not have concerns regarding medicines other than what has been noted above.  The following changes have been made:  See above.  Labs/  tests ordered today include:   No orders of the defined types were placed in this encounter.    Disposition:   FU with me in about 2 months.    Patient is agreeable to this plan and will call if any problems develop in the interim.   SignedTruitt Merle, NP  04/04/2020 4:24 PM  [facility] [address] [place], [place]  [phone] Phone: [phone] Fax: [phone]

## 2020-04-01 ENCOUNTER — Other Ambulatory Visit: Payer: Medicare Other

## 2020-04-04 ENCOUNTER — Other Ambulatory Visit: Payer: Self-pay

## 2020-04-04 ENCOUNTER — Ambulatory Visit (INDEPENDENT_AMBULATORY_CARE_PROVIDER_SITE_OTHER): Payer: Medicare Other | Admitting: Nurse Practitioner

## 2020-04-04 ENCOUNTER — Other Ambulatory Visit (INDEPENDENT_AMBULATORY_CARE_PROVIDER_SITE_OTHER): Payer: Medicare Other

## 2020-04-04 ENCOUNTER — Encounter: Payer: Self-pay | Admitting: Nurse Practitioner

## 2020-04-04 VITALS — BP 146/58 | HR 72 | Ht 64.0 in | Wt 135.8 lb

## 2020-04-04 DIAGNOSIS — I480 Paroxysmal atrial fibrillation: Secondary | ICD-10-CM | POA: Diagnosis not present

## 2020-04-04 DIAGNOSIS — I2510 Atherosclerotic heart disease of native coronary artery without angina pectoris: Secondary | ICD-10-CM

## 2020-04-04 DIAGNOSIS — Z794 Long term (current) use of insulin: Secondary | ICD-10-CM

## 2020-04-04 DIAGNOSIS — E1165 Type 2 diabetes mellitus with hyperglycemia: Secondary | ICD-10-CM | POA: Diagnosis not present

## 2020-04-04 DIAGNOSIS — I5032 Chronic diastolic (congestive) heart failure: Secondary | ICD-10-CM | POA: Diagnosis not present

## 2020-04-04 LAB — COMPREHENSIVE METABOLIC PANEL
ALT: 58 U/L — ABNORMAL HIGH (ref 0–35)
AST: 69 U/L — ABNORMAL HIGH (ref 0–37)
Albumin: 3.3 g/dL — ABNORMAL LOW (ref 3.5–5.2)
Alkaline Phosphatase: 76 U/L (ref 39–117)
BUN: 38 mg/dL — ABNORMAL HIGH (ref 6–23)
CO2: 28 mEq/L (ref 19–32)
Calcium: 8.9 mg/dL (ref 8.4–10.5)
Chloride: 105 mEq/L (ref 96–112)
Creatinine, Ser: 2.85 mg/dL — ABNORMAL HIGH (ref 0.40–1.20)
GFR: 15.79 mL/min — ABNORMAL LOW (ref >60.00)
Glucose, Bld: 210 mg/dL — ABNORMAL HIGH (ref 70–99)
Potassium: 4.7 mEq/L (ref 3.5–5.1)
Sodium: 139 mEq/L (ref 135–145)
Total Bilirubin: 0.5 mg/dL (ref 0.2–1.2)
Total Protein: 6.3 g/dL (ref 6.0–8.3)

## 2020-04-04 LAB — HEMOGLOBIN A1C: Hgb A1c MFr Bld: 7.1 % — ABNORMAL HIGH (ref 4.6–6.5)

## 2020-04-04 LAB — TSH: TSH: 2.47 u[IU]/mL (ref 0.35–4.50)

## 2020-04-04 MED ORDER — NITROGLYCERIN 0.4 MG SL SUBL: 0.4000 mg | SUBLINGUAL_TABLET | SUBLINGUAL | 3 refills | Status: AC | PRN

## 2020-04-04 NOTE — Patient Instructions (Signed)
After Visit Summary:  We will be checking the following labs today - NONE   Medication Instructions:    Continue with your current medicines. BUT  I am cutting Midodrine back to twice a day - let us know in about a week - if your still having issues - we will then increase the Metoprolol to three times a day   If you need a refill on your cardiac medications before your next appointment, please call your pharmacy.     Testing/Procedures To Be Arranged:  N/A  Follow-Up:   See me in 6 to 8 weeks.     At [facility], you and your health needs are our priority.  As part of our continuing mission to provide you with exceptional heart care, we have created designated Provider Care Teams.  These Care Teams include your primary Cardiologist (physician) and Advanced Practice Providers (APPs -  Physician Assistants and Nurse Practitioners) who all work together to provide you with the care you need, when you need it.  Special Instructions:  . Stay safe, wash your hands for at least 20 seconds and wear a mask when needed.  . It was good to talk with you today.    Call the [place] office at [phone] if you have any questions, problems or concerns.

## 2020-04-08 ENCOUNTER — Other Ambulatory Visit: Payer: Self-pay

## 2020-04-08 ENCOUNTER — Ambulatory Visit (INDEPENDENT_AMBULATORY_CARE_PROVIDER_SITE_OTHER): Payer: Medicare Other | Admitting: Endocrinology

## 2020-04-08 ENCOUNTER — Encounter: Payer: Self-pay | Admitting: Endocrinology

## 2020-04-08 VITALS — BP 128/64 | HR 64 | Ht 64.0 in | Wt 136.8 lb

## 2020-04-08 DIAGNOSIS — Z23 Encounter for immunization: Secondary | ICD-10-CM

## 2020-04-08 DIAGNOSIS — E1165 Type 2 diabetes mellitus with hyperglycemia: Secondary | ICD-10-CM

## 2020-04-08 DIAGNOSIS — Z794 Long term (current) use of insulin: Secondary | ICD-10-CM

## 2020-04-08 DIAGNOSIS — G629 Polyneuropathy, unspecified: Secondary | ICD-10-CM | POA: Diagnosis not present

## 2020-04-08 MED ORDER — L-METHYLFOLATE-B6-B12 3-35-2 MG PO TABS: 1.0000 | ORAL_TABLET | Freq: Two times a day (BID) | ORAL | 2 refills | Status: DC

## 2020-04-08 NOTE — Progress Notes (Signed)
Patient ID: Hannah [name], female   DOB: 1946-03-31, 74 y.o.   MRN: [id number]           Reason for Appointment: Follow-up for Type 2 Diabetes   History of Present Illness:          Date of diagnosis of type 2 diabetes mellitus: Late 1980s        Background history:  She was previously treated with metformin prior to starting insulin She thinks she has been on insulin for at least 10 years and mostly taking Lantus and other types of insulin with it Her metformin was stopped when she started insulin and also has had renal dysfunction Previously her A1c had been consistently over 8% and occasionally higher.  She had been on the V-go pump since 03/2015, this was stopped in 3/20  Recent history:   INSULIN regimen is described as:  Lantus 20 units in am, Humalog 6-8 units before meals  Non-insulin hypoglycemic drugs the patient is taking are: Trulicity 1.5 mg weekly   Current diabetes management, blood sugar patterns and problems identified:   Her A1c is usually lower than expected, now back up to 7.1 compared to last 6.1  Fructosamine 241 previously    She did not bring her monitor for download today  She says that she is trying to calculate her Humalog based on carbohydrate coverage and she is using the calorie [employer] app  However she still thinks her blood sugars are higher in the afternoon especially after lunch even though she may not always have a meal at lunchtime  Not clear if her blood sugars are consistently high after dinner  However she thinks her morning sugars are more near normal  She has lost weight from various intercurrent health problems and pain  She is still trying to take Trulicity which had helped her level of control  She is not able to do much walking because of balance issues and recent pain  Dinner 6-7 PM usually  Side effects from medications have been: None  Compliance with the medical regimen: Fair  Glucose monitoring:  done about 2-3  times a day         Glucometer:  Accu-Chek [employer]iva  Blood Glucose readings by recall:   PRE-MEAL Fasting Lunch Dinner Bedtime Overall  Glucose range: 97-123  200  140-180?   Mean/median:        Previously:   PRE-MEAL Fasting Lunch  6-9 PM Bedtime Overall  Glucose range:  121-234  114-199  117-262 129-296   Mean/median:  148   193  172 170     Self-care: The diet that the patient has been following is: tries to limit fats and carbs.  She uses diet green tea or diet drinks and no sugar in drinks    Lunch 1-2 pm Dinner 6-7 PM  Typical meal intake: Breakfast is oatrmeal or cheese toast              Dietician visit, most recent: 5/16 And in 7/16 with oncology dietitian    Weight history:  Wt Readings from Last 3 Encounters:  04/08/20 136 lb 12.8 oz (62.1 kg)  04/04/20 135 lb 12.8 oz (61.6 kg)  03/25/20 137 lb 4.8 oz (62.3 kg)    Glycemic control:      Lab Results  Component Value Date   HGBA1C 7.1 (H) 04/04/2020   HGBA1C 6.1 (H) 08/13/2019   HGBA1C 7.1 (H) 07/02/2019   Lab Results  Component Value Date  MICROALBUR 14.2 (H) 07/02/2019   LDLCALC 58 02/23/2020   CREATININE 2.85 (H) 04/04/2020   Lab Results  Component Value Date   FRUCTOSAMINE 241 12/11/2019   FRUCTOSAMINE 276 07/02/2019   FRUCTOSAMINE 276 10/11/2017        Allergies as of 04/08/2020      Reactions   Amoxicillin Rash, Other (See Comments)   Tolerates Cephalosporins Has patient had a PCN reaction causing immediate rash, facial/tongue/throat swelling, SOB or lightheadedness with hypotension: Yes Has patient had a PCN reaction causing severe rash involving mucus membranes or skin necrosis: Yes Has patient had a PCN reaction that required hospitalization No Has patient had a PCN reaction occurring within the last 10 years: No If all of the above answers are "NO", then may proceed with Cephalosporin use.   Tape Other (See Comments)   Pulls off skin, must use paper tape   Aldactone  [spironolactone] Other (See Comments)   CKD/hypokalemia   Arimidex [anastrozole] Nausea Only   Latex Itching, Other (See Comments)   (Dentist office)   Tetracycline Rash      Medication List       Accurate as of April 08, 2020  4:21 PM. If you have any questions, ask your nurse or doctor.        Accu-Chek Guide Me w/Device Kit Use accu chek meter to check blood sugar three times daily.   Accu-Chek Guide test strip Generic drug: glucose blood Use to test blood sugar 3 times daily   acetaminophen 500 MG tablet Commonly known as: TYLENOL Take 1,000 mg by mouth every 6 (six) hours as needed for moderate pain.   albuterol 108 (90 Base) MCG/ACT inhaler Commonly known as: VENTOLIN HFA Inhale 2 puffs into the lungs every 6 (six) hours as needed for wheezing or shortness of breath.   amiodarone 200 MG tablet Commonly known as: PACERONE TAKE ONE TABLET BY MOUTH DAILY   apixaban 2.5 MG Tabs tablet Commonly known as: ELIQUIS Take 1 tablet (2.5 mg total) by mouth 2 (two) times daily.   BD Pen Needle Nano U/F 32G X 4 MM Misc Generic drug: Insulin Pen Needle USE AS INSTRUCTED TO INJECT INSULIN 4 TIMES DAILY.   budesonide-formoterol 160-4.5 MCG/ACT inhaler Commonly known as: SYMBICORT Inhale 2 puffs into the lungs 2 (two) times daily.   Dialyvite Vitamin D 5000 125 MCG (5000 UT) capsule Generic drug: Cholecalciferol Take 5,000 Units by mouth daily.   exemestane 25 MG tablet Commonly known as: AROMASIN TAKE ONE TABLET BY MOUTH DAILY AFTER BREAKFAST   ezetimibe 10 MG tablet Commonly known as: ZETIA TAKE ONE TABLET BY MOUTH EVERY DAY   famotidine 20 MG tablet Commonly known as: PEPCID Take 20 mg by mouth 2 (two) times daily.   furosemide 20 MG tablet Commonly known as: LASIX Take 20 mg by mouth daily as needed for fluid.   HUMALOG KWIKPEN [place] Inject 6-8 Units into the skin 3 (three) times daily.   insulin glargine 100 UNIT/ML injection Commonly known as: LANTUS  Inject 20 Units into the skin daily.   l-methylfolate-B6-B12 3-35-2 MG Tabs tablet Commonly known as: METANX Take 1 tablet by mouth in the morning and at bedtime. Started by: Elayne Snare, MD   loratadine 10 MG tablet Commonly known as: CLARITIN Take 10 mg by mouth daily.   metoprolol tartrate 25 MG tablet Commonly known as: LOPRESSOR Take 1 tablet (25 mg total) by mouth 2 (two) times daily. Extra dose if needed for palpitations.   midodrine 2.5  MG tablet Commonly known as: PROAMATINE Take 2.5 mg by mouth 2 (two) times daily.   nitroGLYCERIN 0.4 MG SL tablet Commonly known as: NITROSTAT Place 1 tablet (0.4 mg total) under the tongue every 5 (five) minutes as needed for chest pain.   ondansetron 4 MG disintegrating tablet Commonly known as: Zofran ODT Take 1 tablet (4 mg total) by mouth every 8 (eight) hours as needed.   rosuvastatin 40 MG tablet Commonly known as: CRESTOR Take 1 tablet (40 mg total) by mouth at bedtime.   traMADol 50 MG tablet Commonly known as: ULTRAM Take 1 tablet (50 mg total) by mouth every 6 (six) hours as needed.   Trulicity 1.5 KL/4.9ZP Sopn Generic drug: Dulaglutide Inject 1.5 mg into the skin once a week.   zolpidem 5 MG tablet Commonly known as: AMBIEN Take 5 mg by mouth at bedtime.       Allergies:  Allergies  Allergen Reactions  . Amoxicillin Rash and Other (See Comments)    Tolerates Cephalosporins Has patient had a PCN reaction causing immediate rash, facial/tongue/throat swelling, SOB or lightheadedness with hypotension: Yes Has patient had a PCN reaction causing severe rash involving mucus membranes or skin necrosis: Yes Has patient had a PCN reaction that required hospitalization No Has patient had a PCN reaction occurring within the last 10 years: No If all of the above answers are "NO", then may proceed with Cephalosporin use.   . Tape Other (See Comments)    Pulls off skin, must use paper tape  . Aldactone [Spironolactone]  Other (See Comments)    CKD/hypokalemia  . Arimidex [Anastrozole] Nausea Only  . Latex Itching and Other (See Comments)    (Dentist office)  . Tetracycline Rash    Past Medical History:  Diagnosis Date  . Abnormally small mouth   . Allergic rhinitis 10/14/2009   Qualifier: Diagnosis of  By: Lamonte Sakai MD, Rose Fillers   Overview:  Overview:  Qualifier: Diagnosis of  By: Lamonte Sakai MD, Rose Fillers  Last Assessment & Plan:  Please continue Xyzal and Nasacort as you have been using them  . Anemia   . Asthma 05/12/2009   10/12/2014 p extensive coaching HFA effectiveness =    75% s spacer    Overview:  Overview:  10/12/2014 p extensive coaching HFA effectiveness =    75% s spacer   Last Assessment & Plan:  Please continue Symbicort 2 puffs twice a day. Remember to rinse and gargle after taking this medication. Take albuterol 2 puffs up to every 4 hours if needed for shortness of breath.  Follow with Dr Lamonte Sakai in 6 month  . Bilateral carotid artery stenosis    a. mild 1-39% by duplex 10/2017, due 2021.  . Breast cancer ([place]) 01/18/2016   right breast  . CAD (coronary artery disease) 01/24/2011   a. s/p CABG 1998 with redo 2009.  Marland Kitchen Chemotherapy induced nausea and vomiting 02/24/2016  . 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 Chronic diastolic CHF (congestive heart failure) ([place]) 09/24/2013  . CKD (chronic kidney disease), stage IV ([place]) 09/24/2013  . Closed fracture of head of left humerus 09/09/2017  . Complication of anesthesia   . Dysrhythmia    a-fib  . Gallstones   . GERD (gastroesophageal reflux disease)   . Glaucoma   . H/O atrial tachycardia 05/12/2009   Qualifier: History of  By: Lamonte Sakai MD, Rose Fillers   Overview:  Overview:  Qualifier: History of  By: Lamonte Sakai MD,  Rose Fillers  Last Assessment & Plan:  There was no evidence of this on her cardiac monitor. I would recommend watchful waiting.   Marland Kitchen Heart murmur   . History of kidney stones   .  History of non-ST elevation myocardial infarction (NSTEMI)    Sept 2014--  thought to be type II HTN w/ LHC without infarct related artery and patent grafts  . History of radiation therapy 05/24/16-07/26/16   right breast 50.4 Gy in 28 fractions, right breast boost 10 Gy in 5 fractions  . Hyperlipidemia   . Hypotension   . Iron deficiency anemia   . Mania ([place]) 03/05/2016  . Moderate persistent asthma    pulmologist-  Dr. Malvin Johns  . Osteomyelitis of toe of left foot ([place]) 04/06/2016  . Osteopenia of multiple sites 10/19/2015  . PAF (paroxysmal atrial fibrillation) ([place])   . Personal history of chemotherapy 2017  . Personal history of radiation therapy 2017  . PONV (postoperative nausea and vomiting)   . Port catheter in place 02/17/2016  . Psoriasis    right leg  . Renal calculus, right   . S/P AVR    prosthesis valve placement 2009 at same time re-do CABG  . Seizure-like activity ([place]) 09/01/2017  . Sensorineural hearing loss (SNHL) of both ears 01/06/2016  . Stroke [facility]) 2014   residual rt hearing loss  . Syncope 09/06/2017  . Type 2 diabetes mellitus ([place])    monitored by dr Dwyane Dee    Past Surgical History:  Procedure Laterality Date  . AORTIC VALVE REPLACEMENT  2009   [facility] same [facility] is [facility]  . BREAST BIOPSY Right 02/12/2019   2 lymphnodes, 1 breast  . BREAST LUMPECTOMY Right 01/18/2016  . BREAST LUMPECTOMY WITH RADIOACTIVE SEED AND SENTINEL LYMPH NODE BIOPSY Right 01/18/2016   Procedure: RIGHT BREAST LUMPECTOMY WITH RADIOACTIVE SEED AND SENTINEL LYMPH NODE BIOPSY;  Surgeon: DAlphonsa Overall MD;  [place]  Service: General;  Laterality: Right;  . CARDIAC CATHETERIZATION  03/23/2008   Pre-redo CABG: L main OK, LAD (T), CFX (T), OM1 99%, RCA (T), LIMA-LAD OK, SVG-OM(?3) OK w/ little florw to OM2, SVG-RCA OK. EF NL  . CARPAL TUNNEL RELEASE    . CHEST TUBE INSERTION Right 06/26/2019   Procedure: INSERTION PLEURAL DRAINAGE CATHETER;   Surgeon: BGaye Pollack MD;  [place]  Service: Thoracic;  Laterality: Right;  . CHOLECYSTECTOMY N/A 07/07/2018   Procedure: LAPAROSCOPIC CHOLECYSTECTOMY WITH INTRAOPERATIVE CHOLANGIOGRAM ERAS PATHWAY;  Surgeon: NAlphonsa Overall MD;  Location: [facility];  Service: General;  Laterality: N/A;  . COLONOSCOPY     around 2015. Possibly with [place]   . CORONARY ARTERY BYPASS GRAFT  1998 &  re-do 2009   Had LIMA to DX/LAD, SV[facility] 2 marginal [facility] SVG to 3rd OM and PD at time of redo  . CYSTOSCOPY W/ URETERAL STENT PLACEMENT Right 12/20/2014   Procedure: CYSTOSCOPY WITH RETROGRADE PYELOGRAM/URETERAL STENT PLACEMENT;  Surgeon: PCleon Gustin MD;  [facility]  Service: Urology;  Laterality: Right;  . CYSTOSCOPY WITH RETROGRADE PYELOGRAM, URETEROSCOPY AND STENT PLACEMENT Left 08/21/2019   Procedure: CYSTOSCOPY WITH RETROGRADE PYELOGRAM, URETEROSCOPY AND STENT PLACEMENT;  Surgeon: MCleon Gustin MD;  Location[facility];  Service: Urology;  Laterality: Left;  1 HR  . ESOPHAGOGASTRODUODENOSCOPY     many years ago per patient   . ESOPHAGOGASTRODUODENOSCOPY ENDOSCOPY  06/17/2018  . EYE SURGERY Bilateral  cataracts  . HOLMIUM LASER APPLICATION Right 1/60/7371   Procedure:  HOLMIUM LASER LITHOTRIPSY;  Surgeon: Cleon Gustin, MD;  Location: [facility];  Service: Urology;  Laterality: Right;  . HOLMIUM LASER APPLICATION Left 0/62/6948   Procedure: HOLMIUM LASER APPLICATION;  Surgeon: Cleon Gustin, MD;  Location: [facility];  Service: Urology;  Laterality: Left;  . IR KYPHO LUMBAR INC FX REDUCE BONE BX UNI/BIL CANNULATION INC/IMAGING  02/12/2020  . IR THORACENTESIS ASP PLEURAL SPACE W/IMG GUIDE  10/03/2018  . IR THORACENTESIS ASP PLEURAL SPACE W/IMG GUIDE  06/08/2019  . LEFT HEART CATHETERIZATION WITH CORONARY/GRAFT ANGIOGRAM N/A 02/23/2013   Procedure: LEFT HEART CATHETERIZATION WITH Beatrix Fetters;  Surgeon: Blane Ohara, MD;  [facility] CATH LAB;  Service: Cardiovascular;  Laterality: N/A;  . LOOP RECORDER INSERTION N/A 08/30/2017   Procedure: LOOP RECORDER INSERTION;  Surgeon: Evans Lance, MD;  Location: [place] CV LAB;  Service: Cardiovascular;  Laterality: N/A;  . PORTACATH PLACEMENT Left 01/18/2016   Procedure: INSERTION PORT-A-CATH;  Surgeon: Alphonsa Overall, MD;  Location: [place];  Service: General;  Laterality: Left;  . portacath removal    . REMOVAL OF PLEURAL DRAINAGE CATHETER Right 07/14/2019   Procedure: REMOVAL OF PLEURAL DRAINAGE CATHETER;  Surgeon: Gaye Pollack, MD;  Location: [place];  Service: Thoracic;  Laterality: Right;  . TALC PLEURODESIS N/A 06/26/2019   Procedure: Pietro Cassis;  Surgeon: Gaye Pollack, MD;  Location: [facility];  Service: Thoracic;  Laterality: N/A;  . TONSILLECTOMY    . TRANSTHORACIC ECHOCARDIOGRAM  02-24-2013      mild LVH,  ef 50-55%/  AV bioprosthesis was present with very mild stenosis and no regurg., mean grandient 79mHg, peak grandient 267mg /  mild MR/  mild LAE and RAE/  moderate TR  . TUBAL LIGATION      Family History  Problem Relation Age of Onset  . Heart disease Father   . Heart failure Father   . Diabetes Maternal Grandmother   . Heart disease Maternal Grandmother   . Diabetes Son   . Healthy Brother        #1  . Heart attack Brother        #2  . Heart disease Brother        #2  . Colon cancer Neg Hx   . Esophageal cancer Neg Hx     Social History:  reports that she has never smoked. She has never used smokeless tobacco. She reports that she does not drink alcohol and does not use drugs.    Review of Systems    Lipid history: On 40 mg Crestor along with Zetia      Lab Results  Component Value Date   CHOL 106 02/23/2020   HDL 33 (L) 02/23/2020   LDLCALC 58 02/23/2020   LDLDIRECT 53.0 10/08/2016   TRIG 74 02/23/2020   CHOLHDL 3.2 02/23/2020           Eyes:  No history of retinopathy  She is followed by nephrologist  for renal dysfunction which shows variable results   Lab Results  Component Value Date   CREATININE 2.85 (H) 04/04/2020   CREATININE 2.34 (H) 03/25/2020   CREATININE 2.23 (H) 03/14/2020    Diabetic foot exam in 3/20: She has significant loss of monofilament sensation in her distal feet She thinks her neuropathy started after her chemotherapy She has difficulty with balance and has had falls She has significant numbness but no pain or  paresthesia She was offered Lyrica by oncologist but she was afraid of side effects  BLOOD pressure: She has variable blood pressure readings as below  BP Readings from Last 3 Encounters:  04/08/20 128/64  04/04/20 (!) 146/58  03/25/20 (!) 105/43   She is on amiodarone 200 mg, thyroid levels have been normal  Lab Results  Component Value Date   TSH 2.47 04/04/2020      Physical Examination:  BP 128/64   Pulse 64   Ht _0  (1.626 m)   Wt 136 lb 12.8 oz (62.1 kg)   LMP  (LMP Unknown)   SpO2 99%   BMI 23.48 kg/m     ASSESSMENT:  Diabetes type 2, on insulin    See history of present illness for detailed discussion of  current management, blood sugar patterns and problems identified  Blood sugar prior readings, readings and averages reviewed from her meter and compared to previous data  A1c is 7.1 compared to 6.1  Currently on basal bolus insulin injections and Trulicity  Weight is down significantly from intercurrent illnesses  Although she is requiring about the same dose of basal insulin and fasting blood sugars are better controlled she is reports high readings after meals despite her weight loss However difficult to be sure how exactly her blood sugars are reading because of lack of monitor for download  NEUROPATHY with sensory loss: This is from chemotherapy but possibly also from diabetes  Chronic amiodarone therapy: TSH is normal  PLAN:    She will increase her Humalog by taking 1 unit for every 7 g of carbohydrate  at lunch and 1: 8 at dinnertime, may need to adjust this ratio further She can still take 3 to 4 units midday if she is eating a snack since she has high readings in the afternoon anyway No change in Lantus as yet She will call back to let us know what exactly her blood sugars are Chair exercises recommended Continue 1.5 mg Trulicity, would not increase the dose because of her weight loss  She can try Metanx for her neuropathy since she does not have any pain  Patient Instructions  Divide carbs by 7 at lunch for Humalog  Divide Carbs by 8 at supper          Elayne Snare 04/08/2020, 4:21 PM   Note: This office note was prepared with Dragon voice recognition system technology. Any transcriptional errors that result from this process are unintentional.

## 2020-04-08 NOTE — Patient Instructions (Signed)
Divide carbs by 7 at lunch for Humalog  Divide Carbs by 8 at supper

## 2020-04-11 ENCOUNTER — Other Ambulatory Visit: Payer: Self-pay | Admitting: Nurse Practitioner

## 2020-04-11 MED ORDER — METOPROLOL TARTRATE 25 MG PO TABS
25.0000 mg | ORAL_TABLET | Freq: Three times a day (TID) | ORAL | 3 refills | Status: DC
Start: 2020-04-11 — End: 2020-05-02

## 2020-04-15 ENCOUNTER — Telehealth: Payer: Self-pay | Admitting: Endocrinology

## 2020-04-15 LAB — CUP PACEART REMOTE DEVICE CHECK
Date Time Interrogation Session: 20211101235501
Implantable Pulse Generator Implant Date: 20190322

## 2020-04-15 NOTE — Telephone Encounter (Signed)
Patient requests to be called re: Patient's PHARM/Insurance Insurance told patient that they sent Dr. Dwyane Dee PA form for RX for l-methylfolate-B6-B12, however, have not received the PA for the medication listed.

## 2020-04-15 NOTE — Telephone Encounter (Signed)
We can try doing a PA for peripheral neuropathy.  Unlikely that it will be approved

## 2020-04-15 NOTE — Telephone Encounter (Signed)
Is this an OTC medication?  You told me not to do the PA for it. Please advise.

## 2020-04-16 IMAGING — CR DG SACRUM/COCCYX 2+V
3 series · 3 of 3 positions shown · non-contrast
Comparison: CT abdomen and pelvis June 10, 2018

CLINICAL DATA: Fell 1 year ago landing on tailbone, persistent
pain.

EXAM:
SACRUM AND COCCYX - 2+ VIEW

[t sacrum a.p.]
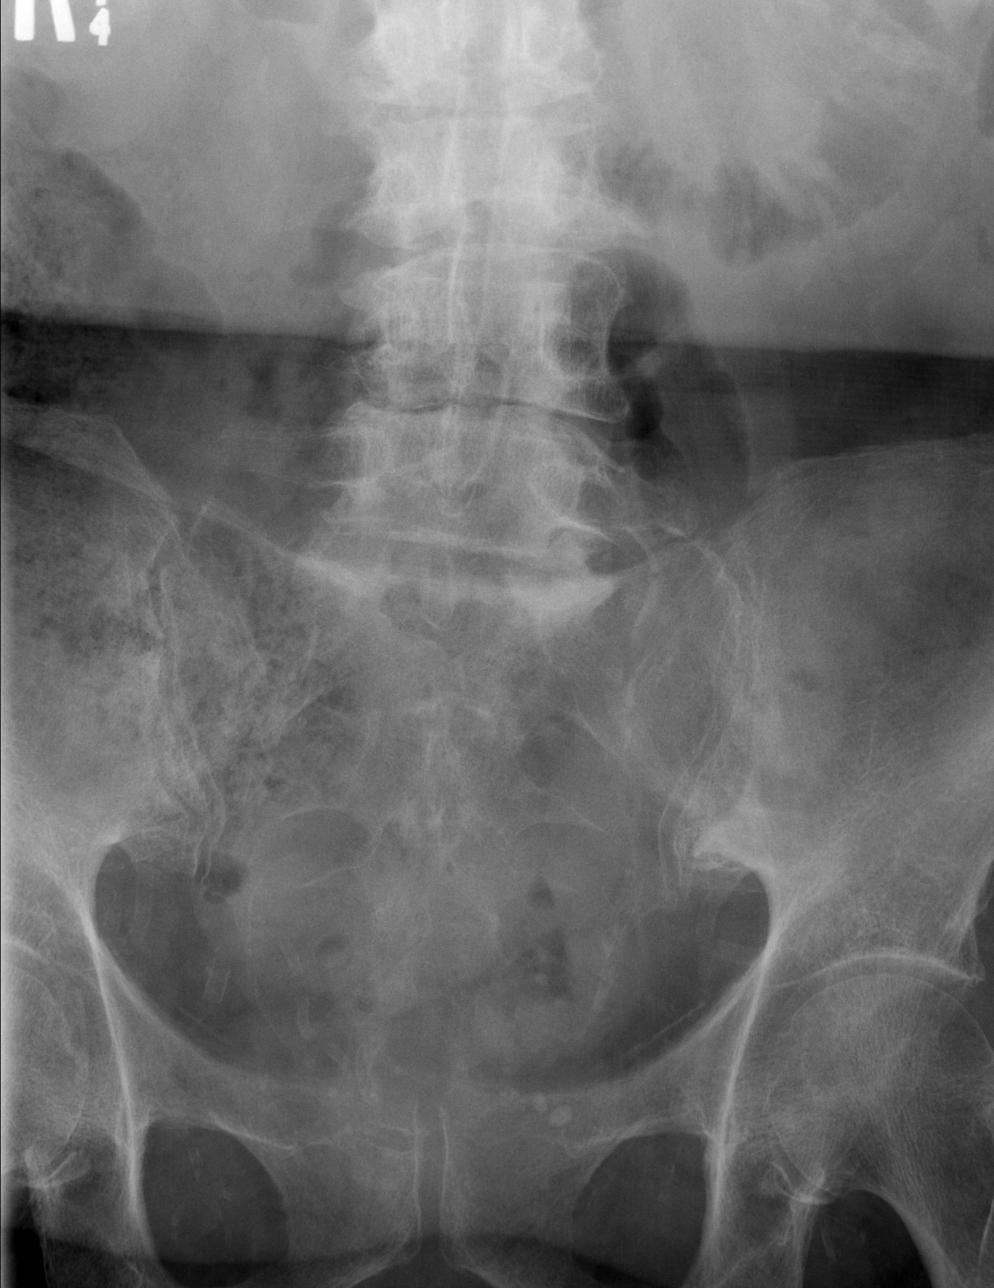

[t coccyx a.p.]
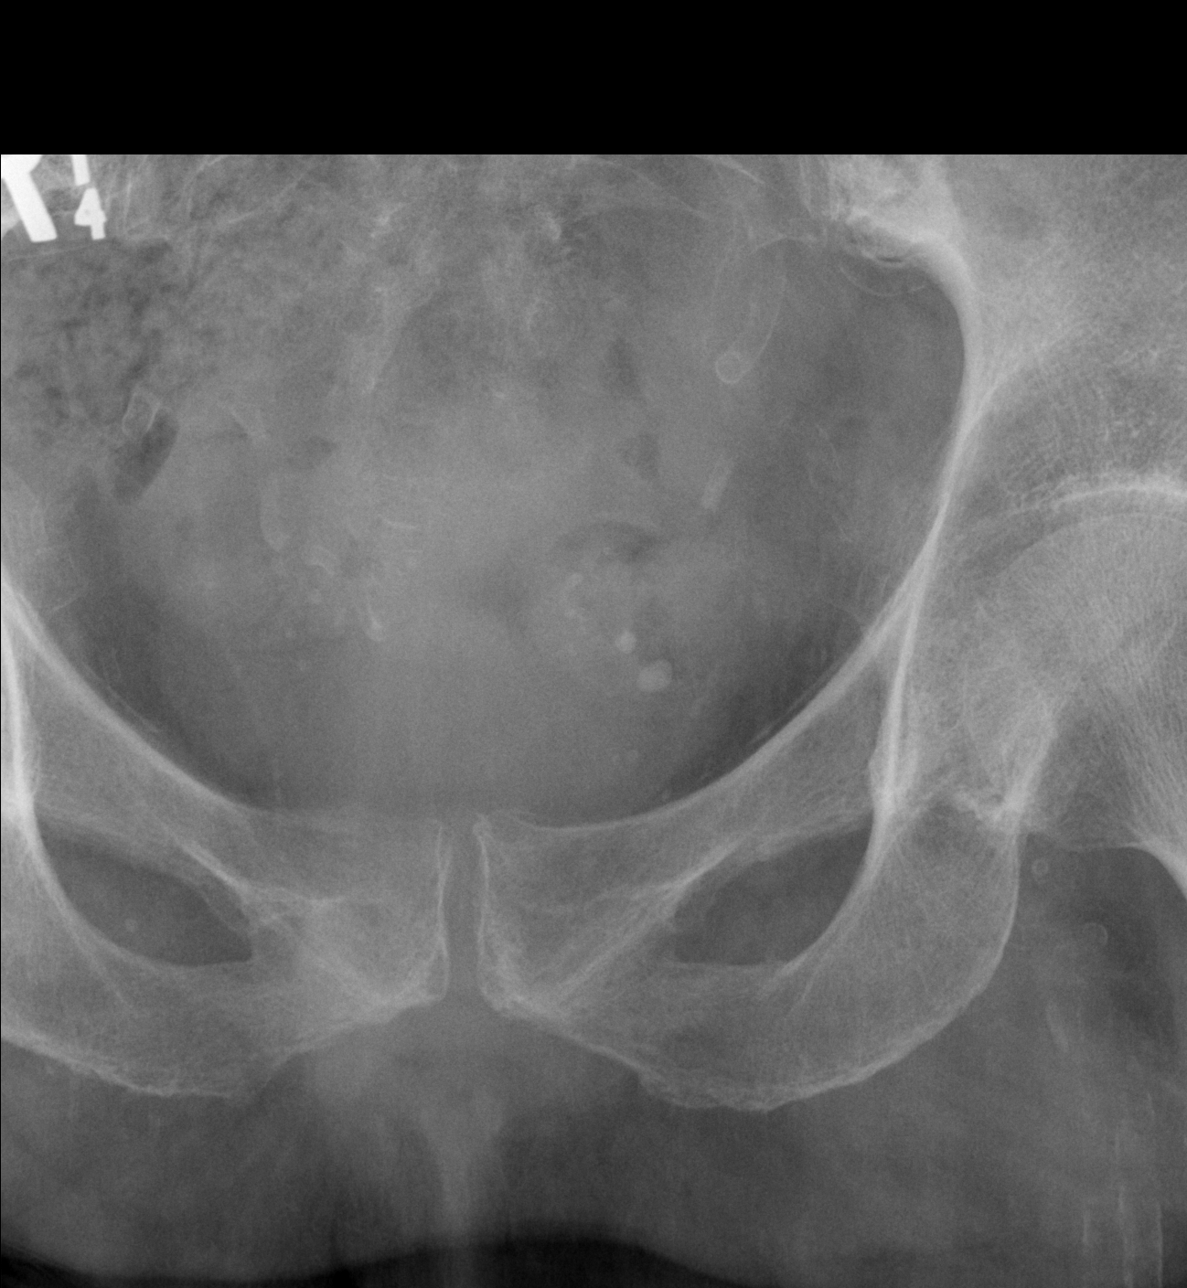

[t sacrum lat]
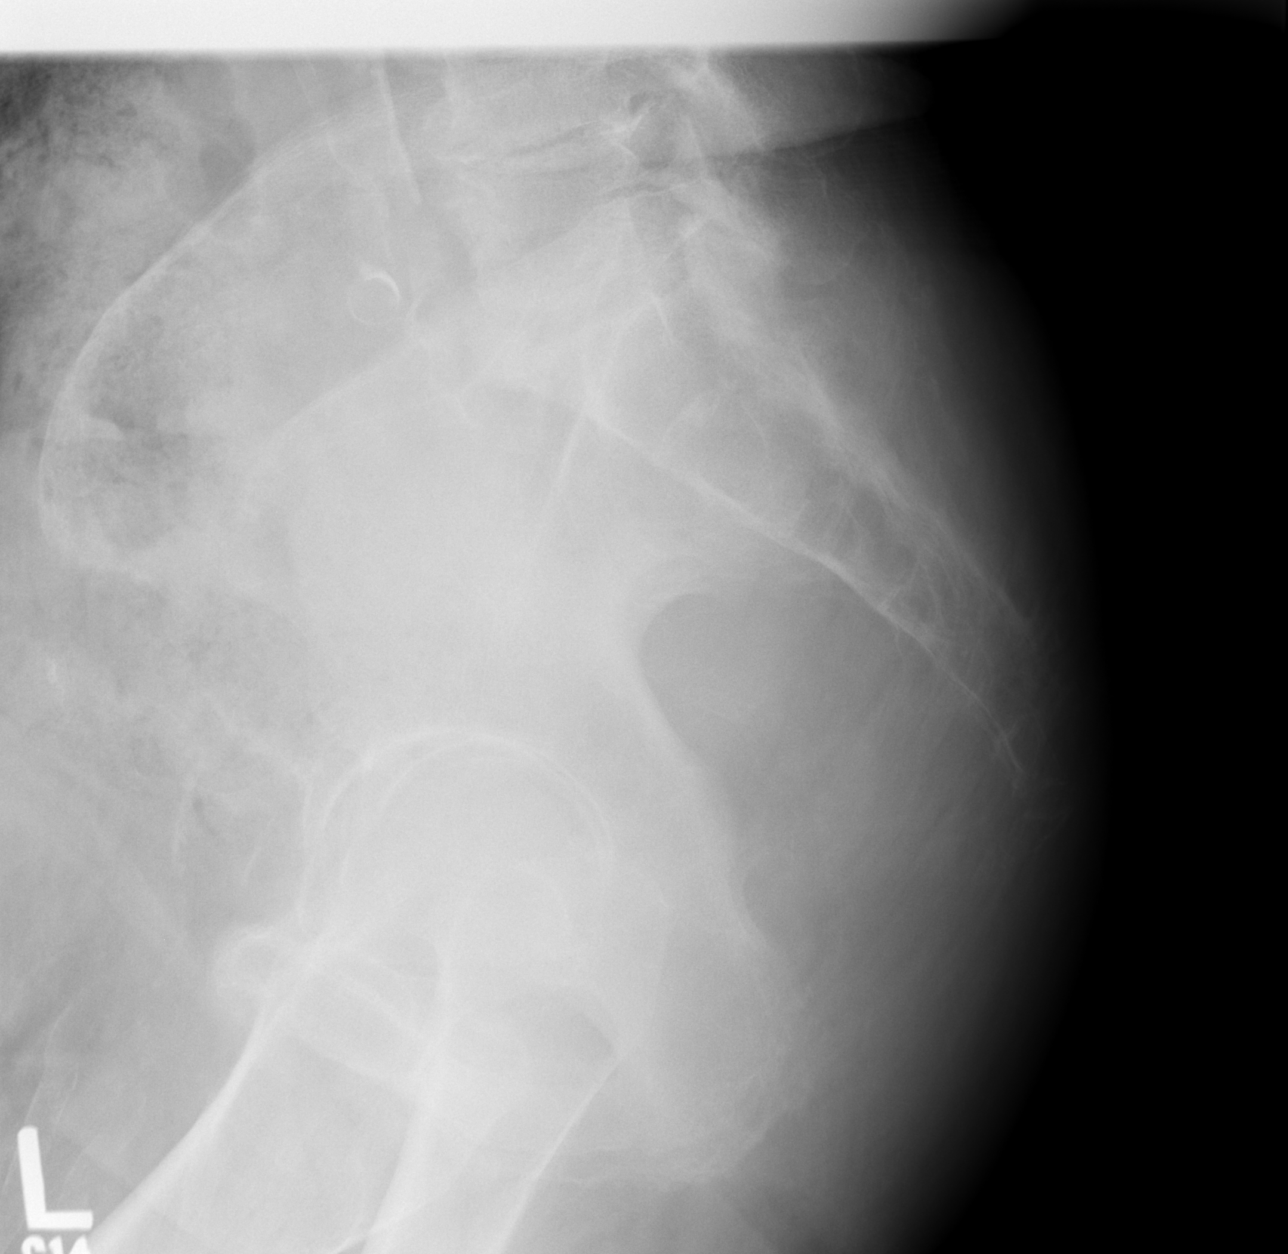

[3 of 3 positions shown; findings below may reference images not displayed]

FINDINGS: There is no evidence of fracture or other focal bone lesions.
Degenerative change of the lumbar spine better demonstrated on prior
CT. Similar LEFT iliac periarticular sclerosis. Severe vascular
calcifications. Phleboliths in the pelvis.
IMPRESSION: No fracture deformity.

## 2020-04-18 ENCOUNTER — Ambulatory Visit (INDEPENDENT_AMBULATORY_CARE_PROVIDER_SITE_OTHER): Payer: Medicare Other

## 2020-04-18 DIAGNOSIS — R55 Syncope and collapse: Secondary | ICD-10-CM | POA: Diagnosis not present

## 2020-04-19 ENCOUNTER — Other Ambulatory Visit: Payer: Self-pay

## 2020-04-19 ENCOUNTER — Telehealth: Payer: Self-pay

## 2020-04-19 ENCOUNTER — Ambulatory Visit (INDEPENDENT_AMBULATORY_CARE_PROVIDER_SITE_OTHER): Payer: Medicare Other | Admitting: Emergency Medicine

## 2020-04-19 DIAGNOSIS — R55 Syncope and collapse: Secondary | ICD-10-CM

## 2020-04-19 LAB — CUP PACEART INCLINIC DEVICE CHECK
Date Time Interrogation Session: 20211109155600
Implantable Pulse Generator Implant Date: 20190322

## 2020-04-19 NOTE — Telephone Encounter (Signed)
Patient reports she is asymptomatic at this time. She reports that  She had episodes of dizziness and weakness , no syncope, CP, SOB, or chest pressure. The episodes that lasted "a few minutes". She reports HR readings in the "40s" when she takes her BP at home after the episodes. Will schedule patient for in clinic device clinic check because home remote transmission is not possible at this time.

## 2020-04-19 NOTE — Progress Notes (Signed)
[facility] Summary Report / Loop Recorder 

## 2020-04-19 NOTE — Progress Notes (Signed)
Loop check in clinic due to report of episode of "dizziness and remote monitor unable to transmit information.. Battery status: OK. R-waves 0.67 mV. 1 symptom episode with EGM that showed SR, 0 tachy episodes, 0 pause episodes, 0 brady episodes. 0 AF episodes (0% burden). Monthly summary reports and ROV with 08/2020.Brady detection programmed to 40 bpm per GT. Patient will receive a new remote monitor in 7-10 buisness days from [employer].

## 2020-04-19 NOTE — Telephone Encounter (Signed)
Patient called in c/o feeling weak and dizzy and she states that Margarita Grizzle said it can be her heart rate has been low. Patient states she has been pushing the system activator for her ILR when she gets any symptoms. I have instructed her to send a new transmission, patient kept getting error code of 3230 and [id number] so we called tech support. We could not get it to work and they are going to send her a new one. Patient is still concerned about her symptoms. Please call patient back to reassure

## 2020-04-21 ENCOUNTER — Other Ambulatory Visit: Payer: Self-pay | Admitting: Endocrinology

## 2020-04-21 DIAGNOSIS — E1165 Type 2 diabetes mellitus with hyperglycemia: Secondary | ICD-10-CM

## 2020-04-21 DIAGNOSIS — Z794 Long term (current) use of insulin: Secondary | ICD-10-CM

## 2020-04-22 ENCOUNTER — Telehealth: Payer: Self-pay

## 2020-04-22 NOTE — Telephone Encounter (Signed)
Carelink alert- 4 Brady events w/ SB 40's. Loletha Grayer detection recently changed to 40bpm detection.  Only 1 EGM available.    Spoke with Hannah [name], she is waiting for new handheld receiver for her monitor.  Once she gets it, she will send manual transmission for review of missing EGMS.

## 2020-04-25 ENCOUNTER — Other Ambulatory Visit: Payer: Self-pay | Admitting: Nurse Practitioner

## 2020-04-25 ENCOUNTER — Telehealth: Payer: Self-pay | Admitting: Nurse Practitioner

## 2020-04-25 ENCOUNTER — Encounter: Payer: Self-pay | Admitting: *Deleted

## 2020-04-25 ENCOUNTER — Telehealth (HOSPITAL_COMMUNITY): Payer: Self-pay

## 2020-04-25 DIAGNOSIS — R55 Syncope and collapse: Secondary | ICD-10-CM

## 2020-04-25 DIAGNOSIS — R079 Chest pain, unspecified: Secondary | ICD-10-CM

## 2020-04-25 DIAGNOSIS — I5032 Chronic diastolic (congestive) heart failure: Secondary | ICD-10-CM

## 2020-04-25 DIAGNOSIS — I480 Paroxysmal atrial fibrillation: Secondary | ICD-10-CM

## 2020-04-25 DIAGNOSIS — I2510 Atherosclerotic heart disease of native coronary artery without angina pectoris: Secondary | ICD-10-CM

## 2020-04-25 NOTE — Telephone Encounter (Signed)
Manual transmission received. No new episodes noted.

## 2020-04-25 NOTE — Progress Notes (Addendum)
CARDIOLOGY OFFICE NOTE  Date:  05/09/2020    Hannah [name] Date of Birth: [birth year] Medical Record #[id number]  PCP:  [name], DO  Cardiologist:  Atilano Median (transitioning to [facility])  Chief Complaint  Patient presents with  . Follow-up    History of Present Illness: Hannah [name] is a 74 y.o. female who presents today for a one week check. Seen for Dr. Lovena Le.Primarily follows with me.She is going to transition back to Dr. Aundra Dubin in the [place] clinic.   She has a very complex/extensivehistory - this includes ahistory ofCAD s/p CABG (1998 with redo 2009- Dr. Cyndia Bent), bioprosthetic AVR in 3300, chronic diastolic CHF, asthma, carotid artery disease (1-39% by duplex 10/2017, due 2021), CKD stage IV(Dr. Posey Pronto), PAF- has been on Multaq in the past and now on amiodarone, atrial tachycardia, breast CA, orthostasis,chronicanemia, thrombocytopenia, mild dilation of ascending aorta by echo 10/2018, recurrent syncope/orthostasis,priorrecurrentrightpleural effusion requiring thoracentesis4/2020and 12/2020with subsequent Pleurex/talc by Dr. Cyndia Bent, HTN, GERD,&HLD.   I have followed her for many years - she has had periodic issues with volume overload, orthostasis and tachy palpitations.Progressive CKD.Last cath was in 2014 - grafts patent. Myoview in 2015 without ischemia.  She has had worsening anemia. She has ended up having multiple repeat breast biopsies that were negative following abnormal mammogram (after her breast cancer). She has had worsening orthostasis - to the point of injury - fell in our parking lot in 02/2019 with significant laceration to the head - she is back on Midodrine. Was not able to have colonoscopy due to those events. EF remains normal. She has had recurrent kidney stones that has resulted in worsening CKD. She requires periodic transfusions for her anemia.   Whenseen towards the end of June - trying to do more in the way of  activity. Had been switched from Retacrit to Aranesp by hematology. Feeling better with higher dose of Imdur from earlier this summer. She has not been vaccinated - half of her family has not - we had a long discussion about this.  When seen in August - had required more transfusions. Was using less Lasix. BP lower and although her chest pain seemed better with Imdur, she thought she was not tolerating so this was stopped after her visit. Unfortunately - she suffered a fall - has had to have kyphoplasty. Her kidney function continues to deteriorate. Discussed going back to Gi for more thorough evaluation given her ongoing anemia and difficulty maintaining her HGB.When seen in mid September - counts seemed to be dropping again - overall felt to be holding her own - but still pretty tenuous situation.   I saw her last week as a work in - she has had multiple spells of syncope - unclear etiology - loop has not been all that remarkable and has not correlated with her events. She has had a low risk Myoview and her echo has been updated and was ok as well. She has been referred back to Neurology.  I have arranged for her to see  Dr. Aundra Dubin as well. I have this suspicion that these spells are cardiac related and am concerned for worsening CAD - she has wanted to avoid contrast exposure due to her CKD. She does not tolerate Imdur due to dizziness. Metoprolol was stopped last week. Very tenuous situation.   Comes in today. Here with Hannah [name]. She is in a wheelchair today. Her spells of pre syncope have improved - she did have 2 "light" spells on Thanksgiving  day - one was at the table - had already eaten - (she has really cut back on her portions) - the back of her head starts hurting - she starts drawing her words - family has started getting her positioned with her head back - this seems to help minimize the intensity. Had 2nd spell with going to the car - family laid the seat back and put a pillow back behind her  head - got better and recovered pretty quickly. Has done ok since. Can't really remember any of these spells afterwards. Says has no recollection of the time span that goes by as well. Hannah [name] notes the 2 spells on Thursday were pretty mild compared to what she has had earlier this month. Had had a spell early Tuesday morning while getting up to go to the bathroom- he noted her lower body shaking at that time. Has had nothing since Thursday. It is getting hard for her to not be able to do anything independently - she is getting depressed. Tearful.   He notes that there has not been any "shaking" since her visit with neurology.  They are now worried that an inpatient EEG will be futile if she does not have any symptoms. Her chest pain seems to be at [place] but she does know that if she walks any distance - she will get a "weak" feeling in her chest. Not using Lasix. No swelling. Weight stable at home.   Past Medical History:  Diagnosis Date  . Abnormally small mouth   . Allergic rhinitis 10/14/2009   Qualifier: Diagnosis of  By: Lamonte Sakai MD, Rose Fillers   Overview:  Overview:  Qualifier: Diagnosis of  By: Lamonte Sakai MD, Rose Fillers  Last Assessment & Plan:  Please continue Xyzal and Nasacort as you have been using them  . Anemia   . Asthma 05/12/2009   10/12/2014 p extensive coaching HFA effectiveness =    75% s spacer    Overview:  Overview:  10/12/2014 p extensive coaching HFA effectiveness =    75% s spacer   Last Assessment & Plan:  Please continue Symbicort 2 puffs twice a day. Remember to rinse and gargle after taking this medication. Take albuterol 2 puffs up to every 4 hours if needed for shortness of breath.  Follow with Dr Lamonte Sakai in 6 month  . Bilateral carotid artery stenosis    a. mild 1-39% by duplex 10/2017, due 2021.  . Breast cancer ([place]) 01/18/2016   right breast  . CAD (coronary artery disease) 01/24/2011   a. s/p CABG 1998 with redo 2009.  Marland Kitchen Chemotherapy induced nausea and vomiting 02/24/2016  .  Chemotherapy-induced peripheral neuropathy ([place]) 04/27/2016  . 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 Chronic diastolic CHF (congestive heart failure) ([place]) 09/24/2013  . CKD (chronic kidney disease), stage IV ([place]) 09/24/2013  . Closed fracture of head of left humerus 09/09/2017  . Complication of anesthesia   . Dysrhythmia    a-fib  . Gallstones   . GERD (gastroesophageal reflux disease)   . Glaucoma   . H/O atrial tachycardia 05/12/2009   Qualifier: History of  By: Lamonte Sakai MD, Rose Fillers   Overview:  Overview:  Qualifier: History of  By: Lamonte Sakai MD, Rose Fillers  Last Assessment & Plan:  There was no evidence of this on her cardiac monitor. I would recommend watchful waiting.   Marland Kitchen Heart murmur   . History of kidney stones   . History of non-ST elevation myocardial infarction (NSTEMI)  Sept 2014--  thought to be type II HTN w/ LHC without infarct related artery and patent grafts  . History of radiation therapy 05/24/16-07/26/16   right breast 50.4 Gy in 28 fractions, right breast boost 10 Gy in 5 fractions  . Hyperlipidemia   . Hypotension   . Iron deficiency anemia   . Mania ([place]) 03/05/2016  . Moderate persistent asthma    pulmologist-  Dr. Malvin Johns  . Osteomyelitis of toe of left foot ([place]) 04/06/2016  . Osteopenia of multiple sites 10/19/2015  . PAF (paroxysmal atrial fibrillation) ([place])   . Personal history of chemotherapy 2017  . Personal history of radiation therapy 2017  . PONV (postoperative nausea and vomiting)   . Port catheter in place 02/17/2016  . Psoriasis    right leg  . Renal calculus, right   . S/P AVR    prosthesis valve placement 2009 at same time re-do CABG  . Seizure-like activity ([place]) 09/01/2017  . Sensorineural hearing loss (SNHL) of both ears 01/06/2016  . Stroke [facility]) 2014   residual rt hearing loss  . Syncope 09/06/2017  . Type 2 diabetes mellitus ([place])    monitored by dr Dwyane Dee    Past Surgical History:  Procedure Laterality Date  . AORTIC VALVE  REPLACEMENT  2009   [facility] (done same time is CABG)  . BREAST BIOPSY Right 02/12/2019   2 lymphnodes, 1 breast  . BREAST LUMPECTOMY Right 01/18/2016  . BREAST LUMPECTOMY WITH RADIOACTIVE SEED AND SENTINEL LYMPH NODE BIOPSY Right 01/18/2016   Procedure: RIGHT BREAST LUMPECTOMY WITH RADIOACTIVE SEED AND SENTINEL LYMPH NODE BIOPSY;  Surgeon: DAlphonsa Overall MD;  [place] [facility] [place]  Service: General;  Laterality: Right;  . CARDIAC CATHETERIZATION  03/23/2008   Pre-redo CABG: L main OK, LAD (T), CFX (T), OM1 99%, RCA (T), LIMA-LAD OK, SVG-OM(?3) OK w/ little florw to OM2, SVG-RCA OK. EF NL  . CARPAL TUNNEL RELEASE    . CHEST TUBE INSERTION Right 06/26/2019   Procedure: INSERTION PLEURAL DRAINAGE CATHETER;  Surgeon: BGaye Pollack MD;  [place] [facility]  Service: Thoracic;  Laterality: Right;  . CHOLECYSTECTOMY N/A 07/07/2018   Procedure: LAPAROSCOPIC CHOLECYSTECTOMY WITH INTRAOPERATIVE CHOLANGIOGRAM ERAS PATHWAY;  Surgeon: NAlphonsa Overall MD;  [facility];  Service: General;  Laterality: N/A;  . COLONOSCOPY     around 2015. Possibly with [place]   . CORONARY ARTERY BYPASS GRAFT  1998 &  re-do 2009   Had LIMA to DX/LAD, SVG to 2 marginal branches and [facility]; SVG to 3rd OM and PD at time of redo  . CYSTOSCOPY W/ URETERAL STENT PLACEMENT Right 12/20/2014   Procedure: CYSTOSCOPY WITH RETROGRADE PYELOGRAM/URETERAL STENT PLACEMENT;  Surgeon: PCleon Gustin MD;  [facility]  Service: Urology;  Laterality: Right;  . CYSTOSCOPY WITH RETROGRADE PYELOGRAM, URETEROSCOPY AND STENT PLACEMENT Left 08/21/2019   Procedure: CYSTOSCOPY WITH RETROGRADE PYELOGRAM, URETEROSCOPY AND STENT PLACEMENT;  Surgeon: MCleon Gustin MD;  [facility];  Service: Urology;  Laterality: Left;  1 HR  . ESOPHAGOGASTRODUODENOSCOPY     many years ago per patient   . ESOPHAGOGASTRODUODENOSCOPY ENDOSCOPY  06/17/2018  . EYE SURGERY Bilateral    cataracts   . HOLMIUM LASER APPLICATION Right 79/79/8921  Procedure:  HOLMIUM LASER LITHOTRIPSY;  Surgeon: PCleon Gustin MD;  [facility]  Service: Urology;  Laterality: Right;  . HOLMIUM LASER APPLICATION Left 31/94/1740  Procedure: HOLMIUM LASER APPLICATION;  Surgeon: Cleon Gustin, MD;  [facility];  Service: Urology;  Laterality: Left;  . IR KYPHO LUMBAR INC FX REDUCE BONE BX UNI/BIL CANNULATION INC/IMAGING  02/12/2020  . IR THORACENTESIS ASP PLEURAL SPACE W/IMG GUIDE  10/03/2018  . IR THORACENTESIS ASP PLEURAL SPACE W/IMG GUIDE  06/08/2019  . LEFT HEART CATHETERIZATION WITH CORONARY/GRAFT ANGIOGRAM N/A 02/23/2013   Procedure: LEFT HEART CATHETERIZATION WITH Beatrix Fetters;  Surgeon: Blane Ohara, MD;  Location: [facility] CATH LAB;  Service: Cardiovascular;  Laterality: N/A;  . LOOP RECORDER INSERTION N/A 08/30/2017   Procedure: LOOP RECORDER INSERTION;  Surgeon: Evans Lance, MD;  Location: [place] CV LAB;  Service: Cardiovascular;  Laterality: N/A;  . PORTACATH PLACEMENT Left 01/18/2016   Procedure: INSERTION PORT-A-CATH;  Surgeon: Alphonsa Overall, MD;  Location: [place];  Service: General;  Laterality: Left;  . portacath removal    . REMOVAL OF PLEURAL DRAINAGE CATHETER Right 07/14/2019   Procedure: REMOVAL OF PLEURAL DRAINAGE CATHETER;  Surgeon: Gaye Pollack, MD;  Location: [place];  Service: Thoracic;  Laterality: Right;  . TALC PLEURODESIS N/A 06/26/2019   Procedure: Pietro Cassis;  Surgeon: Gaye Pollack, MD;  [facility];  Service: Thoracic;  Laterality: N/A;  . TONSILLECTOMY    . TRANSTHORACIC ECHOCARDIOGRAM  02-24-2013      mild LVH,  ef 50-55%/  AV bioprosthesis was present with very mild stenosis and no regurg., mean grandient 77mHg, peak grandient 27mg /  mild MR/  mild LAE and RAE/  moderate TR  . TUBAL LIGATION       Medications: Current Meds  Medication Sig  . ACCU-CHEK GUIDE test strip Use to test blood sugar 3 times daily   . acetaminophen (TYLENOL) 500 MG tablet Take 1,000 mg by mouth every 6 (six) hours as needed for moderate pain.  . Marland Kitchenlbuterol (VENTOLIN HFA) 108 (90 Base) MCG/ACT inhaler Inhale 2 puffs into the lungs every 6 (six) hours as needed for wheezing or shortness of breath.  . Marland Kitchenmiodarone (PACERONE) 200 MG tablet TAKE ONE TABLET BY MOUTH DAILY  . apixaban (ELIQUIS) 2.5 MG TABS tablet Take 1 tablet (2.5 mg total) by mouth 2 (two) times daily.  . Blood Glucose Monitoring Suppl (ACCU-CHEK GUIDE ME) w/Device KIT Use accu chek meter to check blood sugar three times daily.  . budesonide-formoterol (SYMBICORT) 160-4.5 MCG/ACT inhaler Inhale 2 puffs into the lungs 2 (two) times daily.  . Cholecalciferol (DIALYVITE VITAMIN D 5000) 125 MCG (5000 UT) capsule Take 5,000 Units by mouth daily.  . Marland Kitchenxemestane (AROMASIN) 25 MG tablet TAKE ONE TABLET BY MOUTH DAILY AFTER BREAKFAST  . ezetimibe (ZETIA) 10 MG tablet TAKE ONE TABLET BY MOUTH EVERY DAY  . famotidine (PEPCID) 20 MG tablet Take 20 mg by mouth 2 (two) times daily.  . furosemide (LASIX) 20 MG tablet Take 20 mg by mouth daily as needed for fluid.   . Marland Kitchennsulin glargine (LANTUS) 100 UNIT/ML injection Inject 20 Units into the skin daily.   . Insulin Lispro (HUMALOG KWIKPEN [place]) Inject 6-8 Units into the skin 3 (three) times daily.   . Insulin Pen Needle (BD PEN NEEDLE NANO U/F) 32G X 4 MM MISC USE AS INSTRUCTED TO INJECT INSULIN 4 TIMES DAILY.  . Marland Kitchenoratadine (CLARITIN) 10 MG tablet Take 10 mg by mouth daily.   . midodrine (PROAMATINE) 2.5 MG tablet Take 2.5 mg by mouth 2 (two) times daily.   . nitroGLYCERIN (NITROSTAT) 0.4 MG SL tablet Place 1 tablet (0.4 mg total) under the  tongue every 5 (five) minutes as needed for chest pain.  Marland Kitchen ondansetron (ZOFRAN ODT) 4 MG disintegrating tablet Take 1 tablet (4 mg total) by mouth every 8 (eight) hours as needed.  . rosuvastatin (CRESTOR) 40 MG tablet Take 1 tablet (40 mg total) by mouth at bedtime.  . traMADol (ULTRAM) 50 MG  tablet Take 1 tablet (50 mg total) by mouth every 6 (six) hours as needed.  . TRULICITY 1.5 JX/9.1YN SOPN INJECT THE CONTENTS OF ONE  PEN SUBCUTANEOUSLY WEEKLY  AS DIRECTED  . vitamin B-12 (CYANOCOBALAMIN) 100 MCG tablet Take 100 mcg by mouth daily.  Marland Kitchen zolpidem (AMBIEN) 5 MG tablet Take 5 mg by mouth at bedtime.      Allergies: Allergies  Allergen Reactions  . Amoxicillin Rash and Other (See Comments)    Tolerates Cephalosporins Has patient had a PCN reaction causing immediate rash, facial/tongue/throat swelling, SOB or lightheadedness with hypotension: Yes Has patient had a PCN reaction causing severe rash involving mucus membranes or skin necrosis: Yes Has patient had a PCN reaction that required hospitalization No Has patient had a PCN reaction occurring within the last 10 years: No If all of the above answers are "NO", then may proceed with Cephalosporin use.   . Tape Other (See Comments)    Pulls off skin, must use paper tape  . Aldactone [Spironolactone] Other (See Comments)    CKD/hypokalemia  . Arimidex [Anastrozole] Nausea Only  . Latex Itching and Other (See Comments)    (Dentist office)  . Tetracycline Rash    Social History: The patient  reports that she has never smoked. She has never used smokeless tobacco. She reports that she does not drink alcohol and does not use drugs.   Family History: The patient's family history includes Diabetes in her maternal grandmother and son; Healthy in her brother; Heart attack in her brother; Heart disease in her brother, father, and maternal grandmother; Heart failure in her father.   Review of Systems: Please see the history of present illness.   All other systems are reviewed and negative.   Physical Exam: VS:  BP (!) 124/56   Pulse 92   Ht _0  (1.626 m)   Wt 135 lb (61.2 kg)   LMP  (LMP Unknown)   SpO2 96%   BMI 23.17 kg/m  .  BMI Body mass index is 23.17 kg/m.  Wt Readings from Last 3 Encounters:  05/09/20 135 lb  (61.2 kg)  05/03/20 132 lb 12.8 oz (60.2 kg)  05/02/20 131 lb 12.8 oz (59.8 kg)    General: Alert and in no acute distress.  Looks chronically ill.  Cardiac: Regular rate and rhythm. Harsh outflow murmur. No edema.  Respiratory:  Lungs are clear to auscultation bilaterally with normal work of breathing.  GI: Soft and nontender.  MS: No deformity or atrophy. Gait not tested.  Skin: Warm and dry. Color is sallow.  Neuro:  Strength and sensation are intact and no gross focal deficits noted.  Psych: Alert, appropriate and with normal affect.   LABORATORY DATA:  EKG:  EKG is not ordered today.    Lab Results  Component Value Date   WBC 5.8 03/25/2020   HGB 10.6 (L) 03/25/2020   HCT 35.2 (L) 03/25/2020   PLT 126 (L) 03/25/2020   GLUCOSE 210 (H) 04/04/2020   CHOL 106 02/23/2020   TRIG 74 02/23/2020   HDL 33 (L) 02/23/2020   LDLDIRECT 53.0 10/08/2016   LDLCALC 58 02/23/2020   ALT 58 (  H) 04/04/2020   AST 69 (H) 04/04/2020   NA 139 04/04/2020   K 4.7 04/04/2020   CL 105 04/04/2020   CREATININE 2.85 (H) 04/04/2020   BUN 38 (H) 04/04/2020   CO2 28 04/04/2020   TSH 2.47 04/04/2020   INR 1.2 02/12/2020   HGBA1C 7.1 (H) 04/04/2020   MICROALBUR 14.2 (H) 07/02/2019     BNP (last 3 results) No results for input(s): BNP in the last 8760 hours.  ProBNP (last 3 results) No results for input(s): PROBNP in the last 8760 hours.   Other Studies Reviewed Today:  ECHO IMPRESSIONS 04/2020  1. Left ventricular ejection fraction, by estimation, is 55 to 60%. The  left ventricle has normal function. The left ventricle has no regional  wall motion abnormalities. There is mild concentric left ventricular  hypertrophy. Left ventricular diastolic  parameters are consistent with Grade I diastolic dysfunction (impaired  relaxation). Elevated left atrial pressure.  2. Right ventricular systolic function is normal. The right ventricular  size is normal. Tricuspid regurgitation signal is  inadequate for assessing  PA pressure.  3. Left atrial size was mild to moderately dilated.  4. The mitral valve is normal in structure. Trivial mitral valve  regurgitation. No evidence of mitral stenosis. Moderate mitral annular  calcification.  5. The aortic valve has been repaired/replaced. Aortic valve  regurgitation is not visualized. No aortic stenosis is present. There is a  21 mm Edwards bioprosthetic valve present in the aortic position.  Procedure Date: 2009. Aortic valve mean gradient  measures 8.3 mmHg. Aortic valve Vmax measures 2.02 m/s.  6. The inferior vena cava is normal in size with greater than 50%  respiratory variability, suggesting right atrial pressure of 3 mmHg.   Comparison(s): No significant change from prior study.  Myoview Study Highlights 04/2020    The left ventricular ejection fraction is normal (55-65%).  Nuclear stress EF: 61%.  There was no ST segment deviation noted during stress.  The study is normal.  This is a low risk study.   Normal stress nuclear study with no ischemia or infarction.  Gated ejection fraction 61% with normal wall motion.  CT CHEST IMPRESSION 04/2019:  Moderate right pleural effusion, increased from prior CT. Associated  right lower lobe atelectasis.  No evidence of pneumonia.  1.2 x 2.4 cm lesion along the inferior aspect of the right  breast/chest wall. While additional findings noted above correlate  with areas of fat necrosis, this lesion was not clearly identified  when correlating with prior breast tomography report. Breast imaging  consultation is suggested for further evaluation, including  potential ultrasound with biopsy versus breast MR. These results  will be called to the ordering clinician or representative by the  Radiologist Assistant, and communication documented in the PACS or  zVision Dashboard.  Aortic Atherosclerosis (ICD10-I70.0).  Electronically Signed  By: Julian Hy M.D.  On:  05/06/2019 18:11    CT ABDOMEN IMPRESSION 03/2019: Chest Impression:  1. Mild RIGHT basilar atelectasis and small RIGHT effusion.  2. Increased skin thickening in the lateral RIGHT breast. Recommend  clinical correlation.  3. Stable seroma in the RIGHT breast.   Abdomen / Pelvis Impression:  1. No acute findings in the abdomen pelvis.  2. Nonobstructing calculi in the LEFT renal pelvis.  3. Degenerative endplate change in the lumbar spine similar to  comparison CT 2019.  4. Coronary artery calcification and Aortic Atherosclerosis  (ICD10-I70.0).  Electronically Signed  By: Suzy Bouchard M.D.  On: 03/17/2019 09:59  ABD [place] 02/17/2019  Other findings: There is a somewhat lobulated fluid collection  identified along the inferior aspect of the liver. This measures 2.6  x 3.4 x 4.3 cm. This was not seen on the prior CT examination and  may be related to a focal fluid collection from prior  cholecystectomy. Mild perihepatic ascites is noted.  Note is made of right-sided pleural effusion.  IMPRESSION:  Status post cholecystectomy. There is a lobulated cystic area as  described above along the inferior right hepatic margin. This could  be related to the prior surgery possibly representing a small  seroma. CT may be helpful for further evaluation.  Small right pleural effusion.  Mild ascites.  Increased echogenicity within the liver likely related to fatty  infiltration or underlying hepatocellular disease.  Electronically Signed  By: Inez Catalina M.D.  On: 02/17/2019 10:18    Breast Ultrasound 01/2019  IMPRESSION:  1. New irregular hypoechoic mass along the posterior margin of the  scar right breast 8 o'clock position. Findings are nonspecific  however concerning for the possibility of localized recurrence.  2. Within the outer aspect of the right axilla there are two  adjacent irregular hypoechoic masses just deep to the skin which are  nonspecific. These may  represent focal fat necrosis or potentially  recurrent or metastatic disease.  RECOMMENDATION:  1. Ultrasound-guided core needle biopsy right breast mass 8 o'clock  position just deep to the scar.  2. Ultrasound-guided core needle biopsy of both irregular hypoechoic  masses within the outer aspect of the right axilla.    MYOVIEW FINDINGS FROM 06/2013:  Normal resting EKG. Slight ST depressions noted in aVL after  administration of lexiscan. Mild shortness of breath and nausea  resolved after the test. EKG is nondiagnostic for ischemia. TID  ratio 1.05. Lung-heart ratio 0.43. Normal ventricular chamber size.  IMPRESSION:  No evidence for ischemia. Normal wall motion. LVEF 72%.  Electronically Signed  By: Guy Sandifer    Cardiac Cath: 02/23/2013  Left mainstem: Normal  Left anterior descending (LAD): Severe proximal and mid calcification with long proximal 95% stenosis. The mid and distal vessel is small but free of high grade disease.  Left circumflex (LCx): AV groove has a mid 90% stenosis before a moderate sized MOM. OM1 and OM2 are occluded at the ostium and fill via the SVG. OM3 occluded at the ostium and fills via SVG. The grafted OMs are small and diffusely disease.  Right coronary artery (RCA): Occluded in the mid vessel. The PDA is moderate sized and occluded at the ostium. There is a 70% stenosis in the PDA after the insertion of the vein graft.  Grafts:  LIMA to LAD: Patent  SVG to RCA: Occluded (from original CABG)  SVG to RCA: Patent (from redo CABG). This is mild diffuse plaque within the vein graft.  SVG to OM3: Patent. This is mild diffuse plaque within the vein graft.  SVG sequential to OM1/OM2: Patent. There is ostial 50% stenosis. There is a patent proximal SVG stent. The native marginals are very small.  Left ventriculography: LV not injected. AVR not crossed.  Final Conclusions: Severe native vessel CAD. Patent grafts as described with nonobstructive disease in the  grafts. A stent placed into the sequential SVG is patent with some disease at the ostium that on several views in not occlusive. I did compare the 2009 cath with results today. There is high grade disease in the circumflex AV groove. However, this is not changed from 2009. However,  this lesion does appear to lead into a non grafted OM. If she has further symptoms I would consider PCI of the native circumflex.     Assessment/Plan:  1. Recurrent syncope - unclear etiology - no pauses noted on her loop - she has had some bradycardia identified and as well junctional rhythm but not felt to be the source of these spells - we have stopped her Metoprolol. She has seen neurology - will be having in patient EEG - they are worried that this will not show anything. Not sure if she needs MRI head/neck - would probably need contrast - very challenging situation.   2. CAD - prior CABG 1998 and then redo in 2009 - her last cath was in 2014 - recent low risk Myoview - I am not convinced that this is not ischemia. We have very limited ability to manage medically. Does not tolerate Imdur. Metoprolol stopped - see above.   3. CKD- followed by Nephrology - she is aware that she would be at increased risk for further deterioration of her kidney function if cardiac cath is needed. She would need to be staged for cath/PCI if needed.   4. Elevated LFTs - rechecking today - she is on amiodarone.   5. Anemia - followed by Hematology - was never able to get her colonoscopy due to prior fall with subsequent concussion.   6. Chronic orthostatic hypotension - on Midodrine TID. Stopped Metoprolol last week. This has been very challenging - she is using support stockings. No longer needing diuretics. Unfortunately, she is getting much more sedentary as a result which puts her more at risk.   7. Carotid disease - this was just updated and was stable - ?if she needs transcranial doppler study??  8. PAF - remains on low  dose amiodarone - we have not seen recurrence of this. Loop in place - lab today.   9. Breast cancer - not discussed today.   10. Chronic anticoagulation  11. Chronic diastolic HF - her weight has been staying down.   I still think this is very challenging. We are transitioning her over to CHF - seeing Dr. Aundra Dubin in about a month. I think "fresh eyes" will be helpful. I still think her overall situation is very tenuous at best. Will ask Dr. Delice Lesch to weigh in on MRI head/neck.    Current medicines are reviewed with the patient today.  The patient does not have concerns regarding medicines other than what has been noted above.  The following changes have been made:  See above.  Labs/ tests ordered today include:    Orders Placed This Encounter  Procedures  . Basic metabolic panel  . CBC  . Hepatic function panel     Disposition:   FU with me in about 3 weeks. She has follow up with Dr. Aundra Dubin in early January. Waiting on inpatient EEG. Lab here today.    Addendum: Dr. Carles Collet (covering for [facility]) does not feel MRi is warranted.   Patient is agreeable to this plan and will call if any problems develop in the interim.   SignedTruitt Merle, NP  05/09/2020 3:07 PM  [facility] [address] [place], [place]  [address] Phone: [phone] Fax: [phone]

## 2020-04-25 NOTE — Telephone Encounter (Signed)
Please see her my chart messages - I have sat down this afternoon with Hannah [name] with EP - regarding her loop transmissions. She had 3 spells last Tuesday - lasting 15, 28 and 7 seconds respectively with HR in the mid 30's. One spell last Wednesday November 10. No spells noted from the weekend off the device and she has reported having recurrence.   We are proceeding on with Lexiscan, echo and CT of the head without contrast.   We are cutting Lopressor back to 25 mg BID - holding tonight - will start this in the AM.   Will see what her studies show, then decide about where/how to proceed.   Hannah [name] is agreeable.   [name], RN, [facility] [address] [place], [place]  [id number] [phone]

## 2020-04-25 NOTE — Telephone Encounter (Signed)
Attempted to contact the patient. No answer. S.Ulises Wolfinger EMTP

## 2020-04-26 ENCOUNTER — Ambulatory Visit (HOSPITAL_COMMUNITY): Payer: Medicare Other | Attending: Cardiovascular Disease

## 2020-04-26 ENCOUNTER — Ambulatory Visit (HOSPITAL_BASED_OUTPATIENT_CLINIC_OR_DEPARTMENT_OTHER): Payer: Medicare Other

## 2020-04-26 ENCOUNTER — Ambulatory Visit (INDEPENDENT_AMBULATORY_CARE_PROVIDER_SITE_OTHER)
Admission: RE | Admit: 2020-04-26 | Discharge: 2020-04-26 | Disposition: A | Discharge status: Home / Self Care | Payer: Medicare Other | Source: Ambulatory Visit | Attending: Nurse Practitioner | Admitting: Nurse Practitioner

## 2020-04-26 ENCOUNTER — Other Ambulatory Visit: Payer: Self-pay

## 2020-04-26 VITALS — Ht 64.0 in

## 2020-04-26 DIAGNOSIS — R55 Syncope and collapse: Secondary | ICD-10-CM

## 2020-04-26 DIAGNOSIS — R079 Chest pain, unspecified: Secondary | ICD-10-CM | POA: Insufficient documentation

## 2020-04-26 DIAGNOSIS — I480 Paroxysmal atrial fibrillation: Secondary | ICD-10-CM | POA: Diagnosis not present

## 2020-04-26 DIAGNOSIS — R110 Nausea: Secondary | ICD-10-CM | POA: Insufficient documentation

## 2020-04-26 DIAGNOSIS — I5032 Chronic diastolic (congestive) heart failure: Secondary | ICD-10-CM | POA: Diagnosis not present

## 2020-04-26 DIAGNOSIS — I2510 Atherosclerotic heart disease of native coronary artery without angina pectoris: Secondary | ICD-10-CM | POA: Insufficient documentation

## 2020-04-26 LAB — MYOCARDIAL PERFUSION IMAGING
LV dias vol: 54 mL (ref 46–106)
LV sys vol: 21 mL
Peak HR: 93 {beats}/min
Rest HR: 69 {beats}/min
SDS: 3
SRS: 1
SSS: 4
TID: 0.92

## 2020-04-26 LAB — ECHOCARDIOGRAM COMPLETE
AV Mean grad: 8.3 mmHg
AV Peak grad: 16.4 mmHg
Ao pk vel: 2.02 m/s
Area-P 1/2: 2.5 cm2
Height: 64 in
S' Lateral: 3.3 cm

## 2020-04-26 MED ORDER — REGADENOSON 0.4 MG/5ML IV SOLN
0.4000 mg | Freq: Once | INTRAVENOUS | Status: AC
  Administered 2020-04-26: 0.4 mg via INTRAVENOUS

## 2020-04-26 MED ORDER — TECHNETIUM TC 99M TETROFOSMIN IV KIT
10.6000 | PACK | Freq: Once | INTRAVENOUS | Status: AC | PRN
  Administered 2020-04-26: 10.6 via INTRAVENOUS
  Filled 2020-04-26: qty 11

## 2020-04-26 MED ORDER — TECHNETIUM TC 99M TETROFOSMIN IV KIT
30.7000 | PACK | Freq: Once | INTRAVENOUS | Status: AC | PRN
  Administered 2020-04-26: 30.7 via INTRAVENOUS
  Filled 2020-04-26: qty 31

## 2020-04-26 MED ORDER — AMINOPHYLLINE 25 MG/ML IV SOLN
75.0000 mg | Freq: Once | INTRAVENOUS | Status: AC
  Administered 2020-04-26: 75 mg via INTRAVENOUS

## 2020-04-27 ENCOUNTER — Telehealth: Payer: Self-pay

## 2020-04-27 NOTE — Progress Notes (Signed)
CARDIOLOGY OFFICE NOTE  Date:  05/02/2020    Hannah [name] [place] Date of Birth: [birth year] Medical Record #[id number]  PCP:  Hannah Margarita, DO  Cardiologist:  Hannah [name]   Chief Complaint  Patient presents with  . Follow-up    History of Present Illness: Hannah [name] is a 74 y.o. female who presents today for a work in visit. Seen for Dr. Lovena [name].Primarily follows with me.Former patient of Dr. Susa [name] and Dr. Claris [name].   She has a very complex/extensivehistory - this includes ahistory ofCAD s/p CABG (1998 with redo 2009- Dr. Cyndia [name]), bioprosthetic AVR in 7425, chronic diastolic CHF, asthma, carotid artery disease (1-39% by duplex 10/2017, due 2021), CKD stage IV(Dr. Posey [name]), PAF- has been on Multaq in the past and now on amiodarone, atrial tachycardia, breast CA, chronicanemia, thrombocytopenia, mild dilation of ascending aorta by echo 10/2018, recurrent syncope/orthostasis,priorrecurrentrightpleural effusion requiring thoracentesis4/2020and 12/2020with subsequent Pleurex/talc by Dr. Cyndia [name], HTN, GERD,&HLD.   I have followed her for many years - she has had periodic issues with volume overload, orthostasis and tachy palpitations.Progressive CKD - followed by Dr. Posey [name].Last cath in 2014 - grafts patent. Myoview in 2021 without ischemia.  She has had worsening anemia. She has ended up having multiple repeat breast biopsies that were negative following abnormal mammogram (after her breast cancer). She has had worsening orthostasis - to the point of injury - fell in our parking lot in 02/2019 with significant laceration to the head - she is back on Midodrine. Was not able to have colonoscopy due to those events. EF remains normal. She has had recurrent kidney stones that has resulted in worsening CKD. She requires periodic transfusions for her anemia and is now back on Procrit.   Whenseen towards the end of June - trying to do more in the way of activity. Had  been switched from Retacrit to Aranesp by hematology. Feeling better with higher dose of Imdur from earlier this summer. She has not been vaccinated - half of her family has not - we had a long discussion about this.  When seen in August - had required more transfusions. Was using less Lasix. BP lower and although her chest pain seemed better with Imdur, she thought she was not tolerating so this was stopped after her visit. Unfortunately - she suffered a fall - has had to have kyphoplasty. Her kidney function continues to deteriorate. Discussed going back to GI for more thorough evaluation given her ongoing anemia and difficulty maintaining her HGB.Last seen in mid September - counts seemed to be dropping again - overall felt to be holding her own - but still pretty tenuous situation.   She was last seen a month ago - having more angina - has not tolerated Imdur (even at low dose) in the past due to dizziness and hypotension - counts staying up - we have ended up increasing the Lopressor to TID along with her Midodrine.   Over the last 2 weeks - feeling pretty poorly - more spells of syncope - no real defining events from her loop - had had some bradycardia but no pauses. Echo, Myoview and CT of the head all updated - no real change or anything worrisome noted. Discussed with Dr. Lovena [name] - no indication for PPM - we have both felt that HR in the mid to high 30's to 40's would not cause this - her syncope from the weekend did not trigger anything as well. Options felt to be very limited.   She  had recurrent/witnessed event this past Wednesday - her loop was unremarkable. She did not wish to go to the ER. Neuro referral has been made.   She had an alert on her loop on Friday - looks to have had junctional rhythm - she did not pass out with this. Seeing Neurology tomorrow.   Comes in today. Here with Hannah [name]. Very challenging situation. She has prodrome of neck pain - then passes out. When comes to - her  neck pain is resolved. Having 3 to 4 spells a day - usually when up walking - but has had 2 spells while eating dinner and was sitting (those were earlier this month). Will do some heaving afterwards - no tongue biting or loss of bowel or bladder function. He has noted some jerking of her legs. Metoprolol has been cut to BID - Dr. Lovena [name] has advised stopping altogether. She is very limited in mobility - basically sitting all day - goes to the bathroom. She has not gotten hurt. Has seen Nephrology recently and had a good report.   Past Medical History:  Diagnosis Date  . Abnormally small mouth   . Allergic rhinitis 10/14/2009   Qualifier: Diagnosis of  By: Lamonte Sakai MD, Rose Fillers   Overview:  Overview:  Qualifier: Diagnosis of  By: Lamonte Sakai MD, Rose Fillers  Last Assessment & Plan:  Please continue Xyzal and Nasacort as you have been using them  . Anemia   . Asthma 05/12/2009   10/12/2014 p extensive coaching HFA effectiveness =    75% s spacer    Overview:  Overview:  10/12/2014 p extensive coaching HFA effectiveness =    75% s spacer   Last Assessment & Plan:  Please continue Symbicort 2 puffs twice a day. Remember to rinse and gargle after taking this medication. Take albuterol 2 puffs up to every 4 hours if needed for shortness of breath.  Follow with Dr Lamonte Sakai in 6 month  . Bilateral carotid artery stenosis    a. mild 1-39% by duplex 10/2017, due 2021.  . Breast cancer ([place]) 01/18/2016   right breast  . CAD (coronary artery disease) 01/24/2011   a. s/p CABG 1998 with redo 2009.  Marland Kitchen Chemotherapy induced nausea and vomiting 02/24/2016  . Chemotherapy-induced peripheral neuropathy ([place]) 04/27/2016  . 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 Chronic diastolic CHF (congestive heart failure) ([place]) 09/24/2013  . CKD (chronic kidney disease), stage IV ([place]) 09/24/2013  . Closed fracture of head of left humerus 09/09/2017  . Complication of anesthesia   . Dysrhythmia    a-fib  . Gallstones   . GERD  (gastroesophageal reflux disease)   . Glaucoma   . H/O atrial tachycardia 05/12/2009   Qualifier: History of  By: Lamonte Sakai MD, Rose Fillers   Overview:  Overview:  Qualifier: History of  By: Lamonte Sakai MD, Rose Fillers  Last Assessment & Plan:  There was no evidence of this on her cardiac monitor. I would recommend watchful waiting.   Marland Kitchen Heart murmur   . History of kidney stones   . History of non-ST elevation myocardial infarction (NSTEMI)    Sept 2014--  thought to be type II HTN w/ LHC without infarct related artery and patent grafts  . History of radiation therapy 05/24/16-07/26/16   right breast 50.4 Gy in 28 fractions, right breast boost 10 Gy in 5 fractions  . Hyperlipidemia   . Hypotension   . Iron deficiency anemia   . Mania ([place]) 03/05/2016  . Moderate persistent asthma  pulmologist-  Dr. Malvin Johns  . Osteomyelitis of toe of left foot ([place]) 04/06/2016  . Osteopenia of multiple sites 10/19/2015  . PAF (paroxysmal atrial fibrillation) ([place])   . Personal history of chemotherapy 2017  . Personal history of radiation therapy 2017  . PONV (postoperative nausea and vomiting)   . Port catheter in place 02/17/2016  . Psoriasis    right leg  . Renal calculus, right   . S/P AVR    prosthesis valve placement 2009 at same time re-do CABG  . Seizure-like activity ([place]) 09/01/2017  . Sensorineural hearing loss (SNHL) of both ears 01/06/2016  . Stroke [facility]) 2014   residual rt hearing loss  . Syncope 09/06/2017  . Type 2 diabetes mellitus ([place])    monitored by dr Dwyane Dee    Past Surgical History:  Procedure Laterality Date  . AORTIC VALVE REPLACEMENT  2009   [facility] (done same time is CABG)  . BREAST BIOPSY Right 02/12/2019   2 lymphnodes, 1 breast  . BREAST LUMPECTOMY Right 01/18/2016  . BREAST LUMPECTOMY WITH RADIOACTIVE SEED AND SENTINEL LYMPH NODE BIOPSY Right 01/18/2016   Procedure: RIGHT BREAST LUMPECTOMY WITH RADIOACTIVE SEED AND SENTINEL LYMPH NODE BIOPSY;  Surgeon:  DAlphonsa Overall MD;  [place] [place]  Service: General;  Laterality: Right;  . CARDIAC CATHETERIZATION  03/23/2008   Pre-redo CABG: L main OK, LAD (T), CFX (T), OM1 99%, RCA (T), LIMA-LAD OK, SVG-OM(?3) OK w/ little florw to OM2, SVG-RCA OK. EF NL  . CARPAL TUNNEL RELEASE    . CHEST TUBE INSERTION Right 06/26/2019   Procedure: INSERTION PLEURAL DRAINAGE CATHETER;  Surgeon: BGaye Pollack MD;  [facility]  Service: Thoracic;  Laterality: Right;  . CHOLECYSTECTOMY N/A 07/07/2018   Procedure: LAPAROSCOPIC CHOLECYSTECTOMY WITH INTRAOPERATIVE CHOLANGIOGRAM ERAS PATHWAY;  Surgeon: NAlphonsa Overall MD;  Location[facility];  Service: General;  Laterality: N/A;  . COLONOSCOPY     around 2015. Possibly with [facility]   . CORONARY ARTERY BYPASS GRAFT  1998 &  re-do 2009   Had LIMA to DX/LAD, SVG [facility] 2 marginal [facility]; SVG to 3rd OM and PD at time of redo  . CYSTOSCOPY W/ URETERAL STENT PLACEMENT Right 12/20/2014   Procedure: CYSTOSCOPY WITH RETROGRADE PYELOGRAM/URETERAL STENT PLACEMENT;  Surgeon: PCleon Gustin MD;  [facility]  Service: Urology;  Laterality: Right;  . CYSTOSCOPY WITH RETROGRADE PYELOGRAM, URETEROSCOPY AND STENT PLACEMENT Left 08/21/2019   Procedure: CYSTOSCOPY WITH RETROGRADE PYELOGRAM, URETEROSCOPY AND STENT PLACEMENT;  Surgeon: MCleon Gustin MD;  [facility];  Service: Urology;  Laterality: Left;  1 HR  . ESOPHAGOGASTRODUODENOSCOPY     many years ago per patient   . ESOPHAGOGASTRODUODENOSCOPY ENDOSCOPY  06/17/2018  . EYE SURGERY Bilateral    cataracts  . HOLMIUM LASER APPLICATION Right 71/07/7251  Procedure:  HOLMIUM LASER LITHOTRIPSY;  Surgeon: PCleon Gustin MD;  [facility]  Service: Urology;  Laterality: Right;  . HOLMIUM LASER APPLICATION Left 36/64/4034  Procedure: HOLMIUM LASER APPLICATION;  Surgeon: MCleon Gustin MD;  [facility];  Service: Urology;  Laterality: Left;  .  IR KYPHO LUMBAR INC FX REDUCE BONE BX UNI/BIL CANNULATION INC/IMAGING  02/12/2020  . IR THORACENTESIS ASP PLEURAL SPACE W/IMG GUIDE  10/03/2018  . IR THORACENTESIS ASP PLEURAL SPACE W/IMG GUIDE  06/08/2019  . LEFT HEART CATHETERIZATION WITH CORONARY/GRAFT ANGIOGRAM N/A 02/23/2013   Procedure: LEFT HEART CATHETERIZATION WITH  Beatrix Fetters;  Surgeon: Blane Ohara, MD;  Location[facility] CATH LAB;  Service: Cardiovascular;  Laterality: N/A;  . LOOP RECORDER INSERTION N/A 08/30/2017   Procedure: LOOP RECORDER INSERTION;  Surgeon: Evans Lance, MD;  Location: [place] CV LAB;  Service: Cardiovascular;  Laterality: N/A;  . PORTACATH PLACEMENT Left 01/18/2016   Procedure: INSERTION PORT-A-CATH;  Surgeon: Alphonsa Overall, MD;  Location: [place];  Service: General;  Laterality: Left;  . portacath removal    . REMOVAL OF PLEURAL DRAINAGE CATHETER Right 07/14/2019   Procedure: REMOVAL OF PLEURAL DRAINAGE CATHETER;  Surgeon: Gaye Pollack, MD;  Location: [place];  Service: Thoracic;  Laterality: Right;  . TALC PLEURODESIS N/A 06/26/2019   Procedure: Pietro Cassis;  Surgeon: Gaye Pollack, MD;  Location[facility];  Service: Thoracic;  Laterality: N/A;  . TONSILLECTOMY    . TRANSTHORACIC ECHOCARDIOGRAM  02-24-2013      mild LVH,  ef 50-55%/  AV bioprosthesis was present with very mild stenosis and no regurg., mean grandient 71mHg, peak grandient 253mg /  mild MR/  mild LAE and RAE/  moderate TR  . TUBAL LIGATION       Medications: Current Meds  Medication Sig  . ACCU-CHEK GUIDE test strip Use to test blood sugar 3 times daily  . acetaminophen (TYLENOL) 500 MG tablet Take 1,000 mg by mouth every 6 (six) hours as needed for moderate pain.  . Marland Kitchenlbuterol (VENTOLIN HFA) 108 (90 Base) MCG/ACT inhaler Inhale 2 puffs into the lungs every 6 (six) hours as needed for wheezing or shortness of breath.  . Marland Kitchenmiodarone (PACERONE) 200 MG tablet TAKE ONE TABLET BY MOUTH DAILY  . apixaban (ELIQUIS) 2.5 MG TABS tablet  Take 1 tablet (2.5 mg total) by mouth 2 (two) times daily.  . Blood Glucose Monitoring Suppl (ACCU-CHEK GUIDE ME) w/Device KIT Use accu chek meter to check blood sugar three times daily.  . budesonide-formoterol (SYMBICORT) 160-4.5 MCG/ACT inhaler Inhale 2 puffs into the lungs 2 (two) times daily.  . Cholecalciferol (DIALYVITE VITAMIN D 5000) 125 MCG (5000 UT) capsule Take 5,000 Units by mouth daily.  . Marland Kitchenxemestane (AROMASIN) 25 MG tablet TAKE ONE TABLET BY MOUTH DAILY AFTER BREAKFAST  . ezetimibe (ZETIA) 10 MG tablet TAKE ONE TABLET BY MOUTH EVERY DAY  . famotidine (PEPCID) 20 MG tablet Take 20 mg by mouth 2 (two) times daily.  . furosemide (LASIX) 20 MG tablet Take 20 mg by mouth daily as needed for fluid.   . Marland Kitchennsulin glargine (LANTUS) 100 UNIT/ML injection Inject 20 Units into the skin daily.   . Insulin Lispro (HUMALOG KWIKPEN [place]) Inject 6-8 Units into the skin 3 (three) times daily.   . Insulin Pen Needle (BD PEN NEEDLE NANO U/F) 32G X 4 MM MISC USE AS INSTRUCTED TO INJECT INSULIN 4 TIMES DAILY.  . Marland Kitchenoratadine (CLARITIN) 10 MG tablet Take 10 mg by mouth daily.   . midodrine (PROAMATINE) 2.5 MG tablet Take 2.5 mg by mouth 2 (two) times daily.   . nitroGLYCERIN (NITROSTAT) 0.4 MG SL tablet Place 1 tablet (0.4 mg total) under the tongue every 5 (five) minutes as needed for chest pain.  . Marland Kitchenndansetron (ZOFRAN ODT) 4 MG disintegrating tablet Take 1 tablet (4 mg total) by mouth every 8 (eight) hours as needed.  . rosuvastatin (CRESTOR) 40 MG tablet Take 1 tablet (40 mg total) by mouth at bedtime.  . traMADol (ULTRAM) 50 MG tablet Take 1 tablet (50 mg total) by mouth every 6 (six) hours  as needed.  . TRULICITY 1.5 ML/4.6TK SOPN INJECT THE CONTENTS OF ONE  PEN SUBCUTANEOUSLY WEEKLY  AS DIRECTED  . vitamin B-12 (CYANOCOBALAMIN) 100 MCG tablet Take 100 mcg by mouth daily.  Marland Kitchen zolpidem (AMBIEN) 5 MG tablet Take 5 mg by mouth at bedtime.   . [DISCONTINUED] metoprolol tartrate (LOPRESSOR) 25 MG tablet Take 1  tablet (25 mg total) by mouth in the morning, at noon, and at bedtime. Extra dose if needed for palpitations.     Allergies: Allergies  Allergen Reactions  . Amoxicillin Rash and Other (See Comments)    Tolerates Cephalosporins Has patient had a PCN reaction causing immediate rash, facial/tongue/throat swelling, SOB or lightheadedness with hypotension: Yes Has patient had a PCN reaction causing severe rash involving mucus membranes or skin necrosis: Yes Has patient had a PCN reaction that required hospitalization No Has patient had a PCN reaction occurring within the last 10 years: No If all of the above answers are "NO", then may proceed with Cephalosporin use.   . Tape Other (See Comments)    Pulls off skin, must use paper tape  . Aldactone [Spironolactone] Other (See Comments)    CKD/hypokalemia  . Arimidex [Anastrozole] Nausea Only  . Latex Itching and Other (See Comments)    (Dentist office)  . Tetracycline Rash    Social History: The patient  reports that she has never smoked. She has never used smokeless tobacco. She reports that she does not drink alcohol and does not use drugs.   Family History: The patient's family history includes Diabetes in her maternal grandmother and son; Healthy in her brother; Heart attack in her brother; Heart disease in her brother, father, and maternal grandmother; Heart failure in her father.   Review of Systems: Please see the history of present illness.   All other systems are reviewed and negative.   Physical Exam: VS:  BP (!) 118/50 (BP Location: Left Arm, Patient Position: Sitting, Cuff Size: Normal)   Pulse 67   Ht _0  (1.626 m)   Wt 131 lb 12.8 oz (59.8 kg)   LMP  (LMP Unknown)   BMI 22.62 kg/m  .  BMI Body mass index is 22.62 kg/m.  Wt Readings from Last 3 Encounters:  05/02/20 131 lb 12.8 oz (59.8 kg)  04/08/20 136 lb 12.8 oz (62.1 kg)  04/04/20 135 lb 12.8 oz (61.6 kg)    General: Pleasant. Chronically ill appearing -  she is in a wheelchair.  Cardiac: Regular rate and rhythm. Outflow murmur. No edema. Support stockings are on.  Respiratory:  Lungs are clear to auscultation bilaterally with normal work of breathing.  GI: Soft and nontender.  MS: No deformity or atrophy. Gait not tested.  Skin: Warm and dry. Color is sallow. Neuro:  Strength and sensation are intact and no gross focal deficits noted.  Psych: Alert, appropriate and with normal affect.   LABORATORY DATA:  EKG:  EKG is ordered today.  Personally reviewed by me. This demonstrates NSR - HR is 62.   Lab Results  Component Value Date   WBC 5.8 03/25/2020   HGB 10.6 (L) 03/25/2020   HCT 35.2 (L) 03/25/2020   PLT 126 (L) 03/25/2020   GLUCOSE 210 (H) 04/04/2020   CHOL 106 02/23/2020   TRIG 74 02/23/2020   HDL 33 (L) 02/23/2020   LDLDIRECT 53.0 10/08/2016   LDLCALC 58 02/23/2020   ALT 58 (H) 04/04/2020   AST 69 (H) 04/04/2020   NA 139 04/04/2020   K  4.7 04/04/2020   CL 105 04/04/2020   CREATININE 2.85 (H) 04/04/2020   BUN 38 (H) 04/04/2020   CO2 28 04/04/2020   TSH 2.47 04/04/2020   INR 1.2 02/12/2020   HGBA1C 7.1 (H) 04/04/2020   MICROALBUR 14.2 (H) 07/02/2019     BNP (last 3 results) No results for input(s): BNP in the last 8760 hours.  ProBNP (last 3 results) No results for input(s): PROBNP in the last 8760 hours.   Other Studies Reviewed Today:  ECHO IMPRESSIONS 04/2020  1. Left ventricular ejection fraction, by estimation, is 55 to 60%. The  left ventricle has normal function. The left ventricle has no regional  wall motion abnormalities. There is mild concentric left ventricular  hypertrophy. Left ventricular diastolic  parameters are consistent with Grade I diastolic dysfunction (impaired  relaxation). Elevated left atrial pressure.  2. Right ventricular systolic function is normal. The right ventricular  size is normal. Tricuspid regurgitation signal is inadequate for assessing  PA pressure.  3. Left  atrial size was mild to moderately dilated.  4. The mitral valve is normal in structure. Trivial mitral valve  regurgitation. No evidence of mitral stenosis. Moderate mitral annular  calcification.  5. The aortic valve has been repaired/replaced. Aortic valve  regurgitation is not visualized. No aortic stenosis is present. There is a  21 mm Edwards bioprosthetic valve present in the aortic position.  Procedure Date: 2009. Aortic valve mean gradient  measures 8.3 mmHg. Aortic valve Vmax measures 2.02 m/s.  6. The inferior vena cava is normal in size with greater than 50%  respiratory variability, suggesting right atrial pressure of 3 mmHg.   Myoview Study Highlights 04/2020    The left ventricular ejection fraction is normal (55-65%).  Nuclear stress EF: 61%.  There was no ST segment deviation noted during stress.  The study is normal.  This is a low risk study.   Normal stress nuclear study with no ischemia or infarction.  Gated ejection fraction 61% with normal wall motion.   CT CHEST IMPRESSION 04/2019:  Moderate right pleural effusion, increased from prior CT. Associated  right lower lobe atelectasis.  No evidence of pneumonia.  1.2 x 2.4 cm lesion along the inferior aspect of the right  breast/chest wall. While additional findings noted above correlate  with areas of fat necrosis, this lesion was not clearly identified  when correlating with prior breast tomography report. Breast imaging  consultation is suggested for further evaluation, including  potential ultrasound with biopsy versus breast MR. These results  will be called to the ordering clinician or representative by the  Radiologist Assistant, and communication documented in the PACS or  zVision Dashboard.  Aortic Atherosclerosis (ICD10-I70.0).  Electronically Signed  By: Julian Hy M.D.  On: 05/06/2019 18:11    CT ABDOMEN IMPRESSION 03/2019: Chest Impression:  1. Mild RIGHT basilar  atelectasis and small RIGHT effusion.  2. Increased skin thickening in the lateral RIGHT breast. Recommend  clinical correlation.  3. Stable seroma in the RIGHT breast.   Abdomen / Pelvis Impression:  1. No acute findings in the abdomen pelvis.  2. Nonobstructing calculi in the LEFT renal pelvis.  3. Degenerative endplate change in the lumbar spine similar to  comparison CT 2019.  4. Coronary artery calcification and Aortic Atherosclerosis  (ICD10-I70.0).  Electronically Signed  By: Suzy Bouchard M.D.  On: 03/17/2019 09:59   ABD [place] 02/17/2019  Other findings: There is a somewhat lobulated fluid collection  identified along the inferior aspect of the  liver. This measures 2.6  x 3.4 x 4.3 cm. This was not seen on the prior CT examination and  may be related to a focal fluid collection from prior  cholecystectomy. Mild perihepatic ascites is noted.  Note is made of right-sided pleural effusion.  IMPRESSION:  Status post cholecystectomy. There is a lobulated cystic area as  described above along the inferior right hepatic margin. This could  be related to the prior surgery possibly representing a small  seroma. CT may be helpful for further evaluation.  Small right pleural effusion.  Mild ascites.  Increased echogenicity within the liver likely related to fatty  infiltration or underlying hepatocellular disease.  Electronically Signed  By: Inez Catalina M.D.  On: 02/17/2019 10:18    Breast Ultrasound 01/2019  IMPRESSION:  1. New irregular hypoechoic mass along the posterior margin of the  scar right breast 8 o'clock position. Findings are nonspecific  however concerning for the possibility of localized recurrence.  2. Within the outer aspect of the right axilla there are two  adjacent irregular hypoechoic masses just deep to the skin which are  nonspecific. These may represent focal fat necrosis or potentially  recurrent or metastatic disease.  RECOMMENDATION:  1.  Ultrasound-guided core needle biopsy right breast mass 8 o'clock  position just deep to the scar.  2. Ultrasound-guided core needle biopsy of both irregular hypoechoic  masses within the outer aspect of the right axilla.    Cardiac Cath: 02/23/2013  Left mainstem: Normal  Left anterior descending (LAD): Severe proximal and mid calcification with long proximal 95% stenosis. The mid and distal vessel is small but free of high grade disease.  Left circumflex (LCx): AV groove has a mid 90% stenosis before a moderate sized MOM. OM1 and OM2 are occluded at the ostium and fill via the SVG. OM3 occluded at the ostium and fills via SVG. The grafted OMs are small and diffusely disease.  Right coronary artery (RCA): Occluded in the mid vessel. The PDA is moderate sized and occluded at the ostium. There is a 70% stenosis in the PDA after the insertion of the vein graft.  Grafts:  LIMA to LAD: Patent  SVG to RCA: Occluded (from original CABG)  SVG to RCA: Patent (from redo CABG). This is mild diffuse plaque within the vein graft.  SVG to OM3: Patent. This is mild diffuse plaque within the vein graft.  SVG sequential to OM1/OM2: Patent. There is ostial 50% stenosis. There is a patent proximal SVG stent. The native marginals are very small.  Left ventriculography: LV not injected. AVR not crossed.  Final Conclusions: Severe native vessel CAD. Patent grafts as described with nonobstructive disease in the grafts. A stent placed into the sequential SVG is patent with some disease at the ostium that on several views in not occlusive. I did compare the 2009 cath with results today. There is high grade disease in the circumflex AV groove. However, this is not changed from 2009. However, this lesion does appear to lead into a non grafted OM. If she has further symptoms I would consider PCI of the native circumflex.     Assessment/Plan:  1. Recurrent syncope - unclear etiology - no pauses noted on her loop -  she has had some bradycardia identified and most recently junctional rhythm but not felt to be the source of these spells - we will stop the Metoprolol. She is seeing Neuro tomorrow - ?if needs MRI head/neck - I worry about vertebral disease, seizures but  also if this is in fact a cardiac issue.   2. CAD - prior CABG 1998 and then redo in 2009 - her last cath in 2014 - had disease noted then - she has begun to have more angina - has not tolerated Imdur on repeated occasions. Now stopping her Metoprolol. I suspect her angina will worsen. She has not wanted to pursue cardiac cath due to risk for renal failure and need for HD. I think our options are very limited. Have discussed this with Dr. Burt Knack in the past - she would need staged procedure with hydration if it comes to this.   3. CKD - followed by Nephrology.   4. Anemia - her counts have now slowly improved - she never did have her colonoscopy due last year to the fall with subsequent concussion.   5. Chronic orthostatic hypotension - on Midodrine TID. Stopping Metoprolol today. This has been very challenging - she is using support stockings. No longer needing diuretics.   6. Carotid disease - this was just updated and was stable - ?if she needs transcranial doppler study??  7. PAF - remains on low dose amiodarone - we have not seen recurrence of this.   8. Breast cancer - not discussed today.   9. Chronic anticoagulation  10. Chronic diastolic HF - her weight is down - no longer really needing diuretics at this point.   I think overall, this situation is very challenging - I have explained to her and Hannah [name] that I think we are going to need to be making some hard decisions soon - that I think our options are getting limited. I had planned on transferring her over the the Advanced HF team when I leave - I would like to go ahead and get her seen by Dr. Aundra Dubin and see what his recommendations are. I think her overall situation is quite tenuous  and overall prognosis is not good.    Current medicines are reviewed with the patient today.  The patient does not have concerns regarding medicines other than what has been noted above.  The following changes have been made:  See above.  Labs/ tests ordered today include:    Orders Placed This Encounter  Procedures  . EKG 12-Lead     Disposition:   FU with me next week. See above for the plan.    Patient is agreeable to this plan and will call if any problems develop in the interim.   SignedTruitt Merle, NP  05/02/2020 10:32 AM  [facility] [address] [place], [place]  [address] Phone: [phone] Fax: [phone]

## 2020-04-27 NOTE — Telephone Encounter (Signed)
Patient called in and states she has passed out 5 mins ago. She says she is dizzy a little and she was nauseous and sweating when she got up and she sent a transmission for the nurse to look at

## 2020-04-27 NOTE — Telephone Encounter (Signed)
Have called Phillis Haggis was on the line as well. He notes some jerking like motion with this spell along with total loss of consciousness.   I have advised she go to the ER - she does not wish to do this despite still not feeling all that well during our conversation.   She is seeing Dr. Posey Pronto in the morning, me on Monday.   May need Neurology consult with MRI/EEG. She is going to touch base with PCP as well.   Overall situation is quite tenuous - I have advised that if there was any recurrence that [name] is to call EMS.   [name], RN, [facility] [address] [place], [place]  [address] [phone]

## 2020-04-27 NOTE — Telephone Encounter (Signed)
See phone note regarding this spell.   [name]

## 2020-04-27 NOTE — Telephone Encounter (Signed)
Patient reports she was reaching in her pocketbook for her phone and next thing she woke up in the floor. Per her husband, states he was standing next to patient and he was able to lower patient into floor. Patient was aware of her location and her husband. Reports when she woke she was diaphoretic, nauseated and not feeling well. Checked glucose, was 126. Denies any injury. Advised patient I do not see any episodes on manual transmission received 04/27/20.  Presenting VS 80. Advised patient to follow-up with PCP or go to the ED if she has another episode. Verbalized understanding.  Routing to Truitt Merle to make aware since she is aware of episodes.

## 2020-04-28 ENCOUNTER — Other Ambulatory Visit: Payer: Self-pay | Admitting: *Deleted

## 2020-04-28 DIAGNOSIS — R55 Syncope and collapse: Secondary | ICD-10-CM

## 2020-04-29 ENCOUNTER — Telehealth: Payer: Self-pay

## 2020-04-29 NOTE — Telephone Encounter (Signed)
She is actually on 25 mg of Lopressor just BID now - along with her Amiodarone.   Hannah [name]

## 2020-04-29 NOTE — Telephone Encounter (Signed)
[facility] alert received- Alert for AF logged 2 minutes; ECG review - cannot see clear P waves w/ rates 40's bpm; ? AF but cannot rule out junctional rhythm.   Episode occurred 04/28/20, ~8pm.    Spoke with pt, she reports at the time of episode she was sitting in her chair, she felt a little dizzy but not as bad as she had felt with pre-syncopal episodes last week.  Her chest felt "heavy".    Patient medications include Eliquis, Metoprolol 25mg  TID, with PRN dose for palpitations.

## 2020-04-29 NOTE — Telephone Encounter (Signed)
I would suggest stopping the lopressor. GT

## 2020-05-02 ENCOUNTER — Other Ambulatory Visit: Payer: Self-pay

## 2020-05-02 ENCOUNTER — Encounter: Payer: Self-pay | Admitting: Nurse Practitioner

## 2020-05-02 ENCOUNTER — Other Ambulatory Visit: Payer: Self-pay | Admitting: Nurse Practitioner

## 2020-05-02 ENCOUNTER — Ambulatory Visit (INDEPENDENT_AMBULATORY_CARE_PROVIDER_SITE_OTHER): Payer: Medicare Other | Admitting: Nurse Practitioner

## 2020-05-02 VITALS — BP 118/50 | HR 67 | Ht 64.0 in | Wt 131.8 lb

## 2020-05-02 DIAGNOSIS — R079 Chest pain, unspecified: Secondary | ICD-10-CM

## 2020-05-02 DIAGNOSIS — I2510 Atherosclerotic heart disease of native coronary artery without angina pectoris: Secondary | ICD-10-CM

## 2020-05-02 DIAGNOSIS — I5032 Chronic diastolic (congestive) heart failure: Secondary | ICD-10-CM

## 2020-05-02 DIAGNOSIS — I480 Paroxysmal atrial fibrillation: Secondary | ICD-10-CM | POA: Diagnosis not present

## 2020-05-02 DIAGNOSIS — R55 Syncope and collapse: Secondary | ICD-10-CM

## 2020-05-02 DIAGNOSIS — Z79899 Other long term (current) drug therapy: Secondary | ICD-10-CM

## 2020-05-02 NOTE — Patient Instructions (Addendum)
After Visit Summary:  We will be checking the following labs today - NONE   Medication Instructions:    Continue with your current medicines. BUT  I am stopping the Metoprolol   If you need a refill on your cardiac medications before your next appointment, please call your pharmacy.     Testing/Procedures To Be Arranged:  N/A  Follow-Up:   See Neurology tomorrow   I will see you next week as planned.     At [facility], you and your health needs are our priority.  As part of our continuing mission to provide you with exceptional heart care, we have created designated Provider Care Teams.  These Care Teams include your primary Cardiologist (physician) and Advanced Practice Providers (APPs -  Physician Assistants and Nurse Practitioners) who all work together to provide you with the care you need, when you need it.  Special Instructions:  . Stay safe, wash your hands for at least 20 seconds and wear a mask when needed.  . It was good to talk with you today.    Call the [facility] office at [phone] if you have any questions, problems or concerns.

## 2020-05-02 NOTE — Progress Notes (Signed)
Can we get her an appointment? Thanks.

## 2020-05-03 ENCOUNTER — Encounter: Payer: Self-pay | Admitting: Neurology

## 2020-05-03 ENCOUNTER — Ambulatory Visit (INDEPENDENT_AMBULATORY_CARE_PROVIDER_SITE_OTHER): Payer: Medicare Other | Admitting: Neurology

## 2020-05-03 VITALS — BP 99/59 | HR 68 | Ht 64.0 in | Wt 132.8 lb

## 2020-05-03 DIAGNOSIS — R55 Syncope and collapse: Secondary | ICD-10-CM | POA: Diagnosis not present

## 2020-05-03 NOTE — Progress Notes (Signed)
NEUROLOGY FOLLOW UP OFFICE NOTE  Hannah [name] [id number] Jul 19, 1945  HISTORY OF PRESENT ILLNESS: I had the pleasure of seeing Hannah [name] in follow-up in the neurology clinic on 05/03/2020.  The patient was last seen over 2 years ago for recurrent syncope. She is again accompanied by her husband who helps supplement the history today.  Records and images were personally reviewed where available. MRI brain with and without contrast in 2018 was unremarkable. She had a 48-hour EEG in 08/2017 which was normal. She had one episode of dizziness with BP 87/36, no EEG changes. She was lost to follow-up, they both report that the syncopal episodes quieted down for 2 years until 04/21/2020 when she had an episode at a restaurant where she started to feel bad then had an extreme headache in the back of her head that she could not hold her head up. Family noticed her voice sounded different and speech was slowed. She then threw up her food and lost consciousness for a few minutes. When she woke up, headache was gone. BP was 90/60. When she started feeling better, BP was 98/58. She then got sick again and BP went up to 138/62. Since then, she started having recurrent syncopal episodes occurring daily, sometimes multiple times a day. She has had several falls due to these episodes. She has a loop recorder with no abnormalities noted during the events. Echocardiogram showed EF 55-60%, elevated left atrial pressure, left atrial size mild to moderately dilated. Stress nuclear study was normal. She was referred back to Neurology for further evaluation. She had a head CT without contrast which was unremarkable.  She reports that most of the time, the episodes start with an intense headache up the back of her neck/head that "comes with a blast." Her legs would start getting very weak and shaky, even if she is sitting down. If she is standing, she has not control of her legs. Her husband has noticed she does not respond or  does not talk much, then when she passes out, eyes are open just looking straight. She has had some jerking movements from the waist down. She then vomits before or after she wakes up, and when she comes to, the head pain is gone. No tongue bite or incontinence, no focal weakness. She was having up to 3-5 episodes in one day. Metoprolol was stopped yesterday, they report that after having daily events the past 10 days, she has not had any spells yesterday or today. She denies any olfactory/gustatory hallucinations, focal numbness/tingling/weakness, myoclonic jerks. She has had several bad falls due to them. She had one while on the toilet, her husband heard a noise and found her laying back on the toilet. No nocturnal events. She has been dealing with hypotension and was started on Midodrine several months ago. She has easy fatigability.   History on Initial Assessment 10/23/2016: This is a very pleasant 74 yo RH woman with a history of CAD s/p CABG x 2, diabetes on disposable insulin pump, paroxysmal atrial fibrillation, s/p aortic valve replacement, hypertension, and breast cancer s/p chemotherapy and radiation, who had an episode of loss of consciousness while at her radiation appointment last 06/29/2016. She was sitting in a wheelchair when she became unresponsive, eyes were open, glassy, followed by bilateral upper extremity shaking and vomiting after. She was confused for a few minutes after and was amnestic of the event. Vital signs were stable, blood sugar was 206. She was brought to the ER where she had  another episode of unresponsiveness with no associated shaking, followed by brief confusion. While she was admitted, she had an event coming back from the bathroom when she started looking down and became unresponsive with some clonic movement of one hand for 1 minute. She had an EEG which showed mild diffuse slowing with no epileptiform discharges. I personally reviewed MRI brain with and without contrast,  there was significant movement artifact, but there were no acute changes seen, no abnormal enhancement noted. She was initially started on Vimpat, however during her stay she was found to be orthostatic with decline in SBP from 130s to 60s upon standing, there was also note of documented arrhythmias at that time. Vimpat was discontinued. She was advised follow-up with outpatient neurology to continue to monitor should symptoms continue despite treatment of orthostasis and arrhythmias. She has seen Cardiology and continues on midodrine, Sotalol, and prn Lasix. She feels the medication is helping, she reports her lowest BP at home is around 110/45 to 50. She has had only one more syncopal episode that occurred the week after hospital discharge in January . She was standing in the bedroom then her husband saw a change in her face and sat her down, she briefly passed out. She reports presyncopal episodes continue, she gets lightheaded while standing then shaky in her legs, sitting down with resolution of symptoms. Every once in a while the quiver occurs in her arms, resolving when she sits down. She had a fall 3 weeks ago and does not think she passed out, she feels it was due to her neuropathy, her legs did not move when she tried to turn and fell down. No significant injuries.  She reports a history of a "mini-stroke" 7-8 years ago that affected her left side and caused hearing loss on the right. She was told the stroke was in the back of the brain. Around that time, she also started having episodes of passing out but without any shaking. After a month or so, they stopped. She was evaluated by neurologist Dr. Justine Null and recall having an ambulatory EEG done which "did not find anything." She has noticed occasional gaps in time occurring around twice a month. About a couple of times a month, she would smell something other people don't, reports they are different smells each time. She denies any rising epigastric  sensation, myoclonic jerks. 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 She feels her strength has been low since she finished radiation mid-February. She denies any diplopia, dysarthria, neck pain, bowel/bladder dysfunction. She feels something gets stuck in her throat when swallowing. She has chronic back pain. She had a normal birth and early development.  There is no history of febrile convulsions, CNS infections such as meningitis/encephalitis, significant traumatic brain injury, neurosurgical procedures, or family history of seizures.  PAST MEDICAL HISTORY: Past Medical History:  Diagnosis Date  . Abnormally small mouth   . Allergic rhinitis 10/14/2009   Qualifier: Diagnosis of  By: Lamonte Sakai MD, Rose Fillers   Overview:  Overview:  Qualifier: Diagnosis of  By: Lamonte Sakai MD, Rose Fillers  Last Assessment & Plan:  Please continue Xyzal and Nasacort as you have been using them  . Anemia   . Asthma 05/12/2009   10/12/2014 p extensive coaching HFA effectiveness =    75% s spacer    Overview:  Overview:  10/12/2014 p extensive coaching HFA effectiveness =    75% s spacer   Last Assessment & Plan:  Please continue Symbicort 2 puffs  twice a day. Remember to rinse and gargle after taking this medication. Take albuterol 2 puffs up to every 4 hours if needed for shortness of breath.  Follow with Dr Lamonte Sakai in 6 month  . Bilateral carotid artery stenosis    a. mild 1-39% by duplex 10/2017, due 2021.  . Breast cancer ([place]) 01/18/2016   right breast  . CAD (coronary artery disease) 01/24/2011   a. s/p CABG 1998 with redo 2009.  Marland Kitchen Chemotherapy induced nausea and vomiting 02/24/2016  . 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 Chronic diastolic CHF (congestive heart failure) ([place]) 09/24/2013  . CKD (chronic kidney disease), stage IV ([place]) 09/24/2013  . Closed fracture of head of left humerus 09/09/2017  . Complication of anesthesia   . Dysrhythmia    a-fib  .  Gallstones   . GERD (gastroesophageal reflux disease)   . Glaucoma   . H/O atrial tachycardia 05/12/2009   Qualifier: History of  By: Lamonte Sakai MD, Rose Fillers   Overview:  Overview:  Qualifier: History of  By: Lamonte Sakai MD, Rose Fillers  Last Assessment & Plan:  There was no evidence of this on her cardiac monitor. I would recommend watchful waiting.   Marland Kitchen Heart murmur   . History of kidney stones   . History of non-ST elevation myocardial infarction (NSTEMI)    Sept 2014--  thought to be type II HTN w/ LHC without infarct related artery and patent grafts  . History of radiation therapy 05/24/16-07/26/16   right breast 50.4 Gy in 28 fractions, right breast boost 10 Gy in 5 fractions  . Hyperlipidemia   . Hypotension   . Iron deficiency anemia   . Mania ([place]) 03/05/2016  . Moderate persistent asthma    pulmologist-  Dr. Malvin Johns  . Osteomyelitis of toe of left foot ([place]) 04/06/2016  . Osteopenia of multiple sites 10/19/2015  . PAF (paroxysmal atrial fibrillation) ([place])   . Personal history of chemotherapy 2017  . Personal history of radiation therapy 2017  . PONV (postoperative nausea and vomiting)   . Port catheter in place 02/17/2016  . Psoriasis    right leg  . Renal calculus, right   . S/P AVR    prosthesis valve placement 2009 at same time re-do CABG  . Seizure-like activity ([place]) 09/01/2017  . Sensorineural hearing loss (SNHL) of both ears 01/06/2016  . Stroke [facility]) 2014   residual rt hearing loss  . Syncope 09/06/2017  . Type 2 diabetes mellitus ([place])    monitored by dr Dwyane Dee    MEDICATIONS: Current Outpatient Medications on File Prior to Visit  Medication Sig Dispense Refill  . ACCU-CHEK GUIDE test strip Use to test blood sugar 3 times daily 150 strip 3  . acetaminophen (TYLENOL) 500 MG tablet Take 1,000 mg by mouth every 6 (six) hours as needed for moderate pain.    Marland Kitchen albuterol (VENTOLIN HFA) 108 (90 Base) MCG/ACT inhaler Inhale 2 puffs into the lungs every 6 (six) hours as needed for wheezing  or shortness of breath. 18 g 5  . amiodarone (PACERONE) 200 MG tablet TAKE ONE TABLET BY MOUTH DAILY 90 tablet 3  . apixaban (ELIQUIS) 2.5 MG TABS tablet Take 1 tablet (2.5 mg total) by mouth 2 (two) times daily. 180 tablet 1  . Blood Glucose Monitoring Suppl (ACCU-CHEK GUIDE ME) w/Device KIT Use accu chek meter to check blood sugar three times daily. 1 kit 0  . budesonide-formoterol (SYMBICORT) 160-4.5 MCG/ACT inhaler Inhale 2  puffs into the lungs 2 (two) times daily. 1 Inhaler 5  . Cholecalciferol (DIALYVITE VITAMIN D 5000) 125 MCG (5000 UT) capsule Take 5,000 Units by mouth daily.    Marland Kitchen exemestane (AROMASIN) 25 MG tablet TAKE ONE TABLET BY MOUTH DAILY AFTER BREAKFAST 90 tablet 3  . ezetimibe (ZETIA) 10 MG tablet TAKE ONE TABLET BY MOUTH EVERY DAY 90 tablet 3  . famotidine (PEPCID) 20 MG tablet Take 20 mg by mouth 2 (two) times daily.    . furosemide (LASIX) 20 MG tablet Take 20 mg by mouth daily as needed for fluid.     Marland Kitchen insulin glargine (LANTUS) 100 UNIT/ML injection Inject 20 Units into the skin daily.     . Insulin Lispro (HUMALOG KWIKPEN [place]) Inject 6-8 Units into the skin 3 (three) times daily.     . Insulin Pen Needle (BD PEN NEEDLE NANO U/F) 32G X 4 MM MISC USE AS INSTRUCTED TO INJECT INSULIN 4 TIMES DAILY. 360 each 1  . loratadine (CLARITIN) 10 MG tablet Take 10 mg by mouth daily.     . midodrine (PROAMATINE) 2.5 MG tablet Take 2.5 mg by mouth 2 (two) times daily.     . nitroGLYCERIN (NITROSTAT) 0.4 MG SL tablet Place 1 tablet (0.4 mg total) under the tongue every 5 (five) minutes as needed for chest pain. 25 tablet 3  . ondansetron (ZOFRAN ODT) 4 MG disintegrating tablet Take 1 tablet (4 mg total) by mouth every 8 (eight) hours as needed. 10 tablet 0  . rosuvastatin (CRESTOR) 40 MG tablet Take 1 tablet (40 mg total) by mouth at bedtime. 90 tablet 3  . traMADol (ULTRAM) 50 MG tablet Take 1 tablet (50 mg total) by mouth every 6 (six) hours as needed. 30 tablet 0  . TRULICITY 1.5 WJ/1.9JY  SOPN INJECT THE CONTENTS OF ONE  PEN SUBCUTANEOUSLY WEEKLY  AS DIRECTED 6 mL 3  . vitamin B-12 (CYANOCOBALAMIN) 100 MCG tablet Take 100 mcg by mouth daily.    Marland Kitchen zolpidem (AMBIEN) 5 MG tablet Take 5 mg by mouth at bedtime.      No current facility-administered medications on file prior to visit.    ALLERGIES: Allergies  Allergen Reactions  . Amoxicillin Rash and Other (See Comments)    Tolerates Cephalosporins Has patient had a PCN reaction causing immediate rash, facial/tongue/throat swelling, SOB or lightheadedness with hypotension: Yes Has patient had a PCN reaction causing severe rash involving mucus membranes or skin necrosis: Yes Has patient had a PCN reaction that required hospitalization No Has patient had a PCN reaction occurring within the last 10 years: No If all of the above answers are "NO", then may proceed with Cephalosporin use.   . Tape Other (See Comments)    Pulls off skin, must use paper tape  . Aldactone [Spironolactone] Other (See Comments)    CKD/hypokalemia  . Arimidex [Anastrozole] Nausea Only  . Latex Itching and Other (See Comments)    (Dentist office)  . Tetracycline Rash    FAMILY HISTORY: Family History  Problem Relation Age of Onset  . Heart disease Father   . Heart failure Father   . Diabetes Maternal Grandmother   . Heart disease Maternal Grandmother   . Diabetes Son   . Healthy Brother        #1  . Heart attack Brother        #2  . Heart disease Brother        #2  . Colon cancer Neg Hx   .  Esophageal cancer Neg Hx     SOCIAL HISTORY: Social History   Socioeconomic History  . Marital status: Married    Spouse name: Not on file  . Number of children: 2  . Years of education: Not on file  . Highest education level: Not on file  Occupational History  . Occupation: retired  Tobacco Use  . Smoking status: Never Smoker  . Smokeless tobacco: Never Used  Vaping Use  . Vaping Use: Never used  Substance and Sexual Activity  .  Alcohol use: No  . Drug use: No  . Sexual activity: Yes    Birth control/protection: None  Other Topics Concern  . Not on file  Social History Narrative   Right handed    Lives at home with husband    Social Determinants of Health   Financial Resource Strain:   . Difficulty of Paying Living Expenses: Not on file  Food Insecurity:   . Worried About Charity fundraiser in the Last Year: Not on file  . Ran Out of Food in the Last Year: Not on file  Transportation Needs:   . Lack of Transportation (Medical): Not on file  . Lack of Transportation (Non-Medical): Not on file  Physical Activity:   . Days of Exercise per Week: Not on file  . Minutes of Exercise per Session: Not on file  Stress:   . Feeling of Stress : Not on file  Social Connections:   . Frequency of Communication with Friends and Family: Not on file  . Frequency of Social Gatherings with Friends and Family: Not on file  . Attends Religious Services: Not on file  . Active Member of Clubs or Organizations: Not on file  . Attends Archivist Meetings: Not on file  . Marital Status: Not on file  Intimate Partner Violence:   . Fear of Current or Ex-Partner: Not on file  . Emotionally Abused: Not on file  . Physically Abused: Not on file  . Sexually Abused: Not on file     PHYSICAL EXAM: Vitals:   05/03/20 1548  BP: (!) 99/59  Pulse: 68  SpO2: 100%   General: No acute distress Head:  Normocephalic/atraumatic Skin/Extremities: No rash, no edema Neurological Exam: alert and oriented to person, place, and time. No aphasia or dysarthria. Fund of knowledge is appropriate.  Recent and remote memory are intact.  Attention and concentration are normal.   Cranial nerves: Pupils equal, round. Extraocular movements intact with no nystagmus. Visual fields full.  No facial asymmetry.  Motor: Bulk and tone normal, muscle strength 5/5 throughout with no pronator drift.   Finger to nose testing intact.  Gait slow and  cautious, no ataxia   IMPRESSION: This is a very pleasant 74 yo RH woman with a history of CAD s/p CABG x 2, diabetes on disposable insulin pump, paroxysmal atrial fibrillation, s/p aortic valve replacement, hypertension, and breast cancer s/p chemotherapy and radiation, with recurrent syncopal episodes. MRI brain in 2018 unremarkable, 48-hour EEG in 2019 normal. She was lost to follow-up for over 2 years because they report the syncopal episodes had quieted down, until 04/21/2020 when syncopal episodes recurred, sometimes occurring up to 3-5 times a day. She has had several bad falls with these. There is some stereotyped nature to the events where she has an intense pain in the back of her head, followed by leg shakiness/weakness, slowed speech, then unresponsiveness for a few minutes. The headache is gone when she wakes up. She  has had an extensive cardiac evaluation with loop recorder showing no abnormalities during these episodes. We discussed doing inpatient video EEG monitoring to capture and classify these episodes. Curiously, after having these episodes on a daily basis for the past 10 days, she has not had any yesterday and today. Metoprolol had been stopped yesterday. Continue follow-up with Cardiology, she will follow-up after EMU admission. She does not drive.   Thank you for allowing me to participate in her care.  Please do not hesitate to call for any questions or concerns.   Ellouise Newer, M.D.   CC: Dr. Francesco Sor, Truitt Merle, NP

## 2020-05-03 NOTE — Patient Instructions (Signed)
1. We will get you set up for the inpatient video EEG study  2. Continue all your medications  3. Follow-up after EEG

## 2020-05-09 ENCOUNTER — Other Ambulatory Visit: Payer: Self-pay

## 2020-05-09 ENCOUNTER — Ambulatory Visit (INDEPENDENT_AMBULATORY_CARE_PROVIDER_SITE_OTHER): Payer: Medicare Other | Admitting: Nurse Practitioner

## 2020-05-09 ENCOUNTER — Encounter: Payer: Self-pay | Admitting: Nurse Practitioner

## 2020-05-09 VITALS — BP 124/56 | HR 92 | Ht 64.0 in | Wt 135.0 lb

## 2020-05-09 DIAGNOSIS — I2510 Atherosclerotic heart disease of native coronary artery without angina pectoris: Secondary | ICD-10-CM

## 2020-05-09 DIAGNOSIS — I5032 Chronic diastolic (congestive) heart failure: Secondary | ICD-10-CM | POA: Diagnosis not present

## 2020-05-09 DIAGNOSIS — Z79899 Other long term (current) drug therapy: Secondary | ICD-10-CM | POA: Diagnosis not present

## 2020-05-09 DIAGNOSIS — R55 Syncope and collapse: Secondary | ICD-10-CM

## 2020-05-09 NOTE — Patient Instructions (Addendum)
After Visit Summary:  We will be checking the following labs today - BMET, CBC, LFTs   Medication Instructions:    Continue with your current medicines.    If you need a refill on your cardiac medications before your next appointment, please call your pharmacy.     Testing/Procedures To Be Arranged:  N/A  Follow-Up:   See me the week of Christmas - December 22nd at 2:15pm    At [facility], you and your health needs are our priority.  As part of our continuing mission to provide you with exceptional heart care, we have created designated Provider Care Teams.  These Care Teams include your primary Cardiologist (physician) and Advanced Practice Providers (APPs -  Physician Assistants and Nurse Practitioners) who all work together to provide you with the care you need, when you need it.  Special Instructions:  . Stay safe, wash your hands for at least 20 seconds and wear a mask when needed.  . It was good to talk with you today.    Call the [place] office at [phone] if you have any questions, problems or concerns.

## 2020-05-10 ENCOUNTER — Encounter: Payer: Self-pay | Admitting: Adult Health

## 2020-05-10 LAB — CBC
Hematocrit: 27 % — ABNORMAL LOW (ref 34.0–46.6)
Hemoglobin: 8.6 g/dL — ABNORMAL LOW (ref 11.1–15.9)
MCH: 31.5 pg (ref 26.6–33.0)
MCHC: 31.9 g/dL (ref 31.5–35.7)
MCV: 99 fL — ABNORMAL HIGH (ref 79–97)
Platelets: 117 10*3/uL — ABNORMAL LOW (ref 150–450)
RBC: 2.73 x10E6/uL — CL (ref 3.77–5.28)
RDW: 11.9 % (ref 11.7–15.4)
WBC: 6.1 10*3/uL (ref 3.4–10.8)

## 2020-05-10 LAB — HEPATIC FUNCTION PANEL
ALT: 51 IU/L — ABNORMAL HIGH (ref 0–32)
AST: 49 IU/L — ABNORMAL HIGH (ref 0–40)
Albumin: 3.2 g/dL — ABNORMAL LOW (ref 3.7–4.7)
Alkaline Phosphatase: 81 IU/L (ref 44–121)
Bilirubin Total: 0.4 mg/dL (ref 0.0–1.2)
Bilirubin, Direct: 0.17 mg/dL (ref 0.00–0.40)
Total Protein: 5.9 g/dL — ABNORMAL LOW (ref 6.0–8.5)

## 2020-05-10 LAB — BASIC METABOLIC PANEL
BUN/Creatinine Ratio: 11 — ABNORMAL LOW (ref 12–28)
BUN: 27 mg/dL (ref 8–27)
CO2: 23 mmol/L (ref 20–29)
Calcium: 8.8 mg/dL (ref 8.7–10.3)
Chloride: 104 mmol/L (ref 96–106)
Creatinine, Ser: 2.36 mg/dL — ABNORMAL HIGH (ref 0.57–1.00)
GFR calc Af Amer: 23 mL/min/{1.73_m2} — ABNORMAL LOW (ref >59)
GFR calc non Af Amer: 20 mL/min/{1.73_m2} — ABNORMAL LOW (ref >59)
Glucose: 138 mg/dL — ABNORMAL HIGH (ref 65–99)
Potassium: 4.1 mmol/L (ref 3.5–5.2)
Sodium: 139 mmol/L (ref 134–144)

## 2020-05-11 ENCOUNTER — Other Ambulatory Visit: Payer: Self-pay

## 2020-05-11 ENCOUNTER — Telehealth: Payer: Self-pay

## 2020-05-11 DIAGNOSIS — D509 Iron deficiency anemia, unspecified: Secondary | ICD-10-CM

## 2020-05-11 NOTE — Telephone Encounter (Signed)
RN spoke with patient regarding concerns with recent labs that were drawn at PCP.   Pt reports recently labs at different clinic showing Hgb of 8.6.  Pt reports feeling weaker than her normal. Denies any shortness of breath or dizziness.   Pt with history anemia, and receives Aranesp however did not have at last time due to WNL of Hgb.   RN offered patient appointment with NP for today or tomorrow.  Pt reports she would like to see MD on Friday due to availability.  Patient scheduled for labs/MD/Injection -  Pt educated to call clinic with signification changes.

## 2020-05-12 NOTE — Progress Notes (Signed)
Patient Care Team: [name], DO as PCP - General [name], NP as PCP - Cardiology (Nurse Practitioner) Evans Lance, MD as PCP - Electrophysiology (Cardiology) Alphonsa Overall, MD as Consulting Physician (General Surgery) Nicholas Lose, MD as Consulting Physician (Hematology and Oncology) Gery Pray, MD as Consulting Physician (Radiation Oncology) Evans Lance, MD as Consulting Physician (Cardiology) Collene Gobble, MD as Consulting Physician (Pulmonary Disease) Elayne Snare, MD as Consulting Physician (Endocrinology) Delice Bison, Charlestine Massed, NP as Nurse Practitioner (Hematology and Oncology) Ardelle Balls., MD (Neurology) Izora Gala, MD as Consulting Physician (Otolaryngology) Trula Slade, DPM as Consulting Physician (Podiatry) Cameron Sprang, MD as Consulting Physician (Neurology)  DIAGNOSIS:    ICD-10-CM   1. Malignant neoplasm of upper-inner quadrant of right breast in female, estrogen receptor positive ([place])  C50.211    Z17.0     SUMMARY OF ONCOLOGIC HISTORY: Oncology History  Breast cancer of upper-inner quadrant of right female breast ([place])  12/14/2015 Initial Diagnosis   Right breast biopsy 12:30 position: 2 masses, 1.6 cm mass: Invasive ductal carcinoma, grade 2, ER 0%, PR 0%, HER-2 negative, Ki-67 70%; satellite mass 8 mm: IDC grade 2 ER 5%, PR 5%, HER-2 negative, Ki-67 50%; T1cN0 stage IA clinical stage   01/18/2016 Surgery   Right lumpectomy: Multifocal IDC grade 3, 1.9 cm  ER 0%, PR 0%, HER-2 negative, Ki-67 70% and 0.8 cm satellite mass ER 5%, PR 2%, HER-2 negative, Ki-67 50%, high-grade DCIS, margins negative, 0/1 lymph nodes negative T1 cN0 stage IA   02/17/2016 - 04/06/2016 Chemotherapy   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 with above-mentioned history of breast cancer and severe anemia. Labs on 05/09/20 showed Hg 8.6, HCT 27.0, platelets 117. She presents to the clinic today for follow-up.  She reports to be feeling significantly tired and weak and short of breath to minimal exertion.  She is using a wheelchair for ambulation.  ALLERGIES:  is allergic to amoxicillin, tape, aldactone [spironolactone], arimidex [anastrozole], latex, and tetracycline.  MEDICATIONS:  Current Outpatient Medications  Medication Sig Dispense Refill  . ACCU-CHEK GUIDE test strip Use to test blood sugar 3 times daily 150 strip 3  . acetaminophen (TYLENOL) 500 MG tablet Take 1,000 mg by mouth every 6 (six) hours as needed for moderate pain.    Marland Kitchen albuterol (VENTOLIN HFA) 108 (90 Base) MCG/ACT inhaler Inhale 2 puffs into the lungs every 6 (six) hours as needed for wheezing or shortness of breath. 18 g 5  . amiodarone (PACERONE) 200 MG tablet TAKE ONE TABLET BY MOUTH DAILY 90 tablet 3  . apixaban (ELIQUIS) 2.5 MG TABS tablet Take 1 tablet (2.5 mg total) by mouth 2 (two) times daily. 180 tablet 1  . Blood Glucose Monitoring Suppl (ACCU-CHEK GUIDE ME) w/Device KIT Use accu chek meter to check blood sugar three times daily. 1 kit 0  . budesonide-formoterol (SYMBICORT) 160-4.5 MCG/ACT  inhaler Inhale 2 puffs into the lungs 2 (two) times daily. 1 Inhaler 5  . Cholecalciferol (DIALYVITE VITAMIN D 5000) 125 MCG (5000 UT) capsule Take 5,000 Units by mouth daily.    Marland Kitchen exemestane (AROMASIN) 25 MG tablet TAKE ONE TABLET BY MOUTH DAILY AFTER BREAKFAST 90 tablet 3  . ezetimibe (ZETIA)  10 MG tablet TAKE ONE TABLET BY MOUTH EVERY DAY 90 tablet 3  . famotidine (PEPCID) 20 MG tablet Take 20 mg by mouth 2 (two) times daily.    . furosemide (LASIX) 20 MG tablet Take 20 mg by mouth daily as needed for fluid.     Marland Kitchen insulin glargine (LANTUS) 100 UNIT/ML injection Inject 20 Units into the skin daily.     . Insulin Lispro (HUMALOG KWIKPEN [place]) Inject 6-8 Units into the skin 3 (three) times daily.     . Insulin Pen Needle (BD PEN NEEDLE NANO U/F) 32G X 4 MM MISC USE AS INSTRUCTED TO INJECT INSULIN 4 TIMES DAILY. 360 each 1  . loratadine (CLARITIN) 10 MG tablet Take 10 mg by mouth daily.     . midodrine (PROAMATINE) 2.5 MG tablet Take 2.5 mg by mouth 2 (two) times daily.     . nitroGLYCERIN (NITROSTAT) 0.4 MG SL tablet Place 1 tablet (0.4 mg total) under the tongue every 5 (five) minutes as needed for chest pain. 25 tablet 3  . ondansetron (ZOFRAN ODT) 4 MG disintegrating tablet Take 1 tablet (4 mg total) by mouth every 8 (eight) hours as needed. 10 tablet 0  . rosuvastatin (CRESTOR) 40 MG tablet Take 1 tablet (40 mg total) by mouth at bedtime. 90 tablet 3  . traMADol (ULTRAM) 50 MG tablet Take 1 tablet (50 mg total) by mouth every 6 (six) hours as needed. 30 tablet 0  . TRULICITY 1.5 YK/5.9DJ SOPN INJECT THE CONTENTS OF ONE  PEN SUBCUTANEOUSLY WEEKLY  AS DIRECTED 6 mL 3  . vitamin B-12 (CYANOCOBALAMIN) 100 MCG tablet Take 100 mcg by mouth daily.    Marland Kitchen zolpidem (AMBIEN) 5 MG tablet Take 5 mg by mouth at bedtime.      No current facility-administered medications for this visit.   Facility-Administered Medications Ordered in Other Visits  Medication Dose Route Frequency Provider Last Rate Last Admin  . Darbepoetin Alfa (ARANESP) injection 500 mcg  500 mcg Subcutaneous Once Gardenia Phlegm, NP        PHYSICAL EXAMINATION: ECOG PERFORMANCE STATUS: 1 - Symptomatic but completely ambulatory  Vitals:   05/13/20 1203  BP: (!) 118/52  Pulse: 83  Resp: 17  Temp: 98.3 F (36.8 C)    SpO2: 100%   Filed Weights   05/13/20 1203  Weight: 132 lb 11.2 oz (60.2 kg)     LABORATORY DATA:  I have reviewed the data as listed CMP Latest Ref Rng & Units 05/13/2020 05/09/2020 04/04/2020  Glucose 70 - 99 mg/dL 103(H) 138(H) 210(H)  BUN 8 - 23 mg/dL 30(H) 27 38(H)  Creatinine 0.44 - 1.00 mg/dL 2.89(H) 2.36(H) 2.85(H)  Sodium 135 - 145 mmol/L 143 139 139  Potassium 3.5 - 5.1 mmol/L 4.3 4.1 4.7  Chloride 98 - 111 mmol/L 111 104 105  CO2 22 - 32 mmol/L _0 Calcium 8.9 - 10.3 mg/dL 9.4 8.8 8.9  Total Protein 6.5 - 8.1 g/dL 6.4(L) 5.9(L) 6.3  Total Bilirubin 0.3 - 1.2 mg/dL 0.5 0.4 0.5  Alkaline Phos 38 - 126 U/L 78 81 76  AST 15 - 41 U/L 34 49(H) 69(H)  ALT 0 - 44 U/L 39 51(H) 58(H)    Lab Results  Component Value Date   WBC 6.4 05/13/2020   HGB 8.9 (L) 05/13/2020   HCT 28.5 (L) 05/13/2020   MCV 101.4 (H) 05/13/2020   PLT 119 (L) 05/13/2020   NEUTROABS 4.8 05/13/2020    ASSESSMENT & PLAN:  Breast cancer of upper-inner quadrant of right female breast ([facility]) Right breast cancer: IDC stage IA, ER positive, PR negative, HER-2 negative, s/p Taxotere/Cytoxan x 3 cycles, adjuvant radiation and Exemestane daily since 08/2016. Exemestane toxicities: Tolerating it well.  02/24/2020: Mammogram and ultrasound: 2.9 cm right breast mass at 10 o'clock position indeterminate mass could be fat necrosis or oil cyst.  We discussed the pros and cons of doing a biopsy.  We finally decided that we will recheck her mammogram and ultrasound in 6 months.  Anemia of chronic disease Patient has had longstanding anemia where the hemoglobin was between 10 to 12 g. Since April 2020 her hemoglobin has gotten worse During the same timeframe her creatinine got from 1.5-2.44   Current treatment:Retacritinjectionsweeklystarted7/31/2020; changed to Aranesp 563m q3 weeks 11/12/2019 Bone marrow biopsy 04/08/2019: Normocellular bone marrow with trilineage hematopoiesis, absent iron stores,  flow cytometry no abnormal B or T-cell clonal abnormalities noted.   IV iron therapy:Venofer: November 2020, July 2021 Retacrit: Last injection 02/26/2020 Lab review: 01/22/2020: Hemoglobin 10.3 (previously transfuse for hemoglobin of 7.5) 02/04/2020:Hemoglobin 11.4 (did not receive Aranesp injection) 03/14/2020: Hemoglobin 10.5, platelets 108 (did not receive Aranesp injection) 03/25/20: Hemoglobin 10.6 (did not receive Aranesp) 05/13/2020: Hemoglobin 8.9: Receiving Aranesp today  We will see her in  4 weeks with labs and injection    No orders of the defined types were placed in this encounter.  The patient has a good understanding of the overall plan. she agrees with it. she will call with any problems that may develop before the next visit here.  Total time spent: 20 mins including face to face time and time spent for planning, charting and coordination of care  GNicholas Lose MD 05/13/2020  I, MCloyde ReamsDorshimer, am acting as scribe for Dr. VNicholas Lose  I have reviewed the above documentation for accuracy and completeness, and I agree with the above.

## 2020-05-12 NOTE — Assessment & Plan Note (Signed)
Right breast cancer: IDC stage IA, ER positive, PR negative, HER-2 negative, s/p Taxotere/Cytoxan x 3 cycles, adjuvant radiation and Exemestane daily since 08/2016. Exemestane toxicities: Tolerating it well.  02/24/2020: Mammogram and ultrasound: 2.9 cm right breast mass at 10 o'clock position indeterminate mass could be fat necrosis or oil cyst.  We discussed the pros and cons of doing a biopsy.  We finally decided that we will recheck her mammogram and ultrasound in 6 months.  Anemia of chronic disease Patient has had longstanding anemia where the hemoglobin was between 10 to 12 g. Since April 2020 her hemoglobin has gotten worse During the same timeframe her creatinine got from 1.5-2.44   Current treatment:Retacritinjectionsweeklystarted7/31/2020; changed to Aranesp 500mg q3 weeks 11/12/2019 Bone marrow biopsy 04/08/2019: Normocellular bone marrow with trilineage hematopoiesis, absent iron stores, flow cytometry no abnormal B or T-cell clonal abnormalities noted.   IV iron therapy:Venofer: November 2020, July 2021 Retacrit: Last injection 02/26/2020 Lab review: 01/22/2020: Hemoglobin 10.3 (previously transfuse for hemoglobin of 7.5) 02/04/2020:Hemoglobin 11.4 (did not receive Aranesp injection) 03/14/2020: Hemoglobin 10.5, platelets 108 (did not receive Aranesp injection) 03/25/20: Hemoglobin 10.6 (did not receive Aranesp)   We will see her in 2 months with labs and injection 

## 2020-05-13 ENCOUNTER — Other Ambulatory Visit: Payer: Self-pay

## 2020-05-13 ENCOUNTER — Inpatient Hospital Stay: Payer: Medicare Other

## 2020-05-13 ENCOUNTER — Telehealth: Payer: Self-pay | Admitting: *Deleted

## 2020-05-13 ENCOUNTER — Inpatient Hospital Stay: Payer: Medicare Other | Attending: Adult Health | Admitting: Hematology and Oncology

## 2020-05-13 DIAGNOSIS — Z9221 Personal history of antineoplastic chemotherapy: Secondary | ICD-10-CM | POA: Insufficient documentation

## 2020-05-13 DIAGNOSIS — N189 Chronic kidney disease, unspecified: Secondary | ICD-10-CM | POA: Insufficient documentation

## 2020-05-13 DIAGNOSIS — Z794 Long term (current) use of insulin: Secondary | ICD-10-CM | POA: Diagnosis not present

## 2020-05-13 DIAGNOSIS — D509 Iron deficiency anemia, unspecified: Secondary | ICD-10-CM

## 2020-05-13 DIAGNOSIS — Z79899 Other long term (current) drug therapy: Secondary | ICD-10-CM | POA: Insufficient documentation

## 2020-05-13 DIAGNOSIS — C50211 Malignant neoplasm of upper-inner quadrant of right female breast: Secondary | ICD-10-CM

## 2020-05-13 DIAGNOSIS — Z170 Estrogen receptor positive status [ER+]: Secondary | ICD-10-CM

## 2020-05-13 DIAGNOSIS — Z79811 Long term (current) use of aromatase inhibitors: Secondary | ICD-10-CM | POA: Insufficient documentation

## 2020-05-13 DIAGNOSIS — D638 Anemia in other chronic diseases classified elsewhere: Secondary | ICD-10-CM

## 2020-05-13 DIAGNOSIS — D631 Anemia in chronic kidney disease: Secondary | ICD-10-CM | POA: Insufficient documentation

## 2020-05-13 DIAGNOSIS — I129 Hypertensive chronic kidney disease with stage 1 through stage 4 chronic kidney disease, or unspecified chronic kidney disease: Secondary | ICD-10-CM | POA: Insufficient documentation

## 2020-05-13 DIAGNOSIS — Z923 Personal history of irradiation: Secondary | ICD-10-CM | POA: Insufficient documentation

## 2020-05-13 LAB — CBC WITH DIFFERENTIAL (CANCER CENTER ONLY)
Abs Immature Granulocytes: 0.01 10*3/uL (ref 0.00–0.07)
Basophils Absolute: 0.1 10*3/uL (ref 0.0–0.1)
Basophils Relative: 1 %
Eosinophils Absolute: 0.3 10*3/uL (ref 0.0–0.5)
Eosinophils Relative: 4 %
HCT: 28.5 % — ABNORMAL LOW (ref 36.0–46.0)
Hemoglobin: 8.9 g/dL — ABNORMAL LOW (ref 12.0–15.0)
Immature Granulocytes: 0 %
Lymphocytes Relative: 12 %
Lymphs Abs: 0.8 10*3/uL (ref 0.7–4.0)
MCH: 31.7 pg (ref 26.0–34.0)
MCHC: 31.2 g/dL (ref 30.0–36.0)
MCV: 101.4 fL — ABNORMAL HIGH (ref 80.0–100.0)
Monocytes Absolute: 0.5 10*3/uL (ref 0.1–1.0)
Monocytes Relative: 8 %
Neutro Abs: 4.8 10*3/uL (ref 1.7–7.7)
Neutrophils Relative %: 75 %
Platelet Count: 119 10*3/uL — ABNORMAL LOW (ref 150–400)
RBC: 2.81 MIL/uL — ABNORMAL LOW (ref 3.87–5.11)
RDW: 13.2 % (ref 11.5–15.5)
WBC Count: 6.4 10*3/uL (ref 4.0–10.5)
nRBC: 0 % (ref 0.0–0.2)

## 2020-05-13 LAB — RETIC PANEL
Immature Retic Fract: 13.7 % (ref 2.3–15.9)
RBC.: 2.81 MIL/uL — ABNORMAL LOW (ref 3.87–5.11)
Retic Count, Absolute: 40.2 10*3/uL (ref 19.0–186.0)
Retic Ct Pct: 1.4 % (ref 0.4–3.1)
Reticulocyte Hemoglobin: 34.5 pg (ref >27.9)

## 2020-05-13 LAB — CMP (CANCER CENTER ONLY)
ALT: 39 U/L (ref 0–44)
AST: 34 U/L (ref 15–41)
Albumin: 2.8 g/dL — ABNORMAL LOW (ref 3.5–5.0)
Alkaline Phosphatase: 78 U/L (ref 38–126)
Anion gap: 5 (ref 5–15)
BUN: 30 mg/dL — ABNORMAL HIGH (ref 8–23)
CO2: 27 mmol/L (ref 22–32)
Calcium: 9.4 mg/dL (ref 8.9–10.3)
Chloride: 111 mmol/L (ref 98–111)
Creatinine: 2.89 mg/dL — ABNORMAL HIGH (ref 0.44–1.00)
GFR, Estimated: 17 mL/min — ABNORMAL LOW (ref >60)
Glucose, Bld: 103 mg/dL — ABNORMAL HIGH (ref 70–99)
Potassium: 4.3 mmol/L (ref 3.5–5.1)
Sodium: 143 mmol/L (ref 135–145)
Total Bilirubin: 0.5 mg/dL (ref 0.3–1.2)
Total Protein: 6.4 g/dL — ABNORMAL LOW (ref 6.5–8.1)

## 2020-05-13 LAB — SAMPLE TO BLOOD BANK

## 2020-05-13 MED ORDER — DARBEPOETIN ALFA 500 MCG/ML IJ SOSY
PREFILLED_SYRINGE | INTRAMUSCULAR | Status: AC
  Filled 2020-05-13: qty 1

## 2020-05-13 MED ORDER — DARBEPOETIN ALFA 500 MCG/ML IJ SOSY
500.0000 ug | PREFILLED_SYRINGE | Freq: Once | INTRAMUSCULAR | Status: AC
  Administered 2020-05-13: 500 ug via SUBCUTANEOUS

## 2020-05-13 NOTE — Patient Instructions (Signed)

## 2020-05-13 NOTE — Telephone Encounter (Signed)
Per Dr Lindi Adie review of CBC - pt does not need transfusion. Called and informed lab.

## 2020-05-15 LAB — CUP PACEART REMOTE DEVICE CHECK
Date Time Interrogation Session: 20211204225859
Implantable Pulse Generator Implant Date: 20190322

## 2020-05-18 NOTE — Progress Notes (Deleted)
CARDIOLOGY OFFICE NOTE  Date:  05/18/2020    Hannah [name] Date of Birth: January 07, 1946 Medical Record #[id number]  PCP:  [name], DO  Cardiologist:  Servando Snare & ***    No chief complaint on file.   History of Present Illness: Hannah [name] is a 74 y.o. female who presents today for a ***   presents today for a one week check. Seen for Dr. Lovena Le. Primarily follows with me. She is going to transition back to Dr. Aundra Dubin in the [place] clinic.    She has a very complex/extensive history - this includes a history of CAD s/p CABG (1998 with redo 2009 - Dr. Cyndia Bent), bioprosthetic AVR in 7782, chronic diastolic CHF, asthma, carotid artery disease (1-39% by duplex 10/2017, due 2021), CKD stage IV (Dr. Posey Pronto), PAF - has been on Multaq in the past and now on amiodarone, atrial tachycardia, breast CA, orthostasis, chronic anemia, thrombocytopenia, mild dilation of ascending aorta by echo 10/2018, recurrent syncope/orthostasis, prior recurrent right pleural effusion requiring thoracentesis 09/2018 and 05/2019 with subsequent Pleurex/talc by Dr. Cyndia Bent, HTN, GERD, & HLD.    I have followed her for many years - she has had periodic issues with volume overload, orthostasis and tachy palpitations. Progressive CKD. Last cath was in 2014 - grafts patent. Myoview in 2015 without ischemia.   She has had worsening anemia. She has ended up having multiple repeat breast biopsies that were negative following abnormal mammogram (after her breast cancer). She has had worsening orthostasis - to the point of injury - fell in our parking lot in 02/2019 with significant laceration to the head - she is back on Midodrine. Was not able to have colonoscopy due to those events. EF remains normal. She has had recurrent kidney stones that has resulted in worsening CKD. She requires periodic transfusions for her anemia.    When seen towards the end of June - trying to do more in the way of activity. Had been switched from  Retacrit to Aranesp by hematology. Feeling better with higher dose of Imdur from earlier this summer. She has not been vaccinated - half of her family has not - we had a long discussion about this.    When seen in August - had required more transfusions. Was using less Lasix. BP lower and although her chest pain seemed better with Imdur, she thought she was not tolerating so this was stopped after her visit. Unfortunately - she suffered a fall - has had to have kyphoplasty. Her kidney function continues to deteriorate. Discussed going back to Gi for more thorough evaluation given her ongoing anemia and difficulty maintaining her HGB. When seen in mid September - counts seemed to be dropping again - overall felt to be holding her own - but still pretty tenuous situation.    I saw her last week as a work in - she has had multiple spells of syncope - unclear etiology - loop has not been all that remarkable and has not correlated with her events. She has had a low risk Myoview and her echo has been updated and was ok as well. She has been referred back to Neurology.  I have arranged for her to see  Dr. Aundra Dubin as well. I have this suspicion that these spells are cardiac related and am concerned for worsening CAD - she has wanted to avoid contrast exposure due to her CKD. She does not tolerate Imdur due to dizziness. Metoprolol was stopped last week. Very tenuous  situation.    Comes in today. Here with [name]. She is in a wheelchair today. Her spells of pre syncope have improved - she did have 2 "light" spells on Thanksgiving day - one was at the table - had already eaten - (she has really cut back on her portions) - the back of her head starts hurting - she starts drawing her words - family has started getting her positioned with her head back - this seems to help minimize the intensity. Had 2nd spell with going to the car - family laid the seat back and put a pillow back behind her head - got better and recovered  pretty quickly. Has done ok since. Can't really remember any of these spells afterwards. Says has no recollection of the time span that goes by as well. [name] notes the 2 spells on Thursday were pretty mild compared to what she has had earlier this month. Had had a spell early Tuesday morning while getting up to go to the bathroom- he noted her lower body shaking at that time. Has had nothing since Thursday. It is getting hard for her to not be able to do anything independently - she is getting depressed. Tearful.   He notes that there has not been any "shaking" since her visit with neurology.  They are now worried that an inpatient EEG will be futile if she does not have any symptoms. Her chest pain seems to be at [place] but she does know that if she walks any distance - she will get a "weak" feeling in her chest. Not using Lasix. No swelling. Weight stable at home.    Comes in today. Here with   Past Medical History:  Diagnosis Date  . Abnormally small mouth   . Allergic rhinitis 10/14/2009   Qualifier: Diagnosis of  By: Lamonte Sakai MD, Rose Fillers   Overview:  Overview:  Qualifier: Diagnosis of  By: Lamonte Sakai MD, Rose Fillers  Last Assessment & Plan:  Please continue Xyzal and Nasacort as you have been using them  . Anemia   . Asthma 05/12/2009   10/12/2014 p extensive coaching HFA effectiveness =    75% s spacer    Overview:  Overview:  10/12/2014 p extensive coaching HFA effectiveness =    75% s spacer   Last Assessment & Plan:  Please continue Symbicort 2 puffs twice a day. Remember to rinse and gargle after taking this medication. Take albuterol 2 puffs up to every 4 hours if needed for shortness of breath.  Follow with Dr Lamonte Sakai in 6 month  . Bilateral carotid artery stenosis    a. mild 1-39% by duplex 10/2017, due 2021.  . Breast cancer ([place]) 01/18/2016   right breast  . CAD (coronary artery disease) 01/24/2011   a. s/p CABG 1998 with redo 2009.  Marland Kitchen Chemotherapy induced nausea and vomiting 02/24/2016  .  Chemotherapy-induced peripheral neuropathy ([place]) 04/27/2016  . 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 Chronic diastolic CHF (congestive heart failure) ([place]) 09/24/2013  . CKD (chronic kidney disease), stage IV ([place]) 09/24/2013  . Closed fracture of head of left humerus 09/09/2017  . Complication of anesthesia   . Dysrhythmia    a-fib  . Gallstones   . GERD (gastroesophageal reflux disease)   . Glaucoma   . H/O atrial tachycardia 05/12/2009   Qualifier: History of  By: Lamonte Sakai MD, Rose Fillers   Overview:  Overview:  Qualifier: History of  By: Lamonte Sakai MD, Rose Fillers  Last Assessment & Plan:  There was no evidence of this on her cardiac monitor. I would recommend watchful waiting.   Marland Kitchen Heart murmur   . History of kidney stones   . History of non-ST elevation myocardial infarction (NSTEMI)    Sept 2014--  thought to be type II HTN w/ LHC without infarct related artery and patent grafts  . History of radiation therapy 05/24/16-07/26/16   right breast 50.4 Gy in 28 fractions, right breast boost 10 Gy in 5 fractions  . Hyperlipidemia   . Hypotension   . Iron deficiency anemia   . Mania ([place]) 03/05/2016  . Moderate persistent asthma    pulmologist-  Dr. Malvin Johns  . Osteomyelitis of toe of left foot ([place]) 04/06/2016  . Osteopenia of multiple sites 10/19/2015  . PAF (paroxysmal atrial fibrillation) ([place])   . Personal history of chemotherapy 2017  . Personal history of radiation therapy 2017  . PONV (postoperative nausea and vomiting)   . Port catheter in place 02/17/2016  . Psoriasis    right leg  . Renal calculus, right   . S/P AVR    prosthesis valve placement 2009 at same time re-do CABG  . Seizure-like activity ([place]) 09/01/2017  . Sensorineural hearing loss (SNHL) of both ears 01/06/2016  . Stroke [facility]) 2014   residual rt hearing loss  . Syncope 09/06/2017  . Type 2 diabetes mellitus ([place])    monitored by dr Dwyane Dee    Past Surgical History:  Procedure Laterality Date  . AORTIC VALVE  REPLACEMENT  2009   #9mm [facility] Ease pericardial valve (done same time is CABG)  . BREAST BIOPSY Right 02/12/2019   2 lymphnodes, 1 breast  . BREAST LUMPECTOMY Right 01/18/2016  . BREAST LUMPECTOMY WITH RADIOACTIVE SEED AND SENTINEL LYMPH NODE BIOPSY Right 01/18/2016   Procedure: RIGHT BREAST LUMPECTOMY WITH RADIOACTIVE SEED AND SENTINEL LYMPH NODE BIOPSY;  Surgeon: Alphonsa Overall, MD;  Location: [place];  Service: General;  Laterality: Right;  . CARDIAC CATHETERIZATION  03/23/2008   Pre-redo CABG: L main OK, LAD (T), CFX (T), OM1 99%, RCA (T), LIMA-LAD OK, SVG-OM(?3) OK w/ little florw to OM2, SVG-RCA OK. EF NL  . CARPAL TUNNEL RELEASE    . CHEST TUBE INSERTION Right 06/26/2019   Procedure: INSERTION PLEURAL DRAINAGE CATHETER;  Surgeon: Gaye Pollack, MD;  Location: [place];  Service: Thoracic;  Laterality: Right;  . CHOLECYSTECTOMY N/A 07/07/2018   Procedure: LAPAROSCOPIC CHOLECYSTECTOMY WITH INTRAOPERATIVE CHOLANGIOGRAM ERAS PATHWAY;  Surgeon: Alphonsa Overall, MD;  Location: [facility];  Service: General;  Laterality: N/A;  . COLONOSCOPY     around 2015. Possibly with [place]   . CORONARY ARTERY BYPASS GRAFT  1998 &  re-do 2009   Had LIMA to DX/LAD, SVG to 2 marginal branches and SVG to [facility] originally; SVG to 3rd OM and PD at time of redo  . CYSTOSCOPY W/ URETERAL STENT PLACEMENT Right 12/20/2014   Procedure: CYSTOSCOPY WITH RETROGRADE PYELOGRAM/URETERAL STENT PLACEMENT;  Surgeon: Cleon Gustin, MD;  Location: [facility];  Service: Urology;  Laterality: Right;  . CYSTOSCOPY WITH RETROGRADE PYELOGRAM, URETEROSCOPY AND STENT PLACEMENT Left 08/21/2019   Procedure: CYSTOSCOPY WITH RETROGRADE PYELOGRAM, URETEROSCOPY AND STENT PLACEMENT;  Surgeon: Cleon Gustin, MD;  Location: [facility];  Service: Urology;  Laterality: Left;  1 HR  . ESOPHAGOGASTRODUODENOSCOPY     many years ago per patient   . ESOPHAGOGASTRODUODENOSCOPY ENDOSCOPY  06/17/2018  . EYE SURGERY Bilateral    cataracts   . HOLMIUM LASER APPLICATION Right  12/20/2014   Procedure:  HOLMIUM LASER LITHOTRIPSY;  Surgeon: Cleon Gustin, MD;  [facility];  Service: Urology;  Laterality: Right;  . HOLMIUM LASER APPLICATION Left 6/31/4970   Procedure: HOLMIUM LASER APPLICATION;  Surgeon: Cleon Gustin, MD;  Location: [facility];  Service: Urology;  Laterality: Left;  . IR KYPHO LUMBAR INC FX REDUCE BONE BX UNI/BIL CANNULATION INC/IMAGING  02/12/2020  . IR THORACENTESIS ASP PLEURAL SPACE W/IMG GUIDE  10/03/2018  . IR THORACENTESIS ASP PLEURAL SPACE W/IMG GUIDE  06/08/2019  . LEFT HEART CATHETERIZATION WITH CORONARY/GRAFT ANGIOGRAM N/A 02/23/2013   Procedure: LEFT HEART CATHETERIZATION WITH Beatrix Fetters;  Surgeon: Blane Ohara, MD;  Location: [facility] CATH LAB;  Service: Cardiovascular;  Laterality: N/A;  . LOOP RECORDER INSERTION N/A 08/30/2017   Procedure: LOOP RECORDER INSERTION;  Surgeon: Evans Lance, MD;  Location: [place] CV LAB;  Service: Cardiovascular;  Laterality: N/A;  . PORTACATH PLACEMENT Left 01/18/2016   Procedure: INSERTION PORT-A-CATH;  Surgeon: Alphonsa Overall, MD;  Location: [place];  Service: General;  Laterality: Left;  . portacath removal    . REMOVAL OF PLEURAL DRAINAGE CATHETER Right 07/14/2019   Procedure: REMOVAL OF PLEURAL DRAINAGE CATHETER;  Surgeon: Gaye Pollack, MD;  Location: [place];  Service: Thoracic;  Laterality: Right;  . TALC PLEURODESIS N/A 06/26/2019   Procedure: Pietro Cassis;  Surgeon: Gaye Pollack, MD;  Location: [facility];  Service: Thoracic;  Laterality: N/A;  . TONSILLECTOMY    . TRANSTHORACIC ECHOCARDIOGRAM  02-24-2013      mild LVH,  ef 50-55%/  AV bioprosthesis was present with very mild stenosis and no regurg., mean grandient 54mmHg, peak grandient 71mmHg /  mild MR/  mild LAE and RAE/  moderate TR  . TUBAL LIGATION       Medications: No outpatient medications have been marked as taking for the 06/01/20 encounter (Appointment) with  [name], NP.     Allergies: Allergies  Allergen Reactions  . Amoxicillin Rash and Other (See Comments)    Tolerates Cephalosporins Has patient had a PCN reaction causing immediate rash, facial/tongue/throat swelling, SOB or lightheadedness with hypotension: Yes Has patient had a PCN reaction causing severe rash involving mucus membranes or skin necrosis: Yes Has patient had a PCN reaction that required hospitalization No Has patient had a PCN reaction occurring within the last 10 years: No If all of the above answers are "NO", then may proceed with Cephalosporin use.   . Tape Other (See Comments)    Pulls off skin, must use paper tape  . Aldactone [Spironolactone] Other (See Comments)    CKD/hypokalemia  . Arimidex [Anastrozole] Nausea Only  . Latex Itching and Other (See Comments)    (Dentist office)  . Tetracycline Rash    Social History: The patient  reports that she has never smoked. She has never used smokeless tobacco. She reports that she does not drink alcohol and does not use drugs.   Family History: The patient's ***family history includes Diabetes in her maternal grandmother and son; Healthy in her brother; Heart attack in her brother; Heart disease in her brother, father, and maternal grandmother; Heart failure in her father.   Review of Systems: Please see the history of present illness.   All other systems are reviewed and negative.   Physical Exam: VS:  LMP  (LMP Unknown)  .  BMI There is no height or weight on file to calculate BMI.  Wt Readings from Last 3 Encounters:  05/13/20 132 lb 11.2 oz (60.2 kg)  05/09/20 135 lb (61.2 kg)  05/03/20 132 lb 12.8 oz (60.2 kg)    General: Pleasant. Well developed, well nourished and in no acute distress.   HEENT: Normal.  Neck: Supple, no JVD, carotid bruits, or masses noted.  Cardiac: ***Regular rate and rhythm. No murmurs, rubs, or gallops. No edema.  Respiratory:  Lungs are clear to auscultation  bilaterally with normal work of breathing.  GI: Soft and nontender.  MS: No deformity or atrophy. Gait and ROM intact.  Skin: Warm and dry. Color is normal.  Neuro:  Strength and sensation are intact and no gross focal deficits noted.  Psych: Alert, appropriate and with normal affect.   LABORATORY DATA:  EKG:  EKG {ACTION; IS/IS YIR:[id number]} ordered today.  Personally reviewed by me. This demonstrates ***.  Lab Results  Component Value Date   WBC 6.4 05/13/2020   HGB 8.9 (L) 05/13/2020   HCT 28.5 (L) 05/13/2020   PLT 119 (L) 05/13/2020   GLUCOSE 103 (H) 05/13/2020   CHOL 106 02/23/2020   TRIG 74 02/23/2020   HDL 33 (L) 02/23/2020   LDLDIRECT 53.0 10/08/2016   LDLCALC 58 02/23/2020   ALT 39 05/13/2020   AST 34 05/13/2020   NA 143 05/13/2020   K 4.3 05/13/2020   CL 111 05/13/2020   CREATININE 2.89 (H) 05/13/2020   BUN 30 (H) 05/13/2020   CO2 27 05/13/2020   TSH 2.47 04/04/2020   INR 1.2 02/12/2020   HGBA1C 7.1 (H) 04/04/2020   MICROALBUR 14.2 (H) 07/02/2019     BNP (last 3 results) No results for input(s): BNP in the last 8760 hours.  ProBNP (last 3 results) No results for input(s): PROBNP in the last 8760 hours.   Other Studies Reviewed Today:   Assessment/Plan: ECHO IMPRESSIONS 04/2020   1. Left ventricular ejection fraction, by estimation, is 55 to 60%. The  left ventricle has normal function. The left ventricle has no regional  wall motion abnormalities. There is mild concentric left ventricular  hypertrophy. Left ventricular diastolic  parameters are consistent with Grade I diastolic dysfunction (impaired  relaxation). Elevated left atrial pressure.   2. Right ventricular systolic function is normal. The right ventricular  size is normal. Tricuspid regurgitation signal is inadequate for assessing  PA pressure.   3. Left atrial size was mild to moderately dilated.   4. The mitral valve is normal in structure. Trivial mitral valve  regurgitation. No  evidence of mitral stenosis. Moderate mitral annular  calcification.   5. The aortic valve has been repaired/replaced. Aortic valve  regurgitation is not visualized. No aortic stenosis is present. There is a  21 mm Edwards bioprosthetic valve present in the aortic position.  Procedure Date: 2009. Aortic valve mean gradient  measures 8.3 mmHg. Aortic valve Vmax measures 2.02 m/s.   6. The inferior vena cava is normal in size with greater than 50%  respiratory variability, suggesting right atrial pressure of 3 mmHg.   Comparison(s): No significant change from prior study.   Myoview Study Highlights 04/2020      The left ventricular ejection fraction is normal (55-65%).  Nuclear stress EF: 61%.  There was no ST segment deviation noted during stress.  The study is normal.  This is a low risk study.   Normal stress nuclear study with no ischemia or infarction.  Gated ejection fraction 61% with normal wall motion.   CT CHEST IMPRESSION 04/2019:  Moderate right pleural effusion, increased from  prior CT. Associated  right lower lobe atelectasis.  No evidence of pneumonia.  1.2 x 2.4 cm lesion along the inferior aspect of the right  breast/chest wall. While additional findings noted above correlate  with areas of fat necrosis, this lesion was not clearly identified  when correlating with prior breast tomography report. Breast imaging  consultation is suggested for further evaluation, including  potential ultrasound with biopsy versus breast MR. These results  will be called to the ordering clinician or representative by the  Radiologist Assistant, and communication documented in the PACS or  zVision Dashboard.  Aortic Atherosclerosis (ICD10-I70.0).  Electronically Signed  By: Julian Hy M.D.  On: 05/06/2019 18:11      CT ABDOMEN IMPRESSION 03/2019:  Chest Impression:  1. Mild RIGHT basilar atelectasis and small RIGHT effusion.  2. Increased skin thickening in the  lateral RIGHT breast. Recommend  clinical correlation.  3. Stable seroma in the RIGHT breast.    Abdomen / Pelvis Impression:  1. No acute findings in the abdomen pelvis.  2. Nonobstructing calculi in the LEFT renal pelvis.  3. Degenerative endplate change in the lumbar spine similar to  comparison CT 2019.  4. Coronary artery calcification and Aortic Atherosclerosis  (ICD10-I70.0).  Electronically Signed  By: Suzy Bouchard M.D.  On: 03/17/2019 09:59    ABD [place] 02/17/2019  Other findings: There is a somewhat lobulated fluid collection  identified along the inferior aspect of the liver. This measures 2.6  x 3.4 x 4.3 cm. This was not seen on the prior CT examination and  may be related to a focal fluid collection from prior  cholecystectomy. Mild perihepatic ascites is noted.  Note is made of right-sided pleural effusion.  IMPRESSION:  Status post cholecystectomy. There is a lobulated cystic area as  described above along the inferior right hepatic margin. This could  be related to the prior surgery possibly representing a small  seroma. CT may be helpful for further evaluation.  Small right pleural effusion.  Mild ascites.  Increased echogenicity within the liver likely related to fatty  infiltration or underlying hepatocellular disease.  Electronically Signed  By: Inez Catalina M.D.  On: 02/17/2019 10:18      Breast Ultrasound 01/2019    IMPRESSION:  1. New irregular hypoechoic mass along the posterior margin of the  scar right breast 8 o'clock position. Findings are nonspecific  however concerning for the possibility of localized recurrence.  2. Within the outer aspect of the right axilla there are two  adjacent irregular hypoechoic masses just deep to the skin which are  nonspecific. These may represent focal fat necrosis or potentially  recurrent or metastatic disease.  RECOMMENDATION:  1. Ultrasound-guided core needle biopsy right breast mass 8 o'clock  position just  deep to the scar.  2. Ultrasound-guided core needle biopsy of both irregular hypoechoic  masses within the outer aspect of the right axilla.      MYOVIEW FINDINGS FROM 06/2013:  Normal resting EKG. Slight ST depressions noted in aVL after  administration of lexiscan. Mild shortness of breath and nausea  resolved after the test. EKG is nondiagnostic for ischemia. TID  ratio 1.05. Lung-heart ratio 0.43. Normal ventricular chamber size.  IMPRESSION:  No evidence for ischemia. Normal wall motion. LVEF 72%.  Electronically Signed  By: Guy Sandifer      Cardiac Cath: 02/23/2013  Left mainstem: Normal  Left anterior descending (LAD): Severe proximal and mid calcification with long proximal 95% stenosis. The mid and distal vessel is  small but free of high grade disease.  Left circumflex (LCx): AV groove has a mid 90% stenosis before a moderate sized MOM. OM1 and OM2 are occluded at the ostium and fill via the SVG. OM3 occluded at the ostium and fills via SVG. The grafted OMs are small and diffusely disease.  Right coronary artery (RCA): Occluded in the mid vessel. The PDA is moderate sized and occluded at the ostium. There is a 70% stenosis in the PDA after the insertion of the vein graft.  Grafts:  LIMA to LAD: Patent  SVG to RCA: Occluded (from original CABG)  SVG to RCA: Patent (from redo CABG). This is mild diffuse plaque within the vein graft.  SVG to OM3: Patent. This is mild diffuse plaque within the vein graft.  SVG sequential to OM1/OM2: Patent. There is ostial 50% stenosis. There is a patent proximal SVG stent. The native marginals are very small.  Left ventriculography: LV not injected. AVR not crossed.  Final Conclusions: Severe native vessel CAD. Patent grafts as described with nonobstructive disease in the grafts. A stent placed into the sequential SVG is patent with some disease at the ostium that on several views in not occlusive. I did compare the 2009 cath with results today. There  is high grade disease in the circumflex AV groove. However, this is not changed from 2009. However, this lesion does appear to lead into a non grafted OM. If she has further symptoms I would consider PCI of the native circumflex.        Assessment/Plan:    1. Recurrent syncope - unclear etiology - no pauses noted on her loop - she has had some bradycardia identified and as well junctional rhythm but not felt to be the source of these spells - we have stopped her Metoprolol. She has seen neurology - will be having in patient EEG - they are worried that this will not show anything. Not sure if she needs MRI head/neck - would probably need contrast - very challenging situation.    2. CAD - prior CABG 1998 and then redo in 2009 - her last cath was in 2014 - recent low risk Myoview - I am not convinced that this is not ischemia. We have very limited ability to manage medically. Does not tolerate Imdur. Metoprolol stopped - see above.    3. CKD- followed by Nephrology - she is aware that she would be at increased risk for further deterioration of her kidney function if cardiac cath is needed. She would need to be staged for cath/PCI if needed.    4. Elevated LFTs - rechecking today - she is on amiodarone.    5. Anemia - followed by Hematology - was never able to get her colonoscopy due to prior fall with subsequent concussion.    6. Chronic orthostatic hypotension - on Midodrine TID. Stopped Metoprolol last week. This has been very challenging - she is using support stockings. No longer needing diuretics. Unfortunately, she is getting much more sedentary as a result which puts her more at risk.    7. Carotid disease - this was just updated and was stable - ?if she needs transcranial doppler study??   8. PAF - remains on low dose amiodarone - we have not seen recurrence of this. Loop in place - lab today.    9. Breast cancer - not discussed today.    10. Chronic anticoagulation   11. Chronic  diastolic HF - her weight has been staying down.  I still think this is very challenging. We are transitioning her over to CHF - seeing Dr. Aundra Dubin in about a month. I think "fresh eyes" will be helpful. I still think her overall situation is very tenuous at best. Will ask Dr. Delice Lesch to weigh in on MRI head/neck.       Current medicines are reviewed with the patient today.  The patient does not have concerns regarding medicines other than what has been noted above.  The following changes have been made:  See above.  Labs/ tests ordered today include:   No orders of the defined types were placed in this encounter.    Disposition:   FU with *** in {gen number 2-02:[id number]} {Days to years:10300}.   Patient is agreeable to this plan and will call if any problems develop in the interim.   SignedTruitt Merle, NP  05/18/2020 7:40 AM  [facility] [address] [place], [place]  [address] Phone: [phone] Fax: [phone]

## 2020-05-19 ENCOUNTER — Other Ambulatory Visit: Payer: Self-pay | Admitting: Nurse Practitioner

## 2020-05-21 ENCOUNTER — Other Ambulatory Visit: Payer: Self-pay

## 2020-05-21 ENCOUNTER — Emergency Department (HOSPITAL_COMMUNITY)
Admission: EM | Admit: 2020-05-21 | Discharge: 2020-05-21 | Disposition: A | Discharge status: Home / Self Care | Payer: Medicare Other | Attending: Emergency Medicine | Admitting: Emergency Medicine

## 2020-05-21 DIAGNOSIS — R55 Syncope and collapse: Secondary | ICD-10-CM | POA: Diagnosis present

## 2020-05-21 DIAGNOSIS — Z5321 Procedure and treatment not carried out due to patient leaving prior to being seen by health care provider: Secondary | ICD-10-CM | POA: Diagnosis not present

## 2020-05-21 DIAGNOSIS — M542 Cervicalgia: Secondary | ICD-10-CM | POA: Diagnosis not present

## 2020-05-21 LAB — CBG MONITORING, ED: Glucose-Capillary: 149 mg/dL — ABNORMAL HIGH (ref 70–99)

## 2020-05-21 NOTE — ED Triage Notes (Signed)
Pt here via EMS for eval of syncope x 4 this morning. Hx of same, preceded by sharp pain in neck. Feeling weak afterwards. Negative orthostatic changes. Recently dx with UTI and is on abx for same.

## 2020-05-23 ENCOUNTER — Ambulatory Visit (INDEPENDENT_AMBULATORY_CARE_PROVIDER_SITE_OTHER): Payer: Medicare Other

## 2020-05-23 DIAGNOSIS — R55 Syncope and collapse: Secondary | ICD-10-CM

## 2020-05-27 ENCOUNTER — Ambulatory Visit: Payer: Medicare Other

## 2020-05-27 ENCOUNTER — Other Ambulatory Visit: Payer: Medicare Other

## 2020-05-27 ENCOUNTER — Other Ambulatory Visit (HOSPITAL_COMMUNITY): Payer: Medicare Other

## 2020-05-27 ENCOUNTER — Ambulatory Visit: Payer: Medicare Other | Admitting: Hematology and Oncology

## 2020-05-27 ENCOUNTER — Telehealth: Payer: Self-pay

## 2020-05-27 NOTE — Telephone Encounter (Signed)
Transmission received. The pt states Saturday around 6 pm she had a bad syncope episode. They thought she was having a stroke and rush her to the hospital. She did not have a stroke but would like for the nurse to see if she see anything from Saturday.

## 2020-05-27 NOTE — Telephone Encounter (Signed)
Remote transmission reviewed. Patient reports she "passed Out" on 05/21/20 in the afternoon and was seen in the emergency room and a stroke was ruled out. Patient's transmission did not show any episodes at time of the syncopal event. Patient has inpatient EEG scheduled next week at [facility].

## 2020-05-27 NOTE — Telephone Encounter (Signed)
The patient states she will do the transmission as soon as she gets back from the eye doctor. I told her that would be fine.

## 2020-05-28 ENCOUNTER — Other Ambulatory Visit (HOSPITAL_COMMUNITY)
Admission: RE | Admit: 2020-05-28 | Discharge: 2020-05-28 | Disposition: A | Discharge status: Home / Self Care | Payer: Medicare Other | Source: Ambulatory Visit | Attending: Neurology | Admitting: Neurology

## 2020-05-28 DIAGNOSIS — Z01812 Encounter for preprocedural laboratory examination: Secondary | ICD-10-CM | POA: Insufficient documentation

## 2020-05-28 DIAGNOSIS — Z20822 Contact with and (suspected) exposure to covid-19: Secondary | ICD-10-CM | POA: Insufficient documentation

## 2020-05-28 LAB — SARS CORONAVIRUS 2 (TAT 6-24 HRS): SARS Coronavirus 2: NEGATIVE

## 2020-05-30 ENCOUNTER — Inpatient Hospital Stay (HOSPITAL_COMMUNITY)
Admission: RE | Admit: 2020-05-30 | Discharge: 2020-06-01 | DRG: 101 | Disposition: A | Discharge status: Home / Self Care | Payer: Medicare Other | Attending: Neurology | Admitting: Neurology

## 2020-05-30 ENCOUNTER — Inpatient Hospital Stay (HOSPITAL_COMMUNITY): Payer: Medicare Other

## 2020-05-30 DIAGNOSIS — Z87442 Personal history of urinary calculi: Secondary | ICD-10-CM | POA: Diagnosis not present

## 2020-05-30 DIAGNOSIS — I6523 Occlusion and stenosis of bilateral carotid arteries: Secondary | ICD-10-CM | POA: Diagnosis present

## 2020-05-30 DIAGNOSIS — I5032 Chronic diastolic (congestive) heart failure: Secondary | ICD-10-CM | POA: Diagnosis present

## 2020-05-30 DIAGNOSIS — I2510 Atherosclerotic heart disease of native coronary artery without angina pectoris: Secondary | ICD-10-CM | POA: Diagnosis present

## 2020-05-30 DIAGNOSIS — G620 Drug-induced polyneuropathy: Secondary | ICD-10-CM | POA: Diagnosis present

## 2020-05-30 DIAGNOSIS — E8809 Other disorders of plasma-protein metabolism, not elsewhere classified: Secondary | ICD-10-CM | POA: Diagnosis present

## 2020-05-30 DIAGNOSIS — E778 Other disorders of glycoprotein metabolism: Secondary | ICD-10-CM | POA: Diagnosis present

## 2020-05-30 DIAGNOSIS — M4802 Spinal stenosis, cervical region: Secondary | ICD-10-CM | POA: Diagnosis present

## 2020-05-30 DIAGNOSIS — Z951 Presence of aortocoronary bypass graft: Secondary | ICD-10-CM

## 2020-05-30 DIAGNOSIS — Z953 Presence of xenogenic heart valve: Secondary | ICD-10-CM | POA: Diagnosis not present

## 2020-05-30 DIAGNOSIS — N184 Chronic kidney disease, stage 4 (severe): Secondary | ICD-10-CM | POA: Diagnosis present

## 2020-05-30 DIAGNOSIS — D631 Anemia in chronic kidney disease: Secondary | ICD-10-CM | POA: Diagnosis present

## 2020-05-30 DIAGNOSIS — J4540 Moderate persistent asthma, uncomplicated: Secondary | ICD-10-CM | POA: Diagnosis present

## 2020-05-30 DIAGNOSIS — R569 Unspecified convulsions: Secondary | ICD-10-CM | POA: Diagnosis not present

## 2020-05-30 DIAGNOSIS — T451X5A Adverse effect of antineoplastic and immunosuppressive drugs, initial encounter: Secondary | ICD-10-CM | POA: Diagnosis present

## 2020-05-30 DIAGNOSIS — Z923 Personal history of irradiation: Secondary | ICD-10-CM

## 2020-05-30 DIAGNOSIS — G40909 Epilepsy, unspecified, not intractable, without status epilepticus: Secondary | ICD-10-CM | POA: Diagnosis present

## 2020-05-30 DIAGNOSIS — I130 Hypertensive heart and chronic kidney disease with heart failure and stage 1 through stage 4 chronic kidney disease, or unspecified chronic kidney disease: Secondary | ICD-10-CM | POA: Diagnosis present

## 2020-05-30 DIAGNOSIS — D696 Thrombocytopenia, unspecified: Secondary | ICD-10-CM | POA: Diagnosis present

## 2020-05-30 DIAGNOSIS — Z91040 Latex allergy status: Secondary | ICD-10-CM

## 2020-05-30 DIAGNOSIS — Z7951 Long term (current) use of inhaled steroids: Secondary | ICD-10-CM

## 2020-05-30 DIAGNOSIS — I252 Old myocardial infarction: Secondary | ICD-10-CM

## 2020-05-30 DIAGNOSIS — H903 Sensorineural hearing loss, bilateral: Secondary | ICD-10-CM | POA: Diagnosis present

## 2020-05-30 DIAGNOSIS — H409 Unspecified glaucoma: Secondary | ICD-10-CM | POA: Diagnosis present

## 2020-05-30 DIAGNOSIS — Z881 Allergy status to other antibiotic agents status: Secondary | ICD-10-CM | POA: Diagnosis not present

## 2020-05-30 DIAGNOSIS — E1143 Type 2 diabetes mellitus with diabetic autonomic (poly)neuropathy: Secondary | ICD-10-CM | POA: Diagnosis present

## 2020-05-30 DIAGNOSIS — E1122 Type 2 diabetes mellitus with diabetic chronic kidney disease: Secondary | ICD-10-CM | POA: Diagnosis present

## 2020-05-30 DIAGNOSIS — Z20822 Contact with and (suspected) exposure to covid-19: Secondary | ICD-10-CM | POA: Diagnosis present

## 2020-05-30 DIAGNOSIS — Z880 Allergy status to penicillin: Secondary | ICD-10-CM | POA: Diagnosis not present

## 2020-05-30 DIAGNOSIS — K219 Gastro-esophageal reflux disease without esophagitis: Secondary | ICD-10-CM | POA: Diagnosis present

## 2020-05-30 DIAGNOSIS — Z853 Personal history of malignant neoplasm of breast: Secondary | ICD-10-CM

## 2020-05-30 DIAGNOSIS — I471 Supraventricular tachycardia: Secondary | ICD-10-CM | POA: Diagnosis present

## 2020-05-30 DIAGNOSIS — Z8249 Family history of ischemic heart disease and other diseases of the circulatory system: Secondary | ICD-10-CM

## 2020-05-30 DIAGNOSIS — E785 Hyperlipidemia, unspecified: Secondary | ICD-10-CM | POA: Diagnosis present

## 2020-05-30 DIAGNOSIS — Z833 Family history of diabetes mellitus: Secondary | ICD-10-CM

## 2020-05-30 DIAGNOSIS — R55 Syncope and collapse: Secondary | ICD-10-CM | POA: Diagnosis present

## 2020-05-30 DIAGNOSIS — I69398 Other sequelae of cerebral infarction: Secondary | ICD-10-CM

## 2020-05-30 DIAGNOSIS — Z9221 Personal history of antineoplastic chemotherapy: Secondary | ICD-10-CM

## 2020-05-30 DIAGNOSIS — I480 Paroxysmal atrial fibrillation: Secondary | ICD-10-CM | POA: Diagnosis present

## 2020-05-30 DIAGNOSIS — Z888 Allergy status to other drugs, medicaments and biological substances status: Secondary | ICD-10-CM

## 2020-05-30 LAB — CBC WITH DIFFERENTIAL/PLATELET
Abs Immature Granulocytes: 0.01 10*3/uL (ref 0.00–0.07)
Basophils Absolute: 0 10*3/uL (ref 0.0–0.1)
Basophils Relative: 1 %
Eosinophils Absolute: 0.2 10*3/uL (ref 0.0–0.5)
Eosinophils Relative: 4 %
HCT: 31.5 % — ABNORMAL LOW (ref 36.0–46.0)
Hemoglobin: 9.2 g/dL — ABNORMAL LOW (ref 12.0–15.0)
Immature Granulocytes: 0 %
Lymphocytes Relative: 14 %
Lymphs Abs: 0.6 10*3/uL — ABNORMAL LOW (ref 0.7–4.0)
MCH: 30.6 pg (ref 26.0–34.0)
MCHC: 29.2 g/dL — ABNORMAL LOW (ref 30.0–36.0)
MCV: 104.7 fL — ABNORMAL HIGH (ref 80.0–100.0)
Monocytes Absolute: 0.6 10*3/uL (ref 0.1–1.0)
Monocytes Relative: 13 %
Neutro Abs: 2.9 10*3/uL (ref 1.7–7.7)
Neutrophils Relative %: 68 %
Platelets: 134 10*3/uL — ABNORMAL LOW (ref 150–400)
RBC: 3.01 MIL/uL — ABNORMAL LOW (ref 3.87–5.11)
RDW: 14.6 % (ref 11.5–15.5)
WBC: 4.3 10*3/uL (ref 4.0–10.5)
nRBC: 0 % (ref 0.0–0.2)

## 2020-05-30 LAB — COMPREHENSIVE METABOLIC PANEL
ALT: 46 U/L — ABNORMAL HIGH (ref 0–44)
AST: 49 U/L — ABNORMAL HIGH (ref 15–41)
Albumin: 2.6 g/dL — ABNORMAL LOW (ref 3.5–5.0)
Alkaline Phosphatase: 66 U/L (ref 38–126)
Anion gap: 9 (ref 5–15)
BUN: 24 mg/dL — ABNORMAL HIGH (ref 8–23)
CO2: 25 mmol/L (ref 22–32)
Calcium: 8.9 mg/dL (ref 8.9–10.3)
Chloride: 107 mmol/L (ref 98–111)
Creatinine, Ser: 2.18 mg/dL — ABNORMAL HIGH (ref 0.44–1.00)
GFR, Estimated: 23 mL/min — ABNORMAL LOW (ref >60)
Glucose, Bld: 145 mg/dL — ABNORMAL HIGH (ref 70–99)
Potassium: 4 mmol/L (ref 3.5–5.1)
Sodium: 141 mmol/L (ref 135–145)
Total Bilirubin: 0.5 mg/dL (ref 0.3–1.2)
Total Protein: 5.8 g/dL — ABNORMAL LOW (ref 6.5–8.1)

## 2020-05-30 LAB — MAGNESIUM: Magnesium: 1.8 mg/dL (ref 1.7–2.4)

## 2020-05-30 LAB — GLUCOSE, CAPILLARY
Glucose-Capillary: 118 mg/dL — ABNORMAL HIGH (ref 70–99)
Glucose-Capillary: 126 mg/dL — ABNORMAL HIGH (ref 70–99)
Glucose-Capillary: 92 mg/dL (ref 70–99)

## 2020-05-30 LAB — PHOSPHORUS: Phosphorus: 4.4 mg/dL (ref 2.5–4.6)

## 2020-05-30 LAB — PROTIME-INR
INR: 1.2 (ref 0.8–1.2)
Prothrombin Time: 14.6 seconds (ref 11.4–15.2)

## 2020-05-30 MED ORDER — EXEMESTANE 25 MG PO TABS
25.0000 mg | ORAL_TABLET | Freq: Every day | ORAL | Status: DC
  Administered 2020-05-31 – 2020-06-01 (×2): 25 mg via ORAL
  Filled 2020-05-30 (×2): qty 1

## 2020-05-30 MED ORDER — EZETIMIBE 10 MG PO TABS
10.0000 mg | ORAL_TABLET | Freq: Every day | ORAL | Status: DC
Start: 2020-05-30 — End: 2020-06-01
  Administered 2020-05-31 – 2020-06-01 (×2): 10 mg via ORAL
  Filled 2020-05-30 (×3): qty 1

## 2020-05-30 MED ORDER — MOMETASONE FURO-FORMOTEROL FUM 200-5 MCG/ACT IN AERO
2.0000 | INHALATION_SPRAY | Freq: Two times a day (BID) | RESPIRATORY_TRACT | Status: DC
  Administered 2020-05-30 – 2020-06-01 (×3): 2 via RESPIRATORY_TRACT
  Filled 2020-05-30: qty 8.8

## 2020-05-30 MED ORDER — ROSUVASTATIN CALCIUM 20 MG PO TABS
40.0000 mg | ORAL_TABLET | Freq: Every day | ORAL | Status: DC
  Administered 2020-05-30 – 2020-05-31 (×2): 40 mg via ORAL
  Filled 2020-05-30 (×2): qty 2

## 2020-05-30 MED ORDER — DULAGLUTIDE 1.5 MG/0.5ML ~~LOC~~ SOAJ: 1.5000 mg | SUBCUTANEOUS | Status: DC

## 2020-05-30 MED ORDER — VITAMIN B-12 100 MCG PO TABS
100.0000 ug | ORAL_TABLET | Freq: Every day | ORAL | Status: DC
  Administered 2020-05-30 – 2020-06-01 (×3): 100 ug via ORAL
  Filled 2020-05-30 (×3): qty 1

## 2020-05-30 MED ORDER — LORAZEPAM 2 MG/ML IJ SOLN: 2.0000 mg | INTRAMUSCULAR | Status: DC | PRN

## 2020-05-30 MED ORDER — INSULIN ASPART 100 UNIT/ML ~~LOC~~ SOLN
6.0000 [IU] | Freq: Three times a day (TID) | SUBCUTANEOUS | Status: DC
  Administered 2020-05-30 – 2020-06-01 (×6): 6 [IU] via SUBCUTANEOUS

## 2020-05-30 MED ORDER — LORATADINE 10 MG PO TABS
10.0000 mg | ORAL_TABLET | Freq: Every day | ORAL | Status: DC
  Administered 2020-05-30 – 2020-06-01 (×3): 10 mg via ORAL
  Filled 2020-05-30 (×3): qty 1

## 2020-05-30 MED ORDER — VITAMIN D 25 MCG (1000 UNIT) PO TABS
5000.0000 [IU] | ORAL_TABLET | Freq: Every day | ORAL | Status: DC
  Administered 2020-05-30 – 2020-06-01 (×3): 5000 [IU] via ORAL
  Filled 2020-05-30 (×6): qty 5

## 2020-05-30 MED ORDER — INSULIN ASPART 100 UNIT/ML ~~LOC~~ SOLN: 15.0000 [IU] | Freq: Three times a day (TID) | SUBCUTANEOUS | Status: DC

## 2020-05-30 MED ORDER — ACETAMINOPHEN 325 MG PO TABS: 650.0000 mg | ORAL_TABLET | ORAL | Status: DC | PRN

## 2020-05-30 MED ORDER — ALBUTEROL SULFATE HFA 108 (90 BASE) MCG/ACT IN AERS
2.0000 | INHALATION_SPRAY | Freq: Four times a day (QID) | RESPIRATORY_TRACT | Status: DC | PRN
  Filled 2020-05-30: qty 6.7

## 2020-05-30 MED ORDER — NEOMYCIN-POLYMYXIN-DEXAMETH 3.5-10000-0.1 OP OINT
1.0000 "application " | TOPICAL_OINTMENT | Freq: Three times a day (TID) | OPHTHALMIC | Status: DC
  Administered 2020-05-30 – 2020-06-01 (×7): 1 via OPHTHALMIC
  Filled 2020-05-30: qty 3.5

## 2020-05-30 MED ORDER — ENOXAPARIN SODIUM 30 MG/0.3ML ~~LOC~~ SOLN
30.0000 mg | SUBCUTANEOUS | Status: DC
  Filled 2020-05-30: qty 0.3

## 2020-05-30 MED ORDER — INSULIN ASPART 100 UNIT/ML ~~LOC~~ SOLN
0.0000 [IU] | Freq: Three times a day (TID) | SUBCUTANEOUS | Status: DC
  Administered 2020-05-30 – 2020-06-01 (×4): 2 [IU] via SUBCUTANEOUS

## 2020-05-30 MED ORDER — ACETAMINOPHEN 650 MG RE SUPP: 650.0000 mg | RECTAL | Status: DC | PRN

## 2020-05-30 MED ORDER — ZOLPIDEM TARTRATE 5 MG PO TABS
5.0000 mg | ORAL_TABLET | Freq: Every day | ORAL | Status: DC
  Administered 2020-05-30: 5 mg via ORAL
  Filled 2020-05-30 (×2): qty 1

## 2020-05-30 MED ORDER — APIXABAN 2.5 MG PO TABS
2.5000 mg | ORAL_TABLET | Freq: Two times a day (BID) | ORAL | Status: DC
  Administered 2020-05-30 – 2020-06-01 (×4): 2.5 mg via ORAL
  Filled 2020-05-30 (×4): qty 1

## 2020-05-30 MED ORDER — NITROGLYCERIN 0.4 MG SL SUBL: 0.4000 mg | SUBLINGUAL_TABLET | SUBLINGUAL | Status: DC | PRN

## 2020-05-30 MED ORDER — AMIODARONE HCL 200 MG PO TABS
200.0000 mg | ORAL_TABLET | Freq: Every day | ORAL | Status: DC
  Administered 2020-05-31 – 2020-06-01 (×2): 200 mg via ORAL
  Filled 2020-05-30 (×2): qty 1

## 2020-05-30 MED ORDER — MIDODRINE HCL 5 MG PO TABS
2.5000 mg | ORAL_TABLET | Freq: Three times a day (TID) | ORAL | Status: DC
  Administered 2020-05-30 – 2020-06-01 (×5): 2.5 mg via ORAL
  Filled 2020-05-30 (×5): qty 1

## 2020-05-30 MED ORDER — LABETALOL HCL 5 MG/ML IV SOLN
5.0000 mg | INTRAVENOUS | Status: DC | PRN
Start: 2020-05-30 — End: 2020-06-01
  Administered 2020-05-30: 5 mg via INTRAVENOUS
  Filled 2020-05-30: qty 4

## 2020-05-30 MED ORDER — INSULIN GLARGINE 100 UNIT/ML ~~LOC~~ SOLN
20.0000 [IU] | Freq: Every day | SUBCUTANEOUS | Status: DC
  Administered 2020-05-31 – 2020-06-01 (×2): 20 [IU] via SUBCUTANEOUS
  Filled 2020-05-30 (×2): qty 0.2

## 2020-05-30 NOTE — Progress Notes (Signed)
CARDIOLOGY OFFICE NOTE  Date:  06/13/2020    Hannah [name] Date of Birth: 1946/01/23 Medical Record #[id number]  PCP:  [name], DO  Cardiologist:  Servando Snare (transitioning to Dr. Aundra Dubin)   Chief Complaint  Patient presents with  . Follow-up    History of Present Illness: Hannah [name] is a 74 y.o. female who presents today for a follow up visit. Seen for Dr. Lovena Le. Primarily follows with me. She is going to transition back to Dr. Aundra Dubin in the [place] clinic.    She has a very complex/extensive history - this includes a history of CAD s/p CABG (1998 with redo 2009 - Dr. Cyndia Bent), bioprosthetic AVR in 2703, chronic diastolic CHF, asthma, carotid artery disease (1-39% by duplex 10/2017, due 2021), CKD stage IV (Dr. Posey Pronto), PAF - has been on Multaq in the past and now on amiodarone, atrial tachycardia, breast CA, orthostasis, chronic anemia, thrombocytopenia, mild dilation of ascending aorta by echo 10/2018, recurrent syncope/orthostasis, prior recurrent right pleural effusion requiring thoracentesis 09/2018 and 05/2019 with subsequent Pleurex/talc by Dr. Cyndia Bent, HTN, GERD, & HLD.    I have followed her for many years - she has had periodic issues with volume overload, orthostasis and tachy palpitations. Progressive CKD. Last cath was in 2014 - grafts patent. Myoview in 2015 without ischemia.   She has had worsening anemia. She has ended up having multiple repeat breast biopsies that were negative following abnormal mammogram (after her breast cancer). She has had worsening orthostasis - to the point of injury - fell in our parking lot in 02/2019 with significant laceration to the head - she is back on Midodrine. Was not able to have colonoscopy due to those events. EF remains normal. She has had recurrent kidney stones that has resulted in worsening CKD. She requires periodic transfusions for her anemia.    When seen towards the end of last June - trying to do more in the way of  activity. Had been switched from Retacrit to Aranesp by hematology. Feeling better with higher dose of Imdur from earlier this summer. She has not been vaccinated - half of her family has not - we had a long discussion about this.    When seen in August - had required more transfusions. Was using less Lasix. BP lower and although her chest pain seemed better with Imdur, she thought she was not tolerating so this was stopped after her visit. Unfortunately - she suffered a fall - has had to have kyphoplasty. Her kidney function continues to deteriorate. Discussed going back to Gi for more thorough evaluation given her ongoing anemia and difficulty maintaining her HGB. When seen in mid September - counts seemed to be dropping again - overall felt to be holding her own - but still pretty tenuous situation.    Seen as a early work in in December - she has had multiple spells of syncope - unclear etiology - loop has not been all that remarkable and has not correlated with her events. She has had a low risk Myoview and her echo has been updated and was ok as well. She has been referred back to Neurology.  I have arranged for her to see Dr. Aundra Dubin as well. I have this suspicion that these spells are cardiac related and am concerned for worsening CAD - she has wanted to avoid contrast exposure due to her CKD. She does not tolerate Imdur due to dizziness. Metoprolol has been stopped as well. Very tenuous situation.  She does not have good recollection of these spells - almost amnesic. She is getting very depressed due to not being able to do much. Very challenging and tenuous situation in my opinion. She has ended up having in patient stay EEG - following a significant spell - EMS was called - wait was too long at [facility] - so she was seen at [facility] - no real answers noted thru their work up as well.    She has had recent bout of bronchitis - has failed on outpatient therapy - ended up in the ER Saturday - her nebs have  been adjusted.    Comes in today. Here with [name]. She has been put back on Lasix - took 20 mg yesterday and today. Not really dizzy. BP higher at home. They note no more episodes since December 11th - when she ended up at [facility]. They note that she is better as long as BP is higher. Remains on Midodrine. She did her in patient EEG - apparently this was all ok - nothing revealing. Still with some occasional chest discomfort/throat pain. She is a little shaky with all the nebs/steroids she has had. Seeing Dr. Lindi Adie later this week.   Past Medical History:  Diagnosis Date  . Abnormally small mouth   . Allergic rhinitis 10/14/2009   Qualifier: Diagnosis of  By: Lamonte Sakai MD, Rose Fillers   Overview:  Overview:  Qualifier: Diagnosis of  By: Lamonte Sakai MD, Rose Fillers  Last Assessment & Plan:  Please continue Xyzal and Nasacort as you have been using them  . Anemia   . Asthma 05/12/2009   10/12/2014 p extensive coaching HFA effectiveness =    75% s spacer    Overview:  Overview:  10/12/2014 p extensive coaching HFA effectiveness =    75% s spacer   Last Assessment & Plan:  Please continue Symbicort 2 puffs twice a day. Remember to rinse and gargle after taking this medication. Take albuterol 2 puffs up to every 4 hours if needed for shortness of breath.  Follow with Dr Lamonte Sakai in 6 month  . Bilateral carotid artery stenosis    a. mild 1-39% by duplex 10/2017, due 2021.  . Breast cancer ([place]) 01/18/2016   right breast  . CAD (coronary artery disease) 01/24/2011   a. s/p CABG 1998 with redo 2009.  Marland Kitchen Chemotherapy induced nausea and vomiting 02/24/2016  . Chemotherapy-induced peripheral neuropathy ([place]) 04/27/2016  . 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 Chronic diastolic CHF (congestive heart failure) ([place]) 09/24/2013  . CKD (chronic kidney disease), stage IV ([place]) 09/24/2013  . Closed fracture of head of left humerus 09/09/2017  . Complication of anesthesia   . Dysrhythmia    a-fib  . Gallstones   . GERD  (gastroesophageal reflux disease)   . Glaucoma   . H/O atrial tachycardia 05/12/2009   Qualifier: History of  By: Lamonte Sakai MD, Rose Fillers   Overview:  Overview:  Qualifier: History of  By: Lamonte Sakai MD, Rose Fillers  Last Assessment & Plan:  There was no evidence of this on her cardiac monitor. I would recommend watchful waiting.   Marland Kitchen Heart murmur   . History of kidney stones   . History of non-ST elevation myocardial infarction (NSTEMI)    Sept 2014--  thought to be type II HTN w/ LHC without infarct related artery and patent grafts  . History of radiation therapy 05/24/16-07/26/16   right breast 50.4 Gy in 28 fractions, right breast boost 10 Gy in 5 fractions  .  Hyperlipidemia   . Hypotension   . Iron deficiency anemia   . Mania ([place]) 03/05/2016  . Moderate persistent asthma    pulmologist-  Dr. Malvin Johns  . Osteomyelitis of toe of left foot ([place]) 04/06/2016  . Osteopenia of multiple sites 10/19/2015  . PAF (paroxysmal atrial fibrillation) ([place])   . Personal history of chemotherapy 2017  . Personal history of radiation therapy 2017  . PONV (postoperative nausea and vomiting)   . Port catheter in place 02/17/2016  . Psoriasis    right leg  . Renal calculus, right   . S/P AVR    prosthesis valve placement 2009 at same time re-do CABG  . Seizure-like activity ([place]) 09/01/2017  . Sensorineural hearing loss (SNHL) of both ears 01/06/2016  . Stroke [facility]) 2014   residual rt hearing loss  . Syncope 09/06/2017  . Type 2 diabetes mellitus ([place])    monitored by dr Dwyane Dee    Past Surgical History:  Procedure Laterality Date  . AORTIC VALVE REPLACEMENT  2009   [facility] is [facility]  . BREAST BIOPSY Right 02/12/2019   2 lymphnodes, 1 breast  . BREAST LUMPECTOMY Right 01/18/2016  . BREAST LUMPECTOMY WITH RADIOACTIVE SEED AND SENTINEL LYMPH NODE BIOPSY Right 01/18/2016   Procedure: RIGHT BREAST LUMPECTOMY WITH RADIOACTIVE SEED AND SENTINEL LYMPH NODE BIOPSY;  Surgeon:  DAlphonsa Overall MD;  [place]  Service: General;  Laterality: Right;  . CARDIAC CATHETERIZATION  03/23/2008   Pre-redo CABG: L main OK, LAD (T), CFX (T), OM1 99%, RCA (T), LIMA-LAD OK, SVG-OM(?3) OK w/ little florw to OM2, SVG-RCA OK. EF NL  . CARPAL TUNNEL RELEASE    . CHEST TUBE INSERTION Right 06/26/2019   Procedure: INSERTION PLEURAL DRAINAGE CATHETER;  Surgeon: BGaye Pollack MD;  [place]  Service: Thoracic;  Laterality: Right;  . CHOLECYSTECTOMY N/A 07/07/2018   Procedure: LAPAROSCOPIC CHOLECYSTECTOMY WITH INTRAOPERATIVE CHOLANGIOGRAM ERAS PATHWAY;  Surgeon: NAlphonsa Overall MD;  [facility];  Service: General;  Laterality: N/A;  . COLONOSCOPY     around 2015. Possibly with [facility]   . CORONARY ARTERY BYPASS GRAFT  1998 &  re-do 2009   Had LIMA to DX/LAD, SVG to 2 marginal branches and [facility]; SVG to 3rd OM and PD at time of redo  . CYSTOSCOPY W/ URETERAL STENT PLACEMENT Right 12/20/2014   Procedure: CYSTOSCOPY WITH RETROGRADE PYELOGRAM/URETERAL STENT PLACEMENT;  Surgeon: PCleon Gustin MD;  [facility]  Service: Urology;  Laterality: Right;  . CYSTOSCOPY WITH RETROGRADE PYELOGRAM, URETEROSCOPY AND STENT PLACEMENT Left 08/21/2019   Procedure: CYSTOSCOPY WITH RETROGRADE PYELOGRAM, URETEROSCOPY AND STENT PLACEMENT;  Surgeon: MCleon Gustin MD;  [facility];  Service: Urology;  Laterality: Left;  1 HR  . ESOPHAGOGASTRODUODENOSCOPY     many years ago per patient   . ESOPHAGOGASTRODUODENOSCOPY ENDOSCOPY  06/17/2018  . EYE SURGERY Bilateral    cataracts  . HOLMIUM LASER APPLICATION Right 70/38/8828  Procedure:  HOLMIUM LASER LITHOTRIPSY;  Surgeon: PCleon Gustin MD;  [facility]  Service: Urology;  Laterality: Right;  . HOLMIUM LASER APPLICATION Left 30/08/4915  Procedure: HOLMIUM LASER APPLICATION;  Surgeon: MCleon Gustin MD;  [facility];  Service: Urology;  Laterality: Left;  .  IR KYPHO LUMBAR INC FX REDUCE BONE BX UNI/BIL CANNULATION INC/IMAGING  02/12/2020  . IR THORACENTESIS ASP PLEURAL SPACE W/IMG GUIDE  10/03/2018  . IR  THORACENTESIS ASP PLEURAL SPACE W/IMG GUIDE  06/08/2019  . LEFT HEART CATHETERIZATION WITH CORONARY/GRAFT ANGIOGRAM N/A 02/23/2013   Procedure: LEFT HEART CATHETERIZATION WITH Beatrix Fetters;  Surgeon: Blane Ohara, MD;  Location: [facility] CATH LAB;  Service: Cardiovascular;  Laterality: N/A;  . LOOP RECORDER INSERTION N/A 08/30/2017   Procedure: LOOP RECORDER INSERTION;  Surgeon: Evans Lance, MD;  Location: [place] CV LAB;  Service: Cardiovascular;  Laterality: N/A;  . PORTACATH PLACEMENT Left 01/18/2016   Procedure: INSERTION PORT-A-CATH;  Surgeon: Alphonsa Overall, MD;  Location: [place];  Service: General;  Laterality: Left;  . portacath removal    . REMOVAL OF PLEURAL DRAINAGE CATHETER Right 07/14/2019   Procedure: REMOVAL OF PLEURAL DRAINAGE CATHETER;  Surgeon: Gaye Pollack, MD;  Location: [place];  Service: Thoracic;  Laterality: Right;  . TALC PLEURODESIS N/A 06/26/2019   Procedure: Pietro Cassis;  Surgeon: Gaye Pollack, MD;  [facility];  Service: Thoracic;  Laterality: N/A;  . TONSILLECTOMY    . TRANSTHORACIC ECHOCARDIOGRAM  02-24-2013      mild LVH,  ef 50-55%/  AV bioprosthesis was present with very mild stenosis and no regurg., mean grandient 47mHg, peak grandient 282mg /  mild MR/  mild LAE and RAE/  moderate TR  . TUBAL LIGATION       Medications: Current Meds  Medication Sig  . ACCU-CHEK GUIDE test strip Use to test blood sugar 3 times daily  . acetaminophen (TYLENOL) 500 MG tablet Take 1,000 mg by mouth every 6 (six) hours as needed for moderate pain.  . Marland Kitchenlbuterol (PROVENTIL) (2.5 MG/3ML) 0.083% nebulizer solution Take 3 mLs (2.5 mg total) by nebulization every 6 (six) hours as needed for wheezing or shortness of breath.  . Marland Kitchenlbuterol (VENTOLIN HFA) 108 (90 Base) MCG/ACT inhaler Inhale 2 puffs into the lungs every  6 (six) hours as needed for wheezing or shortness of breath.  . Marland Kitchenmiodarone (PACERONE) 200 MG tablet TAKE ONE TABLET BY MOUTH DAILY (Patient taking differently: Take 200 mg by mouth daily.)  . apixaban (ELIQUIS) 2.5 MG TABS tablet Take 1 tablet (2.5 mg total) by mouth 2 (two) times daily.  . Marland Kitchenzithromycin (ZITHROMAX Z-PAK) 250 MG tablet Take 2 tabs today, then 1 daily until gone.  . Blood Glucose Monitoring Suppl (ACCU-CHEK GUIDE ME) w/Device KIT Use accu chek meter to check blood sugar three times daily.  . budesonide-formoterol (SYMBICORT) 160-4.5 MCG/ACT inhaler Inhale 2 puffs into the lungs 2 (two) times daily.  . cetirizine (ZYRTEC) 10 MG tablet Take 10 mg by mouth daily.  . Cholecalciferol (DIALYVITE VITAMIN D 5000) 125 MCG (5000 UT) capsule Take 5,000 Units by mouth daily.  . Marland Kitchenxemestane (AROMASIN) 25 MG tablet TAKE ONE TABLET BY MOUTH DAILY AFTER BREAKFAST (Patient taking differently: Take 25 mg by mouth daily after breakfast.)  . ezetimibe (ZETIA) 10 MG tablet TAKE ONE TABLET BY MOUTH EVERY DAY (Patient taking differently: Take 10 mg by mouth daily.)  . famotidine (PEPCID) 20 MG tablet Take 20 mg by mouth 2 (two) times daily.  . furosemide (LASIX) 20 MG tablet Take 20 mg by mouth daily as needed for fluid.   . Marland Kitchennsulin glargine (LANTUS) 100 UNIT/ML injection Inject 20 Units into the skin daily.   . Insulin Lispro (HUMALOG KWIKPEN [place]) Inject 6-8 Units into the skin 3 (three) times daily.   . Insulin Pen Needle (BD PEN NEEDLE NANO U/F) 32G X 4 MM MISC USE AS INSTRUCTED TO INJECT INSULIN 4 TIMES DAILY.  . midodrine (  PROAMATINE) 2.5 MG tablet TAKE ONE TABLET BY MOUTH THREE TIMES DAILY WITH MEALS (Patient taking differently: Take 2.5 mg by mouth 3 (three) times daily with meals.)  . nitroGLYCERIN (NITROSTAT) 0.4 MG SL tablet Place 1 tablet (0.4 mg total) under the tongue every 5 (five) minutes as needed for chest pain.  Marland Kitchen ondansetron (ZOFRAN ODT) 4 MG disintegrating tablet Take 1 tablet (4 mg total)  by mouth every 8 (eight) hours as needed. (Patient taking differently: Take 4 mg by mouth every 8 (eight) hours as needed for nausea or vomiting.)  . predniSONE (DELTASONE) 10 MG tablet 60m X3 days, 266mX3 days, 1035m days, then stop.  . rosuvastatin (CRESTOR) 40 MG tablet Take 1 tablet (40 mg total) by mouth at bedtime.  . TRULICITY 1.5 MG/CE/0.2MVPN INJECT THE CONTENTS OF ONE  PEN SUBCUTANEOUSLY WEEKLY  AS DIRECTED (Patient taking differently: Inject 1.5 mg into the skin once a week. Wednesday)  . vitamin B-12 (CYANOCOBALAMIN) 100 MCG tablet Take 100 mcg by mouth daily.  . zMarland Kitchenlpidem (AMBIEN) 5 MG tablet Take 5 mg by mouth at bedtime.  . [DISCONTINUED] neomycin-polymyxin b-dexamethasone (MAXITROL) 3.5-10000-0.1 OINT Place 1 application into both eyes in the morning, at noon, and at bedtime.  . [DISCONTINUED] traMADol (ULTRAM) 50 MG tablet Take 1 tablet (50 mg total) by mouth every 6 (six) hours as needed.     Allergies: Allergies  Allergen Reactions  . Amoxicillin Rash and Other (See Comments)    Tolerates Cephalosporins Has patient had a PCN reaction causing immediate rash, facial/tongue/throat swelling, SOB or lightheadedness with hypotension: Yes Has patient had a PCN reaction causing severe rash involving mucus membranes or skin necrosis: Yes Has patient had a PCN reaction that required hospitalization No Has patient had a PCN reaction occurring within the last 10 years: No If all of the above answers are "NO", then may proceed with Cephalosporin use.   . Tape Other (See Comments)    Pulls off skin, must use paper tape  . Aldactone [Spironolactone] Other (See Comments)    CKD/hypokalemia  . Arimidex [Anastrozole] Nausea Only  . Latex Itching and Other (See Comments)    (Dentist office)  . Tetracycline Rash    Social History: The patient  reports that she has never smoked. She has never used smokeless tobacco. She reports that she does not drink alcohol and does not use  drugs.   Family History: The patient's family history includes Diabetes in her maternal grandmother and son; Healthy in her brother; Heart attack in her brother; Heart disease in her brother, father, and maternal grandmother; Heart failure in her father.   Review of Systems: Please see the history of present illness.   All other systems are reviewed and negative.   Physical Exam: VS:  BP 92/60   Pulse 77   Ht 5' 4"  (1.626 m)   Wt 133 lb 6.4 oz (60.5 kg)   LMP  (LMP Unknown)   SpO2 96%   BMI 22.90 kg/m  .  BMI Body mass index is 22.9 kg/m.  Wt Readings from Last 3 Encounters:  06/13/20 133 lb 6.4 oz (60.5 kg)  06/11/20 140 lb (63.5 kg)  05/21/20 132 lb (59.9 kg)    General: Alert and in no acute distress. Her weight was 135# when I last saw her in November.  She looks chronically ill. Color chronically sallow.  Cardiac: Regular rate and rhythm. Outflow murmur. Trace edema. Has support stockings in place.  Respiratory:  Lungs are coarse -  congested - clears with coughing.   GI: Soft and nontender.  MS: No deformity or atrophy. Gait and ROM intact.  Skin: Warm and dry. Color is sallow.  Neuro:  Strength and sensation are intact and no gross focal deficits noted.  Psych: Alert, appropriate and with normal affect.   LABORATORY DATA:  EKG:  EKG is not ordered today.    Lab Results  Component Value Date   WBC 6.3 06/11/2020   HGB 9.0 (L) 06/11/2020   HCT 29.9 (L) 06/11/2020   PLT 130 (L) 06/11/2020   GLUCOSE 98 06/11/2020   CHOL 106 02/23/2020   TRIG 74 02/23/2020   HDL 33 (L) 02/23/2020   LDLDIRECT 53.0 10/08/2016   LDLCALC 58 02/23/2020   ALT 46 (H) 05/30/2020   AST 49 (H) 05/30/2020   NA 140 06/11/2020   K 3.8 06/11/2020   CL 106 06/11/2020   CREATININE 2.14 (H) 06/11/2020   BUN 37 (H) 06/11/2020   CO2 24 06/11/2020   TSH 2.47 04/04/2020   INR 1.2 05/30/2020   HGBA1C 7.1 (H) 04/04/2020   MICROALBUR 14.2 (H) 07/02/2019     BNP (last 3 results) Recent Labs     06/11/20 1104  BNP 318.2*    ProBNP (last 3 results) No results for input(s): PROBNP in the last 8760 hours.   Other Studies Reviewed Today:  ECHO IMPRESSIONS 04/2020   1. Left ventricular ejection fraction, by estimation, is 55 to 60%. The  left ventricle has normal function. The left ventricle has no regional  wall motion abnormalities. There is mild concentric left ventricular  hypertrophy. Left ventricular diastolic  parameters are consistent with Grade I diastolic dysfunction (impaired  relaxation). Elevated left atrial pressure.   2. Right ventricular systolic function is normal. The right ventricular  size is normal. Tricuspid regurgitation signal is inadequate for assessing  PA pressure.   3. Left atrial size was mild to moderately dilated.   4. The mitral valve is normal in structure. Trivial mitral valve  regurgitation. No evidence of mitral stenosis. Moderate mitral annular  calcification.   5. The aortic valve has been repaired/replaced. Aortic valve  regurgitation is not visualized. No aortic stenosis is present. There is a  21 mm Edwards bioprosthetic valve present in the aortic position.  Procedure Date: 2009. Aortic valve mean gradient  measures 8.3 mmHg. Aortic valve Vmax measures 2.02 m/s.   6. The inferior vena cava is normal in size with greater than 50%  respiratory variability, suggesting right atrial pressure of 3 mmHg.   Comparison(s): No significant change from prior study.   Myoview Study Highlights 04/2020      The left ventricular ejection fraction is normal (55-65%).  Nuclear stress EF: 61%.  There was no ST segment deviation noted during stress.  The study is normal.  This is a low risk study.   Normal stress nuclear study with no ischemia or infarction.  Gated ejection fraction 61% with normal wall motion.   CT CHEST IMPRESSION 04/2019:  Moderate right pleural effusion, increased from prior CT. Associated  right lower lobe  atelectasis.  No evidence of pneumonia.  1.2 x 2.4 cm lesion along the inferior aspect of the right  breast/chest wall. While additional findings noted above correlate  with areas of fat necrosis, this lesion was not clearly identified  when correlating with prior breast tomography report. Breast imaging  consultation is suggested for further evaluation, including  potential ultrasound with biopsy versus breast MR. These results  will be called to the ordering clinician or representative by the  Radiologist Assistant, and communication documented in the PACS or  zVision Dashboard.  Aortic Atherosclerosis (ICD10-I70.0).  Electronically Signed  By: Julian Hy M.D.  On: 05/06/2019 18:11      CT ABDOMEN IMPRESSION 03/2019:  Chest Impression:  1. Mild RIGHT basilar atelectasis and small RIGHT effusion.  2. Increased skin thickening in the lateral RIGHT breast. Recommend  clinical correlation.  3. Stable seroma in the RIGHT breast.    Abdomen / Pelvis Impression:  1. No acute findings in the abdomen pelvis.  2. Nonobstructing calculi in the LEFT renal pelvis.  3. Degenerative endplate change in the lumbar spine similar to  comparison CT 2019.  4. Coronary artery calcification and Aortic Atherosclerosis  (ICD10-I70.0).  Electronically Signed  By: Suzy Bouchard M.D.  On: 03/17/2019 09:59    ABD [place] 02/17/2019  Other findings: There is a somewhat lobulated fluid collection  identified along the inferior aspect of the liver. This measures 2.6  x 3.4 x 4.3 cm. This was not seen on the prior CT examination and  may be related to a focal fluid collection from prior  cholecystectomy. Mild perihepatic ascites is noted.  Note is made of right-sided pleural effusion.  IMPRESSION:  Status post cholecystectomy. There is a lobulated cystic area as  described above along the inferior right hepatic margin. This could  be related to the prior surgery possibly representing a small  seroma.  CT may be helpful for further evaluation.  Small right pleural effusion.  Mild ascites.  Increased echogenicity within the liver likely related to fatty  infiltration or underlying hepatocellular disease.  Electronically Signed  By: Inez Catalina M.D.  On: 02/17/2019 10:18      Breast Ultrasound 01/2019    IMPRESSION:  1. New irregular hypoechoic mass along the posterior margin of the  scar right breast 8 o'clock position. Findings are nonspecific  however concerning for the possibility of localized recurrence.  2. Within the outer aspect of the right axilla there are two  adjacent irregular hypoechoic masses just deep to the skin which are  nonspecific. These may represent focal fat necrosis or potentially  recurrent or metastatic disease.  RECOMMENDATION:  1. Ultrasound-guided core needle biopsy right breast mass 8 o'clock  position just deep to the scar.  2. Ultrasound-guided core needle biopsy of both irregular hypoechoic  masses within the outer aspect of the right axilla.      MYOVIEW FINDINGS FROM 06/2013:  Normal resting EKG. Slight ST depressions noted in aVL after  administration of lexiscan. Mild shortness of breath and nausea  resolved after the test. EKG is nondiagnostic for ischemia. TID  ratio 1.05. Lung-heart ratio 0.43. Normal ventricular chamber size.  IMPRESSION:  No evidence for ischemia. Normal wall motion. LVEF 72%.  Electronically Signed  By: Guy Sandifer      Cardiac Cath: 02/23/2013  Left mainstem: Normal  Left anterior descending (LAD): Severe proximal and mid calcification with long proximal 95% stenosis. The mid and distal vessel is small but free of high grade disease.  Left circumflex (LCx): AV groove has a mid 90% stenosis before a moderate sized MOM. OM1 and OM2 are occluded at the ostium and fill via the SVG. OM3 occluded at the ostium and fills via SVG. The grafted OMs are small and diffusely disease.  Right coronary artery (RCA): Occluded in the  mid vessel. The PDA is moderate sized and occluded at the ostium. There is a  70% stenosis in the PDA after the insertion of the vein graft.  Grafts:  LIMA to LAD: Patent  SVG to RCA: Occluded (from original CABG)  SVG to RCA: Patent (from redo CABG). This is mild diffuse plaque within the vein graft.  SVG to OM3: Patent. This is mild diffuse plaque within the vein graft.  SVG sequential to OM1/OM2: Patent. There is ostial 50% stenosis. There is a patent proximal SVG stent. The native marginals are very small.  Left ventriculography: LV not injected. AVR not crossed.  Final Conclusions: Severe native vessel CAD. Patent grafts as described with nonobstructive disease in the grafts. A stent placed into the sequential SVG is patent with some disease at the ostium that on several views in not occlusive. I did compare the 2009 cath with results today. There is high grade disease in the circumflex AV groove. However, this is not changed from 2009. However, this lesion does appear to lead into a non grafted OM. If she has further symptoms I would consider PCI of the native circumflex.        Assessment/Plan:   1. Recurrent syncope - of unclear etiology - presumed orthostatic - no pauses on her loop - low risk Myoview as well - she has had junctional rhythm noted but not felt to be the culprit. She is off Metoprolol - probably with some increase in her chest pain syndrome. She has seen neurology and had in patient EEG as well. Very challenging.   2. CAD - prior CABG 1998 and then redo in 2009 - last cath 2014 - recent low risk Myoview - I have not been convinced that she does not have underlying ischemia given her situation. Very limited ability to manage medically. Does not tolerate Imdur. No longer on Metoprolol.   3. CKD - follows with Nephrology - Dr. Posey Pronto. She is aware that she is at increased risk for further deterioration of her kidney function if cath needed - would have to have staged cath/PCI if  needed.   4. Elevated LFTs - rechecking today - remains on amiodarone.   5. Chronic anemia - never able to get her colonoscopy due to the fall here in the parking lot with subsequent concussion.   6. Chronic orthostatic hypotension - on Midodrine TID. She uses support stockings.    7. Carotid disease - this was just updated and was stable - ?if she needs transcranial doppler study??   8. PAF - remains on low dose amiodarone - we have not seen recurrence of this. Loop in place - lab today.    9. Breast cancer - not discussed today but still worrisome as well.     10. Chronic anticoagulation   11. Chronic diastolic HF - back on Lasix given ongoing URI.   Current medicines are reviewed with the patient today.  The patient does not have concerns regarding medicines other than what has been noted above.  The following changes have been made:  See above.  Labs/ tests ordered today include:    Orders Placed This Encounter  Procedures  . Basic metabolic panel  . CBC  . Hepatic function panel     Disposition:   FU with Dr. Aundra Dubin as planned - she knows I am leaving next month. I think "fresh eyes" will be good perspective for her.    Patient is agreeable to this plan and will call if any problems develop in the interim.   SignedTruitt Merle, NP  06/13/2020 3:45 PM  [facility] [address] [place], [place]  [address] Phone: [phone] Fax: [phone]

## 2020-05-30 NOTE — Progress Notes (Signed)
vLTM EMU EEG started/ tested event button/ notified neuro

## 2020-05-30 NOTE — H&P (Signed)
History is obtained from: Patient, husband, chart review  HPI: Hannah [name] is a 74 y.o. 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  Patient states she started having these events more than 2 years ago but has had a recent recurrence.  Describes feeling pressure/pain of the back of her head and then does not remember anything after that.  Husband states these episodes usually happen after she had her meal or when she gets up to use the bathroom or coming back from outside (only when she is upright or gets up after sitting position).  It looks like her legs give away and her knees buckled down, appears to have shaking of her legs and if not held will fall down.  Husband also noted jerking/twitching of bilateral lower extremities from waist down, denies any urinary incontinence or tongue bite.  Episodes last for a few minutes.  She has had cardiac work-up including loop recorder as well as echocardiogram which were unremarkable.  Of note, during these episodes her blood pressure has been lower (69-485 systolic).  These episodes were happening multiple times a week.  Metoprolol was stopped after which episodes stopped as well.  However on 05/21/2020 patient had 4 episodes (4 AM, 6 AM, 9 AM and 5 PM).  She was seen at [facility] and was diagnosed with a UTI.  Also had a routine EEG done which showed intermittent diffuse theta slowing consistent with mild encephalopathy.  CT head  without contrast did not show any acute abnormality.  CT cervical spine without contrast did not show any acute fracture or malalignment.  Did show disc bulges at C3-C4 and C4-C5 with mild canal stenosis without significant foraminal stenosis.    Per chart review, patient had similar episodes about 2 years ago and had an EEG which showed mild diffuse slowing without epileptiform discharges.  She was noted to be hypotensive/orthostatic around that time as well.  Epilepsy risk factors: Reports normal birth and early development.  Denies any history of febrile seizures, CNS infections, traumatic brain injury, neurosurgical procedures, family history of seizures.  ROS: All other systems reviewed and negative except as noted in the HPI.   Past Medical History:  Diagnosis Date  . Abnormally small mouth   . Allergic rhinitis 10/14/2009   Qualifier: Diagnosis of  By: Lamonte Sakai MD, Rose Fillers   Overview:  Overview:  Qualifier: Diagnosis of  By: Lamonte Sakai MD, Rose Fillers  Last Assessment & Plan:  Please continue Xyzal and Nasacort as you have been using them  . Anemia   . Asthma 05/12/2009   10/12/2014 p extensive coaching HFA effectiveness =    75% s spacer    Overview:  Overview:  10/12/2014 p extensive coaching HFA effectiveness =    75% s spacer   Last Assessment & Plan:  Please continue Symbicort 2 puffs twice a day. Remember to rinse and gargle after taking this medication. Take albuterol 2 puffs up to every 4 hours if needed for shortness of breath.  Follow with Dr Lamonte Sakai in 6 month  . Bilateral carotid artery stenosis  a. mild 1-39% by duplex 10/2017, due 2021.  . Breast cancer ([place]) 01/18/2016   right breast  . CAD (coronary artery disease) 01/24/2011   a. s/p CABG 1998 with redo 2009.  Marland Kitchen Chemotherapy induced nausea and vomiting 02/24/2016  . 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 Chronic diastolic CHF (congestive heart failure) ([place])  09/24/2013  . CKD (chronic kidney disease), stage IV ([place]) 09/24/2013  . Closed fracture of head of left humerus 09/09/2017  . Complication of anesthesia   . Dysrhythmia    a-fib  . Gallstones   . GERD (gastroesophageal reflux disease)   . Glaucoma   . H/O atrial tachycardia 05/12/2009   Qualifier: History of  By: Lamonte Sakai MD, Rose Fillers   Overview:  Overview:  Qualifier: History of  By: Lamonte Sakai MD, Rose Fillers  Last Assessment & Plan:  There was no evidence of this on her cardiac monitor. I would recommend watchful waiting.   Marland Kitchen Heart murmur   . History of kidney stones   . History of non-ST elevation myocardial infarction (NSTEMI)    Sept 2014--  thought to be type II HTN w/ LHC without infarct related artery and patent grafts  . History of radiation therapy 05/24/16-07/26/16   right breast 50.4 Gy in 28 fractions, right breast boost 10 Gy in 5 fractions  . Hyperlipidemia   . Hypotension   . Iron deficiency anemia   . Mania ([place]) 03/05/2016  . Moderate persistent asthma    pulmologist-  Dr. Malvin Johns  . Osteomyelitis of toe of left foot ([place]) 04/06/2016  . Osteopenia of multiple sites 10/19/2015  . PAF (paroxysmal atrial fibrillation) ([place])   . Personal history of chemotherapy 2017  . Personal history of radiation therapy 2017  . PONV (postoperative nausea and vomiting)   . Port catheter in place 02/17/2016  . Psoriasis    right leg  . Renal calculus, right   . S/P AVR    prosthesis valve placement 2009 at same time re-do CABG  . Seizure-like activity ([place]) 09/01/2017  . Sensorineural hearing loss (SNHL) of both ears 01/06/2016  . Stroke [facility]) 2014   residual rt hearing loss  . Syncope 09/06/2017  . Type 2 diabetes mellitus ([place])    monitored by dr Dwyane Dee    Family History  Problem Relation Age of Onset  . Heart disease Father   . Heart failure Father   . Diabetes Maternal Grandmother   . Heart disease Maternal Grandmother   . Diabetes Son   . Healthy Brother        #1  . Heart attack  Brother        #2  . Heart disease Brother        #2  . Colon cancer Neg Hx   . Esophageal cancer Neg Hx     Social History:  reports that she has never smoked. She has never used smokeless tobacco. She reports that she does not drink alcohol and does not use drugs.   Exam: Current vital signs: LMP  (LMP Unknown)  Vital signs in last 24 hours:     Physical Exam  Constitutional: Appears well-developed and well-nourished.  Psych: Affect appropriate to situation Eyes: No scleral injection HENT: No OP obstrucion Head: Normocephalic.  Cardiovascular: Normal rate and regular rhythm.  Respiratory: Effort normal, non-labored breathing GI: Soft.  No distension. There is no tenderness.  Skin: Warm  Neuro: AOx3, cranial nerves II 12 grossly intact, 5/5 in  all 4 extremities.   I have reviewed labs in epic and the results pertinent to this consultation are: Blood glucose 145, BUN 24 with creatinine 2.18, total protein 5.8, albumin 2.6, AST 49, ALT 46, hemoglobin 9.2 with HCT 31.5 and MCV 104.7, platelets 134.  I have reviewed the images obtained: CT head without contrast 05/21/2020: No acute abnormality.  (Reviewed the report from [facility], images unavailable to review)  ASSESSMENT/PLAN: 73 old female with multiple medical comorbidities as listed above in HPI who is admitted for characterization of syncopal events.   Syncope Convulsions -Description of the events is concerning for orthostatics syncope in the setting of autonomic dysfunction/neuropathy after chemotherapy. -However due to jerking after the episodes, will monitor on video EEG to rule out any potential epileptogenicity. -Discussed with nurses to check blood pressure and heart rate if patient has any syncopal events -Not on any AEDs -Seizure precautions -IV Ativan as needed for GTC more than 2 minutes of focal seizure lasting more than 5 minutes  Coronary artery disease status post CABG Diabetes CKD stage  IV Chronic anemia Chronic thrombocytopenia Breast cancer status post resection and chemo Congestive heart failure with preserved ejection fraction Asthma Bioprosthetic AVR Atrial tachycardia Hypoproteinemia with hypoalbuminemia -Resume home medications -Hypoglycemia and hypoglycemia protocol ordered    Zeb Comfort Epilepsy [facility]

## 2020-05-31 LAB — GLUCOSE, CAPILLARY
Glucose-Capillary: 134 mg/dL — ABNORMAL HIGH (ref 70–99)
Glucose-Capillary: 114 mg/dL — ABNORMAL HIGH (ref 70–99)
Glucose-Capillary: 145 mg/dL — ABNORMAL HIGH (ref 70–99)
Glucose-Capillary: 100 mg/dL — ABNORMAL HIGH (ref 70–99)

## 2020-05-31 NOTE — Progress Notes (Signed)
EMU pt maint complete - no skin breakdown at:   Fp1, Fp2, A1, and A2

## 2020-05-31 NOTE — Procedures (Signed)
Patient Name: Hannah [name]  MRN: [id number]  Epilepsy Attending: Lora Havens  Referring Physician/Provider: Dr. Zeb Comfort Duration: 05/30/2020 1001 to 05/01/2020 1001  Patient history: 74 year old female with syncopal events.  EEG to evaluate for seizures.  Level of alertness: Awake, asleep  AEDs during EEG study: None  Technical aspects: This EEG study was done with scalp electrodes positioned according to the 10-20 International system of electrode placement. Electrical activity was acquired at a sampling rate of 500Hz  and reviewed with a high frequency filter of 70Hz  and a low frequency filter of 1Hz . EEG data were recorded continuously and digitally stored.   Description: The posterior dominant rhythm consists of 8 Hz activity of moderate voltage (25-35 uV) seen predominantly in posterior head regions, symmetric and reactive to eye opening and eye closing.  Sleep was characterized by vertex waves, sleep spindles (12 to 14 Hz), maximal frontocentral region.  There is 15 to 18 Hz beta activity distributed symmetrically and diffusely. EEG also showed intermittent generalized 5-6 Hz theta slowing of elderly in left temporal region.  Hyperventilation and photic stimulation were not performed.     IMPRESSION: This study is within normal limits. No seizures or epileptiform discharges were seen throughout the recording.   Tonette Koehne Barbra Sarks

## 2020-05-31 NOTE — Progress Notes (Signed)
Subjective: NAEO. No new concerns  ROS: negative except above  Examination  Vital signs in last 24 hours: Temp:  [98 F (36.7 C)-98.2 F (36.8 C)] 98 F (36.7 C) (12/21 1125) Pulse Rate:  [67-81] 74 (12/21 1125) Resp:  [13-20] 18 (12/21 1125) BP: (127-173)/(49-75) 127/60 (12/21 1125) SpO2:  [97 %-100 %] 99 % (12/21 1125)  General: sitting in recliner, nad CVS: pulse-normal rate and rhythm RS: breathing comfortably Extremities: normal   Neuro: Neuro: AOx3, cranial nerves 2- 12 grossly intact, 5/5 in all 4 extremities.  Basic Metabolic Panel: Recent Labs  Lab 05/30/20 0905  NA 141  K 4.0  CL 107  CO2 25  GLUCOSE 145*  BUN 24*  CREATININE 2.18*  CALCIUM 8.9  MG 1.8  PHOS 4.4    CBC: Recent Labs  Lab 05/30/20 0905  WBC 4.3  NEUTROABS 2.9  HGB 9.2*  HCT 31.5*  MCV 104.7*  PLT 134*     Coagulation Studies: Recent Labs    05/30/20 0905  LABPROT 14.6  INR 1.2    Imaging No new brain imaging  ASSESSMENT AND PLAN: 39 old female with multiple medical comorbidities as listed above in HPI who is admitted for characterization of syncopal events.   Syncope Convulsions -Description of the events is concerning for orthostatics syncope in the setting of autonomic dysfunction/neuropathy after chemotherapy. -Continue video EEG to rule out any potential epileptogenicity. -Discussed with nurses to check blood pressure and heart rate if patient has any syncopal events -Not on any AEDs -Seizure precautions -IV Ativan as needed for GTC more than 2 minutes of focal seizure lasting more than 5 minutes  Coronary artery disease status post CABG Diabetes CKD stage IV Chronic anemia Chronic thrombocytopenia Breast cancer status post resection and chemo Congestive heart failure with preserved ejection fraction Asthma Bioprosthetic AVR Atrial tachycardia Hypoproteinemia with hypoalbuminemia -Resume home medications -Hypoglycemia and hypoglycemia protocol  ordered  I have spent a total of 25 minutes with the patient reviewing hospital notes,  test results, labs and examining the patient as well as establishing an assessment and plan that was discussed personally with the patient.  > 50% of time was spent in direct patient care.       Zeb Comfort Epilepsy [facility] For questions after 5pm please refer to AMION to reach the Neurologist on call

## 2020-06-01 ENCOUNTER — Inpatient Hospital Stay (HOSPITAL_COMMUNITY): Payer: Medicare Other

## 2020-06-01 ENCOUNTER — Ambulatory Visit: Payer: Medicare Other | Admitting: Nurse Practitioner

## 2020-06-01 DIAGNOSIS — R55 Syncope and collapse: Secondary | ICD-10-CM

## 2020-06-01 LAB — GLUCOSE, CAPILLARY
Glucose-Capillary: 134 mg/dL — ABNORMAL HIGH (ref 70–99)
Glucose-Capillary: 74 mg/dL (ref 70–99)
Glucose-Capillary: 138 mg/dL — ABNORMAL HIGH (ref 70–99)

## 2020-06-01 NOTE — Plan of Care (Signed)
Pt alert and oriented. No sleep aids used last night. Pt stayed up as long as she could. No events as of yet. 2 assist maintained at all times during bathroom requests. Pt is on RA.  Problem: Education: Goal: Knowledge of General Education information will improve Description: Including pain rating scale, medication(s)/side effects and non-pharmacologic comfort measures Outcome: Progressing   Problem: Health Behavior/Discharge Planning: Goal: Ability to manage health-related needs will improve Outcome: Progressing   Problem: Clinical Measurements: Goal: Ability to maintain clinical measurements within normal limits will improve Outcome: Progressing Goal: Will remain free from infection Outcome: Progressing Goal: Diagnostic test results will improve Outcome: Progressing Goal: Respiratory complications will improve Outcome: Progressing Goal: Cardiovascular complication will be avoided Outcome: Progressing   Problem: Activity: Goal: Risk for activity intolerance will decrease Outcome: Progressing   Problem: Nutrition: Goal: Adequate nutrition will be maintained Outcome: Progressing   Problem: Coping: Goal: Level of anxiety will decrease Outcome: Progressing   Problem: Elimination: Goal: Will not experience complications related to bowel motility Outcome: Progressing Goal: Will not experience complications related to urinary retention Outcome: Progressing   Problem: Pain Managment: Goal: General experience of comfort will improve Outcome: Progressing   Problem: Safety: Goal: Ability to remain free from injury will improve Outcome: Progressing   Problem: Skin Integrity: Goal: Risk for impaired skin integrity will decrease Outcome: Progressing   Problem: Education: Goal: Expressions of having a comfortable level of knowledge regarding the disease process will increase Outcome: Progressing   Problem: Coping: Goal: Ability to adjust to condition or change in health  will improve Outcome: Progressing Goal: Ability to identify appropriate support needs will improve Outcome: Progressing   Problem: Health Behavior/Discharge Planning: Goal: Compliance with prescribed medication regimen will improve Outcome: Progressing   Problem: Medication: Goal: Risk for medication side effects will decrease Outcome: Progressing   Problem: Clinical Measurements: Goal: Complications related to the disease process, condition or treatment will be avoided or minimized Outcome: Progressing Goal: Diagnostic test results will improve Outcome: Progressing   Problem: Safety: Goal: Verbalization of understanding the information provided will improve Outcome: Progressing   Problem: Self-Concept: Goal: Level of anxiety will decrease Outcome: Progressing Goal: Ability to verbalize feelings about condition will improve Outcome: Progressing

## 2020-06-01 NOTE — Discharge Instructions (Addendum)
Hannah [name] was admitted to epilepsy monitoring unit from 05/30/2020 to 06/01/2020 during which he underwent continuous video EEG monitoring.  Photic stimulation and hyperventilation which did not precipitate any typical event.  EEG was within normal limits without any epileptiform discharges.  MR head, MRA head and neck without contrast were performed as part of syncope work-up which didn't show any significant narrowing.  Patient has had significant work-up for evaluation of these events including Holter monitor (did not show any cardiac abnormality during the events), echocardiogram, 48 hours of EEG monitoring as well as MRA head and neck without contrast.  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  Patient is already on midodrine and has compression stockings.  Also discussed some changes to manage autonomic dysfunction including avoiding heavy meals and instead have small servings at different times, get up slowly and count to 10 if needed before moving especially when getting up after prolonged period of sitting or laying down.  Patient's husband also noted that these episodes are less frequent when her blood pressure is borderline hypertensive.  At this point, I would agree that it might be reasonable to leave the patient borderline hypotensive rather than trying to achieve normotension to minimize these episodes.  Will also discuss with Dr. Delice Lesch regarding possible nerve conduction study and testing for autonomic dysfunction.  Also discussed precautions including do not drive for 6 months due to unexplained episodes of loss of awareness.

## 2020-06-01 NOTE — Procedures (Addendum)
Patient Name: Hannah [name]  MRN: [id number]  Epilepsy Attending: Lora Havens  Referring Physician/Provider: Dr. Zeb Comfort Duration: 05/31/2020 1001 to 05/02/2020 1025  Patient history: 74 year old female with syncopal events.  EEG to evaluate for seizures.  Level of alertness: Awake, asleep  AEDs during EEG study: None  Technical aspects: This EEG study was done with scalp electrodes positioned according to the 10-20 International system of electrode placement. Electrical activity was acquired at a sampling rate of 500Hz  and reviewed with a high frequency filter of 70Hz  and a low frequency filter of 1Hz . EEG data were recorded continuously and digitally stored.   Description: The posterior dominant rhythm consists of 8 Hz activity of moderate voltage (25-35 uV) seen predominantly in posterior head regions, symmetric and reactive to eye opening and eye closing.  Sleep was characterized by vertex waves, sleep spindles (12 to 14 Hz), maximal frontocentral region.  There is 15 to 18 Hz beta activity distributed symmetrically and diffusely. EEG also showed intermittent generalized 5-6 Hz theta slowing of elderly in left temporal region.  No EEG change was seen during hyperventilation. Physiologic photic driving was seen during photic stimulation.     IMPRESSION: This study is within normal limits. No seizures or epileptiform discharges were seen throughout the recording.   Aadon Gorelik Barbra Sarks

## 2020-06-01 NOTE — Discharge Summary (Signed)
Physician Discharge Summary  Patient ID: Hannah [name] MRN: [id number] DOB/AGE: 1946-05-26 74 y.o.  Admit date: 05/30/2020 Discharge date: 06/01/2020  Admission Diagnoses: Convulsion  Discharge Diagnoses: Syncope  Discharged Condition: stable  Hospital Course:   Syncope Convulsions Ms. Hinchey was admitted to epilepsy monitoring unit from 05/30/2020 to 06/01/2020 during which he underwent continuous video EEG monitoring.  Photic stimulation and hyperventilation which did not precipitate any typical event.  EEG was within normal limits without any epileptiform discharges.  MRA head and neck without contrast were performed as part of syncope work-up which showed mild mid left superior cerebellar artery narrowing and 30% proximal right ICA luminal narrowing.   Patient has had significant work-up for evaluation of these events including Holter monitor (did not show any cardiac abnormality during the events), echocardiogram, 48 hours of EEG monitoring as well as MRA head and neck without contrast.  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  Patient is already on midodrine and has compression stockings.  Also discussed some changes to manage autonomic dysfunction including avoiding heavy meals and instead have small servings at different times, get up slowly and count to 10 if needed before moving especially when getting up after prolonged period of sitting or laying down.  Patient's husband also noted that these episodes are less frequent when her blood pressure is borderline hypertensive.  At this point, I would agree that it might be reasonable to leave the patient borderline hypotensive rather than trying to achieve normotension to minimize these episodes.  Also recommend obtaining nerve conduction study and possible autonomic testing to evaluate for autonomic dysfunction.  Also discussed  precautions including do not drive for 6 months due to unexplained episodes of loss of awareness.  Coronary artery disease status post CABG Diabetes CKD stage IV Chronic anemia Chronic thrombocytopenia Breast cancer status post resection and chemo Congestive heart failure with preserved ejection fraction Asthma Bioprosthetic AVR Atrial tachycardia Hypoproteinemia with hypoalbuminemia -Continue home medications -Patient also reported seeing a dietitian to help with hypoproteinemia- hypoalbuminemia  Consults: None  Significant Diagnostic Studies: Video EEG monitoring 05/30/2020-06/01/2020: The study is within normal limits.  No seizures or epileptiform discharges were seen during the study.  Treatments: Continue home medications  Discharge Exam: Blood pressure 128/74, pulse 85, temperature 97.9 F (36.6 C), temperature source Oral, resp. rate 17, SpO2 95 %. General: sitting in recliner, nad CVS: pulse-normal rate and rhythm RS: breathing comfortably Extremities: normal  Neuro: Neuro:AOx3, cranial nerves 2- 12 grossly intact, 5/5 in all 4 extremities.  Disposition: Home   Allergies as of 06/01/2020      Reactions   Amoxicillin Rash, Other (See Comments)   Tolerates Cephalosporins Has patient had a PCN reaction causing immediate rash, facial/tongue/throat swelling, SOB or lightheadedness with hypotension: Yes Has patient had a PCN reaction causing severe rash involving mucus membranes or skin necrosis: Yes Has patient had a PCN reaction that required hospitalization No Has patient had a PCN reaction occurring within the last 10 years: No If all of the above answers are "NO", then may proceed with Cephalosporin use.   Tape Other (See Comments)   Pulls off skin, must use paper tape   Aldactone [spironolactone] Other (See Comments)   CKD/hypokalemia   Arimidex [anastrozole] Nausea Only   Latex Itching, Other (See Comments)   (Dentist office)   Tetracycline Rash       Medication List    TAKE these medications  Accu-Chek Guide Me w/Device Kit Use accu chek meter to check blood sugar three times daily.   Accu-Chek Guide test strip Generic drug: glucose blood Use to test blood sugar 3 times daily   acetaminophen 500 MG tablet Commonly known as: TYLENOL Take 1,000 mg by mouth every 6 (six) hours as needed for moderate pain.   albuterol 108 (90 Base) MCG/ACT inhaler Commonly known as: VENTOLIN HFA Inhale 2 puffs into the lungs every 6 (six) hours as needed for wheezing or shortness of breath.   amiodarone 200 MG tablet Commonly known as: PACERONE TAKE ONE TABLET BY MOUTH DAILY   apixaban 2.5 MG Tabs tablet Commonly known as: ELIQUIS Take 1 tablet (2.5 mg total) by mouth 2 (two) times daily.   BD Pen Needle Nano U/F 32G X 4 MM Misc Generic drug: Insulin Pen Needle USE AS INSTRUCTED TO INJECT INSULIN 4 TIMES DAILY.   budesonide-formoterol 160-4.5 MCG/ACT inhaler Commonly known as: SYMBICORT Inhale 2 puffs into the lungs 2 (two) times daily.   cetirizine 10 MG tablet Commonly known as: ZYRTEC Take 10 mg by mouth daily.   Dialyvite Vitamin D 5000 125 MCG (5000 UT) capsule Generic drug: Cholecalciferol Take 5,000 Units by mouth daily.   exemestane 25 MG tablet Commonly known as: AROMASIN TAKE ONE TABLET BY MOUTH DAILY AFTER BREAKFAST What changed: See the new instructions.   ezetimibe 10 MG tablet Commonly known as: ZETIA TAKE ONE TABLET BY MOUTH EVERY DAY   famotidine 20 MG tablet Commonly known as: PEPCID Take 20 mg by mouth 2 (two) times daily.   furosemide 20 MG tablet Commonly known as: LASIX Take 20 mg by mouth daily as needed for fluid.   HUMALOG KWIKPEN [place] Inject 6-8 Units into the skin 3 (three) times daily.   insulin glargine 100 UNIT/ML injection Commonly known as: LANTUS Inject 20 Units into the skin daily.   midodrine 2.5 MG tablet Commonly known as: PROAMATINE TAKE ONE TABLET BY MOUTH THREE TIMES DAILY  WITH MEALS   neomycin-polymyxin b-dexamethasone 3.5-10000-0.1 Oint Commonly known as: MAXITROL Place 1 application into both eyes in the morning, at noon, and at bedtime.   nitroGLYCERIN 0.4 MG SL tablet Commonly known as: NITROSTAT Place 1 tablet (0.4 mg total) under the tongue every 5 (five) minutes as needed for chest pain.   ondansetron 4 MG disintegrating tablet Commonly known as: Zofran ODT Take 1 tablet (4 mg total) by mouth every 8 (eight) hours as needed. What changed: reasons to take this   rosuvastatin 40 MG tablet Commonly known as: CRESTOR Take 1 tablet (40 mg total) by mouth at bedtime.   traMADol 50 MG tablet Commonly known as: ULTRAM Take 1 tablet (50 mg total) by mouth every 6 (six) hours as needed.   Trulicity 1.5 MR/6.1HH Sopn Generic drug: Dulaglutide INJECT THE CONTENTS OF ONE  PEN SUBCUTANEOUSLY WEEKLY  AS DIRECTED What changed: See the new instructions.   vitamin B-12 100 MCG tablet Commonly known as: CYANOCOBALAMIN Take 100 mcg by mouth daily.   zolpidem 5 MG tablet Commonly known as: AMBIEN Take 5 mg by mouth at bedtime.       I have spent a total of 35  minutes with the patient reviewing hospital notes,  test results, labs and examining the patient as well as establishing an assessment and plan that was discussed personally with the patient.  > 50% of time was spent in direct patient care.       Signed: Lora Havens 06/01/2020,  11:49 AM

## 2020-06-01 NOTE — Progress Notes (Addendum)
EMU LTM EEG discontinued - no skin breakdown at unhook.  °

## 2020-06-01 NOTE — Progress Notes (Signed)
Discharge paperwork provided to patient and patient's husband. Questions answered. Patient confirmed possession of all of her belongings. Patient is ready for discharge.

## 2020-06-06 ENCOUNTER — Telehealth: Payer: Self-pay | Admitting: Emergency Medicine

## 2020-06-06 MED ORDER — PREDNISONE 10 MG PO TABS: ORAL_TABLET | ORAL | 0 refills | Status: DC

## 2020-06-06 MED ORDER — AZITHROMYCIN 250 MG PO TABS: ORAL_TABLET | ORAL | 0 refills | Status: DC

## 2020-06-06 NOTE — Telephone Encounter (Signed)
rx's sent to pharmacy. Pt aware of recs.  Pt states she was covid tested last week but it came back negative.  Pt advised that she started experiencing symptoms after the test.  I advised she get tested again.  Pt expressed understanding.  Nothing further needed at this time- will close encounter.

## 2020-06-06 NOTE — Telephone Encounter (Signed)
Spoke with pt, c/o increased sob, runny nose, pnd, sometimes prod cough with yellow thick mucus.  S/s present X2-3 days.  Denies fever, chest pain.   Pt has been taking zyrtec, albuterol inhaler every 4 hours, symbicort BID  Pt has received flu shot, is not covid vaccinated.   Pharmacy: [facility].   Sending to [place] for recs- please advise.  Thanks!

## 2020-06-06 NOTE — Telephone Encounter (Signed)
This probably started as a viral process, which means she should strongly consider being COVID tested. I would recommend that she is  We can treat for associated exacerbation with prednisone >> Take 30mg  daily for 3 days, then 20mg  daily for 3 days, then 10mg  daily for 3 days, then stop Also azithromycin > Z pack

## 2020-06-07 NOTE — Progress Notes (Signed)
[facility] Summary Report / Loop Recorder 

## 2020-06-09 ENCOUNTER — Telehealth: Payer: Self-pay | Admitting: Emergency Medicine

## 2020-06-09 MED ORDER — ALBUTEROL SULFATE (2.5 MG/3ML) 0.083% IN NEBU: 2.5000 mg | INHALATION_SOLUTION | Freq: Four times a day (QID) | RESPIRATORY_TRACT | 3 refills | Status: AC | PRN

## 2020-06-09 NOTE — Telephone Encounter (Signed)
Spoke with patient. She is requesting a refill on her albuterol neb solution. She stated that she has not had a new RX in a few years due to not needing to use it.  I explained to her that since the medication was removed completely from her medication list, I would need to ask before sending into her pharmacy. She verbalized understanding.   I looked in her chart and it looks like the last time she received a refill, it was back in 2014.   TP, would be you be ok with [place] ordering the solution for her?

## 2020-06-09 NOTE — Telephone Encounter (Signed)
Dx with asthmatic Brochitis , rx zpack and prednisone .  Feeling some better, wants refill of albuterol neb, out of date, starting to cough up mucus now.  No fever, dyspnea or loss of taste/smell.  Neg for Covid .   Not vaccinated.   Will refill albuterol . Finish rx  F/up as planned and As needed   Please contact office for sooner follow up if symptoms do not improve or worsen or seek emergency care

## 2020-06-09 NOTE — Telephone Encounter (Signed)
Left message for patient to call back  

## 2020-06-11 ENCOUNTER — Emergency Department (HOSPITAL_BASED_OUTPATIENT_CLINIC_OR_DEPARTMENT_OTHER): Payer: Medicare Other

## 2020-06-11 ENCOUNTER — Telehealth: Payer: Self-pay | Admitting: Pulmonary Disease

## 2020-06-11 ENCOUNTER — Other Ambulatory Visit: Payer: Self-pay

## 2020-06-11 ENCOUNTER — Emergency Department (HOSPITAL_BASED_OUTPATIENT_CLINIC_OR_DEPARTMENT_OTHER)
Admission: EM | Admit: 2020-06-11 | Discharge: 2020-06-11 | Disposition: A | Discharge status: Home / Self Care | Payer: Medicare Other | Attending: Emergency Medicine | Admitting: Emergency Medicine

## 2020-06-11 ENCOUNTER — Encounter (HOSPITAL_BASED_OUTPATIENT_CLINIC_OR_DEPARTMENT_OTHER): Payer: Self-pay | Admitting: Emergency Medicine

## 2020-06-11 DIAGNOSIS — I509 Heart failure, unspecified: Secondary | ICD-10-CM

## 2020-06-11 DIAGNOSIS — Z7901 Long term (current) use of anticoagulants: Secondary | ICD-10-CM | POA: Insufficient documentation

## 2020-06-11 DIAGNOSIS — Z794 Long term (current) use of insulin: Secondary | ICD-10-CM | POA: Diagnosis not present

## 2020-06-11 DIAGNOSIS — N184 Chronic kidney disease, stage 4 (severe): Secondary | ICD-10-CM | POA: Insufficient documentation

## 2020-06-11 DIAGNOSIS — J4540 Moderate persistent asthma, uncomplicated: Secondary | ICD-10-CM | POA: Insufficient documentation

## 2020-06-11 DIAGNOSIS — I2510 Atherosclerotic heart disease of native coronary artery without angina pectoris: Secondary | ICD-10-CM | POA: Diagnosis not present

## 2020-06-11 DIAGNOSIS — Z91040 Latex allergy status: Secondary | ICD-10-CM | POA: Diagnosis not present

## 2020-06-11 DIAGNOSIS — Z951 Presence of aortocoronary bypass graft: Secondary | ICD-10-CM | POA: Insufficient documentation

## 2020-06-11 DIAGNOSIS — Z7951 Long term (current) use of inhaled steroids: Secondary | ICD-10-CM | POA: Diagnosis not present

## 2020-06-11 DIAGNOSIS — I5043 Acute on chronic combined systolic (congestive) and diastolic (congestive) heart failure: Secondary | ICD-10-CM | POA: Diagnosis not present

## 2020-06-11 DIAGNOSIS — Z79899 Other long term (current) drug therapy: Secondary | ICD-10-CM | POA: Diagnosis not present

## 2020-06-11 DIAGNOSIS — R0602 Shortness of breath: Secondary | ICD-10-CM | POA: Diagnosis present

## 2020-06-11 DIAGNOSIS — Z20822 Contact with and (suspected) exposure to covid-19: Secondary | ICD-10-CM | POA: Insufficient documentation

## 2020-06-11 DIAGNOSIS — I130 Hypertensive heart and chronic kidney disease with heart failure and stage 1 through stage 4 chronic kidney disease, or unspecified chronic kidney disease: Secondary | ICD-10-CM | POA: Diagnosis not present

## 2020-06-11 DIAGNOSIS — Z853 Personal history of malignant neoplasm of breast: Secondary | ICD-10-CM | POA: Diagnosis not present

## 2020-06-11 DIAGNOSIS — Z8673 Personal history of transient ischemic attack (TIA), and cerebral infarction without residual deficits: Secondary | ICD-10-CM | POA: Diagnosis not present

## 2020-06-11 LAB — BASIC METABOLIC PANEL
Anion gap: 10 (ref 5–15)
BUN: 37 mg/dL — ABNORMAL HIGH (ref 8–23)
CO2: 24 mmol/L (ref 22–32)
Calcium: 8.7 mg/dL — ABNORMAL LOW (ref 8.9–10.3)
Chloride: 106 mmol/L (ref 98–111)
Creatinine, Ser: 2.14 mg/dL — ABNORMAL HIGH (ref 0.44–1.00)
GFR, Estimated: 24 mL/min — ABNORMAL LOW (ref >60)
Glucose, Bld: 98 mg/dL (ref 70–99)
Potassium: 3.8 mmol/L (ref 3.5–5.1)
Sodium: 140 mmol/L (ref 135–145)

## 2020-06-11 LAB — BRAIN NATRIURETIC PEPTIDE: B Natriuretic Peptide: 318.2 pg/mL — ABNORMAL HIGH (ref 0.0–100.0)

## 2020-06-11 LAB — CBC
HCT: 29.9 % — ABNORMAL LOW (ref 36.0–46.0)
Hemoglobin: 9 g/dL — ABNORMAL LOW (ref 12.0–15.0)
MCH: 31.4 pg (ref 26.0–34.0)
MCHC: 30.1 g/dL (ref 30.0–36.0)
MCV: 104.2 fL — ABNORMAL HIGH (ref 80.0–100.0)
Platelets: 130 10*3/uL — ABNORMAL LOW (ref 150–400)
RBC: 2.87 MIL/uL — ABNORMAL LOW (ref 3.87–5.11)
RDW: 14 % (ref 11.5–15.5)
WBC: 6.3 10*3/uL (ref 4.0–10.5)
nRBC: 0 % (ref 0.0–0.2)

## 2020-06-11 LAB — SARS CORONAVIRUS 2 (TAT 6-24 HRS): SARS Coronavirus 2: NEGATIVE

## 2020-06-11 MED ORDER — MAGNESIUM SULFATE 2 GM/50ML IV SOLN
2.0000 g | Freq: Once | INTRAVENOUS | Status: AC
  Administered 2020-06-11: 2 g via INTRAVENOUS
  Filled 2020-06-11: qty 50

## 2020-06-11 MED ORDER — FUROSEMIDE 10 MG/ML IJ SOLN
40.0000 mg | Freq: Once | INTRAMUSCULAR | Status: AC
  Administered 2020-06-11: 40 mg via INTRAVENOUS
  Filled 2020-06-11: qty 4

## 2020-06-11 MED ORDER — ALBUTEROL SULFATE HFA 108 (90 BASE) MCG/ACT IN AERS
8.0000 | INHALATION_SPRAY | Freq: Once | RESPIRATORY_TRACT | Status: AC
  Administered 2020-06-11: 8 via RESPIRATORY_TRACT
  Filled 2020-06-11: qty 6.7

## 2020-06-11 NOTE — ED Triage Notes (Signed)
SOB x 1 week. Her dr called in abx and steroids. Symptoms persist.

## 2020-06-11 NOTE — ED Provider Notes (Signed)
[facility] EMERGENCY DEPARTMENT Provider Note   CSN: [id number] Arrival date & time: 06/11/20  1031     History Chief Complaint  Patient presents with  . Shortness of Breath    Hannah [name] is a 75 y.o. female with PMHx HTN, HLD, CAD, Diabetes, PAF on Eliquis, CHF with EF 55-60%, Asthma who presents to the ED today via EMS with complaint of shortness of breath. Pt reports that 5-6 days ago she began having a productive cough, wheezing, and shortness of breath. She had a telemedicine visit with her pulmonologist who called her in azithromycin and prednisone. She has finished the azithromycin and is still taking the prednisone however has not noticed any relief. Pt then called her pulmonologist back requesting that they refill her nebulizer solution which she has not been on since 2014 - they refilled it and she has been using that q 6 hours without relief. She was told that they could see her in the office early this week however if she was having worsening symptoms she would need to go to an UC or ED for further evaluation, prompting pt to come here today. Pt denies any fevers or chills. She denies any recent sick contacts and states she "does not go anywhere." Pt had a COVID test 2 weeks ago prior to having an EEG done for syncopal episodes however began feeling ill afterwards. She does not believe she has COVID however is unvaccinated. Pt also reports leg swelling for the past week which is new for patient - she reports hx of CHF however was taken off of Lasix 2-3 months ago.   The history is provided by the patient, medical records and the EMS personnel.       Past Medical History:  Diagnosis Date  . Abnormally small mouth   . Allergic rhinitis 10/14/2009   Qualifier: Diagnosis of  By: Lamonte Sakai MD, Rose Fillers   Overview:  Overview:  Qualifier: Diagnosis of  By: Lamonte Sakai MD, Rose Fillers  Last Assessment & Plan:  Please continue Xyzal and Nasacort as you have been using them  . Anemia   .  Asthma 05/12/2009   10/12/2014 p extensive coaching HFA effectiveness =    75% s spacer    Overview:  Overview:  10/12/2014 p extensive coaching HFA effectiveness =    75% s spacer   Last Assessment & Plan:  Please continue Symbicort 2 puffs twice a day. Remember to rinse and gargle after taking this medication. Take albuterol 2 puffs up to every 4 hours if needed for shortness of breath.  Follow with Dr Lamonte Sakai in 6 month  . Bilateral carotid artery stenosis    a. mild 1-39% by duplex 10/2017, due 2021.  . Breast cancer ([place]) 01/18/2016   right breast  . CAD (coronary artery disease) 01/24/2011   a. s/p CABG 1998 with redo 2009.  Marland Kitchen Chemotherapy induced nausea and vomiting 02/24/2016  . Chemotherapy-induced peripheral neuropathy ([place]) 04/27/2016  . 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 Chronic diastolic CHF (congestive heart failure) ([place]) 09/24/2013  . CKD (chronic kidney disease), stage IV ([place]) 09/24/2013  . Closed fracture of head of left humerus 09/09/2017  . Complication of anesthesia   . Dysrhythmia    a-fib  . Gallstones   . GERD (gastroesophageal reflux disease)   . Glaucoma   . H/O atrial tachycardia 05/12/2009   Qualifier: History of  By: Lamonte Sakai MD, Rose Fillers   Overview:  Overview:  Qualifier: History of  By: Lamonte Sakai  MD, Rose Fillers  Last Assessment & Plan:  There was no evidence of this on her cardiac monitor. I would recommend watchful waiting.   Marland Kitchen Heart murmur   . History of kidney stones   . History of non-ST elevation myocardial infarction (NSTEMI)    Sept 2014--  thought to be type II HTN w/ LHC without infarct related artery and patent grafts  . History of radiation therapy 05/24/16-07/26/16   right breast 50.4 Gy in 28 fractions, right breast boost 10 Gy in 5 fractions  . Hyperlipidemia   . Hypotension   . Iron deficiency anemia   . Mania ([place]) 03/05/2016  . Moderate persistent asthma    pulmologist-  Dr. Malvin Johns  . Osteomyelitis of toe of left foot ([place]) 04/06/2016  .  Osteopenia of multiple sites 10/19/2015  . PAF (paroxysmal atrial fibrillation) ([place])   . Personal history of chemotherapy 2017  . Personal history of radiation therapy 2017  . PONV (postoperative nausea and vomiting)   . Port catheter in place 02/17/2016  . Psoriasis    right leg  . Renal calculus, right   . S/P AVR    prosthesis valve placement 2009 at same time re-do CABG  . Seizure-like activity ([place]) 09/01/2017  . Sensorineural hearing loss (SNHL) of both ears 01/06/2016  . Stroke [facility]) 2014   residual rt hearing loss  . Syncope 09/06/2017  . Type 2 diabetes mellitus ([place])    monitored by dr Dwyane Dee    Patient Active Problem List   Diagnosis Date Noted  . Convulsion ([place]) 05/30/2020  . Compression fracture of L1 vertebra with routine healing 02/25/2020  . Compression fracture of L1 lumbar vertebra, closed, initial encounter ([place]) 01/27/2020  . Pain in left shoulder 03/26/2019  . History of breast cancer in female 03/26/2019  . Hypokalemia 02/20/2019  . Acute exacerbation of CHF (congestive heart failure) ([place]) 02/19/2019  . Pleural effusion on right 09/25/2018  . Thrombocytopenia ([place]) 09/25/2018  . Cholecystitis with cholelithiasis 07/07/2018  . Prolonged Q-T interval on ECG 05/21/2018  . Atrial fibrillation ([place]) 01/23/2018  . Closed fracture of head of left humerus 09/09/2017  . Syncope 09/06/2017  . Seizure-like activity ([place]) 09/01/2017  . AKI (acute kidney injury) ([place]) 09/01/2017  . Adrenal insufficiency ([place]) 07/03/2016  . Seizure ([place]) 06/29/2016  . Chemotherapy-induced peripheral neuropathy ([place]) 04/27/2016  . 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 Osteomyelitis of toe of left foot ([place]) 04/06/2016  . Mania ([place]) 03/05/2016  . GERD (gastroesophageal reflux disease) 02/29/2016  . Chemotherapy induced nausea and vomiting 02/24/2016  . Port catheter in place 02/17/2016  . Sensorineural hearing loss (SNHL) of both ears 01/06/2016  . Breast cancer of  upper-inner quadrant of right female breast ([place]) 12/16/2015  . Osteopenia of multiple sites 10/19/2015  . CKD (chronic kidney disease), stage IV ([place]) 09/24/2013  . Chronic diastolic CHF (congestive heart failure) ([place]) 09/24/2013  . Acute on chronic combined systolic and diastolic congestive heart failure ([place]) 07/17/2013  . Atrial tachycardia ([place]) 04/03/2013  . CAD (coronary artery disease) 01/24/2011  . Chronic coronary artery disease 01/24/2011  . History of aortic valve replacement 10/27/2010  . Hyperlipidemia   . DOE (dyspnea on exertion)   . Nonischemic cardiomyopathy ([place])   . Anemia of chronic disease   . Type 2 diabetes mellitus ([place])   . Iron deficiency anemia   . Allergic rhinitis 10/14/2009  . Essential hypertension 05/12/2009  . H/O atrial tachycardia 05/12/2009  . Asthma 05/12/2009    Past  Surgical History:  Procedure Laterality Date  . AORTIC VALVE REPLACEMENT  2009   [facility]done same time is [facility]G)  . BREAST BIOPSY Right 02/12/2019   2 lymphnodes, 1 breast  . BREAST LUMPECTOMY Right 01/18/2016  . BREAST LUMPECTOMY WITH RADIOACTIVE SEED AND SENTINEL LYMPH NODE BIOPSY Right 01/18/2016   Procedure: RIGHT BREAST LUMPECTOMY WITH RADIOACTIVE SEED AND SENTINEL LYMPH NODE BIOPSY;  Surgeon: DAlphonsa Overall MD;  [facility]  Service: General;  Laterality: Right;  . CARDIAC CATHETERIZATION  03/23/2008   Pre-redo CABG: L main OK, LAD (T), CFX (T), OM1 99%, RCA (T), LIMA-LAD OK, SVG-OM(?3) OK w/ little florw to OM2, SVG-RCA OK. EF NL  . CARPAL TUNNEL RELEASE    . CHEST TUBE INSERTION Right 06/26/2019   Procedure: INSERTION PLEURAL DRAINAGE CATHETER;  Surgeon: BGaye Pollack MD;  [facility]  Service: Thoracic;  Laterality: Right;  . CHOLECYSTECTOMY N/A 07/07/2018   Procedure: LAPAROSCOPIC CHOLECYSTECTOMY WITH INTRAOPERATIVE CHOLANGIOGRAM ERAS PATHWAY;  Surgeon: NAlphonsa Overall MD;  [facility];  Service: General;  Laterality:  N/A;  . COLONOSCOPY     around 2015. Possibly with [place]   . CORONARY ARTERY BYPASS GRAFT  1998 &  re-do 2009   Had LIMA to DX/LAD, SVG to 2 marginal branches and [facility]; SVG to 3rd OM and PD at time of redo  . CYSTOSCOPY W/ URETERAL STENT PLACEMENT Right 12/20/2014   Procedure: CYSTOSCOPY WITH RETROGRADE PYELOGRAM/URETERAL STENT PLACEMENT;  Surgeon: PCleon Gustin MD;  [facility]  Service: Urology;  Laterality: Right;  . CYSTOSCOPY WITH RETROGRADE PYELOGRAM, URETEROSCOPY AND STENT PLACEMENT Left 08/21/2019   Procedure: CYSTOSCOPY WITH RETROGRADE PYELOGRAM, URETEROSCOPY AND STENT PLACEMENT;  Surgeon: MCleon Gustin MD;  [facility];  Service: Urology;  Laterality: Left;  1 HR  . ESOPHAGOGASTRODUODENOSCOPY     many years ago per patient   . ESOPHAGOGASTRODUODENOSCOPY ENDOSCOPY  06/17/2018  . EYE SURGERY Bilateral    cataracts  . HOLMIUM LASER APPLICATION Right 76/94/8546  Procedure:  HOLMIUM LASER LITHOTRIPSY;  Surgeon: PCleon Gustin MD;  [facility]  Service: Urology;  Laterality: Right;  . HOLMIUM LASER APPLICATION Left 32/70/3500  Procedure: HOLMIUM LASER APPLICATION;  Surgeon: MCleon Gustin MD;  [facility];  Service: Urology;  Laterality: Left;  . IR KYPHO LUMBAR INC FX REDUCE BONE BX UNI/BIL CANNULATION INC/IMAGING  02/12/2020  . IR THORACENTESIS ASP PLEURAL SPACE W/IMG GUIDE  10/03/2018  . IR THORACENTESIS ASP PLEURAL SPACE W/IMG GUIDE  06/08/2019  . LEFT HEART CATHETERIZATION WITH CORONARY/GRAFT ANGIOGRAM N/A 02/23/2013   Procedure: LEFT HEART CATHETERIZATION WITH CBeatrix Fetters  Surgeon: MBlane Ohara MD;  [facility];  Service: Cardiovascular;  Laterality: N/A;  . LOOP RECORDER INSERTION N/A 08/30/2017   Procedure: LOOP RECORDER INSERTION;  Surgeon: TEvans Lance MD;  [facility];  Service: Cardiovascular;  Laterality: N/A;  . PORTACATH PLACEMENT  Left 01/18/2016   Procedure: INSERTION PORT-A-CATH;  Surgeon: DAlphonsa Overall MD;  Location: [facility]  Service: General;  Laterality: Left;  . portacath removal    . REMOVAL OF PLEURAL DRAINAGE CATHETER Right 07/14/2019   Procedure: REMOVAL OF PLEURAL DRAINAGE CATHETER;  Surgeon: BGaye Pollack MD;  [facility]  Service: Thoracic;  Laterality: Right;  . TALC PLEURODESIS N/A 06/26/2019   Procedure: TPietro Cassis  Surgeon: BGaye Pollack MD;  [place] [facility]  Service:  Thoracic;  Laterality: N/A;  . TONSILLECTOMY    . TRANSTHORACIC ECHOCARDIOGRAM  02-24-2013      mild LVH,  ef 50-55%/  AV bioprosthesis was present with very mild stenosis and no regurg., mean grandient 25mHg, peak grandient 240mg /  mild MR/  mild LAE and RAE/  moderate TR  . TUBAL LIGATION       OB History   No obstetric history on file.     Family History  Problem Relation Age of Onset  . Heart disease Father   . Heart failure Father   . Diabetes Maternal Grandmother   . Heart disease Maternal Grandmother   . Diabetes Son   . Healthy Brother        #1  . Heart attack Brother        #2  . Heart disease Brother        #2  . Colon cancer Neg Hx   . Esophageal cancer Neg Hx     Social History   Tobacco Use  . Smoking status: Never Smoker  . Smokeless tobacco: Never Used  Vaping Use  . Vaping Use: Never used  Substance Use Topics  . Alcohol use: No  . Drug use: No    Home Medications Prior to Admission medications   Medication Sig Start Date End Date Taking? Authorizing Provider  ACCU-CHEK GUIDE test strip Use to test blood sugar 3 times daily 03/18/20   KuElayne SnareMD  acetaminophen (TYLENOL) 500 MG tablet Take 1,000 mg by mouth every 6 (six) hours as needed for moderate pain.    [provider]  albuterol (PROVENTIL) (2.5 MG/3ML) 0.083% nebulizer solution Take 3 mLs (2.5 mg total) by nebulization every 6 (six) hours as needed for wheezing or shortness of breath. 06/09/20   Parrett, TaFonnie MuNP  albuterol (VENTOLIN HFA) 108 (90 Base) MCG/ACT inhaler Inhale 2 puffs into the lungs every 6 (six) hours as needed for wheezing or shortness of breath. 10/07/19   ByCollene GobbleMD  amiodarone (PACERONE) 200 MG tablet TAKE ONE TABLET BY MOUTH DAILY Patient taking differently: Take 200 mg by mouth daily. 02/02/20   TaEvans LanceMD  apixaban (ELIQUIS) 2.5 MG TABS tablet Take 1 tablet (2.5 mg total) by mouth 2 (two) times daily. 02/09/20   GeBurtis JunesNP  azithromycin (ZITHROMAX Z-PAK) 250 MG tablet Take 2 tabs today, then 1 daily until gone. 06/06/20   ByCollene GobbleMD  Blood Glucose Monitoring Suppl (ACCU-CHEK GUIDE ME) w/Device KIT Use accu chek meter to check blood sugar three times daily. 04/10/19   KuElayne SnareMD  budesonide-formoterol ([facility]160-4.5 MCG/ACT inhaler Inhale 2 puffs into the lungs 2 (two) times daily. 10/07/19   ByCollene GobbleMD  cetirizine (ZYRTEC) 10 MG tablet Take 10 mg by mouth daily.    [provider]  Cholecalciferol (DIALYVITE VITAMIN D 5000) 125 MCG (5000 UT) capsule Take 5,000 Units by mouth daily.    [provider]  exemestane (AROMASIN) 25 MG tablet TAKE ONE TABLET BY MOUTH DAILY AFTER BREAKFAST Patient taking differently: Take 25 mg by mouth daily after breakfast. 09/23/19   GuNicholas LoseMD  ezetimibe (ZETIA) 10 MG tablet TAKE ONE TABLET BY MOUTH EVERY DAY Patient taking differently: Take 10 mg by mouth daily. 10/30/19   GeBurtis JunesNP  famotidine (PEPCID) 20 MG tablet Take 20 mg by mouth 2 (two) times daily.    [provider]  furosemide (LASIX) 20  MG tablet Take 20 mg by mouth daily as needed for fluid.     [provider]  insulin glargine (LANTUS) 100 UNIT/ML injection Inject 20 Units into the skin daily.     [provider]  Insulin Lispro (HUMALOG KWIKPEN [place]) Inject 6-8 Units into the skin 3 (three) times daily.     [provider]  Insulin Pen Needle (BD PEN NEEDLE NANO  U/F) 32G X 4 MM MISC USE AS INSTRUCTED TO INJECT INSULIN 4 TIMES DAILY. 09/16/19   Elayne Snare, MD  midodrine (PROAMATINE) 2.5 MG tablet TAKE ONE TABLET BY MOUTH THREE TIMES DAILY WITH MEALS Patient taking differently: Take 2.5 mg by mouth 3 (three) times daily with meals. 05/19/20   [name], NP  neomycin-polymyxin b-dexamethasone (MAXITROL) 3.5-10000-0.1 OINT Place 1 application into both eyes in the morning, at noon, and at bedtime. 05/27/20   [provider]  nitroGLYCERIN (NITROSTAT) 0.4 MG SL tablet Place 1 tablet (0.4 mg total) under the tongue every 5 (five) minutes as needed for chest pain. 04/04/20 07/03/20  [name], NP  ondansetron (ZOFRAN ODT) 4 MG disintegrating tablet Take 1 tablet (4 mg total) by mouth every 8 (eight) hours as needed. Patient taking differently: Take 4 mg by mouth every 8 (eight) hours as needed for nausea or vomiting. 01/20/20   Isla Pence, MD  predniSONE (DELTASONE) 10 MG tablet 49m X3 days, 224mX3 days, 1062m days, then stop. 06/06/20   ByrCollene GobbleD  rosuvastatin (CRESTOR) 40 MG tablet Take 1 tablet (40 mg total) by mouth at bedtime. 03/11/19   GerBurtis JunesP  traMADol (ULTRAM) 50 MG tablet Take 1 tablet (50 mg total) by mouth every 6 (six) hours as needed. Patient not taking: Reported on 05/30/2020 01/27/20   PetBiagio Borg PA-C  TRULICITY 1.5 MG/DE/0.8XKPN INJECT THE CONTENTS OF ONE  PEN SUBCUTANEOUSLY WEEKLY  AS DIRECTED Patient taking differently: Inject 1.5 mg into the skin once a week. Wednesday 04/22/20   KumElayne SnareD  vitamin B-12 (CYANOCOBALAMIN) 100 MCG tablet Take 100 mcg by mouth daily.    [provider]  zolpidem (AMBIEN) 5 MG tablet Take 5 mg by mouth at bedtime.    [provider]    Allergies    Amoxicillin, Tape, Aldactone [spironolactone], Arimidex [anastrozole], Latex, and Tetracycline  Review of Systems   Review of Systems  Constitutional: Negative for chills and fever.   Respiratory: Positive for cough, shortness of breath and wheezing.   Cardiovascular: Positive for leg swelling. Negative for chest pain.  All other systems reviewed and are negative.   Physical Exam Updated Vital Signs BP (!) 194/57 (BP Location: Left Arm)   Pulse 85   Temp 98.8 F (37.1 C) (Oral)   Resp (!) 21   Ht 5' 4"  (1.626 m)   Wt 63.5 kg   LMP  (LMP Unknown)   SpO2 99%   BMI 24.03 kg/m   Physical Exam Vitals and nursing note reviewed.  Constitutional:      Appearance: She is not ill-appearing or diaphoretic.  HENT:     Head: Normocephalic and atraumatic.  Eyes:     Conjunctiva/sclera: Conjunctivae normal.  Cardiovascular:     Rate and Rhythm: Normal rate and regular rhythm.     Pulses: Normal pulses.  Pulmonary:     Effort: Pulmonary effort is normal.     Breath sounds: Wheezing present. No decreased breath sounds, rhonchi or rales.  Comments: Speaking in full sentences without difficulty. Satting 100% on RA. Lungs with diffuse wheezing throughout however no rhonchi or rales.  Abdominal:     Palpations: Abdomen is soft.     Tenderness: There is no abdominal tenderness. There is no guarding or rebound.  Musculoskeletal:     Cervical back: Neck supple.     Right lower leg: Tenderness present.     Left lower leg: Tenderness present.     Comments: 1+ pitting edema bilaterally  Skin:    General: Skin is warm and dry.  Neurological:     Mental Status: She is alert.     ED Results / Procedures / Treatments   Labs (all labs ordered are listed, but only abnormal results are displayed) Labs Reviewed  BASIC METABOLIC PANEL - Abnormal; Notable for the following components:      Result Value   BUN 37 (*)    Creatinine, Ser 2.14 (*)    Calcium 8.7 (*)    GFR, Estimated 24 (*)    All other components within normal limits  CBC - Abnormal; Notable for the following components:   RBC 2.87 (*)    Hemoglobin 9.0 (*)    HCT 29.9 (*)    MCV 104.2 (*)     Platelets 130 (*)    All other components within normal limits  BRAIN NATRIURETIC PEPTIDE - Abnormal; Notable for the following components:   B Natriuretic Peptide 318.2 (*)    All other components within normal limits  SARS CORONAVIRUS 2 (TAT 6-24 HRS)    EKG EKG Interpretation  Date/Time:  Saturday June 11 2020 10:57:47 EST Ventricular Rate:  81 PR Interval:    QRS Duration: 99 QT Interval:  427 QTC Calculation: 496 R Axis:   -22 Text Interpretation: Sinus rhythm Borderline left axis deviation Abnormal R-wave progression, early transition Borderline repolarization abnormality Borderline prolonged QT interval Confirmed by Aletta Edouard [phone]) on 06/11/2020 11:00:35 AM   Radiology DG Chest Portable 1 View  Result Date: 06/11/2020 CLINICAL DATA:  Shortness of breath EXAM: PORTABLE CHEST 1 VIEW COMPARISON:  07/29/2019 FINDINGS: Prior CABG. Heart is normal size. Minimal bibasilar atelectasis. No effusions. No acute bony abnormality. IMPRESSION: Bibasilar atelectasis. Electronically Signed   By: Rolm Baptise M.D.   On: 06/11/2020 11:25    Procedures Procedures (including critical care time)  Medications Ordered in ED Medications  magnesium sulfate IVPB 2 g 50 mL (0 g Intravenous Stopped 06/11/20 1221)  albuterol (VENTOLIN HFA) 108 (90 Base) MCG/ACT inhaler 8 puff (8 puffs Inhalation Given 06/11/20 1123)  furosemide (LASIX) injection 40 mg (40 mg Intravenous Given 06/11/20 1226)    ED Course  I have reviewed the triage vital signs and the nursing notes.  Pertinent labs & imaging results that were available during my care of the patient were reviewed by me and considered in my medical decision making (see chart for details).  Clinical Course as of 06/11/20 1409  Sat Jun 11, 5754  252 75 year old female here with increased shortness of breath for a week.  She has tried some steroids with improvement.  Here she has an elevated BNP.  We will give her a trial of diuresis here and see  if her symptoms improved.  Possible discharge. [MB]    Clinical Course User Index [MB] Hayden Rasmussen, MD   MDM Rules/Calculators/A&P  75 year old female presents to the ED today with complaint of continued cough, shortness of breath, wheezing for the past 5 to 6 days.  Currently on azithromycin and prednisone at home without relief.  Has also been using nebulizer every 6 hours without relief.  Was told that if she is not feeling any better she would need to come to the ED for further evaluation.  Patient presents via EMS.  On arrival her vitals are stable.  She is afebrile, nontachycardic.  Mildly tachypneic however in the room she is satting appropriately at 100%, has no increased work of breathing on my exam.  She is speaking in full sentences without difficulty.  She does have diffuse wheezing throughout all lung fields. She is also noted to have plus pitting edema bilaterally which patient states is new.  She does have a history of CHF however has been off of Lasix for 2 to 3 months per PCP.  She is unvaccinated against Covid.  We will plan for EKG given shortness of breath, chest x-ray, lab work including CBC, BMP, BNP.  Will swab for Covid at this time however I do not feel patient requires admission, will do 6 to 24 hours test.  Will provide albuterol inhaler and magnesium to see if this does not help open patient's airway.  Her blood pressure is mildly elevated at 194/57, states she has taken her blood pressure medication today.  Will recheck after magnesium suspect this will help decrease blood pressure as well.   EKG without acute ischemic changes; does have mild prolonged Qt CXR with bibasilar atelectasis; no other findings to suggest PNA or pulmonary vascular congestion CBC without leukocytosis. Hgb stable at 9.0 BMP with stable creatinine 2.14 and BUN slightly elevated 37  Lab Results  Component Value Date   CREATININE 2.14 (H) 06/11/2020   CREATININE 2.18  (H) 05/30/2020   CREATININE 2.89 (H) 05/13/2020   BNP has returned elevated at 318.2. Will provide 40 mg IV lasix at this time. Pt on cardiac monitor given prolonged Qt; will continue to monitor  On reevaluation after Lasix pt reports improvement in symptoms. She has urinated 2-3x for [place] (has not been collected to assess for amount). BP also stable at 139/54. Will plan to discharge home at this time. Pt has an appointment with cardiology on Monday - have recommend she take 20 mg Lasix tomorrow and Monday and cardiology will need to decide whether pt needs to be on lasix long term given I suspect her symptoms are related to CHF exacerbation. Pt has 20 mg Lasix at home to take. Pt will need her creatinine checked as well. Pt is in agreement with plan and stable for discharge home.   Pt evaluated by attending physician Dr.  Melina Copa who agrees with plan.   This note was prepared using Dragon voice recognition software and may include unintentional dictation errors due to the inherent limitations of voice recognition software.  Final Clinical Impression(s) / ED Diagnoses Final diagnoses:  Acute on chronic congestive heart failure, unspecified heart failure type [facility])    Rx / DC Orders ED Discharge Orders    None       Discharge Instructions     Please follow up with your cardiologist as scheduled for Monday morning. It is recommended that you take 20 mg Lasix tomorrow and Monday to help with your exacerbation/fluid. Please discuss with cardiology whether or not to keep you on the Lasix long term.   Continue taking your other medications as  indicated.   We have tested you for COVID 19 and will call you if you test positive however you can check mychart as well.   Return to the ED for any worsening symptoms       Eustaquio Maize, PA-C 06/11/20 1410    Hayden Rasmussen, MD 06/11/20 1747

## 2020-06-11 NOTE — Telephone Encounter (Signed)
Patient called answering service, still feeling poorly Spoke with her, she is not worse.  Still using nebulizer every 6, on steroids.  No new symptoms  She will call office on Monday, if she needs seen in the office  If symptoms worsen, need to be evaluated physically in urgent care or emergency department

## 2020-06-11 NOTE — Discharge Instructions (Addendum)
Please follow up with your cardiologist as scheduled for Monday morning. It is recommended that you take 20 mg Lasix tomorrow and Monday to help with your exacerbation/fluid. Please discuss with cardiology whether or not to keep you on the Lasix long term.   Continue taking your other medications as indicated.   We have tested you for COVID 19 and will call you if you test positive however you can check mychart as well.   Return to the ED for any worsening symptoms

## 2020-06-12 IMAGING — DX CHEST - 2 VIEW
2 series · 2 of 2 positions shown · non-contrast
Comparison: 05/21/2018

CLINICAL DATA: Dyspnea on exertion

EXAM:
CHEST - 2 VIEW

[chest pa]
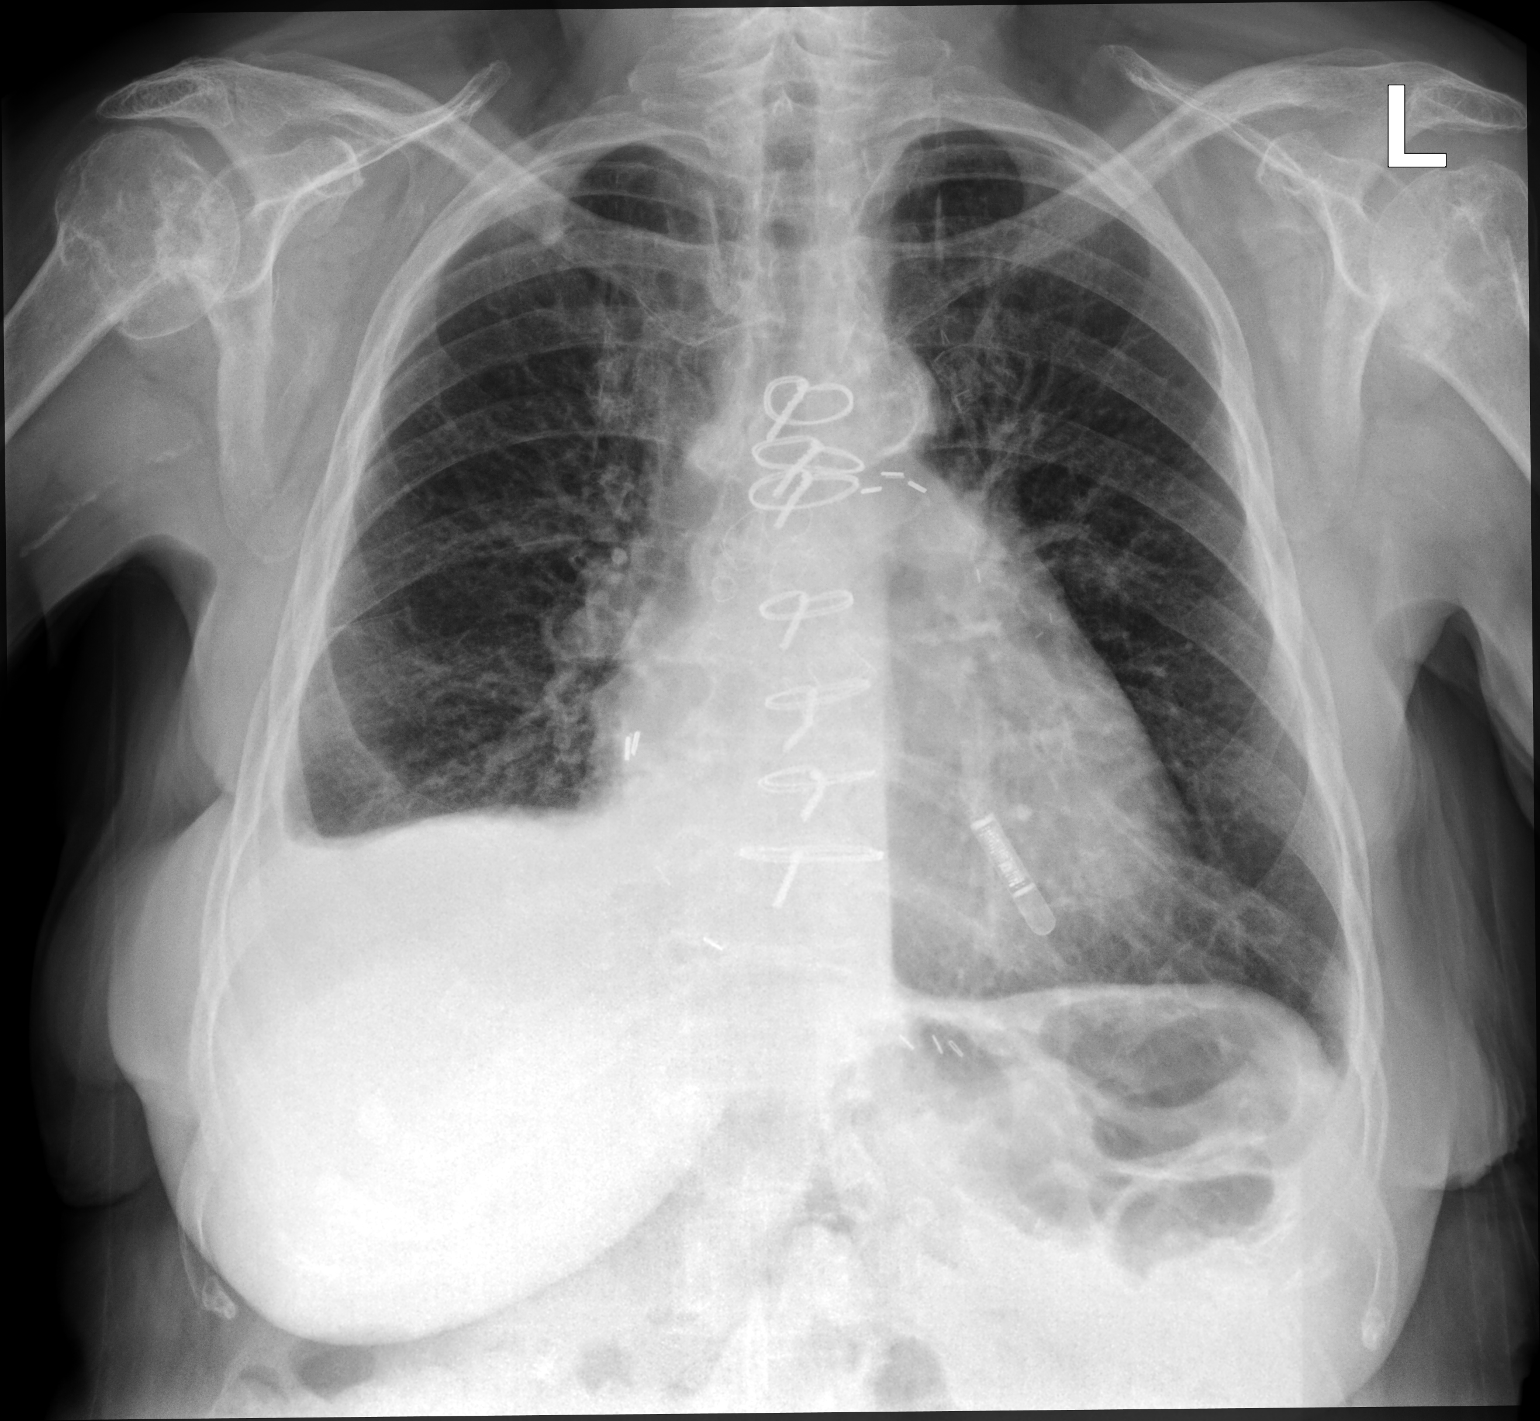

[chest lat]
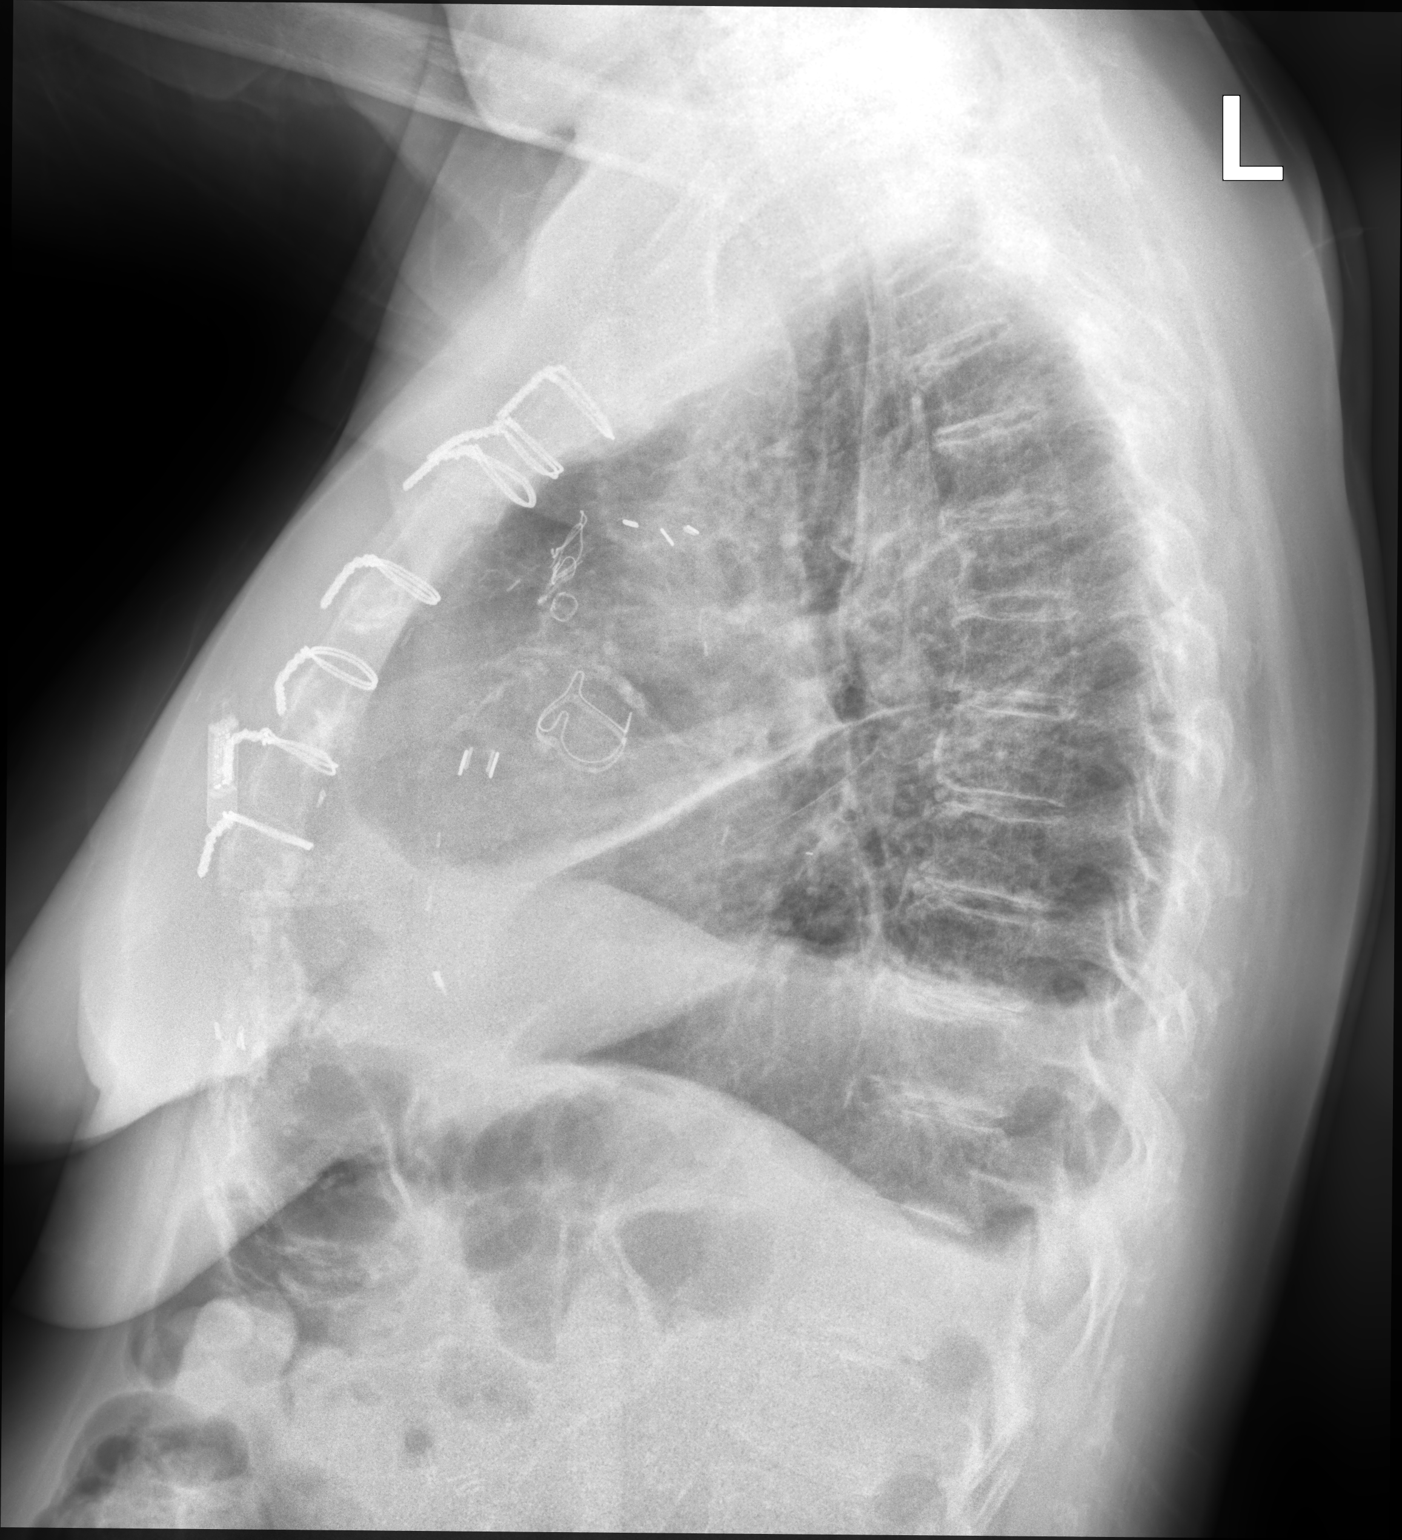

[2 of 2 positions shown; findings below may reference images not displayed]

FINDINGS: Borderline cardiomegaly. Pulmonary vascularity is within normal
limits. A moderate right pleural effusion has developed. Left lung
is clear. No pneumothorax.
IMPRESSION: New moderate right pleural effusion has developed.

## 2020-06-13 ENCOUNTER — Other Ambulatory Visit: Payer: Self-pay

## 2020-06-13 ENCOUNTER — Encounter: Payer: Self-pay | Admitting: Nurse Practitioner

## 2020-06-13 ENCOUNTER — Ambulatory Visit (INDEPENDENT_AMBULATORY_CARE_PROVIDER_SITE_OTHER): Payer: Medicare Other | Admitting: Nurse Practitioner

## 2020-06-13 ENCOUNTER — Other Ambulatory Visit: Payer: Self-pay | Admitting: Nurse Practitioner

## 2020-06-13 VITALS — BP 92/60 | HR 77 | Ht 64.0 in | Wt 133.4 lb

## 2020-06-13 DIAGNOSIS — I5032 Chronic diastolic (congestive) heart failure: Secondary | ICD-10-CM | POA: Diagnosis not present

## 2020-06-13 DIAGNOSIS — Z79899 Other long term (current) drug therapy: Secondary | ICD-10-CM | POA: Diagnosis not present

## 2020-06-13 DIAGNOSIS — I2510 Atherosclerotic heart disease of native coronary artery without angina pectoris: Secondary | ICD-10-CM | POA: Diagnosis not present

## 2020-06-13 DIAGNOSIS — R55 Syncope and collapse: Secondary | ICD-10-CM

## 2020-06-13 NOTE — Patient Instructions (Addendum)
After Visit Summary:  We will be checking the following labs today - BMET, CBC and LFTs   Medication Instructions:    Continue with your current medicines.    If you need a refill on your cardiac medications before your next appointment, please call your pharmacy.     Testing/Procedures To Be Arranged:  N/A  Follow-Up:   See Dr. Aundra Dubin tomorrow.     At [facility], you and your health needs are our priority.  As part of our continuing mission to provide you with exceptional heart care, we have created designated Provider Care Teams.  These Care Teams include your primary Cardiologist (physician) and Advanced Practice Providers (APPs -  Physician Assistants and Nurse Practitioners) who all work together to provide you with the care you need, when you need it.  Special Instructions:  . Stay safe, wash your hands for at least 20 seconds and wear a mask when needed.  . It was good to talk with you today.    Call the [place] office at [phone] if you have any questions, problems or concerns.

## 2020-06-14 ENCOUNTER — Ambulatory Visit (HOSPITAL_COMMUNITY)
Admission: RE | Admit: 2020-06-14 | Discharge: 2020-06-14 | Disposition: A | Discharge status: Home / Self Care | Payer: Medicare Other | Source: Ambulatory Visit | Attending: Cardiology | Admitting: Cardiology

## 2020-06-14 ENCOUNTER — Encounter (HOSPITAL_COMMUNITY): Payer: Self-pay | Admitting: Cardiology

## 2020-06-14 VITALS — BP 170/58 | HR 78 | Ht 64.0 in | Wt 131.2 lb

## 2020-06-14 DIAGNOSIS — J45909 Unspecified asthma, uncomplicated: Secondary | ICD-10-CM | POA: Diagnosis not present

## 2020-06-14 DIAGNOSIS — I130 Hypertensive heart and chronic kidney disease with heart failure and stage 1 through stage 4 chronic kidney disease, or unspecified chronic kidney disease: Secondary | ICD-10-CM | POA: Insufficient documentation

## 2020-06-14 DIAGNOSIS — Z951 Presence of aortocoronary bypass graft: Secondary | ICD-10-CM | POA: Insufficient documentation

## 2020-06-14 DIAGNOSIS — R55 Syncope and collapse: Secondary | ICD-10-CM | POA: Diagnosis not present

## 2020-06-14 DIAGNOSIS — Z7951 Long term (current) use of inhaled steroids: Secondary | ICD-10-CM | POA: Diagnosis not present

## 2020-06-14 DIAGNOSIS — I5032 Chronic diastolic (congestive) heart failure: Secondary | ICD-10-CM | POA: Diagnosis not present

## 2020-06-14 DIAGNOSIS — Z79899 Other long term (current) drug therapy: Secondary | ICD-10-CM | POA: Insufficient documentation

## 2020-06-14 DIAGNOSIS — Z953 Presence of xenogenic heart valve: Secondary | ICD-10-CM | POA: Diagnosis not present

## 2020-06-14 DIAGNOSIS — Z794 Long term (current) use of insulin: Secondary | ICD-10-CM | POA: Insufficient documentation

## 2020-06-14 DIAGNOSIS — E1122 Type 2 diabetes mellitus with diabetic chronic kidney disease: Secondary | ICD-10-CM | POA: Insufficient documentation

## 2020-06-14 DIAGNOSIS — N183 Chronic kidney disease, stage 3 unspecified: Secondary | ICD-10-CM | POA: Diagnosis not present

## 2020-06-14 DIAGNOSIS — E859 Amyloidosis, unspecified: Secondary | ICD-10-CM | POA: Diagnosis not present

## 2020-06-14 DIAGNOSIS — E669 Obesity, unspecified: Secondary | ICD-10-CM | POA: Insufficient documentation

## 2020-06-14 DIAGNOSIS — E785 Hyperlipidemia, unspecified: Secondary | ICD-10-CM | POA: Diagnosis not present

## 2020-06-14 DIAGNOSIS — Z952 Presence of prosthetic heart valve: Secondary | ICD-10-CM

## 2020-06-14 DIAGNOSIS — I2510 Atherosclerotic heart disease of native coronary artery without angina pectoris: Secondary | ICD-10-CM | POA: Diagnosis not present

## 2020-06-14 DIAGNOSIS — Z8673 Personal history of transient ischemic attack (TIA), and cerebral infarction without residual deficits: Secondary | ICD-10-CM | POA: Insufficient documentation

## 2020-06-14 DIAGNOSIS — I480 Paroxysmal atrial fibrillation: Secondary | ICD-10-CM | POA: Diagnosis not present

## 2020-06-14 DIAGNOSIS — Z7901 Long term (current) use of anticoagulants: Secondary | ICD-10-CM | POA: Insufficient documentation

## 2020-06-14 LAB — COMPREHENSIVE METABOLIC PANEL
ALT: 47 U/L — ABNORMAL HIGH (ref 0–44)
AST: 39 U/L (ref 15–41)
Albumin: 2.8 g/dL — ABNORMAL LOW (ref 3.5–5.0)
Alkaline Phosphatase: 57 U/L (ref 38–126)
Anion gap: 10 (ref 5–15)
BUN: 38 mg/dL — ABNORMAL HIGH (ref 8–23)
CO2: 27 mmol/L (ref 22–32)
Calcium: 9.2 mg/dL (ref 8.9–10.3)
Chloride: 100 mmol/L (ref 98–111)
Creatinine, Ser: 2.2 mg/dL — ABNORMAL HIGH (ref 0.44–1.00)
GFR, Estimated: 23 mL/min — ABNORMAL LOW (ref >60)
Glucose, Bld: 78 mg/dL (ref 70–99)
Potassium: 3.7 mmol/L (ref 3.5–5.1)
Sodium: 137 mmol/L (ref 135–145)
Total Bilirubin: 0.8 mg/dL (ref 0.3–1.2)
Total Protein: 6.9 g/dL (ref 6.5–8.1)

## 2020-06-14 LAB — BASIC METABOLIC PANEL
BUN/Creatinine Ratio: 16 (ref 12–28)
BUN: 32 mg/dL — ABNORMAL HIGH (ref 8–27)
CO2: 24 mmol/L (ref 20–29)
Calcium: 9.6 mg/dL (ref 8.7–10.3)
Chloride: 102 mmol/L (ref 96–106)
Creatinine, Ser: 2.03 mg/dL — ABNORMAL HIGH (ref 0.57–1.00)
GFR calc Af Amer: 27 mL/min/{1.73_m2} — ABNORMAL LOW (ref >59)
GFR calc non Af Amer: 24 mL/min/{1.73_m2} — ABNORMAL LOW (ref >59)
Glucose: 154 mg/dL — ABNORMAL HIGH (ref 65–99)
Potassium: 4.3 mmol/L (ref 3.5–5.2)
Sodium: 142 mmol/L (ref 134–144)

## 2020-06-14 LAB — HEPATIC FUNCTION PANEL
ALT: 52 IU/L — ABNORMAL HIGH (ref 0–32)
AST: 49 IU/L — ABNORMAL HIGH (ref 0–40)
Albumin: 3.5 g/dL — ABNORMAL LOW (ref 3.7–4.7)
Alkaline Phosphatase: 72 IU/L (ref 44–121)
Bilirubin Total: 0.5 mg/dL (ref 0.0–1.2)
Bilirubin, Direct: 0.18 mg/dL (ref 0.00–0.40)
Total Protein: 6.6 g/dL (ref 6.0–8.5)

## 2020-06-14 LAB — CBC
Hematocrit: 31.3 % — ABNORMAL LOW (ref 34.0–46.6)
Hemoglobin: 9.7 g/dL — ABNORMAL LOW (ref 11.1–15.9)
MCH: 30.3 pg (ref 26.6–33.0)
MCHC: 31 g/dL — ABNORMAL LOW (ref 31.5–35.7)
MCV: 98 fL — ABNORMAL HIGH (ref 79–97)
Platelets: 189 10*3/uL (ref 150–450)
RBC: 3.2 x10E6/uL — ABNORMAL LOW (ref 3.77–5.28)
RDW: 12.2 % (ref 11.7–15.4)
WBC: 8.5 10*3/uL (ref 3.4–10.8)

## 2020-06-14 LAB — TSH: TSH: 2.006 u[IU]/mL (ref 0.350–4.500)

## 2020-06-14 MED ORDER — METOPROLOL SUCCINATE ER 25 MG PO TB24: 12.5000 mg | ORAL_TABLET | Freq: Every day | ORAL | 3 refills | Status: DC

## 2020-06-14 MED ORDER — FUROSEMIDE 20 MG PO TABS: 20.0000 mg | ORAL_TABLET | Freq: Every day | ORAL | 3 refills | Status: DC

## 2020-06-14 NOTE — Patient Instructions (Addendum)
START Toprol XL 12.5mg  (1/2 tablet) daily  Keep Lasix 20mg  (1 tablet) Daily  Labs done today, your results will be available in MyChart, we will contact you for abnormal readings.  You have been ordered a PYP Scan.  This is done in the Radiology Department of [facility].  When you come for this test please plan to be there 2-3 hours. ONCE WE APPROVE WITH INSURANCE, WE WILL CONTACT YOU  Your physician recommends that you schedule a follow-up appointment in: 6 weeks  If you have any questions or concerns before your next appointment please send [place] a message through [place] or call our office at [phone].    TO LEAVE A MESSAGE FOR THE NURSE SELECT OPTION 2, PLEASE LEAVE A MESSAGE INCLUDING: . YOUR NAME . DATE OF BIRTH . CALL BACK NUMBER . REASON FOR CALL**this is important as we prioritize the call backs  YOU WILL RECEIVE A CALL BACK THE SAME DAY AS LONG AS YOU CALL BEFORE 4:00 PM

## 2020-06-14 NOTE — Progress Notes (Signed)
Patient ID: Hannah [name], female   DOB: Jan 08, 1946, 75 y.o.   MRN: [id number] PCP: Dr. Suzy Bouchard Cardiology: Dr. Aundra Dubin  75 y.o. with history of CAD s/p CABG, bioprosthetic AVR, CKD, paroxysmal atrial fibrillation presents for cardiology followup.  She had her initial CABG in 1998, then had redo CABG in 2009 with bioprosthetic AVR.    In 9/14, she developed tachypalpitations and later chest pain.  In the ER, SBP was in the 200s with elevated HR. Troponin was mildly elevated.  Therefore, she had a cardiac cath showing patent grafts.  She was thought to have had demand ischemia in the setting of hypertensive emergency + tachycardia.  After discharge, she continued to have runs of tachypalpitations.  A 3 week event monitor was put on her.  This showed runs, sometimes long, of atrial tachycardia. She was started on Toprol XL when these runs were noted.  No further palpitations (complete resolution of symptoms).    In 1/15, she was admitted to [facility] with atypical chest pain.  She was ruled out for MI and Lexiscan Cardiolite showed no ischemia.  She was started on Imdur and sent home.  After discharge, she developed a daily headache and nausea.  She did not eat or drink much due to the nausea. Shortly after this, she was standing in her grandson's pediatrician's office and became severely lightheaded.  She had to sit down.  She came to the ER and was found to be orthostatic.  She was given IV fluid and got better quickly.  I had her stop Imdur and symptoms seem to have completely resolved.    Event monitor in 8/16 showed atrial tachycardia versus atypical atrial flutter.  Toprol XL was increased.  She saw Dr Lovena Le who thought that she had short runs of atrial tachycardia and not atrial flutter.  Subsequently, she has been found to have paroxysmal atrial fibrillation and is on Eliquis.     Last echo in 11/21 showed EF 55-60%, mild LVH, normal RV, normal bioprosthetic aortic valve.  Cardiolite in 11/21 showed  no ischemia/infarction.   She has problems with anemia and has had multiple transfusions.  However, it has been a number of months since she required transfusion. She is getting Aranesp.   She has had significant problems with orthostatic intolerance and syncope.  She is taking midodrine now.  Last syncopal episode was in 12/21.  She has a loop recorder which has shown no explanation for syncope.   She was referred back to [facility] due to multiple cardiac problems.  She is actually doing fairly well, thinks that midodrine has helped.  No syncope since 05/21/20.  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 No events on ILR.  Currently, not getting lightheaded with standing.  Today, BP is elevated but she has an orthostatic BP drop with standing.  She has been taking Lasix 20 mg daily and feels like it keep fluid off her.  She still has episodes of central chest tightness.  This occurs a couple of times a week, not exertional. Beta blockers have resolved this pain in the past but she is off her beta blocker due to syncopal episodes. Generally, no dyspnea walking around the house or walking short distances.  Dyspnea only with stairs, long distances.  She currently has bronchitis and is on prednisone and antibiotics from pulmonary.   ECG: NSR, LAFB, poor RWP (personally reviewed)  Labs (2/13): BNP 80, K  5.3, creatinine 1.1 Labs (6/13): LDL 76, HDL 41 Labs (8/13): K 4.3, creatinine 1.38 Labs (9/13): creatinine 1.6, proBNP 128 Labs (9/14): K 4.7, creatinine 1.5, LDL 82, HDL 35 Labs (10/14): K 4.8, creatinine 1.5 Labs (1/15): LDL 105, HDL 37 Labs (2/15): K 4.1, creatinine 1.28, HCT 34 Labs (4/15): K 4.2, creatinine 1.5 => 1.4, BNP 95 Labs (1/16): K 3.8, creatinine 1.6, Hgb 10.5 Labs (7/16): K 3.7, creatinine 1.77, LDL 49, HDL 27 Labs (1/17): LDL 58, HDL 38, HCT 35.4 Labs (4/17): K 4.5, creatinine 1.47 Labs (1/22): K 4.3,  creatinine 2.03, hgb 9.7, BNP 318  PMH: 1. Diabetes mellitus 2. HTN 3. Obesity 4. Atrial arrhythmias: Atrial tachycardia and paroxysmal atrial fibrillation.    5. CAD: CABG 1998 with LIMA-LAD/diagonal, sequential SVG-OM1/OM2, SVG-RCA.  Redo CABG 2009 with SVG-OM3, SVG-PDA. Lexiscan myoview (9/13): EF 61%, no ischemia or infarction.  LHC (10/14) with all grafts patent except the old SVG-RCA.  There was 90% stenosis in the AV LCx before ungrafted MOM, but this was unchanged from prior study.  Lexiscan Cardiolite (1/15) with no ischemia. She does not tolerate Imdur.  - Cardiolite (11/21): EF 61%, no ischemia/infarction.  6. CKD 7. Bioprosthetic aortic valve: Echo (2011) with EF 50-55%, bioprosthetic aortic valve well-seated.  Echo (2/13) with EF 60-65%, bioprosthetic aortic valve looked ok.  Echo (9/14): EF 50-55%, mild LVH, bioprosthetic AVR with mean gradient 10, mild MR, moderate TR.  - Echo in 11/21 with stable bioprosthetic aortic valve.  8. Asthma: since her teens. 9. Low back pain.  10. 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  Per neurologist, did not appear cardioembolic.   11. Carotid stenosis: 40-59% bilateral ICA stenosis on 10/14 carotid dopplers. Carotid doppler (11/15) with 40-59% bilateral ICA stenosis.  Carotid dopplers (11/16) with 40-59% BICA stenosis.  - Carotid dopplers (8/21): 1-39% BICA stenosis.  12. Sleep study 12/14 without significant OSA.  13. Chronic diastolic CHF - Echo (11/21): EF 55-60%, mild LVH, RV normal, bioprosthetic aortic valve with mean gradient 8 mmHg and no AI.  14. Breast cancer: Diagnosed 2017.  She had chemotherapy and right lumpectomy.  15. Anemia of renal disease  SH: Never smoked.  Lives in [place].  Married.    FH: No premature CAD.   ROS: All systems reviewed and negative except as per HPI.    Current Outpatient Medications  Medication Sig Dispense Refill  . ACCU-CHEK GUIDE test strip Use to test blood sugar 3 times daily 150 strip  3  . acetaminophen (TYLENOL) 500 MG tablet Take 1,000 mg by mouth as needed for moderate pain.    . albuterol (PROVENTIL) (2.5 MG/3ML) 0.083% nebulizer solution Take 3 mLs (2.5 mg total) by nebulization every 6 (six) hours as needed for wheezing or shortness of breath. 75 mL 3  . albuterol (VENTOLIN HFA) 108 (90 Base) MCG/ACT inhaler Inhale 2 puffs into the lungs every 6 (six) hours as needed for wheezing or shortness of breath. 18 g 5  . amiodarone (PACERONE) 200 MG tablet TAKE ONE TABLET BY MOUTH DAILY (Patient taking differently: Take 200 mg by mouth daily.) 90 tablet 3  . apixaban (ELIQUIS) 2.5 MG TABS tablet Take 1 tablet (2.5 mg total) by mouth 2 (two) times daily. 180 tablet 1  . Blood Glucose Monitoring Suppl (ACCU-CHEK GUIDE ME) w/Device KIT Use accu chek meter to check blood sugar three times daily. 1 kit 0  . budesonide-formoterol (SYMBICORT) 160-4.5 MCG/ACT inhaler Inhale 2 puffs into the lungs 2 (two)   times daily. 1 Inhaler 5  . cetirizine (ZYRTEC) 10 MG tablet Take 10 mg by mouth daily.    . Cholecalciferol (DIALYVITE VITAMIN D 5000) 125 MCG (5000 UT) capsule Take 5,000 Units by mouth daily.    . exemestane (AROMASIN) 25 MG tablet TAKE ONE TABLET BY MOUTH DAILY AFTER BREAKFAST (Patient taking differently: Take 25 mg by mouth daily after breakfast.) 90 tablet 3  . ezetimibe (ZETIA) 10 MG tablet TAKE ONE TABLET BY MOUTH EVERY DAY (Patient taking differently: Take 10 mg by mouth daily.) 90 tablet 3  . famotidine (PEPCID) 20 MG tablet Take 20 mg by mouth 2 (two) times daily.    . insulin glargine (LANTUS) 100 UNIT/ML injection Inject 20 Units into the skin daily.     . Insulin Lispro (HUMALOG KWIKPEN [place]) Inject 6-8 Units into the skin 3 (three) times daily.     . Insulin Pen Needle (BD PEN NEEDLE NANO U/F) 32G X 4 MM MISC USE AS INSTRUCTED TO INJECT INSULIN 4 TIMES DAILY. 360 each 1  . metoprolol succinate (TOPROL XL) 25 MG 24 hr tablet Take 0.5 tablets (12.5 mg total) by mouth daily. 45  tablet 3  . midodrine (PROAMATINE) 2.5 MG tablet TAKE ONE TABLET BY MOUTH THREE TIMES DAILY WITH MEALS (Patient taking differently: Take 2.5 mg by mouth 3 (three) times daily with meals.) 270 tablet 3  . nitroGLYCERIN (NITROSTAT) 0.4 MG SL tablet Place 1 tablet (0.4 mg total) under the tongue every 5 (five) minutes as needed for chest pain. 25 tablet 3  . ondansetron (ZOFRAN ODT) 4 MG disintegrating tablet Take 1 tablet (4 mg total) by mouth every 8 (eight) hours as needed. (Patient taking differently: Take 4 mg by mouth every 8 (eight) hours as needed for nausea or vomiting.) 10 tablet 0  . predniSONE (DELTASONE) 10 MG tablet 30mg X3 days, 20mg X3 days, 10mgX3 days, then stop. 18 tablet 0  . rosuvastatin (CRESTOR) 40 MG tablet Take 1 tablet (40 mg total) by mouth at bedtime. 90 tablet 3  . TRULICITY 1.5 MG/0.5ML SOPN INJECT THE CONTENTS OF ONE  PEN SUBCUTANEOUSLY WEEKLY  AS DIRECTED (Patient taking differently: Inject 1.5 mg into the skin once a week. Wednesday) 6 mL 3  . vitamin B-12 (CYANOCOBALAMIN) 100 MCG tablet Take 100 mcg by mouth daily.    . zolpidem (AMBIEN) 5 MG tablet Take 5 mg by mouth at bedtime.    . furosemide (LASIX) 20 MG tablet Take 1 tablet (20 mg total) by mouth daily. 30 tablet 3   No current facility-administered medications for this encounter.    BP (!) 170/58   Pulse 78   Ht 5' 4" (1.626 m)   Wt 59.5 kg (131 lb 3.2 oz)   LMP  (LMP Unknown)   SpO2 97%   BMI 22.52 kg/m  General: NAD Neck: No JVD, no thyromegaly or thyroid nodule.  Lungs: Clear to auscultation bilaterally with normal respiratory effort. CV: Nondisplaced PMI.  Heart regular S1/S2, no S3/S4, 2/6 early SEM RUSB.  No peripheral edema.  No carotid bruit.  Normal pedal pulses.  Abdomen: Soft, nontender, no hepatosplenomegaly, no distention.  Skin: Intact without lesions or rashes.  Neurologic: Alert and oriented x 3.  Psych: Normal affect. Extremities: No clubbing or cyanosis.  HEENT: Normal.    Assessment/Plan: 1. CAD: Admission in 9/14 with mild troponin elevation.  LHC showed stable anatomy with patent grafts except for SVG-RCA from older CABG.  Suspect that the troponin elevation was due to   demand ischemia in the setting of hypertensive emergency and tachycardia (possible run of atrial tachycardia).  Admission in 1/15 with atypical chest pain, had Lexiscan Cardiolite that showed no ischemia.  Cardiolite in 11/21 with no ischemia/infarction.  She has ongoing episodes of atypical chest pain, resolved in the past with beta blocker. Given normal Cardiolite and atypical nature of pain, possible microvascular angina.  - No ASA given Eliquis use.  - Continue Crestor 40 mg daily.   - I will start her on Toprol XL 25 mg qhs to see if this helps with chest pain.       2. Atrial fibrillation: She is on amiodarone gtt and apixaban (dose reduction of apixaban given renal function and weight).  - Check LFTs, TSH.  She will need a regular eye exam with amiodarone.   3. HYPERLIPIDEMIA: Will need to check lipids with Crestor and Zetia.  4. Bioprosthetic aortic valve: Stable on 11/21 echo.   5. Chronic diastolic CHF: Echo in 11/21 with EF 55-60% with mild LVH.  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 She looks euvolemic today, NYHA class III.  - I think that it would be worthwhile working her up for cardiac amyloidosis given CAD, renal failure, and neuropathy.  I will check urine and serum immunofixation as well as serum free light chain ratio.  She will get a PYP scan to assess for TTR amyloidosis.  - Continue Lasix 20 mg daily.  BMET today.  6. CKD: Stage III.  - BMET today.  7. Syncope/presyncope: No events have been found on ILR.  ?Orthostatic intolerance due to autonomic neuoropathy.  - As above, amyloidosis workup.  - Continue midodrine 2.5 mg tid.  She is still orthostatic but minimally symptomatic.   Followup  in 6 wks  Dalton McLean 06/14/2020   

## 2020-06-15 LAB — IMMUNOFIXATION, URINE

## 2020-06-16 ENCOUNTER — Other Ambulatory Visit: Payer: Self-pay | Admitting: *Deleted

## 2020-06-16 ENCOUNTER — Telehealth: Payer: Self-pay | Admitting: Endocrinology

## 2020-06-16 LAB — MULTIPLE MYELOMA PANEL, SERUM
Albumin SerPl Elph-Mcnc: 3.1 g/dL (ref 2.9–4.4)
Albumin/Glob SerPl: 0.9 (ref 0.7–1.7)
Alpha 1: 0.3 g/dL (ref 0.0–0.4)
Alpha2 Glob SerPl Elph-Mcnc: 1 g/dL (ref 0.4–1.0)
B-Globulin SerPl Elph-Mcnc: 1.1 g/dL (ref 0.7–1.3)
Gamma Glob SerPl Elph-Mcnc: 1.1 g/dL (ref 0.4–1.8)
Globulin, Total: 3.5 g/dL (ref 2.2–3.9)
IgA: 562 mg/dL — ABNORMAL HIGH (ref 64–422)
IgG (Immunoglobin G), Serum: 1169 mg/dL (ref 586–1602)
IgM (Immunoglobulin M), Srm: 74 mg/dL (ref 26–217)
Total Protein ELP: 6.6 g/dL (ref 6.0–8.5)

## 2020-06-16 MED ORDER — ACCU-CHEK FASTCLIX LANCETS MISC: 5 refills | Status: AC

## 2020-06-16 NOTE — Progress Notes (Signed)
Patient Care Team: [name], DO as PCP - General [name], NP as PCP - Cardiology (Nurse Practitioner) Evans Lance, MD as PCP - Electrophysiology (Cardiology) Alphonsa Overall, MD as Consulting Physician (General Surgery) Nicholas Lose, MD as Consulting Physician (Hematology and Oncology) Gery Pray, MD as Consulting Physician (Radiation Oncology) Evans Lance, MD as Consulting Physician (Cardiology) Collene Gobble, MD as Consulting Physician (Pulmonary Disease) Elayne Snare, MD as Consulting Physician (Endocrinology) Delice Bison, Charlestine Massed, NP as Nurse Practitioner (Hematology and Oncology) Ardelle Balls., MD (Neurology) Izora Gala, MD as Consulting Physician (Otolaryngology) Trula Slade, DPM as Consulting Physician (Podiatry) Cameron Sprang, MD as Consulting Physician (Neurology)  DIAGNOSIS:    ICD-10-CM   1. Anemia of chronic disease  D63.8     SUMMARY OF ONCOLOGIC HISTORY: Oncology History  Breast cancer of upper-inner quadrant of right female breast ([place])  12/14/2015 Initial Diagnosis   Right breast biopsy 12:30 position: 2 masses, 1.6 cm mass: Invasive ductal carcinoma, grade 2, ER 0%, PR 0%, HER-2 negative, Ki-67 70%; satellite mass 8 mm: IDC grade 2 ER 5%, PR 5%, HER-2 negative, Ki-67 50%; T1cN0 stage IA clinical stage   01/18/2016 Surgery   Right lumpectomy: Multifocal IDC grade 3, 1.9 cm  ER 0%, PR 0%, HER-2 negative, Ki-67 70% and 0.8 cm satellite mass ER 5%, PR 2%, HER-2 negative, Ki-67 50%, high-grade DCIS, margins negative, 0/1 lymph nodes negative T1 cN0 stage IA   02/17/2016 - 04/06/2016 Chemotherapy   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    04/08/2016 - 04/23/2016 Hospital Admission   Hosp adm for cellulitis   05/24/2016 - 07/25/2016 Radiation Therapy    Adj XRT   06/29/2016 - 07/04/2016 Hospital Admission   Seizure like activity, MRI brain and EEG unremarkable    08/16/2016 -   Anti-estrogen oral therapy   Letrozole could not tolerate it due to dizziness and lightheadedness, switched to anastrozole 04/04/2017 switched to exemestane 05/22/2017     CHIEF COMPLIANT: Follow-up of breast cancer and severe anemia  INTERVAL HISTORY: Hannah [name] is a 75 y.o. with above-mentioned history of breast cancer and severe anemia. She presents to the clinic today for follow-up.   She was recently hospitalized in December for what appears to be seizures.  She has done well since that time and has not had any further problems since.  They thought it was related to low blood pressure issues.  She is extremely fatigued.  She has a hard time doing her ADLs.  ALLERGIES:  is allergic to amoxicillin, tape, aldactone [spironolactone], arimidex [anastrozole], latex, and tetracycline.  MEDICATIONS:  Current Outpatient Medications  Medication Sig Dispense Refill  . Accu-Chek FastClix Lancets MISC Use to check blood sugar 3 times per day 100 each 5  . ACCU-CHEK GUIDE test strip Use to test blood sugar 3 times daily 150 strip 3  . acetaminophen (TYLENOL) 500 MG tablet Take 1,000 mg by mouth as needed for moderate pain.    Marland Kitchen albuterol (PROVENTIL) (2.5 MG/3ML) 0.083% nebulizer solution Take 3 mLs (2.5 mg total) by nebulization every 6 (six) hours as needed for wheezing or shortness of breath. 75 mL 3  . albuterol (VENTOLIN HFA) 108 (90 Base) MCG/ACT inhaler Inhale 2 puffs into the lungs every 6 (six) hours as needed for wheezing or shortness of breath. 18 g 5  . amiodarone (PACERONE) 200 MG tablet TAKE ONE TABLET BY MOUTH DAILY (Patient taking differently: Take 200  mg by mouth daily.) 90 tablet 3  . apixaban (ELIQUIS) 2.5 MG TABS tablet Take 1 tablet (2.5 mg total) by mouth 2 (two) times daily. 180 tablet 1  . Blood Glucose Monitoring Suppl (ACCU-CHEK GUIDE ME) w/Device KIT Use accu chek meter to check blood sugar three times daily. 1 kit 0  . budesonide-formoterol (SYMBICORT) 160-4.5 MCG/ACT  inhaler Inhale 2 puffs into the lungs 2 (two) times daily. 1 Inhaler 5  . cetirizine (ZYRTEC) 10 MG tablet Take 10 mg by mouth daily.    . Cholecalciferol (DIALYVITE VITAMIN D 5000) 125 MCG (5000 UT) capsule Take 5,000 Units by mouth daily.    Marland Kitchen exemestane (AROMASIN) 25 MG tablet TAKE ONE TABLET BY MOUTH DAILY AFTER BREAKFAST (Patient taking differently: Take 25 mg by mouth daily after breakfast.) 90 tablet 3  . ezetimibe (ZETIA) 10 MG tablet TAKE ONE TABLET BY MOUTH EVERY DAY (Patient taking differently: Take 10 mg by mouth daily.) 90 tablet 3  . famotidine (PEPCID) 20 MG tablet Take 20 mg by mouth 2 (two) times daily.    . furosemide (LASIX) 20 MG tablet Take 1 tablet (20 mg total) by mouth daily. 30 tablet 3  . insulin glargine (LANTUS) 100 UNIT/ML injection Inject 20 Units into the skin daily.     . Insulin Lispro (HUMALOG KWIKPEN [place]) Inject 6-8 Units into the skin 3 (three) times daily.     . Insulin Pen Needle (BD PEN NEEDLE NANO U/F) 32G X 4 MM MISC USE AS INSTRUCTED TO INJECT INSULIN 4 TIMES DAILY. 360 each 1  . metoprolol succinate (TOPROL XL) 25 MG 24 hr tablet Take 0.5 tablets (12.5 mg total) by mouth daily. 45 tablet 3  . midodrine (PROAMATINE) 2.5 MG tablet TAKE ONE TABLET BY MOUTH THREE TIMES DAILY WITH MEALS (Patient taking differently: Take 2.5 mg by mouth 3 (three) times daily with meals.) 270 tablet 3  . nitroGLYCERIN (NITROSTAT) 0.4 MG SL tablet Place 1 tablet (0.4 mg total) under the tongue every 5 (five) minutes as needed for chest pain. 25 tablet 3  . ondansetron (ZOFRAN ODT) 4 MG disintegrating tablet Take 1 tablet (4 mg total) by mouth every 8 (eight) hours as needed. (Patient taking differently: Take 4 mg by mouth every 8 (eight) hours as needed for nausea or vomiting.) 10 tablet 0  . predniSONE (DELTASONE) 10 MG tablet 72m X3 days, 263mX3 days, 1068m days, then stop. 18 tablet 0  . rosuvastatin (CRESTOR) 40 MG tablet Take 1 tablet (40 mg total) by mouth at bedtime. 90  tablet 3  . TRULICITY 1.5 MG/HC/6.2BJPN INJECT THE CONTENTS OF ONE  PEN SUBCUTANEOUSLY WEEKLY  AS DIRECTED (Patient taking differently: Inject 1.5 mg into the skin once a week. Wednesday) 6 mL 3  . vitamin B-12 (CYANOCOBALAMIN) 100 MCG tablet Take 100 mcg by mouth daily.    . zMarland Kitchenlpidem (AMBIEN) 5 MG tablet Take 5 mg by mouth at bedtime.     No current facility-administered medications for this visit.    PHYSICAL EXAMINATION: ECOG PERFORMANCE STATUS: 3 - Symptomatic, >50% confined to bed  Vitals:   06/17/20 0950  BP: (!) 105/40  Pulse: 78  Resp: 17  Temp: 97.6 F (36.4 C)  SpO2: 99%   Filed Weights   06/17/20 0950  Weight: 129 lb 12.8 oz (58.9 kg)    LABORATORY DATA:  I have reviewed the data as listed CMP Latest Ref Rng & Units 06/14/2020 06/13/2020 06/11/2020  Glucose 70 - 99 mg/dL 78 154(H)  98  BUN 8 - 23 mg/dL 38(H) 32(H) 37(H)  Creatinine 0.44 - 1.00 mg/dL 2.20(H) 2.03(H) 2.14(H)  Sodium 135 - 145 mmol/L 137 142 140  Potassium 3.5 - 5.1 mmol/L 3.7 4.3 3.8  Chloride 98 - 111 mmol/L 100 102 106  CO2 22 - 32 mmol/L 27 24 24   Calcium 8.9 - 10.3 mg/dL 9.2 9.6 8.7(L)  Total Protein 6.5 - 8.1 g/dL 6.9 6.6 -  Total Bilirubin 0.3 - 1.2 mg/dL 0.8 0.5 -  Alkaline Phos 38 - 126 U/L 57 72 -  AST 15 - 41 U/L 39 49(H) -  ALT 0 - 44 U/L 47(H) 52(H) -    Lab Results  Component Value Date   WBC 7.9 06/17/2020   HGB 9.9 (L) 06/17/2020   HCT 32.1 (L) 06/17/2020   MCV 101.6 (H) 06/17/2020   PLT 213 06/17/2020   NEUTROABS 5.7 06/17/2020    ASSESSMENT & PLAN:  Anemia of chronic disease Patient has had longstanding anemia where the hemoglobin was between 10 to 12 g. Since April 2020 her hemoglobin has gotten worse During the same timeframe her creatinine got from 1.5-2.44   Current treatment:Retacritinjectionsweeklystarted7/31/2020; changed to Aranesp 542m q3 weeks 11/12/2019 Bone marrow biopsy 04/08/2019: Normocellular bone marrow with trilineage hematopoiesis, absent iron  stores, flow cytometry no abnormal B or T-cell clonal abnormalities noted.   IV iron therapy:Venofer: November 2020, July 2021 Retacrit: Last injection9/17/2021 Lab review: 01/22/2020: Hemoglobin 10.3 (previously transfuse for hemoglobin of 7.5) 02/04/2020:Hemoglobin 11.4(did not receive Aranesp injection) 03/14/2020: Hemoglobin 10.5, platelets 108 (did not receive Aranesp injection) 03/25/20:Hemoglobin 10.6 (did not receive Aranesp) 05/13/2020: Hemoglobin 8.9: Receiving Aranesp today 06/17/2020: Hemoglobin 9.9: Receiving Aranesp  She will receive Aranesp every 4 weeks with labs. I will see her in 8 weeks.   No orders of the defined types were placed in this encounter.  The patient has a good understanding of the overall plan. she agrees with it. she will call with any problems that may develop before the next visit here.  Total time spent: 30 mins including face to face time and time spent for planning, charting and coordination of care  GNicholas Lose MD 06/17/2020  I, MCloyde ReamsDorshimer, am acting as scribe for Dr. VNicholas Lose  I have reviewed the above documentation for accuracy and completeness, and I agree with the above.

## 2020-06-16 NOTE — Telephone Encounter (Signed)
Patient called and requested a refill for the Accu Check Fast Click lancets to be sent [place] in [place]

## 2020-06-16 NOTE — Telephone Encounter (Signed)
Rx sent 

## 2020-06-16 NOTE — Assessment & Plan Note (Signed)
Patient has had longstanding anemia where the hemoglobin was between 10 to 12 g. Since April 2020 her hemoglobin has gotten worse During the same timeframe her creatinine got from 1.5-2.44   Current treatment:Retacritinjectionsweeklystarted7/31/2020; changed to Aranesp 547m q3 weeks 11/12/2019 Bone marrow biopsy 04/08/2019: Normocellular bone marrow with trilineage hematopoiesis, absent iron stores, flow cytometry no abnormal B or T-cell clonal abnormalities noted.   IV iron therapy:Venofer: November 2020, July 2021 Retacrit: Last injection9/17/2021 Lab review: 01/22/2020: Hemoglobin 10.3 (previously transfuse for hemoglobin of 7.5) 02/04/2020:Hemoglobin 11.4(did not receive Aranesp injection) 03/14/2020: Hemoglobin 10.5, platelets 108 (did not receive Aranesp injection) 03/25/20:Hemoglobin 10.6 (did not receive Aranesp) 05/13/2020: Hemoglobin 8.9: Receiving Aranesp today   We will see herin 4 weeks with labs and injection

## 2020-06-17 ENCOUNTER — Inpatient Hospital Stay: Payer: Medicare Other

## 2020-06-17 ENCOUNTER — Inpatient Hospital Stay: Payer: Medicare Other | Attending: Adult Health | Admitting: Hematology and Oncology

## 2020-06-17 ENCOUNTER — Other Ambulatory Visit: Payer: Self-pay

## 2020-06-17 DIAGNOSIS — N189 Chronic kidney disease, unspecified: Secondary | ICD-10-CM | POA: Insufficient documentation

## 2020-06-17 DIAGNOSIS — Z79899 Other long term (current) drug therapy: Secondary | ICD-10-CM | POA: Diagnosis not present

## 2020-06-17 DIAGNOSIS — D638 Anemia in other chronic diseases classified elsewhere: Secondary | ICD-10-CM | POA: Diagnosis not present

## 2020-06-17 DIAGNOSIS — D631 Anemia in chronic kidney disease: Secondary | ICD-10-CM | POA: Insufficient documentation

## 2020-06-17 DIAGNOSIS — Z7901 Long term (current) use of anticoagulants: Secondary | ICD-10-CM | POA: Insufficient documentation

## 2020-06-17 DIAGNOSIS — I129 Hypertensive chronic kidney disease with stage 1 through stage 4 chronic kidney disease, or unspecified chronic kidney disease: Secondary | ICD-10-CM | POA: Diagnosis not present

## 2020-06-17 DIAGNOSIS — Z853 Personal history of malignant neoplasm of breast: Secondary | ICD-10-CM | POA: Insufficient documentation

## 2020-06-17 DIAGNOSIS — Z9221 Personal history of antineoplastic chemotherapy: Secondary | ICD-10-CM | POA: Insufficient documentation

## 2020-06-17 DIAGNOSIS — Z170 Estrogen receptor positive status [ER+]: Secondary | ICD-10-CM

## 2020-06-17 DIAGNOSIS — C50211 Malignant neoplasm of upper-inner quadrant of right female breast: Secondary | ICD-10-CM

## 2020-06-17 LAB — CBC WITH DIFFERENTIAL (CANCER CENTER ONLY)
Abs Immature Granulocytes: 0.1 10*3/uL — ABNORMAL HIGH (ref 0.00–0.07)
Basophils Absolute: 0.1 10*3/uL (ref 0.0–0.1)
Basophils Relative: 1 %
Eosinophils Absolute: 0.2 10*3/uL (ref 0.0–0.5)
Eosinophils Relative: 3 %
HCT: 32.1 % — ABNORMAL LOW (ref 36.0–46.0)
Hemoglobin: 9.9 g/dL — ABNORMAL LOW (ref 12.0–15.0)
Immature Granulocytes: 1 %
Lymphocytes Relative: 12 %
Lymphs Abs: 1 10*3/uL (ref 0.7–4.0)
MCH: 31.3 pg (ref 26.0–34.0)
MCHC: 30.8 g/dL (ref 30.0–36.0)
MCV: 101.6 fL — ABNORMAL HIGH (ref 80.0–100.0)
Monocytes Absolute: 0.9 10*3/uL (ref 0.1–1.0)
Monocytes Relative: 12 %
Neutro Abs: 5.7 10*3/uL (ref 1.7–7.7)
Neutrophils Relative %: 71 %
Platelet Count: 213 10*3/uL (ref 150–400)
RBC: 3.16 MIL/uL — ABNORMAL LOW (ref 3.87–5.11)
RDW: 13.2 % (ref 11.5–15.5)
WBC Count: 7.9 10*3/uL (ref 4.0–10.5)
nRBC: 0 % (ref 0.0–0.2)

## 2020-06-17 LAB — IRON AND TIBC
Iron: 69 ug/dL (ref 41–142)
Saturation Ratios: 29 % (ref 21–57)
TIBC: 235 ug/dL — ABNORMAL LOW (ref 236–444)
UIBC: 166 ug/dL (ref 120–384)

## 2020-06-17 LAB — CMP (CANCER CENTER ONLY)
ALT: 46 U/L — ABNORMAL HIGH (ref 0–44)
AST: 44 U/L — ABNORMAL HIGH (ref 15–41)
Albumin: 2.6 g/dL — ABNORMAL LOW (ref 3.5–5.0)
Alkaline Phosphatase: 63 U/L (ref 38–126)
Anion gap: 9 (ref 5–15)
BUN: 42 mg/dL — ABNORMAL HIGH (ref 8–23)
CO2: 26 mmol/L (ref 22–32)
Calcium: 8.9 mg/dL (ref 8.9–10.3)
Chloride: 104 mmol/L (ref 98–111)
Creatinine: 2.9 mg/dL — ABNORMAL HIGH (ref 0.44–1.00)
GFR, Estimated: 16 mL/min — ABNORMAL LOW (ref >60)
Glucose, Bld: 272 mg/dL — ABNORMAL HIGH (ref 70–99)
Potassium: 4.2 mmol/L (ref 3.5–5.1)
Sodium: 139 mmol/L (ref 135–145)
Total Bilirubin: 0.7 mg/dL (ref 0.3–1.2)
Total Protein: 6.5 g/dL (ref 6.5–8.1)

## 2020-06-17 LAB — RETIC PANEL
Immature Retic Fract: 7 % (ref 2.3–15.9)
RBC.: 3.2 MIL/uL — ABNORMAL LOW (ref 3.87–5.11)
Retic Count, Absolute: 42.9 10*3/uL (ref 19.0–186.0)
Retic Ct Pct: 1.3 % (ref 0.4–3.1)
Reticulocyte Hemoglobin: 34.6 pg (ref >27.9)

## 2020-06-17 LAB — SAMPLE TO BLOOD BANK

## 2020-06-17 LAB — FERRITIN: Ferritin: 155 ng/mL (ref 11–307)

## 2020-06-17 MED ORDER — DARBEPOETIN ALFA 500 MCG/ML IJ SOSY
PREFILLED_SYRINGE | INTRAMUSCULAR | Status: AC
  Filled 2020-06-17: qty 1

## 2020-06-17 MED ORDER — DARBEPOETIN ALFA 500 MCG/ML IJ SOSY
500.0000 ug | PREFILLED_SYRINGE | Freq: Once | INTRAMUSCULAR | Status: AC
  Administered 2020-06-17: 500 ug via SUBCUTANEOUS

## 2020-06-17 NOTE — Patient Instructions (Signed)
Darbepoetin Alfa injection What is this medicine? DARBEPOETIN ALFA (dar be POE e tin AL fa) helps your body make more red blood cells. It is used to treat anemia caused by chronic kidney failure and chemotherapy. This medicine may be used for other purposes; ask your health care provider or pharmacist if you have questions. COMMON BRAND NAME(S): Aranesp What should I tell my health care provider before I take this medicine? They need to know if you have any of these conditions:  blood clotting disorders or history of blood clots  cancer patient not on chemotherapy  cystic fibrosis  heart disease, such as angina, heart failure, or a history of a heart attack  hemoglobin level of 12 g/dL or greater  high blood pressure  low levels of folate, iron, or vitamin B12  seizures  an unusual or allergic reaction to darbepoetin, erythropoietin, albumin, hamster proteins, latex, other medicines, foods, dyes, or preservatives  pregnant or trying to get pregnant  breast-feeding How should I use this medicine? This medicine is for injection into a vein or under the skin. It is usually given by a health care professional in a hospital or clinic setting. If you get this medicine at home, you will be taught how to prepare and give this medicine. Use exactly as directed. Take your medicine at regular intervals. Do not take your medicine more often than directed. It is important that you put your used needles and syringes in a special sharps container. Do not put them in a trash can. If you do not have a sharps container, call your pharmacist or healthcare provider to get one. A special MedGuide will be given to you by the pharmacist with each prescription and refill. Be sure to read this information carefully each time. Talk to your pediatrician regarding the use of this medicine in children. While this medicine may be used in children as young as 1 month of age for selected conditions, precautions do  apply. Overdosage: If you think you have taken too much of this medicine contact a poison control center or emergency room at once. NOTE: This medicine is only for you. Do not share this medicine with others. What if I miss a dose? If you miss a dose, take it as soon as you can. If it is almost time for your next dose, take only that dose. Do not take double or extra doses. What may interact with this medicine? Do not take this medicine with any of the following medications:  epoetin alfa This list may not describe all possible interactions. Give your health care provider a list of all the medicines, herbs, non-prescription drugs, or dietary supplements you use. Also tell them if you smoke, drink alcohol, or use illegal drugs. Some items may interact with your medicine. What should I watch for while using this medicine? Your condition will be monitored carefully while you are receiving this medicine. You may need blood work done while you are taking this medicine. This medicine may cause a decrease in vitamin B6. You should make sure that you get enough vitamin B6 while you are taking this medicine. Discuss the foods you eat and the vitamins you take with your health care professional. What side effects may I notice from receiving this medicine? Side effects that you should report to your doctor or health care professional as soon as possible:  allergic reactions like skin rash, itching or hives, swelling of the face, lips, or tongue  breathing problems  changes in   vision  chest pain  confusion, trouble speaking or understanding  feeling faint or lightheaded, falls  high blood pressure  muscle aches or pains  pain, swelling, warmth in the leg  rapid weight gain  severe headaches  sudden numbness or weakness of the face, arm or leg  trouble walking, dizziness, loss of balance or coordination  seizures (convulsions)  swelling of the ankles, feet, hands  unusually weak or  tired Side effects that usually do not require medical attention (report to your doctor or health care professional if they continue or are bothersome):  diarrhea  fever, chills (flu-like symptoms)  headaches  nausea, vomiting  redness, stinging, or swelling at site where injected This list may not describe all possible side effects. Call your doctor for medical advice about side effects. You may report side effects to FDA at 1-800-FDA-1088. Where should I keep my medicine? Keep out of the reach of children. Store in a refrigerator between 2 and 8 degrees C (36 and 46 degrees F). Do not freeze. Do not shake. Throw away any unused portion if using a single-dose vial. Throw away any unused medicine after the expiration date. NOTE: This sheet is a summary. It may not cover all possible information. If you have questions about this medicine, talk to your doctor, pharmacist, or health care provider.  2020 Elsevier/Gold Standard (2017-06-12 16:44:20)  

## 2020-06-20 IMAGING — US IR THORACENTESIS ASP PLEURAL SPACE W/IMG GUIDE
1 series · 3 of 3 positions shown · non-contrast
Comparison: none

INDICATION: Patient with history of dyspnea with exertion/orthopnea since
08/29/2018 and right pleural effusion. Request is made for
diagnostic and therapeutic right thoracentesis.

[Series 1: ir (id) (id)/(id)/(id) ir · 3 of 3 slices shown]
[im 1/3]
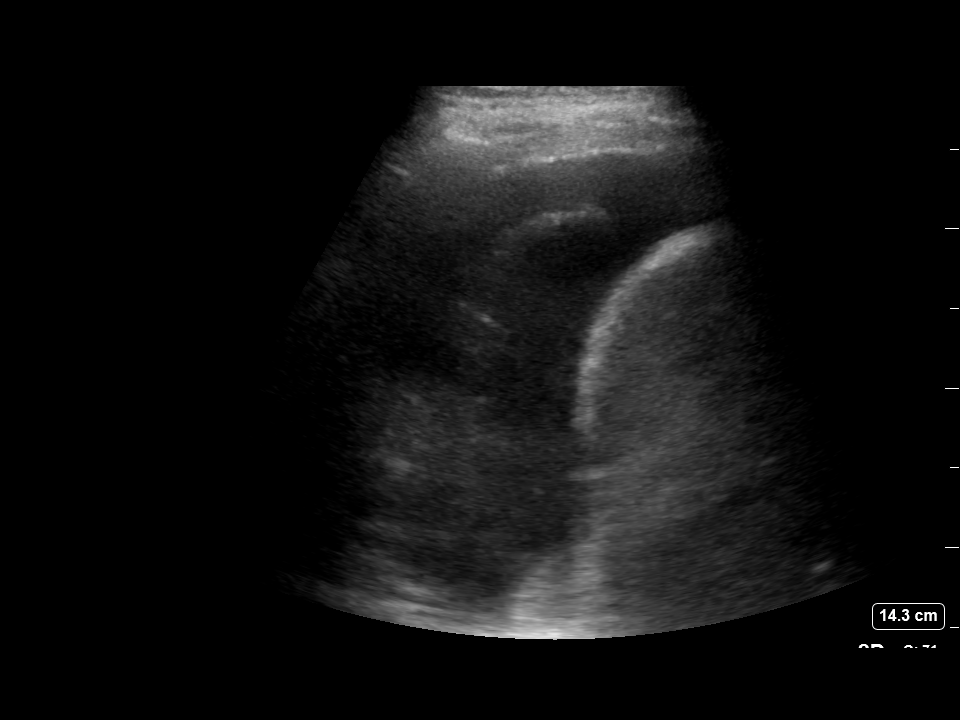
[im 2/3]
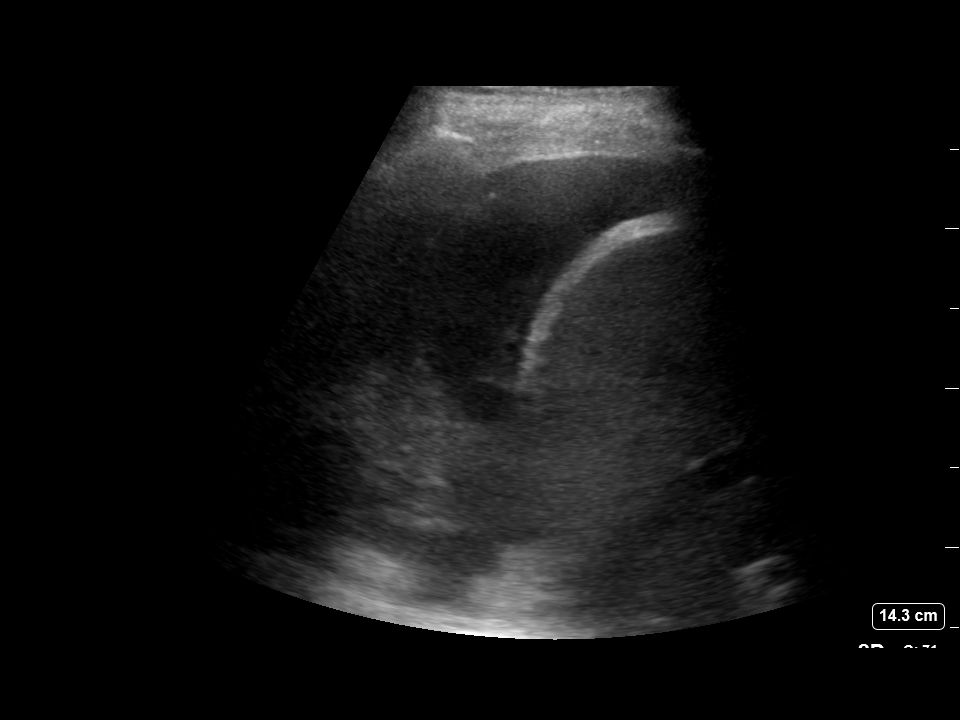
[im 3/3]
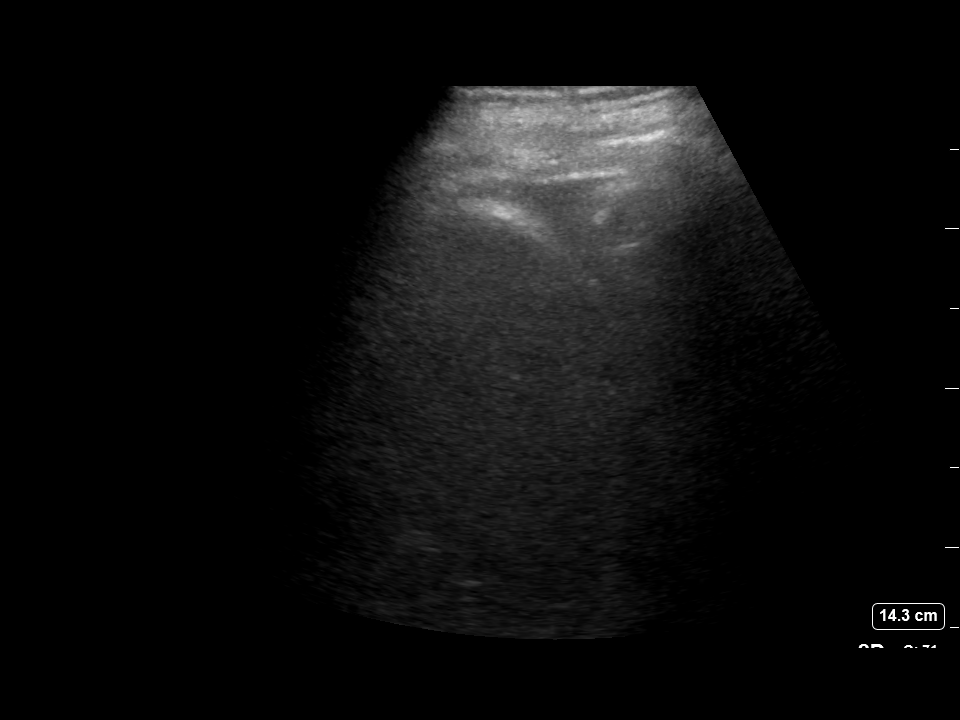

[3 of 3 positions shown; findings below may reference images not displayed]

EXAM:
ULTRASOUND GUIDED DIAGNOSTIC AND THERAPEUTIC RIGHT THORACENTESIS

MEDICATIONS:
10 mL 1% lidocaine

COMPLICATIONS:
None immediate.

PROCEDURE:
An ultrasound guided thoracentesis was thoroughly discussed with the
patient and questions answered. The benefits, risks, alternatives
and complications were also discussed. The patient understands and
wishes to proceed with the procedure. Written consent was obtained.

Ultrasound was performed to localize and mark an adequate pocket of
fluid in the right chest. The area was then prepped and draped in
the normal sterile fashion. 1% Lidocaine was used for local
anesthesia. Under ultrasound guidance a 6 Fr Safe-T-Centesis
catheter was introduced. Thoracentesis was performed. The catheter
was removed and a dressing applied.
FINDINGS: A total of approximately 600 mL of clear gold fluid was removed.
Samples were sent to the laboratory as requested by the clinical
team.
IMPRESSION: Successful ultrasound guided right thoracentesis yielding 600 mL of
pleural fluid.

## 2020-06-20 IMAGING — DX CHEST  1 VIEW
1 series · 1 of 1 positions shown · non-contrast
Comparison: Chest radiograph September 25, 2018

CLINICAL DATA: Status post right-sided thoracentesis

EXAM:
CHEST  1 VIEW

[chest pa]
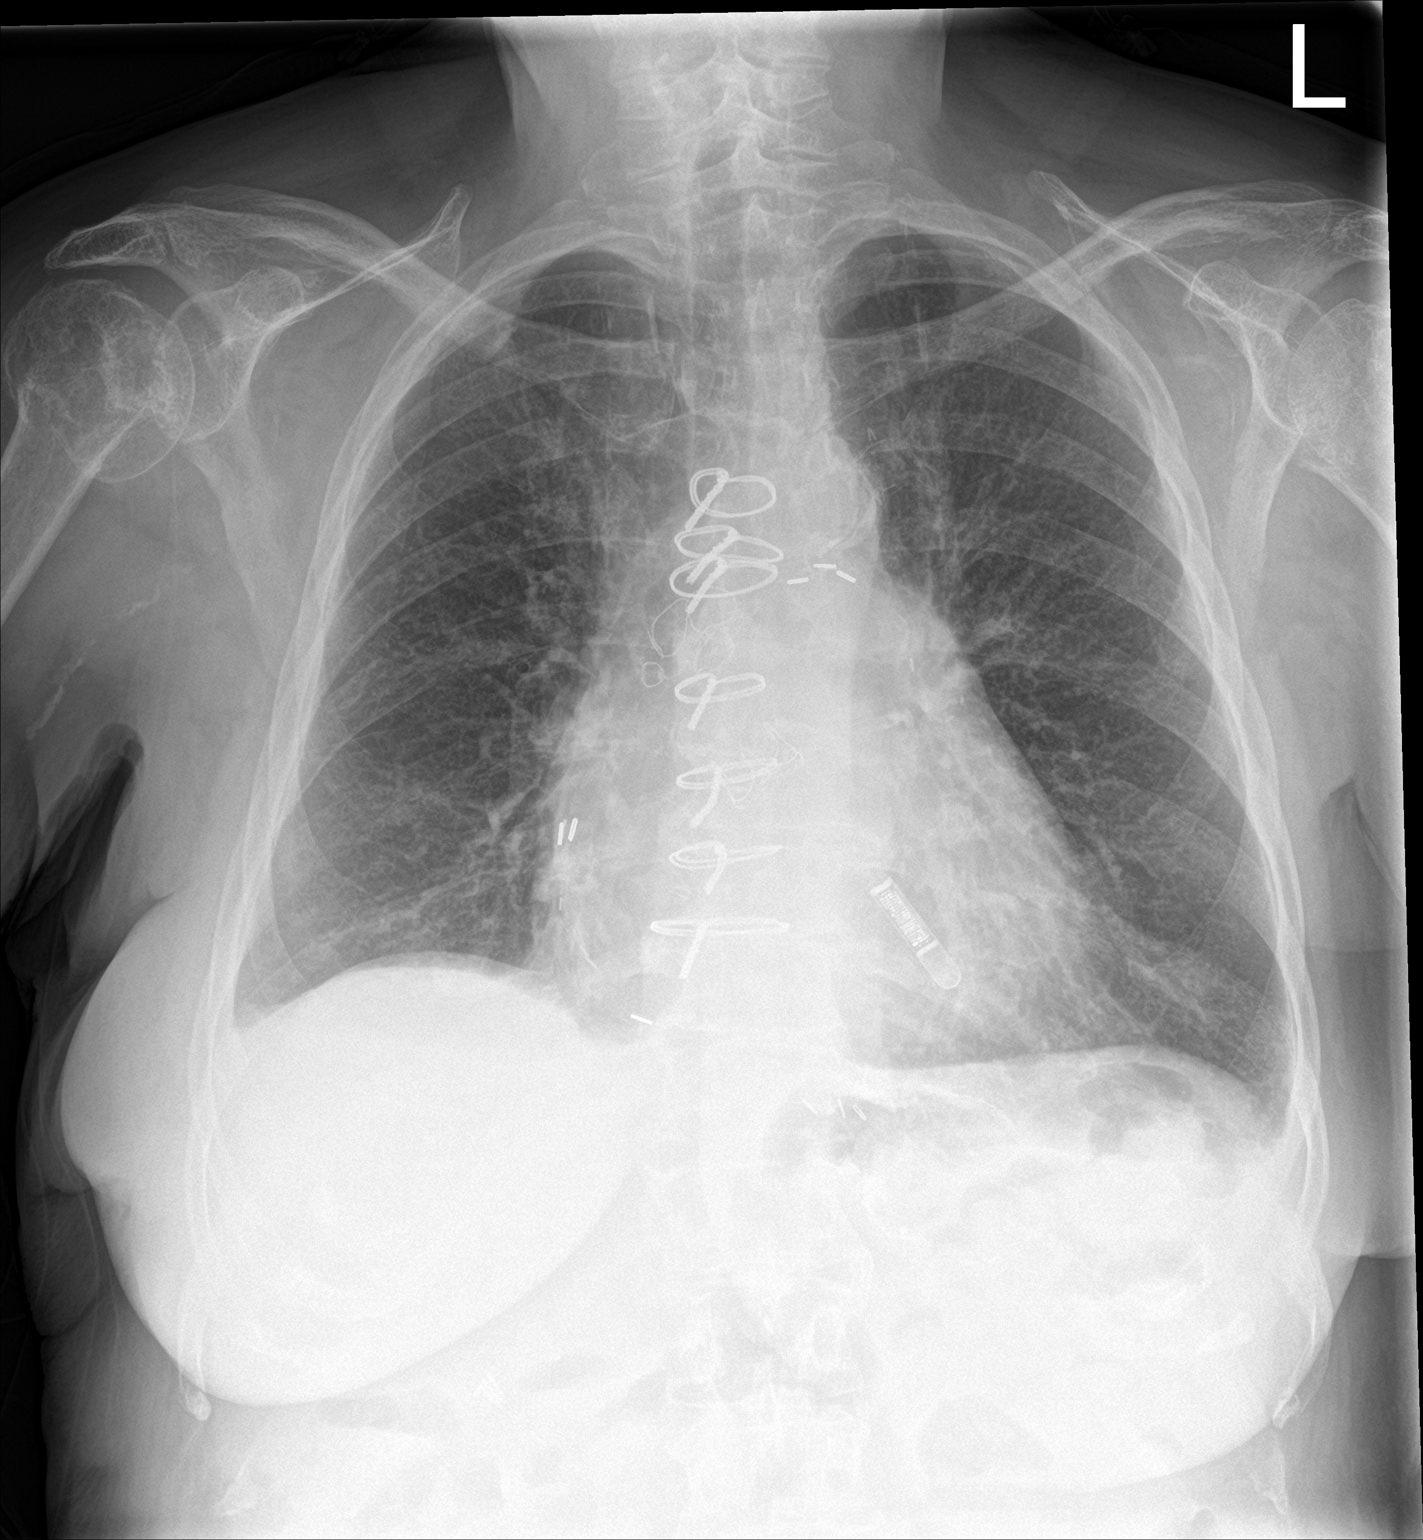

[1 of 1 positions shown; findings below may reference images not displayed]

FINDINGS: No pneumothorax. There is currently only minimal residual pleural
effusion on the right following thoracentesis. There is slight right
base atelectasis. Lungs elsewhere are clear. Heart size and
pulmonary vascularity are normal. Patient is status post aortic
valve replacement and coronary artery bypass grafting. There is
aortic atherosclerosis. There is a loop recorder on the left. Bones
appear osteoporotic.
IMPRESSION: No pneumothorax. Minimal residual right pleural effusion with mild
right base atelectasis. No edema or consolidation. Heart size within
normal limits. Postoperative changes noted. Aortic Atherosclerosis
([id number]).

## 2020-06-23 ENCOUNTER — Telehealth (HOSPITAL_COMMUNITY): Payer: Self-pay | Admitting: *Deleted

## 2020-06-23 MED ORDER — MIDODRINE HCL 2.5 MG PO TABS
5.0000 mg | ORAL_TABLET | Freq: Three times a day (TID) | ORAL | 3 refills | Status: DC
Start: 2020-06-23 — End: 2020-07-25

## 2020-06-23 NOTE — Telephone Encounter (Signed)
She can increase midodrine to 5 mg tid.

## 2020-06-23 NOTE — Telephone Encounter (Signed)
Pt aware and agreeable with plan.  

## 2020-06-23 NOTE — Telephone Encounter (Signed)
Pt called stating her bp is still dropping. Last night she felt like she was going to pass out in the shower and then she started to vomit. During this episode her husband checked her bp it was 73/53. After a few minutes of being out the shower her bp was 132/60.  Pt said she is taking all her medications as prescribed but wondered if there was something else she could do.  Routed to [place]

## 2020-06-27 ENCOUNTER — Ambulatory Visit (INDEPENDENT_AMBULATORY_CARE_PROVIDER_SITE_OTHER): Payer: Medicare Other

## 2020-06-27 ENCOUNTER — Telehealth: Payer: Self-pay

## 2020-06-27 DIAGNOSIS — R55 Syncope and collapse: Secondary | ICD-10-CM

## 2020-06-27 LAB — CUP PACEART REMOTE DEVICE CHECK
Date Time Interrogation Session: 20220115235616
Implantable Pulse Generator Implant Date: 20190322

## 2020-06-27 NOTE — Telephone Encounter (Signed)
[facility] alert- 3 bradycardia episodes (1 EGM avail).  Pt implanted for Syncope.    Spoke with pt, she sent manual transmission for missing episodes.  All 3 episodes occurred around the same time.   Clock on ILR is ~45 minutes fast.  There fore the episodes occurred between 3:30pm and 3:45pm.    At time of episodes pt states she wasn't feeling well so she was sitting in her chair which is what she does when she feels like she might pass out.  Pt reports she had an episode in the shower last week which she called Dr. Oleh Genin office.  She had to sit down in shower as she felt she was going to passs out.  Her husband was able to check her BP and it was low.  She was instructed to increase her Midodrine to mg TID which she did.    Pt confirmed that she is still taking Amiodarone 200mg  daily.  She reports she stopped taking Metoprolol 12.5mg  daily about a week ago because she thought it was making her feel sluggish.  She hasn't really noticed a difference since stopping the medication.    Advised pt I would forward information to Dr. Lovena Le and per her request Dr. Aundra Dubin.

## 2020-06-28 NOTE — Telephone Encounter (Signed)
HR is a little low but not enough to cause the symptoms in isolation.

## 2020-06-30 IMAGING — DX CHEST - 2 VIEW
2 series · 2 of 2 positions shown · non-contrast
Comparison: Prior chest x-ray 10/03/2018 and 09/25/2018

CLINICAL DATA: 73-year-old female with dyspnea on exertion and
orthopnea. Recent right-sided pleural effusion status post
thoracentesis on 10/03/2018

EXAM:
CHEST - 2 VIEW

[chest pa]
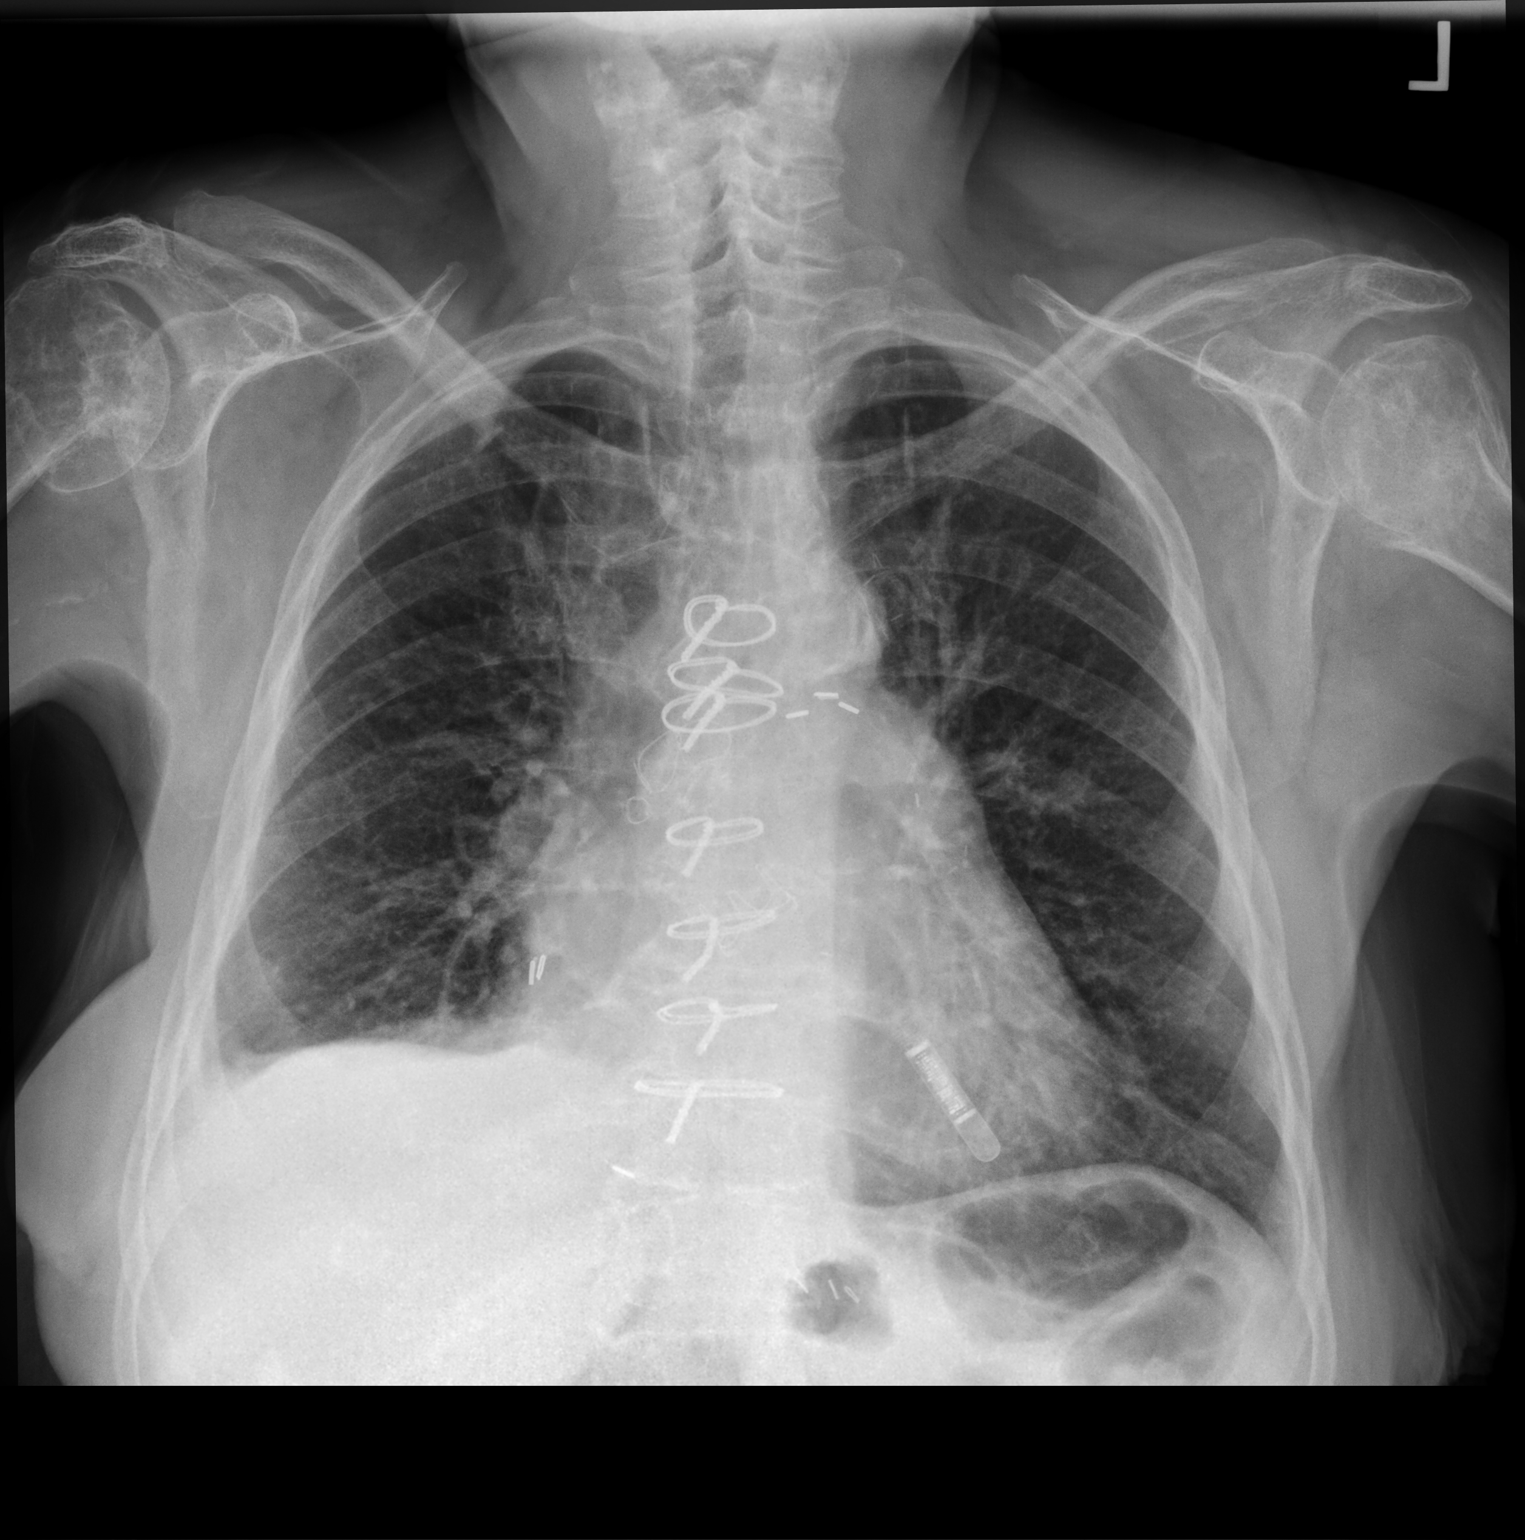

[chest lat]
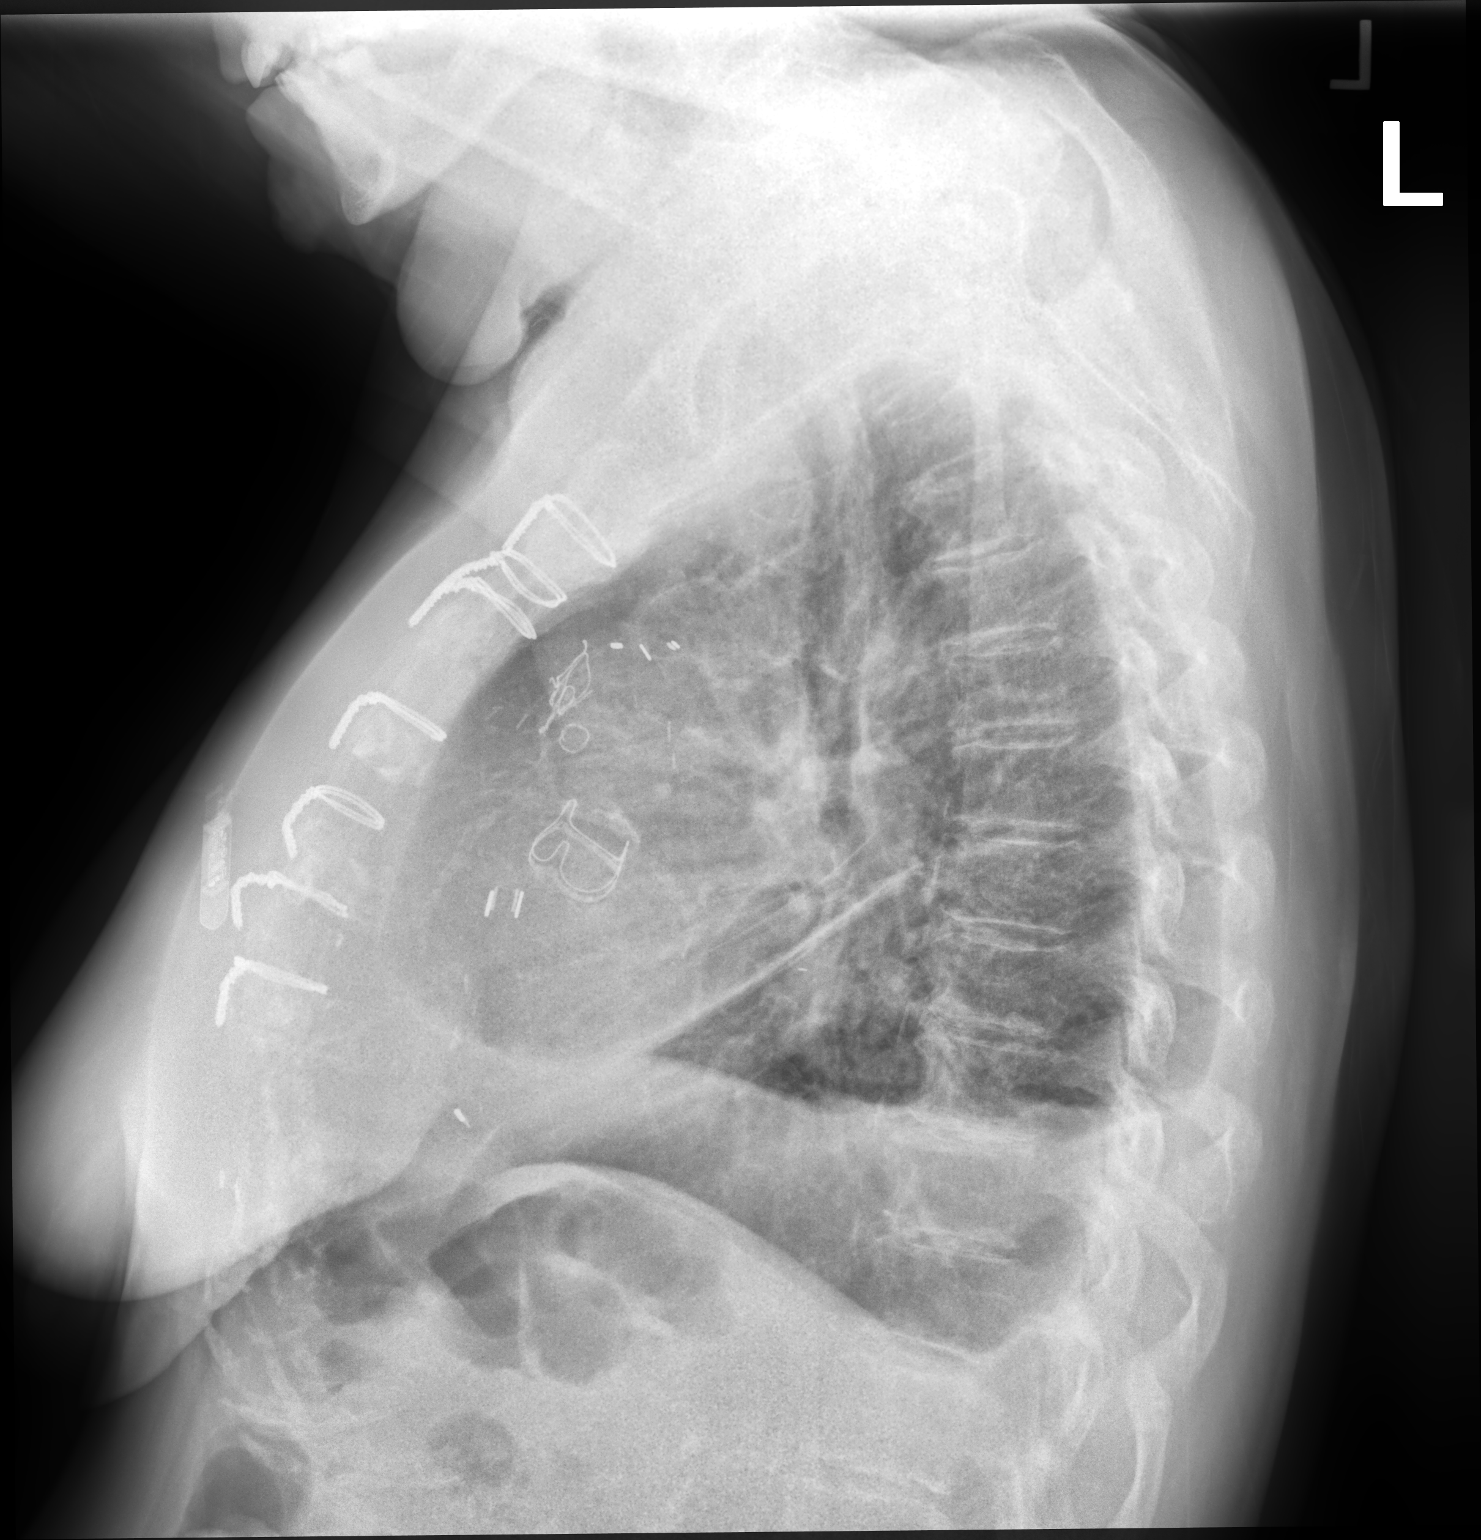

[2 of 2 positions shown; findings below may reference images not displayed]

FINDINGS: Stable mild cardiomegaly. Patient is status post median sternotomy
with evidence of prior multivessel CABG and aortic valve
replacement. An implantable loop recorder projects over the left
heart. Reaccumulation of a small right-sided pleural effusion there
is less fluid than was present on 09/25/2018. no evidence of
pulmonary edema, focal airspace consolidation or pneumothorax. No
acute osseous abnormality. Remote bilateral proximal humeral
fractures.
IMPRESSION: 1. Partial reaccumulation of right pleural effusion. The volume is
less than was present on 09/25/2018.
2. Stable borderline cardiomegaly without evidence of pulmonary
edema.

## 2020-07-09 ENCOUNTER — Other Ambulatory Visit: Payer: Self-pay | Admitting: Endocrinology

## 2020-07-11 ENCOUNTER — Ambulatory Visit: Payer: Medicare Other | Admitting: Endocrinology

## 2020-07-12 NOTE — Progress Notes (Signed)
[facility] Summary Report / Loop Recorder 

## 2020-07-14 ENCOUNTER — Telehealth: Payer: Self-pay

## 2020-07-14 ENCOUNTER — Other Ambulatory Visit: Payer: Self-pay | Admitting: Emergency Medicine

## 2020-07-14 NOTE — Telephone Encounter (Signed)
ILR alert received for 8 new brady episodes detected, not all events sent on transmission.  The one viewed episode is a true brady event for 12 second that occurred at 2021in the evening.  Called patient to request manual transmission. Transmission reviewed by myself and Dr. Lovena Le. 14 new brady episodes logged, longest in duration 37 seconds. Patient reports of generalized fatigue but thought it was AF. States she experienced dizziness this past saturday had syncopal event. No event logged that day.  Per Dr. Lovena Le he would like to see her in office to discuss pacemaker implant. Apt. Scheduled 09/15/20 @ 11:15 am.  Advised patient if she starts not to feel well to call the device clinic. ED precautions given. Verbalized understanding.

## 2020-07-15 ENCOUNTER — Other Ambulatory Visit: Payer: Self-pay

## 2020-07-15 ENCOUNTER — Telehealth: Payer: Self-pay | Admitting: Emergency Medicine

## 2020-07-15 ENCOUNTER — Ambulatory Visit (INDEPENDENT_AMBULATORY_CARE_PROVIDER_SITE_OTHER): Payer: Medicare Other | Admitting: Primary Care

## 2020-07-15 ENCOUNTER — Encounter: Payer: Self-pay | Admitting: Primary Care

## 2020-07-15 ENCOUNTER — Ambulatory Visit: Payer: Medicare Other | Admitting: Endocrinology

## 2020-07-15 DIAGNOSIS — J4531 Mild persistent asthma with (acute) exacerbation: Secondary | ICD-10-CM

## 2020-07-15 IMAGING — DX CHEST - 2 VIEW
2 series · 2 of 2 positions shown · non-contrast
Comparison: 10/13/2018

CLINICAL DATA: 73-year-old female with a history of dyspnea on
exertion

EXAM:
CHEST - 2 VIEW

[chest pa]
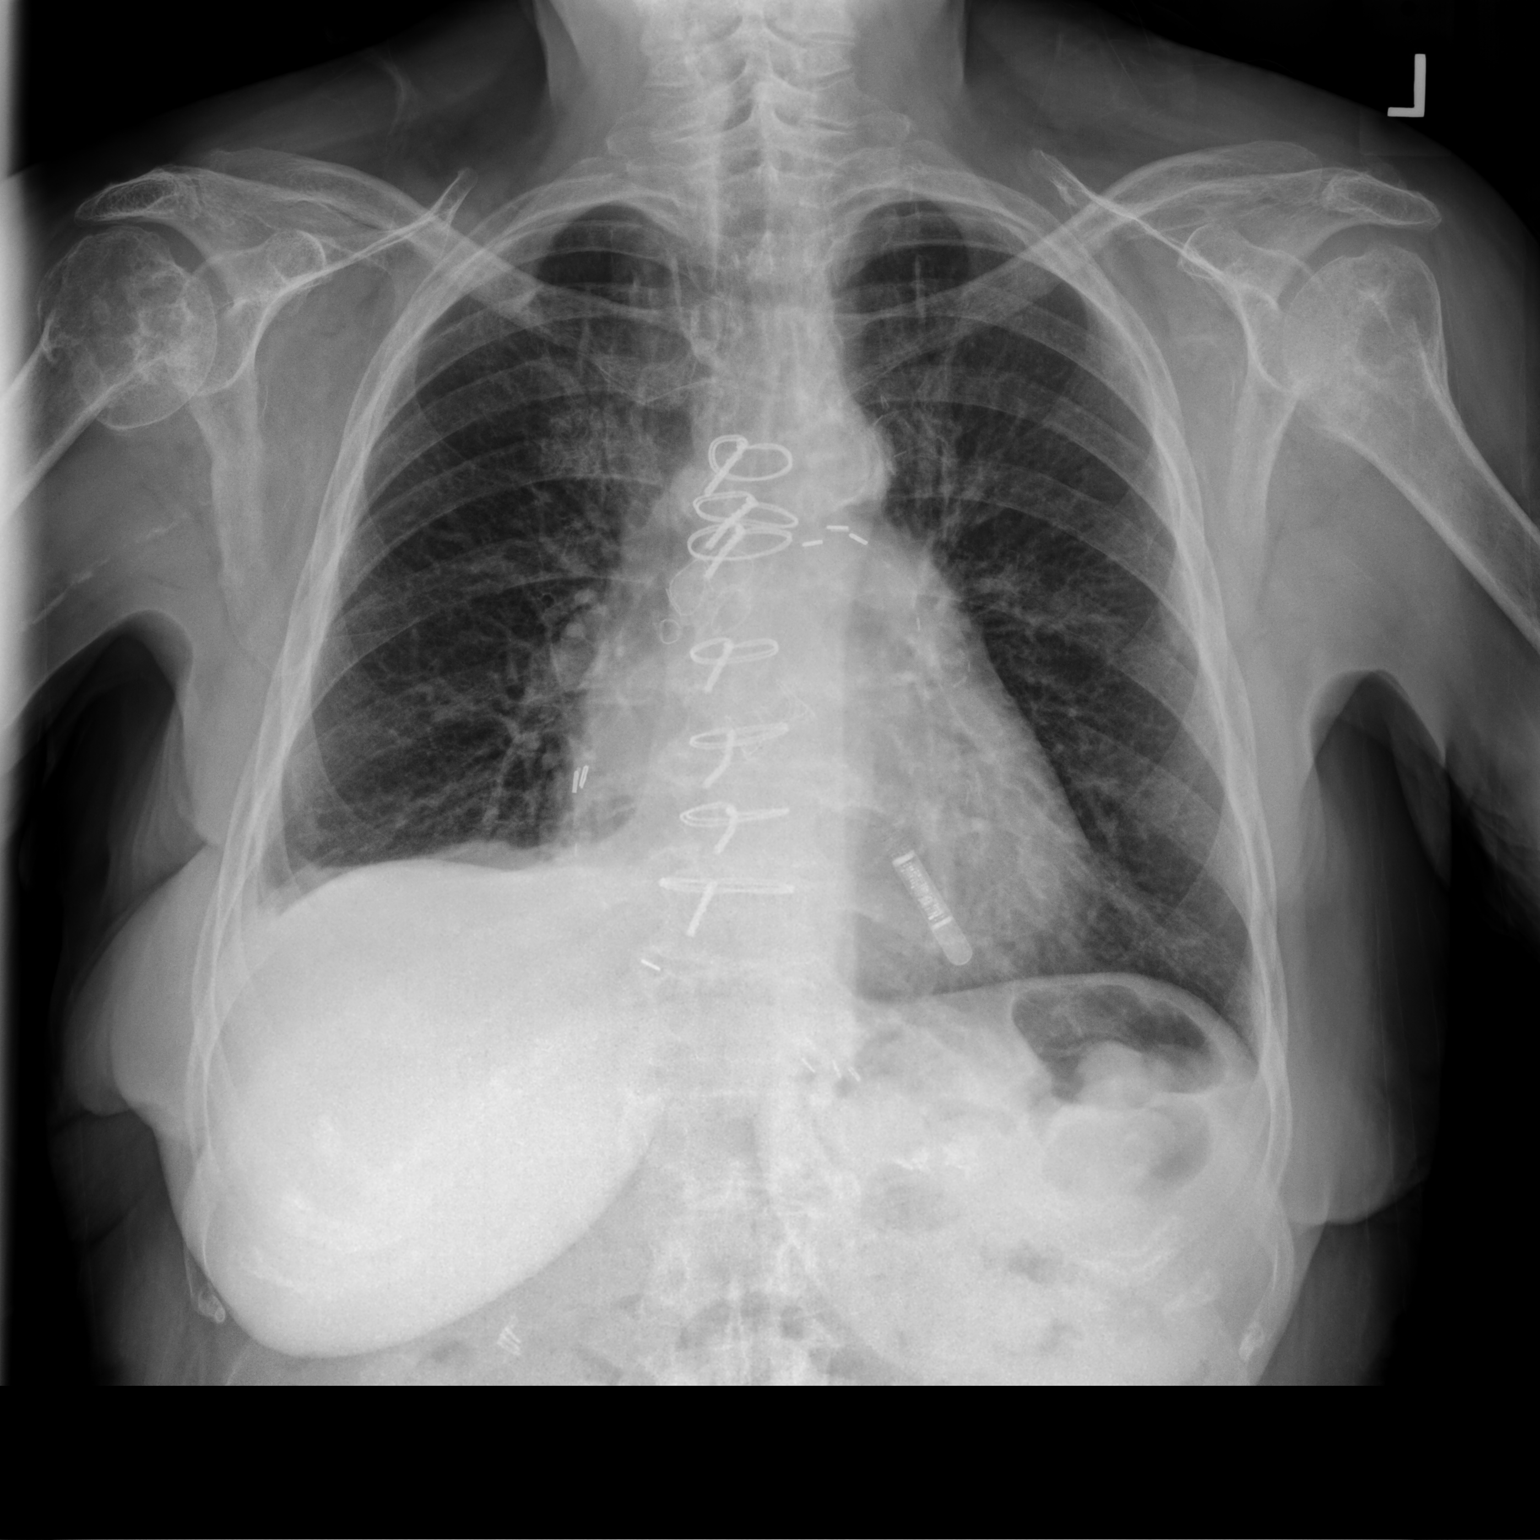

[chest lat]
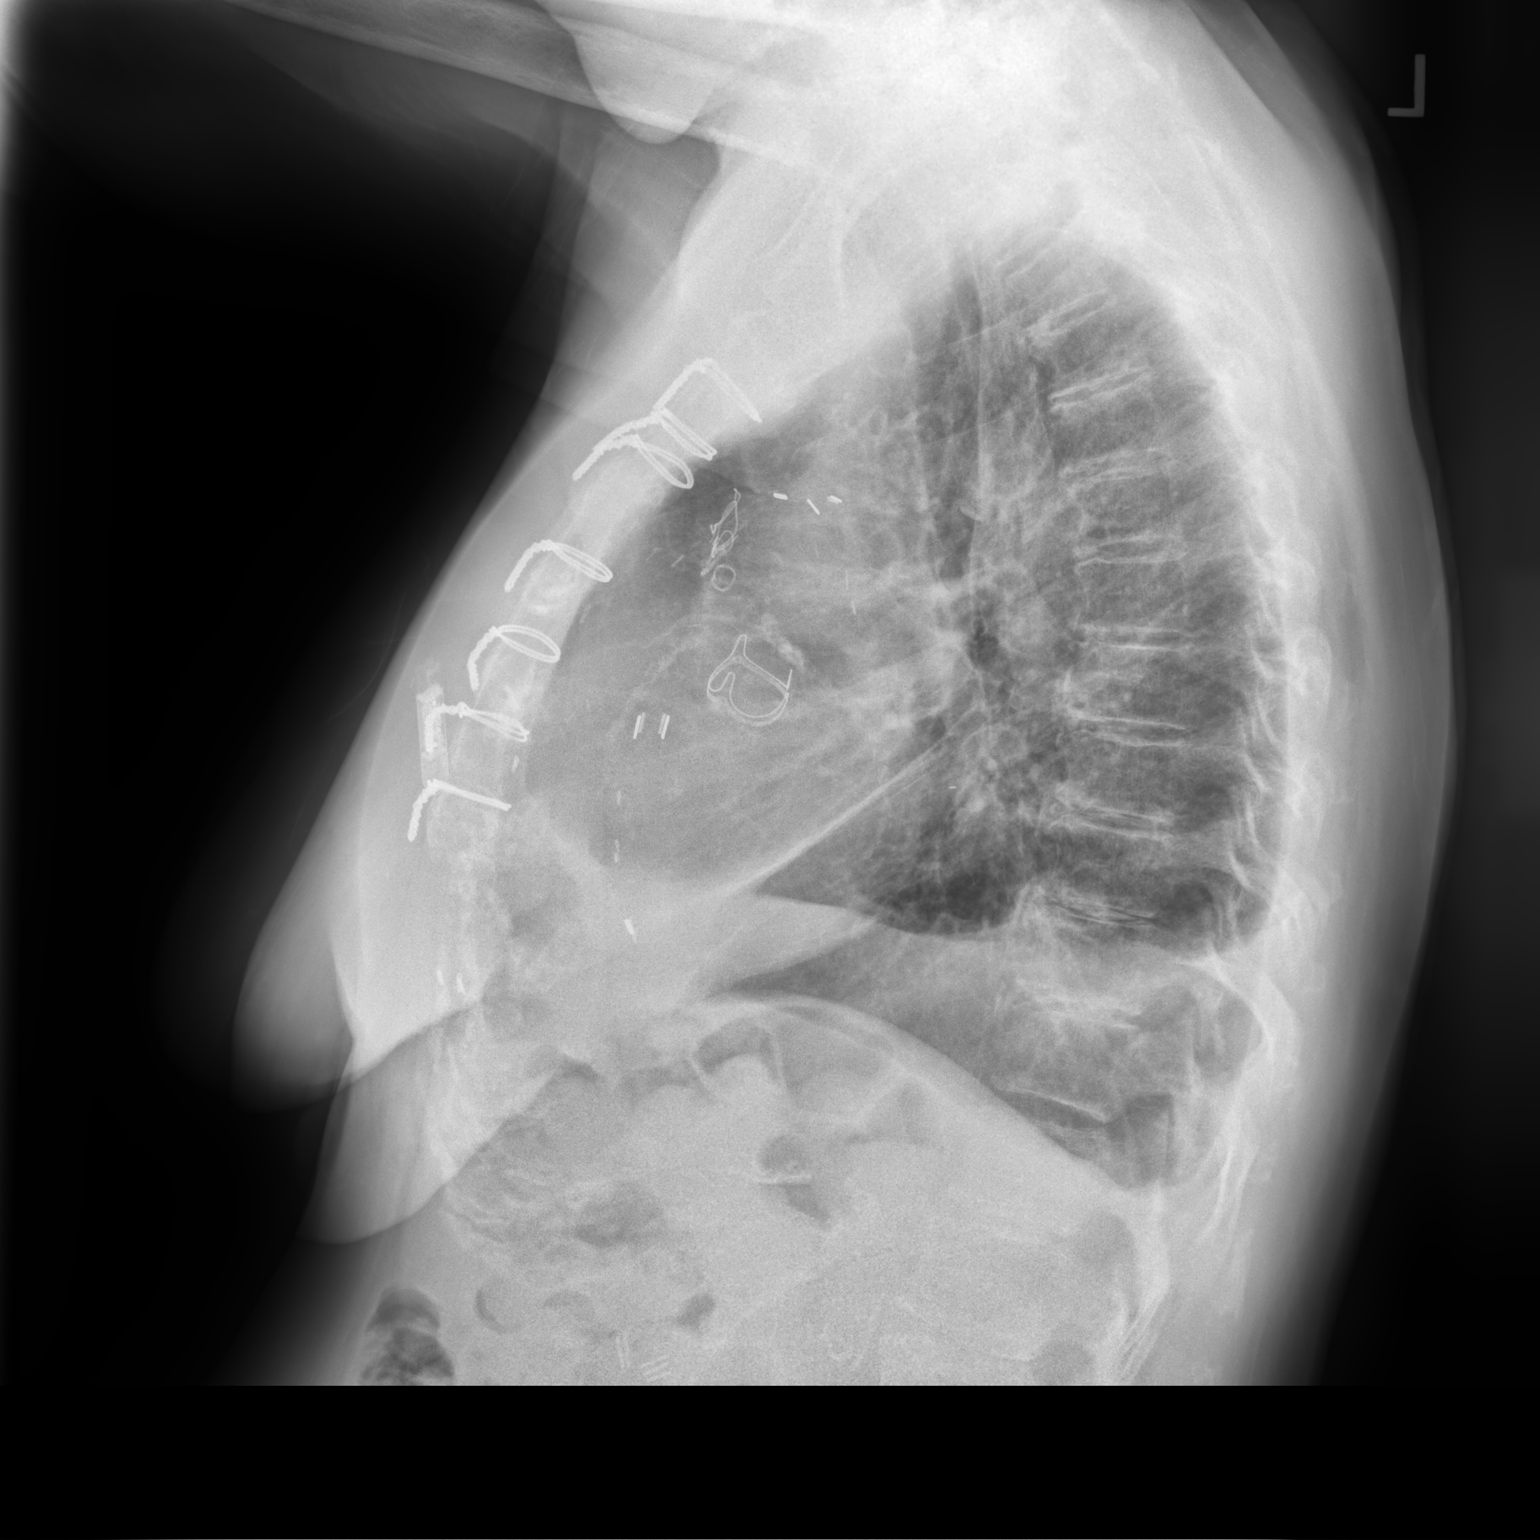

[2 of 2 positions shown; findings below may reference images not displayed]

FINDINGS: Cardiomediastinal silhouette unchanged in size and contour.
Unchanged position of event recorder on the left chest wall.
Surgical changes of median sternotomy CABG and aortic valve repair.

Similar appearance of asymmetric elevation the right hemidiaphragm
with blunting of the right costophrenic angle and meniscus on the
lateral view. Thickening of the fissure on the lateral view. No
pneumothorax. No confluent airspace disease. Coarsened interstitial
markings.

Degenerative changes of the spine with no acute displaced fracture.
Degenerative changes of the shoulders
IMPRESSION: Persisting right pleural effusion and associated atelectasis with no
overt pulmonary edema.

Surgical changes of median sternotomy, CABG, aortic valve repair.

Unchanged cardiac event recorder

## 2020-07-15 MED ORDER — GUAIFENESIN ER 600 MG PO TB12: 600.0000 mg | ORAL_TABLET | Freq: Two times a day (BID) | ORAL | 0 refills | Status: DC | PRN

## 2020-07-15 MED ORDER — AZITHROMYCIN 250 MG PO TABS: ORAL_TABLET | ORAL | 0 refills | Status: DC

## 2020-07-15 MED ORDER — PREDNISONE 10 MG PO TABS: ORAL_TABLET | ORAL | 0 refills | Status: DC

## 2020-07-15 NOTE — Telephone Encounter (Signed)
Called and spoke with pt and she is aware of appt scheduled for today at 12 with BW.

## 2020-07-15 NOTE — Telephone Encounter (Signed)
Patient was last seen April 2021.  She was treated for asthma exacerbation 1 month ago with antibiotics and steroids.  If patient is having another asthma exacerbation she will need to be seen for an office visit for further evaluation and consideration of treatment options. If she has acute symptoms would prefer a telemedicine visit and will need COVID-19 testing. Please contact office for sooner follow up if symptoms do not improve or worsen or seek emergency care

## 2020-07-15 NOTE — Patient Instructions (Signed)
Zpack Prednisone 20mg  x 5 days Take mucinex twice a day for cough Follow-up if not improvement in symptoms

## 2020-07-15 NOTE — Progress Notes (Signed)
Virtual Visit via Telephone Note  I connected with Lelar Farewell Kapaun on 07/15/20 at 12:00 PM EST by telephone and verified that I am speaking with the correct person using two identifiers.  Location: Patient: Home Provider: Office    I discussed the limitations, risks, security and privacy concerns of performing an evaluation and management service by telephone and the availability of in person appointments. I also discussed with the patient that there may be a patient responsible charge related to this service. The patient expressed understanding and agreed to proceed.   History of Present Illness: 75 year old female, never smoked.  Medical history significant for asthma, pleural effusion on right, allergic rhinitis, A. fib, coronary artery disease, chronic diastolic heart failure, nonischemic cardiomyopathy, 2 diabetes, GERD, knee disease stage IV, breast cancer, chemotherapy-induced thrombocytopenia.  Dr. Lamonte Sakai, last seen in office on 10/07/2019.  07/15/2020 Patient contacted today for acute visit. Patient developed congested cough yesterday. Cough is productive with pale grey mucus. Associated wheezing. She is compliant with Symbicort 160 two puffs twice a day. She used albuterol inhaler last night which gave her some short term relief. She last had a flare of bronchitis in December 2021 which was treated with Zpack. She tolerated medication well, she feels she recovered after completing antibiotic course. She has not been vaccinated for covid-19 (stays at home). She did received influenza vaccine this year. Denies f/c/s, shortness of breath, chest tightness, N/V/D.    Observations/Objective:  - Able to speak in full sentences; no overt shortness of breath   Assessment and Plan:  Asthmatic bronchitis with acute exacerbation: - Congested cough with grey mucus with associated wheezing  - Continue Symbicort 160 two puffs twice a day - Recommend patient use albuterol nebulizer 2-3 times a day  for the next week - Start mucinex 600mg  twice a day for chest congestion/cough  - RX ZPACK and prednisone 20mg  x 5 days   Follow Up Instructions:  - Follow-up if no improvement or symptoms worsen    I discussed the assessment and treatment plan with the patient. The patient was provided an opportunity to ask questions and all were answered. The patient agreed with the plan and demonstrated an understanding of the instructions.   The patient was advised to call back or seek an in-person evaluation if the symptoms worsen or if the condition fails to improve as anticipated.  I provided 22 minutes of non-face-to-face time during this encounter.   Martyn Ehrich, NP

## 2020-07-15 NOTE — Telephone Encounter (Signed)
Called and spoke with pt and she stated that she was sick back in December with the same thing.    She is having a deep wet cough that sometimes produces coagulated white/pale sputum.  She is wheezing some and she is using her symbicort and rescue inhaler.  She denies any other symptoms of fever, body aches or chills.  She stated that she was given ABX and pred last time and this helped her a great deal.  RB is night float,  TP please advise. Thanks  Allergies  Allergen Reactions  . Amoxicillin Rash and Other (See Comments)    Tolerates Cephalosporins Has patient had a PCN reaction causing immediate rash, facial/tongue/throat swelling, SOB or lightheadedness with hypotension: Yes Has patient had a PCN reaction causing severe rash involving mucus membranes or skin necrosis: Yes Has patient had a PCN reaction that required hospitalization No Has patient had a PCN reaction occurring within the last 10 years: No If all of the above answers are "NO", then may proceed with Cephalosporin use.   . Tape Other (See Comments)    Pulls off skin, must use paper tape  . Aldactone [Spironolactone] Other (See Comments)    CKD/hypokalemia  . Arimidex [Anastrozole] Nausea Only  . Latex Itching and Other (See Comments)    (Dentist office)  . Tetracycline Rash

## 2020-07-18 ENCOUNTER — Other Ambulatory Visit: Payer: Self-pay

## 2020-07-18 ENCOUNTER — Other Ambulatory Visit: Payer: Self-pay | Admitting: Nurse Practitioner

## 2020-07-18 ENCOUNTER — Ambulatory Visit (INDEPENDENT_AMBULATORY_CARE_PROVIDER_SITE_OTHER): Payer: Medicare Other | Admitting: Internal Medicine

## 2020-07-18 VITALS — BP 124/60 | HR 78 | Ht 64.0 in | Wt 126.0 lb

## 2020-07-18 DIAGNOSIS — I5032 Chronic diastolic (congestive) heart failure: Secondary | ICD-10-CM | POA: Diagnosis not present

## 2020-07-18 DIAGNOSIS — R55 Syncope and collapse: Secondary | ICD-10-CM

## 2020-07-18 DIAGNOSIS — Z01810 Encounter for preprocedural cardiovascular examination: Secondary | ICD-10-CM

## 2020-07-18 LAB — CBC WITH DIFFERENTIAL/PLATELET
Basophils Absolute: 0 10*3/uL (ref 0.0–0.2)
Basos: 0 %
EOS (ABSOLUTE): 0 10*3/uL (ref 0.0–0.4)
Eos: 0 %
Hematocrit: 35.5 % (ref 34.0–46.6)
Hemoglobin: 11 g/dL — ABNORMAL LOW (ref 11.1–15.9)
Immature Grans (Abs): 0 10*3/uL (ref 0.0–0.1)
Immature Granulocytes: 0 %
Lymphocytes Absolute: 0.3 10*3/uL — ABNORMAL LOW (ref 0.7–3.1)
Lymphs: 4 %
MCH: 29.9 pg (ref 26.6–33.0)
MCHC: 31 g/dL — ABNORMAL LOW (ref 31.5–35.7)
MCV: 97 fL (ref 79–97)
Monocytes Absolute: 0.5 10*3/uL (ref 0.1–0.9)
Monocytes: 5 %
Neutrophils Absolute: 8.8 10*3/uL — ABNORMAL HIGH (ref 1.4–7.0)
Neutrophils: 91 %
Platelets: 184 10*3/uL (ref 150–450)
RBC: 3.68 x10E6/uL — ABNORMAL LOW (ref 3.77–5.28)
RDW: 13 % (ref 11.7–15.4)
WBC: 9.7 10*3/uL (ref 3.4–10.8)

## 2020-07-18 LAB — BASIC METABOLIC PANEL
BUN/Creatinine Ratio: 17 (ref 12–28)
BUN: 41 mg/dL — ABNORMAL HIGH (ref 8–27)
CO2: 19 mmol/L — ABNORMAL LOW (ref 20–29)
Calcium: 9.1 mg/dL (ref 8.7–10.3)
Chloride: 106 mmol/L (ref 96–106)
Creatinine, Ser: 2.44 mg/dL — ABNORMAL HIGH (ref 0.57–1.00)
GFR calc Af Amer: 22 mL/min/{1.73_m2} — ABNORMAL LOW (ref >59)
GFR calc non Af Amer: 19 mL/min/{1.73_m2} — ABNORMAL LOW (ref >59)
Glucose: 223 mg/dL — ABNORMAL HIGH (ref 65–99)
Potassium: 5 mmol/L (ref 3.5–5.2)
Sodium: 139 mmol/L (ref 134–144)

## 2020-07-18 NOTE — Telephone Encounter (Signed)
Pt's age 75, wt 58.9 kg, SCr 2.9, CrCl 15.83, last ov w/ DM 06/14/20.

## 2020-07-18 NOTE — H&P (View-Only) (Signed)
HPI Hannah [name] returns today for followup of her atrial fib. She is a pleasant 75 yo woman, with HTN, s/p AVR who has had refractory atrial fib. She has been on amio and her dose was increased initially but when I saw her last she reduced her dose back to 200 mg daily.  Her symptoms are much improved. No chest pain or sob. No syncope. She has continued to have symptomatic bradycardia and her ILR demonstrates frequent episodes of HR's in the 30's during the daytime. She also has a h/o atrial fib with a RVR.  Allergies  Allergen Reactions  . Amoxicillin Rash and Other (See Comments)    Tolerates Cephalosporins Has patient had a PCN reaction causing immediate rash, facial/tongue/throat swelling, SOB or lightheadedness with hypotension: Yes Has patient had a PCN reaction causing severe rash involving mucus membranes or skin necrosis: Yes Has patient had a PCN reaction that required hospitalization No Has patient had a PCN reaction occurring within the last 10 years: No If all of the above answers are "NO", then may proceed with Cephalosporin use.   . Tape Other (See Comments)    Pulls off skin, must use paper tape  . Aldactone [Spironolactone] Other (See Comments)    CKD/hypokalemia  . Arimidex [Anastrozole] Nausea Only  . Latex Itching and Other (See Comments)    (Dentist office)  . Tetracycline Rash     Current Outpatient Medications  Medication Sig Dispense Refill  . Accu-Chek FastClix Lancets MISC Use to check blood sugar 3 times per day 100 each 5  . ACCU-CHEK GUIDE test strip Use to test blood sugar 3 times daily 150 strip 3  . acetaminophen (TYLENOL) 500 MG tablet Take 1,000 mg by mouth as needed for moderate pain.    Marland Kitchen albuterol (PROVENTIL) (2.5 MG/3ML) 0.083% nebulizer solution Take 3 mLs (2.5 mg total) by nebulization every 6 (six) hours as needed for wheezing or shortness of breath. 75 mL 3  . albuterol (VENTOLIN HFA) 108 (90 Base) MCG/ACT inhaler Inhale 2 puffs into  the lungs every 6 (six) hours as needed for wheezing or shortness of breath. 18 g 5  . amiodarone (PACERONE) 200 MG tablet TAKE ONE TABLET BY MOUTH DAILY (Patient taking differently: Take 200 mg by mouth daily.) 90 tablet 3  . azithromycin (ZITHROMAX) 250 MG tablet Zpack taper as directed 6 tablet 0  . Blood Glucose Monitoring Suppl (ACCU-CHEK GUIDE ME) w/Device KIT Use accu chek meter to check blood sugar three times daily. 1 kit 0  . cetirizine (ZYRTEC) 10 MG tablet Take 10 mg by mouth daily.    . Cholecalciferol (DIALYVITE VITAMIN D 5000) 125 MCG (5000 UT) capsule Take 5,000 Units by mouth daily.    Marland Kitchen ELIQUIS 2.5 MG TABS tablet Take 1 tablet (2.5 mg total) by mouth 2 (two) times daily. 180 tablet 1  . exemestane (AROMASIN) 25 MG tablet TAKE ONE TABLET BY MOUTH DAILY AFTER BREAKFAST (Patient taking differently: Take 25 mg by mouth daily after breakfast.) 90 tablet 3  . ezetimibe (ZETIA) 10 MG tablet TAKE ONE TABLET BY MOUTH EVERY DAY (Patient taking differently: Take 10 mg by mouth daily.) 90 tablet 3  . famotidine (PEPCID) 20 MG tablet Take 20 mg by mouth 2 (two) times daily.    . furosemide (LASIX) 20 MG tablet Take 1 tablet (20 mg total) by mouth daily. 30 tablet 3  . guaiFENesin (MUCINEX) 600 MG 12 hr tablet Take 1 tablet (600 mg total) by  mouth 2 (two) times daily as needed for cough. 30 tablet 0  . HUMALOG KWIKPEN 100 UNIT/ML KwikPen INJECT SUBCUTANEOUSLY 4 TO  6 UNITS 3 TIMES DAILY  BEFORE MEALS 30 mL 3  . insulin glargine (LANTUS) 100 UNIT/ML injection Inject 20 Units into the skin daily.     . Insulin Lispro (HUMALOG KWIKPEN [place]) Inject 6-8 Units into the skin 3 (three) times daily.     . Insulin Pen Needle (BD PEN NEEDLE NANO U/F) 32G X 4 MM MISC USE AS INSTRUCTED TO INJECT INSULIN 4 TIMES DAILY. 360 each 1  . metoprolol succinate (TOPROL XL) 25 MG 24 hr tablet Take 0.5 tablets (12.5 mg total) by mouth daily. 45 tablet 3  . midodrine (PROAMATINE) 2.5 MG tablet Take 2 tablets (5 mg total)  by mouth 3 (three) times daily with meals. 270 tablet 3  . nitroGLYCERIN (NITROSTAT) 0.4 MG SL tablet Place 1 tablet (0.4 mg total) under the tongue every 5 (five) minutes as needed for chest pain. 25 tablet 3  . ondansetron (ZOFRAN ODT) 4 MG disintegrating tablet Take 1 tablet (4 mg total) by mouth every 8 (eight) hours as needed. (Patient taking differently: Take 4 mg by mouth every 8 (eight) hours as needed for nausea or vomiting.) 10 tablet 0  . predniSONE (DELTASONE) 10 MG tablet Take 2 tabs x 5 days 10 tablet 0  . rosuvastatin (CRESTOR) 40 MG tablet Take 1 tablet (40 mg total) by mouth at bedtime. 90 tablet 3  . SYMBICORT 160-4.5 MCG/ACT inhaler INHALE TWO PUFFS INTO THE LUNGS TWICE DAILY 10.2 g 1  . TRULICITY 1.5 SW/9.6PR SOPN INJECT THE CONTENTS OF ONE  PEN SUBCUTANEOUSLY WEEKLY  AS DIRECTED (Patient taking differently: Inject 1.5 mg into the skin once a week. Wednesday) 6 mL 3  . vitamin B-12 (CYANOCOBALAMIN) 100 MCG tablet Take 100 mcg by mouth daily.    Marland Kitchen zolpidem (AMBIEN) 5 MG tablet Take 5 mg by mouth at bedtime.     No current facility-administered medications for this visit.     Past Medical History:  Diagnosis Date  . Abnormally small mouth   . Allergic rhinitis 10/14/2009   Qualifier: Diagnosis of  By: Lamonte Sakai MD, Rose Fillers   Overview:  Overview:  Qualifier: Diagnosis of  By: Lamonte Sakai MD, Rose Fillers  Last Assessment & Plan:  Please continue Xyzal and Nasacort as you have been using them  . Anemia   . Asthma 05/12/2009   10/12/2014 p extensive coaching HFA effectiveness =    75% s spacer    Overview:  Overview:  10/12/2014 p extensive coaching HFA effectiveness =    75% s spacer   Last Assessment & Plan:  Please continue Symbicort 2 puffs twice a day. Remember to rinse and gargle after taking this medication. Take albuterol 2 puffs up to every 4 hours if needed for shortness of breath.  Follow with Dr Lamonte Sakai in 6 month  . Bilateral carotid artery stenosis    a. mild 1-39% by duplex 10/2017, due  2021.  . Breast cancer ([place]) 01/18/2016   right breast  . CAD (coronary artery disease) 01/24/2011   a. s/p CABG 1998 with redo 2009.  Marland Kitchen Chemotherapy induced nausea and vomiting 02/24/2016  . 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 Chronic diastolic CHF (congestive heart failure) ([place]) 09/24/2013  . CKD (chronic kidney disease), stage IV ([place]) 09/24/2013  . Closed fracture of head of left humerus 09/09/2017  . Complication  of anesthesia   . Dysrhythmia    a-fib  . Gallstones   . GERD (gastroesophageal reflux disease)   . Glaucoma   . H/O atrial tachycardia 05/12/2009   Qualifier: History of  By: Lamonte Sakai MD, Rose Fillers   Overview:  Overview:  Qualifier: History of  By: Lamonte Sakai MD, Rose Fillers  Last Assessment & Plan:  There was no evidence of this on her cardiac monitor. I would recommend watchful waiting.   Marland Kitchen Heart murmur   . History of kidney stones   . History of non-ST elevation myocardial infarction (NSTEMI)    Sept 2014--  thought to be type II HTN w/ LHC without infarct related artery and patent grafts  . History of radiation therapy 05/24/16-07/26/16   right breast 50.4 Gy in 28 fractions, right breast boost 10 Gy in 5 fractions  . Hyperlipidemia   . Hypotension   . Iron deficiency anemia   . Mania ([place]) 03/05/2016  . Moderate persistent asthma    pulmologist-  Dr. Malvin Johns  . Osteomyelitis of toe of left foot ([place]) 04/06/2016  . Osteopenia of multiple sites 10/19/2015  . PAF (paroxysmal atrial fibrillation) ([place])   . Personal history of chemotherapy 2017  . Personal history of radiation therapy 2017  . PONV (postoperative nausea and vomiting)   . Port catheter in place 02/17/2016  . Psoriasis    right leg  . Renal calculus, right   . S/P AVR    prosthesis valve placement 2009 at same time re-do CABG  . Seizure-like activity ([place]) 09/01/2017  . Sensorineural hearing loss (SNHL) of both ears 01/06/2016  . Stroke  [facility]) 2014   residual rt hearing loss  . Syncope 09/06/2017  . Type 2 diabetes mellitus ([place])    monitored by dr Dwyane Dee    ROS:   All systems reviewed and negative except as noted in the HPI.   Past Surgical History:  Procedure Laterality Date  . AORTIC VALVE REPLACEMENT  2009   [facility] is [facility]  . BREAST BIOPSY Right 02/12/2019   2 lymphnodes, 1 breast  . BREAST LUMPECTOMY Right 01/18/2016  . BREAST LUMPECTOMY WITH RADIOACTIVE SEED AND SENTINEL LYMPH NODE BIOPSY Right 01/18/2016   Procedure: RIGHT BREAST LUMPECTOMY WITH RADIOACTIVE SEED AND SENTINEL LYMPH NODE BIOPSY;  Surgeon: DAlphonsa Overall MD;  [facility]  Service: General;  Laterality: Right;  . CARDIAC CATHETERIZATION  03/23/2008   Pre-redo CABG: L main OK, LAD (T), CFX (T), OM1 99%, RCA (T), LIMA-LAD OK, SVG-OM(?3) OK w/ little florw to OM2, SVG-RCA OK. EF NL  . CARPAL TUNNEL RELEASE    . CHEST TUBE INSERTION Right 06/26/2019   Procedure: INSERTION PLEURAL DRAINAGE CATHETER;  Surgeon: BGaye Pollack MD;  [facility]: [facility]  Service: Thoracic;  Laterality: Right;  . CHOLECYSTECTOMY N/A 07/07/2018   Procedure: LAPAROSCOPIC CHOLECYSTECTOMY WITH INTRAOPERATIVE CHOLANGIOGRAM ERAS PATHWAY;  Surgeon: NAlphonsa Overall MD;  Location: [facility];  Service: General;  Laterality: N/A;  . COLONOSCOPY     around 2015. Possibly with [facility]   . CORONARY ARTERY BYPASS GRAFT  1998 &  re-do 2009   Had LIMA to DX/LAD, SVG to 2 marginal branches [facility]; SVG to 3rd OM and PD at time of redo  . CYSTOSCOPY W/ URETERAL STENT PLACEMENT Right 12/20/2014   Procedure: CYSTOSCOPY WITH RETROGRADE PYELOGRAM/URETERAL STENT PLACEMENT;  Surgeon: PCleon Gustin MD;  [facility]  Service: Urology;  Laterality: Right;  . CYSTOSCOPY WITH RETROGRADE PYELOGRAM, URETEROSCOPY AND STENT PLACEMENT Left 08/21/2019   Procedure: CYSTOSCOPY WITH RETROGRADE PYELOGRAM, URETEROSCOPY AND  STENT PLACEMENT;  Surgeon: Cleon Gustin, MD;  Location[facility];  Service: Urology;  Laterality: Left;  1 HR  . ESOPHAGOGASTRODUODENOSCOPY     many years ago per patient   . ESOPHAGOGASTRODUODENOSCOPY ENDOSCOPY  06/17/2018  . EYE SURGERY Bilateral    cataracts  . HOLMIUM LASER APPLICATION Right 5/32/0233   Procedure:  HOLMIUM LASER LITHOTRIPSY;  Surgeon: Cleon Gustin, MD;  Location: [facility];  Service: Urology;  Laterality: Right;  . HOLMIUM LASER APPLICATION Left 4/35/6861   Procedure: HOLMIUM LASER APPLICATION;  Surgeon: Cleon Gustin, MD;  [facility];  Service: Urology;  Laterality: Left;  . IR KYPHO LUMBAR INC FX REDUCE BONE BX UNI/BIL CANNULATION INC/IMAGING  02/12/2020  . IR THORACENTESIS ASP PLEURAL SPACE W/IMG GUIDE  10/03/2018  . IR THORACENTESIS ASP PLEURAL SPACE W/IMG GUIDE  06/08/2019  . LEFT HEART CATHETERIZATION WITH CORONARY/GRAFT ANGIOGRAM N/A 02/23/2013   Procedure: LEFT HEART CATHETERIZATION WITH Beatrix Fetters;  Surgeon: Blane Ohara, MD;  Location: [facility] CATH LAB;  Service: Cardiovascular;  Laterality: N/A;  . LOOP RECORDER INSERTION N/A 08/30/2017   Procedure: LOOP RECORDER INSERTION;  Surgeon: Evans Lance, MD;  Location: [place] CV LAB;  Service: Cardiovascular;  Laterality: N/A;  . PORTACATH PLACEMENT Left 01/18/2016   Procedure: INSERTION PORT-A-CATH;  Surgeon: Alphonsa Overall, MD;  Location: [place];  Service: General;  Laterality: Left;  . portacath removal    . REMOVAL OF PLEURAL DRAINAGE CATHETER Right 07/14/2019   Procedure: REMOVAL OF PLEURAL DRAINAGE CATHETER;  Surgeon: Gaye Pollack, MD;  Location: [place];  Service: Thoracic;  Laterality: Right;  . TALC PLEURODESIS N/A 06/26/2019   Procedure: Pietro Cassis;  Surgeon: Gaye Pollack, MD;  Location: [facility];  Service: Thoracic;  Laterality: N/A;  . TONSILLECTOMY    . TRANSTHORACIC ECHOCARDIOGRAM  02-24-2013      mild LVH,  ef 50-55%/  AV bioprosthesis was present  with very mild stenosis and no regurg., mean grandient 63mHg, peak grandient 276mg /  mild MR/  mild LAE and RAE/  moderate TR  . TUBAL LIGATION       Family History  Problem Relation Age of Onset  . Heart disease Father   . Heart failure Father   . Diabetes Maternal Grandmother   . Heart disease Maternal Grandmother   . Diabetes Son   . Healthy Brother        #1  . Heart attack Brother        #2  . Heart disease Brother        #2  . Colon cancer Neg Hx   . Esophageal cancer Neg Hx      Social History   Socioeconomic History  . Marital status: Married    Spouse name: Not on file  . Number of children: 2  . Years of education: Not on file  . Highest education level: Not on file  Occupational History  . Occupation: retired  Tobacco Use  . Smoking status: Never Smoker  . Smokeless tobacco: Never Used  Vaping Use  . Vaping Use: Never used  Substance and Sexual Activity  . Alcohol use: No  . Drug use: No  . Sexual activity: Yes    Birth control/protection: None  Other Topics Concern  . Not on file  Social History Narrative   Right  handed    Lives at home with husband    Social Determinants of Health   Financial Resource Strain: Not on file  Food Insecurity: Not on file  Transportation Needs: Not on file  Physical Activity: Not on file  Stress: Not on file  Social Connections: Not on file  Intimate Partner Violence: Not on file     LMP  (LMP Unknown)   Physical Exam:  Stable appearing NAD HEENT: Unremarkable Neck:  No JVD, no thyromegally Lymphatics:  No adenopathy Back:  No CVA tenderness Lungs:  Clear with no wheezes HEART:  Regular rate rhythm, no murmurs, no rubs, no clicks Abd:  soft, positive bowel sounds, no organomegally, no rebound, no guarding Ext:  2 plus pulses, no edema, no cyanosis, no clubbing Skin:  No rashes no nodules Neuro:  CN II through XII intact, motor grossly intact  DEVICE  Normal device function.  See PaceArt for  details.   Assess/Plan: 1. Symptomatic tachy-brady syndrome - I have discussed the treatment options in detail with the patient and recommended PPM insertion. 2. Syncope - the etiology could be related to either too fast or too slow, autonomic dysfunction or other. Her EEG was negative.  3. CAD - she is s/p CABG but denies any anginal symptoms. We will follow. 4. PAF =- she is tolerating her systemic anti-coagulation. Once her PPM is in place we will have a better knowledge of her atrial arrhythmias.  Carleene Overlie Hannah Mathes,MD

## 2020-07-18 NOTE — Progress Notes (Signed)
HPI Hannah [name] returns today for followup of her atrial fib. She is a pleasant 75 yo woman, with HTN, s/p AVR who has had refractory atrial fib. She has been on amio and her dose was increased initially but when I saw her last she reduced her dose back to 200 mg daily.  Her symptoms are much improved. No chest pain or sob. No syncope. She has continued to have symptomatic bradycardia and her ILR demonstrates frequent episodes of HR's in the 30's during the daytime. She also has a h/o atrial fib with a RVR.  Allergies  Allergen Reactions  . Amoxicillin Rash and Other (See Comments)    Tolerates Cephalosporins Has patient had a PCN reaction causing immediate rash, facial/tongue/throat swelling, SOB or lightheadedness with hypotension: Yes Has patient had a PCN reaction causing severe rash involving mucus membranes or skin necrosis: Yes Has patient had a PCN reaction that required hospitalization No Has patient had a PCN reaction occurring within the last 10 years: No If all of the above answers are "NO", then may proceed with Cephalosporin use.   . Tape Other (See Comments)    Pulls off skin, must use paper tape  . Aldactone [Spironolactone] Other (See Comments)    CKD/hypokalemia  . Arimidex [Anastrozole] Nausea Only  . Latex Itching and Other (See Comments)    (Dentist office)  . Tetracycline Rash     Current Outpatient Medications  Medication Sig Dispense Refill  . Accu-Chek FastClix Lancets MISC Use to check blood sugar 3 times per day 100 each 5  . ACCU-CHEK GUIDE test strip Use to test blood sugar 3 times daily 150 strip 3  . acetaminophen (TYLENOL) 500 MG tablet Take 1,000 mg by mouth as needed for moderate pain.    Marland Kitchen albuterol (PROVENTIL) (2.5 MG/3ML) 0.083% nebulizer solution Take 3 mLs (2.5 mg total) by nebulization every 6 (six) hours as needed for wheezing or shortness of breath. 75 mL 3  . albuterol (VENTOLIN HFA) 108 (90 Base) MCG/ACT inhaler Inhale 2 puffs into  the lungs every 6 (six) hours as needed for wheezing or shortness of breath. 18 g 5  . amiodarone (PACERONE) 200 MG tablet TAKE ONE TABLET BY MOUTH DAILY (Patient taking differently: Take 200 mg by mouth daily.) 90 tablet 3  . azithromycin (ZITHROMAX) 250 MG tablet Zpack taper as directed 6 tablet 0  . Blood Glucose Monitoring Suppl (ACCU-CHEK GUIDE ME) w/Device KIT Use accu chek meter to check blood sugar three times daily. 1 kit 0  . cetirizine (ZYRTEC) 10 MG tablet Take 10 mg by mouth daily.    . Cholecalciferol (DIALYVITE VITAMIN D 5000) 125 MCG (5000 UT) capsule Take 5,000 Units by mouth daily.    Marland Kitchen ELIQUIS 2.5 MG TABS tablet Take 1 tablet (2.5 mg total) by mouth 2 (two) times daily. 180 tablet 1  . exemestane (AROMASIN) 25 MG tablet TAKE ONE TABLET BY MOUTH DAILY AFTER BREAKFAST (Patient taking differently: Take 25 mg by mouth daily after breakfast.) 90 tablet 3  . ezetimibe (ZETIA) 10 MG tablet TAKE ONE TABLET BY MOUTH EVERY DAY (Patient taking differently: Take 10 mg by mouth daily.) 90 tablet 3  . famotidine (PEPCID) 20 MG tablet Take 20 mg by mouth 2 (two) times daily.    . furosemide (LASIX) 20 MG tablet Take 1 tablet (20 mg total) by mouth daily. 30 tablet 3  . guaiFENesin (MUCINEX) 600 MG 12 hr tablet Take 1 tablet (600 mg total) by  mouth 2 (two) times daily as needed for cough. 30 tablet 0  . HUMALOG KWIKPEN 100 UNIT/ML KwikPen INJECT SUBCUTANEOUSLY 4 TO  6 UNITS 3 TIMES DAILY  BEFORE MEALS 30 mL 3  . insulin glargine (LANTUS) 100 UNIT/ML injection Inject 20 Units into the skin daily.     . Insulin Lispro (HUMALOG KWIKPEN [place]) Inject 6-8 Units into the skin 3 (three) times daily.     . Insulin Pen Needle (BD PEN NEEDLE NANO U/F) 32G X 4 MM MISC USE AS INSTRUCTED TO INJECT INSULIN 4 TIMES DAILY. 360 each 1  . metoprolol succinate (TOPROL XL) 25 MG 24 hr tablet Take 0.5 tablets (12.5 mg total) by mouth daily. 45 tablet 3  . midodrine (PROAMATINE) 2.5 MG tablet Take 2 tablets (5 mg total)  by mouth 3 (three) times daily with meals. 270 tablet 3  . nitroGLYCERIN (NITROSTAT) 0.4 MG SL tablet Place 1 tablet (0.4 mg total) under the tongue every 5 (five) minutes as needed for chest pain. 25 tablet 3  . ondansetron (ZOFRAN ODT) 4 MG disintegrating tablet Take 1 tablet (4 mg total) by mouth every 8 (eight) hours as needed. (Patient taking differently: Take 4 mg by mouth every 8 (eight) hours as needed for nausea or vomiting.) 10 tablet 0  . predniSONE (DELTASONE) 10 MG tablet Take 2 tabs x 5 days 10 tablet 0  . rosuvastatin (CRESTOR) 40 MG tablet Take 1 tablet (40 mg total) by mouth at bedtime. 90 tablet 3  . SYMBICORT 160-4.5 MCG/ACT inhaler INHALE TWO PUFFS INTO THE LUNGS TWICE DAILY 10.2 g 1  . TRULICITY 1.5 LT/9.0ZE SOPN INJECT THE CONTENTS OF ONE  PEN SUBCUTANEOUSLY WEEKLY  AS DIRECTED (Patient taking differently: Inject 1.5 mg into the skin once a week. Wednesday) 6 mL 3  . vitamin B-12 (CYANOCOBALAMIN) 100 MCG tablet Take 100 mcg by mouth daily.    Marland Kitchen zolpidem (AMBIEN) 5 MG tablet Take 5 mg by mouth at bedtime.     No current facility-administered medications for this visit.     Past Medical History:  Diagnosis Date  . Abnormally small mouth   . Allergic rhinitis 10/14/2009   Qualifier: Diagnosis of  By: Lamonte Sakai MD, Rose Fillers   Overview:  Overview:  Qualifier: Diagnosis of  By: Lamonte Sakai MD, Rose Fillers  Last Assessment & Plan:  Please continue Xyzal and Nasacort as you have been using them  . Anemia   . Asthma 05/12/2009   10/12/2014 p extensive coaching HFA effectiveness =    75% s spacer    Overview:  Overview:  10/12/2014 p extensive coaching HFA effectiveness =    75% s spacer   Last Assessment & Plan:  Please continue Symbicort 2 puffs twice a day. Remember to rinse and gargle after taking this medication. Take albuterol 2 puffs up to every 4 hours if needed for shortness of breath.  Follow with Dr Lamonte Sakai in 6 month  . Bilateral carotid artery stenosis    a. mild 1-39% by duplex 10/2017, due  2021.  . Breast cancer ([place]) 01/18/2016   right breast  . CAD (coronary artery disease) 01/24/2011   a. s/p CABG 1998 with redo 2009.  Marland Kitchen Chemotherapy induced nausea and vomiting 02/24/2016  . Chemotherapy-induced peripheral neuropathy ([place]) 04/27/2016  . 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 Chronic diastolic CHF (congestive heart failure) ([place]) 09/24/2013  . CKD (chronic kidney disease), stage IV ([place]) 09/24/2013  . Closed fracture of head of left humerus 09/09/2017  . Complication  of anesthesia   . Dysrhythmia    a-fib  . Gallstones   . GERD (gastroesophageal reflux disease)   . Glaucoma   . H/O atrial tachycardia 05/12/2009   Qualifier: History of  By: Lamonte Sakai MD, Rose Fillers   Overview:  Overview:  Qualifier: History of  By: Lamonte Sakai MD, Rose Fillers  Last Assessment & Plan:  There was no evidence of this on her cardiac monitor. I would recommend watchful waiting.   Marland Kitchen Heart murmur   . History of kidney stones   . History of non-ST elevation myocardial infarction (NSTEMI)    Sept 2014--  thought to be type II HTN w/ LHC without infarct related artery and patent grafts  . History of radiation therapy 05/24/16-07/26/16   right breast 50.4 Gy in 28 fractions, right breast boost 10 Gy in 5 fractions  . Hyperlipidemia   . Hypotension   . Iron deficiency anemia   . Mania ([place]) 03/05/2016  . Moderate persistent asthma    pulmologist-  Dr. Malvin Johns  . Osteomyelitis of toe of left foot ([place]) 04/06/2016  . Osteopenia of multiple sites 10/19/2015  . PAF (paroxysmal atrial fibrillation) ([place])   . Personal history of chemotherapy 2017  . Personal history of radiation therapy 2017  . PONV (postoperative nausea and vomiting)   . Port catheter in place 02/17/2016  . Psoriasis    right leg  . Renal calculus, right   . S/P AVR    prosthesis valve placement 2009 at same time re-do CABG  . Seizure-like activity ([place]) 09/01/2017  . Sensorineural hearing loss (SNHL) of both ears 01/06/2016  . Stroke  [facility]) 2014   residual rt hearing loss  . Syncope 09/06/2017  . Type 2 diabetes mellitus ([place])    monitored by dr Dwyane Dee    ROS:   All systems reviewed and negative except as noted in the HPI.   Past Surgical History:  Procedure Laterality Date  . AORTIC VALVE REPLACEMENT  2009   [facility]done same time is CABG)  . BREAST BIOPSY Right 02/12/2019   2 lymphnodes, 1 breast  . BREAST LUMPECTOMY Right 01/18/2016  . BREAST LUMPECTOMY WITH RADIOACTIVE SEED AND SENTINEL LYMPH NODE BIOPSY Right 01/18/2016   Procedure: RIGHT BREAST LUMPECTOMY WITH RADIOACTIVE SEED AND SENTINEL LYMPH NODE BIOPSY;  Surgeon: DAlphonsa Overall MD;  [place]  Service: General;  Laterality: Right;  . CARDIAC CATHETERIZATION  03/23/2008   Pre-redo CABG: L main OK, LAD (T), CFX (T), OM1 99%, RCA (T), LIMA-LAD OK, SVG-OM(?3) OK w/ little florw to OM2, SVG-RCA OK. EF NL  . CARPAL TUNNEL RELEASE    . CHEST TUBE INSERTION Right 06/26/2019   Procedure: INSERTION PLEURAL DRAINAGE CATHETER;  Surgeon: BGaye Pollack MD;  [facility]  Service: Thoracic;  Laterality: Right;  . CHOLECYSTECTOMY N/A 07/07/2018   Procedure: LAPAROSCOPIC CHOLECYSTECTOMY WITH INTRAOPERATIVE CHOLANGIOGRAM ERAS PATHWAY;  Surgeon: NAlphonsa Overall MD;  [facility];  Service: General;  Laterality: N/A;  . COLONOSCOPY     around 2015. Possibly with [facility]   . CORONARY ARTERY BYPASS GRAFT  1998 &  re-do 2009   Had LIMA to DX/LAD, SVG [facility] 2 marginal [facility]; SVG to 3rd OM and PD at time of redo  . CYSTOSCOPY W/ URETERAL STENT PLACEMENT Right 12/20/2014   Procedure: CYSTOSCOPY WITH RETROGRADE PYELOGRAM/URETERAL STENT PLACEMENT;  Surgeon: PCleon Gustin MD;  [facility]  Service: Urology;  Laterality: Right;  . CYSTOSCOPY WITH RETROGRADE PYELOGRAM, URETEROSCOPY AND STENT PLACEMENT Left 08/21/2019   Procedure: CYSTOSCOPY WITH RETROGRADE PYELOGRAM, URETEROSCOPY AND  STENT PLACEMENT;  Surgeon: Cleon Gustin, MD;  [facility];  Service: Urology;  Laterality: Left;  1 HR  . ESOPHAGOGASTRODUODENOSCOPY     many years ago per patient   . ESOPHAGOGASTRODUODENOSCOPY ENDOSCOPY  06/17/2018  . EYE SURGERY Bilateral    cataracts  . HOLMIUM LASER APPLICATION Right 7/67/3419   Procedure:  HOLMIUM LASER LITHOTRIPSY;  Surgeon: Cleon Gustin, MD;  Location[facility];  Service: Urology;  Laterality: Right;  . HOLMIUM LASER APPLICATION Left 3/79/0240   Procedure: HOLMIUM LASER APPLICATION;  Surgeon: Cleon Gustin, MD;  [facility];  Service: Urology;  Laterality: Left;  . IR KYPHO LUMBAR INC FX REDUCE BONE BX UNI/BIL CANNULATION INC/IMAGING  02/12/2020  . IR THORACENTESIS ASP PLEURAL SPACE W/IMG GUIDE  10/03/2018  . IR THORACENTESIS ASP PLEURAL SPACE W/IMG GUIDE  06/08/2019  . LEFT HEART CATHETERIZATION WITH CORONARY/GRAFT ANGIOGRAM N/A 02/23/2013   Procedure: LEFT HEART CATHETERIZATION WITH Beatrix Fetters;  Surgeon: Blane Ohara, MD;  Location: [facility] CATH LAB;  Service: Cardiovascular;  Laterality: N/A;  . LOOP RECORDER INSERTION N/A 08/30/2017   Procedure: LOOP RECORDER INSERTION;  Surgeon: Evans Lance, MD;  Location: [place] CV LAB;  Service: Cardiovascular;  Laterality: N/A;  . PORTACATH PLACEMENT Left 01/18/2016   Procedure: INSERTION PORT-A-CATH;  Surgeon: Alphonsa Overall, MD;  Location: [place];  Service: General;  Laterality: Left;  . portacath removal    . REMOVAL OF PLEURAL DRAINAGE CATHETER Right 07/14/2019   Procedure: REMOVAL OF PLEURAL DRAINAGE CATHETER;  Surgeon: Gaye Pollack, MD;  Location: [place];  Service: Thoracic;  Laterality: Right;  . TALC PLEURODESIS N/A 06/26/2019   Procedure: Pietro Cassis;  Surgeon: Gaye Pollack, MD;  [facility];  Service: Thoracic;  Laterality: N/A;  . TONSILLECTOMY    . TRANSTHORACIC ECHOCARDIOGRAM  02-24-2013      mild LVH,  ef 50-55%/  AV bioprosthesis was present  with very mild stenosis and no regurg., mean grandient 76mHg, peak grandient 264mg /  mild MR/  mild LAE and RAE/  moderate TR  . TUBAL LIGATION       Family History  Problem Relation Age of Onset  . Heart disease Father   . Heart failure Father   . Diabetes Maternal Grandmother   . Heart disease Maternal Grandmother   . Diabetes Son   . Healthy Brother        #1  . Heart attack Brother        #2  . Heart disease Brother        #2  . Colon cancer Neg Hx   . Esophageal cancer Neg Hx      Social History   Socioeconomic History  . Marital status: Married    Spouse name: Not on file  . Number of children: 2  . Years of education: Not on file  . Highest education level: Not on file  Occupational History  . Occupation: retired  Tobacco Use  . Smoking status: Never Smoker  . Smokeless tobacco: Never Used  Vaping Use  . Vaping Use: Never used  Substance and Sexual Activity  . Alcohol use: No  . Drug use: No  . Sexual activity: Yes    Birth control/protection: None  Other Topics Concern  . Not on file  Social History Narrative   Right  handed    Lives at home with husband    Social Determinants of Health   Financial Resource Strain: Not on file  Food Insecurity: Not on file  Transportation Needs: Not on file  Physical Activity: Not on file  Stress: Not on file  Social Connections: Not on file  Intimate Partner Violence: Not on file     LMP  (LMP Unknown)   Physical Exam:  Stable appearing NAD HEENT: Unremarkable Neck:  No JVD, no thyromegally Lymphatics:  No adenopathy Back:  No CVA tenderness Lungs:  Clear with no wheezes HEART:  Regular rate rhythm, no murmurs, no rubs, no clicks Abd:  soft, positive bowel sounds, no organomegally, no rebound, no guarding Ext:  2 plus pulses, no edema, no cyanosis, no clubbing Skin:  No rashes no nodules Neuro:  CN II through XII intact, motor grossly intact  DEVICE  Normal device function.  See PaceArt for  details.   Assess/Plan: 1. Symptomatic tachy-brady syndrome - I have discussed the treatment options in detail with the patient and recommended PPM insertion. 2. Syncope - the etiology could be related to either too fast or too slow, autonomic dysfunction or other. Her EEG was negative.  3. CAD - she is s/p CABG but denies any anginal symptoms. We will follow. 4. PAF =- she is tolerating her systemic anti-coagulation. Once her PPM is in place we will have a better knowledge of her atrial arrhythmias.  Hannah Overlie Travian Kerner,MD

## 2020-07-18 NOTE — Patient Instructions (Signed)
Medication Instructions:  Your physician recommends that you continue on your current medications as directed. Please refer to the Current Medication list given to you today.  *If you need a refill on your cardiac medications before your next appointment, please call your pharmacy*   Lab Work: BMET and CBC today  If you have labs (blood work) drawn today and your tests are completely normal, you will receive your results only by: Marland Kitchen MyChart Message (if you have MyChart) OR . A paper copy in the mail If you have any lab test that is abnormal or we need to change your treatment, we will call you to review the results.   Testing/Procedures:  Medication Instructions:  Your physician recommends that you continue on your current medications as directed. Please refer to the Current Medication list given to you today.     * If you need a refill on your cardiac medications before your next appointment, please call your pharmacy. *   Labwork: Pre procedure lab work today: BMET & CBC w/ diff   Testing/Procedures: Your physician has recommended that you have a pacemaker inserted. A pacemaker is a small device that is placed under the skin of your chest or abdomen to help control abnormal heart rhythms. This device uses electrical pulses to prompt the heart to beat at a normal rate. Pacemakers are used to treat heart rhythms that are too slow. Wire (leads) are attached to the pacemaker that goes into the chambers of you heart. This is done in the hospital and usually requires and overnight stay. Please follow the instructions below, located under the special instructions section.   Follow-Up: At [facility], you and your health needs are our priority.  As part of our continuing mission to provide you with exceptional heart care, we have created designated Provider Care Teams.  These Care Teams include your primary Cardiologist (physician) and Advanced Practice Providers (APPs -  Physician  Assistants and Nurse Practitioners) who all work together to provide you with the care you need, when you need it.  We recommend signing up for the patient portal called "MyChart".  Sign up information is provided on this After Visit Summary.  MyChart is used to connect with patients for Virtual Visits (Telemedicine).  Patients are able to view lab/test results, encounter notes, upcoming appointments, etc.  Non-urgent messages can be sent to your provider as well.   To learn more about what you can do with MyChart, go to [email or web address].    Your next appointment:  Per [facility] will call you

## 2020-07-19 LAB — CUP PACEART INCLINIC DEVICE CHECK
Date Time Interrogation Session: 20220207115600
Implantable Pulse Generator Implant Date: 20190322

## 2020-07-22 ENCOUNTER — Other Ambulatory Visit: Payer: Self-pay

## 2020-07-22 ENCOUNTER — Inpatient Hospital Stay: Payer: Medicare Other

## 2020-07-22 ENCOUNTER — Inpatient Hospital Stay: Payer: Medicare Other | Attending: Adult Health

## 2020-07-22 VITALS — BP 110/61 | HR 72 | Temp 98.5°F | Resp 17 | Ht 64.0 in | Wt 127.1 lb

## 2020-07-22 DIAGNOSIS — I129 Hypertensive chronic kidney disease with stage 1 through stage 4 chronic kidney disease, or unspecified chronic kidney disease: Secondary | ICD-10-CM | POA: Diagnosis present

## 2020-07-22 DIAGNOSIS — N189 Chronic kidney disease, unspecified: Secondary | ICD-10-CM | POA: Insufficient documentation

## 2020-07-22 DIAGNOSIS — D631 Anemia in chronic kidney disease: Secondary | ICD-10-CM | POA: Insufficient documentation

## 2020-07-22 DIAGNOSIS — Z79899 Other long term (current) drug therapy: Secondary | ICD-10-CM | POA: Diagnosis not present

## 2020-07-22 DIAGNOSIS — D638 Anemia in other chronic diseases classified elsewhere: Secondary | ICD-10-CM

## 2020-07-22 DIAGNOSIS — Z170 Estrogen receptor positive status [ER+]: Secondary | ICD-10-CM

## 2020-07-22 DIAGNOSIS — C50211 Malignant neoplasm of upper-inner quadrant of right female breast: Secondary | ICD-10-CM

## 2020-07-22 LAB — CMP (CANCER CENTER ONLY)
ALT: 117 U/L — ABNORMAL HIGH (ref 0–44)
AST: 84 U/L — ABNORMAL HIGH (ref 15–41)
Albumin: 2.8 g/dL — ABNORMAL LOW (ref 3.5–5.0)
Alkaline Phosphatase: 78 U/L (ref 38–126)
Anion gap: 5 (ref 5–15)
BUN: 49 mg/dL — ABNORMAL HIGH (ref 8–23)
CO2: 22 mmol/L (ref 22–32)
Calcium: 8.7 mg/dL — ABNORMAL LOW (ref 8.9–10.3)
Chloride: 110 mmol/L (ref 98–111)
Creatinine: 2.83 mg/dL — ABNORMAL HIGH (ref 0.44–1.00)
GFR, Estimated: 17 mL/min — ABNORMAL LOW (ref >60)
Glucose, Bld: 237 mg/dL — ABNORMAL HIGH (ref 70–99)
Potassium: 4.8 mmol/L (ref 3.5–5.1)
Sodium: 137 mmol/L (ref 135–145)
Total Bilirubin: 0.6 mg/dL (ref 0.3–1.2)
Total Protein: 6.2 g/dL — ABNORMAL LOW (ref 6.5–8.1)

## 2020-07-22 LAB — CBC WITH DIFFERENTIAL (CANCER CENTER ONLY)
Abs Immature Granulocytes: 0.03 10*3/uL (ref 0.00–0.07)
Basophils Absolute: 0 10*3/uL (ref 0.0–0.1)
Basophils Relative: 0 %
Eosinophils Absolute: 0.1 10*3/uL (ref 0.0–0.5)
Eosinophils Relative: 1 %
HCT: 35.2 % — ABNORMAL LOW (ref 36.0–46.0)
Hemoglobin: 10.4 g/dL — ABNORMAL LOW (ref 12.0–15.0)
Immature Granulocytes: 0 %
Lymphocytes Relative: 10 %
Lymphs Abs: 0.8 10*3/uL (ref 0.7–4.0)
MCH: 29.5 pg (ref 26.0–34.0)
MCHC: 29.5 g/dL — ABNORMAL LOW (ref 30.0–36.0)
MCV: 100 fL (ref 80.0–100.0)
Monocytes Absolute: 0.7 10*3/uL (ref 0.1–1.0)
Monocytes Relative: 9 %
Neutro Abs: 6.2 10*3/uL (ref 1.7–7.7)
Neutrophils Relative %: 80 %
Platelet Count: 150 10*3/uL (ref 150–400)
RBC: 3.52 MIL/uL — ABNORMAL LOW (ref 3.87–5.11)
RDW: 13.7 % (ref 11.5–15.5)
WBC Count: 7.7 10*3/uL (ref 4.0–10.5)
nRBC: 0 % (ref 0.0–0.2)

## 2020-07-22 LAB — SAMPLE TO BLOOD BANK

## 2020-07-22 LAB — RETIC PANEL
Immature Retic Fract: 6.6 % (ref 2.3–15.9)
RBC.: 3.51 MIL/uL — ABNORMAL LOW (ref 3.87–5.11)
Retic Count, Absolute: 33 10*3/uL (ref 19.0–186.0)
Retic Ct Pct: 0.9 % (ref 0.4–3.1)
Reticulocyte Hemoglobin: 36 pg (ref >27.9)

## 2020-07-22 LAB — IRON AND TIBC
Iron: 103 ug/dL (ref 41–142)
Saturation Ratios: 43 % (ref 21–57)
TIBC: 236 ug/dL (ref 236–444)
UIBC: 134 ug/dL (ref 120–384)

## 2020-07-22 LAB — FERRITIN: Ferritin: 205 ng/mL (ref 11–307)

## 2020-07-22 MED ORDER — DARBEPOETIN ALFA 500 MCG/ML IJ SOSY
PREFILLED_SYRINGE | INTRAMUSCULAR | Status: AC
  Filled 2020-07-22: qty 1

## 2020-07-22 MED ORDER — DARBEPOETIN ALFA 500 MCG/ML IJ SOSY
500.0000 ug | PREFILLED_SYRINGE | Freq: Once | INTRAMUSCULAR | Status: AC
  Administered 2020-07-22: 500 ug via SUBCUTANEOUS

## 2020-07-25 ENCOUNTER — Telehealth: Payer: Self-pay

## 2020-07-25 ENCOUNTER — Other Ambulatory Visit (HOSPITAL_COMMUNITY): Payer: Self-pay | Admitting: *Deleted

## 2020-07-25 MED ORDER — MIDODRINE HCL 5 MG PO TABS: 5.0000 mg | ORAL_TABLET | Freq: Three times a day (TID) | ORAL | 3 refills | Status: DC

## 2020-07-25 NOTE — Telephone Encounter (Signed)
Dr. Lovena Le requested a manual to see if symptoms correlate with time of events. Patient reports of 2 syncopal events today (doe correlate w/ time). Denies any injury, states her husband stays with her and he is able to tell when an event is about to happen and will help her to floor prior to falling. Spoke to Dr. Lovena Le and advised that patient needs to have PPM implant moved up to this Wednesday 07/27/2020 (time will be decided at later date). Patient is HOLD Eliquis at this time and not take any further doses. Patient is also advised NPO after 7 am on Wednesday 07/27/2020. Patient call and advised of all information. Questions answered.   Advised patient someone will be in contact with her in regards to a time of procedure. Verbalized understanding.  Reviewed ED precautions w/ verbal understanding.

## 2020-07-25 NOTE — Telephone Encounter (Addendum)
Spoke with pt who reports she has passed out twice last night and once this morning while using the bathroom.  Pt has been recently diagnosed with tachy-brady syndrome and is scheduled for a pacemaker 08/2020.  Pt is asking if she can have procedure moved up due to frequency of episodes.  Pt denies current CP or SOB.  Pt reports she is feeling fine now and is without complaints.  Will send to device clinic to review Linq remote transmission.

## 2020-07-25 NOTE — Telephone Encounter (Signed)
I asked the patient to send a manual transmission. The patient states she had some pain on and off on the right side. The patient states she passed out a couple of times at night and today in the around 12 o'clock. I asked her did she hit her head? She states No her husband caught her. I let her speak with Leigh, rn.

## 2020-07-26 ENCOUNTER — Other Ambulatory Visit (HOSPITAL_COMMUNITY)
Admission: RE | Admit: 2020-07-26 | Discharge: 2020-07-26 | Disposition: A | Discharge status: Home / Self Care | Payer: Medicare Other | Source: Ambulatory Visit | Attending: Internal Medicine | Admitting: Internal Medicine

## 2020-07-26 DIAGNOSIS — U071 COVID-19: Secondary | ICD-10-CM | POA: Insufficient documentation

## 2020-07-26 DIAGNOSIS — Z01812 Encounter for preprocedural laboratory examination: Secondary | ICD-10-CM | POA: Insufficient documentation

## 2020-07-26 LAB — SARS CORONAVIRUS 2 (TAT 6-24 HRS): SARS Coronavirus 2: POSITIVE — AB

## 2020-07-26 NOTE — Progress Notes (Signed)
Spoke with patient regarding procedure instructions for tomorrow.  We have made a change to the schedule.   Arrival time is 8:00.  Nothing to eat or drink after midnight.  You need responsible adult to drive you home tomorrow as well as stay over night with you.  Wash with the soap tonight and in the morning.  Follow the instructions regarding what medications to take and hold that office went over with you.

## 2020-07-26 NOTE — Telephone Encounter (Signed)
Spoke with pt and reviewed instructions for PPM implant scheduled with Dr Lovena Le on 07/27/2020.  See letter for complete details.  Copy of letter sent to pt through [facility].  Pt verbalizes understanding of all instructions given and agrees with current plan.

## 2020-07-26 NOTE — Telephone Encounter (Signed)
Sinus brady reviewed. I suspect a vagal episode with cardiac inhibition. Agree with plans for PPM. GT

## 2020-07-27 ENCOUNTER — Encounter (HOSPITAL_COMMUNITY): Admission: RE | Payer: Medicare Other | Source: Home / Self Care

## 2020-07-27 ENCOUNTER — Encounter (HOSPITAL_COMMUNITY): Payer: Self-pay

## 2020-07-27 ENCOUNTER — Telehealth: Payer: Self-pay | Admitting: Family

## 2020-07-27 ENCOUNTER — Ambulatory Visit (HOSPITAL_COMMUNITY): Admission: RE | Admit: 2020-07-27 | Payer: Medicare Other | Source: Home / Self Care | Admitting: Internal Medicine

## 2020-07-27 SURGERY — PACEMAKER IMPLANT

## 2020-07-27 NOTE — Telephone Encounter (Signed)
Called to discuss with patient about COVID-19 symptoms and the use of one of the available treatments for those with mild to moderate Covid symptoms and at a high risk of hospitalization.  Pt appears to qualify for outpatient treatment due to co-morbid conditions and/or a member of an at-risk group in accordance with the FDA Emergency Use Authorization.    Symptom onset: >2 weeks ago  Tells me 2 weeks ago on February 4th she was treated for asthma/bronchitits. Tells me she did not have a COVID test at that time. She wonders if that previous infection was COVID19. She reports all of her symptoms have resolved. No indication for treatment at this time as she is asymptomatic and outside of window.   Hannah [name]

## 2020-07-27 NOTE — Progress Notes (Signed)
Patient had pre procedure covid test done yesterday 2/15.  The results are +.  I have informed the patient.  She will need to quarantine for 10 days after + results.  She states she is not currently having any symptoms.  States she was sick back in January, they treated her for bronchitis  Office will call her to reschedule her.

## 2020-07-29 ENCOUNTER — Ambulatory Visit: Payer: Medicare Other | Admitting: Endocrinology

## 2020-07-31 ENCOUNTER — Encounter (HOSPITAL_COMMUNITY): Payer: Self-pay

## 2020-08-01 ENCOUNTER — Ambulatory Visit (INDEPENDENT_AMBULATORY_CARE_PROVIDER_SITE_OTHER): Payer: Medicare Other

## 2020-08-01 DIAGNOSIS — R55 Syncope and collapse: Secondary | ICD-10-CM | POA: Diagnosis not present

## 2020-08-02 ENCOUNTER — Encounter (HOSPITAL_COMMUNITY): Payer: Self-pay | Admitting: Cardiology

## 2020-08-02 ENCOUNTER — Other Ambulatory Visit: Payer: Self-pay

## 2020-08-02 ENCOUNTER — Ambulatory Visit (HOSPITAL_COMMUNITY)
Admission: RE | Admit: 2020-08-02 | Discharge: 2020-08-02 | Disposition: A | Discharge status: Home / Self Care | Payer: Medicare Other | Source: Ambulatory Visit | Attending: Cardiology | Admitting: Cardiology

## 2020-08-02 VITALS — BP 130/60 | HR 64 | Wt 131.4 lb

## 2020-08-02 DIAGNOSIS — I5032 Chronic diastolic (congestive) heart failure: Secondary | ICD-10-CM | POA: Diagnosis present

## 2020-08-02 DIAGNOSIS — E785 Hyperlipidemia, unspecified: Secondary | ICD-10-CM | POA: Insufficient documentation

## 2020-08-02 DIAGNOSIS — Z951 Presence of aortocoronary bypass graft: Secondary | ICD-10-CM | POA: Diagnosis not present

## 2020-08-02 DIAGNOSIS — Z7901 Long term (current) use of anticoagulants: Secondary | ICD-10-CM | POA: Diagnosis not present

## 2020-08-02 DIAGNOSIS — E859 Amyloidosis, unspecified: Secondary | ICD-10-CM

## 2020-08-02 DIAGNOSIS — N183 Chronic kidney disease, stage 3 unspecified: Secondary | ICD-10-CM | POA: Diagnosis not present

## 2020-08-02 DIAGNOSIS — Z794 Long term (current) use of insulin: Secondary | ICD-10-CM | POA: Insufficient documentation

## 2020-08-02 DIAGNOSIS — Z7951 Long term (current) use of inhaled steroids: Secondary | ICD-10-CM | POA: Diagnosis not present

## 2020-08-02 DIAGNOSIS — I495 Sick sinus syndrome: Secondary | ICD-10-CM | POA: Insufficient documentation

## 2020-08-02 DIAGNOSIS — I130 Hypertensive heart and chronic kidney disease with heart failure and stage 1 through stage 4 chronic kidney disease, or unspecified chronic kidney disease: Secondary | ICD-10-CM | POA: Diagnosis not present

## 2020-08-02 DIAGNOSIS — E1122 Type 2 diabetes mellitus with diabetic chronic kidney disease: Secondary | ICD-10-CM | POA: Insufficient documentation

## 2020-08-02 DIAGNOSIS — R55 Syncope and collapse: Secondary | ICD-10-CM | POA: Insufficient documentation

## 2020-08-02 DIAGNOSIS — I480 Paroxysmal atrial fibrillation: Secondary | ICD-10-CM

## 2020-08-02 DIAGNOSIS — Z8616 Personal history of COVID-19: Secondary | ICD-10-CM | POA: Diagnosis not present

## 2020-08-02 DIAGNOSIS — Z79899 Other long term (current) drug therapy: Secondary | ICD-10-CM | POA: Diagnosis not present

## 2020-08-02 DIAGNOSIS — Z953 Presence of xenogenic heart valve: Secondary | ICD-10-CM | POA: Insufficient documentation

## 2020-08-02 DIAGNOSIS — I2510 Atherosclerotic heart disease of native coronary artery without angina pectoris: Secondary | ICD-10-CM | POA: Diagnosis not present

## 2020-08-02 LAB — COMPREHENSIVE METABOLIC PANEL
ALT: 35 U/L (ref 0–44)
AST: 32 U/L (ref 15–41)
Albumin: 2.8 g/dL — ABNORMAL LOW (ref 3.5–5.0)
Alkaline Phosphatase: 93 U/L (ref 38–126)
Anion gap: 9 (ref 5–15)
BUN: 33 mg/dL — ABNORMAL HIGH (ref 8–23)
CO2: 25 mmol/L (ref 22–32)
Calcium: 9.2 mg/dL (ref 8.9–10.3)
Chloride: 106 mmol/L (ref 98–111)
Creatinine, Ser: 2.89 mg/dL — ABNORMAL HIGH (ref 0.44–1.00)
GFR, Estimated: 17 mL/min — ABNORMAL LOW (ref >60)
Glucose, Bld: 197 mg/dL — ABNORMAL HIGH (ref 70–99)
Potassium: 5 mmol/L (ref 3.5–5.1)
Sodium: 140 mmol/L (ref 135–145)
Total Bilirubin: 0.6 mg/dL (ref 0.3–1.2)
Total Protein: 6.1 g/dL — ABNORMAL LOW (ref 6.5–8.1)

## 2020-08-02 LAB — TSH: TSH: 3.247 u[IU]/mL (ref 0.350–4.500)

## 2020-08-02 NOTE — Progress Notes (Signed)
Patient ID: Hannah [name], female   DOB: March 11, 1946, 75 y.o.   MRN: [id number] PCP: Dr. Suzy Bouchard Cardiology: Dr. Aundra Dubin  75 y.o. with history of CAD s/p CABG, bioprosthetic AVR, CKD, paroxysmal atrial fibrillation presents for cardiology followup.  She had her initial CABG in 1998, then had redo CABG in 2009 with bioprosthetic AVR.    In 9/14, she developed tachypalpitations and later chest pain.  In the ER, SBP was in the 200s with elevated HR. Troponin was mildly elevated.  Therefore, she had a cardiac cath showing patent grafts.  She was thought to have had demand ischemia in the setting of hypertensive emergency + tachycardia.  After discharge, she continued to have runs of tachypalpitations.  A 3 week event monitor was put on her.  This showed runs, sometimes long, of atrial tachycardia. She was started on Toprol XL when these runs were noted.  No further palpitations (complete resolution of symptoms).    In 1/15, she was admitted to [facility] with atypical chest pain.  She was ruled out for MI and Lexiscan Cardiolite showed no ischemia.  She was started on Imdur and sent home.  After discharge, she developed a daily headache and nausea.  She did not eat or drink much due to the nausea. Shortly after this, she was standing in her grandson's pediatrician's office and became severely lightheaded.  She had to sit down.  She came to the ER and was found to be orthostatic.  She was given IV fluid and got better quickly.  I had her stop Imdur and symptoms seem to have completely resolved.    Event monitor in 8/16 showed atrial tachycardia versus atypical atrial flutter.  Toprol XL was increased.  She saw Dr Lovena Le who thought that she had short runs of atrial tachycardia and not atrial flutter.  Subsequently, she has been found to have paroxysmal atrial fibrillation and is on Eliquis.     Last echo in 11/21 showed EF 55-60%, mild LVH, normal RV, normal bioprosthetic aortic valve.  Cardiolite in 11/21 showed  no ischemia/infarction.   She has problems with anemia and has had multiple transfusions.  However, it has been a number of months since she required transfusion. She is getting Aranesp.   She has had significant problems with orthostatic intolerance and syncope.  She is taking midodrine now.  Last syncopal episode was in 12/21.  She had a loop recorder placed which has recently shown HR to 30s at times when she has symptomatic lightheadedness.    She returns for followup of diastolic CHF, paroxysmal atrial fibrillation.  She recently had COVID-19 infection but was asymptomatic (tested positive pre-op for PPM placement).  She is taking Lasix rarely.  Still gets weak/dizzy spells, 6 in the last 2 wks.  These are associated with bradycardia.  Toprol XL was decreased to 12.5 mg daily.  No recent loss of consciousness.  She is scheduled to have PPM placed on 3/2.  Still short of breath and tired walking more than 50 feet, occasional atypical chest pain. Creatinine has been running higher recently and LFTs have been mildly elevated.    ECG: NSR, LAFB, poor RWP (personally reviewed)  Labs (2/13): BNP 80, K 5.3, creatinine 1.1 Labs (6/13): LDL 76, HDL 41 Labs (8/13): K 4.3, creatinine 1.38 Labs (9/13): creatinine 1.6, proBNP 128 Labs (9/14): K 4.7, creatinine 1.5, LDL 82, HDL 35 Labs (10/14): K 4.8, creatinine 1.5 Labs (1/15): LDL 105, HDL 37 Labs (2/15): K 4.1, creatinine 1.28,  HCT 34 Labs (4/15): K 4.2, creatinine 1.5 => 1.4, BNP 95 Labs (1/16): K 3.8, creatinine 1.6, Hgb 10.5 Labs (7/16): K 3.7, creatinine 1.77, LDL 49, HDL 27 Labs (1/17): LDL 58, HDL 38, HCT 35.4 Labs (4/17): K 4.5, creatinine 1.47 Labs (1/22): K 4.3, creatinine 2.03, hgb 9.7, BNP 318 Labs (2/22): K 4.8, creatinine 2.83, hgb 10.4, AST 84, ALT 117  PMH: 1. Diabetes mellitus 2. HTN 3. Obesity 4. Atrial arrhythmias: Atrial tachycardia and paroxysmal atrial fibrillation.    5. CAD: CABG 1998 with LIMA-LAD/diagonal, sequential  SVG-OM1/OM2, SVG-RCA.  Redo CABG 2009 with SVG-OM3, SVG-PDA. Lexiscan myoview (9/13): EF 61%, no ischemia or infarction.  LHC (10/14) with all grafts patent except the old SVG-RCA.  There was 90% stenosis in the AV LCx before ungrafted MOM, but this was unchanged from prior study.  Lexiscan Cardiolite (1/15) with no ischemia. She does not tolerate Imdur.  - Cardiolite (11/21): EF 61%, no ischemia/infarction.  6. CKD 7. Bioprosthetic aortic valve: Echo (2011) with EF 50-55%, bioprosthetic aortic valve well-seated.  Echo (2/13) with EF 60-65%, bioprosthetic aortic valve looked ok.  Echo (9/14): EF 50-55%, mild LVH, bioprosthetic AVR with mean gradient 10, mild MR, moderate TR.  - Echo in 11/21 with stable bioprosthetic aortic valve.  8. Asthma: since her teens. 9. Low back pain.  10. 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  Per neurologist, did not appear cardioembolic.   11. Carotid stenosis: 40-59% bilateral ICA stenosis on 10/14 carotid dopplers. Carotid doppler (11/15) with 40-59% bilateral ICA stenosis.  Carotid dopplers (11/16) with 40-59% BICA stenosis.  - Carotid dopplers (8/21): 1-39% BICA stenosis.  12. Sleep study 12/14 without significant OSA.  13. Chronic diastolic CHF - Echo (91/50): EF 55-60%, mild LVH, RV normal, bioprosthetic aortic valve with mean gradient 8 mmHg and no AI.  14. Breast cancer: Diagnosed 2017.  She had chemotherapy and right lumpectomy.  15. Anemia of renal disease 16. Tachy-brady syndrome: Symptomatic bradycardia.  107. COVID-19 infection 2/21  SH: Never smoked.  Lives in [place].  Married.    FH: No premature CAD.   ROS: All systems reviewed and negative except as per HPI.    Current Outpatient Medications  Medication Sig Dispense Refill  . Accu-Chek FastClix Lancets MISC Use to check blood sugar 3 times per day 100 each 5  . ACCU-CHEK GUIDE test strip Use to test blood sugar 3 times daily 150 strip 3  . acetaminophen (TYLENOL) 500 MG tablet Take  1,000 mg by mouth as needed for moderate pain.    Marland Kitchen albuterol (PROVENTIL) (2.5 MG/3ML) 0.083% nebulizer solution Take 3 mLs (2.5 mg total) by nebulization every 6 (six) hours as needed for wheezing or shortness of breath. 75 mL 3  . albuterol (VENTOLIN HFA) 108 (90 Base) MCG/ACT inhaler Inhale 2 puffs into the lungs every 6 (six) hours as needed for wheezing or shortness of breath. 18 g 5  . amiodarone (PACERONE) 200 MG tablet TAKE ONE TABLET BY MOUTH DAILY (Patient taking differently: Take 200 mg by mouth daily.) 90 tablet 3  . Blood Glucose Monitoring Suppl (ACCU-CHEK GUIDE ME) w/Device KIT Use accu chek meter to check blood sugar three times daily. 1 kit 0  . cetirizine (ZYRTEC) 10 MG tablet Take 10 mg by mouth daily.    . Cholecalciferol (DIALYVITE VITAMIN D 5000) 125 MCG (5000 UT) capsule Take 5,000 Units by mouth daily.    Marland Kitchen ELIQUIS 2.5 MG TABS tablet Take 1 tablet (2.5 mg  total) by mouth 2 (two) times daily. (Patient taking differently: Take 2.5 mg by mouth 2 (two) times daily.) 180 tablet 1  . exemestane (AROMASIN) 25 MG tablet TAKE ONE TABLET BY MOUTH DAILY AFTER BREAKFAST (Patient taking differently: Take 25 mg by mouth daily after breakfast.) 90 tablet 3  . ezetimibe (ZETIA) 10 MG tablet TAKE ONE TABLET BY MOUTH EVERY DAY (Patient taking differently: Take 10 mg by mouth daily.) 90 tablet 3  . famotidine (PEPCID) 20 MG tablet Take 20 mg by mouth daily as needed for heartburn or indigestion.    . furosemide (LASIX) 20 MG tablet Take 20 mg by mouth daily as needed for fluid.    Marland Kitchen HUMALOG KWIKPEN 100 UNIT/ML KwikPen INJECT SUBCUTANEOUSLY 4 TO  6 UNITS 3 TIMES DAILY  BEFORE MEALS (Patient taking differently: Inject 6-8 Units into the skin 3 (three) times daily.) 30 mL 3  . insulin glargine (LANTUS) 100 UNIT/ML injection Inject 20 Units into the skin daily.     . Insulin Pen Needle (BD PEN NEEDLE NANO U/F) 32G X 4 MM MISC USE AS INSTRUCTED TO INJECT INSULIN 4 TIMES DAILY. 360 each 1  . metoprolol  succinate (TOPROL XL) 25 MG 24 hr tablet Take 0.5 tablets (12.5 mg total) by mouth daily. 45 tablet 3  . midodrine (PROAMATINE) 5 MG tablet Take 1 tablet (5 mg total) by mouth 3 (three) times daily with meals. 270 tablet 3  . nitroGLYCERIN (NITROSTAT) 0.4 MG SL tablet Place 1 tablet (0.4 mg total) under the tongue every 5 (five) minutes as needed for chest pain. 25 tablet 3  . ondansetron (ZOFRAN ODT) 4 MG disintegrating tablet Take 1 tablet (4 mg total) by mouth every 8 (eight) hours as needed. (Patient taking differently: Take 4 mg by mouth every 8 (eight) hours as needed for nausea or vomiting.) 10 tablet 0  . rosuvastatin (CRESTOR) 40 MG tablet Take 1 tablet (40 mg total) by mouth at bedtime. 90 tablet 3  . SYMBICORT 160-4.5 MCG/ACT inhaler INHALE TWO PUFFS INTO THE LUNGS TWICE DAILY (Patient taking differently: Inhale 2 puffs into the lungs in the morning and at bedtime.) 10.2 g 1  . TRULICITY 1.5 ZO/1.0RU SOPN INJECT THE CONTENTS OF ONE  PEN SUBCUTANEOUSLY WEEKLY  AS DIRECTED (Patient taking differently: Inject 1.5 mg into the skin every 7 (seven) days.) 6 mL 3  . vitamin B-12 (CYANOCOBALAMIN) 100 MCG tablet Take 100 mcg by mouth daily.    Marland Kitchen zolpidem (AMBIEN) 5 MG tablet Take 7.5 mg by mouth at bedtime as needed for sleep.     No current facility-administered medications for this encounter.    BP 130/60   Pulse 64   Wt 59.6 kg (131 lb 6.4 oz)   LMP  (LMP Unknown)   SpO2 99%   BMI 22.55 kg/m  General: NAD Neck: No JVD, no thyromegaly or thyroid nodule.  Lungs: Clear to auscultation bilaterally with normal respiratory effort. CV: Nondisplaced PMI.  Heart regular S1/S2, no S3/S4, 2/6 SEM RUSB.  No peripheral edema.  No carotid bruit.  Normal pedal pulses.  Abdomen: Soft, nontender, no hepatosplenomegaly, no distention.  Skin: Intact without lesions or rashes.  Neurologic: Alert and oriented x 3.  Psych: Normal affect. Extremities: No clubbing or cyanosis.  HEENT: Normal.    Assessment/Plan: 1. CAD: Admission in 9/14 with mild troponin elevation.  LHC showed stable anatomy with patent grafts except for SVG-RCA from older CABG.  Suspect that the troponin elevation was due to demand ischemia  in the setting of hypertensive emergency and tachycardia (possible run of atrial tachycardia).  Admission in 1/15 with atypical chest pain, had Lexiscan Cardiolite that showed no ischemia.  Cardiolite in 11/21 with no ischemia/infarction. She has chronic, stable episodes of atypical chest pain. Given normal Cardiolite and atypical nature of pain, possible microvascular angina.  - No ASA given Eliquis use.  - Continue Crestor 40 mg daily.   - Can continue low dose Toprol XL 12.5 mg daily (she will be getting PPM soon). .       2. Atrial fibrillation: She is on amiodarone 200 daily and apixaban (dose reduction of apixaban given renal function and weight).  - Check LFTs, TSH.  She will need a regular eye exam with amiodarone.  LFTs recently have been mildly elevated.  If they are not back to normal, I will have her decrease amiodarone to 100 mg daily and she will need a RUQ [place] to assess the liver.  3. HYPERLIPIDEMIA: Will need to check lipids with Crestor and Zetia at next appt.  4. Bioprosthetic aortic valve: Stable on 11/21 echo.   5. Chronic diastolic CHF: Echo in 48/62 with EF 55-60% with mild LVH.  She has prominent neuropathy with tingling/burning in feet, also may have autonomic neuropathy as cause of her orthostatic symptoms. She looks euvolemic today, NYHA class III.  - I think that it would be worthwhile working her up for cardiac amyloidosis given CAD, renal failure, and neuropathy. Urine and serum immunofixation were negative.  She will get a PYP scan to assess for TTR amyloidosis (has been scheduled).  - Continue to take Lasix prn.  6. CKD: Stage III.  - BMET today.  7. 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  - As above, amyloidosis workup.  - Continue midodrine 5 mg tid.    Followup in 3 months.   Loralie Champagne 08/02/2020

## 2020-08-02 NOTE — Patient Instructions (Addendum)
EKG done today.  Labs done today. We will contact you only if your labs are abnormal.  No medication changes were made. Please continue all current medications as prescribed.  Your physician recommends that you schedule a follow-up appointment in: 3 months  If you have any questions or concerns before your next appointment please send [place] a message through [place] or call our office at [phone].    TO LEAVE A MESSAGE FOR THE NURSE SELECT OPTION 2, PLEASE LEAVE A MESSAGE INCLUDING: . YOUR NAME . DATE OF BIRTH . CALL BACK NUMBER . REASON FOR CALL**this is important as we prioritize the call backs  YOU WILL RECEIVE A CALL BACK THE SAME DAY AS LONG AS YOU CALL BEFORE 4:00 PM   Do the following things EVERYDAY: 1) Weigh yourself in the morning before breakfast. Write it down and keep it in a log. 2) Take your medicines as prescribed 3) Eat low salt foods--Limit salt (sodium) to 2000 mg per day.  4) Stay as active as you can everyday 5) Limit all fluids for the day to less than 2 liters   At the [facility], you and your health needs are our priority. As part of our continuing mission to provide you with exceptional heart care, we have created designated Provider Care Teams. These Care Teams include your primary Cardiologist (physician) and Advanced Practice Providers (APPs- Physician Assistants and Nurse Practitioners) who all work together to provide you with the care you need, when you need it.   You may see any of the following providers on your designated Care Team at your next follow up: Marland Kitchen Dr Glori Bickers . Dr Loralie Champagne . Darrick Grinder, NP . Lyda Jester, PA . Audry Riles, PharmD   Please be sure to bring in all your medications bottles to every appointment.

## 2020-08-03 ENCOUNTER — Encounter (HOSPITAL_COMMUNITY): Payer: Medicare Other

## 2020-08-04 LAB — CUP PACEART REMOTE DEVICE CHECK
Date Time Interrogation Session: 20220217235923
Implantable Pulse Generator Implant Date: 20190322

## 2020-08-08 ENCOUNTER — Other Ambulatory Visit (HOSPITAL_COMMUNITY): Payer: Medicare Other

## 2020-08-08 ENCOUNTER — Encounter (HOSPITAL_COMMUNITY): Payer: Medicare Other

## 2020-08-09 ENCOUNTER — Telehealth (HOSPITAL_COMMUNITY): Payer: Self-pay | Admitting: *Deleted

## 2020-08-09 NOTE — Telephone Encounter (Signed)
Submitted appeal for PYP

## 2020-08-09 NOTE — Progress Notes (Signed)
Instructed patient on the following items: Arrival time 1200 Nothing to eat or drink after midnight No meds AM of procedure Responsible person to drive you home and stay with you for 24 hrs Wash with special soap night before and morning of procedure If on anti-coagulant drug instructions Eliquis- last dose Sunday evening

## 2020-08-10 ENCOUNTER — Ambulatory Visit (HOSPITAL_COMMUNITY): Payer: Medicare Other

## 2020-08-10 ENCOUNTER — Encounter (HOSPITAL_COMMUNITY)
Admission: RE | Disposition: A | Discharge status: Home / Self Care | Payer: Medicare Other | Source: Home / Self Care | Attending: Internal Medicine

## 2020-08-10 ENCOUNTER — Ambulatory Visit (HOSPITAL_COMMUNITY)
Admission: RE | Admit: 2020-08-10 | Discharge: 2020-08-10 | Disposition: A | Discharge status: Home / Self Care | Payer: Medicare Other | Attending: Internal Medicine | Admitting: Internal Medicine

## 2020-08-10 ENCOUNTER — Ambulatory Visit (HOSPITAL_COMMUNITY): Admit: 2020-08-10 | Payer: Medicare Other | Admitting: Internal Medicine

## 2020-08-10 DIAGNOSIS — Z881 Allergy status to other antibiotic agents status: Secondary | ICD-10-CM | POA: Insufficient documentation

## 2020-08-10 DIAGNOSIS — R55 Syncope and collapse: Secondary | ICD-10-CM | POA: Diagnosis not present

## 2020-08-10 DIAGNOSIS — Z79899 Other long term (current) drug therapy: Secondary | ICD-10-CM | POA: Diagnosis not present

## 2020-08-10 DIAGNOSIS — Z794 Long term (current) use of insulin: Secondary | ICD-10-CM | POA: Insufficient documentation

## 2020-08-10 DIAGNOSIS — Z880 Allergy status to penicillin: Secondary | ICD-10-CM | POA: Diagnosis not present

## 2020-08-10 DIAGNOSIS — Z7951 Long term (current) use of inhaled steroids: Secondary | ICD-10-CM | POA: Insufficient documentation

## 2020-08-10 DIAGNOSIS — Z7901 Long term (current) use of anticoagulants: Secondary | ICD-10-CM | POA: Insufficient documentation

## 2020-08-10 DIAGNOSIS — I495 Sick sinus syndrome: Secondary | ICD-10-CM | POA: Diagnosis not present

## 2020-08-10 DIAGNOSIS — I10 Essential (primary) hypertension: Secondary | ICD-10-CM | POA: Insufficient documentation

## 2020-08-10 DIAGNOSIS — Z91040 Latex allergy status: Secondary | ICD-10-CM | POA: Insufficient documentation

## 2020-08-10 DIAGNOSIS — Z8249 Family history of ischemic heart disease and other diseases of the circulatory system: Secondary | ICD-10-CM | POA: Diagnosis not present

## 2020-08-10 DIAGNOSIS — I2510 Atherosclerotic heart disease of native coronary artery without angina pectoris: Secondary | ICD-10-CM | POA: Diagnosis not present

## 2020-08-10 DIAGNOSIS — I480 Paroxysmal atrial fibrillation: Secondary | ICD-10-CM | POA: Insufficient documentation

## 2020-08-10 DIAGNOSIS — Z888 Allergy status to other drugs, medicaments and biological substances status: Secondary | ICD-10-CM | POA: Insufficient documentation

## 2020-08-10 DIAGNOSIS — Z952 Presence of prosthetic heart valve: Secondary | ICD-10-CM | POA: Diagnosis not present

## 2020-08-10 DIAGNOSIS — Z951 Presence of aortocoronary bypass graft: Secondary | ICD-10-CM | POA: Diagnosis not present

## 2020-08-10 DIAGNOSIS — Z950 Presence of cardiac pacemaker: Secondary | ICD-10-CM

## 2020-08-10 HISTORY — PX: PACEMAKER IMPLANT: EP1218

## 2020-08-10 HISTORY — PX: LOOP RECORDER REMOVAL: EP1215

## 2020-08-10 LAB — GLUCOSE, CAPILLARY: Glucose-Capillary: 176 mg/dL — ABNORMAL HIGH (ref 70–99)

## 2020-08-10 SURGERY — PACEMAKER IMPLANT

## 2020-08-10 MED ORDER — LIDOCAINE HCL 1 % IJ SOLN
INTRAMUSCULAR | Status: AC
  Filled 2020-08-10: qty 60

## 2020-08-10 MED ORDER — SODIUM CHLORIDE 0.9 % IV SOLN
INTRAVENOUS | Status: AC
  Filled 2020-08-10: qty 2

## 2020-08-10 MED ORDER — LIDOCAINE HCL (PF) 1 % IJ SOLN
INTRAMUSCULAR | Status: DC | PRN
  Administered 2020-08-10: 20 mL
  Administered 2020-08-10: 60 mL

## 2020-08-10 MED ORDER — HEPARIN (PORCINE) IN NACL 1000-0.9 UT/500ML-% IV SOLN
INTRAVENOUS | Status: DC | PRN
  Administered 2020-08-10 (×2): 500 mL

## 2020-08-10 MED ORDER — CHLORHEXIDINE GLUCONATE 4 % EX LIQD: 4.0000 "application " | Freq: Once | CUTANEOUS | Status: DC

## 2020-08-10 MED ORDER — ACETAMINOPHEN 325 MG PO TABS
325.0000 mg | ORAL_TABLET | ORAL | Status: DC | PRN
  Filled 2020-08-10: qty 2

## 2020-08-10 MED ORDER — HEPARIN (PORCINE) IN NACL 1000-0.9 UT/500ML-% IV SOLN
INTRAVENOUS | Status: AC
  Filled 2020-08-10: qty 500

## 2020-08-10 MED ORDER — ONDANSETRON HCL 4 MG/2ML IJ SOLN: 4.0000 mg | Freq: Four times a day (QID) | INTRAMUSCULAR | Status: DC | PRN

## 2020-08-10 MED ORDER — SODIUM CHLORIDE 0.9 % IV SOLN: INTRAVENOUS | Status: AC

## 2020-08-10 MED ORDER — MIDAZOLAM HCL 5 MG/5ML IJ SOLN
INTRAMUSCULAR | Status: DC | PRN
  Administered 2020-08-10 (×3): 1 mg via INTRAVENOUS

## 2020-08-10 MED ORDER — SODIUM CHLORIDE 0.9 % IV SOLN
INTRAVENOUS | Status: AC | PRN
Start: 2020-08-10 — End: 2020-08-10
  Administered 2020-08-10: 250 mL via INTRAVENOUS

## 2020-08-10 MED ORDER — SODIUM CHLORIDE 0.9 % IV SOLN: INTRAVENOUS | Status: DC

## 2020-08-10 MED ORDER — SODIUM CHLORIDE 0.9 % IV SOLN
80.0000 mg | INTRAVENOUS | Status: AC
  Administered 2020-08-10: 80 mg

## 2020-08-10 MED ORDER — MIDAZOLAM HCL 5 MG/5ML IJ SOLN
INTRAMUSCULAR | Status: AC
  Filled 2020-08-10: qty 5

## 2020-08-10 MED ORDER — LIDOCAINE HCL 1 % IJ SOLN
INTRAMUSCULAR | Status: AC
  Filled 2020-08-10: qty 20

## 2020-08-10 MED ORDER — FENTANYL CITRATE (PF) 100 MCG/2ML IJ SOLN
INTRAMUSCULAR | Status: AC
  Filled 2020-08-10: qty 2

## 2020-08-10 MED ORDER — POVIDONE-IODINE 10 % EX SWAB: 2.0000 "application " | Freq: Once | CUTANEOUS | Status: DC

## 2020-08-10 MED ORDER — FENTANYL CITRATE (PF) 100 MCG/2ML IJ SOLN
INTRAMUSCULAR | Status: DC | PRN
  Administered 2020-08-10 (×3): 12.5 ug via INTRAVENOUS

## 2020-08-10 MED ORDER — VANCOMYCIN HCL IN DEXTROSE 1-5 GM/200ML-% IV SOLN
1000.0000 mg | INTRAVENOUS | Status: AC
  Administered 2020-08-10: 1000 mg via INTRAVENOUS

## 2020-08-10 MED ORDER — VANCOMYCIN HCL IN DEXTROSE 1-5 GM/200ML-% IV SOLN
INTRAVENOUS | Status: AC
  Filled 2020-08-10: qty 200

## 2020-08-10 SURGICAL SUPPLY — 8 items
CABLE SURGICAL S-101-97-12 (CABLE) ×2 IMPLANT
LEAD SOLIA S PRO MRI 45 (Lead) ×2 IMPLANT
LEAD SOLIA S PRO MRI 53 (Lead) ×2 IMPLANT
PACEMAKER EDORA 8DR-T MRI (Pacemaker) ×2 IMPLANT
PACK LOOP INSERTION (CUSTOM PROCEDURE TRAY) ×2 IMPLANT
PAD PRO RADIOLUCENT 2001M-C (PAD) ×2 IMPLANT
SHEATH 7FR PRELUDE SNAP 13 (SHEATH) ×2 IMPLANT
TRAY PACEMAKER INSERTION (PACKS) ×2 IMPLANT

## 2020-08-10 NOTE — Discharge Instructions (Signed)
Pacemaker Implantation, Adult, Care After This sheet gives you information about how to care for yourself after your procedure. Your health care provider may also give you more specific instructions. If you have problems or questions, contact your health care provider. What can I expect after the procedure? After the procedure, it is common to have:  Mild pain.  Slight bruising.  Some swelling over the incisions.  A slight bump over the skin where the device was placed (if it was implanted in the upper chest area). Sometimes, it is possible to feel the device under the skin. This is normal. Follow these instructions at home: Medicines  Take over-the-counter and prescription medicines only as told by your health care provider.  If you were prescribed an antibiotic medicine, take it as told by your health care provider. Do not stop taking the antibiotic even if you start to feel better.  Ask your health care provider if the medicine prescribed to you requires you to avoid driving or using machinery. Incision site care  Do not remove the bandage (dressing) on your chest until you are told to do so by your health care provider.  After your dressing is removed, you may see pieces of tape called skin adhesive strips over the incision site. Let the strips fall off on their own.  Check your incision area every day for signs of infection. Check for: ? More redness, swelling, or pain. ? Fluid or blood. ? Warmth. ? Pus or a bad smell.  Do not use lotions or ointments near the incision site unless you are told to do so.  Keep the incision area clean and dry for 2-3 days after the procedure or as told by your health care provider. It takes several weeks for the incision site to completely heal.  Women may want to place a small pad over the incision site to protect it from their bra strap.  Do not take baths, swim, or use a hot tub for 7-10 days or until your health care provider approves.  Ask your health care provider if you may take showers. You may only be allowed to take sponge baths.   Activity  If you were given a medicine to help you relax (sedative) during the procedure, it can affect you for several hours. Do not drive or operate machinery until your health care provider says that it is safe.  Return to your normal activities as told by your health care provider. Ask your health care provider what activities are safe for you.  Do not lift anything that is heavier than 10 lb (4.5 kg), or the limit that you are told, until your health care provider says that it is safe.  Avoid sudden jerking, pulling, or chopping movements that pull your upper arm far away from your body. Avoid these movements for at least 6 weeks or as long as told by your health care provider.  Do not lift your upper arm above your shoulders for at least 6 weeks or as long as told by your health care provider. This includes activities like tennis, golf, or swimming.  You may go back to work when your health care provider says it is okay.   Electricity and magnetic fields  Avoid places or objects that have a strong electric or magnetic field, including: ? Airport Data processing manager. When at the airport, let officials know that you have a pacemaker. Carry your pacemaker ID card. ? Metal detectors. If you must pass through a metal  detector, walk through it quickly. Do not stop under the detector or stand near it. ? Power plants. ? Large electrical generators. ? Radiofrequency transmission towers, such as mobile phone and radio towers.  Do not use amateur Chief of Staff. If you are unsure of whether something is safe to use, ask your health care provider. Some devices may be safe to use if you hold them at least 1 ft (0.3 m) from your pacemaker. These devices may include power tools, lawn mowers, and speakers.  When using your mobile phone, hold it to the ear opposite the  pacemaker. Do not leave your mobile phone in a pocket over the pacemaker. Long-term care  You may be shown how to transfer data from your pacemaker through the phone to your health care provider.  Always let all health care providers, including dentists, know about your pacemaker before you have any medical procedures or tests.  Wear a medical ID bracelet or necklace stating that you have a pacemaker.  Carry a pacemaker ID card with you at all times.  Your pacemaker battery will last for 5-15 years. Your health care provider will do routine checks to know when the battery is starting to run down. When this happens, the pacemaker will need to be replaced. General instructions  Do not use any products that contain nicotine or tobacco, such as cigarettes, e-cigarettes, and chewing tobacco. These can delay incision healing after surgery. If you need help quitting, ask your health care provider.  Follow instructions from your health care provider about eating or drinking restrictions.  Weigh yourself every day. If you suddenly gain weight, fluid may be building up in your body.  Keep all follow-up visits as told by your health care provider. This is important. During follow-up visits, your pacemaker will be checked and reprogrammed if necessary. Contact a health care provider if:  You gain weight suddenly.  Your legs or feet swell.  It feels like your heart is fluttering or skipping beats (you have heart palpitations).  You have any of these signs of infection: ? More redness, swelling, or pain around an incision. ? Fluid or blood coming from an incision. ? Warmth coming from an incision. ? Pus or a bad smell coming from an incision. ? A fever or chills. Get help right away if:  You have chest pain.  You have trouble breathing or are short of breath.  You become extremely tired.  You are light-headed or you faint. These symptoms may represent a serious problem that is an  emergency. Do not wait to see if the symptoms will go away. Get medical help right away. Call your local emergency services (911 in the [place] Do not drive yourself to the hospital. Summary  After the procedure, it is common to have mild pain, swelling, or bruising over the incision.  Take over-the-counter and prescription medicines only as told by your health care provider.  Do not raise your arm above your shoulder or lift anything that is heavier than 10 lb (4.5 kg), or the limit that you are told, until your health care provider says that it is safe.  Carry a pacemaker ID card with you at all times. This information is not intended to replace advice given to you by your health care provider. Make sure you discuss any questions you have with your health care provider. Document Revised: 04/29/2019 Document Reviewed: 04/29/2019 Elsevier Patient Education  2021 [employer].  Supplemental Discharge Instructions for  Pacemaker/Defibrillator Patients  Tomorrow, 08/11/20, send in a device transmission  Activity No heavy lifting or vigorous activity with your left/right arm for 6 to 8 weeks.  Do not raise your left/right arm above your head for one week.  Gradually raise your affected arm as drawn below.             08/18/20                     08/19/20                    08/20/20                   08/21/20 __  NO DRIVING for  1 week   ; you may begin driving on   9/51/88  .  WOUND CARE - Keep the wound area clean and dry.  Do not get this area wet , no showers for one week; you may shower on 08/18/20 . - Tomorrow, 08/11/20, remove the arm sling - Tomorrow, 08/11/20 remove the outer plastic bandage.  Underneath the plastic bandage there are steri strips (paper tapes), DO NOT remove these. - The tape/steri-strips on your wound will fall off; do not pull them off.  No bandage is needed on the site.  DO  NOT apply any creams, oils, or ointments to the wound area. - If you notice any  drainage or discharge from the wound, any swelling or bruising at the site, or you develop a fever > 101? F after you are discharged home, call the office at once.  Special Instructions - You are still able to use cellular telephones; use the ear opposite the side where you have your pacemaker/defibrillator.  Avoid carrying your cellular phone near your device. - When traveling through airports, show security personnel your identification card to avoid being screened in the metal detectors.  Ask the security personnel to use the hand wand. - Avoid arc welding equipment, MRI testing (magnetic resonance imaging), TENS units (transcutaneous nerve stimulators).  Call the office for questions about other devices. - Avoid electrical appliances that are in poor condition or are not properly grounded. - Microwave ovens are safe to be near or to operate.

## 2020-08-10 NOTE — Progress Notes (Signed)
Pt's automatic  Diastolic BP 06J and 49Q.  Verified with manuel cuff .  Reported to Octavia Heir who is here to assess pt

## 2020-08-10 NOTE — Interval H&P Note (Signed)
History and Physical Interval Note:  08/10/2020 12:57 PM  Hannah [name]  has presented today for surgery, with the diagnosis of bradicardia.  The various methods of treatment have been discussed with the patient and family. After consideration of risks, benefits and other options for treatment, the patient has consented to  Procedure(s): PACEMAKER IMPLANT (N/A) LOOP RECORDER REMOVAL (N/A) as a surgical intervention.  The patient's history has been reviewed, patient examined, no change in status, stable for surgery.  I have reviewed the patient's chart and labs.  Questions were answered to the patient's satisfaction.     [name]

## 2020-08-10 NOTE — Progress Notes (Signed)
Dr Lovena Le notified of CXR results and b/p and ok to d/c home

## 2020-08-10 NOTE — Progress Notes (Signed)
Report given to Irving Burton who will assume care at this time. Awaiting XR results. NS bolus infusing

## 2020-08-10 NOTE — Progress Notes (Signed)
Patient feels well Manual BP R arm is 98/45, L arm is 100/50 Skin is warm, dry, no dizziness, or symptoms She takes midodrine though is laying down. Will give 236ml NS bolus Dr. Lovena Le aware RN will follow up with MD for further  Pending CXR  Tommye Standard, PA-C

## 2020-08-11 ENCOUNTER — Encounter (HOSPITAL_COMMUNITY): Payer: Self-pay | Admitting: Internal Medicine

## 2020-08-11 MED FILL — Lidocaine HCl Local Inj 1%: INTRAMUSCULAR | Qty: 80 | Status: AC

## 2020-08-12 ENCOUNTER — Telehealth: Payer: Self-pay

## 2020-08-12 NOTE — Telephone Encounter (Signed)
Follow-up after same day discharge: Implant date: 08/10/20 MD: [name], MD Device: [facility] DR-T Location: Left chest   Wound check visit: 08/23/20  @ 11:40 90 day MD follow-up: 11/15/20 @ 2:30  Remote Transmission received:Yes  Dressing removed: Unkown?  Patient did not answer phone. LMOVM.

## 2020-08-12 NOTE — Telephone Encounter (Signed)
-----   Message from [place], [place] sent at 08/12/2020  2:17 PM EST ----- Biotronik PPM  Same day d/c from Wed  GT

## 2020-08-15 ENCOUNTER — Telehealth (HOSPITAL_COMMUNITY): Payer: Self-pay | Admitting: Vascular Surgery

## 2020-08-15 NOTE — Telephone Encounter (Signed)
Left pt detailed  message giving PYP scan appt 3/16 @ 11 am , asked pt to call back to confirm appt

## 2020-08-16 NOTE — Telephone Encounter (Signed)
2nd attempt to contact patient. No answer, LMOVM.

## 2020-08-18 NOTE — Progress Notes (Signed)
Patient Care Team: [name], DO as PCP - General [name], NP as PCP - Cardiology (Nurse Practitioner) Evans Lance, MD as PCP - Electrophysiology (Cardiology) Alphonsa Overall, MD as Consulting Physician (General Surgery) Nicholas Lose, MD as Consulting Physician (Hematology and Oncology) Gery Pray, MD as Consulting Physician (Radiation Oncology) Evans Lance, MD as Consulting Physician (Cardiology) Collene Gobble, MD as Consulting Physician (Pulmonary Disease) Elayne Snare, MD as Consulting Physician (Endocrinology) Delice Bison, Charlestine Massed, NP as Nurse Practitioner (Hematology and Oncology) Ardelle Balls., MD (Neurology) Izora Gala, MD as Consulting Physician (Otolaryngology) Trula Slade, DPM as Consulting Physician (Podiatry) Cameron Sprang, MD as Consulting Physician (Neurology)  DIAGNOSIS:    ICD-10-CM   1. Anemia of chronic disease  D63.8     SUMMARY OF ONCOLOGIC HISTORY: Oncology History  Breast cancer of upper-inner quadrant of right female breast ([place])  12/14/2015 Initial Diagnosis   Right breast biopsy 12:30 position: 2 masses, 1.6 cm mass: Invasive ductal carcinoma, grade 2, ER 0%, PR 0%, HER-2 negative, Ki-67 70%; satellite mass 8 mm: IDC grade 2 ER 5%, PR 5%, HER-2 negative, Ki-67 50%; T1cN0 stage IA clinical stage   01/18/2016 Surgery   Right lumpectomy: Multifocal IDC grade 3, 1.9 cm  ER 0%, PR 0%, HER-2 negative, Ki-67 70% and 0.8 cm satellite mass ER 5%, PR 2%, HER-2 negative, Ki-67 50%, high-grade DCIS, margins negative, 0/1 lymph nodes negative T1 cN0 stage IA   02/17/2016 - 04/06/2016 Chemotherapy   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 with above-mentioned history of breast cancer and severe anemia.She presents to the clinic todayfor follow-up. She had a pace maker implanted and shes doing better.able to walk today to clinic.  ALLERGIES:  is allergic to amoxicillin, tape, aldactone [spironolactone], arimidex [anastrozole], latex, and tetracycline.  MEDICATIONS:  Current Outpatient Medications  Medication Sig Dispense Refill  . Accu-Chek FastClix Lancets MISC Use to check blood sugar 3 times per day 100 each 5  . ACCU-CHEK GUIDE test strip Use to test blood sugar 3 times daily 150 strip 3  . acetaminophen (TYLENOL) 500 MG tablet Take 1,000 mg by mouth as needed for moderate pain.    Marland Kitchen albuterol (PROVENTIL) (2.5 MG/3ML) 0.083% nebulizer solution Take 3 mLs (2.5 mg total) by nebulization every 6 (six) hours as needed for wheezing or shortness of breath. 75 mL 3  . albuterol (VENTOLIN HFA) 108 (90 Base) MCG/ACT inhaler Inhale 2 puffs into the lungs every 6 (six) hours as needed for wheezing or shortness of breath. 18 g 5  . amiodarone (PACERONE) 200 MG tablet TAKE ONE TABLET BY MOUTH DAILY (Patient taking differently: Take 200 mg by mouth daily.) 90 tablet 3  . Blood Glucose Monitoring Suppl (ACCU-CHEK GUIDE ME) w/Device KIT Use accu chek meter to check blood sugar three times daily. 1 kit 0  . cetirizine (ZYRTEC) 10 MG tablet Take  10 mg by mouth daily.    . Cholecalciferol (DIALYVITE VITAMIN D 5000) 125 MCG (5000 UT) capsule Take 5,000 Units by mouth daily.    Marland Kitchen ELIQUIS 2.5 MG TABS tablet Take 1 tablet (2.5 mg total) by mouth 2 (two) times daily. (Patient taking differently: Take 2.5 mg by mouth 2 (two) times daily.) 180 tablet 1  .  exemestane (AROMASIN) 25 MG tablet TAKE ONE TABLET BY MOUTH DAILY AFTER BREAKFAST (Patient taking differently: Take 25 mg by mouth daily after breakfast.) 90 tablet 3  . ezetimibe (ZETIA) 10 MG tablet TAKE ONE TABLET BY MOUTH EVERY DAY (Patient taking differently: Take 10 mg by mouth daily.) 90 tablet 3  . famotidine (PEPCID) 20 MG tablet Take 20 mg by mouth daily as needed for heartburn or indigestion.    . furosemide (LASIX) 20 MG tablet Take 20 mg by mouth daily as needed for fluid.    Marland Kitchen HUMALOG KWIKPEN 100 UNIT/ML KwikPen INJECT SUBCUTANEOUSLY 4 TO  6 UNITS 3 TIMES DAILY  BEFORE MEALS (Patient taking differently: Inject 6-8 Units into the skin 3 (three) times daily.) 30 mL 3  . insulin glargine (LANTUS) 100 UNIT/ML injection Inject 20 Units into the skin daily.     . Insulin Pen Needle (BD PEN NEEDLE NANO U/F) 32G X 4 MM MISC USE AS INSTRUCTED TO INJECT INSULIN 4 TIMES DAILY. 360 each 1  . metoprolol succinate (TOPROL XL) 25 MG 24 hr tablet Take 0.5 tablets (12.5 mg total) by mouth daily. 45 tablet 3  . midodrine (PROAMATINE) 5 MG tablet Take 1 tablet (5 mg total) by mouth 3 (three) times daily with meals. 270 tablet 3  . nitroGLYCERIN (NITROSTAT) 0.4 MG SL tablet Place 1 tablet (0.4 mg total) under the tongue every 5 (five) minutes as needed for chest pain. 25 tablet 3  . ondansetron (ZOFRAN ODT) 4 MG disintegrating tablet Take 1 tablet (4 mg total) by mouth every 8 (eight) hours as needed. (Patient taking differently: Take 4 mg by mouth every 8 (eight) hours as needed for nausea or vomiting.) 10 tablet 0  . rosuvastatin (CRESTOR) 40 MG tablet Take 1 tablet (40 mg total) by mouth at bedtime. 90 tablet 3  . SYMBICORT 160-4.5 MCG/ACT inhaler INHALE TWO PUFFS INTO THE LUNGS TWICE DAILY (Patient taking differently: Inhale 2 puffs into the lungs in the morning and at bedtime.) 10.2 g 1  . TRULICITY 1.5 GY/5.6LS SOPN INJECT THE CONTENTS OF ONE  PEN SUBCUTANEOUSLY WEEKLY  AS DIRECTED (Patient taking  differently: Inject 1.5 mg into the skin every 7 (seven) days.) 6 mL 3  . vitamin B-12 (CYANOCOBALAMIN) 100 MCG tablet Take 100 mcg by mouth daily.    Marland Kitchen zolpidem (AMBIEN) 5 MG tablet Take 7.5 mg by mouth at bedtime as needed for sleep.     No current facility-administered medications for this visit.    PHYSICAL EXAMINATION: ECOG PERFORMANCE STATUS: 2 - Symptomatic, <50% confined to bed  Vitals:   08/19/20 1146  BP: (!) 134/47  Pulse: 72  Resp: 18  Temp: (!) 97.5 F (36.4 C)  SpO2: 100%   Filed Weights   08/19/20 1146  Weight: 135 lb 1.6 oz (61.3 kg)    LABORATORY DATA:  I have reviewed the data as listed CMP Latest Ref Rng & Units 08/19/2020 08/02/2020 07/22/2020  Glucose 70 - 99 mg/dL 381(H) 197(H) 237(H)  BUN 8 - 23 mg/dL 36(H) 33(H) 49(H)  Creatinine 0.44 - 1.00 mg/dL 2.57(H) 2.89(H) 2.83(H)  Sodium 135 -  145 mmol/L 137 140 137  Potassium 3.5 - 5.1 mmol/L 4.6 5.0 4.8  Chloride 98 - 111 mmol/L 106 106 110  CO2 22 - 32 mmol/L _0 Calcium 8.9 - 10.3 mg/dL 8.7(L) 9.2 8.7(L)  Total Protein 6.5 - 8.1 g/dL 6.0(L) 6.1(L) 6.2(L)  Total Bilirubin 0.3 - 1.2 mg/dL 0.5 0.6 0.6  Alkaline Phos 38 - 126 U/L 70 93 78  AST 15 - 41 U/L 30 32 84(H)  ALT 0 - 44 U/L 24 35 117(H)    Lab Results  Component Value Date   WBC 4.4 08/19/2020   HGB 9.5 (L) 08/19/2020   HCT 31.8 (L) 08/19/2020   MCV 98.5 08/19/2020   PLT 130 (L) 08/19/2020   NEUTROABS 3.1 08/19/2020    ASSESSMENT & PLAN:  Anemia of chronic disease Patient has had longstanding anemia where the hemoglobin was between 10 to 12 g. Since April 2020 her hemoglobin has gotten worse During the same timeframe her creatinine got from 1.5-2.44   Current treatment:Retacritinjectionsweeklystarted7/31/2020; changed to Aranesp 533m q3 weeks 11/12/2019 Bone marrow biopsy 04/08/2019: Normocellular bone marrow with trilineage hematopoiesis, absent iron stores, flow cytometry no abnormal B or T-cell clonal abnormalities noted.    IV iron therapy:Venofer: November 2020, July 2021 Retacrit: Last injection9/17/2021 Lab review: 01/22/2020: Hemoglobin 10.3 (previously transfuse for hemoglobin of 7.5) 02/04/2020:Hemoglobin 11.4(did not receive Aranesp injection) 03/14/2020: Hemoglobin 10.5, platelets 108 (did not receive Aranesp injection) 03/25/20:Hemoglobin 10.6 (did not receive Aranesp) 05/13/2020: Hemoglobin 8.9: Receiving Aranesp today 06/17/2020: Hemoglobin 9.9: Receiving Aranesp 07/22/2020: Hemoglobin 10.4: Received Aranesp 08/19/20: Hb 9.5 : received Aranesp  She will receive Aranesp every 4 weeks with labs. I will see her in 8 weeks.    No orders of the defined types were placed in this encounter.  The patient has a good understanding of the overall plan. she agrees with it. she will call with any problems that may develop before the next visit here.  Total time spent: 20 mins including face to face time and time spent for planning, charting and coordination of care  VRulon Eisenmenger MD, MPH 08/19/2020  I, Molly Dorshimer, am acting as scribe for Dr. VNicholas Lose  I have reviewed the above documentation for accuracy and completeness, and I agree with the above.

## 2020-08-19 ENCOUNTER — Inpatient Hospital Stay: Payer: Medicare Other

## 2020-08-19 ENCOUNTER — Inpatient Hospital Stay: Payer: Medicare Other | Attending: Adult Health | Admitting: Hematology and Oncology

## 2020-08-19 ENCOUNTER — Other Ambulatory Visit: Payer: Self-pay

## 2020-08-19 DIAGNOSIS — Z923 Personal history of irradiation: Secondary | ICD-10-CM | POA: Diagnosis not present

## 2020-08-19 DIAGNOSIS — Z170 Estrogen receptor positive status [ER+]: Secondary | ICD-10-CM

## 2020-08-19 DIAGNOSIS — D631 Anemia in chronic kidney disease: Secondary | ICD-10-CM | POA: Insufficient documentation

## 2020-08-19 DIAGNOSIS — Z79899 Other long term (current) drug therapy: Secondary | ICD-10-CM | POA: Diagnosis not present

## 2020-08-19 DIAGNOSIS — Z9221 Personal history of antineoplastic chemotherapy: Secondary | ICD-10-CM | POA: Insufficient documentation

## 2020-08-19 DIAGNOSIS — C50211 Malignant neoplasm of upper-inner quadrant of right female breast: Secondary | ICD-10-CM | POA: Insufficient documentation

## 2020-08-19 DIAGNOSIS — Z7901 Long term (current) use of anticoagulants: Secondary | ICD-10-CM | POA: Diagnosis not present

## 2020-08-19 DIAGNOSIS — D638 Anemia in other chronic diseases classified elsewhere: Secondary | ICD-10-CM | POA: Diagnosis not present

## 2020-08-19 DIAGNOSIS — I129 Hypertensive chronic kidney disease with stage 1 through stage 4 chronic kidney disease, or unspecified chronic kidney disease: Secondary | ICD-10-CM | POA: Diagnosis not present

## 2020-08-19 DIAGNOSIS — N189 Chronic kidney disease, unspecified: Secondary | ICD-10-CM | POA: Diagnosis not present

## 2020-08-19 LAB — CMP (CANCER CENTER ONLY)
ALT: 24 U/L (ref 0–44)
AST: 30 U/L (ref 15–41)
Albumin: 2.6 g/dL — ABNORMAL LOW (ref 3.5–5.0)
Alkaline Phosphatase: 70 U/L (ref 38–126)
Anion gap: 5 (ref 5–15)
BUN: 36 mg/dL — ABNORMAL HIGH (ref 8–23)
CO2: 26 mmol/L (ref 22–32)
Calcium: 8.7 mg/dL — ABNORMAL LOW (ref 8.9–10.3)
Chloride: 106 mmol/L (ref 98–111)
Creatinine: 2.57 mg/dL — ABNORMAL HIGH (ref 0.44–1.00)
GFR, Estimated: 19 mL/min — ABNORMAL LOW (ref >60)
Glucose, Bld: 381 mg/dL — ABNORMAL HIGH (ref 70–99)
Potassium: 4.6 mmol/L (ref 3.5–5.1)
Sodium: 137 mmol/L (ref 135–145)
Total Bilirubin: 0.5 mg/dL (ref 0.3–1.2)
Total Protein: 6 g/dL — ABNORMAL LOW (ref 6.5–8.1)

## 2020-08-19 LAB — RETIC PANEL
Immature Retic Fract: 12.5 % (ref 2.3–15.9)
RBC.: 3.28 MIL/uL — ABNORMAL LOW (ref 3.87–5.11)
Retic Count, Absolute: 43.6 10*3/uL (ref 19.0–186.0)
Retic Ct Pct: 1.3 % (ref 0.4–3.1)
Reticulocyte Hemoglobin: 33.8 pg (ref >27.9)

## 2020-08-19 LAB — CBC WITH DIFFERENTIAL (CANCER CENTER ONLY)
Abs Immature Granulocytes: 0.01 10*3/uL (ref 0.00–0.07)
Basophils Absolute: 0.1 10*3/uL (ref 0.0–0.1)
Basophils Relative: 1 %
Eosinophils Absolute: 0.3 10*3/uL (ref 0.0–0.5)
Eosinophils Relative: 7 %
HCT: 31.8 % — ABNORMAL LOW (ref 36.0–46.0)
Hemoglobin: 9.5 g/dL — ABNORMAL LOW (ref 12.0–15.0)
Immature Granulocytes: 0 %
Lymphocytes Relative: 12 %
Lymphs Abs: 0.5 10*3/uL — ABNORMAL LOW (ref 0.7–4.0)
MCH: 29.4 pg (ref 26.0–34.0)
MCHC: 29.9 g/dL — ABNORMAL LOW (ref 30.0–36.0)
MCV: 98.5 fL (ref 80.0–100.0)
Monocytes Absolute: 0.4 10*3/uL (ref 0.1–1.0)
Monocytes Relative: 8 %
Neutro Abs: 3.1 10*3/uL (ref 1.7–7.7)
Neutrophils Relative %: 72 %
Platelet Count: 130 10*3/uL — ABNORMAL LOW (ref 150–400)
RBC: 3.23 MIL/uL — ABNORMAL LOW (ref 3.87–5.11)
RDW: 14.9 % (ref 11.5–15.5)
WBC Count: 4.4 10*3/uL (ref 4.0–10.5)
nRBC: 0 % (ref 0.0–0.2)

## 2020-08-19 LAB — IRON AND TIBC
Iron: 80 ug/dL (ref 41–142)
Saturation Ratios: 29 % (ref 21–57)
TIBC: 281 ug/dL (ref 236–444)
UIBC: 201 ug/dL (ref 120–384)

## 2020-08-19 LAB — FERRITIN: Ferritin: 56 ng/mL (ref 11–307)

## 2020-08-19 IMAGING — DX CHEST - 2 VIEW
2 series · 2 of 2 positions shown · non-contrast
Comparison: Radiographs October 28, 2018.

CLINICAL DATA: Dyspnea.

EXAM:
CHEST - 2 VIEW

[dg chest 2 view (1 of 2)]
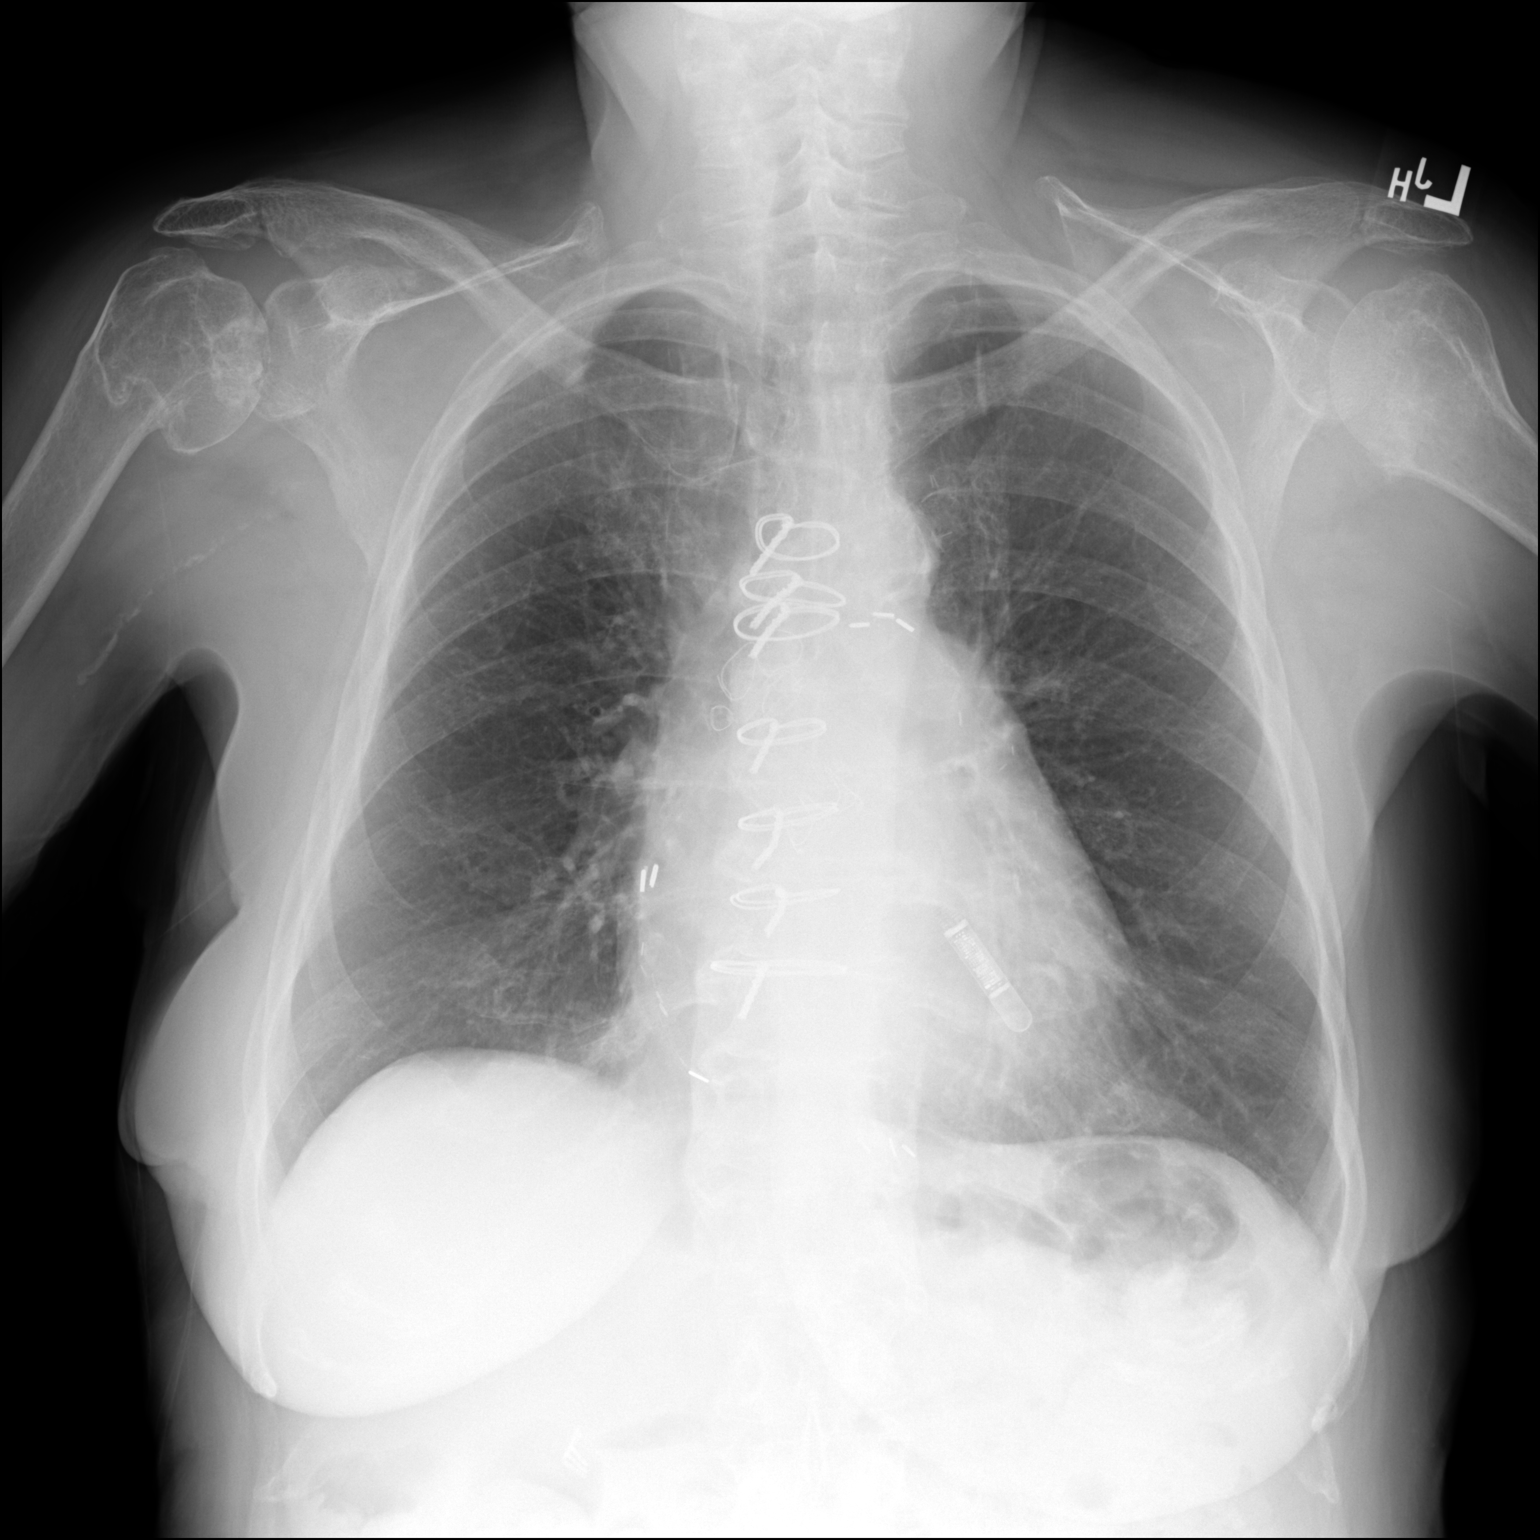

[dg chest 2 view (2 of 2)]
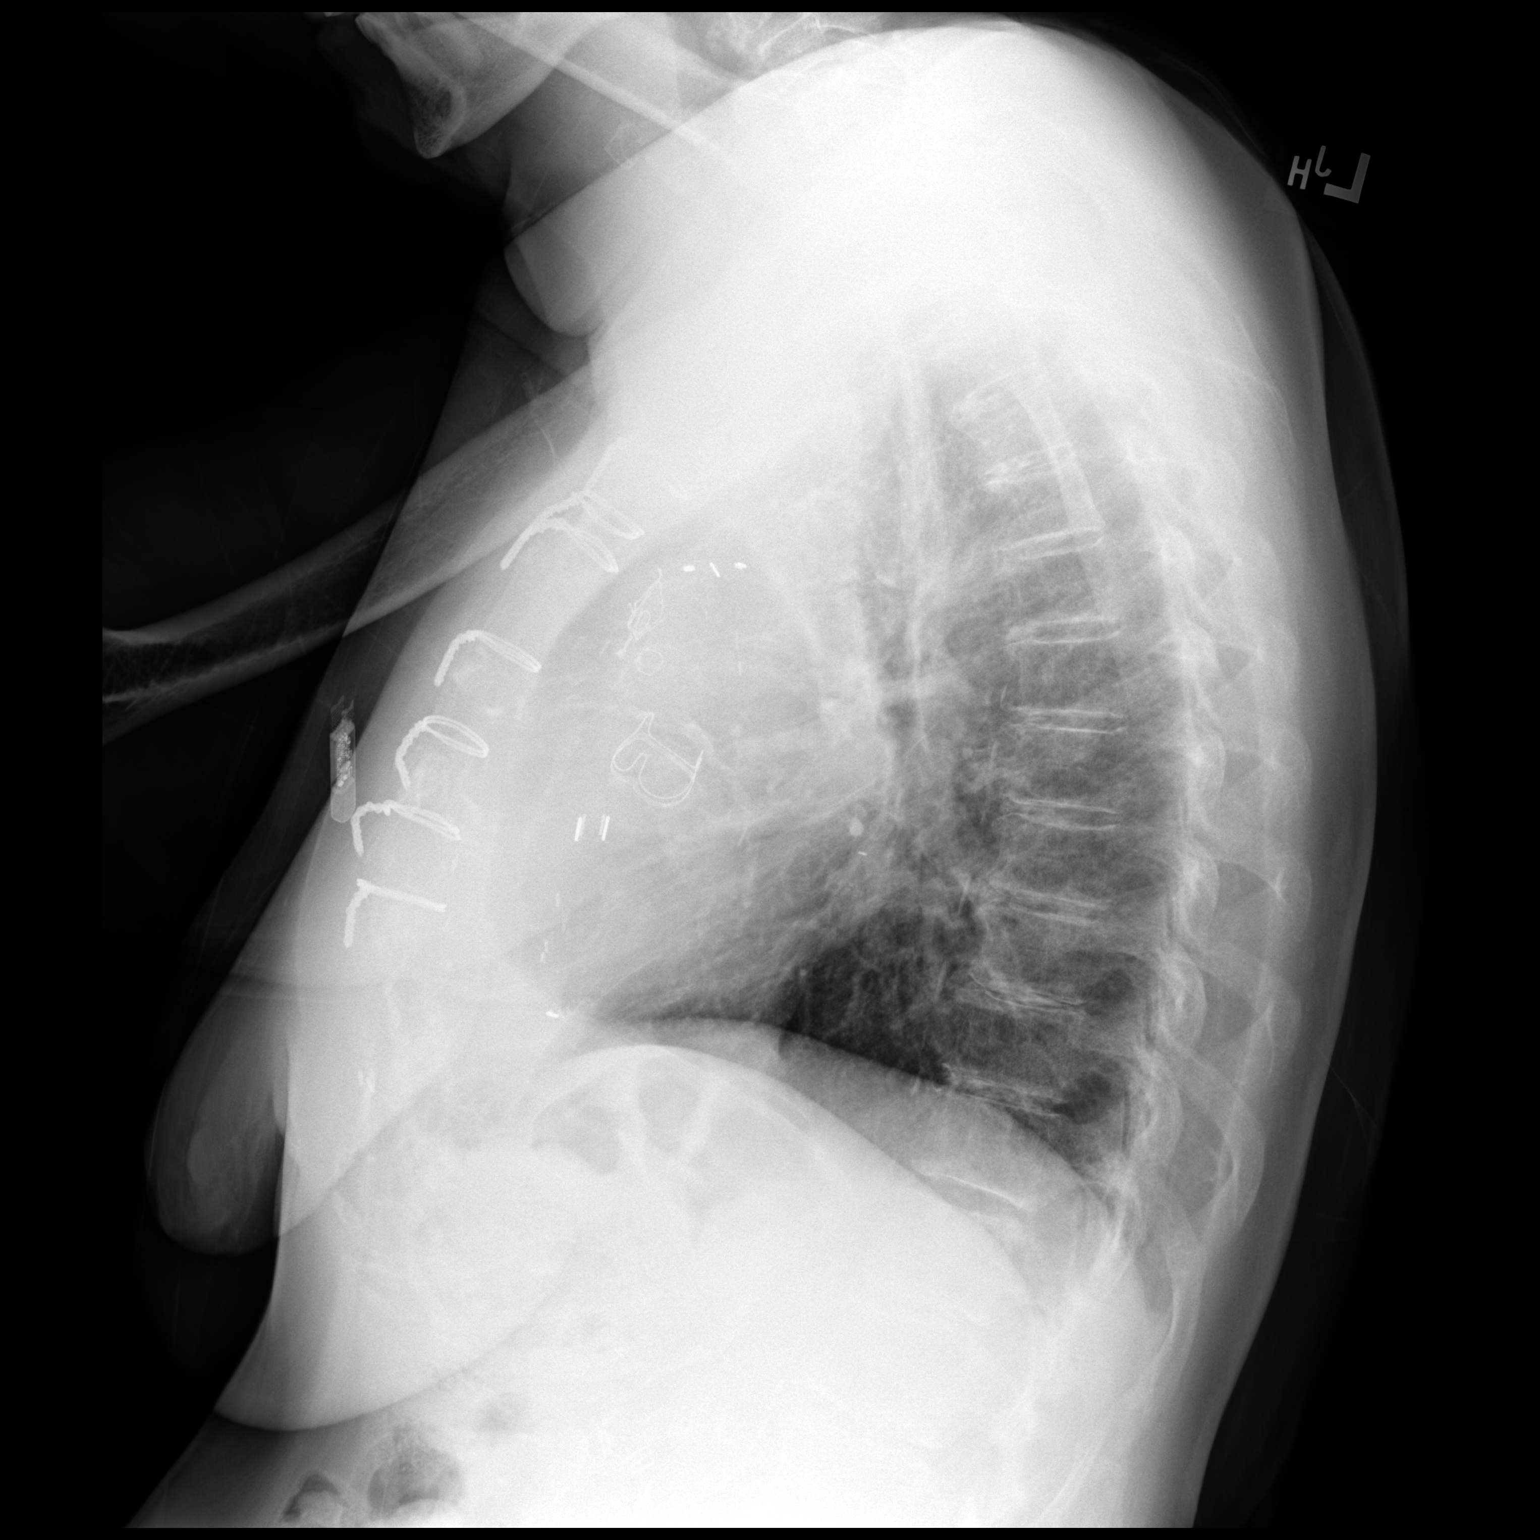

[2 of 2 positions shown; findings below may reference images not displayed]

FINDINGS: The heart size and mediastinal contours are within normal limits.
Both lungs are clear. No pneumothorax or pleural effusion is noted.
Atherosclerosis of thoracic aorta is noted. Status post coronary
bypass graft and cardiac valve repair. The visualized skeletal
structures are unremarkable.
IMPRESSION: No active cardiopulmonary disease.

Aortic Atherosclerosis ([id number]).

## 2020-08-19 MED ORDER — DARBEPOETIN ALFA 500 MCG/ML IJ SOSY
500.0000 ug | PREFILLED_SYRINGE | Freq: Once | INTRAMUSCULAR | Status: AC
  Administered 2020-08-19: 500 ug via SUBCUTANEOUS

## 2020-08-19 MED ORDER — DARBEPOETIN ALFA 500 MCG/ML IJ SOSY
PREFILLED_SYRINGE | INTRAMUSCULAR | Status: AC
  Filled 2020-08-19: qty 1

## 2020-08-19 NOTE — Telephone Encounter (Signed)
Successful telephone encounter to Hannah [name]. [name] and husband (on [place]) to follow up on recent PPM placement 08/10/20. Patient states she is doing well, following lifting and ROM restrictions, has removed outer dressing and sling, and is monitoring wound appearance daily. Denies any reddness, drainage or swelling at incision site. Steri-strips intact. Denies c/o pain or soreness. Confirmed patient received remote monitor and that transmission has been received. 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 Provided options such as utilizing speaker phone or ear buds. Patient agreeable. Provided patient with time and date of wound check and 91 day follow up. Patient provided clinic contact information and encouraged to call with questions or concerns.

## 2020-08-19 NOTE — Patient Instructions (Signed)
Darbepoetin Alfa injection What is this medicine? DARBEPOETIN ALFA (dar be POE e tin AL fa) helps your body make more red blood cells. It is used to treat anemia caused by chronic kidney failure and chemotherapy. This medicine may be used for other purposes; ask your health care provider or pharmacist if you have questions. COMMON BRAND NAME(S): Aranesp What should I tell my health care provider before I take this medicine? They need to know if you have any of these conditions:  blood clotting disorders or history of blood clots  cancer patient not on chemotherapy  cystic fibrosis  heart disease, such as angina, heart failure, or a history of a heart attack  hemoglobin level of 12 g/dL or greater  high blood pressure  low levels of folate, iron, or vitamin B12  seizures  an unusual or allergic reaction to darbepoetin, erythropoietin, albumin, hamster proteins, latex, other medicines, foods, dyes, or preservatives  pregnant or trying to get pregnant  breast-feeding How should I use this medicine? This medicine is for injection into a vein or under the skin. It is usually given by a health care professional in a hospital or clinic setting. If you get this medicine at home, you will be taught how to prepare and give this medicine. Use exactly as directed. Take your medicine at regular intervals. Do not take your medicine more often than directed. It is important that you put your used needles and syringes in a special sharps container. Do not put them in a trash can. If you do not have a sharps container, call your pharmacist or healthcare provider to get one. A special MedGuide will be given to you by the pharmacist with each prescription and refill. Be sure to read this information carefully each time. Talk to your pediatrician regarding the use of this medicine in children. While this medicine may be used in children as young as 1 month of age for selected conditions, precautions do  apply. Overdosage: If you think you have taken too much of this medicine contact a poison control center or emergency room at once. NOTE: This medicine is only for you. Do not share this medicine with others. What if I miss a dose? If you miss a dose, take it as soon as you can. If it is almost time for your next dose, take only that dose. Do not take double or extra doses. What may interact with this medicine? Do not take this medicine with any of the following medications:  epoetin alfa This list may not describe all possible interactions. Give your health care provider a list of all the medicines, herbs, non-prescription drugs, or dietary supplements you use. Also tell them if you smoke, drink alcohol, or use illegal drugs. Some items may interact with your medicine. What should I watch for while using this medicine? Your condition will be monitored carefully while you are receiving this medicine. You may need blood work done while you are taking this medicine. This medicine may cause a decrease in vitamin B6. You should make sure that you get enough vitamin B6 while you are taking this medicine. Discuss the foods you eat and the vitamins you take with your health care professional. What side effects may I notice from receiving this medicine? Side effects that you should report to your doctor or health care professional as soon as possible:  allergic reactions like skin rash, itching or hives, swelling of the face, lips, or tongue  breathing problems  changes in   vision  chest pain  confusion, trouble speaking or understanding  feeling faint or lightheaded, falls  high blood pressure  muscle aches or pains  pain, swelling, warmth in the leg  rapid weight gain  severe headaches  sudden numbness or weakness of the face, arm or leg  trouble walking, dizziness, loss of balance or coordination  seizures (convulsions)  swelling of the ankles, feet, hands  unusually weak or  tired Side effects that usually do not require medical attention (report to your doctor or health care professional if they continue or are bothersome):  diarrhea  fever, chills (flu-like symptoms)  headaches  nausea, vomiting  redness, stinging, or swelling at site where injected This list may not describe all possible side effects. Call your doctor for medical advice about side effects. You may report side effects to FDA at 1-800-FDA-1088. Where should I keep my medicine? Keep out of the reach of children. Store in a refrigerator between 2 and 8 degrees C (36 and 46 degrees F). Do not freeze. Do not shake. Throw away any unused portion if using a single-dose vial. Throw away any unused medicine after the expiration date. NOTE: This sheet is a summary. It may not cover all possible information. If you have questions about this medicine, talk to your doctor, pharmacist, or health care provider.  2021 Elsevier/Gold Standard (2017-06-12 16:44:20)  

## 2020-08-19 NOTE — Assessment & Plan Note (Signed)
Patient has had longstanding anemia where the hemoglobin was between 10 to 12 g. Since April 2020 her hemoglobin has gotten worse During the same timeframe her creatinine got from 1.5-2.44   Current treatment:Retacritinjectionsweeklystarted7/31/2020; changed to Aranesp 512m q3 weeks 11/12/2019 Bone marrow biopsy 04/08/2019: Normocellular bone marrow with trilineage hematopoiesis, absent iron stores, flow cytometry no abnormal B or T-cell clonal abnormalities noted.   IV iron therapy:Venofer: November 2020, July 2021 Retacrit: Last injection9/17/2021 Lab review: 01/22/2020: Hemoglobin 10.3 (previously transfuse for hemoglobin of 7.5) 02/04/2020:Hemoglobin 11.4(did not receive Aranesp injection) 03/14/2020: Hemoglobin 10.5, platelets 108 (did not receive Aranesp injection) 03/25/20:Hemoglobin 10.6 (did not receive Aranesp) 05/13/2020: Hemoglobin 8.9: Receiving Aranesp today 06/17/2020: Hemoglobin 9.9: Receiving Aranesp 07/22/2020: Hemoglobin 10.4: Received Aranesp  She will receive Aranesp every 4 weeks with labs. I will see her in 8 weeks.

## 2020-08-22 ENCOUNTER — Encounter (HOSPITAL_COMMUNITY): Payer: Self-pay

## 2020-08-23 ENCOUNTER — Ambulatory Visit (INDEPENDENT_AMBULATORY_CARE_PROVIDER_SITE_OTHER): Payer: Medicare Other | Admitting: Emergency Medicine

## 2020-08-23 ENCOUNTER — Other Ambulatory Visit: Payer: Self-pay

## 2020-08-23 ENCOUNTER — Encounter: Payer: Self-pay | Admitting: Endocrinology

## 2020-08-23 ENCOUNTER — Ambulatory Visit (INDEPENDENT_AMBULATORY_CARE_PROVIDER_SITE_OTHER): Payer: Medicare Other | Admitting: Endocrinology

## 2020-08-23 VITALS — BP 110/58 | HR 68 | Resp 20 | Ht 64.0 in | Wt 136.0 lb

## 2020-08-23 DIAGNOSIS — Z794 Long term (current) use of insulin: Secondary | ICD-10-CM

## 2020-08-23 DIAGNOSIS — E1165 Type 2 diabetes mellitus with hyperglycemia: Secondary | ICD-10-CM

## 2020-08-23 DIAGNOSIS — I428 Other cardiomyopathies: Secondary | ICD-10-CM

## 2020-08-23 NOTE — Progress Notes (Signed)
Wound check appointment. Steri-strips removed. Wound without redness or edema. Incision edges approximated, wound well healed. Normal device function. Thresholds, sensing, and impedances consistent with implant measurements. Device programmed at 3.0V/auto capture programmed on for extra safety margin until 3 month visit. Histogram distribution appropriate for patient and level of activity. No mode switches or high ventricular rates noted. Patient educated about wound care, arm mobility, lifting restrictions. ROV in 3 months with Dr. Lovena Le.

## 2020-08-23 NOTE — Progress Notes (Signed)
Patient ID: Hannah [name], female   DOB: 23-Apr-1946, 75 y.o.   MRN: [id number]           Reason for Appointment: Follow-up for Type 2 Diabetes   History of Present Illness:          Date of diagnosis of type 2 diabetes mellitus: Late 1980s        Background history:  She was previously treated with metformin prior to starting insulin She thinks she has been on insulin for at least 10 years and mostly taking Lantus and other types of insulin with it Her metformin was stopped when she started insulin and also has had renal dysfunction Previously her A1c had been consistently over 8% and occasionally higher.  She had been on the V-go pump since 03/2015, this was stopped in 3/20  Recent history:   INSULIN regimen is described as:  Lantus 20 units in am, Humalog 6-8 units before meals  Non-insulin hypoglycemic drugs the patient is taking are: Trulicity 1.5 mg weekly   Current diabetes management, blood sugar patterns and problems identified:   Her A1c is usually lower than expected, last 7% with PCP, has anemia   She did bring her monitor for download today  She appears to have significantly high blood sugars nonfasting and averaging over 200 all afternoon and evening  HIGHEST blood sugars are after supper  Also fasting blood sugars are higher than before and as high as 170  She did get some steroids in the last few weeks including prednisone and an intra-articular injection but none for 2 weeks  She has not gone up on her Humalog despite having significantly high readings fairly consistently  Weight has leveled out and her appetite is fairly normal  She is taking her Humalog right after eating since she is not sure how much she will eat  Unable to do any walking or exercise because of weakness  Usually eating 2 meals a day, mostly lunch and dinner although did eat breakfast this morning  Not clear why her sugar was not as high after dinner last night, likely had a  smaller meal  Dinner 6-7 PM usually  Side effects from medications have been: None  Compliance with the medical regimen: Fair  Glucose monitoring:  done about 2-3 times a day         Glucometer:  Accu-Chek [employer]  Blood Glucose readings by download:   PRE-MEAL Fasting Lunch Dinner Bedtime Overall  Glucose range:  101-170   111-265    Mean/median:  152   275  222   POST-MEAL PC Breakfast PC Lunch PC Dinner  Glucose range:    143-353  Mean/median:    255   Previously readings by recall:   PRE-MEAL Fasting Lunch Dinner Bedtime Overall  Glucose range: 97-123  200  140-180?   Mean/median:          Self-care: The diet that the patient has been following is: tries to limit fats and carbs.  She uses diet green tea or diet drinks and no sugar in drinks    Lunch 1-2 pm Dinner 6-7 PM  Typical meal intake: Breakfast is oatrmeal or cheese toast   if she has any            Dietician visit, most recent: 5/16 And in 7/16 with oncology dietitian    Weight history:  Wt Readings from Last 3 Encounters:  08/23/20 136 lb (61.7 kg)  08/19/20 135 lb 1.6 oz (61.3  kg)  08/10/20 132 lb (59.9 kg)    Glycemic control:      Lab Results  Component Value Date   HGBA1C 7.1 (H) 04/04/2020   HGBA1C 6.1 (H) 08/13/2019   HGBA1C 7.1 (H) 07/02/2019   Lab Results  Component Value Date   MICROALBUR 14.2 (H) 07/02/2019   LDLCALC 58 02/23/2020   CREATININE 2.57 (H) 08/19/2020   Lab Results  Component Value Date   FRUCTOSAMINE 241 12/11/2019   FRUCTOSAMINE 276 07/02/2019   FRUCTOSAMINE 276 10/11/2017        Allergies as of 08/23/2020      Reactions   Amoxicillin Rash, Other (See Comments)   Tolerates Cephalosporins Has patient had a PCN reaction causing immediate rash, facial/tongue/throat swelling, SOB or lightheadedness with hypotension: Yes Has patient had a PCN reaction causing severe rash involving mucus membranes or skin necrosis: Yes Has patient had a PCN reaction that  required hospitalization No Has patient had a PCN reaction occurring within the last 10 years: No If all of the above answers are "NO", then may proceed with Cephalosporin use.   Tape Other (See Comments)   Pulls off skin, must use paper tape   Aldactone [spironolactone] Other (See Comments)   CKD/hypokalemia   Arimidex [anastrozole] Nausea Only   Latex Itching, Other (See Comments)   (Dentist office)   Tetracycline Rash      Medication List       Accurate as of August 23, 2020  2:44 PM. If you have any questions, ask your nurse or doctor.        Accu-Chek FastClix Lancets Misc Use to check blood sugar 3 times per day   Accu-Chek Guide Me w/Device Kit Use accu chek meter to check blood sugar three times daily.   Accu-Chek Guide test strip Generic drug: glucose blood Use to test blood sugar 3 times daily   acetaminophen 500 MG tablet Commonly known as: TYLENOL Take 1,000 mg by mouth as needed for moderate pain.   albuterol 108 (90 Base) MCG/ACT inhaler Commonly known as: VENTOLIN HFA Inhale 2 puffs into the lungs every 6 (six) hours as needed for wheezing or shortness of breath.   albuterol (2.5 MG/3ML) 0.083% nebulizer solution Commonly known as: PROVENTIL Take 3 mLs (2.5 mg total) by nebulization every 6 (six) hours as needed for wheezing or shortness of breath.   amiodarone 200 MG tablet Commonly known as: PACERONE TAKE ONE TABLET BY MOUTH DAILY   BD Pen Needle Nano U/F 32G X 4 MM Misc Generic drug: Insulin Pen Needle USE AS INSTRUCTED TO INJECT INSULIN 4 TIMES DAILY.   cetirizine 10 MG tablet Commonly known as: ZYRTEC Take 10 mg by mouth daily.   Dialyvite Vitamin D 5000 125 MCG (5000 UT) capsule Generic drug: Cholecalciferol Take 5,000 Units by mouth daily.   Eliquis 2.5 MG Tabs tablet Generic drug: apixaban Take 1 tablet (2.5 mg total) by mouth 2 (two) times daily. What changed: See the new instructions.   exemestane 25 MG tablet Commonly known as:  AROMASIN TAKE ONE TABLET BY MOUTH DAILY AFTER BREAKFAST What changed: See the new instructions.   ezetimibe 10 MG tablet Commonly known as: ZETIA TAKE ONE TABLET BY MOUTH EVERY DAY   famotidine 20 MG tablet Commonly known as: PEPCID Take 20 mg by mouth daily as needed for heartburn or indigestion.   furosemide 20 MG tablet Commonly known as: LASIX Take 20 mg by mouth daily as needed for fluid.   HumaLOG KwikPen 100  UNIT/ML KwikPen Generic drug: insulin lispro INJECT SUBCUTANEOUSLY 4 TO  6 UNITS 3 TIMES DAILY  BEFORE MEALS What changed: See the new instructions.   insulin glargine 100 UNIT/ML injection Commonly known as: LANTUS Inject 20 Units into the skin daily.   metoprolol succinate 25 MG 24 hr tablet Commonly known as: Toprol XL Take 0.5 tablets (12.5 mg total) by mouth daily.   midodrine 5 MG tablet Commonly known as: PROAMATINE Take 1 tablet (5 mg total) by mouth 3 (three) times daily with meals.   nitroGLYCERIN 0.4 MG SL tablet Commonly known as: NITROSTAT Place 1 tablet (0.4 mg total) under the tongue every 5 (five) minutes as needed for chest pain.   ondansetron 4 MG disintegrating tablet Commonly known as: Zofran ODT Take 1 tablet (4 mg total) by mouth every 8 (eight) hours as needed.   rosuvastatin 40 MG tablet Commonly known as: CRESTOR Take 1 tablet (40 mg total) by mouth at bedtime.   Symbicort 160-4.5 MCG/ACT inhaler Generic drug: budesonide-formoterol INHALE TWO PUFFS INTO THE LUNGS TWICE DAILY What changed: See the new instructions.   Trulicity 1.5 MG/0.5ML Sopn Generic drug: Dulaglutide INJECT THE CONTENTS OF ONE  PEN SUBCUTANEOUSLY WEEKLY  AS DIRECTED What changed: See the new instructions.   vitamin B-12 100 MCG tablet Commonly known as: CYANOCOBALAMIN Take 100 mcg by mouth daily.   zolpidem 5 MG tablet Commonly known as: AMBIEN Take 7.5 mg by mouth at bedtime as needed for sleep.       Allergies:  Allergies  Allergen Reactions   . Amoxicillin Rash and Other (See Comments)    Tolerates Cephalosporins Has patient had a PCN reaction causing immediate rash, facial/tongue/throat swelling, SOB or lightheadedness with hypotension: Yes Has patient had a PCN reaction causing severe rash involving mucus membranes or skin necrosis: Yes Has patient had a PCN reaction that required hospitalization No Has patient had a PCN reaction occurring within the last 10 years: No If all of the above answers are "NO", then may proceed with Cephalosporin use.   . Tape Other (See Comments)    Pulls off skin, must use paper tape  . Aldactone [Spironolactone] Other (See Comments)    CKD/hypokalemia  . Arimidex [Anastrozole] Nausea Only  . Latex Itching and Other (See Comments)    (Dentist office)  . Tetracycline Rash    Past Medical History:  Diagnosis Date  . Abnormally small mouth   . Allergic rhinitis 10/14/2009   Qualifier: Diagnosis of  By: Delton Coombes MD, Les Pou   Overview:  Overview:  Qualifier: Diagnosis of  By: Delton Coombes MD, Les Pou  Last Assessment & Plan:  Please continue Xyzal and Nasacort as you have been using them  . Anemia   . Asthma 05/12/2009   10/12/2014 p extensive coaching HFA effectiveness =    75% s spacer    Overview:  Overview:  10/12/2014 p extensive coaching HFA effectiveness =    75% s spacer   Last Assessment & Plan:  Please continue Symbicort 2 puffs twice a day. Remember to rinse and gargle after taking this medication. Take albuterol 2 puffs up to every 4 hours if needed for shortness of breath.  Follow with Dr Delton Coombes in 6 month  . Bilateral carotid artery stenosis    a. mild 1-39% by duplex 10/2017, due 2021.  . Breast cancer (HCC) 01/18/2016   right breast  . CAD (coronary artery disease) 01/24/2011   a. s/p CABG 1998 with redo 2009.  Marland Kitchen Chemotherapy induced  nausea and vomiting 02/24/2016  . Chemotherapy-induced peripheral neuropathy ([place]) 04/27/2016  . 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 Chronic  diastolic CHF (congestive heart failure) ([place]) 09/24/2013  . CKD (chronic kidney disease), stage IV ([place]) 09/24/2013  . Closed fracture of head of left humerus 09/09/2017  . Complication of anesthesia   . Dysrhythmia    a-fib  . Gallstones   . GERD (gastroesophageal reflux disease)   . Glaucoma   . H/O atrial tachycardia 05/12/2009   Qualifier: History of  By: Lamonte Sakai MD, Rose Fillers   Overview:  Overview:  Qualifier: History of  By: Lamonte Sakai MD, Rose Fillers  Last Assessment & Plan:  There was no evidence of this on her cardiac monitor. I would recommend watchful waiting.   Marland Kitchen Heart murmur   . History of kidney stones   . History of non-ST elevation myocardial infarction (NSTEMI)    Sept 2014--  thought to be type II HTN w/ LHC without infarct related artery and patent grafts  . History of radiation therapy 05/24/16-07/26/16   right breast 50.4 Gy in 28 fractions, right breast boost 10 Gy in 5 fractions  . Hyperlipidemia   . Hypotension   . Iron deficiency anemia   . Mania ([place]) 03/05/2016  . Moderate persistent asthma    pulmologist-  Dr. Malvin Johns  . Osteomyelitis of toe of left foot ([place]) 04/06/2016  . Osteopenia of multiple sites 10/19/2015  . PAF (paroxysmal atrial fibrillation) ([place])   . Personal history of chemotherapy 2017  . Personal history of radiation therapy 2017  . PONV (postoperative nausea and vomiting)   . Port catheter in place 02/17/2016  . Psoriasis    right leg  . Renal calculus, right   . S/P AVR    prosthesis valve placement 2009 at same time re-do CABG  . Seizure-like activity ([facility]) 09/01/2017  . Sensorineural hearing loss (SNHL) of both ears 01/06/2016  . Stroke [facility]) 2014   residual rt hearing loss  . Syncope 09/06/2017  . Type 2 diabetes mellitus ([place])    monitored by dr Dwyane Dee    Past Surgical History:  Procedure Laterality Date  . AORTIC VALVE REPLACEMENT  2009   [facility] same time is [facility]  . BREAST BIOPSY Right 02/12/2019   2  lymphnodes, 1 breast  . BREAST LUMPECTOMY Right 01/18/2016  . BREAST LUMPECTOMY WITH RADIOACTIVE SEED AND SENTINEL LYMPH NODE BIOPSY Right 01/18/2016   Procedure: RIGHT BREAST LUMPECTOMY WITH RADIOACTIVE SEED AND SENTINEL LYMPH NODE BIOPSY;  Surgeon: DAlphonsa Overall MD;  [facility] [facility]  Service: General;  Laterality: Right;  . CARDIAC CATHETERIZATION  03/23/2008   Pre-redo CABG: L main OK, LAD (T), CFX (T), OM1 99%, RCA (T), LIMA-LAD OK, SVG-OM(?3) OK w/ little florw to OM2, SVG-RCA OK. EF NL  . CARPAL TUNNEL RELEASE    . CHEST TUBE INSERTION Right 06/26/2019   Procedure: INSERTION PLEURAL DRAINAGE CATHETER;  Surgeon: BGaye Pollack MD;  [facility]  Service: Thoracic;  Laterality: Right;  . CHOLECYSTECTOMY N/A 07/07/2018   Procedure: LAPAROSCOPIC CHOLECYSTECTOMY WITH INTRAOPERATIVE CHOLANGIOGRAM ERAS PATHWAY;  Surgeon: NAlphonsa Overall MD;  Location[facility];  Service: General;  Laterality: N/A;  . COLONOSCOPY     around 2015. Possibly with [facility]   . CORONARY ARTERY BYPASS GRAFT  1998 &  re-do 2009   Had LIMA to DX/LAD, SV[facility] 2 marginal [facility] SVG to 3rd OM and PD at time of redo  .  CYSTOSCOPY W/ URETERAL STENT PLACEMENT Right 12/20/2014   Procedure: CYSTOSCOPY WITH RETROGRADE PYELOGRAM/URETERAL STENT PLACEMENT;  Surgeon: Cleon Gustin, MD;  Location: [facility];  Service: Urology;  Laterality: Right;  . CYSTOSCOPY WITH RETROGRADE PYELOGRAM, URETEROSCOPY AND STENT PLACEMENT Left 08/21/2019   Procedure: CYSTOSCOPY WITH RETROGRADE PYELOGRAM, URETEROSCOPY AND STENT PLACEMENT;  Surgeon: Cleon Gustin, MD;  [facility];  Service: Urology;  Laterality: Left;  1 HR  . ESOPHAGOGASTRODUODENOSCOPY     many years ago per patient   . ESOPHAGOGASTRODUODENOSCOPY ENDOSCOPY  06/17/2018  . EYE SURGERY Bilateral    cataracts  . HOLMIUM LASER APPLICATION Right 4/33/2951   Procedure:  HOLMIUM LASER LITHOTRIPSY;  Surgeon: Cleon Gustin, MD;   Location: [facility];  Service: Urology;  Laterality: Right;  . HOLMIUM LASER APPLICATION Left 8/84/1660   Procedure: HOLMIUM LASER APPLICATION;  Surgeon: Cleon Gustin, MD;  [facility];  Service: Urology;  Laterality: Left;  . IR KYPHO LUMBAR INC FX REDUCE BONE BX UNI/BIL CANNULATION INC/IMAGING  02/12/2020  . IR THORACENTESIS ASP PLEURAL SPACE W/IMG GUIDE  10/03/2018  . IR THORACENTESIS ASP PLEURAL SPACE W/IMG GUIDE  06/08/2019  . LEFT HEART CATHETERIZATION WITH CORONARY/GRAFT ANGIOGRAM N/A 02/23/2013   Procedure: LEFT HEART CATHETERIZATION WITH Beatrix Fetters;  Surgeon: Blane Ohara, MD;  Location: [facility] CATH LAB;  Service: Cardiovascular;  Laterality: N/A;  . LOOP RECORDER INSERTION N/A 08/30/2017   Procedure: LOOP RECORDER INSERTION;  Surgeon: Evans Lance, MD;  Location: [place] CV LAB;  Service: Cardiovascular;  Laterality: N/A;  . LOOP RECORDER REMOVAL N/A 08/10/2020   Procedure: LOOP RECORDER REMOVAL;  Surgeon: Evans Lance, MD;  Location: [place] CV LAB;  Service: Cardiovascular;  Laterality: N/A;  . PACEMAKER IMPLANT N/A 08/10/2020   Procedure: PACEMAKER IMPLANT;  Surgeon: Evans Lance, MD;  Location: [place] CV LAB;  Service: Cardiovascular;  Laterality: N/A;  . PORTACATH PLACEMENT Left 01/18/2016   Procedure: INSERTION PORT-A-CATH;  Surgeon: Alphonsa Overall, MD;  Location: [place];  Service: General;  Laterality: Left;  . portacath removal    . REMOVAL OF PLEURAL DRAINAGE CATHETER Right 07/14/2019   Procedure: REMOVAL OF PLEURAL DRAINAGE CATHETER;  Surgeon: Gaye Pollack, MD;  Location: [place];  Service: Thoracic;  Laterality: Right;  . TALC PLEURODESIS N/A 06/26/2019   Procedure: Pietro Cassis;  Surgeon: Gaye Pollack, MD;  Location[facility];  Service: Thoracic;  Laterality: N/A;  . TONSILLECTOMY    . TRANSTHORACIC ECHOCARDIOGRAM  02-24-2013      mild LVH,  ef 50-55%/  AV bioprosthesis was present with very mild stenosis and no  regurg., mean grandient 56mHg, peak grandient 243mg /  mild MR/  mild LAE and RAE/  moderate TR  . TUBAL LIGATION      Family History  Problem Relation Age of Onset  . Heart disease Father   . Heart failure Father   . Diabetes Maternal Grandmother   . Heart disease Maternal Grandmother   . Diabetes Son   . Healthy Brother        #1  . Heart attack Brother        #2  . Heart disease Brother        #2  . Colon cancer Neg Hx   . Esophageal cancer Neg Hx     Social History:  reports that she has never smoked. She has never used smokeless tobacco. She reports that she does not drink alcohol and  does not use drugs.    Review of Systems    Lipid history: On 40 mg Crestor along with Zetia      Lab Results  Component Value Date   CHOL 106 02/23/2020   HDL 33 (L) 02/23/2020   LDLCALC 58 02/23/2020   LDLDIRECT 53.0 10/08/2016   TRIG 74 02/23/2020   CHOLHDL 3.2 02/23/2020           Eyes:  No history of retinopathy  She is followed by nephrologist for renal dysfunction which shows variable results   Lab Results  Component Value Date   CREATININE 2.57 (H) 08/19/2020   CREATININE 2.89 (H) 08/02/2020   CREATININE 2.83 (H) 07/22/2020    Diabetic foot exam in 3/20: She has significant loss of monofilament sensation in her distal feet She thinks her neuropathy started after her chemotherapy She has difficulty with balance She has significant numbness but no pain or paresthesia  BLOOD pressure: She has variable blood pressure readings as below  BP Readings from Last 3 Encounters:  08/23/20 (!) 110/58  08/19/20 (!) 134/47  08/10/20 110/60   She is on amiodarone 200 mg, thyroid levels have been normal  Lab Results  Component Value Date   TSH 3.247 08/02/2020      Physical Examination:  BP (!) 110/58 (BP Location: Right Arm, Patient Position: Sitting, Cuff Size: Normal)   Pulse 68   Resp 20   Ht _0  (1.626 m)   Wt 136 lb (61.7 kg)   LMP  (LMP Unknown)    SpO2 98%   BMI 23.34 kg/m     ASSESSMENT:  Diabetes type 2, on insulin    See history of present illness for detailed discussion of  current management, blood sugar patterns and problems identified  Blood sugar prior readings, readings and averages reviewed from her meter and compared to previous data  A1c is 7% but inaccurate because of anemia  Currently on basal bolus insulin injections and Trulicity  She has marked postprandial hyperglycemia and appears to be more insulin deficient but also may be indirectly from steroids causing insulin resistance Has not increase her mealtime dose as needed and does not appear to be concerned about her high sugars  PLAN:    She will increase her Humalog from 6 to 8 units to at least 10-12 units for meals She can continue to take about 6 to 8 units for breakfast meals and large snacks 22 Lantus daily instead of 20 To go up further if morning sugars are over 130 consistently Consider going back to the V-go pump Also will consider doing continuous glucose monitoring but she is reluctant to have a device Stop to her  And make sure she takes her insulin doses right at mealtimes and not late  She will call if she has inconsistent blood sugars Discussed blood sugar targets fasting and after meals  Chair exercises recommended Continue 1.5 mg Trulicity, consider increasing the dose   Patient Instructions  10-12 Humalog with lunch and supper, 6-7  for large snack  22 lantus and keep am sugar <130      Total visit time including counseling =30 minutes  Elayne Snare 08/23/2020, 2:44 PM   Note: This office note was prepared with Dragon voice recognition system technology. Any transcriptional errors that result from this process are unintentional.

## 2020-08-23 NOTE — Patient Instructions (Addendum)
10-12 Humalog with lunch and supper, 6-7  for large snack  22 lantus and keep am sugar <130

## 2020-08-24 ENCOUNTER — Encounter (HOSPITAL_COMMUNITY)
Admission: RE | Admit: 2020-08-24 | Discharge: 2020-08-24 | Disposition: A | Discharge status: Home / Self Care | Payer: Medicare Other | Source: Ambulatory Visit | Attending: Cardiology | Admitting: Cardiology

## 2020-08-24 ENCOUNTER — Telehealth: Payer: Self-pay

## 2020-08-24 DIAGNOSIS — E859 Amyloidosis, unspecified: Secondary | ICD-10-CM | POA: Diagnosis present

## 2020-08-24 MED ORDER — TECHNETIUM TC 99M PYROPHOSPHATE
21.0000 | Freq: Once | INTRAVENOUS | Status: AC | PRN
  Administered 2020-08-24: 21 via INTRAVENOUS
  Filled 2020-08-24: qty 21

## 2020-08-24 NOTE — Telephone Encounter (Signed)
The pt states she had her steri strips removed yesterday and now there is a knot where her incision site is. I let her speak with Amy, rn.

## 2020-08-24 NOTE — Telephone Encounter (Signed)
Pt in office yesterday for wound check.  Steri strips removed, no s/s of complications noted.  Pt reports she noticed a "knot" right above her incision.  It is fixed (not moveable)  She denies any redness or any other swelling.  No drainage reported.  She is not home now, but will take a picture to send in via [place] when she gets home,

## 2020-08-29 ENCOUNTER — Other Ambulatory Visit: Payer: Self-pay

## 2020-08-30 ENCOUNTER — Other Ambulatory Visit: Payer: Self-pay | Admitting: Internal Medicine

## 2020-09-03 ENCOUNTER — Other Ambulatory Visit: Payer: Self-pay | Admitting: Endocrinology

## 2020-09-06 ENCOUNTER — Other Ambulatory Visit: Payer: Self-pay | Admitting: Endocrinology

## 2020-09-06 ENCOUNTER — Other Ambulatory Visit: Payer: Self-pay | Admitting: Hematology and Oncology

## 2020-09-07 ENCOUNTER — Encounter (HOSPITAL_COMMUNITY): Payer: Self-pay

## 2020-09-14 ENCOUNTER — Other Ambulatory Visit (HOSPITAL_COMMUNITY): Payer: Self-pay

## 2020-09-15 ENCOUNTER — Other Ambulatory Visit: Payer: Self-pay

## 2020-09-15 ENCOUNTER — Telehealth: Payer: Self-pay

## 2020-09-15 ENCOUNTER — Other Ambulatory Visit (HOSPITAL_COMMUNITY): Payer: Self-pay | Admitting: Student

## 2020-09-15 ENCOUNTER — Ambulatory Visit (INDEPENDENT_AMBULATORY_CARE_PROVIDER_SITE_OTHER): Payer: Medicare Other | Admitting: Emergency Medicine

## 2020-09-15 ENCOUNTER — Ambulatory Visit (HOSPITAL_COMMUNITY): Payer: Medicare Other | Attending: Cardiovascular Disease

## 2020-09-15 DIAGNOSIS — I313 Pericardial effusion (noninflammatory): Secondary | ICD-10-CM | POA: Diagnosis not present

## 2020-09-15 DIAGNOSIS — Z950 Presence of cardiac pacemaker: Secondary | ICD-10-CM

## 2020-09-15 DIAGNOSIS — I3139 Other pericardial effusion (noninflammatory): Secondary | ICD-10-CM

## 2020-09-15 DIAGNOSIS — R55 Syncope and collapse: Secondary | ICD-10-CM

## 2020-09-15 LAB — CUP PACEART INCLINIC DEVICE CHECK
Date Time Interrogation Session: 20220407131725
Implantable Lead Implant Date: 20220302
Implantable Lead Implant Date: 20220302
Implantable Lead Location: 753859
Implantable Lead Location: 753860
Implantable Lead Model: 377171
Implantable Lead Model: 377171
Implantable Lead Serial Number: 8000005446
Implantable Lead Serial Number: 8000143627
Implantable Pulse Generator Implant Date: 20220302
Lead Channel Impedance Value: 507 Ohm
Lead Channel Impedance Value: 663 Ohm
Lead Channel Pacing Threshold Amplitude: 0.8 V
Lead Channel Pacing Threshold Amplitude: 0.8 V
Lead Channel Pacing Threshold Amplitude: 0.8 V
Lead Channel Pacing Threshold Amplitude: 1 V
Lead Channel Pacing Threshold Amplitude: 1 V
Lead Channel Pacing Threshold Pulse Width: 0.4 ms
Lead Channel Pacing Threshold Pulse Width: 0.4 ms
Lead Channel Pacing Threshold Pulse Width: 0.4 ms
Lead Channel Pacing Threshold Pulse Width: 0.4 ms
Lead Channel Pacing Threshold Pulse Width: 0.4 ms
Lead Channel Sensing Intrinsic Amplitude: 10.6 mV
Lead Channel Sensing Intrinsic Amplitude: 10.8 mV
Lead Channel Sensing Intrinsic Amplitude: 3.2 mV
Lead Channel Sensing Intrinsic Amplitude: 3.3 mV
Lead Channel Setting Pacing Amplitude: 3 V
Lead Channel Setting Pacing Amplitude: 3 V
Lead Channel Setting Pacing Pulse Width: 0.4 ms
Pulse Gen Model: 407145
Pulse Gen Serial Number: 70049081

## 2020-09-15 LAB — ECHOCARDIOGRAM LIMITED
AR max vel: 1.12 cm2
AV Area VTI: 1.23 cm2
AV Area mean vel: 1.18 cm2
AV Mean grad: 11 mmHg
AV Peak grad: 21.3 mmHg
Ao pk vel: 2.31 m/s
Area-P 1/2: 3.61 cm2
S' Lateral: 2.9 cm

## 2020-09-15 NOTE — Telephone Encounter (Signed)
Patient called reporting of mid substernal chest pain (mainly with inspiration) that started last night at 22:00. Reports also of palpitations and nausea. Attempted to contact Dr. Lovena Le, unsuccessful. Contacted A. Tillery, PA and request patient come into the clinic to have device checked and limited ECHO completed . Patient may also take ibuprofen 400 mg 2-3 times a day with food. Patient scheduled in device clinic 09/15/20 @ 9:20. Adam from [facility] made aware and will be present for check. Discussed with patient location, date and time of apt with verbal understanding.

## 2020-09-15 NOTE — Progress Notes (Signed)
Pt in office for PPM check and Echo per Dr. Lovena Le.    Pt reports starting at about 10pm last night, mid sternal pain on inspiration, rate 3/10.  Pt reports she did not sleep well due to the pain.  Echo completed with results pending to Dr. Lovena Le.  Device checked in clinic by [facility].  Normal function noted, nothing showing on transmission to explain symptoms.   Per Dr. Lovena Le, pt to take Ibuprofen 400mg  every 8 hours as needed for pain.

## 2020-09-15 NOTE — Telephone Encounter (Signed)
Patient called in stating her heart rate has been high and she has been having some chest pain when she takes a deep breath

## 2020-09-15 NOTE — Patient Instructions (Signed)
Take Ibuprofen 400mg  every 8 hours as needed for pain.

## 2020-09-15 NOTE — Addendum Note (Signed)
Addended by: Trena Platt E on: 09/15/2020 01:55 PM   Modules accepted: Level of Service

## 2020-09-16 ENCOUNTER — Other Ambulatory Visit (HOSPITAL_COMMUNITY): Payer: Self-pay | Admitting: Cardiology

## 2020-09-16 ENCOUNTER — Inpatient Hospital Stay: Payer: Medicare Other | Attending: Adult Health

## 2020-09-16 ENCOUNTER — Inpatient Hospital Stay: Payer: Medicare Other

## 2020-09-16 VITALS — BP 157/57 | HR 68 | Temp 98.7°F | Resp 17

## 2020-09-16 DIAGNOSIS — Z170 Estrogen receptor positive status [ER+]: Secondary | ICD-10-CM

## 2020-09-16 DIAGNOSIS — I129 Hypertensive chronic kidney disease with stage 1 through stage 4 chronic kidney disease, or unspecified chronic kidney disease: Secondary | ICD-10-CM | POA: Diagnosis present

## 2020-09-16 DIAGNOSIS — Z79899 Other long term (current) drug therapy: Secondary | ICD-10-CM | POA: Diagnosis not present

## 2020-09-16 DIAGNOSIS — N189 Chronic kidney disease, unspecified: Secondary | ICD-10-CM | POA: Diagnosis not present

## 2020-09-16 DIAGNOSIS — D631 Anemia in chronic kidney disease: Secondary | ICD-10-CM | POA: Diagnosis not present

## 2020-09-16 DIAGNOSIS — C50211 Malignant neoplasm of upper-inner quadrant of right female breast: Secondary | ICD-10-CM

## 2020-09-16 DIAGNOSIS — D638 Anemia in other chronic diseases classified elsewhere: Secondary | ICD-10-CM

## 2020-09-16 LAB — CMP (CANCER CENTER ONLY)
ALT: 26 U/L (ref 0–44)
AST: 29 U/L (ref 15–41)
Albumin: 2.9 g/dL — ABNORMAL LOW (ref 3.5–5.0)
Alkaline Phosphatase: 58 U/L (ref 38–126)
Anion gap: 10 (ref 5–15)
BUN: 39 mg/dL — ABNORMAL HIGH (ref 8–23)
CO2: 26 mmol/L (ref 22–32)
Calcium: 9 mg/dL (ref 8.9–10.3)
Chloride: 106 mmol/L (ref 98–111)
Creatinine: 2.48 mg/dL — ABNORMAL HIGH (ref 0.44–1.00)
GFR, Estimated: 20 mL/min — ABNORMAL LOW (ref >60)
Glucose, Bld: 149 mg/dL — ABNORMAL HIGH (ref 70–99)
Potassium: 4.9 mmol/L (ref 3.5–5.1)
Sodium: 142 mmol/L (ref 135–145)
Total Bilirubin: 0.6 mg/dL (ref 0.3–1.2)
Total Protein: 6.4 g/dL — ABNORMAL LOW (ref 6.5–8.1)

## 2020-09-16 LAB — CBC WITH DIFFERENTIAL (CANCER CENTER ONLY)
Abs Immature Granulocytes: 0.01 10*3/uL (ref 0.00–0.07)
Basophils Absolute: 0.1 10*3/uL (ref 0.0–0.1)
Basophils Relative: 1 %
Eosinophils Absolute: 0.5 10*3/uL (ref 0.0–0.5)
Eosinophils Relative: 9 %
HCT: 32.5 % — ABNORMAL LOW (ref 36.0–46.0)
Hemoglobin: 9.7 g/dL — ABNORMAL LOW (ref 12.0–15.0)
Immature Granulocytes: 0 %
Lymphocytes Relative: 12 %
Lymphs Abs: 0.7 10*3/uL (ref 0.7–4.0)
MCH: 28.3 pg (ref 26.0–34.0)
MCHC: 29.8 g/dL — ABNORMAL LOW (ref 30.0–36.0)
MCV: 94.8 fL (ref 80.0–100.0)
Monocytes Absolute: 0.5 10*3/uL (ref 0.1–1.0)
Monocytes Relative: 9 %
Neutro Abs: 4 10*3/uL (ref 1.7–7.7)
Neutrophils Relative %: 69 %
Platelet Count: 143 10*3/uL — ABNORMAL LOW (ref 150–400)
RBC: 3.43 MIL/uL — ABNORMAL LOW (ref 3.87–5.11)
RDW: 15.9 % — ABNORMAL HIGH (ref 11.5–15.5)
WBC Count: 5.8 10*3/uL (ref 4.0–10.5)
nRBC: 0 % (ref 0.0–0.2)

## 2020-09-16 LAB — IRON AND TIBC
Iron: 141 ug/dL (ref 41–142)
Saturation Ratios: 50 % (ref 21–57)
TIBC: 284 ug/dL (ref 236–444)
UIBC: 143 ug/dL (ref 120–384)

## 2020-09-16 LAB — SAMPLE TO BLOOD BANK

## 2020-09-16 LAB — RETIC PANEL
Immature Retic Fract: 11.6 % (ref 2.3–15.9)
RBC.: 3.41 MIL/uL — ABNORMAL LOW (ref 3.87–5.11)
Retic Count, Absolute: 29.3 10*3/uL (ref 19.0–186.0)
Retic Ct Pct: 0.9 % (ref 0.4–3.1)
Reticulocyte Hemoglobin: 34.4 pg (ref >27.9)

## 2020-09-16 LAB — FERRITIN: Ferritin: 57 ng/mL (ref 11–307)

## 2020-09-16 MED ORDER — DARBEPOETIN ALFA 500 MCG/ML IJ SOSY
PREFILLED_SYRINGE | INTRAMUSCULAR | Status: AC
  Filled 2020-09-16: qty 1

## 2020-09-16 MED ORDER — DARBEPOETIN ALFA 500 MCG/ML IJ SOSY
500.0000 ug | PREFILLED_SYRINGE | Freq: Once | INTRAMUSCULAR | Status: AC
  Administered 2020-09-16: 500 ug via SUBCUTANEOUS

## 2020-09-16 NOTE — Patient Instructions (Signed)
Darbepoetin Alfa injection What is this medicine? DARBEPOETIN ALFA (dar be POE e tin AL fa) helps your body make more red blood cells. It is used to treat anemia caused by chronic kidney failure and chemotherapy. This medicine may be used for other purposes; ask your health care provider or pharmacist if you have questions. COMMON BRAND NAME(S): Aranesp What should I tell my health care provider before I take this medicine? They need to know if you have any of these conditions:  blood clotting disorders or history of blood clots  cancer patient not on chemotherapy  cystic fibrosis  heart disease, such as angina, heart failure, or a history of a heart attack  hemoglobin level of 12 g/dL or greater  high blood pressure  low levels of folate, iron, or vitamin B12  seizures  an unusual or allergic reaction to darbepoetin, erythropoietin, albumin, hamster proteins, latex, other medicines, foods, dyes, or preservatives  pregnant or trying to get pregnant  breast-feeding How should I use this medicine? This medicine is for injection into a vein or under the skin. It is usually given by a health care professional in a hospital or clinic setting. If you get this medicine at home, you will be taught how to prepare and give this medicine. Use exactly as directed. Take your medicine at regular intervals. Do not take your medicine more often than directed. It is important that you put your used needles and syringes in a special sharps container. Do not put them in a trash can. If you do not have a sharps container, call your pharmacist or healthcare provider to get one. A special MedGuide will be given to you by the pharmacist with each prescription and refill. Be sure to read this information carefully each time. Talk to your pediatrician regarding the use of this medicine in children. While this medicine may be used in children as young as 1 month of age for selected conditions, precautions do  apply. Overdosage: If you think you have taken too much of this medicine contact a poison control center or emergency room at once. NOTE: This medicine is only for you. Do not share this medicine with others. What if I miss a dose? If you miss a dose, take it as soon as you can. If it is almost time for your next dose, take only that dose. Do not take double or extra doses. What may interact with this medicine? Do not take this medicine with any of the following medications:  epoetin alfa This list may not describe all possible interactions. Give your health care provider a list of all the medicines, herbs, non-prescription drugs, or dietary supplements you use. Also tell them if you smoke, drink alcohol, or use illegal drugs. Some items may interact with your medicine. What should I watch for while using this medicine? Your condition will be monitored carefully while you are receiving this medicine. You may need blood work done while you are taking this medicine. This medicine may cause a decrease in vitamin B6. You should make sure that you get enough vitamin B6 while you are taking this medicine. Discuss the foods you eat and the vitamins you take with your health care professional. What side effects may I notice from receiving this medicine? Side effects that you should report to your doctor or health care professional as soon as possible:  allergic reactions like skin rash, itching or hives, swelling of the face, lips, or tongue  breathing problems  changes in   vision  chest pain  confusion, trouble speaking or understanding  feeling faint or lightheaded, falls  high blood pressure  muscle aches or pains  pain, swelling, warmth in the leg  rapid weight gain  severe headaches  sudden numbness or weakness of the face, arm or leg  trouble walking, dizziness, loss of balance or coordination  seizures (convulsions)  swelling of the ankles, feet, hands  unusually weak or  tired Side effects that usually do not require medical attention (report to your doctor or health care professional if they continue or are bothersome):  diarrhea  fever, chills (flu-like symptoms)  headaches  nausea, vomiting  redness, stinging, or swelling at site where injected This list may not describe all possible side effects. Call your doctor for medical advice about side effects. You may report side effects to FDA at 1-800-FDA-1088. Where should I keep my medicine? Keep out of the reach of children. Store in a refrigerator between 2 and 8 degrees C (36 and 46 degrees F). Do not freeze. Do not shake. Throw away any unused portion if using a single-dose vial. Throw away any unused medicine after the expiration date. NOTE: This sheet is a summary. It may not cover all possible information. If you have questions about this medicine, talk to your doctor, pharmacist, or health care provider.  2021 Elsevier/Gold Standard (2017-06-12 16:44:20)  

## 2020-09-20 ENCOUNTER — Telehealth (HOSPITAL_COMMUNITY): Payer: Self-pay | Admitting: *Deleted

## 2020-09-20 MED ORDER — ISOSORBIDE MONONITRATE ER 30 MG PO TB24: 30.0000 mg | ORAL_TABLET | Freq: Every day | ORAL | 3 refills | Status: DC

## 2020-09-20 NOTE — Telephone Encounter (Signed)
-----   Message from Larey Dresser, MD sent at 09/20/2020  3:34 PM EDT ----- 1. Start Imdur 30 mg daily.  2. May need repeat Cardiolite.  Reasonable to let her try Imdur for 1 week before ordering to see if symptoms improve (just had normal Cardiolite in 11/21, poor cath candidate with CKD).

## 2020-09-20 NOTE — Telephone Encounter (Signed)
Pt aware and agreeable with plan via mychart message from [place]. IMDUR sent to pharmacy .

## 2020-09-21 ENCOUNTER — Encounter: Payer: Self-pay | Admitting: Hematology and Oncology

## 2020-09-30 IMAGING — DX CHEST - 2 VIEW
2 series · 2 of 2 positions shown · non-contrast
Comparison: 12/02/2018

CLINICAL DATA: Follow-up pleural effusion, history of prior
thoracentesis

EXAM:
CHEST - 2 VIEW

[chest pa]
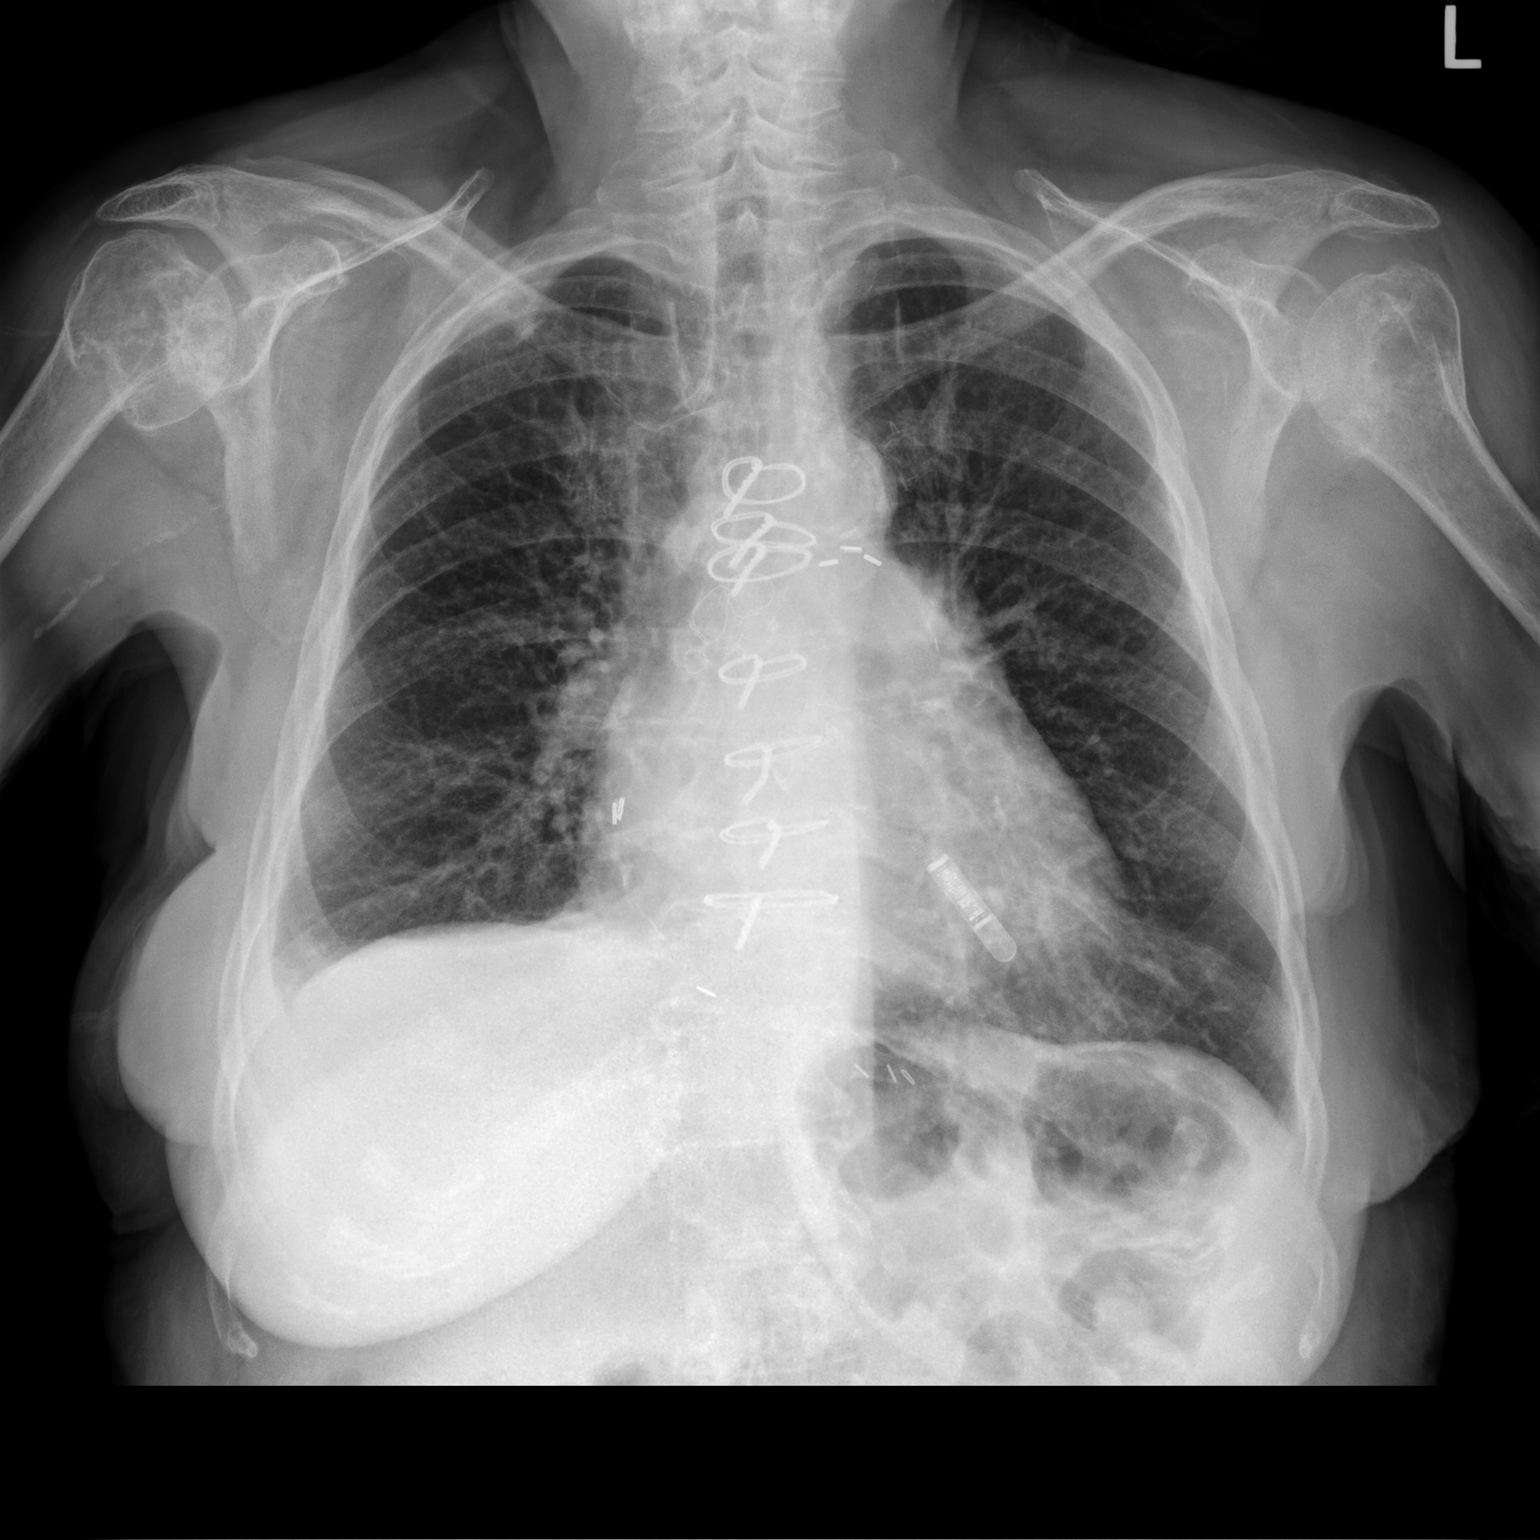

[chest lat]
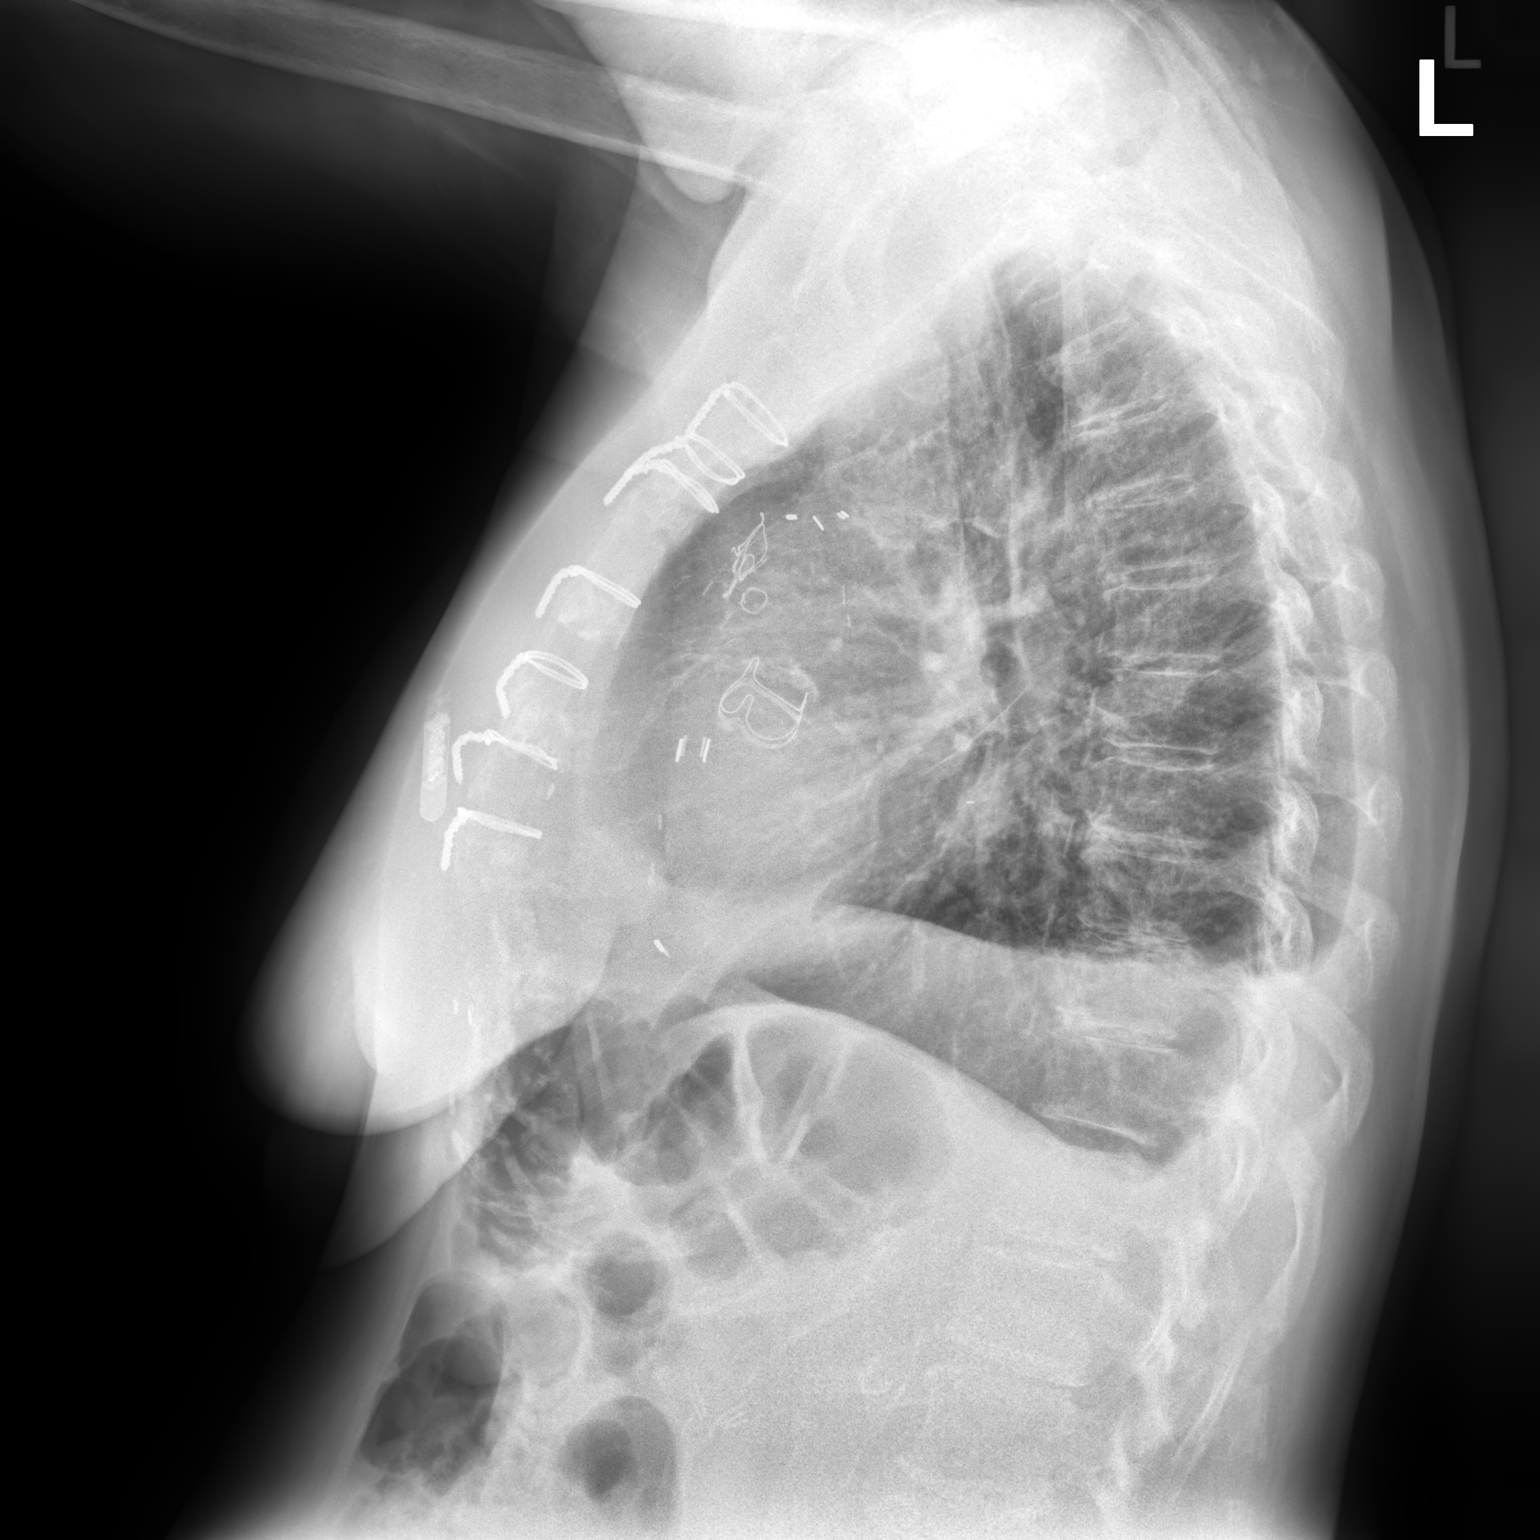

[2 of 2 positions shown; findings below may reference images not displayed]

FINDINGS: Cardiac shadow is within normal limits. Postsurgical changes are
seen. Loop recorder is again noted. Aortic calcifications are noted.
The lungs are well aerated with small right-sided pleural effusion
slightly increased when compare with the prior exam. No focal
infiltrate is seen. No bony abnormality is noted.
IMPRESSION: Small right pleural effusion slightly increased from the prior
study.

## 2020-10-06 IMAGING — CR CHEST - 2 VIEW
2 series · 2 of 2 positions shown · non-contrast
Comparison: 01/13/2019

CLINICAL DATA: Follow-up right-sided pleural effusion.

EXAM:
CHEST - 2 VIEW

[w chest pa]
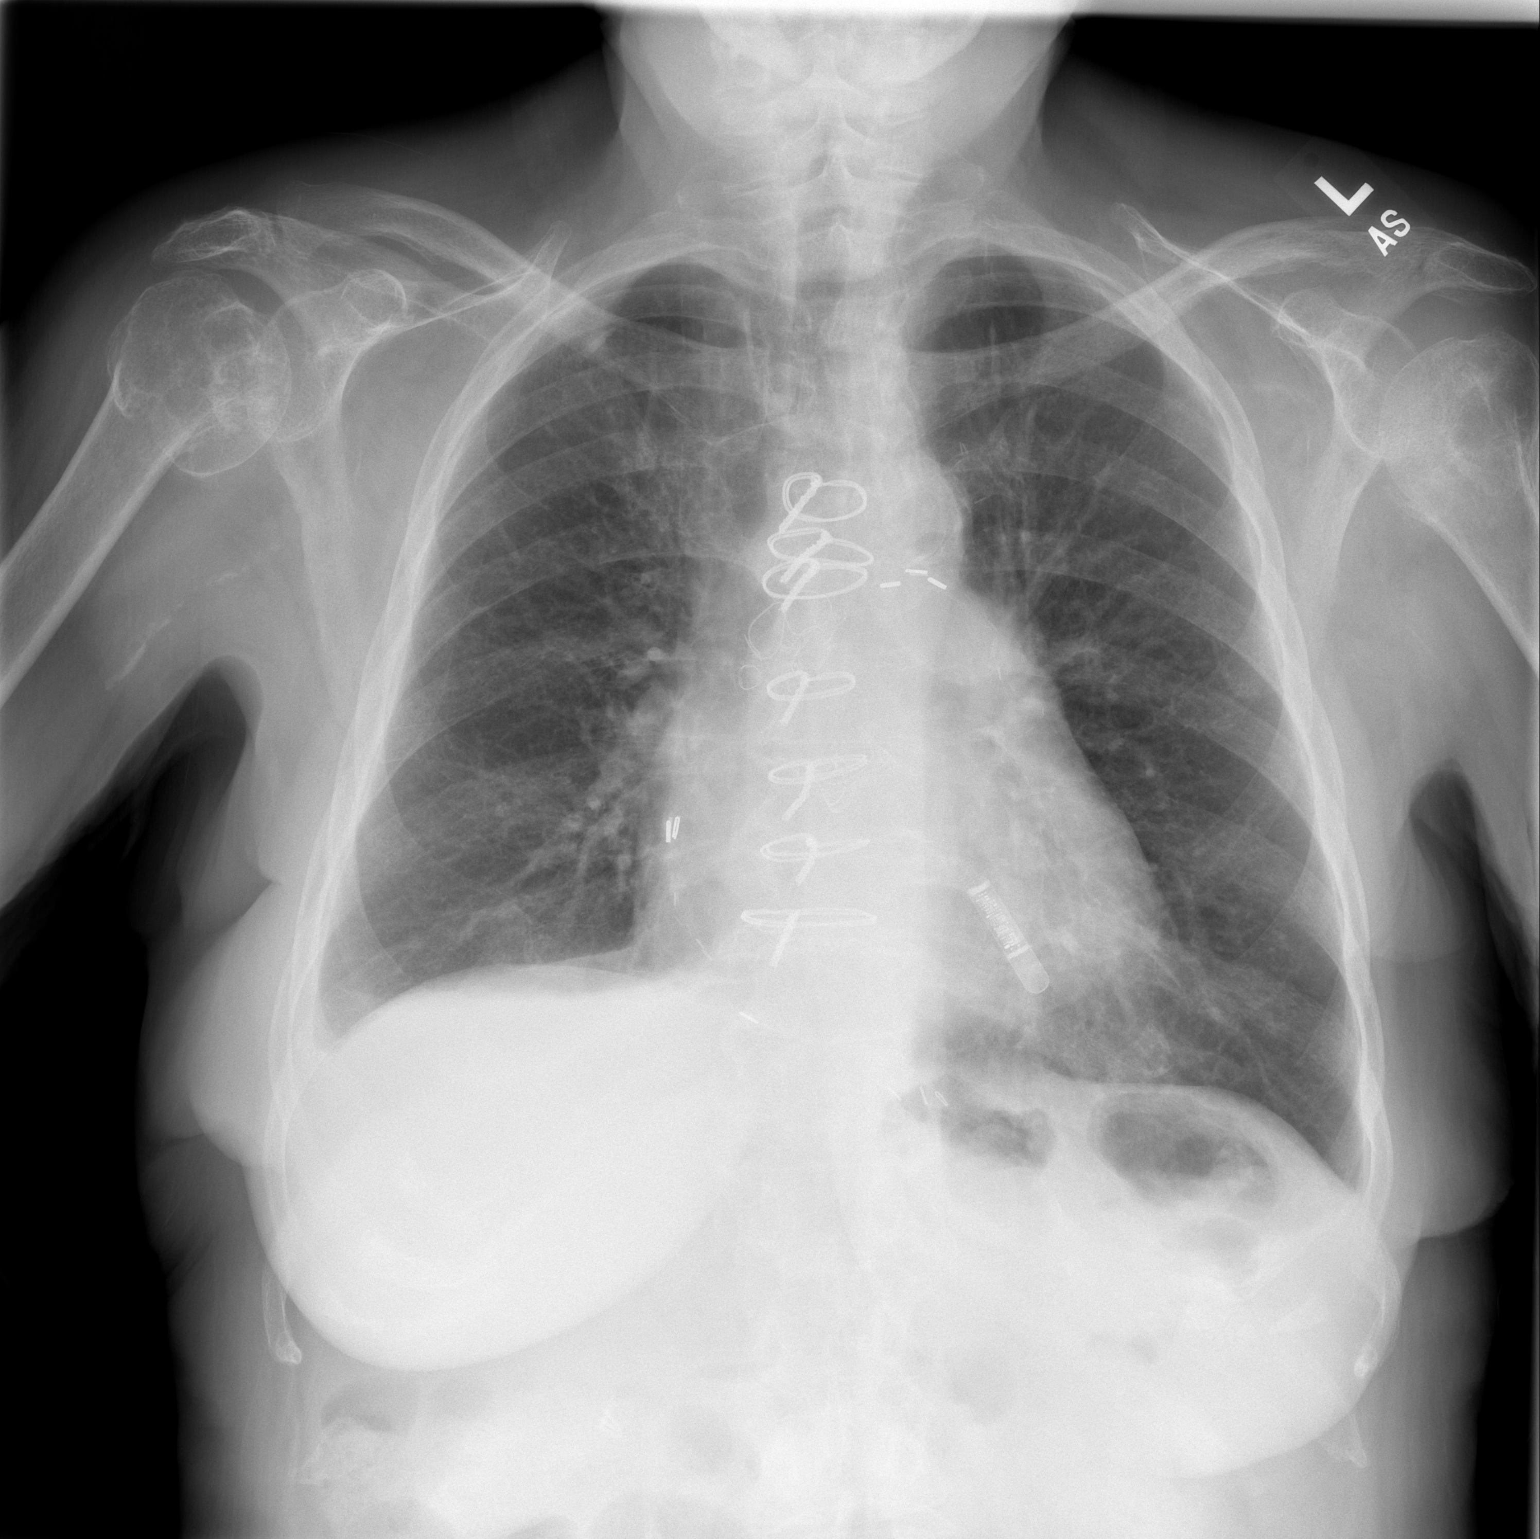

[w chest lat]
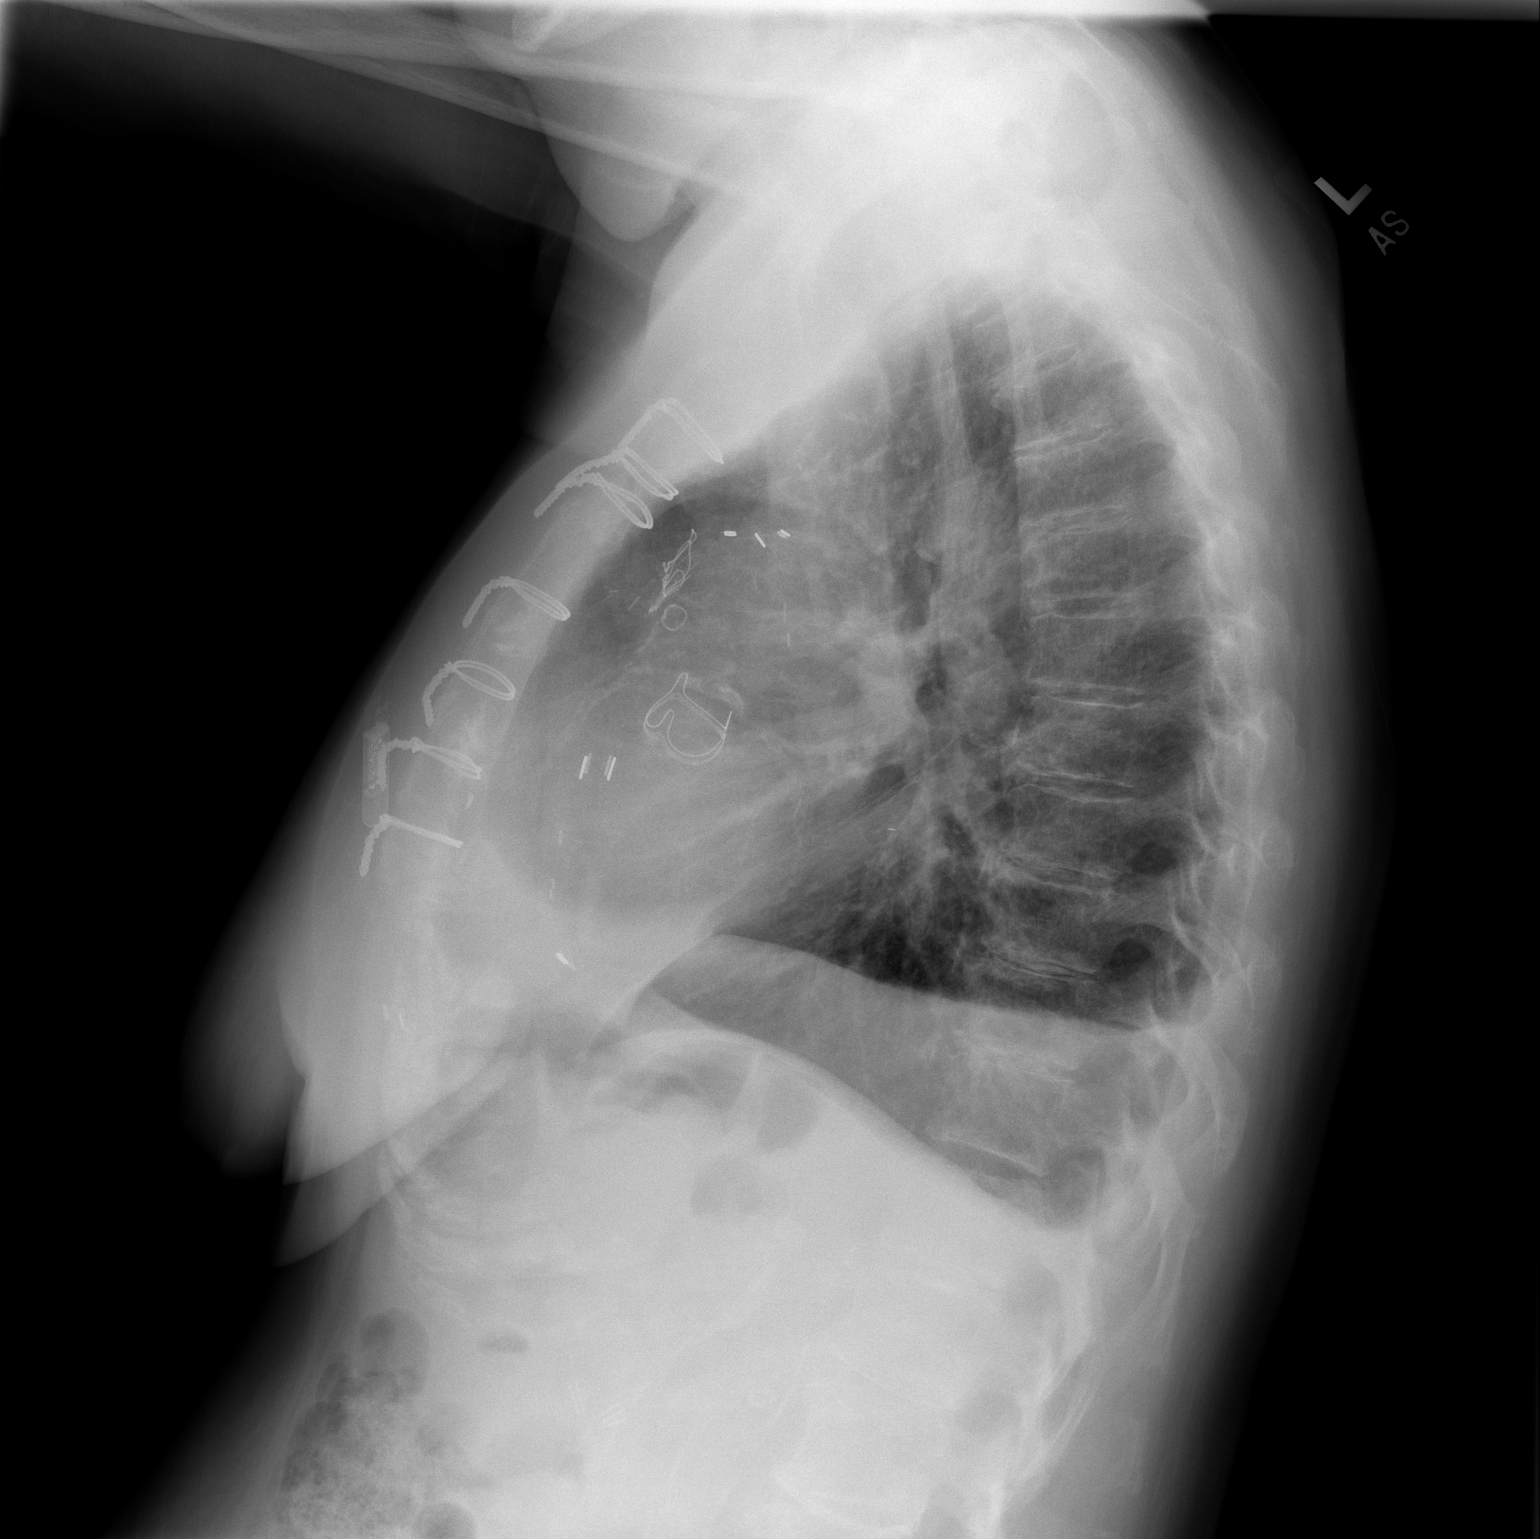

[2 of 2 positions shown; findings below may reference images not displayed]

FINDINGS: Small right pleural effusion is again identified and stable. Small
left-sided pleural effusion is noted posteriorly. The overall
appearance is stable. Postsurgical changes are seen. No acute bony
abnormality is noted.
IMPRESSION: Small pleural effusions without acute abnormality.

## 2020-10-07 ENCOUNTER — Ambulatory Visit
Admission: RE | Admit: 2020-10-07 | Discharge: 2020-10-07 | Disposition: A | Discharge status: Home / Self Care | Payer: Medicare Other | Source: Ambulatory Visit | Attending: Hematology and Oncology | Admitting: Hematology and Oncology

## 2020-10-07 ENCOUNTER — Other Ambulatory Visit: Payer: Self-pay

## 2020-10-07 ENCOUNTER — Other Ambulatory Visit: Payer: Self-pay | Admitting: Hematology and Oncology

## 2020-10-07 DIAGNOSIS — Z170 Estrogen receptor positive status [ER+]: Secondary | ICD-10-CM

## 2020-10-07 DIAGNOSIS — C50211 Malignant neoplasm of upper-inner quadrant of right female breast: Secondary | ICD-10-CM

## 2020-10-09 DEATH — deceased

## 2020-10-13 NOTE — Assessment & Plan Note (Signed)
Patient has had longstanding anemia where the hemoglobin was between 10 to 12 g. Since April 2020 her hemoglobin has gotten worse During the same timeframe her creatinine got from 1.5-2.44   Current treatment:Retacritinjectionsweeklystarted7/31/2020; changed to Aranesp 515m q3 weeks 11/12/2019 Bone marrow biopsy 04/08/2019: Normocellular bone marrow with trilineage hematopoiesis, absent iron stores, flow cytometry no abnormal B or T-cell clonal abnormalities noted.   IV iron therapy:Venofer: November 2020, July 2021 Retacrit: Last injection9/17/2021 Lab review: 01/22/2020: Hemoglobin 10.3 (previously transfuse for hemoglobin of 7.5) 02/04/2020:Hemoglobin 11.4(did not receive Aranesp injection) 03/14/2020: Hemoglobin 10.5, platelets 108 (did not receive Aranesp injection) 03/25/20:Hemoglobin 10.6 (did not receive Aranesp) 05/13/2020: Hemoglobin 8.9: Receiving Aranesp today 06/17/2020: Hemoglobin 9.9: Receiving Aranesp 07/22/2020: Hemoglobin 10.4: Received Aranesp 08/19/20: Hb 9.5 : received Aranesp 09/16/20: Hb 9.7: Aranesp  She will receive Aranesp every 4 weeks with labs. I will see her in 8 weeks.

## 2020-10-13 NOTE — Progress Notes (Signed)
Patient Care Team: [name], DO as PCP - General [name], NP as PCP - Cardiology (Nurse Practitioner) Evans Lance, MD as PCP - Electrophysiology (Cardiology) Alphonsa Overall, MD as Consulting Physician (General Surgery) Nicholas Lose, MD as Consulting Physician (Hematology and Oncology) Gery Pray, MD as Consulting Physician (Radiation Oncology) Evans Lance, MD as Consulting Physician (Cardiology) Collene Gobble, MD as Consulting Physician (Pulmonary Disease) Elayne Snare, MD as Consulting Physician (Endocrinology) Delice Bison, Charlestine Massed, NP as Nurse Practitioner (Hematology and Oncology) Ardelle Balls., MD (Neurology) Izora Gala, MD as Consulting Physician (Otolaryngology) Trula Slade, DPM as Consulting Physician (Podiatry) Cameron Sprang, MD as Consulting Physician (Neurology)  DIAGNOSIS:    ICD-10-CM   1. Anemia of chronic disease  D63.8   2. Iron deficiency anemia, unspecified iron deficiency anemia type  D50.9     SUMMARY OF ONCOLOGIC HISTORY: Oncology History  Breast cancer of upper-inner quadrant of right female breast ([place])  12/14/2015 Initial Diagnosis   Right breast biopsy 12:30 position: 2 masses, 1.6 cm mass: Invasive ductal carcinoma, grade 2, ER 0%, PR 0%, HER-2 negative, Ki-67 70%; satellite mass 8 mm: IDC grade 2 ER 5%, PR 5%, HER-2 negative, Ki-67 50%; T1cN0 stage IA clinical stage   01/18/2016 Surgery   Right lumpectomy: Multifocal IDC grade 3, 1.9 cm  ER 0%, PR 0%, HER-2 negative, Ki-67 70% and 0.8 cm satellite mass ER 5%, PR 2%, HER-2 negative, Ki-67 50%, high-grade DCIS, margins negative, 0/1 lymph nodes negative T1 cN0 stage IA   02/17/2016 - 04/06/2016 Chemotherapy   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 with above-mentioned history of breast cancer and severe anemia.Mammogram and [place] on 10/07/20 showed a stable, probably benign right breast mass at the 10 o'clock position, 2.9cm. She presents to the clinic todayfor follow-up. Continued symptoms of severe fatigue.  We requested wheelchair.  ALLERGIES:  is allergic to amoxicillin, tape, aldactone [spironolactone], arimidex [anastrozole], latex, and tetracycline.  MEDICATIONS:  Current Outpatient Medications  Medication Sig Dispense Refill  . Accu-Chek FastClix Lancets MISC Use to check blood sugar 3 times per day 100 each 5  . ACCU-CHEK GUIDE test strip Use to test blood sugar 3 times daily 150 strip 3  . acetaminophen (TYLENOL) 500 MG tablet Take 1,000 mg by mouth as needed for moderate pain.    Marland Kitchen albuterol (PROVENTIL) (2.5 MG/3ML) 0.083% nebulizer solution Take 3 mLs (2.5 mg total) by nebulization every 6 (six) hours as needed for wheezing or shortness of breath. 75 mL 3  . albuterol (VENTOLIN HFA) 108 (90 Base) MCG/ACT inhaler Inhale 2 puffs into the lungs every 6 (six) hours as needed for wheezing or shortness of breath. 18 g 5  . amiodarone (PACERONE) 200 MG tablet TAKE ONE TABLET BY MOUTH DAILY (Patient taking differently: Take 200 mg by mouth daily.) 90 tablet 3  . Blood Glucose Monitoring Suppl (ACCU-CHEK  GUIDE ME) w/Device KIT Use accu chek meter to check blood sugar three times daily. 1 kit 0  . cetirizine (ZYRTEC) 10 MG tablet Take 10 mg by mouth daily.    . Cholecalciferol (DIALYVITE VITAMIN D 5000) 125 MCG (5000 UT) capsule Take 5,000 Units by mouth daily.    Marland Kitchen ELIQUIS 2.5  MG TABS tablet Take 1 tablet (2.5 mg total) by mouth 2 (two) times daily. (Patient taking differently: Take 2.5 mg by mouth 2 (two) times daily.) 180 tablet 1  . exemestane (AROMASIN) 25 MG tablet TAKE ONE TABLET BY MOUTH DAILY AFTER BREAKFAST 90 tablet 3  . ezetimibe (ZETIA) 10 MG tablet TAKE ONE TABLET BY MOUTH EVERY DAY (Patient taking differently: Take 10 mg by mouth daily.) 90 tablet 3  . famotidine (PEPCID) 20 MG tablet TAKE ONE TABLET BY MOUTH TWICE DAILY. ONE AFTER SUPPER 60 tablet 11  . furosemide (LASIX) 20 MG tablet Take 20 mg by mouth daily as needed for fluid. (Patient not taking: Reported on 08/23/2020)    . HUMALOG KWIKPEN 100 UNIT/ML KwikPen INJECT SUBCUTANEOUSLY 4 TO  6 UNITS 3 TIMES DAILY  BEFORE MEALS (Patient taking differently: Inject 6-8 Units into the skin 3 (three) times daily.) 30 mL 3  . insulin glargine (LANTUS) 100 UNIT/ML injection Inject 20 Units into the skin daily.     . Insulin Pen Needle (BD PEN NEEDLE NANO U/F) 32G X 4 MM MISC USE AS INSTRUCTED TO INJECT INSULIN 4 TIMES DAILY. 360 each 1  . isosorbide mononitrate (IMDUR) 30 MG 24 hr tablet Take 1 tablet (30 mg total) by mouth daily. 90 tablet 3  . LANTUS SOLOSTAR 100 UNIT/ML Solostar Pen INJECT SUBCUTANEOUSLY 16  UNITS DAILY IN THE MORNING 15 mL 3  . metoprolol succinate (TOPROL-XL) 25 MG 24 hr tablet Take 0.5 tablets (12.5 mg total) by mouth daily. 45 tablet 3  . midodrine (PROAMATINE) 5 MG tablet Take 1 tablet (5 mg total) by mouth 3 (three) times daily with meals. 270 tablet 3  . nitroGLYCERIN (NITROSTAT) 0.4 MG SL tablet Place 1 tablet (0.4 mg total) under the tongue every 5 (five) minutes as needed for chest pain. 25 tablet 3  . ondansetron (ZOFRAN ODT) 4 MG disintegrating tablet Take 1 tablet (4 mg total) by mouth every 8 (eight) hours as needed. (Patient not taking: Reported on 08/23/2020) 10 tablet 0  . rosuvastatin (CRESTOR) 40 MG tablet Take 1 tablet (40 mg total) by mouth at bedtime. 90 tablet 3  . SYMBICORT  160-4.5 MCG/ACT inhaler INHALE TWO PUFFS INTO THE LUNGS TWICE DAILY (Patient taking differently: Inhale 2 puffs into the lungs in the morning and at bedtime.) 10.2 g 1  . TRULICITY 1.5 CW/2.3JS SOPN INJECT THE CONTENTS OF ONE  PEN SUBCUTANEOUSLY WEEKLY  AS DIRECTED (Patient taking differently: Inject 1.5 mg into the skin every 7 (seven) days.) 6 mL 3  . vitamin B-12 (CYANOCOBALAMIN) 100 MCG tablet Take 100 mcg by mouth daily.    Marland Kitchen zolpidem (AMBIEN) 5 MG tablet Take 7.5 mg by mouth at bedtime as needed for sleep.     No current facility-administered medications for this visit.    PHYSICAL EXAMINATION: ECOG PERFORMANCE STATUS: 1 - Symptomatic but completely ambulatory  Vitals:   10/14/20 1111  BP: (!) 105/31  Pulse: 73  Resp: 16  Temp: (!) 97.5 F (36.4 C)  SpO2: 100%   Filed Weights   10/14/20 1111  Weight: 137 lb 14.4 oz (62.6 kg)    BREAST: No  palpable masses or nodules in either right or left breasts. No palpable axillary supraclavicular or infraclavicular adenopathy no breast tenderness or nipple discharge. (exam performed in the presence of a chaperone)  LABORATORY DATA:  I have reviewed the data as listed CMP Latest Ref Rng & Units 09/16/2020 08/19/2020 08/02/2020  Glucose 70 - 99 mg/dL 149(H) 381(H) 197(H)  BUN 8 - 23 mg/dL 39(H) 36(H) 33(H)  Creatinine 0.44 - 1.00 mg/dL 2.48(H) 2.57(H) 2.89(H)  Sodium 135 - 145 mmol/L 142 137 140  Potassium 3.5 - 5.1 mmol/L 4.9 4.6 5.0  Chloride 98 - 111 mmol/L 106 106 106  CO2 22 - 32 mmol/L _0 Calcium 8.9 - 10.3 mg/dL 9.0 8.7(L) 9.2  Total Protein 6.5 - 8.1 g/dL 6.4(L) 6.0(L) 6.1(L)  Total Bilirubin 0.3 - 1.2 mg/dL 0.6 0.5 0.6  Alkaline Phos 38 - 126 U/L 58 70 93  AST 15 - 41 U/L 29 30 32  ALT 0 - 44 U/L 26 24 35    Lab Results  Component Value Date   WBC 4.8 10/14/2020   HGB 9.3 (L) 10/14/2020   HCT 32.1 (L) 10/14/2020   MCV 94.1 10/14/2020   PLT 154 10/14/2020   NEUTROABS 3.6 10/14/2020    ASSESSMENT & PLAN:   Anemia of chronic disease Patient has had longstanding anemia where the hemoglobin was between 10 to 12 g. Since April 2020 her hemoglobin has gotten worse During the same timeframe her creatinine got from 1.5-2.44   Current treatment:Retacritinjectionsweeklystarted7/31/2020; changed to Aranesp 553m q3 weeks 11/12/2019 Bone marrow biopsy 04/08/2019: Normocellular bone marrow with trilineage hematopoiesis, absent iron stores, flow cytometry no abnormal B or T-cell clonal abnormalities noted.   IV iron therapy:Venofer: November 2020, July 2021 Retacrit: Last injection9/17/2021 Lab review: 01/22/2020: Hemoglobin 10.3 (previously transfuse for hemoglobin of 7.5) 02/04/2020:Hemoglobin 11.4(did not receive Aranesp injection) 03/14/2020: Hemoglobin 10.5, platelets 108 (did not receive Aranesp injection) 03/25/20:Hemoglobin 10.6 (did not receive Aranesp) 05/13/2020: Hemoglobin 8.9: Receiving Aranesp today 06/17/2020: Hemoglobin 9.9: Receiving Aranesp 07/22/2020: Hemoglobin 10.4: Received Aranesp 08/19/20: Hb 9.5 : received Aranesp 09/16/20: Hb 9.7: Aranesp  She will receive Aranesp every 4 weeks with labs. I will see her in 8 weeks.  Iron deficiency anemia Patient has had longstanding anemia where the hemoglobin was between 10 to 12 g. Since April 2020 her hemoglobin has gotten worse During the same timeframe her creatinine got from 1.5-2.44   Current treatment:Retacritinjectionsweeklystarted7/31/2020; changed to Aranesp [id number] weeks 11/12/2019 Bone marrow biopsy 04/08/2019: Normocellular bone marrow with trilineage hematopoiesis, absent iron stores, flow cytometry no abnormal B or T-cell clonal abnormalities noted.   IV iron therapy:Venofer: November 2020, July 2021 Retacrit: Last injection9/17/2021 Lab review: 01/22/2020: Hemoglobin 10.3 (previously transfuse for hemoglobin of 7.5) 02/04/2020:Hemoglobin 11.4(did not receive Aranesp injection) 03/14/2020: Hemoglobin 10.5,  platelets 108 (did not receive Aranesp injection) 03/25/20:Hemoglobin 10.6 (did not receive Aranesp) 05/13/2020: Hemoglobin 8.9: Receiving Aranesp today 06/17/2020: Hemoglobin 9.9: Receiving Aranesp 07/22/2020: Hemoglobin 10.4: Received Aranesp 08/19/20: Hb 9.5 : received Aranesp 09/16/20: Hb 9.7 10/14/20: Hb 9.3  Severe fatigue: I discussed with her is multifactorial.  I offered her home physical therapy but she does not want to do it at this time.  Unfortunately there is no other options.  She is currently taking B12 supplement.  She will receive Aranesp every 4 weeks with labs. I will see her in 12 weeks.     No orders of the defined types were placed in this encounter.  The patient has a good understanding  of the overall plan. she agrees with it. she will call with any problems that may develop before the next visit here.  Total time spent: 20 mins including face to face time and time spent for planning, charting and coordination of care  Rulon Eisenmenger, MD, MPH 10/14/2020  I, Cloyde Reams Dorshimer, am acting as scribe for Dr. Nicholas Lose.  I have reviewed the above documentation for accuracy and completeness, and I agree with the above.

## 2020-10-13 NOTE — Assessment & Plan Note (Signed)
Patient has had longstanding anemia where the hemoglobin was between 10 to 12 g. Since April 2020 her hemoglobin has gotten worse During the same timeframe her creatinine got from 1.5-2.44   Current treatment:Retacritinjectionsweeklystarted7/31/2020; changed to Aranesp 541m q3 weeks 11/12/2019 Bone marrow biopsy 04/08/2019: Normocellular bone marrow with trilineage hematopoiesis, absent iron stores, flow cytometry no abnormal B or T-cell clonal abnormalities noted.   IV iron therapy:Venofer: November 2020, July 2021 Retacrit: Last injection9/17/2021 Lab review: 01/22/2020: Hemoglobin 10.3 (previously transfuse for hemoglobin of 7.5) 02/04/2020:Hemoglobin 11.4(did not receive Aranesp injection) 03/14/2020: Hemoglobin 10.5, platelets 108 (did not receive Aranesp injection) 03/25/20:Hemoglobin 10.6 (did not receive Aranesp) 05/13/2020: Hemoglobin 8.9: Receiving Aranesp today 06/17/2020: Hemoglobin 9.9: Receiving Aranesp 07/22/2020: Hemoglobin 10.4: Received Aranesp 08/19/20: Hb 9.5 : received Aranesp 09/16/20: Hb 9.7  She will receive Aranesp every 4 weeks with labs. I will see her in 8 weeks.

## 2020-10-14 ENCOUNTER — Ambulatory Visit: Payer: Medicare Other

## 2020-10-14 ENCOUNTER — Other Ambulatory Visit: Payer: Medicare Other

## 2020-10-14 ENCOUNTER — Other Ambulatory Visit: Payer: Self-pay

## 2020-10-14 ENCOUNTER — Inpatient Hospital Stay (HOSPITAL_BASED_OUTPATIENT_CLINIC_OR_DEPARTMENT_OTHER): Payer: Medicare Other | Admitting: Hematology and Oncology

## 2020-10-14 ENCOUNTER — Inpatient Hospital Stay: Payer: Medicare Other

## 2020-10-14 ENCOUNTER — Inpatient Hospital Stay: Payer: Medicare Other | Attending: Adult Health

## 2020-10-14 DIAGNOSIS — Z7901 Long term (current) use of anticoagulants: Secondary | ICD-10-CM | POA: Insufficient documentation

## 2020-10-14 DIAGNOSIS — C50211 Malignant neoplasm of upper-inner quadrant of right female breast: Secondary | ICD-10-CM | POA: Diagnosis not present

## 2020-10-14 DIAGNOSIS — Z9221 Personal history of antineoplastic chemotherapy: Secondary | ICD-10-CM | POA: Diagnosis not present

## 2020-10-14 DIAGNOSIS — R5383 Other fatigue: Secondary | ICD-10-CM | POA: Diagnosis not present

## 2020-10-14 DIAGNOSIS — D509 Iron deficiency anemia, unspecified: Secondary | ICD-10-CM | POA: Insufficient documentation

## 2020-10-14 DIAGNOSIS — Z79899 Other long term (current) drug therapy: Secondary | ICD-10-CM | POA: Diagnosis not present

## 2020-10-14 DIAGNOSIS — Z923 Personal history of irradiation: Secondary | ICD-10-CM | POA: Insufficient documentation

## 2020-10-14 DIAGNOSIS — I129 Hypertensive chronic kidney disease with stage 1 through stage 4 chronic kidney disease, or unspecified chronic kidney disease: Secondary | ICD-10-CM | POA: Insufficient documentation

## 2020-10-14 DIAGNOSIS — Z170 Estrogen receptor positive status [ER+]: Secondary | ICD-10-CM | POA: Diagnosis not present

## 2020-10-14 DIAGNOSIS — Z794 Long term (current) use of insulin: Secondary | ICD-10-CM | POA: Diagnosis not present

## 2020-10-14 DIAGNOSIS — D638 Anemia in other chronic diseases classified elsewhere: Secondary | ICD-10-CM

## 2020-10-14 DIAGNOSIS — D631 Anemia in chronic kidney disease: Secondary | ICD-10-CM | POA: Diagnosis not present

## 2020-10-14 DIAGNOSIS — Z79811 Long term (current) use of aromatase inhibitors: Secondary | ICD-10-CM | POA: Diagnosis not present

## 2020-10-14 DIAGNOSIS — N189 Chronic kidney disease, unspecified: Secondary | ICD-10-CM | POA: Diagnosis not present

## 2020-10-14 LAB — CBC WITH DIFFERENTIAL (CANCER CENTER ONLY)
Abs Immature Granulocytes: 0.01 10*3/uL (ref 0.00–0.07)
Basophils Absolute: 0.1 10*3/uL (ref 0.0–0.1)
Basophils Relative: 1 %
Eosinophils Absolute: 0.3 10*3/uL (ref 0.0–0.5)
Eosinophils Relative: 7 %
HCT: 32.1 % — ABNORMAL LOW (ref 36.0–46.0)
Hemoglobin: 9.3 g/dL — ABNORMAL LOW (ref 12.0–15.0)
Immature Granulocytes: 0 %
Lymphocytes Relative: 11 %
Lymphs Abs: 0.5 10*3/uL — ABNORMAL LOW (ref 0.7–4.0)
MCH: 27.3 pg (ref 26.0–34.0)
MCHC: 29 g/dL — ABNORMAL LOW (ref 30.0–36.0)
MCV: 94.1 fL (ref 80.0–100.0)
Monocytes Absolute: 0.3 10*3/uL (ref 0.1–1.0)
Monocytes Relative: 5 %
Neutro Abs: 3.6 10*3/uL (ref 1.7–7.7)
Neutrophils Relative %: 76 %
Platelet Count: 154 10*3/uL (ref 150–400)
RBC: 3.41 MIL/uL — ABNORMAL LOW (ref 3.87–5.11)
RDW: 17.2 % — ABNORMAL HIGH (ref 11.5–15.5)
WBC Count: 4.8 10*3/uL (ref 4.0–10.5)
nRBC: 0 % (ref 0.0–0.2)

## 2020-10-14 LAB — CMP (CANCER CENTER ONLY)
ALT: 23 U/L (ref 0–44)
AST: 29 U/L (ref 15–41)
Albumin: 2.9 g/dL — ABNORMAL LOW (ref 3.5–5.0)
Alkaline Phosphatase: 56 U/L (ref 38–126)
Anion gap: 7 (ref 5–15)
BUN: 37 mg/dL — ABNORMAL HIGH (ref 8–23)
CO2: 27 mmol/L (ref 22–32)
Calcium: 9.4 mg/dL (ref 8.9–10.3)
Chloride: 107 mmol/L (ref 98–111)
Creatinine: 2.29 mg/dL — ABNORMAL HIGH (ref 0.44–1.00)
GFR, Estimated: 22 mL/min — ABNORMAL LOW (ref >60)
Glucose, Bld: 147 mg/dL — ABNORMAL HIGH (ref 70–99)
Potassium: 4.3 mmol/L (ref 3.5–5.1)
Sodium: 141 mmol/L (ref 135–145)
Total Bilirubin: 0.6 mg/dL (ref 0.3–1.2)
Total Protein: 6.3 g/dL — ABNORMAL LOW (ref 6.5–8.1)

## 2020-10-14 LAB — IRON AND TIBC
Iron: 88 ug/dL (ref 41–142)
Saturation Ratios: 29 % (ref 21–57)
TIBC: 304 ug/dL (ref 236–444)
UIBC: 216 ug/dL (ref 120–384)

## 2020-10-14 LAB — RETIC PANEL
Immature Retic Fract: 8.6 % (ref 2.3–15.9)
RBC.: 3.34 MIL/uL — ABNORMAL LOW (ref 3.87–5.11)
Retic Count, Absolute: 40.1 10*3/uL (ref 19.0–186.0)
Retic Ct Pct: 1.2 % (ref 0.4–3.1)
Reticulocyte Hemoglobin: 33.3 pg (ref >27.9)

## 2020-10-14 LAB — FERRITIN: Ferritin: 40 ng/mL (ref 11–307)

## 2020-10-14 LAB — SAMPLE TO BLOOD BANK

## 2020-10-14 IMAGING — DX CHEST - 2 VIEW
2 series · 2 of 2 positions shown · non-contrast
Comparison: 01/19/2019

CLINICAL DATA: Follow-up right pleural effusion

EXAM:
CHEST - 2 VIEW

[chest pa]
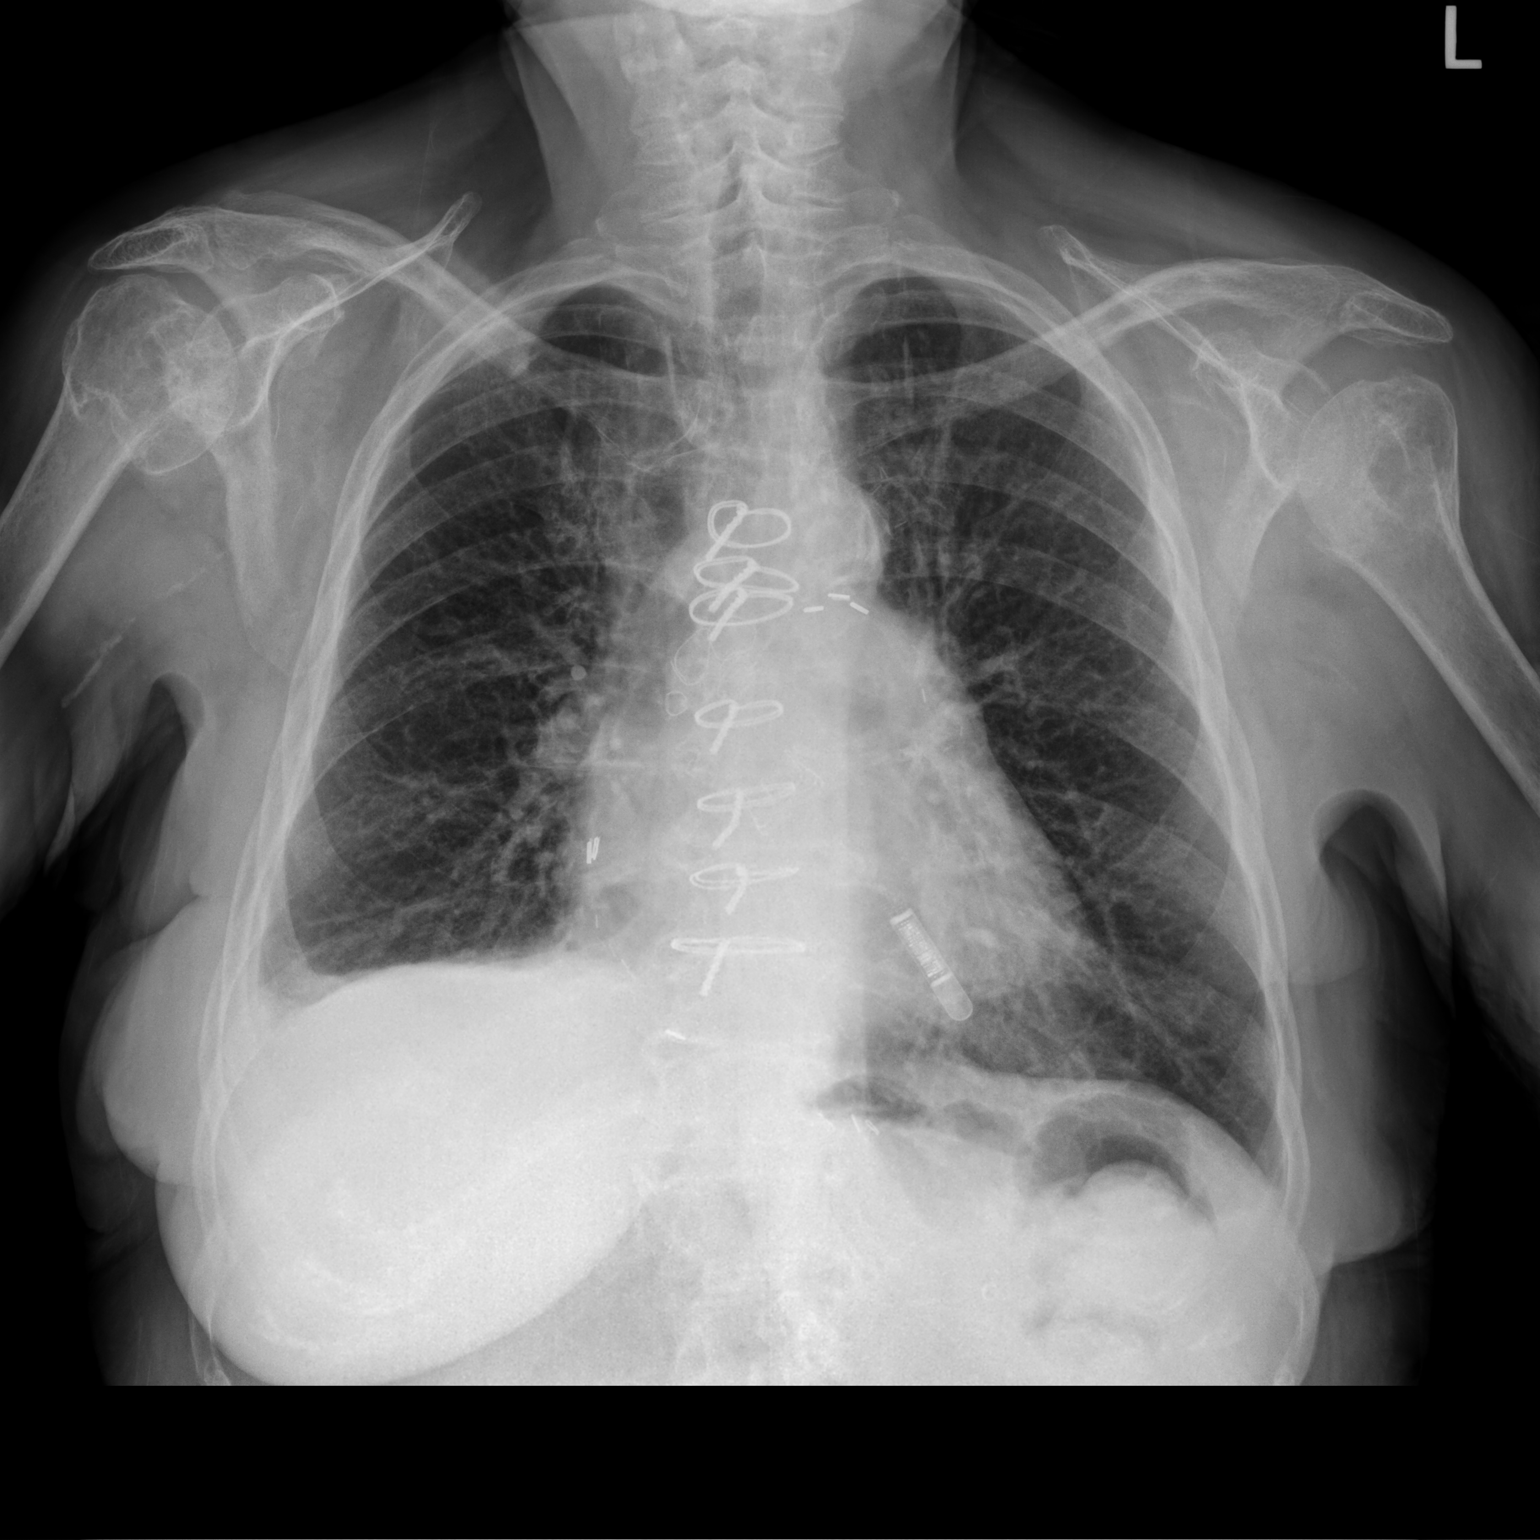

[chest lat]
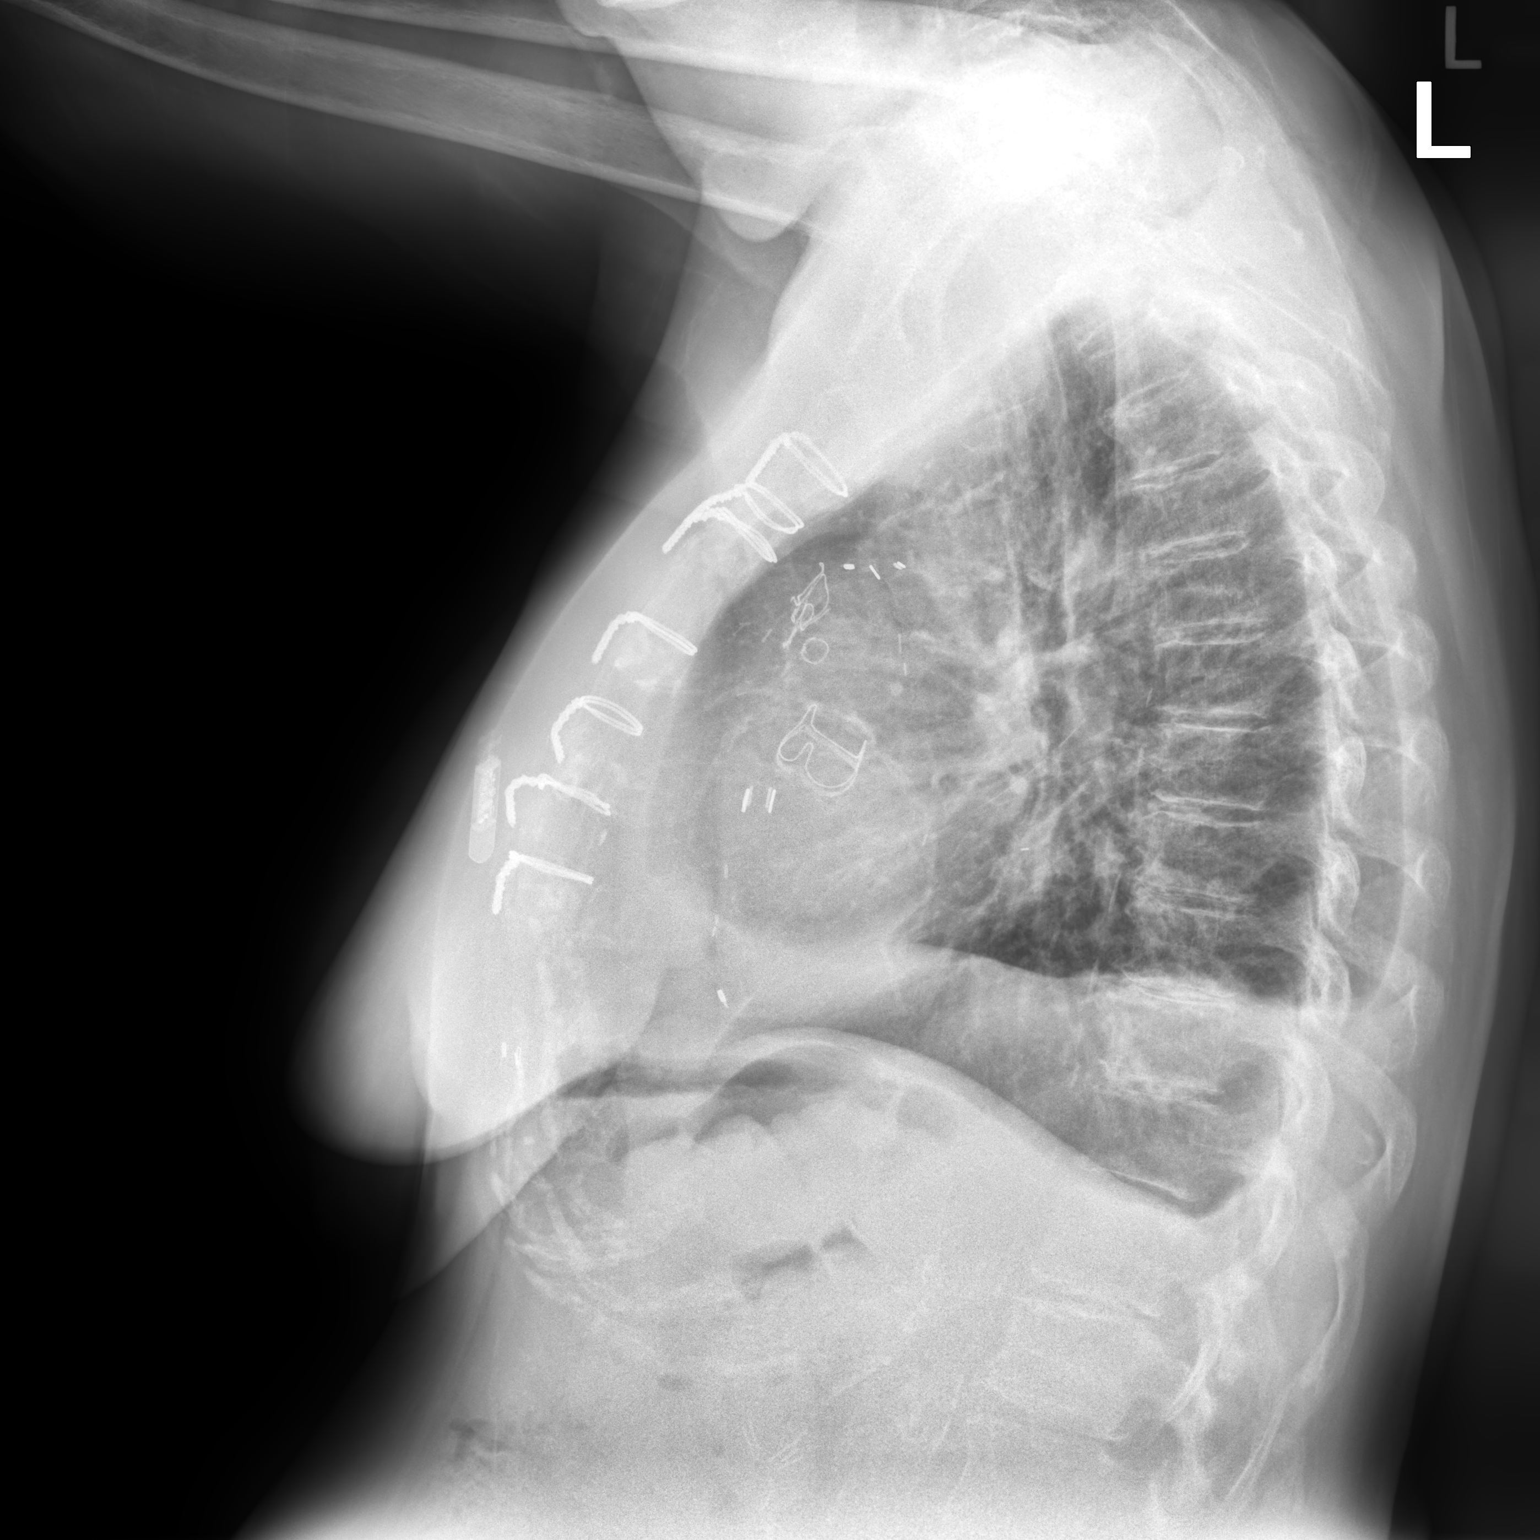

[2 of 2 positions shown; findings below may reference images not displayed]

FINDINGS: prior CABG. Heart is normal size. Small right pleural effusion again
noted, unchanged. No confluent opacities. No acute bony abnormality.
IMPRESSION: Stable small right pleural effusion.

## 2020-10-14 MED ORDER — DARBEPOETIN ALFA 500 MCG/ML IJ SOSY
PREFILLED_SYRINGE | INTRAMUSCULAR | Status: AC
  Filled 2020-10-14: qty 1

## 2020-10-14 MED ORDER — DARBEPOETIN ALFA 500 MCG/ML IJ SOSY
500.0000 ug | PREFILLED_SYRINGE | Freq: Once | INTRAMUSCULAR | Status: AC
  Administered 2020-10-14: 500 ug via SUBCUTANEOUS

## 2020-10-14 NOTE — Patient Instructions (Signed)
Darbepoetin Alfa injection What is this medicine? DARBEPOETIN ALFA (dar be POE e tin AL fa) helps your body make more red blood cells. It is used to treat anemia caused by chronic kidney failure and chemotherapy. This medicine may be used for other purposes; ask your health care provider or pharmacist if you have questions. COMMON BRAND NAME(S): Aranesp What should I tell my health care provider before I take this medicine? They need to know if you have any of these conditions:  blood clotting disorders or history of blood clots  cancer patient not on chemotherapy  cystic fibrosis  heart disease, such as angina, heart failure, or a history of a heart attack  hemoglobin level of 12 g/dL or greater  high blood pressure  low levels of folate, iron, or vitamin B12  seizures  an unusual or allergic reaction to darbepoetin, erythropoietin, albumin, hamster proteins, latex, other medicines, foods, dyes, or preservatives  pregnant or trying to get pregnant  breast-feeding How should I use this medicine? This medicine is for injection into a vein or under the skin. It is usually given by a health care professional in a hospital or clinic setting. If you get this medicine at home, you will be taught how to prepare and give this medicine. Use exactly as directed. Take your medicine at regular intervals. Do not take your medicine more often than directed. It is important that you put your used needles and syringes in a special sharps container. Do not put them in a trash can. If you do not have a sharps container, call your pharmacist or healthcare provider to get one. A special MedGuide will be given to you by the pharmacist with each prescription and refill. Be sure to read this information carefully each time. Talk to your pediatrician regarding the use of this medicine in children. While this medicine may be used in children as young as 1 month of age for selected conditions, precautions do  apply. Overdosage: If you think you have taken too much of this medicine contact a poison control center or emergency room at once. NOTE: This medicine is only for you. Do not share this medicine with others. What if I miss a dose? If you miss a dose, take it as soon as you can. If it is almost time for your next dose, take only that dose. Do not take double or extra doses. What may interact with this medicine? Do not take this medicine with any of the following medications:  epoetin alfa This list may not describe all possible interactions. Give your health care provider a list of all the medicines, herbs, non-prescription drugs, or dietary supplements you use. Also tell them if you smoke, drink alcohol, or use illegal drugs. Some items may interact with your medicine. What should I watch for while using this medicine? Your condition will be monitored carefully while you are receiving this medicine. You may need blood work done while you are taking this medicine. This medicine may cause a decrease in vitamin B6. You should make sure that you get enough vitamin B6 while you are taking this medicine. Discuss the foods you eat and the vitamins you take with your health care professional. What side effects may I notice from receiving this medicine? Side effects that you should report to your doctor or health care professional as soon as possible:  allergic reactions like skin rash, itching or hives, swelling of the face, lips, or tongue  breathing problems  changes in   vision  chest pain  confusion, trouble speaking or understanding  feeling faint or lightheaded, falls  high blood pressure  muscle aches or pains  pain, swelling, warmth in the leg  rapid weight gain  severe headaches  sudden numbness or weakness of the face, arm or leg  trouble walking, dizziness, loss of balance or coordination  seizures (convulsions)  swelling of the ankles, feet, hands  unusually weak or  tired Side effects that usually do not require medical attention (report to your doctor or health care professional if they continue or are bothersome):  diarrhea  fever, chills (flu-like symptoms)  headaches  nausea, vomiting  redness, stinging, or swelling at site where injected This list may not describe all possible side effects. Call your doctor for medical advice about side effects. You may report side effects to FDA at 1-800-FDA-1088. Where should I keep my medicine? Keep out of the reach of children. Store in a refrigerator between 2 and 8 degrees C (36 and 46 degrees F). Do not freeze. Do not shake. Throw away any unused portion if using a single-dose vial. Throw away any unused medicine after the expiration date. NOTE: This sheet is a summary. It may not cover all possible information. If you have questions about this medicine, talk to your doctor, pharmacist, or health care provider.  2021 Elsevier/Gold Standard (2017-06-12 16:44:20)  

## 2020-10-18 ENCOUNTER — Other Ambulatory Visit: Payer: Self-pay | Admitting: Emergency Medicine

## 2020-10-21 ENCOUNTER — Other Ambulatory Visit: Payer: Self-pay

## 2020-10-21 ENCOUNTER — Other Ambulatory Visit (INDEPENDENT_AMBULATORY_CARE_PROVIDER_SITE_OTHER): Payer: Medicare Other

## 2020-10-21 ENCOUNTER — Encounter: Payer: Medicare Other | Admitting: Endocrinology

## 2020-10-21 DIAGNOSIS — E1165 Type 2 diabetes mellitus with hyperglycemia: Secondary | ICD-10-CM | POA: Diagnosis not present

## 2020-10-21 DIAGNOSIS — Z794 Long term (current) use of insulin: Secondary | ICD-10-CM

## 2020-10-21 LAB — BASIC METABOLIC PANEL
BUN: 39 mg/dL — ABNORMAL HIGH (ref 6–23)
CO2: 26 mEq/L (ref 19–32)
Calcium: 9.6 mg/dL (ref 8.4–10.5)
Chloride: 106 mEq/L (ref 96–112)
Creatinine, Ser: 2.42 mg/dL — ABNORMAL HIGH (ref 0.40–1.20)
GFR: 19.14 mL/min — ABNORMAL LOW (ref >60.00)
Glucose, Bld: 151 mg/dL — ABNORMAL HIGH (ref 70–99)
Potassium: 4.5 mEq/L (ref 3.5–5.1)
Sodium: 139 mEq/L (ref 135–145)

## 2020-10-21 LAB — MICROALBUMIN / CREATININE URINE RATIO
Creatinine,U: 80.1 mg/dL
Microalb Creat Ratio: 21 mg/g (ref 0.0–30.0)
Microalb, Ur: 16.8 mg/dL — ABNORMAL HIGH (ref 0.0–1.9)

## 2020-10-21 LAB — HEMOGLOBIN A1C: Hgb A1c MFr Bld: 7.3 % — ABNORMAL HIGH (ref 4.6–6.5)

## 2020-10-23 NOTE — Progress Notes (Signed)
This encounter was created in error - please disregard.

## 2020-10-26 ENCOUNTER — Ambulatory Visit (INDEPENDENT_AMBULATORY_CARE_PROVIDER_SITE_OTHER): Payer: Medicare Other | Admitting: Endocrinology

## 2020-10-26 ENCOUNTER — Other Ambulatory Visit: Payer: Self-pay

## 2020-10-26 ENCOUNTER — Encounter: Payer: Self-pay | Admitting: Endocrinology

## 2020-10-26 VITALS — BP 120/72 | HR 77 | Ht 64.0 in | Wt 135.4 lb

## 2020-10-26 DIAGNOSIS — E1142 Type 2 diabetes mellitus with diabetic polyneuropathy: Secondary | ICD-10-CM | POA: Diagnosis not present

## 2020-10-26 DIAGNOSIS — N1832 Chronic kidney disease, stage 3b: Secondary | ICD-10-CM

## 2020-10-26 DIAGNOSIS — Z794 Long term (current) use of insulin: Secondary | ICD-10-CM

## 2020-10-26 DIAGNOSIS — E1165 Type 2 diabetes mellitus with hyperglycemia: Secondary | ICD-10-CM | POA: Diagnosis not present

## 2020-10-26 NOTE — Patient Instructions (Signed)
Breakfast dose should be 12-14 units, stay with 10 at other meals

## 2020-10-26 NOTE — Progress Notes (Signed)
Patient ID: Hannah [name], female   DOB: 1946/03/29, 75 y.o.   MRN: [id number]           Reason for Appointment: Follow-up for Type 2 Diabetes   History of Present Illness:          Date of diagnosis of type 2 diabetes mellitus: Late 1980s        Background history:  She was previously treated with metformin prior to starting insulin She thinks she has been on insulin for at least 10 years and mostly taking Lantus and other types of insulin with it Her metformin was stopped when she started insulin and also has had renal dysfunction Previously her A1c had been consistently over 8% and occasionally higher.  She had been on the V-go pump since 03/2015, this was stopped in 3/20  Recent history:   INSULIN regimen is described as:  Lantus 18 units in am, Humalog 8-10 units before meals  Non-insulin hypoglycemic drugs the patient is taking are: Trulicity 1.5 mg weekly   Current diabetes management, blood sugar patterns and problems identified:   Her A1c is usually lower than expected, now 7.3   Recent average blood sugar is 180  She did bring her monitor for download today  She appears to have significantly high blood sugars nonfasting and averaging over 200 all afternoon and evening  HIGHEST blood sugars are on an average between 4-6 PM but appear to be generally higher before lunch and in the afternoon  Blood sugars are fluctuating significantly in the afternoons and late evenings also  She was told to go up on her mealtime doses as carbohydrate counting does not appear to be helpful but even with taking as much as 10 units of Humalog in the morning most readings at lunchtime appear to be higher  This is despite trying to have some cheese for protein in the morning and she is avoiding cereal  May have had some occasional steroids with blood sugars occasionally over 400  Only rarely will have a low blood sugar in the late afternoon, recently related to having a GI virus  and having decreased appetite  FASTING blood sugars are mildly increased as before but recently relatively better  She did go up on her Lantus to 22 on her last visit  She took her last Trulicity dose on Monday  Dinner 6-7 PM usually  Side effects from medications have been: None  Compliance with the medical regimen: Fair  Glucose monitoring:  done about 2-3 times a day         Glucometer:  Accu-Chek [facility]  Blood Glucose readings by download:   PRE-MEAL Fasting Lunch Dinner Bedtime Overall  Glucose range:     56-482  Mean/median:  150  173  218  165 180   POST-MEAL PC Breakfast PC Lunch PC Dinner  Glucose range:     Mean/median:    192    Previously:  PRE-MEAL Fasting Lunch Dinner Bedtime Overall  Glucose range:  101-170   111-265    Mean/median:  152   275  222   POST-MEAL PC Breakfast PC Lunch PC Dinner  Glucose range:    143-353  Mean/median:    255     Self-care: The diet that the patient has been following is: tries to limit fats and carbs.  She uses diet green tea or diet drinks and no sugar in drinks    Lunch 1-2 pm Dinner 6-7 PM  Typical meal intake: Breakfast  is oatrmeal or cheese toast   if she has any            Dietician visit, most recent: 5/16 And in 7/16 with oncology dietitian    Weight history:  Wt Readings from Last 3 Encounters:  10/26/20 135 lb 6.4 oz (61.4 kg)  10/14/20 137 lb 14.4 oz (62.6 kg)  08/23/20 136 lb (61.7 kg)    Glycemic control:      Lab Results  Component Value Date   HGBA1C 7.3 (H) 10/21/2020   HGBA1C 7.1 (H) 04/04/2020   HGBA1C 6.1 (H) 08/13/2019   Lab Results  Component Value Date   MICROALBUR 16.8 (H) 10/21/2020   LDLCALC 58 02/23/2020   CREATININE 2.42 (H) 10/21/2020   Lab Results  Component Value Date   FRUCTOSAMINE 241 12/11/2019   FRUCTOSAMINE 276 07/02/2019   FRUCTOSAMINE 276 10/11/2017        Allergies as of 10/26/2020      Reactions   Amoxicillin Rash, Other (See Comments)   Tolerates  Cephalosporins Has patient had a PCN reaction causing immediate rash, facial/tongue/throat swelling, SOB or lightheadedness with hypotension: Yes Has patient had a PCN reaction causing severe rash involving mucus membranes or skin necrosis: Yes Has patient had a PCN reaction that required hospitalization No Has patient had a PCN reaction occurring within the last 10 years: No If all of the above answers are "NO", then may proceed with Cephalosporin use.   Tape Other (See Comments)   Pulls off skin, must use paper tape   Aldactone [spironolactone] Other (See Comments)   CKD/hypokalemia   Arimidex [anastrozole] Nausea Only   Latex Itching, Other (See Comments)   (Dentist office)   Tetracycline Rash      Medication List       Accurate as of Oct 26, 2020  3:37 PM. If you have any questions, ask your nurse or doctor.        Accu-Chek FastClix Lancets Misc Use to check blood sugar 3 times per day   Accu-Chek Guide Me w/Device Kit Use accu chek meter to check blood sugar three times daily.   Accu-Chek Guide test strip Generic drug: glucose blood Use to test blood sugar 3 times daily   acetaminophen 500 MG tablet Commonly known as: TYLENOL Take 1,000 mg by mouth as needed for moderate pain.   albuterol (2.5 MG/3ML) 0.083% nebulizer solution Commonly known as: PROVENTIL Take 3 mLs (2.5 mg total) by nebulization every 6 (six) hours as needed for wheezing or shortness of breath.   albuterol 108 (90 Base) MCG/ACT inhaler Commonly known as: VENTOLIN HFA Inhale 2 puffs into the lungs every 6 (six) hours as needed for wheezing or shortness of breath.   amiodarone 200 MG tablet Commonly known as: PACERONE TAKE ONE TABLET BY MOUTH DAILY   BD Pen Needle Nano U/F 32G X 4 MM Misc Generic drug: Insulin Pen Needle USE AS INSTRUCTED TO INJECT INSULIN 4 TIMES DAILY.   cetirizine 10 MG tablet Commonly known as: ZYRTEC Take 10 mg by mouth daily.   Dialyvite Vitamin D 5000 125 MCG (5000  UT) capsule Generic drug: Cholecalciferol Take 5,000 Units by mouth daily.   Eliquis 2.5 MG Tabs tablet Generic drug: apixaban Take 1 tablet (2.5 mg total) by mouth 2 (two) times daily. What changed: See the new instructions.   exemestane 25 MG tablet Commonly known as: AROMASIN TAKE ONE TABLET BY MOUTH DAILY AFTER BREAKFAST   ezetimibe 10 MG tablet Commonly known as: ZETIA  TAKE ONE TABLET BY MOUTH EVERY DAY   famotidine 20 MG tablet Commonly known as: PEPCID TAKE ONE TABLET BY MOUTH TWICE DAILY. ONE AFTER SUPPER   furosemide 20 MG tablet Commonly known as: LASIX Take 20 mg by mouth daily as needed for fluid.   HumaLOG KwikPen 100 UNIT/ML KwikPen Generic drug: insulin lispro INJECT SUBCUTANEOUSLY 4 TO  6 UNITS 3 TIMES DAILY  BEFORE MEALS What changed: See the new instructions.   insulin glargine 100 UNIT/ML injection Commonly known as: LANTUS Inject 20 Units into the skin daily.   Lantus SoloStar 100 UNIT/ML Solostar Pen Generic drug: insulin glargine INJECT SUBCUTANEOUSLY 16  UNITS DAILY IN THE MORNING   isosorbide mononitrate 30 MG 24 hr tablet Commonly known as: IMDUR Take 1 tablet (30 mg total) by mouth daily.   metoprolol succinate 25 MG 24 hr tablet Commonly known as: TOPROL-XL Take 0.5 tablets (12.5 mg total) by mouth daily.   midodrine 5 MG tablet Commonly known as: PROAMATINE Take 1 tablet (5 mg total) by mouth 3 (three) times daily with meals.   nitroGLYCERIN 0.4 MG SL tablet Commonly known as: NITROSTAT Place 1 tablet (0.4 mg total) under the tongue every 5 (five) minutes as needed for chest pain.   ondansetron 4 MG disintegrating tablet Commonly known as: Zofran ODT Take 1 tablet (4 mg total) by mouth every 8 (eight) hours as needed.   rosuvastatin 40 MG tablet Commonly known as: CRESTOR Take 1 tablet (40 mg total) by mouth at bedtime.   Symbicort 160-4.5 MCG/ACT inhaler Generic drug: budesonide-formoterol INHALE TWO PUFFS INTO THE LUNGS  TWICE DAILY What changed: See the new instructions.   Trulicity 1.5 BS/4.9QP Sopn Generic drug: Dulaglutide INJECT THE CONTENTS OF ONE  PEN SUBCUTANEOUSLY WEEKLY  AS DIRECTED What changed: See the new instructions.   vitamin B-12 100 MCG tablet Commonly known as: CYANOCOBALAMIN Take 100 mcg by mouth daily.   zolpidem 5 MG tablet Commonly known as: AMBIEN Take 7.5 mg by mouth at bedtime as needed for sleep.       Allergies:  Allergies  Allergen Reactions  . Amoxicillin Rash and Other (See Comments)    Tolerates Cephalosporins Has patient had a PCN reaction causing immediate rash, facial/tongue/throat swelling, SOB or lightheadedness with hypotension: Yes Has patient had a PCN reaction causing severe rash involving mucus membranes or skin necrosis: Yes Has patient had a PCN reaction that required hospitalization No Has patient had a PCN reaction occurring within the last 10 years: No If all of the above answers are "NO", then may proceed with Cephalosporin use.   . Tape Other (See Comments)    Pulls off skin, must use paper tape  . Aldactone [Spironolactone] Other (See Comments)    CKD/hypokalemia  . Arimidex [Anastrozole] Nausea Only  . Latex Itching and Other (See Comments)    (Dentist office)  . Tetracycline Rash    Past Medical History:  Diagnosis Date  . Abnormally small mouth   . Allergic rhinitis 10/14/2009   Qualifier: Diagnosis of  By: Lamonte Sakai MD, Rose Fillers   Overview:  Overview:  Qualifier: Diagnosis of  By: Lamonte Sakai MD, Rose Fillers  Last Assessment & Plan:  Please continue Xyzal and Nasacort as you have been using them  . Anemia   . Asthma 05/12/2009   10/12/2014 p extensive coaching HFA effectiveness =    75% s spacer    Overview:  Overview:  10/12/2014 p extensive coaching HFA effectiveness =    75% s spacer  Last Assessment & Plan:  Please continue Symbicort 2 puffs twice a day. Remember to rinse and gargle after taking this medication. Take albuterol 2 puffs up to every 4  hours if needed for shortness of breath.  Follow with Dr Lamonte Sakai in 6 month  . Bilateral carotid artery stenosis    a. mild 1-39% by duplex 10/2017, due 2021.  . Breast cancer ([place]) 01/18/2016   right breast  . CAD (coronary artery disease) 01/24/2011   a. s/p CABG 1998 with redo 2009.  Marland Kitchen Chemotherapy induced nausea and vomiting 02/24/2016  . Chemotherapy-induced peripheral neuropathy ([place]) 04/27/2016  . 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 Chronic diastolic CHF (congestive heart failure) ([place]) 09/24/2013  . CKD (chronic kidney disease), stage IV ([place]) 09/24/2013  . Closed fracture of head of left humerus 09/09/2017  . Complication of anesthesia   . Dysrhythmia    a-fib  . Gallstones   . GERD (gastroesophageal reflux disease)   . Glaucoma   . H/O atrial tachycardia 05/12/2009   Qualifier: History of  By: Lamonte Sakai MD, Rose Fillers   Overview:  Overview:  Qualifier: History of  By: Lamonte Sakai MD, Rose Fillers  Last Assessment & Plan:  There was no evidence of this on her cardiac monitor. I would recommend watchful waiting.   Marland Kitchen Heart murmur   . History of kidney stones   . History of non-ST elevation myocardial infarction (NSTEMI)    Sept 2014--  thought to be type II HTN w/ LHC without infarct related artery and patent grafts  . History of radiation therapy 05/24/16-07/26/16   right breast 50.4 Gy in 28 fractions, right breast boost 10 Gy in 5 fractions  . Hyperlipidemia   . Hypotension   . Iron deficiency anemia   . Mania ([place]) 03/05/2016  . Moderate persistent asthma    pulmologist-  Dr. Malvin Johns  . Osteomyelitis of toe of left foot ([place]) 04/06/2016  . Osteopenia of multiple sites 10/19/2015  . PAF (paroxysmal atrial fibrillation) ([place])   . Personal history of chemotherapy 2017  . Personal history of radiation therapy 2017  . PONV (postoperative nausea and vomiting)   . Port catheter in place 02/17/2016  . Psoriasis    right leg  . Renal calculus, right   . S/P AVR    prosthesis valve  placement 2009 at same time re-do CABG  . Seizure-like activity ([place]) 09/01/2017  . Sensorineural hearing loss (SNHL) of both ears 01/06/2016  . Stroke [facility]) 2014   residual rt hearing loss  . Syncope 09/06/2017  . Type 2 diabetes mellitus ([place])    monitored by dr Dwyane Dee    Past Surgical History:  Procedure Laterality Date  . AORTIC VALVE REPLACEMENT  [facility]   [facility] is [facility]  . BREAST BIOPSY Right 02/12/2019   2 lymphnodes, 1 breast  . BREAST LUMPECTOMY Right 01/18/2016  . BREAST LUMPECTOMY WITH RADIOACTIVE SEED AND SENTINEL LYMPH NODE BIOPSY Right 01/18/2016   Procedure: RIGHT BREAST LUMPECTOMY WITH RADIOACTIVE SEED AND SENTINEL LYMPH NODE BIOPSY;  Surgeon: DAlphonsa Overall MD;  Location: [facility]  Service: General;  Laterality: Right;  . CARDIAC CATHETERIZATION  03/23/2008   Pre-redo CABG: L main OK, LAD (T), CFX (T), OM1 99%, RCA (T), LIMA-LAD OK, SVG-OM(?3) OK w/ little florw to OM2, SVG-RCA OK. EF NL  . CARPAL TUNNEL RELEASE    . CHEST TUBE INSERTION Right 06/26/2019   Procedure: INSERTION PLEURAL DRAINAGE CATHETER;  Surgeon: BGaye Pollack  MD;  Location: [place];  Service: Thoracic;  Laterality: Right;  . CHOLECYSTECTOMY N/A 07/07/2018   Procedure: LAPAROSCOPIC CHOLECYSTECTOMY WITH INTRAOPERATIVE CHOLANGIOGRAM ERAS PATHWAY;  Surgeon: Alphonsa Overall, MD;  Location: [facility];  Service: General;  Laterality: N/A;  . COLONOSCOPY     around 2015. Possibly with [facility]   . CORONARY ARTERY BYPASS GRAFT  1998 &  re-do 2009   Had LIMA to DX/LAD, SVG to 2 marginal branches and SVG to [facility] originally; SVG to 3rd OM and PD at time of redo  . CYSTOSCOPY W/ URETERAL STENT PLACEMENT Right 12/20/2014   Procedure: CYSTOSCOPY WITH RETROGRADE PYELOGRAM/URETERAL STENT PLACEMENT;  Surgeon: Cleon Gustin, MD;  Location: [facility];  Service: Urology;  Laterality: Right;  . CYSTOSCOPY WITH RETROGRADE PYELOGRAM, URETEROSCOPY AND STENT PLACEMENT Left  08/21/2019   Procedure: CYSTOSCOPY WITH RETROGRADE PYELOGRAM, URETEROSCOPY AND STENT PLACEMENT;  Surgeon: Cleon Gustin, MD;  Location: [facility];  Service: Urology;  Laterality: Left;  1 HR  . ESOPHAGOGASTRODUODENOSCOPY     many years ago per patient   . ESOPHAGOGASTRODUODENOSCOPY ENDOSCOPY  06/17/2018  . EYE SURGERY Bilateral    cataracts  . HOLMIUM LASER APPLICATION Right 09/18/7351   Procedure:  HOLMIUM LASER LITHOTRIPSY;  Surgeon: Cleon Gustin, MD;  Location: [facility];  Service: Urology;  Laterality: Right;  . HOLMIUM LASER APPLICATION Left 2/99/2426   Procedure: HOLMIUM LASER APPLICATION;  Surgeon: Cleon Gustin, MD;  Location: [facility];  Service: Urology;  Laterality: Left;  . IR KYPHO LUMBAR INC FX REDUCE BONE BX UNI/BIL CANNULATION INC/IMAGING  02/12/2020  . IR THORACENTESIS ASP PLEURAL SPACE W/IMG GUIDE  10/03/2018  . IR THORACENTESIS ASP PLEURAL SPACE W/IMG GUIDE  06/08/2019  . LEFT HEART CATHETERIZATION WITH CORONARY/GRAFT ANGIOGRAM N/A 02/23/2013   Procedure: LEFT HEART CATHETERIZATION WITH Beatrix Fetters;  Surgeon: Blane Ohara, MD;  Location: [facility] LAB;  Service: Cardiovascular;  Laterality: N/A;  . LOOP RECORDER INSERTION N/A 08/30/2017   Procedure: LOOP RECORDER INSERTION;  Surgeon: Evans Lance, MD;  Location: [facility] CV [facility];  Service: Cardiovascular;  Laterality: N/A;  . LOOP RECORDER REMOVAL N/A 08/10/2020   Procedure: LOOP RECORDER REMOVAL;  Surgeon: Evans Lance, MD;  Location: [place] [facility];  Service: Cardiovascular;  Laterality: N/A;  . PACEMAKER IMPLANT N/A 08/10/2020   Procedure: PACEMAKER IMPLANT;  Surgeon: Evans Lance, MD;  Location: [facility] CV LAB;  Service: Cardiovascular;  Laterality: N/A;  . PORTACATH PLACEMENT Left 01/18/2016   Procedure: INSERTION PORT-A-CATH;  Surgeon: Alphonsa Overall, MD;  Location: [facility];  Service: General;  Laterality: Left;  . portacath removal    . REMOVAL OF PLEURAL DRAINAGE  CATHETER Right 07/14/2019   Procedure: REMOVAL OF PLEURAL DRAINAGE CATHETER;  Surgeon: Gaye Pollack, MD;  Location: [facility];  Service: Thoracic;  Laterality: Right;  . TALC PLEURODESIS N/A 06/26/2019   Procedure: Pietro Cassis;  Surgeon: Gaye Pollack, MD;  Location: [facility];  Service: Thoracic;  Laterality: N/A;  . TONSILLECTOMY    . TRANSTHORACIC ECHOCARDIOGRAM  02-24-2013      mild LVH,  ef 50-55%/  AV bioprosthesis was present with very mild stenosis and no regurg., mean grandient 22mHg, peak grandient 225mg /  mild MR/  mild LAE and RAE/  moderate TR  . TUBAL LIGATION      Family History  Problem Relation Age of Onset  . Heart disease Father   . Heart failure Father   . Diabetes  Maternal Grandmother   . Heart disease Maternal Grandmother   . Diabetes Son   . Healthy Brother        #1  . Heart attack Brother        #2  . Heart disease Brother        #2  . Colon cancer Neg Hx   . Esophageal cancer Neg Hx     Social History:  reports that she has never smoked. She has never used smokeless tobacco. She reports that she does not drink alcohol and does not use drugs.    Review of Systems    Lipid history: On 40 mg Crestor along with Zetia      Lab Results  Component Value Date   CHOL 106 02/23/2020   HDL 33 (L) 02/23/2020   LDLCALC 58 02/23/2020   LDLDIRECT 53.0 10/08/2016   TRIG 74 02/23/2020   CHOLHDL 3.2 02/23/2020           Eyes:  No history of retinopathy  She is followed by nephrologist for renal dysfunction which shows variable results   Lab Results  Component Value Date   CREATININE 2.42 (H) 10/21/2020   CREATININE 2.29 (H) 10/14/2020   CREATININE 2.48 (H) 09/16/2020    Diabetic foot exam in 3/20: She has significant loss of monofilament sensation in her distal feet She thinks her neuropathy started after her chemotherapy She has difficulty with balance She has significant numbness but no pain or paresthesia  BLOOD pressure: She has variable  blood pressure readings as below  BP Readings from Last 3 Encounters:  10/26/20 120/72  10/14/20 (!) 105/31  09/16/20 (!) 157/57   She is on amiodarone 200 mg, thyroid levels have been normal  Lab Results  Component Value Date   TSH 3.247 08/02/2020      Physical Examination:  BP 120/72 (BP Location: Right Wrist, Patient Position: Sitting, Cuff Size: Normal)   Pulse 77   Ht 5' 4" (1.626 m)   Wt 135 lb 6.4 oz (61.4 kg)   LMP  (LMP Unknown)   SpO2 (!) 88%   BMI 23.24 kg/m     ASSESSMENT:  Diabetes type 2, on insulin    See history of present illness for detailed discussion of  current management, blood sugar patterns and problems identified  Blood sugar prior readings, readings and averages reviewed from her meter and compared to previous data  A1c is 7.3  Continues to be on basal bolus insulin injections and Trulicity  She has variable postprandial hyperglycemia and and despite taking Trulicity seems to be needing more mealtime coverage compared to basal insulin As before some sugars may be higher from steroids taking on and off She is trying to be fairly consistent with her diet especially with adding protein at breakfast Unable to do much physical exercise Weight is about the same She does not increase her insulin sufficiently because of fear of hypoglycemia   PLAN:    She will increase her Humalog up to 12 or 14 units in the morning and 8-10 to be continued at lunch and dinner based on meal size She will not increase her Lantus until her morning sugars are around 140 or more and may go back up gradually to 22 units As before she will need to take additional doses when she gets steroids  Discussed in detail use of continuous glucose monitoring, she will not familiar with the sensor and was confusing it with the insulin pump Because of her  needing to check her sugars at least 4 times a day and variable blood sugars she will be benefiting from the CGM and she  will look into this Patient information brochure and list of DME supplies was given, she will try to call [facility]  She will again try to make sure she takes her Humalog right at the mealtime To call if she has any consistently high or low readings  Continue 1.5 mg Trulicity, may increase the dose if she has weight gain   Patient Instructions  Breakfast dose should be 12-14 units, stay with 10 at other meals     Total visit time including counseling = 30 minutes   Elayne Snare 10/26/2020, 3:37 PM   Note: This office note was prepared with Dragon voice recognition system technology. Any transcriptional errors that result from this process are unintentional.

## 2020-10-27 ENCOUNTER — Ambulatory Visit (INDEPENDENT_AMBULATORY_CARE_PROVIDER_SITE_OTHER): Payer: Medicare Other | Admitting: Orthopaedic Surgery

## 2020-10-27 ENCOUNTER — Encounter: Payer: Self-pay | Admitting: Orthopaedic Surgery

## 2020-10-27 DIAGNOSIS — M19012 Primary osteoarthritis, left shoulder: Secondary | ICD-10-CM | POA: Diagnosis not present

## 2020-10-27 IMAGING — US US BREAST*R* LIMITED INC AXILLA
1 series · 13 of 18 positions shown · non-contrast
Comparison: Previous exam(s).

CLINICAL DATA: Patient with history of right breast cancer status
post lumpectomy 5467.

EXAM:
DIGITAL DIAGNOSTIC BILATERAL MAMMOGRAM WITH CAD AND TOMO
ULTRASOUND RIGHT BREAST

[Series 1: us breast*right* limited inc axilla · 0.07mm/px · 13 of 18 slices shown]
[im 1/18]
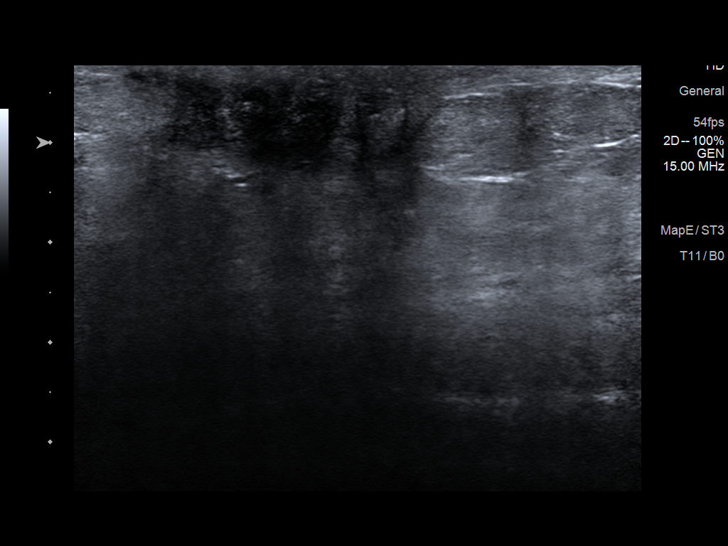
[im 3/18]
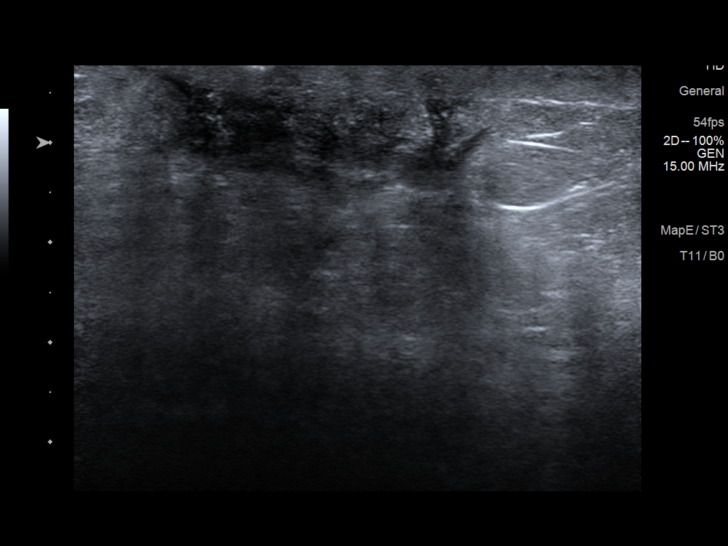
[im 4/18]
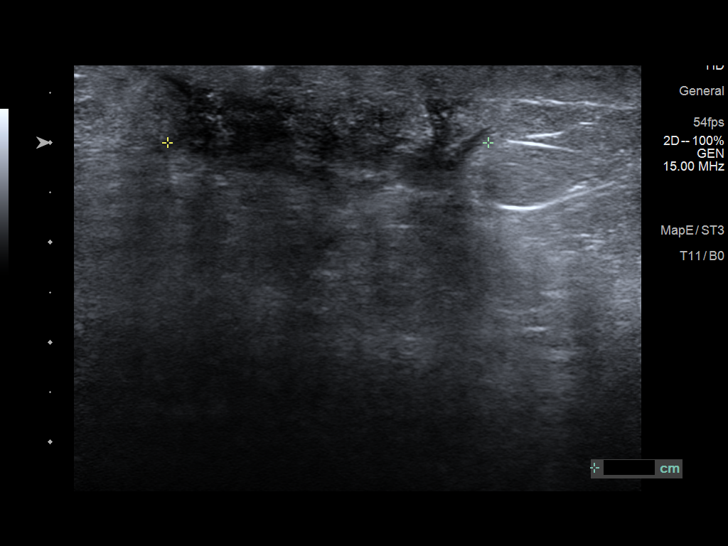
[im 5/18]
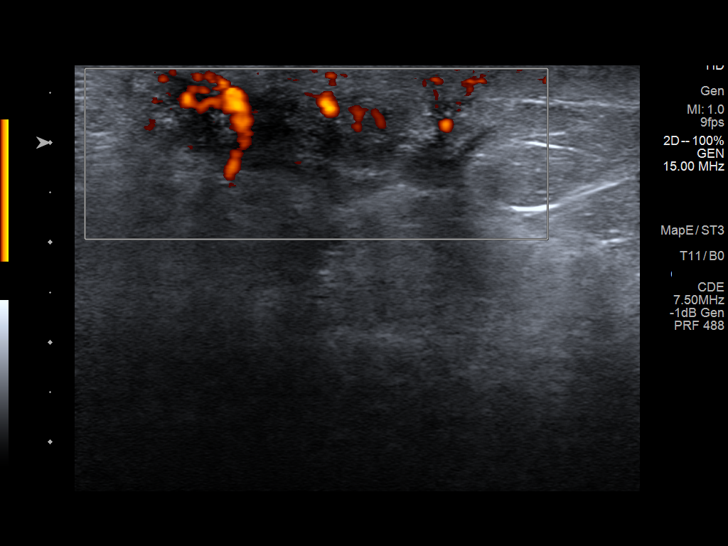
[im 7/18]
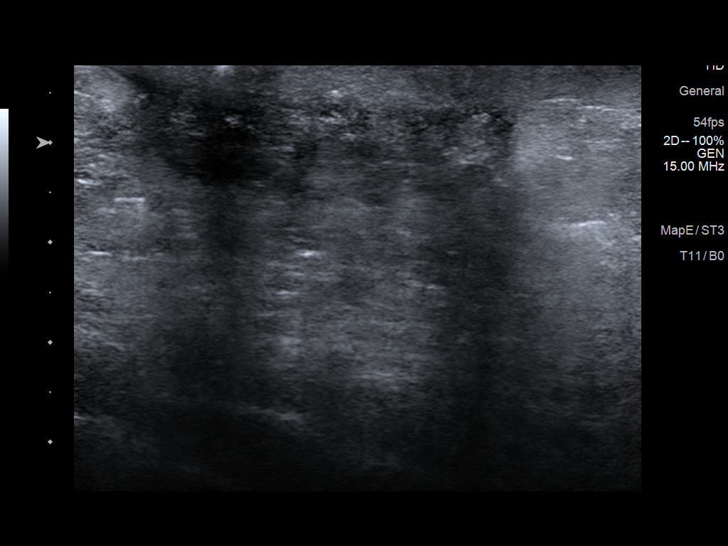
[im 8/18]
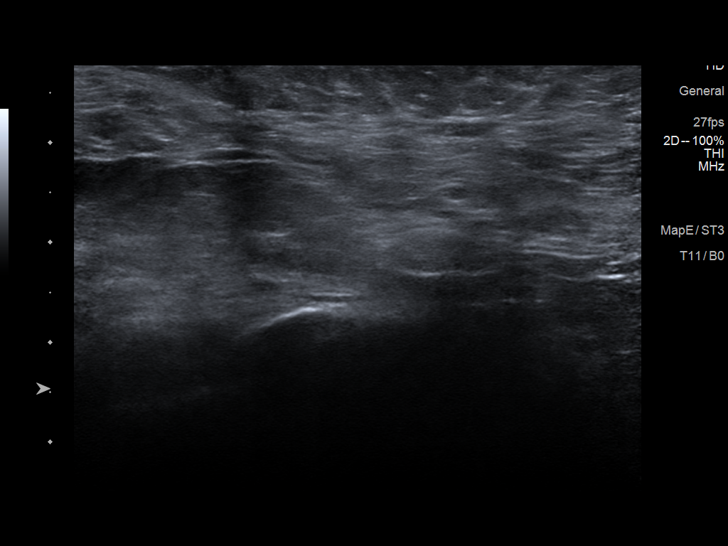
[im 10/18]
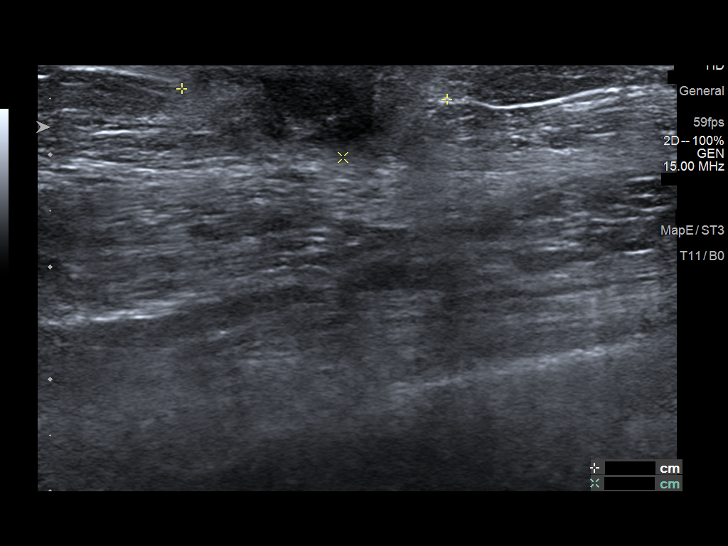
[im 11/18]
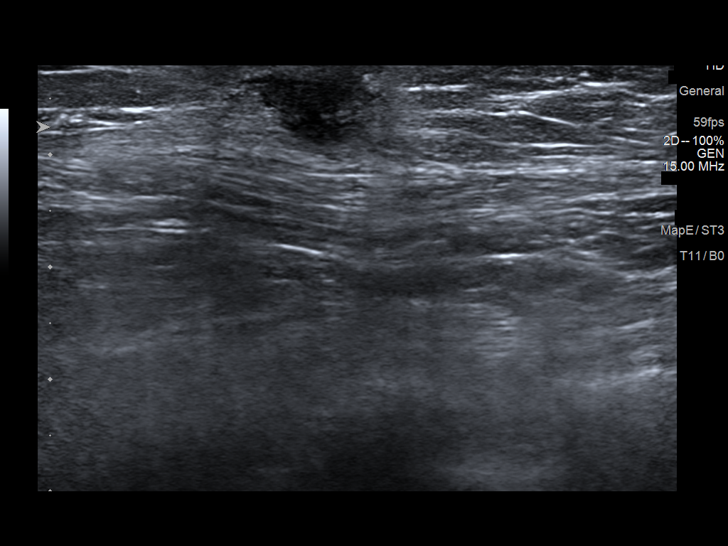
[im 12/18]
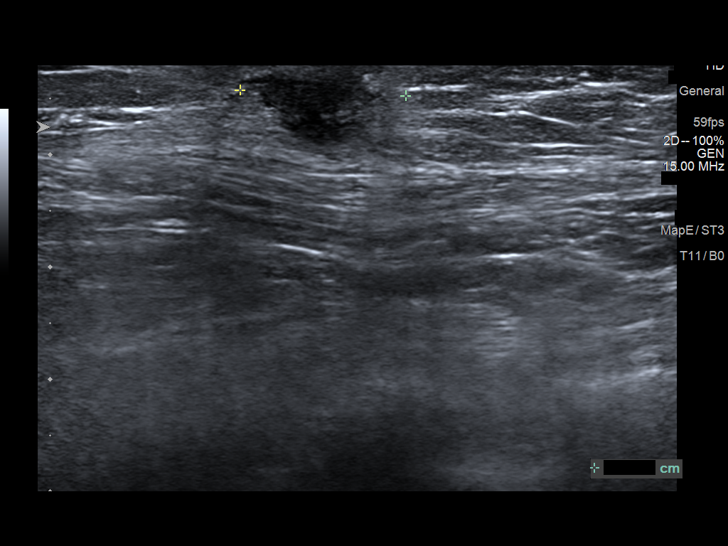
[im 14/18]
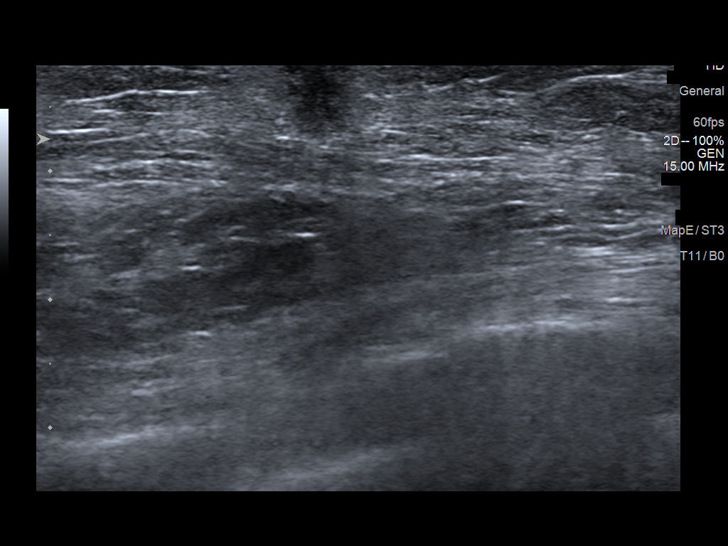
[im 15/18]
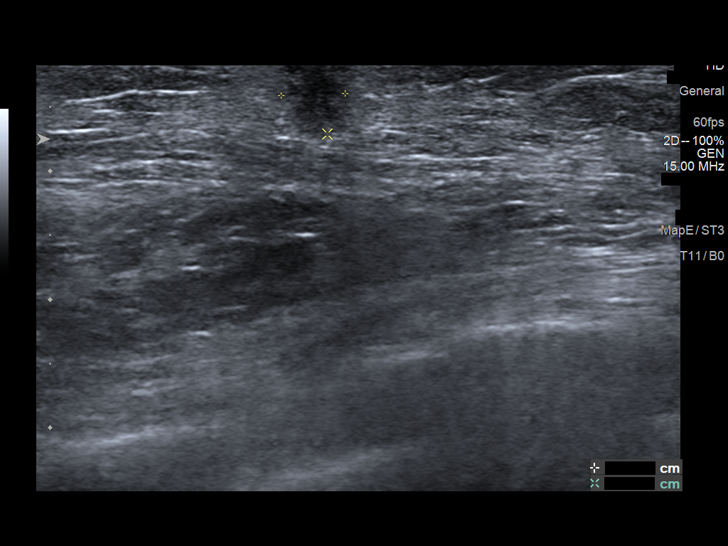
[im 16/18]
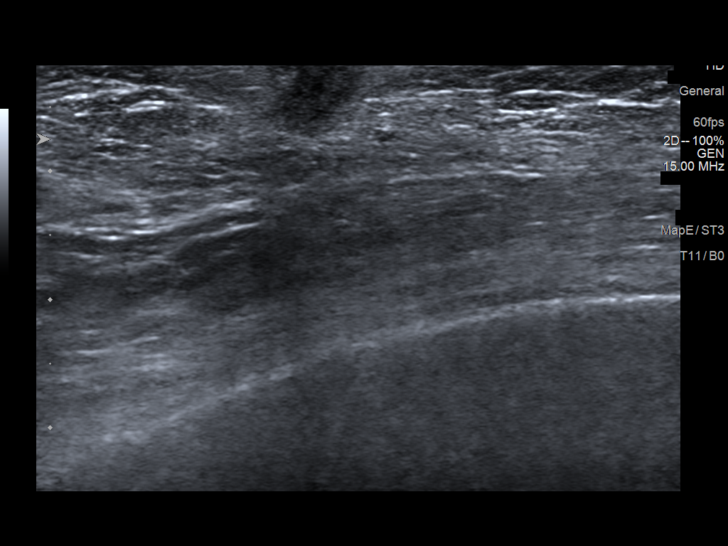
[im 18/18]
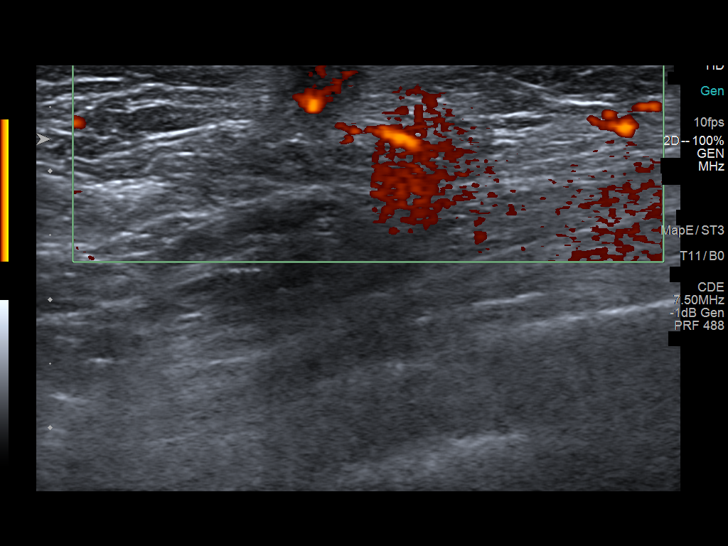

[13 of 18 positions shown; findings below may reference images not displayed]

ACR Breast Density Category c: The breast tissue is heterogeneously
dense, which may obscure small masses.
FINDINGS: Lumpectomy changes demonstrated within the right breast. Along the
posterior outer aspect of the right breast deep to the scar there is
a new asymmetry. This was further evaluated with additional views
including exaggerated CC lateral and true lateral tomosynthesis
images. No additional concerning masses, calcifications or
nonsurgical distortion identified within either breast.

Mammographic images were processed with CAD.

On physical exam, there is discoloration along the posterior aspect
of the scar site involving the lower outer right breast. There are 2
focal areas of skin dimpling involving the outer aspect of the right
axilla.

Targeted ultrasound is performed, showing a 2.8 x 1.2 x 3.2 cm
irregular hypoechoic mass with internal vascularity right breast 8
o'clock position 10 cm from nipple along the posterior aspect of the
scar at the site of overlying cutaneous discoloration.

There is no right axillary lymph node identified.

Within the outer aspect of the right axilla there is a 2.4 x 0.9 x
1.5 cm irregular hypoechoic mass.

Adjacent to this is a 0.5 x 0.6 x 0.7 cm irregular hypoechoic mass.

These masses are just deep to the areas of skin dimpling within the
outer aspect of the right axilla.
IMPRESSION: 1. New irregular hypoechoic mass along the posterior margin of the
scar right breast 8 o'clock position. Findings are nonspecific
however concerning for the possibility of localized recurrence.
2. Within the outer aspect of the right axilla there are two
adjacent irregular hypoechoic masses just deep to the skin which are
nonspecific. These may represent focal fat necrosis or potentially
recurrent or metastatic disease.

RECOMMENDATION:
1. Ultrasound-guided core needle biopsy right breast mass 8 o'clock
position just deep to the scar.
2. Ultrasound-guided core needle biopsy of both irregular hypoechoic
masses within the outer aspect of the right axilla.

I have discussed the findings and recommendations with the patient.
Results were also provided in writing at the conclusion of the
visit. If applicable, a reminder letter will be sent to the patient
regarding the next appointment.

BI-RADS CATEGORY  4: Suspicious.

## 2020-10-27 IMAGING — MG MM DIGITAL DIAGNOSTIC BILAT W/ TOMO W/ CAD
6 of 9 series · 6 of 25 positions shown · non-contrast
Comparison: Previous exam(s).

CLINICAL DATA: Patient with history of right breast cancer status
post lumpectomy 5467.

EXAM:
DIGITAL DIAGNOSTIC BILATERAL MAMMOGRAM WITH CAD AND TOMO
ULTRASOUND RIGHT BREAST

[R CC]
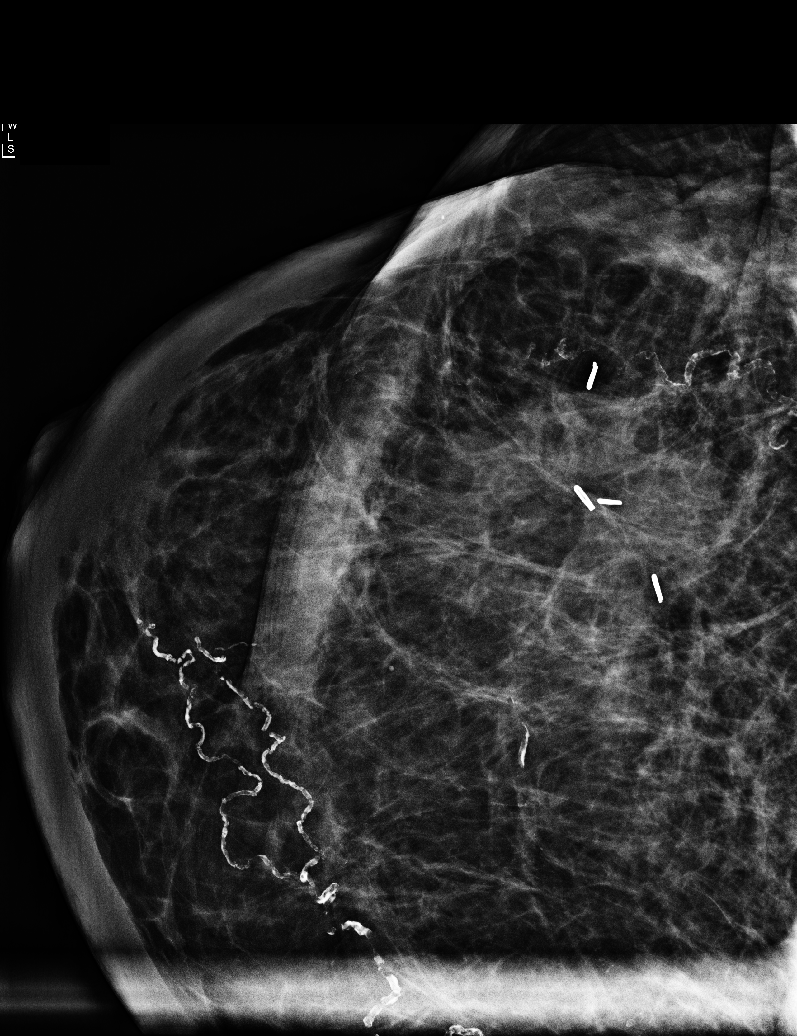

[R MLO synth-2D]
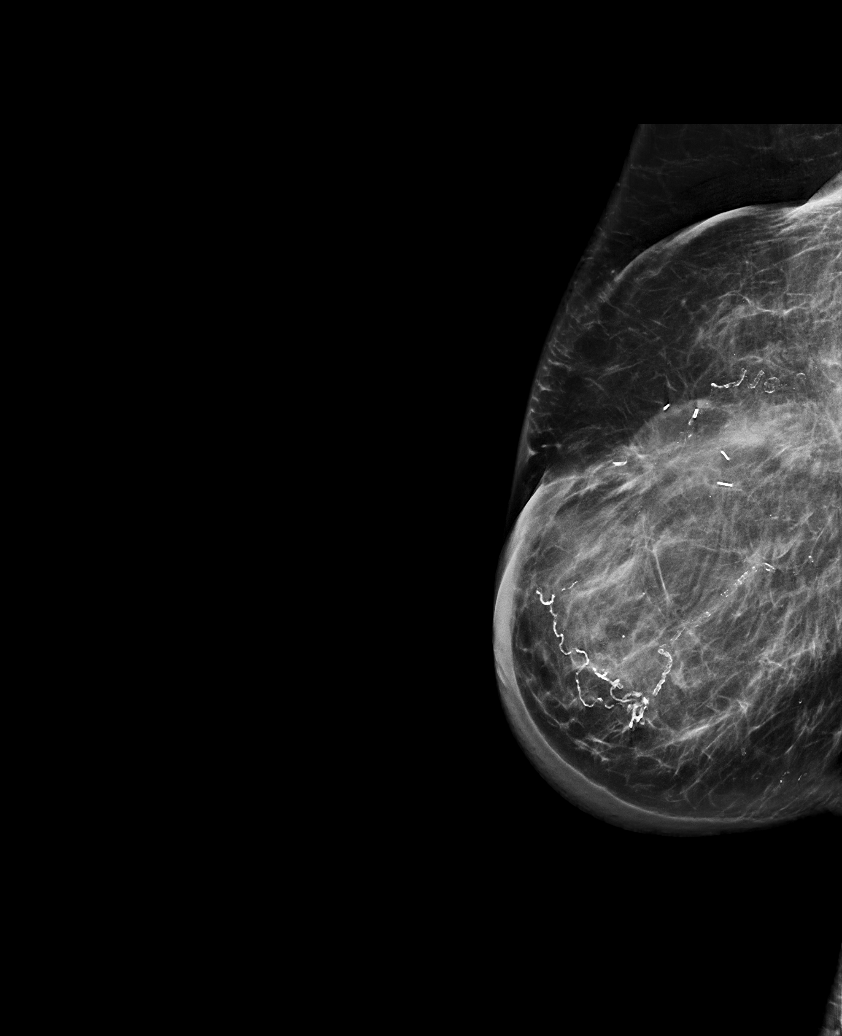

[L MLO synth-2D]
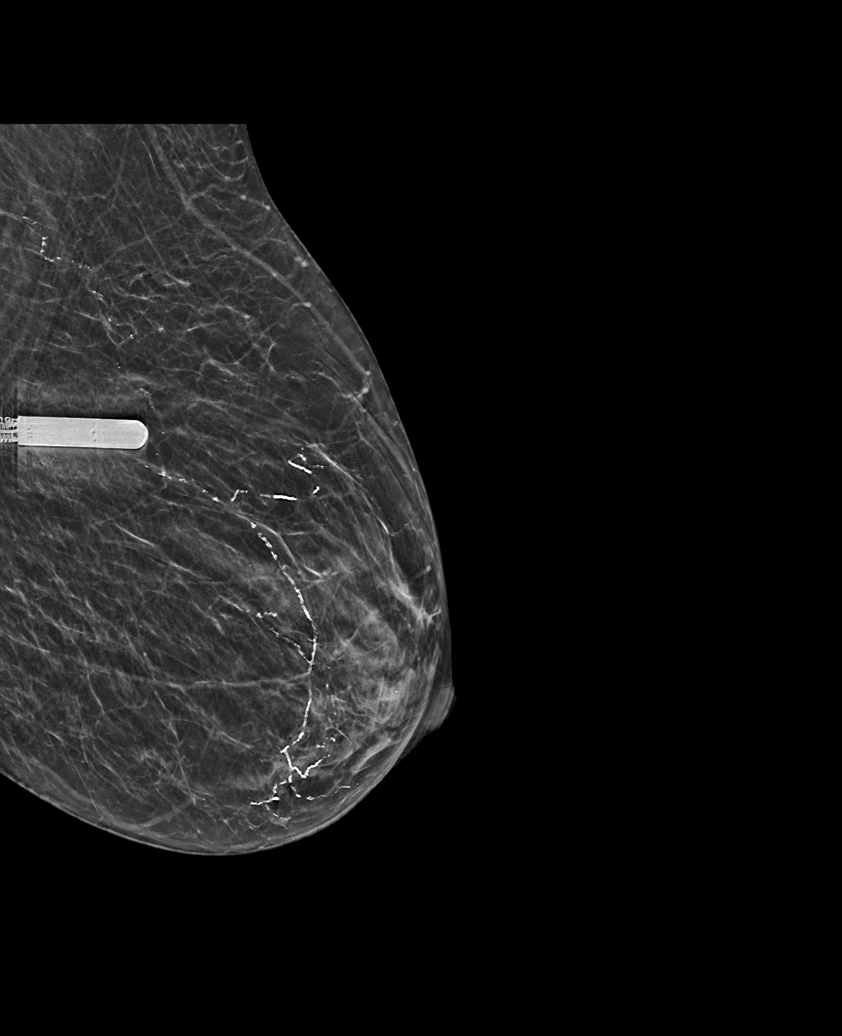

[L CC synth-2D]
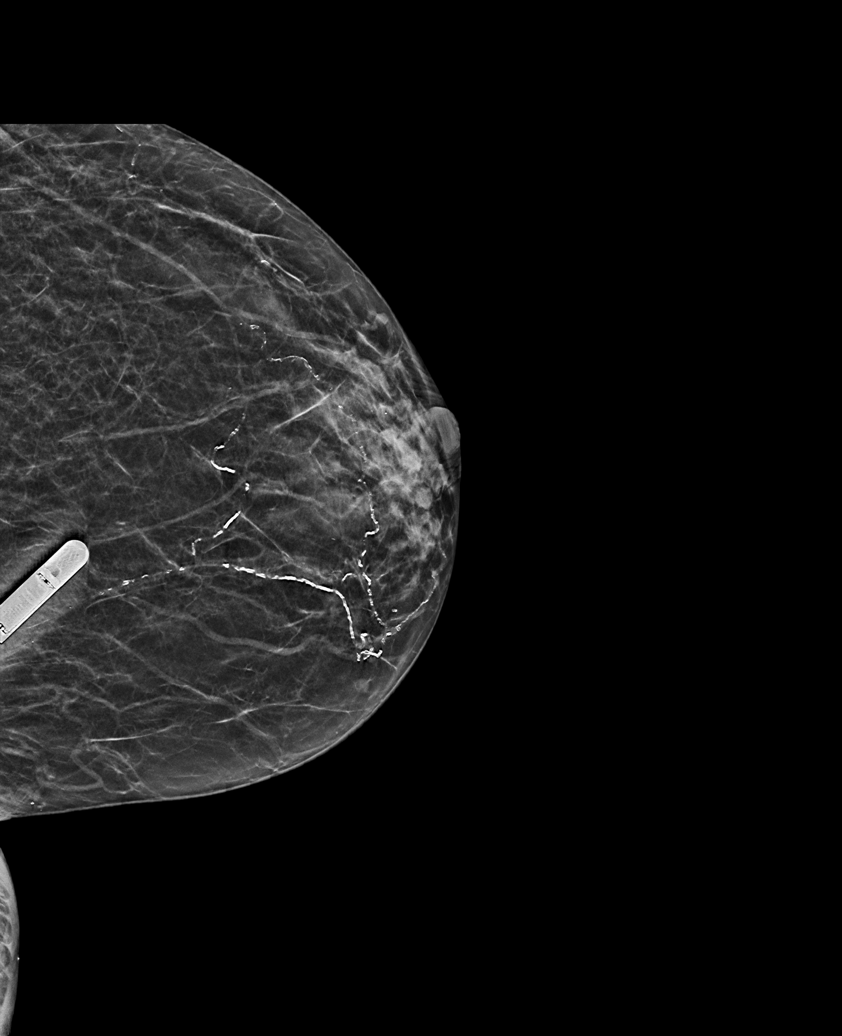

[R CC synth-2D]
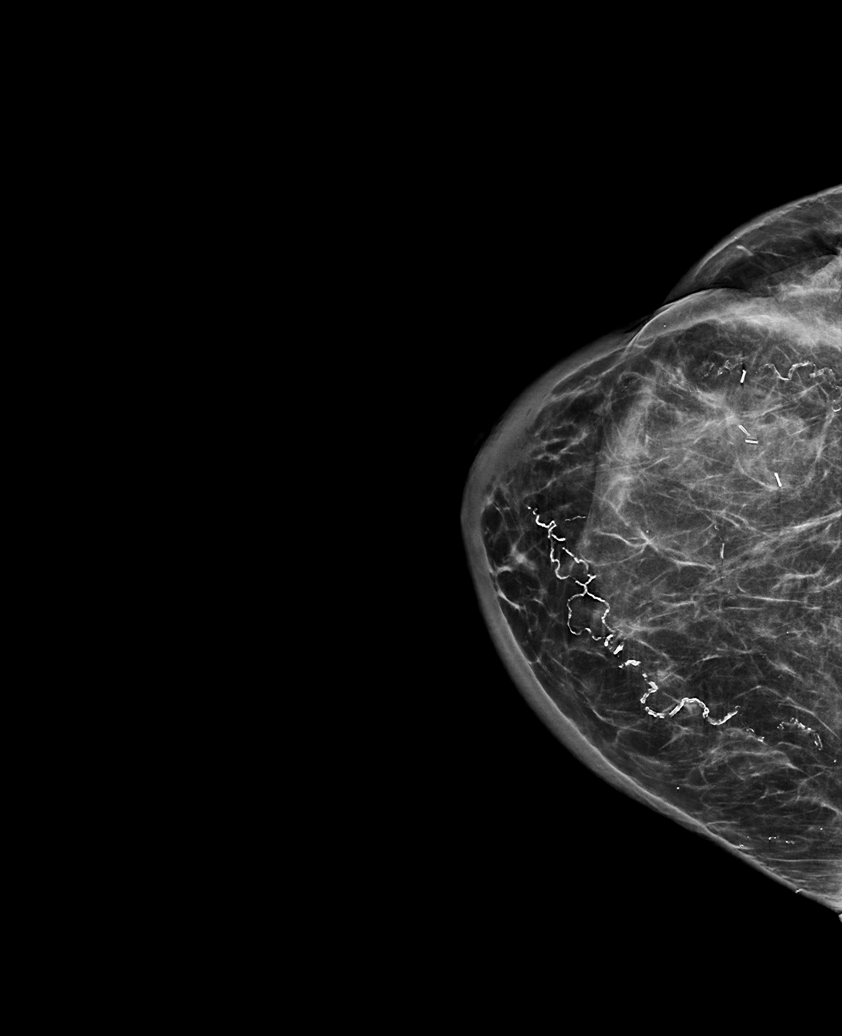

[L MLO tomo · tomo slice 24/47.0]
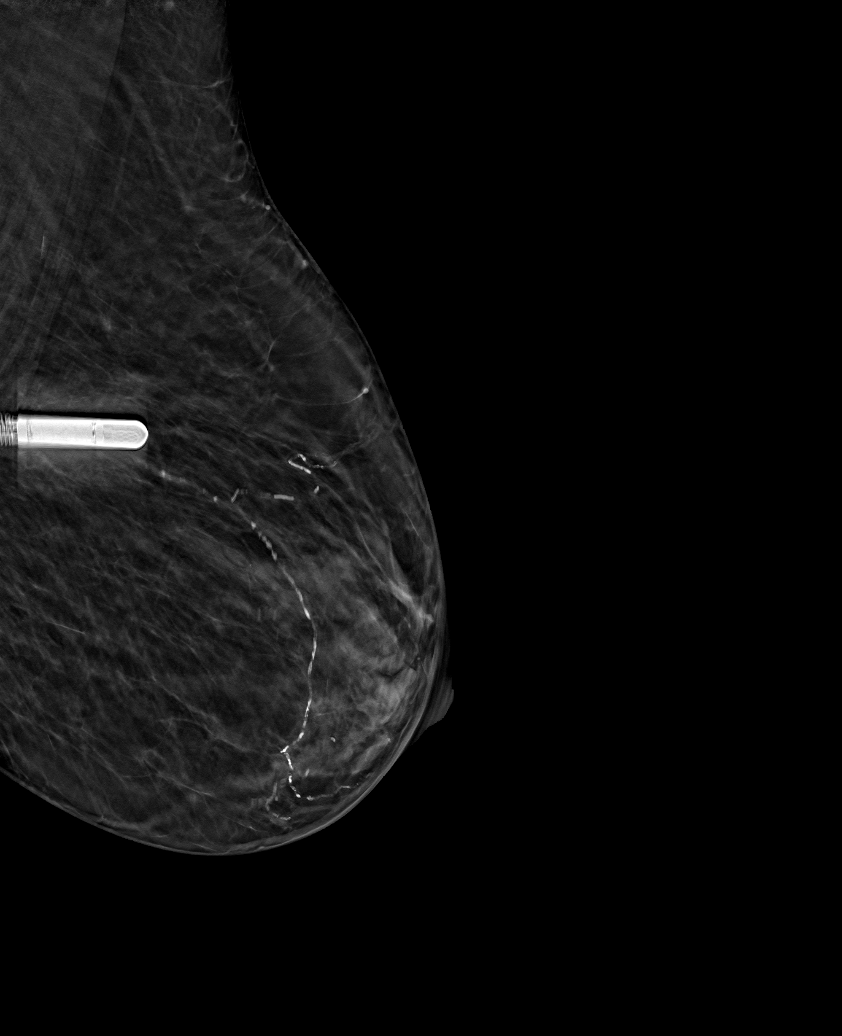

[6 of 25 positions shown; findings below may reference images not displayed]

ACR Breast Density Category c: The breast tissue is heterogeneously
dense, which may obscure small masses.
FINDINGS: Lumpectomy changes demonstrated within the right breast. Along the
posterior outer aspect of the right breast deep to the scar there is
a new asymmetry. This was further evaluated with additional views
including exaggerated CC lateral and true lateral tomosynthesis
images. No additional concerning masses, calcifications or
nonsurgical distortion identified within either breast.

Mammographic images were processed with CAD.

On physical exam, there is discoloration along the posterior aspect
of the scar site involving the lower outer right breast. There are 2
focal areas of skin dimpling involving the outer aspect of the right
axilla.

Targeted ultrasound is performed, showing a 2.8 x 1.2 x 3.2 cm
irregular hypoechoic mass with internal vascularity right breast 8
o'clock position 10 cm from nipple along the posterior aspect of the
scar at the site of overlying cutaneous discoloration.

There is no right axillary lymph node identified.

Within the outer aspect of the right axilla there is a 2.4 x 0.9 x
1.5 cm irregular hypoechoic mass.

Adjacent to this is a 0.5 x 0.6 x 0.7 cm irregular hypoechoic mass.

These masses are just deep to the areas of skin dimpling within the
outer aspect of the right axilla.
IMPRESSION: 1. New irregular hypoechoic mass along the posterior margin of the
scar right breast 8 o'clock position. Findings are nonspecific
however concerning for the possibility of localized recurrence.
2. Within the outer aspect of the right axilla there are two
adjacent irregular hypoechoic masses just deep to the skin which are
nonspecific. These may represent focal fat necrosis or potentially
recurrent or metastatic disease.

RECOMMENDATION:
1. Ultrasound-guided core needle biopsy right breast mass 8 o'clock
position just deep to the scar.
2. Ultrasound-guided core needle biopsy of both irregular hypoechoic
masses within the outer aspect of the right axilla.

I have discussed the findings and recommendations with the patient.
Results were also provided in writing at the conclusion of the
visit. If applicable, a reminder letter will be sent to the patient
regarding the next appointment.

BI-RADS CATEGORY  4: Suspicious.

## 2020-10-27 MED ORDER — LIDOCAINE HCL 1 % IJ SOLN
2.0000 mL | INTRAMUSCULAR | Status: AC | PRN
  Administered 2020-10-27: 2 mL

## 2020-10-27 MED ORDER — BUPIVACAINE HCL 0.25 % IJ SOLN
2.0000 mL | INTRAMUSCULAR | Status: AC | PRN
  Administered 2020-10-27: 2 mL via INTRA_ARTICULAR

## 2020-10-27 NOTE — Progress Notes (Signed)
Office Visit Note   Patient: Hannah [name]           Date of Birth: [birth year]           MRN: [id number] Visit Date: 10/27/2020              Requested by: [name], [address] [place],  [place] [address] PCP: [name], DO   Assessment & Plan: Visit Diagnoses:  1. Primary osteoarthritis, left shoulder     Plan: Mrs. Stauder has an established diagnosis of osteoarthritis of her left shoulder.  She has had a prior humeral head fracture with secondary changes of arthritis.  She has had good results with cortisone in the past.  Will inject the glenohumeral  joint today.  Follow-Up Instructions: Return if symptoms worsen or fail to improve.   Orders:  Orders Placed This Encounter  Procedures  . Large Joint Inj: L glenohumeral   No orders of the defined types were placed in this encounter.     Procedures: Large Joint Inj: L glenohumeral on 10/27/2020 5:18 PM Details: 25 G 1.5 in needle, anteromedial approach  Arthrogram: No  Medications: 2 mL lidocaine 1 %; 2 mL bupivacaine 0.25 %  12 mg betamethasone injected into the left glenohumeral joint with Xylocaine and Marcaine Procedure, treatment alternatives, risks and benefits explained, specific risks discussed. Consent was given by the patient. Immediately prior to procedure a time out was called to verify the correct patient, procedure, equipment, support staff and site/side marked as required. Patient was prepped and draped in the usual sterile fashion.       Clinical Data: No additional findings.   Subjective: Chief Complaint  Patient presents with  . Left Shoulder - Pain  Patient presents today for recurrent chronic left shoulder pain. She had her left shoulder injected with cortisone in September of last year. She states that it helped for a short period. She would like to get another injection today, in hopes that it will last longer than the first one did. She takes Tylenol for pain relief, as she  cannot take anything else.  HPI  Review of Systems   Objective: Vital Signs: Ht 5\' 4"  (1.626 m)   Wt 135 lb (61.2 kg)   LMP  (LMP Unknown)   BMI 23.17 kg/m   Physical Exam Constitutional:      Appearance: She is well-developed.  Pulmonary:     Effort: Pulmonary effort is normal.  Skin:    General: Skin is warm and dry.  Neurological:     Mental Status: She is alert and oriented to person, place, and time.  Psychiatric:        Behavior: Behavior normal.     Ortho Exam limited range of motion of the left shoulder with only about 90 degrees of flexion and about 80 degrees of abduction.  There is some loss of external rotation.  Skin intact good grip and release.  Seems to have good strength  Specialty Comments:  No specialty comments available.  Imaging: No results found.   PMFS History: Patient Active Problem List   Diagnosis Date Noted  . Primary osteoarthritis, left shoulder 10/27/2020  . Tachycardia-bradycardia syndrome ([place]) 08/10/2020  . Convulsion ([place]) 05/30/2020  . Compression fracture of L1 vertebra with routine healing 02/25/2020  . Compression fracture of L1 lumbar vertebra, closed, initial encounter ([place]) 01/27/2020  . Pain in left shoulder 03/26/2019  . History of breast cancer in female 03/26/2019  . Hypokalemia 02/20/2019  .  Acute exacerbation of CHF (congestive heart failure) ([place]) 02/19/2019  . Pleural effusion on right 09/25/2018  . Thrombocytopenia ([place]) 09/25/2018  . Cholecystitis with cholelithiasis 07/07/2018  . Prolonged Q-T interval on ECG 05/21/2018  . Atrial fibrillation ([place]) 01/23/2018  . Closed fracture of head of left humerus 09/09/2017  . Syncope 09/06/2017  . Seizure-like activity ([place]) 09/01/2017  . AKI (acute kidney injury) ([place]) 09/01/2017  . Adrenal insufficiency ([place]) 07/03/2016  . Seizure ([place]) 06/29/2016  . 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 Chemotherapy-induced thrombocytopenia 04/06/2016   . Osteomyelitis of toe of left foot ([place]) 04/06/2016  . Mania ([place]) 03/05/2016  . GERD (gastroesophageal reflux disease) 02/29/2016  . Chemotherapy induced nausea and vomiting 02/24/2016  . Port catheter in place 02/17/2016  . Sensorineural hearing loss (SNHL) of both ears 01/06/2016  . Breast cancer of upper-inner quadrant of right female breast ([place]) 12/16/2015  . Osteopenia of multiple sites 10/19/2015  . CKD (chronic kidney disease), stage IV ([place]) 09/24/2013  . Chronic diastolic CHF (congestive heart failure) ([place]) 09/24/2013  . Acute on chronic combined systolic and diastolic congestive heart failure ([place]) 07/17/2013  . Atrial tachycardia ([place]) 04/03/2013  . CAD (coronary artery disease) 01/24/2011  . Chronic coronary artery disease 01/24/2011  . History of aortic valve replacement 10/27/2010  . Hyperlipidemia   . DOE (dyspnea on exertion)   . Nonischemic cardiomyopathy ([place])   . Anemia of chronic disease   . Type 2 diabetes mellitus ([place])   . Iron deficiency anemia   . Allergic rhinitis 10/14/2009  . Essential hypertension 05/12/2009  . H/O atrial tachycardia 05/12/2009  . Asthma 05/12/2009   Past Medical History:  Diagnosis Date  . Abnormally small mouth   . Allergic rhinitis 10/14/2009   Qualifier: Diagnosis of  By: Lamonte Sakai MD, Rose Fillers   Overview:  Overview:  Qualifier: Diagnosis of  By: Lamonte Sakai MD, Rose Fillers  Last Assessment & Plan:  Please continue Xyzal and Nasacort as you have been using them  . Anemia   . Asthma 05/12/2009   10/12/2014 p extensive coaching HFA effectiveness =    75% s spacer    Overview:  Overview:  10/12/2014 p extensive coaching HFA effectiveness =    75% s spacer   Last Assessment & Plan:  Please continue Symbicort 2 puffs twice a day. Remember to rinse and gargle after taking this medication. Take albuterol 2 puffs up to every 4 hours if needed for shortness of breath.  Follow with Dr Lamonte Sakai in 6 month  . Bilateral carotid artery stenosis    a. mild 1-39%  by duplex 10/2017, due 2021.  . Breast cancer ([place]) 01/18/2016   right breast  . CAD (coronary artery disease) 01/24/2011   a. s/p CABG 1998 with redo 2009.  Marland Kitchen Chemotherapy induced nausea and vomiting 02/24/2016  . 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 Chronic diastolic CHF (congestive heart failure) ([place]) 09/24/2013  . CKD (chronic kidney disease), stage IV ([place]) 09/24/2013  . Closed fracture of head of left humerus 09/09/2017  . Complication of anesthesia   . Dysrhythmia    a-fib  . Gallstones   . GERD (gastroesophageal reflux disease)   . Glaucoma   . H/O atrial tachycardia 05/12/2009   Qualifier: History of  By: Lamonte Sakai MD, Rose Fillers   Overview:  Overview:  Qualifier: History of  By: Lamonte Sakai MD, Rose Fillers  Last Assessment & Plan:  There was no evidence of this on her  cardiac monitor. I would recommend watchful waiting.   Marland Kitchen Heart murmur   . History of kidney stones   . History of non-ST elevation myocardial infarction (NSTEMI)    Sept 2014--  thought to be type II HTN w/ LHC without infarct related artery and patent grafts  . History of radiation therapy 05/24/16-07/26/16   right breast 50.4 Gy in 28 fractions, right breast boost 10 Gy in 5 fractions  . Hyperlipidemia   . Hypotension   . Iron deficiency anemia   . Mania ([place]) 03/05/2016  . Moderate persistent asthma    pulmologist-  Dr. Malvin Johns  . Osteomyelitis of toe of left foot ([place]) 04/06/2016  . Osteopenia of multiple sites 10/19/2015  . PAF (paroxysmal atrial fibrillation) ([place])   . Personal history of chemotherapy 2017  . Personal history of radiation therapy 2017  . PONV (postoperative nausea and vomiting)   . Port catheter in place 02/17/2016  . Psoriasis    right leg  . Renal calculus, right   . S/P AVR    prosthesis valve placement 2009 at same time re-do CABG  . Seizure-like activity ([place]) 09/01/2017  . Sensorineural hearing loss (SNHL) of both ears  01/06/2016  . Stroke [facility]) 2014   residual rt hearing loss  . Syncope 09/06/2017  . Type 2 diabetes mellitus ([place])    monitored by dr Dwyane Dee    Family History  Problem Relation Age of Onset  . Heart disease Father   . Heart failure Father   . Diabetes Maternal Grandmother   . Heart disease Maternal Grandmother   . Diabetes Son   . Healthy Brother        #1  . Heart attack Brother        #2  . Heart disease Brother        #2  . Colon cancer Neg Hx   . Esophageal cancer Neg Hx     Past Surgical History:  Procedure Laterality Date  . AORTIC VALVE REPLACEMENT  2009   #62mm [facility] Ease pericardial valve (done same time is CABG)  . BREAST BIOPSY Right 02/12/2019   2 lymphnodes, 1 breast  . BREAST LUMPECTOMY Right 01/18/2016  . BREAST LUMPECTOMY WITH RADIOACTIVE SEED AND SENTINEL LYMPH NODE BIOPSY Right 01/18/2016   Procedure: RIGHT BREAST LUMPECTOMY WITH RADIOACTIVE SEED AND SENTINEL LYMPH NODE BIOPSY;  Surgeon: Alphonsa Overall, MD;  Location: [facility];  Service: General;  Laterality: Right;  . CARDIAC CATHETERIZATION  03/23/2008   Pre-redo CABG: L main OK, LAD (T), CFX (T), OM1 99%, RCA (T), LIMA-LAD OK, SVG-OM(?3) OK w/ little florw to OM2, SVG-RCA OK. EF NL  . CARPAL TUNNEL RELEASE    . CHEST TUBE INSERTION Right 06/26/2019   Procedure: INSERTION PLEURAL DRAINAGE CATHETER;  Surgeon: Gaye Pollack, MD;  Location: [facility];  Service: Thoracic;  Laterality: Right;  . CHOLECYSTECTOMY N/A 07/07/2018   Procedure: LAPAROSCOPIC CHOLECYSTECTOMY WITH INTRAOPERATIVE CHOLANGIOGRAM ERAS PATHWAY;  Surgeon: Alphonsa Overall, MD;  Location: [facility];  Service: General;  Laterality: N/A;  . COLONOSCOPY     around 2015. Possibly with [facility]   . CORONARY ARTERY BYPASS GRAFT  1998 &  re-do 2009   Had LIMA to DX/LAD, SVG to 2 marginal branches and SVG to [facility] originally; SVG to 3rd OM and PD at time of redo  . CYSTOSCOPY W/ URETERAL STENT PLACEMENT Right 12/20/2014   Procedure: CYSTOSCOPY WITH RETROGRADE  PYELOGRAM/URETERAL STENT PLACEMENT;  Surgeon: Cleon Gustin, MD;  Location[facility];  Service: Urology;  Laterality: Right;  . CYSTOSCOPY WITH RETROGRADE PYELOGRAM, URETEROSCOPY AND STENT PLACEMENT Left 08/21/2019   Procedure: CYSTOSCOPY WITH RETROGRADE PYELOGRAM, URETEROSCOPY AND STENT PLACEMENT;  Surgeon: Cleon Gustin, MD;  [facility];  Service: Urology;  Laterality: Left;  1 HR  . ESOPHAGOGASTRODUODENOSCOPY     many years ago per patient   . ESOPHAGOGASTRODUODENOSCOPY ENDOSCOPY  06/17/2018  . EYE SURGERY Bilateral    cataracts  . HOLMIUM LASER APPLICATION Right 7/91/5056   Procedure:  HOLMIUM LASER LITHOTRIPSY;  Surgeon: Cleon Gustin, MD;  Location[facility];  Service: Urology;  Laterality: Right;  . HOLMIUM LASER APPLICATION Left 9/79/4801   Procedure: HOLMIUM LASER APPLICATION;  Surgeon: Cleon Gustin, MD;  [facility]: [facility];  Service: Urology;  Laterality: Left;  . IR KYPHO LUMBAR INC FX REDUCE BONE BX UNI/BIL CANNULATION INC/IMAGING  02/12/2020  . IR THORACENTESIS ASP PLEURAL SPACE W/IMG GUIDE  10/03/2018  . IR THORACENTESIS ASP PLEURAL SPACE W/IMG GUIDE  06/08/2019  . LEFT HEART CATHETERIZATION WITH CORONARY/GRAFT ANGIOGRAM N/A 02/23/2013   Procedure: LEFT HEART CATHETERIZATION WITH Beatrix Fetters;  Surgeon: Blane Ohara, MD;  Location: [facility] CATH LAB;  Service: Cardiovascular;  Laterality: N/A;  . LOOP RECORDER INSERTION N/A 08/30/2017   Procedure: LOOP RECORDER INSERTION;  Surgeon: Evans Lance, MD;  Location: [place] CV LAB;  Service: Cardiovascular;  Laterality: N/A;  . LOOP RECORDER REMOVAL N/A 08/10/2020   Procedure: LOOP RECORDER REMOVAL;  Surgeon: Evans Lance, MD;  Location: [place] CV LAB;  Service: Cardiovascular;  Laterality: N/A;  . PACEMAKER IMPLANT N/A 08/10/2020   Procedure: PACEMAKER IMPLANT;  Surgeon: Evans Lance, MD;  Location: [place] CV LAB;  Service: Cardiovascular;   Laterality: N/A;  . PORTACATH PLACEMENT Left 01/18/2016   Procedure: INSERTION PORT-A-CATH;  Surgeon: Alphonsa Overall, MD;  Location: [facility];  Service: General;  Laterality: Left;  . portacath removal    . REMOVAL OF PLEURAL DRAINAGE CATHETER Right 07/14/2019   Procedure: REMOVAL OF PLEURAL DRAINAGE CATHETER;  Surgeon: Gaye Pollack, MD;  Location: [facility];  Service: Thoracic;  Laterality: Right;  . TALC PLEURODESIS N/A 06/26/2019   Procedure: Pietro Cassis;  Surgeon: Gaye Pollack, MD;  Location: [facility];  Service: Thoracic;  Laterality: N/A;  . TONSILLECTOMY    . TRANSTHORACIC ECHOCARDIOGRAM  02-24-2013      mild LVH,  ef 50-55%/  AV bioprosthesis was present with very mild stenosis and no regurg., mean grandient 58mmHg, peak grandient 51mmHg /  mild MR/  mild LAE and RAE/  moderate TR  . TUBAL LIGATION     Social History   Occupational History  . Occupation: retired  Tobacco Use  . Smoking status: Never Smoker  . Smokeless tobacco: Never Used  Vaping Use  . Vaping Use: Never used  Substance and Sexual Activity  . Alcohol use: No  . Drug use: No  . Sexual activity: Yes    Birth control/protection: None

## 2020-10-30 IMAGING — US US  BREAST BX W/ LOC DEV 1ST LESION IMG BX SPEC US GUIDE*R*
1 series · 15 of 24 positions shown · non-contrast
Comparison: Previous exam(s).
COMPARISON: Previous exam(s).

Addendum:
CLINICAL DATA: 73-year-old female with history of right breast
lumpectomy 4664 presents for ultrasound guided core biopsies of
irregular mass along the posterior margin of the scar in the right
breast at the 8 o'clock position, and biopsies of 2 adjacent
irregular hypoechoic masses in the right axilla.

EXAM:
ULTRASOUND GUIDED RIGHT BREAST CORE NEEDLE BIOPSY
ULTRASOUND-GUIDED RIGHT AXILLARY CORE NEEDLE BIOPSY X 2

[Series 1: us breast bx w/ loc dev 1st lesion img bx spec us  · 0.07mm/px · 15 of 24 slices shown]
[im 1/24]
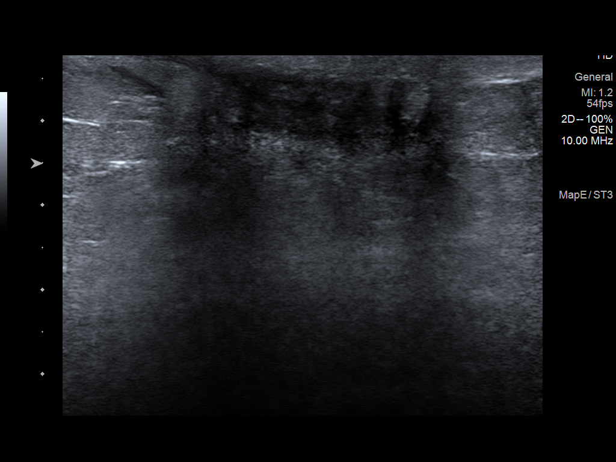
[im 3/24]
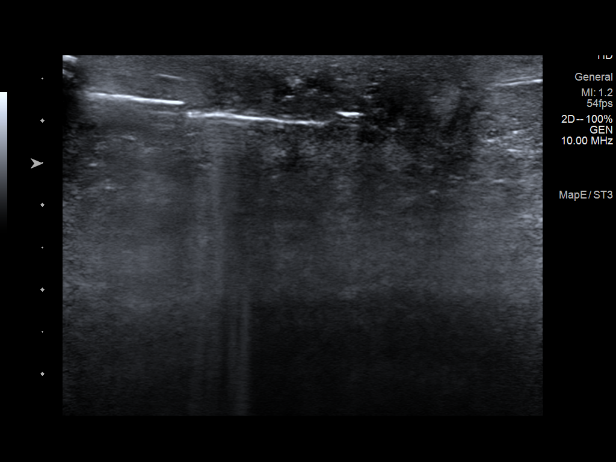
[im 5/24]
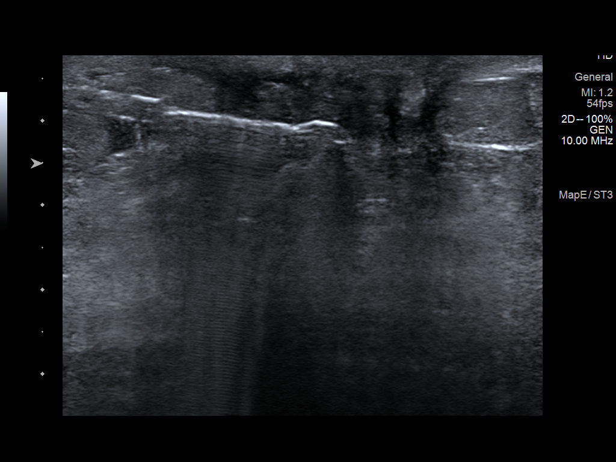
[im 6/24]
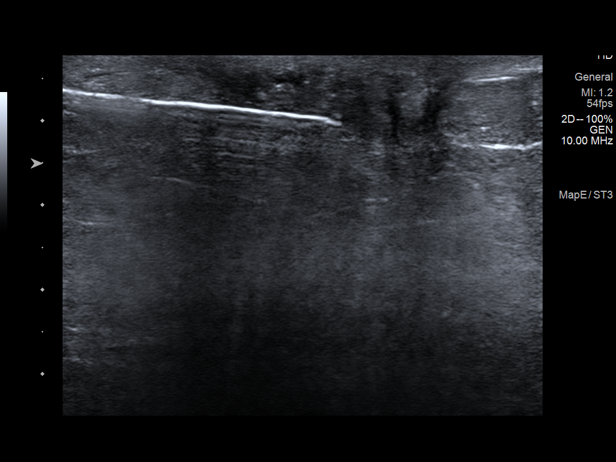
[im 8/24]
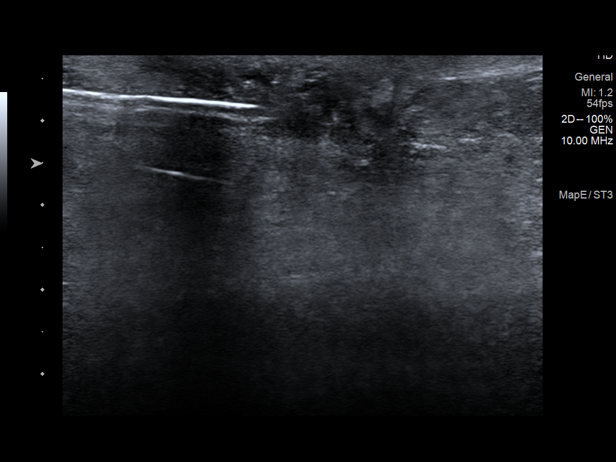
[im 9/24]
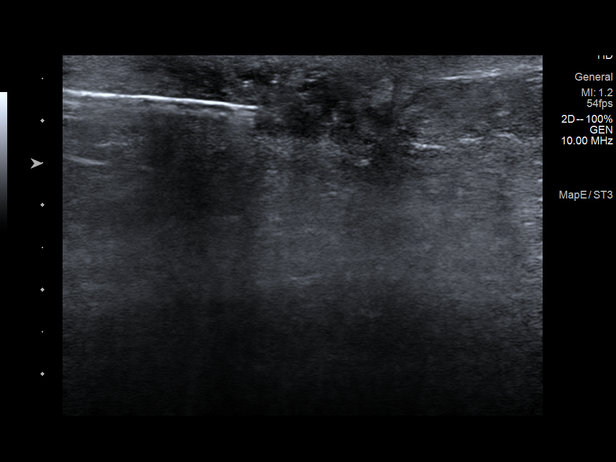
[im 11/24]
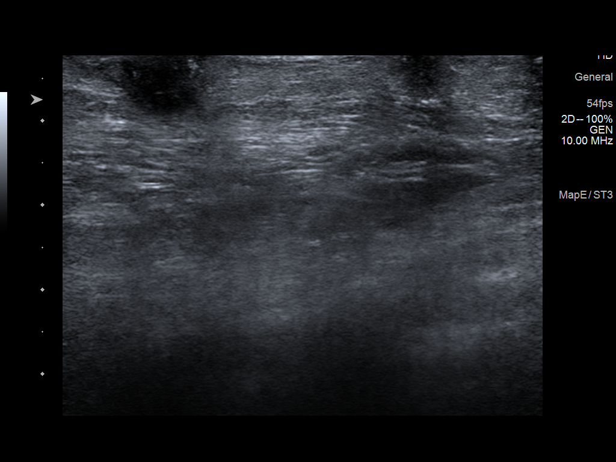
[im 13/24]
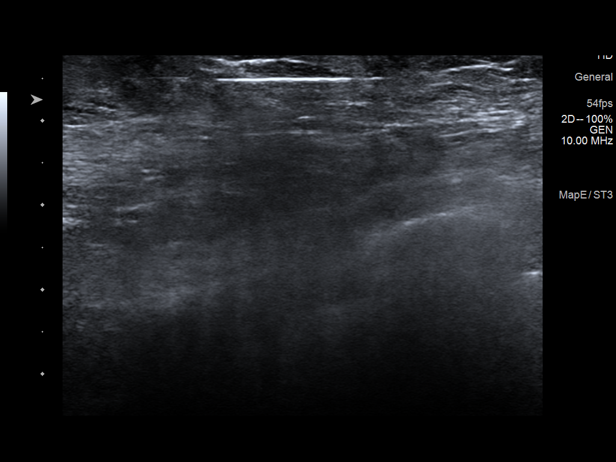
[im 14/24]
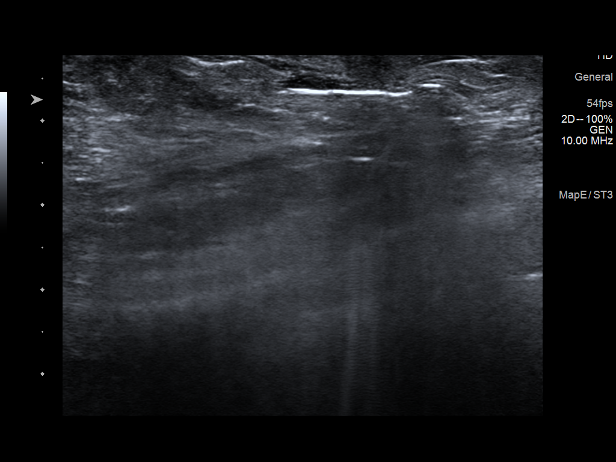
[im 16/24]
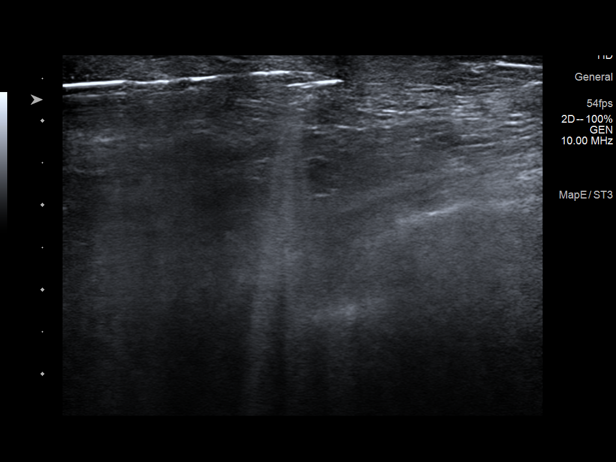
[im 17/24]
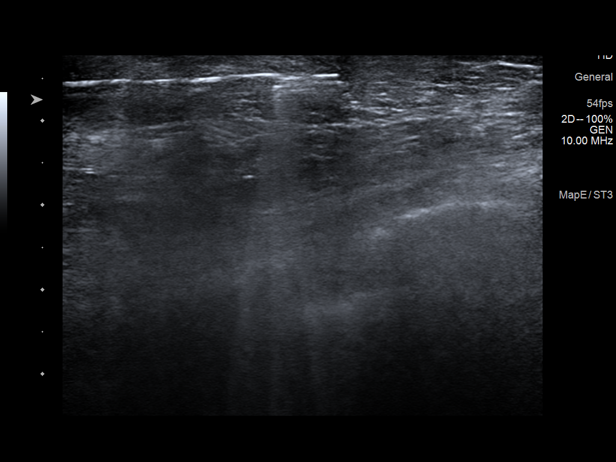
[im 19/24]
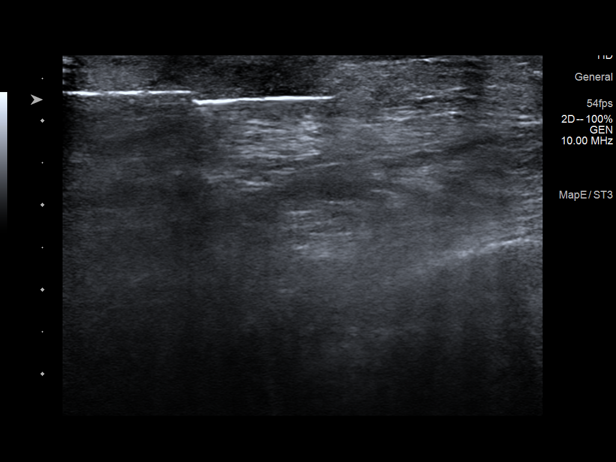
[im 21/24]
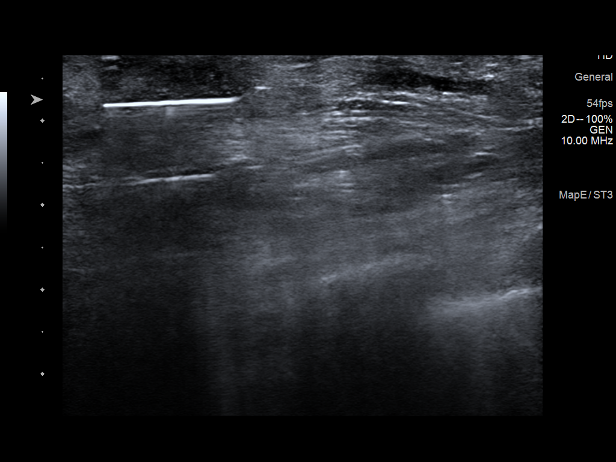
[im 22/24]
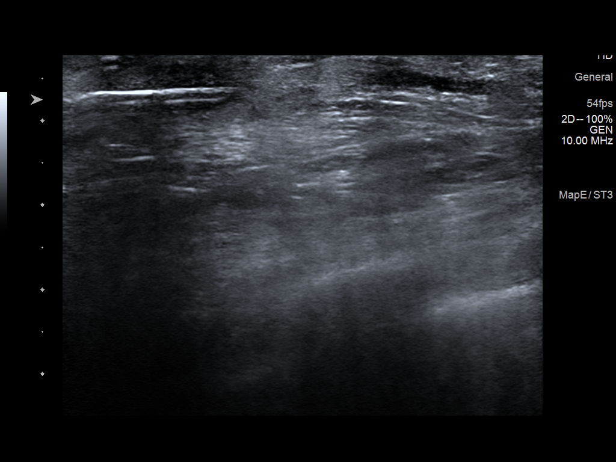
[im 24/24]
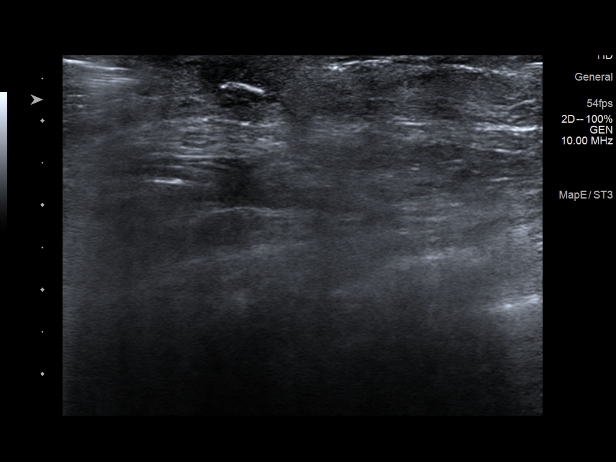

[15 of 24 positions shown; findings below may reference images not displayed]



SITE 1: RIGHT BREAST 8 O'CLOCK: MASS: Lesion quadrant: LOWER OUTER

Using sterile technique and 1% Lidocaine as local anesthetic, under
direct ultrasound visualization, a 14 gauge Perodua device was
used to perform biopsy of the mass in the right breast at the 8
o'clock position using a lateral to medial approach. At the
conclusion of the procedure a ribbon shaped tissue marker clip was
deployed into the biopsy cavity. Follow up 2 view mammogram was
performed and dictated separately.

SITE 2: RIGHT AXILLA SMALLER MASS: Lesion quadrant: UPPER-OUTER

Using sterile technique and 1% Lidocaine as local anesthetic, under
direct ultrasound visualization, a 14 gauge Perodua device was
used to perform biopsy of the smaller superficial mass in the right
axilla using a lateral to medial approach. At the conclusion of the
procedure a spiral shaped HydroMARK tissue marker clip was deployed
into the biopsy cavity. Follow up 2 view mammogram was performed and
dictated separately.

SITE 3: RIGHT AXILLA LARGER MASS: Lesion quadrant: UPPER-OUTER

Using sterile technique and 1% Lidocaine as local anesthetic, under
direct ultrasound visualization, a 14 gauge Perodua device was
used to perform biopsy of the larger superficial mass in the right
axilla using a lateral to medial approach. At the conclusion of the
procedure a spiral shaped HydroMARK tissue marker clip was deployed
into the biopsy cavity. Follow up 2 view mammogram was performed and
dictated separately.
IMPRESSION: 1. Ultrasound-guided biopsy of the mass in the right breast at the 8
o'clock position, at site of ribbon shaped biopsy marking clip.

2. Ultrasound-guided biopsy of the smaller superficial mass in the
right axilla, at site of HydroMARK biopsy marking clip.

3. Ultrasound-guided biopsy of the larger superficial mass in the
right axilla, at site of HydroMARK biopsy marking clip.

ADDENDUM:
Pathology revealed FAT NECROSIS WITH ASSOCIATED FIBROSIS of the
RIGHT breast, 8 o'clock. This was found to be concordant by Dr.
Ingu Jege.

Pathology revealed FAT NECROSIS WITH ASSOCIATED FIBROSIS of the
RIGHT axilla, smaller mass. This was found to be concordant by Dr.
Ingu Jege.

Pathology revealed FAT NECROSIS WITH ASSOCIATED FIBROSIS of the
RIGHT axilla, larger mass. This was found to be concordant by Dr.
Ingu Jege.

Pathology results were discussed with the patient by telephone. The
patient reported doing well after the biopsies with tenderness at
the sites. Post biopsy instructions and care were reviewed and
questions were answered. The patient was encouraged to call The

The patient was instructed to return for annual diagnostic
mammography and informed a reminder notice would be sent regarding
this appointment.

Pathology results reported by Sunny Boy Nadine, RN on 02/17/2019.



SITE 1: RIGHT BREAST 8 O'CLOCK: MASS: Lesion quadrant: LOWER OUTER

Using sterile technique and 1% Lidocaine as local anesthetic, under
direct ultrasound visualization, a 14 gauge Perodua device was
used to perform biopsy of the mass in the right breast at the 8
o'clock position using a lateral to medial approach. At the
conclusion of the procedure a ribbon shaped tissue marker clip was
deployed into the biopsy cavity. Follow up 2 view mammogram was
performed and dictated separately.

SITE 2: RIGHT AXILLA SMALLER MASS: Lesion quadrant: UPPER-OUTER

Using sterile technique and 1% Lidocaine as local anesthetic, under
direct ultrasound visualization, a 14 gauge Perodua device was
used to perform biopsy of the smaller superficial mass in the right
axilla using a lateral to medial approach. At the conclusion of the
procedure a spiral shaped HydroMARK tissue marker clip was deployed
into the biopsy cavity. Follow up 2 view mammogram was performed and
dictated separately.

SITE 3: RIGHT AXILLA LARGER MASS: Lesion quadrant: UPPER-OUTER

Using sterile technique and 1% Lidocaine as local anesthetic, under
direct ultrasound visualization, a 14 gauge Perodua device was
used to perform biopsy of the larger superficial mass in the right
axilla using a lateral to medial approach. At the conclusion of the
procedure a spiral shaped HydroMARK tissue marker clip was deployed
into the biopsy cavity. Follow up 2 view mammogram was performed and
dictated separately.
IMPRESSION: 1. Ultrasound-guided biopsy of the mass in the right breast at the 8
o'clock position, at site of ribbon shaped biopsy marking clip.

2. Ultrasound-guided biopsy of the smaller superficial mass in the
right axilla, at site of HydroMARK biopsy marking clip.

3. Ultrasound-guided biopsy of the larger superficial mass in the
right axilla, at site of HydroMARK biopsy marking clip.

## 2020-10-30 IMAGING — MG MM BREAST LOCALIZATION CLIP
6 series · 6 of 18 positions shown · non-contrast
Comparison: Previous exam(s).

CLINICAL DATA: Post ultrasound-guided core biopsy of a mass in the
right breast at the 8 o'clock position, ultrasound-guided core
biopsy of the smaller mass in the right axilla and ultrasound-guided
core biopsy of the larger mass in the right axilla.

EXAM:
DIAGNOSTIC RIGHT MAMMOGRAM POST ULTRASOUND BIOPSY

[R ML synth-2D]
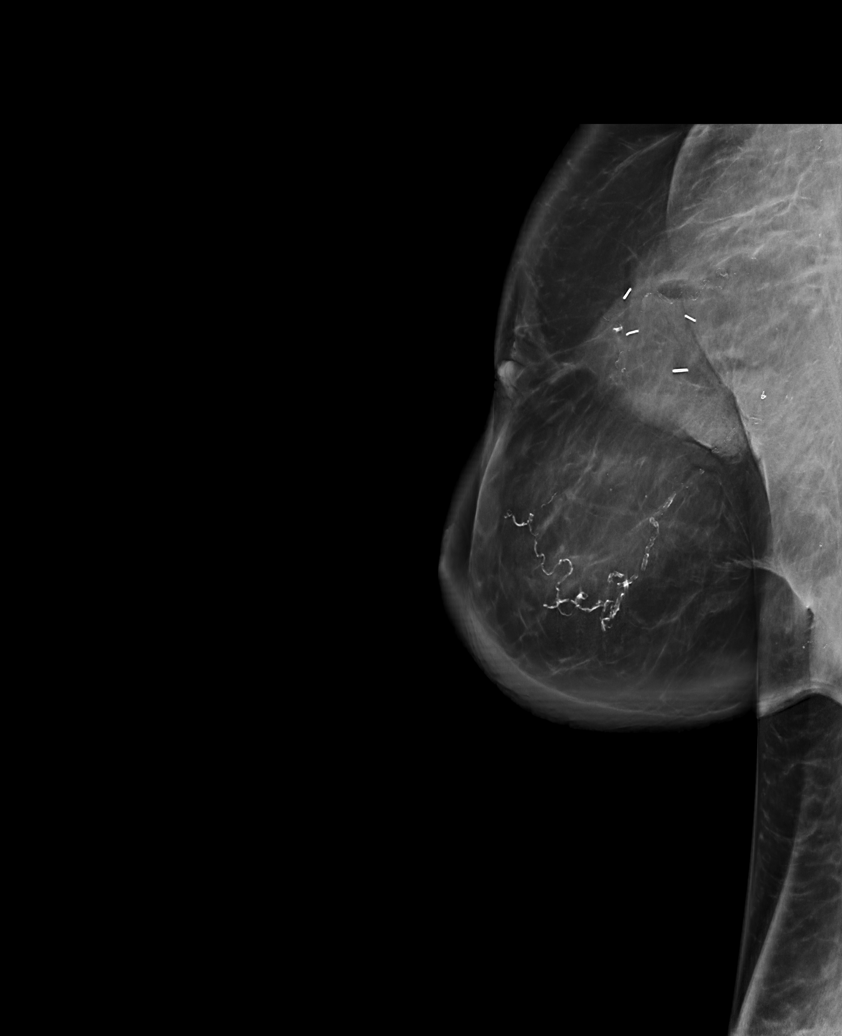

[R CC synth-2D]
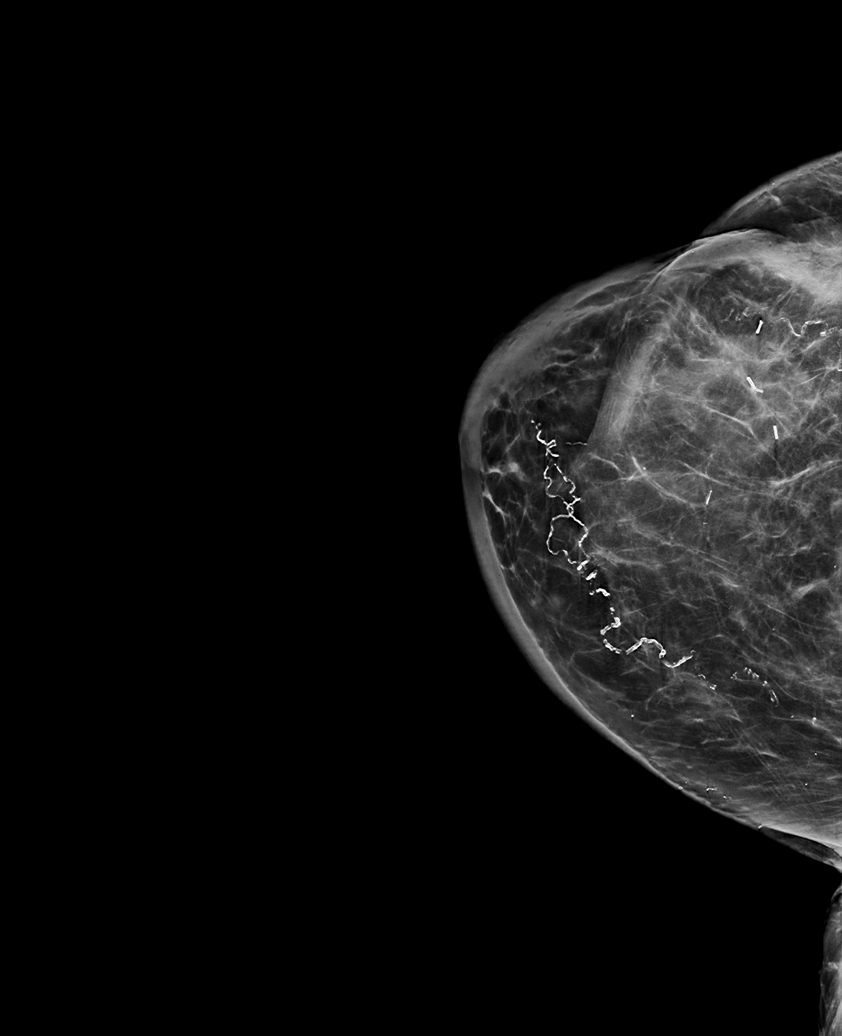

[R XCCL synth-2D]
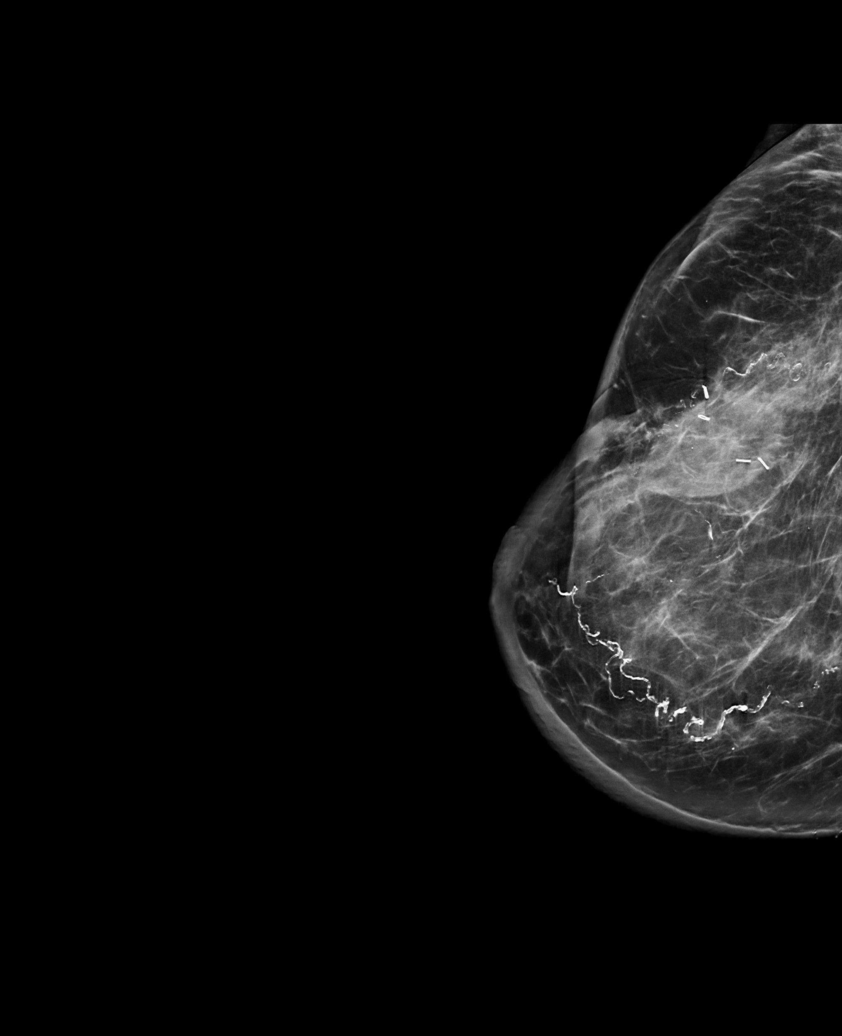

[R ML tomo · tomo slice 59/116.0]
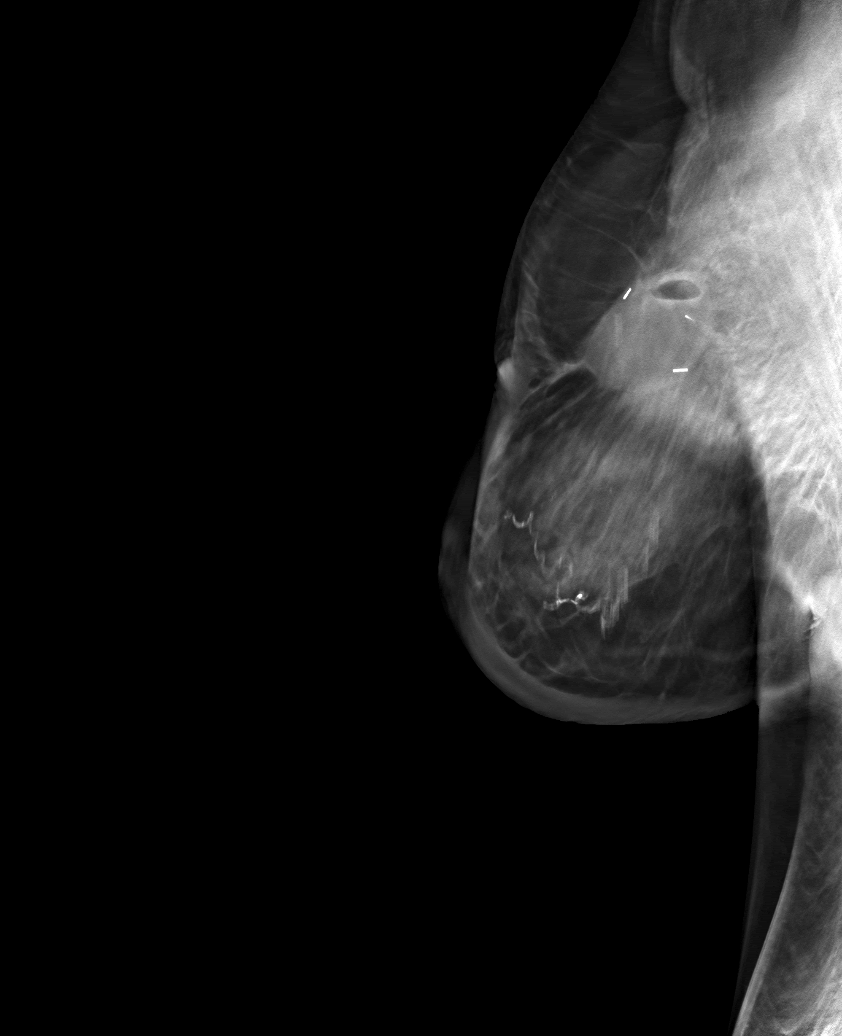

[R CC tomo · tomo slice 39/76.0]
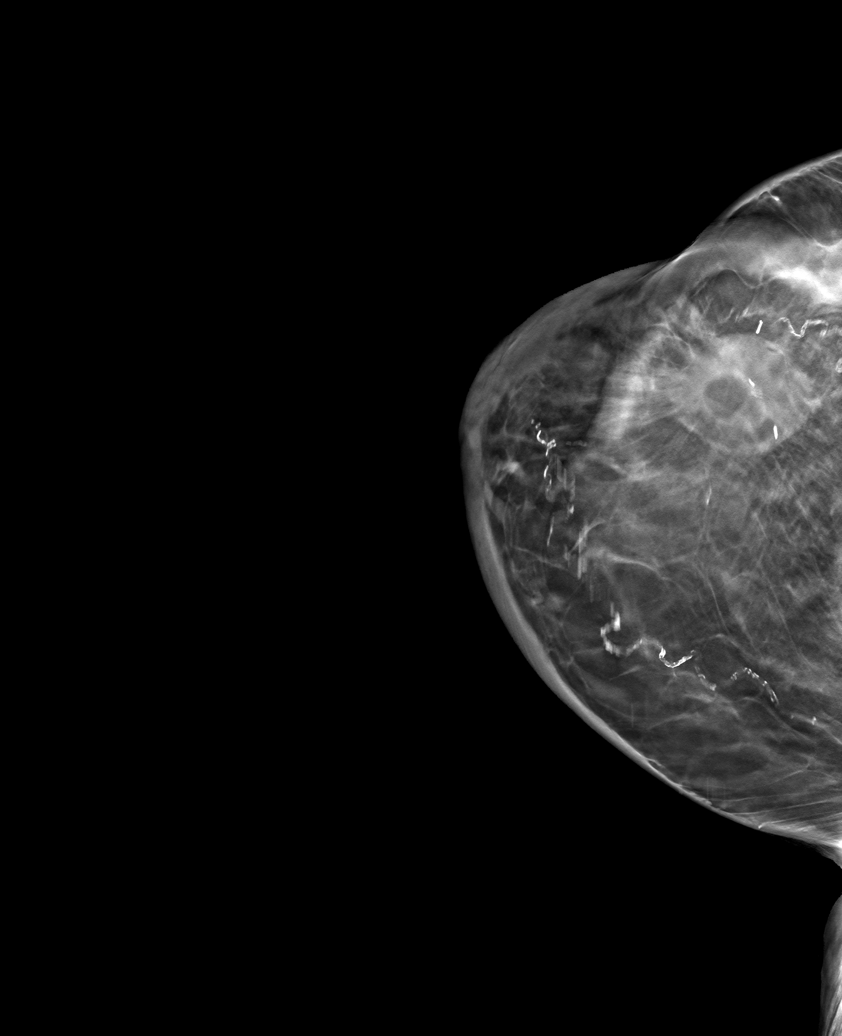

[R XCCL tomo · tomo slice 42/83.0]
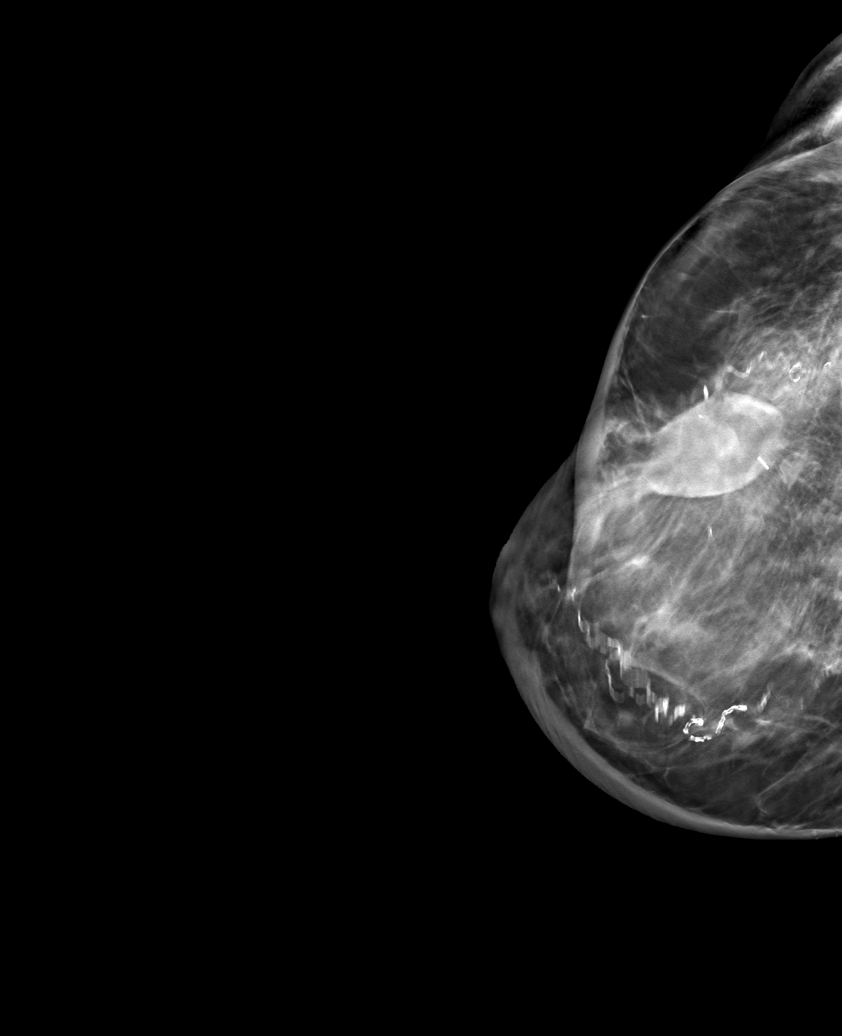

[6 of 18 positions shown; findings below may reference images not displayed]

FINDINGS: Mammographic images were obtained following ultrasound guided biopsy
of a mass in the right breast at the 8 o'clock position,
ultrasound-guided biopsy of the smaller mass in the right axilla and
ultrasound-guided biopsy of the larger mass in the right axilla.

A ribbon shaped biopsy marking clip is present at the site of the
biopsied mass in the right breast at the 8 o'clock position, seen on
the ML view only.

A HydroMARK biopsy marking clip is present at the site of 1 of the
biopsied masses in the right axilla, with the other HydroMARK biopsy
marking clip not included in the field of view.
IMPRESSION: 1. Ribbon shaped biopsy marking clip at site of biopsied mass in the
right breast at the 8 o'clock position, visualized on the ML view
only.

2. HydroMARK biopsy marking clip at site of 1 of the biopsied masses
in the right axilla, with the additional HydroMARK biopsy marking
clip not included in the field of view.

Final Assessment: Post Procedure Mammograms for Marker Placement

## 2020-11-04 IMAGING — US US ABDOMEN COMPLETE
1 series · 13 of 25 positions shown · non-contrast
Comparison: 06/10/2018 CT, chest x-ray from 01/27/2019

CLINICAL DATA: Anemia

EXAM:
ABDOMEN ULTRASOUND COMPLETE

[Series 1: us abdomen complete · 13 of 110 slices shown]
[im 1/110]
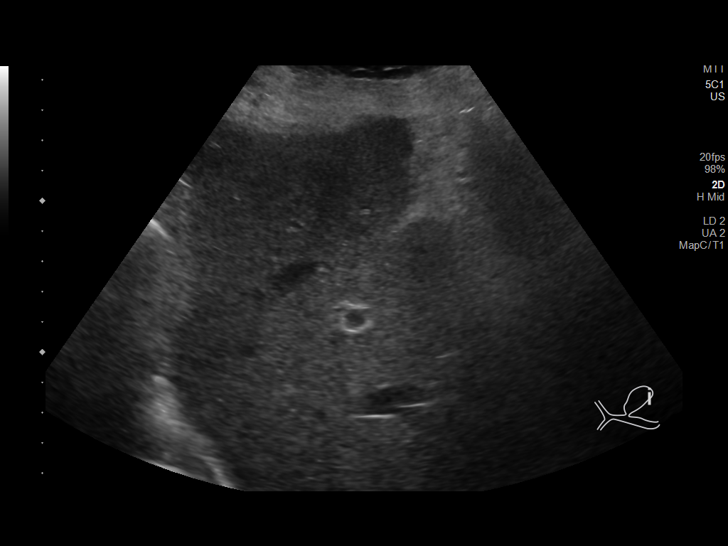
[im 10/110]
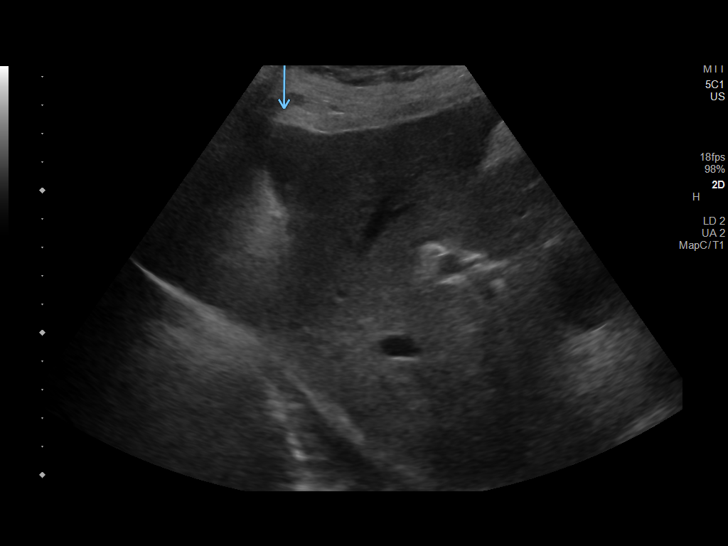
[im 19/110]
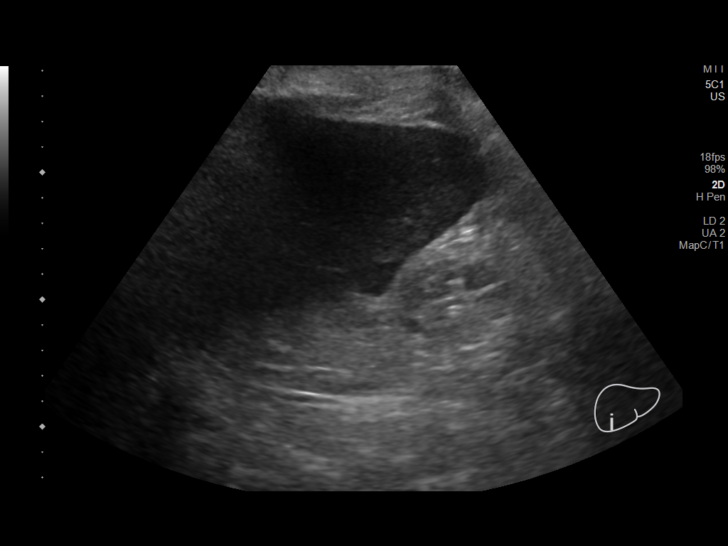
[im 28/110]
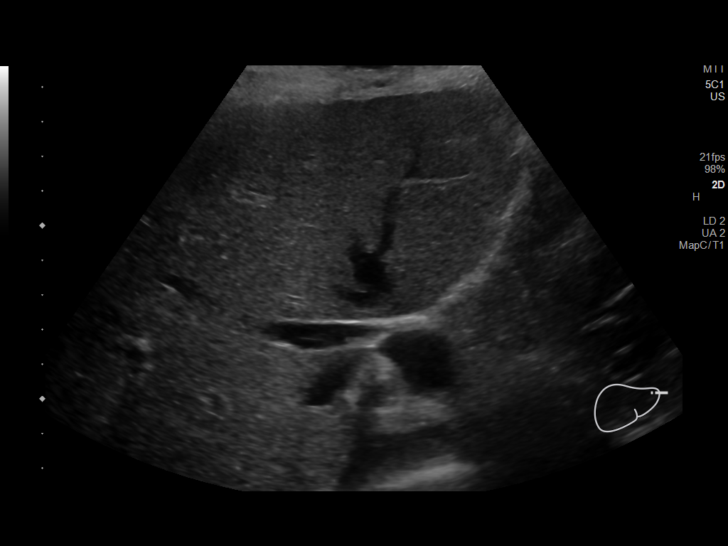
[im 37/110]
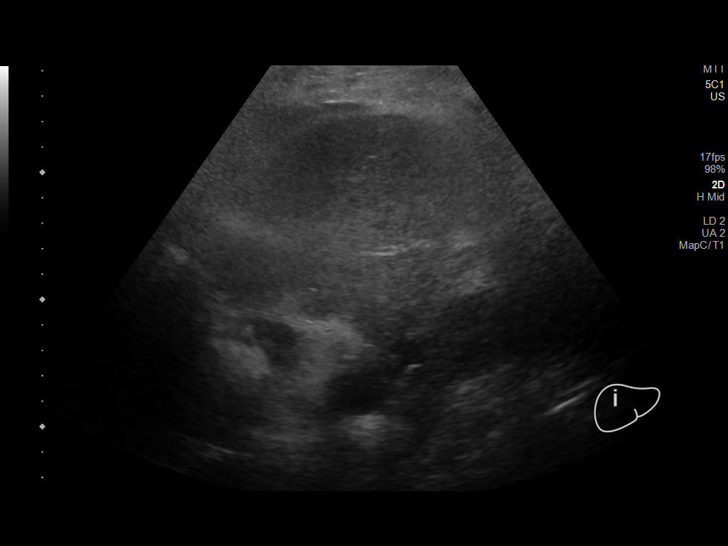
[im 46/110]
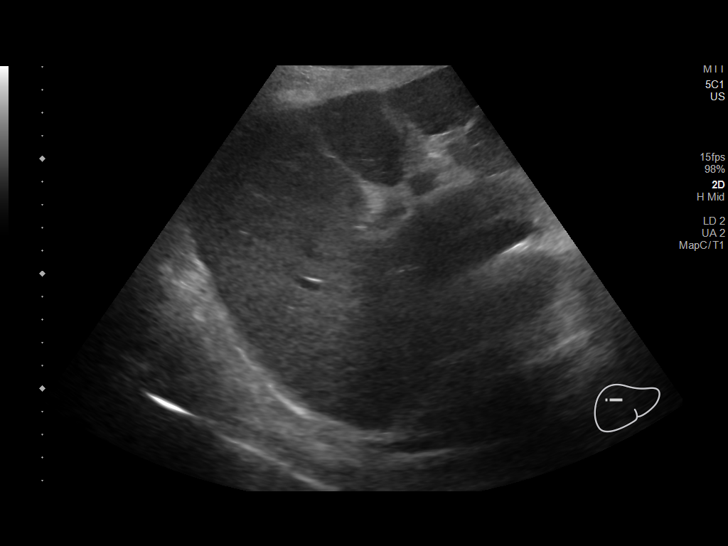
[im 55/110]
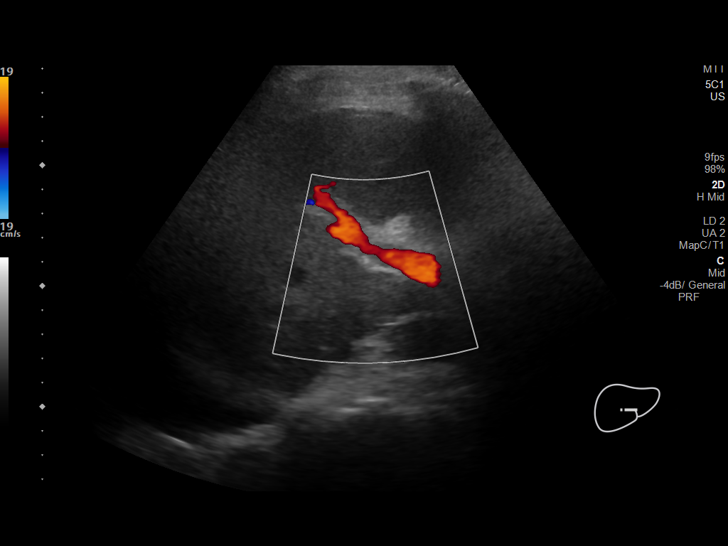
[im 64/110]
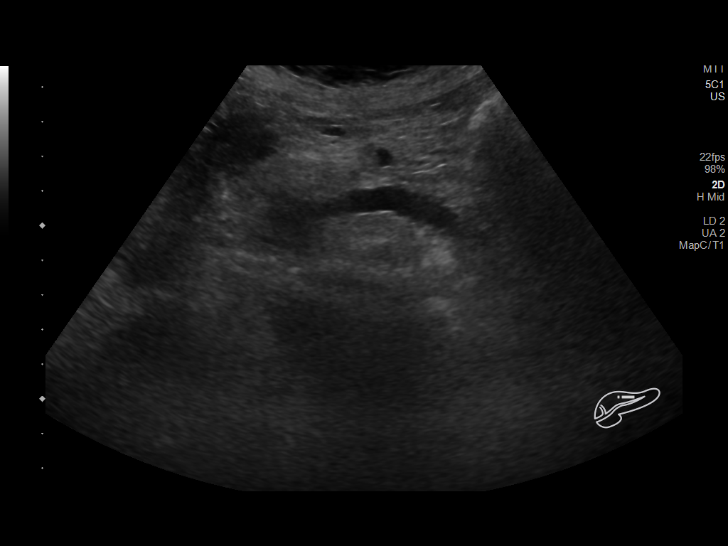
[im 73/110]
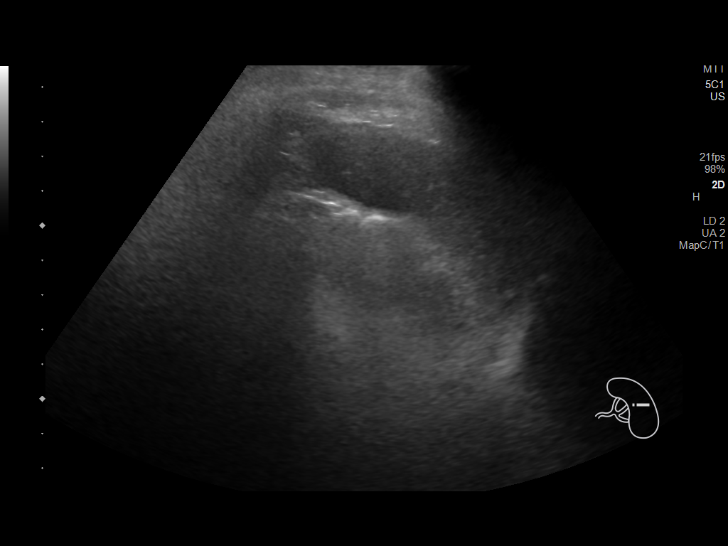
[im 82/110]
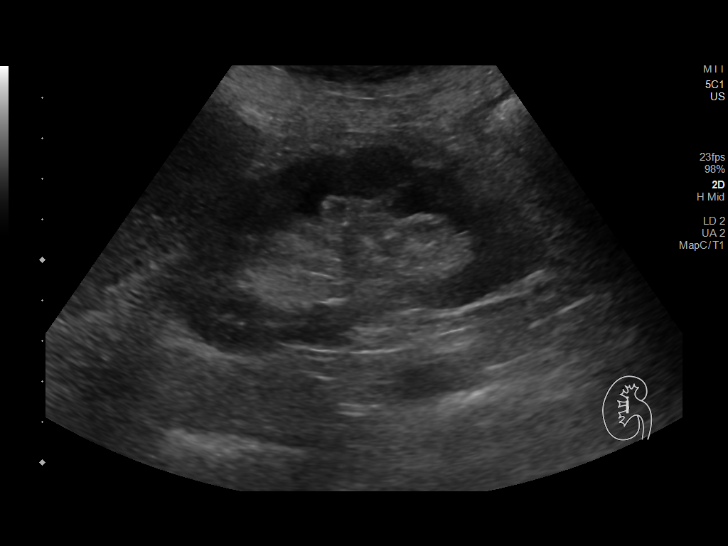
[im 91/110]
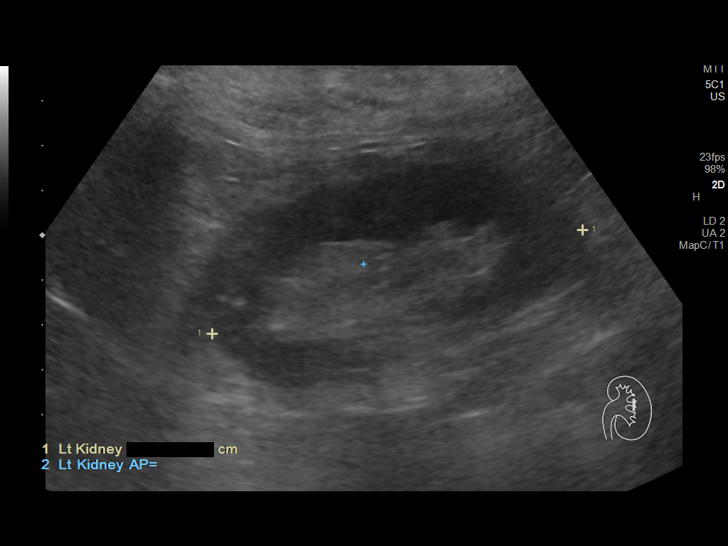
[im 100/110]
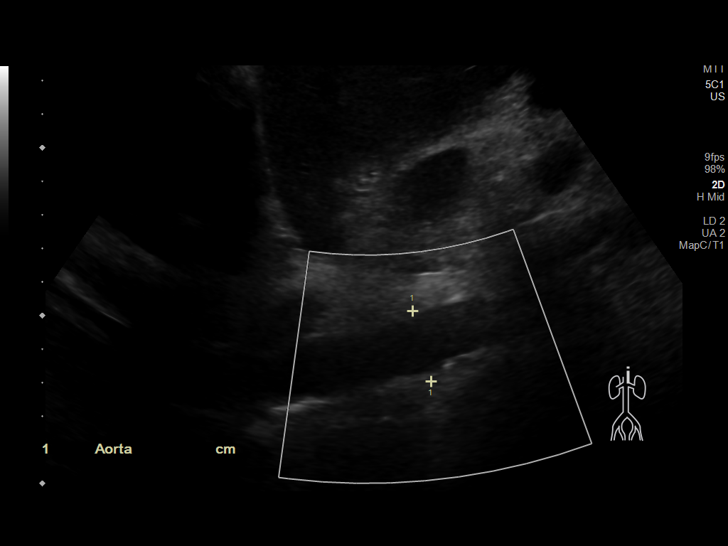
[im 110/110]
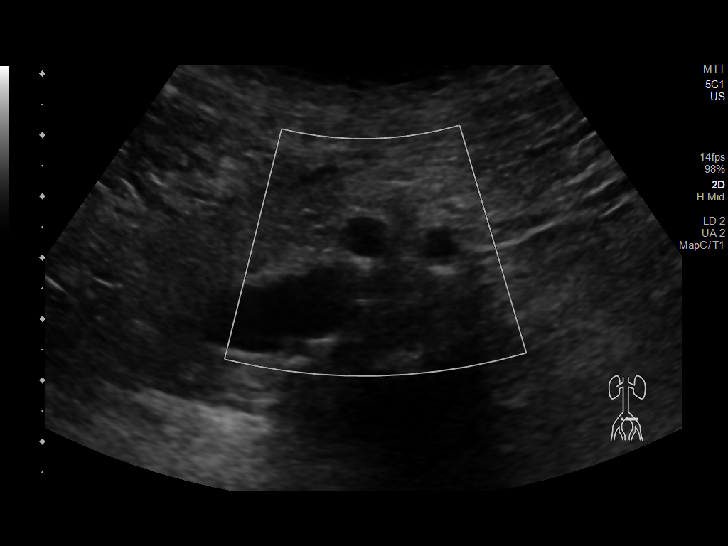

[13 of 25 positions shown; findings below may reference images not displayed]

FINDINGS: Gallbladder: Surgically removed

Common bile duct: Diameter: 4.4 mm.

Liver: Mild increased echogenicity is noted without focal mass. This
may be related to fatty infiltration or underlying hepatocellular
disease. Portal vein is patent on color Doppler imaging with normal
direction of blood flow towards the liver.

IVC: No abnormality visualized.

Pancreas: Visualized portion unremarkable.

Spleen: Size and appearance within normal limits.

Right Kidney: Length: 9.4 cm. Echogenicity within normal limits. No
mass or hydronephrosis visualized.

Left Kidney: Length: 0.6 cm. Echogenicity within normal limits. No
mass or hydronephrosis visualized.

Abdominal aorta: No aneurysm visualized.

Other findings: There is a somewhat lobulated fluid collection
identified along the inferior aspect of the liver. This measures
x 3.4 x 4.3 cm. This was not seen on the prior CT examination and
may be related to a focal fluid collection from prior
cholecystectomy. Mild perihepatic ascites is noted.

Note is made of right-sided pleural effusion.
IMPRESSION: Status post cholecystectomy. There is a lobulated cystic area as
described above along the inferior right hepatic margin. This could
be related to the prior surgery possibly representing a small
seroma. CT may be helpful for further evaluation.

Small right pleural effusion.

Mild ascites.

Increased echogenicity within the liver likely related to fatty
infiltration or underlying hepatocellular disease.

## 2020-11-06 IMAGING — CR DG CHEST 2V
2 series · 2 of 2 positions shown · non-contrast
Comparison: 01/27/2019

CLINICAL DATA: Shortness of breath for 2 weeks

EXAM:
CHEST - 2 VIEW

[w chest pa]
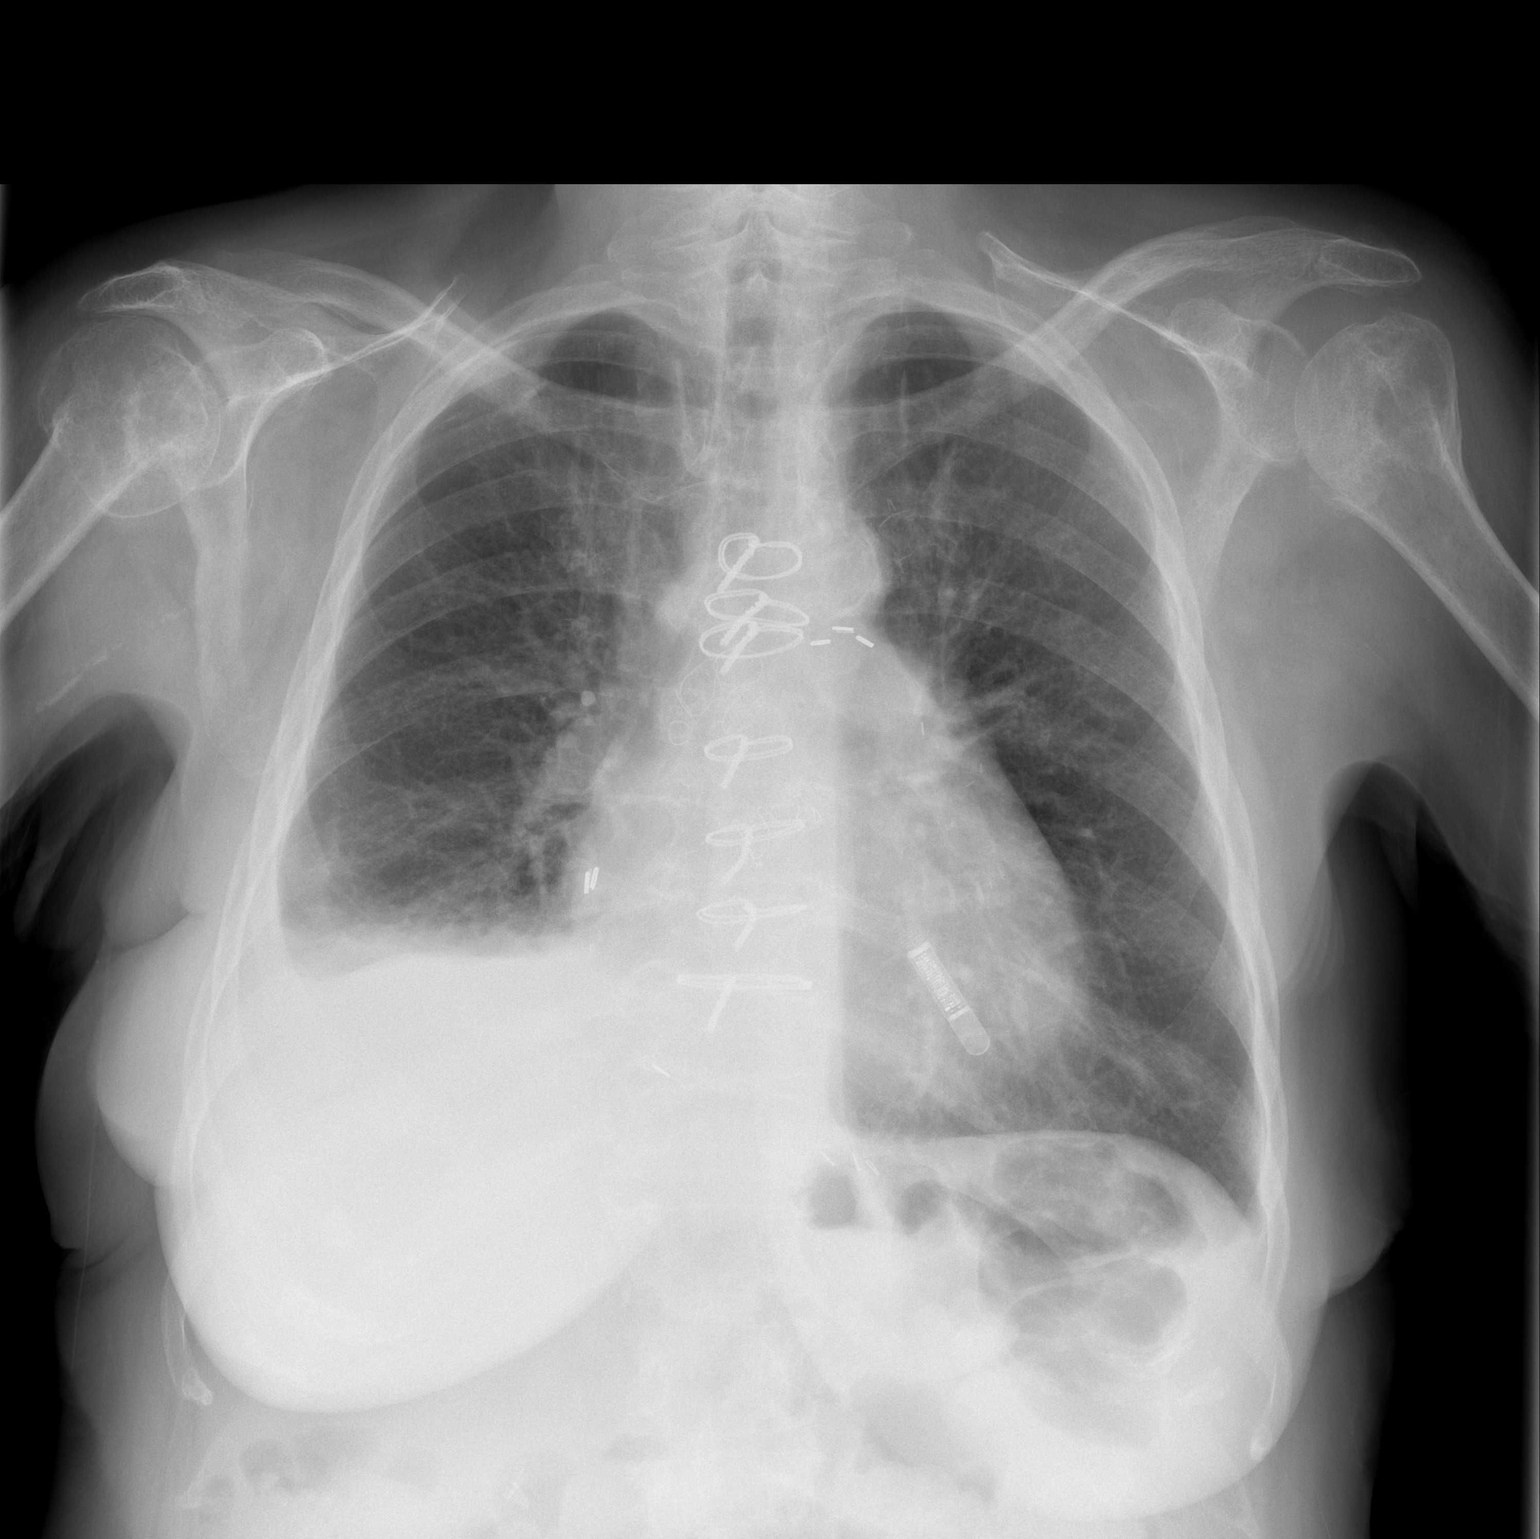

[w chest lat]
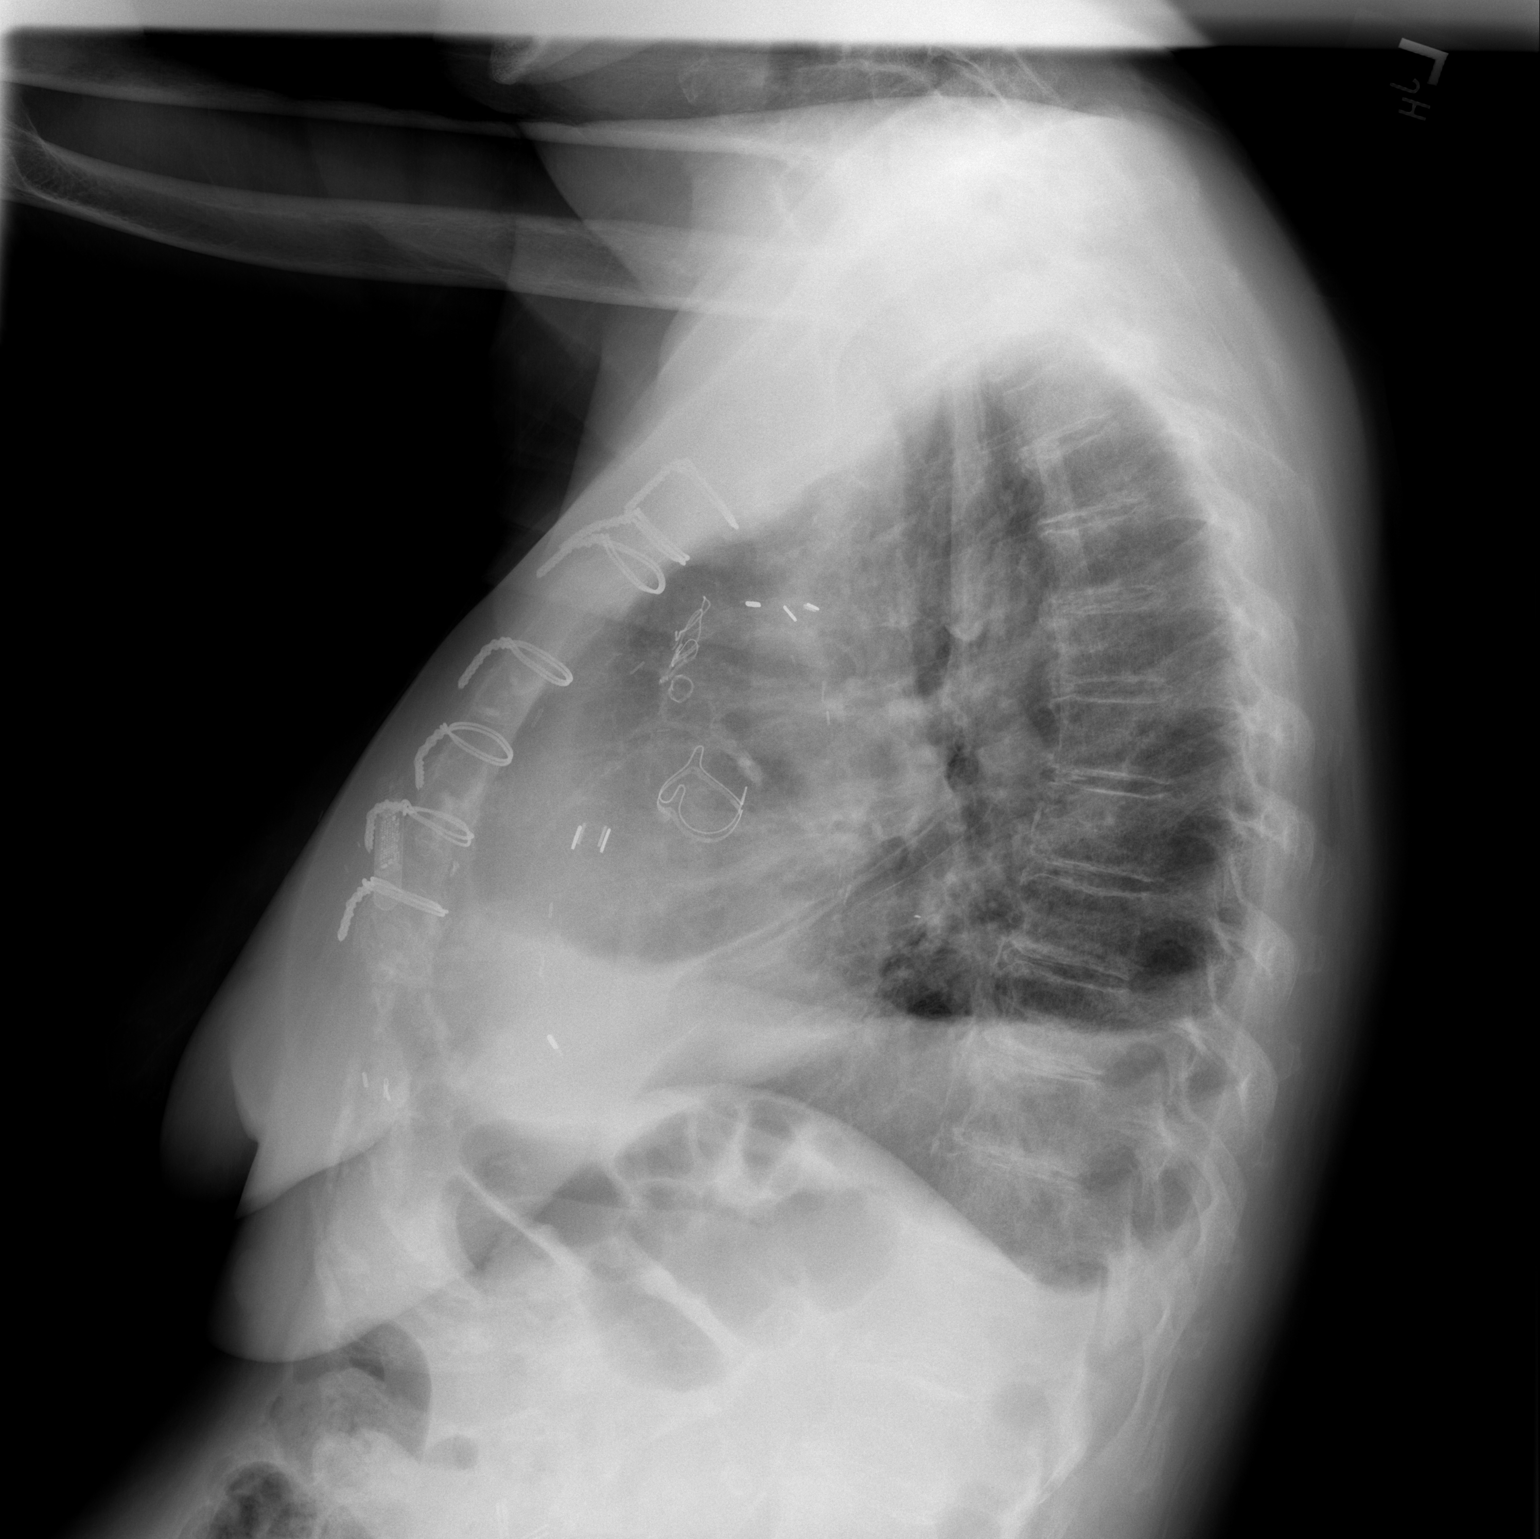

[2 of 2 positions shown; findings below may reference images not displayed]

FINDINGS: Cardiac shadow is stable in appearance. Postsurgical changes are
again noted. Loop recorder is again seen. Slight increase in
right-sided pleural effusion with underlying atelectasis is noted.
IMPRESSION: Increasing right-sided pleural effusion with underlying atelectasis.

## 2020-11-07 IMAGING — CR DG CHEST 2V
2 series · 2 of 2 positions shown · non-contrast
Comparison: 02/19/2019

CLINICAL DATA: Pleural effusion follow-up

EXAM:
CHEST - 2 VIEW

[chest pa]
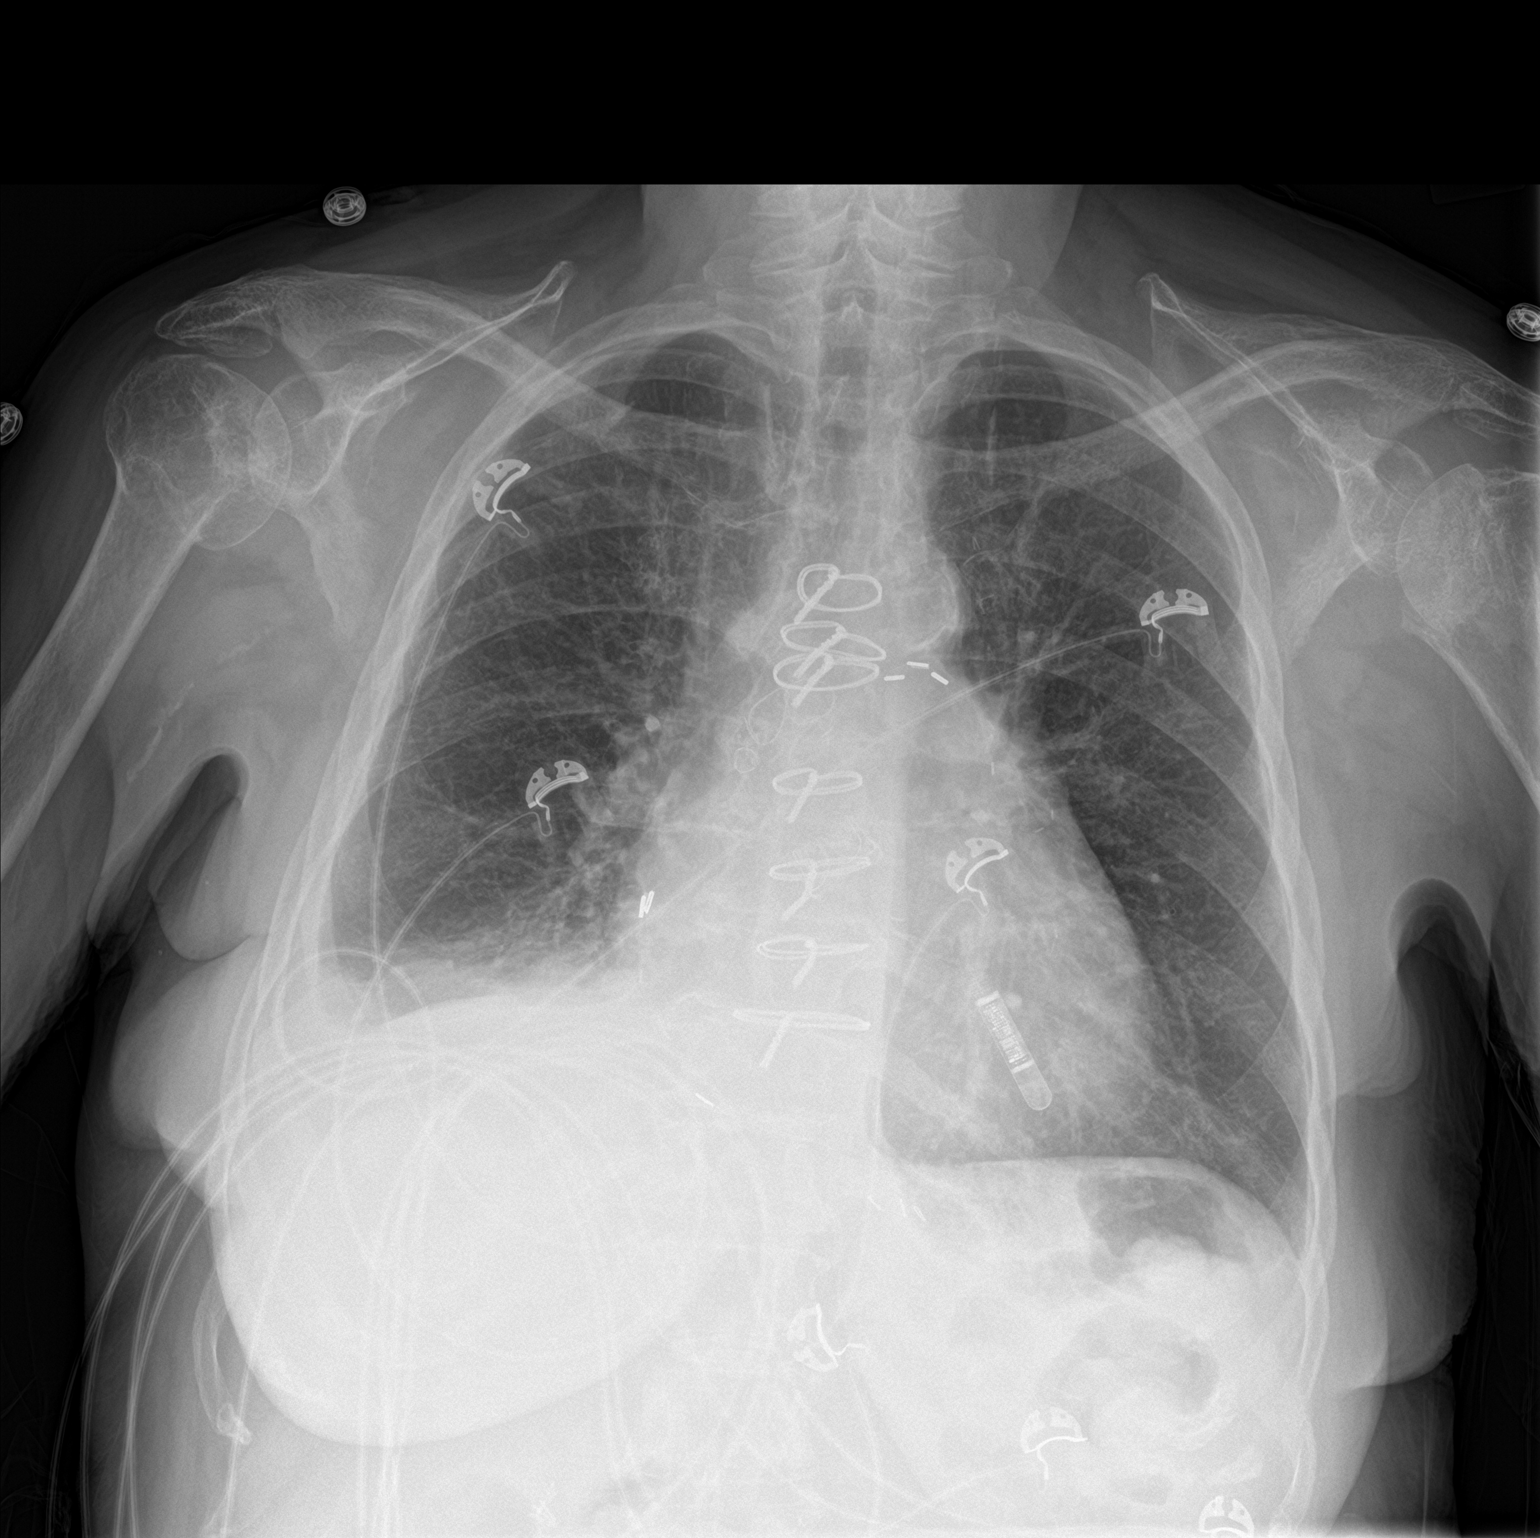

[chest lat]
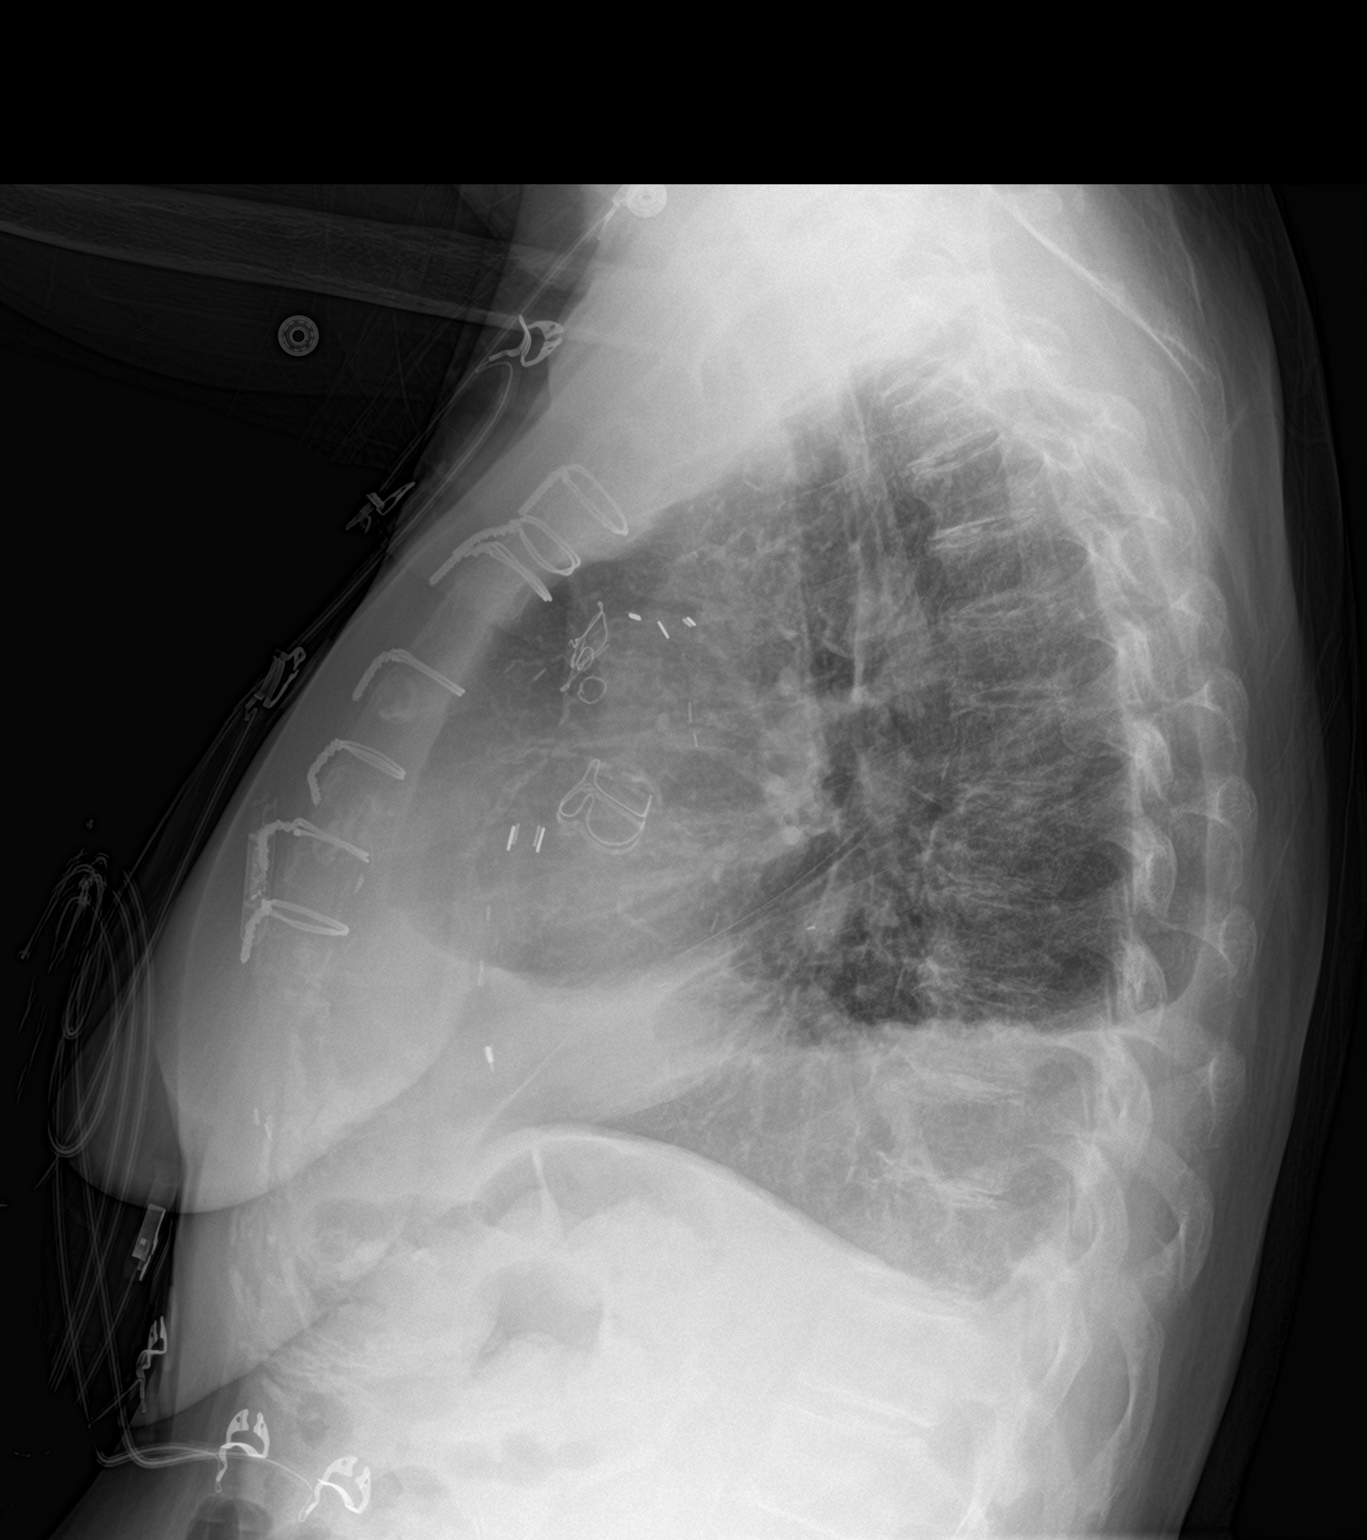

[2 of 2 positions shown; findings below may reference images not displayed]

FINDINGS: Small right effusion and right lower lobe airspace disease unchanged

Cardiac enlargement without heart failure or edema. Cardiac loop
recorder. CABG. Left lung clear.
IMPRESSION: Small right effusion and right lower lobe airspace disease
unchanged. Negative for edema.

## 2020-11-09 ENCOUNTER — Telehealth: Payer: Self-pay | Admitting: Hematology and Oncology

## 2020-11-09 ENCOUNTER — Encounter: Payer: Self-pay | Admitting: Hematology and Oncology

## 2020-11-09 NOTE — Telephone Encounter (Signed)
Scheduled appts per 6/1 sch msg. Pt aware. Pt requested appts on Fridays.

## 2020-11-10 ENCOUNTER — Other Ambulatory Visit: Payer: Self-pay

## 2020-11-10 MED ORDER — EZETIMIBE 10 MG PO TABS: 10.0000 mg | ORAL_TABLET | Freq: Every day | ORAL | 3 refills | Status: AC

## 2020-11-11 ENCOUNTER — Ambulatory Visit (HOSPITAL_COMMUNITY)
Admission: RE | Admit: 2020-11-11 | Discharge: 2020-11-11 | Disposition: A | Discharge status: Home / Self Care | Payer: Medicare Other | Source: Ambulatory Visit | Attending: Cardiology | Admitting: Cardiology

## 2020-11-11 ENCOUNTER — Encounter (HOSPITAL_COMMUNITY): Payer: Self-pay | Admitting: Cardiology

## 2020-11-11 ENCOUNTER — Other Ambulatory Visit: Payer: Self-pay

## 2020-11-11 VITALS — BP 112/54 | HR 79 | Ht 64.0 in | Wt 140.8 lb

## 2020-11-11 DIAGNOSIS — I2510 Atherosclerotic heart disease of native coronary artery without angina pectoris: Secondary | ICD-10-CM | POA: Diagnosis not present

## 2020-11-11 DIAGNOSIS — I5032 Chronic diastolic (congestive) heart failure: Secondary | ICD-10-CM | POA: Insufficient documentation

## 2020-11-11 DIAGNOSIS — Z951 Presence of aortocoronary bypass graft: Secondary | ICD-10-CM | POA: Insufficient documentation

## 2020-11-11 DIAGNOSIS — I480 Paroxysmal atrial fibrillation: Secondary | ICD-10-CM | POA: Diagnosis not present

## 2020-11-11 DIAGNOSIS — Z794 Long term (current) use of insulin: Secondary | ICD-10-CM | POA: Diagnosis not present

## 2020-11-11 DIAGNOSIS — E785 Hyperlipidemia, unspecified: Secondary | ICD-10-CM | POA: Insufficient documentation

## 2020-11-11 DIAGNOSIS — I130 Hypertensive heart and chronic kidney disease with heart failure and stage 1 through stage 4 chronic kidney disease, or unspecified chronic kidney disease: Secondary | ICD-10-CM | POA: Insufficient documentation

## 2020-11-11 DIAGNOSIS — Z79899 Other long term (current) drug therapy: Secondary | ICD-10-CM | POA: Insufficient documentation

## 2020-11-11 DIAGNOSIS — Z953 Presence of xenogenic heart valve: Secondary | ICD-10-CM | POA: Diagnosis not present

## 2020-11-11 DIAGNOSIS — Z7901 Long term (current) use of anticoagulants: Secondary | ICD-10-CM | POA: Insufficient documentation

## 2020-11-11 DIAGNOSIS — I509 Heart failure, unspecified: Secondary | ICD-10-CM

## 2020-11-11 DIAGNOSIS — N183 Chronic kidney disease, stage 3 unspecified: Secondary | ICD-10-CM | POA: Diagnosis not present

## 2020-11-11 DIAGNOSIS — E039 Hypothyroidism, unspecified: Secondary | ICD-10-CM

## 2020-11-11 DIAGNOSIS — R55 Syncope and collapse: Secondary | ICD-10-CM | POA: Diagnosis not present

## 2020-11-11 DIAGNOSIS — Z950 Presence of cardiac pacemaker: Secondary | ICD-10-CM | POA: Insufficient documentation

## 2020-11-11 DIAGNOSIS — I495 Sick sinus syndrome: Secondary | ICD-10-CM | POA: Diagnosis not present

## 2020-11-11 DIAGNOSIS — Z7951 Long term (current) use of inhaled steroids: Secondary | ICD-10-CM | POA: Insufficient documentation

## 2020-11-11 LAB — COMPREHENSIVE METABOLIC PANEL
ALT: 41 U/L (ref 0–44)
AST: 51 U/L — ABNORMAL HIGH (ref 15–41)
Albumin: 2.9 g/dL — ABNORMAL LOW (ref 3.5–5.0)
Alkaline Phosphatase: 61 U/L (ref 38–126)
Anion gap: 4 — ABNORMAL LOW (ref 5–15)
BUN: 40 mg/dL — ABNORMAL HIGH (ref 8–23)
CO2: 28 mmol/L (ref 22–32)
Calcium: 9.3 mg/dL (ref 8.9–10.3)
Chloride: 108 mmol/L (ref 98–111)
Creatinine, Ser: 2.3 mg/dL — ABNORMAL HIGH (ref 0.44–1.00)
GFR, Estimated: 22 mL/min — ABNORMAL LOW (ref >60)
Glucose, Bld: 218 mg/dL — ABNORMAL HIGH (ref 70–99)
Potassium: 4.5 mmol/L (ref 3.5–5.1)
Sodium: 140 mmol/L (ref 135–145)
Total Bilirubin: 0.7 mg/dL (ref 0.3–1.2)
Total Protein: 6.1 g/dL — ABNORMAL LOW (ref 6.5–8.1)

## 2020-11-11 LAB — TSH: TSH: 2.976 u[IU]/mL (ref 0.350–4.500)

## 2020-11-11 NOTE — Patient Instructions (Signed)
Labs done today, your results will be available in MyChart, we will contact you for abnormal readings.   EKG done  Your physician recommends that you schedule a follow-up appointment in: 6 months Call our office in November for an appointment  If you have any questions or concerns before your next appointment please send [place] a message through [place] or call our office at [phone].    TO LEAVE A MESSAGE FOR THE NURSE SELECT OPTION 2, PLEASE LEAVE A MESSAGE INCLUDING: . YOUR NAME . DATE OF BIRTH . CALL BACK NUMBER . REASON FOR CALL**this is important as we prioritize the call backs  [place] AS LONG AS YOU CALL BEFORE 4:00 PM  At the [facility], you and your health needs are our priority. As part of our continuing mission to provide you with exceptional heart care, we have created designated Provider Care Teams. These Care Teams include your primary Cardiologist (physician) and Advanced Practice Providers (APPs- Physician Assistants and Nurse Practitioners) who all work together to provide you with the care you need, when you need it.   You may see any of the following providers on your designated Care Team at your next follow up: Marland Kitchen Dr Glori Bickers . Dr Loralie Champagne . Dr Vickki Muff . Darrick Grinder, NP . Lyda Jester, [place] . Audry Riles, PharmD   Please be sure to bring in all your medications bottles to every appointment.

## 2020-11-12 NOTE — Progress Notes (Signed)
Patient ID: Hannah [name], female   DOB: 08-16-1945, 75 y.o.   MRN: [id number] PCP: Dr. Suzy Bouchard Cardiology: Dr. Aundra Dubin  75 y.o. with history of CAD s/p CABG, bioprosthetic AVR, CKD, paroxysmal atrial fibrillation presents for cardiology followup.  She had her initial CABG in 1998, then had redo CABG in 2009 with bioprosthetic AVR.    In 9/14, she developed tachypalpitations and later chest pain.  In the ER, SBP was in the 200s with elevated HR. Troponin was mildly elevated.  Therefore, she had a cardiac cath showing patent grafts.  She was thought to have had demand ischemia in the setting of hypertensive emergency + tachycardia.  After discharge, she continued to have runs of tachypalpitations.  A 3 week event monitor was put on her.  This showed runs, sometimes long, of atrial tachycardia. She was started on Toprol XL when these runs were noted.  No further palpitations (complete resolution of symptoms).    In 1/15, she was admitted to [facility] with atypical chest pain.  She was ruled out for MI and Lexiscan Cardiolite showed no ischemia.  She was started on Imdur and sent home.  After discharge, she developed a daily headache and nausea.  She did not eat or drink much due to the nausea. Shortly after this, she was standing in her grandson's pediatrician's office and became severely lightheaded.  She had to sit down.  She came to the ER and was found to be orthostatic.  She was given IV fluid and got better quickly.  I had her stop Imdur and symptoms seem to have completely resolved.    Event monitor in 8/16 showed atrial tachycardia versus atypical atrial flutter.  Toprol XL was increased.  She saw Dr Lovena Le who thought that she had short runs of atrial tachycardia and not atrial flutter.  Subsequently, she has been found to have paroxysmal atrial fibrillation and is on Eliquis.     Last echo in 11/21 showed EF 55-60%, mild LVH, normal RV, normal bioprosthetic aortic valve.  Cardiolite in 11/21 showed  no ischemia/infarction.   She has problems with anemia and has had multiple transfusions.  However, it has been a number of months since she required transfusion. She is getting Aranesp.   She has had significant problems with orthostatic intolerance and syncope.  She is taking midodrine now.  Last syncopal episode was in 12/21.  She had a loop recorder placed which has recently shown HR to 30s at times when she has symptomatic lightheadedness.  Biotronik PPM placed.   PYP scan in 3/22 was not suggestive of TTR amyloidosis.  Echo in 4/22 showed EF 60-65%, mild LVH, s/p TAVR with mean gradient 11 mmHg.   She returns for followup of diastolic CHF, paroxysmal atrial fibrillation.  She is doing better since getting her PPM. No episodes of lightheadedness now.  Strength is better though she still tires easily.  Eating better and weight is up.  No recent Lasix use .  Occasional atypical chest pain.   ECG: a-paced, LAFB (personally reviewed)  Labs (2/13): BNP 80, K 5.3, creatinine 1.1 Labs (6/13): LDL 76, HDL 41 Labs (8/13): K 4.3, creatinine 1.38 Labs (9/13): creatinine 1.6, proBNP 128 Labs (9/14): K 4.7, creatinine 1.5, LDL 82, HDL 35 Labs (10/14): K 4.8, creatinine 1.5 Labs (1/15): LDL 105, HDL 37 Labs (2/15): K 4.1, creatinine 1.28, HCT 34 Labs (4/15): K 4.2, creatinine 1.5 => 1.4, BNP 95 Labs (1/16): K 3.8, creatinine 1.6, Hgb 10.5 Labs (7/16):  K 3.7, creatinine 1.77, LDL 49, HDL 27 Labs (1/17): LDL 58, HDL 38, HCT 35.4 Labs (4/17): K 4.5, creatinine 1.47 Labs (1/22): K 4.3, creatinine 2.03, hgb 9.7, BNP 318 Labs (2/22): K 4.8, creatinine 2.83, hgb 10.4, AST 84, ALT 117 Labs (5/22): K 4.5, creatinine 2.4  PMH: 1. Diabetes mellitus 2. HTN 3. Obesity 4. Atrial arrhythmias: Atrial tachycardia and paroxysmal atrial fibrillation.    5. CAD: CABG 1998 with LIMA-LAD/diagonal, sequential SVG-OM1/OM2, SVG-RCA.  Redo CABG 2009 with SVG-OM3, SVG-PDA. Lexiscan myoview (9/13): EF 61%, no ischemia  or infarction.  LHC (10/14) with all grafts patent except the old SVG-RCA.  There was 90% stenosis in the AV LCx before ungrafted MOM, but this was unchanged from prior study.  Lexiscan Cardiolite (1/15) with no ischemia. She does not tolerate Imdur.  - Cardiolite (11/21): EF 61%, no ischemia/infarction.  6. CKD 7. Bioprosthetic aortic valve: Echo (2011) with EF 50-55%, bioprosthetic aortic valve well-seated.  Echo (2/13) with EF 60-65%, bioprosthetic aortic valve looked ok.  Echo (9/14): EF 50-55%, mild LVH, bioprosthetic AVR with mean gradient 10, mild MR, moderate TR.  - Echo in 11/21 with stable bioprosthetic aortic valve.  8. Asthma: since her teens. 9. Low back pain.  10. 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  Per neurologist, did not appear cardioembolic.   11. Carotid stenosis: 40-59% bilateral ICA stenosis on 10/14 carotid dopplers. Carotid doppler (11/15) with 40-59% bilateral ICA stenosis.  Carotid dopplers (11/16) with 40-59% BICA stenosis.  - Carotid dopplers (8/21): 1-39% BICA stenosis.  12. Sleep study 12/14 without significant OSA.  13. Chronic diastolic CHF - Echo (97/41): EF 55-60%, mild LVH, RV normal, bioprosthetic aortic valve with mean gradient 8 mmHg and no AI.  - PYP scan (3/22): Negative, H/CL 1.1, grade 1.  - Echo (4/22): EF 60-65%, mild LVH, s/p TAVR with mean gradient 11 mmHg.  14. Breast cancer: Diagnosed 2017.  She had chemotherapy and right lumpectomy.  15. Anemia of renal disease 16. Tachy-brady syndrome: Symptomatic bradycardia.  - Biotronik PPM.  17. COVID-19 infection 2/21  SH: Never smoked.  Lives in [place].  Married.    FH: No premature CAD.   ROS: All systems reviewed and negative except as per HPI.    Current Outpatient Medications  Medication Sig Dispense Refill  . Accu-Chek FastClix Lancets MISC Use to check blood sugar 3 times per day 100 each 5  . ACCU-CHEK GUIDE test strip Use to test blood sugar 3 times daily 150 strip 3  .  acetaminophen (TYLENOL) 500 MG tablet Take 1,000 mg by mouth as needed for moderate pain.    Marland Kitchen albuterol (PROVENTIL) (2.5 MG/3ML) 0.083% nebulizer solution Take 3 mLs (2.5 mg total) by nebulization every 6 (six) hours as needed for wheezing or shortness of breath. 75 mL 3  . albuterol (VENTOLIN HFA) 108 (90 Base) MCG/ACT inhaler Inhale 2 puffs into the lungs every 6 (six) hours as needed for wheezing or shortness of breath. 6.7 g 10  . amiodarone (PACERONE) 200 MG tablet TAKE ONE TABLET BY MOUTH DAILY 90 tablet 3  . Blood Glucose Monitoring Suppl (ACCU-CHEK GUIDE ME) w/Device KIT Use accu chek meter to check blood sugar three times daily. 1 kit 0  . cetirizine (ZYRTEC) 10 MG tablet Take 10 mg by mouth daily.    . Cholecalciferol (DIALYVITE VITAMIN D 5000) 125 MCG (5000 UT) capsule Take 5,000 Units by mouth daily.    Marland Kitchen ELIQUIS 2.5 MG TABS tablet Take  1 tablet (2.5 mg total) by mouth 2 (two) times daily. 180 tablet 1  . exemestane (AROMASIN) 25 MG tablet TAKE ONE TABLET BY MOUTH DAILY AFTER BREAKFAST 90 tablet 3  . ezetimibe (ZETIA) 10 MG tablet Take 1 tablet (10 mg total) by mouth daily. 90 tablet 3  . famotidine (PEPCID) 20 MG tablet TAKE ONE TABLET BY MOUTH TWICE DAILY. ONE AFTER SUPPER 60 tablet 11  . furosemide (LASIX) 20 MG tablet Take 20 mg by mouth daily as needed for fluid.    Marland Kitchen HUMALOG KWIKPEN 100 UNIT/ML KwikPen INJECT SUBCUTANEOUSLY 4 TO  6 UNITS 3 TIMES DAILY  BEFORE MEALS (Patient taking differently: Inject 6-8 Units into the skin 3 (three) times daily.) 30 mL 3  . insulin glargine (LANTUS) 100 UNIT/ML injection Inject 20 Units into the skin daily.     . Insulin Pen Needle (BD PEN NEEDLE NANO U/F) 32G X 4 MM MISC USE AS INSTRUCTED TO INJECT INSULIN 4 TIMES DAILY. 360 each 1  . LANTUS SOLOSTAR 100 UNIT/ML Solostar Pen INJECT SUBCUTANEOUSLY 16  UNITS DAILY IN THE MORNING 15 mL 3  . metoprolol succinate (TOPROL-XL) 25 MG 24 hr tablet Take 0.5 tablets (12.5 mg total) by mouth daily. 45 tablet  3  . midodrine (PROAMATINE) 5 MG tablet Take 1 tablet (5 mg total) by mouth 3 (three) times daily with meals. 270 tablet 3  . ondansetron (ZOFRAN ODT) 4 MG disintegrating tablet Take 1 tablet (4 mg total) by mouth every 8 (eight) hours as needed. 10 tablet 0  . rosuvastatin (CRESTOR) 40 MG tablet Take 1 tablet (40 mg total) by mouth at bedtime. 90 tablet 3  . SYMBICORT 160-4.5 MCG/ACT inhaler INHALE TWO PUFFS INTO THE LUNGS TWICE DAILY 10.2 g 1  . TRULICITY 1.5 BS/9.6GE SOPN INJECT THE CONTENTS OF ONE  PEN SUBCUTANEOUSLY WEEKLY  AS DIRECTED 6 mL 3  . vitamin B-12 (CYANOCOBALAMIN) 100 MCG tablet Take 100 mcg by mouth daily.    Marland Kitchen zolpidem (AMBIEN) 5 MG tablet Take 7.5 mg by mouth at bedtime as needed for sleep.    . nitroGLYCERIN (NITROSTAT) 0.4 MG SL tablet Place 1 tablet (0.4 mg total) under the tongue every 5 (five) minutes as needed for chest pain. 25 tablet 3   No current facility-administered medications for this encounter.    BP (!) 112/54   Pulse 79   Ht 5' 4"  (1.626 m)   Wt 63.9 kg (140 lb 12.8 oz)   LMP  (LMP Unknown)   SpO2 100%   BMI 24.17 kg/m  General: NAD Neck: No JVD, no thyromegaly or thyroid nodule.  Lungs: Clear to auscultation bilaterally with normal respiratory effort. CV: Nondisplaced PMI.  Heart regular S1/S2, no S3/S4, 2/6 SEM RUSB.  Trace ankle edema.  No carotid bruit.  Normal pedal pulses.  Abdomen: Soft, nontender, no hepatosplenomegaly, no distention.  Skin: Intact without lesions or rashes.  Neurologic: Alert and oriented x 3.  Psych: Normal affect. Extremities: No clubbing or cyanosis.  HEENT: Normal.   Assessment/Plan: 1. CAD: Admission in 9/14 with mild troponin elevation.  LHC showed stable anatomy with patent grafts except for SVG-RCA from older CABG.  Suspect that the troponin elevation was due to demand ischemia in the setting of hypertensive emergency and tachycardia (possible run of atrial tachycardia).  Admission in 1/15 with atypical chest pain,  had Lexiscan Cardiolite that showed no ischemia.  Cardiolite in 11/21 with no ischemia/infarction. She has chronic, stable episodes of atypical chest pain. Given normal  Cardiolite and atypical nature of pain, possible microvascular angina. No change to atypical pain pattern.  - No ASA given Eliquis use.  - Continue Crestor 40 mg daily.   - Can continue low dose Toprol XL 12.5 mg daily.      2. Atrial fibrillation: She is on amiodarone 200 daily and apixaban (dose reduction of apixaban given renal function and weight).  - Check LFTs, TSH.  She will need a regular eye exam with amiodarone.  LFTs recently have been mildly elevated.  3. HYPERLIPIDEMIA: Will need to check lipids with Crestor and Zetia at next appt.  4. Bioprosthetic aortic valve: Stable on 4/22 echo.   5. Chronic diastolic CHF: Echo in 31/49 with EF 55-60% with mild LVH.  PYP scan was not suggestive of TTR cardiac amyloidosis.  She looks euvolemic today, NYHA class II-III.  - Continue to take Lasix prn.  6. CKD: Stage III.  - BMET today.  7. Syncope/presyncope: Tachy-brady syndrome (atrial fibrillation and sinus bradycardia), also orthostatic hypotension possibly due to autonomic neuropathy.  She now has Biotronik PPM.  - Stop midodrine today.   Followup in 6 months.   Loralie Champagne 11/12/2020

## 2020-11-14 ENCOUNTER — Ambulatory Visit: Payer: Medicare Other

## 2020-11-14 ENCOUNTER — Other Ambulatory Visit: Payer: Medicare Other

## 2020-11-15 ENCOUNTER — Ambulatory Visit: Payer: Medicare Other | Admitting: Orthopaedic Surgery

## 2020-11-15 ENCOUNTER — Other Ambulatory Visit: Payer: Self-pay

## 2020-11-15 ENCOUNTER — Encounter: Payer: Self-pay | Admitting: Internal Medicine

## 2020-11-15 ENCOUNTER — Ambulatory Visit (INDEPENDENT_AMBULATORY_CARE_PROVIDER_SITE_OTHER): Payer: Medicare Other | Admitting: Internal Medicine

## 2020-11-15 VITALS — BP 138/60 | HR 66 | Ht 63.5 in | Wt 139.4 lb

## 2020-11-15 DIAGNOSIS — I2510 Atherosclerotic heart disease of native coronary artery without angina pectoris: Secondary | ICD-10-CM | POA: Diagnosis not present

## 2020-11-15 DIAGNOSIS — I480 Paroxysmal atrial fibrillation: Secondary | ICD-10-CM

## 2020-11-15 DIAGNOSIS — Z950 Presence of cardiac pacemaker: Secondary | ICD-10-CM

## 2020-11-15 DIAGNOSIS — I495 Sick sinus syndrome: Secondary | ICD-10-CM | POA: Diagnosis not present

## 2020-11-15 NOTE — Patient Instructions (Signed)
Medication Instructions:  Your physician recommends that you continue on your current medications as directed. Please refer to the Current Medication list given to you today.  Labwork: None ordered.  Testing/Procedures: None ordered.  Follow-Up: Your physician wants you to follow-up in: one year with [name], MD or one of the following Advanced Practice Providers on your designated Care Team:    Chanetta Marshall, NP  Tommye Standard, PA-C  [name] "Jonni Sanger" [place], [place]  Remote monitoring is used to monitor your Pacemaker from home. This monitoring reduces the number of office visits required to check your device to one time per year. It allows [place] to keep an eye on the functioning of your device to ensure it is working properly. You are scheduled for a device check from home on 02/09/2021. You may send your transmission at any time that day. If you have a wireless device, the transmission will be sent automatically. After your physician reviews your transmission, you will receive a postcard with your next transmission date.  Any Other Special Instructions Will Be Listed Below (If Applicable).  If you need a refill on your cardiac medications before your next appointment, please call your pharmacy.

## 2020-11-15 NOTE — Progress Notes (Signed)
HPI Hannah [name] returns today for followup of her atrial fib. She is a pleasant 75 yo woman, with HTN, s/p AVR who has had refractory atrial fib. She has been on amio and her dose was increasedinitially but when I saw her last she reduced her dose back to 200 mg daily.Her symptoms are much improved. No chest pain or sob. No syncope. She has continued to have symptomatic bradycardia and her ILR demonstrated frequent episodes of HR's in the 30's during the daytime. She also has a h/o atrial fib with a RVR. she underwent insertion of a Biotronik DDD PM. Her husband notes that she is better. She has not passed out.  Allergies  Allergen Reactions  . Amoxicillin Rash and Other (See Comments)    Tolerates Cephalosporins Has patient had a PCN reaction causing immediate rash, facial/tongue/throat swelling, SOB or lightheadedness with hypotension: Yes Has patient had a PCN reaction causing severe rash involving mucus membranes or skin necrosis: Yes Has patient had a PCN reaction that required hospitalization No Has patient had a PCN reaction occurring within the last 10 years: No If all of the above answers are "NO", then may proceed with Cephalosporin use.   . Tape Other (See Comments)    Pulls off skin, must use paper tape  . Aldactone [Spironolactone] Other (See Comments)    CKD/hypokalemia  . Isosorbide Nitrate     Other reaction(s): fainting  . K-Flex [Orphenadrine]     Other reaction(s): stomach pain  . Arimidex [Anastrozole] Nausea Only  . Latex Itching and Other (See Comments)    (Dentist office)  . Tetracycline Rash     Current Outpatient Medications  Medication Sig Dispense Refill  . Accu-Chek FastClix Lancets MISC Use to check blood sugar 3 times per day 100 each 5  . ACCU-CHEK GUIDE test strip Use to test blood sugar 3 times daily 150 strip 3  . acetaminophen (TYLENOL) 500 MG tablet Take 1,000 mg by mouth as needed for moderate pain.    Marland Kitchen albuterol (PROVENTIL) (2.5  MG/3ML) 0.083% nebulizer solution Take 3 mLs (2.5 mg total) by nebulization every 6 (six) hours as needed for wheezing or shortness of breath. 75 mL 3  . albuterol (VENTOLIN HFA) 108 (90 Base) MCG/ACT inhaler Inhale 2 puffs into the lungs every 6 (six) hours as needed for wheezing or shortness of breath. 6.7 g 10  . amiodarone (PACERONE) 200 MG tablet TAKE ONE TABLET BY MOUTH DAILY 90 tablet 3  . Blood Glucose Monitoring Suppl (ACCU-CHEK GUIDE ME) w/Device KIT Use accu chek meter to check blood sugar three times daily. 1 kit 0  . cetirizine (ZYRTEC) 10 MG tablet Take 10 mg by mouth daily.    . Cholecalciferol (DIALYVITE VITAMIN D 5000) 125 MCG (5000 UT) capsule Take 5,000 Units by mouth daily.    Marland Kitchen ELIQUIS 2.5 MG TABS tablet Take 1 tablet (2.5 mg total) by mouth 2 (two) times daily. 180 tablet 1  . exemestane (AROMASIN) 25 MG tablet TAKE ONE TABLET BY MOUTH DAILY AFTER BREAKFAST 90 tablet 3  . ezetimibe (ZETIA) 10 MG tablet Take 1 tablet (10 mg total) by mouth daily. 90 tablet 3  . famotidine (PEPCID) 20 MG tablet TAKE ONE TABLET BY MOUTH TWICE DAILY. ONE AFTER SUPPER 60 tablet 11  . furosemide (LASIX) 20 MG tablet Take 20 mg by mouth daily as needed for fluid.    Marland Kitchen HUMALOG KWIKPEN 100 UNIT/ML KwikPen INJECT SUBCUTANEOUSLY 4 TO  6 UNITS 3  TIMES DAILY  BEFORE MEALS (Patient taking differently: Inject 6-8 Units into the skin 3 (three) times daily.) 30 mL 3  . insulin glargine (LANTUS) 100 UNIT/ML injection Inject 20 Units into the skin daily.     . Insulin Pen Needle (BD PEN NEEDLE NANO U/F) 32G X 4 MM MISC USE AS INSTRUCTED TO INJECT INSULIN 4 TIMES DAILY. 360 each 1  . LANTUS SOLOSTAR 100 UNIT/ML Solostar Pen INJECT SUBCUTANEOUSLY 16  UNITS DAILY IN THE MORNING 15 mL 3  . metoprolol succinate (TOPROL-XL) 25 MG 24 hr tablet Take 0.5 tablets (12.5 mg total) by mouth daily. 45 tablet 3  . nitroGLYCERIN (NITROSTAT) 0.4 MG SL tablet Place 1 tablet (0.4 mg total) under the tongue every 5 (five) minutes as  needed for chest pain. 25 tablet 3  . ondansetron (ZOFRAN ODT) 4 MG disintegrating tablet Take 1 tablet (4 mg total) by mouth every 8 (eight) hours as needed. 10 tablet 0  . rosuvastatin (CRESTOR) 40 MG tablet Take 1 tablet (40 mg total) by mouth at bedtime. 90 tablet 3  . SYMBICORT 160-4.5 MCG/ACT inhaler INHALE TWO PUFFS INTO THE LUNGS TWICE DAILY 10.2 g 1  . TRULICITY 1.5 KG/8.1EH SOPN INJECT THE CONTENTS OF ONE  PEN SUBCUTANEOUSLY WEEKLY  AS DIRECTED 6 mL 3  . vitamin B-12 (CYANOCOBALAMIN) 100 MCG tablet Take 100 mcg by mouth daily.    Marland Kitchen zolpidem (AMBIEN) 5 MG tablet Take 7.5 mg by mouth at bedtime as needed for sleep.     No current facility-administered medications for this visit.     Past Medical History:  Diagnosis Date  . Abnormally small mouth   . Allergic rhinitis 10/14/2009   Qualifier: Diagnosis of  By: Lamonte Sakai MD, Rose Fillers   Overview:  Overview:  Qualifier: Diagnosis of  By: Lamonte Sakai MD, Rose Fillers  Last Assessment & Plan:  Please continue Xyzal and Nasacort as you have been using them  . Anemia   . Asthma 05/12/2009   10/12/2014 p extensive coaching HFA effectiveness =    75% s spacer    Overview:  Overview:  10/12/2014 p extensive coaching HFA effectiveness =    75% s spacer   Last Assessment & Plan:  Please continue Symbicort 2 puffs twice a day. Remember to rinse and gargle after taking this medication. Take albuterol 2 puffs up to every 4 hours if needed for shortness of breath.  Follow with Dr Lamonte Sakai in 6 month  . Bilateral carotid artery stenosis    a. mild 1-39% by duplex 10/2017, due 2021.  . Breast cancer ([place]) 01/18/2016   right breast  . CAD (coronary artery disease) 01/24/2011   a. s/p CABG 1998 with redo 2009.  Marland Kitchen Chemotherapy induced nausea and vomiting 02/24/2016  . Chemotherapy-induced peripheral neuropathy ([place]) 04/27/2016  . 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 Chronic diastolic CHF (congestive heart failure) ([place]) 09/24/2013  . CKD (chronic kidney disease),  stage IV ([place]) 09/24/2013  . Closed fracture of head of left humerus 09/09/2017  . Complication of anesthesia   . Dysrhythmia    a-fib  . Gallstones   . GERD (gastroesophageal reflux disease)   . Glaucoma   . H/O atrial tachycardia 05/12/2009   Qualifier: History of  By: Lamonte Sakai MD, Rose Fillers   Overview:  Overview:  Qualifier: History of  By: Lamonte Sakai MD, Rose Fillers  Last Assessment & Plan:  There was no evidence of this on her cardiac monitor. I would recommend watchful waiting.   Marland Kitchen Heart  murmur   . History of kidney stones   . History of non-ST elevation myocardial infarction (NSTEMI)    Sept 2014--  thought to be type II HTN w/ LHC without infarct related artery and patent grafts  . History of radiation therapy 05/24/16-07/26/16   right breast 50.4 Gy in 28 fractions, right breast boost 10 Gy in 5 fractions  . Hyperlipidemia   . Hypotension   . Iron deficiency anemia   . Mania ([place]) 03/05/2016  . Moderate persistent asthma    pulmologist-  Dr. Malvin Johns  . Osteomyelitis of toe of left foot ([place]) 04/06/2016  . Osteopenia of multiple sites 10/19/2015  . PAF (paroxysmal atrial fibrillation) ([place])   . Personal history of chemotherapy 2017  . Personal history of radiation therapy 2017  . PONV (postoperative nausea and vomiting)   . Port catheter in place 02/17/2016  . Psoriasis    right leg  . Renal calculus, right   . S/P AVR    prosthesis valve placement 2009 at same time re-do CABG  . Seizure-like activity ([place]) 09/01/2017  . Sensorineural hearing loss (SNHL) of both ears 01/06/2016  . Stroke [facility]) 2014   residual rt hearing loss  . Syncope 09/06/2017  . Type 2 diabetes mellitus ([place])    monitored by dr Dwyane Dee    ROS:   All systems reviewed and negative except as noted in the HPI.   Past Surgical History:  Procedure Laterality Date  . AORTIC VALVE REPLACEMENT  [facility]   [facility]done same time is CABG)  . BREAST BIOPSY Right 02/12/2019   2 lymphnodes, 1  breast  . BREAST LUMPECTOMY Right 01/18/2016  . BREAST LUMPECTOMY WITH RADIOACTIVE SEED AND SENTINEL LYMPH NODE BIOPSY Right 01/18/2016   Procedure: RIGHT BREAST LUMPECTOMY WITH RADIOACTIVE SEED AND SENTINEL LYMPH NODE BIOPSY;  Surgeon: DAlphonsa Overall MD;  [place]  Service: General;  Laterality: Right;  . CARDIAC CATHETERIZATION  03/23/2008   Pre-redo CABG: L main OK, LAD (T), CFX (T), OM1 99%, RCA (T), LIMA-LAD OK, SVG-OM(?3) OK w/ little florw to OM2, SVG-RCA OK. EF NL  . CARPAL TUNNEL RELEASE    . CHEST TUBE INSERTION Right 06/26/2019   Procedure: INSERTION PLEURAL DRAINAGE CATHETER;  Surgeon: BGaye Pollack MD;  [facility]  Service: Thoracic;  Laterality: Right;  . CHOLECYSTECTOMY N/A 07/07/2018   Procedure: LAPAROSCOPIC CHOLECYSTECTOMY WITH INTRAOPERATIVE CHOLANGIOGRAM ERAS PATHWAY;  Surgeon: NAlphonsa Overall MD;  [facility];  Service: General;  Laterality: N/A;  . COLONOSCOPY     around 2015. Possibly with [facility]   . CORONARY ARTERY BYPASS GRAFT  1998 &  re-do 2009   Had LIMA to DX/LAD, [facility] SVG to 3rd OM and PD at time of redo  . CYSTOSCOPY W/ URETERAL STENT PLACEMENT Right 12/20/2014   Procedure: CYSTOSCOPY WITH RETROGRADE PYELOGRAM/URETERAL STENT PLACEMENT;  Surgeon: PCleon Gustin MD;  [facility]  Service: Urology;  Laterality: Right;  . CYSTOSCOPY WITH RETROGRADE PYELOGRAM, URETEROSCOPY AND STENT PLACEMENT Left 08/21/2019   Procedure: CYSTOSCOPY WITH RETROGRADE PYELOGRAM, URETEROSCOPY AND STENT PLACEMENT;  Surgeon: MCleon Gustin MD;  [facility];  Service: Urology;  Laterality: Left;  1 HR  . ESOPHAGOGASTRODUODENOSCOPY     many years ago per patient   . ESOPHAGOGASTRODUODENOSCOPY ENDOSCOPY  06/17/2018  . EYE SURGERY Bilateral    cataracts  . HOLMIUM LASER APPLICATION Right 72/48/2500  Procedure:  HOLMIUM LASER LITHOTRIPSY;  Surgeon: Cleon Gustin, MD;  Location: [facility];  Service: Urology;  Laterality: Right;  . HOLMIUM LASER APPLICATION Left 7/62/8315   Procedure: HOLMIUM LASER APPLICATION;  Surgeon: Cleon Gustin, MD;  [facility];  Service: Urology;  Laterality: Left;  . IR KYPHO LUMBAR INC FX REDUCE BONE BX UNI/BIL CANNULATION INC/IMAGING  02/12/2020  . IR THORACENTESIS ASP PLEURAL SPACE W/IMG GUIDE  10/03/2018  . IR THORACENTESIS ASP PLEURAL SPACE W/IMG GUIDE  06/08/2019  . LEFT HEART CATHETERIZATION WITH CORONARY/GRAFT ANGIOGRAM N/A 02/23/2013   Procedure: LEFT HEART CATHETERIZATION WITH Beatrix Fetters;  Surgeon: Blane Ohara, MD;  Location[facility] CATH LAB;  Service: Cardiovascular;  Laterality: N/A;  . LOOP RECORDER INSERTION N/A 08/30/2017   Procedure: LOOP RECORDER INSERTION;  Surgeon: Evans Lance, MD;  Location: [facility] CV [facility];  Service: Cardiovascular;  Laterality: N/A;  . LOOP RECORDER REMOVAL N/A 08/10/2020   Procedure: LOOP RECORDER REMOVAL;  Surgeon: Evans Lance, MD;  Location: [place] CV LAB;  Service: Cardiovascular;  Laterality: N/A;  . PACEMAKER IMPLANT N/A 08/10/2020   Procedure: PACEMAKER IMPLANT;  Surgeon: Evans Lance, MD;  Location: [place] CV LAB;  Service: Cardiovascular;  Laterality: N/A;  . PORTACATH PLACEMENT Left 01/18/2016   Procedure: INSERTION PORT-A-CATH;  Surgeon: Alphonsa Overall, MD;  Location: [place];  Service: General;  Laterality: Left;  . portacath removal    . REMOVAL OF PLEURAL DRAINAGE CATHETER Right 07/14/2019   Procedure: REMOVAL OF PLEURAL DRAINAGE CATHETER;  Surgeon: Gaye Pollack, MD;  Location: [place];  Service: Thoracic;  Laterality: Right;  . TALC PLEURODESIS N/A 06/26/2019   Procedure: Pietro Cassis;  Surgeon: Gaye Pollack, MD;  [facility];  Service: Thoracic;  Laterality: N/A;  . TONSILLECTOMY    . TRANSTHORACIC ECHOCARDIOGRAM  02-24-2013      mild LVH,  ef 50-55%/  AV bioprosthesis was present with very mild stenosis and no regurg., mean grandient  81mHg, peak grandient 245mg /  mild MR/  mild LAE and RAE/  moderate TR  . TUBAL LIGATION       Family History  Problem Relation Age of Onset  . Heart disease Father   . Heart failure Father   . Diabetes Maternal Grandmother   . Heart disease Maternal Grandmother   . Diabetes Son   . Healthy Brother        #1  . Heart attack Brother        #2  . Heart disease Brother        #2  . Colon cancer Neg Hx   . Esophageal cancer Neg Hx      Social History   Socioeconomic History  . Marital status: Married    Spouse name: Not on file  . Number of children: 2  . Years of education: Not on file  . Highest education level: Not on file  Occupational History  . Occupation: retired  Tobacco Use  . Smoking status: Never Smoker  . Smokeless tobacco: Never Used  Vaping Use  . Vaping Use: Never used  Substance and Sexual Activity  . Alcohol use: No  . Drug use: No  . Sexual activity: Yes    Birth control/protection: None  Other Topics Concern  . Not on file  Social History Narrative   Right handed    Lives at home with husband    Social Determinants of Health   Financial Resource Strain: Not on  file  Food Insecurity: Not on file  Transportation Needs: Not on file  Physical Activity: Not on file  Stress: Not on file  Social Connections: Not on file  Intimate Partner Violence: Not on file     BP 138/60   Pulse 66   Ht 5' 3.5" (1.613 m)   Wt 139 lb 6.4 oz (63.2 kg)   LMP  (LMP Unknown)   SpO2 99%   BMI 24.31 kg/m   Physical Exam:  Well appearing NAD HEENT: Unremarkable Neck:  No JVD, no thyromegally Lymphatics:  No adenopathy Back:  No CVA tenderness Lungs:  Clear with no wheezes HEART:  Regular rate rhythm, no murmurs, no rubs, no clicks Abd:  soft, positive bowel sounds, no organomegally, no rebound, no guarding Ext:  2 plus pulses, no edema, no cyanosis, no clubbing Skin:  No rashes no nodules Neuro:  CN II through XII intact, motor grossly  intact  EKG - nsr  DEVICE  Normal device function.  See PaceArt for details.   Assess/Plan: 1. Symptomatic tachy-brady syndrome - She is s/p PPM insertion and is doing better. No symptomatic pauses 2. Syncope - she has not had any more spells  3. CAD - she is s/p CABG but denies any anginal symptoms. We will follow. 4. PAF - She has had no recurrent atrial fib since her PPM was placed.  Carleene Overlie Dorrine Montone,MD

## 2020-11-16 ENCOUNTER — Other Ambulatory Visit: Payer: Self-pay | Admitting: Emergency Medicine

## 2020-11-18 ENCOUNTER — Inpatient Hospital Stay: Payer: Medicare Other | Attending: Adult Health

## 2020-11-18 ENCOUNTER — Inpatient Hospital Stay: Payer: Medicare Other

## 2020-11-18 ENCOUNTER — Telehealth: Payer: Self-pay | Admitting: Hematology and Oncology

## 2020-11-18 ENCOUNTER — Other Ambulatory Visit: Payer: Self-pay

## 2020-11-18 ENCOUNTER — Other Ambulatory Visit: Payer: Self-pay | Admitting: Adult Health

## 2020-11-18 DIAGNOSIS — N189 Chronic kidney disease, unspecified: Secondary | ICD-10-CM | POA: Insufficient documentation

## 2020-11-18 DIAGNOSIS — Z79899 Other long term (current) drug therapy: Secondary | ICD-10-CM | POA: Diagnosis not present

## 2020-11-18 DIAGNOSIS — D631 Anemia in chronic kidney disease: Secondary | ICD-10-CM | POA: Insufficient documentation

## 2020-11-18 DIAGNOSIS — I129 Hypertensive chronic kidney disease with stage 1 through stage 4 chronic kidney disease, or unspecified chronic kidney disease: Secondary | ICD-10-CM | POA: Diagnosis present

## 2020-11-18 DIAGNOSIS — C50211 Malignant neoplasm of upper-inner quadrant of right female breast: Secondary | ICD-10-CM

## 2020-11-18 DIAGNOSIS — Z170 Estrogen receptor positive status [ER+]: Secondary | ICD-10-CM

## 2020-11-18 LAB — CMP (CANCER CENTER ONLY)
ALT: 36 U/L (ref 0–44)
AST: 41 U/L (ref 15–41)
Albumin: 2.9 g/dL — ABNORMAL LOW (ref 3.5–5.0)
Alkaline Phosphatase: 70 U/L (ref 38–126)
Anion gap: 6 (ref 5–15)
BUN: 45 mg/dL — ABNORMAL HIGH (ref 8–23)
CO2: 27 mmol/L (ref 22–32)
Calcium: 9.4 mg/dL (ref 8.9–10.3)
Chloride: 107 mmol/L (ref 98–111)
Creatinine: 2.43 mg/dL — ABNORMAL HIGH (ref 0.44–1.00)
GFR, Estimated: 20 mL/min — ABNORMAL LOW (ref >60)
Glucose, Bld: 110 mg/dL — ABNORMAL HIGH (ref 70–99)
Potassium: 4.9 mmol/L (ref 3.5–5.1)
Sodium: 140 mmol/L (ref 135–145)
Total Bilirubin: 0.5 mg/dL (ref 0.3–1.2)
Total Protein: 6.4 g/dL — ABNORMAL LOW (ref 6.5–8.1)

## 2020-11-18 LAB — RETIC PANEL
Immature Retic Fract: 8.7 % (ref 2.3–15.9)
RBC.: 3.23 MIL/uL — ABNORMAL LOW (ref 3.87–5.11)
Retic Count, Absolute: 35.9 10*3/uL (ref 19.0–186.0)
Retic Ct Pct: 1.1 % (ref 0.4–3.1)
Reticulocyte Hemoglobin: 33.9 pg (ref >27.9)

## 2020-11-18 LAB — CBC WITH DIFFERENTIAL (CANCER CENTER ONLY)
Abs Immature Granulocytes: 0.02 10*3/uL (ref 0.00–0.07)
Basophils Absolute: 0 10*3/uL (ref 0.0–0.1)
Basophils Relative: 1 %
Eosinophils Absolute: 0.2 10*3/uL (ref 0.0–0.5)
Eosinophils Relative: 5 %
HCT: 28.9 % — ABNORMAL LOW (ref 36.0–46.0)
Hemoglobin: 8.7 g/dL — ABNORMAL LOW (ref 12.0–15.0)
Immature Granulocytes: 0 %
Lymphocytes Relative: 10 %
Lymphs Abs: 0.5 10*3/uL — ABNORMAL LOW (ref 0.7–4.0)
MCH: 27.3 pg (ref 26.0–34.0)
MCHC: 30.1 g/dL (ref 30.0–36.0)
MCV: 90.6 fL (ref 80.0–100.0)
Monocytes Absolute: 0.4 10*3/uL (ref 0.1–1.0)
Monocytes Relative: 9 %
Neutro Abs: 3.4 10*3/uL (ref 1.7–7.7)
Neutrophils Relative %: 75 %
Platelet Count: 115 10*3/uL — ABNORMAL LOW (ref 150–400)
RBC: 3.19 MIL/uL — ABNORMAL LOW (ref 3.87–5.11)
RDW: 18.3 % — ABNORMAL HIGH (ref 11.5–15.5)
WBC Count: 4.6 10*3/uL (ref 4.0–10.5)
nRBC: 0 % (ref 0.0–0.2)

## 2020-11-18 LAB — IRON AND TIBC
Iron: 76 ug/dL (ref 41–142)
Saturation Ratios: 23 % (ref 21–57)
TIBC: 328 ug/dL (ref 236–444)
UIBC: 251 ug/dL (ref 120–384)

## 2020-11-18 LAB — SAMPLE TO BLOOD BANK

## 2020-11-18 LAB — FERRITIN: Ferritin: 50 ng/mL (ref 11–307)

## 2020-11-18 NOTE — Progress Notes (Signed)
Spoke to [place] and Wilber Bihari about the patient's Hgb which was 8.7. Mendel Ryder suggested that the patient receive iron due to her ferritin being less than 100. This was explained to the patient and she understood. Patient informed that scheduling will call her with an appointment time for the iron, and the patient will not receive Aranesp today. Patient verbalized understanding.

## 2020-11-18 NOTE — Progress Notes (Signed)
Patient here today for aranesp and I was asked to place orders.  I reviewed her labs and noted her ferritin is less than 100.  She needs IV iron to raise ferritin above 100 per packaging guidelines of Aranesp for it to be effective.  She will not receive Aranesp today, instead I placed orders for IV iron with [facility] as she has received in the past.  Wilber Bihari, NP

## 2020-11-18 NOTE — Telephone Encounter (Signed)
Scheduled appointment per 06/10 secure chat. Patient is aware.

## 2020-11-19 IMAGING — CR DG CHEST 2V
2 series · 2 of 2 positions shown · non-contrast
Comparison: Chest radiograph 02/20/2019

CLINICAL DATA: Hx pleural effusion, some sob

EXAM:
CHEST - 2 VIEW

[w chest pa]
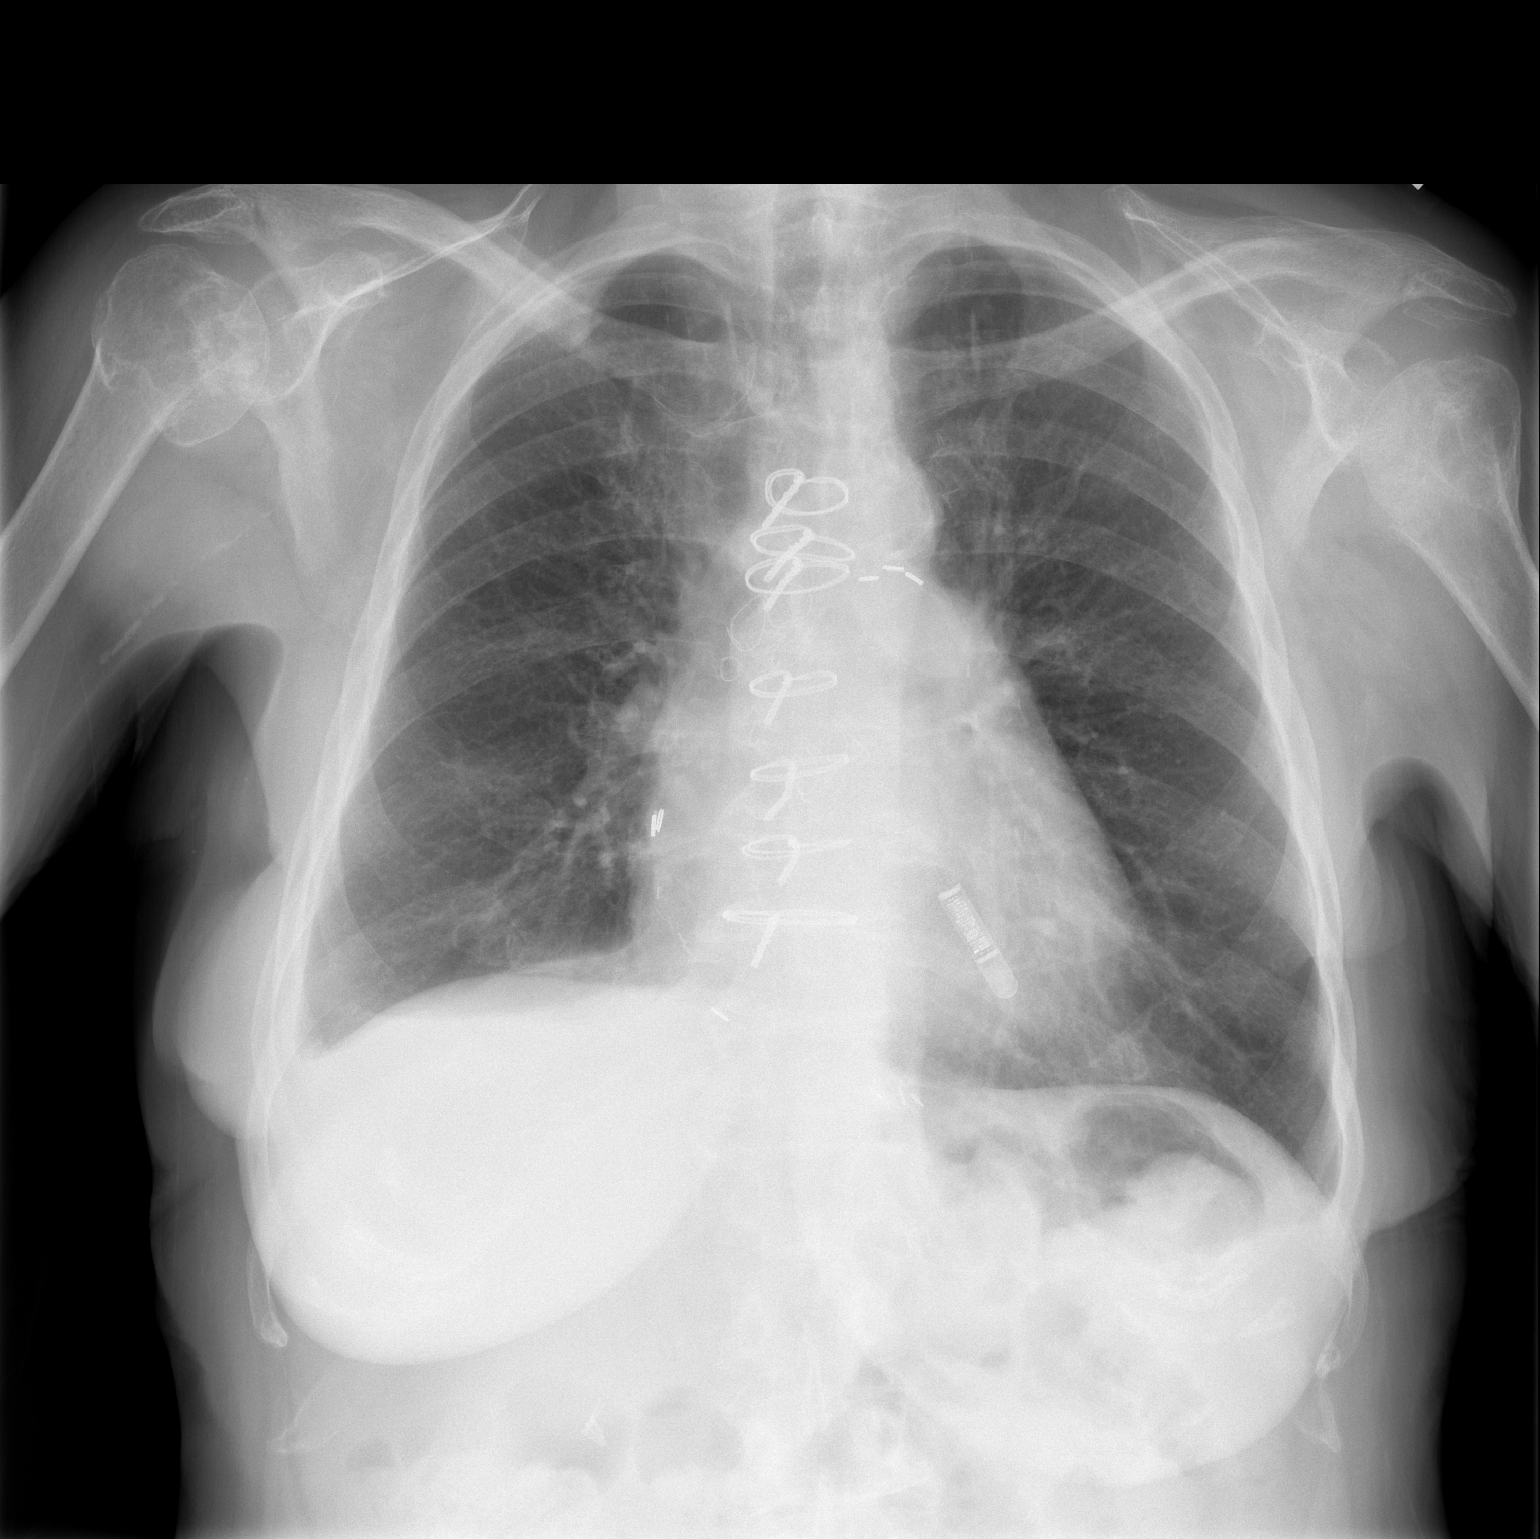

[w chest lat]
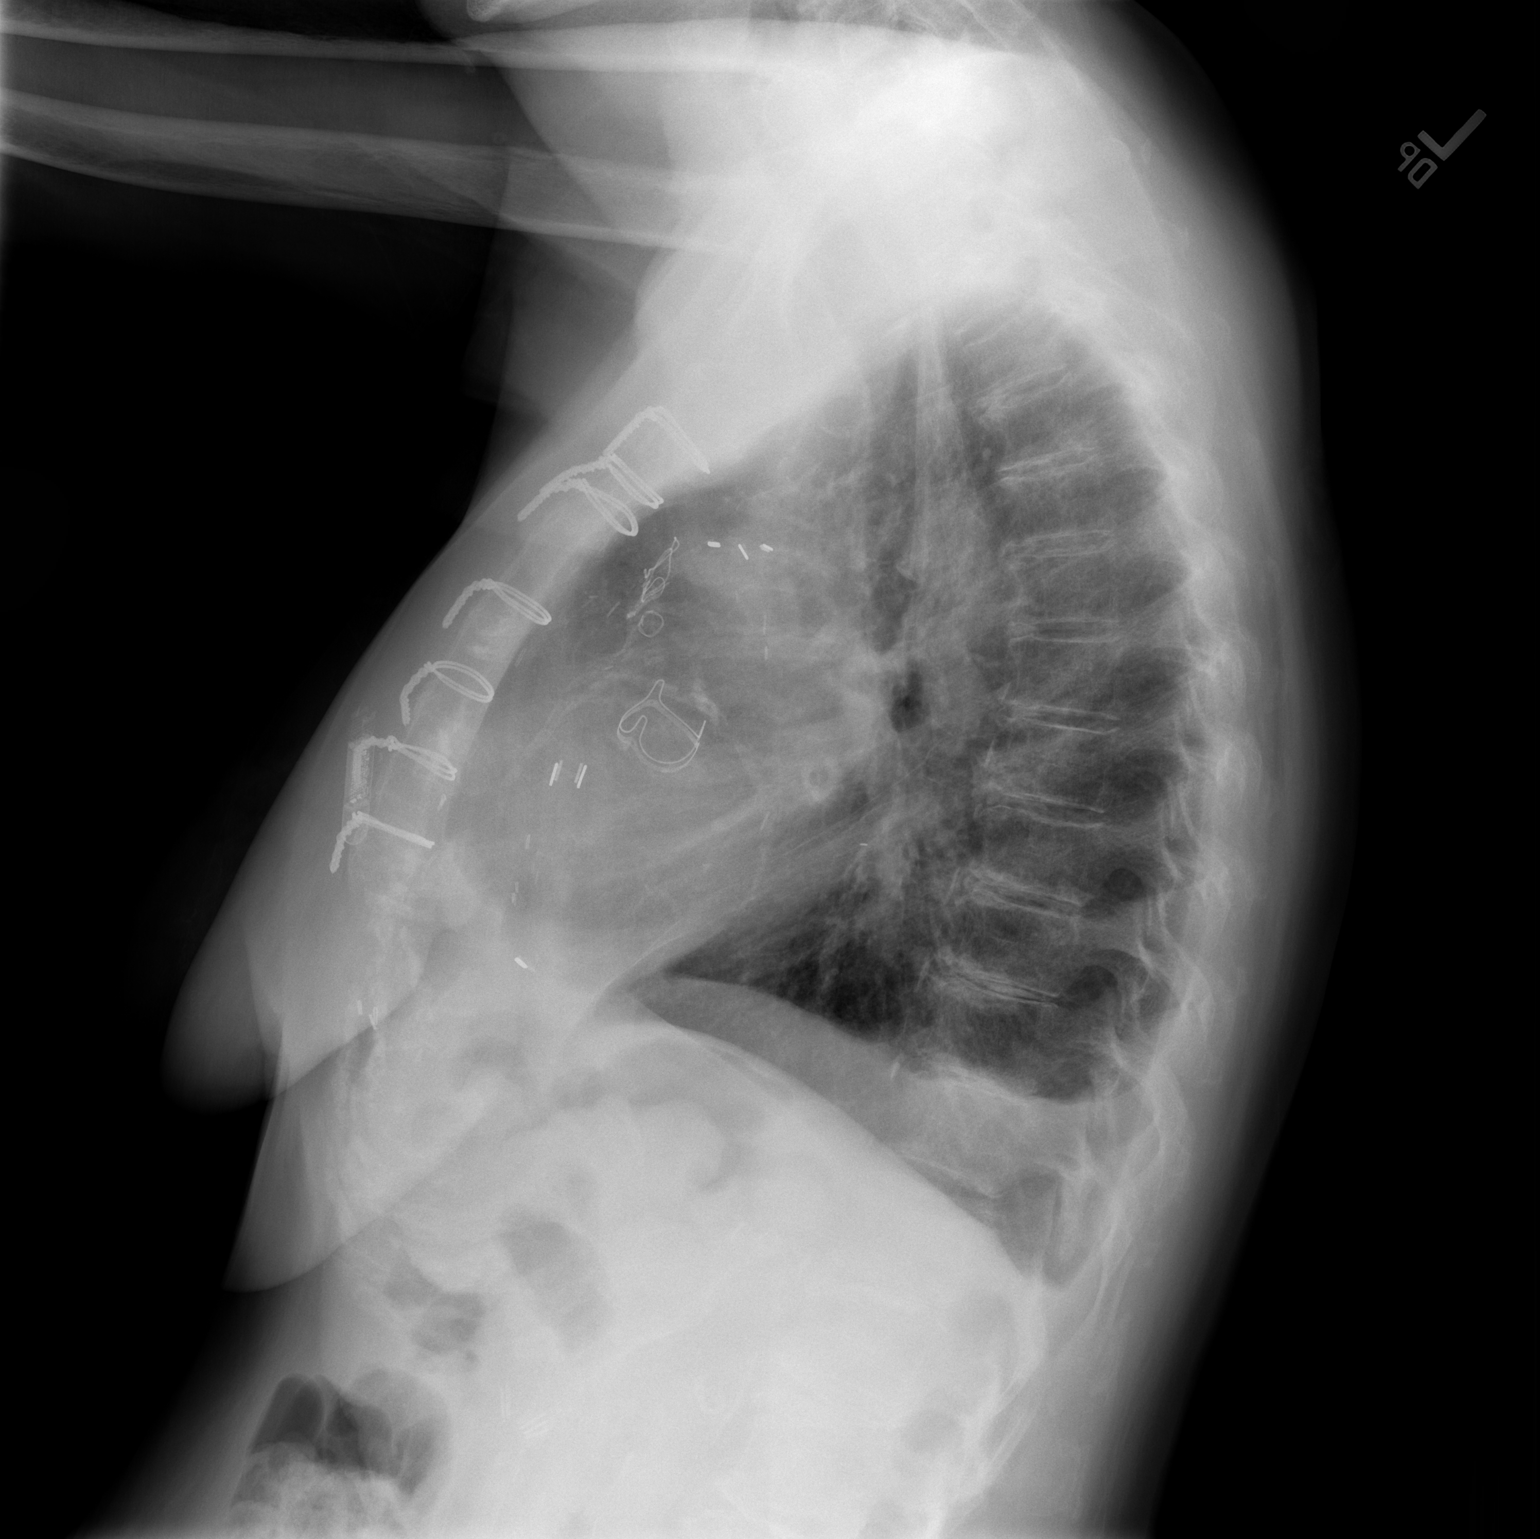

[2 of 2 positions shown; findings below may reference images not displayed]

FINDINGS: Stable cardiomediastinal contours status post median sternotomy and
valvular replacement. Resolution of the previously seen right lower
lobe airspace opacity. Decreased right pleural effusion with small
amount of pleural fluid remaining. The left lung is clear.
Degenerative changes in the bilateral shoulders.
IMPRESSION: 1. Decreased right pleural effusion with small amount of pleural
fluid remaining.
2. Resolution of previously seen right lower lobe airspace opacity.

## 2020-11-19 IMAGING — CT CT HEAD W/O CM
3 series · 16 of 47 positions shown, 19 images · non-contrast
Comparison: 09/01/2017

CLINICAL DATA: Head trauma.  Patient on anticoagulation.  Hematoma.

EXAM:
CT HEAD WITHOUT CONTRAST
TECHNIQUE: Contiguous axial images were obtained from the base of the skull
through the vertex without intravenous contrast.

[Series 3: head 5.0 h30s · axial · 0.44mm/px · z∈[+1119,+1264]mm · 10 of 35 slices shown, 13 images]
[im 3/35  brain]
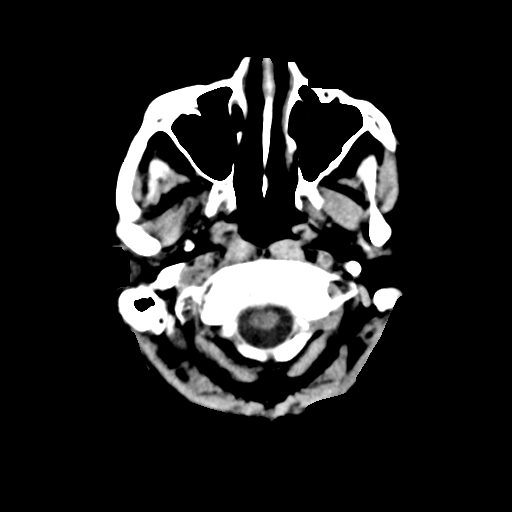
[im 3/35  bone]
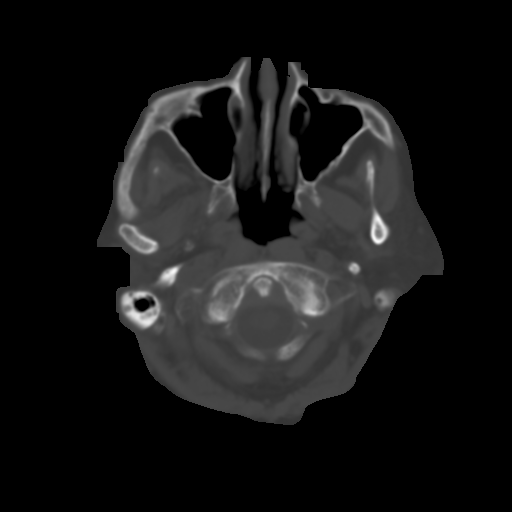
[im 6/35  brain]
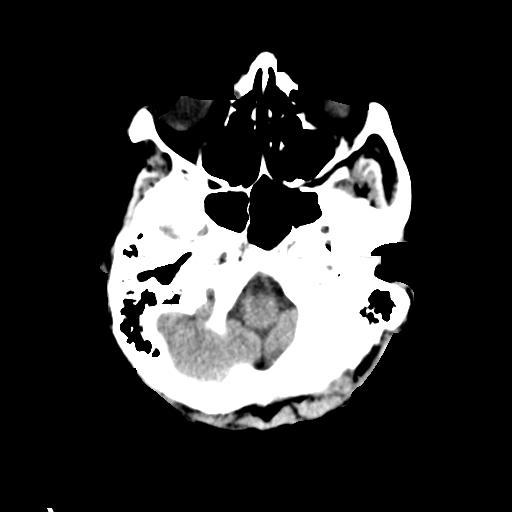
[im 10/35  brain]
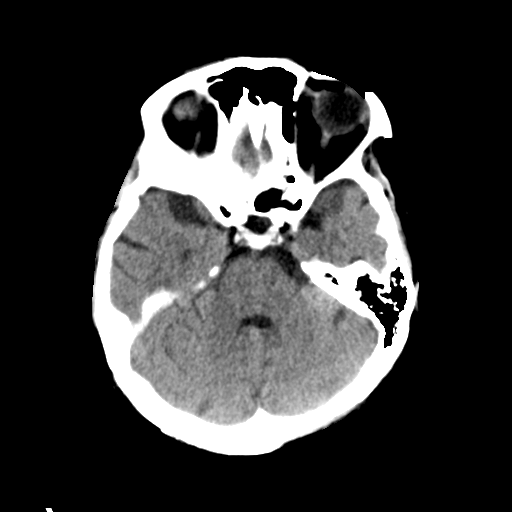
[im 12/35  brain]
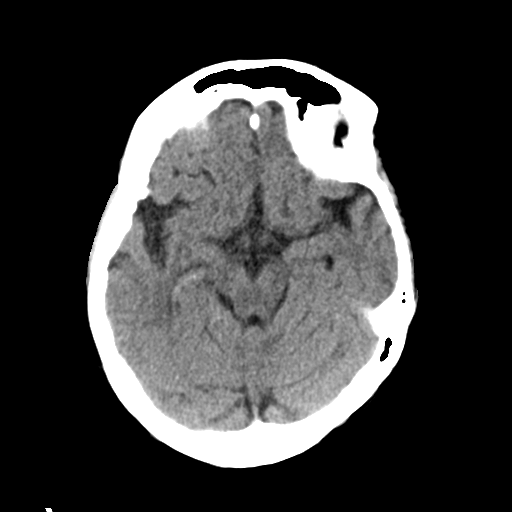
[im 16/35  brain]
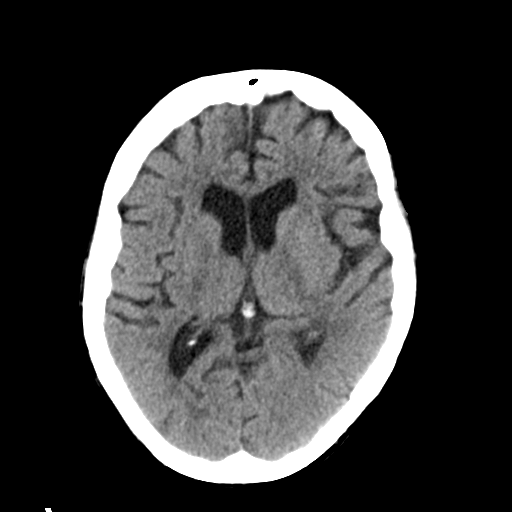
[im 16/35  bone]
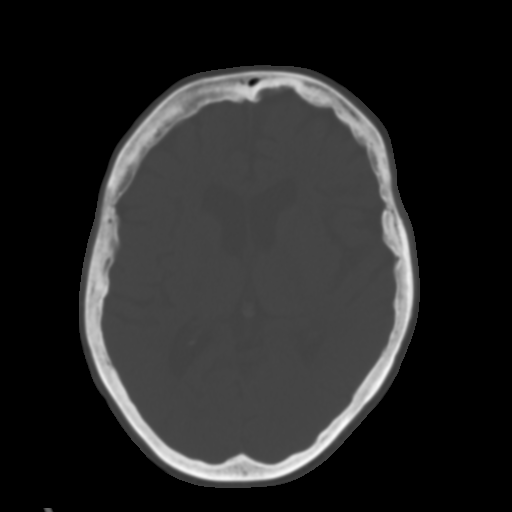
[im 19/35  brain]
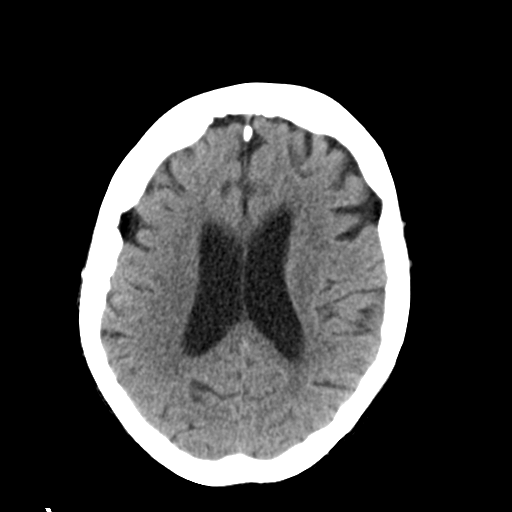
[im 23/35  brain]
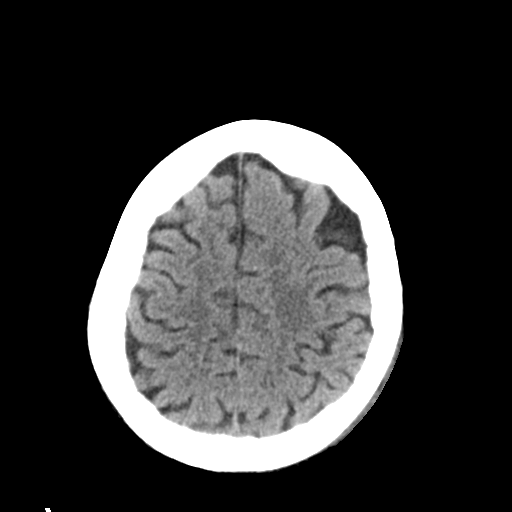
[im 26/35  brain]
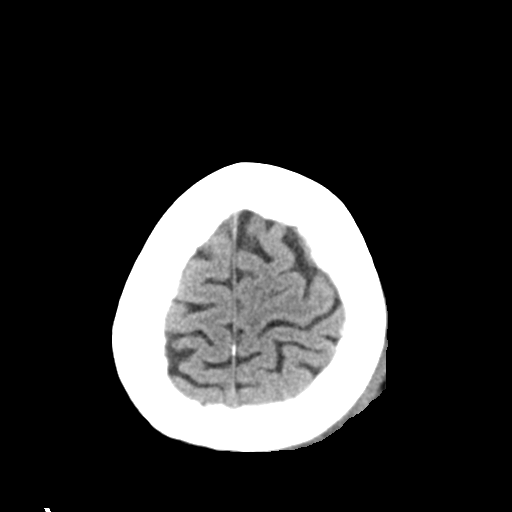
[im 29/35  brain]
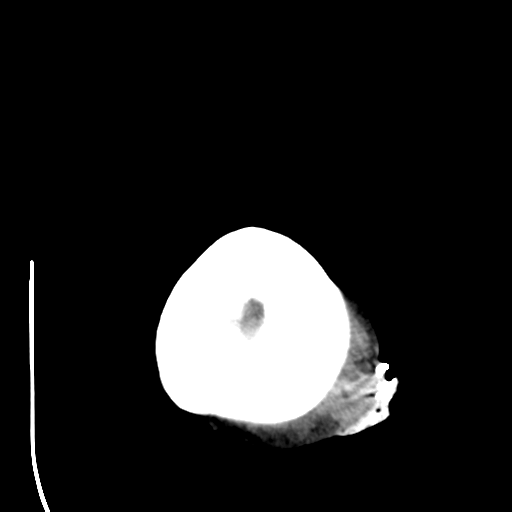
[im 29/35  bone]
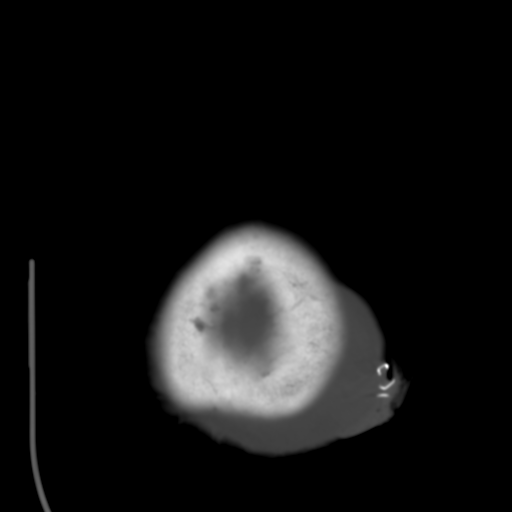
[im 32/35  brain]
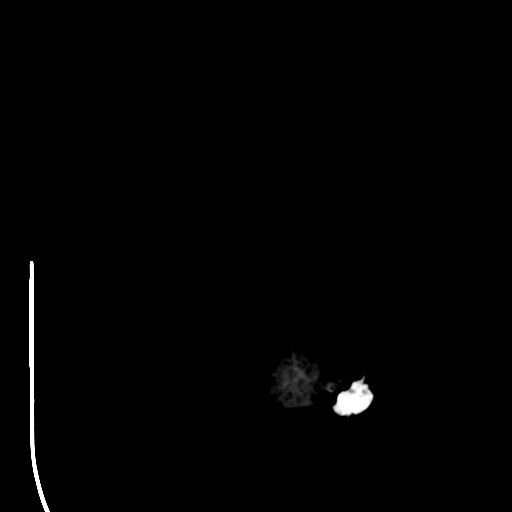

[Series 5: head 3.0 mpr cor · coronal · 0.31mm/px · 3 of 67 slices shown]
[im 23/67  brain]
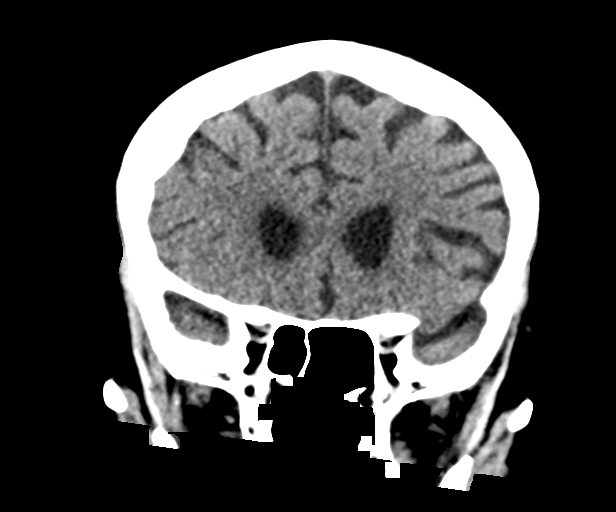
[im 30/67  brain]
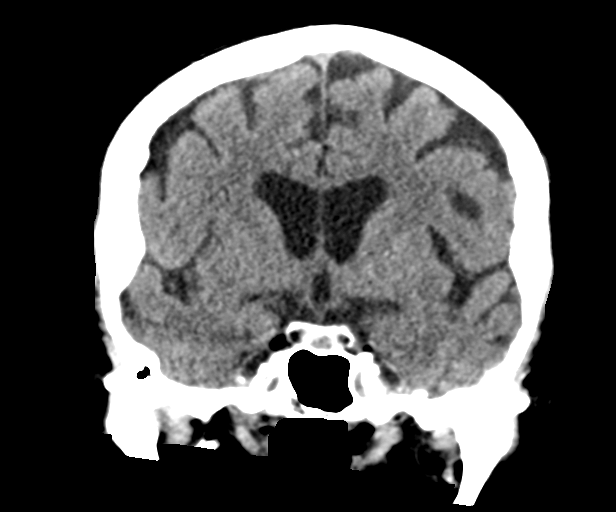
[im 37/67  brain]
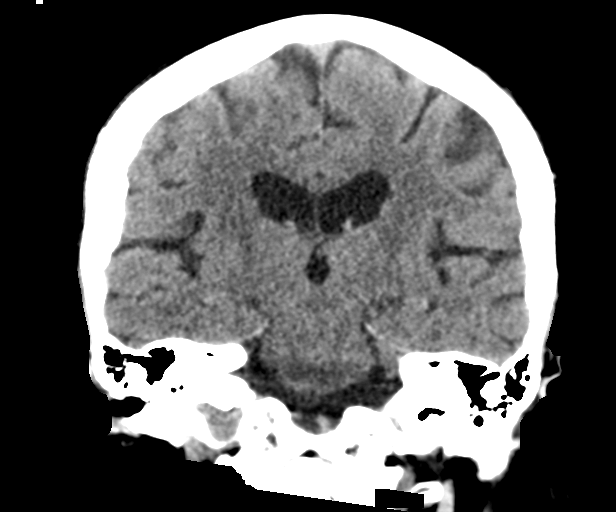

[Series 6: head 3.0 mpr sag · sagittal · 0.34mm/px · 3 of 66 slices shown]
[im 22/66  brain]
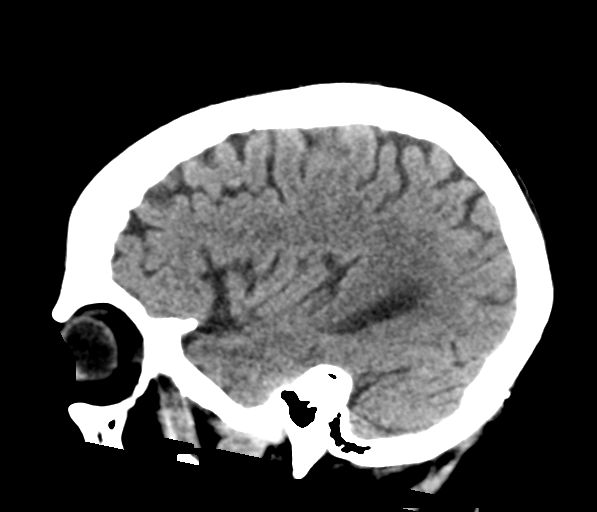
[im 33/66  brain]
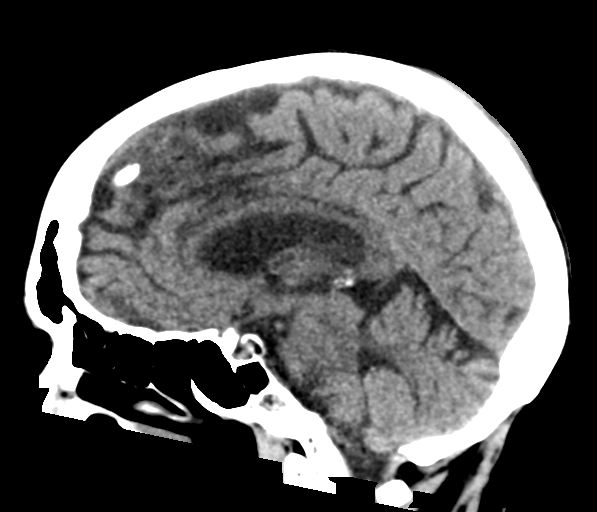
[im 44/66  brain]
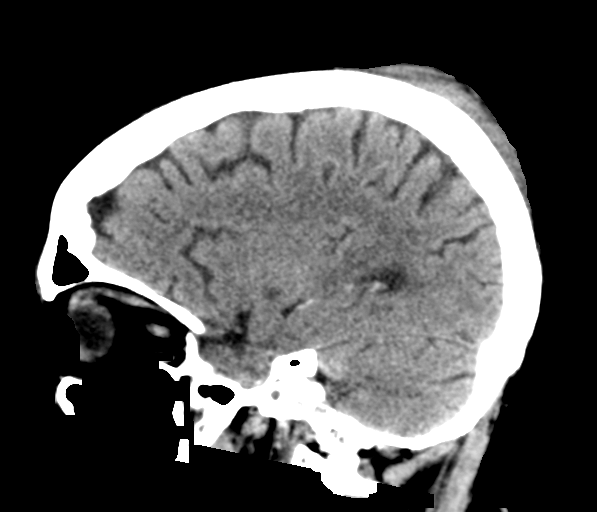

[16 of 47 positions shown; findings below may reference images not displayed]

FINDINGS: Brain: No evidence for acute hemorrhage, mass lesion, midline shift,
hydrocephalus or large infarct. Stable punctate calcification in the
right basal ganglia.

Vascular: No hyperdense vessel or unexpected calcification.

Skull: Normal. Negative for fracture or focal lesion.

Sinuses/Orbits: No acute finding.

Other: Large left posterior scalp hematoma with skin staples.
IMPRESSION: 1. No acute intracranial abnormality. Specifically, no evidence for
an acute intracranial hemorrhage.
2. Large left posterior scalp hematoma.  No underlying fracture.

## 2020-11-21 ENCOUNTER — Other Ambulatory Visit: Payer: Self-pay

## 2020-11-21 ENCOUNTER — Inpatient Hospital Stay: Payer: Medicare Other

## 2020-11-21 VITALS — BP 118/72 | HR 62 | Temp 98.2°F | Resp 18

## 2020-11-21 DIAGNOSIS — D638 Anemia in other chronic diseases classified elsewhere: Secondary | ICD-10-CM

## 2020-11-21 DIAGNOSIS — I129 Hypertensive chronic kidney disease with stage 1 through stage 4 chronic kidney disease, or unspecified chronic kidney disease: Secondary | ICD-10-CM | POA: Diagnosis not present

## 2020-11-21 MED ORDER — SODIUM CHLORIDE 0.9 % IV SOLN
200.0000 mg | Freq: Once | INTRAVENOUS | Status: AC
  Administered 2020-11-21: 200 mg via INTRAVENOUS
  Filled 2020-11-21: qty 200

## 2020-11-21 NOTE — Patient Instructions (Signed)

## 2020-11-21 NOTE — Progress Notes (Signed)
Patient waited 30 minute post observation for iron with no issues

## 2020-11-22 ENCOUNTER — Ambulatory Visit: Payer: Medicare Other

## 2020-11-25 ENCOUNTER — Telehealth: Payer: Self-pay | Admitting: Hematology and Oncology

## 2020-11-25 ENCOUNTER — Telehealth: Payer: Self-pay | Admitting: Endocrinology

## 2020-11-25 DIAGNOSIS — E1165 Type 2 diabetes mellitus with hyperglycemia: Secondary | ICD-10-CM

## 2020-11-25 DIAGNOSIS — Z794 Long term (current) use of insulin: Secondary | ICD-10-CM

## 2020-11-25 NOTE — Telephone Encounter (Signed)
Sch per 6/10 sch msg, left message

## 2020-11-25 NOTE — Telephone Encounter (Signed)
Pt has reached out to the insurance company about the [employer]. Pt is wondering what the next steps need to happen so pt can receive the freestyle. Pt would like a call back as soon as possible

## 2020-11-28 NOTE — Telephone Encounter (Signed)
Message left on machine to call me to decide where to send the prescription.  She will need a DME supplier

## 2020-11-30 NOTE — Telephone Encounter (Signed)
Patient  that she had once used [employer].  She was given the number to call to update her insurance information.  Note sent to the pool to order [place] 2 sensors, and reader.

## 2020-12-01 ENCOUNTER — Other Ambulatory Visit: Payer: Self-pay

## 2020-12-01 ENCOUNTER — Inpatient Hospital Stay: Payer: Medicare Other

## 2020-12-01 VITALS — BP 118/35 | HR 74 | Temp 98.3°F | Resp 18 | Wt 136.8 lb

## 2020-12-01 DIAGNOSIS — I129 Hypertensive chronic kidney disease with stage 1 through stage 4 chronic kidney disease, or unspecified chronic kidney disease: Secondary | ICD-10-CM | POA: Diagnosis not present

## 2020-12-01 DIAGNOSIS — D638 Anemia in other chronic diseases classified elsewhere: Secondary | ICD-10-CM

## 2020-12-01 MED ORDER — SODIUM CHLORIDE 0.9 % IV SOLN
Freq: Once | INTRAVENOUS | Status: AC
Start: 2020-12-01 — End: 2020-12-01
  Filled 2020-12-01: qty 250

## 2020-12-01 MED ORDER — SODIUM CHLORIDE 0.9 % IV SOLN
200.0000 mg | Freq: Once | INTRAVENOUS | Status: AC
  Administered 2020-12-01: 200 mg via INTRAVENOUS
  Filled 2020-12-01: qty 200

## 2020-12-01 MED ORDER — DARBEPOETIN ALFA 500 MCG/ML IJ SOSY: 500.0000 ug | PREFILLED_SYRINGE | Freq: Once | INTRAMUSCULAR | Status: DC

## 2020-12-01 NOTE — Patient Instructions (Signed)

## 2020-12-01 NOTE — Progress Notes (Signed)
Pt. blood pressure 110/38, 118/35. Pt. denies complaints of chest pain, dizziness, and no shortness of breath noted. Pt. also states she did not take her medication this morning that increases her blood pressure and will take when she gets home. Dr. Lindi Adie notified and pt. ok to discharge to home.

## 2020-12-02 ENCOUNTER — Ambulatory Visit: Payer: Medicare Other

## 2020-12-02 IMAGING — CT CT CHEST W/O CM
2 of 4 series · 14 of 36 positions shown, 17 images · non-contrast
Comparison: CT abdomen 05/21/2018

CLINICAL DATA: Epigastric pain. RIGHT breast cancer with lumpectomy
and radiation.

EXAM:
CT CHEST, ABDOMEN AND PELVIS WITHOUT CONTRAST
TECHNIQUE: Multidetector CT imaging of the chest, abdomen and pelvis was
performed following the standard protocol without IV contrast.

[Series 2: axial st · axial · 0.93mm/px · z∈[-622,-72]mm · 11 of 130 slices shown, 14 images]
[im 10/130  mediastinal]
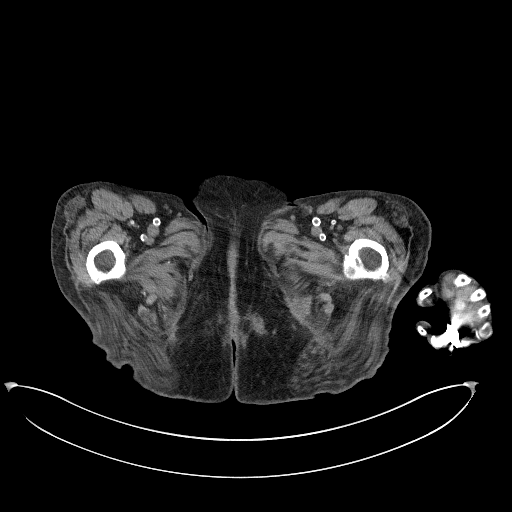
[im 10/130  lung]
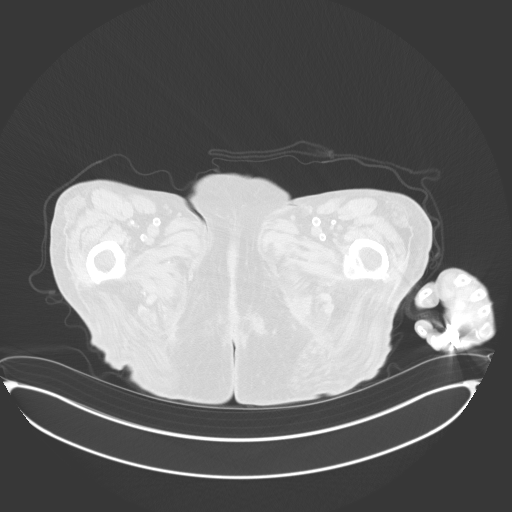
[im 20/130  lung]
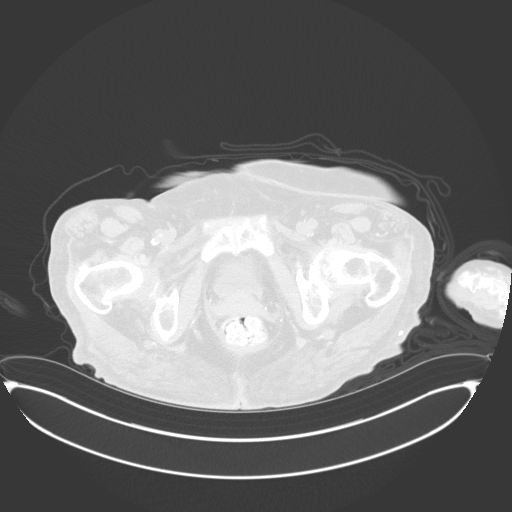
[im 30/130  lung]
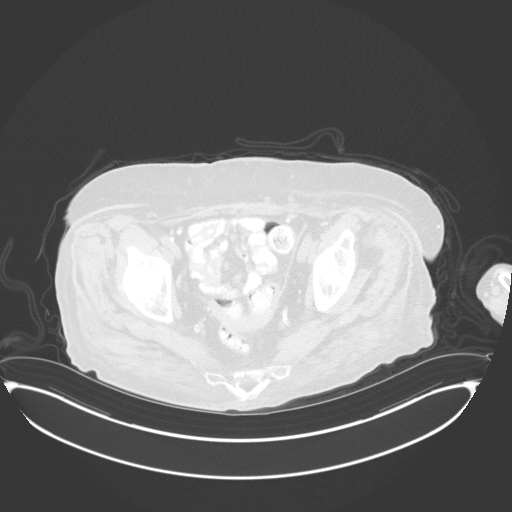
[im 40/130  lung]
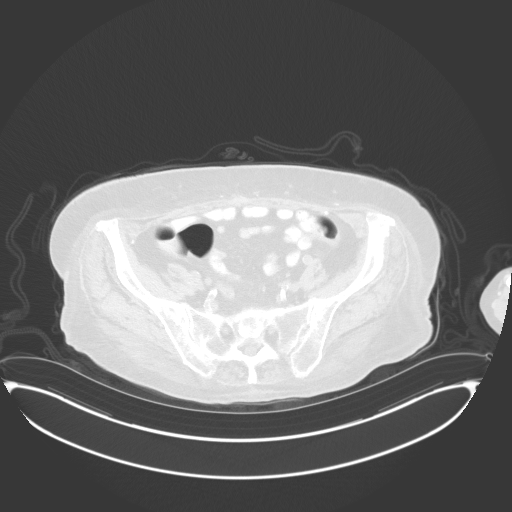
[im 50/130  mediastinal]
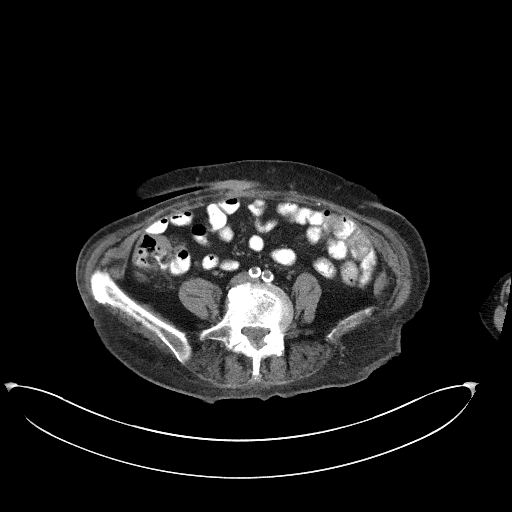
[im 50/130  lung]
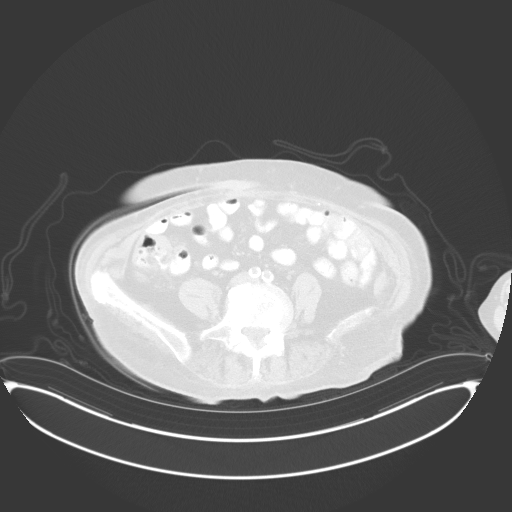
[im 70/130  lung]
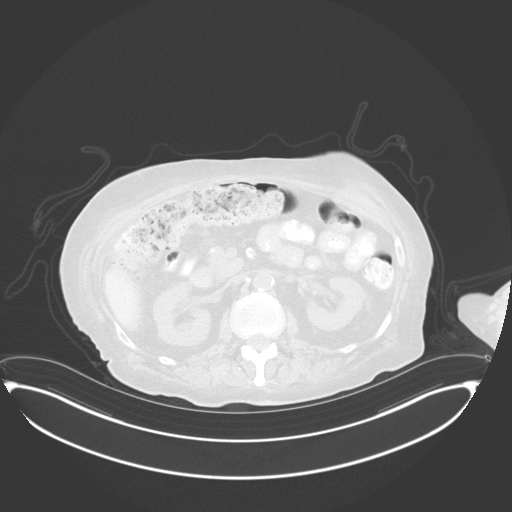
[im 80/130  lung]
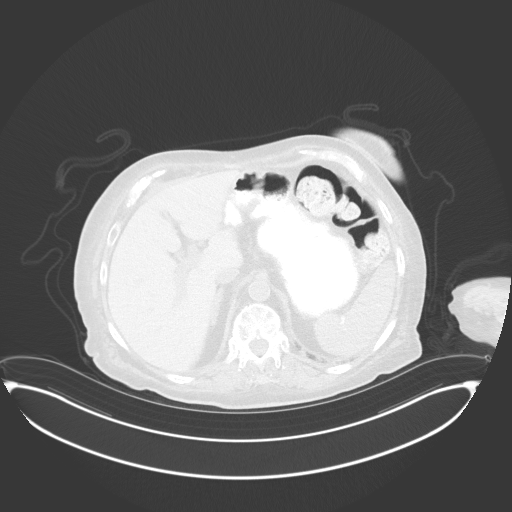
[im 90/130  lung]
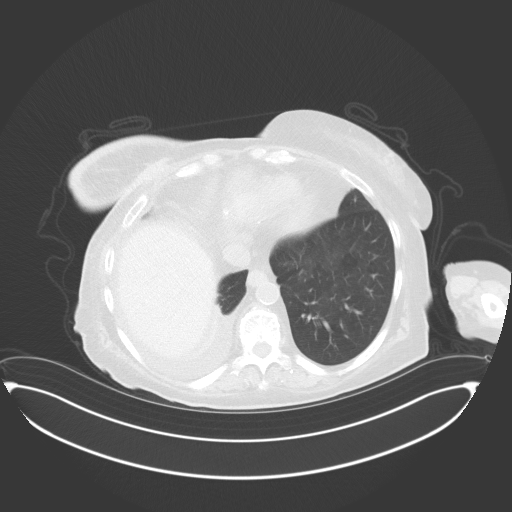
[im 100/130  mediastinal]
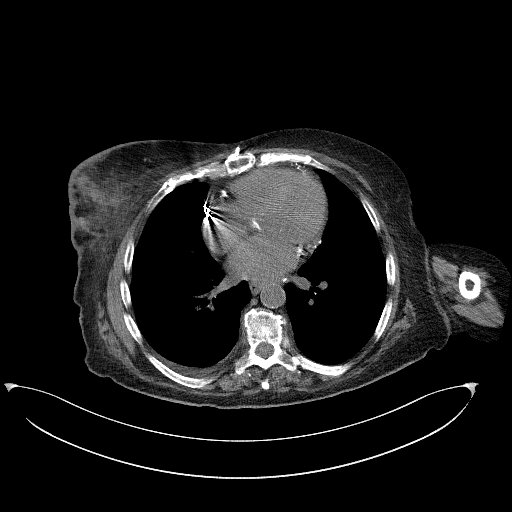
[im 100/130  lung]
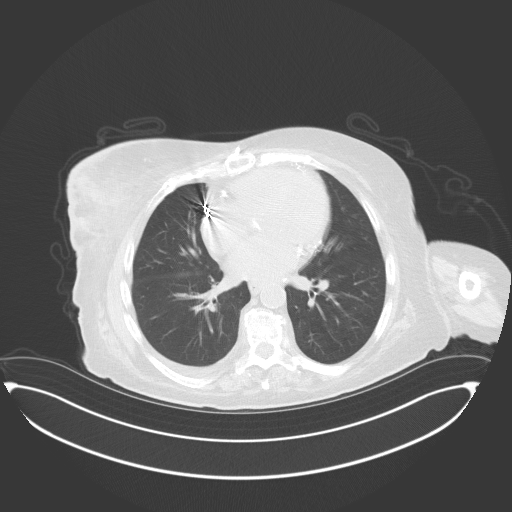
[im 110/130  lung]
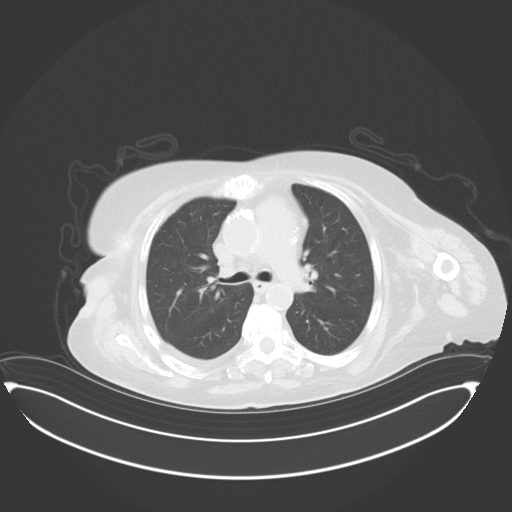
[im 120/130  lung]
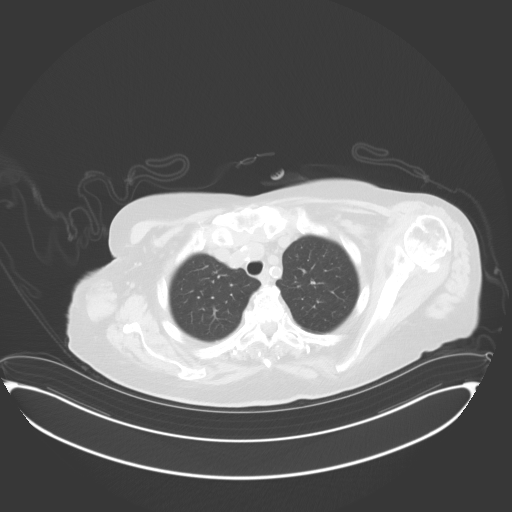

[Series 5: coronal · coronal · 0.88mm/px · 3 of 150 slices shown]
[im 30/150  lung]
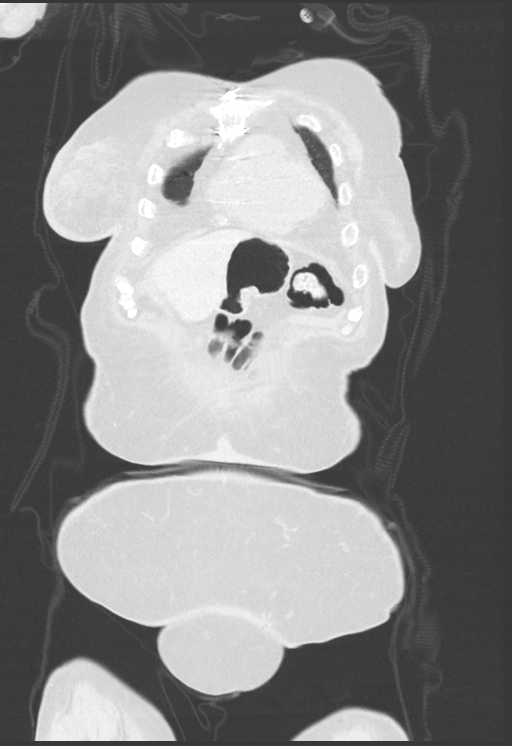
[im 60/150  lung]
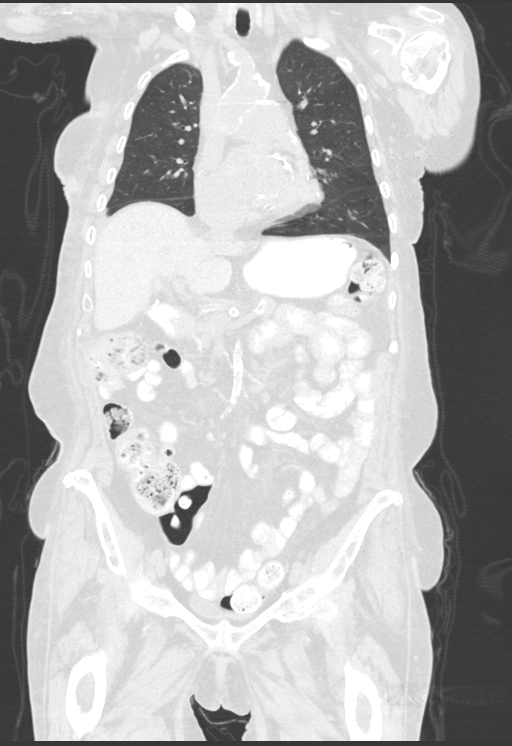
[im 90/150  lung]
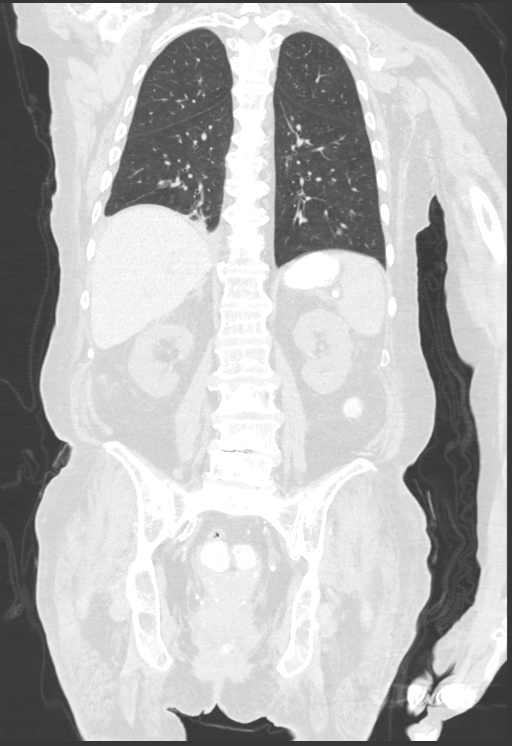

[14 of 36 positions shown; findings below may reference images not displayed]

FINDINGS: CT CHEST FINDINGS

Cardiovascular: Coronary artery calcification and aortic
atherosclerotic calcification. Post aortic valve replacement.

Mediastinum/Nodes: No mediastinal lymphadenopathy. No hilar
adenopathy. No axillary adenopathy.

Lungs/Pleura: No suspicious pulmonary nodules. Mild atelectasis at
the RIGHT lung base. Small RIGHT effusion.

Musculoskeletal: The seroma in the RIGHT breast measuring 3.6 x
cm not changed from comparison exam. There is skin thickening of the
RIGHT breast (image 33/2 which is similar to slightly progressed
from comparison exam.

CT ABDOMEN AND PELVIS FINDINGS

Hepatobiliary: No focal hepatic lesion. Postcholecystectomy. No
biliary dilatation.

Pancreas: Calcifications in the LEFT renal pelvis without renal
obstruction (image 85/5. Nonobstructing calculus in the RIGHT
kidney. No ureterolithiasis or obstructive uropathy. No bladder
calculi

Spleen: Normal spleen

Adrenals/urinary tract: Adrenal glands and kidneys are normal. The
ureters and bladder normal.

Stomach/Bowel: Stomach, small bowel, appendix, and cecum are normal.
The colon and rectosigmoid colon are normal.

Vascular/Lymphatic: Abdominal aorta is normal caliber with
atherosclerotic calcification. There is no retroperitoneal or
periportal lymphadenopathy. No pelvic lymphadenopathy.

Reproductive: Uterus and ovaries normal.

Other: No free fluid.

Musculoskeletal: Degenerative endplate change in the lumbar spine
with large Schmorl's nodes the endplate osteophytosis and sclerosis.
No change from comparison CT
IMPRESSION: Chest Impression:

1. Mild RIGHT basilar atelectasis and small RIGHT effusion.
2. Increased skin thickening in the lateral RIGHT breast. Recommend
clinical correlation.
3. Stable seroma in the RIGHT breast.

Abdomen / Pelvis Impression:

1. No acute findings in the abdomen pelvis.
2. Nonobstructing calculi in the LEFT renal pelvis.
3. Degenerative endplate change in the lumbar spine similar to
comparison CT 7256.
4. Coronary artery calcification and Aortic Atherosclerosis
([id number]).

## 2020-12-02 IMAGING — CT CT ABD-PELV W/O CM
2 of 4 series · 14 of 36 positions shown, 17 images · non-contrast
Comparison: CT abdomen 05/21/2018

CLINICAL DATA: Epigastric pain. RIGHT breast cancer with lumpectomy
and radiation.

EXAM:
CT CHEST, ABDOMEN AND PELVIS WITHOUT CONTRAST
TECHNIQUE: Multidetector CT imaging of the chest, abdomen and pelvis was
performed following the standard protocol without IV contrast.

[Series 2: axial st · axial · 0.93mm/px · z∈[-622,-72]mm · 11 of 130 slices shown, 14 images]
[im 10/130  mediastinal]
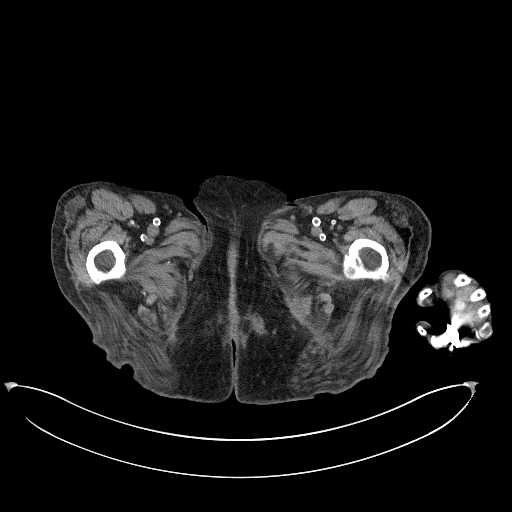
[im 10/130  lung]
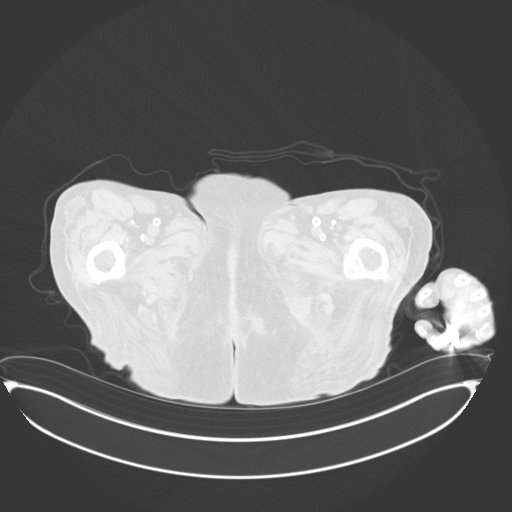
[im 20/130  lung]
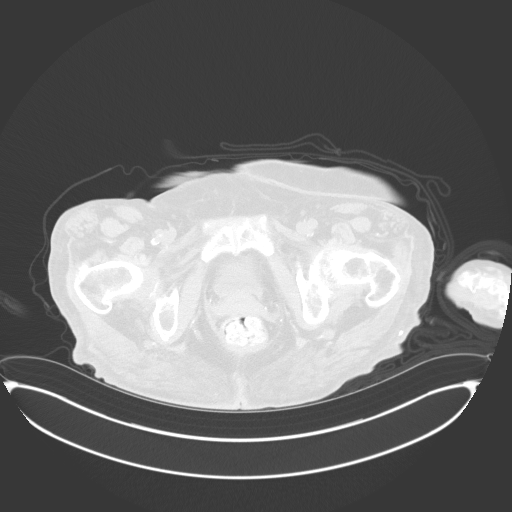
[im 30/130  lung]
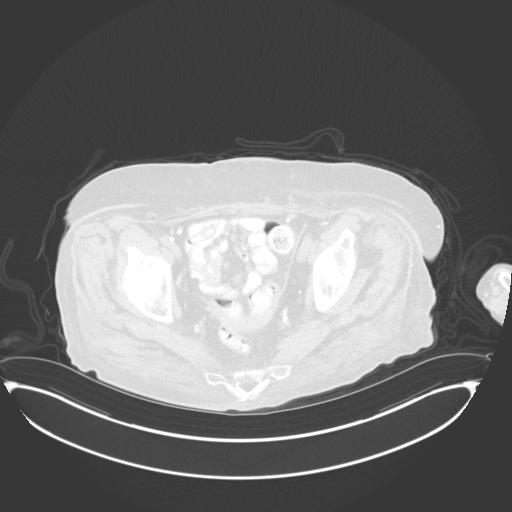
[im 40/130  lung]
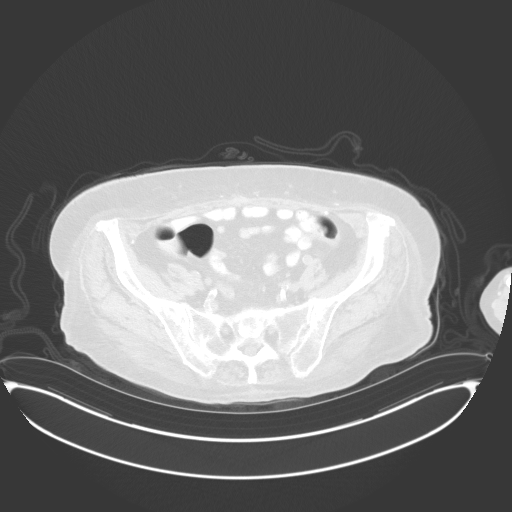
[im 50/130  mediastinal]
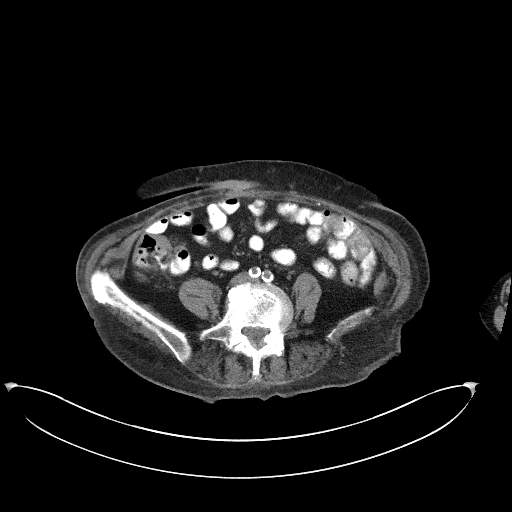
[im 50/130  lung]
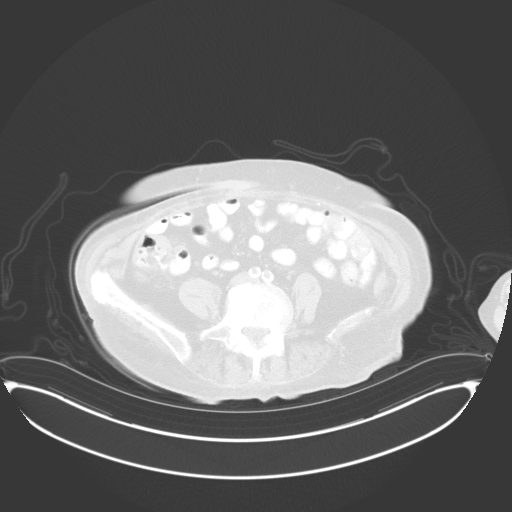
[im 70/130  lung]
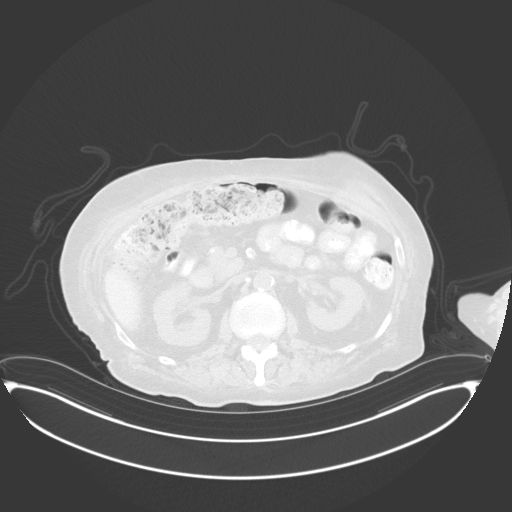
[im 80/130  lung]
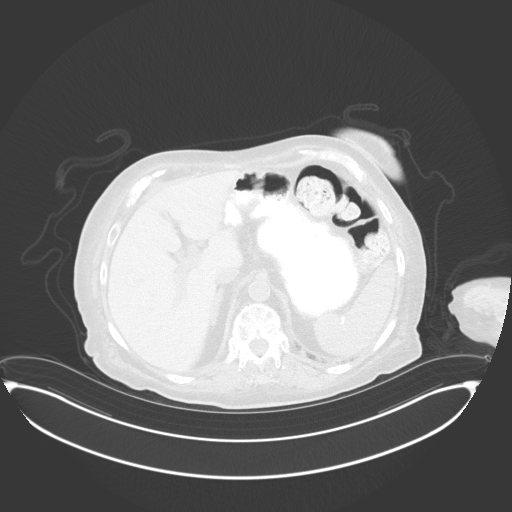
[im 90/130  lung]
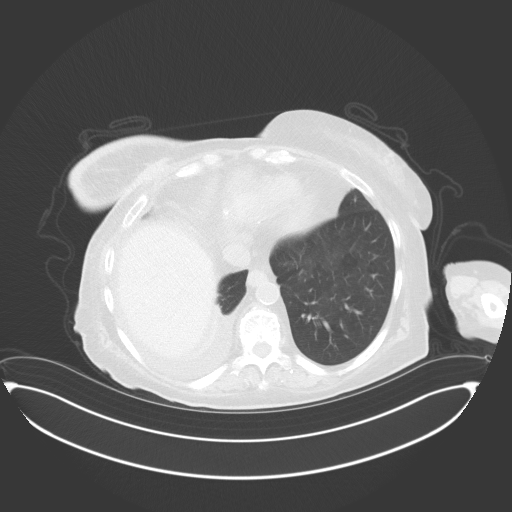
[im 100/130  mediastinal]
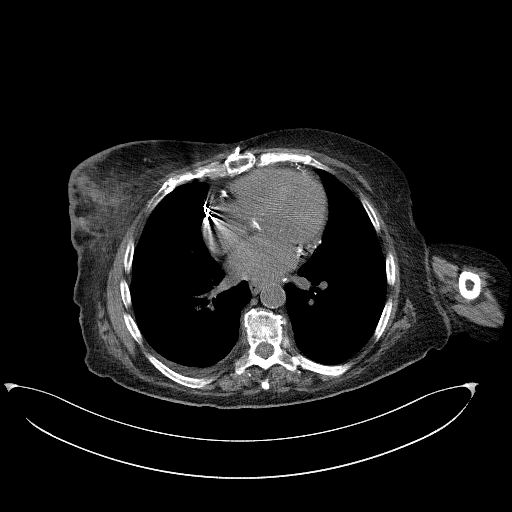
[im 100/130  lung]
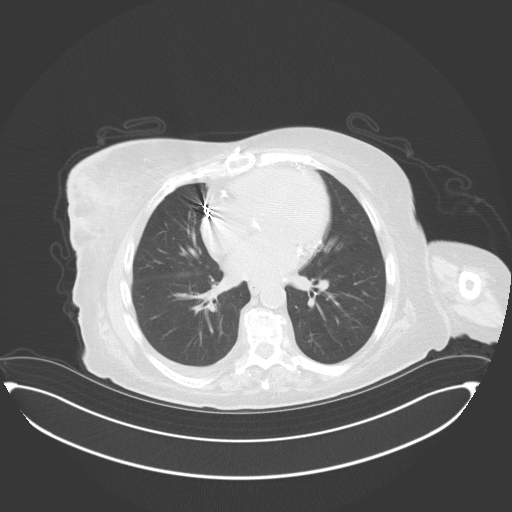
[im 110/130  lung]
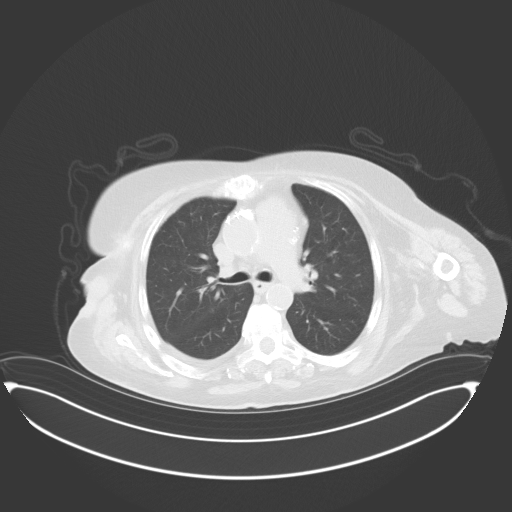
[im 120/130  lung]
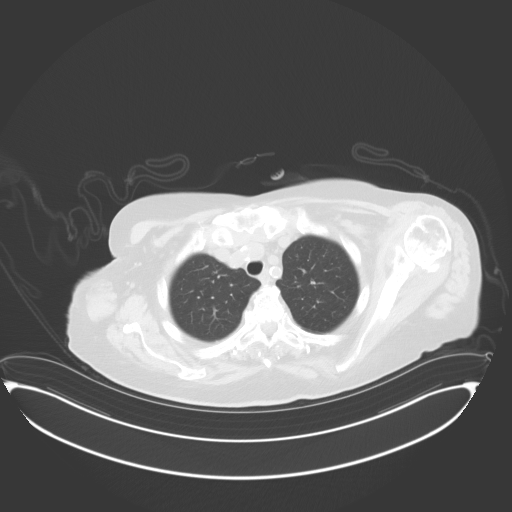

[Series 5: coronal · coronal · 0.88mm/px · 3 of 150 slices shown]
[im 30/150  lung]
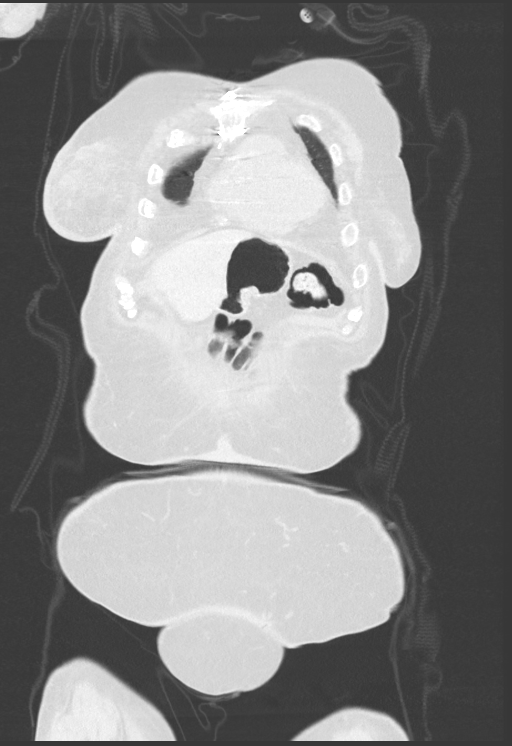
[im 60/150  lung]
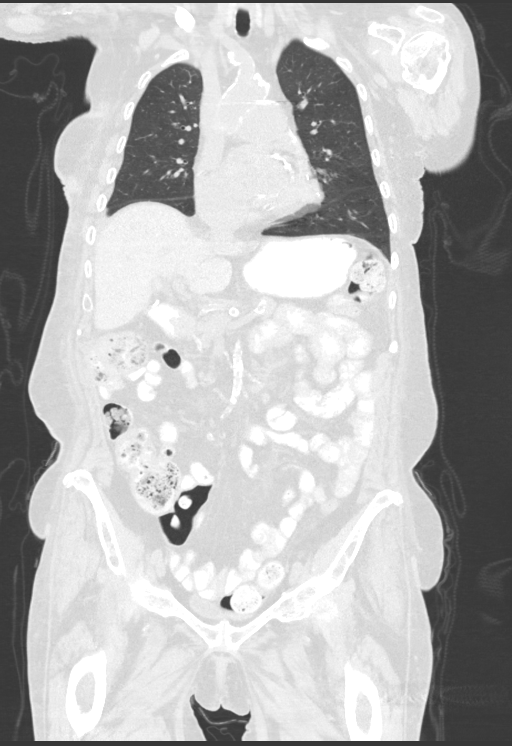
[im 90/150  lung]
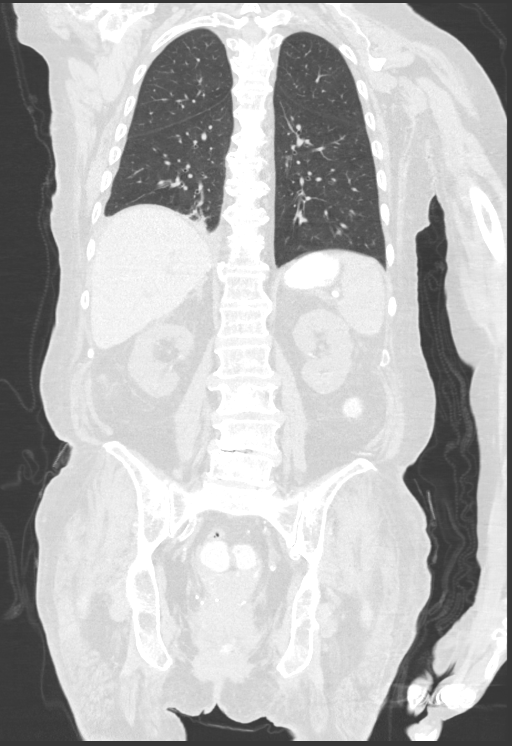

[14 of 36 positions shown; findings below may reference images not displayed]

FINDINGS: CT CHEST FINDINGS

Cardiovascular: Coronary artery calcification and aortic
atherosclerotic calcification. Post aortic valve replacement.

Mediastinum/Nodes: No mediastinal lymphadenopathy. No hilar
adenopathy. No axillary adenopathy.

Lungs/Pleura: No suspicious pulmonary nodules. Mild atelectasis at
the RIGHT lung base. Small RIGHT effusion.

Musculoskeletal: The seroma in the RIGHT breast measuring 3.6 x
cm not changed from comparison exam. There is skin thickening of the
RIGHT breast (image 33/2 which is similar to slightly progressed
from comparison exam.

CT ABDOMEN AND PELVIS FINDINGS

Hepatobiliary: No focal hepatic lesion. Postcholecystectomy. No
biliary dilatation.

Pancreas: Calcifications in the LEFT renal pelvis without renal
obstruction (image 85/5. Nonobstructing calculus in the RIGHT
kidney. No ureterolithiasis or obstructive uropathy. No bladder
calculi

Spleen: Normal spleen

Adrenals/urinary tract: Adrenal glands and kidneys are normal. The
ureters and bladder normal.

Stomach/Bowel: Stomach, small bowel, appendix, and cecum are normal.
The colon and rectosigmoid colon are normal.

Vascular/Lymphatic: Abdominal aorta is normal caliber with
atherosclerotic calcification. There is no retroperitoneal or
periportal lymphadenopathy. No pelvic lymphadenopathy.

Reproductive: Uterus and ovaries normal.

Other: No free fluid.

Musculoskeletal: Degenerative endplate change in the lumbar spine
with large Schmorl's nodes the endplate osteophytosis and sclerosis.
No change from comparison CT
IMPRESSION: Chest Impression:

1. Mild RIGHT basilar atelectasis and small RIGHT effusion.
2. Increased skin thickening in the lateral RIGHT breast. Recommend
clinical correlation.
3. Stable seroma in the RIGHT breast.

Abdomen / Pelvis Impression:

1. No acute findings in the abdomen pelvis.
2. Nonobstructing calculi in the LEFT renal pelvis.
3. Degenerative endplate change in the lumbar spine similar to
comparison CT 7256.
4. Coronary artery calcification and Aortic Atherosclerosis
([id number]).

## 2020-12-05 ENCOUNTER — Other Ambulatory Visit: Payer: Self-pay | Admitting: Internal Medicine

## 2020-12-09 ENCOUNTER — Other Ambulatory Visit: Payer: Self-pay

## 2020-12-09 ENCOUNTER — Inpatient Hospital Stay: Payer: Medicare Other | Attending: Hematology and Oncology

## 2020-12-09 VITALS — BP 141/43 | HR 79 | Temp 98.0°F | Resp 16 | Wt 143.0 lb

## 2020-12-09 DIAGNOSIS — D631 Anemia in chronic kidney disease: Secondary | ICD-10-CM | POA: Insufficient documentation

## 2020-12-09 DIAGNOSIS — I129 Hypertensive chronic kidney disease with stage 1 through stage 4 chronic kidney disease, or unspecified chronic kidney disease: Secondary | ICD-10-CM | POA: Diagnosis present

## 2020-12-09 DIAGNOSIS — N189 Chronic kidney disease, unspecified: Secondary | ICD-10-CM | POA: Insufficient documentation

## 2020-12-09 DIAGNOSIS — Z79899 Other long term (current) drug therapy: Secondary | ICD-10-CM | POA: Diagnosis not present

## 2020-12-09 DIAGNOSIS — D638 Anemia in other chronic diseases classified elsewhere: Secondary | ICD-10-CM

## 2020-12-09 MED ORDER — SODIUM CHLORIDE 0.9 % IV SOLN
200.0000 mg | Freq: Once | INTRAVENOUS | Status: AC
Start: 2020-12-09 — End: 2020-12-09
  Administered 2020-12-09: 200 mg via INTRAVENOUS
  Filled 2020-12-09: qty 200

## 2020-12-09 MED ORDER — FREESTYLE LIBRE 2 READER DEVI: 0 refills | Status: AC

## 2020-12-09 MED ORDER — SODIUM CHLORIDE 0.9 % IV SOLN
Freq: Once | INTRAVENOUS | Status: AC
  Filled 2020-12-09: qty 250

## 2020-12-09 MED ORDER — FREESTYLE LIBRE 2 SENSOR MISC: 2 refills | Status: DC

## 2020-12-09 NOTE — Patient Instructions (Signed)

## 2020-12-13 NOTE — Telephone Encounter (Signed)
Rx was sent to [facility] today for Taylor 2 Reader and sensor.

## 2020-12-14 ENCOUNTER — Ambulatory Visit: Payer: Medicare Other

## 2020-12-14 ENCOUNTER — Other Ambulatory Visit: Payer: Medicare Other

## 2020-12-16 ENCOUNTER — Other Ambulatory Visit: Payer: Self-pay

## 2020-12-16 ENCOUNTER — Other Ambulatory Visit: Payer: Medicare Other

## 2020-12-16 ENCOUNTER — Inpatient Hospital Stay: Payer: Medicare Other

## 2020-12-16 ENCOUNTER — Ambulatory Visit: Payer: Medicare Other

## 2020-12-16 VITALS — BP 151/73 | HR 70 | Temp 98.2°F | Resp 18

## 2020-12-16 DIAGNOSIS — I129 Hypertensive chronic kidney disease with stage 1 through stage 4 chronic kidney disease, or unspecified chronic kidney disease: Secondary | ICD-10-CM | POA: Diagnosis not present

## 2020-12-16 DIAGNOSIS — D638 Anemia in other chronic diseases classified elsewhere: Secondary | ICD-10-CM

## 2020-12-16 MED ORDER — SODIUM CHLORIDE 0.9 % IV SOLN
Freq: Once | INTRAVENOUS | Status: AC
Start: 2020-12-16 — End: 2020-12-16
  Filled 2020-12-16: qty 250

## 2020-12-16 MED ORDER — SODIUM CHLORIDE 0.9 % IV SOLN
200.0000 mg | Freq: Once | INTRAVENOUS | Status: AC
  Administered 2020-12-16: 200 mg via INTRAVENOUS
  Filled 2020-12-16: qty 200

## 2020-12-16 NOTE — Patient Instructions (Signed)

## 2020-12-18 IMAGING — CT CT SHOULDER*L* W/O CM
1 series · 12 of 14 positions shown, 15 images · non-contrast
Comparison: Radiographs from 03/26/2019

CLINICAL DATA: Fall 3 weeks ago. Erosive arthritis. Reduced range
of motion.

EXAM:
CT OF THE UPPER LEFT EXTREMITY WITHOUT CONTRAST
TECHNIQUE: Multidetector CT imaging of the upper left extremity was performed
according to the standard protocol.

[Series 2: shoulder 2.00 br40 s3 axial soft · axial · 0.45mm/px · z∈[-922,-776]mm · 12 of 87 slices shown, 15 images]
[im 7/87  soft-tissue]
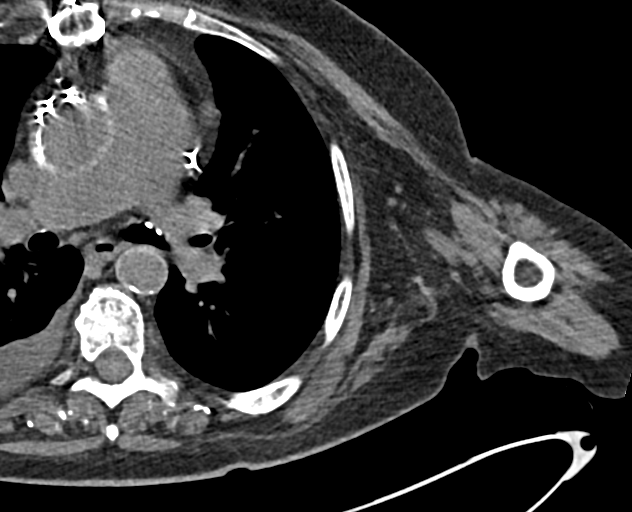
[im 7/87  bone]
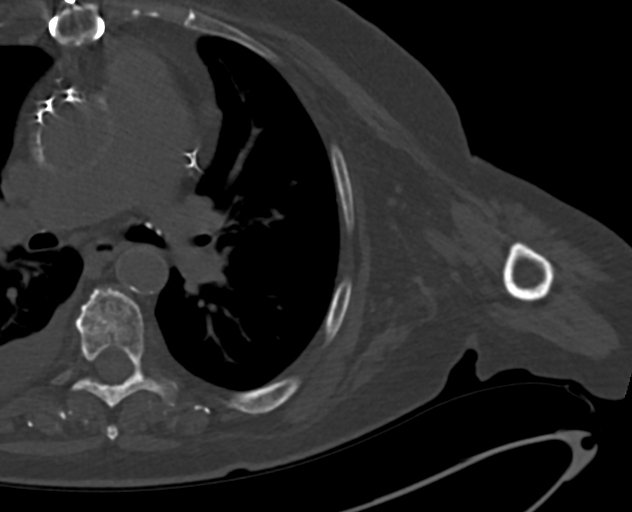
[im 14/87  bone]
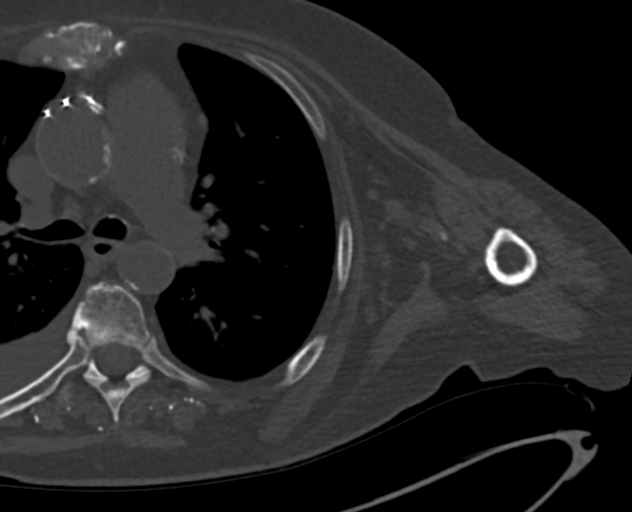
[im 20/87  bone]
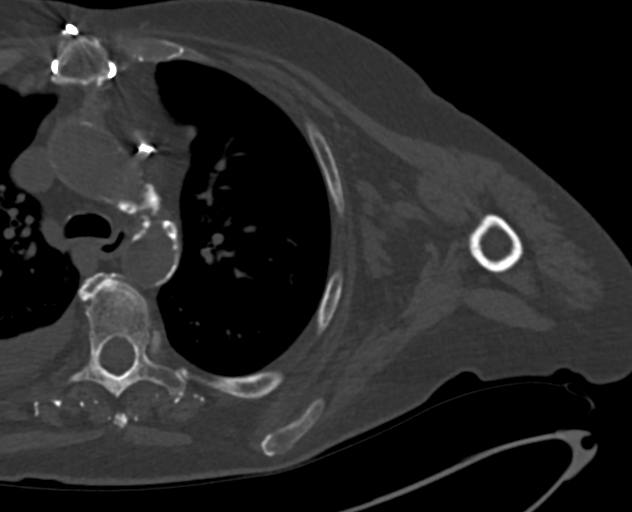
[im 27/87  bone]
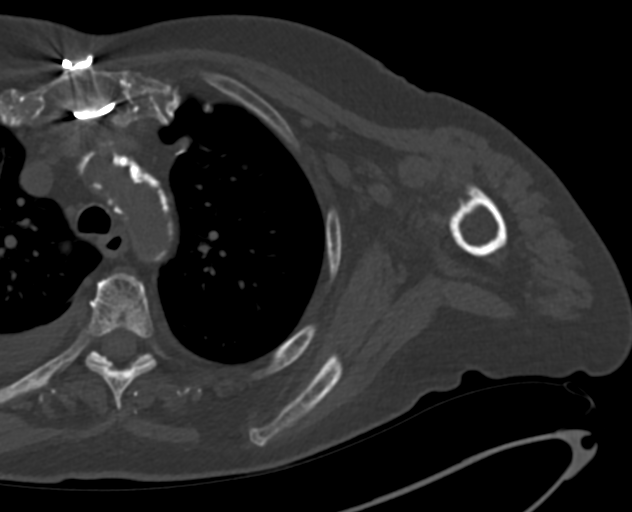
[im 34/87  soft-tissue]
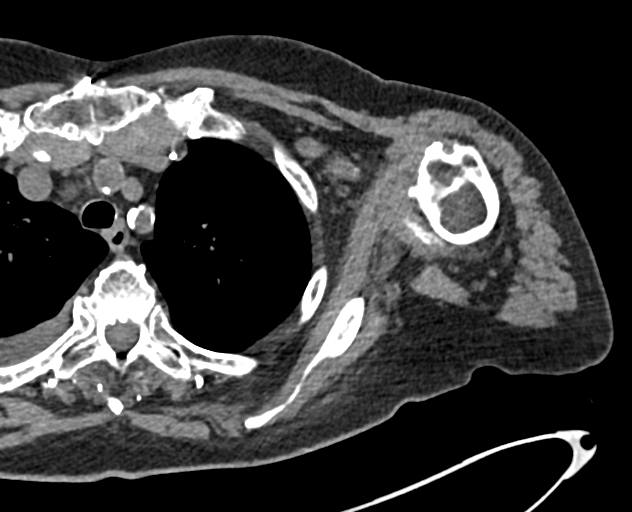
[im 34/87  bone]
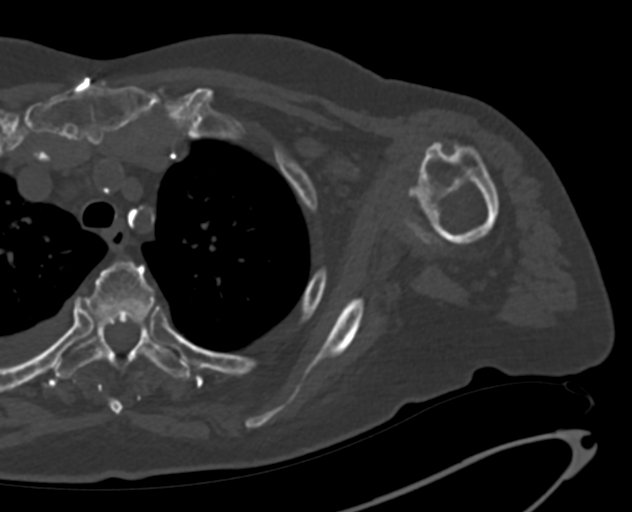
[im 40/87  bone]
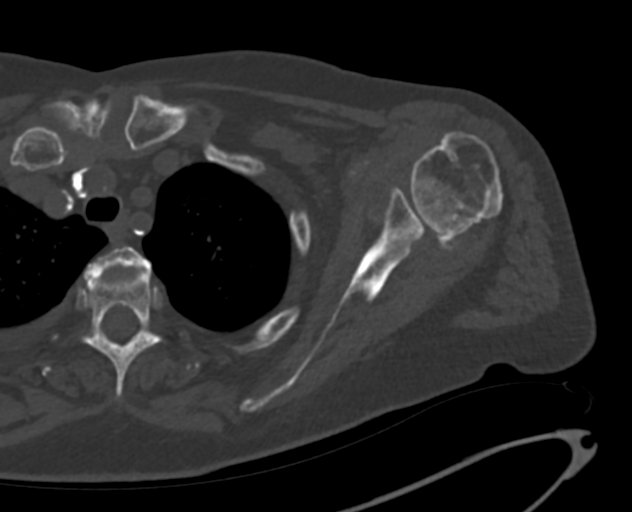
[im 47/87  bone]
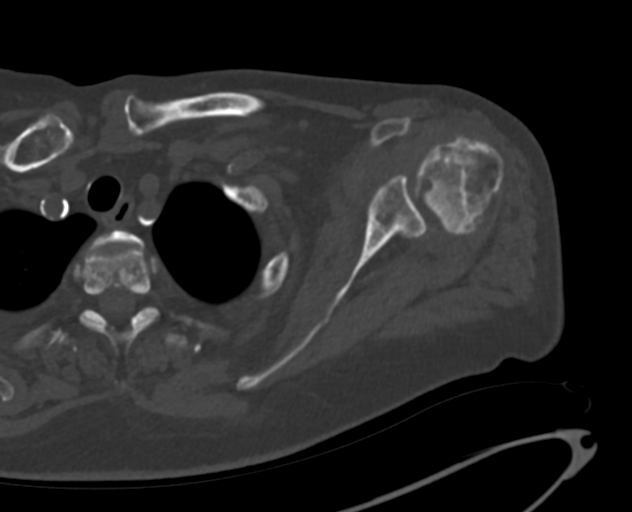
[im 53/87  bone]
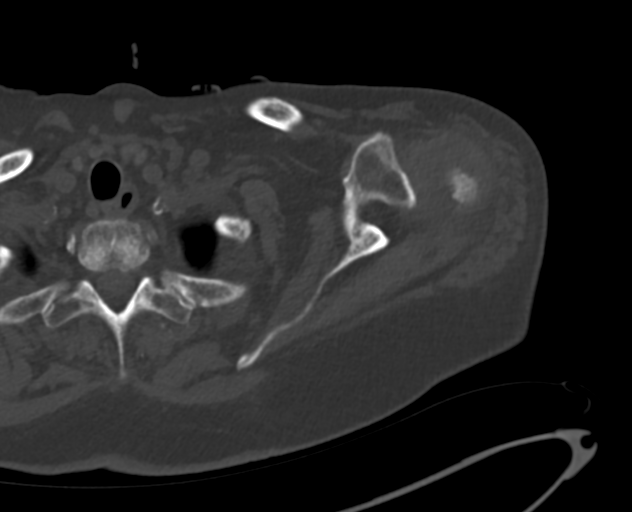
[im 60/87  soft-tissue]
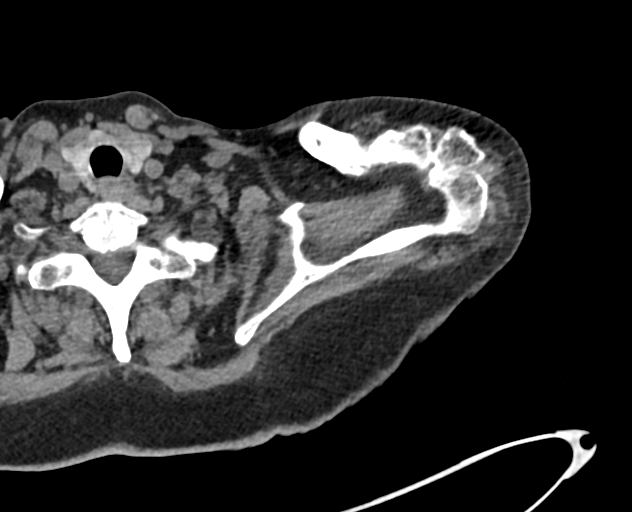
[im 60/87  bone]
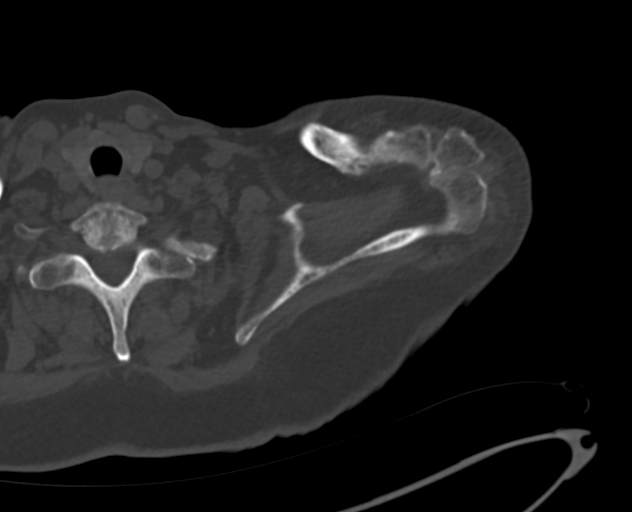
[im 67/87  bone]
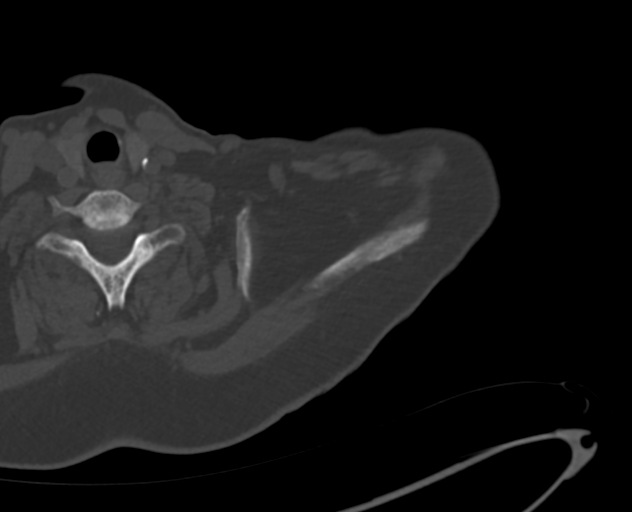
[im 73/87  bone]
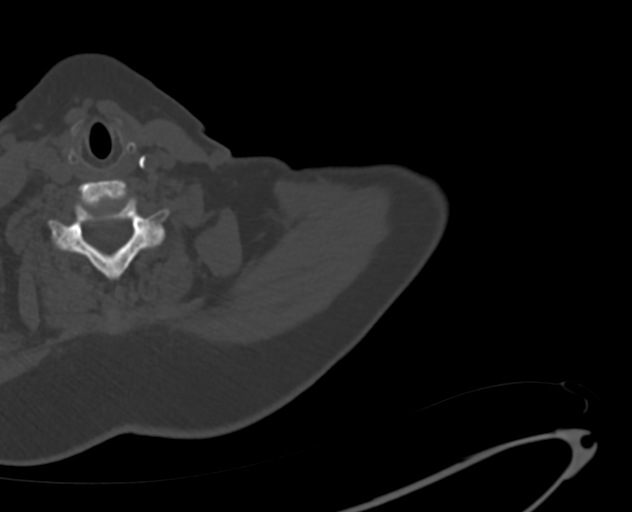
[im 80/87  bone]
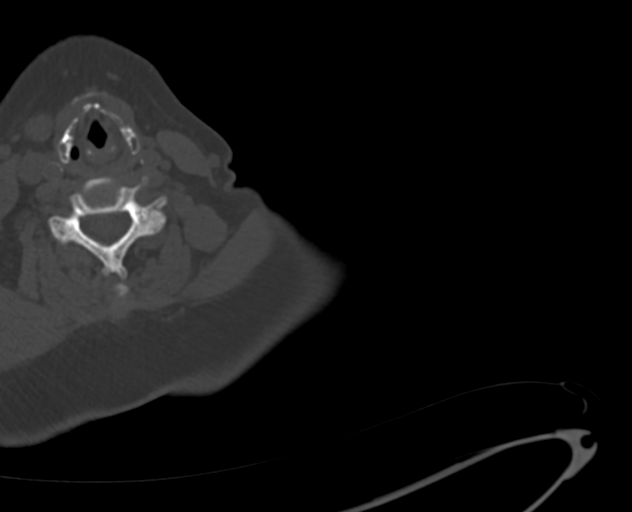

[12 of 14 positions shown; findings below may reference images not displayed]

FINDINGS: Bones/Joint/Cartilage

Flattening and collapse of the medial portion of the left humeral
head noted with sclerotic margin along the zone of flattening
suspicious for a vascular necrosis or possibly prior fracture. The
result is a blunted curvature of the humeral head with a more club-
like appearance rather than a spherical appearance of the proximal
contour. There is bony irregularity and discontinuity along the
margins of the flattened zone causing a spur like rim around the
area flattening, but this margin is corticated and probably chronic.

Faint calcifications are present in the glenohumeral joint and
subscapular recess probably from synovitis and small free
osteochondral fragments.

There is spurring and irregularity along the proximal biceps recess
margin.

Mesoacromial os acromiale, well corticated.

Prior median sternotomy.

Ligaments

Suboptimally assessed by CT.

Muscles and Tendons

No disproportionate atrophy of specific components of the rotator
cuff musculature.

Soft tissues

Soft tissue density in the joint likely from synovitis, cannot
exclude blood products or proteinaceous fluid in the glenohumeral
joint.

Coronary, aortic arch, and branch vessel atherosclerotic vascular
disease. Prior CABG. Small to moderate right pleural effusion,
partially imaged. On the scout image there is a prosthetic aortic
valve and what appears to be a loop recorder. In addition, there
appears to be deformity and arthropathy of the contralateral (right)
humeral head.
IMPRESSION: 1. Flattening and collapse of the medial portion of the left humeral
head, with bony irregularity/spurring along the margins of the
flattened zone. This appearance could be from a vascular necrosis or
possibly a prior fracture.
2. Accentuated density in the glenohumeral joint probably from
synovitis, with some faint calcifications which could be from
synovial metaplasia or small free osteochondral fragments.
3. Mesoacromial os acromiale.
4. Deformity and arthropathy of the contralateral (right) humeral
head on the scout image.
5. Aortic Atherosclerosis ([id number]). Coronary atherosclerosis
with prior CABG. Aortic valve prosthesis.
6. Small to moderate right pleural effusion, partially imaged.

## 2020-12-21 ENCOUNTER — Encounter (HOSPITAL_COMMUNITY): Payer: Self-pay

## 2020-12-23 ENCOUNTER — Inpatient Hospital Stay: Payer: Medicare Other

## 2020-12-23 ENCOUNTER — Other Ambulatory Visit: Payer: Self-pay

## 2020-12-23 VITALS — BP 128/84 | HR 75 | Temp 98.4°F | Resp 18

## 2020-12-23 DIAGNOSIS — I129 Hypertensive chronic kidney disease with stage 1 through stage 4 chronic kidney disease, or unspecified chronic kidney disease: Secondary | ICD-10-CM | POA: Diagnosis not present

## 2020-12-23 DIAGNOSIS — D638 Anemia in other chronic diseases classified elsewhere: Secondary | ICD-10-CM

## 2020-12-23 MED ORDER — SODIUM CHLORIDE 0.9 % IV SOLN
INTRAVENOUS | Status: DC
  Filled 2020-12-23: qty 250

## 2020-12-23 MED ORDER — SODIUM CHLORIDE 0.9 % IV SOLN
200.0000 mg | Freq: Once | INTRAVENOUS | Status: AC
  Administered 2020-12-23: 200 mg via INTRAVENOUS
  Filled 2020-12-23: qty 200

## 2020-12-23 NOTE — Patient Instructions (Signed)

## 2021-01-03 ENCOUNTER — Other Ambulatory Visit: Payer: Self-pay | Admitting: Endocrinology

## 2021-01-03 ENCOUNTER — Telehealth: Payer: Self-pay | Admitting: Endocrinology

## 2021-01-03 DIAGNOSIS — Z794 Long term (current) use of insulin: Secondary | ICD-10-CM

## 2021-01-03 DIAGNOSIS — E1165 Type 2 diabetes mellitus with hyperglycemia: Secondary | ICD-10-CM

## 2021-01-03 NOTE — Telephone Encounter (Signed)
Patient received Freestyle Libre 2 today and needs help with it, she has no idea what to do with it.

## 2021-01-04 NOTE — Telephone Encounter (Signed)
Scheduled patient

## 2021-01-04 NOTE — Telephone Encounter (Signed)
Scheduled patient for [facility] training

## 2021-01-06 IMAGING — DX DG CHEST 2V
2 series · 2 of 2 positions shown · non-contrast
Comparison: Chest radiograph dated 03/04/2019.

CLINICAL DATA: 73-year-old female with pleural effusion follow-up.

EXAM:
CHEST - 2 VIEW

[dg chest 2 view (1 of 2)]
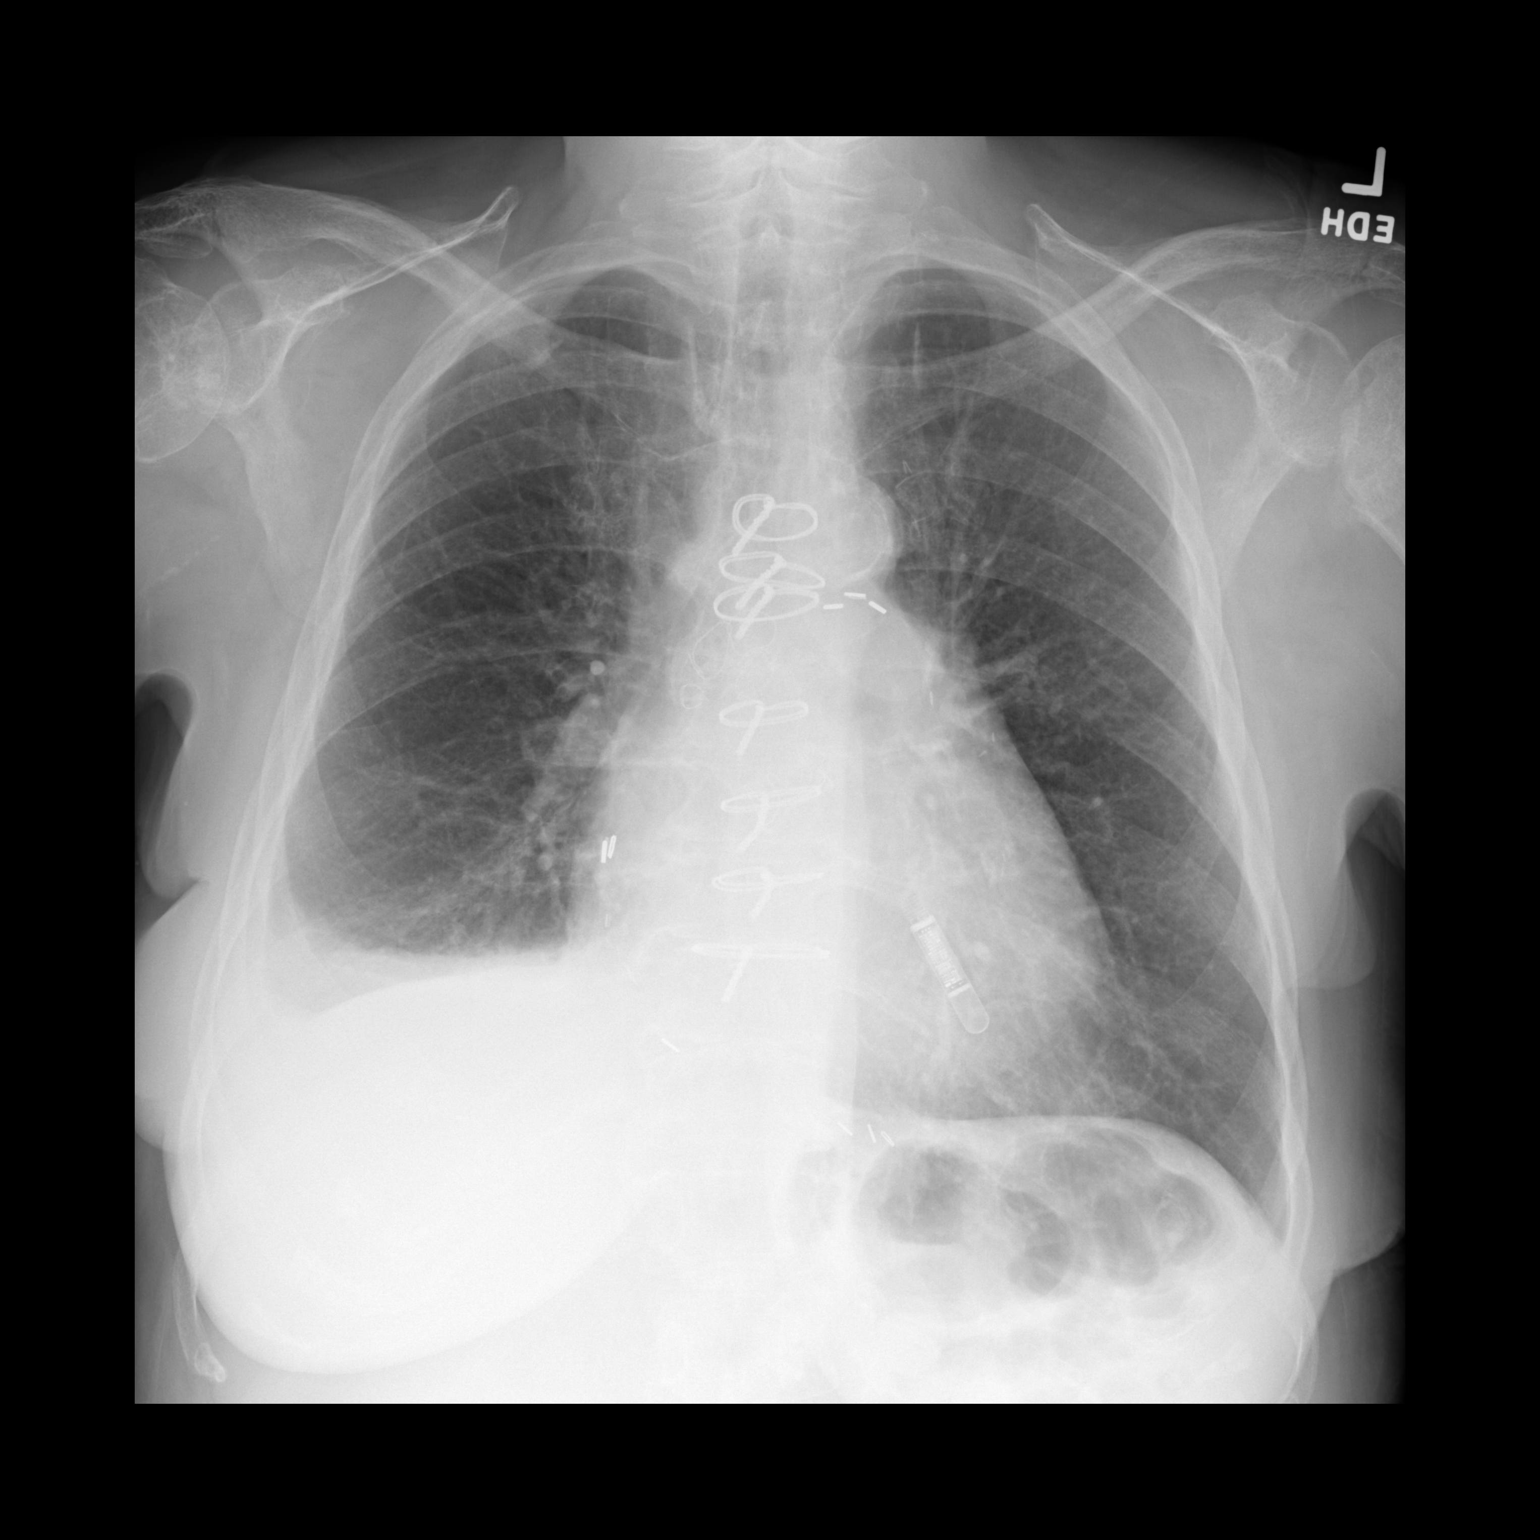

[dg chest 2 view (2 of 2)]
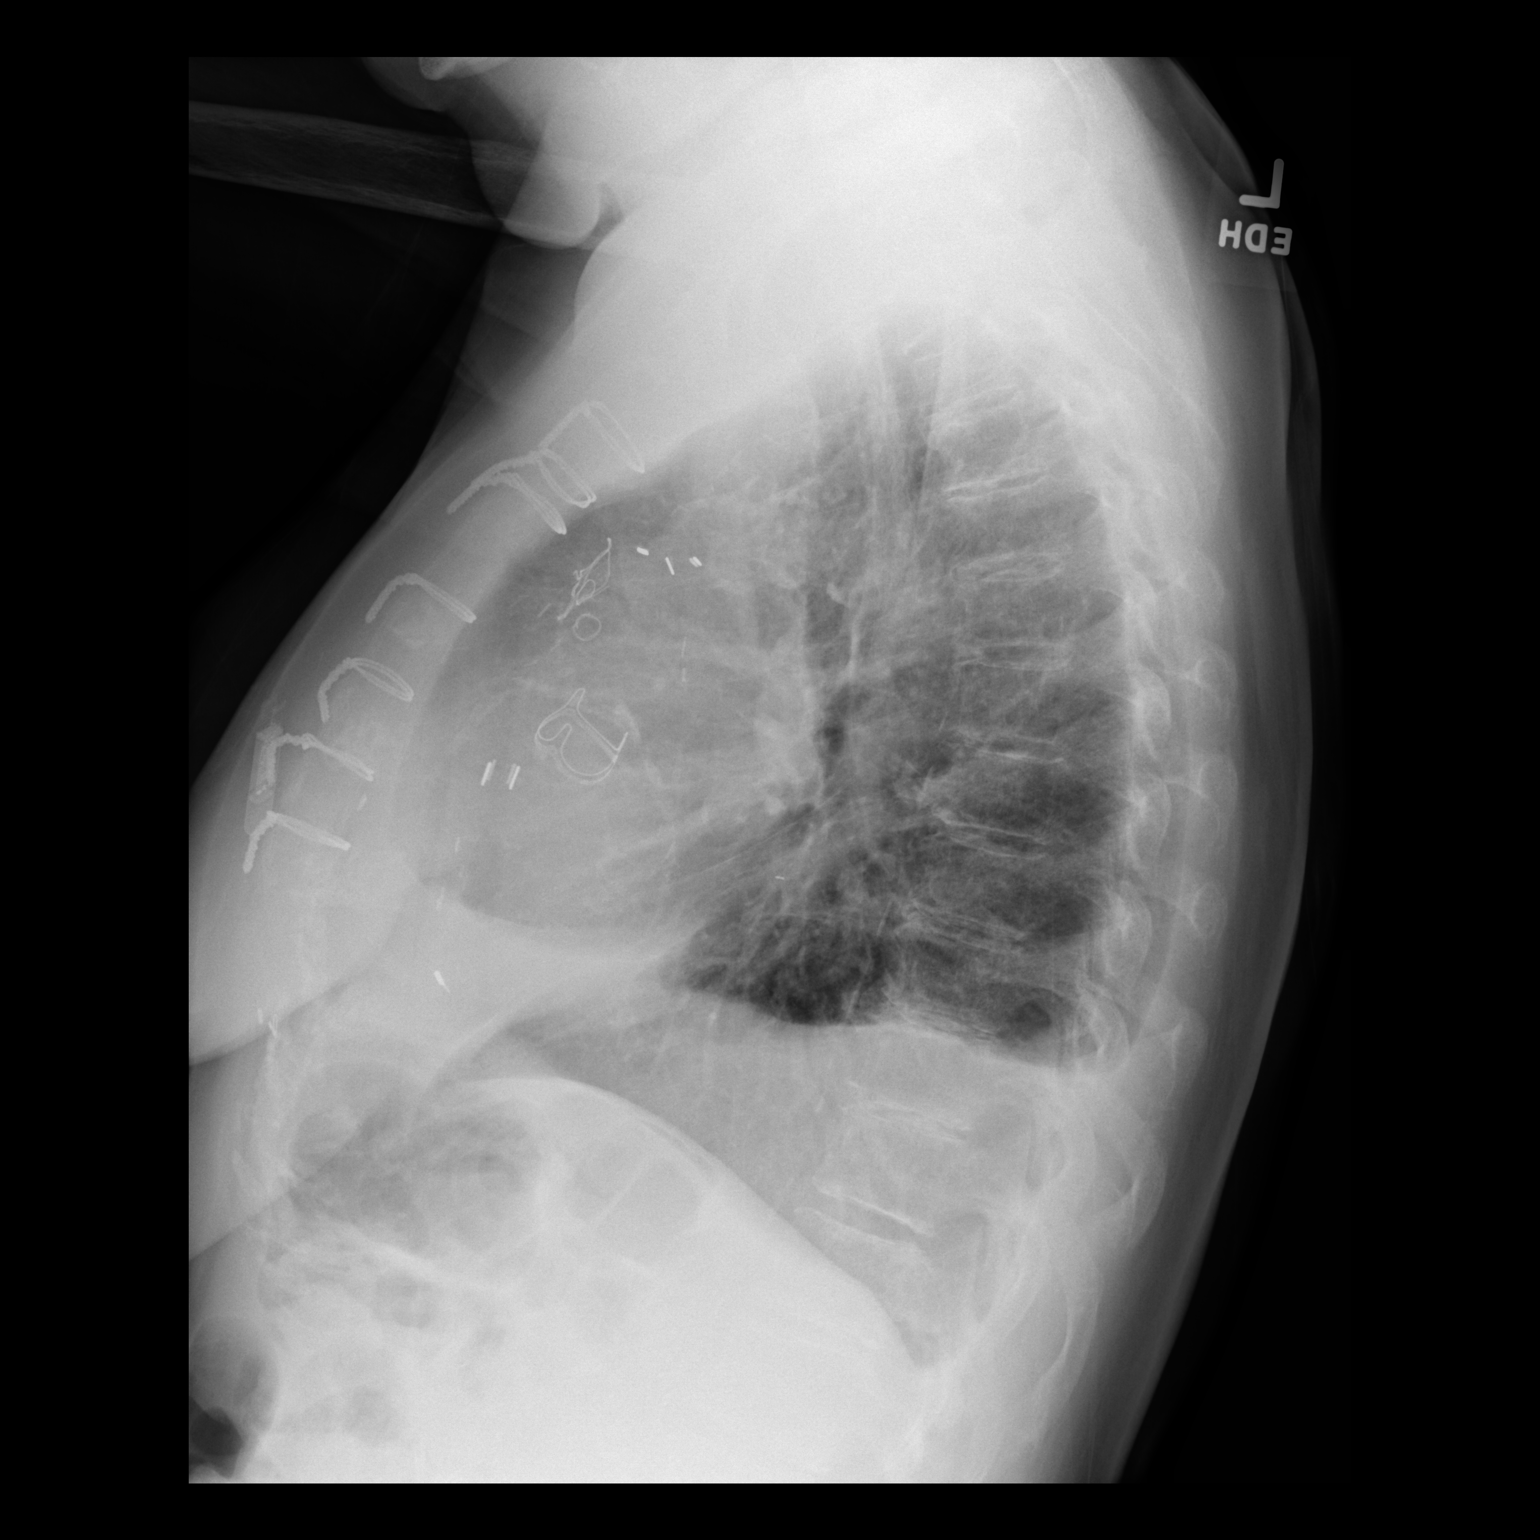

[2 of 2 positions shown; findings below may reference images not displayed]

FINDINGS: Small right pleural effusion, increased since the prior radiograph.
There is associated compressive atelectasis of the right lung base.
Pneumonia is not excluded. Clinical correlation and follow-up
recommended. The left lung is clear. There is no pneumothorax. Mild
cardiomegaly. Median sternotomy wires and CABG vascular clips. Loop
recorder device. Mechanical aortic valve. No acute osseous
pathology.
IMPRESSION: 1. Small right pleural effusion, increased since the prior
radiograph.
2. Mild cardiomegaly.
3. Mechanical aortic valve.

## 2021-01-10 ENCOUNTER — Other Ambulatory Visit: Payer: Self-pay

## 2021-01-10 ENCOUNTER — Encounter: Payer: Medicare Other | Attending: Endocrinology | Admitting: Nutrition

## 2021-01-10 ENCOUNTER — Encounter: Payer: Medicare Other | Admitting: Nutrition

## 2021-01-10 DIAGNOSIS — E1165 Type 2 diabetes mellitus with hyperglycemia: Secondary | ICD-10-CM | POA: Diagnosis not present

## 2021-01-11 NOTE — Progress Notes (Signed)
Patient is here with her son, to learn how to use the [place] CGM.  They were instructed on how to load the sensor, where to inject, and how to read the sensor, and how often needed to do this to get a full 24 hour sensor reading.  They reported good understanding of this The chose the phone to be the reader, and this was linked to [place] with their permission They had no final questions.

## 2021-01-11 NOTE — Patient Instructions (Signed)
Apply a new sensor every 14 days Scan sensor every morning and last thing before bed, and on other time during the day. REad over instruction manual Call 800 help line if questions

## 2021-01-13 IMAGING — CR DG CHEST 2V
2 series · 2 of 2 positions shown · non-contrast
Comparison: Chest radiograph dated 04/21/2019.

CLINICAL DATA: 73-year-old female with shortness of breath.
Right-sided pleural effusion.

EXAM:
CHEST - 2 VIEW

[w chest pa]
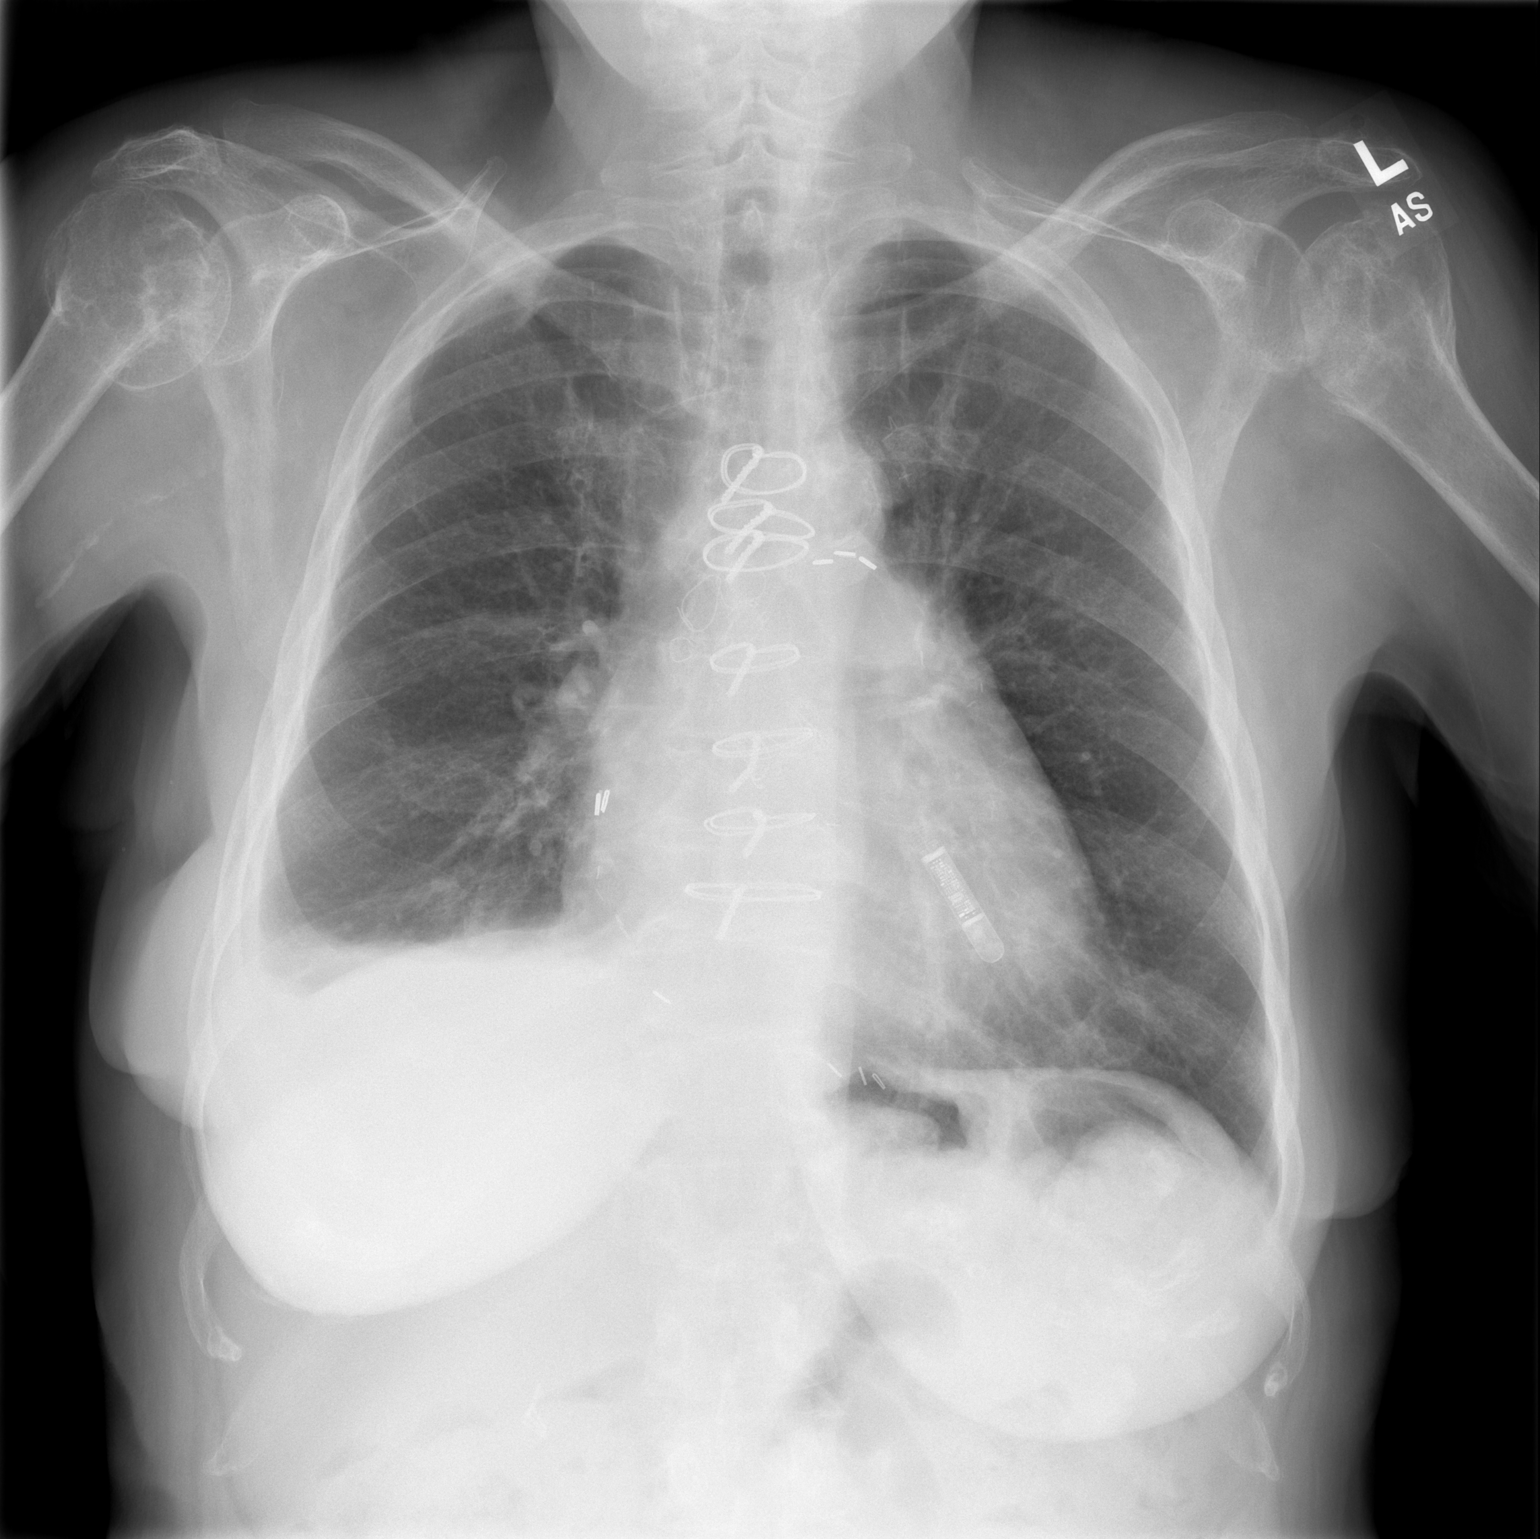

[w chest lat *]
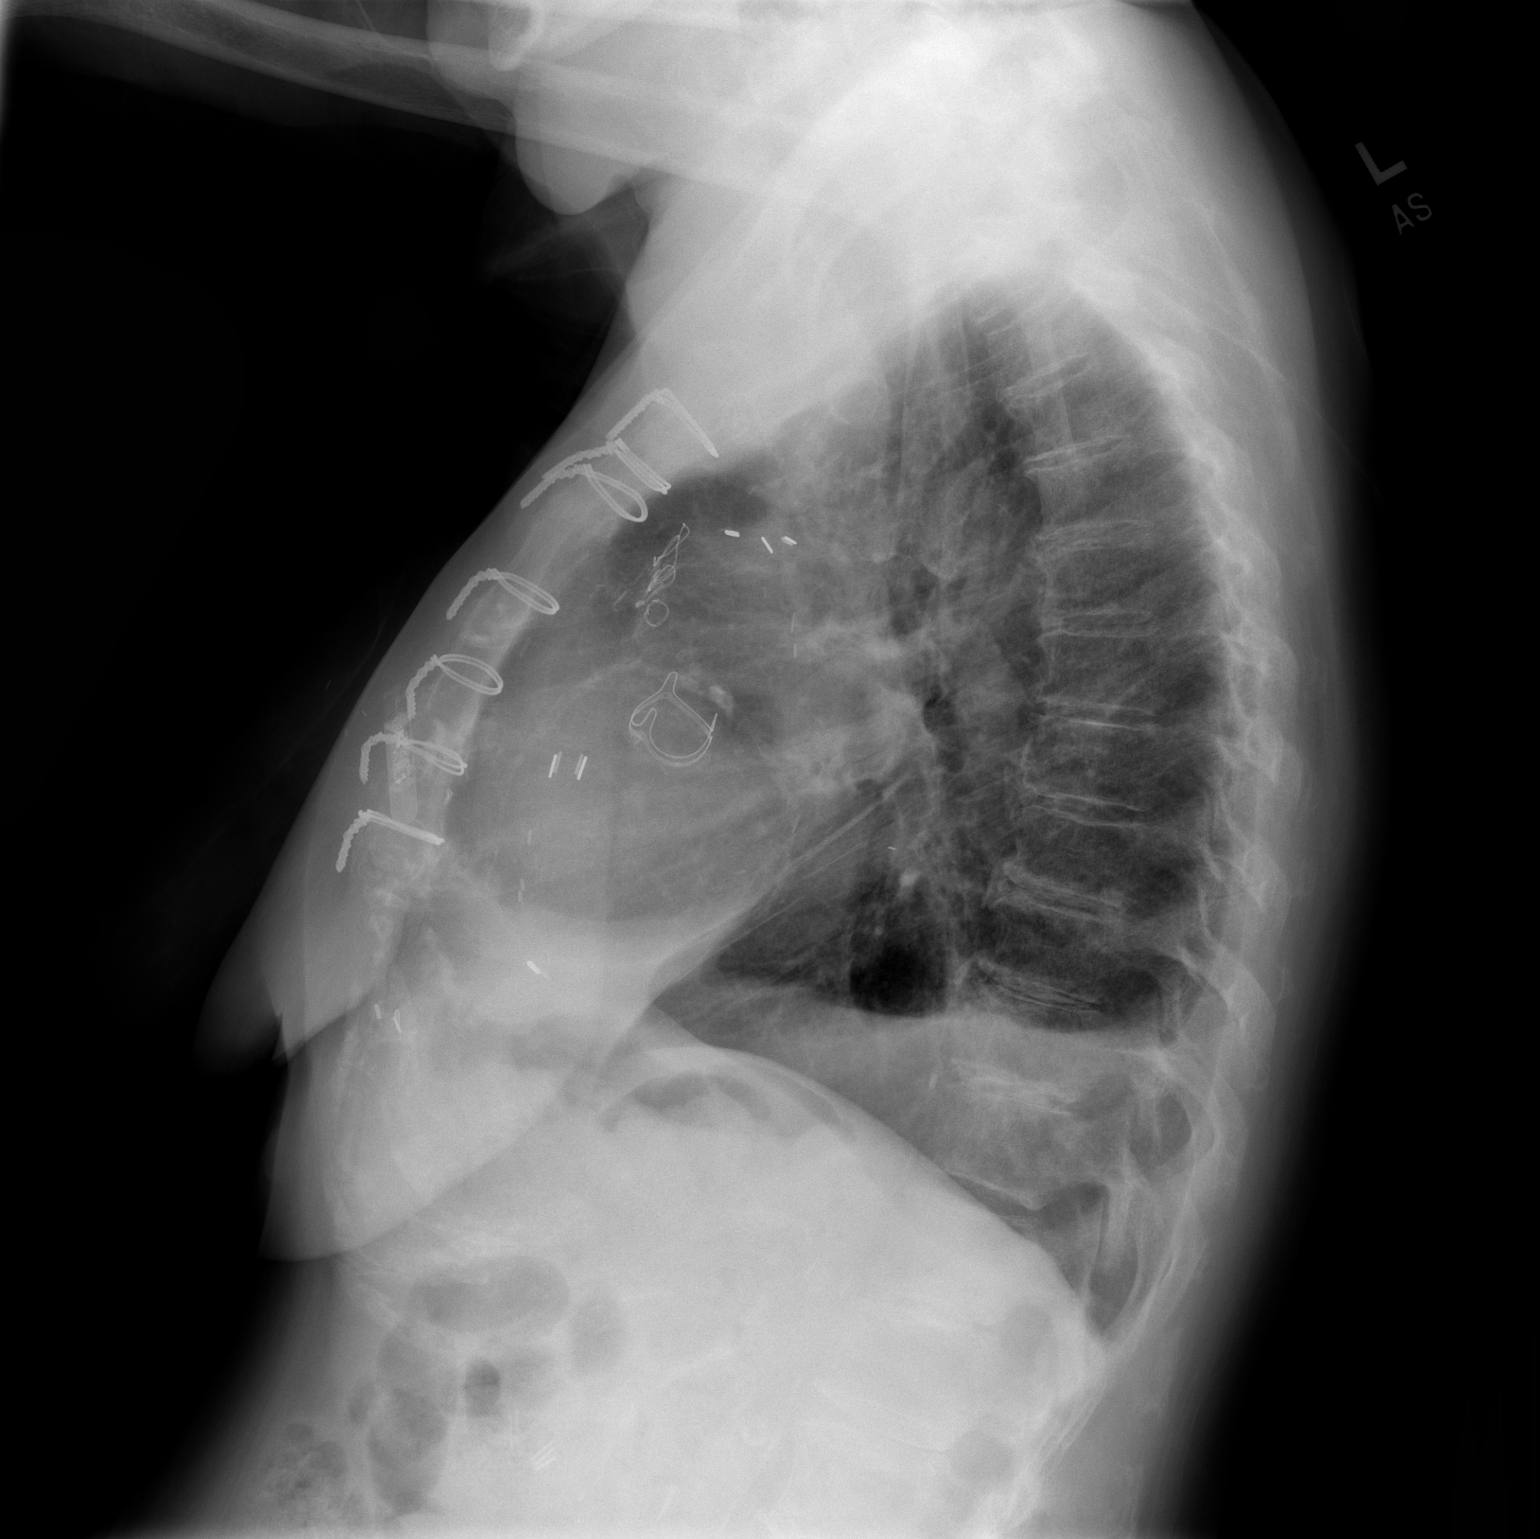

[2 of 2 positions shown; findings below may reference images not displayed]

FINDINGS: Small right pleural effusion with no significant interval change
since the prior radiograph. The left lung is clear. There is no
pneumothorax. Stable mild cardiomegaly. Atherosclerotic
calcification of the aortic arch. Median sternotomy wires, CABG
vascular clips, mechanical heart valve, and a loop recorder device
noted. No acute osseous pathology. Degenerative changes of the spine
and old healed bilateral humeral neck fractures.
IMPRESSION: Stable small right pleural effusion, similar in size to the study of
04/21/2019.

## 2021-01-16 ENCOUNTER — Other Ambulatory Visit: Payer: Medicare Other

## 2021-01-16 ENCOUNTER — Ambulatory Visit: Payer: Medicare Other | Admitting: Hematology and Oncology

## 2021-01-16 ENCOUNTER — Ambulatory Visit: Payer: Medicare Other

## 2021-01-19 ENCOUNTER — Other Ambulatory Visit: Payer: Self-pay

## 2021-01-19 ENCOUNTER — Inpatient Hospital Stay: Payer: Medicare Other | Attending: Adult Health

## 2021-01-19 DIAGNOSIS — Z853 Personal history of malignant neoplasm of breast: Secondary | ICD-10-CM | POA: Insufficient documentation

## 2021-01-19 DIAGNOSIS — D631 Anemia in chronic kidney disease: Secondary | ICD-10-CM | POA: Insufficient documentation

## 2021-01-19 DIAGNOSIS — D638 Anemia in other chronic diseases classified elsewhere: Secondary | ICD-10-CM

## 2021-01-19 DIAGNOSIS — R5383 Other fatigue: Secondary | ICD-10-CM | POA: Insufficient documentation

## 2021-01-19 DIAGNOSIS — Z79899 Other long term (current) drug therapy: Secondary | ICD-10-CM | POA: Insufficient documentation

## 2021-01-19 DIAGNOSIS — N189 Chronic kidney disease, unspecified: Secondary | ICD-10-CM | POA: Diagnosis not present

## 2021-01-19 DIAGNOSIS — I129 Hypertensive chronic kidney disease with stage 1 through stage 4 chronic kidney disease, or unspecified chronic kidney disease: Secondary | ICD-10-CM | POA: Insufficient documentation

## 2021-01-19 LAB — CBC WITH DIFFERENTIAL (CANCER CENTER ONLY)
Abs Immature Granulocytes: 0.02 10*3/uL (ref 0.00–0.07)
Basophils Absolute: 0.1 10*3/uL (ref 0.0–0.1)
Basophils Relative: 1 %
Eosinophils Absolute: 0.5 10*3/uL (ref 0.0–0.5)
Eosinophils Relative: 7 %
HCT: 27.1 % — ABNORMAL LOW (ref 36.0–46.0)
Hemoglobin: 8.4 g/dL — ABNORMAL LOW (ref 12.0–15.0)
Immature Granulocytes: 0 %
Lymphocytes Relative: 11 %
Lymphs Abs: 0.8 10*3/uL (ref 0.7–4.0)
MCH: 31.5 pg (ref 26.0–34.0)
MCHC: 31 g/dL (ref 30.0–36.0)
MCV: 101.5 fL — ABNORMAL HIGH (ref 80.0–100.0)
Monocytes Absolute: 0.8 10*3/uL (ref 0.1–1.0)
Monocytes Relative: 11 %
Neutro Abs: 5.1 10*3/uL (ref 1.7–7.7)
Neutrophils Relative %: 70 %
Platelet Count: 138 10*3/uL — ABNORMAL LOW (ref 150–400)
RBC: 2.67 MIL/uL — ABNORMAL LOW (ref 3.87–5.11)
RDW: 16 % — ABNORMAL HIGH (ref 11.5–15.5)
WBC Count: 7.2 10*3/uL (ref 4.0–10.5)
nRBC: 0 % (ref 0.0–0.2)

## 2021-01-19 NOTE — Progress Notes (Signed)
Patient Care Team: [name], DO as PCP - General [name], NP (Inactive) as PCP - Cardiology (Nurse Practitioner) Evans Lance, MD as PCP - Electrophysiology (Cardiology) Larey Dresser, MD as PCP - Advanced Heart Failure (Cardiology) Alphonsa Overall, MD as Consulting Physician (General Surgery) Nicholas Lose, MD as Consulting Physician (Hematology and Oncology) Gery Pray, MD as Consulting Physician (Radiation Oncology) Evans Lance, MD as Consulting Physician (Cardiology) Collene Gobble, MD as Consulting Physician (Pulmonary Disease) Elayne Snare, MD as Consulting Physician (Endocrinology) Delice Bison, Charlestine Massed, NP as Nurse Practitioner (Hematology and Oncology) Ardelle Balls., MD (Neurology) Izora Gala, MD as Consulting Physician (Otolaryngology) Trula Slade, DPM as Consulting Physician (Podiatry) Delice Lesch Lezlie Octave, MD as Consulting Physician (Neurology)  DIAGNOSIS:    ICD-10-CM   1. Anemia of chronic disease  D63.8 Sample to Blood Bank    Ferritin      SUMMARY OF ONCOLOGIC HISTORY: Oncology History  Breast cancer of upper-inner quadrant of right female breast ([place])  12/14/2015 Initial Diagnosis   Right breast biopsy 12:30 position: 2 masses, 1.6 cm mass: Invasive ductal carcinoma, grade 2, ER 0%, PR 0%, HER-2 negative, Ki-67 70%; satellite mass 8 mm: IDC grade 2 ER 5%, PR 5%, HER-2 negative, Ki-67 50%; T1cN0 stage IA clinical stage   01/18/2016 Surgery   Right lumpectomy: Multifocal IDC grade 3, 1.9 cm  ER 0%, PR 0%, HER-2 negative, Ki-67 70% and 0.8 cm satellite mass ER 5%, PR 2%, HER-2 negative, Ki-67 50%, high-grade DCIS, margins negative, 0/1 lymph nodes negative T1 cN0 stage IA   02/17/2016 - 04/06/2016 Chemotherapy   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 with above-mentioned history of breast cancer and severe anemia. She presents to the clinic today for follow-up.  She is here complaining of profound fatigue and generalized weakness.  She did not feel that the iron infusion has helped her much.  She is not able to do any household activities.  ALLERGIES:  is allergic to amoxicillin, tape, aldactone [spironolactone], isosorbide nitrate, k-flex [orphenadrine], arimidex [anastrozole], latex, and tetracycline.  MEDICATIONS:  Current Outpatient Medications  Medication Sig Dispense Refill   Accu-Chek FastClix Lancets MISC Use to check blood sugar 3 times per day 100 each 5   ACCU-CHEK GUIDE test strip Use to test blood sugar 3 times daily 150 strip 3   acetaminophen (TYLENOL) 500 MG tablet Take 1,000 mg by mouth as needed for moderate pain.     albuterol (PROVENTIL) (2.5 MG/3ML) 0.083% nebulizer solution Take 3 mLs (2.5 mg total) by nebulization every 6 (six) hours as needed for wheezing or shortness of breath. 75 mL 3   albuterol (VENTOLIN HFA) 108 (90 Base) MCG/ACT inhaler Inhale 2 puffs into the lungs every 6 (six) hours as needed for wheezing or shortness of breath. 6.7 g 10   amiodarone (PACERONE) 200 MG tablet TAKE ONE TABLET BY MOUTH  DAILY 90 tablet 3   Blood Glucose Monitoring Suppl (ACCU-CHEK GUIDE ME) w/Device KIT Use accu chek meter to check blood sugar three times daily. 1 kit 0   cetirizine (ZYRTEC) 10 MG tablet Take 10 mg by mouth daily.     Cholecalciferol (DIALYVITE VITAMIN D 5000) 125 MCG (5000 UT) capsule Take 5,000 Units by mouth  daily.     Continuous Blood Gluc Receiver (FREESTYLE LIBRE 2 READER) DEVI Use to check blood sugars daily 1 each 0   Continuous Blood Gluc Sensor (FREESTYLE LIBRE 2 SENSOR) MISC Use to check blood sugars 4 times a day. 6 each 2   ELIQUIS 2.5 MG TABS tablet Take 1 tablet (2.5 mg total) by mouth 2 (two) times daily. 180 tablet 1   exemestane (AROMASIN) 25 MG tablet TAKE ONE TABLET BY MOUTH DAILY AFTER BREAKFAST 90 tablet 3   ezetimibe (ZETIA) 10 MG tablet Take 1 tablet (10 mg total) by mouth daily. 90 tablet 3   famotidine (PEPCID) 20 MG tablet TAKE ONE TABLET BY MOUTH TWICE DAILY. ONE AFTER SUPPER 60 tablet 11   furosemide (LASIX) 20 MG tablet Take 20 mg by mouth daily as needed for fluid.     HUMALOG KWIKPEN 100 UNIT/ML KwikPen INJECT SUBCUTANEOUSLY 4 TO  6 UNITS 3 TIMES DAILY  BEFORE MEALS (Patient taking differently: Inject 6-8 Units into the skin 3 (three) times daily.) 30 mL 3   insulin glargine (LANTUS) 100 UNIT/ML injection Inject 20 Units into the skin daily.      Insulin Pen Needle (BD PEN NEEDLE NANO U/F) 32G X 4 MM MISC USE AS INSTRUCTED TO INJECT INSULIN 4 TIMES DAILY. 360 each 1   LANTUS SOLOSTAR 100 UNIT/ML Solostar Pen INJECT SUBCUTANEOUSLY 16  UNITS DAILY IN THE MORNING 15 mL 3   metoprolol succinate (TOPROL-XL) 25 MG 24 hr tablet Take 0.5 tablets (12.5 mg total) by mouth daily. 45 tablet 3   nitroGLYCERIN (NITROSTAT) 0.4 MG SL tablet Place 1 tablet (0.4 mg total) under the tongue every 5 (five) minutes as needed for chest pain. 25 tablet 3   ondansetron (ZOFRAN ODT) 4 MG disintegrating tablet Take 1 tablet (4 mg total) by mouth every 8 (eight) hours as needed. 10 tablet 0   rosuvastatin (CRESTOR) 40 MG tablet Take 1 tablet (40 mg total) by mouth at bedtime. 90 tablet 3   SYMBICORT 160-4.5 MCG/ACT inhaler INHALE TWO PUFFS INTO THE LUNGS TWICE DAILY 85.4 g 5   TRULICITY 1.5 OE/7.0JJ SOPN INJECT THE CONTENTS OF ONE  PEN SUBCUTANEOUSLY WEEKLY  AS DIRECTED 6 mL 3   vitamin B-12  (CYANOCOBALAMIN) 100 MCG tablet Take 100 mcg by mouth daily.     zolpidem (AMBIEN) 5 MG tablet Take 7.5 mg by mouth at bedtime as needed for sleep.     No current facility-administered medications for this visit.   Facility-Administered Medications Ordered in Other Visits  Medication Dose Route Frequency Provider Last Rate Last Admin   Darbepoetin Alfa (ARANESP) injection 500 mcg  500 mcg Subcutaneous Once Causey, Charlestine Massed, NP        PHYSICAL EXAMINATION: ECOG PERFORMANCE STATUS: 3 - Symptomatic, >50% confined to bed  Vitals:   01/20/21 1134  BP: 103/78  Pulse: 80  Resp: 18  Temp: (!) 97.5 F (36.4 C)  SpO2: 100%   Filed Weights   01/20/21 1134  Weight: 139 lb 9.6 oz (63.3 kg)      LABORATORY DATA:  I have reviewed the data as listed CMP Latest  Ref Rng & Units 11/18/2020 11/11/2020 10/21/2020  Glucose 70 - 99 mg/dL 110(H) 218(H) 151(H)  BUN 8 - 23 mg/dL 45(H) 40(H) 39(H)  Creatinine 0.44 - 1.00 mg/dL 2.43(H) 2.30(H) 2.42(H)  Sodium 135 - 145 mmol/L 140 140 139  Potassium 3.5 - 5.1 mmol/L 4.9 4.5 4.5  Chloride 98 - 111 mmol/L 107 108 106  CO2 22 - 32 mmol/L 27 28 26   Calcium 8.9 - 10.3 mg/dL 9.4 9.3 9.6  Total Protein 6.5 - 8.1 g/dL 6.4(L) 6.1(L) -  Total Bilirubin 0.3 - 1.2 mg/dL 0.5 0.7 -  Alkaline Phos 38 - 126 U/L 70 61 -  AST 15 - 41 U/L 41 51(H) -  ALT 0 - 44 U/L 36 41 -    Lab Results  Component Value Date   WBC 7.2 01/19/2021   HGB 8.4 (L) 01/19/2021   HCT 27.1 (L) 01/19/2021   MCV 101.5 (H) 01/19/2021   PLT 138 (L) 01/19/2021   NEUTROABS 5.1 01/19/2021    ASSESSMENT & PLAN:  Anemia of chronic disease Patient has had longstanding anemia where the hemoglobin was between 10 to 12 g. Since April 2020 her hemoglobin has gotten worse During the same timeframe her creatinine got from 1.5-2.44    Current treatment: Retacrit injections weekly started 01/09/2019; changed to Aranesp 561m q3 weeks 11/12/2019 Bone marrow biopsy 04/08/2019: Normocellular bone  marrow with trilineage hematopoiesis, absent iron stores, flow cytometry no abnormal B or T-cell clonal abnormalities noted.     IV iron therapy:  Venofer: November 2020, July 2021, June 2022  Lab review: 01/22/2020: Hemoglobin 10.3 (previously transfuse for hemoglobin of 7.5) 02/04/2020: Hemoglobin 11.4 (did not receive Aranesp injection) 03/14/2020: Hemoglobin 10.5, platelets 108 (did not receive Aranesp injection) 03/25/20: Hemoglobin 10.6 (did not receive Aranesp) 05/13/2020: Hemoglobin 8.9: Receiving Aranesp today 06/17/2020: Hemoglobin 9.9: Receiving Aranesp 07/22/2020: Hemoglobin 10.4: Received Aranesp 08/19/20: Hb 9.5 : received Aranesp 01/20/2021: Hemoglobin 8.4 (restarted Aranesp)   It appears that the IV iron has not made any major difference in her hemoglobin.  She feels extremely tired.  I recommended restarting her on Aranesp treatment today.  Recheck labs in 2 weeks. Follow-up in 1 month with labs and injection appointment      Orders Placed This Encounter  Procedures   Ferritin    Standing Status:   Future    Standing Expiration Date:   01/20/2022   Sample to Blood Bank    Standing Status:   Future    Standing Expiration Date:   01/20/2022   The patient has a good understanding of the overall plan. she agrees with it. she will call with any problems that may develop before the next visit here.  Total time spent: 20 mins including face to face time and time spent for planning, charting and coordination of care  VRulon Eisenmenger MD, MPH 01/20/2021  I, KThana Ates am acting as scribe for Dr. VNicholas Lose  I have reviewed the above documentation for accuracy and completeness, and I agree with the above.

## 2021-01-20 ENCOUNTER — Other Ambulatory Visit: Payer: Medicare Other

## 2021-01-20 ENCOUNTER — Inpatient Hospital Stay: Payer: Medicare Other

## 2021-01-20 ENCOUNTER — Inpatient Hospital Stay (HOSPITAL_BASED_OUTPATIENT_CLINIC_OR_DEPARTMENT_OTHER): Payer: Medicare Other | Admitting: Hematology and Oncology

## 2021-01-20 VITALS — BP 103/78 | HR 80 | Temp 97.5°F | Resp 18 | Ht 63.5 in | Wt 139.6 lb

## 2021-01-20 DIAGNOSIS — D638 Anemia in other chronic diseases classified elsewhere: Secondary | ICD-10-CM

## 2021-01-20 DIAGNOSIS — D509 Iron deficiency anemia, unspecified: Secondary | ICD-10-CM | POA: Diagnosis not present

## 2021-01-20 DIAGNOSIS — I129 Hypertensive chronic kidney disease with stage 1 through stage 4 chronic kidney disease, or unspecified chronic kidney disease: Secondary | ICD-10-CM | POA: Diagnosis not present

## 2021-01-20 MED ORDER — DARBEPOETIN ALFA 500 MCG/ML IJ SOSY
500.0000 ug | PREFILLED_SYRINGE | Freq: Once | INTRAMUSCULAR | Status: AC
  Administered 2021-01-20: 500 ug via SUBCUTANEOUS
  Filled 2021-01-20: qty 1

## 2021-01-20 NOTE — Patient Instructions (Signed)
Darbepoetin Alfa injection What is this medication? DARBEPOETIN ALFA (dar be POE e tin AL fa) helps your body make more red blood cells. It is used to treat anemia caused by chronic kidney failure and chemotherapy. This medicine may be used for other purposes; ask your health care provider or pharmacist if you have questions. COMMON BRAND NAME(S): Aranesp What should I tell my care team before I take this medication? They need to know if you have any of these conditions: blood clotting disorders or history of blood clots cancer patient not on chemotherapy cystic fibrosis heart disease, such as angina, heart failure, or a history of a heart attack hemoglobin level of 12 g/dL or greater high blood pressure low levels of folate, iron, or vitamin B12 seizures an unusual or allergic reaction to darbepoetin, erythropoietin, albumin, hamster proteins, latex, other medicines, foods, dyes, or preservatives pregnant or trying to get pregnant breast-feeding How should I use this medication? This medicine is for injection into a vein or under the skin. It is usually given by a health care professional in a hospital or clinic setting. If you get this medicine at home, you will be taught how to prepare and give this medicine. Use exactly as directed. Take your medicine at regular intervals. Do not take your medicine more often than directed. It is important that you put your used needles and syringes in a special sharps container. Do not put them in a trash can. If you do not have a sharps container, call your pharmacist or healthcare provider to get one. A special MedGuide will be given to you by the pharmacist with each prescription and refill. Be sure to read this information carefully each time. Talk to your pediatrician regarding the use of this medicine in children. While this medicine may be used in children as young as 1 month of age for selected conditions, precautions do apply. Overdosage: If you  think you have taken too much of this medicine contact a poison control center or emergency room at once. NOTE: This medicine is only for you. Do not share this medicine with others. What if I miss a dose? If you miss a dose, take it as soon as you can. If it is almost time for your next dose, take only that dose. Do not take double or extra doses. What may interact with this medication? Do not take this medicine with any of the following medications: epoetin alfa This list may not describe all possible interactions. Give your health care provider a list of all the medicines, herbs, non-prescription drugs, or dietary supplements you use. Also tell them if you smoke, drink alcohol, or use illegal drugs. Some items may interact with your medicine. What should I watch for while using this medication? Your condition will be monitored carefully while you are receiving this medicine. You may need blood work done while you are taking this medicine. This medicine may cause a decrease in vitamin B6. You should make sure that you get enough vitamin B6 while you are taking this medicine. Discuss the foods you eat and the vitamins you take with your health care professional. What side effects may I notice from receiving this medication? Side effects that you should report to your doctor or health care professional as soon as possible: allergic reactions like skin rash, itching or hives, swelling of the face, lips, or tongue breathing problems changes in vision chest pain confusion, trouble speaking or understanding feeling faint or lightheaded, falls high blood pressure   muscle aches or pains pain, swelling, warmth in the leg rapid weight gain severe headaches sudden numbness or weakness of the face, arm or leg trouble walking, dizziness, loss of balance or coordination seizures (convulsions) swelling of the ankles, feet, hands unusually weak or tired Side effects that usually do not require medical  attention (report to your doctor or health care professional if they continue or are bothersome): diarrhea fever, chills (flu-like symptoms) headaches nausea, vomiting redness, stinging, or swelling at site where injected This list may not describe all possible side effects. Call your doctor for medical advice about side effects. You may report side effects to FDA at 1-800-FDA-1088. Where should I keep my medication? Keep out of the reach of children. Store in a refrigerator between 2 and 8 degrees C (36 and 46 degrees F). Do not freeze. Do not shake. Throw away any unused portion if using a single-dose vial. Throw away any unused medicine after the expiration date. NOTE: This sheet is a summary. It may not cover all possible information. If you have questions about this medicine, talk to your doctor, pharmacist, or health care provider.  2022 Elsevier/Gold Standard (2017-06-12 16:44:20)  

## 2021-01-20 NOTE — Assessment & Plan Note (Addendum)
Patient has had longstanding anemia where the hemoglobin was between 10 to 12 g. Since April 2020 her hemoglobin has gotten worse During the same timeframe her creatinine got from 1.5-2.44   Current treatment:Retacritinjectionsweeklystarted7/31/2020; changed to Aranesp 569m q3 weeks 11/12/2019 Bone marrow biopsy 04/08/2019: Normocellular bone marrow with trilineage hematopoiesis, absent iron stores, flow cytometry no abnormal B or T-cell clonal abnormalities noted.   IV iron therapy:Venofer: November 2020, July 2021, June 2022  Lab review: 01/22/2020: Hemoglobin 10.3 (previously transfuse for hemoglobin of 7.5) 02/04/2020:Hemoglobin 11.4(did not receive Aranesp injection) 03/14/2020: Hemoglobin 10.5, platelets 108 (did not receive Aranesp injection) 03/25/20:Hemoglobin 10.6 (did not receive Aranesp) 05/13/2020: Hemoglobin 8.9: Receiving Aranesp today 06/17/2020: Hemoglobin 9.9: Receiving Aranesp 07/22/2020: Hemoglobin 10.4: Received Aranesp 08/19/20: Hb 9.5 : received Aranesp 01/20/2021: Hemoglobin 8.4 (restarted Aranesp)  It appears that the IV iron has not made any major difference in her hemoglobin.  She feels extremely tired.  I recommended restarting her on Aranesp treatment today.  Recheck labs in 2 weeks. Follow-up in 1 month with labs and injection appointment

## 2021-01-21 IMAGING — CT CT CHEST W/O CM
2 of 4 series · 11 of 36 positions shown, 13 images · non-contrast
Comparison: Chest radiographs dated 04/28/2019. CT chest dated
03/17/2019.

CLINICAL DATA: Pleural effusion, resolving pneumonia

EXAM:
CT CHEST WITHOUT CONTRAST
TECHNIQUE: Multidetector CT imaging of the chest was performed following the
standard protocol without IV contrast.

[Series 2: chest 2.00 br40 s3 · axial · 0.58mm/px · z∈[+1578,+1830]mm · 8 of 150 slices shown, 10 images (1 of 2)]
[im 12/150  mediastinal]
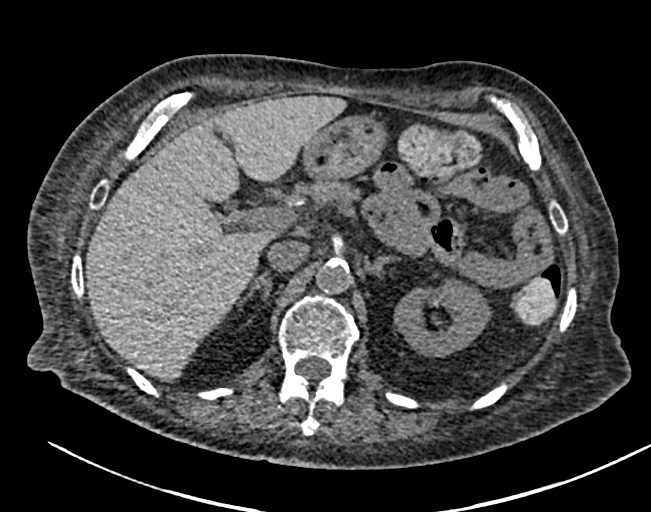
[im 12/150  lung]
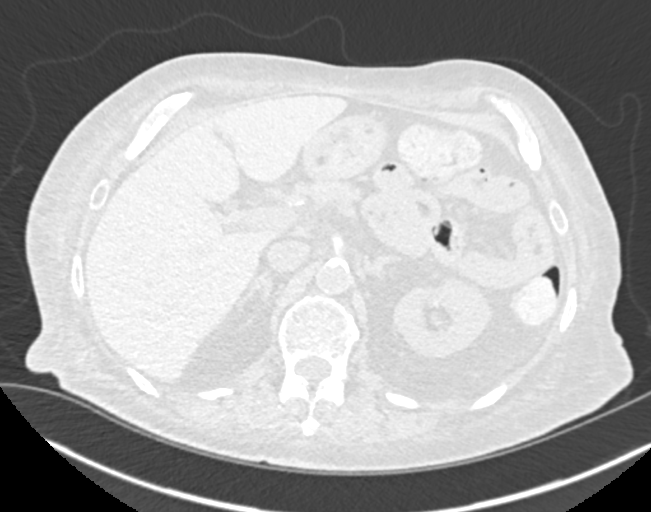
[im 35/150  lung]
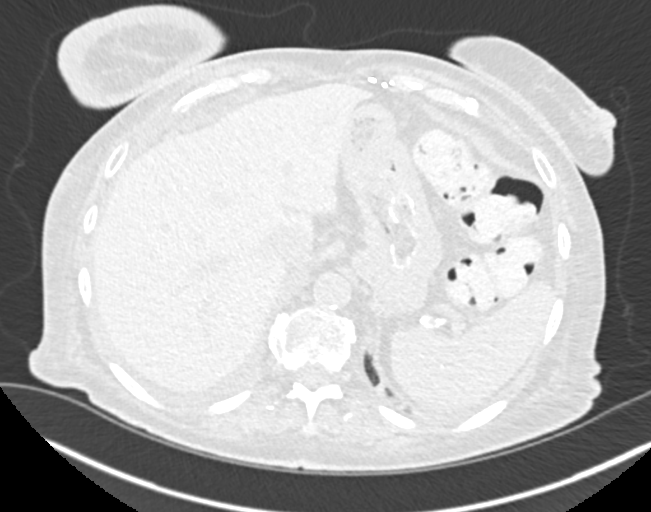
[im 46/150  lung]
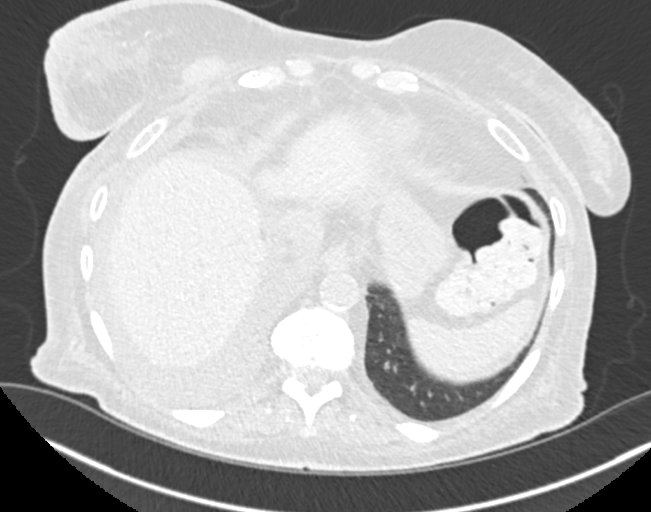
[im 69/150  lung]
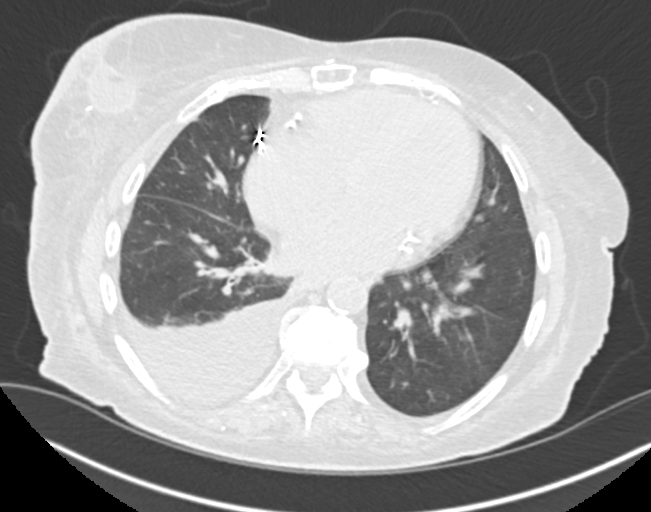
[im 81/150  mediastinal]
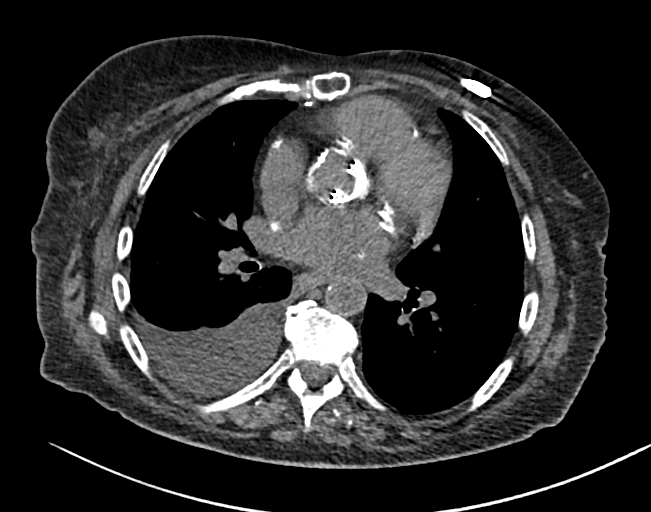
[im 81/150  lung]
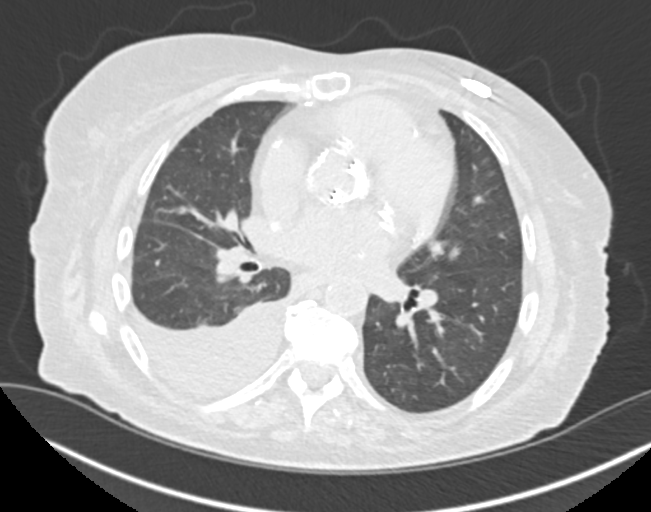
[im 104/150  lung]
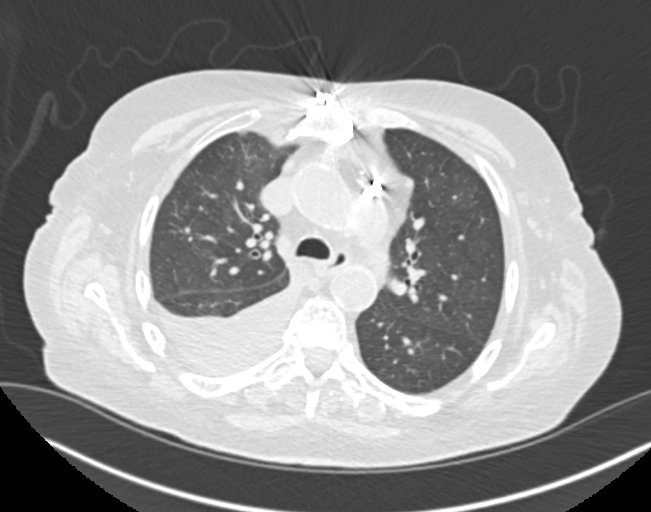
[im 115/150  lung]
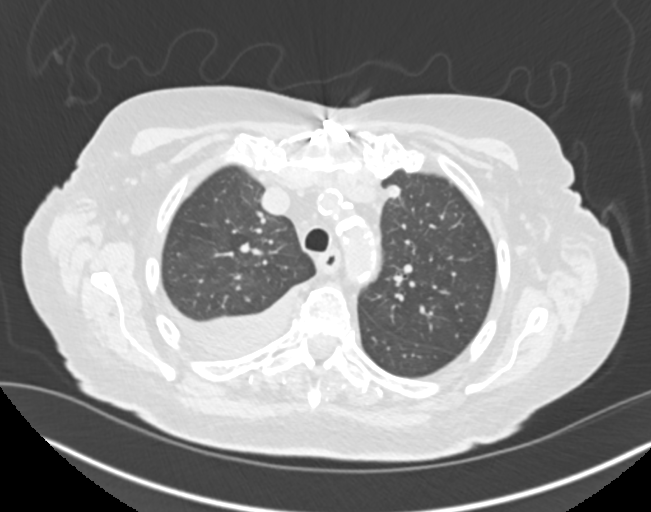
[im 138/150  lung]
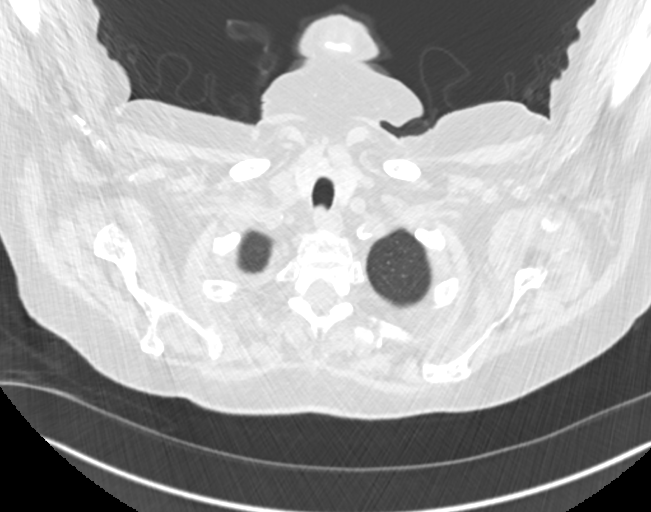

[Series 4: chest 2.00 br40 s3 · coronal · 0.59mm/px · 3 of 148 slices shown (2 of 2)]
[im 30/148  lung]
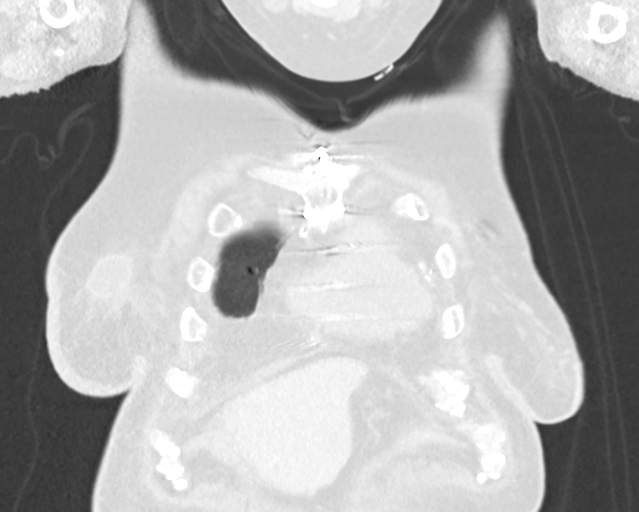
[im 59/148  lung]
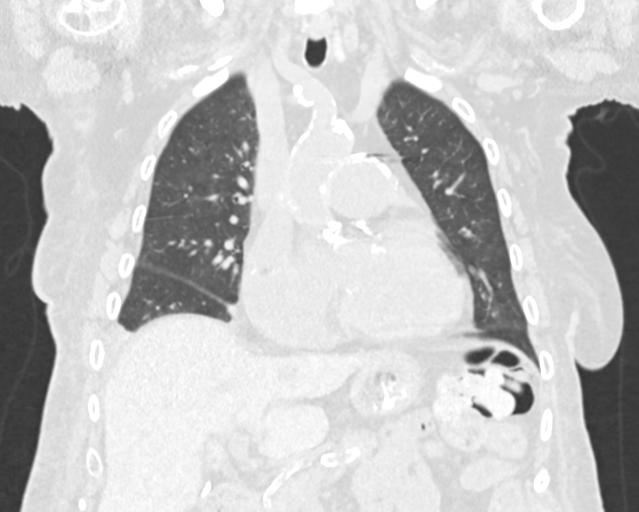
[im 89/148  lung]
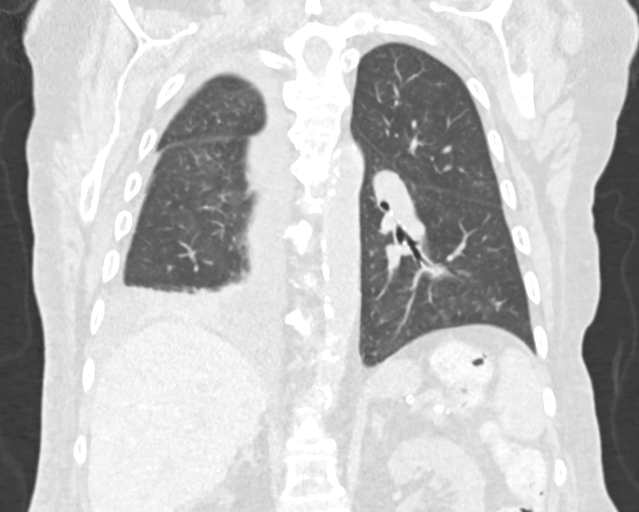

[11 of 36 positions shown; findings below may reference images not displayed]

FINDINGS: Cardiovascular: Mild cardiomegaly. No pericardial effusion.

Prosthetic aortic valve. Atherosclerotic calcifications of the
aortic root/arch. No evidence of thoracic aortic aneurysm.

Three vessel coronary atherosclerosis.

Enlargement of the main pulmonary artery, suggesting pulmonary
arterial hypertension.

Mediastinum/Nodes: No suspicious mediastinal lymphadenopathy.

Visualized thyroid is unremarkable.

Lungs/Pleura: Mild mosaic attenuation in the lungs bilaterally,
suggesting air trapping.

No frank interstitial edema.

Moderate right pleural effusion, increased from prior CT. Associated
right lower lobe atelectasis.

No focal consolidation.

No suspicious pulmonary nodules.

No pneumothorax.

Upper Abdomen: Visualized upper abdomen is notable for a partially
visualized exophytic right upper pole renal cyst measuring at least
3.6 cm (series 2/image 148), prior cholecystectomy, and vascular
calcifications.

Musculoskeletal: Two areas of suspected postprocedural
changes/scarring in the lateral right breast/chest wall (series
2/image 58). Additional 4.2 x 2.2 cm subcutaneous lesion in the
lateral right breast (series 2/image 92), similar to the most recent
prior, although progressive from CT abdomen/pelvis dated 06/10/2018
(series 2/image 12 on the prior study). These correspond to sites of
fat necrosis on biopsy.

Central right breast seroma measuring 2.5 x 2.8 cm (series 2/image
83). Overlying skin thickening.

1.2 x 2.4 cm lesion along the inferior aspect of the right
breast/chest wall (series 2/image 106). When correlating with prior
CT abdomen/pelvis dated 06/10/2018, this measured 9 mm in
retrospect.
IMPRESSION: Moderate right pleural effusion, increased from prior CT. Associated
right lower lobe atelectasis.

No evidence of pneumonia.

1.2 x 2.4 cm lesion along the inferior aspect of the right
breast/chest wall. While additional findings noted above correlate
with areas of fat necrosis, this lesion was not clearly identified
when correlating with prior breast tomography report. Breast imaging
consultation is suggested for further evaluation, including
potential ultrasound with biopsy versus breast MR. These results
will be called to the ordering clinician or representative by the
Radiologist Assistant, and communication documented in the PACS or
[employer] Dashboard.

Aortic Atherosclerosis ([id number].P).

## 2021-01-25 ENCOUNTER — Other Ambulatory Visit (INDEPENDENT_AMBULATORY_CARE_PROVIDER_SITE_OTHER): Payer: Medicare Other

## 2021-01-25 ENCOUNTER — Telehealth: Payer: Self-pay

## 2021-01-25 ENCOUNTER — Other Ambulatory Visit: Payer: Self-pay

## 2021-01-25 DIAGNOSIS — Z794 Long term (current) use of insulin: Secondary | ICD-10-CM | POA: Diagnosis not present

## 2021-01-25 DIAGNOSIS — E1165 Type 2 diabetes mellitus with hyperglycemia: Secondary | ICD-10-CM

## 2021-01-25 LAB — HEMOGLOBIN A1C: Hgb A1c MFr Bld: 6.2 % (ref 4.6–6.5)

## 2021-01-25 LAB — BASIC METABOLIC PANEL
BUN: 47 mg/dL — ABNORMAL HIGH (ref 6–23)
CO2: 25 mEq/L (ref 19–32)
Calcium: 9.6 mg/dL (ref 8.4–10.5)
Chloride: 106 mEq/L (ref 96–112)
Creatinine, Ser: 3.09 mg/dL — ABNORMAL HIGH (ref 0.40–1.20)
GFR: 14.25 mL/min — CL (ref >60.00)
Glucose, Bld: 153 mg/dL — ABNORMAL HIGH (ref 70–99)
Potassium: 5 mEq/L (ref 3.5–5.1)
Sodium: 139 mEq/L (ref 135–145)

## 2021-01-25 LAB — LDL CHOLESTEROL, DIRECT: Direct LDL: 45 mg/dL

## 2021-01-25 NOTE — Telephone Encounter (Signed)
Main lab call with a critical lab 14.25 GFR.

## 2021-01-25 NOTE — Telephone Encounter (Signed)
Noted, will review on next visit, only slightly worse than usual

## 2021-01-27 ENCOUNTER — Other Ambulatory Visit: Payer: Self-pay

## 2021-01-27 ENCOUNTER — Ambulatory Visit (INDEPENDENT_AMBULATORY_CARE_PROVIDER_SITE_OTHER): Payer: Medicare Other | Admitting: Endocrinology

## 2021-01-27 ENCOUNTER — Encounter: Payer: Self-pay | Admitting: Endocrinology

## 2021-01-27 VITALS — BP 122/50 | HR 71 | Ht 64.5 in | Wt 142.4 lb

## 2021-01-27 DIAGNOSIS — E1165 Type 2 diabetes mellitus with hyperglycemia: Secondary | ICD-10-CM | POA: Diagnosis not present

## 2021-01-27 DIAGNOSIS — Z794 Long term (current) use of insulin: Secondary | ICD-10-CM

## 2021-01-27 NOTE — Patient Instructions (Addendum)
14 Humalog BEFORE STARTING to eat   Take upto 12 at supper

## 2021-01-27 NOTE — Progress Notes (Signed)
Patient ID: Hannah [name], female   DOB: 11-15-1945, 75 y.o.   MRN: [id number]           Reason for Appointment: Follow-up for Type 2 Diabetes   History of Present Illness:          Date of diagnosis of type 2 diabetes mellitus: Late 1980s        Background history:  She was previously treated with metformin prior to starting insulin She thinks she has been on insulin for at least 10 years and mostly taking Lantus and other types of insulin with it Her metformin was stopped when she started insulin and also has had renal dysfunction Previously her A1c had been consistently over 8% and occasionally higher.  She had been on the V-go pump since 03/2015, this was stopped in 3/20  Recent history:   INSULIN regimen is described as:  Lantus 24 units in am, Humalog 8-10 units before meals  Non-insulin hypoglycemic drugs the patient is taking are: Trulicity 1.5 mg weekly   Current diabetes management, blood sugar patterns and problems identified:  Her A1c is usually lower than expected, now 6.2 compared to 7.3; she also has significant anemia and renal problems  She was finally able to start the freestyle libre and this was downloaded and interpreted as below She was told to go up at least 2 units of her Lantus and at least 2 to 4 units on her mealtime insulin for better control  At her meals she is only taking about 10 units of insulin and only occasionally 14 units when the blood sugars are very high She is still not understanding the need to bolus ahead of time before starting to eat healthy documentation on her freestyle libre appears to be taking her Humalog only when the blood sugar has gone up especially in the morning With this her average blood sugar after her first meal is 185 She will also take some correction boluses Fasting blood sugars are excellent with increasing her LANTUS She has taken her Trulicity regularly Her weight is about the same Overall however her blood  sugars are better, previously averaging about 180 on her meter  Dinner 6-7 PM usually  Side effects from medications have been: None  Compliance with the medical regimen: Fair  Last 2 weeks Freestyle libre version 2 was interpreted as follows  Blood sugar data is fairly complete except occasionally late in the evening Accuracy of the data appears to be fairly good with lab glucose only slightly higher than the estimated glucose at the same time HYPERGLYCEMIA appears to be occurring inconsistently but mostly after her first meal around midday and variably in the late afternoon or evening Overnight blood sugars improved by about 3 AM and may be as low as 109 average early morning Hypoglycemia has not been present with only rare low blood sugar mid afternoon POSTPRANDIAL readings are mostly higher after her late morning meal and occasionally in the afternoon or evening; clinically blood sugars may be persistently high for some time  Freestyle libre data  CGM use % of time 89  2-week average/GV 155/32  Time in range       72%  % Time Above 180 23+5  % Time above 250   % Time Below 70 0       Previously:  PRE-MEAL Fasting Lunch Dinner Bedtime Overall  Glucose range:     56-482  Mean/median:  150  173  218  165 180  POST-MEAL PC Breakfast PC Lunch PC Dinner  Glucose range:     Mean/median:    192       Self-care: The diet that the patient has been following is: tries to limit fats and carbs.  She uses diet green tea or diet drinks and no sugar in drinks    Lunch 1-2 pm Dinner 6-7 PM  Typical meal intake: Breakfast is oatrmeal or cheese toast   if she has any            Dietician visit, most recent: 5/16 And in 7/16 with oncology dietitian    Weight history:  Wt Readings from Last 3 Encounters:  01/27/21 142 lb 6.4 oz (64.6 kg)  01/20/21 139 lb 9.6 oz (63.3 kg)  12/09/20 143 lb (64.9 kg)    Glycemic control:      Lab Results  Component Value Date   HGBA1C  6.2 01/25/2021   HGBA1C 7.3 (H) 10/21/2020   HGBA1C 7.1 (H) 04/04/2020   Lab Results  Component Value Date   MICROALBUR 16.8 (H) 10/21/2020   LDLCALC 58 02/23/2020   CREATININE 3.09 (H) 01/25/2021   Lab Results  Component Value Date   FRUCTOSAMINE 241 12/11/2019   FRUCTOSAMINE 276 07/02/2019   FRUCTOSAMINE 276 10/11/2017    Lab Results  Component Value Date   HGB 8.4 (L) 01/19/2021       Allergies as of 01/27/2021       Reactions   Amoxicillin Rash, Other (See Comments)   Tolerates Cephalosporins Has patient had a PCN reaction causing immediate rash, facial/tongue/throat swelling, SOB or lightheadedness with hypotension: Yes Has patient had a PCN reaction causing severe rash involving mucus membranes or skin necrosis: Yes Has patient had a PCN reaction that required hospitalization No Has patient had a PCN reaction occurring within the last 10 years: No If all of the above answers are "NO", then may proceed with Cephalosporin use.   Tape Other (See Comments)   Pulls off skin, must use paper tape   Aldactone [spironolactone] Other (See Comments)   CKD/hypokalemia   Isosorbide Nitrate    Other reaction(s): fainting   K-flex [orphenadrine]    Other reaction(s): stomach pain   Arimidex [anastrozole] Nausea Only   Latex Itching, Other (See Comments)   (Dentist office)   Tetracycline Rash        Medication List        Accurate as of January 27, 2021  2:59 PM. If you have any questions, ask your nurse or doctor.          Accu-Chek FastClix Lancets Misc Use to check blood sugar 3 times per day   Accu-Chek Guide Me w/Device Kit Use accu chek meter to check blood sugar three times daily.   Accu-Chek Guide test strip Generic drug: glucose blood Use to test blood sugar 3 times daily   acetaminophen 500 MG tablet Commonly known as: TYLENOL Take 1,000 mg by mouth as needed for moderate pain.   albuterol (2.5 MG/3ML) 0.083% nebulizer solution Commonly known  as: PROVENTIL Take 3 mLs (2.5 mg total) by nebulization every 6 (six) hours as needed for wheezing or shortness of breath.   albuterol 108 (90 Base) MCG/ACT inhaler Commonly known as: VENTOLIN HFA Inhale 2 puffs into the lungs every 6 (six) hours as needed for wheezing or shortness of breath.   amiodarone 200 MG tablet Commonly known as: PACERONE TAKE ONE TABLET BY MOUTH DAILY   BD Pen Needle Nano U/F 32G X  4 MM Misc Generic drug: Insulin Pen Needle USE AS INSTRUCTED TO INJECT INSULIN 4 TIMES DAILY.   cetirizine 10 MG tablet Commonly known as: ZYRTEC Take 10 mg by mouth daily.   Dialyvite Vitamin D 5000 125 MCG (5000 UT) capsule Generic drug: Cholecalciferol Take 5,000 Units by mouth daily.   Eliquis 2.5 MG Tabs tablet Generic drug: apixaban Take 1 tablet (2.5 mg total) by mouth 2 (two) times daily.   exemestane 25 MG tablet Commonly known as: AROMASIN TAKE ONE TABLET BY MOUTH DAILY AFTER BREAKFAST   ezetimibe 10 MG tablet Commonly known as: ZETIA Take 1 tablet (10 mg total) by mouth daily.   famotidine 20 MG tablet Commonly known as: PEPCID TAKE ONE TABLET BY MOUTH TWICE DAILY. ONE AFTER SUPPER   FreeStyle Libre 2 Reader Kerrin Mo Use to check blood sugars daily   FreeStyle Libre 2 Sensor Misc Use to check blood sugars 4 times a day.   furosemide 20 MG tablet Commonly known as: LASIX Take 20 mg by mouth daily as needed for fluid.   HumaLOG KwikPen 100 UNIT/ML KwikPen Generic drug: insulin lispro INJECT SUBCUTANEOUSLY 4 TO  6 UNITS 3 TIMES DAILY  BEFORE MEALS What changed: See the new instructions.   insulin glargine 100 UNIT/ML injection Commonly known as: LANTUS Inject 20 Units into the skin daily.   Lantus SoloStar 100 UNIT/ML Solostar Pen Generic drug: insulin glargine INJECT SUBCUTANEOUSLY 16  UNITS DAILY IN THE MORNING   metoprolol succinate 25 MG 24 hr tablet Commonly known as: TOPROL-XL Take 0.5 tablets (12.5 mg total) by mouth daily.    nitroGLYCERIN 0.4 MG SL tablet Commonly known as: NITROSTAT Place 1 tablet (0.4 mg total) under the tongue every 5 (five) minutes as needed for chest pain.   ondansetron 4 MG disintegrating tablet Commonly known as: Zofran ODT Take 1 tablet (4 mg total) by mouth every 8 (eight) hours as needed.   rosuvastatin 40 MG tablet Commonly known as: CRESTOR Take 1 tablet (40 mg total) by mouth at bedtime.   Symbicort 160-4.5 MCG/ACT inhaler Generic drug: budesonide-formoterol INHALE TWO PUFFS INTO THE LUNGS TWICE DAILY   Trulicity 1.5 KZ/9.9JT Sopn Generic drug: Dulaglutide INJECT THE CONTENTS OF ONE  PEN SUBCUTANEOUSLY WEEKLY  AS DIRECTED   vitamin B-12 100 MCG tablet Commonly known as: CYANOCOBALAMIN Take 100 mcg by mouth daily.   zolpidem 5 MG tablet Commonly known as: AMBIEN Take 7.5 mg by mouth at bedtime as needed for sleep.        Allergies:  Allergies  Allergen Reactions   Amoxicillin Rash and Other (See Comments)    Tolerates Cephalosporins Has patient had a PCN reaction causing immediate rash, facial/tongue/throat swelling, SOB or lightheadedness with hypotension: Yes Has patient had a PCN reaction causing severe rash involving mucus membranes or skin necrosis: Yes Has patient had a PCN reaction that required hospitalization No Has patient had a PCN reaction occurring within the last 10 years: No If all of the above answers are "NO", then may proceed with Cephalosporin use.    Tape Other (See Comments)    Pulls off skin, must use paper tape   Aldactone [Spironolactone] Other (See Comments)    CKD/hypokalemia   Isosorbide Nitrate     Other reaction(s): fainting   K-Flex [Orphenadrine]     Other reaction(s): stomach pain   Arimidex [Anastrozole] Nausea Only   Latex Itching and Other (See Comments)    (Dentist office)   Tetracycline Rash    Past Medical History:  Diagnosis Date   Abnormally small mouth    Allergic rhinitis 10/14/2009   Qualifier: Diagnosis  of  By: Lamonte Sakai MD, Rose Fillers   Overview:  Overview:  Qualifier: Diagnosis of  By: Lamonte Sakai MD, Rose Fillers  Last Assessment & Plan:  Please continue Xyzal and Nasacort as you have been using them   Anemia    Asthma 05/12/2009   10/12/2014 p extensive coaching HFA effectiveness =    75% s spacer    Overview:  Overview:  10/12/2014 p extensive coaching HFA effectiveness =    75% s spacer   Last Assessment & Plan:  Please continue Symbicort 2 puffs twice a day. Remember to rinse and gargle after taking this medication. Take albuterol 2 puffs up to every 4 hours if needed for shortness of breath.  Follow with Dr Lamonte Sakai in 6 month   Bilateral carotid artery stenosis    a. mild 1-39% by duplex 10/2017, due 2021.   Breast cancer ([place]) 01/18/2016   right breast   CAD (coronary artery disease) 01/24/2011   a. s/p CABG 1998 with redo 2009.   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   Chemotherapy-induced thrombocytopenia 04/06/2016   Chronic diastolic CHF (congestive heart failure) ([place]) 09/24/2013   CKD (chronic kidney disease), stage IV ([place]) 09/24/2013   Closed fracture of head of left humerus 06/16/9448   Complication of anesthesia    Dysrhythmia    a-fib   Gallstones    GERD (gastroesophageal reflux disease)    Glaucoma    H/O atrial tachycardia 05/12/2009   Qualifier: History of  By: Lamonte Sakai MD, Rose Fillers   Overview:  Overview:  Qualifier: History of  By: Lamonte Sakai MD, Rose Fillers  Last Assessment & Plan:  There was no evidence of this on her cardiac monitor. I would recommend watchful waiting.    Heart murmur    History of kidney stones    History of non-ST elevation myocardial infarction (NSTEMI)    Sept 2014--  thought to be type II HTN w/ LHC without infarct related artery and patent grafts   History of radiation therapy 05/24/16-07/26/16   right breast 50.4 Gy in 28 fractions, right breast boost 10 Gy in 5 fractions   Hyperlipidemia     Hypotension    Iron deficiency anemia    Mania ([place]) 03/05/2016   Moderate persistent asthma    pulmologist-  Dr. Malvin Johns   Osteomyelitis of toe of left foot ([place]) 04/06/2016   Osteopenia of multiple sites 10/19/2015   PAF (paroxysmal atrial fibrillation) ([place])    Personal history of chemotherapy 2017   Personal history of radiation therapy 2017   PONV (postoperative nausea and vomiting)    Port catheter in place 02/17/2016   Psoriasis    right leg   Renal calculus, right    S/P AVR    prosthesis valve placement 2009 at same time re-do CABG   Seizure-like activity ([place]) 09/01/2017   Sensorineural hearing loss (SNHL) of both ears 01/06/2016   Stroke ([place]) 2014   residual rt hearing loss   Syncope 09/06/2017   Type 2 diabetes mellitus ([place])    monitored by dr Dwyane Dee    Past Surgical History:  Procedure Laterality Date   [facility]  [facility]   [facility] is [facility]   BREAST BIOPSY Right 02/12/2019   2 lymphnodes, 1 breast   BREAST LUMPECTOMY Right 01/18/2016  BREAST LUMPECTOMY WITH RADIOACTIVE SEED AND SENTINEL LYMPH NODE BIOPSY Right 01/18/2016   Procedure: RIGHT BREAST LUMPECTOMY WITH RADIOACTIVE SEED AND SENTINEL LYMPH NODE BIOPSY;  Surgeon: Alphonsa Overall, MD;  Location: [facility];  Service: General;  Laterality: Right;   CARDIAC CATHETERIZATION  03/23/2008   Pre-redo CABG: L main OK, LAD (T), CFX (T), OM1 99%, RCA (T), LIMA-LAD OK, SVG-OM(?3) OK w/ little florw to OM2, SVG-RCA OK. EF NL   CARPAL TUNNEL RELEASE     CHEST TUBE INSERTION Right 06/26/2019   Procedure: INSERTION PLEURAL DRAINAGE CATHETER;  Surgeon: Gaye Pollack, MD;  Location: [place] [facility];  Service: Thoracic;  Laterality: Right;   CHOLECYSTECTOMY N/A 07/07/2018   Procedure: LAPAROSCOPIC CHOLECYSTECTOMY WITH INTRAOPERATIVE CHOLANGIOGRAM ERAS PATHWAY;  Surgeon: Alphonsa Overall, MD;  Location: [facility];  Service: General;  Laterality: N/A;   COLONOSCOPY     around 2015. Possibly with  [place] &  re-do 2009   Had LIMA to DX/LAD, SVG to 2 marginal branches and SVG to [facility] originally; SVG to 3rd OM and PD at time of redo   CYSTOSCOPY W/ URETERAL STENT PLACEMENT Right 12/20/2014   Procedure: CYSTOSCOPY WITH RETROGRADE PYELOGRAM/URETERAL STENT PLACEMENT;  Surgeon: Cleon Gustin, MD;  [facility];  Service: Urology;  Laterality: Right;   CYSTOSCOPY WITH RETROGRADE PYELOGRAM, URETEROSCOPY AND STENT PLACEMENT Left 08/21/2019   Procedure: CYSTOSCOPY WITH RETROGRADE PYELOGRAM, URETEROSCOPY AND STENT PLACEMENT;  Surgeon: Cleon Gustin, MD;  Location: [facility];  Service: Urology;  Laterality: Left;  1 HR   ESOPHAGOGASTRODUODENOSCOPY     many years ago per patient    ESOPHAGOGASTRODUODENOSCOPY ENDOSCOPY  06/17/2018   EYE SURGERY Bilateral    cataracts   HOLMIUM LASER APPLICATION Right 9/56/3875   Procedure:  HOLMIUM LASER LITHOTRIPSY;  Surgeon: Cleon Gustin, MD;  [facility];  Service: Urology;  Laterality: Right;   HOLMIUM LASER APPLICATION Left 6/43/3295   Procedure: HOLMIUM LASER APPLICATION;  Surgeon: Cleon Gustin, MD;  Location: [facility];  Service: Urology;  Laterality: Left;   IR KYPHO LUMBAR INC FX REDUCE BONE BX UNI/BIL CANNULATION INC/IMAGING  02/12/2020   IR THORACENTESIS ASP PLEURAL SPACE W/IMG GUIDE  10/03/2018   IR THORACENTESIS ASP PLEURAL SPACE W/IMG GUIDE  06/08/2019   LEFT HEART CATHETERIZATION WITH CORONARY/GRAFT ANGIOGRAM N/A 02/23/2013   Procedure: LEFT HEART CATHETERIZATION WITH Beatrix Fetters;  Surgeon: Blane Ohara, MD;  Location: [facility] CATH LAB;  Service: Cardiovascular;  Laterality: N/A;   LOOP RECORDER INSERTION N/A 08/30/2017   Procedure: LOOP RECORDER INSERTION;  Surgeon: Evans Lance, MD;  Location: [place] [facility];  Service: Cardiovascular;  Laterality: N/A;   LOOP RECORDER REMOVAL N/A 08/10/2020   Procedure: LOOP RECORDER REMOVAL;  Surgeon:  Evans Lance, MD;  Location: [place] [facility];  Service: Cardiovascular;  Laterality: N/A;   PACEMAKER IMPLANT N/A 08/10/2020   Procedure: PACEMAKER IMPLANT;  Surgeon: Evans Lance, MD;  Location: [place] [facility];  Service: Cardiovascular;  Laterality: N/A;   PORTACATH PLACEMENT Left 01/18/2016   Procedure: INSERTION PORT-A-CATH;  Surgeon: Alphonsa Overall, MD;  Location: [place];  Service: General;  Laterality: Left;   portacath removal     REMOVAL OF PLEURAL DRAINAGE CATHETER Right 07/14/2019   Procedure: REMOVAL OF PLEURAL DRAINAGE CATHETER;  Surgeon: Gaye Pollack, MD;  Location: [place];  Service: Thoracic;  Laterality: Right;   TALC PLEURODESIS N/A 06/26/2019   Procedure:  TALC PLEURADESIS;  Surgeon: Gaye Pollack, MD;  Location: [facility];  Service: Thoracic;  Laterality: N/A;   TONSILLECTOMY     TRANSTHORACIC ECHOCARDIOGRAM  02-24-2013      mild LVH,  ef 50-55%/  AV bioprosthesis was present with very mild stenosis and no regurg., mean grandient 79mHg, peak grandient 264mg /  mild MR/  mild LAE and RAE/  moderate TR   TUBAL LIGATION      Family History  Problem Relation Age of Onset   Heart disease Father    Heart failure Father    Diabetes Maternal Grandmother    Heart disease Maternal Grandmother    Diabetes Son    Healthy Brother        #1   Heart attack Brother        #2   Heart disease Brother        #2   Colon cancer Neg Hx    Esophageal cancer Neg Hx     Social History:  reports that she has never smoked. She has never used smokeless tobacco. She reports that she does not drink alcohol and does not use drugs.    Review of Systems    Lipid history: On 40 mg Crestor along with Zetia prescribed by cardiology     Lab Results  Component Value Date   CHOL 106 02/23/2020   HDL 33 (L) 02/23/2020   LDLCALC 58 02/23/2020   LDLDIRECT 45.0 01/25/2021   TRIG 74 02/23/2020   CHOLHDL 3.2 02/23/2020           Eyes:  No history of retinopathy  She is followed by  nephrologist for renal dysfunction, was seen today and no intervention done   Lab Results  Component Value Date   CREATININE 3.09 (H) 01/25/2021   CREATININE 2.43 (H) 11/18/2020   CREATININE 2.30 (H) 11/11/2020    Diabetic foot exam in 3/20: She has significant loss of monofilament sensation in her distal feet She thinks her neuropathy started after her chemotherapy She has difficulty with balance She has significant numbness but no pain or paresthesia  BLOOD pressure: She has generally fairly good blood pressure readings as below  BP Readings from Last 3 Encounters:  01/27/21 (!) 122/50  01/20/21 103/78  12/23/20 128/84   She is on amiodarone 200 mg, thyroid levels have been normal  Lab Results  Component Value Date   TSH 2.976 11/11/2020      Physical Examination:  BP (!) 122/50   Pulse 71   Ht 5' 4.5" (1.638 m)   Wt 142 lb 6.4 oz (64.6 kg)   LMP  (LMP Unknown)   SpO2 99%   BMI 24.07 kg/m     ASSESSMENT:  Diabetes type 2, on insulin    See history of present illness for detailed discussion of  current management, blood sugar patterns and problems identified  Blood sugar prior readings, readings and averages reviewed from her meter and compared to previous data  A1c is 6.2 compared to 7.3 She does also have significant anemia and CKD  She is on basal bolus insulin injections and Trulicity  She does appear to be requiring relatively larger doses of mealtime insulin compared to basal  With 24 units of Lantus her fasting readings are fairly good   She has significantly high postprandial readings frequently over 200  This is mostly after her first meal late morning despite eating some protein usually with her breakfast She can do better with both the  timing and the amount of insulin for her meals  Discussed importance of dosing before she starts making her breakfast to enable insulin action ahead of time  Showed her the graph of postprandial hyperglycemia  and the timing of insulin   She can continue the same dose of Lantus  Likely how much she is benefiting from Trulicity However since her weight is relatively low will not increase her Trulicity as yet  Hypertension: Well-controlled  Renal failure: She has significant decrease with GFR now about 14 and will continue to follow closely with her nephrologist  PLAN:    She will increase her Humalog as much as 4 units for her first meal and if postprandial readings are still over 180 go up to a total of 16 units She may also need to up to 12 units at dinnertime based on her meal size Take the insulin injection 5 to 10 minutes before eating She will call if she has any difficulties with blood sugar monitoring or insulin She will also periodically compare fingersticks to her libre  Continue 1.5 mg Trulicity, may increase the dose if she has weight gain   Patient Instructions  14 Humalog BEFORE STARTING to eat   Take upto 12 at supper   Total visit time including counseling = 30 minutes   Hannah [name] 01/27/2021, 2:59 PM   Note: This office note was prepared with Dragon voice recognition system technology. Any transcriptional errors that result from this process are unintentional.

## 2021-01-30 IMAGING — US US THORACENTESIS ASP PLEURAL SPACE W/IMG GUIDE
1 series · 7 of 7 positions shown · non-contrast
Comparison: none

INDICATION: Patient with prior history of breast cancer, right breast/chest wall
lesion on recent CT, CHF, chronic kidney disease, recurrent right
pleural effusion. Request made for diagnostic and therapeutic right
thoracentesis.

[Series 1: us thoracentesis asp pleural space w/img guide · 7 of 7 slices shown]
[im 1/7]
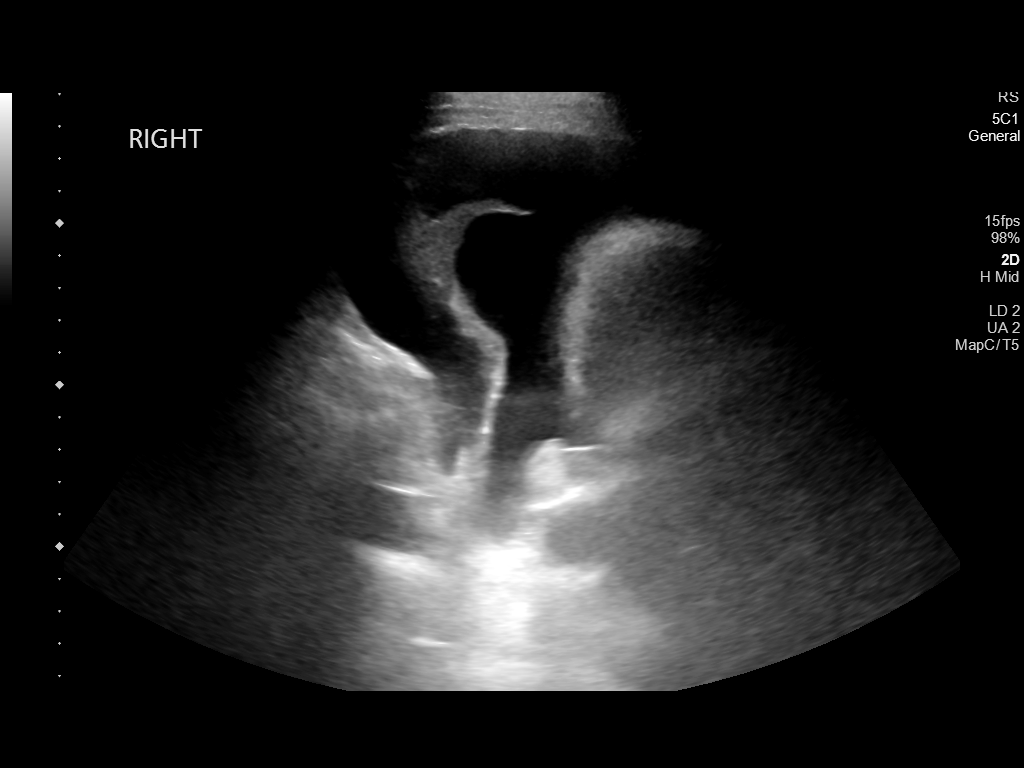
[im 2/7]
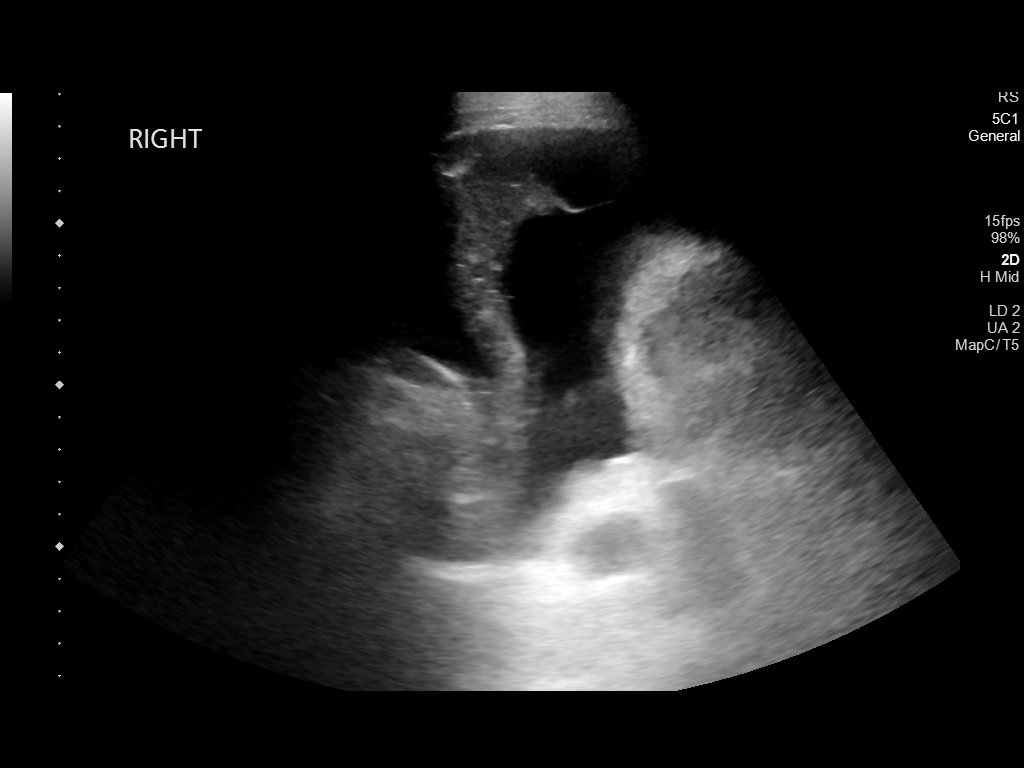
[im 3/7]
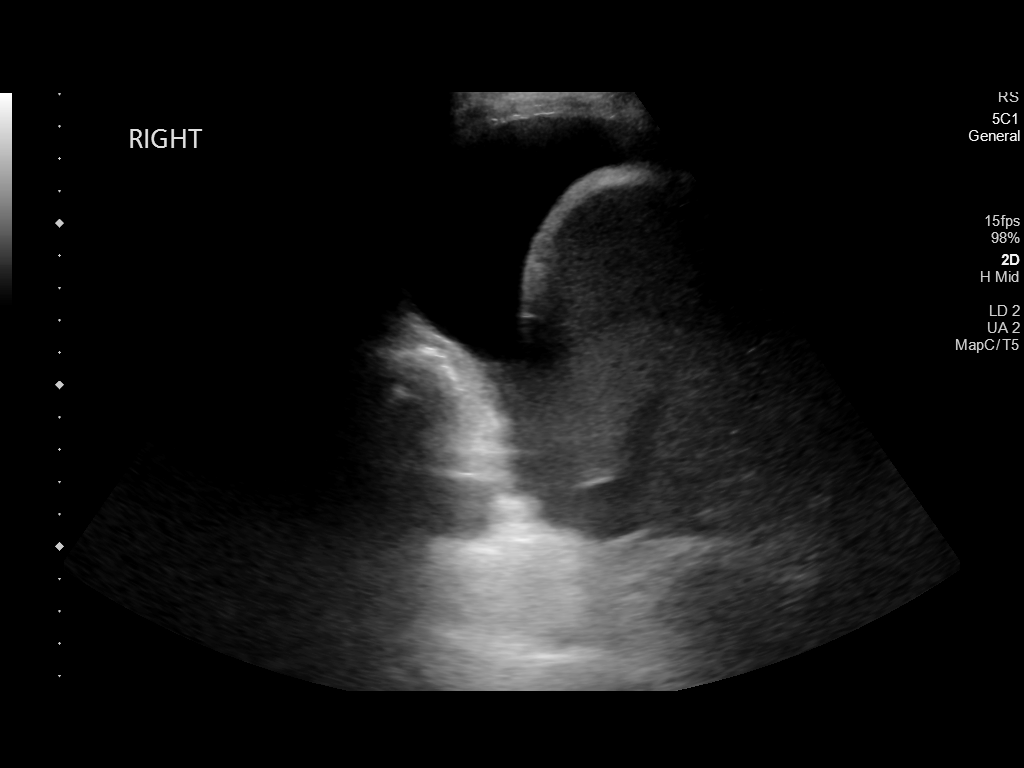
[im 4/7]
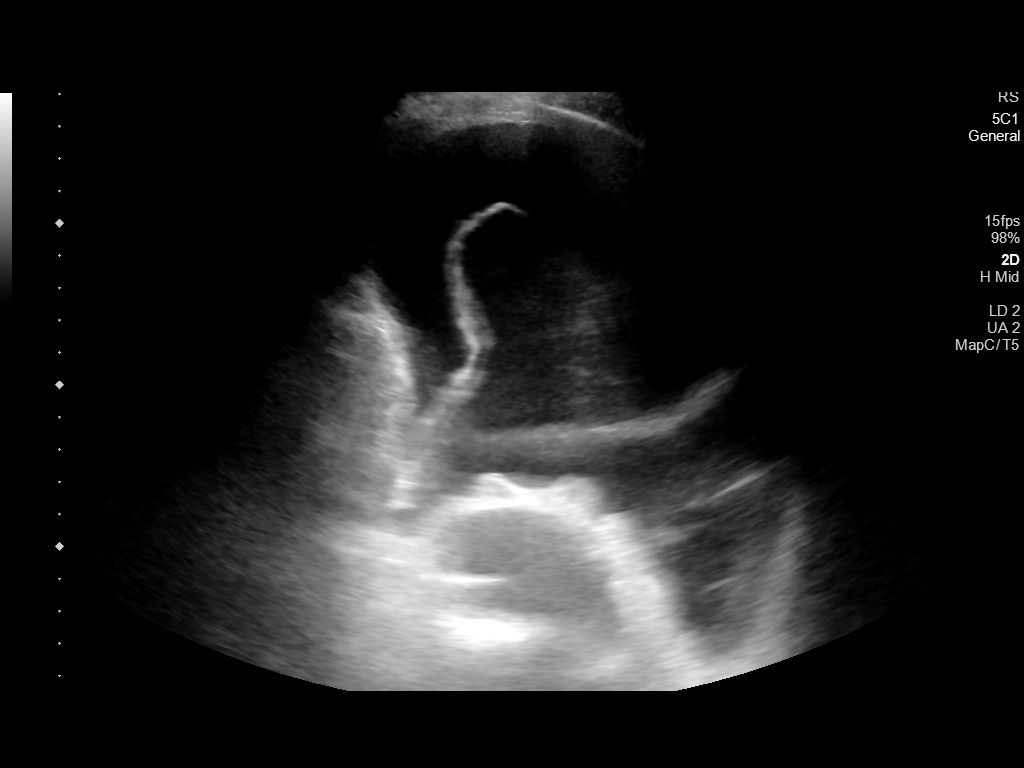
[im 5/7]
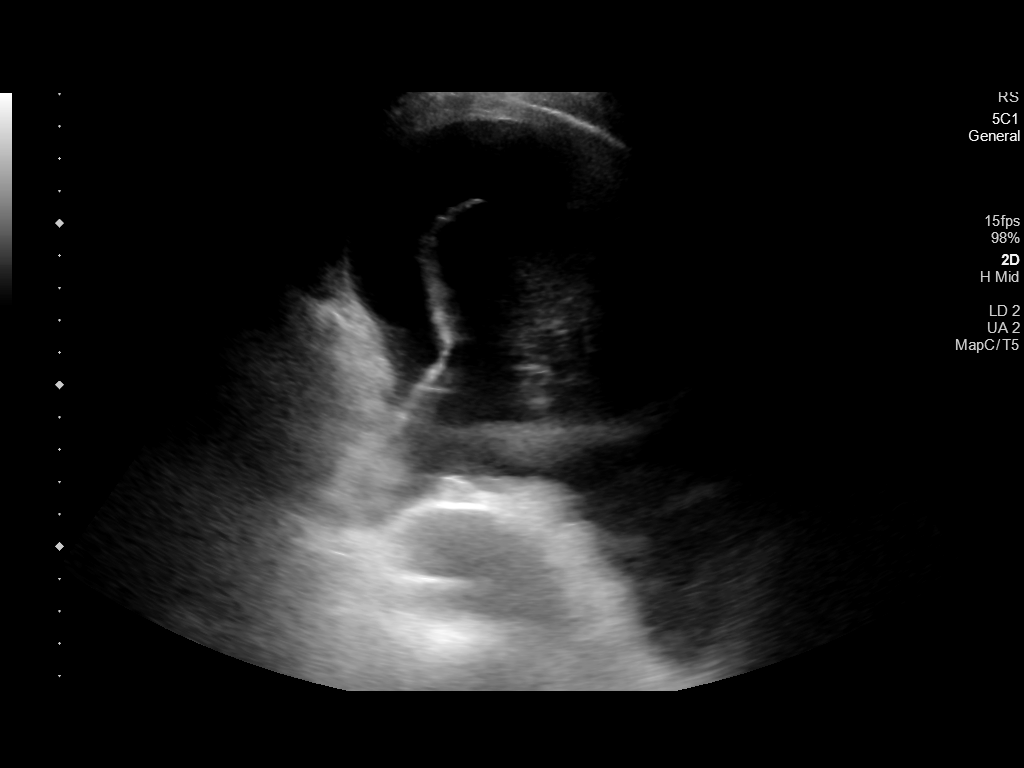
[im 6/7]
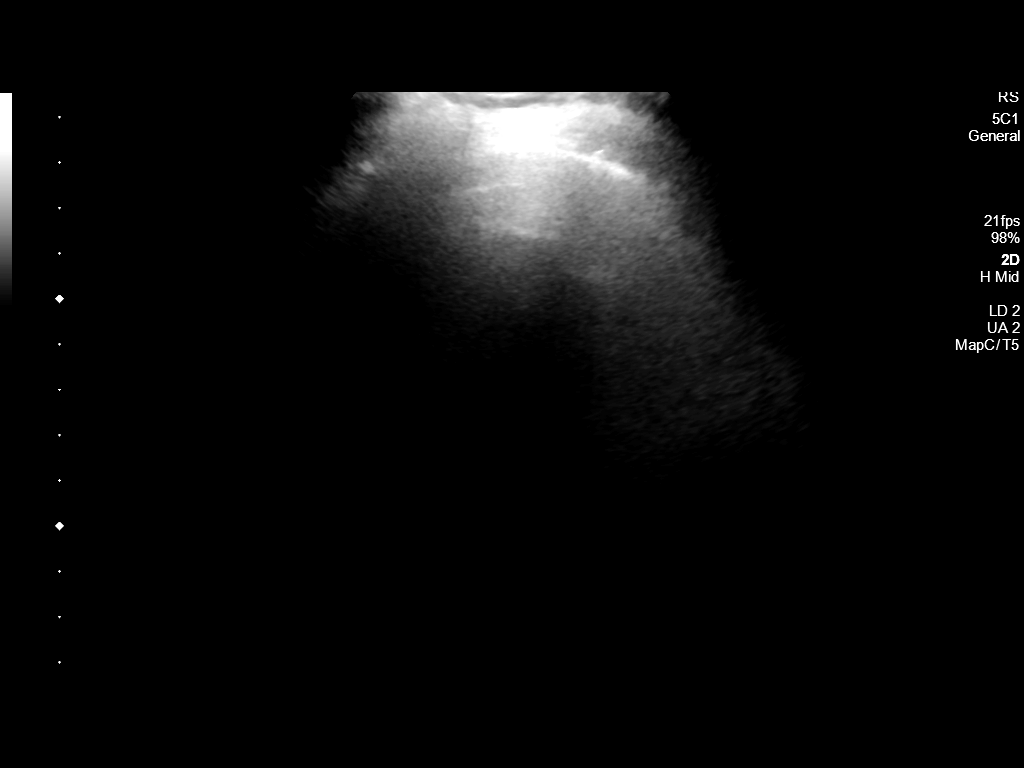
[im 7/7]
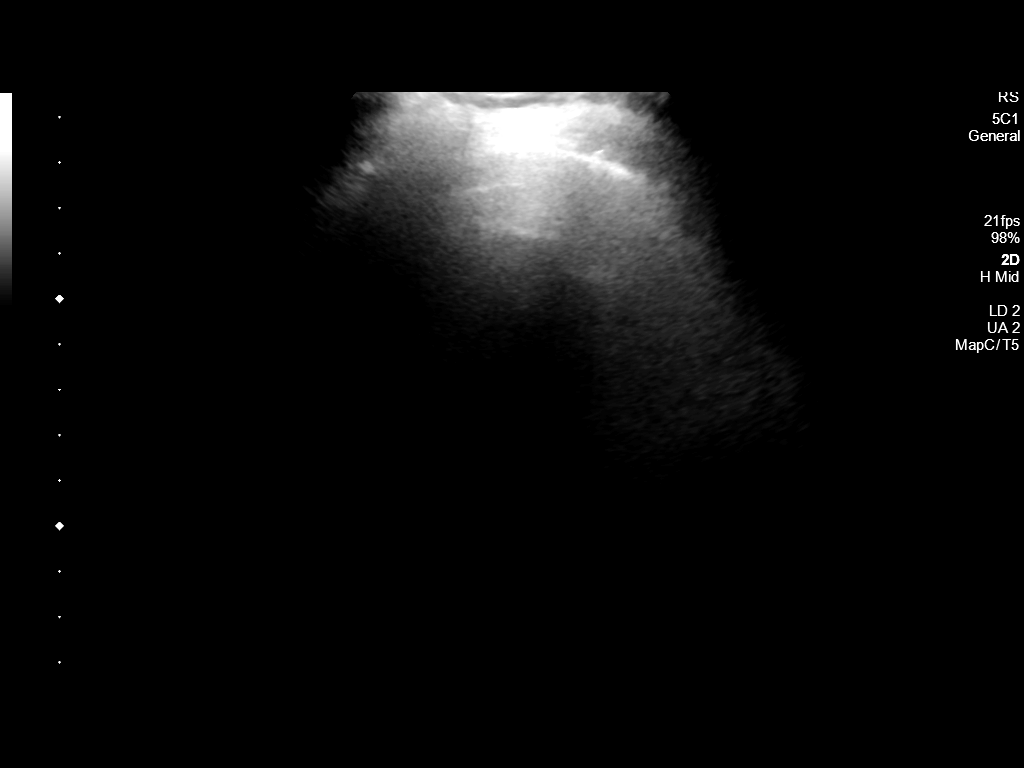

[7 of 7 positions shown; findings below may reference images not displayed]

EXAM:
ULTRASOUND GUIDED DIAGNOSTIC AND THERAPEUTIC RIGHT THORACENTESIS

MEDICATIONS:
None

COMPLICATIONS:
None immediate.

PROCEDURE:
An ultrasound guided thoracentesis was thoroughly discussed with the
patient and questions answered. The benefits, risks, alternatives
and complications were also discussed. The patient understands and
wishes to proceed with the procedure. Written consent was obtained.

Ultrasound was performed to localize and mark an adequate pocket of
fluid in the right chest. The area was then prepped and draped in
the normal sterile fashion. 1% Lidocaine was used for local
anesthesia. Under ultrasound guidance a 6 Fr Safe-T-Centesis
catheter was introduced. Thoracentesis was performed. The catheter
was removed and a dressing applied.
FINDINGS: A total of approximately 600 cc of yellow fluid was removed. Samples
were sent to the laboratory as requested by the clinical team.
IMPRESSION: Successful ultrasound guided diagnostic and therapeutic right
thoracentesis yielding 600 cc of pleural fluid.

## 2021-01-30 IMAGING — DX DG CHEST 1V
1 series · 1 of 1 positions shown · non-contrast
Comparison: 04/28/2019

CLINICAL DATA: Thoracentesis

EXAM:
CHEST  1 VIEW

[chest pa]
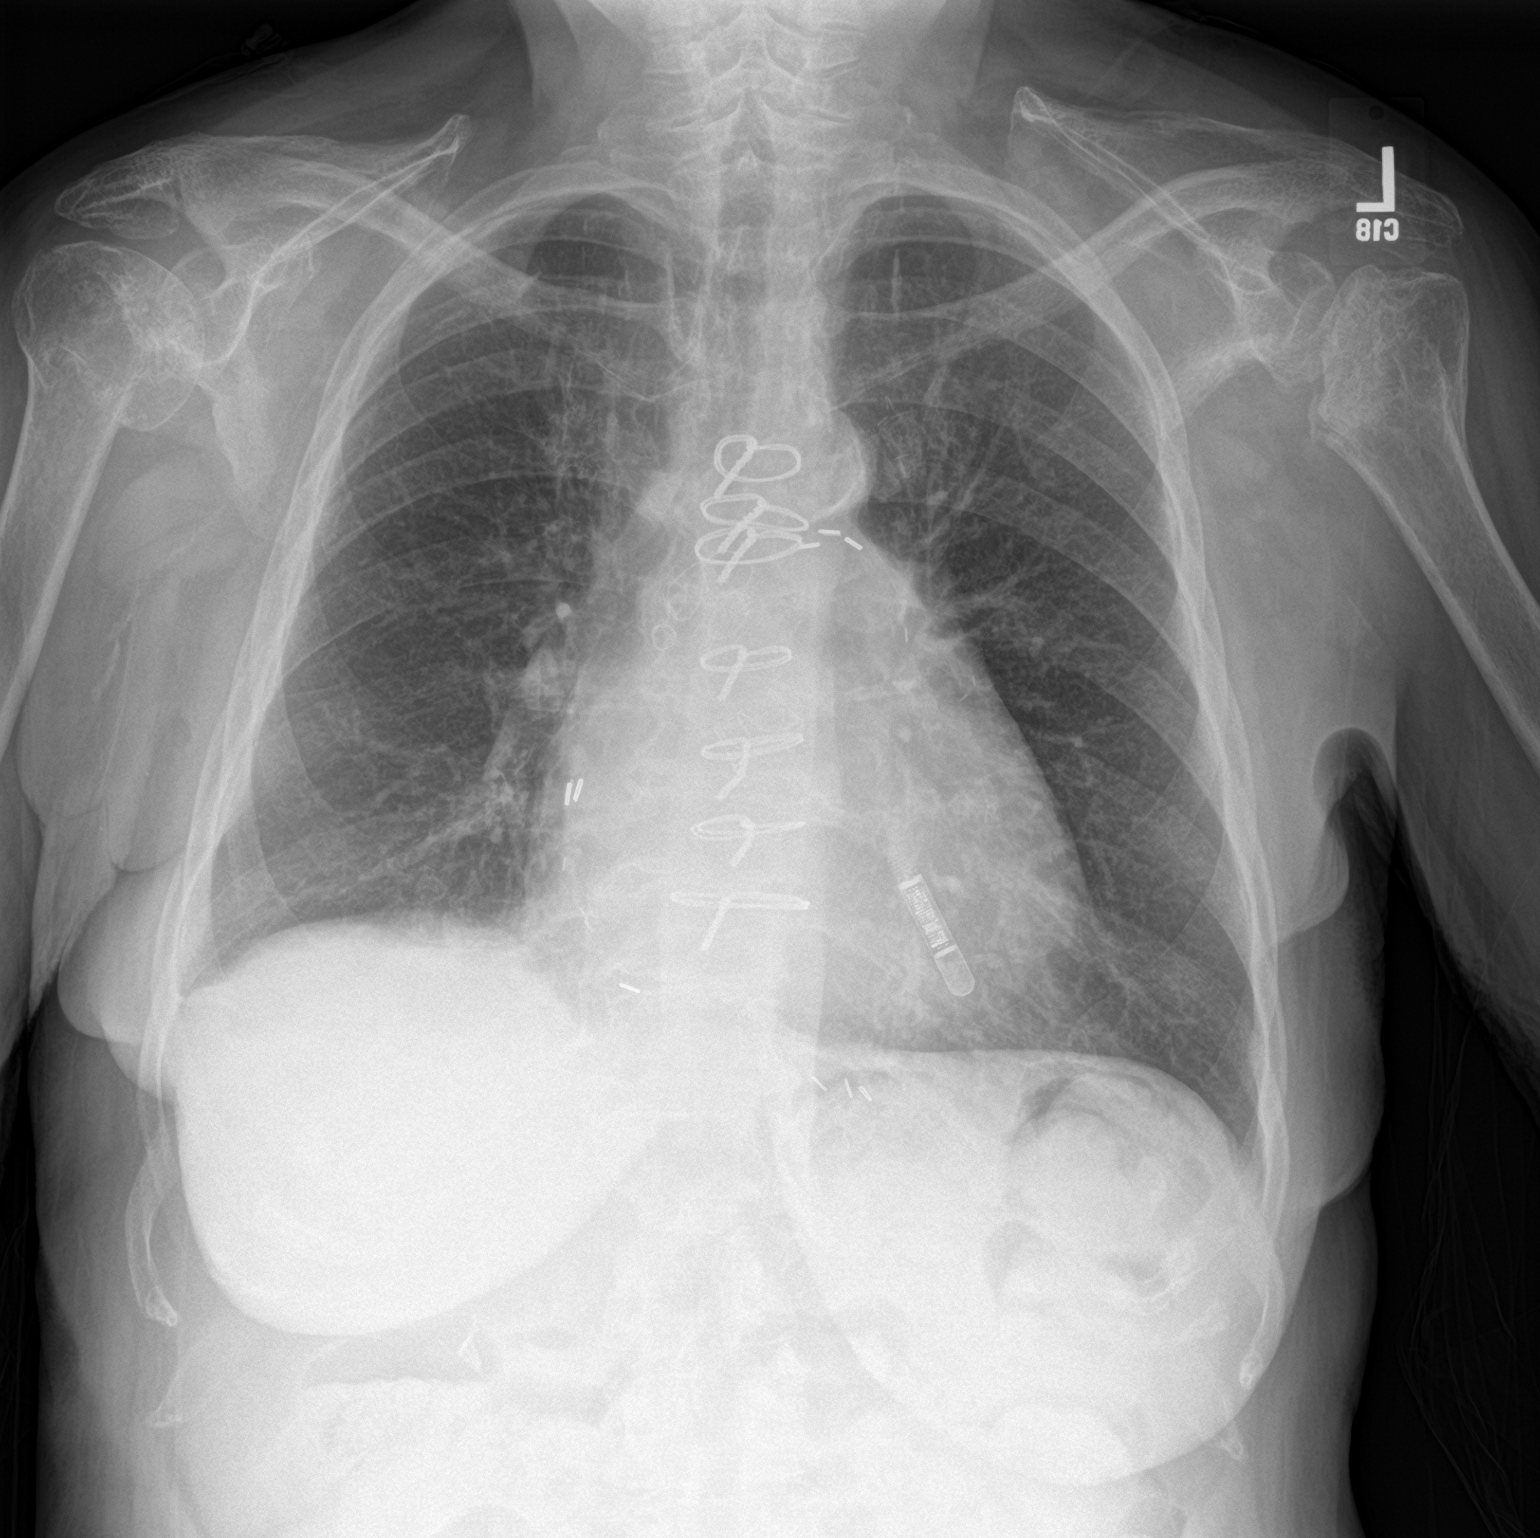

[1 of 1 positions shown; findings below may reference images not displayed]

FINDINGS: Postsurgical changes to the sternum and mediastinum. Stable loop
recorder device. Stable mild cardiomegaly. Pulmonary vasculature is
within normal limits. Mild blunting of the right costophrenic angle
may reflect trace residual pleural fluid versus scarring. Lungs are
otherwise clear. No pneumothorax.
IMPRESSION: 1. Trace residual right pleural effusion versus scarring. Otherwise
no active cardiopulmonary disease.
2. No pneumothorax seen.

## 2021-02-03 ENCOUNTER — Other Ambulatory Visit: Payer: Self-pay

## 2021-02-03 ENCOUNTER — Inpatient Hospital Stay: Payer: Medicare Other

## 2021-02-03 DIAGNOSIS — D638 Anemia in other chronic diseases classified elsewhere: Secondary | ICD-10-CM

## 2021-02-03 DIAGNOSIS — I129 Hypertensive chronic kidney disease with stage 1 through stage 4 chronic kidney disease, or unspecified chronic kidney disease: Secondary | ICD-10-CM | POA: Diagnosis not present

## 2021-02-03 LAB — CBC WITH DIFFERENTIAL (CANCER CENTER ONLY)
Abs Immature Granulocytes: 0.01 10*3/uL (ref 0.00–0.07)
Basophils Absolute: 0 10*3/uL (ref 0.0–0.1)
Basophils Relative: 1 %
Eosinophils Absolute: 0.4 10*3/uL (ref 0.0–0.5)
Eosinophils Relative: 9 %
HCT: 30.6 % — ABNORMAL LOW (ref 36.0–46.0)
Hemoglobin: 9.2 g/dL — ABNORMAL LOW (ref 12.0–15.0)
Immature Granulocytes: 0 %
Lymphocytes Relative: 11 %
Lymphs Abs: 0.5 10*3/uL — ABNORMAL LOW (ref 0.7–4.0)
MCH: 31.3 pg (ref 26.0–34.0)
MCHC: 30.1 g/dL (ref 30.0–36.0)
MCV: 104.1 fL — ABNORMAL HIGH (ref 80.0–100.0)
Monocytes Absolute: 0.5 10*3/uL (ref 0.1–1.0)
Monocytes Relative: 11 %
Neutro Abs: 3.1 10*3/uL (ref 1.7–7.7)
Neutrophils Relative %: 68 %
Platelet Count: 96 10*3/uL — ABNORMAL LOW (ref 150–400)
RBC: 2.94 MIL/uL — ABNORMAL LOW (ref 3.87–5.11)
RDW: 14.9 % (ref 11.5–15.5)
WBC Count: 4.5 10*3/uL (ref 4.0–10.5)
nRBC: 0 % (ref 0.0–0.2)

## 2021-02-03 LAB — SAMPLE TO BLOOD BANK

## 2021-02-03 LAB — FERRITIN: Ferritin: 159 ng/mL (ref 11–307)

## 2021-02-07 IMAGING — DX DG CHEST 2V
2 series · 2 of 2 positions shown · non-contrast
Comparison: 05/15/2019

CLINICAL DATA: Shortness of breath, severe anemia, metastatic
breast cancer

EXAM:
CHEST - 2 VIEW

[chest pa]
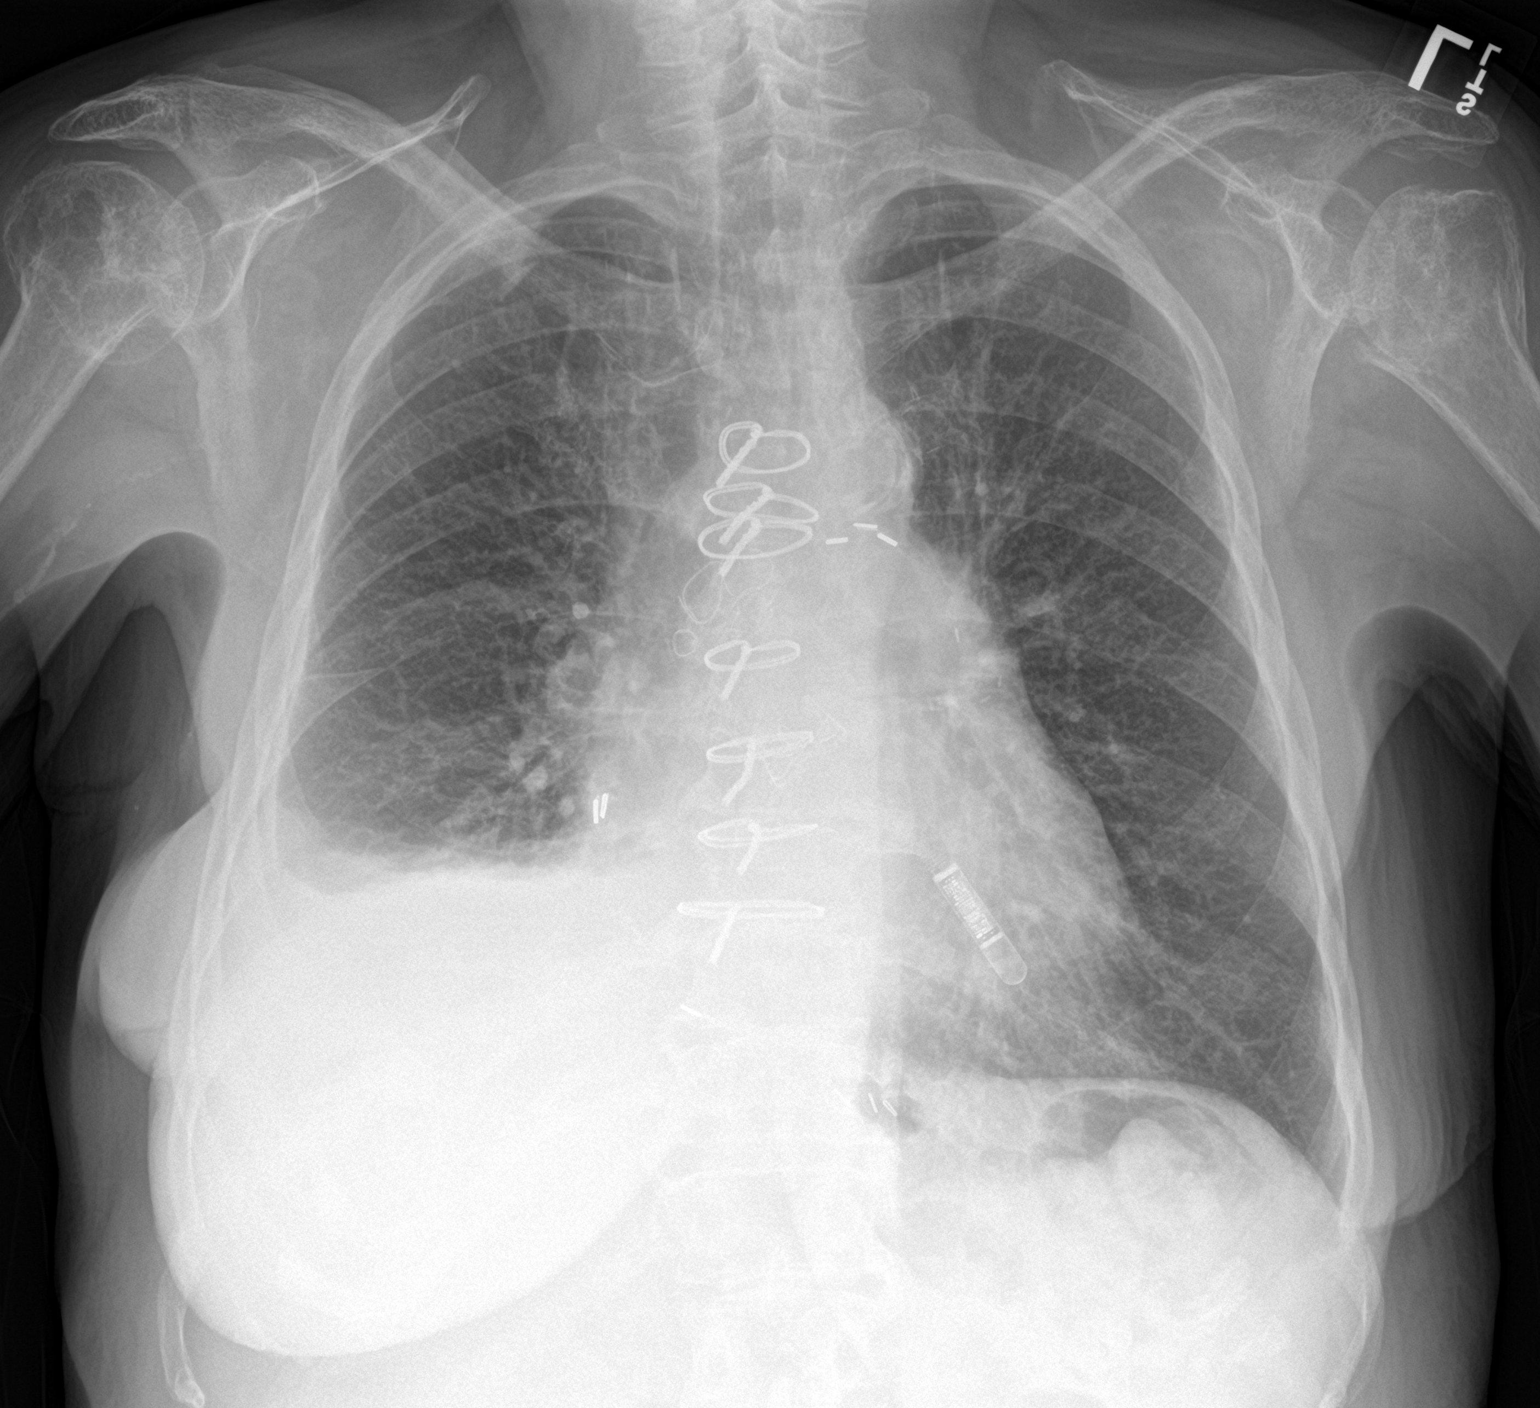

[chest lat]
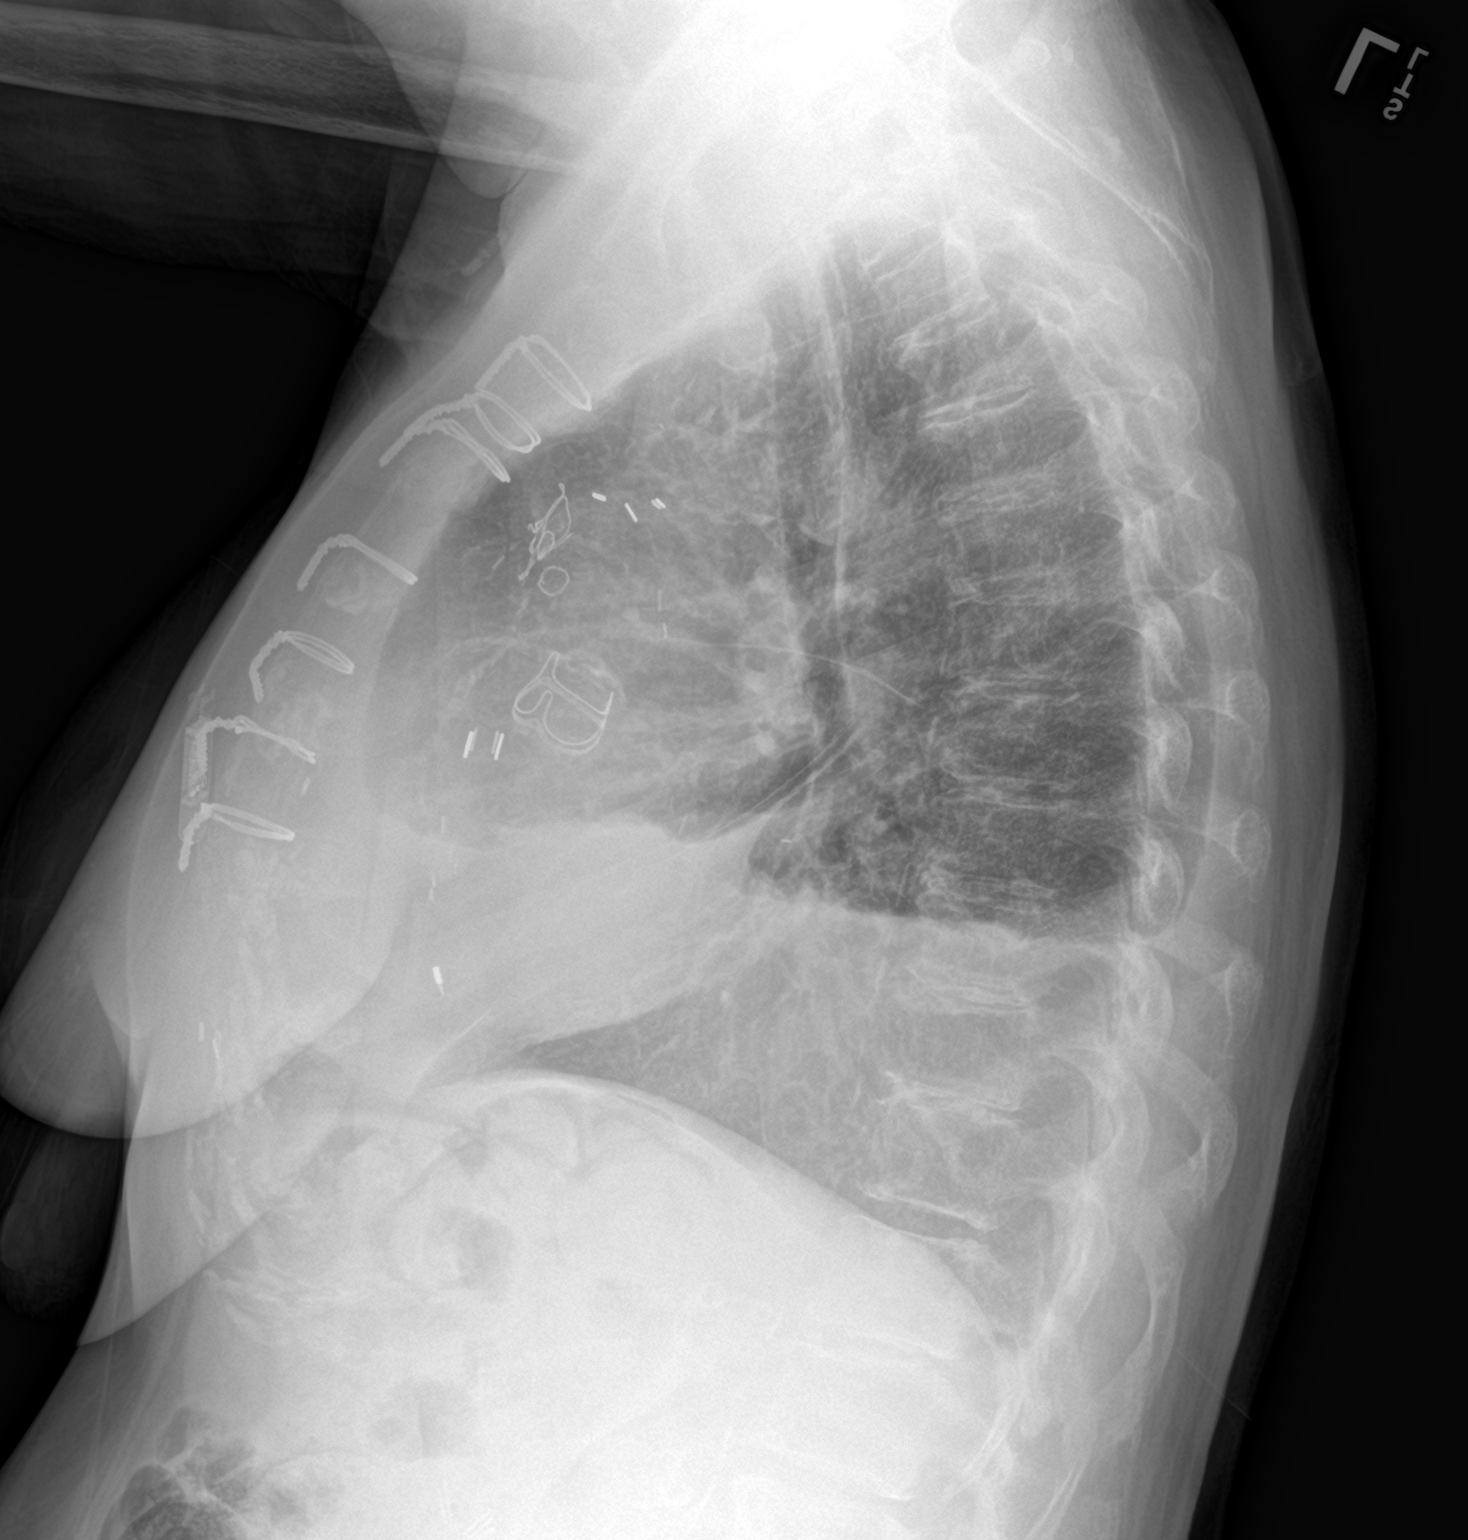

[2 of 2 positions shown; findings below may reference images not displayed]

FINDINGS: Small to moderate recurrent right pleural effusion with right lower
lung compressive atelectasis/partial collapse. Stable cardiomegaly
and previous coronary bypass changes. Remote aortic valve. No
superimposed edema pattern or CHF. Left lung remains clear. No
pneumothorax. Trachea midline. Aorta atherosclerotic. Degenerative
changes of the spine and shoulders.
IMPRESSION: Recurrent small to moderate right pleural effusion. Otherwise stable
exam.

## 2021-02-17 ENCOUNTER — Inpatient Hospital Stay: Payer: Medicare Other

## 2021-02-17 ENCOUNTER — Other Ambulatory Visit: Payer: Self-pay

## 2021-02-17 ENCOUNTER — Inpatient Hospital Stay: Payer: Medicare Other | Attending: Adult Health

## 2021-02-17 VITALS — BP 117/60 | HR 82 | Temp 98.0°F | Resp 18

## 2021-02-17 DIAGNOSIS — I129 Hypertensive chronic kidney disease with stage 1 through stage 4 chronic kidney disease, or unspecified chronic kidney disease: Secondary | ICD-10-CM | POA: Diagnosis not present

## 2021-02-17 DIAGNOSIS — D631 Anemia in chronic kidney disease: Secondary | ICD-10-CM | POA: Insufficient documentation

## 2021-02-17 DIAGNOSIS — Z79899 Other long term (current) drug therapy: Secondary | ICD-10-CM | POA: Insufficient documentation

## 2021-02-17 DIAGNOSIS — N189 Chronic kidney disease, unspecified: Secondary | ICD-10-CM | POA: Insufficient documentation

## 2021-02-17 DIAGNOSIS — D638 Anemia in other chronic diseases classified elsewhere: Secondary | ICD-10-CM

## 2021-02-17 LAB — CBC WITH DIFFERENTIAL (CANCER CENTER ONLY)
Abs Immature Granulocytes: 0.01 10*3/uL (ref 0.00–0.07)
Basophils Absolute: 0.1 10*3/uL (ref 0.0–0.1)
Basophils Relative: 1 %
Eosinophils Absolute: 0.2 10*3/uL (ref 0.0–0.5)
Eosinophils Relative: 4 %
HCT: 32 % — ABNORMAL LOW (ref 36.0–46.0)
Hemoglobin: 9.5 g/dL — ABNORMAL LOW (ref 12.0–15.0)
Immature Granulocytes: 0 %
Lymphocytes Relative: 11 %
Lymphs Abs: 0.6 10*3/uL — ABNORMAL LOW (ref 0.7–4.0)
MCH: 31 pg (ref 26.0–34.0)
MCHC: 29.7 g/dL — ABNORMAL LOW (ref 30.0–36.0)
MCV: 104.6 fL — ABNORMAL HIGH (ref 80.0–100.0)
Monocytes Absolute: 0.7 10*3/uL (ref 0.1–1.0)
Monocytes Relative: 12 %
Neutro Abs: 4.3 10*3/uL (ref 1.7–7.7)
Neutrophils Relative %: 72 %
Platelet Count: 128 10*3/uL — ABNORMAL LOW (ref 150–400)
RBC: 3.06 MIL/uL — ABNORMAL LOW (ref 3.87–5.11)
RDW: 13.6 % (ref 11.5–15.5)
WBC Count: 5.9 10*3/uL (ref 4.0–10.5)
nRBC: 0 % (ref 0.0–0.2)

## 2021-02-17 MED ORDER — DARBEPOETIN ALFA 500 MCG/ML IJ SOSY
500.0000 ug | PREFILLED_SYRINGE | Freq: Once | INTRAMUSCULAR | Status: AC
  Administered 2021-02-17: 500 ug via SUBCUTANEOUS
  Filled 2021-02-17: qty 1

## 2021-02-23 IMAGING — CR DG CHEST 1V
1 series · 1 of 1 positions shown · non-contrast
Comparison: 06/01/2019

CLINICAL DATA: Status post right thoracentesis

EXAM:
CHEST  1 VIEW

[chest ap]
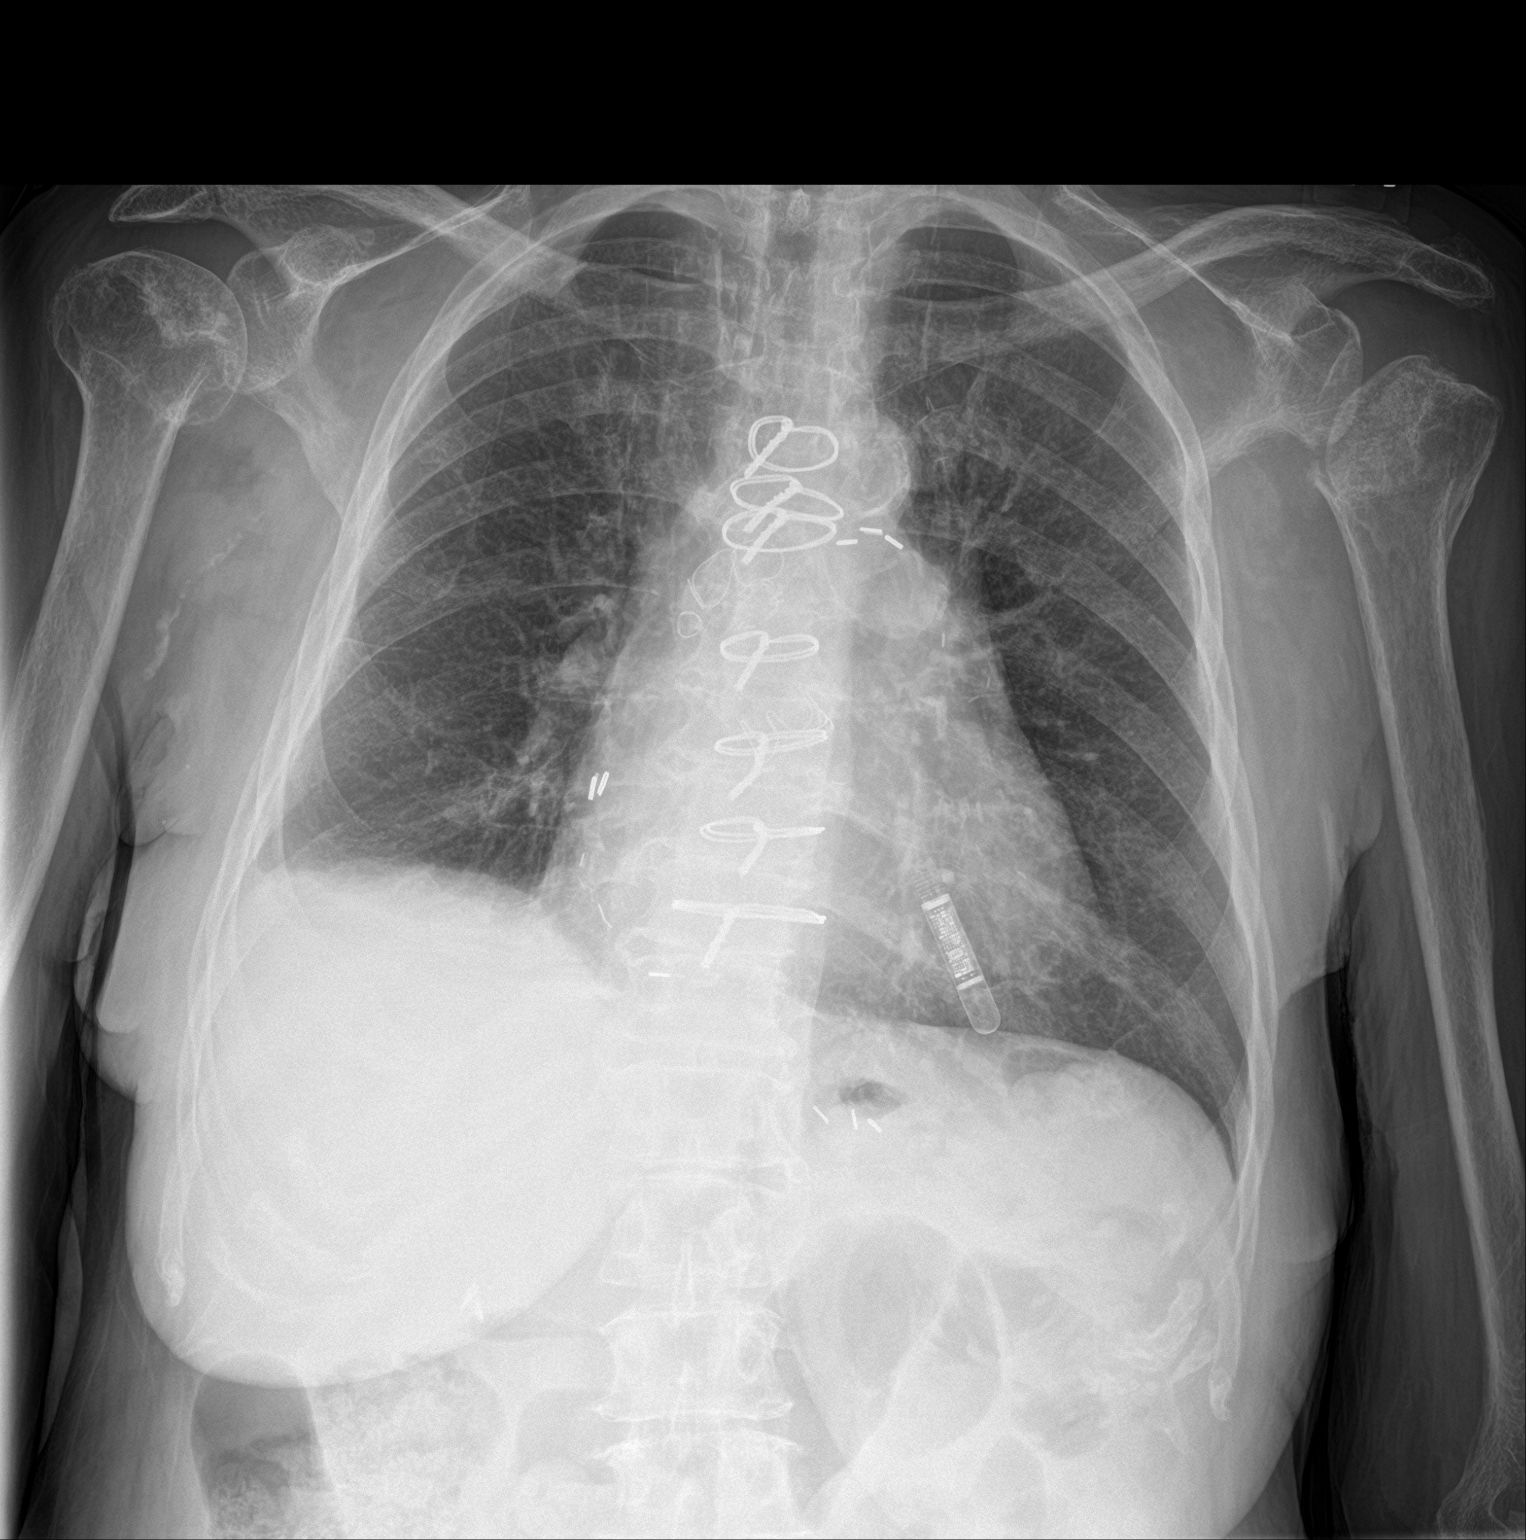

[1 of 1 positions shown; findings below may reference images not displayed]

FINDINGS: Cardiac shadow is stable. Postsurgical changes are noted. Loop
recorder is again seen. Significant reduction in right-sided pleural
effusion is noted following thoracentesis. No pneumothorax is noted.
No acute bony abnormality is seen
IMPRESSION: No pneumothorax following right thoracentesis.

## 2021-02-24 ENCOUNTER — Other Ambulatory Visit: Payer: Self-pay | Admitting: Internal Medicine

## 2021-03-04 IMAGING — DX DG CHEST 2V
2 series · 2 of 2 positions shown · non-contrast
Comparison: 06/08/2019

CLINICAL DATA: Follow-up recurrent pleural effusion.

EXAM:
CHEST - 2 VIEW

[dg chest 2 view (1 of 2)]
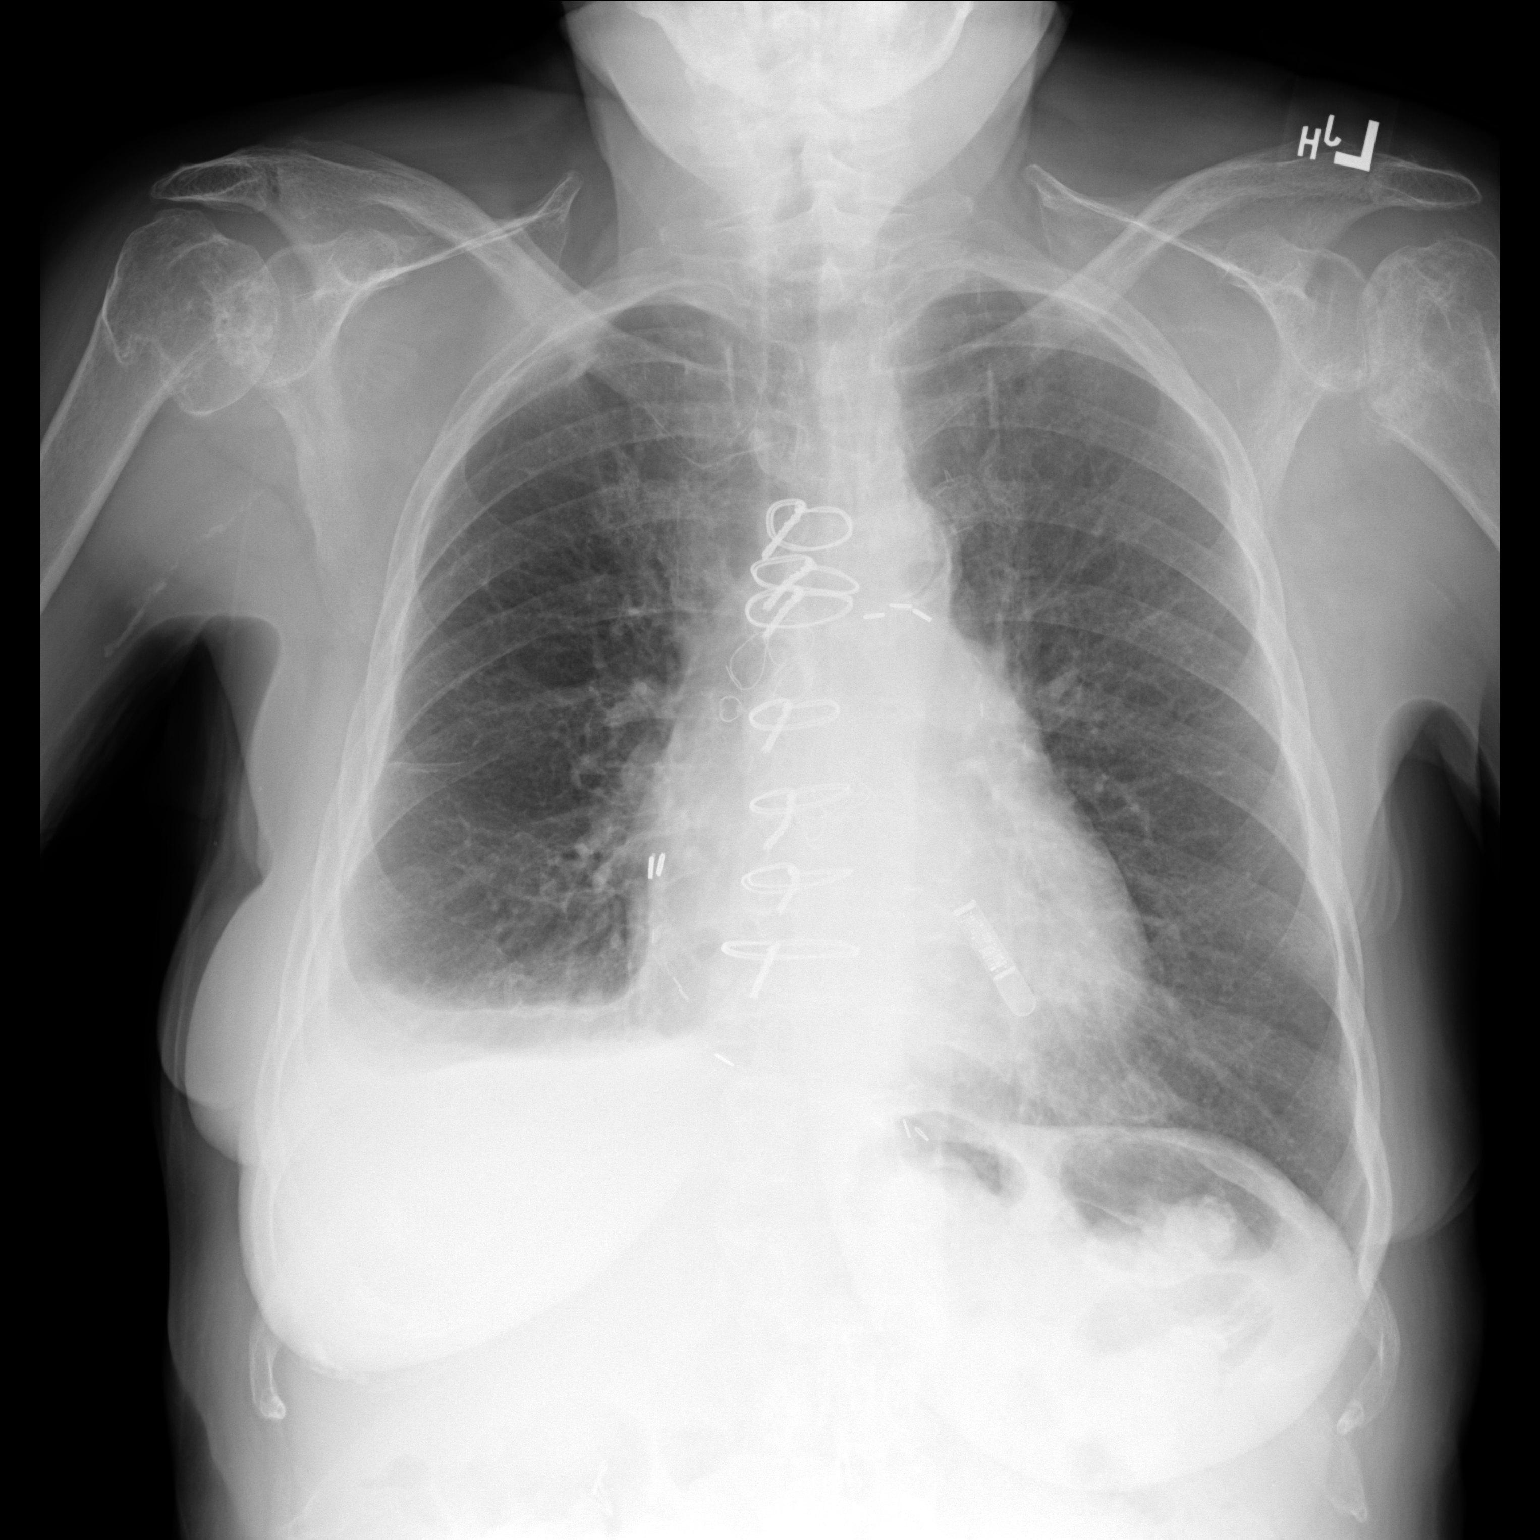

[dg chest 2 view (2 of 2)]
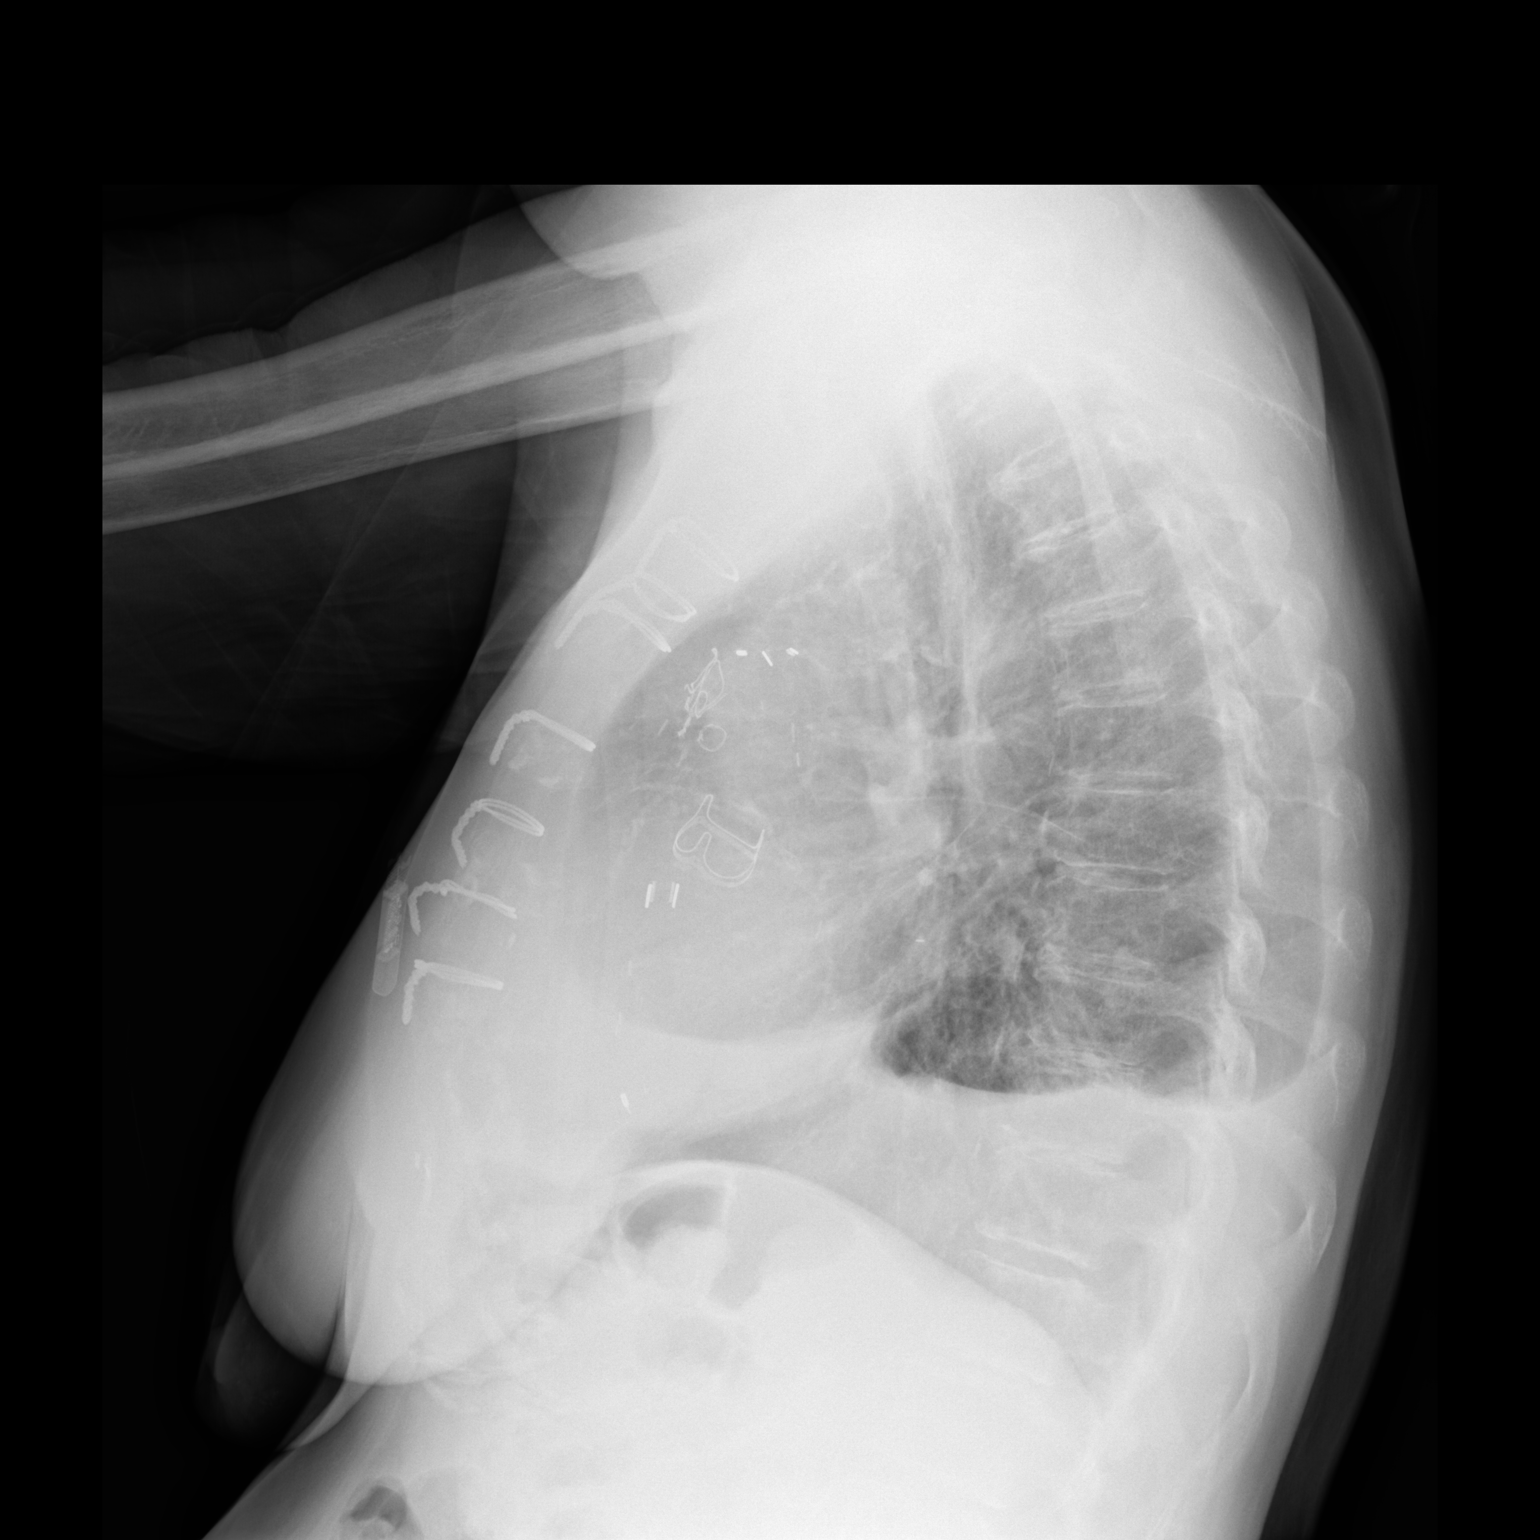

[2 of 2 positions shown; findings below may reference images not displayed]

FINDINGS: Previous median sternotomy and CABG. The left chest remains clear.
On the right, there is a persistent small to moderate right
effusion, similar to the study of [DATE] on the frontal projection.
Mild volume loss at the right base secondary to that. Upper chest
appears clear. Aortic atherosclerotic calcification is noted. Loop
recorder in place. Previous aortic valve replacement.
IMPRESSION: Persistent small to moderate right effusion layering dependently
with mild right base atelectasis. Similar appearance on the frontal
view compared to the study of 06/08/2019. Effusion is smaller than
was seen on 06/01/2019.

## 2021-03-06 ENCOUNTER — Other Ambulatory Visit: Payer: Self-pay | Admitting: Internal Medicine

## 2021-03-06 NOTE — Telephone Encounter (Signed)
Eliquis 2.5 mg refill request received. Patient is 75 years old, weight-64.6 kg, Crea- 3.09on via , Diagnosis-PAF, and last seen by Dr. Lovena Le on 11/15/20. Dose is appropriate based on dosing criteria. Will send in refill to requested pharmacy.

## 2021-03-11 IMAGING — CR DG CHEST 2V
2 series · 2 of 2 positions shown · non-contrast
Comparison: 06/17/2019 chest radiograph.

CLINICAL DATA: Preoperative for PleurX catheter placement

EXAM:
CHEST - 2 VIEW

[w chest pa]
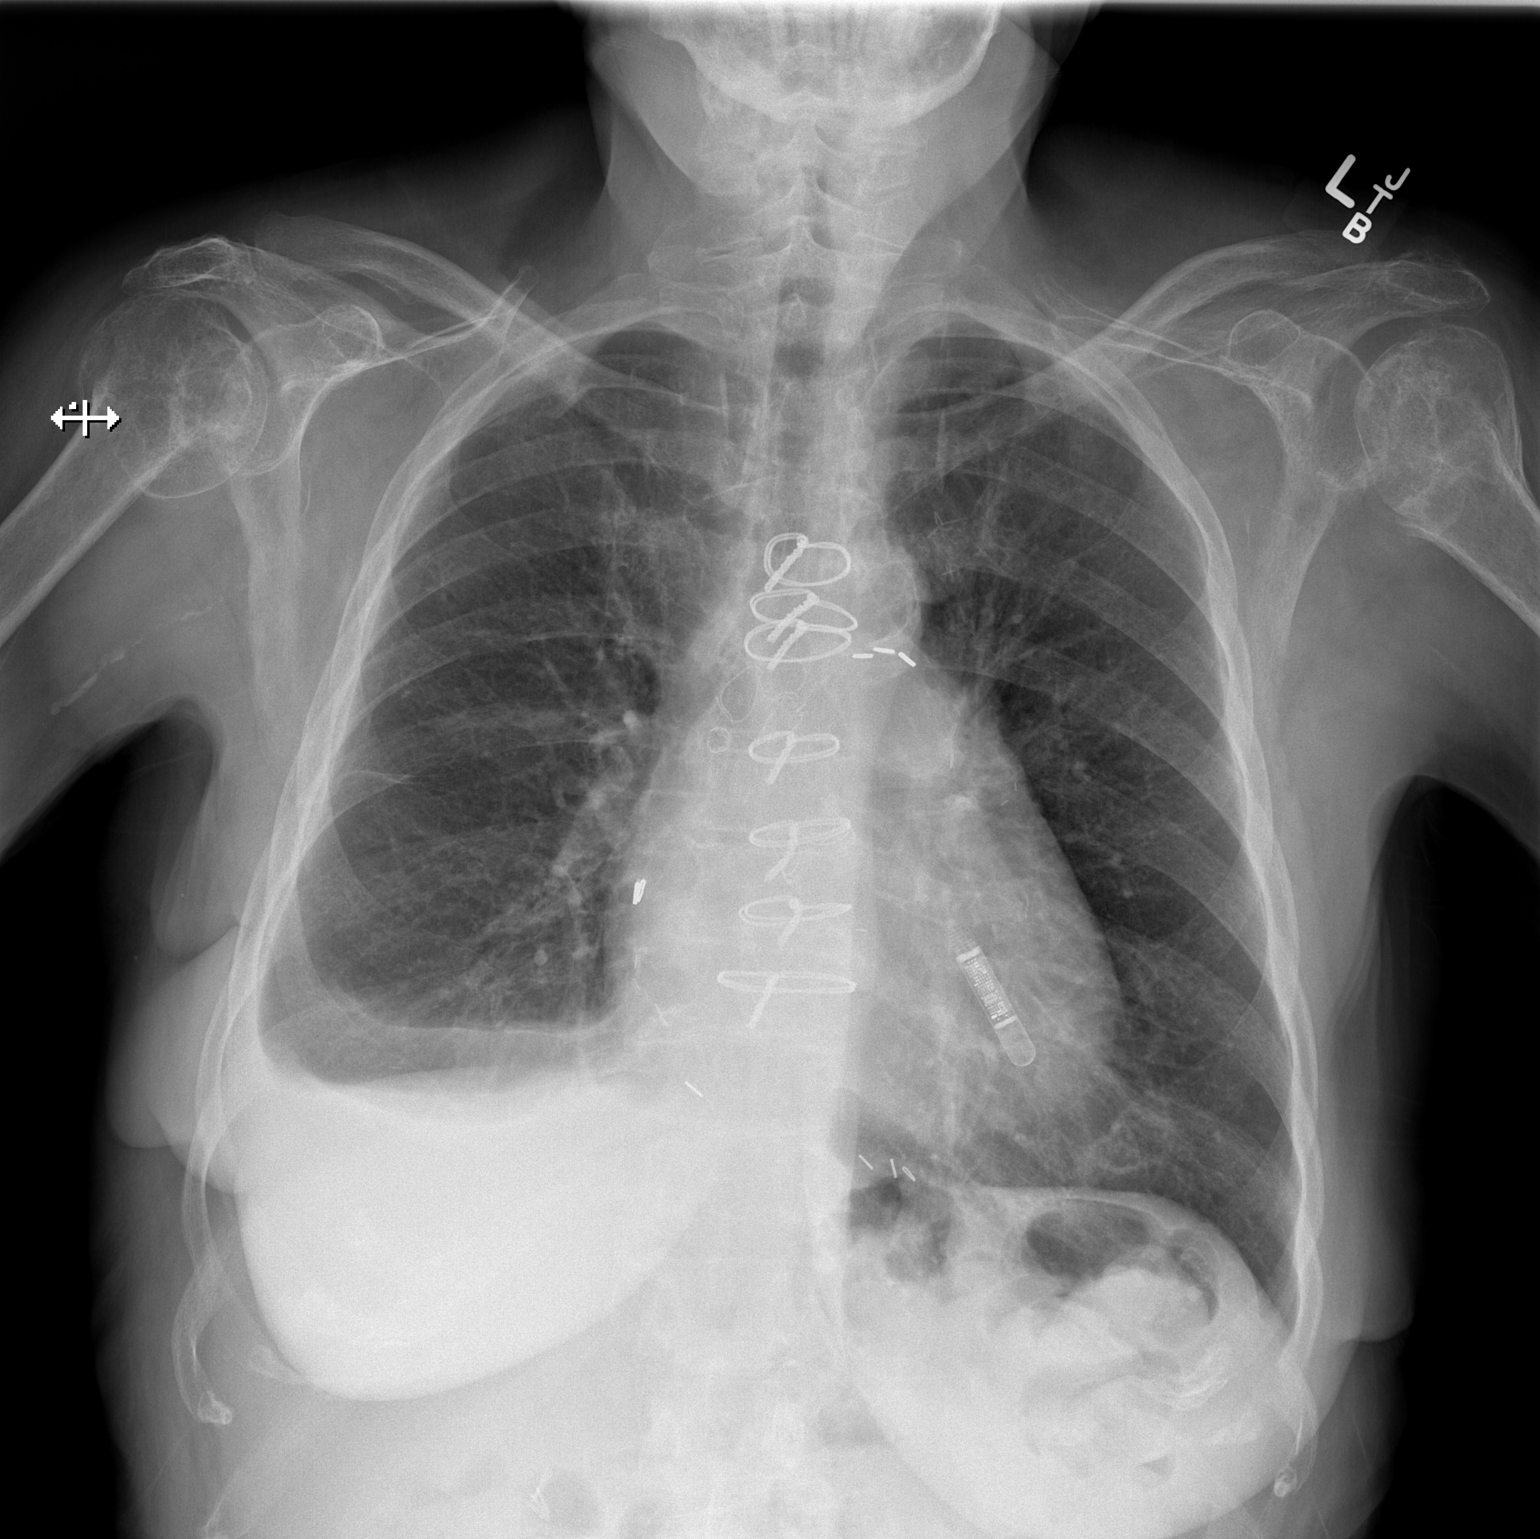

[w chest lat]
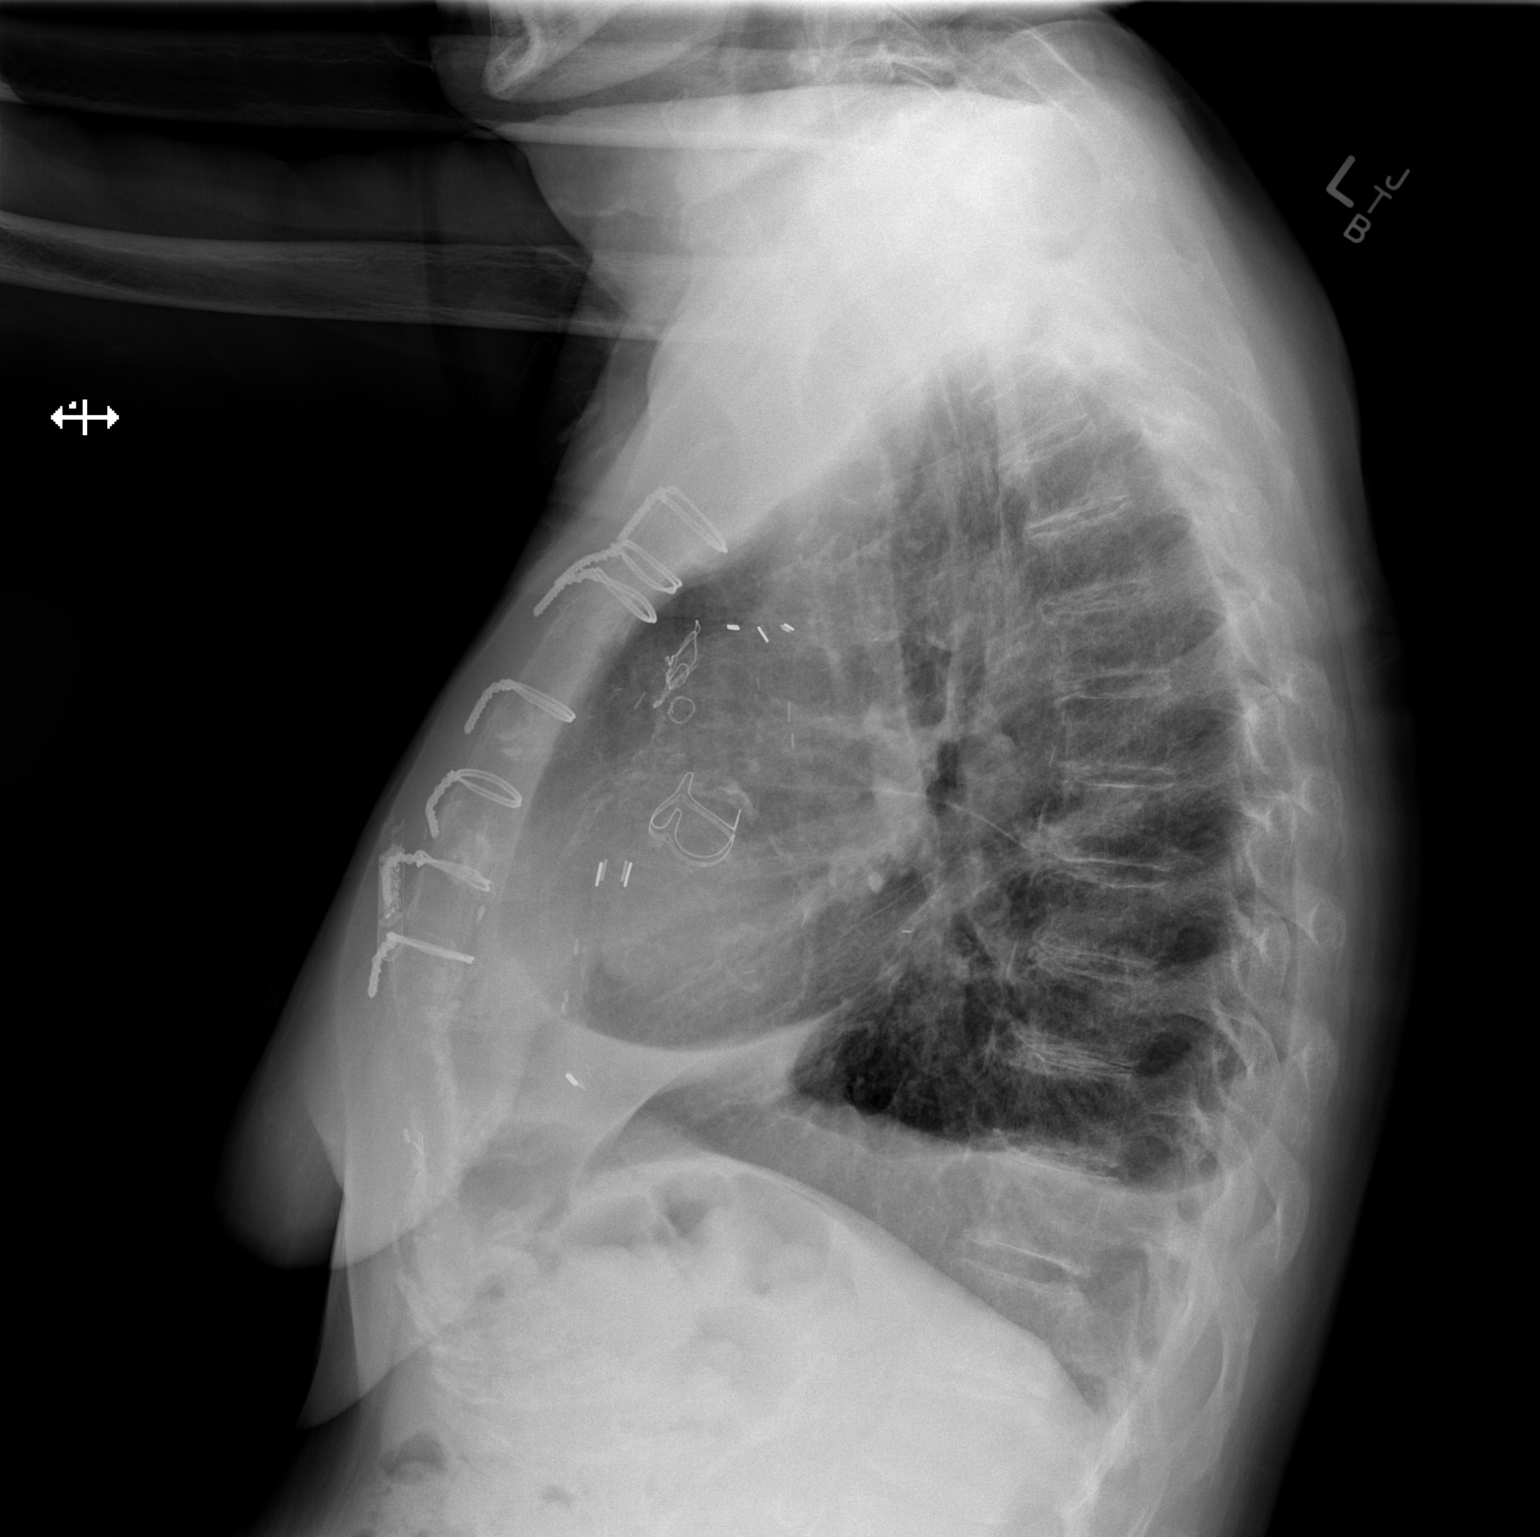

[2 of 2 positions shown; findings below may reference images not displayed]

FINDINGS: Intact sternotomy wires. Cardiac valvular prosthesis and loop
recorder overlies the heart. Stable cardiomediastinal silhouette
with mild cardiomegaly. No pneumothorax. Stable small right pleural
effusion. No left pleural effusion. No pulmonary edema. Mild right
basilar atelectasis. No acute consolidative airspace disease.
IMPRESSION: 1. Stable small right pleural effusion with mild right basilar
atelectasis.
2. Stable mild cardiomegaly without pulmonary edema.

## 2021-03-13 ENCOUNTER — Encounter (HOSPITAL_COMMUNITY): Payer: Self-pay

## 2021-03-13 IMAGING — DX DG CHEST 1V PORT
1 series · 1 of 1 positions shown · non-contrast
Comparison: 06/24/2019 and older studies.

CLINICAL DATA: Postop.  Pleural effusion.  Follow-up exam.

EXAM:
PORTABLE CHEST 1 VIEW

[chest ap]
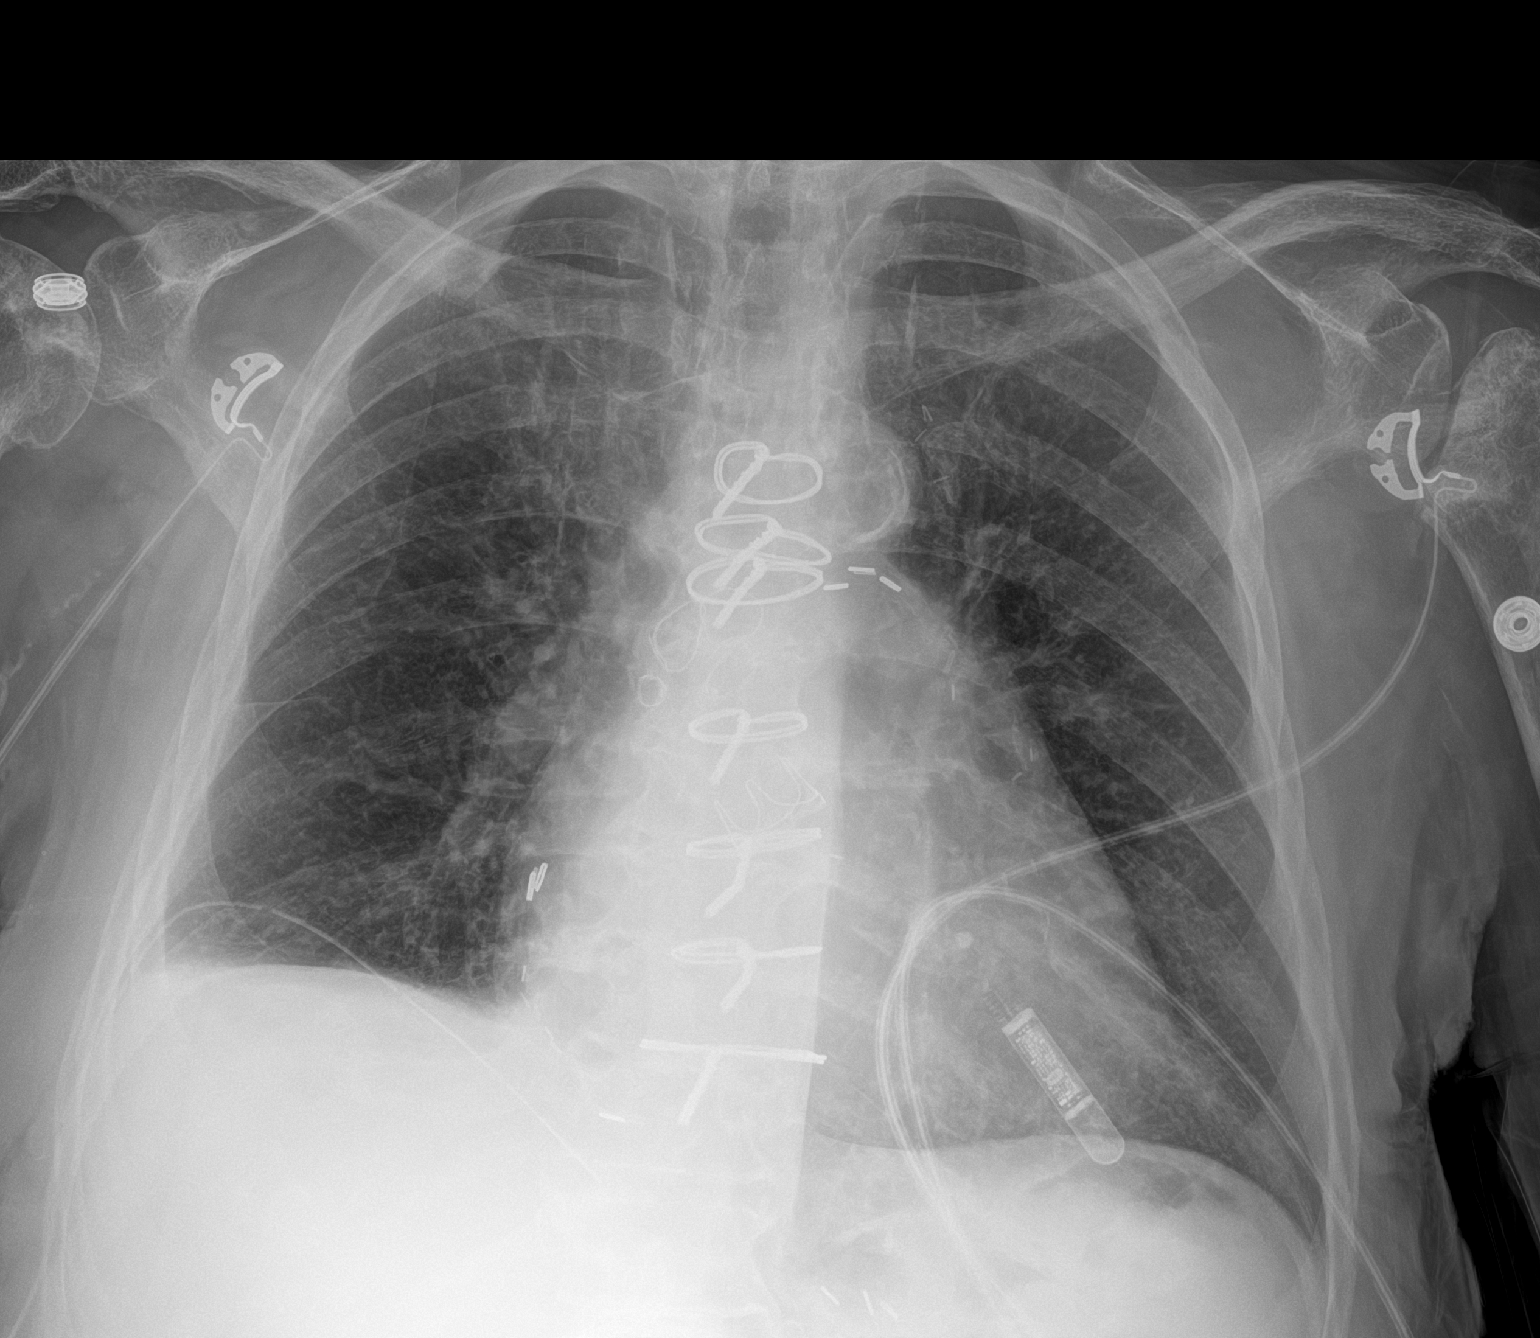

[1 of 1 positions shown; findings below may reference images not displayed]

FINDINGS: Small right pleural effusion partly obscures the right
hemidiaphragm. Fusion is smaller than on the prior study. There is a
right inferior hemithorax chest tube, tip projected towards the
medial right costophrenic sulcus, new since the prior exam.

No pneumothorax.

No left pleural effusion. Lungs are hyperexpanded with prominent
bronchovascular markings, stable. No evidence of pneumonia or
pulmonary edema.

Stable changes from previous cardiac surgery and aortic valve
replacement. No mediastinal or hilar masses.

Chronic resorption of the left humeral head. No acute skeletal
abnormality.
IMPRESSION: 1. New right inferior me thorax chest tube with an interval decrease
in the size of the small right pleural effusion.
2. No new abnormalities. No evidence of pneumonia or pulmonary
edema.
3. No pneumothorax.

## 2021-03-15 NOTE — Telephone Encounter (Signed)
Pt returned call and is scheduled

## 2021-03-16 NOTE — Progress Notes (Signed)
Patient Care Team: [name], DO as PCP - General [name], NP (Inactive) as PCP - Cardiology (Nurse Practitioner) Evans Lance, MD as PCP - Electrophysiology (Cardiology) Larey Dresser, MD as PCP - Advanced Heart Failure (Cardiology) Alphonsa Overall, MD as Consulting Physician (General Surgery) Nicholas Lose, MD as Consulting Physician (Hematology and Oncology) Gery Pray, MD as Consulting Physician (Radiation Oncology) Evans Lance, MD as Consulting Physician (Cardiology) Collene Gobble, MD as Consulting Physician (Pulmonary Disease) Elayne Snare, MD as Consulting Physician (Endocrinology) Delice Bison, Charlestine Massed, NP as Nurse Practitioner (Hematology and Oncology) Ardelle Balls., MD (Neurology) Izora Gala, MD as Consulting Physician (Otolaryngology) Trula Slade, DPM as Consulting Physician (Podiatry) Delice Lesch Lezlie Octave, MD as Consulting Physician (Neurology)  DIAGNOSIS:    ICD-10-CM   1. Anemia of chronic disease  D63.8       SUMMARY OF ONCOLOGIC HISTORY: Oncology History  Breast cancer of upper-inner quadrant of right female breast ([place])  12/14/2015 Initial Diagnosis   Right breast biopsy 12:30 position: 2 masses, 1.6 cm mass: Invasive ductal carcinoma, grade 2, ER 0%, PR 0%, HER-2 negative, Ki-67 70%; satellite mass 8 mm: IDC grade 2 ER 5%, PR 5%, HER-2 negative, Ki-67 50%; T1cN0 stage IA clinical stage   01/18/2016 Surgery   Right lumpectomy: Multifocal IDC grade 3, 1.9 cm  ER 0%, PR 0%, HER-2 negative, Ki-67 70% and 0.8 cm satellite mass ER 5%, PR 2%, HER-2 negative, Ki-67 50%, high-grade DCIS, margins negative, 0/1 lymph nodes negative T1 cN0 stage IA   02/17/2016 - 04/06/2016 Chemotherapy   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    04/08/2016 - 04/23/2016 Hospital Admission   Hosp adm for cellulitis   05/24/2016 - 07/25/2016 Radiation Therapy    Adj XRT   06/29/2016 - 07/04/2016 Hospital  Admission   Seizure like activity, MRI brain and EEG unremarkable    08/16/2016 -  Anti-estrogen oral therapy   Letrozole could not tolerate it due to dizziness and lightheadedness, switched to anastrozole 04/04/2017 switched to exemestane 05/22/2017     CHIEF COMPLIANT: Follow-up of breast cancer and severe anemia of CKD  INTERVAL HISTORY: TAMELIA MICHALOWSKI is a 75 y.o. with above-mentioned history of cancer and severe anemia. She presents to the clinic today for follow-up.  She is receiving erythropoietin stimulating agents once a month and it appears to be holding her hemoglobin up in a reasonable level.  She has good days and bad days with regards to her fatigue.  The main complaint today is neuropathy in her feet sometimes she falls because of instability in the gait.  She has had foot drop as well.  ALLERGIES:  is allergic to amoxicillin, tape, aldactone [spironolactone], isosorbide nitrate, k-flex [orphenadrine], arimidex [anastrozole], latex, and tetracycline.  MEDICATIONS:  Current Outpatient Medications  Medication Sig Dispense Refill   DULoxetine (CYMBALTA) 20 MG capsule Take 1 capsule (20 mg total) by mouth daily. 30 capsule 3   Accu-Chek FastClix Lancets MISC Use to check blood sugar 3 times per day 100 each 5   ACCU-CHEK GUIDE test strip Use to test blood sugar 3 times daily 150 strip 3   acetaminophen (TYLENOL) 500 MG tablet Take 1,000 mg by mouth as needed for moderate pain.     albuterol (PROVENTIL) (2.5 MG/3ML) 0.083% nebulizer solution Take 3 mLs (2.5 mg total) by nebulization every 6 (six) hours as needed for wheezing or shortness of breath. 75 mL 3   albuterol (  VENTOLIN HFA) 108 (90 Base) MCG/ACT inhaler Inhale 2 puffs into the lungs every 6 (six) hours as needed for wheezing or shortness of breath. 6.7 g 10   amiodarone (PACERONE) 200 MG tablet TAKE ONE TABLET BY MOUTH DAILY 90 tablet 2   Blood Glucose Monitoring Suppl (ACCU-CHEK GUIDE ME) w/Device KIT Use accu chek meter  to check blood sugar three times daily. 1 kit 0   cetirizine (ZYRTEC) 10 MG tablet Take 10 mg by mouth daily.     Cholecalciferol (DIALYVITE VITAMIN D 5000) 125 MCG (5000 UT) capsule Take 5,000 Units by mouth daily.     Continuous Blood Gluc Receiver (FREESTYLE LIBRE 2 READER) DEVI Use to check blood sugars daily 1 each 0   Continuous Blood Gluc Sensor (FREESTYLE LIBRE 2 SENSOR) MISC Use to check blood sugars 4 times a day. 6 each 2   ELIQUIS 2.5 MG TABS tablet TAKE ONE TABLET BY MOUTH TWICE DAILY 180 tablet 1   exemestane (AROMASIN) 25 MG tablet TAKE ONE TABLET BY MOUTH DAILY AFTER BREAKFAST 90 tablet 3   ezetimibe (ZETIA) 10 MG tablet Take 1 tablet (10 mg total) by mouth daily. 90 tablet 3   famotidine (PEPCID) 20 MG tablet TAKE ONE TABLET BY MOUTH TWICE DAILY. ONE AFTER SUPPER 60 tablet 11   furosemide (LASIX) 20 MG tablet Take 20 mg by mouth daily as needed for fluid.     HUMALOG KWIKPEN 100 UNIT/ML KwikPen INJECT SUBCUTANEOUSLY 4 TO  6 UNITS 3 TIMES DAILY  BEFORE MEALS (Patient taking differently: Inject 6-8 Units into the skin 3 (three) times daily.) 30 mL 3   insulin glargine (LANTUS) 100 UNIT/ML injection Inject 20 Units into the skin daily.      Insulin Pen Needle (BD PEN NEEDLE NANO U/F) 32G X 4 MM MISC USE AS INSTRUCTED TO INJECT INSULIN 4 TIMES DAILY. 360 each 1   LANTUS SOLOSTAR 100 UNIT/ML Solostar Pen INJECT SUBCUTANEOUSLY 16  UNITS DAILY IN THE MORNING 15 mL 3   metoprolol succinate (TOPROL-XL) 25 MG 24 hr tablet Take 0.5 tablets (12.5 mg total) by mouth daily. 45 tablet 3   nitroGLYCERIN (NITROSTAT) 0.4 MG SL tablet Place 1 tablet (0.4 mg total) under the tongue every 5 (five) minutes as needed for chest pain. 25 tablet 3   ondansetron (ZOFRAN ODT) 4 MG disintegrating tablet Take 1 tablet (4 mg total) by mouth every 8 (eight) hours as needed. 10 tablet 0   rosuvastatin (CRESTOR) 40 MG tablet Take 1 tablet (40 mg total) by mouth at bedtime. 90 tablet 3   SYMBICORT 160-4.5 MCG/ACT  inhaler INHALE TWO PUFFS INTO THE LUNGS TWICE DAILY 30.8 g 5   TRULICITY 1.5 MV/7.8IO SOPN INJECT THE CONTENTS OF ONE  PEN SUBCUTANEOUSLY WEEKLY  AS DIRECTED 6 mL 3   vitamin B-12 (CYANOCOBALAMIN) 100 MCG tablet Take 100 mcg by mouth daily.     zolpidem (AMBIEN) 5 MG tablet Take 7.5 mg by mouth at bedtime as needed for sleep.     No current facility-administered medications for this visit.    PHYSICAL EXAMINATION: ECOG PERFORMANCE STATUS: 2 - Symptomatic, <50% confined to bed  Vitals:   03/17/21 1106  BP: (!) 119/53  Pulse: 69  Resp: 16  Temp: 97.7 F (36.5 C)  SpO2: 100%   Filed Weights   03/17/21 1106  Weight: 142 lb 9.6 oz (64.7 kg)     LABORATORY DATA:  I have reviewed the data as listed CMP Latest Ref Rng & Units 01/25/2021  11/18/2020 11/11/2020  Glucose 70 - 99 mg/dL 153(H) 110(H) 218(H)  BUN 6 - 23 mg/dL 47(H) 45(H) 40(H)  Creatinine 0.40 - 1.20 mg/dL 3.09(H) 2.43(H) 2.30(H)  Sodium 135 - 145 mEq/L 139 140 140  Potassium 3.5 - 5.1 mEq/L 5.0 4.9 4.5  Chloride 96 - 112 mEq/L 106 107 108  CO2 19 - 32 mEq/L _0 Calcium 8.4 - 10.5 mg/dL 9.6 9.4 9.3  Total Protein 6.5 - 8.1 g/dL - 6.4(L) 6.1(L)  Total Bilirubin 0.3 - 1.2 mg/dL - 0.5 0.7  Alkaline Phos 38 - 126 U/L - 70 61  AST 15 - 41 U/L - 41 51(H)  ALT 0 - 44 U/L - 36 41    Lab Results  Component Value Date   WBC 6.3 03/17/2021   HGB 9.5 (L) 03/17/2021   HCT 32.6 (L) 03/17/2021   MCV 100.9 (H) 03/17/2021   PLT 141 (L) 03/17/2021   NEUTROABS 4.7 03/17/2021    ASSESSMENT & PLAN:  Anemia of chronic disease Patient has had longstanding anemia where the hemoglobin was between 10 to 12 g. Since April 2020 her hemoglobin has gotten worse During the same timeframe her creatinine got from 1.5-2.44    Current treatment: Retacrit injections weekly started 01/09/2019; changed to Aranesp 545m q3 weeks 11/12/2019 Bone marrow biopsy 04/08/2019: Normocellular bone marrow with trilineage hematopoiesis, absent iron stores,  flow cytometry no abnormal B or T-cell clonal abnormalities noted.     IV iron therapy:  Venofer: November 2020, July 2021, June 2022   Lab review: 01/22/2020: Hemoglobin 10.3 (previously transfuse for hemoglobin of 7.5) 02/04/2020: Hemoglobin 11.4 (did not receive Aranesp injection) 03/14/2020: Hemoglobin 10.5, platelets 108 (did not receive Aranesp injection) 05/13/2020: Hemoglobin 8.9: Receiving Aranesp today 08/19/20: Hb 9.5 : received Aranesp 01/20/2021: Hemoglobin 8.4 (restarted Aranesp) 02/17/2021: Hemoglobin 9.5 03/17/2021: Hemoglobin 9.5   Continue with Aranesp treatments Peripheral neuropathy: I sent a prescription for Cymbalta 20 mg today.  I will request physical therapy to see her for strengthening as well as neuropathy care.  Recheck labs in monthly. Follow-up monthly for lab and injection and 2 months for follow-up with me.    No orders of the defined types were placed in this encounter.  The patient has a good understanding of the overall plan. she agrees with it. she will call with any problems that may develop before the next visit here.  Total time spent: 20 mins including face to face time and time spent for planning, charting and coordination of care  VRulon Eisenmenger MD, MPH 03/17/2021  I, KThana Ates am acting as scribe for Dr. VNicholas Lose  I have reviewed the above documentation for accuracy and completeness, and I agree with the above.

## 2021-03-17 ENCOUNTER — Inpatient Hospital Stay: Payer: Medicare Other

## 2021-03-17 ENCOUNTER — Other Ambulatory Visit: Payer: Self-pay | Admitting: *Deleted

## 2021-03-17 ENCOUNTER — Inpatient Hospital Stay: Payer: Medicare Other | Attending: Adult Health

## 2021-03-17 ENCOUNTER — Other Ambulatory Visit: Payer: Self-pay

## 2021-03-17 ENCOUNTER — Inpatient Hospital Stay (HOSPITAL_BASED_OUTPATIENT_CLINIC_OR_DEPARTMENT_OTHER): Payer: Medicare Other | Admitting: Hematology and Oncology

## 2021-03-17 DIAGNOSIS — D638 Anemia in other chronic diseases classified elsewhere: Secondary | ICD-10-CM

## 2021-03-17 DIAGNOSIS — C50211 Malignant neoplasm of upper-inner quadrant of right female breast: Secondary | ICD-10-CM | POA: Insufficient documentation

## 2021-03-17 DIAGNOSIS — D631 Anemia in chronic kidney disease: Secondary | ICD-10-CM | POA: Diagnosis not present

## 2021-03-17 DIAGNOSIS — Z79899 Other long term (current) drug therapy: Secondary | ICD-10-CM | POA: Diagnosis not present

## 2021-03-17 DIAGNOSIS — Z794 Long term (current) use of insulin: Secondary | ICD-10-CM | POA: Diagnosis not present

## 2021-03-17 DIAGNOSIS — Z7901 Long term (current) use of anticoagulants: Secondary | ICD-10-CM | POA: Insufficient documentation

## 2021-03-17 DIAGNOSIS — I129 Hypertensive chronic kidney disease with stage 1 through stage 4 chronic kidney disease, or unspecified chronic kidney disease: Secondary | ICD-10-CM

## 2021-03-17 DIAGNOSIS — Z923 Personal history of irradiation: Secondary | ICD-10-CM | POA: Insufficient documentation

## 2021-03-17 DIAGNOSIS — G629 Polyneuropathy, unspecified: Secondary | ICD-10-CM | POA: Diagnosis not present

## 2021-03-17 DIAGNOSIS — Z7951 Long term (current) use of inhaled steroids: Secondary | ICD-10-CM | POA: Diagnosis not present

## 2021-03-17 DIAGNOSIS — Z170 Estrogen receptor positive status [ER+]: Secondary | ICD-10-CM | POA: Insufficient documentation

## 2021-03-17 DIAGNOSIS — N189 Chronic kidney disease, unspecified: Secondary | ICD-10-CM | POA: Diagnosis not present

## 2021-03-17 DIAGNOSIS — Z9221 Personal history of antineoplastic chemotherapy: Secondary | ICD-10-CM | POA: Diagnosis not present

## 2021-03-17 LAB — CBC WITH DIFFERENTIAL (CANCER CENTER ONLY)
Abs Immature Granulocytes: 0.01 10*3/uL (ref 0.00–0.07)
Basophils Absolute: 0.1 10*3/uL (ref 0.0–0.1)
Basophils Relative: 1 %
Eosinophils Absolute: 0.3 10*3/uL (ref 0.0–0.5)
Eosinophils Relative: 5 %
HCT: 32.6 % — ABNORMAL LOW (ref 36.0–46.0)
Hemoglobin: 9.5 g/dL — ABNORMAL LOW (ref 12.0–15.0)
Immature Granulocytes: 0 %
Lymphocytes Relative: 11 %
Lymphs Abs: 0.7 10*3/uL (ref 0.7–4.0)
MCH: 29.4 pg (ref 26.0–34.0)
MCHC: 29.1 g/dL — ABNORMAL LOW (ref 30.0–36.0)
MCV: 100.9 fL — ABNORMAL HIGH (ref 80.0–100.0)
Monocytes Absolute: 0.5 10*3/uL (ref 0.1–1.0)
Monocytes Relative: 8 %
Neutro Abs: 4.7 10*3/uL (ref 1.7–7.7)
Neutrophils Relative %: 75 %
Platelet Count: 141 10*3/uL — ABNORMAL LOW (ref 150–400)
RBC: 3.23 MIL/uL — ABNORMAL LOW (ref 3.87–5.11)
RDW: 13.7 % (ref 11.5–15.5)
WBC Count: 6.3 10*3/uL (ref 4.0–10.5)
nRBC: 0 % (ref 0.0–0.2)

## 2021-03-17 MED ORDER — DULOXETINE HCL 20 MG PO CPEP: 20.0000 mg | ORAL_CAPSULE | Freq: Every day | ORAL | 3 refills | Status: AC

## 2021-03-17 MED ORDER — DARBEPOETIN ALFA 500 MCG/ML IJ SOSY
500.0000 ug | PREFILLED_SYRINGE | Freq: Once | INTRAMUSCULAR | Status: AC
  Administered 2021-03-17: 500 ug via SUBCUTANEOUS
  Filled 2021-03-17: qty 1

## 2021-03-17 NOTE — Progress Notes (Signed)
Per MD request RN placed referral to cancer outpatient rehab for balance issues related to neuropathy.  Orders placed.

## 2021-03-17 NOTE — Assessment & Plan Note (Signed)
Patient has had longstanding anemia where the hemoglobin was between 10 to 12 g. Since April 2020 her hemoglobin has gotten worse During the same timeframe her creatinine got from 1.5-2.44   Current treatment:Retacritinjectionsweeklystarted7/31/2020; changed to Aranesp 564m q3 weeks 11/12/2019 Bone marrow biopsy 04/08/2019: Normocellular bone marrow with trilineage hematopoiesis, absent iron stores, flow cytometry no abnormal B or T-cell clonal abnormalities noted.   IV iron therapy:Venofer: November 2020, July 2021, June 2022  Lab review: 01/22/2020: Hemoglobin 10.3 (previously transfuse for hemoglobin of 7.5) 02/04/2020:Hemoglobin 11.4(did not receive Aranesp injection) 03/14/2020: Hemoglobin 10.5, platelets 108 (did not receive Aranesp injection) 05/13/2020: Hemoglobin 8.9: Receiving Aranesp today 08/19/20: Hb 9.5 : received Aranesp 01/20/2021: Hemoglobin 8.4 (restarted Aranesp) 02/17/2021: Hemoglobin 9.5  Continue with Aranesp treatments  Recheck labs in monthly. Follow-up monthly for lab and injection and 2 months for follow-up with me.

## 2021-03-18 IMAGING — DX DG CHEST 2V
2 series · 2 of 2 positions shown · non-contrast
Comparison: 06/26/2019

CLINICAL DATA: Follow-up pleural effusion.

EXAM:
CHEST - 2 VIEW

[dg chest 2 view (1 of 2)]
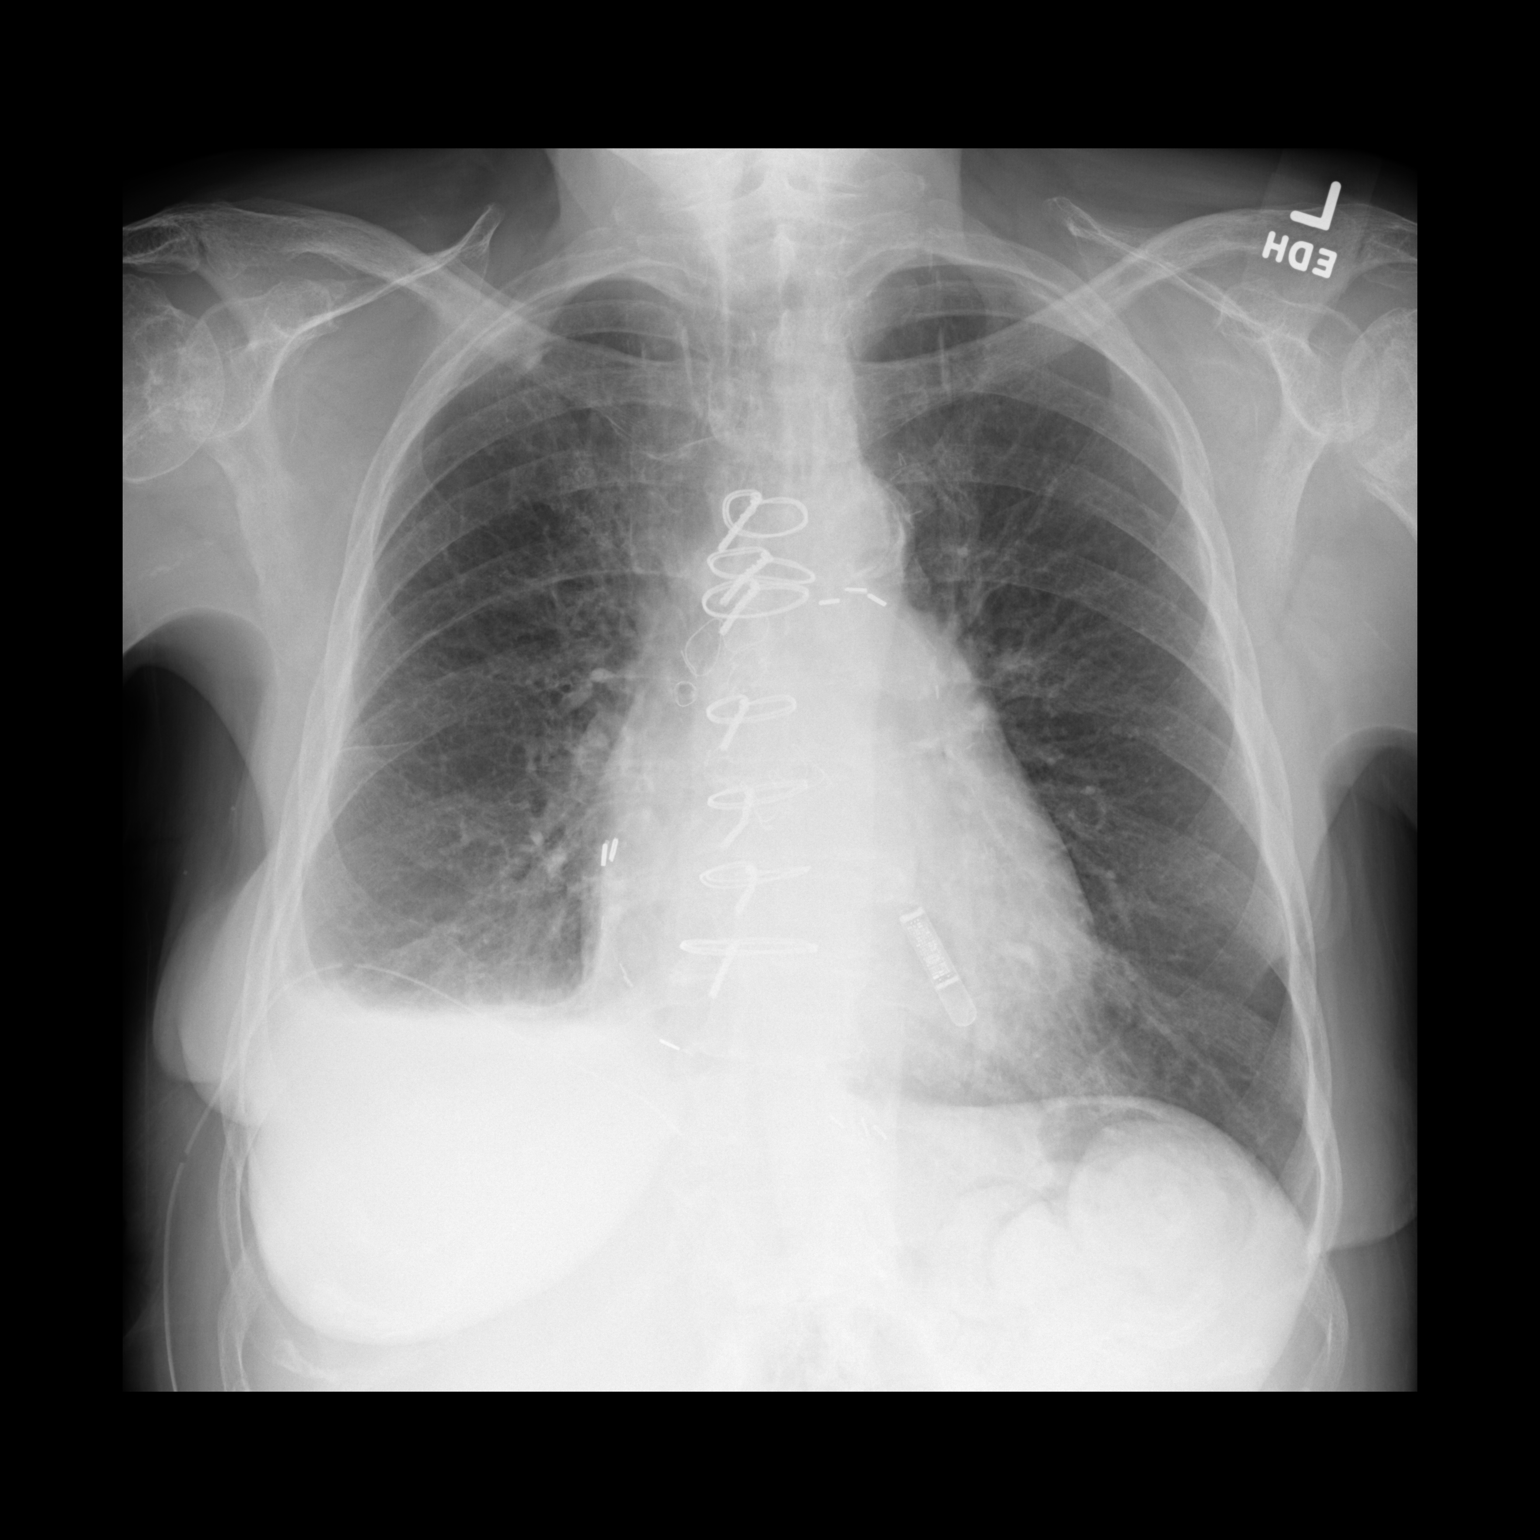

[dg chest 2 view (2 of 2)]
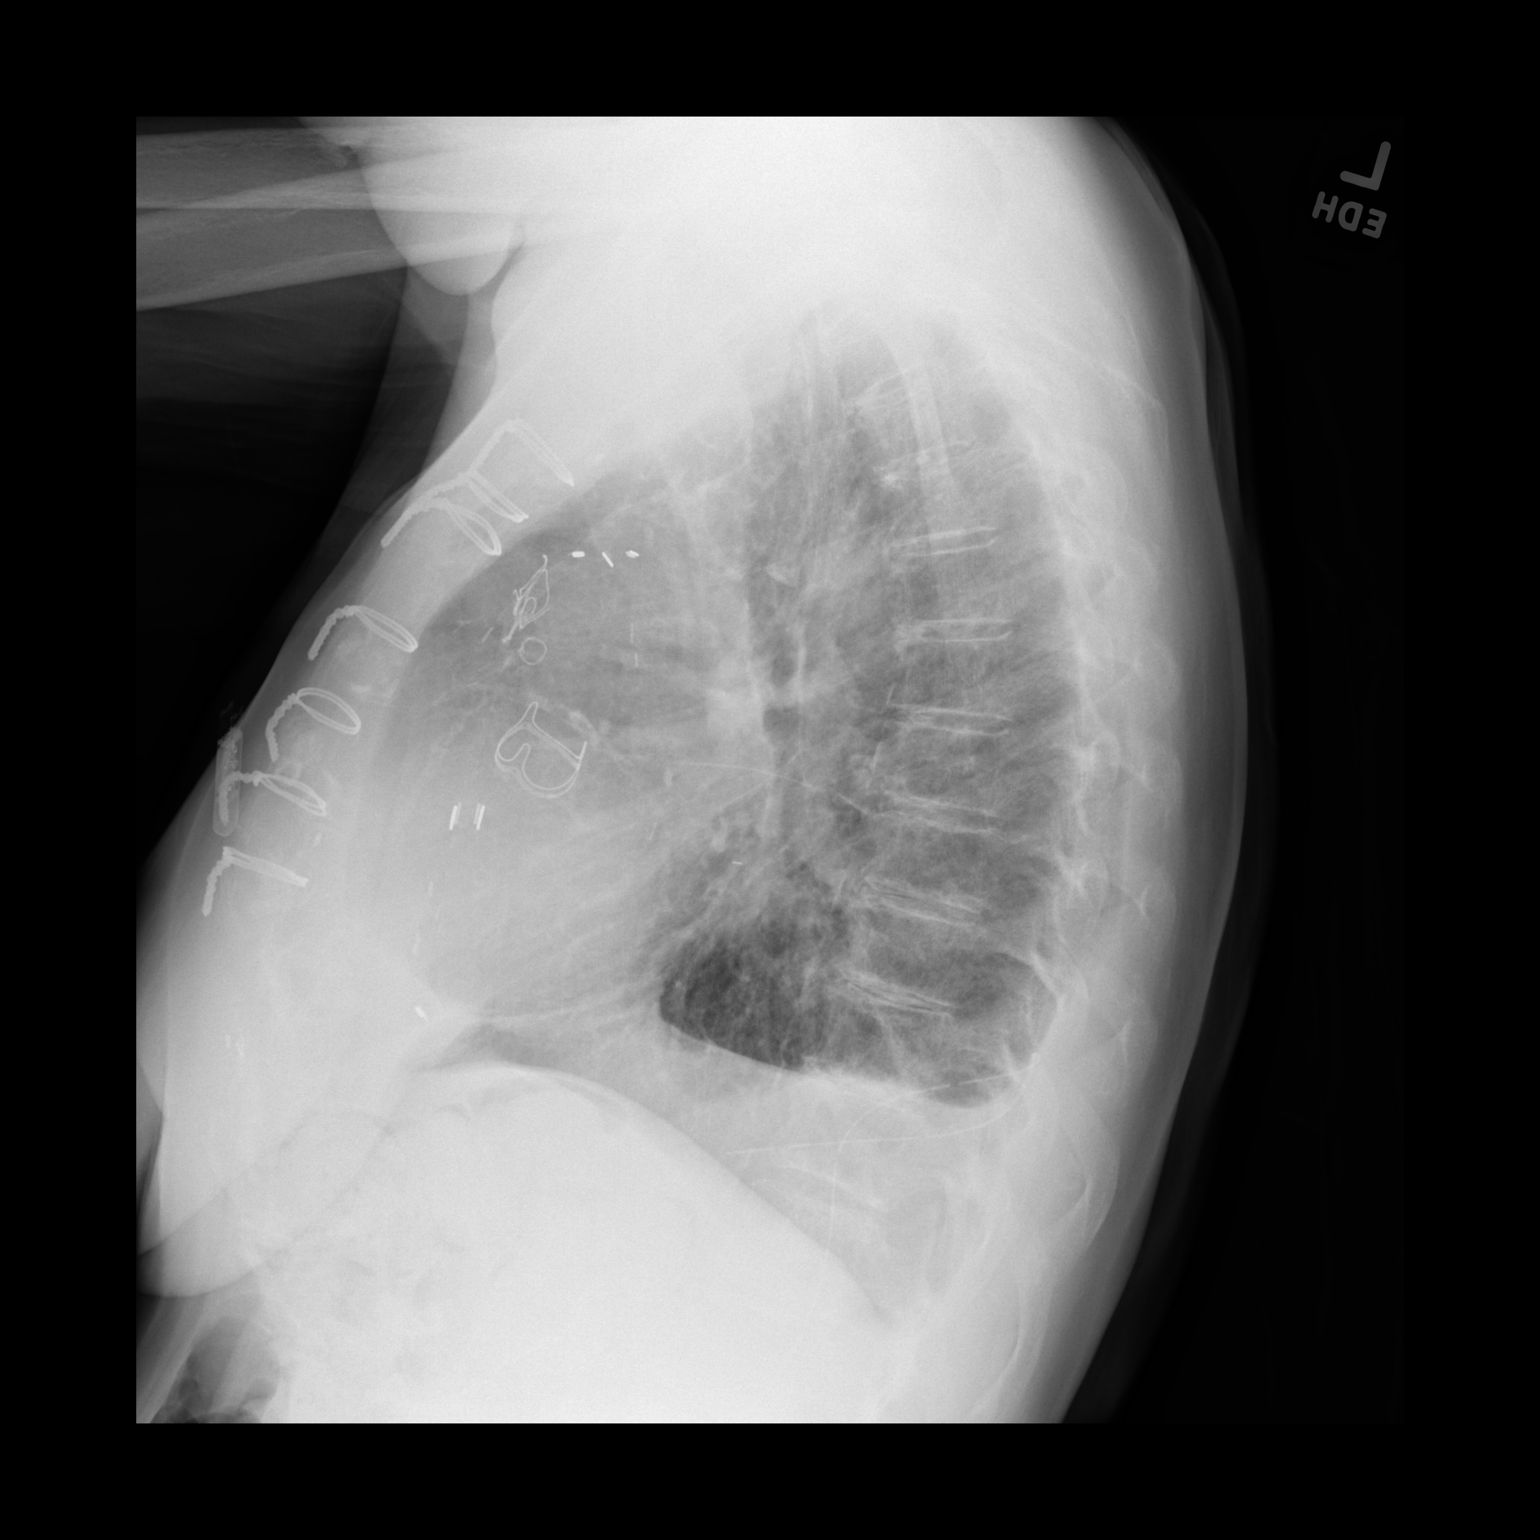

[2 of 2 positions shown; findings below may reference images not displayed]

FINDINGS: The heart is normal in size and stable. Stable surgical changes from
bypass surgery and aortic valve replacement surgery. A loop recorder
is noted on the left side. Mild tortuosity and calcification of the
thoracic aorta again demonstrated.

Right-sided PleurX drainage catheter remains in good position. There
is a persistent small right pleural effusion, slightly larger when
compared to the prior chest film. The left lung remains clear. No
left-sided pleural effusion.
IMPRESSION: PleurX drainage catheter remains in good position. Persistent small
right pleural effusion, slightly larger when compared to the prior
chest film.

## 2021-03-22 ENCOUNTER — Other Ambulatory Visit (HOSPITAL_COMMUNITY): Payer: Self-pay

## 2021-03-22 MED ORDER — ROSUVASTATIN CALCIUM 40 MG PO TABS: 40.0000 mg | ORAL_TABLET | Freq: Every day | ORAL | 0 refills | Status: AC

## 2021-03-22 NOTE — Progress Notes (Addendum)
Patient ID: Hannah [name], female   DOB: 1946/01/09, 75 y.o.   MRN: [id number] PCP: [name], DO Heme/Onc: Dr. Lindi Adie HF Cardiology: Dr. Aundra Dubin  75 y.o. with history of CAD s/p CABG, bioprosthetic AVR, CKD, paroxysmal atrial fibrillation presents for cardiology followup.  She had her initial CABG in 1998, then had redo CABG in 2009 with bioprosthetic AVR.    In 9/14, she developed tachypalpitations and later chest pain.  In the ER, SBP was in the 200s with elevated HR. Troponin was mildly elevated.  Therefore, she had a cardiac cath showing patent grafts.  She was thought to have had demand ischemia in the setting of hypertensive emergency + tachycardia.  After discharge, she continued to have runs of tachypalpitations.  A 3 week event monitor was put on her.  This showed runs, sometimes long, of atrial tachycardia. She was started on Toprol XL when these runs were noted.  No further palpitations (complete resolution of symptoms).    In 1/15, she was admitted to [facility] with atypical chest pain.  She was ruled out for MI and Lexiscan Cardiolite showed no ischemia.  She was started on Imdur and sent home.  After discharge, she developed a daily headache and nausea.  She did not eat or drink much due to the nausea. Shortly after this, she was standing in her grandson's pediatrician's office and became severely lightheaded.  She had to sit down.  She came to the ER and was found to be orthostatic.  She was given IV fluid and got better quickly.  I had her stop Imdur and symptoms seem to have completely resolved.    Event monitor in 8/16 showed atrial tachycardia versus atypical atrial flutter.  Toprol XL was increased.  She saw Dr Lovena Le who thought that she had short runs of atrial tachycardia and not atrial flutter.  Subsequently, she has been found to have paroxysmal atrial fibrillation and is on Eliquis.     Last echo in 11/21 showed EF 55-60%, mild LVH, normal RV, normal bioprosthetic aortic  valve.  Cardiolite in 11/21 showed no ischemia/infarction.   She has problems with anemia and has had multiple transfusions.  However, it has been a number of months since she required transfusion. She is getting Aranesp.   She has had significant problems with orthostatic intolerance and syncope.  She is taking midodrine now.  Last syncopal episode was in 12/21.  She had a loop recorder placed which has recently shown HR to 30s at times when she has symptomatic lightheadedness.  Biotronik PPM placed.   PYP scan in 3/22 was not suggestive of TTR amyloidosis.  Echo in 4/22 showed EF 60-65%, mild LVH, s/p TAVR with mean gradient 11 mmHg.   She returned for followup of diastolic CHF, paroxysmal atrial fibrillation 6/22.  She was doing better since getting her PPM. She tires easily. Occasional atypical chest pain. Midodrine was stopped.  Today she returns for an acute visit with complaints of weakness. Has been on-going for about a month, no obvious increase in shortness of breath. She walks with her rolling walker. Denies abnormal bleeding, dizziness, edema, or PND/Orthopnea. Appetite ok. No fever, chills or sick contacts. Weight at home 138 pounds. Taking all medications. Takes lasix about 2x/week. Occasional atypical chest pain. Blood sugars controlled. She is not sleeping well, says her mind is racing.  ECG (personally reviewed): a-paced, PR 296 ms  Labs (2/13): BNP 80, K 5.3, creatinine 1.1 Labs (6/13): LDL 76, HDL 41 Labs (  8/13): K 4.3, creatinine 1.38 Labs (9/13): creatinine 1.6, proBNP 128 Labs (9/14): K 4.7, creatinine 1.5, LDL 82, HDL 35 Labs (10/14): K 4.8, creatinine 1.5 Labs (1/15): LDL 105, HDL 37 Labs (2/15): K 4.1, creatinine 1.28, HCT 34 Labs (4/15): K 4.2, creatinine 1.5 => 1.4, BNP 95 Labs (1/16): K 3.8, creatinine 1.6, Hgb 10.5 Labs (7/16): K 3.7, creatinine 1.77, LDL 49, HDL 27 Labs (1/17): LDL 58, HDL 38, HCT 35.4 Labs (4/17): K 4.5, creatinine 1.47 Labs (1/22): K 4.3,  creatinine 2.03, hgb 9.7, BNP 318 Labs (2/22): K 4.8, creatinine 2.83, hgb 10.4, AST 84, ALT 117 Labs (5/22): K 4.5, creatinine 2.4  PMH: 1. Diabetes mellitus 2. HTN 3. Obesity 4. Atrial arrhythmias: Atrial tachycardia and paroxysmal atrial fibrillation.    5. CAD: CABG 1998 with LIMA-LAD/diagonal, sequential SVG-OM1/OM2, SVG-RCA.  Redo CABG 2009 with SVG-OM3, SVG-PDA. Lexiscan myoview (9/13): EF 61%, no ischemia or infarction.  LHC (10/14) with all grafts patent except the old SVG-RCA.  There was 90% stenosis in the AV LCx before ungrafted MOM, but this was unchanged from prior study.  Lexiscan Cardiolite (1/15) with no ischemia. She does not tolerate Imdur.  - Cardiolite (11/21): EF 61%, no ischemia/infarction.  6. CKD 7. Bioprosthetic aortic valve: Echo (2011) with EF 50-55%, bioprosthetic aortic valve well-seated.  Echo (2/13) with EF 60-65%, bioprosthetic aortic valve looked ok.  Echo (9/14): EF 50-55%, mild LVH, bioprosthetic AVR with mean gradient 10, mild MR, moderate TR.  - Echo in 11/21 with stable bioprosthetic aortic valve.  8. Asthma: since her teens. 9. Low back pain.  10. 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  Per neurologist, did not appear cardioembolic.   11. Carotid stenosis: 40-59% bilateral ICA stenosis on 10/14 carotid dopplers. Carotid doppler (11/15) with 40-59% bilateral ICA stenosis.  Carotid dopplers (11/16) with 40-59% BICA stenosis.  - Carotid dopplers (8/21): 1-39% BICA stenosis.  12. Sleep study 12/14 without significant OSA.  13. Chronic diastolic CHF - Echo (77/41): EF 55-60%, mild LVH, RV normal, bioprosthetic aortic valve with mean gradient 8 mmHg and no AI.  - PYP scan (3/22): Negative, H/CL 1.1, grade 1.  - Echo (4/22): EF 60-65%, mild LVH, s/p TAVR with mean gradient 11 mmHg.  14. Breast cancer: Diagnosed 2017.  She had chemotherapy and right lumpectomy.  15. Anemia of renal disease 16. Tachy-brady syndrome: Symptomatic bradycardia.  -  Biotronik PPM.  17. COVID-19 infection 2/21  SH: Never smoked.  Lives in [place].  Married.    FH: No premature CAD.   ROS: All systems reviewed and negative except as per HPI.   Current Outpatient Medications  Medication Sig Dispense Refill   Accu-Chek FastClix Lancets MISC Use to check blood sugar 3 times per day 100 each 5   ACCU-CHEK GUIDE test strip Use to test blood sugar 3 times daily 150 strip 3   acetaminophen (TYLENOL) 500 MG tablet Take 1,000 mg by mouth as needed for moderate pain.     albuterol (PROVENTIL) (2.5 MG/3ML) 0.083% nebulizer solution Take 3 mLs (2.5 mg total) by nebulization every 6 (six) hours as needed for wheezing or shortness of breath. 75 mL 3   albuterol (VENTOLIN HFA) 108 (90 Base) MCG/ACT inhaler Inhale 2 puffs into the lungs every 6 (six) hours as needed for wheezing or shortness of breath. 6.7 g 10   amiodarone (PACERONE) 200 MG tablet TAKE ONE TABLET BY MOUTH DAILY 90 tablet 2   Blood Glucose Monitoring Suppl (ACCU-CHEK GUIDE  ME) w/Device KIT Use accu chek meter to check blood sugar three times daily. 1 kit 0   cetirizine (ZYRTEC) 10 MG tablet Take 10 mg by mouth daily.     Cholecalciferol (DIALYVITE VITAMIN D 5000) 125 MCG (5000 UT) capsule Take 5,000 Units by mouth daily.     Continuous Blood Gluc Receiver (FREESTYLE LIBRE 2 READER) DEVI Use to check blood sugars daily 1 each 0   Continuous Blood Gluc Sensor (FREESTYLE LIBRE 2 SENSOR) MISC Use to check blood sugars 4 times a day. 6 each 2   DULoxetine (CYMBALTA) 20 MG capsule Take 1 capsule (20 mg total) by mouth daily. 30 capsule 3   ELIQUIS 2.5 MG TABS tablet TAKE ONE TABLET BY MOUTH TWICE DAILY 180 tablet 1   exemestane (AROMASIN) 25 MG tablet TAKE ONE TABLET BY MOUTH DAILY AFTER BREAKFAST 90 tablet 3   ezetimibe (ZETIA) 10 MG tablet Take 1 tablet (10 mg total) by mouth daily. 90 tablet 3   famotidine (PEPCID) 20 MG tablet TAKE ONE TABLET BY MOUTH TWICE DAILY. ONE AFTER SUPPER 60 tablet 11    furosemide (LASIX) 20 MG tablet Take 20 mg by mouth daily as needed for fluid.     HUMALOG KWIKPEN 100 UNIT/ML KwikPen INJECT SUBCUTANEOUSLY 4 TO  6 UNITS 3 TIMES DAILY  BEFORE MEALS (Patient taking differently: Inject 6-8 Units into the skin 3 (three) times daily.) 30 mL 3   insulin glargine (LANTUS) 100 UNIT/ML injection Inject 24 Units into the skin daily.     Insulin Pen Needle (BD PEN NEEDLE NANO U/F) 32G X 4 MM MISC USE AS INSTRUCTED TO INJECT INSULIN 4 TIMES DAILY. 360 each 1   metoprolol succinate (TOPROL-XL) 25 MG 24 hr tablet Take 0.5 tablets (12.5 mg total) by mouth daily. 45 tablet 3   nitroGLYCERIN (NITROSTAT) 0.4 MG SL tablet Place 1 tablet (0.4 mg total) under the tongue every 5 (five) minutes as needed for chest pain. 25 tablet 3   ondansetron (ZOFRAN ODT) 4 MG disintegrating tablet Take 1 tablet (4 mg total) by mouth every 8 (eight) hours as needed. 10 tablet 0   rosuvastatin (CRESTOR) 40 MG tablet Take 1 tablet (40 mg total) by mouth at bedtime. 90 tablet 0   SYMBICORT 160-4.5 MCG/ACT inhaler INHALE TWO PUFFS INTO THE LUNGS TWICE DAILY 16.1 g 5   TRULICITY 1.5 WR/6.0AV SOPN INJECT THE CONTENTS OF ONE  PEN SUBCUTANEOUSLY WEEKLY  AS DIRECTED 6 mL 3   vitamin B-12 (CYANOCOBALAMIN) 100 MCG tablet Take 100 mcg by mouth daily.     zolpidem (AMBIEN) 5 MG tablet Take 7.5 mg by mouth at bedtime as needed for sleep.     No current facility-administered medications for this encounter.   BP (!) 110/58   Pulse 78   Wt 62.9 kg (138 lb 9.6 oz)   LMP  (LMP Unknown)   SpO2 100%   BMI 23.79 kg/m   Wt Readings from Last 3 Encounters:  03/23/21 62.9 kg (138 lb 9.6 oz)  03/17/21 64.7 kg (142 lb 9.6 oz)  01/27/21 64.6 kg (142 lb 6.4 oz)   LMP  (LMP Unknown)  General:  NAD. No resp difficulty, walked into clinic with walker, frail HEENT: Normal Neck: Supple. No JVD. Carotids 2+ bilat; no bruits. No lymphadenopathy or thryomegaly appreciated. Cor: PMI nondisplaced. Regular rate & rhythm. No  rubs, gallops, 2/6 SEM RUSB Lungs: Clear Abdomen: Soft, nontender, nondistended. No hepatosplenomegaly. No bruits or masses. Good bowel sounds. Extremities: No cyanosis,  clubbing, rash, edema Neuro: Alert & oriented x 3, cranial nerves grossly intact. Moves all 4 extremities w/o difficulty. Affect pleasant.  Assessment/Plan: 1. Fatigue: Worsening over past month. Likely multi-factorial with CKD, anemia, insomnia and physical deconditioning. She is followed by Dr. Lindi Adie for her anemia and receives Venofer infusions. She is well-compensated from a heart failure standpoint. Her blood sugars are controlled. No OSA.  - Check CMET, CBC, TSH. - ? Amiodarone side effect. - Will need to follow up with PCP. 2. CAD: Admission in 9/14 with mild troponin elevation.  LHC showed stable anatomy with patent grafts except for SVG-RCA from older CABG.  Suspect that the troponin elevation was due to demand ischemia in the setting of hypertensive emergency and tachycardia (possible run of atrial tachycardia).  Admission in 1/15 with atypical chest pain, had Lexiscan Cardiolite that showed no ischemia.  Cardiolite in 11/21 with no ischemia/infarction. She has chronic, stable episodes of atypical chest pain. Given normal Cardiolite and atypical nature of pain, possible microvascular angina. No change to atypical pain pattern. ? Worsening fatigue as angina equivalent, however would not cath due to CKD unless ACS. ECG today without ischemia. - No ASA given Eliquis use.  - Continue Crestor 40 mg daily.   - Continue low-dose Toprol XL 12.5 mg daily.      3. Atrial fibrillation: She is on amiodarone 200 daily and apixaban (dose reduction of apixaban given renal function and weight).  - Check LFTs, TSH today.  She will need a regular eye exam with amiodarone.   4. HYPERLIPIDEMIA: Continue Crestor and Zetia. - Check lipids today. 5. Bioprosthetic aortic valve: Stable on 4/22 echo.   6. Chronic diastolic CHF: Echo in 28/20  with EF 55-60% with mild LVH.  PYP scan was not suggestive of TTR cardiac amyloidosis.  Limited echo (4/22): EF 60-65%, mild LVH. She looks euvolemic today, NYHA class II-III, limited by fatigue.  - Continue to take Lasix prn.  7. CKD: Stage IV.  - BMET today.  8. Syncope/presyncope: Tachy-brady syndrome (atrial fibrillation and sinus bradycardia), also orthostatic hypotension possibly due to autonomic neuropathy.  She now has Biotronik PPM.    Follow up with Dr. Aundra Dubin as scheduled.  [place] 03/23/2021

## 2021-03-23 ENCOUNTER — Ambulatory Visit (HOSPITAL_COMMUNITY)
Admission: RE | Admit: 2021-03-23 | Discharge: 2021-03-23 | Disposition: A | Discharge status: Home / Self Care | Payer: Medicare Other | Source: Ambulatory Visit | Attending: Family Medicine | Admitting: Family Medicine

## 2021-03-23 ENCOUNTER — Encounter (HOSPITAL_COMMUNITY): Payer: Self-pay

## 2021-03-23 ENCOUNTER — Other Ambulatory Visit: Payer: Self-pay

## 2021-03-23 VITALS — BP 110/58 | HR 78 | Wt 138.6 lb

## 2021-03-23 DIAGNOSIS — I130 Hypertensive heart and chronic kidney disease with heart failure and stage 1 through stage 4 chronic kidney disease, or unspecified chronic kidney disease: Secondary | ICD-10-CM | POA: Diagnosis not present

## 2021-03-23 DIAGNOSIS — N184 Chronic kidney disease, stage 4 (severe): Secondary | ICD-10-CM | POA: Diagnosis not present

## 2021-03-23 DIAGNOSIS — E782 Mixed hyperlipidemia: Secondary | ICD-10-CM

## 2021-03-23 DIAGNOSIS — Z8616 Personal history of COVID-19: Secondary | ICD-10-CM | POA: Insufficient documentation

## 2021-03-23 DIAGNOSIS — Z952 Presence of prosthetic heart valve: Secondary | ICD-10-CM

## 2021-03-23 DIAGNOSIS — I5032 Chronic diastolic (congestive) heart failure: Secondary | ICD-10-CM | POA: Insufficient documentation

## 2021-03-23 DIAGNOSIS — Z953 Presence of xenogenic heart valve: Secondary | ICD-10-CM | POA: Diagnosis not present

## 2021-03-23 DIAGNOSIS — I495 Sick sinus syndrome: Secondary | ICD-10-CM | POA: Insufficient documentation

## 2021-03-23 DIAGNOSIS — Z951 Presence of aortocoronary bypass graft: Secondary | ICD-10-CM | POA: Diagnosis not present

## 2021-03-23 DIAGNOSIS — R5383 Other fatigue: Secondary | ICD-10-CM

## 2021-03-23 DIAGNOSIS — Z7951 Long term (current) use of inhaled steroids: Secondary | ICD-10-CM | POA: Diagnosis not present

## 2021-03-23 DIAGNOSIS — I25118 Atherosclerotic heart disease of native coronary artery with other forms of angina pectoris: Secondary | ICD-10-CM | POA: Insufficient documentation

## 2021-03-23 DIAGNOSIS — I951 Orthostatic hypotension: Secondary | ICD-10-CM | POA: Insufficient documentation

## 2021-03-23 DIAGNOSIS — E785 Hyperlipidemia, unspecified: Secondary | ICD-10-CM | POA: Diagnosis not present

## 2021-03-23 DIAGNOSIS — Z794 Long term (current) use of insulin: Secondary | ICD-10-CM | POA: Diagnosis not present

## 2021-03-23 DIAGNOSIS — E1122 Type 2 diabetes mellitus with diabetic chronic kidney disease: Secondary | ICD-10-CM | POA: Insufficient documentation

## 2021-03-23 DIAGNOSIS — Z79899 Other long term (current) drug therapy: Secondary | ICD-10-CM | POA: Insufficient documentation

## 2021-03-23 DIAGNOSIS — Z7901 Long term (current) use of anticoagulants: Secondary | ICD-10-CM | POA: Diagnosis not present

## 2021-03-23 DIAGNOSIS — I2510 Atherosclerotic heart disease of native coronary artery without angina pectoris: Secondary | ICD-10-CM

## 2021-03-23 DIAGNOSIS — I480 Paroxysmal atrial fibrillation: Secondary | ICD-10-CM | POA: Diagnosis not present

## 2021-03-23 DIAGNOSIS — Z7985 Long-term (current) use of injectable non-insulin antidiabetic drugs: Secondary | ICD-10-CM | POA: Insufficient documentation

## 2021-03-23 LAB — COMPREHENSIVE METABOLIC PANEL
ALT: 35 U/L (ref 0–44)
AST: 45 U/L — ABNORMAL HIGH (ref 15–41)
Albumin: 2.8 g/dL — ABNORMAL LOW (ref 3.5–5.0)
Alkaline Phosphatase: 57 U/L (ref 38–126)
Anion gap: 6 (ref 5–15)
BUN: 29 mg/dL — ABNORMAL HIGH (ref 8–23)
CO2: 27 mmol/L (ref 22–32)
Calcium: 9.3 mg/dL (ref 8.9–10.3)
Chloride: 104 mmol/L (ref 98–111)
Creatinine, Ser: 2.54 mg/dL — ABNORMAL HIGH (ref 0.44–1.00)
GFR, Estimated: 19 mL/min — ABNORMAL LOW (ref >60)
Glucose, Bld: 220 mg/dL — ABNORMAL HIGH (ref 70–99)
Potassium: 5 mmol/L (ref 3.5–5.1)
Sodium: 137 mmol/L (ref 135–145)
Total Bilirubin: 0.7 mg/dL (ref 0.3–1.2)
Total Protein: 5.9 g/dL — ABNORMAL LOW (ref 6.5–8.1)

## 2021-03-23 LAB — TSH: TSH: 4.265 u[IU]/mL (ref 0.350–4.500)

## 2021-03-23 LAB — LIPID PANEL
Cholesterol: 99 mg/dL (ref 0–200)
HDL: 38 mg/dL — ABNORMAL LOW (ref >40)
LDL Cholesterol: 46 mg/dL (ref 0–99)
Total CHOL/HDL Ratio: 2.6 RATIO
Triglycerides: 77 mg/dL (ref <150)
VLDL: 15 mg/dL (ref 0–40)

## 2021-03-23 NOTE — Patient Instructions (Addendum)
EKG done today.  Labs done today. We will contact you only if your labs are abnormal.  No medication changes were made. Please continue all current medications as prescribed.  Your physician recommends that you keep your scheduled appointment with Dr. Aundra Dubin on Thursday November 10th, 2022 st 11:20am [place] [id number]  If you have any questions or concerns before your next appointment please send [place] a message through [place] or call our office at [phone].    TO LEAVE A MESSAGE FOR THE NURSE SELECT OPTION 2, PLEASE LEAVE A MESSAGE INCLUDING: YOUR NAME DATE OF BIRTH CALL BACK NUMBER REASON FOR CALL**this is important as we prioritize the call backs  YOU WILL RECEIVE A CALL BACK THE SAME DAY AS LONG AS YOU CALL BEFORE 4:00 PM   Do the following things EVERYDAY: Weigh yourself in the morning before breakfast. Write it down and keep it in a log. Take your medicines as prescribed Eat low salt foods--Limit salt (sodium) to 2000 mg per day.  Stay as active as you can everyday Limit all fluids for the day to less than 2 liters   At the [facility], you and your health needs are our priority. As part of our continuing mission to provide you with exceptional heart care, we have created designated Provider Care Teams. These Care Teams include your primary Cardiologist (physician) and Advanced Practice Providers (APPs- Physician Assistants and Nurse Practitioners) who all work together to provide you with the care you need, when you need it.   You may see any of the following providers on your designated Care Team at your next follow up: Dr Glori Bickers Dr Haynes Kerns, NP Lyda Jester, [place] Audry Riles, PharmD   Please be sure to bring in all your medications bottles to every appointment.

## 2021-03-23 NOTE — Addendum Note (Signed)
Encounter addended by: Rafael Bihari, FNP on: 03/23/2021 1:40 PM  Actions taken: Clinical Note Signed

## 2021-03-25 IMAGING — CR DG CHEST 2V
2 series · 2 of 2 positions shown · non-contrast
Comparison: 07/01/2019

CLINICAL DATA: Right pleural effusion

EXAM:
CHEST - 2 VIEW

[w chest pa]
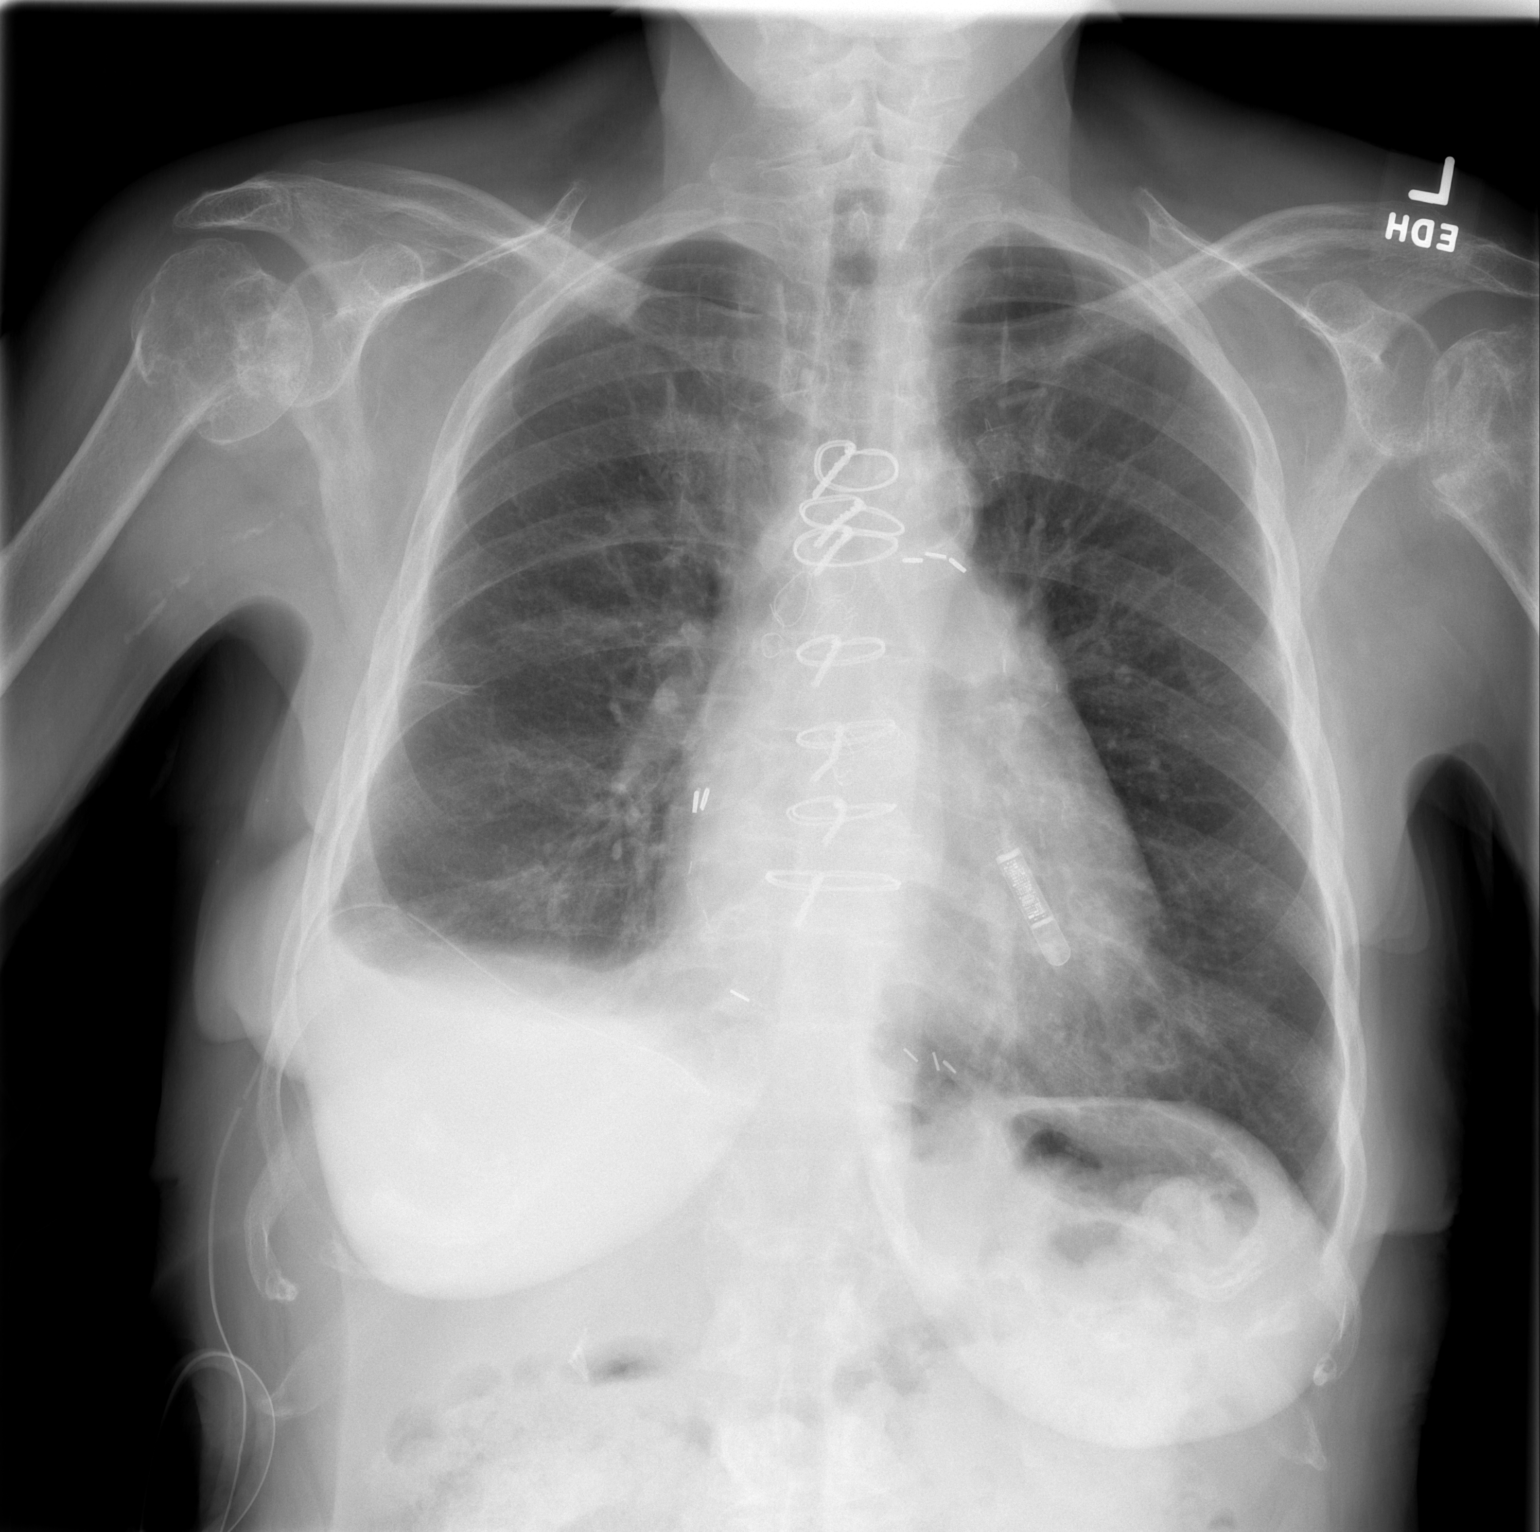

[w chest lat]
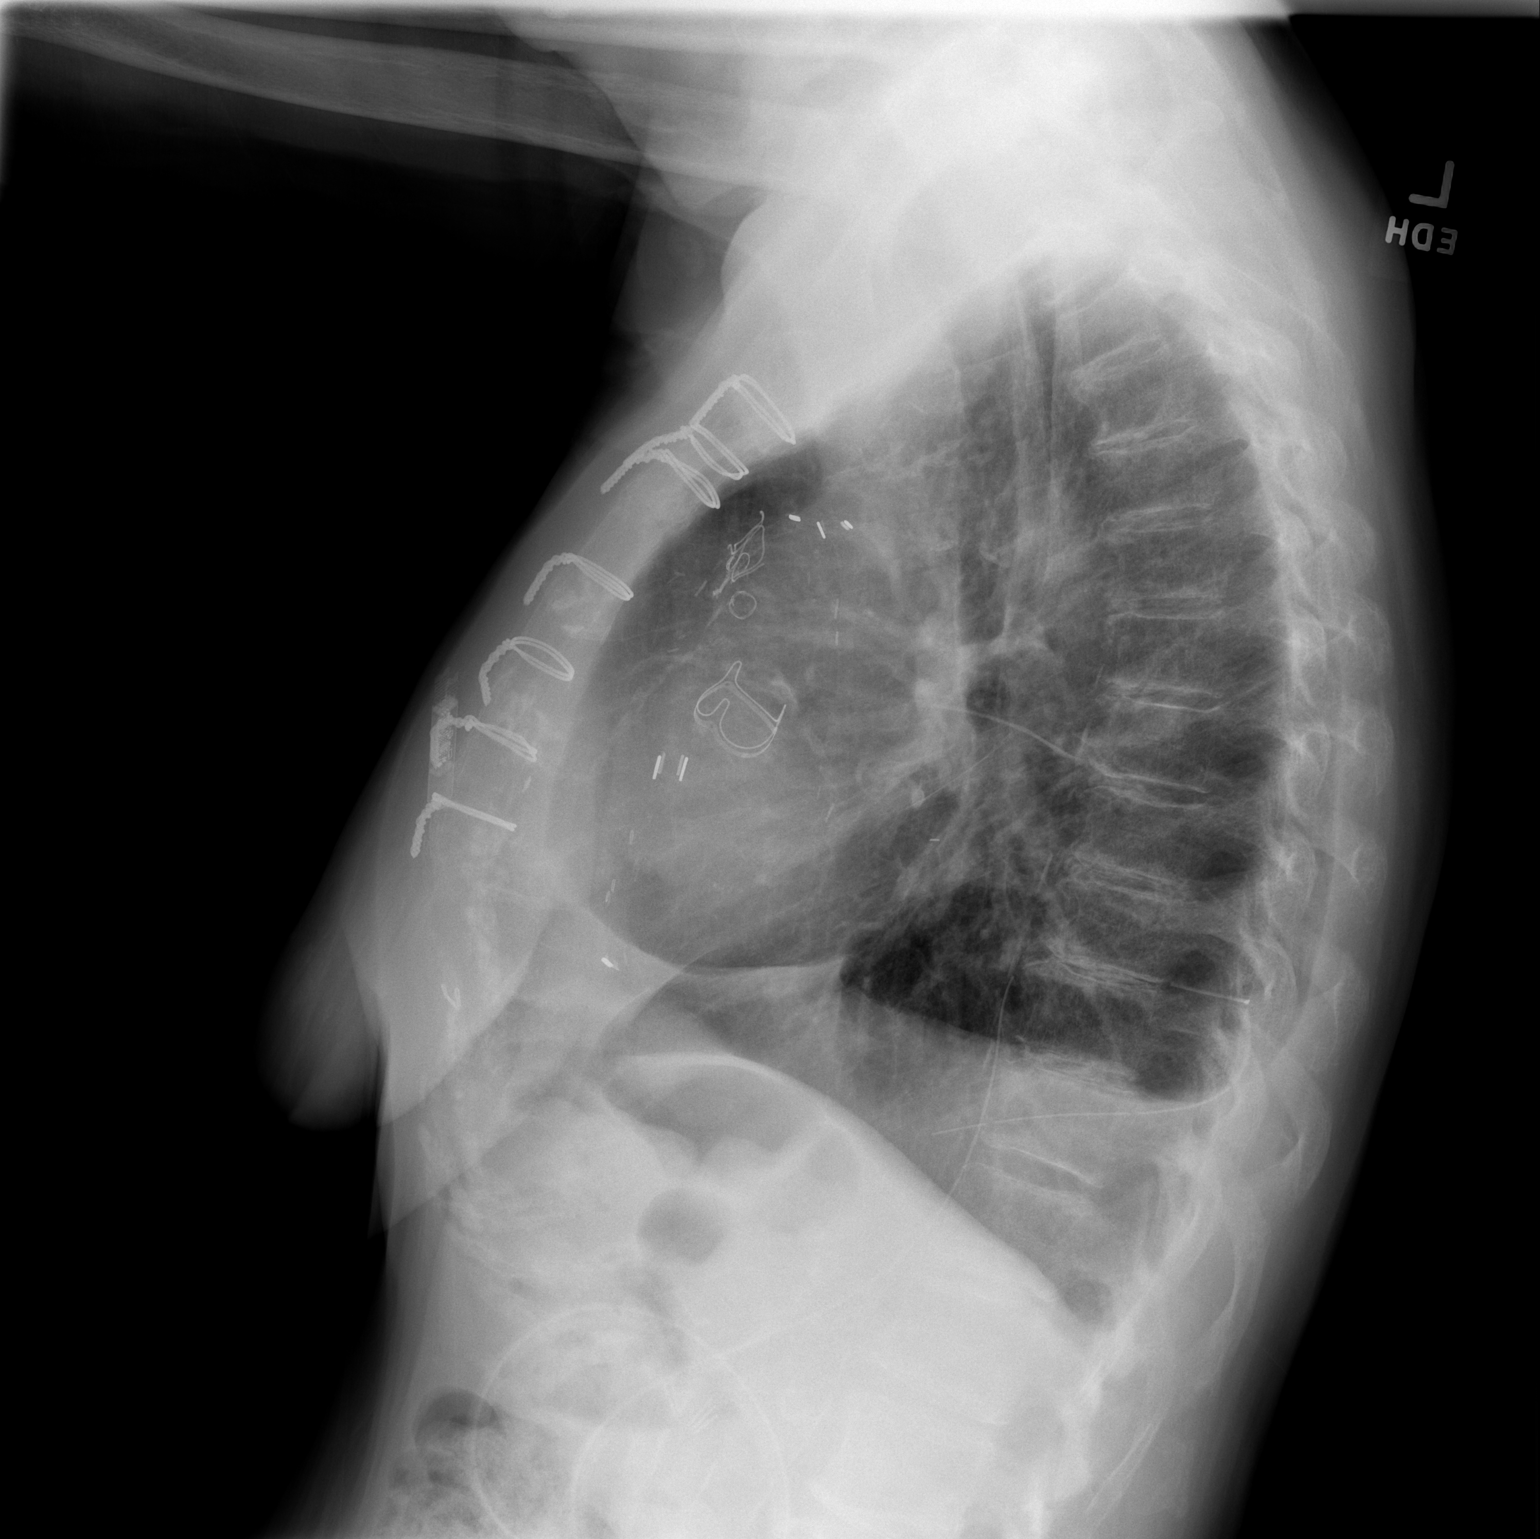

[2 of 2 positions shown; findings below may reference images not displayed]

FINDINGS: Pleural drainage catheter at the right lung base is unchanged.
Similar small right pleural effusion and adjacent atelectasis. No
pneumothorax. Stable cardiomediastinal contours with evidence of
prior cardiac surgery. Loop recorder is again noted.
IMPRESSION: Similar small right pleural effusion and adjacent atelectasis.

## 2021-03-28 ENCOUNTER — Other Ambulatory Visit: Payer: Self-pay | Admitting: Urology

## 2021-03-29 ENCOUNTER — Encounter: Payer: Self-pay | Admitting: Internal Medicine

## 2021-03-29 ENCOUNTER — Other Ambulatory Visit: Payer: Self-pay

## 2021-03-29 ENCOUNTER — Encounter (HOSPITAL_BASED_OUTPATIENT_CLINIC_OR_DEPARTMENT_OTHER): Payer: Self-pay | Admitting: Urology

## 2021-03-29 ENCOUNTER — Telehealth: Payer: Self-pay | Admitting: Internal Medicine

## 2021-03-29 NOTE — Telephone Encounter (Signed)
Patient with diagnosis of A Fib with AVR on Eliquis for anticoagulation.    Procedure: Ureteroscopy and laser lithotripsy   Date of procedure: 04/04/21  CHA2DS2-VASc Score = 9  This indicates a 12.2% annual risk of stroke. The patient's score is based upon: CHF History: 1 HTN History: 1 Diabetes History: 1 Stroke History: 2 (2013) Vascular Disease History: 1 Age Score: 2 Gender Score: 1  CrCl 83ml/min Platelet count 141K  Per office protocol, patient can hold Eliquis for 2 days prior to procedure.

## 2021-03-29 NOTE — Telephone Encounter (Signed)
   Primary Cardiologist: Truitt Merle, NP (Inactive)  Chart reviewed as part of pre-operative protocol coverage. Given past medical history and time since last visit, based on ACC/AHA guidelines, Hannah [name] would be at acceptable risk for the planned procedure without further cardiovascular testing.   Patient with diagnosis of A Fib with AVR on Eliquis for anticoagulation.     Procedure: Ureteroscopy and laser lithotripsy   Date of procedure: 04/04/21   CHA2DS2-VASc Score = 9  This indicates a 12.2% annual risk of stroke. The patient's score is based upon: CHF History: 1 HTN History: 1 Diabetes History: 1 Stroke History: 2 (2013) Vascular Disease History: 1 Age Score: 2 Gender Score: 1   CrCl 62ml/min Platelet count 141K   Per office protocol, patient can hold Eliquis for 2 days prior to procedure.  I will route this recommendation to the requesting party via Epic fax function and remove from pre-op pool.  Please call with questions.  Hannah Ng. Markcus Lazenby NP-C    03/29/2021, 4:40 PM [facility] [address] Office [phone] Fax [phone]

## 2021-03-29 NOTE — Progress Notes (Signed)
[facility] DEVICE PROGRAMMING  Patient Information: Name:  RAEA MAGALLON  DOB:  Jan 04, 1946  MRN:  [id number]    Planned Procedure; CYSTOSCOPY/URETEROSCOPY/HOLMIUM LASER/STONE EXTRACTION AND STENT PLACEMENT RIGHT  Surgeon: Harold Barban  Date of Procedure:  04-04-2021  Cautery will be used.  Position during surgery:   Please send documentation back to:  [place] (Fax # [phone])  Device Information:  Clinic EP Physician:  [name], MD   Device Type:  Pacemaker Manufacturer and Phone #:  Biotronik: [phone] Pacemaker Dependent?:  No. Date of Last Device Check:  11/15/20 Normal Device Function?:  Yes.    Electrophysiologist's Recommendations:  Have magnet available. Provide continuous ECG monitoring when magnet is used or reprogramming is to be performed.  Procedure may interfere with device function.  Magnet should be placed over device during procedure.  Per Device Clinic Standing Orders, Simone Curia, RN  9:35 AM 03/29/2021

## 2021-03-29 NOTE — Telephone Encounter (Signed)
   [facility] Pre-operative Risk Assessment    Patient Name: Hannah [name]  DOB: [birth year] MRN: [id number]  HEARTCARE STAFF:  - IMPORTANT!!!!!! Under Visit Info/Reason for Call, type in Other and utilize the format Clearance MM/DD/YY or Clearance TBD. Do not use dashes or single digits. - Please review there is not already an duplicate clearance open for this procedure. - If request is for dental extraction, please clarify the # of teeth to be extracted. - If the patient is currently at the dentist's office, call Pre-Op Callback Staff (MA/nurse) to input urgent request.  - If the patient is not currently in the dentist office, please route to the Pre-Op pool.  Request for surgical clearance:  What type of surgery is being performed? Ureteroscopy and laser lithotripsy  When is this surgery scheduled? 04/04/21  What type of clearance is required (medical clearance vs. Pharmacy clearance to hold med vs. Both)? Both  Are there any medications that need to be held prior to surgery and how long? Eliquis, 48 Hours prior to surgery  Practice name and name of physician performing surgery? [facility], Dr. Milford Cage  What is the office phone number? [phone] ext [phone]   7.   What is the office fax number? [phone]  8.   Anesthesia type (None, local, MAC, general) ? general   Ermelinda Das 03/29/2021, 8:32 AM  _________________________________________________________________   (provider comments below)

## 2021-03-29 NOTE — Progress Notes (Addendum)
Spoke w/ via phone for pre-op interview---PT Lab needs dos----  I stat             Lab results------see below COVID test -----patient states asymptomatic no test needed Arrive at -------1230 pm 04-04-2021 NPO after MN NO Solid Food.  Clear liquids from MN until---1130 am Med rec completed Medications to take morning of surgery -----amiodarone, pepcid, metorpolol succinate, symbicort, albuterol nebulizer, albuterol inhaler prn and bring inhaler Diabetic medication -----none day of surgery Patient instructed no nail polish to be worn day of surgery Patient instructed to bring photo id and insurance card day of surgery Patient aware to have Driver (ride ) / caregiver    for 24 hours after surgery spouse [name] cell [phone] Patient Special Instructions -----none Pre-Op special Istructions -----none Patient verbalized understanding of instructions that were given at this phone interview. Patient denies shortness of breath, chest pain, fever, cough at this phone interview.   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  Cardiac clearance note jesse cleaver np 03-29-2021 on chart/epic  for 04-04-2021 surgery Device orders dr taylor on chart/epic for 04-04-2021 surgery  PCP:dr [facility] Cardiologist; electrophysiologist dr gregg taylor lov 11-15-2020 epic fu in 1 year Chf cardiologist dr dalton Camila Li [place] [facility]np 03-23-2021 epic Chest x-ray :08-10-2020 epic EKG :03-23-2021 chart/epic Echo :09-15-2020 epic Last device check 11-15-2020 Stress test:none Cardiac Cath : 03-23-2008 Oncology lov dr  Lindi Adie 03-17-2021 (anemia) epic [facility] endocrinology dr Dwyane Dee 01-27-2021 epic [facility]  pulmonary Geraldo Pitter np 07-15-2020 epic (sees dr byrum) Activity level: walks with rolling walker for distances does own house work Sleep Study/ CPAP : sleep study 05-2013 no significant osa Fasting Blood Sugar :      / Checks Blood Sugar -- times a day:  uses free style libra due to be changed on dos, will not have free style libre on [place] Blood Thinner/ Instructions /Last Dose:note to stop eliquis 48 hours before surgery jesse cleaver np 03-29-2021  note on chart/epic (last dose to be 04-01-2021 pt aware)  [facility] neurology dr Raliegh Ip. Delice Lesch 05-03-2020 epic  mri brain 06-01-2020 epic [facility] nephrology 01-27-2021 dr patel on chart  Addendum: pt called to review insulin instructions

## 2021-04-03 NOTE — H&P (Signed)
75 year old female with a history of nephrolithiasis and recurrent urinary tract infections who presents today with 24 hours of right-sided flank pain. The pain is in the right flank and radiates over across the abdomen and down into the pelvis. She denies any fevers or chills. She denies any dysuria or gross hematuria. She is having lots of nausea. She has not been seen in the emergency department or by primary care doctor prior to this episode. Her last CT scan was in September 2021.   The patient has a past medical history significant for A. fib, CHF, and to bypass surgeries. She takes Eliquis and amiodarone among other cardiac medications. She is also an insulin-dependent diabetic.     ALLERGIES: Bactrim TABS - nausea Imdur Tetracycline HCl CAPS    MEDICATIONS: Albuterol Sulfate 2.5 mg/3 ml (0.083 %) vial, nebulizer Inhalation  Ambien  Amiodarone Hcl 200 mg tablet  Cetirizine Hcl 10 mg tablet  Crestor 40 mg tablet Oral  Duloxetine Hcl 20 mg capsule,delayed release  Eliquis 2.5 mg tablet  Exemestane 25 mg tablet tablet  Famotidine 20 mg tablet  Furosemide 20 mg tablet Oral PRN  Humalog Mix 75-25 100 unit/ml (75-25) insulin pen Subcutaneous  Insulin Glargine-Yfgn 100 unit/ml vial  Metoprolol Succinate 25 mg tablet, extended release 24 hr Oral  Nitroglycerin  Symbicort  Trulicity  Tylenol  Ventolin 90 mcg aerosol Inhalation PRN  Vitamin B12  Vitamin D3  Zetia 10 mg tablet Oral  Zofran  Zolpidem Tartrate 5 mg tablet     GU PSH: Cysto Remove Stent FB Sim - 2021 Cysto Uretero Lithotripsy - 2016 Cystoscopy Insert Stent - 2016 Ureteroscopic laser litho, Left - 2021       PSH Notes: Cystoscopy With Ureteroscopy With Lithotripsy, Cystoscopy With Insertion Of Ureteral Stent Right, Heart Surgery, Neuroplasty Median Nerve At Carpal Tunnel, Aortic Valve Repair  Placed a material in her back to help with the support :" Cement" in L1   NON-GU PSH: Carpal Tunnel Surgery.. -  2016 Lumbar Spine Fusion Remove Gallbladder - 2020     GU PMH: Renal calculus - 09/16/2020, - 05/20/2020, - 03/03/2020, - 02/19/2020, - 11/04/2019, - 2021, - 2021, - 2020, - 2019, - 2018, Nephrolithiasis, - 2017 Personal Hx Urinary Tract Infections - 06/17/2020 Acute Cystitis/UTI - 05/20/2020, - 03/03/2020, - 02/19/2020, - 2019 Flank Pain - 02/19/2020 Gross hematuria - 2021 Nocturia - 2020, - 2019 Hypocitraturia, Uric acid crystalluria - 2017 Ureteral calculus, Ureteral stone - 2016    NON-GU PMH: Bacteriuria - 09/16/2020 Pyuria/other UA findings (Stable) - 06/17/2020, - 05/20/2020, - 02/19/2020 Encounter for general adult medical examination without abnormal findings, Encounter for preventive health examination - 2017 Anxiety, Anxiety - 2016 Myocardial Infarction, History of myocardial infarction - 2016 Personal history of other diseases of the circulatory system, History of atrial fibrillation - 2016, History of congestive heart disease, - 2016, History of cardiac murmur, - 2016 Personal history of other diseases of the respiratory system, History of asthma - 2016 Personal history of other endocrine, nutritional and metabolic disease, History of diabetes mellitus - 2016 Personal history of other specified conditions, History of heartburn - 2016 Personal history of transient ischemic attack (TIA), and cerebral infarction without residual deficits, History of stroke - 2016    FAMILY HISTORY: cardiac disorder - Runs In Family Death of family member - Runs In Family Diabetes - Runs In Family renal failure - Runs In Family   SOCIAL HISTORY: Marital Status: Married Preferred Language: English; Ethnicity: Not Hispanic Or Latino;  Race: White Current Smoking Status: Patient has never smoked.   Tobacco Use Assessment Completed: Used Tobacco in last 30 days? Does not use smokeless tobacco. Has never drank.  Does not use drugs. Does not drink caffeine. Has had a blood transfusion.     Notes:  Caffeine use, Number of children, Retired, Never smoked tobacco, Alcohol use, Married   REVIEW OF SYSTEMS:    GU Review Female:   Patient reports get up at night to urinate. Patient denies frequent urination, hard to postpone urination, burning /pain with urination, leakage of urine, stream starts and stops, trouble starting your stream, have to strain to urinate, and being pregnant.  Gastrointestinal (Upper):   Patient reports nausea. Patient denies vomiting and indigestion/ heartburn.  Gastrointestinal (Lower):   Patient denies diarrhea and constipation.  Constitutional:   Patient reports fatigue. Patient denies fever, night sweats, and weight loss.  Skin:   Patient denies skin rash/ lesion and itching.  Eyes:   Patient denies blurred vision and double vision.  Ears/ Nose/ Throat:   Patient denies sore throat and sinus problems.  Hematologic/Lymphatic:   Patient denies swollen glands and easy bruising.  Cardiovascular:   Patient denies leg swelling and chest pains.  Respiratory:   Patient denies cough and shortness of breath.  Endocrine:   Patient denies excessive thirst.  Musculoskeletal:   Patient reports back pain. Patient denies joint pain.  Neurological:   Patient denies headaches and dizziness.  Psychologic:   Patient denies depression and anxiety.   Notes: right side low back, flank, and abdominal pain 7/10    VITAL SIGNS:      03/28/2021 02:56 PM  Weight 138 lb / 62.6 kg  Height 64 in / 162.56 cm  BP 124/76 mmHg  Pulse 69 /min  Temperature 96.9 F / 36.0 C  BMI 23.7 kg/m   GU PHYSICAL EXAMINATION:      Notes: Right CVA tenderness to palpation   MULTI-SYSTEM PHYSICAL EXAMINATION:    Constitutional: Well-nourished. No physical deformities. Normally developed. Good grooming.   Respiratory: Normal breath sounds. No labored breathing, no use of accessory muscles.   Cardiovascular: Regular rate and rhythm. No murmur, no gallop. Normal temperature, normal extremity pulses, no  swelling, no varicosities.      Complexity of Data:  Source Of History:  Patient  Records Review:   Previous Doctor Records, Previous Patient Records  Urine Test Review:   Urinalysis, Urine Culture  X-Ray Review: C.T. Abdomen/Pelvis: Reviewed Films. Discussed With Patient.     PROCEDURES:         C.T. Urogram - [id number]  The patient has a stone at the right UPJ which appears to be a conglomerate of smaller stones.      Patient confirmed No Neulasta OnPro Device.         Urinalysis w/Scope Dipstick Dipstick Cont'd Micro  Color: Yellow Bilirubin: Neg mg/dL WBC/hpf: >60/hpf  Appearance: Cloudy Ketones: Neg mg/dL RBC/hpf: 3 - 10/hpf  Specific Gravity: 1.025 Blood: 2+ ery/uL Bacteria: Many (>50/hpf)  pH: 5.5 Protein: 1+ mg/dL Cystals: NS (Not Seen)  Glucose: 2+ mg/dL Urobilinogen: 0.2 mg/dL Casts: NS (Not Seen)    Nitrites: Neg Trichomonas: Not Present    Leukocyte Esterase: 1+ leu/uL Mucous: Not Present      Epithelial Cells: 10 - 20/hpf      Yeast: NS (Not Seen)      Sperm: Not Present         Ketoralac 30mg  - [id number], [id number] Qty: 30 Adm. By:  Cristobal Goldmann  Unit: mg Lot No [id number]  Route: IM Exp. Date 04/11/2022  Freq: None Mfgr.:   Site: Right Buttock   ASSESSMENT:      ICD-10 Details  1 GU:   Flank Pain - R10.84   2   Renal calculus - N20.0    PLAN:            Medications New Meds: Augmentin 500 mg-125 mg tablet 1 tablet PO BID   #14  0 Refill(s)  Ondansetron Hcl 4 mg tablet 1 tablet PO Q 4 H PRN   #20  1 Refill(s)  Ultram 50 mg tablet 1-2 tablet PO Q 6 H PRN   #30  0 Refill(s)    Stop Meds: Promethazine Hcl 12.5 mg tablet 1 tablet PO Q 8 H PRN  Start: 09/02/2019  Discontinue: 03/28/2021  - Reason: The medication cycle was completed.            Orders Labs Urine Culture  X-Rays: C.T. Stone Protocol Without I.V. Contrast  X-Ray Notes: History:  Hematuria: Yes/No  Patient to see MD after exam: Yes/No  Previous exam: CT / IVP/ US/ KUB/  None  When:  Where:  Diabetic: Yes/ No  BUN/ Creatinine:  Date of last BUN Creatinine:  Weight in pounds:  Allergy- IV Contrast: Yes/ No  Conflicting diabetic meds: Yes/ No  Diabetic Meds:  Prior Authorization #: [id number]            Schedule         Document Letter(s):  Created for Patient: Clinical Summary         Notes:   Patient's flank pain is coming from the stone at the right UPJ. The stone appears to be a matrix stone or soft appearing stone. I discussed the findings with the patient and her husband. We discussed that treatment options. We discussed placing a stent urgently tonight to facilitate better pain control. She is declined this option. We also discussed ureteroscopy in the future. She like to proceed with that. She will need to stop her Eliquis and get cardiac clearance. In the meantime, I have given her strict return precautions and started her empirically on Augmentin based on previous cultures. We will send her urine for culture today. I also given her some pain medication as well as antinausea medicine.

## 2021-04-03 NOTE — Anesthesia Preprocedure Evaluation (Addendum)
Anesthesia Evaluation  Patient identified by MRN, date of birth, ID band Patient awake    Reviewed: Allergy & Precautions, NPO status , Patient's Chart, lab work & pertinent test results, reviewed documented beta blocker date and time   History of Anesthesia Complications (+) PONV and history of anesthetic complications  Airway Mallampati: II  TM Distance: >3 FB Neck ROM: Full    Dental  (+) Missing,    Pulmonary asthma ,    Pulmonary exam normal        Cardiovascular hypertension, Pt. on home beta blockers and Pt. on medications + CAD, + CABG (1998, 2009 ) and +CHF  Normal cardiovascular exam+ dysrhythmias (on Eliquis and amiodarone) Atrial Fibrillation + pacemaker   TTE 09/2020: EF 60-65%, mild LVH, grade II DD, mild MR, s/p AVR   Neuro/Psych Seizures -,  CVA (2013) negative psych ROS   GI/Hepatic Neg liver ROS, GERD  Medicated and Controlled,  Endo/Other  diabetes, Type 2, Insulin Dependent  Renal/GU Renal InsufficiencyRenal disease (Cr 2.54)  negative genitourinary   Musculoskeletal negative musculoskeletal ROS (+)   Abdominal   Peds  Hematology  (+) anemia , Hgb 9.5   Anesthesia Other Findings Day of surgery medications reviewed with patient.  Reproductive/Obstetrics negative OB ROS                            Anesthesia Physical Anesthesia Plan  ASA: 3  Anesthesia Plan: General   Post-op Pain Management:    Induction: Intravenous  PONV Risk Score and Plan: 4 or greater and Treatment may vary due to age or medical condition, Ondansetron, Dexamethasone and Propofol infusion  Airway Management Planned: LMA  Additional Equipment: None  Intra-op Plan:   Post-operative Plan: Extubation in OR  Informed Consent: I have reviewed the patients History and Physical, chart, labs and discussed the procedure including the risks, benefits and alternatives for the proposed anesthesia with  the patient or authorized representative who has indicated his/her understanding and acceptance.     Dental advisory given  Plan Discussed with: CRNA  Anesthesia Plan Comments:        Anesthesia Quick Evaluation

## 2021-04-04 ENCOUNTER — Other Ambulatory Visit: Payer: Self-pay

## 2021-04-04 ENCOUNTER — Ambulatory Visit (HOSPITAL_BASED_OUTPATIENT_CLINIC_OR_DEPARTMENT_OTHER)
Admission: RE | Admit: 2021-04-04 | Discharge: 2021-04-04 | Disposition: A | Discharge status: Home / Self Care | Payer: Medicare Other | Attending: Urology | Admitting: Urology

## 2021-04-04 ENCOUNTER — Encounter (HOSPITAL_BASED_OUTPATIENT_CLINIC_OR_DEPARTMENT_OTHER): Payer: Self-pay | Admitting: Urology

## 2021-04-04 ENCOUNTER — Ambulatory Visit (HOSPITAL_BASED_OUTPATIENT_CLINIC_OR_DEPARTMENT_OTHER): Payer: Medicare Other | Admitting: Anesthesiology

## 2021-04-04 ENCOUNTER — Encounter (HOSPITAL_BASED_OUTPATIENT_CLINIC_OR_DEPARTMENT_OTHER)
Admission: RE | Disposition: A | Discharge status: Home / Self Care | Payer: Self-pay | Source: Home / Self Care | Attending: Urology

## 2021-04-04 DIAGNOSIS — E119 Type 2 diabetes mellitus without complications: Secondary | ICD-10-CM | POA: Insufficient documentation

## 2021-04-04 DIAGNOSIS — Z87440 Personal history of urinary (tract) infections: Secondary | ICD-10-CM | POA: Insufficient documentation

## 2021-04-04 DIAGNOSIS — Z7901 Long term (current) use of anticoagulants: Secondary | ICD-10-CM | POA: Diagnosis not present

## 2021-04-04 DIAGNOSIS — Z7951 Long term (current) use of inhaled steroids: Secondary | ICD-10-CM | POA: Diagnosis not present

## 2021-04-04 DIAGNOSIS — Z951 Presence of aortocoronary bypass graft: Secondary | ICD-10-CM | POA: Diagnosis not present

## 2021-04-04 DIAGNOSIS — Z881 Allergy status to other antibiotic agents status: Secondary | ICD-10-CM | POA: Insufficient documentation

## 2021-04-04 DIAGNOSIS — I4891 Unspecified atrial fibrillation: Secondary | ICD-10-CM | POA: Insufficient documentation

## 2021-04-04 DIAGNOSIS — I509 Heart failure, unspecified: Secondary | ICD-10-CM | POA: Diagnosis not present

## 2021-04-04 DIAGNOSIS — I252 Old myocardial infarction: Secondary | ICD-10-CM | POA: Insufficient documentation

## 2021-04-04 DIAGNOSIS — N132 Hydronephrosis with renal and ureteral calculous obstruction: Secondary | ICD-10-CM | POA: Diagnosis not present

## 2021-04-04 DIAGNOSIS — D649 Anemia, unspecified: Secondary | ICD-10-CM | POA: Insufficient documentation

## 2021-04-04 DIAGNOSIS — Z79899 Other long term (current) drug therapy: Secondary | ICD-10-CM | POA: Insufficient documentation

## 2021-04-04 DIAGNOSIS — Z950 Presence of cardiac pacemaker: Secondary | ICD-10-CM | POA: Insufficient documentation

## 2021-04-04 DIAGNOSIS — Z794 Long term (current) use of insulin: Secondary | ICD-10-CM | POA: Insufficient documentation

## 2021-04-04 HISTORY — PX: CYSTOSCOPY/URETEROSCOPY/HOLMIUM LASER/STENT PLACEMENT: SHX6546

## 2021-04-04 HISTORY — DX: Dependence on other enabling machines and devices: Z99.89

## 2021-04-04 LAB — POCT I-STAT, CHEM 8
BUN: 30 mg/dL — ABNORMAL HIGH (ref 8–23)
Calcium, Ion: 1.02 mmol/L — ABNORMAL LOW (ref 1.15–1.40)
Chloride: 108 mmol/L (ref 98–111)
Creatinine, Ser: 2.4 mg/dL — ABNORMAL HIGH (ref 0.44–1.00)
Glucose, Bld: 166 mg/dL — ABNORMAL HIGH (ref 70–99)
HCT: 29 % — ABNORMAL LOW (ref 36.0–46.0)
Hemoglobin: 9.9 g/dL — ABNORMAL LOW (ref 12.0–15.0)
Potassium: 4.4 mmol/L (ref 3.5–5.1)
Sodium: 138 mmol/L (ref 135–145)
TCO2: 22 mmol/L (ref 22–32)

## 2021-04-04 LAB — GLUCOSE, CAPILLARY: Glucose-Capillary: 167 mg/dL — ABNORMAL HIGH (ref 70–99)

## 2021-04-04 SURGERY — CYSTOSCOPY/URETEROSCOPY/HOLMIUM LASER/STENT PLACEMENT
Anesthesia: General | Site: Pelvis | Laterality: Right

## 2021-04-04 MED ORDER — SODIUM CHLORIDE 0.9 % IV SOLN: INTRAVENOUS | Status: DC

## 2021-04-04 MED ORDER — SODIUM CHLORIDE 0.9 % IR SOLN
Status: DC | PRN
  Administered 2021-04-04: 1500 mL

## 2021-04-04 MED ORDER — IOHEXOL 300 MG/ML  SOLN
INTRAMUSCULAR | Status: DC | PRN
  Administered 2021-04-04: 5 mL via URETHRAL

## 2021-04-04 MED ORDER — PHENYLEPHRINE 40 MCG/ML (10ML) SYRINGE FOR IV PUSH (FOR BLOOD PRESSURE SUPPORT)
PREFILLED_SYRINGE | INTRAVENOUS | Status: DC | PRN
  Administered 2021-04-04: 80 ug via INTRAVENOUS
  Administered 2021-04-04: 120 ug via INTRAVENOUS
  Administered 2021-04-04 (×2): 80 ug via INTRAVENOUS

## 2021-04-04 MED ORDER — FENTANYL CITRATE (PF) 100 MCG/2ML IJ SOLN
INTRAMUSCULAR | Status: AC
  Filled 2021-04-04: qty 2

## 2021-04-04 MED ORDER — OXYCODONE HCL 5 MG PO TABS: 5.0000 mg | ORAL_TABLET | Freq: Once | ORAL | Status: DC | PRN

## 2021-04-04 MED ORDER — CIPROFLOXACIN IN D5W 400 MG/200ML IV SOLN
INTRAVENOUS | Status: DC | PRN
Start: 2021-04-04 — End: 2021-04-04
  Administered 2021-04-04: 400 mg via INTRAVENOUS

## 2021-04-04 MED ORDER — ACETAMINOPHEN 500 MG PO TABS
ORAL_TABLET | ORAL | Status: AC
  Filled 2021-04-04: qty 2

## 2021-04-04 MED ORDER — OXYCODONE HCL 5 MG/5ML PO SOLN: 5.0000 mg | Freq: Once | ORAL | Status: DC | PRN

## 2021-04-04 MED ORDER — FENTANYL CITRATE (PF) 100 MCG/2ML IJ SOLN
INTRAMUSCULAR | Status: DC | PRN
  Administered 2021-04-04: 50 ug via INTRAVENOUS

## 2021-04-04 MED ORDER — APIXABAN 2.5 MG PO TABS: 2.5000 mg | ORAL_TABLET | Freq: Two times a day (BID) | ORAL | 1 refills | Status: AC

## 2021-04-04 MED ORDER — TRAMADOL HCL 50 MG PO TABS
50.0000 mg | ORAL_TABLET | Freq: Once | ORAL | Status: AC
Start: 2021-04-04 — End: 2021-04-04
  Administered 2021-04-04: 50 mg via ORAL

## 2021-04-04 MED ORDER — ONDANSETRON HCL 4 MG/2ML IJ SOLN
INTRAMUSCULAR | Status: DC | PRN
  Administered 2021-04-04: 4 mg via INTRAVENOUS

## 2021-04-04 MED ORDER — LIDOCAINE 2% (20 MG/ML) 5 ML SYRINGE
INTRAMUSCULAR | Status: AC
  Filled 2021-04-04: qty 5

## 2021-04-04 MED ORDER — LIDOCAINE 2% (20 MG/ML) 5 ML SYRINGE
INTRAMUSCULAR | Status: DC | PRN
  Administered 2021-04-04: 60 mg via INTRAVENOUS

## 2021-04-04 MED ORDER — OXYCODONE-ACETAMINOPHEN 5-325 MG PO TABS: 1.0000 | ORAL_TABLET | ORAL | 0 refills | Status: DC | PRN

## 2021-04-04 MED ORDER — FENTANYL CITRATE (PF) 100 MCG/2ML IJ SOLN: 25.0000 ug | INTRAMUSCULAR | Status: DC | PRN

## 2021-04-04 MED ORDER — PROPOFOL 10 MG/ML IV BOLUS
INTRAVENOUS | Status: DC | PRN
  Administered 2021-04-04: 100 mg via INTRAVENOUS

## 2021-04-04 MED ORDER — CIPROFLOXACIN IN D5W 400 MG/200ML IV SOLN
INTRAVENOUS | Status: AC
  Filled 2021-04-04: qty 200

## 2021-04-04 MED ORDER — ACETAMINOPHEN 500 MG PO TABS
1000.0000 mg | ORAL_TABLET | Freq: Once | ORAL | Status: AC
  Administered 2021-04-04: 1000 mg via ORAL

## 2021-04-04 MED ORDER — TRAMADOL HCL 50 MG PO TABS
ORAL_TABLET | ORAL | Status: AC
  Filled 2021-04-04: qty 1

## 2021-04-04 SURGICAL SUPPLY — 28 items
BAG DRAIN URO-CYSTO SKYTR STRL (DRAIN) ×2 IMPLANT
BAG DRN UROCATH (DRAIN) ×1
BASKET STONE 1.7 NGAGE (UROLOGICAL SUPPLIES) IMPLANT
BULB IRRIG PATHFIND (MISCELLANEOUS) IMPLANT
CATH URET 5FR 28IN OPEN ENDED (CATHETERS) ×2 IMPLANT
CLOTH BEACON ORANGE TIMEOUT ST (SAFETY) ×2 IMPLANT
EXTRACTOR STONE 1.7FRX115CM (UROLOGICAL SUPPLIES) IMPLANT
FIBER LASER FLEXIVA 365 (UROLOGICAL SUPPLIES) IMPLANT
GLOVE SURG ENC MOIS LTX SZ7.5 (GLOVE) ×2 IMPLANT
GOWN STRL REUS W/TWL XL LVL3 (GOWN DISPOSABLE) ×2 IMPLANT
GUIDEWIRE STR DUAL SENSOR (WIRE) ×2 IMPLANT
GUIDEWIRE ZIPWRE .038 STRAIGHT (WIRE) IMPLANT
IV NS 1000ML (IV SOLUTION)
IV NS 1000ML BAXH (IV SOLUTION) IMPLANT
IV NS IRRIG 3000ML ARTHROMATIC (IV SOLUTION) ×2 IMPLANT
KIT TURNOVER CYSTO (KITS) ×2 IMPLANT
MANIFOLD NEPTUNE II (INSTRUMENTS) ×2 IMPLANT
NS IRRIG 500ML POUR BTL (IV SOLUTION) ×2 IMPLANT
PACK CYSTO (CUSTOM PROCEDURE TRAY) ×2 IMPLANT
SHEATH URETERAL 12FRX35CM (MISCELLANEOUS) ×2 IMPLANT
STENT URET 6FRX22 CONTOUR (STENTS) ×2 IMPLANT
STENT URET 6FRX26 CONTOUR (STENTS) IMPLANT
SYR 10ML LL (SYRINGE) ×2 IMPLANT
SYR 20ML LL LF (SYRINGE) ×2 IMPLANT
TRACTIP FLEXIVA PULS ID 200XHI (Laser) ×1 IMPLANT
TRACTIP FLEXIVA PULSE ID 200 (Laser) ×2
TUBE CONNECTING 12X1/4 (SUCTIONS) ×2 IMPLANT
TUBING UROLOGY SET (TUBING) ×2 IMPLANT

## 2021-04-04 NOTE — Progress Notes (Signed)
Spoke with Dr. Milford Cage about skin tear on left hand of patient. Keep clean and dry until she returns to the office and he will assess.  Skin tear on left hand has steri strips dry and intact with telfa and coban. 2x2's and more coban applied to cover entire area of steristrips to keep it clean and dry. Patient is aware that it needs to stay dry and voices understanding.

## 2021-04-04 NOTE — Anesthesia Procedure Notes (Signed)
Procedure Name: LMA Insertion Date/Time: 04/04/2021 2:27 PM Performed by: Suan Halter, CRNA Pre-anesthesia Checklist: Patient identified, Emergency Drugs available, Suction available and Patient being monitored Patient Re-evaluated:Patient Re-evaluated prior to induction Oxygen Delivery Method: Circle system utilized Preoxygenation: Pre-oxygenation with 100% oxygen Induction Type: IV induction Ventilation: Mask ventilation without difficulty LMA: LMA inserted LMA Size: 4.0 Number of attempts: 1 Airway Equipment and Method: Bite block Placement Confirmation: positive ETCO2 Tube secured with: Tape Dental Injury: Teeth and Oropharynx as per pre-operative assessment

## 2021-04-04 NOTE — Op Note (Signed)
Preoperative diagnosis:  1.  Right renal pelvis stone  Postoperative diagnosis: 1.  Same  Procedure(s): 1.  Cystoscopy, right retrograde pyelogram, right ureteroscopy and pyeloscopy with laser lithotripsy of right renal pelvic stone, insertion right JJ stent  Surgeon: Dr. Harold Barban  Anesthesia: General  Complications: None  EBL: Minimal  Specimens: None  Disposition of specimens: Not applicable  Intraoperative findings: Approximate 1 cm very soft matrix type stone in the renal pelvis/UPJ.  Holmium laser utilized to dust the stone at 53 and 0.2 power.  Stone completely dusted into small particles and was very soft.  6 [place] by 22 cm soft contour stent placed with no tether  Indication: 75 year old white female presented with severe right-sided flank pain.  Found to have a 1 cm right UPJ stone with hydronephrosis.  Presents this time undergo cystoscopy right ureteroscopy and pyeloscopy and laser lithotripsy and insertion of right JJ stent  Description of procedure:  After obtaining informed consent of the patient he was taken major cystoscopy suite placed under general anesthesia.  She placed in the dorsolithotomy position genitalia prepped and draped in usual sterile fashion.  Proper pause and timeout was performed for site of procedure.  2 French cystoscope was advanced in the bladder without difficulty.  Right ureteral orifice was identified.  5 French open tip catheter utilized to cannulate the right ureteral orifice and retrograde pyelogram showed clean ureter with no hydronephrosis up to the renal pelvis where a large filling defect was noted within the renal pelvis consistent with the stone seen on preop CT scan.  A sensor wire was subsequently passed up to the renal pelvis and coiled and the cystoscope was removed leaving the guidewire in place.  The medium 1214 French ureteral access sheath was then placed over the guidewire first utilizing the obturator under fluoroscopic  vision which easily passed.  The obturator and access sheath then passed up to the region of the ureteropelvic junction under fluoroscopy.  The obturator was removed.  The flexible digital scope was then advanced to the renal pelvis and the stone was encountered.  The 200 m holmium laser fiber was then utilized at a dust setting of 53 and 0.2 to fragment the stone into numerous small pieces.  The stone was very soft and easily fragmented and dusted out.  The remaining calyces were inspected and no significant fragments were left.  Significant amount of the soft stone irrigated out through the sheath after treatment.  The guidewire was then passed up the renal pelvis and the access sheath and ureteroscope were removed visualizing ureter along its length with no stone fragments in the ureter.  The wire was then back fed through the cystoscope and a 6 [place] by 22 cm Percuflex plus soft Contour stent was placed leaving a proximal coil in the renal pelvis and a distal coil in the bladder.  There is good flow of urine through and around the stent noted.  Bladder was emptied procedure terminated.  She was awakened from anesthesia and taken back to the recovery room in stable condition.  No immediate complication from the procedure.

## 2021-04-04 NOTE — Anesthesia Postprocedure Evaluation (Signed)
Anesthesia Post Note  Patient: Hannah [name]  Procedure(s) Performed: CYSTOSCOPY/URETEROSCOPY/HOLMIUM LASER/STONE EXTRACTION AND STENT PLACEMENT (Right: Pelvis)     Patient location during evaluation: PACU Anesthesia Type: General Level of consciousness: awake and alert and oriented Pain management: pain level controlled Vital Signs Assessment: post-procedure vital signs reviewed and stable Respiratory status: spontaneous breathing, nonlabored ventilation and respiratory function stable Cardiovascular status: blood pressure returned to baseline Postop Assessment: no apparent nausea or vomiting Anesthetic complications: no   No notable events documented.  Last Vitals:  Vitals:   04/04/21 1530 04/04/21 1545  BP: (!) 158/52 (!) 155/51  Pulse: 68 61  Resp: 14 14  Temp:    SpO2: 100% 99%    Last Pain:  Vitals:   04/04/21 1545  TempSrc:   PainSc: 0-No pain                 Marthenia Rolling

## 2021-04-04 NOTE — Discharge Instructions (Addendum)
Post Anesthesia Home Care Instructions  Activity: Get plenty of rest for the remainder of the day. A responsible individual must stay with you for 24 hours following the procedure.  For the next 24 hours, DO NOT: -Drive a car -Operate machinery -Drink alcoholic beverages -Take any medication unless instructed by your physician -Make any legal decisions or sign important papers.  Meals: Start with liquid foods such as gelatin or soup. Progress to regular foods as tolerated. Avoid greasy, spicy, heavy foods. If nausea and/or vomiting occur, drink only clear liquids until the nausea and/or vomiting subsides. Call your physician if vomiting continues.  Special Instructions/Symptoms: Your throat may feel dry or sore from the anesthesia or the breathing tube placed in your throat during surgery. If this causes discomfort, gargle with warm salt water. The discomfort should disappear within 24 hours.   [facility] [phone] Post Ureteroscopy With Stent Instructions  Definitions:  Ureter: The duct that transports urine from the kidney to the bladder. Stent:   A plastic hollow tube that is placed into the ureter, from the kidney to the bladder to prevent the ureter from swelling shut.  GENERAL INSTRUCTIONS:  Despite the fact that no skin incisions were used, the area around the ureter and bladder is raw and irritated. The stent is a foreign body which will further irritate the bladder wall. This irritation is manifested by increased frequency of urination, both day and night, and by an increase in the urge to urinate. In some, the urge to urinate is present almost always. Sometimes the urge is strong enough that you may not be able to stop yourself from urinating. The only real cure is to remove the stent and then give time for the bladder wall to heal which can't be done until the danger of the ureter swelling shut has passed, which varies.  You may see some blood in your  urine while the stent is in place and a few days afterwards. Do not be alarmed, even if the urine was clear for a while. Get off your feet and drink lots of fluids until clearing occurs. If you start to pass clots or don't improve, call us.  DIET: You may return to your normal diet immediately. Because of the raw surface of your bladder, alcohol, spicy foods, acid type foods and drinks with caffeine may cause irritation or frequency and should be used in moderation. To keep your urine flowing freely and to avoid constipation, drink plenty of fluids during the day ( 8-10 glasses ). Tip: Avoid cranberry juice because it is very acidic.  ACTIVITY: Your physical activity doesn't need to be restricted. However, if you are very active, you may see some blood in your urine. We suggest that you reduce your activity under these circumstances until the bleeding has stopped.  BOWELS: It is important to keep your bowels regular during the postoperative period. Straining with bowel movements can cause bleeding. A bowel movement every other day is reasonable. Use a mild laxative if needed, such as Milk of Magnesia 2-3 tablespoons, or 2 Dulcolax tablets. Call if you continue to have problems. If you have been taking narcotics for pain, before, during or after your surgery, you may be constipated. Take a laxative if necessary.   MEDICATION: You should resume your pre-surgery medications unless told not to. In addition you will often be given an antibiotic to prevent infection. These should be taken as prescribed until the bottles are finished unless you are having an unusual   reaction to one of the drugs.  PROBLEMS YOU SHOULD REPORT TO US: Fevers over 100.5 Fahrenheit. Heavy bleeding, or clots ( See above notes about blood in urine ). Inability to urinate. Drug reactions ( hives, rash, nausea, vomiting, diarrhea ). Severe burning or pain with urination that is not improving.  FOLLOW-UP: You will need a  follow-up appointment to monitor your progress. Call for this appointment at the number listed above. Usually the first appointment will be about three to fourteen days after your surgery.        

## 2021-04-04 NOTE — Transfer of Care (Signed)
Immediate Anesthesia Transfer of Care Note  Patient: Hannah [name]  Procedure(s) Performed: CYSTOSCOPY/URETEROSCOPY/HOLMIUM LASER/STONE EXTRACTION AND STENT PLACEMENT (Right: Pelvis)  Patient Location: PACU  Anesthesia Type:General  Level of Consciousness: drowsy  Airway & Oxygen Therapy: Patient Spontanous Breathing and Patient connected to nasal cannula oxygen  Post-op Assessment: Report given to RN  Post vital signs: Reviewed and stable  Last Vitals:  Vitals Value Taken Time  BP 176/56 04/04/21 1518  Temp 36.5 C 04/04/21 1518  Pulse 65 04/04/21 1530  Resp 15 04/04/21 1530  SpO2 100 % 04/04/21 1530  Vitals shown include unvalidated device data.  Last Pain:  Vitals:   04/04/21 1317  TempSrc: Oral      Patients Stated Pain Goal: 5 (59/09/31 1216)  Complications: No notable events documented.

## 2021-04-04 NOTE — Progress Notes (Signed)
Instructed patient and wrote on discharge instructions to wait prescribed amount of time before taking more tramadol at home. Understanding voiced by patient and her spouse.

## 2021-04-04 NOTE — Interval H&P Note (Signed)
History and Physical Interval Note:  04/04/2021 12:39 PM  Hannah [name]  has presented today for surgery, with the diagnosis of RIGHT UPJ STONE.  The various methods of treatment have been discussed with the patient and family. After consideration of risks, benefits and other options for treatment, the patient has consented to  Procedure(s) with comments: CYSTOSCOPY/URETEROSCOPY/HOLMIUM LASER/STONE EXTRACTION AND STENT PLACEMENT (Right) - 1 HR as a surgical intervention.  The patient's history has been reviewed, patient examined, no change in status, stable for surgery.  I have reviewed the patient's chart and labs.  Questions were answered to the patient's satisfaction.     Remi Haggard

## 2021-04-05 ENCOUNTER — Encounter (HOSPITAL_BASED_OUTPATIENT_CLINIC_OR_DEPARTMENT_OTHER): Payer: Self-pay | Admitting: Urology

## 2021-04-10 ENCOUNTER — Other Ambulatory Visit (HOSPITAL_COMMUNITY): Payer: Self-pay | Admitting: Cardiology

## 2021-04-12 ENCOUNTER — Other Ambulatory Visit: Payer: Medicare Other

## 2021-04-14 ENCOUNTER — Inpatient Hospital Stay: Payer: Medicare Other

## 2021-04-14 ENCOUNTER — Other Ambulatory Visit: Payer: Self-pay

## 2021-04-14 ENCOUNTER — Other Ambulatory Visit: Payer: Self-pay | Admitting: *Deleted

## 2021-04-14 ENCOUNTER — Inpatient Hospital Stay: Payer: Medicare Other | Attending: Adult Health

## 2021-04-14 VITALS — BP 121/51 | HR 77 | Temp 99.0°F | Resp 16

## 2021-04-14 DIAGNOSIS — D631 Anemia in chronic kidney disease: Secondary | ICD-10-CM | POA: Insufficient documentation

## 2021-04-14 DIAGNOSIS — Z79899 Other long term (current) drug therapy: Secondary | ICD-10-CM | POA: Insufficient documentation

## 2021-04-14 DIAGNOSIS — D638 Anemia in other chronic diseases classified elsewhere: Secondary | ICD-10-CM

## 2021-04-14 DIAGNOSIS — I129 Hypertensive chronic kidney disease with stage 1 through stage 4 chronic kidney disease, or unspecified chronic kidney disease: Secondary | ICD-10-CM | POA: Insufficient documentation

## 2021-04-14 DIAGNOSIS — N1832 Chronic kidney disease, stage 3b: Secondary | ICD-10-CM | POA: Insufficient documentation

## 2021-04-14 LAB — CBC WITH DIFFERENTIAL (CANCER CENTER ONLY)
Abs Immature Granulocytes: 0.02 10*3/uL (ref 0.00–0.07)
Basophils Absolute: 0.1 10*3/uL (ref 0.0–0.1)
Basophils Relative: 1 %
Eosinophils Absolute: 0.3 10*3/uL (ref 0.0–0.5)
Eosinophils Relative: 5 %
HCT: 30.2 % — ABNORMAL LOW (ref 36.0–46.0)
Hemoglobin: 8.9 g/dL — ABNORMAL LOW (ref 12.0–15.0)
Immature Granulocytes: 0 %
Lymphocytes Relative: 9 %
Lymphs Abs: 0.5 10*3/uL — ABNORMAL LOW (ref 0.7–4.0)
MCH: 29.9 pg (ref 26.0–34.0)
MCHC: 29.5 g/dL — ABNORMAL LOW (ref 30.0–36.0)
MCV: 101.3 fL — ABNORMAL HIGH (ref 80.0–100.0)
Monocytes Absolute: 0.6 10*3/uL (ref 0.1–1.0)
Monocytes Relative: 10 %
Neutro Abs: 4.4 10*3/uL (ref 1.7–7.7)
Neutrophils Relative %: 75 %
Platelet Count: 117 10*3/uL — ABNORMAL LOW (ref 150–400)
RBC: 2.98 MIL/uL — ABNORMAL LOW (ref 3.87–5.11)
RDW: 14.8 % (ref 11.5–15.5)
WBC Count: 5.8 10*3/uL (ref 4.0–10.5)
nRBC: 0 % (ref 0.0–0.2)

## 2021-04-14 MED ORDER — DARBEPOETIN ALFA 500 MCG/ML IJ SOSY
500.0000 ug | PREFILLED_SYRINGE | Freq: Once | INTRAMUSCULAR | Status: AC
  Administered 2021-04-14: 500 ug via SUBCUTANEOUS
  Filled 2021-04-14: qty 1

## 2021-04-15 IMAGING — DX DG CHEST 2V
2 series · 2 of 2 positions shown · non-contrast
Comparison: PA and lateral chest 07/08/2019.

CLINICAL DATA: History of right pleural effusion. Status post
removal of a pleural drainage catheter.

EXAM:
CHEST - 2 VIEW

[dg chest 2 view (1 of 2)]
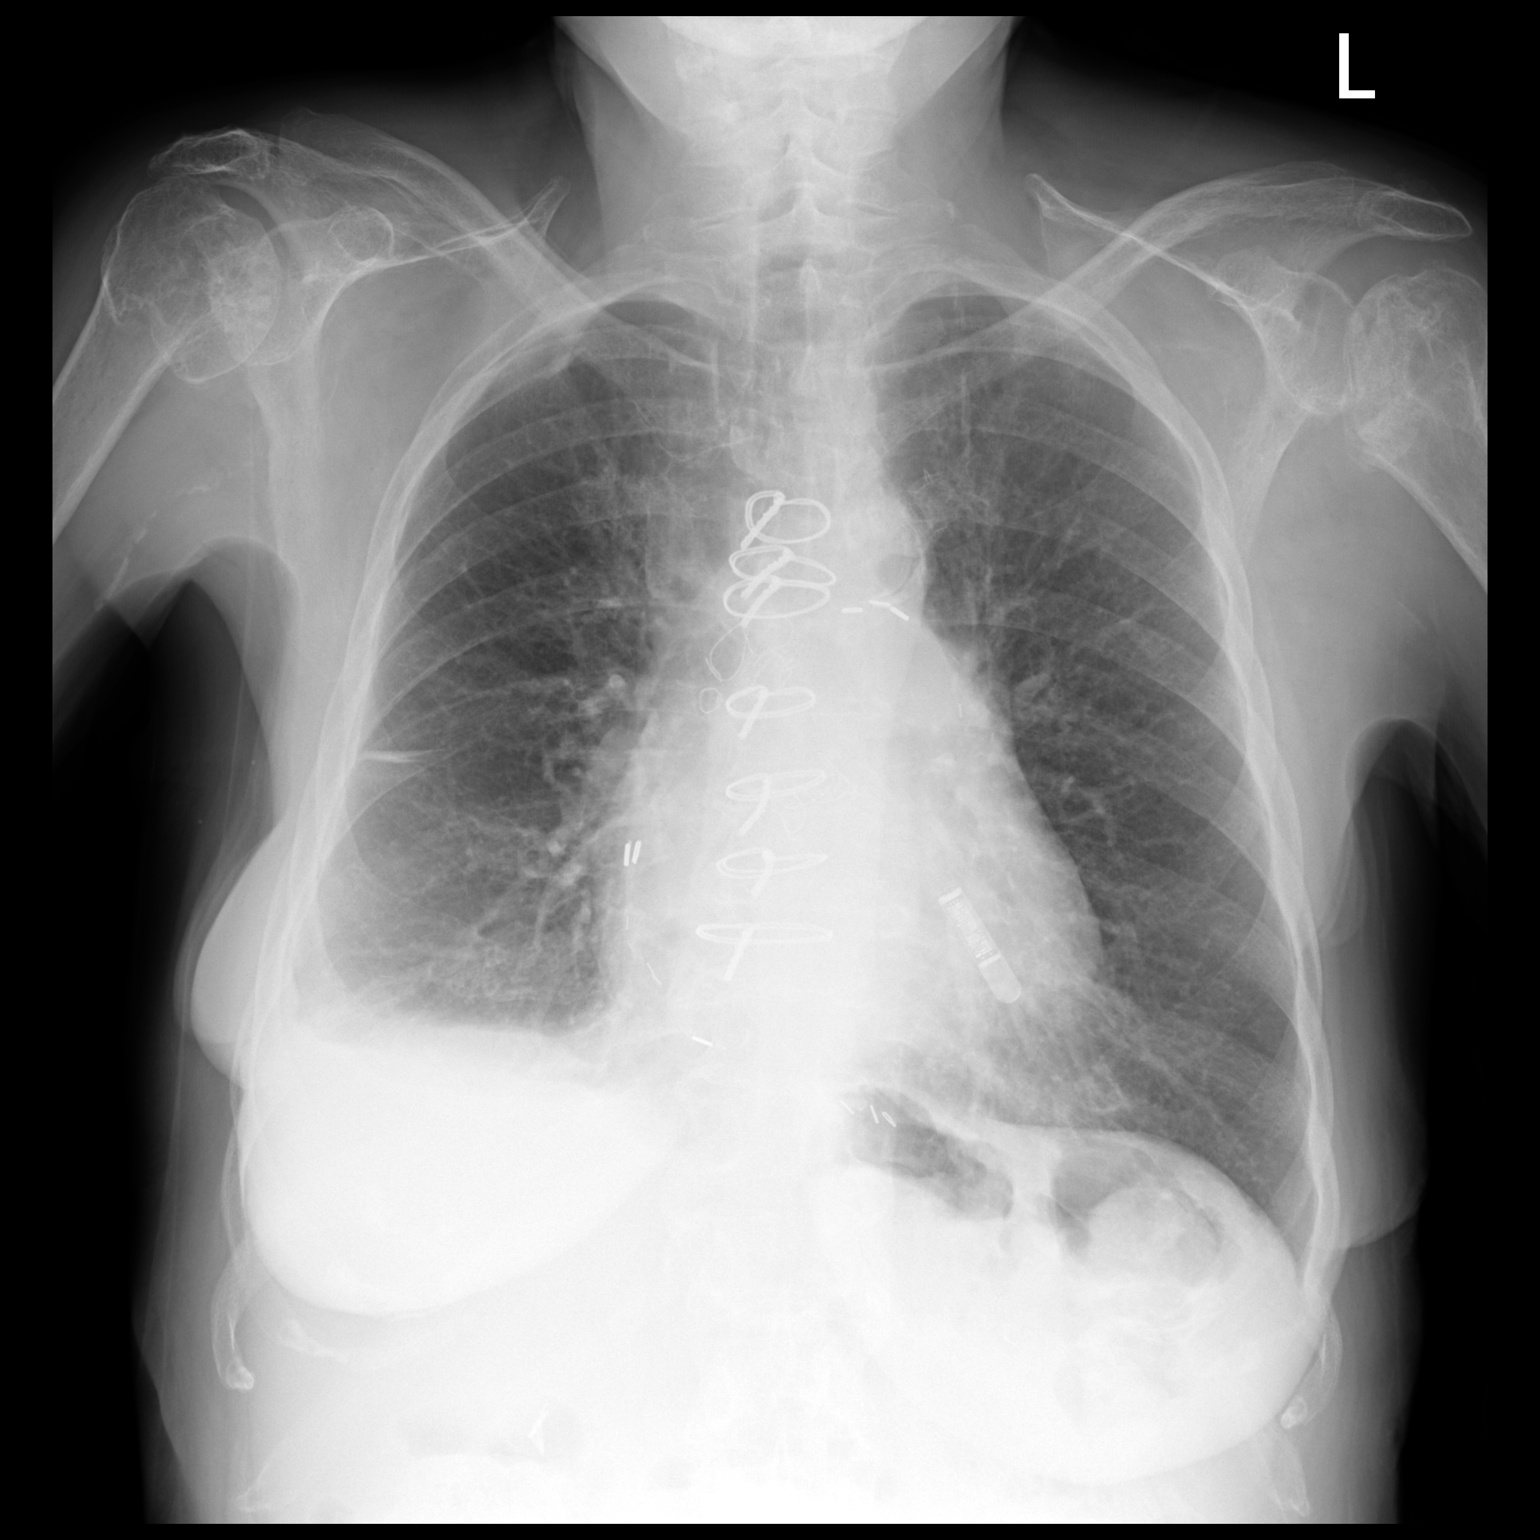

[dg chest 2 view (2 of 2)]
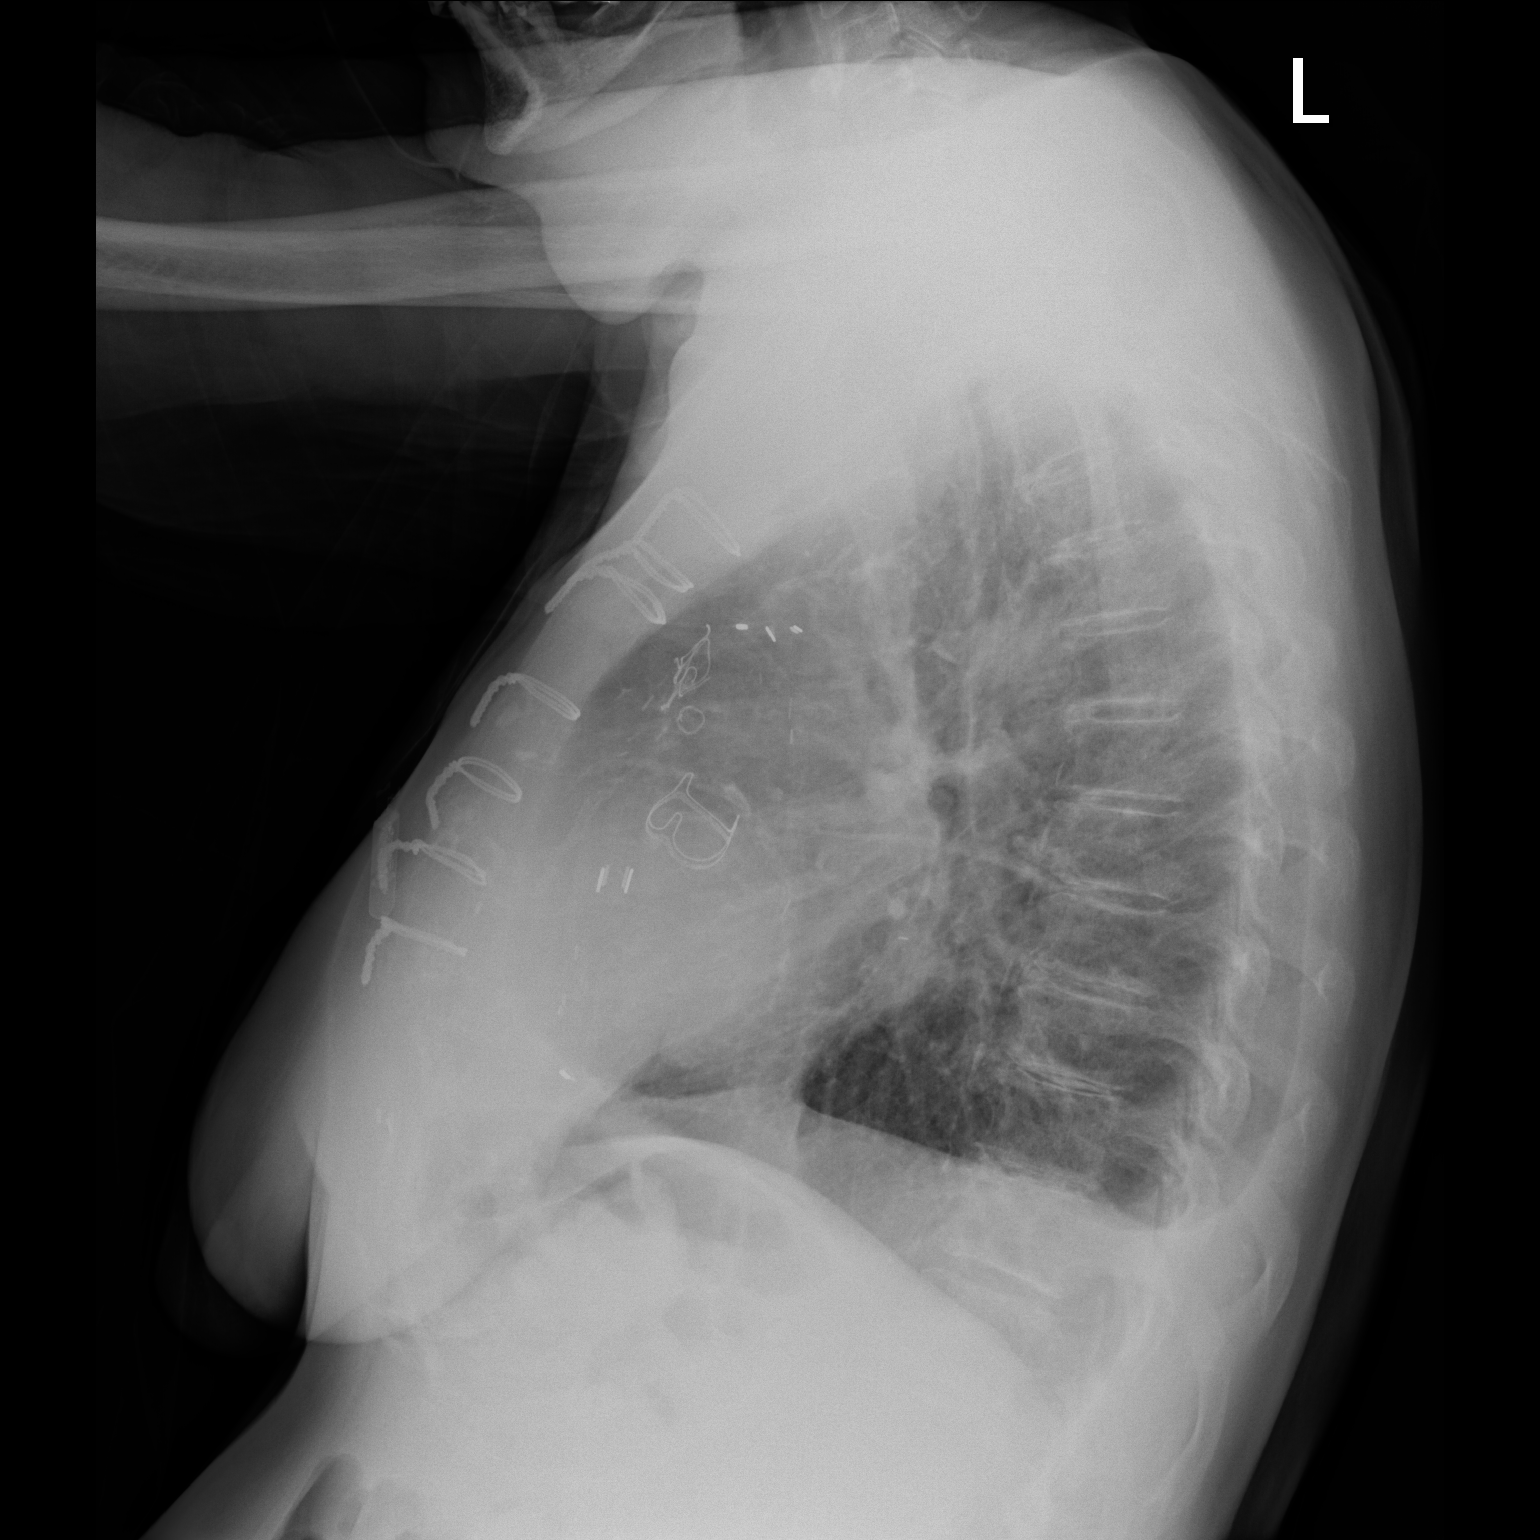

[2 of 2 positions shown; findings below may reference images not displayed]

FINDINGS: Pleural drainage catheter in the right chest has been removed. Small
right pleural effusion and basilar atelectasis are unchanged. There
is no left pleural effusion. The left lung is clear. The patient is
status post CABG. Heart size is normal. Atherosclerosis is
identified. Loop recorder is in place. Remote proximal humerus
fractures bilaterally are seen.
IMPRESSION: Status post removal of a right pleural drainage catheter. Negative
for pneumothorax.

No change in a small right pleural effusion and basilar atelectasis.

Atherosclerosis.

## 2021-04-20 ENCOUNTER — Ambulatory Visit (HOSPITAL_COMMUNITY)
Admission: RE | Admit: 2021-04-20 | Discharge: 2021-04-20 | Disposition: A | Discharge status: Home / Self Care | Payer: Medicare Other | Source: Ambulatory Visit | Attending: Cardiology | Admitting: Cardiology

## 2021-04-20 ENCOUNTER — Other Ambulatory Visit (INDEPENDENT_AMBULATORY_CARE_PROVIDER_SITE_OTHER): Payer: Medicare Other

## 2021-04-20 ENCOUNTER — Other Ambulatory Visit: Payer: Self-pay

## 2021-04-20 ENCOUNTER — Encounter (HOSPITAL_COMMUNITY): Payer: Self-pay | Admitting: Cardiology

## 2021-04-20 VITALS — BP 142/60 | HR 65 | Wt 142.6 lb

## 2021-04-20 DIAGNOSIS — Z8616 Personal history of COVID-19: Secondary | ICD-10-CM | POA: Insufficient documentation

## 2021-04-20 DIAGNOSIS — I951 Orthostatic hypotension: Secondary | ICD-10-CM | POA: Diagnosis not present

## 2021-04-20 DIAGNOSIS — I5043 Acute on chronic combined systolic (congestive) and diastolic (congestive) heart failure: Secondary | ICD-10-CM

## 2021-04-20 DIAGNOSIS — I495 Sick sinus syndrome: Secondary | ICD-10-CM | POA: Diagnosis not present

## 2021-04-20 DIAGNOSIS — Z953 Presence of xenogenic heart valve: Secondary | ICD-10-CM | POA: Insufficient documentation

## 2021-04-20 DIAGNOSIS — I480 Paroxysmal atrial fibrillation: Secondary | ICD-10-CM | POA: Diagnosis not present

## 2021-04-20 DIAGNOSIS — R002 Palpitations: Secondary | ICD-10-CM | POA: Insufficient documentation

## 2021-04-20 DIAGNOSIS — E785 Hyperlipidemia, unspecified: Secondary | ICD-10-CM | POA: Diagnosis not present

## 2021-04-20 DIAGNOSIS — Z794 Long term (current) use of insulin: Secondary | ICD-10-CM

## 2021-04-20 DIAGNOSIS — E1165 Type 2 diabetes mellitus with hyperglycemia: Secondary | ICD-10-CM | POA: Diagnosis not present

## 2021-04-20 DIAGNOSIS — D631 Anemia in chronic kidney disease: Secondary | ICD-10-CM | POA: Diagnosis not present

## 2021-04-20 DIAGNOSIS — Z951 Presence of aortocoronary bypass graft: Secondary | ICD-10-CM | POA: Diagnosis not present

## 2021-04-20 DIAGNOSIS — R42 Dizziness and giddiness: Secondary | ICD-10-CM | POA: Insufficient documentation

## 2021-04-20 DIAGNOSIS — E1122 Type 2 diabetes mellitus with diabetic chronic kidney disease: Secondary | ICD-10-CM | POA: Insufficient documentation

## 2021-04-20 DIAGNOSIS — N183 Chronic kidney disease, stage 3 unspecified: Secondary | ICD-10-CM | POA: Diagnosis not present

## 2021-04-20 DIAGNOSIS — Z79899 Other long term (current) drug therapy: Secondary | ICD-10-CM | POA: Insufficient documentation

## 2021-04-20 DIAGNOSIS — I2510 Atherosclerotic heart disease of native coronary artery without angina pectoris: Secondary | ICD-10-CM | POA: Insufficient documentation

## 2021-04-20 DIAGNOSIS — Z7901 Long term (current) use of anticoagulants: Secondary | ICD-10-CM | POA: Insufficient documentation

## 2021-04-20 DIAGNOSIS — I5032 Chronic diastolic (congestive) heart failure: Secondary | ICD-10-CM

## 2021-04-20 DIAGNOSIS — I130 Hypertensive heart and chronic kidney disease with heart failure and stage 1 through stage 4 chronic kidney disease, or unspecified chronic kidney disease: Secondary | ICD-10-CM | POA: Insufficient documentation

## 2021-04-20 LAB — COMPREHENSIVE METABOLIC PANEL
ALT: 26 U/L (ref 0–44)
AST: 31 U/L (ref 15–41)
Albumin: 2.6 g/dL — ABNORMAL LOW (ref 3.5–5.0)
Alkaline Phosphatase: 63 U/L (ref 38–126)
Anion gap: 6 (ref 5–15)
BUN: 21 mg/dL (ref 8–23)
CO2: 30 mmol/L (ref 22–32)
Calcium: 8.9 mg/dL (ref 8.9–10.3)
Chloride: 100 mmol/L (ref 98–111)
Creatinine, Ser: 2.29 mg/dL — ABNORMAL HIGH (ref 0.44–1.00)
GFR, Estimated: 22 mL/min — ABNORMAL LOW (ref >60)
Glucose, Bld: 161 mg/dL — ABNORMAL HIGH (ref 70–99)
Potassium: 4.6 mmol/L (ref 3.5–5.1)
Sodium: 136 mmol/L (ref 135–145)
Total Bilirubin: 0.6 mg/dL (ref 0.3–1.2)
Total Protein: 5.7 g/dL — ABNORMAL LOW (ref 6.5–8.1)

## 2021-04-20 LAB — CBC
HCT: 33 % — ABNORMAL LOW (ref 36.0–46.0)
Hemoglobin: 9.4 g/dL — ABNORMAL LOW (ref 12.0–15.0)
MCH: 30.1 pg (ref 26.0–34.0)
MCHC: 28.5 g/dL — ABNORMAL LOW (ref 30.0–36.0)
MCV: 105.8 fL — ABNORMAL HIGH (ref 80.0–100.0)
Platelets: 145 10*3/uL — ABNORMAL LOW (ref 150–400)
RBC: 3.12 MIL/uL — ABNORMAL LOW (ref 3.87–5.11)
RDW: 14.9 % (ref 11.5–15.5)
WBC: 4.8 10*3/uL (ref 4.0–10.5)
nRBC: 0 % (ref 0.0–0.2)

## 2021-04-20 LAB — HEMOGLOBIN A1C: Hgb A1c MFr Bld: 6.6 % — ABNORMAL HIGH (ref 4.6–6.5)

## 2021-04-20 NOTE — Patient Instructions (Signed)
Labs done today, your results will be available in MyChart, we will contact you for abnormal readings.  Your physician recommends that you schedule a follow-up appointment in: 6 months (May 2023), **PLEASE CALL OUR OFFICE IN MARCH TO SCHEDULE THIS APPOINTMENT  If you have any questions or concerns before your next appointment please send [place] a message through [place] or call our office at [phone].    TO LEAVE A MESSAGE FOR THE NURSE SELECT OPTION 2, PLEASE LEAVE A MESSAGE INCLUDING: YOUR NAME DATE OF BIRTH CALL BACK NUMBER REASON FOR CALL**this is important as we prioritize the call backs  YOU WILL RECEIVE A CALL BACK THE SAME DAY AS LONG AS YOU CALL BEFORE 4:00 PM  At the [facility], you and your health needs are our priority. As part of our continuing mission to provide you with exceptional heart care, we have created designated Provider Care Teams. These Care Teams include your primary Cardiologist (physician) and Advanced Practice Providers (APPs- Physician Assistants and Nurse Practitioners) who all work together to provide you with the care you need, when you need it.   You may see any of the following providers on your designated Care Team at your next follow up: Dr Glori Bickers Dr Haynes Kerns, NP Lyda Jester, [place] [facility] [place], [place] Audry Riles, PharmD   Please be sure to bring in all your medications bottles to every appointment.

## 2021-04-21 ENCOUNTER — Other Ambulatory Visit: Payer: Medicare Other

## 2021-04-21 NOTE — Progress Notes (Signed)
Patient ID: Hannah [name], female   DOB: 06-29-1945, 75 y.o.   MRN: [id number] PCP: Dr. Suzy Bouchard Cardiology: Dr. Aundra Dubin  75 y.o. with history of CAD s/p CABG, bioprosthetic AVR, CKD, paroxysmal atrial fibrillation presents for cardiology followup.  She had her initial CABG in 1998, then had redo CABG in 2009 with bioprosthetic AVR.    In 9/14, she developed tachypalpitations and later chest pain.  In the ER, SBP was in the 200s with elevated HR. Troponin was mildly elevated.  Therefore, she had a cardiac cath showing patent grafts.  She was thought to have had demand ischemia in the setting of hypertensive emergency + tachycardia.  After discharge, she continued to have runs of tachypalpitations.  A 3 week event monitor was put on her.  This showed runs, sometimes long, of atrial tachycardia. She was started on Toprol XL when these runs were noted.  No further palpitations (complete resolution of symptoms).    In 1/15, she was admitted to [facility] with atypical chest pain.  She was ruled out for MI and Lexiscan Cardiolite showed no ischemia.  She was started on Imdur and sent home.  After discharge, she developed a daily headache and nausea.  She did not eat or drink much due to the nausea. Shortly after this, she was standing in her grandson's pediatrician's office and became severely lightheaded.  She had to sit down.  She came to the ER and was found to be orthostatic.  She was given IV fluid and got better quickly.  I had her stop Imdur and symptoms seem to have completely resolved.    Event monitor in 8/16 showed atrial tachycardia versus atypical atrial flutter.  Toprol XL was increased.  She saw Dr Lovena Le who thought that she had short runs of atrial tachycardia and not atrial flutter.  Subsequently, she has been found to have paroxysmal atrial fibrillation and is on Eliquis.     Last echo in 11/21 showed EF 55-60%, mild LVH, normal RV, normal bioprosthetic aortic valve.  Cardiolite in 11/21 showed  no ischemia/infarction.   She has problems with anemia and has had multiple transfusions.  However, it has been a number of months since she required transfusion. She is getting Aranesp.   She has had significant problems with orthostatic intolerance and syncope.  She is taking midodrine now.  Last syncopal episode was in 12/21.  She had a loop recorder placed which has recently shown HR to 30s at times when she has symptomatic lightheadedness.  Biotronik PPM placed.   PYP scan in 3/22 was not suggestive of TTR amyloidosis.  Echo in 4/22 showed EF 60-65%, mild LVH, s/p TAVR with mean gradient 11 mmHg.   She returns for followup of diastolic CHF, paroxysmal atrial fibrillation.  Rare palpitations, mild rare dizziness.  She uses her walker when out of the house for stability.  Chronic anemia, followed by Dr. Lindi Adie (gets periodic transfusions and iron).  She has not used Lasix.  No significant exertional dyspnea though not very active.  Rare atypical chest pain.    ECG: a-paced, left axis deviation (personally reviewed)  Labs (2/13): BNP 80, K 5.3, creatinine 1.1 Labs (6/13): LDL 76, HDL 41 Labs (8/13): K 4.3, creatinine 1.38 Labs (9/13): creatinine 1.6, proBNP 128 Labs (9/14): K 4.7, creatinine 1.5, LDL 82, HDL 35 Labs (10/14): K 4.8, creatinine 1.5 Labs (1/15): LDL 105, HDL 37 Labs (2/15): K 4.1, creatinine 1.28, HCT 34 Labs (4/15): K 4.2, creatinine 1.5 => 1.4,  BNP 95 Labs (1/16): K 3.8, creatinine 1.6, Hgb 10.5 Labs (7/16): K 3.7, creatinine 1.77, LDL 49, HDL 27 Labs (1/17): LDL 58, HDL 38, HCT 35.4 Labs (4/17): K 4.5, creatinine 1.47 Labs (1/22): K 4.3, creatinine 2.03, hgb 9.7, BNP 318 Labs (2/22): K 4.8, creatinine 2.83, hgb 10.4, AST 84, ALT 117 Labs (5/22): K 4.5, creatinine 2.4 Labs (10/22): LDL 46, TSH normal, K 5, creatinine 2.54  PMH: 1. Diabetes mellitus 2. HTN 3. Obesity 4. Atrial arrhythmias: Atrial tachycardia and paroxysmal atrial fibrillation.    5. CAD: CABG 1998  with LIMA-LAD/diagonal, sequential SVG-OM1/OM2, SVG-RCA.  Redo CABG 2009 with SVG-OM3, SVG-PDA. Lexiscan myoview (9/13): EF 61%, no ischemia or infarction.  LHC (10/14) with all grafts patent except the old SVG-RCA.  There was 90% stenosis in the AV LCx before ungrafted MOM, but this was unchanged from prior study.  Lexiscan Cardiolite (1/15) with no ischemia. She does not tolerate Imdur.  - Cardiolite (11/21): EF 61%, no ischemia/infarction.  6. CKD 7. Bioprosthetic aortic valve: Echo (2011) with EF 50-55%, bioprosthetic aortic valve well-seated.  Echo (2/13) with EF 60-65%, bioprosthetic aortic valve looked ok.  Echo (9/14): EF 50-55%, mild LVH, bioprosthetic AVR with mean gradient 10, mild MR, moderate TR.  - Echo in 11/21 with stable bioprosthetic aortic valve.  8. Asthma: since her teens. 9. Low back pain.  10. 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  Per neurologist, did not appear cardioembolic.   11. Carotid stenosis: 40-59% bilateral ICA stenosis on 10/14 carotid dopplers. Carotid doppler (11/15) with 40-59% bilateral ICA stenosis.  Carotid dopplers (11/16) with 40-59% BICA stenosis.  - Carotid dopplers (8/21): 1-39% BICA stenosis.  12. Sleep study 12/14 without significant OSA.  13. Chronic diastolic CHF - Echo (40/98): EF 55-60%, mild LVH, RV normal, bioprosthetic aortic valve with mean gradient 8 mmHg and no AI.  - PYP scan (3/22): Negative, H/CL 1.1, grade 1.  - Echo (4/22): EF 60-65%, mild LVH, s/p TAVR with mean gradient 11 mmHg.  14. Breast cancer: Diagnosed 2017.  She had chemotherapy and right lumpectomy.  15. Anemia of renal disease 16. Tachy-brady syndrome: Symptomatic bradycardia.  - Biotronik PPM.  17. COVID-19 infection 2/21  SH: Never smoked.  Lives in [place].  Married.    FH: No premature CAD.   ROS: All systems reviewed and negative except as per HPI.    Current Outpatient Medications  Medication Sig Dispense Refill   Accu-Chek FastClix Lancets MISC  Use to check blood sugar 3 times per day 100 each 5   ACCU-CHEK GUIDE test strip Use to test blood sugar 3 times daily 150 strip 3   acetaminophen (TYLENOL) 500 MG tablet Take 1,000 mg by mouth as needed for moderate pain.     albuterol (PROVENTIL) (2.5 MG/3ML) 0.083% nebulizer solution Take 3 mLs (2.5 mg total) by nebulization every 6 (six) hours as needed for wheezing or shortness of breath. 75 mL 3   albuterol (VENTOLIN HFA) 108 (90 Base) MCG/ACT inhaler Inhale 2 puffs into the lungs every 6 (six) hours as needed for wheezing or shortness of breath. 6.7 g 10   amiodarone (PACERONE) 200 MG tablet TAKE ONE TABLET BY MOUTH DAILY 90 tablet 2   apixaban (ELIQUIS) 2.5 MG TABS tablet Take 1 tablet (2.5 mg total) by mouth 2 (two) times daily. 180 tablet 1   Blood Glucose Monitoring Suppl (ACCU-CHEK GUIDE ME) w/Device KIT Use accu chek meter to check blood sugar three times daily. 1  kit 0   cetirizine (ZYRTEC) 10 MG tablet Take 10 mg by mouth at bedtime.     Cholecalciferol (DIALYVITE VITAMIN D 5000) 125 MCG (5000 UT) capsule Take 5,000 Units by mouth daily.     Continuous Blood Gluc Receiver (FREESTYLE LIBRE 2 READER) DEVI Use to check blood sugars daily 1 each 0   DULoxetine (CYMBALTA) 20 MG capsule Take 1 capsule (20 mg total) by mouth daily. 30 capsule 3   exemestane (AROMASIN) 25 MG tablet TAKE ONE TABLET BY MOUTH DAILY AFTER BREAKFAST 90 tablet 3   ezetimibe (ZETIA) 10 MG tablet Take 1 tablet (10 mg total) by mouth daily. 90 tablet 3   famotidine (PEPCID) 20 MG tablet TAKE ONE TABLET BY MOUTH TWICE DAILY. ONE AFTER SUPPER 60 tablet 11   furosemide (LASIX) 20 MG tablet Take 20 mg by mouth daily as needed for fluid.     insulin glargine (LANTUS) 100 UNIT/ML injection Inject 24 Units into the skin daily.     insulin lispro (HUMALOG) 100 UNIT/ML injection Inject 14 Units into the skin 3 (three) times daily with meals. Up to 14 units according to blood sugar     Insulin Pen Needle (BD PEN NEEDLE NANO  U/F) 32G X 4 MM MISC USE AS INSTRUCTED TO INJECT INSULIN 4 TIMES DAILY. 360 each 1   metoprolol succinate (TOPROL-XL) 25 MG 24 hr tablet TAKE ONE-HALF TABLET BY MOUTH EVERY DAY 45 tablet 3   nitroGLYCERIN (NITROSTAT) 0.4 MG SL tablet Place 1 tablet (0.4 mg total) under the tongue every 5 (five) minutes as needed for chest pain. 25 tablet 3   ondansetron (ZOFRAN ODT) 4 MG disintegrating tablet Take 1 tablet (4 mg total) by mouth every 8 (eight) hours as needed. 10 tablet 0   rosuvastatin (CRESTOR) 40 MG tablet Take 1 tablet (40 mg total) by mouth at bedtime. 90 tablet 0   SYMBICORT 160-4.5 MCG/ACT inhaler INHALE TWO PUFFS INTO THE LUNGS TWICE DAILY 23.3 g 5   TRULICITY 1.5 AQ/7.6AU SOPN INJECT THE CONTENTS OF ONE  PEN SUBCUTANEOUSLY WEEKLY  AS DIRECTED 6 mL 3   vitamin B-12 (CYANOCOBALAMIN) 100 MCG tablet Take 100 mcg by mouth daily.     zolpidem (AMBIEN) 5 MG tablet Take 7.5 mg by mouth at bedtime as needed for sleep.     No current facility-administered medications for this encounter.    BP (!) 142/60   Pulse 65   Wt 64.7 kg (142 lb 9.6 oz)   LMP  (LMP Unknown)   SpO2 99%   BMI 24.48 kg/m  General: NAD Neck: No JVD, no thyromegaly or thyroid nodule.  Lungs: Clear to auscultation bilaterally with normal respiratory effort. CV: Nondisplaced PMI.  Heart regular S1/S2, no S3/S4, 2/6 early SEM RUSB.  No peripheral edema.  No carotid bruit.  Normal pedal pulses.  Abdomen: Soft, nontender, no hepatosplenomegaly, no distention.  Skin: Intact without lesions or rashes.  Neurologic: Alert and oriented x 3.  Psych: Normal affect. Extremities: No clubbing or cyanosis.  HEENT: Normal.   Assessment/Plan: 1. CAD: Admission in 9/14 with mild troponin elevation.  LHC showed stable anatomy with patent grafts except for SVG-RCA from older CABG.  Suspect that the troponin elevation was due to demand ischemia in the setting of hypertensive emergency and tachycardia (possible run of atrial tachycardia).   Admission in 1/15 with atypical chest pain, had Lexiscan Cardiolite that showed no ischemia.  Cardiolite in 11/21 with no ischemia/infarction.  Given normal Cardiolite and atypical nature  of pain, possible microvascular angina. No change to atypical pain pattern.  - No ASA given Eliquis use.  - Continue Crestor 40 mg daily, good lipids in 10/22.   - Can continue low dose Toprol XL 12.5 mg daily.      2. Atrial fibrillation: She is on amiodarone 200 daily and apixaban (dose reduction of apixaban given renal function and weight).  - Check LFTs, recent TSH normal.  She will need a regular eye exam with amiodarone.   3. HYPERLIPIDEMIA: Good lipids in 10/22.  4. Bioprosthetic aortic valve: Stable on 4/22 echo.   5. Chronic diastolic CHF: Echo in 16/10 with EF 55-60% with mild LVH.  PYP scan was not suggestive of TTR cardiac amyloidosis.  She looks euvolemic today, NYHA class II.  - Continue to take Lasix prn.  6. CKD: Stage III.  - BMET today.  7. Syncope/presyncope: Tachy-brady syndrome (atrial fibrillation and sinus bradycardia), also orthostatic hypotension possibly due to autonomic neuropathy.  She now has Biotronik PPM. She is off midodrine.   Followup in 6 months.   Loralie Champagne 04/21/2021

## 2021-04-28 ENCOUNTER — Encounter: Payer: Self-pay | Admitting: Endocrinology

## 2021-04-28 ENCOUNTER — Ambulatory Visit (INDEPENDENT_AMBULATORY_CARE_PROVIDER_SITE_OTHER): Payer: Medicare Other | Admitting: Endocrinology

## 2021-04-28 ENCOUNTER — Other Ambulatory Visit: Payer: Self-pay

## 2021-04-28 VITALS — BP 138/64 | HR 74 | Ht 64.5 in | Wt 140.0 lb

## 2021-04-28 DIAGNOSIS — E1165 Type 2 diabetes mellitus with hyperglycemia: Secondary | ICD-10-CM | POA: Diagnosis not present

## 2021-04-28 DIAGNOSIS — Z794 Long term (current) use of insulin: Secondary | ICD-10-CM

## 2021-04-28 DIAGNOSIS — N289 Disorder of kidney and ureter, unspecified: Secondary | ICD-10-CM | POA: Diagnosis not present

## 2021-04-28 DIAGNOSIS — Z23 Encounter for immunization: Secondary | ICD-10-CM | POA: Diagnosis not present

## 2021-04-28 MED ORDER — LYUMJEV 100 UNIT/ML IJ SOLN: INTRAMUSCULAR | 1 refills | Status: DC

## 2021-04-28 NOTE — Patient Instructions (Addendum)
Average 16 at lunch, 18 for more carbs Take shot right before meal  New insulin is [id number]

## 2021-04-28 NOTE — Progress Notes (Addendum)
Patient ID: Hannah [name], female   DOB: 26-Nov-1945, 74 y.o.   MRN: [id number]           Reason for Appointment: Follow-up for Type 2 Diabetes   History of Present Illness:          Date of diagnosis of type 2 diabetes mellitus: Late 1980s        Background history:  She was previously treated with metformin prior to starting insulin She thinks she has been on insulin for at least 10 years and mostly taking Lantus and other types of insulin with it Her metformin was stopped when she started insulin and also has had renal dysfunction Previously her A1c had been consistently over 8% and occasionally higher.  She had been on the V-go pump since 03/2015, this was stopped in 3/20  Recent history:   INSULIN regimen is described as:  Lantus 24 units in am, Humalog 8-10 units before meals 12-14 acl   Non-insulin hypoglycemic drugs the patient is taking are: Trulicity 1.5 mg weekly   Current diabetes management, blood sugar patterns and problems identified:  Her A1c is usually lower than expected, now 6.6 and higher; she also has significant anemia and renal problems  She was able to continue the freestyle libre and this was downloaded However she is not checking blood sugars frequently enough to get a complete set of data especially overnight readings and after dinner She now says that she is taking her Humalog AFTER eating and may sometimes be late in taking this This may occasionally cause low sugars before the next meal but most often she tends to have high readings AFTER her midday meal She also thinks she gets low sugars overnight if she does not keep her blood sugar over 160 at bedtime but currently no tendency to blood sugars below 100 overnight She is sometimes eating a larger amount of carbohydrate with her lunch meal and not adjusting the dose enough  Highest blood sugar was over 350 when she forgot her Humalog when she was eating out Her weight is about the same She has  taken her Trulicity regularly Only has had occasional low normal readings at 6 PM Compared to her last visit her blood sugars are averaging lower but still about the same time in target  Dinner 6-7 PM usually  Side effects from medications have been: None  Compliance with the medical regimen: Fair  Data from last 2 weeks Freestyle libre version 2 was interpreted as follows   Blood sugar data is fairly complete on most of the days Accuracy of the data appears to be fairly good with lab glucose only slightly higher than the sensor glucose at the same time Overnight blood sugars are generally a little high at midnight but progressively declining until about early morning when they are more stable without hypoglycemia  Fasting blood sugar do not appear to be below 100 early morning  Blood sugars are generally rising progressively in the mornings until about mid afternoon  POSTPRANDIAL readings are fairly well controlled after breakfast with minimal rise However postprandial readings are significantly high after lunch in the mid afternoon with blood sugars frequently going up over 200  Postprandial readings after evening meals are high about half the time but not consistently  Hypoglycemia is minimal with only occasional low normal readings in the 60s before dinner at 6 PM and once at 9 PM    Freestyle libre data  CGM use % of time 82  2-week average/GV 160/34  Time in range       72 %  % Time Above 180 21+7  % Time above 250   % Time Below 70 0     PRE-MEAL Fasting Lunch Dinner Bedtime Overall  Glucose range:       Averages: 123  156 189 160   POST-MEAL PC Breakfast PC Lunch PC Dinner  Glucose range:     Averages: 112 212 178   PREVIOUSLY   CGM use % of time 89  2-week average/GV 155/32  Time in range       72%  % Time Above 180 23+5  % Time above 250   % Time Below 70 0      Self-care: The diet that the patient has been following is: tries to limit fats and carbs.   She uses diet green tea or diet drinks and no sugar in drinks    Lunch 1-2 pm Dinner 6-7 PM  Typical meal intake: Breakfast is oatrmeal or cheese toast   if she has any            Dietician visit, most recent: 5/16 And in 7/16 with oncology dietitian    Weight history:  Wt Readings from Last 3 Encounters:  04/28/21 140 lb (63.5 kg)  04/20/21 142 lb 9.6 oz (64.7 kg)  04/04/21 141 lb 9.6 oz (64.2 kg)    Glycemic control:      Lab Results  Component Value Date   HGBA1C 6.6 (H) 04/20/2021   HGBA1C 6.2 01/25/2021   HGBA1C 7.3 (H) 10/21/2020   Lab Results  Component Value Date   MICROALBUR 16.8 (H) 10/21/2020   LDLCALC 46 03/23/2021   CREATININE 2.29 (H) 04/20/2021   Lab Results  Component Value Date   FRUCTOSAMINE 241 12/11/2019   FRUCTOSAMINE 276 07/02/2019   FRUCTOSAMINE 276 10/11/2017    Lab Results  Component Value Date   HGB 9.4 (L) 04/20/2021       Allergies as of 04/28/2021       Reactions   Amoxicillin Rash, Other (See Comments)   Tolerates Cephalosporins Has patient had a PCN reaction causing immediate rash, facial/tongue/throat swelling, SOB or lightheadedness with hypotension: Yes Has patient had a PCN reaction causing severe rash involving mucus membranes or skin necrosis: Yes Has patient had a PCN reaction that required hospitalization No Has patient had a PCN reaction occurring within the last 10 years: No If all of the above answers are "NO", then may proceed with Cephalosporin use.   Tape Other (See Comments)   Pulls off skin, must use paper tape   Aldactone [spironolactone] Other (See Comments)   CKD/hypokalemia   Isosorbide Nitrate    Other reaction(s): fainting   K-flex [orphenadrine]    Other reaction(s): stomach pain   Arimidex [anastrozole] Nausea Only   Latex Itching, Other (See Comments)   (Dentist office)   Tetracycline Rash        Medication List        Accurate as of April 28, 2021  4:22 PM. If you have any  questions, ask your nurse or doctor.          STOP taking these medications    insulin lispro 100 UNIT/ML injection Commonly known as: HUMALOG Stopped by: Elayne Snare, MD       TAKE these medications    Accu-Chek FastClix Lancets Misc Use to check blood sugar 3 times per day   Accu-Chek Guide Me w/Device Kit Use accu  chek meter to check blood sugar three times daily.   Accu-Chek Guide test strip Generic drug: glucose blood Use to test blood sugar 3 times daily   acetaminophen 500 MG tablet Commonly known as: TYLENOL Take 1,000 mg by mouth as needed for moderate pain.   albuterol (2.5 MG/3ML) 0.083% nebulizer solution Commonly known as: PROVENTIL Take 3 mLs (2.5 mg total) by nebulization every 6 (six) hours as needed for wheezing or shortness of breath.   albuterol 108 (90 Base) MCG/ACT inhaler Commonly known as: VENTOLIN HFA Inhale 2 puffs into the lungs every 6 (six) hours as needed for wheezing or shortness of breath.   amiodarone 200 MG tablet Commonly known as: PACERONE TAKE ONE TABLET BY MOUTH DAILY   apixaban 2.5 MG Tabs tablet Commonly known as: Eliquis Take 1 tablet (2.5 mg total) by mouth 2 (two) times daily.   BD Pen Needle Nano U/F 32G X 4 MM Misc Generic drug: Insulin Pen Needle USE AS INSTRUCTED TO INJECT INSULIN 4 TIMES DAILY.   cetirizine 10 MG tablet Commonly known as: ZYRTEC Take 10 mg by mouth at bedtime.   Dialyvite Vitamin D 5000 125 MCG (5000 UT) capsule Generic drug: Cholecalciferol Take 5,000 Units by mouth daily.   DULoxetine 20 MG capsule Commonly known as: CYMBALTA Take 1 capsule (20 mg total) by mouth daily.   exemestane 25 MG tablet Commonly known as: AROMASIN TAKE ONE TABLET BY MOUTH DAILY AFTER BREAKFAST   ezetimibe 10 MG tablet Commonly known as: ZETIA Take 1 tablet (10 mg total) by mouth daily.   famotidine 20 MG tablet Commonly known as: PEPCID TAKE ONE TABLET BY MOUTH TWICE DAILY. ONE AFTER SUPPER   FreeStyle  Libre 2 Reader Kerrin Mo Use to check blood sugars daily   furosemide 20 MG tablet Commonly known as: LASIX Take 20 mg by mouth daily as needed for fluid.   insulin glargine 100 UNIT/ML injection Commonly known as: LANTUS Inject 24 Units into the skin daily.   Lyumjev 100 UNIT/ML Soln Generic drug: Insulin Lispro-aabc Inject 10 units before breakfast, 18 before lunch and 14 before dinner.  Replaces Humalog Started by: Elayne Snare, MD   metoprolol succinate 25 MG 24 hr tablet Commonly known as: TOPROL-XL TAKE ONE-HALF TABLET BY MOUTH EVERY DAY   nitroGLYCERIN 0.4 MG SL tablet Commonly known as: NITROSTAT Place 1 tablet (0.4 mg total) under the tongue every 5 (five) minutes as needed for chest pain.   ondansetron 4 MG disintegrating tablet Commonly known as: Zofran ODT Take 1 tablet (4 mg total) by mouth every 8 (eight) hours as needed.   rosuvastatin 40 MG tablet Commonly known as: CRESTOR Take 1 tablet (40 mg total) by mouth at bedtime.   Symbicort 160-4.5 MCG/ACT inhaler Generic drug: budesonide-formoterol INHALE TWO PUFFS INTO THE LUNGS TWICE DAILY   Trulicity 1.5 DT/2.6ZT Sopn Generic drug: Dulaglutide INJECT THE CONTENTS OF ONE  PEN SUBCUTANEOUSLY WEEKLY  AS DIRECTED   vitamin B-12 100 MCG tablet Commonly known as: CYANOCOBALAMIN Take 100 mcg by mouth daily.   zolpidem 5 MG tablet Commonly known as: AMBIEN Take 7.5 mg by mouth at bedtime as needed for sleep.        Allergies:  Allergies  Allergen Reactions   Amoxicillin Rash and Other (See Comments)    Tolerates Cephalosporins Has patient had a PCN reaction causing immediate rash, facial/tongue/throat swelling, SOB or lightheadedness with hypotension: Yes Has patient had a PCN reaction causing severe rash involving mucus membranes or skin necrosis: Yes  Has patient had a PCN reaction that required hospitalization No Has patient had a PCN reaction occurring within the last 10 years: No If all of the above  answers are "NO", then may proceed with Cephalosporin use.    Tape Other (See Comments)    Pulls off skin, must use paper tape   Aldactone [Spironolactone] Other (See Comments)    CKD/hypokalemia   Isosorbide Nitrate     Other reaction(s): fainting   K-Flex [Orphenadrine]     Other reaction(s): stomach pain   Arimidex [Anastrozole] Nausea Only   Latex Itching and Other (See Comments)    (Dentist office)   Tetracycline Rash    Past Medical History:  Diagnosis Date   Abnormally small mouth    Allergic rhinitis 10/14/2009   Qualifier: Diagnosis of  By: Lamonte Sakai MD, Rose Fillers   Overview:  Overview:  Qualifier: Diagnosis of  By: Lamonte Sakai MDRose Fillers  Last Assessment & Plan:  Please continue Xyzal and Nasacort as you have been using them   Anemia    Asthma 05/12/2009   10/12/2014 p extensive coaching HFA effectiveness =    75% s spacer    Overview:  Overview:  10/12/2014 p extensive coaching HFA effectiveness =    75% s spacer   Last Assessment & Plan:  Please continue Symbicort 2 puffs twice a day. Remember to rinse and gargle after taking this medication. Take albuterol 2 puffs up to every 4 hours if needed for shortness of breath.  Follow with Dr Lamonte Sakai in 6 month   Bilateral carotid artery stenosis    a. mild 1-39% by duplex 10/2017, due 2021.   Breast cancer ([place]) 01/18/2016   right breast   CAD (coronary artery disease) 01/24/2011   a. s/p CABG 1998 with redo 2009.   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   Chemotherapy-induced thrombocytopenia 04/06/2016   Chronic diastolic CHF (congestive heart failure) ([place]) 09/24/2013   CKD (chronic kidney disease), stage IV ([place]) 09/24/2013   Closed fracture of head of left humerus 24/02/7352   Complication of anesthesia    Dysrhythmia    a-fib   Gallstones    GERD (gastroesophageal reflux disease)    Glaucoma    H/O atrial tachycardia 05/12/2009   Qualifier: History of   By: Lamonte Sakai MD, Rose Fillers   Overview:  Overview:  Qualifier: History of  By: Lamonte Sakai MD, Rose Fillers  Last Assessment & Plan:  There was no evidence of this on her cardiac monitor. I would recommend watchful waiting.    Heart murmur    History of COVID-19 07/2019   ASYMPTOMATIC   History of kidney stones    History of non-ST elevation myocardial infarction (NSTEMI)    Sept 2014--  thought to be type II HTN w/ LHC without infarct related artery and patent grafts   History of radiation therapy 05/24/16-07/26/16   right breast 50.4 Gy in 28 fractions, right breast boost 10 Gy in 5 fractions   Hyperlipidemia    Hypotension    Iron deficiency anemia    Mania ([place]) 03/05/2016   Moderate persistent asthma    pulmologist-  Dr. Malvin Johns   Osteomyelitis of toe of left foot ([place]) 04/06/2016   Osteopenia of multiple sites 10/19/2015   PAF (paroxysmal atrial fibrillation) ([place])    Personal history of chemotherapy 2017   Personal history of radiation therapy 2017   Pleural effusion on right 05/2019   RESOLVED   PONV (  postoperative nausea and vomiting)    Psoriasis    right leg   Renal calculus, right    S/P AVR    prosthesis valve placement 2009 at same time re-do CABG   Seizure-like activity ([place]) 06/29/2016   EEG NORMAL, HEAD CT WITHOUT ACUTE FINDINGS   Sensorineural hearing loss (SNHL) of both ears 01/06/2016   Stroke ([place]) 2013   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   Syncope 09/06/2017   Type 2 diabetes mellitus ([place])    monitored by dr Dwyane Dee   Uses roller walker     Past Surgical History:  Procedure Laterality Date   AORTIC VALVE [facility]ACEMENT  [facility]   [facility] same [facility] is [facility]   BREAST BIOPSY Right 02/12/2019   2 lymphnodes, 1 breast   BREAST LUMPECTOMY Right 01/18/2016   BREAST LUMPECTOMY WITH RADIOACTIVE SEED AND SENTINEL LYMPH NODE BIOPSY Right 01/18/2016   Procedure: RIGHT BREAST LUMPECTOMY WITH RADIOACTIVE SEED AND SENTINEL LYMPH NODE  BIOPSY;  Surgeon: DAlphonsa Overall MD;  [facility]  Service: General;  Laterality: Right;   CARDIAC CATHETERIZATION  03/23/2008   Pre-redo CABG: L main OK, LAD (T), CFX (T), OM1 99%, RCA (T), LIMA-LAD OK, SVG-OM(?3) OK w/ little florw to OM2, SVG-RCA OK. EF NL   CARPAL TUNNEL RELEASE     CHEST TUBE INSERTION Right 06/26/2019   Procedure: INSERTION PLEURAL DRAINAGE CATHETER;  Surgeon: BGaye Pollack MD;  [facility];  Service: Thoracic;  Laterality: Right;   CHOLECYSTECTOMY N/A 07/07/2018   Procedure: LAPAROSCOPIC CHOLECYSTECTOMY WITH INTRAOPERATIVE CHOLANGIOGRAM ERAS PATHWAY;  Surgeon: NAlphonsa Overall MD;  [facility];  Service: General;  Laterality: N/A;   COLONOSCOPY     around 2015. Possibly with [place]&  re-do 2009   Had LIMA to DX/LAD, SVG [facility] 2 marginal branches [facility]; SVG to 3rd OM and PD at time of redo   CYSTOSCOPY W/ URETERAL STENT PLACEMENT Right 12/20/2014   Procedure: CYSTOSCOPY WITH RETROGRADE PYELOGRAM/URETERAL STENT PLACEMENT;  Surgeon: PCleon Gustin MD;  [facility]  Service: Urology;  Laterality: Right;   CYSTOSCOPY WITH RETROGRADE PYELOGRAM, URETEROSCOPY AND STENT PLACEMENT Left 08/21/2019   Procedure: CYSTOSCOPY WITH RETROGRADE PYELOGRAM, URETEROSCOPY AND STENT PLACEMENT;  Surgeon: MCleon Gustin MD;  [facility];  Service: Urology;  Laterality: Left;  1 HR   CYSTOSCOPY/URETEROSCOPY/HOLMIUM LASER/STENT PLACEMENT Right 04/04/2021   Procedure: CYSTOSCOPY/URETEROSCOPY/HOLMIUM LASER/STONE EXTRACTION AND STENT PLACEMENT;  Surgeon: NRemi Haggard MD;  [facility]  Service: Urology;  Laterality: Right;  1 HR   ESOPHAGOGASTRODUODENOSCOPY     many years ago per patient    ESOPHAGOGASTRODUODENOSCOPY ENDOSCOPY  06/17/2018   EYE SURGERY Bilateral    cataracts   HOLMIUM LASER APPLICATION Right 035/57/3220  Procedure:  HOLMIUM LASER LITHOTRIPSY;   Surgeon: PCleon Gustin MD;  [facility]  Service: Urology;  Laterality: Right;   HOLMIUM LASER APPLICATION Left 025/42/7062  Procedure: HOLMIUM LASER APPLICATION;  Surgeon: MCleon Gustin MD;  [facility];  Service: Urology;  Laterality: Left;   IR KYPHO LUMBAR INC FX REDUCE BONE BX UNI/BIL CANNULATION INC/IMAGING  02/12/2020   IR THORACENTESIS ASP PLEURAL SPACE W/IMG GUIDE  10/03/2018   IR THORACENTESIS ASP PLEURAL SPACE W/IMG GUIDE  06/08/2019   LEFT HEART CATHETERIZATION WITH CORONARY/GRAFT ANGIOGRAM N/A 02/23/2013   Procedure: LEFT HEART CATHETERIZATION  WITH Beatrix Fetters;  Surgeon: Blane Ohara, MD;  [facility];  Service: Cardiovascular;  Laterality: N/A;   LOOP RECORDER INSERTION N/A 08/30/2017   Procedure: LOOP RECORDER INSERTION;  Surgeon: Evans Lance, MD;  Location: [place] CV LAB;  Service: Cardiovascular;  Laterality: N/A;   LOOP RECORDER REMOVAL N/A 08/10/2020   Procedure: LOOP RECORDER REMOVAL;  Surgeon: Evans Lance, MD;  Location: [place] [facility];  Service: Cardiovascular;  Laterality: N/A;   PACEMAKER IMPLANT N/A 08/10/2020   Procedure: PACEMAKER IMPLANT;  Surgeon: Evans Lance, MD;  Location: [place] [facility];  Service: Cardiovascular;  Laterality: N/A;   PORTACATH PLACEMENT Left 01/18/2016   Procedure: INSERTION PORT-A-CATH;  Surgeon: Alphonsa Overall, MD;  Location: [place];  Service: General;  Laterality: Left;   portacath removal  06/2016   REMOVAL OF PLEURAL DRAINAGE CATHETER Right 07/14/2019   Procedure: REMOVAL OF PLEURAL DRAINAGE CATHETER;  Surgeon: Gaye Pollack, MD;  Location: [place];  Service: Thoracic;  Laterality: Right;   TALC PLEURODESIS N/A 06/26/2019   Procedure: Pietro Cassis;  Surgeon: Gaye Pollack, MD;  [facility];  Service: Thoracic;  Laterality: N/A;   TONSILLECTOMY     TRANSTHORACIC ECHOCARDIOGRAM  02/24/2013   mild LVH,  ef 50-55%/  AV bioprosthesis was present  with very mild stenosis and no regurg., mean grandient 59mHg, peak grandient 280mg /  mild MR/  mild LAE and RAE/  moderate TR   TUBAL LIGATION  1983    Family History  Problem Relation Age of Onset   Heart disease Father    Heart failure Father    Diabetes Maternal Grandmother    Heart disease Maternal Grandmother    Diabetes Son    Healthy Brother        #1   Heart attack Brother        #2   Heart disease Brother        #2   Colon cancer Neg Hx    Esophageal cancer Neg Hx     Social History:  reports that she has never smoked. She has never used smokeless tobacco. She reports that she does not drink alcohol and does not use drugs.    Review of Systems    Lipid history: On 40 mg Crestor along with Zetia prescribed by cardiology     Lab Results  Component Value Date   CHOL 99 03/23/2021   HDL 38 (L) 03/23/2021   LDLCALC 46 03/23/2021   LDLDIRECT 45.0 01/25/2021   TRIG 77 03/23/2021   CHOLHDL 2.6 03/23/2021           Eyes:  No history of retinopathy  She is followed by nephrologist for renal dysfunction, was recommended increasing her protein intake   Lab Results  Component Value Date   CREATININE 2.29 (H) 04/20/2021   CREATININE 2.40 (H) 04/04/2021   CREATININE 2.54 (H) 03/23/2021    Diabetic foot exam in 3/20: She has significant loss of monofilament sensation in her distal feet She thinks her neuropathy started after her chemotherapy She has difficulty with balance and uses a walker at times She has significant numbness but no pain or paresthesia  HYPERTENSION: She has been only on low-dose metoprolol  BP Readings from Last 3 Encounters:  04/28/21 138/64  04/20/21 (!) 142/60  04/14/21 (!) 121/51   She is on amiodarone 200 mg, thyroid levels have been normal  Lab Results  Component Value Date   TSH 4.265 03/23/2021  Physical Examination:  BP 138/64   Pulse 74   Ht 5' 4.5" (1.638 m)   Wt 140 lb (63.5 kg)   LMP  (LMP Unknown)   SpO2  99%   BMI 23.66 kg/m     ASSESSMENT:  Diabetes type 2, on insulin    See history of present illness for detailed discussion of  current management, blood sugar patterns and problems identified  Blood sugar prior readings, readings and averages reviewed from her meter and compared to previous data  A1c is 6.6 and likely falsely low from chronic anemia Her GMI cannot be assessed because of inadequate monitoring on her CGM  She is on basal bolus insulin injections and Trulicity  She  is still requiring relatively larger doses of mealtime insulin compared to basal especially at lunchtime for some reason With 24 units of Lantus her fasting readings are fairly good  Freestyle data as above  She can continue the same dose of Lantus Also continue Trulicity to minimize postprandial hyperglycemia and weight gain  Hypertension: Mild and well-controlled  Renal failure: She has relatively stable CKD right now  PLAN:    Today discussed in detail the timing of her mealtime insulin, onset of action of Humalog insulin, need to control postprandial blood sugar spikes with her largest meal and adjustment based on portions and carbohydrates Since she is more insulin resistant around lunchtime she will take up to 18 units Humalog at lunchtime, likely needs an average of 16 units instead of 12-14 If she is just having a half sandwich she can continue to take about 12 units She will try to take her insulin when she is preparing her food 5 to 15-minute before eating Have protein consistently at lunchtime She can continue about the same dose for breakfast and suppertime She will also take Humalog with her when eating out and this will be okay to keep at room temperature No change in Lantus but if she has lower readings overnight can reduce the dose Needs to check blood sugars more consistently at bedtime to help better analysis Rotate injection sites Change Humalog to LYUMJEV on the next visit and  discussed the differences doing this and Humalog  Continue 1.5 mg Trulicity   Patient Instructions  Average 16 at lunch, 18 for more carbs Take shot right before meal  New insulin is LYUMJEV   Total visit time including counseling = 30 minutes   Elayne Snare 04/28/2021, 4:22 PM   Note: This office note was prepared with Dragon voice recognition system technology. Any transcriptional errors that result from this process are unintentional.

## 2021-05-06 ENCOUNTER — Emergency Department (HOSPITAL_COMMUNITY): Payer: Medicare Other

## 2021-05-06 ENCOUNTER — Other Ambulatory Visit: Payer: Self-pay

## 2021-05-06 ENCOUNTER — Inpatient Hospital Stay (HOSPITAL_COMMUNITY)
Admission: EM | Admit: 2021-05-06 | Discharge: 2021-05-12 | DRG: 481 | Disposition: A | Discharge status: Skilled Nursing Facility | Payer: Medicare Other | Attending: Internal Medicine | Admitting: Internal Medicine

## 2021-05-06 ENCOUNTER — Encounter (HOSPITAL_COMMUNITY): Payer: Self-pay | Admitting: Internal Medicine

## 2021-05-06 DIAGNOSIS — Z794 Long term (current) use of insulin: Secondary | ICD-10-CM

## 2021-05-06 DIAGNOSIS — W19XXXA Unspecified fall, initial encounter: Secondary | ICD-10-CM

## 2021-05-06 DIAGNOSIS — K219 Gastro-esophageal reflux disease without esophagitis: Secondary | ICD-10-CM | POA: Diagnosis present

## 2021-05-06 DIAGNOSIS — J301 Allergic rhinitis due to pollen: Secondary | ICD-10-CM

## 2021-05-06 DIAGNOSIS — I130 Hypertensive heart and chronic kidney disease with heart failure and stage 1 through stage 4 chronic kidney disease, or unspecified chronic kidney disease: Secondary | ICD-10-CM | POA: Diagnosis present

## 2021-05-06 DIAGNOSIS — W1830XA Fall on same level, unspecified, initial encounter: Secondary | ICD-10-CM | POA: Diagnosis present

## 2021-05-06 DIAGNOSIS — Z833 Family history of diabetes mellitus: Secondary | ICD-10-CM | POA: Diagnosis not present

## 2021-05-06 DIAGNOSIS — Z923 Personal history of irradiation: Secondary | ICD-10-CM

## 2021-05-06 DIAGNOSIS — I252 Old myocardial infarction: Secondary | ICD-10-CM | POA: Diagnosis not present

## 2021-05-06 DIAGNOSIS — M25551 Pain in right hip: Secondary | ICD-10-CM | POA: Diagnosis not present

## 2021-05-06 DIAGNOSIS — Z881 Allergy status to other antibiotic agents status: Secondary | ICD-10-CM

## 2021-05-06 DIAGNOSIS — I2510 Atherosclerotic heart disease of native coronary artery without angina pectoris: Secondary | ICD-10-CM | POA: Diagnosis present

## 2021-05-06 DIAGNOSIS — S72001A Fracture of unspecified part of neck of right femur, initial encounter for closed fracture: Secondary | ICD-10-CM | POA: Diagnosis present

## 2021-05-06 DIAGNOSIS — Z888 Allergy status to other drugs, medicaments and biological substances status: Secondary | ICD-10-CM

## 2021-05-06 DIAGNOSIS — S72141A Displaced intertrochanteric fracture of right femur, initial encounter for closed fracture: Secondary | ICD-10-CM | POA: Diagnosis present

## 2021-05-06 DIAGNOSIS — E785 Hyperlipidemia, unspecified: Secondary | ICD-10-CM | POA: Diagnosis present

## 2021-05-06 DIAGNOSIS — I5032 Chronic diastolic (congestive) heart failure: Secondary | ICD-10-CM | POA: Diagnosis present

## 2021-05-06 DIAGNOSIS — Z7951 Long term (current) use of inhaled steroids: Secondary | ICD-10-CM

## 2021-05-06 DIAGNOSIS — E875 Hyperkalemia: Secondary | ICD-10-CM | POA: Diagnosis present

## 2021-05-06 DIAGNOSIS — Z79811 Long term (current) use of aromatase inhibitors: Secondary | ICD-10-CM

## 2021-05-06 DIAGNOSIS — I4819 Other persistent atrial fibrillation: Secondary | ICD-10-CM | POA: Diagnosis present

## 2021-05-06 DIAGNOSIS — Z7901 Long term (current) use of anticoagulants: Secondary | ICD-10-CM

## 2021-05-06 DIAGNOSIS — Z853 Personal history of malignant neoplasm of breast: Secondary | ICD-10-CM | POA: Diagnosis not present

## 2021-05-06 DIAGNOSIS — Z7985 Long-term (current) use of injectable non-insulin antidiabetic drugs: Secondary | ICD-10-CM

## 2021-05-06 DIAGNOSIS — E1165 Type 2 diabetes mellitus with hyperglycemia: Secondary | ICD-10-CM | POA: Diagnosis present

## 2021-05-06 DIAGNOSIS — Z9221 Personal history of antineoplastic chemotherapy: Secondary | ICD-10-CM

## 2021-05-06 DIAGNOSIS — Y92000 Kitchen of unspecified non-institutional (private) residence as  the place of occurrence of the external cause: Secondary | ICD-10-CM | POA: Diagnosis not present

## 2021-05-06 DIAGNOSIS — J309 Allergic rhinitis, unspecified: Secondary | ICD-10-CM | POA: Diagnosis present

## 2021-05-06 DIAGNOSIS — D62 Acute posthemorrhagic anemia: Secondary | ICD-10-CM | POA: Diagnosis present

## 2021-05-06 DIAGNOSIS — S72144A Nondisplaced intertrochanteric fracture of right femur, initial encounter for closed fracture: Secondary | ICD-10-CM | POA: Diagnosis not present

## 2021-05-06 DIAGNOSIS — Z95810 Presence of automatic (implantable) cardiac defibrillator: Secondary | ICD-10-CM

## 2021-05-06 DIAGNOSIS — Z951 Presence of aortocoronary bypass graft: Secondary | ICD-10-CM | POA: Diagnosis not present

## 2021-05-06 DIAGNOSIS — N179 Acute kidney failure, unspecified: Secondary | ICD-10-CM | POA: Diagnosis present

## 2021-05-06 DIAGNOSIS — D631 Anemia in chronic kidney disease: Secondary | ICD-10-CM | POA: Diagnosis present

## 2021-05-06 DIAGNOSIS — E1122 Type 2 diabetes mellitus with diabetic chronic kidney disease: Secondary | ICD-10-CM | POA: Diagnosis present

## 2021-05-06 DIAGNOSIS — Y92009 Unspecified place in unspecified non-institutional (private) residence as the place of occurrence of the external cause: Secondary | ICD-10-CM

## 2021-05-06 DIAGNOSIS — J45909 Unspecified asthma, uncomplicated: Secondary | ICD-10-CM | POA: Diagnosis present

## 2021-05-06 DIAGNOSIS — I480 Paroxysmal atrial fibrillation: Secondary | ICD-10-CM | POA: Diagnosis not present

## 2021-05-06 DIAGNOSIS — J4540 Moderate persistent asthma, uncomplicated: Secondary | ICD-10-CM

## 2021-05-06 DIAGNOSIS — Z8616 Personal history of COVID-19: Secondary | ICD-10-CM | POA: Diagnosis not present

## 2021-05-06 DIAGNOSIS — Z8249 Family history of ischemic heart disease and other diseases of the circulatory system: Secondary | ICD-10-CM

## 2021-05-06 DIAGNOSIS — Z419 Encounter for procedure for purposes other than remedying health state, unspecified: Secondary | ICD-10-CM

## 2021-05-06 DIAGNOSIS — N184 Chronic kidney disease, stage 4 (severe): Secondary | ICD-10-CM | POA: Diagnosis present

## 2021-05-06 DIAGNOSIS — Z952 Presence of prosthetic heart valve: Secondary | ICD-10-CM

## 2021-05-06 DIAGNOSIS — E119 Type 2 diabetes mellitus without complications: Secondary | ICD-10-CM

## 2021-05-06 DIAGNOSIS — Z880 Allergy status to penicillin: Secondary | ICD-10-CM

## 2021-05-06 DIAGNOSIS — Z79899 Other long term (current) drug therapy: Secondary | ICD-10-CM

## 2021-05-06 DIAGNOSIS — Z91040 Latex allergy status: Secondary | ICD-10-CM

## 2021-05-06 DIAGNOSIS — I495 Sick sinus syndrome: Secondary | ICD-10-CM | POA: Diagnosis not present

## 2021-05-06 DIAGNOSIS — Z8673 Personal history of transient ischemic attack (TIA), and cerebral infarction without residual deficits: Secondary | ICD-10-CM

## 2021-05-06 LAB — CBC WITH DIFFERENTIAL/PLATELET
Abs Immature Granulocytes: 0.02 10*3/uL (ref 0.00–0.07)
Basophils Absolute: 0.1 10*3/uL (ref 0.0–0.1)
Basophils Relative: 1 %
Eosinophils Absolute: 0.2 10*3/uL (ref 0.0–0.5)
Eosinophils Relative: 3 %
HCT: 35.2 % — ABNORMAL LOW (ref 36.0–46.0)
Hemoglobin: 10.5 g/dL — ABNORMAL LOW (ref 12.0–15.0)
Immature Granulocytes: 0 %
Lymphocytes Relative: 12 %
Lymphs Abs: 0.7 10*3/uL (ref 0.7–4.0)
MCH: 28.8 pg (ref 26.0–34.0)
MCHC: 29.8 g/dL — ABNORMAL LOW (ref 30.0–36.0)
MCV: 96.4 fL (ref 80.0–100.0)
Monocytes Absolute: 0.6 10*3/uL (ref 0.1–1.0)
Monocytes Relative: 9 %
Neutro Abs: 4.6 10*3/uL (ref 1.7–7.7)
Neutrophils Relative %: 75 %
Platelets: 129 10*3/uL — ABNORMAL LOW (ref 150–400)
RBC: 3.65 MIL/uL — ABNORMAL LOW (ref 3.87–5.11)
RDW: 14.9 % (ref 11.5–15.5)
WBC: 6.2 10*3/uL (ref 4.0–10.5)
nRBC: 0 % (ref 0.0–0.2)

## 2021-05-06 LAB — URINALYSIS, ROUTINE W REFLEX MICROSCOPIC
Bilirubin Urine: NEGATIVE
Glucose, UA: >=500 mg/dL — AB
Ketones, ur: NEGATIVE mg/dL
Nitrite: NEGATIVE
Protein, ur: 100 mg/dL — AB
Specific Gravity, Urine: 1.014 (ref 1.005–1.030)
pH: 7 (ref 5.0–8.0)

## 2021-05-06 LAB — BASIC METABOLIC PANEL
Anion gap: 9 (ref 5–15)
BUN: 26 mg/dL — ABNORMAL HIGH (ref 8–23)
CO2: 24 mmol/L (ref 22–32)
Calcium: 9.3 mg/dL (ref 8.9–10.3)
Chloride: 102 mmol/L (ref 98–111)
Creatinine, Ser: 2.25 mg/dL — ABNORMAL HIGH (ref 0.44–1.00)
GFR, Estimated: 22 mL/min — ABNORMAL LOW (ref >60)
Glucose, Bld: 166 mg/dL — ABNORMAL HIGH (ref 70–99)
Potassium: 4.2 mmol/L (ref 3.5–5.1)
Sodium: 135 mmol/L (ref 135–145)

## 2021-05-06 LAB — RESP PANEL BY RT-PCR (FLU A&B, COVID) ARPGX2
Influenza A by PCR: NEGATIVE
Influenza B by PCR: NEGATIVE
SARS Coronavirus 2 by RT PCR: NEGATIVE

## 2021-05-06 LAB — MAGNESIUM: Magnesium: 2 mg/dL (ref 1.7–2.4)

## 2021-05-06 LAB — PROTIME-INR
INR: 1.3 — ABNORMAL HIGH (ref 0.8–1.2)
Prothrombin Time: 15.8 seconds — ABNORMAL HIGH (ref 11.4–15.2)

## 2021-05-06 MED ORDER — ROSUVASTATIN CALCIUM 20 MG PO TABS
40.0000 mg | ORAL_TABLET | Freq: Every day | ORAL | Status: DC
  Administered 2021-05-06 – 2021-05-11 (×6): 40 mg via ORAL
  Filled 2021-05-06 (×6): qty 2

## 2021-05-06 MED ORDER — ONDANSETRON HCL 4 MG/2ML IJ SOLN
4.0000 mg | Freq: Four times a day (QID) | INTRAMUSCULAR | Status: DC | PRN
  Administered 2021-05-06 – 2021-05-07 (×2): 4 mg via INTRAVENOUS
  Filled 2021-05-06 (×2): qty 2

## 2021-05-06 MED ORDER — HYDROMORPHONE HCL 1 MG/ML IJ SOLN
1.0000 mg | Freq: Once | INTRAMUSCULAR | Status: AC
  Administered 2021-05-06: 1 mg via INTRAVENOUS
  Filled 2021-05-06: qty 1

## 2021-05-06 MED ORDER — SODIUM CHLORIDE 0.9 % IV SOLN: INTRAVENOUS | Status: DC

## 2021-05-06 MED ORDER — MORPHINE SULFATE (PF) 4 MG/ML IV SOLN
4.0000 mg | INTRAVENOUS | Status: DC | PRN
  Administered 2021-05-06: 4 mg via INTRAVENOUS
  Filled 2021-05-06: qty 1

## 2021-05-06 MED ORDER — ACETAMINOPHEN 650 MG RE SUPP: 650.0000 mg | Freq: Four times a day (QID) | RECTAL | Status: DC | PRN

## 2021-05-06 MED ORDER — MELATONIN 3 MG PO TABS
3.0000 mg | ORAL_TABLET | Freq: Every evening | ORAL | Status: DC | PRN
  Filled 2021-05-06: qty 1

## 2021-05-06 MED ORDER — PANTOPRAZOLE SODIUM 40 MG IV SOLR
40.0000 mg | Freq: Two times a day (BID) | INTRAVENOUS | Status: DC
  Administered 2021-05-06: 40 mg via INTRAVENOUS
  Filled 2021-05-06: qty 40

## 2021-05-06 MED ORDER — ACETAMINOPHEN 325 MG PO TABS: 650.0000 mg | ORAL_TABLET | Freq: Four times a day (QID) | ORAL | Status: DC | PRN

## 2021-05-06 MED ORDER — MOMETASONE FURO-FORMOTEROL FUM 200-5 MCG/ACT IN AERO
2.0000 | INHALATION_SPRAY | Freq: Two times a day (BID) | RESPIRATORY_TRACT | Status: DC
  Administered 2021-05-06 – 2021-05-12 (×12): 2 via RESPIRATORY_TRACT
  Filled 2021-05-06: qty 8.8

## 2021-05-06 MED ORDER — ALBUTEROL SULFATE (2.5 MG/3ML) 0.083% IN NEBU: 2.5000 mg | INHALATION_SOLUTION | Freq: Four times a day (QID) | RESPIRATORY_TRACT | Status: DC | PRN

## 2021-05-06 MED ORDER — INSULIN ASPART 100 UNIT/ML IJ SOLN
0.0000 [IU] | Freq: Four times a day (QID) | INTRAMUSCULAR | Status: DC
  Administered 2021-05-07: 18:00:00 3 [IU] via SUBCUTANEOUS
  Administered 2021-05-07 (×2): 1 [IU] via SUBCUTANEOUS
  Administered 2021-05-08 (×3): 2 [IU] via SUBCUTANEOUS
  Administered 2021-05-09: 1 [IU] via SUBCUTANEOUS
  Administered 2021-05-09: 2 [IU] via SUBCUTANEOUS
  Administered 2021-05-10: 1 [IU] via SUBCUTANEOUS
  Administered 2021-05-10 – 2021-05-11 (×3): 2 [IU] via SUBCUTANEOUS
  Administered 2021-05-11: 1 [IU] via SUBCUTANEOUS

## 2021-05-06 MED ORDER — HYDROMORPHONE HCL 1 MG/ML IJ SOLN
0.5000 mg | INTRAMUSCULAR | Status: DC | PRN
  Administered 2021-05-07 – 2021-05-09 (×7): 0.5 mg via INTRAVENOUS
  Filled 2021-05-06: qty 0.5
  Filled 2021-05-06: qty 1
  Filled 2021-05-06 (×5): qty 0.5

## 2021-05-06 MED ORDER — NALOXONE HCL 0.4 MG/ML IJ SOLN: 0.4000 mg | INTRAMUSCULAR | Status: DC | PRN

## 2021-05-06 NOTE — ED Provider Notes (Signed)
[facility] EMERGENCY DEPARTMENT Provider Note   CSN: [id number] Arrival date & time: 05/06/21  1821     History Chief Complaint  Patient presents with   Hannah Schupp Grade is a 75 y.o. female.  Pt presents to the ED today with a fall and right hip pain.  Pt said that she did not feel well and thought she was going to fall and fell in her kitchen.  She is on Eliquis for a hx of afib, but did not hit her head.  No loc.    CHA2DS2/VAS Stroke Risk Points  Current as of 21 minutes ago     7 >= 2 Points: High Risk  1 - 1.99 Points: Medium Risk  0 Points: Low Risk    No Change      Details    This score determines the patient's risk of having a stroke if the  patient has atrial fibrillation.       Points Metrics  1 Has Congestive Heart Failure:  Yes    Current as of 21 minutes ago  1 Has Vascular Disease:  Yes    Current as of 21 minutes ago  1 Has Hypertension:  Yes    Current as of 21 minutes ago  2 Age:  66    Current as of 21 minutes ago  1 Has Diabetes:  Yes    Current as of 21 minutes ago  0 Had Stroke:  No  Had TIA:  No  Had Thromboembolism:  No    Current as of 21 minutes ago  1 Female:  Yes    Current as of 21 minutes ago                Past Medical History:  Diagnosis Date   Abnormally small mouth    Allergic rhinitis 10/14/2009   Qualifier: Diagnosis of  By: Lamonte Sakai MD, Rose Fillers   Overview:  Overview:  Qualifier: Diagnosis of  By: Lamonte Sakai MDRose Fillers  Last Assessment & Plan:  Please continue Xyzal and Nasacort as you have been using them   Anemia    Asthma 05/12/2009   10/12/2014 p extensive coaching HFA effectiveness =    75% s spacer    Overview:  Overview:  10/12/2014 p extensive coaching HFA effectiveness =    75% s spacer   Last Assessment & Plan:  Please continue Symbicort 2 puffs twice a day. Remember to rinse and gargle after taking this medication. Take albuterol 2 puffs up to every 4 hours if needed for shortness of breath.   Follow with Dr Lamonte Sakai in 6 month   Bilateral carotid artery stenosis    a. mild 1-39% by duplex 10/2017, due 2021.   Breast cancer ([place]) 01/18/2016   right breast   CAD (coronary artery disease) 01/24/2011   a. s/p CABG 1998 with redo 2009.   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   Chemotherapy-induced thrombocytopenia 04/06/2016   Chronic diastolic CHF (congestive heart failure) ([place]) 09/24/2013   CKD (chronic kidney disease), stage IV ([place]) 09/24/2013   Closed fracture of head of left humerus 19/41/7408   Complication of anesthesia    Dysrhythmia    a-fib   Gallstones    GERD (gastroesophageal reflux disease)    Glaucoma    H/O atrial tachycardia 05/12/2009   Qualifier: History of  By: Lamonte Sakai MD, Rose Fillers   Overview:  Overview:  Qualifier:  History of  By: Lamonte Sakai MD, Rose Fillers  Last Assessment & Plan:  There was no evidence of this on her cardiac monitor. I would recommend watchful waiting.    Heart murmur    History of COVID-19 07/2019   ASYMPTOMATIC   History of kidney stones    History of non-ST elevation myocardial infarction (NSTEMI)    Sept 2014--  thought to be type II HTN w/ LHC without infarct related artery and patent grafts   History of radiation therapy 05/24/16-07/26/16   right breast 50.4 Gy in 28 fractions, right breast boost 10 Gy in 5 fractions   Hyperlipidemia    Hypotension    Iron deficiency anemia    Mania ([place]) 03/05/2016   Moderate persistent asthma    pulmologist-  Dr. Malvin Johns   Osteomyelitis of toe of left foot ([place]) 04/06/2016   Osteopenia of multiple sites 10/19/2015   PAF (paroxysmal atrial fibrillation) ([place])    Personal history of chemotherapy 2017   Personal history of radiation therapy 2017   Pleural effusion on right 05/2019   RESOLVED   PONV (postoperative nausea and vomiting)    Psoriasis    right leg   Renal calculus, right    S/P AVR    prosthesis valve placement  2009 at same time re-do CABG   Seizure-like activity ([place]) 06/29/2016   EEG NORMAL, HEAD CT WITHOUT ACUTE FINDINGS   Sensorineural hearing loss (SNHL) of both ears 01/06/2016   Stroke ([place]) 2013   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   Syncope 09/06/2017   Type 2 diabetes mellitus ([place])    monitored by dr Dwyane Dee   Uses roller walker     Patient Active Problem List   Diagnosis Date Noted   Pacemaker 11/15/2020   Primary osteoarthritis, left shoulder 10/27/2020   Tachycardia-bradycardia syndrome ([place]) 08/10/2020   Convulsion ([place]) 05/30/2020   Compression fracture of L1 vertebra with routine healing 02/25/2020   Compression fracture of L1 lumbar vertebra, closed, initial encounter ([place]) 01/27/2020   Pain in left shoulder 03/26/2019   History of breast cancer in female 03/26/2019   Hypokalemia 02/20/2019   Acute exacerbation of CHF (congestive heart failure) ([place]) 02/19/2019   Pleural effusion on right 09/25/2018   Thrombocytopenia ([place]) 09/25/2018   Cholecystitis with cholelithiasis 07/07/2018   Prolonged Q-T interval on ECG 05/21/2018   Atrial fibrillation ([place]) 01/23/2018   Closed fracture of head of left humerus 09/09/2017   Syncope 09/06/2017   Seizure-like activity ([place]) 09/01/2017   AKI (acute kidney injury) ([place]) 09/01/2017   Adrenal insufficiency ([place]) 07/03/2016   Seizure ([place]) 06/29/2016   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   Chemotherapy-induced thrombocytopenia 04/06/2016   Osteomyelitis of toe of left foot ([place]) 04/06/2016   Mania ([place]) 03/05/2016   GERD (gastroesophageal reflux disease) 02/29/2016   Chemotherapy induced nausea and vomiting 02/24/2016   Sensorineural hearing loss (SNHL) of both ears 01/06/2016   Breast cancer of upper-inner quadrant of right female breast ([place]) 12/16/2015   Osteopenia of multiple sites 10/19/2015   CKD (chronic kidney disease), stage IV (HCC) 09/24/2013   Chronic diastolic CHF (congestive heart  failure) ([place]) 09/24/2013   Acute on chronic combined systolic and diastolic congestive heart failure ([place]) 07/17/2013   Atrial tachycardia ([place]) 04/03/2013   CAD (coronary artery disease) 01/24/2011   Chronic coronary artery disease 01/24/2011   History of aortic valve replacement 10/27/2010   Hyperlipidemia    DOE (dyspnea on exertion)    Nonischemic cardiomyopathy ([place])  Anemia of chronic disease    Type 2 diabetes mellitus ([place])    Iron deficiency anemia    Allergic rhinitis 10/14/2009   Essential hypertension 05/12/2009   H/O atrial tachycardia 05/12/2009   Asthma 05/12/2009    Past Surgical History:  Procedure Laterality Date   AORTIC VALVE REPLACEMENT  2009   #54m [facility]ase pericardial valve (done same time is CABG)   BREAST BIOPSY Right 02/12/2019   2 lymphnodes, 1 breast   BREAST LUMPECTOMY Right 01/18/2016   BREAST LUMPECTOMY WITH RADIOACTIVE SEED AND SENTINEL LYMPH NODE BIOPSY Right 01/18/2016   Procedure: RIGHT BREAST LUMPECTOMY WITH RADIOACTIVE SEED AND SENTINEL LYMPH NODE BIOPSY;  Surgeon: DAlphonsa Overall MD;  [facility]  Service: General;  Laterality: Right;   CARDIAC CATHETERIZATION  03/23/2008   Pre-redo CABG: L main OK, LAD (T), CFX (T), OM1 99%, RCA (T), LIMA-LAD OK, SVG-OM(?3) OK w/ little florw to OM2, SVG-RCA OK. EF NL   CARPAL TUNNEL RELEASE     CHEST TUBE INSERTION Right 06/26/2019   Procedure: INSERTION PLEURAL DRAINAGE CATHETER;  Surgeon: BGaye Pollack MD;  [facility];  Service: Thoracic;  Laterality: Right;   CHOLECYSTECTOMY N/A 07/07/2018   Procedure: LAPAROSCOPIC CHOLECYSTECTOMY WITH INTRAOPERATIVE CHOLANGIOGRAM ERAS PATHWAY;  Surgeon: NAlphonsa Overall MD;  [facility];  Service: General;  Laterality: N/A;   COLONOSCOPY     around 2015. Possibly with EBuhl&  re-do 2009   Had LIMA to DX/LAD, SVG to 2 marginal branches and SVG to [facility]; SVG to 3rd OM and PD at time of redo    CYSTOSCOPY W/ URETERAL STENT PLACEMENT Right 12/20/2014   Procedure: CYSTOSCOPY WITH RETROGRADE PYELOGRAM/URETERAL STENT PLACEMENT;  Surgeon: PCleon Gustin MD;  [facility]  Service: Urology;  Laterality: Right;   CYSTOSCOPY WITH RETROGRADE PYELOGRAM, URETEROSCOPY AND STENT PLACEMENT Left 08/21/2019   Procedure: CYSTOSCOPY WITH RETROGRADE PYELOGRAM, URETEROSCOPY AND STENT PLACEMENT;  Surgeon: MCleon Gustin MD;  [facility];  Service: Urology;  Laterality: Left;  1 HR   CYSTOSCOPY/URETEROSCOPY/HOLMIUM LASER/STENT PLACEMENT Right 04/04/2021   Procedure: CYSTOSCOPY/URETEROSCOPY/HOLMIUM LASER/STONE EXTRACTION AND STENT PLACEMENT;  Surgeon: NRemi Haggard MD;  [facility]  Service: Urology;  Laterality: Right;  1 HR   ESOPHAGOGASTRODUODENOSCOPY     many years ago per patient    ESOPHAGOGASTRODUODENOSCOPY ENDOSCOPY  06/17/2018   EYE SURGERY Bilateral    cataracts   HOLMIUM LASER APPLICATION Right 084/13/2440  Procedure:  HOLMIUM LASER LITHOTRIPSY;  Surgeon: PCleon Gustin MD;  [facility]  Service: Urology;  Laterality: Right;   HOLMIUM LASER APPLICATION Left 010/27/2536  Procedure: HOLMIUM LASER APPLICATION;  Surgeon: MCleon Gustin MD;  [facility];  Service: Urology;  Laterality: Left;   IR KYPHO LUMBAR INC FX REDUCE BONE BX UNI/BIL CANNULATION INC/IMAGING  02/12/2020   IR THORACENTESIS ASP PLEURAL SPACE W/IMG GUIDE  10/03/2018   IR THORACENTESIS ASP PLEURAL SPACE W/IMG GUIDE  06/08/2019   LEFT HEART CATHETERIZATION WITH CORONARY/GRAFT ANGIOGRAM N/A 02/23/2013   Procedure: LEFT HEART CATHETERIZATION WITH CBeatrix Fetters  Surgeon: MBlane Ohara MD;  [facility];  Service: Cardiovascular;  Laterality: N/A;   LOOP RECORDER INSERTION N/A 08/30/2017   Procedure: LOOP RECORDER INSERTION;  Surgeon: TEvans Lance MD;  [facility];  Service:  Cardiovascular;  Laterality: N/A;   LOOP RECORDER REMOVAL N/A 08/10/2020  Procedure: LOOP RECORDER REMOVAL;  Surgeon: Evans Lance, MD;  Location: [place] [facility];  Service: Cardiovascular;  Laterality: N/A;   PACEMAKER IMPLANT N/A 08/10/2020   Procedure: PACEMAKER IMPLANT;  Surgeon: Evans Lance, MD;  Location: [place] CV LAB;  Service: Cardiovascular;  Laterality: N/A;   PORTACATH PLACEMENT Left 01/18/2016   Procedure: INSERTION PORT-A-CATH;  Surgeon: Alphonsa Overall, MD;  Location: [place];  Service: General;  Laterality: Left;   portacath removal  06/2016   REMOVAL OF PLEURAL DRAINAGE CATHETER Right 07/14/2019   Procedure: REMOVAL OF PLEURAL DRAINAGE CATHETER;  Surgeon: Gaye Pollack, MD;  Location: [place];  Service: Thoracic;  Laterality: Right;   TALC PLEURODESIS N/A 06/26/2019   Procedure: Pietro Cassis;  Surgeon: Gaye Pollack, MD;  [facility];  Service: Thoracic;  Laterality: N/A;   TONSILLECTOMY     TRANSTHORACIC ECHOCARDIOGRAM  02/24/2013   mild LVH,  ef 50-55%/  AV bioprosthesis was present with very mild stenosis and no regurg., mean grandient 42mHg, peak grandient 274mg /  mild MR/  mild LAE and RAE/  moderate TR   TUBAL LIGATION  1983     OB History   No obstetric history on file.     Family History  Problem Relation Age of Onset   Heart disease Father    Heart failure Father    Diabetes Maternal Grandmother    Heart disease Maternal Grandmother    Diabetes Son    Healthy Brother        #1   Heart attack Brother        #2   Heart disease Brother        #2   Colon cancer Neg Hx    Esophageal cancer Neg Hx     Social History   Tobacco Use   Smoking status: Never   Smokeless tobacco: Never  Vaping Use   Vaping Use: Never used  Substance Use Topics   Alcohol use: No   Drug use: No    Home Medications Prior to Admission medications   Medication Sig Start Date End Date Taking? Authorizing Provider  Accu-Chek FastClix Lancets MISC  Use to check blood sugar 3 times per day 06/16/20   KuElayne SnareMD  ACCU-CHEK GUIDE test strip Use to test blood sugar 3 times daily 09/07/20   KuElayne SnareMD  acetaminophen (TYLENOL) 500 MG tablet Take 1,000 mg by mouth as needed for moderate pain.    [provider]  albuterol (PROVENTIL) (2.5 MG/3ML) 0.083% nebulizer solution Take 3 mLs (2.5 mg total) by nebulization every 6 (six) hours as needed for wheezing or shortness of breath. 06/09/20   Parrett, TaFonnie MuNP  albuterol (VENTOLIN HFA) 108 (90 Base) MCG/ACT inhaler Inhale 2 puffs into the lungs every 6 (six) hours as needed for wheezing or shortness of breath. 10/18/20   ByCollene GobbleMD  amiodarone (PACERONE) 200 MG tablet TAKE ONE TABLET BY MOUTH DAILY 02/24/21   TaEvans LanceMD  apixaban (ELIQUIS) 2.5 MG TABS tablet Take 1 tablet (2.5 mg total) by mouth 2 (two) times daily. 04/05/21   NeRemi HaggardMD  Blood Glucose Monitoring Suppl (ACCU-CHEK GUIDE ME) w/Device KIT Use accu chek meter to check blood sugar three times daily. 04/10/19   KuElayne SnareMD  cetirizine (ZYRTEC) 10 MG tablet Take 10 mg by mouth at bedtime.    [provider]  Cholecalciferol (DIALYVITE VITAMIN D 5000) 125 MCG (5000 UT) capsule Take  5,000 Units by mouth daily.    [provider]  Continuous Blood Gluc Receiver (FREESTYLE LIBRE 2 READER) DEVI Use to check blood sugars daily 12/09/20   Elayne Snare, MD  DULoxetine (CYMBALTA) 20 MG capsule Take 1 capsule (20 mg total) by mouth daily. 03/17/21   Nicholas Lose, MD  exemestane (AROMASIN) 25 MG tablet TAKE ONE TABLET BY MOUTH DAILY AFTER BREAKFAST 09/06/20   Nicholas Lose, MD  ezetimibe (ZETIA) 10 MG tablet Take 1 tablet (10 mg total) by mouth daily. 11/10/20   Evans Lance, MD  famotidine (PEPCID) 20 MG tablet TAKE ONE TABLET BY MOUTH TWICE DAILY. ONE AFTER SUPPER 08/30/20   Evans Lance, MD  furosemide (LASIX) 20 MG tablet Take 20 mg by mouth daily as needed for fluid.    [provider]  insulin glargine (LANTUS) 100 UNIT/ML injection Inject 24 Units into the skin daily.    [provider]  Insulin Lispro-aabc (LYUMJEV) 100 UNIT/ML SOLN Inject 10 units before breakfast, 18 before lunch and 14 before dinner.  Replaces Humalog 04/28/21   Elayne Snare, MD  Insulin Pen Needle (BD PEN NEEDLE NANO U/F) 32G X 4 MM MISC USE AS INSTRUCTED TO INJECT INSULIN 4 TIMES DAILY. 09/16/19   Elayne Snare, MD  metoprolol succinate (TOPROL-XL) 25 MG 24 hr tablet TAKE ONE-HALF TABLET BY MOUTH EVERY DAY 04/10/21   Larey Dresser, MD  nitroGLYCERIN (NITROSTAT) 0.4 MG SL tablet Place 1 tablet (0.4 mg total) under the tongue every 5 (five) minutes as needed for chest pain. 04/04/20 03/23/22  [name], NP  ondansetron (ZOFRAN ODT) 4 MG disintegrating tablet Take 1 tablet (4 mg total) by mouth every 8 (eight) hours as needed. 01/20/20   Isla Pence, MD  rosuvastatin (CRESTOR) 40 MG tablet Take 1 tablet (40 mg total) by mouth at bedtime. 03/22/21   Larey Dresser, MD  SYMBICORT 160-4.5 MCG/ACT inhaler INHALE TWO PUFFS INTO THE LUNGS TWICE DAILY 11/16/20   Collene Gobble, MD  TRULICITY 1.5 LH/7.3SK [facility] INJECT THE CONTENTS OF ONE  PEN SUBCUTANEOUSLY WEEKLY  AS DIRECTED 04/22/20   Elayne Snare, MD  vitamin B-12 (CYANOCOBALAMIN) 100 MCG tablet Take 100 mcg by mouth daily.    [provider]  zolpidem (AMBIEN) 5 MG tablet Take 7.5 mg by mouth at bedtime as needed for sleep.    [provider]    Allergies    Amoxicillin, Tape, Aldactone [spironolactone], Isosorbide nitrate, K-flex [orphenadrine], Arimidex [anastrozole], Latex, and Tetracycline  Review of Systems   Review of Systems  Musculoskeletal:        Right hip pain  All other systems reviewed and are negative.  Physical Exam Updated Vital Signs BP (!) 198/64   Pulse 86   Temp 97.7 F (36.5 C) (Oral)   Resp (!) 21   Ht 5' 4"  (1.626 m)   Wt 63.5 kg   LMP  (LMP Unknown)   SpO2 100%   BMI 24.03  kg/m   Physical Exam Vitals and nursing note reviewed.  Constitutional:      Appearance: Normal appearance.  HENT:     Head: Normocephalic and atraumatic.     Right Ear: External ear normal.     Left Ear: External ear normal.     Nose: Nose normal.     Mouth/Throat:     Mouth: Mucous membranes are moist.     Pharynx: Oropharynx is clear.  Eyes:     Extraocular Movements: Extraocular movements intact.  Conjunctiva/sclera: Conjunctivae normal.     Pupils: Pupils are equal, round, and reactive to light.  Cardiovascular:     Rate and Rhythm: Normal rate and regular rhythm.     Pulses: Normal pulses.     Heart sounds: Normal heart sounds.  Pulmonary:     Effort: Pulmonary effort is normal.     Breath sounds: Normal breath sounds.  Abdominal:     General: Abdomen is flat. Bowel sounds are normal.     Palpations: Abdomen is soft.  Musculoskeletal:     Cervical back: Normal range of motion and neck supple.     Right hip: Deformity and bony tenderness present. Decreased range of motion. Decreased strength.       Legs:  Skin:    General: Skin is warm.     Capillary Refill: Capillary refill takes less than 2 seconds.  Neurological:     General: No focal deficit present.     Mental Status: She is alert and oriented to person, place, and time.  Psychiatric:        Mood and Affect: Mood normal.        Behavior: Behavior normal.    ED Results / Procedures / Treatments   Labs (all labs ordered are listed, but only abnormal results are displayed) Labs Reviewed  BASIC METABOLIC PANEL - Abnormal; Notable for the following components:      Result Value   Glucose, Bld 166 (*)    BUN 26 (*)    Creatinine, Ser 2.25 (*)    GFR, Estimated 22 (*)    All other components within normal limits  CBC WITH DIFFERENTIAL/PLATELET - Abnormal; Notable for the following components:   RBC 3.65 (*)    Hemoglobin 10.5 (*)    HCT 35.2 (*)    MCHC 29.8 (*)    Platelets 129 (*)    All other  components within normal limits  PROTIME-INR - Abnormal; Notable for the following components:   Prothrombin Time 15.8 (*)    INR 1.3 (*)    All other components within normal limits  RESP PANEL BY RT-PCR (FLU A&B, COVID) [id number]  URINALYSIS, ROUTINE W REFLEX MICROSCOPIC  TYPE AND SCREEN    EKG EKG Interpretation  Date/Time:  Saturday May 06 2021 18:36:50 EST Ventricular Rate:  86 PR Interval:    QRS Duration: 100 QT Interval:  418 QTC Calculation: 500 R Axis:   -40 Text Interpretation: ATRIAL PACED RHYTHM Left anterior fascicular block Abnormal R-wave progression, early transition Probable left ventricular hypertrophy Borderline prolonged QT interval No significant change since last tracing Confirmed by Isla Pence [phone]) on 05/06/2021 7:57:28 PM  Radiology DG Chest 1 View  Result Date: 05/06/2021 CLINICAL DATA:  Initial evaluation for acute trauma, fall. EXAM: CHEST  1 VIEW COMPARISON:  Prior radiograph from 08/10/2020. FINDINGS: Left-sided pacemaker/AICD in place. Median sternotomy wires underlying surgical clips and CABG markers noted. Valvular replacement noted as well. Transverse heart size at the upper limits of normal, stable. Mediastinal silhouette within normal limits. Aortic atherosclerosis. Lungs normally inflated. Mild linear subsegmental atelectasis present at the right lung base. Blunting of the right costophrenic angle suggestive of a trace right pleural effusion. No pulmonary edema. No focal infiltrates. No pneumothorax. Osteopenia. No definite acute osseous abnormality. Advanced arthritic changes noted about the shoulders. IMPRESSION: 1. Trace right pleural effusion with associated mild right basilar subsegmental atelectasis. 2. No other active cardiopulmonary disease. 3.  Aortic Atherosclerosis (ICD10-I70.0). Electronically Signed   By: Pincus Badder.D.  On: 05/06/2021 19:27   DG Knee 1-2 Views Right  Result Date: 05/06/2021 CLINICAL DATA:  Initial  evaluation for acute trauma, fall. EXAM: RIGHT KNEE - 1-2 VIEW COMPARISON:  None. FINDINGS: No acute fracture or dislocation. No joint effusion. Mild osteoarthritic changes about the knee. Advanced osteopenia. No acute soft tissue abnormality. Prominent vascular calcifications noted. Surgical clips from presumed previous vessel harvesting noted at the right lower extremity. IMPRESSION: 1. No acute osseous abnormality about the right knee. 2. Advanced osteopenia. Electronically Signed   By: Jeannine Boga M.D.   On: 05/06/2021 19:29   DG Hip Unilat With Pelvis 2-3 Views Right  Result Date: 05/06/2021 CLINICAL DATA:  Initial evaluation for acute pain status post fall. EXAM: DG HIP (WITH OR WITHOUT PELVIS) 2-3V RIGHT COMPARISON:  None. FINDINGS: Acute comminuted intratrochanteric fracture of the right hip with superior subluxation. Femoral head remains normally position within the acetabulum. Femoral head height maintained. Remainder of the bony pelvis grossly intact. Underlying severe osteopenia. Moderate spondylosis noted within the lower lumbar spine. No visible soft tissue injury. Prominent vascular calcifications noted about the pelvis and proximal thighs. IMPRESSION: Acute comminuted intertrochanteric fracture of the right hip. Electronically Signed   By: Jeannine Boga M.D.   On: 05/06/2021 19:25    Procedures Procedures   Medications Ordered in ED Medications  0.9 %  sodium chloride infusion (has no administration in time range)  morphine 4 MG/ML injection 4 mg (4 mg Intravenous Given 05/06/21 1852)  HYDROmorphone (DILAUDID) injection 1 mg (has no administration in time range)    ED Course  I have reviewed the triage vital signs and the nursing notes.  Pertinent labs & imaging results that were available during my care of the patient were reviewed by me and considered in my medical decision making (see chart for details).    MDM Rules/Calculators/A&P                            Unfortunately, pt has a right IT fx.  Pain still bad with morphine, so I gave her dilaudid as well.  Pt's primary orthopedist is Dr. Durward Fortes.  I spoke with Dr. Sammuel Hines who is on call for him.  He recommends npo after midnight and he will operate tomorrow.  Pt d/w Dr. Velia Meyer (triad) for admission.  Final Clinical Impression(s) / ED Diagnoses Final diagnoses:  Fall, initial encounter  Closed nondisplaced intertrochanteric fracture of right femur, initial encounter ([place])  CKD (chronic kidney disease) stage 4, GFR 15-29 ml/min [facility])    Rx / DC Orders ED Discharge Orders     None        Isla Pence, MD 05/06/21 2007

## 2021-05-06 NOTE — ED Notes (Signed)
Patient reports nausea and pain have improved

## 2021-05-06 NOTE — H&P (Signed)
History and Physical    PLEASE NOTE THAT DRAGON DICTATION SOFTWARE WAS USED IN THE CONSTRUCTION OF THIS NOTE.   Hannah [name] MVE:[id number] DOB: Oct 31, 1945 DOA: 05/06/2021  PCP: [name], DO  Patient coming from: home   I have personally briefly reviewed patient's old medical records in [place]  Chief Complaint: right hip pain  HPI: Hannah [name] is a 75 y.o. female with medical history significant for chronic diastolic heart failure, persistent atrial fibrillation chronically anticoagulated on Eliquis, type 2 diabetes mellitus, moderate persistent asthma stage IV chronic kidney disease with baseline creatinine 2.2-2.5, who is admitted to [facility] on 05/06/2021 with acute right intertrochanteric hip fracture after presenting from home to [facility] ED complaining of right hip pain after fall at home.   Patient reports tripping while attempting to ambulate in her kitchen at home earlier today, resulting in a ground level fall during which her right hip was the principal point of contact with the floor below. As a result of this fall, the patient reports immediate development of sharp right hip pain, with radiation into the right groin. States that this pain has been constant since onset, with exacerbation when attempting to move the right lower extremity.  As a consequence of the associated intensity of this discomfort, reports that she is unable to bear weight on the right lower extremity at this time.  This is relative to baseline functional status in which the patient lives independently with her husband at home. She also notes that hit the antero-lateral aspect of her right knee as secondary component of the above fall, nothing ensuing mild pain a/w the anterior aspect of the right knee. Otherwise, denies any acute arthralgias or myalgias as a result of the above fall.  Denies any acute numbness or paresthesias in bilateral lower extremities, and confirms that right hip  representations a native joint.  Fall was Witnessed by patient's husband who confirms that patient did not hit head as a component of this fall, and was not associated loss of consciousness.  Denies any preceding or associated chest pain, shortness of breath, diaphoresis, palpitations, nausea, vomiting, dizziness, presyncope, or syncope.  Denies any subsequent headache, neck pain, blurry vision, or diplopia.  In the setting of atrial fibrillation, she is chronically anticoagulated on Eliquis, with most recent dose occurring on the morning of 05/06/21.  Otherwise on no blood thinners at home, including no aspirin.   Per chart review, has a history of chronic diastolic heart failure, with most recent echocardiogram in April 2022 demonstrating LVEF 60 to 65%, mild concentric LVH, grade 2 diastolic dysfunction, and mild mitral regurgitation.  She is on Lasix on a fairly prn basis at home.  Not on any scheduled diuretic medications at home. denies any recent orthopnea, PND, or peripheral edema.     ED Course:  Vital signs in the ED were notable for the following: Afebrile; heart rate 67-88; initial blood pressure 191/73, which subsequently decreased to 170/87 following improvement in pain control after multiple doses of IV analgesia, as further detailed below; respiratory rate 17-21, oxygen saturation 98 to 100% on room air.  Labs were notable for the following: BMP notable for creatinine 2.25 compared to most recent prior value of 2.29 on 04/20/2021, glucose 166.  CBC notable for white blood cell count 6200, hemoglobin 10.5 compared to 9.4 on 04/20/2021.  INR 1.3.  Type and screen completed in the ED today.  Urinalysis, pending.  Screening COVID-19/influenza PCR were checked in the  ED today, with results currently pending.  Imaging and additional notable ED work-up: Plain films of the right hip/pelvis showed acute comminuted intertrochanteric fracture of the right hip.  Pelvis of right knee showed no acute  osseous abnormality.  EDP discussed the patient's case and imaging with the on-call orthopedic surgeon, Dr. Sammuel Hines, who recommended admission to the hospitalist service at [facility] for further evaluation and management of acute right hip fracture.  Dr. Sammuel Hines to formally consult, is aware that patient is on eliquis will most recent dose this AM. He will evaluate the patient in the morning, and anticipating taking patient to OR tomorrow afternoon (11/27) for definitive surgical management of right hip fx.   While in the ED, the following were administered: Morphine 4 mg IV x1, Dilaudid 1 mg IV x1.  Additionally, patient was started on continuous normal saline at 125 cc/h.     Review of Systems: As per HPI otherwise 10 point review of systems negative.   Past Medical History:  Diagnosis Date   Abnormally small mouth    Allergic rhinitis 10/14/2009   Qualifier: Diagnosis of  By: Lamonte Sakai MD, Rose Fillers   Overview:  Overview:  Qualifier: Diagnosis of  By: Lamonte Sakai MD, Rose Fillers  Last Assessment & Plan:  Please continue Xyzal and Nasacort as you have been using them   Anemia    Asthma 05/12/2009   10/12/2014 p extensive coaching HFA effectiveness =    75% s spacer    Overview:  Overview:  10/12/2014 p extensive coaching HFA effectiveness =    75% s spacer   Last Assessment & Plan:  Please continue Symbicort 2 puffs twice a day. Remember to rinse and gargle after taking this medication. Take albuterol 2 puffs up to every 4 hours if needed for shortness of breath.  Follow with Dr Lamonte Sakai in 6 month   Bilateral carotid artery stenosis    a. mild 1-39% by duplex 10/2017, due 2021.   Breast cancer ([place]) 01/18/2016   right breast   CAD (coronary artery disease) 01/24/2011   a. s/p CABG 1998 with redo 2009.   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   Chemotherapy-induced thrombocytopenia 04/06/2016   Chronic diastolic CHF (congestive heart  failure) ([place]) 09/24/2013   CKD (chronic kidney disease), stage IV ([place]) 09/24/2013   Closed fracture of head of left humerus 40/97/3532   Complication of anesthesia    Dysrhythmia    a-fib   Gallstones    GERD (gastroesophageal reflux disease)    Glaucoma    H/O atrial tachycardia 05/12/2009   Qualifier: History of  By: Lamonte Sakai MD, Rose Fillers   Overview:  Overview:  Qualifier: History of  By: Lamonte Sakai MD, Rose Fillers  Last Assessment & Plan:  There was no evidence of this on her cardiac monitor. I would recommend watchful waiting.    Heart murmur    History of COVID-19 07/2019   ASYMPTOMATIC   History of kidney stones    History of non-ST elevation myocardial infarction (NSTEMI)    Sept 2014--  thought to be type II HTN w/ LHC without infarct related artery and patent grafts   History of radiation therapy 05/24/16-07/26/16   right breast 50.4 Gy in 28 fractions, right breast boost 10 Gy in 5 fractions   Hyperlipidemia    Hypotension    Iron deficiency anemia    [place] 03/05/2016   Moderate persistent asthma    pulmologist-  Dr. Malvin Johns  Osteomyelitis of toe of left foot ([place]) 04/06/2016   Osteopenia of multiple sites 10/19/2015   PAF (paroxysmal atrial fibrillation) ([place])    Personal history of chemotherapy 2017   Personal history of radiation therapy 2017   Pleural effusion on right 05/2019   RESOLVED   PONV (postoperative nausea and vomiting)    Psoriasis    right leg   Renal calculus, right    S/P AVR    prosthesis valve placement 2009 at same time re-do CABG   Seizure-like activity ([place]) 06/29/2016   EEG NORMAL, HEAD CT WITHOUT ACUTE FINDINGS   Sensorineural hearing loss (SNHL) of both ears 01/06/2016   Stroke ([place]) 2013   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   Syncope 09/06/2017   Type 2 diabetes mellitus ([place])    monitored by dr Dwyane Dee   Uses roller walker     Past Surgical History:  Procedure Laterality Date   AORTIC VALVE REPLACEMENT  2009   [facility]done same time is [facility]   BREAST BIOPSY Right 02/12/2019   2 lymphnodes, 1 breast   BREAST LUMPECTOMY Right 01/18/2016   BREAST LUMPECTOMY WITH RADIOACTIVE SEED AND SENTINEL LYMPH NODE BIOPSY Right 01/18/2016   Procedure: RIGHT BREAST LUMPECTOMY WITH RADIOACTIVE SEED AND SENTINEL LYMPH NODE BIOPSY;  Surgeon: DAlphonsa Overall MD;  [place]  Service: General;  Laterality: Right;   CARDIAC CATHETERIZATION  03/23/2008   Pre-redo CABG: L main OK, LAD (T), CFX (T), OM1 99%, RCA (T), LIMA-LAD OK, SVG-OM(?3) OK w/ little florw to OM2, SVG-RCA OK. EF NL   CARPAL TUNNEL RELEASE     CHEST TUBE INSERTION Right 06/26/2019   Procedure: INSERTION PLEURAL DRAINAGE CATHETER;  Surgeon: BGaye Pollack MD;  [facility];  Service: Thoracic;  Laterality: Right;   CHOLECYSTECTOMY N/A 07/07/2018   Procedure: LAPAROSCOPIC CHOLECYSTECTOMY WITH INTRAOPERATIVE CHOLANGIOGRAM ERAS PATHWAY;  Surgeon: NAlphonsa Overall MD;  [facility];  Service: General;  Laterality: N/A;   COLONOSCOPY     around 2015. Possibly with EAline&  re-do 2009   Had LIMA to DX/LAD, SVG to 2 marginal branches and [facility]; SVG to 3rd OM and PD at time of redo   CYSTOSCOPY W/ URETERAL STENT PLACEMENT Right 12/20/2014   Procedure: CYSTOSCOPY WITH RETROGRADE PYELOGRAM/URETERAL STENT PLACEMENT;  Surgeon: PCleon Gustin MD;  [facility]  Service: Urology;  Laterality: Right;   CYSTOSCOPY WITH RETROGRADE PYELOGRAM, URETEROSCOPY AND STENT PLACEMENT Left 08/21/2019   Procedure: CYSTOSCOPY WITH RETROGRADE PYELOGRAM, URETEROSCOPY AND STENT PLACEMENT;  Surgeon: MCleon Gustin MD;  [facility];  Service: Urology;  Laterality: Left;  1 HR   CYSTOSCOPY/URETEROSCOPY/HOLMIUM LASER/STENT PLACEMENT Right 04/04/2021   Procedure: CYSTOSCOPY/URETEROSCOPY/HOLMIUM LASER/STONE EXTRACTION AND STENT PLACEMENT;  Surgeon: NRemi Haggard MD;   [facility]  Service: Urology;  Laterality: Right;  1 HR   ESOPHAGOGASTRODUODENOSCOPY     many years ago per patient    ESOPHAGOGASTRODUODENOSCOPY ENDOSCOPY  06/17/2018   EYE SURGERY Bilateral    cataracts   HOLMIUM LASER APPLICATION Right 017/61/6073  Procedure:  HOLMIUM LASER LITHOTRIPSY;  Surgeon: PCleon Gustin MD;  [facility]  Service: Urology;  Laterality: Right;   HOLMIUM LASER APPLICATION Left 071/11/2692  Procedure: HOLMIUM LASER APPLICATION;  Surgeon: MCleon Gustin MD;  [facility];  Service: Urology;  Laterality: Left;  IR KYPHO LUMBAR INC FX REDUCE BONE BX UNI/BIL CANNULATION INC/IMAGING  02/12/2020   IR THORACENTESIS ASP PLEURAL SPACE W/IMG GUIDE  10/03/2018   IR THORACENTESIS ASP PLEURAL SPACE W/IMG GUIDE  06/08/2019   LEFT HEART CATHETERIZATION WITH CORONARY/GRAFT ANGIOGRAM N/A 02/23/2013   Procedure: LEFT HEART CATHETERIZATION WITH Beatrix Fetters;  Surgeon: Blane Ohara, MD;  Location: [facility] C[facility] LAB;  Service: Cardiovascular;  Laterality: N/A;   LOOP RECORDER INSERTION N/A 08/30/2017   Procedure: LOOP RECORDER INSERTION;  Surgeon: Evans Lance, MD;  Location: [place] CV LAB;  Service: Cardiovascular;  Laterality: N/A;   LOOP RECORDER REMOVAL N/A 08/10/2020   Procedure: LOOP RECORDER REMOVAL;  Surgeon: Evans Lance, MD;  Location: [place] CV LAB;  Service: Cardiovascular;  Laterality: N/A;   PACEMAKER IMPLANT N/A 08/10/2020   Procedure: PACEMAKER IMPLANT;  Surgeon: Evans Lance, MD;  Location: [place] CV LAB;  Service: Cardiovascular;  Laterality: N/A;   PORTACATH PLACEMENT Left 01/18/2016   Procedure: INSERTION PORT-A-CATH;  Surgeon: Alphonsa Overall, MD;  Location: [place];  Service: General;  Laterality: Left;   portacath removal  06/2016   REMOVAL OF PLEURAL DRAINAGE CATHETER Right 07/14/2019   Procedure: REMOVAL OF PLEURAL DRAINAGE CATHETER;  Surgeon: Gaye Pollack, MD;   Location: [place];  Service: Thoracic;  Laterality: Right;   TALC PLEURODESIS N/A 06/26/2019   Procedure: Pietro Cassis;  Surgeon: Gaye Pollack, MD;  Location: [facility];  Service: Thoracic;  Laterality: N/A;   TONSILLECTOMY     TRANSTHORACIC ECHOCARDIOGRAM  02/24/2013   mild LVH,  ef 50-55%/  AV bioprosthesis was present with very mild stenosis and no regurg., mean grandient 11mHg, peak grandient 286mg /  mild MR/  mild LAE and RAE/  moderate TR   TUBAL LIGATION  1983    Social History:  reports that she has never smoked. She has never used smokeless tobacco. She reports that she does not drink alcohol and does not use drugs.   Allergies  Allergen Reactions   Amoxicillin Rash and Other (See Comments)    Tolerates Cephalosporins Has patient had a PCN reaction causing immediate rash, facial/tongue/throat swelling, SOB or lightheadedness with hypotension: Yes Has patient had a PCN reaction causing severe rash involving mucus membranes or skin necrosis: Yes Has patient had a PCN reaction that required hospitalization No Has patient had a PCN reaction occurring within the last 10 years: No If all of the above answers are "NO", then may proceed with Cephalosporin use.    Tape Other (See Comments)    Pulls off skin, must use paper tape   Aldactone [Spironolactone] Other (See Comments)    CKD/hypokalemia   Isosorbide Nitrate     Other reaction(s): fainting   K-Flex [Orphenadrine]     Other reaction(s): stomach pain   Arimidex [Anastrozole] Nausea Only   Latex Itching and Other (See Comments)    (Dentist office)   Tetracycline Rash    Family History  Problem Relation Age of Onset   Heart disease Father    Heart failure Father    Diabetes Maternal Grandmother    Heart disease Maternal Grandmother    Diabetes Son    Healthy Brother        #1   Heart attack Brother        #2   Heart disease Brother        #2   Colon cancer Neg Hx    Esophageal cancer Neg Hx     Family  history reviewed and not pertinent    Prior to Admission medications   Medication Sig Start Date End Date Taking? Authorizing Provider  acetaminophen (TYLENOL) 500 MG tablet Take 1,000 mg by mouth as needed for moderate pain.   Yes [provider]  albuterol (PROVENTIL) (2.5 MG/3ML) 0.083% nebulizer solution Take 3 mLs (2.5 mg total) by nebulization every 6 (six) hours as needed for wheezing or shortness of breath. 06/09/20  Yes Parrett, Tammy S, NP  albuterol (VENTOLIN HFA) 108 (90 Base) MCG/ACT inhaler Inhale 2 puffs into the lungs every 6 (six) hours as needed for wheezing or shortness of breath. 10/18/20  Yes Collene Gobble, MD  amiodarone (PACERONE) 200 MG tablet TAKE ONE TABLET BY MOUTH DAILY Patient taking differently: Take 200 mg by mouth daily. 02/24/21  Yes Evans Lance, MD  apixaban (ELIQUIS) 2.5 MG TABS tablet Take 1 tablet (2.5 mg total) by mouth 2 (two) times daily. 04/05/21  Yes Remi Haggard, MD  cetirizine (ZYRTEC) 10 MG tablet Take 10 mg by mouth at bedtime.   Yes [provider]  Cholecalciferol (DIALYVITE VITAMIN D 5000) 125 MCG (5000 UT) capsule Take 5,000 Units by mouth daily.   Yes [provider]  DULoxetine (CYMBALTA) 20 MG capsule Take 1 capsule (20 mg total) by mouth daily. 03/17/21  Yes Nicholas Lose, MD  exemestane (AROMASIN) 25 MG tablet TAKE ONE TABLET BY MOUTH DAILY AFTER BREAKFAST Patient taking differently: 25 mg daily after breakfast. 09/06/20  Yes Nicholas Lose, MD  ezetimibe (ZETIA) 10 MG tablet Take 1 tablet (10 mg total) by mouth daily. 11/10/20  Yes Evans Lance, MD  famotidine (PEPCID) 20 MG tablet TAKE ONE TABLET BY MOUTH TWICE DAILY. ONE AFTER SUPPER Patient taking differently: Take 20 mg by mouth 2 (two) times daily. 08/30/20  Yes Evans Lance, MD  furosemide (LASIX) 20 MG tablet Take 20 mg by mouth daily as needed for fluid.   Yes [provider]  insulin glargine (LANTUS) 100 UNIT/ML injection Inject 24 Units  into the skin daily.   Yes [provider]  insulin lispro (HUMALOG) 100 UNIT/ML injection Inject 14 Units into the skin 3 (three) times daily before meals.   Yes [provider]  metoprolol succinate (TOPROL-XL) 25 MG 24 hr tablet TAKE ONE-HALF TABLET BY MOUTH EVERY DAY Patient taking differently: 12.5 mg daily. 04/10/21  Yes Larey Dresser, MD  nitroGLYCERIN (NITROSTAT) 0.4 MG SL tablet Place 1 tablet (0.4 mg total) under the tongue every 5 (five) minutes as needed for chest pain. 04/04/20 03/23/22 Yes [name], NP  rosuvastatin (CRESTOR) 40 MG tablet Take 1 tablet (40 mg total) by mouth at bedtime. 03/22/21  Yes Larey Dresser, MD  SYMBICORT 160-4.5 MCG/ACT inhaler INHALE TWO PUFFS INTO THE LUNGS TWICE DAILY Patient taking differently: 2 puffs in the morning and at bedtime. 11/16/20  Yes Byrum, Rose Fillers, MD  TRULICITY 1.5 LY/5.9MB SOPN INJECT THE CONTENTS OF ONE  PEN SUBCUTANEOUSLY WEEKLY  AS DIRECTED Patient taking differently: Inject 1.5 mg into the skin once a week. 04/22/20  Yes Elayne Snare, MD  vitamin B-12 (CYANOCOBALAMIN) 100 MCG tablet Take 100 mcg by mouth daily.   Yes [provider]  zolpidem (AMBIEN) 5 MG tablet Take 7.5 mg by mouth at bedtime as needed for sleep.   Yes [provider]  Accu-Chek FastClix Lancets MISC Use to check blood sugar 3 times per day 06/16/20   Elayne Snare, MD  ACCU-CHEK GUIDE test  strip Use to test blood sugar 3 times daily 09/07/20   Elayne Snare, MD  Blood Glucose Monitoring Suppl (ACCU-CHEK GUIDE ME) w/Device KIT Use accu chek meter to check blood sugar three times daily. 04/10/19   Elayne Snare, MD  Continuous Blood Gluc Receiver (FREESTYLE LIBRE 2 READER) DEVI Use to check blood sugars daily 12/09/20   Elayne Snare, MD  Insulin Lispro-aabc (LYUMJEV) 100 UNIT/ML SOLN Inject 10 units before breakfast, 18 before lunch and 14 before dinner.  Replaces Humalog Patient not taking: Reported on 05/06/2021 04/28/21   Elayne Snare, MD  Insulin Pen Needle (BD PEN NEEDLE NANO U/F) 32G X 4 MM MISC USE AS INSTRUCTED TO INJECT INSULIN 4 TIMES DAILY. 09/16/19   Elayne Snare, MD  ondansetron (ZOFRAN ODT) 4 MG disintegrating tablet Take 1 tablet (4 mg total) by mouth every 8 (eight) hours as needed. Patient not taking: Reported on 05/06/2021 01/20/20   Isla Pence, MD     Objective    Physical Exam: Vitals:   05/06/21 1827 05/06/21 1830 05/06/21 1832 05/06/21 1845  BP:  (!) 191/73  (!) 198/64  Pulse:   88 86  Resp:  17  (!) 21  Temp:  97.7 F (36.5 C)    TempSrc:  Oral    SpO2: 99% 98%  100%  Weight:   63.5 kg   Height:   _0  (1.626 m)     General: appears to be stated age; alert, oriented Skin: warm, dry, no rash Head:  AT/[place] Mouth:  Oral mucosa membranes appear moist, normal dentition Neck: supple; trachea midline Heart:  RRR; did not appreciate any M/R/G Lungs: CTAB, did not appreciate any wheezes, rales, or rhonchi Abdomen: + BS; soft, ND, NT Vascular: 2+ pedal pulses b/l; 2+ radial pulses b/l Extremities: Right lower extremity appears externally rotated ; no peripheral edema, no muscle wasting Neuro: sensation intact in upper and lower extremities b/l; strength assessment of RLE deferred in setting of presenting acute right hip fx.     Labs on Admission: I have personally reviewed following labs and imaging studies  CBC: Recent Labs  Lab 05/06/21 1824  WBC 6.2  NEUTROABS 4.6  HGB 10.5*  HCT 35.2*  MCV 96.4  PLT 093*   Basic Metabolic Panel: Recent Labs  Lab 05/06/21 1824  NA 135  K 4.2  CL 102  CO2 24  GLUCOSE 166*  BUN 26*  CREATININE 2.25*  CALCIUM 9.3   GFR: Estimated Creatinine Clearance: 18.7 mL/min (A) (by C-G formula based on SCr of 2.25 mg/dL (H)). Liver Function Tests: No results for input(s): AST, ALT, ALKPHOS, BILITOT, PROT, ALBUMIN in the last 168 hours. No results for input(s): LIPASE, AMYLASE in the last 168 hours. No results for input(s): AMMONIA in the  last 168 hours. Coagulation Profile: Recent Labs  Lab 05/06/21 1824  INR 1.3*   Cardiac Enzymes: No results for input(s): CKTOTAL, CKMB, CKMBINDEX, TROPONINI in the last 168 hours. BNP (last 3 results) No results for input(s): PROBNP in the last 8760 hours. HbA1C: No results for input(s): HGBA1C in the last 72 hours. CBG: No results for input(s): GLUCAP in the last 168 hours. Lipid Profile: No results for input(s): CHOL, HDL, LDLCALC, TRIG, CHOLHDL, LDLDIRECT in the last 72 hours. Thyroid Function Tests: No results for input(s): TSH, T4TOTAL, FREET4, T3FREE, THYROIDAB in the last 72 hours. Anemia Panel: No results for input(s): VITAMINB12, FOLATE, FERRITIN, TIBC, IRON, RETICCTPCT in the last 72 hours. Urine analysis:    Component Value  Date/Time   COLORURINE YELLOW 02/12/2020 1005   APPEARANCEUR HAZY (A) 02/12/2020 1005   LABSPEC 1.016 02/12/2020 1005   LABSPEC 1.020 02/24/2016 1119   PHURINE 5.0 02/12/2020 1005   GLUCOSEU 50 (A) 02/12/2020 1005   GLUCOSEU 500 (A) 07/02/2019 1501   GLUCOSEU Negative 02/24/2016 1119   HGBUR MODERATE (A) 02/12/2020 1005   BILIRUBINUR NEGATIVE 02/12/2020 1005   BILIRUBINUR Negative 02/24/2016 1119   KETONESUR NEGATIVE 02/12/2020 1005   PROTEINUR 30 (A) 02/12/2020 1005   UROBILINOGEN 0.2 07/02/2019 1501   UROBILINOGEN 0.2 02/24/2016 1119   NITRITE NEGATIVE 02/12/2020 1005   LEUKOCYTESUR MODERATE (A) 02/12/2020 1005   LEUKOCYTESUR Trace 02/24/2016 1119    Radiological Exams on Admission: DG Chest 1 View  Result Date: 05/06/2021 CLINICAL DATA:  Initial evaluation for acute trauma, fall. EXAM: CHEST  1 VIEW COMPARISON:  Prior radiograph from 08/10/2020. FINDINGS: Left-sided pacemaker/AICD in place. Median sternotomy wires underlying surgical clips and CABG markers noted. Valvular replacement noted as well. Transverse heart size at the upper limits of normal, stable. Mediastinal silhouette within normal limits. Aortic atherosclerosis. Lungs  normally inflated. Mild linear subsegmental atelectasis present at the right lung base. Blunting of the right costophrenic angle suggestive of a trace right pleural effusion. No pulmonary edema. No focal infiltrates. No pneumothorax. Osteopenia. No definite acute osseous abnormality. Advanced arthritic changes noted about the shoulders. IMPRESSION: 1. Trace right pleural effusion with associated mild right basilar subsegmental atelectasis. 2. No other active cardiopulmonary disease. 3.  Aortic Atherosclerosis (ICD10-I70.0). Electronically Signed   By: Jeannine Boga M.D.   On: 05/06/2021 19:27   DG Knee 1-2 Views Right  Result Date: 05/06/2021 CLINICAL DATA:  Initial evaluation for acute trauma, fall. EXAM: RIGHT KNEE - 1-2 VIEW COMPARISON:  None. FINDINGS: No acute fracture or dislocation. No joint effusion. Mild osteoarthritic changes about the knee. Advanced osteopenia. No acute soft tissue abnormality. Prominent vascular calcifications noted. Surgical clips from presumed previous vessel harvesting noted at the right lower extremity. IMPRESSION: 1. No acute osseous abnormality about the right knee. 2. Advanced osteopenia. Electronically Signed   By: Jeannine Boga M.D.   On: 05/06/2021 19:29   DG Hip Unilat With Pelvis 2-3 Views Right  Result Date: 05/06/2021 CLINICAL DATA:  Initial evaluation for acute pain status post fall. EXAM: DG HIP (WITH OR WITHOUT PELVIS) 2-3V RIGHT COMPARISON:  None. FINDINGS: Acute comminuted intratrochanteric fracture of the right hip with superior subluxation. Femoral head remains normally position within the acetabulum. Femoral head height maintained. Remainder of the bony pelvis grossly intact. Underlying severe osteopenia. Moderate spondylosis noted within the lower lumbar spine. No visible soft tissue injury. Prominent vascular calcifications noted about the pelvis and proximal thighs. IMPRESSION: Acute comminuted intertrochanteric fracture of the right hip.  Electronically Signed   By: Jeannine Boga M.D.   On: 05/06/2021 19:25     Assessment/Plan   Principal Problem:   Closed right hip fracture (HCC) Active Problems:   Allergic rhinitis   Asthma   Hyperlipidemia   Type 2 diabetes mellitus (HCC)   CKD (chronic kidney disease), stage IV (HCC)   Chronic diastolic CHF (congestive heart failure) (HCC)   GERD (gastroesophageal reflux disease)   PAF (paroxysmal atrial fibrillation) (HCC)   Right hip pain      #) Acute right intertrochanteric hip fracture : confirmed via presenting plain films and stemming from ground level mechanical fall without associated loss of consciousness that occurred earlier on the day of admission, as further described above, resulting in  immediate develop of acute right hip pain.  At this time, the right lower extremity is neurovascularly intact, and the patient reports adequate pain control.  Currently anticoagulated on Eliquis in the setting of atrial fibrillation, with most recent dose occurring 05/06/2021; otherwise, no additional blood thinners, including no aspirin..   case and imaging were dicussed with the on-call orthopedic surgeon, Dr. Sammuel Hines, who recommended admission to the hospitalist service at [facility] for further evaluation and management of acute right hip fracture.  Dr. Sammuel Hines to formally consult, is aware that patient is on eliquis will most recent dose this AM. He will evaluate the patient in the morning, and anticipating taking patient to OR tomorrow afternoon (11/27) for definitive surgical management of right hip fx. He requests NPO after MN.   Gupta Score for this patient in the context of anticipated aforementioned orthopedic surgery conveys a 2.57% perioperative risk for significant cardiac event. No evidence to suggest acutely decompensated heart failure or acute MI. Consequently, no absolute contraindications to proceeding with proposed orthopedic surgery at this time.    Plan: Formal  orthopedic surgery consult for definitive surgical management, with plan to take patient to the OR tomorrow afternoon. NPO after MN in anticipation of this procedure.  No pharmacologic anticoagulation leading up to this anticipated surgery. SCD's. Prn IV Dilaudid. Anticipate postoperative PT consult. Pre-op EKG has been ordered. Check 25-hydroxy vit D level. Holding home eliquis.  Incentive spirometry.      #) Ground level mechanical fall: The patient reports a ground level mechanical fall earlier today in which pt tripped without any associated loss of consciousness. Appears to be purely mechanical in nature, without clinical evidence at this time to suggest contributory dizziness, presyncope, syncope, or acute ischemic CVA. Does not appear to have hit head as component of this witnessed fall.  presentation does not appear to be associated any acute neurologic deficits.  While this fall appears to be purely mechanical in nature, will also check urinalysis to evaluate for any underlying infectious contribution.   Plan: Check urinalysis, as above.  Repeat BMP and CBC with differential in the morning. Fall precautions. Anticipate postoperative PT consult.       #) Type 2 Diabetes Mellitus: documented history of such. Home insulin regimen: Lantus 24 units subcu every morning, with most recent dose this morning as well as Humalog 14 units subcu 3 times daily with meals.  Also q. weekly Trulicity injection. Uses continuous blood glucose monitoring as an outpatient, with most recent hemoglobin A1c noted to be 6.6% on 04/20/2021. presenting blood sugar 166. Given good glycemic control as an outpatient on the above regimen, with plan for NPO after MN, and with patient have already received her daily basal insulin earlier this morning, will hold basal insulin for now, with close monitoring of ensuing blood sugars as noted below.   Plan: Hold scheduled basal and short acting insulin for now.  In the setting of  n.p.o. midnight, have ordered Accu-Cheks every 6 hours with low-dose sliding scale insulin.  Hold home Trulicity while in the hospital.     #) Allergic rhinitis: Documented just recently, in the setting of seasonal allergies, cetirizine is an outpatient.  Plan: In the context of n.p.o. status during the night, cetirizine for now.      #) Moderate persistent asthma: Documented history of such, on scheduled Symbicort as well as as needed albuterol inhaler as an outpatient.  No evidence of active acute asthma exacerbation at this time.  Plan: Resume home respiratory  regimen.  Add on serum magnesium level.      #) Chronic diastolic heart failure: documented history of such, with most recent echocardiogram performed in April 2022 notable for LVEF 60 to 65% and grade 2 diastolic dysfunction, with additional details as conveyed above. No clinical evidence to suggest acutely decompensated heart failure at this time, while noting trace right pleural effusion on presenting chest x-ray. Patient's home diuretic regimen reportedly consists of the following: Prn Lasix.  In the context of this history, a clinical appearance of euvolemia at this time, discontinue the continuous normal saline started in the ED.    Plan: monitor strict I's & O's and daily weights. Repeat BMP in the morning. Check serum magnesium level.  Discontinue continuous NS, as above.       #) Paroxysmal atrial fibrillation: Documented history of such. In the setting of a CHA2DS2-VASc score of 7, there is an indication for the patient to be on chronic anticoagulation for thromboembolic prophylaxis. Consistent with this, the patient is chronically anticoagulated on Eliquis. Home AV nodal blocking regimen: Metoprolol succinate.  Additionally, she is on daily oral amiodarone, with most recent dose occurring this morning.  most recent echocardiogram in April 2022, as.  Preoperative EKG has been ordered , as above.    Plan: monitor  strict I's & O's and daily weights. Repeat BMP and CBC in the morning. Check serum magnesium level.  In setting of acute n.p.o. status, will hold beta-blocker/amiodarone for now, with plan to resume post-operative following tomorrow's definitive surgical management of presenting right hip fx., Eliquis in setting of anticipated right hip surgery.      #) GERD: Pepcid as an outpatient.  Plan: In setting of impending n.p.o. status, hold home Pepcid.  Recommend Protonix IV twice daily while NPO.      #) Hyperlipidemia:-History of such, on high intensity rosuvastatin as an outpatient.  Plan: will receive 1 dose of rosuvastatin (to initiation of n.p.o. at midnight, following which rosuvastatin has been held for the duration of her n.p.o. status.       #) stage IV chronic kidney disease: Associated baseline creatinine range of 2.2-2.5, with presenting serum creatinine consistent with this range, with presenting value of 2.25 down slightly from most recent prior value, as outlined above.  Monitor strict I's and O's and daily weights.  Tempt avoid nephrotoxic agents.  Add on serum magnesium level.  Repeat BMP in the morning.     DVT prophylaxis: SCD's   Code Status: Full code Family Communication: case discussed with the patient's husband, who is present at bedside Disposition Plan: Per Rounding Team Consults called: Dr. Sammuel Hines consulted, as further detailed above;  Admission status: inpatient; med-tele  Warrants inpatient status on basis of need for definitive surgical correction of presenting acute right hip fracture causing significant limitations the patient is ambulatory abilities relative to her baseline ambulatory/functional status.    PLEASE NOTE THAT DRAGON DICTATION SOFTWARE WAS USED IN THE CONSTRUCTION OF THIS NOTE.   Brumley DO [facility]  From [place]   05/06/2021, 8:12 PM

## 2021-05-06 NOTE — ED Notes (Signed)
Patient currently in radiology.

## 2021-05-06 NOTE — ED Triage Notes (Signed)
Pt bib EMS after a mechanical fall and landing on her right hip, she did not hit her head, but is on eliquis, she reports having 2-3 falls in the last 3 months with unknown reason, "I can't remember"

## 2021-05-07 ENCOUNTER — Inpatient Hospital Stay (HOSPITAL_COMMUNITY): Payer: Medicare Other

## 2021-05-07 ENCOUNTER — Encounter (HOSPITAL_COMMUNITY)
Admission: EM | Disposition: A | Discharge status: Skilled Nursing Facility | Payer: Self-pay | Source: Home / Self Care | Attending: Internal Medicine

## 2021-05-07 ENCOUNTER — Inpatient Hospital Stay (HOSPITAL_COMMUNITY): Payer: Medicare Other | Admitting: Certified Registered Nurse Anesthetist

## 2021-05-07 ENCOUNTER — Encounter (HOSPITAL_COMMUNITY): Payer: Self-pay | Admitting: Internal Medicine

## 2021-05-07 DIAGNOSIS — S72001A Fracture of unspecified part of neck of right femur, initial encounter for closed fracture: Secondary | ICD-10-CM | POA: Diagnosis not present

## 2021-05-07 DIAGNOSIS — S72144A Nondisplaced intertrochanteric fracture of right femur, initial encounter for closed fracture: Secondary | ICD-10-CM | POA: Diagnosis not present

## 2021-05-07 HISTORY — PX: INTRAMEDULLARY (IM) NAIL INTERTROCHANTERIC: SHX5875

## 2021-05-07 LAB — COMPREHENSIVE METABOLIC PANEL
ALT: 45 U/L — ABNORMAL HIGH (ref 0–44)
AST: 67 U/L — ABNORMAL HIGH (ref 15–41)
Albumin: 2.5 g/dL — ABNORMAL LOW (ref 3.5–5.0)
Alkaline Phosphatase: 58 U/L (ref 38–126)
Anion gap: 7 (ref 5–15)
BUN: 27 mg/dL — ABNORMAL HIGH (ref 8–23)
CO2: 24 mmol/L (ref 22–32)
Calcium: 8.7 mg/dL — ABNORMAL LOW (ref 8.9–10.3)
Chloride: 103 mmol/L (ref 98–111)
Creatinine, Ser: 2.33 mg/dL — ABNORMAL HIGH (ref 0.44–1.00)
GFR, Estimated: 21 mL/min — ABNORMAL LOW (ref >60)
Glucose, Bld: 155 mg/dL — ABNORMAL HIGH (ref 70–99)
Potassium: 5 mmol/L (ref 3.5–5.1)
Sodium: 134 mmol/L — ABNORMAL LOW (ref 135–145)
Total Bilirubin: 0.8 mg/dL (ref 0.3–1.2)
Total Protein: 5.3 g/dL — ABNORMAL LOW (ref 6.5–8.1)

## 2021-05-07 LAB — CBG MONITORING, ED
Glucose-Capillary: 136 mg/dL — ABNORMAL HIGH (ref 70–99)
Glucose-Capillary: 142 mg/dL — ABNORMAL HIGH (ref 70–99)
Glucose-Capillary: 144 mg/dL — ABNORMAL HIGH (ref 70–99)

## 2021-05-07 LAB — GLUCOSE, CAPILLARY
Glucose-Capillary: 142 mg/dL — ABNORMAL HIGH (ref 70–99)
Glucose-Capillary: 166 mg/dL — ABNORMAL HIGH (ref 70–99)
Glucose-Capillary: 197 mg/dL — ABNORMAL HIGH (ref 70–99)
Glucose-Capillary: 201 mg/dL — ABNORMAL HIGH (ref 70–99)

## 2021-05-07 LAB — CBC
HCT: 30 % — ABNORMAL LOW (ref 36.0–46.0)
Hemoglobin: 8.5 g/dL — ABNORMAL LOW (ref 12.0–15.0)
MCH: 28 pg (ref 26.0–34.0)
MCHC: 28.3 g/dL — ABNORMAL LOW (ref 30.0–36.0)
MCV: 98.7 fL (ref 80.0–100.0)
Platelets: 128 10*3/uL — ABNORMAL LOW (ref 150–400)
RBC: 3.04 MIL/uL — ABNORMAL LOW (ref 3.87–5.11)
RDW: 15.2 % (ref 11.5–15.5)
WBC: 9.3 10*3/uL (ref 4.0–10.5)
nRBC: 0 % (ref 0.0–0.2)

## 2021-05-07 LAB — VITAMIN D 25 HYDROXY (VIT D DEFICIENCY, FRACTURES): Vit D, 25-Hydroxy: 68.41 ng/mL (ref 30–100)

## 2021-05-07 LAB — MAGNESIUM: Magnesium: 1.9 mg/dL (ref 1.7–2.4)

## 2021-05-07 LAB — SURGICAL PCR SCREEN
MRSA, PCR: NEGATIVE
Staphylococcus aureus: NEGATIVE

## 2021-05-07 SURGERY — FIXATION, FRACTURE, INTERTROCHANTERIC, WITH INTRAMEDULLARY ROD
Anesthesia: General | Laterality: Right

## 2021-05-07 MED ORDER — SODIUM CHLORIDE 0.9 % IV SOLN
12.5000 mg | Freq: Four times a day (QID) | INTRAVENOUS | Status: DC | PRN
  Administered 2021-05-07: 16:00:00 12.5 mg via INTRAVENOUS
  Filled 2021-05-07: qty 12.5
  Filled 2021-05-07: qty 0.5

## 2021-05-07 MED ORDER — AMISULPRIDE (ANTIEMETIC) 5 MG/2ML IV SOLN: 10.0000 mg | Freq: Once | INTRAVENOUS | Status: DC | PRN

## 2021-05-07 MED ORDER — DULOXETINE HCL 20 MG PO CPEP
20.0000 mg | ORAL_CAPSULE | Freq: Every day | ORAL | Status: DC
  Administered 2021-05-07 – 2021-05-12 (×6): 20 mg via ORAL
  Filled 2021-05-07 (×7): qty 1

## 2021-05-07 MED ORDER — EPHEDRINE SULFATE 50 MG/ML IJ SOLN
INTRAMUSCULAR | Status: DC | PRN
  Administered 2021-05-07 (×2): 5 mg via INTRAVENOUS

## 2021-05-07 MED ORDER — PHENYLEPHRINE 40 MCG/ML (10ML) SYRINGE FOR IV PUSH (FOR BLOOD PRESSURE SUPPORT)
PREFILLED_SYRINGE | INTRAVENOUS | Status: DC | PRN
  Administered 2021-05-07: 200 ug via INTRAVENOUS
  Administered 2021-05-07: 160 ug via INTRAVENOUS
  Administered 2021-05-07: 200 ug via INTRAVENOUS

## 2021-05-07 MED ORDER — PHENYLEPHRINE 40 MCG/ML (10ML) SYRINGE FOR IV PUSH (FOR BLOOD PRESSURE SUPPORT)
PREFILLED_SYRINGE | INTRAVENOUS | Status: AC
  Filled 2021-05-07: qty 10

## 2021-05-07 MED ORDER — ZOLPIDEM TARTRATE 5 MG PO TABS: 7.5000 mg | ORAL_TABLET | Freq: Every evening | ORAL | Status: DC | PRN

## 2021-05-07 MED ORDER — ACETAMINOPHEN 10 MG/ML IV SOLN
INTRAVENOUS | Status: DC | PRN
  Administered 2021-05-07: 1000 mg via INTRAVENOUS

## 2021-05-07 MED ORDER — LIDOCAINE 2% (20 MG/ML) 5 ML SYRINGE
INTRAMUSCULAR | Status: DC | PRN
  Administered 2021-05-07: 60 mg via INTRAVENOUS

## 2021-05-07 MED ORDER — ONDANSETRON HCL 4 MG/2ML IJ SOLN
INTRAMUSCULAR | Status: AC
  Filled 2021-05-07: qty 2

## 2021-05-07 MED ORDER — ACETAMINOPHEN 10 MG/ML IV SOLN: 1000.0000 mg | Freq: Once | INTRAVENOUS | Status: DC | PRN

## 2021-05-07 MED ORDER — TRANEXAMIC ACID-NACL 1000-0.7 MG/100ML-% IV SOLN
1000.0000 mg | INTRAVENOUS | Status: AC
  Administered 2021-05-07: 11:00:00 1000 mg via INTRAVENOUS

## 2021-05-07 MED ORDER — METOPROLOL TARTRATE 12.5 MG HALF TABLET
12.5000 mg | ORAL_TABLET | Freq: Every day | ORAL | Status: DC
  Administered 2021-05-07 – 2021-05-12 (×6): 12.5 mg via ORAL
  Filled 2021-05-07 (×7): qty 1

## 2021-05-07 MED ORDER — LACTATED RINGERS IV SOLN: INTRAVENOUS | Status: DC

## 2021-05-07 MED ORDER — FAMOTIDINE 20 MG PO TABS: 20.0000 mg | ORAL_TABLET | Freq: Two times a day (BID) | ORAL | Status: DC

## 2021-05-07 MED ORDER — PHENYLEPHRINE HCL-NACL 20-0.9 MG/250ML-% IV SOLN
INTRAVENOUS | Status: DC | PRN
  Administered 2021-05-07: 50 ug/min via INTRAVENOUS

## 2021-05-07 MED ORDER — EZETIMIBE 10 MG PO TABS
10.0000 mg | ORAL_TABLET | Freq: Every day | ORAL | Status: DC
  Administered 2021-05-07 – 2021-05-12 (×6): 10 mg via ORAL
  Filled 2021-05-07 (×6): qty 1

## 2021-05-07 MED ORDER — ACETAMINOPHEN 10 MG/ML IV SOLN
INTRAVENOUS | Status: AC
  Filled 2021-05-07: qty 100

## 2021-05-07 MED ORDER — SUGAMMADEX SODIUM 200 MG/2ML IV SOLN
INTRAVENOUS | Status: DC | PRN
  Administered 2021-05-07: 200 mg via INTRAVENOUS

## 2021-05-07 MED ORDER — AMIODARONE HCL 200 MG PO TABS
200.0000 mg | ORAL_TABLET | Freq: Every day | ORAL | Status: DC
  Administered 2021-05-07 – 2021-05-12 (×6): 200 mg via ORAL
  Filled 2021-05-07 (×6): qty 1

## 2021-05-07 MED ORDER — FAMOTIDINE 20 MG PO TABS
20.0000 mg | ORAL_TABLET | Freq: Every day | ORAL | Status: DC
  Administered 2021-05-07 – 2021-05-12 (×6): 20 mg via ORAL
  Filled 2021-05-07 (×6): qty 1

## 2021-05-07 MED ORDER — PROPOFOL 10 MG/ML IV BOLUS
INTRAVENOUS | Status: DC | PRN
  Administered 2021-05-07: 130 mg via INTRAVENOUS

## 2021-05-07 MED ORDER — APIXABAN 2.5 MG PO TABS
2.5000 mg | ORAL_TABLET | Freq: Two times a day (BID) | ORAL | Status: DC
  Administered 2021-05-08 – 2021-05-12 (×9): 2.5 mg via ORAL
  Filled 2021-05-07 (×9): qty 1

## 2021-05-07 MED ORDER — FENTANYL CITRATE (PF) 250 MCG/5ML IJ SOLN
INTRAMUSCULAR | Status: DC | PRN
  Administered 2021-05-07 (×2): 50 ug via INTRAVENOUS

## 2021-05-07 MED ORDER — FENTANYL CITRATE (PF) 100 MCG/2ML IJ SOLN: 25.0000 ug | INTRAMUSCULAR | Status: DC | PRN

## 2021-05-07 MED ORDER — PROPOFOL 10 MG/ML IV BOLUS
INTRAVENOUS | Status: AC
  Filled 2021-05-07: qty 20

## 2021-05-07 MED ORDER — ORAL CARE MOUTH RINSE: 15.0000 mL | Freq: Once | OROMUCOSAL | Status: AC

## 2021-05-07 MED ORDER — CHLORHEXIDINE GLUCONATE 0.12 % MT SOLN: 15.0000 mL | Freq: Once | OROMUCOSAL | Status: AC

## 2021-05-07 MED ORDER — CEFAZOLIN SODIUM-DEXTROSE 2-4 GM/100ML-% IV SOLN
2.0000 g | INTRAVENOUS | Status: AC
  Administered 2021-05-07: 11:00:00 2 g via INTRAVENOUS

## 2021-05-07 MED ORDER — INSULIN ASPART 100 UNIT/ML IJ SOLN
10.0000 [IU] | Freq: Three times a day (TID) | INTRAMUSCULAR | Status: DC
  Administered 2021-05-07 – 2021-05-12 (×12): 10 [IU] via SUBCUTANEOUS

## 2021-05-07 MED ORDER — DEXAMETHASONE SODIUM PHOSPHATE 10 MG/ML IJ SOLN
INTRAMUSCULAR | Status: DC | PRN
  Administered 2021-05-07: 4 mg via INTRAVENOUS

## 2021-05-07 MED ORDER — FENTANYL CITRATE (PF) 250 MCG/5ML IJ SOLN
INTRAMUSCULAR | Status: AC
  Filled 2021-05-07: qty 5

## 2021-05-07 MED ORDER — CHLORHEXIDINE GLUCONATE 4 % EX LIQD: 60.0000 mL | Freq: Once | CUTANEOUS | Status: DC

## 2021-05-07 MED ORDER — CEFAZOLIN SODIUM-DEXTROSE 2-4 GM/100ML-% IV SOLN
INTRAVENOUS | Status: AC
  Filled 2021-05-07: qty 100

## 2021-05-07 MED ORDER — ALBUMIN HUMAN 5 % IV SOLN: INTRAVENOUS | Status: DC | PRN

## 2021-05-07 MED ORDER — INSULIN GLARGINE-YFGN 100 UNIT/ML ~~LOC~~ SOLN
24.0000 [IU] | Freq: Every day | SUBCUTANEOUS | Status: DC
  Administered 2021-05-07 – 2021-05-10 (×4): 24 [IU] via SUBCUTANEOUS
  Filled 2021-05-07 (×6): qty 0.24

## 2021-05-07 MED ORDER — CHLORHEXIDINE GLUCONATE 0.12 % MT SOLN
OROMUCOSAL | Status: AC
  Administered 2021-05-07: 09:00:00 15 mL via OROMUCOSAL
  Filled 2021-05-07: qty 15

## 2021-05-07 MED ORDER — ONDANSETRON HCL 4 MG/2ML IJ SOLN
INTRAMUSCULAR | Status: DC | PRN
  Administered 2021-05-07: 4 mg via INTRAVENOUS

## 2021-05-07 MED ORDER — ROCURONIUM BROMIDE 10 MG/ML (PF) SYRINGE
PREFILLED_SYRINGE | INTRAVENOUS | Status: DC | PRN
  Administered 2021-05-07: 60 mg via INTRAVENOUS

## 2021-05-07 MED ORDER — ZOLPIDEM TARTRATE 5 MG PO TABS
5.0000 mg | ORAL_TABLET | Freq: Every evening | ORAL | Status: DC | PRN
  Administered 2021-05-09: 5 mg via ORAL
  Filled 2021-05-07: qty 1

## 2021-05-07 MED ORDER — TRANEXAMIC ACID-NACL 1000-0.7 MG/100ML-% IV SOLN
INTRAVENOUS | Status: AC
  Filled 2021-05-07: qty 100

## 2021-05-07 SURGICAL SUPPLY — 47 items
BAG COUNTER SPONGE SURGICOUNT (BAG) ×2 IMPLANT
BIT DRILL 4.3MMS DISTAL GRDTED (BIT) IMPLANT
BLADE SURG 15 STRL LF DISP TIS (BLADE) ×1 IMPLANT
BLADE SURG 15 STRL SS (BLADE) ×2
BNDG GAUZE ELAST 4 BULKY (GAUZE/BANDAGES/DRESSINGS) ×2 IMPLANT
CHLORAPREP W/TINT 26 (MISCELLANEOUS) ×1 IMPLANT
COVER PERINEAL POST (MISCELLANEOUS) ×2 IMPLANT
COVER SURGICAL LIGHT HANDLE (MISCELLANEOUS) ×2 IMPLANT
DRAPE STERI IOBAN 125X83 (DRAPES) ×2 IMPLANT
DRILL 4.3MMS DISTAL GRADUATED (BIT) ×2
DRSG TEGADERM 2-3/8X2-3/4 SM (GAUZE/BANDAGES/DRESSINGS) ×1 IMPLANT
ELECT REM PT RETURN 9FT ADLT (ELECTROSURGICAL) ×2
ELECTRODE REM PT RTRN 9FT ADLT (ELECTROSURGICAL) ×1 IMPLANT
FACESHIELD WRAPAROUND (MASK) ×2 IMPLANT
FACESHIELD WRAPAROUND OR TEAM (MASK) ×1 IMPLANT
GAUZE SPONGE 4X4 12PLY STRL (GAUZE/BANDAGES/DRESSINGS) ×1 IMPLANT
GAUZE XEROFORM 5X9 LF (GAUZE/BANDAGES/DRESSINGS) ×2 IMPLANT
GLOVE SRG 8 PF TXTR STRL LF DI (GLOVE) ×1 IMPLANT
GLOVE SURG ENC MOIS LTX SZ8 (GLOVE) ×2 IMPLANT
GLOVE SURG ORTHO LTX SZ7.5 (GLOVE) ×2 IMPLANT
GLOVE SURG UNDER POLY LF SZ8 (GLOVE) ×2
GOWN STRL REUS W/ TWL LRG LVL3 (GOWN DISPOSABLE) ×1 IMPLANT
GOWN STRL REUS W/ TWL XL LVL3 (GOWN DISPOSABLE) ×2 IMPLANT
GOWN STRL REUS W/TWL LRG LVL3 (GOWN DISPOSABLE) ×2
GOWN STRL REUS W/TWL XL LVL3 (GOWN DISPOSABLE) ×4
GUIDEPIN 3.2X17.5 THRD DISP (PIN) ×1 IMPLANT
GUIDEWIRE BALL NOSE 100CM (WIRE) ×1 IMPLANT
HIP FRAC NAIL LAG SCR 10.5X100 (Orthopedic Implant) ×2 IMPLANT
KIT BASIN OR (CUSTOM PROCEDURE TRAY) ×2 IMPLANT
KIT TURNOVER KIT B (KITS) ×2 IMPLANT
NAIL HIP FRACTURE 11X380MM (Nail) ×1 IMPLANT
NS IRRIG 1000ML POUR BTL (IV SOLUTION) ×2 IMPLANT
PACK GENERAL/GYN (CUSTOM PROCEDURE TRAY) ×2 IMPLANT
PAD ARMBOARD 7.5X6 YLW CONV (MISCELLANEOUS) ×4 IMPLANT
PAD CAST 4YDX4 CTTN HI CHSV (CAST SUPPLIES) ×2 IMPLANT
PADDING CAST COTTON 4X4 STRL (CAST SUPPLIES) ×4
SCREW BONE CORTICAL 5.0X50 (Screw) ×1 IMPLANT
SCREW CANN THRD AFF 10.5X100 (Orthopedic Implant) IMPLANT
STAPLER VISISTAT 35W (STAPLE) ×2 IMPLANT
STRIP CLOSURE SKIN 1/2X4 (GAUZE/BANDAGES/DRESSINGS) ×1 IMPLANT
SUT VIC AB 0 CT1 27 (SUTURE) ×4
SUT VIC AB 0 CT1 27XBRD ANBCTR (SUTURE) ×2 IMPLANT
SUT VIC AB 2-0 CT1 27 (SUTURE) ×4
SUT VIC AB 2-0 CT1 TAPERPNT 27 (SUTURE) ×2 IMPLANT
TOWEL GREEN STERILE (TOWEL DISPOSABLE) ×2 IMPLANT
TOWEL GREEN STERILE FF (TOWEL DISPOSABLE) ×2 IMPLANT
WATER STERILE IRR 1000ML POUR (IV SOLUTION) ×2 IMPLANT

## 2021-05-07 NOTE — Plan of Care (Signed)

## 2021-05-07 NOTE — Consult Note (Signed)
ORTHOPAEDIC CONSULTATION  REQUESTING PHYSICIAN: Barb Merino, MD  Chief Complaint: right hip pain  HPI: Hannah [name] is a 75 y.o. female who presents with right hip pain after a fall at home.  She previously has been a Hydrographic surveyor.  She does use a walker when she leaves the house.  She is at the bedside with her son today.  Has a history of kidney disease which is stable.  Denies any other sites of pain or injury  Past Medical History:  Diagnosis Date   Abnormally small mouth    Allergic rhinitis 10/14/2009   Qualifier: Diagnosis of  By: Lamonte Sakai MD, Rose Fillers   Overview:  Overview:  Qualifier: Diagnosis of  By: Lamonte Sakai MD, Rose Fillers  Last Assessment & Plan:  Please continue Xyzal and Nasacort as you have been using them   Anemia    Asthma 05/12/2009   10/12/2014 p extensive coaching HFA effectiveness =    75% s spacer    Overview:  Overview:  10/12/2014 p extensive coaching HFA effectiveness =    75% s spacer   Last Assessment & Plan:  Please continue Symbicort 2 puffs twice a day. Remember to rinse and gargle after taking this medication. Take albuterol 2 puffs up to every 4 hours if needed for shortness of breath.  Follow with Dr Lamonte Sakai in 6 month   Bilateral carotid artery stenosis    a. mild 1-39% by duplex 10/2017, due 2021.   Breast cancer ([place]) 01/18/2016   right breast   CAD (coronary artery disease) 01/24/2011   a. s/p CABG 1998 with redo 2009.   Chemotherapy induced nausea and vomiting 02/24/2016   Chemotherapy-induced peripheral neuropathy ([place]) 04/27/2016   Chemotherapy-induced thrombocytopenia 04/06/2016   Chronic diastolic CHF (congestive heart failure) ([place]) 09/24/2013   CKD (chronic kidney disease), stage IV ([place]) 09/24/2013   Closed fracture of head of left humerus 82/42/3536   Complication of anesthesia    Dysrhythmia    a-fib   Gallstones    GERD (gastroesophageal reflux disease)    Glaucoma    H/O atrial tachycardia 05/12/2009   Qualifier: History of  By:  Lamonte Sakai MD, Rose Fillers   Overview:  Overview:  Qualifier: History of  By: Lamonte Sakai MD, Rose Fillers  Last Assessment & Plan:  There was no evidence of this on her cardiac monitor. I would recommend watchful waiting.    Heart murmur    History of COVID-19 07/2019   ASYMPTOMATIC   History of kidney stones    History of non-ST elevation myocardial infarction (NSTEMI)    Sept 2014--  thought to be type II HTN w/ LHC without infarct related artery and patent grafts   History of radiation therapy 05/24/16-07/26/16   right breast 50.4 Gy in 28 fractions, right breast boost 10 Gy in 5 fractions   Hyperlipidemia    Hypotension    Iron deficiency anemia    Mania ([place]) 03/05/2016   Moderate persistent asthma    pulmologist-  Dr. Malvin Johns   Osteomyelitis of toe of left foot ([place]) 04/06/2016   Osteopenia of multiple sites 10/19/2015   PAF (paroxysmal atrial fibrillation) ([place])    Personal history of chemotherapy 2017   Personal history of radiation therapy 2017   Pleural effusion on right 05/2019   RESOLVED   PONV (postoperative nausea and vomiting)    Psoriasis    right leg   Renal calculus, right    S/P AVR    prosthesis valve placement 2009  at same time re-do CABG   Seizure-like activity ([place]) 06/29/2016   EEG NORMAL, HEAD CT WITHOUT ACUTE FINDINGS   Sensorineural hearing loss (SNHL) of both ears 01/06/2016   Stroke ([place]) 2013   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   Syncope 09/06/2017   Type 2 diabetes mellitus ([place])    monitored by dr Dwyane Dee   Uses roller walker    Past Surgical History:  Procedure Laterality Date   [facility]AC[facility]  2009   [facility] same [facility] is [facility]   BREAST BIOPSY Right 02/12/2019   2 lymphnodes, 1 breast   BREAST LUMPECTOMY Right 01/18/2016   BREAST LUMPECTOMY WITH RADIOACTIVE SEED AND SENTINEL LYMPH NODE BIOPSY Right 01/18/2016   Procedure: RIGHT BREAST LUMPECTOMY WITH RADIOACTIVE SEED AND SENTINEL LYMPH NODE BIOPSY;   Surgeon: DAlphonsa Overall MD;  [facility]  Service: General;  Laterality: Right;   CARDIAC CATHETERIZATION  03/23/2008   Pre-redo CABG: L main OK, LAD (T), CFX (T), OM1 99%, RCA (T), LIMA-LAD OK, SVG-OM(?3) OK w/ little florw to OM2, SVG-RCA OK. EF NL   CARPAL TUNNEL RELEASE     CHEST TUBE INSERTION Right 06/26/2019   Procedure: INSERTION PLEURAL DRAINAGE CATHETER;  Surgeon: BGaye Pollack MD;  [facility];  Service: Thoracic;  Laterality: Right;   CHOLECYSTECTOMY N/A 07/07/2018   Procedure: LAPAROSCOPIC CHOLECYSTECTOMY WITH INTRAOPERATIVE CHOLANGIOGRAM ERAS PATHWAY;  Surgeon: NAlphonsa Overall MD;  [facility];  Service: General;  Laterality: N/A;   COLONOSCOPY     around 2015. Possibly with EAges&  re-do 2009   Had LIMA to DX/LAD, SVG to 2 marginal branches [facility] SVG to 3rd OM and PD at time of redo   CYSTOSCOPY W/ URETERAL STENT PLACEMENT Right 12/20/2014   Procedure: CYSTOSCOPY WITH RETROGRADE PYELOGRAM/URETERAL STENT PLACEMENT;  Surgeon: PCleon Gustin MD;  [facility]  Service: Urology;  Laterality: Right;   CYSTOSCOPY WITH RETROGRADE PYELOGRAM, URETEROSCOPY AND STENT PLACEMENT Left 08/21/2019   Procedure: CYSTOSCOPY WITH RETROGRADE PYELOGRAM, URETEROSCOPY AND STENT PLACEMENT;  Surgeon: MCleon Gustin MD;  [facility];  Service: Urology;  Laterality: Left;  1 HR   CYSTOSCOPY/URETEROSCOPY/HOLMIUM LASER/STENT PLACEMENT Right 04/04/2021   Procedure: CYSTOSCOPY/URETEROSCOPY/HOLMIUM LASER/STONE EXTRACTION AND STENT PLACEMENT;  Surgeon: NRemi Haggard MD;  [facility]  Service: Urology;  Laterality: Right;  1 HR   ESOPHAGOGASTRODUODENOSCOPY     many years ago per patient    ESOPHAGOGASTRODUODENOSCOPY ENDOSCOPY  06/17/2018   EYE SURGERY Bilateral    cataracts   HOLMIUM LASER APPLICATION Right 070/06/7492  Procedure:  HOLMIUM LASER LITHOTRIPSY;  Surgeon:  PCleon Gustin MD;  [facility]  Service: Urology;  Laterality: Right;   HOLMIUM LASER APPLICATION Left 049/67/5916  Procedure: HOLMIUM LASER APPLICATION;  Surgeon: MCleon Gustin MD;  [facility];  Service: Urology;  Laterality: Left;   IR KYPHO LUMBAR INC FX REDUCE BONE BX UNI/BIL CANNULATION INC/IMAGING  02/12/2020   IR THORACENTESIS ASP PLEURAL SPACE W/IMG GUIDE  10/03/2018   IR THORACENTESIS ASP PLEURAL SPACE W/IMG GUIDE  06/08/2019   LEFT HEART CATHETERIZATION WITH CORONARY/GRAFT ANGIOGRAM N/A 02/23/2013   Procedure: LEFT HEART CATHETERIZATION WITH CBeatrix Fetters  Surgeon: MBlane Ohara MD;  [facility];  Service: Cardiovascular;  Laterality: N/A;   LOOP RECORDER INSERTION N/A 08/30/2017   Procedure: LOOP  RECORDER INSERTION;  Surgeon: Evans Lance, MD;  Location: [place] [facility];  Service: Cardiovascular;  Laterality: N/A;   LOOP RECORDER REMOVAL N/A 08/10/2020   Procedure: LOOP RECORDER REMOVAL;  Surgeon: Evans Lance, MD;  Location: [place] [facility];  Service: Cardiovascular;  Laterality: N/A;   PACEMAKER IMPLANT N/A 08/10/2020   Procedure: PACEMAKER IMPLANT;  Surgeon: Evans Lance, MD;  Location: [place] [facility];  Service: Cardiovascular;  Laterality: N/A;   PORTACATH PLACEMENT Left 01/18/2016   Procedure: INSERTION PORT-A-CATH;  Surgeon: Alphonsa Overall, MD;  Location: [place];  Service: General;  Laterality: Left;   portacath removal  06/2016   REMOVAL OF PLEURAL DRAINAGE CATHETER Right 07/14/2019   Procedure: REMOVAL OF PLEURAL DRAINAGE CATHETER;  Surgeon: Gaye Pollack, MD;  Location: [place];  Service: Thoracic;  Laterality: Right;   TALC PLEURODESIS N/A 06/26/2019   Procedure: Pietro Cassis;  Surgeon: Gaye Pollack, MD;  [facility];  Service: Thoracic;  Laterality: N/A;   TONSILLECTOMY     TRANSTHORACIC ECHOCARDIOGRAM  02/24/2013   mild LVH,  ef 50-55%/  AV bioprosthesis was present with  very mild stenosis and no regurg., mean grandient 21mHg, peak grandient 242mg /  mild MR/  mild LAE and RAE/  moderate TR   TUBAL LIGATION  1983   Social History   Socioeconomic History   Marital status: Married    Spouse name: Not on file   Number of children: 2   Years of education: Not on file   Highest education level: Not on file  Occupational History   Occupation: retired  Tobacco Use   Smoking status: Never   Smokeless tobacco: Never  Vaping Use   Vaping Use: Never used  Substance and Sexual Activity   Alcohol use: No   Drug use: No   Sexual activity: Yes    Birth control/protection: None  Other Topics Concern   Not on file  Social History Narrative   Right handed    Lives at home with husband    Social Determinants of Health   Financial Resource Strain: Not on file  Food Insecurity: Not on file  Transportation Needs: Not on file  Physical Activity: Not on file  Stress: Not on file  Social Connections: Not on file   Family History  Problem Relation Age of Onset   Heart disease Father    Heart failure Father    Diabetes Maternal Grandmother    Heart disease Maternal Grandmother    Diabetes Son    Healthy Brother        #1   Heart attack Brother        #2   Heart disease Brother        #2   Colon cancer Neg Hx    Esophageal cancer Neg Hx    - negative except otherwise stated in the family history section Allergies  Allergen Reactions   Amoxicillin Rash and Other (See Comments)    Tolerates Cephalosporins Has patient had a PCN reaction causing immediate rash, facial/tongue/throat swelling, SOB or lightheadedness with hypotension: Yes Has patient had a PCN reaction causing severe rash involving mucus membranes or skin necrosis: Yes Has patient had a PCN reaction that required hospitalization No Has patient had a PCN reaction occurring within the last 10 years: No If all of the above answers are "NO", then may proceed with Cephalosporin use.     Tape Other (See Comments)    Pulls off skin, must  use paper tape   Aldactone [Spironolactone] Other (See Comments)    CKD/hypokalemia   Isosorbide Nitrate     Other reaction(s): fainting   K-Flex [Orphenadrine]     Other reaction(s): stomach pain   Arimidex [Anastrozole] Nausea Only   Latex Itching and Other (See Comments)    (Dentist office)   Tetracycline Rash   Prior to Admission medications   Medication Sig Start Date End Date Taking? Authorizing Provider  acetaminophen (TYLENOL) 500 MG tablet Take 1,000 mg by mouth as needed for moderate pain.   Yes [provider]  albuterol (PROVENTIL) (2.5 MG/3ML) 0.083% nebulizer solution Take 3 mLs (2.5 mg total) by nebulization every 6 (six) hours as needed for wheezing or shortness of breath. 06/09/20  Yes Parrett, Tammy S, NP  albuterol (VENTOLIN HFA) 108 (90 Base) MCG/ACT inhaler Inhale 2 puffs into the lungs every 6 (six) hours as needed for wheezing or shortness of breath. 10/18/20  Yes Collene Gobble, MD  amiodarone (PACERONE) 200 MG tablet TAKE ONE TABLET BY MOUTH DAILY Patient taking differently: Take 200 mg by mouth daily. 02/24/21  Yes Evans Lance, MD  apixaban (ELIQUIS) 2.5 MG TABS tablet Take 1 tablet (2.5 mg total) by mouth 2 (two) times daily. 04/05/21  Yes Remi Haggard, MD  cetirizine (ZYRTEC) 10 MG tablet Take 10 mg by mouth at bedtime.   Yes [provider]  Cholecalciferol (DIALYVITE VITAMIN D 5000) 125 MCG (5000 UT) capsule Take 5,000 Units by mouth daily.   Yes [provider]  DULoxetine (CYMBALTA) 20 MG capsule Take 1 capsule (20 mg total) by mouth daily. 03/17/21  Yes Nicholas Lose, MD  exemestane (AROMASIN) 25 MG tablet TAKE ONE TABLET BY MOUTH DAILY AFTER BREAKFAST Patient taking differently: 25 mg daily after breakfast. 09/06/20  Yes Nicholas Lose, MD  ezetimibe (ZETIA) 10 MG tablet Take 1 tablet (10 mg total) by mouth daily. 11/10/20  Yes Evans Lance, MD  famotidine (PEPCID) 20 MG  tablet TAKE ONE TABLET BY MOUTH TWICE DAILY. ONE AFTER SUPPER Patient taking differently: Take 20 mg by mouth 2 (two) times daily. 08/30/20  Yes Evans Lance, MD  furosemide (LASIX) 20 MG tablet Take 20 mg by mouth daily as needed for fluid.   Yes [provider]  insulin glargine (LANTUS) 100 UNIT/ML injection Inject 24 Units into the skin daily.   Yes [provider]  insulin lispro (HUMALOG) 100 UNIT/ML injection Inject 14 Units into the skin 3 (three) times daily before meals.   Yes [provider]  metoprolol succinate (TOPROL-XL) 25 MG 24 hr tablet TAKE ONE-HALF TABLET BY MOUTH EVERY DAY Patient taking differently: 12.5 mg daily. 04/10/21  Yes Larey Dresser, MD  nitroGLYCERIN (NITROSTAT) 0.4 MG SL tablet Place 1 tablet (0.4 mg total) under the tongue every 5 (five) minutes as needed for chest pain. 04/04/20 03/23/22 Yes [name], NP  rosuvastatin (CRESTOR) 40 MG tablet Take 1 tablet (40 mg total) by mouth at bedtime. 03/22/21  Yes Larey Dresser, MD  SYMBICORT 160-4.5 MCG/ACT inhaler INHALE TWO PUFFS INTO THE LUNGS TWICE DAILY Patient taking differently: 2 puffs in the morning and at bedtime. 11/16/20  Yes Byrum, Rose Fillers, MD  TRULICITY 1.5 UJ/8.1XB SOPN INJECT THE CONTENTS OF ONE  PEN SUBCUTANEOUSLY WEEKLY  AS DIRECTED Patient taking differently: Inject 1.5 mg into the skin once a week. 04/22/20  Yes Elayne Snare, MD  vitamin B-12 (CYANOCOBALAMIN) 100 MCG tablet Take 100 mcg by mouth  daily.   Yes [provider]  zolpidem (AMBIEN) 5 MG tablet Take 7.5 mg by mouth at bedtime as needed for sleep.   Yes [provider]  Accu-Chek FastClix Lancets MISC Use to check blood sugar 3 times per day 06/16/20   Elayne Snare, MD  ACCU-CHEK GUIDE test strip Use to test blood sugar 3 times daily 09/07/20   Elayne Snare, MD  Blood Glucose Monitoring Suppl (ACCU-CHEK GUIDE ME) w/Device KIT Use accu chek meter to check blood sugar three times daily. 04/10/19    Elayne Snare, MD  Continuous Blood Gluc Receiver (FREESTYLE LIBRE 2 READER) DEVI Use to check blood sugars daily 12/09/20   Elayne Snare, MD  Insulin Lispro-aabc (LYUMJEV) 100 UNIT/ML SOLN Inject 10 units before breakfast, 18 before lunch and 14 before dinner.  Replaces Humalog Patient not taking: Reported on 05/06/2021 04/28/21   Elayne Snare, MD  Insulin Pen Needle (BD PEN NEEDLE NANO U/F) 32G X 4 MM MISC USE AS INSTRUCTED TO INJECT INSULIN 4 TIMES DAILY. 09/16/19   Elayne Snare, MD  ondansetron (ZOFRAN ODT) 4 MG disintegrating tablet Take 1 tablet (4 mg total) by mouth every 8 (eight) hours as needed. Patient not taking: Reported on 05/06/2021 01/20/20   Isla Pence, MD   DG Chest 1 View  Result Date: 05/06/2021 CLINICAL DATA:  Initial evaluation for acute trauma, fall. EXAM: CHEST  1 VIEW COMPARISON:  Prior radiograph from 08/10/2020. FINDINGS: Left-sided pacemaker/AICD in place. Median sternotomy wires underlying surgical clips and CABG markers noted. Valvular replacement noted as well. Transverse heart size at the upper limits of normal, stable. Mediastinal silhouette within normal limits. Aortic atherosclerosis. Lungs normally inflated. Mild linear subsegmental atelectasis present at the right lung base. Blunting of the right costophrenic angle suggestive of a trace right pleural effusion. No pulmonary edema. No focal infiltrates. No pneumothorax. Osteopenia. No definite acute osseous abnormality. Advanced arthritic changes noted about the shoulders. IMPRESSION: 1. Trace right pleural effusion with associated mild right basilar subsegmental atelectasis. 2. No other active cardiopulmonary disease. 3.  Aortic Atherosclerosis (ICD10-I70.0). Electronically Signed   By: Jeannine Boga M.D.   On: 05/06/2021 19:27   DG Knee 1-2 Views Right  Result Date: 05/06/2021 CLINICAL DATA:  Initial evaluation for acute trauma, fall. EXAM: RIGHT KNEE - 1-2 VIEW COMPARISON:  None. FINDINGS: No acute fracture or  dislocation. No joint effusion. Mild osteoarthritic changes about the knee. Advanced osteopenia. No acute soft tissue abnormality. Prominent vascular calcifications noted. Surgical clips from presumed previous vessel harvesting noted at the right lower extremity. IMPRESSION: 1. No acute osseous abnormality about the right knee. 2. Advanced osteopenia. Electronically Signed   By: Jeannine Boga M.D.   On: 05/06/2021 19:29   DG Hip Unilat With Pelvis 2-3 Views Right  Result Date: 05/06/2021 CLINICAL DATA:  Initial evaluation for acute pain status post fall. EXAM: DG HIP (WITH OR WITHOUT PELVIS) 2-3V RIGHT COMPARISON:  None. FINDINGS: Acute comminuted intratrochanteric fracture of the right hip with superior subluxation. Femoral head remains normally position within the acetabulum. Femoral head height maintained. Remainder of the bony pelvis grossly intact. Underlying severe osteopenia. Moderate spondylosis noted within the lower lumbar spine. No visible soft tissue injury. Prominent vascular calcifications noted about the pelvis and proximal thighs. IMPRESSION: Acute comminuted intertrochanteric fracture of the right hip. Electronically Signed   By: Jeannine Boga M.D.   On: 05/06/2021 19:25     Positive ROS: All other systems have been reviewed and were otherwise negative with the exception  of those mentioned in the HPI and as above.  Physical Exam: General: No acute distress Cardiovascular: No pedal edema Respiratory: No cyanosis, no use of accessory musculature GI: No organomegaly, abdomen is soft and non-tender Skin: No lesions in the area of chief complaint Neurologic: Sensation intact distally Psychiatric: Patient is at baseline mood and affect Lymphatic: No axillary or cervical lymphadenopathy  MUSCULOSKELETAL:  Right hip is shortened externally rotated.  Sensation is intact in all distributions of right foot.  Fires tib Education administrator as well as EHL.  2+ dorsalis pedis  pulse  Independent Imaging Review: X-ray right hip 2 views: Displaced right intertrochanteric hip fracture  Assessment: 75 year old female with right displaced intertrochanteric hip fracture after a fall from standing.  Given her previous activity level, I would recommend that we fix this with intertrochanteric nail in order to optimize her mobility status.  We will plan to proceed with this.  Plan: Plan for right hip intertrochanteric nail   After a lengthy discussion of treatment options, including risks, benefits, alternatives, complications of surgical and nonsurgical conservative options, the patient elected surgical repair.   The patient  is aware of the material risks  and complications including, but not limited to injury to adjacent structures, neurovascular injury, infection, numbness, bleeding, implant failure, thermal burns, stiffness, persistent pain, failure to heal, disease transmission from allograft, need for further surgery, dislocation, anesthetic risks, blood clots, risks of death,and others. The probabilities of surgical success and failure discussed with patient given their particular co-morbidities.The time and nature of expected rehabilitation and recovery was discussed.The patient's questions were all answered preoperatively.  No barriers to understanding were noted. I explained the natural history of the disease process and Rx rationale.  I explained to the patient what I considered to be reasonable expectations given their personal situation.  The final treatment plan was arrived at through a shared patient decision making process model.   Thank you for the consult and the opportunity to see Ms. Hostetler  Vanetta Mulders, MD [facility] 7:52 AM

## 2021-05-07 NOTE — Anesthesia Procedure Notes (Addendum)
Procedure Name: Intubation Date/Time: 05/07/2021 10:42 AM Performed by: Inda Coke, CRNA Pre-anesthesia Checklist: Patient identified, Emergency Drugs available, Suction available and Patient being monitored Patient Re-evaluated:Patient Re-evaluated prior to induction Oxygen Delivery Method: Circle System Utilized Preoxygenation: Pre-oxygenation with 100% oxygen Induction Type: IV induction Ventilation: Mask ventilation without difficulty Laryngoscope Size: Mac and 3 Grade View: Grade II Tube type: Oral Tube size: 7.0 mm Number of attempts: 1 Airway Equipment and Method: Stylet and Oral airway Placement Confirmation: ETT inserted through vocal cords under direct vision, positive ETCO2 and breath sounds checked- equal and bilateral Secured at: 22 cm Tube secured with: Tape Dental Injury: Teeth and Oropharynx as per pre-operative assessment

## 2021-05-07 NOTE — Interval H&P Note (Signed)
History and Physical Interval Note:  05/07/2021 9:10 AM  Hannah [name]  has presented today for surgery, with the diagnosis of RIGHT INTERTROCH HIP FX.  The various methods of treatment have been discussed with the patient and family. After consideration of risks, benefits and other options for treatment, the patient has consented to  Procedure(s): INTRAMEDULLARY (IM) NAIL INTERTROCHANTRIC (Right) as a surgical intervention.  The patient's history has been reviewed, patient examined, no change in status, stable for surgery.  I have reviewed the patient's chart and labs.  Questions were answered to the patient's satisfaction.     Vanetta Mulders

## 2021-05-07 NOTE — Transfer of Care (Signed)
Immediate Anesthesia Transfer of Care Note  Patient: Hannah [name]  Procedure(s) Performed: INTRAMEDULLARY (IM) NAIL INTERTROCHANTRIC (Right)  Patient Location: PACU  Anesthesia Type:General  Level of Consciousness: awake, alert  and oriented  Airway & Oxygen Therapy: Patient Spontanous Breathing  Post-op Assessment: Report given to RN and Post -op Vital signs reviewed and stable  Post vital signs: Reviewed and stable  Last Vitals:  Vitals Value Taken Time  BP 120/43 05/07/21 1211  Temp    Pulse 76 05/07/21 1213  Resp 15 05/07/21 1213  SpO2 100 % 05/07/21 1213  Vitals shown include unvalidated device data.  Last Pain:  Vitals:   05/07/21 0920  TempSrc: Oral  PainSc:       Patients Stated Pain Goal: 3 (04/88/89 1694)  Complications: No notable events documented.

## 2021-05-07 NOTE — Discharge Instructions (Signed)
     Discharge Instructions    Attending Surgeon: Vanetta Mulders, MD Office Phone Number: [phone]   Diagnosis and Procedures:    Surgeries Performed: Right hip cephulomedullary nail  Discharge Plan:    Activity:  Weight bearing as tolerated  GENERAL INSTRUCTIONS: 1.  Keep your surgical site clean and dry, the dressings are waterproof   2. Please call Dr. Eddie Dibbles office at [phone] with questions Monday-Friday during business hours. If no one answers, please leave a message and someone should get back to the patient within 24 hours. For emergencies please call 911 or proceed to the emergency room.   3. Patient to notify surgical team if experiences any of the following: Bowel/Bladder dysfunction, uncontrolled pain, nerve/muscle weakness, incision with increased drainage or redness, nausea/vomiting and Fever greater than 101.0 F.  Be alert for signs of infection including redness, streaking, odor, fever or chills. Be alert for excessive pain or bleeding and notify your surgeon immediately.  WOUND INSTRUCTIONS:   Leave your dressing in place until your post operative visit.  Keep it clean and dry.  Always keep the incision clean and dry until the staples/sutures are removed. If there is no drainage from the incision you should keep it open to air. If there is drainage from the incision you must keep it covered at all times until the drainage stops  Do not soak in a bath tub, hot tub, pool, lake or other body of water until 21 days after your surgery and your incision is completely dry and healed.  If you have removable sutures (or staples) they must be removed 10-14 days (unless otherwise instructed) from the day of your surgery.     1)  Elevate the extremity as much as possible.  2)  Keep the dressing clean and dry.  3)  Please call us if the dressing becomes wet or dirty.  4)  If you are experiencing worsening pain or worsening swelling, please call.      MEDICATIONS: Resume all previous home medications at the previous prescribed dose and frequency unless otherwise noted Start taking the  pain medications on an as-needed basis as prescribed  Please taper down pain medication over the next week following surgery.  Ideally you should not require a refill of any narcotic pain medication.  Take pain medication with food to minimize nausea. In addition to the prescribed pain medication, you may take over-the-counter pain relievers such as Tylenol.  Do NOT take additional tylenol if your pain medication already has tylenol in it.  Resume usual Eliquis      FOLLOWUP INSTRUCTIONS: 1. Follow up at the [facility] Therapy [facility] 3-4 days following surgery. This appointment should be scheduled unless other arrangements have been made.The Physical Therapy scheduling number is [phone] if an appointment has not already been arranged.  2. Contact Dr. Eddie Dibbles office during office hours at [phone] or the practice after hours line at [phone] for non-emergencies. For medical emergencies call 911.   Discharge Location: Home

## 2021-05-07 NOTE — Anesthesia Postprocedure Evaluation (Signed)
Anesthesia Post Note  Patient: Hannah [name]  Procedure(s) Performed: INTRAMEDULLARY (IM) NAIL INTERTROCHANTRIC (Right)     Patient location during evaluation: PACU Anesthesia Type: General Level of consciousness: awake and alert Pain management: pain level controlled Vital Signs Assessment: post-procedure vital signs reviewed and stable Respiratory status: spontaneous breathing, nonlabored ventilation, respiratory function stable and patient connected to nasal cannula oxygen Cardiovascular status: blood pressure returned to baseline and stable Postop Assessment: no apparent nausea or vomiting Anesthetic complications: no   No notable events documented.  Last Vitals:  Vitals:   05/07/21 1441 05/07/21 1540  BP: (!) 154/95 (!) 146/57  Pulse: 71 71  Resp: 15 19  Temp: (!) 36.4 C 36.8 C  SpO2: 99% 100%    Last Pain:  Vitals:   05/07/21 1540  TempSrc: Oral  PainSc:                  March Rummage Linh Hedberg

## 2021-05-07 NOTE — Op Note (Signed)
Date of Surgery: 05/07/2021  INDICATIONS: Hannah [name] is a 75 y.o.-year-old female with right intertrochanteric hip fracture after a fall from standing.  The risk and benefits of the procedure with discussed in detail and documented in the pre-operative evaluation.  PREOPERATIVE DIAGNOSIS: 1. Right hip intertrochanteric hip fracture  POSTOPERATIVE DIAGNOSIS: Same.  PROCEDURE: 1. Right hip cephulomedullary nail  SURGEON: Yevonne Pax MD  ASSISTANT: Shirley Friar, ATC; necessary for the timely completion of procedure and due to complexity of procedure.  ANESTHESIA:  general  IV FLUIDS AND URINE: See anesthesia record.  ANTIBIOTICS: Ancef 2g  ESTIMATED BLOOD LOSS: 50 mL.  IMPLANTS:  Implant Name Type Inv. Item Serial No. Manufacturer Lot No. LRB No. Used Action  HIP FRAC NAIL LAG SCR 10.5X100 - [id number] Orthopedic Implant HIP FRAC NAIL LAG SCR 10.5X100  [employer]ON(ORTH,TRAU,BIO,SG) [id number] Right 1 Implanted  NAIL HIP FRACTURE 11X380MM - [id number] Nail NAIL HIP FRACTURE 11X380MM  [employer](ORTH,TRAU,BIO,SG) [id number] Right 1 Implanted  SCREW BONE CORTICAL 5.0X50 - [id number] Screw SCREW BONE CORTICAL 5.0X50  [employer]IM[employer]ON(ORTH,TRAU,BIO,SG) [id number] Right 1 Implanted    DRAINS: None  CULTURES: None  COMPLICATIONS: none  DESCRIPTION OF PROCEDURE:  Was identified in the preoperative holding area and the correct site was marked according to universal protocol with nursing.  She was subsequently taken back to the operating room.  Anesthesia was induced.  She was transferred over to the traction fracture table.  The boot was applied to the operative side and traction internal interpretation was placed which showed anatomic reduction of the fracture.  She was prepped and draped in usual sterile fashion.  A final timeout was held.  We started with our greater trochanteric approach with a 4 cm proximal incision posterior and proximal to the greater trochanter.  This was done with 15  blade.  The IT band was also incised.  The greater trochanter was identified.  A guidepin was placed under AP and lateral fluoroscopy with a greater trochanteric start point.  This was given to the level of the lesser trochanter.  Opening reamer was utilized.  Ball-tipped guidewire was then placed down to the distal metaphyseal bone in the distal femur.  A measuring guide was used to then measure this.  A size 13 reamer was then used down the medullary canal with anterior pressure on the hip to assist with reduction.  A size 11 nail was taken.  This was subsequently placed.  The guidepin for the femoral neck lag screw was then placed under AP and lateral fluoroscopy with care to be center center as best as possible.  This was measured off of the guide we subsequently drilled 100 and took 100 mm screw.  We were satisfied with the AP and lateral placement.  Subsequently turned attention to the distal interlocks.  Perfect circle technique was used to place 1 static interlock.  15 blade was used to incise through skin.  A snap was then used to close the IT band down to bone.  Again we subsequently drilled with the perfect circles technique and a depth gauge confirm the correct site screw.  This was placed with good purchase.  The screw was locked into place and backed off to allow for compression.  The jig was removed.  Final fluoroscopy was again showed anatomic reduction on AP and lateral fluoroscopy.  The wounds were thoroughly irrigated and closed in layers of 0 Vicryl 2-0 Vicryl and staples.  Dry sterile dressings were placed.  All counts were correct  in the case    Shirley Friar ATC was necessary for opening, closing, retracting, limb positioning and overall facilitation and timely completion of the procedure.     POSTOPERATIVE PLAN: The patient be weightbearing as tolerated on the right lower extremity.  She will work with physical therapy for mobility assessment.  I will see her back in 2 weeks for  follow-up to remove her staples.  She may resume her home Eliquis on postop day 1  Yevonne Pax, MD 11:58 AM

## 2021-05-07 NOTE — H&P (View-Only) (Signed)
ORTHOPAEDIC CONSULTATION  REQUESTING PHYSICIAN: Barb Merino, MD  Chief Complaint: right hip pain  HPI: Hannah [name] is a 75 y.o. female who presents with right hip pain after a fall at home.  She previously has been a Hydrographic surveyor.  She does use a walker when she leaves the house.  She is at the bedside with her son today.  Has a history of kidney disease which is stable.  Denies any other sites of pain or injury  Past Medical History:  Diagnosis Date   Abnormally small mouth    Allergic rhinitis 10/14/2009   Qualifier: Diagnosis of  By: Lamonte Sakai MD, Rose Fillers   Overview:  Overview:  Qualifier: Diagnosis of  By: Lamonte Sakai MD, Rose Fillers  Last Assessment & Plan:  Please continue Xyzal and Nasacort as you have been using them   Anemia    Asthma 05/12/2009   10/12/2014 p extensive coaching HFA effectiveness =    75% s spacer    Overview:  Overview:  10/12/2014 p extensive coaching HFA effectiveness =    75% s spacer   Last Assessment & Plan:  Please continue Symbicort 2 puffs twice a day. Remember to rinse and gargle after taking this medication. Take albuterol 2 puffs up to every 4 hours if needed for shortness of breath.  Follow with Dr Lamonte Sakai in 6 month   Bilateral carotid artery stenosis    a. mild 1-39% by duplex 10/2017, due 2021.   Breast cancer ([place]) 01/18/2016   right breast   CAD (coronary artery disease) 01/24/2011   a. s/p CABG 1998 with redo 2009.   Chemotherapy induced nausea and vomiting 02/24/2016   Chemotherapy-induced peripheral neuropathy ([place]) 04/27/2016   Chemotherapy-induced thrombocytopenia 04/06/2016   Chronic diastolic CHF (congestive heart failure) ([place]) 09/24/2013   CKD (chronic kidney disease), stage IV ([place]) 09/24/2013   Closed fracture of head of left humerus 88/04/314   Complication of anesthesia    Dysrhythmia    a-fib   Gallstones    GERD (gastroesophageal reflux disease)    Glaucoma    H/O atrial tachycardia 05/12/2009   Qualifier: History of  By:  Lamonte Sakai MD, Rose Fillers   Overview:  Overview:  Qualifier: History of  By: Lamonte Sakai MD, Rose Fillers  Last Assessment & Plan:  There was no evidence of this on her cardiac monitor. I would recommend watchful waiting.    Heart murmur    History of COVID-19 07/2019   ASYMPTOMATIC   History of kidney stones    History of non-ST elevation myocardial infarction (NSTEMI)    Sept 2014--  thought to be type II HTN w/ LHC without infarct related artery and patent grafts   History of radiation therapy 05/24/16-07/26/16   right breast 50.4 Gy in 28 fractions, right breast boost 10 Gy in 5 fractions   Hyperlipidemia    Hypotension    Iron deficiency anemia    Mania ([place]) 03/05/2016   Moderate persistent asthma    pulmologist-  Dr. Malvin Johns   Osteomyelitis of toe of left foot ([place]) 04/06/2016   Osteopenia of multiple sites 10/19/2015   PAF (paroxysmal atrial fibrillation) ([place])    Personal history of chemotherapy 2017   Personal history of radiation therapy 2017   Pleural effusion on right 05/2019   RESOLVED   PONV (postoperative nausea and vomiting)    Psoriasis    right leg   Renal calculus, right    S/P AVR    prosthesis valve placement 2009  at same time re-do CABG   Seizure-like activity ([place]) 06/29/2016   EEG NORMAL, HEAD CT WITHOUT ACUTE FINDINGS   Sensorineural hearing loss (SNHL) of both ears 01/06/2016   Stroke ([place]) 2013   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   Syncope 09/06/2017   Type 2 diabetes mellitus ([place])    monitored by dr Dwyane Dee   Uses roller walker    Past Surgical History:  Procedure Laterality Date   AORTIC VALVE REPLACEMENT  2009   [facility] (done same time is CABG)   BREAST BIOPSY Right 02/12/2019   2 lymphnodes, 1 breast   BREAST LUMPECTOMY Right 01/18/2016   BREAST LUMPECTOMY WITH RADIOACTIVE SEED AND SENTINEL LYMPH NODE BIOPSY Right 01/18/2016   Procedure: RIGHT BREAST LUMPECTOMY WITH RADIOACTIVE SEED AND SENTINEL LYMPH NODE BIOPSY;   Surgeon: DAlphonsa Overall MD;  [facility]  Service: General;  Laterality: Right;   CARDIAC CATHETERIZATION  03/23/2008   Pre-redo CABG: L main OK, LAD (T), CFX (T), OM1 99%, RCA (T), LIMA-LAD OK, SVG-OM(?3) OK w/ little florw to OM2, SVG-RCA OK. EF NL   CARPAL TUNNEL RELEASE     CHEST TUBE INSERTION Right 06/26/2019   Procedure: INSERTION PLEURAL DRAINAGE CATHETER;  Surgeon: BGaye Pollack MD;  [facility];  Service: Thoracic;  Laterality: Right;   CHOLECYSTECTOMY N/A 07/07/2018   Procedure: LAPAROSCOPIC CHOLECYSTECTOMY WITH INTRAOPERATIVE CHOLANGIOGRAM ERAS PATHWAY;  Surgeon: NAlphonsa Overall MD;  [facility];  Service: General;  Laterality: N/A;   COLONOSCOPY     around 2015. Possibly with ESidman&  re-do 2009   Had LIMA to DX/LAD, SV[facility] 2 marginal [facility]; SVG to 3rd OM and PD at time of redo   CYSTOSCOPY W/ URETERAL STENT PLACEMENT Right 12/20/2014   Procedure: CYSTOSCOPY WITH RETROGRADE PYELOGRAM/URETERAL STENT PLACEMENT;  Surgeon: PCleon Gustin MD;  [facility]  Service: Urology;  Laterality: Right;   CYSTOSCOPY WITH RETROGRADE PYELOGRAM, URETEROSCOPY AND STENT PLACEMENT Left 08/21/2019   Procedure: CYSTOSCOPY WITH RETROGRADE PYELOGRAM, URETEROSCOPY AND STENT PLACEMENT;  Surgeon: MCleon Gustin MD;  [facility];  Service: Urology;  Laterality: Left;  1 HR   CYSTOSCOPY/URETEROSCOPY/HOLMIUM LASER/STENT PLACEMENT Right 04/04/2021   Procedure: CYSTOSCOPY/URETEROSCOPY/HOLMIUM LASER/STONE EXTRACTION AND STENT PLACEMENT;  Surgeon: NRemi Haggard MD;  [facility]  Service: Urology;  Laterality: Right;  1 HR   ESOPHAGOGASTRODUODENOSCOPY     many years ago per patient    ESOPHAGOGASTRODUODENOSCOPY ENDOSCOPY  06/17/2018   EYE SURGERY Bilateral    cataracts   HOLMIUM LASER APPLICATION Right 095/28/4132  Procedure:  HOLMIUM LASER LITHOTRIPSY;  Surgeon:  PCleon Gustin MD;  [facility]  Service: Urology;  Laterality: Right;   HOLMIUM LASER APPLICATION Left 044/06/270  Procedure: HOLMIUM LASER APPLICATION;  Surgeon: MCleon Gustin MD;  [facility];  Service: Urology;  Laterality: Left;   IR KYPHO LUMBAR INC FX REDUCE BONE BX UNI/BIL CANNULATION INC/IMAGING  02/12/2020   IR THORACENTESIS ASP PLEURAL SPACE W/IMG GUIDE  10/03/2018   IR THORACENTESIS ASP PLEURAL SPACE W/IMG GUIDE  06/08/2019   LEFT HEART CATHETERIZATION WITH CORONARY/GRAFT ANGIOGRAM N/A 02/23/2013   Procedure: LEFT HEART CATHETERIZATION WITH CBeatrix Fetters  Surgeon: MBlane Ohara MD;  [facility];  Service: Cardiovascular;  Laterality: N/A;   LOOP RECORDER INSERTION N/A 08/30/2017   Procedure: LOOP  RECORDER INSERTION;  Surgeon: Evans Lance, MD;  Location: [place] [facility];  Service: Cardiovascular;  Laterality: N/A;   LOOP RECORDER REMOVAL N/A 08/10/2020   Procedure: LOOP RECORDER REMOVAL;  Surgeon: Evans Lance, MD;  Location: [place] [facility];  Service: Cardiovascular;  Laterality: N/A;   PACEMAKER IMPLANT N/A 08/10/2020   Procedure: PACEMAKER IMPLANT;  Surgeon: Evans Lance, MD;  Location: [place] [facility];  Service: Cardiovascular;  Laterality: N/A;   PORTACATH PLACEMENT Left 01/18/2016   Procedure: INSERTION PORT-A-CATH;  Surgeon: Alphonsa Overall, MD;  Location: [place];  Service: General;  Laterality: Left;   portacath removal  06/2016   REMOVAL OF PLEURAL DRAINAGE CATHETER Right 07/14/2019   Procedure: REMOVAL OF PLEURAL DRAINAGE CATHETER;  Surgeon: Gaye Pollack, MD;  Location: [place];  Service: Thoracic;  Laterality: Right;   TALC PLEURODESIS N/A 06/26/2019   Procedure: Pietro Cassis;  Surgeon: Gaye Pollack, MD;  [facility];  Service: Thoracic;  Laterality: N/A;   TONSILLECTOMY     TRANSTHORACIC ECHOCARDIOGRAM  02/24/2013   mild LVH,  ef 50-55%/  AV bioprosthesis was present with  very mild stenosis and no regurg., mean grandient 12mHg, peak grandient 261mg /  mild MR/  mild LAE and RAE/  moderate TR   TUBAL LIGATION  1983   Social History   Socioeconomic History   Marital status: Married    Spouse name: Not on file   Number of children: 2   Years of education: Not on file   Highest education level: Not on file  Occupational History   Occupation: retired  Tobacco Use   Smoking status: Never   Smokeless tobacco: Never  Vaping Use   Vaping Use: Never used  Substance and Sexual Activity   Alcohol use: No   Drug use: No   Sexual activity: Yes    Birth control/protection: None  Other Topics Concern   Not on file  Social History Narrative   Right handed    Lives at home with husband    Social Determinants of Health   Financial Resource Strain: Not on file  Food Insecurity: Not on file  Transportation Needs: Not on file  Physical Activity: Not on file  Stress: Not on file  Social Connections: Not on file   Family History  Problem Relation Age of Onset   Heart disease Father    Heart failure Father    Diabetes Maternal Grandmother    Heart disease Maternal Grandmother    Diabetes Son    Healthy Brother        #1   Heart attack Brother        #2   Heart disease Brother        #2   Colon cancer Neg Hx    Esophageal cancer Neg Hx    - negative except otherwise stated in the family history section Allergies  Allergen Reactions   Amoxicillin Rash and Other (See Comments)    Tolerates Cephalosporins Has patient had a PCN reaction causing immediate rash, facial/tongue/throat swelling, SOB or lightheadedness with hypotension: Yes Has patient had a PCN reaction causing severe rash involving mucus membranes or skin necrosis: Yes Has patient had a PCN reaction that required hospitalization No Has patient had a PCN reaction occurring within the last 10 years: No If all of the above answers are "NO", then may proceed with Cephalosporin use.     Tape Other (See Comments)    Pulls off skin, must  use paper tape   Aldactone [Spironolactone] Other (See Comments)    CKD/hypokalemia   Isosorbide Nitrate     Other reaction(s): fainting   K-Flex [Orphenadrine]     Other reaction(s): stomach pain   Arimidex [Anastrozole] Nausea Only   Latex Itching and Other (See Comments)    (Dentist office)   Tetracycline Rash   Prior to Admission medications   Medication Sig Start Date End Date Taking? Authorizing Provider  acetaminophen (TYLENOL) 500 MG tablet Take 1,000 mg by mouth as needed for moderate pain.   Yes [provider]  albuterol (PROVENTIL) (2.5 MG/3ML) 0.083% nebulizer solution Take 3 mLs (2.5 mg total) by nebulization every 6 (six) hours as needed for wheezing or shortness of breath. 06/09/20  Yes Parrett, Tammy S, NP  albuterol (VENTOLIN HFA) 108 (90 Base) MCG/ACT inhaler Inhale 2 puffs into the lungs every 6 (six) hours as needed for wheezing or shortness of breath. 10/18/20  Yes Collene Gobble, MD  amiodarone (PACERONE) 200 MG tablet TAKE ONE TABLET BY MOUTH DAILY Patient taking differently: Take 200 mg by mouth daily. 02/24/21  Yes Evans Lance, MD  apixaban (ELIQUIS) 2.5 MG TABS tablet Take 1 tablet (2.5 mg total) by mouth 2 (two) times daily. 04/05/21  Yes Remi Haggard, MD  cetirizine (ZYRTEC) 10 MG tablet Take 10 mg by mouth at bedtime.   Yes [provider]  Cholecalciferol (DIALYVITE VITAMIN D 5000) 125 MCG (5000 UT) capsule Take 5,000 Units by mouth daily.   Yes [provider]  DULoxetine (CYMBALTA) 20 MG capsule Take 1 capsule (20 mg total) by mouth daily. 03/17/21  Yes Nicholas Lose, MD  exemestane (AROMASIN) 25 MG tablet TAKE ONE TABLET BY MOUTH DAILY AFTER BREAKFAST Patient taking differently: 25 mg daily after breakfast. 09/06/20  Yes Nicholas Lose, MD  ezetimibe (ZETIA) 10 MG tablet Take 1 tablet (10 mg total) by mouth daily. 11/10/20  Yes Evans Lance, MD  famotidine (PEPCID) 20 MG  tablet TAKE ONE TABLET BY MOUTH TWICE DAILY. ONE AFTER SUPPER Patient taking differently: Take 20 mg by mouth 2 (two) times daily. 08/30/20  Yes Evans Lance, MD  furosemide (LASIX) 20 MG tablet Take 20 mg by mouth daily as needed for fluid.   Yes [provider]  insulin glargine (LANTUS) 100 UNIT/ML injection Inject 24 Units into the skin daily.   Yes [provider]  insulin lispro (HUMALOG) 100 UNIT/ML injection Inject 14 Units into the skin 3 (three) times daily before meals.   Yes [provider]  metoprolol succinate (TOPROL-XL) 25 MG 24 hr tablet TAKE ONE-HALF TABLET BY MOUTH EVERY DAY Patient taking differently: 12.5 mg daily. 04/10/21  Yes Larey Dresser, MD  nitroGLYCERIN (NITROSTAT) 0.4 MG SL tablet Place 1 tablet (0.4 mg total) under the tongue every 5 (five) minutes as needed for chest pain. 04/04/20 03/23/22 Yes [name], NP  rosuvastatin (CRESTOR) 40 MG tablet Take 1 tablet (40 mg total) by mouth at bedtime. 03/22/21  Yes Larey Dresser, MD  SYMBICORT 160-4.5 MCG/ACT inhaler INHALE TWO PUFFS INTO THE LUNGS TWICE DAILY Patient taking differently: 2 puffs in the morning and at bedtime. 11/16/20  Yes Byrum, Rose Fillers, MD  TRULICITY 1.5 EY/8.1KG SOPN INJECT THE CONTENTS OF ONE  PEN SUBCUTANEOUSLY WEEKLY  AS DIRECTED Patient taking differently: Inject 1.5 mg into the skin once a week. 04/22/20  Yes Elayne Snare, MD  vitamin B-12 (CYANOCOBALAMIN) 100 MCG tablet Take 100 mcg by mouth  daily.   Yes [provider]  zolpidem (AMBIEN) 5 MG tablet Take 7.5 mg by mouth at bedtime as needed for sleep.   Yes [provider]  Accu-Chek FastClix Lancets MISC Use to check blood sugar 3 times per day 06/16/20   Elayne Snare, MD  ACCU-CHEK GUIDE test strip Use to test blood sugar 3 times daily 09/07/20   Elayne Snare, MD  Blood Glucose Monitoring Suppl (ACCU-CHEK GUIDE ME) w/Device KIT Use accu chek meter to check blood sugar three times daily. 04/10/19    Elayne Snare, MD  Continuous Blood Gluc Receiver (FREESTYLE LIBRE 2 READER) DEVI Use to check blood sugars daily 12/09/20   Elayne Snare, MD  Insulin Lispro-aabc (LYUMJEV) 100 UNIT/ML SOLN Inject 10 units before breakfast, 18 before lunch and 14 before dinner.  Replaces Humalog Patient not taking: Reported on 05/06/2021 04/28/21   Elayne Snare, MD  Insulin Pen Needle (BD PEN NEEDLE NANO U/F) 32G X 4 MM MISC USE AS INSTRUCTED TO INJECT INSULIN 4 TIMES DAILY. 09/16/19   Elayne Snare, MD  ondansetron (ZOFRAN ODT) 4 MG disintegrating tablet Take 1 tablet (4 mg total) by mouth every 8 (eight) hours as needed. Patient not taking: Reported on 05/06/2021 01/20/20   Isla Pence, MD   DG Chest 1 View  Result Date: 05/06/2021 CLINICAL DATA:  Initial evaluation for acute trauma, fall. EXAM: CHEST  1 VIEW COMPARISON:  Prior radiograph from 08/10/2020. FINDINGS: Left-sided pacemaker/AICD in place. Median sternotomy wires underlying surgical clips and CABG markers noted. Valvular replacement noted as well. Transverse heart size at the upper limits of normal, stable. Mediastinal silhouette within normal limits. Aortic atherosclerosis. Lungs normally inflated. Mild linear subsegmental atelectasis present at the right lung base. Blunting of the right costophrenic angle suggestive of a trace right pleural effusion. No pulmonary edema. No focal infiltrates. No pneumothorax. Osteopenia. No definite acute osseous abnormality. Advanced arthritic changes noted about the shoulders. IMPRESSION: 1. Trace right pleural effusion with associated mild right basilar subsegmental atelectasis. 2. No other active cardiopulmonary disease. 3.  Aortic Atherosclerosis (ICD10-I70.0). Electronically Signed   By: Jeannine Boga M.D.   On: 05/06/2021 19:27   DG Knee 1-2 Views Right  Result Date: 05/06/2021 CLINICAL DATA:  Initial evaluation for acute trauma, fall. EXAM: RIGHT KNEE - 1-2 VIEW COMPARISON:  None. FINDINGS: No acute fracture or  dislocation. No joint effusion. Mild osteoarthritic changes about the knee. Advanced osteopenia. No acute soft tissue abnormality. Prominent vascular calcifications noted. Surgical clips from presumed previous vessel harvesting noted at the right lower extremity. IMPRESSION: 1. No acute osseous abnormality about the right knee. 2. Advanced osteopenia. Electronically Signed   By: Jeannine Boga M.D.   On: 05/06/2021 19:29   DG Hip Unilat With Pelvis 2-3 Views Right  Result Date: 05/06/2021 CLINICAL DATA:  Initial evaluation for acute pain status post fall. EXAM: DG HIP (WITH OR WITHOUT PELVIS) 2-3V RIGHT COMPARISON:  None. FINDINGS: Acute comminuted intratrochanteric fracture of the right hip with superior subluxation. Femoral head remains normally position within the acetabulum. Femoral head height maintained. Remainder of the bony pelvis grossly intact. Underlying severe osteopenia. Moderate spondylosis noted within the lower lumbar spine. No visible soft tissue injury. Prominent vascular calcifications noted about the pelvis and proximal thighs. IMPRESSION: Acute comminuted intertrochanteric fracture of the right hip. Electronically Signed   By: Jeannine Boga M.D.   On: 05/06/2021 19:25     Positive ROS: All other systems have been reviewed and were otherwise negative with the exception  of those mentioned in the HPI and as above.  Physical Exam: General: No acute distress Cardiovascular: No pedal edema Respiratory: No cyanosis, no use of accessory musculature GI: No organomegaly, abdomen is soft and non-tender Skin: No lesions in the area of chief complaint Neurologic: Sensation intact distally Psychiatric: Patient is at baseline mood and affect Lymphatic: No axillary or cervical lymphadenopathy  MUSCULOSKELETAL:  Right hip is shortened externally rotated.  Sensation is intact in all distributions of right foot.  Fires tib Education administrator as well as EHL.  2+ dorsalis pedis  pulse  Independent Imaging Review: X-ray right hip 2 views: Displaced right intertrochanteric hip fracture  Assessment: 75 year old female with right displaced intertrochanteric hip fracture after a fall from standing.  Given her previous activity level, I would recommend that we fix this with intertrochanteric nail in order to optimize her mobility status.  We will plan to proceed with this.  Plan: Plan for right hip intertrochanteric nail   After a lengthy discussion of treatment options, including risks, benefits, alternatives, complications of surgical and nonsurgical conservative options, the patient elected surgical repair.   The patient  is aware of the material risks  and complications including, but not limited to injury to adjacent structures, neurovascular injury, infection, numbness, bleeding, implant failure, thermal burns, stiffness, persistent pain, failure to heal, disease transmission from allograft, need for further surgery, dislocation, anesthetic risks, blood clots, risks of death,and others. The probabilities of surgical success and failure discussed with patient given their particular co-morbidities.The time and nature of expected rehabilitation and recovery was discussed.The patient's questions were all answered preoperatively.  No barriers to understanding were noted. I explained the natural history of the disease process and Rx rationale.  I explained to the patient what I considered to be reasonable expectations given their personal situation.  The final treatment plan was arrived at through a shared patient decision making process model.   Thank you for the consult and the opportunity to see Ms. Goertzen  Vanetta Mulders, MD [facility] 7:52 AM

## 2021-05-07 NOTE — Progress Notes (Signed)
PROGRESS NOTE    Hannah [name]  TMH:[id number] DOB: 1945/11/01 DOA: 05/06/2021 PCP: [name], DO    Brief Narrative:  75 year old with history of chronic diastolic heart failure and persistent A. fib on Eliquis, type 2 diabetes on insulin, moderate persistent asthma, stage IV chronic kidney disease with baseline creatinine about 2.2-2.5 presented with fall at home, right hip pain and found to have right intertrochanteric hip fracture.   Assessment & Plan:   Principal Problem:   Closed right hip fracture (HCC) Active Problems:   Allergic rhinitis   Asthma   Hyperlipidemia   Type 2 diabetes mellitus (HCC)   CKD (chronic kidney disease), stage IV (HCC)   Chronic diastolic CHF (congestive heart failure) (HCC)   GERD (gastroesophageal reflux disease)   PAF (paroxysmal atrial fibrillation) (HCC)   Right hip pain  Close traumatic right intertrochanteric hip fracture Status post ORIF, IM nailing Dr. Sammuel Hines 11/27.  Immediate postop. Adequate pain medications with IV and oral opiates. Weightbearing as tolerated. PT/OT DVT prophylaxis, will resume Eliquis 11/28 as per surgery plan. Anticipate discharge to skilled nursing facility after she works with therapies.  Diabetes type 2 with hyperglycemia: On insulin regimen at home.  Underwent surgery today, she will be resuming carb controlled diet and home doses of insulin tonight.  CKD stage IV: At about her baseline.  Chronic diastolic heart failure: Well compensated and euvolemic.  Paroxysmal A. fib: Rate controlled on metoprolol.  Continued metoprolol and amiodarone.  She is sinus rhythm.  Therapeutic on Eliquis.  Postoperatively planning to start on day 1.   DVT prophylaxis: SCDs Start: 05/06/21 2010   Code Status: Full code Family Communication: Husband at the bedside Disposition Plan: Status is: Inpatient  Remains inpatient appropriate because: Needing inpatient surgery.         Consultants:   Orthopedics  Procedures:  Right hip IM nail  Antimicrobials:  None   Subjective: I examined patient before she is going for surgery.  No overnight events.  She was looking forward for surgical procedure.  Pain is controlled with injectable pain medications.  Objective: Vitals:   05/07/21 0901 05/07/21 0920 05/07/21 1211 05/07/21 1226  BP: (!) 111/57 (!) 165/76 (!) 120/43 (!) 101/43  Pulse: 74 67 66 69  Resp: 19 18 13 20   Temp:  99.1 F (37.3 C) 97.9 F (36.6 C)   TempSrc:  Oral    SpO2: 100% 99% 100% 97%  Weight:      Height:        Intake/Output Summary (Last 24 hours) at 05/07/2021 1232 Last data filed at 05/07/2021 1152 Gross per 24 hour  Intake 1539.74 ml  Output 150 ml  Net 1389.74 ml   Filed Weights   05/06/21 1832  Weight: 63.5 kg    Examination:  General exam: Appears calm and comfortable at rest.  Moderate distress with pain on attempted mobility. Respiratory system: Clear to auscultation. Respiratory effort normal. Cardiovascular system: S1 & S2 heard, RRR. No JVD, murmurs, rubs, gallops or clicks. No pedal edema. Gastrointestinal system: Abdomen is nondistended, soft and nontender. No organomegaly or masses felt. Normal bowel sounds heard. Central nervous system: Alert and oriented. No focal neurological deficits. Extremities: Symmetric 5 x 5 power. Right lower extremity externally rotated.  Distal neurovascular status intact.    Data Reviewed: I have personally reviewed following labs and imaging studies  CBC: Recent Labs  Lab 05/06/21 1824 05/07/21 0529  WBC 6.2 9.3  NEUTROABS 4.6  --   HGB 10.5* 8.5*  HCT 35.2* 30.0*  MCV 96.4 98.7  PLT 129* 182*   Basic Metabolic Panel: Recent Labs  Lab 05/06/21 1824 05/06/21 2018 05/07/21 0529  NA 135  --  134*  K 4.2  --  5.0  CL 102  --  103  CO2 24  --  24  GLUCOSE 166*  --  155*  BUN 26*  --  27*  CREATININE 2.25*  --  2.33*  CALCIUM 9.3  --  8.7*  MG  --  2.0 1.9   GFR: Estimated  Creatinine Clearance: 18 mL/min (A) (by C-G formula based on SCr of 2.33 mg/dL (H)). Liver Function Tests: Recent Labs  Lab 05/07/21 0529  AST 67*  ALT 45*  ALKPHOS 58  BILITOT 0.8  PROT 5.3*  ALBUMIN 2.5*   No results for input(s): LIPASE, AMYLASE in the last 168 hours. No results for input(s): AMMONIA in the last 168 hours. Coagulation Profile: Recent Labs  Lab 05/06/21 1824  INR 1.3*   Cardiac Enzymes: No results for input(s): CKTOTAL, CKMB, CKMBINDEX, TROPONINI in the last 168 hours. BNP (last 3 results) No results for input(s): PROBNP in the last 8760 hours. HbA1C: No results for input(s): HGBA1C in the last 72 hours. CBG: Recent Labs  Lab 05/07/21 0026 05/07/21 0636 05/07/21 0857 05/07/21 1213  GLUCAP 142* 136* 144* 142*   Lipid Profile: No results for input(s): CHOL, HDL, LDLCALC, TRIG, CHOLHDL, LDLDIRECT in the last 72 hours. Thyroid Function Tests: No results for input(s): TSH, T4TOTAL, FREET4, T3FREE, THYROIDAB in the last 72 hours. Anemia Panel: No results for input(s): VITAMINB12, FOLATE, FERRITIN, TIBC, IRON, RETICCTPCT in the last 72 hours. Sepsis Labs: No results for input(s): PROCALCITON, LATICACIDVEN in the last 168 hours.  Recent Results (from the past 240 hour(s))  Resp Panel by RT-PCR (Flu A&B, Covid) Nasopharyngeal Swab     Status: None   Collection Time: 05/06/21  8:18 PM   Specimen: Nasopharyngeal Swab; Nasopharyngeal(NP) swabs in vial transport medium  Result Value Ref Range Status   SARS Coronavirus 2 by RT PCR NEGATIVE NEGATIVE Final    Comment: (NOTE) SARS-CoV-2 target nucleic acids are NOT DETECTED.  The SARS-CoV-2 RNA is generally detectable in upper respiratory specimens during the acute phase of infection. The lowest concentration of SARS-CoV-2 viral copies this assay can detect is 138 copies/mL. A negative result does not preclude SARS-Cov-2 infection and should not be used as the sole basis for treatment or other patient  management decisions. A negative result may occur with  improper specimen collection/handling, submission of specimen other than nasopharyngeal swab, presence of viral mutation(s) within the areas targeted by this assay, and inadequate number of viral copies(<138 copies/mL). A negative result must be combined with clinical observations, patient history, and epidemiological information. The expected result is Negative.  Fact Sheet for Patients:  EntrepreneurPulse.com.au  Fact Sheet for Healthcare Providers:  IncredibleEmployment.be  This test is no t yet approved or cleared by the [place] FDA and  has been authorized for detection and/or diagnosis of SARS-CoV-2 by FDA under an Emergency Use Authorization (EUA). This EUA will remain  in effect (meaning this test can be used) for the duration of the COVID-19 declaration under Section 564(b)(1) of the Act, 21 U.S.C.section 360bbb-3(b)(1), unless the authorization is terminated  or revoked sooner.       Influenza A by PCR NEGATIVE NEGATIVE Final   Influenza B by PCR NEGATIVE NEGATIVE Final    Comment: (NOTE) The Xpert Xpress SARS-CoV-2/FLU/RSV plus assay is  intended as an aid in the diagnosis of influenza from Nasopharyngeal swab specimens and should not be used as a sole basis for treatment. Nasal washings and aspirates are unacceptable for Xpert Xpress SARS-CoV-2/FLU/RSV testing.  Fact Sheet for Patients: EntrepreneurPulse.com.au  Fact Sheet for Healthcare Providers: IncredibleEmployment.be  This test is not yet approved or cleared by the [place] FDA and has been authorized for detection and/or diagnosis of SARS-CoV-2 by FDA under an Emergency Use Authorization (EUA). This EUA will remain in effect (meaning this test can be used) for the duration of the COVID-19 declaration under Section 564(b)(1) of the Act, 21 U.S.C. section 360bbb-3(b)(1),  unless the authorization is terminated or revoked.  Performed at [facility], [place] [address], [place], [place] [address]   Surgical pcr screen     Status: None   Collection Time: 05/07/21  9:28 AM   Specimen: Nasal Mucosa; Nasal Swab  Result Value Ref Range Status   MRSA, PCR NEGATIVE NEGATIVE Final   Staphylococcus aureus NEGATIVE NEGATIVE Final    Comment: (NOTE) The Xpert SA Assay (FDA approved for NASAL specimens in patients 49 years of age and older), is one component of a comprehensive surveillance program. It is not intended to diagnose infection nor to guide or monitor treatment. Performed at [facility], [place] [address], [place], [place] [address]          Radiology Studies: DG Chest 1 View  Result Date: 05/06/2021 CLINICAL DATA:  Initial evaluation for acute trauma, fall. EXAM: CHEST  1 VIEW COMPARISON:  Prior radiograph from 08/10/2020. FINDINGS: Left-sided pacemaker/AICD in place. Median sternotomy wires underlying surgical clips and CABG markers noted. Valvular replacement noted as well. Transverse heart size at the upper limits of normal, stable. Mediastinal silhouette within normal limits. Aortic atherosclerosis. Lungs normally inflated. Mild linear subsegmental atelectasis present at the right lung base. Blunting of the right costophrenic angle suggestive of a trace right pleural effusion. No pulmonary edema. No focal infiltrates. No pneumothorax. Osteopenia. No definite acute osseous abnormality. Advanced arthritic changes noted about the shoulders. IMPRESSION: 1. Trace right pleural effusion with associated mild right basilar subsegmental atelectasis. 2. No other active cardiopulmonary disease. 3.  Aortic Atherosclerosis (ICD10-I70.0). Electronically Signed   By: Jeannine Boga M.D.   On: 05/06/2021 19:27   DG Knee 1-2 Views Right  Result Date: 05/06/2021 CLINICAL DATA:  Initial evaluation for acute trauma, fall. EXAM: RIGHT KNEE - 1-2 VIEW  COMPARISON:  None. FINDINGS: No acute fracture or dislocation. No joint effusion. Mild osteoarthritic changes about the knee. Advanced osteopenia. No acute soft tissue abnormality. Prominent vascular calcifications noted. Surgical clips from presumed previous vessel harvesting noted at the right lower extremity. IMPRESSION: 1. No acute osseous abnormality about the right knee. 2. Advanced osteopenia. Electronically Signed   By: Jeannine Boga M.D.   On: 05/06/2021 19:29   DG C-Arm 1-60 Min-No Report  Result Date: 05/07/2021 Fluoroscopy was utilized by the requesting physician.  No radiographic interpretation.   DG C-Arm 1-60 Min-No Report  Result Date: 05/07/2021 Fluoroscopy was utilized by the requesting physician.  No radiographic interpretation.   DG Hip Unilat With Pelvis 2-3 Views Right  Result Date: 05/06/2021 CLINICAL DATA:  Initial evaluation for acute pain status post fall. EXAM: DG HIP (WITH OR WITHOUT PELVIS) 2-3V RIGHT COMPARISON:  None. FINDINGS: Acute comminuted intratrochanteric fracture of the right hip with superior subluxation. Femoral head remains normally position within the acetabulum. Femoral head height maintained. Remainder of the bony pelvis  grossly intact. Underlying severe osteopenia. Moderate spondylosis noted within the lower lumbar spine. No visible soft tissue injury. Prominent vascular calcifications noted about the pelvis and proximal thighs. IMPRESSION: Acute comminuted intertrochanteric fracture of the right hip. Electronically Signed   By: Jeannine Boga M.D.   On: 05/06/2021 19:25        Scheduled Meds:  chlorhexidine  60 mL Topical Once   [MAR Hold] insulin aspart  0-9 Units Subcutaneous Q6H   [MAR Hold] metoprolol tartrate  12.5 mg Oral Daily   [MAR Hold] mometasone-formoterol  2 puff Inhalation BID   [MAR Hold] pantoprazole (PROTONIX) IV  40 mg Intravenous Q12H   [MAR Hold] rosuvastatin  40 mg Oral QHS   Continuous Infusions:   acetaminophen     lactated ringers     [MAR Hold] promethazine (PHENERGAN) injection (IM or IVPB)       LOS: 1 day    Time spent: 30 minutes    Barb Merino, MD [facility] Pager [phone]

## 2021-05-07 NOTE — ED Notes (Signed)
External catheter checked.  No redness noted.  Patient appears dry around site.  Will continue to monitor

## 2021-05-07 NOTE — ED Notes (Signed)
Messaged Dr. Sloan Leiter about ordering pts metoprolol.

## 2021-05-07 NOTE — Anesthesia Preprocedure Evaluation (Addendum)
Anesthesia Evaluation  Patient identified by MRN, date of birth, ID band Patient awake    Reviewed: Allergy & Precautions, NPO status , Patient's Chart, lab work & pertinent test results  History of Anesthesia Complications (+) PONV  Airway Mallampati: II  TM Distance: >3 FB     Dental no notable dental hx.    Pulmonary asthma , COPD,  COPD inhaler,    Pulmonary exam normal        Cardiovascular hypertension, Pt. on medications and Pt. on home beta blockers + CAD, + CABG (1998, redo 2009), +CHF and + DOE  + dysrhythmias (on ELiquis ) Atrial Fibrillation + pacemaker  Rhythm:Irregular Rate:Normal     Neuro/Psych Seizures -, Well Controlled,  CVA (2013), Residual Symptoms negative psych ROS   GI/Hepatic GERD  ,  Endo/Other  diabetes, Type 2, Insulin Dependent, Oral Hypoglycemic Agents  Renal/GU CRFRenal disease  negative genitourinary   Musculoskeletal  (+) Arthritis , Osteoarthritis,  Right intertrochanteric fx   Abdominal Normal abdominal exam  (+)   Peds  Hematology  (+) anemia ,   Anesthesia Other Findings   Reproductive/Obstetrics                            Anesthesia Physical Anesthesia Plan  ASA: 3  Anesthesia Plan: General   Post-op Pain Management:    Induction: Intravenous  PONV Risk Score and Plan: 4 or greater and Ondansetron, Dexamethasone and Treatment may vary due to age or medical condition  Airway Management Planned: Mask and Oral ETT  Additional Equipment: None  Intra-op Plan:   Post-operative Plan: Extubation in OR  Informed Consent: I have reviewed the patients History and Physical, chart, labs and discussed the procedure including the risks, benefits and alternatives for the proposed anesthesia with the patient or authorized representative who has indicated his/her understanding and acceptance.     Dental advisory given  Plan Discussed with:  CRNA  Anesthesia Plan Comments: (ECHO 04/22: 1. Left ventricular ejection fraction, by estimation, is 60 to 65%. The  left ventricle has normal function. There is mild concentric left  ventricular hypertrophy. Left ventricular diastolic parameters are  consistent with Grade II diastolic dysfunction  (pseudonormalization).  2. Mild mitral valve regurgitation. Moderate mitral annular  calcification.  3. The aortic valve has been repaired/replaced. 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 Procedure Date:  2009. Aortic valve area, by VTI measures 1.23 cm. Aortic valve mean  gradient measures 11.0 mmHg. Aortic  valve Vmax measures 2.31 m/s.  4. There is normal pulmonary artery systolic pressure.   Lab Results      Component                Value               Date                      WBC                      9.3                 05/07/2021                HGB                      8.5 (L)  05/07/2021                HCT                      30.0 (L)            05/07/2021                MCV                      98.7                05/07/2021                PLT                      128 (L)             05/07/2021           Lab Results      Component                Value               Date                      NA                       134 (L)             05/07/2021                K                        5.0                 05/07/2021                CO2                      24                  05/07/2021                GLUCOSE                  155 (H)             05/07/2021                BUN                      27 (H)              05/07/2021                CREATININE               2.33 (H)            05/07/2021                CALCIUM                  8.7 (L)             05/07/2021                EGFR  42 (L)              04/27/2016                GFRNONAA                 21 (L)              05/07/2021          )        Anesthesia Quick Evaluation

## 2021-05-07 NOTE — Brief Op Note (Signed)
   Brief Op Note  Date of Surgery: 05/07/2021  Preoperative Diagnosis: RIGHT INTERTROCH HIP FX  Postoperative Diagnosis: same  Procedure: Procedure(s): INTRAMEDULLARY (IM) NAIL INTERTROCHANTRIC  Implants: Implant Name Type Inv. Item Serial No. Manufacturer Lot No. LRB No. Used Action  HIP FRAC NAIL LAG SCR 10.5X100 - [id number] Orthopedic Implant HIP FRAC NAIL LAG SCR 10.5X100  [employer](ORTH,TRAU,BIO,SG) [id number] Right 1 Implanted  NAIL HIP FRACTURE 11X380MM - [id number] Nail NAIL HIP FRACTURE 11X380MM  [employer](ORTH,TRAU,BIO,SG) [id number] Right 1 Implanted  SCREW BONE CORTICAL 5.0X50 - [id number] Screw SCREW BONE CORTICAL 5.0X50  [employer](ORTH,TRAU,BIO,SG) [id number] Right 1 Implanted    Surgeons: Surgeon(s): Vanetta Mulders, MD  Anesthesia: General    Estimated Blood Loss: See anesthesia record  Complications: None  Condition to PACU: Stable  Yevonne Pax, MD 05/07/2021 11:58 AM

## 2021-05-08 ENCOUNTER — Encounter (HOSPITAL_COMMUNITY): Payer: Self-pay | Admitting: Orthopaedic Surgery

## 2021-05-08 LAB — BASIC METABOLIC PANEL
Anion gap: 9 (ref 5–15)
BUN: 43 mg/dL — ABNORMAL HIGH (ref 8–23)
CO2: 23 mmol/L (ref 22–32)
Calcium: 8.5 mg/dL — ABNORMAL LOW (ref 8.9–10.3)
Chloride: 103 mmol/L (ref 98–111)
Creatinine, Ser: 3.49 mg/dL — ABNORMAL HIGH (ref 0.44–1.00)
GFR, Estimated: 13 mL/min — ABNORMAL LOW (ref >60)
Glucose, Bld: 202 mg/dL — ABNORMAL HIGH (ref 70–99)
Potassium: 5.3 mmol/L — ABNORMAL HIGH (ref 3.5–5.1)
Sodium: 135 mmol/L (ref 135–145)

## 2021-05-08 LAB — CBC WITH DIFFERENTIAL/PLATELET
Abs Immature Granulocytes: 0.06 10*3/uL (ref 0.00–0.07)
Basophils Absolute: 0 10*3/uL (ref 0.0–0.1)
Basophils Relative: 0 %
Eosinophils Absolute: 0 10*3/uL (ref 0.0–0.5)
Eosinophils Relative: 0 %
HCT: 23.8 % — ABNORMAL LOW (ref 36.0–46.0)
Hemoglobin: 7 g/dL — ABNORMAL LOW (ref 12.0–15.0)
Immature Granulocytes: 0 %
Lymphocytes Relative: 4 %
Lymphs Abs: 0.6 10*3/uL — ABNORMAL LOW (ref 0.7–4.0)
MCH: 28.9 pg (ref 26.0–34.0)
MCHC: 29.4 g/dL — ABNORMAL LOW (ref 30.0–36.0)
MCV: 98.3 fL (ref 80.0–100.0)
Monocytes Absolute: 1.4 10*3/uL — ABNORMAL HIGH (ref 0.1–1.0)
Monocytes Relative: 10 %
Neutro Abs: 11.5 10*3/uL — ABNORMAL HIGH (ref 1.7–7.7)
Neutrophils Relative %: 86 %
Platelets: 127 10*3/uL — ABNORMAL LOW (ref 150–400)
RBC: 2.42 MIL/uL — ABNORMAL LOW (ref 3.87–5.11)
RDW: 15.9 % — ABNORMAL HIGH (ref 11.5–15.5)
WBC: 13.5 10*3/uL — ABNORMAL HIGH (ref 4.0–10.5)
nRBC: 0 % (ref 0.0–0.2)

## 2021-05-08 LAB — GLUCOSE, CAPILLARY
Glucose-Capillary: 169 mg/dL — ABNORMAL HIGH (ref 70–99)
Glucose-Capillary: 185 mg/dL — ABNORMAL HIGH (ref 70–99)
Glucose-Capillary: 184 mg/dL — ABNORMAL HIGH (ref 70–99)
Glucose-Capillary: 90 mg/dL (ref 70–99)

## 2021-05-08 LAB — PREPARE RBC (CROSSMATCH)

## 2021-05-08 LAB — MAGNESIUM: Magnesium: 2 mg/dL (ref 1.7–2.4)

## 2021-05-08 MED ORDER — SODIUM CHLORIDE 0.9% IV SOLUTION: Freq: Once | INTRAVENOUS | Status: AC

## 2021-05-08 MED ORDER — LIP MEDEX EX OINT
TOPICAL_OINTMENT | CUTANEOUS | Status: DC | PRN
  Administered 2021-05-09: 75 via TOPICAL
  Filled 2021-05-08: qty 7

## 2021-05-08 MED ORDER — SODIUM CHLORIDE 0.9 % IV SOLN: INTRAVENOUS | Status: DC

## 2021-05-08 NOTE — Evaluation (Signed)
Occupational Therapy Evaluation Patient Details Name: Hannah [name] MRN: [id number] DOB: Jan 10, 1946 Today's Date: 05/08/2021   History of Present Illness Pt is a 75y/o female admitted 11/26 with R intertrochanteric hip fracture after fall from standing.  S/P ORIF IM nail 11/27, WBAT.  PMH includes: CHF, afib, DM, asthma, stage IV CKD, breast CA, glaucoma.   Clinical Impression   PTA patient reports independent with ADLs, mobility (using rollator in community), spouse assisting with IADLs.  Admitted for above and limited by problem list below, including generalized weakness, pain in R LE, impaired balance and decreased activity tolerance.  Pt requires max assist +2 for bed mobility, transfers and up to total assist +2 for ADLs.  She presents with heavy R lateral lean at EOB, posterior lean in standing requiring max assist +2 statically.  She will benefit from continued OT services while admitted and after dc at SNF level to optimize independence, safety with ADLs and mobility to decrease burden of care.  Will follow acutely.      Recommendations for follow up therapy are one component of a multi-disciplinary discharge planning process, led by the attending physician.  Recommendations may be updated based on patient status, additional functional criteria and insurance authorization.   Follow Up Recommendations  Skilled nursing-short term rehab (<3 hours/day)    Assistance Recommended at Discharge Frequent or constant Supervision/Assistance  Functional Status Assessment  Patient has had a recent decline in their functional status and demonstrates the ability to make significant improvements in function in a reasonable and predictable amount of time.  Equipment Recommendations   (TBD at next venue of care)    Recommendations for Other Services       Precautions / Restrictions Precautions Precautions: Fall Restrictions Weight Bearing Restrictions: Yes RLE Weight Bearing: Weight bearing as  tolerated      Mobility Bed Mobility Overal bed mobility: Needs Assistance Bed Mobility: Supine to Sit;Sit to Supine     Supine to sit: Max assist;+2 for physical assistance;+2 for safety/equipment Sit to supine: Max assist;+2 for physical assistance;+2 for safety/equipment   General bed mobility comments: using bed pad and max assist +2 to pivot towards EOB, scoot foward    Transfers Overall transfer level: Needs assistance Equipment used: Rolling walker (2 wheels) Transfers: Sit to/from Stand Sit to Stand: Max assist;+2 physical assistance;+2 safety/equipment           General transfer comment: max +2 to power up and steady pt pulling on RW, posterior lean      Balance Overall balance assessment: Needs assistance Sitting-balance support: Bilateral upper extremity supported;Feet supported Sitting balance-Leahy Scale: Poor Sitting balance - Comments: R lateral lean, able to correct with cueing but unable to maintain Postural control: Right lateral lean;Posterior lean Standing balance support: Bilateral upper extremity supported;During functional activity;Reliant on assistive device for balance Standing balance-Leahy Scale: Zero Standing balance comment: +2 max assist                           ADL either performed or assessed with clinical judgement   ADL Overall ADL's : Needs assistance/impaired     Grooming: Set up;Sitting;Bed level Grooming Details (indicate cue type and reason): sitting supported, unsupported requires up to max assist to maintain balance         Upper Body Dressing : Minimal assistance;Sitting   Lower Body Dressing: Total assistance;+2 for physical assistance;+2 for safety/equipment;Sit to/from Health and safety inspector Details (indicate cue type  and reason): unable         Functional mobility during ADLs: Maximal assistance;+2 for physical assistance;+2 for safety/equipment;Rolling walker (2 wheels) General ADL Comments: pt  limited  by pain, balance, weakness     Vision         Perception     Praxis      Pertinent Vitals/Pain Pain Assessment: Faces Faces Pain Scale: Hurts whole lot Pain Location: R LE with movement Pain Descriptors / Indicators: Discomfort;Grimacing;Operative site guarding Pain Intervention(s): Limited activity within patient's tolerance;Repositioned;Monitored during session     Hand Dominance Right   Extremity/Trunk Assessment Upper Extremity Assessment Upper Extremity Assessment: Generalized weakness   Lower Extremity Assessment Lower Extremity Assessment: Defer to PT evaluation (s/p R ORIF)       Communication Communication Communication: HOH (hears out of L ear only)   Cognition Arousal/Alertness: Awake/alert Behavior During Therapy: WFL for tasks assessed/performed Overall Cognitive Status: Impaired/Different from baseline Area of Impairment: Awareness;Problem solving;Memory;Safety/judgement                     Memory: Decreased short-term memory   Safety/Judgement: Decreased awareness of safety;Decreased awareness of deficits Awareness: Emergent Problem Solving: Slow processing;Difficulty sequencing;Requires verbal cues;Decreased initiation       General Comments  spouse present and supportive    Exercises     Shoulder Instructions      Home Living Family/patient expects to be discharged to:: Private residence Living Arrangements: Spouse/significant other Available Help at Discharge: Family;Available 24 hours/day Type of Home: House Home Access: Ramped entrance     Home Layout: One level     Bathroom Shower/Tub: Teacher, early years/pre: Handicapped height     Home Equipment: Air cabin crew (4 wheels);Rolling Walker (2 wheels)          Prior Functioning/Environment Prior Level of Function : Independent/Modified Independent;History of Falls (last six months)             Mobility Comments: used rollator outside  of the home, no AD in home ADLs Comments: independent ADLs, spouse supervises showers and assists with IADLS        OT Problem List: Decreased strength;Decreased activity tolerance;Impaired balance (sitting and/or standing);Decreased safety awareness;Decreased cognition;Decreased knowledge of use of DME or AE;Decreased knowledge of precautions;Pain      OT Treatment/Interventions: Self-care/ADL training;Therapeutic exercise;DME and/or AE instruction;Therapeutic activities;Patient/family education;Balance training    OT Goals(Current goals can be found in the care plan section) Acute Rehab OT Goals Patient Stated Goal: get better to get to my granddaughters wedding in april OT Goal Formulation: With patient Time For Goal Achievement: 05/22/21 Potential to Achieve Goals: Good  OT Frequency: Min 2X/week   Barriers to D/C:            Co-evaluation PT/OT/SLP Co-Evaluation/Treatment: Yes Reason for Co-Treatment: For patient/therapist safety;To address functional/ADL transfers   OT goals addressed during session: ADL's and self-care      AM-PAC OT "6 Clicks" Daily Activity     Outcome Measure Help from another person eating meals?: None Help from another person taking care of personal grooming?: A Little Help from another person toileting, which includes using toliet, bedpan, or urinal?: Total Help from another person bathing (including washing, rinsing, drying)?: A Lot Help from another person to put on and taking off regular upper body clothing?: A Little Help from another person to put on and taking off regular lower body clothing?: Total 6 Click Score: 14   End of Session Equipment Utilized During  Treatment: Rolling walker (2 wheels);Gait belt Nurse Communication: Mobility status  Activity Tolerance: Patient limited by pain Patient left: in bed;with call bell/phone within reach;with bed alarm set;with family/visitor present  OT Visit Diagnosis: Other abnormalities of gait  and mobility (R26.89);Muscle weakness (generalized) (M62.81);History of falling (Z91.81)                Time: 1205-1229 OT Time Calculation (min): 24 min Charges:  OT General Charges $OT Visit: 1 Visit OT Evaluation $OT Eval Moderate Complexity: 1 Mod  Jolaine Artist, OT Acute Rehabilitation Services Pager [phone] Office [phone]   Delight Stare 05/08/2021, 12:52 PM

## 2021-05-08 NOTE — Plan of Care (Signed)

## 2021-05-08 NOTE — Progress Notes (Signed)
PT Evaluation Note   05/08/21 1315  PT Visit Information  Last PT Received On 05/08/21  Assistance Needed +2  PT/OT/SLP Co-Evaluation/Treatment Yes  Reason for Co-Treatment To address functional/ADL transfers;For patient/therapist safety  PT goals addressed during session Mobility/safety with mobility;Balance  History of Present Illness Pt is a 75y/o female admitted 11/26 with R intertrochanteric hip fracture after fall from standing.  S/P ORIF IM nail 11/27, WBAT.  PMH includes: CHF, afib, DM, asthma, stage IV CKD, breast CA, glaucoma.  Precautions  Precautions Fall  Restrictions  Weight Bearing Restrictions Yes  RLE Weight Bearing WBAT  Home Living  Family/patient expects to be discharged to: Private residence  Living Arrangements Spouse/significant other  Available Help at Discharge Family;Available 24 hours/day  Type of [place] One level  Bathroom Shower/Tub Tub/shower unit  [place] (4 wheels);Rolling Walker (2 wheels)  Prior Function  Prior Level of Function  Independent/Modified Independent;History of Falls (last six months)  Mobility Comments used rollator outside of the home, no AD in home  ADLs Comments independent ADLs, spouse supervises showers and assists with IADLS  Communication  Communication HOH (hears out of L ear only)  Pain Assessment  Pain Assessment Faces  Faces Pain Scale 8  Pain Location R LE with movement  Pain Descriptors / Indicators Discomfort;Grimacing;Operative site guarding  Pain Intervention(s) Limited activity within patient's tolerance;Monitored during session;Repositioned  Cognition  Arousal/Alertness Awake/alert  Behavior During Therapy WFL for tasks assessed/performed  Overall Cognitive Status Impaired/Different from baseline  Area of Impairment Awareness;Problem solving;Memory;Safety/judgement  Memory Decreased short-term memory   Safety/Judgement Decreased awareness of safety;Decreased awareness of deficits  Awareness Emergent  Problem Solving Slow processing;Difficulty sequencing;Requires verbal cues;Decreased initiation  Upper Extremity Assessment  Upper Extremity Assessment Defer to OT evaluation  Lower Extremity Assessment  Lower Extremity Assessment RLE deficits/detail  RLE Deficits / Details Deficits consistent with post op pain and weakness. Very limited ROM  Cervical / Trunk Assessment  Cervical / Trunk Assessment Kyphotic;Other exceptions  Cervical / Trunk Exceptions R lateral lean  Bed Mobility  Overal bed mobility Needs Assistance  Bed Mobility Supine to Sit;Sit to Supine  Supine to sit Max assist;+2 for physical assistance;+2 for safety/equipment  Sit to supine Max assist;+2 for physical assistance;+2 for safety/equipment  General bed mobility comments using bed pad and max assist +2 to pivot towards EOB, scoot foward. R lateral lean in sitting and requiring min up to mod A.  Transfers  Overall transfer level Needs assistance  Equipment used Rolling walker (2 wheels)  Transfers Sit to/from Stand  Sit to Stand Max assist;+2 physical assistance;+2 safety/equipment  General transfer comment max +2 to power up and steady pt pulling on RW, posterior lean  Balance  Overall balance assessment Needs assistance  Sitting-balance support Bilateral upper extremity supported;Feet supported  Sitting balance-Leahy Scale Poor  Sitting balance - Comments R lateral lean, able to correct with cueing but unable to maintain  Postural control Right lateral lean;Posterior lean  Standing balance support Bilateral upper extremity supported;During functional activity;Reliant on assistive device for balance  Standing balance-Leahy Scale Zero  Standing balance comment +2 max assist  General Comments  General comments (skin integrity, edema, etc.) spouse present and supportive  PT - End of Session  Equipment Utilized  During Treatment Gait belt  Activity Tolerance Patient limited by pain;Treatment limited secondary to medical complications (Comment) (nausea)  Patient left in bed;with call bell/phone within reach;with  bed alarm set;with family/visitor present  Nurse Communication Mobility status  PT Assessment  PT Recommendation/Assessment Patient needs continued PT services  PT Visit Diagnosis Unsteadiness on feet (R26.81);Muscle weakness (generalized) (M62.81);History of falling (Z91.81);Difficulty in walking, not elsewhere classified (R26.2);Pain  Pain - Right/Left Right  Pain - part of body Leg  PT Problem List Decreased strength;Decreased range of motion;Decreased activity tolerance;Decreased balance;Decreased mobility;Decreased knowledge of use of DME;Decreased knowledge of precautions;Pain  PT Plan  PT Frequency (ACUTE ONLY) Min 3X/week  PT Treatment/Interventions (ACUTE ONLY) DME instruction;Gait training;Therapeutic activities;Functional mobility training;Therapeutic exercise;Balance training;Patient/family education  AM-PAC PT "6 Clicks" Mobility Outcome Measure (Version 2)  Help needed turning from your back to your side while in a flat bed without using bedrails? 1  Help needed moving from lying on your back to sitting on the side of a flat bed without using bedrails? 1  Help needed moving to and from a bed to a chair (including a wheelchair)? 1  Help needed standing up from a chair using your arms (e.g., wheelchair or bedside chair)? 1  Help needed to walk in hospital room? 1  Help needed climbing 3-5 steps with a railing?  1  6 Click Score 6  Consider Recommendation of Discharge To: CIR/SNF/LTACH  Progressive Mobility  What is the highest level of mobility based on the progressive mobility assessment? Level 1 (Bedfast) - Unable to balance while sitting on edge of bed  Mobility Sit up in bed/chair position for meals  PT Recommendation  Follow Up Recommendations Skilled nursing-short term  rehab (<3 hours/day)  Assistance recommended at discharge Frequent or constant Supervision/Assistance  Functional Status Assessment Patient has had a recent decline in their functional status and demonstrates the ability to make significant improvements in function in a reasonable and predictable amount of time.  PT equipment Wheelchair (measurements PT);Wheelchair cushion (measurements PT)  Individuals Consulted  Consulted and Agree with Results and Recommendations Patient;Family member/caregiver  Family Member Consulted husband  Acute Rehab PT Goals  Patient Stated Goal to go to her granddaughter's wedding  PT Goal Formulation With patient  Time For Goal Achievement 05/22/21  Potential to Achieve Goals Good  PT Time Calculation  PT Start Time (ACUTE ONLY) 1205  PT Stop Time (ACUTE ONLY) 1229  PT Time Calculation (min) (ACUTE ONLY) 24 min  PT General Charges  $$ ACUTE PT VISIT 1 Visit  PT Evaluation  $PT Eval Moderate Complexity 1 Mod  Written Expression  Dominant Hand Right   Pt admitted secondary to problem above with deficits above. Pt with increased pain and nausea which limited mobility. Required max A +2 for bed mobility and to stand at EOB using RW. Pt with R lateral and posterior lean during mobility tasks. Recommending SNF level therapies at d/c to increase independence and safety. Will continue to follow acutely.   Reuel Derby, PT, DPT  Acute Rehabilitation Services  Pager: [phone] Office: [phone]

## 2021-05-08 NOTE — TOC CAGE-AID Note (Signed)
Transition of Care [facility]) - CAGE-AID Screening   Patient Details  Name: Hannah [name] MRN: [id number] Date of Birth: 25-Jun-1945  Transition of [facility] CM/SW Contact:    Gaetano Hawthorne Tarpley-Carter, [place] Phone Number: 05/08/2021, 9:21 AM   Clinical Narrative: Pt participated in [place].  Pt stated she does not use substance or ETOH.  Pt was not offered resources, due to no usage of substance or ETOH.     Kambre Messner Tarpley-Carter, MSW, LCSW-A Pronouns:  She/Her/Hers [facility] HealthTransitions of Care Clinical Social Worker Direct Number:  [phone] [email or web address]   CAGE-AID Screening:    Have You Ever Felt You Ought to Cut Down on Your Drinking or Drug Use?: No Have People Annoyed You By [employer] Your Drinking Or Drug Use?: No Have You Felt Bad Or Guilty About Your Drinking Or Drug Use?: No Have You Ever Had a Drink or Used Drugs First Thing In The Morning to Steady Your Nerves or to Get Rid of a Hangover?: No CAGE-AID Score: 0  Substance Abuse Education Offered: No

## 2021-05-08 NOTE — Progress Notes (Signed)
PROGRESS NOTE    Hannah [name]  QMV:[id number] DOB: [birth year] DOA: 05/06/2021 PCP: [name], DO    Brief Narrative:  75 year old with history of chronic diastolic heart failure and persistent A. fib on Eliquis, type 2 diabetes on insulin, moderate persistent asthma, stage IV chronic kidney disease with baseline creatinine about 2.2-2.5 presented with fall at home, right hip pain and found to have right intertrochanteric hip fracture.   Assessment & Plan:   Principal Problem:   Closed right hip fracture (HCC) Active Problems:   Allergic rhinitis   Asthma   Hyperlipidemia   Type 2 diabetes mellitus (HCC)   CKD (chronic kidney disease), stage IV (HCC)   Chronic diastolic CHF (congestive heart failure) (HCC)   GERD (gastroesophageal reflux disease)   PAF (paroxysmal atrial fibrillation) (HCC)   Right hip pain  Close traumatic right intertrochanteric hip fracture Status post ORIF, IM nailing Dr. Sammuel Hines 11/27.   Adequate pain medications with IV and oral opiates. Weightbearing as tolerated. PT/OT DVT prophylaxis, will resume Eliquis today as per surgery plan. Anticipate discharge to skilled nursing facility after she works with therapies.  Diabetes type 2 uncontrolled with hyperglycemia: On insulin regimen at home.  Blood sugars are better today on home medications.  Acute kidney injury with CKD stage IV/hyperkalemia: Kidney function slightly worse today probably due to intraoperative hemodynamic disturbances.  Evidence of hemoconcentration. Low-dose maintenance fluid overnight.  1 unit of PRBC transfusion.  Recheck tomorrow morning.  Anemia of acute blood loss in the patient with anemia of chronic disease: Baseline hemoglobin 9-10.  Expected from blood loss from long bone fracture and perioperative blood loss. Hemoglobin 7.  WBC and renal functions with evidence of hemoconcentration.  She will benefit with 1 unit of PRBC transfusion.  Consented and transfusion ordered.   Recheck tomorrow morning.  Chronic diastolic heart failure: Well compensated and euvolemic.  Gentle IV fluids overnight.  Paroxysmal A. fib: Rate controlled on metoprolol.  Continued metoprolol and amiodarone.  She is sinus rhythm.  Therapeutic on Eliquis.    DVT prophylaxis: apixaban (ELIQUIS) tablet 2.5 mg Start: 05/08/21 1000 SCDs Start: 05/06/21 2010 apixaban (ELIQUIS) tablet 2.5 mg   Code Status: Full code Family Communication: Husband and son at the bedside. Disposition Plan: Status is: Inpatient  Remains inpatient appropriate because: Needing inpatient surgery.         Consultants:  Orthopedics  Procedures:  Right hip IM nail  Antimicrobials:  None   Subjective:  Patient seen and examined.  Pain is controlled but not yet mobilized.  Denies any other complaints.  Multiple questions by family members about mobility and facility placement.  Objective: Vitals:   05/08/21 0335 05/08/21 0753 05/08/21 0814 05/08/21 0900  BP: (!) 122/43 (!) 119/34  (!) 125/34  Pulse: 80 84  87  Resp: 18 (!) 21  20  Temp: 98.2 F (36.8 C) 98.4 F (36.9 C)    TempSrc: Oral Oral    SpO2: 99% 97% 94% 98%  Weight:      Height:       No intake or output data in the 24 hours ending 05/08/21 1231  Filed Weights   05/06/21 1832  Weight: 63.5 kg    Examination:  General exam: Appears calm and comfortable at rest. Respiratory system: Clear to auscultation. Respiratory effort normal. Cardiovascular system: S1 & S2 heard, RRR. No JVD, murmurs, rubs, gallops or clicks. No pedal edema. Gastrointestinal system: Abdomen is nondistended, soft and nontender. No organomegaly or masses felt. Normal bowel  sounds heard. Central nervous system: Alert and oriented. No focal neurological deficits. Extremities: Symmetric 5 x 5 power. Right lateral thigh incision clean and dry.  Mild tenderness incision site.  No drainage.  Distal neurovascular status intact.    Data Reviewed: I have  personally reviewed following labs and imaging studies  CBC: Recent Labs  Lab 05/06/21 1824 05/07/21 0529 05/08/21 0635  WBC 6.2 9.3 13.5*  NEUTROABS 4.6  --  11.5*  HGB 10.5* 8.5* 7.0*  HCT 35.2* 30.0* 23.8*  MCV 96.4 98.7 98.3  PLT 129* 128* 101*   Basic Metabolic Panel: Recent Labs  Lab 05/06/21 1824 05/06/21 2018 05/07/21 0529 05/08/21 0635  NA 135  --  134* 135  K 4.2  --  5.0 5.3*  CL 102  --  103 103  CO2 24  --  24 23  GLUCOSE 166*  --  155* 202*  BUN 26*  --  27* 43*  CREATININE 2.25*  --  2.33* 3.49*  CALCIUM 9.3  --  8.7* 8.5*  MG  --  2.0 1.9 2.0   GFR: Estimated Creatinine Clearance: 12 mL/min (A) (by C-G formula based on SCr of 3.49 mg/dL (H)). Liver Function Tests: Recent Labs  Lab 05/07/21 0529  AST 67*  ALT 45*  ALKPHOS 58  BILITOT 0.8  PROT 5.3*  ALBUMIN 2.5*   No results for input(s): LIPASE, AMYLASE in the last 168 hours. No results for input(s): AMMONIA in the last 168 hours. Coagulation Profile: Recent Labs  Lab 05/06/21 1824  INR 1.3*   Cardiac Enzymes: No results for input(s): CKTOTAL, CKMB, CKMBINDEX, TROPONINI in the last 168 hours. BNP (last 3 results) No results for input(s): PROBNP in the last 8760 hours. HbA1C: No results for input(s): HGBA1C in the last 72 hours. CBG: Recent Labs  Lab 05/07/21 1815 05/07/21 2332 05/08/21 0602 05/08/21 0830 05/08/21 1141  GLUCAP 201* 166* 169* 185* 184*   Lipid Profile: No results for input(s): CHOL, HDL, LDLCALC, TRIG, CHOLHDL, LDLDIRECT in the last 72 hours. Thyroid Function Tests: No results for input(s): TSH, T4TOTAL, FREET4, T3FREE, THYROIDAB in the last 72 hours. Anemia Panel: No results for input(s): VITAMINB12, FOLATE, FERRITIN, TIBC, IRON, RETICCTPCT in the last 72 hours. Sepsis Labs: No results for input(s): PROCALCITON, LATICACIDVEN in the last 168 hours.  Recent Results (from the past 240 hour(s))  Resp Panel by RT-PCR (Flu A&B, Covid) Nasopharyngeal Swab      Status: None   Collection Time: 05/06/21  8:18 PM   Specimen: Nasopharyngeal Swab; Nasopharyngeal(NP) swabs in vial transport medium  Result Value Ref Range Status   SARS Coronavirus 2 by RT PCR NEGATIVE NEGATIVE Final    Comment: (NOTE) SARS-CoV-2 target nucleic acids are NOT DETECTED.  The SARS-CoV-2 RNA is generally detectable in upper respiratory specimens during the acute phase of infection. The lowest concentration of SARS-CoV-2 viral copies this assay can detect is 138 copies/mL. A negative result does not preclude SARS-Cov-2 infection and should not be used as the sole basis for treatment or other patient management decisions. A negative result may occur with  improper specimen collection/handling, submission of specimen other than nasopharyngeal swab, presence of viral mutation(s) within the areas targeted by this assay, and inadequate number of viral copies(<138 copies/mL). A negative result must be combined with clinical observations, patient history, and epidemiological information. The expected result is Negative.  Fact Sheet for Patients:  EntrepreneurPulse.com.au  Fact Sheet for Healthcare Providers:  IncredibleEmployment.be  This test is no t yet  approved or cleared by the [place] and  has been authorized for detection and/or diagnosis of SARS-CoV-2 by FDA under an Emergency Use Authorization (EUA). This EUA will remain  in effect (meaning this test can be used) for the duration of the COVID-19 declaration under Section 564(b)(1) of the Act, 21 U.S.C.section 360bbb-3(b)(1), unless the authorization is terminated  or revoked sooner.       Influenza A by PCR NEGATIVE NEGATIVE Final   Influenza B by PCR NEGATIVE NEGATIVE Final    Comment: (NOTE) The Xpert Xpress SARS-CoV-2/FLU/RSV plus assay is intended as an aid in the diagnosis of influenza from Nasopharyngeal swab specimens and should not be used as a sole basis  for treatment. Nasal washings and aspirates are unacceptable for Xpert Xpress SARS-CoV-2/FLU/RSV testing.  Fact Sheet for Patients: EntrepreneurPulse.com.au  Fact Sheet for Healthcare Providers: IncredibleEmployment.be  This test is not yet approved or cleared by the [place] FDA and has been authorized for detection and/or diagnosis of SARS-CoV-2 by FDA under an Emergency Use Authorization (EUA). This EUA will remain in effect (meaning this test can be used) for the duration of the COVID-19 declaration under Section 564(b)(1) of the Act, 21 U.S.C. section 360bbb-3(b)(1), unless the authorization is terminated or revoked.  Performed at [facility], [place] [address], [place], [place] [address]   Surgical pcr screen     Status: None   Collection Time: 05/07/21  9:28 AM   Specimen: Nasal Mucosa; Nasal Swab  Result Value Ref Range Status   MRSA, PCR NEGATIVE NEGATIVE Final   Staphylococcus aureus NEGATIVE NEGATIVE Final    Comment: (NOTE) The Xpert SA Assay (FDA approved for NASAL specimens in patients 61 years of age and older), is one component of a comprehensive surveillance program. It is not intended to diagnose infection nor to guide or monitor treatment. Performed at [facility], [place] [address], [place], [place] [address]          Radiology Studies: DG Chest 1 View  Result Date: 05/06/2021 CLINICAL DATA:  Initial evaluation for acute trauma, fall. EXAM: CHEST  1 VIEW COMPARISON:  Prior radiograph from 08/10/2020. FINDINGS: Left-sided pacemaker/AICD in place. Median sternotomy wires underlying surgical clips and CABG markers noted. Valvular replacement noted as well. Transverse heart size at the upper limits of normal, stable. Mediastinal silhouette within normal limits. Aortic atherosclerosis. Lungs normally inflated. Mild linear subsegmental atelectasis present at the right lung base. Blunting of the right  costophrenic angle suggestive of a trace right pleural effusion. No pulmonary edema. No focal infiltrates. No pneumothorax. Osteopenia. No definite acute osseous abnormality. Advanced arthritic changes noted about the shoulders. IMPRESSION: 1. Trace right pleural effusion with associated mild right basilar subsegmental atelectasis. 2. No other active cardiopulmonary disease. 3.  Aortic Atherosclerosis (ICD10-I70.0). Electronically Signed   By: Jeannine Boga M.D.   On: 05/06/2021 19:27   DG Knee 1-2 Views Right  Result Date: 05/06/2021 CLINICAL DATA:  Initial evaluation for acute trauma, fall. EXAM: RIGHT KNEE - 1-2 VIEW COMPARISON:  None. FINDINGS: No acute fracture or dislocation. No joint effusion. Mild osteoarthritic changes about the knee. Advanced osteopenia. No acute soft tissue abnormality. Prominent vascular calcifications noted. Surgical clips from presumed previous vessel harvesting noted at the right lower extremity. IMPRESSION: 1. No acute osseous abnormality about the right knee. 2. Advanced osteopenia. Electronically Signed   By: Jeannine Boga M.D.   On: 05/06/2021 19:29   DG C-Arm 1-60 Min-No Report  Result Date: 05/07/2021  Fluoroscopy was utilized by the requesting physician.  No radiographic interpretation.   DG C-Arm 1-60 Min-No Report  Result Date: 05/07/2021 Fluoroscopy was utilized by the requesting physician.  No radiographic interpretation.   DG Hip Unilat With Pelvis 2-3 Views Right  Result Date: 05/06/2021 CLINICAL DATA:  Initial evaluation for acute pain status post fall. EXAM: DG HIP (WITH OR WITHOUT PELVIS) 2-3V RIGHT COMPARISON:  None. FINDINGS: Acute comminuted intratrochanteric fracture of the right hip with superior subluxation. Femoral head remains normally position within the acetabulum. Femoral head height maintained. Remainder of the bony pelvis grossly intact. Underlying severe osteopenia. Moderate spondylosis noted within the lower lumbar spine.  No visible soft tissue injury. Prominent vascular calcifications noted about the pelvis and proximal thighs. IMPRESSION: Acute comminuted intertrochanteric fracture of the right hip. Electronically Signed   By: Jeannine Boga M.D.   On: 05/06/2021 19:25   DG FEMUR, MIN 2 VIEWS RIGHT  Result Date: 05/07/2021 CLINICAL DATA:  Intertrochanteric fracture, surgery EXAM: RIGHT FEMUR 2 VIEWS COMPARISON:  05/06/2021 FLUOROSCOPY TIME:  1 minute 52 seconds Dose: 15.91 mGy Images: 4 FINDINGS: Marked osseous demineralization. Images demonstrate placement of an IM nail with proximal compression screw and distal locking screw across a reduced intertrochanteric fracture of the RIGHT femur. No dislocation. Degenerative changes RIGHT knee joint. Extensive atherosclerotic calcifications. IMPRESSION: Post nailing of intertrochanteric fracture RIGHT femur. Marked osseous demineralization. Electronically Signed   By: Lavonia Dana M.D.   On: 05/07/2021 14:33        Scheduled Meds:  sodium chloride   Intravenous Once   amiodarone  200 mg Oral Daily   apixaban  2.5 mg Oral BID   DULoxetine  20 mg Oral Daily   ezetimibe  10 mg Oral Daily   famotidine  20 mg Oral Daily   insulin aspart  0-9 Units Subcutaneous Q6H   insulin aspart  10 Units Subcutaneous TID WC   insulin glargine-yfgn  24 Units Subcutaneous QHS   metoprolol tartrate  12.5 mg Oral Daily   mometasone-formoterol  2 puff Inhalation BID   rosuvastatin  40 mg Oral QHS   Continuous Infusions:  sodium chloride 75 mL/hr at 05/08/21 1203   promethazine (PHENERGAN) injection (IM or IVPB) 12.5 mg (05/07/21 1555)     LOS: 2 days    Time spent: 30 minutes    Barb Merino, MD [facility] Pager [phone]

## 2021-05-09 LAB — CBC WITH DIFFERENTIAL/PLATELET
Abs Immature Granulocytes: 0.1 10*3/uL — ABNORMAL HIGH (ref 0.00–0.07)
Basophils Absolute: 0 10*3/uL (ref 0.0–0.1)
Basophils Relative: 0 %
Eosinophils Absolute: 0 10*3/uL (ref 0.0–0.5)
Eosinophils Relative: 0 %
HCT: 27.2 % — ABNORMAL LOW (ref 36.0–46.0)
Hemoglobin: 8.3 g/dL — ABNORMAL LOW (ref 12.0–15.0)
Immature Granulocytes: 1 %
Lymphocytes Relative: 7 %
Lymphs Abs: 0.8 10*3/uL (ref 0.7–4.0)
MCH: 28.5 pg (ref 26.0–34.0)
MCHC: 30.5 g/dL (ref 30.0–36.0)
MCV: 93.5 fL (ref 80.0–100.0)
Monocytes Absolute: 1.5 10*3/uL — ABNORMAL HIGH (ref 0.1–1.0)
Monocytes Relative: 12 %
Neutro Abs: 10 10*3/uL — ABNORMAL HIGH (ref 1.7–7.7)
Neutrophils Relative %: 80 %
Platelets: 93 10*3/uL — ABNORMAL LOW (ref 150–400)
RBC: 2.91 MIL/uL — ABNORMAL LOW (ref 3.87–5.11)
RDW: 17.7 % — ABNORMAL HIGH (ref 11.5–15.5)
WBC: 12.4 10*3/uL — ABNORMAL HIGH (ref 4.0–10.5)
nRBC: 0 % (ref 0.0–0.2)

## 2021-05-09 LAB — TYPE AND SCREEN
ABO/RH(D): O POS
Antibody Screen: NEGATIVE
Unit division: 0

## 2021-05-09 LAB — BASIC METABOLIC PANEL
Anion gap: 9 (ref 5–15)
BUN: 57 mg/dL — ABNORMAL HIGH (ref 8–23)
CO2: 22 mmol/L (ref 22–32)
Calcium: 8.4 mg/dL — ABNORMAL LOW (ref 8.9–10.3)
Chloride: 105 mmol/L (ref 98–111)
Creatinine, Ser: 4.24 mg/dL — ABNORMAL HIGH (ref 0.44–1.00)
GFR, Estimated: 10 mL/min — ABNORMAL LOW (ref >60)
Glucose, Bld: 132 mg/dL — ABNORMAL HIGH (ref 70–99)
Potassium: 4.8 mmol/L (ref 3.5–5.1)
Sodium: 136 mmol/L (ref 135–145)

## 2021-05-09 LAB — GLUCOSE, CAPILLARY
Glucose-Capillary: 113 mg/dL — ABNORMAL HIGH (ref 70–99)
Glucose-Capillary: 121 mg/dL — ABNORMAL HIGH (ref 70–99)
Glucose-Capillary: 126 mg/dL — ABNORMAL HIGH (ref 70–99)
Glucose-Capillary: 152 mg/dL — ABNORMAL HIGH (ref 70–99)
Glucose-Capillary: 173 mg/dL — ABNORMAL HIGH (ref 70–99)

## 2021-05-09 LAB — BPAM RBC
Blood Product Expiration Date: 202212192359
ISSUE DATE / TIME: 202211281555
Unit Type and Rh: 5100

## 2021-05-09 MED ORDER — METHOCARBAMOL 500 MG PO TABS
ORAL_TABLET | ORAL | Status: AC
  Filled 2021-05-09: qty 1

## 2021-05-09 MED ORDER — ALBUMIN HUMAN 25 % IV SOLN
INTRAVENOUS | Status: AC
  Filled 2021-05-09: qty 100

## 2021-05-09 MED ORDER — OXYCODONE HCL 5 MG PO TABS
5.0000 mg | ORAL_TABLET | Freq: Four times a day (QID) | ORAL | Status: DC | PRN
  Administered 2021-05-12: 5 mg via ORAL
  Filled 2021-05-09: qty 1

## 2021-05-09 MED ORDER — HYDROMORPHONE HCL 1 MG/ML IJ SOLN: 0.5000 mg | INTRAMUSCULAR | Status: DC | PRN

## 2021-05-09 MED ORDER — ALBUMIN HUMAN 25 % IV SOLN
25.0000 g | Freq: Four times a day (QID) | INTRAVENOUS | Status: AC
  Administered 2021-05-09 (×2): 25 g via INTRAVENOUS

## 2021-05-09 NOTE — Consult Note (Addendum)
[facility] Nephrology Consultation Note  Requesting MD: Dr.Ghimire, Dante Gang Reason for consult: AKI on CKD4  HPI:  Hannah [name] is a 75 y.o. female with history of type 2 diabetes, hypertension, CAD status post CABG, A. fib on Eliquis, grade 2 diastolic dysfunction, CKD stage IV with baseline serum creatinine level seems to be between 2.2-2.8 who was presented after fall at home with right hip intertrochanteric fracture, seen as a consultation for the evaluation of AKI on CKD.  It seems like patient has longstanding CKD 4 with she follows with Dr. Posey Pronto at [place] [facility].  Reportedly,she was seen in the office few weeks ago when she was told to have elevated creatinine level.  The creatinine level was 2.25 on admission.  She underwent ORIF with intramedullary nailing on 11/27.  She also developed worsening anemia due to blood loss and surgery.  She received a unit of blood transfusion.  The creatinine level continue to worsen to 4.24 today.  She was receiving gentle IV hydration. On admission her initial blood pressure was around 190s however in the evening of admission on 11/27 the blood pressure dropped to 90s.  The blood pressure improved with IV hydration.  Metoprolol and diuretics is on hold. Today patient said she is trying to hold urine because she does not want to urinate on herself.  The indwelling Foley catheter was discontinued.  She and her husband requesting to place catheter so that she can urinate comfortably.  She denies nausea, vomiting, dysgeusia, chest pain, shortness of breath, headache or dizziness.  The pain is currently managing well. No other renal insults including NSAIDs, IV contrast noted.  Not on ACE inhibitor or ARB. Echo done in 09/2020 with EF of 60 to 65% and grade 2 diastolic dysfunction. The patient's husband was present at the bedside.   Creatinine, Ser  Date/Time Value Ref Range Status  05/09/2021 01:42 AM 4.24 (H) 0.44 - 1.00 mg/dL Final   05/08/2021 06:35 AM 3.49 (H) 0.44 - 1.00 mg/dL Final  05/07/2021 05:29 AM 2.33 (H) 0.44 - 1.00 mg/dL Final  05/06/2021 06:24 PM 2.25 (H) 0.44 - 1.00 mg/dL Final  04/20/2021 11:56 AM 2.29 (H) 0.44 - 1.00 mg/dL Final  04/04/2021 01:20 PM 2.40 (H) 0.44 - 1.00 mg/dL Final  03/23/2021 09:59 AM 2.54 (H) 0.44 - 1.00 mg/dL Final  01/25/2021 11:38 AM 3.09 (H) 0.40 - 1.20 mg/dL Final  11/11/2020 11:49 AM 2.30 (H) 0.44 - 1.00 mg/dL Final  10/21/2020 11:16 AM 2.42 (H) 0.40 - 1.20 mg/dL Final  08/02/2020 11:30 AM 2.89 (H) 0.44 - 1.00 mg/dL Final  07/18/2020 12:15 PM 2.44 (H) 0.57 - 1.00 mg/dL Final   PMHx:   Past Medical History:  Diagnosis Date   Abnormally small mouth    Allergic rhinitis 10/14/2009   Qualifier: Diagnosis of  By: Lamonte Sakai MD, Rose Fillers   Overview:  Overview:  Qualifier: Diagnosis of  By: Lamonte Sakai MD, Rose Fillers  Last Assessment & Plan:  Please continue Xyzal and Nasacort as you have been using them   Anemia    Asthma 05/12/2009   10/12/2014 p extensive coaching HFA effectiveness =    75% s spacer    Overview:  Overview:  10/12/2014 p extensive coaching HFA effectiveness =    75% s spacer   Last Assessment & Plan:  Please continue Symbicort 2 puffs twice a day. Remember to rinse and gargle after taking this medication. Take albuterol 2 puffs up to every 4 hours if needed for  shortness of breath.  Follow with Dr Lamonte Sakai in 6 month   Bilateral carotid artery stenosis    a. mild 1-39% by duplex 10/2017, due 2021.   Breast cancer ([place]) 01/18/2016   right breast   CAD (coronary artery disease) 01/24/2011   a. s/p CABG 1998 with redo 2009.   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   Chemotherapy-induced thrombocytopenia 04/06/2016   Chronic diastolic CHF (congestive heart failure) ([place]) 09/24/2013   CKD (chronic kidney disease), stage IV ([place]) 09/24/2013   Closed fracture of head of left humerus 09/60/4540   Complication of  anesthesia    Dysrhythmia    a-fib   Gallstones    GERD (gastroesophageal reflux disease)    Glaucoma    H/O atrial tachycardia 05/12/2009   Qualifier: History of  By: Lamonte Sakai MD, Rose Fillers   Overview:  Overview:  Qualifier: History of  By: Lamonte Sakai MD, Rose Fillers  Last Assessment & Plan:  There was no evidence of this on her cardiac monitor. I would recommend watchful waiting.    Heart murmur    History of COVID-19 07/2019   ASYMPTOMATIC   History of kidney stones    History of non-ST elevation myocardial infarction (NSTEMI)    Sept 2014--  thought to be type II HTN w/ LHC without infarct related artery and patent grafts   History of radiation therapy 05/24/16-07/26/16   right breast 50.4 Gy in 28 fractions, right breast boost 10 Gy in 5 fractions   Hyperlipidemia    Hypotension    Iron deficiency anemia    Mania ([place]) 03/05/2016   Moderate persistent asthma    pulmologist-  Dr. Malvin Johns   Osteomyelitis of toe of left foot ([place]) 04/06/2016   Osteopenia of multiple sites 10/19/2015   PAF (paroxysmal atrial fibrillation) ([place])    Personal history of chemotherapy 2017   Personal history of radiation therapy 2017   Pleural effusion on right 05/2019   RESOLVED   PONV (postoperative nausea and vomiting)    Psoriasis    right leg   Renal calculus, right    S/P AVR    prosthesis valve placement 2009 at same time re-do CABG   Seizure-like activity ([place]) 06/29/2016   EEG NORMAL, HEAD CT WITHOUT ACUTE FINDINGS   Sensorineural hearing loss (SNHL) of both ears 01/06/2016   Stroke ([place]) 2013   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   Syncope 09/06/2017   Type 2 diabetes mellitus ([place])    monitored by dr Dwyane Dee   Uses roller walker     Past Surgical History:  Procedure Laterality Date   AORTIC VALVE REPLACEMENT  2009   [facility] (done same time is CABG)   BREAST BIOPSY Right 02/12/2019   2 lymphnodes, 1 breast   BREAST LUMPECTOMY Right 01/18/2016    BREAST LUMPECTOMY WITH RADIOACTIVE SEED AND SENTINEL LYMPH NODE BIOPSY Right 01/18/2016   Procedure: RIGHT BREAST LUMPECTOMY WITH RADIOACTIVE SEED AND SENTINEL LYMPH NODE BIOPSY;  Surgeon: DAlphonsa Overall MD;  [facility]  Service: General;  Laterality: Right;   CARDIAC CATHETERIZATION  03/23/2008   Pre-redo CABG: L main OK, LAD (T), CFX (T), OM1 99%, RCA (T), LIMA-LAD OK, SVG-OM(?3) OK w/ little florw to OM2, SVG-RCA OK. EF NL   CARPAL TUNNEL RELEASE     CHEST TUBE INSERTION Right 06/26/2019   Procedure: INSERTION PLEURAL DRAINAGE CATHETER;  Surgeon: BGaye Pollack MD;  [facility];  Service: Thoracic;  Laterality: Right;   CHOLECYSTECTOMY N/A 07/07/2018   Procedure: LAPAROSCOPIC CHOLECYSTECTOMY WITH INTRAOPERATIVE CHOLANGIOGRAM ERAS PATHWAY;  Surgeon: Alphonsa Overall, MD;  Location[facility];  Service: General;  Laterality: N/A;   COLONOSCOPY     around 2015. Possibly with [place] &  re-do 2009   Had LIMA to DX/LAD, SVG to 2 marginal branches and SVG to [facility] originally; SVG to 3rd OM and PD at time of redo   CYSTOSCOPY W/ URETERAL STENT PLACEMENT Right 12/20/2014   Procedure: CYSTOSCOPY WITH RETROGRADE PYELOGRAM/URETERAL STENT PLACEMENT;  Surgeon: Cleon Gustin, MD;  Location: [facility];  Service: Urology;  Laterality: Right;   CYSTOSCOPY WITH RETROGRADE PYELOGRAM, URETEROSCOPY AND STENT PLACEMENT Left 08/21/2019   Procedure: CYSTOSCOPY WITH RETROGRADE PYELOGRAM, URETEROSCOPY AND STENT PLACEMENT;  Surgeon: Cleon Gustin, MD;  [facility];  Service: Urology;  Laterality: Left;  1 HR   CYSTOSCOPY/URETEROSCOPY/HOLMIUM LASER/STENT PLACEMENT Right 04/04/2021   Procedure: CYSTOSCOPY/URETEROSCOPY/HOLMIUM LASER/STONE EXTRACTION AND STENT PLACEMENT;  Surgeon: Remi Haggard, MD;  [facility];  Service: Urology;  Laterality: Right;  1 HR   ESOPHAGOGASTRODUODENOSCOPY     many years ago per patient     ESOPHAGOGASTRODUODENOSCOPY ENDOSCOPY  06/17/2018   EYE SURGERY Bilateral    cataracts   HOLMIUM LASER APPLICATION Right 37/62/8315   Procedure:  HOLMIUM LASER LITHOTRIPSY;  Surgeon: Cleon Gustin, MD;  Location: [facility];  Service: Urology;  Laterality: Right;   HOLMIUM LASER APPLICATION Left 17/61/6073   Procedure: HOLMIUM LASER APPLICATION;  Surgeon: Cleon Gustin, MD;  [facility];  Service: Urology;  Laterality: Left;   INTRAMEDULLARY (IM) NAIL INTERTROCHANTERIC Right 05/07/2021   Procedure: INTRAMEDULLARY (IM) NAIL INTERTROCHANTRIC;  Surgeon: Vanetta Mulders, MD;  Location: [facility];  Service: Orthopedics;  Laterality: Right;   IR KYPHO LUMBAR INC FX REDUCE BONE BX UNI/BIL CANNULATION INC/IMAGING  02/12/2020   IR THORACENTESIS ASP PLEURAL SPACE W/IMG GUIDE  10/03/2018   IR THORACENTESIS ASP PLEURAL SPACE W/IMG GUIDE  06/08/2019   LEFT HEART CATHETERIZATION WITH CORONARY/GRAFT ANGIOGRAM N/A 02/23/2013   Procedure: LEFT HEART CATHETERIZATION WITH Beatrix Fetters;  Surgeon: Blane Ohara, MD;  Location[facility];  Service: Cardiovascular;  Laterality: N/A;   LOOP RECORDER INSERTION N/A 08/30/2017   Procedure: LOOP RECORDER INSERTION;  Surgeon: Evans Lance, MD;  Location: [facility];  Service: Cardiovascular;  Laterality: N/A;   LOOP RECORDER REMOVAL N/A 08/10/2020   Procedure: LOOP RECORDER REMOVAL;  Surgeon: Evans Lance, MD;  Location: [place] [facility];  Service: Cardiovascular;  Laterality: N/A;   PACEMAKER IMPLANT N/A 08/10/2020   Procedure: PACEMAKER IMPLANT;  Surgeon: Evans Lance, MD;  Location: [facility];  Service: Cardiovascular;  Laterality: N/A;   PORTACATH PLACEMENT Left 01/18/2016   Procedure: INSERTION PORT-A-CATH;  Surgeon: Alphonsa Overall, MD;  Location: [facility];  Service: General;  Laterality: Left;   portacath removal  06/2016   REMOVAL OF PLEURAL DRAINAGE CATHETER Right 07/14/2019   Procedure: REMOVAL  OF PLEURAL DRAINAGE CATHETER;  Surgeon: Gaye Pollack, MD;  Location: [facility];  Service: Thoracic;  Laterality: Right;   TALC PLEURODESIS N/A 06/26/2019   Procedure: Pietro Cassis;  Surgeon: Gaye Pollack, MD;  [facility];  Service: Thoracic;  Laterality: N/A;   TONSILLECTOMY     TRANSTHORACIC ECHOCARDIOGRAM  02/24/2013   mild LVH,  ef 50-55%/  AV  bioprosthesis was present with very mild stenosis and no regurg., mean grandient 26mHg, peak grandient 275mg /  mild MR/  mild LAE and RAE/  moderate TR   TUBAL LIGATION  1983    Family Hx:  Family History  Problem Relation Age of Onset   Heart disease Father    Heart failure Father    Diabetes Maternal Grandmother    Heart disease Maternal Grandmother    Diabetes Son    Healthy Brother        #1   Heart attack Brother        #2   Heart disease Brother        #2   Colon cancer Neg Hx    Esophageal cancer Neg Hx     Social History:  reports that she has never smoked. She has never used smokeless tobacco. She reports that she does not drink alcohol and does not use drugs.  Allergies:  Allergies  Allergen Reactions   Amoxicillin Rash and Other (See Comments)    Tolerates Cephalosporins Has patient had a PCN reaction causing immediate rash, facial/tongue/throat swelling, SOB or lightheadedness with hypotension: Yes Has patient had a PCN reaction causing severe rash involving mucus membranes or skin necrosis: Yes Has patient had a PCN reaction that required hospitalization No Has patient had a PCN reaction occurring within the last 10 years: No If all of the above answers are "NO", then may proceed with Cephalosporin use.    Tape Other (See Comments)    Pulls off skin, must use paper tape   Aldactone [Spironolactone] Other (See Comments)    CKD/hypokalemia   Isosorbide Nitrate     Other reaction(s): fainting   K-Flex [Orphenadrine]     Other reaction(s): stomach pain   Arimidex [Anastrozole] Nausea Only   Latex  Itching and Other (See Comments)    (Dentist office)   Tetracycline Rash    Medications: Prior to Admission medications   Medication Sig Start Date End Date Taking? Authorizing Provider  acetaminophen (TYLENOL) 500 MG tablet Take 1,000 mg by mouth as needed for moderate pain.   Yes [provider]  albuterol (PROVENTIL) (2.5 MG/3ML) 0.083% nebulizer solution Take 3 mLs (2.5 mg total) by nebulization every 6 (six) hours as needed for wheezing or shortness of breath. 06/09/20  Yes Parrett, Tammy S, NP  albuterol (VENTOLIN HFA) 108 (90 Base) MCG/ACT inhaler Inhale 2 puffs into the lungs every 6 (six) hours as needed for wheezing or shortness of breath. 10/18/20  Yes ByCollene GobbleMD  amiodarone (PACERONE) 200 MG tablet TAKE ONE TABLET BY MOUTH DAILY Patient taking differently: Take 200 mg by mouth daily. 02/24/21  Yes TaEvans LanceMD  apixaban (ELIQUIS) 2.5 MG TABS tablet Take 1 tablet (2.5 mg total) by mouth 2 (two) times daily. 04/05/21  Yes NeRemi HaggardMD  cetirizine (ZYRTEC) 10 MG tablet Take 10 mg by mouth at bedtime.   Yes [provider]  Cholecalciferol (DIALYVITE VITAMIN D 5000) 125 MCG (5000 UT) capsule Take 5,000 Units by mouth daily.   Yes [provider]  DULoxetine (CYMBALTA) 20 MG capsule Take 1 capsule (20 mg total) by mouth daily. 03/17/21  Yes GuNicholas LoseMD  exemestane (AROMASIN) 25 MG tablet TAKE ONE TABLET BY MOUTH DAILY AFTER BREAKFAST Patient taking differently: 25 mg daily after breakfast. 09/06/20  Yes GuNicholas LoseMD  ezetimibe (ZETIA) 10 MG tablet Take 1 tablet (10 mg total) by mouth daily. 11/10/20  Yes TaLovena Le  Champ Mungo, MD  famotidine (PEPCID) 20 MG tablet TAKE ONE TABLET BY MOUTH TWICE DAILY. ONE AFTER SUPPER Patient taking differently: Take 20 mg by mouth 2 (two) times daily. 08/30/20  Yes Evans Lance, MD  furosemide (LASIX) 20 MG tablet Take 20 mg by mouth daily as needed for fluid.   Yes [provider]  insulin  glargine (LANTUS) 100 UNIT/ML injection Inject 24 Units into the skin daily.   Yes [provider]  insulin lispro (HUMALOG) 100 UNIT/ML injection Inject 14 Units into the skin 3 (three) times daily before meals.   Yes [provider]  metoprolol succinate (TOPROL-XL) 25 MG 24 hr tablet TAKE ONE-HALF TABLET BY MOUTH EVERY DAY Patient taking differently: 12.5 mg daily. 04/10/21  Yes Larey Dresser, MD  nitroGLYCERIN (NITROSTAT) 0.4 MG SL tablet Place 1 tablet (0.4 mg total) under the tongue every 5 (five) minutes as needed for chest pain. 04/04/20 03/23/22 Yes [name], NP  rosuvastatin (CRESTOR) 40 MG tablet Take 1 tablet (40 mg total) by mouth at bedtime. 03/22/21  Yes Larey Dresser, MD  SYMBICORT 160-4.5 MCG/ACT inhaler INHALE TWO PUFFS INTO THE LUNGS TWICE DAILY Patient taking differently: 2 puffs in the morning and at bedtime. 11/16/20  Yes Byrum, Rose Fillers, MD  TRULICITY 1.5 SW/1.0XN SOPN INJECT THE CONTENTS OF ONE  PEN SUBCUTANEOUSLY WEEKLY  AS DIRECTED Patient taking differently: Inject 1.5 mg into the skin once a week. 04/22/20  Yes Elayne Snare, MD  vitamin B-12 (CYANOCOBALAMIN) 100 MCG tablet Take 100 mcg by mouth daily.   Yes [provider]  zolpidem (AMBIEN) 5 MG tablet Take 7.5 mg by mouth at bedtime as needed for sleep.   Yes [provider]  Accu-Chek FastClix Lancets MISC Use to check blood sugar 3 times per day 06/16/20   Elayne Snare, MD  ACCU-CHEK GUIDE test strip Use to test blood sugar 3 times daily 09/07/20   Elayne Snare, MD  Blood Glucose Monitoring Suppl (ACCU-CHEK GUIDE ME) w/Device KIT Use accu chek meter to check blood sugar three times daily. 04/10/19   Elayne Snare, MD  Continuous Blood Gluc Receiver (FREESTYLE LIBRE 2 READER) DEVI Use to check blood sugars daily 12/09/20   Elayne Snare, MD  Insulin Lispro-aabc (LYUMJEV) 100 UNIT/ML SOLN Inject 10 units before breakfast, 18 before lunch and 14 before dinner.  Replaces Humalog Patient  not taking: Reported on 05/06/2021 04/28/21   Elayne Snare, MD  Insulin Pen Needle (BD PEN NEEDLE NANO U/F) 32G X 4 MM MISC USE AS INSTRUCTED TO INJECT INSULIN 4 TIMES DAILY. 09/16/19   Elayne Snare, MD  ondansetron (ZOFRAN ODT) 4 MG disintegrating tablet Take 1 tablet (4 mg total) by mouth every 8 (eight) hours as needed. Patient not taking: Reported on 05/06/2021 01/20/20   Isla Pence, MD    I have reviewed the patient's current medications.  Labs:  Results for orders placed or performed during the hospital encounter of 05/06/21 (from the past 48 hour(s))  Glucose, capillary     Status: Abnormal   Collection Time: 05/07/21 12:13 PM  Result Value Ref Range   Glucose-Capillary 142 (H) 70 - 99 mg/dL    Comment: Glucose reference range applies only to samples taken after fasting for at least 8 hours.  Glucose, capillary     Status: Abnormal   Collection Time: 05/07/21  3:34 PM  Result Value Ref Range   Glucose-Capillary 197 (H) 70 - 99 mg/dL    Comment: Glucose reference  range applies only to samples taken after fasting for at least 8 hours.  Glucose, capillary     Status: Abnormal   Collection Time: 05/07/21  6:15 PM  Result Value Ref Range   Glucose-Capillary 201 (H) 70 - 99 mg/dL    Comment: Glucose reference range applies only to samples taken after fasting for at least 8 hours.  Glucose, capillary     Status: Abnormal   Collection Time: 05/07/21 11:32 PM  Result Value Ref Range   Glucose-Capillary 166 (H) 70 - 99 mg/dL    Comment: Glucose reference range applies only to samples taken after fasting for at least 8 hours.  Glucose, capillary     Status: Abnormal   Collection Time: 05/08/21  6:02 AM  Result Value Ref Range   Glucose-Capillary 169 (H) 70 - 99 mg/dL    Comment: Glucose reference range applies only to samples taken after fasting for at least 8 hours.  CBC with Differential/Platelet     Status: Abnormal   Collection Time: 05/08/21  6:35 AM  Result Value Ref Range    WBC 13.5 (H) 4.0 - 10.5 K/uL   RBC 2.42 (L) 3.87 - 5.11 MIL/uL   Hemoglobin 7.0 (L) 12.0 - 15.0 g/dL    Comment: REPEATED TO VERIFY   HCT 23.8 (L) 36.0 - 46.0 %   MCV 98.3 80.0 - 100.0 fL   MCH 28.9 26.0 - 34.0 pg   MCHC 29.4 (L) 30.0 - 36.0 g/dL   RDW 15.9 (H) 11.5 - 15.5 %   Platelets 127 (L) 150 - 400 K/uL   nRBC 0.0 0.0 - 0.2 %   Neutrophils Relative % 86 %   Neutro Abs 11.5 (H) 1.7 - 7.7 K/uL   Lymphocytes Relative 4 %   Lymphs Abs 0.6 (L) 0.7 - 4.0 K/uL   Monocytes Relative 10 %   Monocytes Absolute 1.4 (H) 0.1 - 1.0 K/uL   Eosinophils Relative 0 %   Eosinophils Absolute 0.0 0.0 - 0.5 K/uL   Basophils Relative 0 %   Basophils Absolute 0.0 0.0 - 0.1 K/uL   Immature Granulocytes 0 %   Abs Immature Granulocytes 0.06 0.00 - 0.07 K/uL    Comment: Performed at [facility], [address], [place], [place] [address]  Basic metabolic panel     Status: Abnormal   Collection Time: 05/08/21  6:35 AM  Result Value Ref Range   Sodium 135 135 - 145 mmol/L   Potassium 5.3 (H) 3.5 - 5.1 mmol/L   Chloride 103 98 - 111 mmol/L   CO2 23 22 - 32 mmol/L   Glucose, Bld 202 (H) 70 - 99 mg/dL    Comment: Glucose reference range applies only to samples taken after fasting for at least 8 hours.   BUN 43 (H) 8 - 23 mg/dL   Creatinine, Ser 3.49 (H) 0.44 - 1.00 mg/dL   Calcium 8.5 (L) 8.9 - 10.3 mg/dL   GFR, Estimated 13 (L) >60 mL/min    Comment: (NOTE) Calculated using the CKD-EPI Creatinine Equation (2021)    Anion gap 9 5 - 15    Comment: Performed at [facility] [address] [place], [place] [address]  Magnesium     Status: None   Collection Time: 05/08/21  6:35 AM  Result Value Ref Range   Magnesium 2.0 1.7 - 2.4 mg/dL    Comment: Performed at [facility] [address] [place], [place] [address]  Glucose, capillary  Status: Abnormal   Collection Time: 05/08/21  8:30 AM  Result Value Ref Range   Glucose-Capillary 185 (H) 70 - 99 mg/dL    Comment: Glucose  reference range applies only to samples taken after fasting for at least 8 hours.  Prepare RBC (crossmatch)     Status: None   Collection Time: 05/08/21 11:33 AM  Result Value Ref Range   Order Confirmation      ORDER PROCESSED BY BLOOD BANK Performed at [facility], [place] [address], [place], [place] [address]   Glucose, capillary     Status: Abnormal   Collection Time: 05/08/21 11:41 AM  Result Value Ref Range   Glucose-Capillary 184 (H) 70 - 99 mg/dL    Comment: Glucose reference range applies only to samples taken after fasting for at least 8 hours.  Glucose, capillary     Status: None   Collection Time: 05/08/21  6:31 PM  Result Value Ref Range   Glucose-Capillary 90 70 - 99 mg/dL    Comment: Glucose reference range applies only to samples taken after fasting for at least 8 hours.  Glucose, capillary     Status: Abnormal   Collection Time: 05/09/21 12:11 AM  Result Value Ref Range   Glucose-Capillary 121 (H) 70 - 99 mg/dL    Comment: Glucose reference range applies only to samples taken after fasting for at least 8 hours.  CBC with Differential/Platelet     Status: Abnormal   Collection Time: 05/09/21  1:42 AM  Result Value Ref Range   WBC 12.4 (H) 4.0 - 10.5 K/uL   RBC 2.91 (L) 3.87 - 5.11 MIL/uL   Hemoglobin 8.3 (L) 12.0 - 15.0 g/dL   HCT 27.2 (L) 36.0 - 46.0 %   MCV 93.5 80.0 - 100.0 fL   MCH 28.5 26.0 - 34.0 pg   MCHC 30.5 30.0 - 36.0 g/dL   RDW 17.7 (H) 11.5 - 15.5 %   Platelets 93 (L) 150 - 400 K/uL    Comment: Immature Platelet Fraction may be clinically indicated, consider ordering this additional test [id number] REPEATED TO VERIFY PLATELET COUNT CONFIRMED BY SMEAR    nRBC 0.0 0.0 - 0.2 %   Neutrophils Relative % 80 %   Neutro Abs 10.0 (H) 1.7 - 7.7 K/uL   Lymphocytes Relative 7 %   Lymphs Abs 0.8 0.7 - 4.0 K/uL   Monocytes Relative 12 %   Monocytes Absolute 1.5 (H) 0.1 - 1.0 K/uL   Eosinophils Relative 0 %   Eosinophils Absolute 0.0 0.0 - 0.5 K/uL    Basophils Relative 0 %   Basophils Absolute 0.0 0.0 - 0.1 K/uL   WBC Morphology MORPHOLOGY UNREMARKABLE    RBC Morphology MORPHOLOGY UNREMARKABLE    Smear Review MORPHOLOGY UNREMARKABLE    Immature Granulocytes 1 %   Abs Immature Granulocytes 0.10 (H) 0.00 - 0.07 K/uL    Comment: Performed at [facility], [address], [place], [place] [address]  Basic metabolic panel     Status: Abnormal   Collection Time: 05/09/21  1:42 AM  Result Value Ref Range   Sodium 136 135 - 145 mmol/L   Potassium 4.8 3.5 - 5.1 mmol/L   Chloride 105 98 - 111 mmol/L   CO2 22 22 - 32 mmol/L   Glucose, Bld 132 (H) 70 - 99 mg/dL    Comment: Glucose reference range applies only to samples taken after fasting for at least 8 hours.   BUN 57 (H) 8 -  23 mg/dL   Creatinine, Ser 4.24 (H) 0.44 - 1.00 mg/dL   Calcium 8.4 (L) 8.9 - 10.3 mg/dL   GFR, Estimated 10 (L) >60 mL/min    Comment: (NOTE) Calculated using the CKD-EPI Creatinine Equation (2021)    Anion gap 9 5 - 15    Comment: Performed at [facility] [address], [place], [place] [address]  Glucose, capillary     Status: Abnormal   Collection Time: 05/09/21  9:11 AM  Result Value Ref Range   Glucose-Capillary 126 (H) 70 - 99 mg/dL    Comment: Glucose reference range applies only to samples taken after fasting for at least 8 hours.   *Note: Due to a large number of results and/or encounters for the requested time period, some results have not been displayed. A complete set of results can be found in Results Review.     ROS:  Pertinent items noted in HPI and remainder of comprehensive ROS otherwise negative.  Physical Exam: Vitals:   05/08/21 2000 05/09/21 0746  BP: (!) 120/45 (!) 132/29  Pulse:  77  Resp:  15  Temp: 98.2 F (36.8 C) 98.3 F (36.8 C)  SpO2:  99%     General exam: Appears calm and comfortable  Respiratory system: Clear to auscultation. Respiratory effort normal. No wheezing or crackle Cardiovascular system: S1  & S2 heard, RRR.  No pedal edema. Gastrointestinal system: Abdomen is nondistended, soft and nontender. Normal bowel sounds heard. Central nervous system: Alert and oriented. No focal neurological deficits. Extremities: No edema, no cyanosis. Skin: No rashes, lesions or ulcers Psychiatry: Judgement and insight appear normal. Mood & affect appropriate.   Assessment/Plan:  #Acute kidney injury on CKD stage IV likely hemodynamically mediated in the setting of hypotension and anemia after the surgery.  UA with many bacteria and chronic proteinuria.  Need to rule out obstruction therefore I will order bladder scan and external urinary catheter (discussed with RN). No other renal insult including IV contrast, NSAIDs, ACE inhibitor or ARB noted. I will increase IV fluid rate 100 cc an hour and add albumin to augment intravascular volume and renal perfusion.  Hopefully the kidney function will start improving in next 24 to 48 hours.  Continue strict ins and outs and daily lab.  No need for dialysis at this time.  #Hyperkalemia: Potassium level improved with medical management.  #Anemia due to acute blood loss from the surgery.  She may also have underlying anemia due to CKD.  Received a unit of blood transfusion.  Monitor CBC.  #Close right intertrochanteric hip fracture status post ORIF on 11/27.  Pain management, DVT prophylaxis, OT PT evaluation.  Thank you for the consult we will follow with you.  Discussed with the primary team.  Leith Hedlund Tanna Furry 05/09/2021, 11:18 AM  [facility].

## 2021-05-09 NOTE — NC FL2 (Signed)
[place] LEVEL OF CARE SCREENING TOOL     IDENTIFICATION  Patient Name: Hannah [name] Birthdate: 1945/12/17 Sex: female Admission Date (Current Location): 05/06/2021  [facility] and [place] Number:  Herbalist and Address:  The . [facility], [place] [address], [place], [place] [address]      Provider Number: [id number]  Attending Physician Name and Address:  Barb Merino, MD  Relative Name and Phone Number:  D[place], [place] [phone]  [phone]    Current Level of Care: Hospital Recommended Level of Care: [place] Prior Approval Number:    Date Approved/Denied:   PASRR Number: [id number]  Discharge Plan: SNF    Current Diagnoses: Patient Active Problem List   Diagnosis Date Noted   Closed right hip fracture ([place]) 05/06/2021   Right hip pain 05/06/2021   Pacemaker 11/15/2020   Primary osteoarthritis, left shoulder 10/27/2020   Tachycardia-bradycardia syndrome ([place]) 08/10/2020   Convulsion ([place]) 05/30/2020   Compression fracture of L1 vertebra with routine healing 02/25/2020   Compression fracture of L1 lumbar vertebra, closed, initial encounter ([place]) 01/27/2020   Pain in left shoulder 03/26/2019   History of breast cancer in female 03/26/2019   Hypokalemia 02/20/2019   Acute exacerbation of CHF (congestive heart failure) ([place]) 02/19/2019   Pleural effusion on right 09/25/2018   Thrombocytopenia ([place]) 09/25/2018   Cholecystitis with cholelithiasis 07/07/2018   Prolonged Q-T interval on ECG 05/21/2018   PAF (paroxysmal atrial fibrillation) ([place]) 01/23/2018   Closed fracture of head of left humerus 09/09/2017   Syncope 09/06/2017   Seizure-like activity ([place]) 09/01/2017   AKI (acute kidney injury) ([place]) 09/01/2017   Adrenal insufficiency ([place]) 07/03/2016   Seizure ([place]) 06/29/2016   Chemotherapy-induced peripheral neuropathy ([place]) 04/27/2016   Chemotherapy-induced thrombocytopenia 04/06/2016    Osteomyelitis of toe of left foot ([place]) 04/06/2016   Mania ([place]) 03/05/2016   GERD (gastroesophageal reflux disease) 02/29/2016   Chemotherapy induced nausea and vomiting 02/24/2016   Sensorineural hearing loss (SNHL) of both ears 01/06/2016   Breast cancer of upper-inner quadrant of right female breast ([place]) 12/16/2015   Osteopenia of multiple sites 10/19/2015   CKD (chronic kidney disease), stage IV (HCC) 09/24/2013   Chronic diastolic CHF (congestive heart failure) ([place]) 09/24/2013   Acute on chronic combined systolic and diastolic congestive heart failure ([place]) 07/17/2013   Atrial tachycardia ([place]) 04/03/2013   CAD (coronary artery disease) 01/24/2011   Chronic coronary artery disease 01/24/2011   History of aortic valve replacement 10/27/2010   Hyperlipidemia    DOE (dyspnea on exertion)    Nonischemic cardiomyopathy ([place])    Anemia of chronic disease    Type 2 diabetes mellitus (HCC)    Iron deficiency anemia    Allergic rhinitis 10/14/2009   Essential hypertension 05/12/2009   H/O atrial tachycardia 05/12/2009   Asthma 05/12/2009    Orientation RESPIRATION BLADDER Height & Weight     Self, Time, Situation, Place  Normal External catheter, Incontinent Weight: 140 lb (63.5 kg) Height:  5\' 4"  (162.6 cm)  BEHAVIORAL SYMPTOMS/MOOD NEUROLOGICAL BOWEL NUTRITION STATUS    Convulsions/Seizures Continent Diet (see discharge summary)  AMBULATORY STATUS COMMUNICATION OF NEEDS Skin   Total Care Verbally Surgical wounds                       Personal Care Assistance Level of Assistance  Bathing, Feeding, Dressing Bathing Assistance: Maximum assistance Feeding assistance: Limited assistance Dressing Assistance: Maximum assistance     Functional  Limitations Info  Sight, Hearing, Speech Sight Info: Adequate Hearing Info: Adequate Speech Info: Adequate    SPECIAL CARE FACTORS FREQUENCY  PT (By licensed PT), OT (By licensed OT)     PT Frequency: 5x week OT Frequency: 5x  week            Contractures Contractures Info: Not present    Additional Factors Info  Code Status, Allergies Code Status Info: full Allergies Info: Amoxicillin, Tape, Aldactone (Spironolactone), Isosorbide Nitrate, K-flex (Orphenadrine), Arimidex (Anastrozole), Latex, Tetracycline           Current Medications (05/09/2021):  This is the current hospital active medication list Current Facility-Administered Medications  Medication Dose Route Frequency Provider Last Rate Last Admin   0.9 %  sodium chloride infusion   Intravenous Continuous Rosita Fire, MD 100 mL/hr at 05/09/21 1508 Rate Change at 05/09/21 1508   acetaminophen (TYLENOL) tablet 650 mg  650 mg Oral Q6H PRN Howerter, Justin B, DO       Or   acetaminophen (TYLENOL) suppository 650 mg  650 mg Rectal Q6H PRN Howerter, Justin B, DO       albumin human 25 % solution 25 g  25 g Intravenous Q6H Rosita Fire, MD       albuterol (PROVENTIL) (2.5 MG/3ML) 0.083% nebulizer solution 2.5 mg  2.5 mg Nebulization Q6H PRN Howerter, Justin B, DO       amiodarone (PACERONE) tablet 200 mg  200 mg Oral Daily Barb Merino, MD   200 mg at 05/09/21 0901   apixaban (ELIQUIS) tablet 2.5 mg  2.5 mg Oral BID Barb Merino, MD   2.5 mg at 05/09/21 0859   DULoxetine (CYMBALTA) DR capsule 20 mg  20 mg Oral Daily Barb Merino, MD   20 mg at 05/09/21 0859   ezetimibe (ZETIA) tablet 10 mg  10 mg Oral Daily Barb Merino, MD   10 mg at 05/09/21 0900   famotidine (PEPCID) tablet 20 mg  20 mg Oral Daily Barb Merino, MD   20 mg at 05/09/21 0859   HYDROmorphone (DILAUDID) injection 0.5 mg  0.5 mg Intravenous Q4H PRN Barb Merino, MD       insulin aspart (novoLOG) injection 0-9 Units  0-9 Units Subcutaneous Q6H Howerter, Justin B, DO   2 Units at 05/09/21 1200   insulin aspart (novoLOG) injection 10 Units  10 Units Subcutaneous TID WC Barb Merino, MD   10 Units at 05/09/21 1200   insulin glargine-yfgn (SEMGLEE) injection 24  Units  24 Units Subcutaneous QHS Barb Merino, MD   24 Units at 05/08/21 2149   lip balm (CARMEX) ointment   Topical PRN Barb Merino, MD   75 application at 30/86/57 0859   melatonin tablet 3 mg  3 mg Oral QHS PRN Howerter, Justin B, DO       metoprolol tartrate (LOPRESSOR) tablet 12.5 mg  12.5 mg Oral Daily Barb Merino, MD   12.5 mg at 05/09/21 0900   mometasone-formoterol (DULERA) 200-5 MCG/ACT inhaler 2 puff  2 puff Inhalation BID Howerter, Justin B, DO   2 puff at 05/09/21 0916   naloxone (NARCAN) injection 0.4 mg  0.4 mg Intravenous PRN Howerter, Justin B, DO       oxyCODONE (Oxy IR/ROXICODONE) immediate release tablet 5 mg  5 mg Oral Q6H PRN Barb Merino, MD       promethazine (PHENERGAN) 12.5 mg in sodium chloride 0.9 % 50 mL IVPB  12.5 mg Intravenous Q6H PRN Barb Merino, MD 200 mL/hr  at 05/07/21 1555 12.5 mg at 05/07/21 1555   rosuvastatin (CRESTOR) tablet 40 mg  40 mg Oral QHS Howerter, Justin B, DO   40 mg at 05/08/21 2148   zolpidem (AMBIEN) tablet 5 mg  5 mg Oral QHS PRN Alvira Philips, RPH   5 mg at 05/09/21 0120     Discharge Medications: Please see discharge summary for a list of discharge medications.  Relevant Imaging Results:  Relevant Lab Results:   Additional Information SSN: [id number]. Pt is not vaccinated for covid.  Joanne Chars, LCSW

## 2021-05-09 NOTE — TOC Initial Note (Signed)
Transition of Care [facility]) - Initial/Assessment Note    Patient Details  Name: Hannah [name] MRN: [id number] Date of Birth: 1946/06/03  Transition of Care [facility]) CM/SW Contact:    Joanne Chars, LCSW Phone Number: 05/09/2021, 3:16 PM  Clinical Narrative:     CSW met with pt and husband [name] to discuss DC recommendation for SNF.  Permission given to speak with husband present.  They are agreeable to SNF, choice document given, permission given to send out referral in hub.  First choice is [facility].  PCP in place.  Pt is not vaccinated for covid.  Referral sent out in hub.               Expected Discharge Plan: Skilled Nursing Facility Barriers to Discharge: Continued Medical Work up, SNF Pending bed offer   Patient Goals and CMS Choice Patient states their goals for this hospitalization and ongoing recovery are:: "do for myself" CMS Medicare.gov Compare Post Acute Care list provided to:: Patient Choice offered to / list presented to : Patient  Expected Discharge Plan and Services Expected Discharge Plan: [place] Choice: [place] arrangements for the past 2 months: Single Family Home                                      Prior Living Arrangements/Services Living arrangements for the past 2 months: Single Family Home Lives with:: Spouse Patient language and need for interpreter reviewed:: Yes Do you feel safe going back to the place where you live?: Yes      Need for Family Participation in Patient Care: Yes (Comment) Care giver support system in place?: Yes (comment) Current home services: Other (comment) (none) Criminal Activity/Legal Involvement Pertinent to Current Situation/Hospitalization: No - Comment as needed  Activities of Daily Living Home Assistive Devices/Equipment: Walker (specify type) Risk manager with seat) ADL Screening (condition at time of admission) Patient's cognitive ability  adequate to safely complete daily activities?: Yes Is the patient deaf or have difficulty hearing?: Yes Does the patient have difficulty seeing, even when wearing glasses/contacts?: Yes Does the patient have difficulty concentrating, remembering, or making decisions?: No Patient able to express need for assistance with ADLs?: Yes Does the patient have difficulty dressing or bathing?: Yes Independently performs ADLs?: Yes (appropriate for developmental age) Does the patient have difficulty walking or climbing stairs?: Yes Weakness of Legs: Both Weakness of Arms/Hands: Both  Permission Sought/Granted Permission sought to share information with : Family Supports Permission granted to share information with : Yes, Verbal Permission Granted  Share Information with NAME: husband [name]           Emotional Assessment Appearance:: Appears stated age Attitude/Demeanor/Rapport: Engaged Affect (typically observed): Appropriate, Pleasant Orientation: : Oriented to Self, Oriented to Place, Oriented to  Time, Oriented to Situation Alcohol / Substance Use: Not Applicable Psych Involvement: No (comment)  Admission diagnosis:  Fall [W19.XXXA] CKD (chronic kidney disease) stage 4, GFR 15-29 ml/min (HCC) [N18.4] Closed right hip fracture (HCC) [S72.001A] Closed nondisplaced intertrochanteric fracture of right femur, initial encounter ([place]) [S72.144A] Fall, initial encounter [W19.XXXA] Patient Active Problem List   Diagnosis Date Noted   Closed right hip fracture ([place]) 05/06/2021   Right hip pain 05/06/2021   Pacemaker 11/15/2020   Primary osteoarthritis, left shoulder 10/27/2020   Tachycardia-bradycardia syndrome ([place]) 08/10/2020   Convulsion ([place]) 05/30/2020   Compression fracture  of L1 vertebra with routine healing 02/25/2020   Compression fracture of L1 lumbar vertebra, closed, initial encounter ([place]) 01/27/2020   Pain in left shoulder 03/26/2019   History of breast cancer in female  03/26/2019   Hypokalemia 02/20/2019   Acute exacerbation of CHF (congestive heart failure) ([place]) 02/19/2019   Pleural effusion on right 09/25/2018   Thrombocytopenia ([place]) 09/25/2018   Cholecystitis with cholelithiasis 07/07/2018   Prolonged Q-T interval on ECG 05/21/2018   PAF (paroxysmal atrial fibrillation) ([place]) 01/23/2018   Closed fracture of head of left humerus 09/09/2017   Syncope 09/06/2017   Seizure-like activity ([place]) 09/01/2017   AKI (acute kidney injury) ([place]) 09/01/2017   Adrenal insufficiency ([place]) 07/03/2016   Seizure ([place]) 06/29/2016   Chemotherapy-induced peripheral neuropathy ([place]) 04/27/2016   Chemotherapy-induced thrombocytopenia 04/06/2016   Osteomyelitis of toe of left foot ([place]) 04/06/2016   Mania ([place]) 03/05/2016   GERD (gastroesophageal reflux disease) 02/29/2016   Chemotherapy induced nausea and vomiting 02/24/2016   Sensorineural hearing loss (SNHL) of both ears 01/06/2016   Breast cancer of upper-inner quadrant of right female breast ([place]) 12/16/2015   Osteopenia of multiple sites 10/19/2015   CKD (chronic kidney disease), stage IV (HCC) 09/24/2013   Chronic diastolic CHF (congestive heart failure) ([place]) 09/24/2013   Acute on chronic combined systolic and diastolic congestive heart failure ([place]) 07/17/2013   Atrial tachycardia ([place]) 04/03/2013   CAD (coronary artery disease) 01/24/2011   Chronic coronary artery disease 01/24/2011   History of aortic valve replacement 10/27/2010   Hyperlipidemia    DOE (dyspnea on exertion)    Nonischemic cardiomyopathy ([place])    Anemia of chronic disease    Type 2 diabetes mellitus ([place])    Iron deficiency anemia    Allergic rhinitis 10/14/2009   Essential hypertension 05/12/2009   H/O atrial tachycardia 05/12/2009   Asthma 05/12/2009   PCP:  [name], DO Pharmacy:   [facility], [place] - [address] [place] [address] Phone: [phone] Fax:  [phone]  [facility] ([place], [place] [facility] [address] [place] [address] [place] [address] Phone: [phone] Fax: [phone]  [place], [place] [address] Phone: [phone] Fax: [phone]     Social Determinants of Health (SDOH) Interventions    Readmission Risk Interventions No flowsheet data found.

## 2021-05-09 NOTE — Plan of Care (Signed)

## 2021-05-09 NOTE — Care Management Important Message (Signed)
Important Message  Patient Details  Name: Hannah [name] MRN: [id number] Date of Birth: [birth year]   Medicare Important Message Given:  Yes     Joetta Manners 05/09/2021, 12:13 PM

## 2021-05-09 NOTE — Progress Notes (Signed)
Physical Therapy Treatment Patient Details Name: Hannah [name] MRN: [id number] DOB: 08-Jan-1946 Today's Date: 05/09/2021   History of Present Illness Pt is a 75y/o female admitted 11/26 with R intertrochanteric hip fracture after fall from standing.  S/P ORIF IM nail 11/27, WBAT.  PMH includes: CHF, afib, DM, asthma, stage IV CKD, breast CA, glaucoma.    PT Comments    Patient progressing slowly with PT able to assist initially with bed mobility, but not effectively and unable to tolerate standing due to pain and with continued L lateral lean to get off R hip in sitting.  She will continue to benefit from skilled PT in the acute setting and from follow up STSNF level rehab at d/c.    Recommendations for follow up therapy are one component of a multi-disciplinary discharge planning process, led by the attending physician.  Recommendations may be updated based on patient status, additional functional criteria and insurance authorization.  Follow Up Recommendations  Skilled nursing-short term rehab (<3 hours/day)     Assistance Recommended at Discharge Frequent or constant Supervision/Assistance  Equipment Recommendations  Wheelchair (measurements PT);Wheelchair cushion (measurements PT)    Recommendations for Other Services       Precautions / Restrictions Precautions Precautions: Fall Restrictions RLE Weight Bearing: Weight bearing as tolerated     Mobility  Bed Mobility Overal bed mobility: Needs Assistance Bed Mobility: Supine to Sit     Supine to sit: Mod assist;Max assist;+2 for physical assistance     General bed mobility comments: initially pt able to use L LE on bed to scoot hips minimally, but ineffectively so +2 A for using bed pad to lift and scoot to EOB    Transfers Overall transfer level: Needs assistance Equipment used: Rolling walker (2 wheels) Transfers: Sit to/from Stand;Bed to chair/wheelchair/BSC Sit to Stand: Mod assist;+2 physical assistance    Squat pivot transfers: Max assist;+2 physical assistance       General transfer comment: stood x 2 to RW, but too much pain R LE in standing to step to recliner and off balance leaning L to get off R him so used squat pivot with +2 max A    Ambulation/Gait                   Stairs             Wheelchair Mobility    Modified Rankin (Stroke Patients Only)       Balance Overall balance assessment: Needs assistance Sitting-balance support: Feet supported;Bilateral upper extremity supported Sitting balance-Leahy Scale: Poor Sitting balance - Comments: min to mod A for balance on EOB due to L lateral lean Postural control: Left lateral lean;Posterior lean Standing balance support: Bilateral upper extremity supported Standing balance-Leahy Scale: Poor Standing balance comment: UE support and mod A initially, but poor tolerated                            Cognition Arousal/Alertness: Awake/alert Behavior During Therapy: WFL for tasks assessed/performed Overall Cognitive Status: Impaired/Different from baseline Area of Impairment: Following commands;Attention;Problem solving;Safety/judgement                   Current Attention Level: Sustained   Following Commands: Follows one step commands with increased time;Follows one step commands inconsistently Safety/Judgement: Decreased awareness of safety   Problem Solving: Slow processing;Difficulty sequencing;Requires verbal cues;Decreased initiation General Comments: spouse reports letharagic this am, RN reports had Ambien at 2 am  Exercises General Exercises - Lower Extremity Ankle Circles/Pumps: AROM;Both;5 reps;Supine Heel Slides: AAROM;Both;5 reps;Supine    General Comments General comments (skin integrity, edema, etc.): spouse present and supportive, MD in to visit during PT session      Pertinent Vitals/Pain Faces Pain Scale: Hurts even more Pain Location: R LE with  movement Pain Descriptors / Indicators: Discomfort;Grimacing;Operative site guarding Pain Intervention(s): Monitored during session;Limited activity within patient's tolerance;Premedicated before session    Home Living                          Prior Function            PT Goals (current goals can now be found in the care plan section) Progress towards PT goals: Progressing toward goals    Frequency    Min 3X/week      PT Plan Current plan remains appropriate    Co-evaluation              AM-PAC PT "6 Clicks" Mobility   Outcome Measure  Help needed turning from your back to your side while in a flat bed without using bedrails?: Total Help needed moving from lying on your back to sitting on the side of a flat bed without using bedrails?: Total Help needed moving to and from a bed to a chair (including a wheelchair)?: Total Help needed standing up from a chair using your arms (e.g., wheelchair or bedside chair)?: Total Help needed to walk in hospital room?: Total Help needed climbing 3-5 steps with a railing? : Total 6 Click Score: 6    End of Session Equipment Utilized During Treatment: Gait belt Activity Tolerance: Patient limited by pain Patient left: in chair;with call bell/phone within reach;with chair alarm set;with family/visitor present   PT Visit Diagnosis: Unsteadiness on feet (R26.81);Muscle weakness (generalized) (M62.81);History of falling (Z91.81);Difficulty in walking, not elsewhere classified (R26.2);Pain Pain - Right/Left: Right Pain - part of body: Leg     Time: 2229-7989 PT Time Calculation (min) (ACUTE ONLY): 32 min  Charges:  $Therapeutic Exercise: 8-22 mins $Therapeutic Activity: 8-22 mins                     Hannah [name], PT Acute Rehabilitation Services QJJHE:[phone] Office:[phone] 05/09/2021    Reginia Naas 05/09/2021, 12:59 PM

## 2021-05-09 NOTE — Progress Notes (Signed)
   Subjective:  Patient reports pain as mild. Tolerating diet. Worked with PT.  Objective:   VITALS:   Vitals:   05/08/21 0900 05/08/21 1545 05/08/21 1615 05/08/21 2000  BP: (!) 125/34 104/64 (!) 133/35 (!) 120/45  Pulse: 87 81 73   Resp: 20 20 20    Temp:  98.1 F (36.7 C) 98.2 F (36.8 C) 98.2 F (36.8 C)  TempSrc:  Oral Oral Oral  SpO2: 98% 100% (!) 6%   Weight:      Height:        Neurologically intact SILT all distributions right foot, Fires EHL/TA/GS 2+ DP pulse   Lab Results  Component Value Date   WBC 12.4 (H) 05/09/2021   HGB 8.3 (L) 05/09/2021   HCT 27.2 (L) 05/09/2021   MCV 93.5 05/09/2021   PLT 93 (L) 05/09/2021     Assessment/Plan:  2 Days Post-Op right hip CMN for IT fracture, doing well  - Expected postop acute blood loss anemia - will monitor for symptoms - Patient to work with PT/OT to optimize mobilization safely - DVT ppx - SCDs, ambulation, Eliquis - Postoperative Abx: Ancef x 2 additional doses given - WBAT operative extremity - Pain control - multimodal pain management, ATC acetaminophen in conjunction with as needed narcotic (oxycodone), although this should be minimized with other modalities  - Discharge planning pending CM, appreciate coordination    Hannah [name] 05/09/2021, 7:38 AM

## 2021-05-09 NOTE — Progress Notes (Signed)
PROGRESS NOTE    ROANNA REAVES  OFB:[id number] DOB: 09/07/1945 DOA: 05/06/2021 PCP: [name], DO    Brief Narrative:  75 year old with history of chronic diastolic heart failure and persistent A. fib on Eliquis, type 2 diabetes on insulin, moderate persistent asthma, stage IV chronic kidney disease with baseline creatinine about 2.2-2.5 presented with fall at home, right hip pain and found to have right intertrochanteric hip fracture.   Assessment & Plan:   Principal Problem:   Closed right hip fracture (HCC) Active Problems:   Allergic rhinitis   Asthma   Hyperlipidemia   Type 2 diabetes mellitus (HCC)   CKD (chronic kidney disease), stage IV (HCC)   Chronic diastolic CHF (congestive heart failure) (HCC)   GERD (gastroesophageal reflux disease)   PAF (paroxysmal atrial fibrillation) (HCC)   Right hip pain  Close traumatic right intertrochanteric hip fracture Status post ORIF, IM nailing Dr. Sammuel Hines 11/27.   Adequate pain medications with IV and oral opiates.  We will add oral oxycodone today. Weightbearing as tolerated. PT/OT DVT prophylaxis, Eliquis.  Renally dosed. Anticipate discharge to skilled nursing facility after 24 to 48 hours once renal functions normalized.  Diabetes type 2 uncontrolled with hyperglycemia: On insulin regimen at home.  Blood sugars are better today on home medications.  Continue.  Acute kidney injury with CKD stage IV/hyperkalemia: Kidney function worsened today probably due to intraoperative hemodynamic disturbances.  Evidence of hemoconcentration. Seen by nephrology. On IV fluids, increasing infusion rate today.  Also giving albumin transfusions.  Anemia of acute blood loss in the patient with anemia of chronic disease: Baseline hemoglobin 9-10.  Expected from blood loss from long bone fracture and perioperative blood loss. Hemoglobin was 7.  -1 unit PRBC transfusion with appropriate response.  Gets Aranesp injection in the  office.  Chronic diastolic heart failure: Well compensated and euvolemic.  Gentle IV fluids overnight.  Paroxysmal A. fib: Rate controlled on metoprolol.  Continued metoprolol and amiodarone.  She is sinus rhythm.  Therapeutic on Eliquis.    DVT prophylaxis: apixaban (ELIQUIS) tablet 2.5 mg Start: 05/08/21 1000 SCDs Start: 05/06/21 2010 apixaban (ELIQUIS) tablet 2.5 mg   Code Status: Full code Family Communication: Husband at the bedside. Disposition Plan: Status is: Inpatient  Remains inpatient appropriate because: Pain management.  Worsening kidney functions.  Needs to be stabilized before discharge.  Consultants:  Orthopedics  Procedures:  Right hip IM nail  Antimicrobials:  None   Subjective:  Patient seen and examined.  Husband at the bedside.  She was initially very sleepy but woke up and work with PT OT.  Was very difficult to get to the chair from bed, however patient tells me she did better than yesterday.  Denies any nausea vomiting.  Does have significant pain and anxiety on mobilizing right leg.  Objective: Vitals:   05/08/21 1545 05/08/21 1615 05/08/21 2000 05/09/21 0746  BP: 104/64 (!) 133/35 (!) 120/45 (!) 132/29  Pulse: 81 73  77  Resp: 20 20  15   Temp: 98.1 F (36.7 C) 98.2 F (36.8 C) 98.2 F (36.8 C) 98.3 F (36.8 C)  TempSrc: Oral Oral Oral Oral  SpO2: 100% (!) 6%  99%  Weight:      Height:        Intake/Output Summary (Last 24 hours) at 05/09/2021 1334 Last data filed at 05/08/2021 1930 Gross per 24 hour  Intake 648.77 ml  Output --  Net 648.77 ml    Filed Weights   05/06/21 1832  Weight:  63.5 kg    Examination:  General exam: Appears calm and comfortable at rest.  Moderate distress on mobility with pain on the right hip. Respiratory system: Clear to auscultation. Respiratory effort normal. Cardiovascular system: S1 & S2 heard, RRR. No JVD, murmurs, rubs, gallops or clicks. No pedal edema. Gastrointestinal system: Abdomen is  nondistended, soft and nontender. No organomegaly or masses felt. Normal bowel sounds heard. Central nervous system: Alert and oriented. No focal neurological deficits. Extremities: Symmetric 5 x 5 power. Right lateral thigh incision clean and dry.  Mild tenderness incision site.  No drainage.  Distal neurovascular status intact.    Data Reviewed: I have personally reviewed following labs and imaging studies  CBC: Recent Labs  Lab 05/06/21 1824 05/07/21 0529 05/08/21 0635 05/09/21 0142  WBC 6.2 9.3 13.5* 12.4*  NEUTROABS 4.6  --  11.5* 10.0*  HGB 10.5* 8.5* 7.0* 8.3*  HCT 35.2* 30.0* 23.8* 27.2*  MCV 96.4 98.7 98.3 93.5  PLT 129* 128* 127* 93*   Basic Metabolic Panel: Recent Labs  Lab 05/06/21 1824 05/06/21 2018 05/07/21 0529 05/08/21 0635 05/09/21 0142  NA 135  --  134* 135 136  K 4.2  --  5.0 5.3* 4.8  CL 102  --  103 103 105  CO2 24  --  24 23 22   GLUCOSE 166*  --  155* 202* 132*  BUN 26*  --  27* 43* 57*  CREATININE 2.25*  --  2.33* 3.49* 4.24*  CALCIUM 9.3  --  8.7* 8.5* 8.4*  MG  --  2.0 1.9 2.0  --    GFR: Estimated Creatinine Clearance: 9.9 mL/min (A) (by C-G formula based on SCr of 4.24 mg/dL (H)). Liver Function Tests: Recent Labs  Lab 05/07/21 0529  AST 67*  ALT 45*  ALKPHOS 58  BILITOT 0.8  PROT 5.3*  ALBUMIN 2.5*   No results for input(s): LIPASE, AMYLASE in the last 168 hours. No results for input(s): AMMONIA in the last 168 hours. Coagulation Profile: Recent Labs  Lab 05/06/21 1824  INR 1.3*   Cardiac Enzymes: No results for input(s): CKTOTAL, CKMB, CKMBINDEX, TROPONINI in the last 168 hours. BNP (last 3 results) No results for input(s): PROBNP in the last 8760 hours. HbA1C: No results for input(s): HGBA1C in the last 72 hours. CBG: Recent Labs  Lab 05/08/21 1141 05/08/21 1831 05/09/21 0011 05/09/21 0911 05/09/21 1155  GLUCAP 184* 90 121* 126* 152*   Lipid Profile: No results for input(s): CHOL, HDL, LDLCALC, TRIG, CHOLHDL,  LDLDIRECT in the last 72 hours. Thyroid Function Tests: No results for input(s): TSH, T4TOTAL, FREET4, T3FREE, THYROIDAB in the last 72 hours. Anemia Panel: No results for input(s): VITAMINB12, FOLATE, FERRITIN, TIBC, IRON, RETICCTPCT in the last 72 hours. Sepsis Labs: No results for input(s): PROCALCITON, LATICACIDVEN in the last 168 hours.  Recent Results (from the past 240 hour(s))  Resp Panel by RT-PCR (Flu A&B, Covid) Nasopharyngeal Swab     Status: None   Collection Time: 05/06/21  8:18 PM   Specimen: Nasopharyngeal Swab; Nasopharyngeal(NP) swabs in vial transport medium  Result Value Ref Range Status   SARS Coronavirus 2 by RT PCR NEGATIVE NEGATIVE Final    Comment: (NOTE) SARS-CoV-2 target nucleic acids are NOT DETECTED.  The SARS-CoV-2 RNA is generally detectable in upper respiratory specimens during the acute phase of infection. The lowest concentration of SARS-CoV-2 viral copies this assay can detect is 138 copies/mL. A negative result does not preclude SARS-Cov-2 infection and should not be used  as the sole basis for treatment or other patient management decisions. A negative result may occur with  improper specimen collection/handling, submission of specimen other than nasopharyngeal swab, presence of viral mutation(s) within the areas targeted by this assay, and inadequate number of viral copies(<138 copies/mL). A negative result must be combined with clinical observations, patient history, and epidemiological information. The expected result is Negative.  Fact Sheet for Patients:  EntrepreneurPulse.com.au  Fact Sheet for Healthcare Providers:  IncredibleEmployment.be  This test is no t yet approved or cleared by the [place] FDA and  has been authorized for detection and/or diagnosis of SARS-CoV-2 by FDA under an Emergency Use Authorization (EUA). This EUA will remain  in effect (meaning this test can be used) for the  duration of the COVID-19 declaration under Section 564(b)(1) of the Act, 21 U.S.C.section 360bbb-3(b)(1), unless the authorization is terminated  or revoked sooner.       Influenza A by PCR NEGATIVE NEGATIVE Final   Influenza B by PCR NEGATIVE NEGATIVE Final    Comment: (NOTE) The Xpert Xpress SARS-CoV-2/FLU/RSV plus assay is intended as an aid in the diagnosis of influenza from Nasopharyngeal swab specimens and should not be used as a sole basis for treatment. Nasal washings and aspirates are unacceptable for Xpert Xpress SARS-CoV-2/FLU/RSV testing.  Fact Sheet for Patients: EntrepreneurPulse.com.au  Fact Sheet for Healthcare Providers: IncredibleEmployment.be  This test is not yet approved or cleared by the [place] FDA and has been authorized for detection and/or diagnosis of SARS-CoV-2 by FDA under an Emergency Use Authorization (EUA). This EUA will remain in effect (meaning this test can be used) for the duration of the COVID-19 declaration under Section 564(b)(1) of the Act, 21 U.S.C. section 360bbb-3(b)(1), unless the authorization is terminated or revoked.  Performed at [facility], [place] [address] [place], [place] [address]   Surgical pcr screen     Status: None   Collection Time: 05/07/21  9:28 AM   Specimen: Nasal Mucosa; Nasal Swab  Result Value Ref Range Status   MRSA, PCR NEGATIVE NEGATIVE Final   Staphylococcus aureus NEGATIVE NEGATIVE Final    Comment: (NOTE) The Xpert SA Assay (FDA approved for NASAL specimens in patients 61 years of age and older), is one component of a comprehensive surveillance program. It is not intended to diagnose infection nor to guide or monitor treatment. Performed at [facility], [place] [address], [place], [place] [address]          Radiology Studies: No results found.      Scheduled Meds:  amiodarone  200 mg Oral Daily   apixaban  2.5 mg Oral BID   DULoxetine   20 mg Oral Daily   ezetimibe  10 mg Oral Daily   famotidine  20 mg Oral Daily   insulin aspart  0-9 Units Subcutaneous Q6H   insulin aspart  10 Units Subcutaneous TID WC   insulin glargine-yfgn  24 Units Subcutaneous QHS   metoprolol tartrate  12.5 mg Oral Daily   mometasone-formoterol  2 puff Inhalation BID   rosuvastatin  40 mg Oral QHS   Continuous Infusions:  sodium chloride 75 mL/hr at 05/09/21 1202   albumin human     promethazine (PHENERGAN) injection (IM or IVPB) 12.5 mg (05/07/21 1555)     LOS: 3 days    Time spent: 30 minutes    Barb Merino, MD [facility] Pager [phone]

## 2021-05-10 ENCOUNTER — Encounter (HOSPITAL_COMMUNITY): Payer: Self-pay | Admitting: Orthopaedic Surgery

## 2021-05-10 LAB — CBC WITH DIFFERENTIAL/PLATELET
Abs Immature Granulocytes: 0.05 10*3/uL (ref 0.00–0.07)
Basophils Absolute: 0 10*3/uL (ref 0.0–0.1)
Basophils Relative: 0 %
Eosinophils Absolute: 0 10*3/uL (ref 0.0–0.5)
Eosinophils Relative: 0 %
HCT: 22.8 % — ABNORMAL LOW (ref 36.0–46.0)
Hemoglobin: 7 g/dL — ABNORMAL LOW (ref 12.0–15.0)
Immature Granulocytes: 1 %
Lymphocytes Relative: 7 %
Lymphs Abs: 0.4 10*3/uL — ABNORMAL LOW (ref 0.7–4.0)
MCH: 29.3 pg (ref 26.0–34.0)
MCHC: 30.7 g/dL (ref 30.0–36.0)
MCV: 95.4 fL (ref 80.0–100.0)
Monocytes Absolute: 0.8 10*3/uL (ref 0.1–1.0)
Monocytes Relative: 12 %
Neutro Abs: 5.4 10*3/uL (ref 1.7–7.7)
Neutrophils Relative %: 80 %
Platelets: 68 10*3/uL — ABNORMAL LOW (ref 150–400)
RBC: 2.39 MIL/uL — ABNORMAL LOW (ref 3.87–5.11)
RDW: 17.1 % — ABNORMAL HIGH (ref 11.5–15.5)
WBC: 6.8 10*3/uL (ref 4.0–10.5)
nRBC: 0 % (ref 0.0–0.2)

## 2021-05-10 LAB — RENAL FUNCTION PANEL
Albumin: 2.7 g/dL — ABNORMAL LOW (ref 3.5–5.0)
Anion gap: 8 (ref 5–15)
BUN: 64 mg/dL — ABNORMAL HIGH (ref 8–23)
CO2: 19 mmol/L — ABNORMAL LOW (ref 22–32)
Calcium: 8.2 mg/dL — ABNORMAL LOW (ref 8.9–10.3)
Chloride: 107 mmol/L (ref 98–111)
Creatinine, Ser: 4.51 mg/dL — ABNORMAL HIGH (ref 0.44–1.00)
GFR, Estimated: 10 mL/min — ABNORMAL LOW (ref >60)
Glucose, Bld: 186 mg/dL — ABNORMAL HIGH (ref 70–99)
Phosphorus: 5 mg/dL — ABNORMAL HIGH (ref 2.5–4.6)
Potassium: 4.8 mmol/L (ref 3.5–5.1)
Sodium: 134 mmol/L — ABNORMAL LOW (ref 135–145)

## 2021-05-10 LAB — GLUCOSE, CAPILLARY
Glucose-Capillary: 139 mg/dL — ABNORMAL HIGH (ref 70–99)
Glucose-Capillary: 155 mg/dL — ABNORMAL HIGH (ref 70–99)
Glucose-Capillary: 177 mg/dL — ABNORMAL HIGH (ref 70–99)
Glucose-Capillary: 184 mg/dL — ABNORMAL HIGH (ref 70–99)
Glucose-Capillary: 99 mg/dL (ref 70–99)

## 2021-05-10 LAB — HEMOGLOBIN AND HEMATOCRIT, BLOOD
HCT: 25.9 % — ABNORMAL LOW (ref 36.0–46.0)
Hemoglobin: 8.1 g/dL — ABNORMAL LOW (ref 12.0–15.0)

## 2021-05-10 LAB — PREPARE RBC (CROSSMATCH)

## 2021-05-10 MED ORDER — SODIUM CHLORIDE 0.9% IV SOLUTION: Freq: Once | INTRAVENOUS | Status: AC

## 2021-05-10 NOTE — TOC Progression Note (Signed)
Transition of Care [facility]) - Progression Note    Patient Details  Name: LAPORSCHE HOEGER MRN: [id number] Date of Birth: 1946/05/05  Transition of Care [facility]) CM/SW Contact  Joanne Chars, LCSW Phone Number: 05/10/2021, 2:43 PM  Clinical Narrative:   Pt received bed offer from [facility].  CSW spoke with pt, husband, and pt daughter.  They had questions about pt needing to quarantine at [facility] due to not being vaccinated for covid--CSW spoke with Tye [place] at [facility], pt does have 10 day quarantine, but husband can be with her during quarantine.  Pt, husband, daughter all in agreement to accept bed offer at [facility].      Expected Discharge Plan: [place] Barriers to Discharge: Continued Medical Work up, SNF Pending bed offer  Expected Discharge Plan and Services Expected Discharge Plan: [place] Choice: [place] arrangements for the past 2 months: Single Family Home                                       Social Determinants of Health (SDOH) Interventions    Readmission Risk Interventions No flowsheet data found.

## 2021-05-10 NOTE — Progress Notes (Signed)
PROGRESS NOTE    Hannah [name]  IEP:[id number] DOB: [birth year] DOA: 05/06/2021 PCP: [name], DO    Brief Narrative:  75 year old with history of chronic diastolic heart failure and persistent A. fib on Eliquis, type 2 diabetes on insulin, moderate persistent asthma, stage IV chronic kidney disease with baseline creatinine about 2.2-2.5 presented with fall at home, right hip pain and found to have right intertrochanteric hip fracture.   Assessment & Plan:   Principal Problem:   Closed right hip fracture (HCC) Active Problems:   Allergic rhinitis   Asthma   Hyperlipidemia   Type 2 diabetes mellitus (HCC)   CKD (chronic kidney disease), stage IV (HCC)   Chronic diastolic CHF (congestive heart failure) (HCC)   GERD (gastroesophageal reflux disease)   PAF (paroxysmal atrial fibrillation) (HCC)   Right hip pain  Close traumatic right intertrochanteric hip fracture Status post ORIF, IM nailing Dr. Sammuel Hines 11/27.   Adequate pain medications with oral opiates.  Using oral pain medications and mobilizing with PT OT. Weightbearing as tolerated. PT/OT DVT prophylaxis, Eliquis.  Renally dosed. Discharge to skilled nursing facility pending renal function improvement and stabilization.  Diabetes type 2 uncontrolled with hyperglycemia: On insulin regimen at home.  Blood sugars are better today on home medications.  Continue.  Acute kidney injury with CKD stage IV/hyperkalemia: Kidney function continues to deteriorate probably due to intraoperative hemodynamic disturbances.   Seen by nephrology. Patient remains on maintenance IV fluids.  Treated with albumin.  Urine output is adequate.  Recorded 900 mL last 24 hours.  Potassium is normal.  Anemia of acute blood loss in the patient with anemia of chronic disease: Baseline hemoglobin 9-10.  Expected from blood loss from long bone fracture and perioperative blood loss. Hemoglobin 7-1 unit PRBC transfusion 11/28-7.8-7-we will order 1  unit of PRBC transfusion today. Gets Aranesp injection in the office.  Chronic diastolic heart failure: Well compensated and euvolemic.  Currently on fluid.  Paroxysmal A. fib: Rate controlled on metoprolol.  Continued metoprolol and amiodarone.  She is sinus rhythm.  Therapeutic on Eliquis.    DVT prophylaxis: apixaban (ELIQUIS) tablet 2.5 mg Start: 05/08/21 1000 SCDs Start: 05/06/21 2010 apixaban (ELIQUIS) tablet 2.5 mg   Code Status: Full code Family Communication: Husband at the bedside. Disposition Plan: Status is: Inpatient  Remains inpatient appropriate because: Pain management.  Worsening kidney functions.  Needs to be stabilized before discharge.  Consultants:  Orthopedics Nephrology  Procedures:  Right hip IM nail  Antimicrobials:  None   Subjective:  Patient seen and examined.  She herself denies any complaints.  Not needed any pain medications overnight. Husband at the bedside.  Yesterday all day she was sleepy in and out after receiving late administration of Ambien for sleep.  Today she is more awake alert and interactive. Denies any nausea vomiting or abdominal pain or cramping. Urine output is adequate.  Pain is controlled at rest.  Objective: Vitals:   05/10/21 0833 05/10/21 0837 05/10/21 1105 05/10/21 1126  BP: (!) 98/48  (!) 130/45   Pulse: 82  79   Resp: 20  20   Temp: 98.7 F (37.1 C)   99.1 F (37.3 C)  TempSrc: Oral   Oral  SpO2: 93% 95% 97%   Weight:      Height:        Intake/Output Summary (Last 24 hours) at 05/10/2021 1141 Last data filed at 05/10/2021 0536 Gross per 24 hour  Intake 120 ml  Output 930 ml  Net -  810 ml    Filed Weights   05/06/21 1832  Weight: 63.5 kg    Examination:  General exam: Appears calm and comfortable at rest.   Respiratory system: Clear to auscultation. Respiratory effort normal. Cardiovascular system: S1 & S2 heard, RRR. No JVD, murmurs, rubs, gallops or clicks. No pedal edema. Gastrointestinal  system: Abdomen is nondistended, soft and nontender. No organomegaly or masses felt. Normal bowel sounds heard. Central nervous system: Alert and oriented. No focal neurological deficits. Extremities: Symmetric 5 x 5 power. Right lateral thigh incisions clean and dry.  Minimal swelling as expected from surgery.  No drainage.  Distal neurovascular status intact. Pure wick with clear urine.    Data Reviewed: I have personally reviewed following labs and imaging studies  CBC: Recent Labs  Lab 05/06/21 1824 05/07/21 0529 05/08/21 0635 05/09/21 0142 05/10/21 0252  WBC 6.2 9.3 13.5* 12.4* 6.8  NEUTROABS 4.6  --  11.5* 10.0* 5.4  HGB 10.5* 8.5* 7.0* 8.3* 7.0*  HCT 35.2* 30.0* 23.8* 27.2* 22.8*  MCV 96.4 98.7 98.3 93.5 95.4  PLT 129* 128* 127* 93* 68*   Basic Metabolic Panel: Recent Labs  Lab 05/06/21 1824 05/06/21 2018 05/07/21 0529 05/08/21 0635 05/09/21 0142 05/10/21 0252  NA 135  --  134* 135 136 134*  K 4.2  --  5.0 5.3* 4.8 4.8  CL 102  --  103 103 105 107  CO2 24  --  24 23 22  19*  GLUCOSE 166*  --  155* 202* 132* 186*  BUN 26*  --  27* 43* 57* 64*  CREATININE 2.25*  --  2.33* 3.49* 4.24* 4.51*  CALCIUM 9.3  --  8.7* 8.5* 8.4* 8.2*  MG  --  2.0 1.9 2.0  --   --   PHOS  --   --   --   --   --  5.0*   GFR: Estimated Creatinine Clearance: 9.3 mL/min (A) (by C-G formula based on SCr of 4.51 mg/dL (H)). Liver Function Tests: Recent Labs  Lab 05/07/21 0529 05/10/21 0252  AST 67*  --   ALT 45*  --   ALKPHOS 58  --   BILITOT 0.8  --   PROT 5.3*  --   ALBUMIN 2.5* 2.7*   No results for input(s): LIPASE, AMYLASE in the last 168 hours. No results for input(s): AMMONIA in the last 168 hours. Coagulation Profile: Recent Labs  Lab 05/06/21 1824  INR 1.3*   Cardiac Enzymes: No results for input(s): CKTOTAL, CKMB, CKMBINDEX, TROPONINI in the last 168 hours. BNP (last 3 results) No results for input(s): PROBNP in the last 8760 hours. HbA1C: No results for  input(s): HGBA1C in the last 72 hours. CBG: Recent Labs  Lab 05/09/21 0911 05/09/21 1155 05/09/21 1811 05/09/21 2358 05/10/21 0533  GLUCAP 126* 152* 113* 173* 155*   Lipid Profile: No results for input(s): CHOL, HDL, LDLCALC, TRIG, CHOLHDL, LDLDIRECT in the last 72 hours. Thyroid Function Tests: No results for input(s): TSH, T4TOTAL, FREET4, T3FREE, THYROIDAB in the last 72 hours. Anemia Panel: No results for input(s): VITAMINB12, FOLATE, FERRITIN, TIBC, IRON, RETICCTPCT in the last 72 hours. Sepsis Labs: No results for input(s): PROCALCITON, LATICACIDVEN in the last 168 hours.  Recent Results (from the past 240 hour(s))  Resp Panel by RT-PCR (Flu A&B, Covid) Nasopharyngeal Swab     Status: None   Collection Time: 05/06/21  8:18 PM   Specimen: Nasopharyngeal Swab; Nasopharyngeal(NP) swabs in vial transport medium  Result Value Ref Range  Status   SARS Coronavirus 2 by RT PCR NEGATIVE NEGATIVE Final    Comment: (NOTE) SARS-CoV-2 target nucleic acids are NOT DETECTED.  The SARS-CoV-2 RNA is generally detectable in upper respiratory specimens during the acute phase of infection. The lowest concentration of SARS-CoV-2 viral copies this assay can detect is 138 copies/mL. A negative result does not preclude SARS-Cov-2 infection and should not be used as the sole basis for treatment or other patient management decisions. A negative result may occur with  improper specimen collection/handling, submission of specimen other than nasopharyngeal swab, presence of viral mutation(s) within the areas targeted by this assay, and inadequate number of viral copies(<138 copies/mL). A negative result must be combined with clinical observations, patient history, and epidemiological information. The expected result is Negative.  Fact Sheet for Patients:  EntrepreneurPulse.com.au  Fact Sheet for Healthcare Providers:  IncredibleEmployment.be  This test is no  t yet approved or cleared by the [place] FDA and  has been authorized for detection and/or diagnosis of SARS-CoV-2 by FDA under an Emergency Use Authorization (EUA). This EUA will remain  in effect (meaning this test can be used) for the duration of the COVID-19 declaration under Section 564(b)(1) of the Act, 21 U.S.C.section 360bbb-3(b)(1), unless the authorization is terminated  or revoked sooner.       Influenza A by PCR NEGATIVE NEGATIVE Final   Influenza B by PCR NEGATIVE NEGATIVE Final    Comment: (NOTE) The Xpert Xpress SARS-CoV-2/FLU/RSV plus assay is intended as an aid in the diagnosis of influenza from Nasopharyngeal swab specimens and should not be used as a sole basis for treatment. Nasal washings and aspirates are unacceptable for Xpert Xpress SARS-CoV-2/FLU/RSV testing.  Fact Sheet for Patients: EntrepreneurPulse.com.au  Fact Sheet for Healthcare Providers: IncredibleEmployment.be  This test is not yet approved or cleared by the [place] FDA and has been authorized for detection and/or diagnosis of SARS-CoV-2 by FDA under an Emergency Use Authorization (EUA). This EUA will remain in effect (meaning this test can be used) for the duration of the COVID-19 declaration under Section 564(b)(1) of the Act, 21 U.S.C. section 360bbb-3(b)(1), unless the authorization is terminated or revoked.  Performed at [facility], [place] [address], [place], [place] [address]   Surgical pcr screen     Status: None   Collection Time: 05/07/21  9:28 AM   Specimen: Nasal Mucosa; Nasal Swab  Result Value Ref Range Status   MRSA, PCR NEGATIVE NEGATIVE Final   Staphylococcus aureus NEGATIVE NEGATIVE Final    Comment: (NOTE) The Xpert SA Assay (FDA approved for NASAL specimens in patients 51 years of age and older), is one component of a comprehensive surveillance program. It is not intended to diagnose infection nor to guide or  monitor treatment. Performed at [facility], [place] [address], [place], [place] [address]          Radiology Studies: No results found.      Scheduled Meds:  amiodarone  200 mg Oral Daily   apixaban  2.5 mg Oral BID   DULoxetine  20 mg Oral Daily   ezetimibe  10 mg Oral Daily   famotidine  20 mg Oral Daily   insulin aspart  0-9 Units Subcutaneous Q6H   insulin aspart  10 Units Subcutaneous TID WC   insulin glargine-yfgn  24 Units Subcutaneous QHS   metoprolol tartrate  12.5 mg Oral Daily   mometasone-formoterol  2 puff Inhalation BID   rosuvastatin  40 mg Oral  QHS   Continuous Infusions:  sodium chloride 100 mL/hr at 05/10/21 0547   promethazine (PHENERGAN) injection (IM or IVPB) 12.5 mg (05/07/21 1555)     LOS: 4 days    Time spent: 30 minutes    Barb Merino, MD [facility] Pager [phone]

## 2021-05-10 NOTE — Plan of Care (Signed)

## 2021-05-10 NOTE — Progress Notes (Signed)
[name] NEPHROLOGY PROGRESS NOTE  Assessment/ Plan: Pt is a 75 y.o. yo female  with history of type 2 diabetes, hypertension, CAD status post CABG, A. fib on Eliquis, grade 2 diastolic dysfunction, CKD stage IV with baseline serum creatinine level seems to be between 2.2-2.8 who was presented after fall at home with right hip intertrochanteric fracture, seen as a consultation for the evaluation of AKI on CKD.  # Acute kidney injury on CKD stage IV likely hemodynamically mediated in the setting of hypotension and anemia after the surgery.  UA with many bacteria and chronic proteinuria.  Placed external urinary catheter with around 900 cc of urine output recorded.  She is getting another unit of blood transfusion today.  I will minimize IV fluid to 50 cc an hour and encouraged her to increase oral intake. No other renal insult including IV contrast, NSAIDs, ACE inhibitor or ARB noted. Hopefully the creatinine will plateau in next 24 to 48 hours.  No need for dialysis. Continue strict ins and outs and daily lab.     #Hyperkalemia: Potassium level improved with medical management.   #Anemia due to acute blood loss from the surgery.  She may also have underlying anemia due to CKD.  Further drop in hemoglobin today therefore receiving another unit of blood transfusion.  #Close right intertrochanteric hip fracture status post ORIF on 11/27.  Pain management, DVT prophylaxis, OT PT evaluation.  Discussed with the primary team.  Subjective: Seen and examined at bedside.  Patient reported feeling good.  Denies nausea, vomiting, chest pain, shortness of breath.  Urine output around 930 cc recorded.  Her husband at bedside. Objective Vital signs in last 24 hours: Vitals:   05/10/21 0837 05/10/21 1105 05/10/21 1126 05/10/21 1141  BP:  (!) 130/45  (!) 133/40  Pulse:  79  81  Resp:  20  20  Temp:   99.1 F (37.3 C) 98.8 F (37.1 C)  TempSrc:   Oral   SpO2: 95% 97%    Weight:       Height:       Weight change:   Intake/Output Summary (Last 24 hours) at 05/10/2021 1254 Last data filed at 05/10/2021 0536 Gross per 24 hour  Intake 120 ml  Output 930 ml  Net -810 ml       Labs: Basic Metabolic Panel: Recent Labs  Lab 05/08/21 0635 05/09/21 0142 05/10/21 0252  NA 135 136 134*  K 5.3* 4.8 4.8  CL 103 105 107  CO2 23 22 19*  GLUCOSE 202* 132* 186*  BUN 43* 57* 64*  CREATININE 3.49* 4.24* 4.51*  CALCIUM 8.5* 8.4* 8.2*  PHOS  --   --  5.0*   Liver Function Tests: Recent Labs  Lab 05/07/21 0529 05/10/21 0252  AST 67*  --   ALT 45*  --   ALKPHOS 58  --   BILITOT 0.8  --   PROT 5.3*  --   ALBUMIN 2.5* 2.7*   No results for input(s): LIPASE, AMYLASE in the last 168 hours. No results for input(s): AMMONIA in the last 168 hours. CBC: Recent Labs  Lab 05/06/21 1824 05/07/21 0529 05/08/21 0635 05/09/21 0142 05/10/21 0252  WBC 6.2 9.3 13.5* 12.4* 6.8  NEUTROABS 4.6  --  11.5* 10.0* 5.4  HGB 10.5* 8.5* 7.0* 8.3* 7.0*  HCT 35.2* 30.0* 23.8* 27.2* 22.8*  MCV 96.4 98.7 98.3 93.5 95.4  PLT 129* 128* 127* 93* 68*   Cardiac Enzymes: No results for input(s):  CKTOTAL, CKMB, CKMBINDEX, TROPONINI in the last 168 hours. CBG: Recent Labs  Lab 05/09/21 1155 05/09/21 1811 05/09/21 2358 05/10/21 0533 05/10/21 1226  GLUCAP 152* 113* 173* 155* 139*    Iron Studies: No results for input(s): IRON, TIBC, TRANSFERRIN, FERRITIN in the last 72 hours. Studies/Results: No results found.  Medications: Infusions:  sodium chloride 100 mL/hr at 05/10/21 0547   promethazine (PHENERGAN) injection (IM or IVPB) 12.5 mg (05/07/21 1555)    Scheduled Medications:  amiodarone  200 mg Oral Daily   apixaban  2.5 mg Oral BID   DULoxetine  20 mg Oral Daily   ezetimibe  10 mg Oral Daily   famotidine  20 mg Oral Daily   insulin aspart  0-9 Units Subcutaneous Q6H   insulin aspart  10 Units Subcutaneous TID WC   insulin glargine-yfgn  24 Units Subcutaneous QHS    metoprolol tartrate  12.5 mg Oral Daily   mometasone-formoterol  2 puff Inhalation BID   rosuvastatin  40 mg Oral QHS    have reviewed scheduled and prn medications.  Physical Exam: General:NAD, comfortable Heart:RRR, s1s2 nl Lungs:clear b/l, no crackle Abdomen:soft, Non-tender, non-distended Extremities:No edema Neurology: Alert, awake and following commands.  [name] Hannah [name] Hannah [name] 05/10/2021,12:54 PM  LOS: 4 days

## 2021-05-11 ENCOUNTER — Inpatient Hospital Stay (HOSPITAL_COMMUNITY): Payer: Medicare Other

## 2021-05-11 ENCOUNTER — Inpatient Hospital Stay: Payer: Medicare Other | Attending: Adult Health

## 2021-05-11 ENCOUNTER — Ambulatory Visit (INDEPENDENT_AMBULATORY_CARE_PROVIDER_SITE_OTHER): Payer: Medicare Other

## 2021-05-11 DIAGNOSIS — I495 Sick sinus syndrome: Secondary | ICD-10-CM

## 2021-05-11 LAB — CUP PACEART REMOTE DEVICE CHECK
Battery Remaining Percentage: 95 %
Brady Statistic RA Percent Paced: 81 %
Brady Statistic RV Percent Paced: 2 %
Date Time Interrogation Session: 20221201094007
Implantable Lead Implant Date: 20220302
Implantable Lead Implant Date: 20220302
Implantable Lead Location: 753859
Implantable Lead Location: 753860
Implantable Lead Model: 377171
Implantable Lead Model: 377171
Implantable Lead Serial Number: 8000005446
Implantable Lead Serial Number: 8000143627
Implantable Pulse Generator Implant Date: 20220302
Lead Channel Impedance Value: 507 Ohm
Lead Channel Impedance Value: 624 Ohm
Lead Channel Sensing Intrinsic Amplitude: 1.9 mV
Lead Channel Sensing Intrinsic Amplitude: 7.7 mV
Lead Channel Setting Pacing Amplitude: 2 V
Lead Channel Setting Pacing Amplitude: 2.6 V
Lead Channel Setting Pacing Pulse Width: 0.4 ms
Pulse Gen Model: 407145
Pulse Gen Serial Number: 70049081

## 2021-05-11 LAB — CBC WITH DIFFERENTIAL/PLATELET
Abs Immature Granulocytes: 0.05 10*3/uL (ref 0.00–0.07)
Basophils Absolute: 0 10*3/uL (ref 0.0–0.1)
Basophils Relative: 0 %
Eosinophils Absolute: 0 10*3/uL (ref 0.0–0.5)
Eosinophils Relative: 1 %
HCT: 27.4 % — ABNORMAL LOW (ref 36.0–46.0)
Hemoglobin: 8.7 g/dL — ABNORMAL LOW (ref 12.0–15.0)
Immature Granulocytes: 1 %
Lymphocytes Relative: 4 %
Lymphs Abs: 0.4 10*3/uL — ABNORMAL LOW (ref 0.7–4.0)
MCH: 29.7 pg (ref 26.0–34.0)
MCHC: 31.8 g/dL (ref 30.0–36.0)
MCV: 93.5 fL (ref 80.0–100.0)
Monocytes Absolute: 0.9 10*3/uL (ref 0.1–1.0)
Monocytes Relative: 11 %
Neutro Abs: 6.8 10*3/uL (ref 1.7–7.7)
Neutrophils Relative %: 83 %
Platelets: 92 10*3/uL — ABNORMAL LOW (ref 150–400)
RBC: 2.93 MIL/uL — ABNORMAL LOW (ref 3.87–5.11)
RDW: 16.5 % — ABNORMAL HIGH (ref 11.5–15.5)
WBC: 8.1 10*3/uL (ref 4.0–10.5)
nRBC: 0 % (ref 0.0–0.2)

## 2021-05-11 LAB — GLUCOSE, CAPILLARY
Glucose-Capillary: 121 mg/dL — ABNORMAL HIGH (ref 70–99)
Glucose-Capillary: 167 mg/dL — ABNORMAL HIGH (ref 70–99)
Glucose-Capillary: 94 mg/dL (ref 70–99)

## 2021-05-11 LAB — TYPE AND SCREEN
ABO/RH(D): O POS
Antibody Screen: NEGATIVE
Unit division: 0

## 2021-05-11 LAB — RENAL FUNCTION PANEL
Albumin: 2.6 g/dL — ABNORMAL LOW (ref 3.5–5.0)
Anion gap: 10 (ref 5–15)
BUN: 66 mg/dL — ABNORMAL HIGH (ref 8–23)
CO2: 20 mmol/L — ABNORMAL LOW (ref 22–32)
Calcium: 8.4 mg/dL — ABNORMAL LOW (ref 8.9–10.3)
Chloride: 107 mmol/L (ref 98–111)
Creatinine, Ser: 4.37 mg/dL — ABNORMAL HIGH (ref 0.44–1.00)
GFR, Estimated: 10 mL/min — ABNORMAL LOW (ref >60)
Glucose, Bld: 162 mg/dL — ABNORMAL HIGH (ref 70–99)
Phosphorus: 4.3 mg/dL (ref 2.5–4.6)
Potassium: 4.6 mmol/L (ref 3.5–5.1)
Sodium: 137 mmol/L (ref 135–145)

## 2021-05-11 LAB — BPAM RBC
Blood Product Expiration Date: 202212282359
ISSUE DATE / TIME: 202211301119
Unit Type and Rh: 5100

## 2021-05-11 MED ORDER — DARBEPOETIN ALFA 40 MCG/0.4ML IJ SOSY
40.0000 ug | PREFILLED_SYRINGE | Freq: Once | INTRAMUSCULAR | Status: DC
  Filled 2021-05-11: qty 0.4

## 2021-05-11 NOTE — Progress Notes (Signed)
[name]  Assessment/ Plan: Pt is a 75 y.o. yo female  with history of type 2 diabetes, hypertension, CAD status post CABG, A. fib on Eliquis, grade 2 diastolic dysfunction, CKD stage IV with baseline serum creatinine level seems to be between 2.2-2.8 who was presented after fall at home with right hip intertrochanteric fracture, seen as a consultation for the evaluation of AKI on CKD.  # Acute kidney injury on CKD stage IV likely hemodynamically mediated in the setting of hypotension and anemia after the surgery.  UA with many bacteria and chronic proteinuria.  Treated with IV fluid and albumin.  The serum creatinine level is now trending down.  Patient is nonoliguric.  Since she has good oral intake I will discontinue IV fluid.  Repeat renal panel in the morning.  If creatinine level continue to improve then she can follow-up with Dr. Posey Pronto as outpatient.  Discussed with the patient and her husband.    #Hyperkalemia: Potassium level improved with medical management.   #Anemia due to acute blood loss from the surgery.  She may also have underlying anemia due to CKD.  Received blood transfusion.  Hemoglobin is stable today.  #Close right intertrochanteric hip fracture status post ORIF on 11/27.  Pain management, DVT prophylaxis, OT PT evaluation.  Sign off, please call back with question.  Discussed with the primary team.   Subjective: Seen and examined at bedside.  Urine output is recorded around 1.3 L.  Patient continues to feel good without any complaint or concern.  Denies nausea, vomiting, chest pain, shortness of breath.  Discussed with the nurse to discontinue IV fluid. Objective Vital signs in last 24 hours: Vitals:   05/10/21 1426 05/10/21 2000 05/11/21 0413 05/11/21 0736  BP: (!) 145/44 (!) 168/46 (!) 166/48 (!) 161/49  Pulse: 79 82 79 77  Resp: 20 19 20 20   Temp: 98.9 F (37.2 C) 98.4 F (36.9 C) 98.5 F (36.9 C) 99.4 F (37.4 C)   TempSrc: Oral Oral Oral Oral  SpO2: 96% 94% 94% 93%  Weight:      Height:       Weight change:   Intake/Output Summary (Last 24 hours) at 05/11/2021 1047 Last data filed at 05/11/2021 1020 Gross per 24 hour  Intake 4595.67 ml  Output 950 ml  Net 3645.67 ml        Labs: Basic Metabolic Panel: Recent Labs  Lab 05/09/21 0142 05/10/21 0252 05/11/21 0212  NA 136 134* 137  K 4.8 4.8 4.6  CL 105 107 107  CO2 22 19* 20*  GLUCOSE 132* 186* 162*  BUN 57* 64* 66*  CREATININE 4.24* 4.51* 4.37*  CALCIUM 8.4* 8.2* 8.4*  PHOS  --  5.0* 4.3    Liver Function Tests: Recent Labs  Lab 05/07/21 0529 05/10/21 0252 05/11/21 0212  AST 67*  --   --   ALT 45*  --   --   ALKPHOS 58  --   --   BILITOT 0.8  --   --   PROT 5.3*  --   --   ALBUMIN 2.5* 2.7* 2.6*    No results for input(s): LIPASE, AMYLASE in the last 168 hours. No results for input(s): AMMONIA in the last 168 hours. CBC: Recent Labs  Lab 05/07/21 0529 05/08/21 0635 05/09/21 0142 05/10/21 0252 05/10/21 1611 05/11/21 0212  WBC 9.3 13.5* 12.4* 6.8  --  8.1  NEUTROABS  --  11.5* 10.0* 5.4  --  6.8  HGB 8.5* 7.0* 8.3* 7.0* 8.1* 8.7*  HCT 30.0* 23.8* 27.2* 22.8* 25.9* 27.4*  MCV 98.7 98.3 93.5 95.4  --  93.5  PLT 128* 127* 93* 68*  --  92*    Cardiac Enzymes: No results for input(s): CKTOTAL, CKMB, CKMBINDEX, TROPONINI in the last 168 hours. CBG: Recent Labs  Lab 05/10/21 1226 05/10/21 1815 05/10/21 2008 05/10/21 2358 05/11/21 0611  GLUCAP 139* 99 184* 177* 121*     Iron Studies: No results for input(s): IRON, TIBC, TRANSFERRIN, FERRITIN in the last 72 hours. Studies/Results: No results found.  Medications: Infusions:  promethazine (PHENERGAN) injection (IM or IVPB) 12.5 mg (05/07/21 1555)    Scheduled Medications:  amiodarone  200 mg Oral Daily   apixaban  2.5 mg Oral BID   DULoxetine  20 mg Oral Daily   ezetimibe  10 mg Oral Daily   famotidine  20 mg Oral Daily   insulin aspart  0-9  Units Subcutaneous Q6H   insulin aspart  10 Units Subcutaneous TID WC   insulin glargine-yfgn  24 Units Subcutaneous QHS   metoprolol tartrate  12.5 mg Oral Daily   mometasone-formoterol  2 puff Inhalation BID   rosuvastatin  40 mg Oral QHS    have reviewed scheduled and prn medications.  Physical Exam: General: Not in distress, comfortable Heart:RRR, s1s2 nl Lungs: Clear b/l, no crackle Abdomen:soft, Non-tender, non-distended Extremities:No edema Neurology: Alert, awake and following commands.  Hannah [name] Hannah [name] 05/11/2021,10:47 AM  LOS: 5 days

## 2021-05-11 NOTE — TOC Progression Note (Signed)
Transition of Care [facility]) - Progression Note    Patient Details  Name: Hannah [name] MRN: [id number] Date of Birth: [birth year]  Transition of [facility]) CM/SW Contact  Joanne Chars, LCSW Phone Number: 05/11/2021, 11:04 AM  Clinical Narrative:   SNF Auth request submitted to [facility].    Expected Discharge Plan: [place] Barriers to Discharge: Continued Medical Work up, SNF Pending bed offer  Expected Discharge Plan and Services Expected Discharge Plan: [place] Choice: [place] arrangements for the past 2 months: Single Family Home                                       Social Determinants of Health (SDOH) Interventions    Readmission Risk Interventions No flowsheet data found.

## 2021-05-11 NOTE — Progress Notes (Incomplete)
Patient Care Team: [name], DO as PCP - General [name], NP (Inactive) as PCP - Cardiology (Nurse Practitioner) Evans Lance, MD as PCP - Electrophysiology (Cardiology) Larey Dresser, MD as PCP - Advanced Heart Failure (Cardiology) Alphonsa Overall, MD as Consulting Physician (General Surgery) Nicholas Lose, MD as Consulting Physician (Hematology and Oncology) Gery Pray, MD as Consulting Physician (Radiation Oncology) Evans Lance, MD as Consulting Physician (Cardiology) Collene Gobble, MD as Consulting Physician (Pulmonary Disease) Elayne Snare, MD as Consulting Physician (Endocrinology) Delice Bison, Charlestine Massed, NP as Nurse Practitioner (Hematology and Oncology) Ardelle Balls., MD (Neurology) Izora Gala, MD as Consulting Physician (Otolaryngology) Trula Slade, DPM as Consulting Physician (Podiatry) Cameron Sprang, MD as Consulting Physician (Neurology)  DIAGNOSIS: No diagnosis found.  SUMMARY OF ONCOLOGIC HISTORY: Oncology History  Breast cancer of upper-inner quadrant of right female breast ([place])  12/14/2015 Initial Diagnosis   Right breast biopsy 12:30 position: 2 masses, 1.6 cm mass: Invasive ductal carcinoma, grade 2, ER 0%, PR 0%, HER-2 negative, Ki-67 70%; satellite mass 8 mm: IDC grade 2 ER 5%, PR 5%, HER-2 negative, Ki-67 50%; T1cN0 stage IA clinical stage   01/18/2016 Surgery   Right lumpectomy: Multifocal IDC grade 3, 1.9 cm  ER 0%, PR 0%, HER-2 negative, Ki-67 70% and 0.8 cm satellite mass ER 5%, PR 2%, HER-2 negative, Ki-67 50%, high-grade DCIS, margins negative, 0/1 lymph nodes negative T1 cN0 stage IA   02/17/2016 - 04/06/2016 Chemotherapy   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    04/08/2016 - 04/23/2016 Hospital Admission   Hosp adm for cellulitis   05/24/2016 - 07/25/2016 Radiation Therapy    Adj XRT   06/29/2016 - 07/04/2016 Hospital Admission   Seizure like activity, MRI brain  and EEG unremarkable    08/16/2016 -  Anti-estrogen oral therapy   Letrozole could not tolerate it due to dizziness and lightheadedness, switched to anastrozole 04/04/2017 switched to exemestane 05/22/2017     CHIEF COMPLIANT: Follow-up of breast cancer and severe anemia of CKD  INTERVAL HISTORY: Hannah [name] is a 75 y.o. with above-mentioned history of cancer and severe anemia. She presents to the clinic today for follow-up.   ALLERGIES:  is allergic to amoxicillin, tape, aldactone [spironolactone], isosorbide nitrate, k-flex [orphenadrine], arimidex [anastrozole], latex, and tetracycline.  MEDICATIONS:  No current facility-administered medications for this visit.   No current outpatient medications on file.   Facility-Administered Medications Ordered in Other Visits  Medication Dose Route Frequency Provider Last Rate Last Admin   acetaminophen (TYLENOL) tablet 650 mg  650 mg Oral Q6H PRN Howerter, Justin B, DO       Or   acetaminophen (TYLENOL) suppository 650 mg  650 mg Rectal Q6H PRN Howerter, Justin B, DO       albuterol (PROVENTIL) (2.5 MG/3ML) 0.083% nebulizer solution 2.5 mg  2.5 mg Nebulization Q6H PRN Howerter, Justin B, DO       amiodarone (PACERONE) tablet 200 mg  200 mg Oral Daily Barb Merino, MD   200 mg at 05/11/21 8832   apixaban (ELIQUIS) tablet 2.5 mg  2.5 mg Oral BID Barb Merino, MD   2.5 mg at 05/11/21 5498   Darbepoetin Alfa (ARANESP) injection 40 mcg  40 mcg Subcutaneous Once Barb Merino, MD       DULoxetine (CYMBALTA) DR capsule 20 mg  20 mg Oral Daily Barb Merino, MD   20 mg at 05/11/21 2641   ezetimibe (  ZETIA) tablet 10 mg  10 mg Oral Daily Barb Merino, MD   10 mg at 05/11/21 0812   famotidine (PEPCID) tablet 20 mg  20 mg Oral Daily Barb Merino, MD   20 mg at 05/11/21 6962   HYDROmorphone (DILAUDID) injection 0.5 mg  0.5 mg Intravenous Q4H PRN Barb Merino, MD       insulin aspart (novoLOG) injection 0-9 Units  0-9 Units Subcutaneous Q6H  Howerter, Justin B, DO   2 Units at 05/11/21 1157   insulin aspart (novoLOG) injection 10 Units  10 Units Subcutaneous TID WC Barb Merino, MD   10 Units at 05/11/21 1657   insulin glargine-yfgn (SEMGLEE) injection 24 Units  24 Units Subcutaneous QHS Barb Merino, MD   24 Units at 05/10/21 2010   lip balm (CARMEX) ointment   Topical PRN Barb Merino, MD   75 application at 95/28/41 0859   melatonin tablet 3 mg  3 mg Oral QHS PRN Howerter, Justin B, DO       metoprolol tartrate (LOPRESSOR) tablet 12.5 mg  12.5 mg Oral Daily Barb Merino, MD   12.5 mg at 05/11/21 3244   mometasone-formoterol (DULERA) 200-5 MCG/ACT inhaler 2 puff  2 puff Inhalation BID Howerter, Justin B, DO   2 puff at 05/11/21 0759   naloxone (NARCAN) injection 0.4 mg  0.4 mg Intravenous PRN Howerter, Justin B, DO       oxyCODONE (Oxy IR/ROXICODONE) immediate release tablet 5 mg  5 mg Oral Q6H PRN Barb Merino, MD       promethazine (PHENERGAN) 12.5 mg in sodium chloride 0.9 % 50 mL IVPB  12.5 mg Intravenous Q6H PRN Barb Merino, MD 200 mL/hr at 05/07/21 1555 12.5 mg at 05/07/21 1555   rosuvastatin (CRESTOR) tablet 40 mg  40 mg Oral QHS Howerter, Justin B, DO   40 mg at 05/10/21 2010    PHYSICAL EXAMINATION: ECOG PERFORMANCE STATUS: {CHL ONC ECOG [phone]}  There were no vitals filed for this visit. There were no vitals filed for this visit.  BREAST:*** No palpable masses or nodules in either right or left breasts. No palpable axillary supraclavicular or infraclavicular adenopathy no breast tenderness or nipple discharge. (exam performed in the presence of a chaperone)  LABORATORY DATA:  I have reviewed the data as listed CMP Latest Ref Rng & Units 05/11/2021 05/10/2021 05/09/2021  Glucose 70 - 99 mg/dL 162(H) 186(H) 132(H)  BUN 8 - 23 mg/dL 66(H) 64(H) 57(H)  Creatinine 0.44 - 1.00 mg/dL 4.37(H) 4.51(H) 4.24(H)  Sodium 135 - 145 mmol/L 137 134(L) 136  Potassium 3.5 - 5.1 mmol/L 4.6 4.8 4.8  Chloride 98 -  111 mmol/L 107 107 105  CO2 22 - 32 mmol/L 20(L) 19(L) 22  Calcium 8.9 - 10.3 mg/dL 8.4(L) 8.2(L) 8.4(L)  Total Protein 6.5 - 8.1 g/dL - - -  Total Bilirubin 0.3 - 1.2 mg/dL - - -  Alkaline Phos 38 - 126 U/L - - -  AST 15 - 41 U/L - - -  ALT 0 - 44 U/L - - -    Lab Results  Component Value Date   WBC 8.1 05/11/2021   HGB 8.7 (L) 05/11/2021   HCT 27.4 (L) 05/11/2021   MCV 93.5 05/11/2021   PLT 92 (L) 05/11/2021   NEUTROABS 6.8 05/11/2021    ASSESSMENT & PLAN:  No problem-specific Assessment & Plan notes found for this encounter.    No orders of the defined types were placed in this encounter.  The patient  has a good understanding of the overall plan. she agrees with it. she will call with any problems that may develop before the next visit here.  Total time spent: *** mins including face to face time and time spent for planning, charting and coordination of care  Rulon Eisenmenger, MD, MPH 05/11/2021  I, Thana Ates, am acting as scribe for Dr. Nicholas Lose.  {insert scribe attestation}

## 2021-05-11 NOTE — Progress Notes (Signed)
Physical Therapy Treatment Patient Details Name: Hannah [name] MRN: [id number] DOB: 03-19-1946 Today's Date: 05/11/2021   History of Present Illness Pt is a 75y/o female admitted 11/26 with R intertrochanteric hip fracture after fall from standing.  S/P ORIF IM nail 11/27, WBAT. CTH negative for acute findings on 12/1. PMH includes: CHF, afib, DM, asthma, stage IV CKD, breast CA, glaucoma.    PT Comments    Pt and family agreeable to focus of PT/OT session on transfer training. Pt tolerated repeated transfers throughout session, requires less physical assist in stedy vs RW with less anxiety using stedy as well. Overall, pt requiring mod +2 assist for OOB mobility which is a dramatic improvement vs last PT session. PT to continue to follow to progress mobility as tolerated, SNF remains appropriate d/c plan.      Recommendations for follow up therapy are one component of a multi-disciplinary discharge planning process, led by the attending physician.  Recommendations may be updated based on patient status, additional functional criteria and insurance authorization.  Follow Up Recommendations  Skilled nursing-short term rehab (<3 hours/day)     Assistance Recommended at Discharge Frequent or constant Supervision/Assistance  Equipment Recommendations  Wheelchair (measurements PT);Wheelchair cushion (measurements PT)    Recommendations for Other Services       Precautions / Restrictions Precautions Precautions: Fall Restrictions Weight Bearing Restrictions: No RLE Weight Bearing: Weight bearing as tolerated     Mobility  Bed Mobility Overal bed mobility: Needs Assistance Bed Mobility: Supine to Sit     Supine to sit: Mod assist;+2 for physical assistance;HOB elevated     General bed mobility comments: verbal cuing for leg/hand placement on bedrail to assist with pulling self up into sitting; assist for trunk and LE management, scooting to EOB with use of bed pads and weight  shifting.    Transfers Overall transfer level: Needs assistance Equipment used: Rolling walker (2 wheels);2 person hand held assist;Ambulation equipment used Transfers: Sit to/from Stand;Bed to chair/wheelchair/BSC Sit to Stand: +2 physical assistance;+2 safety/equipment;Mod assist Stand pivot transfers: Max assist;+2 physical assistance Squat pivot transfers: Max assist;+2 physical assistance       General transfer comment: stood x2 with RW, x3 with STEDY, max A+2 for step pivot with RW for steadying and assisting with bodyweight support as RLE not accepting functional weight during transfers, min-mod A +2 for sit to stand transfer from [employer] Transfer via Lift Equipment: Stedy  Ambulation/Gait               General Gait Details: unable   Chief Strategy Officer    Modified Rankin (Stroke Patients Only)       Balance Overall balance assessment: Needs assistance Sitting-balance support: Feet supported;Bilateral upper extremity supported Sitting balance-Leahy Scale: Poor Sitting balance - Comments: posterior lean preference, reliant on UEs to maintain upright sitting Postural control: Posterior lean Standing balance support: Bilateral upper extremity supported Standing balance-Leahy Scale: Zero Standing balance comment: max +2 to stand from bed surface                            Cognition Arousal/Alertness: Awake/alert Behavior During Therapy: Anxious;WFL for tasks assessed/performed Overall Cognitive Status: Impaired/Different from baseline Area of Impairment: Following commands;Problem solving;Safety/judgement                       Following Commands: Follows one step commands with  increased time Safety/Judgement: Decreased awareness of safety   Problem Solving: Slow processing;Difficulty sequencing;Requires verbal cues;Decreased initiation;Requires tactile cues General Comments: pt anxious, shaking during session,  fearful of falling        Exercises General Exercises - Lower Extremity Ankle Circles/Pumps: AROM;Both;10 reps;Seated Quad Sets: AAROM;Right;10 reps;Seated Long Arc Quad: AAROM;Right;15 reps;Seated    General Comments General comments (skin integrity, edema, etc.): vss      Pertinent Vitals/Pain Pain Assessment: Faces Pain Score: 3  Faces Pain Scale: Hurts little more Pain Location: R LE with movement Pain Descriptors / Indicators: Discomfort;Grimacing;Operative site guarding Pain Intervention(s): Limited activity within patient's tolerance;Monitored during session;Repositioned    Home Living                          Prior Function            PT Goals (current goals can now be found in the care plan section) Acute Rehab PT Goals Patient Stated Goal: to go to her granddaughter's wedding PT Goal Formulation: With patient Time For Goal Achievement: 05/22/21 Potential to Achieve Goals: Good Progress towards PT goals: Progressing toward goals    Frequency    Min 3X/week      PT Plan Current plan remains appropriate    Co-evaluation PT/OT/SLP Co-Evaluation/Treatment: Yes Reason for Co-Treatment: For patient/therapist safety;To address functional/ADL transfers PT goals addressed during session: Mobility/safety with mobility;Balance;Strengthening/ROM OT goals addressed during session: ADL's and self-care      AM-PAC PT "6 Clicks" Mobility   Outcome Measure  Help needed turning from your back to your side while in a flat bed without using bedrails?: A Lot Help needed moving from lying on your back to sitting on the side of a flat bed without using bedrails?: A Lot Help needed moving to and from a bed to a chair (including a wheelchair)?: Total Help needed standing up from a chair using your arms (e.g., wheelchair or bedside chair)?: Total Help needed to walk in hospital room?: Total Help needed climbing 3-5 steps with a railing? : Total 6 Click Score:  8    End of Session Equipment Utilized During Treatment: Gait belt Activity Tolerance: Patient limited by pain;Patient limited by fatigue;Other (comment) (fear of falling) Patient left: in chair;with call bell/phone within reach;with chair alarm set;with family/visitor present Nurse Communication: Mobility status;Need for lift equipment (stedy for back to bed) PT Visit Diagnosis: Unsteadiness on feet (R26.81);Muscle weakness (generalized) (M62.81);History of falling (Z91.81);Difficulty in walking, not elsewhere classified (R26.2);Pain Pain - Right/Left: Right Pain - part of body: Leg     Time: 1425-1453 PT Time Calculation (min) (ACUTE ONLY): 28 min  Charges:  $Therapeutic Activity: 8-22 mins                     Stacie Glaze, PT DPT Acute Rehabilitation Services Pager [phone]  Office [phone]    Roxine Caddy E Ruffin Pyo 05/11/2021, 3:31 PM

## 2021-05-11 NOTE — Progress Notes (Signed)
Occupational Therapy Treatment Patient Details Name: Hannah [name] MRN: [id number] DOB: 1946/03/31 Today's Date: 05/11/2021   History of present illness Pt is a 75y/o female admitted 11/26 with R intertrochanteric hip fracture after fall from standing.  S/P ORIF IM nail 11/27, WBAT.  PMH includes: CHF, afib, DM, asthma, stage IV CKD, breast CA, glaucoma.   OT comments  Pt min-max A +2 for ADLs during session, mod-max A +2 for bed mobility, max A +2 for transfers. Pt has poor sitting balance, strong posterior lean, able to complete UB dressing with single arm support to maintain upright posture. Pt able to complete sit to stand transfer x5 using RW with 2 person assist and STEDY lift, mod cues needed for body positioning and hand placement on RW. Pt able to stand min-mod A +2 from STEDY. Pt limited by decreased balance, activity tolerance, pain, strength, and ROM at this time. Will continue to follow acutely. Recommend SNF at d/c.   Recommendations for follow up therapy are one component of a multi-disciplinary discharge planning process, led by the attending physician.  Recommendations may be updated based on patient status, additional functional criteria and insurance authorization.    Follow Up Recommendations  Skilled nursing-short term rehab (<3 hours/day)    Assistance Recommended at Discharge Frequent or constant Supervision/Assistance  Equipment Recommendations  Other (comment) (TBD at next venue of care)    Recommendations for Other Services PT consult    Precautions / Restrictions Precautions Precautions: Fall Restrictions Weight Bearing Restrictions: Yes RLE Weight Bearing: Weight bearing as tolerated       Mobility Bed Mobility Overal bed mobility: Needs Assistance Bed Mobility: Supine to Sit     Supine to sit: Mod assist;Max assist;+2 for physical assistance;HOB elevated     General bed mobility comments: verbal cuing for leg/hand placement on bedrail to assist  with pulling self up into sitting    Transfers Overall transfer level: Needs assistance Equipment used: Rolling walker (2 wheels);2 person hand held assist Transfers: Sit to/from Stand;Bed to chair/wheelchair/BSC Sit to Stand: Max assist;+2 physical assistance;+2 safety/equipment;Min assist;Mod assist     Squat pivot transfers: Max assist;+2 physical assistance     General transfer comment: stood x2 with RW, x3 with STEDY, max A+2 for sit to stand transfer with RW, min-mod A +2 for sit to stand transfer from [employer] Transfer via Lift Equipment: Stedy   Balance Overall balance assessment: Needs assistance Sitting-balance support: Feet supported;Bilateral upper extremity supported Sitting balance-Leahy Scale: Poor Sitting balance - Comments: UE gripping mattress/bedrail to remain upright posture in sitting Postural control: Posterior lean Standing balance support: Bilateral upper extremity supported Standing balance-Leahy Scale: Poor                             ADL either performed or assessed with clinical judgement   ADL Overall ADL's : Needs assistance/impaired                 Upper Body Dressing : Minimal assistance;Sitting Upper Body Dressing Details (indicate cue type and reason): required single arm support on chair armrest to avoid posterior lean while sitting     Toilet Transfer: Maximal assistance;+2 for physical assistance;BSC/3in1;Stand-pivot Toilet Transfer Details (indicate cue type and reason): simulated to chair         Functional mobility during ADLs: Maximal assistance;+2 for physical assistance;+2 for safety/equipment;Rolling walker (2 wheels) General ADL Comments: pt limited by weakness, posterior trunk lean    Extremity/Trunk  Assessment Upper Extremity Assessment Upper Extremity Assessment: Generalized weakness   Lower Extremity Assessment Lower Extremity Assessment: Defer to PT evaluation        Vision   Vision Assessment?: No  apparent visual deficits   Perception Perception Perception: Not tested   Praxis Praxis Praxis: Not tested    Cognition Arousal/Alertness: Awake/alert Behavior During Therapy: Anxious;WFL for tasks assessed/performed                                   General Comments: pt anxious, shaking during session, fearful of falling          Exercises     Shoulder Instructions       General Comments VSS, spouse/son present throughout session    Pertinent Vitals/ Pain       Pain Assessment: Faces Pain Score: 3  Faces Pain Scale: Hurts little more Pain Location: R LE with movement Pain Descriptors / Indicators: Discomfort;Grimacing;Operative site guarding Pain Intervention(s): Limited activity within patient's tolerance;Monitored during session;Repositioned;Relaxation  Home Living                                          Prior Functioning/Environment              Frequency  Min 2X/week        Progress Toward Goals  OT Goals(current goals can now be found in the care plan section)  Progress towards OT goals: Progressing toward goals  Acute Rehab OT Goals Patient Stated Goal: none stated OT Goal Formulation: With patient/family Time For Goal Achievement: 05/22/21 Potential to Achieve Goals: Good ADL Goals Pt Will Perform Grooming: with set-up;sitting Pt Will Perform Lower Body Dressing: with mod assist;with adaptive equipment;sit to/from stand;sitting/lateral leans Pt Will Transfer to Toilet: with mod assist;stand pivot transfer;bedside commode Additional ADL Goal #1: Pt will maintain dynamic sitting balance at EOB for 10 minutes with no more than min assist as precursor to ADLs.  Plan Discharge plan remains appropriate;Frequency remains appropriate    Co-evaluation    PT/OT/SLP Co-Evaluation/Treatment: Yes Reason for Co-Treatment: To address functional/ADL transfers;For patient/therapist safety   OT goals addressed during  session: ADL's and self-care      AM-PAC OT "6 Clicks" Daily Activity     Outcome Measure   Help from another person eating meals?: None Help from another person taking care of personal grooming?: A Little Help from another person toileting, which includes using toliet, bedpan, or urinal?: Total Help from another person bathing (including washing, rinsing, drying)?: Total Help from another person to put on and taking off regular upper body clothing?: A Lot Help from another person to put on and taking off regular lower body clothing?: Total 6 Click Score: 12    End of Session Equipment Utilized During Treatment: Rolling walker (2 wheels);Gait belt  OT Visit Diagnosis: Other abnormalities of gait and mobility (R26.89);Muscle weakness (generalized) (M62.81);History of falling (Z91.81)   Activity Tolerance Patient tolerated treatment well   Patient Left in chair;with chair alarm set;with family/visitor present;with call bell/phone within reach   Nurse Communication Mobility status;Need for lift equipment (RN notified of needed STEDY to return to bed)        Time: 0102-7253 OT Time Calculation (min): 28 min  Charges: OT General Charges $OT Visit: 1 Visit OT Treatments $Self Care/Home Management : 8-22 mins  Lynnda Child, OTD, OTR/L Acute Rehab [phone]  Kaylyn Lim 05/11/2021, 3:07 PM

## 2021-05-11 NOTE — Progress Notes (Signed)
PROGRESS NOTE    Hannah [name]  OXB:[id number] DOB: [birth year] DOA: 05/06/2021 PCP: [name], DO    Brief Narrative:  75 year old with history of chronic diastolic heart failure and persistent A. fib on Eliquis, type 2 diabetes on insulin, moderate persistent asthma, stage IV chronic kidney disease with baseline creatinine about 2.2-2.5 presented with fall at home, right hip pain and found to have right intertrochanteric hip fracture. Hospital course complicated by worsening renal functions and anemia.   Assessment & Plan:   Principal Problem:   Closed right hip fracture (HCC) Active Problems:   Allergic rhinitis   Asthma   Hyperlipidemia   Type 2 diabetes mellitus (HCC)   CKD (chronic kidney disease), stage IV (HCC)   Chronic diastolic CHF (congestive heart failure) (HCC)   GERD (gastroesophageal reflux disease)   PAF (paroxysmal atrial fibrillation) (HCC)   Right hip pain  Close traumatic right intertrochanteric hip fracture Status post ORIF, IM nailing Dr. Sammuel Hines 11/27.   Adequate pain medications with oral opiates.  Using oral pain medications and mobilizing with PT OT. Weightbearing as tolerated. PT/OT DVT prophylaxis, Eliquis.  Renally dosed. Discharge to skilled nursing facility pending renal function improvement and stabilization.  Anticipate tomorrow.  Diabetes type 2 uncontrolled with hyperglycemia: On insulin regimen at home. Blood sugars are better today on home medications.  Continue.  Acute kidney injury with CKD stage IV/hyperkalemia: Kidney function continued to deteriorate probably due to intraoperative hemodynamic disturbances.  Seen by nephrology. Treated with IV fluids.  Albumin infusion.  Urine output 1.3 L / 24 hours.  Clinically and biochemically normalizing.  Recheck tomorrow morning.  Anemia of acute blood loss in the patient with anemia of chronic disease: Baseline hemoglobin 9-10.  Expected from blood loss from long bone fracture and  perioperative blood loss. Total 2 units of PRBC transfusion.  He stabilized. She takes hematocrit stimulating injection, will give her 40 mcg of Aranesp today.  Chronic diastolic heart failure: Well compensated and euvolemic.   Paroxysmal A. fib: Rate controlled on metoprolol.  Continued metoprolol and amiodarone.  She is sinus rhythm.  Therapeutic on Eliquis.   Confusion: Episodic sleepiness.  Likely due to combination of medical issues, low-grade uremia.  Neurologically stable.  Patient is on Eliquis.  Will check CT scan of the head today.   DVT prophylaxis: apixaban (ELIQUIS) tablet 2.5 mg Start: 05/08/21 1000 SCDs Start: 05/06/21 2010 apixaban (ELIQUIS) tablet 2.5 mg   Code Status: Full code Family Communication: Husband at the bedside. Disposition Plan: Status is: Inpatient  Remains inpatient appropriate because: Pain management.  Worsening kidney functions.  Needs to be stabilized before discharge.  Consultants:  Orthopedics Nephrology  Procedures:  Right hip IM nail  Antimicrobials:  None   Subjective:  Patient seen and examined.  No overnight events.  Husband at the bedside.  Pain is controlled.  Patient is still with difficulties with fine motor skills including finding Apps  on the cell phone and make a call.  Patient herself denies any complaints. Not used any sleeping aid or pain medication since last night.  Objective: Vitals:   05/10/21 2000 05/11/21 0413 05/11/21 0736 05/11/21 1126  BP: (!) 168/46 (!) 166/48 (!) 161/49 (!) 164/45  Pulse: 82 79 77 77  Resp: 19 20 20 20   Temp: 98.4 F (36.9 C) 98.5 F (36.9 C) 99.4 F (37.4 C) 98.7 F (37.1 C)  TempSrc: Oral Oral Oral Oral  SpO2: 94% 94% 93% 95%  Weight:      Height:  Intake/Output Summary (Last 24 hours) at 05/11/2021 1257 Last data filed at 05/11/2021 1127 Gross per 24 hour  Intake 4835.67 ml  Output 1350 ml  Net 3485.67 ml    Filed Weights   05/06/21 1832  Weight: 63.5 kg     Examination:  General exam: Appears calm and comfortable at rest.   Respiratory system: Clear to auscultation. Respiratory effort normal. Cardiovascular system: S1 & S2 heard, RRR. No JVD, murmurs, rubs, gallops or clicks. No pedal edema. Gastrointestinal system: Abdomen is nondistended, soft and nontender. No organomegaly or masses felt. Normal bowel sounds heard. Central nervous system: Alert and oriented. No focal neurological deficits. Extremities: Symmetric 5 x 5 power. Right lateral thigh incisions clean and dry.  Minimal swelling as expected from surgery.  No drainage.  Distal neurovascular status intact. Pure wick with clear urine.    Data Reviewed: I have personally reviewed following labs and imaging studies  CBC: Recent Labs  Lab 05/06/21 1824 05/07/21 0529 05/08/21 0635 05/09/21 0142 05/10/21 0252 05/10/21 1611 05/11/21 0212  WBC 6.2 9.3 13.5* 12.4* 6.8  --  8.1  NEUTROABS 4.6  --  11.5* 10.0* 5.4  --  6.8  HGB 10.5* 8.5* 7.0* 8.3* 7.0* 8.1* 8.7*  HCT 35.2* 30.0* 23.8* 27.2* 22.8* 25.9* 27.4*  MCV 96.4 98.7 98.3 93.5 95.4  --  93.5  PLT 129* 128* 127* 93* 68*  --  92*   Basic Metabolic Panel: Recent Labs  Lab 05/06/21 2018 05/07/21 0529 05/08/21 0635 05/09/21 0142 05/10/21 0252 05/11/21 0212  NA  --  134* 135 136 134* 137  K  --  5.0 5.3* 4.8 4.8 4.6  CL  --  103 103 105 107 107  CO2  --  24 23 22  19* 20*  GLUCOSE  --  155* 202* 132* 186* 162*  BUN  --  27* 43* 57* 64* 66*  CREATININE  --  2.33* 3.49* 4.24* 4.51* 4.37*  CALCIUM  --  8.7* 8.5* 8.4* 8.2* 8.4*  MG 2.0 1.9 2.0  --   --   --   PHOS  --   --   --   --  5.0* 4.3   GFR: Estimated Creatinine Clearance: 9.6 mL/min (A) (by C-G formula based on SCr of 4.37 mg/dL (H)). Liver Function Tests: Recent Labs  Lab 05/07/21 0529 05/10/21 0252 05/11/21 0212  AST 67*  --   --   ALT 45*  --   --   ALKPHOS 58  --   --   BILITOT 0.8  --   --   PROT 5.3*  --   --   ALBUMIN 2.5* 2.7* 2.6*   No  results for input(s): LIPASE, AMYLASE in the last 168 hours. No results for input(s): AMMONIA in the last 168 hours. Coagulation Profile: Recent Labs  Lab 05/06/21 1824  INR 1.3*   Cardiac Enzymes: No results for input(s): CKTOTAL, CKMB, CKMBINDEX, TROPONINI in the last 168 hours. BNP (last 3 results) No results for input(s): PROBNP in the last 8760 hours. HbA1C: No results for input(s): HGBA1C in the last 72 hours. CBG: Recent Labs  Lab 05/10/21 1815 05/10/21 2008 05/10/21 2358 05/11/21 0611 05/11/21 1133  GLUCAP 99 184* 177* 121* 167*   Lipid Profile: No results for input(s): CHOL, HDL, LDLCALC, TRIG, CHOLHDL, LDLDIRECT in the last 72 hours. Thyroid Function Tests: No results for input(s): TSH, T4TOTAL, FREET4, T3FREE, THYROIDAB in the last 72 hours. Anemia Panel: No results for input(s): VITAMINB12, FOLATE, FERRITIN, TIBC,  IRON, RETICCTPCT in the last 72 hours. Sepsis Labs: No results for input(s): PROCALCITON, LATICACIDVEN in the last 168 hours.  Recent Results (from the past 240 hour(s))  Resp Panel by RT-PCR (Flu A&B, Covid) Nasopharyngeal Swab     Status: None   Collection Time: 05/06/21  8:18 PM   Specimen: Nasopharyngeal Swab; Nasopharyngeal(NP) swabs in vial transport medium  Result Value Ref Range Status   SARS Coronavirus 2 by RT PCR NEGATIVE NEGATIVE Final    Comment: (NOTE) SARS-CoV-2 target nucleic acids are NOT DETECTED.  The SARS-CoV-2 RNA is generally detectable in upper respiratory specimens during the acute phase of infection. The lowest concentration of SARS-CoV-2 viral copies this assay can detect is 138 copies/mL. A negative result does not preclude SARS-Cov-2 infection and should not be used as the sole basis for treatment or other patient management decisions. A negative result may occur with  improper specimen collection/handling, submission of specimen other than nasopharyngeal swab, presence of viral mutation(s) within the areas targeted  by this assay, and inadequate number of viral copies(<138 copies/mL). A negative result must be combined with clinical observations, patient history, and epidemiological information. The expected result is Negative.  Fact Sheet for Patients:  EntrepreneurPulse.com.au  Fact Sheet for Healthcare Providers:  IncredibleEmployment.be  This test is no t yet approved or cleared by the [place] FDA and  has been authorized for detection and/or diagnosis of SARS-CoV-2 by FDA under an Emergency Use Authorization (EUA). This EUA will remain  in effect (meaning this test can be used) for the duration of the COVID-19 declaration under Section 564(b)(1) of the Act, 21 U.S.C.section 360bbb-3(b)(1), unless the authorization is terminated  or revoked sooner.       Influenza A by PCR NEGATIVE NEGATIVE Final   Influenza B by PCR NEGATIVE NEGATIVE Final    Comment: (NOTE) The Xpert Xpress SARS-CoV-2/FLU/RSV plus assay is intended as an aid in the diagnosis of influenza from Nasopharyngeal swab specimens and should not be used as a sole basis for treatment. Nasal washings and aspirates are unacceptable for Xpert Xpress SARS-CoV-2/FLU/RSV testing.  Fact Sheet for Patients: EntrepreneurPulse.com.au  Fact Sheet for Healthcare Providers: IncredibleEmployment.be  This test is not yet approved or cleared by the [place] FDA and has been authorized for detection and/or diagnosis of SARS-CoV-2 by FDA under an Emergency Use Authorization (EUA). This EUA will remain in effect (meaning this test can be used) for the duration of the COVID-19 declaration under Section 564(b)(1) of the Act, 21 U.S.C. section 360bbb-3(b)(1), unless the authorization is terminated or revoked.  Performed at [facility], [place] [address], [place], [place] [address]   Surgical pcr screen     Status: None   Collection Time: 05/07/21  9:28 AM    Specimen: Nasal Mucosa; Nasal Swab  Result Value Ref Range Status   MRSA, PCR NEGATIVE NEGATIVE Final   Staphylococcus aureus NEGATIVE NEGATIVE Final    Comment: (NOTE) The Xpert SA Assay (FDA approved for NASAL specimens in patients 72 years of age and older), is one component of a comprehensive surveillance program. It is not intended to diagnose infection nor to guide or monitor treatment. Performed at [facility], [place] [address] [place], [place] [address]          Radiology Studies: CUP PACEART REMOTE DEVICE CHECK  Result Date: 05/11/2021 Scheduled remote reviewed. Normal device function.  Next remote 91 days. Kathy Breach, RN, CCDS, [employer]  Scheduled Meds:  amiodarone  200 mg Oral Daily   apixaban  2.5 mg Oral BID   darbepoetin (ARANESP) injection - NON-DIALYSIS  40 mcg Subcutaneous Once   DULoxetine  20 mg Oral Daily   ezetimibe  10 mg Oral Daily   famotidine  20 mg Oral Daily   insulin aspart  0-9 Units Subcutaneous Q6H   insulin aspart  10 Units Subcutaneous TID WC   insulin glargine-yfgn  24 Units Subcutaneous QHS   metoprolol tartrate  12.5 mg Oral Daily   mometasone-formoterol  2 puff Inhalation BID   rosuvastatin  40 mg Oral QHS   Continuous Infusions:  promethazine (PHENERGAN) injection (IM or IVPB) 12.5 mg (05/07/21 1555)     LOS: 5 days    Time spent: 30 minutes    Barb Merino, MD [facility] Pager [phone]

## 2021-05-12 ENCOUNTER — Other Ambulatory Visit: Payer: Medicare Other

## 2021-05-12 ENCOUNTER — Inpatient Hospital Stay: Payer: Medicare Other

## 2021-05-12 ENCOUNTER — Inpatient Hospital Stay: Payer: Medicare Other | Admitting: Hematology and Oncology

## 2021-05-12 LAB — CBC WITH DIFFERENTIAL/PLATELET
Abs Immature Granulocytes: 0.03 10*3/uL (ref 0.00–0.07)
Basophils Absolute: 0 10*3/uL (ref 0.0–0.1)
Basophils Relative: 0 %
Eosinophils Absolute: 0.1 10*3/uL (ref 0.0–0.5)
Eosinophils Relative: 1 %
HCT: 26.9 % — ABNORMAL LOW (ref 36.0–46.0)
Hemoglobin: 8.5 g/dL — ABNORMAL LOW (ref 12.0–15.0)
Immature Granulocytes: 0 %
Lymphocytes Relative: 4 %
Lymphs Abs: 0.3 10*3/uL — ABNORMAL LOW (ref 0.7–4.0)
MCH: 29.2 pg (ref 26.0–34.0)
MCHC: 31.6 g/dL (ref 30.0–36.0)
MCV: 92.4 fL (ref 80.0–100.0)
Monocytes Absolute: 1.1 10*3/uL — ABNORMAL HIGH (ref 0.1–1.0)
Monocytes Relative: 15 %
Neutro Abs: 5.5 10*3/uL (ref 1.7–7.7)
Neutrophils Relative %: 80 %
Platelets: 97 10*3/uL — ABNORMAL LOW (ref 150–400)
RBC: 2.91 MIL/uL — ABNORMAL LOW (ref 3.87–5.11)
RDW: 16.5 % — ABNORMAL HIGH (ref 11.5–15.5)
WBC: 7 10*3/uL (ref 4.0–10.5)
nRBC: 0 % (ref 0.0–0.2)

## 2021-05-12 LAB — RESP PANEL BY RT-PCR (FLU A&B, COVID) ARPGX2
Influenza A by PCR: NEGATIVE
Influenza B by PCR: NEGATIVE
SARS Coronavirus 2 by RT PCR: NEGATIVE

## 2021-05-12 LAB — RENAL FUNCTION PANEL
Albumin: 2.4 g/dL — ABNORMAL LOW (ref 3.5–5.0)
Anion gap: 8 (ref 5–15)
BUN: 63 mg/dL — ABNORMAL HIGH (ref 8–23)
CO2: 20 mmol/L — ABNORMAL LOW (ref 22–32)
Calcium: 8.4 mg/dL — ABNORMAL LOW (ref 8.9–10.3)
Chloride: 109 mmol/L (ref 98–111)
Creatinine, Ser: 4.33 mg/dL — ABNORMAL HIGH (ref 0.44–1.00)
GFR, Estimated: 10 mL/min — ABNORMAL LOW (ref >60)
Glucose, Bld: 101 mg/dL — ABNORMAL HIGH (ref 70–99)
Phosphorus: 3.7 mg/dL (ref 2.5–4.6)
Potassium: 4.2 mmol/L (ref 3.5–5.1)
Sodium: 137 mmol/L (ref 135–145)

## 2021-05-12 LAB — GLUCOSE, CAPILLARY
Glucose-Capillary: 109 mg/dL — ABNORMAL HIGH (ref 70–99)
Glucose-Capillary: 114 mg/dL — ABNORMAL HIGH (ref 70–99)
Glucose-Capillary: 93 mg/dL (ref 70–99)

## 2021-05-12 MED ORDER — ACETAMINOPHEN 325 MG PO TABS: 650.0000 mg | ORAL_TABLET | Freq: Four times a day (QID) | ORAL | Status: AC | PRN

## 2021-05-12 MED ORDER — OXYCODONE HCL 5 MG PO TABS: 5.0000 mg | ORAL_TABLET | Freq: Four times a day (QID) | ORAL | 0 refills | Status: AC | PRN

## 2021-05-12 NOTE — TOC Progression Note (Signed)
Transition of Care [facility]) - Progression Note    Patient Details  Name: Hannah [name] MRN: [id number] Date of Birth: 08-27-1945  Transition of Care [facility]) CM/SW Contact  Joanne Chars, LCSW Phone Number: 05/12/2021, 8:45 AM  Clinical Narrative:   Josem Kaufmann approved in [place]: ref# [id number], 5 days, 12/2-12/6    Expected Discharge Plan: [place] Barriers to Discharge: Continued Medical Work up, SNF Pending bed offer  Expected Discharge Plan and Services Expected Discharge Plan: [place] Choice: [place] arrangements for the past 2 months: Single Family Home                                       Social Determinants of Health (SDOH) Interventions    Readmission Risk Interventions No flowsheet data found.

## 2021-05-12 NOTE — Discharge Summary (Signed)
Physician Discharge Summary  Hannah [name] MRN:[id number] DOB: 07/07/1945 DOA: 05/06/2021  PCP: Skakle, [place], DO  Admit date: 05/06/2021 Discharge date: 05/12/2021  Admitted From: Home Disposition: Skilled nursing facility  Recommendations for Outpatient Follow-up:  Follow up with PCP in 1-2 weeks Please obtain BMP/CBC in one week Please follow-up with your nephrologist as a scheduled  Home Health: N/A Equipment/Devices: N/A  Discharge Condition: Fair CODE STATUS: Full code Diet recommendation: Low-salt and low-carb diet  Discharge summary: 75-year-old with history of chronic diastolic heart failure and persistent A. fib on Eliquis, type 2 diabetes on insulin, moderate persistent asthma, stage IV chronic kidney disease with baseline creatinine about 2.2-2.5 presented with fall at home, right hip pain and found to have right intertrochanteric hip fracture. Hospital course complicated by worsening renal functions and anemia.     Assessment & plan of care:   Close traumatic right intertrochanteric hip fracture Status post ORIF, IM nailing Dr. Bokshan 11/27.   Adequate pain medications with oral opiates.  Using oral pain medications and mobilizing with PT OT. Weightbearing as tolerated. PT/OT DVT prophylaxis, Eliquis.  Renally dosed. Orthopedics to schedule follow-up.   Diabetes type 2 uncontrolled with hyperglycemia: On insulin regimen at home. Blood sugars are better today on home medications.  Continue similar doses.  Avoid hypoglycemia.  Acute kidney injury with CKD stage IV/hyperkalemia: Kidney function continued to deteriorate probably due to intraoperative hemodynamic disturbances.  Seen by nephrology. Treated with IV fluids.  Albumin infusion.  Clinically and biochemically improving.  Adequate urine output. Avoid all nephrotoxins.  Avoid diuretics. Please recheck in 1 week to ensure stabilization.   Anemia of acute blood loss in the patient with anemia of chronic  disease: Baseline hemoglobin 9-10.  Expected from blood loss from long bone fracture and perioperative blood loss. Total 2 units of PRBC transfusion.  Hemoglobin is stabilized. She takes hematocrit stimulating injection, given 40 mcg of Aranesp in the hospital.  Chronic diastolic heart failure: Well compensated and euvolemic.  Holding Lasix for now.  Paroxysmal A. fib: Rate controlled on metoprolol.  Continued metoprolol and amiodarone.  She is sinus rhythm.  Therapeutic on Eliquis.    Multiple chronic medical issues.  Medically stabilized today.  Able to be transferred to a skilled nursing facility today to continue to work with PT OT. Monitor blood sugars. Monitor urine output and renal functions.   Discharge Diagnoses:  Principal Problem:   Closed right hip fracture (HCC) Active Problems:   Allergic rhinitis   Asthma   Hyperlipidemia   Type 2 diabetes mellitus (HCC)   CKD (chronic kidney disease), stage IV (HCC)   Chronic diastolic CHF (congestive heart failure) (HCC)   GERD (gastroesophageal reflux disease)   PAF (paroxysmal atrial fibrillation) (HCC)   Right hip pain    Discharge Instructions  Discharge Instructions     Call MD for:  redness, tenderness, or signs of infection (pain, swelling, redness, odor or green/yellow discharge around incision site)   Complete by: As directed    Call MD for:  severe uncontrolled pain   Complete by: As directed    Call MD for:  temperature >100.4   Complete by: As directed    Diet - low sodium heart healthy   Complete by: As directed    Diet Carb Modified   Complete by: As directed    Increase activity slowly   Complete by: As directed    Leave dressing on - Keep it clean, dry, and intact until clinic visit     Complete by: As directed       Allergies as of 05/12/2021       Reactions   Amoxicillin Rash, Other (See Comments)   Tolerates Cephalosporins Has patient had a PCN reaction causing immediate rash,  facial/tongue/throat swelling, SOB or lightheadedness with hypotension: Yes Has patient had a PCN reaction causing severe rash involving mucus membranes or skin necrosis: Yes Has patient had a PCN reaction that required hospitalization No Has patient had a PCN reaction occurring within the last 10 years: No If all of the above answers are "NO", then may proceed with Cephalosporin use.   Tape Other (See Comments)   Pulls off skin, must use paper tape   Aldactone [spironolactone] Other (See Comments)   CKD/hypokalemia   Isosorbide Nitrate    Other reaction(s): fainting   K-flex [orphenadrine]    Other reaction(s): stomach pain   Arimidex [anastrozole] Nausea Only   Latex Itching, Other (See Comments)   (Dentist office)   Tetracycline Rash        Medication List     STOP taking these medications    furosemide 20 MG tablet Commonly known as: LASIX   Lyumjev 100 UNIT/ML Soln Generic drug: Insulin Lispro-aabc   ondansetron 4 MG disintegrating tablet Commonly known as: Zofran ODT   zolpidem 5 MG tablet Commonly known as: AMBIEN       TAKE these medications    Accu-Chek FastClix Lancets Misc Use to check blood sugar 3 times per day   Accu-Chek Guide Me w/Device Kit Use accu chek meter to check blood sugar three times daily.   Accu-Chek Guide test strip Generic drug: glucose blood Use to test blood sugar 3 times daily   acetaminophen 325 MG tablet Commonly known as: TYLENOL Take 2 tablets (650 mg total) by mouth every 6 (six) hours as needed for mild pain or fever (or Fever >/= 101). What changed:  medication strength how much to take when to take this reasons to take this   albuterol (2.5 MG/3ML) 0.083% nebulizer solution Commonly known as: PROVENTIL Take 3 mLs (2.5 mg total) by nebulization every 6 (six) hours as needed for wheezing or shortness of breath.   albuterol 108 (90 Base) MCG/ACT inhaler Commonly known as: VENTOLIN HFA Inhale 2 puffs into the  lungs every 6 (six) hours as needed for wheezing or shortness of breath.   amiodarone 200 MG tablet Commonly known as: PACERONE TAKE ONE TABLET BY MOUTH DAILY   apixaban 2.5 MG Tabs tablet Commonly known as: Eliquis Take 1 tablet (2.5 mg total) by mouth 2 (two) times daily.   BD Pen Needle Nano U/F 32G X 4 MM Misc Generic drug: Insulin Pen Needle USE AS INSTRUCTED TO INJECT INSULIN 4 TIMES DAILY.   cetirizine 10 MG tablet Commonly known as: ZYRTEC Take 10 mg by mouth at bedtime.   Dialyvite Vitamin D 5000 125 MCG (5000 UT) capsule Generic drug: Cholecalciferol Take 5,000 Units by mouth daily.   DULoxetine 20 MG capsule Commonly known as: CYMBALTA Take 1 capsule (20 mg total) by mouth daily.   exemestane 25 MG tablet Commonly known as: AROMASIN TAKE ONE TABLET BY MOUTH DAILY AFTER BREAKFAST What changed: See the new instructions.   ezetimibe 10 MG tablet Commonly known as: ZETIA Take 1 tablet (10 mg total) by mouth daily.   famotidine 20 MG tablet Commonly known as: PEPCID TAKE ONE TABLET BY MOUTH TWICE DAILY. ONE AFTER SUPPER What changed: See the new instructions.   FreeStyle Libre 2   Reader Devi Use to check blood sugars daily   insulin glargine 100 UNIT/ML injection Commonly known as: LANTUS Inject 24 Units into the skin daily.   insulin lispro 100 UNIT/ML injection Commonly known as: HUMALOG Inject 14 Units into the skin 3 (three) times daily before meals.   metoprolol succinate 25 MG 24 hr tablet Commonly known as: TOPROL-XL TAKE ONE-HALF TABLET BY MOUTH EVERY DAY What changed: how to take this   nitroGLYCERIN 0.4 MG SL tablet Commonly known as: NITROSTAT Place 1 tablet (0.4 mg total) under the tongue every 5 (five) minutes as needed for chest pain.   oxyCODONE 5 MG immediate release tablet Commonly known as: Oxy IR/ROXICODONE Take 1 tablet (5 mg total) by mouth every 6 (six) hours as needed for moderate pain or breakthrough pain.   rosuvastatin 40  MG tablet Commonly known as: CRESTOR Take 1 tablet (40 mg total) by mouth at bedtime.   Symbicort 160-4.5 MCG/ACT inhaler Generic drug: budesonide-formoterol INHALE TWO PUFFS INTO THE LUNGS TWICE DAILY What changed: See the new instructions.   Trulicity 1.5 MG/0.5ML Sopn Generic drug: Dulaglutide INJECT THE CONTENTS OF ONE  PEN SUBCUTANEOUSLY WEEKLY  AS DIRECTED What changed: See the new instructions.   vitamin B-12 100 MCG tablet Commonly known as: CYANOCOBALAMIN Take 100 mcg by mouth daily.               Discharge Care Instructions  (From admission, onward)           Start     Ordered   05/12/21 0000  Leave dressing on - Keep it clean, dry, and intact until clinic visit        05/12/21 0932            Follow-up Information     Bokshan, Steven, MD Follow up.   Specialty: Orthopedic Surgery Contact information: [address] [place] [address] [phone]                Allergies  Allergen Reactions   Amoxicillin Rash and Other (See Comments)    Tolerates Cephalosporins Has patient had a PCN reaction causing immediate rash, facial/tongue/throat swelling, SOB or lightheadedness with hypotension: Yes Has patient had a PCN reaction causing severe rash involving mucus membranes or skin necrosis: Yes Has patient had a PCN reaction that required hospitalization No Has patient had a PCN reaction occurring within the last 10 years: No If all of the above answers are "NO", then may proceed with Cephalosporin use.    Tape Other (See Comments)    Pulls off skin, must use paper tape   Aldactone [Spironolactone] Other (See Comments)    CKD/hypokalemia   Isosorbide Nitrate     Other reaction(s): fainting   K-Flex [Orphenadrine]     Other reaction(s): stomach pain   Arimidex [Anastrozole] Nausea Only   Latex Itching and Other (See Comments)    (Dentist office)   Tetracycline Rash     Consultations: Nephrology Orthopedics   Procedures/Studies: DG Chest 1 View  Result Date: 05/06/2021 CLINICAL DATA:  Initial evaluation for acute trauma, fall. EXAM: CHEST  1 VIEW COMPARISON:  Prior radiograph from 08/10/2020. FINDINGS: Left-sided pacemaker/AICD in place. Median sternotomy wires underlying surgical clips and CABG markers noted. Valvular replacement noted as well. Transverse heart size at the upper limits of normal, stable. Mediastinal silhouette within normal limits. Aortic atherosclerosis. Lungs normally inflated. Mild linear subsegmental atelectasis present at the right lung base. Blunting of the right costophrenic angle suggestive of a   trace right pleural effusion. No pulmonary edema. No focal infiltrates. No pneumothorax. Osteopenia. No definite acute osseous abnormality. Advanced arthritic changes noted about the shoulders. IMPRESSION: 1. Trace right pleural effusion with associated mild right basilar subsegmental atelectasis. 2. No other active cardiopulmonary disease. 3.  Aortic Atherosclerosis (ICD10-I70.0). Electronically Signed   By: Jeannine Boga M.D.   On: 05/06/2021 19:27   DG Knee 1-2 Views Right  Result Date: 05/06/2021 CLINICAL DATA:  Initial evaluation for acute trauma, fall. EXAM: RIGHT KNEE - 1-2 VIEW COMPARISON:  None. FINDINGS: No acute fracture or dislocation. No joint effusion. Mild osteoarthritic changes about the knee. Advanced osteopenia. No acute soft tissue abnormality. Prominent vascular calcifications noted. Surgical clips from presumed previous vessel harvesting noted at the right lower extremity. IMPRESSION: 1. No acute osseous abnormality about the right knee. 2. Advanced osteopenia. Electronically Signed   By: Jeannine Boga M.D.   On: 05/06/2021 19:29   CT HEAD WO CONTRAST (5MM)  Result Date: 05/11/2021 CLINICAL DATA:  Mental status change EXAM: CT HEAD WITHOUT CONTRAST TECHNIQUE: Contiguous axial images were obtained from the  base of the skull through the vertex without intravenous contrast. COMPARISON:  CT head 04/26/2020 FINDINGS: Brain: Mild cerebral atrophy without hydrocephalus. Mild white matter hypoattenuation bilaterally. Negative for acute infarct, hemorrhage, mass Vascular: Negative for hyperdense vessel Skull: Negative Sinuses/Orbits: Paranasal sinuses clear. Bilateral cataract extraction Other: None IMPRESSION: No acute abnormality. Mild atrophy and mild chronic microvascular ischemic change. Electronically Signed   By: Franchot Gallo M.D.   On: 05/11/2021 14:20   DG C-Arm 1-60 Min-No Report  Result Date: 05/07/2021 Fluoroscopy was utilized by the requesting physician.  No radiographic interpretation.   DG C-Arm 1-60 Min-No Report  Result Date: 05/07/2021 Fluoroscopy was utilized by the requesting physician.  No radiographic interpretation.   CUP PACEART REMOTE DEVICE CHECK  Result Date: 05/11/2021 Scheduled remote reviewed. Normal device function.  Next remote 91 days. Kathy Breach, RN, CCDS, [employer]  DG Hip Unilat With Pelvis 2-3 Views Right  Result Date: 05/06/2021 CLINICAL DATA:  Initial evaluation for acute pain status post fall. EXAM: DG HIP (WITH OR WITHOUT PELVIS) 2-3V RIGHT COMPARISON:  None. FINDINGS: Acute comminuted intratrochanteric fracture of the right hip with superior subluxation. Femoral head remains normally position within the acetabulum. Femoral head height maintained. Remainder of the bony pelvis grossly intact. Underlying severe osteopenia. Moderate spondylosis noted within the lower lumbar spine. No visible soft tissue injury. Prominent vascular calcifications noted about the pelvis and proximal thighs. IMPRESSION: Acute comminuted intertrochanteric fracture of the right hip. Electronically Signed   By: Jeannine Boga M.D.   On: 05/06/2021 19:25   DG FEMUR, MIN 2 VIEWS RIGHT  Result Date: 05/07/2021 CLINICAL DATA:  Intertrochanteric fracture, surgery EXAM:  RIGHT FEMUR 2 VIEWS COMPARISON:  05/06/2021 FLUOROSCOPY TIME:  1 minute 52 seconds Dose: 15.91 mGy Images: 4 FINDINGS: Marked osseous demineralization. Images demonstrate placement of an IM nail with proximal compression screw and distal locking screw across a reduced intertrochanteric fracture of the RIGHT femur. No dislocation. Degenerative changes RIGHT knee joint. Extensive atherosclerotic calcifications. IMPRESSION: Post nailing of intertrochanteric fracture RIGHT femur. Marked osseous demineralization. Electronically Signed   By: Lavonia Dana M.D.   On: 05/07/2021 14:33   (Echo, Carotid, EGD, Colonoscopy, ERCP)    Subjective: Patient seen and examined.  Husband at the bedside.  Was able to get out of the bed with a walker and help with the nursing technician.  Moderate pain right thigh.  No more confusion.  Not using any sleeping pills.   Discharge Exam: Vitals:   05/12/21 0817 05/12/21 0855  BP: (!) 144/65   Pulse: 78   Resp: 18   Temp: 99.3 F (37.4 C)   SpO2: 98% 99%   Vitals:   05/12/21 0012 05/12/21 0400 05/12/21 0817 05/12/21 0855  BP: (!) 157/47 (!) 155/47 (!) 144/65   Pulse: 75 82 78   Resp: (!) 25 20 18   Temp: 98.6 F (37 C) 98.3 F (36.8 C) 99.3 F (37.4 C)   TempSrc: Oral Oral Oral   SpO2: 98% 97% 98% 99%  Weight:      Height:        General: Pt is alert, awake, not in acute distress On room air.  Sitting in chair. Cardiovascular: RRR, S1/S2 +, no rubs, no gallops Respiratory: CTA bilaterally, no wheezing, no rhonchi Abdominal: Soft, NT, ND, bowel sounds + Extremities: no edema, no cyanosis Dressings intact.  Dry and clean right lateral thigh incisions.    The results of significant diagnostics from this hospitalization (including imaging, microbiology, ancillary and laboratory) are listed below for reference.     Microbiology: Recent Results (from the past 240 hour(s))  Resp Panel by RT-PCR (Flu A&B, Covid) Nasopharyngeal Swab     Status: None    Collection Time: 05/06/21  8:18 PM   Specimen: Nasopharyngeal Swab; Nasopharyngeal(NP) swabs in vial transport medium  Result Value Ref Range Status   SARS Coronavirus 2 by RT PCR NEGATIVE NEGATIVE Final    Comment: (NOTE) SARS-CoV-2 target nucleic acids are NOT DETECTED.  The SARS-CoV-2 RNA is generally detectable in upper respiratory specimens during the acute phase of infection. The lowest concentration of SARS-CoV-2 viral copies this assay can detect is 138 copies/mL. A negative result does not preclude SARS-Cov-2 infection and should not be used as the sole basis for treatment or other patient management decisions. A negative result may occur with  improper specimen collection/handling, submission of specimen other than nasopharyngeal swab, presence of viral mutation(s) within the areas targeted by this assay, and inadequate number of viral copies(<138 copies/mL). A negative result must be combined with clinical observations, patient history, and epidemiological information. The expected result is Negative.  Fact Sheet for Patients:  https://www.fda.gov/media/152166/download  Fact Sheet for Healthcare Providers:  https://www.fda.gov/media/152162/download  This test is no t yet approved or cleared by the [place] FDA and  has been authorized for detection and/or diagnosis of SARS-CoV-2 by FDA under an Emergency Use Authorization (EUA). This EUA will remain  in effect (meaning this test can be used) for the duration of the COVID-19 declaration under Section 564(b)(1) of the Act, 21 U.S.C.section 360bbb-3(b)(1), unless the authorization is terminated  or revoked sooner.       Influenza A by PCR NEGATIVE NEGATIVE Final   Influenza B by PCR NEGATIVE NEGATIVE Final    Comment: (NOTE) The Xpert Xpress SARS-CoV-2/FLU/RSV plus assay is intended as an aid in the diagnosis of influenza from Nasopharyngeal swab specimens and should not be used as a sole basis for treatment.  Nasal washings and aspirates are unacceptable for Xpert Xpress SARS-CoV-2/FLU/RSV testing.  Fact Sheet for Patients: https://www.fda.gov/media/152166/download  Fact Sheet for Healthcare Providers: https://www.fda.gov/media/152162/download  This test is not yet approved or cleared by the [place] FDA and has been authorized for detection and/or diagnosis of SARS-CoV-2 by FDA under an Emergency Use Authorization (EUA). This EUA will remain in effect (meaning this test can be used) for the duration of   the COVID-19 declaration under Section 564(b)(1) of the Act, 21 U.S.C. section 360bbb-3(b)(1), unless the authorization is terminated or revoked.  Performed at [facility], [address] [place], [place] [address]   Surgical pcr screen     Status: None   Collection Time: 05/07/21  9:28 AM   Specimen: Nasal Mucosa; Nasal Swab  Result Value Ref Range Status   MRSA, PCR NEGATIVE NEGATIVE Final   Staphylococcus aureus NEGATIVE NEGATIVE Final    Comment: (NOTE) The Xpert SA Assay (FDA approved for NASAL specimens in patients 22 years of age and older), is one component of a comprehensive surveillance program. It is not intended to diagnose infection nor to guide or monitor treatment. Performed at [facility], [address] [place], [place] [address]      Labs: BNP (last 3 results) Recent Labs    06/11/20 1104  BNP 318.2*   Basic Metabolic Panel: Recent Labs  Lab 05/06/21 2018 05/07/21 0529 05/08/21 0635 05/09/21 0142 05/10/21 0252 05/11/21 0212 05/12/21 0310  NA  --  134* 135 136 134* 137 137  K  --  5.0 5.3* 4.8 4.8 4.6 4.2  CL  --  103 103 105 107 107 109  CO2  --  24 23 22 19* 20* 20*  GLUCOSE  --  155* 202* 132* 186* 162* 101*  BUN  --  27* 43* 57* 64* 66* 63*  CREATININE  --  2.33* 3.49* 4.24* 4.51* 4.37* 4.33*  CALCIUM  --  8.7* 8.5* 8.4* 8.2* 8.4* 8.4*  MG 2.0 1.9 2.0  --   --   --   --   PHOS  --   --   --   --  5.0* 4.3 3.7   Liver  Function Tests: Recent Labs  Lab 05/07/21 0529 05/10/21 0252 05/11/21 0212 05/12/21 0310  AST 67*  --   --   --   ALT 45*  --   --   --   ALKPHOS 58  --   --   --   BILITOT 0.8  --   --   --   PROT 5.3*  --   --   --   ALBUMIN 2.5* 2.7* 2.6* 2.4*   No results for input(s): LIPASE, AMYLASE in the last 168 hours. No results for input(s): AMMONIA in the last 168 hours. CBC: Recent Labs  Lab 05/08/21 0635 05/09/21 0142 05/10/21 0252 05/10/21 1611 05/11/21 0212 05/12/21 0310  WBC 13.5* 12.4* 6.8  --  8.1 7.0  NEUTROABS 11.5* 10.0* 5.4  --  6.8 5.5  HGB 7.0* 8.3* 7.0* 8.1* 8.7* 8.5*  HCT 23.8* 27.2* 22.8* 25.9* 27.4* 26.9*  MCV 98.3 93.5 95.4  --  93.5 92.4  PLT 127* 93* 68*  --  92* 97*   Cardiac Enzymes: No results for input(s): CKTOTAL, CKMB, CKMBINDEX, TROPONINI in the last 168 hours. BNP: Invalid input(s): POCBNP CBG: Recent Labs  Lab 05/11/21 0611 05/11/21 1133 05/11/21 1542 05/12/21 0006 05/12/21 0634  GLUCAP 121* 167* 94 109* 114*   D-Dimer No results for input(s): DDIMER in the last 72 hours. Hgb A1c No results for input(s): HGBA1C in the last 72 hours. Lipid Profile No results for input(s): CHOL, HDL, LDLCALC, TRIG, CHOLHDL, LDLDIRECT in the last 72 hours. Thyroid function studies No results for input(s): TSH, T4TOTAL, T3FREE, THYROIDAB in the last 72 hours.  Invalid input(s): FREET3 Anemia work up No results for input(s): VITAMINB12, FOLATE, FERRITIN, TIBC, IRON, RETICCTPCT in   the last 72 hours. Urinalysis    Component Value Date/Time   COLORURINE AMBER (A) 05/06/2021 2018   APPEARANCEUR CLOUDY (A) 05/06/2021 2018   LABSPEC 1.014 05/06/2021 2018   LABSPEC 1.020 02/24/2016 1119   PHURINE 7.0 05/06/2021 2018   GLUCOSEU >=500 (A) 05/06/2021 2018   GLUCOSEU 500 (A) 07/02/2019 1501   GLUCOSEU Negative 02/24/2016 1119   HGBUR MODERATE (A) 05/06/2021 2018   BILIRUBINUR NEGATIVE 05/06/2021 2018   BILIRUBINUR Negative 02/24/2016 1119   KETONESUR  NEGATIVE 05/06/2021 2018   PROTEINUR 100 (A) 05/06/2021 2018   UROBILINOGEN 0.2 07/02/2019 1501   UROBILINOGEN 0.2 02/24/2016 1119   NITRITE NEGATIVE 05/06/2021 2018   LEUKOCYTESUR TRACE (A) 05/06/2021 2018   LEUKOCYTESUR Trace 02/24/2016 1119   Sepsis Labs Invalid input(s): PROCALCITONIN,  WBC,  LACTICIDVEN Microbiology Recent Results (from the past 240 hour(s))  Resp Panel by RT-PCR (Flu A&B, Covid) Nasopharyngeal Swab     Status: None   Collection Time: 05/06/21  8:18 PM   Specimen: Nasopharyngeal Swab; Nasopharyngeal(NP) swabs in vial transport medium  Result Value Ref Range Status   SARS Coronavirus 2 by RT PCR NEGATIVE NEGATIVE Final    Comment: (NOTE) SARS-CoV-2 target nucleic acids are NOT DETECTED.  The SARS-CoV-2 RNA is generally detectable in upper respiratory specimens during the acute phase of infection. The lowest concentration of SARS-CoV-2 viral copies this assay can detect is 138 copies/mL. A negative result does not preclude SARS-Cov-2 infection and should not be used as the sole basis for treatment or other patient management decisions. A negative result may occur with  improper specimen collection/handling, submission of specimen other than nasopharyngeal swab, presence of viral mutation(s) within the areas targeted by this assay, and inadequate number of viral copies(<138 copies/mL). A negative result must be combined with clinical observations, patient history, and epidemiological information. The expected result is Negative.  Fact Sheet for Patients:  https://www.fda.gov/media/152166/download  Fact Sheet for Healthcare Providers:  https://www.fda.gov/media/152162/download  This test is no t yet approved or cleared by the [place] FDA and  has been authorized for detection and/or diagnosis of SARS-CoV-2 by FDA under an Emergency Use Authorization (EUA). This EUA will remain  in effect (meaning this test can be used) for the duration of  the COVID-19 declaration under Section 564(b)(1) of the Act, 21 U.S.C.section 360bbb-3(b)(1), unless the authorization is terminated  or revoked sooner.       Influenza A by PCR NEGATIVE NEGATIVE Final   Influenza B by PCR NEGATIVE NEGATIVE Final    Comment: (NOTE) The Xpert Xpress SARS-CoV-2/FLU/RSV plus assay is intended as an aid in the diagnosis of influenza from Nasopharyngeal swab specimens and should not be used as a sole basis for treatment. Nasal washings and aspirates are unacceptable for Xpert Xpress SARS-CoV-2/FLU/RSV testing.  Fact Sheet for Patients: https://www.fda.gov/media/152166/download  Fact Sheet for Healthcare Providers: https://www.fda.gov/media/152162/download  This test is not yet approved or cleared by the [place] FDA and has been authorized for detection and/or diagnosis of SARS-CoV-2 by FDA under an Emergency Use Authorization (EUA). This EUA will remain in effect (meaning this test can be used) for the duration of the COVID-19 declaration under Section 564(b)(1) of the Act, 21 U.S.C. section 360bbb-3(b)(1), unless the authorization is terminated or revoked.  Performed at [facility], [address] , [place] [address]   Surgical pcr screen     Status: None   Collection Time: 05/07/21  9:28 AM   Specimen: Nasal Mucosa; Nasal Swab  Result Value   Ref Range Status   MRSA, PCR NEGATIVE NEGATIVE Final   Staphylococcus aureus NEGATIVE NEGATIVE Final    Comment: (NOTE) The Xpert SA Assay (FDA approved for NASAL specimens in patients 84 years of age and older), is one component of a comprehensive surveillance program. It is not intended to diagnose infection nor to guide or monitor treatment. Performed at [facility], [place] [address], [place], [place] [address]      Time coordinating discharge: 40 minutes  SIGNED:   Barb Merino, MD  [facility] 05/12/2021, 10:58 AM

## 2021-05-12 NOTE — Progress Notes (Signed)
Patient discharged around 1500

## 2021-05-12 NOTE — TOC Transition Note (Signed)
Transition of Care [facility]) - CM/SW Discharge Note   Patient Details  Name: Hannah [name] MRN: [id number] Date of Birth: 1945-09-30  [facility] of [facility]) CM/SW Contact:  Joanne Chars, LCSW Phone Number: 05/12/2021, 12:12 PM   Clinical Narrative:   Pt discharging to [facility].  RN call [phone] for report.  [facility] transport will pick pt up between 230-3pm.     Final next level of care: [place] Barriers to Discharge: Barriers Resolved   Patient Goals and CMS Choice Patient states their goals for this hospitalization and ongoing recovery are:: "do for myself" CMS Medicare.gov Compare Post Acute Care list provided to:: Patient Choice offered to / list presented to : Patient  Discharge Placement              Patient chooses bed at:  ([facility]) Patient to be transferred to facility by: [facility] Name of family member notified: husband [name] in room Patient and family notified of of transfer: 05/12/21  Discharge Plan and Services     Post Acute Care Choice: [facility]                               Social Determinants of Health (SDOH) Interventions     Readmission Risk Interventions No flowsheet data found.

## 2021-05-12 NOTE — Care Management Important Message (Signed)
Important Message  Patient Details  Name: Hannah [name] MRN: [id number] Date of Birth: 12-25-1945   Medicare Important Message Given:  Yes     Hannah Beat 05/12/2021, 1:18 PM

## 2021-05-15 NOTE — Progress Notes (Signed)
CKD stage 3B

## 2021-05-18 ENCOUNTER — Telehealth: Payer: Self-pay | Admitting: Hematology and Oncology

## 2021-05-18 NOTE — Telephone Encounter (Signed)
Scheduled per sch msg. Called and spoke with patient. Confirmd appt

## 2021-05-19 ENCOUNTER — Inpatient Hospital Stay: Payer: Medicare Other

## 2021-05-23 NOTE — Progress Notes (Signed)
Remote pacemaker transmission.   

## 2021-05-29 ENCOUNTER — Telehealth: Payer: Self-pay

## 2021-05-29 ENCOUNTER — Encounter (HOSPITAL_BASED_OUTPATIENT_CLINIC_OR_DEPARTMENT_OTHER): Payer: Medicare Other | Admitting: Orthopaedic Surgery

## 2021-05-29 ENCOUNTER — Other Ambulatory Visit (HOSPITAL_BASED_OUTPATIENT_CLINIC_OR_DEPARTMENT_OTHER): Payer: Self-pay | Admitting: Orthopaedic Surgery

## 2021-05-29 DIAGNOSIS — S72141G Displaced intertrochanteric fracture of right femur, subsequent encounter for closed fracture with delayed healing: Secondary | ICD-10-CM

## 2021-05-29 DIAGNOSIS — S42292D Other displaced fracture of upper end of left humerus, subsequent encounter for fracture with routine healing: Secondary | ICD-10-CM

## 2021-05-29 NOTE — Telephone Encounter (Signed)
Attempted to contact patient about [employer] monitor not connected within 21 days. No answer, LMTCB.

## 2021-06-02 NOTE — Telephone Encounter (Signed)
Pt monitor has connected on 05/30/2021

## 2021-06-08 ENCOUNTER — Other Ambulatory Visit: Payer: Self-pay | Admitting: *Deleted

## 2021-06-08 DIAGNOSIS — D509 Iron deficiency anemia, unspecified: Secondary | ICD-10-CM

## 2021-06-08 DIAGNOSIS — C50211 Malignant neoplasm of upper-inner quadrant of right female breast: Secondary | ICD-10-CM

## 2021-06-09 ENCOUNTER — Inpatient Hospital Stay: Payer: Medicare Other

## 2021-06-11 DEATH — deceased

## 2021-06-22 ENCOUNTER — Other Ambulatory Visit: Payer: Self-pay | Admitting: Endocrinology

## 2021-06-22 ENCOUNTER — Telehealth: Payer: Self-pay

## 2021-06-22 NOTE — Telephone Encounter (Signed)
LMOVM for patient to check her monitor and give [place] a call back.

## 2021-07-07 ENCOUNTER — Ambulatory Visit: Payer: Medicare Other

## 2021-07-07 ENCOUNTER — Other Ambulatory Visit: Payer: Medicare Other

## 2021-07-07 ENCOUNTER — Ambulatory Visit: Payer: Medicare Other | Admitting: Hematology and Oncology

## 2021-07-17 IMAGING — CT CT RENAL STONE PROTOCOL
2 of 4 series · 16 of 46 positions shown, 18 images · non-contrast
Comparison: 07/28/2019

CLINICAL DATA: Right flank pain radiating to right lower abdomen,
nausea and vomiting for 6 hours

EXAM:
CT ABDOMEN AND PELVIS WITHOUT CONTRAST
TECHNIQUE: Multidetector CT imaging of the abdomen and pelvis was performed
following the standard protocol without IV contrast.

[Series 2: axial st · axial · 0.78mm/px · z∈[-312,+78]mm · 13 of 86 slices shown, 15 images]
[im 4/86  soft-tissue]
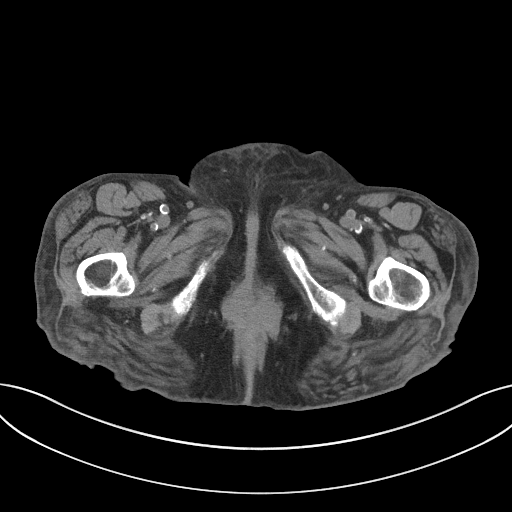
[im 4/86  bone]
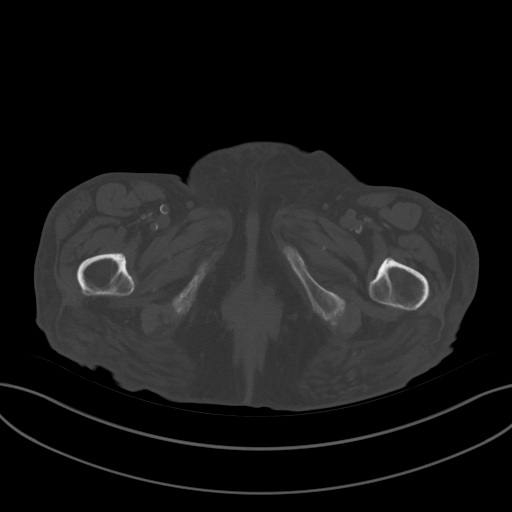
[im 11/86  soft-tissue]
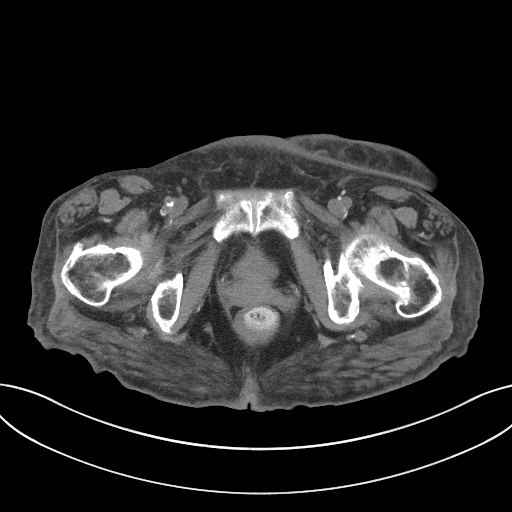
[im 18/86  soft-tissue]
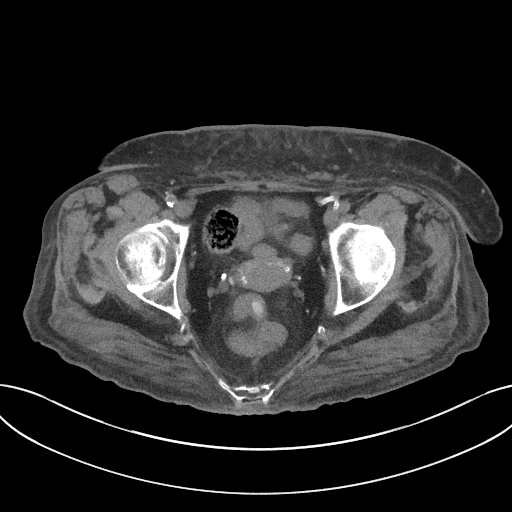
[im 25/86  soft-tissue]
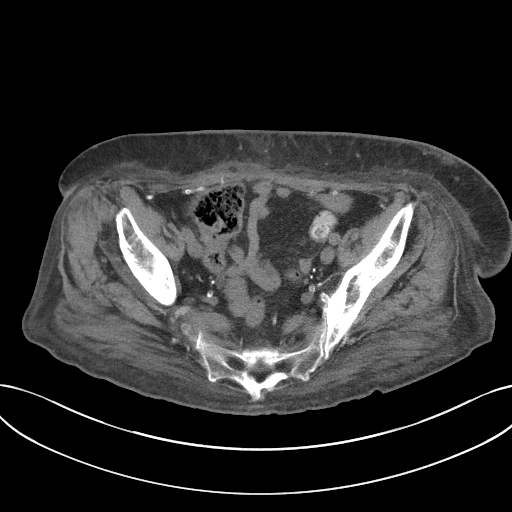
[im 29/86  soft-tissue]
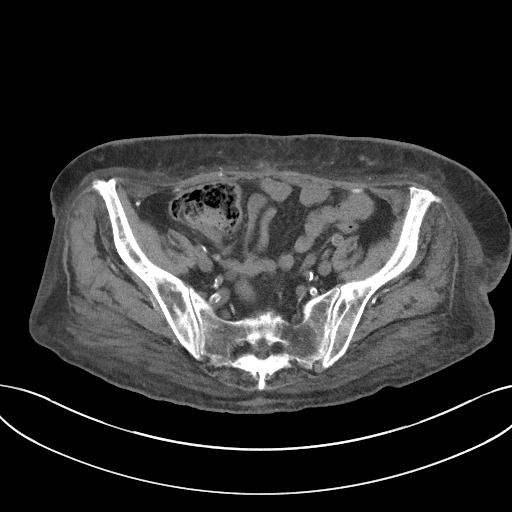
[im 36/86  soft-tissue]
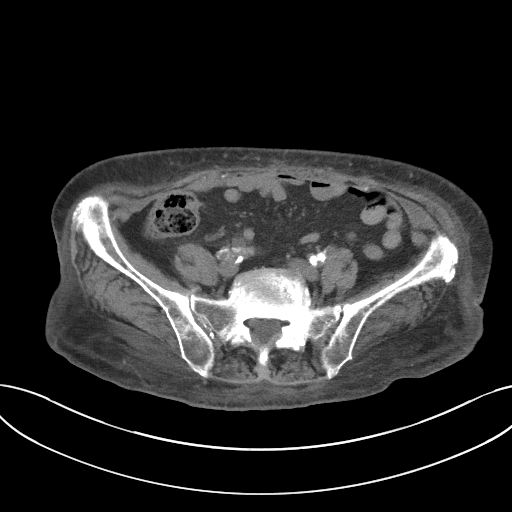
[im 43/86  soft-tissue]
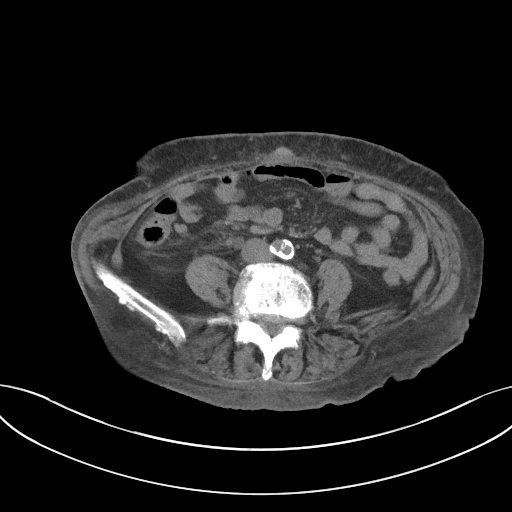
[im 50/86  soft-tissue]
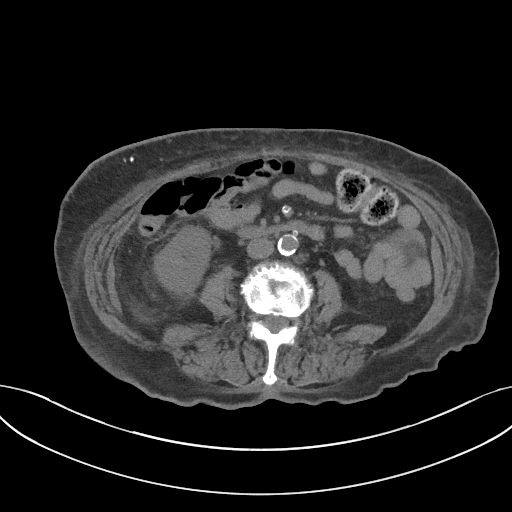
[im 57/86  soft-tissue]
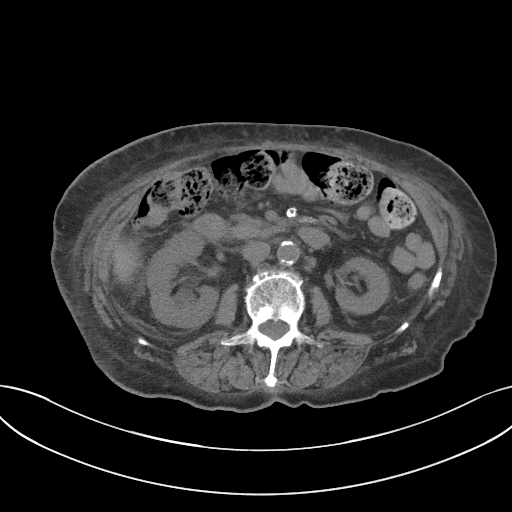
[im 57/86  bone]
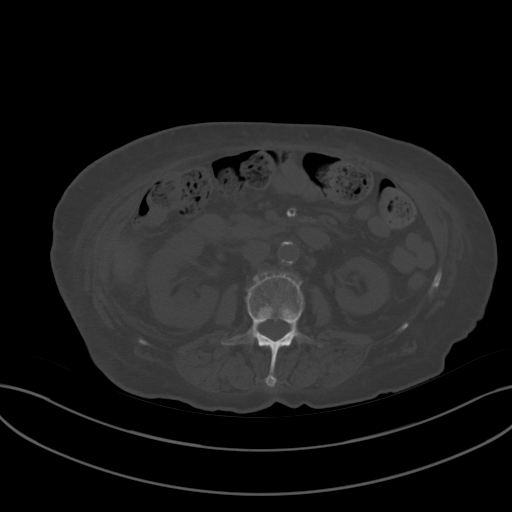
[im 61/86  soft-tissue]
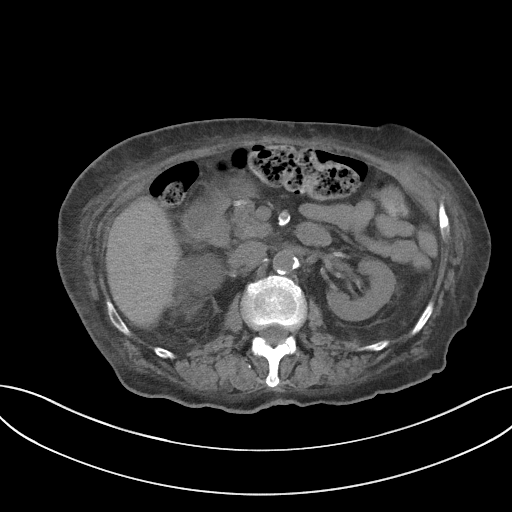
[im 68/86  soft-tissue]
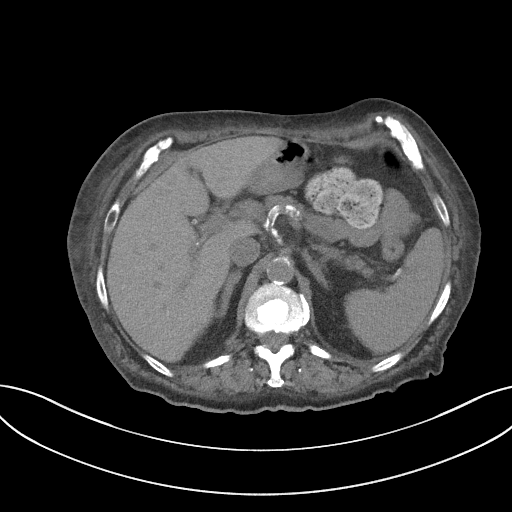
[im 75/86  soft-tissue]
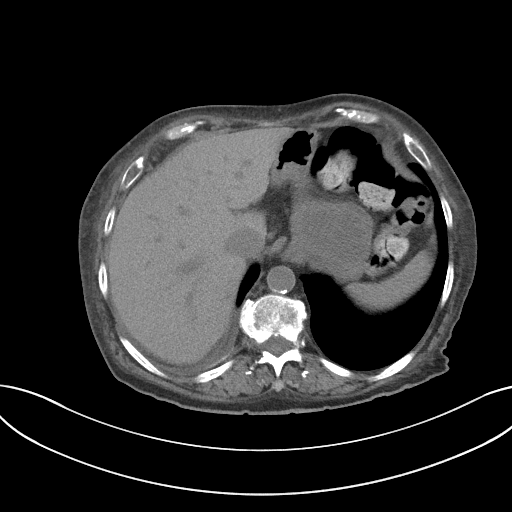
[im 82/86  soft-tissue]
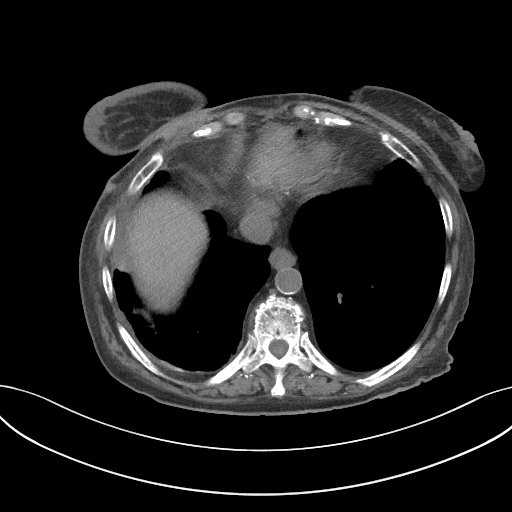

[Series 4: coronal st · coronal · 0.80mm/px · 3 of 80 slices shown]
[im 27/80  soft-tissue]
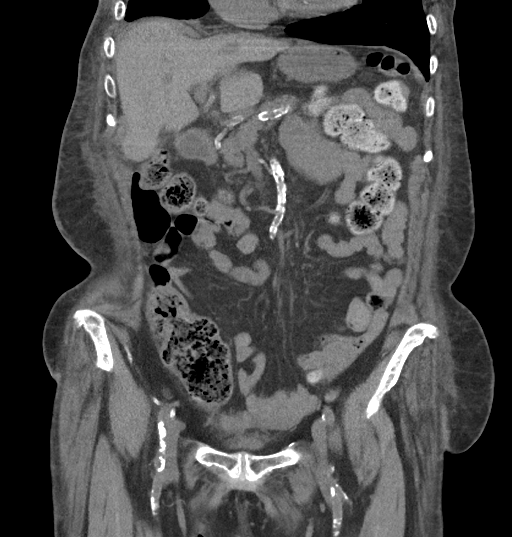
[im 36/80  soft-tissue]
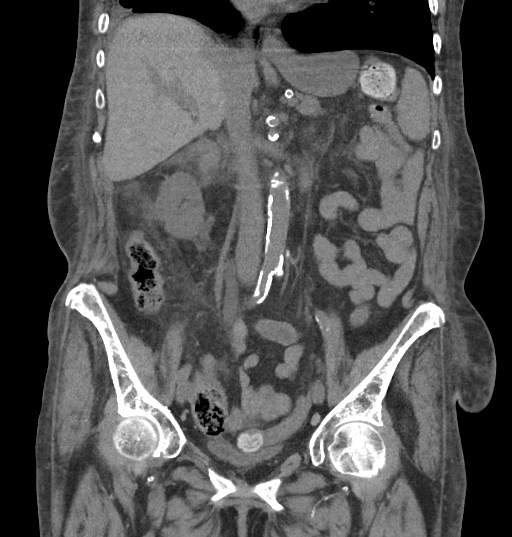
[im 44/80  soft-tissue]
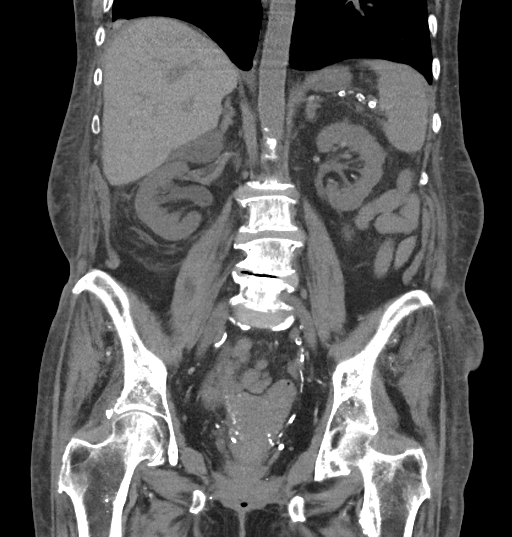

[16 of 46 positions shown; findings below may reference images not displayed]

FINDINGS: Lower chest: Trace right pleural effusion with right pleural
calcifications unchanged. The stable skin thickening throughout the
right breast, please correlate with any recent mammographic
evaluation.

Hepatobiliary: Stable right lobe hepatic cyst. No focal liver
abnormalities otherwise. Status post cholecystectomy. No biliary
dilatation.

Pancreas: Unremarkable. No pancreatic ductal dilatation or
surrounding inflammatory changes.

Spleen: Normal in size without focal abnormality.

Adrenals/Urinary Tract: There is right-sided obstructive uropathy
related to an obstructing right UVJ calculus, measuring 4 mm on
image 74. Numerous calcifications of the right renal hilum are
consistent with vascular calcifications.

The left kidney is unremarkable. Bladder is decompressed. The
adrenals are normal.

Stomach/Bowel: No bowel obstruction or ileus. Normal appendix right
lower quadrant. No bowel wall thickening or inflammatory changes.

Vascular/Lymphatic: Aortic atherosclerosis. No enlarged abdominal or
pelvic lymph nodes.

Reproductive: Uterus and bilateral adnexa are unremarkable.

Other: No abdominal wall hernia or abnormality. No abdominopelvic
ascites.

Musculoskeletal: No acute or destructive bony lesions. Reconstructed
images demonstrate no additional findings.
IMPRESSION: 1. Right-sided obstructive uropathy related to an obstructing 4 mm
right UVJ calculus.
2. Stable trace right pleural effusion with right pleural
calcifications.
3. Stable skin thickening throughout the right breast, likely
related to prior therapy for right breast cancer 3253. Please
correlate with any recent mammographic evaluation.
4. Aortic Atherosclerosis ([id number]).

## 2021-07-28 ENCOUNTER — Encounter: Payer: Self-pay | Admitting: Hematology and Oncology

## 2021-08-04 ENCOUNTER — Other Ambulatory Visit: Payer: Medicare Other

## 2021-08-04 ENCOUNTER — Ambulatory Visit: Payer: Medicare Other

## 2021-08-11 ENCOUNTER — Ambulatory Visit: Payer: Self-pay | Admitting: Endocrinology

## 2021-10-07 IMAGING — CT CT HEAD W/O CM
3 series · 15 of 46 positions shown, 18 images · non-contrast
Comparison: 4054

CLINICAL DATA: Fall

EXAM:
CT HEAD WITHOUT CONTRAST
TECHNIQUE: Contiguous axial images were obtained from the base of the skull
through the vertex without intravenous contrast.

[Series 2: head 5.0 h30s · axial · 0.43mm/px · z∈[-142,-22]mm · 9 of 29 slices shown, 12 images]
[im 3/29  brain]
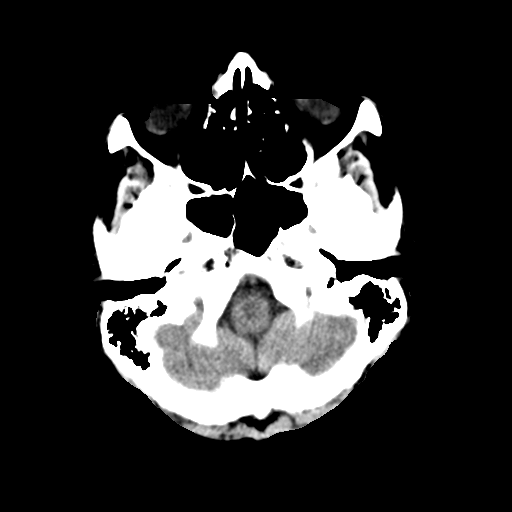
[im 3/29  bone]
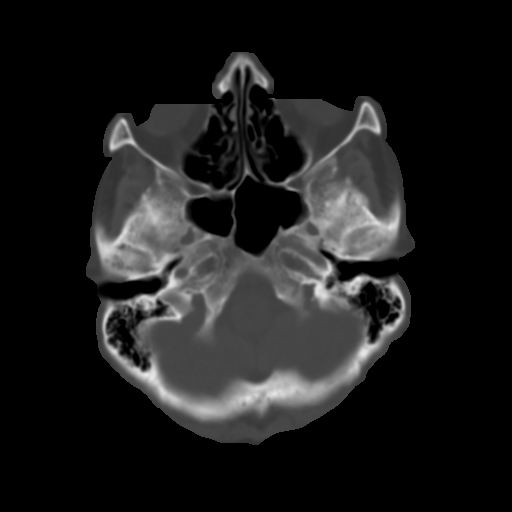
[im 6/29  brain]
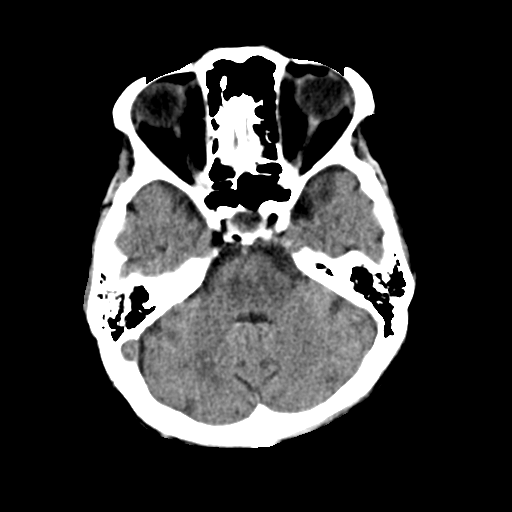
[im 9/29  brain]
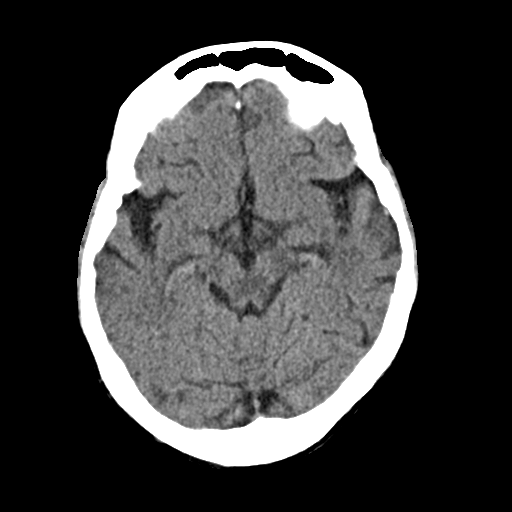
[im 12/29  brain]
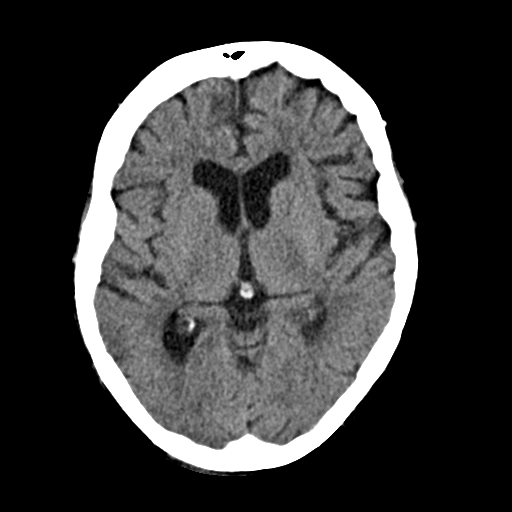
[im 15/29  brain]
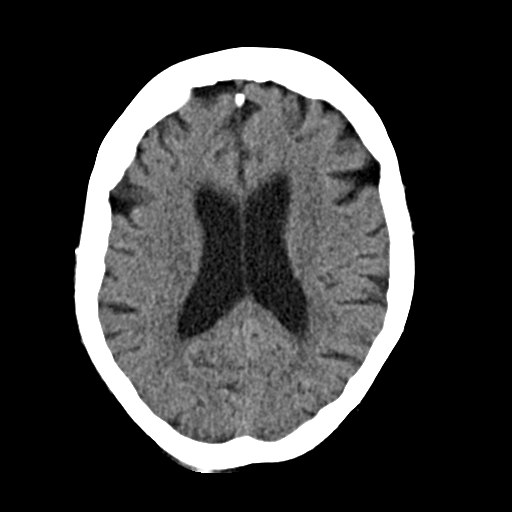
[im 15/29  bone]
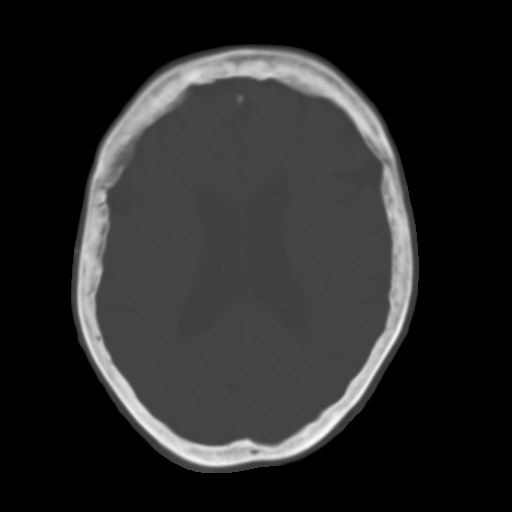
[im 18/29  brain]
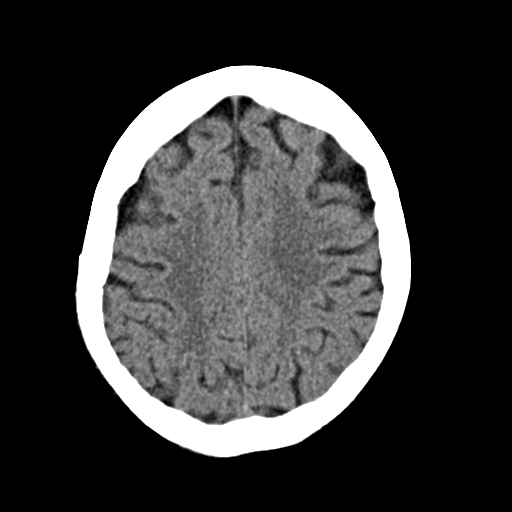
[im 21/29  brain]
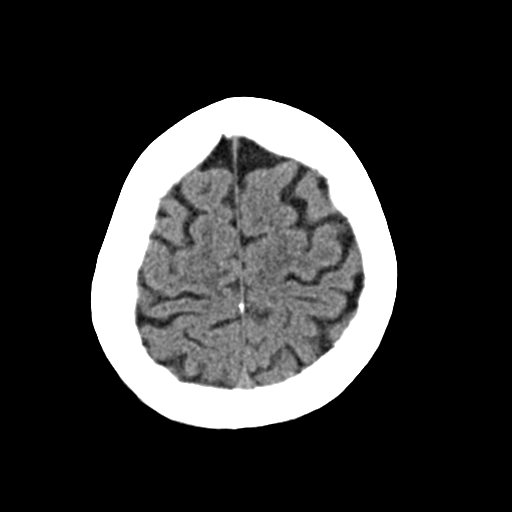
[im 24/29  brain]
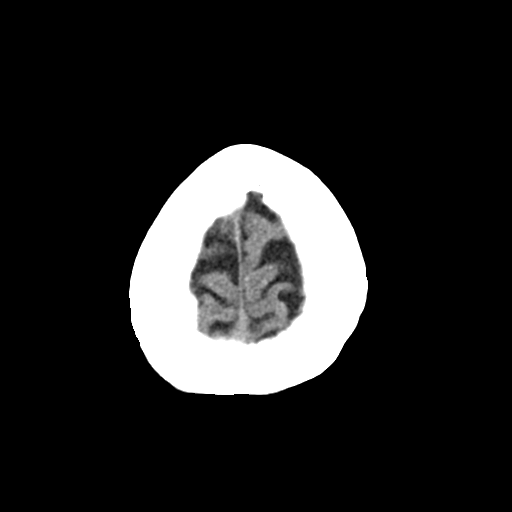
[im 27/29  brain]
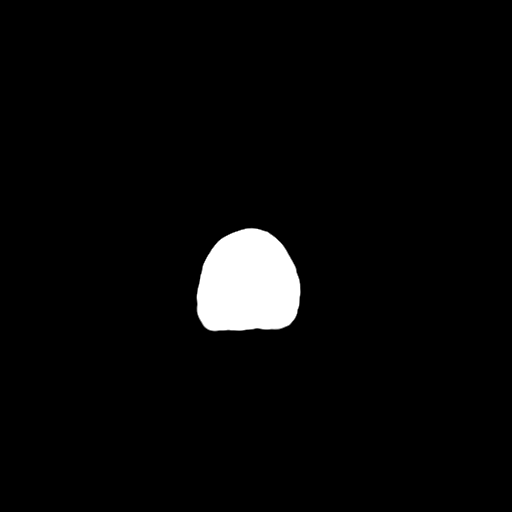
[im 27/29  bone]
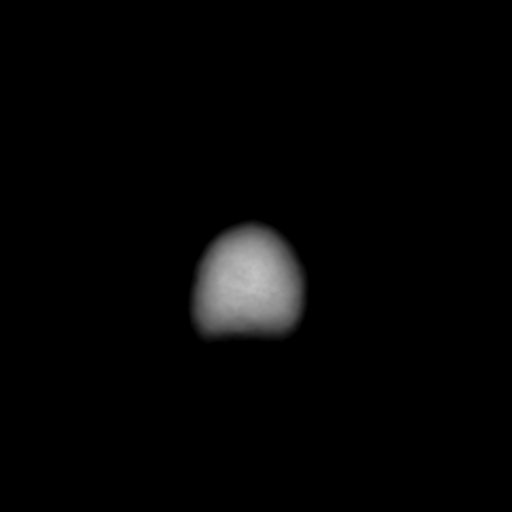

[Series 4: head 3.0 mpr cor · coronal · 0.29mm/px · 3 of 64 slices shown]
[im 22/64  brain]
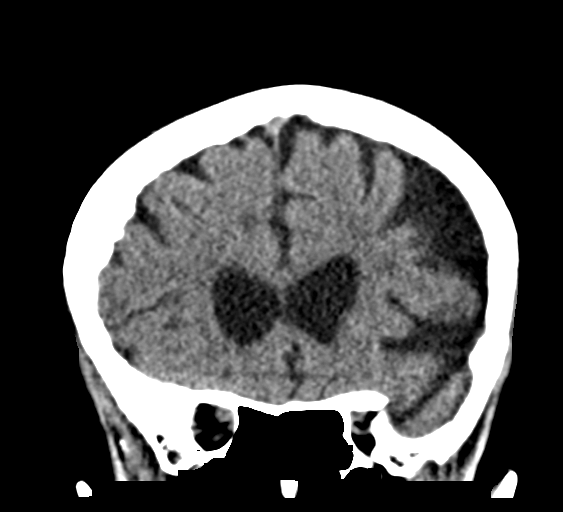
[im 29/64  brain]
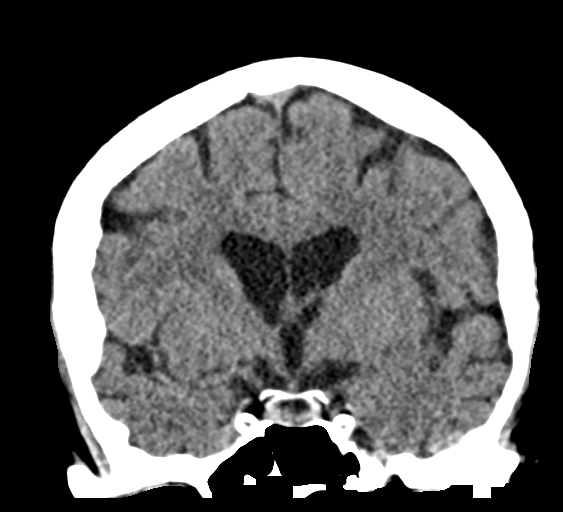
[im 36/64  brain]
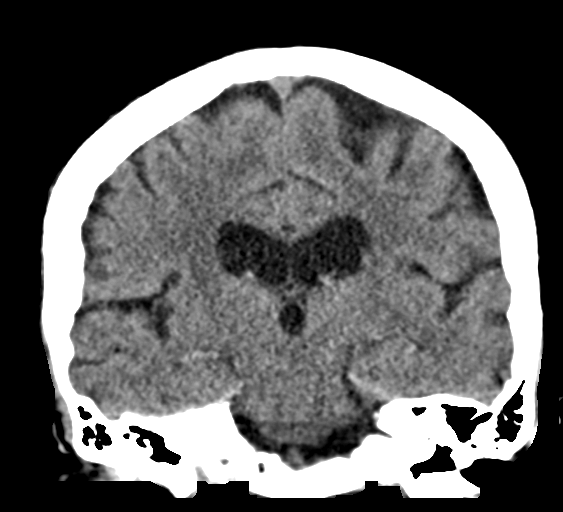

[Series 5: head 3.0 mpr sag · sagittal · 0.29mm/px · 3 of 52 slices shown]
[im 18/52  brain]
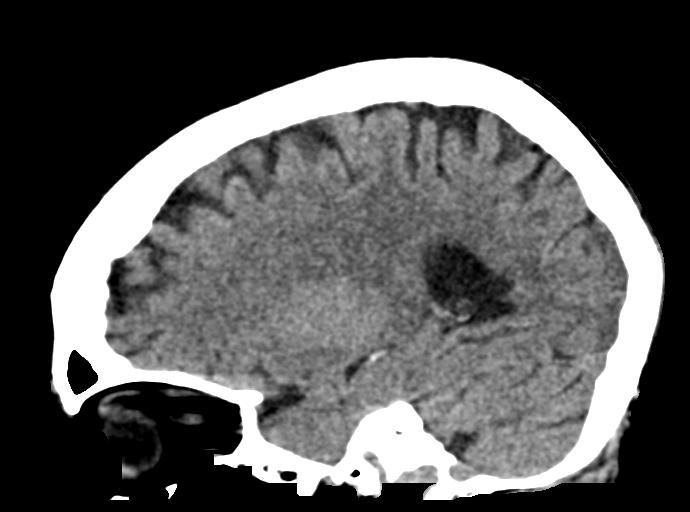
[im 26/52  brain]
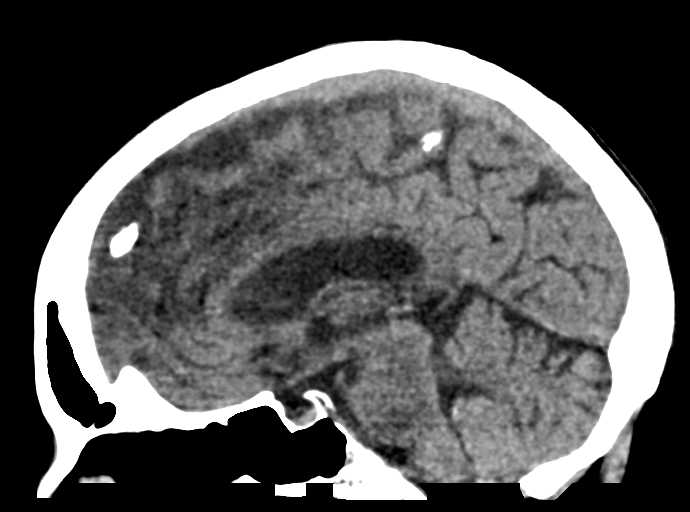
[im 35/52  brain]
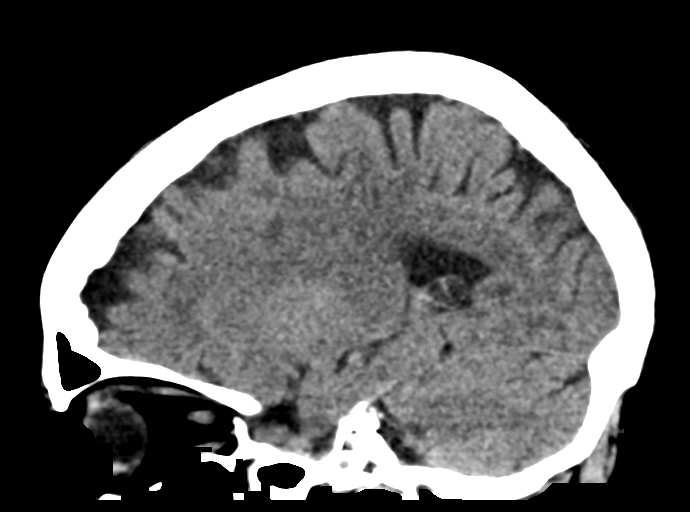

[15 of 46 positions shown; findings below may reference images not displayed]

FINDINGS: Brain: There is no acute intracranial hemorrhage, mass effect, or
edema. Gray-white differentiation is preserved. There is no
extra-axial fluid collection. Ventricles and sulci are stable in
size and configuration. Minimal patchy hypoattenuation in the
supratentorial white matter is nonspecific but may reflect minor
chronic microvascular ischemic changes.

Vascular: There is atherosclerotic calcification at the skull base.

Skull: Calvarium is unremarkable.

Sinuses/Orbits: No acute finding.

Other: None.
IMPRESSION: No evidence of acute intracranial injury.

## 2021-10-07 IMAGING — CT CT CERVICAL SPINE W/O CM
3 of 4 series · 12 of 33 positions shown, 14 images · non-contrast
Comparison: None.

CLINICAL DATA: Fall

EXAM:
CT CERVICAL SPINE WITHOUT CONTRAST
TECHNIQUE: Multidetector CT imaging of the cervical spine was performed without
intravenous contrast. Multiplanar CT image reconstructions were also
generated.

[Series 5: coronals · coronal · 0.29mm/px · 3 of 79 slices shown]
[im 16/79  bone]
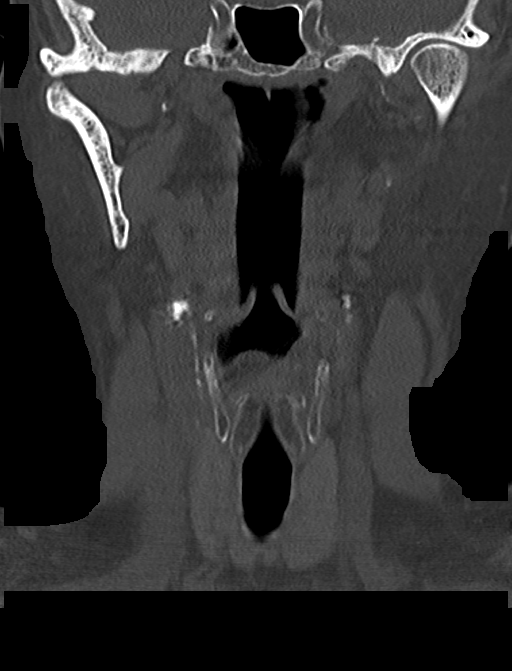
[im 32/79  bone]
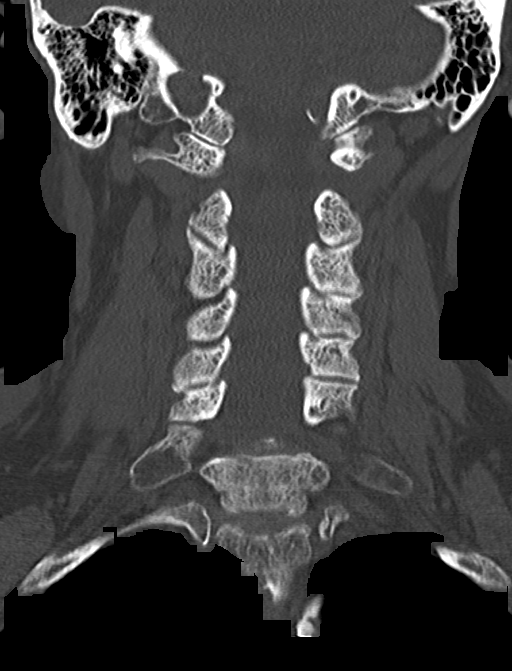
[im 47/79  bone]
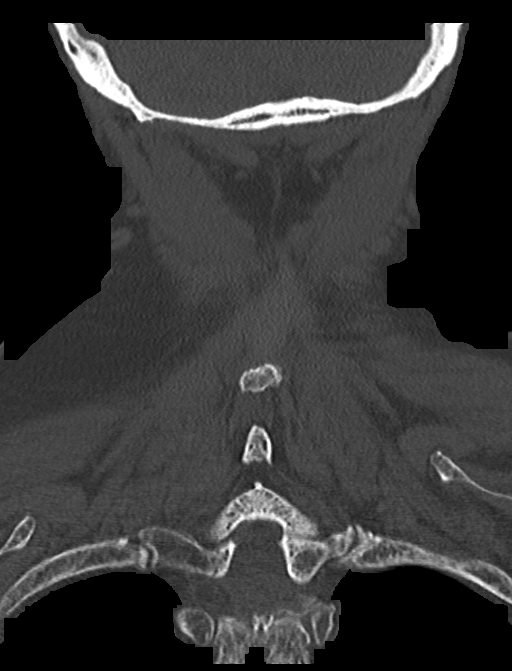

[Series 6: sagittals · sagittal · 0.26mm/px · 5 of 61 slices shown, 6 images]
[im 21/61  bone]
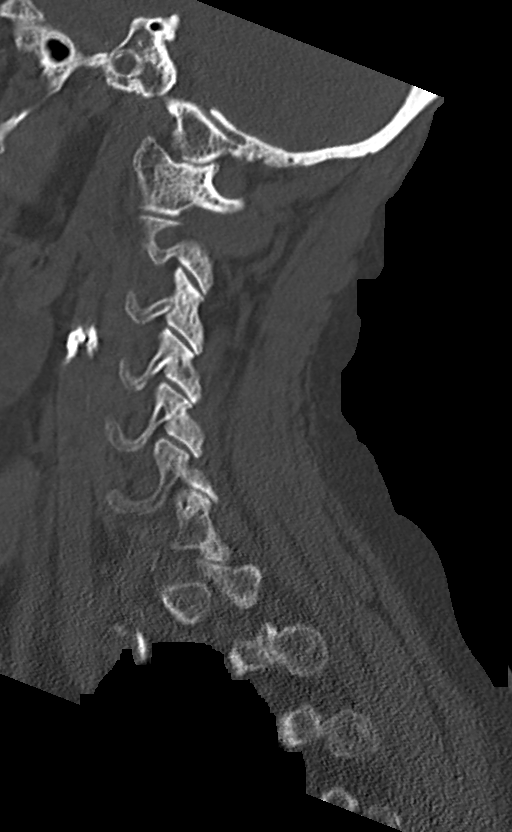
[im 26/61  bone]
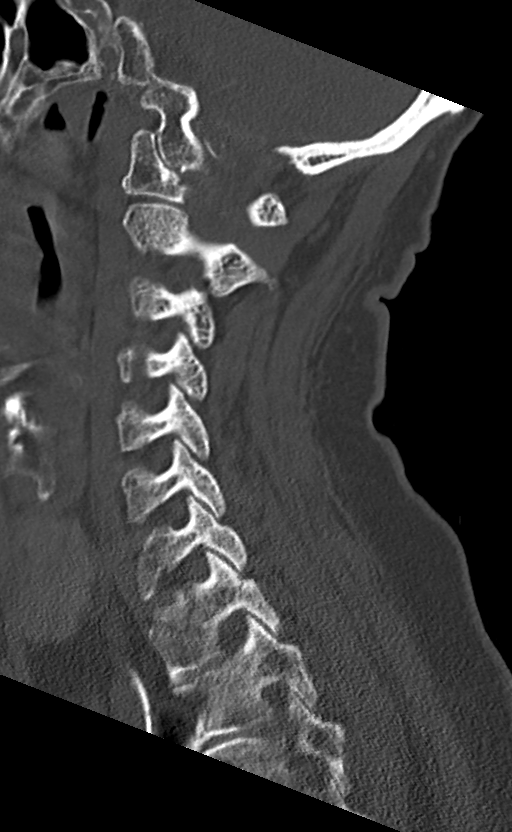
[im 31/61  soft-tissue]
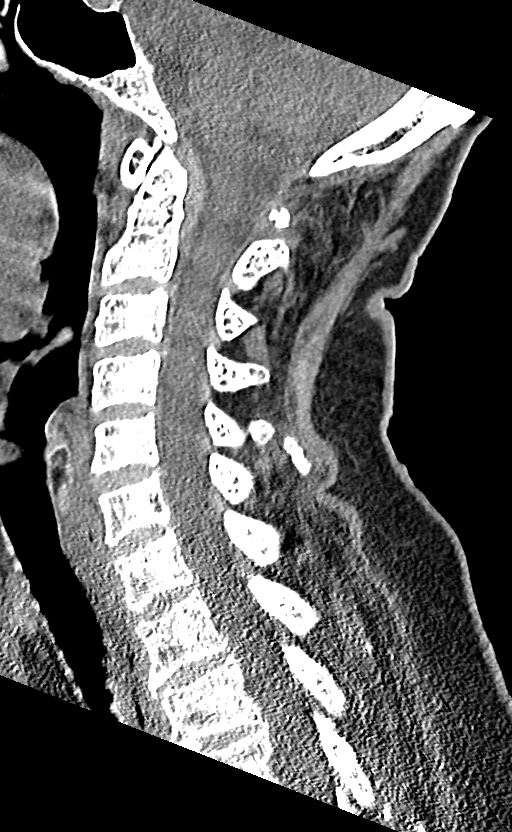
[im 31/61  bone]
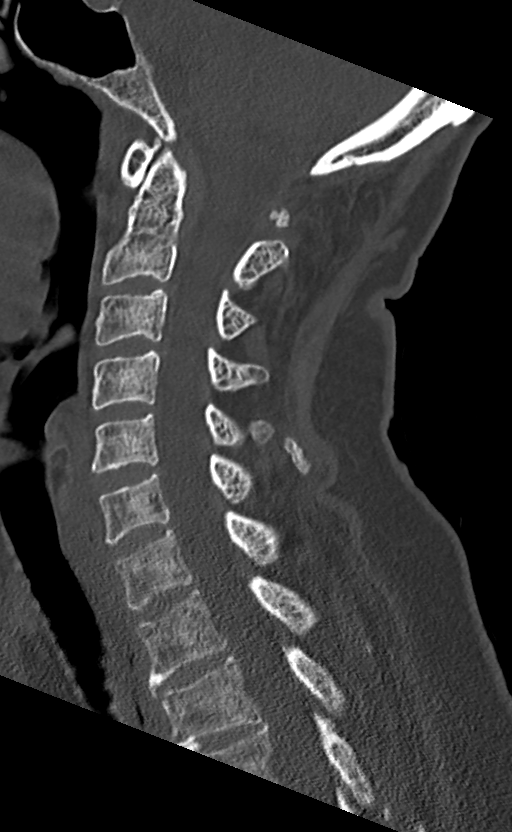
[im 36/61  bone]
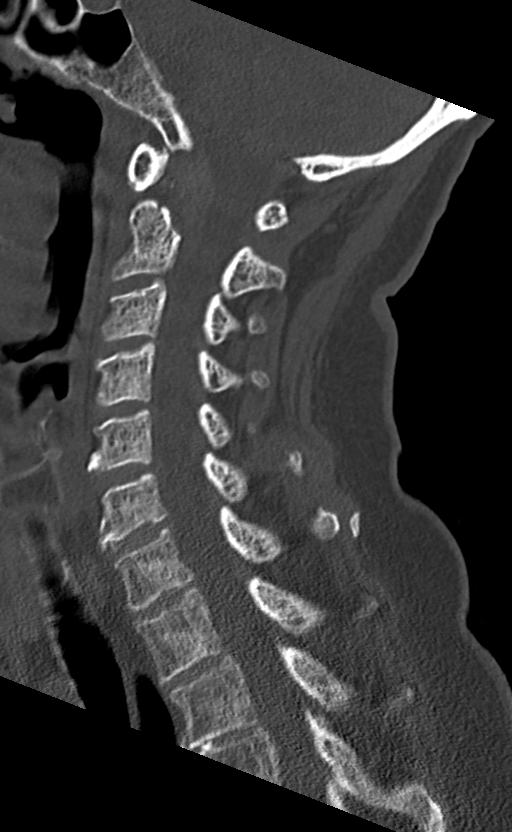
[im 41/61  bone]
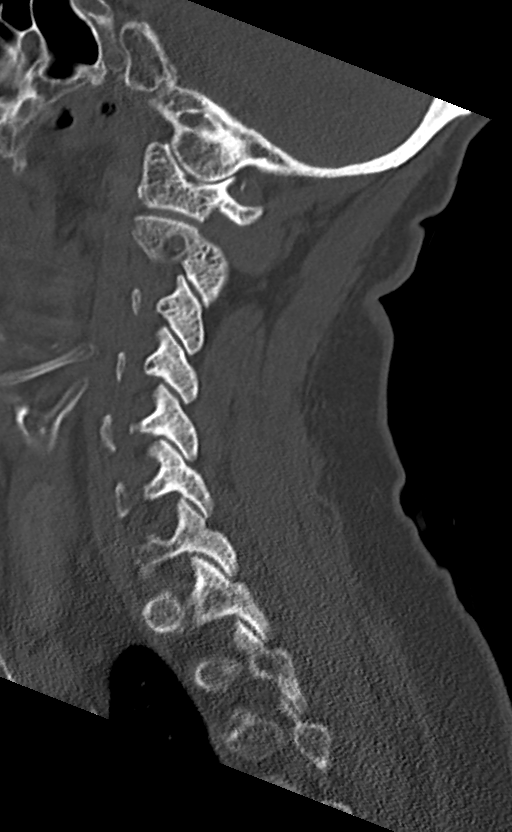

[Series 7: orthogonals · axial · 0.30mm/px · z∈[-309,-163]mm · 4 of 109 slices shown, 5 images]
[im 16/109  soft-tissue]
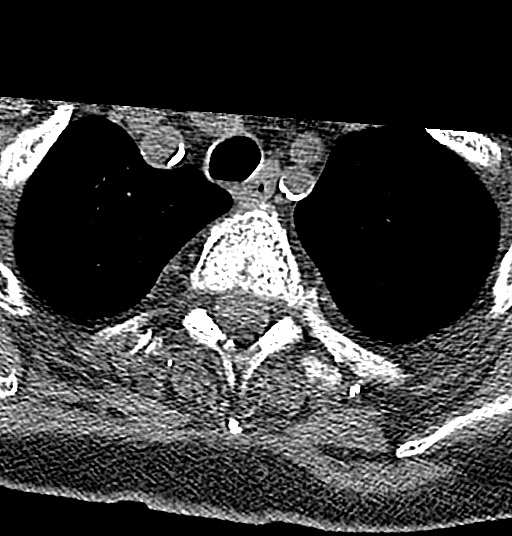
[im 16/109  bone]
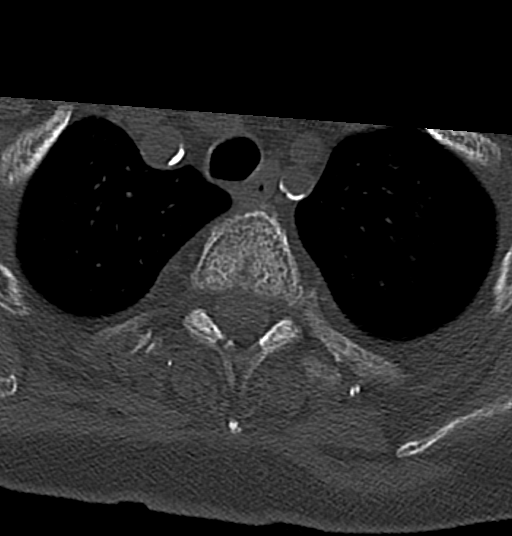
[im 47/109  bone]
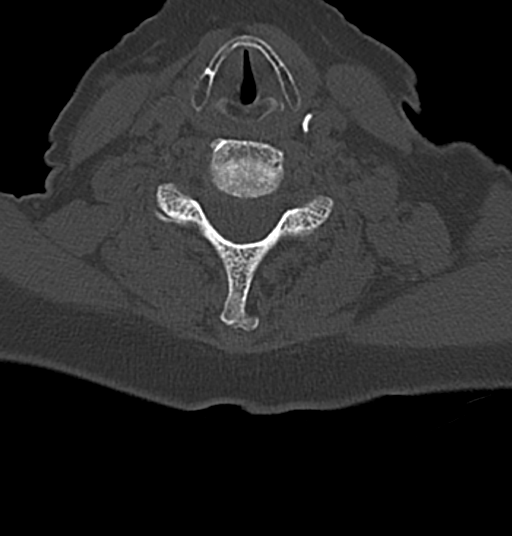
[im 62/109  bone]
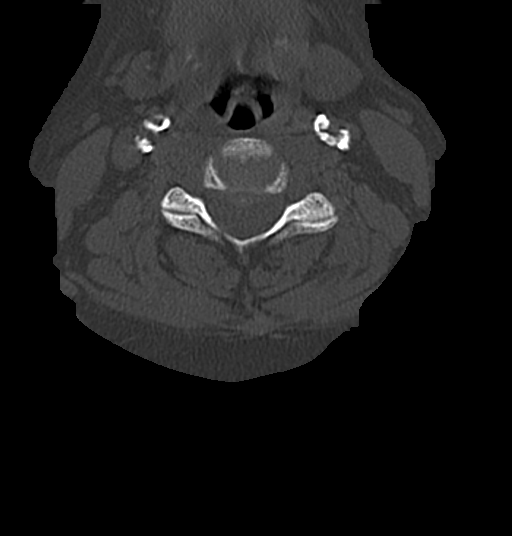
[im 93/109  bone]
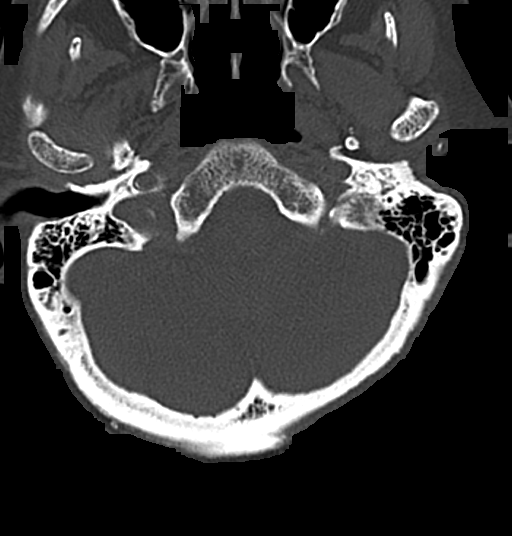

[12 of 33 positions shown; findings below may reference images not displayed]

FINDINGS: Alignment: Anteroposterior alignment is maintained.

Skull base and vertebrae: Vertebral body heights are preserved. No
acute cervical spine fracture.

Soft tissues and spinal canal: No prevertebral fluid or swelling. No
visible canal hematoma.

Disc levels: Multilevel degenerative changes are present including
disc space narrowing, endplate osteophytes, and facet and
uncovertebral hypertrophy. There is resulting up to moderate canal
stenosis. No high-grade osseous encroachment on the neural foramina.

Upper chest: No apical lung mass.

Other: Heavily calcified common carotid bifurcations.
IMPRESSION: No acute cervical spine fracture.

## 2021-10-07 IMAGING — CR DG LUMBAR SPINE COMPLETE 4+V
5 series · 5 of 5 positions shown · non-contrast
Comparison: CT abdomen/pelvis dated 10/30/2019

CLINICAL DATA: Fall

EXAM:
LUMBAR SPINE - COMPLETE 4+ VIEW

[t l-spine a.p.]
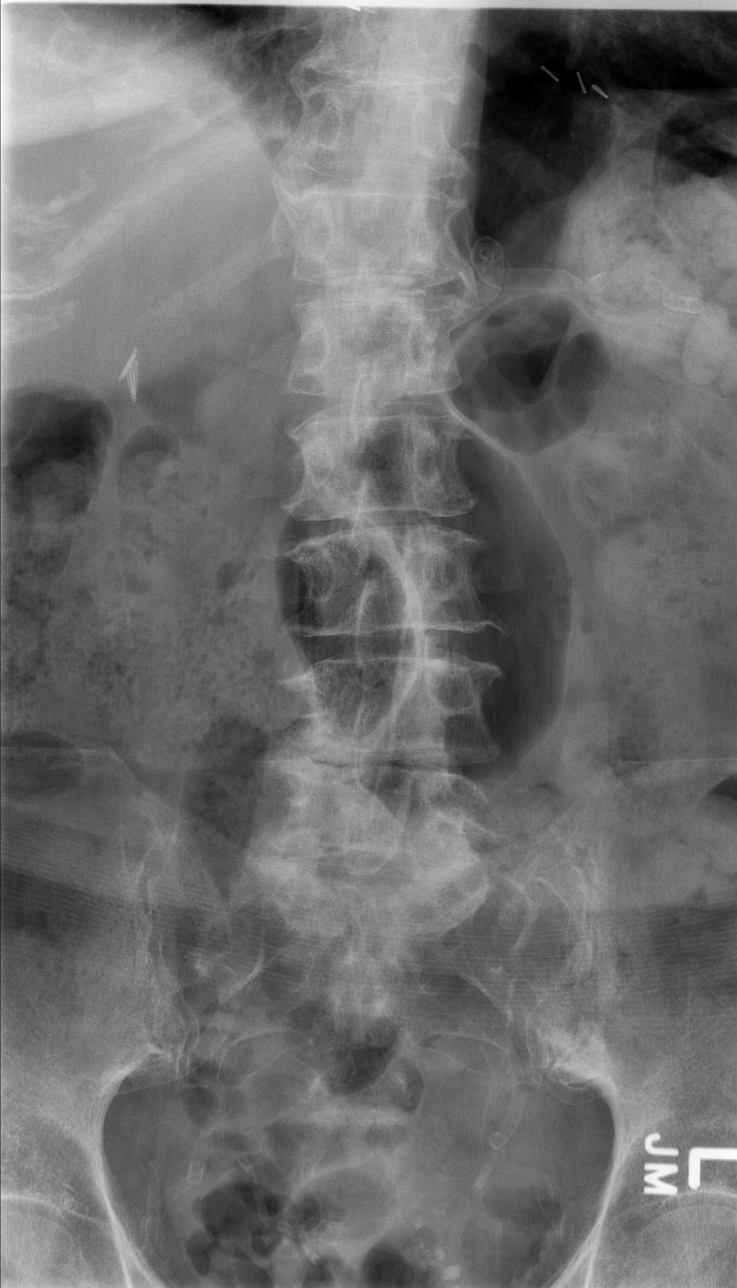

[t l-spine oblique exposure (1 of 2)]
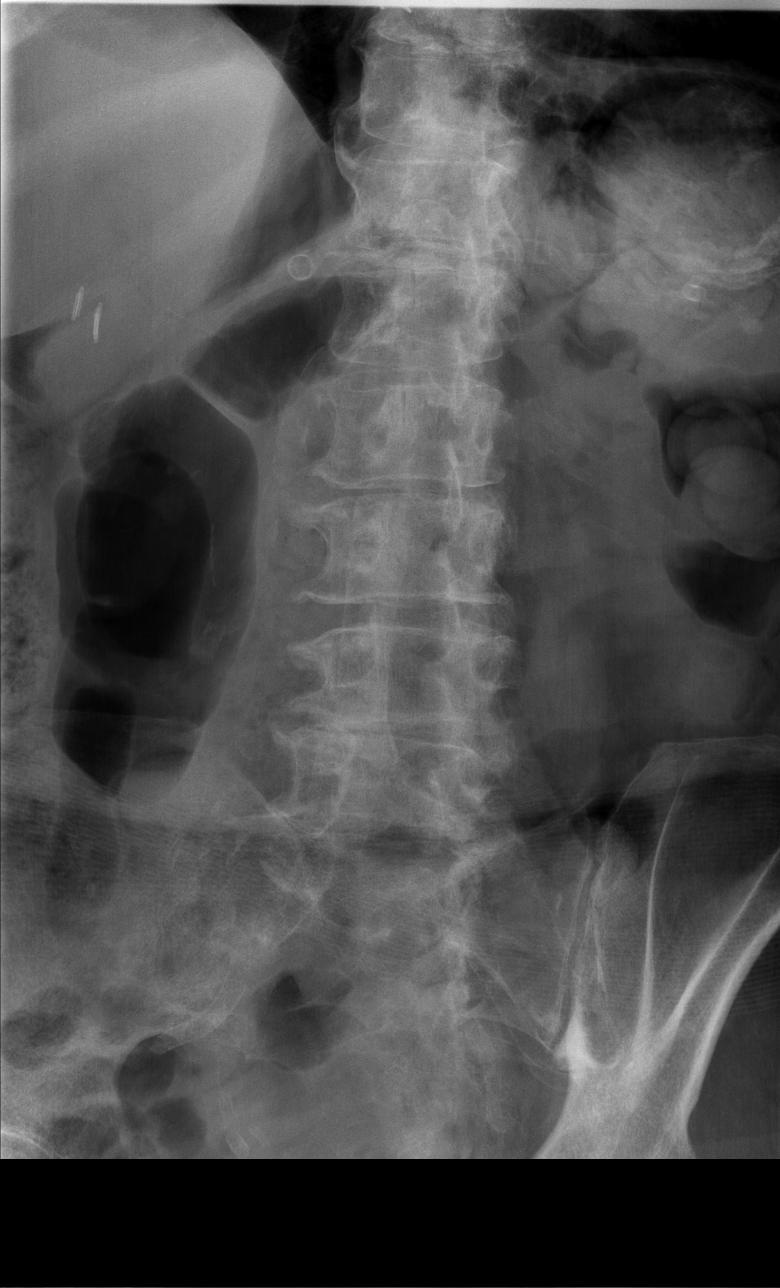

[t l-spine oblique exposure (2 of 2)]
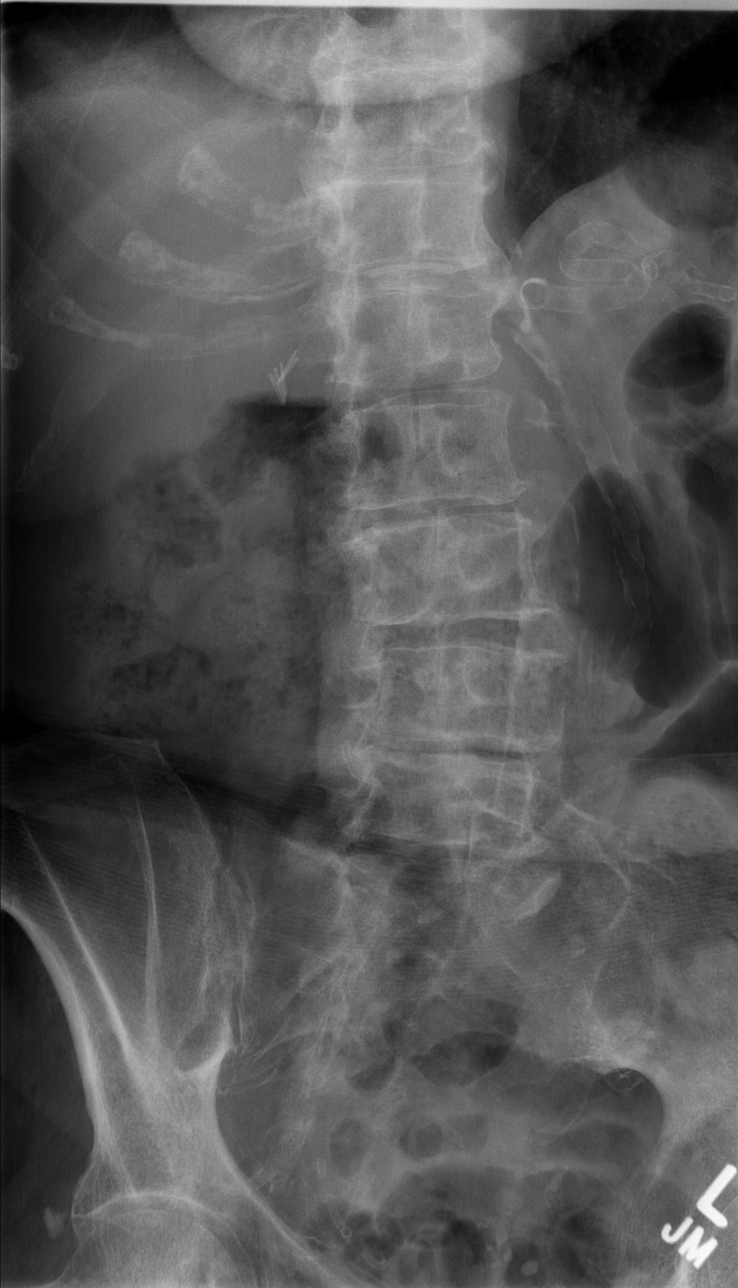

[t l-spine lat]
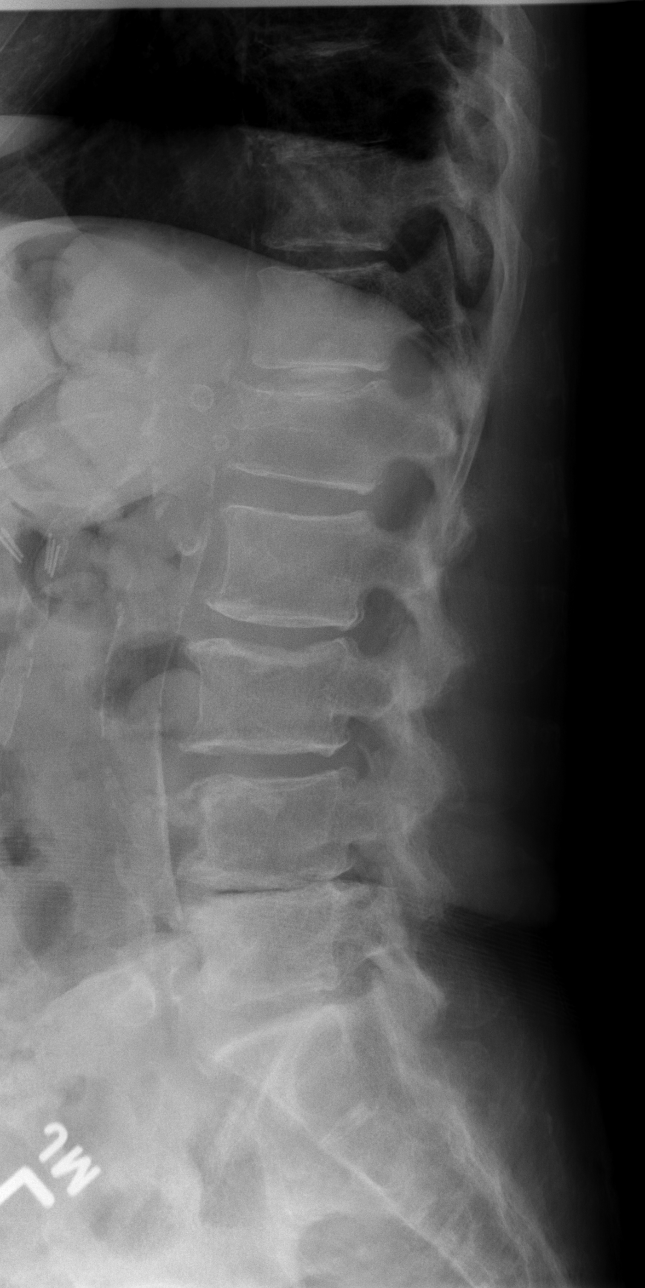

[t l-spine l5-s1 spot]
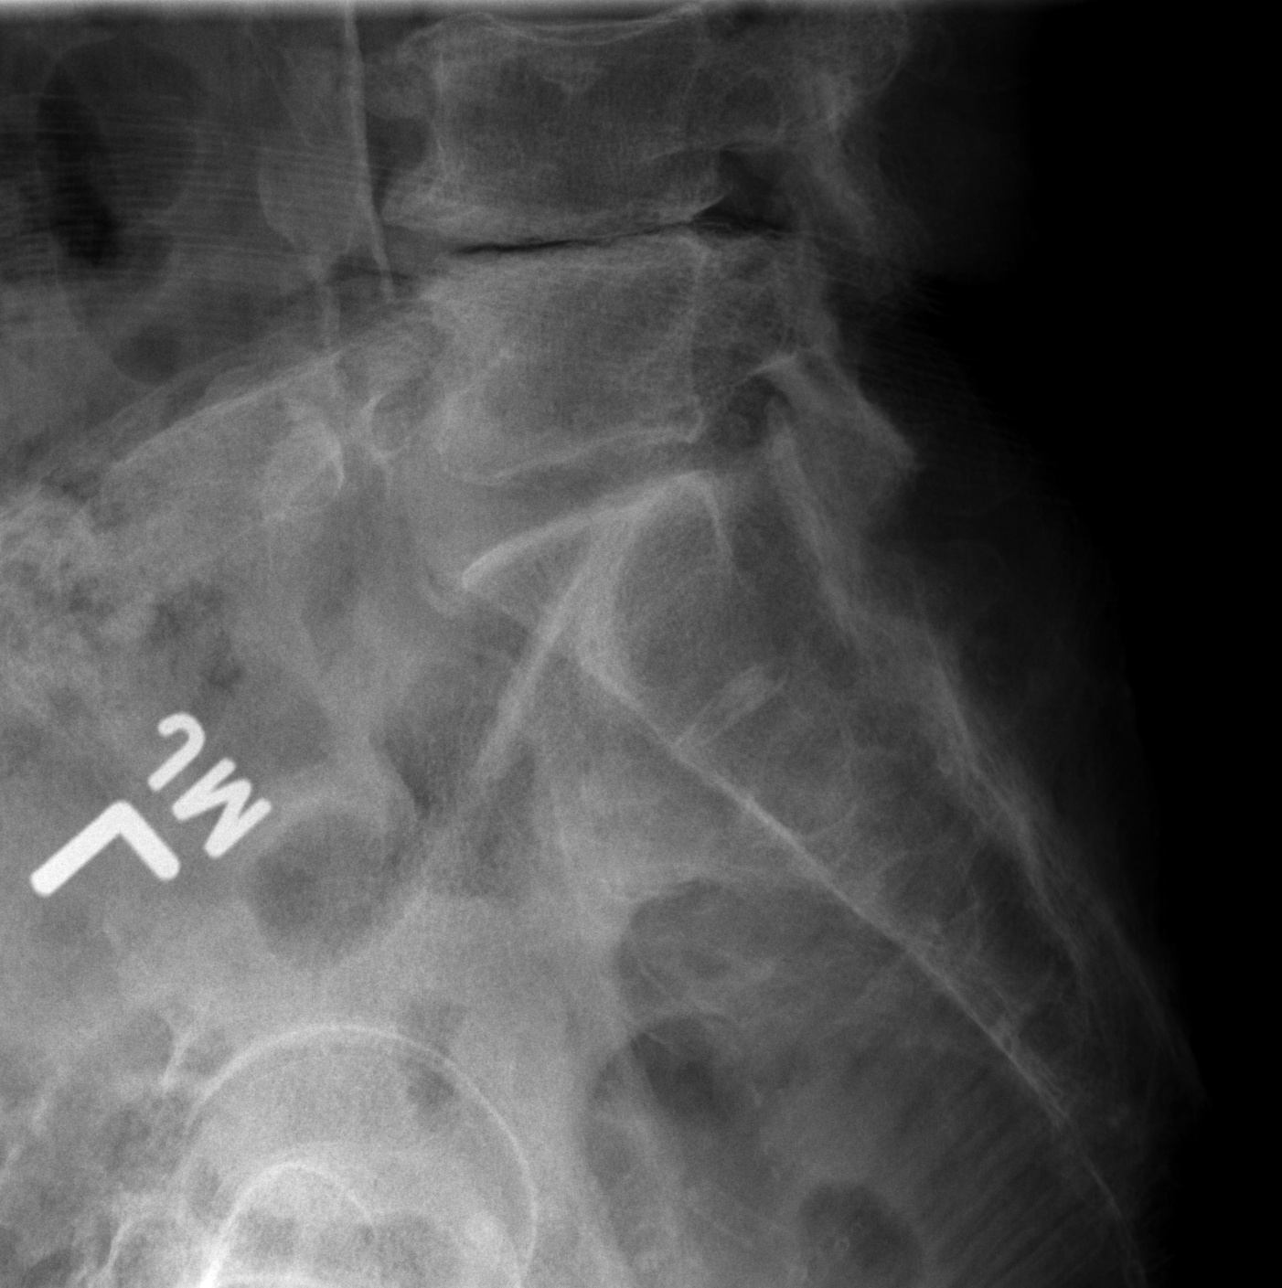

[5 of 5 positions shown; findings below may reference images not displayed]

FINDINGS: Five lumbar-type vertebral bodies.

Normal lumbar lordosis.

Mild superior endplate compression fracture deformity at L1, age
indeterminate, but new from prior CT. No retropulsion.

Mild superior/inferior endplate changes at L3, corresponding to
Schmorl's node changes on prior CT, chronic.

Mild to moderate degenerative changes of the lower lumbar spine.

Visualized bony pelvis appears intact.
IMPRESSION: Mild superior endplate compression fracture deformity at L1, age
indeterminate but new from prior CT. No retropulsion. Correlate for
point tenderness.

Mild to moderate degenerative changes of the lower lumbar spine.

## 2021-10-07 IMAGING — CR DG THORACIC SPINE 2V
3 series · 3 of 3 positions shown · non-contrast
Comparison: None.

CLINICAL DATA: Fall

EXAM:
THORACIC SPINE 2 VIEWS

[w t-spine a.p. *]
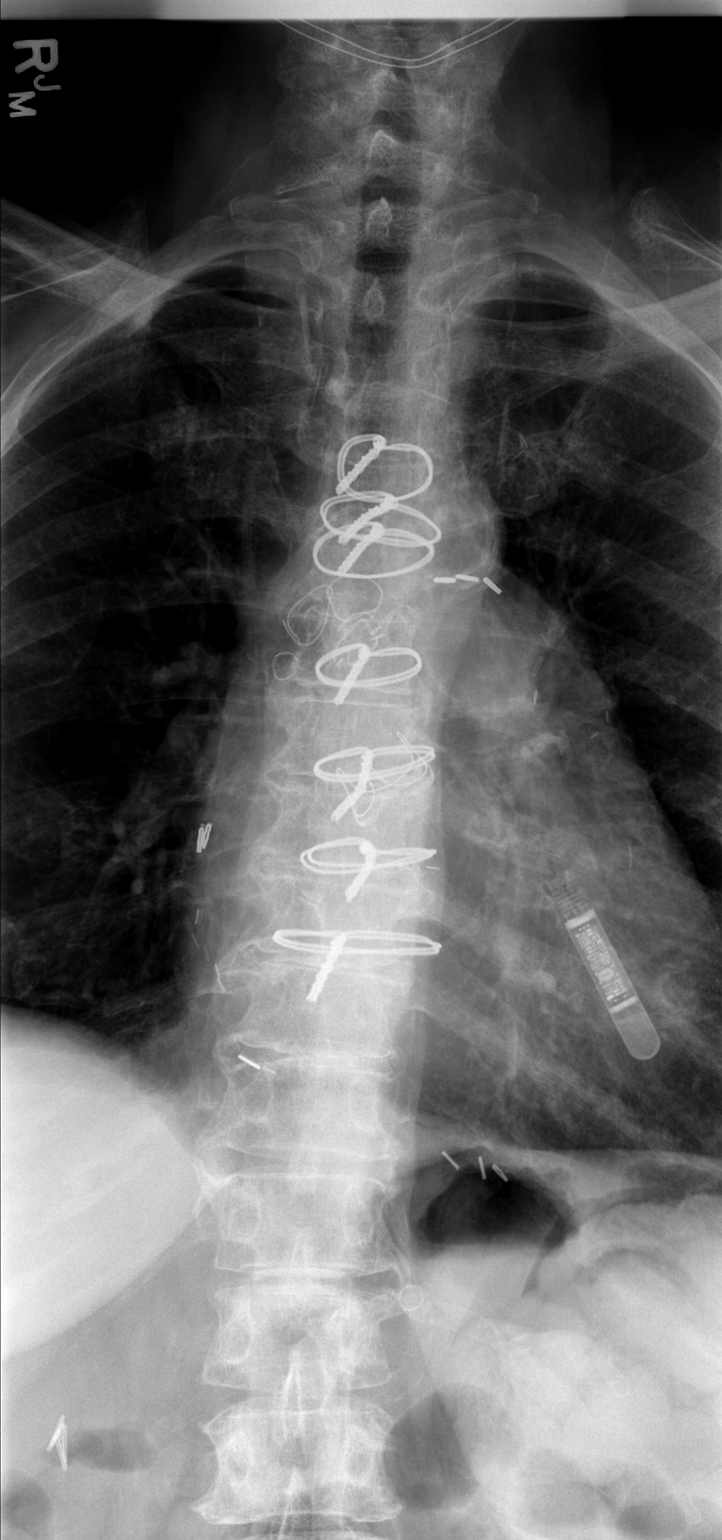

[w t-spine lat *]
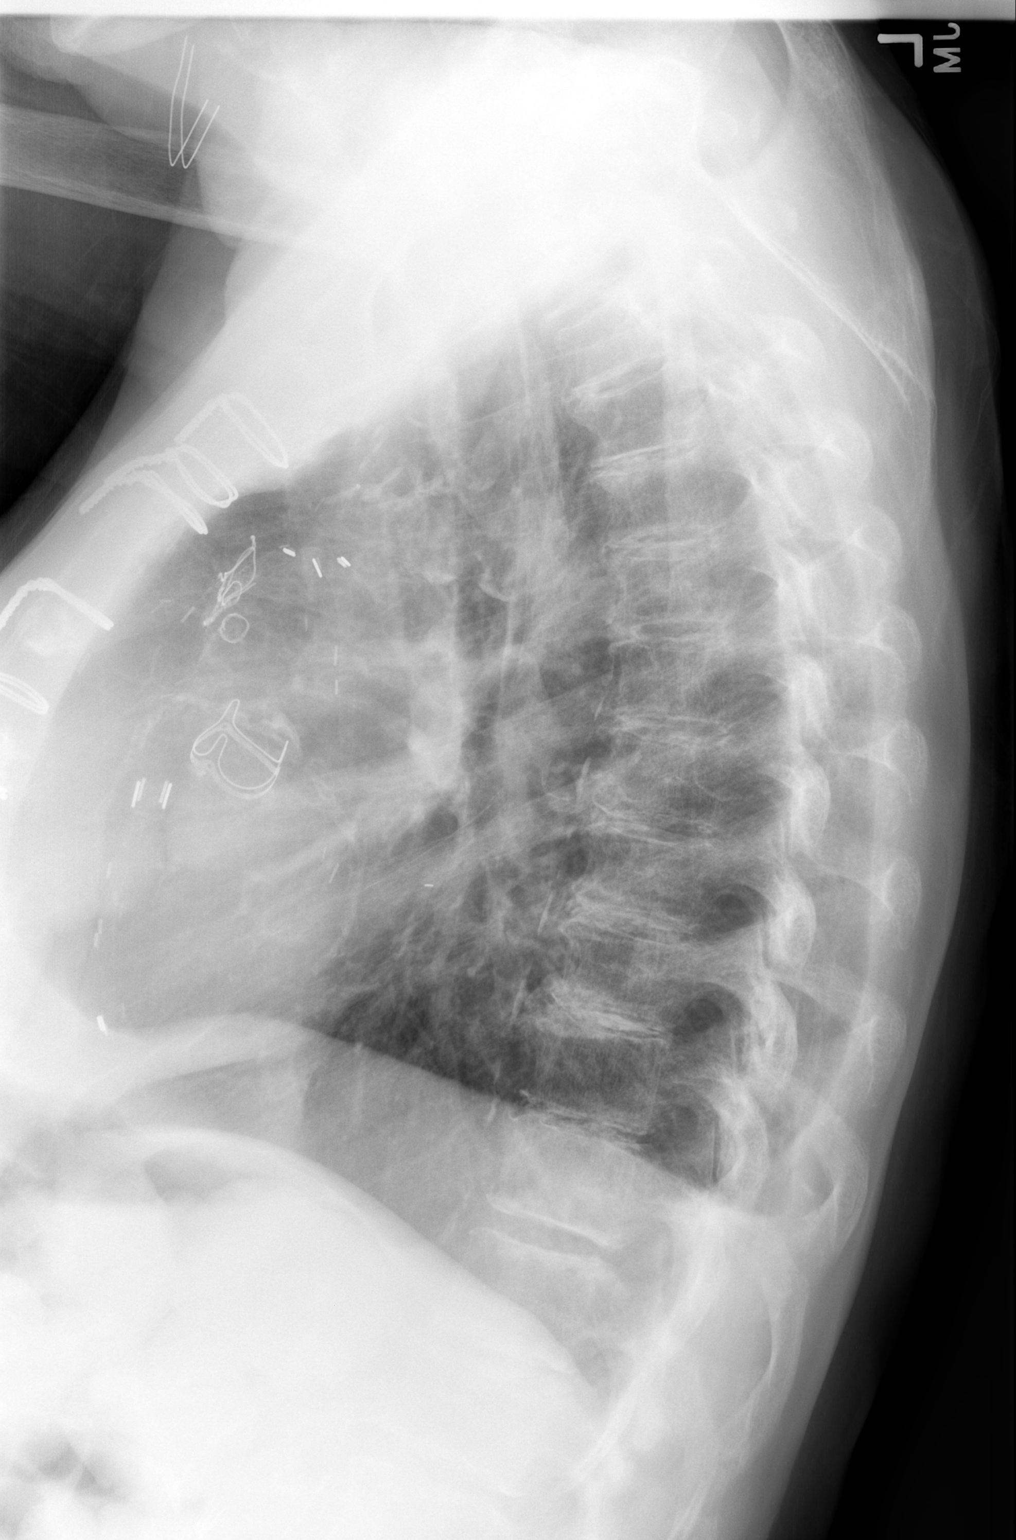

[w swimmers view]
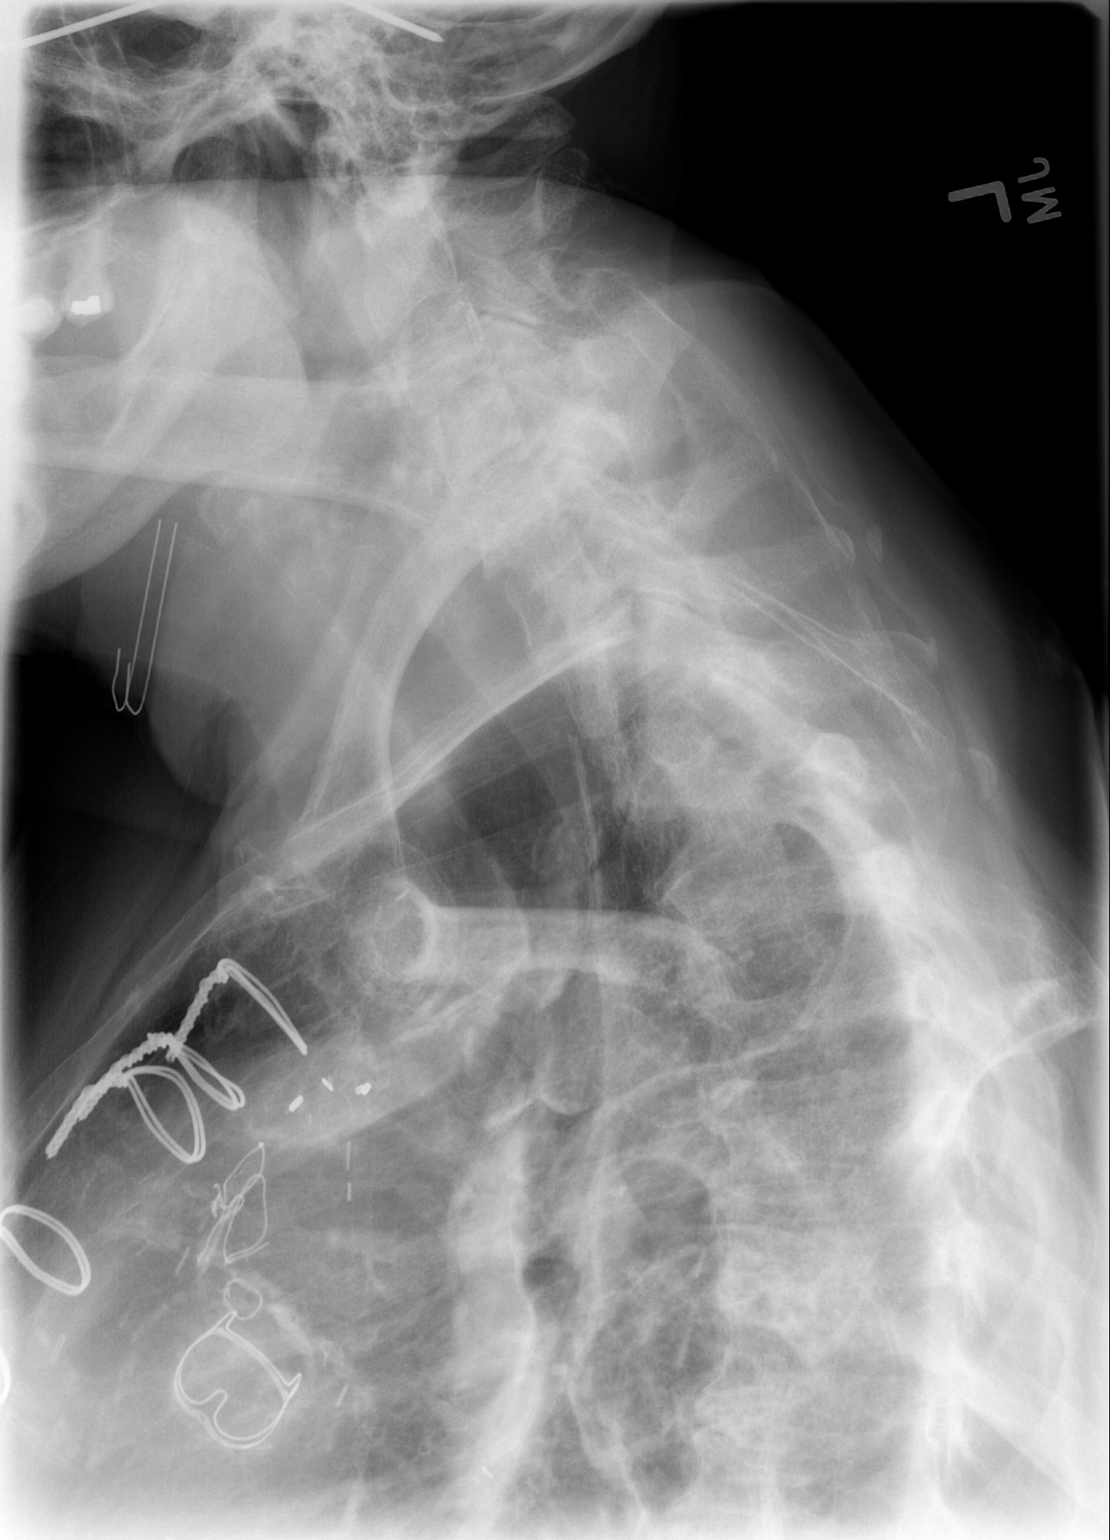

[3 of 3 positions shown; findings below may reference images not displayed]

FINDINGS: Normal thoracic kyphosis.

No fracture or dislocation is seen.

Mild degenerative changes of the thoracic spine.

Prosthetic aortic valve. Postsurgical changes related to prior CABG.

Median sternotomy.
IMPRESSION: Negative.

## 2021-10-15 IMAGING — MR MR LUMBAR SPINE W/O CM
5 series · 31 of 48 positions shown · non-contrast
Comparison: 01/20/2020.  CT 10/30/2019.

CLINICAL DATA: Low back pain. Compression fractures. Fell
01/19/2020.

EXAM:
MRI LUMBAR SPINE WITHOUT CONTRAST
TECHNIQUE: Multiplanar, multisequence MR imaging of the lumbar spine was
performed. No intravenous contrast was administered.

[Series 5: T1 · sagittal · 4.0mm · 0.72mm/px · 6 of 17 slices shown (1 of 2)]
[im 1/17]
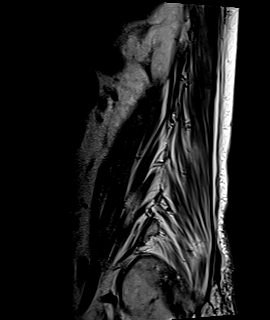
[im 4/17]
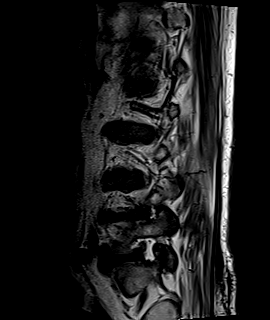
[im 7/17]
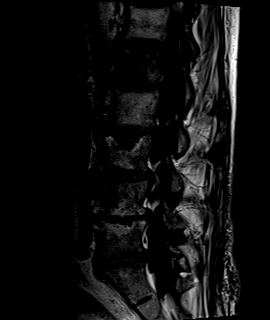
[im 10/17]
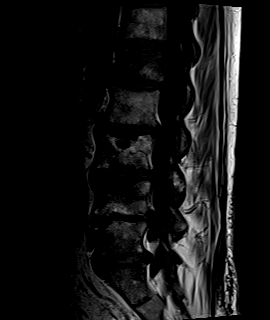
[im 13/17]
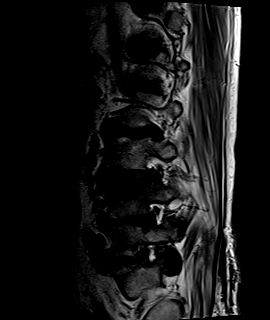
[im 17/17]
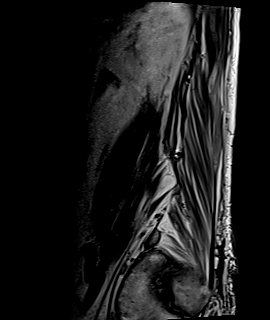

[Series 6: T2 · sagittal · 4.0mm · 0.72mm/px · 6 of 17 slices shown (1 of 2)]
[im 1/17]
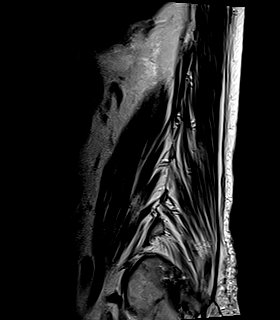
[im 4/17]
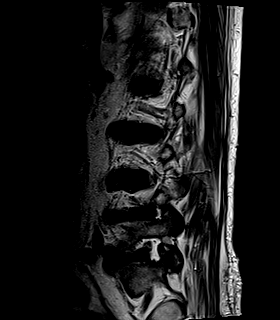
[im 7/17]
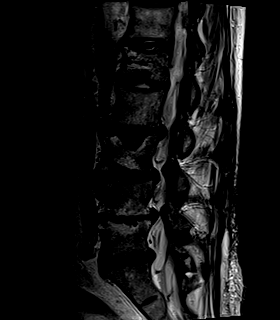
[im 10/17]
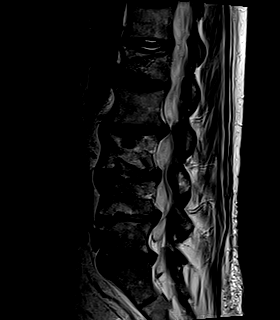
[im 13/17]
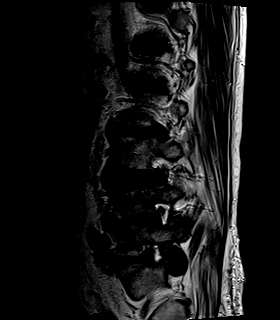
[im 17/17]
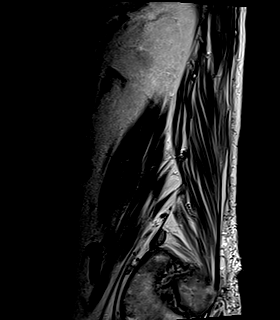

[Series 7: STIR · sagittal · 4.0mm · 0.45mm/px · 1 of 17 slices shown]
[im 1/17]
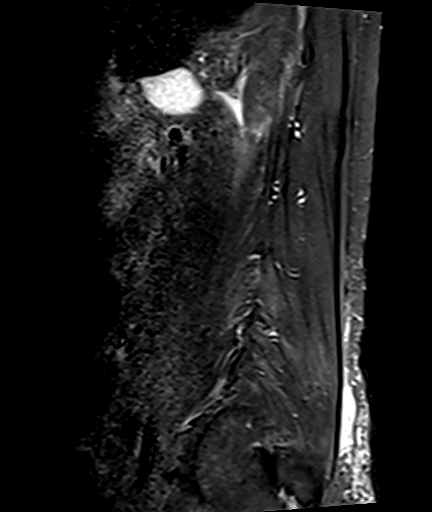

[Series 8: T2 · axial · 4.0mm · 0.62mm/px · z∈[-172,+19]mm · 9 of 39 slices shown (2 of 2)]
[im 1/39]
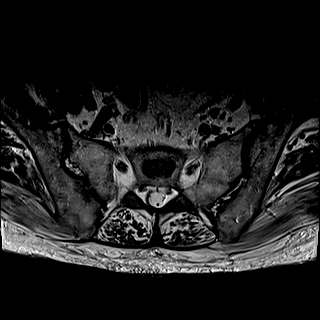
[im 6/39]
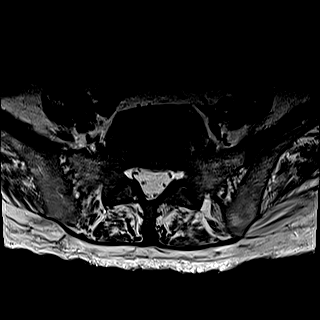
[im 11/39]
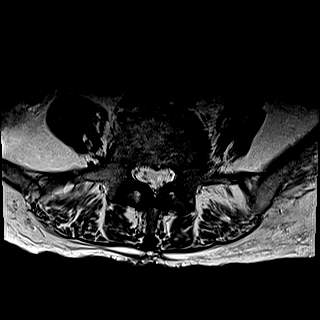
[im 17/39]
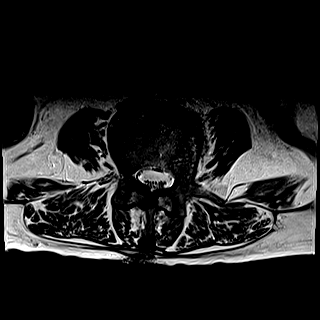
[im 20/39]
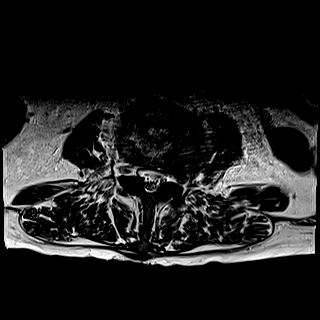
[im 22/39]
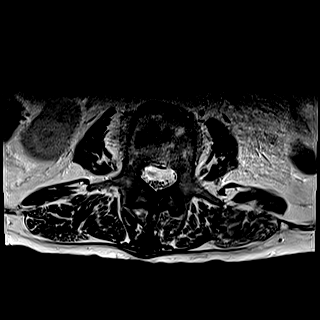
[im 28/39]
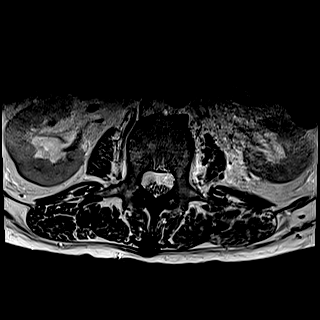
[im 33/39]
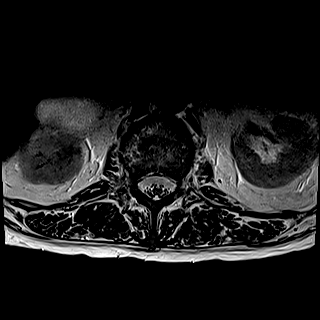
[im 39/39]
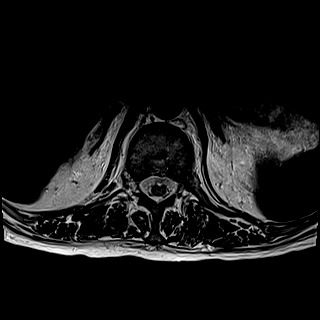

[Series 9: T1 · axial · 4.0mm · 0.39mm/px · z∈[-172,+19]mm · 9 of 39 slices shown (2 of 2)]
[im 1/39]
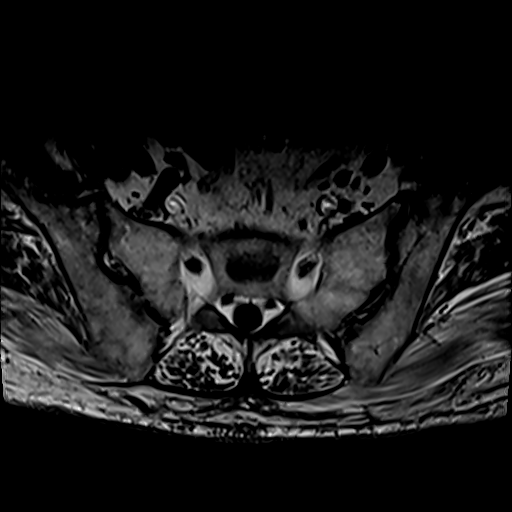
[im 6/39]
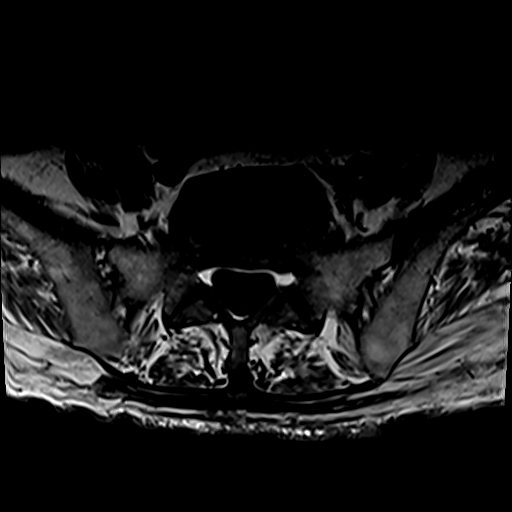
[im 11/39]
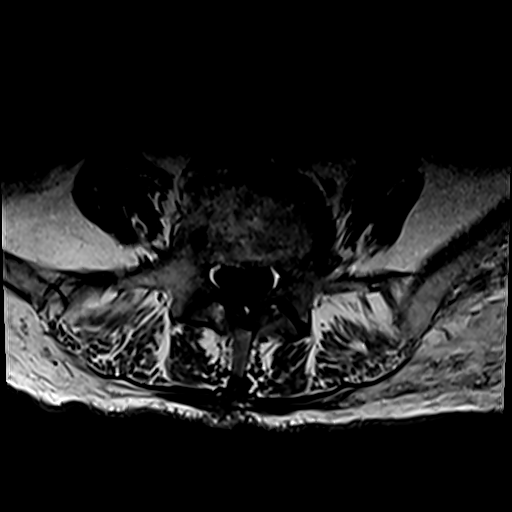
[im 17/39]
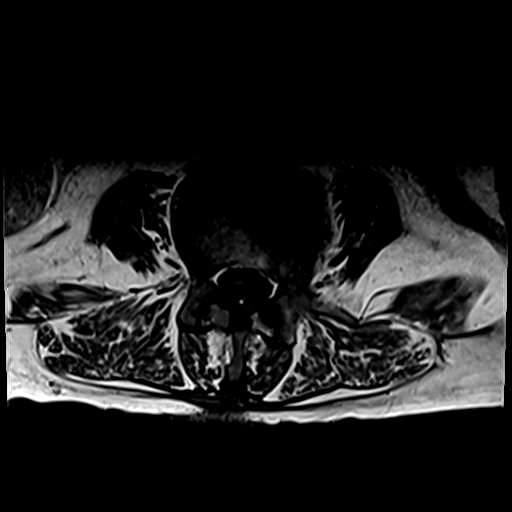
[im 20/39]
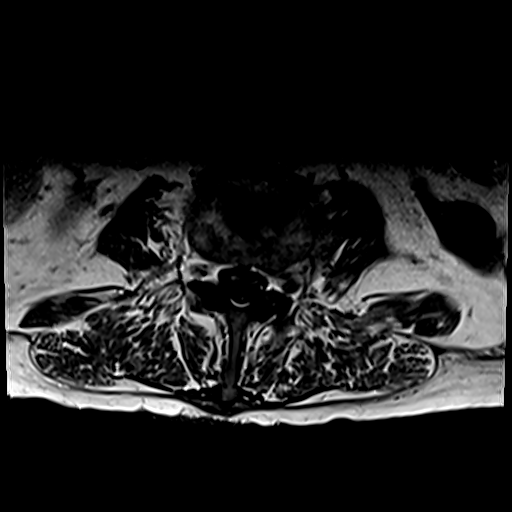
[im 22/39]
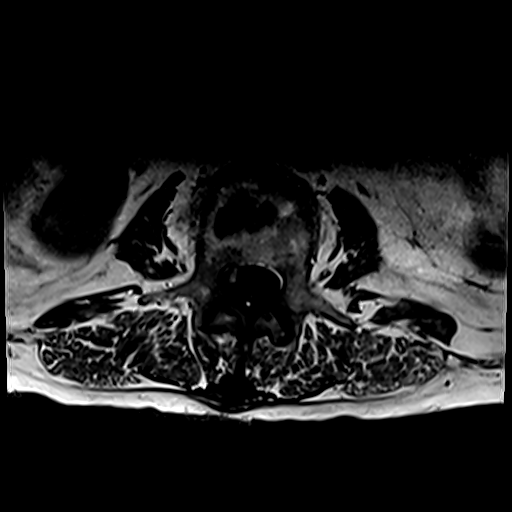
[im 28/39]
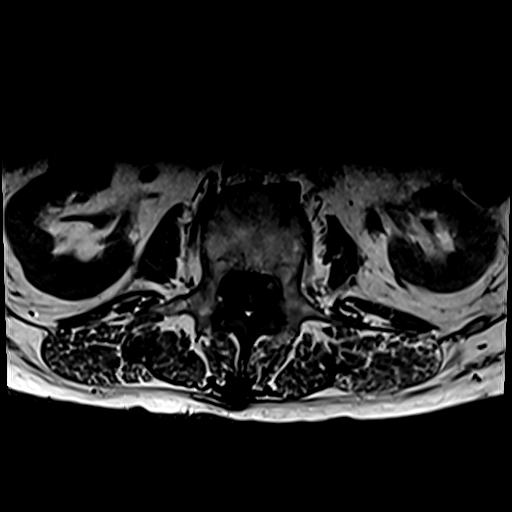
[im 33/39]
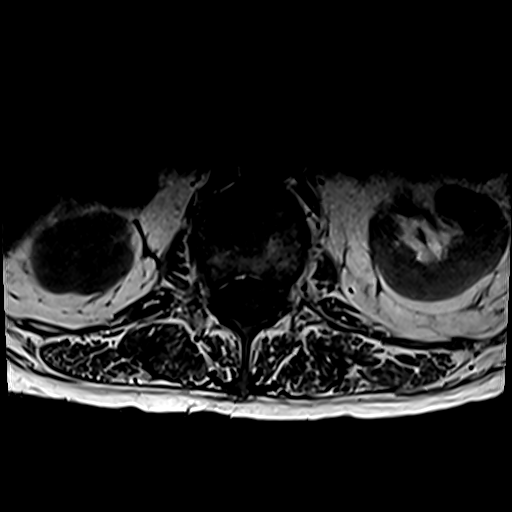
[im 39/39]
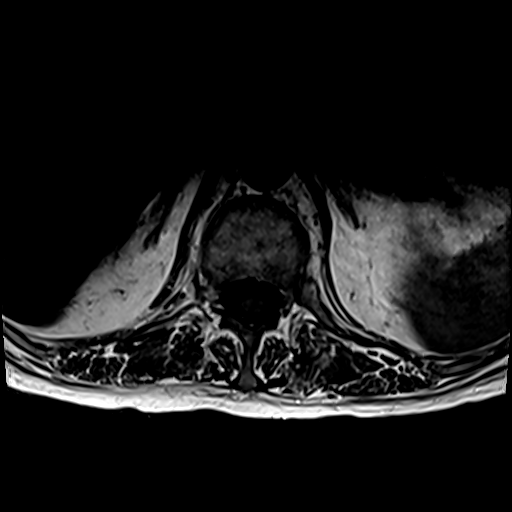

[31 of 48 positions shown; findings below may reference images not displayed]

FINDINGS: Segmentation:  5 lumbar type vertebral bodies.

Alignment:  Straightening of the normal lumbar lordosis.

Vertebrae: Acute or subacute compression fracture at L1 with loss of
height of 25%. No retropulsed bone. No finding to suggest that this
is anything other than a benign fracture. Old appearing superior and
inferior endplate Schmorl's nodes at L3. Some marrow edema
associated with the inferior endplate Schmorl's node, but not likely
to be related to a recent fracture. Mild edema at the superior
endplate of L4 on the left which could represent a minimal superior
endplate fracture at this level.

Conus medullaris and cauda equina: Conus extends to the L1-2 level.
Conus and cauda equina appear normal.

Paraspinal and other soft tissues: Negative

Disc levels:

Minimal non-compressive disc bulges at T12-L1 and L1-2.

L2-3: Broad-based disc herniation. Moderate stenosis at this level
that could possibly cause neural compression. This was probably
present on the CT from [REDACTED]

L3-4: Bulging of the disc. Facet and ligamentous hypertrophy. Mild
multifactorial stenosis which could possibly cause neural
compression in the lateral recesses.

L4-5: Endplate osteophytes and bulging of the disc. Facet and
ligamentous hypertrophy. Stenosis of both lateral recesses and of
the intervertebral foramen on the right that could possibly cause
neural compression.

L5-S1: Mild disc bulge.  No compressive stenosis.
IMPRESSION: Acute or subacute compression fracture at L1 with loss of height of
25%. No retropulsed bone. No finding to suggest that this represents
anything other than a benign osteoporotic fracture.

Superior endplate edema on the left at L4 that could possibly
represent a minor superior endplate fracture in this location, or
could be discogenic.

Old superior and inferior endplate Schmorl's nodes at L3. Some edema
associated with the inferior Schmorl's node, felt not to relate to
an acute injury.

L2-3: Broad-based disc herniation. Moderate stenosis that could
possibly cause neural compression. This was probably present at the
time of the CT study in Thursday October, 2019.

L3-4: Mild multifactorial stenosis. Potential for neural compression
in the lateral recesses.

L4-5: Bilateral lateral recess stenosis and intervertebral foraminal
stenosis on the right that could cause neural compression.

## 2021-10-30 IMAGING — XA IR KYPHO VERTEBRAL LUMBAR AUGMENTATION
1 series · 13 of 19 positions shown · non-contrast
Comparison: MRI lumbosacral spine January 28, 2020

INDICATION: Severe low back pain secondary to compression fracture at L1.

EXAM:
KYPHOPLASTY AT L1

[Series 300: spine · 13 of 19 slices shown]
[im 1/19]
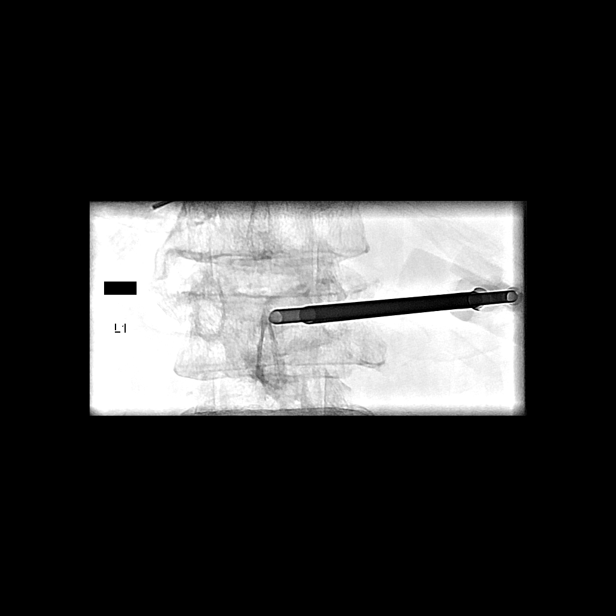
[im 3/19]
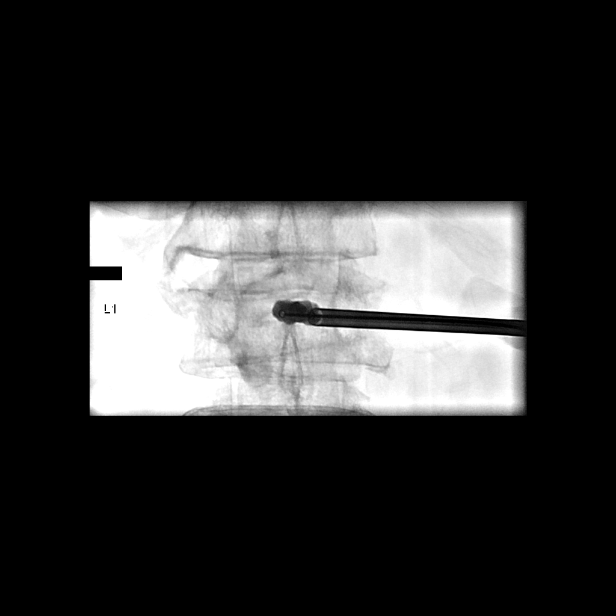
[im 4/19]
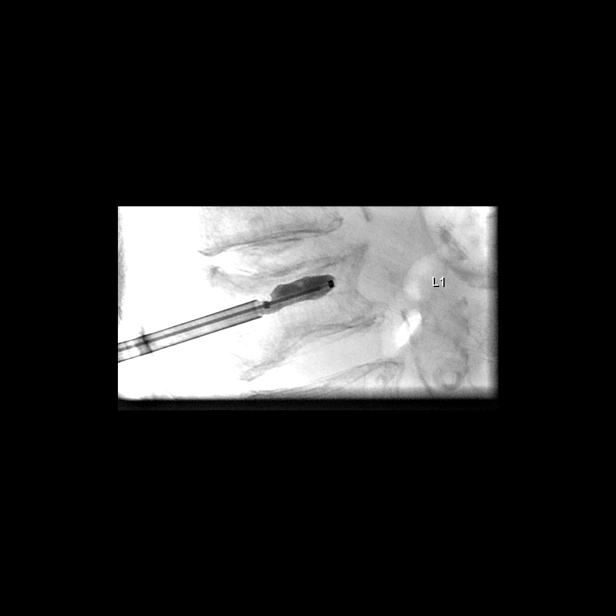
[im 6/19]
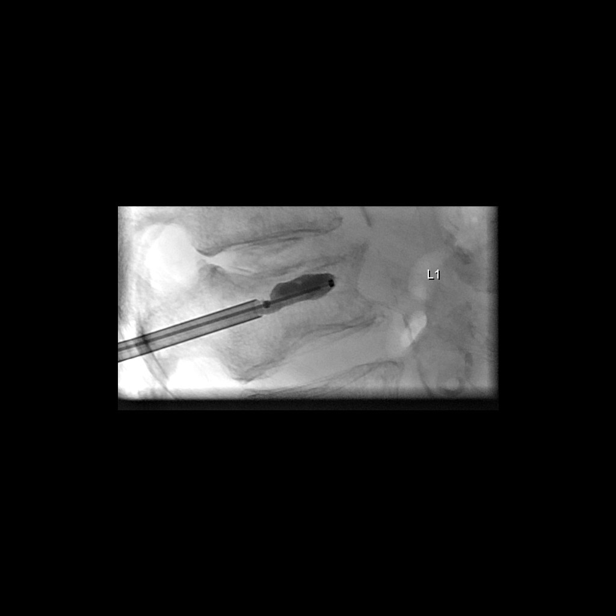
[im 7/19]
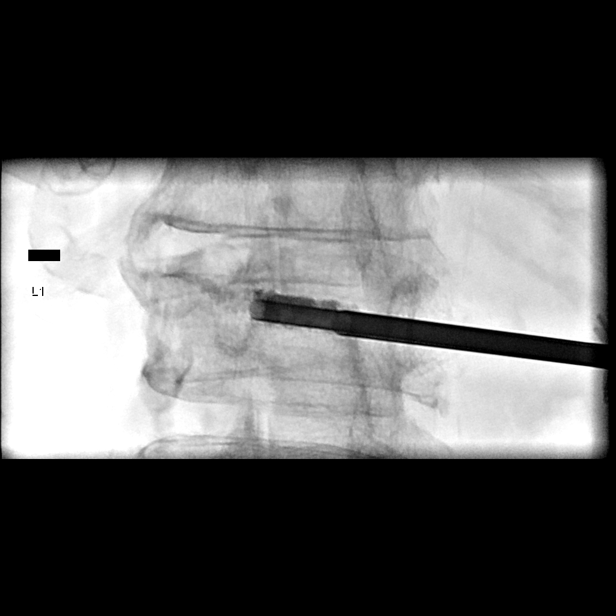
[im 9/19]
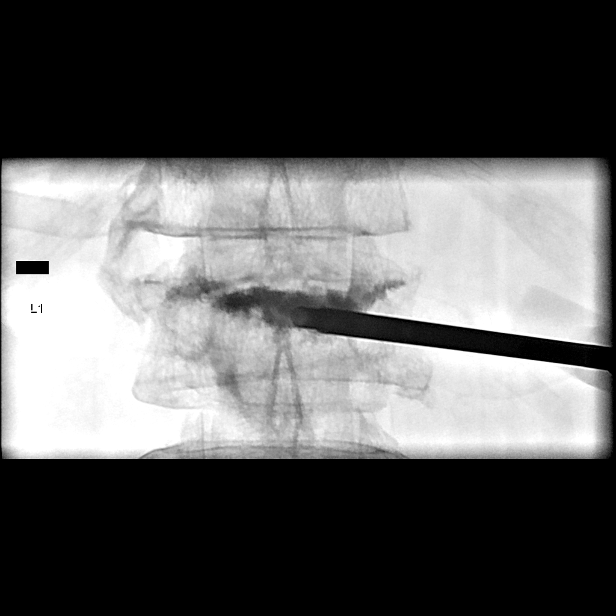
[im 10/19]
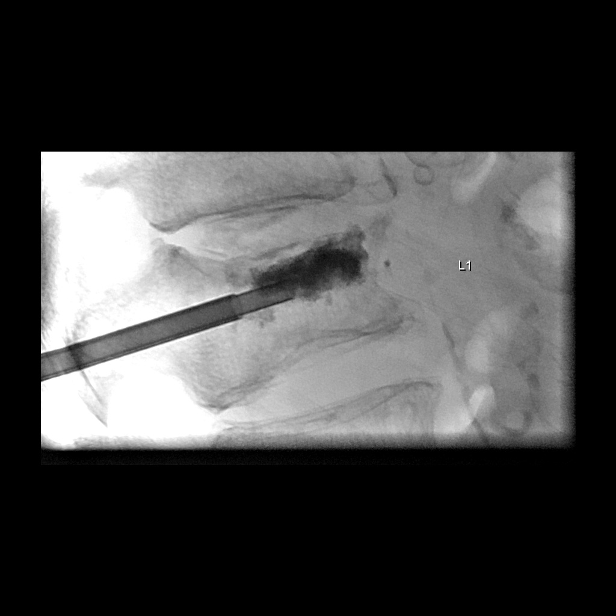
[im 11/19]
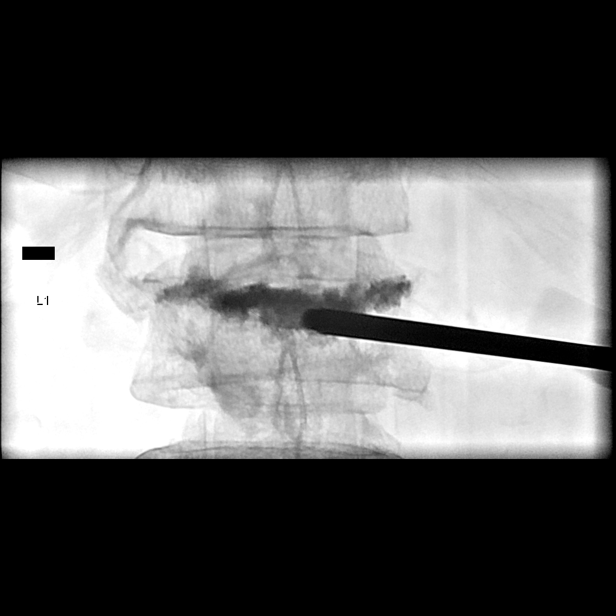
[im 13/19]
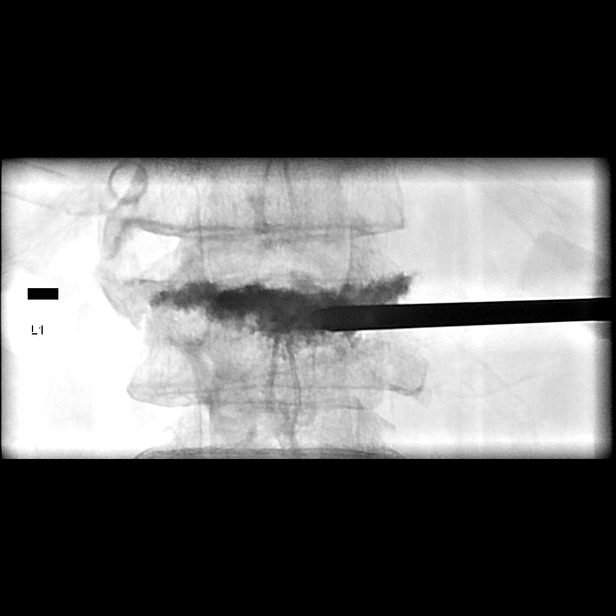
[im 14/19]
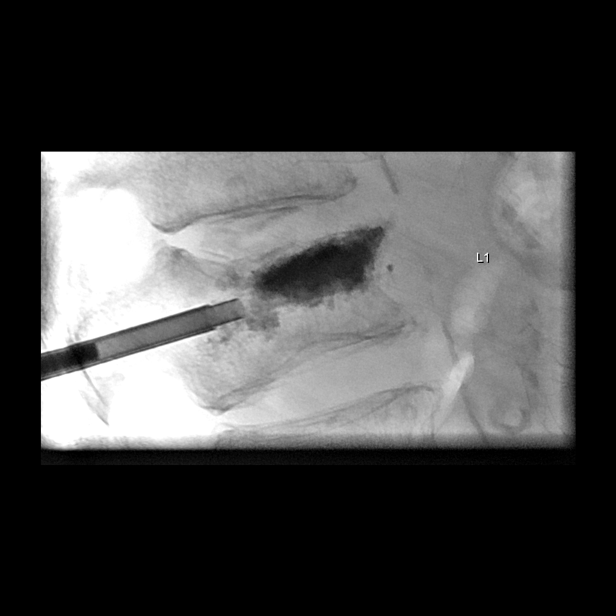
[im 16/19]
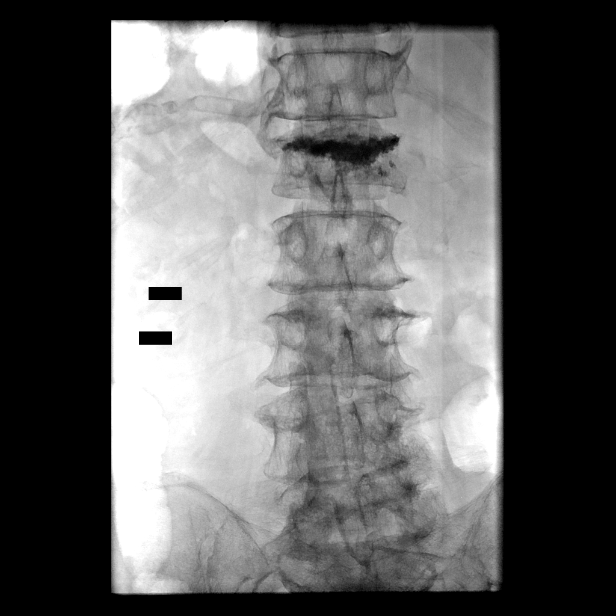
[im 17/19]
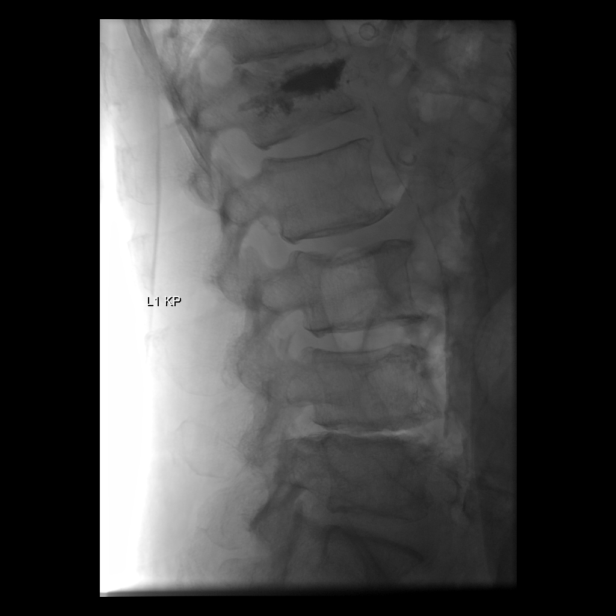
[im 19/19]
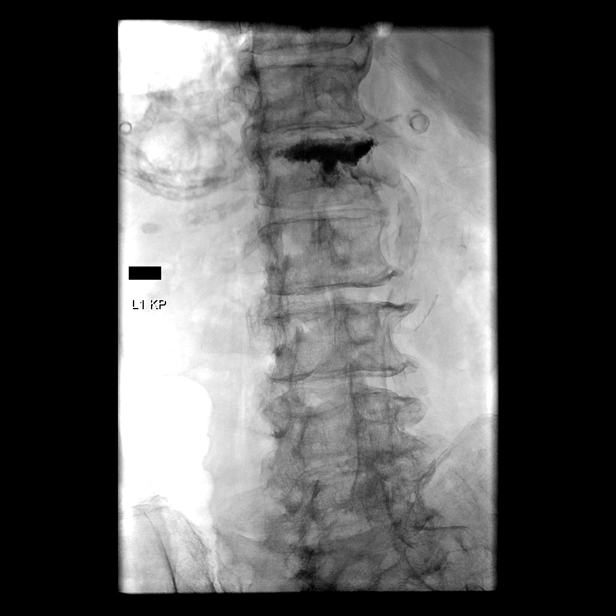

[13 of 19 positions shown; findings below may reference images not displayed]

MEDICATIONS:
As antibiotic prophylaxis, vancomycin 1 g IV was ordered
pre-procedure and administered intravenously within 1 hour of
incision.

ANESTHESIA/SEDATION:
Moderate (conscious) sedation was employed during this procedure. A
total of Versed 2 mg and Fentanyl 50 mcg was administered
intravenously.

Moderate Sedation Time: 26 minutes. The patient's level of
consciousness and vital signs were monitored continuously by
radiology nursing throughout the procedure under my direct
supervision.

FLUOROSCOPY TIME:  Fluoroscopy Time: 9 minutes 42 seconds (3252 mGy)

COMPLICATIONS:
None immediate.

PROCEDURE:
Following a full explanation of the procedure along with the
potential associated complications, an informed witnessed consent
was obtained.

The patient was placed prone on the fluoroscopic table. The skin
overlying the thoracolumbar region was then prepped and draped in
the usual sterile fashion. The right pedicle at L1 was then
infiltrated with 0.25% bupivacaine followed by the advancement of an
11-gauge Jamshidi needle through the right pedicle into the
posterior one-third at L1. This was then exchanged for a Kyphon
advanced osteo introducer system comprised of a working cannula and
a Kyphon osteo drill.

This combination was then advanced over a Kyphon osteo bone pin
until the tip of the Kyphon osteo drill was in the posterior third
at L1.

At this time, the bone pin was removed. In a medial trajectory, the
combination was advanced until the tip of the working cannula was
inside the posterior one-third at L1.

The osteo drill was removed and a core sample sent for pathologic
analysis.

Through the working cannula, a Kyphon bone biopsy device was
advanced to within 5 mm of the anterior aspect of L1. A core sample
from this was also sent for pathologic analysis.

Through the working cannula, a Kyphon inflatable bone tamp 20 x 3
was advanced and positioned with the distal marker 5 mm from the
anterior aspect of L1. Crossing of the midline was seen on the AP
projection. At this time, the balloon was expanded using contrast
via a Kyphon inflation syringe device via microtubing.

Inflations were continued until there was apposition with the
superior endplate.

At this time, methylmethacrylate mixture was reconstituted with
Tobramycin in the Kyphon bone mixing device system. This was then
loaded onto the Kyphon bone fillers.

The balloon was deflated and removed followed by the instillation of
4 bone filler equivalents of methylmethacrylate mixture at L1 with
excellent filling in the AP and lateral projections. No
extravasation was noted in the disk spaces or posteriorly into the
spinal canal. No epidural venous contamination was seen.

The working cannula and the bone filler were then retrieved and
removed. Hemostasis was achieved at the skin entry site.
IMPRESSION: 1. Status post fluoroscopic-guided needle placement for deep core
bone biopsy at L1.

2. Status post vertebral body augmentation using balloon kyphoplasty
at L1 as described without event.
3. Patient and her husband were advised to call referring [facility] to
obtain results of the biopsy. They expressed understanding and
agreement with the above management plan.

## 2021-11-11 IMAGING — MG DIGITAL DIAGNOSTIC BILAT W/ TOMO W/ CAD
6 of 9 series · 6 of 25 positions shown · non-contrast
Comparison: Previous exam(s).

CLINICAL DATA: 74-year-old female with history of right breast
cancer status post lumpectomy in 4109. Patient complains of
retraction of the right breast at the lumpectomy site as well as a
palpable lump. Patient underwent a right breast biopsy and 2
biopsies of superficial masses in the right axillary region on
February 12, 2019 which all showed fat necrosis. Patient states that
she is to be scheduled for a breast MRI.

EXAM:
DIGITAL DIAGNOSTIC BILATERAL MAMMOGRAM WITH CAD AND TOMO
ULTRASOUND RIGHT BREAST

[R MLO]
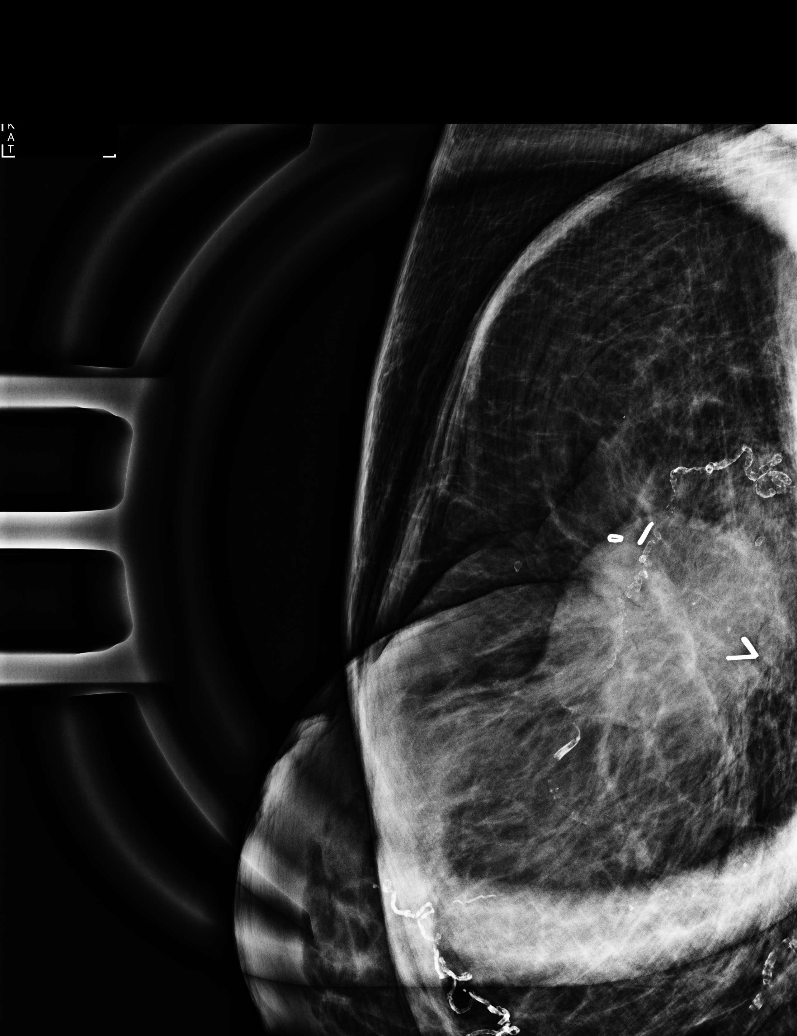

[R MLO synth-2D]
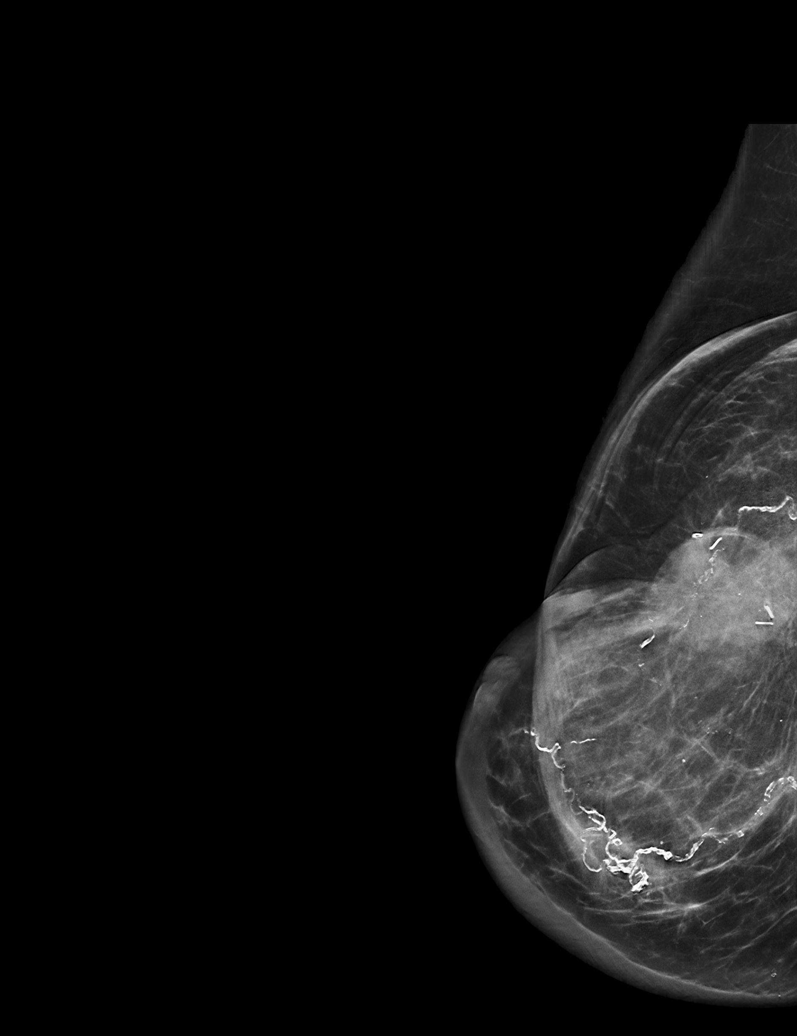

[L CC synth-2D]
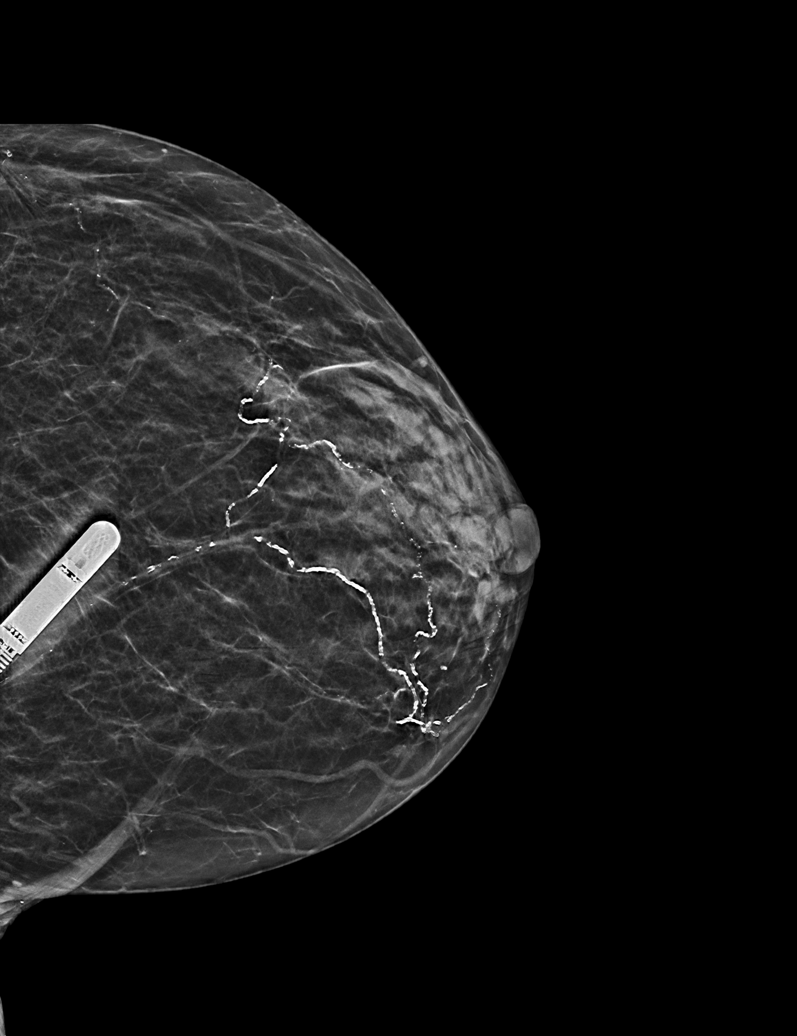

[R CC synth-2D]
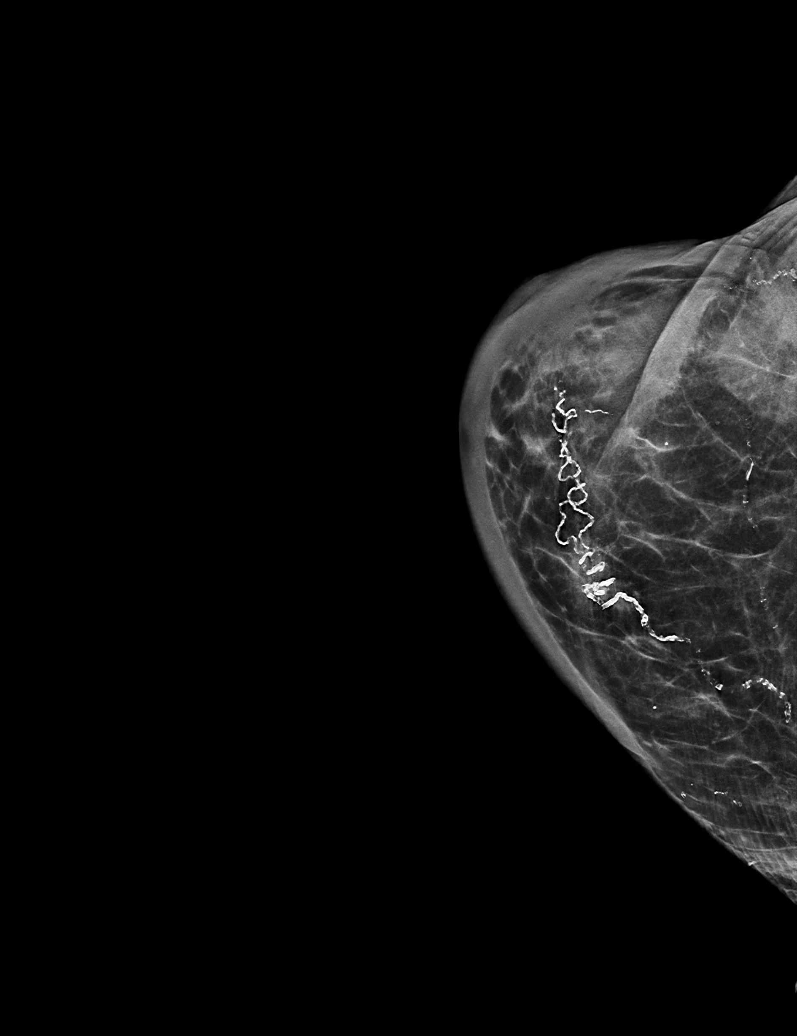

[L MLO synth-2D]
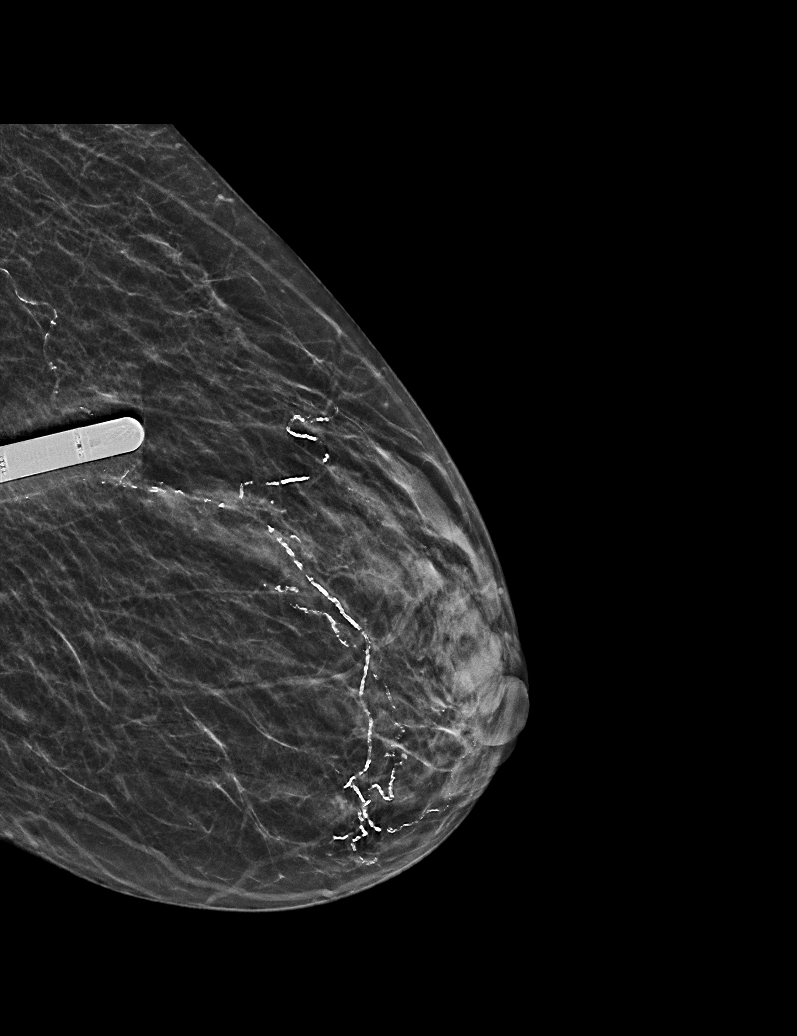

[L CC tomo · tomo slice 17/32.0]
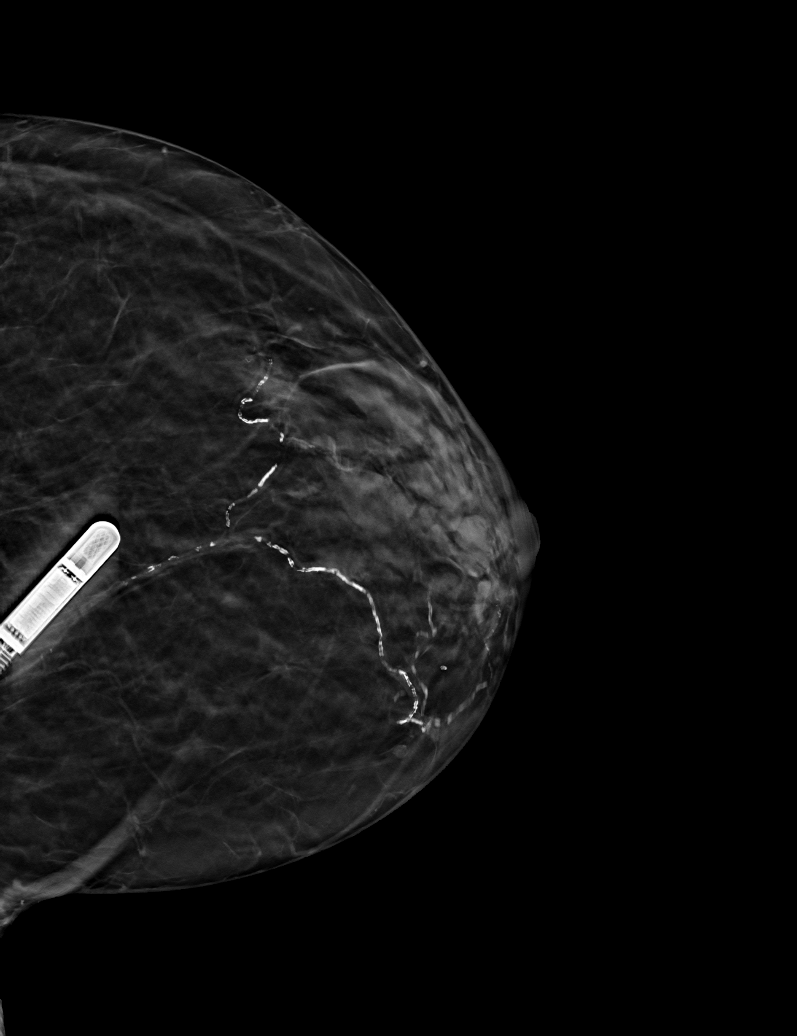

[6 of 25 positions shown; findings below may reference images not displayed]

ACR Breast Density Category c: The breast tissue is heterogeneously
dense, which may obscure small masses.
FINDINGS: No suspicious mass or malignant type microcalcifications identified
in the left breast.

The patient's right breast is firm and the best images were
obtained. The right breast is retracted and developing mass in the
upper-outer quadrant of the breast. There are no malignant type
microcalcifications.

Mammographic images were processed with CAD.

On physical exam, the right breast is very firm to palpation and
erythematous. I palpate a discrete mass in the right breast at 10
o'clock 3 cm from the nipple.

Targeted ultrasound is performed, showing a hypoechoic mass in the
right breast at 10 o'clock 3 cm from the nipple measuring 2.9 x
x 2.8 cm. Sonographic evaluation the right axilla does not show any
enlarged adenopathy.
IMPRESSION: Indeterminate mass in the 10 o'clock region of the right breast.
This may be fat necrosis/oil cyst but malignancy cannot be excluded.

RECOMMENDATION:
Ultrasound-guided core biopsy in the mass in the 10 o'clock region
of the right breast is recommended. The patient states that she is
going to be scheduled for breast MRI. The ultrasound-guided core
biopsy can after the patient's breast MRI.

I have discussed the findings and recommendations with the patient.
If applicable, a reminder letter will be sent to the patient
regarding the next appointment.

BI-RADS CATEGORY  4: Suspicious.

## 2021-11-11 IMAGING — US US BREAST*R* LIMITED INC AXILLA
1 series · 12 of 12 positions shown · non-contrast
Comparison: Previous exam(s).

CLINICAL DATA: 74-year-old female with history of right breast
cancer status post lumpectomy in 4109. Patient complains of
retraction of the right breast at the lumpectomy site as well as a
palpable lump. Patient underwent a right breast biopsy and 2
biopsies of superficial masses in the right axillary region on
February 12, 2019 which all showed fat necrosis. Patient states that
she is to be scheduled for a breast MRI.

EXAM:
DIGITAL DIAGNOSTIC BILATERAL MAMMOGRAM WITH CAD AND TOMO
ULTRASOUND RIGHT BREAST

[Series 1: us breast*right* limited inc axilla · 0.08mm/px · 12 of 12 slices shown]
[im 1/12]
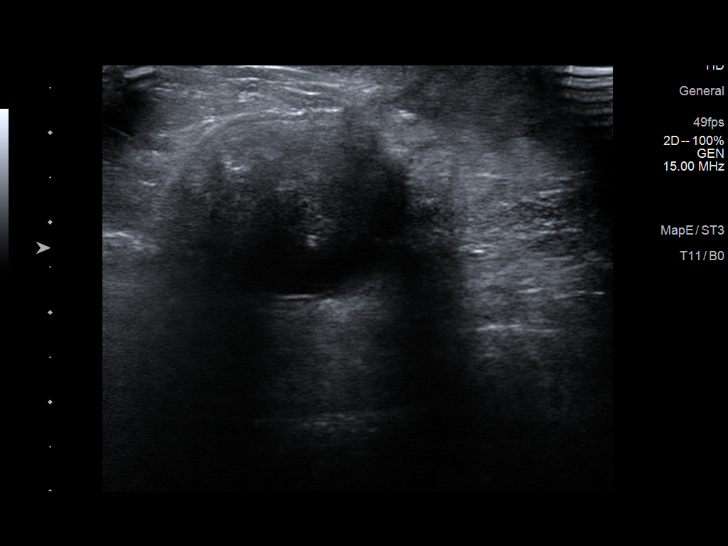
[im 2/12]
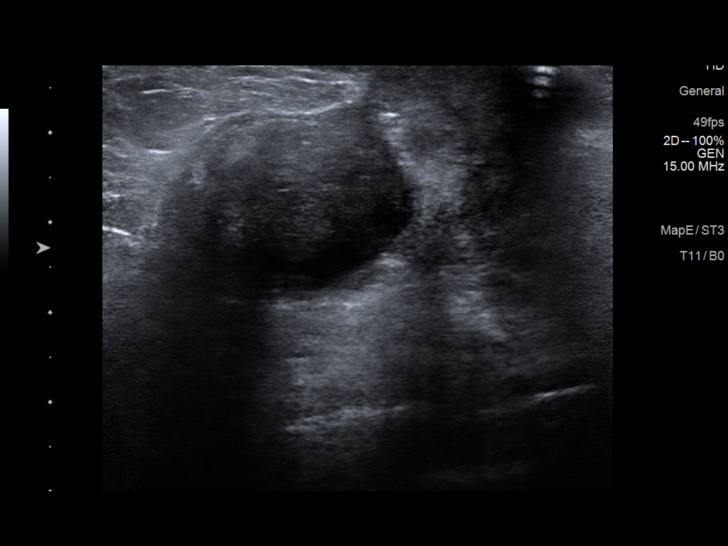
[im 3/12]
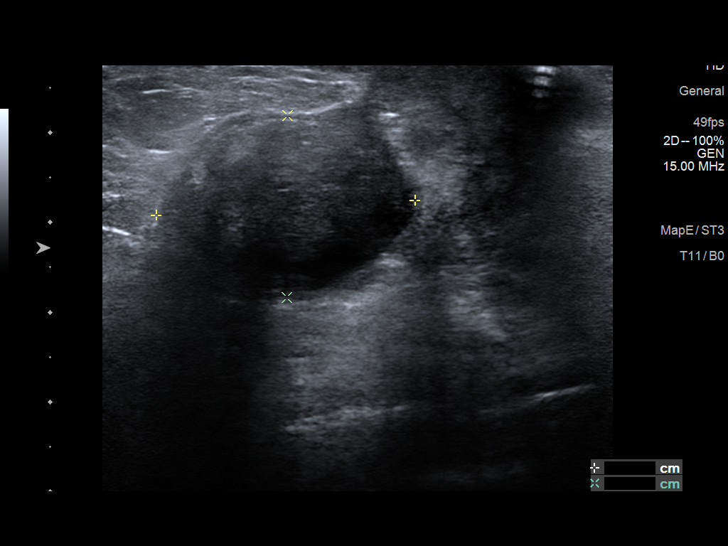
[im 4/12]
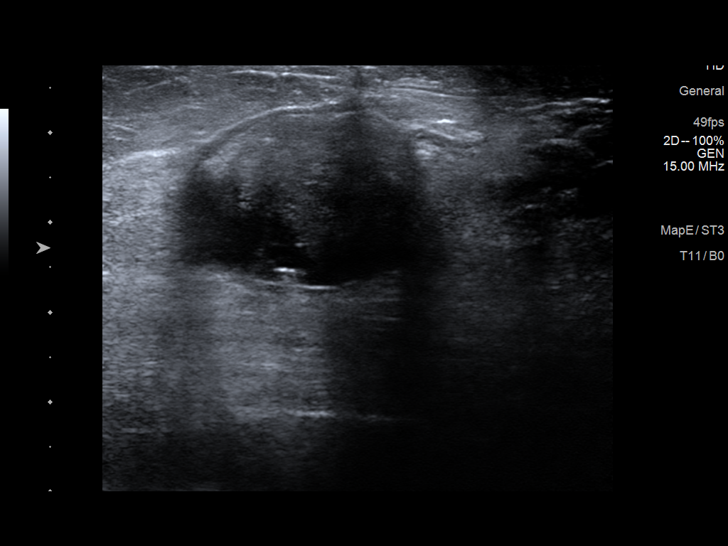
[im 5/12]
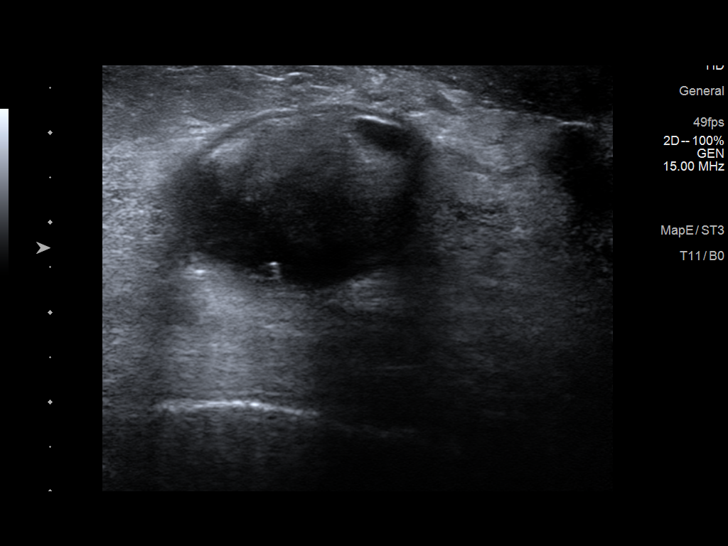
[im 6/12]
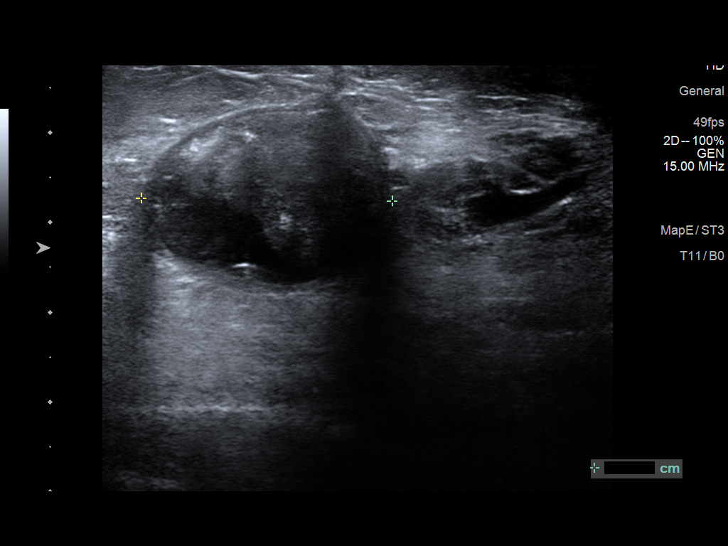
[im 7/12]
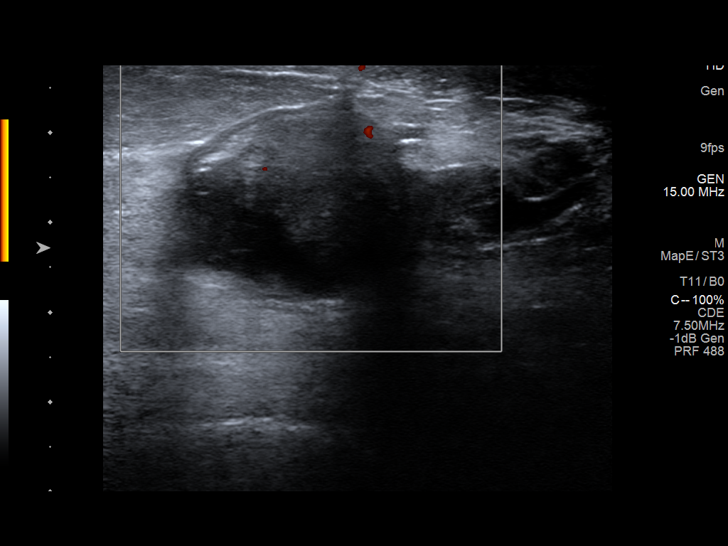
[im 8/12]
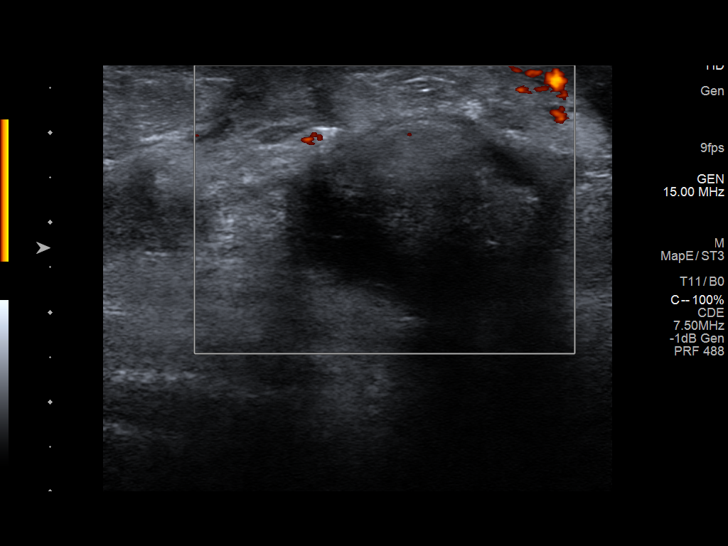
[im 9/12]
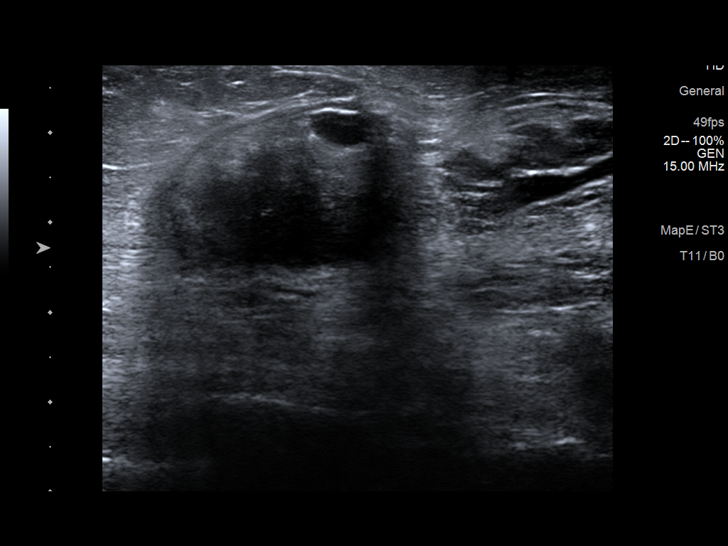
[im 10/12]
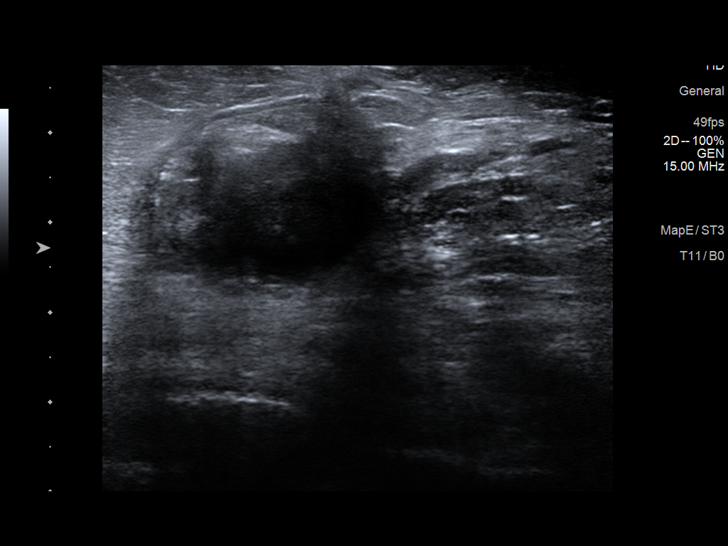
[im 11/12]
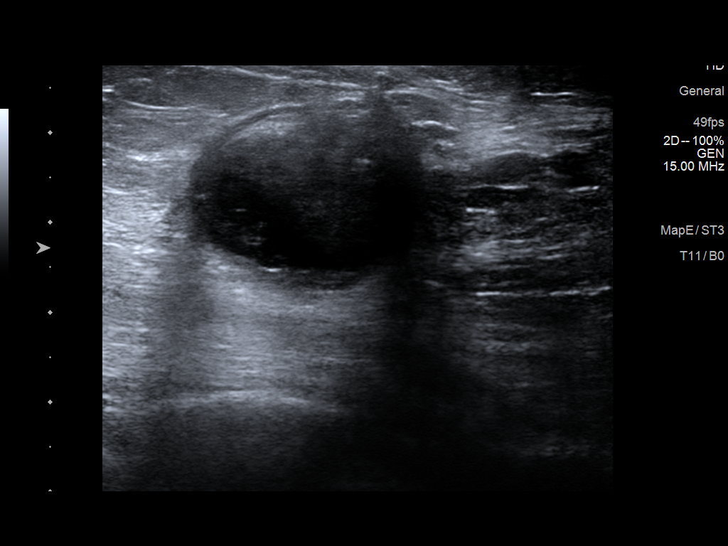
[im 12/12]
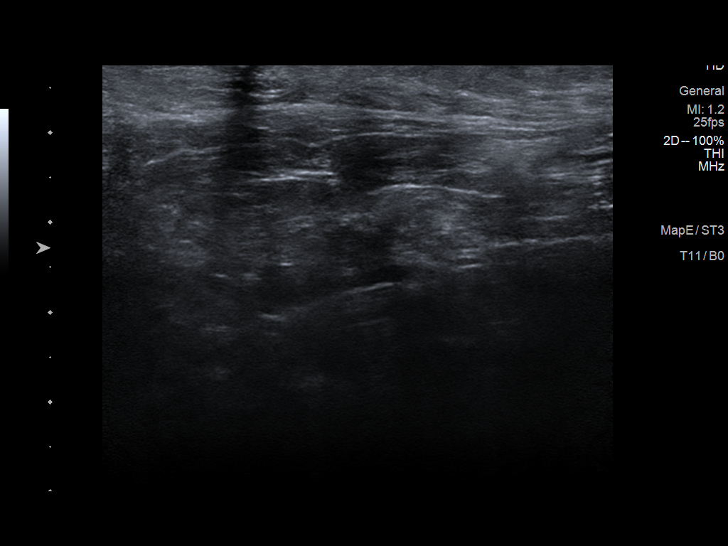

[12 of 12 positions shown; findings below may reference images not displayed]

ACR Breast Density Category c: The breast tissue is heterogeneously
dense, which may obscure small masses.
FINDINGS: No suspicious mass or malignant type microcalcifications identified
in the left breast.

The patient's right breast is firm and the best images were
obtained. The right breast is retracted and developing mass in the
upper-outer quadrant of the breast. There are no malignant type
microcalcifications.

Mammographic images were processed with CAD.

On physical exam, the right breast is very firm to palpation and
erythematous. I palpate a discrete mass in the right breast at 10
o'clock 3 cm from the nipple.

Targeted ultrasound is performed, showing a hypoechoic mass in the
right breast at 10 o'clock 3 cm from the nipple measuring 2.9 x
x 2.8 cm. Sonographic evaluation the right axilla does not show any
enlarged adenopathy.
IMPRESSION: Indeterminate mass in the 10 o'clock region of the right breast.
This may be fat necrosis/oil cyst but malignancy cannot be excluded.

RECOMMENDATION:
Ultrasound-guided core biopsy in the mass in the 10 o'clock region
of the right breast is recommended. The patient states that she is
going to be scheduled for breast MRI. The ultrasound-guided core
biopsy can after the patient's breast MRI.

I have discussed the findings and recommendations with the patient.
If applicable, a reminder letter will be sent to the patient
regarding the next appointment.

BI-RADS CATEGORY  4: Suspicious.

## 2022-01-12 IMAGING — CT CT HEAD W/O CM
3 of 4 series · 14 of 47 positions shown, 16 images · non-contrast
Comparison: 01/20/2020

CLINICAL DATA: Multiple recent syncopal episodes with resultant
head injury.

EXAM:
CT HEAD WITHOUT CONTRAST
TECHNIQUE: Contiguous axial images were obtained from the base of the skull
through the vertex without intravenous contrast.

[Series 2: head 5.0 hc40 · axial · 0.42mm/px · z∈[-40,+80]mm · 8 of 30 slices shown, 10 images]
[im 3/30  brain]
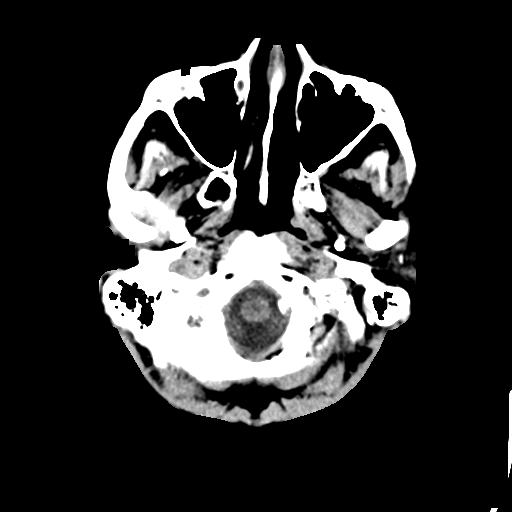
[im 3/30  bone]
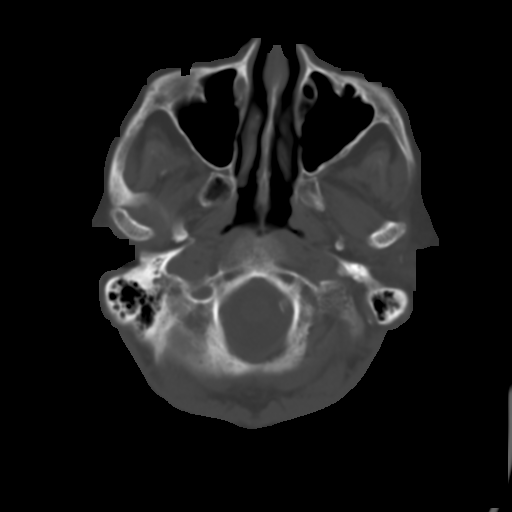
[im 7/30  brain]
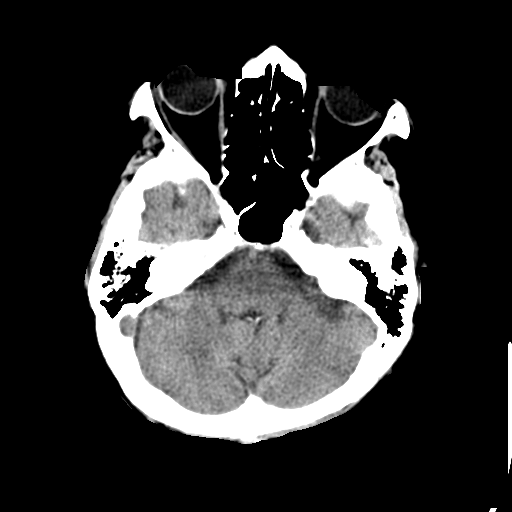
[im 11/30  brain]
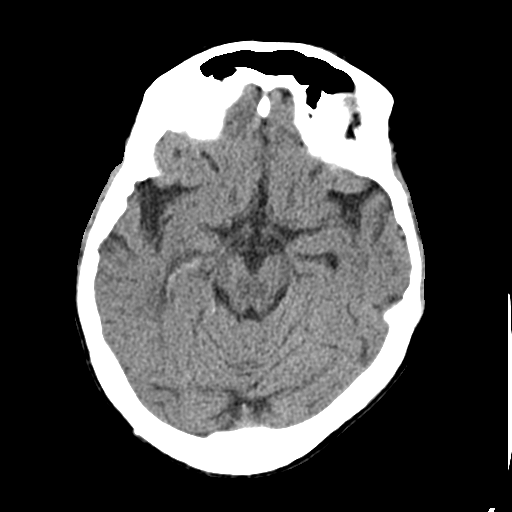
[im 13/30  brain]
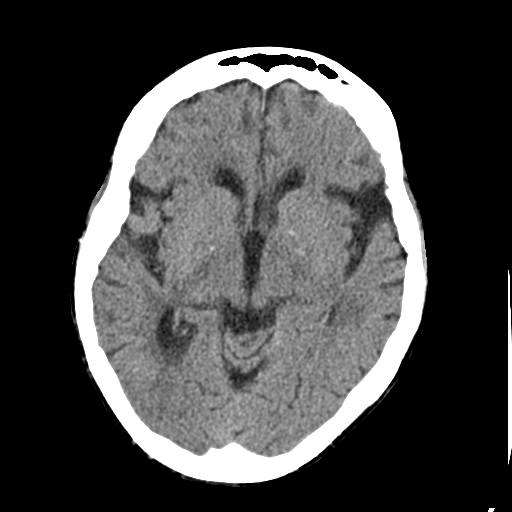
[im 17/30  brain]
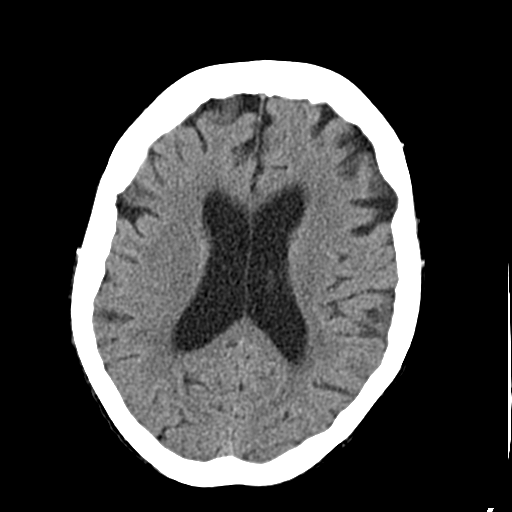
[im 17/30  bone]
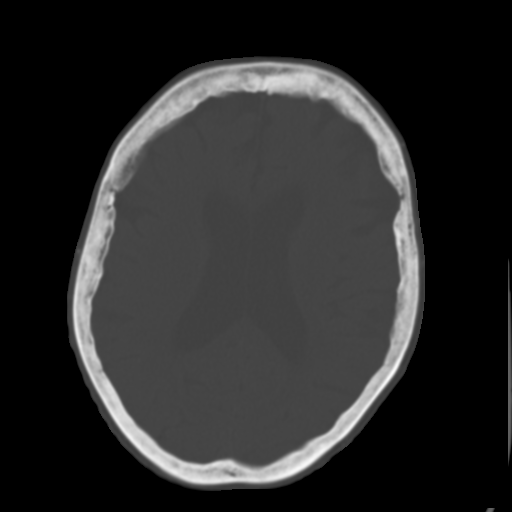
[im 19/30  brain]
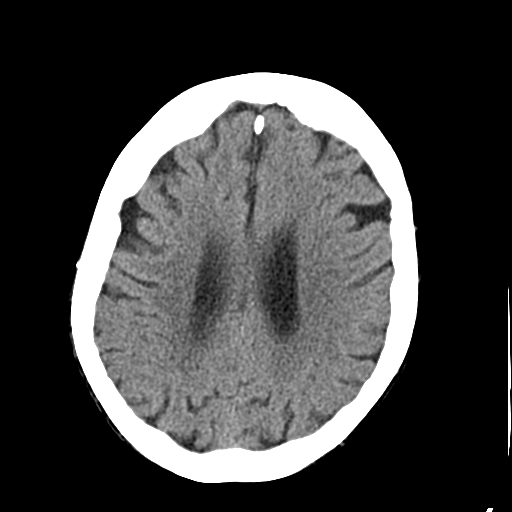
[im 23/30  brain]
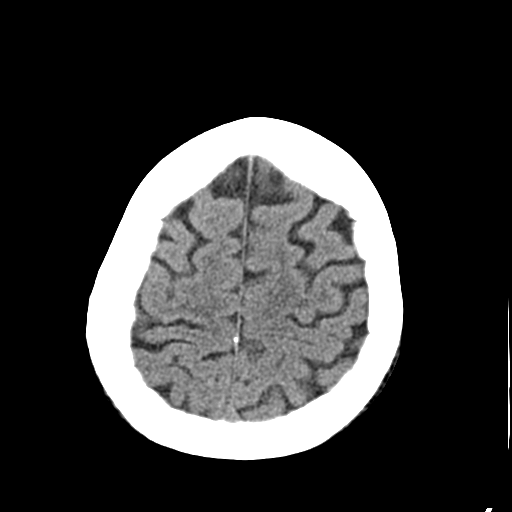
[im 27/30  brain]
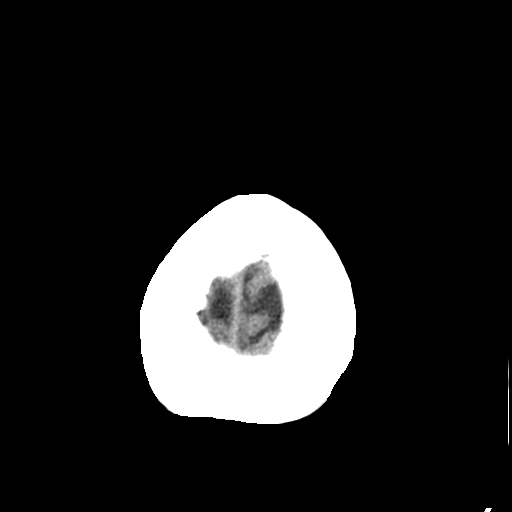

[Series 4: head 3.0 mpr cor · coronal · 0.29mm/px · 3 of 75 slices shown]
[im 25/75  brain]
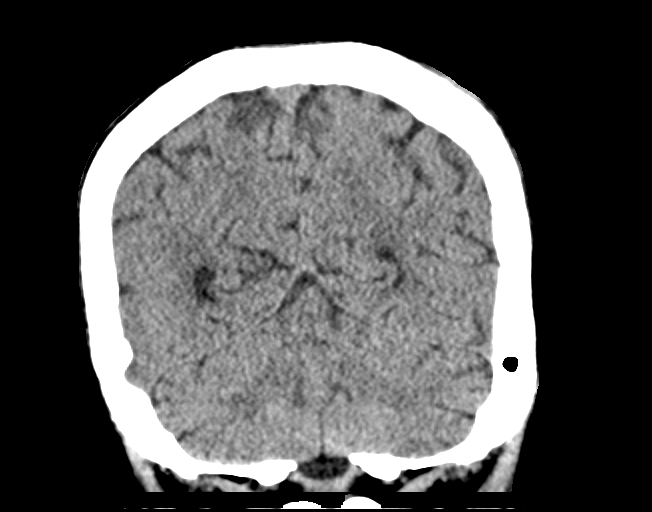
[im 33/75  brain]
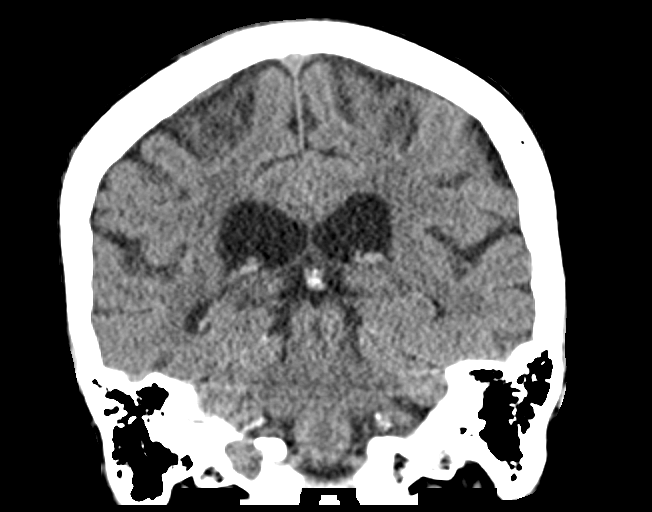
[im 42/75  brain]
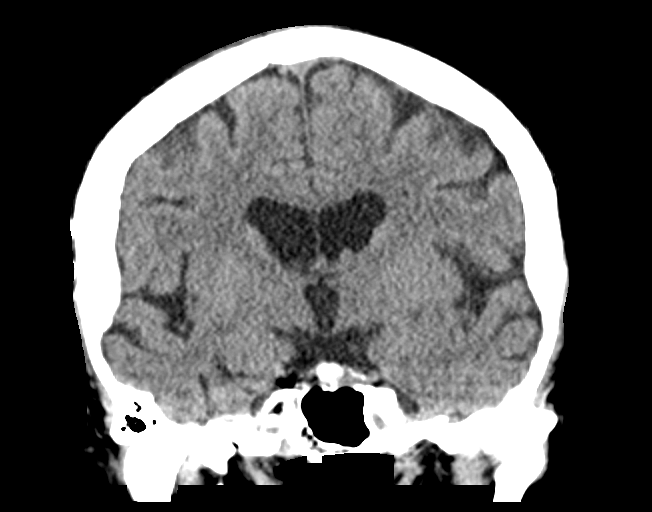

[Series 5: head 3.0 mpr sag · sagittal · 0.29mm/px · 3 of 71 slices shown]
[im 24/71  brain]
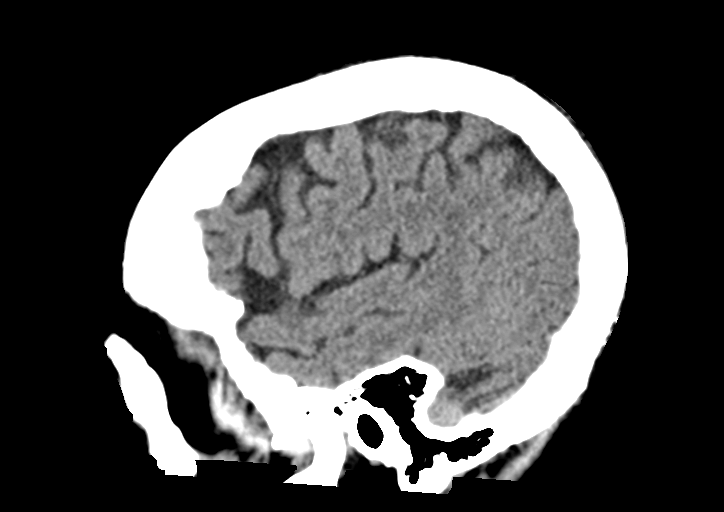
[im 36/71  brain]
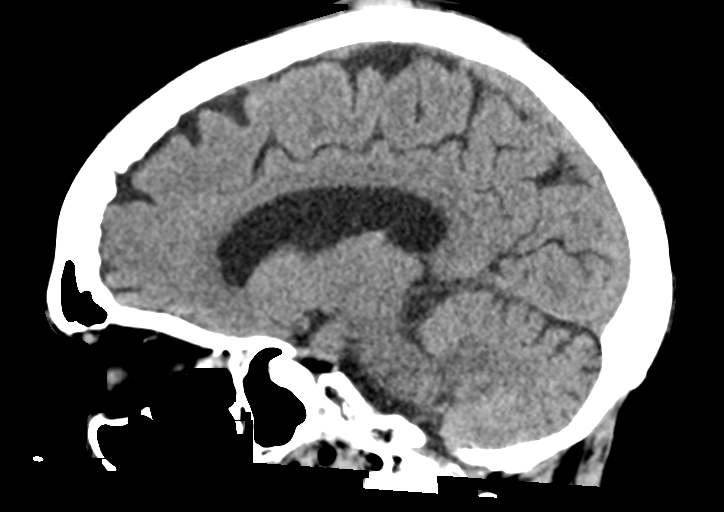
[im 47/71  brain]
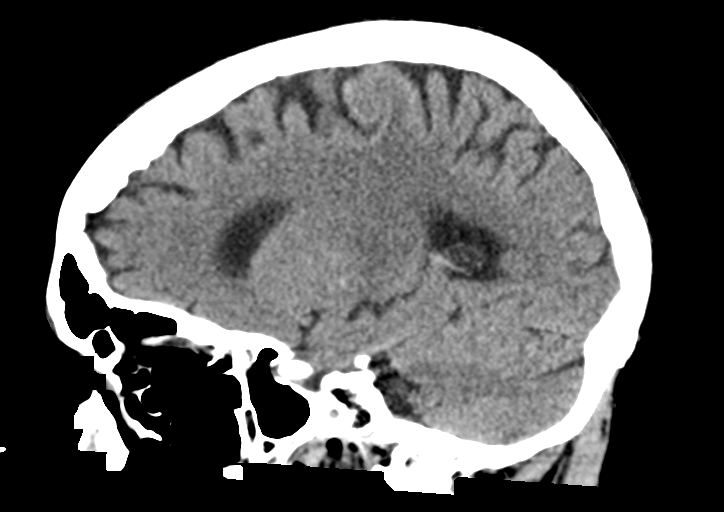

[14 of 47 positions shown; findings below may reference images not displayed]

FINDINGS: Brain: No significant change in mild-to-moderate enlargement of the
ventricles and subarachnoid spaces. No intracranial hemorrhage, mass
lesion or CT evidence of acute infarction. 2

Vascular: No hyperdense vessel or unexpected calcification.

Skull: Normal. Negative for fracture or focal lesion.

Sinuses/Orbits: Status post bilateral cataract extraction.
Unremarkable bones and included paranasal sinuses.

Other: Small scalp hematoma at the skull vertex to the right of
midline. There is an additional small scalp hematoma or area of
scalp scarring near the vertex on the left.
IMPRESSION: 1. Small scalp hematoma(s) at the skull vertex without skull
fracture or intracranial hemorrhage.
2. Stable mild to moderate diffuse cerebral and cerebellar atrophy.

## 2022-02-17 IMAGING — MR MR MRA NECK W/O CM
11 of 14 series · 28 of 48 positions shown · non-contrast
Comparison: 04/26/2020 and prior.

CLINICAL DATA: Syncope, recurrent; Syncope; Headache, new or
worsening (Age >= 50y)



[Series 3: T1 · sagittal · 5.0mm · 0.47mm/px · 1 of 24 slices shown (1 of 2)]
[im 1/24]
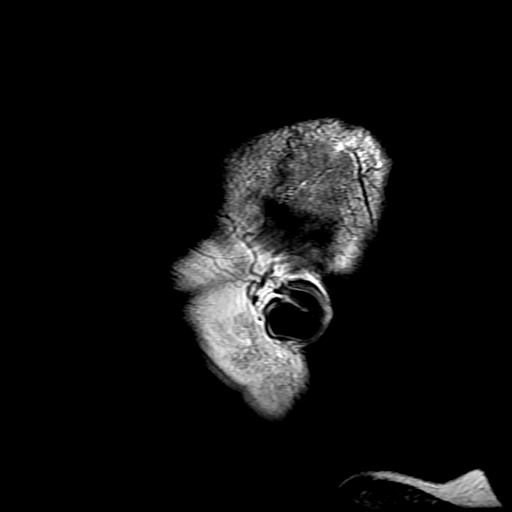

[Series 4: DWI · axial · 3.0mm · 1.09mm/px · z∈[-55,+82]mm · 6 of 94 slices shown (1 of 4)]
[im 1/94]
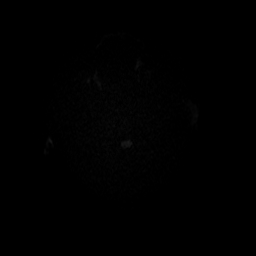
[im 19/94]
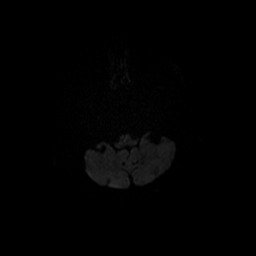
[im 38/94]
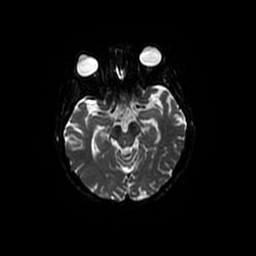
[im 56/94]
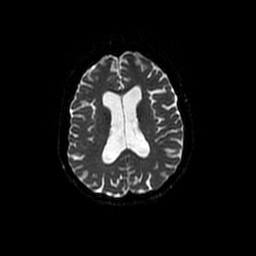
[im 75/94]
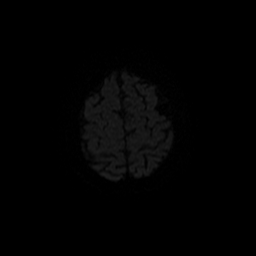
[im 94/94]
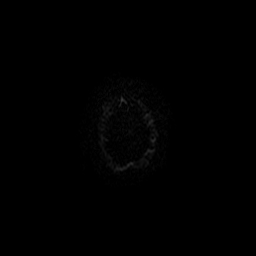

[Series 5: DWI · coronal · 5.0mm · 1.09mm/px · 4 of 60 slices shown (2 of 4)]
[im 1/60]
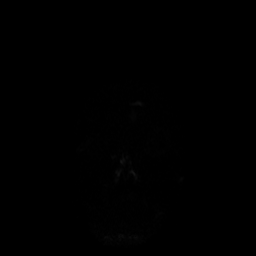
[im 20/60]
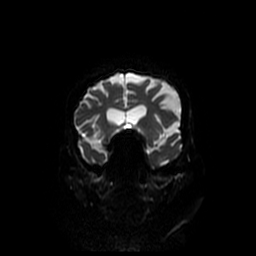
[im 40/60]
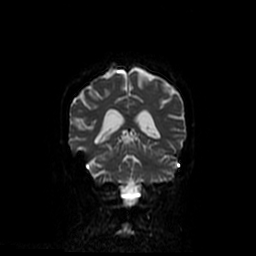
[im 60/60]
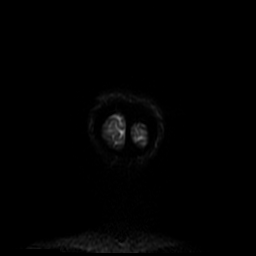

[Series 7: T2 · axial · 5.0mm · 0.45mm/px · z∈[-53,+84]mm · 2 of 24 slices shown (1 of 3)]
[im 1/24]
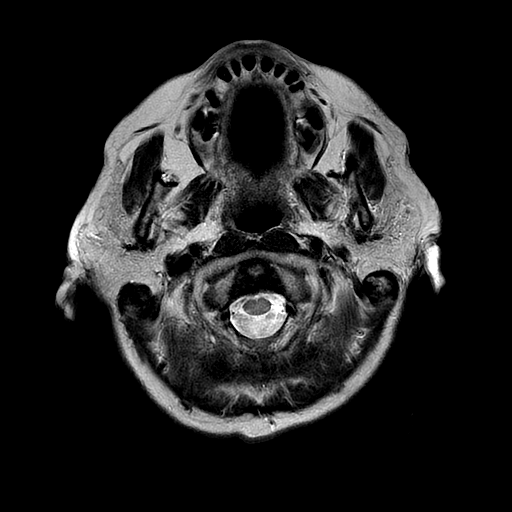
[im 24/24]
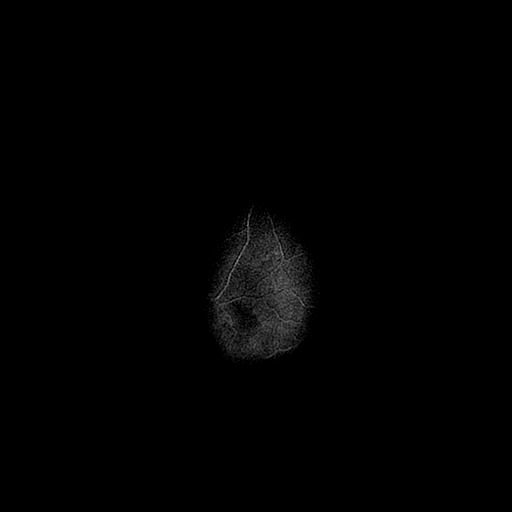

[Series 8: FLAIR · axial · 3.0mm · 0.43mm/px · 1 of 12 slices shown (1 of 2)]
[im 1/12]
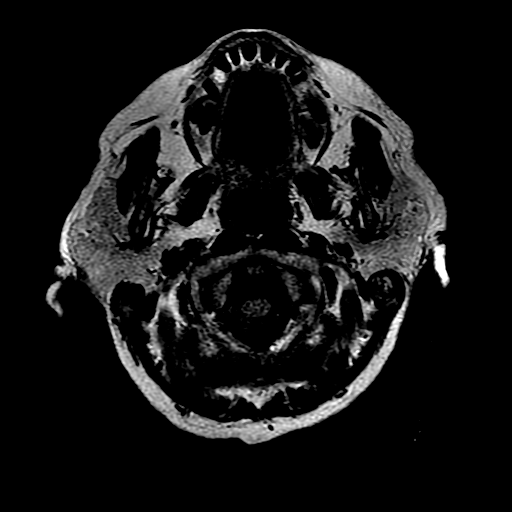

[Series 10: T2 · coronal · 5.0mm · 0.39mm/px · 2 of 24 slices shown (2 of 3)]
[im 1/24]
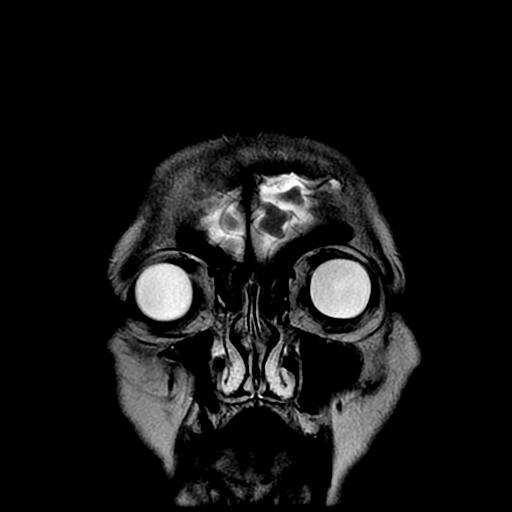
[im 24/24]
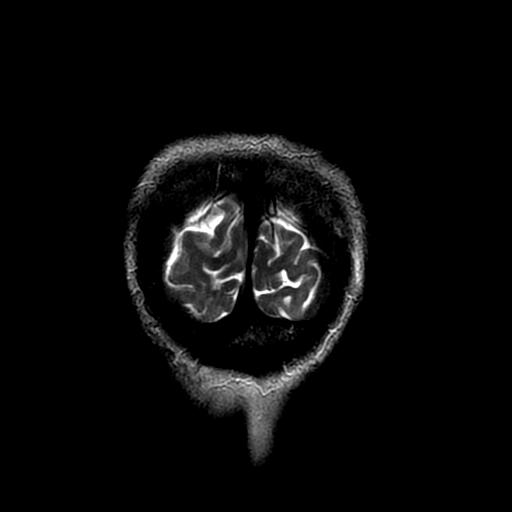

[Series 11: T1 · axial · non-contrast · 3.0mm · 0.47mm/px · z∈[-54,+59]mm · 5 of 96 slices shown (2 of 2)]
[im 1/96]
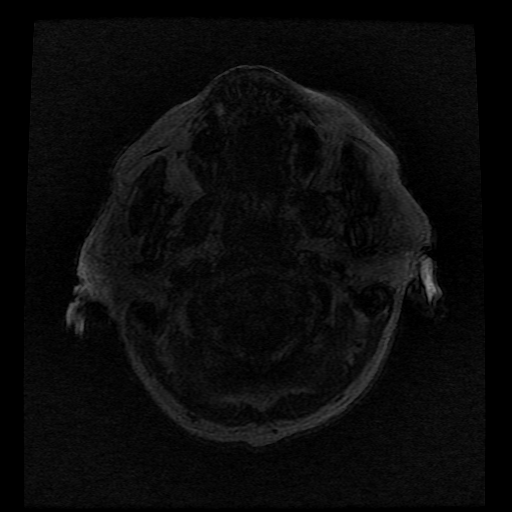
[im 20/96]
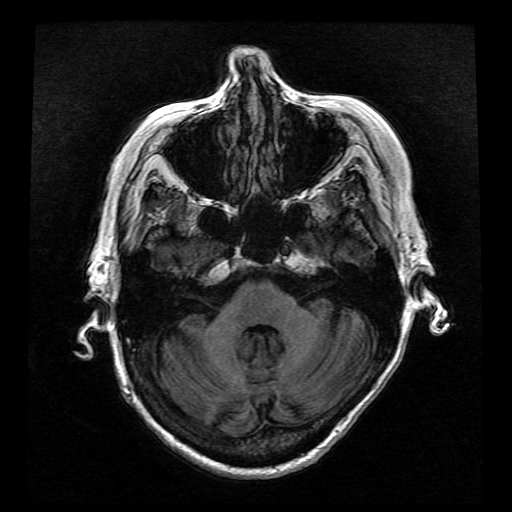
[im 39/96]
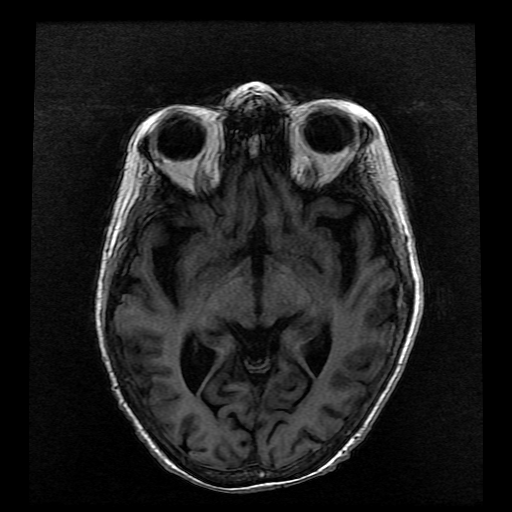
[im 58/96]
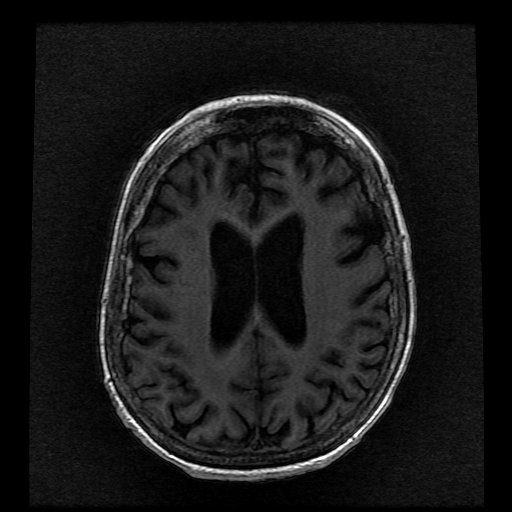
[im 77/96]
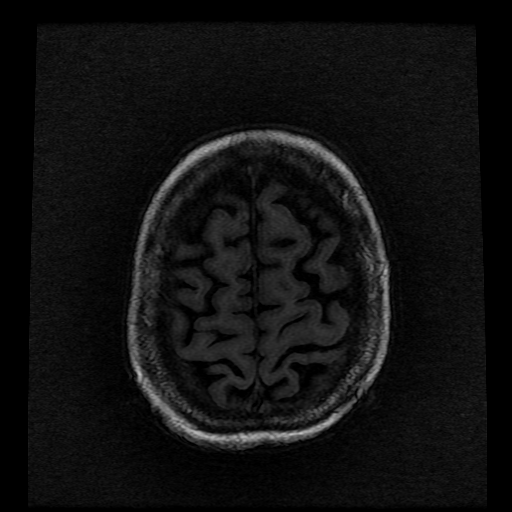

[Series 12: T2 · coronal · 3.0mm · 0.35mm/px · 1 of 23 slices shown (3 of 3)]
[im 1/23]
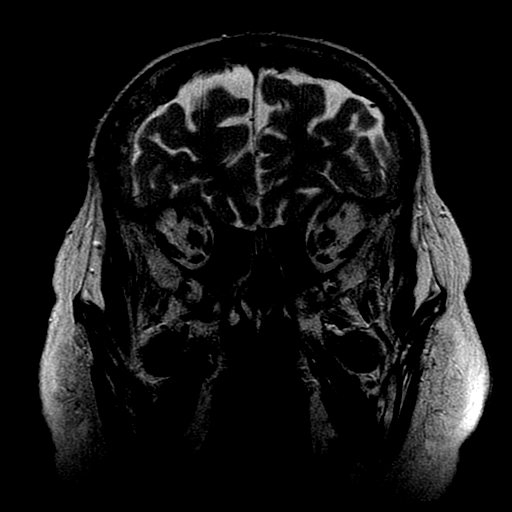

[Series 13: FLAIR · coronal · 3.0mm · 0.35mm/px · 1 of 23 slices shown (2 of 2)]
[im 1/23]
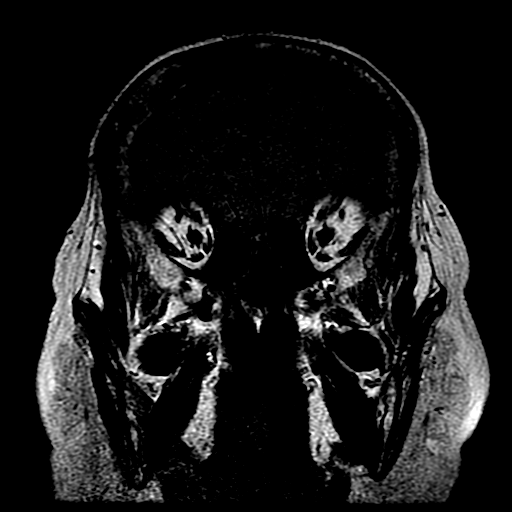

[Series 400: DWI · axial · 3.0mm · 1.09mm/px · z∈[-55,+82]mm · 3 of 47 slices shown (3 of 4)]
[im 1/47]
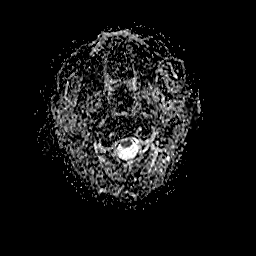
[im 24/47]
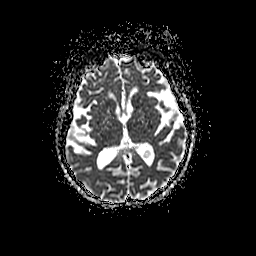
[im 47/47]
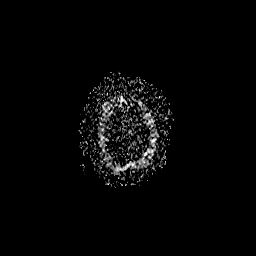

[Series 500: DWI · coronal · 5.0mm · 1.09mm/px · 2 of 30 slices shown (4 of 4)]
[im 1/30]
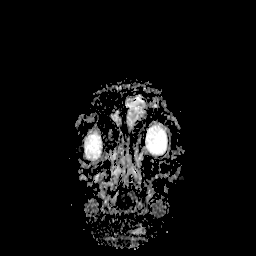
[im 30/30]
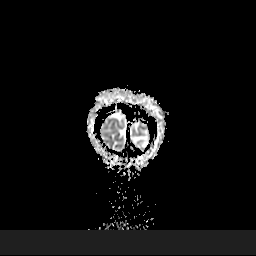

[28 of 48 positions shown; findings below may reference images not displayed]

FINDINGS: MRI HEAD FINDINGS

Brain: No diffusion-weighted signal abnormality. No intracranial
hemorrhage. No midline shift, ventriculomegaly or extra-axial fluid
collection. No mass lesion. Mild cerebral atrophy with ex vacuo
dilatation. Chronic left cerebellar lacunar insults. Minimal chronic
microvascular ischemic changes involving the pons.

The bilateral hippocampal complexes are symmetric demonstrating
normal signal intensity and morphology.

Vascular: Please see MRA.

Skull and upper cervical spine: Normal marrow signal.

Sinuses/Orbits: Sequela of bilateral lens replacement. Minimal
ethmoid and maxillary sinus mucosal thickening. Trace right mastoid
effusion.

Other: None.

MRA HEAD FINDINGS

Anterior circulation: Patent. No significant stenosis, proximal
occlusion, aneurysm, or vascular malformation.

Posterior circulation: Patent. Fetal origin of the left PCA. Mild
narrowing of the mid left superior cerebellar artery. No high-grade
stenosis, proximal occlusion, aneurysm, or vascular malformation.

Venous sinuses: No evidence of thrombosis.

Anatomic variants: Please see above.

MRA NECK FINDINGS

Please note image quality is mildly degraded by motion artifact.

Three-vessel aortic arch. Patent great vessel origins. Patent
bilateral common carotid arteries. Motion artifact at the level the
bifurcation limits evaluation. 30% proximal right ICA luminal
narrowing. Patent bilateral cervical ICAs.

Codominant vertebral arteries demonstrate antegrade flow. No
evidence of dissection or aneurysm. No high-grade narrowing.
IMPRESSION: MRI head:

No acute intracranial process. Chronic left cerebellar lacunar
insults.

Mild cerebral atrophy and minimal chronic microvascular ischemic
changes.

MRA head and neck:

No high-grade narrowing within the head or neck.

Mild mid left superior cerebellar artery narrowing.

30% proximal right ICA luminal narrowing.

## 2022-02-27 IMAGING — DX DG CHEST 1V PORT
1 series · 1 of 1 positions shown · non-contrast
Comparison: 07/29/2019

CLINICAL DATA: Shortness of breath

EXAM:
PORTABLE CHEST 1 VIEW

[chest ap]
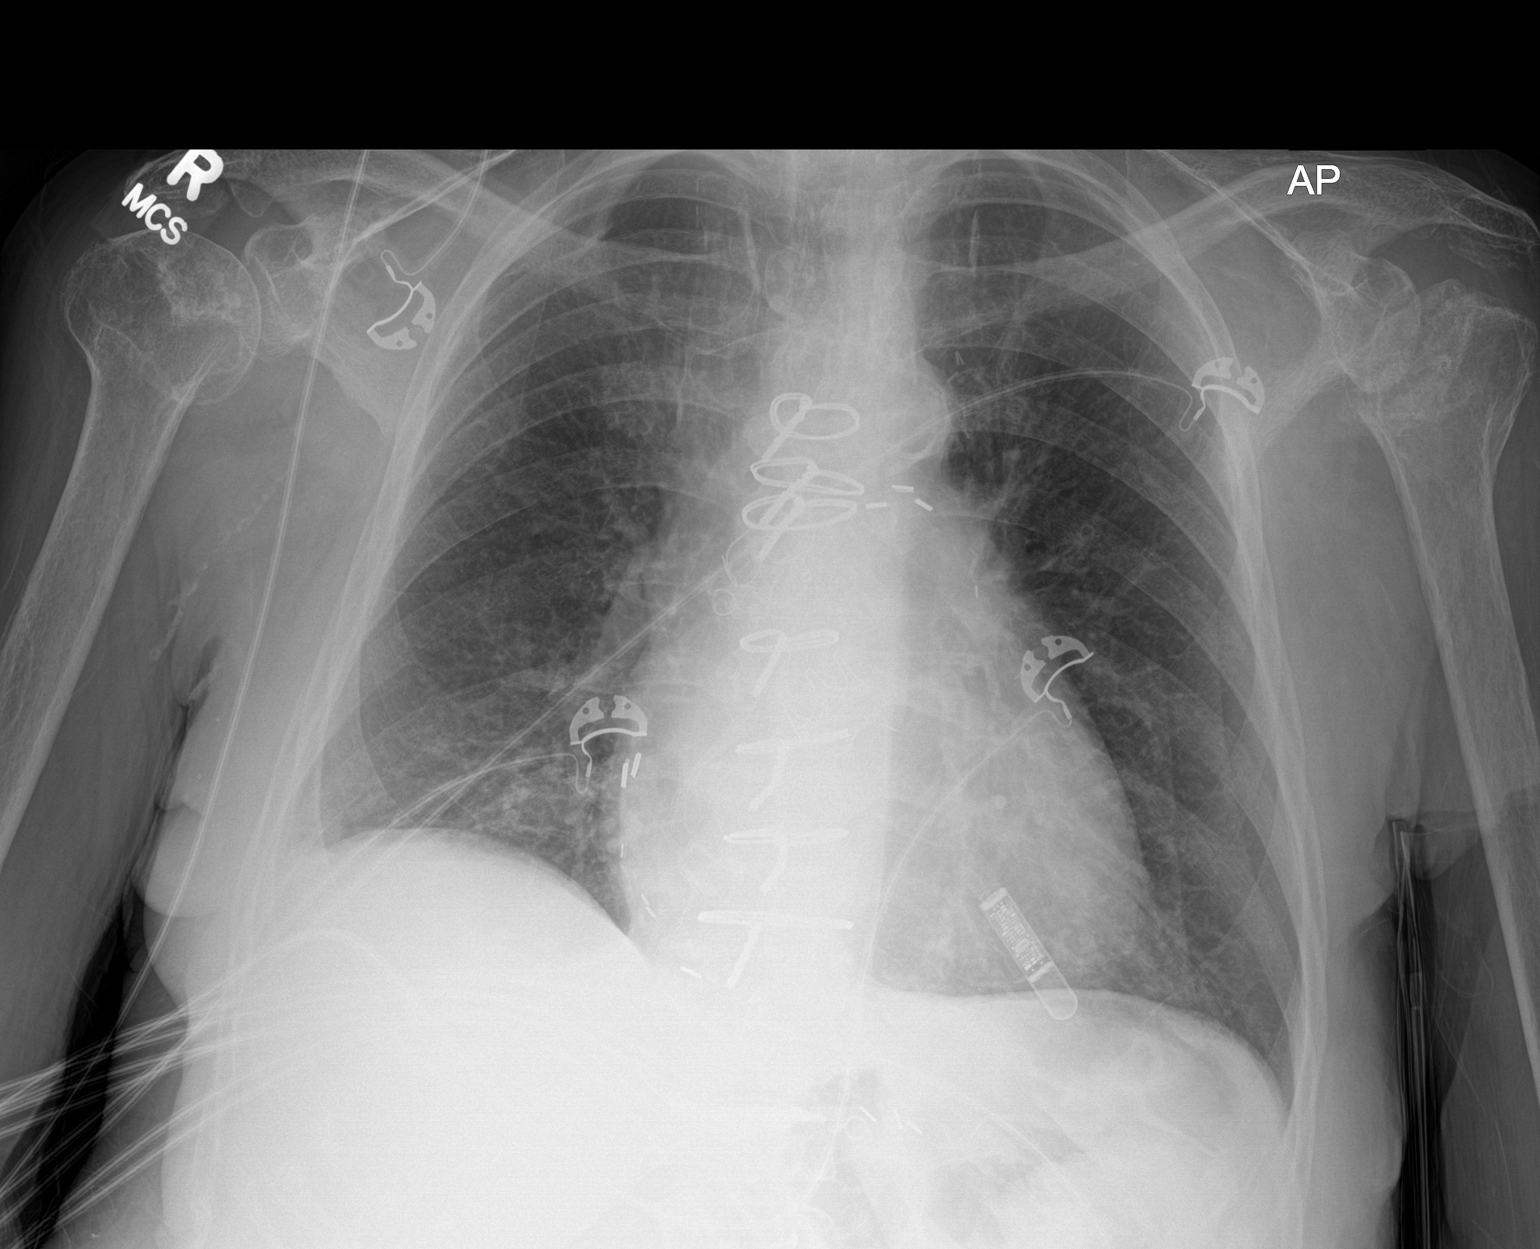

[1 of 1 positions shown; findings below may reference images not displayed]

FINDINGS: Prior CABG. Heart is normal size. Minimal bibasilar atelectasis. No
effusions. No acute bony abnormality.
IMPRESSION: Bibasilar atelectasis.

## 2022-04-28 IMAGING — CR DG CHEST 2V
2 series · 2 of 2 positions shown · non-contrast
Comparison: Chest x-ray dated 06/11/2020

CLINICAL DATA: Pacemaker

EXAM:
CHEST - 2 VIEW

[w chest pa]
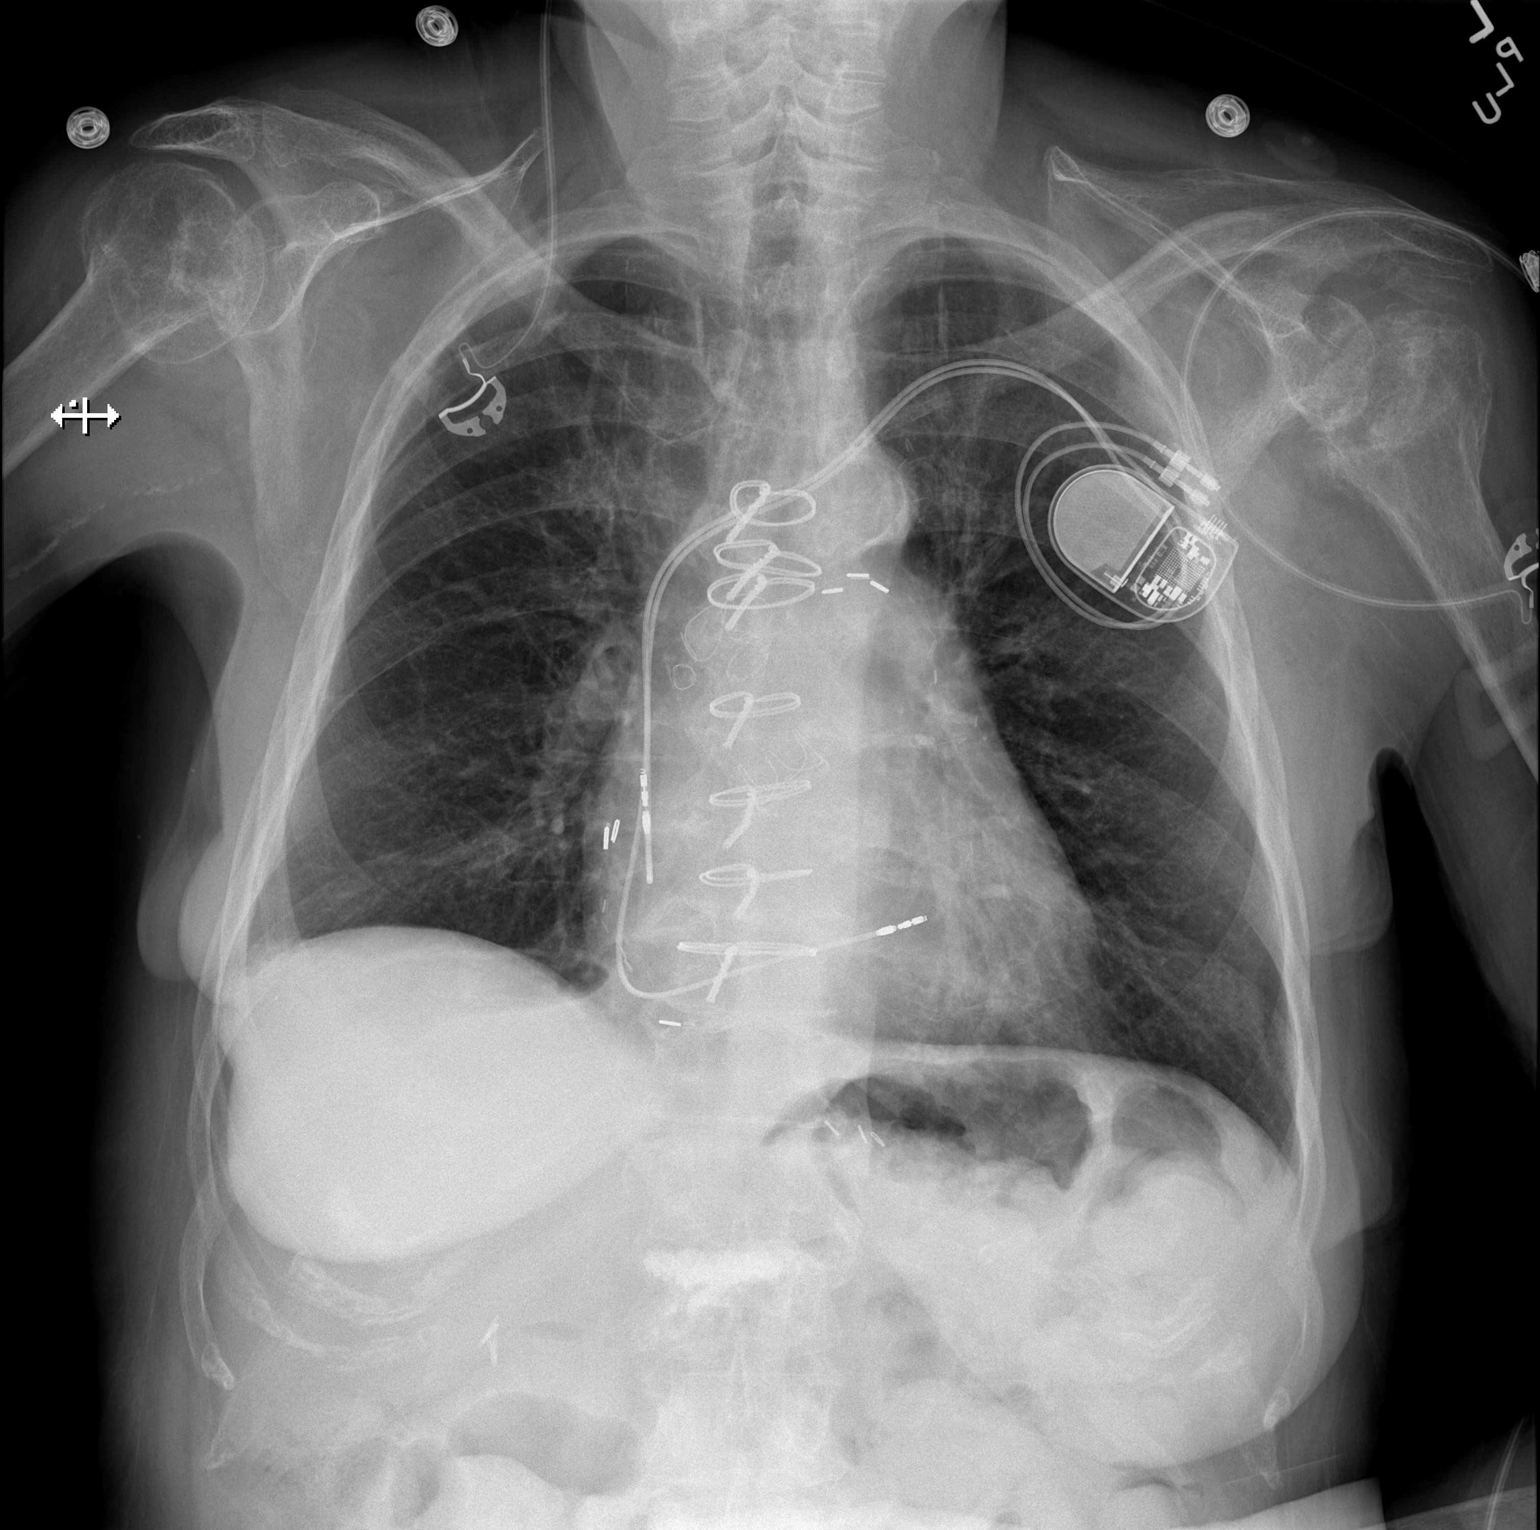

[w chest lat]
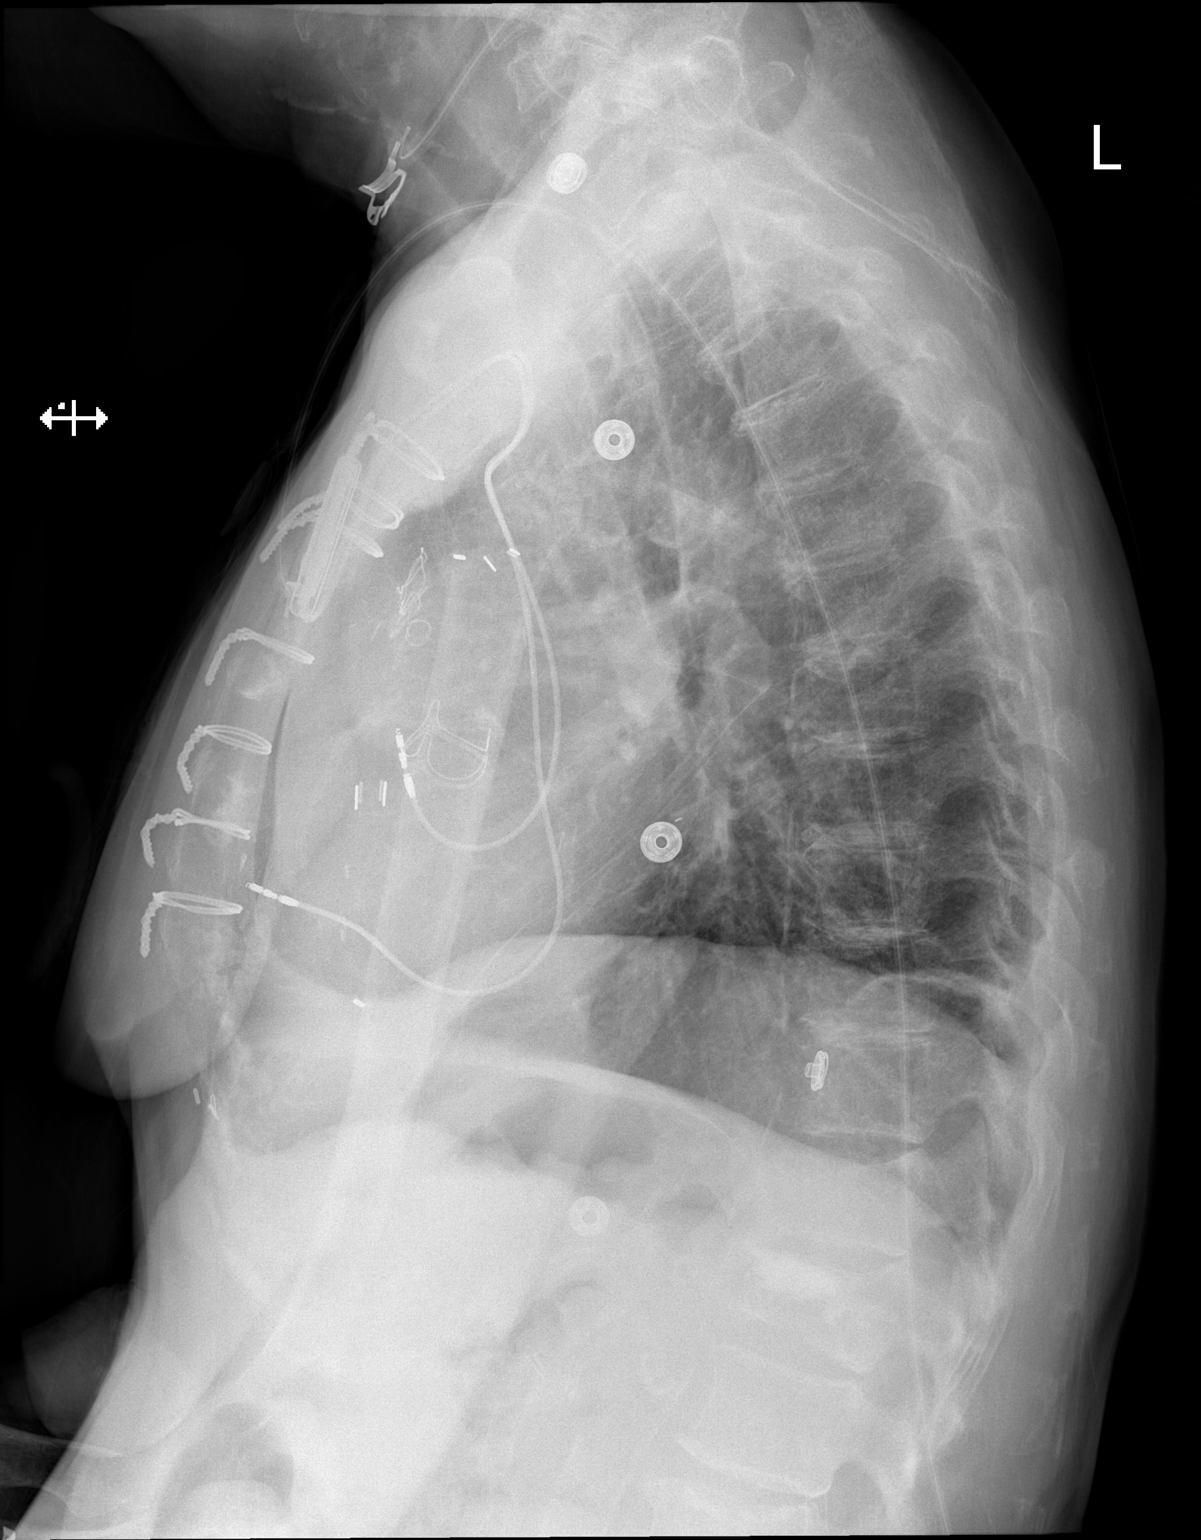

[2 of 2 positions shown; findings below may reference images not displayed]

FINDINGS: Interval placement of a LEFT chest wall pacemaker. Leads appear
appropriately positioned. No pleural effusion or pneumothorax is
seen. Heart size and mediastinal contours are stable. Advanced
degenerative change at the LEFT shoulder.
IMPRESSION: Interval pacemaker placement. No pneumothorax seen.

## 2022-05-12 IMAGING — NM NM SCAN TUMOR LOCALIZE WITH SPECT
2 series · 7 of 7 positions shown · non-contrast
Comparison: none

CLINICAL DATA: HEART FAILURE. CONCERN FOR CARDIAC AMYLOIDOSIS.

EXAM:
NUCLEAR MEDICINE TUMOR LOCALIZATION. PYP CARDIAC AMYLOIDOSIS SCAN
WITH SPECT
TECHNIQUE: Following intravenous administration of radiopharmaceutical,
anterior planar images of the chest were obtained. Regions of
interest were placed on the heart and contralateral chest wall for
quantitative assessment. Additional SPECT imaging of the chest was
obtained.
RADIOPHARMACEUTICALS:  Twenty-one mCi TECHNETIUM 99 PYROPHOSPHATE

[Series 1: anterior · 4.14mm/px · 1 of 1 slices shown]
[im 1/1]
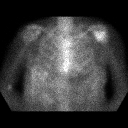

[Series 2: amyloid spect · 4.14mm/px · 6 of 64 frames shown]
[frame 6/64]
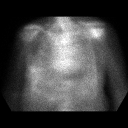
[frame 16/64]
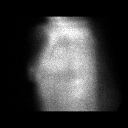
[frame 27/64]
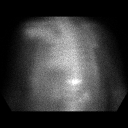
[frame 38/64]
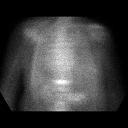
[frame 48/64]
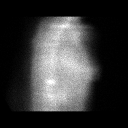
[frame 59/64]
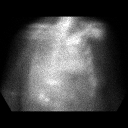

[7 of 7 positions shown; findings below may reference images not displayed]

FINDINGS: Planar Visual assessment:

Anterior planar imaging demonstrates radiotracer uptake within the
heart less than uptake within the adjacent ribs (Grade 1

Quantitative assessment :

Quantitative assessment of the cardiac uptake compared to the
contralateral chest wall is equal to 1.1 (H/CL = ) .

SPECT assessment: SPECT imaging of the chest demonstrates minimal
radiotracer accumulation within the LEFT ventricle.

Incidental compression fracture in the lower thoracic spine.
IMPRESSION: Visual and quantitative assessment (grade 1, H/CLL equal 1.1) are
NOT suggestive of transthyretin amyloidosis.

## 2022-06-25 IMAGING — US US BREAST*R* LIMITED INC AXILLA
1 series · 6 of 6 positions shown · non-contrast
Comparison: Previous exam(s).

CLINICAL DATA: 75-year-old female with history of right breast
biopsies of the right breast demonstrating fat necrosis. Patient was
evaluated for a palpable lump in the right breast in February 2020
for which biopsy was recommended however patient did not return for
biopsy or scheduled MRI. No new problems today.

EXAM:
DIGITAL DIAGNOSTIC UNILATERAL RIGHT MAMMOGRAM WITH TOMOSYNTHESIS AND
CAD; ULTRASOUND RIGHT BREAST LIMITED
TECHNIQUE: Right digital diagnostic mammography and breast tomosynthesis was
performed. The images were evaluated with computer-aided detection.;
Targeted ultrasound examination of the right breast was performed

[Series 1: us breast*right* limited inc axilla · 0.08mm/px · 6 of 6 slices shown]
[im 1/6]
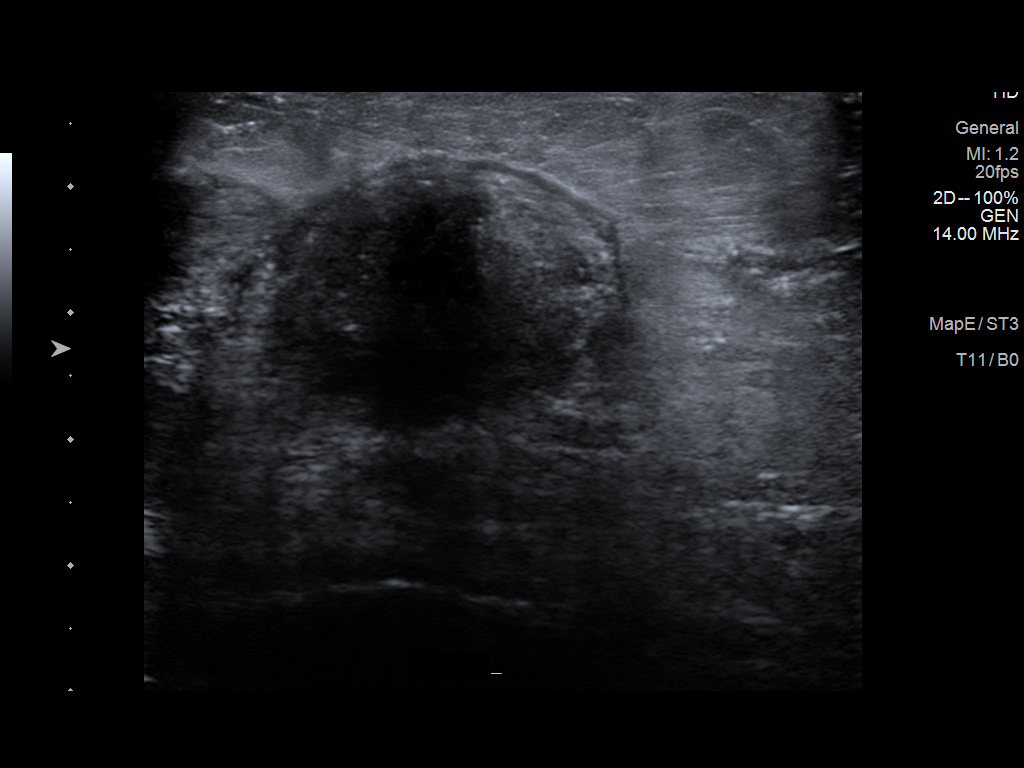
[im 2/6]
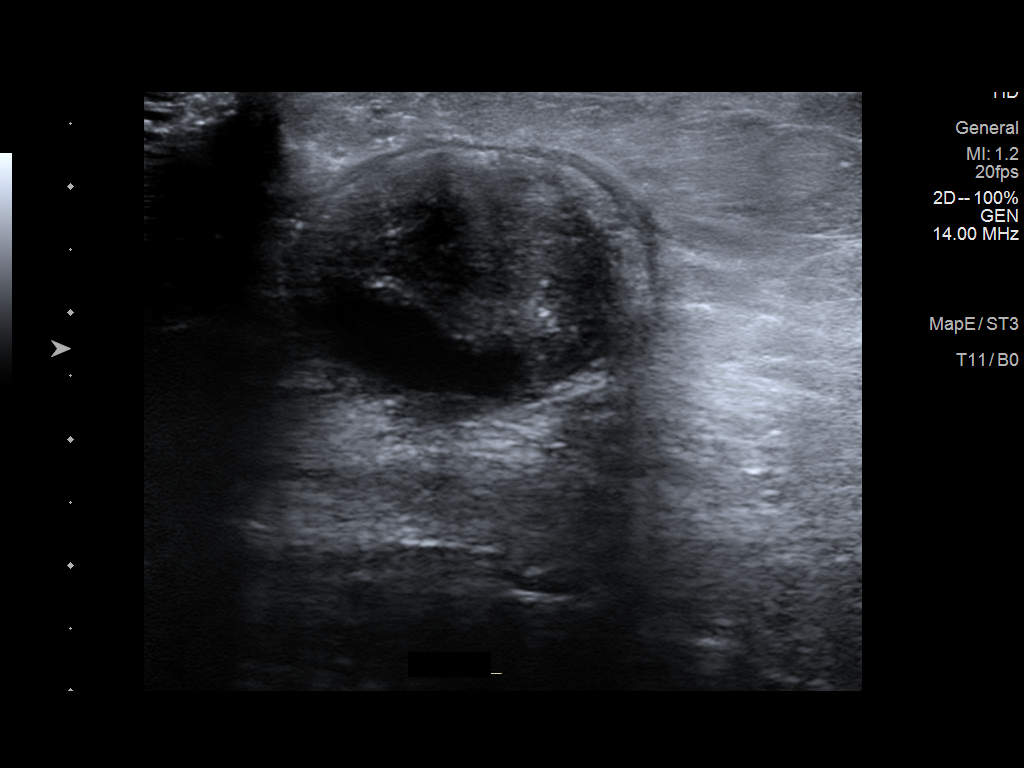
[im 3/6]
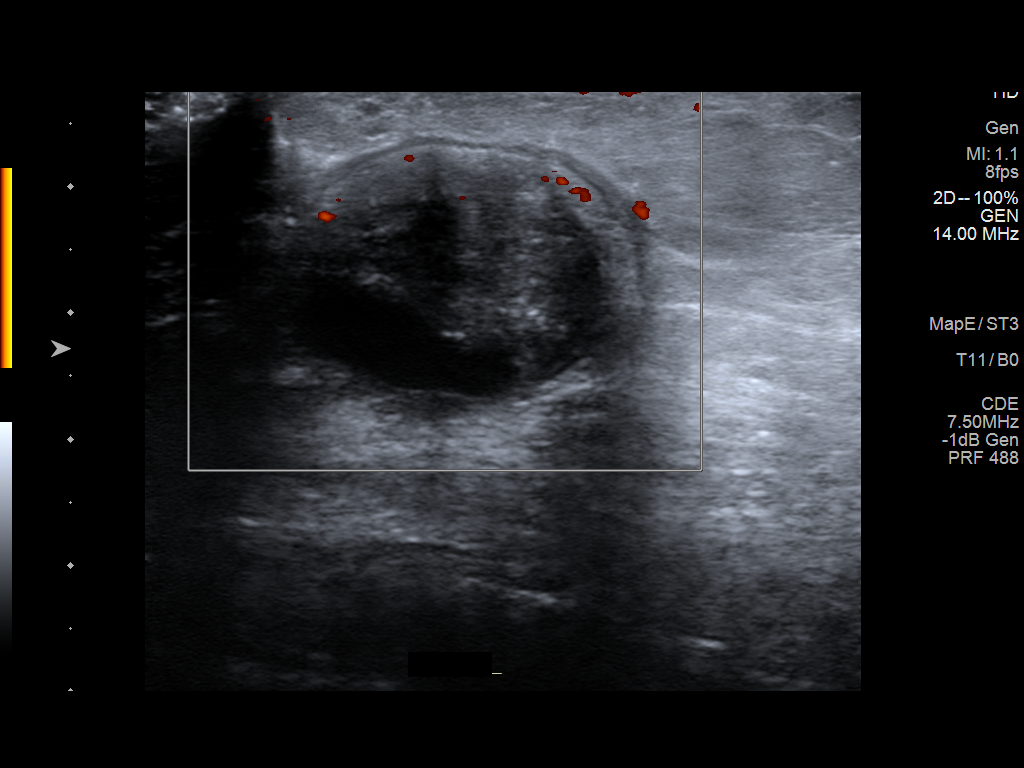
[im 4/6]
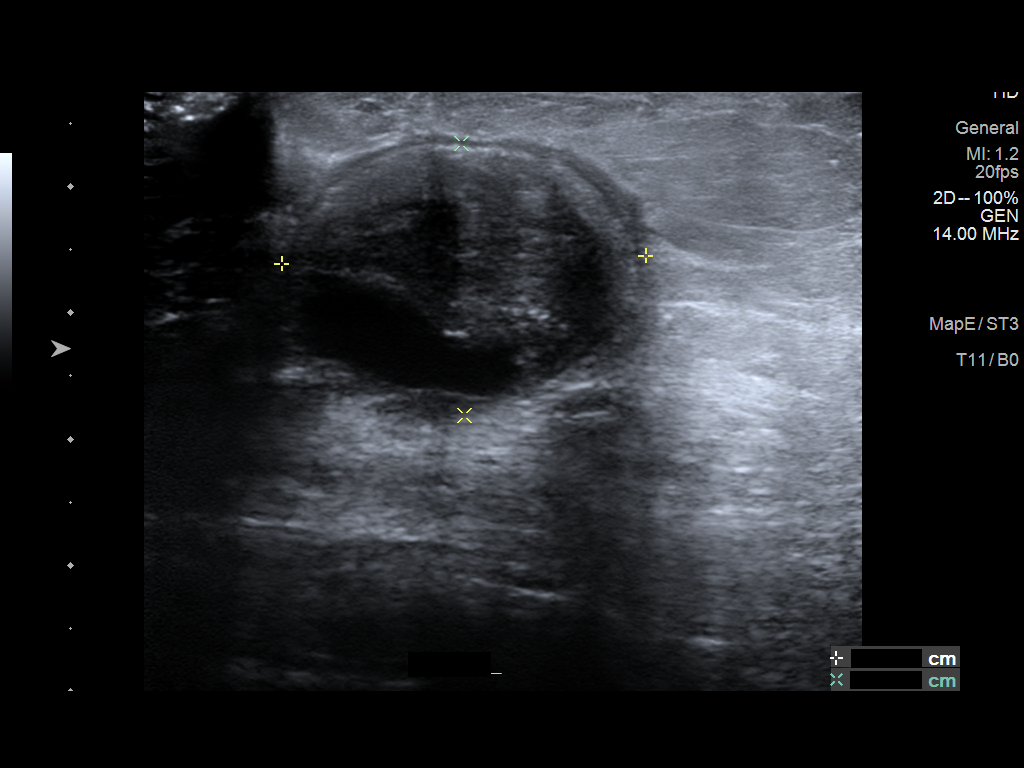
[im 5/6]
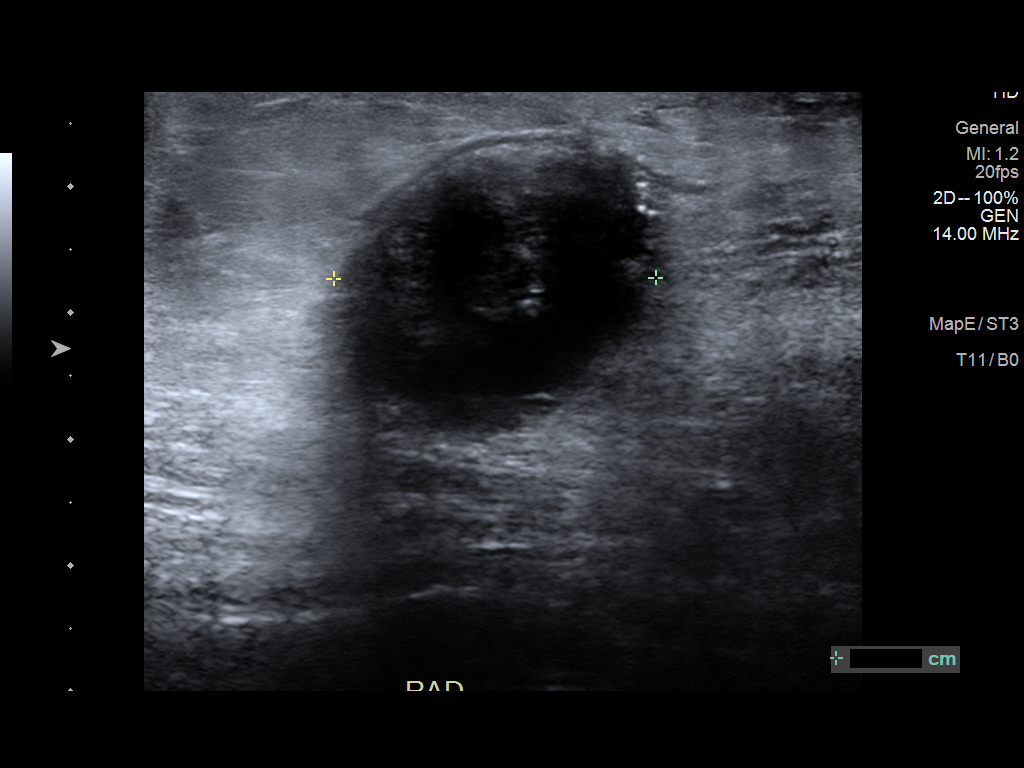
[im 6/6]
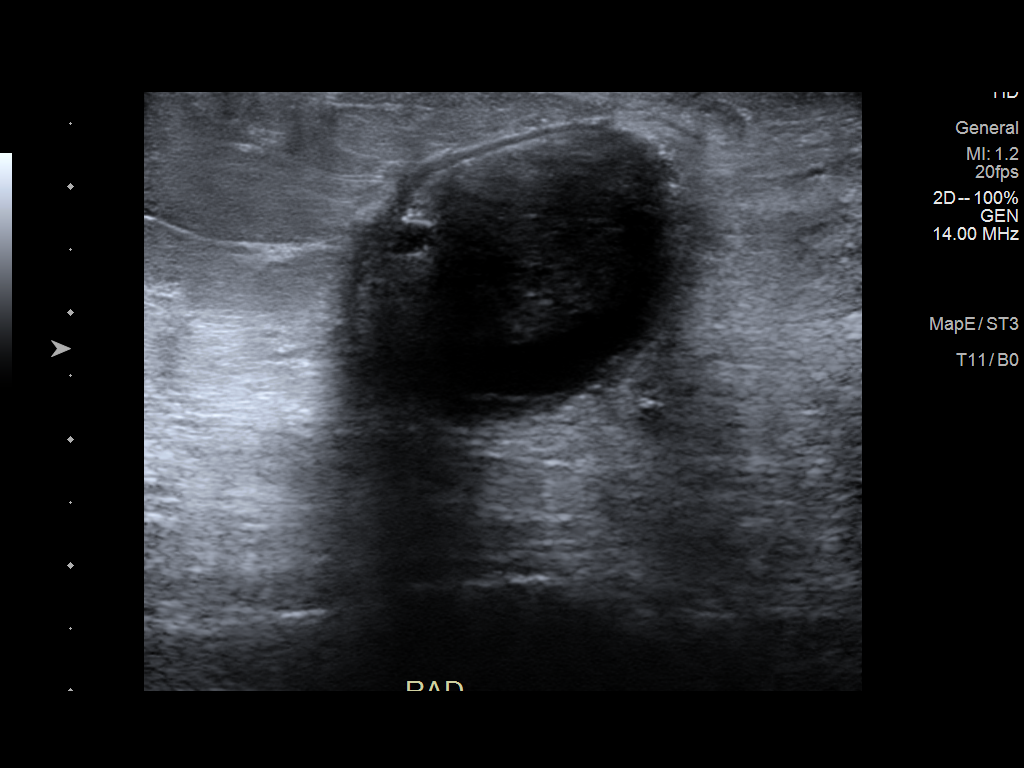

[6 of 6 positions shown; findings below may reference images not displayed]

ACR Breast Density Category c: The breast tissue is heterogeneously
dense, which may obscure small masses.
FINDINGS: Mammogram:

Full field tomosynthesis views of the right breast were performed.
There is a stable oval mass in the upper outer right breast at the
patient's surgical site. There are other benign postsurgical changes
including fat necrosis and distortion. Diffuse skin thickening is
consistent with post radiation change.

Ultrasound:

Targeted ultrasound performed in the right breast at 10 o'clock 3 cm
from the nipple demonstrating an oval circumscribed complex mass,
possibly an old seroma or hematoma. This measures 2.9 x 2.2 x
cm, previously measuring 2.9 x 2.0 x 2.8 cm.
IMPRESSION: Stable probably benign right breast mass at 10 o'clock, possibly
representing a chronic postoperative collection. No significant
change in 6 months. No new findings in the right breast.

RECOMMENDATION:
Diagnostic bilateral mammogram and right breast ultrasound in 6
months.

I have discussed the findings and recommendations with the patient
who agrees to short-term follow-up. If applicable, a reminder letter
will be sent to the patient regarding the next appointment.

BI-RADS CATEGORY  3: Probably benign.

## 2022-06-25 IMAGING — MG MM DIGITAL DIAGNOSTIC UNILAT*R* W/ TOMO W/ CAD
4 series · 4 of 12 positions shown · non-contrast
Comparison: Previous exam(s).

CLINICAL DATA: 75-year-old female with history of right breast
biopsies of the right breast demonstrating fat necrosis. Patient was
evaluated for a palpable lump in the right breast in February 2020
for which biopsy was recommended however patient did not return for
biopsy or scheduled MRI. No new problems today.

EXAM:
DIGITAL DIAGNOSTIC UNILATERAL RIGHT MAMMOGRAM WITH TOMOSYNTHESIS AND
CAD; ULTRASOUND RIGHT BREAST LIMITED
TECHNIQUE: Right digital diagnostic mammography and breast tomosynthesis was
performed. The images were evaluated with computer-aided detection.;
Targeted ultrasound examination of the right breast was performed

[R MLO synth-2D]
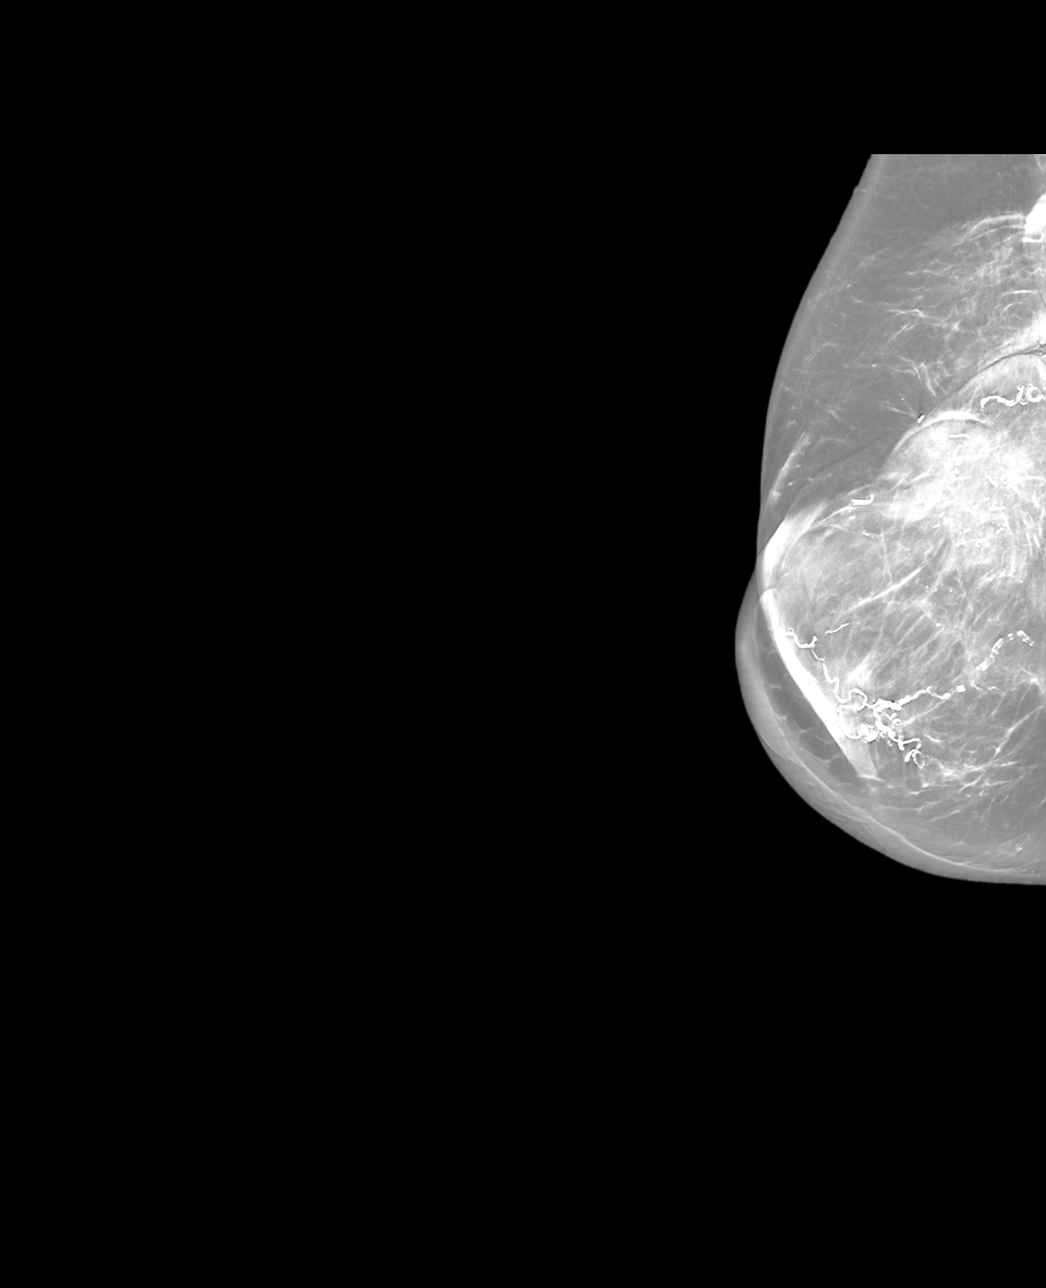

[R CC synth-2D]
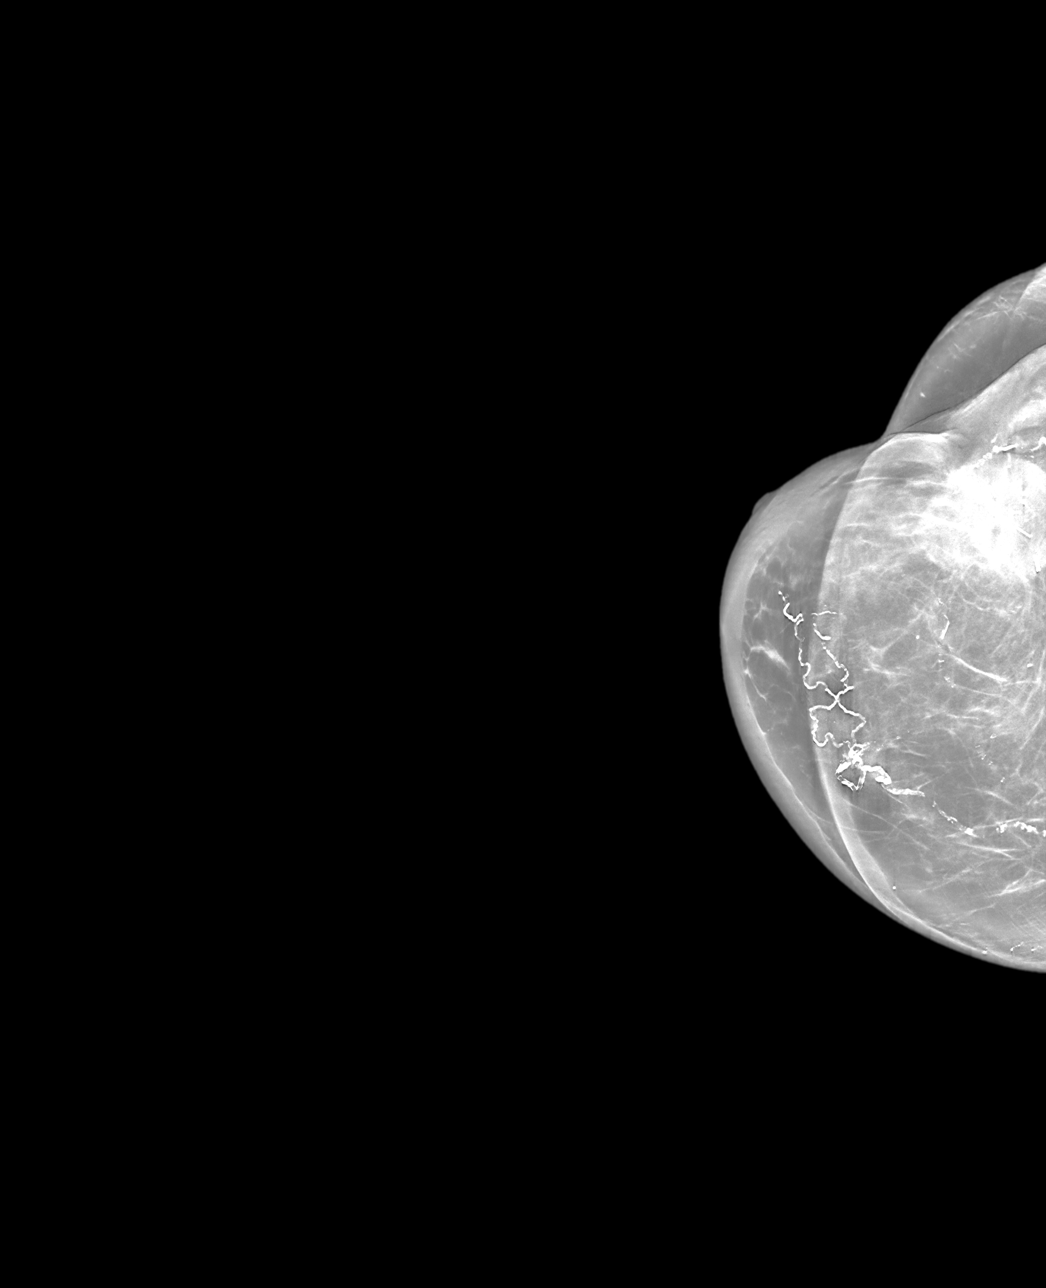

[R CC tomo · tomo slice 41/80.0]
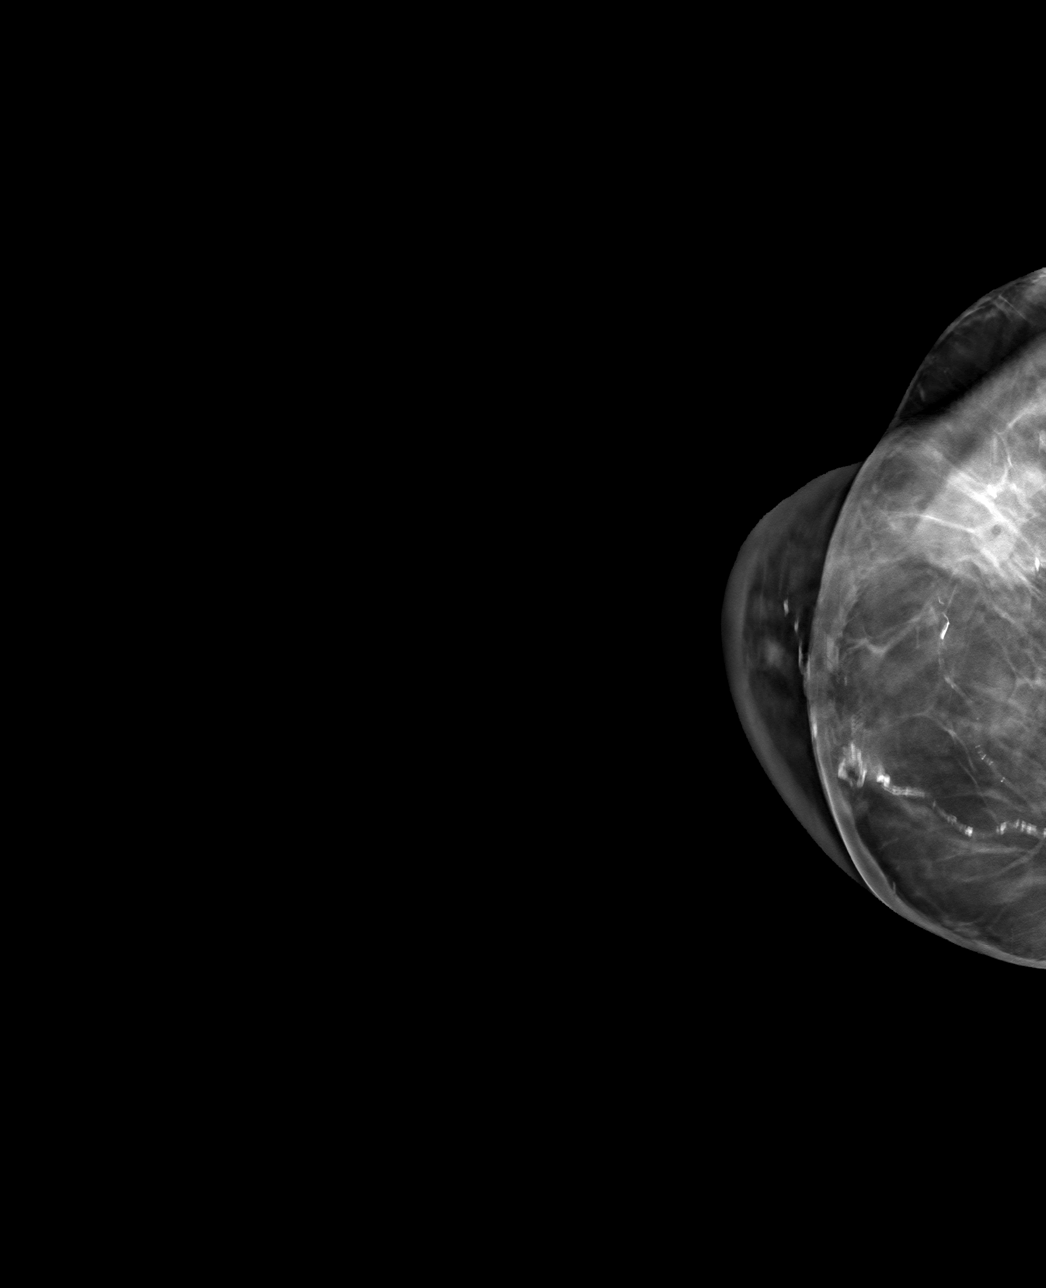

[R MLO tomo · tomo slice 43/85.0]
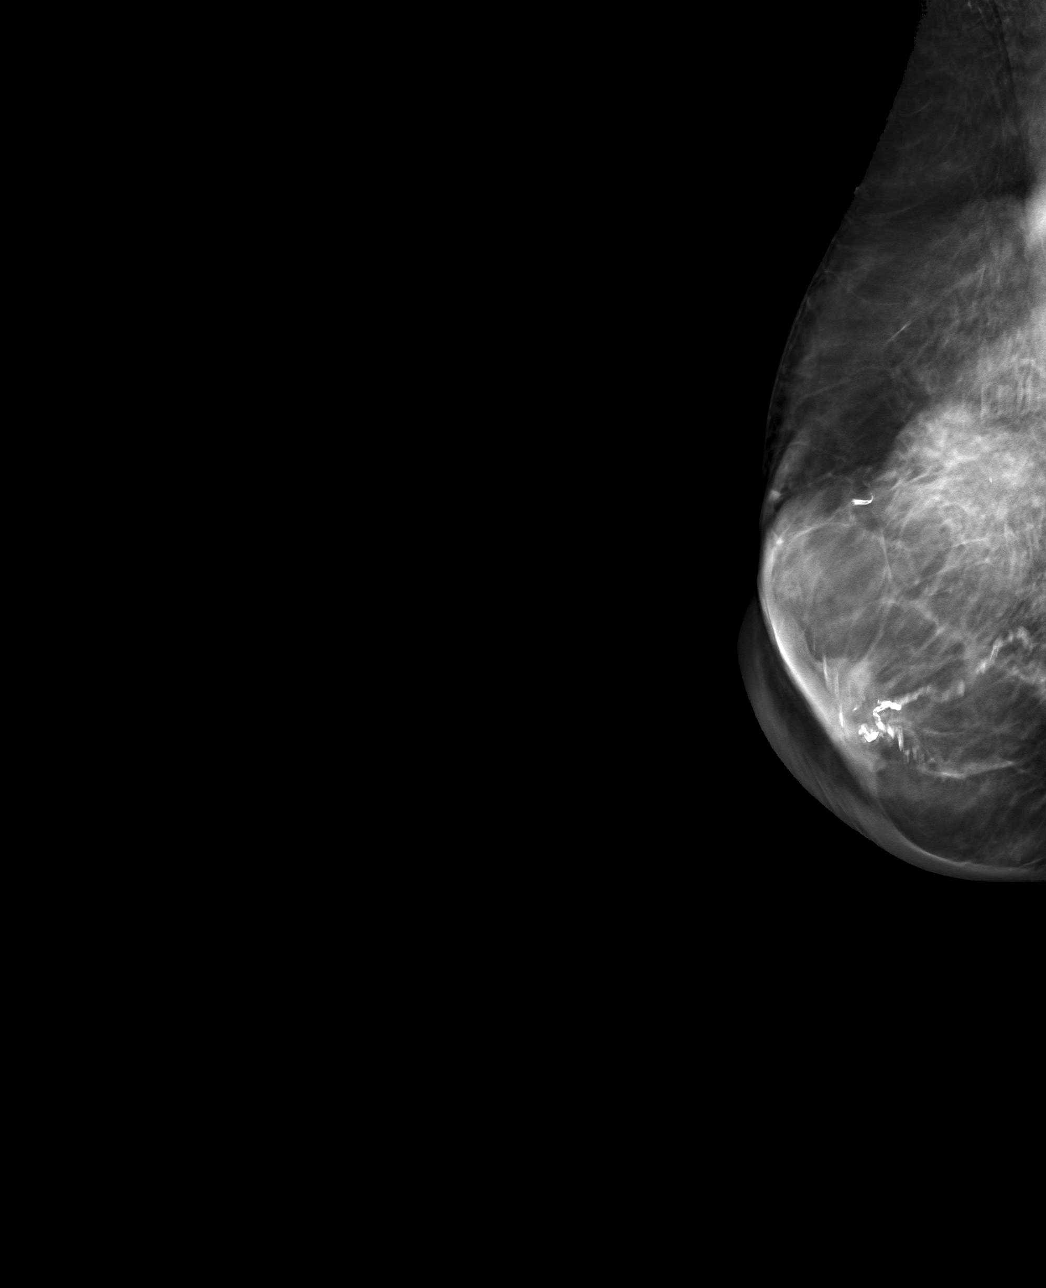

[4 of 12 positions shown; findings below may reference images not displayed]

ACR Breast Density Category c: The breast tissue is heterogeneously
dense, which may obscure small masses.
FINDINGS: Mammogram:

Full field tomosynthesis views of the right breast were performed.
There is a stable oval mass in the upper outer right breast at the
patient's surgical site. There are other benign postsurgical changes
including fat necrosis and distortion. Diffuse skin thickening is
consistent with post radiation change.

Ultrasound:

Targeted ultrasound performed in the right breast at 10 o'clock 3 cm
from the nipple demonstrating an oval circumscribed complex mass,
possibly an old seroma or hematoma. This measures 2.9 x 2.2 x
cm, previously measuring 2.9 x 2.0 x 2.8 cm.
IMPRESSION: Stable probably benign right breast mass at 10 o'clock, possibly
representing a chronic postoperative collection. No significant
change in 6 months. No new findings in the right breast.

RECOMMENDATION:
Diagnostic bilateral mammogram and right breast ultrasound in 6
months.

I have discussed the findings and recommendations with the patient
who agrees to short-term follow-up. If applicable, a reminder letter
will be sent to the patient regarding the next appointment.

BI-RADS CATEGORY  3: Probably benign.

## 2023-01-22 IMAGING — CR DG KNEE 1-2V*R*
2 series · 2 of 2 positions shown · non-contrast
Comparison: None.

CLINICAL DATA: Initial evaluation for acute trauma, fall.

EXAM:
RIGHT KNEE - 1-2 VIEW

[knee ap]
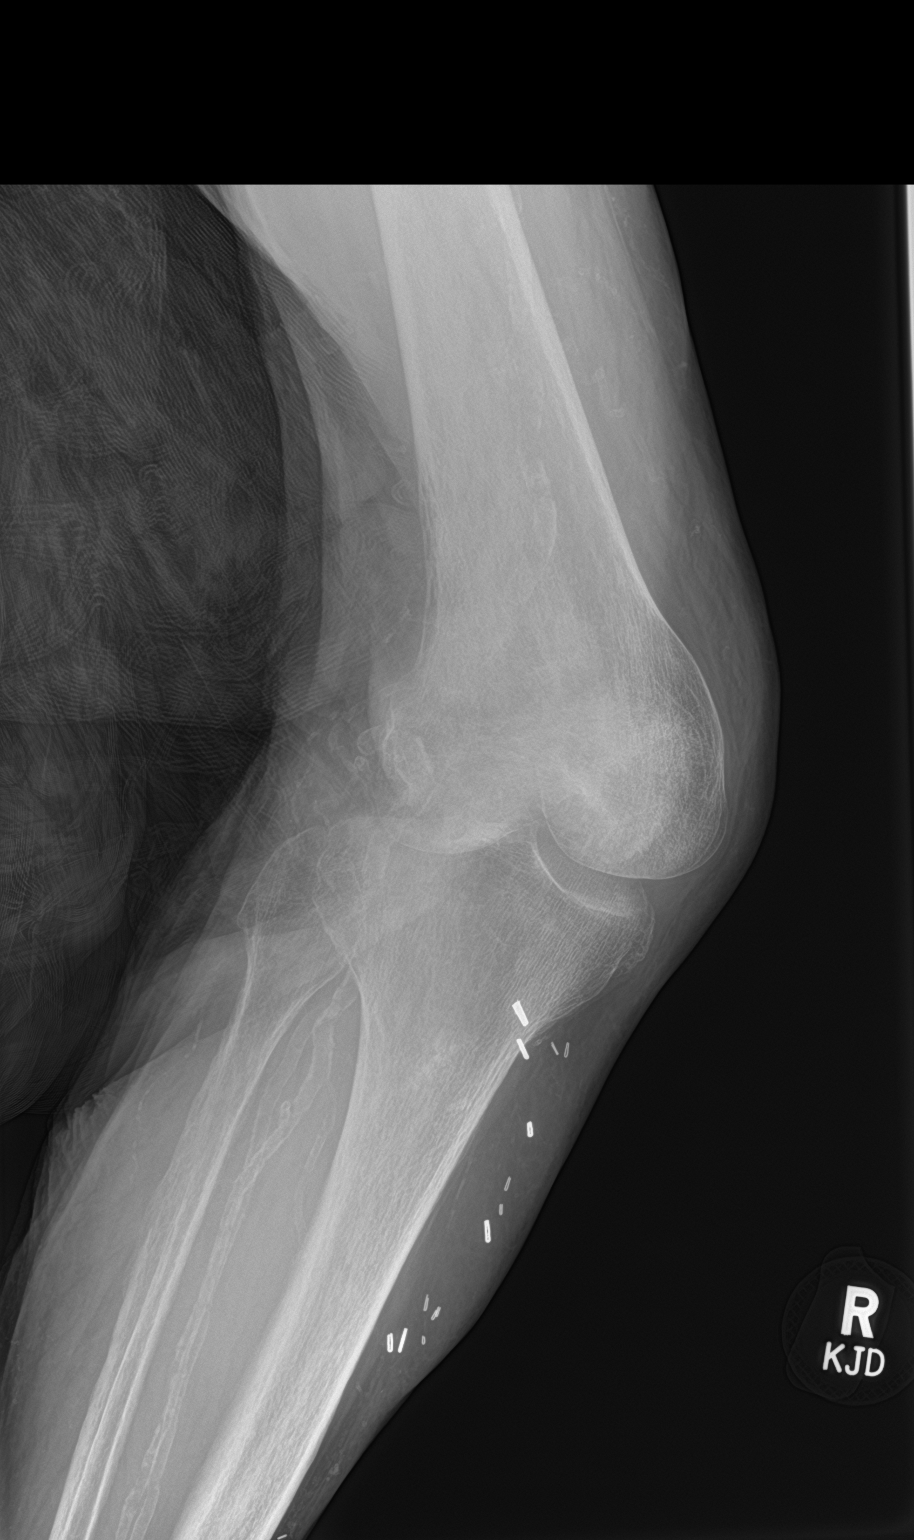

[knee lat]
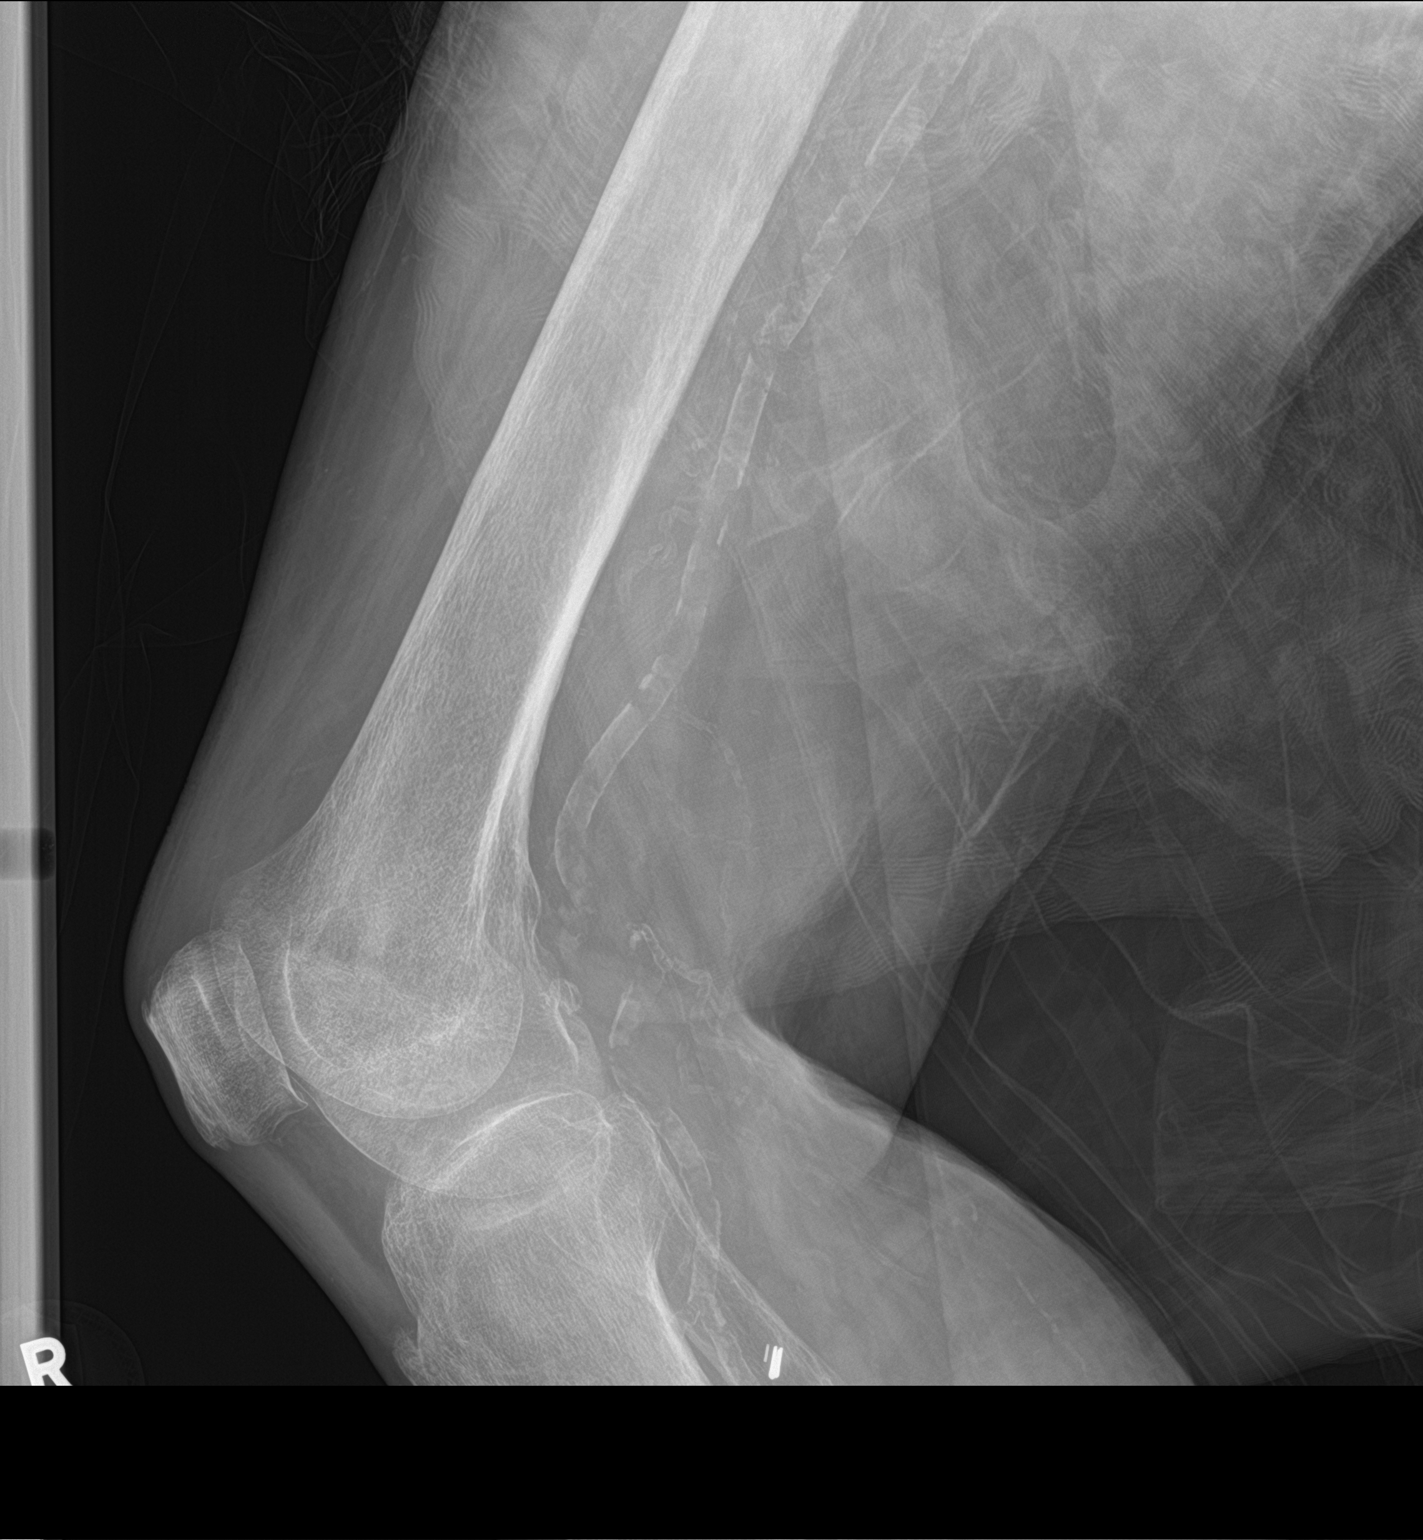

[2 of 2 positions shown; findings below may reference images not displayed]

FINDINGS: No acute fracture or dislocation. No joint effusion. Mild
osteoarthritic changes about the knee. Advanced osteopenia.

No acute soft tissue abnormality. Prominent vascular calcifications
noted. Surgical clips from presumed previous vessel harvesting noted
at the right lower extremity.
IMPRESSION: 1. No acute osseous abnormality about the right knee.
2. Advanced osteopenia.

## 2023-01-22 IMAGING — CR DG HIP (WITH OR WITHOUT PELVIS) 2-3V*R*
3 series · 3 of 3 positions shown · non-contrast
Comparison: None.

CLINICAL DATA: Initial evaluation for acute pain status post fall.

EXAM:
DG HIP (WITH OR WITHOUT PELVIS) 2-3V RIGHT

[pelvis ap]
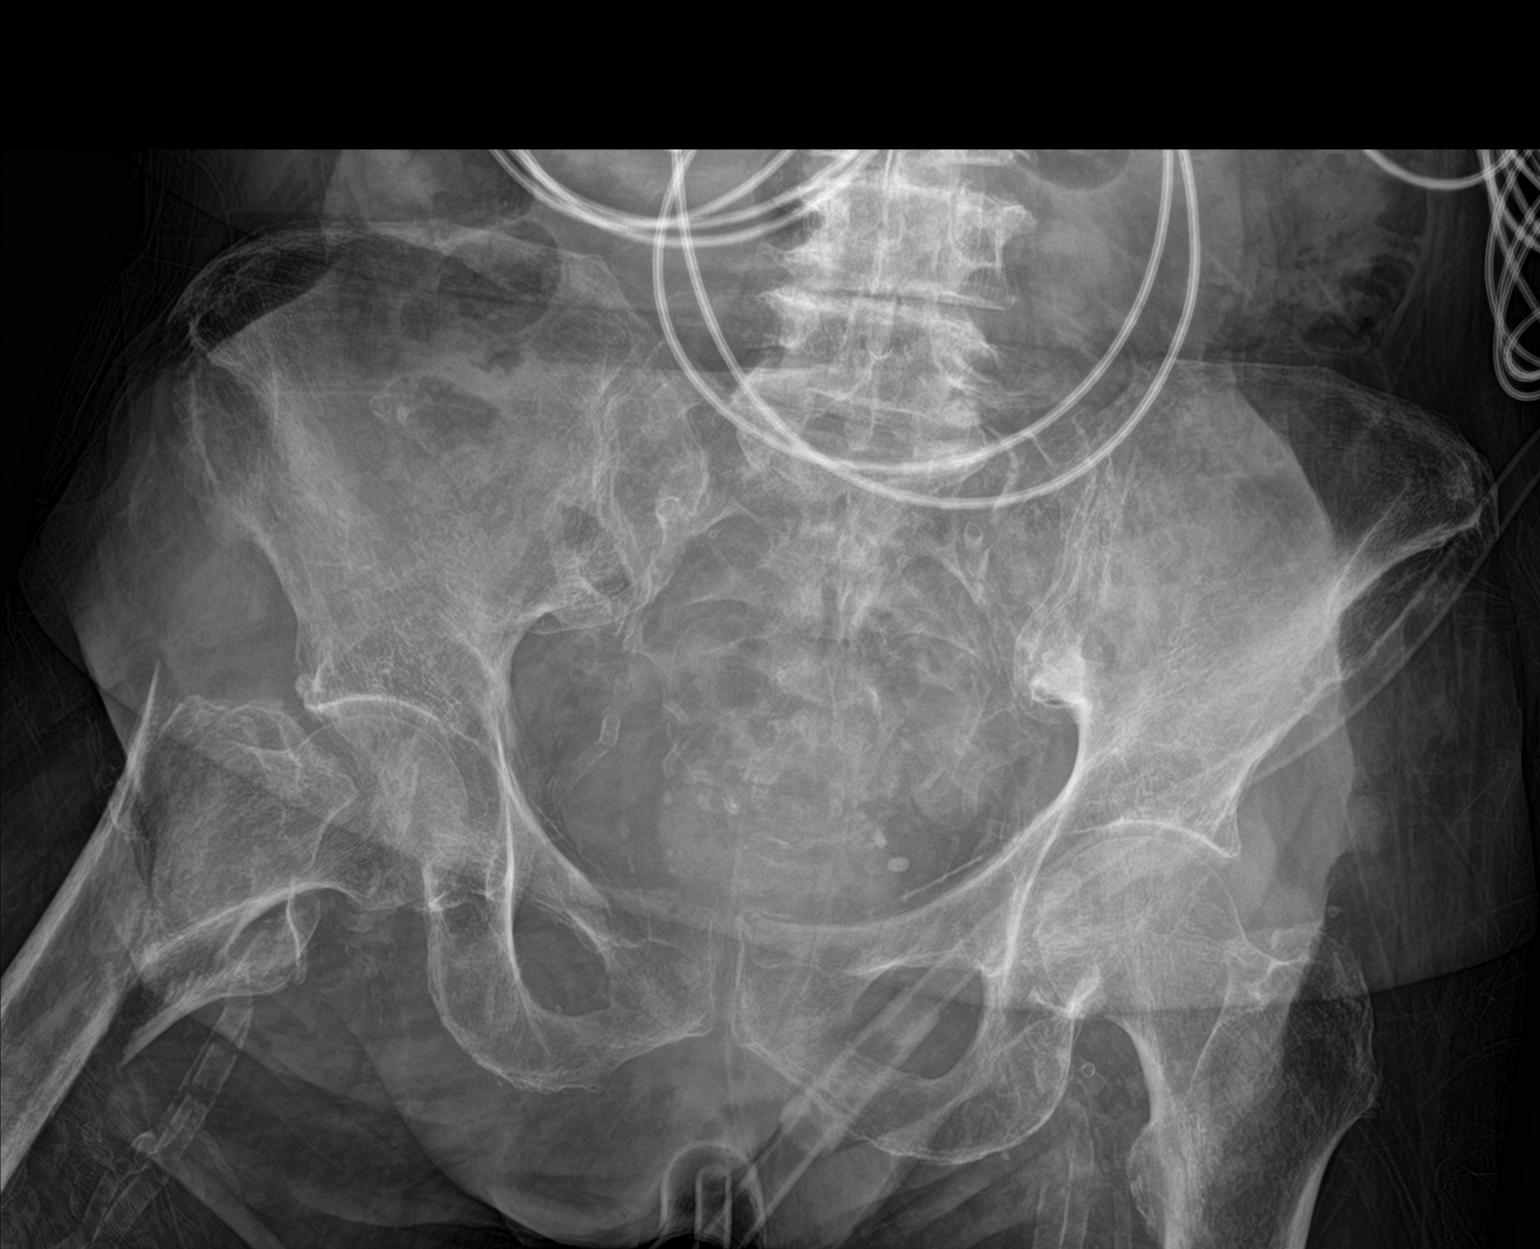

[hip ap]
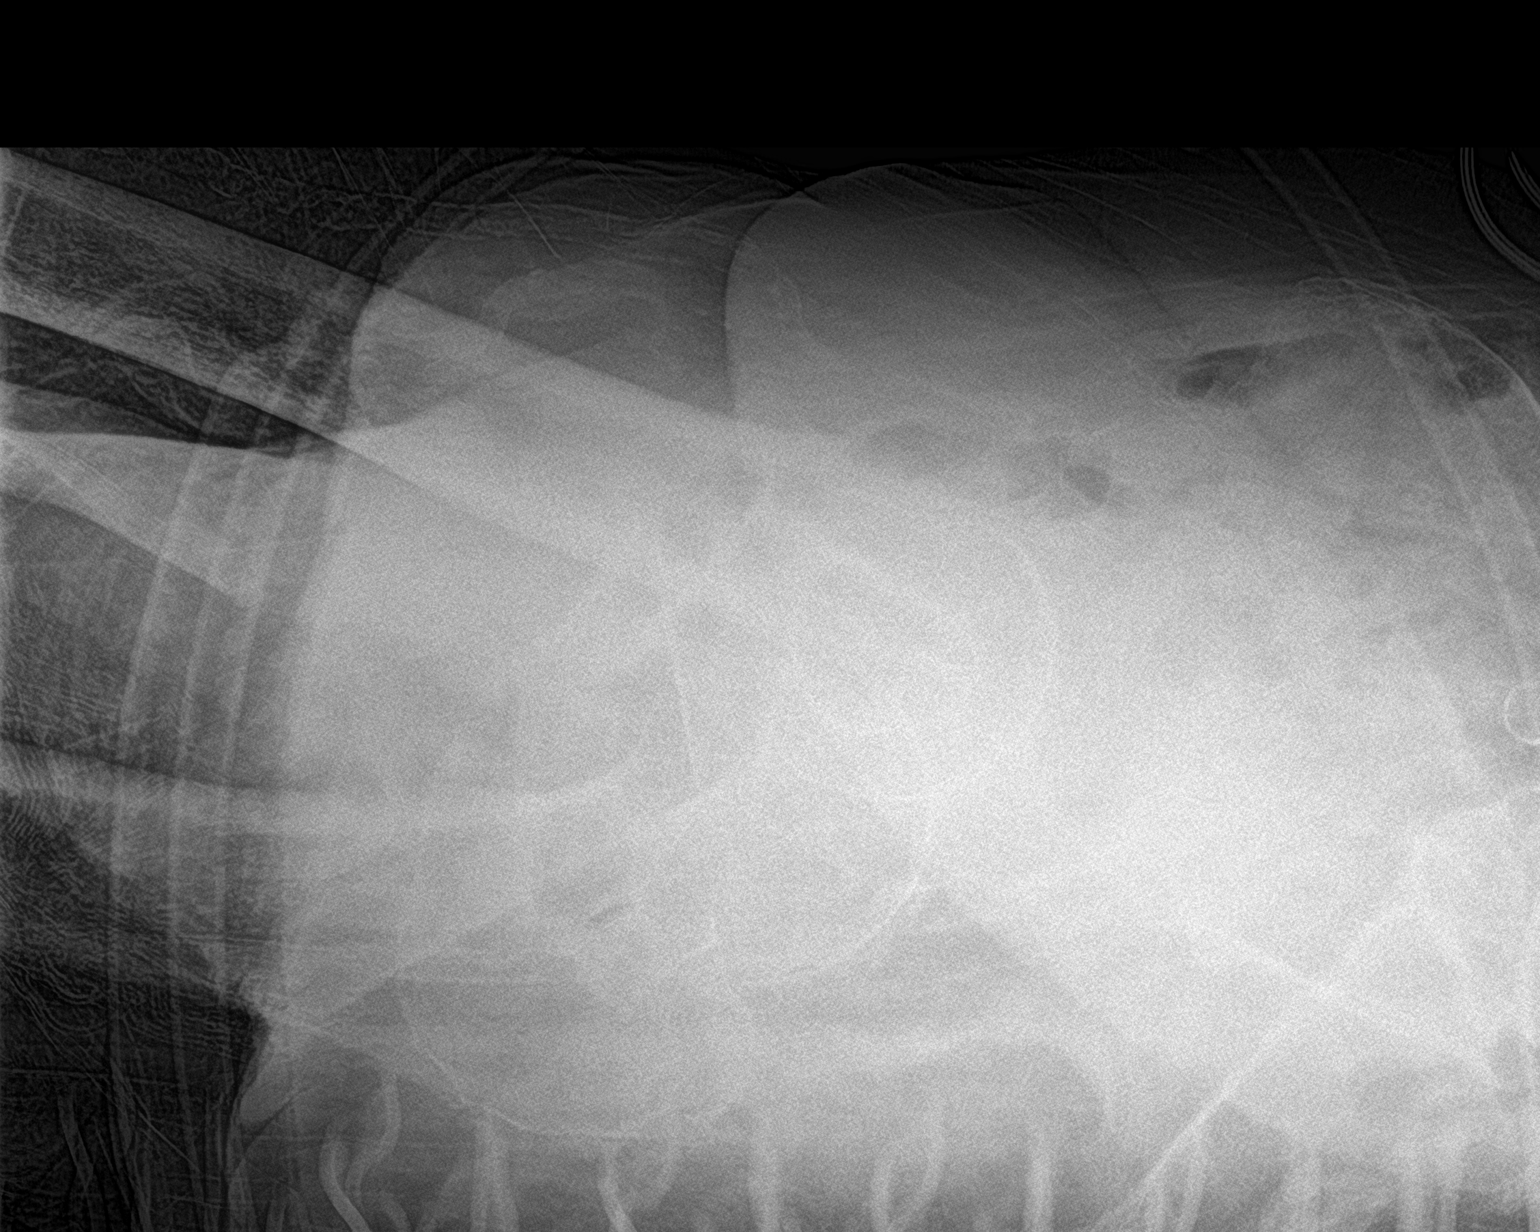

[hip lat]
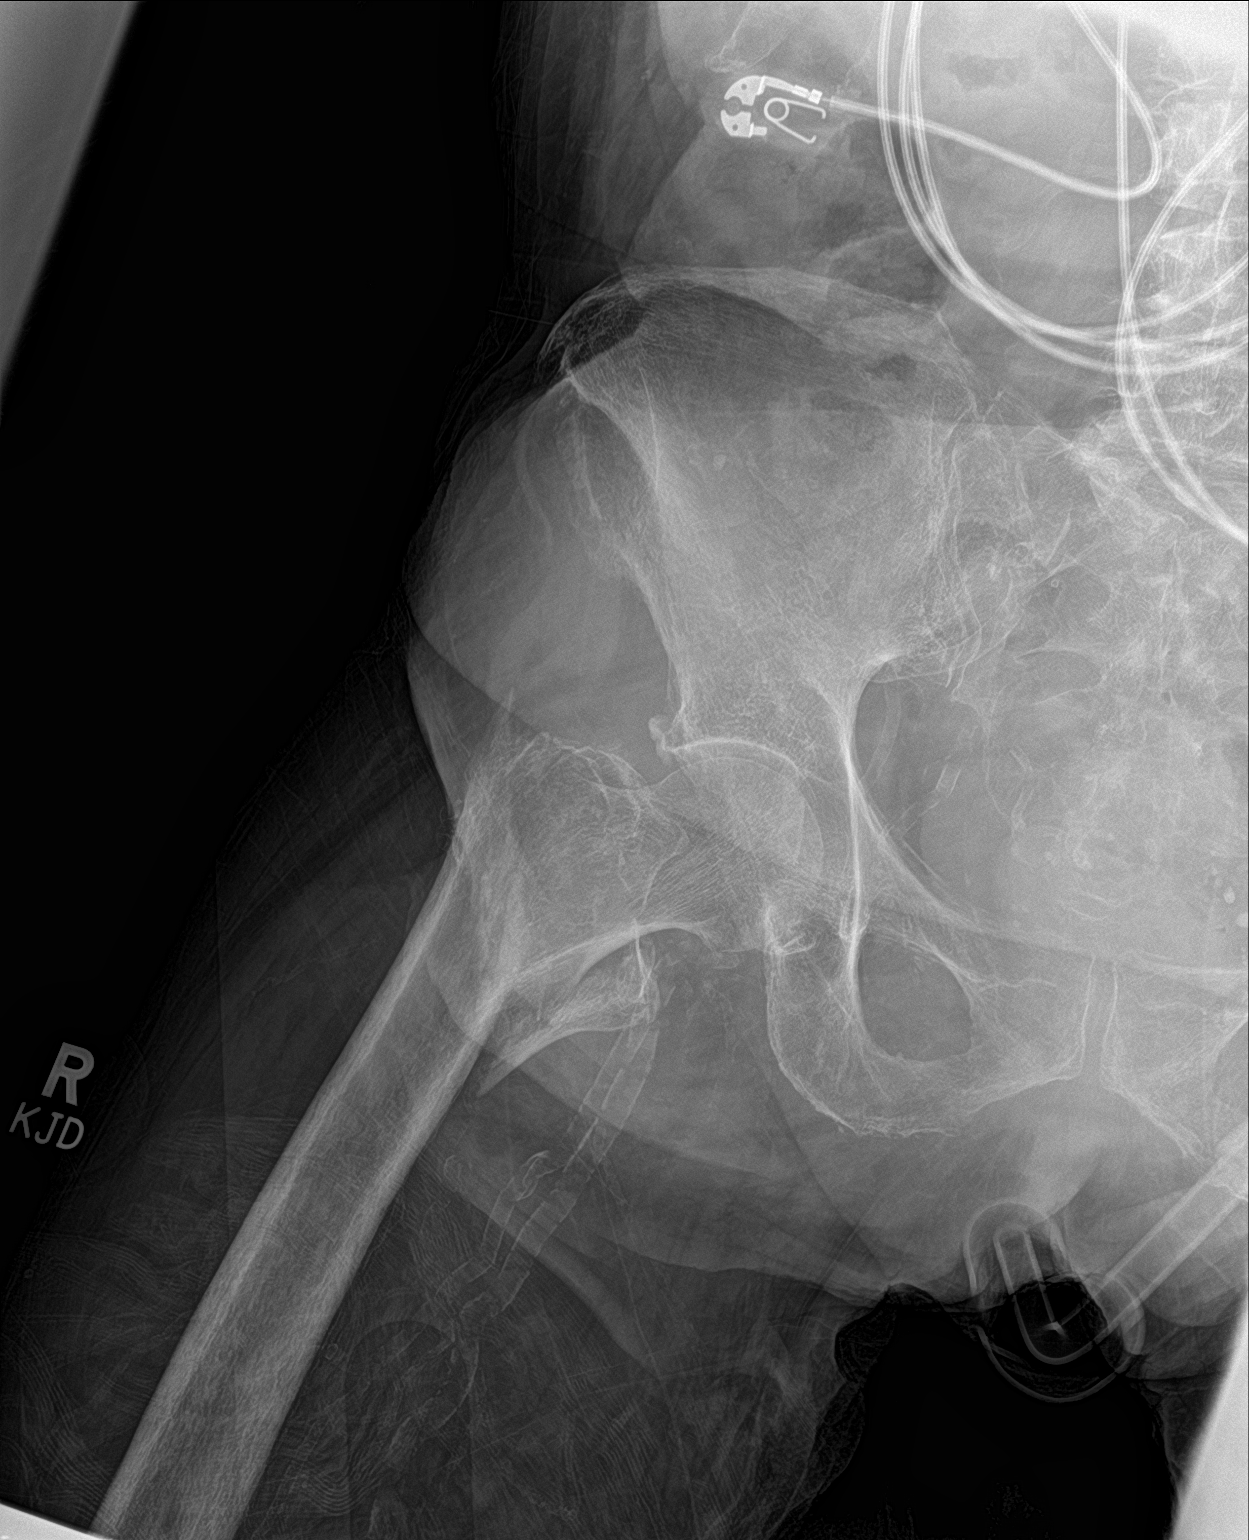

[3 of 3 positions shown; findings below may reference images not displayed]

FINDINGS: Acute comminuted intratrochanteric fracture of the right hip with
superior subluxation. Femoral head remains normally position within
the acetabulum. Femoral head height maintained. Remainder of the
bony pelvis grossly intact. Underlying severe osteopenia.

Moderate spondylosis noted within the lower lumbar spine.

No visible soft tissue injury. Prominent vascular calcifications
noted about the pelvis and proximal thighs.
IMPRESSION: Acute comminuted intertrochanteric fracture of the right hip.

## 2023-01-22 IMAGING — CR DG CHEST 1V
2 series · 2 of 2 positions shown · non-contrast
Comparison: Prior radiograph from 08/10/2020.

CLINICAL DATA: Initial evaluation for acute trauma, fall.

EXAM:
CHEST  1 VIEW

[chest ap (1 of 2)]
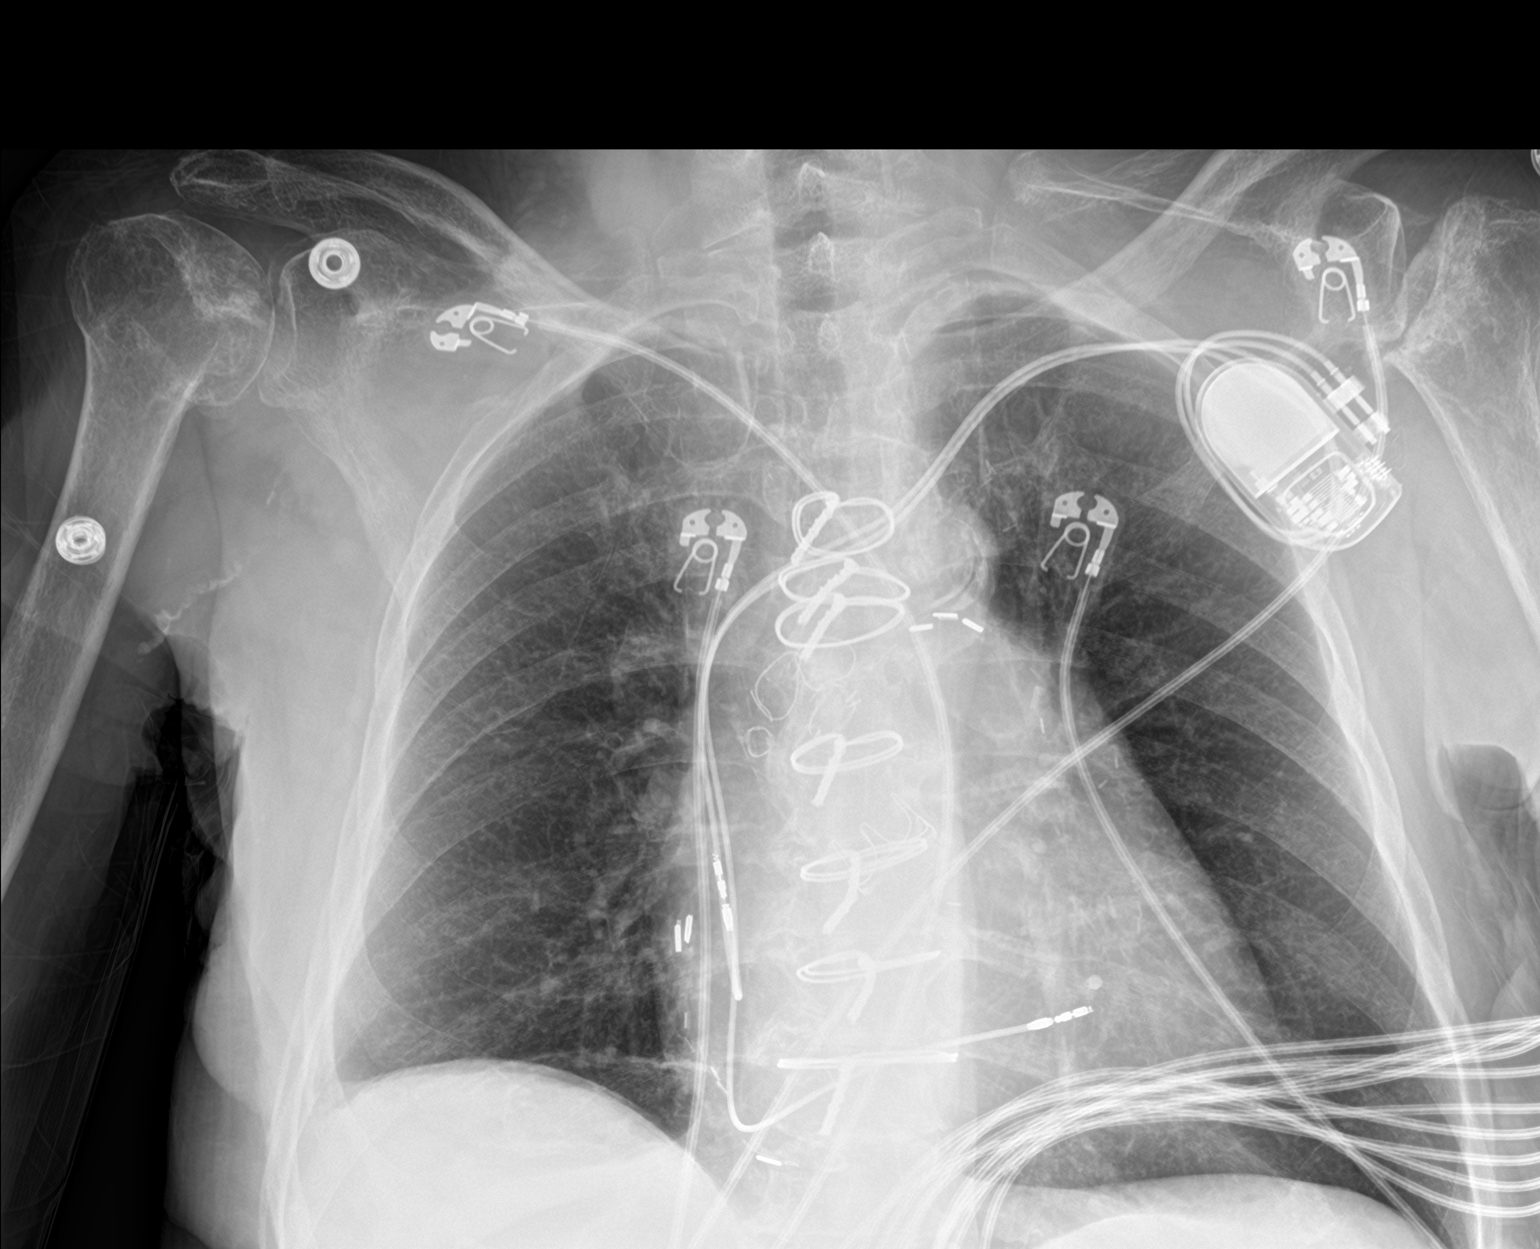

[chest ap (2 of 2)]
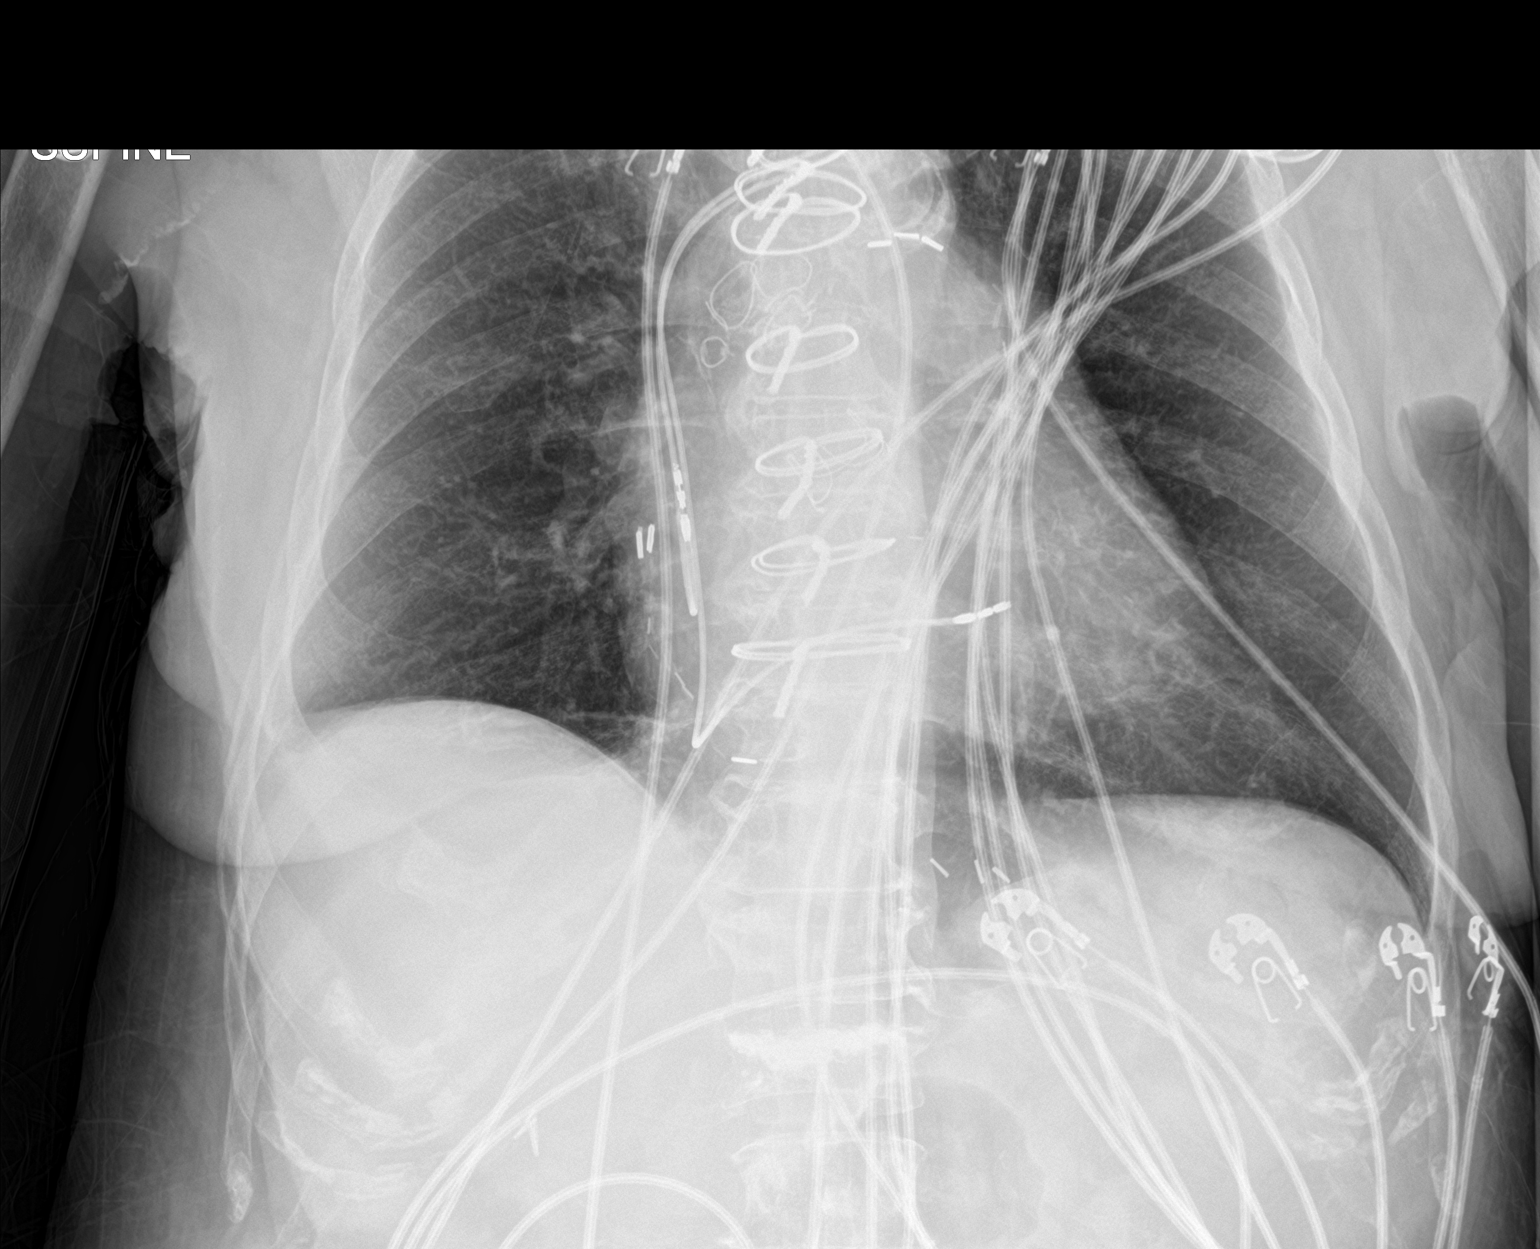

[2 of 2 positions shown; findings below may reference images not displayed]

FINDINGS: Left-sided pacemaker/AICD in place. Median sternotomy wires
underlying surgical clips and CABG markers noted. Valvular
replacement noted as well. Transverse heart size at the upper limits
of normal, stable. Mediastinal silhouette within normal limits.
Aortic atherosclerosis.

Lungs normally inflated. Mild linear subsegmental atelectasis
present at the right lung base. Blunting of the right costophrenic
angle suggestive of a trace right pleural effusion. No pulmonary
edema. No focal infiltrates. No pneumothorax.

Osteopenia. No definite acute osseous abnormality. Advanced
arthritic changes noted about the shoulders.
IMPRESSION: 1. Trace right pleural effusion with associated mild right basilar
subsegmental atelectasis.
2. No other active cardiopulmonary disease.
3.  Aortic Atherosclerosis ([id number]).

## 2023-01-27 IMAGING — CT CT HEAD W/O CM
4 series · 14 of 47 positions shown, 16 images · non-contrast
Comparison: CT head 04/26/2020

CLINICAL DATA: Mental status change

EXAM:
CT HEAD WITHOUT CONTRAST
TECHNIQUE: Contiguous axial images were obtained from the base of the skull
through the vertex without intravenous contrast.

[Series 3: head without · axial · non-contrast · 0.45mm/px · z∈[-554,-444]mm · 6 of 32 slices shown, 8 images]
[im 5/32  brain]
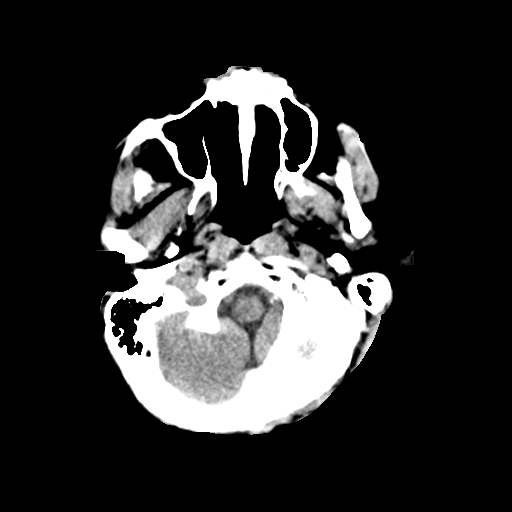
[im 5/32  bone]
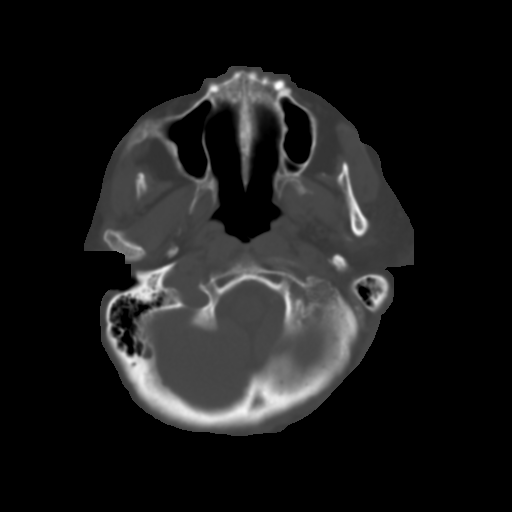
[im 9/32  brain]
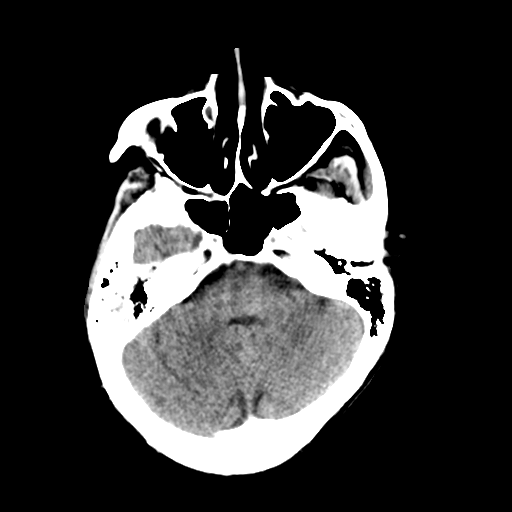
[im 14/32  brain]
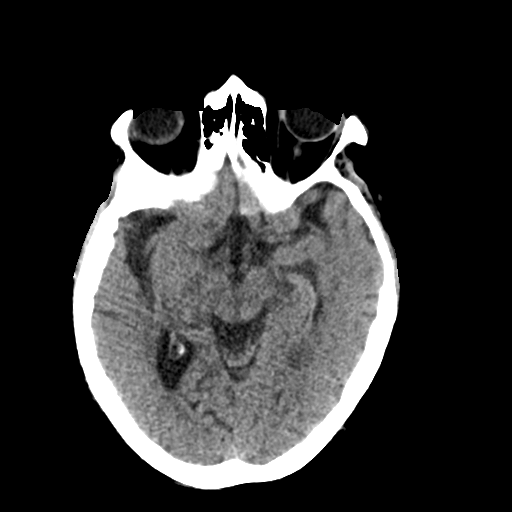
[im 18/32  brain]
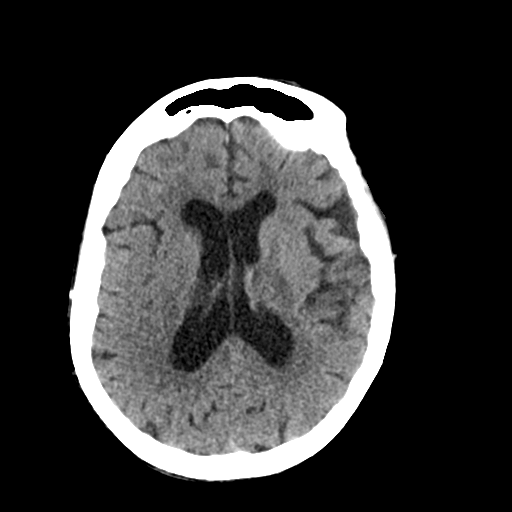
[im 23/32  brain]
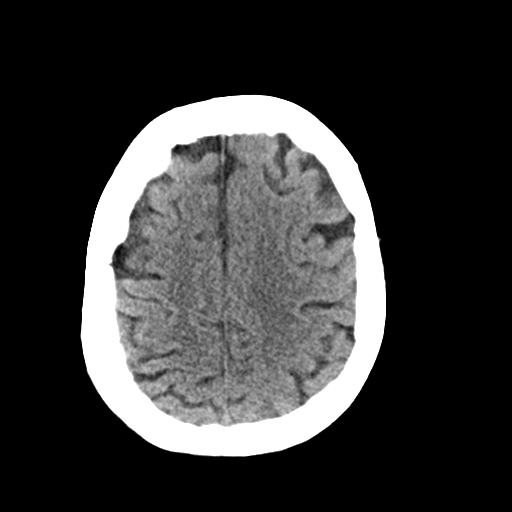
[im 23/32  bone]
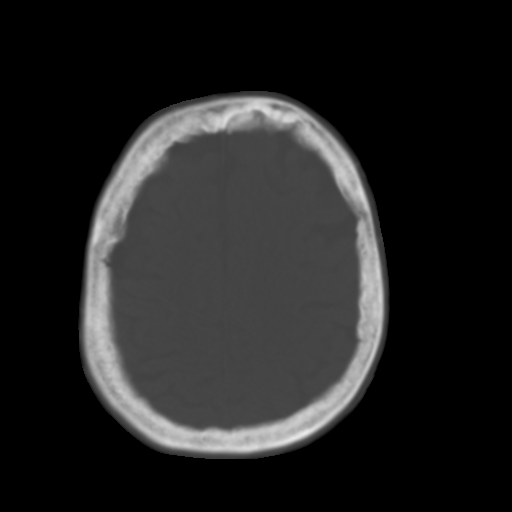
[im 27/32  brain]
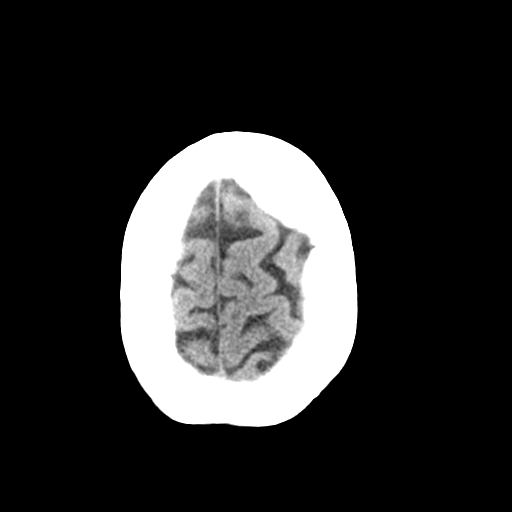

[Series 4: head bone · axial · 0.45mm/px · z∈[-560,-544]mm · 2 of 83 slices shown]
[im 8/83  bone]
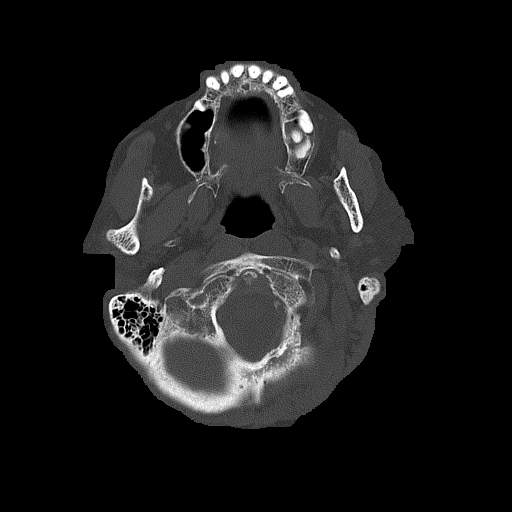
[im 16/83  bone]
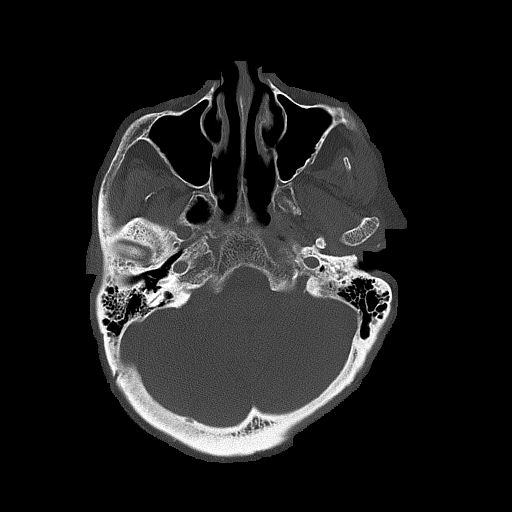

[Series 5: head without cor · coronal · non-contrast · 0.32mm/px · 3 of 67 slices shown]
[im 23/67  brain]
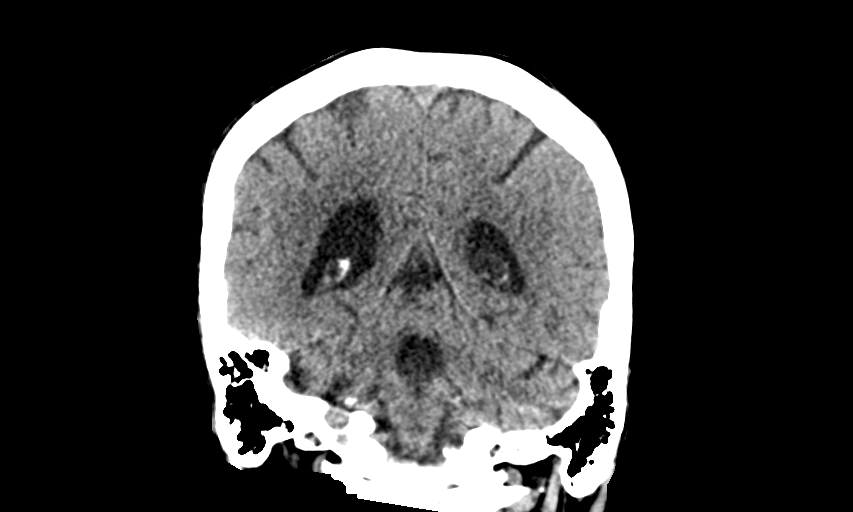
[im 30/67  brain]
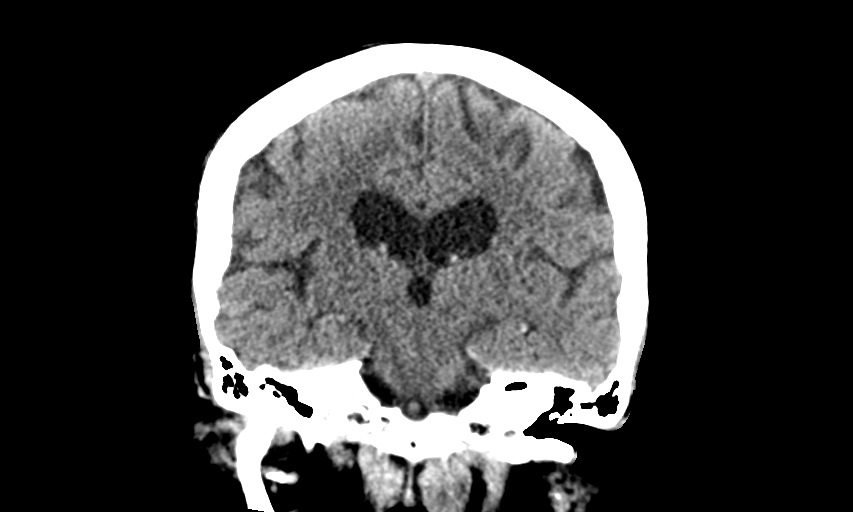
[im 37/67  brain]
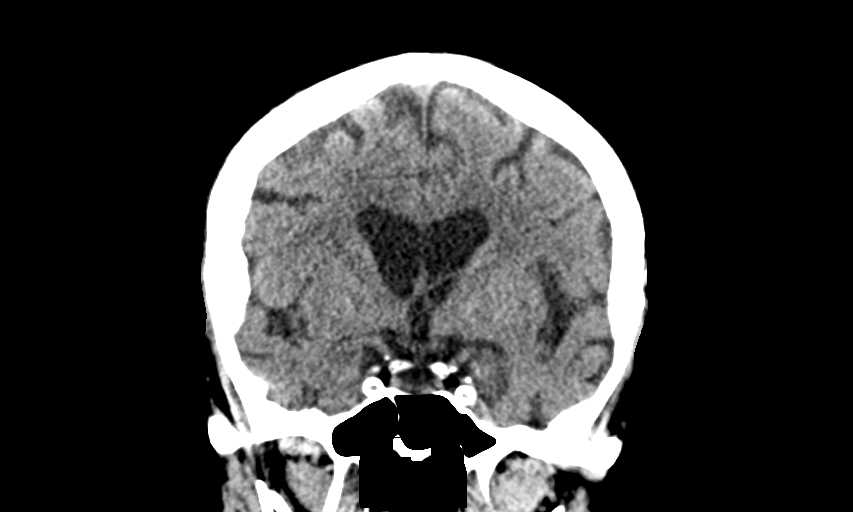

[Series 6: head without sag · sagittal · non-contrast · 0.32mm/px · 3 of 67 slices shown]
[im 23/67  brain]
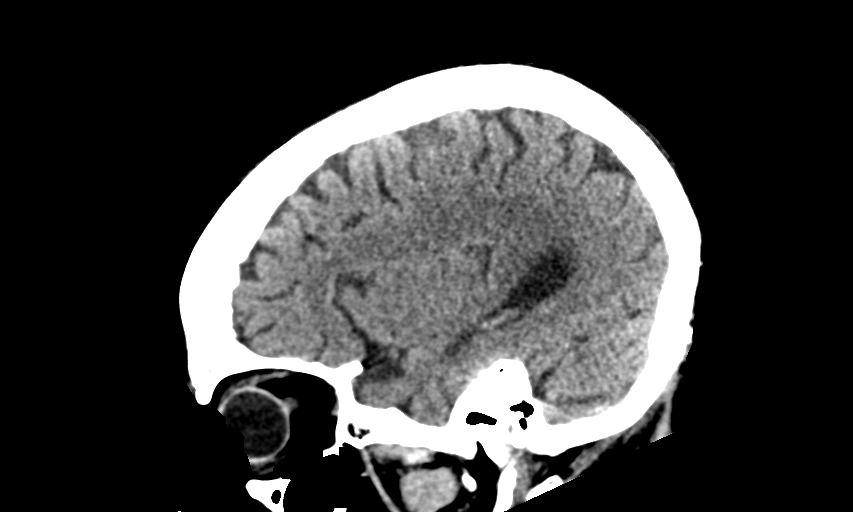
[im 34/67  brain]
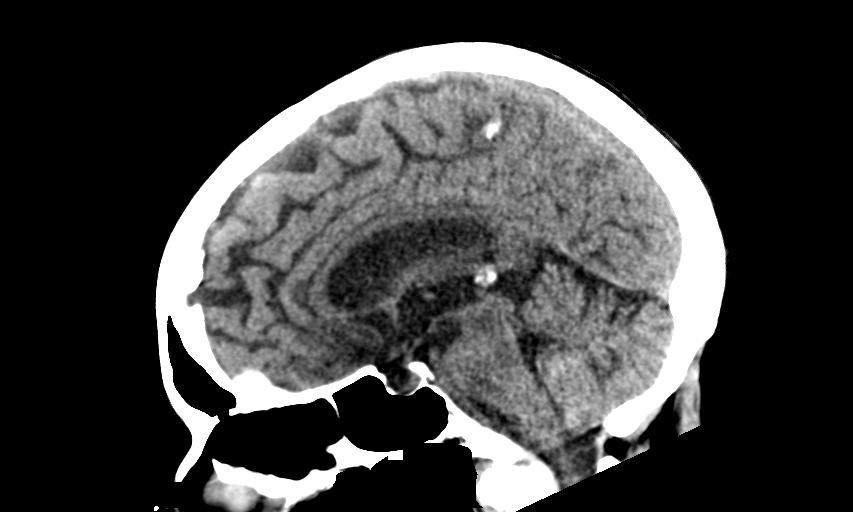
[im 45/67  brain]
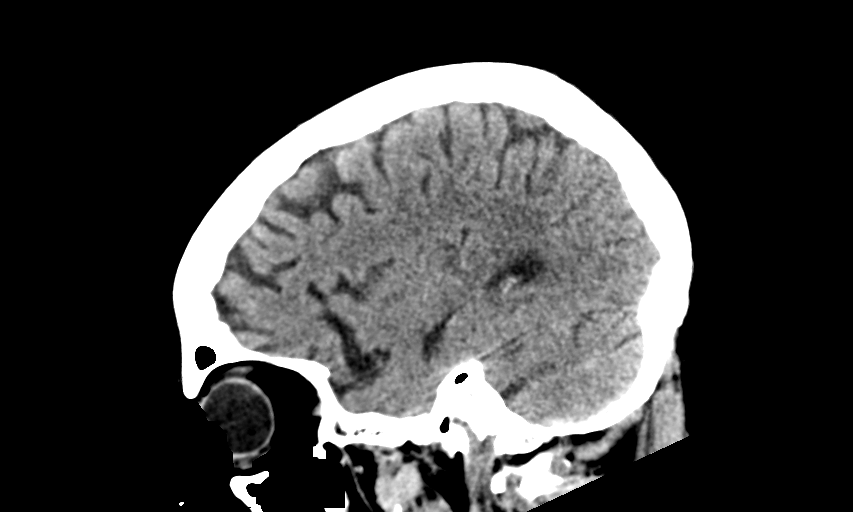

[14 of 47 positions shown; findings below may reference images not displayed]

FINDINGS: Brain: Mild cerebral atrophy without hydrocephalus. Mild white
matter hypoattenuation bilaterally.

Negative for acute infarct, hemorrhage, mass

Vascular: Negative for hyperdense vessel

Skull: Negative

Sinuses/Orbits: Paranasal sinuses clear. Bilateral cataract
extraction

Other: None
IMPRESSION: No acute abnormality. Mild atrophy and mild chronic microvascular
ischemic change.
